# Patient Record
Sex: Male | Born: 1960
Health system: Southern US, Community
[De-identification: ages and names within clinical notes are randomized; demographics above are authoritative.]

## PROBLEM LIST (undated history)

## (undated) DIAGNOSIS — M1990 Unspecified osteoarthritis, unspecified site: Secondary | ICD-10-CM

## (undated) DIAGNOSIS — J441 Chronic obstructive pulmonary disease with (acute) exacerbation: Secondary | ICD-10-CM

## (undated) DIAGNOSIS — M898X9 Other specified disorders of bone, unspecified site: Secondary | ICD-10-CM

## (undated) DIAGNOSIS — F1410 Cocaine abuse, uncomplicated: Secondary | ICD-10-CM

## (undated) DIAGNOSIS — Z992 Dependence on renal dialysis: Secondary | ICD-10-CM

## (undated) DIAGNOSIS — D126 Benign neoplasm of colon, unspecified: Secondary | ICD-10-CM

## (undated) DIAGNOSIS — K626 Ulcer of anus and rectum: Secondary | ICD-10-CM

## (undated) DIAGNOSIS — B1920 Unspecified viral hepatitis C without hepatic coma: Secondary | ICD-10-CM

## (undated) DIAGNOSIS — D649 Anemia, unspecified: Secondary | ICD-10-CM

## (undated) DIAGNOSIS — N19 Unspecified kidney failure: Secondary | ICD-10-CM

## (undated) DIAGNOSIS — I499 Cardiac arrhythmia, unspecified: Secondary | ICD-10-CM

## (undated) DIAGNOSIS — K649 Unspecified hemorrhoids: Secondary | ICD-10-CM

## (undated) DIAGNOSIS — I700 Atherosclerosis of aorta: Secondary | ICD-10-CM

## (undated) DIAGNOSIS — E889 Metabolic disorder, unspecified: Secondary | ICD-10-CM

## (undated) DIAGNOSIS — N186 End stage renal disease: Secondary | ICD-10-CM

## (undated) DIAGNOSIS — B181 Chronic viral hepatitis B without delta-agent: Secondary | ICD-10-CM

## (undated) DIAGNOSIS — I517 Cardiomegaly: Secondary | ICD-10-CM

## (undated) DIAGNOSIS — J449 Chronic obstructive pulmonary disease, unspecified: Secondary | ICD-10-CM

## (undated) DIAGNOSIS — K219 Gastro-esophageal reflux disease without esophagitis: Secondary | ICD-10-CM

## (undated) DIAGNOSIS — I050 Rheumatic mitral stenosis: Secondary | ICD-10-CM

## (undated) DIAGNOSIS — J811 Chronic pulmonary edema: Secondary | ICD-10-CM

## (undated) DIAGNOSIS — F419 Anxiety disorder, unspecified: Secondary | ICD-10-CM

## (undated) DIAGNOSIS — Z87442 Personal history of urinary calculi: Secondary | ICD-10-CM

## (undated) DIAGNOSIS — R079 Chest pain, unspecified: Secondary | ICD-10-CM

## (undated) DIAGNOSIS — I2510 Atherosclerotic heart disease of native coronary artery without angina pectoris: Secondary | ICD-10-CM

## (undated) DIAGNOSIS — M9080 Osteopathy in diseases classified elsewhere, unspecified site: Secondary | ICD-10-CM

## (undated) DIAGNOSIS — N289 Disorder of kidney and ureter, unspecified: Secondary | ICD-10-CM

## (undated) DIAGNOSIS — I10 Essential (primary) hypertension: Secondary | ICD-10-CM

## (undated) DIAGNOSIS — B1910 Unspecified viral hepatitis B without hepatic coma: Secondary | ICD-10-CM

## (undated) DIAGNOSIS — I219 Acute myocardial infarction, unspecified: Secondary | ICD-10-CM

## (undated) DIAGNOSIS — J189 Pneumonia, unspecified organism: Secondary | ICD-10-CM

## (undated) DIAGNOSIS — E875 Hyperkalemia: Secondary | ICD-10-CM

## (undated) HISTORY — DX: Other specified disorders of bone, unspecified site: M89.8X9

## (undated) HISTORY — DX: Chronic pulmonary edema: J81.1

## (undated) HISTORY — DX: Osteopathy in diseases classified elsewhere, unspecified site: M90.80

## (undated) HISTORY — DX: Ulcer of anus and rectum: K62.6

## (undated) HISTORY — DX: Chronic viral hepatitis B without delta-agent: B18.1

## (undated) HISTORY — DX: Essential (primary) hypertension: I10

## (undated) HISTORY — DX: Hyperkalemia: E87.5

## (undated) HISTORY — DX: Cocaine abuse, uncomplicated: F14.10

## (undated) HISTORY — DX: Metabolic disorder, unspecified: E88.9

## (undated) HISTORY — DX: Benign neoplasm of colon, unspecified: D12.6

## (undated) HISTORY — DX: Anemia, unspecified: D64.9

## (undated) HISTORY — PX: SKIN GRAFT SPLIT THICKNESS LEG / FOOT: SUR1303

## (undated) HISTORY — PX: COLONOSCOPY: SHX174

## (undated) HISTORY — DX: Unspecified osteoarthritis, unspecified site: M19.90

## (undated) HISTORY — DX: Chest pain, unspecified: R07.9

## (undated) HISTORY — DX: Atherosclerosis of aorta: I70.0

## (undated) HISTORY — PX: HEMORRHOID BANDING: SHX5850

## (undated) HISTORY — PX: POLYPECTOMY: SHX149

## (undated) HISTORY — DX: Unspecified hemorrhoids: K64.9

## (undated) HISTORY — DX: Unspecified viral hepatitis C without hepatic coma: B19.20

## (undated) HISTORY — DX: Unspecified kidney failure: N19

## (undated) HISTORY — DX: Cardiomegaly: I51.7

## (undated) NOTE — *Deleted (*Deleted)
[facility] Discharge Summary  Patient name: [name] Medical record number: [id number] Date of birth: 04/23/1961 Age: 87 y.o. Gender: male Date of Admission: 05/19/2020  Date of Discharge: 05/22/20 Admitting Physician: Wilber Oliphant, MD  Primary Care Provider: Patient, No Pcp Per Consultants: Nephro  Indication for Hospitalization: Dyspnea, Hyperkalemia  Discharge Diagnoses/Problem List:  ESRD on HD M/W/F, PAF with RVR, HTN, HLD, pulmonary HTN, COPD, tobacco use, Hep B/C, Cirrhosis with recurrent ascites, CAD, anemia of chronic disease and dietary/medication noncompliance.  Disposition: Able to be discharged home safely  Discharge Condition: Stable  Discharge Exam:   Physical Exam Constitutional:      Appearance: Normal appearance.  HENT:     Head: Normocephalic and atraumatic.  Cardiovascular:     Rate and Rhythm: Normal rate and regular rhythm.     Pulses: Normal pulses.  Pulmonary:     Effort: Pulmonary effort is normal.     Breath sounds: Normal breath sounds.  Abdominal:     General: Abdomen is flat. Bowel sounds are normal. There is no distension.     Palpations: Abdomen is soft.     Tenderness: There is no abdominal tenderness.  Musculoskeletal:     Right lower leg: No edema.     Left lower leg: No edema.  Skin:    General: Skin is warm.  Neurological:     General: No focal deficit present.     Mental Status: He is alert.     Brief Hospital Course:  [name]  is a 45 y.o. male presenting with shortness of breath and found to have hyperkalemia with EKG changes. PMH is significant for ESRD on HD M/W/F, PAF with RVR, HTN, HLD, pulmonary HTN, COPD, tobacco use, Hep B/C, Cirrhosis with recurrent ascites, CAD, anemia of chronic disease and dietary/medication noncompliance.  Hyperkalemia with EKG changes  Hx of ESRD on HD M/W/F Initially had K of 6.9.  Evidence of Peaked T waves on EKG.  Had missed/left early  few dialysis appointments.  He was given calcium chloride 1g in the ED as well as insulin/dextrose.  Hyperkalemia resolved after first Dialysis treatment and remained in normal range throughout rest of hospital stay.  Was normal on time of discharge, and T waves less peaked by time of discharge.  Cirrhosis with recurrent ascites Patient has history of Chronic Hepatitis B.  Has significant ascites and received therapeutic Paracentesis.  Patient on Prophylactic daily Bactrim and was changed to Cefdenir MWF after Dialysis at the recommendation of Pharm given Bactrim's potential for Hyperkalemia and QT prolongation and Cefdenir's coverage profile. Patient had therapeutic paracentesis while hospitalized where they removed 3.8 L of fluid.  No Perotineal signs during hospital stay.  Dyspnea  Hx of COPD, Tobacco Use Patint initally came in complaining of dyspnea.  Chest X-Ray obtained that showed mild vascular congestion.  O2 Sats remained > 90 on RA.  Treated with Albuterol 2 puffs q4hr PRN.  Dyspnea resolved with first dialysis treatment.  Paroxysmal A-Fib with RVR Patient initially came in with A-Fib with RVR after missing a few doses of his Metoprolol, Amiodarone and Diltiazem.  Was restarted on medications, but continued to be tachycardic for a day after dialysis.  A CTA was obtained due to rule out PE as possible cause for continued tachycardia. By 11/11 EKG was obtained showing rate-controlled Atrial Fibrillation.   Issues for Follow Up:  1. SBP Prophylaxis- Changed anitbiotics from Bactrim to Cefdenir MWF after Dialysis [facility] Course)  2. Paracentesis-  F/u up with PCP provider to schedule future therapeutic paracenteses appointnments.   Significant Procedures: Hemodialysis, Paracentesis  Significant Labs and Imaging:  Recent Labs  Lab 05/20/20 0914 05/21/20 1643 05/22/20 0838  WBC 7.5 7.2 6.4  HGB 10.4* 10.9* 10.7*  HCT 30.6* 31.5* 30.7*  PLT 126* 147* 121*   Recent Labs  Lab  05/19/20 0934 05/19/20 0934 05/19/20 1750 05/19/20 1750 05/20/20 0226 05/20/20 0226 05/21/20 0834 05/21/20 0834 05/21/20 1643 05/22/20 0838  NA 136   < > 133*  --  135  --  133*  --  136 135  K 6.9*   < > 6.6*   < > 4.6   < > 4.9   < > 3.8 4.3  CL 96*   < > 95*  --  94*  --  94*  --  95* 96*  CO2 26   < > 24  --  27  --  25  --  28 27  GLUCOSE 86   < > 103*  --  75  --  127*  --  102* 106*  BUN 50*   < > 56*  --  28*  --  46*  --  15 24*  CREATININE 9.54*   < > 9.88*  --  6.07*  --  8.60*  --  4.31* 5.98*  CALCIUM 10.4*   < > 10.6*  --  10.0  --  9.9  --  9.7 9.2  MG 2.6*  --   --   --   --   --   --   --   --   --   PHOS  --   --   --   --   --   --   --   --   --  3.9  ALKPHOS 243*  --   --   --   --   --   --   --   --   --   AST 50*  --   --   --   --   --   --   --   --   --   ALT 32  --   --   --   --   --   --   --   --   --   ALBUMIN 3.6  --   --   --   --   --   --   --   --  3.2*   < > = values in this interval not displayed.     Results/Tests Pending at Time of Discharge: Paracentesis cultures  Discharge Medications:  Allergies as of 05/22/2020      Reactions   Tramadol Itching, Other (See Comments)   Grass Extracts [gramineae Pollens]    Other reaction(s): Sneezing   Morphine And Related Other (See Comments)   Stomach pain   Pollen Extract Other (See Comments)   Other reaction(s): Sneezing (finding)   Acetaminophen Nausea Only   Stomach ache   Aspirin Itching, Other (See Comments)   STOMACH PAIN   Clonidine Derivatives Itching      Medication List    STOP taking these medications   sulfamethoxazole-trimethoprim 800-160 MG tablet Commonly known as: BACTRIM DS     TAKE these medications   albuterol 108 (90 Base) MCG/ACT inhaler Commonly known as: VENTOLIN HFA Inhale 2 puffs into the lungs every 4 (four) hours as needed for wheezing or shortness of  breath.   amiodarone 200 MG tablet Commonly known as: PACERONE Take 1 tablet (200 mg total) by  mouth daily.   cefdinir 300 MG capsule Commonly known as: OMNICEF Take 1 capsule (300 mg total) by mouth every Monday, Wednesday, and Friday at 6 PM. Start taking on: May 23, 2020 Notes to patient: **NEW** To prevent infection in the belly. Take AFTER DIALYSIS. Replaces Bactrim- stop taking Bactrim.     diltiazem 120 MG 24 hr capsule Commonly known as: CARDIZEM CD Take 1 capsule (120 mg total) by mouth daily.   diphenhydrAMINE 25 mg capsule Commonly known as: BENADRYL Take 1 capsule (25 mg total) by mouth every 6 (six) hours as needed for itching or allergies.   gabapentin 300 MG capsule Commonly known as: NEURONTIN Take 300 mg by mouth in the morning and at bedtime.   metoprolol succinate 25 MG 24 hr tablet Commonly known as: Toprol XL Take 1 tablet (25 mg total) by mouth daily.   ondansetron 4 MG tablet Commonly known as: Zofran Take 1 tablet (4 mg total) by mouth every 8 (eight) hours as needed for nausea or vomiting.   Oxycodone HCl 10 MG Tabs Take 10 mg by mouth 3 (three) times daily as needed (pain).   pantoprazole 40 MG tablet Commonly known as: PROTONIX Take 1 tablet (40 mg total) by mouth 2 (two) times daily.   sevelamer carbonate 800 MG tablet Commonly known as: RENVELA Take 2,400 mg by mouth See admin instructions. Take 3 tablets (2400 mg) by mouth up to three times daily with meals   tenofovir 300 MG tablet Commonly known as: Viread Take 1 by mouth every Monday. What changed:   how much to take  how to take this  when to take this  additional instructions            Durable Medical Equipment  (From admission, onward)         Start     Ordered   05/22/20 1233  For home use only DME 3 n 1  Once        05/22/20 1232          Discharge Instructions: Please refer to Patient Instructions section of EMR for full details.  Patient was counseled important signs and symptoms that should prompt return to medical care, changes in medications,  dietary instructions, activity restrictions, and follow up appointments.   Follow-Up Appointments:     Delora Fuel, MD 05/22/2020, 1:09 PM PGY-1, [place]

---

## 1998-01-31 ENCOUNTER — Emergency Department (HOSPITAL_COMMUNITY): Admission: EM | Admit: 1998-01-31 | Discharge: 1998-01-31 | Payer: Self-pay | Admitting: Emergency Medicine

## 1999-02-03 ENCOUNTER — Emergency Department (HOSPITAL_COMMUNITY): Admission: EM | Admit: 1999-02-03 | Discharge: 1999-02-03 | Payer: Self-pay | Admitting: Emergency Medicine

## 1999-11-26 ENCOUNTER — Encounter: Payer: Self-pay | Admitting: Emergency Medicine

## 1999-11-26 ENCOUNTER — Emergency Department (HOSPITAL_COMMUNITY): Admission: EM | Admit: 1999-11-26 | Discharge: 1999-11-26 | Payer: Self-pay | Admitting: Emergency Medicine

## 1999-12-25 ENCOUNTER — Emergency Department (HOSPITAL_COMMUNITY): Admission: EM | Admit: 1999-12-25 | Discharge: 1999-12-25 | Payer: Self-pay | Admitting: Emergency Medicine

## 2000-06-27 ENCOUNTER — Emergency Department (HOSPITAL_COMMUNITY): Admission: EM | Admit: 2000-06-27 | Discharge: 2000-06-27 | Payer: Self-pay | Admitting: Emergency Medicine

## 2000-06-27 ENCOUNTER — Encounter: Payer: Self-pay | Admitting: Emergency Medicine

## 2002-08-22 ENCOUNTER — Encounter: Payer: Self-pay | Admitting: Emergency Medicine

## 2002-08-22 ENCOUNTER — Emergency Department (HOSPITAL_COMMUNITY): Admission: EM | Admit: 2002-08-22 | Discharge: 2002-08-22 | Payer: Self-pay | Admitting: Emergency Medicine

## 2005-06-19 ENCOUNTER — Emergency Department (HOSPITAL_COMMUNITY): Admission: EM | Admit: 2005-06-19 | Discharge: 2005-06-19 | Payer: Self-pay | Admitting: Emergency Medicine

## 2005-07-06 ENCOUNTER — Emergency Department (HOSPITAL_COMMUNITY): Admission: EM | Admit: 2005-07-06 | Discharge: 2005-07-06 | Payer: Self-pay | Admitting: *Deleted

## 2006-12-19 IMAGING — CR DG HAND COMPLETE 3+V*R*
3 series · 3 of 3 positions shown · non-contrast
Comparison: None.

CLINICAL DATA: Laceration to right thumb with chainsaw.
 RIGHT HAND - 3 VIEW:

[x hand pa right]
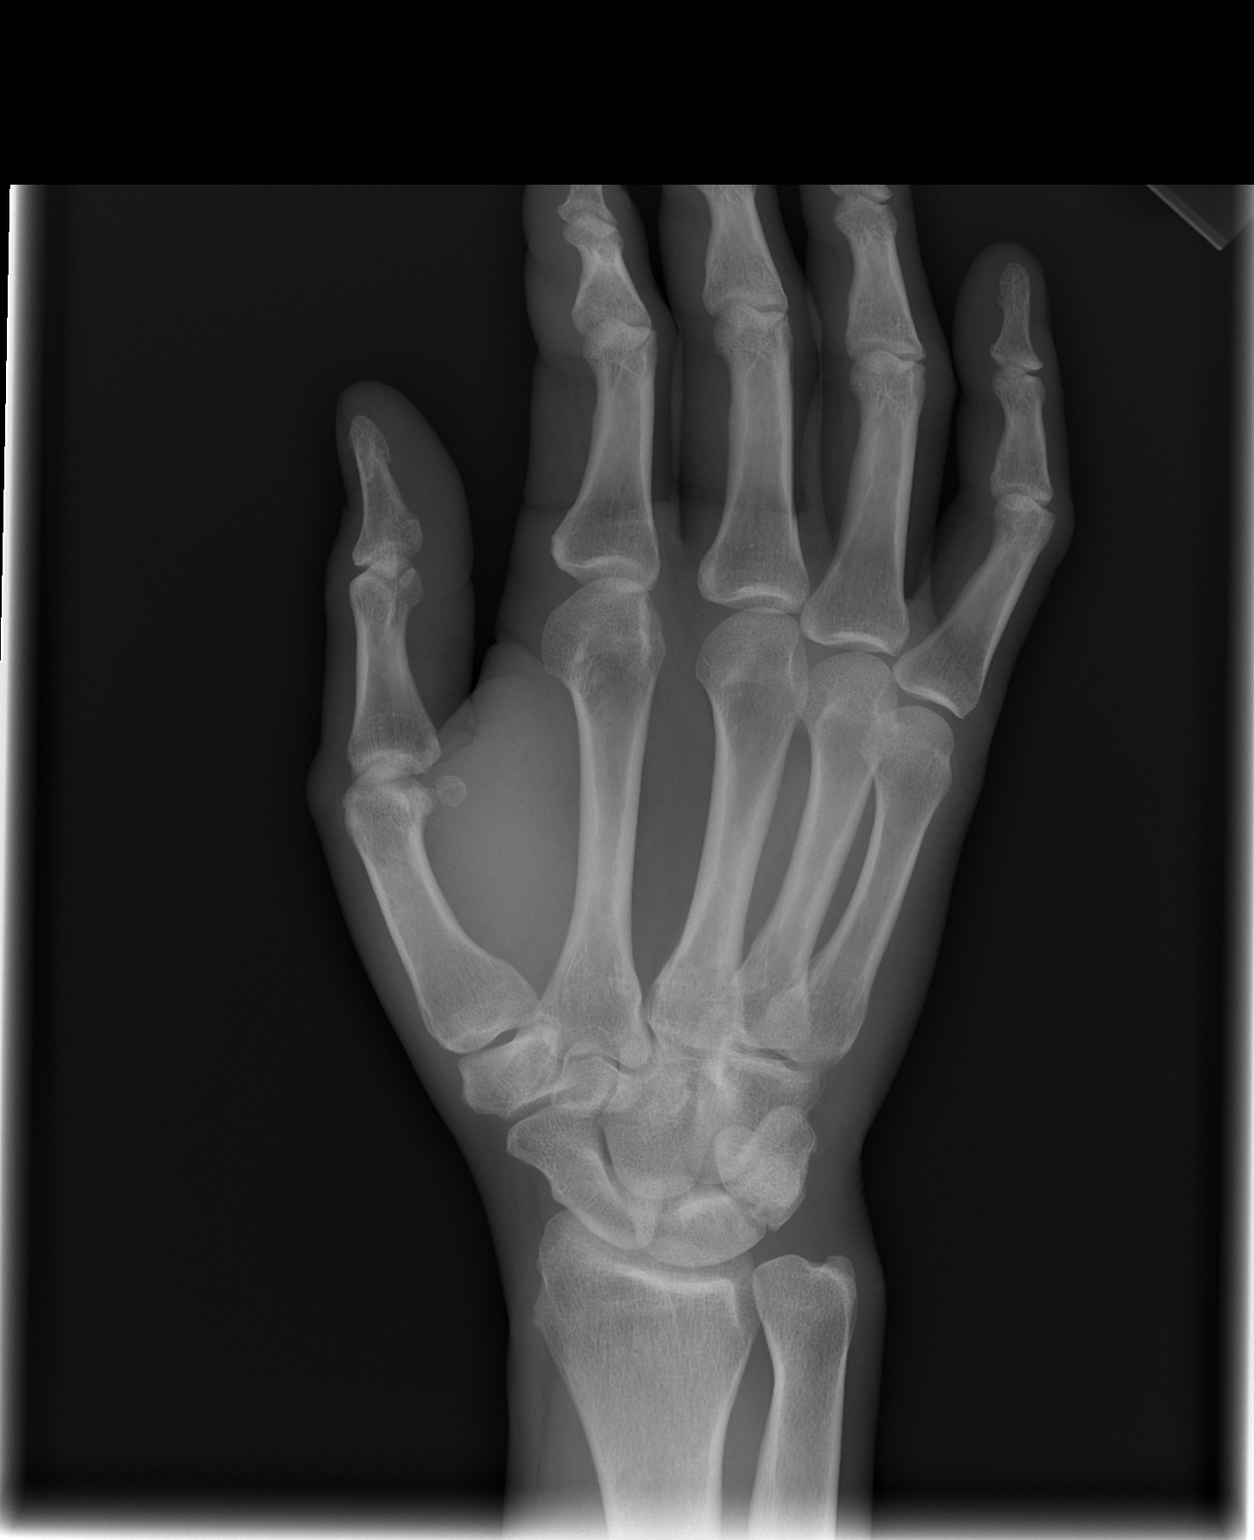

[x hand oblique right]
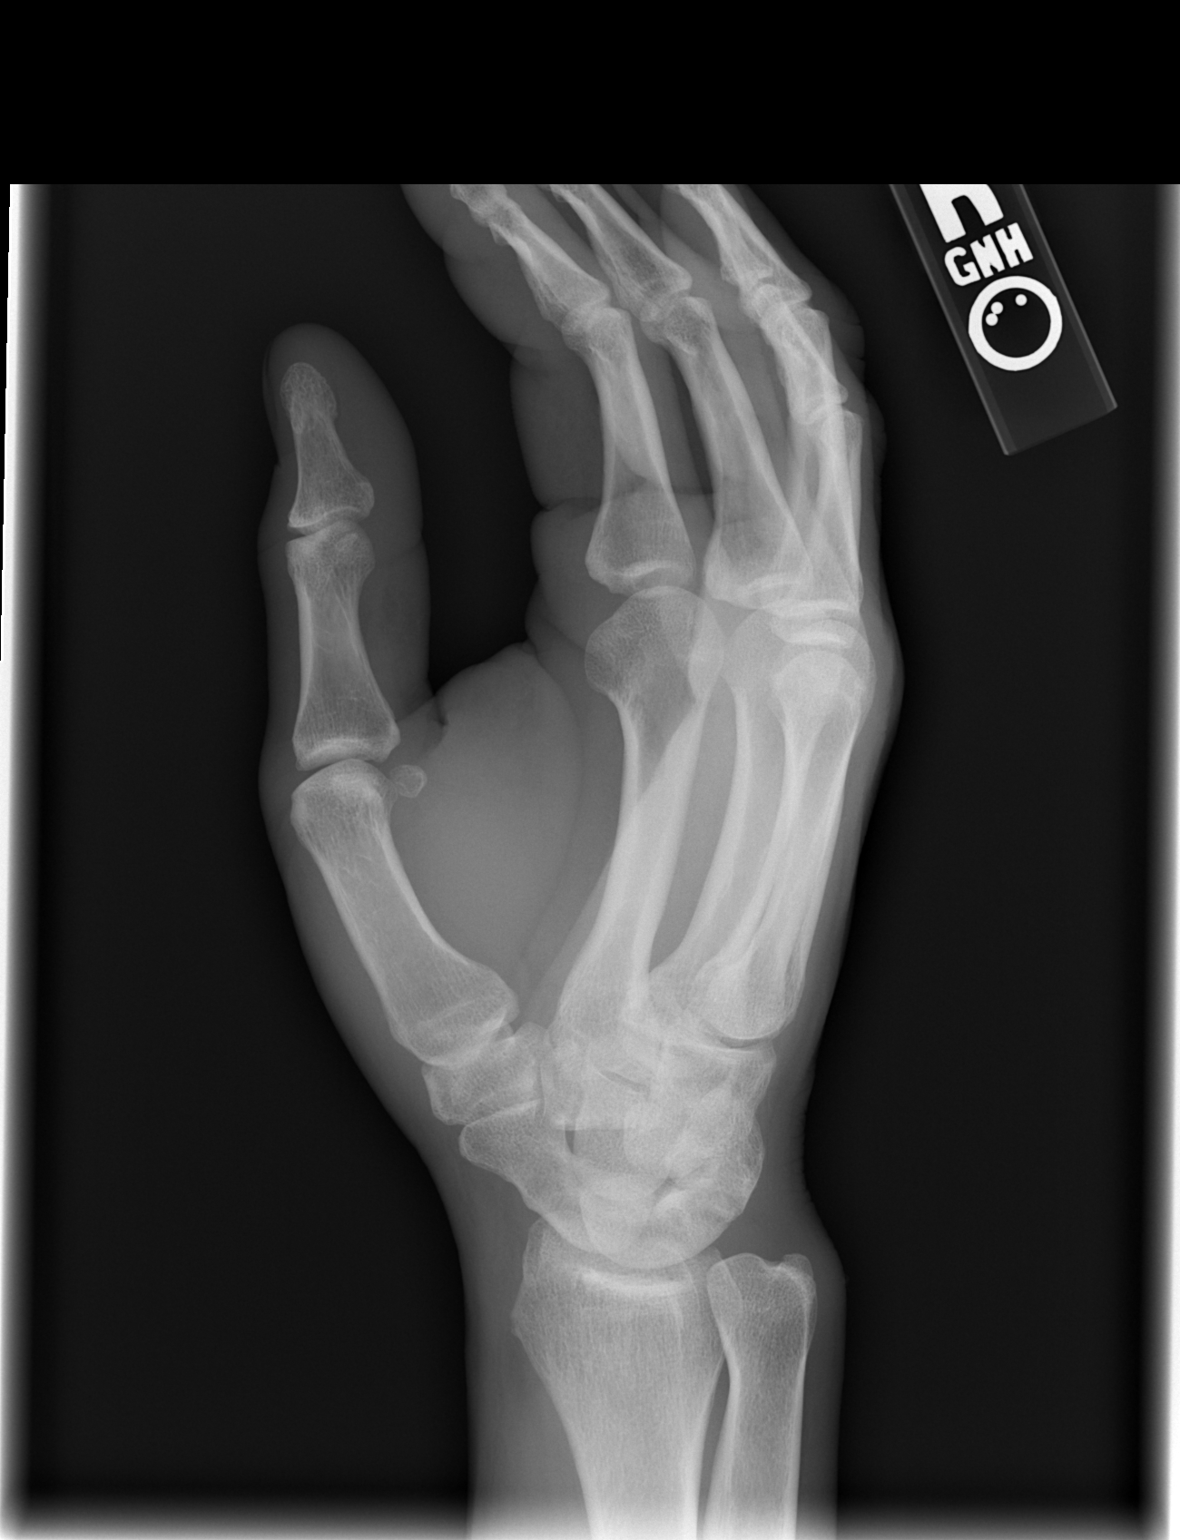

[x hand lat right]
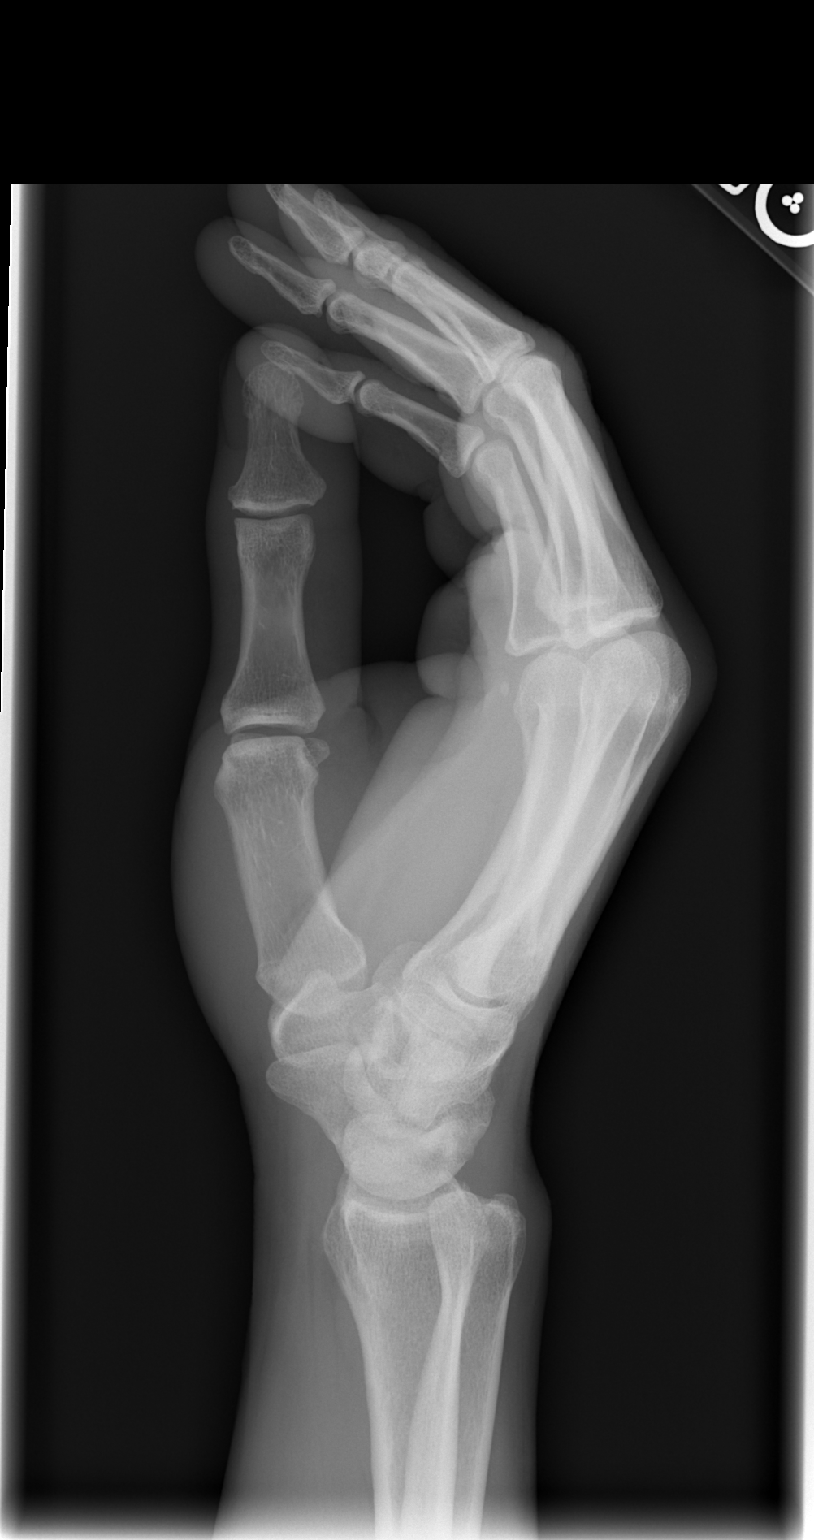

[3 of 3 positions shown; findings below may reference images not displayed]

FINDINGS: No fracture or dislocation.  There appears to be a soft tissue defect in the thenar region.  No foreign body.
IMPRESSION: 1.  No bony abnormality.
 2.  No foreign body in the soft tissues.

## 2008-07-20 ENCOUNTER — Emergency Department (HOSPITAL_COMMUNITY): Admission: EM | Admit: 2008-07-20 | Discharge: 2008-07-20 | Payer: Self-pay | Admitting: Family Medicine

## 2009-01-24 ENCOUNTER — Inpatient Hospital Stay (HOSPITAL_COMMUNITY): Admission: EM | Admit: 2009-01-24 | Discharge: 2009-01-26 | Payer: Self-pay | Admitting: Emergency Medicine

## 2009-01-27 ENCOUNTER — Ambulatory Visit: Payer: Self-pay | Admitting: Internal Medicine

## 2009-02-03 ENCOUNTER — Ambulatory Visit: Payer: Self-pay | Admitting: Internal Medicine

## 2009-02-10 ENCOUNTER — Ambulatory Visit: Payer: Self-pay | Admitting: Internal Medicine

## 2009-02-17 ENCOUNTER — Ambulatory Visit: Payer: Self-pay | Admitting: *Deleted

## 2009-03-18 ENCOUNTER — Emergency Department (HOSPITAL_COMMUNITY): Admission: EM | Admit: 2009-03-18 | Discharge: 2009-03-18 | Payer: Self-pay | Admitting: Emergency Medicine

## 2009-03-28 ENCOUNTER — Ambulatory Visit: Payer: Self-pay | Admitting: Internal Medicine

## 2009-03-31 ENCOUNTER — Encounter: Payer: Self-pay | Admitting: Cardiovascular Disease

## 2009-04-14 DIAGNOSIS — M6740 Ganglion, unspecified site: Secondary | ICD-10-CM | POA: Insufficient documentation

## 2009-04-14 DIAGNOSIS — F1010 Alcohol abuse, uncomplicated: Secondary | ICD-10-CM | POA: Insufficient documentation

## 2009-04-14 DIAGNOSIS — F17210 Nicotine dependence, cigarettes, uncomplicated: Secondary | ICD-10-CM | POA: Insufficient documentation

## 2009-04-14 DIAGNOSIS — F10239 Alcohol dependence with withdrawal, unspecified: Secondary | ICD-10-CM | POA: Insufficient documentation

## 2009-04-14 DIAGNOSIS — R079 Chest pain, unspecified: Secondary | ICD-10-CM | POA: Insufficient documentation

## 2009-04-14 DIAGNOSIS — Z8719 Personal history of other diseases of the digestive system: Secondary | ICD-10-CM | POA: Insufficient documentation

## 2009-04-22 ENCOUNTER — Ambulatory Visit: Payer: Self-pay | Admitting: Cardiovascular Disease

## 2009-04-22 DIAGNOSIS — R072 Precordial pain: Secondary | ICD-10-CM | POA: Insufficient documentation

## 2009-05-05 ENCOUNTER — Ambulatory Visit: Payer: Self-pay

## 2009-05-05 ENCOUNTER — Ambulatory Visit: Payer: Self-pay | Admitting: Cardiology

## 2009-05-05 ENCOUNTER — Ambulatory Visit (HOSPITAL_COMMUNITY): Admission: RE | Admit: 2009-05-05 | Discharge: 2009-05-05 | Payer: Self-pay | Admitting: Cardiovascular Disease

## 2009-05-05 ENCOUNTER — Encounter: Payer: Self-pay | Admitting: Cardiovascular Disease

## 2009-05-08 ENCOUNTER — Telehealth: Payer: Self-pay | Admitting: Cardiovascular Disease

## 2009-06-09 ENCOUNTER — Ambulatory Visit: Payer: Self-pay | Admitting: Family Medicine

## 2009-06-09 LAB — CONVERTED CEMR LAB
ALT: 214 units/L — ABNORMAL HIGH (ref 0–53)
AST: 259 units/L — ABNORMAL HIGH (ref 0–37)
Albumin: 3.4 g/dL — ABNORMAL LOW (ref 3.5–5.2)
Alkaline Phosphatase: 75 units/L (ref 39–117)
BUN: 20 mg/dL (ref 6–23)
CO2: 21 meq/L (ref 19–32)
Calcium: 8.8 mg/dL (ref 8.4–10.5)
Chloride: 108 meq/L (ref 96–112)
Creatinine, Ser: 1.46 mg/dL (ref 0.40–1.50)
Glucose, Bld: 93 mg/dL (ref 70–99)
HCV Ab: REACTIVE — AB
Hep A Total Ab: POSITIVE — AB
Hep B Core Total Ab: POSITIVE — AB
Hep B S Ab: NEGATIVE
Hepatitis B Surface Ag: POSITIVE — AB
Potassium: 4.5 meq/L (ref 3.5–5.3)
Sodium: 140 meq/L (ref 135–145)
Total Bilirubin: 0.5 mg/dL (ref 0.3–1.2)
Total Protein: 7 g/dL (ref 6.0–8.3)

## 2009-06-13 ENCOUNTER — Ambulatory Visit: Payer: Self-pay | Admitting: Internal Medicine

## 2009-06-18 ENCOUNTER — Encounter (INDEPENDENT_AMBULATORY_CARE_PROVIDER_SITE_OTHER): Payer: Self-pay | Admitting: *Deleted

## 2009-06-19 ENCOUNTER — Ambulatory Visit: Payer: Self-pay | Admitting: Internal Medicine

## 2009-07-19 ENCOUNTER — Emergency Department (HOSPITAL_COMMUNITY): Admission: EM | Admit: 2009-07-19 | Discharge: 2009-07-19 | Payer: Self-pay | Admitting: Emergency Medicine

## 2009-07-24 ENCOUNTER — Ambulatory Visit: Payer: Self-pay | Admitting: Cardiovascular Disease

## 2009-07-26 ENCOUNTER — Emergency Department (HOSPITAL_COMMUNITY): Admission: EM | Admit: 2009-07-26 | Discharge: 2009-07-26 | Payer: Self-pay | Admitting: Emergency Medicine

## 2009-07-28 ENCOUNTER — Ambulatory Visit: Payer: Self-pay | Admitting: Internal Medicine

## 2009-07-28 LAB — CONVERTED CEMR LAB: Alcohol, Ethyl (B): <10 mg/dL (ref 0–10)

## 2009-07-31 ENCOUNTER — Emergency Department (HOSPITAL_COMMUNITY): Admission: EM | Admit: 2009-07-31 | Discharge: 2009-07-31 | Payer: Self-pay | Admitting: Emergency Medicine

## 2009-08-27 ENCOUNTER — Ambulatory Visit: Payer: Self-pay | Admitting: Family Medicine

## 2009-08-29 ENCOUNTER — Ambulatory Visit: Payer: Self-pay | Admitting: Family Medicine

## 2009-08-29 ENCOUNTER — Emergency Department (HOSPITAL_COMMUNITY): Admission: EM | Admit: 2009-08-29 | Discharge: 2009-08-29 | Payer: Self-pay | Admitting: Emergency Medicine

## 2009-09-11 ENCOUNTER — Ambulatory Visit: Payer: Self-pay | Admitting: Internal Medicine

## 2009-12-12 ENCOUNTER — Ambulatory Visit: Payer: Self-pay | Admitting: Internal Medicine

## 2009-12-12 LAB — CONVERTED CEMR LAB
BUN: 30 mg/dL — ABNORMAL HIGH (ref 6–23)
CO2: 22 meq/L (ref 19–32)
Calcium: 8.6 mg/dL (ref 8.4–10.5)
Chloride: 107 meq/L (ref 96–112)
Cholesterol: 108 mg/dL (ref 0–200)
Creatinine, Ser: 2.12 mg/dL — ABNORMAL HIGH (ref 0.40–1.50)
Glucose, Bld: 94 mg/dL (ref 70–99)
HDL: 24 mg/dL — ABNORMAL LOW (ref >39)
LDL Cholesterol: 53 mg/dL (ref 0–99)
Potassium: 4.4 meq/L (ref 3.5–5.3)
Sodium: 138 meq/L (ref 135–145)
Total CHOL/HDL Ratio: 4.5
Triglycerides: 154 mg/dL — ABNORMAL HIGH (ref <150)
VLDL: 31 mg/dL (ref 0–40)

## 2009-12-15 ENCOUNTER — Ambulatory Visit: Payer: Self-pay | Admitting: Internal Medicine

## 2009-12-16 ENCOUNTER — Encounter (INDEPENDENT_AMBULATORY_CARE_PROVIDER_SITE_OTHER): Payer: Self-pay | Admitting: Adult Health

## 2010-01-29 ENCOUNTER — Inpatient Hospital Stay (HOSPITAL_COMMUNITY): Admission: EM | Admit: 2010-01-29 | Discharge: 2010-01-31 | Payer: Self-pay | Admitting: Emergency Medicine

## 2010-01-29 ENCOUNTER — Ambulatory Visit: Payer: Self-pay | Admitting: Cardiology

## 2010-01-30 ENCOUNTER — Encounter (INDEPENDENT_AMBULATORY_CARE_PROVIDER_SITE_OTHER): Payer: Self-pay | Admitting: Internal Medicine

## 2010-07-12 HISTORY — PX: AV FISTULA PLACEMENT: SHX1204

## 2010-07-26 IMAGING — CR DG HAND COMPLETE 3+V*R*
3 series · 3 of 3 positions shown · non-contrast
Comparison: None.

CLINICAL DATA: Spider bite.  Pain and swelling of the hand.

RIGHT HAND - COMPLETE 3+ VIEW

[x hand pa right]
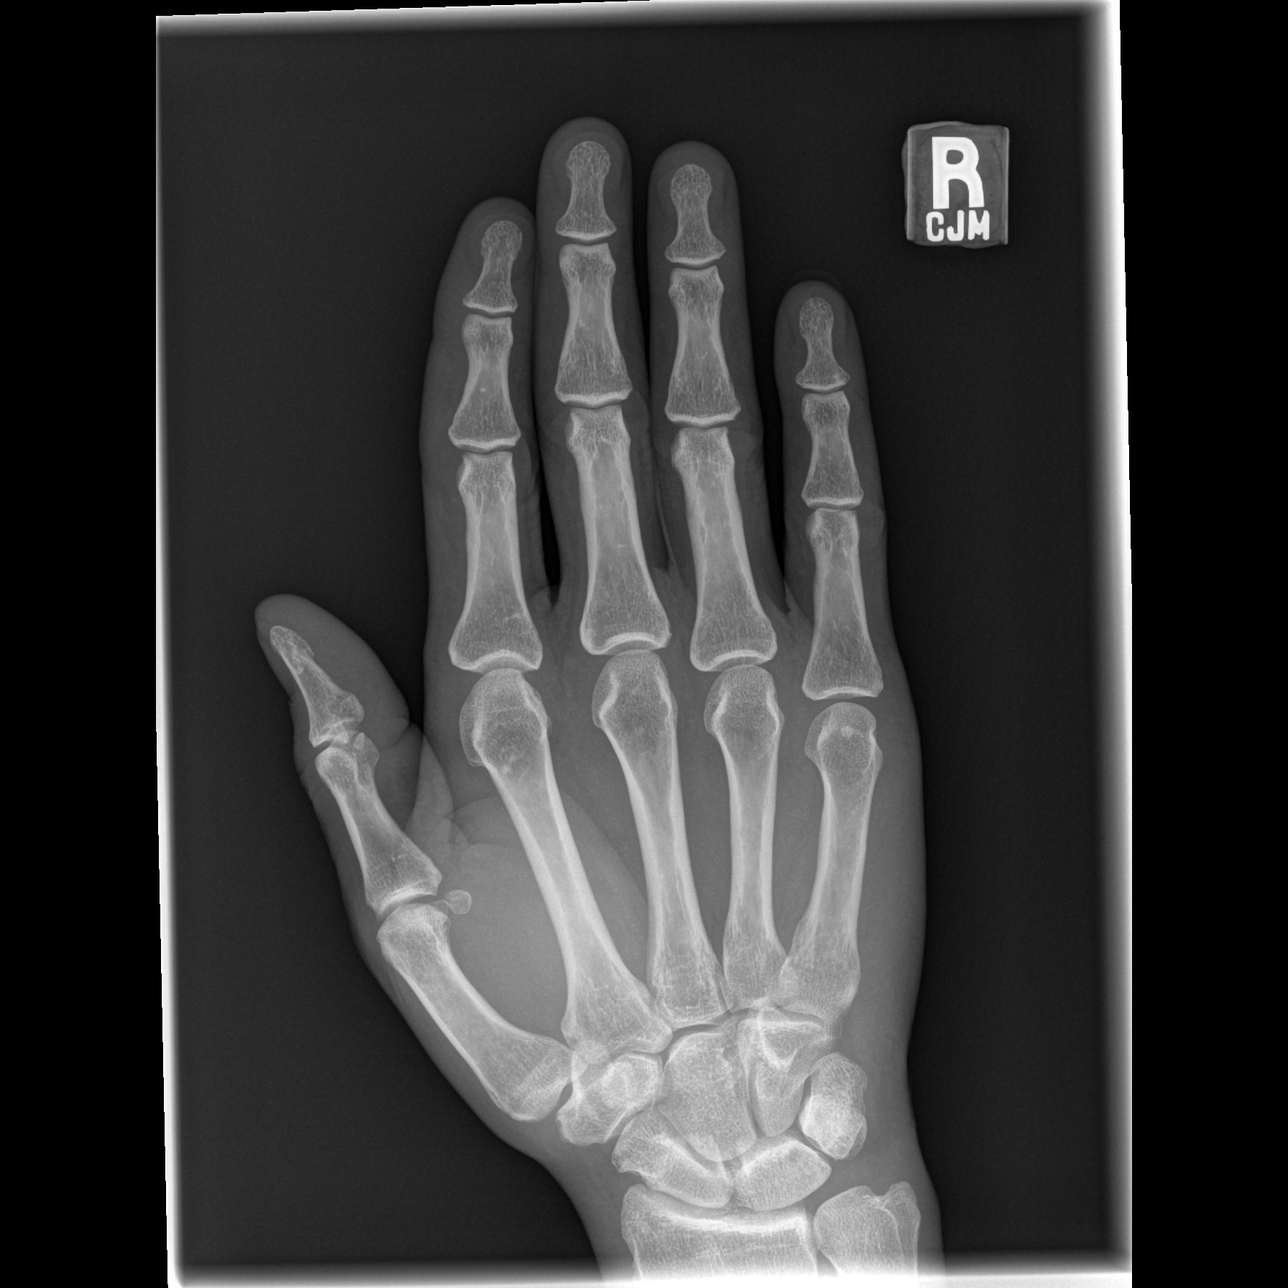

[x hand oblique right]
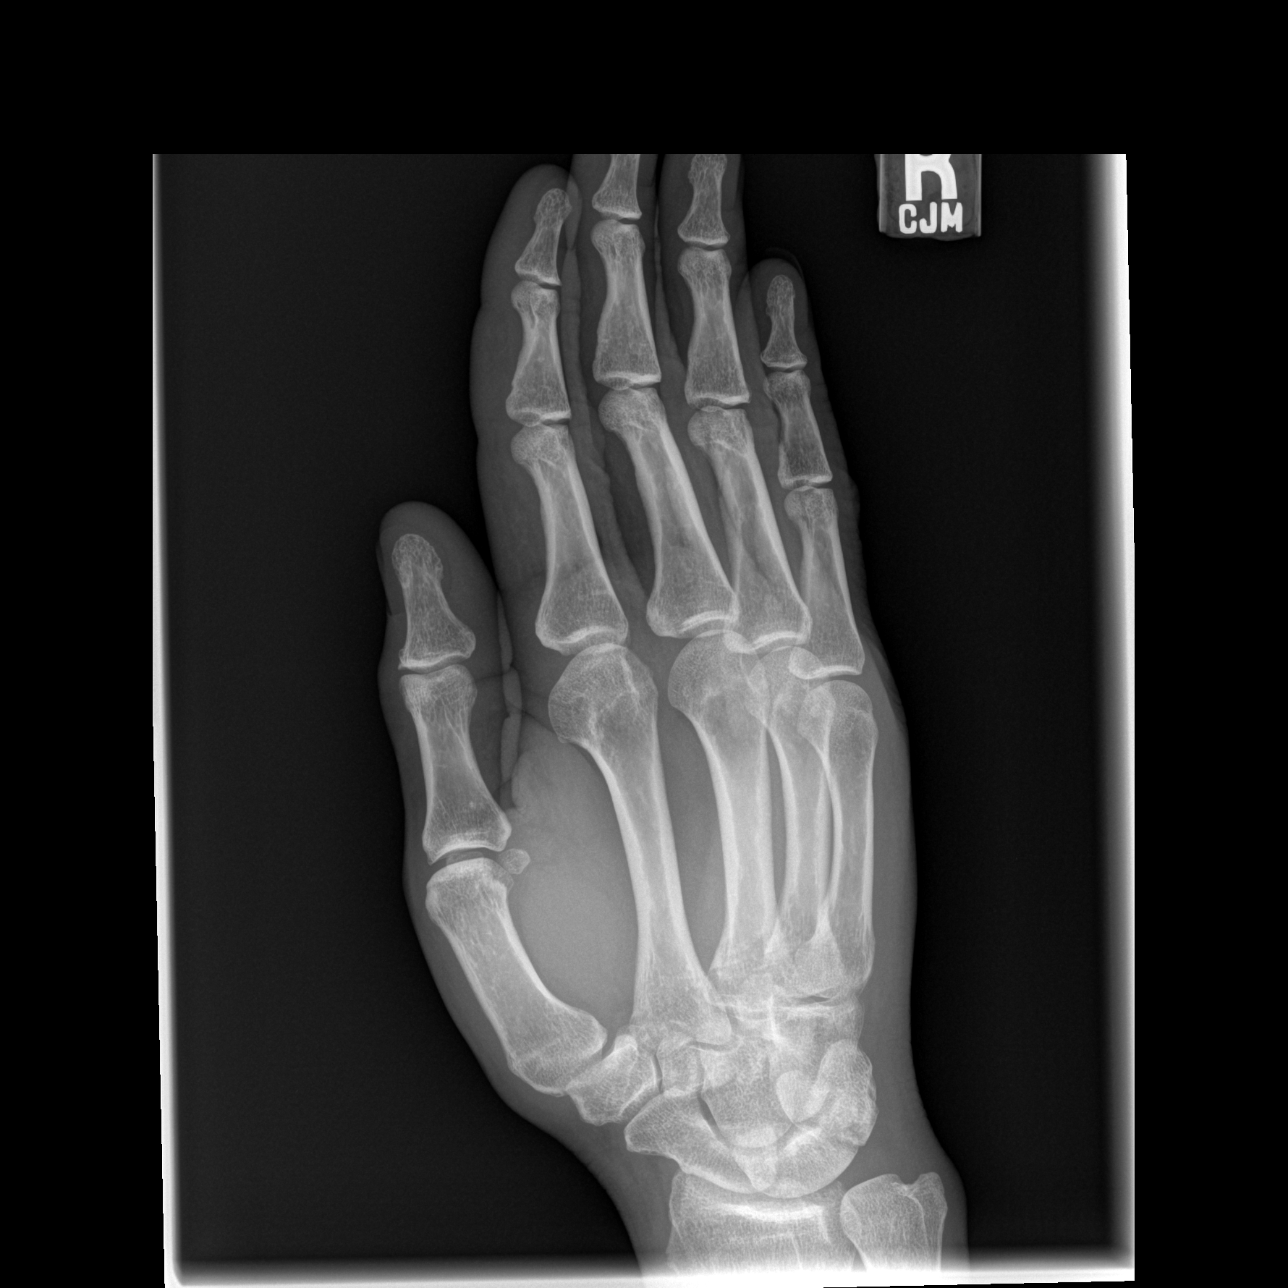

[x hand lat right]
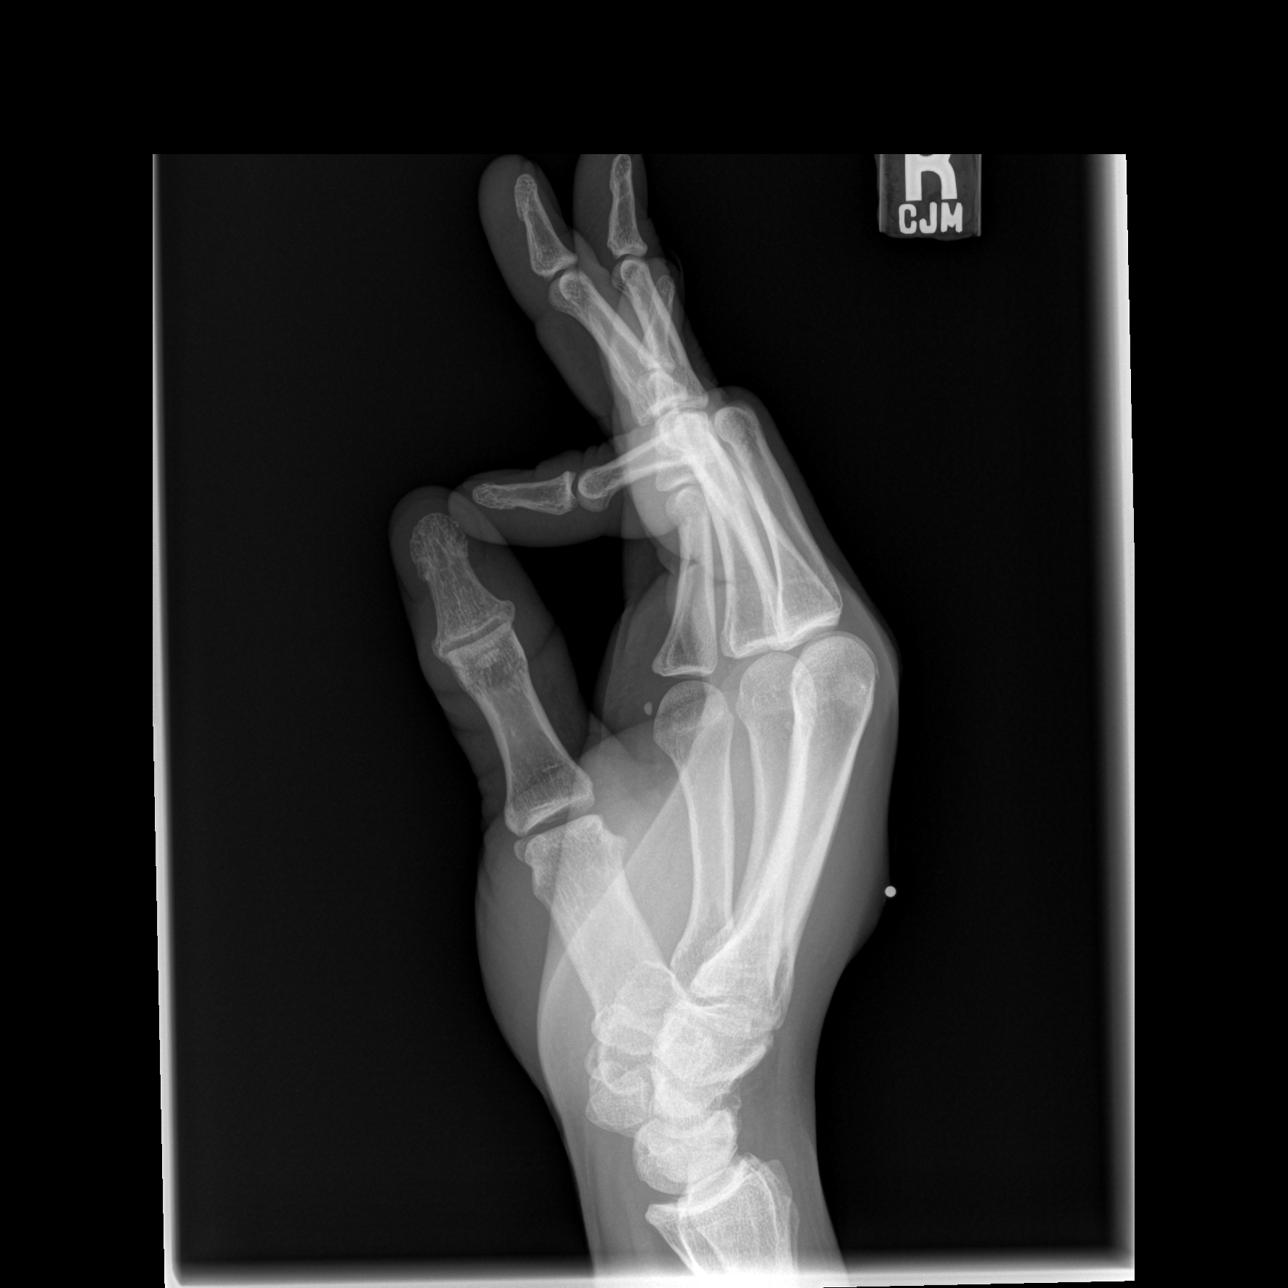

[3 of 3 positions shown; findings below may reference images not displayed]

FINDINGS: Three-view exam shows no evidence for an acute fracture.
Radiopaque marker has been placed on the scan at the level of the
body to.  There is focal soft tissue swelling, but no underlying
bony abnormality is evident.  No gas is seen within the soft
tissues
IMPRESSION: No acute bony abnormality.

## 2010-08-13 NOTE — Assessment & Plan Note (Signed)
Summary: F6W/JSS   Visit Type:  Follow-up Primary Provider:  [facility]  CC:  sob w/stairs..no other complaints today.  History of Present Illness: 50 yo AAM with history of tobacco abuse, HTN and heavy alcohol abuse here today for followe up. He was seen in October for evaluatoin of chest pain. The pain was described as left sided chest pain that radiates into the neck and left arm, lasts up to 2 hours. This is associated with nausea and SOB. There  is no diaphoresis,  near syncope, syncope or lower ext edema. He had been having the chest pain for several months. The pain is not exertional. He was admitted to [facility] in July 2010 with a spider bite and was found to have hypertensive urgency with BP of 232/119.  I saw him in October and ordered a stress echo. He missed his appt with me in November. He has had no recurrence of his chest pain. He continues to smoke and drink daily. He has plans to enter detox this week at [facility]. He tells me that he has cramping all over at night and he is still getting short of breath while climbing stairs.   Current Medications (verified): 1)  Lisinopril 40 Mg Tabs (Lisinopril) .Marland Kitchen.. 1 Tab Once Daily 2)  Amlodipine Besylate 10 Mg Tabs (Amlodipine Besylate) .... Take One Tablet By Mouth Daily 3)  Aspirin 81 Mg  Tabs (Aspirin) .... Daily 4)  Protonix 40 Mg Tbec (Pantoprazole Sodium) .... Daily 5)  Coreg 6.25 Mg Tabs (Carvedilol) .... Take One Tablet Twice Daily 6)  Vistaril 25 Mg Caps (Hydroxyzine Pamoate) .Marland Kitchen.. 1 Tab Once Daily  Allergies (verified): No Known Drug Allergies  Past History:  Past Medical History: Reviewed history from 04/14/2009 and no changes required. CHEST PAIN-UNSPECIFIED (ICD-786.50) HYPERTENSION/ACCELERATED (ICD-401.9) TOBACCO ABUSE (ICD-305.1) HEPATITIS B, HX OF (Q000111Q) ALCOHOLIC HEPATITIS, HX OF (ICD-V12.79) ALCOHOL WITHDRAWAL (ICD-291.81) ALCOHOL ABUSE (ICD-305.00) GANGLION CYST (ICD-727.43)    Social  History: Reviewed history from 04/22/2009 and no changes required. The patient is a smoker.  He states that he smokes less   than 1 pack of cigarettes daily for 35 years.  He also reports that he drinks heavily.  He drinks 1 pint or more of alcohol.  No illicit drugs. 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  The patient denies fatigue, malaise, fever, weight gain/loss, vision loss, decreased hearing, hoarseness, chest pain, palpitations, prolonged cough, wheezing, sleep apnea, coughing up blood, abdominal pain, blood in stool, nausea, vomiting, diarrhea, heartburn, incontinence, blood in urine, muscle weakness, joint pain, leg swelling, rash, skin lesions, headache, fainting, dizziness, depression, anxiety, enlarged lymph nodes, easy bruising or bleeding, and environmental allergies.    Vital Signs:  Patient profile:   50 year old male Height:      71 inches Weight:      163 pounds BMI:     22.82 Pulse rate:   80 / minute Pulse rhythm:   regular BP sitting:   120 / 70  (left arm) Cuff size:   large  Vitals Entered By: Julaine Hua, CMA (July 24, 2009 11:02 AM)  Physical Exam  General:  General: Well developed, well nourished, NAD Psychiatric: Mood and affect normal Neck: No JVD, no carotid bruits, no thyromegaly, no lymphadenopathy. Lungs:Clear bilaterally, no wheezes, rhonci, crackles CV: RRR no murmurs, gallops rubs Abdomen: soft, NT, ND, BS present Extremities: No edema, pulses 2+.  Stress Echocardiogram  Procedure date:  05/05/2009  Findings:      Study Conclusions            - Stress ECG conclusions: The stress ECG was normal.     - Staged echo: Normal echo stress     - Impressions: Hypertensive blood pressure response.     Impressions:            - Hypertensive blood pressure response.  Impression & Recommendations:  Problem # 1:  CHEST PAIN-PRECORDIAL (ICD-786.51) Stress test without  ischemia. No further ischemic workup at this time. I have counseled him on the importance of tobacco cessation and alcohol cesation. He needs to have a workup for his SOB to include chest xray and possibly PFTs. [facility] will [facility] to arrange this. He is asked to arrange a follow up primary care visit.   His updated medication list for this problem includes:    Lisinopril 40 Mg Tabs (Lisinopril) .Marland Kitchen... 1 tab once daily    Amlodipine Besylate 10 Mg Tabs (Amlodipine besylate) .Marland Kitchen... Take one tablet by mouth daily    Aspirin 81 Mg Tabs (Aspirin) .Marland Kitchen... Daily    Coreg 6.25 Mg Tabs (Carvedilol) .Marland Kitchen... Take one tablet twice daily  Patient Instructions: 1)  Your physician recommends that you schedule a follow-up appointment as needed 2)  Your physician recommends that you continue on your current medications as directed. Please refer to the Current Medication list given to you today.

## 2010-08-13 NOTE — Consult Note (Signed)
Summary: [facility] Referral Form  [facility] Referral Form   Imported By: Sallee Provencal 04/29/2009 15:30:08  _____________________________________________________________________  External Attachment:    Type:   Image     Comment:   External Document

## 2010-08-13 NOTE — Assessment & Plan Note (Signed)
Summary: np6/htn/episodic chest pain/eval for stress test   Visit Type:  Initial Consult Primary Provider:  [facility]  CC:  chest pain.  History of Present Illness: 50 yo AAM with history of tobacco abuse, HTN and heavy alcohol abuse here today for evaluatoin of chest pain. The pain is left sided chest pain that radiates into the neck and left arm, lasts up to 2 hours. This is associated with nausea and SOB. There  is no diaphoresis,  near syncope, syncope or lower ext edema. He has been having the chest pain for several months. The pain is not exertional. He was admitted to [facility] in July 2010 with a spider bite and was found to have hypertensive urgency with BP of 232/119. He was discharged home on clonidine and Lisinopril but this was changed at some point over the last few months with the clonidine being stopped and Norvasc 5 mg per day was added. He continues to smoke one pack of cigarettes per day and drinks alcohol daily in large amounts.   Current Medications (verified): 1)  Lisinopril 40 Mg Tabs (Lisinopril) .Marland Kitchen.. 1 Tab Once Daily 2)  Amlodipine Besylate 5 Mg Tabs (Amlodipine Besylate) .... Daily 3)  Aspirin 81 Mg  Tabs (Aspirin) .... Daily 4)  Protonix 40 Mg Tbec (Pantoprazole Sodium) .... Daily  Allergies (verified): No Known Drug Allergies  Past History:  Past Medical History: Reviewed history from 04/14/2009 and no changes required. CHEST PAIN-UNSPECIFIED (ICD-786.50) HYPERTENSION/ACCELERATED (ICD-401.9) TOBACCO ABUSE (ICD-305.1) HEPATITIS B, HX OF (Q000111Q) ALCOHOLIC HEPATITIS, HX OF (ICD-V12.79) ALCOHOL WITHDRAWAL (ICD-291.81) ALCOHOL ABUSE (ICD-305.00) GANGLION CYST (ICD-727.43)    Past Surgical History: Reviewed history from 04/14/2009 and no changes required.  None.   Family History: Mother deceased, lung cancer Father, alive with heart disease Grandmother paternal with CAD  Social History: The patient is a smoker.  He states that he smokes less     than 1 pack of cigarettes daily for 35 years.  He also reports that he drinks heavily.  He drinks 1 pint or more of alcohol.  No illicit drugs. 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  The patient denies fatigue, malaise, fever, weight gain/loss, palpitations, prolonged cough, wheezing, sleep apnea, coughing up blood, abdominal pain, blood in stool, vomiting, diarrhea, heartburn, incontinence, blood in urine, muscle weakness, joint pain, leg swelling, rash, skin lesions, headache, fainting, dizziness, depression, and anxiety.    Vital Signs:  Patient profile:   50 year old male Height:      71 inches Weight:      158 pounds BMI:     22.12 Pulse rate:   71 / minute Resp:     18 per minute BP sitting:   186 / 104  (right arm)  Vitals Entered By: Levora Angel, CNA (April 22, 2009 10:51 AM)  Physical Exam  General:  General: Well developed, well nourished, NAD HEENT: OP clear, mucus membranes moist SKIN: warm, dry Neuro: No focal deficits Musculoskeletal: Muscle strength 5/5 all ext Psychiatric: Mood and affect normal Neck: No JVD, no carotid bruits, no thyromegaly, no lymphadenopathy. Lungs:Clear bilaterally, no wheezes, rhonci, crackles CV: RRR no murmurs, gallops rubs Abdomen: soft, NT, ND, BS present Extremities: No edema, pulses 2+.    Impression & Recommendations:  Problem # 1:  CHEST PAIN-PRECORDIAL (ICD-786.51) Many atypical features but given his risk factors which include tobacco abuse, uncontrolled HTN and strong FH  of CAD, will proceed to stress testing with stress echo.   His updated medication list for this problem includes:    Lisinopril 40 Mg Tabs (Lisinopril) .Marland Kitchen... 1 tab once daily    Amlodipine Besylate 10 Mg Tabs (Amlodipine besylate) .Marland Kitchen... Take one tablet by mouth daily    Aspirin 81 Mg Tabs (Aspirin) .Marland Kitchen... Daily  Problem # 2:  HYPERTENSION/ACCELERATED  ([id number]) Will increase Norvasc to 10 mg per day and will add Coreg 6.25 mg two times a day. Continue Lisinopril. He should continue to follow in [facility] clinic for his BP control. If it cannot be controlled on medical therapy, will get renal artery dopplers, however, my suspicion is that this is driven by essential HTN with ongoing tobacco and alcohol abuse and possible medication non-compliance.   The following medications were removed from the medication list:    Clonidine Hcl 0.2 Mg Tabs (Clonidine hcl) .Marland Kitchen... 1 tab two times a day His updated medication list for this problem includes:    Lisinopril 40 Mg Tabs (Lisinopril) .Marland Kitchen... 1 tab once daily    Amlodipine Besylate 10 Mg Tabs (Amlodipine besylate) .Marland Kitchen... Take one tablet by mouth daily    Aspirin 81 Mg Tabs (Aspirin) .Marland Kitchen... Daily    Coreg 6.25 Mg Tabs (Carvedilol) .Marland Kitchen... Take one tablet twice daily  Problem # 3:  TOBACCO ABUSE (ICD-305.1) Smoking cessation encouraged. I have also discussed the dangers of continued alcohol abuse. He is to seek help with this if he can make up his mind to stop.   Other Orders: Stress Echo (Stress Echo)  Patient Instructions: 1)  Your physician recommends that you schedule a follow-up appointment in: 3-4 weeks 2)  Your physician has recommended you make the following change in your medication: Increase amlodipine to 10 mg daily. 3)  Start Coreg 6.25 mg two times a day 4)  Your physician has requested that you have a stress echocardiogram. For further information please visit [email or web address].  Please follow instruction sheet as given. Prescriptions: COREG 6.25 MG TABS (CARVEDILOL) take one tablet twice daily  #60 x 6   Entered by:   Thompson Grayer, RN, BSN   Authorized by:   Lauree Chandler, MD   Signed by:   Thompson Grayer, RN, BSN on 04/22/2009   Method used:   Faxed to ...       [facility] (retail)       [place],  [place]  [address]       Ph: [phone] [place]       Fax: [phone]   RxID:   [id number] AMLODIPINE BESYLATE 10 MG TABS (AMLODIPINE BESYLATE) Take one tablet by mouth daily  #30 x 6   Entered by:   Thompson Grayer, RN, BSN   Authorized by:   Lauree Chandler, MD   Signed by:   Thompson Grayer, RN, BSN on 04/22/2009   Method used:   Faxed to ...       [facility] (retail)       [place], [place]  [address]       Ph: [phone]       Fax: [phone]   RxID:   [id number]

## 2010-08-13 NOTE — Letter (Signed)
Summary: Appointment - Missed  [facility], [place]  [address] [place]   [place], [place] [address]   Phone: [phone]  Fax: [phone]     June 18, 2009 MRN: [id number]   [facility] [address] [place], [place]  [address]   Dear [name],  Our records indicate you missed your appointment on June 04, 2009  with Dr. Angelena Form. It is very important that we reach you to reschedule this appointment. We look forward to participating in your health care needs. Please contact us at the number listed above at your earliest convenience to reschedule this appointment.     Sincerely, Public relations account executive

## 2010-08-13 NOTE — Progress Notes (Signed)
Summary: returning call  Phone Note Call from Patient Call back at Home Phone [phone] Call back at [phone]   Caller: Patient Reason for Call: Talk to Nurse Summary of Call: returning call Initial call taken by: Darnell Level,  May 08, 2009 2:13 PM  Follow-up for Phone Call        Patient returned RN's call. Stress echo results given. Pt. verbalized understanding.  Follow-up by: Carollee Sires, RN, BSN,  May 08, 2009 2:45 PM

## 2010-09-17 IMAGING — CR DG CHEST 1V PORT
1 series · 1 of 1 positions shown · non-contrast
Comparison: None available.

CLINICAL DATA: Chest pain.

PORTABLE CHEST - 1 VIEW

[view not recorded]
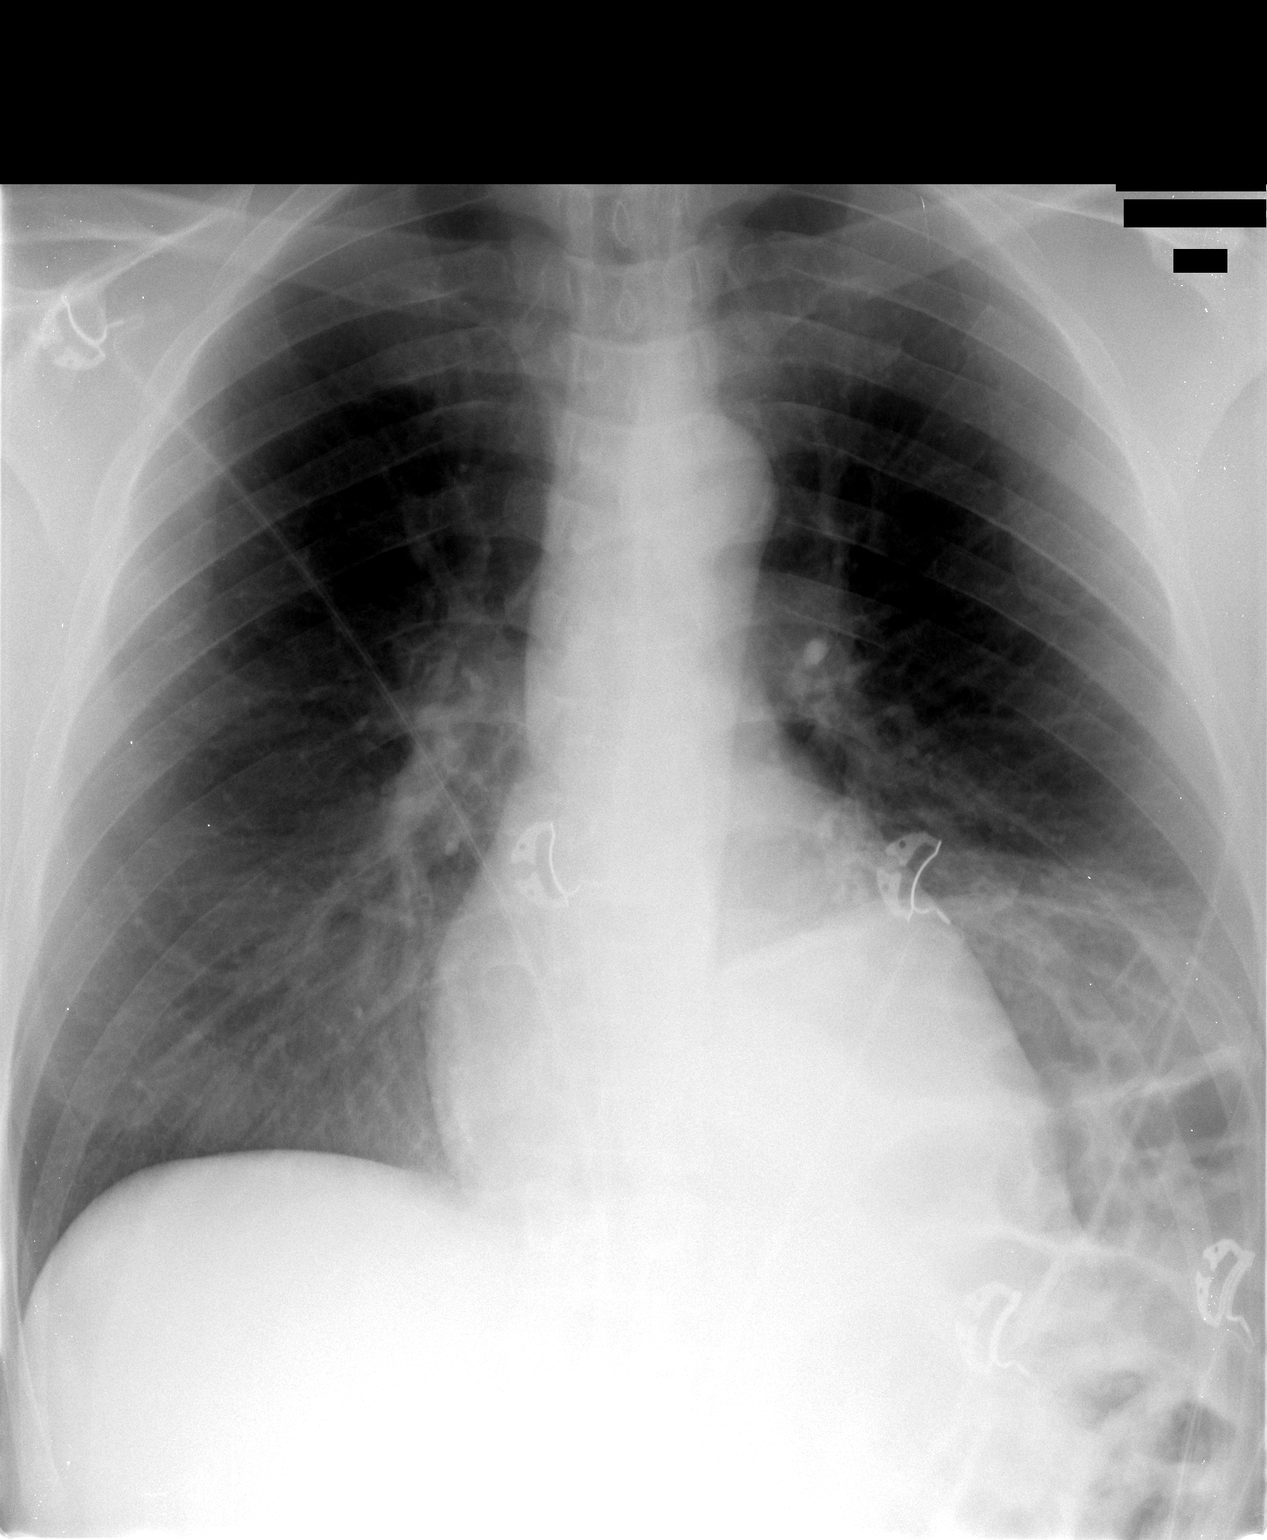

[1 of 1 positions shown; findings below may reference images not displayed]

FINDINGS: There is elevation of the left hemidiaphragm with mild
left basilar atelectasis.  Right lung clear.  Heart size normal.
IMPRESSION: Right basilar subsegmental atelectasis.  Otherwise negative.

## 2010-09-26 LAB — BASIC METABOLIC PANEL
BUN: 17 mg/dL (ref 6–23)
BUN: 18 mg/dL (ref 6–23)
BUN: 18 mg/dL (ref 6–23)
BUN: 24 mg/dL — ABNORMAL HIGH (ref 6–23)
CO2: 22 mEq/L (ref 19–32)
CO2: 25 mEq/L (ref 19–32)
CO2: 28 mEq/L (ref 19–32)
CO2: 28 mEq/L (ref 19–32)
Calcium: 8.2 mg/dL — ABNORMAL LOW (ref 8.4–10.5)
Calcium: 8.3 mg/dL — ABNORMAL LOW (ref 8.4–10.5)
Calcium: 8.6 mg/dL (ref 8.4–10.5)
Calcium: 8.8 mg/dL (ref 8.4–10.5)
Chloride: 104 mEq/L (ref 96–112)
Chloride: 105 mEq/L (ref 96–112)
Chloride: 105 mEq/L (ref 96–112)
Chloride: 106 mEq/L (ref 96–112)
Creatinine, Ser: 1.48 mg/dL (ref 0.4–1.5)
Creatinine, Ser: 1.48 mg/dL (ref 0.4–1.5)
Creatinine, Ser: 1.52 mg/dL — ABNORMAL HIGH (ref 0.4–1.5)
Creatinine, Ser: 2.25 mg/dL — ABNORMAL HIGH (ref 0.4–1.5)
GFR calc Af Amer: 38 mL/min — ABNORMAL LOW (ref >60)
GFR calc Af Amer: 60 mL/min — ABNORMAL LOW (ref >60)
GFR calc Af Amer: >60 mL/min (ref >60)
GFR calc Af Amer: >60 mL/min (ref >60)
GFR calc non Af Amer: 31 mL/min — ABNORMAL LOW (ref >60)
GFR calc non Af Amer: 49 mL/min — ABNORMAL LOW (ref >60)
GFR calc non Af Amer: 51 mL/min — ABNORMAL LOW (ref >60)
GFR calc non Af Amer: 51 mL/min — ABNORMAL LOW (ref >60)
Glucose, Bld: 107 mg/dL — ABNORMAL HIGH (ref 70–99)
Glucose, Bld: 114 mg/dL — ABNORMAL HIGH (ref 70–99)
Glucose, Bld: 117 mg/dL — ABNORMAL HIGH (ref 70–99)
Glucose, Bld: 133 mg/dL — ABNORMAL HIGH (ref 70–99)
Potassium: 3.5 mEq/L (ref 3.5–5.1)
Potassium: 3.5 mEq/L (ref 3.5–5.1)
Potassium: 3.5 mEq/L (ref 3.5–5.1)
Potassium: 3.6 mEq/L (ref 3.5–5.1)
Sodium: 137 mEq/L (ref 135–145)
Sodium: 137 mEq/L (ref 135–145)
Sodium: 138 mEq/L (ref 135–145)
Sodium: 138 mEq/L (ref 135–145)

## 2010-09-26 LAB — CK TOTAL AND CKMB (NOT AT ARMC)
CK, MB: 13.6 ng/mL (ref 0.3–4.0)
CK, MB: 5 ng/mL — ABNORMAL HIGH (ref 0.3–4.0)
CK, MB: 9.7 ng/mL (ref 0.3–4.0)
Relative Index: 0.6 (ref 0.0–2.5)
Relative Index: 0.6 (ref 0.0–2.5)
Relative Index: 0.7 (ref 0.0–2.5)
Total CK: 1681 U/L — ABNORMAL HIGH (ref 7–232)
Total CK: 2287 U/L — ABNORMAL HIGH (ref 7–232)
Total CK: 717 U/L — ABNORMAL HIGH (ref 7–232)

## 2010-09-26 LAB — HEPATIC FUNCTION PANEL
ALT: 153 U/L — ABNORMAL HIGH (ref 0–53)
AST: 308 U/L — ABNORMAL HIGH (ref 0–37)
Albumin: 2.3 g/dL — ABNORMAL LOW (ref 3.5–5.2)
Alkaline Phosphatase: 127 U/L — ABNORMAL HIGH (ref 39–117)
Bilirubin, Direct: 1.3 mg/dL — ABNORMAL HIGH (ref 0.0–0.3)
Indirect Bilirubin: 0.8 mg/dL (ref 0.3–0.9)
Total Bilirubin: 2.1 mg/dL — ABNORMAL HIGH (ref 0.3–1.2)
Total Protein: 6.3 g/dL (ref 6.0–8.3)

## 2010-09-26 LAB — LIPID PANEL
Cholesterol: 164 mg/dL (ref 0–200)
HDL: 30 mg/dL — ABNORMAL LOW (ref >39)
LDL Cholesterol: 80 mg/dL (ref 0–99)
Total CHOL/HDL Ratio: 5.5 RATIO
Triglycerides: 268 mg/dL — ABNORMAL HIGH (ref <150)
VLDL: 54 mg/dL — ABNORMAL HIGH (ref 0–40)

## 2010-09-26 LAB — RAPID URINE DRUG SCREEN, HOSP PERFORMED
Amphetamines: NOT DETECTED
Barbiturates: NOT DETECTED
Benzodiazepines: NOT DETECTED
Cocaine: POSITIVE — AB
Opiates: NOT DETECTED
Tetrahydrocannabinol: POSITIVE — AB

## 2010-09-26 LAB — DIFFERENTIAL
Basophils Absolute: 0.1 10*3/uL (ref 0.0–0.1)
Basophils Relative: 1 % (ref 0–1)
Eosinophils Absolute: 0.1 10*3/uL (ref 0.0–0.7)
Eosinophils Relative: 1 % (ref 0–5)
Lymphocytes Relative: 33 % (ref 12–46)
Lymphs Abs: 2.6 10*3/uL (ref 0.7–4.0)
Monocytes Absolute: 1.2 10*3/uL — ABNORMAL HIGH (ref 0.1–1.0)
Monocytes Relative: 15 % — ABNORMAL HIGH (ref 3–12)
Neutro Abs: 3.9 10*3/uL (ref 1.7–7.7)
Neutrophils Relative %: 50 % (ref 43–77)

## 2010-09-26 LAB — URINALYSIS, ROUTINE W REFLEX MICROSCOPIC
Glucose, UA: NEGATIVE mg/dL
Ketones, ur: NEGATIVE mg/dL
Leukocytes, UA: NEGATIVE
Nitrite: NEGATIVE
Protein, ur: >300 mg/dL — AB
Specific Gravity, Urine: 1.017 (ref 1.005–1.030)
Urobilinogen, UA: 2 mg/dL — ABNORMAL HIGH (ref 0.0–1.0)
pH: 5.5 (ref 5.0–8.0)

## 2010-09-26 LAB — CBC
HCT: 35 % — ABNORMAL LOW (ref 39.0–52.0)
HCT: 36.2 % — ABNORMAL LOW (ref 39.0–52.0)
HCT: 36.3 % — ABNORMAL LOW (ref 39.0–52.0)
Hemoglobin: 12.1 g/dL — ABNORMAL LOW (ref 13.0–17.0)
Hemoglobin: 12.2 g/dL — ABNORMAL LOW (ref 13.0–17.0)
Hemoglobin: 12.5 g/dL — ABNORMAL LOW (ref 13.0–17.0)
MCH: 28.3 pg (ref 26.0–34.0)
MCH: 29.2 pg (ref 26.0–34.0)
MCH: 29.3 pg (ref 26.0–34.0)
MCHC: 33.5 g/dL (ref 30.0–36.0)
MCHC: 34.5 g/dL (ref 30.0–36.0)
MCHC: 34.6 g/dL (ref 30.0–36.0)
MCV: 84.2 fL (ref 78.0–100.0)
MCV: 84.4 fL (ref 78.0–100.0)
MCV: 84.6 fL (ref 78.0–100.0)
Platelets: 115 10*3/uL — ABNORMAL LOW (ref 150–400)
Platelets: 119 10*3/uL — ABNORMAL LOW (ref 150–400)
Platelets: 137 10*3/uL — ABNORMAL LOW (ref 150–400)
RBC: 4.14 MIL/uL — ABNORMAL LOW (ref 4.22–5.81)
RBC: 4.28 MIL/uL (ref 4.22–5.81)
RBC: 4.31 MIL/uL (ref 4.22–5.81)
RDW: 14.9 % (ref 11.5–15.5)
RDW: 15.2 % (ref 11.5–15.5)
RDW: 15.2 % (ref 11.5–15.5)
WBC: 7.6 10*3/uL (ref 4.0–10.5)
WBC: 7.7 10*3/uL (ref 4.0–10.5)
WBC: 7.8 10*3/uL (ref 4.0–10.5)

## 2010-09-26 LAB — URINE MICROSCOPIC-ADD ON

## 2010-09-26 LAB — LIPASE, BLOOD: Lipase: 29 U/L (ref 11–59)

## 2010-09-26 LAB — POCT CARDIAC MARKERS
CKMB, poc: 2.6 ng/mL (ref 1.0–8.0)
Myoglobin, poc: >500 ng/mL (ref 12–200)
Troponin i, poc: <0.05 ng/mL (ref 0.00–0.09)

## 2010-09-26 LAB — TSH: TSH: 1.289 u[IU]/mL (ref 0.350–4.500)

## 2010-09-27 LAB — DIFFERENTIAL
Basophils Absolute: 0 10*3/uL (ref 0.0–0.1)
Basophils Relative: 0 % (ref 0–1)
Eosinophils Absolute: 0.1 10*3/uL (ref 0.0–0.7)
Eosinophils Relative: 2 % (ref 0–5)
Lymphocytes Relative: 35 % (ref 12–46)
Lymphs Abs: 2.8 10*3/uL (ref 0.7–4.0)
Monocytes Absolute: 0.8 10*3/uL (ref 0.1–1.0)
Monocytes Relative: 11 % (ref 3–12)
Neutro Abs: 4.1 10*3/uL (ref 1.7–7.7)
Neutrophils Relative %: 52 % (ref 43–77)

## 2010-09-27 LAB — COMPREHENSIVE METABOLIC PANEL
ALT: 156 U/L — ABNORMAL HIGH (ref 0–53)
ALT: 171 U/L — ABNORMAL HIGH (ref 0–53)
AST: 174 U/L — ABNORMAL HIGH (ref 0–37)
AST: 198 U/L — ABNORMAL HIGH (ref 0–37)
Albumin: 2.7 g/dL — ABNORMAL LOW (ref 3.5–5.2)
Albumin: 2.7 g/dL — ABNORMAL LOW (ref 3.5–5.2)
Alkaline Phosphatase: 115 U/L (ref 39–117)
Alkaline Phosphatase: 80 U/L (ref 39–117)
BUN: 12 mg/dL (ref 6–23)
BUN: 17 mg/dL (ref 6–23)
CO2: 25 mEq/L (ref 19–32)
CO2: 28 mEq/L (ref 19–32)
Calcium: 8.7 mg/dL (ref 8.4–10.5)
Calcium: 8.7 mg/dL (ref 8.4–10.5)
Chloride: 100 mEq/L (ref 96–112)
Chloride: 103 mEq/L (ref 96–112)
Creatinine, Ser: 1.46 mg/dL (ref 0.4–1.5)
Creatinine, Ser: 1.48 mg/dL (ref 0.4–1.5)
GFR calc Af Amer: >60 mL/min (ref >60)
GFR calc Af Amer: >60 mL/min (ref >60)
GFR calc non Af Amer: 51 mL/min — ABNORMAL LOW (ref >60)
GFR calc non Af Amer: 52 mL/min — ABNORMAL LOW (ref >60)
Glucose, Bld: 110 mg/dL — ABNORMAL HIGH (ref 70–99)
Glucose, Bld: 135 mg/dL — ABNORMAL HIGH (ref 70–99)
Potassium: 3.6 mEq/L (ref 3.5–5.1)
Potassium: 3.7 mEq/L (ref 3.5–5.1)
Sodium: 133 mEq/L — ABNORMAL LOW (ref 135–145)
Sodium: 136 mEq/L (ref 135–145)
Total Bilirubin: 0.6 mg/dL (ref 0.3–1.2)
Total Bilirubin: 0.7 mg/dL (ref 0.3–1.2)
Total Protein: 6.9 g/dL (ref 6.0–8.3)
Total Protein: 7.3 g/dL (ref 6.0–8.3)

## 2010-09-27 LAB — URINALYSIS, ROUTINE W REFLEX MICROSCOPIC
Bilirubin Urine: NEGATIVE
Glucose, UA: NEGATIVE mg/dL
Ketones, ur: NEGATIVE mg/dL
Leukocytes, UA: NEGATIVE
Nitrite: NEGATIVE
Protein, ur: >300 mg/dL — AB
Specific Gravity, Urine: 1.02 (ref 1.005–1.030)
Urobilinogen, UA: 1 mg/dL (ref 0.0–1.0)
pH: 6 (ref 5.0–8.0)

## 2010-09-27 LAB — POCT I-STAT, CHEM 8
BUN: 19 mg/dL (ref 6–23)
Calcium, Ion: 1.15 mmol/L (ref 1.12–1.32)
Chloride: 105 mEq/L (ref 96–112)
Creatinine, Ser: 1.6 mg/dL — ABNORMAL HIGH (ref 0.4–1.5)
Glucose, Bld: 103 mg/dL — ABNORMAL HIGH (ref 70–99)
HCT: 35 % — ABNORMAL LOW (ref 39.0–52.0)
Hemoglobin: 11.9 g/dL — ABNORMAL LOW (ref 13.0–17.0)
Potassium: 4 mEq/L (ref 3.5–5.1)
Sodium: 138 mEq/L (ref 135–145)
TCO2: 27 mmol/L (ref 0–100)

## 2010-09-27 LAB — RAPID URINE DRUG SCREEN, HOSP PERFORMED
Amphetamines: NOT DETECTED
Amphetamines: NOT DETECTED
Barbiturates: NOT DETECTED
Barbiturates: NOT DETECTED
Benzodiazepines: NOT DETECTED
Benzodiazepines: NOT DETECTED
Cocaine: NOT DETECTED
Cocaine: NOT DETECTED
Opiates: NOT DETECTED
Opiates: NOT DETECTED
Tetrahydrocannabinol: POSITIVE — AB
Tetrahydrocannabinol: POSITIVE — AB

## 2010-09-27 LAB — CBC
HCT: 38.3 % — ABNORMAL LOW (ref 39.0–52.0)
HCT: 40.1 % (ref 39.0–52.0)
Hemoglobin: 13.2 g/dL (ref 13.0–17.0)
Hemoglobin: 13.9 g/dL (ref 13.0–17.0)
MCHC: 34.4 g/dL (ref 30.0–36.0)
MCHC: 34.6 g/dL (ref 30.0–36.0)
MCV: 87.1 fL (ref 78.0–100.0)
MCV: 88.6 fL (ref 78.0–100.0)
Platelets: 139 10*3/uL — ABNORMAL LOW (ref 150–400)
Platelets: 155 10*3/uL (ref 150–400)
RBC: 4.4 MIL/uL (ref 4.22–5.81)
RBC: 4.52 MIL/uL (ref 4.22–5.81)
RDW: 13.1 % (ref 11.5–15.5)
RDW: 13.3 % (ref 11.5–15.5)
WBC: 7.8 10*3/uL (ref 4.0–10.5)
WBC: 7.9 10*3/uL (ref 4.0–10.5)

## 2010-09-27 LAB — URINE MICROSCOPIC-ADD ON

## 2010-09-27 LAB — ETHANOL
Alcohol, Ethyl (B): 7 mg/dL (ref 0–10)
Alcohol, Ethyl (B): <5 mg/dL (ref 0–10)

## 2010-10-16 LAB — BASIC METABOLIC PANEL
BUN: 16 mg/dL (ref 6–23)
CO2: 23 mEq/L (ref 19–32)
Calcium: 9.4 mg/dL (ref 8.4–10.5)
Chloride: 103 mEq/L (ref 96–112)
Creatinine, Ser: 1.29 mg/dL (ref 0.4–1.5)
GFR calc Af Amer: >60 mL/min (ref >60)
GFR calc non Af Amer: 60 mL/min — ABNORMAL LOW (ref >60)
Glucose, Bld: 103 mg/dL — ABNORMAL HIGH (ref 70–99)
Potassium: 3.9 mEq/L (ref 3.5–5.1)
Sodium: 137 mEq/L (ref 135–145)

## 2010-10-16 LAB — POCT CARDIAC MARKERS
CKMB, poc: 3.8 ng/mL (ref 1.0–8.0)
CKMB, poc: 4 ng/mL (ref 1.0–8.0)
CKMB, poc: 4.5 ng/mL (ref 1.0–8.0)
Myoglobin, poc: 495 ng/mL (ref 12–200)
Myoglobin, poc: >500 ng/mL (ref 12–200)
Myoglobin, poc: >500 ng/mL (ref 12–200)
Troponin i, poc: <0.05 ng/mL (ref 0.00–0.09)
Troponin i, poc: <0.05 ng/mL (ref 0.00–0.09)
Troponin i, poc: <0.05 ng/mL (ref 0.00–0.09)

## 2010-10-16 LAB — CBC
HCT: 41.3 % (ref 39.0–52.0)
Hemoglobin: 14.1 g/dL (ref 13.0–17.0)
MCHC: 34.2 g/dL (ref 30.0–36.0)
MCV: 88.5 fL (ref 78.0–100.0)
Platelets: 143 10*3/uL — ABNORMAL LOW (ref 150–400)
RBC: 4.67 MIL/uL (ref 4.22–5.81)
RDW: 13.3 % (ref 11.5–15.5)
WBC: 9.2 10*3/uL (ref 4.0–10.5)

## 2010-10-16 LAB — TROPONIN I: Troponin I: 0.03 ng/mL (ref 0.00–0.06)

## 2010-10-18 LAB — CARDIAC PANEL(CRET KIN+CKTOT+MB+TROPI)
CK, MB: 1.6 ng/mL (ref 0.3–4.0)
CK, MB: 2.1 ng/mL (ref 0.3–4.0)
Relative Index: 1.6 (ref 0.0–2.5)
Relative Index: 1.7 (ref 0.0–2.5)
Total CK: 103 U/L (ref 7–232)
Total CK: 121 U/L (ref 7–232)
Troponin I: 0.03 ng/mL (ref 0.00–0.06)
Troponin I: 0.04 ng/mL (ref 0.00–0.06)

## 2010-10-18 LAB — POCT I-STAT, CHEM 8
BUN: 25 mg/dL — ABNORMAL HIGH (ref 6–23)
Calcium, Ion: 1.15 mmol/L (ref 1.12–1.32)
Chloride: 106 mEq/L (ref 96–112)
Creatinine, Ser: 1.2 mg/dL (ref 0.4–1.5)
Glucose, Bld: 131 mg/dL — ABNORMAL HIGH (ref 70–99)
HCT: 38 % — ABNORMAL LOW (ref 39.0–52.0)
Hemoglobin: 12.9 g/dL — ABNORMAL LOW (ref 13.0–17.0)
Potassium: 3.7 mEq/L (ref 3.5–5.1)
Sodium: 139 mEq/L (ref 135–145)
TCO2: 23 mmol/L (ref 0–100)

## 2010-10-18 LAB — CBC
HCT: 38.6 % — ABNORMAL LOW (ref 39.0–52.0)
Hemoglobin: 13.3 g/dL (ref 13.0–17.0)
MCHC: 34.4 g/dL (ref 30.0–36.0)
MCV: 89.2 fL (ref 78.0–100.0)
Platelets: 138 10*3/uL — ABNORMAL LOW (ref 150–400)
RBC: 4.32 MIL/uL (ref 4.22–5.81)
RDW: 14 % (ref 11.5–15.5)
WBC: 6.1 10*3/uL (ref 4.0–10.5)

## 2010-10-18 LAB — CK TOTAL AND CKMB (NOT AT ARMC)
CK, MB: 2.3 ng/mL (ref 0.3–4.0)
Relative Index: 1.6 (ref 0.0–2.5)
Total CK: 140 U/L (ref 7–232)

## 2010-10-18 LAB — POCT CARDIAC MARKERS
CKMB, poc: 1.2 ng/mL (ref 1.0–8.0)
Myoglobin, poc: 122 ng/mL (ref 12–200)
Troponin i, poc: <0.05 ng/mL (ref 0.00–0.09)

## 2010-10-18 LAB — BASIC METABOLIC PANEL
BUN: 15 mg/dL (ref 6–23)
CO2: 25 mEq/L (ref 19–32)
Calcium: 8.6 mg/dL (ref 8.4–10.5)
Chloride: 106 mEq/L (ref 96–112)
Creatinine, Ser: 1.14 mg/dL (ref 0.4–1.5)
GFR calc Af Amer: >60 mL/min (ref >60)
GFR calc non Af Amer: >60 mL/min (ref >60)
Glucose, Bld: 123 mg/dL — ABNORMAL HIGH (ref 70–99)
Potassium: 3.7 mEq/L (ref 3.5–5.1)
Sodium: 136 mEq/L (ref 135–145)

## 2010-10-18 LAB — TSH: TSH: 2.552 u[IU]/mL (ref 0.350–4.500)

## 2010-10-18 LAB — ETHANOL: Alcohol, Ethyl (B): <5 mg/dL (ref 0–10)

## 2010-10-18 LAB — TROPONIN I: Troponin I: 0.05 ng/mL (ref 0.00–0.06)

## 2010-11-24 NOTE — H&P (Signed)
NAMECHRIST, [name] NO.:  [id number]   MEDICAL RECORD NO.:  [id number]          PATIENT TYPE:  INP   LOCATION:  2902                         FACILITY:  [facility]   PHYSICIAN:  Jana Hakim, M.D. DATE OF BIRTH:  [birth year]   DATE OF ADMISSION:  01/24/2009  DATE OF DISCHARGE:                              HISTORY & PHYSICAL   DATE OF ADMISSION:  January 24, 2009.   PRIMARY CARE PHYSICIAN:  None.   CHIEF COMPLAINT:  Painful knot on right hand.   HISTORY OF PRESENT ILLNESS:  This is a 50 year old male who presents to  the emergency department because of complaints of a painful knot on the  right hand which he had noticed 1 week ago.  He believes he had been  bitten by a spider.  He states that he had been doing yard work.  He  denies any other trauma to this area.  The patient was evaluated in the  emergency department.  Though he presented with complaints of a painful  nodule on his hand, he was found to have severely elevated high blood  pressure.  The patient was sent for x-rays of his hand which were  negative for any acute fracture.  Focal soft tissue swelling had been  seen, but there were no acute findings.   The patient was further evaluated for his blood pressure issue.  On  initial presentation, his blood pressure was found to be 232/119.  The  patient reported that he had been diagnosed with hypertension 2 years  ago and he had been on medications.  He does not recall what medications  they were.  He does not report following up with a doctor recently.   PAST MEDICAL HISTORY:  1. Significant for hypertension.  2. Hepatitis.   PAST SURGICAL HISTORY:  None.   MEDICATIONS:  None.   ALLERGIES:  NO KNOWN DRUG ALLERGIES.   SOCIAL HISTORY:  The patient is a smoker.  He states that he smokes less  than 1 pack of cigarettes daily for 30 years.  He also reports that he  drinks heavily.  He drinks 1 pint or more of alcohol.  He cannot say how  much he  drinks daily.  He reports his last drink was 2 days ago.   FAMILY HISTORY:  Positive for coronary artery disease in his paternal  grandmother.  Positive for hypertension.  No history of diabetes or  cancer.   REVIEW OF SYSTEMS:  Pertinents are mentioned above.   PHYSICAL EXAMINATION:  GENERAL:  This is a 50 year old thin, well-  developed male in discomfort, but no acute distress.  VITAL SIGNS:  Temperature 98.3, blood pressure 232/119, heart rate 78,  respirations 16, O2 sat is 99%.  HEENT:  Normocephalic, atraumatic.  Pupils are equally round reactive to  light.  Extraocular movements are intact.  Funduscopic benign.  There is  no scleral icterus.  Nares are patent bilaterally.  Oropharynx is clear.  NECK:  Supple with full range of motion.  No thyromegaly, adenopathy,  jugular venous distention.  CARDIOVASCULAR:  Regular rate and rhythm.  No  murmurs, gallops or rubs.  LUNGS:  Clear to auscultation bilaterally.  ABDOMEN:  Positive bowel sounds, soft, nontender, nondistended.  EXTREMITIES:  Without cyanosis, clubbing or edema.  The right hand has a  nodular area along the mid MCP area.  This area is firm, nontender to  palpation and has a diameter of 0.5 cm.  NEUROLOGIC:  The patient is alert and oriented x3.  There are no focal  deficits.   LABORATORY STUDIES:  Sodium 139, potassium 3.7, chloride 106, CO2 23,  BUN 25, creatinine 1.2, glucose 131.  Point of care cardiac markers with  a myoglobin 122, CK-MB 1.2, troponin less than 0.05.   DIAGNOSTICS:  Right hand x-rays as mentioned above.   ASSESSMENT:  A 50 year old male being admitted with:  1. Hypertensive urgency  2. Alcohol abuse.  3. Ganglion cyst of the right hand.   PLAN:  The patient will be admitted to the ICU area.  The patient has  not responded to the initial medications which had been given in the  emergency department for his blood pressure.  The patient will be placed  on an IV Cardene drip.  This will be  titrated to help lower his blood  pressure.  The patient will also be placed on the IV Ativan protocol for  alcohol withdrawal.  An alcohol level will also be sent.  The patient  will be placed on DVT and GI prophylaxis at this time.      Jana Hakim, M.D.  Electronically Signed     HJ/MEDQ  D:  01/25/2009  T:  01/25/2009  Job:  [id number]

## 2010-11-24 NOTE — Discharge Summary (Signed)
NAMECOSBY, [name] NO.:  [id number]   MEDICAL RECORD NO.:  [id number]          PATIENT TYPE:  INP   LOCATION:  [id number]                         FACILITY:  [facility]   PHYSICIAN:  Noel Christmas, MD    DATE OF BIRTH:  09/28/1960   DATE OF ADMISSION:  01/24/2009  DATE OF DISCHARGE:  01/26/2009                               DISCHARGE SUMMARY   REASON FOR ADMISSION:  Swelling to right hand and hypertensive urgency.   FINAL DISCHARGE DIAGNOSES:  1. Hypertensive urgency (accelerated hypertension).  2. Alcohol abuse and withdrawal.  3. Ganglion cyst.  4. History of hepatitis A and B, diagnosed about 20 years ago.  The      patient not sure whether he was treated.  5. Tobacco abuse.   CONSULTS DURING THIS ADMISSION:  None.   PROCEDURE DONE DURING THIS ADMISSION:  None.   BRIEF HISTORY OF PRESENT ILLNESS AND HOSPITAL COURSE:  [name] is a  50 year old African American male with a past medical history  significant for hypertension and hepatitis.  He came in actually with a  swollen right hand which turned out to be a ganglion cyst, but in the  emergency room, he was noted to have accelerated hypertension and he was  admitted for further evaluation.  He is a known alcoholic, but in the  emergency room his alcohol level was less than 5 giving the suspicion  that he could also have been in alcohol withdrawal.  He has not been  known to be compliant with his outpatient blood pressure medications  either.  He was initially admitted to the Intensive Care Unit, but was  transferred to the regular floor the following day.  His blood pressure  was treated with clonidine and lisinopril with relatively good control.  The patient got anxious today and said he was willing to sign out  against medical advice.  Looking at the pros and cons of this patient  signing out against medical advice versus being sent home on his  medication, we believe at this time it would be safer for [place]  to sign out  this patient with his discharge medications and have him followup with  the primary care physician than let him go out without any medication  against medical advice.  Relatively stable for discharge today.   PHYSICAL EXAMINATION:  VITAL SIGNS:  His temperature is 98.7, pulse 57,  respirations 20, blood pressure 119/74, even though earlier this morning  it was 171/103.  HEENT:  Normocephalic, atraumatic.  Pupils equal, round, reactive to  light.  Extraocular muscles intact.  Nares patent.  NECK:  Supple.  No JVD.  No lymphadenopathy, no thyromegaly.  CARDIOVASCULAR:  First and second heart sounds only.  CHEST:  Clear to auscultation bilaterally.  CENTRAL NERVOUS SYSTEM:  Nonfocal.   The patient is to go home on lisinopril 40 mg p.o. daily and clonidine  increased to 0.2 mg p.o. b.i.d.  Also Ativan 1mg  po q8h for 1 day then  1mg  po q12h for 1 day and 1mg  po qdaily for 1 day.   He is to follow  up with his primary care physician with the help of  Social Service and this appointment should be in 1-2 weeks.  He has been  advised strongly against tobacco, alcohol, and other illicit drug abuse.  He voices understanding, but said that he cannot promise that he will  quit this medications.  Of note is the fact that at one point he had to  be on nicardipine drip for his high blood pressure.  We have asked this  patient to return to the emergency room if he has any headache,  shortness of breath, abdominal pain, nausea, or vomiting.  He is also to  come if he has any chest pain or other signs of elevated blood pressure.   The time used for discharge planning is greater than 30 minutes.      Noel Christmas, MD  Electronically Signed     GU/MEDQ  D:  01/26/2009  T:  01/27/2009  Job:  [id number]

## 2010-12-22 ENCOUNTER — Emergency Department (HOSPITAL_COMMUNITY): Payer: Self-pay

## 2010-12-22 ENCOUNTER — Inpatient Hospital Stay (HOSPITAL_COMMUNITY)
Admission: EM | Admit: 2010-12-22 | Discharge: 2010-12-24 | DRG: 078 | Disposition: A | Discharge status: Home / Self Care | Payer: Self-pay | Attending: Internal Medicine | Admitting: Internal Medicine

## 2010-12-22 DIAGNOSIS — B181 Chronic viral hepatitis B without delta-agent: Secondary | ICD-10-CM | POA: Diagnosis present

## 2010-12-22 DIAGNOSIS — I129 Hypertensive chronic kidney disease with stage 1 through stage 4 chronic kidney disease, or unspecified chronic kidney disease: Secondary | ICD-10-CM | POA: Diagnosis present

## 2010-12-22 DIAGNOSIS — N179 Acute kidney failure, unspecified: Secondary | ICD-10-CM | POA: Diagnosis present

## 2010-12-22 DIAGNOSIS — F1410 Cocaine abuse, uncomplicated: Secondary | ICD-10-CM | POA: Diagnosis present

## 2010-12-22 DIAGNOSIS — F17200 Nicotine dependence, unspecified, uncomplicated: Secondary | ICD-10-CM | POA: Diagnosis present

## 2010-12-22 DIAGNOSIS — N184 Chronic kidney disease, stage 4 (severe): Secondary | ICD-10-CM | POA: Diagnosis present

## 2010-12-22 DIAGNOSIS — B182 Chronic viral hepatitis C: Secondary | ICD-10-CM | POA: Diagnosis present

## 2010-12-22 DIAGNOSIS — I674 Hypertensive encephalopathy: Principal | ICD-10-CM | POA: Diagnosis present

## 2010-12-22 DIAGNOSIS — D649 Anemia, unspecified: Secondary | ICD-10-CM | POA: Diagnosis present

## 2010-12-22 LAB — CBC
HCT: 28.3 % — ABNORMAL LOW (ref 39.0–52.0)
Hemoglobin: 10.2 g/dL — ABNORMAL LOW (ref 13.0–17.0)
MCH: 27.6 pg (ref 26.0–34.0)
MCHC: 36 g/dL (ref 30.0–36.0)
MCV: 76.5 fL — ABNORMAL LOW (ref 78.0–100.0)
Platelets: 108 10*3/uL — ABNORMAL LOW (ref 150–400)
RBC: 3.7 MIL/uL — ABNORMAL LOW (ref 4.22–5.81)
RDW: 14.9 % (ref 11.5–15.5)
WBC: 9.2 10*3/uL (ref 4.0–10.5)

## 2010-12-22 LAB — URINALYSIS, ROUTINE W REFLEX MICROSCOPIC
Bilirubin Urine: NEGATIVE
Glucose, UA: NEGATIVE mg/dL
Ketones, ur: NEGATIVE mg/dL
Leukocytes, UA: NEGATIVE
Nitrite: NEGATIVE
Protein, ur: 100 mg/dL — AB
Specific Gravity, Urine: 1.009 (ref 1.005–1.030)
Urobilinogen, UA: 0.2 mg/dL (ref 0.0–1.0)
pH: 6.5 (ref 5.0–8.0)

## 2010-12-22 LAB — DIFFERENTIAL
Basophils Absolute: 0 10*3/uL (ref 0.0–0.1)
Basophils Relative: 0 % (ref 0–1)
Eosinophils Absolute: 0.2 10*3/uL (ref 0.0–0.7)
Eosinophils Relative: 2 % (ref 0–5)
Lymphocytes Relative: 46 % (ref 12–46)
Lymphs Abs: 4.2 10*3/uL — ABNORMAL HIGH (ref 0.7–4.0)
Monocytes Absolute: 0.7 10*3/uL (ref 0.1–1.0)
Monocytes Relative: 7 % (ref 3–12)
Neutro Abs: 4.1 10*3/uL (ref 1.7–7.7)
Neutrophils Relative %: 44 % (ref 43–77)

## 2010-12-22 LAB — RAPID URINE DRUG SCREEN, HOSP PERFORMED
Amphetamines: NOT DETECTED
Barbiturates: NOT DETECTED
Benzodiazepines: NOT DETECTED
Cocaine: POSITIVE — AB
Opiates: NOT DETECTED
Tetrahydrocannabinol: POSITIVE — AB

## 2010-12-22 LAB — COMPREHENSIVE METABOLIC PANEL
ALT: 121 U/L — ABNORMAL HIGH (ref 0–53)
AST: 121 U/L — ABNORMAL HIGH (ref 0–37)
Albumin: 2.9 g/dL — ABNORMAL LOW (ref 3.5–5.2)
Alkaline Phosphatase: 110 U/L (ref 39–117)
BUN: 72 mg/dL — ABNORMAL HIGH (ref 6–23)
CO2: 24 mEq/L (ref 19–32)
Calcium: 8.7 mg/dL (ref 8.4–10.5)
Chloride: 104 mEq/L (ref 96–112)
Creatinine, Ser: 5.87 mg/dL — ABNORMAL HIGH (ref 0.4–1.5)
GFR calc Af Amer: 13 mL/min — ABNORMAL LOW (ref >60)
GFR calc non Af Amer: 10 mL/min — ABNORMAL LOW (ref >60)
Glucose, Bld: 92 mg/dL (ref 70–99)
Potassium: 4.2 mEq/L (ref 3.5–5.1)
Sodium: 138 mEq/L (ref 135–145)
Total Bilirubin: 0.4 mg/dL (ref 0.3–1.2)
Total Protein: 7.6 g/dL (ref 6.0–8.3)

## 2010-12-22 LAB — CK: Total CK: 505 U/L — ABNORMAL HIGH (ref 7–232)

## 2010-12-22 LAB — PROTIME-INR
INR: 0.95 (ref 0.00–1.49)
Prothrombin Time: 12.9 seconds (ref 11.6–15.2)

## 2010-12-22 LAB — URINE MICROSCOPIC-ADD ON

## 2010-12-22 LAB — APTT: aPTT: 30 seconds (ref 24–37)

## 2010-12-23 ENCOUNTER — Inpatient Hospital Stay (HOSPITAL_COMMUNITY): Payer: Self-pay

## 2010-12-23 LAB — PROTEIN / CREATININE RATIO, URINE
Creatinine, Urine: 33.11 mg/dL
Protein Creatinine Ratio: 5.24 — ABNORMAL HIGH (ref 0.00–0.15)
Total Protein, Urine: 173.4 mg/dL

## 2010-12-23 LAB — CARDIAC PANEL(CRET KIN+CKTOT+MB+TROPI)
CK, MB: 5.6 ng/mL — ABNORMAL HIGH (ref 0.3–4.0)
CK, MB: 5 ng/mL — ABNORMAL HIGH (ref 0.3–4.0)
CK, MB: 4.6 ng/mL — ABNORMAL HIGH (ref 0.3–4.0)
Relative Index: 1.3 (ref 0.0–2.5)
Relative Index: 1.6 (ref 0.0–2.5)
Relative Index: 1.7 (ref 0.0–2.5)
Total CK: 420 U/L — ABNORMAL HIGH (ref 7–232)
Total CK: 320 U/L — ABNORMAL HIGH (ref 7–232)
Total CK: 274 U/L — ABNORMAL HIGH (ref 7–232)
Troponin I: <0.3 ng/mL (ref <0.30)
Troponin I: <0.3 ng/mL (ref <0.30)
Troponin I: <0.3 ng/mL (ref <0.30)

## 2010-12-23 LAB — RENAL FUNCTION PANEL
Albumin: 2.7 g/dL — ABNORMAL LOW (ref 3.5–5.2)
BUN: 65 mg/dL — ABNORMAL HIGH (ref 6–23)
CO2: 20 mEq/L (ref 19–32)
Calcium: 8.5 mg/dL (ref 8.4–10.5)
Chloride: 107 mEq/L (ref 96–112)
Creatinine, Ser: 5.23 mg/dL — ABNORMAL HIGH (ref 0.4–1.5)
GFR calc Af Amer: 14 mL/min — ABNORMAL LOW (ref >60)
GFR calc non Af Amer: 12 mL/min — ABNORMAL LOW (ref >60)
Glucose, Bld: 104 mg/dL — ABNORMAL HIGH (ref 70–99)
Phosphorus: 5.2 mg/dL — ABNORMAL HIGH (ref 2.3–4.6)
Potassium: 3.7 mEq/L (ref 3.5–5.1)
Sodium: 140 mEq/L (ref 135–145)

## 2010-12-23 LAB — CREATININE, URINE, RANDOM: Creatinine, Urine: 34.47 mg/dL

## 2010-12-23 LAB — CBC
HCT: 26.2 % — ABNORMAL LOW (ref 39.0–52.0)
Hemoglobin: 9.5 g/dL — ABNORMAL LOW (ref 13.0–17.0)
MCH: 27.7 pg (ref 26.0–34.0)
MCHC: 36.3 g/dL — ABNORMAL HIGH (ref 30.0–36.0)
MCV: 76.4 fL — ABNORMAL LOW (ref 78.0–100.0)
Platelets: 106 10*3/uL — ABNORMAL LOW (ref 150–400)
RBC: 3.43 MIL/uL — ABNORMAL LOW (ref 4.22–5.81)
RDW: 14.7 % (ref 11.5–15.5)
WBC: 7.4 10*3/uL (ref 4.0–10.5)

## 2010-12-23 LAB — FOLATE: Folate: 12.2 ng/mL

## 2010-12-23 LAB — HIV ANTIBODY (ROUTINE TESTING W REFLEX): HIV: NONREACTIVE

## 2010-12-23 LAB — IRON AND TIBC
Iron: 126 ug/dL (ref 42–135)
Saturation Ratios: 44 % (ref 20–55)
TIBC: 289 ug/dL (ref 215–435)
UIBC: 163 ug/dL

## 2010-12-23 LAB — VITAMIN B12: Vitamin B-12: 746 pg/mL (ref 211–911)

## 2010-12-23 LAB — SODIUM, URINE, RANDOM: Sodium, Ur: 100 mEq/L

## 2010-12-23 LAB — FERRITIN: Ferritin: 782 ng/mL — ABNORMAL HIGH (ref 22–322)

## 2010-12-24 LAB — RENAL FUNCTION PANEL
Albumin: 2.6 g/dL — ABNORMAL LOW (ref 3.5–5.2)
BUN: 67 mg/dL — ABNORMAL HIGH (ref 6–23)
CO2: 21 mEq/L (ref 19–32)
Calcium: 8.6 mg/dL (ref 8.4–10.5)
Chloride: 102 mEq/L (ref 96–112)
Creatinine, Ser: 5.9 mg/dL — ABNORMAL HIGH (ref 0.4–1.5)
GFR calc Af Amer: 12 mL/min — ABNORMAL LOW (ref >60)
GFR calc non Af Amer: 10 mL/min — ABNORMAL LOW (ref >60)
Glucose, Bld: 102 mg/dL — ABNORMAL HIGH (ref 70–99)
Phosphorus: 6.3 mg/dL — ABNORMAL HIGH (ref 2.3–4.6)
Potassium: 3.9 mEq/L (ref 3.5–5.1)
Sodium: 134 mEq/L — ABNORMAL LOW (ref 135–145)

## 2010-12-24 LAB — HEPATITIS PANEL, ACUTE
HCV Ab: REACTIVE — AB
Hep A IgM: NEGATIVE
Hep B C IgM: NEGATIVE
Hepatitis B Surface Ag: POSITIVE — AB

## 2010-12-25 NOTE — H&P (Signed)
NAMEBARAKA, Diaz NO.:  [id number]  MEDICAL RECORD NO.:  [id number]  LOCATION:  [facility]                         FACILITY:  [facility]  PHYSICIAN:  Alphia Moh, MD   DATE OF BIRTH:  [birth year]  DATE OF ADMISSION:  12/22/2010 DATE OF DISCHARGE:                             HISTORY & PHYSICAL   PRIMARY CARE PHYSICIAN:  Currently none, but followed with [facility] previously.  CHIEF COMPLAINT:  Blurry vision.  HISTORY OF PRESENT ILLNESS:  [name] is a 50 year old gentleman with past medical history significant for hypertension, polysubstance abuse including tobacco, alcohol, marijuana, and cocaine, history of hepatitis A and B per patient while using IV drugs approximately 20 years ago as well as some proteinuria documented in the past who presents with chief complaint of blurry vision for approximately 1-2 weeks.  He states that this has been getting gradually worse.  He denies any double vision or loss of vision, any pain in his eyes.  On review of systems, he reports that he has had some itching for approximately the last month, which he thought was associated to a poison ivy or poison oak exposure.  Upon evaluation in the emergency department, he was noted to have renal failure with a BUN of 72 and a creatinine of 5.87 along with some other abnormalities and medicine was called to admit.  PAST MEDICAL HISTORY: 1. Hypertension. 2. Polysubstance abuse.     a.     Tobacco.     b.     Alcohol.     c.     Cocaine.     d.     Prior IV drug abuse. 3. History of hepatitis A and B. 4. Proteinuria.  MEDICATIONS:  None.  ALLERGIES:  TRAMADOL.  SOCIAL HISTORY:  The patient lives in [place] with his sister.  He reports tobacco and alcohol use, but refuses to give any information as to quantity stating that he just does it.  States that he occasionally smokes marijuana, but again does not wish to quantify.  He denies any other drug use including  cocaine or IV drug use.  FAMILY HISTORY:  Positive for coronary artery disease and hypertension.  REVIEW OF SYSTEMS:  As per HPI.  All others reviewed and negative.  PHYSICAL EXAMINATION:  INITIAL VITAL SIGNS:  Temperature 98.6, heart rate of 85, blood pressure 152/96, respirations 14, oxygen saturation 99% on room air.  Followup blood pressure is in the systolic [id number] range with diastolic of [id number] range. GENERAL:  This is a middle-aged man lying in bed in no acute distress who appears slightly anxious. HEENT:  Head is normocephalic, atraumatic.  Pupils equally round and reactive to light.  Extraocular movements are intact.  Sclerae are anicteric.  Mucous membranes are dry.  Funduscopic exam, difficult to visualize, but no obvious papilledema noted. NECK:  Supple with no JVD, no thyromegaly, no lymphadenopathy. LUNGS:  There is good air movement bilaterally that is clear to auscultation. HEART:  There is normal S1 and S2 with a regular rate and rhythm.  There are no murmurs, gallops, or rubs. ABDOMEN:  Positive bowel sounds, soft, nontender, nondistended with no guarding or rebound. EXTREMITIES:  There is  no cyanosis, clubbing, or edema. NEUROLOGIC:  The patient is awake, alert, and oriented x3.  Cranial nerves are intact.  Motor is intact.  Sensation is intact.  LABORATORY DATA:  WBC 9.2, hemoglobin 10.2, platelets 108.  Urinalysis with moderate blood, 100 protein, but no cells seen on microscopy.  A urine drug screen positive for cocaine and THC.  Sodium 138, potassium 4.2, chloride 104, bicarb 24, glucose 92, BUN 72, creatinine 5.9, bilirubin 0.4, alkaline phosphatase 110, AST of 121, ALT 121, total protein 7.6, albumin 2.9, calcium 8.7.  PT 12.9, INR was 0.95, PTT of 30, CK 505.  IMAGING:  CT head without contrast.  Impression, no acute intracranial normality.  Mild chronic ischemic periventricular white matter disease, possibly associated with alcoholism.  ASSESSMENT  AND PLAN: 1. Blurry vision.  The patient reports that this is over a 2-week     period and has decreased visual acuity on exam.  Again given the     patient's hypertension, it is possible that this is a hypertensive     urgency in the setting of acute cocaine intoxication.  See problems     below. 2. Hypertensive urgency.  The patient's systolic blood pressure is     mentioned above in the 250-260 range with diastolics of [id number].     The patient has a history of hypertension, but currently is cocaine     positive despite not admitting to its use.  Given this finding, we     will go ahead and treat initially with IV Ativan to decrease the     sympathetic drive.  Given cocaine's short half life, we will     monitor for symptoms of agitation and treat with benzodiazepines.     If the patient is calm, but remained significantly hypertensive, we     will then go and treat as per hypertension avoiding the use of beta-     blockers in light of acute cocaine intoxication.  Of note, aside     from the patient's blurry vision that has been going on for 1-2     weeks, he does not have any signs of acute end-organ damage aside     from the renal failure noted, which we will discuss below.  We will     check an electrocardiogram along with cycling of the cardiac     enzymes given the marked hypertension and cocaine positivity. 3. Acute renal failure.  The patient's last creatinine was a year ago     and was within normal limits, therefore, the renal failure is new     from last year.  The patient does have prior documentation of     proteinuria on prior urinalysis.  It is unclear if this is the     result from progression of uncontrolled hypertension versus     hypertensive urgency given the patient's blood pressure as noted     above.  At this point, we will treat his hypertension as mentioned     above.  We will go ahead and check urine studies including a urine     sodium and creatinine to  calculate a FENA though this does not     appear to be prerenal.  Other possibilities include vasospasms from     cocaine though I think this is less likely.  We will go ahead and     check a urine protein to creatinine ratio to assess for level of     proteinuria.  I will also go ahead and check an abdominal     ultrasound, of note he has had one approximately 1 year ago that     showed increased echogenicity.  We would recommend getting a     Nephrology consultation in the morning to recommend further     studies.  Assume that serologies will be requested by Nephrology,     but we will defer to them for further workup.  We will go ahead and     check an acute hepatitis panel per problem below. 4. Abnormal LFTs.  The patient has had AST and ALT in the range of 100-     300 consistently in the past.  He does report a history of     hepatitis A and B about 20 years ago while using intravenous drugs.     I have got no documentation as to whether this is chronic     infection, and therefore we will go ahead and check an acute     hepatitis panel.  I will also further evaluate with abdominal     ultrasound.  Again, the patient had one approximately 1 year ago     that showed fatty infiltration.  Also, alcohol use likely     contributing along with fatty liver disease. 5. Polysubstance abuse.  The patient admits to some, but not all     substances currently noted.  We will get social work consult for     substance counseling.     Alphia Moh, MD     MA/MEDQ  D:  12/23/2010  T:  12/23/2010  Job:  [id number]  Electronically Signed by Alphia Moh MD on 12/25/2010 11:16:45 AM

## 2010-12-27 NOTE — Discharge Summary (Signed)
[name], [name] NO.:  [id number]  MEDICAL RECORD NO.:  [id number]  LOCATION:  6709                         FACILITY:  [facility]  PHYSICIAN:  Edythe Lynn, M.D.       DATE OF BIRTH:  11/25/1960  DATE OF ADMISSION:  12/22/2010 DATE OF DISCHARGE:  12/24/2010                              DISCHARGE SUMMARY   PRIMARY CARE PHYSICIAN:  [facility].  DISCHARGE DIAGNOSES: 1. Chronic kidney disease stage IV. 2. Hypertension, malignant. 3. Hypertensive encephalopathy resolved. 4 . Cocaine abuse. 1. Tobacco abuse. 2. History intravenous drug abuse. 3. Chronic hepatitis B and hepatitis C.  DISCHARGE MEDICATIONS: 1. Labetalol 200 mg by mouth 3 times a day. 2. Calcium carbonate 1 tablet by mouth 3 times a day. 3. Lasix 40 mg daily. 4. Hydralazine 25 mg 3 times a day. 5. Multivitamin 1 capsule daily. 6. Nicotine 21 mg daily. 7. Nifedipine extended release 30 mg daily.  CONDITION ON DISCHARGE:  [name] was discharged in good condition.  Temperature 97.6, pulse 79, respirations 18, blood pressure 150/84, saturation 98% on room air.  The patient's discharge creatinine was 5.9. He was set up to follow up with [facility] for repeat renal panel on December 28, 2010.  The results will be faxed to Dr. Corliss Parish from [facility].  The patient will follow up in the office of the [facility] on January 07, 2011 with Dr. Corliss Parish.  PROCEDURE THIS ADMISSION: 1. The patient underwent a head CT without contrast which shows no     acute intracranial abnormality, chronic ischemic changes. 2. December 23, 2010 renal ultrasound showing bilateral echogenic kidneys     consistent with chronic medical renal disease.  HISTORY AND PHYSICAL:  Refer to dictated H&P done by Dr. Philbert Riser.  HOSPITAL COURSE:  [name] is a 50 year old gentleman with history of hypertension, chronic hepatitis B and C, cocaine abuse,  intravenous drug use in the past was admitted from the emergency room with complaints of dizziness and extremely elevated blood pressure levels.  He was also having some blurry vision and some headaches.  He was diagnosed with hypertensive encephalopathy and it was also noted that he had significant elevation in his creatinine.  In assessing his renal disease myself and the consulting nephrologist, we felt that he probably has chronic kidney disease from severe hypertension, unlikely from the hepatitis B and C.  His renal sediment analysis during the urinalysis revealed no red blood cells, minimum amount of protein and no significant amount of white blood cells.  This is a fairly bland sediment suggesting that majority of his chronic kidney disease is related to his hypertension rather than hepatitis.  The patient was titrated on antihypertensives with labetalol, nifedipine, and Lasix as well as hydralazine and his symptoms of headache, blurry vision, and dizziness improved.  His creatinine remained stable.  He still has adequate urinary output.  The patient had thorough education about diet, proper followup, the dangers associated with ongoing usage of cocaine and noncompliance with his medications.  He seemed to voice understanding of the plan and the willingness to comply with followup in the outpatient setting.  Edythe Lynn, M.D.     SL/MEDQ  D:  12/25/2010  T:  12/25/2010  Job:  [id number]  cc:   Leia Alf, M.D.  Electronically Signed by Edythe Lynn M.D. on 12/27/2010 05:34:08 PM

## 2011-01-25 ENCOUNTER — Ambulatory Visit (INDEPENDENT_AMBULATORY_CARE_PROVIDER_SITE_OTHER): Payer: Self-pay | Admitting: Surgery

## 2011-01-25 ENCOUNTER — Encounter (INDEPENDENT_AMBULATORY_CARE_PROVIDER_SITE_OTHER): Payer: Self-pay

## 2011-01-25 DIAGNOSIS — N185 Chronic kidney disease, stage 5: Secondary | ICD-10-CM

## 2011-01-25 DIAGNOSIS — N186 End stage renal disease: Secondary | ICD-10-CM

## 2011-01-25 DIAGNOSIS — Z01810 Encounter for preprocedural cardiovascular examination: Secondary | ICD-10-CM

## 2011-01-26 NOTE — Assessment & Plan Note (Signed)
OFFICE VISIT  TAVARES, KIYABU DOB:  26-Nov-1960                                       01/25/2011 [id number]  REASON FOR VISIT:  Dialysis access.  HISTORY:  This is a 50 year old gentleman with chronic kidney disease stage V secondary to hypertension who is being referred for dialysis access.  He is right-handed.  The patient also has a history of polysubstance abuse.  He has subsequently developed hepatitis B and C. He is having issues with blurry vision and itching.  He is not yet on dialysis.  REVIEW OF SYSTEMS:  Negative except for what has been listed in the HPI. This is documented in the encounter form.  PAST MEDICAL HISTORY:  Chronic renal disease, stage five hypertension, polysubstance abuse including IV drug use, chronic hepatitis B, hepatitis C, metabolic bone disease, anemia of chronic disease, history of poor compliance.  SOCIAL HISTORY:  Single with 3 children.  Smokes one pack a day.  Does not drink.  FAMILY HISTORY:  Positive for hypertension and COPD.  ALLERGIES:  Tramadol which causes a rash.  PHYSICAL EXAMINATION:  Heart rate 55, blood pressure 223/99, temperature is 98.2.  General:  Well-appearing, no distress.  HEENT:  Within normal limits.  Lungs:  Clear bilaterally.  Cardiovascular:  Regular rate and rhythm.  Palpable left radial and left brachial pulse.  Abdomen:  Soft, nontender.  Musculoskeletal:  No major deformities.  He has bilateral edema.  Neuro:  No focal deficits.  Skin:  Without rash.  DIAGNOSTICS:  Vein mapping was performed today.  The patient has inadequate bilateral cephalic and basilic vein.  ASSESSMENT/PLAN:  Chronic kidney disease needing access.  PLAN:  The patient is not a candidate for fistula due to the size of his veins.  I have scheduled him for a left forearm Gore-Tex graft to be performed this Thursday, July 19.  The risks and benefits were discussed with the patient including the risk of  infection and the risk of future interventions.  All of his questions were answered today.    Eldridge Abrahams, MD Electronically Signed  VWB/MEDQ  D:  01/25/2011  T:  01/26/2011  Job:  [id number]  cc:   Posey Pronto, M.D.

## 2011-01-28 ENCOUNTER — Ambulatory Visit (HOSPITAL_COMMUNITY)
Admission: RE | Admit: 2011-01-28 | Discharge: 2011-01-28 | Disposition: A | Discharge status: Home / Self Care | Payer: Self-pay | Source: Ambulatory Visit | Attending: Surgery | Admitting: Surgery

## 2011-01-28 DIAGNOSIS — Z01812 Encounter for preprocedural laboratory examination: Secondary | ICD-10-CM | POA: Insufficient documentation

## 2011-01-28 DIAGNOSIS — I120 Hypertensive chronic kidney disease with stage 5 chronic kidney disease or end stage renal disease: Secondary | ICD-10-CM

## 2011-01-28 DIAGNOSIS — J4489 Other specified chronic obstructive pulmonary disease: Secondary | ICD-10-CM | POA: Insufficient documentation

## 2011-01-28 DIAGNOSIS — I129 Hypertensive chronic kidney disease with stage 1 through stage 4 chronic kidney disease, or unspecified chronic kidney disease: Secondary | ICD-10-CM | POA: Insufficient documentation

## 2011-01-28 DIAGNOSIS — B1920 Unspecified viral hepatitis C without hepatic coma: Secondary | ICD-10-CM | POA: Insufficient documentation

## 2011-01-28 DIAGNOSIS — N186 End stage renal disease: Secondary | ICD-10-CM

## 2011-01-28 DIAGNOSIS — F17200 Nicotine dependence, unspecified, uncomplicated: Secondary | ICD-10-CM | POA: Insufficient documentation

## 2011-01-28 DIAGNOSIS — N189 Chronic kidney disease, unspecified: Secondary | ICD-10-CM | POA: Insufficient documentation

## 2011-01-28 DIAGNOSIS — J449 Chronic obstructive pulmonary disease, unspecified: Secondary | ICD-10-CM | POA: Insufficient documentation

## 2011-01-28 DIAGNOSIS — B1910 Unspecified viral hepatitis B without hepatic coma: Secondary | ICD-10-CM | POA: Insufficient documentation

## 2011-01-28 LAB — POCT I-STAT 4, (NA,K, GLUC, HGB,HCT)
Glucose, Bld: 106 mg/dL — ABNORMAL HIGH (ref 70–99)
HCT: 30 % — ABNORMAL LOW (ref 39.0–52.0)
Hemoglobin: 10.2 g/dL — ABNORMAL LOW (ref 13.0–17.0)
Potassium: 3.9 mEq/L (ref 3.5–5.1)
Sodium: 142 mEq/L (ref 135–145)

## 2011-01-28 LAB — SURGICAL PCR SCREEN
MRSA, PCR: NEGATIVE
Staphylococcus aureus: NEGATIVE

## 2011-02-01 NOTE — Procedures (Unsigned)
CEPHALIC VEIN MAPPING  INDICATION:  Preoperative vein mapping for AVF placement.  HISTORY: Chronic kidney disease stage V.  EXAM:  The right cephalic vein is compressible with diameter measurements ranging from 0.13 to 0.23 cm.  The right basilic vein is compressible with diameter measurements ranging from 0.19 to 0.25 cm.  The left cephalic vein is compressible with diameter measurements ranging from 0.10 to 0.25 cm.  The left basilic vein is compressible where imaged in the upper arm with diameter measurements ranging from 0.18 cm to 0.20 cm.  See attached worksheet for all measurements.  IMPRESSION:  Patent right and left cephalic and basilic veins with diameter measurements as described above.  ___________________________________________ V. Leia Alf, MD  EM/MEDQ  D:  01/25/2011  T:  01/25/2011  Job:  [id number]

## 2011-02-12 ENCOUNTER — Encounter (HOSPITAL_COMMUNITY): Payer: Self-pay | Attending: Nephrology

## 2011-02-12 ENCOUNTER — Other Ambulatory Visit: Payer: Self-pay | Admitting: Nephrology

## 2011-02-12 DIAGNOSIS — D638 Anemia in other chronic diseases classified elsewhere: Secondary | ICD-10-CM | POA: Insufficient documentation

## 2011-02-12 DIAGNOSIS — I120 Hypertensive chronic kidney disease with stage 5 chronic kidney disease or end stage renal disease: Secondary | ICD-10-CM | POA: Insufficient documentation

## 2011-02-12 DIAGNOSIS — N185 Chronic kidney disease, stage 5: Secondary | ICD-10-CM | POA: Insufficient documentation

## 2011-02-12 LAB — POCT HEMOGLOBIN-HEMACUE: Hemoglobin: 9.9 g/dL — ABNORMAL LOW (ref 13.0–17.0)

## 2011-02-18 ENCOUNTER — Other Ambulatory Visit: Payer: Self-pay | Admitting: Nephrology

## 2011-02-18 ENCOUNTER — Encounter (HOSPITAL_COMMUNITY): Payer: Self-pay

## 2011-02-18 LAB — POCT HEMOGLOBIN-HEMACUE: Hemoglobin: 9.6 g/dL — ABNORMAL LOW (ref 13.0–17.0)

## 2011-02-24 ENCOUNTER — Encounter (HOSPITAL_COMMUNITY): Payer: Self-pay

## 2011-02-24 ENCOUNTER — Other Ambulatory Visit: Payer: Self-pay | Admitting: Nephrology

## 2011-02-25 ENCOUNTER — Encounter: Payer: Self-pay | Admitting: Surgery

## 2011-02-25 LAB — POCT HEMOGLOBIN-HEMACUE: Hemoglobin: 9.7 g/dL — ABNORMAL LOW (ref 13.0–17.0)

## 2011-02-26 ENCOUNTER — Encounter (HOSPITAL_COMMUNITY): Payer: Self-pay

## 2011-03-01 ENCOUNTER — Ambulatory Visit: Payer: Self-pay | Admitting: Surgery

## 2011-03-04 ENCOUNTER — Other Ambulatory Visit: Payer: Self-pay | Admitting: Nephrology

## 2011-03-04 ENCOUNTER — Encounter (HOSPITAL_COMMUNITY): Payer: Self-pay

## 2011-03-04 LAB — RENAL FUNCTION PANEL
Albumin: 3 g/dL — ABNORMAL LOW (ref 3.5–5.2)
BUN: 66 mg/dL — ABNORMAL HIGH (ref 6–23)
CO2: 22 mEq/L (ref 19–32)
Calcium: 9.2 mg/dL (ref 8.4–10.5)
Chloride: 103 mEq/L (ref 96–112)
Creatinine, Ser: 5.48 mg/dL — ABNORMAL HIGH (ref 0.50–1.35)
GFR calc Af Amer: 14 mL/min — ABNORMAL LOW (ref >60)
GFR calc non Af Amer: 11 mL/min — ABNORMAL LOW (ref >60)
Glucose, Bld: 133 mg/dL — ABNORMAL HIGH (ref 70–99)
Phosphorus: 3.8 mg/dL (ref 2.3–4.6)
Potassium: 3.4 mEq/L — ABNORMAL LOW (ref 3.5–5.1)
Sodium: 138 mEq/L (ref 135–145)

## 2011-03-04 LAB — POCT HEMOGLOBIN-HEMACUE: Hemoglobin: 9.8 g/dL — ABNORMAL LOW (ref 13.0–17.0)

## 2011-03-04 LAB — IRON AND TIBC
Iron: 134 ug/dL (ref 42–135)
Saturation Ratios: 39 % (ref 20–55)
TIBC: 344 ug/dL (ref 215–435)
UIBC: 210 ug/dL

## 2011-03-04 LAB — FERRITIN: Ferritin: 325 ng/mL — ABNORMAL HIGH (ref 22–322)

## 2011-03-11 ENCOUNTER — Other Ambulatory Visit: Payer: Self-pay | Admitting: Nephrology

## 2011-03-11 ENCOUNTER — Encounter (HOSPITAL_COMMUNITY): Payer: Self-pay

## 2011-03-12 NOTE — Op Note (Signed)
[name], [name]              ACCOUNT NO.:  [id number]  MEDICAL RECORD NO.:  [id number]  LOCATION:  SDSC                         FACILITY:  [facility]  PHYSICIAN:  Theotis Burrow IV, MDDATE OF BIRTH:  [birth year]  DATE OF PROCEDURE:  01/28/2011 DATE OF DISCHARGE:  01/28/2011                              OPERATIVE REPORT   PREOPERATIVE DIAGNOSIS:  Chronic renal disease.  POSTOPERATIVE DIAGNOSIS:  Chronic renal disease.  PROCEDURE PERFORMED:  Left upper arm AV fistula.  SURGEON: 1. Leia Alf, MD  ASSISTANT:  Wray Kearns, PA-C  ANESTHESIA:  MAC.  FINDINGS:  Excellent 4-mm upper arm cephalic vein, 4-mm artery nondiseased.  INDICATIONS:  This is a 50 year old gentleman with a progressive renal failure not yet on dialysis.  Preoperative vein mapping revealed that he had a very small cephalic and basilic vein, less than 2 mm.  He was scheduled for a left forearm graft.  PROCEDURE:  The patient was identified in the holding and taken to room 6, placed supine on the table.  LMA anesthesia was administered.  The patient was prepped and draped in the usual fashion.  Time-out was called.  The patient's cephalic and basilic venous system were very dilated and distended, so I used ultrasound to trace the amount and the cephalic vein was excellent and had excellent caliber, was easily compressible.  Therefore, I decided to proceed with a fistula placement. The patient does have a chainsaw scar in his forearm.  Therefore, I elected to go to the upper arm for his fistula.  A transverse incision was made in the antecubital crease.  The cephalic vein was identified within the wound.  It was easily compressible, nondiseased and about 4 mm.  It was circumferentially dissected free, encircled with a vessel loop, was mobilized proximally and distally throughout the width of the incision.  It was marked with an ink pen to ensure proper orientation.  Next, the brachial artery was  dissected free and this was a 4-mm artery that was disease free.  Vessel loops were placed proximally and distally.  The patient was given 3000 units of heparin.  Right angle was placed in the distal cephalic vein and it was transected.  At this point, it was tied off with a 2-0 silk tie.  The vein was flushed with heparin saline and had a good thrill upon flushing with saline.  It was cut to the appropriate length and occluded with serrefine clamp.  Next, the artery was occluded with straight serrefine and #11 blade was used to make an arteriotomy.  Potts scissors were used to extend it longitudinally.  The vein was then spatulated to fit the size of the arteriotomy and end-to-side anastomosis was created with a running 6-0 Prolene.  Prior to completion, appropriate flush maneuvers were performed, the anastomosis was completed.  One mattress 6-0 Prolene was required to oversew a branch that had been divided on the cephalic vein.  There was an excellent thrill within the fistula throughout the upper arm.  The patient had preservation of a palpable radial pulse. Heparin was reversed with 25 mg of protamine.  Subcutaneous tissue was reapproximated with 3- 0 Vicryl, skin was closed with 3-0  Vicryl.  Dermabond was placed to the wound.  The patient tolerated the procedure well.  There were no complications.     Eldridge Abrahams, MD     VWB/MEDQ  D:  01/28/2011  T:  01/29/2011  Job:  [id number]  Electronically Signed by Orvan Falconer IV MD on 03/12/2011 12:03:15 AM

## 2011-03-16 LAB — POCT HEMOGLOBIN-HEMACUE: Hemoglobin: 10.5 g/dL — ABNORMAL LOW (ref 13.0–17.0)

## 2011-03-18 ENCOUNTER — Other Ambulatory Visit: Payer: Self-pay | Admitting: Nephrology

## 2011-03-18 ENCOUNTER — Encounter (HOSPITAL_COMMUNITY): Payer: Medicaid Other | Attending: Nephrology

## 2011-03-18 DIAGNOSIS — D638 Anemia in other chronic diseases classified elsewhere: Secondary | ICD-10-CM | POA: Insufficient documentation

## 2011-03-18 DIAGNOSIS — I120 Hypertensive chronic kidney disease with stage 5 chronic kidney disease or end stage renal disease: Secondary | ICD-10-CM | POA: Insufficient documentation

## 2011-03-18 DIAGNOSIS — N185 Chronic kidney disease, stage 5: Secondary | ICD-10-CM | POA: Insufficient documentation

## 2011-03-18 LAB — POCT HEMOGLOBIN-HEMACUE: Hemoglobin: 10.8 g/dL — ABNORMAL LOW (ref 13.0–17.0)

## 2011-03-25 ENCOUNTER — Encounter (HOSPITAL_COMMUNITY): Payer: Medicaid Other

## 2011-03-25 ENCOUNTER — Other Ambulatory Visit: Payer: Self-pay | Admitting: Nephrology

## 2011-03-25 LAB — POCT HEMOGLOBIN-HEMACUE: Hemoglobin: 11.8 g/dL — ABNORMAL LOW (ref 13.0–17.0)

## 2011-04-08 ENCOUNTER — Encounter (HOSPITAL_COMMUNITY): Payer: Medicaid Other

## 2011-04-08 ENCOUNTER — Other Ambulatory Visit: Payer: Self-pay | Admitting: Nephrology

## 2011-04-08 LAB — POCT HEMOGLOBIN-HEMACUE: Hemoglobin: 11.7 g/dL — ABNORMAL LOW (ref 13.0–17.0)

## 2011-04-14 ENCOUNTER — Emergency Department (HOSPITAL_COMMUNITY)
Admission: EM | Admit: 2011-04-14 | Discharge: 2011-04-14 | Discharge status: Against Medical Advice | Payer: Medicaid Other | Attending: Emergency Medicine | Admitting: Emergency Medicine

## 2011-04-14 DIAGNOSIS — Z0389 Encounter for observation for other suspected diseases and conditions ruled out: Secondary | ICD-10-CM | POA: Insufficient documentation

## 2011-04-22 ENCOUNTER — Encounter (HOSPITAL_COMMUNITY): Payer: Medicaid Other | Attending: Nephrology

## 2011-04-22 DIAGNOSIS — I120 Hypertensive chronic kidney disease with stage 5 chronic kidney disease or end stage renal disease: Secondary | ICD-10-CM | POA: Insufficient documentation

## 2011-04-22 DIAGNOSIS — D638 Anemia in other chronic diseases classified elsewhere: Secondary | ICD-10-CM | POA: Insufficient documentation

## 2011-04-22 DIAGNOSIS — N185 Chronic kidney disease, stage 5: Secondary | ICD-10-CM | POA: Insufficient documentation

## 2011-04-28 ENCOUNTER — Other Ambulatory Visit: Payer: Self-pay | Admitting: Nephrology

## 2011-04-28 ENCOUNTER — Encounter (HOSPITAL_COMMUNITY): Payer: Medicaid Other

## 2011-04-28 LAB — RENAL FUNCTION PANEL
Albumin: 3.2 g/dL — ABNORMAL LOW (ref 3.5–5.2)
BUN: 107 mg/dL — ABNORMAL HIGH (ref 6–23)
CO2: 23 mEq/L (ref 19–32)
Calcium: 8.9 mg/dL (ref 8.4–10.5)
Chloride: 102 mEq/L (ref 96–112)
Creatinine, Ser: 7.58 mg/dL — ABNORMAL HIGH (ref 0.50–1.35)
GFR calc Af Amer: 9 mL/min — ABNORMAL LOW (ref >90)
GFR calc non Af Amer: 8 mL/min — ABNORMAL LOW (ref >90)
Glucose, Bld: 115 mg/dL — ABNORMAL HIGH (ref 70–99)
Phosphorus: 6.1 mg/dL — ABNORMAL HIGH (ref 2.3–4.6)
Potassium: 4.3 mEq/L (ref 3.5–5.1)
Sodium: 139 mEq/L (ref 135–145)

## 2011-04-28 LAB — MAGNESIUM: Magnesium: 1.9 mg/dL (ref 1.5–2.5)

## 2011-04-28 LAB — IRON AND TIBC
Iron: 103 ug/dL (ref 42–135)
Saturation Ratios: 31 % (ref 20–55)
TIBC: 330 ug/dL (ref 215–435)
UIBC: 227 ug/dL (ref 125–400)

## 2011-04-28 LAB — FERRITIN: Ferritin: 440 ng/mL — ABNORMAL HIGH (ref 22–322)

## 2011-04-28 LAB — POCT HEMOGLOBIN-HEMACUE: Hemoglobin: 10.5 g/dL — ABNORMAL LOW (ref 13.0–17.0)

## 2011-04-29 LAB — PTH, INTACT AND CALCIUM
Calcium, Total (PTH): 8.5 mg/dL (ref 8.4–10.5)
PTH: 432.7 pg/mL — ABNORMAL HIGH (ref 14.0–72.0)

## 2011-05-05 ENCOUNTER — Encounter (HOSPITAL_COMMUNITY)
Admission: RE | Admit: 2011-05-05 | Discharge: 2011-05-05 | Payer: Medicaid Other | Source: Ambulatory Visit | Attending: Nephrology | Admitting: Nephrology

## 2011-05-05 ENCOUNTER — Other Ambulatory Visit: Payer: Self-pay | Admitting: Nephrology

## 2011-05-05 LAB — POCT HEMOGLOBIN-HEMACUE: Hemoglobin: 10.4 g/dL — ABNORMAL LOW (ref 13.0–17.0)

## 2011-05-12 ENCOUNTER — Other Ambulatory Visit: Payer: Self-pay | Admitting: Nephrology

## 2011-05-12 ENCOUNTER — Encounter (HOSPITAL_COMMUNITY): Payer: Self-pay

## 2011-05-12 ENCOUNTER — Encounter (HOSPITAL_COMMUNITY)
Admission: RE | Admit: 2011-05-12 | Discharge: 2011-05-12 | Disposition: A | Discharge status: Home / Self Care | Payer: Medicaid Other | Source: Ambulatory Visit | Attending: Nephrology | Admitting: Nephrology

## 2011-05-12 DIAGNOSIS — D638 Anemia in other chronic diseases classified elsewhere: Secondary | ICD-10-CM | POA: Insufficient documentation

## 2011-05-12 DIAGNOSIS — N185 Chronic kidney disease, stage 5: Secondary | ICD-10-CM | POA: Insufficient documentation

## 2011-05-12 DIAGNOSIS — I120 Hypertensive chronic kidney disease with stage 5 chronic kidney disease or end stage renal disease: Secondary | ICD-10-CM | POA: Insufficient documentation

## 2011-05-12 LAB — POCT HEMOGLOBIN-HEMACUE: Hemoglobin: 9.8 g/dL — ABNORMAL LOW (ref 13.0–17.0)

## 2011-05-19 ENCOUNTER — Encounter (HOSPITAL_COMMUNITY)
Admission: RE | Admit: 2011-05-19 | Discharge: 2011-05-19 | Disposition: A | Discharge status: Home / Self Care | Payer: Medicaid Other | Source: Ambulatory Visit | Attending: Nephrology | Admitting: Nephrology

## 2011-05-19 DIAGNOSIS — I120 Hypertensive chronic kidney disease with stage 5 chronic kidney disease or end stage renal disease: Secondary | ICD-10-CM | POA: Insufficient documentation

## 2011-05-19 DIAGNOSIS — N185 Chronic kidney disease, stage 5: Secondary | ICD-10-CM | POA: Insufficient documentation

## 2011-05-19 DIAGNOSIS — D638 Anemia in other chronic diseases classified elsewhere: Secondary | ICD-10-CM | POA: Insufficient documentation

## 2011-05-19 LAB — MAGNESIUM: Magnesium: 1.8 mg/dL (ref 1.5–2.5)

## 2011-05-19 LAB — POCT HEMOGLOBIN-HEMACUE: Hemoglobin: 11.2 g/dL — ABNORMAL LOW (ref 13.0–17.0)

## 2011-05-19 MED ORDER — EPOETIN ALFA 10000 UNIT/ML IJ SOLN
20000.0000 [IU] | INTRAMUSCULAR | Status: DC
  Administered 2011-05-19: 20000 [IU] via SUBCUTANEOUS

## 2011-05-19 MED ORDER — EPOETIN ALFA 20000 UNIT/ML IJ SOLN
INTRAMUSCULAR | Status: AC
  Filled 2011-05-19: qty 1

## 2011-05-20 LAB — VITAMIN D 25 HYDROXY (VIT D DEFICIENCY, FRACTURES): Vit D, 25-Hydroxy: 31 ng/mL (ref 30–89)

## 2011-05-26 ENCOUNTER — Encounter (HOSPITAL_COMMUNITY)
Admission: RE | Admit: 2011-05-26 | Discharge: 2011-05-26 | Disposition: A | Discharge status: Home / Self Care | Payer: Medicaid Other | Source: Ambulatory Visit | Attending: Nephrology | Admitting: Nephrology

## 2011-05-26 LAB — IRON AND TIBC
Iron: 96 ug/dL (ref 42–135)
Saturation Ratios: 31 % (ref 20–55)
TIBC: 313 ug/dL (ref 215–435)
UIBC: 217 ug/dL (ref 125–400)

## 2011-05-26 LAB — RENAL FUNCTION PANEL
Albumin: 2.7 g/dL — ABNORMAL LOW (ref 3.5–5.2)
BUN: 102 mg/dL — ABNORMAL HIGH (ref 6–23)
CO2: 19 mEq/L (ref 19–32)
Calcium: 8.8 mg/dL (ref 8.4–10.5)
Chloride: 100 mEq/L (ref 96–112)
Creatinine, Ser: 8.26 mg/dL — ABNORMAL HIGH (ref 0.50–1.35)
GFR calc Af Amer: 8 mL/min — ABNORMAL LOW (ref >90)
GFR calc non Af Amer: 7 mL/min — ABNORMAL LOW (ref >90)
Glucose, Bld: 143 mg/dL — ABNORMAL HIGH (ref 70–99)
Phosphorus: 6.1 mg/dL — ABNORMAL HIGH (ref 2.3–4.6)
Potassium: 3.1 mEq/L — ABNORMAL LOW (ref 3.5–5.1)
Sodium: 135 mEq/L (ref 135–145)

## 2011-05-26 LAB — POCT HEMOGLOBIN-HEMACUE: Hemoglobin: 10.5 g/dL — ABNORMAL LOW (ref 13.0–17.0)

## 2011-05-26 LAB — FERRITIN: Ferritin: 267 ng/mL (ref 22–322)

## 2011-05-26 MED ORDER — CLONIDINE HCL 0.1 MG PO TABS
0.1000 mg | ORAL_TABLET | Freq: Once | ORAL | Status: AC | PRN
  Administered 2011-05-26: 0.1 mg via ORAL

## 2011-05-26 MED ORDER — CLONIDINE HCL 0.1 MG PO TABS
ORAL_TABLET | ORAL | Status: AC
  Filled 2011-05-26: qty 1

## 2011-05-26 MED ORDER — EPOETIN ALFA 20000 UNIT/ML IJ SOLN
INTRAMUSCULAR | Status: AC
  Administered 2011-05-26: 20000 [IU] via SUBCUTANEOUS
  Filled 2011-05-26: qty 1

## 2011-05-26 MED ORDER — EPOETIN ALFA 10000 UNIT/ML IJ SOLN: 20000.0000 [IU] | INTRAMUSCULAR | Status: DC

## 2011-05-26 MED ORDER — CLONIDINE HCL 0.1 MG PO TABS
0.1000 mg | ORAL_TABLET | Freq: Once | ORAL | Status: DC
  Administered 2011-05-26: 0.1 mg via ORAL

## 2011-05-26 NOTE — Progress Notes (Addendum)
Jeanmarie Plant CMA from Dr. Serita Grit office notified of pt's elevated BP. Order received to repeat 0.1 mg Clonidine and to check BP in 30 minutes. If BP [id number] systolic then may administer injection. IF BP [id number] systolic hold shot.

## 2011-06-02 ENCOUNTER — Encounter (HOSPITAL_COMMUNITY)
Admission: RE | Admit: 2011-06-02 | Discharge: 2011-06-02 | Disposition: A | Discharge status: Home / Self Care | Payer: Medicaid Other | Source: Ambulatory Visit | Attending: Nephrology | Admitting: Nephrology

## 2011-06-02 MED ORDER — CLONIDINE HCL 0.1 MG PO TABS
ORAL_TABLET | ORAL | Status: AC
  Administered 2011-06-02: 0.2 mg via ORAL
  Filled 2011-06-02: qty 2

## 2011-06-02 MED ORDER — EPOETIN ALFA 10000 UNIT/ML IJ SOLN: 20000.0000 [IU] | INTRAMUSCULAR | Status: DC

## 2011-06-02 MED ORDER — CLONIDINE HCL 0.1 MG PO TABS
ORAL_TABLET | ORAL | Status: AC
  Filled 2011-06-02: qty 1

## 2011-06-02 MED ORDER — CLONIDINE HCL 0.1 MG PO TABS
0.2000 mg | ORAL_TABLET | Freq: Once | ORAL | Status: AC
  Administered 2011-06-02: 0.2 mg via ORAL

## 2011-06-02 MED ORDER — CLONIDINE HCL 0.1 MG PO TABS
0.1000 mg | ORAL_TABLET | Freq: Once | ORAL | Status: AC | PRN
  Administered 2011-06-02: 0.1 mg via ORAL

## 2011-06-07 LAB — POCT HEMOGLOBIN-HEMACUE: Hemoglobin: 11.8 g/dL — ABNORMAL LOW (ref 13.0–17.0)

## 2011-06-09 ENCOUNTER — Encounter (HOSPITAL_COMMUNITY): Admission: RE | Admit: 2011-06-09 | Payer: Medicaid Other | Source: Ambulatory Visit

## 2011-06-16 ENCOUNTER — Encounter (HOSPITAL_COMMUNITY)
Admission: RE | Admit: 2011-06-16 | Discharge: 2011-06-16 | Disposition: A | Discharge status: Home / Self Care | Payer: Medicaid Other | Source: Ambulatory Visit | Attending: Nephrology | Admitting: Nephrology

## 2011-06-16 DIAGNOSIS — D638 Anemia in other chronic diseases classified elsewhere: Secondary | ICD-10-CM | POA: Insufficient documentation

## 2011-06-16 DIAGNOSIS — I120 Hypertensive chronic kidney disease with stage 5 chronic kidney disease or end stage renal disease: Secondary | ICD-10-CM | POA: Insufficient documentation

## 2011-06-16 DIAGNOSIS — N185 Chronic kidney disease, stage 5: Secondary | ICD-10-CM | POA: Insufficient documentation

## 2011-06-16 LAB — POCT HEMOGLOBIN-HEMACUE: Hemoglobin: 11.5 g/dL — ABNORMAL LOW (ref 13.0–17.0)

## 2011-06-16 MED ORDER — EPOETIN ALFA 10000 UNIT/ML IJ SOLN: 20000.0000 [IU] | INTRAMUSCULAR | Status: DC

## 2011-06-16 MED ORDER — CLONIDINE HCL 0.1 MG PO TABS
0.1000 mg | ORAL_TABLET | Freq: Once | ORAL | Status: AC | PRN
  Administered 2011-06-16: 0.1 mg via ORAL

## 2011-06-16 MED ORDER — CLONIDINE HCL 0.1 MG PO TABS
0.1000 mg | ORAL_TABLET | Freq: Once | ORAL | Status: AC
  Administered 2011-06-16: 0.1 mg via ORAL

## 2011-06-16 MED ORDER — CLONIDINE HCL 0.1 MG PO TABS
ORAL_TABLET | ORAL | Status: AC
  Administered 2011-06-16: 0.1 mg via ORAL
  Filled 2011-06-16: qty 2

## 2011-06-23 ENCOUNTER — Encounter (HOSPITAL_COMMUNITY): Admission: RE | Admit: 2011-06-23 | Payer: Medicaid Other | Source: Ambulatory Visit

## 2011-06-25 ENCOUNTER — Encounter (HOSPITAL_COMMUNITY)
Admission: RE | Admit: 2011-06-25 | Discharge: 2011-06-25 | Disposition: A | Discharge status: Home / Self Care | Payer: Medicaid Other | Source: Ambulatory Visit | Attending: Nephrology | Admitting: Nephrology

## 2011-06-25 LAB — RENAL FUNCTION PANEL
Albumin: 2.6 g/dL — ABNORMAL LOW (ref 3.5–5.2)
BUN: 114 mg/dL — ABNORMAL HIGH (ref 6–23)
CO2: 14 mEq/L — ABNORMAL LOW (ref 19–32)
Calcium: 7.6 mg/dL — ABNORMAL LOW (ref 8.4–10.5)
Chloride: 106 mEq/L (ref 96–112)
Creatinine, Ser: 11.43 mg/dL — ABNORMAL HIGH (ref 0.50–1.35)
GFR calc Af Amer: 5 mL/min — ABNORMAL LOW (ref >90)
GFR calc non Af Amer: 5 mL/min — ABNORMAL LOW (ref >90)
Glucose, Bld: 122 mg/dL — ABNORMAL HIGH (ref 70–99)
Phosphorus: 6.6 mg/dL — ABNORMAL HIGH (ref 2.3–4.6)
Potassium: 3.6 mEq/L (ref 3.5–5.1)
Sodium: 137 mEq/L (ref 135–145)

## 2011-06-25 LAB — HEPATITIS B SURFACE ANTIGEN: Hepatitis B Surface Ag: POSITIVE — AB

## 2011-06-25 LAB — FERRITIN: Ferritin: 550 ng/mL — ABNORMAL HIGH (ref 22–322)

## 2011-06-25 LAB — POCT HEMOGLOBIN-HEMACUE: Hemoglobin: 11 g/dL — ABNORMAL LOW (ref 13.0–17.0)

## 2011-06-25 LAB — IRON AND TIBC
Iron: 116 ug/dL (ref 42–135)
Saturation Ratios: 42 % (ref 20–55)
TIBC: 277 ug/dL (ref 215–435)
UIBC: 161 ug/dL (ref 125–400)

## 2011-06-25 MED ORDER — EPOETIN ALFA 20000 UNIT/ML IJ SOLN
INTRAMUSCULAR | Status: AC
  Administered 2011-06-25: 20000 [IU]
  Filled 2011-06-25: qty 1

## 2011-06-25 MED ORDER — EPOETIN ALFA 10000 UNIT/ML IJ SOLN: 20000.0000 [IU] | INTRAMUSCULAR | Status: DC

## 2011-06-30 ENCOUNTER — Encounter (HOSPITAL_COMMUNITY): Payer: Medicaid Other

## 2011-07-07 ENCOUNTER — Encounter (HOSPITAL_COMMUNITY): Payer: Medicaid Other

## 2011-07-09 ENCOUNTER — Emergency Department (HOSPITAL_COMMUNITY)
Admission: EM | Admit: 2011-07-09 | Discharge: 2011-07-10 | Disposition: A | Discharge status: Home / Self Care | Payer: Medicaid Other | Attending: Emergency Medicine | Admitting: Emergency Medicine

## 2011-07-09 ENCOUNTER — Encounter (HOSPITAL_COMMUNITY): Payer: Self-pay

## 2011-07-09 ENCOUNTER — Emergency Department (HOSPITAL_COMMUNITY): Payer: Medicaid Other

## 2011-07-09 DIAGNOSIS — N186 End stage renal disease: Secondary | ICD-10-CM | POA: Insufficient documentation

## 2011-07-09 DIAGNOSIS — Z8619 Personal history of other infectious and parasitic diseases: Secondary | ICD-10-CM | POA: Insufficient documentation

## 2011-07-09 DIAGNOSIS — I120 Hypertensive chronic kidney disease with stage 5 chronic kidney disease or end stage renal disease: Secondary | ICD-10-CM | POA: Insufficient documentation

## 2011-07-09 DIAGNOSIS — Z79899 Other long term (current) drug therapy: Secondary | ICD-10-CM | POA: Insufficient documentation

## 2011-07-09 DIAGNOSIS — L2989 Other pruritus: Secondary | ICD-10-CM | POA: Insufficient documentation

## 2011-07-09 DIAGNOSIS — K219 Gastro-esophageal reflux disease without esophagitis: Secondary | ICD-10-CM | POA: Insufficient documentation

## 2011-07-09 DIAGNOSIS — L298 Other pruritus: Secondary | ICD-10-CM | POA: Insufficient documentation

## 2011-07-09 DIAGNOSIS — L299 Pruritus, unspecified: Secondary | ICD-10-CM

## 2011-07-09 HISTORY — DX: Disorder of kidney and ureter, unspecified: N28.9

## 2011-07-09 LAB — CBC
HCT: 32.6 % — ABNORMAL LOW (ref 39.0–52.0)
Hemoglobin: 11.7 g/dL — ABNORMAL LOW (ref 13.0–17.0)
MCH: 27.7 pg (ref 26.0–34.0)
MCHC: 35.9 g/dL (ref 30.0–36.0)
MCV: 77.1 fL — ABNORMAL LOW (ref 78.0–100.0)
Platelets: 144 10*3/uL — ABNORMAL LOW (ref 150–400)
RBC: 4.23 MIL/uL (ref 4.22–5.81)
RDW: 16.2 % — ABNORMAL HIGH (ref 11.5–15.5)
WBC: 7.3 10*3/uL (ref 4.0–10.5)

## 2011-07-09 LAB — DIFFERENTIAL
Basophils Absolute: 0 10*3/uL (ref 0.0–0.1)
Basophils Relative: 0 % (ref 0–1)
Eosinophils Absolute: 0.4 10*3/uL (ref 0.0–0.7)
Eosinophils Relative: 5 % (ref 0–5)
Lymphocytes Relative: 35 % (ref 12–46)
Lymphs Abs: 2.6 10*3/uL (ref 0.7–4.0)
Monocytes Absolute: 0.7 10*3/uL (ref 0.1–1.0)
Monocytes Relative: 9 % (ref 3–12)
Neutro Abs: 3.6 10*3/uL (ref 1.7–7.7)
Neutrophils Relative %: 51 % (ref 43–77)

## 2011-07-09 LAB — BASIC METABOLIC PANEL
BUN: 106 mg/dL — ABNORMAL HIGH (ref 6–23)
CO2: 16 mEq/L — ABNORMAL LOW (ref 19–32)
Calcium: 8.2 mg/dL — ABNORMAL LOW (ref 8.4–10.5)
Chloride: 103 mEq/L (ref 96–112)
Creatinine, Ser: 10.19 mg/dL — ABNORMAL HIGH (ref 0.50–1.35)
GFR calc Af Amer: 6 mL/min — ABNORMAL LOW (ref >90)
GFR calc non Af Amer: 5 mL/min — ABNORMAL LOW (ref >90)
Glucose, Bld: 167 mg/dL — ABNORMAL HIGH (ref 70–99)
Potassium: 3.2 mEq/L — ABNORMAL LOW (ref 3.5–5.1)
Sodium: 135 mEq/L (ref 135–145)

## 2011-07-09 NOTE — ED Notes (Signed)
Patient transported to X-ray 

## 2011-07-09 NOTE — ED Notes (Signed)
Pt. Was instructed by Dr. Posey Pronto at [facility] that he needed to come today to start his first dialysis treatment,  Pt. Is having intermittent abdominal pain,  Skin is very itchy and bad taste in his mouth. Denies any pain at present time

## 2011-07-09 NOTE — ED Notes (Signed)
Spoke with [name] at out dialysis on 6700,  Pt. Was not on the list for today, will have to be admitted, and have dialysis orders.

## 2011-07-10 MED ORDER — HYDROXYZINE HCL 25 MG PO TABS
50.0000 mg | ORAL_TABLET | Freq: Once | ORAL | Status: AC
  Administered 2011-07-10: 50 mg via ORAL
  Filled 2011-07-10: qty 2

## 2011-07-10 MED ORDER — HYDROXYZINE HCL 25 MG PO TABS: 25.0000 mg | ORAL_TABLET | Freq: Four times a day (QID) | ORAL | Status: AC

## 2011-07-10 NOTE — H&P (Signed)
Date of admission- 07/11/2011 Admitting physician: [name], [facility].  Patient's primary care provider: Delman Diaz, [facility]  Reason for admission: Uremic symptoms prompting need to initiate hemodialysis.  History of presenting illness: [name] is a 50 year old African American man with past medical history significant for progressive chronic kidney disease currently stage V from underlying hypertension as well as chronic hepatitis B and C virus infections. Over the past 6-8 weeks, we have been following him closely at the outpatient clinic for what appeared to be early uremic symptoms and were awaiting initiation of hemodialysis as an outpatient however could not undertake this do to financial obstacles. When seen in the clinic on 07/09/2011, he expressed that he was feeling exceedingly bad with poor appetite, feeling lethargic and not able to sleep or concentrate as well as he would in the past. He also reported some dysgeusia and pruritus all over. He was also now getting tired and frustrated with outpatient management of his anemia of chronic kidney disease. Due to his multiple problems, his compliance with antihypertensive medications as well as binders was faltering. He is now being admitted to the hospital for initiation of dialysis as we await outpatient placement to a center after approval by central admissions.  Past Medical History: 1. Progressive chronic kidney disease, now end-stage renal disease- status post left upper arm arteriovenous fistula in July 2012. 2. Hypertension, suboptimally controlled due to lapses with compliance. 3. Prior history of polysubstance abuse including cocaine and intravenous drugs. 4. Chronic hepatitis B virus infection acquired from problem #3 5. Chronic hepatitis C virus infection acquired from problem #3 6. Prior history of alcohol abuse 7. CK D.-Metabolic bone disease 8. Anemia of chronic kidney disease 9.  Gastroesophageal reflux disease 10. Generalized pruritus, probable atopic dermatitis. 11. Ganglion cyst  History reviewed. No pertinent past surgical history.   Medications 1. Carvedilol 25 mg by mouth twice a day (reports to be only taking once a day) 2. Zemplar 2 mcg by mouth daily (samples through the clinic) 3. Extra strength TUMS 2 tablets with meals 3 times a day 4. when necessary Tylenol 5. Furosemide 40 mg by mouth daily 5. He admits not to be taking the rest of his prescribed antihypertensive medications including doxazosin and clonidine.   Social history: Single and resides by himself at home. He has a very supportive daughter who helps him out with most of his needs. Currently denies any alcohol or illicit drug abuse since June of this year and admits to occasional tobacco use.  Family history: Reports several first-degree family members with hypertension, denies any significant history of renal disease in the family.  Review of systems: Gen. denies fevers chills and reports recent anorexia and malaise. Head neck and ENT systems: Denies any epistaxis, tinnitus, dysphagia, odynophagia, sinus pressure, headache or sore neck. Denies neck stiffness. Ophthalmic: Denies any blurry vision, double vision, photophobia or ocular irritation. Respiratory: Denies any cough, sputum production, wheeze, shortness of breath. Cardiovascular: Denies any chest pain, reports rare palpitations, denies pedal edema, denies claudication. Gastrointestinal: Reports recent nausea, anorexia and occasional abdominal pain. He denies diarrhea, vomiting, melena, hematochezia, hematemesis. Genitourinary: Reports fairly stable urine output, denies dysuria, urgency, frequency or hesitancy. Denies hematuria, reports occasional foamy urine. Dermatological: Diffuse pruritus, reports occasional areas of exfoliation over the flexor aspects of the upper extremities. Reports a sensation of bumps in his  back. Neurological: Reports occasional headaches, sensation of numbness in both lower extremities. Denies any focal weakness, dizziness or disorientation. Psychiatric:  Reports today anxious about upcoming dialysis. He states that he is disinterested in previously pleasurable activities.   Physical Exam: BP 160/81  Pulse 67  Temp(Src) 97.8 F (36.6 C) (Oral)  Resp 16  Ht 5\' 11"  (1.803 m)  Wt 73.029 kg (161 lb)  BMI 22.45 kg/m2  SpO2 100% Gen.: Middle-aged [place] American man of slim build who appears to be in discomfort and itching himself periodically. Head, neck, ENT: Normocephalic and atraumatic, no obvious JVD, no goiter, no palpable lymphadenopathy. Trachea is midline. Ophthalmic: No scleral icterus, conjunctiva appear to be slightly pale. Cardiovascular: Pulse is regular in rate and rhythm heart sounds S1 and S2 accompanied by an ejection systolic murmur. Respiratory: Both lung fields are clear to auscultation, no rales, retractions or rhonchi. Abdomen: Soft, flat, diffusely tender over both upper quadrants, no rebound, no guarding. Bowel sounds are normal. Extremities: No edema palpable over either lower extremity. Skin: Diffusely visible papules with areas of pruritus noted over both upper and lower extremities. Exfoliative rash localized to his right arm antecubital area. Neurological: Oriented to time person and place, no focal deficits, no asterixis. BMET    Component Value Date/Time   NA 135 07/09/2011 2154   K 3.2* 07/09/2011 2154   CL 103 07/09/2011 2154   CO2 16* 07/09/2011 2154   GLUCOSE 167* 07/09/2011 2154   BUN 106* 07/09/2011 2154   CREATININE 10.19* 07/09/2011 2154   CALCIUM 8.2* 07/09/2011 2154   CALCIUM 8.5 04/28/2011 1102   GFRNONAA 5* 07/09/2011 2154   GFRAA 6* 07/09/2011 2154    CBC    Component Value Date/Time   WBC 7.3 07/09/2011 2154   RBC 4.23 07/09/2011 2154   HGB 11.7* 07/09/2011 2154   HCT 32.6* 07/09/2011 2154   PLT 144* 07/09/2011 2154    MCV 77.1* 07/09/2011 2154   MCH 27.7 07/09/2011 2154   MCHC 35.9 07/09/2011 2154   RDW 16.2* 07/09/2011 2154   LYMPHSABS 2.6 07/09/2011 2154   MONOABS 0.7 07/09/2011 2154   EOSABS 0.4 07/09/2011 2154   BASOSABS 0.0 07/09/2011 2154    Results for FRANCISO, DINGUS (MRN [id number]) as of 07/10/2011 18:17   06/09/2009 20:24 12/23/2010 05:00 06/25/2011 08:37  Hep A IgM   NEGATIVE   Hep A Total Ab  POS (A)    Hepatitis B Surface Ag  POS (A) POSITIVE (A) POSITIVE (A)  Hep B S Ab  NEG    Hep B Core Total Ab  POS (A)    Hep B C IgM   NEGATIVE   HCV Ab  REACTIVE (A) Reactive (A)   HIV   NON REACTIVE    Results for AZARIYAH, RAMUS (MRN [id number]) as of 07/10/2011 18:17  Ref. Range 06/25/2011 08:37  Iron Latest Range: 42-135 ug/dL 116  UIBC Latest Range: 125-400 ug/dL 161  TIBC Latest Range: 215-435 ug/dL 277  Saturation Ratios Latest Range: 20-55 % 42  Ferritin Latest Range: 22-322 ng/mL 550 (H)   Results for TRYSTON, NOBIS (MRN [id number]) as of 07/10/2011 18:17  Ref. Range 05/19/2011 10:55  Vit D, 25-Hydroxy Latest Range: 30-89 ng/mL 31   Results for ARAFAT, WINKLE (MRN [id number]) as of 07/10/2011 18:17  Ref. Range 04/28/2011 11:02  PTH Latest Range: 14.0-72.0 pg/mL 432.7 (H)   07/09/2011*RADIOLOGY REPORT* CHEST - 2 VIEW  Findings: Shallow inspiration with elevation of left hemidiaphragm.  Fibrosis in the lung bases. Normal heart size and pulmonary  vascularity. Probable mild emphysematous changes. No focal  airspace consolidation in the  lungs. No blunting of costophrenic  angles. No pneumothorax.  IMPRESSION:  No evidence of active pulmonary disease. No significant change  since prior study.  Assessment and plan: 1. Uremic symptoms, progressive chronic kidney disease-now end-stage renal disease. Admit the patient was not telemetry bed for initiation of hemodialysis. We'll begin with low and short dialysis in order to prevent disequilibrium syndrome. He has a  functional left upper arm arteriovenous fistula that should be suitable for cannulation with 17-gauge needles. The process towards placing him at an outpatient dialysis center will be continued. (This was originally initiated as an outpatient from the clinic). No acute electrolyte issues noted to prompt intervention. Volume status appears to be fair. Precautions to be used as he is serologically hepatitis B positive.  2.Hypertension: Restart outpatient medications and anticipate some improvement with hemodialysis/ultrafiltration. His volume status does not indicate florid edema and the ultrafiltration efforts will be slow. We'll place him on a renal diet with restricting his sodium intake.  3 Anemia of chronic kidney disease: Current hemoglobin is at goal, we will start him on low doses of darbepoetin for maintenance of this. No overt loss is noted.  4. Metabolic bone disease: Initiate intravenous Zemplar therapy and optimize compliance with binders. Optimize binders to keep phosphorus less than 5.5. No indications noted as yet for Sensipar therapy.  5. Hypokalemia/metabolic acidosis:  Due to ongoing diuretics for the former and progressive chronic kidney disease for the latter-to be corrected with dialysis.  Joshua Shiley MD [facility]. Office # [phone] Pager # [phone] 6:34 PM

## 2011-07-11 ENCOUNTER — Emergency Department (HOSPITAL_COMMUNITY)
Admission: EM | Admit: 2011-07-11 | Discharge: 2011-07-11 | Disposition: A | Discharge status: Home / Self Care | Payer: Self-pay

## 2011-07-11 ENCOUNTER — Encounter (HOSPITAL_COMMUNITY): Payer: Self-pay | Admitting: *Deleted

## 2011-07-11 ENCOUNTER — Inpatient Hospital Stay (HOSPITAL_COMMUNITY): Payer: Medicaid Other

## 2011-07-11 ENCOUNTER — Inpatient Hospital Stay (HOSPITAL_COMMUNITY)
Admission: AD | Admit: 2011-07-11 | Discharge: 2011-07-16 | DRG: 685 | Disposition: A | Discharge status: Home / Self Care | Payer: Medicaid Other | Source: Ambulatory Visit | Attending: Nephrology | Admitting: Nephrology

## 2011-07-11 DIAGNOSIS — B182 Chronic viral hepatitis C: Secondary | ICD-10-CM | POA: Diagnosis present

## 2011-07-11 DIAGNOSIS — D631 Anemia in chronic kidney disease: Secondary | ICD-10-CM | POA: Diagnosis present

## 2011-07-11 DIAGNOSIS — I953 Hypotension of hemodialysis: Secondary | ICD-10-CM | POA: Diagnosis present

## 2011-07-11 DIAGNOSIS — M899 Disorder of bone, unspecified: Secondary | ICD-10-CM | POA: Diagnosis present

## 2011-07-11 DIAGNOSIS — N186 End stage renal disease: Secondary | ICD-10-CM | POA: Diagnosis present

## 2011-07-11 DIAGNOSIS — I120 Hypertensive chronic kidney disease with stage 5 chronic kidney disease or end stage renal disease: Secondary | ICD-10-CM | POA: Diagnosis present

## 2011-07-11 DIAGNOSIS — B181 Chronic viral hepatitis B without delta-agent: Secondary | ICD-10-CM | POA: Diagnosis present

## 2011-07-11 DIAGNOSIS — Z992 Dependence on renal dialysis: Principal | ICD-10-CM

## 2011-07-11 DIAGNOSIS — L299 Pruritus, unspecified: Secondary | ICD-10-CM | POA: Diagnosis present

## 2011-07-11 HISTORY — DX: Gastro-esophageal reflux disease without esophagitis: K21.9

## 2011-07-11 LAB — CBC
HCT: 29.5 % — ABNORMAL LOW (ref 39.0–52.0)
Hemoglobin: 10.6 g/dL — ABNORMAL LOW (ref 13.0–17.0)
MCH: 27.6 pg (ref 26.0–34.0)
MCHC: 35.9 g/dL (ref 30.0–36.0)
MCV: 76.8 fL — ABNORMAL LOW (ref 78.0–100.0)
Platelets: 169 10*3/uL (ref 150–400)
RBC: 3.84 MIL/uL — ABNORMAL LOW (ref 4.22–5.81)
RDW: 16 % — ABNORMAL HIGH (ref 11.5–15.5)
WBC: 5.9 10*3/uL (ref 4.0–10.5)

## 2011-07-11 LAB — RENAL FUNCTION PANEL
Albumin: 2.7 g/dL — ABNORMAL LOW (ref 3.5–5.2)
BUN: 105 mg/dL — ABNORMAL HIGH (ref 6–23)
CO2: 14 mEq/L — ABNORMAL LOW (ref 19–32)
Calcium: 8.2 mg/dL — ABNORMAL LOW (ref 8.4–10.5)
Chloride: 104 mEq/L (ref 96–112)
Creatinine, Ser: 10.05 mg/dL — ABNORMAL HIGH (ref 0.50–1.35)
GFR calc Af Amer: 6 mL/min — ABNORMAL LOW (ref >90)
GFR calc non Af Amer: 5 mL/min — ABNORMAL LOW (ref >90)
Glucose, Bld: 138 mg/dL — ABNORMAL HIGH (ref 70–99)
Phosphorus: 5 mg/dL — ABNORMAL HIGH (ref 2.3–4.6)
Potassium: 3.9 mEq/L (ref 3.5–5.1)
Sodium: 136 mEq/L (ref 135–145)

## 2011-07-11 LAB — ALT: ALT: 62 U/L — ABNORMAL HIGH (ref 0–53)

## 2011-07-11 LAB — HEPATITIS B CORE ANTIBODY, TOTAL: Hep B Core Total Ab: POSITIVE — AB

## 2011-07-11 LAB — HEPATITIS B SURFACE ANTIGEN: Hepatitis B Surface Ag: POSITIVE — AB

## 2011-07-11 LAB — HEPATITIS B SURFACE ANTIBODY,QUALITATIVE: Hep B S Ab: NEGATIVE

## 2011-07-11 MED ORDER — PARICALCITOL 5 MCG/ML IV SOLN
4.0000 ug | INTRAVENOUS | Status: DC
  Administered 2011-07-11 – 2011-07-16 (×3): 4 ug via INTRAVENOUS
  Filled 2011-07-11 (×3): qty 0.8

## 2011-07-11 MED ORDER — ALTEPLASE 2 MG IJ SOLR
2.0000 mg | Freq: Once | INTRAMUSCULAR | Status: AC | PRN
  Filled 2011-07-11: qty 2

## 2011-07-11 MED ORDER — ACETAMINOPHEN 325 MG PO TABS: 650.0000 mg | ORAL_TABLET | Freq: Four times a day (QID) | ORAL | Status: DC | PRN

## 2011-07-11 MED ORDER — ACETAMINOPHEN 325 MG PO TABS
ORAL_TABLET | ORAL | Status: AC
  Filled 2011-07-11: qty 2

## 2011-07-11 MED ORDER — LIDOCAINE-PRILOCAINE 2.5-2.5 % EX CREA: 1.0000 "application " | TOPICAL_CREAM | CUTANEOUS | Status: DC | PRN

## 2011-07-11 MED ORDER — CALCIUM ACETATE 667 MG PO CAPS
1334.0000 mg | ORAL_CAPSULE | Freq: Three times a day (TID) | ORAL | Status: DC
  Administered 2011-07-11 – 2011-07-16 (×14): 1334 mg via ORAL
  Filled 2011-07-11 (×18): qty 2

## 2011-07-11 MED ORDER — SODIUM CHLORIDE 0.9 % IJ SOLN: 3.0000 mL | INTRAMUSCULAR | Status: DC | PRN

## 2011-07-11 MED ORDER — ONDANSETRON HCL 4 MG/2ML IJ SOLN: 4.0000 mg | Freq: Four times a day (QID) | INTRAMUSCULAR | Status: DC | PRN

## 2011-07-11 MED ORDER — ONDANSETRON HCL 4 MG PO TABS: 4.0000 mg | ORAL_TABLET | Freq: Four times a day (QID) | ORAL | Status: DC | PRN

## 2011-07-11 MED ORDER — AMLODIPINE BESYLATE 5 MG PO TABS
5.0000 mg | ORAL_TABLET | Freq: Every day | ORAL | Status: DC
  Administered 2011-07-11 – 2011-07-16 (×6): 5 mg via ORAL
  Filled 2011-07-11 (×6): qty 1

## 2011-07-11 MED ORDER — SODIUM CHLORIDE 0.9 % IV SOLN: 100.0000 mL | INTRAVENOUS | Status: DC | PRN

## 2011-07-11 MED ORDER — DARBEPOETIN ALFA-POLYSORBATE 25 MCG/0.42ML IJ SOLN
25.0000 ug | INTRAMUSCULAR | Status: DC
  Administered 2011-07-11: 25 ug via INTRAVENOUS
  Filled 2011-07-11: qty 0.42

## 2011-07-11 MED ORDER — CARVEDILOL 25 MG PO TABS
25.0000 mg | ORAL_TABLET | Freq: Two times a day (BID) | ORAL | Status: DC
  Administered 2011-07-11 – 2011-07-16 (×9): 25 mg via ORAL
  Filled 2011-07-11 (×13): qty 1

## 2011-07-11 MED ORDER — ACETAMINOPHEN 650 MG RE SUPP: 650.0000 mg | Freq: Four times a day (QID) | RECTAL | Status: DC | PRN

## 2011-07-11 MED ORDER — HEPARIN SODIUM (PORCINE) 1000 UNIT/ML DIALYSIS
40.0000 [IU]/kg | INTRAMUSCULAR | Status: DC | PRN
  Filled 2011-07-11: qty 3

## 2011-07-11 MED ORDER — HYDROXYZINE HCL 25 MG PO TABS
25.0000 mg | ORAL_TABLET | Freq: Three times a day (TID) | ORAL | Status: DC | PRN
  Administered 2011-07-11 – 2011-07-15 (×5): 25 mg via ORAL
  Filled 2011-07-11 (×4): qty 1

## 2011-07-11 MED ORDER — HEPARIN SODIUM (PORCINE) 1000 UNIT/ML DIALYSIS
1000.0000 [IU] | INTRAMUSCULAR | Status: DC | PRN
  Filled 2011-07-11: qty 1

## 2011-07-11 MED ORDER — SORBITOL 70 % SOLN
30.0000 mL | Status: DC | PRN
  Administered 2011-07-13 – 2011-07-16 (×2): 30 mL via ORAL
  Filled 2011-07-11 (×4): qty 30

## 2011-07-11 MED ORDER — ZOLPIDEM TARTRATE 5 MG PO TABS
5.0000 mg | ORAL_TABLET | Freq: Every evening | ORAL | Status: DC | PRN
  Administered 2011-07-12 – 2011-07-14 (×3): 5 mg via ORAL
  Filled 2011-07-11 (×3): qty 1

## 2011-07-11 MED ORDER — LIDOCAINE HCL (PF) 1 % IJ SOLN: 5.0000 mL | INTRAMUSCULAR | Status: DC | PRN

## 2011-07-11 MED ORDER — CAMPHOR-MENTHOL 0.5-0.5 % EX LOTN
1.0000 "application " | TOPICAL_LOTION | Freq: Three times a day (TID) | CUTANEOUS | Status: DC | PRN
  Administered 2011-07-13: 1 via TOPICAL
  Filled 2011-07-11 (×2): qty 222

## 2011-07-11 MED ORDER — PENTAFLUOROPROP-TETRAFLUOROETH EX AERO: 1.0000 "application " | INHALATION_SPRAY | CUTANEOUS | Status: DC | PRN

## 2011-07-11 MED ORDER — ZOLPIDEM TARTRATE 5 MG PO TABS: 5.0000 mg | ORAL_TABLET | Freq: Every evening | ORAL | Status: DC | PRN

## 2011-07-11 MED ORDER — CALCIUM CARBONATE 1250 MG/5ML PO SUSP: 500.0000 mg | Freq: Four times a day (QID) | ORAL | Status: DC | PRN

## 2011-07-11 MED ORDER — NEPRO/CARBSTEADY PO LIQD: 237.0000 mL | Freq: Three times a day (TID) | ORAL | Status: DC | PRN

## 2011-07-11 MED ORDER — SODIUM CHLORIDE 0.9 % IJ SOLN
3.0000 mL | Freq: Two times a day (BID) | INTRAMUSCULAR | Status: DC
  Administered 2011-07-12 – 2011-07-16 (×8): 3 mL via INTRAVENOUS

## 2011-07-11 MED ORDER — PARICALCITOL 5 MCG/ML IV SOLN
INTRAVENOUS | Status: AC
  Filled 2011-07-11: qty 1

## 2011-07-11 MED ORDER — ACETAMINOPHEN 325 MG PO TABS
650.0000 mg | ORAL_TABLET | Freq: Four times a day (QID) | ORAL | Status: DC | PRN
  Administered 2011-07-11 – 2011-07-16 (×5): 650 mg via ORAL
  Filled 2011-07-11 (×5): qty 2

## 2011-07-11 MED ORDER — HYDROXYZINE HCL 25 MG PO TABS: 25.0000 mg | ORAL_TABLET | Freq: Three times a day (TID) | ORAL | Status: DC | PRN

## 2011-07-11 MED ORDER — DOCUSATE SODIUM 283 MG RE ENEM: 1.0000 | ENEMA | RECTAL | Status: DC | PRN

## 2011-07-11 MED ORDER — NEPRO/CARBSTEADY PO LIQD: 237.0000 mL | ORAL | Status: DC | PRN

## 2011-07-11 MED ORDER — RENA-VITE PO TABS
1.0000 | ORAL_TABLET | Freq: Every day | ORAL | Status: DC
  Administered 2011-07-11 – 2011-07-16 (×6): 1 via ORAL
  Filled 2011-07-11 (×6): qty 1

## 2011-07-11 MED ORDER — CAMPHOR-MENTHOL 0.5-0.5 % EX LOTN: 1.0000 "application " | TOPICAL_LOTION | Freq: Three times a day (TID) | CUTANEOUS | Status: DC | PRN

## 2011-07-11 MED ORDER — DARBEPOETIN ALFA-POLYSORBATE 25 MCG/0.42ML IJ SOLN
INTRAMUSCULAR | Status: AC
  Filled 2011-07-11: qty 0.42

## 2011-07-11 MED ORDER — ONDANSETRON HCL 4 MG/2ML IJ SOLN
4.0000 mg | Freq: Four times a day (QID) | INTRAMUSCULAR | Status: DC | PRN
  Administered 2011-07-16: 4 mg via INTRAVENOUS

## 2011-07-11 MED ORDER — SORBITOL 70 % SOLN: 30.0000 mL | Status: DC | PRN

## 2011-07-11 MED ORDER — SODIUM CHLORIDE 0.9 % IV SOLN: 250.0000 mL | INTRAVENOUS | Status: DC | PRN

## 2011-07-11 NOTE — Progress Notes (Signed)
On HD via L upper arm AVF.  BP 156/85, Goal 1.8 L.  Hgb 11.7, K 3.2 (on 4K bath) He's HBsAg and HepC +.  Awaitng approval for outpt. HD via [place] office.  He'd prefer [facility] over [facility] Readings from Last 3 Encounters:  07/11/11 71.8 kg (158 lb 4.6 oz)  07/09/11 73.029 kg (161 lb)  07/24/09 73.936 kg (163 lb)

## 2011-07-12 LAB — PTH, INTACT AND CALCIUM
Calcium, Total (PTH): 8.1 mg/dL — ABNORMAL LOW (ref 8.4–10.5)
PTH: 381.8 pg/mL — ABNORMAL HIGH (ref 14.0–72.0)

## 2011-07-12 LAB — MRSA PCR SCREENING: MRSA by PCR: NEGATIVE

## 2011-07-12 NOTE — Progress Notes (Signed)
Patient ID: [name], male   DOB: 01/29/1961, 50 y.o.   MRN: [id number] S:Pt co arm pain at AVF   O: BP 167/83  Pulse 74  Temp(Src) 98.3 F (36.8 C) (Oral)  Resp 18  Ht 5' 9.69" (1.77 m)  Wt 70.5 kg (155 lb 6.8 oz)  BMI 22.50 kg/m2  SpO2 96%  Gen:WD/WN AAM in NAD CVS:RRR no rub Resp:CTA Abd:+BS, soft, NT/ND Ext:no c/c/e, Left AVF +infiltration/tender      . acetaminophen      . amLODipine  5 mg Oral Daily  . calcium acetate  1,334 mg Oral TID WC  . carvedilol  25 mg Oral BID WC  . darbepoetin      . darbepoetin (ARANESP) injection - DIALYSIS  25 mcg Intravenous Q Mon-HD  . multivitamin  1 tablet Oral Daily  . paricalcitol      . paricalcitol  4 mcg Intravenous 3 times weekly  . sodium chloride  3 mL Intravenous Q12H    BMET  Lab 07/11/11 1348 07/09/11 2154  NA 136 135  K 3.9 3.2*  CL 104 103  CO2 14* 16*  GLUCOSE 138* 167*  BUN 105* 106*  CREATININE 10.05* 10.19*  ALB -- --  CALCIUM 8.2* 8.2*  PHOS 5.0* --   CBC  Lab 07/11/11 1349 07/09/11 2154  WBC 5.9 7.3  NEUTROABS -- 3.6  HGB 10.6* 11.7*  HCT 29.5* 32.6*  MCV 76.8* 77.1*  PLT 169 144*    @IMGRELPRIORS @  Assessment/Plan: 1.infiltration of AVF- mild but uncomfortable.  Will hold off on HD today and reeval tomorrow.  If possible will wait for Wed due to holiday 2.ESRD- as above, awaiting outpt spot 3. Anemia:cont with epo 4. CKD-MBD:N/A 5. Nutrition:stable 6. Hypertension:stable Lorriann Hansmann A

## 2011-07-13 LAB — RENAL FUNCTION PANEL
Albumin: 2.6 g/dL — ABNORMAL LOW (ref 3.5–5.2)
BUN: 82 mg/dL — ABNORMAL HIGH (ref 6–23)
CO2: 21 mEq/L (ref 19–32)
Calcium: 8.4 mg/dL (ref 8.4–10.5)
Chloride: 102 mEq/L (ref 96–112)
Creatinine, Ser: 8.94 mg/dL — ABNORMAL HIGH (ref 0.50–1.35)
GFR calc Af Amer: 7 mL/min — ABNORMAL LOW (ref >90)
GFR calc non Af Amer: 6 mL/min — ABNORMAL LOW (ref >90)
Glucose, Bld: 165 mg/dL — ABNORMAL HIGH (ref 70–99)
Phosphorus: 5.3 mg/dL — ABNORMAL HIGH (ref 2.3–4.6)
Potassium: 3.5 mEq/L (ref 3.5–5.1)
Sodium: 136 mEq/L (ref 135–145)

## 2011-07-13 LAB — GLUCOSE, CAPILLARY: Glucose-Capillary: 117 mg/dL — ABNORMAL HIGH (ref 70–99)

## 2011-07-13 MED ORDER — LIDOCAINE-PRILOCAINE 2.5-2.5 % EX CREA
TOPICAL_CREAM | CUTANEOUS | Status: DC
  Filled 2011-07-13: qty 5

## 2011-07-13 MED ORDER — HEPARIN SODIUM (PORCINE) 1000 UNIT/ML DIALYSIS
100.0000 [IU]/kg | INTRAMUSCULAR | Status: DC | PRN
  Administered 2011-07-14: 7100 [IU] via INTRAVENOUS_CENTRAL
  Filled 2011-07-13: qty 8

## 2011-07-13 MED ORDER — CAMPHOR-MENTHOL 0.5-0.5 % EX LOTN: TOPICAL_LOTION | CUTANEOUS | Status: DC | PRN

## 2011-07-13 NOTE — Progress Notes (Signed)
Patient ID: [name], male   DOB: 1960/12/08, 51 y.o.   MRN: [id number] S:still itching   O: BP 179/80  Pulse 80  Temp(Src) 98.3 F (36.8 C) (Oral)  Resp 18  Ht 5' 9.69" (1.77 m)  Wt 70.5 kg (155 lb 6.8 oz)  BMI 22.50 kg/m2  SpO2 96%  Gen:WD, WN, AAM in NAD CVS:RRR Resp:CTA Abd:+BS Ext:no edema, avf +T/B      . amLODipine  5 mg Oral Daily  . calcium acetate  1,334 mg Oral TID WC  . carvedilol  25 mg Oral BID WC  . darbepoetin (ARANESP) injection - DIALYSIS  25 mcg Intravenous Q Mon-HD  . lidocaine-prilocaine   Topical Q M,W,F-HD  . multivitamin  1 tablet Oral Daily  . paricalcitol  4 mcg Intravenous 3 times weekly  . sodium chloride  3 mL Intravenous Q12H    BMET  Lab 07/13/11 0558 07/11/11 1348 07/11/11 1117 07/09/11 2154  NA 136 136 -- 135  K 3.5 3.9 -- 3.2*  CL 102 104 -- 103  CO2 21 14* -- 16*  GLUCOSE 165* 138* -- 167*  BUN 82* 105* -- 106*  CREATININE 8.94* 10.05* -- 10.19*  ALB -- -- -- --  CALCIUM 8.4 8.2* 8.1* 8.2*  PHOS 5.3* 5.0* -- --   CBC  Lab 07/11/11 1349 07/09/11 2154  WBC 5.9 7.3  NEUTROABS -- 3.6  HGB 10.6* 11.7*  HCT 29.5* 32.6*  MCV 76.8* 77.1*  PLT 169 144*    @IMGRELPRIORS @  Assessment/Plan: 1.itching- use sarna lotion and HD tomorro 2.ESRD cont MWF  3. Anemia:on epo 4. vasc access- AVF stable 5. Nutrition:stable 6. Hypertension:stable 7. Dispo- awaiting outpt HD Adeel Guiffre A

## 2011-07-14 ENCOUNTER — Encounter (HOSPITAL_COMMUNITY): Payer: Self-pay

## 2011-07-14 ENCOUNTER — Inpatient Hospital Stay (HOSPITAL_COMMUNITY): Payer: Medicaid Other

## 2011-07-14 LAB — RENAL FUNCTION PANEL
Albumin: 2.7 g/dL — ABNORMAL LOW (ref 3.5–5.2)
BUN: 81 mg/dL — ABNORMAL HIGH (ref 6–23)
CO2: 22 mEq/L (ref 19–32)
Calcium: 8.9 mg/dL (ref 8.4–10.5)
Chloride: 104 mEq/L (ref 96–112)
Creatinine, Ser: 9.17 mg/dL — ABNORMAL HIGH (ref 0.50–1.35)
GFR calc Af Amer: 7 mL/min — ABNORMAL LOW (ref >90)
GFR calc non Af Amer: 6 mL/min — ABNORMAL LOW (ref >90)
Glucose, Bld: 132 mg/dL — ABNORMAL HIGH (ref 70–99)
Phosphorus: 4 mg/dL (ref 2.3–4.6)
Potassium: 3.7 mEq/L (ref 3.5–5.1)
Sodium: 137 mEq/L (ref 135–145)

## 2011-07-14 LAB — CBC
HCT: 29.5 % — ABNORMAL LOW (ref 39.0–52.0)
Hemoglobin: 10.4 g/dL — ABNORMAL LOW (ref 13.0–17.0)
MCH: 27.5 pg (ref 26.0–34.0)
MCHC: 35.3 g/dL (ref 30.0–36.0)
MCV: 78 fL (ref 78.0–100.0)
Platelets: 129 10*3/uL — ABNORMAL LOW (ref 150–400)
RBC: 3.78 MIL/uL — ABNORMAL LOW (ref 4.22–5.81)
RDW: 15.9 % — ABNORMAL HIGH (ref 11.5–15.5)
WBC: 7.5 10*3/uL (ref 4.0–10.5)

## 2011-07-14 MED ORDER — NICOTINE 14 MG/24HR TD PT24
14.0000 mg | MEDICATED_PATCH | TRANSDERMAL | Status: DC
  Administered 2011-07-14 – 2011-07-15 (×2): 14 mg via TRANSDERMAL
  Filled 2011-07-14 (×3): qty 1

## 2011-07-14 MED ORDER — HYDROXYZINE HCL 25 MG PO TABS
ORAL_TABLET | ORAL | Status: AC
  Filled 2011-07-14: qty 1

## 2011-07-14 MED ORDER — PARICALCITOL 5 MCG/ML IV SOLN
INTRAVENOUS | Status: AC
  Administered 2011-07-14: 4 ug via INTRAVENOUS
  Filled 2011-07-14: qty 1

## 2011-07-14 NOTE — Progress Notes (Signed)
Patient ID: [name], male   DOB: 01-03-1961, 51 y.o.   MRN: [id number] S:feels better   O: BP 161/85  Pulse 80  Temp(Src) 98.3 F (36.8 C) (Oral)  Resp 20  Ht 5' 9.69" (1.77 m)  Wt 73.074 kg (161 lb 1.6 oz)  BMI 23.32 kg/m2  SpO2 95%  Gen:NAD CVS:RRR Resp:CTA [id number] Ext:no edema      . amLODipine  5 mg Oral Daily  . calcium acetate  1,334 mg Oral TID WC  . carvedilol  25 mg Oral BID WC  . darbepoetin (ARANESP) injection - DIALYSIS  25 mcg Intravenous Q Mon-HD  . lidocaine-prilocaine   Topical Q M,W,F-HD  . multivitamin  1 tablet Oral Daily  . paricalcitol  4 mcg Intravenous 3 times weekly  . sodium chloride  3 mL Intravenous Q12H    BMET  Lab 07/14/11 1008 07/13/11 0558 07/11/11 1348 07/11/11 1117 07/09/11 2154  NA 137 136 136 -- 135  K 3.7 3.5 3.9 -- 3.2*  CL 104 102 104 -- 103  CO2 22 21 14* -- 16*  GLUCOSE 132* 165* 138* -- 167*  BUN 81* 82* 105* -- 106*  CREATININE 9.17* 8.94* 10.05* -- 10.19*  ALB -- -- -- -- --  CALCIUM 8.9 8.4 8.2* 8.1* 8.2*  PHOS 4.0 5.3* 5.0* -- --   CBC  Lab 07/14/11 1008 07/11/11 1349 07/09/11 2154  WBC 7.5 5.9 7.3  NEUTROABS -- -- 3.6  HGB 10.4* 10.6* 11.7*  HCT 29.5* 29.5* 32.6*  MCV 78.0 76.8* 77.1*  PLT 129* 169 144*    @IMGRELPRIORS @  Assessment/Plan: 1.itching- improved with sarna lotion and HD   2.ESRD cont MWF  3. Anemia:on epo  4. vasc access- AVF stable  5. Nutrition:stable  6. Hypertension:stable  7. Dispo- awaiting outpt HD and will need SW/CM assistance  Overton Boggus A

## 2011-07-14 NOTE — Progress Notes (Signed)
Pt seen on HD.  Ap 60. Vp140. BP 197/95.  No problems with HD.

## 2011-07-15 MED ORDER — HEPARIN SODIUM (PORCINE) 1000 UNIT/ML DIALYSIS
100.0000 [IU]/kg | INTRAMUSCULAR | Status: DC | PRN
  Administered 2011-07-16: 7400 [IU] via INTRAVENOUS_CENTRAL
  Filled 2011-07-15: qty 8

## 2011-07-15 MED ORDER — CLONIDINE HCL 0.3 MG PO TABS
0.3000 mg | ORAL_TABLET | Freq: Once | ORAL | Status: AC
  Administered 2011-07-15: 0.3 mg via ORAL
  Filled 2011-07-15: qty 1

## 2011-07-15 MED ORDER — SIMETHICONE 80 MG PO CHEW
80.0000 mg | CHEWABLE_TABLET | Freq: Four times a day (QID) | ORAL | Status: DC | PRN
  Administered 2011-07-15: 80 mg via ORAL
  Filled 2011-07-15 (×2): qty 1

## 2011-07-15 NOTE — Progress Notes (Signed)
Patient ID: RAHMIR SHREWSBURY, male   DOB: 11/25/1960, 51 y.o.   MRN: [id number] S:no c/o   O: BP 184/90  Pulse 84  Temp(Src) 97.9 F (36.6 C) (Oral)  Resp 20  Ht 5' 9.68" (1.77 m)  Wt 73.7 kg (162 lb 7.7 oz)  BMI 23.53 kg/m2  SpO2 100%  Gen:NAD CVS:RRR Resp:CTA [id number] Ext:no edema. avf +T/B      . amLODipine  5 mg Oral Daily  . calcium acetate  1,334 mg Oral TID WC  . carvedilol  25 mg Oral BID WC  . darbepoetin (ARANESP) injection - DIALYSIS  25 mcg Intravenous Q Mon-HD  . lidocaine-prilocaine   Topical Q M,W,F-HD  . multivitamin  1 tablet Oral Daily  . nicotine  14 mg Transdermal Q24H  . paricalcitol  4 mcg Intravenous 3 times weekly  . sodium chloride  3 mL Intravenous Q12H    BMET  Lab 07/14/11 1008 07/13/11 0558 07/11/11 1348 07/11/11 1117 07/09/11 2154  NA 137 136 136 -- 135  K 3.7 3.5 3.9 -- 3.2*  CL 104 102 104 -- 103  CO2 22 21 14* -- 16*  GLUCOSE 132* 165* 138* -- 167*  BUN 81* 82* 105* -- 106*  CREATININE 9.17* 8.94* 10.05* -- 10.19*  ALB -- -- -- -- --  CALCIUM 8.9 8.4 8.2* 8.1* 8.2*  PHOS 4.0 5.3* 5.0* -- --   CBC  Lab 07/14/11 1008 07/11/11 1349 07/09/11 2154  WBC 7.5 5.9 7.3  NEUTROABS -- -- 3.6  HGB 10.4* 10.6* 11.7*  HCT 29.5* 29.5* 32.6*  MCV 78.0 76.8* 77.1*  PLT 129* 169 144*    @IMGRELPRIORS @  Assessment/Plan: 1.itching- improved with sarna lotion and HD  2.ESRD cont MWF  3. Anemia:on epo  4. vasc access- AVF stable  5. Nutrition:stable  6. Hypertension:stable  7. Dispo- awaiting outpt HD.  Has medicaid and appreciate SW/CM assistance.  Hopefully will be able to be discharged tomorrow  [place] A

## 2011-07-15 NOTE — Progress Notes (Signed)
Noted pt for outpatient hemodialysis and registered as self pay . This CM notified financial counselor on 07/13/2010, who arranged an appointment with this pt today to start application. However they were able to learn that pt has started the Medicaid process prior to admission and a pending number is available. Number given to charge nurse and to [facility] in Hemodialysis unit who will begin the clip process. Medicaid pending [id number] S. Jasmine Pang RN MPH Case Manager  [phone]

## 2011-07-16 ENCOUNTER — Inpatient Hospital Stay (HOSPITAL_COMMUNITY): Payer: Medicaid Other

## 2011-07-16 LAB — CBC
HCT: 31.1 % — ABNORMAL LOW (ref 39.0–52.0)
Hemoglobin: 10.8 g/dL — ABNORMAL LOW (ref 13.0–17.0)
MCH: 27.2 pg (ref 26.0–34.0)
MCHC: 34.7 g/dL (ref 30.0–36.0)
MCV: 78.3 fL (ref 78.0–100.0)
Platelets: 90 10*3/uL — ABNORMAL LOW (ref 150–400)
RBC: 3.97 MIL/uL — ABNORMAL LOW (ref 4.22–5.81)
RDW: 15.8 % — ABNORMAL HIGH (ref 11.5–15.5)
WBC: 6.7 10*3/uL (ref 4.0–10.5)

## 2011-07-16 LAB — RENAL FUNCTION PANEL
Albumin: 2.6 g/dL — ABNORMAL LOW (ref 3.5–5.2)
BUN: 49 mg/dL — ABNORMAL HIGH (ref 6–23)
CO2: 26 mEq/L (ref 19–32)
Calcium: 9.5 mg/dL (ref 8.4–10.5)
Chloride: 96 mEq/L (ref 96–112)
Creatinine, Ser: 7.24 mg/dL — ABNORMAL HIGH (ref 0.50–1.35)
GFR calc Af Amer: 9 mL/min — ABNORMAL LOW (ref >90)
GFR calc non Af Amer: 8 mL/min — ABNORMAL LOW (ref >90)
Glucose, Bld: 114 mg/dL — ABNORMAL HIGH (ref 70–99)
Phosphorus: 5.5 mg/dL — ABNORMAL HIGH (ref 2.3–4.6)
Potassium: 4.4 mEq/L (ref 3.5–5.1)
Sodium: 134 mEq/L — ABNORMAL LOW (ref 135–145)

## 2011-07-16 LAB — URINE MICROSCOPIC-ADD ON

## 2011-07-16 LAB — URINALYSIS, ROUTINE W REFLEX MICROSCOPIC
Glucose, UA: 250 mg/dL — AB
Ketones, ur: NEGATIVE mg/dL
Nitrite: NEGATIVE
Protein, ur: >300 mg/dL — AB
Specific Gravity, Urine: 1.018 (ref 1.005–1.030)
Urobilinogen, UA: 1 mg/dL (ref 0.0–1.0)
pH: 7.5 (ref 5.0–8.0)

## 2011-07-16 MED ORDER — AMLODIPINE BESYLATE 5 MG PO TABS: 5.0000 mg | ORAL_TABLET | Freq: Every day | ORAL | Status: DC

## 2011-07-16 MED ORDER — PARICALCITOL 5 MCG/ML IV SOLN
INTRAVENOUS | Status: AC
  Administered 2011-07-16: 4 ug via INTRAVENOUS
  Filled 2011-07-16: qty 1

## 2011-07-16 MED ORDER — CALCIUM ACETATE 667 MG PO CAPS: 1334.0000 mg | ORAL_CAPSULE | Freq: Three times a day (TID) | ORAL | Status: AC

## 2011-07-16 MED ORDER — NEPRO/CARBSTEADY PO LIQD: 237.0000 mL | ORAL | Status: DC | PRN

## 2011-07-16 MED ORDER — PARICALCITOL 5 MCG/ML IV SOLN: 4.0000 ug | INTRAVENOUS | Status: DC

## 2011-07-16 MED ORDER — CAMPHOR-MENTHOL 0.5-0.5 % EX LOTN: 1.0000 "application " | TOPICAL_LOTION | Freq: Three times a day (TID) | CUTANEOUS | Status: AC | PRN

## 2011-07-16 MED ORDER — PENTAFLUOROPROP-TETRAFLUOROETH EX AERO: 1.0000 "application " | INHALATION_SPRAY | CUTANEOUS | Status: DC | PRN

## 2011-07-16 MED ORDER — DARBEPOETIN ALFA-POLYSORBATE 25 MCG/0.42ML IJ SOLN: 25.0000 ug | INTRAMUSCULAR | Status: DC

## 2011-07-16 MED ORDER — LIDOCAINE-PRILOCAINE 2.5-2.5 % EX CREA: TOPICAL_CREAM | CUTANEOUS | Status: AC

## 2011-07-16 MED ORDER — ONDANSETRON HCL 4 MG/2ML IJ SOLN
INTRAMUSCULAR | Status: AC
  Administered 2011-07-16: 4 mg via INTRAVENOUS
  Filled 2011-07-16: qty 2

## 2011-07-16 MED ORDER — RENA-VITE PO TABS: 1.0000 | ORAL_TABLET | Freq: Every day | ORAL | Status: DC

## 2011-07-16 MED ORDER — CARVEDILOL 25 MG PO TABS: 25.0000 mg | ORAL_TABLET | Freq: Two times a day (BID) | ORAL | Status: DC

## 2011-07-16 MED ORDER — NICOTINE 14 MG/24HR TD PT24: 14.0000 | MEDICATED_PATCH | TRANSDERMAL | Status: AC

## 2011-07-16 NOTE — Progress Notes (Signed)
Opened in error

## 2011-07-16 NOTE — Discharge Summary (Signed)
Physician Discharge Summary  Patient ID: [name] MRN: [id number] DOB/AGE: 1960/07/21 51 y.o.  Admit date: 07/11/2011 Discharge date: 07/16/2011  Discharge Diagnoses:  Active Problems:  Progressive CKD to ESRD with initiation of Hemodialysis    Discharged Condition: stable  Hospital Course: This is a 51 yr old AA male followed by Dr. Elmarie Shiley of [facility] due to progressive CKD secondary to HTN and underlying chronic Hepatitis B and C virus infections. He presenting to the office on 07/09/11 with uremic symptoms and was directly admitted for initiation of hemodialysis. He had a preexisting AVF that was successfully cannulated and underwent his first hemodialysis treatment on Sunday 07/11/11. He tolerated his third Hemodialysis on today and will be discharged to continue outpatient hemodialysis at the [facility] with his first treatment there scheduled for Monday 07/19/11. Management of Anemia was treated with IV Aranesp, Secondary Hyperparthyroidism with Phosphate binders and IV Zemplar, (vitamin D therapy), and his HTN was controlled with medications and UF during Hemodialysis. Due to his Hepatitis B status he was dialyzed in isolation and this will continue in the outpatient setting. His history of polysubstance abuse (including alcohol and cocaine) will be monitored in the outpatient setting as well. The ongoing use of Nicotine patches will be prescribed for his smoking cessation. Prior to discharge he developed dysuria and a urine specimen was sent for culture and sensitivity. No antibiotics will be prescribed until the results are available, which will be faxed to the outpatient HD unit.  Treatments: dialysis: Hemodialysis Blood pressure 175/77, pulse 83, temperature 97.9 F (36.6 C), temperature source Oral, resp. rate 20, height 5' 9.68" (1.77 m), weight 73.2 kg (161 lb 6 oz), SpO2 96.00%.  Disposition: Home or Self Care   Current Discharge  Medication List    START taking these medications   Details  amLODipine (NORVASC) 5 MG tablet Take 1 tablet (5 mg total) by mouth daily. Qty: 30 tablet, Refills: 6    calcium acetate (PHOSLO) 667 MG capsule Take 2 capsules (1,334 mg total) by mouth 3 (three) times daily with meals. Qty: 180 capsule, Refills: 6    camphor-menthol (SARNA) lotion Apply 1 application topically every 8 (eight) hours as needed for itching. Apply to skin as needed for itching Qty: 222 mL, Refills: 1    carvedilol (COREG) 25 MG tablet Take 1 tablet (25 mg total) by mouth 2 (two) times daily with a meal. Qty: 60 tablet, Refills: 6    darbepoetin (ARANESP) 25 MCG/0.42ML SOLN Inject 0.42 mLs (25 mcg total) into the vein every Monday with hemodialysis. Qty: 14.28 mL, Refills: 0    lidocaine-prilocaine (EMLA) cream Apply topically every Monday, Wednesday, and Friday with hemodialysis. Apply 30 min-1hr prior to each HD treatment and cover with occlusive dressing Qty: 30 g, Refills: 6    multivitamin (RENA-VIT) TABS tablet Take 1 tablet by mouth daily. Qty: 30 tablet, Refills: 11    nicotine (NICODERM CQ - DOSED IN MG/24 HOURS) 14 mg/24hr patch Place 14 patches onto the skin daily. Qty: 28 patch, Refills: 0    Nutritional Supplements (FEEDING SUPPLEMENT, NEPRO CARB STEADY,) LIQD Take 237 mLs by mouth as needed (missed meal during dialysis.). Qty: 60 Can, Refills: 6    paricalcitol (ZEMPLAR) 5 MCG/ML injection Inject 0.8 mLs (4 mcg total) into the vein 3 (three) times a week. Qty: 1 mL, Refills: 0    pentafluoroprop-tetrafluoroeth (GEBAUERS) AERO Apply 1 application topically as needed (topical anesthesia while in dialysis.). Qty: 1 mL,  Refills: 6      CONTINUE these medications which have NOT CHANGED   Details  hydrOXYzine (ATARAX/VISTARIL) 25 MG tablet Take 1 tablet (25 mg total) by mouth every 6 (six) hours. Qty: 12 tablet, Refills: 0      STOP taking these medications     calcium elemental as  carbonate (TUMS ULTRA 1000) 400 MG tablet      diphenhydrAMINE (BENADRYL) 25 MG tablet      isosorbide mononitrate (IMDUR) 60 MG 24 hr tablet         Signed: Marni Griffon, FNP-C [facility] Pager [phone] 07/16/2011, 1:46 PM  Attending Physician: Dr. Elmarie Shiley

## 2011-07-16 NOTE — Progress Notes (Signed)
Patient ID: [name], male   DOB: Dec 05, 1960, 51 y.o.   MRN: [id number] Pt seen on HD, goal UF 1L but developed hypotension and nausea.  Will decrease UF goal.  Pt set up for outpt HD at [facility] TTS and stable to resume HD on Tuesday.

## 2011-07-17 NOTE — ED Provider Notes (Signed)
History     CSN: [id number]  Arrival date & time 07/09/11  1505   First MD Initiated Contact with Patient 07/09/11 2149      Chief Complaint  Patient presents with  . Abdominal Pain     Here for first treatment of dialysis.       HPI: The history is provided by the patient.  Pt states he was told by Dr Posey Pronto at [facility] today that he would have to come to [facility] to be admitted for Dialysis since his Medicaid has not been approved. Pt is new dialysis pt but can not start dialysis at Dialysis Center due to lack of Medicaid coverage. Only c/o at this time is persistent itching.  Past Medical History  Diagnosis Date  . Chronic kidney disease   . Hypertension   . Hepatitis B, chronic   . Hepatitis C   . Anemia   . Metabolic bone disease   . Renal disorder   . Angina     DATE UNKNOWN, C/O PERIODICALLY  . GERD (gastroesophageal reflux disease)     DATE UNKNOWN  . Headache     OCCASIONALLY    Past Surgical History  Procedure Date  . Av fistula placement 2012    BELIEVED WAS PLACED IN JUNE    Family History  Problem Relation Age of Onset  . Heart disease Mother   . Heart disease Father   . Malignant hyperthermia Father   . Hypertension Other   . COPD Other     History  Substance Use Topics  . Smoking status: Current Everyday Smoker -- 1.0 packs/day for 37 years    Types: Cigarettes  . Smokeless tobacco: Never Used  . Alcohol Use: 2.3 oz/week    1 Glasses of wine, 1 Cans of beer, 1 Shots of liquor, 1 Drinks containing 0.5 oz of alcohol per week     ETOH USE IS OCCASIONAL      Review of Systems  Constitutional: Negative.   HENT: Negative.   Eyes: Negative.   Respiratory: Negative.   Cardiovascular: Negative.   Gastrointestinal: Negative.   Genitourinary: Negative.   Musculoskeletal: Negative.   Skin: Negative.   Neurological: Negative.   Hematological: Negative.   Psychiatric/Behavioral: Negative.     Allergies   Tramadol  Home Medications   Current Outpatient Rx  Name Route Sig Dispense Refill  . AMLODIPINE BESYLATE 5 MG PO TABS Oral Take 1 tablet (5 mg total) by mouth daily. 30 tablet 6    Take every night at bedtime for BP  . CALCIUM ACETATE 667 MG PO CAPS Oral Take 2 capsules (1,334 mg total) by mouth 3 (three) times daily with meals. 180 capsule 6  . CAMPHOR-MENTHOL 0.5-0.5 % EX LOTN Topical Apply 1 application topically every 8 (eight) hours as needed for itching. Apply to skin as needed for itching 222 mL 1  . CARVEDILOL 25 MG PO TABS Oral Take 1 tablet (25 mg total) by mouth 2 (two) times daily with a meal. 60 tablet 6    Hold dose prior to Hemodialysis treatments  . DARBEPOETIN ALFA-POLYSORBATE 25 MCG/0.42ML IJ SOLN Intravenous Inject 0.42 mLs (25 mcg total) into the vein every Monday with hemodialysis. 14.28 mL 0    This medication will be changed to Epogen in the o ...  . HYDROXYZINE HCL 25 MG PO TABS Oral Take 1 tablet (25 mg total) by mouth every 6 (six) hours. 12 tablet 0  . LIDOCAINE-PRILOCAINE 2.5-2.5 % EX  CREA Topical Apply topically every Monday, Wednesday, and Friday with hemodialysis. Apply 30 min-1hr prior to each HD treatment and cover with occlusive dressing 30 g 6  . RENA-VITE PO TABS Oral Take 1 tablet by mouth daily. 30 tablet 11  . NICOTINE 14 MG/24HR TD PT24 Transdermal Place 14 patches onto the skin daily. 28 patch 0    1 patch daily x 6 wks total the 24mcmg patch daily  ...  . NEPRO/CARB STEADY PO LIQD Oral Take 237 mLs by mouth as needed (missed meal during dialysis.). 60 Can 6    Twice daily as needed  . PARICALCITOL 5 MCG/ML IV SOLN Intravenous Inject 0.8 mLs (4 mcg total) into the vein 3 (three) times a week. 1 mL 0    This medication will be given during Hemodialysis  ...  . PENTAFLUOROPROP-TETRAFLUOROETH EX AERO Topical Apply 1 application topically as needed (topical anesthesia while in dialysis.). 1 mL 6    To be applied as needed prior to cannulations    BP  160/81  Pulse 67  Temp(Src) 97.8 F (36.6 C) (Oral)  Resp 16  Ht 5\' 11"  (1.803 m)  Wt 161 lb (73.029 kg)  BMI 22.45 kg/m2  SpO2 100%  Physical Exam  Constitutional: He appears well-developed and well-nourished.  HENT:  Head: Normocephalic and atraumatic.  Eyes: Conjunctivae are normal.  Neck: Neck supple.  Cardiovascular: Normal rate and regular rhythm.        LUE AVF w/ positive bruit and thrill  Pulmonary/Chest: Effort normal and breath sounds normal.  Abdominal: Soft. Bowel sounds are normal.  Musculoskeletal: Normal range of motion.  Neurological: He is alert.  Skin: Skin is warm and dry.  Psychiatric: He has a normal mood and affect.    ED Course  Procedures Pt noted eating fast food meal and conversing w/ family and visitors upon initial evaluation. Chest xray w/o acute findings. Lab results at baseline (or better) for pt. I have discussed pt w/ Dr Monia Pouch.  Will consult w/ pt's nephrologist for plan.  2355: I have spoken w/ Dr Florene Glen who admits he is aware of pt being instructed by Dr Posey Pronto to come for admission, however given pt's current clinical findings request pt be d/c'd home and instructed to f/u w/ Dr Posey Pronto on Monday. I have discussed this plan w/ pt and family who are agreeable. Will plan for d/c home w/ Rx for Hydroxyzine and encourage f/u w/ Dr Posey Pronto on Monday.  Labs Reviewed  CBC - Abnormal; Notable for the following:    Hemoglobin 11.7 (*)    HCT 32.6 (*)    MCV 77.1 (*)    RDW 16.2 (*)    Platelets 144 (*)    All other components within normal limits  BASIC METABOLIC PANEL - Abnormal; Notable for the following:    Potassium 3.2 (*)    CO2 16 (*)    Glucose, Bld 167 (*)    BUN 106 (*)    Creatinine, Ser 10.19 (*)    Calcium 8.2 (*)    GFR calc non Af Amer 5 (*)    GFR calc Af Amer 6 (*)    All other components within normal limits  DIFFERENTIAL  LAB REPORT - SCANNED      1. Itching       MDM  HPI/PE and clinical findings reflect pt's  ESRD Current clinical findings do not support an emergent need for dialysis.     Medical screening examination/treatment/procedure(s) were performed by non-physician  practitioner and as supervising physician I was immediately available for consultation/collaboration. Katy Apo, M.D.      Rhetta Mura Schorr, NP 07/17/11 [place], MD 07/18/11 2117

## 2011-07-21 ENCOUNTER — Encounter (HOSPITAL_COMMUNITY): Payer: Self-pay

## 2011-07-22 NOTE — Discharge Summary (Signed)
I have seen and examined this patient and agree with the assessment/plan as outlined above by Linna Hoff NP. Discharge as above. Ammaar Encina K.,MD 07/22/2011 1:24 PM

## 2011-07-28 ENCOUNTER — Encounter (HOSPITAL_COMMUNITY): Payer: Self-pay

## 2011-07-31 IMAGING — CR DG CHEST 2V
2 series · 2 of 2 positions shown · non-contrast
Comparison: 03/18/2009

CLINICAL DATA: Hypertension, dehydration, cramping, CHF, kidney
problems

CHEST - 2 VIEW

[w chest pa]
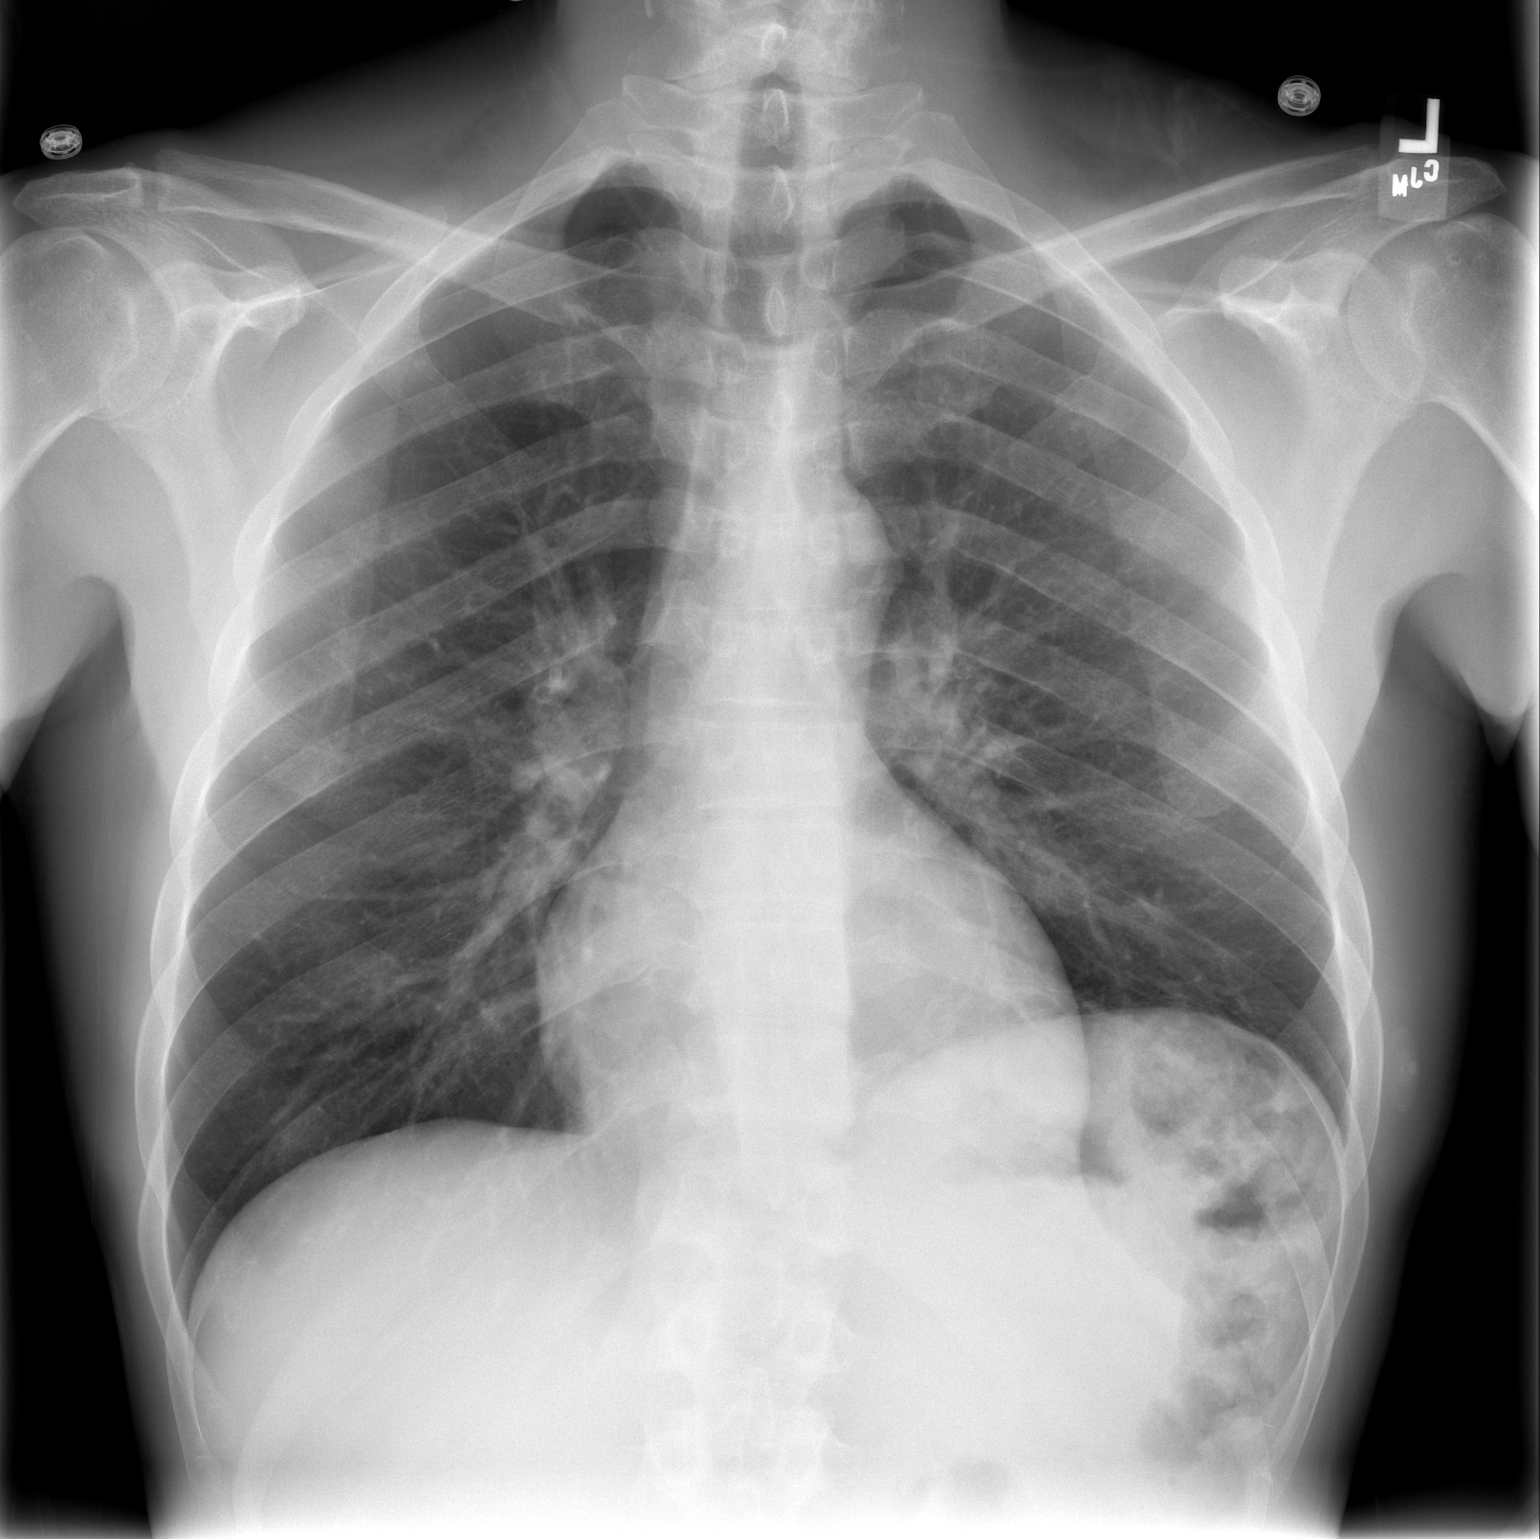

[w chest lat]
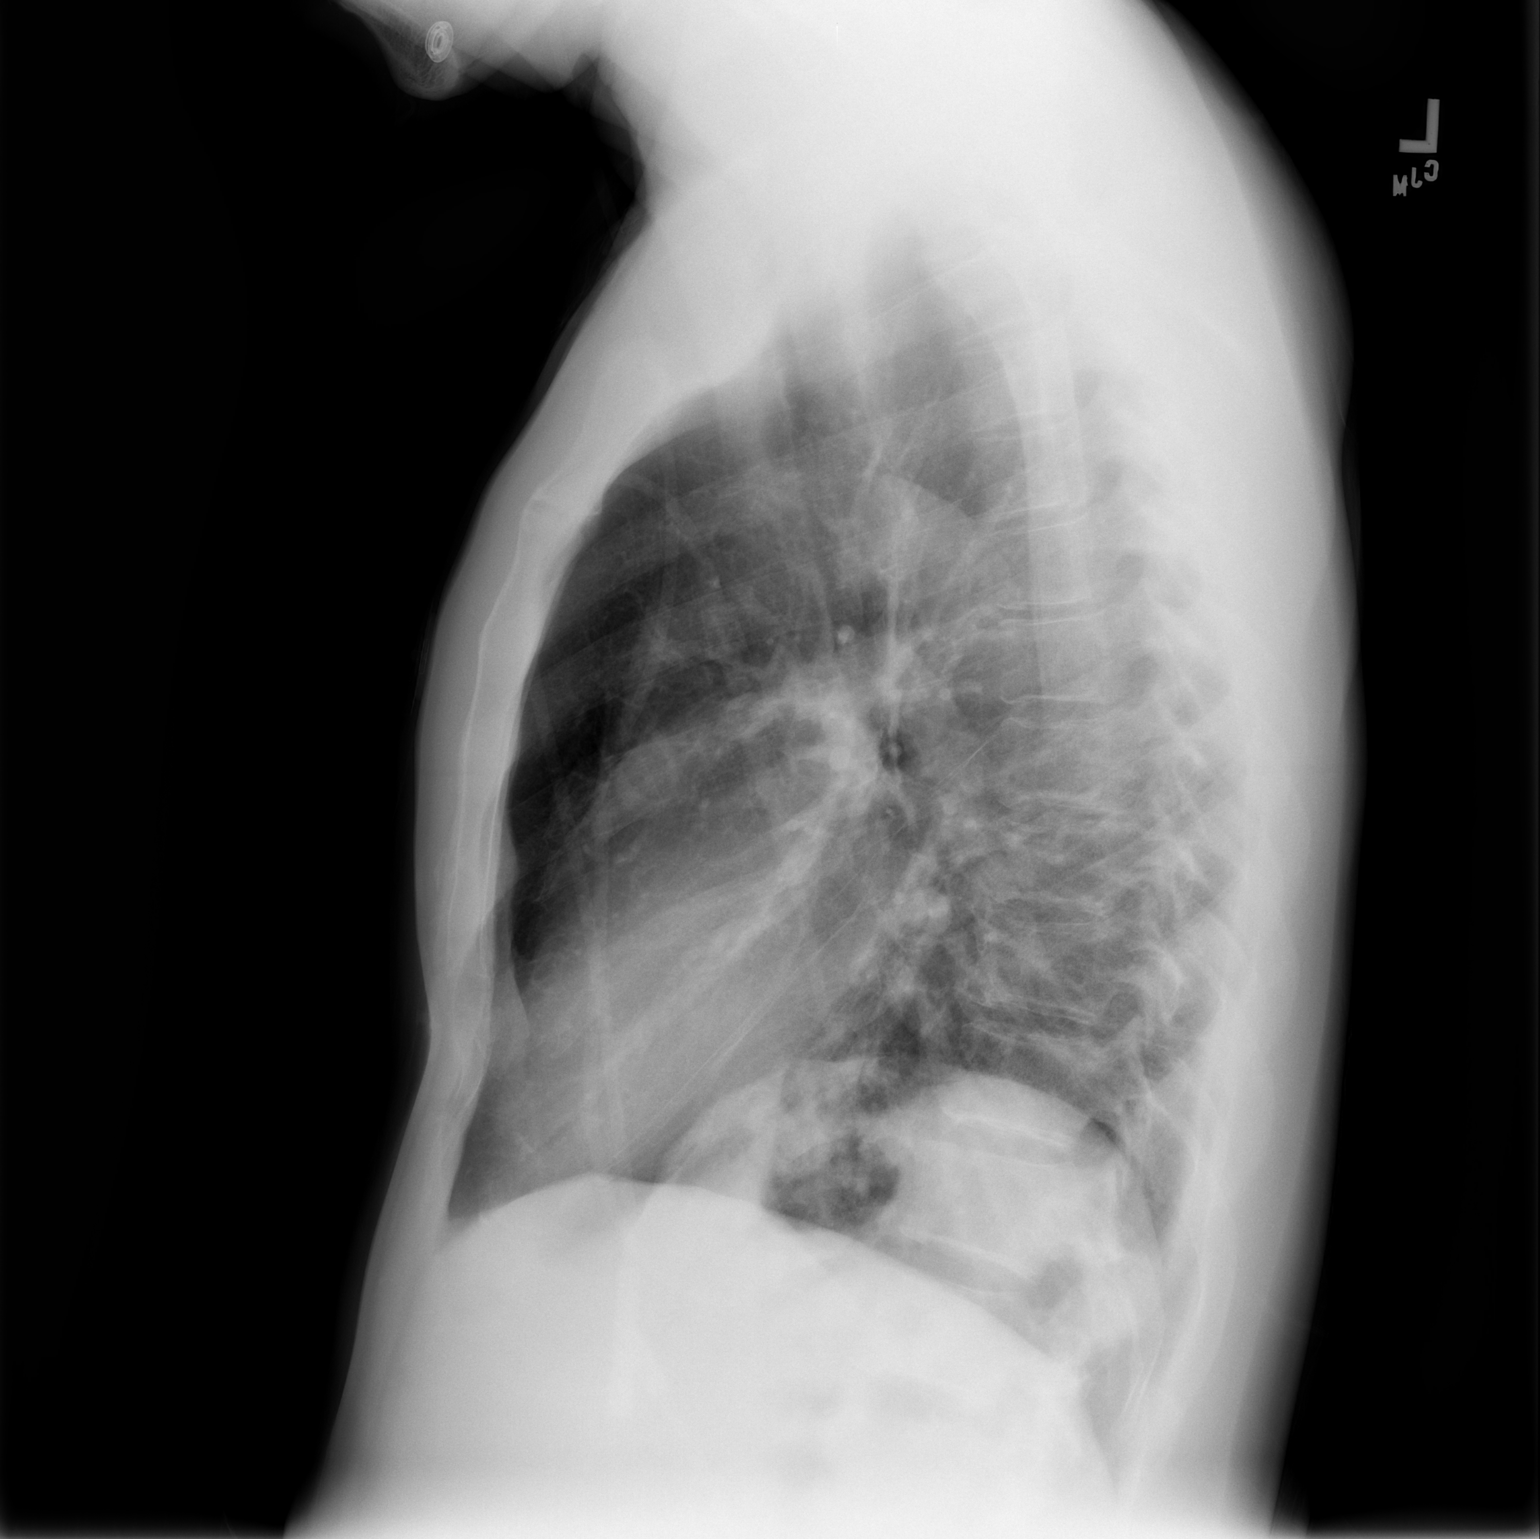

[2 of 2 positions shown; findings below may reference images not displayed]

FINDINGS: Normal heart size, mediastinal contours, and pulmonary vascularity.
Bronchitic changes without infiltrate or effusion.
Eventration left diaphragm again noted.
No pleural effusion or pneumothorax.
Bones unremarkable.
IMPRESSION: Mild chronic bronchitic changes.

## 2011-08-01 IMAGING — US US ABDOMEN COMPLETE
1 series · 13 of 25 positions shown · non-contrast
Comparison: No comparison studies available.

CLINICAL DATA: Acute renal failure.  Elevated LFTs.

ABDOMEN ULTRASOUND
TECHNIQUE: Complete abdominal ultrasound examination was performed
including evaluation of the liver, gallbladder, bile ducts,
pancreas, kidneys, spleen, IVC, and abdominal aorta.

[Series 1: us abdomen complete · 0.30mm/px · 13 of 48 slices shown]
[im 1/48]
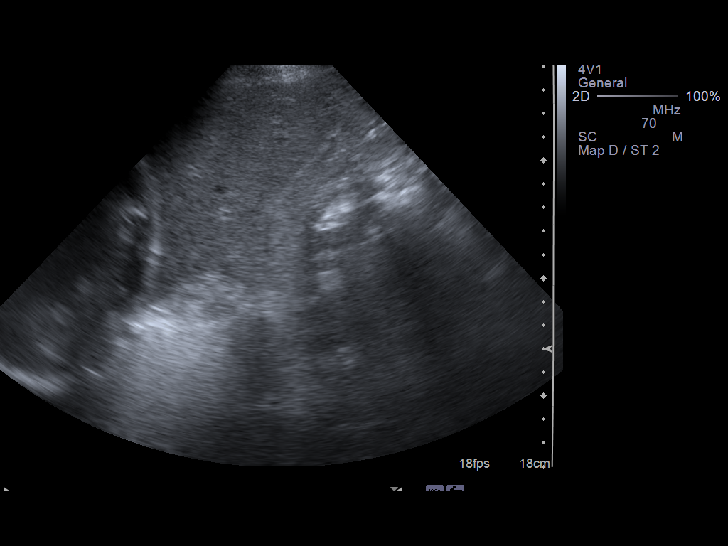
[im 4/48]
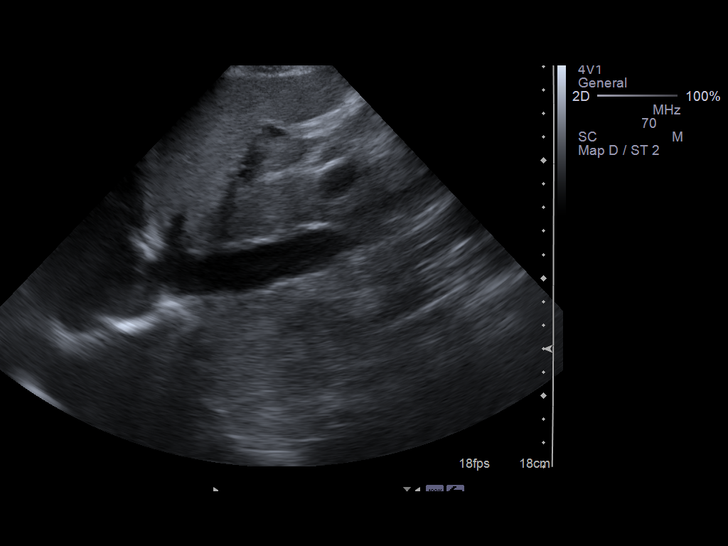
[im 8/48]
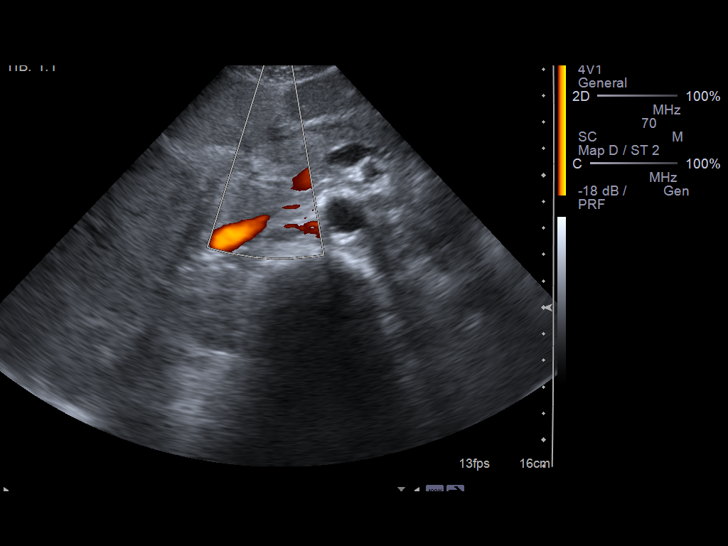
[im 12/48]
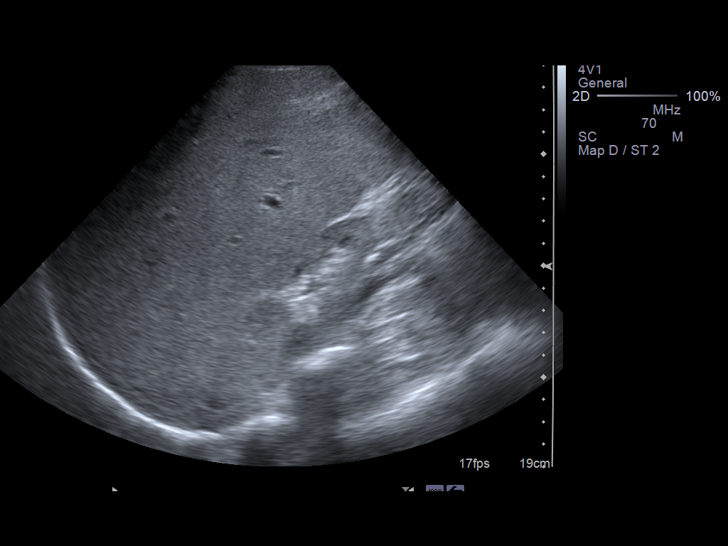
[im 16/48]
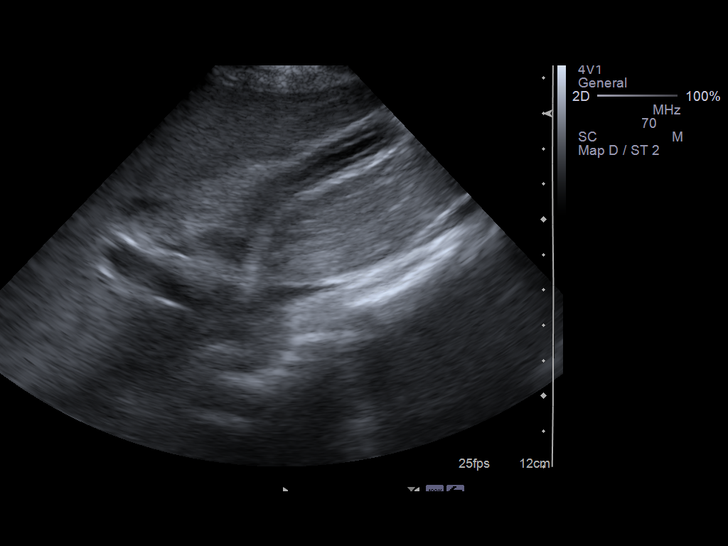
[im 20/48]
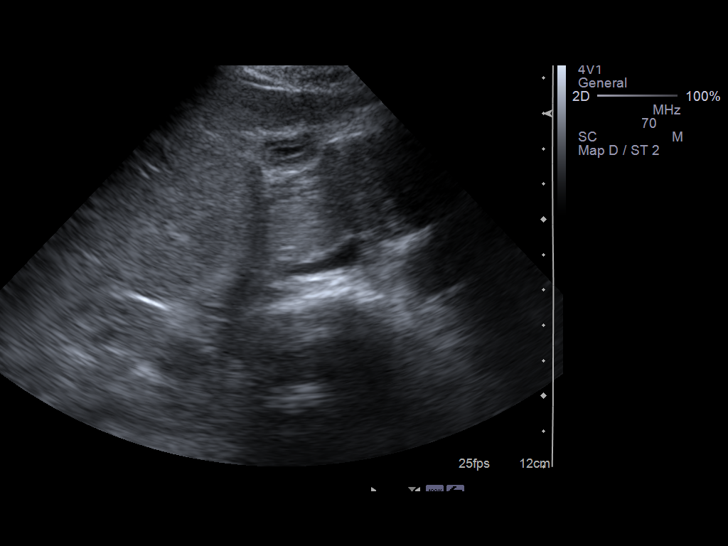
[im 24/48]
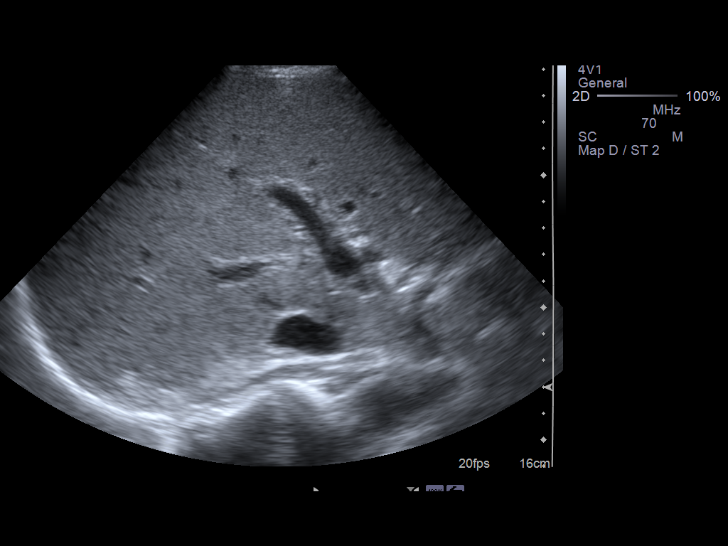
[im 28/48]
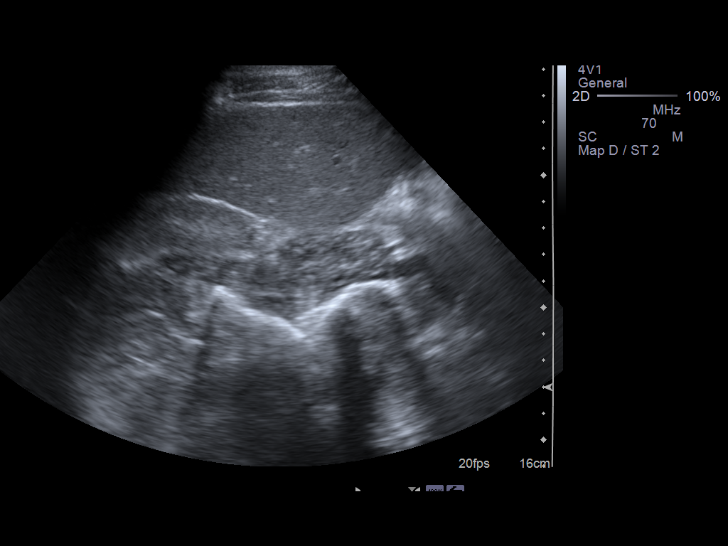
[im 32/48]
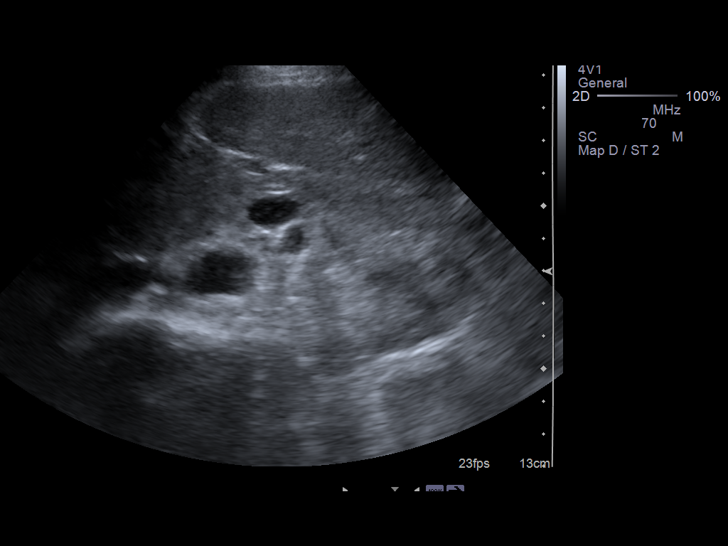
[im 36/48]
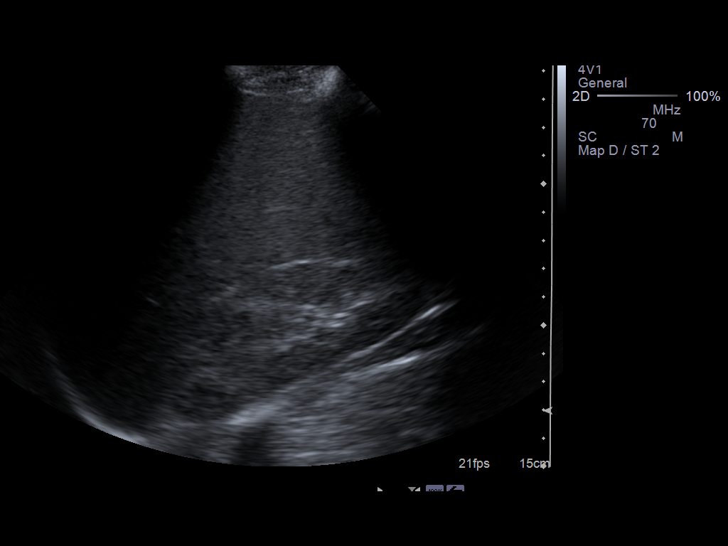
[im 40/48]
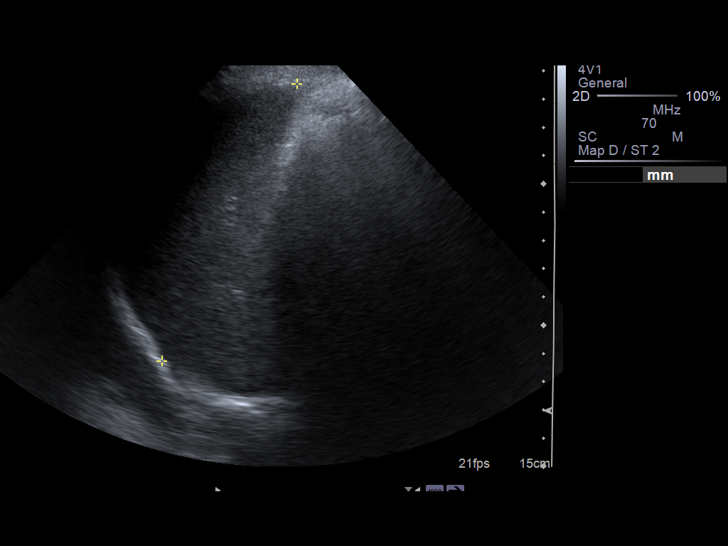
[im 44/48]
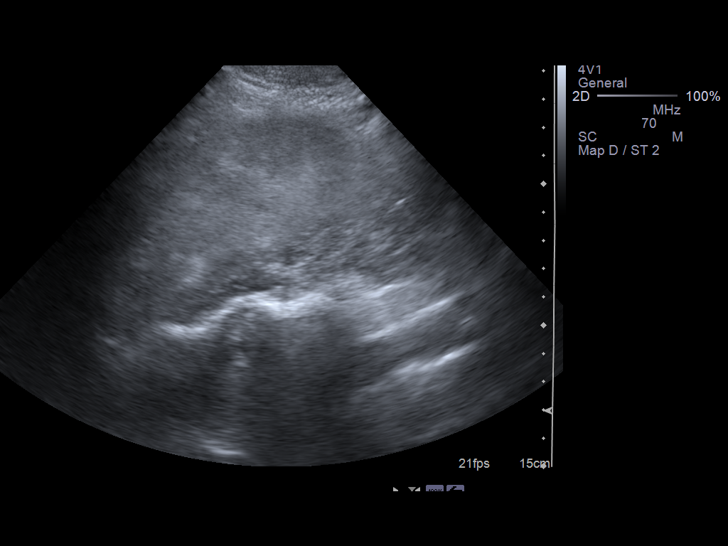
[im 48/48]
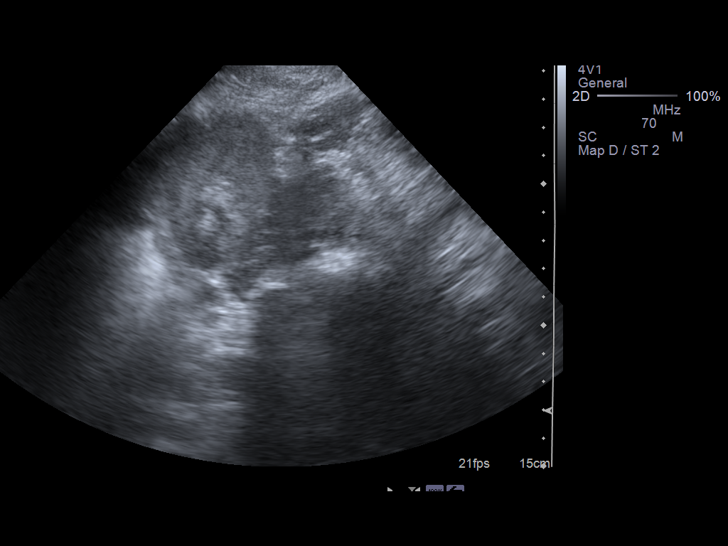

[13 of 25 positions shown; findings below may reference images not displayed]

FINDINGS: Gallbladder:  The gallbladder is not distended.  Gallbladder wall
is 6 mm in thickness.  No pericholecystic fluid.  The sonographer
reports no sonographic Murphy's sign.  Sensitivity for stone
detection is decreased by the nondistended state.

Common Bile Duct:  Nondilated at 3 mm diameter.

Liver:  Nearly 18 cm in cranial caudal length, enlarged.
Coarsening of the hepatic echotexture suggest fatty infiltration.

IVC:  Normal.

Pancreas:  Not well visualized secondary to overlying bowel gas.

Spleen:  Normal.

Right kidney:  11.4 cm in long axis.  Diffuse increase in renal
echogenicity.

Left kidney:  10.5 cm in long axis.  Diffuse increased in
echogenicity.  No hydronephrosis.

Abdominal Aorta:  No aneurysm.
IMPRESSION: Both kidneys show diffusely increased echogenicity, consistent with
medical renal disease.  No evidence for hydronephrosis.

Fatty, enlarged liver.

Nondistended gallbladder.

## 2011-08-04 ENCOUNTER — Encounter (HOSPITAL_COMMUNITY): Payer: Self-pay

## 2011-08-11 ENCOUNTER — Encounter (HOSPITAL_COMMUNITY): Payer: Self-pay

## 2011-09-10 DIAGNOSIS — D631 Anemia in chronic kidney disease: Secondary | ICD-10-CM | POA: Diagnosis not present

## 2011-09-10 DIAGNOSIS — Z992 Dependence on renal dialysis: Secondary | ICD-10-CM | POA: Diagnosis not present

## 2011-09-10 DIAGNOSIS — D509 Iron deficiency anemia, unspecified: Secondary | ICD-10-CM | POA: Diagnosis not present

## 2011-09-10 DIAGNOSIS — Z23 Encounter for immunization: Secondary | ICD-10-CM | POA: Diagnosis not present

## 2011-09-10 DIAGNOSIS — N2581 Secondary hyperparathyroidism of renal origin: Secondary | ICD-10-CM | POA: Diagnosis not present

## 2011-09-10 DIAGNOSIS — N186 End stage renal disease: Secondary | ICD-10-CM | POA: Diagnosis not present

## 2011-09-21 DIAGNOSIS — I10 Essential (primary) hypertension: Secondary | ICD-10-CM | POA: Diagnosis not present

## 2011-09-21 DIAGNOSIS — N186 End stage renal disease: Secondary | ICD-10-CM | POA: Diagnosis not present

## 2011-10-10 DIAGNOSIS — N186 End stage renal disease: Secondary | ICD-10-CM | POA: Diagnosis not present

## 2011-10-11 DIAGNOSIS — N186 End stage renal disease: Secondary | ICD-10-CM | POA: Diagnosis not present

## 2011-10-11 DIAGNOSIS — N2581 Secondary hyperparathyroidism of renal origin: Secondary | ICD-10-CM | POA: Diagnosis not present

## 2011-10-11 DIAGNOSIS — D631 Anemia in chronic kidney disease: Secondary | ICD-10-CM | POA: Diagnosis not present

## 2011-10-11 DIAGNOSIS — D509 Iron deficiency anemia, unspecified: Secondary | ICD-10-CM | POA: Diagnosis not present

## 2011-10-13 DIAGNOSIS — N2581 Secondary hyperparathyroidism of renal origin: Secondary | ICD-10-CM | POA: Diagnosis not present

## 2011-10-13 DIAGNOSIS — D631 Anemia in chronic kidney disease: Secondary | ICD-10-CM | POA: Diagnosis not present

## 2011-10-13 DIAGNOSIS — D509 Iron deficiency anemia, unspecified: Secondary | ICD-10-CM | POA: Diagnosis not present

## 2011-10-13 DIAGNOSIS — N186 End stage renal disease: Secondary | ICD-10-CM | POA: Diagnosis not present

## 2011-10-15 DIAGNOSIS — N186 End stage renal disease: Secondary | ICD-10-CM | POA: Diagnosis not present

## 2011-10-15 DIAGNOSIS — N2581 Secondary hyperparathyroidism of renal origin: Secondary | ICD-10-CM | POA: Diagnosis not present

## 2011-10-15 DIAGNOSIS — D631 Anemia in chronic kidney disease: Secondary | ICD-10-CM | POA: Diagnosis not present

## 2011-10-15 DIAGNOSIS — D509 Iron deficiency anemia, unspecified: Secondary | ICD-10-CM | POA: Diagnosis not present

## 2011-10-18 DIAGNOSIS — D631 Anemia in chronic kidney disease: Secondary | ICD-10-CM | POA: Diagnosis not present

## 2011-10-18 DIAGNOSIS — N186 End stage renal disease: Secondary | ICD-10-CM | POA: Diagnosis not present

## 2011-10-18 DIAGNOSIS — N2581 Secondary hyperparathyroidism of renal origin: Secondary | ICD-10-CM | POA: Diagnosis not present

## 2011-10-18 DIAGNOSIS — D509 Iron deficiency anemia, unspecified: Secondary | ICD-10-CM | POA: Diagnosis not present

## 2011-10-20 DIAGNOSIS — D509 Iron deficiency anemia, unspecified: Secondary | ICD-10-CM | POA: Diagnosis not present

## 2011-10-20 DIAGNOSIS — N2581 Secondary hyperparathyroidism of renal origin: Secondary | ICD-10-CM | POA: Diagnosis not present

## 2011-10-20 DIAGNOSIS — D631 Anemia in chronic kidney disease: Secondary | ICD-10-CM | POA: Diagnosis not present

## 2011-10-20 DIAGNOSIS — N186 End stage renal disease: Secondary | ICD-10-CM | POA: Diagnosis not present

## 2011-10-22 DIAGNOSIS — D509 Iron deficiency anemia, unspecified: Secondary | ICD-10-CM | POA: Diagnosis not present

## 2011-10-22 DIAGNOSIS — D631 Anemia in chronic kidney disease: Secondary | ICD-10-CM | POA: Diagnosis not present

## 2011-10-22 DIAGNOSIS — N2581 Secondary hyperparathyroidism of renal origin: Secondary | ICD-10-CM | POA: Diagnosis not present

## 2011-10-22 DIAGNOSIS — N186 End stage renal disease: Secondary | ICD-10-CM | POA: Diagnosis not present

## 2011-10-25 DIAGNOSIS — N186 End stage renal disease: Secondary | ICD-10-CM | POA: Diagnosis not present

## 2011-10-25 DIAGNOSIS — D509 Iron deficiency anemia, unspecified: Secondary | ICD-10-CM | POA: Diagnosis not present

## 2011-10-25 DIAGNOSIS — N2581 Secondary hyperparathyroidism of renal origin: Secondary | ICD-10-CM | POA: Diagnosis not present

## 2011-10-25 DIAGNOSIS — D631 Anemia in chronic kidney disease: Secondary | ICD-10-CM | POA: Diagnosis not present

## 2011-10-27 DIAGNOSIS — N186 End stage renal disease: Secondary | ICD-10-CM | POA: Diagnosis not present

## 2011-10-27 DIAGNOSIS — D509 Iron deficiency anemia, unspecified: Secondary | ICD-10-CM | POA: Diagnosis not present

## 2011-10-27 DIAGNOSIS — N2581 Secondary hyperparathyroidism of renal origin: Secondary | ICD-10-CM | POA: Diagnosis not present

## 2011-10-27 DIAGNOSIS — D631 Anemia in chronic kidney disease: Secondary | ICD-10-CM | POA: Diagnosis not present

## 2011-10-29 DIAGNOSIS — N186 End stage renal disease: Secondary | ICD-10-CM | POA: Diagnosis not present

## 2011-10-29 DIAGNOSIS — D631 Anemia in chronic kidney disease: Secondary | ICD-10-CM | POA: Diagnosis not present

## 2011-10-29 DIAGNOSIS — D509 Iron deficiency anemia, unspecified: Secondary | ICD-10-CM | POA: Diagnosis not present

## 2011-10-29 DIAGNOSIS — N2581 Secondary hyperparathyroidism of renal origin: Secondary | ICD-10-CM | POA: Diagnosis not present

## 2011-11-01 DIAGNOSIS — D509 Iron deficiency anemia, unspecified: Secondary | ICD-10-CM | POA: Diagnosis not present

## 2011-11-01 DIAGNOSIS — N2581 Secondary hyperparathyroidism of renal origin: Secondary | ICD-10-CM | POA: Diagnosis not present

## 2011-11-01 DIAGNOSIS — D631 Anemia in chronic kidney disease: Secondary | ICD-10-CM | POA: Diagnosis not present

## 2011-11-01 DIAGNOSIS — N186 End stage renal disease: Secondary | ICD-10-CM | POA: Diagnosis not present

## 2011-11-03 DIAGNOSIS — D631 Anemia in chronic kidney disease: Secondary | ICD-10-CM | POA: Diagnosis not present

## 2011-11-03 DIAGNOSIS — D509 Iron deficiency anemia, unspecified: Secondary | ICD-10-CM | POA: Diagnosis not present

## 2011-11-03 DIAGNOSIS — N186 End stage renal disease: Secondary | ICD-10-CM | POA: Diagnosis not present

## 2011-11-03 DIAGNOSIS — N2581 Secondary hyperparathyroidism of renal origin: Secondary | ICD-10-CM | POA: Diagnosis not present

## 2011-11-05 DIAGNOSIS — D631 Anemia in chronic kidney disease: Secondary | ICD-10-CM | POA: Diagnosis not present

## 2011-11-05 DIAGNOSIS — N186 End stage renal disease: Secondary | ICD-10-CM | POA: Diagnosis not present

## 2011-11-05 DIAGNOSIS — N2581 Secondary hyperparathyroidism of renal origin: Secondary | ICD-10-CM | POA: Diagnosis not present

## 2011-11-05 DIAGNOSIS — D509 Iron deficiency anemia, unspecified: Secondary | ICD-10-CM | POA: Diagnosis not present

## 2011-11-08 DIAGNOSIS — N2581 Secondary hyperparathyroidism of renal origin: Secondary | ICD-10-CM | POA: Diagnosis not present

## 2011-11-08 DIAGNOSIS — D509 Iron deficiency anemia, unspecified: Secondary | ICD-10-CM | POA: Diagnosis not present

## 2011-11-08 DIAGNOSIS — D631 Anemia in chronic kidney disease: Secondary | ICD-10-CM | POA: Diagnosis not present

## 2011-11-08 DIAGNOSIS — N186 End stage renal disease: Secondary | ICD-10-CM | POA: Diagnosis not present

## 2011-11-09 DIAGNOSIS — N186 End stage renal disease: Secondary | ICD-10-CM | POA: Diagnosis not present

## 2011-11-10 DIAGNOSIS — D631 Anemia in chronic kidney disease: Secondary | ICD-10-CM | POA: Diagnosis not present

## 2011-11-10 DIAGNOSIS — N186 End stage renal disease: Secondary | ICD-10-CM | POA: Diagnosis not present

## 2011-11-10 DIAGNOSIS — D509 Iron deficiency anemia, unspecified: Secondary | ICD-10-CM | POA: Diagnosis not present

## 2011-11-10 DIAGNOSIS — N2581 Secondary hyperparathyroidism of renal origin: Secondary | ICD-10-CM | POA: Diagnosis not present

## 2011-12-10 DIAGNOSIS — N186 End stage renal disease: Secondary | ICD-10-CM | POA: Diagnosis not present

## 2011-12-13 DIAGNOSIS — D631 Anemia in chronic kidney disease: Secondary | ICD-10-CM | POA: Diagnosis not present

## 2011-12-13 DIAGNOSIS — N186 End stage renal disease: Secondary | ICD-10-CM | POA: Diagnosis not present

## 2011-12-13 DIAGNOSIS — N2581 Secondary hyperparathyroidism of renal origin: Secondary | ICD-10-CM | POA: Diagnosis not present

## 2011-12-16 DIAGNOSIS — H3582 Retinal ischemia: Secondary | ICD-10-CM | POA: Diagnosis not present

## 2011-12-16 DIAGNOSIS — H35039 Hypertensive retinopathy, unspecified eye: Secondary | ICD-10-CM | POA: Diagnosis not present

## 2012-01-09 DIAGNOSIS — N186 End stage renal disease: Secondary | ICD-10-CM | POA: Diagnosis not present

## 2012-01-10 DIAGNOSIS — N2581 Secondary hyperparathyroidism of renal origin: Secondary | ICD-10-CM | POA: Diagnosis not present

## 2012-01-10 DIAGNOSIS — L299 Pruritus, unspecified: Secondary | ICD-10-CM | POA: Diagnosis not present

## 2012-01-10 DIAGNOSIS — N186 End stage renal disease: Secondary | ICD-10-CM | POA: Diagnosis not present

## 2012-01-10 DIAGNOSIS — D631 Anemia in chronic kidney disease: Secondary | ICD-10-CM | POA: Diagnosis not present

## 2012-01-10 DIAGNOSIS — D509 Iron deficiency anemia, unspecified: Secondary | ICD-10-CM | POA: Diagnosis not present

## 2012-01-10 DIAGNOSIS — I119 Hypertensive heart disease without heart failure: Secondary | ICD-10-CM | POA: Diagnosis not present

## 2012-01-10 DIAGNOSIS — I10 Essential (primary) hypertension: Secondary | ICD-10-CM | POA: Diagnosis not present

## 2012-02-02 DIAGNOSIS — R5381 Other malaise: Secondary | ICD-10-CM | POA: Diagnosis not present

## 2012-02-09 DIAGNOSIS — N186 End stage renal disease: Secondary | ICD-10-CM | POA: Diagnosis not present

## 2012-02-11 DIAGNOSIS — N186 End stage renal disease: Secondary | ICD-10-CM | POA: Diagnosis not present

## 2012-02-11 DIAGNOSIS — D631 Anemia in chronic kidney disease: Secondary | ICD-10-CM | POA: Diagnosis not present

## 2012-02-11 DIAGNOSIS — N2581 Secondary hyperparathyroidism of renal origin: Secondary | ICD-10-CM | POA: Diagnosis not present

## 2012-02-11 DIAGNOSIS — D509 Iron deficiency anemia, unspecified: Secondary | ICD-10-CM | POA: Diagnosis not present

## 2012-02-14 DIAGNOSIS — D631 Anemia in chronic kidney disease: Secondary | ICD-10-CM | POA: Diagnosis not present

## 2012-02-14 DIAGNOSIS — N186 End stage renal disease: Secondary | ICD-10-CM | POA: Diagnosis not present

## 2012-02-14 DIAGNOSIS — D509 Iron deficiency anemia, unspecified: Secondary | ICD-10-CM | POA: Diagnosis not present

## 2012-02-14 DIAGNOSIS — N2581 Secondary hyperparathyroidism of renal origin: Secondary | ICD-10-CM | POA: Diagnosis not present

## 2012-02-16 DIAGNOSIS — N186 End stage renal disease: Secondary | ICD-10-CM | POA: Diagnosis not present

## 2012-02-16 DIAGNOSIS — D509 Iron deficiency anemia, unspecified: Secondary | ICD-10-CM | POA: Diagnosis not present

## 2012-02-16 DIAGNOSIS — D631 Anemia in chronic kidney disease: Secondary | ICD-10-CM | POA: Diagnosis not present

## 2012-02-16 DIAGNOSIS — N2581 Secondary hyperparathyroidism of renal origin: Secondary | ICD-10-CM | POA: Diagnosis not present

## 2012-02-18 DIAGNOSIS — N186 End stage renal disease: Secondary | ICD-10-CM | POA: Diagnosis not present

## 2012-02-18 DIAGNOSIS — D509 Iron deficiency anemia, unspecified: Secondary | ICD-10-CM | POA: Diagnosis not present

## 2012-02-18 DIAGNOSIS — N2581 Secondary hyperparathyroidism of renal origin: Secondary | ICD-10-CM | POA: Diagnosis not present

## 2012-02-18 DIAGNOSIS — D631 Anemia in chronic kidney disease: Secondary | ICD-10-CM | POA: Diagnosis not present

## 2012-02-21 DIAGNOSIS — N2581 Secondary hyperparathyroidism of renal origin: Secondary | ICD-10-CM | POA: Diagnosis not present

## 2012-02-21 DIAGNOSIS — D631 Anemia in chronic kidney disease: Secondary | ICD-10-CM | POA: Diagnosis not present

## 2012-02-21 DIAGNOSIS — D509 Iron deficiency anemia, unspecified: Secondary | ICD-10-CM | POA: Diagnosis not present

## 2012-02-21 DIAGNOSIS — N186 End stage renal disease: Secondary | ICD-10-CM | POA: Diagnosis not present

## 2012-02-23 DIAGNOSIS — D509 Iron deficiency anemia, unspecified: Secondary | ICD-10-CM | POA: Diagnosis not present

## 2012-02-23 DIAGNOSIS — N2581 Secondary hyperparathyroidism of renal origin: Secondary | ICD-10-CM | POA: Diagnosis not present

## 2012-02-23 DIAGNOSIS — N186 End stage renal disease: Secondary | ICD-10-CM | POA: Diagnosis not present

## 2012-02-23 DIAGNOSIS — D631 Anemia in chronic kidney disease: Secondary | ICD-10-CM | POA: Diagnosis not present

## 2012-02-25 DIAGNOSIS — D631 Anemia in chronic kidney disease: Secondary | ICD-10-CM | POA: Diagnosis not present

## 2012-02-25 DIAGNOSIS — N186 End stage renal disease: Secondary | ICD-10-CM | POA: Diagnosis not present

## 2012-02-25 DIAGNOSIS — N2581 Secondary hyperparathyroidism of renal origin: Secondary | ICD-10-CM | POA: Diagnosis not present

## 2012-02-25 DIAGNOSIS — D509 Iron deficiency anemia, unspecified: Secondary | ICD-10-CM | POA: Diagnosis not present

## 2012-02-28 DIAGNOSIS — N529 Male erectile dysfunction, unspecified: Secondary | ICD-10-CM | POA: Diagnosis not present

## 2012-02-28 DIAGNOSIS — N2581 Secondary hyperparathyroidism of renal origin: Secondary | ICD-10-CM | POA: Diagnosis not present

## 2012-02-28 DIAGNOSIS — D509 Iron deficiency anemia, unspecified: Secondary | ICD-10-CM | POA: Diagnosis not present

## 2012-02-28 DIAGNOSIS — I10 Essential (primary) hypertension: Secondary | ICD-10-CM | POA: Diagnosis not present

## 2012-02-28 DIAGNOSIS — L299 Pruritus, unspecified: Secondary | ICD-10-CM | POA: Diagnosis not present

## 2012-02-28 DIAGNOSIS — N186 End stage renal disease: Secondary | ICD-10-CM | POA: Diagnosis not present

## 2012-02-28 DIAGNOSIS — D631 Anemia in chronic kidney disease: Secondary | ICD-10-CM | POA: Diagnosis not present

## 2012-03-01 DIAGNOSIS — D631 Anemia in chronic kidney disease: Secondary | ICD-10-CM | POA: Diagnosis not present

## 2012-03-01 DIAGNOSIS — N2581 Secondary hyperparathyroidism of renal origin: Secondary | ICD-10-CM | POA: Diagnosis not present

## 2012-03-01 DIAGNOSIS — N186 End stage renal disease: Secondary | ICD-10-CM | POA: Diagnosis not present

## 2012-03-01 DIAGNOSIS — D509 Iron deficiency anemia, unspecified: Secondary | ICD-10-CM | POA: Diagnosis not present

## 2012-03-03 DIAGNOSIS — D509 Iron deficiency anemia, unspecified: Secondary | ICD-10-CM | POA: Diagnosis not present

## 2012-03-03 DIAGNOSIS — D631 Anemia in chronic kidney disease: Secondary | ICD-10-CM | POA: Diagnosis not present

## 2012-03-03 DIAGNOSIS — N186 End stage renal disease: Secondary | ICD-10-CM | POA: Diagnosis not present

## 2012-03-03 DIAGNOSIS — N2581 Secondary hyperparathyroidism of renal origin: Secondary | ICD-10-CM | POA: Diagnosis not present

## 2012-03-06 DIAGNOSIS — D509 Iron deficiency anemia, unspecified: Secondary | ICD-10-CM | POA: Diagnosis not present

## 2012-03-06 DIAGNOSIS — D631 Anemia in chronic kidney disease: Secondary | ICD-10-CM | POA: Diagnosis not present

## 2012-03-06 DIAGNOSIS — N186 End stage renal disease: Secondary | ICD-10-CM | POA: Diagnosis not present

## 2012-03-06 DIAGNOSIS — N2581 Secondary hyperparathyroidism of renal origin: Secondary | ICD-10-CM | POA: Diagnosis not present

## 2012-03-08 DIAGNOSIS — N186 End stage renal disease: Secondary | ICD-10-CM | POA: Diagnosis not present

## 2012-03-08 DIAGNOSIS — D631 Anemia in chronic kidney disease: Secondary | ICD-10-CM | POA: Diagnosis not present

## 2012-03-08 DIAGNOSIS — N2581 Secondary hyperparathyroidism of renal origin: Secondary | ICD-10-CM | POA: Diagnosis not present

## 2012-03-08 DIAGNOSIS — D509 Iron deficiency anemia, unspecified: Secondary | ICD-10-CM | POA: Diagnosis not present

## 2012-03-10 DIAGNOSIS — N2581 Secondary hyperparathyroidism of renal origin: Secondary | ICD-10-CM | POA: Diagnosis not present

## 2012-03-10 DIAGNOSIS — D509 Iron deficiency anemia, unspecified: Secondary | ICD-10-CM | POA: Diagnosis not present

## 2012-03-10 DIAGNOSIS — D631 Anemia in chronic kidney disease: Secondary | ICD-10-CM | POA: Diagnosis not present

## 2012-03-10 DIAGNOSIS — N186 End stage renal disease: Secondary | ICD-10-CM | POA: Diagnosis not present

## 2012-03-11 DIAGNOSIS — N186 End stage renal disease: Secondary | ICD-10-CM | POA: Diagnosis not present

## 2012-03-13 DIAGNOSIS — N186 End stage renal disease: Secondary | ICD-10-CM | POA: Diagnosis not present

## 2012-03-13 DIAGNOSIS — N2581 Secondary hyperparathyroidism of renal origin: Secondary | ICD-10-CM | POA: Diagnosis not present

## 2012-03-13 DIAGNOSIS — D509 Iron deficiency anemia, unspecified: Secondary | ICD-10-CM | POA: Diagnosis not present

## 2012-03-13 DIAGNOSIS — D631 Anemia in chronic kidney disease: Secondary | ICD-10-CM | POA: Diagnosis not present

## 2012-04-10 DIAGNOSIS — N186 End stage renal disease: Secondary | ICD-10-CM | POA: Diagnosis not present

## 2012-04-12 DIAGNOSIS — D631 Anemia in chronic kidney disease: Secondary | ICD-10-CM | POA: Diagnosis not present

## 2012-04-12 DIAGNOSIS — N186 End stage renal disease: Secondary | ICD-10-CM | POA: Diagnosis not present

## 2012-04-12 DIAGNOSIS — N2581 Secondary hyperparathyroidism of renal origin: Secondary | ICD-10-CM | POA: Diagnosis not present

## 2012-04-12 DIAGNOSIS — Z23 Encounter for immunization: Secondary | ICD-10-CM | POA: Diagnosis not present

## 2012-04-12 DIAGNOSIS — D509 Iron deficiency anemia, unspecified: Secondary | ICD-10-CM | POA: Diagnosis not present

## 2012-04-14 DIAGNOSIS — N2581 Secondary hyperparathyroidism of renal origin: Secondary | ICD-10-CM | POA: Diagnosis not present

## 2012-04-14 DIAGNOSIS — N186 End stage renal disease: Secondary | ICD-10-CM | POA: Diagnosis not present

## 2012-04-14 DIAGNOSIS — D509 Iron deficiency anemia, unspecified: Secondary | ICD-10-CM | POA: Diagnosis not present

## 2012-04-14 DIAGNOSIS — D631 Anemia in chronic kidney disease: Secondary | ICD-10-CM | POA: Diagnosis not present

## 2012-04-14 DIAGNOSIS — Z23 Encounter for immunization: Secondary | ICD-10-CM | POA: Diagnosis not present

## 2012-04-17 DIAGNOSIS — Z23 Encounter for immunization: Secondary | ICD-10-CM | POA: Diagnosis not present

## 2012-04-17 DIAGNOSIS — N186 End stage renal disease: Secondary | ICD-10-CM | POA: Diagnosis not present

## 2012-04-17 DIAGNOSIS — D509 Iron deficiency anemia, unspecified: Secondary | ICD-10-CM | POA: Diagnosis not present

## 2012-04-17 DIAGNOSIS — D631 Anemia in chronic kidney disease: Secondary | ICD-10-CM | POA: Diagnosis not present

## 2012-04-17 DIAGNOSIS — N2581 Secondary hyperparathyroidism of renal origin: Secondary | ICD-10-CM | POA: Diagnosis not present

## 2012-04-19 DIAGNOSIS — N186 End stage renal disease: Secondary | ICD-10-CM | POA: Diagnosis not present

## 2012-04-19 DIAGNOSIS — Z23 Encounter for immunization: Secondary | ICD-10-CM | POA: Diagnosis not present

## 2012-04-19 DIAGNOSIS — N2581 Secondary hyperparathyroidism of renal origin: Secondary | ICD-10-CM | POA: Diagnosis not present

## 2012-04-19 DIAGNOSIS — D509 Iron deficiency anemia, unspecified: Secondary | ICD-10-CM | POA: Diagnosis not present

## 2012-04-19 DIAGNOSIS — D631 Anemia in chronic kidney disease: Secondary | ICD-10-CM | POA: Diagnosis not present

## 2012-04-21 DIAGNOSIS — D509 Iron deficiency anemia, unspecified: Secondary | ICD-10-CM | POA: Diagnosis not present

## 2012-04-21 DIAGNOSIS — N186 End stage renal disease: Secondary | ICD-10-CM | POA: Diagnosis not present

## 2012-04-21 DIAGNOSIS — Z23 Encounter for immunization: Secondary | ICD-10-CM | POA: Diagnosis not present

## 2012-04-21 DIAGNOSIS — N2581 Secondary hyperparathyroidism of renal origin: Secondary | ICD-10-CM | POA: Diagnosis not present

## 2012-04-21 DIAGNOSIS — D631 Anemia in chronic kidney disease: Secondary | ICD-10-CM | POA: Diagnosis not present

## 2012-04-24 DIAGNOSIS — N2581 Secondary hyperparathyroidism of renal origin: Secondary | ICD-10-CM | POA: Diagnosis not present

## 2012-04-24 DIAGNOSIS — D509 Iron deficiency anemia, unspecified: Secondary | ICD-10-CM | POA: Diagnosis not present

## 2012-04-24 DIAGNOSIS — N186 End stage renal disease: Secondary | ICD-10-CM | POA: Diagnosis not present

## 2012-04-24 DIAGNOSIS — Z23 Encounter for immunization: Secondary | ICD-10-CM | POA: Diagnosis not present

## 2012-04-24 DIAGNOSIS — D631 Anemia in chronic kidney disease: Secondary | ICD-10-CM | POA: Diagnosis not present

## 2012-04-26 DIAGNOSIS — D631 Anemia in chronic kidney disease: Secondary | ICD-10-CM | POA: Diagnosis not present

## 2012-04-26 DIAGNOSIS — Z23 Encounter for immunization: Secondary | ICD-10-CM | POA: Diagnosis not present

## 2012-04-26 DIAGNOSIS — N2581 Secondary hyperparathyroidism of renal origin: Secondary | ICD-10-CM | POA: Diagnosis not present

## 2012-04-26 DIAGNOSIS — N186 End stage renal disease: Secondary | ICD-10-CM | POA: Diagnosis not present

## 2012-04-26 DIAGNOSIS — D509 Iron deficiency anemia, unspecified: Secondary | ICD-10-CM | POA: Diagnosis not present

## 2012-04-28 DIAGNOSIS — N2581 Secondary hyperparathyroidism of renal origin: Secondary | ICD-10-CM | POA: Diagnosis not present

## 2012-04-28 DIAGNOSIS — N186 End stage renal disease: Secondary | ICD-10-CM | POA: Diagnosis not present

## 2012-04-28 DIAGNOSIS — D509 Iron deficiency anemia, unspecified: Secondary | ICD-10-CM | POA: Diagnosis not present

## 2012-04-28 DIAGNOSIS — Z23 Encounter for immunization: Secondary | ICD-10-CM | POA: Diagnosis not present

## 2012-04-28 DIAGNOSIS — D631 Anemia in chronic kidney disease: Secondary | ICD-10-CM | POA: Diagnosis not present

## 2012-05-01 DIAGNOSIS — D631 Anemia in chronic kidney disease: Secondary | ICD-10-CM | POA: Diagnosis not present

## 2012-05-01 DIAGNOSIS — N186 End stage renal disease: Secondary | ICD-10-CM | POA: Diagnosis not present

## 2012-05-01 DIAGNOSIS — N2581 Secondary hyperparathyroidism of renal origin: Secondary | ICD-10-CM | POA: Diagnosis not present

## 2012-05-01 DIAGNOSIS — D509 Iron deficiency anemia, unspecified: Secondary | ICD-10-CM | POA: Diagnosis not present

## 2012-05-01 DIAGNOSIS — Z23 Encounter for immunization: Secondary | ICD-10-CM | POA: Diagnosis not present

## 2012-05-03 DIAGNOSIS — D631 Anemia in chronic kidney disease: Secondary | ICD-10-CM | POA: Diagnosis not present

## 2012-05-03 DIAGNOSIS — N2581 Secondary hyperparathyroidism of renal origin: Secondary | ICD-10-CM | POA: Diagnosis not present

## 2012-05-03 DIAGNOSIS — N186 End stage renal disease: Secondary | ICD-10-CM | POA: Diagnosis not present

## 2012-05-03 DIAGNOSIS — D509 Iron deficiency anemia, unspecified: Secondary | ICD-10-CM | POA: Diagnosis not present

## 2012-05-03 DIAGNOSIS — Z23 Encounter for immunization: Secondary | ICD-10-CM | POA: Diagnosis not present

## 2012-05-05 DIAGNOSIS — Z23 Encounter for immunization: Secondary | ICD-10-CM | POA: Diagnosis not present

## 2012-05-05 DIAGNOSIS — D631 Anemia in chronic kidney disease: Secondary | ICD-10-CM | POA: Diagnosis not present

## 2012-05-05 DIAGNOSIS — N186 End stage renal disease: Secondary | ICD-10-CM | POA: Diagnosis not present

## 2012-05-05 DIAGNOSIS — D509 Iron deficiency anemia, unspecified: Secondary | ICD-10-CM | POA: Diagnosis not present

## 2012-05-05 DIAGNOSIS — N2581 Secondary hyperparathyroidism of renal origin: Secondary | ICD-10-CM | POA: Diagnosis not present

## 2012-05-08 DIAGNOSIS — Z23 Encounter for immunization: Secondary | ICD-10-CM | POA: Diagnosis not present

## 2012-05-08 DIAGNOSIS — N186 End stage renal disease: Secondary | ICD-10-CM | POA: Diagnosis not present

## 2012-05-08 DIAGNOSIS — N2581 Secondary hyperparathyroidism of renal origin: Secondary | ICD-10-CM | POA: Diagnosis not present

## 2012-05-08 DIAGNOSIS — D509 Iron deficiency anemia, unspecified: Secondary | ICD-10-CM | POA: Diagnosis not present

## 2012-05-08 DIAGNOSIS — D631 Anemia in chronic kidney disease: Secondary | ICD-10-CM | POA: Diagnosis not present

## 2012-05-10 DIAGNOSIS — D509 Iron deficiency anemia, unspecified: Secondary | ICD-10-CM | POA: Diagnosis not present

## 2012-05-10 DIAGNOSIS — N2581 Secondary hyperparathyroidism of renal origin: Secondary | ICD-10-CM | POA: Diagnosis not present

## 2012-05-10 DIAGNOSIS — D631 Anemia in chronic kidney disease: Secondary | ICD-10-CM | POA: Diagnosis not present

## 2012-05-10 DIAGNOSIS — Z23 Encounter for immunization: Secondary | ICD-10-CM | POA: Diagnosis not present

## 2012-05-10 DIAGNOSIS — N186 End stage renal disease: Secondary | ICD-10-CM | POA: Diagnosis not present

## 2012-05-11 DIAGNOSIS — N186 End stage renal disease: Secondary | ICD-10-CM | POA: Diagnosis not present

## 2012-05-12 DIAGNOSIS — D509 Iron deficiency anemia, unspecified: Secondary | ICD-10-CM | POA: Diagnosis not present

## 2012-05-12 DIAGNOSIS — N186 End stage renal disease: Secondary | ICD-10-CM | POA: Diagnosis not present

## 2012-05-12 DIAGNOSIS — D631 Anemia in chronic kidney disease: Secondary | ICD-10-CM | POA: Diagnosis not present

## 2012-05-12 DIAGNOSIS — N2581 Secondary hyperparathyroidism of renal origin: Secondary | ICD-10-CM | POA: Diagnosis not present

## 2012-05-15 DIAGNOSIS — D509 Iron deficiency anemia, unspecified: Secondary | ICD-10-CM | POA: Diagnosis not present

## 2012-05-15 DIAGNOSIS — N186 End stage renal disease: Secondary | ICD-10-CM | POA: Diagnosis not present

## 2012-05-15 DIAGNOSIS — N2581 Secondary hyperparathyroidism of renal origin: Secondary | ICD-10-CM | POA: Diagnosis not present

## 2012-05-15 DIAGNOSIS — D631 Anemia in chronic kidney disease: Secondary | ICD-10-CM | POA: Diagnosis not present

## 2012-05-17 DIAGNOSIS — N186 End stage renal disease: Secondary | ICD-10-CM | POA: Diagnosis not present

## 2012-05-17 DIAGNOSIS — D631 Anemia in chronic kidney disease: Secondary | ICD-10-CM | POA: Diagnosis not present

## 2012-05-17 DIAGNOSIS — D509 Iron deficiency anemia, unspecified: Secondary | ICD-10-CM | POA: Diagnosis not present

## 2012-05-17 DIAGNOSIS — N2581 Secondary hyperparathyroidism of renal origin: Secondary | ICD-10-CM | POA: Diagnosis not present

## 2012-05-19 DIAGNOSIS — N2581 Secondary hyperparathyroidism of renal origin: Secondary | ICD-10-CM | POA: Diagnosis not present

## 2012-05-19 DIAGNOSIS — N186 End stage renal disease: Secondary | ICD-10-CM | POA: Diagnosis not present

## 2012-05-19 DIAGNOSIS — D509 Iron deficiency anemia, unspecified: Secondary | ICD-10-CM | POA: Diagnosis not present

## 2012-05-19 DIAGNOSIS — D631 Anemia in chronic kidney disease: Secondary | ICD-10-CM | POA: Diagnosis not present

## 2012-05-22 DIAGNOSIS — D509 Iron deficiency anemia, unspecified: Secondary | ICD-10-CM | POA: Diagnosis not present

## 2012-05-22 DIAGNOSIS — N2581 Secondary hyperparathyroidism of renal origin: Secondary | ICD-10-CM | POA: Diagnosis not present

## 2012-05-22 DIAGNOSIS — D631 Anemia in chronic kidney disease: Secondary | ICD-10-CM | POA: Diagnosis not present

## 2012-05-22 DIAGNOSIS — N186 End stage renal disease: Secondary | ICD-10-CM | POA: Diagnosis not present

## 2012-05-24 DIAGNOSIS — N2581 Secondary hyperparathyroidism of renal origin: Secondary | ICD-10-CM | POA: Diagnosis not present

## 2012-05-24 DIAGNOSIS — D631 Anemia in chronic kidney disease: Secondary | ICD-10-CM | POA: Diagnosis not present

## 2012-05-24 DIAGNOSIS — D509 Iron deficiency anemia, unspecified: Secondary | ICD-10-CM | POA: Diagnosis not present

## 2012-05-24 DIAGNOSIS — N186 End stage renal disease: Secondary | ICD-10-CM | POA: Diagnosis not present

## 2012-05-26 DIAGNOSIS — N2581 Secondary hyperparathyroidism of renal origin: Secondary | ICD-10-CM | POA: Diagnosis not present

## 2012-05-26 DIAGNOSIS — D631 Anemia in chronic kidney disease: Secondary | ICD-10-CM | POA: Diagnosis not present

## 2012-05-26 DIAGNOSIS — N186 End stage renal disease: Secondary | ICD-10-CM | POA: Diagnosis not present

## 2012-05-26 DIAGNOSIS — D509 Iron deficiency anemia, unspecified: Secondary | ICD-10-CM | POA: Diagnosis not present

## 2012-05-29 DIAGNOSIS — D509 Iron deficiency anemia, unspecified: Secondary | ICD-10-CM | POA: Diagnosis not present

## 2012-05-29 DIAGNOSIS — D631 Anemia in chronic kidney disease: Secondary | ICD-10-CM | POA: Diagnosis not present

## 2012-05-29 DIAGNOSIS — N186 End stage renal disease: Secondary | ICD-10-CM | POA: Diagnosis not present

## 2012-05-29 DIAGNOSIS — N2581 Secondary hyperparathyroidism of renal origin: Secondary | ICD-10-CM | POA: Diagnosis not present

## 2012-05-31 DIAGNOSIS — D509 Iron deficiency anemia, unspecified: Secondary | ICD-10-CM | POA: Diagnosis not present

## 2012-05-31 DIAGNOSIS — N2581 Secondary hyperparathyroidism of renal origin: Secondary | ICD-10-CM | POA: Diagnosis not present

## 2012-05-31 DIAGNOSIS — D631 Anemia in chronic kidney disease: Secondary | ICD-10-CM | POA: Diagnosis not present

## 2012-05-31 DIAGNOSIS — N186 End stage renal disease: Secondary | ICD-10-CM | POA: Diagnosis not present

## 2012-06-02 DIAGNOSIS — D631 Anemia in chronic kidney disease: Secondary | ICD-10-CM | POA: Diagnosis not present

## 2012-06-02 DIAGNOSIS — D509 Iron deficiency anemia, unspecified: Secondary | ICD-10-CM | POA: Diagnosis not present

## 2012-06-02 DIAGNOSIS — N2581 Secondary hyperparathyroidism of renal origin: Secondary | ICD-10-CM | POA: Diagnosis not present

## 2012-06-02 DIAGNOSIS — N186 End stage renal disease: Secondary | ICD-10-CM | POA: Diagnosis not present

## 2012-06-05 DIAGNOSIS — D631 Anemia in chronic kidney disease: Secondary | ICD-10-CM | POA: Diagnosis not present

## 2012-06-05 DIAGNOSIS — N2581 Secondary hyperparathyroidism of renal origin: Secondary | ICD-10-CM | POA: Diagnosis not present

## 2012-06-05 DIAGNOSIS — D509 Iron deficiency anemia, unspecified: Secondary | ICD-10-CM | POA: Diagnosis not present

## 2012-06-05 DIAGNOSIS — N186 End stage renal disease: Secondary | ICD-10-CM | POA: Diagnosis not present

## 2012-06-07 DIAGNOSIS — N186 End stage renal disease: Secondary | ICD-10-CM | POA: Diagnosis not present

## 2012-06-07 DIAGNOSIS — D509 Iron deficiency anemia, unspecified: Secondary | ICD-10-CM | POA: Diagnosis not present

## 2012-06-07 DIAGNOSIS — N2581 Secondary hyperparathyroidism of renal origin: Secondary | ICD-10-CM | POA: Diagnosis not present

## 2012-06-07 DIAGNOSIS — D631 Anemia in chronic kidney disease: Secondary | ICD-10-CM | POA: Diagnosis not present

## 2012-06-09 DIAGNOSIS — N2581 Secondary hyperparathyroidism of renal origin: Secondary | ICD-10-CM | POA: Diagnosis not present

## 2012-06-09 DIAGNOSIS — N186 End stage renal disease: Secondary | ICD-10-CM | POA: Diagnosis not present

## 2012-06-09 DIAGNOSIS — D509 Iron deficiency anemia, unspecified: Secondary | ICD-10-CM | POA: Diagnosis not present

## 2012-06-09 DIAGNOSIS — D631 Anemia in chronic kidney disease: Secondary | ICD-10-CM | POA: Diagnosis not present

## 2012-06-10 DIAGNOSIS — N186 End stage renal disease: Secondary | ICD-10-CM | POA: Diagnosis not present

## 2012-06-12 DIAGNOSIS — D631 Anemia in chronic kidney disease: Secondary | ICD-10-CM | POA: Diagnosis not present

## 2012-06-12 DIAGNOSIS — D509 Iron deficiency anemia, unspecified: Secondary | ICD-10-CM | POA: Diagnosis not present

## 2012-06-12 DIAGNOSIS — N186 End stage renal disease: Secondary | ICD-10-CM | POA: Diagnosis not present

## 2012-06-12 DIAGNOSIS — N2581 Secondary hyperparathyroidism of renal origin: Secondary | ICD-10-CM | POA: Diagnosis not present

## 2012-06-14 DIAGNOSIS — D631 Anemia in chronic kidney disease: Secondary | ICD-10-CM | POA: Diagnosis not present

## 2012-06-14 DIAGNOSIS — N2581 Secondary hyperparathyroidism of renal origin: Secondary | ICD-10-CM | POA: Diagnosis not present

## 2012-06-14 DIAGNOSIS — N186 End stage renal disease: Secondary | ICD-10-CM | POA: Diagnosis not present

## 2012-06-14 DIAGNOSIS — D509 Iron deficiency anemia, unspecified: Secondary | ICD-10-CM | POA: Diagnosis not present

## 2012-06-16 DIAGNOSIS — D631 Anemia in chronic kidney disease: Secondary | ICD-10-CM | POA: Diagnosis not present

## 2012-06-16 DIAGNOSIS — N2581 Secondary hyperparathyroidism of renal origin: Secondary | ICD-10-CM | POA: Diagnosis not present

## 2012-06-16 DIAGNOSIS — N186 End stage renal disease: Secondary | ICD-10-CM | POA: Diagnosis not present

## 2012-06-16 DIAGNOSIS — D509 Iron deficiency anemia, unspecified: Secondary | ICD-10-CM | POA: Diagnosis not present

## 2012-06-19 DIAGNOSIS — N2581 Secondary hyperparathyroidism of renal origin: Secondary | ICD-10-CM | POA: Diagnosis not present

## 2012-06-19 DIAGNOSIS — D631 Anemia in chronic kidney disease: Secondary | ICD-10-CM | POA: Diagnosis not present

## 2012-06-19 DIAGNOSIS — N186 End stage renal disease: Secondary | ICD-10-CM | POA: Diagnosis not present

## 2012-06-19 DIAGNOSIS — D509 Iron deficiency anemia, unspecified: Secondary | ICD-10-CM | POA: Diagnosis not present

## 2012-06-21 DIAGNOSIS — N2581 Secondary hyperparathyroidism of renal origin: Secondary | ICD-10-CM | POA: Diagnosis not present

## 2012-06-21 DIAGNOSIS — D509 Iron deficiency anemia, unspecified: Secondary | ICD-10-CM | POA: Diagnosis not present

## 2012-06-21 DIAGNOSIS — D631 Anemia in chronic kidney disease: Secondary | ICD-10-CM | POA: Diagnosis not present

## 2012-06-21 DIAGNOSIS — N186 End stage renal disease: Secondary | ICD-10-CM | POA: Diagnosis not present

## 2012-06-22 IMAGING — CT CT HEAD W/O CM
1 of 2 series · 13 of 30 positions shown, 17 images · non-contrast
Comparison: None.

CLINICAL DATA: Visual problems.  Headache.

CT HEAD WITHOUT CONTRAST
TECHNIQUE: Contiguous axial images were obtained from the base of
the skull through the vertex without contrast.

[Series 2: brain · axial · 0.47mm/px · z∈[+159,+292]mm · 13 of 32 slices shown, 17 images]
[im 3/32  brain]
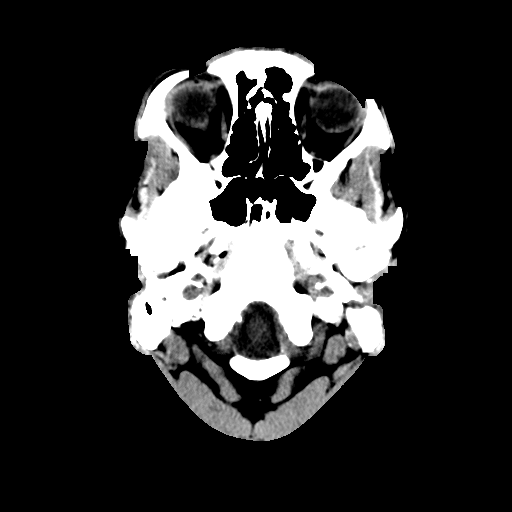
[im 3/32  bone]
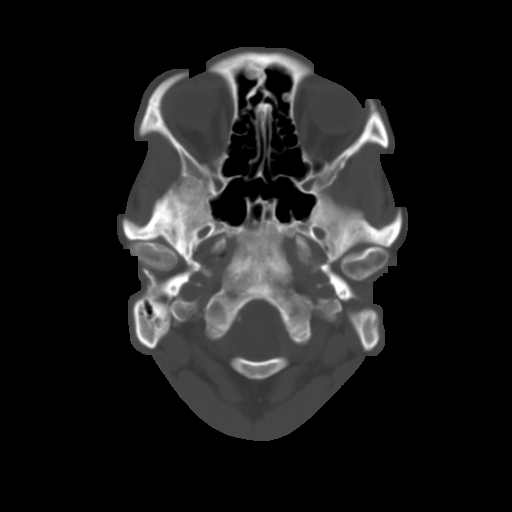
[im 5/32  brain]
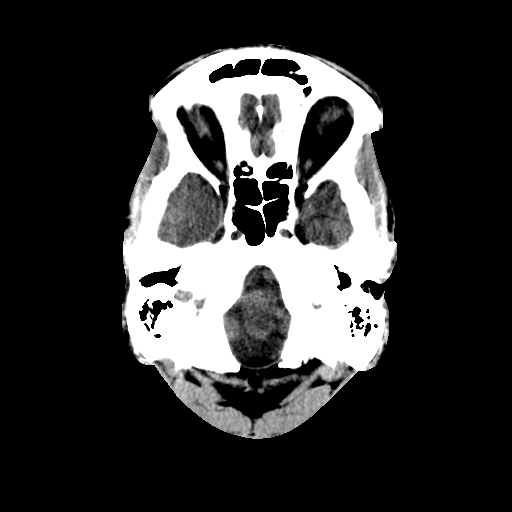
[im 7/32  brain]
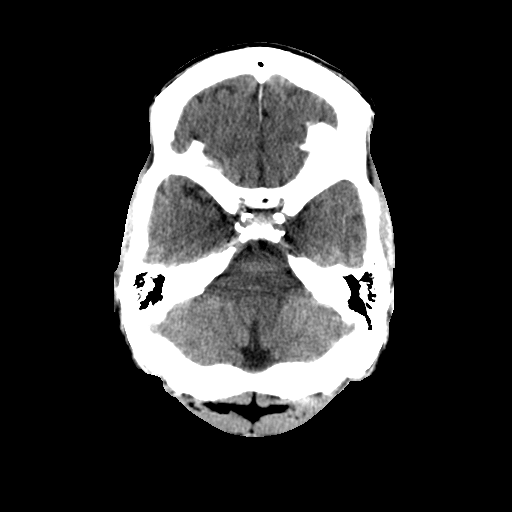
[im 9/32  brain]
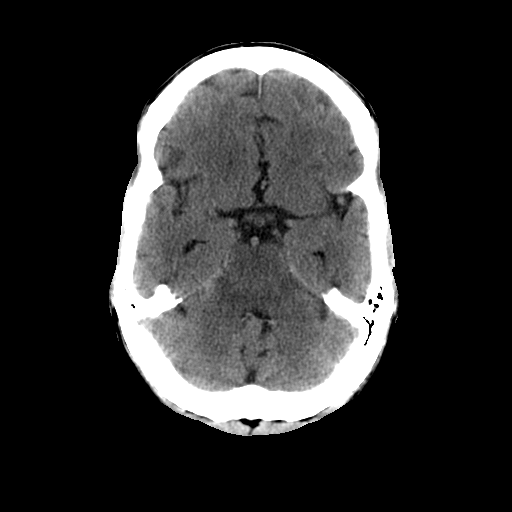
[im 12/32  brain]
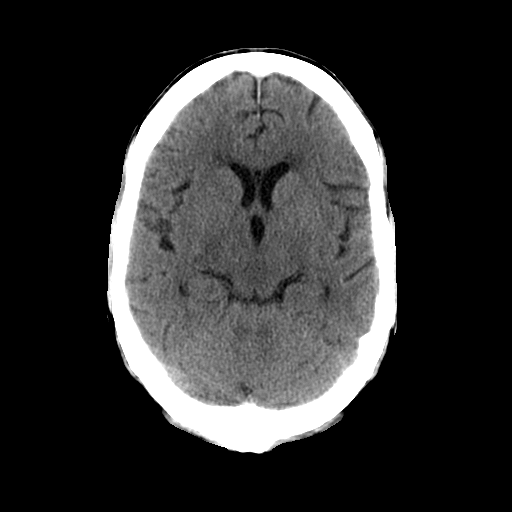
[im 12/32  bone]
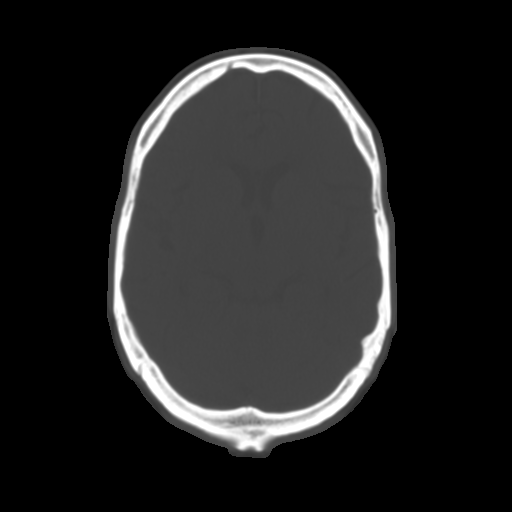
[im 14/32  brain]
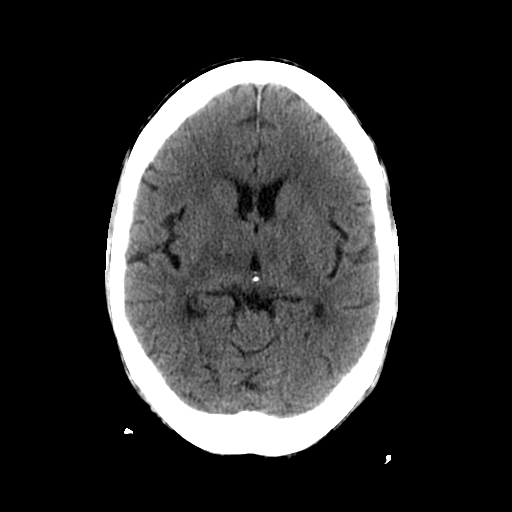
[im 16/32  brain]
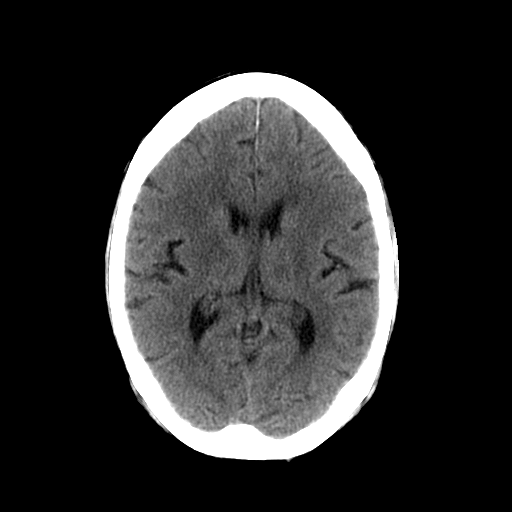
[im 18/32  brain]
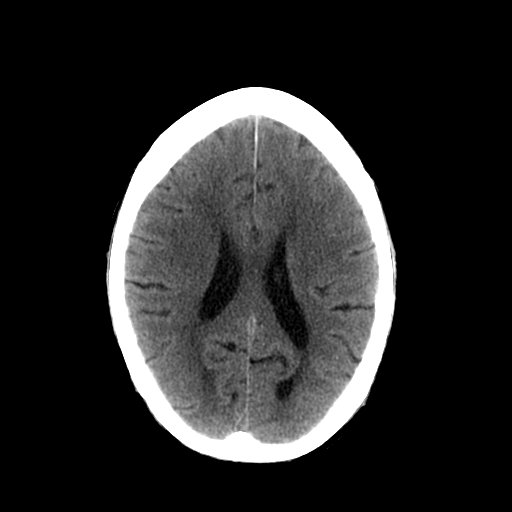
[im 20/32  brain]
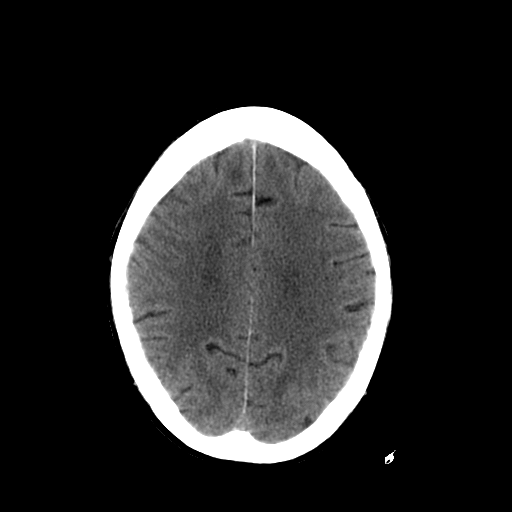
[im 20/32  bone]
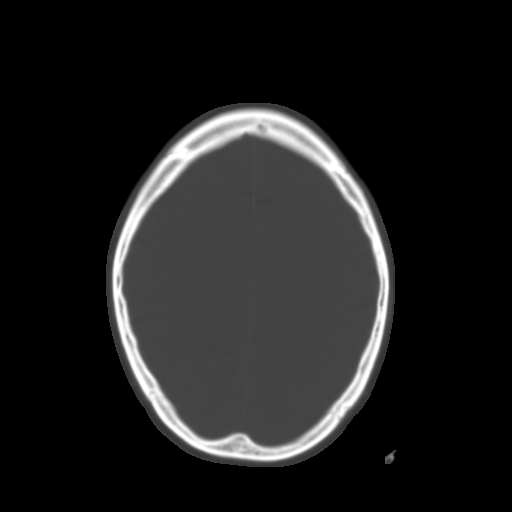
[im 23/32  brain]
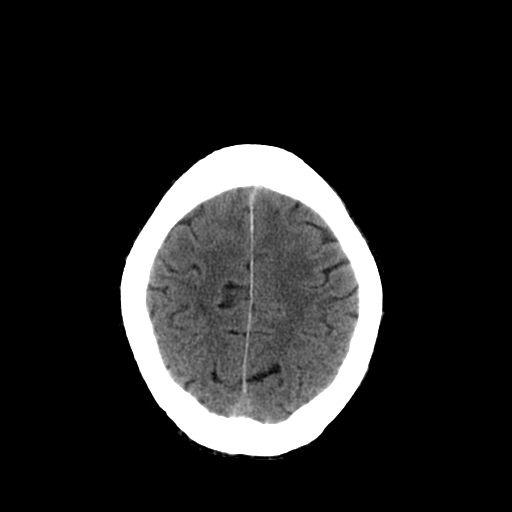
[im 25/32  brain]
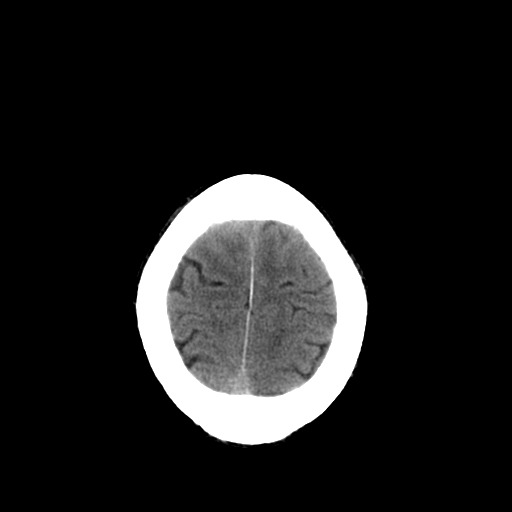
[im 27/32  brain]
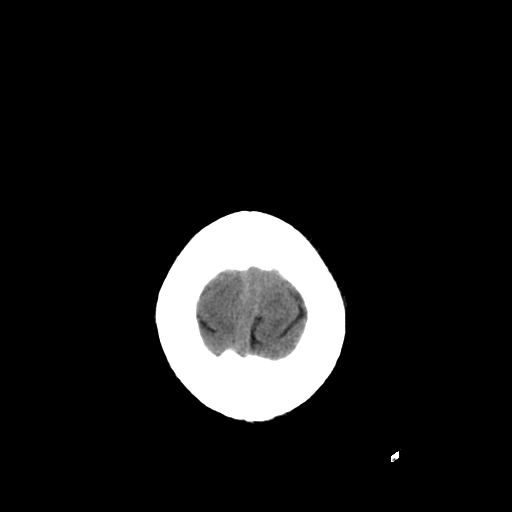
[im 29/32  brain]
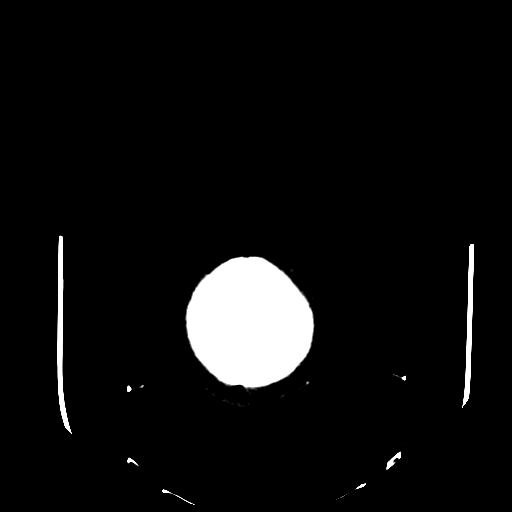
[im 29/32  bone]
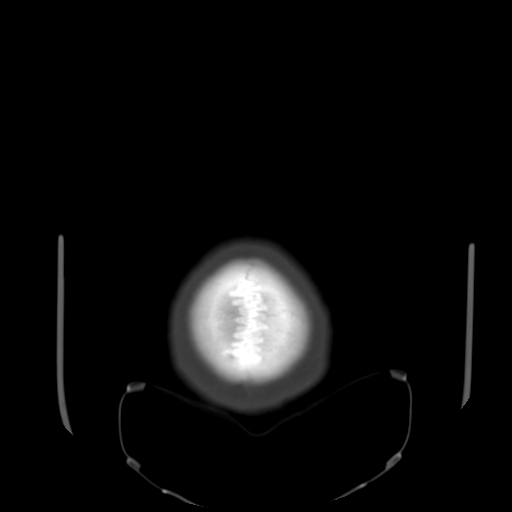

[13 of 30 positions shown; findings below may reference images not displayed]

FINDINGS: No mass lesion, mass effect, midline shift,
hydrocephalus, hemorrhage.  No territorial ischemia or acute
infarction.  Mild chronic ischemic periventricular white matter
disease is present.
IMPRESSION: No acute intracranial abnormality.  Mild chronic ischemic
periventricular white matter disease, possibly associated with
alcoholism.

## 2012-06-23 DIAGNOSIS — D631 Anemia in chronic kidney disease: Secondary | ICD-10-CM | POA: Diagnosis not present

## 2012-06-23 DIAGNOSIS — N186 End stage renal disease: Secondary | ICD-10-CM | POA: Diagnosis not present

## 2012-06-23 DIAGNOSIS — N2581 Secondary hyperparathyroidism of renal origin: Secondary | ICD-10-CM | POA: Diagnosis not present

## 2012-06-23 DIAGNOSIS — D509 Iron deficiency anemia, unspecified: Secondary | ICD-10-CM | POA: Diagnosis not present

## 2012-06-26 DIAGNOSIS — N2581 Secondary hyperparathyroidism of renal origin: Secondary | ICD-10-CM | POA: Diagnosis not present

## 2012-06-26 DIAGNOSIS — D509 Iron deficiency anemia, unspecified: Secondary | ICD-10-CM | POA: Diagnosis not present

## 2012-06-26 DIAGNOSIS — N186 End stage renal disease: Secondary | ICD-10-CM | POA: Diagnosis not present

## 2012-06-26 DIAGNOSIS — D631 Anemia in chronic kidney disease: Secondary | ICD-10-CM | POA: Diagnosis not present

## 2012-06-28 DIAGNOSIS — D509 Iron deficiency anemia, unspecified: Secondary | ICD-10-CM | POA: Diagnosis not present

## 2012-06-28 DIAGNOSIS — N2581 Secondary hyperparathyroidism of renal origin: Secondary | ICD-10-CM | POA: Diagnosis not present

## 2012-06-28 DIAGNOSIS — D631 Anemia in chronic kidney disease: Secondary | ICD-10-CM | POA: Diagnosis not present

## 2012-06-28 DIAGNOSIS — N186 End stage renal disease: Secondary | ICD-10-CM | POA: Diagnosis not present

## 2012-06-30 DIAGNOSIS — N186 End stage renal disease: Secondary | ICD-10-CM | POA: Diagnosis not present

## 2012-06-30 DIAGNOSIS — D509 Iron deficiency anemia, unspecified: Secondary | ICD-10-CM | POA: Diagnosis not present

## 2012-06-30 DIAGNOSIS — N2581 Secondary hyperparathyroidism of renal origin: Secondary | ICD-10-CM | POA: Diagnosis not present

## 2012-06-30 DIAGNOSIS — D631 Anemia in chronic kidney disease: Secondary | ICD-10-CM | POA: Diagnosis not present

## 2012-07-02 DIAGNOSIS — N186 End stage renal disease: Secondary | ICD-10-CM | POA: Diagnosis not present

## 2012-07-02 DIAGNOSIS — N2581 Secondary hyperparathyroidism of renal origin: Secondary | ICD-10-CM | POA: Diagnosis not present

## 2012-07-02 DIAGNOSIS — D631 Anemia in chronic kidney disease: Secondary | ICD-10-CM | POA: Diagnosis not present

## 2012-07-02 DIAGNOSIS — D509 Iron deficiency anemia, unspecified: Secondary | ICD-10-CM | POA: Diagnosis not present

## 2012-07-04 DIAGNOSIS — N186 End stage renal disease: Secondary | ICD-10-CM | POA: Diagnosis not present

## 2012-07-04 DIAGNOSIS — N2581 Secondary hyperparathyroidism of renal origin: Secondary | ICD-10-CM | POA: Diagnosis not present

## 2012-07-04 DIAGNOSIS — D509 Iron deficiency anemia, unspecified: Secondary | ICD-10-CM | POA: Diagnosis not present

## 2012-07-04 DIAGNOSIS — D631 Anemia in chronic kidney disease: Secondary | ICD-10-CM | POA: Diagnosis not present

## 2012-07-07 DIAGNOSIS — N2581 Secondary hyperparathyroidism of renal origin: Secondary | ICD-10-CM | POA: Diagnosis not present

## 2012-07-07 DIAGNOSIS — D509 Iron deficiency anemia, unspecified: Secondary | ICD-10-CM | POA: Diagnosis not present

## 2012-07-07 DIAGNOSIS — D631 Anemia in chronic kidney disease: Secondary | ICD-10-CM | POA: Diagnosis not present

## 2012-07-07 DIAGNOSIS — N186 End stage renal disease: Secondary | ICD-10-CM | POA: Diagnosis not present

## 2012-07-09 DIAGNOSIS — N186 End stage renal disease: Secondary | ICD-10-CM | POA: Diagnosis not present

## 2012-07-09 DIAGNOSIS — D509 Iron deficiency anemia, unspecified: Secondary | ICD-10-CM | POA: Diagnosis not present

## 2012-07-09 DIAGNOSIS — N2581 Secondary hyperparathyroidism of renal origin: Secondary | ICD-10-CM | POA: Diagnosis not present

## 2012-07-09 DIAGNOSIS — D631 Anemia in chronic kidney disease: Secondary | ICD-10-CM | POA: Diagnosis not present

## 2012-07-11 DIAGNOSIS — N2581 Secondary hyperparathyroidism of renal origin: Secondary | ICD-10-CM | POA: Diagnosis not present

## 2012-07-11 DIAGNOSIS — D631 Anemia in chronic kidney disease: Secondary | ICD-10-CM | POA: Diagnosis not present

## 2012-07-11 DIAGNOSIS — N186 End stage renal disease: Secondary | ICD-10-CM | POA: Diagnosis not present

## 2012-07-11 DIAGNOSIS — D509 Iron deficiency anemia, unspecified: Secondary | ICD-10-CM | POA: Diagnosis not present

## 2012-07-14 DIAGNOSIS — N2581 Secondary hyperparathyroidism of renal origin: Secondary | ICD-10-CM | POA: Diagnosis not present

## 2012-07-14 DIAGNOSIS — N186 End stage renal disease: Secondary | ICD-10-CM | POA: Diagnosis not present

## 2012-07-14 DIAGNOSIS — D509 Iron deficiency anemia, unspecified: Secondary | ICD-10-CM | POA: Diagnosis not present

## 2012-07-14 DIAGNOSIS — D631 Anemia in chronic kidney disease: Secondary | ICD-10-CM | POA: Diagnosis not present

## 2012-07-17 DIAGNOSIS — N186 End stage renal disease: Secondary | ICD-10-CM | POA: Diagnosis not present

## 2012-07-17 DIAGNOSIS — N2581 Secondary hyperparathyroidism of renal origin: Secondary | ICD-10-CM | POA: Diagnosis not present

## 2012-07-17 DIAGNOSIS — D509 Iron deficiency anemia, unspecified: Secondary | ICD-10-CM | POA: Diagnosis not present

## 2012-07-17 DIAGNOSIS — D631 Anemia in chronic kidney disease: Secondary | ICD-10-CM | POA: Diagnosis not present

## 2012-07-19 DIAGNOSIS — N186 End stage renal disease: Secondary | ICD-10-CM | POA: Diagnosis not present

## 2012-07-19 DIAGNOSIS — N2581 Secondary hyperparathyroidism of renal origin: Secondary | ICD-10-CM | POA: Diagnosis not present

## 2012-07-19 DIAGNOSIS — D631 Anemia in chronic kidney disease: Secondary | ICD-10-CM | POA: Diagnosis not present

## 2012-07-19 DIAGNOSIS — D509 Iron deficiency anemia, unspecified: Secondary | ICD-10-CM | POA: Diagnosis not present

## 2012-07-21 DIAGNOSIS — D631 Anemia in chronic kidney disease: Secondary | ICD-10-CM | POA: Diagnosis not present

## 2012-07-21 DIAGNOSIS — D509 Iron deficiency anemia, unspecified: Secondary | ICD-10-CM | POA: Diagnosis not present

## 2012-07-21 DIAGNOSIS — N186 End stage renal disease: Secondary | ICD-10-CM | POA: Diagnosis not present

## 2012-07-21 DIAGNOSIS — N2581 Secondary hyperparathyroidism of renal origin: Secondary | ICD-10-CM | POA: Diagnosis not present

## 2012-07-24 DIAGNOSIS — N186 End stage renal disease: Secondary | ICD-10-CM | POA: Diagnosis not present

## 2012-07-24 DIAGNOSIS — N2581 Secondary hyperparathyroidism of renal origin: Secondary | ICD-10-CM | POA: Diagnosis not present

## 2012-07-24 DIAGNOSIS — D631 Anemia in chronic kidney disease: Secondary | ICD-10-CM | POA: Diagnosis not present

## 2012-07-24 DIAGNOSIS — D509 Iron deficiency anemia, unspecified: Secondary | ICD-10-CM | POA: Diagnosis not present

## 2012-07-26 DIAGNOSIS — D509 Iron deficiency anemia, unspecified: Secondary | ICD-10-CM | POA: Diagnosis not present

## 2012-07-26 DIAGNOSIS — D631 Anemia in chronic kidney disease: Secondary | ICD-10-CM | POA: Diagnosis not present

## 2012-07-26 DIAGNOSIS — N2581 Secondary hyperparathyroidism of renal origin: Secondary | ICD-10-CM | POA: Diagnosis not present

## 2012-07-26 DIAGNOSIS — N186 End stage renal disease: Secondary | ICD-10-CM | POA: Diagnosis not present

## 2012-07-28 ENCOUNTER — Encounter: Payer: Self-pay | Admitting: Internal Medicine

## 2012-07-28 DIAGNOSIS — N186 End stage renal disease: Secondary | ICD-10-CM | POA: Diagnosis not present

## 2012-07-28 DIAGNOSIS — N2581 Secondary hyperparathyroidism of renal origin: Secondary | ICD-10-CM | POA: Diagnosis not present

## 2012-07-28 DIAGNOSIS — D631 Anemia in chronic kidney disease: Secondary | ICD-10-CM | POA: Diagnosis not present

## 2012-07-28 DIAGNOSIS — D509 Iron deficiency anemia, unspecified: Secondary | ICD-10-CM | POA: Diagnosis not present

## 2012-07-31 DIAGNOSIS — D631 Anemia in chronic kidney disease: Secondary | ICD-10-CM | POA: Diagnosis not present

## 2012-07-31 DIAGNOSIS — D509 Iron deficiency anemia, unspecified: Secondary | ICD-10-CM | POA: Diagnosis not present

## 2012-07-31 DIAGNOSIS — N186 End stage renal disease: Secondary | ICD-10-CM | POA: Diagnosis not present

## 2012-07-31 DIAGNOSIS — N2581 Secondary hyperparathyroidism of renal origin: Secondary | ICD-10-CM | POA: Diagnosis not present

## 2012-08-02 DIAGNOSIS — N2581 Secondary hyperparathyroidism of renal origin: Secondary | ICD-10-CM | POA: Diagnosis not present

## 2012-08-02 DIAGNOSIS — N186 End stage renal disease: Secondary | ICD-10-CM | POA: Diagnosis not present

## 2012-08-02 DIAGNOSIS — D509 Iron deficiency anemia, unspecified: Secondary | ICD-10-CM | POA: Diagnosis not present

## 2012-08-02 DIAGNOSIS — D631 Anemia in chronic kidney disease: Secondary | ICD-10-CM | POA: Diagnosis not present

## 2012-08-04 DIAGNOSIS — D631 Anemia in chronic kidney disease: Secondary | ICD-10-CM | POA: Diagnosis not present

## 2012-08-04 DIAGNOSIS — N186 End stage renal disease: Secondary | ICD-10-CM | POA: Diagnosis not present

## 2012-08-04 DIAGNOSIS — N2581 Secondary hyperparathyroidism of renal origin: Secondary | ICD-10-CM | POA: Diagnosis not present

## 2012-08-04 DIAGNOSIS — D509 Iron deficiency anemia, unspecified: Secondary | ICD-10-CM | POA: Diagnosis not present

## 2012-08-07 DIAGNOSIS — Z131 Encounter for screening for diabetes mellitus: Secondary | ICD-10-CM | POA: Diagnosis not present

## 2012-08-07 DIAGNOSIS — D631 Anemia in chronic kidney disease: Secondary | ICD-10-CM | POA: Diagnosis not present

## 2012-08-07 DIAGNOSIS — D509 Iron deficiency anemia, unspecified: Secondary | ICD-10-CM | POA: Diagnosis not present

## 2012-08-07 DIAGNOSIS — N2581 Secondary hyperparathyroidism of renal origin: Secondary | ICD-10-CM | POA: Diagnosis not present

## 2012-08-07 DIAGNOSIS — H6120 Impacted cerumen, unspecified ear: Secondary | ICD-10-CM | POA: Diagnosis not present

## 2012-08-07 DIAGNOSIS — N186 End stage renal disease: Secondary | ICD-10-CM | POA: Diagnosis not present

## 2012-08-07 DIAGNOSIS — I10 Essential (primary) hypertension: Secondary | ICD-10-CM | POA: Diagnosis not present

## 2012-08-08 DIAGNOSIS — H5340 Unspecified visual field defects: Secondary | ICD-10-CM | POA: Diagnosis not present

## 2012-08-08 DIAGNOSIS — H35039 Hypertensive retinopathy, unspecified eye: Secondary | ICD-10-CM | POA: Diagnosis not present

## 2012-08-08 DIAGNOSIS — H3581 Retinal edema: Secondary | ICD-10-CM | POA: Diagnosis not present

## 2012-08-09 DIAGNOSIS — N2581 Secondary hyperparathyroidism of renal origin: Secondary | ICD-10-CM | POA: Diagnosis not present

## 2012-08-09 DIAGNOSIS — D509 Iron deficiency anemia, unspecified: Secondary | ICD-10-CM | POA: Diagnosis not present

## 2012-08-09 DIAGNOSIS — D631 Anemia in chronic kidney disease: Secondary | ICD-10-CM | POA: Diagnosis not present

## 2012-08-09 DIAGNOSIS — N186 End stage renal disease: Secondary | ICD-10-CM | POA: Diagnosis not present

## 2012-08-11 DIAGNOSIS — D509 Iron deficiency anemia, unspecified: Secondary | ICD-10-CM | POA: Diagnosis not present

## 2012-08-11 DIAGNOSIS — N2581 Secondary hyperparathyroidism of renal origin: Secondary | ICD-10-CM | POA: Diagnosis not present

## 2012-08-11 DIAGNOSIS — D631 Anemia in chronic kidney disease: Secondary | ICD-10-CM | POA: Diagnosis not present

## 2012-08-11 DIAGNOSIS — N186 End stage renal disease: Secondary | ICD-10-CM | POA: Diagnosis not present

## 2012-08-14 DIAGNOSIS — N2581 Secondary hyperparathyroidism of renal origin: Secondary | ICD-10-CM | POA: Diagnosis not present

## 2012-08-14 DIAGNOSIS — D509 Iron deficiency anemia, unspecified: Secondary | ICD-10-CM | POA: Diagnosis not present

## 2012-08-14 DIAGNOSIS — D631 Anemia in chronic kidney disease: Secondary | ICD-10-CM | POA: Diagnosis not present

## 2012-08-14 DIAGNOSIS — N186 End stage renal disease: Secondary | ICD-10-CM | POA: Diagnosis not present

## 2012-08-16 DIAGNOSIS — D509 Iron deficiency anemia, unspecified: Secondary | ICD-10-CM | POA: Diagnosis not present

## 2012-08-16 DIAGNOSIS — N2581 Secondary hyperparathyroidism of renal origin: Secondary | ICD-10-CM | POA: Diagnosis not present

## 2012-08-16 DIAGNOSIS — N186 End stage renal disease: Secondary | ICD-10-CM | POA: Diagnosis not present

## 2012-08-16 DIAGNOSIS — D631 Anemia in chronic kidney disease: Secondary | ICD-10-CM | POA: Diagnosis not present

## 2012-08-18 DIAGNOSIS — D509 Iron deficiency anemia, unspecified: Secondary | ICD-10-CM | POA: Diagnosis not present

## 2012-08-18 DIAGNOSIS — N186 End stage renal disease: Secondary | ICD-10-CM | POA: Diagnosis not present

## 2012-08-18 DIAGNOSIS — D631 Anemia in chronic kidney disease: Secondary | ICD-10-CM | POA: Diagnosis not present

## 2012-08-18 DIAGNOSIS — N2581 Secondary hyperparathyroidism of renal origin: Secondary | ICD-10-CM | POA: Diagnosis not present

## 2012-08-21 DIAGNOSIS — N186 End stage renal disease: Secondary | ICD-10-CM | POA: Diagnosis not present

## 2012-08-21 DIAGNOSIS — D631 Anemia in chronic kidney disease: Secondary | ICD-10-CM | POA: Diagnosis not present

## 2012-08-21 DIAGNOSIS — D509 Iron deficiency anemia, unspecified: Secondary | ICD-10-CM | POA: Diagnosis not present

## 2012-08-21 DIAGNOSIS — N2581 Secondary hyperparathyroidism of renal origin: Secondary | ICD-10-CM | POA: Diagnosis not present

## 2012-08-23 DIAGNOSIS — N2581 Secondary hyperparathyroidism of renal origin: Secondary | ICD-10-CM | POA: Diagnosis not present

## 2012-08-23 DIAGNOSIS — D509 Iron deficiency anemia, unspecified: Secondary | ICD-10-CM | POA: Diagnosis not present

## 2012-08-23 DIAGNOSIS — D631 Anemia in chronic kidney disease: Secondary | ICD-10-CM | POA: Diagnosis not present

## 2012-08-23 DIAGNOSIS — N186 End stage renal disease: Secondary | ICD-10-CM | POA: Diagnosis not present

## 2012-08-25 DIAGNOSIS — D631 Anemia in chronic kidney disease: Secondary | ICD-10-CM | POA: Diagnosis not present

## 2012-08-25 DIAGNOSIS — N2581 Secondary hyperparathyroidism of renal origin: Secondary | ICD-10-CM | POA: Diagnosis not present

## 2012-08-25 DIAGNOSIS — N186 End stage renal disease: Secondary | ICD-10-CM | POA: Diagnosis not present

## 2012-08-25 DIAGNOSIS — D509 Iron deficiency anemia, unspecified: Secondary | ICD-10-CM | POA: Diagnosis not present

## 2012-08-28 ENCOUNTER — Ambulatory Visit (AMBULATORY_SURGERY_CENTER): Payer: Medicare Other | Admitting: *Deleted

## 2012-08-28 VITALS — Ht 71.0 in | Wt 170.2 lb

## 2012-08-28 DIAGNOSIS — N2581 Secondary hyperparathyroidism of renal origin: Secondary | ICD-10-CM | POA: Diagnosis not present

## 2012-08-28 DIAGNOSIS — Z1211 Encounter for screening for malignant neoplasm of colon: Secondary | ICD-10-CM

## 2012-08-28 DIAGNOSIS — D509 Iron deficiency anemia, unspecified: Secondary | ICD-10-CM | POA: Diagnosis not present

## 2012-08-28 DIAGNOSIS — D631 Anemia in chronic kidney disease: Secondary | ICD-10-CM | POA: Diagnosis not present

## 2012-08-28 DIAGNOSIS — N186 End stage renal disease: Secondary | ICD-10-CM | POA: Diagnosis not present

## 2012-08-28 MED ORDER — MOVIPREP 100 G PO SOLR: ORAL | Status: DC

## 2012-08-28 NOTE — Progress Notes (Signed)
Note sent to Jenny Reichmann Nulty,CRNA regarding patient's family history for malignant hyperthermia in father.

## 2012-08-30 DIAGNOSIS — D509 Iron deficiency anemia, unspecified: Secondary | ICD-10-CM | POA: Diagnosis not present

## 2012-08-30 DIAGNOSIS — N2581 Secondary hyperparathyroidism of renal origin: Secondary | ICD-10-CM | POA: Diagnosis not present

## 2012-08-30 DIAGNOSIS — N186 End stage renal disease: Secondary | ICD-10-CM | POA: Diagnosis not present

## 2012-08-30 DIAGNOSIS — D631 Anemia in chronic kidney disease: Secondary | ICD-10-CM | POA: Diagnosis not present

## 2012-09-04 DIAGNOSIS — D631 Anemia in chronic kidney disease: Secondary | ICD-10-CM | POA: Diagnosis not present

## 2012-09-04 DIAGNOSIS — N2581 Secondary hyperparathyroidism of renal origin: Secondary | ICD-10-CM | POA: Diagnosis not present

## 2012-09-04 DIAGNOSIS — N186 End stage renal disease: Secondary | ICD-10-CM | POA: Diagnosis not present

## 2012-09-04 DIAGNOSIS — D509 Iron deficiency anemia, unspecified: Secondary | ICD-10-CM | POA: Diagnosis not present

## 2012-09-06 DIAGNOSIS — D509 Iron deficiency anemia, unspecified: Secondary | ICD-10-CM | POA: Diagnosis not present

## 2012-09-06 DIAGNOSIS — D631 Anemia in chronic kidney disease: Secondary | ICD-10-CM | POA: Diagnosis not present

## 2012-09-06 DIAGNOSIS — N2581 Secondary hyperparathyroidism of renal origin: Secondary | ICD-10-CM | POA: Diagnosis not present

## 2012-09-06 DIAGNOSIS — N186 End stage renal disease: Secondary | ICD-10-CM | POA: Diagnosis not present

## 2012-09-08 DIAGNOSIS — D509 Iron deficiency anemia, unspecified: Secondary | ICD-10-CM | POA: Diagnosis not present

## 2012-09-08 DIAGNOSIS — N2581 Secondary hyperparathyroidism of renal origin: Secondary | ICD-10-CM | POA: Diagnosis not present

## 2012-09-08 DIAGNOSIS — D631 Anemia in chronic kidney disease: Secondary | ICD-10-CM | POA: Diagnosis not present

## 2012-09-08 DIAGNOSIS — N186 End stage renal disease: Secondary | ICD-10-CM | POA: Diagnosis not present

## 2012-09-11 ENCOUNTER — Encounter: Payer: Medicaid Other | Admitting: Internal Medicine

## 2012-09-11 DIAGNOSIS — D631 Anemia in chronic kidney disease: Secondary | ICD-10-CM | POA: Diagnosis not present

## 2012-09-11 DIAGNOSIS — D509 Iron deficiency anemia, unspecified: Secondary | ICD-10-CM | POA: Diagnosis not present

## 2012-09-11 DIAGNOSIS — N2581 Secondary hyperparathyroidism of renal origin: Secondary | ICD-10-CM | POA: Diagnosis not present

## 2012-09-11 DIAGNOSIS — N186 End stage renal disease: Secondary | ICD-10-CM | POA: Diagnosis not present

## 2012-09-18 ENCOUNTER — Encounter (HOSPITAL_COMMUNITY): Payer: Self-pay | Admitting: Emergency Medicine

## 2012-09-18 ENCOUNTER — Emergency Department (HOSPITAL_COMMUNITY)
Admission: EM | Admit: 2012-09-18 | Discharge: 2012-09-18 | Disposition: A | Discharge status: Home / Self Care | Payer: Medicare Other | Attending: Emergency Medicine | Admitting: Emergency Medicine

## 2012-09-18 DIAGNOSIS — F17200 Nicotine dependence, unspecified, uncomplicated: Secondary | ICD-10-CM | POA: Insufficient documentation

## 2012-09-18 DIAGNOSIS — Z79899 Other long term (current) drug therapy: Secondary | ICD-10-CM | POA: Insufficient documentation

## 2012-09-18 DIAGNOSIS — Y849 Medical procedure, unspecified as the cause of abnormal reaction of the patient, or of later complication, without mention of misadventure at the time of the procedure: Secondary | ICD-10-CM | POA: Insufficient documentation

## 2012-09-18 DIAGNOSIS — T82898A Other specified complication of vascular prosthetic devices, implants and grafts, initial encounter: Secondary | ICD-10-CM | POA: Diagnosis not present

## 2012-09-18 NOTE — ED Notes (Signed)
Pt sts not waiting and will be leaving; pt encouraged to stay; pressure dressing in place and no bleeding noted

## 2012-09-18 NOTE — ED Notes (Signed)
Pt sts oozing blood through dialysis site dressing when arrived home after dialysis today; area dressed with pressure dressing; no bleeding noted at present

## 2012-10-05 ENCOUNTER — Ambulatory Visit (AMBULATORY_SURGERY_CENTER): Payer: Medicare Other | Admitting: Internal Medicine

## 2012-10-05 ENCOUNTER — Encounter: Payer: Self-pay | Admitting: Internal Medicine

## 2012-10-05 VITALS — BP 197/91 | HR 77 | Temp 97.8°F | Resp 16 | Ht 71.0 in | Wt 170.0 lb

## 2012-10-05 DIAGNOSIS — Z1211 Encounter for screening for malignant neoplasm of colon: Secondary | ICD-10-CM | POA: Diagnosis not present

## 2012-10-05 DIAGNOSIS — Z992 Dependence on renal dialysis: Secondary | ICD-10-CM | POA: Diagnosis not present

## 2012-10-05 DIAGNOSIS — K7010 Alcoholic hepatitis without ascites: Secondary | ICD-10-CM | POA: Diagnosis not present

## 2012-10-05 DIAGNOSIS — D126 Benign neoplasm of colon, unspecified: Secondary | ICD-10-CM

## 2012-10-05 DIAGNOSIS — N186 End stage renal disease: Secondary | ICD-10-CM | POA: Diagnosis not present

## 2012-10-05 MED ORDER — SODIUM CHLORIDE 0.9 % IV SOLN: 500.0000 mL | INTRAVENOUS | Status: DC

## 2012-10-05 NOTE — Op Note (Signed)
[place]  [address] [place], [address]   COLONOSCOPY PROCEDURE REPORT  PATIENT: Joshua, Diaz  MR#: [id number] BIRTHDATE: May 05, 1961 , 52  yrs. old GENDER: Male ENDOSCOPIST: Jerene Bears, MD REFERRED BY: Elmarie Shiley, MD PROCEDURE DATE:  10/05/2012 PROCEDURE:   Colonoscopy with snare polypectomy and Colonoscopy with cold biopsy polypectomy ASA CLASS:   Class III INDICATIONS:average risk screening and first colonoscopy. MEDICATIONS: MAC sedation, administered by CRNA and propofol (Diprivan) 400mg  IV  DESCRIPTION OF PROCEDURE:   After the risks benefits and alternatives of the procedure were thoroughly explained, informed consent was obtained.  A digital rectal exam revealed no rectal mass.   The LB [id number]  endoscope was introduced through the anus and advanced to the terminal ileum which was intubated for a short distance. No adverse events experienced.   The quality of the prep was good, using MoviPrep  The instrument was then slowly withdrawn as the colon was fully examined.   COLON FINDINGS: The mucosa appeared normal in the terminal ileum. Two small sessile polyps, measuring 3 mm in size, were found in the descending colon.  Polypectomy was performed with cold forceps. All resections were complete and all polyp tissue was completely retrieved.   Five sessile polyps measuring 4-6 mm in size were found in the sigmoid colon, rectosigmoid colon, and rectum. Polypectomy was performed using cold snare.  All resections were complete and all polyp tissue was completely retrieved. Retroflexed views revealed small internal hemorrhoids. The time to cecum=5 minutes 48 seconds.  Withdrawal time=11 minutes 34 seconds.  The scope was withdrawn and the procedure completed. COMPLICATIONS: There were no complications.  ENDOSCOPIC IMPRESSION: 1.   Normal mucosa in the terminal ileum 2.   Two small sessile polyps, measuring 3 mm in size, were found  in the descending colon; Polypectomy was performed with cold forceps 3.   Five sessile polyps measuring 4-6 mm in size were found in the sigmoid colon, rectosigmoid colon, and rectum; Polypectomy was performed using cold snare 4.   Small internal hemorrhoids  RECOMMENDATIONS: 1.  Hold aspirin, aspirin products, and anti-inflammatory medication for 1 week. 2.  Await pathology results 3.  Timing of repeat colonoscopy will be determined by pathology findings. 4.  You will receive a letter within 1-2 weeks with the results of your biopsy as well as final recommendations.  Please call my office if you have not received a letter after 3 weeks.  eSigned:  Jerene Bears, MD 10/05/2012 3:13 PM                  cc: The Patient

## 2012-10-05 NOTE — Progress Notes (Signed)
Called to room to assist during endoscopic procedure.  Patient ID and intended procedure confirmed with present staff. Received instructions for my participation in the procedure from the performing physician.  

## 2012-10-05 NOTE — Progress Notes (Signed)
Patient did not experience any of the following events: a burn prior to discharge; a fall within the facility; wrong site/side/patient/procedure/implant event; or a hospital transfer or hospital admission upon discharge from the facility. (G8907) Patient did not have preoperative order for IV antibiotic SSI prophylaxis. (G8918) Patient did not have preoperative order for IV antibiotic SSI prophylaxis. (G8918)  

## 2012-10-05 NOTE — Progress Notes (Signed)
Pt is refusing to pass gas, says he does not feel like he has to pass any air and he can not make himself pass gas, when asked to pull his knees up to belly, he refuses stating that he gets leg cramps,    Care partner left building and went across the street to the hospital, pt called care partner on his cell phone and she said she was on her way back

## 2012-10-05 NOTE — Patient Instructions (Addendum)
YOU HAD AN ENDOSCOPIC PROCEDURE TODAY AT THE [facility]: Refer to the procedure report that was given to you for any specific questions about what was found during the examination.  If the procedure report does not answer your questions, please call your gastroenterologist to clarify.  If you requested that your care partner not be given the details of your procedure findings, then the procedure report has been included in a sealed envelope for you to review at your convenience later.  YOU SHOULD EXPECT: Some feelings of bloating in the abdomen. Passage of more gas than usual.  Walking can help get rid of the air that was put into your GI tract during the procedure and reduce the bloating. If you had a lower endoscopy (such as a colonoscopy or flexible sigmoidoscopy) you may notice spotting of blood in your stool or on the toilet paper. If you underwent a bowel prep for your procedure, then you may not have a normal bowel movement for a few days.  DIET: Your first meal following the procedure should be a light meal and then it is ok to progress to your normal diet.  A half-sandwich or bowl of soup is an example of a good first meal.  Heavy or fried foods are harder to digest and may make you feel nauseous or bloated.  Likewise meals heavy in dairy and vegetables can cause extra gas to form and this can also increase the bloating.  Drink plenty of fluids but you should avoid alcoholic beverages for 24 hours.  ACTIVITY: Your care partner should take you home directly after the procedure.  You should plan to take it easy, moving slowly for the rest of the day.  You can resume normal activity the day after the procedure however you should NOT DRIVE or use heavy machinery for 24 hours (because of the sedation medicines used during the test).    SYMPTOMS TO REPORT IMMEDIATELY: A gastroenterologist can be reached at any hour.  During normal business hours, 8:30 AM to 5:00 PM Monday through Friday,  call [phone].  After hours and on weekends, please call the GI answering service at [phone] who will take a message and have the physician on call contact you.   Following lower endoscopy (colonoscopy or flexible sigmoidoscopy):  Excessive amounts of blood in the stool  Significant tenderness or worsening of abdominal pains  Swelling of the abdomen that is new, acute  Fever of 100F or higher  FOLLOW UP: If any biopsies were taken you will be contacted by phone or by letter within the next 1-3 weeks.  Call your gastroenterologist if you have not heard about the biopsies in 3 weeks.  Our staff will call the home number listed on your records the next business day following your procedure to check on you and address any questions or concerns that you may have at that time regarding the information given to you following your procedure. This is a courtesy call and so if there is no answer at the home number and we have not heard from you through the emergency physician on call, we will assume that you have returned to your regular daily activities without incident.  SIGNATURES/CONFIDENTIALITY: You and/or your care partner have signed paperwork which will be entered into your electronic medical record.  These signatures attest to the fact that that the information above on your After Visit Summary has been reviewed and is understood.  Full responsibility of the confidentiality of this   discharge information lies with you and/or your care-partner.  Polyps-handout given  Hemorrhoids-handout given  Hold aspirin, aspirin products, and anti-inflammatory for 1 week (Ibuprofen, motrin, naproxen, etc)  Wait for pathology  Repeat colonscopy will be deteremined by pathology

## 2012-10-06 ENCOUNTER — Telehealth: Payer: Self-pay | Admitting: *Deleted

## 2012-10-06 NOTE — Telephone Encounter (Signed)
  Follow up Call-  Call back number 10/05/2012  Post procedure Call Back phone  # [phone]  Permission to leave phone message Yes     Patient questions:  Do you have a fever, pain , or abdominal swelling? no Pain Score  0 *  Have you tolerated food without any problems? yes  Have you been able to return to your normal activities? yes  Do you have any questions about your discharge instructions: Diet   no Medications  no Follow up visit  no  Do you have questions or concerns about your Care? yes  Actions: * If pain score is 4 or above: No action needed, pain <4.  Pt had a little bit of blood in toilet this morning, advised that if he had anymore to give [place] a call back-adm

## 2012-10-09 DIAGNOSIS — N186 End stage renal disease: Secondary | ICD-10-CM | POA: Diagnosis not present

## 2012-10-10 ENCOUNTER — Encounter: Payer: Self-pay | Admitting: Internal Medicine

## 2012-10-11 DIAGNOSIS — D509 Iron deficiency anemia, unspecified: Secondary | ICD-10-CM | POA: Diagnosis not present

## 2012-10-11 DIAGNOSIS — N2581 Secondary hyperparathyroidism of renal origin: Secondary | ICD-10-CM | POA: Diagnosis not present

## 2012-10-11 DIAGNOSIS — N186 End stage renal disease: Secondary | ICD-10-CM | POA: Diagnosis not present

## 2012-10-11 DIAGNOSIS — D631 Anemia in chronic kidney disease: Secondary | ICD-10-CM | POA: Diagnosis not present

## 2012-11-08 DIAGNOSIS — N186 End stage renal disease: Secondary | ICD-10-CM | POA: Diagnosis not present

## 2012-11-10 DIAGNOSIS — D509 Iron deficiency anemia, unspecified: Secondary | ICD-10-CM | POA: Diagnosis not present

## 2012-11-10 DIAGNOSIS — N186 End stage renal disease: Secondary | ICD-10-CM | POA: Diagnosis not present

## 2012-11-10 DIAGNOSIS — D631 Anemia in chronic kidney disease: Secondary | ICD-10-CM | POA: Diagnosis not present

## 2012-11-10 DIAGNOSIS — N2581 Secondary hyperparathyroidism of renal origin: Secondary | ICD-10-CM | POA: Diagnosis not present

## 2012-12-09 DIAGNOSIS — N186 End stage renal disease: Secondary | ICD-10-CM | POA: Diagnosis not present

## 2012-12-11 DIAGNOSIS — N2581 Secondary hyperparathyroidism of renal origin: Secondary | ICD-10-CM | POA: Diagnosis not present

## 2012-12-11 DIAGNOSIS — D631 Anemia in chronic kidney disease: Secondary | ICD-10-CM | POA: Diagnosis not present

## 2012-12-11 DIAGNOSIS — N186 End stage renal disease: Secondary | ICD-10-CM | POA: Diagnosis not present

## 2012-12-11 DIAGNOSIS — D509 Iron deficiency anemia, unspecified: Secondary | ICD-10-CM | POA: Diagnosis not present

## 2013-01-07 IMAGING — CR DG CHEST 2V
2 series · 2 of 2 positions shown · non-contrast
Comparison: 01/29/2010

CLINICAL DATA: Dialysis patient.  Shortness of breath.  Smoker.

CHEST - 2 VIEW

[w chest pa]
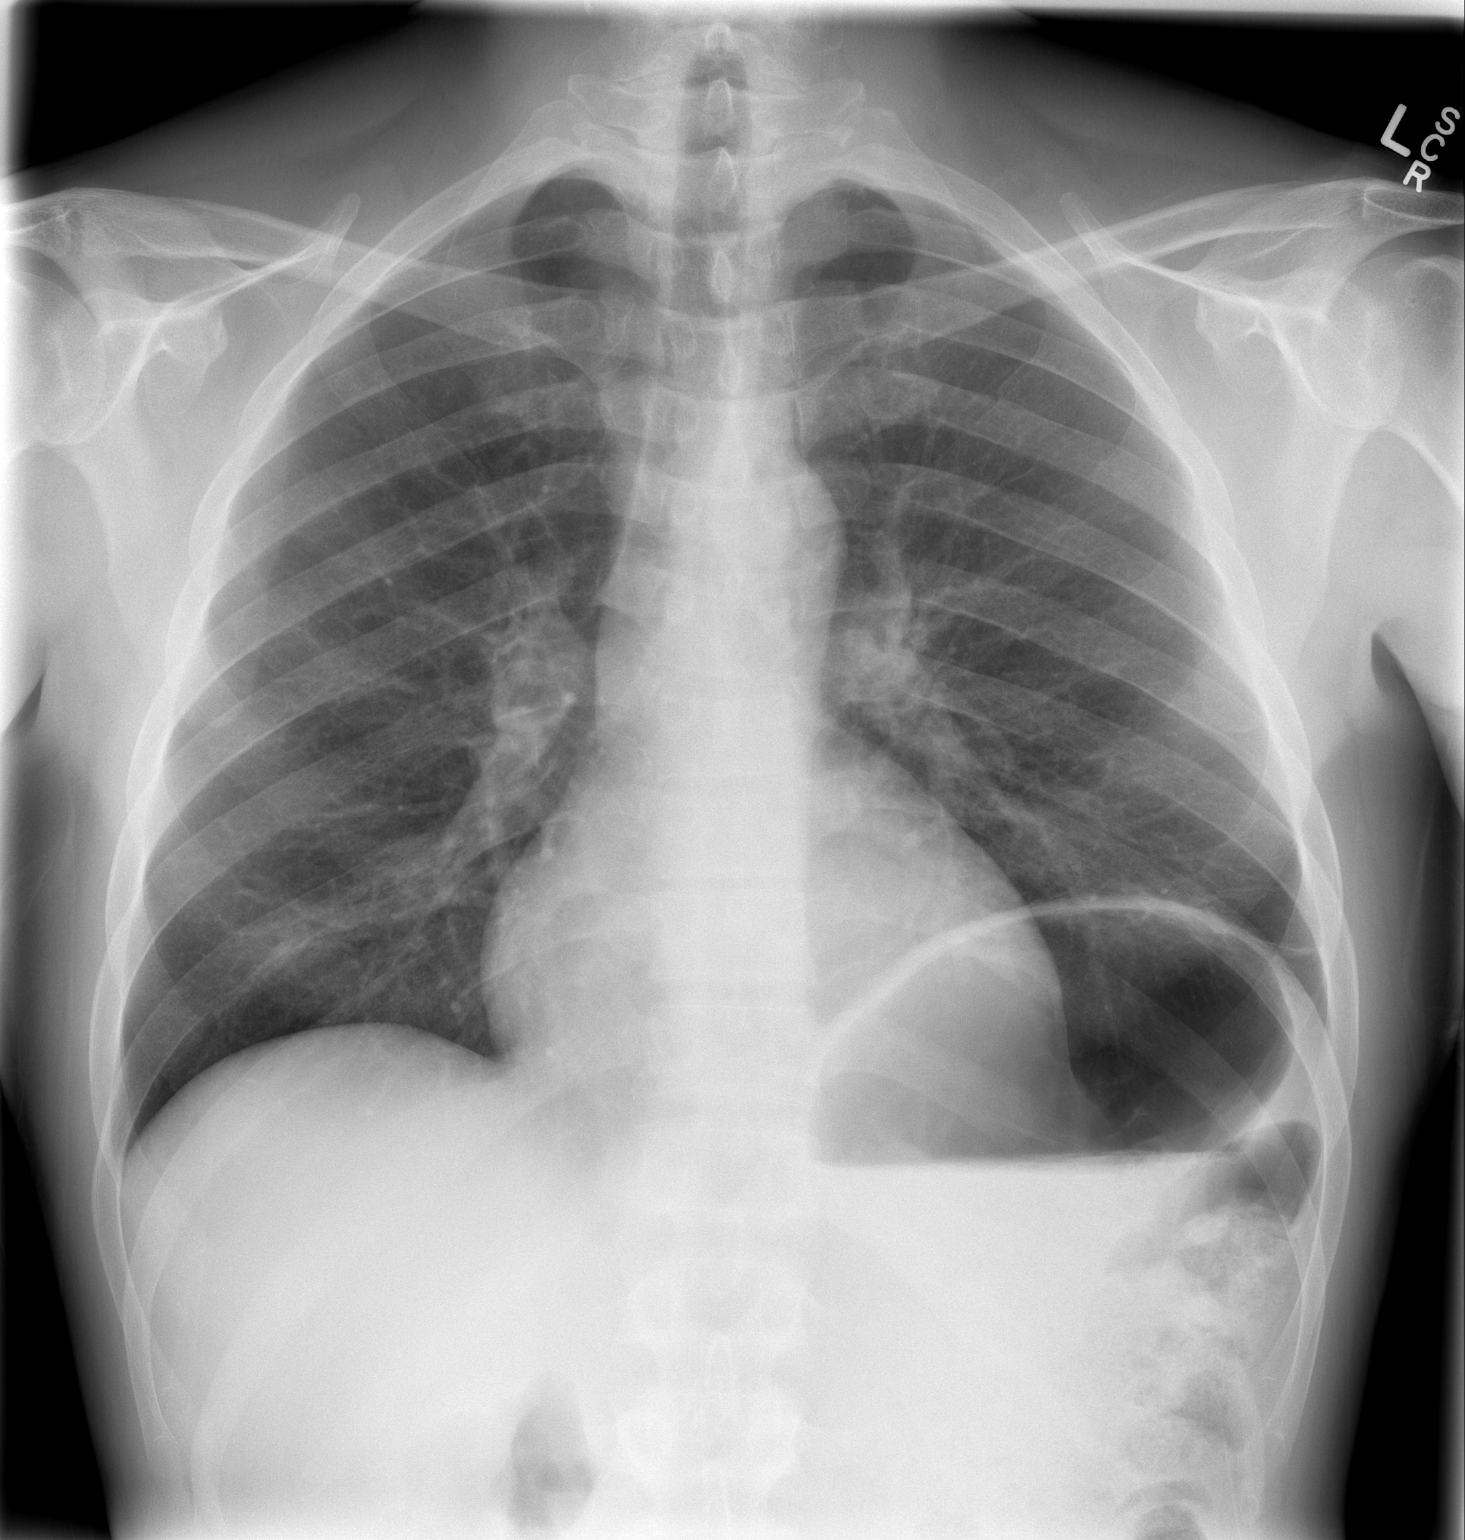

[w chest lat]
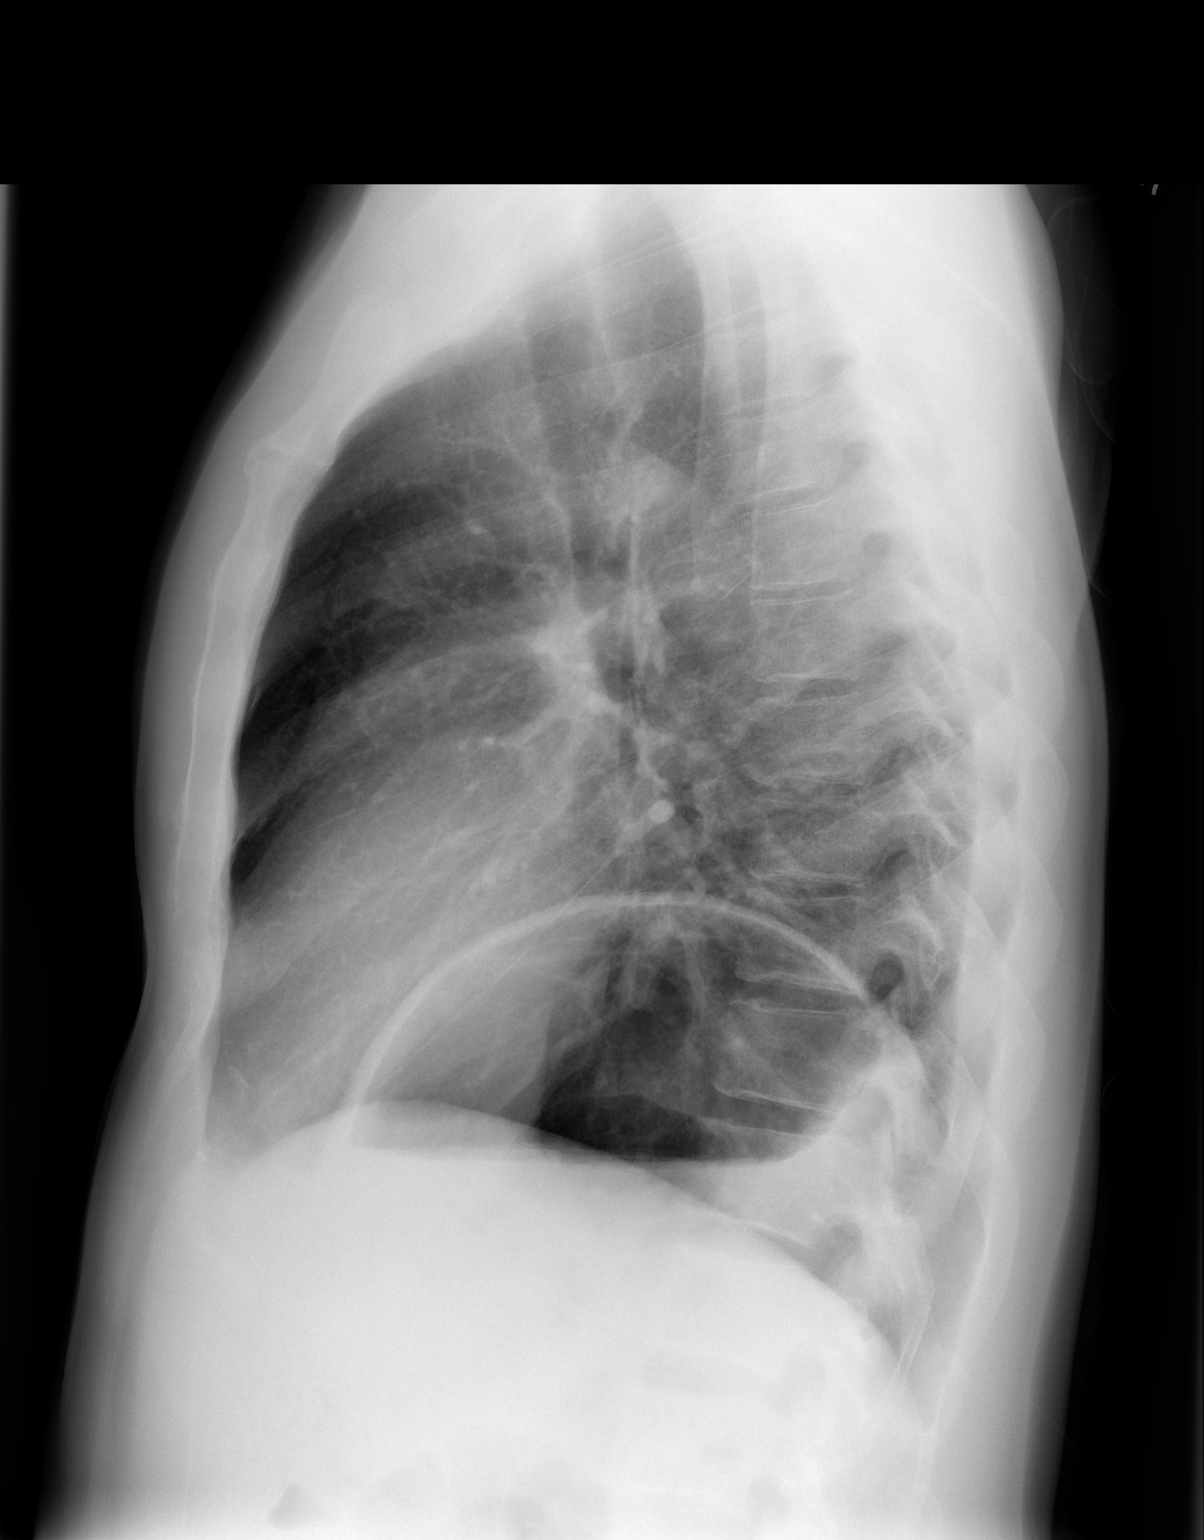

[2 of 2 positions shown; findings below may reference images not displayed]

FINDINGS: Shallow inspiration with elevation of left hemidiaphragm.
Fibrosis in the lung bases.  Normal heart size and pulmonary
vascularity.  Probable mild emphysematous changes.  No focal
airspace consolidation in the lungs.  No blunting of costophrenic
angles.  No pneumothorax.
IMPRESSION: No evidence of active pulmonary disease.  No significant change
since prior study.

## 2013-01-08 DIAGNOSIS — N186 End stage renal disease: Secondary | ICD-10-CM | POA: Diagnosis not present

## 2013-01-10 DIAGNOSIS — N186 End stage renal disease: Secondary | ICD-10-CM | POA: Diagnosis not present

## 2013-01-10 DIAGNOSIS — D509 Iron deficiency anemia, unspecified: Secondary | ICD-10-CM | POA: Diagnosis not present

## 2013-01-10 DIAGNOSIS — D631 Anemia in chronic kidney disease: Secondary | ICD-10-CM | POA: Diagnosis not present

## 2013-01-10 DIAGNOSIS — N2581 Secondary hyperparathyroidism of renal origin: Secondary | ICD-10-CM | POA: Diagnosis not present

## 2013-02-08 DIAGNOSIS — N186 End stage renal disease: Secondary | ICD-10-CM | POA: Diagnosis not present

## 2013-02-09 DIAGNOSIS — D509 Iron deficiency anemia, unspecified: Secondary | ICD-10-CM | POA: Diagnosis not present

## 2013-02-09 DIAGNOSIS — N186 End stage renal disease: Secondary | ICD-10-CM | POA: Diagnosis not present

## 2013-02-09 DIAGNOSIS — N2581 Secondary hyperparathyroidism of renal origin: Secondary | ICD-10-CM | POA: Diagnosis not present

## 2013-02-09 DIAGNOSIS — D631 Anemia in chronic kidney disease: Secondary | ICD-10-CM | POA: Diagnosis not present

## 2013-03-11 DIAGNOSIS — N186 End stage renal disease: Secondary | ICD-10-CM | POA: Diagnosis not present

## 2013-03-12 DIAGNOSIS — N2581 Secondary hyperparathyroidism of renal origin: Secondary | ICD-10-CM | POA: Diagnosis not present

## 2013-03-12 DIAGNOSIS — D509 Iron deficiency anemia, unspecified: Secondary | ICD-10-CM | POA: Diagnosis not present

## 2013-03-12 DIAGNOSIS — D631 Anemia in chronic kidney disease: Secondary | ICD-10-CM | POA: Diagnosis not present

## 2013-03-12 DIAGNOSIS — N186 End stage renal disease: Secondary | ICD-10-CM | POA: Diagnosis not present

## 2013-04-10 DIAGNOSIS — N186 End stage renal disease: Secondary | ICD-10-CM | POA: Diagnosis not present

## 2013-04-11 DIAGNOSIS — D631 Anemia in chronic kidney disease: Secondary | ICD-10-CM | POA: Diagnosis not present

## 2013-04-11 DIAGNOSIS — Z23 Encounter for immunization: Secondary | ICD-10-CM | POA: Diagnosis not present

## 2013-04-11 DIAGNOSIS — N2581 Secondary hyperparathyroidism of renal origin: Secondary | ICD-10-CM | POA: Diagnosis not present

## 2013-04-11 DIAGNOSIS — D509 Iron deficiency anemia, unspecified: Secondary | ICD-10-CM | POA: Diagnosis not present

## 2013-04-11 DIAGNOSIS — N186 End stage renal disease: Secondary | ICD-10-CM | POA: Diagnosis not present

## 2013-05-11 DIAGNOSIS — N186 End stage renal disease: Secondary | ICD-10-CM | POA: Diagnosis not present

## 2013-05-14 DIAGNOSIS — N2581 Secondary hyperparathyroidism of renal origin: Secondary | ICD-10-CM | POA: Diagnosis not present

## 2013-05-14 DIAGNOSIS — N186 End stage renal disease: Secondary | ICD-10-CM | POA: Diagnosis not present

## 2013-05-14 DIAGNOSIS — D509 Iron deficiency anemia, unspecified: Secondary | ICD-10-CM | POA: Diagnosis not present

## 2013-05-14 DIAGNOSIS — D631 Anemia in chronic kidney disease: Secondary | ICD-10-CM | POA: Diagnosis not present

## 2013-06-10 DIAGNOSIS — N186 End stage renal disease: Secondary | ICD-10-CM | POA: Diagnosis not present

## 2013-06-11 DIAGNOSIS — N186 End stage renal disease: Secondary | ICD-10-CM | POA: Diagnosis not present

## 2013-06-11 DIAGNOSIS — D631 Anemia in chronic kidney disease: Secondary | ICD-10-CM | POA: Diagnosis not present

## 2013-06-11 DIAGNOSIS — N2581 Secondary hyperparathyroidism of renal origin: Secondary | ICD-10-CM | POA: Diagnosis not present

## 2013-06-11 DIAGNOSIS — D509 Iron deficiency anemia, unspecified: Secondary | ICD-10-CM | POA: Diagnosis not present

## 2013-07-11 DIAGNOSIS — N186 End stage renal disease: Secondary | ICD-10-CM | POA: Diagnosis not present

## 2013-07-13 DIAGNOSIS — D631 Anemia in chronic kidney disease: Secondary | ICD-10-CM | POA: Diagnosis not present

## 2013-07-13 DIAGNOSIS — N186 End stage renal disease: Secondary | ICD-10-CM | POA: Diagnosis not present

## 2013-07-13 DIAGNOSIS — N2581 Secondary hyperparathyroidism of renal origin: Secondary | ICD-10-CM | POA: Diagnosis not present

## 2013-07-13 DIAGNOSIS — D509 Iron deficiency anemia, unspecified: Secondary | ICD-10-CM | POA: Diagnosis not present

## 2013-08-05 ENCOUNTER — Encounter (HOSPITAL_COMMUNITY): Payer: Self-pay | Admitting: Emergency Medicine

## 2013-08-05 ENCOUNTER — Emergency Department (HOSPITAL_COMMUNITY)
Admission: EM | Admit: 2013-08-05 | Discharge: 2013-08-05 | Disposition: A | Discharge status: Home / Self Care | Payer: Medicare Other | Attending: Emergency Medicine | Admitting: Emergency Medicine

## 2013-08-05 ENCOUNTER — Emergency Department (HOSPITAL_COMMUNITY): Payer: Medicare Other

## 2013-08-05 DIAGNOSIS — D631 Anemia in chronic kidney disease: Secondary | ICD-10-CM | POA: Diagnosis not present

## 2013-08-05 DIAGNOSIS — Z8739 Personal history of other diseases of the musculoskeletal system and connective tissue: Secondary | ICD-10-CM | POA: Insufficient documentation

## 2013-08-05 DIAGNOSIS — R51 Headache: Secondary | ICD-10-CM | POA: Diagnosis not present

## 2013-08-05 DIAGNOSIS — F17200 Nicotine dependence, unspecified, uncomplicated: Secondary | ICD-10-CM | POA: Insufficient documentation

## 2013-08-05 DIAGNOSIS — N186 End stage renal disease: Secondary | ICD-10-CM | POA: Diagnosis not present

## 2013-08-05 DIAGNOSIS — Z79899 Other long term (current) drug therapy: Secondary | ICD-10-CM | POA: Diagnosis not present

## 2013-08-05 DIAGNOSIS — R059 Cough, unspecified: Secondary | ICD-10-CM | POA: Diagnosis not present

## 2013-08-05 DIAGNOSIS — E162 Hypoglycemia, unspecified: Secondary | ICD-10-CM | POA: Diagnosis not present

## 2013-08-05 DIAGNOSIS — R0609 Other forms of dyspnea: Secondary | ICD-10-CM | POA: Diagnosis not present

## 2013-08-05 DIAGNOSIS — N039 Chronic nephritic syndrome with unspecified morphologic changes: Secondary | ICD-10-CM

## 2013-08-05 DIAGNOSIS — R0602 Shortness of breath: Secondary | ICD-10-CM | POA: Diagnosis not present

## 2013-08-05 DIAGNOSIS — N189 Chronic kidney disease, unspecified: Secondary | ICD-10-CM

## 2013-08-05 DIAGNOSIS — Z992 Dependence on renal dialysis: Secondary | ICD-10-CM | POA: Insufficient documentation

## 2013-08-05 DIAGNOSIS — I209 Angina pectoris, unspecified: Secondary | ICD-10-CM | POA: Insufficient documentation

## 2013-08-05 DIAGNOSIS — Z8619 Personal history of other infectious and parasitic diseases: Secondary | ICD-10-CM | POA: Diagnosis not present

## 2013-08-05 DIAGNOSIS — I120 Hypertensive chronic kidney disease with stage 5 chronic kidney disease or end stage renal disease: Secondary | ICD-10-CM | POA: Insufficient documentation

## 2013-08-05 DIAGNOSIS — Z8719 Personal history of other diseases of the digestive system: Secondary | ICD-10-CM | POA: Diagnosis not present

## 2013-08-05 DIAGNOSIS — R6889 Other general symptoms and signs: Secondary | ICD-10-CM | POA: Diagnosis not present

## 2013-08-05 DIAGNOSIS — R05 Cough: Secondary | ICD-10-CM | POA: Insufficient documentation

## 2013-08-05 DIAGNOSIS — R0989 Other specified symptoms and signs involving the circulatory and respiratory systems: Secondary | ICD-10-CM | POA: Diagnosis not present

## 2013-08-05 DIAGNOSIS — I10 Essential (primary) hypertension: Secondary | ICD-10-CM

## 2013-08-05 DIAGNOSIS — R0600 Dyspnea, unspecified: Secondary | ICD-10-CM

## 2013-08-05 LAB — CBC WITH DIFFERENTIAL/PLATELET
Basophils Absolute: 0 10*3/uL (ref 0.0–0.1)
Basophils Relative: 0 % (ref 0–1)
Eosinophils Absolute: 0.2 10*3/uL (ref 0.0–0.7)
Eosinophils Relative: 2 % (ref 0–5)
HCT: 27.9 % — ABNORMAL LOW (ref 39.0–52.0)
Hemoglobin: 9.6 g/dL — ABNORMAL LOW (ref 13.0–17.0)
Lymphocytes Relative: 27 % (ref 12–46)
Lymphs Abs: 2.1 10*3/uL (ref 0.7–4.0)
MCH: 28.2 pg (ref 26.0–34.0)
MCHC: 34.4 g/dL (ref 30.0–36.0)
MCV: 82.1 fL (ref 78.0–100.0)
Monocytes Absolute: 0.5 10*3/uL (ref 0.1–1.0)
Monocytes Relative: 7 % (ref 3–12)
Neutro Abs: 4.8 10*3/uL (ref 1.7–7.7)
Neutrophils Relative %: 64 % (ref 43–77)
Platelets: 121 10*3/uL — ABNORMAL LOW (ref 150–400)
RBC: 3.4 MIL/uL — ABNORMAL LOW (ref 4.22–5.81)
RDW: 16 % — ABNORMAL HIGH (ref 11.5–15.5)
WBC: 7.6 10*3/uL (ref 4.0–10.5)

## 2013-08-05 LAB — GLUCOSE, CAPILLARY
Glucose-Capillary: 96 mg/dL (ref 70–99)
Glucose-Capillary: 107 mg/dL — ABNORMAL HIGH (ref 70–99)
Glucose-Capillary: 29 mg/dL — CL (ref 70–99)
Glucose-Capillary: 50 mg/dL — ABNORMAL LOW (ref 70–99)
Glucose-Capillary: 123 mg/dL — ABNORMAL HIGH (ref 70–99)

## 2013-08-05 LAB — BASIC METABOLIC PANEL
BUN: 39 mg/dL — ABNORMAL HIGH (ref 6–23)
CO2: 23 mEq/L (ref 19–32)
Calcium: 9.2 mg/dL (ref 8.4–10.5)
Chloride: 97 mEq/L (ref 96–112)
Creatinine, Ser: 9.33 mg/dL — ABNORMAL HIGH (ref 0.50–1.35)
GFR calc Af Amer: 7 mL/min — ABNORMAL LOW (ref >90)
GFR calc non Af Amer: 6 mL/min — ABNORMAL LOW (ref >90)
Glucose, Bld: 92 mg/dL (ref 70–99)
Potassium: 5.3 mEq/L (ref 3.7–5.3)
Sodium: 136 mEq/L — ABNORMAL LOW (ref 137–147)

## 2013-08-05 MED ORDER — DEXTROSE 50 % IV SOLN
1.0000 | Freq: Once | INTRAVENOUS | Status: AC
  Administered 2013-08-05: 50 mL via INTRAVENOUS

## 2013-08-05 MED ORDER — SODIUM BICARBONATE 8.4 % IV SOLN
50.0000 meq | Freq: Once | INTRAVENOUS | Status: AC
  Administered 2013-08-05: 50 meq via INTRAVENOUS
  Filled 2013-08-05: qty 50

## 2013-08-05 MED ORDER — IPRATROPIUM-ALBUTEROL 0.5-2.5 (3) MG/3ML IN SOLN
3.0000 mL | Freq: Once | RESPIRATORY_TRACT | Status: AC
  Administered 2013-08-05: 3 mL via RESPIRATORY_TRACT
  Filled 2013-08-05: qty 3

## 2013-08-05 MED ORDER — INSULIN ASPART 100 UNIT/ML ~~LOC~~ SOLN
10.0000 [IU] | Freq: Once | SUBCUTANEOUS | Status: AC
  Administered 2013-08-05: 10 [IU] via INTRAVENOUS
  Filled 2013-08-05: qty 1

## 2013-08-05 MED ORDER — ACETAMINOPHEN 325 MG PO TABS
650.0000 mg | ORAL_TABLET | Freq: Once | ORAL | Status: AC
  Administered 2013-08-05: 650 mg via ORAL
  Filled 2013-08-05: qty 2

## 2013-08-05 MED ORDER — ALBUTEROL SULFATE HFA 108 (90 BASE) MCG/ACT IN AERS: 2.0000 | INHALATION_SPRAY | RESPIRATORY_TRACT | Status: DC | PRN

## 2013-08-05 MED ORDER — ALBUTEROL SULFATE (2.5 MG/3ML) 0.083% IN NEBU
2.5000 mg | INHALATION_SOLUTION | Freq: Once | RESPIRATORY_TRACT | Status: DC
  Filled 2013-08-05: qty 3

## 2013-08-05 MED ORDER — DEXTROSE 50 % IV SOLN
INTRAVENOUS | Status: AC
  Filled 2013-08-05: qty 50

## 2013-08-05 MED ORDER — ONDANSETRON HCL 4 MG/2ML IJ SOLN
4.0000 mg | Freq: Once | INTRAMUSCULAR | Status: AC
  Administered 2013-08-05: 4 mg via INTRAVENOUS
  Filled 2013-08-05: qty 2

## 2013-08-05 MED ORDER — SODIUM CHLORIDE 0.9 % IV SOLN
1.0000 g | Freq: Once | INTRAVENOUS | Status: AC
  Administered 2013-08-05: 1 g via INTRAVENOUS
  Filled 2013-08-05: qty 10

## 2013-08-05 MED ORDER — IPRATROPIUM BROMIDE 0.02 % IN SOLN
0.5000 mg | Freq: Once | RESPIRATORY_TRACT | Status: DC
  Filled 2013-08-05: qty 2.5

## 2013-08-05 MED ORDER — DEXTROSE 50 % IV SOLN
1.0000 | Freq: Once | INTRAVENOUS | Status: AC
  Administered 2013-08-05: 50 mL via INTRAVENOUS
  Filled 2013-08-05: qty 50

## 2013-08-05 MED ORDER — LABETALOL HCL 5 MG/ML IV SOLN
20.0000 mg | INTRAVENOUS | Status: DC | PRN
  Administered 2013-08-05 (×5): 20 mg via INTRAVENOUS
  Filled 2013-08-05 (×5): qty 4

## 2013-08-05 MED ORDER — INSULIN ASPART 100 UNIT/ML IV SOLN: 10.0000 [IU] | Freq: Once | INTRAVENOUS | Status: DC

## 2013-08-05 MED ORDER — DEXTROSE 50 % IV SOLN
50.0000 mL | Freq: Once | INTRAVENOUS | Status: AC
  Administered 2013-08-05: 50 mL via INTRAVENOUS
  Filled 2013-08-05: qty 50

## 2013-08-05 NOTE — ED Notes (Signed)
CBG 96. 

## 2013-08-05 NOTE — ED Provider Notes (Signed)
CSN: [id number]     Arrival date & time 08/05/13  0440 History   First MD Initiated Contact with Patient 08/05/13 [phone]     Chief Complaint  Patient presents with  . Shortness of Breath   (Consider location/radiation/quality/duration/timing/severity/associated sxs/prior Treatment) Patient is a 53 y.o. male presenting with shortness of breath. The history is provided by the patient.  Shortness of Breath He had onset this evening of cough and difficulty breathing. Cough is nonproductive. He denies fever or chills or sweats. He denies chest pain, heaviness, tightness, pressure. He is a dialysis patient with last dialysis having been done 2 days ago and he denies any dietary indiscretions since then. He denies any sick contacts. Dyspnea is worse when he lays flat but nothing changes the cough. Nothing makes either symptoms better.  Past Medical History  Diagnosis Date  . Chronic kidney disease   . Hypertension   . Hepatitis B, chronic   . Hepatitis C   . Anemia   . Metabolic bone disease   . Renal disorder   . Angina     DATE UNKNOWN, C/O PERIODICALLY  . GERD (gastroesophageal reflux disease)     DATE UNKNOWN  . Headache(784.0)     OCCASIONALLY  . Dialysis patient     Monday-Wednesday-Friday   Past Surgical History  Procedure Laterality Date  . Av fistula placement  2012    BELIEVED WAS PLACED IN JUNE   Family History  Problem Relation Age of Onset  . Heart disease Mother   . Lung cancer Mother   . Heart disease Father   . Malignant hyperthermia Father   . Hypertension Other   . COPD Other   . Colon cancer Neg Hx   . Throat cancer Sister    History  Substance Use Topics  . Smoking status: Current Every Day Smoker -- 0.50 packs/day for 37 years    Types: Cigarettes  . Smokeless tobacco: Never Used  . Alcohol Use: 2.3 oz/week    1 Glasses of wine, 1 Cans of beer, 1 Shots of liquor, 1 Drinks containing 0.5 oz of alcohol per week     Comment: ETOH USE ,pt does not how  often    Review of Systems  Respiratory: Positive for shortness of breath.   All other systems reviewed and are negative.    Allergies  Tramadol  Home Medications   Current Outpatient Rx  Name  Route  Sig  Dispense  Refill  . amLODipine (NORVASC) 10 MG tablet   Oral   Take 10 mg by mouth daily.         Marland Kitchen EXPIRED: amLODipine (NORVASC) 5 MG tablet   Oral   Take 1 tablet (5 mg total) by mouth daily.   30 tablet   6     Take every night at bedtime for BP   . EXPIRED: carvedilol (COREG) 25 MG tablet   Oral   Take 1 tablet (25 mg total) by mouth 2 (two) times daily with a meal.   60 tablet   6     Hold dose prior to Hemodialysis treatments   . carvedilol (COREG) 25 MG tablet   Oral   Take 25 mg by mouth daily.         . darbepoetin (ARANESP) 25 MCG/0.42ML SOLN   Intravenous   Inject 0.42 mLs (25 mcg total) into the vein every Monday with hemodialysis.   14.28 mL   0     This medication will be  changed to Epogen in the o ...   . ibuprofen (ADVIL,MOTRIN) 800 MG tablet               . lisinopril (PRINIVIL,ZESTRIL) 20 MG tablet               . multivitamin (RENA-VIT) TABS tablet   Oral   Take 1 tablet by mouth daily.   30 tablet   11   . Nutritional Supplements (FEEDING SUPPLEMENT, NEPRO CARB STEADY,) LIQD   Oral   Take 237 mLs by mouth as needed (missed meal during dialysis.).   60 Can   6     Twice daily as needed   . paricalcitol (ZEMPLAR) 5 MCG/ML injection   Intravenous   Inject 0.8 mLs (4 mcg total) into the vein 3 (three) times a week.   1 mL   0     This medication will be given during Hemodialysis  ...   . pentafluoroprop-tetrafluoroeth (GEBAUERS) AERO   Topical   Apply 1 application topically as needed (topical anesthesia while in dialysis.).   1 mL   6     To be applied as needed prior to cannulations    BP 245/100  Pulse 86  Temp(Src) 97.5 F (36.4 C) (Oral)  Resp 26  SpO2 97% Physical Exam  Nursing note and  vitals reviewed.  53 year old male, resting comfortably and in no acute distress. Vital signs are significant for hypertension with blood pressure 245/100, and tachypnea with respiratory rate of 26. Oxygen saturation is 97%, which is normal. Head is normocephalic and atraumatic. PERRLA, EOMI. Oropharynx is clear. Neck is nontender and supple without adenopathy. JVD is present at 90. Back is nontender and there is no CVA tenderness. Lungs have prolonged exhalation phase with scattered expiratory wheezing. There are diminished breath sounds at the bases but no definite rales are heard. Chest is nontender. Heart has regular rate and rhythm without murmur. Abdomen is soft, flat, nontender without masses or hepatosplenomegaly and peristalsis is normoactive. Extremities have no cyanosis or edema, full range of motion is present. AV fistula is present in the left upper arm with prominent thrill. Skin is warm and dry without rash. Neurologic: Mental status is normal, cranial nerves are intact, there are no motor or sensory deficits.  ED Course  Procedures (including critical care time) Labs Review Results for orders placed during the hospital encounter of 08/05/13  CBC WITH DIFFERENTIAL      Result Value Range   WBC 7.6  4.0 - 10.5 K/uL   RBC 3.40 (*) 4.22 - 5.81 MIL/uL   Hemoglobin 9.6 (*) 13.0 - 17.0 g/dL   HCT 27.9 (*) 39.0 - 52.0 %   MCV 82.1  78.0 - 100.0 fL   MCH 28.2  26.0 - 34.0 pg   MCHC 34.4  30.0 - 36.0 g/dL   RDW 16.0 (*) 11.5 - 15.5 %   Platelets 121 (*) 150 - 400 K/uL   Neutrophils Relative % 64  43 - 77 %   Lymphocytes Relative 27  12 - 46 %   Monocytes Relative 7  3 - 12 %   Eosinophils Relative 2  0 - 5 %   Basophils Relative 0  0 - 1 %   Neutro Abs 4.8  1.7 - 7.7 K/uL   Lymphs Abs 2.1  0.7 - 4.0 K/uL   Monocytes Absolute 0.5  0.1 - 1.0 K/uL   Eosinophils Absolute 0.2  0.0 - 0.7 K/uL  Basophils Absolute 0.0  0.0 - 0.1 K/uL   RBC Morphology POLYCHROMASIA PRESENT      WBC Morphology ATYPICAL LYMPHOCYTES    BASIC METABOLIC PANEL      Result Value Range   Sodium 136 (*) 137 - 147 mEq/L   Potassium 5.3  3.7 - 5.3 mEq/L   Chloride 97  96 - 112 mEq/L   CO2 23  19 - 32 mEq/L   Glucose, Bld 92  70 - 99 mg/dL   BUN 39 (*) 6 - 23 mg/dL   Creatinine, Ser 9.33 (*) 0.50 - 1.35 mg/dL   Calcium 9.2  8.4 - 10.5 mg/dL   GFR calc non Af Amer 6 (*) >90 mL/min   GFR calc Af Amer 7 (*) >90 mL/min  GLUCOSE, CAPILLARY      Result Value Range   Glucose-Capillary 96  70 - 99 mg/dL  GLUCOSE, CAPILLARY      Result Value Range   Glucose-Capillary 107 (*) 70 - 99 mg/dL  GLUCOSE, CAPILLARY      Result Value Range   Glucose-Capillary 29 (*) 70 - 99 mg/dL   Comment 1 Call MD NNP PA CNM    GLUCOSE, CAPILLARY      Result Value Range   Glucose-Capillary 50 (*) 70 - 99 mg/dL  GLUCOSE, CAPILLARY      Result Value Range   Glucose-Capillary 123 (*) 70 - 99 mg/dL   Comment 1 Documented in Chart     Comment 2 Notify RN     Imaging Review Dg Chest Port 1 View  08/05/2013   CLINICAL DATA:  Dyspnea  EXAM: PORTABLE CHEST - 1 VIEW  COMPARISON:  DG CHEST 2 VIEW dated 07/09/2011  FINDINGS: Patchy airspace opacities throughout the right mid lung, right lower lung, and left base have developed. Cardiac silhouette is prominent likely due to AP technique. No pneumothorax.  IMPRESSION: Bilateral airspace disease.   Electronically Signed   By: Maryclare Bean M.D.   On: 08/05/2013 07:35    EKG Interpretation    Date/Time:  Sunday August 05 2013 04:54:22 EST Ventricular Rate:  90 PR Interval:  181 QRS Duration: 104 QT Interval:  411 QTC Calculation: 503 R Axis:   60 Text Interpretation:  Sinus rhythm Probable left atrial enlargement Left ventricular hypertrophy Anterior infarct, old Tall T, consider metabolic/ischemic abnrm Prolonged QT interval When compared with ECG of 12/23/2010, tall T waves are now present - worrisome for hyperkalemia Confirmed by [facility]  MD, Stephanie Mcglone ([id number]) on 08/05/2013  5:10:36 AM           CRITICAL CARE Performed by: [id number] Total critical care time: 60 minutes Critical care time was exclusive of separately billable procedures and treating other patients. Critical care was necessary to treat or prevent imminent or life-threatening deterioration. Critical care was time spent personally by me on the following activities: development of treatment plan with patient and/or surrogate as well as nursing, discussions with consultants, evaluation of patient's response to treatment, examination of patient, obtaining history from patient or surrogate, ordering and performing treatments and interventions, ordering and review of laboratory studies, ordering and review of radiographic studies, pulse oximetry and re-evaluation of patient's condition.\ MDM   1. Dyspnea   2. End stage renal disease on dialysis   3. Hypoglycemia   4. Anemia of renal disease   5. Hypertension    Cough of of uncertain etiology. Chest x-ray or be obtained to evaluate for possible pneumonia. However, with no neck vein distention I  am concerned that he actually has CHF. ECG is worrisome for hyperkalemia and electrolytes are ordered. In the meantime, he will be given albuterol with ipratropium via nebulizer, and empiric treatment for hyperkalemia is given with intravenous sodium bicarbonate, intravenous calcium gluconate, and intravenous insulin. He is given IV labetalol for his hypertension..  Patient was given the above-noted treatment but became hypoglycemic and was given D50. Blood sugar only came up to 50 and is given another amp of dextrose and given some relief. At this point, he states that he actually was feeling much better indicating that his dyspnea is probably related to bronchospasm. Laboratory workup also shows atypical lymphocytes suggesting possible viral illness such as influenza. You need to be observed in the emergency department to make sure that he is maintaining an  adequate blood sugar but once this has occurred, he will be discharged with a prescription for albuterol inhaler. Curiously, his measured potassium was only 5.3 which is in the normal range. ECG will be repeated to see if changes of hyperkalemia have resolved with the above noted treatment. Blood pressure has come down to just over [id number] systolic which is still high but tolerable since she will be followed up closely in dialysis tomorrow. Case is signed out to Dr. Stevie Kern to monitor the patient until his blood sugars are stable for discharge.  Delora Fuel, MD [phone] [id number]

## 2013-08-05 NOTE — ED Provider Notes (Signed)
Patient has repeat blood sugar over 100, his blood pressure systolic remains over [id number] however the patient states has baseline systolic blood pressure is 230-240 at baseline with poor control baseline, his room air pulse oximetry is normal at 97%, he is no longer short of breath, he denies any chest pain, he does not want dialysis today even though reexamination of his lungs show a few crackles at the right base but are otherwise clear and his chest x-ray is suggestive of some fluid overload, the patient states he wants no further testing in the emergency department does not want admission does not want dialysis and realizes he may return at anytime if he develops recurrent shortness of breath or other concerns, he does not want antibiotics for the possibility of pneumonia he states he has no fever he is not coughing now he states the only time he was coughing was when he was lying flat on his back if he would sit up or stand up he had no cough whatsoever. Pt appears to have capacity to make his own medical decisions.  Babette Relic, MD 08/05/13 2137

## 2013-08-05 NOTE — ED Notes (Signed)
POCT CBG resulted 123

## 2013-08-05 NOTE — ED Notes (Addendum)
MD informed of pt's BP and dosages of labetalol, continue with PRN 10 minutes ordered plan until pt's SBP is <200.

## 2013-08-05 NOTE — ED Notes (Signed)
Remaining 5 units of insulin novolog given after CBG confirmed: 107.

## 2013-08-05 NOTE — Discharge Instructions (Signed)
Go for your dialysis tomorrow as scheduled.  Albuterol inhalation aerosol What is this medicine? ALBUTEROL (al [name]) is a bronchodilator. It helps open up the airways in your lungs to make it easier to breathe. This medicine is used to treat and to prevent bronchospasm. This medicine may be used for other purposes; ask your health care provider or pharmacist if you have questions. COMMON BRAND NAME(S): Proair HFA, Proventil HFA, Proventil, Respirol , Ventolin HFA, Ventolin What should I tell my health care provider before I take this medicine? They need to know if you have any of the following conditions: -diabetes -heart disease or irregular heartbeat -high blood pressure -pheochromocytoma -seizures -thyroid disease -an unusual or allergic reaction to albuterol, levalbuterol, sulfites, other medicines, foods, dyes, or preservatives -pregnant or trying to get pregnant -breast-feeding How should I use this medicine? This medicine is for inhalation through the mouth. Follow the directions on your prescription label. Take your medicine at regular intervals. Do not use more often than directed. Make sure that you are using your inhaler correctly. Ask you doctor or health care provider if you have any questions. Talk to your pediatrician regarding the use of this medicine in children. Special care may be needed. Overdosage: If you think you have taken too much of this medicine contact a poison control center or emergency room at once. NOTE: This medicine is only for you. Do not share this medicine with others. What if I miss a dose? If you miss a dose, use it as soon as you can. If it is almost time for your next dose, use only that dose. Do not use double or extra doses. What may interact with this medicine? -anti-infectives like chloroquine and pentamidine -caffeine -cisapride -diuretics -medicines for colds -medicines for depression or for emotional or psychotic  conditions -medicines for weight loss including some herbal products -methadone -some antibiotics like clarithromycin, erythromycin, levofloxacin, and linezolid -some heart medicines -steroid hormones like dexamethasone, cortisone, hydrocortisone -theophylline -thyroid hormones This list may not describe all possible interactions. Give your health care provider a list of all the medicines, herbs, non-prescription drugs, or dietary supplements you use. Also tell them if you smoke, drink alcohol, or use illegal drugs. Some items may interact with your medicine. What should I watch for while using this medicine? Tell your doctor or health care professional if your symptoms do not improve. Do not use extra albuterol. If your asthma or bronchitis gets worse while you are using this medicine, call your doctor right away. If your mouth gets dry try chewing sugarless gum or sucking hard candy. Drink water as directed. What side effects may I notice from receiving this medicine? Side effects that you should report to your doctor or health care professional as soon as possible: -allergic reactions like skin rash, itching or hives, swelling of the face, lips, or tongue -breathing problems -chest pain -feeling faint or lightheaded, falls -high blood pressure -irregular heartbeat -fever -muscle cramps or weakness -pain, tingling, numbness in the hands or feet -vomiting Side effects that usually do not require medical attention (report to your doctor or health care professional if they continue or are bothersome): -cough -difficulty sleeping -headache -nervousness or trembling -stomach upset -stuffy or runny nose -throat irritation -unusual taste This list may not describe all possible side effects. Call your doctor for medical advice about side effects. You may report side effects to FDA at 1-800-FDA-1088. Where should I keep my medicine? Keep out of the reach of children.  Store at room  temperature between 15 and 30 degrees C (59 and 86 degrees F). The contents are under pressure and may burst when exposed to heat or flame. Do not freeze. This medicine does not work as well if it is too cold. Throw away any unused medicine after the expiration date. Inhalers need to be thrown away after the labeled number of puffs have been used or by the expiration date; whichever comes first. Ventolin HFA should be thrown away 12 months after removing from foil pouch. Check the instructions that come with your medicine. NOTE: This sheet is a summary. It may not cover all possible information. If you have questions about this medicine, talk to your doctor, pharmacist, or health care provider.  2014, [employer]. (2012-12-14 10:57:17)

## 2013-08-05 NOTE — ED Notes (Signed)
MD made aware of pts BP 

## 2013-08-05 NOTE — ED Notes (Addendum)
EMS reports the pt presents to the department with SOB, coughing, head congestion, and nasal drainage. EMS reports pt is coughing up clear sputum. EMS reports the pt's 02 sats read 99% on RA. Pt is A&O X4. Pt ambulatory upon entry into department. Pt reports when he lays down he cannot breath. Pt reports he feels very SOB after his coughing spells. EMS reports pt is a dialysis pt and dialyzes M,W,F. EMS reports the pt has not been going to dialysis.

## 2013-08-11 DIAGNOSIS — N186 End stage renal disease: Secondary | ICD-10-CM | POA: Diagnosis not present

## 2013-08-13 DIAGNOSIS — D509 Iron deficiency anemia, unspecified: Secondary | ICD-10-CM | POA: Diagnosis not present

## 2013-08-13 DIAGNOSIS — N186 End stage renal disease: Secondary | ICD-10-CM | POA: Diagnosis not present

## 2013-08-13 DIAGNOSIS — N2581 Secondary hyperparathyroidism of renal origin: Secondary | ICD-10-CM | POA: Diagnosis not present

## 2013-08-13 DIAGNOSIS — D631 Anemia in chronic kidney disease: Secondary | ICD-10-CM | POA: Diagnosis not present

## 2013-08-22 DIAGNOSIS — I10 Essential (primary) hypertension: Secondary | ICD-10-CM | POA: Diagnosis not present

## 2013-08-22 DIAGNOSIS — J449 Chronic obstructive pulmonary disease, unspecified: Secondary | ICD-10-CM | POA: Diagnosis not present

## 2013-08-22 DIAGNOSIS — F17200 Nicotine dependence, unspecified, uncomplicated: Secondary | ICD-10-CM | POA: Diagnosis not present

## 2013-08-22 DIAGNOSIS — N186 End stage renal disease: Secondary | ICD-10-CM | POA: Diagnosis not present

## 2013-09-08 DIAGNOSIS — N186 End stage renal disease: Secondary | ICD-10-CM | POA: Diagnosis not present

## 2013-09-10 DIAGNOSIS — N2581 Secondary hyperparathyroidism of renal origin: Secondary | ICD-10-CM | POA: Diagnosis not present

## 2013-09-10 DIAGNOSIS — N186 End stage renal disease: Secondary | ICD-10-CM | POA: Diagnosis not present

## 2013-09-10 DIAGNOSIS — D509 Iron deficiency anemia, unspecified: Secondary | ICD-10-CM | POA: Diagnosis not present

## 2013-09-10 DIAGNOSIS — D631 Anemia in chronic kidney disease: Secondary | ICD-10-CM | POA: Diagnosis not present

## 2013-09-27 DIAGNOSIS — H35039 Hypertensive retinopathy, unspecified eye: Secondary | ICD-10-CM | POA: Diagnosis not present

## 2013-10-09 DIAGNOSIS — N186 End stage renal disease: Secondary | ICD-10-CM | POA: Diagnosis not present

## 2013-10-10 DIAGNOSIS — N186 End stage renal disease: Secondary | ICD-10-CM | POA: Diagnosis not present

## 2013-10-10 DIAGNOSIS — D509 Iron deficiency anemia, unspecified: Secondary | ICD-10-CM | POA: Diagnosis not present

## 2013-10-10 DIAGNOSIS — N039 Chronic nephritic syndrome with unspecified morphologic changes: Secondary | ICD-10-CM | POA: Diagnosis not present

## 2013-10-10 DIAGNOSIS — D631 Anemia in chronic kidney disease: Secondary | ICD-10-CM | POA: Diagnosis not present

## 2013-10-10 DIAGNOSIS — N2581 Secondary hyperparathyroidism of renal origin: Secondary | ICD-10-CM | POA: Diagnosis not present

## 2013-10-22 ENCOUNTER — Ambulatory Visit (HOSPITAL_COMMUNITY)
Admission: EM | Admit: 2013-10-22 | Discharge: 2013-10-22 | DRG: 682 | Discharge status: Against Medical Advice | Payer: Medicare Other | Attending: Emergency Medicine | Admitting: Emergency Medicine

## 2013-10-22 ENCOUNTER — Emergency Department (HOSPITAL_COMMUNITY): Payer: Medicare Other

## 2013-10-22 ENCOUNTER — Encounter (HOSPITAL_COMMUNITY): Payer: Self-pay | Admitting: Emergency Medicine

## 2013-10-22 DIAGNOSIS — M899 Disorder of bone, unspecified: Secondary | ICD-10-CM | POA: Insufficient documentation

## 2013-10-22 DIAGNOSIS — I10 Essential (primary) hypertension: Secondary | ICD-10-CM | POA: Diagnosis not present

## 2013-10-22 DIAGNOSIS — E875 Hyperkalemia: Secondary | ICD-10-CM | POA: Insufficient documentation

## 2013-10-22 DIAGNOSIS — I120 Hypertensive chronic kidney disease with stage 5 chronic kidney disease or end stage renal disease: Secondary | ICD-10-CM | POA: Insufficient documentation

## 2013-10-22 DIAGNOSIS — Z79899 Other long term (current) drug therapy: Secondary | ICD-10-CM | POA: Diagnosis not present

## 2013-10-22 DIAGNOSIS — Z992 Dependence on renal dialysis: Secondary | ICD-10-CM | POA: Diagnosis not present

## 2013-10-22 DIAGNOSIS — M949 Disorder of cartilage, unspecified: Secondary | ICD-10-CM

## 2013-10-22 DIAGNOSIS — B180 Chronic viral hepatitis B with delta-agent: Secondary | ICD-10-CM | POA: Insufficient documentation

## 2013-10-22 DIAGNOSIS — F17200 Nicotine dependence, unspecified, uncomplicated: Secondary | ICD-10-CM | POA: Insufficient documentation

## 2013-10-22 DIAGNOSIS — R0603 Acute respiratory distress: Secondary | ICD-10-CM

## 2013-10-22 DIAGNOSIS — J984 Other disorders of lung: Secondary | ICD-10-CM | POA: Diagnosis not present

## 2013-10-22 DIAGNOSIS — J9600 Acute respiratory failure, unspecified whether with hypoxia or hypercapnia: Secondary | ICD-10-CM | POA: Diagnosis not present

## 2013-10-22 DIAGNOSIS — Z9115 Patient's noncompliance with renal dialysis: Secondary | ICD-10-CM | POA: Insufficient documentation

## 2013-10-22 DIAGNOSIS — D638 Anemia in other chronic diseases classified elsewhere: Secondary | ICD-10-CM | POA: Insufficient documentation

## 2013-10-22 DIAGNOSIS — K219 Gastro-esophageal reflux disease without esophagitis: Secondary | ICD-10-CM | POA: Diagnosis not present

## 2013-10-22 DIAGNOSIS — J811 Chronic pulmonary edema: Secondary | ICD-10-CM

## 2013-10-22 DIAGNOSIS — B1920 Unspecified viral hepatitis C without hepatic coma: Secondary | ICD-10-CM | POA: Insufficient documentation

## 2013-10-22 DIAGNOSIS — R6889 Other general symptoms and signs: Secondary | ICD-10-CM | POA: Diagnosis not present

## 2013-10-22 DIAGNOSIS — N186 End stage renal disease: Secondary | ICD-10-CM | POA: Diagnosis not present

## 2013-10-22 DIAGNOSIS — Z91158 Patient's noncompliance with renal dialysis for other reason: Secondary | ICD-10-CM | POA: Diagnosis not present

## 2013-10-22 DIAGNOSIS — R0602 Shortness of breath: Secondary | ICD-10-CM | POA: Diagnosis present

## 2013-10-22 LAB — CBC
HCT: 34.2 % — ABNORMAL LOW (ref 39.0–52.0)
Hemoglobin: 12.2 g/dL — ABNORMAL LOW (ref 13.0–17.0)
MCH: 29.4 pg (ref 26.0–34.0)
MCHC: 35.7 g/dL (ref 30.0–36.0)
MCV: 82.4 fL (ref 78.0–100.0)
Platelets: 114 10*3/uL — ABNORMAL LOW (ref 150–400)
RBC: 4.15 MIL/uL — ABNORMAL LOW (ref 4.22–5.81)
RDW: 17.4 % — ABNORMAL HIGH (ref 11.5–15.5)
WBC: 12.4 10*3/uL — ABNORMAL HIGH (ref 4.0–10.5)

## 2013-10-22 LAB — RENAL FUNCTION PANEL
Albumin: 3.4 g/dL — ABNORMAL LOW (ref 3.5–5.2)
BUN: 55 mg/dL — ABNORMAL HIGH (ref 6–23)
CO2: 18 mEq/L — ABNORMAL LOW (ref 19–32)
Calcium: 9.6 mg/dL (ref 8.4–10.5)
Chloride: 93 mEq/L — ABNORMAL LOW (ref 96–112)
Creatinine, Ser: 10.58 mg/dL — ABNORMAL HIGH (ref 0.50–1.35)
GFR calc Af Amer: 6 mL/min — ABNORMAL LOW (ref >90)
GFR calc non Af Amer: 5 mL/min — ABNORMAL LOW (ref >90)
Glucose, Bld: 84 mg/dL (ref 70–99)
Phosphorus: 7 mg/dL — ABNORMAL HIGH (ref 2.3–4.6)
Potassium: 6.5 mEq/L (ref 3.7–5.3)
Sodium: 131 mEq/L — ABNORMAL LOW (ref 137–147)

## 2013-10-22 LAB — I-STAT CHEM 8, ED
BUN: 78 mg/dL — ABNORMAL HIGH (ref 6–23)
Calcium, Ion: 1.12 mmol/L (ref 1.12–1.23)
Chloride: 99 mEq/L (ref 96–112)
Creatinine, Ser: 10.8 mg/dL — ABNORMAL HIGH (ref 0.50–1.35)
Glucose, Bld: 127 mg/dL — ABNORMAL HIGH (ref 70–99)
HCT: 41 % (ref 39.0–52.0)
Hemoglobin: 13.9 g/dL (ref 13.0–17.0)
Potassium: 6.4 mEq/L — ABNORMAL HIGH (ref 3.7–5.3)
Sodium: 131 mEq/L — ABNORMAL LOW (ref 137–147)
TCO2: 23 mmol/L (ref 0–100)

## 2013-10-22 MED ORDER — ALBUTEROL SULFATE (2.5 MG/3ML) 0.083% IN NEBU
INHALATION_SOLUTION | RESPIRATORY_TRACT | Status: AC
  Filled 2013-10-22: qty 6

## 2013-10-22 MED ORDER — DEXTROSE 50 % IV SOLN
INTRAVENOUS | Status: AC
  Administered 2013-10-22: 50 mL via INTRAVENOUS
  Filled 2013-10-22: qty 50

## 2013-10-22 MED ORDER — DEXTROSE 50 % IV SOLN
50.0000 mL | Freq: Once | INTRAVENOUS | Status: AC
  Administered 2013-10-22: 50 mL via INTRAVENOUS

## 2013-10-22 MED ORDER — ALBUTEROL SULFATE (2.5 MG/3ML) 0.083% IN NEBU
5.0000 mg | INHALATION_SOLUTION | Freq: Once | RESPIRATORY_TRACT | Status: AC
  Administered 2013-10-22: 5 mg via RESPIRATORY_TRACT
  Filled 2013-10-22: qty 6

## 2013-10-22 MED ORDER — AMLODIPINE BESYLATE 10 MG PO TABS: 10.0000 mg | ORAL_TABLET | Freq: Every day | ORAL | Status: DC

## 2013-10-22 MED ORDER — NITROGLYCERIN IN D5W 200-5 MCG/ML-% IV SOLN
2.0000 ug/min | Freq: Once | INTRAVENOUS | Status: AC
  Administered 2013-10-22: 5 ug/min via INTRAVENOUS
  Filled 2013-10-22: qty 250

## 2013-10-22 MED ORDER — DEXTROSE 50 % IV SOLN
25.0000 g | Freq: Once | INTRAVENOUS | Status: AC
  Administered 2013-10-22: 25 g via INTRAVENOUS
  Filled 2013-10-22: qty 50

## 2013-10-22 MED ORDER — LISINOPRIL 40 MG PO TABS: 40.0000 mg | ORAL_TABLET | Freq: Every day | ORAL | Status: DC

## 2013-10-22 MED ORDER — SEVELAMER CARBONATE 800 MG PO TABS: 2400.0000 mg | ORAL_TABLET | Freq: Three times a day (TID) | ORAL | Status: DC

## 2013-10-22 MED ORDER — SODIUM BICARBONATE 8.4 % IV SOLN
50.0000 meq | Freq: Once | INTRAVENOUS | Status: AC
  Administered 2013-10-22: 50 meq via INTRAVENOUS
  Filled 2013-10-22: qty 50

## 2013-10-22 MED ORDER — CARVEDILOL 25 MG PO TABS: 25.0000 mg | ORAL_TABLET | Freq: Two times a day (BID) | ORAL | Status: DC

## 2013-10-22 MED ORDER — ONDANSETRON HCL 4 MG/2ML IJ SOLN
INTRAMUSCULAR | Status: AC
  Administered 2013-10-22: 4 mg
  Filled 2013-10-22: qty 2

## 2013-10-22 MED ORDER — SODIUM CHLORIDE 0.9 % IV SOLN: INTRAVENOUS | Status: DC

## 2013-10-22 MED ORDER — HYDRALAZINE HCL 20 MG/ML IJ SOLN
10.0000 mg | Freq: Once | INTRAMUSCULAR | Status: AC
  Administered 2013-10-22: 10 mg via INTRAVENOUS
  Filled 2013-10-22: qty 1

## 2013-10-22 MED ORDER — IPRATROPIUM BROMIDE 0.02 % IN SOLN
0.5000 mg | Freq: Once | RESPIRATORY_TRACT | Status: AC
  Administered 2013-10-22: 0.5 mg via RESPIRATORY_TRACT
  Filled 2013-10-22: qty 2.5

## 2013-10-22 MED ORDER — HYDRALAZINE HCL 20 MG/ML IJ SOLN: 10.0000 mg | INTRAMUSCULAR | Status: DC | PRN

## 2013-10-22 MED ORDER — LABETALOL HCL 5 MG/ML IV SOLN
20.0000 mg | Freq: Once | INTRAVENOUS | Status: AC
  Administered 2013-10-22: 20 mg via INTRAVENOUS
  Filled 2013-10-22: qty 4

## 2013-10-22 MED ORDER — RENA-VITE PO TABS: 1.0000 | ORAL_TABLET | Freq: Every day | ORAL | Status: DC

## 2013-10-22 MED ORDER — NITROGLYCERIN 0.4 MG SL SUBL
0.4000 mg | SUBLINGUAL_TABLET | SUBLINGUAL | Status: DC | PRN
  Administered 2013-10-22 (×3): 0.4 mg via SUBLINGUAL

## 2013-10-22 MED ORDER — DOXERCALCIFEROL 4 MCG/2ML IV SOLN
6.0000 ug | INTRAVENOUS | Status: DC
  Administered 2013-10-22: 6 ug via INTRAVENOUS
  Filled 2013-10-22: qty 4

## 2013-10-22 MED ORDER — CALCIUM CHLORIDE 10 % IV SOLN
1.0000 g | Freq: Once | INTRAVENOUS | Status: AC
  Administered 2013-10-22: 1 g via INTRAVENOUS
  Filled 2013-10-22: qty 10

## 2013-10-22 MED ORDER — DOXERCALCIFEROL 4 MCG/2ML IV SOLN
INTRAVENOUS | Status: AC
  Administered 2013-10-22: 6 ug via INTRAVENOUS
  Filled 2013-10-22: qty 4

## 2013-10-22 NOTE — Progress Notes (Signed)
Pt transported to HD on servo Bipap and returned to original settings once in room.

## 2013-10-22 NOTE — ED Notes (Signed)
Pt transported to hemodialysis  

## 2013-10-22 NOTE — Progress Notes (Signed)
Patient going home against medical advice; he has BPs >200. Dr. Legrand Como made aware of patient's decision. Pt insistent on going home. He has been issued the AMA form to sign; denies pain, n/v or dizziness.

## 2013-10-22 NOTE — Progress Notes (Signed)
Pt finished HD and says he plans to go home  SBP 210.  O2 sat 95% on RA.  I advised him to stay so BP could be better controlled and we could get a FU CXR.  He refuses and plans to sign out AMA.  He is competent.  I discussed the risk of death if he leaves AMA yet he still intends to leave.

## 2013-10-22 NOTE — ED Notes (Addendum)
Pt presents to department via [facility] for evaluation of SOB and hypertension. Pt began hemodialysis this morning, x9 minutes into treatment started experiencing severe shortness of breath. Respirations labored upon arrival, speaking short phrases. Fistula to L upper arm. Last HD was Friday, admits to ETOH use yesterday. Pt is alert and oriented x4. Initial BP 250/128.

## 2013-10-22 NOTE — H&P (Signed)
[facility] Renal Admission Note  Indication for Consultation:  Management of ESRD/hemodialysis; anemia, hypertension/volume and secondary hyperparathyroidism  HPI: [name] is a 53 y.o. male with a history of hypertension, Hepatitis B, polysubstance abuse, and ESRD on dialysis who presented to the [facility] for his scheduled treatment with significantly elevated blood pressure, systolic > 497 and diastolic > 026, and shortness of breath.  His dialysis was started, but after ten minutes he insisted he could not breath and wanted to go to the hospital.  After his arrival to the ED he was evaluated and placed on BiPAP for oxygen saturation < 90% before emergent dialysis.  His chest x-ray showed pulmonary edema, and his potassium was elevated at 6.5.     Dialysis Orders:   MWF @ [place] 4 hrs      69 kg       2K/2Ca      450/A1.5        Heparin 7400 U       AVF @ LUA    Hectorol 6 mcg        Epogen 6400 U       Venofer 50 mg on Wed  Past Medical History  Diagnosis Date  . Chronic kidney disease   . Hypertension   . Hepatitis B, chronic   . Hepatitis C   . Anemia   . Metabolic bone disease   . Renal disorder   . Angina     DATE UNKNOWN, C/O PERIODICALLY  . GERD (gastroesophageal reflux disease)     DATE UNKNOWN  . Headache(784.0)     OCCASIONALLY  . Dialysis patient     Monday-Wednesday-Friday   Past Surgical History  Procedure Laterality Date  . Av fistula placement  2012    BELIEVED WAS PLACED IN JUNE   Family History  Problem Relation Age of Onset  . Heart disease Mother   . Lung cancer Mother   . Heart disease Father   . Malignant hyperthermia Father   . Hypertension Other   . COPD Other   . Colon cancer Neg Hx   . Throat cancer Sister    Social History He reports that he has been smoking Cigarettes and has a 18.5 pack-year smoking history.   He reports that he drinks about 2.3 ounces of alcohol per week and uses illicit drugs  (Marijuana).  Allergies  Allergen Reactions  . Clonidine Derivatives Other (See Comments)    Clonidine patch causes stomach pain  . Tramadol Itching   Prior to Admission medications   Medication Sig Start Date End Date Taking? Authorizing Provider  albuterol (PROVENTIL HFA;VENTOLIN HFA) 108 (90 BASE) MCG/ACT inhaler Inhale 2 puffs into the lungs every 4 (four) hours as needed for wheezing or shortness of breath (or coughing). 3/78/58  Yes Delora Fuel, MD  carvedilol (COREG) 25 MG tablet Take 25 mg by mouth 2 (two) times daily with a meal.    Yes Historical Provider, MD  pentafluoroprop-tetrafluoroeth (GEBAUERS) AERO Apply 1 application topically as needed (topical anesthesia while in dialysis.). 07/16/11  Yes Luvenia Heller, FNP  amLODipine (NORVASC) 10 MG tablet Take 10 mg by mouth 2 (two) times daily.     Historical Provider, MD  carvedilol (COREG) 25 MG tablet Take 1 tablet (25 mg total) by mouth 2 (two) times daily with a meal. 07/16/11 07/15/12  Luvenia Heller, FNP  lisinopril (PRINIVIL,ZESTRIL) 20 MG tablet Take 20 mg by mouth daily.  09/07/12  Historical Provider, MD  minoxidil (LONITEN) 10 MG tablet Take 5 mg by mouth 2 (two) times daily.  07/17/13   Historical Provider, MD  multivitamin (RENA-VIT) TABS tablet Take 1 tablet by mouth daily. 07/16/11   Luvenia Heller, FNP  RENVELA 800 MG tablet Take 1,600-3,200 mg by mouth 3 (three) times daily with meals. Take 4 tablets (3200 mg) with each meal and 2 (1600 mg) tablets with snacks. 07/09/13   Historical Provider, MD   Labs:  Results for orders placed during the hospital encounter of 10/22/13 (from the past 48 hour(s))  CBC     Status: Abnormal   Collection Time    10/22/13  7:31 AM      Result Value Ref Range   WBC 12.4 (*) 4.0 - 10.5 K/uL   RBC 4.15 (*) 4.22 - 5.81 MIL/uL   Hemoglobin 12.2 (*) 13.0 - 17.0 g/dL   HCT 34.2 (*) 39.0 - 52.0 %   MCV 82.4  78.0 - 100.0 fL   MCH 29.4  26.0 - 34.0 pg   MCHC 35.7  30.0 - 36.0 g/dL   RDW 17.4 (*) 11.5 -  15.5 %   Platelets 114 (*) 150 - 400 K/uL   Comment: REPEATED TO VERIFY     PLATELET COUNT CONFIRMED BY SMEAR  I-STAT CHEM 8, ED     Status: Abnormal   Collection Time    10/22/13  7:35 AM      Result Value Ref Range   Sodium 131 (*) 137 - 147 mEq/L   Potassium 6.4 (*) 3.7 - 5.3 mEq/L   Chloride 99  96 - 112 mEq/L   BUN 78 (*) 6 - 23 mg/dL   Creatinine, Ser 10.80 (*) 0.50 - 1.35 mg/dL   Glucose, Bld 127 (*) 70 - 99 mg/dL   Calcium, Ion 1.12  1.12 - 1.23 mmol/L   TCO2 23  0 - 100 mmol/L   Hemoglobin 13.9  13.0 - 17.0 g/dL   HCT 41.0  39.0 - 52.0 %  RENAL FUNCTION PANEL     Status: Abnormal   Collection Time    10/22/13 10:15 AM      Result Value Ref Range   Sodium 131 (*) 137 - 147 mEq/L   Potassium 6.5 (*) 3.7 - 5.3 mEq/L   Comment: CRITICAL RESULT CALLED TO, READ BACK BY AND VERIFIED WITH:     MAGODO J RN 10/22/13 1128 COSTELLO B   Chloride 93 (*) 96 - 112 mEq/L   CO2 18 (*) 19 - 32 mEq/L   Glucose, Bld 84  70 - 99 mg/dL   BUN 55 (*) 6 - 23 mg/dL   Creatinine, Ser 10.58 (*) 0.50 - 1.35 mg/dL   Calcium 9.6  8.4 - 10.5 mg/dL   Phosphorus 7.0 (*) 2.3 - 4.6 mg/dL   Albumin 3.4 (*) 3.5 - 5.2 g/dL   GFR calc non Af Amer 5 (*) >90 mL/min   GFR calc Af Amer 6 (*) >90 mL/min   Comment: (NOTE)     The eGFR has been calculated using the CKD EPI equation.     This calculation has not been validated in all clinical situations.     eGFR's persistently <90 mL/min signify possible Chronic Kidney     Disease.   Constitutional: negative for chills, fatigue, fevers and sweats Ears, nose, mouth, throat, and face: negative for earaches, hoarseness, nasal congestion and sore throat Respiratory: positive for dyspnea,negative for cough, hemoptysis and sputum Cardiovascular: positive for  chest pressure/discomfort, dyspnea and orthopnea, negative for chest pain and palpitations Gastrointestinal: negative for abdominal pain, change in bowel habits, nausea and vomiting Genitourinary:negative,  oliguric Musculoskeletal:negative for arthralgias, back pain, myalgias and neck pain Neurological: negative for dizziness, gait problems, headaches and speech problems  Physical Exam: Filed Vitals:   10/22/13 1300  BP: 193/94  Pulse: 77  Resp: 19     General appearance: alert, cooperative and moderate distress Head: Normocephalic, without obvious abnormality, atraumatic Neck: no adenopathy, no carotid bruit, no JVD and supple, symmetrical, trachea midline Resp: Scattered bilateral rales with mild expiratory wheezes Cardio: Tachycardic without murmur or rub GI: soft, non-tender; bowel sounds normal; no masses,  no organomegaly Extremities: extremities normal, atraumatic, no cyanosis or edema Neurologic: Grossly normal Dialysis Access: AVF @ LUA with + bruit   Assessment/Plan: 1. Respiratory distress - sec to fluid overload & ? COPD, chest x-ray with pulmonary edema, breathing better on BiPAP. 2. Hypertension - severely elevated, initially 280/150, non-compliant with outpatient meds (Amlodipine, Carvedilol, Lisinopril, & Minoxidil), denies cocaine use.  Continue meds with IV Hydralazine, hold Minoxidil. 3. Hyperkalemia - K 6.5 with HD pending. 4. ESRD - HD on MWF @ [place].  HD today, check CXR tomorrow AM to determine need for HD. 5. Anemia - Hgb 13.9, on outpatient Epogen & Venofer.  Continue weekly Fe. 6. Metabolic bone disease - Ca 9.6 (10.1 corrected), P 7, last iPTH 195; Hectorol 6 mcg, no binders.  Start Renvela. 7. Nutrition - Alb 3,4, renal diet, vitamin. 8. Hepatitis B  [name] 10/22/2013, 1:17 PM   Attending Nephrologist: Joshua Contras, MD I have seen and examined this patient and agree with plan per [name].  53yo BM, noncompliant with meds presented to ER SOB with SBP 280.  CXR shows pulm edema.  K 6.5.  Plan emergent HD and resumption of his BP meds.  May need HD in AM if still has pulm edema. [name]  10/22/2013 3:11 PM

## 2013-10-22 NOTE — ED Provider Notes (Signed)
CSN: [id number]     Arrival date & time 10/22/13  [id number] History   First MD Initiated Contact with Patient 10/22/13 [phone]     Chief Complaint  Patient presents with  . Shortness of Breath  . Hypertension     (Consider location/radiation/quality/duration/timing/severity/associated sxs/prior Treatment) Patient is a 53 y.o. male presenting with shortness of breath and hypertension. The history is provided by the patient and the EMS personnel. The history is limited by the condition of the patient.  Shortness of Breath Associated symptoms: no chest pain, no fever and no headaches   Hypertension Associated symptoms include shortness of breath. Pertinent negatives include no chest pain and no headaches.  pt with hx htn, esrd/hd, presents via ems in respiratory distress from dialyses center.  Pt has dialyses M/W/F, had normal HD this past Friday, today went to dialyses in resp distress, and was noted to be markedly hypertensive.  Dialyses was started, and pt on for 8-9 minutes, when due to resp distress, and some blood leaking around dialyses access site, dialyses was stopped and pt sent to ED. Pt arrives to ED resp distress - limited history due to severe resp distress/bipap - level 5 caveat.  Pt denies cough, fevers or chest pain/discomfort.     Past Medical History  Diagnosis Date  . Chronic kidney disease   . Hypertension   . Hepatitis B, chronic   . Hepatitis C   . Anemia   . Metabolic bone disease   . Renal disorder   . Angina     DATE UNKNOWN, C/O PERIODICALLY  . GERD (gastroesophageal reflux disease)     DATE UNKNOWN  . Headache(784.0)     OCCASIONALLY  . Dialysis patient     Monday-Wednesday-Friday   Past Surgical History  Procedure Laterality Date  . Av fistula placement  2012    BELIEVED WAS PLACED IN JUNE   Family History  Problem Relation Age of Onset  . Heart disease Mother   . Lung cancer Mother   . Heart disease Father   . Malignant hyperthermia Father   .  Hypertension Other   . COPD Other   . Colon cancer Neg Hx   . Throat cancer Sister    History  Substance Use Topics  . Smoking status: Current Every Day Smoker -- 0.50 packs/day for 37 years    Types: Cigarettes  . Smokeless tobacco: Never Used  . Alcohol Use: 2.3 oz/week    1 Glasses of wine, 1 Cans of beer, 1 Shots of liquor, 1 Drinks containing 0.5 oz of alcohol per week     Comment: frequently    Review of Systems  Unable to perform ROS: Severe respiratory distress  Constitutional: Negative for fever.  Respiratory: Positive for shortness of breath.   Cardiovascular: Negative for chest pain.  Neurological: Negative for headaches.  level 5 caveat.       Allergies  Clonidine derivatives and Tramadol  Home Medications   Current Outpatient Rx  Name  Route  Sig  Dispense  Refill  . albuterol (PROVENTIL HFA;VENTOLIN HFA) 108 (90 BASE) MCG/ACT inhaler   Inhalation   Inhale 2 puffs into the lungs every 4 (four) hours as needed for wheezing or shortness of breath (or coughing).   1 Inhaler   0   . amLODipine (NORVASC) 10 MG tablet   Oral   Take 10 mg by mouth 2 (two) times daily.          Marland Kitchen EXPIRED: amLODipine (NORVASC) 5  MG tablet   Oral   Take 1 tablet (5 mg total) by mouth daily.   30 tablet   6     Take every night at bedtime for BP   . EXPIRED: carvedilol (COREG) 25 MG tablet   Oral   Take 1 tablet (25 mg total) by mouth 2 (two) times daily with a meal.   60 tablet   6     Hold dose prior to Hemodialysis treatments   . carvedilol (COREG) 25 MG tablet   Oral   Take 25 mg by mouth 2 (two) times daily with a meal.          . darbepoetin (ARANESP) 25 MCG/0.42ML SOLN   Intravenous   Inject 0.42 mLs (25 mcg total) into the vein every Monday with hemodialysis.   14.28 mL   0     This medication will be changed to Epogen in the o ...   . ibuprofen (ADVIL,MOTRIN) 800 MG tablet   Oral   Take 800 mg by mouth every 8 (eight) hours as needed for  headache or moderate pain.          Marland Kitchen lisinopril (PRINIVIL,ZESTRIL) 20 MG tablet   Oral   Take 20 mg by mouth daily.          . minoxidil (LONITEN) 10 MG tablet   Oral   Take 5 mg by mouth 2 (two) times daily.          . multivitamin (RENA-VIT) TABS tablet   Oral   Take 1 tablet by mouth daily.   30 tablet   11   . Nutritional Supplements (FEEDING SUPPLEMENT, NEPRO CARB STEADY,) LIQD   Oral   Take 237 mLs by mouth as needed (missed meal during dialysis.).   60 Can   6     Twice daily as needed   . paricalcitol (ZEMPLAR) 5 MCG/ML injection   Intravenous   Inject 0.8 mLs (4 mcg total) into the vein 3 (three) times a week.   1 mL   0     This medication will be given during Hemodialysis  ...   . pentafluoroprop-tetrafluoroeth (GEBAUERS) AERO   Topical   Apply 1 application topically as needed (topical anesthesia while in dialysis.).   1 mL   6     To be applied as needed prior to cannulations   . RENVELA 800 MG tablet   Oral   Take 1,600-3,200 mg by mouth 3 (three) times daily with meals. Take 4 tablets (3200 mg) with each meal and 2 (1600 mg) tablets with snacks.          BP 280/150  Pulse 143  Resp 36  SpO2 100% Physical Exam  Nursing note and vitals reviewed. Constitutional: He is oriented to person, place, and time. He appears well-developed and well-nourished. He appears distressed.  Pt in tripod position, marked resp distress, tachycardic.   HENT:  Head: Atraumatic.  Mouth/Throat: Oropharynx is clear and moist.  Eyes: Conjunctivae are normal. Pupils are equal, round, and reactive to light.  Neck: Normal range of motion. Neck supple. JVD present. No tracheal deviation present. No thyromegaly present.  No stiffness or rigidity.   Cardiovascular: Regular rhythm, normal heart sounds and intact distal pulses.  Exam reveals no gallop and no friction rub.   No murmur heard. tachycardic  Pulmonary/Chest: No accessory muscle usage. He is in respiratory  distress. He has rales.  Rales bil. Mild wheezing. resp distress.   Abdominal: Soft.  Bowel sounds are normal. He exhibits no distension and no mass. There is no tenderness. There is no rebound and no guarding.  Genitourinary:  No cva tenderness  Musculoskeletal: Normal range of motion. He exhibits no edema and no tenderness.  Neurological: He is alert and oriented to person, place, and time.  Skin: Skin is warm and dry. No rash noted.  Psychiatric:  Anxious, distressed.     ED Course  Procedures (including critical care time)  Results for orders placed during the hospital encounter of 10/22/13  CBC      Result Value Ref Range   WBC 12.4 (*) 4.0 - 10.5 K/uL   RBC 4.15 (*) 4.22 - 5.81 MIL/uL   Hemoglobin 12.2 (*) 13.0 - 17.0 g/dL   HCT 34.2 (*) 39.0 - 52.0 %   MCV 82.4  78.0 - 100.0 fL   MCH 29.4  26.0 - 34.0 pg   MCHC 35.7  30.0 - 36.0 g/dL   RDW 17.4 (*) 11.5 - 15.5 %   Platelets 114 (*) 150 - 400 K/uL  RENAL FUNCTION PANEL      Result Value Ref Range   Sodium 131 (*) 137 - 147 mEq/L   Potassium 6.5 (*) 3.7 - 5.3 mEq/L   Chloride 93 (*) 96 - 112 mEq/L   CO2 18 (*) 19 - 32 mEq/L   Glucose, Bld 84  70 - 99 mg/dL   BUN 55 (*) 6 - 23 mg/dL   Creatinine, Ser 10.58 (*) 0.50 - 1.35 mg/dL   Calcium 9.6  8.4 - 10.5 mg/dL   Phosphorus 7.0 (*) 2.3 - 4.6 mg/dL   Albumin 3.4 (*) 3.5 - 5.2 g/dL   GFR calc non Af Amer 5 (*) >90 mL/min   GFR calc Af Amer 6 (*) >90 mL/min  I-STAT CHEM 8, ED      Result Value Ref Range   Sodium 131 (*) 137 - 147 mEq/L   Potassium 6.4 (*) 3.7 - 5.3 mEq/L   Chloride 99  96 - 112 mEq/L   BUN 78 (*) 6 - 23 mg/dL   Creatinine, Ser 10.80 (*) 0.50 - 1.35 mg/dL   Glucose, Bld 127 (*) 70 - 99 mg/dL   Calcium, Ion 1.12  1.12 - 1.23 mmol/L   TCO2 23  0 - 100 mmol/L   Hemoglobin 13.9  13.0 - 17.0 g/dL   HCT 41.0  39.0 - 52.0 %   Dg Chest Port 1 View  10/22/2013   CLINICAL DATA:  Shortness of breath.  Chronic renal failure.  EXAM: PORTABLE CHEST - 1 VIEW   COMPARISON:  08/05/2013  FINDINGS: Cardiomegaly stable. Worsening airspace disease is seen in the mid and lower lung zones bilaterally, right side greater than left. This is suspicious for asymmetric pulmonary edema, with infection considered less likely.  IMPRESSION: Worsening asymmetric lower lung airspace disease, suspicious for pulmonary edema, with infection considered less likely. Stable cardiomegaly.   Electronically Signed   By: Earle Gell M.D.   On: 10/22/2013 08:38      EKG interpretation Date: 10/22/2013  Rate: 139  Rhythm: sinus tachycardia  QRS Axis: left  Intervals: normal  ST/T Wave abnormalities: nonspecific ST/T changes  Conduction Disutrbances:nonspecific intraventricular conduction delay  Narrative Interpretation:   Old EKG Reviewed: changes noted Lvh/repolarization abn, prominent/peaked t waves, similar st/t changes noted on prior ecg 08/05/12    MDM  Iv ns. Continuous pulse ox and monitor. 02. Ecg.  bp extremely high, hx same. ?compliance w meds.  Pt only had 8-9 minutes of dialyses, before they stopped and sent to ED.   Suspect acute resp distress multifactorial related to esrd/hd, uncontrolled htn/htn emergency related pulm edema.  Sl ntg. Iv labetalol.  Bipap.  [facility] called re emergent dialyses, Dr Mercy Moore returned call, pt discussed.  States others needing emergent dialyses currently on machines but he will see, and arrange dialyses.   Respiratory therapy called, bipap.   k high 6.4, cacl iv, hco3, d50, alb.  Mild wheezing, alb and atrovent.   Recheck wheezing improved.   bp mildly improved w meds, needs emergent dialyses as well - attempted to stabilize/temp resp distres pending emergent hd w improvement in bp, bipap, rx hyperkalemia, etc.  Renal service evaluated in ed and arrange for emergent hd.    Pt resp status stabilized/mildly improved on several rechecks.  CRITICAL CARE  RE acute resp distress and failure, esrd on hemodialyses, pulm  edema, severe hypertension/hypertensive urgency, hyperkalemia.  Performed by: Mirna Mires Total critical care time: 40 Critical care time was exclusive of separately billable procedures and treating other patients. Critical care was necessary to treat or prevent imminent or life-threatening deterioration. Critical care was time spent personally by me on the following activities: development of treatment plan with patient and/or surrogate as well as nursing, discussions with consultants, evaluation of patient's response to treatment, examination of patient, obtaining history from patient or surrogate, ordering and performing treatments and interventions, ordering and review of laboratory studies, ordering and review of radiographic studies, pulse oximetry and re-evaluation of patient's condition.      Mirna Mires, MD 10/23/13 [phone]

## 2013-10-22 NOTE — ED Notes (Signed)
Work of breathing decreased, Bipap remains in place. Pt to be taken to dialysis for treatment.

## 2013-10-22 NOTE — Procedures (Signed)
Pt seen on HD.  Ap 130 Vp 230  BFR 350.  SBP 210.  On bipap but stable.  Try to pull 5 liters.

## 2013-11-01 NOTE — Discharge Summary (Signed)
Physician Discharge Summary  Patient ID: [name] MRN: [id number] DOB/AGE: 10/15/1960 53 y.o.  Admit date: 10/22/2013 Discharge date: 11/01/2013  Discharge Diagnoses:  Active Problems: Respiratory distress secondary to pulmonary edema Hyperkalemia Non Compliance with hemodialysis  Chronic Problems: ESRD Hypertension Hepatitis B Anemia of chronic disease Metabolic bone disorder  Discharged Condition:  Unstable  Hospital Course:  [name] is a 53 year old AA male who presented to the [facility] for his scheduled dialysis on 4/13 with significantly elevated blood pressure, systolic > [id number] and diastolic > [id number], and shortness of breath.  His dialysis was started, but after ten minutes he insisted he could not breath and wanted to go to the hospital.  After his arrival to the ED he was evaluated and placed on BiPAP for oxygen saturation < 90% before emergent dialysis.  His chest x-ray showed pulmonary edema, and his potassium was elevated at 6.5.  Although he required BiPAP throughout his dialysis to maintain oxygen saturation > [id number] and his systolic blood pressure remained above 200, he insisted on leaving the hospital after his treatment and signed out AMA.  After dialysis his SBP was 210 with oxygen saturation of 95% on room air.  He was advised to stay to improve his blood pressure and to get a follow-up chest x-ray.  Also the risk of death after leaving AMA was discussed, but the patient still insisted on leaving.  Treatments: dialysis: Hemodialysis Blood pressure 221/113, pulse 95, resp. rate 20, SpO2 93.00%.  Disposition: 07-Left Against Medical Advice  Discharge Orders   Future Orders Complete By Expires   Activity as tolerated  As directed    Avoid straining  As directed    Bring all medications to your doctor's appointment  As directed    CALL 911 for chest discomfort not relieved by NTG or lasting longer than 20 minutes  As directed    Call doctor  for chest discomfort that is more frequent or severe  As directed    Call doctor for fainting or near blackouts  As directed    Call doctor for more than 3-4 kg weight gain between hemodialysis treatments  As directed    Call doctor for shortness of breath, with or without a dry hacking cough  As directed    Call doctor for swellling in the hands, feet or stomach not improved after hemodialysis  As directed    Call doctor if you have to sleep on extra pillows at night in order to breathe  As directed    Continue to follow the Renal Care Notes you received during your hospital stay  As directed    Diet renal 80/90-2-08-13-1198  As directed    Do not skip any hemodialysis appointments unless directed by your doctor  As directed    No Wound Care  As directed    Record daily weight on same scale at same time of day after urinating and before breakfast  As directed    STOP ANY ACTIVITY THAT CAUSES CHEST PAIN, SHORTNESS OF BREATH, DIZZINESS, SWEATING OR EXCESSIVE WEAKNESS  As directed        Medication List         albuterol 108 (90 BASE) MCG/ACT inhaler  Commonly known as:  PROVENTIL HFA;VENTOLIN HFA  Inhale 2 puffs into the lungs every 4 (four) hours as needed for wheezing or shortness of breath (or coughing).     amLODipine 10 MG tablet  Commonly known as:  NORVASC  Take 10 mg  by mouth 2 (two) times daily.     carvedilol 25 MG tablet  Commonly known as:  COREG  Take 25 mg by mouth 2 (two) times daily with a meal.     carvedilol 25 MG tablet  Commonly known as:  COREG  Take 1 tablet (25 mg total) by mouth 2 (two) times daily with a meal.     lisinopril 20 MG tablet  Commonly known as:  PRINIVIL,ZESTRIL  Take 20 mg by mouth daily.     minoxidil 10 MG tablet  Commonly known as:  LONITEN  Take 5 mg by mouth 2 (two) times daily.     multivitamin Tabs tablet  Take 1 tablet by mouth daily.     pentafluoroprop-tetrafluoroeth Aero  Commonly known as:  GEBAUERS  Apply 1 application  topically as needed (topical anesthesia while in dialysis.).     RENVELA 800 MG tablet  Generic drug:  sevelamer carbonate  Take 1,600-3,200 mg by mouth 3 (three) times daily with meals. Take 4 tablets (3200 mg) with each meal and 2 (1600 mg) tablets with snacks.         Signed: Ramiro Harvest 11/01/2013, 11:32 AM

## 2013-11-08 DIAGNOSIS — N186 End stage renal disease: Secondary | ICD-10-CM | POA: Diagnosis not present

## 2013-11-09 DIAGNOSIS — D631 Anemia in chronic kidney disease: Secondary | ICD-10-CM | POA: Diagnosis not present

## 2013-11-09 DIAGNOSIS — N2581 Secondary hyperparathyroidism of renal origin: Secondary | ICD-10-CM | POA: Diagnosis not present

## 2013-11-09 DIAGNOSIS — N186 End stage renal disease: Secondary | ICD-10-CM | POA: Diagnosis not present

## 2013-11-09 DIAGNOSIS — D509 Iron deficiency anemia, unspecified: Secondary | ICD-10-CM | POA: Diagnosis not present

## 2013-11-12 DIAGNOSIS — D509 Iron deficiency anemia, unspecified: Secondary | ICD-10-CM | POA: Diagnosis not present

## 2013-11-12 DIAGNOSIS — N186 End stage renal disease: Secondary | ICD-10-CM | POA: Diagnosis not present

## 2013-11-12 DIAGNOSIS — N2581 Secondary hyperparathyroidism of renal origin: Secondary | ICD-10-CM | POA: Diagnosis not present

## 2013-11-12 DIAGNOSIS — D631 Anemia in chronic kidney disease: Secondary | ICD-10-CM | POA: Diagnosis not present

## 2013-11-14 DIAGNOSIS — D631 Anemia in chronic kidney disease: Secondary | ICD-10-CM | POA: Diagnosis not present

## 2013-11-14 DIAGNOSIS — N186 End stage renal disease: Secondary | ICD-10-CM | POA: Diagnosis not present

## 2013-11-14 DIAGNOSIS — N2581 Secondary hyperparathyroidism of renal origin: Secondary | ICD-10-CM | POA: Diagnosis not present

## 2013-11-14 DIAGNOSIS — D509 Iron deficiency anemia, unspecified: Secondary | ICD-10-CM | POA: Diagnosis not present

## 2013-11-16 DIAGNOSIS — N2581 Secondary hyperparathyroidism of renal origin: Secondary | ICD-10-CM | POA: Diagnosis not present

## 2013-11-16 DIAGNOSIS — N186 End stage renal disease: Secondary | ICD-10-CM | POA: Diagnosis not present

## 2013-11-16 DIAGNOSIS — D509 Iron deficiency anemia, unspecified: Secondary | ICD-10-CM | POA: Diagnosis not present

## 2013-11-16 DIAGNOSIS — D631 Anemia in chronic kidney disease: Secondary | ICD-10-CM | POA: Diagnosis not present

## 2013-11-19 DIAGNOSIS — D509 Iron deficiency anemia, unspecified: Secondary | ICD-10-CM | POA: Diagnosis not present

## 2013-11-19 DIAGNOSIS — D631 Anemia in chronic kidney disease: Secondary | ICD-10-CM | POA: Diagnosis not present

## 2013-11-19 DIAGNOSIS — N186 End stage renal disease: Secondary | ICD-10-CM | POA: Diagnosis not present

## 2013-11-19 DIAGNOSIS — N2581 Secondary hyperparathyroidism of renal origin: Secondary | ICD-10-CM | POA: Diagnosis not present

## 2013-11-21 DIAGNOSIS — D631 Anemia in chronic kidney disease: Secondary | ICD-10-CM | POA: Diagnosis not present

## 2013-11-21 DIAGNOSIS — D509 Iron deficiency anemia, unspecified: Secondary | ICD-10-CM | POA: Diagnosis not present

## 2013-11-21 DIAGNOSIS — N039 Chronic nephritic syndrome with unspecified morphologic changes: Secondary | ICD-10-CM | POA: Diagnosis not present

## 2013-11-21 DIAGNOSIS — N2581 Secondary hyperparathyroidism of renal origin: Secondary | ICD-10-CM | POA: Diagnosis not present

## 2013-11-21 DIAGNOSIS — N186 End stage renal disease: Secondary | ICD-10-CM | POA: Diagnosis not present

## 2013-11-23 DIAGNOSIS — D509 Iron deficiency anemia, unspecified: Secondary | ICD-10-CM | POA: Diagnosis not present

## 2013-11-23 DIAGNOSIS — N2581 Secondary hyperparathyroidism of renal origin: Secondary | ICD-10-CM | POA: Diagnosis not present

## 2013-11-23 DIAGNOSIS — N039 Chronic nephritic syndrome with unspecified morphologic changes: Secondary | ICD-10-CM | POA: Diagnosis not present

## 2013-11-23 DIAGNOSIS — N186 End stage renal disease: Secondary | ICD-10-CM | POA: Diagnosis not present

## 2013-11-23 DIAGNOSIS — D631 Anemia in chronic kidney disease: Secondary | ICD-10-CM | POA: Diagnosis not present

## 2013-11-26 DIAGNOSIS — N186 End stage renal disease: Secondary | ICD-10-CM | POA: Diagnosis not present

## 2013-11-26 DIAGNOSIS — N2581 Secondary hyperparathyroidism of renal origin: Secondary | ICD-10-CM | POA: Diagnosis not present

## 2013-11-26 DIAGNOSIS — D509 Iron deficiency anemia, unspecified: Secondary | ICD-10-CM | POA: Diagnosis not present

## 2013-11-26 DIAGNOSIS — N039 Chronic nephritic syndrome with unspecified morphologic changes: Secondary | ICD-10-CM | POA: Diagnosis not present

## 2013-11-26 DIAGNOSIS — D631 Anemia in chronic kidney disease: Secondary | ICD-10-CM | POA: Diagnosis not present

## 2013-11-28 DIAGNOSIS — N2581 Secondary hyperparathyroidism of renal origin: Secondary | ICD-10-CM | POA: Diagnosis not present

## 2013-11-28 DIAGNOSIS — N039 Chronic nephritic syndrome with unspecified morphologic changes: Secondary | ICD-10-CM | POA: Diagnosis not present

## 2013-11-28 DIAGNOSIS — D509 Iron deficiency anemia, unspecified: Secondary | ICD-10-CM | POA: Diagnosis not present

## 2013-11-28 DIAGNOSIS — N186 End stage renal disease: Secondary | ICD-10-CM | POA: Diagnosis not present

## 2013-11-28 DIAGNOSIS — D631 Anemia in chronic kidney disease: Secondary | ICD-10-CM | POA: Diagnosis not present

## 2013-11-30 DIAGNOSIS — N186 End stage renal disease: Secondary | ICD-10-CM | POA: Diagnosis not present

## 2013-11-30 DIAGNOSIS — D509 Iron deficiency anemia, unspecified: Secondary | ICD-10-CM | POA: Diagnosis not present

## 2013-11-30 DIAGNOSIS — D631 Anemia in chronic kidney disease: Secondary | ICD-10-CM | POA: Diagnosis not present

## 2013-11-30 DIAGNOSIS — N039 Chronic nephritic syndrome with unspecified morphologic changes: Secondary | ICD-10-CM | POA: Diagnosis not present

## 2013-11-30 DIAGNOSIS — N2581 Secondary hyperparathyroidism of renal origin: Secondary | ICD-10-CM | POA: Diagnosis not present

## 2013-12-03 DIAGNOSIS — D631 Anemia in chronic kidney disease: Secondary | ICD-10-CM | POA: Diagnosis not present

## 2013-12-03 DIAGNOSIS — N2581 Secondary hyperparathyroidism of renal origin: Secondary | ICD-10-CM | POA: Diagnosis not present

## 2013-12-03 DIAGNOSIS — N186 End stage renal disease: Secondary | ICD-10-CM | POA: Diagnosis not present

## 2013-12-03 DIAGNOSIS — D509 Iron deficiency anemia, unspecified: Secondary | ICD-10-CM | POA: Diagnosis not present

## 2013-12-05 DIAGNOSIS — N2581 Secondary hyperparathyroidism of renal origin: Secondary | ICD-10-CM | POA: Diagnosis not present

## 2013-12-05 DIAGNOSIS — D631 Anemia in chronic kidney disease: Secondary | ICD-10-CM | POA: Diagnosis not present

## 2013-12-05 DIAGNOSIS — D509 Iron deficiency anemia, unspecified: Secondary | ICD-10-CM | POA: Diagnosis not present

## 2013-12-05 DIAGNOSIS — N186 End stage renal disease: Secondary | ICD-10-CM | POA: Diagnosis not present

## 2013-12-07 DIAGNOSIS — N186 End stage renal disease: Secondary | ICD-10-CM | POA: Diagnosis not present

## 2013-12-07 DIAGNOSIS — D509 Iron deficiency anemia, unspecified: Secondary | ICD-10-CM | POA: Diagnosis not present

## 2013-12-07 DIAGNOSIS — D631 Anemia in chronic kidney disease: Secondary | ICD-10-CM | POA: Diagnosis not present

## 2013-12-07 DIAGNOSIS — N2581 Secondary hyperparathyroidism of renal origin: Secondary | ICD-10-CM | POA: Diagnosis not present

## 2013-12-09 DIAGNOSIS — N186 End stage renal disease: Secondary | ICD-10-CM | POA: Diagnosis not present

## 2013-12-10 DIAGNOSIS — N2581 Secondary hyperparathyroidism of renal origin: Secondary | ICD-10-CM | POA: Diagnosis not present

## 2013-12-10 DIAGNOSIS — N186 End stage renal disease: Secondary | ICD-10-CM | POA: Diagnosis not present

## 2013-12-10 DIAGNOSIS — D509 Iron deficiency anemia, unspecified: Secondary | ICD-10-CM | POA: Diagnosis not present

## 2013-12-10 DIAGNOSIS — D631 Anemia in chronic kidney disease: Secondary | ICD-10-CM | POA: Diagnosis not present

## 2013-12-25 ENCOUNTER — Telehealth: Payer: Self-pay | Admitting: Internal Medicine

## 2013-12-25 NOTE — Telephone Encounter (Signed)
Patient would like to discuss hemorrhoid removal.  He is a dialysis patient only available on Tue or Thurs.  Can I set him up for an appt to see Carlean Purl or Deatra Ina for banding or does he need surgical referral?

## 2013-12-25 NOTE — Telephone Encounter (Signed)
Patient scheduled for 8/25

## 2013-12-25 NOTE — Telephone Encounter (Signed)
See previous phone note.  Do you want to prescribe something for pain or to treat hems prior to appt with Dr. Carlean Purl in Aug?

## 2013-12-25 NOTE — Telephone Encounter (Signed)
He can see Dr. Carlean Purl to consider internal hemorrhoid banding

## 2013-12-26 MED ORDER — HYDROCORTISONE ACETATE 25 MG RE SUPP: 25.0000 mg | Freq: Two times a day (BID) | RECTAL | Status: DC

## 2013-12-26 NOTE — Telephone Encounter (Signed)
Patient notified He will call back if his symptoms fail to resolve

## 2013-12-26 NOTE — Telephone Encounter (Signed)
Anusol-HC suppository 25 mg twice a day x5 days Please have him notify us if his symptoms fail to resolve with this treatment Internal hemorrhoids can bleed, but rarely cause pain. He has rectal pain is an issue after treatment with hydrocortisone, he needs to be seen

## 2013-12-28 ENCOUNTER — Telehealth: Payer: Self-pay | Admitting: *Deleted

## 2013-12-28 NOTE — Telephone Encounter (Signed)
I got a prior authorization request for patient's Anusol. I attempted prior authorization (insurance phone [phone] pt ID [id number]) with patient's insurance. Insurance company states that [employer] is not covered by [employer] "by law". I have requested names of alternatives (phone [phone]) and am told that there are no covered alternatives for Anusol [facility] for internal hemorrhoids. Dr Hilarie Fredrickson, please advise. However, per insuance, Rectocort HC would cost $12 for patient out of pocket.

## 2013-12-30 NOTE — Telephone Encounter (Signed)
[place] for [employer] for rectocort HC BID to TID x 7 days If symptoms fail to improve he should be seen in the office

## 2013-12-31 MED ORDER — HYDROCORTISONE ACETATE 25 MG RE SUPP: RECTAL | Status: DC

## 2013-12-31 NOTE — Telephone Encounter (Signed)
I have spoken to patient and have advised of Dr Vena Rua recommendation for Rectacort in place of Anusol due to cost. Patient verbalizes understanding and is also told that he should return to the office should his symptoms fail to improve.

## 2013-12-31 NOTE — Telephone Encounter (Signed)
I have sent rx for Rectacort to the pharmacy. Left message for patient to call back so I can advise.

## 2014-01-08 DIAGNOSIS — N186 End stage renal disease: Secondary | ICD-10-CM | POA: Diagnosis not present

## 2014-01-09 DIAGNOSIS — D631 Anemia in chronic kidney disease: Secondary | ICD-10-CM | POA: Diagnosis not present

## 2014-01-09 DIAGNOSIS — N039 Chronic nephritic syndrome with unspecified morphologic changes: Secondary | ICD-10-CM | POA: Diagnosis not present

## 2014-01-09 DIAGNOSIS — D509 Iron deficiency anemia, unspecified: Secondary | ICD-10-CM | POA: Diagnosis not present

## 2014-01-09 DIAGNOSIS — N186 End stage renal disease: Secondary | ICD-10-CM | POA: Diagnosis not present

## 2014-01-09 DIAGNOSIS — N2581 Secondary hyperparathyroidism of renal origin: Secondary | ICD-10-CM | POA: Diagnosis not present

## 2014-02-08 DIAGNOSIS — N186 End stage renal disease: Secondary | ICD-10-CM | POA: Diagnosis not present

## 2014-02-11 DIAGNOSIS — D509 Iron deficiency anemia, unspecified: Secondary | ICD-10-CM | POA: Diagnosis not present

## 2014-02-11 DIAGNOSIS — D631 Anemia in chronic kidney disease: Secondary | ICD-10-CM | POA: Diagnosis not present

## 2014-02-11 DIAGNOSIS — N186 End stage renal disease: Secondary | ICD-10-CM | POA: Diagnosis not present

## 2014-02-11 DIAGNOSIS — N2581 Secondary hyperparathyroidism of renal origin: Secondary | ICD-10-CM | POA: Diagnosis not present

## 2014-02-13 DIAGNOSIS — D631 Anemia in chronic kidney disease: Secondary | ICD-10-CM | POA: Diagnosis not present

## 2014-02-13 DIAGNOSIS — N2581 Secondary hyperparathyroidism of renal origin: Secondary | ICD-10-CM | POA: Diagnosis not present

## 2014-02-13 DIAGNOSIS — N186 End stage renal disease: Secondary | ICD-10-CM | POA: Diagnosis not present

## 2014-02-13 DIAGNOSIS — N039 Chronic nephritic syndrome with unspecified morphologic changes: Secondary | ICD-10-CM | POA: Diagnosis not present

## 2014-02-13 DIAGNOSIS — D509 Iron deficiency anemia, unspecified: Secondary | ICD-10-CM | POA: Diagnosis not present

## 2014-02-15 ENCOUNTER — Encounter: Payer: Self-pay | Admitting: Gastroenterology

## 2014-02-15 DIAGNOSIS — D509 Iron deficiency anemia, unspecified: Secondary | ICD-10-CM | POA: Diagnosis not present

## 2014-02-15 DIAGNOSIS — N186 End stage renal disease: Secondary | ICD-10-CM | POA: Diagnosis not present

## 2014-02-15 DIAGNOSIS — N039 Chronic nephritic syndrome with unspecified morphologic changes: Secondary | ICD-10-CM | POA: Diagnosis not present

## 2014-02-15 DIAGNOSIS — D631 Anemia in chronic kidney disease: Secondary | ICD-10-CM | POA: Diagnosis not present

## 2014-02-15 DIAGNOSIS — N2581 Secondary hyperparathyroidism of renal origin: Secondary | ICD-10-CM | POA: Diagnosis not present

## 2014-02-18 DIAGNOSIS — D631 Anemia in chronic kidney disease: Secondary | ICD-10-CM | POA: Diagnosis not present

## 2014-02-18 DIAGNOSIS — D509 Iron deficiency anemia, unspecified: Secondary | ICD-10-CM | POA: Diagnosis not present

## 2014-02-18 DIAGNOSIS — N186 End stage renal disease: Secondary | ICD-10-CM | POA: Diagnosis not present

## 2014-02-18 DIAGNOSIS — N2581 Secondary hyperparathyroidism of renal origin: Secondary | ICD-10-CM | POA: Diagnosis not present

## 2014-02-20 DIAGNOSIS — N2581 Secondary hyperparathyroidism of renal origin: Secondary | ICD-10-CM | POA: Diagnosis not present

## 2014-02-20 DIAGNOSIS — N186 End stage renal disease: Secondary | ICD-10-CM | POA: Diagnosis not present

## 2014-02-20 DIAGNOSIS — D631 Anemia in chronic kidney disease: Secondary | ICD-10-CM | POA: Diagnosis not present

## 2014-02-20 DIAGNOSIS — D509 Iron deficiency anemia, unspecified: Secondary | ICD-10-CM | POA: Diagnosis not present

## 2014-02-22 DIAGNOSIS — D631 Anemia in chronic kidney disease: Secondary | ICD-10-CM | POA: Diagnosis not present

## 2014-02-22 DIAGNOSIS — N2581 Secondary hyperparathyroidism of renal origin: Secondary | ICD-10-CM | POA: Diagnosis not present

## 2014-02-22 DIAGNOSIS — D509 Iron deficiency anemia, unspecified: Secondary | ICD-10-CM | POA: Diagnosis not present

## 2014-02-22 DIAGNOSIS — N186 End stage renal disease: Secondary | ICD-10-CM | POA: Diagnosis not present

## 2014-02-25 DIAGNOSIS — N039 Chronic nephritic syndrome with unspecified morphologic changes: Secondary | ICD-10-CM | POA: Diagnosis not present

## 2014-02-25 DIAGNOSIS — N186 End stage renal disease: Secondary | ICD-10-CM | POA: Diagnosis not present

## 2014-02-25 DIAGNOSIS — D509 Iron deficiency anemia, unspecified: Secondary | ICD-10-CM | POA: Diagnosis not present

## 2014-02-25 DIAGNOSIS — D631 Anemia in chronic kidney disease: Secondary | ICD-10-CM | POA: Diagnosis not present

## 2014-02-25 DIAGNOSIS — N2581 Secondary hyperparathyroidism of renal origin: Secondary | ICD-10-CM | POA: Diagnosis not present

## 2014-02-27 ENCOUNTER — Ambulatory Visit (HOSPITAL_COMMUNITY): Payer: Medicare Other

## 2014-02-27 ENCOUNTER — Observation Stay (HOSPITAL_COMMUNITY)
Admission: EM | Admit: 2014-02-27 | Discharge: 2014-02-28 | Disposition: A | Discharge status: Home / Self Care | Payer: Medicare Other | Attending: Cardiology | Admitting: Cardiology

## 2014-02-27 ENCOUNTER — Encounter (HOSPITAL_COMMUNITY): Payer: Self-pay | Admitting: Emergency Medicine

## 2014-02-27 DIAGNOSIS — I10 Essential (primary) hypertension: Secondary | ICD-10-CM | POA: Diagnosis not present

## 2014-02-27 DIAGNOSIS — K649 Unspecified hemorrhoids: Secondary | ICD-10-CM | POA: Diagnosis not present

## 2014-02-27 DIAGNOSIS — E875 Hyperkalemia: Secondary | ICD-10-CM | POA: Diagnosis not present

## 2014-02-27 DIAGNOSIS — Z8619 Personal history of other infectious and parasitic diseases: Secondary | ICD-10-CM | POA: Insufficient documentation

## 2014-02-27 DIAGNOSIS — I129 Hypertensive chronic kidney disease with stage 1 through stage 4 chronic kidney disease, or unspecified chronic kidney disease: Secondary | ICD-10-CM | POA: Insufficient documentation

## 2014-02-27 DIAGNOSIS — J811 Chronic pulmonary edema: Secondary | ICD-10-CM | POA: Diagnosis not present

## 2014-02-27 DIAGNOSIS — Z860100 Personal history of colon polyps, unspecified: Secondary | ICD-10-CM | POA: Insufficient documentation

## 2014-02-27 DIAGNOSIS — R51 Headache: Secondary | ICD-10-CM | POA: Diagnosis not present

## 2014-02-27 DIAGNOSIS — R0789 Other chest pain: Principal | ICD-10-CM | POA: Insufficient documentation

## 2014-02-27 DIAGNOSIS — D631 Anemia in chronic kidney disease: Secondary | ICD-10-CM | POA: Diagnosis not present

## 2014-02-27 DIAGNOSIS — N189 Chronic kidney disease, unspecified: Secondary | ICD-10-CM | POA: Insufficient documentation

## 2014-02-27 DIAGNOSIS — N2581 Secondary hyperparathyroidism of renal origin: Secondary | ICD-10-CM | POA: Diagnosis not present

## 2014-02-27 DIAGNOSIS — F17200 Nicotine dependence, unspecified, uncomplicated: Secondary | ICD-10-CM | POA: Insufficient documentation

## 2014-02-27 DIAGNOSIS — M948X9 Other specified disorders of cartilage, unspecified sites: Secondary | ICD-10-CM | POA: Diagnosis not present

## 2014-02-27 DIAGNOSIS — R9431 Abnormal electrocardiogram [ECG] [EKG]: Secondary | ICD-10-CM | POA: Diagnosis not present

## 2014-02-27 DIAGNOSIS — D649 Anemia, unspecified: Secondary | ICD-10-CM | POA: Diagnosis not present

## 2014-02-27 DIAGNOSIS — Z882 Allergy status to sulfonamides status: Secondary | ICD-10-CM | POA: Diagnosis not present

## 2014-02-27 DIAGNOSIS — R079 Chest pain, unspecified: Secondary | ICD-10-CM | POA: Diagnosis not present

## 2014-02-27 DIAGNOSIS — Z79899 Other long term (current) drug therapy: Secondary | ICD-10-CM | POA: Diagnosis not present

## 2014-02-27 DIAGNOSIS — N186 End stage renal disease: Secondary | ICD-10-CM | POA: Diagnosis not present

## 2014-02-27 DIAGNOSIS — I209 Angina pectoris, unspecified: Secondary | ICD-10-CM | POA: Diagnosis not present

## 2014-02-27 DIAGNOSIS — D509 Iron deficiency anemia, unspecified: Secondary | ICD-10-CM | POA: Diagnosis not present

## 2014-02-27 DIAGNOSIS — N289 Disorder of kidney and ureter, unspecified: Secondary | ICD-10-CM | POA: Diagnosis not present

## 2014-02-27 DIAGNOSIS — K219 Gastro-esophageal reflux disease without esophagitis: Secondary | ICD-10-CM | POA: Insufficient documentation

## 2014-02-27 DIAGNOSIS — Z888 Allergy status to other drugs, medicaments and biological substances status: Secondary | ICD-10-CM | POA: Insufficient documentation

## 2014-02-27 DIAGNOSIS — Z992 Dependence on renal dialysis: Secondary | ICD-10-CM | POA: Diagnosis not present

## 2014-02-27 DIAGNOSIS — Z886 Allergy status to analgesic agent status: Secondary | ICD-10-CM | POA: Diagnosis not present

## 2014-02-27 DIAGNOSIS — N039 Chronic nephritic syndrome with unspecified morphologic changes: Secondary | ICD-10-CM | POA: Diagnosis not present

## 2014-02-27 HISTORY — DX: Acute myocardial infarction, unspecified: I21.9

## 2014-02-27 LAB — COMPREHENSIVE METABOLIC PANEL
ALT: 24 U/L (ref 0–53)
AST: 36 U/L (ref 0–37)
Albumin: 3.7 g/dL (ref 3.5–5.2)
Alkaline Phosphatase: 94 U/L (ref 39–117)
Anion gap: 14 (ref 5–15)
BUN: 28 mg/dL — ABNORMAL HIGH (ref 6–23)
CO2: 32 mEq/L (ref 19–32)
Calcium: 10 mg/dL (ref 8.4–10.5)
Chloride: 91 mEq/L — ABNORMAL LOW (ref 96–112)
Creatinine, Ser: 7.06 mg/dL — ABNORMAL HIGH (ref 0.50–1.35)
GFR calc Af Amer: 9 mL/min — ABNORMAL LOW (ref >90)
GFR calc non Af Amer: 8 mL/min — ABNORMAL LOW (ref >90)
Glucose, Bld: 97 mg/dL (ref 70–99)
Potassium: 4 mEq/L (ref 3.7–5.3)
Sodium: 137 mEq/L (ref 137–147)
Total Bilirubin: 0.6 mg/dL (ref 0.3–1.2)
Total Protein: 8.7 g/dL — ABNORMAL HIGH (ref 6.0–8.3)

## 2014-02-27 LAB — I-STAT CHEM 8, ED
BUN: 29 mg/dL — ABNORMAL HIGH (ref 6–23)
Calcium, Ion: 1.06 mmol/L — ABNORMAL LOW (ref 1.12–1.23)
Chloride: 93 mEq/L — ABNORMAL LOW (ref 96–112)
Creatinine, Ser: 7.6 mg/dL — ABNORMAL HIGH (ref 0.50–1.35)
Glucose, Bld: 102 mg/dL — ABNORMAL HIGH (ref 70–99)
HCT: 36 % — ABNORMAL LOW (ref 39.0–52.0)
Hemoglobin: 12.2 g/dL — ABNORMAL LOW (ref 13.0–17.0)
Potassium: 3.9 mEq/L (ref 3.7–5.3)
Sodium: 136 mEq/L — ABNORMAL LOW (ref 137–147)
TCO2: 32 mmol/L (ref 0–100)

## 2014-02-27 LAB — PRO B NATRIURETIC PEPTIDE: Pro B Natriuretic peptide (BNP): 14545 pg/mL — ABNORMAL HIGH (ref 0–125)

## 2014-02-27 LAB — PROTIME-INR
INR: 1.01 (ref 0.00–1.49)
Prothrombin Time: 13.3 seconds (ref 11.6–15.2)

## 2014-02-27 LAB — I-STAT TROPONIN, ED: Troponin i, poc: 0.02 ng/mL (ref 0.00–0.08)

## 2014-02-27 LAB — APTT: aPTT: 35 seconds (ref 24–37)

## 2014-02-27 LAB — TROPONIN I: Troponin I: <0.3 ng/mL (ref <0.30)

## 2014-02-27 LAB — MAGNESIUM: Magnesium: 2.3 mg/dL (ref 1.5–2.5)

## 2014-02-27 MED ORDER — ONDANSETRON HCL 4 MG/2ML IJ SOLN: 4.0000 mg | Freq: Four times a day (QID) | INTRAMUSCULAR | Status: DC | PRN

## 2014-02-27 MED ORDER — ZOLPIDEM TARTRATE 5 MG PO TABS: 5.0000 mg | ORAL_TABLET | Freq: Every evening | ORAL | Status: DC | PRN

## 2014-02-27 MED ORDER — ACETAMINOPHEN 325 MG PO TABS: 650.0000 mg | ORAL_TABLET | ORAL | Status: DC | PRN

## 2014-02-27 MED ORDER — ALPRAZOLAM 0.5 MG PO TABS
1.0000 mg | ORAL_TABLET | Freq: Two times a day (BID) | ORAL | Status: DC | PRN
Start: 2014-02-27 — End: 2014-02-28

## 2014-02-27 MED ORDER — HEPARIN SODIUM (PORCINE) 5000 UNIT/ML IJ SOLN
5000.0000 [IU] | Freq: Three times a day (TID) | INTRAMUSCULAR | Status: DC
  Administered 2014-02-27 – 2014-02-28 (×2): 5000 [IU] via SUBCUTANEOUS
  Filled 2014-02-27 (×5): qty 1

## 2014-02-27 NOTE — ED Notes (Signed)
Pt having pain for 4-5 hours prior to calling EMS

## 2014-02-27 NOTE — ED Notes (Signed)
Code STEMI canceled at this time

## 2014-02-27 NOTE — ED Notes (Signed)
Cardiology at bedside.

## 2014-02-27 NOTE — H&P (Signed)
[name] is an 53 y.o. male.   Chief Complaint: Chest pain HPI: Patient is a 53 year old African American male with history of hypertension, hepatitis B, polysubstance abuse, patient admits to using cocaine about 3-4 days ago, difficult to control hypertension and end-stage renal disease on hemodialysis. He had dialysis this afternoon and while he was at home drinking beer, smoking cigarettes, he also use marijuana and started to feel chest discomfort. EMS was activated, his EKG was abnormal, STEMI was activated and patient was emergently transferred to the emergency room.  In the emergency room patient appeared very comfortable and states that he has not had any further chest pain. He did receive several nitroglycerin which he states may have helped, on further questioning he states he gets chest tightness anytime his blood pressure is elevated. He had no radiation of chest pain, no other associated symptoms except she had noticed shortness of breath.  He admits to continued tobacco use, marijuana use, cocaine use. He denies any symptoms the headache, visual disturbances, nausea or vomiting. Denies any bowel or bladder disturbances. No recent weight changes.  Past Medical History  Diagnosis Date  . Chronic kidney disease   . Hypertension   . Hepatitis B, chronic   . Hepatitis C   . Anemia   . Metabolic bone disease   . Renal disorder   . Angina     DATE UNKNOWN, C/O PERIODICALLY  . GERD (gastroesophageal reflux disease)     DATE UNKNOWN  . Headache(784.0)     OCCASIONALLY  . Dialysis patient     Monday-Wednesday-Friday  . Hyperkalemia   . Pulmonary edema   . Hemorrhoids   . Adenomatous colon polyp     tubular    Past Surgical History  Procedure Laterality Date  . Av fistula placement  2012    BELIEVED WAS PLACED IN JUNE    Family History  Problem Relation Age of Onset  . Heart disease Mother   . Lung cancer Mother   . Heart disease Father   . Malignant hyperthermia  Father   . Hypertension Other   . COPD Other   . Colon cancer Neg Hx   . Throat cancer Sister    Social History:  reports that he has been smoking Cigarettes.  He has a 18.5 pack-year smoking history. He has never used smokeless tobacco. He reports that he drinks about 2.3 ounces of alcohol per week. He reports that he uses illicit drugs (Marijuana and Cocaine).  Allergies:  Allergies  Allergen Reactions  . Aspirin   . Clonidine Derivatives Other (See Comments)    Clonidine patch causes stomach pain  . Tramadol Itching     (Not in a hospital admission)    Review of Systems - see history of present illness, other systems negative.  Blood pressure 204/85, pulse 90, temperature 98.5 F (36.9 C), temperature source Oral, resp. rate 18, height 5\' 11"  (1.803 m), weight 74.844 kg (165 lb), SpO2 100.00%. General appearance: alert, cooperative, appears stated age and no distress Eyes: negative findings: conjunctivae and sclerae normal Neck: no adenopathy, no carotid bruit, no JVD, supple, symmetrical, trachea midline and thyroid not enlarged, symmetric, no tenderness/mass/nodules Neck: JVP - normal, carotids 2+= without bruits Resp: clear to auscultation bilaterally Chest wall: no tenderness Cardio: regular rate and rhythm, S1, S2 normal, no murmur, click, rub or gallop GI: soft, non-tender; bowel sounds normal; no masses,  no organomegaly Extremities: extremities normal, atraumatic, no cyanosis or edema Pulses: 2+ and symmetric  Skin: Skin color, texture, turgor normal. No rashes or lesions Neurologic: Grossly normal  Results for orders placed during the hospital encounter of 02/27/14 (from the past 48 hour(s))  I-STAT CHEM 8, ED     Status: Abnormal   Collection Time    02/27/14 10:42 PM      Result Value Ref Range   Sodium 136 (*) 137 - 147 mEq/L   Potassium 3.9  3.7 - 5.3 mEq/L   Chloride 93 (*) 96 - 112 mEq/L   BUN 29 (*) 6 - 23 mg/dL   Creatinine, Ser 7.60 (*) 0.50 - 1.35  mg/dL   Glucose, Bld 102 (*) 70 - 99 mg/dL   Calcium, Ion 1.06 (*) 1.12 - 1.23 mmol/L   TCO2 32  0 - 100 mmol/L   Hemoglobin 12.2 (*) 13.0 - 17.0 g/dL   HCT 36.0 (*) 39.0 - 52.0 %   No results found.  Labs:   Lab Results  Component Value Date   WBC 12.4* 10/22/2013   HGB 12.2* 02/27/2014   HCT 36.0* 02/27/2014   MCV 82.4 10/22/2013   PLT 114* 10/22/2013    Recent Labs Lab 02/27/14 2242  NA 136*  K 3.9  CL 93*  BUN 29*  CREATININE 7.60*  GLUCOSE 102*   Lab Results  Component Value Date   CKTOTAL 274* 12/23/2010   CKMB 4.6* 12/23/2010   TROPONINI <0.30 12/23/2010    Lipid Panel     Component Value Date/Time   CHOL  Value: 164        ATP III CLASSIFICATION:  <200     mg/dL   Desirable  200-239  mg/dL   Borderline High  >=240    mg/dL   High        01/30/2010 0535   TRIG 268* 01/30/2010 0535   HDL 30* 01/30/2010 0535   CHOLHDL 5.5 01/30/2010 0535   VLDL 54* 01/30/2010 0535   LDLCALC  Value: 80        Total Cholesterol/HDL:CHD Risk Coronary Heart Disease Risk Table                     Men   Women  1/2 Average Risk   3.4   3.3  Average Risk       5.0   4.4  2 X Average Risk   9.6   7.1  3 X Average Risk  23.4   11.0        Use the calculated Patient Ratio above and the CHD Risk Table to determine the patient's CHD Risk.        ATP III CLASSIFICATION (LDL):  <100     mg/dL   Optimal  100-129  mg/dL   Near or Above                    Optimal  130-159  mg/dL   Borderline  160-189  mg/dL   High  >190     mg/dL   Very High 01/30/2010 0535    EKG: Normal sinus rhythm, left atrial enlargement, left hypertrophy with repolarization abnormality, cannot exclude lateral ischemia. No significant change from EKG done in 2010, compared to 2004, ST  depression in lateral leads new.   Assessment/Plan 1. Chest pain, atypical for angina pectoris and the patient was cut significant cardiovascular risk factors. 2. Hypertension with hypertensive heart disease, markedly abnormal EKG, no change from 2010 and  also 2004. 3. End-stage renal disease on hemodialysis 4.  Polysubstance use including cocaine and tobacco. Excessive on-call intake. Patient denies history of DTs in the past.  Recommendation: I have canceled the STEMI, we'll place him on observation, patient wanted to go home tonight, aggressive that given his risk factors he should stay to be ruled out for myocardial infarction and he can potentially discharge him home with outpatient followup and resuscitation. It is unfortunate that patient does not have much concern for his health.  Laverda Page, MD 02/27/2014, 11:10 PM [facility]. [place] Pager: [phone] Office: [phone] If no answer: Cell:  [phone]

## 2014-02-27 NOTE — ED Provider Notes (Signed)
CSN: [id number]     Arrival date & time 02/27/14  2224 History   None    Chief Complaint  Patient presents with  . Chest Pain     (Consider location/radiation/quality/duration/timing/severity/associated sxs/prior Treatment) HPI Comments: Patient here complaining of chest pain which started several hours prior to arrival. Symptoms started after patient smoked marijuana and drank alcohol. Had 3-4 hours of chest pain before he contacted EMS. Given nitroglycerin and pain to resolve. Pain was located substernally with radiation to his left arm. He has some increased dyspnea but no diaphoresis. Patient had his dialysis today with a full session. He has no prior history of coronary artery disease. He is a dialysis patient due 2 hypertension.  Patient is a 53 y.o. male presenting with chest pain. The history is provided by the patient and the EMS personnel.  Chest Pain   Past Medical History  Diagnosis Date  . Chronic kidney disease   . Hypertension   . Hepatitis B, chronic   . Hepatitis C   . Anemia   . Metabolic bone disease   . Renal disorder   . Angina     DATE UNKNOWN, C/O PERIODICALLY  . GERD (gastroesophageal reflux disease)     DATE UNKNOWN  . Headache(784.0)     OCCASIONALLY  . Dialysis patient     Monday-Wednesday-Friday  . Hyperkalemia   . Pulmonary edema   . Hemorrhoids   . Adenomatous colon polyp     tubular   Past Surgical History  Procedure Laterality Date  . Av fistula placement  2012    BELIEVED WAS PLACED IN JUNE   Family History  Problem Relation Age of Onset  . Heart disease Mother   . Lung cancer Mother   . Heart disease Father   . Malignant hyperthermia Father   . Hypertension Other   . COPD Other   . Colon cancer Neg Hx   . Throat cancer Sister    History  Substance Use Topics  . Smoking status: Current Every Day Smoker -- 0.50 packs/day for 37 years    Types: Cigarettes  . Smokeless tobacco: Never Used  . Alcohol Use: 2.3 oz/week    1  Glasses of wine, 1 Cans of beer, 1 Shots of liquor, 1 Drinks containing 0.5 oz of alcohol per week     Comment: frequently    Review of Systems  Cardiovascular: Positive for chest pain.  All other systems reviewed and are negative.     Allergies  Aspirin; Clonidine derivatives; and Tramadol  Home Medications   Prior to Admission medications   Medication Sig Start Date End Date Taking? Authorizing Provider  amLODipine (NORVASC) 10 MG tablet Take 10 mg by mouth 2 (two) times daily.     Historical Provider, MD  lisinopril (PRINIVIL,ZESTRIL) 20 MG tablet Take 20 mg by mouth daily.  09/07/12   Historical Provider, MD  minoxidil (LONITEN) 10 MG tablet Take 5 mg by mouth 2 (two) times daily.  07/17/13   Historical Provider, MD  pentafluoroprop-tetrafluoroeth Landry Dyke) AERO Apply 1 application topically as needed (topical anesthesia while in dialysis.). 07/16/11   Luvenia Heller, FNP  RENVELA 800 MG tablet Take 1,600-3,200 mg by mouth 3 (three) times daily with meals. Take 4 tablets (3200 mg) with each meal and 2 (1600 mg) tablets with snacks. 07/09/13   Historical Provider, MD   BP 204/85  Pulse 90  Temp(Src) 98.5 F (36.9 C) (Oral)  Resp 18  Ht 5\' 11"  (1.803 m)  Wt 165 lb (74.844 kg)  BMI 23.02 kg/m2  SpO2 100% Physical Exam  Nursing note and vitals reviewed. Constitutional: He is oriented to person, place, and time. He appears well-developed and well-nourished.  Non-toxic appearance. No distress.  HENT:  Head: Normocephalic and atraumatic.  Eyes: Conjunctivae, EOM and lids are normal. Pupils are equal, round, and reactive to light.  Neck: Normal range of motion. Neck supple. No tracheal deviation present. No mass present.  Cardiovascular: Normal rate, regular rhythm and normal heart sounds.  Exam reveals no gallop.   No murmur heard. Pulmonary/Chest: Effort normal and breath sounds normal. No stridor. No respiratory distress. He has no decreased breath sounds. He has no wheezes. He has  no rhonchi. He has no rales.  Abdominal: Soft. Normal appearance and bowel sounds are normal. He exhibits no distension. There is no tenderness. There is no rebound and no CVA tenderness.  Musculoskeletal: Normal range of motion. He exhibits no edema and no tenderness.  Neurological: He is alert and oriented to person, place, and time. He has normal strength. No cranial nerve deficit or sensory deficit. GCS eye subscore is 4. GCS verbal subscore is 5. GCS motor subscore is 6.  Skin: Skin is warm and dry. No abrasion and no rash noted.  Psychiatric: He has a normal mood and affect. His speech is normal and behavior is normal.    ED Course  Procedures (including critical care time) Labs Review Labs Reviewed  I-STAT CHEM 8, ED - Abnormal; Notable for the following:    Sodium 136 (*)    Chloride 93 (*)    BUN 29 (*)    Creatinine, Ser 7.60 (*)    Glucose, Bld 102 (*)    Calcium, Ion 1.06 (*)    Hemoglobin 12.2 (*)    HCT 36.0 (*)    All other components within normal limits  APTT  CBC  COMPREHENSIVE METABOLIC PANEL  PRO B NATRIURETIC PEPTIDE  MAGNESIUM  PROTIME-INR  TROPONIN I  I-STAT TROPOININ, ED    Imaging Review No results found.   EKG Interpretation None      MDM   Final diagnoses:  None     Date: 02/27/2014  Rate: 93  Rhythm: normal sinus rhythm  QRS Axis: normal  Intervals: normal  ST/T Wave abnormalities: nonspecific ST changes  Conduction Disutrbances:none  Narrative Interpretation:   Old EKG Reviewed: unchanged  Patient was called a code STEMI prior to arrival. Cardiology here to evaluate patient. EKG is unchanged from prior studies and close and he was canceled. Patient does admit to cocaine use in the last one to 2 days. Patient will be admitted to cardiology    Leota Jacobsen, MD 02/27/14 2251

## 2014-02-27 NOTE — ED Notes (Addendum)
Pt arrives via EMS with chest pain, code stemi called. Dialysis today. Chest pressure started at approx 1600 today, described as pressure. Began when smoking and drinking a beer, worse with lying down. 1 SL nitro given PTA. 18 G IV started in R arm. Allergic to aspirin

## 2014-02-28 ENCOUNTER — Encounter (HOSPITAL_COMMUNITY): Payer: Self-pay | Admitting: *Deleted

## 2014-02-28 DIAGNOSIS — N189 Chronic kidney disease, unspecified: Secondary | ICD-10-CM | POA: Diagnosis not present

## 2014-02-28 DIAGNOSIS — I10 Essential (primary) hypertension: Secondary | ICD-10-CM | POA: Diagnosis not present

## 2014-02-28 DIAGNOSIS — R9431 Abnormal electrocardiogram [ECG] [EKG]: Secondary | ICD-10-CM | POA: Diagnosis not present

## 2014-02-28 DIAGNOSIS — R079 Chest pain, unspecified: Secondary | ICD-10-CM | POA: Diagnosis not present

## 2014-02-28 DIAGNOSIS — I129 Hypertensive chronic kidney disease with stage 1 through stage 4 chronic kidney disease, or unspecified chronic kidney disease: Secondary | ICD-10-CM | POA: Diagnosis not present

## 2014-02-28 DIAGNOSIS — R0789 Other chest pain: Secondary | ICD-10-CM | POA: Diagnosis not present

## 2014-02-28 LAB — CBC
HCT: 31.9 % — ABNORMAL LOW (ref 39.0–52.0)
Hemoglobin: 11.2 g/dL — ABNORMAL LOW (ref 13.0–17.0)
MCH: 29.2 pg (ref 26.0–34.0)
MCHC: 35.1 g/dL (ref 30.0–36.0)
MCV: 83.1 fL (ref 78.0–100.0)
Platelets: 98 10*3/uL — ABNORMAL LOW (ref 150–400)
RBC: 3.84 MIL/uL — ABNORMAL LOW (ref 4.22–5.81)
RDW: 15.8 % — ABNORMAL HIGH (ref 11.5–15.5)
WBC: 5.5 10*3/uL (ref 4.0–10.5)

## 2014-02-28 LAB — TROPONIN I
Troponin I: <0.3 ng/mL (ref <0.30)
Troponin I: <0.3 ng/mL (ref <0.30)

## 2014-02-28 MED ORDER — HYDRALAZINE HCL 25 MG PO TABS: 25.0000 mg | ORAL_TABLET | Freq: Three times a day (TID) | ORAL | Status: DC

## 2014-02-28 MED ORDER — ALBUTEROL SULFATE (2.5 MG/3ML) 0.083% IN NEBU: 3.0000 mL | INHALATION_SOLUTION | RESPIRATORY_TRACT | Status: DC | PRN

## 2014-02-28 MED ORDER — HYDRALAZINE HCL 25 MG PO TABS
25.0000 mg | ORAL_TABLET | Freq: Three times a day (TID) | ORAL | Status: DC
  Filled 2014-02-28 (×4): qty 1

## 2014-02-28 MED ORDER — SEVELAMER CARBONATE 800 MG PO TABS
1600.0000 mg | ORAL_TABLET | Freq: Three times a day (TID) | ORAL | Status: DC | PRN
  Filled 2014-02-28: qty 2

## 2014-02-28 MED ORDER — SEVELAMER CARBONATE 800 MG PO TABS
3200.0000 mg | ORAL_TABLET | Freq: Three times a day (TID) | ORAL | Status: DC
  Filled 2014-02-28 (×4): qty 4

## 2014-02-28 MED ORDER — AMLODIPINE BESYLATE 10 MG PO TABS
10.0000 mg | ORAL_TABLET | Freq: Two times a day (BID) | ORAL | Status: DC
  Administered 2014-02-28: 10 mg via ORAL
  Filled 2014-02-28: qty 2
  Filled 2014-02-28 (×2): qty 1

## 2014-02-28 MED ORDER — LISINOPRIL 40 MG PO TABS
40.0000 mg | ORAL_TABLET | Freq: Every day | ORAL | Status: DC
  Filled 2014-02-28: qty 1

## 2014-02-28 MED ORDER — PENTAFLUOROPROP-TETRAFLUOROETH EX AERO: 1.0000 "application " | INHALATION_SPRAY | CUTANEOUS | Status: DC | PRN

## 2014-02-28 MED ORDER — MINOXIDIL 2.5 MG PO TABS
5.0000 mg | ORAL_TABLET | Freq: Two times a day (BID) | ORAL | Status: DC
  Administered 2014-02-28: 5 mg via ORAL
  Filled 2014-02-28 (×3): qty 2

## 2014-02-28 MED ORDER — CARVEDILOL 25 MG PO TABS
25.0000 mg | ORAL_TABLET | Freq: Two times a day (BID) | ORAL | Status: DC
Start: 2014-02-28 — End: 2014-02-28
  Filled 2014-02-28 (×3): qty 1

## 2014-02-28 MED ORDER — ALBUTEROL SULFATE HFA 108 (90 BASE) MCG/ACT IN AERS: 2.0000 | INHALATION_SPRAY | RESPIRATORY_TRACT | Status: DC | PRN

## 2014-02-28 NOTE — Discharge Instructions (Signed)
Chest Pain (Nonspecific) °It is often hard to give a diagnosis for the cause of chest pain. There is always a chance that your pain could be related to something serious, such as a heart attack or a blood clot in the lungs. You need to follow up with your doctor. °HOME CARE °· If antibiotic medicine was given, take it as directed by your doctor. Finish the medicine even if you start to feel better. °· For the next few days, avoid activities that bring on chest pain. Continue physical activities as told by your doctor. °· Do not use any tobacco products. This includes cigarettes, chewing tobacco, and e-cigarettes. °· Avoid drinking alcohol. °· Only take medicine as told by your doctor. °· Follow your doctor's suggestions for more testing if your chest pain does not go away. °· Keep all doctor visits you made. °GET HELP IF: °· Your chest pain does not go away, even after treatment. °· You have a rash with blisters on your chest. °· You have a fever. °GET HELP RIGHT AWAY IF:  °· You have more pain or pain that spreads to your arm, neck, jaw, back, or belly (abdomen). °· You have shortness of breath. °· You cough more than usual or cough up blood. °· You have very bad back or belly pain. °· You feel sick to your stomach (nauseous) or throw up (vomit). °· You have very bad weakness. °· You pass out (faint). °· You have chills. °This is an emergency. Do not wait to see if the problems will go away. Call your local emergency services (911 in [place] Do not drive yourself to the hospital. °MAKE SURE YOU:  °· Understand these instructions. °· Will watch your condition. °· Will get help right away if you are not doing well or get worse. °Document Released: 12/15/2007 Document Revised: 07/03/2013 Document Reviewed: 12/15/2007 °[employer]® Patient Information ©2015 [employer]. This information is not intended to replace advice given to you by your health care provider. Make sure you discuss any questions you have with your  health care provider. ° °

## 2014-02-28 NOTE — Progress Notes (Signed)
Patient has home medications at bedside.  Nurse explained to patient that the medications were suppose to be sent to pharmacy for safe keeping and they would be returned at the time of his discharge from the hospital.  Patient refused to let nurse send medications to pharmacy.  Patient stated, "I am responsible for my own belongings while I am here, I am keeping my medications with me."  Nurse explained to patient that he was not allowed to take any of his home medications while he was here at the hospital.  Any medication needed would come from the nurse.  Patient stated understanding and stated he would not take any of his home medications while at the hospital.

## 2014-02-28 NOTE — Plan of Care (Signed)
Problem: Phase I Progression Outcomes Goal: Aspirin unless contraindicated Outcome: Not Met (add Reason) Patient has allergy to aspirin per patient report

## 2014-02-28 NOTE — Discharge Summary (Signed)
Physician Discharge Summary  Patient ID: THONG RASPA MRN: [id number] DOB/AGE: 1960-11-30 53 y.o.  Admit date: 02/27/2014 Discharge date: 02/28/2014  Primary Discharge Diagnosis 1. Chest pain probably due to hypertensive heart disease. Cannot r/o CAD 2. Hypertensive urgency, admission blood pressure 204/85 mmHg with chest pain.  Secondary Discharge Diagnosis 1. Abnormal EKG :  02/27/2014: Normal sinus rhythm, left atrial enlargement, left hypertrophy with repolarization abnormality, cannot exclude lateral ischemia. No significant change from EKG done in 2010, compared to 2004, ST depression in lateral leads new.  2. Hypertension with hypertensive heart disease, markedly abnormal EKG, no change from 2010 and also 2004.  3. End-stage renal disease on hemodialysis  4. Polysubstance use including cocaine and tobacco. Excessive on-call intake. Patient denies history of DTs in the past. 5. Chronic Hepatitis B  Hospital Course:  Patient is a 53 year old African American male with history of hypertension, hepatitis B, polysubstance abuse, patient admits to using cocaine about 3-4 days ago, difficult to control hypertension and end-stage renal disease on hemodialysis. He had dialysis this afternoon and while he was at home drinking beer, smoking cigarettes, he also use marijuana and started to feel chest discomfort. EMS was activated, his EKG was abnormal, STEMI was activated and patient was emergently transferred to the emergency room.  In the emergency room patient appeared very comfortable and states that he has not had any further chest pain. He did receive several nitroglycerin which he states may have helped, on further questioning he states he gets chest tightness anytime his blood pressure is elevated. He had no radiation of chest pain, no other associated symptoms except she had noticed shortness of breath.  As he was chest pain-free, comparison with the prior EKG with no significant change, his  cardiac troponins are negative, code STEMI was canceled, he was ruled out for myocardial infarction, patient wanted to be discharged home the same time in the emergency room, but decided to stay for ruling out myocardial infarction. The following morning he remained asymptomatic, hence I made arrangements for him to have outpatient cardiac workup and I will see him back in the office following the stress test and echocardiogram.  I've discussed with him regarding the effects of cocaine, advised him that even several days after using cocaine, he can potentially have coronary spasm and myocardial infarction. Hopefully he will abstain from cocaine use and tobacco use.  I also started him on hydralazine 25 mg by mouth 3 times a day, his blood pressure was well controlled with the present medication dose and regimen. I will certainly keep Dr. Precious Bard informed about this hospital course.  Recommendations on discharge: Outpatient echocardiogram and stress test, new medication hydralazine 25 mg by mouth 3 times a day for blood pressure control.  Discharge Exam: Blood pressure 170/83, pulse 78, temperature 98.8 F (37.1 C), temperature source Oral, resp. rate 16, height 5\' 11"  (1.803 m), weight 70.534 kg (155 lb 8 oz), SpO2 97.00%.   General appearance: alert, cooperative, appears stated age and no distress  Eyes: negative findings: conjunctivae and sclerae normal  Neck: no adenopathy, no carotid bruit, no JVD, supple, symmetrical, trachea midline and thyroid not enlarged, symmetric, no tenderness/mass/nodules  Neck: JVP - normal, carotids 2+= without bruits  Resp: clear to auscultation bilaterally  Chest wall: no tenderness  Cardio: regular rate and rhythm, S1, S2 normal, no murmur, click, rub or gallop  GI: soft, non-tender; bowel sounds normal; no masses, no organomegaly  Extremities: extremities normal, atraumatic, no cyanosis or edema  Pulses: 2+ and symmetric  Skin: Skin color, texture,  turgor normal. No rashes or lesions  Neurologic: Grossly normal  Labs:   Lab Results  Component Value Date   WBC 5.5 02/27/2014   HGB 12.2* 02/27/2014   HCT 36.0* 02/27/2014   MCV 83.1 02/27/2014   PLT 98* 02/27/2014    Recent Labs Lab 02/27/14 2234 02/27/14 2242  NA 137 136*  K 4.0 3.9  CL 91* 93*  CO2 32  --   BUN 28* 29*  CREATININE 7.06* 7.60*  CALCIUM 10.0  --   PROT 8.7*  --   BILITOT 0.6  --   ALKPHOS 94  --   ALT 24  --   AST 36  --   GLUCOSE 97 102*   Lab Results  Component Value Date   CKTOTAL 274* 12/23/2010   CKMB 4.6* 12/23/2010   TROPONINI <0.30 02/28/2014    Lipid Panel     Component Value Date/Time   CHOL  Value: 164        ATP III CLASSIFICATION:  <200     mg/dL   Desirable  200-239  mg/dL   Borderline High  >=240    mg/dL   High        01/30/2010 0535   TRIG 268* 01/30/2010 0535   HDL 30* 01/30/2010 0535   CHOLHDL 5.5 01/30/2010 0535   VLDL 54* 01/30/2010 0535   LDLCALC  Value: 80        Total Cholesterol/HDL:CHD Risk Coronary Heart Disease Risk Table                     Men   Women  1/2 Average Risk   3.4   3.3  Average Risk       5.0   4.4  2 X Average Risk   9.6   7.1  3 X Average Risk  23.4   11.0        Use the calculated Patient Ratio above and the CHD Risk Table to determine the patient's CHD Risk.        ATP III CLASSIFICATION (LDL):  <100     mg/dL   Optimal  100-129  mg/dL   Near or Above                    Optimal  130-159  mg/dL   Borderline  160-189  mg/dL   High  >190     mg/dL   Very High 01/30/2010 0535     Radiology: Dg Chest Portable 1 View  02/27/2014   CLINICAL DATA:  Chest pain.  EXAM: PORTABLE CHEST - 1 VIEW  COMPARISON:  10/22/2013.  FINDINGS: The heart is mildly enlarged but stable. There is mild tortuosity of the thoracic aorta. The lungs are clear. Dense carotid artery calcifications are noted bilaterally.  IMPRESSION: No acute pulmonary findings.   Electronically Signed   By: Kalman Jewels M.D.   On: 02/27/2014 23:19    FOLLOW UP  PLANS AND APPOINTMENTS    Medication List         albuterol 108 (90 BASE) MCG/ACT inhaler  Commonly known as:  PROVENTIL HFA;VENTOLIN HFA  Inhale 2 puffs into the lungs every 4 (four) hours as needed for wheezing or shortness of breath.     amLODipine 10 MG tablet  Commonly known as:  NORVASC  Take 10 mg by mouth 2 (two) times daily.     carvedilol 25 MG tablet  Commonly known as:  COREG  Take 25 mg by mouth 2 (two) times daily with a meal.     hydrALAZINE 25 MG tablet  Commonly known as:  APRESOLINE  Take 1 tablet (25 mg total) by mouth every 8 (eight) hours.     lisinopril 20 MG tablet  Commonly known as:  PRINIVIL,ZESTRIL  Take 40 mg by mouth daily.     minoxidil 10 MG tablet  Commonly known as:  LONITEN  Take 5 mg by mouth 2 (two) times daily.     multivitamin Tabs tablet  Take 1 tablet by mouth daily.     pentafluoroprop-tetrafluoroeth Aero  Commonly known as:  GEBAUERS  Apply 1 application topically as needed (topical anesthesia while in dialysis.).     RENVELA 800 MG tablet  Generic drug:  sevelamer carbonate  Take 1,600-3,200 mg by mouth 3 (three) times daily with meals. Take 4 tablets (3200 mg) with each meal and 2 (1600 mg) tablets with snacks.           Follow-up Information   Follow up with Laverda Page, MD. (Echocardiogram on 03/20/2014 at 2:30 pm and stress test on Monday 03/04/2014 at 10:15)    Specialty:  Cardiology   Contact information:   [place]  [address] [phone]        Laverda Page, MD 02/28/2014, 10:08 AM  Pager: [phone] Office: [phone] If no answer: [phone]

## 2014-03-01 DIAGNOSIS — N039 Chronic nephritic syndrome with unspecified morphologic changes: Secondary | ICD-10-CM | POA: Diagnosis not present

## 2014-03-01 DIAGNOSIS — N186 End stage renal disease: Secondary | ICD-10-CM | POA: Diagnosis not present

## 2014-03-01 DIAGNOSIS — N2581 Secondary hyperparathyroidism of renal origin: Secondary | ICD-10-CM | POA: Diagnosis not present

## 2014-03-01 DIAGNOSIS — D509 Iron deficiency anemia, unspecified: Secondary | ICD-10-CM | POA: Diagnosis not present

## 2014-03-01 DIAGNOSIS — D631 Anemia in chronic kidney disease: Secondary | ICD-10-CM | POA: Diagnosis not present

## 2014-03-04 DIAGNOSIS — R9431 Abnormal electrocardiogram [ECG] [EKG]: Secondary | ICD-10-CM | POA: Diagnosis not present

## 2014-03-04 DIAGNOSIS — R079 Chest pain, unspecified: Secondary | ICD-10-CM | POA: Diagnosis not present

## 2014-03-04 DIAGNOSIS — D631 Anemia in chronic kidney disease: Secondary | ICD-10-CM | POA: Diagnosis not present

## 2014-03-04 DIAGNOSIS — D509 Iron deficiency anemia, unspecified: Secondary | ICD-10-CM | POA: Diagnosis not present

## 2014-03-04 DIAGNOSIS — N2581 Secondary hyperparathyroidism of renal origin: Secondary | ICD-10-CM | POA: Diagnosis not present

## 2014-03-04 DIAGNOSIS — N186 End stage renal disease: Secondary | ICD-10-CM | POA: Diagnosis not present

## 2014-03-05 ENCOUNTER — Encounter: Payer: Self-pay | Admitting: Internal Medicine

## 2014-03-05 ENCOUNTER — Ambulatory Visit (INDEPENDENT_AMBULATORY_CARE_PROVIDER_SITE_OTHER): Payer: Medicare Other | Admitting: Internal Medicine

## 2014-03-05 ENCOUNTER — Other Ambulatory Visit: Payer: Medicare Other

## 2014-03-05 VITALS — BP 130/62 | HR 72 | Ht 71.0 in | Wt 157.8 lb

## 2014-03-05 DIAGNOSIS — B182 Chronic viral hepatitis C: Secondary | ICD-10-CM

## 2014-03-05 DIAGNOSIS — B181 Chronic viral hepatitis B without delta-agent: Secondary | ICD-10-CM | POA: Diagnosis not present

## 2014-03-05 DIAGNOSIS — K648 Other hemorrhoids: Secondary | ICD-10-CM

## 2014-03-05 DIAGNOSIS — F1010 Alcohol abuse, uncomplicated: Secondary | ICD-10-CM

## 2014-03-05 DIAGNOSIS — D696 Thrombocytopenia, unspecified: Secondary | ICD-10-CM

## 2014-03-05 MED ORDER — POLYETHYLENE GLYCOL 3350 17 GM/SCOOP PO POWD: 17.0000 g | Freq: Every day | ORAL | Status: DC

## 2014-03-05 NOTE — Assessment & Plan Note (Addendum)
Grade 1 on anoscopy PLTs low but no signs of portal htn elsewhere (do not see rectal varices here and none on colonoscopy last year) 1 band placed today  (RA)to help alleviate sxs and willd ecide about further banding depending upon further liver w/u with US/elastography RTC 2-4 weeks

## 2014-03-05 NOTE — Assessment & Plan Note (Signed)
Check RNA level and US/elastography

## 2014-03-05 NOTE — Assessment & Plan Note (Signed)
+   S Ag and core Ab Check HBV DNA to see if really chronic HBV

## 2014-03-05 NOTE — Patient Instructions (Addendum)
You have been given a separate informational sheet regarding your tobacco use, the importance of quitting and local resources to help you quit.  We have sent the following medications to your pharmacy for you to pick up at your convenience: Miralax, coupon provided  Your physician has requested that you go to the basement for lab work before leaving today.   You may use dulcolax if you need it for constipation.  Reduce your alcohol intake and try to stop all intake.  You have been scheduled for an abdominal ultrasound at [facility] on 03/12/14 at 11:00am. Please arrive 15 minutes prior to your appointment for registration. Make certain not to have anything to eat or drink 6 hours prior to your appointment. Should you need to reschedule your appointment, please contact radiology at [phone]. This test typically takes about 30 minutes to perform.  We will see you at your next appointment :  04/09/14 at 10:15AM  [place]   1. The procedure you have had should have been relatively painless since the banding of the area involved does not have nerve endings and there is no pain sensation.  The rubber band cuts off the blood supply to the hemorrhoid and the band may fall off as soon as 48 hours after the banding (the band may occasionally be seen in the toilet bowl following a bowel movement). You may notice a temporary feeling of fullness in the rectum which should respond adequately to plain Tylenol or Motrin.  2. Following the banding, avoid strenuous exercise that evening and resume full activity the next day.  A sitz bath (soaking in a warm tub) or bidet is soothing, and can be useful for cleansing the area after bowel movements.     3. To avoid constipation, take two tablespoons of natural wheat bran, natural oat bran, flax, Benefiber or any over the counter fiber supplement and increase your water intake to 7-8 glasses daily.    4. Unless you  have been prescribed anorectal medication, do not put anything inside your rectum for two weeks: No suppositories, enemas, fingers, etc.  5. Occasionally, you may have more bleeding than usual after the banding procedure.  This is often from the untreated hemorrhoids rather than the treated one.  Don't be concerned if there is a tablespoon or so of blood.  If there is more blood than this, lie flat with your bottom higher than your head and apply an ice pack to the area. If the bleeding does not stop within a half an hour or if you feel faint, call our office at [phone] or go to the emergency room.  6. Problems are not common; however, if there is a substantial amount of bleeding, severe pain, chills, fever or difficulty passing urine (very rare) or other problems, you should call us at [phone] or report to the nearest emergency room.  7. Do not stay seated continuously for more than 2-3 hours for a day or two after the procedure.  Tighten your buttock muscles 10-15 times every two hours and take 10-15 deep breaths every 1-2 hours.  Do not spend more than a few minutes on the toilet if you cannot empty your bowel; instead re-visit the toilet at a later time.    I appreciate the opportunity to care for you.

## 2014-03-05 NOTE — Assessment & Plan Note (Signed)
Counseled to stop and as to why - early death, etc

## 2014-03-05 NOTE — Progress Notes (Signed)
Subjective:    Patient ID: [name], male    DOB: 1960/11/08, 53 y.o.   MRN: [id number]  HPI He c/o intermittent anal itching, swelling and bright red blood. Hx hemorrhoids on colonoscopy 2014. Strains to stool sometimes, moves bowels qd usually - thinks iron at HD constipates Uses citrate og Mg rarely - is aware of MG toxicity risk Tried MiraLax at one point but 1-2 doses only Drinks EtOH daily 'as much as I can get" Also hx using cocaine and smoking tobacco, marijuana No hx cirrhosis but PLTs low HCV amd HBV +  Admitted to hospital w/ CP earlier in Aug - hypertensive urgency - no MI  Allergies  Allergen Reactions  . Aspirin   . Clonidine Derivatives Other (See Comments)    Clonidine patch causes stomach pain  . Tramadol Itching   Outpatient Prescriptions Prior to Visit  Medication Sig Dispense Refill  . albuterol (PROVENTIL HFA;VENTOLIN HFA) 108 (90 BASE) MCG/ACT inhaler Inhale 2 puffs into the lungs every 4 (four) hours as needed for wheezing or shortness of breath.      Marland Kitchen amLODipine (NORVASC) 10 MG tablet Take 10 mg by mouth 2 (two) times daily.       . carvedilol (COREG) 25 MG tablet Take 25 mg by mouth 2 (two) times daily with a meal.      . hydrALAZINE (APRESOLINE) 25 MG tablet Take 1 tablet (25 mg total) by mouth every 8 (eight) hours.  90 tablet  1  . lisinopril (PRINIVIL,ZESTRIL) 20 MG tablet Take 40 mg by mouth daily.       . minoxidil (LONITEN) 10 MG tablet Take 5 mg by mouth 2 (two) times daily.       . multivitamin (RENA-VIT) TABS tablet Take 1 tablet by mouth daily.      . pentafluoroprop-tetrafluoroeth (GEBAUERS) AERO Apply 1 application topically as needed (topical anesthesia while in dialysis.).  1 mL  6  . RENVELA 800 MG tablet Take 1,600-3,200 mg by mouth 3 (three) times daily with meals. Take 4 tablets (3200 mg) with each meal and 2 (1600 mg) tablets with snacks.       No facility-administered medications prior to visit.   Past Medical History    Diagnosis Date  . Chronic kidney disease   . Hypertension   . Hepatitis B, chronic   . Hepatitis C   . Anemia   . Metabolic bone disease   . Renal disorder   . Chest pain     DATE UNKNOWN, C/O PERIODICALLY  . GERD (gastroesophageal reflux disease)     DATE UNKNOWN  . Headache(784.0)     OCCASIONALLY  . Dialysis patient     Monday-Wednesday-Friday  . Hyperkalemia   . Pulmonary edema   . Hemorrhoids   . Adenomatous colon polyp     tubular  . Myocardial infarction    Past Surgical History  Procedure Laterality Date  . Av fistula placement  2012    BELIEVED WAS PLACED IN JUNE   History   Social History  . Marital Status: Single   Occupational History  . Unemployed    Social History Main Topics  . Smoking status: Current Every Day Smoker -- 0.50 packs/day for 37 years    Types: Cigarettes  . Smokeless tobacco: Never Used  . Alcohol Use: 2.3 oz/week    1 Glasses of wine, 1 Cans of beer, 1 Shots of liquor, 1 Drinks containing 0.5 oz of alcohol per week  Comment: frequently  . Drug Use: Yes    Special: Marijuana, Cocaine     Comment: once month smoke marijuana  . Sexual Activity: None     Comment: "NOT LATELY"    Family History  Problem Relation Age of Onset  . Heart disease Mother   . Lung cancer Mother   . Heart disease Father   . Malignant hyperthermia Father   . Hypertension Other   . COPD Other   . Colon cancer Neg Hx   . Throat cancer Sister         Review of Systems As per HPI    Objective:   Physical Exam Thin BM NAD Eyes are anicteric abd thin and soft, NT Ext: No LE edema Neuro: A and O x 3  Rectal:  Anoderm inspection revealed very small anal tags Anal wink was + Digital exam revealed normal resting tone and voluntary squeeze. No mass or rectocele present. NL Prostate Simulated defecation with valsalva revealed appropriate abdominal contraction and descent.     Anoscopy was performed with the patient in the left lateral  decubitus position while a chaperone was present and revealed Grade 1 internal hemorrhoids RA>LL>RP  Lab Results  Component Value Date   WBC 5.5 02/27/2014   HGB 12.2* 02/27/2014   HCT 36.0* 02/27/2014   MCV 83.1 02/27/2014   PLT 98* 02/27/2014     Chemistry      Component Value Date/Time   NA 136* 02/27/2014 2242   K 3.9 02/27/2014 2242   CL 93* 02/27/2014 2242   CO2 32 02/27/2014 2234   BUN 29* 02/27/2014 2242   CREATININE 7.60* 02/27/2014 2242      Component Value Date/Time   CALCIUM 10.0 02/27/2014 2234   CALCIUM 8.1* 07/11/2011 1117   ALKPHOS 94 02/27/2014 2234   AST 36 02/27/2014 2234   ALT 24 02/27/2014 2234   BILITOT 0.6 02/27/2014 2234     Lab Results  Component Value Date   INR 1.01 02/27/2014   INR 0.95 12/22/2010     PROCEDURE NOTE: The patient presents with symptomatic grade 1  hemorrhoids, requesting rubber band ligation of his/her hemorrhoidal disease.  All risks, benefits and alternative forms of therapy were described and informed consent was obtained.   The anorectum was pre-medicated with 5% lidocaine and 0.125% NTG  The decision was made to band the RA internal hemorrhoid, and the [place] was used to perform band ligation without complication.  Digital anorectal examination was then performed to assure proper positioning of the band, and to adjust the banded tissue as required.  The patient was discharged home without pain or other issues.  Dietary and behavioral recommendations were given and along with follow-up instructions.     The following adjunctive treatments were recommended:  Daily MiraLAx, prn bisacodyl for extreme constipation  The patient will return in 2-4 weeks for  follow-up and possible additional banding as required. No complications were encountered and the patient tolerated the procedure well.      Assessment & Plan:  Internal hemorrhoids with bleeding, swelling and itching Grade 1 on anoscopy PLTs low but no signs of portal  htn elsewhere (do not see rectal varices here and none on colonoscopy last year) 1 band placed today  (RA)to help alleviate sxs and willd ecide about further banding depending upon further liver w/u with US/elastography RTC 2-4 weeks  ALCOHOL ABUSE Counseled to stop and as to why - early death, etc  Chronic hepatitis B +  S Ag and core Ab Check HBV DNA to see if really chronic HBV  Chronic hepatitis C without hepatic coma Check RNA level and US/elastography   [id number], MD [facility]

## 2014-03-06 DIAGNOSIS — D631 Anemia in chronic kidney disease: Secondary | ICD-10-CM | POA: Diagnosis not present

## 2014-03-06 DIAGNOSIS — N2581 Secondary hyperparathyroidism of renal origin: Secondary | ICD-10-CM | POA: Diagnosis not present

## 2014-03-06 DIAGNOSIS — N039 Chronic nephritic syndrome with unspecified morphologic changes: Secondary | ICD-10-CM | POA: Diagnosis not present

## 2014-03-06 DIAGNOSIS — N186 End stage renal disease: Secondary | ICD-10-CM | POA: Diagnosis not present

## 2014-03-06 DIAGNOSIS — D509 Iron deficiency anemia, unspecified: Secondary | ICD-10-CM | POA: Diagnosis not present

## 2014-03-06 LAB — HEPATITIS C RNA QUANTITATIVE
HCV Quantitative Log: 6.46 {Log} — ABNORMAL HIGH (ref <1.18)
HCV Quantitative: 2907497 IU/mL — ABNORMAL HIGH (ref <15)

## 2014-03-08 DIAGNOSIS — N2581 Secondary hyperparathyroidism of renal origin: Secondary | ICD-10-CM | POA: Diagnosis not present

## 2014-03-08 DIAGNOSIS — D631 Anemia in chronic kidney disease: Secondary | ICD-10-CM | POA: Diagnosis not present

## 2014-03-08 DIAGNOSIS — N186 End stage renal disease: Secondary | ICD-10-CM | POA: Diagnosis not present

## 2014-03-08 DIAGNOSIS — N039 Chronic nephritic syndrome with unspecified morphologic changes: Secondary | ICD-10-CM | POA: Diagnosis not present

## 2014-03-08 DIAGNOSIS — D509 Iron deficiency anemia, unspecified: Secondary | ICD-10-CM | POA: Diagnosis not present

## 2014-03-08 LAB — HEPATITIS B DNA, ULTRAQUANTITATIVE, PCR: Hepatitis B DNA: NOT DETECTED IU/mL (ref <20)

## 2014-03-11 DIAGNOSIS — N039 Chronic nephritic syndrome with unspecified morphologic changes: Secondary | ICD-10-CM | POA: Diagnosis not present

## 2014-03-11 DIAGNOSIS — N2581 Secondary hyperparathyroidism of renal origin: Secondary | ICD-10-CM | POA: Diagnosis not present

## 2014-03-11 DIAGNOSIS — N186 End stage renal disease: Secondary | ICD-10-CM | POA: Diagnosis not present

## 2014-03-11 DIAGNOSIS — D631 Anemia in chronic kidney disease: Secondary | ICD-10-CM | POA: Diagnosis not present

## 2014-03-11 DIAGNOSIS — D509 Iron deficiency anemia, unspecified: Secondary | ICD-10-CM | POA: Diagnosis not present

## 2014-03-12 ENCOUNTER — Ambulatory Visit (HOSPITAL_COMMUNITY): Payer: Medicare Other

## 2014-03-13 DIAGNOSIS — D509 Iron deficiency anemia, unspecified: Secondary | ICD-10-CM | POA: Diagnosis not present

## 2014-03-13 DIAGNOSIS — N2581 Secondary hyperparathyroidism of renal origin: Secondary | ICD-10-CM | POA: Diagnosis not present

## 2014-03-13 DIAGNOSIS — N186 End stage renal disease: Secondary | ICD-10-CM | POA: Diagnosis not present

## 2014-03-13 DIAGNOSIS — N039 Chronic nephritic syndrome with unspecified morphologic changes: Secondary | ICD-10-CM | POA: Diagnosis not present

## 2014-03-13 DIAGNOSIS — D631 Anemia in chronic kidney disease: Secondary | ICD-10-CM | POA: Diagnosis not present

## 2014-03-15 DIAGNOSIS — N2581 Secondary hyperparathyroidism of renal origin: Secondary | ICD-10-CM | POA: Diagnosis not present

## 2014-03-15 DIAGNOSIS — D631 Anemia in chronic kidney disease: Secondary | ICD-10-CM | POA: Diagnosis not present

## 2014-03-15 DIAGNOSIS — N186 End stage renal disease: Secondary | ICD-10-CM | POA: Diagnosis not present

## 2014-03-15 DIAGNOSIS — D509 Iron deficiency anemia, unspecified: Secondary | ICD-10-CM | POA: Diagnosis not present

## 2014-03-15 NOTE — Progress Notes (Signed)
Quick Note:  + HCV Not + HBV Needs to get the Abd [place] with elastography we ordered ______

## 2014-03-18 DIAGNOSIS — D631 Anemia in chronic kidney disease: Secondary | ICD-10-CM | POA: Diagnosis not present

## 2014-03-18 DIAGNOSIS — N186 End stage renal disease: Secondary | ICD-10-CM | POA: Diagnosis not present

## 2014-03-18 DIAGNOSIS — N2581 Secondary hyperparathyroidism of renal origin: Secondary | ICD-10-CM | POA: Diagnosis not present

## 2014-03-18 DIAGNOSIS — D509 Iron deficiency anemia, unspecified: Secondary | ICD-10-CM | POA: Diagnosis not present

## 2014-03-18 DIAGNOSIS — N039 Chronic nephritic syndrome with unspecified morphologic changes: Secondary | ICD-10-CM | POA: Diagnosis not present

## 2014-03-20 ENCOUNTER — Telehealth: Payer: Self-pay | Admitting: Internal Medicine

## 2014-03-20 DIAGNOSIS — N2581 Secondary hyperparathyroidism of renal origin: Secondary | ICD-10-CM | POA: Diagnosis not present

## 2014-03-20 DIAGNOSIS — N186 End stage renal disease: Secondary | ICD-10-CM | POA: Diagnosis not present

## 2014-03-20 DIAGNOSIS — D509 Iron deficiency anemia, unspecified: Secondary | ICD-10-CM | POA: Diagnosis not present

## 2014-03-20 DIAGNOSIS — D631 Anemia in chronic kidney disease: Secondary | ICD-10-CM | POA: Diagnosis not present

## 2014-03-20 NOTE — Telephone Encounter (Signed)
Patient notified that will need to keep appt for hemorrhoidal banding #2 as scheduled on 04/09/14

## 2014-03-21 ENCOUNTER — Ambulatory Visit (HOSPITAL_COMMUNITY): Payer: Medicare Other

## 2014-03-21 DIAGNOSIS — R079 Chest pain, unspecified: Secondary | ICD-10-CM | POA: Diagnosis not present

## 2014-03-21 DIAGNOSIS — R9431 Abnormal electrocardiogram [ECG] [EKG]: Secondary | ICD-10-CM | POA: Diagnosis not present

## 2014-03-22 DIAGNOSIS — D509 Iron deficiency anemia, unspecified: Secondary | ICD-10-CM | POA: Diagnosis not present

## 2014-03-22 DIAGNOSIS — D631 Anemia in chronic kidney disease: Secondary | ICD-10-CM | POA: Diagnosis not present

## 2014-03-22 DIAGNOSIS — N186 End stage renal disease: Secondary | ICD-10-CM | POA: Diagnosis not present

## 2014-03-22 DIAGNOSIS — N2581 Secondary hyperparathyroidism of renal origin: Secondary | ICD-10-CM | POA: Diagnosis not present

## 2014-03-25 DIAGNOSIS — D631 Anemia in chronic kidney disease: Secondary | ICD-10-CM | POA: Diagnosis not present

## 2014-03-25 DIAGNOSIS — N2581 Secondary hyperparathyroidism of renal origin: Secondary | ICD-10-CM | POA: Diagnosis not present

## 2014-03-25 DIAGNOSIS — N186 End stage renal disease: Secondary | ICD-10-CM | POA: Diagnosis not present

## 2014-03-25 DIAGNOSIS — D509 Iron deficiency anemia, unspecified: Secondary | ICD-10-CM | POA: Diagnosis not present

## 2014-03-27 DIAGNOSIS — D509 Iron deficiency anemia, unspecified: Secondary | ICD-10-CM | POA: Diagnosis not present

## 2014-03-27 DIAGNOSIS — D631 Anemia in chronic kidney disease: Secondary | ICD-10-CM | POA: Diagnosis not present

## 2014-03-27 DIAGNOSIS — N186 End stage renal disease: Secondary | ICD-10-CM | POA: Diagnosis not present

## 2014-03-27 DIAGNOSIS — N2581 Secondary hyperparathyroidism of renal origin: Secondary | ICD-10-CM | POA: Diagnosis not present

## 2014-03-27 DIAGNOSIS — N039 Chronic nephritic syndrome with unspecified morphologic changes: Secondary | ICD-10-CM | POA: Diagnosis not present

## 2014-03-29 DIAGNOSIS — N2581 Secondary hyperparathyroidism of renal origin: Secondary | ICD-10-CM | POA: Diagnosis not present

## 2014-03-29 DIAGNOSIS — D509 Iron deficiency anemia, unspecified: Secondary | ICD-10-CM | POA: Diagnosis not present

## 2014-03-29 DIAGNOSIS — D631 Anemia in chronic kidney disease: Secondary | ICD-10-CM | POA: Diagnosis not present

## 2014-03-29 DIAGNOSIS — N186 End stage renal disease: Secondary | ICD-10-CM | POA: Diagnosis not present

## 2014-03-29 DIAGNOSIS — N039 Chronic nephritic syndrome with unspecified morphologic changes: Secondary | ICD-10-CM | POA: Diagnosis not present

## 2014-04-01 DIAGNOSIS — N2581 Secondary hyperparathyroidism of renal origin: Secondary | ICD-10-CM | POA: Diagnosis not present

## 2014-04-01 DIAGNOSIS — D631 Anemia in chronic kidney disease: Secondary | ICD-10-CM | POA: Diagnosis not present

## 2014-04-01 DIAGNOSIS — D509 Iron deficiency anemia, unspecified: Secondary | ICD-10-CM | POA: Diagnosis not present

## 2014-04-01 DIAGNOSIS — N186 End stage renal disease: Secondary | ICD-10-CM | POA: Diagnosis not present

## 2014-04-03 DIAGNOSIS — N2581 Secondary hyperparathyroidism of renal origin: Secondary | ICD-10-CM | POA: Diagnosis not present

## 2014-04-03 DIAGNOSIS — N186 End stage renal disease: Secondary | ICD-10-CM | POA: Diagnosis not present

## 2014-04-03 DIAGNOSIS — D509 Iron deficiency anemia, unspecified: Secondary | ICD-10-CM | POA: Diagnosis not present

## 2014-04-03 DIAGNOSIS — D631 Anemia in chronic kidney disease: Secondary | ICD-10-CM | POA: Diagnosis not present

## 2014-04-05 DIAGNOSIS — D631 Anemia in chronic kidney disease: Secondary | ICD-10-CM | POA: Diagnosis not present

## 2014-04-05 DIAGNOSIS — N186 End stage renal disease: Secondary | ICD-10-CM | POA: Diagnosis not present

## 2014-04-05 DIAGNOSIS — D509 Iron deficiency anemia, unspecified: Secondary | ICD-10-CM | POA: Diagnosis not present

## 2014-04-05 DIAGNOSIS — N2581 Secondary hyperparathyroidism of renal origin: Secondary | ICD-10-CM | POA: Diagnosis not present

## 2014-04-08 DIAGNOSIS — N2581 Secondary hyperparathyroidism of renal origin: Secondary | ICD-10-CM | POA: Diagnosis not present

## 2014-04-08 DIAGNOSIS — D631 Anemia in chronic kidney disease: Secondary | ICD-10-CM | POA: Diagnosis not present

## 2014-04-08 DIAGNOSIS — N186 End stage renal disease: Secondary | ICD-10-CM | POA: Diagnosis not present

## 2014-04-08 DIAGNOSIS — D509 Iron deficiency anemia, unspecified: Secondary | ICD-10-CM | POA: Diagnosis not present

## 2014-04-09 ENCOUNTER — Encounter: Payer: Self-pay | Admitting: Internal Medicine

## 2014-04-09 ENCOUNTER — Ambulatory Visit: Payer: Medicare Other | Admitting: Internal Medicine

## 2014-04-09 VITALS — BP 122/70 | HR 76 | Ht 71.0 in | Wt 155.8 lb

## 2014-04-09 DIAGNOSIS — K648 Other hemorrhoids: Secondary | ICD-10-CM

## 2014-04-09 DIAGNOSIS — B181 Chronic viral hepatitis B without delta-agent: Secondary | ICD-10-CM

## 2014-04-09 DIAGNOSIS — F1010 Alcohol abuse, uncomplicated: Secondary | ICD-10-CM

## 2014-04-09 DIAGNOSIS — B182 Chronic viral hepatitis C: Secondary | ICD-10-CM

## 2014-04-09 MED ORDER — HYDROCORTISONE 1 % RE CREA: 1.0000 "application " | TOPICAL_CREAM | Freq: Two times a day (BID) | RECTAL | Status: DC

## 2014-04-09 NOTE — Assessment & Plan Note (Signed)
Need to understand liver status - get [place] before any more banding HC cream prn for now

## 2014-04-09 NOTE — Progress Notes (Signed)
   Subjective:    Patient ID: [name], male    DOB: 02/19/1961, 53 y.o.   MRN: [id number]  HPI Patient is here for followup. He is complaining of persistent itching and swelling of his hemorrhoids. When last year I banded 1 hemorrhoid column, and advised she get further workup of his liver disease, subsequently, because of concerns about potential cirrhosis though I did not see varices on his rectal exam and anoscopy. He has not yet done this. He continues to drink despite advice not to do. He continues to dialyze Monday Wednesday Friday. Medications, allergies, past medical history, past surgical history, family history and social history are reviewed and updated in the EMR.   Review of Systems     Objective:   Physical Exam Thin middle-aged black man in no acute distress Inspection of the rectal area shows a small amount of perianal stool, no prolapsed hemorrhoids. No rash.    Assessment & Plan:   Internal hemorrhoids with bleeding, swelling and itching Need to understand liver status - get [place] before any more banding HC cream prn for now  Chronic hepatitis C without hepatic coma Needs [place] w/ elastography Still needs to stop EtOH - declines Will refer to [facility] if they would treat an active drinker  ALCOHOL ABUSE Once again advised cessation - he declines  Chronic hepatitis B His hepatitis B DNA level was negative or nondetectable. So, it's not clear to me the axilla has hepatitis B though he has a chronic positive surface antigen, his core antibody total was positive suggesting some element of immunity.

## 2014-04-09 NOTE — Assessment & Plan Note (Signed)
Needs [place] w/ elastography Still needs to stop EtOH - declines Will refer to [facility] if they would treat an active drinker

## 2014-04-09 NOTE — Patient Instructions (Addendum)
You have been scheduled for an abdominal ultrasound at [facility] on 04/18/14 at 11:00am. Please arrive 15 minutes prior to your appointment for registration. Make certain not to have anything to eat or drink after midnight prior to your appointment. Should you need to reschedule your appointment, please contact radiology at [phone]. This test typically takes about 30 minutes to perform. (order was in and I just r/s'ed)  We have sent the following medications to your pharmacy for you to pick up at your convenience: Rectal cream  I appreciate the opportunity to care for you.

## 2014-04-09 NOTE — Assessment & Plan Note (Signed)
Once again advised cessation - he declines

## 2014-04-09 NOTE — Assessment & Plan Note (Signed)
His hepatitis B DNA level was negative or nondetectable. So, it's not clear to me the axilla has hepatitis B though he has a chronic positive surface antigen, his core antibody total was positive suggesting some element of immunity.

## 2014-04-10 DIAGNOSIS — N2581 Secondary hyperparathyroidism of renal origin: Secondary | ICD-10-CM | POA: Diagnosis not present

## 2014-04-10 DIAGNOSIS — N186 End stage renal disease: Secondary | ICD-10-CM | POA: Diagnosis not present

## 2014-04-10 DIAGNOSIS — D631 Anemia in chronic kidney disease: Secondary | ICD-10-CM | POA: Diagnosis not present

## 2014-04-10 DIAGNOSIS — D509 Iron deficiency anemia, unspecified: Secondary | ICD-10-CM | POA: Diagnosis not present

## 2014-04-12 DIAGNOSIS — D509 Iron deficiency anemia, unspecified: Secondary | ICD-10-CM | POA: Diagnosis not present

## 2014-04-12 DIAGNOSIS — N2581 Secondary hyperparathyroidism of renal origin: Secondary | ICD-10-CM | POA: Diagnosis not present

## 2014-04-12 DIAGNOSIS — D631 Anemia in chronic kidney disease: Secondary | ICD-10-CM | POA: Diagnosis not present

## 2014-04-12 DIAGNOSIS — N186 End stage renal disease: Secondary | ICD-10-CM | POA: Diagnosis not present

## 2014-04-12 DIAGNOSIS — Z23 Encounter for immunization: Secondary | ICD-10-CM | POA: Diagnosis not present

## 2014-04-18 ENCOUNTER — Ambulatory Visit (HOSPITAL_COMMUNITY): Payer: Medicare Other

## 2014-04-30 ENCOUNTER — Ambulatory Visit (HOSPITAL_COMMUNITY): Payer: Medicare Other

## 2014-05-08 ENCOUNTER — Other Ambulatory Visit: Payer: Self-pay | Admitting: Nurse Practitioner

## 2014-05-08 DIAGNOSIS — R825 Elevated urine levels of drugs, medicaments and biological substances: Secondary | ICD-10-CM | POA: Diagnosis not present

## 2014-05-11 DIAGNOSIS — Z992 Dependence on renal dialysis: Secondary | ICD-10-CM | POA: Diagnosis not present

## 2014-05-11 DIAGNOSIS — N186 End stage renal disease: Secondary | ICD-10-CM | POA: Diagnosis not present

## 2014-05-13 DIAGNOSIS — D631 Anemia in chronic kidney disease: Secondary | ICD-10-CM | POA: Diagnosis not present

## 2014-05-13 DIAGNOSIS — N186 End stage renal disease: Secondary | ICD-10-CM | POA: Diagnosis not present

## 2014-05-13 DIAGNOSIS — D509 Iron deficiency anemia, unspecified: Secondary | ICD-10-CM | POA: Diagnosis not present

## 2014-05-13 DIAGNOSIS — N2581 Secondary hyperparathyroidism of renal origin: Secondary | ICD-10-CM | POA: Diagnosis not present

## 2014-05-15 DIAGNOSIS — D509 Iron deficiency anemia, unspecified: Secondary | ICD-10-CM | POA: Diagnosis not present

## 2014-05-15 DIAGNOSIS — N186 End stage renal disease: Secondary | ICD-10-CM | POA: Diagnosis not present

## 2014-05-15 DIAGNOSIS — D631 Anemia in chronic kidney disease: Secondary | ICD-10-CM | POA: Diagnosis not present

## 2014-05-15 DIAGNOSIS — N2581 Secondary hyperparathyroidism of renal origin: Secondary | ICD-10-CM | POA: Diagnosis not present

## 2014-05-17 DIAGNOSIS — D631 Anemia in chronic kidney disease: Secondary | ICD-10-CM | POA: Diagnosis not present

## 2014-05-17 DIAGNOSIS — D509 Iron deficiency anemia, unspecified: Secondary | ICD-10-CM | POA: Diagnosis not present

## 2014-05-17 DIAGNOSIS — N186 End stage renal disease: Secondary | ICD-10-CM | POA: Diagnosis not present

## 2014-05-17 DIAGNOSIS — N2581 Secondary hyperparathyroidism of renal origin: Secondary | ICD-10-CM | POA: Diagnosis not present

## 2014-05-20 DIAGNOSIS — N2581 Secondary hyperparathyroidism of renal origin: Secondary | ICD-10-CM | POA: Diagnosis not present

## 2014-05-20 DIAGNOSIS — D509 Iron deficiency anemia, unspecified: Secondary | ICD-10-CM | POA: Diagnosis not present

## 2014-05-20 DIAGNOSIS — N186 End stage renal disease: Secondary | ICD-10-CM | POA: Diagnosis not present

## 2014-05-20 DIAGNOSIS — D631 Anemia in chronic kidney disease: Secondary | ICD-10-CM | POA: Diagnosis not present

## 2014-05-22 DIAGNOSIS — N2581 Secondary hyperparathyroidism of renal origin: Secondary | ICD-10-CM | POA: Diagnosis not present

## 2014-05-22 DIAGNOSIS — N186 End stage renal disease: Secondary | ICD-10-CM | POA: Diagnosis not present

## 2014-05-22 DIAGNOSIS — D509 Iron deficiency anemia, unspecified: Secondary | ICD-10-CM | POA: Diagnosis not present

## 2014-05-22 DIAGNOSIS — D631 Anemia in chronic kidney disease: Secondary | ICD-10-CM | POA: Diagnosis not present

## 2014-05-24 DIAGNOSIS — N186 End stage renal disease: Secondary | ICD-10-CM | POA: Diagnosis not present

## 2014-05-24 DIAGNOSIS — D631 Anemia in chronic kidney disease: Secondary | ICD-10-CM | POA: Diagnosis not present

## 2014-05-24 DIAGNOSIS — D509 Iron deficiency anemia, unspecified: Secondary | ICD-10-CM | POA: Diagnosis not present

## 2014-05-24 DIAGNOSIS — N2581 Secondary hyperparathyroidism of renal origin: Secondary | ICD-10-CM | POA: Diagnosis not present

## 2014-05-27 DIAGNOSIS — N186 End stage renal disease: Secondary | ICD-10-CM | POA: Diagnosis not present

## 2014-05-27 DIAGNOSIS — N2581 Secondary hyperparathyroidism of renal origin: Secondary | ICD-10-CM | POA: Diagnosis not present

## 2014-05-27 DIAGNOSIS — D631 Anemia in chronic kidney disease: Secondary | ICD-10-CM | POA: Diagnosis not present

## 2014-05-27 DIAGNOSIS — D509 Iron deficiency anemia, unspecified: Secondary | ICD-10-CM | POA: Diagnosis not present

## 2014-05-29 DIAGNOSIS — D509 Iron deficiency anemia, unspecified: Secondary | ICD-10-CM | POA: Diagnosis not present

## 2014-05-29 DIAGNOSIS — D631 Anemia in chronic kidney disease: Secondary | ICD-10-CM | POA: Diagnosis not present

## 2014-05-29 DIAGNOSIS — N2581 Secondary hyperparathyroidism of renal origin: Secondary | ICD-10-CM | POA: Diagnosis not present

## 2014-05-29 DIAGNOSIS — N186 End stage renal disease: Secondary | ICD-10-CM | POA: Diagnosis not present

## 2014-05-31 DIAGNOSIS — N2581 Secondary hyperparathyroidism of renal origin: Secondary | ICD-10-CM | POA: Diagnosis not present

## 2014-05-31 DIAGNOSIS — N186 End stage renal disease: Secondary | ICD-10-CM | POA: Diagnosis not present

## 2014-05-31 DIAGNOSIS — D631 Anemia in chronic kidney disease: Secondary | ICD-10-CM | POA: Diagnosis not present

## 2014-05-31 DIAGNOSIS — D509 Iron deficiency anemia, unspecified: Secondary | ICD-10-CM | POA: Diagnosis not present

## 2014-06-02 DIAGNOSIS — N2581 Secondary hyperparathyroidism of renal origin: Secondary | ICD-10-CM | POA: Diagnosis not present

## 2014-06-02 DIAGNOSIS — D509 Iron deficiency anemia, unspecified: Secondary | ICD-10-CM | POA: Diagnosis not present

## 2014-06-02 DIAGNOSIS — D631 Anemia in chronic kidney disease: Secondary | ICD-10-CM | POA: Diagnosis not present

## 2014-06-02 DIAGNOSIS — N186 End stage renal disease: Secondary | ICD-10-CM | POA: Diagnosis not present

## 2014-06-04 DIAGNOSIS — N186 End stage renal disease: Secondary | ICD-10-CM | POA: Diagnosis not present

## 2014-06-04 DIAGNOSIS — D509 Iron deficiency anemia, unspecified: Secondary | ICD-10-CM | POA: Diagnosis not present

## 2014-06-04 DIAGNOSIS — N2581 Secondary hyperparathyroidism of renal origin: Secondary | ICD-10-CM | POA: Diagnosis not present

## 2014-06-04 DIAGNOSIS — D631 Anemia in chronic kidney disease: Secondary | ICD-10-CM | POA: Diagnosis not present

## 2014-06-07 DIAGNOSIS — D509 Iron deficiency anemia, unspecified: Secondary | ICD-10-CM | POA: Diagnosis not present

## 2014-06-07 DIAGNOSIS — D631 Anemia in chronic kidney disease: Secondary | ICD-10-CM | POA: Diagnosis not present

## 2014-06-07 DIAGNOSIS — N2581 Secondary hyperparathyroidism of renal origin: Secondary | ICD-10-CM | POA: Diagnosis not present

## 2014-06-07 DIAGNOSIS — N186 End stage renal disease: Secondary | ICD-10-CM | POA: Diagnosis not present

## 2014-06-10 DIAGNOSIS — D631 Anemia in chronic kidney disease: Secondary | ICD-10-CM | POA: Diagnosis not present

## 2014-06-10 DIAGNOSIS — N2581 Secondary hyperparathyroidism of renal origin: Secondary | ICD-10-CM | POA: Diagnosis not present

## 2014-06-10 DIAGNOSIS — Z992 Dependence on renal dialysis: Secondary | ICD-10-CM | POA: Diagnosis not present

## 2014-06-10 DIAGNOSIS — D509 Iron deficiency anemia, unspecified: Secondary | ICD-10-CM | POA: Diagnosis not present

## 2014-06-10 DIAGNOSIS — N186 End stage renal disease: Secondary | ICD-10-CM | POA: Diagnosis not present

## 2014-06-12 DIAGNOSIS — N2581 Secondary hyperparathyroidism of renal origin: Secondary | ICD-10-CM | POA: Diagnosis not present

## 2014-06-12 DIAGNOSIS — D509 Iron deficiency anemia, unspecified: Secondary | ICD-10-CM | POA: Diagnosis not present

## 2014-06-12 DIAGNOSIS — D631 Anemia in chronic kidney disease: Secondary | ICD-10-CM | POA: Diagnosis not present

## 2014-06-12 DIAGNOSIS — N186 End stage renal disease: Secondary | ICD-10-CM | POA: Diagnosis not present

## 2014-07-11 DIAGNOSIS — Z992 Dependence on renal dialysis: Secondary | ICD-10-CM | POA: Diagnosis not present

## 2014-07-11 DIAGNOSIS — N186 End stage renal disease: Secondary | ICD-10-CM | POA: Diagnosis not present

## 2014-07-13 DIAGNOSIS — N2581 Secondary hyperparathyroidism of renal origin: Secondary | ICD-10-CM | POA: Diagnosis not present

## 2014-07-13 DIAGNOSIS — D509 Iron deficiency anemia, unspecified: Secondary | ICD-10-CM | POA: Diagnosis not present

## 2014-07-13 DIAGNOSIS — D631 Anemia in chronic kidney disease: Secondary | ICD-10-CM | POA: Diagnosis not present

## 2014-07-13 DIAGNOSIS — N186 End stage renal disease: Secondary | ICD-10-CM | POA: Diagnosis not present

## 2014-07-15 DIAGNOSIS — D509 Iron deficiency anemia, unspecified: Secondary | ICD-10-CM | POA: Diagnosis not present

## 2014-07-15 DIAGNOSIS — D631 Anemia in chronic kidney disease: Secondary | ICD-10-CM | POA: Diagnosis not present

## 2014-07-15 DIAGNOSIS — N186 End stage renal disease: Secondary | ICD-10-CM | POA: Diagnosis not present

## 2014-07-15 DIAGNOSIS — N2581 Secondary hyperparathyroidism of renal origin: Secondary | ICD-10-CM | POA: Diagnosis not present

## 2014-07-17 DIAGNOSIS — D509 Iron deficiency anemia, unspecified: Secondary | ICD-10-CM | POA: Diagnosis not present

## 2014-07-17 DIAGNOSIS — N2581 Secondary hyperparathyroidism of renal origin: Secondary | ICD-10-CM | POA: Diagnosis not present

## 2014-07-17 DIAGNOSIS — D631 Anemia in chronic kidney disease: Secondary | ICD-10-CM | POA: Diagnosis not present

## 2014-07-17 DIAGNOSIS — N186 End stage renal disease: Secondary | ICD-10-CM | POA: Diagnosis not present

## 2014-07-19 DIAGNOSIS — D509 Iron deficiency anemia, unspecified: Secondary | ICD-10-CM | POA: Diagnosis not present

## 2014-07-19 DIAGNOSIS — N186 End stage renal disease: Secondary | ICD-10-CM | POA: Diagnosis not present

## 2014-07-19 DIAGNOSIS — N2581 Secondary hyperparathyroidism of renal origin: Secondary | ICD-10-CM | POA: Diagnosis not present

## 2014-07-19 DIAGNOSIS — D631 Anemia in chronic kidney disease: Secondary | ICD-10-CM | POA: Diagnosis not present

## 2014-07-22 DIAGNOSIS — N2581 Secondary hyperparathyroidism of renal origin: Secondary | ICD-10-CM | POA: Diagnosis not present

## 2014-07-22 DIAGNOSIS — D631 Anemia in chronic kidney disease: Secondary | ICD-10-CM | POA: Diagnosis not present

## 2014-07-22 DIAGNOSIS — N186 End stage renal disease: Secondary | ICD-10-CM | POA: Diagnosis not present

## 2014-07-22 DIAGNOSIS — D509 Iron deficiency anemia, unspecified: Secondary | ICD-10-CM | POA: Diagnosis not present

## 2014-07-24 DIAGNOSIS — N186 End stage renal disease: Secondary | ICD-10-CM | POA: Diagnosis not present

## 2014-07-24 DIAGNOSIS — D631 Anemia in chronic kidney disease: Secondary | ICD-10-CM | POA: Diagnosis not present

## 2014-07-24 DIAGNOSIS — N2581 Secondary hyperparathyroidism of renal origin: Secondary | ICD-10-CM | POA: Diagnosis not present

## 2014-07-24 DIAGNOSIS — D509 Iron deficiency anemia, unspecified: Secondary | ICD-10-CM | POA: Diagnosis not present

## 2014-07-26 DIAGNOSIS — D509 Iron deficiency anemia, unspecified: Secondary | ICD-10-CM | POA: Diagnosis not present

## 2014-07-26 DIAGNOSIS — D631 Anemia in chronic kidney disease: Secondary | ICD-10-CM | POA: Diagnosis not present

## 2014-07-26 DIAGNOSIS — N186 End stage renal disease: Secondary | ICD-10-CM | POA: Diagnosis not present

## 2014-07-26 DIAGNOSIS — N2581 Secondary hyperparathyroidism of renal origin: Secondary | ICD-10-CM | POA: Diagnosis not present

## 2014-07-29 DIAGNOSIS — N186 End stage renal disease: Secondary | ICD-10-CM | POA: Diagnosis not present

## 2014-07-29 DIAGNOSIS — N2581 Secondary hyperparathyroidism of renal origin: Secondary | ICD-10-CM | POA: Diagnosis not present

## 2014-07-29 DIAGNOSIS — D631 Anemia in chronic kidney disease: Secondary | ICD-10-CM | POA: Diagnosis not present

## 2014-07-29 DIAGNOSIS — D509 Iron deficiency anemia, unspecified: Secondary | ICD-10-CM | POA: Diagnosis not present

## 2014-07-31 DIAGNOSIS — N186 End stage renal disease: Secondary | ICD-10-CM | POA: Diagnosis not present

## 2014-07-31 DIAGNOSIS — D509 Iron deficiency anemia, unspecified: Secondary | ICD-10-CM | POA: Diagnosis not present

## 2014-07-31 DIAGNOSIS — D631 Anemia in chronic kidney disease: Secondary | ICD-10-CM | POA: Diagnosis not present

## 2014-07-31 DIAGNOSIS — N2581 Secondary hyperparathyroidism of renal origin: Secondary | ICD-10-CM | POA: Diagnosis not present

## 2014-08-02 ENCOUNTER — Emergency Department (HOSPITAL_COMMUNITY)
Admission: EM | Admit: 2014-08-02 | Discharge: 2014-08-02 | Disposition: A | Discharge status: Home / Self Care | Payer: Medicare Other | Attending: Emergency Medicine | Admitting: Emergency Medicine

## 2014-08-02 ENCOUNTER — Encounter (HOSPITAL_COMMUNITY): Payer: Self-pay

## 2014-08-02 DIAGNOSIS — Z8619 Personal history of other infectious and parasitic diseases: Secondary | ICD-10-CM | POA: Diagnosis not present

## 2014-08-02 DIAGNOSIS — T82838A Hemorrhage of vascular prosthetic devices, implants and grafts, initial encounter: Secondary | ICD-10-CM | POA: Insufficient documentation

## 2014-08-02 DIAGNOSIS — Z720 Tobacco use: Secondary | ICD-10-CM | POA: Insufficient documentation

## 2014-08-02 DIAGNOSIS — R58 Hemorrhage, not elsewhere classified: Secondary | ICD-10-CM

## 2014-08-02 DIAGNOSIS — N189 Chronic kidney disease, unspecified: Secondary | ICD-10-CM | POA: Insufficient documentation

## 2014-08-02 DIAGNOSIS — Y846 Urinary catheterization as the cause of abnormal reaction of the patient, or of later complication, without mention of misadventure at the time of the procedure: Secondary | ICD-10-CM | POA: Insufficient documentation

## 2014-08-02 DIAGNOSIS — Z862 Personal history of diseases of the blood and blood-forming organs and certain disorders involving the immune mechanism: Secondary | ICD-10-CM | POA: Insufficient documentation

## 2014-08-02 DIAGNOSIS — Z7952 Long term (current) use of systemic steroids: Secondary | ICD-10-CM | POA: Insufficient documentation

## 2014-08-02 DIAGNOSIS — Z8719 Personal history of other diseases of the digestive system: Secondary | ICD-10-CM | POA: Diagnosis not present

## 2014-08-02 DIAGNOSIS — Z8639 Personal history of other endocrine, nutritional and metabolic disease: Secondary | ICD-10-CM | POA: Insufficient documentation

## 2014-08-02 DIAGNOSIS — I770 Arteriovenous fistula, acquired: Secondary | ICD-10-CM | POA: Diagnosis not present

## 2014-08-02 DIAGNOSIS — D631 Anemia in chronic kidney disease: Secondary | ICD-10-CM | POA: Diagnosis not present

## 2014-08-02 DIAGNOSIS — Z8739 Personal history of other diseases of the musculoskeletal system and connective tissue: Secondary | ICD-10-CM | POA: Insufficient documentation

## 2014-08-02 DIAGNOSIS — I129 Hypertensive chronic kidney disease with stage 1 through stage 4 chronic kidney disease, or unspecified chronic kidney disease: Secondary | ICD-10-CM | POA: Diagnosis not present

## 2014-08-02 DIAGNOSIS — Z79899 Other long term (current) drug therapy: Secondary | ICD-10-CM | POA: Diagnosis not present

## 2014-08-02 DIAGNOSIS — I252 Old myocardial infarction: Secondary | ICD-10-CM | POA: Insufficient documentation

## 2014-08-02 DIAGNOSIS — S4992XA Unspecified injury of left shoulder and upper arm, initial encounter: Secondary | ICD-10-CM | POA: Diagnosis not present

## 2014-08-02 DIAGNOSIS — Z86010 Personal history of colonic polyps: Secondary | ICD-10-CM | POA: Insufficient documentation

## 2014-08-02 DIAGNOSIS — N186 End stage renal disease: Secondary | ICD-10-CM | POA: Diagnosis not present

## 2014-08-02 DIAGNOSIS — N2581 Secondary hyperparathyroidism of renal origin: Secondary | ICD-10-CM | POA: Diagnosis not present

## 2014-08-02 DIAGNOSIS — D509 Iron deficiency anemia, unspecified: Secondary | ICD-10-CM | POA: Diagnosis not present

## 2014-08-02 DIAGNOSIS — R030 Elevated blood-pressure reading, without diagnosis of hypertension: Secondary | ICD-10-CM | POA: Diagnosis not present

## 2014-08-02 MED ORDER — CARVEDILOL 25 MG PO TABS
25.0000 mg | ORAL_TABLET | Freq: Once | ORAL | Status: AC
  Administered 2014-08-02: 25 mg via ORAL
  Filled 2014-08-02: qty 1

## 2014-08-02 MED ORDER — HYDRALAZINE HCL 25 MG PO TABS
25.0000 mg | ORAL_TABLET | Freq: Once | ORAL | Status: AC
Start: 2014-08-02 — End: 2014-08-02
  Administered 2014-08-02: 25 mg via ORAL
  Filled 2014-08-02: qty 1

## 2014-08-02 MED ORDER — AMLODIPINE BESYLATE 5 MG PO TABS
10.0000 mg | ORAL_TABLET | Freq: Once | ORAL | Status: AC
  Administered 2014-08-02: 10 mg via ORAL
  Filled 2014-08-02: qty 2

## 2014-08-02 MED ORDER — LISINOPRIL 20 MG PO TABS
40.0000 mg | ORAL_TABLET | Freq: Once | ORAL | Status: AC
  Administered 2014-08-02: 40 mg via ORAL
  Filled 2014-08-02: qty 2

## 2014-08-02 NOTE — Discharge Instructions (Signed)
AV Fistula, Care After  SEEK MEDICAL CARE IF:   You have a fever.  You have swelling around the fistula that gets worse, or you have new pain.  You have unusual bleeding at the fistula site or from any other area.  You have pus or other drainage at the fistula site.  You have skin redness or red streaking on the skin around, above, or below the fistula site.  Your access site feels warm.  You have any flu-like symptoms. SEEK IMMEDIATE MEDICAL CARE IF:   You have pain, numbness, or an unusual pale skin on the hand or on the side of your fistula.  You have dizziness or weakness that you have not had before.  You have shortness of breath.  You have chest pain.  Your fistula disconnects or breaks, and there is bleeding that cannot be easily controlled. Call for local emergency medical help. Do not try to drive yourself to the hospital. MAKE SURE YOU  Understand these instructions.  Will watch your condition.  Will get help right away if you are not doing well or get worse. Document Released: 06/28/2005 Document Revised: 11/12/2013 Document Reviewed: 12/16/2010 [facility] Patient Information 2015 [place], [place]. This information is not intended to replace advice given to you by your health care provider. Make sure you discuss any questions you have with your health care provider.

## 2014-08-02 NOTE — ED Notes (Addendum)
Redressed pt. Dialysis site. No bleeding noted.

## 2014-08-02 NOTE — ED Notes (Signed)
Per [facility]: pt. Is from home. Pt. Received full dialysis txt this AM. Approximately 20 minutes after getting home his shunt started bleeding. Able to control bleeding with minimal pressure from wash cloth. EMS changed to dry bandage, bleeding controlled at this time. 2+ radial pulses. Hypertensive 210 palpated by EMS. States he has not taken his blood pressure medication this AM because he didn't have time.

## 2014-08-02 NOTE — ED Provider Notes (Signed)
CSN: [id number]     Arrival date & time 08/02/14  1159 History   First MD Initiated Contact with Patient 08/02/14 1201     Chief Complaint  Patient presents with  . bleeding dialysis shunt      (Consider location/radiation/quality/duration/timing/severity/associated sxs/prior Treatment) HPI The patient ports that he had his dialysis morning. He reports after he got home he took the dressing off and it started to bleed. He reports that usually he would not have problems bleeding when he removed his dressing. He reports it bled a lot and he had to put pressure on it. Medics dressed it and he has not had further bleeding. The patient reports he has not taken his morning blood pressure medications at this point. He has no other complaint. Past Medical History  Diagnosis Date  . Chronic kidney disease   . Hypertension   . Hepatitis B, chronic   . Hepatitis C   . Anemia   . Metabolic bone disease   . Renal disorder   . Chest pain     DATE UNKNOWN, C/O PERIODICALLY  . GERD (gastroesophageal reflux disease)     DATE UNKNOWN  . Headache(784.0)     OCCASIONALLY  . Dialysis patient     Monday-Wednesday-Friday  . Hyperkalemia   . Pulmonary edema   . Hemorrhoids   . Adenomatous colon polyp     tubular  . Myocardial infarction    Past Surgical History  Procedure Laterality Date  . Av fistula placement  2012    BELIEVED WAS PLACED IN [place]  . Hemorrhoid banding     Family History  Problem Relation Age of Onset  . Heart disease Mother   . Lung cancer Mother   . Heart disease Father   . Malignant hyperthermia Father   . Hypertension Other   . COPD Other   . Colon cancer Neg Hx   . Throat cancer Sister    History  Substance Use Topics  . Smoking status: Current Every Day Smoker -- 0.50 packs/day for 37 years    Types: Cigarettes  . Smokeless tobacco: Never Used  . Alcohol Use: 2.3 oz/week    1 Glasses of wine, 1 Cans of beer, 1 Shots of liquor, 1 Not specified per week   Comment: frequently    Review of Systems Constitutional: No fever no chills or general constitutional illness Cardiovascular: No chest pain, no shortness of breath, no lightheadedness.   Allergies  Aspirin; Clonidine derivatives; and Tramadol  Home Medications   Prior to Admission medications   Medication Sig Start Date End Date Taking? Authorizing Provider  albuterol (PROVENTIL HFA;VENTOLIN HFA) 108 (90 BASE) MCG/ACT inhaler Inhale 2 puffs into the lungs every 4 (four) hours as needed for wheezing or shortness of breath.   Yes Historical Provider, MD  amLODipine (NORVASC) 10 MG tablet Take 10 mg by mouth 2 (two) times daily.    Yes Historical Provider, MD  carvedilol (COREG) 25 MG tablet Take 25 mg by mouth 2 (two) times daily with a meal.   Yes Historical Provider, MD  DULERA 200-5 MCG/ACT AERO Inhale 2-3 puffs into the lungs 2 (two) times daily.  07/31/14  Yes Historical Provider, MD  hydrALAZINE (APRESOLINE) 25 MG tablet Take 1 tablet (25 mg total) by mouth every 8 (eight) hours. 02/28/14  Yes Laverda Page, MD  hydrocortisone (PROCTOCORT) 1 % CREA Apply 1 application topically 2 (two) times daily. 04/09/14  Yes Gatha Mayer, MD  lisinopril (PRINIVIL,ZESTRIL) 20 MG  tablet Take 40 mg by mouth daily.  09/07/12  Yes Historical Provider, MD  minoxidil (LONITEN) 10 MG tablet Take 5 mg by mouth 2 (two) times daily.  07/17/13  Yes Historical Provider, MD  multivitamin (RENA-VIT) TABS tablet Take 1 tablet by mouth daily.   Yes Historical Provider, MD  polyethylene glycol powder (GLYCOLAX/MIRALAX) powder Take 17 g by mouth daily. 03/05/14  Yes Gatha Mayer, MD  RENVELA 800 MG tablet Take 1,600-3,200 mg by mouth 3 (three) times daily with meals. Take 4 tablets (3200 mg) with each meal and 2 (1600 mg) tablets with snacks. 07/09/13  Yes Historical Provider, MD   BP 166/68 mmHg  Pulse 68  Temp(Src) 97.6 F (36.4 C) (Oral)  Resp 14  SpO2 100% Physical Exam  Constitutional: He is oriented to  person, place, and time. He appears well-developed and well-nourished.  The patient is well appearance sitting on the stretcher talking on phone.  HENT:  Head: Normocephalic and atraumatic.  Eyes: EOM are normal.  Cardiovascular: Intact distal pulses.   Pulmonary/Chest: Effort normal.  Musculoskeletal:  The patient has a pressure dressing on the left upper arm over his AV fistula site. There is no blood on the pressure dressing. He has a smaller taped dressing on the lower portion of the fistula that has a small amount of blood on it but is not saturated. This is in a non-pressure site. His radial pulses 2+ and strong. The hand is warm and dry with brisk cap refill. The fistula has a easily palpable thrill.  Neurological: He is alert and oriented to person, place, and time. Coordination normal.  Skin: Skin is warm and dry.  Psychiatric: He has a normal mood and affect.    ED Course  Procedures (including critical care time) Labs Review Labs Reviewed - No data to display  Imaging Review No results found.   EKG Interpretation None      MDM   Final diagnoses:  A-V fistula  Bleeding   The patient's bleeding was controlled by his time of arrival to the emergency department. A gauze pressure dressing had been placed by medics that showed no saturating with blood. A small area of dressing that had blood on it was not pressure wrapped. At this site as well there was no active bleeding. The patient was given his blood pressure medications that he had missed from the morning. The dressing was subsequently removed after approximately an hour and there was no active bleeding. A non pressure dressing was reapplied.    Charlesetta Shanks, MD 08/02/14 [phone]

## 2014-08-05 DIAGNOSIS — N2581 Secondary hyperparathyroidism of renal origin: Secondary | ICD-10-CM | POA: Diagnosis not present

## 2014-08-05 DIAGNOSIS — D631 Anemia in chronic kidney disease: Secondary | ICD-10-CM | POA: Diagnosis not present

## 2014-08-05 DIAGNOSIS — N186 End stage renal disease: Secondary | ICD-10-CM | POA: Diagnosis not present

## 2014-08-05 DIAGNOSIS — D509 Iron deficiency anemia, unspecified: Secondary | ICD-10-CM | POA: Diagnosis not present

## 2014-08-07 DIAGNOSIS — N186 End stage renal disease: Secondary | ICD-10-CM | POA: Diagnosis not present

## 2014-08-07 DIAGNOSIS — D631 Anemia in chronic kidney disease: Secondary | ICD-10-CM | POA: Diagnosis not present

## 2014-08-07 DIAGNOSIS — D509 Iron deficiency anemia, unspecified: Secondary | ICD-10-CM | POA: Diagnosis not present

## 2014-08-07 DIAGNOSIS — N2581 Secondary hyperparathyroidism of renal origin: Secondary | ICD-10-CM | POA: Diagnosis not present

## 2014-08-09 DIAGNOSIS — N2581 Secondary hyperparathyroidism of renal origin: Secondary | ICD-10-CM | POA: Diagnosis not present

## 2014-08-09 DIAGNOSIS — D631 Anemia in chronic kidney disease: Secondary | ICD-10-CM | POA: Diagnosis not present

## 2014-08-09 DIAGNOSIS — D509 Iron deficiency anemia, unspecified: Secondary | ICD-10-CM | POA: Diagnosis not present

## 2014-08-09 DIAGNOSIS — N186 End stage renal disease: Secondary | ICD-10-CM | POA: Diagnosis not present

## 2014-08-11 DIAGNOSIS — N186 End stage renal disease: Secondary | ICD-10-CM | POA: Diagnosis not present

## 2014-08-11 DIAGNOSIS — Z992 Dependence on renal dialysis: Secondary | ICD-10-CM | POA: Diagnosis not present

## 2014-08-12 DIAGNOSIS — N2581 Secondary hyperparathyroidism of renal origin: Secondary | ICD-10-CM | POA: Diagnosis not present

## 2014-08-12 DIAGNOSIS — D509 Iron deficiency anemia, unspecified: Secondary | ICD-10-CM | POA: Diagnosis not present

## 2014-08-12 DIAGNOSIS — N186 End stage renal disease: Secondary | ICD-10-CM | POA: Diagnosis not present

## 2014-08-12 DIAGNOSIS — D631 Anemia in chronic kidney disease: Secondary | ICD-10-CM | POA: Diagnosis not present

## 2014-09-09 DIAGNOSIS — Z992 Dependence on renal dialysis: Secondary | ICD-10-CM | POA: Diagnosis not present

## 2014-09-09 DIAGNOSIS — N186 End stage renal disease: Secondary | ICD-10-CM | POA: Diagnosis not present

## 2014-09-11 DIAGNOSIS — D509 Iron deficiency anemia, unspecified: Secondary | ICD-10-CM | POA: Diagnosis not present

## 2014-09-11 DIAGNOSIS — N186 End stage renal disease: Secondary | ICD-10-CM | POA: Diagnosis not present

## 2014-09-11 DIAGNOSIS — N2581 Secondary hyperparathyroidism of renal origin: Secondary | ICD-10-CM | POA: Diagnosis not present

## 2014-09-11 DIAGNOSIS — D631 Anemia in chronic kidney disease: Secondary | ICD-10-CM | POA: Diagnosis not present

## 2014-10-10 DIAGNOSIS — Z992 Dependence on renal dialysis: Secondary | ICD-10-CM | POA: Diagnosis not present

## 2014-10-10 DIAGNOSIS — N186 End stage renal disease: Secondary | ICD-10-CM | POA: Diagnosis not present

## 2014-10-10 DIAGNOSIS — I120 Hypertensive chronic kidney disease with stage 5 chronic kidney disease or end stage renal disease: Secondary | ICD-10-CM | POA: Diagnosis not present

## 2014-10-11 DIAGNOSIS — N2581 Secondary hyperparathyroidism of renal origin: Secondary | ICD-10-CM | POA: Diagnosis not present

## 2014-10-11 DIAGNOSIS — D631 Anemia in chronic kidney disease: Secondary | ICD-10-CM | POA: Diagnosis not present

## 2014-10-11 DIAGNOSIS — D509 Iron deficiency anemia, unspecified: Secondary | ICD-10-CM | POA: Diagnosis not present

## 2014-10-11 DIAGNOSIS — N186 End stage renal disease: Secondary | ICD-10-CM | POA: Diagnosis not present

## 2014-10-14 DIAGNOSIS — I10 Essential (primary) hypertension: Secondary | ICD-10-CM | POA: Diagnosis not present

## 2014-10-14 DIAGNOSIS — M25519 Pain in unspecified shoulder: Secondary | ICD-10-CM | POA: Diagnosis not present

## 2014-10-14 DIAGNOSIS — D509 Iron deficiency anemia, unspecified: Secondary | ICD-10-CM | POA: Diagnosis not present

## 2014-10-14 DIAGNOSIS — Z125 Encounter for screening for malignant neoplasm of prostate: Secondary | ICD-10-CM | POA: Diagnosis not present

## 2014-10-14 DIAGNOSIS — D631 Anemia in chronic kidney disease: Secondary | ICD-10-CM | POA: Diagnosis not present

## 2014-10-14 DIAGNOSIS — N186 End stage renal disease: Secondary | ICD-10-CM | POA: Diagnosis not present

## 2014-10-14 DIAGNOSIS — Z13220 Encounter for screening for lipoid disorders: Secondary | ICD-10-CM | POA: Diagnosis not present

## 2014-10-14 DIAGNOSIS — F17210 Nicotine dependence, cigarettes, uncomplicated: Secondary | ICD-10-CM | POA: Diagnosis not present

## 2014-10-14 DIAGNOSIS — N2581 Secondary hyperparathyroidism of renal origin: Secondary | ICD-10-CM | POA: Diagnosis not present

## 2014-10-16 DIAGNOSIS — N186 End stage renal disease: Secondary | ICD-10-CM | POA: Diagnosis not present

## 2014-10-16 DIAGNOSIS — N2581 Secondary hyperparathyroidism of renal origin: Secondary | ICD-10-CM | POA: Diagnosis not present

## 2014-10-16 DIAGNOSIS — D509 Iron deficiency anemia, unspecified: Secondary | ICD-10-CM | POA: Diagnosis not present

## 2014-10-16 DIAGNOSIS — D631 Anemia in chronic kidney disease: Secondary | ICD-10-CM | POA: Diagnosis not present

## 2014-10-18 DIAGNOSIS — N186 End stage renal disease: Secondary | ICD-10-CM | POA: Diagnosis not present

## 2014-10-18 DIAGNOSIS — D631 Anemia in chronic kidney disease: Secondary | ICD-10-CM | POA: Diagnosis not present

## 2014-10-18 DIAGNOSIS — D509 Iron deficiency anemia, unspecified: Secondary | ICD-10-CM | POA: Diagnosis not present

## 2014-10-18 DIAGNOSIS — N2581 Secondary hyperparathyroidism of renal origin: Secondary | ICD-10-CM | POA: Diagnosis not present

## 2014-10-21 DIAGNOSIS — D631 Anemia in chronic kidney disease: Secondary | ICD-10-CM | POA: Diagnosis not present

## 2014-10-21 DIAGNOSIS — N186 End stage renal disease: Secondary | ICD-10-CM | POA: Diagnosis not present

## 2014-10-21 DIAGNOSIS — N2581 Secondary hyperparathyroidism of renal origin: Secondary | ICD-10-CM | POA: Diagnosis not present

## 2014-10-21 DIAGNOSIS — D509 Iron deficiency anemia, unspecified: Secondary | ICD-10-CM | POA: Diagnosis not present

## 2014-10-23 DIAGNOSIS — D509 Iron deficiency anemia, unspecified: Secondary | ICD-10-CM | POA: Diagnosis not present

## 2014-10-23 DIAGNOSIS — D631 Anemia in chronic kidney disease: Secondary | ICD-10-CM | POA: Diagnosis not present

## 2014-10-23 DIAGNOSIS — N186 End stage renal disease: Secondary | ICD-10-CM | POA: Diagnosis not present

## 2014-10-23 DIAGNOSIS — N2581 Secondary hyperparathyroidism of renal origin: Secondary | ICD-10-CM | POA: Diagnosis not present

## 2014-10-25 DIAGNOSIS — D509 Iron deficiency anemia, unspecified: Secondary | ICD-10-CM | POA: Diagnosis not present

## 2014-10-25 DIAGNOSIS — N2581 Secondary hyperparathyroidism of renal origin: Secondary | ICD-10-CM | POA: Diagnosis not present

## 2014-10-25 DIAGNOSIS — N186 End stage renal disease: Secondary | ICD-10-CM | POA: Diagnosis not present

## 2014-10-25 DIAGNOSIS — D631 Anemia in chronic kidney disease: Secondary | ICD-10-CM | POA: Diagnosis not present

## 2014-10-28 DIAGNOSIS — N2581 Secondary hyperparathyroidism of renal origin: Secondary | ICD-10-CM | POA: Diagnosis not present

## 2014-10-28 DIAGNOSIS — D509 Iron deficiency anemia, unspecified: Secondary | ICD-10-CM | POA: Diagnosis not present

## 2014-10-28 DIAGNOSIS — N186 End stage renal disease: Secondary | ICD-10-CM | POA: Diagnosis not present

## 2014-10-28 DIAGNOSIS — D631 Anemia in chronic kidney disease: Secondary | ICD-10-CM | POA: Diagnosis not present

## 2014-10-30 DIAGNOSIS — D631 Anemia in chronic kidney disease: Secondary | ICD-10-CM | POA: Diagnosis not present

## 2014-10-30 DIAGNOSIS — D509 Iron deficiency anemia, unspecified: Secondary | ICD-10-CM | POA: Diagnosis not present

## 2014-10-30 DIAGNOSIS — N186 End stage renal disease: Secondary | ICD-10-CM | POA: Diagnosis not present

## 2014-10-30 DIAGNOSIS — N2581 Secondary hyperparathyroidism of renal origin: Secondary | ICD-10-CM | POA: Diagnosis not present

## 2014-11-01 DIAGNOSIS — D631 Anemia in chronic kidney disease: Secondary | ICD-10-CM | POA: Diagnosis not present

## 2014-11-01 DIAGNOSIS — N2581 Secondary hyperparathyroidism of renal origin: Secondary | ICD-10-CM | POA: Diagnosis not present

## 2014-11-01 DIAGNOSIS — D509 Iron deficiency anemia, unspecified: Secondary | ICD-10-CM | POA: Diagnosis not present

## 2014-11-01 DIAGNOSIS — N186 End stage renal disease: Secondary | ICD-10-CM | POA: Diagnosis not present

## 2014-11-04 DIAGNOSIS — N2581 Secondary hyperparathyroidism of renal origin: Secondary | ICD-10-CM | POA: Diagnosis not present

## 2014-11-04 DIAGNOSIS — N186 End stage renal disease: Secondary | ICD-10-CM | POA: Diagnosis not present

## 2014-11-04 DIAGNOSIS — D509 Iron deficiency anemia, unspecified: Secondary | ICD-10-CM | POA: Diagnosis not present

## 2014-11-04 DIAGNOSIS — D631 Anemia in chronic kidney disease: Secondary | ICD-10-CM | POA: Diagnosis not present

## 2014-11-06 DIAGNOSIS — Z13220 Encounter for screening for lipoid disorders: Secondary | ICD-10-CM | POA: Diagnosis not present

## 2014-11-06 DIAGNOSIS — D631 Anemia in chronic kidney disease: Secondary | ICD-10-CM | POA: Diagnosis not present

## 2014-11-06 DIAGNOSIS — N186 End stage renal disease: Secondary | ICD-10-CM | POA: Diagnosis not present

## 2014-11-06 DIAGNOSIS — R972 Elevated prostate specific antigen [PSA]: Secondary | ICD-10-CM | POA: Diagnosis not present

## 2014-11-06 DIAGNOSIS — N2581 Secondary hyperparathyroidism of renal origin: Secondary | ICD-10-CM | POA: Diagnosis not present

## 2014-11-06 DIAGNOSIS — R7989 Other specified abnormal findings of blood chemistry: Secondary | ICD-10-CM | POA: Diagnosis not present

## 2014-11-06 DIAGNOSIS — D509 Iron deficiency anemia, unspecified: Secondary | ICD-10-CM | POA: Diagnosis not present

## 2014-11-08 DIAGNOSIS — D631 Anemia in chronic kidney disease: Secondary | ICD-10-CM | POA: Diagnosis not present

## 2014-11-08 DIAGNOSIS — N2581 Secondary hyperparathyroidism of renal origin: Secondary | ICD-10-CM | POA: Diagnosis not present

## 2014-11-08 DIAGNOSIS — N186 End stage renal disease: Secondary | ICD-10-CM | POA: Diagnosis not present

## 2014-11-08 DIAGNOSIS — D509 Iron deficiency anemia, unspecified: Secondary | ICD-10-CM | POA: Diagnosis not present

## 2014-11-09 DIAGNOSIS — I120 Hypertensive chronic kidney disease with stage 5 chronic kidney disease or end stage renal disease: Secondary | ICD-10-CM | POA: Diagnosis not present

## 2014-11-09 DIAGNOSIS — Z992 Dependence on renal dialysis: Secondary | ICD-10-CM | POA: Diagnosis not present

## 2014-11-09 DIAGNOSIS — N186 End stage renal disease: Secondary | ICD-10-CM | POA: Diagnosis not present

## 2014-11-11 DIAGNOSIS — D509 Iron deficiency anemia, unspecified: Secondary | ICD-10-CM | POA: Diagnosis not present

## 2014-11-11 DIAGNOSIS — N186 End stage renal disease: Secondary | ICD-10-CM | POA: Diagnosis not present

## 2014-11-11 DIAGNOSIS — D631 Anemia in chronic kidney disease: Secondary | ICD-10-CM | POA: Diagnosis not present

## 2014-11-11 DIAGNOSIS — N2581 Secondary hyperparathyroidism of renal origin: Secondary | ICD-10-CM | POA: Diagnosis not present

## 2014-11-13 DIAGNOSIS — N186 End stage renal disease: Secondary | ICD-10-CM | POA: Diagnosis not present

## 2014-11-13 DIAGNOSIS — N2581 Secondary hyperparathyroidism of renal origin: Secondary | ICD-10-CM | POA: Diagnosis not present

## 2014-11-13 DIAGNOSIS — D631 Anemia in chronic kidney disease: Secondary | ICD-10-CM | POA: Diagnosis not present

## 2014-11-13 DIAGNOSIS — D509 Iron deficiency anemia, unspecified: Secondary | ICD-10-CM | POA: Diagnosis not present

## 2014-11-15 DIAGNOSIS — D509 Iron deficiency anemia, unspecified: Secondary | ICD-10-CM | POA: Diagnosis not present

## 2014-11-15 DIAGNOSIS — D631 Anemia in chronic kidney disease: Secondary | ICD-10-CM | POA: Diagnosis not present

## 2014-11-15 DIAGNOSIS — N2581 Secondary hyperparathyroidism of renal origin: Secondary | ICD-10-CM | POA: Diagnosis not present

## 2014-11-15 DIAGNOSIS — N186 End stage renal disease: Secondary | ICD-10-CM | POA: Diagnosis not present

## 2014-11-18 DIAGNOSIS — D509 Iron deficiency anemia, unspecified: Secondary | ICD-10-CM | POA: Diagnosis not present

## 2014-11-18 DIAGNOSIS — N2581 Secondary hyperparathyroidism of renal origin: Secondary | ICD-10-CM | POA: Diagnosis not present

## 2014-11-18 DIAGNOSIS — N186 End stage renal disease: Secondary | ICD-10-CM | POA: Diagnosis not present

## 2014-11-18 DIAGNOSIS — D631 Anemia in chronic kidney disease: Secondary | ICD-10-CM | POA: Diagnosis not present

## 2014-11-20 DIAGNOSIS — D509 Iron deficiency anemia, unspecified: Secondary | ICD-10-CM | POA: Diagnosis not present

## 2014-11-20 DIAGNOSIS — N186 End stage renal disease: Secondary | ICD-10-CM | POA: Diagnosis not present

## 2014-11-20 DIAGNOSIS — D631 Anemia in chronic kidney disease: Secondary | ICD-10-CM | POA: Diagnosis not present

## 2014-11-20 DIAGNOSIS — N2581 Secondary hyperparathyroidism of renal origin: Secondary | ICD-10-CM | POA: Diagnosis not present

## 2014-11-22 ENCOUNTER — Ambulatory Visit (HOSPITAL_COMMUNITY)
Admission: RE | Admit: 2014-11-22 | Discharge: 2014-11-22 | Disposition: A | Discharge status: Home / Self Care | Payer: Medicare Other | Source: Ambulatory Visit | Attending: Internal Medicine | Admitting: Internal Medicine

## 2014-11-22 ENCOUNTER — Other Ambulatory Visit (HOSPITAL_COMMUNITY): Payer: Self-pay | Admitting: Internal Medicine

## 2014-11-22 DIAGNOSIS — N2581 Secondary hyperparathyroidism of renal origin: Secondary | ICD-10-CM | POA: Diagnosis not present

## 2014-11-22 DIAGNOSIS — M25512 Pain in left shoulder: Secondary | ICD-10-CM

## 2014-11-22 DIAGNOSIS — N186 End stage renal disease: Secondary | ICD-10-CM | POA: Diagnosis not present

## 2014-11-22 DIAGNOSIS — D509 Iron deficiency anemia, unspecified: Secondary | ICD-10-CM | POA: Diagnosis not present

## 2014-11-22 DIAGNOSIS — D631 Anemia in chronic kidney disease: Secondary | ICD-10-CM | POA: Diagnosis not present

## 2014-11-25 DIAGNOSIS — N186 End stage renal disease: Secondary | ICD-10-CM | POA: Diagnosis not present

## 2014-11-25 DIAGNOSIS — M25519 Pain in unspecified shoulder: Secondary | ICD-10-CM | POA: Diagnosis not present

## 2014-11-25 DIAGNOSIS — N2581 Secondary hyperparathyroidism of renal origin: Secondary | ICD-10-CM | POA: Diagnosis not present

## 2014-11-25 DIAGNOSIS — D631 Anemia in chronic kidney disease: Secondary | ICD-10-CM | POA: Diagnosis not present

## 2014-11-25 DIAGNOSIS — I10 Essential (primary) hypertension: Secondary | ICD-10-CM | POA: Diagnosis not present

## 2014-11-25 DIAGNOSIS — D509 Iron deficiency anemia, unspecified: Secondary | ICD-10-CM | POA: Diagnosis not present

## 2014-11-25 DIAGNOSIS — I998 Other disorder of circulatory system: Secondary | ICD-10-CM | POA: Diagnosis not present

## 2014-11-27 DIAGNOSIS — D631 Anemia in chronic kidney disease: Secondary | ICD-10-CM | POA: Diagnosis not present

## 2014-11-27 DIAGNOSIS — N2581 Secondary hyperparathyroidism of renal origin: Secondary | ICD-10-CM | POA: Diagnosis not present

## 2014-11-27 DIAGNOSIS — N186 End stage renal disease: Secondary | ICD-10-CM | POA: Diagnosis not present

## 2014-11-27 DIAGNOSIS — D509 Iron deficiency anemia, unspecified: Secondary | ICD-10-CM | POA: Diagnosis not present

## 2014-11-29 DIAGNOSIS — N2581 Secondary hyperparathyroidism of renal origin: Secondary | ICD-10-CM | POA: Diagnosis not present

## 2014-11-29 DIAGNOSIS — N186 End stage renal disease: Secondary | ICD-10-CM | POA: Diagnosis not present

## 2014-11-29 DIAGNOSIS — D631 Anemia in chronic kidney disease: Secondary | ICD-10-CM | POA: Diagnosis not present

## 2014-11-29 DIAGNOSIS — D509 Iron deficiency anemia, unspecified: Secondary | ICD-10-CM | POA: Diagnosis not present

## 2014-12-02 DIAGNOSIS — D509 Iron deficiency anemia, unspecified: Secondary | ICD-10-CM | POA: Diagnosis not present

## 2014-12-02 DIAGNOSIS — N2581 Secondary hyperparathyroidism of renal origin: Secondary | ICD-10-CM | POA: Diagnosis not present

## 2014-12-02 DIAGNOSIS — N186 End stage renal disease: Secondary | ICD-10-CM | POA: Diagnosis not present

## 2014-12-02 DIAGNOSIS — D631 Anemia in chronic kidney disease: Secondary | ICD-10-CM | POA: Diagnosis not present

## 2014-12-04 DIAGNOSIS — D631 Anemia in chronic kidney disease: Secondary | ICD-10-CM | POA: Diagnosis not present

## 2014-12-04 DIAGNOSIS — D509 Iron deficiency anemia, unspecified: Secondary | ICD-10-CM | POA: Diagnosis not present

## 2014-12-04 DIAGNOSIS — N2581 Secondary hyperparathyroidism of renal origin: Secondary | ICD-10-CM | POA: Diagnosis not present

## 2014-12-04 DIAGNOSIS — N186 End stage renal disease: Secondary | ICD-10-CM | POA: Diagnosis not present

## 2014-12-06 DIAGNOSIS — N2581 Secondary hyperparathyroidism of renal origin: Secondary | ICD-10-CM | POA: Diagnosis not present

## 2014-12-06 DIAGNOSIS — N186 End stage renal disease: Secondary | ICD-10-CM | POA: Diagnosis not present

## 2014-12-06 DIAGNOSIS — D631 Anemia in chronic kidney disease: Secondary | ICD-10-CM | POA: Diagnosis not present

## 2014-12-06 DIAGNOSIS — D509 Iron deficiency anemia, unspecified: Secondary | ICD-10-CM | POA: Diagnosis not present

## 2014-12-09 DIAGNOSIS — N2581 Secondary hyperparathyroidism of renal origin: Secondary | ICD-10-CM | POA: Diagnosis not present

## 2014-12-09 DIAGNOSIS — N186 End stage renal disease: Secondary | ICD-10-CM | POA: Diagnosis not present

## 2014-12-09 DIAGNOSIS — D631 Anemia in chronic kidney disease: Secondary | ICD-10-CM | POA: Diagnosis not present

## 2014-12-09 DIAGNOSIS — D509 Iron deficiency anemia, unspecified: Secondary | ICD-10-CM | POA: Diagnosis not present

## 2014-12-10 DIAGNOSIS — N186 End stage renal disease: Secondary | ICD-10-CM | POA: Diagnosis not present

## 2014-12-10 DIAGNOSIS — I120 Hypertensive chronic kidney disease with stage 5 chronic kidney disease or end stage renal disease: Secondary | ICD-10-CM | POA: Diagnosis not present

## 2014-12-10 DIAGNOSIS — Z992 Dependence on renal dialysis: Secondary | ICD-10-CM | POA: Diagnosis not present

## 2014-12-11 DIAGNOSIS — D631 Anemia in chronic kidney disease: Secondary | ICD-10-CM | POA: Diagnosis not present

## 2014-12-11 DIAGNOSIS — N186 End stage renal disease: Secondary | ICD-10-CM | POA: Diagnosis not present

## 2014-12-11 DIAGNOSIS — D509 Iron deficiency anemia, unspecified: Secondary | ICD-10-CM | POA: Diagnosis not present

## 2014-12-11 DIAGNOSIS — N2581 Secondary hyperparathyroidism of renal origin: Secondary | ICD-10-CM | POA: Diagnosis not present

## 2014-12-13 DIAGNOSIS — D509 Iron deficiency anemia, unspecified: Secondary | ICD-10-CM | POA: Diagnosis not present

## 2014-12-13 DIAGNOSIS — N186 End stage renal disease: Secondary | ICD-10-CM | POA: Diagnosis not present

## 2014-12-13 DIAGNOSIS — N2581 Secondary hyperparathyroidism of renal origin: Secondary | ICD-10-CM | POA: Diagnosis not present

## 2014-12-13 DIAGNOSIS — D631 Anemia in chronic kidney disease: Secondary | ICD-10-CM | POA: Diagnosis not present

## 2014-12-16 DIAGNOSIS — D631 Anemia in chronic kidney disease: Secondary | ICD-10-CM | POA: Diagnosis not present

## 2014-12-16 DIAGNOSIS — N186 End stage renal disease: Secondary | ICD-10-CM | POA: Diagnosis not present

## 2014-12-16 DIAGNOSIS — D509 Iron deficiency anemia, unspecified: Secondary | ICD-10-CM | POA: Diagnosis not present

## 2014-12-16 DIAGNOSIS — N2581 Secondary hyperparathyroidism of renal origin: Secondary | ICD-10-CM | POA: Diagnosis not present

## 2014-12-18 DIAGNOSIS — N2581 Secondary hyperparathyroidism of renal origin: Secondary | ICD-10-CM | POA: Diagnosis not present

## 2014-12-18 DIAGNOSIS — D631 Anemia in chronic kidney disease: Secondary | ICD-10-CM | POA: Diagnosis not present

## 2014-12-18 DIAGNOSIS — D509 Iron deficiency anemia, unspecified: Secondary | ICD-10-CM | POA: Diagnosis not present

## 2014-12-18 DIAGNOSIS — N186 End stage renal disease: Secondary | ICD-10-CM | POA: Diagnosis not present

## 2014-12-19 DIAGNOSIS — Z992 Dependence on renal dialysis: Secondary | ICD-10-CM | POA: Diagnosis not present

## 2014-12-19 DIAGNOSIS — N186 End stage renal disease: Secondary | ICD-10-CM | POA: Diagnosis not present

## 2014-12-19 DIAGNOSIS — T82858A Stenosis of vascular prosthetic devices, implants and grafts, initial encounter: Secondary | ICD-10-CM | POA: Diagnosis not present

## 2014-12-19 DIAGNOSIS — I871 Compression of vein: Secondary | ICD-10-CM | POA: Diagnosis not present

## 2014-12-20 DIAGNOSIS — N2581 Secondary hyperparathyroidism of renal origin: Secondary | ICD-10-CM | POA: Diagnosis not present

## 2014-12-20 DIAGNOSIS — N186 End stage renal disease: Secondary | ICD-10-CM | POA: Diagnosis not present

## 2014-12-20 DIAGNOSIS — D509 Iron deficiency anemia, unspecified: Secondary | ICD-10-CM | POA: Diagnosis not present

## 2014-12-20 DIAGNOSIS — D631 Anemia in chronic kidney disease: Secondary | ICD-10-CM | POA: Diagnosis not present

## 2014-12-23 DIAGNOSIS — D631 Anemia in chronic kidney disease: Secondary | ICD-10-CM | POA: Diagnosis not present

## 2014-12-23 DIAGNOSIS — D509 Iron deficiency anemia, unspecified: Secondary | ICD-10-CM | POA: Diagnosis not present

## 2014-12-23 DIAGNOSIS — N186 End stage renal disease: Secondary | ICD-10-CM | POA: Diagnosis not present

## 2014-12-23 DIAGNOSIS — N2581 Secondary hyperparathyroidism of renal origin: Secondary | ICD-10-CM | POA: Diagnosis not present

## 2014-12-25 DIAGNOSIS — N186 End stage renal disease: Secondary | ICD-10-CM | POA: Diagnosis not present

## 2014-12-25 DIAGNOSIS — D509 Iron deficiency anemia, unspecified: Secondary | ICD-10-CM | POA: Diagnosis not present

## 2014-12-25 DIAGNOSIS — N2581 Secondary hyperparathyroidism of renal origin: Secondary | ICD-10-CM | POA: Diagnosis not present

## 2014-12-25 DIAGNOSIS — D631 Anemia in chronic kidney disease: Secondary | ICD-10-CM | POA: Diagnosis not present

## 2014-12-27 DIAGNOSIS — D509 Iron deficiency anemia, unspecified: Secondary | ICD-10-CM | POA: Diagnosis not present

## 2014-12-27 DIAGNOSIS — N186 End stage renal disease: Secondary | ICD-10-CM | POA: Diagnosis not present

## 2014-12-27 DIAGNOSIS — D631 Anemia in chronic kidney disease: Secondary | ICD-10-CM | POA: Diagnosis not present

## 2014-12-27 DIAGNOSIS — N2581 Secondary hyperparathyroidism of renal origin: Secondary | ICD-10-CM | POA: Diagnosis not present

## 2014-12-30 DIAGNOSIS — D509 Iron deficiency anemia, unspecified: Secondary | ICD-10-CM | POA: Diagnosis not present

## 2014-12-30 DIAGNOSIS — N2581 Secondary hyperparathyroidism of renal origin: Secondary | ICD-10-CM | POA: Diagnosis not present

## 2014-12-30 DIAGNOSIS — D631 Anemia in chronic kidney disease: Secondary | ICD-10-CM | POA: Diagnosis not present

## 2014-12-30 DIAGNOSIS — N186 End stage renal disease: Secondary | ICD-10-CM | POA: Diagnosis not present

## 2015-01-01 DIAGNOSIS — N186 End stage renal disease: Secondary | ICD-10-CM | POA: Diagnosis not present

## 2015-01-01 DIAGNOSIS — D631 Anemia in chronic kidney disease: Secondary | ICD-10-CM | POA: Diagnosis not present

## 2015-01-01 DIAGNOSIS — D509 Iron deficiency anemia, unspecified: Secondary | ICD-10-CM | POA: Diagnosis not present

## 2015-01-01 DIAGNOSIS — N2581 Secondary hyperparathyroidism of renal origin: Secondary | ICD-10-CM | POA: Diagnosis not present

## 2015-01-03 DIAGNOSIS — D631 Anemia in chronic kidney disease: Secondary | ICD-10-CM | POA: Diagnosis not present

## 2015-01-03 DIAGNOSIS — N186 End stage renal disease: Secondary | ICD-10-CM | POA: Diagnosis not present

## 2015-01-03 DIAGNOSIS — N2581 Secondary hyperparathyroidism of renal origin: Secondary | ICD-10-CM | POA: Diagnosis not present

## 2015-01-03 DIAGNOSIS — D509 Iron deficiency anemia, unspecified: Secondary | ICD-10-CM | POA: Diagnosis not present

## 2015-01-06 DIAGNOSIS — D509 Iron deficiency anemia, unspecified: Secondary | ICD-10-CM | POA: Diagnosis not present

## 2015-01-06 DIAGNOSIS — F17200 Nicotine dependence, unspecified, uncomplicated: Secondary | ICD-10-CM | POA: Diagnosis not present

## 2015-01-06 DIAGNOSIS — J449 Chronic obstructive pulmonary disease, unspecified: Secondary | ICD-10-CM | POA: Diagnosis not present

## 2015-01-06 DIAGNOSIS — N2581 Secondary hyperparathyroidism of renal origin: Secondary | ICD-10-CM | POA: Diagnosis not present

## 2015-01-06 DIAGNOSIS — I10 Essential (primary) hypertension: Secondary | ICD-10-CM | POA: Diagnosis not present

## 2015-01-06 DIAGNOSIS — D631 Anemia in chronic kidney disease: Secondary | ICD-10-CM | POA: Diagnosis not present

## 2015-01-06 DIAGNOSIS — N186 End stage renal disease: Secondary | ICD-10-CM | POA: Diagnosis not present

## 2015-01-08 DIAGNOSIS — N2581 Secondary hyperparathyroidism of renal origin: Secondary | ICD-10-CM | POA: Diagnosis not present

## 2015-01-08 DIAGNOSIS — D509 Iron deficiency anemia, unspecified: Secondary | ICD-10-CM | POA: Diagnosis not present

## 2015-01-08 DIAGNOSIS — D631 Anemia in chronic kidney disease: Secondary | ICD-10-CM | POA: Diagnosis not present

## 2015-01-08 DIAGNOSIS — N186 End stage renal disease: Secondary | ICD-10-CM | POA: Diagnosis not present

## 2015-01-09 DIAGNOSIS — N186 End stage renal disease: Secondary | ICD-10-CM | POA: Diagnosis not present

## 2015-01-09 DIAGNOSIS — Z992 Dependence on renal dialysis: Secondary | ICD-10-CM | POA: Diagnosis not present

## 2015-01-09 DIAGNOSIS — I120 Hypertensive chronic kidney disease with stage 5 chronic kidney disease or end stage renal disease: Secondary | ICD-10-CM | POA: Diagnosis not present

## 2015-01-10 DIAGNOSIS — N186 End stage renal disease: Secondary | ICD-10-CM | POA: Diagnosis not present

## 2015-01-10 DIAGNOSIS — D631 Anemia in chronic kidney disease: Secondary | ICD-10-CM | POA: Diagnosis not present

## 2015-01-10 DIAGNOSIS — D509 Iron deficiency anemia, unspecified: Secondary | ICD-10-CM | POA: Diagnosis not present

## 2015-01-10 DIAGNOSIS — N2581 Secondary hyperparathyroidism of renal origin: Secondary | ICD-10-CM | POA: Diagnosis not present

## 2015-01-14 DIAGNOSIS — D509 Iron deficiency anemia, unspecified: Secondary | ICD-10-CM | POA: Diagnosis not present

## 2015-01-14 DIAGNOSIS — N186 End stage renal disease: Secondary | ICD-10-CM | POA: Diagnosis not present

## 2015-01-14 DIAGNOSIS — N2581 Secondary hyperparathyroidism of renal origin: Secondary | ICD-10-CM | POA: Diagnosis not present

## 2015-01-14 DIAGNOSIS — D631 Anemia in chronic kidney disease: Secondary | ICD-10-CM | POA: Diagnosis not present

## 2015-01-15 DIAGNOSIS — D509 Iron deficiency anemia, unspecified: Secondary | ICD-10-CM | POA: Diagnosis not present

## 2015-01-15 DIAGNOSIS — D631 Anemia in chronic kidney disease: Secondary | ICD-10-CM | POA: Diagnosis not present

## 2015-01-15 DIAGNOSIS — N2581 Secondary hyperparathyroidism of renal origin: Secondary | ICD-10-CM | POA: Diagnosis not present

## 2015-01-15 DIAGNOSIS — N186 End stage renal disease: Secondary | ICD-10-CM | POA: Diagnosis not present

## 2015-01-17 DIAGNOSIS — N186 End stage renal disease: Secondary | ICD-10-CM | POA: Diagnosis not present

## 2015-01-17 DIAGNOSIS — D631 Anemia in chronic kidney disease: Secondary | ICD-10-CM | POA: Diagnosis not present

## 2015-01-17 DIAGNOSIS — D509 Iron deficiency anemia, unspecified: Secondary | ICD-10-CM | POA: Diagnosis not present

## 2015-01-17 DIAGNOSIS — N2581 Secondary hyperparathyroidism of renal origin: Secondary | ICD-10-CM | POA: Diagnosis not present

## 2015-01-20 DIAGNOSIS — D509 Iron deficiency anemia, unspecified: Secondary | ICD-10-CM | POA: Diagnosis not present

## 2015-01-20 DIAGNOSIS — N186 End stage renal disease: Secondary | ICD-10-CM | POA: Diagnosis not present

## 2015-01-20 DIAGNOSIS — N2581 Secondary hyperparathyroidism of renal origin: Secondary | ICD-10-CM | POA: Diagnosis not present

## 2015-01-20 DIAGNOSIS — D631 Anemia in chronic kidney disease: Secondary | ICD-10-CM | POA: Diagnosis not present

## 2015-01-22 DIAGNOSIS — N186 End stage renal disease: Secondary | ICD-10-CM | POA: Diagnosis not present

## 2015-01-22 DIAGNOSIS — D509 Iron deficiency anemia, unspecified: Secondary | ICD-10-CM | POA: Diagnosis not present

## 2015-01-22 DIAGNOSIS — N2581 Secondary hyperparathyroidism of renal origin: Secondary | ICD-10-CM | POA: Diagnosis not present

## 2015-01-22 DIAGNOSIS — D631 Anemia in chronic kidney disease: Secondary | ICD-10-CM | POA: Diagnosis not present

## 2015-01-24 DIAGNOSIS — D509 Iron deficiency anemia, unspecified: Secondary | ICD-10-CM | POA: Diagnosis not present

## 2015-01-24 DIAGNOSIS — D631 Anemia in chronic kidney disease: Secondary | ICD-10-CM | POA: Diagnosis not present

## 2015-01-24 DIAGNOSIS — N186 End stage renal disease: Secondary | ICD-10-CM | POA: Diagnosis not present

## 2015-01-24 DIAGNOSIS — N2581 Secondary hyperparathyroidism of renal origin: Secondary | ICD-10-CM | POA: Diagnosis not present

## 2015-01-27 DIAGNOSIS — D631 Anemia in chronic kidney disease: Secondary | ICD-10-CM | POA: Diagnosis not present

## 2015-01-27 DIAGNOSIS — N186 End stage renal disease: Secondary | ICD-10-CM | POA: Diagnosis not present

## 2015-01-27 DIAGNOSIS — N2581 Secondary hyperparathyroidism of renal origin: Secondary | ICD-10-CM | POA: Diagnosis not present

## 2015-01-27 DIAGNOSIS — D509 Iron deficiency anemia, unspecified: Secondary | ICD-10-CM | POA: Diagnosis not present

## 2015-01-29 DIAGNOSIS — D509 Iron deficiency anemia, unspecified: Secondary | ICD-10-CM | POA: Diagnosis not present

## 2015-01-29 DIAGNOSIS — D631 Anemia in chronic kidney disease: Secondary | ICD-10-CM | POA: Diagnosis not present

## 2015-01-29 DIAGNOSIS — N2581 Secondary hyperparathyroidism of renal origin: Secondary | ICD-10-CM | POA: Diagnosis not present

## 2015-01-29 DIAGNOSIS — N186 End stage renal disease: Secondary | ICD-10-CM | POA: Diagnosis not present

## 2015-01-31 DIAGNOSIS — N186 End stage renal disease: Secondary | ICD-10-CM | POA: Diagnosis not present

## 2015-01-31 DIAGNOSIS — D631 Anemia in chronic kidney disease: Secondary | ICD-10-CM | POA: Diagnosis not present

## 2015-01-31 DIAGNOSIS — N2581 Secondary hyperparathyroidism of renal origin: Secondary | ICD-10-CM | POA: Diagnosis not present

## 2015-01-31 DIAGNOSIS — D509 Iron deficiency anemia, unspecified: Secondary | ICD-10-CM | POA: Diagnosis not present

## 2015-02-03 DIAGNOSIS — D631 Anemia in chronic kidney disease: Secondary | ICD-10-CM | POA: Diagnosis not present

## 2015-02-03 DIAGNOSIS — D509 Iron deficiency anemia, unspecified: Secondary | ICD-10-CM | POA: Diagnosis not present

## 2015-02-03 DIAGNOSIS — N2581 Secondary hyperparathyroidism of renal origin: Secondary | ICD-10-CM | POA: Diagnosis not present

## 2015-02-03 DIAGNOSIS — N186 End stage renal disease: Secondary | ICD-10-CM | POA: Diagnosis not present

## 2015-02-04 IMAGING — CR DG CHEST 1V PORT
1 series · 1 of 1 positions shown · non-contrast
Comparison: DG CHEST 2 VIEW dated 07/09/2011

CLINICAL DATA: Dyspnea

EXAM:
PORTABLE CHEST - 1 VIEW

[AP]
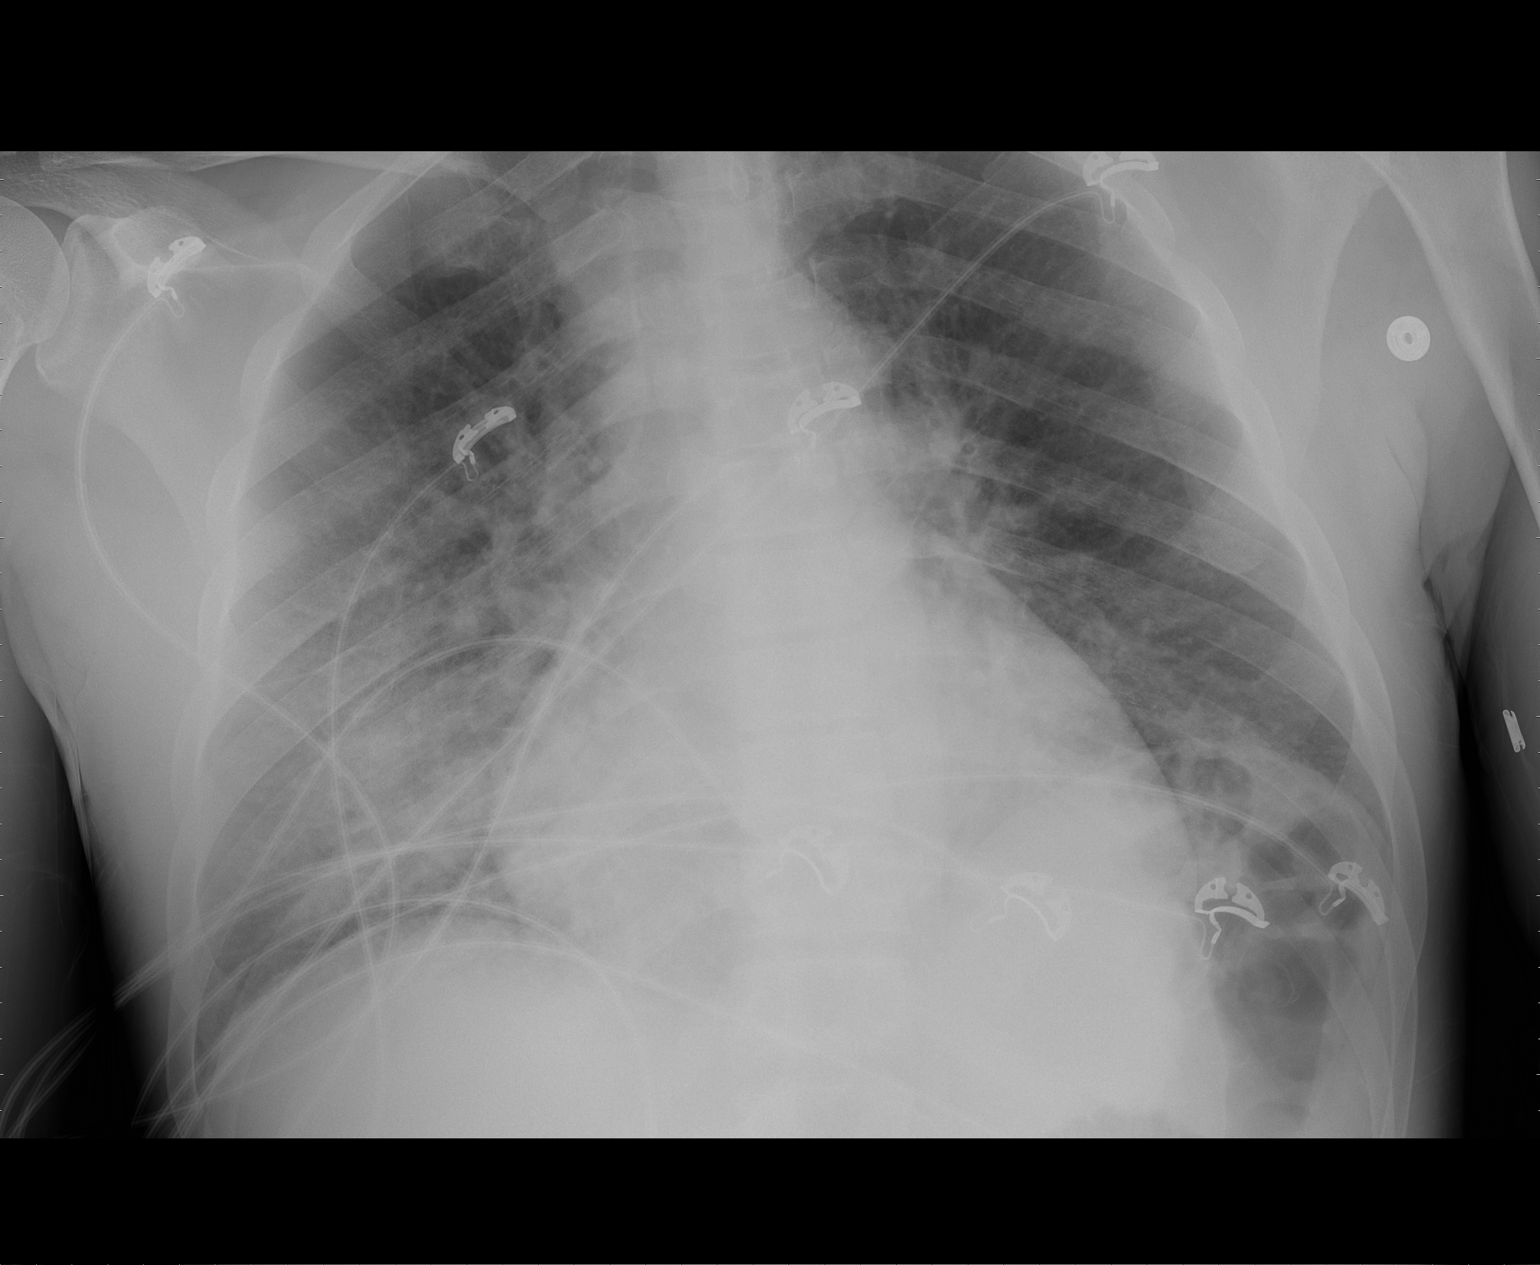

[1 of 1 positions shown; findings below may reference images not displayed]

FINDINGS: Patchy airspace opacities throughout the right mid lung, right lower
lung, and left base have developed. Cardiac silhouette is prominent
likely due to AP technique. No pneumothorax.
IMPRESSION: Bilateral airspace disease.

## 2015-02-05 DIAGNOSIS — N2581 Secondary hyperparathyroidism of renal origin: Secondary | ICD-10-CM | POA: Diagnosis not present

## 2015-02-05 DIAGNOSIS — D631 Anemia in chronic kidney disease: Secondary | ICD-10-CM | POA: Diagnosis not present

## 2015-02-05 DIAGNOSIS — N186 End stage renal disease: Secondary | ICD-10-CM | POA: Diagnosis not present

## 2015-02-05 DIAGNOSIS — D509 Iron deficiency anemia, unspecified: Secondary | ICD-10-CM | POA: Diagnosis not present

## 2015-02-06 DIAGNOSIS — H00011 Hordeolum externum right upper eyelid: Secondary | ICD-10-CM | POA: Diagnosis not present

## 2015-02-06 DIAGNOSIS — H0011 Chalazion right upper eyelid: Secondary | ICD-10-CM | POA: Diagnosis not present

## 2015-02-07 DIAGNOSIS — D631 Anemia in chronic kidney disease: Secondary | ICD-10-CM | POA: Diagnosis not present

## 2015-02-07 DIAGNOSIS — N186 End stage renal disease: Secondary | ICD-10-CM | POA: Diagnosis not present

## 2015-02-07 DIAGNOSIS — D509 Iron deficiency anemia, unspecified: Secondary | ICD-10-CM | POA: Diagnosis not present

## 2015-02-07 DIAGNOSIS — N2581 Secondary hyperparathyroidism of renal origin: Secondary | ICD-10-CM | POA: Diagnosis not present

## 2015-02-09 DIAGNOSIS — I120 Hypertensive chronic kidney disease with stage 5 chronic kidney disease or end stage renal disease: Secondary | ICD-10-CM | POA: Diagnosis not present

## 2015-02-09 DIAGNOSIS — N186 End stage renal disease: Secondary | ICD-10-CM | POA: Diagnosis not present

## 2015-02-09 DIAGNOSIS — Z992 Dependence on renal dialysis: Secondary | ICD-10-CM | POA: Diagnosis not present

## 2015-02-10 DIAGNOSIS — D509 Iron deficiency anemia, unspecified: Secondary | ICD-10-CM | POA: Diagnosis not present

## 2015-02-10 DIAGNOSIS — D631 Anemia in chronic kidney disease: Secondary | ICD-10-CM | POA: Diagnosis not present

## 2015-02-10 DIAGNOSIS — N2581 Secondary hyperparathyroidism of renal origin: Secondary | ICD-10-CM | POA: Diagnosis not present

## 2015-02-10 DIAGNOSIS — N186 End stage renal disease: Secondary | ICD-10-CM | POA: Diagnosis not present

## 2015-02-12 DIAGNOSIS — N186 End stage renal disease: Secondary | ICD-10-CM | POA: Diagnosis not present

## 2015-02-12 DIAGNOSIS — D509 Iron deficiency anemia, unspecified: Secondary | ICD-10-CM | POA: Diagnosis not present

## 2015-02-12 DIAGNOSIS — N2581 Secondary hyperparathyroidism of renal origin: Secondary | ICD-10-CM | POA: Diagnosis not present

## 2015-02-12 DIAGNOSIS — D631 Anemia in chronic kidney disease: Secondary | ICD-10-CM | POA: Diagnosis not present

## 2015-02-14 DIAGNOSIS — D509 Iron deficiency anemia, unspecified: Secondary | ICD-10-CM | POA: Diagnosis not present

## 2015-02-14 DIAGNOSIS — N2581 Secondary hyperparathyroidism of renal origin: Secondary | ICD-10-CM | POA: Diagnosis not present

## 2015-02-14 DIAGNOSIS — N186 End stage renal disease: Secondary | ICD-10-CM | POA: Diagnosis not present

## 2015-02-14 DIAGNOSIS — D631 Anemia in chronic kidney disease: Secondary | ICD-10-CM | POA: Diagnosis not present

## 2015-02-17 DIAGNOSIS — D509 Iron deficiency anemia, unspecified: Secondary | ICD-10-CM | POA: Diagnosis not present

## 2015-02-17 DIAGNOSIS — N186 End stage renal disease: Secondary | ICD-10-CM | POA: Diagnosis not present

## 2015-02-17 DIAGNOSIS — N2581 Secondary hyperparathyroidism of renal origin: Secondary | ICD-10-CM | POA: Diagnosis not present

## 2015-02-17 DIAGNOSIS — D631 Anemia in chronic kidney disease: Secondary | ICD-10-CM | POA: Diagnosis not present

## 2015-02-19 DIAGNOSIS — D631 Anemia in chronic kidney disease: Secondary | ICD-10-CM | POA: Diagnosis not present

## 2015-02-19 DIAGNOSIS — N2581 Secondary hyperparathyroidism of renal origin: Secondary | ICD-10-CM | POA: Diagnosis not present

## 2015-02-19 DIAGNOSIS — D509 Iron deficiency anemia, unspecified: Secondary | ICD-10-CM | POA: Diagnosis not present

## 2015-02-19 DIAGNOSIS — N186 End stage renal disease: Secondary | ICD-10-CM | POA: Diagnosis not present

## 2015-02-21 DIAGNOSIS — N186 End stage renal disease: Secondary | ICD-10-CM | POA: Diagnosis not present

## 2015-02-21 DIAGNOSIS — N2581 Secondary hyperparathyroidism of renal origin: Secondary | ICD-10-CM | POA: Diagnosis not present

## 2015-02-21 DIAGNOSIS — D631 Anemia in chronic kidney disease: Secondary | ICD-10-CM | POA: Diagnosis not present

## 2015-02-21 DIAGNOSIS — D509 Iron deficiency anemia, unspecified: Secondary | ICD-10-CM | POA: Diagnosis not present

## 2015-02-24 DIAGNOSIS — N2581 Secondary hyperparathyroidism of renal origin: Secondary | ICD-10-CM | POA: Diagnosis not present

## 2015-02-24 DIAGNOSIS — N186 End stage renal disease: Secondary | ICD-10-CM | POA: Diagnosis not present

## 2015-02-24 DIAGNOSIS — D509 Iron deficiency anemia, unspecified: Secondary | ICD-10-CM | POA: Diagnosis not present

## 2015-02-24 DIAGNOSIS — D631 Anemia in chronic kidney disease: Secondary | ICD-10-CM | POA: Diagnosis not present

## 2015-02-26 DIAGNOSIS — N186 End stage renal disease: Secondary | ICD-10-CM | POA: Diagnosis not present

## 2015-02-26 DIAGNOSIS — D631 Anemia in chronic kidney disease: Secondary | ICD-10-CM | POA: Diagnosis not present

## 2015-02-26 DIAGNOSIS — D509 Iron deficiency anemia, unspecified: Secondary | ICD-10-CM | POA: Diagnosis not present

## 2015-02-26 DIAGNOSIS — N2581 Secondary hyperparathyroidism of renal origin: Secondary | ICD-10-CM | POA: Diagnosis not present

## 2015-02-28 DIAGNOSIS — N186 End stage renal disease: Secondary | ICD-10-CM | POA: Diagnosis not present

## 2015-02-28 DIAGNOSIS — D631 Anemia in chronic kidney disease: Secondary | ICD-10-CM | POA: Diagnosis not present

## 2015-02-28 DIAGNOSIS — D509 Iron deficiency anemia, unspecified: Secondary | ICD-10-CM | POA: Diagnosis not present

## 2015-02-28 DIAGNOSIS — I10 Essential (primary) hypertension: Secondary | ICD-10-CM | POA: Diagnosis not present

## 2015-02-28 DIAGNOSIS — M25519 Pain in unspecified shoulder: Secondary | ICD-10-CM | POA: Diagnosis not present

## 2015-02-28 DIAGNOSIS — J449 Chronic obstructive pulmonary disease, unspecified: Secondary | ICD-10-CM | POA: Diagnosis not present

## 2015-02-28 DIAGNOSIS — N2581 Secondary hyperparathyroidism of renal origin: Secondary | ICD-10-CM | POA: Diagnosis not present

## 2015-02-28 DIAGNOSIS — F17200 Nicotine dependence, unspecified, uncomplicated: Secondary | ICD-10-CM | POA: Diagnosis not present

## 2015-02-28 DIAGNOSIS — L03019 Cellulitis of unspecified finger: Secondary | ICD-10-CM | POA: Diagnosis not present

## 2015-03-03 DIAGNOSIS — D509 Iron deficiency anemia, unspecified: Secondary | ICD-10-CM | POA: Diagnosis not present

## 2015-03-03 DIAGNOSIS — D631 Anemia in chronic kidney disease: Secondary | ICD-10-CM | POA: Diagnosis not present

## 2015-03-03 DIAGNOSIS — N186 End stage renal disease: Secondary | ICD-10-CM | POA: Diagnosis not present

## 2015-03-03 DIAGNOSIS — N2581 Secondary hyperparathyroidism of renal origin: Secondary | ICD-10-CM | POA: Diagnosis not present

## 2015-03-05 DIAGNOSIS — N2581 Secondary hyperparathyroidism of renal origin: Secondary | ICD-10-CM | POA: Diagnosis not present

## 2015-03-05 DIAGNOSIS — N186 End stage renal disease: Secondary | ICD-10-CM | POA: Diagnosis not present

## 2015-03-05 DIAGNOSIS — D631 Anemia in chronic kidney disease: Secondary | ICD-10-CM | POA: Diagnosis not present

## 2015-03-05 DIAGNOSIS — D509 Iron deficiency anemia, unspecified: Secondary | ICD-10-CM | POA: Diagnosis not present

## 2015-03-07 DIAGNOSIS — N186 End stage renal disease: Secondary | ICD-10-CM | POA: Diagnosis not present

## 2015-03-07 DIAGNOSIS — N2581 Secondary hyperparathyroidism of renal origin: Secondary | ICD-10-CM | POA: Diagnosis not present

## 2015-03-07 DIAGNOSIS — D631 Anemia in chronic kidney disease: Secondary | ICD-10-CM | POA: Diagnosis not present

## 2015-03-07 DIAGNOSIS — D509 Iron deficiency anemia, unspecified: Secondary | ICD-10-CM | POA: Diagnosis not present

## 2015-03-10 DIAGNOSIS — D509 Iron deficiency anemia, unspecified: Secondary | ICD-10-CM | POA: Diagnosis not present

## 2015-03-10 DIAGNOSIS — N2581 Secondary hyperparathyroidism of renal origin: Secondary | ICD-10-CM | POA: Diagnosis not present

## 2015-03-10 DIAGNOSIS — D631 Anemia in chronic kidney disease: Secondary | ICD-10-CM | POA: Diagnosis not present

## 2015-03-10 DIAGNOSIS — N186 End stage renal disease: Secondary | ICD-10-CM | POA: Diagnosis not present

## 2015-03-12 DIAGNOSIS — Z992 Dependence on renal dialysis: Secondary | ICD-10-CM | POA: Diagnosis not present

## 2015-03-12 DIAGNOSIS — N186 End stage renal disease: Secondary | ICD-10-CM | POA: Diagnosis not present

## 2015-03-12 DIAGNOSIS — N2581 Secondary hyperparathyroidism of renal origin: Secondary | ICD-10-CM | POA: Diagnosis not present

## 2015-03-12 DIAGNOSIS — D631 Anemia in chronic kidney disease: Secondary | ICD-10-CM | POA: Diagnosis not present

## 2015-03-12 DIAGNOSIS — I120 Hypertensive chronic kidney disease with stage 5 chronic kidney disease or end stage renal disease: Secondary | ICD-10-CM | POA: Diagnosis not present

## 2015-03-12 DIAGNOSIS — D509 Iron deficiency anemia, unspecified: Secondary | ICD-10-CM | POA: Diagnosis not present

## 2015-03-14 DIAGNOSIS — D631 Anemia in chronic kidney disease: Secondary | ICD-10-CM | POA: Diagnosis not present

## 2015-03-14 DIAGNOSIS — N186 End stage renal disease: Secondary | ICD-10-CM | POA: Diagnosis not present

## 2015-03-14 DIAGNOSIS — N2581 Secondary hyperparathyroidism of renal origin: Secondary | ICD-10-CM | POA: Diagnosis not present

## 2015-03-14 DIAGNOSIS — D509 Iron deficiency anemia, unspecified: Secondary | ICD-10-CM | POA: Diagnosis not present

## 2015-03-26 DIAGNOSIS — F17200 Nicotine dependence, unspecified, uncomplicated: Secondary | ICD-10-CM | POA: Diagnosis not present

## 2015-03-26 DIAGNOSIS — N186 End stage renal disease: Secondary | ICD-10-CM | POA: Diagnosis not present

## 2015-03-26 DIAGNOSIS — M25519 Pain in unspecified shoulder: Secondary | ICD-10-CM | POA: Diagnosis not present

## 2015-03-26 DIAGNOSIS — I10 Essential (primary) hypertension: Secondary | ICD-10-CM | POA: Diagnosis not present

## 2015-03-26 DIAGNOSIS — J449 Chronic obstructive pulmonary disease, unspecified: Secondary | ICD-10-CM | POA: Diagnosis not present

## 2015-04-11 DIAGNOSIS — Z992 Dependence on renal dialysis: Secondary | ICD-10-CM | POA: Diagnosis not present

## 2015-04-11 DIAGNOSIS — N186 End stage renal disease: Secondary | ICD-10-CM | POA: Diagnosis not present

## 2015-04-11 DIAGNOSIS — I120 Hypertensive chronic kidney disease with stage 5 chronic kidney disease or end stage renal disease: Secondary | ICD-10-CM | POA: Diagnosis not present

## 2015-04-14 DIAGNOSIS — D631 Anemia in chronic kidney disease: Secondary | ICD-10-CM | POA: Diagnosis not present

## 2015-04-14 DIAGNOSIS — N2581 Secondary hyperparathyroidism of renal origin: Secondary | ICD-10-CM | POA: Diagnosis not present

## 2015-04-14 DIAGNOSIS — D509 Iron deficiency anemia, unspecified: Secondary | ICD-10-CM | POA: Diagnosis not present

## 2015-04-14 DIAGNOSIS — N186 End stage renal disease: Secondary | ICD-10-CM | POA: Diagnosis not present

## 2015-04-14 DIAGNOSIS — Z23 Encounter for immunization: Secondary | ICD-10-CM | POA: Diagnosis not present

## 2015-04-16 DIAGNOSIS — N186 End stage renal disease: Secondary | ICD-10-CM | POA: Diagnosis not present

## 2015-04-16 DIAGNOSIS — D631 Anemia in chronic kidney disease: Secondary | ICD-10-CM | POA: Diagnosis not present

## 2015-04-16 DIAGNOSIS — Z23 Encounter for immunization: Secondary | ICD-10-CM | POA: Diagnosis not present

## 2015-04-16 DIAGNOSIS — D509 Iron deficiency anemia, unspecified: Secondary | ICD-10-CM | POA: Diagnosis not present

## 2015-04-16 DIAGNOSIS — N2581 Secondary hyperparathyroidism of renal origin: Secondary | ICD-10-CM | POA: Diagnosis not present

## 2015-04-18 DIAGNOSIS — Z23 Encounter for immunization: Secondary | ICD-10-CM | POA: Diagnosis not present

## 2015-04-18 DIAGNOSIS — D509 Iron deficiency anemia, unspecified: Secondary | ICD-10-CM | POA: Diagnosis not present

## 2015-04-18 DIAGNOSIS — N2581 Secondary hyperparathyroidism of renal origin: Secondary | ICD-10-CM | POA: Diagnosis not present

## 2015-04-18 DIAGNOSIS — D631 Anemia in chronic kidney disease: Secondary | ICD-10-CM | POA: Diagnosis not present

## 2015-04-18 DIAGNOSIS — N186 End stage renal disease: Secondary | ICD-10-CM | POA: Diagnosis not present

## 2015-04-21 DIAGNOSIS — Z23 Encounter for immunization: Secondary | ICD-10-CM | POA: Diagnosis not present

## 2015-04-21 DIAGNOSIS — D509 Iron deficiency anemia, unspecified: Secondary | ICD-10-CM | POA: Diagnosis not present

## 2015-04-21 DIAGNOSIS — N186 End stage renal disease: Secondary | ICD-10-CM | POA: Diagnosis not present

## 2015-04-21 DIAGNOSIS — D631 Anemia in chronic kidney disease: Secondary | ICD-10-CM | POA: Diagnosis not present

## 2015-04-21 DIAGNOSIS — N2581 Secondary hyperparathyroidism of renal origin: Secondary | ICD-10-CM | POA: Diagnosis not present

## 2015-04-22 DIAGNOSIS — M25512 Pain in left shoulder: Secondary | ICD-10-CM | POA: Diagnosis not present

## 2015-04-23 DIAGNOSIS — Z23 Encounter for immunization: Secondary | ICD-10-CM | POA: Diagnosis not present

## 2015-04-23 DIAGNOSIS — N186 End stage renal disease: Secondary | ICD-10-CM | POA: Diagnosis not present

## 2015-04-23 DIAGNOSIS — D509 Iron deficiency anemia, unspecified: Secondary | ICD-10-CM | POA: Diagnosis not present

## 2015-04-23 DIAGNOSIS — N2581 Secondary hyperparathyroidism of renal origin: Secondary | ICD-10-CM | POA: Diagnosis not present

## 2015-04-23 DIAGNOSIS — D631 Anemia in chronic kidney disease: Secondary | ICD-10-CM | POA: Diagnosis not present

## 2015-04-25 DIAGNOSIS — D509 Iron deficiency anemia, unspecified: Secondary | ICD-10-CM | POA: Diagnosis not present

## 2015-04-25 DIAGNOSIS — N186 End stage renal disease: Secondary | ICD-10-CM | POA: Diagnosis not present

## 2015-04-25 DIAGNOSIS — N2581 Secondary hyperparathyroidism of renal origin: Secondary | ICD-10-CM | POA: Diagnosis not present

## 2015-04-25 DIAGNOSIS — D631 Anemia in chronic kidney disease: Secondary | ICD-10-CM | POA: Diagnosis not present

## 2015-04-25 DIAGNOSIS — Z23 Encounter for immunization: Secondary | ICD-10-CM | POA: Diagnosis not present

## 2015-04-28 DIAGNOSIS — Z23 Encounter for immunization: Secondary | ICD-10-CM | POA: Diagnosis not present

## 2015-04-28 DIAGNOSIS — D631 Anemia in chronic kidney disease: Secondary | ICD-10-CM | POA: Diagnosis not present

## 2015-04-28 DIAGNOSIS — N186 End stage renal disease: Secondary | ICD-10-CM | POA: Diagnosis not present

## 2015-04-28 DIAGNOSIS — D509 Iron deficiency anemia, unspecified: Secondary | ICD-10-CM | POA: Diagnosis not present

## 2015-04-28 DIAGNOSIS — N2581 Secondary hyperparathyroidism of renal origin: Secondary | ICD-10-CM | POA: Diagnosis not present

## 2015-04-30 DIAGNOSIS — D631 Anemia in chronic kidney disease: Secondary | ICD-10-CM | POA: Diagnosis not present

## 2015-04-30 DIAGNOSIS — D509 Iron deficiency anemia, unspecified: Secondary | ICD-10-CM | POA: Diagnosis not present

## 2015-04-30 DIAGNOSIS — N186 End stage renal disease: Secondary | ICD-10-CM | POA: Diagnosis not present

## 2015-04-30 DIAGNOSIS — N2581 Secondary hyperparathyroidism of renal origin: Secondary | ICD-10-CM | POA: Diagnosis not present

## 2015-04-30 DIAGNOSIS — Z23 Encounter for immunization: Secondary | ICD-10-CM | POA: Diagnosis not present

## 2015-05-02 DIAGNOSIS — D631 Anemia in chronic kidney disease: Secondary | ICD-10-CM | POA: Diagnosis not present

## 2015-05-02 DIAGNOSIS — N186 End stage renal disease: Secondary | ICD-10-CM | POA: Diagnosis not present

## 2015-05-02 DIAGNOSIS — Z23 Encounter for immunization: Secondary | ICD-10-CM | POA: Diagnosis not present

## 2015-05-02 DIAGNOSIS — N2581 Secondary hyperparathyroidism of renal origin: Secondary | ICD-10-CM | POA: Diagnosis not present

## 2015-05-02 DIAGNOSIS — D509 Iron deficiency anemia, unspecified: Secondary | ICD-10-CM | POA: Diagnosis not present

## 2015-05-05 DIAGNOSIS — D509 Iron deficiency anemia, unspecified: Secondary | ICD-10-CM | POA: Diagnosis not present

## 2015-05-05 DIAGNOSIS — Z23 Encounter for immunization: Secondary | ICD-10-CM | POA: Diagnosis not present

## 2015-05-05 DIAGNOSIS — D631 Anemia in chronic kidney disease: Secondary | ICD-10-CM | POA: Diagnosis not present

## 2015-05-05 DIAGNOSIS — N2581 Secondary hyperparathyroidism of renal origin: Secondary | ICD-10-CM | POA: Diagnosis not present

## 2015-05-05 DIAGNOSIS — N186 End stage renal disease: Secondary | ICD-10-CM | POA: Diagnosis not present

## 2015-05-07 DIAGNOSIS — N2581 Secondary hyperparathyroidism of renal origin: Secondary | ICD-10-CM | POA: Diagnosis not present

## 2015-05-07 DIAGNOSIS — Z23 Encounter for immunization: Secondary | ICD-10-CM | POA: Diagnosis not present

## 2015-05-07 DIAGNOSIS — N186 End stage renal disease: Secondary | ICD-10-CM | POA: Diagnosis not present

## 2015-05-07 DIAGNOSIS — J449 Chronic obstructive pulmonary disease, unspecified: Secondary | ICD-10-CM | POA: Diagnosis not present

## 2015-05-07 DIAGNOSIS — M25519 Pain in unspecified shoulder: Secondary | ICD-10-CM | POA: Diagnosis not present

## 2015-05-07 DIAGNOSIS — F17200 Nicotine dependence, unspecified, uncomplicated: Secondary | ICD-10-CM | POA: Diagnosis not present

## 2015-05-07 DIAGNOSIS — N529 Male erectile dysfunction, unspecified: Secondary | ICD-10-CM | POA: Diagnosis not present

## 2015-05-07 DIAGNOSIS — D631 Anemia in chronic kidney disease: Secondary | ICD-10-CM | POA: Diagnosis not present

## 2015-05-07 DIAGNOSIS — D509 Iron deficiency anemia, unspecified: Secondary | ICD-10-CM | POA: Diagnosis not present

## 2015-05-07 DIAGNOSIS — I10 Essential (primary) hypertension: Secondary | ICD-10-CM | POA: Diagnosis not present

## 2015-05-09 DIAGNOSIS — N2581 Secondary hyperparathyroidism of renal origin: Secondary | ICD-10-CM | POA: Diagnosis not present

## 2015-05-09 DIAGNOSIS — Z23 Encounter for immunization: Secondary | ICD-10-CM | POA: Diagnosis not present

## 2015-05-09 DIAGNOSIS — N186 End stage renal disease: Secondary | ICD-10-CM | POA: Diagnosis not present

## 2015-05-09 DIAGNOSIS — D509 Iron deficiency anemia, unspecified: Secondary | ICD-10-CM | POA: Diagnosis not present

## 2015-05-09 DIAGNOSIS — D631 Anemia in chronic kidney disease: Secondary | ICD-10-CM | POA: Diagnosis not present

## 2015-05-12 DIAGNOSIS — D509 Iron deficiency anemia, unspecified: Secondary | ICD-10-CM | POA: Diagnosis not present

## 2015-05-12 DIAGNOSIS — I120 Hypertensive chronic kidney disease with stage 5 chronic kidney disease or end stage renal disease: Secondary | ICD-10-CM | POA: Diagnosis not present

## 2015-05-12 DIAGNOSIS — D631 Anemia in chronic kidney disease: Secondary | ICD-10-CM | POA: Diagnosis not present

## 2015-05-12 DIAGNOSIS — Z23 Encounter for immunization: Secondary | ICD-10-CM | POA: Diagnosis not present

## 2015-05-12 DIAGNOSIS — Z992 Dependence on renal dialysis: Secondary | ICD-10-CM | POA: Diagnosis not present

## 2015-05-12 DIAGNOSIS — N186 End stage renal disease: Secondary | ICD-10-CM | POA: Diagnosis not present

## 2015-05-12 DIAGNOSIS — N2581 Secondary hyperparathyroidism of renal origin: Secondary | ICD-10-CM | POA: Diagnosis not present

## 2015-05-14 DIAGNOSIS — N2581 Secondary hyperparathyroidism of renal origin: Secondary | ICD-10-CM | POA: Diagnosis not present

## 2015-05-14 DIAGNOSIS — D631 Anemia in chronic kidney disease: Secondary | ICD-10-CM | POA: Diagnosis not present

## 2015-05-14 DIAGNOSIS — N186 End stage renal disease: Secondary | ICD-10-CM | POA: Diagnosis not present

## 2015-05-14 DIAGNOSIS — D509 Iron deficiency anemia, unspecified: Secondary | ICD-10-CM | POA: Diagnosis not present

## 2015-06-02 DIAGNOSIS — F17200 Nicotine dependence, unspecified, uncomplicated: Secondary | ICD-10-CM | POA: Diagnosis not present

## 2015-06-02 DIAGNOSIS — I10 Essential (primary) hypertension: Secondary | ICD-10-CM | POA: Diagnosis not present

## 2015-06-02 DIAGNOSIS — J449 Chronic obstructive pulmonary disease, unspecified: Secondary | ICD-10-CM | POA: Diagnosis not present

## 2015-06-02 DIAGNOSIS — M25519 Pain in unspecified shoulder: Secondary | ICD-10-CM | POA: Diagnosis not present

## 2015-06-02 DIAGNOSIS — N186 End stage renal disease: Secondary | ICD-10-CM | POA: Diagnosis not present

## 2015-06-11 DIAGNOSIS — N186 End stage renal disease: Secondary | ICD-10-CM | POA: Diagnosis not present

## 2015-06-11 DIAGNOSIS — Z992 Dependence on renal dialysis: Secondary | ICD-10-CM | POA: Diagnosis not present

## 2015-06-11 DIAGNOSIS — I120 Hypertensive chronic kidney disease with stage 5 chronic kidney disease or end stage renal disease: Secondary | ICD-10-CM | POA: Diagnosis not present

## 2015-06-13 DIAGNOSIS — N186 End stage renal disease: Secondary | ICD-10-CM | POA: Diagnosis not present

## 2015-06-13 DIAGNOSIS — N2581 Secondary hyperparathyroidism of renal origin: Secondary | ICD-10-CM | POA: Diagnosis not present

## 2015-06-13 DIAGNOSIS — D509 Iron deficiency anemia, unspecified: Secondary | ICD-10-CM | POA: Diagnosis not present

## 2015-06-13 DIAGNOSIS — D631 Anemia in chronic kidney disease: Secondary | ICD-10-CM | POA: Diagnosis not present

## 2015-06-16 DIAGNOSIS — D509 Iron deficiency anemia, unspecified: Secondary | ICD-10-CM | POA: Diagnosis not present

## 2015-06-16 DIAGNOSIS — D631 Anemia in chronic kidney disease: Secondary | ICD-10-CM | POA: Diagnosis not present

## 2015-06-16 DIAGNOSIS — N2581 Secondary hyperparathyroidism of renal origin: Secondary | ICD-10-CM | POA: Diagnosis not present

## 2015-06-16 DIAGNOSIS — N186 End stage renal disease: Secondary | ICD-10-CM | POA: Diagnosis not present

## 2015-06-18 DIAGNOSIS — N2581 Secondary hyperparathyroidism of renal origin: Secondary | ICD-10-CM | POA: Diagnosis not present

## 2015-06-18 DIAGNOSIS — N186 End stage renal disease: Secondary | ICD-10-CM | POA: Diagnosis not present

## 2015-06-18 DIAGNOSIS — D509 Iron deficiency anemia, unspecified: Secondary | ICD-10-CM | POA: Diagnosis not present

## 2015-06-18 DIAGNOSIS — D631 Anemia in chronic kidney disease: Secondary | ICD-10-CM | POA: Diagnosis not present

## 2015-06-20 DIAGNOSIS — D631 Anemia in chronic kidney disease: Secondary | ICD-10-CM | POA: Diagnosis not present

## 2015-06-20 DIAGNOSIS — N2581 Secondary hyperparathyroidism of renal origin: Secondary | ICD-10-CM | POA: Diagnosis not present

## 2015-06-20 DIAGNOSIS — N186 End stage renal disease: Secondary | ICD-10-CM | POA: Diagnosis not present

## 2015-06-20 DIAGNOSIS — D509 Iron deficiency anemia, unspecified: Secondary | ICD-10-CM | POA: Diagnosis not present

## 2015-06-23 DIAGNOSIS — D631 Anemia in chronic kidney disease: Secondary | ICD-10-CM | POA: Diagnosis not present

## 2015-06-23 DIAGNOSIS — N2581 Secondary hyperparathyroidism of renal origin: Secondary | ICD-10-CM | POA: Diagnosis not present

## 2015-06-23 DIAGNOSIS — N186 End stage renal disease: Secondary | ICD-10-CM | POA: Diagnosis not present

## 2015-06-23 DIAGNOSIS — D509 Iron deficiency anemia, unspecified: Secondary | ICD-10-CM | POA: Diagnosis not present

## 2015-06-25 DIAGNOSIS — D631 Anemia in chronic kidney disease: Secondary | ICD-10-CM | POA: Diagnosis not present

## 2015-06-25 DIAGNOSIS — D509 Iron deficiency anemia, unspecified: Secondary | ICD-10-CM | POA: Diagnosis not present

## 2015-06-25 DIAGNOSIS — N186 End stage renal disease: Secondary | ICD-10-CM | POA: Diagnosis not present

## 2015-06-25 DIAGNOSIS — N2581 Secondary hyperparathyroidism of renal origin: Secondary | ICD-10-CM | POA: Diagnosis not present

## 2015-06-27 DIAGNOSIS — N2581 Secondary hyperparathyroidism of renal origin: Secondary | ICD-10-CM | POA: Diagnosis not present

## 2015-06-27 DIAGNOSIS — D509 Iron deficiency anemia, unspecified: Secondary | ICD-10-CM | POA: Diagnosis not present

## 2015-06-27 DIAGNOSIS — D631 Anemia in chronic kidney disease: Secondary | ICD-10-CM | POA: Diagnosis not present

## 2015-06-27 DIAGNOSIS — N186 End stage renal disease: Secondary | ICD-10-CM | POA: Diagnosis not present

## 2015-06-30 DIAGNOSIS — D509 Iron deficiency anemia, unspecified: Secondary | ICD-10-CM | POA: Diagnosis not present

## 2015-06-30 DIAGNOSIS — D631 Anemia in chronic kidney disease: Secondary | ICD-10-CM | POA: Diagnosis not present

## 2015-06-30 DIAGNOSIS — N186 End stage renal disease: Secondary | ICD-10-CM | POA: Diagnosis not present

## 2015-06-30 DIAGNOSIS — N2581 Secondary hyperparathyroidism of renal origin: Secondary | ICD-10-CM | POA: Diagnosis not present

## 2015-07-02 DIAGNOSIS — N186 End stage renal disease: Secondary | ICD-10-CM | POA: Diagnosis not present

## 2015-07-02 DIAGNOSIS — N2581 Secondary hyperparathyroidism of renal origin: Secondary | ICD-10-CM | POA: Diagnosis not present

## 2015-07-02 DIAGNOSIS — D509 Iron deficiency anemia, unspecified: Secondary | ICD-10-CM | POA: Diagnosis not present

## 2015-07-02 DIAGNOSIS — D631 Anemia in chronic kidney disease: Secondary | ICD-10-CM | POA: Diagnosis not present

## 2015-07-04 DIAGNOSIS — D509 Iron deficiency anemia, unspecified: Secondary | ICD-10-CM | POA: Diagnosis not present

## 2015-07-04 DIAGNOSIS — D631 Anemia in chronic kidney disease: Secondary | ICD-10-CM | POA: Diagnosis not present

## 2015-07-04 DIAGNOSIS — N2581 Secondary hyperparathyroidism of renal origin: Secondary | ICD-10-CM | POA: Diagnosis not present

## 2015-07-04 DIAGNOSIS — N186 End stage renal disease: Secondary | ICD-10-CM | POA: Diagnosis not present

## 2015-07-08 DIAGNOSIS — N186 End stage renal disease: Secondary | ICD-10-CM | POA: Diagnosis not present

## 2015-07-08 DIAGNOSIS — D509 Iron deficiency anemia, unspecified: Secondary | ICD-10-CM | POA: Diagnosis not present

## 2015-07-08 DIAGNOSIS — N2581 Secondary hyperparathyroidism of renal origin: Secondary | ICD-10-CM | POA: Diagnosis not present

## 2015-07-08 DIAGNOSIS — D631 Anemia in chronic kidney disease: Secondary | ICD-10-CM | POA: Diagnosis not present

## 2015-07-09 DIAGNOSIS — N2581 Secondary hyperparathyroidism of renal origin: Secondary | ICD-10-CM | POA: Diagnosis not present

## 2015-07-09 DIAGNOSIS — D631 Anemia in chronic kidney disease: Secondary | ICD-10-CM | POA: Diagnosis not present

## 2015-07-09 DIAGNOSIS — N186 End stage renal disease: Secondary | ICD-10-CM | POA: Diagnosis not present

## 2015-07-09 DIAGNOSIS — D509 Iron deficiency anemia, unspecified: Secondary | ICD-10-CM | POA: Diagnosis not present

## 2015-07-11 DIAGNOSIS — D631 Anemia in chronic kidney disease: Secondary | ICD-10-CM | POA: Diagnosis not present

## 2015-07-11 DIAGNOSIS — N186 End stage renal disease: Secondary | ICD-10-CM | POA: Diagnosis not present

## 2015-07-11 DIAGNOSIS — N2581 Secondary hyperparathyroidism of renal origin: Secondary | ICD-10-CM | POA: Diagnosis not present

## 2015-07-11 DIAGNOSIS — D509 Iron deficiency anemia, unspecified: Secondary | ICD-10-CM | POA: Diagnosis not present

## 2015-07-12 DIAGNOSIS — I120 Hypertensive chronic kidney disease with stage 5 chronic kidney disease or end stage renal disease: Secondary | ICD-10-CM | POA: Diagnosis not present

## 2015-07-12 DIAGNOSIS — Z992 Dependence on renal dialysis: Secondary | ICD-10-CM | POA: Diagnosis not present

## 2015-07-12 DIAGNOSIS — N186 End stage renal disease: Secondary | ICD-10-CM | POA: Diagnosis not present

## 2015-07-14 DIAGNOSIS — N186 End stage renal disease: Secondary | ICD-10-CM | POA: Diagnosis not present

## 2015-07-14 DIAGNOSIS — D631 Anemia in chronic kidney disease: Secondary | ICD-10-CM | POA: Diagnosis not present

## 2015-07-14 DIAGNOSIS — N2581 Secondary hyperparathyroidism of renal origin: Secondary | ICD-10-CM | POA: Diagnosis not present

## 2015-07-14 DIAGNOSIS — D509 Iron deficiency anemia, unspecified: Secondary | ICD-10-CM | POA: Diagnosis not present

## 2015-07-16 DIAGNOSIS — N186 End stage renal disease: Secondary | ICD-10-CM | POA: Diagnosis not present

## 2015-07-16 DIAGNOSIS — D631 Anemia in chronic kidney disease: Secondary | ICD-10-CM | POA: Diagnosis not present

## 2015-07-16 DIAGNOSIS — D509 Iron deficiency anemia, unspecified: Secondary | ICD-10-CM | POA: Diagnosis not present

## 2015-07-16 DIAGNOSIS — N2581 Secondary hyperparathyroidism of renal origin: Secondary | ICD-10-CM | POA: Diagnosis not present

## 2015-07-18 DIAGNOSIS — D631 Anemia in chronic kidney disease: Secondary | ICD-10-CM | POA: Diagnosis not present

## 2015-07-18 DIAGNOSIS — D509 Iron deficiency anemia, unspecified: Secondary | ICD-10-CM | POA: Diagnosis not present

## 2015-07-18 DIAGNOSIS — N186 End stage renal disease: Secondary | ICD-10-CM | POA: Diagnosis not present

## 2015-07-18 DIAGNOSIS — N2581 Secondary hyperparathyroidism of renal origin: Secondary | ICD-10-CM | POA: Diagnosis not present

## 2015-07-21 DIAGNOSIS — D631 Anemia in chronic kidney disease: Secondary | ICD-10-CM | POA: Diagnosis not present

## 2015-07-21 DIAGNOSIS — N2581 Secondary hyperparathyroidism of renal origin: Secondary | ICD-10-CM | POA: Diagnosis not present

## 2015-07-21 DIAGNOSIS — D509 Iron deficiency anemia, unspecified: Secondary | ICD-10-CM | POA: Diagnosis not present

## 2015-07-21 DIAGNOSIS — N186 End stage renal disease: Secondary | ICD-10-CM | POA: Diagnosis not present

## 2015-07-23 DIAGNOSIS — M542 Cervicalgia: Secondary | ICD-10-CM | POA: Diagnosis not present

## 2015-07-24 DIAGNOSIS — D631 Anemia in chronic kidney disease: Secondary | ICD-10-CM | POA: Diagnosis not present

## 2015-07-24 DIAGNOSIS — N186 End stage renal disease: Secondary | ICD-10-CM | POA: Diagnosis not present

## 2015-07-24 DIAGNOSIS — N2581 Secondary hyperparathyroidism of renal origin: Secondary | ICD-10-CM | POA: Diagnosis not present

## 2015-07-24 DIAGNOSIS — D509 Iron deficiency anemia, unspecified: Secondary | ICD-10-CM | POA: Diagnosis not present

## 2015-07-28 DIAGNOSIS — D631 Anemia in chronic kidney disease: Secondary | ICD-10-CM | POA: Diagnosis not present

## 2015-07-28 DIAGNOSIS — N2581 Secondary hyperparathyroidism of renal origin: Secondary | ICD-10-CM | POA: Diagnosis not present

## 2015-07-28 DIAGNOSIS — D509 Iron deficiency anemia, unspecified: Secondary | ICD-10-CM | POA: Diagnosis not present

## 2015-07-28 DIAGNOSIS — N186 End stage renal disease: Secondary | ICD-10-CM | POA: Diagnosis not present

## 2015-07-30 DIAGNOSIS — D631 Anemia in chronic kidney disease: Secondary | ICD-10-CM | POA: Diagnosis not present

## 2015-07-30 DIAGNOSIS — N2581 Secondary hyperparathyroidism of renal origin: Secondary | ICD-10-CM | POA: Diagnosis not present

## 2015-07-30 DIAGNOSIS — D509 Iron deficiency anemia, unspecified: Secondary | ICD-10-CM | POA: Diagnosis not present

## 2015-07-30 DIAGNOSIS — N186 End stage renal disease: Secondary | ICD-10-CM | POA: Diagnosis not present

## 2015-08-01 DIAGNOSIS — D509 Iron deficiency anemia, unspecified: Secondary | ICD-10-CM | POA: Diagnosis not present

## 2015-08-01 DIAGNOSIS — N2581 Secondary hyperparathyroidism of renal origin: Secondary | ICD-10-CM | POA: Diagnosis not present

## 2015-08-01 DIAGNOSIS — N186 End stage renal disease: Secondary | ICD-10-CM | POA: Diagnosis not present

## 2015-08-01 DIAGNOSIS — D631 Anemia in chronic kidney disease: Secondary | ICD-10-CM | POA: Diagnosis not present

## 2015-08-04 DIAGNOSIS — D631 Anemia in chronic kidney disease: Secondary | ICD-10-CM | POA: Diagnosis not present

## 2015-08-04 DIAGNOSIS — N186 End stage renal disease: Secondary | ICD-10-CM | POA: Diagnosis not present

## 2015-08-04 DIAGNOSIS — D509 Iron deficiency anemia, unspecified: Secondary | ICD-10-CM | POA: Diagnosis not present

## 2015-08-04 DIAGNOSIS — N2581 Secondary hyperparathyroidism of renal origin: Secondary | ICD-10-CM | POA: Diagnosis not present

## 2015-08-05 DIAGNOSIS — I871 Compression of vein: Secondary | ICD-10-CM | POA: Diagnosis not present

## 2015-08-05 DIAGNOSIS — T82858D Stenosis of vascular prosthetic devices, implants and grafts, subsequent encounter: Secondary | ICD-10-CM | POA: Diagnosis not present

## 2015-08-05 DIAGNOSIS — N186 End stage renal disease: Secondary | ICD-10-CM | POA: Diagnosis not present

## 2015-08-05 DIAGNOSIS — Z992 Dependence on renal dialysis: Secondary | ICD-10-CM | POA: Diagnosis not present

## 2015-08-06 DIAGNOSIS — N2581 Secondary hyperparathyroidism of renal origin: Secondary | ICD-10-CM | POA: Diagnosis not present

## 2015-08-06 DIAGNOSIS — N186 End stage renal disease: Secondary | ICD-10-CM | POA: Diagnosis not present

## 2015-08-06 DIAGNOSIS — D509 Iron deficiency anemia, unspecified: Secondary | ICD-10-CM | POA: Diagnosis not present

## 2015-08-06 DIAGNOSIS — D631 Anemia in chronic kidney disease: Secondary | ICD-10-CM | POA: Diagnosis not present

## 2015-08-08 DIAGNOSIS — D509 Iron deficiency anemia, unspecified: Secondary | ICD-10-CM | POA: Diagnosis not present

## 2015-08-08 DIAGNOSIS — N186 End stage renal disease: Secondary | ICD-10-CM | POA: Diagnosis not present

## 2015-08-08 DIAGNOSIS — N2581 Secondary hyperparathyroidism of renal origin: Secondary | ICD-10-CM | POA: Diagnosis not present

## 2015-08-08 DIAGNOSIS — D631 Anemia in chronic kidney disease: Secondary | ICD-10-CM | POA: Diagnosis not present

## 2015-08-11 DIAGNOSIS — N186 End stage renal disease: Secondary | ICD-10-CM | POA: Diagnosis not present

## 2015-08-11 DIAGNOSIS — D631 Anemia in chronic kidney disease: Secondary | ICD-10-CM | POA: Diagnosis not present

## 2015-08-11 DIAGNOSIS — N2581 Secondary hyperparathyroidism of renal origin: Secondary | ICD-10-CM | POA: Diagnosis not present

## 2015-08-11 DIAGNOSIS — D509 Iron deficiency anemia, unspecified: Secondary | ICD-10-CM | POA: Diagnosis not present

## 2015-08-12 DIAGNOSIS — Z992 Dependence on renal dialysis: Secondary | ICD-10-CM | POA: Diagnosis not present

## 2015-08-12 DIAGNOSIS — I120 Hypertensive chronic kidney disease with stage 5 chronic kidney disease or end stage renal disease: Secondary | ICD-10-CM | POA: Diagnosis not present

## 2015-08-12 DIAGNOSIS — N186 End stage renal disease: Secondary | ICD-10-CM | POA: Diagnosis not present

## 2015-08-13 DIAGNOSIS — D631 Anemia in chronic kidney disease: Secondary | ICD-10-CM | POA: Diagnosis not present

## 2015-08-13 DIAGNOSIS — N2581 Secondary hyperparathyroidism of renal origin: Secondary | ICD-10-CM | POA: Diagnosis not present

## 2015-08-13 DIAGNOSIS — D509 Iron deficiency anemia, unspecified: Secondary | ICD-10-CM | POA: Diagnosis not present

## 2015-08-13 DIAGNOSIS — N186 End stage renal disease: Secondary | ICD-10-CM | POA: Diagnosis not present

## 2015-08-15 DIAGNOSIS — D631 Anemia in chronic kidney disease: Secondary | ICD-10-CM | POA: Diagnosis not present

## 2015-08-15 DIAGNOSIS — D509 Iron deficiency anemia, unspecified: Secondary | ICD-10-CM | POA: Diagnosis not present

## 2015-08-15 DIAGNOSIS — N186 End stage renal disease: Secondary | ICD-10-CM | POA: Diagnosis not present

## 2015-08-15 DIAGNOSIS — N2581 Secondary hyperparathyroidism of renal origin: Secondary | ICD-10-CM | POA: Diagnosis not present

## 2015-08-18 DIAGNOSIS — N2581 Secondary hyperparathyroidism of renal origin: Secondary | ICD-10-CM | POA: Diagnosis not present

## 2015-08-18 DIAGNOSIS — N186 End stage renal disease: Secondary | ICD-10-CM | POA: Diagnosis not present

## 2015-08-18 DIAGNOSIS — D631 Anemia in chronic kidney disease: Secondary | ICD-10-CM | POA: Diagnosis not present

## 2015-08-18 DIAGNOSIS — D509 Iron deficiency anemia, unspecified: Secondary | ICD-10-CM | POA: Diagnosis not present

## 2015-08-19 DIAGNOSIS — D631 Anemia in chronic kidney disease: Secondary | ICD-10-CM | POA: Diagnosis not present

## 2015-08-19 DIAGNOSIS — N186 End stage renal disease: Secondary | ICD-10-CM | POA: Diagnosis not present

## 2015-08-19 DIAGNOSIS — N2581 Secondary hyperparathyroidism of renal origin: Secondary | ICD-10-CM | POA: Diagnosis not present

## 2015-08-19 DIAGNOSIS — D509 Iron deficiency anemia, unspecified: Secondary | ICD-10-CM | POA: Diagnosis not present

## 2015-08-22 DIAGNOSIS — N2581 Secondary hyperparathyroidism of renal origin: Secondary | ICD-10-CM | POA: Diagnosis not present

## 2015-08-22 DIAGNOSIS — D631 Anemia in chronic kidney disease: Secondary | ICD-10-CM | POA: Diagnosis not present

## 2015-08-22 DIAGNOSIS — D509 Iron deficiency anemia, unspecified: Secondary | ICD-10-CM | POA: Diagnosis not present

## 2015-08-22 DIAGNOSIS — N186 End stage renal disease: Secondary | ICD-10-CM | POA: Diagnosis not present

## 2015-08-25 DIAGNOSIS — N2581 Secondary hyperparathyroidism of renal origin: Secondary | ICD-10-CM | POA: Diagnosis not present

## 2015-08-25 DIAGNOSIS — N186 End stage renal disease: Secondary | ICD-10-CM | POA: Diagnosis not present

## 2015-08-25 DIAGNOSIS — D631 Anemia in chronic kidney disease: Secondary | ICD-10-CM | POA: Diagnosis not present

## 2015-08-25 DIAGNOSIS — D509 Iron deficiency anemia, unspecified: Secondary | ICD-10-CM | POA: Diagnosis not present

## 2015-08-26 DIAGNOSIS — N186 End stage renal disease: Secondary | ICD-10-CM | POA: Diagnosis not present

## 2015-08-26 DIAGNOSIS — D509 Iron deficiency anemia, unspecified: Secondary | ICD-10-CM | POA: Diagnosis not present

## 2015-08-26 DIAGNOSIS — D631 Anemia in chronic kidney disease: Secondary | ICD-10-CM | POA: Diagnosis not present

## 2015-08-26 DIAGNOSIS — N2581 Secondary hyperparathyroidism of renal origin: Secondary | ICD-10-CM | POA: Diagnosis not present

## 2015-08-29 DIAGNOSIS — D509 Iron deficiency anemia, unspecified: Secondary | ICD-10-CM | POA: Diagnosis not present

## 2015-08-29 DIAGNOSIS — D631 Anemia in chronic kidney disease: Secondary | ICD-10-CM | POA: Diagnosis not present

## 2015-08-29 DIAGNOSIS — N2581 Secondary hyperparathyroidism of renal origin: Secondary | ICD-10-CM | POA: Diagnosis not present

## 2015-08-29 DIAGNOSIS — N186 End stage renal disease: Secondary | ICD-10-CM | POA: Diagnosis not present

## 2015-08-29 IMAGING — CR DG CHEST 1V PORT
1 series · 1 of 1 positions shown · non-contrast
Comparison: 10/22/2013.

CLINICAL DATA: Chest pain.

EXAM:
PORTABLE CHEST - 1 VIEW

[AP]
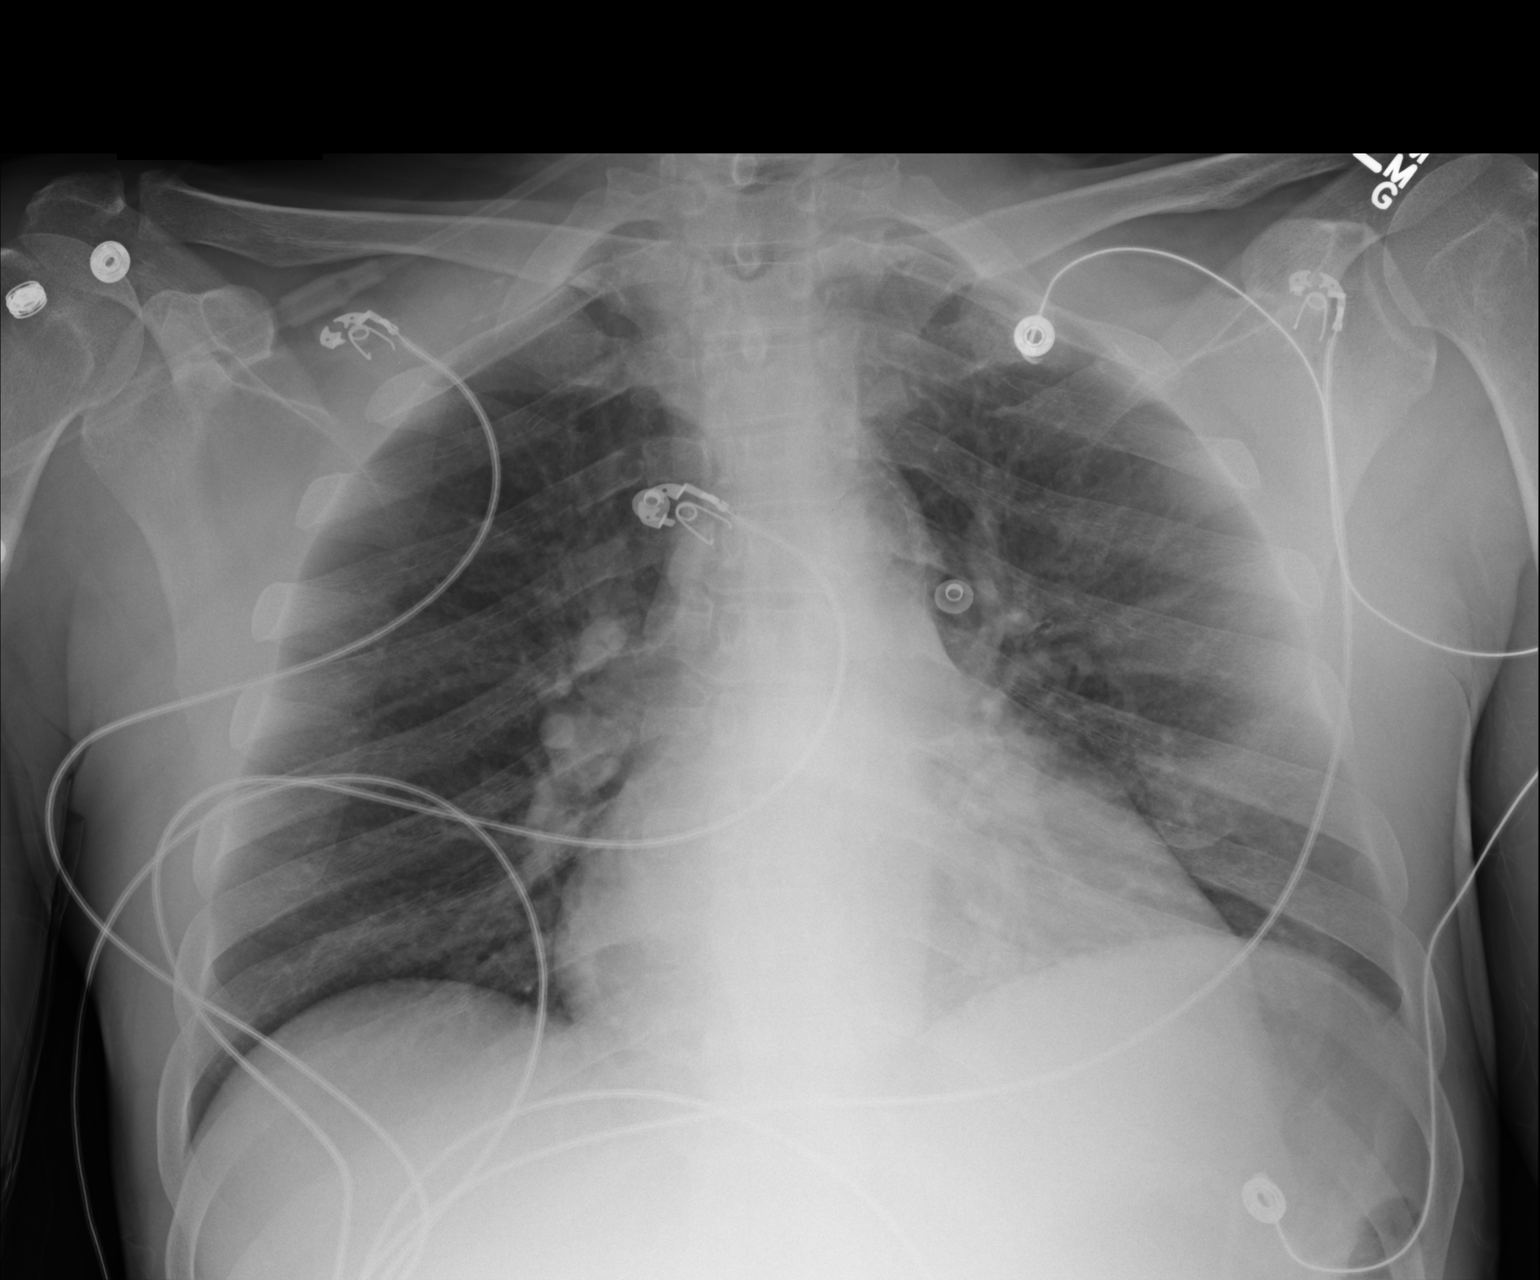

[1 of 1 positions shown; findings below may reference images not displayed]

FINDINGS: The heart is mildly enlarged but stable. There is mild tortuosity of
the thoracic aorta. The lungs are clear. Dense carotid artery
calcifications are noted bilaterally.
IMPRESSION: No acute pulmonary findings.

## 2015-09-01 DIAGNOSIS — D509 Iron deficiency anemia, unspecified: Secondary | ICD-10-CM | POA: Diagnosis not present

## 2015-09-01 DIAGNOSIS — D631 Anemia in chronic kidney disease: Secondary | ICD-10-CM | POA: Diagnosis not present

## 2015-09-01 DIAGNOSIS — N2581 Secondary hyperparathyroidism of renal origin: Secondary | ICD-10-CM | POA: Diagnosis not present

## 2015-09-01 DIAGNOSIS — N186 End stage renal disease: Secondary | ICD-10-CM | POA: Diagnosis not present

## 2015-09-03 ENCOUNTER — Emergency Department (HOSPITAL_COMMUNITY)
Admission: EM | Admit: 2015-09-03 | Discharge: 2015-09-03 | Disposition: A | Discharge status: Home / Self Care | Payer: Medicare Other | Attending: Emergency Medicine | Admitting: Emergency Medicine

## 2015-09-03 ENCOUNTER — Encounter (HOSPITAL_COMMUNITY): Payer: Self-pay | Admitting: Emergency Medicine

## 2015-09-03 ENCOUNTER — Emergency Department (HOSPITAL_COMMUNITY): Payer: Medicare Other

## 2015-09-03 DIAGNOSIS — Z87448 Personal history of other diseases of urinary system: Secondary | ICD-10-CM | POA: Insufficient documentation

## 2015-09-03 DIAGNOSIS — Z862 Personal history of diseases of the blood and blood-forming organs and certain disorders involving the immune mechanism: Secondary | ICD-10-CM | POA: Insufficient documentation

## 2015-09-03 DIAGNOSIS — N186 End stage renal disease: Secondary | ICD-10-CM | POA: Insufficient documentation

## 2015-09-03 DIAGNOSIS — Z79899 Other long term (current) drug therapy: Secondary | ICD-10-CM | POA: Diagnosis not present

## 2015-09-03 DIAGNOSIS — Z8619 Personal history of other infectious and parasitic diseases: Secondary | ICD-10-CM | POA: Insufficient documentation

## 2015-09-03 DIAGNOSIS — I120 Hypertensive chronic kidney disease with stage 5 chronic kidney disease or end stage renal disease: Secondary | ICD-10-CM | POA: Diagnosis not present

## 2015-09-03 DIAGNOSIS — M79602 Pain in left arm: Secondary | ICD-10-CM | POA: Diagnosis present

## 2015-09-03 DIAGNOSIS — Z992 Dependence on renal dialysis: Secondary | ICD-10-CM | POA: Insufficient documentation

## 2015-09-03 DIAGNOSIS — R52 Pain, unspecified: Secondary | ICD-10-CM | POA: Diagnosis not present

## 2015-09-03 DIAGNOSIS — I252 Old myocardial infarction: Secondary | ICD-10-CM | POA: Diagnosis not present

## 2015-09-03 DIAGNOSIS — Z86010 Personal history of colonic polyps: Secondary | ICD-10-CM | POA: Diagnosis not present

## 2015-09-03 DIAGNOSIS — M25522 Pain in left elbow: Secondary | ICD-10-CM | POA: Diagnosis not present

## 2015-09-03 DIAGNOSIS — F17210 Nicotine dependence, cigarettes, uncomplicated: Secondary | ICD-10-CM | POA: Insufficient documentation

## 2015-09-03 DIAGNOSIS — M79622 Pain in left upper arm: Secondary | ICD-10-CM | POA: Diagnosis not present

## 2015-09-03 MED ORDER — HYDROCODONE-ACETAMINOPHEN 5-325 MG PO TABS: 2.0000 | ORAL_TABLET | ORAL | Status: DC | PRN

## 2015-09-03 NOTE — ED Notes (Signed)
Pt with breakfast tray.

## 2015-09-03 NOTE — Discharge Instructions (Signed)
Tylenol or motrin for pain

## 2015-09-03 NOTE — ED Provider Notes (Signed)
CSN: [id number]     Arrival date & time 09/03/15  0135 History   First MD Initiated Contact with Patient 09/03/15 [phone]     Chief Complaint  Patient presents with  . Vascular Access Problem     (Consider location/radiation/quality/duration/timing/severity/associated sxs/prior Treatment) HPI  Patient is a 55 year old male, he has a known history of end-stage renal disease, he is on dialysis, he gets dialysis on Monday Wednesday and Friday. He states that today he is here because he is having left arm pain. This has been going on for months, he has told his doctors about this at dialysis and feels as though he is not being heard. He has been referred to several subspecialists including cardiology who he has not yet seen. He reports that he has intermittent pain that starts in the left antecubital fossa, sometimes radiates up the arm, sometimes radiates to the fingertips, has some numbness and tingling of the fingertips as well. He denies these having any symptoms at this time. He is not having any numbness weakness or pain right at the second. He does report that it often comes on during dialysis and sometimes lasting entire dialysis session. There is no other symptoms including chest pain cough shortness of breath fevers. He has not had any imaging of his arm, he recently had to have a vascular procedure because of a proximal fistula blockage and had some kind of dilation. He has been doing well from a vascular standpoint since that time. He was supposed to dialyze today, he has already arranged to have this performed at a later time today.  Past Medical History  Diagnosis Date  . Chronic kidney disease   . Hypertension   . Hepatitis B, chronic ([place])   . Hepatitis C   . Anemia   . Metabolic bone disease   . Renal disorder   . Chest pain     DATE UNKNOWN, C/O PERIODICALLY  . GERD (gastroesophageal reflux disease)     DATE UNKNOWN  . Headache(784.0)     OCCASIONALLY  . Dialysis patient ([place])      Monday-Wednesday-Friday  . Hyperkalemia   . Pulmonary edema   . Hemorrhoids   . Adenomatous colon polyp     tubular  . Myocardial infarction [facility])    Past Surgical History  Procedure Laterality Date  . Av fistula placement  2012    BELIEVED WAS PLACED IN [place]  . Hemorrhoid banding     Family History  Problem Relation Age of Onset  . Heart disease Mother   . Lung cancer Mother   . Heart disease Father   . Malignant hyperthermia Father   . Hypertension Other   . COPD Other   . Colon cancer Neg Hx   . Throat cancer Sister    Social History  Substance Use Topics  . Smoking status: Current Every Day Smoker -- 0.25 packs/day for 37 years    Types: Cigarettes  . Smokeless tobacco: Never Used  . Alcohol Use: No     Comment: 1 beer/2 weeks.     Review of Systems  All other systems reviewed and are negative.     Allergies  Aspirin; Clonidine derivatives; and Tramadol  Home Medications   Prior to Admission medications   Medication Sig Start Date End Date Taking? Authorizing Provider  amLODipine (NORVASC) 10 MG tablet Take 10 mg by mouth at bedtime.    Yes Historical Provider, MD  carvedilol (COREG) 25 MG tablet Take 25 mg by mouth  2 (two) times daily with a meal.   Yes Historical Provider, MD  DULERA 200-5 MCG/ACT AERO Inhale 2-3 puffs into the lungs 2 (two) times daily.  07/31/14  Yes Historical Provider, MD  hydrALAZINE (APRESOLINE) 25 MG tablet Take 1 tablet (25 mg total) by mouth every 8 (eight) hours. 02/28/14  Yes Adrian Prows, MD  lisinopril (PRINIVIL,ZESTRIL) 20 MG tablet Take 40 mg by mouth daily.  09/07/12  Yes Historical Provider, MD  minoxidil (LONITEN) 10 MG tablet Take 5 mg by mouth 2 (two) times daily.  07/17/13  Yes Historical Provider, MD  multivitamin (RENA-VIT) TABS tablet Take 1 tablet by mouth daily.   Yes Historical Provider, MD  RENVELA 800 MG tablet Take 1,600-3,200 mg by mouth 3 (three) times daily with meals. Take 4 tablets (3200 mg) with each meal  and 2 (1600 mg) tablets with snacks. 07/09/13  Yes Historical Provider, MD  albuterol (PROVENTIL HFA;VENTOLIN HFA) 108 (90 BASE) MCG/ACT inhaler Inhale 2 puffs into the lungs every 4 (four) hours as needed for wheezing or shortness of breath.    Historical Provider, MD  hydrocortisone (PROCTOCORT) 1 % CREA Apply 1 application topically 2 (two) times daily. Patient not taking: Reported on 09/03/2015 04/09/14   Gatha Mayer, MD  polyethylene glycol powder (GLYCOLAX/MIRALAX) powder Take 17 g by mouth daily. Patient not taking: Reported on 09/03/2015 03/05/14   Gatha Mayer, MD   BP 205/77 mmHg  Pulse 67  Temp(Src) 97.5 F (36.4 C) (Oral)  Resp 18  Ht 5\' 10"  (1.778 m)  Wt 154 lb 3.2 oz (69.945 kg)  BMI 22.13 kg/m2  SpO2 100% Physical Exam  Constitutional: He appears well-developed and well-nourished. No distress.  HENT:  Head: Normocephalic and atraumatic.  Mouth/Throat: Oropharynx is clear and moist. No oropharyngeal exudate.  Eyes: Conjunctivae and EOM are normal. Pupils are equal, round, and reactive to light. Right eye exhibits no discharge. Left eye exhibits no discharge. No scleral icterus.  Neck: Normal range of motion. Neck supple. No JVD present. No thyromegaly present.  Cardiovascular: Normal rate, regular rhythm, normal heart sounds and intact distal pulses.  Exam reveals no gallop and no friction rub.   No murmur heard. Good thrill in the fistula of the left upper extremity to the antecubital fossa, normal capillary refill of the fingers, normal pulses at the radial artery on the left  Pulmonary/Chest: Effort normal and breath sounds normal. No respiratory distress. He has no wheezes. He has no rales.  Musculoskeletal: Normal range of motion. He exhibits no edema or tenderness.  Supple left elbow, full range of motion, soft compartments  Lymphadenopathy:    He has no cervical adenopathy.  Neurological: He is alert. Coordination normal.  Normal grip, normal sensation, normal  strength of both flexion and extension of the hand and fingers, normal strength at the elbow on the left, normal sensation of the entire hand in all nerve distributions  Skin: Skin is warm and dry. No rash noted. No erythema.  Psychiatric: He has a normal mood and affect. His behavior is normal.  Nursing note and vitals reviewed.   ED Course  Procedures (including critical care time) Labs Review Labs Reviewed - No data to display  Imaging Review Dg Elbow Complete Left  09/03/2015  CLINICAL DATA:  56 year old male with left upper extremity pain. History of arteriovenous fistula. EXAM: LEFT ELBOW - COMPLETE 3+ VIEW COMPARISON:  None. FINDINGS: No evidence of acute fracture, malalignment or joint effusion. Normal bony mineralization. No lytic or  blastic osseous lesion. Arterial and venous atherosclerotic vascular calcifications are noted along the course of the brachial artery N the aneurysmal venous segment. There are at least 3 peripheral calcifications consistent with venous aneurysms. Soft tissues are otherwise unremarkable. IMPRESSION: 1. No evidence of acute fracture or malalignment. 2. 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 Electronically Signed   By: Jacqulynn Cadet M.D.   On: 09/03/2015 08:56   I have personally reviewed and evaluated these images and lab results as part of my medical decision-making.    MDM   Final diagnoses:  Upper extremity pain, anterior, left    The patient appears well. The patient has normal vital signs other than some mild hypertension, he needs to dialyze today, I do not think that his pain is from a specific vascular or orthopedic or neurologic disorder, the patient seems to be perseverating on the thought that his fistula is pushing on a nerve, I do not see any evidence of neurologic dysfunction at this time. I will obtain a plain film x-ray of his left upper extremity at the elbow, the patient is requesting  food, I anticipate that he will be discharged shortly after the imaging shy of having significant abnormalities.  Xray neg VS with htn - pt is going to dialysis No HA - ambulatory with stable gait Informed of results.   Noemi Chapel, MD 09/03/15 [phone]

## 2015-09-03 NOTE — ED Notes (Signed)
Patient here with complaint of left arm pain. Patient explains that the pain is accompanied paresthesia from graft site to fingertips. Site is currently the appliance used for dialysis. HD schedule MWF, patient has not missed any appointments.

## 2015-09-04 DIAGNOSIS — D631 Anemia in chronic kidney disease: Secondary | ICD-10-CM | POA: Diagnosis not present

## 2015-09-04 DIAGNOSIS — N2581 Secondary hyperparathyroidism of renal origin: Secondary | ICD-10-CM | POA: Diagnosis not present

## 2015-09-04 DIAGNOSIS — N186 End stage renal disease: Secondary | ICD-10-CM | POA: Diagnosis not present

## 2015-09-04 DIAGNOSIS — D509 Iron deficiency anemia, unspecified: Secondary | ICD-10-CM | POA: Diagnosis not present

## 2015-09-05 DIAGNOSIS — D631 Anemia in chronic kidney disease: Secondary | ICD-10-CM | POA: Diagnosis not present

## 2015-09-05 DIAGNOSIS — N186 End stage renal disease: Secondary | ICD-10-CM | POA: Diagnosis not present

## 2015-09-05 DIAGNOSIS — N2581 Secondary hyperparathyroidism of renal origin: Secondary | ICD-10-CM | POA: Diagnosis not present

## 2015-09-05 DIAGNOSIS — D509 Iron deficiency anemia, unspecified: Secondary | ICD-10-CM | POA: Diagnosis not present

## 2015-09-08 DIAGNOSIS — D509 Iron deficiency anemia, unspecified: Secondary | ICD-10-CM | POA: Diagnosis not present

## 2015-09-08 DIAGNOSIS — N2581 Secondary hyperparathyroidism of renal origin: Secondary | ICD-10-CM | POA: Diagnosis not present

## 2015-09-08 DIAGNOSIS — N186 End stage renal disease: Secondary | ICD-10-CM | POA: Diagnosis not present

## 2015-09-08 DIAGNOSIS — D631 Anemia in chronic kidney disease: Secondary | ICD-10-CM | POA: Diagnosis not present

## 2015-09-09 DIAGNOSIS — N186 End stage renal disease: Secondary | ICD-10-CM | POA: Diagnosis not present

## 2015-09-09 DIAGNOSIS — I120 Hypertensive chronic kidney disease with stage 5 chronic kidney disease or end stage renal disease: Secondary | ICD-10-CM | POA: Diagnosis not present

## 2015-09-09 DIAGNOSIS — Z992 Dependence on renal dialysis: Secondary | ICD-10-CM | POA: Diagnosis not present

## 2015-09-10 DIAGNOSIS — N2581 Secondary hyperparathyroidism of renal origin: Secondary | ICD-10-CM | POA: Diagnosis not present

## 2015-09-10 DIAGNOSIS — D631 Anemia in chronic kidney disease: Secondary | ICD-10-CM | POA: Diagnosis not present

## 2015-09-10 DIAGNOSIS — D509 Iron deficiency anemia, unspecified: Secondary | ICD-10-CM | POA: Diagnosis not present

## 2015-09-10 DIAGNOSIS — N186 End stage renal disease: Secondary | ICD-10-CM | POA: Diagnosis not present

## 2015-09-11 DIAGNOSIS — N186 End stage renal disease: Secondary | ICD-10-CM | POA: Diagnosis not present

## 2015-09-11 DIAGNOSIS — T82858D Stenosis of vascular prosthetic devices, implants and grafts, subsequent encounter: Secondary | ICD-10-CM | POA: Diagnosis not present

## 2015-09-11 DIAGNOSIS — I871 Compression of vein: Secondary | ICD-10-CM | POA: Diagnosis not present

## 2015-09-11 DIAGNOSIS — Z992 Dependence on renal dialysis: Secondary | ICD-10-CM | POA: Diagnosis not present

## 2015-09-12 DIAGNOSIS — D631 Anemia in chronic kidney disease: Secondary | ICD-10-CM | POA: Diagnosis not present

## 2015-09-12 DIAGNOSIS — N186 End stage renal disease: Secondary | ICD-10-CM | POA: Diagnosis not present

## 2015-09-12 DIAGNOSIS — D509 Iron deficiency anemia, unspecified: Secondary | ICD-10-CM | POA: Diagnosis not present

## 2015-09-12 DIAGNOSIS — N2581 Secondary hyperparathyroidism of renal origin: Secondary | ICD-10-CM | POA: Diagnosis not present

## 2015-09-15 DIAGNOSIS — D631 Anemia in chronic kidney disease: Secondary | ICD-10-CM | POA: Diagnosis not present

## 2015-09-15 DIAGNOSIS — N2581 Secondary hyperparathyroidism of renal origin: Secondary | ICD-10-CM | POA: Diagnosis not present

## 2015-09-15 DIAGNOSIS — N186 End stage renal disease: Secondary | ICD-10-CM | POA: Diagnosis not present

## 2015-09-15 DIAGNOSIS — D509 Iron deficiency anemia, unspecified: Secondary | ICD-10-CM | POA: Diagnosis not present

## 2015-09-17 DIAGNOSIS — N186 End stage renal disease: Secondary | ICD-10-CM | POA: Diagnosis not present

## 2015-09-17 DIAGNOSIS — D631 Anemia in chronic kidney disease: Secondary | ICD-10-CM | POA: Diagnosis not present

## 2015-09-17 DIAGNOSIS — N2581 Secondary hyperparathyroidism of renal origin: Secondary | ICD-10-CM | POA: Diagnosis not present

## 2015-09-17 DIAGNOSIS — D509 Iron deficiency anemia, unspecified: Secondary | ICD-10-CM | POA: Diagnosis not present

## 2015-09-19 DIAGNOSIS — N186 End stage renal disease: Secondary | ICD-10-CM | POA: Diagnosis not present

## 2015-09-19 DIAGNOSIS — D509 Iron deficiency anemia, unspecified: Secondary | ICD-10-CM | POA: Diagnosis not present

## 2015-09-19 DIAGNOSIS — D631 Anemia in chronic kidney disease: Secondary | ICD-10-CM | POA: Diagnosis not present

## 2015-09-19 DIAGNOSIS — N2581 Secondary hyperparathyroidism of renal origin: Secondary | ICD-10-CM | POA: Diagnosis not present

## 2015-09-22 DIAGNOSIS — N2581 Secondary hyperparathyroidism of renal origin: Secondary | ICD-10-CM | POA: Diagnosis not present

## 2015-09-22 DIAGNOSIS — N186 End stage renal disease: Secondary | ICD-10-CM | POA: Diagnosis not present

## 2015-09-22 DIAGNOSIS — D509 Iron deficiency anemia, unspecified: Secondary | ICD-10-CM | POA: Diagnosis not present

## 2015-09-22 DIAGNOSIS — D631 Anemia in chronic kidney disease: Secondary | ICD-10-CM | POA: Diagnosis not present

## 2015-09-24 DIAGNOSIS — N2581 Secondary hyperparathyroidism of renal origin: Secondary | ICD-10-CM | POA: Diagnosis not present

## 2015-09-24 DIAGNOSIS — N186 End stage renal disease: Secondary | ICD-10-CM | POA: Diagnosis not present

## 2015-09-24 DIAGNOSIS — D509 Iron deficiency anemia, unspecified: Secondary | ICD-10-CM | POA: Diagnosis not present

## 2015-09-24 DIAGNOSIS — D631 Anemia in chronic kidney disease: Secondary | ICD-10-CM | POA: Diagnosis not present

## 2015-09-25 ENCOUNTER — Ambulatory Visit (INDEPENDENT_AMBULATORY_CARE_PROVIDER_SITE_OTHER): Payer: Medicare Other | Admitting: Neurology

## 2015-09-25 ENCOUNTER — Encounter: Payer: Self-pay | Admitting: Neurology

## 2015-09-25 VITALS — BP 172/72 | HR 73 | Ht 69.0 in | Wt 154.2 lb

## 2015-09-25 DIAGNOSIS — M79602 Pain in left arm: Secondary | ICD-10-CM | POA: Diagnosis not present

## 2015-09-25 MED ORDER — HYDROCODONE-ACETAMINOPHEN 5-325 MG PO TABS: 1.0000 | ORAL_TABLET | Freq: Four times a day (QID) | ORAL | Status: DC | PRN

## 2015-09-25 NOTE — Patient Instructions (Signed)
Remember to drink plenty of fluid, eat healthy meals and do not skip any meals. Try to eat protein with a every meal and eat a healthy snack such as fruit or nuts in between meals. Try to keep a regular sleep-wake schedule and try to exercise daily, particularly in the form of walking, 20-30 minutes a day, if you can.   As far as your medications are concerned, I would like to suggest: Vicodin  As far as diagnostic testing: emg/ncs  I would like to see you back for emg/ncs, sooner if we need to. Please call us with any interim questions, concerns, problems, updates or refill requests.   Our phone number is [phone]. We also have an after hours call service for urgent matters and there is a physician on-call for urgent questions. For any emergencies you know to call 911 or go to the nearest emergency room

## 2015-09-25 NOTE — Progress Notes (Signed)
[facility]    Provider:  Dr Jaynee Eagles Referring Provider: Nolene Ebbs, MD Primary Care Physician:  Philis Fendt, MD  CC:  Left arm pain  HPI:  [name] is a 55 y.o. male here as a referral from Dr. Jeanie Cooks for left arm pain. PMHx of CKD, HTN, Hepatitis C and B, Anemia, Dialysis. Pain the left arm started 3-4 months ag without trauma or inciting event. Feels like it starts underneath his fistula. The fistula has been in his left arm for 5 years. He recently lost 5-10 pounds nothing significant. No inciting factor. Just started hurting one day. Starts under the fistula and runs into all the fingers and when it hurts he gets short of breath it is so severe. He describes it as tingling, numbness, and cold. Right arm is fine. No weakness just significant pain. Radiation is continuous all day long and can be severe. Just tingling in the fingers but the whole hand gets cold. Radiates down the ventral forearm to the fingers. Also hurts into the shoulder. No neck pain. Went to the vein and vascular spcialists and they did a procedure which helped for a few days, they put a balloon and opened it up. Helped just for a few days but then worsened. Procedure was a month ago. Nothing makes it better or worse, just always painful.    Reviewed notes, labs and imaging from outside physicians, which showed:  XR left elbow 09/03/2015: 1. No evidence of acute fracture or malalignment. 2. 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  XR left shoulder joint 11/2014: No radiographic abnormality of left shoulder joint. Probable atherosclerotic calcification noted in left axillary artery.  CMP creatinine 7.06  Patient was seen in the ED on 2/22 with left arm pain. Paresthesias from great site to fingertips. This has been going on for months, he has told his doctors about this at dialysis and feels as though he is not being heard. He has been  referred to several subspecialists including cardiology who he has not yet seen. He reports that he has intermittent pain that starts in the left antecubital fossa, sometimes radiates up the arm, sometimes radiates to the fingertips, has some numbness and tingling of the fingertips as well. He does report that it often comes on during dialysis and sometimes lasting entire dialysis session. There is no other symptoms including chest pain cough shortness of breath fevers. He has not had any imaging of his arm, he recently had to have a vascular procedure because of a proximal fistula blockage and had some kind of dilation.  Review of Systems: Patient complains of symptoms per HPI as well as the following symptoms: short or breath, feeling cold. Pertinent negatives per HPI. All others negative.   Social History   Social History  . Marital Status: Single    Spouse Name: N/A  . Number of Children: 3  . Years of Education: 10   Occupational History  . Unemployed    Social History Main Topics  . Smoking status: Current Every Day Smoker -- 0.25 packs/day for 37 years    Types: Cigarettes  . Smokeless tobacco: Never Used  . Alcohol Use: No     Comment: 1 beer/2 weeks.   . Drug Use: Yes    Special: Marijuana, Cocaine     Comment: once month smoke marijuana  . Sexual Activity: Not on file     Comment: "NOT LATELY" on cocaine   Other Topics Concern  .  Not on file   Social History Narrative   Lives alone   Caffeine use: Coffee-rare   Soda- daily       Family History  Problem Relation Age of Onset  . Heart disease Mother   . Lung cancer Mother   . Heart disease Father   . Malignant hyperthermia Father   . Hypertension Other   . COPD Other   . Colon cancer Neg Hx   . Throat cancer Sister     Past Medical History  Diagnosis Date  . Chronic kidney disease   . Hypertension   . Hepatitis B, chronic ([place])   . Hepatitis C   . Anemia   . Metabolic bone disease   . Renal disorder    . Chest pain     DATE UNKNOWN, C/O PERIODICALLY  . GERD (gastroesophageal reflux disease)     DATE UNKNOWN  . Headache(784.0)     OCCASIONALLY  . Dialysis patient ([place])     Monday-Wednesday-Friday  . Hyperkalemia   . Pulmonary edema   . Hemorrhoids   . Adenomatous colon polyp     tubular  . Myocardial infarction [facility])     Past Surgical History  Procedure Laterality Date  . Av fistula placement  2012    BELIEVED WAS PLACED IN [place]  . Hemorrhoid banding      Current Outpatient Prescriptions  Medication Sig Dispense Refill  . amLODipine (NORVASC) 10 MG tablet Take 10 mg by mouth at bedtime.     . carvedilol (COREG) 25 MG tablet Take 25 mg by mouth 2 (two) times daily with a meal.    . cinacalcet (SENSIPAR) 30 MG tablet Take 30 mg by mouth daily.    . DULERA 100-5 MCG/ACT AERO Inhale 2 puffs into the lungs 2 (two) times daily.     Marland Kitchen lisinopril (PRINIVIL,ZESTRIL) 20 MG tablet Take 40 mg by mouth daily.     . minoxidil (LONITEN) 10 MG tablet Take 5 mg by mouth 2 (two) times daily.     . polyethylene glycol powder (GLYCOLAX/MIRALAX) powder Take 17 g by mouth daily. 527 g 11  . RENVELA 800 MG tablet Take 1,600-3,200 mg by mouth 3 (three) times daily with meals. Take 4 tablets (3200 mg) with each meal and 2 (1600 mg) tablets with snacks.    . hydrALAZINE (APRESOLINE) 25 MG tablet Take 1 tablet (25 mg total) by mouth every 8 (eight) hours. (Patient not taking: Reported on 09/25/2015) 90 tablet 1   No current facility-administered medications for this visit.    Allergies as of 09/25/2015 - Review Complete 09/25/2015  Allergen Reaction Noted  . Aspirin  02/27/2014  . Clonidine derivatives Other (See Comments) 08/05/2013  . Tramadol Itching 02/25/2011    Vitals: BP 172/72 mmHg  Pulse 73  Ht 5\' 9"  (1.753 m)  Wt 154 lb 3.2 oz (69.945 kg)  BMI 22.76 kg/m2 Last Weight:  Wt Readings from Last 1 Encounters:  09/25/15 154 lb 3.2 oz (69.945 kg)   Last Height:   Ht Readings from  Last 1 Encounters:  09/25/15 5\' 9"  (1.753 m)    Physical exam: Exam: Gen: NAD, thin                    CV: RRR, no MRG. No Carotid Bruits. No peripheral edema, warm, nontender Eyes: Conjunctivae clear without exudates or hemorrhage  Neuro: Detailed Neurologic Exam  Speech:    Speech is normal; fluent and spontaneous with normal comprehension.  Cognition:    The patient is oriented to person, place, and time;     recent and remote memory intact;     language fluent;     normal attention, concentration,     fund of knowledge Cranial Nerves:    The pupils are equal, round, and reactive to light. Attempted fundoscopic exam could not visualize. Visual fields are full to finger confrontation. Extraocular movements are intact. Trigeminal sensation is intact and the muscles of mastication are normal. The face is symmetric. The palate elevates in the midline. Hearing intact. Voice is normal. Shoulder shrug is normal. The tongue has normal motion without fasciculations.   Coordination:    Normal finger to nose and heel to shin. Normal rapid alternating movements.   Gait:    Heel-toe and tandem gait are normal.   Motor Observation:    No asymmetry, no atrophy, and no involuntary movements noted. Tone:    Normal muscle tone.    Posture:    Posture is normal. normal erect    Strength:    Strength is V/V in the upper and lower limbs including intrinsic hand muscles.     Sensation: intact to LT     Reflex Exam:  DTR's: Absent lowers, intact uppers could not test biceps on left due to fistula    Toes:    The toes are downgoing bilaterally.   Clonus:    Clonus is absent.      Assessment/Plan:  55 y.o. male here as a referral from Dr. Jeanie Cooks for left arm pain. PMHx of CKD, HTN, Hepatitis C and B, Anemia, Dialysis. Pain the left arm started 3-4 months ago without trauma or inciting event. Patient reports pain that starts under his fistula, tingling, numbness and cold that radiates  to the left hand and all the fingers. Exam is non focal. Could be secondary to direct compression by the graft or Steal Syndrome.   Emg/ncs of the upper extremities Patient has allergy to tramadol but has taken vicodin without side effects in the past. Will provide one prescription until emg/ncs.   Sarina Ill, MD  [facility] [address] [place], [place] [address]  Phone [phone] Fax [phone]

## 2015-09-26 DIAGNOSIS — D509 Iron deficiency anemia, unspecified: Secondary | ICD-10-CM | POA: Diagnosis not present

## 2015-09-26 DIAGNOSIS — D631 Anemia in chronic kidney disease: Secondary | ICD-10-CM | POA: Diagnosis not present

## 2015-09-26 DIAGNOSIS — N186 End stage renal disease: Secondary | ICD-10-CM | POA: Diagnosis not present

## 2015-09-26 DIAGNOSIS — N2581 Secondary hyperparathyroidism of renal origin: Secondary | ICD-10-CM | POA: Diagnosis not present

## 2015-09-29 DIAGNOSIS — N186 End stage renal disease: Secondary | ICD-10-CM | POA: Diagnosis not present

## 2015-09-29 DIAGNOSIS — D631 Anemia in chronic kidney disease: Secondary | ICD-10-CM | POA: Diagnosis not present

## 2015-09-29 DIAGNOSIS — N2581 Secondary hyperparathyroidism of renal origin: Secondary | ICD-10-CM | POA: Diagnosis not present

## 2015-09-29 DIAGNOSIS — D509 Iron deficiency anemia, unspecified: Secondary | ICD-10-CM | POA: Diagnosis not present

## 2015-10-01 DIAGNOSIS — D509 Iron deficiency anemia, unspecified: Secondary | ICD-10-CM | POA: Diagnosis not present

## 2015-10-01 DIAGNOSIS — N186 End stage renal disease: Secondary | ICD-10-CM | POA: Diagnosis not present

## 2015-10-01 DIAGNOSIS — N2581 Secondary hyperparathyroidism of renal origin: Secondary | ICD-10-CM | POA: Diagnosis not present

## 2015-10-01 DIAGNOSIS — D631 Anemia in chronic kidney disease: Secondary | ICD-10-CM | POA: Diagnosis not present

## 2015-10-02 ENCOUNTER — Ambulatory Visit (INDEPENDENT_AMBULATORY_CARE_PROVIDER_SITE_OTHER): Payer: Self-pay | Admitting: Neurology

## 2015-10-02 ENCOUNTER — Ambulatory Visit (INDEPENDENT_AMBULATORY_CARE_PROVIDER_SITE_OTHER): Payer: Medicare Other | Admitting: Neurology

## 2015-10-02 DIAGNOSIS — R202 Paresthesia of skin: Secondary | ICD-10-CM | POA: Diagnosis not present

## 2015-10-02 DIAGNOSIS — M79602 Pain in left arm: Secondary | ICD-10-CM

## 2015-10-02 DIAGNOSIS — Z0289 Encounter for other administrative examinations: Secondary | ICD-10-CM

## 2015-10-02 NOTE — Progress Notes (Signed)
See procedure note.

## 2015-10-02 NOTE — Progress Notes (Signed)
  [facility]    Provider:  Dr Jaynee Eagles Referring Provider: Pearson Grippe MD Primary Care Physician:  Philis Fendt, MD  CC:  Left arm pain  History:  55 y.o. male here as a referral from Dr. Jeanie Cooks for left arm pain. PMHx of CKD, HTN, Hepatitis C and B, Anemia, Dialysis. Pain the left arm started 3-4 months ago without trauma or inciting event. Patient reports pain that starts under his fistula, tingling, numbness and cold that radiates to the left hand and all the fingers. Exam is non focal. Could be secondary to direct compression by the graft or Steal Syndrome. Strength is V/V in the upper and lower limbs including intrinsic hand muscles. Sensation intact to LT and pin prick, no sensory deficits on exam.  Summary  Nerve conduction studies were performed on the bilateral upper extremities:  The bilateral Median motor nerves showed normal conductions with normal F Wave latencies The bilateral Ulnar motor nerves showed normal conductions with normal F Wave latencies The bilateral second-digit Median sensory nerves were within normal limits The bilateral fifth-digit Ulnar sensory nerves were within normal limits The bilateral Radial sensory nerves were within normal limits The bilateral lateral antebrachial cutaneous sensory nerves were within normal limits The bilateral medial antebrachial cutaneous sensory nerves were within normal limits  EMG needle study was attempted on the left upper extremity. The left flexor carpi radialis was within normal limits. EMG prematurely terminated at patient's request due to pain.  Conclusion: Nerve conduction studies showed no abnormalities in the motor or sensory nerves of the left upper extremity. EMG was terminated prematurely due to pain at patient's request. Recommend MR of the left forearm,ordered.  Sarina Ill, MD  [facility] [address] [place], [place] [address]  Phone [phone]  Fax [phone]

## 2015-10-03 DIAGNOSIS — N186 End stage renal disease: Secondary | ICD-10-CM | POA: Diagnosis not present

## 2015-10-03 DIAGNOSIS — N2581 Secondary hyperparathyroidism of renal origin: Secondary | ICD-10-CM | POA: Diagnosis not present

## 2015-10-03 DIAGNOSIS — D631 Anemia in chronic kidney disease: Secondary | ICD-10-CM | POA: Diagnosis not present

## 2015-10-03 DIAGNOSIS — D509 Iron deficiency anemia, unspecified: Secondary | ICD-10-CM | POA: Diagnosis not present

## 2015-10-06 DIAGNOSIS — D509 Iron deficiency anemia, unspecified: Secondary | ICD-10-CM | POA: Diagnosis not present

## 2015-10-06 DIAGNOSIS — N2581 Secondary hyperparathyroidism of renal origin: Secondary | ICD-10-CM | POA: Diagnosis not present

## 2015-10-06 DIAGNOSIS — N186 End stage renal disease: Secondary | ICD-10-CM | POA: Diagnosis not present

## 2015-10-06 DIAGNOSIS — D631 Anemia in chronic kidney disease: Secondary | ICD-10-CM | POA: Diagnosis not present

## 2015-10-08 ENCOUNTER — Emergency Department (HOSPITAL_COMMUNITY)
Admission: EM | Admit: 2015-10-08 | Discharge: 2015-10-08 | Disposition: A | Discharge status: Home / Self Care | Payer: Medicare Other | Attending: Emergency Medicine | Admitting: Emergency Medicine

## 2015-10-08 ENCOUNTER — Encounter (HOSPITAL_COMMUNITY): Payer: Self-pay | Admitting: Emergency Medicine

## 2015-10-08 ENCOUNTER — Emergency Department (HOSPITAL_COMMUNITY): Payer: Medicare Other

## 2015-10-08 DIAGNOSIS — Z862 Personal history of diseases of the blood and blood-forming organs and certain disorders involving the immune mechanism: Secondary | ICD-10-CM | POA: Diagnosis not present

## 2015-10-08 DIAGNOSIS — R1084 Generalized abdominal pain: Secondary | ICD-10-CM | POA: Diagnosis not present

## 2015-10-08 DIAGNOSIS — Z992 Dependence on renal dialysis: Secondary | ICD-10-CM | POA: Diagnosis not present

## 2015-10-08 DIAGNOSIS — Z8619 Personal history of other infectious and parasitic diseases: Secondary | ICD-10-CM | POA: Insufficient documentation

## 2015-10-08 DIAGNOSIS — N186 End stage renal disease: Secondary | ICD-10-CM | POA: Insufficient documentation

## 2015-10-08 DIAGNOSIS — K219 Gastro-esophageal reflux disease without esophagitis: Secondary | ICD-10-CM | POA: Diagnosis not present

## 2015-10-08 DIAGNOSIS — I252 Old myocardial infarction: Secondary | ICD-10-CM | POA: Insufficient documentation

## 2015-10-08 DIAGNOSIS — R109 Unspecified abdominal pain: Secondary | ICD-10-CM | POA: Diagnosis not present

## 2015-10-08 DIAGNOSIS — F17210 Nicotine dependence, cigarettes, uncomplicated: Secondary | ICD-10-CM | POA: Insufficient documentation

## 2015-10-08 DIAGNOSIS — Z86010 Personal history of colonic polyps: Secondary | ICD-10-CM | POA: Insufficient documentation

## 2015-10-08 DIAGNOSIS — I120 Hypertensive chronic kidney disease with stage 5 chronic kidney disease or end stage renal disease: Secondary | ICD-10-CM | POA: Insufficient documentation

## 2015-10-08 DIAGNOSIS — Z79899 Other long term (current) drug therapy: Secondary | ICD-10-CM | POA: Insufficient documentation

## 2015-10-08 LAB — COMPREHENSIVE METABOLIC PANEL
ALT: 31 U/L (ref 17–63)
AST: 36 U/L (ref 15–41)
Albumin: 3.9 g/dL (ref 3.5–5.0)
Alkaline Phosphatase: 74 U/L (ref 38–126)
Anion gap: 15 (ref 5–15)
BUN: 40 mg/dL — ABNORMAL HIGH (ref 6–20)
CO2: 25 mmol/L (ref 22–32)
Calcium: 9.3 mg/dL (ref 8.9–10.3)
Chloride: 96 mmol/L — ABNORMAL LOW (ref 101–111)
Creatinine, Ser: 9.29 mg/dL — ABNORMAL HIGH (ref 0.61–1.24)
GFR calc Af Amer: 7 mL/min — ABNORMAL LOW (ref >60)
GFR calc non Af Amer: 6 mL/min — ABNORMAL LOW (ref >60)
Glucose, Bld: 116 mg/dL — ABNORMAL HIGH (ref 65–99)
Potassium: 3.9 mmol/L (ref 3.5–5.1)
Sodium: 136 mmol/L (ref 135–145)
Total Bilirubin: 1.3 mg/dL — ABNORMAL HIGH (ref 0.3–1.2)
Total Protein: 8.7 g/dL — ABNORMAL HIGH (ref 6.5–8.1)

## 2015-10-08 LAB — CBC
HCT: 29.3 % — ABNORMAL LOW (ref 39.0–52.0)
Hemoglobin: 9.9 g/dL — ABNORMAL LOW (ref 13.0–17.0)
MCH: 27 pg (ref 26.0–34.0)
MCHC: 33.8 g/dL (ref 30.0–36.0)
MCV: 79.8 fL (ref 78.0–100.0)
Platelets: 104 10*3/uL — ABNORMAL LOW (ref 150–400)
RBC: 3.67 MIL/uL — ABNORMAL LOW (ref 4.22–5.81)
RDW: 20.7 % — ABNORMAL HIGH (ref 11.5–15.5)
WBC: 8.1 10*3/uL (ref 4.0–10.5)

## 2015-10-08 LAB — LIPASE, BLOOD: Lipase: 48 U/L (ref 11–51)

## 2015-10-08 MED ORDER — IOPAMIDOL (ISOVUE-300) INJECTION 61%
INTRAVENOUS | Status: AC
  Administered 2015-10-08: 100 mL
  Filled 2015-10-08: qty 100

## 2015-10-08 MED ORDER — ACETAMINOPHEN 325 MG PO TABS
650.0000 mg | ORAL_TABLET | Freq: Once | ORAL | Status: AC
  Administered 2015-10-08: 650 mg via ORAL
  Filled 2015-10-08: qty 2

## 2015-10-08 MED ORDER — HYDROCODONE-ACETAMINOPHEN 5-325 MG PO TABS: 1.0000 | ORAL_TABLET | Freq: Four times a day (QID) | ORAL | Status: DC | PRN

## 2015-10-08 MED ORDER — HYDROMORPHONE HCL 1 MG/ML IJ SOLN
0.5000 mg | Freq: Once | INTRAMUSCULAR | Status: AC
  Administered 2015-10-08: 0.5 mg via INTRAVENOUS
  Filled 2015-10-08: qty 1

## 2015-10-08 MED ORDER — FENTANYL CITRATE (PF) 100 MCG/2ML IJ SOLN
50.0000 ug | INTRAMUSCULAR | Status: DC | PRN
  Administered 2015-10-08: 50 ug via NASAL

## 2015-10-08 MED ORDER — FENTANYL CITRATE (PF) 100 MCG/2ML IJ SOLN
INTRAMUSCULAR | Status: AC
  Filled 2015-10-08: qty 2

## 2015-10-08 NOTE — Discharge Instructions (Signed)
Please read and follow all provided instructions.  Your diagnoses today include:  1. Generalized abdominal pain    Tests performed today include:  Blood counts and electrolytes  Blood tests to check liver and kidney function  Blood tests to check pancreas function  Urine test to look for infection and pregnancy (in women)  Vital signs. See below for your results today.   Medications prescribed:   Take any prescribed medications only as directed.  Home care instructions:   Follow any educational materials contained in this packet.  Follow-up instructions: Please follow-up with your primary care provider in the next 2 days for further evaluation of your symptoms. Call and make appointment with Dialysis center today when you leave    Return instructions:  SEEK IMMEDIATE MEDICAL ATTENTION IF:  The pain does not go away or becomes severe   A temperature above 101F develops   Repeated vomiting occurs (multiple episodes)   The pain becomes localized to portions of the abdomen. The right side could possibly be appendicitis. In an adult, the left lower portion of the abdomen could be colitis or diverticulitis.   Blood is being passed in stools or vomit (bright red or black tarry stools)   You develop chest pain, difficulty breathing, dizziness or fainting, or become confused, poorly responsive, or inconsolable (young children)  If you have any other emergent concerns regarding your health  Additional Information: Abdominal (belly) pain can be caused by many things. Your caregiver performed an examination and possibly ordered blood/urine tests and imaging (CT scan, x-rays, ultrasound). Many cases can be observed and treated at home after initial evaluation in the emergency department. Even though you are being discharged home, abdominal pain can be unpredictable. Therefore, you need a repeated exam if your pain does not resolve, returns, or worsens. Most patients with abdominal  pain don't have to be admitted to the hospital or have surgery, but serious problems like appendicitis and gallbladder attacks can start out as nonspecific pain. Many abdominal conditions cannot be diagnosed in one visit, so follow-up evaluations are very important.  Your vital signs today were: BP 140/74 mmHg   Pulse 87   Temp(Src) 98.9 F (37.2 C) (Oral)   Resp 18   SpO2 94% If your blood pressure (bp) was elevated above 135/85 this visit, please have this repeated by your doctor within one month. --------------

## 2015-10-08 NOTE — ED Notes (Addendum)
Patient alert and oriented at discharge.  Vital signs stable.  Patients temp 100.2, PA made aware and patient given Tylenol at discharge.  No new medication ordered for patients pain of 10/10 in abdomen.  Patient taken out to waiting room via wheelchair.

## 2015-10-08 NOTE — ED Notes (Signed)
Patient arrives with complaint of abdominal pain. States onset tonight after drinking water. Patient rocking in chair in triage complaining of pain. Initially refused blood draw and demanded pain medication prior to any other intervention, but then agreed after discussion. ESRD present. MWF schedule; denies missing appointments.

## 2015-10-08 NOTE — ED Provider Notes (Signed)
CSN: [id number]     Arrival date & time 10/08/15  0019 History   First MD Initiated Contact with Patient 10/08/15 [phone]     Chief Complaint  Patient presents with  . Abdominal Pain   (Consider location/radiation/quality/duration/timing/severity/associated sxs/prior Treatment) HPI 55 y.o. male with a hx of ESRD, Hepatitis C, presents to the Emergency Department today complaining of generalized abdominal pain since 10p last night. Notes pain is 10/10 and all over his abdomen. Pain with deep inspiration. No N/V/D. No fevers. No CP/SOB. Pt makes little urine due to ESRD. No dysuria. He is a dialysis patient who is seen on MWF at [facility]. Has not tried any OTC remedies. No other symptoms noted. On Dialysis x 5 years.      Past Medical History  Diagnosis Date  . Chronic kidney disease   . Hypertension   . Hepatitis B, chronic ([place])   . Hepatitis C   . Anemia   . Metabolic bone disease   . Renal disorder   . Chest pain     DATE UNKNOWN, C/O PERIODICALLY  . GERD (gastroesophageal reflux disease)     DATE UNKNOWN  . Headache(784.0)     OCCASIONALLY  . Dialysis patient ([place])     Monday-Wednesday-Friday  . Hyperkalemia   . Pulmonary edema   . Hemorrhoids   . Adenomatous colon polyp     tubular  . Myocardial infarction [facility])    Past Surgical History  Procedure Laterality Date  . Av fistula placement  2012    BELIEVED WAS PLACED IN [place]  . Hemorrhoid banding     Family History  Problem Relation Age of Onset  . Heart disease Mother   . Lung cancer Mother   . Heart disease Father   . Malignant hyperthermia Father   . Hypertension Other   . COPD Other   . Colon cancer Neg Hx   . Throat cancer Sister    Social History  Substance Use Topics  . Smoking status: Current Every Day Smoker -- 0.25 packs/day for 37 years    Types: Cigarettes  . Smokeless tobacco: Never Used  . Alcohol Use: No     Comment: 1 beer/2 weeks.     Review of Systems ROS reviewed and all are  negative for acute change except as noted in the HPI.  Allergies  Aspirin; Clonidine derivatives; and Tramadol  Home Medications   Prior to Admission medications   Medication Sig Start Date End Date Taking? Authorizing Provider  amLODipine (NORVASC) 10 MG tablet Take 10 mg by mouth at bedtime.    Yes Historical Provider, MD  carvedilol (COREG) 25 MG tablet Take 25 mg by mouth 2 (two) times daily with a meal.   Yes Historical Provider, MD  cinacalcet (SENSIPAR) 30 MG tablet Take 30 mg by mouth daily.   Yes Historical Provider, MD  DULERA 100-5 MCG/ACT AERO Inhale 2 puffs into the lungs 2 (two) times daily.  09/22/15  Yes Historical Provider, MD  HYDROcodone-acetaminophen (NORCO/VICODIN) 5-325 MG tablet Take 1 tablet by mouth every 6 (six) hours as needed for moderate pain. 09/25/15  Yes Melvenia Beam, MD  lisinopril (PRINIVIL,ZESTRIL) 20 MG tablet Take 40 mg by mouth daily.  09/07/12  Yes Historical Provider, MD  minoxidil (LONITEN) 10 MG tablet Take 5 mg by mouth 2 (two) times daily.  07/17/13  Yes Historical Provider, MD  polyethylene glycol powder (GLYCOLAX/MIRALAX) powder Take 17 g by mouth daily. 03/05/14  Yes Gatha Mayer, MD  RENVELA 800 MG tablet Take 1,600-3,200 mg by mouth 3 (three) times daily with meals. Take 4 tablets (3200 mg) with each meal and 2 (1600 mg) tablets with snacks. 07/09/13  Yes Historical Provider, MD  hydrALAZINE (APRESOLINE) 25 MG tablet Take 1 tablet (25 mg total) by mouth every 8 (eight) hours. Patient not taking: Reported on 09/25/2015 02/28/14   Adrian Prows, MD   BP 129/67 mmHg  Pulse 89  Temp(Src) 100.1 F (37.8 C) (Oral)  Resp 20  SpO2 92%   Physical Exam  Constitutional: He is oriented to person, place, and time. He appears well-developed and well-nourished.  HENT:  Head: Normocephalic and atraumatic.  Eyes: EOM are normal. Pupils are equal, round, and reactive to light.  Neck: Normal range of motion. Neck supple.  Cardiovascular: Normal rate, regular  rhythm and normal heart sounds.   No murmur heard. Pulmonary/Chest: Effort normal and breath sounds normal. No respiratory distress. He has no wheezes. He has no rales. He exhibits no tenderness.  Abdominal: Soft. Normal appearance and bowel sounds are normal. There is generalized tenderness.  Musculoskeletal: Normal range of motion.  Neurological: He is alert and oriented to person, place, and time.  Skin: Skin is warm and dry.  Psychiatric: He has a normal mood and affect. His behavior is normal. Thought content normal.  Nursing note and vitals reviewed.  ED Course  Procedures (including critical care time) Labs Review Labs Reviewed  COMPREHENSIVE METABOLIC PANEL - Abnormal; Notable for the following:    Chloride 96 (*)    Glucose, Bld 116 (*)    BUN 40 (*)    Creatinine, Ser 9.29 (*)    Total Protein 8.7 (*)    Total Bilirubin 1.3 (*)    GFR calc non Af Amer 6 (*)    GFR calc Af Amer 7 (*)    All other components within normal limits  CBC - Abnormal; Notable for the following:    RBC 3.67 (*)    Hemoglobin 9.9 (*)    HCT 29.3 (*)    RDW 20.7 (*)    Platelets 104 (*)    All other components within normal limits  LIPASE, BLOOD   Imaging Review Ct Abdomen Pelvis W Contrast  10/08/2015  CLINICAL DATA:  55 year old male dialysis patient with right side abdominal pain since 2200 hours yesterday and fever. Hepatitis-B and [place]. Initial encounter. EXAM: CT ABDOMEN AND PELVIS WITH CONTRAST TECHNIQUE: Multidetector CT imaging of the abdomen and pelvis was performed using the standard protocol following bolus administration of intravenous contrast. CONTRAST:  144mL ISOVUE-300 IOPAMIDOL (ISOVUE-300) INJECTION 61% COMPARISON:  Abdomen ultrasound 12/23/2010. FINDINGS: Cardiomegaly. No pericardial effusion. Elevation of the left hemidiaphragm with basilar and dependent lower lobe pulmonary opacity bilaterally which most resembles atelectasis. No pleural effusion. Diffuse sclerosis of  osseous structures suggesting renal osteodystrophy. Advanced lumbosacral junction disc and endplate degeneration. No acute osseous abnormality identified. Advanced and diffuse calcified atherosclerosis of the aorta, and all major arterial structures throughout the abdomen and pelvis. Calcified atherosclerosis continues in the bilateral femoral arteries despite the extent of atherosclerotic disease major arterial structures remain patent. Small volume pelvic free fluid with simple fluid densitometry. Decompressed rectum and sigmoid colon. Decompressed urinary bladder. Distal small bowel and colonic loops in the pelvis are of similar caliber and difficult to differentiate. There is fluid in lipid material in distal bowel loops incidentally noted (coronal image 96). No abnormally dilated loops. The appendix is identified along the right pelvic side wall on series 9,  image 66 and appears within normal limits. Redundant large bowel containing stool, but still relatively decompressed of the splenic flexure. No abdominal free air. Stomach and duodenum largely are decompressed. Hepatomegaly with periportal edema centrally. Trace free fluid about the liver, primarily at the porta hepatis. No definite discrete liver lesion. The spleen, pancreas and adrenal glands are within normal limits. Native renal atrophy. As expected, no renal contrast excretion on delayed images. No definite lymphadenopathy in the abdomen or pelvis. IMPRESSION: 1. Nonspecific small volume of ascites in the abdomen and pelvis. Hepatomegaly with central periportal edema. Query acute on chronic hepatitis. 2. No evidence of bowel obstruction. Small and large bowel loops are difficult to differentiate in the lower abdomen and pelvis. 3. Severe diffuse calcified atherosclerosis of the aorta and all other major arterial structures in the abdomen and pelvis. Despite this major arterial structures remain patent. 4. Cardiomegaly.  Native renal atrophy. 5. Mild  lung base atelectasis. 6. Renal osteodystrophy. Electronically Signed   By: Genevie Ann M.D.   On: 10/08/2015 10:09   I have personally reviewed and evaluated these images and lab results as part of my medical decision-making.   EKG Interpretation None      MDM  I have reviewed and evaluated the relevant laboratory values. I have reviewed and evaluated the relevant imaging studies. I have reviewed the relevant previous healthcare records. I obtained HPI from historian. Patient discussed with supervising physician  ED Course:  Assessment: Pt is a 84yM with hx ESRD, Hepatitis C, who presents with generalized abdominal pain since last night around 10p. On exam, pt in NAD. Nontoxic/nonseptic appearing. VSS. Afebrile. Lungs CTA. Heart RRR. Abdomen TTP generally. Labs show normal lipase. Potassium WNL. Creatinine 9.29. CT ABD/Pelvis showed no acute abnormalities concerning for surgical abdomen. Given Dilaudid in ED. Pain controlled and patient sitting comfortably and speaking on phone. Plan is to DC home with follow up to Dialysis center for routine dialysis treatment today. Norco Rx 8 Tab. At time of discharge, Patient is in no acute distress. Vital Signs are stable. Patient is able to ambulate. Patient able to tolerate PO.   Disposition/Plan:  DC Home Additional Verbal discharge instructions given and discussed with patient.  Pt Instructed to f/u with PCP in the next 48 hours for evaluation and treatment of symptoms. Return precautions given Pt acknowledges and agrees with plan  Supervising Physician Rolland Porter, MD   Final diagnoses:  Generalized abdominal pain      Shary Decamp, PA-C 10/08/15 [place], MD 10/08/15 1031

## 2015-10-08 NOTE — ED Notes (Signed)
Patient transported to CT 

## 2015-10-09 DIAGNOSIS — N2581 Secondary hyperparathyroidism of renal origin: Secondary | ICD-10-CM | POA: Diagnosis not present

## 2015-10-09 DIAGNOSIS — D631 Anemia in chronic kidney disease: Secondary | ICD-10-CM | POA: Diagnosis not present

## 2015-10-09 DIAGNOSIS — D509 Iron deficiency anemia, unspecified: Secondary | ICD-10-CM | POA: Diagnosis not present

## 2015-10-09 DIAGNOSIS — N186 End stage renal disease: Secondary | ICD-10-CM | POA: Diagnosis not present

## 2015-10-10 DIAGNOSIS — N186 End stage renal disease: Secondary | ICD-10-CM | POA: Diagnosis not present

## 2015-10-10 DIAGNOSIS — D509 Iron deficiency anemia, unspecified: Secondary | ICD-10-CM | POA: Diagnosis not present

## 2015-10-10 DIAGNOSIS — D631 Anemia in chronic kidney disease: Secondary | ICD-10-CM | POA: Diagnosis not present

## 2015-10-10 DIAGNOSIS — N2581 Secondary hyperparathyroidism of renal origin: Secondary | ICD-10-CM | POA: Diagnosis not present

## 2015-10-10 DIAGNOSIS — Z992 Dependence on renal dialysis: Secondary | ICD-10-CM | POA: Diagnosis not present

## 2015-10-10 DIAGNOSIS — I120 Hypertensive chronic kidney disease with stage 5 chronic kidney disease or end stage renal disease: Secondary | ICD-10-CM | POA: Diagnosis not present

## 2015-10-13 DIAGNOSIS — D631 Anemia in chronic kidney disease: Secondary | ICD-10-CM | POA: Diagnosis not present

## 2015-10-13 DIAGNOSIS — D509 Iron deficiency anemia, unspecified: Secondary | ICD-10-CM | POA: Diagnosis not present

## 2015-10-13 DIAGNOSIS — N2581 Secondary hyperparathyroidism of renal origin: Secondary | ICD-10-CM | POA: Diagnosis not present

## 2015-10-13 DIAGNOSIS — N186 End stage renal disease: Secondary | ICD-10-CM | POA: Diagnosis not present

## 2015-10-14 ENCOUNTER — Ambulatory Visit
Admission: RE | Admit: 2015-10-14 | Discharge: 2015-10-14 | Disposition: A | Discharge status: Home / Self Care | Payer: Medicare Other | Source: Ambulatory Visit | Attending: Neurology | Admitting: Neurology

## 2015-10-14 DIAGNOSIS — S56199A Other injury of flexor muscle, fascia and tendon of unspecified finger at forearm level, initial encounter: Secondary | ICD-10-CM | POA: Diagnosis not present

## 2015-10-14 DIAGNOSIS — R202 Paresthesia of skin: Secondary | ICD-10-CM

## 2015-10-14 DIAGNOSIS — M79602 Pain in left arm: Secondary | ICD-10-CM

## 2015-10-15 DIAGNOSIS — D631 Anemia in chronic kidney disease: Secondary | ICD-10-CM | POA: Diagnosis not present

## 2015-10-15 DIAGNOSIS — D509 Iron deficiency anemia, unspecified: Secondary | ICD-10-CM | POA: Diagnosis not present

## 2015-10-15 DIAGNOSIS — N186 End stage renal disease: Secondary | ICD-10-CM | POA: Diagnosis not present

## 2015-10-15 DIAGNOSIS — N2581 Secondary hyperparathyroidism of renal origin: Secondary | ICD-10-CM | POA: Diagnosis not present

## 2015-10-17 DIAGNOSIS — D509 Iron deficiency anemia, unspecified: Secondary | ICD-10-CM | POA: Diagnosis not present

## 2015-10-17 DIAGNOSIS — N2581 Secondary hyperparathyroidism of renal origin: Secondary | ICD-10-CM | POA: Diagnosis not present

## 2015-10-17 DIAGNOSIS — D631 Anemia in chronic kidney disease: Secondary | ICD-10-CM | POA: Diagnosis not present

## 2015-10-17 DIAGNOSIS — N186 End stage renal disease: Secondary | ICD-10-CM | POA: Diagnosis not present

## 2015-10-20 ENCOUNTER — Telehealth: Payer: Self-pay | Admitting: *Deleted

## 2015-10-20 ENCOUNTER — Telehealth: Payer: Self-pay | Admitting: Neurology

## 2015-10-20 DIAGNOSIS — N186 End stage renal disease: Secondary | ICD-10-CM | POA: Diagnosis not present

## 2015-10-20 DIAGNOSIS — D509 Iron deficiency anemia, unspecified: Secondary | ICD-10-CM | POA: Diagnosis not present

## 2015-10-20 DIAGNOSIS — N2581 Secondary hyperparathyroidism of renal origin: Secondary | ICD-10-CM | POA: Diagnosis not present

## 2015-10-20 DIAGNOSIS — D631 Anemia in chronic kidney disease: Secondary | ICD-10-CM | POA: Diagnosis not present

## 2015-10-20 DIAGNOSIS — M6789 Other specified disorders of synovium and tendon, multiple sites: Secondary | ICD-10-CM

## 2015-10-20 NOTE — Telephone Encounter (Signed)
-----   Message from Melvenia Beam, MD sent at 10/18/2015  5:16 PM EDT ----- Terrence Dupont, the [facility] of the arm showed some tendon inflammation (tendinosis) with a partial tear of the common extensor tendon origin. I wonder if this is causing him the pain. We could refer him to orthopaedics for further evaluation if he likes. Let me know and I can place the order thanks.

## 2015-10-20 NOTE — Telephone Encounter (Signed)
Called patient and relayed to patient dew to his insurance . His PCP will have to submit referral . Patient understood details and I called PCP office with instructions.

## 2015-10-20 NOTE — Telephone Encounter (Signed)
Dr Jaynee Eagles- FYI Called and spoke to pt about MRI arm results per Dr Jaynee Eagles. He would like referral to orthopaedics to further evaluate. Advised we will place referral and he should hear within a week or two to schedule appt on their end. If not, he is to call me back. He verbalized understanding and appreciation.   Placed referral to orthopaedic.

## 2015-10-22 DIAGNOSIS — N2581 Secondary hyperparathyroidism of renal origin: Secondary | ICD-10-CM | POA: Diagnosis not present

## 2015-10-22 DIAGNOSIS — D509 Iron deficiency anemia, unspecified: Secondary | ICD-10-CM | POA: Diagnosis not present

## 2015-10-22 DIAGNOSIS — D631 Anemia in chronic kidney disease: Secondary | ICD-10-CM | POA: Diagnosis not present

## 2015-10-22 DIAGNOSIS — N186 End stage renal disease: Secondary | ICD-10-CM | POA: Diagnosis not present

## 2015-10-24 DIAGNOSIS — N2581 Secondary hyperparathyroidism of renal origin: Secondary | ICD-10-CM | POA: Diagnosis not present

## 2015-10-24 DIAGNOSIS — N186 End stage renal disease: Secondary | ICD-10-CM | POA: Diagnosis not present

## 2015-10-24 DIAGNOSIS — D509 Iron deficiency anemia, unspecified: Secondary | ICD-10-CM | POA: Diagnosis not present

## 2015-10-24 DIAGNOSIS — D631 Anemia in chronic kidney disease: Secondary | ICD-10-CM | POA: Diagnosis not present

## 2015-10-27 DIAGNOSIS — N186 End stage renal disease: Secondary | ICD-10-CM | POA: Diagnosis not present

## 2015-10-27 DIAGNOSIS — N2581 Secondary hyperparathyroidism of renal origin: Secondary | ICD-10-CM | POA: Diagnosis not present

## 2015-10-27 DIAGNOSIS — D509 Iron deficiency anemia, unspecified: Secondary | ICD-10-CM | POA: Diagnosis not present

## 2015-10-27 DIAGNOSIS — D631 Anemia in chronic kidney disease: Secondary | ICD-10-CM | POA: Diagnosis not present

## 2015-10-29 DIAGNOSIS — N2581 Secondary hyperparathyroidism of renal origin: Secondary | ICD-10-CM | POA: Diagnosis not present

## 2015-10-29 DIAGNOSIS — N186 End stage renal disease: Secondary | ICD-10-CM | POA: Diagnosis not present

## 2015-10-29 DIAGNOSIS — D509 Iron deficiency anemia, unspecified: Secondary | ICD-10-CM | POA: Diagnosis not present

## 2015-10-29 DIAGNOSIS — D631 Anemia in chronic kidney disease: Secondary | ICD-10-CM | POA: Diagnosis not present

## 2015-10-31 DIAGNOSIS — N186 End stage renal disease: Secondary | ICD-10-CM | POA: Diagnosis not present

## 2015-10-31 DIAGNOSIS — D509 Iron deficiency anemia, unspecified: Secondary | ICD-10-CM | POA: Diagnosis not present

## 2015-10-31 DIAGNOSIS — N2581 Secondary hyperparathyroidism of renal origin: Secondary | ICD-10-CM | POA: Diagnosis not present

## 2015-10-31 DIAGNOSIS — D631 Anemia in chronic kidney disease: Secondary | ICD-10-CM | POA: Diagnosis not present

## 2015-11-03 DIAGNOSIS — D631 Anemia in chronic kidney disease: Secondary | ICD-10-CM | POA: Diagnosis not present

## 2015-11-03 DIAGNOSIS — Z13220 Encounter for screening for lipoid disorders: Secondary | ICD-10-CM | POA: Diagnosis not present

## 2015-11-03 DIAGNOSIS — F17200 Nicotine dependence, unspecified, uncomplicated: Secondary | ICD-10-CM | POA: Diagnosis not present

## 2015-11-03 DIAGNOSIS — N2581 Secondary hyperparathyroidism of renal origin: Secondary | ICD-10-CM | POA: Diagnosis not present

## 2015-11-03 DIAGNOSIS — N186 End stage renal disease: Secondary | ICD-10-CM | POA: Diagnosis not present

## 2015-11-03 DIAGNOSIS — J449 Chronic obstructive pulmonary disease, unspecified: Secondary | ICD-10-CM | POA: Diagnosis not present

## 2015-11-03 DIAGNOSIS — M25519 Pain in unspecified shoulder: Secondary | ICD-10-CM | POA: Diagnosis not present

## 2015-11-03 DIAGNOSIS — I10 Essential (primary) hypertension: Secondary | ICD-10-CM | POA: Diagnosis not present

## 2015-11-03 DIAGNOSIS — Z131 Encounter for screening for diabetes mellitus: Secondary | ICD-10-CM | POA: Diagnosis not present

## 2015-11-03 DIAGNOSIS — D509 Iron deficiency anemia, unspecified: Secondary | ICD-10-CM | POA: Diagnosis not present

## 2015-11-05 DIAGNOSIS — N186 End stage renal disease: Secondary | ICD-10-CM | POA: Diagnosis not present

## 2015-11-05 DIAGNOSIS — N2581 Secondary hyperparathyroidism of renal origin: Secondary | ICD-10-CM | POA: Diagnosis not present

## 2015-11-05 DIAGNOSIS — D631 Anemia in chronic kidney disease: Secondary | ICD-10-CM | POA: Diagnosis not present

## 2015-11-05 DIAGNOSIS — D509 Iron deficiency anemia, unspecified: Secondary | ICD-10-CM | POA: Diagnosis not present

## 2015-11-07 DIAGNOSIS — D509 Iron deficiency anemia, unspecified: Secondary | ICD-10-CM | POA: Diagnosis not present

## 2015-11-07 DIAGNOSIS — D631 Anemia in chronic kidney disease: Secondary | ICD-10-CM | POA: Diagnosis not present

## 2015-11-07 DIAGNOSIS — N186 End stage renal disease: Secondary | ICD-10-CM | POA: Diagnosis not present

## 2015-11-07 DIAGNOSIS — N2581 Secondary hyperparathyroidism of renal origin: Secondary | ICD-10-CM | POA: Diagnosis not present

## 2015-11-09 DIAGNOSIS — Z992 Dependence on renal dialysis: Secondary | ICD-10-CM | POA: Diagnosis not present

## 2015-11-09 DIAGNOSIS — I120 Hypertensive chronic kidney disease with stage 5 chronic kidney disease or end stage renal disease: Secondary | ICD-10-CM | POA: Diagnosis not present

## 2015-11-09 DIAGNOSIS — N186 End stage renal disease: Secondary | ICD-10-CM | POA: Diagnosis not present

## 2015-11-10 DIAGNOSIS — D509 Iron deficiency anemia, unspecified: Secondary | ICD-10-CM | POA: Diagnosis not present

## 2015-11-10 DIAGNOSIS — N2581 Secondary hyperparathyroidism of renal origin: Secondary | ICD-10-CM | POA: Diagnosis not present

## 2015-11-10 DIAGNOSIS — D631 Anemia in chronic kidney disease: Secondary | ICD-10-CM | POA: Diagnosis not present

## 2015-11-10 DIAGNOSIS — N186 End stage renal disease: Secondary | ICD-10-CM | POA: Diagnosis not present

## 2015-11-12 DIAGNOSIS — M542 Cervicalgia: Secondary | ICD-10-CM | POA: Diagnosis not present

## 2015-11-12 DIAGNOSIS — M25512 Pain in left shoulder: Secondary | ICD-10-CM | POA: Diagnosis not present

## 2015-11-25 ENCOUNTER — Encounter: Payer: Self-pay | Admitting: Vascular Surgery

## 2015-12-02 ENCOUNTER — Encounter: Payer: Self-pay | Admitting: Vascular Surgery

## 2015-12-02 ENCOUNTER — Ambulatory Visit (INDEPENDENT_AMBULATORY_CARE_PROVIDER_SITE_OTHER): Payer: Medicare Other | Admitting: Vascular Surgery

## 2015-12-02 VITALS — BP 157/71 | HR 75 | Temp 97.6°F | Resp 22 | Ht 69.0 in | Wt 155.0 lb

## 2015-12-02 DIAGNOSIS — N186 End stage renal disease: Secondary | ICD-10-CM

## 2015-12-02 NOTE — Progress Notes (Signed)
Subjective:     Patient ID: [name], male   DOB: 06/16/1961, 55 y.o.   MRN: [id number]  HPI this 55 year old male was referred by Dr. Otelia Santee for evaluation of a scab on the left brachial-cephalic AV fistula. Patient's fistula is functioning nicely. Patient has been having pain in the arm with occasional coldness down the forearm area and the hand and also pain up into the shoulder and occasionally into the left chest with shortness of breath. He states that this is due to the fistula "pushing on a nerve". He was told this by an orthopedic surgeon who evaluated him. Dr. Augustin Coupe referred him to evaluate removal of the scab and revision of the fistula. I discussed this at length with the patient but he refused to agree to have the scab removed stating that he wants the fistula "moved" or tied off.  Past Medical History  Diagnosis Date  . Chronic kidney disease   . Hypertension   . Hepatitis B, chronic ([place])   . Hepatitis C   . Anemia   . Metabolic bone disease   . Renal disorder   . Chest pain     DATE UNKNOWN, C/O PERIODICALLY  . GERD (gastroesophageal reflux disease)     DATE UNKNOWN  . Headache(784.0)     OCCASIONALLY  . Dialysis patient ([place])     Monday-Wednesday-Friday  . Hyperkalemia   . Pulmonary edema   . Hemorrhoids   . Adenomatous colon polyp     tubular  . Myocardial infarction ([place])   . Renal insufficiency     Social History  Substance Use Topics  . Smoking status: Current Every Day Smoker -- 0.75 packs/day for 37 years    Types: Cigarettes  . Smokeless tobacco: Never Used  . Alcohol Use: No     Comment: 1 beer/2 weeks.     Family History  Problem Relation Age of Onset  . Heart disease Mother   . Lung cancer Mother   . Heart disease Father   . Malignant hyperthermia Father   . Hypertension Other   . COPD Other   . Colon cancer Neg Hx   . Throat cancer Sister     Allergies  Allergen Reactions  . Aspirin   . Clonidine Derivatives Other (See  Comments)    Clonidine patch causes stomach pain  . Tramadol Itching     Current outpatient prescriptions:  .  amLODipine (NORVASC) 10 MG tablet, Take 10 mg by mouth at bedtime. , Disp: , Rfl:  .  carvedilol (COREG) 25 MG tablet, Take 25 mg by mouth 2 (two) times daily with a meal., Disp: , Rfl:  .  cinacalcet (SENSIPAR) 30 MG tablet, Take 30 mg by mouth daily., Disp: , Rfl:  .  DULERA 100-5 MCG/ACT AERO, Inhale 2 puffs into the lungs 2 (two) times daily. , Disp: , Rfl:  .  HYDROcodone-acetaminophen (NORCO/VICODIN) 5-325 MG tablet, Take 1 tablet by mouth every 6 (six) hours as needed for moderate pain., Disp: 30 tablet, Rfl: 0 .  HYDROcodone-acetaminophen (NORCO/VICODIN) 5-325 MG tablet, Take 1 tablet by mouth every 6 (six) hours as needed., Disp: 8 tablet, Rfl: 0 .  lisinopril (PRINIVIL,ZESTRIL) 20 MG tablet, Take 40 mg by mouth daily. , Disp: , Rfl:  .  minoxidil (LONITEN) 10 MG tablet, Take 5 mg by mouth 2 (two) times daily. , Disp: , Rfl:  .  polyethylene glycol powder (GLYCOLAX/MIRALAX) powder, Take 17 g by mouth daily., Disp: 527 g, Rfl:  11 .  RENVELA 800 MG tablet, Take 1,600-3,200 mg by mouth 3 (three) times daily with meals. Take 4 tablets (3200 mg) with each meal and 2 (1600 mg) tablets with snacks., Disp: , Rfl:  .  hydrALAZINE (APRESOLINE) 25 MG tablet, Take 1 tablet (25 mg total) by mouth every 8 (eight) hours. (Patient not taking: Reported on 09/25/2015), Disp: 90 tablet, Rfl: 1  Filed Vitals:   12/02/15 0940  BP: 157/71  Pulse: 75  Temp: 97.6 F (36.4 C)  TempSrc: Oral  Resp: 22  Height: 5\' 9"  (1.753 m)  Weight: 155 lb (70.308 kg)  SpO2: 100%    Body mass index is 22.88 kg/(m^2).           Review of Systems denies chest pain but does have dyspnea on exertion and occasional orthopnea. Has left arm symptoms-see history of present illness.     Objective:   Physical Exam BP 157/71 mmHg  Pulse 75  Temp(Src) 97.6 F (36.4 C) (Oral)  Resp 22  Ht 5\' 9"  (1.753  m)  Wt 155 lb (70.308 kg)  BMI 22.88 kg/m2  SpO2 100%  Gen. thin alert male in no apparent distress alert and oriented 3 Lungs no rhonchi or wheezing Left upper extremity with brachial-cephalic AV fistula which is dilated throughout and very tortuous. There is an eschar over the very distal aspect and about 5 cm from the arterial anastomosis. This eschar measures about a centimeter and a half in diameter. There is no purulence drainage or evidence of any bleeding. There is 2+ radial pulse palpable distally. The left hand is well perfused.     Assessment:     Diffusely dilated left brachial-cephalic AV fistula which has eschar overlying it distally Left arm pain and shoulder and chest discomfort with occasional shortness of breath    Plan:     Patient would not agree to revision of the fistula with removal of the eschar. He feels that the fistula is the problem causing his pain because it "pushing on a nerve" I discussed this with Dr. Augustin Coupe and the fact that the patient and I could not come to agreement on what to do I did not recommend ligation of the fistula as I do not think this will remove any symptoms he is experiencing  Patient is to meet with Dr. Maxie Better and come up with a plan before we proceed with any further surgery. If we get rid of the fistula it would require ligation of the fistula with insertion of temporary catheter and later creation of fistula in the contralateral right arm

## 2015-12-10 DIAGNOSIS — Z992 Dependence on renal dialysis: Secondary | ICD-10-CM | POA: Diagnosis not present

## 2015-12-10 DIAGNOSIS — N186 End stage renal disease: Secondary | ICD-10-CM | POA: Diagnosis not present

## 2015-12-10 DIAGNOSIS — I120 Hypertensive chronic kidney disease with stage 5 chronic kidney disease or end stage renal disease: Secondary | ICD-10-CM | POA: Diagnosis not present

## 2015-12-12 DIAGNOSIS — D509 Iron deficiency anemia, unspecified: Secondary | ICD-10-CM | POA: Diagnosis not present

## 2015-12-12 DIAGNOSIS — D631 Anemia in chronic kidney disease: Secondary | ICD-10-CM | POA: Diagnosis not present

## 2015-12-12 DIAGNOSIS — N2581 Secondary hyperparathyroidism of renal origin: Secondary | ICD-10-CM | POA: Diagnosis not present

## 2015-12-12 DIAGNOSIS — N186 End stage renal disease: Secondary | ICD-10-CM | POA: Diagnosis not present

## 2015-12-13 ENCOUNTER — Encounter (HOSPITAL_COMMUNITY): Payer: Self-pay | Admitting: *Deleted

## 2015-12-13 ENCOUNTER — Ambulatory Visit (HOSPITAL_COMMUNITY)
Admission: EM | Admit: 2015-12-13 | Discharge: 2015-12-14 | Disposition: A | Discharge status: Home / Self Care | Payer: Medicare Other | Attending: Emergency Medicine | Admitting: Emergency Medicine

## 2015-12-13 DIAGNOSIS — T82898A Other specified complication of vascular prosthetic devices, implants and grafts, initial encounter: Secondary | ICD-10-CM | POA: Diagnosis present

## 2015-12-13 DIAGNOSIS — K219 Gastro-esophageal reflux disease without esophagitis: Secondary | ICD-10-CM | POA: Diagnosis not present

## 2015-12-13 DIAGNOSIS — I770 Arteriovenous fistula, acquired: Secondary | ICD-10-CM | POA: Diagnosis not present

## 2015-12-13 DIAGNOSIS — T82838A Hemorrhage of vascular prosthetic devices, implants and grafts, initial encounter: Secondary | ICD-10-CM

## 2015-12-13 DIAGNOSIS — B1920 Unspecified viral hepatitis C without hepatic coma: Secondary | ICD-10-CM | POA: Diagnosis not present

## 2015-12-13 DIAGNOSIS — I129 Hypertensive chronic kidney disease with stage 1 through stage 4 chronic kidney disease, or unspecified chronic kidney disease: Secondary | ICD-10-CM | POA: Insufficient documentation

## 2015-12-13 DIAGNOSIS — F17210 Nicotine dependence, cigarettes, uncomplicated: Secondary | ICD-10-CM | POA: Diagnosis not present

## 2015-12-13 DIAGNOSIS — Z992 Dependence on renal dialysis: Secondary | ICD-10-CM | POA: Insufficient documentation

## 2015-12-13 DIAGNOSIS — N189 Chronic kidney disease, unspecified: Secondary | ICD-10-CM | POA: Insufficient documentation

## 2015-12-13 DIAGNOSIS — I252 Old myocardial infarction: Secondary | ICD-10-CM | POA: Diagnosis not present

## 2015-12-13 DIAGNOSIS — R030 Elevated blood-pressure reading, without diagnosis of hypertension: Secondary | ICD-10-CM | POA: Diagnosis not present

## 2015-12-13 DIAGNOSIS — Y832 Surgical operation with anastomosis, bypass or graft as the cause of abnormal reaction of the patient, or of later complication, without mention of misadventure at the time of the procedure: Secondary | ICD-10-CM | POA: Diagnosis not present

## 2015-12-13 DIAGNOSIS — S41109A Unspecified open wound of unspecified upper arm, initial encounter: Secondary | ICD-10-CM | POA: Diagnosis not present

## 2015-12-13 LAB — BASIC METABOLIC PANEL
Anion gap: 14 (ref 5–15)
BUN: 36 mg/dL — ABNORMAL HIGH (ref 6–20)
CO2: 22 mmol/L (ref 22–32)
Calcium: 8.9 mg/dL (ref 8.9–10.3)
Chloride: 94 mmol/L — ABNORMAL LOW (ref 101–111)
Creatinine, Ser: 7.99 mg/dL — ABNORMAL HIGH (ref 0.61–1.24)
GFR calc Af Amer: 8 mL/min — ABNORMAL LOW (ref >60)
GFR calc non Af Amer: 7 mL/min — ABNORMAL LOW (ref >60)
Glucose, Bld: 116 mg/dL — ABNORMAL HIGH (ref 65–99)
Potassium: 3.5 mmol/L (ref 3.5–5.1)
Sodium: 130 mmol/L — ABNORMAL LOW (ref 135–145)

## 2015-12-13 LAB — ETHANOL: Alcohol, Ethyl (B): <5 mg/dL (ref <5)

## 2015-12-13 MED ORDER — CARVEDILOL 12.5 MG PO TABS
25.0000 mg | ORAL_TABLET | Freq: Once | ORAL | Status: AC
Start: 2015-12-13 — End: 2015-12-13
  Administered 2015-12-13: 25 mg via ORAL
  Filled 2015-12-13: qty 2

## 2015-12-13 MED ORDER — AMLODIPINE BESYLATE 5 MG PO TABS
10.0000 mg | ORAL_TABLET | Freq: Once | ORAL | Status: AC
  Administered 2015-12-13: 10 mg via ORAL
  Filled 2015-12-13: qty 2

## 2015-12-13 MED ORDER — HYDRALAZINE HCL 25 MG PO TABS
25.0000 mg | ORAL_TABLET | Freq: Once | ORAL | Status: AC
  Administered 2015-12-13: 25 mg via ORAL
  Filled 2015-12-13: qty 1

## 2015-12-13 MED ORDER — LISINOPRIL 20 MG PO TABS
40.0000 mg | ORAL_TABLET | Freq: Once | ORAL | Status: AC
  Administered 2015-12-13: 40 mg via ORAL
  Filled 2015-12-13: qty 2

## 2015-12-13 NOTE — ED Notes (Signed)
Pt to ED by EMS c/o bleeding at dialysis fistula. Fistula has been in place around 7 years and is due for a new fistula. Bleeding controlled on arrival. Pt noted to be hypertensive, with hx of same, has not taken blood pressure medications today.

## 2015-12-13 NOTE — ED Provider Notes (Signed)
CSN: [id number]     Arrival date & time 12/13/15  2311 History  By signing my name below, I, Rowan Blase, attest that this documentation has been prepared under the direction and in the presence of Wandra Arthurs, MD . Electronically Signed: Rowan Blase, Scribe. 12/13/2015. 11:19 PM.    Chief Complaint  Patient presents with  . Vascular Access Problem   The history is provided by the patient and the EMS personnel. No language interpreter was used.   HPI Comments:  ATHEN BALAGOT is a 55 y.o. male with PMHx of CKD, HTN, hepatitis B and hepatitis C who presents to the Emergency Department via EMS complaining of bleeding fistula in left inner elbow. Pt reports he had a scab over the area that fell off. His fistula has been in place for 6-7 years; pt reports his doctor is considering revising the fistula. Last dialysis was yesterday. Per EMS pt's BP was 210/110; he takes lisinopril regularly but did not take it today. Per EMS pt has had 2 16 oz beers and smoked marijuana tonight; pt reports he also smoked crack today. Denies chest pain.   Past Medical History  Diagnosis Date  . Chronic kidney disease   . Hypertension   . Hepatitis B, chronic ([place])   . Hepatitis C   . Anemia   . Metabolic bone disease   . Renal disorder   . Chest pain     DATE UNKNOWN, C/O PERIODICALLY  . GERD (gastroesophageal reflux disease)     DATE UNKNOWN  . Headache(784.0)     OCCASIONALLY  . Dialysis patient ([place])     Monday-Wednesday-Friday  . Hyperkalemia   . Pulmonary edema   . Hemorrhoids   . Adenomatous colon polyp     tubular  . Myocardial infarction ([place])   . Renal insufficiency    Past Surgical History  Procedure Laterality Date  . Av fistula placement  2012    BELIEVED WAS PLACED IN [place]  . Hemorrhoid banding     Family History  Problem Relation Age of Onset  . Heart disease Mother   . Lung cancer Mother   . Heart disease Father   . Malignant hyperthermia Father   . Hypertension  Other   . COPD Other   . Colon cancer Neg Hx   . Throat cancer Sister    Social History  Substance Use Topics  . Smoking status: Current Every Day Smoker -- 0.75 packs/day for 37 years    Types: Cigarettes  . Smokeless tobacco: Never Used  . Alcohol Use: No     Comment: 1 beer/2 weeks.     Review of Systems  Cardiovascular: Negative for chest pain.  Skin: Positive for wound.  All other systems reviewed and are negative.   Allergies  Aspirin; Clonidine derivatives; and Tramadol  Home Medications   Prior to Admission medications   Medication Sig Start Date End Date Taking? Authorizing Provider  amLODipine (NORVASC) 10 MG tablet Take 10 mg by mouth at bedtime.     Historical Provider, MD  carvedilol (COREG) 25 MG tablet Take 25 mg by mouth 2 (two) times daily with a meal.    Historical Provider, MD  cinacalcet (SENSIPAR) 30 MG tablet Take 30 mg by mouth daily.    Historical Provider, MD  DULERA 100-5 MCG/ACT AERO Inhale 2 puffs into the lungs 2 (two) times daily.  09/22/15   Historical Provider, MD  hydrALAZINE (APRESOLINE) 25 MG tablet Take 1 tablet (25 mg  total) by mouth every 8 (eight) hours. Patient not taking: Reported on 09/25/2015 02/28/14   Adrian Prows, MD  HYDROcodone-acetaminophen (NORCO/VICODIN) 5-325 MG tablet Take 1 tablet by mouth every 6 (six) hours as needed for moderate pain. 09/25/15   Melvenia Beam, MD  HYDROcodone-acetaminophen (NORCO/VICODIN) 5-325 MG tablet Take 1 tablet by mouth every 6 (six) hours as needed. 10/08/15   Shary Decamp, PA-C  lisinopril (PRINIVIL,ZESTRIL) 20 MG tablet Take 40 mg by mouth daily.  09/07/12   Historical Provider, MD  minoxidil (LONITEN) 10 MG tablet Take 5 mg by mouth 2 (two) times daily.  07/17/13   Historical Provider, MD  polyethylene glycol powder (GLYCOLAX/MIRALAX) powder Take 17 g by mouth daily. 03/05/14   Gatha Mayer, MD  RENVELA 800 MG tablet Take 1,600-3,200 mg by mouth 3 (three) times daily with meals. Take 4 tablets (3200 mg)  with each meal and 2 (1600 mg) tablets with snacks. 07/09/13   Historical Provider, MD   BP 230/95 mmHg  Pulse 89  Temp(Src) 97.5 F (36.4 C) (Oral)  Resp 22  SpO2 96%   Physical Exam  Constitutional: No distress.  Chronically ill   HENT:  Head: Normocephalic and atraumatic.  Mouth/Throat: Oropharynx is clear and moist. No oropharyngeal exudate.  Eyes: Conjunctivae and EOM are normal. Pupils are equal, round, and reactive to light. Right eye exhibits no discharge. Left eye exhibits no discharge. No scleral icterus.  Neck: Normal range of motion. Neck supple. No JVD present. No thyromegaly present.  Cardiovascular: Normal rate, regular rhythm, normal heart sounds and intact distal pulses.  Exam reveals no gallop and no friction rub.   No murmur heard. Pulmonary/Chest: Effort normal and breath sounds normal. No respiratory distress. He has no wheezes. He has no rales.  Abdominal: Soft. Bowel sounds are normal. He exhibits no distension and no mass. There is no tenderness.  Musculoskeletal: Normal range of motion. He exhibits no edema or tenderness.  Lymphadenopathy:    He has no cervical adenopathy.  Neurological: He is alert. Coordination normal.  Skin: Skin is warm and dry. No rash noted. No erythema.  Left dialysis fistula with dressing in place; Bleeding controlled at this time  Psychiatric: He has a normal mood and affect. His behavior is normal.  Nursing note and vitals reviewed.   ED Course  Procedures  DIAGNOSTIC STUDIES:  Oxygen Saturation is 100% on RA, normal by my interpretation.    COORDINATION OF CARE:  11:15 PM Will administer blood pressure medication and recheck wound in ~1 hour. Discussed treatment plan with pt at bedside and pt agreed to plan.  Labs Review Labs Reviewed  CBC WITH DIFFERENTIAL/PLATELET - Abnormal; Notable for the following:    Hemoglobin 11.6 (*)    HCT 33.9 (*)    RDW 18.6 (*)    All other components within normal limits  BASIC  METABOLIC PANEL - Abnormal; Notable for the following:    Sodium 130 (*)    Chloride 94 (*)    Glucose, Bld 116 (*)    BUN 36 (*)    Creatinine, Ser 7.99 (*)    GFR calc non Af Amer 7 (*)    GFR calc Af Amer 8 (*)    All other components within normal limits  ETHANOL    Imaging Review No results found. I have personally reviewed and evaluated these images and lab results as part of my medical decision-making.   EKG Interpretation None      MDM  Final diagnoses:  None   LEVEN DROESSLER is a 56 y.o. male here with bleeding L AV fistula. Suppose to get outpatient revision surgery but patient didn't get it yet. Also uncompliant with his BP meds. Will give him BP meds first and then unwrap the gauze and see where the bleeding is coming from.   12 am BP improved to 200 from 230 after BP meds. I unwrapped the gauze and saw an arterial bleed. Tried quick clot with not much improvement. Will give IV hydralazine and recheck.   1 am He has bleeding through the dressing. BP now 180s after hydralazine. I applied surgicel but unable to control bleeding. I called Dr. Oneida Alar from vascular surgery.   2 am Dr. Oneida Alar saw patient. Will take him to the OR to control the bleeding. Preop labs drawn and labs at baseline.    I personally performed the services described in this documentation, which was scribed in my presence. The recorded information has been reviewed and is accurate.   Wandra Arthurs, MD 12/14/15 [phone]

## 2015-12-14 ENCOUNTER — Emergency Department (HOSPITAL_COMMUNITY): Payer: Medicare Other | Admitting: Certified Registered"

## 2015-12-14 ENCOUNTER — Encounter (HOSPITAL_COMMUNITY)
Admission: EM | Disposition: A | Discharge status: Home / Self Care | Payer: Self-pay | Source: Home / Self Care | Attending: Emergency Medicine

## 2015-12-14 ENCOUNTER — Encounter (HOSPITAL_COMMUNITY): Payer: Self-pay | Admitting: Anesthesiology

## 2015-12-14 DIAGNOSIS — D649 Anemia, unspecified: Secondary | ICD-10-CM | POA: Diagnosis not present

## 2015-12-14 DIAGNOSIS — T82898A Other specified complication of vascular prosthetic devices, implants and grafts, initial encounter: Secondary | ICD-10-CM | POA: Diagnosis not present

## 2015-12-14 DIAGNOSIS — B1920 Unspecified viral hepatitis C without hepatic coma: Secondary | ICD-10-CM | POA: Diagnosis not present

## 2015-12-14 DIAGNOSIS — K219 Gastro-esophageal reflux disease without esophagitis: Secondary | ICD-10-CM | POA: Diagnosis not present

## 2015-12-14 DIAGNOSIS — I252 Old myocardial infarction: Secondary | ICD-10-CM | POA: Diagnosis not present

## 2015-12-14 DIAGNOSIS — T82838A Hemorrhage of vascular prosthetic devices, implants and grafts, initial encounter: Secondary | ICD-10-CM | POA: Diagnosis not present

## 2015-12-14 DIAGNOSIS — I129 Hypertensive chronic kidney disease with stage 1 through stage 4 chronic kidney disease, or unspecified chronic kidney disease: Secondary | ICD-10-CM | POA: Diagnosis not present

## 2015-12-14 HISTORY — PX: LIGATION OF ARTERIOVENOUS  FISTULA: SHX5948

## 2015-12-14 LAB — CBC WITH DIFFERENTIAL/PLATELET
Basophils Absolute: 0 10*3/uL (ref 0.0–0.1)
Basophils Relative: 0 %
Eosinophils Absolute: 0.1 10*3/uL (ref 0.0–0.7)
Eosinophils Relative: 1 %
HCT: 33.9 % — ABNORMAL LOW (ref 39.0–52.0)
Hemoglobin: 11.6 g/dL — ABNORMAL LOW (ref 13.0–17.0)
Lymphocytes Relative: 29 %
Lymphs Abs: 2.2 10*3/uL (ref 0.7–4.0)
MCH: 27.2 pg (ref 26.0–34.0)
MCHC: 34.2 g/dL (ref 30.0–36.0)
MCV: 79.6 fL (ref 78.0–100.0)
Monocytes Absolute: 0.4 10*3/uL (ref 0.1–1.0)
Monocytes Relative: 6 %
Neutro Abs: 4.8 10*3/uL (ref 1.7–7.7)
Neutrophils Relative %: 64 %
Platelets: 83 10*3/uL — ABNORMAL LOW (ref 150–400)
RBC: 4.26 MIL/uL (ref 4.22–5.81)
RDW: 18.6 % — ABNORMAL HIGH (ref 11.5–15.5)
WBC: 7.6 10*3/uL (ref 4.0–10.5)

## 2015-12-14 LAB — TYPE AND SCREEN
ABO/RH(D): O NEG
Antibody Screen: NEGATIVE

## 2015-12-14 LAB — ABO/RH: ABO/RH(D): O NEG

## 2015-12-14 SURGERY — LIGATION OF ARTERIOVENOUS  FISTULA
Anesthesia: General | Laterality: Left

## 2015-12-14 MED ORDER — SUFENTANIL CITRATE 50 MCG/ML IV SOLN
INTRAVENOUS | Status: DC | PRN
  Administered 2015-12-14 (×3): 5 ug via INTRAVENOUS

## 2015-12-14 MED ORDER — OXYCODONE-ACETAMINOPHEN 5-325 MG PO TABS: 2.0000 | ORAL_TABLET | Freq: Four times a day (QID) | ORAL | Status: DC | PRN

## 2015-12-14 MED ORDER — EPHEDRINE SULFATE 50 MG/ML IJ SOLN
INTRAMUSCULAR | Status: DC | PRN
  Administered 2015-12-14: 5 mg via INTRAVENOUS
  Administered 2015-12-14 (×2): 10 mg via INTRAVENOUS
  Administered 2015-12-14: 5 mg via INTRAVENOUS
  Administered 2015-12-14: 10 mg via INTRAVENOUS

## 2015-12-14 MED ORDER — 0.9 % SODIUM CHLORIDE (POUR BTL) OPTIME
TOPICAL | Status: DC | PRN
  Administered 2015-12-14: 1000 mL

## 2015-12-14 MED ORDER — HEPARIN SODIUM (PORCINE) 1000 UNIT/ML IJ SOLN
INTRAMUSCULAR | Status: DC | PRN
  Administered 2015-12-14: 5000 [IU] via INTRAVENOUS

## 2015-12-14 MED ORDER — SUCCINYLCHOLINE CHLORIDE 20 MG/ML IJ SOLN
INTRAMUSCULAR | Status: DC | PRN
  Administered 2015-12-14: 100 mg via INTRAVENOUS

## 2015-12-14 MED ORDER — GELATIN ABSORBABLE 12-7 MM EX MISC
1.0000 | Freq: Once | CUTANEOUS | Status: AC
  Administered 2015-12-14: 1 via TOPICAL
  Filled 2015-12-14: qty 1

## 2015-12-14 MED ORDER — SODIUM CHLORIDE 0.9 % IV SOLN
INTRAVENOUS | Status: DC | PRN
  Administered 2015-12-14 (×2): via INTRAVENOUS

## 2015-12-14 MED ORDER — OXYCODONE HCL 5 MG/5ML PO SOLN: 5.0000 mg | Freq: Once | ORAL | Status: DC | PRN

## 2015-12-14 MED ORDER — IOPAMIDOL (ISOVUE-300) INJECTION 61%
INTRAVENOUS | Status: AC
  Filled 2015-12-14: qty 50

## 2015-12-14 MED ORDER — PROPOFOL 10 MG/ML IV BOLUS
INTRAVENOUS | Status: AC
  Filled 2015-12-14: qty 20

## 2015-12-14 MED ORDER — FENTANYL CITRATE (PF) 100 MCG/2ML IJ SOLN: 25.0000 ug | INTRAMUSCULAR | Status: DC | PRN

## 2015-12-14 MED ORDER — ONDANSETRON HCL 4 MG/2ML IJ SOLN
INTRAMUSCULAR | Status: DC | PRN
  Administered 2015-12-14: 4 mg via INTRAVENOUS

## 2015-12-14 MED ORDER — PROPOFOL 10 MG/ML IV BOLUS
INTRAVENOUS | Status: DC | PRN
  Administered 2015-12-14: 150 mg via INTRAVENOUS

## 2015-12-14 MED ORDER — LIDOCAINE HCL (CARDIAC) 20 MG/ML IV SOLN
INTRAVENOUS | Status: DC | PRN
  Administered 2015-12-14: 50 mg via INTRATRACHEAL

## 2015-12-14 MED ORDER — PROTAMINE SULFATE 10 MG/ML IV SOLN
INTRAVENOUS | Status: AC
  Filled 2015-12-14: qty 5

## 2015-12-14 MED ORDER — MIDAZOLAM HCL 2 MG/2ML IJ SOLN
INTRAMUSCULAR | Status: AC
  Filled 2015-12-14: qty 2

## 2015-12-14 MED ORDER — CEFAZOLIN SODIUM-DEXTROSE 2-3 GM-% IV SOLR
INTRAVENOUS | Status: DC | PRN
  Administered 2015-12-14: 2 g via INTRAVENOUS

## 2015-12-14 MED ORDER — SODIUM CHLORIDE 0.9 % IV SOLN
INTRAVENOUS | Status: DC | PRN
  Administered 2015-12-14: 500 mL

## 2015-12-14 MED ORDER — HEPARIN SODIUM (PORCINE) 1000 UNIT/ML IJ SOLN
INTRAMUSCULAR | Status: AC
  Filled 2015-12-14: qty 1

## 2015-12-14 MED ORDER — HYDRALAZINE HCL 20 MG/ML IJ SOLN
10.0000 mg | Freq: Once | INTRAMUSCULAR | Status: AC
  Administered 2015-12-14: 10 mg via INTRAVENOUS
  Filled 2015-12-14: qty 1

## 2015-12-14 MED ORDER — MIDAZOLAM HCL 5 MG/5ML IJ SOLN
INTRAMUSCULAR | Status: DC | PRN
  Administered 2015-12-14: 2 mg via INTRAVENOUS

## 2015-12-14 MED ORDER — THROMBIN 20000 UNITS EX SOLR
CUTANEOUS | Status: AC
  Filled 2015-12-14: qty 20000

## 2015-12-14 MED ORDER — PROMETHAZINE HCL 25 MG/ML IJ SOLN: 6.2500 mg | INTRAMUSCULAR | Status: DC | PRN

## 2015-12-14 MED ORDER — PROTAMINE SULFATE 10 MG/ML IV SOLN
INTRAVENOUS | Status: DC | PRN
  Administered 2015-12-14: 50 mg via INTRAVENOUS

## 2015-12-14 MED ORDER — OXYCODONE HCL 5 MG PO TABS: 5.0000 mg | ORAL_TABLET | Freq: Once | ORAL | Status: DC | PRN

## 2015-12-14 MED ORDER — SUFENTANIL CITRATE 50 MCG/ML IV SOLN
INTRAVENOUS | Status: AC
  Filled 2015-12-14: qty 1

## 2015-12-14 MED ORDER — PHENYLEPHRINE HCL 10 MG/ML IJ SOLN
10.0000 mg | INTRAVENOUS | Status: DC | PRN
  Administered 2015-12-14: 10 ug/min via INTRAVENOUS

## 2015-12-14 SURGICAL SUPPLY — 31 items
ADH SKN CLS APL DERMABOND .7 (GAUZE/BANDAGES/DRESSINGS) ×1
CANISTER SUCTION 2500CC (MISCELLANEOUS) ×2 IMPLANT
CANNULA VESSEL 3MM 2 BLNT TIP (CANNULA) ×1 IMPLANT
CLIP TI MEDIUM 6 (CLIP) ×1 IMPLANT
CLIP TI WIDE RED SMALL 6 (CLIP) ×1 IMPLANT
DERMABOND ADVANCED (GAUZE/BANDAGES/DRESSINGS) ×1
DERMABOND ADVANCED .7 DNX12 (GAUZE/BANDAGES/DRESSINGS) IMPLANT
ELECT REM PT RETURN 9FT ADLT (ELECTROSURGICAL) ×2
ELECTRODE REM PT RTRN 9FT ADLT (ELECTROSURGICAL) ×1 IMPLANT
GEL ULTRASOUND 20GR AQUASONIC (MISCELLANEOUS) ×2 IMPLANT
GLOVE BIO SURGEON STRL SZ7.5 (GLOVE) ×2 IMPLANT
GLOVE BIOGEL PI IND STRL 6.5 (GLOVE) IMPLANT
GLOVE BIOGEL PI INDICATOR 6.5 (GLOVE) ×3
GLOVE ECLIPSE 6.5 STRL STRAW (GLOVE) ×1 IMPLANT
GOWN STRL REUS W/ TWL LRG LVL3 (GOWN DISPOSABLE) ×3 IMPLANT
GOWN STRL REUS W/TWL LRG LVL3 (GOWN DISPOSABLE) ×8
KIT BASIN OR (CUSTOM PROCEDURE TRAY) ×2 IMPLANT
KIT ROOM TURNOVER OR (KITS) ×2 IMPLANT
LIQUID BAND (GAUZE/BANDAGES/DRESSINGS) ×2 IMPLANT
LOOP VESSEL MINI RED (MISCELLANEOUS) IMPLANT
NS IRRIG 1000ML POUR BTL (IV SOLUTION) ×2 IMPLANT
PACK CV ACCESS (CUSTOM PROCEDURE TRAY) ×2 IMPLANT
PAD ARMBOARD 7.5X6 YLW CONV (MISCELLANEOUS) ×4 IMPLANT
SPONGE SURGIFOAM ABS GEL 100 (HEMOSTASIS) IMPLANT
SUT PROLENE 6 0 CC (SUTURE) IMPLANT
SUT SILK 0 (SUTURE) IMPLANT
SUT VIC AB 3-0 SH 27 (SUTURE) ×2
SUT VIC AB 3-0 SH 27X BRD (SUTURE) ×1 IMPLANT
SUT VICRYL 4-0 PS2 18IN ABS (SUTURE) ×2 IMPLANT
UNDERPAD 30X30 INCONTINENT (UNDERPADS AND DIAPERS) ×2 IMPLANT
WATER STERILE IRR 1000ML POUR (IV SOLUTION) ×2 IMPLANT

## 2015-12-14 NOTE — Progress Notes (Signed)
D; pt is alert oriented, ready to go home, eating crackers and soda. Said his preacher will pick him up and his son will be home with him. Faxed to [facility] dialysis center .  Discharge instruction given to pt. Will give instruction to his family.

## 2015-12-14 NOTE — Op Note (Signed)
Procedure:Plication of left upper extremity AV fistula  Preoperative diagnosis: Aneurysmal degeneration left arm AV fistula   Postoperative diagnosis: Same  Anesthesia: Gen.  Operative findings: Plication of proximal third of left upper extremity AV fistula           Operative details: After obtaining informed consent, the patient was taken to the operating room. The patient was placed in supine position on the operating room table. After administration of general anesthesia, the entire left upper extremity was prepped and draped in usual sterile fashion. An eliptical incision was made over the proximal third of the fistula incorporating an aneurysmal degeneration with skin ulceration. The fistula was dissected free circumferentially throughout its course.  The patient was given 5000 units of intravenous heparin. The fistula was clamped proximally and distally above and below the aneurysms. The aneurysm was opened longitudinally and the redundant segment fistula excised. The area of resection was then reapproximated using a running 5-0 Prolene suture. The clamps were removed and flow restored to the fistula. Hemostasis was obtained with administration of 50 mg of protamine The subcutaneous tissues were reapproximated using a running 3-0 Vicryl suture. The skin was closed with a 4 0 Vicryl subcuticular stitch. Dermabond was applied to the skin incision.  The patient tolerated procedure well and there were no complications. Instrument sponge and needle counts were correct at the end of the case. The patient was taken to the recovery room in stable condition.   Ruta Hinds, MD [facility] Office: [phone] Pager: [phone]

## 2015-12-14 NOTE — Transfer of Care (Signed)
Immediate Anesthesia Transfer of Care Note  Patient: [name]  Procedure(s) Performed: Procedure(s): Plication of Left Arm Arteriovenous Fistula (Left)  Patient Location: PACU  Anesthesia Type:General  Level of Consciousness: awake  Airway & Oxygen Therapy: Patient Spontanous Breathing and Patient connected to face mask oxygen  Post-op Assessment: Report given to RN and Post -op Vital signs reviewed and stable  Post vital signs: Reviewed and stable  Last Vitals:  Filed Vitals:   12/14/15 0200 12/14/15 0230  BP: 205/79 185/90  Pulse: 68 67  Temp:    Resp:      Last Pain:  Filed Vitals:   12/14/15 0246  PainSc: 0-No pain         Complications: No apparent anesthesia complications

## 2015-12-14 NOTE — Anesthesia Procedure Notes (Signed)
Procedure Name: Intubation Date/Time: 12/14/2015 4:46 AM Performed by: Maude Leriche D Pre-anesthesia Checklist: Patient identified, Emergency Drugs available, Suction available, Patient being monitored and Timeout performed Patient Re-evaluated:Patient Re-evaluated prior to inductionOxygen Delivery Method: Circle system utilized Preoxygenation: Pre-oxygenation with 100% oxygen Intubation Type: IV induction, Rapid sequence and Cricoid Pressure applied Laryngoscope Size: Miller and 2 Grade View: Grade I Tube type: Oral Tube size: 8.0 mm Number of attempts: 1 Airway Equipment and Method: Stylet Placement Confirmation: ETT inserted through vocal cords under direct vision,  positive ETCO2 and breath sounds checked- equal and bilateral Secured at: 22 cm Tube secured with: Tape Dental Injury: Teeth and Oropharynx as per pre-operative assessment

## 2015-12-14 NOTE — Anesthesia Postprocedure Evaluation (Signed)
Anesthesia Post Note  Patient: [name]  Procedure(s) Performed: Procedure(s) (LRB): Plication of Left Arm Arteriovenous Fistula (Left)  Patient location during evaluation: PACU Anesthesia Type: General Level of consciousness: awake and alert Pain management: pain level controlled Vital Signs Assessment: post-procedure vital signs reviewed and stable Respiratory status: spontaneous breathing, nonlabored ventilation, respiratory function stable and patient connected to nasal cannula oxygen Cardiovascular status: blood pressure returned to baseline and stable Postop Assessment: no signs of nausea or vomiting Anesthetic complications: no    Last Vitals:  Filed Vitals:   12/14/15 0230 12/14/15 0607  BP: 185/90 132/69  Pulse: 67 72  Temp:  36.4 C  Resp:  22    Last Pain:  Filed Vitals:   12/14/15 0620  PainSc: 0-No pain                 Buddie Marston S

## 2015-12-14 NOTE — Anesthesia Preprocedure Evaluation (Addendum)
Anesthesia Evaluation  Patient identified by MRN, date of birth, ID band Patient awake    Reviewed: Allergy & Precautions, NPO status , Patient's Chart, lab work & pertinent test results  Airway Mallampati: II  TM Distance: >3 FB Neck ROM: Full    Dental no notable dental hx. (+) Teeth Intact, Dental Advisory Given   Pulmonary Current Smoker,    Pulmonary exam normal breath sounds clear to auscultation       Cardiovascular hypertension, Pt. on medications + Past MI  Normal cardiovascular exam Rhythm:Regular Rate:Normal     Neuro/Psych negative neurological ROS  negative psych ROS   GI/Hepatic negative GI ROS, (+)     substance abuse  alcohol use, cocaine use and marijuana use, Hepatitis -, B, C  Endo/Other  negative endocrine ROS  Renal/GU DialysisRenal disease  negative genitourinary   Musculoskeletal negative musculoskeletal ROS (+)   Abdominal   Peds negative pediatric ROS (+)  Hematology negative hematology ROS (+)   Anesthesia Other Findings   Reproductive/Obstetrics negative OB ROS                          Anesthesia Physical Anesthesia Plan  ASA: III and emergent  Anesthesia Plan: General   Post-op Pain Management:    Induction: Intravenous and Rapid sequence  Airway Management Planned: Oral ETT  Additional Equipment:   Intra-op Plan:   Post-operative Plan: Extubation in OR  Informed Consent: I have reviewed the patients History and Physical, chart, labs and discussed the procedure including the risks, benefits and alternatives for the proposed anesthesia with the patient or authorized representative who has indicated his/her understanding and acceptance.   Dental advisory given  Plan Discussed with: CRNA, Surgeon and Anesthesiologist  Anesthesia Plan Comments: (Patient given graham crackers in ED at 0300)      Anesthesia Quick Evaluation

## 2015-12-14 NOTE — H&P (Signed)
Referring Physician: No ref. provider found  Patient name: [name] MRN: [id number] DOB: [birth year] Sex: male  REASON FOR CONSULT: bleeding AV fistula  HPI: [name] is a 55 y.o. male, left arm AV fistula bleeding.  This started a few hours ago and could not be controlled in the ER.  Pt has been hypertensive.  Other medical problems include hep C/hep B hypertension.  HD MWF.  Was offered revision of fistula by Kellie Simmering recently but refused.  Past Medical History  Diagnosis Date  . Chronic kidney disease   . Hypertension   . Hepatitis B, chronic ([place])   . Hepatitis C   . Anemia   . Metabolic bone disease   . Renal disorder   . Chest pain     DATE UNKNOWN, C/O PERIODICALLY  . GERD (gastroesophageal reflux disease)     DATE UNKNOWN  . Headache(784.0)     OCCASIONALLY  . Dialysis patient ([place])     Monday-Wednesday-Friday  . Hyperkalemia   . Pulmonary edema   . Hemorrhoids   . Adenomatous colon polyp     tubular  . Myocardial infarction ([place])   . Renal insufficiency    Past Surgical History  Procedure Laterality Date  . Av fistula placement  2012    BELIEVED WAS PLACED IN [place]  . Hemorrhoid banding      Family History  Problem Relation Age of Onset  . Heart disease Mother   . Lung cancer Mother   . Heart disease Father   . Malignant hyperthermia Father   . Hypertension Other   . COPD Other   . Colon cancer Neg Hx   . Throat cancer Sister     SOCIAL HISTORY: Social History   Social History  . Marital Status: Single    Spouse Name: N/A  . Number of Children: 3  . Years of Education: 10   Occupational History  . Unemployed    Social History Main Topics  . Smoking status: Current Every Day Smoker -- 0.75 packs/day for 37 years    Types: Cigarettes  . Smokeless tobacco: Never Used  . Alcohol Use: No     Comment: 1 beer/2 weeks.   . Drug Use: Yes    Special: Marijuana, Cocaine     Comment: once month smoke marijuana  . Sexual Activity:  Not on file     Comment: "NOT LATELY" on cocaine   Other Topics Concern  . Not on file   Social History Narrative   Lives alone   Caffeine use: Coffee-rare   Soda- daily       Allergies  Allergen Reactions  . Aspirin   . Clonidine Derivatives Other (See Comments)    Clonidine patch causes stomach pain  . Tramadol Itching    No current facility-administered medications for this encounter.   Current Outpatient Prescriptions  Medication Sig Dispense Refill  . amLODipine (NORVASC) 10 MG tablet Take 10 mg by mouth at bedtime.     . carvedilol (COREG) 25 MG tablet Take 25 mg by mouth 2 (two) times daily with a meal.    . cinacalcet (SENSIPAR) 30 MG tablet Take 30 mg by mouth daily.    . DULERA 100-5 MCG/ACT AERO Inhale 2 puffs into the lungs 2 (two) times daily.     Marland Kitchen lisinopril (PRINIVIL,ZESTRIL) 20 MG tablet Take 40 mg by mouth daily.     . minoxidil (LONITEN) 10 MG tablet Take 5 mg by mouth 2 (two)  times daily.     Marland Kitchen RENVELA 800 MG tablet Take 1,600-3,200 mg by mouth 3 (three) times daily with meals. Take 4 tablets (3200 mg) with each meal and 2 (1600 mg) tablets with snacks.      ROS:   General:  No weight loss, Fever, chills  HEENT: No recent headaches, no nasal bleeding, no visual changes, no sore throat  Neurologic: No dizziness, blackouts, seizures. No recent symptoms of stroke or mini- stroke. No recent episodes of slurred speech, or temporary blindness.  Cardiac: No recent episodes of chest pain/pressure, no shortness of breath at rest.  No shortness of breath with exertion.  Denies history of atrial fibrillation or irregular heartbeat  Vascular: No history of rest pain in feet.  No history of claudication.  No history of non-healing ulcer, No history of DVT   Pulmonary: No home oxygen, no productive cough, no hemoptysis,  No asthma or wheezing  Musculoskeletal:  [ ]  Arthritis, [ ]  Low back pain,  [ ]  Joint pain  Hematologic:No history of hypercoagulable state.   No history of easy bleeding.  No history of anemia  Gastrointestinal: No hematochezia or melena,  No gastroesophageal reflux, no trouble swallowing  Urinary: [ ]  chronic Kidney disease, [ x] on HD - [x ] MWF or [ ]  TTHS, [ ]  Burning with urination, [ ]  Frequent urination, [ ]  Difficulty urinating;   Skin: No rashes  Psychological: No history of anxiety,  No history of depression   Physical Examination  Filed Vitals:   12/13/15 2324 12/14/15 0030 12/14/15 0130  BP: 230/95 197/91 208/81  Pulse: 89 74 71  Temp: 97.5 F (36.4 C)    TempSrc: Oral    Resp: 22  20  SpO2: 96% 96% 98%    There is no weight on file to calculate BMI.  General:  Alert and oriented, no acute distress HEENT: Normal Neck: No bruit or JVD Pulmonary: Clear to auscultation bilaterally Cardiac: Regular Rate and Rhythm without murmur Abdomen: Soft, non-tender, non-distended, no mass, no scars Skin: No rash Extremity Pulses:  Left upper extremity brachiocephalic fistula with pressure dressing in place, dry eschar with bleeding adjacent to this. Musculoskeletal: No deformity or edema  Neurologic: Upper and lower extremity motor 5/5 and symmetric    ASSESSMENT:  Bleeding left arm AV fistula   PLAN:  Revision vs ligation +/- tunneled catheter.  Procedure risk benefits discussed   Ruta Hinds, MD [facility] Office: [phone] Pager: [phone]

## 2015-12-14 NOTE — ED Notes (Signed)
Pressure dressing with quick clot and combat guaze applied to L arm. Pt still bleeding actively at time of bandage change. Primary RN notified.

## 2015-12-15 ENCOUNTER — Encounter (HOSPITAL_COMMUNITY): Payer: Self-pay | Admitting: Vascular Surgery

## 2015-12-15 DIAGNOSIS — M25519 Pain in unspecified shoulder: Secondary | ICD-10-CM | POA: Diagnosis not present

## 2015-12-15 DIAGNOSIS — F17200 Nicotine dependence, unspecified, uncomplicated: Secondary | ICD-10-CM | POA: Diagnosis not present

## 2015-12-15 DIAGNOSIS — N186 End stage renal disease: Secondary | ICD-10-CM | POA: Diagnosis not present

## 2015-12-15 DIAGNOSIS — I10 Essential (primary) hypertension: Secondary | ICD-10-CM | POA: Diagnosis not present

## 2015-12-15 DIAGNOSIS — J449 Chronic obstructive pulmonary disease, unspecified: Secondary | ICD-10-CM | POA: Diagnosis not present

## 2015-12-24 ENCOUNTER — Ambulatory Visit: Payer: Medicare Other | Admitting: Vascular Surgery

## 2015-12-24 ENCOUNTER — Encounter: Payer: Self-pay | Admitting: Vascular Surgery

## 2015-12-26 ENCOUNTER — Ambulatory Visit: Payer: Medicare Other | Admitting: Vascular Surgery

## 2016-01-05 ENCOUNTER — Telehealth: Payer: Self-pay

## 2016-01-05 NOTE — Telephone Encounter (Signed)
Phone call from nurse at the [facility].  Reported pt. Has a penny-size open sore on the left arm AVF; pt. Reported there was a scab present, and he picked it off.  Requested an ASAP appt. To evaluate, based on recent episode of bleeding at AVF site.  Appt. Offered today at 1:15 or 2:15 PM.  Nurse reported that the pt. Stated he is not able to come to office today.  Reported she will call the office back re: an appt. For pt. Tomorrow.  Reported she is going to strongly encourage him to come ASAP.

## 2016-01-07 ENCOUNTER — Encounter: Payer: Self-pay | Admitting: Family

## 2016-01-08 ENCOUNTER — Encounter: Payer: Self-pay | Admitting: Family

## 2016-01-08 ENCOUNTER — Ambulatory Visit (INDEPENDENT_AMBULATORY_CARE_PROVIDER_SITE_OTHER): Payer: Self-pay | Admitting: Family

## 2016-01-08 ENCOUNTER — Other Ambulatory Visit: Payer: Self-pay

## 2016-01-08 VITALS — BP 176/78 | HR 75 | Temp 96.3°F | Ht 69.0 in | Wt 152.7 lb

## 2016-01-08 DIAGNOSIS — Z992 Dependence on renal dialysis: Secondary | ICD-10-CM

## 2016-01-08 DIAGNOSIS — T82590A Other mechanical complication of surgically created arteriovenous fistula, initial encounter: Secondary | ICD-10-CM

## 2016-01-08 DIAGNOSIS — N186 End stage renal disease: Secondary | ICD-10-CM

## 2016-01-08 DIAGNOSIS — F17200 Nicotine dependence, unspecified, uncomplicated: Secondary | ICD-10-CM

## 2016-01-08 DIAGNOSIS — Z720 Tobacco use: Secondary | ICD-10-CM

## 2016-01-08 NOTE — Patient Instructions (Signed)
Steps to Quit Smoking  Smoking tobacco can be harmful to your health and can affect almost every organ in your body. Smoking puts you, and those around you, at risk for developing many serious chronic diseases. Quitting smoking is difficult, but it is one of the best things that you can do for your health. It is never too late to quit. WHAT ARE THE BENEFITS OF QUITTING SMOKING? When you quit smoking, you lower your risk of developing serious diseases and conditions, such as:  Lung cancer or lung disease, such as COPD.  Heart disease.  Stroke.  Heart attack.  Infertility.  Osteoporosis and bone fractures. Additionally, symptoms such as coughing, wheezing, and shortness of breath may get better when you quit. You may also find that you get sick less often because your body is stronger at fighting off colds and infections. If you are pregnant, quitting smoking can help to reduce your chances of having a baby of low birth weight. HOW DO I GET READY TO QUIT? When you decide to quit smoking, create a plan to make sure that you are successful. Before you quit:  Pick a date to quit. Set a date within the next two weeks to give you time to prepare.  Write down the reasons why you are quitting. Keep this list in places where you will see it often, such as on your bathroom mirror or in your car or wallet.  Identify the people, places, things, and activities that make you want to smoke (triggers) and avoid them. Make sure to take these actions:  Throw away all cigarettes at home, at work, and in your car.  Throw away smoking accessories, such as ashtrays and lighters.  Clean your car and make sure to empty the ashtray.  Clean your home, including curtains and carpets.  Tell your family, friends, and coworkers that you are quitting. Support from your loved ones can make quitting easier.  Talk with your health care provider about your options for quitting smoking.  Find out what treatment  options are covered by your health insurance. WHAT STRATEGIES CAN I USE TO QUIT SMOKING?  Talk with your healthcare provider about different strategies to quit smoking. Some strategies include:  Quitting smoking altogether instead of gradually lessening how much you smoke over a period of time. Research shows that quitting "cold turkey" is more successful than gradually quitting.  Attending in-person counseling to help you build problem-solving skills. You are more likely to have success in quitting if you attend several counseling sessions. Even short sessions of 10 minutes can be effective.  Finding resources and support systems that can help you to quit smoking and remain smoke-free after you quit. These resources are most helpful when you use them often. They can include:  Online chats with a counselor.  Telephone quitlines.  Printed self-help materials.  Support groups or group counseling.  Text messaging programs.  Mobile phone applications.  Taking medicines to help you quit smoking. (If you are pregnant or breastfeeding, talk with your health care provider first.) Some medicines contain nicotine and some do not. Both types of medicines help with cravings, but the medicines that include nicotine help to relieve withdrawal symptoms. Your health care provider may recommend:  Nicotine patches, gum, or lozenges.  Nicotine inhalers or sprays.  Non-nicotine medicine that is taken by mouth. Talk with your health care provider about combining strategies, such as taking medicines while you are also receiving in-person counseling. Using these two strategies together makes   you more likely to succeed in quitting than if you used either strategy on its own. If you are pregnant or breastfeeding, talk with your health care provider about finding counseling or other support strategies to quit smoking. Do not take medicine to help you quit smoking unless told to do so by your health care  provider. WHAT THINGS CAN I DO TO MAKE IT EASIER TO QUIT? Quitting smoking might feel overwhelming at first, but there is a lot that you can do to make it easier. Take these important actions:  Reach out to your family and friends and ask that they support and encourage you during this time. Call telephone quitlines, reach out to support groups, or work with a counselor for support.  Ask people who smoke to avoid smoking around you.  Avoid places that trigger you to smoke, such as bars, parties, or smoke-break areas at work.  Spend time around people who do not smoke.  Lessen stress in your life, because stress can be a smoking trigger for some people. To lessen stress, try:  Exercising regularly.  Deep-breathing exercises.  Yoga.  Meditating.  Performing a body scan. This involves closing your eyes, scanning your body from head to toe, and noticing which parts of your body are particularly tense. Purposefully relax the muscles in those areas.  Download or purchase mobile phone or tablet apps (applications) that can help you stick to your quit plan by providing reminders, tips, and encouragement. There are many free apps, such as QuitGuide from the CDC (Centers for Disease Control and Prevention). You can find other support for quitting smoking (smoking cessation) through smokefree.gov and other websites. HOW WILL I FEEL WHEN I QUIT SMOKING? Within the first 24 hours of quitting smoking, you may start to feel some withdrawal symptoms. These symptoms are usually most noticeable 2-3 days after quitting, but they usually do not last beyond 2-3 weeks. Changes or symptoms that you might experience include:  Mood swings.  Restlessness, anxiety, or irritation.  Difficulty concentrating.  Dizziness.  Strong cravings for sugary foods in addition to nicotine.  Mild weight gain.  Constipation.  Nausea.  Coughing or a sore throat.  Changes in how your medicines work in your  body.  A depressed mood.  Difficulty sleeping (insomnia). After the first 2-3 weeks of quitting, you may start to notice more positive results, such as:  Improved sense of smell and taste.  Decreased coughing and sore throat.  Slower heart rate.  Lower blood pressure.  Clearer skin.  The ability to breathe more easily.  Fewer sick days. Quitting smoking is very challenging for most people. Do not get discouraged if you are not successful the first time. Some people need to make many attempts to quit before they achieve long-term success. Do your best to stick to your quit plan, and talk with your health care provider if you have any questions or concerns.   This information is not intended to replace advice given to you by your health care provider. Make sure you discuss any questions you have with your health care provider.   Document Released: 06/22/2001 Document Revised: 11/12/2014 Document Reviewed: 11/12/2014 Elsevier Interactive Patient Education 2016 Elsevier Inc.  

## 2016-01-08 NOTE — Progress Notes (Signed)
Established Dialysis Access  History of Present Illness  [name] is a 55 y.o. (Oct 17, 1960) male patient that Dr. Kellie Simmering last saw in May of 2017, was referred by Dr. Otelia Santee for evaluation of a scab on the left brachial-cephalic AV fistula. Patient's fistula is functioning nicely. Patient has been having pain in the arm with occasional coldness down the forearm area and the hand and also pain up into the shoulder and occasionally into the left chest with shortness of breath. He states that this is due to the fistula "pushing on a nerve". He was told this by an orthopedic surgeon who evaluated him. Dr. Augustin Coupe referred him to evaluate removal of the scab and revision of the fistula. I discussed this at length with the patient but he refused to agree to have the scab removed stating that he wants the fistula "moved" or tied off.  Patient would not agree to revision of the fistula with removal of the eschar. He feels that the fistula is the problem causing his pain because it "pushing on a nerve". At pt's May 2017 visit Dr. Kellie Simmering discussed this with Dr. Augustin Coupe and the fact that the patient and Dr. Kellie Simmering could not come to agreement on what to do. Dr. Kellie Simmering did not recommend ligation of the fistula as he did not think this will remove any symptoms he is experiencing. Patient was to meet with Dr. Augustin Coupe and come up with a plan before we proceed with any further surgery. If we get rid of the fistula it would require ligation of the fistula with insertion of temporary catheter and later creation of fistula in the contralateral right arm.  On 0000000 he had a plication of proximal third of left upper extremity AVF after arriving through the ED with bleeding from a portion of his left arm AVF.  Pt returns today after a phone call on 01/05/16 from nurse at the [facility]. Reported pt has a penny-size open sore on the left arm AVF; pt reported there was a scab present, and he picked it  off. Requested an ASAP appt to evaluate, based on recent episode of bleeding at AVF site. Appt. Offered for 01/05/16 at 1:15 or 2:15 PM. Nurse reported that the pt. Stated he is not able to come to office that day. Pt does not know how long the scab has been on the distal aspect of an aneurysmal segment of the left upper arm AVF. He states that he is afraid that this will bleed as did the a more distal segment of the AVF that was plicated on [id number] after he came through the ED. He dialyzes MWF. He denies steal sx's now in his left forearm or hand, states he does have some steal sx's during HD that seem tolerable.  He is right hand dominant.   He smokes 3/4 ppd x 37 years.   Past Medical History  Diagnosis Date  . Chronic kidney disease   . Hypertension   . Hepatitis B, chronic ([place])   . Hepatitis C   . Anemia   . Metabolic bone disease   . Renal disorder   . Chest pain     DATE UNKNOWN, C/O PERIODICALLY  . GERD (gastroesophageal reflux disease)     DATE UNKNOWN  . Headache(784.0)     OCCASIONALLY  . Dialysis patient ([place])     Monday-Wednesday-Friday  . Hyperkalemia   . Pulmonary edema   . Hemorrhoids   . Adenomatous colon  polyp     tubular  . Myocardial infarction ([place])   . Renal insufficiency     Social History Social History  Substance Use Topics  . Smoking status: Current Every Day Smoker -- 0.75 packs/day for 37 years    Types: Cigarettes  . Smokeless tobacco: Never Used  . Alcohol Use: No     Comment: 1 beer/2 weeks.     Family History Family History  Problem Relation Age of Onset  . Heart disease Mother   . Lung cancer Mother   . Heart disease Father   . Malignant hyperthermia Father   . Hypertension Other   . COPD Other   . Colon cancer Neg Hx   . Throat cancer Sister     Surgical History Past Surgical History  Procedure Laterality Date  . Av fistula placement  2012    BELIEVED WAS PLACED IN [place]  . Hemorrhoid banding    . Ligation of  arteriovenous  fistula Left [id number]    Procedure: Plication of Left Arm Arteriovenous Fistula;  Surgeon: Elam Dutch, MD;  Location: [facility];  Service: Vascular;  Laterality: Left;    Allergies  Allergen Reactions  . Aspirin   . Clonidine Derivatives Other (See Comments)    Clonidine patch causes stomach pain  . Tramadol Itching    Current Outpatient Prescriptions  Medication Sig Dispense Refill  . amLODipine (NORVASC) 10 MG tablet Take 10 mg by mouth at bedtime.     . carvedilol (COREG) 25 MG tablet Take 25 mg by mouth 2 (two) times daily with a meal.    . cinacalcet (SENSIPAR) 30 MG tablet Take 30 mg by mouth daily.    . DULERA 100-5 MCG/ACT AERO Inhale 2 puffs into the lungs 2 (two) times daily.     Marland Kitchen lisinopril (PRINIVIL,ZESTRIL) 20 MG tablet Take 40 mg by mouth daily.     . minoxidil (LONITEN) 10 MG tablet Take 5 mg by mouth 2 (two) times daily.     Marland Kitchen oxyCODONE-acetaminophen (PERCOCET/ROXICET) 5-325 MG tablet Take 2 tablets by mouth every 6 (six) hours as needed for severe pain. 10 tablet 0  . RENVELA 800 MG tablet Take 1,600-3,200 mg by mouth 3 (three) times daily with meals. Take 4 tablets (3200 mg) with each meal and 2 (1600 mg) tablets with snacks.     No current facility-administered medications for this visit.     REVIEW OF SYSTEMS: see HPI for pertinent positives and negatives    PHYSICAL EXAMINATION:  Filed Vitals:   01/08/16 1003 01/08/16 1005  BP: 173/75 176/78  Pulse: 75   Temp: 96.3 F (35.7 C)   TempSrc: Oral   Height: 5\' 9"  (1.753 m)   Weight: 152 lb 11.2 oz (69.264 kg)   SpO2: 98%    Body mass index is 22.54 kg/(m^2).  General: The patient appears their stated age.   HEENT:  No gross abnormalities Pulmonary: Respirations are non-labored Abdomen: Soft and non-tender with normal pitched bowel sounds. Musculoskeletal: There are no major deformities.   Neurologic: No focal weakness or paresthesias are detected Skin: There are no rashes  noted. Psychiatric: The patient has normal affect. Cardiovascular: There is a regular rate and rhythm without significant murmur appreciated.  Vascular: Left upper extremity with brachial-cephalic AV fistula which is dilated and very tortuous except where recently plicated at the distal portion. There is a thinned area over the distal aneurysmal section. This area measures about a centimeter in diameter. The most distal aspect  of the AVF aneurysm was plicated on 0000000 and is well healed.There is no purulence, drainage, or evidence of any bleeding. There is 2+ radial pulse palpable distally. The left hand is well perfused.     Medical Decision Making  RHYLEY ZILCH is a 55 y.o. male who presents with a thinned area over the most distal aneurysmal dilation of his left arm AVF. The most recent plicated area of the AVF is healing well; this area should not be used for HD access for several more weeks to allow for more complete healing. The most proximal aspect of his AVF should be accessed for HD. Dr. Oneida Alar spoke with and examined pt. Will schedule him for plication of distal aneurysmal segment of left upper arm AVF, there is a thinning area on this aneurysmal segment, on 01/20/16 with Dr. Oneida Alar.  The patient was counseled re smoking cessation and given several free resources re smoking cessation.    NICKEL, Sharmon Leyden, RN, MSN, FNP-C [facility] [place] Office: [phone]  01/08/2016, 10:22 AM  Clinic MD: Oneida Alar

## 2016-01-09 DIAGNOSIS — N186 End stage renal disease: Secondary | ICD-10-CM | POA: Diagnosis not present

## 2016-01-09 DIAGNOSIS — Z992 Dependence on renal dialysis: Secondary | ICD-10-CM | POA: Diagnosis not present

## 2016-01-09 DIAGNOSIS — I120 Hypertensive chronic kidney disease with stage 5 chronic kidney disease or end stage renal disease: Secondary | ICD-10-CM | POA: Diagnosis not present

## 2016-01-12 DIAGNOSIS — D631 Anemia in chronic kidney disease: Secondary | ICD-10-CM | POA: Diagnosis not present

## 2016-01-12 DIAGNOSIS — N2581 Secondary hyperparathyroidism of renal origin: Secondary | ICD-10-CM | POA: Diagnosis not present

## 2016-01-12 DIAGNOSIS — D509 Iron deficiency anemia, unspecified: Secondary | ICD-10-CM | POA: Diagnosis not present

## 2016-01-12 DIAGNOSIS — N186 End stage renal disease: Secondary | ICD-10-CM | POA: Diagnosis not present

## 2016-01-19 ENCOUNTER — Encounter (HOSPITAL_COMMUNITY): Payer: Self-pay | Admitting: *Deleted

## 2016-01-19 ENCOUNTER — Telehealth: Payer: Self-pay

## 2016-01-19 MED ORDER — CEFUROXIME SODIUM 1.5 G IJ SOLR
1.5000 g | INTRAMUSCULAR | Status: AC
  Administered 2016-01-20: 1.5 g via INTRAVENOUS
  Filled 2016-01-19: qty 1.5

## 2016-01-19 MED ORDER — CHLORHEXIDINE GLUCONATE CLOTH 2 % EX PADS: 6.0000 | MEDICATED_PAD | Freq: Once | CUTANEOUS | Status: DC

## 2016-01-19 MED ORDER — SODIUM CHLORIDE 0.9 % IV SOLN
INTRAVENOUS | Status: DC
  Administered 2016-01-20: 14:00:00 via INTRAVENOUS
  Administered 2016-01-20: 35 mL/h via INTRAVENOUS

## 2016-01-19 NOTE — Progress Notes (Signed)
Chart sent to Levada Dy, NP for review d/t recent use of illicit substances.

## 2016-01-19 NOTE — Progress Notes (Signed)
Anesthesia Chart Review:  Pt is a 55 year old male scheduled for plication of distal aneurysmal segment of L upper arm AV fistula on 01/20/2016 with Ruta Hinds, MD.   Pt is a same day work up.   PMH includes:  HTN, ESRD on dialysis, hepatitis C, hepatitis B, anemia, GERD. Current smoker. Current cocaine and marijuana use (last reported use 01/18/16). BMI 22.5. S/p plication L AV fistula 12/14/15.   Medications include: amlodipine, carvedilol, dulera, lisinopril, minoxidil  Labs will be obtained DOS. Dr. Oneida Alar has ordered an I-stat 4, but as labs from 12/13/15 show platelet count was 83, I have also ordered a CBC.   EKG will be obtained DOS.   Notified Stephanie in Dr. Oneida Alar' office of current cocaine use.   If labs and EKG acceptable DOS, and pt's VSS, I anticipate pt can proceed as scheduled.   Willeen Cass, FNP-BC [facility]/Anesthesiology Phone: [phone] 01/19/2016 12:09 PM

## 2016-01-19 NOTE — Telephone Encounter (Signed)
Attempted to return call to pt., after receiving voice message with request for pain medication.  Unable to reach pt. per phone, and no voice message was set up to be able to leave a message.

## 2016-01-20 ENCOUNTER — Telehealth: Payer: Self-pay | Admitting: Vascular Surgery

## 2016-01-20 ENCOUNTER — Encounter (HOSPITAL_COMMUNITY)
Admission: RE | Disposition: A | Discharge status: Home / Self Care | Payer: Self-pay | Source: Ambulatory Visit | Attending: Vascular Surgery

## 2016-01-20 ENCOUNTER — Encounter (HOSPITAL_COMMUNITY): Payer: Self-pay | Admitting: *Deleted

## 2016-01-20 ENCOUNTER — Ambulatory Visit (HOSPITAL_COMMUNITY): Payer: Medicare Other | Admitting: Emergency Medicine

## 2016-01-20 ENCOUNTER — Ambulatory Visit (HOSPITAL_COMMUNITY)
Admission: RE | Admit: 2016-01-20 | Discharge: 2016-01-20 | Disposition: A | Discharge status: Home / Self Care | Payer: Medicare Other | Source: Ambulatory Visit | Attending: Vascular Surgery | Admitting: Vascular Surgery

## 2016-01-20 DIAGNOSIS — I252 Old myocardial infarction: Secondary | ICD-10-CM | POA: Diagnosis not present

## 2016-01-20 DIAGNOSIS — T82898A Other specified complication of vascular prosthetic devices, implants and grafts, initial encounter: Secondary | ICD-10-CM | POA: Insufficient documentation

## 2016-01-20 DIAGNOSIS — Z79899 Other long term (current) drug therapy: Secondary | ICD-10-CM | POA: Insufficient documentation

## 2016-01-20 DIAGNOSIS — N186 End stage renal disease: Secondary | ICD-10-CM | POA: Insufficient documentation

## 2016-01-20 DIAGNOSIS — Z7951 Long term (current) use of inhaled steroids: Secondary | ICD-10-CM | POA: Insufficient documentation

## 2016-01-20 DIAGNOSIS — Y832 Surgical operation with anastomosis, bypass or graft as the cause of abnormal reaction of the patient, or of later complication, without mention of misadventure at the time of the procedure: Secondary | ICD-10-CM | POA: Insufficient documentation

## 2016-01-20 DIAGNOSIS — I729 Aneurysm of unspecified site: Secondary | ICD-10-CM | POA: Diagnosis not present

## 2016-01-20 DIAGNOSIS — D649 Anemia, unspecified: Secondary | ICD-10-CM | POA: Diagnosis not present

## 2016-01-20 DIAGNOSIS — F17210 Nicotine dependence, cigarettes, uncomplicated: Secondary | ICD-10-CM | POA: Diagnosis not present

## 2016-01-20 DIAGNOSIS — I120 Hypertensive chronic kidney disease with stage 5 chronic kidney disease or end stage renal disease: Secondary | ICD-10-CM | POA: Insufficient documentation

## 2016-01-20 DIAGNOSIS — K219 Gastro-esophageal reflux disease without esophagitis: Secondary | ICD-10-CM | POA: Diagnosis not present

## 2016-01-20 DIAGNOSIS — Z992 Dependence on renal dialysis: Secondary | ICD-10-CM | POA: Insufficient documentation

## 2016-01-20 HISTORY — PX: REVISON OF ARTERIOVENOUS FISTULA: SHX6074

## 2016-01-20 LAB — CBC
HCT: 28.5 % — ABNORMAL LOW (ref 39.0–52.0)
Hemoglobin: 9.8 g/dL — ABNORMAL LOW (ref 13.0–17.0)
MCH: 28.2 pg (ref 26.0–34.0)
MCHC: 34.4 g/dL (ref 30.0–36.0)
MCV: 82.1 fL (ref 78.0–100.0)
Platelets: 75 10*3/uL — ABNORMAL LOW (ref 150–400)
RBC: 3.47 MIL/uL — ABNORMAL LOW (ref 4.22–5.81)
RDW: 17.7 % — ABNORMAL HIGH (ref 11.5–15.5)
WBC: 5.3 10*3/uL (ref 4.0–10.5)

## 2016-01-20 LAB — POCT I-STAT 4, (NA,K, GLUC, HGB,HCT)
Glucose, Bld: 94 mg/dL (ref 65–99)
HCT: 32 % — ABNORMAL LOW (ref 39.0–52.0)
Hemoglobin: 10.9 g/dL — ABNORMAL LOW (ref 13.0–17.0)
Potassium: 4 mmol/L (ref 3.5–5.1)
Sodium: 133 mmol/L — ABNORMAL LOW (ref 135–145)

## 2016-01-20 SURGERY — REVISON OF ARTERIOVENOUS FISTULA
Anesthesia: General | Site: Arm Upper | Laterality: Left

## 2016-01-20 MED ORDER — PHENYLEPHRINE HCL 10 MG/ML IJ SOLN
10.0000 mg | INTRAVENOUS | Status: DC | PRN
  Administered 2016-01-20: 25 ug/min via INTRAVENOUS

## 2016-01-20 MED ORDER — HEPARIN SODIUM (PORCINE) 1000 UNIT/ML IJ SOLN
INTRAMUSCULAR | Status: DC | PRN
  Administered 2016-01-20: 5000 [IU] via INTRAVENOUS

## 2016-01-20 MED ORDER — FENTANYL CITRATE (PF) 250 MCG/5ML IJ SOLN
INTRAMUSCULAR | Status: AC
Start: 2016-01-20 — End: 2016-01-20
  Filled 2016-01-20: qty 5

## 2016-01-20 MED ORDER — ONDANSETRON HCL 4 MG/2ML IJ SOLN
INTRAMUSCULAR | Status: AC
  Filled 2016-01-20: qty 2

## 2016-01-20 MED ORDER — MIDAZOLAM HCL 5 MG/5ML IJ SOLN
INTRAMUSCULAR | Status: DC | PRN
  Administered 2016-01-20: 2 mg via INTRAVENOUS

## 2016-01-20 MED ORDER — NEOSTIGMINE METHYLSULFATE 10 MG/10ML IV SOLN
INTRAVENOUS | Status: DC | PRN
  Administered 2016-01-20: 3 mg via INTRAVENOUS

## 2016-01-20 MED ORDER — LIDOCAINE HCL (CARDIAC) 20 MG/ML IV SOLN
INTRAVENOUS | Status: DC | PRN
  Administered 2016-01-20: 100 mg via INTRAVENOUS

## 2016-01-20 MED ORDER — FENTANYL CITRATE (PF) 100 MCG/2ML IJ SOLN
25.0000 ug | INTRAMUSCULAR | Status: DC | PRN
  Administered 2016-01-20: 50 ug via INTRAVENOUS

## 2016-01-20 MED ORDER — DEXAMETHASONE SODIUM PHOSPHATE 10 MG/ML IJ SOLN
INTRAMUSCULAR | Status: AC
  Filled 2016-01-20: qty 1

## 2016-01-20 MED ORDER — PROPOFOL 10 MG/ML IV BOLUS
INTRAVENOUS | Status: DC | PRN
  Administered 2016-01-20: 170 mg via INTRAVENOUS
  Administered 2016-01-20: 30 mg via INTRAVENOUS

## 2016-01-20 MED ORDER — METOCLOPRAMIDE HCL 5 MG/ML IJ SOLN: 10.0000 mg | Freq: Once | INTRAMUSCULAR | Status: DC | PRN

## 2016-01-20 MED ORDER — PROTAMINE SULFATE 10 MG/ML IV SOLN
INTRAVENOUS | Status: AC
  Filled 2016-01-20: qty 5

## 2016-01-20 MED ORDER — PHENYLEPHRINE HCL 10 MG/ML IJ SOLN
INTRAMUSCULAR | Status: DC | PRN
  Administered 2016-01-20 (×2): 80 ug via INTRAVENOUS
  Administered 2016-01-20: 40 ug via INTRAVENOUS

## 2016-01-20 MED ORDER — PROPOFOL 10 MG/ML IV BOLUS
INTRAVENOUS | Status: AC
  Filled 2016-01-20: qty 20

## 2016-01-20 MED ORDER — OXYCODONE-ACETAMINOPHEN 5-325 MG PO TABS: 1.0000 | ORAL_TABLET | Freq: Four times a day (QID) | ORAL | Status: DC | PRN

## 2016-01-20 MED ORDER — PROPOFOL 1000 MG/100ML IV EMUL
INTRAVENOUS | Status: AC
  Filled 2016-01-20: qty 200

## 2016-01-20 MED ORDER — GLYCOPYRROLATE 0.2 MG/ML IJ SOLN
INTRAMUSCULAR | Status: DC | PRN
  Administered 2016-01-20: .6 mg via INTRAVENOUS

## 2016-01-20 MED ORDER — ROCURONIUM BROMIDE 100 MG/10ML IV SOLN
INTRAVENOUS | Status: DC | PRN
  Administered 2016-01-20: 30 mg via INTRAVENOUS

## 2016-01-20 MED ORDER — MEPERIDINE HCL 25 MG/ML IJ SOLN: 6.2500 mg | INTRAMUSCULAR | Status: DC | PRN

## 2016-01-20 MED ORDER — LIDOCAINE 2% (20 MG/ML) 5 ML SYRINGE
INTRAMUSCULAR | Status: AC
  Filled 2016-01-20: qty 5

## 2016-01-20 MED ORDER — SUCCINYLCHOLINE CHLORIDE 20 MG/ML IJ SOLN
INTRAMUSCULAR | Status: DC | PRN
  Administered 2016-01-20: 120 mg via INTRAVENOUS

## 2016-01-20 MED ORDER — GLYCOPYRROLATE 0.2 MG/ML IV SOSY
PREFILLED_SYRINGE | INTRAVENOUS | Status: AC
  Filled 2016-01-20: qty 3

## 2016-01-20 MED ORDER — 0.9 % SODIUM CHLORIDE (POUR BTL) OPTIME
TOPICAL | Status: DC | PRN
  Administered 2016-01-20: 1000 mL

## 2016-01-20 MED ORDER — DEXAMETHASONE SODIUM PHOSPHATE 10 MG/ML IJ SOLN
INTRAMUSCULAR | Status: DC | PRN
  Administered 2016-01-20: 5 mg via INTRAVENOUS

## 2016-01-20 MED ORDER — NEOSTIGMINE METHYLSULFATE 5 MG/5ML IV SOSY
PREFILLED_SYRINGE | INTRAVENOUS | Status: AC
  Filled 2016-01-20: qty 5

## 2016-01-20 MED ORDER — ROCURONIUM BROMIDE 50 MG/5ML IV SOLN
INTRAVENOUS | Status: AC
  Filled 2016-01-20: qty 1

## 2016-01-20 MED ORDER — FENTANYL CITRATE (PF) 100 MCG/2ML IJ SOLN
INTRAMUSCULAR | Status: AC
  Administered 2016-01-20: 50 ug via INTRAVENOUS
  Filled 2016-01-20: qty 2

## 2016-01-20 MED ORDER — SODIUM CHLORIDE 0.9 % IV SOLN
INTRAVENOUS | Status: DC | PRN
  Administered 2016-01-20: 12:00:00

## 2016-01-20 MED ORDER — LIDOCAINE HCL (PF) 1 % IJ SOLN
INTRAMUSCULAR | Status: AC
  Filled 2016-01-20: qty 30

## 2016-01-20 MED ORDER — MIDAZOLAM HCL 2 MG/2ML IJ SOLN
INTRAMUSCULAR | Status: AC
  Filled 2016-01-20: qty 2

## 2016-01-20 MED ORDER — ONDANSETRON HCL 4 MG/2ML IJ SOLN
INTRAMUSCULAR | Status: DC | PRN
  Administered 2016-01-20: 4 mg via INTRAVENOUS

## 2016-01-20 MED ORDER — FENTANYL CITRATE (PF) 100 MCG/2ML IJ SOLN
INTRAMUSCULAR | Status: DC | PRN
  Administered 2016-01-20: 50 ug via INTRAVENOUS
  Administered 2016-01-20: 100 ug via INTRAVENOUS

## 2016-01-20 MED ORDER — LACTATED RINGERS IV SOLN: INTRAVENOUS | Status: DC

## 2016-01-20 MED ORDER — PROTAMINE SULFATE 10 MG/ML IV SOLN
INTRAVENOUS | Status: DC | PRN
  Administered 2016-01-20: 50 mg via INTRAVENOUS

## 2016-01-20 SURGICAL SUPPLY — 28 items
CANISTER SUCTION 2500CC (MISCELLANEOUS) ×2 IMPLANT
CANNULA VESSEL 3MM 2 BLNT TIP (CANNULA) IMPLANT
CLIP TI MEDIUM 6 (CLIP) ×2 IMPLANT
CLIP TI WIDE RED SMALL 6 (CLIP) ×2 IMPLANT
COVER PROBE W GEL 5X96 (DRAPES) ×2 IMPLANT
DECANTER SPIKE VIAL GLASS SM (MISCELLANEOUS) ×2 IMPLANT
DRAIN PENROSE 1/4X12 LTX STRL (WOUND CARE) ×2 IMPLANT
ELECT REM PT RETURN 9FT ADLT (ELECTROSURGICAL) ×2
ELECTRODE REM PT RTRN 9FT ADLT (ELECTROSURGICAL) ×1 IMPLANT
GEL ULTRASOUND 20GR AQUASONIC (MISCELLANEOUS) IMPLANT
GLOVE BIO SURGEON STRL SZ7.5 (GLOVE) ×2 IMPLANT
GOWN STRL REUS W/ TWL LRG LVL3 (GOWN DISPOSABLE) ×3 IMPLANT
GOWN STRL REUS W/TWL LRG LVL3 (GOWN DISPOSABLE) ×6
KIT BASIN OR (CUSTOM PROCEDURE TRAY) ×2 IMPLANT
KIT ROOM TURNOVER OR (KITS) ×2 IMPLANT
LIQUID BAND (GAUZE/BANDAGES/DRESSINGS) ×2 IMPLANT
LOOP VESSEL MINI RED (MISCELLANEOUS) IMPLANT
NS IRRIG 1000ML POUR BTL (IV SOLUTION) ×2 IMPLANT
PACK CV ACCESS (CUSTOM PROCEDURE TRAY) ×2 IMPLANT
PAD ARMBOARD 7.5X6 YLW CONV (MISCELLANEOUS) ×4 IMPLANT
SPONGE SURGIFOAM ABS GEL 100 (HEMOSTASIS) IMPLANT
SUT PROLENE 5 0 C 1 24 (SUTURE) ×1 IMPLANT
SUT PROLENE 7 0 BV 1 (SUTURE) ×2 IMPLANT
SUT VIC AB 3-0 SH 27 (SUTURE) ×2
SUT VIC AB 3-0 SH 27X BRD (SUTURE) ×1 IMPLANT
SUT VICRYL 4-0 PS2 18IN ABS (SUTURE) ×2 IMPLANT
UNDERPAD 30X30 INCONTINENT (UNDERPADS AND DIAPERS) ×2 IMPLANT
WATER STERILE IRR 1000ML POUR (IV SOLUTION) ×2 IMPLANT

## 2016-01-20 NOTE — Telephone Encounter (Signed)
Sched appt 7/27 at 11:15. Ph# has no vm, lm on emergency contact's ph.

## 2016-01-20 NOTE — Telephone Encounter (Signed)
-----   Message from Mena Goes, RN sent at 01/20/2016  2:28 PM EDT ----- Regarding: schedule 2-3- week postop   ----- Message -----    From: Elam Dutch, MD    Sent: 01/20/2016   2:07 PM      To: Vvs Charge Pool  Plication left arm AVF Nurse asst  Pt needs follow up appt in 2-3 weeks  Ruta Hinds

## 2016-01-20 NOTE — Anesthesia Procedure Notes (Signed)
Procedure Name: Intubation Date/Time: 01/20/2016 1:11 PM Performed by: Freddie Breech Pre-anesthesia Checklist: Patient identified, Emergency Drugs available, Suction available, Patient being monitored and Timeout performed Patient Re-evaluated:Patient Re-evaluated prior to inductionOxygen Delivery Method: Circle system utilized Preoxygenation: Pre-oxygenation with 100% oxygen Intubation Type: IV induction Ventilation: Mask ventilation without difficulty and Oral airway inserted - appropriate to patient size Laryngoscope Size: Mac and 4 Grade View: Grade II Tube type: Oral Tube size: 7.5 mm Number of attempts: 1 Airway Equipment and Method: Patient positioned with wedge pillow and Stylet Placement Confirmation: ETT inserted through vocal cords under direct vision,  positive ETCO2,  CO2 detector and breath sounds checked- equal and bilateral Secured at: 23 cm Tube secured with: Tape Dental Injury: Teeth and Oropharynx as per pre-operative assessment

## 2016-01-20 NOTE — Anesthesia Postprocedure Evaluation (Signed)
Anesthesia Post Note  Patient: [name]  Procedure(s) Performed: Procedure(s) (LRB): PLICATION OF DISTAL ANEURYSMAL SEGEMENT OF LEFT UPPER ARM ARTERIOVENOUS FISTULA (Left)  Patient location during evaluation: PACU Anesthesia Type: General Level of consciousness: awake and alert Pain management: pain level controlled Vital Signs Assessment: post-procedure vital signs reviewed and stable Respiratory status: spontaneous breathing, nonlabored ventilation, respiratory function stable and patient connected to nasal cannula oxygen Cardiovascular status: blood pressure returned to baseline and stable Postop Assessment: no signs of nausea or vomiting Anesthetic complications: no    Last Vitals:  Filed Vitals:   01/20/16 1440 01/20/16 1502  BP: 170/71 189/80  Pulse: 74 73  Temp:  36.4 C  Resp: 16 27    Last Pain:  Filed Vitals:   01/20/16 1508  PainSc: 3                  Tiajuana Amass

## 2016-01-20 NOTE — Op Note (Signed)
Procedure: Plication of left upper extremity AV fistula  Preoperative diagnosis: Aneurysmal degeneration left arm AV fistula   Postoperative diagnosis: Same  Anesthesia: Gen.  Assistant: Delena Serve RNFA  Operative findings: #1 plication of proximal third of left upper extremity AV fistula          Operative details: After obtaining informed consent, the patient was taken to the operating room. The patient was placed in supine position on the operating room table. After administration of general anesthesia, the entire left upper extremity was prepped and draped in usual sterile fashion. A longitudinal incision was made over the proximal third of the fistula incorporating an area of aneurysmal degeneration. The fistula was dissected free circumferentially above and below the aneurysm. The patient was given 5000 units of intravenous heparin. The fistula was clamped proximally and distally above and below the aneurysm. The aneurysm was opened longitudinally and the redundant segment fistula excised. The area of resection was then reapproximated using a running 5-0 Prolene suture. The clamps were removed and flow restored to the fistula. Hemostasis was obtained with administration of 50 mg of protamine and one repair stitch at the proximal anastomosis. Some of the redundant skin was then resected. The subcutaneous tissues were reapproximated using a running 3-0 Vicryl suture. The skin was closed with a 4 0 Vicryl subcuticular stitch. Dermabond was applied to the skin incision.  The patient tolerated procedure well and there were no complications. Instrument sponge and needle counts were correct at the end of the case. The patient was taken to the recovery room in stable condition.   Joshua Hinds, MD [facility] [place] Office: [phone] Pager: [phone]

## 2016-01-20 NOTE — Interval H&P Note (Signed)
History and Physical Interval Note:  01/20/2016 9:59 AM  [name]  has presented today for surgery, with the diagnosis of End Stage Renal Disease N18.6; Aneurysmal segment of left upper arm arteriovenous fistula I72.9  The various methods of treatment have been discussed with the patient and family. After consideration of risks, benefits and other options for treatment, the patient has consented to  Procedure(s): PLICATION OF DISTAL ANEURYSMAL SEGEMENT OF ARTERIOVENOUS FISTULA (Left) as a surgical intervention .  The patient's history has been reviewed, patient examined, no change in status, stable for surgery.  I have reviewed the patient's chart and labs.  Questions were answered to the patient's satisfaction.     Ruta Hinds

## 2016-01-20 NOTE — Anesthesia Preprocedure Evaluation (Signed)
Anesthesia Evaluation  Patient identified by MRN, date of birth, ID band Patient awake    Reviewed: Allergy & Precautions, NPO status , Patient's Chart, lab work & pertinent test results  Airway Mallampati: II  TM Distance: >3 FB Neck ROM: Full    Dental no notable dental hx. (+) Poor Dentition   Pulmonary Current Smoker,    Pulmonary exam normal breath sounds clear to auscultation       Cardiovascular hypertension, Pt. on medications negative cardio ROS Normal cardiovascular exam Rhythm:Regular Rate:Normal     Neuro/Psych negative neurological ROS  negative psych ROS   GI/Hepatic negative GI ROS, (+)     substance abuse  cocaine use, Hepatitis -, C, B  Endo/Other  negative endocrine ROS  Renal/GU ESRF and DialysisRenal disease  negative genitourinary   Musculoskeletal negative musculoskeletal ROS (+)   Abdominal   Peds negative pediatric ROS (+)  Hematology negative hematology ROS (+)   Anesthesia Other Findings   Reproductive/Obstetrics negative OB ROS                             Anesthesia Physical Anesthesia Plan  ASA: IV  Anesthesia Plan: General   Post-op Pain Management:    Induction: Intravenous, Rapid sequence and Cricoid pressure planned  Airway Management Planned: Oral ETT  Additional Equipment:   Intra-op Plan:   Post-operative Plan: Extubation in OR  Informed Consent: I have reviewed the patients History and Physical, chart, labs and discussed the procedure including the risks, benefits and alternatives for the proposed anesthesia with the patient or authorized representative who has indicated his/her understanding and acceptance.   Dental advisory given  Plan Discussed with: CRNA  Anesthesia Plan Comments: (Ate brownie at 0830 with meds. RSI ETT)        Anesthesia Quick Evaluation

## 2016-01-20 NOTE — Transfer of Care (Signed)
Immediate Anesthesia Transfer of Care Note  Patient: [name]  Procedure(s) Performed: Procedure(s): PLICATION OF DISTAL ANEURYSMAL SEGEMENT OF LEFT UPPER ARM ARTERIOVENOUS FISTULA (Left)  Patient Location: PACU  Anesthesia Type:General  Level of Consciousness:  sedated, patient cooperative and responds to stimulation  Airway & Oxygen Therapy:Patient Spontanous Breathing and Patient connected to face mask oxgen  Post-op Assessment:  Report given to PACU RN and Post -op Vital signs reviewed and stable  Post vital signs:  Reviewed and stable  Last Vitals:  Filed Vitals:   01/20/16 0857  BP: 193/59  Pulse: 66  Temp: 36.4 C  Resp: 18    Complications: No apparent anesthesia complications

## 2016-01-20 NOTE — H&P (View-Only) (Signed)
Established Dialysis Access  History of Present Illness  [name] is a 55 y.o. ([birth year]) male patient that Dr. Kellie Simmering last saw in May of 2017, was referred by Dr. Otelia Santee for evaluation of a scab on the left brachial-cephalic AV fistula. Patient's fistula is functioning nicely. Patient has been having pain in the arm with occasional coldness down the forearm area and the hand and also pain up into the shoulder and occasionally into the left chest with shortness of breath. He states that this is due to the fistula "pushing on a nerve". He was told this by an orthopedic surgeon who evaluated him. Dr. Augustin Coupe referred him to evaluate removal of the scab and revision of the fistula. I discussed this at length with the patient but he refused to agree to have the scab removed stating that he wants the fistula "moved" or tied off.  Patient would not agree to revision of the fistula with removal of the eschar. He feels that the fistula is the problem causing his pain because it "pushing on a nerve". At pt's May 2017 visit Dr. Kellie Simmering discussed this with Dr. Augustin Coupe and the fact that the patient and Dr. Kellie Simmering could not come to agreement on what to do. Dr. Kellie Simmering did not recommend ligation of the fistula as he did not think this will remove any symptoms he is experiencing. Patient was to meet with Dr. Augustin Coupe and come up with a plan before we proceed with any further surgery. If we get rid of the fistula it would require ligation of the fistula with insertion of temporary catheter and later creation of fistula in the contralateral right arm.  On 0000000 he had a plication of proximal third of left upper extremity AVF after arriving through the ED with bleeding from a portion of his left arm AVF.  Pt returns today after a phone call on 01/05/16 from nurse at the [facility]. Reported pt has a penny-size open sore on the left arm AVF; pt reported there was a scab present, and he picked it  off. Requested an ASAP appt to evaluate, based on recent episode of bleeding at AVF site. Appt. Offered for 01/05/16 at 1:15 or 2:15 PM. Nurse reported that the pt. Stated he is not able to come to office that day. Pt does not know how long the scab has been on the distal aspect of an aneurysmal segment of the left upper arm AVF. He states that he is afraid that this will bleed as did the a more distal segment of the AVF that was plicated on [id number] after he came through the ED. He dialyzes MWF. He denies steal sx's now in his left forearm or hand, states he does have some steal sx's during HD that seem tolerable.  He is right hand dominant.   He smokes 3/4 ppd x 37 years.   Past Medical History  Diagnosis Date  . Chronic kidney disease   . Hypertension   . Hepatitis B, chronic ([place])   . Hepatitis C   . Anemia   . Metabolic bone disease   . Renal disorder   . Chest pain     DATE UNKNOWN, C/O PERIODICALLY  . GERD (gastroesophageal reflux disease)     DATE UNKNOWN  . Headache(784.0)     OCCASIONALLY  . Dialysis patient ([place])     Monday-Wednesday-Friday  . Hyperkalemia   . Pulmonary edema   . Hemorrhoids   . Adenomatous colon  polyp     tubular  . Myocardial infarction ([place])   . Renal insufficiency     Social History Social History  Substance Use Topics  . Smoking status: Current Every Day Smoker -- 0.75 packs/day for 37 years    Types: Cigarettes  . Smokeless tobacco: Never Used  . Alcohol Use: No     Comment: 1 beer/2 weeks.     Family History Family History  Problem Relation Age of Onset  . Heart disease Mother   . Lung cancer Mother   . Heart disease Father   . Malignant hyperthermia Father   . Hypertension Other   . COPD Other   . Colon cancer Neg Hx   . Throat cancer Sister     Surgical History Past Surgical History  Procedure Laterality Date  . Av fistula placement  2012    BELIEVED WAS PLACED IN [place]  . Hemorrhoid banding    . Ligation of  arteriovenous  fistula Left [id number]    Procedure: Plication of Left Arm Arteriovenous Fistula;  Surgeon: Elam Dutch, MD;  Location: [facility];  Service: Vascular;  Laterality: Left;    Allergies  Allergen Reactions  . Aspirin   . Clonidine Derivatives Other (See Comments)    Clonidine patch causes stomach pain  . Tramadol Itching    Current Outpatient Prescriptions  Medication Sig Dispense Refill  . amLODipine (NORVASC) 10 MG tablet Take 10 mg by mouth at bedtime.     . carvedilol (COREG) 25 MG tablet Take 25 mg by mouth 2 (two) times daily with a meal.    . cinacalcet (SENSIPAR) 30 MG tablet Take 30 mg by mouth daily.    . DULERA 100-5 MCG/ACT AERO Inhale 2 puffs into the lungs 2 (two) times daily.     Marland Kitchen lisinopril (PRINIVIL,ZESTRIL) 20 MG tablet Take 40 mg by mouth daily.     . minoxidil (LONITEN) 10 MG tablet Take 5 mg by mouth 2 (two) times daily.     Marland Kitchen oxyCODONE-acetaminophen (PERCOCET/ROXICET) 5-325 MG tablet Take 2 tablets by mouth every 6 (six) hours as needed for severe pain. 10 tablet 0  . RENVELA 800 MG tablet Take 1,600-3,200 mg by mouth 3 (three) times daily with meals. Take 4 tablets (3200 mg) with each meal and 2 (1600 mg) tablets with snacks.     No current facility-administered medications for this visit.     REVIEW OF SYSTEMS: see HPI for pertinent positives and negatives    PHYSICAL EXAMINATION:  Filed Vitals:   01/08/16 1003 01/08/16 1005  BP: 173/75 176/78  Pulse: 75   Temp: 96.3 F (35.7 C)   TempSrc: Oral   Height: 5\' 9"  (1.753 m)   Weight: 152 lb 11.2 oz (69.264 kg)   SpO2: 98%    Body mass index is 22.54 kg/(m^2).  General: The patient appears their stated age.   HEENT:  No gross abnormalities Pulmonary: Respirations are non-labored Abdomen: Soft and non-tender with normal pitched bowel sounds. Musculoskeletal: There are no major deformities.   Neurologic: No focal weakness or paresthesias are detected Skin: There are no rashes  noted. Psychiatric: The patient has normal affect. Cardiovascular: There is a regular rate and rhythm without significant murmur appreciated.  Vascular: Left upper extremity with brachial-cephalic AV fistula which is dilated and very tortuous except where recently plicated at the distal portion. There is a thinned area over the distal aneurysmal section. This area measures about a centimeter in diameter. The most distal aspect  of the AVF aneurysm was plicated on 0000000 and is well healed.There is no purulence, drainage, or evidence of any bleeding. There is 2+ radial pulse palpable distally. The left hand is well perfused.     Medical Decision Making  MURDOCK WINNER is a 55 y.o. male who presents with a thinned area over the most distal aneurysmal dilation of his left arm AVF. The most recent plicated area of the AVF is healing well; this area should not be used for HD access for several more weeks to allow for more complete healing. The most proximal aspect of his AVF should be accessed for HD. Dr. Oneida Alar spoke with and examined pt. Will schedule him for plication of distal aneurysmal segment of left upper arm AVF, there is a thinning area on this aneurysmal segment, on 01/20/16 with Dr. Oneida Alar.  The patient was counseled re smoking cessation and given several free resources re smoking cessation.    NICKEL, Sharmon Leyden, RN, MSN, FNP-C [facility] [place] Office: [phone]  01/08/2016, 10:22 AM  Clinic MD: Oneida Alar

## 2016-01-21 ENCOUNTER — Encounter (HOSPITAL_COMMUNITY): Payer: Self-pay | Admitting: Vascular Surgery

## 2016-01-21 NOTE — OR Nursing (Signed)
Addendum created to document Recovery Care Complete time

## 2016-01-23 ENCOUNTER — Telehealth: Payer: Self-pay

## 2016-01-23 NOTE — Telephone Encounter (Signed)
Discussed with Dr. Bridgett Larsson.  Advised that the Nephrologist will need to determine next step/ need for dialysis catheter.  Called Crystal at [facility].  Advised of recommendation.  Verb. Understanding.

## 2016-01-23 NOTE — Telephone Encounter (Signed)
phone call rec'd from nurse at Dialysis center.  Reported during dialysis today, the pt's venous site was cannulated, and it immediately swelled up and c/o extreme pain.  Reported bleeding at cannulation site, that was stopped with steady pressure.  Reported unable to get blood flow rate above 200, and unable to dialyze today.  Reported the pt. is currently 4 kg. over his estimated dry weight.   Stated they sent the pt. Home without being able to run a treatment.  Does not have a dialysis cath in place.   Advised will speak to MD, and return call.

## 2016-01-27 DIAGNOSIS — J45909 Unspecified asthma, uncomplicated: Secondary | ICD-10-CM | POA: Diagnosis not present

## 2016-01-27 DIAGNOSIS — H538 Other visual disturbances: Secondary | ICD-10-CM | POA: Diagnosis not present

## 2016-01-27 DIAGNOSIS — Z720 Tobacco use: Secondary | ICD-10-CM | POA: Diagnosis not present

## 2016-01-27 DIAGNOSIS — Z01118 Encounter for examination of ears and hearing with other abnormal findings: Secondary | ICD-10-CM | POA: Diagnosis not present

## 2016-01-27 DIAGNOSIS — I10 Essential (primary) hypertension: Secondary | ICD-10-CM | POA: Diagnosis not present

## 2016-01-27 DIAGNOSIS — Z0000 Encounter for general adult medical examination without abnormal findings: Secondary | ICD-10-CM | POA: Diagnosis not present

## 2016-01-27 DIAGNOSIS — Z136 Encounter for screening for cardiovascular disorders: Secondary | ICD-10-CM | POA: Diagnosis not present

## 2016-01-27 DIAGNOSIS — Z5181 Encounter for therapeutic drug level monitoring: Secondary | ICD-10-CM | POA: Diagnosis not present

## 2016-01-27 DIAGNOSIS — N186 End stage renal disease: Secondary | ICD-10-CM | POA: Diagnosis not present

## 2016-01-27 DIAGNOSIS — Z131 Encounter for screening for diabetes mellitus: Secondary | ICD-10-CM | POA: Diagnosis not present

## 2016-01-29 ENCOUNTER — Encounter: Payer: Self-pay | Admitting: Vascular Surgery

## 2016-02-05 ENCOUNTER — Encounter: Payer: Medicare Other | Admitting: Vascular Surgery

## 2016-02-09 DIAGNOSIS — N186 End stage renal disease: Secondary | ICD-10-CM | POA: Diagnosis not present

## 2016-02-09 DIAGNOSIS — Z992 Dependence on renal dialysis: Secondary | ICD-10-CM | POA: Diagnosis not present

## 2016-02-09 DIAGNOSIS — I120 Hypertensive chronic kidney disease with stage 5 chronic kidney disease or end stage renal disease: Secondary | ICD-10-CM | POA: Diagnosis not present

## 2016-02-11 DIAGNOSIS — N2581 Secondary hyperparathyroidism of renal origin: Secondary | ICD-10-CM | POA: Diagnosis not present

## 2016-02-11 DIAGNOSIS — D509 Iron deficiency anemia, unspecified: Secondary | ICD-10-CM | POA: Diagnosis not present

## 2016-02-11 DIAGNOSIS — D631 Anemia in chronic kidney disease: Secondary | ICD-10-CM | POA: Diagnosis not present

## 2016-02-11 DIAGNOSIS — N186 End stage renal disease: Secondary | ICD-10-CM | POA: Diagnosis not present

## 2016-02-13 DIAGNOSIS — D509 Iron deficiency anemia, unspecified: Secondary | ICD-10-CM | POA: Diagnosis not present

## 2016-02-13 DIAGNOSIS — D631 Anemia in chronic kidney disease: Secondary | ICD-10-CM | POA: Diagnosis not present

## 2016-02-13 DIAGNOSIS — N186 End stage renal disease: Secondary | ICD-10-CM | POA: Diagnosis not present

## 2016-02-13 DIAGNOSIS — N2581 Secondary hyperparathyroidism of renal origin: Secondary | ICD-10-CM | POA: Diagnosis not present

## 2016-02-16 DIAGNOSIS — N186 End stage renal disease: Secondary | ICD-10-CM | POA: Diagnosis not present

## 2016-02-16 DIAGNOSIS — N2581 Secondary hyperparathyroidism of renal origin: Secondary | ICD-10-CM | POA: Diagnosis not present

## 2016-02-16 DIAGNOSIS — D631 Anemia in chronic kidney disease: Secondary | ICD-10-CM | POA: Diagnosis not present

## 2016-02-16 DIAGNOSIS — D509 Iron deficiency anemia, unspecified: Secondary | ICD-10-CM | POA: Diagnosis not present

## 2016-02-18 DIAGNOSIS — N2581 Secondary hyperparathyroidism of renal origin: Secondary | ICD-10-CM | POA: Diagnosis not present

## 2016-02-18 DIAGNOSIS — N186 End stage renal disease: Secondary | ICD-10-CM | POA: Diagnosis not present

## 2016-02-18 DIAGNOSIS — D509 Iron deficiency anemia, unspecified: Secondary | ICD-10-CM | POA: Diagnosis not present

## 2016-02-18 DIAGNOSIS — D631 Anemia in chronic kidney disease: Secondary | ICD-10-CM | POA: Diagnosis not present

## 2016-02-20 DIAGNOSIS — N2581 Secondary hyperparathyroidism of renal origin: Secondary | ICD-10-CM | POA: Diagnosis not present

## 2016-02-20 DIAGNOSIS — D509 Iron deficiency anemia, unspecified: Secondary | ICD-10-CM | POA: Diagnosis not present

## 2016-02-20 DIAGNOSIS — D631 Anemia in chronic kidney disease: Secondary | ICD-10-CM | POA: Diagnosis not present

## 2016-02-20 DIAGNOSIS — N186 End stage renal disease: Secondary | ICD-10-CM | POA: Diagnosis not present

## 2016-02-23 DIAGNOSIS — D509 Iron deficiency anemia, unspecified: Secondary | ICD-10-CM | POA: Diagnosis not present

## 2016-02-23 DIAGNOSIS — N2581 Secondary hyperparathyroidism of renal origin: Secondary | ICD-10-CM | POA: Diagnosis not present

## 2016-02-23 DIAGNOSIS — N186 End stage renal disease: Secondary | ICD-10-CM | POA: Diagnosis not present

## 2016-02-23 DIAGNOSIS — D631 Anemia in chronic kidney disease: Secondary | ICD-10-CM | POA: Diagnosis not present

## 2016-02-24 ENCOUNTER — Encounter: Payer: Self-pay | Admitting: Vascular Surgery

## 2016-02-25 DIAGNOSIS — D631 Anemia in chronic kidney disease: Secondary | ICD-10-CM | POA: Diagnosis not present

## 2016-02-25 DIAGNOSIS — D509 Iron deficiency anemia, unspecified: Secondary | ICD-10-CM | POA: Diagnosis not present

## 2016-02-25 DIAGNOSIS — N186 End stage renal disease: Secondary | ICD-10-CM | POA: Diagnosis not present

## 2016-02-25 DIAGNOSIS — N2581 Secondary hyperparathyroidism of renal origin: Secondary | ICD-10-CM | POA: Diagnosis not present

## 2016-02-26 ENCOUNTER — Encounter: Payer: Self-pay | Admitting: Vascular Surgery

## 2016-02-26 ENCOUNTER — Other Ambulatory Visit: Payer: Self-pay

## 2016-02-26 ENCOUNTER — Ambulatory Visit (HOSPITAL_COMMUNITY)
Admission: RE | Admit: 2016-02-26 | Discharge: 2016-02-26 | Disposition: A | Discharge status: Home / Self Care | Payer: Medicare Other | Source: Ambulatory Visit | Attending: Vascular Surgery | Admitting: Vascular Surgery

## 2016-02-26 ENCOUNTER — Other Ambulatory Visit: Payer: Self-pay | Admitting: Vascular Surgery

## 2016-02-26 ENCOUNTER — Ambulatory Visit (INDEPENDENT_AMBULATORY_CARE_PROVIDER_SITE_OTHER): Payer: Self-pay | Admitting: Vascular Surgery

## 2016-02-26 VITALS — BP 142/67 | HR 66 | Ht 69.0 in | Wt 155.9 lb

## 2016-02-26 DIAGNOSIS — Z01810 Encounter for preprocedural cardiovascular examination: Secondary | ICD-10-CM | POA: Diagnosis not present

## 2016-02-26 DIAGNOSIS — N19 Unspecified kidney failure: Secondary | ICD-10-CM

## 2016-02-26 DIAGNOSIS — N186 End stage renal disease: Secondary | ICD-10-CM | POA: Diagnosis not present

## 2016-02-26 DIAGNOSIS — Z992 Dependence on renal dialysis: Secondary | ICD-10-CM

## 2016-02-26 DIAGNOSIS — G894 Chronic pain syndrome: Secondary | ICD-10-CM

## 2016-02-26 NOTE — Progress Notes (Signed)
Patient is a 55 year old male status post plication of a left arm AV fistula 01/20/2016. The patient complains of chronic pain. Antecubital area of this fistula. This has been present for a long time. The plication was done for prevention of bleeding. At this point the patient wants to consider a new access in the contralateral arm. He complains that he has pain that radiates sometimes from the antecubital region down into the forearm and hand on the left side. He states that other times it radiates from the antecubital region up into the left shoulder area. He has had this complaint for several months. He was previously seen by my partner Dr. Kellie Simmering in May of this year with similar complaints. It was also not thought to be steal at that time. Dr. Kellie Simmering also discussed with the patient at that time the ligation of the fistula may not improve his pain symptoms. The patient is also requesting narcotic pain medication for control of his pain.  Review of systems: The patient denies shortness of breath. He denies chest pain.  Physical exam:  Vitals:   02/26/16 0911 02/26/16 0912  BP: (!) 148/66 (!) 142/67  Pulse: 66   SpO2: 98%   Weight: 155 lb 14.4 oz (70.7 kg)   Height: 5\' 9"  (1.753 m)     Extremities: Well-healed left arm incisions palpable thrill or audible bruit in fistula, left hand pink warm no ulcerations, right upper extremity 2+ brachial and radial pulse visible cephalic vein on the forearm and upper arm  Data: Vein mapping ultrasound of the right upper extremity was performed today. This shows the cephalic vein is 0000000 mm in diameter in the forearm 3 mm to 4 mm in the upper arm basilic vein was 3 mm in diameter  Assessment: Doing well status post plication of left upper extremity AV fistula. The patient has chronic pain related to his left arm AV fistula. I discussed with the patient today that ligation of his fistula may not improve his pain symptoms but I would consider ligating the  fistula if we were able to get a new access working in his contralateral arm. I also discussed with the patient today that we would not be providing him narcotic pain medication for the chronic pain that he has in his left upper extremity.  Plan: At this point the patient wishes to pursue placing a new access in the right upper extremity. We'll schedule him for a right radial cephalic fistula in about 2 weeks. Risks benefits possible complications and procedure details were expended patient today including not limited to bleeding infection non-maturation of fistula. It was also again emphasized to the patient that ligation of his current fistula in the left arm may not resolve his pain symptoms. The patient was also given a referral to pain management clinic.  Ruta Hinds, MD [facility] [place] Office: [phone] Pager: [phone]

## 2016-02-27 DIAGNOSIS — D509 Iron deficiency anemia, unspecified: Secondary | ICD-10-CM | POA: Diagnosis not present

## 2016-02-27 DIAGNOSIS — D631 Anemia in chronic kidney disease: Secondary | ICD-10-CM | POA: Diagnosis not present

## 2016-02-27 DIAGNOSIS — N186 End stage renal disease: Secondary | ICD-10-CM | POA: Diagnosis not present

## 2016-02-27 DIAGNOSIS — N2581 Secondary hyperparathyroidism of renal origin: Secondary | ICD-10-CM | POA: Diagnosis not present

## 2016-03-01 DIAGNOSIS — D631 Anemia in chronic kidney disease: Secondary | ICD-10-CM | POA: Diagnosis not present

## 2016-03-01 DIAGNOSIS — N186 End stage renal disease: Secondary | ICD-10-CM | POA: Diagnosis not present

## 2016-03-01 DIAGNOSIS — N2581 Secondary hyperparathyroidism of renal origin: Secondary | ICD-10-CM | POA: Diagnosis not present

## 2016-03-01 DIAGNOSIS — D509 Iron deficiency anemia, unspecified: Secondary | ICD-10-CM | POA: Diagnosis not present

## 2016-03-03 DIAGNOSIS — N186 End stage renal disease: Secondary | ICD-10-CM | POA: Diagnosis not present

## 2016-03-03 DIAGNOSIS — D509 Iron deficiency anemia, unspecified: Secondary | ICD-10-CM | POA: Diagnosis not present

## 2016-03-03 DIAGNOSIS — D631 Anemia in chronic kidney disease: Secondary | ICD-10-CM | POA: Diagnosis not present

## 2016-03-03 DIAGNOSIS — N2581 Secondary hyperparathyroidism of renal origin: Secondary | ICD-10-CM | POA: Diagnosis not present

## 2016-03-05 DIAGNOSIS — N2581 Secondary hyperparathyroidism of renal origin: Secondary | ICD-10-CM | POA: Diagnosis not present

## 2016-03-05 DIAGNOSIS — I10 Essential (primary) hypertension: Secondary | ICD-10-CM | POA: Diagnosis not present

## 2016-03-05 DIAGNOSIS — N186 End stage renal disease: Secondary | ICD-10-CM | POA: Diagnosis not present

## 2016-03-05 DIAGNOSIS — D631 Anemia in chronic kidney disease: Secondary | ICD-10-CM | POA: Diagnosis not present

## 2016-03-05 DIAGNOSIS — D509 Iron deficiency anemia, unspecified: Secondary | ICD-10-CM | POA: Diagnosis not present

## 2016-03-08 ENCOUNTER — Other Ambulatory Visit: Payer: Self-pay

## 2016-03-08 ENCOUNTER — Encounter (HOSPITAL_COMMUNITY): Payer: Self-pay | Admitting: Vascular Surgery

## 2016-03-08 ENCOUNTER — Encounter (HOSPITAL_COMMUNITY): Payer: Self-pay | Admitting: *Deleted

## 2016-03-08 DIAGNOSIS — D509 Iron deficiency anemia, unspecified: Secondary | ICD-10-CM | POA: Diagnosis not present

## 2016-03-08 DIAGNOSIS — F1910 Other psychoactive substance abuse, uncomplicated: Secondary | ICD-10-CM

## 2016-03-08 DIAGNOSIS — D631 Anemia in chronic kidney disease: Secondary | ICD-10-CM | POA: Diagnosis not present

## 2016-03-08 DIAGNOSIS — N186 End stage renal disease: Secondary | ICD-10-CM | POA: Diagnosis not present

## 2016-03-08 DIAGNOSIS — N2581 Secondary hyperparathyroidism of renal origin: Secondary | ICD-10-CM | POA: Diagnosis not present

## 2016-03-08 MED ORDER — DEXTROSE 5 % IV SOLN: 1.5000 g | INTRAVENOUS | Status: DC

## 2016-03-08 MED ORDER — SODIUM CHLORIDE 0.9 % IV SOLN: INTRAVENOUS | Status: DC

## 2016-03-08 NOTE — Progress Notes (Signed)
Pt denies any new/acute cardiopulmonary issues. Pt denies being under the care of a cardiologist but stated that an ultrasound of the heart was done last week Near [facility] in [place]. Pt could not recall the name of the physician or the name of the practice; records requested from [facility], PCP, Dr. Jeanie Cooks and Dr. Posey Pronto of [facility]. Spoke with Arbie Cookey, RN to make MD aware that pt stated that he last used crack cocaine " last week, not sure which day" and pt stated that he drinks daily ( refused to give details of alcohol consumption). Arbie Cookey stated that an order will be entered for UDS on DOS. Pt denies having a cardiac cath but stated that a stress test was done " many years ago at [facility] ( 2010)."   Pt made aware to stop taking fish oil, vitamins, herbal medication and NSAID's such as Ibuprofen, Advil, Naproxen ( Aleve) BC and Goody Powder. Pt verbalized understanding of all pre-op instructions.

## 2016-03-09 ENCOUNTER — Encounter (HOSPITAL_COMMUNITY): Payer: Self-pay | Admitting: Certified Registered Nurse Anesthetist

## 2016-03-09 ENCOUNTER — Ambulatory Visit (HOSPITAL_COMMUNITY)
Admission: RE | Admit: 2016-03-09 | Discharge: 2016-03-09 | Disposition: A | Discharge status: Home / Self Care | Payer: Medicare Other | Source: Ambulatory Visit | Attending: Vascular Surgery | Admitting: Vascular Surgery

## 2016-03-09 ENCOUNTER — Encounter (HOSPITAL_COMMUNITY)
Admission: RE | Disposition: A | Discharge status: Home / Self Care | Payer: Self-pay | Source: Ambulatory Visit | Attending: Vascular Surgery

## 2016-03-09 DIAGNOSIS — Z7951 Long term (current) use of inhaled steroids: Secondary | ICD-10-CM | POA: Insufficient documentation

## 2016-03-09 DIAGNOSIS — Z79899 Other long term (current) drug therapy: Secondary | ICD-10-CM | POA: Diagnosis not present

## 2016-03-09 DIAGNOSIS — Y832 Surgical operation with anastomosis, bypass or graft as the cause of abnormal reaction of the patient, or of later complication, without mention of misadventure at the time of the procedure: Secondary | ICD-10-CM | POA: Insufficient documentation

## 2016-03-09 DIAGNOSIS — M79622 Pain in left upper arm: Secondary | ICD-10-CM | POA: Diagnosis not present

## 2016-03-09 DIAGNOSIS — G8929 Other chronic pain: Secondary | ICD-10-CM | POA: Diagnosis not present

## 2016-03-09 DIAGNOSIS — T82848A Pain from vascular prosthetic devices, implants and grafts, initial encounter: Secondary | ICD-10-CM | POA: Insufficient documentation

## 2016-03-09 DIAGNOSIS — Z5309 Procedure and treatment not carried out because of other contraindication: Secondary | ICD-10-CM | POA: Insufficient documentation

## 2016-03-09 DIAGNOSIS — F1910 Other psychoactive substance abuse, uncomplicated: Secondary | ICD-10-CM

## 2016-03-09 HISTORY — DX: Pneumonia, unspecified organism: J18.9

## 2016-03-09 HISTORY — DX: Anxiety disorder, unspecified: F41.9

## 2016-03-09 LAB — RAPID URINE DRUG SCREEN, HOSP PERFORMED
Amphetamines: NOT DETECTED
Barbiturates: NOT DETECTED
Benzodiazepines: NOT DETECTED
Cocaine: POSITIVE — AB
Opiates: NOT DETECTED
Tetrahydrocannabinol: NOT DETECTED

## 2016-03-09 SURGERY — ARTERIOVENOUS (AV) FISTULA CREATION
Anesthesia: Monitor Anesthesia Care | Laterality: Right

## 2016-03-09 MED ORDER — PHENYLEPHRINE 40 MCG/ML (10ML) SYRINGE FOR IV PUSH (FOR BLOOD PRESSURE SUPPORT)
PREFILLED_SYRINGE | INTRAVENOUS | Status: AC
  Filled 2016-03-09: qty 10

## 2016-03-09 MED ORDER — PROPOFOL 10 MG/ML IV BOLUS
INTRAVENOUS | Status: AC
  Filled 2016-03-09: qty 20

## 2016-03-09 MED ORDER — CHLORHEXIDINE GLUCONATE CLOTH 2 % EX PADS: 6.0000 | MEDICATED_PAD | Freq: Once | CUTANEOUS | Status: DC

## 2016-03-09 MED ORDER — SODIUM CHLORIDE 0.9 % IV SOLN: INTRAVENOUS | Status: DC

## 2016-03-09 MED ORDER — ONDANSETRON HCL 4 MG/2ML IJ SOLN
INTRAMUSCULAR | Status: AC
  Filled 2016-03-09: qty 2

## 2016-03-09 NOTE — H&P (View-Only) (Signed)
Patient is a 55 year old male status post plication of a left arm AV fistula 01/20/2016. The patient complains of chronic pain. Antecubital area of this fistula. This has been present for a long time. The plication was done for prevention of bleeding. At this point the patient wants to consider a new access in the contralateral arm. He complains that he has pain that radiates sometimes from the antecubital region down into the forearm and hand on the left side. He states that other times it radiates from the antecubital region up into the left shoulder area. He has had this complaint for several months. He was previously seen by my partner Dr. Kellie Simmering in May of this year with similar complaints. It was also not thought to be steal at that time. Dr. Kellie Simmering also discussed with the patient at that time the ligation of the fistula may not improve his pain symptoms. The patient is also requesting narcotic pain medication for control of his pain.  Review of systems: The patient denies shortness of breath. He denies chest pain.  Physical exam:  Vitals:   02/26/16 0911 02/26/16 0912  BP: (!) 148/66 (!) 142/67  Pulse: 66   SpO2: 98%   Weight: 155 lb 14.4 oz (70.7 kg)   Height: 5\' 9"  (1.753 m)     Extremities: Well-healed left arm incisions palpable thrill or audible bruit in fistula, left hand pink warm no ulcerations, right upper extremity 2+ brachial and radial pulse visible cephalic vein on the forearm and upper arm  Data: Vein mapping ultrasound of the right upper extremity was performed today. This shows the cephalic vein is 0000000 mm in diameter in the forearm 3 mm to 4 mm in the upper arm basilic vein was 3 mm in diameter  Assessment: Doing well status post plication of left upper extremity AV fistula. The patient has chronic pain related to his left arm AV fistula. I discussed with the patient today that ligation of his fistula may not improve his pain symptoms but I would consider ligating the  fistula if we were able to get a new access working in his contralateral arm. I also discussed with the patient today that we would not be providing him narcotic pain medication for the chronic pain that he has in his left upper extremity.  Plan: At this point the patient wishes to pursue placing a new access in the right upper extremity. We'll schedule him for a right radial cephalic fistula in about 2 weeks. Risks benefits possible complications and procedure details were expended patient today including not limited to bleeding infection non-maturation of fistula. It was also again emphasized to the patient that ligation of his current fistula in the left arm may not resolve his pain symptoms. The patient was also given a referral to pain management clinic.  Ruta Hinds, MD [facility] [place] Office: [phone] Pager: [phone]

## 2016-03-09 NOTE — Progress Notes (Signed)
Surgery cancelled for today. Pt informed. Pt requested meal tray before changing clothes. IV d/c'd. Belongings given to patient. Patient stated there was no one present or to call for pick up and that his ride would be back later to pick him up. Tray requested.

## 2016-03-09 NOTE — Progress Notes (Signed)
Pt stated he was ready to go, pt escorted to [employer] where his transportation was waiting.

## 2016-03-09 NOTE — Interval H&P Note (Signed)
[facility] [place]  History and Physical Update  Pt's UDS COCAINE positive, so his case is canceled for today.  Adele Barthel, MD [facility] [place] Office: [phone] Pager: [phone]  03/09/2016, 8:50 AM

## 2016-03-10 ENCOUNTER — Telehealth: Payer: Self-pay | Admitting: Vascular Surgery

## 2016-03-10 DIAGNOSIS — N186 End stage renal disease: Secondary | ICD-10-CM | POA: Diagnosis not present

## 2016-03-10 DIAGNOSIS — D509 Iron deficiency anemia, unspecified: Secondary | ICD-10-CM | POA: Diagnosis not present

## 2016-03-10 DIAGNOSIS — D631 Anemia in chronic kidney disease: Secondary | ICD-10-CM | POA: Diagnosis not present

## 2016-03-10 DIAGNOSIS — N2581 Secondary hyperparathyroidism of renal origin: Secondary | ICD-10-CM | POA: Diagnosis not present

## 2016-03-10 LAB — POCT I-STAT 4, (NA,K, GLUC, HGB,HCT)
Glucose, Bld: 75 mg/dL (ref 65–99)
HCT: 37 % — ABNORMAL LOW (ref 39.0–52.0)
Hemoglobin: 12.6 g/dL — ABNORMAL LOW (ref 13.0–17.0)
Potassium: 4.3 mmol/L (ref 3.5–5.1)
Sodium: 134 mmol/L — ABNORMAL LOW (ref 135–145)

## 2016-03-10 NOTE — Telephone Encounter (Signed)
[name] w/ [facility] [phone] called to inform our office that they received the referral from Dr.Fields for this patient. He was contacted and he refused treatment or to make an appointment with them for pain management. awt

## 2016-03-11 DIAGNOSIS — I120 Hypertensive chronic kidney disease with stage 5 chronic kidney disease or end stage renal disease: Secondary | ICD-10-CM | POA: Diagnosis not present

## 2016-03-11 DIAGNOSIS — Z992 Dependence on renal dialysis: Secondary | ICD-10-CM | POA: Diagnosis not present

## 2016-03-11 DIAGNOSIS — N186 End stage renal disease: Secondary | ICD-10-CM | POA: Diagnosis not present

## 2016-03-11 LAB — POCT I-STAT 4, (NA,K, GLUC, HGB,HCT)
Glucose, Bld: 76 mg/dL (ref 65–99)
HCT: 37 % — ABNORMAL LOW (ref 39.0–52.0)
Hemoglobin: 12.6 g/dL — ABNORMAL LOW (ref 13.0–17.0)
Potassium: 7.4 mmol/L (ref 3.5–5.1)
Sodium: 132 mmol/L — ABNORMAL LOW (ref 135–145)

## 2016-03-12 DIAGNOSIS — D631 Anemia in chronic kidney disease: Secondary | ICD-10-CM | POA: Diagnosis not present

## 2016-03-12 DIAGNOSIS — D509 Iron deficiency anemia, unspecified: Secondary | ICD-10-CM | POA: Diagnosis not present

## 2016-03-12 DIAGNOSIS — Z23 Encounter for immunization: Secondary | ICD-10-CM | POA: Diagnosis not present

## 2016-03-12 DIAGNOSIS — N2581 Secondary hyperparathyroidism of renal origin: Secondary | ICD-10-CM | POA: Diagnosis not present

## 2016-03-12 DIAGNOSIS — N186 End stage renal disease: Secondary | ICD-10-CM | POA: Diagnosis not present

## 2016-03-15 DIAGNOSIS — D631 Anemia in chronic kidney disease: Secondary | ICD-10-CM | POA: Diagnosis not present

## 2016-03-15 DIAGNOSIS — N186 End stage renal disease: Secondary | ICD-10-CM | POA: Diagnosis not present

## 2016-03-15 DIAGNOSIS — Z23 Encounter for immunization: Secondary | ICD-10-CM | POA: Diagnosis not present

## 2016-03-15 DIAGNOSIS — N2581 Secondary hyperparathyroidism of renal origin: Secondary | ICD-10-CM | POA: Diagnosis not present

## 2016-03-15 DIAGNOSIS — D509 Iron deficiency anemia, unspecified: Secondary | ICD-10-CM | POA: Diagnosis not present

## 2016-03-17 DIAGNOSIS — D509 Iron deficiency anemia, unspecified: Secondary | ICD-10-CM | POA: Diagnosis not present

## 2016-03-17 DIAGNOSIS — D631 Anemia in chronic kidney disease: Secondary | ICD-10-CM | POA: Diagnosis not present

## 2016-03-17 DIAGNOSIS — N186 End stage renal disease: Secondary | ICD-10-CM | POA: Diagnosis not present

## 2016-03-17 DIAGNOSIS — N2581 Secondary hyperparathyroidism of renal origin: Secondary | ICD-10-CM | POA: Diagnosis not present

## 2016-03-17 DIAGNOSIS — Z23 Encounter for immunization: Secondary | ICD-10-CM | POA: Diagnosis not present

## 2016-03-18 ENCOUNTER — Telehealth: Payer: Self-pay | Admitting: *Deleted

## 2016-03-18 NOTE — Telephone Encounter (Signed)
I called patient to reschedule his Right arm AVF with Dr. Oneida Alar; originally scheduled 03-09-16 but cancelled d/t +UDS.  [name] declines to have surgery at this time because "his old site is working fine" and he will call us back "only if the old one stops working".  I informed him that I would notify his Kidney center. I called Adam, charge nurse at [facility], and he will relay info to their doctor.    I will place his surgery paperwork in our pending file.

## 2016-03-19 DIAGNOSIS — D631 Anemia in chronic kidney disease: Secondary | ICD-10-CM | POA: Diagnosis not present

## 2016-03-19 DIAGNOSIS — N2581 Secondary hyperparathyroidism of renal origin: Secondary | ICD-10-CM | POA: Diagnosis not present

## 2016-03-19 DIAGNOSIS — Z23 Encounter for immunization: Secondary | ICD-10-CM | POA: Diagnosis not present

## 2016-03-19 DIAGNOSIS — N186 End stage renal disease: Secondary | ICD-10-CM | POA: Diagnosis not present

## 2016-03-19 DIAGNOSIS — D509 Iron deficiency anemia, unspecified: Secondary | ICD-10-CM | POA: Diagnosis not present

## 2016-03-21 ENCOUNTER — Other Ambulatory Visit: Payer: Self-pay | Admitting: Internal Medicine

## 2016-03-22 DIAGNOSIS — D631 Anemia in chronic kidney disease: Secondary | ICD-10-CM | POA: Diagnosis not present

## 2016-03-22 DIAGNOSIS — N2581 Secondary hyperparathyroidism of renal origin: Secondary | ICD-10-CM | POA: Diagnosis not present

## 2016-03-22 DIAGNOSIS — N186 End stage renal disease: Secondary | ICD-10-CM | POA: Diagnosis not present

## 2016-03-22 DIAGNOSIS — D509 Iron deficiency anemia, unspecified: Secondary | ICD-10-CM | POA: Diagnosis not present

## 2016-03-22 DIAGNOSIS — Z23 Encounter for immunization: Secondary | ICD-10-CM | POA: Diagnosis not present

## 2016-03-24 DIAGNOSIS — N2581 Secondary hyperparathyroidism of renal origin: Secondary | ICD-10-CM | POA: Diagnosis not present

## 2016-03-24 DIAGNOSIS — D631 Anemia in chronic kidney disease: Secondary | ICD-10-CM | POA: Diagnosis not present

## 2016-03-24 DIAGNOSIS — Z23 Encounter for immunization: Secondary | ICD-10-CM | POA: Diagnosis not present

## 2016-03-24 DIAGNOSIS — N186 End stage renal disease: Secondary | ICD-10-CM | POA: Diagnosis not present

## 2016-03-24 DIAGNOSIS — D509 Iron deficiency anemia, unspecified: Secondary | ICD-10-CM | POA: Diagnosis not present

## 2016-03-26 ENCOUNTER — Emergency Department (HOSPITAL_COMMUNITY)
Admission: EM | Admit: 2016-03-26 | Discharge: 2016-03-26 | Disposition: A | Discharge status: Home / Self Care | Payer: Medicare Other | Attending: Emergency Medicine | Admitting: Emergency Medicine

## 2016-03-26 ENCOUNTER — Encounter (HOSPITAL_COMMUNITY): Payer: Self-pay | Admitting: Emergency Medicine

## 2016-03-26 DIAGNOSIS — I120 Hypertensive chronic kidney disease with stage 5 chronic kidney disease or end stage renal disease: Secondary | ICD-10-CM | POA: Diagnosis not present

## 2016-03-26 DIAGNOSIS — F17210 Nicotine dependence, cigarettes, uncomplicated: Secondary | ICD-10-CM | POA: Insufficient documentation

## 2016-03-26 DIAGNOSIS — T82838A Hemorrhage of vascular prosthetic devices, implants and grafts, initial encounter: Secondary | ICD-10-CM

## 2016-03-26 DIAGNOSIS — N2581 Secondary hyperparathyroidism of renal origin: Secondary | ICD-10-CM | POA: Diagnosis not present

## 2016-03-26 DIAGNOSIS — Y733 Surgical instruments, materials and gastroenterology and urology devices (including sutures) associated with adverse incidents: Secondary | ICD-10-CM | POA: Diagnosis not present

## 2016-03-26 DIAGNOSIS — Z992 Dependence on renal dialysis: Secondary | ICD-10-CM | POA: Insufficient documentation

## 2016-03-26 DIAGNOSIS — Z23 Encounter for immunization: Secondary | ICD-10-CM | POA: Diagnosis not present

## 2016-03-26 DIAGNOSIS — Z79899 Other long term (current) drug therapy: Secondary | ICD-10-CM | POA: Diagnosis not present

## 2016-03-26 DIAGNOSIS — T148 Other injury of unspecified body region: Secondary | ICD-10-CM | POA: Diagnosis not present

## 2016-03-26 DIAGNOSIS — N186 End stage renal disease: Secondary | ICD-10-CM | POA: Diagnosis not present

## 2016-03-26 DIAGNOSIS — D631 Anemia in chronic kidney disease: Secondary | ICD-10-CM | POA: Diagnosis not present

## 2016-03-26 DIAGNOSIS — D509 Iron deficiency anemia, unspecified: Secondary | ICD-10-CM | POA: Diagnosis not present

## 2016-03-26 DIAGNOSIS — S59912A Unspecified injury of left forearm, initial encounter: Secondary | ICD-10-CM | POA: Diagnosis not present

## 2016-03-26 NOTE — ED Notes (Signed)
Pt verbalized understanding of d/c instructions and has no further questions. Pt stable and NAD. Pt given sandwich bag to go home with.

## 2016-03-26 NOTE — ED Notes (Signed)
Pt asking for sandwich.

## 2016-03-26 NOTE — ED Triage Notes (Signed)
Onset today woke up from sleeping and patent's dialysis access was bleeding left upper arm. Called EMS bleeding controlled with gauze wrap.  Denies trauma.

## 2016-03-26 NOTE — ED Provider Notes (Signed)
[facility] DEPT Provider Note   CSN: [id number] Arrival date & time: 03/26/16  1454  History   Chief Complaint Chief Complaint  Patient presents with  . Vascular Access Problem   HPI   [name] is an 55 y.o. male with history of ESRD on dialysis, HTN, Hep B, Hep C, cocaine abuse who presents to the ED for evaluation of bleeding AV fistula. He states he completed today's course of dialysis but afterwards bleeding from the fistula would not stop. He states he does not feel dizzy or lightheaded. Denies pain. He is irritable and not providing more history. He is demanding a sandwich. Unfortunately he is in a hallway bed currently and will not allow me to examine him further until he is in a room and has a sandwich.  Past Medical History:  Diagnosis Date  . Adenomatous colon polyp    tubular  . Anemia   . Anxiety   . Chest pain    DATE UNKNOWN, C/O PERIODICALLY  . Chronic kidney disease    dialysis - M/W/F  . Dialysis patient ([place])    Monday-Wednesday-Friday  . GERD (gastroesophageal reflux disease)    DATE UNKNOWN  . Hemorrhoids   . Hepatitis B, chronic ([place])   . Hepatitis C   . Hyperkalemia   . Hypertension   . Metabolic bone disease    Patient denies  . Pneumonia   . Pulmonary edema   . Renal disorder   . Renal insufficiency   . Shortness of breath dyspnea    " for the last past year with this dialysis"    Patient Active Problem List   Diagnosis Date Noted  . End stage renal disease ([place]) 12/02/2015  . Chronic hepatitis B ([place]) 03/05/2014  . Chronic hepatitis C without hepatic coma ([place]) 03/05/2014  . Internal hemorrhoids with bleeding, swelling and itching 03/05/2014  . Thrombocytopenia, unspecified ([place]) 03/05/2014  . Chest pain 02/27/2014  . ALCOHOL ABUSE 04/14/2009  . TOBACCO ABUSE 04/14/2009  . GANGLION CYST 04/14/2009    Past Surgical History:  Procedure Laterality Date  . AV FISTULA PLACEMENT  2012   BELIEVED WAS PLACED IN JUNE  .  HEMORRHOID BANDING    . LIGATION OF ARTERIOVENOUS  FISTULA Left 01/12/4192   Procedure: Plication of Left Arm Arteriovenous Fistula;  Surgeon: Elam Dutch, MD;  Location: [facility];  Service: Vascular;  Laterality: Left;  . REVISON OF ARTERIOVENOUS FISTULA Left 7/90/2409   Procedure: PLICATION OF DISTAL ANEURYSMAL SEGEMENT OF LEFT UPPER ARM ARTERIOVENOUS FISTULA;  Surgeon: Elam Dutch, MD;  Location: [facility];  Service: Vascular;  Laterality: Left;       Home Medications    Prior to Admission medications   Medication Sig Start Date End Date Taking? Authorizing Provider  amLODipine (NORVASC) 10 MG tablet Take 10 mg by mouth at bedtime.     Historical Provider, MD  carvedilol (COREG) 25 MG tablet Take 25 mg by mouth 2 (two) times daily with a meal.    Historical Provider, MD  cinacalcet (SENSIPAR) 30 MG tablet Take 30 mg by mouth daily.    Historical Provider, MD  DULERA 100-5 MCG/ACT AERO Inhale 2 puffs into the lungs 2 (two) times daily as needed for wheezing or shortness of breath.  09/22/15   Historical Provider, MD  lisinopril (PRINIVIL,ZESTRIL) 20 MG tablet Take 40 mg by mouth daily.  09/07/12   Historical Provider, MD  minoxidil (LONITEN) 10 MG tablet Take 5 mg by mouth 2 (two)  times daily.  07/17/13   Historical Provider, MD  oxyCODONE-acetaminophen (PERCOCET/ROXICET) 5-325 MG tablet Take 1 tablet by mouth every 6 (six) hours as needed for moderate pain. Patient not taking: Reported on 03/02/2016 01/20/16   Elam Dutch, MD  polyethylene glycol powder (GLYCOLAX/MIRALAX) powder MIX 17 GM WITH 8 OUNCES OF LIQUID AND TAKE BY MOUTH DAILY 03/22/16   Gatha Mayer, MD  RENVELA 800 MG tablet Take 1,600-3,200 mg by mouth 3 (three) times daily with meals. Take 4 tablets (3200 mg) with each meal and 2 (1600 mg) tablets with snacks. 07/09/13   Historical Provider, MD  VENTOLIN HFA 108 (90 Base) MCG/ACT inhaler Inhale 2 puffs into the lungs every 6 (six) hours as needed for wheezing or shortness of  breath.  01/03/16   Historical Provider, MD    Family History Family History  Problem Relation Age of Onset  . Heart disease Mother   . Lung cancer Mother   . Heart disease Father   . Malignant hyperthermia Father   . Throat cancer Sister   . Hypertension Other   . COPD Other   . Colon cancer Neg Hx     Social History Social History  Substance Use Topics  . Smoking status: Current Every Day Smoker    Packs/day: 0.75    Years: 37.00    Types: Cigarettes  . Smokeless tobacco: Never Used     Comment: reviewed on the phone  . Alcohol use 2.4 oz/week    1 Glasses of wine, 1 Cans of beer, 1 Shots of liquor, 1 Standard drinks or equivalent per week     Comment: daily alcohol     Allergies   Aspirin; Clonidine derivatives; and Tramadol   Review of Systems Review of Systems 10 Systems reviewed and are negative for acute change except as noted in the HPI.  Physical Exam Updated Vital Signs Ht 5\' 11"  (1.803 m)   Wt 70.3 kg   SpO2 96%   BMI 21.62 kg/m   Physical Exam  Constitutional: He is oriented to person, place, and time. No distress.  irritable  HENT:  Head: Atraumatic.  Right Ear: External ear normal.  Left Ear: External ear normal.  Nose: Nose normal.  Eyes: Conjunctivae are normal. No scleral icterus.  Cardiovascular: Normal rate and regular rhythm.   Pulmonary/Chest: Effort normal. No respiratory distress.  Abdominal: He exhibits no distension.  Neurological: He is alert and oriented to person, place, and time.  Skin: Skin is warm and dry. He is not diaphoretic.  LUE AVF wrapped in gauze, no bleeding visible through bandage. Pt will now allow me to remove the bandage to examine.  Psychiatric: He has a normal mood and affect. His behavior is normal.  Nursing note and vitals reviewed.    ED Treatments / Results  Labs (all labs ordered are listed, but only abnormal results are displayed) Labs Reviewed - No data to display  EKG  EKG  Interpretation None       Radiology No results found.  Procedures Procedures (including critical care time)  Medications Ordered in ED Medications - No data to display   Initial Impression / Assessment and Plan / ED Course  I have reviewed the triage vital signs and the nursing notes.  Pertinent labs & imaging results that were available during my care of the patient were reviewed by me and considered in my medical decision making (see chart for details).  Clinical Course   Eventually I was able to  talk to the pt and examine his arm. LUE with AVF with good thrill. There is a small puncture site that has no active bleeding at this time. Surrounding skin is clean and dry with no erythema or tenderness. I assisted pt in bandaging the area up and encouraged close PCP f/u. ER return precautions given.  Final Clinical Impressions(s) / ED Diagnoses   Final diagnoses:  Bleeding from dialysis shunt, initial encounter [facility])    New Prescriptions New Prescriptions   No medications on file     Carmelina Dane 03/26/16 1632    Charlesetta Shanks, MD 03/30/16 [phone]

## 2016-03-29 DIAGNOSIS — N186 End stage renal disease: Secondary | ICD-10-CM | POA: Diagnosis not present

## 2016-03-29 DIAGNOSIS — N2581 Secondary hyperparathyroidism of renal origin: Secondary | ICD-10-CM | POA: Diagnosis not present

## 2016-03-29 DIAGNOSIS — D631 Anemia in chronic kidney disease: Secondary | ICD-10-CM | POA: Diagnosis not present

## 2016-03-29 DIAGNOSIS — D509 Iron deficiency anemia, unspecified: Secondary | ICD-10-CM | POA: Diagnosis not present

## 2016-03-29 DIAGNOSIS — Z23 Encounter for immunization: Secondary | ICD-10-CM | POA: Diagnosis not present

## 2016-03-31 DIAGNOSIS — Z23 Encounter for immunization: Secondary | ICD-10-CM | POA: Diagnosis not present

## 2016-03-31 DIAGNOSIS — N186 End stage renal disease: Secondary | ICD-10-CM | POA: Diagnosis not present

## 2016-03-31 DIAGNOSIS — D509 Iron deficiency anemia, unspecified: Secondary | ICD-10-CM | POA: Diagnosis not present

## 2016-03-31 DIAGNOSIS — D631 Anemia in chronic kidney disease: Secondary | ICD-10-CM | POA: Diagnosis not present

## 2016-03-31 DIAGNOSIS — N2581 Secondary hyperparathyroidism of renal origin: Secondary | ICD-10-CM | POA: Diagnosis not present

## 2016-04-02 DIAGNOSIS — N186 End stage renal disease: Secondary | ICD-10-CM | POA: Diagnosis not present

## 2016-04-02 DIAGNOSIS — Z23 Encounter for immunization: Secondary | ICD-10-CM | POA: Diagnosis not present

## 2016-04-02 DIAGNOSIS — N2581 Secondary hyperparathyroidism of renal origin: Secondary | ICD-10-CM | POA: Diagnosis not present

## 2016-04-02 DIAGNOSIS — D509 Iron deficiency anemia, unspecified: Secondary | ICD-10-CM | POA: Diagnosis not present

## 2016-04-02 DIAGNOSIS — D631 Anemia in chronic kidney disease: Secondary | ICD-10-CM | POA: Diagnosis not present

## 2016-04-05 DIAGNOSIS — N186 End stage renal disease: Secondary | ICD-10-CM | POA: Diagnosis not present

## 2016-04-05 DIAGNOSIS — N2581 Secondary hyperparathyroidism of renal origin: Secondary | ICD-10-CM | POA: Diagnosis not present

## 2016-04-05 DIAGNOSIS — D509 Iron deficiency anemia, unspecified: Secondary | ICD-10-CM | POA: Diagnosis not present

## 2016-04-05 DIAGNOSIS — Z23 Encounter for immunization: Secondary | ICD-10-CM | POA: Diagnosis not present

## 2016-04-05 DIAGNOSIS — D631 Anemia in chronic kidney disease: Secondary | ICD-10-CM | POA: Diagnosis not present

## 2016-04-07 DIAGNOSIS — D631 Anemia in chronic kidney disease: Secondary | ICD-10-CM | POA: Diagnosis not present

## 2016-04-07 DIAGNOSIS — Z23 Encounter for immunization: Secondary | ICD-10-CM | POA: Diagnosis not present

## 2016-04-07 DIAGNOSIS — D509 Iron deficiency anemia, unspecified: Secondary | ICD-10-CM | POA: Diagnosis not present

## 2016-04-07 DIAGNOSIS — N2581 Secondary hyperparathyroidism of renal origin: Secondary | ICD-10-CM | POA: Diagnosis not present

## 2016-04-07 DIAGNOSIS — N186 End stage renal disease: Secondary | ICD-10-CM | POA: Diagnosis not present

## 2016-04-10 DIAGNOSIS — Z23 Encounter for immunization: Secondary | ICD-10-CM | POA: Diagnosis not present

## 2016-04-10 DIAGNOSIS — N186 End stage renal disease: Secondary | ICD-10-CM | POA: Diagnosis not present

## 2016-04-10 DIAGNOSIS — I120 Hypertensive chronic kidney disease with stage 5 chronic kidney disease or end stage renal disease: Secondary | ICD-10-CM | POA: Diagnosis not present

## 2016-04-10 DIAGNOSIS — N2581 Secondary hyperparathyroidism of renal origin: Secondary | ICD-10-CM | POA: Diagnosis not present

## 2016-04-10 DIAGNOSIS — D631 Anemia in chronic kidney disease: Secondary | ICD-10-CM | POA: Diagnosis not present

## 2016-04-10 DIAGNOSIS — Z992 Dependence on renal dialysis: Secondary | ICD-10-CM | POA: Diagnosis not present

## 2016-04-10 DIAGNOSIS — D509 Iron deficiency anemia, unspecified: Secondary | ICD-10-CM | POA: Diagnosis not present

## 2016-04-12 DIAGNOSIS — D631 Anemia in chronic kidney disease: Secondary | ICD-10-CM | POA: Diagnosis not present

## 2016-04-12 DIAGNOSIS — N186 End stage renal disease: Secondary | ICD-10-CM | POA: Diagnosis not present

## 2016-04-12 DIAGNOSIS — D509 Iron deficiency anemia, unspecified: Secondary | ICD-10-CM | POA: Diagnosis not present

## 2016-04-12 DIAGNOSIS — N2581 Secondary hyperparathyroidism of renal origin: Secondary | ICD-10-CM | POA: Diagnosis not present

## 2016-04-20 ENCOUNTER — Encounter: Payer: Self-pay | Admitting: Vascular Surgery

## 2016-04-20 DIAGNOSIS — D638 Anemia in other chronic diseases classified elsewhere: Secondary | ICD-10-CM | POA: Diagnosis not present

## 2016-04-20 DIAGNOSIS — N186 End stage renal disease: Secondary | ICD-10-CM | POA: Diagnosis not present

## 2016-04-20 DIAGNOSIS — D696 Thrombocytopenia, unspecified: Secondary | ICD-10-CM | POA: Diagnosis not present

## 2016-04-20 DIAGNOSIS — J45909 Unspecified asthma, uncomplicated: Secondary | ICD-10-CM | POA: Diagnosis not present

## 2016-04-20 DIAGNOSIS — B1910 Unspecified viral hepatitis B without hepatic coma: Secondary | ICD-10-CM | POA: Diagnosis not present

## 2016-04-20 DIAGNOSIS — I10 Essential (primary) hypertension: Secondary | ICD-10-CM | POA: Diagnosis not present

## 2016-04-20 DIAGNOSIS — B182 Chronic viral hepatitis C: Secondary | ICD-10-CM | POA: Diagnosis not present

## 2016-04-20 DIAGNOSIS — I429 Cardiomyopathy, unspecified: Secondary | ICD-10-CM | POA: Diagnosis not present

## 2016-04-22 ENCOUNTER — Ambulatory Visit (INDEPENDENT_AMBULATORY_CARE_PROVIDER_SITE_OTHER): Payer: Medicare Other | Admitting: Vascular Surgery

## 2016-04-22 ENCOUNTER — Encounter: Payer: Self-pay | Admitting: Vascular Surgery

## 2016-04-22 VITALS — BP 147/73 | HR 62 | Temp 97.1°F | Resp 20 | Ht 69.0 in | Wt 150.0 lb

## 2016-04-22 DIAGNOSIS — Z992 Dependence on renal dialysis: Secondary | ICD-10-CM

## 2016-04-22 DIAGNOSIS — N186 End stage renal disease: Secondary | ICD-10-CM

## 2016-04-22 NOTE — Progress Notes (Signed)
Patient is a 55 year old male who returns for follow-up today. He was recently scheduled to have a right radiocephalic AV fistula placed in preparation for ligation of a left upper arm fistula which has been causing him pain. Unfortunately in the preop holding area he tested positive for cocaine so his operation was canceled by anesthesia. He now returns for further follow-up. He states that he is still occasionally using cocaine. I discussed with him at length today that he needs to stop this from a cardiac risk perspective as well as the fact that we cannot schedule any elective procedures for him unless he is not testing positive for cocaine. I discussed with him that there is anesthetic risk associated with this. He states that he has been having some occasional chest pain and has a cardiology visit scheduled with Dr. Nadyne Coombes on November 3. He still complains of pain from his left arm fistula. He still wishes to have a new access placed.  Past Medical History:  Diagnosis Date  . Adenomatous colon polyp    tubular  . Anemia   . Anxiety   . Chest pain    DATE UNKNOWN, C/O PERIODICALLY  . Chronic kidney disease    dialysis - M/W/F  . Dialysis patient ([place])    Monday-Wednesday-Friday  . GERD (gastroesophageal reflux disease)    DATE UNKNOWN  . Hemorrhoids   . Hepatitis B, chronic ([place])   . Hepatitis C   . Hyperkalemia   . Hypertension   . Metabolic bone disease    Patient denies  . Pneumonia   . Pulmonary edema   . Renal disorder   . Renal insufficiency   . Shortness of breath dyspnea    " for the last past year with this dialysis"    Past Surgical History:  Procedure Laterality Date  . AV FISTULA PLACEMENT  2012   BELIEVED WAS PLACED IN JUNE  . HEMORRHOID BANDING    . LIGATION OF ARTERIOVENOUS  FISTULA Left 08/18/5168   Procedure: Plication of Left Arm Arteriovenous Fistula;  Surgeon: Elam Dutch, MD;  Location: [facility];  Service: Vascular;  Laterality: Left;  . REVISON OF  ARTERIOVENOUS FISTULA Left 0/17/4944   Procedure: PLICATION OF DISTAL ANEURYSMAL SEGEMENT OF LEFT UPPER ARM ARTERIOVENOUS FISTULA;  Surgeon: Elam Dutch, MD;  [facility];  Service: Vascular;  Laterality: Left;    Review of systems: He denies shortness of breath. Chest pain as mentioned above.  Current Outpatient Prescriptions on File Prior to Visit  Medication Sig Dispense Refill  . amLODipine (NORVASC) 10 MG tablet Take 10 mg by mouth at bedtime.     . carvedilol (COREG) 25 MG tablet Take 25 mg by mouth 2 (two) times daily with a meal.    . cinacalcet (SENSIPAR) 30 MG tablet Take 30 mg by mouth daily.    . DULERA 100-5 MCG/ACT AERO Inhale 2 puffs into the lungs 2 (two) times daily as needed for wheezing or shortness of breath.     . lisinopril (PRINIVIL,ZESTRIL) 20 MG tablet Take 40 mg by mouth daily.     . minoxidil (LONITEN) 10 MG tablet Take 5 mg by mouth 2 (two) times daily.     Marland Kitchen oxyCODONE-acetaminophen (PERCOCET/ROXICET) 5-325 MG tablet Take 1 tablet by mouth every 6 (six) hours as needed for moderate pain. 10 tablet 0  . polyethylene glycol powder (GLYCOLAX/MIRALAX) powder MIX 17 GM WITH 8 OUNCES OF LIQUID AND TAKE BY MOUTH DAILY 527 g 0  . RENVELA  800 MG tablet Take 1,600-3,200 mg by mouth 3 (three) times daily with meals. Take 4 tablets (3200 mg) with each meal and 2 (1600 mg) tablets with snacks.    . VENTOLIN HFA 108 (90 Base) MCG/ACT inhaler Inhale 2 puffs into the lungs every 6 (six) hours as needed for wheezing or shortness of breath.      No current facility-administered medications on file prior to visit.     Physical exam:  Vitals:   04/22/16 1035  BP: (!) 147/73  Pulse: 62  Resp: 20  Temp: 97.1 F (36.2 C)  TempSrc: Oral  SpO2: 95%  Weight: 150 lb (68 kg)  Height: 5\' 9"  (1.753 m)    Left upper extremity: Palpable thrill left upper arm fistula which is diffusely aneurysmal.  Right upper extremity: Palpable radial and brachial pulse.  Data: I  reviewed the patient's recent vein mapping which shows a 7-3/2-2 mm cephalic vein at the wrist and at the antecubital level. He also has a 3 mm basilic vein in the right arm.  Assessment: Patient with pain in the left arm AV fistula. We will plan to place a right radiocephalic AV fistula but this will need to mature fully before we consider ligation of his left arm fistula. The patient will need to get off of cocaine before we can do an elective procedure.  Plan: Schedule his right radiocephalic AV fistula after his cardiology evaluation by Dr. Nadyne Coombes. The patient understands that if he tests positive for cocaine on the day of his operation it will be canceled. Risks benefits possible complications and procedure details including not limited to bleeding infection ischemic steal non-maturation of fistula were explained to the patient today. He understands and agrees to proceed.  Ruta Hinds, MD [facility] Office: [phone] Pager: [phone]

## 2016-05-11 DIAGNOSIS — I120 Hypertensive chronic kidney disease with stage 5 chronic kidney disease or end stage renal disease: Secondary | ICD-10-CM | POA: Diagnosis not present

## 2016-05-11 DIAGNOSIS — N186 End stage renal disease: Secondary | ICD-10-CM | POA: Diagnosis not present

## 2016-05-11 DIAGNOSIS — Z992 Dependence on renal dialysis: Secondary | ICD-10-CM | POA: Diagnosis not present

## 2016-05-12 DIAGNOSIS — N186 End stage renal disease: Secondary | ICD-10-CM | POA: Diagnosis not present

## 2016-05-12 DIAGNOSIS — D631 Anemia in chronic kidney disease: Secondary | ICD-10-CM | POA: Diagnosis not present

## 2016-05-12 DIAGNOSIS — D509 Iron deficiency anemia, unspecified: Secondary | ICD-10-CM | POA: Diagnosis not present

## 2016-05-12 DIAGNOSIS — N2581 Secondary hyperparathyroidism of renal origin: Secondary | ICD-10-CM | POA: Diagnosis not present

## 2016-05-13 DIAGNOSIS — I517 Cardiomegaly: Secondary | ICD-10-CM | POA: Diagnosis not present

## 2016-05-13 DIAGNOSIS — I10 Essential (primary) hypertension: Secondary | ICD-10-CM | POA: Diagnosis not present

## 2016-05-13 DIAGNOSIS — R9431 Abnormal electrocardiogram [ECG] [EKG]: Secondary | ICD-10-CM | POA: Diagnosis not present

## 2016-05-13 DIAGNOSIS — R0609 Other forms of dyspnea: Secondary | ICD-10-CM | POA: Diagnosis not present

## 2016-05-14 DIAGNOSIS — N2581 Secondary hyperparathyroidism of renal origin: Secondary | ICD-10-CM | POA: Diagnosis not present

## 2016-05-14 DIAGNOSIS — N186 End stage renal disease: Secondary | ICD-10-CM | POA: Diagnosis not present

## 2016-05-14 DIAGNOSIS — D509 Iron deficiency anemia, unspecified: Secondary | ICD-10-CM | POA: Diagnosis not present

## 2016-05-14 DIAGNOSIS — D631 Anemia in chronic kidney disease: Secondary | ICD-10-CM | POA: Diagnosis not present

## 2016-05-17 DIAGNOSIS — D509 Iron deficiency anemia, unspecified: Secondary | ICD-10-CM | POA: Diagnosis not present

## 2016-05-17 DIAGNOSIS — N186 End stage renal disease: Secondary | ICD-10-CM | POA: Diagnosis not present

## 2016-05-17 DIAGNOSIS — N2581 Secondary hyperparathyroidism of renal origin: Secondary | ICD-10-CM | POA: Diagnosis not present

## 2016-05-17 DIAGNOSIS — D631 Anemia in chronic kidney disease: Secondary | ICD-10-CM | POA: Diagnosis not present

## 2016-05-19 DIAGNOSIS — D509 Iron deficiency anemia, unspecified: Secondary | ICD-10-CM | POA: Diagnosis not present

## 2016-05-19 DIAGNOSIS — N2581 Secondary hyperparathyroidism of renal origin: Secondary | ICD-10-CM | POA: Diagnosis not present

## 2016-05-19 DIAGNOSIS — N186 End stage renal disease: Secondary | ICD-10-CM | POA: Diagnosis not present

## 2016-05-19 DIAGNOSIS — D631 Anemia in chronic kidney disease: Secondary | ICD-10-CM | POA: Diagnosis not present

## 2016-05-21 DIAGNOSIS — N2581 Secondary hyperparathyroidism of renal origin: Secondary | ICD-10-CM | POA: Diagnosis not present

## 2016-05-21 DIAGNOSIS — D631 Anemia in chronic kidney disease: Secondary | ICD-10-CM | POA: Diagnosis not present

## 2016-05-21 DIAGNOSIS — N186 End stage renal disease: Secondary | ICD-10-CM | POA: Diagnosis not present

## 2016-05-21 DIAGNOSIS — D509 Iron deficiency anemia, unspecified: Secondary | ICD-10-CM | POA: Diagnosis not present

## 2016-05-23 IMAGING — CR DG SHOULDER 2+V*L*
3 series · 3 of 3 positions shown · non-contrast
Comparison: None.

CLINICAL DATA: Worsening left shoulder pain for several months.
Worse with movement. No known injury.

EXAM:
LEFT SHOULDER - 2+ VIEW

[shoulder grashey]
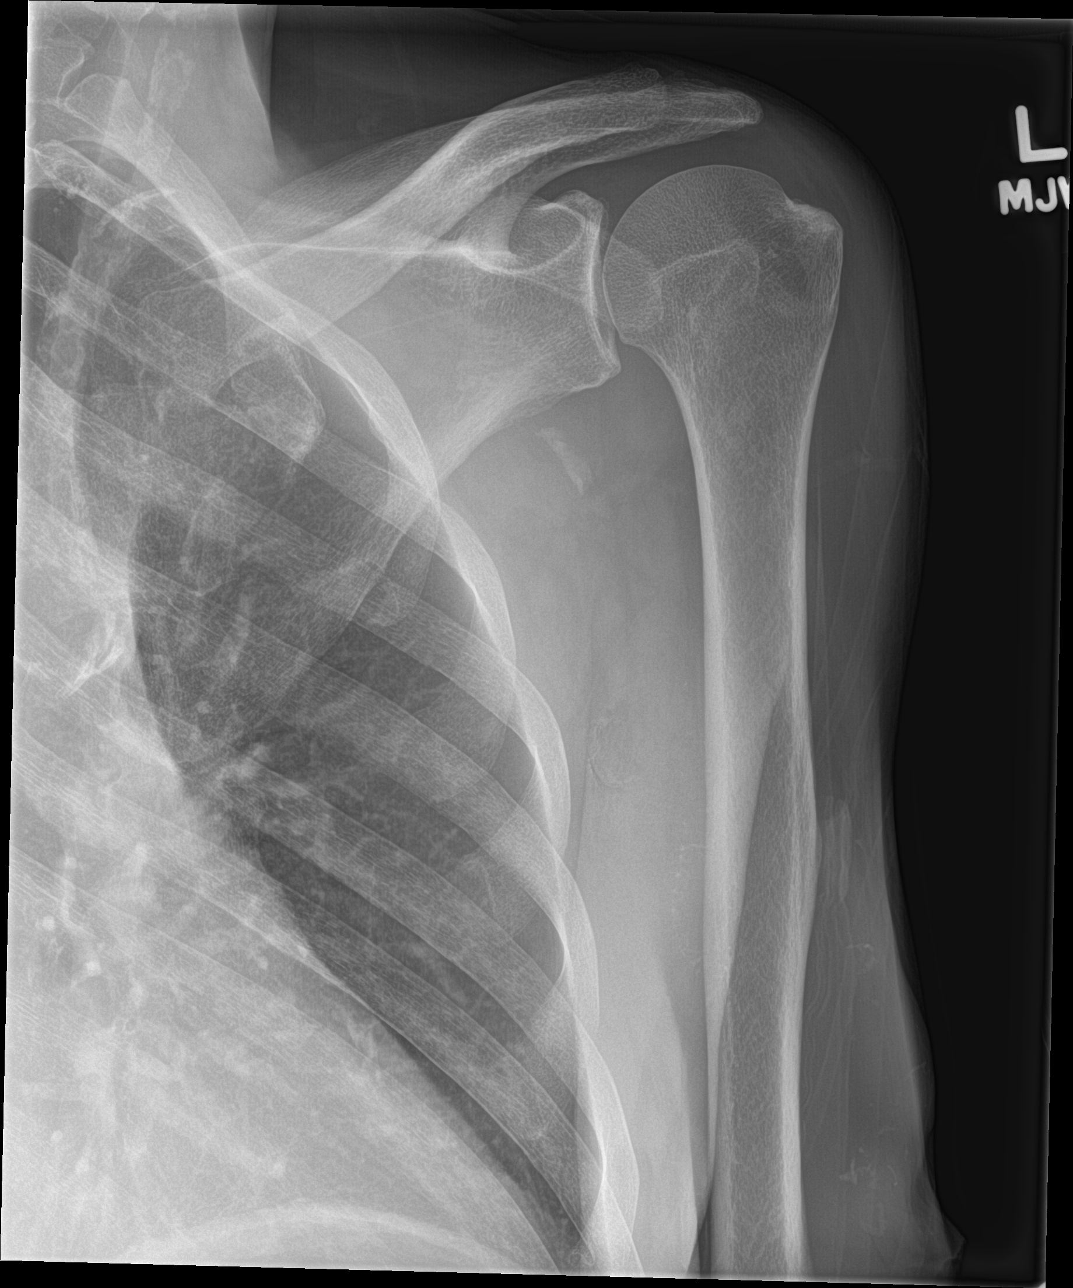

[shoulder y view]
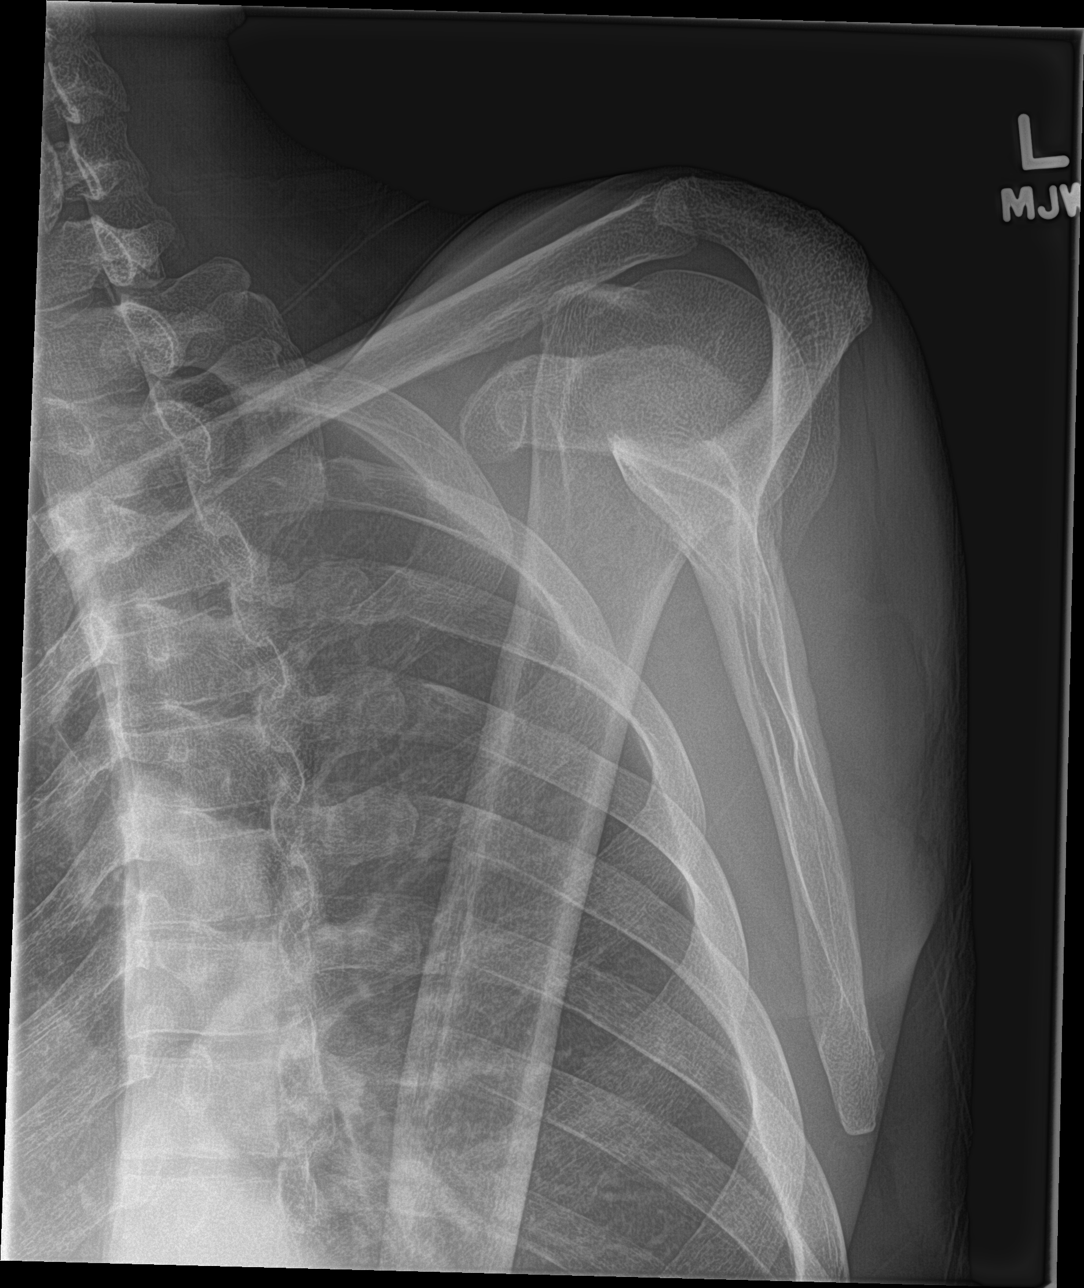

[shoulder axillary]
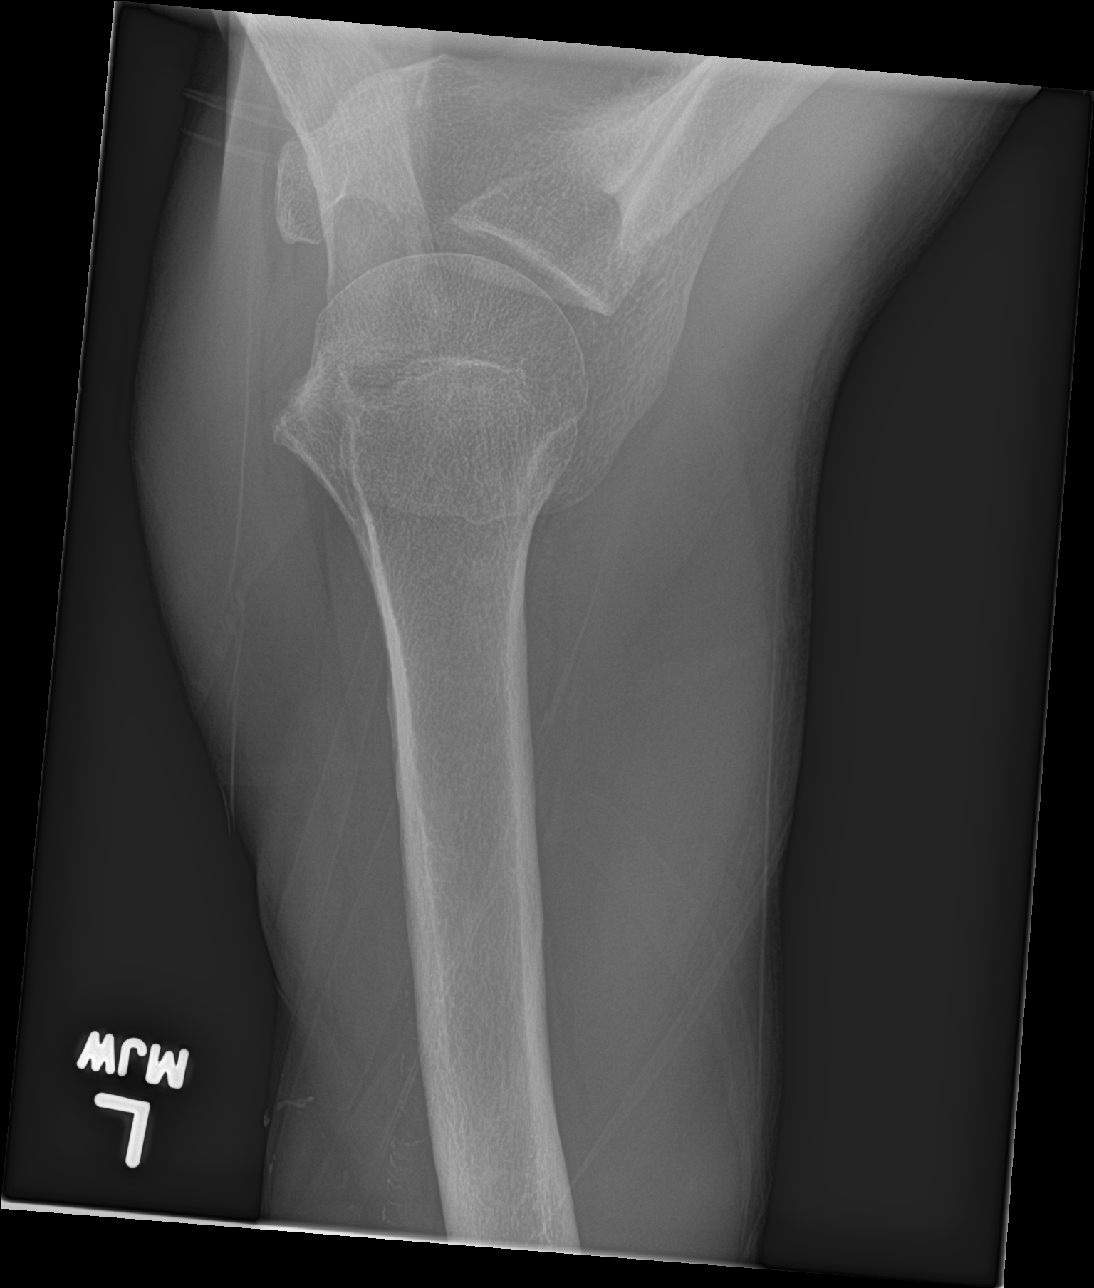

[3 of 3 positions shown; findings below may reference images not displayed]

FINDINGS: There is no evidence of fracture or dislocation. There is no
evidence of arthropathy or other focal bone abnormality. Probable
atherosclerotic vascular calcification noted in the region of the
left axillary artery.
IMPRESSION: No radiographic abnormality of left shoulder joint. Probable
atherosclerotic calcification noted in left axillary artery.

## 2016-05-24 DIAGNOSIS — D509 Iron deficiency anemia, unspecified: Secondary | ICD-10-CM | POA: Diagnosis not present

## 2016-05-24 DIAGNOSIS — N186 End stage renal disease: Secondary | ICD-10-CM | POA: Diagnosis not present

## 2016-05-24 DIAGNOSIS — D631 Anemia in chronic kidney disease: Secondary | ICD-10-CM | POA: Diagnosis not present

## 2016-05-24 DIAGNOSIS — N2581 Secondary hyperparathyroidism of renal origin: Secondary | ICD-10-CM | POA: Diagnosis not present

## 2016-05-26 DIAGNOSIS — N2581 Secondary hyperparathyroidism of renal origin: Secondary | ICD-10-CM | POA: Diagnosis not present

## 2016-05-26 DIAGNOSIS — D631 Anemia in chronic kidney disease: Secondary | ICD-10-CM | POA: Diagnosis not present

## 2016-05-26 DIAGNOSIS — D509 Iron deficiency anemia, unspecified: Secondary | ICD-10-CM | POA: Diagnosis not present

## 2016-05-26 DIAGNOSIS — N186 End stage renal disease: Secondary | ICD-10-CM | POA: Diagnosis not present

## 2016-05-28 ENCOUNTER — Other Ambulatory Visit: Payer: Self-pay

## 2016-05-28 DIAGNOSIS — N186 End stage renal disease: Secondary | ICD-10-CM | POA: Diagnosis not present

## 2016-05-28 DIAGNOSIS — N2581 Secondary hyperparathyroidism of renal origin: Secondary | ICD-10-CM | POA: Diagnosis not present

## 2016-05-28 DIAGNOSIS — D631 Anemia in chronic kidney disease: Secondary | ICD-10-CM | POA: Diagnosis not present

## 2016-05-28 DIAGNOSIS — F1410 Cocaine abuse, uncomplicated: Secondary | ICD-10-CM

## 2016-05-28 DIAGNOSIS — D509 Iron deficiency anemia, unspecified: Secondary | ICD-10-CM | POA: Diagnosis not present

## 2016-05-29 ENCOUNTER — Encounter (HOSPITAL_COMMUNITY): Payer: Self-pay

## 2016-05-29 ENCOUNTER — Emergency Department (HOSPITAL_COMMUNITY)
Admission: EM | Admit: 2016-05-29 | Discharge: 2016-05-29 | Disposition: A | Discharge status: Home / Self Care | Payer: Medicare Other | Attending: Emergency Medicine | Admitting: Emergency Medicine

## 2016-05-29 ENCOUNTER — Emergency Department (HOSPITAL_COMMUNITY): Payer: Medicare Other

## 2016-05-29 DIAGNOSIS — N186 End stage renal disease: Secondary | ICD-10-CM | POA: Diagnosis not present

## 2016-05-29 DIAGNOSIS — I120 Hypertensive chronic kidney disease with stage 5 chronic kidney disease or end stage renal disease: Secondary | ICD-10-CM | POA: Diagnosis not present

## 2016-05-29 DIAGNOSIS — R0600 Dyspnea, unspecified: Secondary | ICD-10-CM | POA: Diagnosis not present

## 2016-05-29 DIAGNOSIS — R109 Unspecified abdominal pain: Secondary | ICD-10-CM | POA: Diagnosis present

## 2016-05-29 DIAGNOSIS — R1084 Generalized abdominal pain: Secondary | ICD-10-CM | POA: Diagnosis not present

## 2016-05-29 DIAGNOSIS — Z79899 Other long term (current) drug therapy: Secondary | ICD-10-CM | POA: Diagnosis not present

## 2016-05-29 DIAGNOSIS — F17210 Nicotine dependence, cigarettes, uncomplicated: Secondary | ICD-10-CM | POA: Diagnosis not present

## 2016-05-29 DIAGNOSIS — Z992 Dependence on renal dialysis: Secondary | ICD-10-CM | POA: Diagnosis not present

## 2016-05-29 DIAGNOSIS — R0602 Shortness of breath: Secondary | ICD-10-CM | POA: Diagnosis not present

## 2016-05-29 LAB — COMPREHENSIVE METABOLIC PANEL
ALT: 33 U/L (ref 17–63)
AST: 46 U/L — ABNORMAL HIGH (ref 15–41)
Albumin: 3.8 g/dL (ref 3.5–5.0)
Alkaline Phosphatase: 64 U/L (ref 38–126)
Anion gap: 13 (ref 5–15)
BUN: 23 mg/dL — ABNORMAL HIGH (ref 6–20)
CO2: 31 mmol/L (ref 22–32)
Calcium: 10.1 mg/dL (ref 8.9–10.3)
Chloride: 93 mmol/L — ABNORMAL LOW (ref 101–111)
Creatinine, Ser: 6.02 mg/dL — ABNORMAL HIGH (ref 0.61–1.24)
GFR calc Af Amer: 11 mL/min — ABNORMAL LOW (ref >60)
GFR calc non Af Amer: 10 mL/min — ABNORMAL LOW (ref >60)
Glucose, Bld: 88 mg/dL (ref 65–99)
Potassium: 3.9 mmol/L (ref 3.5–5.1)
Sodium: 137 mmol/L (ref 135–145)
Total Bilirubin: 1.7 mg/dL — ABNORMAL HIGH (ref 0.3–1.2)
Total Protein: 7.7 g/dL (ref 6.5–8.1)

## 2016-05-29 LAB — CBC WITH DIFFERENTIAL/PLATELET
Basophils Absolute: 0 10*3/uL (ref 0.0–0.1)
Basophils Relative: 1 %
Eosinophils Absolute: 0 10*3/uL (ref 0.0–0.7)
Eosinophils Relative: 0 %
HCT: 29.9 % — ABNORMAL LOW (ref 39.0–52.0)
Hemoglobin: 10 g/dL — ABNORMAL LOW (ref 13.0–17.0)
Lymphocytes Relative: 28 %
Lymphs Abs: 1.5 10*3/uL (ref 0.7–4.0)
MCH: 27.6 pg (ref 26.0–34.0)
MCHC: 33.4 g/dL (ref 30.0–36.0)
MCV: 82.6 fL (ref 78.0–100.0)
Monocytes Absolute: 0.3 10*3/uL (ref 0.1–1.0)
Monocytes Relative: 6 %
Neutro Abs: 3.5 10*3/uL (ref 1.7–7.7)
Neutrophils Relative %: 65 %
Platelets: 68 10*3/uL — ABNORMAL LOW (ref 150–400)
RBC: 3.62 MIL/uL — ABNORMAL LOW (ref 4.22–5.81)
RDW: 18 % — ABNORMAL HIGH (ref 11.5–15.5)
WBC: 5.3 10*3/uL (ref 4.0–10.5)

## 2016-05-29 LAB — LIPASE, BLOOD: Lipase: 30 U/L (ref 11–51)

## 2016-05-29 MED ORDER — LISINOPRIL 20 MG PO TABS
40.0000 mg | ORAL_TABLET | Freq: Once | ORAL | Status: AC
  Administered 2016-05-29: 40 mg via ORAL
  Filled 2016-05-29: qty 2

## 2016-05-29 MED ORDER — LIDOCAINE-PRILOCAINE 2.5-2.5 % EX CREA
1.0000 "application " | TOPICAL_CREAM | CUTANEOUS | Status: DC | PRN
  Filled 2016-05-29: qty 5

## 2016-05-29 MED ORDER — HEPARIN SODIUM (PORCINE) 1000 UNIT/ML DIALYSIS
1000.0000 [IU] | INTRAMUSCULAR | Status: DC | PRN
  Filled 2016-05-29: qty 1

## 2016-05-29 MED ORDER — AMLODIPINE BESYLATE 5 MG PO TABS
10.0000 mg | ORAL_TABLET | Freq: Once | ORAL | Status: AC
  Administered 2016-05-29: 10 mg via ORAL
  Filled 2016-05-29: qty 2

## 2016-05-29 MED ORDER — SODIUM CHLORIDE 0.9 % IV SOLN: 100.0000 mL | INTRAVENOUS | Status: DC | PRN

## 2016-05-29 MED ORDER — LIDOCAINE HCL (PF) 1 % IJ SOLN: 5.0000 mL | INTRAMUSCULAR | Status: DC | PRN

## 2016-05-29 MED ORDER — PENTAFLUOROPROP-TETRAFLUOROETH EX AERO
1.0000 "application " | INHALATION_SPRAY | CUTANEOUS | Status: DC | PRN
  Filled 2016-05-29: qty 30

## 2016-05-29 MED ORDER — HEPARIN SODIUM (PORCINE) 1000 UNIT/ML DIALYSIS
20.0000 [IU]/kg | INTRAMUSCULAR | Status: DC | PRN
  Filled 2016-05-29: qty 2

## 2016-05-29 NOTE — ED Triage Notes (Signed)
Dialysis patient from home c/o abdominal pain.  Denies N/V/D.  Unknown last bowel movement.  M/W/F dialysis.  Pt states he doesn't think dialysis yesterday pulled off enough fluid.

## 2016-05-29 NOTE — ED Notes (Signed)
Pt called out bc monitor was too loud. Explained to pt that monitor was going off because his BP was high. Pt requested for entire monitor to be turned off because "if I hear it again, it's going to piss me off" explained to the pt that I could not turn off monitor as we were watching it.

## 2016-05-29 NOTE — Procedures (Signed)
Patient did well with HD, post-HD is stable.  OK for dc home.     I was present at this dialysis session, have reviewed the session itself and made  appropriate changes [name] MD [place] pager [phone]   05/29/2016, 3:58 PM

## 2016-05-29 NOTE — ED Notes (Signed)
Nephrology at bedside

## 2016-05-29 NOTE — ED Notes (Signed)
Pt transported to Xray. 

## 2016-05-29 NOTE — Progress Notes (Signed)
[facility] Progress Note   HPI: [name] is a 55 y.o. male with ESRD on hemodialysis MWF. ESRD secondary to HTN. His medical history is also significant for chronic Hepatitis B &C, polysubstance abuse (alcohol, tobacco, cocaine) , thrombocytopenia, and noncompliance with HD. He presented to the ED this morning from home with dyspnea, cough, abdominal pain. He completed 3 hours of HD yesterday 11/17 and left at 66.9kg almost at his EDW of 66.5kg. Post HD BP was 201/80. He typically signs off early on HD running about 3 hours every treatment. He reports SOB after leaving HD yesterday which got worse overnight. He was not able to eat anything last night d/t abdominal pain which he describes as a feeling of fullness.  On ED arrival Temp 97.67F RR 16 Pulse 76 SpO2 96% BP 217/100 K 3.9 WBC 5.3 Hgb 10  CXR today reveals diffuse interstitial edema. Seen at bedside c/o dyspnea, fatigue, coughing up phlegm. Denies fever, chills, chest pain , nausea/vomiting, diarrhea, He denies abdominal pain at present and is currently asking for food. He is still smoking about 1-2 cigarettes/day.      Objective Vitals:   05/29/16 0711 05/29/16 0715 05/29/16 0840 05/29/16 0900  BP: (!) 208/100 (!) 217/100 (!) 212/99 (!) 213/100  Pulse: 76 77  78  Resp: 16   (!) 27  Temp: 97.6 F (36.4 C)     TempSrc: Oral     SpO2: 94% 95%  97%  Weight: 70.3 kg (155 lb)     Height: 5\' 9"  (1.753 m)       Additional Objective Labs: Basic Metabolic Panel:  Recent Labs Lab 05/29/16 0755  NA 137  K 3.9  CL 93*  CO2 31  GLUCOSE 88  BUN 23*  CREATININE 6.02*  CALCIUM 10.1   Liver Function Tests:  Recent Labs Lab 05/29/16 0755  AST 46*  ALT 33  ALKPHOS 64  BILITOT 1.7*  PROT 7.7  ALBUMIN 3.8    Recent Labs Lab 05/29/16 0755  LIPASE 30   CBC:  Recent Labs Lab 05/29/16 0755  WBC 5.3  NEUTROABS 3.5  HGB 10.0*  HCT 29.9*  MCV 82.6  PLT 68*   Blood Culture No results found for:  SDES, SPECREQUEST, CULT, REPTSTATUS  Cardiac Enzymes: No results for input(s): CKTOTAL, CKMB, CKMBINDEX, TROPONINI in the last 168 hours. CBG: No results for input(s): GLUCAP in the last 168 hours. Iron Studies: No results for input(s): IRON, TIBC, TRANSFERRIN, FERRITIN in the last 72 hours. Lab Results  Component Value Date   INR 1.01 02/27/2014   INR 0.95 12/22/2010   Medications:  Physical Exam General: Alert Thin AAM some labored breathing Neck: supple +JVD to mandible  Heart: RRR Lungs:  bilat coarse rales throughout  Abdomen: soft NT/ND Extremities: no LE edema  Dialysis Access: LUE AVF with aneyrusms    Dialysis Orders:  [facility] MWF 4h 180F BFR 450/800 2K/2Ca EDW 66.5kg  LUE AVF Heparin 3000 U Bolus Venofer 100mg  IV 3x q HD x4 (last 11/15) Mircera 175mcg IV q 2 weeks (last 11/15) Calcitriol 2.0 mcg PO  q HD  Assessment/Plan: 1. Volume excess- poor HD compliance routine early signoffs - CXR with diffuse interstitial edema / reached EDW yesterday with dyspnea and ^BP - challenge EDW today and lower when discharged   2. ESRD - MWF - extra HD today  then resume OP schedule tomorrow?- OP center open 11/19 d/t holiday schedule  3. Anemia - Hgb stable  4. MBD -  On VDRA  5. HTN- BP elevated- should improve with HD today, cont home meds (lisinopril/amlodipine/hydralzine)   Plan: HD today- challenge EDW 3.5L   Joshua Larina Earthly PA-C [facility] Pager [phone] 05/29/2016,10:11 AM  LOS: 0 days   Pt seen, examined, agree w assess/plan as above with additions as indicated. ESRD pt with vol excess and SOB, early IS edema on CXR.  Plan HD today, if stable can go home after HD.  Has outpt HD scheduled for tomorrow as well given this is a holiday week.   [name] MD [facility] pager [phone]    cell [phone] 05/29/2016, 3:57 PM

## 2016-05-29 NOTE — ED Provider Notes (Signed)
Emergency Department Provider Note   I have reviewed the triage vital signs and the nursing notes.   HISTORY  Chief Complaint Abdominal Pain   HPI [name] is a 55 y.o. male with PMH of anxiety, ESRD on HD (MWF), and Hep B/C presents to the ED for evaluation of abdominal discomfort and distention. The patient had dialysis yesterday and feels like a "screwed up my dry weight" and didn't take enough fluid off. He has had similar discomfort in the past. He endorses some mild to moderate difficulty breathing. Denies chest pain. No fevers or chills. He has been compliant with his home medications. Pain made worse with pressing on the abdomen.   Past Medical History:  Diagnosis Date  . Adenomatous colon polyp    tubular  . Anemia   . Anxiety   . Chest pain    DATE UNKNOWN, C/O PERIODICALLY  . Chronic kidney disease    dialysis - M/W/F  . Dialysis patient ([place])    Monday-Wednesday-Friday  . GERD (gastroesophageal reflux disease)    DATE UNKNOWN  . Hemorrhoids   . Hepatitis B, chronic ([place])   . Hepatitis C   . Hyperkalemia   . Hypertension   . Metabolic bone disease    Patient denies  . Pneumonia   . Pulmonary edema   . Renal disorder   . Renal insufficiency   . Shortness of breath dyspnea    " for the last past year with this dialysis"    Patient Active Problem List   Diagnosis Date Noted  . End stage renal disease ([place]) 12/02/2015  . Chronic hepatitis B ([place]) 03/05/2014  . Chronic hepatitis C without hepatic coma ([place]) 03/05/2014  . Internal hemorrhoids with bleeding, swelling and itching 03/05/2014  . Thrombocytopenia, unspecified 03/05/2014  . Chest pain 02/27/2014  . ALCOHOL ABUSE 04/14/2009  . TOBACCO ABUSE 04/14/2009  . GANGLION CYST 04/14/2009    Past Surgical History:  Procedure Laterality Date  . AV FISTULA PLACEMENT  2012   BELIEVED WAS PLACED IN JUNE  . HEMORRHOID BANDING    . LIGATION OF ARTERIOVENOUS  FISTULA Left 01/15/7340   Procedure:  Plication of Left Arm Arteriovenous Fistula;  Surgeon: Elam Dutch, MD;  Location: [facility];  Service: Vascular;  Laterality: Left;  . REVISON OF ARTERIOVENOUS FISTULA Left 9/37/9024   Procedure: PLICATION OF DISTAL ANEURYSMAL SEGEMENT OF LEFT UPPER ARM ARTERIOVENOUS FISTULA;  Surgeon: Elam Dutch, MD;  Location: [facility];  Service: Vascular;  Laterality: Left;    Current Outpatient Rx  . Order #: [id number] Class: Historical Med  . Order #: [id number] Class: Historical Med  . Order #: [id number] Class: Historical Med  . Order #: [id number] Class: Historical Med  . Order #: [id number] Class: Historical Med  . Order #: [id number] Class: Historical Med  . Order #: [id number] Class: Historical Med  . Order #: [id number] Class: Historical Med  . Order #: [id number] Class: Print  . Order #: [id number] Class: Normal    Allergies Aspirin; Clonidine derivatives; and Tramadol  Family History  Problem Relation Age of Onset  . Heart disease Mother   . Lung cancer Mother   . Heart disease Father   . Malignant hyperthermia Father   . Throat cancer Sister   . Hypertension Other   . COPD Other   . Colon cancer Neg Hx     Social History Social History  Substance Use Topics  . Smoking status: Current Every Day Smoker    Packs/day: 0.75    Years: 37.00  Types: Cigarettes  . Smokeless tobacco: Never Used     Comment: 5 cigarettes per day.   . Alcohol use 2.4 oz/week    1 Glasses of wine, 1 Cans of beer, 1 Shots of liquor, 1 Standard drinks or equivalent per week     Comment: daily alcohol    Review of Systems  Constitutional: No fever/chills Eyes: No visual changes. ENT: No sore throat. Cardiovascular: Denies chest pain. Respiratory: Positive shortness of breath. Gastrointestinal: Positive abdominal pain.  No nausea, no vomiting.  No diarrhea.  No constipation. Genitourinary: Negative for dysuria. Musculoskeletal: Negative for back pain. Skin: Negative for rash. Neurological: Negative for  headaches, focal weakness or numbness.  10-point ROS otherwise negative.  ____________________________________________   PHYSICAL EXAM:  VITAL SIGNS: Temp: 97.6 F Pulse: 76 BP: 208/100 Resp: 16 SpO2: 96%  Constitutional: Alert and oriented. Well appearing and in no acute distress. Eyes: Conjunctivae are normal. Head: Atraumatic. Nose: No congestion/rhinnorhea. Mouth/Throat: Mucous membranes are moist.  Oropharynx non-erythematous. Neck: No stridor.  Cardiovascular: Normal rate, regular rhythm. Good peripheral circulation. Grossly normal heart sounds.   Respiratory: Normal respiratory effort.  No retractions. Lungs CTAB. Gastrointestinal: Soft with mild diffuse tenderness to palpation. Positive distension Musculoskeletal: No lower extremity tenderness nor edema. No gross deformities of extremities. Neurologic:  Normal speech and language. No gross focal neurologic deficits are appreciated.  Skin:  Skin is warm, dry and intact. No rash noted.  ____________________________________________   LABS (all labs ordered are listed, but only abnormal results are displayed)  Labs Reviewed  COMPREHENSIVE METABOLIC PANEL - Abnormal; Notable for the following:       Result Value   Chloride 93 (*)    BUN 23 (*)    Creatinine, Ser 6.02 (*)    AST 46 (*)    Total Bilirubin 1.7 (*)    GFR calc non Af Amer 10 (*)    GFR calc Af Amer 11 (*)    All other components within normal limits  CBC WITH DIFFERENTIAL/PLATELET - Abnormal; Notable for the following:    RBC 3.62 (*)    Hemoglobin 10.0 (*)    HCT 29.9 (*)    RDW 18.0 (*)    Platelets 68 (*)    All other components within normal limits  LIPASE, BLOOD   ____________________________________________  EKG   EKG Interpretation  Date/Time:  Saturday May 29 2016 07:17:33 EST Ventricular Rate:  76 PR Interval:    QRS Duration: 184 QT Interval:  487 QTC Calculation: 548 R Axis:   -81 Text Interpretation:  Sinus rhythm  Prolonged PR interval Left bundle branch block Wide QRS compared to prior. No STEMI.  Confirmed by Isa Hitz MD, Aleric Froelich [phone]) on 05/29/2016 7:23:16 AM Also confirmed by Ren Grasse MD, Orlondo [place]cross [id number]), editor [place], Joelene Millin [phone])  on 05/29/2016 10:26:24 AM       ____________________________________________  RADIOLOGY  Dg Chest 2 View  Result Date: 05/29/2016 CLINICAL DATA:  Shortness of Breath EXAM: CHEST  2 VIEW COMPARISON:  02/27/2014 FINDINGS: Cardiomegaly. Diffuse interstitial prominence throughout the lungs likely reflects early interstitial edema. No confluent opacities or effusions. No acute bony abnormality. IMPRESSION: Cardiomegaly with diffuse interstitial prominence throughout the lungs, likely interstitial edema. Electronically Signed   By: Rolm Baptise M.D.   On: 05/29/2016 07:43    ____________________________________________   PROCEDURES  Procedure(s) performed:   Procedures  None ____________________________________________   INITIAL IMPRESSION / ASSESSMENT AND PLAN / ED COURSE  Pertinent labs & imaging results that were available during my  care of the patient were reviewed by me and considered in my medical decision making (see chart for details).  Patient resents emergency department for evaluation of abdominal pain and distention in the setting of feeling like they do not pull and a fluid off during dialysis yesterday. His dialysis center is [facility]. He denies fever or chills. Does have some tachypnea. Plan for chest x-ray, EKG, labs, and reassess.   08:45 AM Patient with continued dyspnea and reassessment. No oxygen requirement. Labs are largely unremarkable. Chest x-ray shows diffuse interstitial edema which may be contributing to the patient's shortness of breath. Plan to discuss additional dialysis session today with nephrology.   09:00 AM Spoke with Dr. Jonnie Finner, Nephrology, regarding HD today. PA will be down for ED evaluation. Appreciate assistance with  case.   Patient transported to HD. Will be discharged from HD center. Discussed BP follow up with PCP and return precautions in detail.  ____________________________________________  FINAL CLINICAL IMPRESSION(S) / ED DIAGNOSES  Final diagnoses:  Dyspnea, unspecified type     MEDICATIONS GIVEN DURING THIS VISIT:  Medications  pentafluoroprop-tetrafluoroeth (GEBAUERS) aerosol 1 application (not administered)  lidocaine (PF) (XYLOCAINE) 1 % injection 5 mL (not administered)  lidocaine-prilocaine (EMLA) cream 1 application (not administered)  0.9 %  sodium chloride infusion (not administered)  0.9 %  sodium chloride infusion (not administered)  heparin injection 1,000 Units (not administered)  heparin injection 1,400 Units (not administered)  lisinopril (PRINIVIL,ZESTRIL) tablet 40 mg (40 mg Oral Given 05/29/16 0840)  amLODipine (NORVASC) tablet 10 mg (10 mg Oral Given 05/29/16 1100)     NEW OUTPATIENT MEDICATIONS STARTED DURING THIS VISIT:  None   Note:  This document was prepared using Dragon voice recognition software and may include unintentional dictation errors.  Nanda Quinton, MD Emergency Medicine   Margette Fast, MD 05/29/16 1900

## 2016-05-29 NOTE — ED Notes (Signed)
Pt ambulated to the restroom with standby assist.

## 2016-05-30 DIAGNOSIS — D631 Anemia in chronic kidney disease: Secondary | ICD-10-CM | POA: Diagnosis not present

## 2016-05-30 DIAGNOSIS — D509 Iron deficiency anemia, unspecified: Secondary | ICD-10-CM | POA: Diagnosis not present

## 2016-05-30 DIAGNOSIS — N186 End stage renal disease: Secondary | ICD-10-CM | POA: Diagnosis not present

## 2016-05-30 DIAGNOSIS — N2581 Secondary hyperparathyroidism of renal origin: Secondary | ICD-10-CM | POA: Diagnosis not present

## 2016-05-31 ENCOUNTER — Encounter: Payer: Self-pay | Admitting: Vascular Surgery

## 2016-05-31 NOTE — Progress Notes (Signed)
Pre-op instructions given only; please complete pt assessment on DOS. Pt denies having a cardiac cath but stated that a stress test was done " many years ago at [facility] ( 2010)."   Pt made aware to stop taking fish oil, vitamins, herbal medication and NSAID's such as Ibuprofen, Advil, Naproxen ( Aleve) BC and Goody Powder. Pt verbalized understanding of all pre-op instructions.

## 2016-06-01 ENCOUNTER — Encounter (HOSPITAL_COMMUNITY): Payer: Self-pay | Admitting: *Deleted

## 2016-06-01 ENCOUNTER — Ambulatory Visit (HOSPITAL_COMMUNITY)
Admission: RE | Admit: 2016-06-01 | Discharge: 2016-06-01 | Disposition: A | Discharge status: Home / Self Care | Payer: Medicare Other | Source: Ambulatory Visit | Attending: Vascular Surgery | Admitting: Vascular Surgery

## 2016-06-01 ENCOUNTER — Encounter: Payer: Self-pay | Admitting: Vascular Surgery

## 2016-06-01 ENCOUNTER — Encounter (HOSPITAL_COMMUNITY)
Admission: RE | Disposition: A | Discharge status: Home / Self Care | Payer: Self-pay | Source: Ambulatory Visit | Attending: Vascular Surgery

## 2016-06-01 ENCOUNTER — Encounter (HOSPITAL_COMMUNITY): Payer: Self-pay | Admitting: Anesthesiology

## 2016-06-01 DIAGNOSIS — Z539 Procedure and treatment not carried out, unspecified reason: Secondary | ICD-10-CM | POA: Insufficient documentation

## 2016-06-01 DIAGNOSIS — K219 Gastro-esophageal reflux disease without esophagitis: Secondary | ICD-10-CM | POA: Insufficient documentation

## 2016-06-01 DIAGNOSIS — I120 Hypertensive chronic kidney disease with stage 5 chronic kidney disease or end stage renal disease: Secondary | ICD-10-CM | POA: Insufficient documentation

## 2016-06-01 DIAGNOSIS — Z79899 Other long term (current) drug therapy: Secondary | ICD-10-CM | POA: Insufficient documentation

## 2016-06-01 DIAGNOSIS — F17210 Nicotine dependence, cigarettes, uncomplicated: Secondary | ICD-10-CM | POA: Diagnosis not present

## 2016-06-01 DIAGNOSIS — Z7982 Long term (current) use of aspirin: Secondary | ICD-10-CM | POA: Insufficient documentation

## 2016-06-01 DIAGNOSIS — Z992 Dependence on renal dialysis: Secondary | ICD-10-CM | POA: Insufficient documentation

## 2016-06-01 DIAGNOSIS — F1410 Cocaine abuse, uncomplicated: Secondary | ICD-10-CM | POA: Insufficient documentation

## 2016-06-01 DIAGNOSIS — N186 End stage renal disease: Secondary | ICD-10-CM | POA: Insufficient documentation

## 2016-06-01 LAB — POCT I-STAT 4, (NA,K, GLUC, HGB,HCT)
Glucose, Bld: 104 mg/dL — ABNORMAL HIGH (ref 65–99)
HCT: 32 % — ABNORMAL LOW (ref 39.0–52.0)
Hemoglobin: 10.9 g/dL — ABNORMAL LOW (ref 13.0–17.0)
Potassium: 3.6 mmol/L (ref 3.5–5.1)
Sodium: 135 mmol/L (ref 135–145)

## 2016-06-01 LAB — RAPID URINE DRUG SCREEN, HOSP PERFORMED
Amphetamines: NOT DETECTED
Barbiturates: NOT DETECTED
Benzodiazepines: NOT DETECTED
Cocaine: POSITIVE — AB
Opiates: NOT DETECTED
Tetrahydrocannabinol: NOT DETECTED

## 2016-06-01 SURGERY — ARTERIOVENOUS (AV) FISTULA CREATION
Anesthesia: Monitor Anesthesia Care | Site: Arm Lower | Laterality: Right

## 2016-06-01 MED ORDER — MIDAZOLAM HCL 2 MG/2ML IJ SOLN
INTRAMUSCULAR | Status: AC
  Filled 2016-06-01: qty 2

## 2016-06-01 MED ORDER — BISOPROLOL FUMARATE 10 MG PO TABS
10.0000 mg | ORAL_TABLET | Freq: Once | ORAL | Status: AC
  Administered 2016-06-01: 10 mg via ORAL
  Filled 2016-06-01: qty 1

## 2016-06-01 MED ORDER — DEXTROSE 5 % IV SOLN
1.5000 g | INTRAVENOUS | Status: DC
  Filled 2016-06-01: qty 1.5

## 2016-06-01 MED ORDER — LIDOCAINE 2% (20 MG/ML) 5 ML SYRINGE
INTRAMUSCULAR | Status: AC
  Filled 2016-06-01: qty 5

## 2016-06-01 MED ORDER — CHLORHEXIDINE GLUCONATE CLOTH 2 % EX PADS: 6.0000 | MEDICATED_PAD | Freq: Once | CUTANEOUS | Status: DC

## 2016-06-01 MED ORDER — SODIUM CHLORIDE 0.9 % IV SOLN
INTRAVENOUS | Status: DC
  Administered 2016-06-01: 12:00:00 via INTRAVENOUS

## 2016-06-01 MED ORDER — PROPOFOL 10 MG/ML IV BOLUS
INTRAVENOUS | Status: AC
  Filled 2016-06-01: qty 20

## 2016-06-01 MED ORDER — AMLODIPINE BESYLATE 10 MG PO TABS
10.0000 mg | ORAL_TABLET | Freq: Once | ORAL | Status: AC
  Administered 2016-06-01: 10 mg via ORAL
  Filled 2016-06-01: qty 1

## 2016-06-01 MED ORDER — FENTANYL CITRATE (PF) 100 MCG/2ML IJ SOLN
INTRAMUSCULAR | Status: AC
  Filled 2016-06-01: qty 2

## 2016-06-01 NOTE — Anesthesia Preprocedure Evaluation (Deleted)
Anesthesia Evaluation  Patient identified by MRN, date of birth, ID band Patient awake    Reviewed: Allergy & Precautions, NPO status , Patient's Chart, lab work & pertinent test results  Airway Mallampati: II  TM Distance: >3 FB Neck ROM: Full    Dental no notable dental hx. (+) Poor Dentition   Pulmonary Current Smoker,    Pulmonary exam normal breath sounds clear to auscultation       Cardiovascular hypertension, Pt. on medications negative cardio ROS Normal cardiovascular exam Rhythm:Regular Rate:Normal     Neuro/Psych negative neurological ROS  negative psych ROS   GI/Hepatic negative GI ROS, (+)     substance abuse  cocaine use, Hepatitis -, C, B  Endo/Other  negative endocrine ROS  Renal/GU ESRF and DialysisRenal disease  negative genitourinary   Musculoskeletal negative musculoskeletal ROS (+)   Abdominal   Peds negative pediatric ROS (+)  Hematology negative hematology ROS (+)   Anesthesia Other Findings   Reproductive/Obstetrics negative OB ROS                             Lab Results  Component Value Date   WBC 5.3 05/29/2016   HGB 10.0 (L) 05/29/2016   HCT 29.9 (L) 05/29/2016   MCV 82.6 05/29/2016   PLT 68 (L) 05/29/2016   Lab Results  Component Value Date   CREATININE 6.02 (H) 05/29/2016   BUN 23 (H) 05/29/2016   NA 137 05/29/2016   K 3.9 05/29/2016   CL 93 (L) 05/29/2016   CO2 31 05/29/2016    Anesthesia Physical  Anesthesia Plan  ASA: III  Anesthesia Plan:    Post-op Pain Management:    Induction:   Airway Management Planned:   Additional Equipment:   Intra-op Plan:   Post-operative Plan:   Informed Consent:   Plan Discussed with: CRNA  Anesthesia Plan Comments:         Anesthesia Quick Evaluation

## 2016-06-01 NOTE — Progress Notes (Signed)
Pt again tested positive for cocaine.  He was scheduled for a completely elective fistula.  He has a working access.  Pt procedure cancelled.  Ruta Hinds, MD [facility] Office: [phone] Pager: [phone]

## 2016-06-01 NOTE — Progress Notes (Signed)
Patient reports that he has follow- up instructions.

## 2016-06-04 DIAGNOSIS — D509 Iron deficiency anemia, unspecified: Secondary | ICD-10-CM | POA: Diagnosis not present

## 2016-06-04 DIAGNOSIS — D631 Anemia in chronic kidney disease: Secondary | ICD-10-CM | POA: Diagnosis not present

## 2016-06-04 DIAGNOSIS — N2581 Secondary hyperparathyroidism of renal origin: Secondary | ICD-10-CM | POA: Diagnosis not present

## 2016-06-04 DIAGNOSIS — N186 End stage renal disease: Secondary | ICD-10-CM | POA: Diagnosis not present

## 2016-06-07 DIAGNOSIS — N2581 Secondary hyperparathyroidism of renal origin: Secondary | ICD-10-CM | POA: Diagnosis not present

## 2016-06-07 DIAGNOSIS — N186 End stage renal disease: Secondary | ICD-10-CM | POA: Diagnosis not present

## 2016-06-07 DIAGNOSIS — D631 Anemia in chronic kidney disease: Secondary | ICD-10-CM | POA: Diagnosis not present

## 2016-06-07 DIAGNOSIS — D509 Iron deficiency anemia, unspecified: Secondary | ICD-10-CM | POA: Diagnosis not present

## 2016-06-08 DIAGNOSIS — D696 Thrombocytopenia, unspecified: Secondary | ICD-10-CM | POA: Diagnosis not present

## 2016-06-08 DIAGNOSIS — J45909 Unspecified asthma, uncomplicated: Secondary | ICD-10-CM | POA: Diagnosis not present

## 2016-06-08 DIAGNOSIS — B182 Chronic viral hepatitis C: Secondary | ICD-10-CM | POA: Diagnosis not present

## 2016-06-08 DIAGNOSIS — N186 End stage renal disease: Secondary | ICD-10-CM | POA: Diagnosis not present

## 2016-06-08 DIAGNOSIS — R739 Hyperglycemia, unspecified: Secondary | ICD-10-CM | POA: Diagnosis not present

## 2016-06-08 DIAGNOSIS — D638 Anemia in other chronic diseases classified elsewhere: Secondary | ICD-10-CM | POA: Diagnosis not present

## 2016-06-08 DIAGNOSIS — I10 Essential (primary) hypertension: Secondary | ICD-10-CM | POA: Diagnosis not present

## 2016-06-08 DIAGNOSIS — I429 Cardiomyopathy, unspecified: Secondary | ICD-10-CM | POA: Diagnosis not present

## 2016-06-08 DIAGNOSIS — B1910 Unspecified viral hepatitis B without hepatic coma: Secondary | ICD-10-CM | POA: Diagnosis not present

## 2016-06-09 DIAGNOSIS — N186 End stage renal disease: Secondary | ICD-10-CM | POA: Diagnosis not present

## 2016-06-09 DIAGNOSIS — D509 Iron deficiency anemia, unspecified: Secondary | ICD-10-CM | POA: Diagnosis not present

## 2016-06-09 DIAGNOSIS — D631 Anemia in chronic kidney disease: Secondary | ICD-10-CM | POA: Diagnosis not present

## 2016-06-09 DIAGNOSIS — N2581 Secondary hyperparathyroidism of renal origin: Secondary | ICD-10-CM | POA: Diagnosis not present

## 2016-06-10 DIAGNOSIS — N186 End stage renal disease: Secondary | ICD-10-CM | POA: Diagnosis not present

## 2016-06-10 DIAGNOSIS — Z992 Dependence on renal dialysis: Secondary | ICD-10-CM | POA: Diagnosis not present

## 2016-06-10 DIAGNOSIS — I120 Hypertensive chronic kidney disease with stage 5 chronic kidney disease or end stage renal disease: Secondary | ICD-10-CM | POA: Diagnosis not present

## 2016-06-11 DIAGNOSIS — N2581 Secondary hyperparathyroidism of renal origin: Secondary | ICD-10-CM | POA: Diagnosis not present

## 2016-06-11 DIAGNOSIS — D631 Anemia in chronic kidney disease: Secondary | ICD-10-CM | POA: Diagnosis not present

## 2016-06-11 DIAGNOSIS — N186 End stage renal disease: Secondary | ICD-10-CM | POA: Diagnosis not present

## 2016-06-11 DIAGNOSIS — D509 Iron deficiency anemia, unspecified: Secondary | ICD-10-CM | POA: Diagnosis not present

## 2016-06-14 DIAGNOSIS — N2581 Secondary hyperparathyroidism of renal origin: Secondary | ICD-10-CM | POA: Diagnosis not present

## 2016-06-14 DIAGNOSIS — D631 Anemia in chronic kidney disease: Secondary | ICD-10-CM | POA: Diagnosis not present

## 2016-06-14 DIAGNOSIS — D509 Iron deficiency anemia, unspecified: Secondary | ICD-10-CM | POA: Diagnosis not present

## 2016-06-14 DIAGNOSIS — N186 End stage renal disease: Secondary | ICD-10-CM | POA: Diagnosis not present

## 2016-06-16 DIAGNOSIS — N2581 Secondary hyperparathyroidism of renal origin: Secondary | ICD-10-CM | POA: Diagnosis not present

## 2016-06-16 DIAGNOSIS — D631 Anemia in chronic kidney disease: Secondary | ICD-10-CM | POA: Diagnosis not present

## 2016-06-16 DIAGNOSIS — N186 End stage renal disease: Secondary | ICD-10-CM | POA: Diagnosis not present

## 2016-06-16 DIAGNOSIS — D509 Iron deficiency anemia, unspecified: Secondary | ICD-10-CM | POA: Diagnosis not present

## 2016-06-18 DIAGNOSIS — N186 End stage renal disease: Secondary | ICD-10-CM | POA: Diagnosis not present

## 2016-06-18 DIAGNOSIS — N2581 Secondary hyperparathyroidism of renal origin: Secondary | ICD-10-CM | POA: Diagnosis not present

## 2016-06-18 DIAGNOSIS — D509 Iron deficiency anemia, unspecified: Secondary | ICD-10-CM | POA: Diagnosis not present

## 2016-06-18 DIAGNOSIS — D631 Anemia in chronic kidney disease: Secondary | ICD-10-CM | POA: Diagnosis not present

## 2016-06-21 DIAGNOSIS — N186 End stage renal disease: Secondary | ICD-10-CM | POA: Diagnosis not present

## 2016-06-21 DIAGNOSIS — N2581 Secondary hyperparathyroidism of renal origin: Secondary | ICD-10-CM | POA: Diagnosis not present

## 2016-06-21 DIAGNOSIS — D509 Iron deficiency anemia, unspecified: Secondary | ICD-10-CM | POA: Diagnosis not present

## 2016-06-21 DIAGNOSIS — D631 Anemia in chronic kidney disease: Secondary | ICD-10-CM | POA: Diagnosis not present

## 2016-06-23 DIAGNOSIS — N2581 Secondary hyperparathyroidism of renal origin: Secondary | ICD-10-CM | POA: Diagnosis not present

## 2016-06-23 DIAGNOSIS — D509 Iron deficiency anemia, unspecified: Secondary | ICD-10-CM | POA: Diagnosis not present

## 2016-06-23 DIAGNOSIS — D631 Anemia in chronic kidney disease: Secondary | ICD-10-CM | POA: Diagnosis not present

## 2016-06-23 DIAGNOSIS — N186 End stage renal disease: Secondary | ICD-10-CM | POA: Diagnosis not present

## 2016-06-24 ENCOUNTER — Emergency Department (HOSPITAL_COMMUNITY): Payer: Medicare Other

## 2016-06-24 ENCOUNTER — Emergency Department (HOSPITAL_COMMUNITY)
Admission: EM | Admit: 2016-06-24 | Discharge: 2016-06-24 | Disposition: A | Discharge status: Home / Self Care | Payer: Medicare Other | Attending: Emergency Medicine | Admitting: Emergency Medicine

## 2016-06-24 ENCOUNTER — Encounter (HOSPITAL_COMMUNITY): Payer: Self-pay | Admitting: Emergency Medicine

## 2016-06-24 DIAGNOSIS — Z7982 Long term (current) use of aspirin: Secondary | ICD-10-CM | POA: Insufficient documentation

## 2016-06-24 DIAGNOSIS — I120 Hypertensive chronic kidney disease with stage 5 chronic kidney disease or end stage renal disease: Secondary | ICD-10-CM | POA: Insufficient documentation

## 2016-06-24 DIAGNOSIS — Z992 Dependence on renal dialysis: Secondary | ICD-10-CM | POA: Insufficient documentation

## 2016-06-24 DIAGNOSIS — I509 Heart failure, unspecified: Secondary | ICD-10-CM | POA: Diagnosis not present

## 2016-06-24 DIAGNOSIS — Z79899 Other long term (current) drug therapy: Secondary | ICD-10-CM | POA: Insufficient documentation

## 2016-06-24 DIAGNOSIS — F17210 Nicotine dependence, cigarettes, uncomplicated: Secondary | ICD-10-CM | POA: Diagnosis not present

## 2016-06-24 DIAGNOSIS — R0602 Shortness of breath: Secondary | ICD-10-CM | POA: Insufficient documentation

## 2016-06-24 DIAGNOSIS — N186 End stage renal disease: Secondary | ICD-10-CM | POA: Diagnosis not present

## 2016-06-24 LAB — COMPREHENSIVE METABOLIC PANEL
ALT: 41 U/L (ref 17–63)
AST: 78 U/L — ABNORMAL HIGH (ref 15–41)
Albumin: 3.6 g/dL (ref 3.5–5.0)
Alkaline Phosphatase: 64 U/L (ref 38–126)
Anion gap: 12 (ref 5–15)
BUN: 21 mg/dL — ABNORMAL HIGH (ref 6–20)
CO2: 29 mmol/L (ref 22–32)
Calcium: 9.9 mg/dL (ref 8.9–10.3)
Chloride: 94 mmol/L — ABNORMAL LOW (ref 101–111)
Creatinine, Ser: 6.49 mg/dL — ABNORMAL HIGH (ref 0.61–1.24)
GFR calc Af Amer: 10 mL/min — ABNORMAL LOW (ref >60)
GFR calc non Af Amer: 9 mL/min — ABNORMAL LOW (ref >60)
Glucose, Bld: 100 mg/dL — ABNORMAL HIGH (ref 65–99)
Potassium: 5.2 mmol/L — ABNORMAL HIGH (ref 3.5–5.1)
Sodium: 135 mmol/L (ref 135–145)
Total Bilirubin: 3 mg/dL — ABNORMAL HIGH (ref 0.3–1.2)
Total Protein: 7.6 g/dL (ref 6.5–8.1)

## 2016-06-24 LAB — CBC WITH DIFFERENTIAL/PLATELET
Basophils Absolute: 0 10*3/uL (ref 0.0–0.1)
Basophils Relative: 1 %
Eosinophils Absolute: 0 10*3/uL (ref 0.0–0.7)
Eosinophils Relative: 1 %
HCT: 29.9 % — ABNORMAL LOW (ref 39.0–52.0)
Hemoglobin: 10.1 g/dL — ABNORMAL LOW (ref 13.0–17.0)
Lymphocytes Relative: 28 %
Lymphs Abs: 1.4 10*3/uL (ref 0.7–4.0)
MCH: 29.5 pg (ref 26.0–34.0)
MCHC: 33.8 g/dL (ref 30.0–36.0)
MCV: 87.4 fL (ref 78.0–100.0)
Monocytes Absolute: 0.3 10*3/uL (ref 0.1–1.0)
Monocytes Relative: 5 %
Neutro Abs: 3.2 10*3/uL (ref 1.7–7.7)
Neutrophils Relative %: 65 %
Platelets: 55 10*3/uL — ABNORMAL LOW (ref 150–400)
RBC: 3.42 MIL/uL — ABNORMAL LOW (ref 4.22–5.81)
RDW: 20.7 % — ABNORMAL HIGH (ref 11.5–15.5)
WBC: 4.9 10*3/uL (ref 4.0–10.5)

## 2016-06-24 LAB — I-STAT TROPONIN, ED: Troponin i, poc: 0.04 ng/mL (ref 0.00–0.08)

## 2016-06-24 MED ORDER — ALBUTEROL SULFATE (2.5 MG/3ML) 0.083% IN NEBU
5.0000 mg | INHALATION_SOLUTION | Freq: Once | RESPIRATORY_TRACT | Status: AC
  Administered 2016-06-24: 5 mg via RESPIRATORY_TRACT
  Filled 2016-06-24: qty 6

## 2016-06-24 MED ORDER — METHYLPREDNISOLONE SODIUM SUCC 125 MG IJ SOLR
125.0000 mg | Freq: Once | INTRAMUSCULAR | Status: AC
  Administered 2016-06-24: 125 mg via INTRAVENOUS
  Filled 2016-06-24: qty 2

## 2016-06-24 MED ORDER — PREDNISONE 50 MG PO TABS: ORAL_TABLET | ORAL | 0 refills | Status: DC

## 2016-06-24 NOTE — ED Notes (Signed)
Will check pt's temp after resp treatment

## 2016-06-24 NOTE — Discharge Instructions (Signed)
Please try not to smoke.  Is very important he presented for dialysis and stay for your full session tomorrow.  Use your albuterol inhaler every 4-6 hours.  Please follow with your primary care doctor in the next 2 days for a check-up. They must obtain records for further management.   Do not hesitate to return to the Emergency Department for any new, worsening or concerning symptoms.

## 2016-06-24 NOTE — ED Triage Notes (Signed)
Pt to ED via [facility] from home, with c/o shortness of breath. Pt is dialysis pt MWF-- went yesterday for 3 hours.  Pt eating a fish sandwich from [employer] on arrival, sitting on edge of bed -- position of comfort. Pt is cooperative,

## 2016-06-24 NOTE — ED Provider Notes (Signed)
[facility] DEPT Provider Note   CSN: [id number] Arrival date & time: 06/24/16  1252     History   Chief Complaint Chief Complaint  Patient presents with  . Shortness of Breath     HPI   Blood pressure (!) 194/108, pulse 99, resp. rate 24, height 5\' 9"  (1.753 m), SpO2 95 %.  [name] is a 55 y.o. male complaining of PMH of anxiety, ESRD on HD (MWF), and Hep B/C presents to the ED BIBEMS for SOB, DOE worsening X2 weeks. Was dialyzed for 3 out of the normal 4 hours yesterday he self DC'd it because of pain and fistula site, no active bleeding. He denies chest pain, fevers, chills, nausea, vomiting, increasing peripheral edema. Endorses suprapubic abdominal pain he does make urine. Cannot tell me if as orthopnea or PND. He follows with cardiology for difficult to control blood pressure does not have history of ACS, intermittent tobacco use. Chart review shows that this patient was scheduled for fistula creation on November 21, procedure was canceled secondary to positive UDS with cocaine. Patient states he has not used any cocaine or any drugs at all in over a month.  Past Medical History:  Diagnosis Date  . Adenomatous colon polyp    tubular  . Anemia   . Anxiety   . Chest pain    DATE UNKNOWN, C/O PERIODICALLY  . Chronic kidney disease    dialysis - M/W/F  . Dialysis patient ([place])    Monday-Wednesday-Friday  . GERD (gastroesophageal reflux disease)    DATE UNKNOWN  . Hemorrhoids   . Hepatitis B, chronic ([place])   . Hepatitis C   . Hyperkalemia   . Hypertension   . Metabolic bone disease    Patient denies  . Pneumonia   . Pulmonary edema   . Renal disorder   . Renal insufficiency   . Shortness of breath dyspnea    " for the last past year with this dialysis"    Patient Active Problem List   Diagnosis Date Noted  . End stage renal disease ([place]) 12/02/2015  . Chronic hepatitis B ([place]) 03/05/2014  . Chronic hepatitis C without hepatic coma ([place]) 03/05/2014    . Internal hemorrhoids with bleeding, swelling and itching 03/05/2014  . Thrombocytopenia, unspecified 03/05/2014  . Chest pain 02/27/2014  . ALCOHOL ABUSE 04/14/2009  . TOBACCO ABUSE 04/14/2009  . GANGLION CYST 04/14/2009    Past Surgical History:  Procedure Laterality Date  . AV FISTULA PLACEMENT  2012   BELIEVED WAS PLACED IN JUNE  . HEMORRHOID BANDING    . LIGATION OF ARTERIOVENOUS  FISTULA Left 08/15/2681   Procedure: Plication of Left Arm Arteriovenous Fistula;  Surgeon: Elam Dutch, MD;  Location: [facility];  Service: Vascular;  Laterality: Left;  . REVISON OF ARTERIOVENOUS FISTULA Left 10/28/6220   Procedure: PLICATION OF DISTAL ANEURYSMAL SEGEMENT OF LEFT UPPER ARM ARTERIOVENOUS FISTULA;  Surgeon: Elam Dutch, MD;  Location: [facility];  Service: Vascular;  Laterality: Left;       Home Medications    Prior to Admission medications   Medication Sig Start Date End Date Taking? Authorizing Provider  amLODipine (NORVASC) 10 MG tablet Take 10 mg by mouth at bedtime.    Yes Historical Provider, MD  aspirin EC 81 MG tablet Take 81 mg by mouth daily.   Yes Historical Provider, MD  bisoprolol (ZEBETA) 10 MG tablet Take 10 mg by mouth daily.   Yes Historical Provider, MD  cinacalcet (SENSIPAR) 30  MG tablet Take 30 mg by mouth daily.   Yes Historical Provider, MD  DULERA 100-5 MCG/ACT AERO Inhale 2 puffs into the lungs 2 (two) times daily as needed for wheezing or shortness of breath.  09/22/15  Yes Historical Provider, MD  lisinopril (PRINIVIL,ZESTRIL) 20 MG tablet Take 40 mg by mouth daily.   Yes Historical Provider, MD  oxyCODONE-acetaminophen (PERCOCET/ROXICET) 5-325 MG tablet Take 1 tablet by mouth every 6 (six) hours as needed for moderate pain. 01/20/16  Yes Elam Dutch, MD  polyethylene glycol powder (GLYCOLAX/MIRALAX) powder MIX 17 GM WITH 8 OUNCES OF LIQUID AND TAKE BY MOUTH DAILY 03/22/16  Yes Gatha Mayer, MD  sevelamer carbonate (RENVELA) 800 MG tablet Take  1,600-3,200 mg by mouth See admin instructions. Take 3200 mg by mouth 3 times daily with meals and take 1600 mg by mouth with snacks.   Yes Historical Provider, MD  traZODone (DESYREL) 50 MG tablet Take 50-100 mg by mouth at bedtime as needed for sleep.   Yes Historical Provider, MD  VENTOLIN HFA 108 (90 Base) MCG/ACT inhaler Inhale 2 puffs into the lungs every 6 (six) hours as needed for wheezing or shortness of breath.  01/03/16  Yes Historical Provider, MD  predniSONE (DELTASONE) 50 MG tablet Take 1 tablet daily with breakfast 06/24/16   Monico Blitz, PA-C    Family History Family History  Problem Relation Age of Onset  . Heart disease Mother   . Lung cancer Mother   . Heart disease Father   . Malignant hyperthermia Father   . Throat cancer Sister   . Hypertension Other   . COPD Other   . Colon cancer Neg Hx     Social History Social History  Substance Use Topics  . Smoking status: Current Every Day Smoker    Packs/day: 0.04    Years: 37.00    Types: Cigarettes  . Smokeless tobacco: Never Used     Comment: 5 cigarettes per day.   . Alcohol use 2.4 oz/week    1 Glasses of wine, 1 Cans of beer, 1 Shots of liquor, 1 Standard drinks or equivalent per week     Comment: quit     Allergies   Aspirin; Clonidine derivatives; and Tramadol   Review of Systems Review of Systems  10 systems reviewed and found to be negative, except as noted in the HPI.   Physical Exam Updated Vital Signs BP (!) 186/106 (BP Location: Right Arm)   Pulse 84   Temp 97.8 F (36.6 C) (Oral)   Resp 21   Ht 5\' 9"  (1.753 m)   SpO2 100%   Physical Exam  Constitutional: He is oriented to person, place, and time. He appears well-developed and well-nourished. No distress.  Eating [employer]  HENT:  Head: Normocephalic and atraumatic.  Mouth/Throat: Oropharynx is clear and moist.  Eyes: Conjunctivae and EOM are normal. Pupils are equal, round, and reactive to light.  Neck: Normal range of  motion. No JVD present.  Cardiovascular: Normal rate, regular rhythm and intact distal pulses.   Fistula to left antecubital area with good thrill  Pulmonary/Chest: Effort normal. He has wheezes.  [place] reduced air movement at the bases, trace expiratory wheezing, patient resting comfortably, speaking in full sentences no accessory muscle use  Abdominal: Soft. He exhibits no distension and no mass. There is no tenderness. There is no rebound and no guarding. No hernia.  Musculoskeletal: Normal range of motion.  Neurological: He is alert and oriented to person, place,  and time.  Skin: He is not diaphoretic.  Psychiatric: He has a normal mood and affect.  Nursing note and vitals reviewed.    ED Treatments / Results  Labs (all labs ordered are listed, but only abnormal results are displayed) Labs Reviewed  COMPREHENSIVE METABOLIC PANEL - Abnormal; Notable for the following:       Result Value   Potassium 5.2 (*)    Chloride 94 (*)    Glucose, Bld 100 (*)    BUN 21 (*)    Creatinine, Ser 6.49 (*)    AST 78 (*)    Total Bilirubin 3.0 (*)    GFR calc non Af Amer 9 (*)    GFR calc Af Amer 10 (*)    All other components within normal limits  CBC WITH DIFFERENTIAL/PLATELET - Abnormal; Notable for the following:    RBC 3.42 (*)    Hemoglobin 10.1 (*)    HCT 29.9 (*)    RDW 20.7 (*)    Platelets 55 (*)    All other components within normal limits  URINALYSIS, ROUTINE W REFLEX MICROSCOPIC  RAPID URINE DRUG SCREEN, HOSP PERFORMED  I-STAT TROPOININ, ED    EKG  EKG Interpretation  Date/Time:  Thursday June 24 2016 12:52:59 EST Ventricular Rate:  97 PR Interval:    QRS Duration: 178 QT Interval:  441 QTC Calculation: 561 R Axis:   -79 Text Interpretation:  Sinus rhythm Left bundle branch block No significant change since last tracing Confirmed by KNOTT MD, DANIEL [id number] on 06/24/2016 1:23:44 PM       Radiology Dg Chest 2 View  Result Date: 06/24/2016 CLINICAL  DATA:  Illness of breath, weakness post dialysis yesterday. History of hepatitis-B, current smoker. EXAM: CHEST  2 VIEW COMPARISON:  PA and lateral chest x-ray of May 29, 2016 FINDINGS: The lungs are well-expanded. The interstitial markings are increased. The cardiac silhouette is enlarged and the pulmonary vascularity is engorged. There is calcification in the wall of the aortic arch. There is no pleural effusion. The mediastinum is normal in width. The bony thorax exhibits no acute abnormality. IMPRESSION: CHF with mild interstitial edema superimposed upon chronic bronchitic changes. No alveolar pneumonia. The appearance of the chest has not significantly changed since that of May 29, 2016. Thoracic aortic atherosclerosis. Electronically Signed   By: David  Martinique M.D.   On: 06/24/2016 14:32    Procedures Procedures (including critical care time)  Medications Ordered in ED Medications  albuterol (PROVENTIL) (2.5 MG/3ML) 0.083% nebulizer solution 5 mg (5 mg Nebulization Given 06/24/16 1359)  methylPREDNISolone sodium succinate (SOLU-MEDROL) 125 mg/2 mL injection 125 mg (125 mg Intravenous Given 06/24/16 1401)     Initial Impression / Assessment and Plan / ED Course  I have reviewed the triage vital signs and the nursing notes.  Pertinent labs & imaging results that were available during my care of the patient were reviewed by me and considered in my medical decision making (see chart for details).  Clinical Course    Vitals:   06/24/16 1300 06/24/16 1301 06/24/16 1347 06/24/16 1502  BP: (!) 194/108  (!) 186/106   Pulse: 99  84   Resp: 24  21   Temp:    97.8 F (36.6 C)  TempSrc:   Oral Oral  SpO2: 98% 95% 100%   Height:  5\' 9"  (1.753 m)      Medications  albuterol (PROVENTIL) (2.5 MG/3ML) 0.083% nebulizer solution 5 mg (5 mg Nebulization Given 06/24/16 1359)  methylPREDNISolone  sodium succinate (SOLU-MEDROL) 125 mg/2 mL injection 125 mg (125 mg Intravenous Given 06/24/16  1401)    [name] is 55 y.o. male presenting with [place] of breath and dyspnea on exertion worsening over the course of the last 2 weeks. Patient with wheezing, no respiratory distress. Denies chest pain. EKG sinus rhythm, left bundle, unchanged from prior. Patient not clinically volume overloaded. Will perform basic cardiac workup and give nebulizer treatment Solu-Medrol.  No improvement after Solu-Medrol and nebulizer treatment.  Chest x-ray with mild CHF on top of bronchitic changes, likely multifactorial shortness of breath. Troponin negative, Pro BNP is not ordered secondary to being a dialysis patient.  Discussed with attending: no indication for emergent dialysis if chemistries are normal. He really needs to be dialyzed fully, I counseled patient that he will have to go for dialysis tomorrow and stay for the full session. He had an extensive discussion on smoking cessation. States that he is not using cocaine, patient is referred to cardiology for provocative tori testing.   Evaluation does not show pathology that would require ongoing emergent intervention or inpatient treatment. Pt is hemodynamically stable and mentating appropriately. Discussed findings and plan with patient/guardian, who agrees with care plan. All questions answered. Return precautions discussed and outpatient follow up given.   Final Clinical Impressions(s) / ED Diagnoses   Final diagnoses:  SOB (shortness of breath)    New Prescriptions New Prescriptions   PREDNISONE (DELTASONE) 50 MG TABLET    Take 1 tablet daily with breakfast     Monico Blitz, PA-C 06/24/16 1535    Leo Grosser, MD 06/25/16 [phone]

## 2016-06-24 NOTE — ED Notes (Signed)
EDP notified of patient's BP prior to D/C. EDP cleared pt for D/C.

## 2016-06-25 DIAGNOSIS — D509 Iron deficiency anemia, unspecified: Secondary | ICD-10-CM | POA: Diagnosis not present

## 2016-06-25 DIAGNOSIS — N186 End stage renal disease: Secondary | ICD-10-CM | POA: Diagnosis not present

## 2016-06-25 DIAGNOSIS — N2581 Secondary hyperparathyroidism of renal origin: Secondary | ICD-10-CM | POA: Diagnosis not present

## 2016-06-25 DIAGNOSIS — D631 Anemia in chronic kidney disease: Secondary | ICD-10-CM | POA: Diagnosis not present

## 2016-06-28 DIAGNOSIS — N186 End stage renal disease: Secondary | ICD-10-CM | POA: Diagnosis not present

## 2016-06-28 DIAGNOSIS — D631 Anemia in chronic kidney disease: Secondary | ICD-10-CM | POA: Diagnosis not present

## 2016-06-28 DIAGNOSIS — D509 Iron deficiency anemia, unspecified: Secondary | ICD-10-CM | POA: Diagnosis not present

## 2016-06-28 DIAGNOSIS — N2581 Secondary hyperparathyroidism of renal origin: Secondary | ICD-10-CM | POA: Diagnosis not present

## 2016-06-30 DIAGNOSIS — D509 Iron deficiency anemia, unspecified: Secondary | ICD-10-CM | POA: Diagnosis not present

## 2016-06-30 DIAGNOSIS — N186 End stage renal disease: Secondary | ICD-10-CM | POA: Diagnosis not present

## 2016-06-30 DIAGNOSIS — D631 Anemia in chronic kidney disease: Secondary | ICD-10-CM | POA: Diagnosis not present

## 2016-06-30 DIAGNOSIS — N2581 Secondary hyperparathyroidism of renal origin: Secondary | ICD-10-CM | POA: Diagnosis not present

## 2016-07-02 DIAGNOSIS — N2581 Secondary hyperparathyroidism of renal origin: Secondary | ICD-10-CM | POA: Diagnosis not present

## 2016-07-02 DIAGNOSIS — N186 End stage renal disease: Secondary | ICD-10-CM | POA: Diagnosis not present

## 2016-07-02 DIAGNOSIS — D509 Iron deficiency anemia, unspecified: Secondary | ICD-10-CM | POA: Diagnosis not present

## 2016-07-02 DIAGNOSIS — D631 Anemia in chronic kidney disease: Secondary | ICD-10-CM | POA: Diagnosis not present

## 2016-07-04 DIAGNOSIS — D509 Iron deficiency anemia, unspecified: Secondary | ICD-10-CM | POA: Diagnosis not present

## 2016-07-04 DIAGNOSIS — N186 End stage renal disease: Secondary | ICD-10-CM | POA: Diagnosis not present

## 2016-07-04 DIAGNOSIS — N2581 Secondary hyperparathyroidism of renal origin: Secondary | ICD-10-CM | POA: Diagnosis not present

## 2016-07-04 DIAGNOSIS — D631 Anemia in chronic kidney disease: Secondary | ICD-10-CM | POA: Diagnosis not present

## 2016-07-07 DIAGNOSIS — D509 Iron deficiency anemia, unspecified: Secondary | ICD-10-CM | POA: Diagnosis not present

## 2016-07-07 DIAGNOSIS — D631 Anemia in chronic kidney disease: Secondary | ICD-10-CM | POA: Diagnosis not present

## 2016-07-07 DIAGNOSIS — N2581 Secondary hyperparathyroidism of renal origin: Secondary | ICD-10-CM | POA: Diagnosis not present

## 2016-07-07 DIAGNOSIS — N186 End stage renal disease: Secondary | ICD-10-CM | POA: Diagnosis not present

## 2016-07-09 DIAGNOSIS — D509 Iron deficiency anemia, unspecified: Secondary | ICD-10-CM | POA: Diagnosis not present

## 2016-07-09 DIAGNOSIS — N186 End stage renal disease: Secondary | ICD-10-CM | POA: Diagnosis not present

## 2016-07-09 DIAGNOSIS — D631 Anemia in chronic kidney disease: Secondary | ICD-10-CM | POA: Diagnosis not present

## 2016-07-09 DIAGNOSIS — N2581 Secondary hyperparathyroidism of renal origin: Secondary | ICD-10-CM | POA: Diagnosis not present

## 2016-07-11 ENCOUNTER — Emergency Department (HOSPITAL_COMMUNITY)
Admission: EM | Admit: 2016-07-11 | Discharge: 2016-07-11 | Disposition: A | Discharge status: Home / Self Care | Payer: Medicare Other | Attending: Emergency Medicine | Admitting: Emergency Medicine

## 2016-07-11 ENCOUNTER — Encounter (HOSPITAL_COMMUNITY): Payer: Self-pay | Admitting: Emergency Medicine

## 2016-07-11 ENCOUNTER — Emergency Department (HOSPITAL_COMMUNITY): Payer: Medicare Other

## 2016-07-11 DIAGNOSIS — Z79899 Other long term (current) drug therapy: Secondary | ICD-10-CM | POA: Diagnosis not present

## 2016-07-11 DIAGNOSIS — F17210 Nicotine dependence, cigarettes, uncomplicated: Secondary | ICD-10-CM | POA: Diagnosis not present

## 2016-07-11 DIAGNOSIS — I120 Hypertensive chronic kidney disease with stage 5 chronic kidney disease or end stage renal disease: Secondary | ICD-10-CM | POA: Insufficient documentation

## 2016-07-11 DIAGNOSIS — N186 End stage renal disease: Secondary | ICD-10-CM | POA: Insufficient documentation

## 2016-07-11 DIAGNOSIS — Z7982 Long term (current) use of aspirin: Secondary | ICD-10-CM | POA: Insufficient documentation

## 2016-07-11 DIAGNOSIS — R197 Diarrhea, unspecified: Secondary | ICD-10-CM | POA: Diagnosis not present

## 2016-07-11 DIAGNOSIS — R1033 Periumbilical pain: Secondary | ICD-10-CM | POA: Diagnosis not present

## 2016-07-11 DIAGNOSIS — K2970 Gastritis, unspecified, without bleeding: Secondary | ICD-10-CM | POA: Diagnosis not present

## 2016-07-11 DIAGNOSIS — Z992 Dependence on renal dialysis: Secondary | ICD-10-CM | POA: Insufficient documentation

## 2016-07-11 DIAGNOSIS — R0602 Shortness of breath: Secondary | ICD-10-CM | POA: Insufficient documentation

## 2016-07-11 DIAGNOSIS — R109 Unspecified abdominal pain: Secondary | ICD-10-CM | POA: Diagnosis not present

## 2016-07-11 LAB — CBC
HCT: 29.9 % — ABNORMAL LOW (ref 39.0–52.0)
Hemoglobin: 10.4 g/dL — ABNORMAL LOW (ref 13.0–17.0)
MCH: 28.7 pg (ref 26.0–34.0)
MCHC: 34.8 g/dL (ref 30.0–36.0)
MCV: 82.4 fL (ref 78.0–100.0)
Platelets: 52 10*3/uL — ABNORMAL LOW (ref 150–400)
RBC: 3.63 MIL/uL — ABNORMAL LOW (ref 4.22–5.81)
RDW: 19.3 % — ABNORMAL HIGH (ref 11.5–15.5)
WBC: 7.4 10*3/uL (ref 4.0–10.5)

## 2016-07-11 LAB — I-STAT CHEM 8, ED
BUN: 62 mg/dL — ABNORMAL HIGH (ref 6–20)
Calcium, Ion: 1.21 mmol/L (ref 1.15–1.40)
Chloride: 94 mmol/L — ABNORMAL LOW (ref 101–111)
Creatinine, Ser: 8.8 mg/dL — ABNORMAL HIGH (ref 0.61–1.24)
Glucose, Bld: 92 mg/dL (ref 65–99)
HCT: 35 % — ABNORMAL LOW (ref 39.0–52.0)
Hemoglobin: 11.9 g/dL — ABNORMAL LOW (ref 13.0–17.0)
Potassium: 4.5 mmol/L (ref 3.5–5.1)
Sodium: 131 mmol/L — ABNORMAL LOW (ref 135–145)
TCO2: 26 mmol/L (ref 0–100)

## 2016-07-11 LAB — COMPREHENSIVE METABOLIC PANEL
ALT: 52 U/L (ref 17–63)
AST: 68 U/L — ABNORMAL HIGH (ref 15–41)
Albumin: 3.3 g/dL — ABNORMAL LOW (ref 3.5–5.0)
Alkaline Phosphatase: 87 U/L (ref 38–126)
Anion gap: 12 (ref 5–15)
BUN: 66 mg/dL — ABNORMAL HIGH (ref 6–20)
CO2: 27 mmol/L (ref 22–32)
Calcium: 10 mg/dL (ref 8.9–10.3)
Chloride: 92 mmol/L — ABNORMAL LOW (ref 101–111)
Creatinine, Ser: 8.66 mg/dL — ABNORMAL HIGH (ref 0.61–1.24)
GFR calc Af Amer: 7 mL/min — ABNORMAL LOW (ref >60)
GFR calc non Af Amer: 6 mL/min — ABNORMAL LOW (ref >60)
Glucose, Bld: 89 mg/dL (ref 65–99)
Potassium: 4.5 mmol/L (ref 3.5–5.1)
Sodium: 131 mmol/L — ABNORMAL LOW (ref 135–145)
Total Bilirubin: 2.4 mg/dL — ABNORMAL HIGH (ref 0.3–1.2)
Total Protein: 7.4 g/dL (ref 6.5–8.1)

## 2016-07-11 LAB — LIPASE, BLOOD: Lipase: 28 U/L (ref 11–51)

## 2016-07-11 LAB — TROPONIN I
Troponin I: 0.13 ng/mL (ref <0.03)
Troponin I: 0.12 ng/mL (ref <0.03)

## 2016-07-11 MED ORDER — ALBUTEROL SULFATE HFA 108 (90 BASE) MCG/ACT IN AERS: 1.0000 | INHALATION_SPRAY | Freq: Four times a day (QID) | RESPIRATORY_TRACT | 0 refills | Status: DC | PRN

## 2016-07-11 NOTE — Discharge Instructions (Signed)
Please call your primary care provider on 07/14/15 to make an appointment next week to discuss your shortness of breath and recent emergency department visits.  Your primary care provider may refer you to a cardiologist for further work up to evaluate your heart.   Please go to dialysis today, as you were recommended.

## 2016-07-11 NOTE — ED Notes (Signed)
Pt states he is unable to and refuses urine sample.

## 2016-07-11 NOTE — ED Notes (Signed)
Asked pt for a urine specimen and pt became angry and said "you ain't sticking nothing up me to make me pee". This NT replied "that is not what was asked, I want you to pee in a urinal." He said "I cant and I wont"

## 2016-07-11 NOTE — ED Notes (Signed)
ED Provider at bedside. 

## 2016-07-11 NOTE — ED Triage Notes (Signed)
BIB EMS from home, reports SOB starting 3 hrs ago. Also reports abd pain/distension. Dialysis pt, MWF. Didn't finish on Friday. VSS. A/OX4, ambulatory.

## 2016-07-11 NOTE — ED Provider Notes (Signed)
[facility] DEPT Provider Note   CSN: [id number] Arrival date & time: 07/11/16  0518     History   Chief Complaint Chief Complaint  Patient presents with  . Shortness of Breath  . Abdominal Pain    HPI Joshua GRANADE is a 55 y.o. male with past medical history of ESRD on HD via LUE fistula on MWF, poorly controlled HTN, hepatitis B, hepatitis C, alcohol abuse, tobacco abuse, cocaine abuse, marijuana abuse presents to the emergency department with shortness of breath and intermittent periumbilical abdominal pain associated with one episode of diarrhea and increased gas.  Symptoms started yesterday after eating a [employer] sandwich.  Pt states shortness of breath started when he was just sitting down, and lasted only a short time.  Pt unable to recall if shortness of breath was associated with abdominal pain.  Pt currently denies shortness of breath.  Pt reports mild abdominal "soreness".    Patient denies chest pain, nausea, vomiting, dizziness, fevers. Patient went to dialysis on Friday, left early and only completed 3 hours above the scheduled 4 hours. Patient states he was instructed to come back for dialysis today. Patient states he last used cocaine yesterday, and marijuana 2 days ago. Patient states he has been trying to quit drinking alcohol, unable to recall when he last consumed alcohol.  Pt smokes 2-3 cigarettes daily.  Patient states he ran out of albuterol inhaler, has not tried any medical interventions for shortness of breath or abdominal pain.  Per chart review, patient's last echo was in 2011 with ejection fraction of 55-60%.  Patient was last seen for shortness of breath in the ED on 06-24-2016. At that time workup was overall unremarkable, patient was discharged with outpatient follow-up which he has not been able to do.  Pt requests "hot breakfast" multiple times during encounter.   HPI  Past Medical History:  Diagnosis Date  . Adenomatous colon polyp    tubular  .  Anemia   . Anxiety   . Chest pain    DATE UNKNOWN, C/O PERIODICALLY  . Chronic kidney disease    dialysis - M/W/F  . Dialysis patient ([place])    Monday-Wednesday-Friday  . GERD (gastroesophageal reflux disease)    DATE UNKNOWN  . Hemorrhoids   . Hepatitis B, chronic ([place])   . Hepatitis C   . Hyperkalemia   . Hypertension   . Metabolic bone disease    Patient denies  . Pneumonia   . Pulmonary edema   . Renal disorder   . Renal insufficiency   . Shortness of breath dyspnea    " for the last past year with this dialysis"    Patient Active Problem List   Diagnosis Date Noted  . End stage renal disease ([place]) 12/02/2015  . Chronic hepatitis B ([place]) 03/05/2014  . Chronic hepatitis C without hepatic coma ([place]) 03/05/2014  . Internal hemorrhoids with bleeding, swelling and itching 03/05/2014  . Thrombocytopenia, unspecified 03/05/2014  . Chest pain 02/27/2014  . ALCOHOL ABUSE 04/14/2009  . TOBACCO ABUSE 04/14/2009  . GANGLION CYST 04/14/2009    Past Surgical History:  Procedure Laterality Date  . AV FISTULA PLACEMENT  2012   BELIEVED WAS PLACED IN JUNE  . HEMORRHOID BANDING    . LIGATION OF ARTERIOVENOUS  FISTULA Left 10/13/1538   Procedure: Plication of Left Arm Arteriovenous Fistula;  Surgeon: Elam Dutch, MD;  Location: [facility];  Service: Vascular;  Laterality: Left;  . REVISON OF ARTERIOVENOUS FISTULA Left 01/20/2016  Procedure: PLICATION OF DISTAL ANEURYSMAL SEGEMENT OF LEFT UPPER ARM ARTERIOVENOUS FISTULA;  Surgeon: Elam Dutch, MD;  Location: [facility];  Service: Vascular;  Laterality: Left;       Home Medications    Prior to Admission medications   Medication Sig Start Date End Date Taking? Authorizing Provider  amLODipine (NORVASC) 10 MG tablet Take 10 mg by mouth at bedtime.    Yes Historical Provider, MD  aspirin EC 81 MG tablet Take 81 mg by mouth daily.   Yes Historical Provider, MD  bisoprolol (ZEBETA) 10 MG tablet Take 10 mg by mouth daily.   Yes  Historical Provider, MD  cinacalcet (SENSIPAR) 30 MG tablet Take 30 mg by mouth daily.   Yes Historical Provider, MD  DULERA 100-5 MCG/ACT AERO Inhale 2 puffs into the lungs 2 (two) times daily as needed for wheezing or shortness of breath.  09/22/15  Yes Historical Provider, MD  lisinopril (PRINIVIL,ZESTRIL) 20 MG tablet Take 40 mg by mouth daily.   Yes Historical Provider, MD  polyethylene glycol powder (GLYCOLAX/MIRALAX) powder MIX 17 GM WITH 8 OUNCES OF LIQUID AND TAKE BY MOUTH DAILY 03/22/16  Yes Gatha Mayer, MD  predniSONE (DELTASONE) 50 MG tablet Take 1 tablet daily with breakfast 06/24/16  Yes Nicole Pisciotta, PA-C  sevelamer carbonate (RENVELA) 800 MG tablet Take 1,600-3,200 mg by mouth See admin instructions. Take 3200 mg by mouth 3 times daily with meals and take 1600 mg by mouth with snacks.   Yes Historical Provider, MD  traZODone (DESYREL) 50 MG tablet Take 50-100 mg by mouth at bedtime as needed for sleep.   Yes Historical Provider, MD  albuterol (PROVENTIL HFA;VENTOLIN HFA) 108 (90 Base) MCG/ACT inhaler Inhale 1-2 puffs into the lungs every 6 (six) hours as needed for wheezing or shortness of breath. 07/11/16   Kinnie Feil, PA-C  oxyCODONE-acetaminophen (PERCOCET/ROXICET) 5-325 MG tablet Take 1 tablet by mouth every 6 (six) hours as needed for moderate pain. Patient not taking: Reported on 07/11/2016 01/20/16   Elam Dutch, MD    Family History Family History  Problem Relation Age of Onset  . Heart disease Mother   . Lung cancer Mother   . Heart disease Father   . Malignant hyperthermia Father   . Throat cancer Sister   . Hypertension Other   . COPD Other   . Colon cancer Neg Hx     Social History Social History  Substance Use Topics  . Smoking status: Current Every Day Smoker    Packs/day: 0.04    Years: 37.00    Types: Cigarettes  . Smokeless tobacco: Never Used     Comment: 5 cigarettes per day.   . Alcohol use 2.4 oz/week    1 Glasses of wine, 1  Cans of beer, 1 Shots of liquor, 1 Standard drinks or equivalent per week     Comment: quit     Allergies   Aspirin; Clonidine derivatives; and Tramadol   Review of Systems Review of Systems  Constitutional: Negative for chills and fever.  HENT: Negative for congestion and sore throat.   Eyes: Negative for visual disturbance.  Respiratory: Positive for shortness of breath. Negative for cough.   Cardiovascular: Negative for chest pain and palpitations.  Gastrointestinal: Positive for abdominal pain and diarrhea. Negative for blood in stool, constipation, nausea and vomiting.  Genitourinary: Positive for difficulty urinating. Negative for flank pain and hematuria.  Musculoskeletal: Negative for joint swelling and myalgias.  Skin: Negative for rash.  Neurological: Negative for dizziness, syncope, weakness, light-headedness and headaches.  Hematological: Negative.   Psychiatric/Behavioral: The patient is nervous/anxious.      Physical Exam Updated Vital Signs BP (!) 202/96 (BP Location: Right Arm)   Pulse 73   Temp 98 F (36.7 C) (Oral)   Resp 19   Ht 5\' 9"  (1.753 m)   Wt 65.8 kg   SpO2 99%   BMI 21.41 kg/m   Physical Exam  Constitutional: He is oriented to person, place, and time. Vital signs are normal. He appears well-developed and well-nourished. No distress.  HENT:  Head: Normocephalic and atraumatic.  Nose: Nose normal.  Mouth/Throat: Oropharynx is clear and moist. No oropharyngeal exudate.  Eyes: Conjunctivae and EOM are normal. Pupils are equal, round, and reactive to light. No scleral icterus.  Neck: Normal range of motion. No JVD present.  Cardiovascular: Normal rate, regular rhythm, normal heart sounds and intact distal pulses.   No murmur heard. Pulmonary/Chest: Effort normal and breath sounds normal. No respiratory distress. He has no wheezes. He has no rales. He exhibits no tenderness.  Abdominal:  Mildly distended.  Diffuse tenderness, pt reports most  tender at L of umbilicus.  Questionable rigidity.  No guarding, mass, rebound or hernia.  Unreliable McMurphy's and McBurney's as patient reports diffuse tenderness. No suprapubic tenderness.  No CVAT.  Musculoskeletal: Normal range of motion.  Lymphadenopathy:    He has no cervical adenopathy.  Neurological: He is alert and oriented to person, place, and time.  Skin: Skin is warm and dry.  Psychiatric: He has a normal mood and affect. His behavior is normal.  Nursing note and vitals reviewed.    ED Treatments / Results  Labs (all labs ordered are listed, but only abnormal results are displayed) Labs Reviewed  COMPREHENSIVE METABOLIC PANEL - Abnormal; Notable for the following:       Result Value   Sodium 131 (*)    Chloride 92 (*)    BUN 66 (*)    Creatinine, Ser 8.66 (*)    Albumin 3.3 (*)    AST 68 (*)    Total Bilirubin 2.4 (*)    GFR calc non Af Amer 6 (*)    GFR calc Af Amer 7 (*)    All other components within normal limits  CBC - Abnormal; Notable for the following:    RBC 3.63 (*)    Hemoglobin 10.4 (*)    HCT 29.9 (*)    RDW 19.3 (*)    Platelets 52 (*)    All other components within normal limits  TROPONIN I - Abnormal; Notable for the following:    Troponin I 0.13 (*)    All other components within normal limits  TROPONIN I - Abnormal; Notable for the following:    Troponin I 0.12 (*)    All other components within normal limits  I-STAT CHEM 8, ED - Abnormal; Notable for the following:    Sodium 131 (*)    Chloride 94 (*)    BUN 62 (*)    Creatinine, Ser 8.80 (*)    Hemoglobin 11.9 (*)    HCT 35.0 (*)    All other components within normal limits  LIPASE, BLOOD    EKG  EKG Interpretation  Date/Time:  Sunday July 11 2016 05:25:05 EST Ventricular Rate:  73 PR Interval:    QRS Duration: 182 QT Interval:  502 QTC Calculation: 554 R Axis:   -80 Text Interpretation:  Sinus rhythm Atrial premature complex Prolonged  PR interval Left bundle branch  block No significant change since last tracing Confirmed by FLOYD MD, Quillian Quince [phone]) on 07/11/2016 5:27:36 AM Also confirmed by Tyrone Nine MD, DANIEL [phone]), editor Sandy, Joelene Millin [phone])  on 07/11/2016 11:04:07 AM       Radiology Dg Chest 2 View  Result Date: 07/11/2016 CLINICAL DATA:  Shortness of breath for 3 hours, abdominal pain and distension. History of end-stage renal disease on dialysis, hypertension, hepatitis. EXAM: CHEST  2 VIEW COMPARISON:  Chest radiograph June 24, 2016 FINDINGS: Cardiac silhouette is moderately enlarged. Calcified aortic knob. Pulmonary vascular congestion without pleural effusion or focal consolidation strandy densities LEFT lung base. No pneumothorax. Elevated LEFT hemidiaphragm. Soft tissue planes and included osseous structures are nonsuspicious. Stable mild wedging of mid to lower thoracic vertebral bodies. IMPRESSION: Stable cardiomegaly and pulmonary vascular congestion. LEFT lung base atelectasis/scarring. Electronically Signed   By: Elon Alas M.D.   On: 07/11/2016 06:13    Procedures Procedures (including critical care time)  Medications Ordered in ED Medications - No data to display   Initial Impression / Assessment and Plan / ED Course  I have reviewed the triage vital signs and the nursing notes.  Pertinent labs & imaging results that were available during my care of the patient were reviewed by me and considered in my medical decision making (see chart for details).  Clinical Course as of Jul 11 1108  Sun Jul 11, 2016  1053 Re-evaluation: pt asleep.  Pt denies shortness of breath, chest pain, n/v and abdominal pain.  Pt encouraged to go to dialysis today, pt states he is not going because "he is tired".  Pt states he will go Monday.   [CG]    Clinical Course User Index [CG] Kinnie Feil, PA-C    Initial troponin elevated at 0.13, repeat troponin 0.12. Patient denied shortness of breath, chest pain, n/v and abdominal pain in  the emergency department. Patient requested food tray, given, tolerated food and liquids without nausea, vomiting or abdominal pain. Repeat physical exam benign, patient denied abdominal pain, n/v and shortness of breath again.  EKG and chest x-ray unchanged from previous studies. All other ED workup unremarkable for a ESRD dialysis patient.  Pt became angry when asked for urine sample.  He does produce urine still. Last cocaine use two days ago, last marijuana used yesterday.  His BP has remains elevated consistent with poorly controlled HTN, patient denied headache or changes in vision.  Patient will be discharged with close PCP follow-up and cardiology follow-up for further discussion of his shortness of breath and recent ED visits. His symptoms and ED work up are not consistent with ACS at this time, troponin can be elevated in ESRD / HD patients. Patient was encouraged to go to dialysis today as scheduled, patient states he will not go because "he is tired". Patient plans on going to dialysis on Monday. Strict ED return instructions given, patient verbalized understanding. Patient was discussed and evaluated by Dr. Tamera Punt who agrees with discharge plan. Patient requested albuterol refill, given. Final Clinical Impressions(s) / ED Diagnoses   Final diagnoses:  SOB (shortness of breath)  Abdominal pain, unspecified abdominal location    New Prescriptions New Prescriptions   ALBUTEROL (PROVENTIL HFA;VENTOLIN HFA) 108 (90 BASE) MCG/ACT INHALER    Inhale 1-2 puffs into the lungs every 6 (six) hours as needed for wheezing or shortness of breath.     Kinnie Feil, PA-C 07/11/16 [place], MD 07/11/16 1537

## 2016-07-11 NOTE — ED Notes (Signed)
Patient transported to X-ray 

## 2016-07-11 NOTE — ED Notes (Signed)
Called Main lab to add on Troponin lab.

## 2016-07-11 NOTE — ED Notes (Signed)
Pt also admits to cocaine use

## 2016-07-11 NOTE — ED Notes (Signed)
Pt given graham crackers & breakfast tray ordered

## 2016-07-14 DIAGNOSIS — N2581 Secondary hyperparathyroidism of renal origin: Secondary | ICD-10-CM | POA: Diagnosis not present

## 2016-07-14 DIAGNOSIS — N186 End stage renal disease: Secondary | ICD-10-CM | POA: Diagnosis not present

## 2016-07-14 DIAGNOSIS — D509 Iron deficiency anemia, unspecified: Secondary | ICD-10-CM | POA: Diagnosis not present

## 2016-07-14 DIAGNOSIS — D631 Anemia in chronic kidney disease: Secondary | ICD-10-CM | POA: Diagnosis not present

## 2016-07-15 DIAGNOSIS — I10 Essential (primary) hypertension: Secondary | ICD-10-CM | POA: Diagnosis not present

## 2016-07-15 DIAGNOSIS — B182 Chronic viral hepatitis C: Secondary | ICD-10-CM | POA: Diagnosis not present

## 2016-07-15 DIAGNOSIS — F419 Anxiety disorder, unspecified: Secondary | ICD-10-CM | POA: Diagnosis not present

## 2016-07-15 DIAGNOSIS — N186 End stage renal disease: Secondary | ICD-10-CM | POA: Diagnosis not present

## 2016-07-15 DIAGNOSIS — D696 Thrombocytopenia, unspecified: Secondary | ICD-10-CM | POA: Diagnosis not present

## 2016-07-15 DIAGNOSIS — J45909 Unspecified asthma, uncomplicated: Secondary | ICD-10-CM | POA: Diagnosis not present

## 2016-07-15 DIAGNOSIS — D638 Anemia in other chronic diseases classified elsewhere: Secondary | ICD-10-CM | POA: Diagnosis not present

## 2016-07-15 DIAGNOSIS — Z720 Tobacco use: Secondary | ICD-10-CM | POA: Diagnosis not present

## 2016-07-15 DIAGNOSIS — I429 Cardiomyopathy, unspecified: Secondary | ICD-10-CM | POA: Diagnosis not present

## 2016-07-15 DIAGNOSIS — B1910 Unspecified viral hepatitis B without hepatic coma: Secondary | ICD-10-CM | POA: Diagnosis not present

## 2016-07-15 DIAGNOSIS — G4700 Insomnia, unspecified: Secondary | ICD-10-CM | POA: Diagnosis not present

## 2016-07-16 DIAGNOSIS — D509 Iron deficiency anemia, unspecified: Secondary | ICD-10-CM | POA: Diagnosis not present

## 2016-07-16 DIAGNOSIS — N2581 Secondary hyperparathyroidism of renal origin: Secondary | ICD-10-CM | POA: Diagnosis not present

## 2016-07-16 DIAGNOSIS — N186 End stage renal disease: Secondary | ICD-10-CM | POA: Diagnosis not present

## 2016-07-16 DIAGNOSIS — D631 Anemia in chronic kidney disease: Secondary | ICD-10-CM | POA: Diagnosis not present

## 2016-07-19 ENCOUNTER — Telehealth: Payer: Self-pay | Admitting: *Deleted

## 2016-07-19 ENCOUNTER — Encounter (HOSPITAL_COMMUNITY): Payer: Self-pay | Admitting: Emergency Medicine

## 2016-07-19 DIAGNOSIS — R319 Hematuria, unspecified: Secondary | ICD-10-CM | POA: Diagnosis not present

## 2016-07-19 DIAGNOSIS — N2581 Secondary hyperparathyroidism of renal origin: Secondary | ICD-10-CM | POA: Diagnosis not present

## 2016-07-19 DIAGNOSIS — F17210 Nicotine dependence, cigarettes, uncomplicated: Secondary | ICD-10-CM | POA: Diagnosis not present

## 2016-07-19 DIAGNOSIS — I120 Hypertensive chronic kidney disease with stage 5 chronic kidney disease or end stage renal disease: Secondary | ICD-10-CM | POA: Diagnosis not present

## 2016-07-19 DIAGNOSIS — Z7982 Long term (current) use of aspirin: Secondary | ICD-10-CM | POA: Insufficient documentation

## 2016-07-19 DIAGNOSIS — D631 Anemia in chronic kidney disease: Secondary | ICD-10-CM | POA: Diagnosis not present

## 2016-07-19 DIAGNOSIS — N186 End stage renal disease: Secondary | ICD-10-CM | POA: Diagnosis not present

## 2016-07-19 DIAGNOSIS — R310 Gross hematuria: Secondary | ICD-10-CM | POA: Diagnosis not present

## 2016-07-19 DIAGNOSIS — D509 Iron deficiency anemia, unspecified: Secondary | ICD-10-CM | POA: Diagnosis not present

## 2016-07-19 NOTE — Telephone Encounter (Signed)
Patient scheduled for New Hep C appointment Thursday 1/11, 2:30.  OK per Dr Linus Salmons, as patient has dialysis MWF. Patient given phone number, address, directions. He verbalized understanding, agreement. Landis Gandy, RN

## 2016-07-19 NOTE — ED Triage Notes (Signed)
Patient reports hematuria x2 this evening , denies pain / no fever , hemodialysis q Mon/Wed/Fri.

## 2016-07-20 ENCOUNTER — Emergency Department (HOSPITAL_COMMUNITY)
Admission: EM | Admit: 2016-07-20 | Discharge: 2016-07-20 | Disposition: A | Discharge status: Home / Self Care | Payer: Medicare Other | Attending: Emergency Medicine | Admitting: Emergency Medicine

## 2016-07-20 DIAGNOSIS — R319 Hematuria, unspecified: Secondary | ICD-10-CM

## 2016-07-20 DIAGNOSIS — R310 Gross hematuria: Secondary | ICD-10-CM | POA: Diagnosis not present

## 2016-07-20 LAB — CBC WITH DIFFERENTIAL/PLATELET
Basophils Absolute: 0 10*3/uL (ref 0.0–0.1)
Basophils Relative: 0 %
Eosinophils Absolute: 0.1 10*3/uL (ref 0.0–0.7)
Eosinophils Relative: 1 %
HCT: 24.5 % — ABNORMAL LOW (ref 39.0–52.0)
Hemoglobin: 8.5 g/dL — ABNORMAL LOW (ref 13.0–17.0)
Lymphocytes Relative: 20 %
Lymphs Abs: 1.1 10*3/uL (ref 0.7–4.0)
MCH: 28.6 pg (ref 26.0–34.0)
MCHC: 34.7 g/dL (ref 30.0–36.0)
MCV: 82.5 fL (ref 78.0–100.0)
Monocytes Absolute: 0.3 10*3/uL (ref 0.1–1.0)
Monocytes Relative: 5 %
Neutro Abs: 3.9 10*3/uL (ref 1.7–7.7)
Neutrophils Relative %: 73 %
Platelets: 38 10*3/uL — ABNORMAL LOW (ref 150–400)
RBC: 2.97 MIL/uL — ABNORMAL LOW (ref 4.22–5.81)
RDW: 19 % — ABNORMAL HIGH (ref 11.5–15.5)
WBC: 5.3 10*3/uL (ref 4.0–10.5)

## 2016-07-20 LAB — COMPREHENSIVE METABOLIC PANEL
ALT: 42 U/L (ref 17–63)
AST: 51 U/L — ABNORMAL HIGH (ref 15–41)
Albumin: 3.1 g/dL — ABNORMAL LOW (ref 3.5–5.0)
Alkaline Phosphatase: 89 U/L (ref 38–126)
Anion gap: 15 (ref 5–15)
BUN: 42 mg/dL — ABNORMAL HIGH (ref 6–20)
CO2: 27 mmol/L (ref 22–32)
Calcium: 9.4 mg/dL (ref 8.9–10.3)
Chloride: 91 mmol/L — ABNORMAL LOW (ref 101–111)
Creatinine, Ser: 8.06 mg/dL — ABNORMAL HIGH (ref 0.61–1.24)
GFR calc Af Amer: 8 mL/min — ABNORMAL LOW (ref >60)
GFR calc non Af Amer: 7 mL/min — ABNORMAL LOW (ref >60)
Glucose, Bld: 120 mg/dL — ABNORMAL HIGH (ref 65–99)
Potassium: 3.6 mmol/L (ref 3.5–5.1)
Sodium: 133 mmol/L — ABNORMAL LOW (ref 135–145)
Total Bilirubin: 1.9 mg/dL — ABNORMAL HIGH (ref 0.3–1.2)
Total Protein: 6.9 g/dL (ref 6.5–8.1)

## 2016-07-20 LAB — URINALYSIS, MICROSCOPIC (REFLEX)

## 2016-07-20 MED ORDER — CEPHALEXIN 500 MG PO CAPS: 500.0000 mg | ORAL_CAPSULE | Freq: Two times a day (BID) | ORAL | 0 refills | Status: AC

## 2016-07-20 NOTE — ED Notes (Signed)
Pt called to reassess vitals and to be taken to a room. No answer.

## 2016-07-20 NOTE — ED Provider Notes (Signed)
[facility] DEPT Provider Note   CSN: [id number] Arrival date & time: 07/19/16  2329     History   Chief Complaint Chief Complaint  Patient presents with  . Hematuria    HPI [name] is a 56 y.o. male.  H/O ESRD-HD, HTN, Hep B, Hep C, alcohol abuse. Hematuria since last night x 2. No dysuria, fever, abdominal pain, hesitation. He is a renal failure patient but states he produces urine at least once daily. No history of bleeding with urination in the past. No history of kidney stones. No testicular pain or scrotal swelling.     The history is provided by the patient. No language interpreter was used.  Hematuria     Past Medical History:  Diagnosis Date  . Adenomatous colon polyp    tubular  . Anemia   . Anxiety   . Chest pain    DATE UNKNOWN, C/O PERIODICALLY  . Chronic kidney disease    dialysis - M/W/F  . Dialysis patient ([place] [facility])    Monday-Wednesday-Friday  . GERD (gastroesophageal reflux disease)    DATE UNKNOWN  . Hemorrhoids   . Hepatitis B, chronic ([place])   . Hepatitis C   . Hyperkalemia   . Hypertension   . Metabolic bone disease    Patient denies  . Pneumonia   . Pulmonary edema   . Renal disorder   . Renal insufficiency   . Shortness of breath dyspnea    " for the last past year with this dialysis"    Patient Active Problem List   Diagnosis Date Noted  . End stage renal disease ([place]) 12/02/2015  . Chronic hepatitis B ([place]) 03/05/2014  . Chronic hepatitis C without hepatic coma ([place]) 03/05/2014  . Internal hemorrhoids with bleeding, swelling and itching 03/05/2014  . Thrombocytopenia, unspecified 03/05/2014  . Chest pain 02/27/2014  . ALCOHOL ABUSE 04/14/2009  . TOBACCO ABUSE 04/14/2009  . GANGLION CYST 04/14/2009    Past Surgical History:  Procedure Laterality Date  . AV FISTULA PLACEMENT  2012   BELIEVED WAS PLACED IN JUNE  . HEMORRHOID BANDING    . LIGATION OF ARTERIOVENOUS  FISTULA Left 03/16/1739   Procedure: Plication of Left  Arm Arteriovenous Fistula;  Surgeon: Elam Dutch, MD;  Location: [facility];  Service: Vascular;  Laterality: Left;  . REVISON OF ARTERIOVENOUS FISTULA Left 02/22/4817   Procedure: PLICATION OF DISTAL ANEURYSMAL SEGEMENT OF LEFT UPPER ARM ARTERIOVENOUS FISTULA;  Surgeon: Elam Dutch, MD;  Location: [facility];  Service: Vascular;  Laterality: Left;       Home Medications    Prior to Admission medications   Medication Sig Start Date End Date Taking? Authorizing Provider  albuterol (PROVENTIL HFA;VENTOLIN HFA) 108 (90 Base) MCG/ACT inhaler Inhale 1-2 puffs into the lungs every 6 (six) hours as needed for wheezing or shortness of breath. 07/11/16   Kinnie Feil, PA-C  amLODipine (NORVASC) 10 MG tablet Take 10 mg by mouth at bedtime.     Historical Provider, MD  aspirin EC 81 MG tablet Take 81 mg by mouth daily.    Historical Provider, MD  bisoprolol (ZEBETA) 10 MG tablet Take 10 mg by mouth daily.    Historical Provider, MD  cinacalcet (SENSIPAR) 30 MG tablet Take 30 mg by mouth daily.    Historical Provider, MD  DULERA 100-5 MCG/ACT AERO Inhale 2 puffs into the lungs 2 (two) times daily as needed for wheezing or shortness of breath.  09/22/15   Historical Provider, MD  lisinopril (PRINIVIL,ZESTRIL) 20 MG tablet Take 40 mg by mouth daily.    Historical Provider, MD  oxyCODONE-acetaminophen (PERCOCET/ROXICET) 5-325 MG tablet Take 1 tablet by mouth every 6 (six) hours as needed for moderate pain. Patient not taking: Reported on 07/11/2016 01/20/16   Elam Dutch, MD  polyethylene glycol powder (GLYCOLAX/MIRALAX) powder MIX 17 GM WITH 8 OUNCES OF LIQUID AND TAKE BY MOUTH DAILY 03/22/16   Gatha Mayer, MD  predniSONE (DELTASONE) 50 MG tablet Take 1 tablet daily with breakfast 06/24/16   Elmyra Ricks Pisciotta, PA-C  sevelamer carbonate (RENVELA) 800 MG tablet Take 1,600-3,200 mg by mouth See admin instructions. Take 3200 mg by mouth 3 times daily with meals and take 1600 mg by mouth with snacks.     Historical Provider, MD  traZODone (DESYREL) 50 MG tablet Take 50-100 mg by mouth at bedtime as needed for sleep.    Historical Provider, MD    Family History Family History  Problem Relation Age of Onset  . Heart disease Mother   . Lung cancer Mother   . Heart disease Father   . Malignant hyperthermia Father   . Throat cancer Sister   . Hypertension Other   . COPD Other   . Colon cancer Neg Hx     Social History Social History  Substance Use Topics  . Smoking status: Current Every Day Smoker    Packs/day: 0.04    Years: 37.00    Types: Cigarettes  . Smokeless tobacco: Never Used     Comment: 5 cigarettes per day.   . Alcohol use 2.4 oz/week    1 Glasses of wine, 1 Cans of beer, 1 Shots of liquor, 1 Standard drinks or equivalent per week     Comment: quit     Allergies   Aspirin; Clonidine derivatives; and Tramadol   Review of Systems Review of Systems  Constitutional: Negative for chills and fever.  HENT: Negative.   Respiratory: Negative.   Cardiovascular: Negative.   Gastrointestinal: Negative.   Genitourinary: Positive for hematuria.  Musculoskeletal: Negative.   Skin: Negative.   Neurological: Negative.      Physical Exam Updated Vital Signs BP 177/96   Pulse 69   Temp 97.8 F (36.6 C) (Oral)   Resp 16   Ht 5\' 9"  (1.753 m)   Wt 65.8 kg   SpO2 99%   BMI 21.41 kg/m   Physical Exam  Constitutional: He is oriented to person, place, and time. He appears well-developed and well-nourished.  Neck: Normal range of motion.  Pulmonary/Chest: Effort normal.  Abdominal: He exhibits no distension. There is no tenderness.  Genitourinary:  Genitourinary Comments: Uncircumcised penis without blood at the meatus. No lesion or bleeding ulceration. No redness or swelling. Nontender. No testicular tenderness or scrotal swelling.  Musculoskeletal: Normal range of motion.  Neurological: He is alert and oriented to person, place, and time.  Skin: Skin is warm and  dry.  Psychiatric: He has a normal mood and affect.     ED Treatments / Results  Labs (all labs ordered are listed, but only abnormal results are displayed) Labs Reviewed  CBC WITH DIFFERENTIAL/PLATELET - Abnormal; Notable for the following:       Result Value   RBC 2.97 (*)    Hemoglobin 8.5 (*)    HCT 24.5 (*)    RDW 19.0 (*)    Platelets 38 (*)    All other components within normal limits  COMPREHENSIVE METABOLIC PANEL - Abnormal; Notable for the following:  Sodium 133 (*)    Chloride 91 (*)    Glucose, Bld 120 (*)    BUN 42 (*)    Creatinine, Ser 8.06 (*)    Albumin 3.1 (*)    AST 51 (*)    Total Bilirubin 1.9 (*)    GFR calc non Af Amer 7 (*)    GFR calc Af Amer 8 (*)    All other components within normal limits  URINALYSIS, MICROSCOPIC (REFLEX) - Abnormal; Notable for the following:    Bacteria, UA RARE (*)    Squamous Epithelial / LPF 0-5 (*)    All other components within normal limits    EKG  EKG Interpretation None       Radiology No results found.  Procedures Procedures (including critical care time)  Medications Ordered in ED Medications - No data to display   Initial Impression / Assessment and Plan / ED Course  I have reviewed the triage vital signs and the nursing notes.  Pertinent labs & imaging results that were available during my care of the patient were reviewed by me and considered in my medical decision making (see chart for details).  Clinical Course     Patient with new onset non-tender, gross hematuria starting earlier today. Labs reviewed with Dr. Christy Gentles and considered stable. No evidence urinary infection, however, testing potentially obscured by bleeding. Will cover with keflex and encourage close follow up with nephrology.   Final Clinical Impressions(s) / ED Diagnoses   Final diagnoses:  None   1. Gross hematuria  New Prescriptions New Prescriptions   No medications on file     Charlann Lange, Hershal Coria 07/20/16  2232    Ripley Fraise, MD 07/20/16 [phone]

## 2016-07-20 NOTE — ED Notes (Signed)
Sandwich, coffee, and ginger ale given

## 2016-07-20 NOTE — Discharge Instructions (Signed)
TAKE KEFLEX TWICE DAILY AS DIRECTED. FOLLOW UP WITH YOUR NEPHROLOGIST FOR RECHECK TOMORROW DURING DIALYSIS. RETURN TO THE EMERGENCY DEPARTMENT IF SYMPTOMS WORSEN: YOU DEVELOP PAIN, FEVER OR NEW CONCERN.

## 2016-07-20 NOTE — ED Notes (Signed)
Pt was sleeping out in the waiting room.

## 2016-07-20 NOTE — ED Notes (Signed)
Pt given urinal; reports will attempt to provide sample.

## 2016-07-21 DIAGNOSIS — D631 Anemia in chronic kidney disease: Secondary | ICD-10-CM | POA: Diagnosis not present

## 2016-07-21 DIAGNOSIS — N186 End stage renal disease: Secondary | ICD-10-CM | POA: Diagnosis not present

## 2016-07-21 DIAGNOSIS — N2581 Secondary hyperparathyroidism of renal origin: Secondary | ICD-10-CM | POA: Diagnosis not present

## 2016-07-21 DIAGNOSIS — D509 Iron deficiency anemia, unspecified: Secondary | ICD-10-CM | POA: Diagnosis not present

## 2016-07-22 ENCOUNTER — Ambulatory Visit (INDEPENDENT_AMBULATORY_CARE_PROVIDER_SITE_OTHER): Payer: Medicare Other | Admitting: Internal Medicine

## 2016-07-22 ENCOUNTER — Encounter: Payer: Self-pay | Admitting: Internal Medicine

## 2016-07-22 VITALS — BP 187/92 | HR 79 | Temp 97.7°F | Ht 69.0 in | Wt 146.0 lb

## 2016-07-22 DIAGNOSIS — B182 Chronic viral hepatitis C: Secondary | ICD-10-CM | POA: Diagnosis not present

## 2016-07-22 DIAGNOSIS — B181 Chronic viral hepatitis B without delta-agent: Secondary | ICD-10-CM

## 2016-07-22 NOTE — Patient Instructions (Signed)
Date 07/22/16  Dear Mr Shahin, As discussed in the [facility], your hepatitis C therapy will include the following medications:          Mavyret (glecaprevir 100 mg/pibrentasvir 40 mg): Take 3 tablets by mouth once daily for 8 or 12 weeks ---------------------------------------------------------------- Your HCV Treatment Start Date: TBA   Your HCV genotype:  unknown    Liver Fibrosis: TBD    ---------------------------------------------------------------- YOUR PHARMACY CONTACT:   [facility] [address] [place], [place] [address] Phone: [phone] Hours: Monday to Friday 7:30 am to 6:00 pm   Please always contact your pharmacy at least 3-4 business days before you run out of medications to ensure your next month's medication is ready or 1 week prior to running out if you receive it by mail.  Remember, each prescription is for 28 days. ---------------------------------------------------------------- GENERAL NOTES REGARDING YOUR HEPATITIS C MEDICATION:  Mavyret: - tablets are pink, oblong shape - take 3 tablets daily with food. - The tablets should be stored at room temperature.  - Acid reducing agents such as H2 blockers (ie. Pepcid (famotidine), Zantac (ranitidine), Tagamet (cimetidine), Axid (nizatidine) and proton pump inhibitors (ie. Prilosec (omeprazole), Protonix (pantoprazole), Nexium (esomeprazole), or Aciphex (rabeprazole)) can decrease effectiveness of Harvoni. Do not take until you have discussed with a health care provider.    -Antacids that contain magnesium and/or aluminum hydroxide (ie. Milk of Magensia, Rolaids, Gaviscon, Maalox, Mylanta, an dArthritis Pain Formula) can reduce absorption of Harvoni, so take them at least 4 hours before or after Harvoni.  -Calcium carbonate (calcium supplements or antacids such as Tums, Caltrate, Os-Cal) needs to be taken at least 4 hours hours before or after Harvoni.  -St. John's wort or any products that  contain St. John's wort like some herbal supplements  Please inform the office prior to starting any of these medications.  - The common side effects associated with Mavyret include:      1. Fatigue      2. Headache      3. Nausea      4. Diarrhea      5. Insomnia  Please note that this only lists the most common side effects and is NOT a comprehensive list of the potential side effects of these medications. For more information, please review the drug information sheets that come with your medication package from the pharmacy.  ---------------------------------------------------------------- GENERAL HELPFUL HINTS ON HCV THERAPY: 1. Stay well-hydrated. 2. Notify the [facility] of any changes in your other over-the-counter/herbal or prescription medications. 3. If you miss a dose of your medication, take the missed dose as soon as you remember. Return to your regular time/dose schedule the next day.  4.  Do not stop taking your medications without first talking with your healthcare provider. 5.  You may take Tylenol (acetaminophen), as long as the dose is less than 2000 mg (OR no more than 4 tablets of the Tylenol Extra Strengths 500mg  tablet) in 24 hours. 6.  You will see our pharmacist-specialist within the first 2 weeks of starting your medication to monitor for any possible side effects. 7.  You will have labs once during treatment, after soon after treatment completion and one final lab 6 months after treatment completion to verify the virus is out of your system.  Scharlene Gloss, [facility] [place], [place]  [id number] [phone]

## 2016-07-22 NOTE — Progress Notes (Addendum)
[facility]   CC: consideration for treatment for chronic hepatitis C  HPI:  +[name] is a 56 y.o. male who presents for initial evaluation and management of chronic hepatitis C.  Patient tested positive many years ago, patient does not elaborate. Hepatitis C-associated risk factors present are: has smoked crack cocaine. Patient denies IV drug abuse, multiple sexual partners. Patient has not had other studies performed. Results: hepatitis C RNA by PCR in the past, result: positive. Patient has not had prior treatment for Hepatitis C. Patient does have a past history of liver disease. Patient does not have a family history of liver disease. Patient does not  have associated signs or symptoms related to liver disease.  Labs reviewed and confirm chronic hepatitis C with a positive viral load.   Records reviewed from Epic and he has had positive cocaine UDS and endorses smoking but has quit now.  Uses it to relieve pain.   He also has acitve hepatitis B with surface Ag positive in 2015.  He was not aware of this by his report.     Patient does not have documented immunity to Hepatitis A.   Review of Systems:   Constitutional: positive for fatigue and malaise or negative for anorexia and weight loss Gastrointestinal: negative for diarrhea Integument/breast: negative for rash Musculoskeletal: positive for myalgias and arthralgias, negative for muscle weakness All other systems reviewed and are negative       Past Medical History:  Diagnosis Date  . Adenomatous colon polyp    tubular  . Anemia   . Anxiety   . Chest pain    DATE UNKNOWN, C/O PERIODICALLY  . Chronic kidney disease    dialysis - M/W/F  . Dialysis patient ([facility])    Monday-Wednesday-Friday  . GERD (gastroesophageal reflux disease)    DATE UNKNOWN  . Hemorrhoids   . Hepatitis B, chronic ([place])   . Hepatitis C   . Hyperkalemia   . Hypertension   . Metabolic bone disease    Patient  denies  . Pneumonia   . Pulmonary edema   . Renal disorder   . Renal insufficiency   . Shortness of breath dyspnea    " for the last past year with this dialysis"    Prior to Admission medications   Medication Sig Start Date End Date Taking? Authorizing Provider  albuterol (PROVENTIL HFA;VENTOLIN HFA) 108 (90 Base) MCG/ACT inhaler Inhale 1-2 puffs into the lungs every 6 (six) hours as needed for wheezing or shortness of breath. 07/11/16  Yes Kinnie Feil, PA-C  amLODipine (NORVASC) 10 MG tablet Take 10 mg by mouth at bedtime.    Yes Historical Provider, MD  aspirin EC 81 MG tablet Take 81 mg by mouth daily.   Yes Historical Provider, MD  bisoprolol (ZEBETA) 10 MG tablet Take 10 mg by mouth daily.   Yes Historical Provider, MD  cephALEXin (KEFLEX) 500 MG capsule Take 1 capsule (500 mg total) by mouth 2 (two) times daily. 07/20/16 07/25/16 Yes Shari Upstill, PA-C  cinacalcet (SENSIPAR) 30 MG tablet Take 30 mg by mouth daily.   Yes Historical Provider, MD  DULERA 100-5 MCG/ACT AERO Inhale 2 puffs into the lungs 2 (two) times daily as needed for wheezing or shortness of breath.  09/22/15  Yes Historical Provider, MD  lisinopril (PRINIVIL,ZESTRIL) 20 MG tablet Take 40 mg by mouth daily.   Yes Historical Provider, MD  polyethylene glycol powder (GLYCOLAX/MIRALAX) powder MIX 17 GM WITH  8 OUNCES OF LIQUID AND TAKE BY MOUTH DAILY 03/22/16  Yes Gatha Mayer, MD  predniSONE (DELTASONE) 50 MG tablet Take 1 tablet daily with breakfast 06/24/16  Yes Nicole Pisciotta, PA-C  sevelamer carbonate (RENVELA) 800 MG tablet Take 1,600-3,200 mg by mouth See admin instructions. Take 3200 mg by mouth 3 times daily with meals and take 1600 mg by mouth with snacks.   Yes Historical Provider, MD  traZODone (DESYREL) 50 MG tablet Take 50-100 mg by mouth at bedtime as needed for sleep.   Yes Historical Provider, MD    Allergies  Allergen Reactions  . Aspirin Other (See Comments)    STOMACH PAIN  . Clonidine  Derivatives Itching  . Tramadol Itching    Social History  Substance Use Topics  . Smoking status: Current Every Day Smoker    Packs/day: 0.04    Years: 37.00    Types: Cigarettes  . Smokeless tobacco: Never Used     Comment: 5 cigarettes per day.   . Alcohol use No     Comment: quit    Family History  Problem Relation Age of Onset  . Heart disease Mother   . Lung cancer Mother   . Heart disease Father   . Malignant hyperthermia Father   . Throat cancer Sister   . Hypertension Other   . COPD Other   . Colon cancer Neg Hx   No cirrhosis, no liver cancer   Objective:  Constitutional: in no apparent distress and alert,  Vitals:   07/22/16 1336  BP: (!) 187/92  Pulse: 79  Temp: 97.7 F (36.5 C)   Eyes: anicteric Cardiovascular: Cor RRR Respiratory: CTA B; normal respiratory effort Gastrointestinal: Bowel sounds are normal, liver is not enlarged, spleen is not enlarged Musculoskeletal: no pedal edema noted; left arm fistula noted Skin: negatives: no rash; no porphyria cutanea tarda Lymphatic: no cervical lymphadenopathy   Laboratory Genotype: No results found for: HCVGENOTYPE HCV viral load:  Lab Results  Component Value Date   HCVQUANT 4,401,027 (H) 03/05/2014   Lab Results  Component Value Date   WBC 5.3 07/19/2016   HGB 8.5 (L) 07/19/2016   HCT 24.5 (L) 07/19/2016   MCV 82.5 07/19/2016   PLT 38 (L) 07/19/2016    Lab Results  Component Value Date   CREATININE 8.06 (H) 07/19/2016   BUN 42 (H) 07/19/2016   NA 133 (L) 07/19/2016   K 3.6 07/19/2016   CL 91 (L) 07/19/2016   CO2 27 07/19/2016    Lab Results  Component Value Date   ALT 42 07/19/2016   AST 51 (H) 07/19/2016   ALKPHOS 89 07/19/2016     Labs and history reviewed and show CHILD-PUGH unkown  5-6 points: Child class A 7-9 points: Child class B 10-15 points: Child class C  Lab Results  Component Value Date   INR 1.01 02/27/2014   BILITOT 1.9 (H) 07/19/2016   ALBUMIN 3.1 (L)  07/19/2016     Assessment: New Patient with Chronic Hepatitis C genotype unknown, untreated.  I discussed with the patient the lab findings that confirm chronic hepatitis C as well as the natural history and progression of disease including about 30% of people who develop cirrhosis of the liver if left untreated and once cirrhosis is established there is a 2-7% risk per year of liver cancer and liver failure.  I discussed the importance of treatment and benefits in reducing the risk, even if significant liver fibrosis exists.   Plan: 1) Patient  counseled extensively on limiting acetaminophen to no more than 2 grams daily, avoidance of alcohol. 2) Transmission discussed with patient including sexual transmission, sharing razors and toothbrush.   3) Will need referral to gastroenterology if concern for cirrhosis 4) Will need referral for substance abuse counseling: Yes.  ; Further work up to include urine drug screen  No. 5) Pneumovax vaccine if not previously given, I do not see any record from nephrology 6) Further work up to include liver staging with elastography  I am going to check his labs and if his ChildPugh score is high from these and from ultrasound, he will not be eligible for treatment.  If no ascites though and T bili ok, can proceed.  Albumin as well recently has been over 3.5 Will also need close monitoring for hepatitis B if he starts.   In review of the labs, INR, CP scoring really is difficulty with dialysis as well, which can give him the ascites.  No cirrhosis noted on recent CT scan therefore I feel his ascites and other labs are clouded by dialysis and ok to proceed with treatment.

## 2016-07-23 ENCOUNTER — Telehealth: Payer: Self-pay

## 2016-07-23 ENCOUNTER — Ambulatory Visit (INDEPENDENT_AMBULATORY_CARE_PROVIDER_SITE_OTHER): Payer: Medicare Other | Admitting: Surgery

## 2016-07-23 ENCOUNTER — Encounter: Payer: Self-pay | Admitting: Surgery

## 2016-07-23 ENCOUNTER — Observation Stay (HOSPITAL_COMMUNITY)
Admission: AD | Admit: 2016-07-23 | Discharge: 2016-07-24 | Disposition: A | Discharge status: Home / Self Care | Payer: Medicare Other | Source: Ambulatory Visit | Attending: Vascular Surgery | Admitting: Vascular Surgery

## 2016-07-23 VITALS — BP 216/94 | HR 84 | Temp 97.7°F | Resp 16 | Ht 69.0 in | Wt 145.0 lb

## 2016-07-23 DIAGNOSIS — K648 Other hemorrhoids: Secondary | ICD-10-CM | POA: Diagnosis not present

## 2016-07-23 DIAGNOSIS — Z79899 Other long term (current) drug therapy: Secondary | ICD-10-CM | POA: Insufficient documentation

## 2016-07-23 DIAGNOSIS — L98491 Non-pressure chronic ulcer of skin of other sites limited to breakdown of skin: Secondary | ICD-10-CM | POA: Insufficient documentation

## 2016-07-23 DIAGNOSIS — B181 Chronic viral hepatitis B without delta-agent: Secondary | ICD-10-CM | POA: Diagnosis not present

## 2016-07-23 DIAGNOSIS — F1010 Alcohol abuse, uncomplicated: Secondary | ICD-10-CM | POA: Diagnosis not present

## 2016-07-23 DIAGNOSIS — R0602 Shortness of breath: Secondary | ICD-10-CM | POA: Diagnosis not present

## 2016-07-23 DIAGNOSIS — Z886 Allergy status to analgesic agent status: Secondary | ICD-10-CM | POA: Insufficient documentation

## 2016-07-23 DIAGNOSIS — T82898A Other specified complication of vascular prosthetic devices, implants and grafts, initial encounter: Secondary | ICD-10-CM | POA: Diagnosis present

## 2016-07-23 DIAGNOSIS — N186 End stage renal disease: Secondary | ICD-10-CM | POA: Diagnosis not present

## 2016-07-23 DIAGNOSIS — F419 Anxiety disorder, unspecified: Secondary | ICD-10-CM | POA: Diagnosis not present

## 2016-07-23 DIAGNOSIS — E8889 Other specified metabolic disorders: Secondary | ICD-10-CM | POA: Insufficient documentation

## 2016-07-23 DIAGNOSIS — K219 Gastro-esophageal reflux disease without esophagitis: Secondary | ICD-10-CM | POA: Diagnosis not present

## 2016-07-23 DIAGNOSIS — N2581 Secondary hyperparathyroidism of renal origin: Secondary | ICD-10-CM | POA: Diagnosis not present

## 2016-07-23 DIAGNOSIS — E875 Hyperkalemia: Secondary | ICD-10-CM | POA: Insufficient documentation

## 2016-07-23 DIAGNOSIS — X58XXXA Exposure to other specified factors, initial encounter: Secondary | ICD-10-CM | POA: Diagnosis not present

## 2016-07-23 DIAGNOSIS — Z8249 Family history of ischemic heart disease and other diseases of the circulatory system: Secondary | ICD-10-CM | POA: Insufficient documentation

## 2016-07-23 DIAGNOSIS — D631 Anemia in chronic kidney disease: Secondary | ICD-10-CM | POA: Diagnosis not present

## 2016-07-23 DIAGNOSIS — Z888 Allergy status to other drugs, medicaments and biological substances status: Secondary | ICD-10-CM | POA: Insufficient documentation

## 2016-07-23 DIAGNOSIS — Y939 Activity, unspecified: Secondary | ICD-10-CM | POA: Insufficient documentation

## 2016-07-23 DIAGNOSIS — D696 Thrombocytopenia, unspecified: Secondary | ICD-10-CM | POA: Insufficient documentation

## 2016-07-23 DIAGNOSIS — I120 Hypertensive chronic kidney disease with stage 5 chronic kidney disease or end stage renal disease: Secondary | ICD-10-CM | POA: Insufficient documentation

## 2016-07-23 DIAGNOSIS — Z86010 Personal history of colonic polyps: Secondary | ICD-10-CM | POA: Diagnosis not present

## 2016-07-23 DIAGNOSIS — Z7982 Long term (current) use of aspirin: Secondary | ICD-10-CM | POA: Insufficient documentation

## 2016-07-23 DIAGNOSIS — Z800 Family history of malignant neoplasm of digestive organs: Secondary | ICD-10-CM | POA: Diagnosis not present

## 2016-07-23 DIAGNOSIS — Z992 Dependence on renal dialysis: Secondary | ICD-10-CM

## 2016-07-23 DIAGNOSIS — Z825 Family history of asthma and other chronic lower respiratory diseases: Secondary | ICD-10-CM | POA: Insufficient documentation

## 2016-07-23 DIAGNOSIS — Z801 Family history of malignant neoplasm of trachea, bronchus and lung: Secondary | ICD-10-CM | POA: Insufficient documentation

## 2016-07-23 DIAGNOSIS — B182 Chronic viral hepatitis C: Secondary | ICD-10-CM | POA: Diagnosis not present

## 2016-07-23 DIAGNOSIS — F17210 Nicotine dependence, cigarettes, uncomplicated: Secondary | ICD-10-CM | POA: Diagnosis not present

## 2016-07-23 DIAGNOSIS — D509 Iron deficiency anemia, unspecified: Secondary | ICD-10-CM | POA: Diagnosis not present

## 2016-07-23 DIAGNOSIS — Z8489 Family history of other specified conditions: Secondary | ICD-10-CM | POA: Insufficient documentation

## 2016-07-23 LAB — CBC
HCT: 27.3 % — ABNORMAL LOW (ref 39.0–52.0)
Hemoglobin: 9.3 g/dL — ABNORMAL LOW (ref 13.0–17.0)
MCH: 28.3 pg (ref 26.0–34.0)
MCHC: 34.1 g/dL (ref 30.0–36.0)
MCV: 83 fL (ref 78.0–100.0)
Platelets: 51 10*3/uL — ABNORMAL LOW (ref 150–400)
RBC: 3.29 MIL/uL — ABNORMAL LOW (ref 4.22–5.81)
RDW: 18.8 % — ABNORMAL HIGH (ref 11.5–15.5)
WBC: 6 10*3/uL (ref 4.0–10.5)

## 2016-07-23 LAB — HEPATITIS A ANTIBODY, TOTAL: Hep A Total Ab: REACTIVE — AB

## 2016-07-23 LAB — PROTIME-INR
INR: 1.2 — ABNORMAL HIGH
INR: 1.22
Prothrombin Time: 12.7 s — ABNORMAL HIGH (ref 9.0–11.5)
Prothrombin Time: 15.5 seconds — ABNORMAL HIGH (ref 11.4–15.2)

## 2016-07-23 LAB — BASIC METABOLIC PANEL
Anion gap: 13 (ref 5–15)
BUN: 31 mg/dL — ABNORMAL HIGH (ref 6–20)
CO2: 31 mmol/L (ref 22–32)
Calcium: 9.6 mg/dL (ref 8.9–10.3)
Chloride: 92 mmol/L — ABNORMAL LOW (ref 101–111)
Creatinine, Ser: 6.06 mg/dL — ABNORMAL HIGH (ref 0.61–1.24)
GFR calc Af Amer: 11 mL/min — ABNORMAL LOW (ref >60)
GFR calc non Af Amer: 10 mL/min — ABNORMAL LOW (ref >60)
Glucose, Bld: 149 mg/dL — ABNORMAL HIGH (ref 65–99)
Potassium: 3.2 mmol/L — ABNORMAL LOW (ref 3.5–5.1)
Sodium: 136 mmol/L (ref 135–145)

## 2016-07-23 LAB — HEPATITIS B SURFACE ANTIGEN: Hepatitis B Surface Ag: POSITIVE — AB

## 2016-07-23 LAB — HEPATITIS B SURF AG CONFIRMATION: Hepatitis B Surf Ag Confirmation: POSITIVE — AB

## 2016-07-23 MED ORDER — MORPHINE SULFATE (PF) 2 MG/ML IV SOLN: 1.0000 mg | INTRAVENOUS | Status: DC | PRN

## 2016-07-23 MED ORDER — SEVELAMER CARBONATE 800 MG PO TABS: 1600.0000 mg | ORAL_TABLET | ORAL | Status: DC

## 2016-07-23 MED ORDER — BISACODYL 10 MG RE SUPP: 10.0000 mg | Freq: Every day | RECTAL | Status: DC | PRN

## 2016-07-23 MED ORDER — ACETAMINOPHEN 325 MG PO TABS: 325.0000 mg | ORAL_TABLET | ORAL | Status: DC | PRN

## 2016-07-23 MED ORDER — ASPIRIN EC 81 MG PO TBEC
81.0000 mg | DELAYED_RELEASE_TABLET | Freq: Every day | ORAL | Status: DC
Start: 2016-07-23 — End: 2016-07-24

## 2016-07-23 MED ORDER — LISINOPRIL 40 MG PO TABS: 40.0000 mg | ORAL_TABLET | Freq: Every day | ORAL | Status: DC

## 2016-07-23 MED ORDER — PHENOL 1.4 % MT LIQD: 1.0000 | OROMUCOSAL | Status: DC | PRN

## 2016-07-23 MED ORDER — SEVELAMER CARBONATE 800 MG PO TABS: 3200.0000 mg | ORAL_TABLET | Freq: Three times a day (TID) | ORAL | Status: DC

## 2016-07-23 MED ORDER — TRAZODONE HCL 50 MG PO TABS: 50.0000 mg | ORAL_TABLET | Freq: Every evening | ORAL | Status: DC | PRN

## 2016-07-23 MED ORDER — POTASSIUM CHLORIDE CRYS ER 20 MEQ PO TBCR: 20.0000 meq | EXTENDED_RELEASE_TABLET | Freq: Once | ORAL | Status: DC

## 2016-07-23 MED ORDER — HEPARIN SODIUM (PORCINE) 5000 UNIT/ML IJ SOLN
5000.0000 [IU] | Freq: Three times a day (TID) | INTRAMUSCULAR | Status: DC
  Filled 2016-07-23: qty 1

## 2016-07-23 MED ORDER — CEPHALEXIN 500 MG PO CAPS
500.0000 mg | ORAL_CAPSULE | Freq: Two times a day (BID) | ORAL | Status: DC
  Administered 2016-07-23: 500 mg via ORAL
  Filled 2016-07-23: qty 1

## 2016-07-23 MED ORDER — LABETALOL HCL 5 MG/ML IV SOLN
10.0000 mg | INTRAVENOUS | Status: DC | PRN
  Administered 2016-07-23 (×2): 10 mg via INTRAVENOUS
  Filled 2016-07-23 (×3): qty 4

## 2016-07-23 MED ORDER — AMLODIPINE BESYLATE 10 MG PO TABS
10.0000 mg | ORAL_TABLET | Freq: Every day | ORAL | Status: DC
  Administered 2016-07-23: 10 mg via ORAL
  Filled 2016-07-23: qty 1

## 2016-07-23 MED ORDER — BISOPROLOL FUMARATE 5 MG PO TABS: 10.0000 mg | ORAL_TABLET | Freq: Every day | ORAL | Status: DC

## 2016-07-23 MED ORDER — PREDNISONE 20 MG PO TABS: 50.0000 mg | ORAL_TABLET | Freq: Every day | ORAL | Status: DC

## 2016-07-23 MED ORDER — SODIUM CHLORIDE 0.9% FLUSH
3.0000 mL | Freq: Two times a day (BID) | INTRAVENOUS | Status: DC
  Administered 2016-07-23: 3 mL via INTRAVENOUS

## 2016-07-23 MED ORDER — ACETAMINOPHEN 325 MG RE SUPP: 325.0000 mg | RECTAL | Status: DC | PRN

## 2016-07-23 MED ORDER — DEXTROSE 5 % IV SOLN
1.5000 g | INTRAVENOUS | Status: AC
  Administered 2016-07-24: 1.5 g via INTRAVENOUS
  Filled 2016-07-23 (×2): qty 1.5

## 2016-07-23 MED ORDER — SEVELAMER CARBONATE 800 MG PO TABS
1600.0000 mg | ORAL_TABLET | ORAL | Status: DC
  Administered 2016-07-23: 1600 mg via ORAL

## 2016-07-23 MED ORDER — CINACALCET HCL 30 MG PO TABS: 30.0000 mg | ORAL_TABLET | Freq: Every day | ORAL | Status: DC

## 2016-07-23 MED ORDER — MOMETASONE FURO-FORMOTEROL FUM 100-5 MCG/ACT IN AERO: 2.0000 | INHALATION_SPRAY | Freq: Two times a day (BID) | RESPIRATORY_TRACT | Status: DC | PRN

## 2016-07-23 MED ORDER — HYDRALAZINE HCL 20 MG/ML IJ SOLN: 5.0000 mg | INTRAMUSCULAR | Status: DC | PRN

## 2016-07-23 MED ORDER — POLYETHYLENE GLYCOL 3350 17 G PO PACK
17.0000 g | PACK | Freq: Every day | ORAL | Status: DC
  Administered 2016-07-23: 17 g via ORAL
  Filled 2016-07-23: qty 1

## 2016-07-23 MED ORDER — ONDANSETRON HCL 4 MG/2ML IJ SOLN: 4.0000 mg | Freq: Four times a day (QID) | INTRAMUSCULAR | Status: DC | PRN

## 2016-07-23 MED ORDER — METOPROLOL TARTRATE 5 MG/5ML IV SOLN: 2.0000 mg | INTRAVENOUS | Status: DC | PRN

## 2016-07-23 MED ORDER — ALUM & MAG HYDROXIDE-SIMETH 200-200-20 MG/5ML PO SUSP
15.0000 mL | ORAL | Status: DC | PRN
  Administered 2016-07-23: 30 mL via ORAL
  Filled 2016-07-23: qty 30

## 2016-07-23 MED ORDER — PANTOPRAZOLE SODIUM 40 MG PO TBEC: 40.0000 mg | DELAYED_RELEASE_TABLET | Freq: Every day | ORAL | Status: DC

## 2016-07-23 MED ORDER — OXYCODONE-ACETAMINOPHEN 5-325 MG PO TABS
1.0000 | ORAL_TABLET | ORAL | Status: DC | PRN
  Administered 2016-07-23 – 2016-07-24 (×2): 2 via ORAL
  Filled 2016-07-23 (×2): qty 2

## 2016-07-23 MED ORDER — GUAIFENESIN-DM 100-10 MG/5ML PO SYRP: 15.0000 mL | ORAL_SOLUTION | ORAL | Status: DC | PRN

## 2016-07-23 MED ORDER — ALBUTEROL SULFATE (2.5 MG/3ML) 0.083% IN NEBU: 3.0000 mL | INHALATION_SOLUTION | Freq: Four times a day (QID) | RESPIRATORY_TRACT | Status: DC | PRN

## 2016-07-23 MED ORDER — SODIUM CHLORIDE 0.9% FLUSH: 3.0000 mL | INTRAVENOUS | Status: DC | PRN

## 2016-07-23 MED ORDER — SODIUM CHLORIDE 0.9 % IV SOLN
250.0000 mL | INTRAVENOUS | Status: DC | PRN
  Administered 2016-07-24: 08:00:00 via INTRAVENOUS

## 2016-07-23 NOTE — H&P (Signed)
HPI: [name] is a 56 y.o. male who is an add-on patient today with concerns over an ulcer on his left arm fistula.  He states this has been repaired in the past but it has come back.  He cannot sleep at night because he is afraid it is going to burst.  His nerves he describes are a rack and he is shaking continuously.  He did eat this morning.  He does not know how long this has been present.  The patient has hepatitis B and C.  His renal failure secondary to hypertension.      Past Medical History:  Diagnosis Date  . Adenomatous colon polyp    tubular  . Anemia   . Anxiety   . Chest pain    DATE UNKNOWN, C/O PERIODICALLY  . Chronic kidney disease    dialysis - M/W/F  . Dialysis patient ([facility])    Monday-Wednesday-Friday  . GERD (gastroesophageal reflux disease)    DATE UNKNOWN  . Hemorrhoids   . Hepatitis B, chronic ([facility] [place])   . Hepatitis C   . Hyperkalemia   . Hypertension   . Metabolic bone disease    Patient denies  . Pneumonia   . Pulmonary edema   . Renal disorder   . Renal insufficiency   . Shortness of breath dyspnea    " for the last past year with this dialysis"         Family History  Problem Relation Age of Onset  . Heart disease Mother   . Lung cancer Mother   . Heart disease Father   . Malignant hyperthermia Father   . Throat cancer Sister   . Hypertension Other   . COPD Other   . Colon cancer Neg Hx     SOCIAL HISTORY:       Social History  Substance Use Topics  . Smoking status: Current Every Day Smoker    Packs/day: 0.04    Years: 37.00    Types: Cigarettes  . Smokeless tobacco: Never Used     Comment: 3  . Alcohol use No     Comment: quit    Allergies  Allergen Reactions  . Aspirin Other (See Comments)    STOMACH PAIN  . Clonidine Derivatives Itching  . Tramadol Itching          Current Outpatient Prescriptions  Medication Sig Dispense Refill  . albuterol  (PROVENTIL HFA;VENTOLIN HFA) 108 (90 Base) MCG/ACT inhaler Inhale 1-2 puffs into the lungs every 6 (six) hours as needed for wheezing or shortness of breath. 1 Inhaler 0  . amLODipine (NORVASC) 10 MG tablet Take 10 mg by mouth at bedtime.     Marland Kitchen aspirin EC 81 MG tablet Take 81 mg by mouth daily.    . bisoprolol (ZEBETA) 10 MG tablet Take 10 mg by mouth daily.    . cephALEXin (KEFLEX) 500 MG capsule Take 1 capsule (500 mg total) by mouth 2 (two) times daily. 10 capsule 0  . cinacalcet (SENSIPAR) 30 MG tablet Take 30 mg by mouth daily.    . DULERA 100-5 MCG/ACT AERO Inhale 2 puffs into the lungs 2 (two) times daily as needed for wheezing or shortness of breath.     . lisinopril (PRINIVIL,ZESTRIL) 20 MG tablet Take 40 mg by mouth daily.    . polyethylene glycol powder (GLYCOLAX/MIRALAX) powder MIX 17 GM WITH 8 OUNCES OF LIQUID AND TAKE BY MOUTH DAILY 527 g 0  . predniSONE (DELTASONE) 50 MG  tablet Take 1 tablet daily with breakfast 5 tablet 0  . sevelamer carbonate (RENVELA) 800 MG tablet Take 1,600-3,200 mg by mouth See admin instructions. Take 3200 mg by mouth 3 times daily with meals and take 1600 mg by mouth with snacks.    . traZODone (DESYREL) 50 MG tablet Take 50-100 mg by mouth at bedtime as needed for sleep.     No current facility-administered medications for this visit.     REVIEW OF SYSTEMS:  [X]  denotes positive finding, [ ]  denotes negative finding Cardiac  Comments:  Chest pain or chest pressure:    Shortness of breath upon exertion:    Short of breath when lying flat:    Irregular heart rhythm:        Vascular    Pain in calf, thigh, or hip brought on by ambulation:    Pain in feet at night that wakes you up from your sleep:     Blood clot in your veins:    Leg swelling:         Pulmonary    Oxygen at home:    Productive cough:     Wheezing:         Neurologic    Sudden weakness in arms or legs:     Sudden  numbness in arms or legs:     Sudden onset of difficulty speaking or slurred speech:    Temporary loss of vision in one eye:     Problems with dizziness:         Gastrointestinal    Blood in stool:     Vomited blood:         Genitourinary    Burning when urinating:     Blood in urine:        Psychiatric    Major depression:         Hematologic    Bleeding problems:    Problems with blood clotting too easily:        Skin    Rashes or ulcers: x       Constitutional    Fever or chills:      PHYSICAL EXAM:    Vitals:   07/23/16 1459  BP: (!) 216/94  Pulse: 84  Resp: 16  Temp: 97.7 F (36.5 C)  TempSrc: Oral  SpO2: 97%  Weight: 145 lb (65.8 kg)  Height: 5\' 9"  (1.753 m)    GENERAL: The patient is a well-nourished male, in no acute distress. The vital signs are documented above. CARDIAC: There is a regular rate and rhythm.  VASCULAR: Large ulcer on the aneurysmal segment of the proximal left brachiocephalic fistula.  There is thinning of the skin. PULMONARY: Non-labored respirations ABDOMEN: Soft and non-tender with normal pitched bowel sounds.  MUSCULOSKELETAL: There are no major deformities or cyanosis. NEUROLOGIC: No focal weakness or paresthesias are detected. SKIN: There are no ulcers or rashes noted. PSYCHIATRIC: The patient has a normal affect.  A/P: 56yo male with esrd and 1cm ulceration overlying left arm avf. Will repair in a.m. Patient understands risks of significant bleeding and possibility of losing access and requiring catheter for dialysis.   Nyx Keady C. Donzetta Matters, MD [facility] Office: [phone] Pager: [phone]

## 2016-07-23 NOTE — Progress Notes (Signed)
[facility] [place]  Patient name: [name] MRN: [id number] DOB: Feb 04, 1961 Sex: male  REASON FOR VISIT: Left arm fistula ulcer  HPI: [name] is a 56 y.o. male who is an add-on patient today with concerns over an ulcer on his left arm fistula.  He states this has been repaired in the past but it has come back.  He cannot sleep at night because he is afraid it is going to burst.  His nerves he describes are a rack and he is shaking continuously.  He did eat this morning.  He does not know how long this has been present.  The patient has hepatitis B and C.  His renal failure secondary to hypertension.  Past Medical History:  Diagnosis Date  . Adenomatous colon polyp    tubular  . Anemia   . Anxiety   . Chest pain    DATE UNKNOWN, C/O PERIODICALLY  . Chronic kidney disease    dialysis - M/W/F  . Dialysis patient ([facility])    Monday-Wednesday-Friday  . GERD (gastroesophageal reflux disease)    DATE UNKNOWN  . Hemorrhoids   . Hepatitis B, chronic ([place])   . Hepatitis C   . Hyperkalemia   . Hypertension   . Metabolic bone disease    Patient denies  . Pneumonia   . Pulmonary edema   . Renal disorder   . Renal insufficiency   . Shortness of breath dyspnea    " for the last past year with this dialysis"    Family History  Problem Relation Age of Onset  . Heart disease Mother   . Lung cancer Mother   . Heart disease Father   . Malignant hyperthermia Father   . Throat cancer Sister   . Hypertension Other   . COPD Other   . Colon cancer Neg Hx     SOCIAL HISTORY: Social History  Substance Use Topics  . Smoking status: Current Every Day Smoker    Packs/day: 0.04    Years: 37.00    Types: Cigarettes  . Smokeless tobacco: Never Used     Comment: 3  . Alcohol use No     Comment: quit    Allergies  Allergen Reactions  . Aspirin Other (See Comments)    STOMACH PAIN  . Clonidine Derivatives Itching   . Tramadol Itching    Current Outpatient Prescriptions  Medication Sig Dispense Refill  . albuterol (PROVENTIL HFA;VENTOLIN HFA) 108 (90 Base) MCG/ACT inhaler Inhale 1-2 puffs into the lungs every 6 (six) hours as needed for wheezing or shortness of breath. 1 Inhaler 0  . amLODipine (NORVASC) 10 MG tablet Take 10 mg by mouth at bedtime.     Marland Kitchen aspirin EC 81 MG tablet Take 81 mg by mouth daily.    . bisoprolol (ZEBETA) 10 MG tablet Take 10 mg by mouth daily.    . cephALEXin (KEFLEX) 500 MG capsule Take 1 capsule (500 mg total) by mouth 2 (two) times daily. 10 capsule 0  . cinacalcet (SENSIPAR) 30 MG tablet Take 30 mg by mouth daily.    . DULERA 100-5 MCG/ACT AERO Inhale 2 puffs into the lungs 2 (two) times daily as needed for wheezing or shortness of breath.     . lisinopril (PRINIVIL,ZESTRIL) 20 MG tablet Take 40 mg by mouth daily.    . polyethylene glycol powder (GLYCOLAX/MIRALAX) powder MIX 17 GM WITH 8 OUNCES OF LIQUID AND TAKE BY MOUTH DAILY 527 g 0  .  predniSONE (DELTASONE) 50 MG tablet Take 1 tablet daily with breakfast 5 tablet 0  . sevelamer carbonate (RENVELA) 800 MG tablet Take 1,600-3,200 mg by mouth See admin instructions. Take 3200 mg by mouth 3 times daily with meals and take 1600 mg by mouth with snacks.    . traZODone (DESYREL) 50 MG tablet Take 50-100 mg by mouth at bedtime as needed for sleep.     No current facility-administered medications for this visit.     REVIEW OF SYSTEMS:  [X]  denotes positive finding, [ ]  denotes negative finding Cardiac  Comments:  Chest pain or chest pressure:    Shortness of breath upon exertion:    Short of breath when lying flat:    Irregular heart rhythm:        Vascular    Pain in calf, thigh, or hip brought on by ambulation:    Pain in feet at night that wakes you up from your sleep:     Blood clot in your veins:    Leg swelling:         Pulmonary    Oxygen at home:    Productive cough:     Wheezing:         Neurologic      Sudden weakness in arms or legs:     Sudden numbness in arms or legs:     Sudden onset of difficulty speaking or slurred speech:    Temporary loss of vision in one eye:     Problems with dizziness:         Gastrointestinal    Blood in stool:     Vomited blood:         Genitourinary    Burning when urinating:     Blood in urine:        Psychiatric    Major depression:         Hematologic    Bleeding problems:    Problems with blood clotting too easily:        Skin    Rashes or ulcers: x       Constitutional    Fever or chills:      PHYSICAL EXAM: Vitals:   07/23/16 1459  BP: (!) 216/94  Pulse: 84  Resp: 16  Temp: 97.7 F (36.5 C)  TempSrc: Oral  SpO2: 97%  Weight: 145 lb (65.8 kg)  Height: 5\' 9"  (1.753 m)    GENERAL: The patient is a well-nourished male, in no acute distress. The vital signs are documented above. CARDIAC: There is a regular rate and rhythm.  VASCULAR: Large ulcer on the aneurysmal segment of the proximal left brachiocephalic fistula.  There is thinning of the skin. PULMONARY: Non-labored respirations ABDOMEN: Soft and non-tender with normal pitched bowel sounds.  MUSCULOSKELETAL: There are no major deformities or cyanosis. NEUROLOGIC: No focal weakness or paresthesias are detected. SKIN: There are no ulcers or rashes noted. PSYCHIATRIC: The patient has a normal affect.  DATA:  None  MEDICAL ISSUES: 1 cm ulcer on the left brachiocephalic fistula.  The patient is extremely concerned that this is going to burst.  He wants it fixed right away.  He is unable to sleep or do anything because of how concerned he is over this.  Because he ate recently, he would not be a candidate to have this done this afternoon and therefore he will be put on the schedule for Dr. Gwenlyn Saran tomorrow.  The patient is going to be admitted for observation with  plans for surgery tomorrow.  He'll be nothing by mouth after midnight.    Annamarie Major, MD [facility] [phone] Pager [phone]

## 2016-07-23 NOTE — Telephone Encounter (Signed)
Request by Zandra Abts for appt. Today, due to a large scab on pt's access.  Appt. Given for 3:15 PM today; nurse accepted appt. For pt.

## 2016-07-24 ENCOUNTER — Encounter (HOSPITAL_COMMUNITY)
Admission: AD | Disposition: A | Discharge status: Home / Self Care | Payer: Self-pay | Source: Ambulatory Visit | Attending: Vascular Surgery

## 2016-07-24 ENCOUNTER — Encounter (HOSPITAL_COMMUNITY): Payer: Self-pay | Admitting: Certified Registered Nurse Anesthetist

## 2016-07-24 ENCOUNTER — Observation Stay (HOSPITAL_COMMUNITY): Payer: Medicare Other | Admitting: Certified Registered Nurse Anesthetist

## 2016-07-24 DIAGNOSIS — F419 Anxiety disorder, unspecified: Secondary | ICD-10-CM | POA: Diagnosis not present

## 2016-07-24 DIAGNOSIS — N186 End stage renal disease: Secondary | ICD-10-CM | POA: Diagnosis not present

## 2016-07-24 DIAGNOSIS — N185 Chronic kidney disease, stage 5: Secondary | ICD-10-CM | POA: Diagnosis not present

## 2016-07-24 DIAGNOSIS — B181 Chronic viral hepatitis B without delta-agent: Secondary | ICD-10-CM | POA: Diagnosis not present

## 2016-07-24 DIAGNOSIS — D631 Anemia in chronic kidney disease: Secondary | ICD-10-CM | POA: Diagnosis not present

## 2016-07-24 DIAGNOSIS — I770 Arteriovenous fistula, acquired: Secondary | ICD-10-CM | POA: Diagnosis not present

## 2016-07-24 DIAGNOSIS — B182 Chronic viral hepatitis C: Secondary | ICD-10-CM | POA: Diagnosis not present

## 2016-07-24 DIAGNOSIS — Z992 Dependence on renal dialysis: Secondary | ICD-10-CM | POA: Diagnosis not present

## 2016-07-24 DIAGNOSIS — T82898A Other specified complication of vascular prosthetic devices, implants and grafts, initial encounter: Secondary | ICD-10-CM | POA: Diagnosis not present

## 2016-07-24 DIAGNOSIS — Z86010 Personal history of colonic polyps: Secondary | ICD-10-CM | POA: Diagnosis not present

## 2016-07-24 DIAGNOSIS — K219 Gastro-esophageal reflux disease without esophagitis: Secondary | ICD-10-CM | POA: Diagnosis not present

## 2016-07-24 DIAGNOSIS — E875 Hyperkalemia: Secondary | ICD-10-CM | POA: Diagnosis not present

## 2016-07-24 DIAGNOSIS — L98491 Non-pressure chronic ulcer of skin of other sites limited to breakdown of skin: Secondary | ICD-10-CM | POA: Diagnosis not present

## 2016-07-24 DIAGNOSIS — I120 Hypertensive chronic kidney disease with stage 5 chronic kidney disease or end stage renal disease: Secondary | ICD-10-CM | POA: Diagnosis not present

## 2016-07-24 HISTORY — PX: REVISON OF ARTERIOVENOUS FISTULA: SHX6074

## 2016-07-24 LAB — CBC
HCT: 28 % — ABNORMAL LOW (ref 39.0–52.0)
Hemoglobin: 9.6 g/dL — ABNORMAL LOW (ref 13.0–17.0)
MCH: 28.7 pg (ref 26.0–34.0)
MCHC: 34.3 g/dL (ref 30.0–36.0)
MCV: 83.6 fL (ref 78.0–100.0)
Platelets: 52 10*3/uL — ABNORMAL LOW (ref 150–400)
RBC: 3.35 MIL/uL — ABNORMAL LOW (ref 4.22–5.81)
RDW: 18.9 % — ABNORMAL HIGH (ref 11.5–15.5)
WBC: 6.1 10*3/uL (ref 4.0–10.5)

## 2016-07-24 LAB — LIVER FIBROSIS, FIBROTEST-ACTITEST
ALT: 39 U/L (ref 9–46)
Alpha-2-Macroglobulin: 302 mg/dL — ABNORMAL HIGH (ref 106–279)
Apolipoprotein A1: 113 mg/dL (ref 94–176)
Bilirubin: 1.6 mg/dL — ABNORMAL HIGH (ref 0.2–1.2)
Fibrosis Score: 0.93
GGT: 281 U/L — ABNORMAL HIGH (ref 3–95)
Haptoglobin: 40 mg/dL — ABNORMAL LOW (ref 43–212)
Necroinflammat ACT Score: 0.41
Reference ID: 1771885

## 2016-07-24 LAB — BASIC METABOLIC PANEL
Anion gap: 13 (ref 5–15)
BUN: 34 mg/dL — ABNORMAL HIGH (ref 6–20)
CO2: 28 mmol/L (ref 22–32)
Calcium: 9.6 mg/dL (ref 8.9–10.3)
Chloride: 93 mmol/L — ABNORMAL LOW (ref 101–111)
Creatinine, Ser: 6.75 mg/dL — ABNORMAL HIGH (ref 0.61–1.24)
GFR calc Af Amer: 10 mL/min — ABNORMAL LOW (ref >60)
GFR calc non Af Amer: 8 mL/min — ABNORMAL LOW (ref >60)
Glucose, Bld: 100 mg/dL — ABNORMAL HIGH (ref 65–99)
Potassium: 4 mmol/L (ref 3.5–5.1)
Sodium: 134 mmol/L — ABNORMAL LOW (ref 135–145)

## 2016-07-24 SURGERY — REVISON OF ARTERIOVENOUS FISTULA
Anesthesia: General | Site: Arm Upper | Laterality: Left

## 2016-07-24 MED ORDER — ONDANSETRON HCL 4 MG/2ML IJ SOLN
INTRAMUSCULAR | Status: AC
  Filled 2016-07-24: qty 2

## 2016-07-24 MED ORDER — LIDOCAINE HCL (CARDIAC) 20 MG/ML IV SOLN
INTRAVENOUS | Status: DC | PRN
  Administered 2016-07-24: 40 mg via INTRAVENOUS

## 2016-07-24 MED ORDER — PHENYLEPHRINE 40 MCG/ML (10ML) SYRINGE FOR IV PUSH (FOR BLOOD PRESSURE SUPPORT)
PREFILLED_SYRINGE | INTRAVENOUS | Status: AC
  Filled 2016-07-24: qty 10

## 2016-07-24 MED ORDER — HEMOSTATIC AGENTS (NO CHARGE) OPTIME: TOPICAL | Status: DC | PRN

## 2016-07-24 MED ORDER — ONDANSETRON HCL 4 MG/2ML IJ SOLN
INTRAMUSCULAR | Status: DC | PRN
  Administered 2016-07-24: 4 mg via INTRAVENOUS

## 2016-07-24 MED ORDER — FENTANYL CITRATE (PF) 100 MCG/2ML IJ SOLN
INTRAMUSCULAR | Status: DC | PRN
  Administered 2016-07-24: 25 ug via INTRAVENOUS
  Administered 2016-07-24: 50 ug via INTRAVENOUS
  Administered 2016-07-24: 100 ug via INTRAVENOUS
  Administered 2016-07-24: 25 ug via INTRAVENOUS

## 2016-07-24 MED ORDER — 0.9 % SODIUM CHLORIDE (POUR BTL) OPTIME
TOPICAL | Status: DC | PRN
  Administered 2016-07-24: 1000 mL

## 2016-07-24 MED ORDER — PROPOFOL 10 MG/ML IV BOLUS
INTRAVENOUS | Status: AC
  Filled 2016-07-24: qty 20

## 2016-07-24 MED ORDER — OXYCODONE-ACETAMINOPHEN 5-325 MG PO TABS: 1.0000 | ORAL_TABLET | ORAL | 0 refills | Status: DC | PRN

## 2016-07-24 MED ORDER — GLECAPREVIR-PIBRENTASVIR 100-40 MG PO TABS: 3.0000 | ORAL_TABLET | Freq: Every day | ORAL | 1 refills | Status: DC

## 2016-07-24 MED ORDER — PROPOFOL 10 MG/ML IV BOLUS
INTRAVENOUS | Status: DC | PRN
  Administered 2016-07-24: 100 mg via INTRAVENOUS

## 2016-07-24 MED ORDER — SUCCINYLCHOLINE CHLORIDE 20 MG/ML IJ SOLN
INTRAMUSCULAR | Status: DC | PRN
  Administered 2016-07-24: 100 mg via INTRAVENOUS

## 2016-07-24 MED ORDER — IOPAMIDOL (ISOVUE-300) INJECTION 61%
INTRAVENOUS | Status: AC
  Filled 2016-07-24: qty 50

## 2016-07-24 MED ORDER — HEPARIN SODIUM (PORCINE) 1000 UNIT/ML IJ SOLN
INTRAMUSCULAR | Status: AC
  Filled 2016-07-24: qty 1

## 2016-07-24 MED ORDER — ONDANSETRON HCL 4 MG/2ML IJ SOLN: 4.0000 mg | Freq: Once | INTRAMUSCULAR | Status: DC | PRN

## 2016-07-24 MED ORDER — SODIUM CHLORIDE 0.9 % IV SOLN
INTRAVENOUS | Status: DC | PRN
  Administered 2016-07-24: 500 mL

## 2016-07-24 MED ORDER — FENTANYL CITRATE (PF) 250 MCG/5ML IJ SOLN
INTRAMUSCULAR | Status: AC
  Filled 2016-07-24: qty 5

## 2016-07-24 MED ORDER — FENTANYL CITRATE (PF) 100 MCG/2ML IJ SOLN: 25.0000 ug | INTRAMUSCULAR | Status: DC | PRN

## 2016-07-24 SURGICAL SUPPLY — 35 items
ADH SKN CLS APL DERMABOND .7 (GAUZE/BANDAGES/DRESSINGS) ×1
ARMBAND PINK RESTRICT EXTREMIT (MISCELLANEOUS) ×2 IMPLANT
BNDG CMPR 9X4 STRL LF SNTH (GAUZE/BANDAGES/DRESSINGS) ×1
BNDG ESMARK 4X9 LF (GAUZE/BANDAGES/DRESSINGS) ×1 IMPLANT
CANISTER SUCTION 2500CC (MISCELLANEOUS) ×2 IMPLANT
CLIP TI MEDIUM 6 (CLIP) ×2 IMPLANT
CLIP TI WIDE RED SMALL 6 (CLIP) ×2 IMPLANT
COVER PROBE W GEL 5X96 (DRAPES) ×1 IMPLANT
CUFF TOURNIQUET SINGLE 18IN (TOURNIQUET CUFF) ×1 IMPLANT
DERMABOND ADVANCED (GAUZE/BANDAGES/DRESSINGS) ×1
DERMABOND ADVANCED .7 DNX12 (GAUZE/BANDAGES/DRESSINGS) ×1 IMPLANT
ELECT REM PT RETURN 9FT ADLT (ELECTROSURGICAL) ×2
ELECTRODE REM PT RTRN 9FT ADLT (ELECTROSURGICAL) ×1 IMPLANT
GLOVE BIO SURGEON STRL SZ7.5 (GLOVE) ×2 IMPLANT
GLOVE BIOGEL PI IND STRL 6.5 (GLOVE) IMPLANT
GLOVE BIOGEL PI IND STRL 7.5 (GLOVE) IMPLANT
GLOVE BIOGEL PI INDICATOR 6.5 (GLOVE) ×1
GLOVE BIOGEL PI INDICATOR 7.5 (GLOVE) ×1
GLOVE SURG SS PI 7.5 STRL IVOR (GLOVE) ×1 IMPLANT
GOWN STRL REUS W/ TWL LRG LVL3 (GOWN DISPOSABLE) ×2 IMPLANT
GOWN STRL REUS W/ TWL XL LVL3 (GOWN DISPOSABLE) ×1 IMPLANT
GOWN STRL REUS W/TWL LRG LVL3 (GOWN DISPOSABLE) ×4
GOWN STRL REUS W/TWL XL LVL3 (GOWN DISPOSABLE) ×2
KIT BASIN OR (CUSTOM PROCEDURE TRAY) ×2 IMPLANT
KIT ROOM TURNOVER OR (KITS) ×2 IMPLANT
NS IRRIG 1000ML POUR BTL (IV SOLUTION) ×2 IMPLANT
PACK CV ACCESS (CUSTOM PROCEDURE TRAY) ×2 IMPLANT
PAD ARMBOARD 7.5X6 YLW CONV (MISCELLANEOUS) ×4 IMPLANT
SUT MNCRL AB 4-0 PS2 18 (SUTURE) ×2 IMPLANT
SUT PROLENE 5 0 C 1 24 (SUTURE) ×1 IMPLANT
SUT PROLENE 6 0 BV (SUTURE) ×2 IMPLANT
SUT VIC AB 3-0 SH 27 (SUTURE) ×2
SUT VIC AB 3-0 SH 27X BRD (SUTURE) ×1 IMPLANT
UNDERPAD 30X30 (UNDERPADS AND DIAPERS) ×2 IMPLANT
WATER STERILE IRR 1000ML POUR (IV SOLUTION) ×2 IMPLANT

## 2016-07-24 NOTE — Op Note (Signed)
    Patient name: [name] MRN: [id number] DOB: [birth year] Sex: male  07/24/2016 Pre-operative Diagnosis: 1. esrd 2. Ulceration of left arm av fistula Post-operative diagnosis:  Same Surgeon:  Eda Paschal. Donzetta Matters, MD Assistant: OR nurse Procedure Performed: 1.  Excision of skin ulceration 2.  Plication of left brachiocephalic av fistula  Indications:  56 year-old male with a left upper arm AV fistula recently underwent plication of this fistula. He now has an area of ulceration that is 1 cm in size very concerning for impending bleed as indicated for the above procedure.  Findings: There was 1 cm ulceration overlying proximal aspect of the fistula. Skin was dissected and excised the fistula was plicated there was a strong thrill at completion.    Procedure:  The patient was identified in the holding area and taken to the operating room where he was placed supine on the operating table general anesthesia was induced he was sterilely prepped and draped in usual fashion timeout called. He had been given antibiotics prior to incision. We then made an elliptical incision overlying the ulcerative area. I dissected out proximally and distally to the ulcer to have controlled the fistula. I then excised the ulcerative skin without entering into the fistula. Fistula was then plicated with 5-0 Prolene suture. Skin flaps were raised around the fistula hemostasis obtained and the wound was closed with interrupted 3-0 Vicryl followed by 4-0 Monocryl at the level of the skin and Dermabond above that. Patient will be awakened from anesthesia he tolerated procedure very well without immediate complication.    Chastin Riesgo C. Donzetta Matters, MD [facility] [place] Office: [phone] Pager: [phone]

## 2016-07-24 NOTE — Progress Notes (Signed)
D/c doc reviewed with patient, narco script given to patient.Staff transported patientt down to meet with friend for pickup.

## 2016-07-24 NOTE — Discharge Instructions (Signed)
° ° °  07/24/2016 LENFORD BEDDOW [id number] 08-18-1960  Surgeon(s): Waynetta Sandy, MD  Procedure(s): Plication of Left Upper Arm Fistula   x May stick graft on designated area only: Stick away from incision for 6 weeks

## 2016-07-24 NOTE — Addendum Note (Signed)
Addended by: Thayer Headings on: 07/24/2016 08:49 AM   Modules accepted: Orders

## 2016-07-24 NOTE — Anesthesia Procedure Notes (Signed)
Procedure Name: Intubation Date/Time: 07/24/2016 7:55 AM Performed by: Rejeana Brock L Pre-anesthesia Checklist: Patient identified, Emergency Drugs available, Suction available and Patient being monitored Patient Re-evaluated:Patient Re-evaluated prior to inductionOxygen Delivery Method: Circle System Utilized Preoxygenation: Pre-oxygenation with 100% oxygen Intubation Type: IV induction Ventilation: Mask ventilation without difficulty Laryngoscope Size: Mac and 4 Grade View: Grade I Tube type: Oral Tube size: 7.5 mm Number of attempts: 1 Airway Equipment and Method: Stylet and Oral airway Placement Confirmation: ETT inserted through vocal cords under direct vision,  positive ETCO2 and breath sounds checked- equal and bilateral Secured at: 23 cm Tube secured with: Tape Dental Injury: Teeth and Oropharynx as per pre-operative assessment

## 2016-07-24 NOTE — Anesthesia Preprocedure Evaluation (Addendum)
Anesthesia Evaluation  Patient identified by MRN, date of birth, ID band Patient awake    Reviewed: Allergy & Precautions, NPO status , Patient's Chart, lab work & pertinent test results  Airway Mallampati: II  TM Distance: >3 FB Neck ROM: Full    Dental  (+) Teeth Intact, Dental Advisory Given   Pulmonary Current Smoker,    breath sounds clear to auscultation       Cardiovascular hypertension,  Rhythm:Regular Rate:Normal     Neuro/Psych    GI/Hepatic   Endo/Other    Renal/GU      Musculoskeletal   Abdominal   Peds  Hematology   Anesthesia Other Findings   Reproductive/Obstetrics                             Anesthesia Physical Anesthesia Plan  ASA: III  Anesthesia Plan: General   Post-op Pain Management:    Induction: Intravenous  Airway Management Planned: LMA  Additional Equipment:   Intra-op Plan:   Post-operative Plan:   Informed Consent: I have reviewed the patients History and Physical, chart, labs and discussed the procedure including the risks, benefits and alternatives for the proposed anesthesia with the patient or authorized representative who has indicated his/her understanding and acceptance.   Dental advisory given  Plan Discussed with: CRNA and Anesthesiologist  Anesthesia Plan Comments:         Anesthesia Quick Evaluation

## 2016-07-24 NOTE — Progress Notes (Signed)
Pt states that he does not want meds this AM, he states that he will take them when he gets  home. Only needs pain meds-due to last pain meds administered, nurse was unable to administer another dose.

## 2016-07-24 NOTE — Progress Notes (Signed)
  Progress Note    07/24/2016 7:08 AM Day of Surgery  Subjective:  Stable overnight.  Vitals:   07/24/16 0012 07/24/16 0438  BP: (!) 196/98 (!) 194/90  Pulse: 83 82  Resp: 18 18  Temp: 99.4 F (37.4 C) 98.7 F (37.1 C)    Physical Exam: aaox3 Non labored breathing Sinus rhythm Left arm avf with 1cm ulcer  CBC    Component Value Date/Time   WBC 6.1 07/24/2016 0511   RBC 3.35 (L) 07/24/2016 0511   HGB 9.6 (L) 07/24/2016 0511   HCT 28.0 (L) 07/24/2016 0511   PLT 52 (L) 07/24/2016 0511   MCV 83.6 07/24/2016 0511   MCH 28.7 07/24/2016 0511   MCHC 34.3 07/24/2016 0511   RDW 18.9 (H) 07/24/2016 0511   LYMPHSABS 1.1 07/19/2016 2344   MONOABS 0.3 07/19/2016 2344   EOSABS 0.1 07/19/2016 2344   BASOSABS 0.0 07/19/2016 2344    BMET    Component Value Date/Time   NA 134 (L) 07/24/2016 0511   K 4.0 07/24/2016 0511   CL 93 (L) 07/24/2016 0511   CO2 28 07/24/2016 0511   GLUCOSE 100 (H) 07/24/2016 0511   BUN 34 (H) 07/24/2016 0511   CREATININE 6.75 (H) 07/24/2016 0511   CALCIUM 9.6 07/24/2016 0511   CALCIUM 8.1 (L) 07/11/2011 1117   GFRNONAA 8 (L) 07/24/2016 0511   GFRAA 10 (L) 07/24/2016 0511    INR    Component Value Date/Time   INR 1.22 07/23/2016 1902    No intake or output data in the 24 hours ending 07/24/16 0708   Assessment:  56 y.o. male is here with ulceration of left arm avf  Plan: OR today for revision of left arm avf with possible tdc   Sharica Roedel C. Donzetta Matters, MD [facility] [place] Office: [phone] Pager: [phone]  07/24/2016 7:08 AM

## 2016-07-24 NOTE — Transfer of Care (Signed)
Immediate Anesthesia Transfer of Care Note  Patient: [name]  Procedure(s) Performed: Procedure(s): Plication of Left Upper Arm Fistula  (Left)  Patient Location: PACU  Anesthesia Type:General  Level of Consciousness: awake  Airway & Oxygen Therapy: Patient Spontanous Breathing and Patient connected to face mask oxygen  Post-op Assessment: Report given to RN and Post -op Vital signs reviewed and stable  Post vital signs: Reviewed and stable  Last Vitals:  Vitals:   07/24/16 0012 07/24/16 0438  BP: (!) 196/98 (!) 194/90  Pulse: 83 82  Resp: 18 18  Temp: 37.4 C 37.1 C    Last Pain:  Vitals:   07/24/16 0438  TempSrc: Oral  PainSc:          Complications: No apparent anesthesia complications

## 2016-07-24 NOTE — Anesthesia Postprocedure Evaluation (Signed)
Anesthesia Post Note  Patient: [name]  Procedure(s) Performed: Procedure(s) (LRB): Plication of Left Upper Arm Fistula  (Left)  Patient location during evaluation: PACU Anesthesia Type: General Level of consciousness: awake, oriented and awake and alert Pain management: pain level controlled Vital Signs Assessment: post-procedure vital signs reviewed and stable Respiratory status: spontaneous breathing, respiratory function stable and nonlabored ventilation Cardiovascular status: blood pressure returned to baseline Anesthetic complications: no       Last Vitals:  Vitals:   07/24/16 0909 07/24/16 0921  BP: (!) 160/87 (!) 168/89  Pulse: 74 74  Resp: 15 (!) 21  Temp:  36.5 C    Last Pain:  Vitals:   07/24/16 1000  TempSrc:   PainSc: 0-No pain                 Duc Crocket COKER

## 2016-07-24 NOTE — Anesthesia Postprocedure Evaluation (Signed)
Anesthesia Post Note  Patient: [name]  Procedure(s) Performed: Procedure(s) (LRB): Plication of Left Upper Arm Fistula  (Left)  Patient location during evaluation: PACU Anesthesia Type: General Level of consciousness: awake, awake and alert and oriented Pain management: pain level controlled Vital Signs Assessment: post-procedure vital signs reviewed and stable Respiratory status: spontaneous breathing, nonlabored ventilation and respiratory function stable Cardiovascular status: blood pressure returned to baseline Anesthetic complications: no       Last Vitals:  Vitals:   07/24/16 0909 07/24/16 0921  BP: (!) 160/87 (!) 168/89  Pulse: 74 74  Resp: 15 (!) 21  Temp:  36.5 C    Last Pain:  Vitals:   07/24/16 1000  TempSrc:   PainSc: 0-No pain                 Cherisse Carrell COKER

## 2016-07-25 ENCOUNTER — Encounter (HOSPITAL_COMMUNITY): Payer: Self-pay | Admitting: Vascular Surgery

## 2016-07-26 ENCOUNTER — Telehealth: Payer: Self-pay | Admitting: Vascular Surgery

## 2016-07-26 DIAGNOSIS — B182 Chronic viral hepatitis C: Secondary | ICD-10-CM | POA: Diagnosis not present

## 2016-07-26 DIAGNOSIS — I10 Essential (primary) hypertension: Secondary | ICD-10-CM | POA: Diagnosis not present

## 2016-07-26 DIAGNOSIS — F419 Anxiety disorder, unspecified: Secondary | ICD-10-CM | POA: Diagnosis not present

## 2016-07-26 DIAGNOSIS — I429 Cardiomyopathy, unspecified: Secondary | ICD-10-CM | POA: Diagnosis not present

## 2016-07-26 DIAGNOSIS — Z720 Tobacco use: Secondary | ICD-10-CM | POA: Diagnosis not present

## 2016-07-26 DIAGNOSIS — N186 End stage renal disease: Secondary | ICD-10-CM | POA: Diagnosis not present

## 2016-07-26 DIAGNOSIS — B1910 Unspecified viral hepatitis B without hepatic coma: Secondary | ICD-10-CM | POA: Diagnosis not present

## 2016-07-26 DIAGNOSIS — D638 Anemia in other chronic diseases classified elsewhere: Secondary | ICD-10-CM | POA: Diagnosis not present

## 2016-07-26 DIAGNOSIS — D696 Thrombocytopenia, unspecified: Secondary | ICD-10-CM | POA: Diagnosis not present

## 2016-07-26 DIAGNOSIS — R52 Pain, unspecified: Secondary | ICD-10-CM | POA: Diagnosis not present

## 2016-07-26 DIAGNOSIS — J45909 Unspecified asthma, uncomplicated: Secondary | ICD-10-CM | POA: Diagnosis not present

## 2016-07-26 DIAGNOSIS — G4700 Insomnia, unspecified: Secondary | ICD-10-CM | POA: Diagnosis not present

## 2016-07-26 LAB — HEPATITIS B E ANTIGEN: Hepatitis Be Antigen: NONREACTIVE

## 2016-07-26 LAB — HEPATITIS B E ANTIBODY: Hepatitis Be Antibody: REACTIVE — AB

## 2016-07-26 LAB — HEPATITIS B DNA, ULTRAQUANTITATIVE, PCR
Hepatitis B DNA (Calc): <1.3 Log IU/mL (ref <1.30)
Hepatitis B DNA: <20 IU/mL (ref <20)

## 2016-07-26 NOTE — Telephone Encounter (Signed)
Sched appt 08/13/16 at 1:00 w/ PA. Spoke to pt to inform them of appt.

## 2016-07-26 NOTE — Discharge Summary (Signed)
[facility] Discharge Summary  [name] [birth year] 56 y.o. male  [id number]  Admission Date: 07/23/2016  Discharge Date: 07/24/2016  Physician: Servando Snare, MD  Admission Diagnosis: large scab over lt arm fistula End Stage Renal Disease N18.6; Left arm brachiocephalic arteriovenous fistula ulceration I72.9  HPI:  This is a 56 y.o. male who presented with concerns over an ulcer on his left arm fistula. He states this has been repaired in the past but it has come back. He cannot sleep at night because he is afraid it is going to burst. His nerves he describes are a rack and he is shaking continuously.He does not know how long this has been present.  The patient has hepatitis B and C. His renal failure secondary to hypertension.  Hospital Course:  The patient was admitted to the hospital on 07/23/2016 and was taken to the operating room on 07/24/2016 and underwent: excision of skin ulceration and plication of left brachiocephalic AV fistula.     The patient tolerated the procedure well and was transported to the PACU in stable condition.   The patient was discharge home following his procedure.    CBC    Component Value Date/Time   WBC 6.1 07/24/2016 0511   RBC 3.35 (L) 07/24/2016 0511   HGB 9.6 (L) 07/24/2016 0511   HCT 28.0 (L) 07/24/2016 0511   PLT 52 (L) 07/24/2016 0511   MCV 83.6 07/24/2016 0511   MCH 28.7 07/24/2016 0511   MCHC 34.3 07/24/2016 0511   RDW 18.9 (H) 07/24/2016 0511   LYMPHSABS 1.1 07/19/2016 2344   MONOABS 0.3 07/19/2016 2344   EOSABS 0.1 07/19/2016 2344   BASOSABS 0.0 07/19/2016 2344    BMET    Component Value Date/Time   NA 134 (L) 07/24/2016 0511   K 4.0 07/24/2016 0511   CL 93 (L) 07/24/2016 0511   CO2 28 07/24/2016 0511   GLUCOSE 100 (H) 07/24/2016 0511   BUN 34 (H) 07/24/2016 0511   CREATININE 6.75 (H) 07/24/2016 0511   CALCIUM 9.6 07/24/2016 0511   CALCIUM 8.1 (L) 07/11/2011 1117   GFRNONAA 8 (L)  07/24/2016 0511   GFRAA 10 (L) 07/24/2016 0511     Discharge Instructions:   The patient is discharged to home with extensive instructions on wound care and progressive ambulation.  They are instructed not to drive or perform any heavy lifting until returning to see the physician in his office.  Discharge Instructions    Activity as tolerated - No restrictions    Complete by:  As directed    Call MD for:  redness, tenderness, or signs of infection (pain, swelling, bleeding, redness, odor or green/yellow discharge around incision site)    Complete by:  As directed    Call MD for:  severe or increased pain, loss or decreased feeling  in affected limb(s)    Complete by:  As directed    Call MD for:  temperature >100.5    Complete by:  As directed    Discharge wound care:    Complete by:  As directed    Wash wound daily with soap and water and pat dry. Do not apply any creams or ointments on your incisions. Do not pull off the skin glue. It will peel off on its own.   Driving Restrictions    Complete by:  As directed    No driving while on pain medication   Resume previous diet    Complete by:  As  directed       Discharge Diagnosis:  large scab over lt arm fistula End Stage Renal Disease N18.6; Left arm brachiocephalic arteriovenous fistula ulceration I72.9  Secondary Diagnosis: Patient Active Problem List   Diagnosis Date Noted  . Problem with dialysis access ([place]) 07/23/2016  . End stage renal disease ([place]) 12/02/2015  . Chronic hepatitis B ([place]) 03/05/2014  . Chronic hepatitis C without hepatic coma ([place]) 03/05/2014  . Internal hemorrhoids with bleeding, swelling and itching 03/05/2014  . Thrombocytopenia, unspecified 03/05/2014  . Chest pain 02/27/2014  . ALCOHOL ABUSE 04/14/2009  . TOBACCO ABUSE 04/14/2009  . GANGLION CYST 04/14/2009   Past Medical History:  Diagnosis Date  . Adenomatous colon polyp    tubular  . Anemia   . Anxiety   . Chest pain    DATE UNKNOWN,  C/O PERIODICALLY  . Chronic kidney disease    dialysis - M/W/F  . Dialysis patient ([place])    Monday-Wednesday-Friday  . GERD (gastroesophageal reflux disease)    DATE UNKNOWN  . Hemorrhoids   . Hepatitis B, chronic ([place])   . Hepatitis C   . Hyperkalemia   . Hypertension   . Metabolic bone disease    Patient denies  . Pneumonia   . Pulmonary edema   . Renal disorder   . Renal insufficiency   . Shortness of breath dyspnea    " for the last past year with this dialysis"     Allergies as of 07/24/2016      Reactions   Aspirin Other (See Comments)   STOMACH PAIN   Clonidine Derivatives Itching   Tramadol Itching      Medication List    TAKE these medications   albuterol 108 (90 Base) MCG/ACT inhaler Commonly known as:  PROVENTIL HFA;VENTOLIN HFA Inhale 1-2 puffs into the lungs every 6 (six) hours as needed for wheezing or shortness of breath.   amLODipine 10 MG tablet Commonly known as:  NORVASC Take 10 mg by mouth at bedtime.   aspirin EC 81 MG tablet Take 81 mg by mouth daily.   bisoprolol 10 MG tablet Commonly known as:  ZEBETA Take 10 mg by mouth daily.   cinacalcet 30 MG tablet Commonly known as:  SENSIPAR Take 30 mg by mouth daily.   DULERA 100-5 MCG/ACT Aero Generic drug:  mometasone-formoterol Inhale 2 puffs into the lungs 2 (two) times daily as needed for wheezing or shortness of breath.   Glecaprevir-Pibrentasvir 100-40 MG Tabs Commonly known as:  MAVYRET Take 3 tablets by mouth daily.   lisinopril 20 MG tablet Commonly known as:  PRINIVIL,ZESTRIL Take 40 mg by mouth daily.   oxyCODONE-acetaminophen 5-325 MG tablet Commonly known as:  PERCOCET/ROXICET Take 1-2 tablets by mouth every 4 (four) hours as needed for moderate pain.   polyethylene glycol powder powder Commonly known as:  GLYCOLAX/MIRALAX MIX 17 GM WITH 8 OUNCES OF LIQUID AND TAKE BY MOUTH DAILY   predniSONE 50 MG tablet Commonly known as:  DELTASONE Take 1 tablet daily with  breakfast   sevelamer carbonate 800 MG tablet Commonly known as:  RENVELA Take 1,600-3,200 mg by mouth See admin instructions. Take 3200 mg by mouth 3 times daily with meals and take 1600 mg by mouth with snacks.   traZODone 50 MG tablet Commonly known as:  DESYREL Take 50-100 mg by mouth at bedtime as needed for sleep.     ASK your doctor about these medications   cephALEXin 500 MG capsule Commonly known as:  KEFLEX Take 1 capsule (500 mg total) by mouth 2 (two) times daily. Ask about: Should I take this medication?       Percocet #6 No Refill  Disposition: Home  Patient's condition: is Good  Follow up: 1. Dr. Donzetta Matters in 2 weeks   Virgina Jock, PA-C [facility] [phone] 07/26/2016  3:26 PM

## 2016-07-26 NOTE — Telephone Encounter (Signed)
-----   Message from Mena Goes, RN sent at 07/25/2016 12:29 AM EST ----- Regarding: schedule 2-3 weeks PA clinic   ----- Message ----- From: Alvia Grove, PA-C Sent: 07/24/2016   9:54 AM To: [facility] Charge Pool  S/p 1.  Excision of skin ulceration 2.  Plication of left brachiocephalic av fistula 2/94/76  F/u with PA in next 2-3 weeks when Dr. Donzetta Matters is in the office.   Thanks Maudie Mercury

## 2016-07-27 LAB — HCV RNA, QN PCR RFLX GENO, LIPA
HCV RNA, PCR, QN: 1551194 IU/mL — ABNORMAL HIGH (ref <15)
HCV RNA, PCR, QN: 6.19 log IU/mL — ABNORMAL HIGH (ref <1.18)

## 2016-07-27 LAB — HEPATITIS C GENOTYPE

## 2016-07-30 DIAGNOSIS — N2581 Secondary hyperparathyroidism of renal origin: Secondary | ICD-10-CM | POA: Diagnosis not present

## 2016-07-30 DIAGNOSIS — D509 Iron deficiency anemia, unspecified: Secondary | ICD-10-CM | POA: Diagnosis not present

## 2016-07-30 DIAGNOSIS — D631 Anemia in chronic kidney disease: Secondary | ICD-10-CM | POA: Diagnosis not present

## 2016-07-30 DIAGNOSIS — N186 End stage renal disease: Secondary | ICD-10-CM | POA: Diagnosis not present

## 2016-08-02 ENCOUNTER — Other Ambulatory Visit: Payer: Self-pay | Admitting: Pharmacist Clinician (PhC)/ Clinical Pharmacy Specialist

## 2016-08-02 DIAGNOSIS — N186 End stage renal disease: Secondary | ICD-10-CM | POA: Diagnosis not present

## 2016-08-02 DIAGNOSIS — N2581 Secondary hyperparathyroidism of renal origin: Secondary | ICD-10-CM | POA: Diagnosis not present

## 2016-08-02 DIAGNOSIS — D509 Iron deficiency anemia, unspecified: Secondary | ICD-10-CM | POA: Diagnosis not present

## 2016-08-02 DIAGNOSIS — D631 Anemia in chronic kidney disease: Secondary | ICD-10-CM | POA: Diagnosis not present

## 2016-08-02 DIAGNOSIS — B182 Chronic viral hepatitis C: Secondary | ICD-10-CM

## 2016-08-02 MED ORDER — TENOFOVIR DISOPROXIL FUMARATE 300 MG PO TABS: 300.0000 mg | ORAL_TABLET | ORAL | 6 refills | Status: DC

## 2016-08-02 MED ORDER — GLECAPREVIR-PIBRENTASVIR 100-40 MG PO TABS: 3.0000 | ORAL_TABLET | Freq: Every day | ORAL | 2 refills | Status: DC

## 2016-08-02 MED FILL — VIREAD 300 MG TABLET: 300 | 28 days supply | Qty: 4 | Fill #0

## 2016-08-02 NOTE — Progress Notes (Signed)
Pt with co-infected hep B and C. Ok with Dr. Linus Salmons to use TDF while he is on hep C treatment through [facility] to prevent reactivation.

## 2016-08-04 ENCOUNTER — Ambulatory Visit (HOSPITAL_COMMUNITY): Payer: Medicare Other

## 2016-08-04 DIAGNOSIS — N2581 Secondary hyperparathyroidism of renal origin: Secondary | ICD-10-CM | POA: Diagnosis not present

## 2016-08-04 DIAGNOSIS — N186 End stage renal disease: Secondary | ICD-10-CM | POA: Diagnosis not present

## 2016-08-04 DIAGNOSIS — D631 Anemia in chronic kidney disease: Secondary | ICD-10-CM | POA: Diagnosis not present

## 2016-08-04 DIAGNOSIS — D509 Iron deficiency anemia, unspecified: Secondary | ICD-10-CM | POA: Diagnosis not present

## 2016-08-05 ENCOUNTER — Ambulatory Visit (HOSPITAL_COMMUNITY): Payer: Medicare Other

## 2016-08-05 MED FILL — MAVYRET 100-40 MG TABS: 100-40 | 28 days supply | Qty: 84 | Fill #0

## 2016-08-06 DIAGNOSIS — N2581 Secondary hyperparathyroidism of renal origin: Secondary | ICD-10-CM | POA: Diagnosis not present

## 2016-08-06 DIAGNOSIS — D509 Iron deficiency anemia, unspecified: Secondary | ICD-10-CM | POA: Diagnosis not present

## 2016-08-06 DIAGNOSIS — D631 Anemia in chronic kidney disease: Secondary | ICD-10-CM | POA: Diagnosis not present

## 2016-08-06 DIAGNOSIS — N186 End stage renal disease: Secondary | ICD-10-CM | POA: Diagnosis not present

## 2016-08-09 ENCOUNTER — Telehealth: Payer: Self-pay | Admitting: Pharmacy Technician

## 2016-08-09 ENCOUNTER — Encounter: Payer: Self-pay | Admitting: Vascular Surgery

## 2016-08-09 DIAGNOSIS — D631 Anemia in chronic kidney disease: Secondary | ICD-10-CM | POA: Diagnosis not present

## 2016-08-09 DIAGNOSIS — N2581 Secondary hyperparathyroidism of renal origin: Secondary | ICD-10-CM | POA: Diagnosis not present

## 2016-08-09 DIAGNOSIS — D509 Iron deficiency anemia, unspecified: Secondary | ICD-10-CM | POA: Diagnosis not present

## 2016-08-09 DIAGNOSIS — N186 End stage renal disease: Secondary | ICD-10-CM | POA: Diagnosis not present

## 2016-08-09 NOTE — Telephone Encounter (Signed)
Mr. Lennox took Viread daily instead of once a week.  He seems confused and I sceduled him for a clinic visit to help him understand how to take his meds. (it was explained to him at the last clinic visit, but he is demonstrating he has difficulties)

## 2016-08-11 ENCOUNTER — Ambulatory Visit (INDEPENDENT_AMBULATORY_CARE_PROVIDER_SITE_OTHER): Payer: Medicare Other | Admitting: Pharmacist Clinician (PhC)/ Clinical Pharmacy Specialist

## 2016-08-11 ENCOUNTER — Telehealth: Payer: Self-pay | Admitting: Internal Medicine

## 2016-08-11 DIAGNOSIS — Z992 Dependence on renal dialysis: Secondary | ICD-10-CM | POA: Diagnosis not present

## 2016-08-11 DIAGNOSIS — I120 Hypertensive chronic kidney disease with stage 5 chronic kidney disease or end stage renal disease: Secondary | ICD-10-CM | POA: Diagnosis not present

## 2016-08-11 DIAGNOSIS — D631 Anemia in chronic kidney disease: Secondary | ICD-10-CM | POA: Diagnosis not present

## 2016-08-11 DIAGNOSIS — B182 Chronic viral hepatitis C: Secondary | ICD-10-CM | POA: Diagnosis present

## 2016-08-11 DIAGNOSIS — N186 End stage renal disease: Secondary | ICD-10-CM | POA: Diagnosis not present

## 2016-08-11 DIAGNOSIS — D509 Iron deficiency anemia, unspecified: Secondary | ICD-10-CM | POA: Diagnosis not present

## 2016-08-11 DIAGNOSIS — N2581 Secondary hyperparathyroidism of renal origin: Secondary | ICD-10-CM | POA: Diagnosis not present

## 2016-08-11 NOTE — Patient Instructions (Signed)
Start your Mavyret today - 3 tablets every at the same time with food See me back in 2 weeks

## 2016-08-11 NOTE — Telephone Encounter (Signed)
Needs office visit to discuss liver disease and the possibility of cirrhosis We can discuss hemorrhoids at that time including possible repeat banding Over-the-counter Preparation H suppositories per box instruction in the meantime

## 2016-08-11 NOTE — Progress Notes (Signed)
HPI: [name] is a 56 y.o. male who is here to discuss adherence with [place] before starting his hep C meds.   Lab Results  Component Value Date   HCVGENOTYPE 1a 07/22/2016    Allergies: Allergies  Allergen Reactions  . Aspirin Other (See Comments)    STOMACH PAIN  . Clonidine Derivatives Itching  . Tramadol Itching    Vitals:    Past Medical History: Past Medical History:  Diagnosis Date  . Adenomatous colon polyp    tubular  . Anemia   . Anxiety   . Chest pain    DATE UNKNOWN, C/O PERIODICALLY  . Chronic kidney disease    dialysis - M/W/F  . Dialysis patient ([place])    Monday-Wednesday-Friday  . GERD (gastroesophageal reflux disease)    DATE UNKNOWN  . Hemorrhoids   . Hepatitis B, chronic ([place])   . Hepatitis C   . Hyperkalemia   . Hypertension   . Metabolic bone disease    Patient denies  . Pneumonia   . Pulmonary edema   . Renal disorder   . Renal insufficiency   . Shortness of breath dyspnea    " for the last past year with this dialysis"    Social History: Social History   Social History  . Marital status: Single    Spouse name: N/A  . Number of children: 3  . Years of education: 10   Occupational History  . Unemployed    Social History Main Topics  . Smoking status: Current Every Day Smoker    Packs/day: 0.04    Years: 37.00    Types: Cigarettes  . Smokeless tobacco: Never Used     Comment: 3  . Alcohol use No     Comment: quit  . Drug use: No     Comment: last used cocaine 2 weeks ago 07/22/16  . Sexual activity: Not on file     Comment: "NOT LATELY" on cocaine   Other Topics Concern  . Not on file   Social History Narrative   Lives alone   Caffeine use: Coffee-rare   Soda- daily       Labs: Hep B S Ab (no units)  Date Value  07/11/2011 NEGATIVE   Hepatitis B Surface Ag (no units)  Date Value  07/22/2016 POSITIVE (A)   HCV Ab (no units)  Date Value  12/23/2010 Reactive (A)    Lab Results  Component Value Date    HCVGENOTYPE 1a 07/22/2016    Hepatitis C RNA quantitative Latest Ref Rng & Units 03/05/2014  HCV Quantitative <15 IU/mL 9,381,829(H)  HCV Quantitative Log <1.18 log 10 6.46(H)    AST (U/L)  Date Value  07/19/2016 51 (H)  07/11/2016 68 (H)  06/24/2016 78 (H)   ALT (U/L)  Date Value  07/22/2016 39  07/19/2016 42  07/11/2016 52  06/24/2016 41   INR (no units)  Date Value  07/23/2016 1.22  07/22/2016 1.2 (H)  02/27/2014 1.01    CrCl: CrCl cannot be calculated (Unknown ideal weight.).  Fibrosis Score: F4 as assessed by Fibrosure  Child-Pugh Score: A-Bish  Previous Treatment Regimen: None  Assessment: [name] stopped by here after his HD session today to discuss his Mavyret. [name] is co-infected with hep B also. We decided to starting him on Viread to prevent re-activation. However, he was supposed to take the TDF once a week, instead he takes it daily for 4 days. He has a very low level of understanding, therefore, I'm  going to stop it completely and just monitoring him very closely for reactivation. He has a elastography appt coming up next week. Since he is borderline A/B class, Dr. Linus Salmons will need to see him at the cure visit. We are going to see him every two weeks until he is done with treatment then f/u with Dr. Linus Salmons for the cure visit. He is going to start his [place] today (1/31). He got the drug in hand and I showed him how to take it.   Recommendations:  Stop TDF Start Mavyret 3 tabs daily with food x 3 mo F/u wth Rx every 2 wks [place] in 1 wk F/u with Dr. Linus Salmons for cure  [facility], Pharm.D., BCPS, AAHIVP Clinical Infectious [place] [facility] 08/11/2016, 12:09 PM

## 2016-08-11 NOTE — Telephone Encounter (Signed)
Pt had hemorrhoidal banding done with Dr. Carlean Purl in 2015. States he was told he could not have further banding done until he has [place] of liver done. Pt states he is scheduled for [place] next week. Reports his hemorrhoids are really bothering him and he wants to get them banded again. Please advise.

## 2016-08-12 ENCOUNTER — Encounter: Payer: Self-pay | Admitting: Pharmacy Technician

## 2016-08-12 ENCOUNTER — Ambulatory Visit: Payer: Self-pay

## 2016-08-12 NOTE — Telephone Encounter (Signed)
Pt scheduled to see Dr. Hilarie Fredrickson 08/17/16@2pm . Pt aware of appt. States he started a 3 mth treatment for Hep C yesterday and that he does not drink any more.

## 2016-08-13 ENCOUNTER — Encounter: Payer: Self-pay | Admitting: *Deleted

## 2016-08-13 ENCOUNTER — Other Ambulatory Visit: Payer: Self-pay

## 2016-08-13 ENCOUNTER — Ambulatory Visit (INDEPENDENT_AMBULATORY_CARE_PROVIDER_SITE_OTHER): Payer: Self-pay | Admitting: Vascular Surgery

## 2016-08-13 VITALS — BP 204/93 | HR 89 | Temp 93.6°F | Resp 16 | Ht 69.0 in | Wt 147.0 lb

## 2016-08-13 DIAGNOSIS — R0602 Shortness of breath: Secondary | ICD-10-CM

## 2016-08-13 DIAGNOSIS — D509 Iron deficiency anemia, unspecified: Secondary | ICD-10-CM | POA: Diagnosis not present

## 2016-08-13 DIAGNOSIS — N2581 Secondary hyperparathyroidism of renal origin: Secondary | ICD-10-CM | POA: Diagnosis not present

## 2016-08-13 DIAGNOSIS — N186 End stage renal disease: Secondary | ICD-10-CM | POA: Diagnosis not present

## 2016-08-13 DIAGNOSIS — D631 Anemia in chronic kidney disease: Secondary | ICD-10-CM | POA: Diagnosis not present

## 2016-08-13 DIAGNOSIS — R011 Cardiac murmur, unspecified: Secondary | ICD-10-CM

## 2016-08-13 DIAGNOSIS — Z992 Dependence on renal dialysis: Secondary | ICD-10-CM

## 2016-08-13 NOTE — Progress Notes (Signed)
Patient name: [name] MRN: [id number] DOB: [birth year] Sex: male  REASON FOR VISIT: post-op  HPI: [name] is a 56 y.o. male who presents for post-operative follow-up s/p excision of skin ulceration and plication of left brachiocephalic AV fistula. He presented to our office on 07/23/2016 with extreme anxiety that his fistula was going to burst secondary to the ulceration. His fistula has been stuck away from the incision.  The patient states that he has pain around the left antecubital space.  He also pain at the upper third of his fistula (towards the axilla), but has never been stuck there. His pain is worse with dialysis. He says he is unable to sleep at night secondary to this pain. He wants new dialysis access in the right arm.  He also complains of some shortness of breath with exertion.The patient recently started on treatment for hepatitis C. He also has hepatitis B. He has a history of polysubstance abuse. He states that he is not using cocaine or smoking marijuana anymore.  The patient denies any chest pain. He has seen Dr. Einar Gip in the past (last seen 2015) for chest pain. He notes that the left side of his heart does not work as well. From review of the chart, he had an abnormal EKG, with left atrial enlargement, left hypertrophy with repolarization abnormality. The patient wants to be seen again by cardiology.   He has had his fistula revised for aneurysmal degeneration in the past by Dr. Oneida Alar (01/20/2016).   Current Outpatient Prescriptions  Medication Sig Dispense Refill  . albuterol (PROVENTIL HFA;VENTOLIN HFA) 108 (90 Base) MCG/ACT inhaler Inhale 1-2 puffs into the lungs every 6 (six) hours as needed for wheezing or shortness of breath. 1 Inhaler 0  . amLODipine (NORVASC) 10 MG tablet Take 10 mg by mouth at bedtime.     Marland Kitchen aspirin EC 81 MG tablet Take 81 mg by mouth daily.    . bisoprolol (ZEBETA) 10 MG tablet Take 10 mg by mouth daily.    . cinacalcet  (SENSIPAR) 30 MG tablet Take 30 mg by mouth daily.    . DULERA 100-5 MCG/ACT AERO Inhale 2 puffs into the lungs 2 (two) times daily as needed for wheezing or shortness of breath.     . Glecaprevir-Pibrentasvir (MAVYRET) 100-40 MG TABS Take 3 tablets by mouth daily. 84 tablet 2  . lisinopril (PRINIVIL,ZESTRIL) 20 MG tablet Take 40 mg by mouth daily.    Marland Kitchen oxyCODONE-acetaminophen (PERCOCET/ROXICET) 5-325 MG tablet Take 1-2 tablets by mouth every 4 (four) hours as needed for moderate pain. 6 tablet 0  . polyethylene glycol powder (GLYCOLAX/MIRALAX) powder MIX 17 GM WITH 8 OUNCES OF LIQUID AND TAKE BY MOUTH DAILY 527 g 0  . predniSONE (DELTASONE) 50 MG tablet Take 1 tablet daily with breakfast 5 tablet 0  . sevelamer carbonate (RENVELA) 800 MG tablet Take 1,600-3,200 mg by mouth See admin instructions. Take 3200 mg by mouth 3 times daily with meals and take 1600 mg by mouth with snacks.    . traZODone (DESYREL) 50 MG tablet Take 50-100 mg by mouth at bedtime as needed for sleep.     No current facility-administered medications for this visit.     REVIEW OF SYSTEMS:  [X]  denotes positive finding, [ ]  denotes negative finding Cardiac  Comments:  Chest pain or chest pressure:    Shortness of breath upon exertion: x   Short of breath when lying flat:    Irregular heart rhythm:  Constitutional    Fever or chills:      PHYSICAL EXAM: Vitals:   08/13/16 1259  BP: (!) 204/93  Pulse: 89  Resp: 16  Temp: (!) 93.6 F (34.2 C)  TempSrc: Oral  SpO2: 99%  Weight: 147 lb (66.7 kg)  Height: 5\' 9"  (1.753 m)    GENERAL: The patient is a well-nourished male, in no acute distress. The vital signs are documented above. CARDIOVASCULAR: There is a regular rate and rhythm. Systolic murmur best heard over aortic area with radiation to carotids bilaterally.  PULMONARY: There is good air exchange bilaterally without wheezing or rales. VASCULAR: Left upper arm fistula with easily palpable thrill. Incision  is well healed. No ulcerations or skin thinning seen. 2+ left radial pulse.   MEDICAL ISSUES: ESRD on HD S/p plication left brachiocephalic fistula    The patient wants new dialysis access as his left arm hurts. Dr. Donzetta Matters also examined patient and recommended the patient to continue use of his left upper arm fistula as this is functioning well. His incision is well healed and the revised segment of the fistula may be cannulated in two more weeks. Discussed that there are dialysis accesses are of limited durability and that he should not abandon a functioning access.   Will also refer patient over to see Dr. Einar Gip. Patient also request this as he is concerned about the impact of his left arm fistula on the left side of his heart. He also has a systolic murmur noted on exam. He has also complained of shortness of breath recently that he attributes his new hepatitis C regimen.  He wants to follow-up with Dr. Oneida Alar after seeing cardiology as Dr. Oneida Alar has treated him the past. He also asked for pain medication today and I denied this as his incision is well healed and is over 2 weeks post-op.    Virgina Jock, PA-C [facility] MD: Donzetta Matters

## 2016-08-16 ENCOUNTER — Emergency Department (HOSPITAL_COMMUNITY): Payer: Medicare Other

## 2016-08-16 ENCOUNTER — Inpatient Hospital Stay (HOSPITAL_COMMUNITY)
Admission: EM | Admit: 2016-08-16 | Discharge: 2016-08-18 | DRG: 189 | Discharge status: Against Medical Advice | Payer: Medicare Other | Attending: Internal Medicine | Admitting: Internal Medicine

## 2016-08-16 ENCOUNTER — Encounter (HOSPITAL_COMMUNITY): Payer: Self-pay | Admitting: Emergency Medicine

## 2016-08-16 ENCOUNTER — Telehealth: Payer: Self-pay

## 2016-08-16 DIAGNOSIS — Z9115 Patient's noncompliance with renal dialysis: Secondary | ICD-10-CM

## 2016-08-16 DIAGNOSIS — D631 Anemia in chronic kidney disease: Secondary | ICD-10-CM | POA: Diagnosis not present

## 2016-08-16 DIAGNOSIS — Z801 Family history of malignant neoplasm of trachea, bronchus and lung: Secondary | ICD-10-CM

## 2016-08-16 DIAGNOSIS — J441 Chronic obstructive pulmonary disease with (acute) exacerbation: Secondary | ICD-10-CM | POA: Diagnosis present

## 2016-08-16 DIAGNOSIS — Z8249 Family history of ischemic heart disease and other diseases of the circulatory system: Secondary | ICD-10-CM

## 2016-08-16 DIAGNOSIS — R0602 Shortness of breath: Secondary | ICD-10-CM | POA: Diagnosis not present

## 2016-08-16 DIAGNOSIS — B181 Chronic viral hepatitis B without delta-agent: Secondary | ICD-10-CM | POA: Diagnosis present

## 2016-08-16 DIAGNOSIS — J9601 Acute respiratory failure with hypoxia: Secondary | ICD-10-CM

## 2016-08-16 DIAGNOSIS — N2581 Secondary hyperparathyroidism of renal origin: Secondary | ICD-10-CM | POA: Diagnosis not present

## 2016-08-16 DIAGNOSIS — Z825 Family history of asthma and other chronic lower respiratory diseases: Secondary | ICD-10-CM

## 2016-08-16 DIAGNOSIS — Z09 Encounter for follow-up examination after completed treatment for conditions other than malignant neoplasm: Secondary | ICD-10-CM

## 2016-08-16 DIAGNOSIS — F1010 Alcohol abuse, uncomplicated: Secondary | ICD-10-CM | POA: Diagnosis present

## 2016-08-16 DIAGNOSIS — N186 End stage renal disease: Secondary | ICD-10-CM | POA: Diagnosis not present

## 2016-08-16 DIAGNOSIS — J811 Chronic pulmonary edema: Secondary | ICD-10-CM

## 2016-08-16 DIAGNOSIS — J209 Acute bronchitis, unspecified: Secondary | ICD-10-CM | POA: Diagnosis present

## 2016-08-16 DIAGNOSIS — R778 Other specified abnormalities of plasma proteins: Secondary | ICD-10-CM | POA: Diagnosis present

## 2016-08-16 DIAGNOSIS — F17210 Nicotine dependence, cigarettes, uncomplicated: Secondary | ICD-10-CM | POA: Diagnosis present

## 2016-08-16 DIAGNOSIS — Z79899 Other long term (current) drug therapy: Secondary | ICD-10-CM

## 2016-08-16 DIAGNOSIS — J810 Acute pulmonary edema: Principal | ICD-10-CM | POA: Diagnosis present

## 2016-08-16 DIAGNOSIS — D649 Anemia, unspecified: Secondary | ICD-10-CM | POA: Diagnosis present

## 2016-08-16 DIAGNOSIS — K219 Gastro-esophageal reflux disease without esophagitis: Secondary | ICD-10-CM | POA: Diagnosis present

## 2016-08-16 DIAGNOSIS — Z7982 Long term (current) use of aspirin: Secondary | ICD-10-CM

## 2016-08-16 DIAGNOSIS — Z7952 Long term (current) use of systemic steroids: Secondary | ICD-10-CM

## 2016-08-16 DIAGNOSIS — D696 Thrombocytopenia, unspecified: Secondary | ICD-10-CM | POA: Diagnosis present

## 2016-08-16 DIAGNOSIS — I517 Cardiomegaly: Secondary | ICD-10-CM | POA: Diagnosis present

## 2016-08-16 DIAGNOSIS — Z992 Dependence on renal dialysis: Secondary | ICD-10-CM

## 2016-08-16 DIAGNOSIS — I160 Hypertensive urgency: Secondary | ICD-10-CM | POA: Diagnosis present

## 2016-08-16 DIAGNOSIS — I120 Hypertensive chronic kidney disease with stage 5 chronic kidney disease or end stage renal disease: Secondary | ICD-10-CM | POA: Diagnosis present

## 2016-08-16 DIAGNOSIS — J9690 Respiratory failure, unspecified, unspecified whether with hypoxia or hypercapnia: Secondary | ICD-10-CM | POA: Diagnosis present

## 2016-08-16 DIAGNOSIS — R7989 Other specified abnormal findings of blood chemistry: Secondary | ICD-10-CM

## 2016-08-16 DIAGNOSIS — B182 Chronic viral hepatitis C: Secondary | ICD-10-CM | POA: Diagnosis present

## 2016-08-16 DIAGNOSIS — D509 Iron deficiency anemia, unspecified: Secondary | ICD-10-CM | POA: Diagnosis not present

## 2016-08-16 HISTORY — DX: Chronic obstructive pulmonary disease with (acute) exacerbation: J44.1

## 2016-08-16 LAB — CBC WITH DIFFERENTIAL/PLATELET
Basophils Absolute: 0 10*3/uL (ref 0.0–0.1)
Basophils Relative: 0 %
Eosinophils Absolute: 0.1 10*3/uL (ref 0.0–0.7)
Eosinophils Relative: 1 %
HCT: 31.2 % — ABNORMAL LOW (ref 39.0–52.0)
Hemoglobin: 10.4 g/dL — ABNORMAL LOW (ref 13.0–17.0)
Lymphocytes Relative: 34 %
Lymphs Abs: 2.6 10*3/uL (ref 0.7–4.0)
MCH: 27.4 pg (ref 26.0–34.0)
MCHC: 33.3 g/dL (ref 30.0–36.0)
MCV: 82.1 fL (ref 78.0–100.0)
Monocytes Absolute: 0.4 10*3/uL (ref 0.1–1.0)
Monocytes Relative: 6 %
Neutro Abs: 4.6 10*3/uL (ref 1.7–7.7)
Neutrophils Relative %: 59 %
Platelets: 48 10*3/uL — ABNORMAL LOW (ref 150–400)
RBC: 3.8 MIL/uL — ABNORMAL LOW (ref 4.22–5.81)
RDW: 19.4 % — ABNORMAL HIGH (ref 11.5–15.5)
WBC: 7.8 10*3/uL (ref 4.0–10.5)

## 2016-08-16 LAB — BASIC METABOLIC PANEL
Anion gap: 14 (ref 5–15)
BUN: 20 mg/dL (ref 6–20)
CO2: 30 mmol/L (ref 22–32)
Calcium: 9 mg/dL (ref 8.9–10.3)
Chloride: 88 mmol/L — ABNORMAL LOW (ref 101–111)
Creatinine, Ser: 5.81 mg/dL — ABNORMAL HIGH (ref 0.61–1.24)
GFR calc Af Amer: 12 mL/min — ABNORMAL LOW (ref >60)
GFR calc non Af Amer: 10 mL/min — ABNORMAL LOW (ref >60)
Glucose, Bld: 124 mg/dL — ABNORMAL HIGH (ref 65–99)
Potassium: 3.4 mmol/L — ABNORMAL LOW (ref 3.5–5.1)
Sodium: 132 mmol/L — ABNORMAL LOW (ref 135–145)

## 2016-08-16 LAB — I-STAT CHEM 8, ED
BUN: 25 mg/dL — ABNORMAL HIGH (ref 6–20)
Calcium, Ion: 1.06 mmol/L — ABNORMAL LOW (ref 1.15–1.40)
Chloride: 93 mmol/L — ABNORMAL LOW (ref 101–111)
Creatinine, Ser: 5.9 mg/dL — ABNORMAL HIGH (ref 0.61–1.24)
Glucose, Bld: 122 mg/dL — ABNORMAL HIGH (ref 65–99)
HCT: 33 % — ABNORMAL LOW (ref 39.0–52.0)
Hemoglobin: 11.2 g/dL — ABNORMAL LOW (ref 13.0–17.0)
Potassium: 3.5 mmol/L (ref 3.5–5.1)
Sodium: 138 mmol/L (ref 135–145)
TCO2: 32 mmol/L (ref 0–100)

## 2016-08-16 MED ORDER — METHYLPREDNISOLONE SODIUM SUCC 125 MG IJ SOLR
125.0000 mg | Freq: Once | INTRAMUSCULAR | Status: AC
  Administered 2016-08-16: 125 mg via INTRAVENOUS
  Filled 2016-08-16: qty 2

## 2016-08-16 MED ORDER — ALBUTEROL SULFATE (2.5 MG/3ML) 0.083% IN NEBU
5.0000 mg | INHALATION_SOLUTION | Freq: Once | RESPIRATORY_TRACT | Status: AC
  Administered 2016-08-16: 5 mg via RESPIRATORY_TRACT
  Filled 2016-08-16: qty 6

## 2016-08-16 MED ORDER — IPRATROPIUM BROMIDE 0.02 % IN SOLN
0.5000 mg | Freq: Once | RESPIRATORY_TRACT | Status: AC
  Administered 2016-08-16: 0.5 mg via RESPIRATORY_TRACT
  Filled 2016-08-16: qty 2.5

## 2016-08-16 MED ORDER — ALBUTEROL (5 MG/ML) CONTINUOUS INHALATION SOLN
10.0000 mg/h | INHALATION_SOLUTION | RESPIRATORY_TRACT | Status: AC
  Administered 2016-08-16: 10 mg/h via RESPIRATORY_TRACT
  Filled 2016-08-16: qty 20

## 2016-08-16 NOTE — ED Triage Notes (Signed)
BIB EMS for SOB, started yesterday.  EMS heard wheezing, gave neb, resolved wheezing.  Pt did not tolerate second duoneb or CPAP.  93% for EMS.  No CP, N/V, no EKG changes.  Tachycardic and hypertensive to 209/110.

## 2016-08-16 NOTE — Telephone Encounter (Signed)
Called pt and gave him the phone number to Dr Irven Shelling office to call and make appointment for SOB. Referral was made.

## 2016-08-16 NOTE — ED Provider Notes (Signed)
Medical screening examination/treatment/procedure(s) were conducted as a shared visit with non-physician practitioner(s) and myself.  I personally evaluated the patient during the encounter.  56 year old male here with dyspnea. It sounded relatively acute in onset. I will specify for similar to COPD or CHF exacerbations in the past. He does have history of end-stage renal disease but did get an normal dialysis session today.  On my evaluation was tachycardic and hypertensive with tachypnea and in moderate respiratory distress with decreased breath sounds bilaterally along with crackles and some end expiratory wheezing.  Plan to treat for COPD while evaluating for CHF as well.  Patient will likely need admission but needs this see how well his work of breathing improves to determine level of care.   EKG Interpretation  Date/Time:  Monday August 16 2016 21:49:57 EST Ventricular Rate:  126 PR Interval:    QRS Duration: 182 QT Interval:  421 QTC Calculation: 610 R Axis:   -75 Text Interpretation:  Sinus tachycardia Atrial premature complexes Probable left atrial enlargement Left bundle branch block Artifact in lead(s) I III aVL tachycardia new Confirmed by [facility] MD, Corene Cornea [phone]) on 08/16/2016 9:57:54 PM        Merrily Pew, MD 08/17/16 [phone]

## 2016-08-16 NOTE — ED Provider Notes (Signed)
[facility] DEPT Provider Note   CSN: [id number] Arrival date & time: 08/16/16  2137     History   Chief Complaint Chief Complaint  Patient presents with  . Shortness of Breath    HPI [name] is a 56 y.o. male.  The history is provided by the patient and medical records.  Shortness of Breath  Associated symptoms include wheezing.    55 year old male with history of anemia, anxiety, cardiomegaly, history of end-stage renal disease on hemodialysis, scheduled Monday Wednesday Friday, hep B, hep C, hypertension, COPD, presenting to the ED for shortness of breath.  Reports this waxes and wanes all the time.  States got worse yesterday.  O2 sats initially 83% on RA with fire, 93% with EMS.  EMS treated with nebulizer, states they felt he improved however patient denied improvement.  Patient did not tolerate second neb or CPAP when attempted. No steroids given. Patient denies chest pain.  Reports he did have full dialysis treatment today.    Past Medical History:  Diagnosis Date  . Adenomatous colon polyp    tubular  . Anemia   . Anxiety   . Atherosclerosis of aorta ([place])   . Cardiomegaly   . Chest pain    DATE UNKNOWN, C/O PERIODICALLY  . Chronic kidney disease    dialysis - M/W/F  . Cocaine abuse   . Dialysis patient ([place])    Monday-Wednesday-Friday  . GERD (gastroesophageal reflux disease)    DATE UNKNOWN  . Hemorrhoids   . Hepatitis B, chronic ([place])   . Hepatitis C   . Hyperkalemia   . Hypertension   . Metabolic bone disease    Patient denies  . Pneumonia   . Pulmonary edema   . Renal disorder   . Renal insufficiency   . Shortness of breath dyspnea    " for the last past year with this dialysis"  . Tubular adenoma of colon     Patient Active Problem List   Diagnosis Date Noted  . Problem with dialysis access ([place]) 07/23/2016  . End stage renal disease ([place]) 12/02/2015  . Chronic hepatitis B ([place]) 03/05/2014  . Chronic hepatitis C without hepatic  coma ([place]) 03/05/2014  . Internal hemorrhoids with bleeding, swelling and itching 03/05/2014  . Thrombocytopenia, unspecified 03/05/2014  . Chest pain 02/27/2014  . ALCOHOL ABUSE 04/14/2009  . TOBACCO ABUSE 04/14/2009  . GANGLION CYST 04/14/2009    Past Surgical History:  Procedure Laterality Date  . AV FISTULA PLACEMENT  2012   BELIEVED WAS PLACED IN JUNE  . HEMORRHOID BANDING    . LIGATION OF ARTERIOVENOUS  FISTULA Left 6/0/7371   Procedure: Plication of Left Arm Arteriovenous Fistula;  Surgeon: Elam Dutch, MD;  Location: [facility];  Service: Vascular;  Laterality: Left;  . REVISON OF ARTERIOVENOUS FISTULA Left 0/62/6948   Procedure: PLICATION OF DISTAL ANEURYSMAL SEGEMENT OF LEFT UPPER ARM ARTERIOVENOUS FISTULA;  Surgeon: Elam Dutch, MD;  Location: [place];  Service: Vascular;  Laterality: Left;  . REVISON OF ARTERIOVENOUS FISTULA Left 5/46/2703   Procedure: Plication of Left Upper Arm Fistula ;  Surgeon: Waynetta Sandy, MD;  Location: [facility] OR;  Service: Vascular;  Laterality: Left;       Home Medications    Prior to Admission medications   Medication Sig Start Date End Date Taking? Authorizing Provider  albuterol (PROVENTIL HFA;VENTOLIN HFA) 108 (90 Base) MCG/ACT inhaler Inhale 1-2 puffs into the lungs every 6 (six) hours as needed for wheezing or  shortness of breath. 07/11/16   Kinnie Feil, PA-C  amLODipine (NORVASC) 10 MG tablet Take 10 mg by mouth at bedtime.     Historical Provider, MD  aspirin EC 81 MG tablet Take 81 mg by mouth daily.    Historical Provider, MD  bisoprolol (ZEBETA) 10 MG tablet Take 10 mg by mouth daily.    Historical Provider, MD  cinacalcet (SENSIPAR) 30 MG tablet Take 30 mg by mouth daily.    Historical Provider, MD  DULERA 100-5 MCG/ACT AERO Inhale 2 puffs into the lungs 2 (two) times daily as needed for wheezing or shortness of breath.  09/22/15   Historical Provider, MD  Glecaprevir-Pibrentasvir (MAVYRET) 100-40 MG TABS Take 3  tablets by mouth daily. 08/02/16   Thayer Headings, MD  lisinopril (PRINIVIL,ZESTRIL) 20 MG tablet Take 40 mg by mouth daily.    Historical Provider, MD  oxyCODONE-acetaminophen (PERCOCET/ROXICET) 5-325 MG tablet Take 1-2 tablets by mouth every 4 (four) hours as needed for moderate pain. 07/24/16   Alvia Grove, PA-C  polyethylene glycol powder (GLYCOLAX/MIRALAX) powder MIX 17 GM WITH 8 OUNCES OF LIQUID AND TAKE BY MOUTH DAILY 03/22/16   Gatha Mayer, MD  predniSONE (DELTASONE) 50 MG tablet Take 1 tablet daily with breakfast 06/24/16   Elmyra Ricks Pisciotta, PA-C  sevelamer carbonate (RENVELA) 800 MG tablet Take 1,600-3,200 mg by mouth See admin instructions. Take 3200 mg by mouth 3 times daily with meals and take 1600 mg by mouth with snacks.    Historical Provider, MD  traZODone (DESYREL) 50 MG tablet Take 50-100 mg by mouth at bedtime as needed for sleep.    Historical Provider, MD    Family History Family History  Problem Relation Age of Onset  . Heart disease Mother   . Lung cancer Mother   . Heart disease Father   . Malignant hyperthermia Father   . Throat cancer Sister   . Hypertension Other   . COPD Other   . Colon cancer Neg Hx     Social History Social History  Substance Use Topics  . Smoking status: Current Every Day Smoker    Packs/day: 0.04    Years: 37.00    Types: Cigarettes  . Smokeless tobacco: Never Used     Comment: 2-3 cigarettes per day  . Alcohol use No     Comment: quit     Allergies   Aspirin; Clonidine derivatives; and Tramadol   Review of Systems Review of Systems  Respiratory: Positive for shortness of breath and wheezing.   All other systems reviewed and are negative.    Physical Exam Updated Vital Signs BP (!) 150/118 (BP Location: Right Arm)   Pulse (!) 123   Temp 97.5 F (36.4 C) (Axillary)   Resp 24   Ht 5\' 9"  (1.753 m)   Wt 66.7 kg   SpO2 96%   BMI 21.71 kg/m   Physical Exam  Constitutional: He is oriented to person, place,  and time. He appears well-developed and well-nourished.  HENT:  Head: Normocephalic and atraumatic.  Mouth/Throat: Oropharynx is clear and moist.  Eyes: Conjunctivae and EOM are normal. Pupils are equal, round, and reactive to light.  Neck: Normal range of motion.  Cardiovascular: Normal rate, regular rhythm and normal heart sounds.   Pulmonary/Chest: Accessory muscle usage present. Tachypnea noted. He is in respiratory distress. He has wheezes.  Increased work of breathing with retractions and accessory muscle use; lungs are tight, wheezes audible  Abdominal: Soft. Bowel sounds are  normal.  Musculoskeletal: Normal range of motion.  Neurological: He is alert and oriented to person, place, and time.  Skin: Skin is warm and dry.  Psychiatric: He has a normal mood and affect.  Nursing note and vitals reviewed.    ED Treatments / Results  Labs (all labs ordered are listed, but only abnormal results are displayed) Labs Reviewed  CBC WITH DIFFERENTIAL/PLATELET - Abnormal; Notable for the following:       Result Value   RBC 3.80 (*)    Hemoglobin 10.4 (*)    HCT 31.2 (*)    RDW 19.4 (*)    Platelets 48 (*)    All other components within normal limits  BASIC METABOLIC PANEL - Abnormal; Notable for the following:    Sodium 132 (*)    Potassium 3.4 (*)    Chloride 88 (*)    Glucose, Bld 124 (*)    Creatinine, Ser 5.81 (*)    GFR calc non Af Amer 10 (*)    GFR calc Af Amer 12 (*)    All other components within normal limits  TROPONIN I  I-STAT TROPOININ, ED    EKG  EKG Interpretation  Date/Time:  Monday August 16 2016 21:49:57 EST Ventricular Rate:  126 PR Interval:    QRS Duration: 182 QT Interval:  421 QTC Calculation: 610 R Axis:   -75 Text Interpretation:  Sinus tachycardia Atrial premature complexes Probable left atrial enlargement Left bundle branch block Artifact in lead(s) I III aVL tachycardia new Confirmed by [facility] MD, Corene Cornea [id number]) on 08/16/2016 9:57:54 PM         Radiology Dg Chest Port 1 View  Result Date: 08/16/2016 CLINICAL DATA:  Shortness of breath EXAM: PORTABLE CHEST 1 VIEW COMPARISON:  07/11/2016 FINDINGS: AP portable semi -erect view chest demonstrates mild cardiomegaly. Central vascular congestion is present. Right greater than left interstitial and alveolar opacities suspicious for pulmonary edema. Suspect tiny effusions. No pneumothorax. IMPRESSION: Cardiomegaly with central vascular congestion and right greater than left interstitial and alveolar opacities suspicious for pulmonary edema. Probable tiny effusions. Electronically Signed   By: Donavan Foil M.D.   On: 08/16/2016 22:58    Procedures Procedures (including critical care time)  CRITICAL CARE Performed by: Larene Pickett   Total critical care time: 45 minutes  Critical care time was exclusive of separately billable procedures and treating other patients.  Critical care was necessary to treat or prevent imminent or life-threatening deterioration.  Critical care was time spent personally by me on the following activities: development of treatment plan with patient and/or surrogate as well as nursing, discussions with consultants, evaluation of patient's response to treatment, examination of patient, obtaining history from patient or surrogate, ordering and performing treatments and interventions, ordering and review of laboratory studies, ordering and review of radiographic studies, pulse oximetry and re-evaluation of patient's condition.   Medications Ordered in ED Medications  albuterol (PROVENTIL,VENTOLIN) solution continuous neb (10 mg/hr Nebulization New Bag/Given 08/16/16 2254)  hydrALAZINE (APRESOLINE) injection 5 mg (not administered)  albuterol (PROVENTIL) (2.5 MG/3ML) 0.083% nebulizer solution 5 mg (5 mg Nebulization Given by Other 08/16/16 2225)  ipratropium (ATROVENT) nebulizer solution 0.5 mg (0.5 mg Nebulization Given by Other 08/16/16 2226)  methylPREDNISolone  sodium succinate (SOLU-MEDROL) 125 mg/2 mL injection 125 mg (125 mg Intravenous Given 08/16/16 2218)     Initial Impression / Assessment and Plan / ED Course  I have reviewed the triage vital signs and the nursing notes.  Pertinent labs & imaging results  that were available during my care of the patient were reviewed by me and considered in my medical decision making (see chart for details).  56 year old male here with shortness of breath. Reports this waxes and wanes, but got worse yesterday. Does have history of COPD, and used to smoke. On arrival patient is distressed with increased work of breathing, tachypnea, retractions, accessory muscle use.  Was hypoxic in the field at 83%.  Was treated with one neb prior to arrival.  Will give additional nebs, solu-medrol.  Labs and CXR pending.  Reports normal dialysis session earlier today.   10:38 PM After additional albuterol/atrovent neb here patient looks improved, however still has large amount of wheezing.  Will start on CAT and monitor closely.  Labs overall reassuring, appear baseline compared with prior.  CXR with some edema noted.  12:36 AM Patient nearly completed hour long neb.  States he is feeling somewhat better but still with increased work of breathing and persist wheezes.  Appears to be getting somewhat tired but still arousable and protecting airway.  Does not want to try bipap as he is claustrophobic.  Will consult for admission for COPD exacerbation/respiratory failure.  Discussed with Dr. Blaine Hamper-- will admit to step-down.  Temp admit orders placed.      Final Clinical Impressions(s) / ED Diagnoses   Final diagnoses:  COPD exacerbation ([place])  Acute respiratory failure with hypoxia [facility])    New Prescriptions New Prescriptions   No medications on file     Larene Pickett, PA-C 08/17/16 0055    Merrily Pew, MD 08/17/16 [phone]

## 2016-08-17 ENCOUNTER — Encounter (HOSPITAL_COMMUNITY): Payer: Self-pay | Admitting: Internal Medicine

## 2016-08-17 ENCOUNTER — Ambulatory Visit: Payer: Self-pay | Admitting: Internal Medicine

## 2016-08-17 ENCOUNTER — Ambulatory Visit (HOSPITAL_COMMUNITY): Payer: Medicare Other

## 2016-08-17 ENCOUNTER — Telehealth: Payer: Self-pay | Admitting: *Deleted

## 2016-08-17 DIAGNOSIS — F1010 Alcohol abuse, uncomplicated: Secondary | ICD-10-CM

## 2016-08-17 DIAGNOSIS — D696 Thrombocytopenia, unspecified: Secondary | ICD-10-CM

## 2016-08-17 DIAGNOSIS — J810 Acute pulmonary edema: Secondary | ICD-10-CM | POA: Diagnosis present

## 2016-08-17 DIAGNOSIS — Z8249 Family history of ischemic heart disease and other diseases of the circulatory system: Secondary | ICD-10-CM | POA: Diagnosis not present

## 2016-08-17 DIAGNOSIS — N186 End stage renal disease: Secondary | ICD-10-CM

## 2016-08-17 DIAGNOSIS — R748 Abnormal levels of other serum enzymes: Secondary | ICD-10-CM | POA: Diagnosis not present

## 2016-08-17 DIAGNOSIS — I517 Cardiomegaly: Secondary | ICD-10-CM | POA: Diagnosis present

## 2016-08-17 DIAGNOSIS — J811 Chronic pulmonary edema: Secondary | ICD-10-CM | POA: Diagnosis not present

## 2016-08-17 DIAGNOSIS — R778 Other specified abnormalities of plasma proteins: Secondary | ICD-10-CM | POA: Diagnosis present

## 2016-08-17 DIAGNOSIS — F17210 Nicotine dependence, cigarettes, uncomplicated: Secondary | ICD-10-CM | POA: Diagnosis present

## 2016-08-17 DIAGNOSIS — J9690 Respiratory failure, unspecified, unspecified whether with hypoxia or hypercapnia: Secondary | ICD-10-CM | POA: Diagnosis present

## 2016-08-17 DIAGNOSIS — N2581 Secondary hyperparathyroidism of renal origin: Secondary | ICD-10-CM | POA: Diagnosis not present

## 2016-08-17 DIAGNOSIS — Z9115 Patient's noncompliance with renal dialysis: Secondary | ICD-10-CM | POA: Diagnosis not present

## 2016-08-17 DIAGNOSIS — B182 Chronic viral hepatitis C: Secondary | ICD-10-CM | POA: Diagnosis present

## 2016-08-17 DIAGNOSIS — J9601 Acute respiratory failure with hypoxia: Secondary | ICD-10-CM | POA: Diagnosis not present

## 2016-08-17 DIAGNOSIS — J209 Acute bronchitis, unspecified: Secondary | ICD-10-CM

## 2016-08-17 DIAGNOSIS — R7989 Other specified abnormal findings of blood chemistry: Secondary | ICD-10-CM

## 2016-08-17 DIAGNOSIS — D631 Anemia in chronic kidney disease: Secondary | ICD-10-CM | POA: Diagnosis not present

## 2016-08-17 DIAGNOSIS — I160 Hypertensive urgency: Secondary | ICD-10-CM

## 2016-08-17 DIAGNOSIS — K219 Gastro-esophageal reflux disease without esophagitis: Secondary | ICD-10-CM | POA: Diagnosis present

## 2016-08-17 DIAGNOSIS — Z7982 Long term (current) use of aspirin: Secondary | ICD-10-CM | POA: Diagnosis not present

## 2016-08-17 DIAGNOSIS — F17200 Nicotine dependence, unspecified, uncomplicated: Secondary | ICD-10-CM | POA: Diagnosis not present

## 2016-08-17 DIAGNOSIS — J441 Chronic obstructive pulmonary disease with (acute) exacerbation: Secondary | ICD-10-CM | POA: Diagnosis present

## 2016-08-17 DIAGNOSIS — I120 Hypertensive chronic kidney disease with stage 5 chronic kidney disease or end stage renal disease: Secondary | ICD-10-CM | POA: Diagnosis not present

## 2016-08-17 DIAGNOSIS — Z801 Family history of malignant neoplasm of trachea, bronchus and lung: Secondary | ICD-10-CM | POA: Diagnosis not present

## 2016-08-17 DIAGNOSIS — Z7952 Long term (current) use of systemic steroids: Secondary | ICD-10-CM | POA: Diagnosis not present

## 2016-08-17 DIAGNOSIS — Z992 Dependence on renal dialysis: Secondary | ICD-10-CM | POA: Diagnosis not present

## 2016-08-17 DIAGNOSIS — B181 Chronic viral hepatitis B without delta-agent: Secondary | ICD-10-CM | POA: Diagnosis present

## 2016-08-17 DIAGNOSIS — D649 Anemia, unspecified: Secondary | ICD-10-CM | POA: Diagnosis present

## 2016-08-17 DIAGNOSIS — E8770 Fluid overload, unspecified: Secondary | ICD-10-CM | POA: Diagnosis not present

## 2016-08-17 DIAGNOSIS — Z825 Family history of asthma and other chronic lower respiratory diseases: Secondary | ICD-10-CM | POA: Diagnosis not present

## 2016-08-17 DIAGNOSIS — Z79899 Other long term (current) drug therapy: Secondary | ICD-10-CM | POA: Diagnosis not present

## 2016-08-17 HISTORY — DX: Chronic obstructive pulmonary disease with (acute) exacerbation: J44.1

## 2016-08-17 LAB — I-STAT ARTERIAL BLOOD GAS, ED
Acid-Base Excess: 8 mmol/L — ABNORMAL HIGH (ref 0.0–2.0)
Bicarbonate: 32.3 mmol/L — ABNORMAL HIGH (ref 20.0–28.0)
O2 Saturation: 92 %
Patient temperature: 97.5
TCO2: 34 mmol/L (ref 0–100)
pCO2 arterial: 40.5 mmHg (ref 32.0–48.0)
pH, Arterial: 7.507 — ABNORMAL HIGH (ref 7.350–7.450)
pO2, Arterial: 56 mmHg — ABNORMAL LOW (ref 83.0–108.0)

## 2016-08-17 LAB — MRSA PCR SCREENING: MRSA by PCR: NEGATIVE

## 2016-08-17 LAB — LIPID PANEL
Cholesterol: 103 mg/dL (ref 0–200)
HDL: 34 mg/dL — ABNORMAL LOW (ref >40)
LDL Cholesterol: 50 mg/dL (ref 0–99)
Total CHOL/HDL Ratio: 3 RATIO
Triglycerides: 97 mg/dL (ref <150)
VLDL: 19 mg/dL (ref 0–40)

## 2016-08-17 LAB — HIV ANTIBODY (ROUTINE TESTING W REFLEX): HIV Screen 4th Generation wRfx: NONREACTIVE

## 2016-08-17 LAB — TROPONIN I
Troponin I: 0.1 ng/mL (ref <0.03)
Troponin I: 0.11 ng/mL (ref <0.03)
Troponin I: 0.22 ng/mL (ref <0.03)
Troponin I: 0.33 ng/mL (ref <0.03)

## 2016-08-17 LAB — RAPID URINE DRUG SCREEN, HOSP PERFORMED
Amphetamines: NOT DETECTED
Barbiturates: NOT DETECTED
Benzodiazepines: NOT DETECTED
Cocaine: NOT DETECTED
Opiates: NOT DETECTED
Tetrahydrocannabinol: NOT DETECTED

## 2016-08-17 LAB — INFLUENZA PANEL BY PCR (TYPE A & B)
Influenza A By PCR: NEGATIVE
Influenza B By PCR: NEGATIVE

## 2016-08-17 LAB — STREP PNEUMONIAE URINARY ANTIGEN: Strep Pneumo Urinary Antigen: NEGATIVE

## 2016-08-17 MED ORDER — ADULT MULTIVITAMIN W/MINERALS CH
1.0000 | ORAL_TABLET | Freq: Every day | ORAL | Status: DC
  Administered 2016-08-18: 1 via ORAL
  Filled 2016-08-17: qty 1

## 2016-08-17 MED ORDER — IPRATROPIUM BROMIDE 0.02 % IN SOLN
0.5000 mg | Freq: Four times a day (QID) | RESPIRATORY_TRACT | Status: DC
  Administered 2016-08-17 (×4): 0.5 mg via RESPIRATORY_TRACT
  Filled 2016-08-17 (×3): qty 2.5

## 2016-08-17 MED ORDER — LEVALBUTEROL HCL 1.25 MG/0.5ML IN NEBU
1.2500 mg | INHALATION_SOLUTION | Freq: Three times a day (TID) | RESPIRATORY_TRACT | Status: DC
  Administered 2016-08-18: 1.25 mg via RESPIRATORY_TRACT
  Filled 2016-08-17: qty 0.5

## 2016-08-17 MED ORDER — VITAMIN B-1 100 MG PO TABS
100.0000 mg | ORAL_TABLET | Freq: Every day | ORAL | Status: DC
  Administered 2016-08-17 – 2016-08-18 (×2): 100 mg via ORAL
  Filled 2016-08-17 (×2): qty 1

## 2016-08-17 MED ORDER — OXYCODONE-ACETAMINOPHEN 5-325 MG PO TABS
1.0000 | ORAL_TABLET | ORAL | Status: DC | PRN
  Administered 2016-08-17: 2 via ORAL
  Filled 2016-08-17: qty 2

## 2016-08-17 MED ORDER — THIAMINE HCL 100 MG/ML IJ SOLN: 100.0000 mg | Freq: Every day | INTRAMUSCULAR | Status: DC

## 2016-08-17 MED ORDER — AMLODIPINE BESYLATE 10 MG PO TABS
10.0000 mg | ORAL_TABLET | Freq: Every day | ORAL | Status: DC
  Administered 2016-08-17: 10 mg via ORAL
  Filled 2016-08-17: qty 1

## 2016-08-17 MED ORDER — ASPIRIN EC 81 MG PO TBEC
81.0000 mg | DELAYED_RELEASE_TABLET | Freq: Every day | ORAL | Status: DC
  Administered 2016-08-17 – 2016-08-18 (×2): 81 mg via ORAL
  Filled 2016-08-17 (×2): qty 1

## 2016-08-17 MED ORDER — SEVELAMER CARBONATE 800 MG PO TABS
3200.0000 mg | ORAL_TABLET | Freq: Three times a day (TID) | ORAL | Status: DC
  Administered 2016-08-17 (×2): 3200 mg via ORAL
  Filled 2016-08-17 (×5): qty 4

## 2016-08-17 MED ORDER — SEVELAMER CARBONATE 800 MG PO TABS
1600.0000 mg | ORAL_TABLET | ORAL | Status: DC | PRN
  Filled 2016-08-17: qty 2

## 2016-08-17 MED ORDER — SODIUM CHLORIDE 0.9 % IV SOLN
62.5000 mg | INTRAVENOUS | Status: DC
  Filled 2016-08-17: qty 5

## 2016-08-17 MED ORDER — LORAZEPAM 2 MG/ML IJ SOLN
0.0000 mg | Freq: Four times a day (QID) | INTRAMUSCULAR | Status: DC
  Administered 2016-08-17 – 2016-08-18 (×2): 2 mg via INTRAVENOUS
  Filled 2016-08-17 (×2): qty 1

## 2016-08-17 MED ORDER — DARBEPOETIN ALFA 100 MCG/0.5ML IJ SOSY
100.0000 ug | PREFILLED_SYRINGE | INTRAMUSCULAR | Status: DC
  Filled 2016-08-17: qty 0.5

## 2016-08-17 MED ORDER — TRAZODONE HCL 50 MG PO TABS: 50.0000 mg | ORAL_TABLET | Freq: Every evening | ORAL | Status: DC | PRN

## 2016-08-17 MED ORDER — ZOLPIDEM TARTRATE 5 MG PO TABS: 5.0000 mg | ORAL_TABLET | Freq: Every evening | ORAL | Status: DC | PRN

## 2016-08-17 MED ORDER — METHYLPREDNISOLONE SODIUM SUCC 125 MG IJ SOLR
60.0000 mg | Freq: Three times a day (TID) | INTRAMUSCULAR | Status: DC
  Administered 2016-08-17 (×2): 60 mg via INTRAVENOUS
  Filled 2016-08-17 (×2): qty 2

## 2016-08-17 MED ORDER — GLECAPREVIR-PIBRENTASVIR 100-40 MG PO TABS
3.0000 | ORAL_TABLET | Freq: Every day | ORAL | Status: DC
Start: 2016-08-17 — End: 2016-08-18

## 2016-08-17 MED ORDER — POLYETHYLENE GLYCOL 3350 17 G PO PACK: 1.0000 | PACK | Freq: Every day | ORAL | Status: DC | PRN

## 2016-08-17 MED ORDER — FOLIC ACID 1 MG PO TABS
1.0000 mg | ORAL_TABLET | Freq: Every day | ORAL | Status: DC
  Administered 2016-08-18: 1 mg via ORAL
  Filled 2016-08-17: qty 1

## 2016-08-17 MED ORDER — LEVALBUTEROL HCL 1.25 MG/0.5ML IN NEBU
1.2500 mg | INHALATION_SOLUTION | Freq: Four times a day (QID) | RESPIRATORY_TRACT | Status: DC
  Administered 2016-08-17 (×4): 1.25 mg via RESPIRATORY_TRACT
  Filled 2016-08-17 (×4): qty 0.5

## 2016-08-17 MED ORDER — PREDNISONE 50 MG PO TABS
50.0000 mg | ORAL_TABLET | Freq: Every day | ORAL | Status: DC
  Administered 2016-08-18: 50 mg via ORAL
  Filled 2016-08-17: qty 1

## 2016-08-17 MED ORDER — LISINOPRIL 40 MG PO TABS
40.0000 mg | ORAL_TABLET | Freq: Every day | ORAL | Status: DC
  Administered 2016-08-17 – 2016-08-18 (×2): 40 mg via ORAL
  Filled 2016-08-17: qty 1
  Filled 2016-08-17: qty 2

## 2016-08-17 MED ORDER — CALCITRIOL 0.5 MCG PO CAPS: 0.7500 ug | ORAL_CAPSULE | ORAL | Status: DC

## 2016-08-17 MED ORDER — DM-GUAIFENESIN ER 30-600 MG PO TB12
1.0000 | ORAL_TABLET | Freq: Two times a day (BID) | ORAL | Status: DC
  Administered 2016-08-17 – 2016-08-18 (×2): 1 via ORAL
  Filled 2016-08-17 (×2): qty 1

## 2016-08-17 MED ORDER — ACETAMINOPHEN 325 MG PO TABS: 650.0000 mg | ORAL_TABLET | Freq: Four times a day (QID) | ORAL | Status: DC | PRN

## 2016-08-17 MED ORDER — IPRATROPIUM BROMIDE 0.02 % IN SOLN
0.5000 mg | RESPIRATORY_TRACT | Status: DC
Start: 2016-08-17 — End: 2016-08-17

## 2016-08-17 MED ORDER — LORAZEPAM 2 MG/ML IJ SOLN
0.0000 mg | Freq: Two times a day (BID) | INTRAMUSCULAR | Status: DC
Start: 2016-08-19 — End: 2016-08-18

## 2016-08-17 MED ORDER — LORAZEPAM 2 MG/ML IJ SOLN: 1.0000 mg | Freq: Four times a day (QID) | INTRAMUSCULAR | Status: DC | PRN

## 2016-08-17 MED ORDER — IPRATROPIUM BROMIDE 0.02 % IN SOLN
0.5000 mg | Freq: Three times a day (TID) | RESPIRATORY_TRACT | Status: DC
  Administered 2016-08-18: 0.5 mg via RESPIRATORY_TRACT
  Filled 2016-08-17: qty 2.5

## 2016-08-17 MED ORDER — CINACALCET HCL 30 MG PO TABS
30.0000 mg | ORAL_TABLET | Freq: Every day | ORAL | Status: DC
  Administered 2016-08-17 – 2016-08-18 (×2): 30 mg via ORAL
  Filled 2016-08-17 (×3): qty 1

## 2016-08-17 MED ORDER — HYDRALAZINE HCL 20 MG/ML IJ SOLN
5.0000 mg | INTRAMUSCULAR | Status: DC | PRN
  Administered 2016-08-17: 5 mg via INTRAVENOUS
  Filled 2016-08-17: qty 1

## 2016-08-17 MED ORDER — AZITHROMYCIN 250 MG PO TABS
500.0000 mg | ORAL_TABLET | Freq: Every day | ORAL | Status: AC
  Administered 2016-08-17: 500 mg via ORAL
  Filled 2016-08-17: qty 2

## 2016-08-17 MED ORDER — LORAZEPAM 1 MG PO TABS
1.0000 mg | ORAL_TABLET | Freq: Four times a day (QID) | ORAL | Status: DC | PRN
  Administered 2016-08-18: 1 mg via ORAL
  Filled 2016-08-17: qty 1

## 2016-08-17 MED ORDER — AZITHROMYCIN 250 MG PO TABS
250.0000 mg | ORAL_TABLET | Freq: Every day | ORAL | Status: DC
  Administered 2016-08-18: 250 mg via ORAL
  Filled 2016-08-17: qty 1

## 2016-08-17 MED ORDER — BISOPROLOL FUMARATE 5 MG PO TABS
10.0000 mg | ORAL_TABLET | Freq: Every day | ORAL | Status: DC
  Administered 2016-08-17 – 2016-08-18 (×2): 10 mg via ORAL
  Filled 2016-08-17: qty 1
  Filled 2016-08-17: qty 2

## 2016-08-17 MED ORDER — NICOTINE 21 MG/24HR TD PT24
21.0000 mg | MEDICATED_PATCH | Freq: Every day | TRANSDERMAL | Status: DC
  Filled 2016-08-17 (×2): qty 1

## 2016-08-17 NOTE — H&P (Signed)
History and Physical    [name] TWS:[id number] DOB: [birth year] DOA: 08/16/2016  Referring MD/NP/PA:   PCP: [name], MD   Patient coming from:  The patient is coming from home.  At baseline, pt is independent for most of ADL.  Chief Complaint: Cough, shortness of breath and wheezing  HPI: [name] is a 56 y.o. male with medical history significant of hypertension, GERD, anxiety, HCV, HBV, ESRD-HD (MMF), polysubstance abuse (cocaine, alcohol and tobacco), anemia, dCHF, who presents with shortness breath, cough, and wheezing.  Patient states that he has been having productive cough, shortness of breath and wheezing for more than 2 days. Patient uses extra respiratory muscle for breathing. Barely speaking full sentences. He denies symptoms of flu, no runny nose or sore throat. Denies chest pain. No fever or chills. Patient does not have nausea, vomiting, diarrhea, abdominal pain, symptoms of UTI or unilateral weakness.   ED Course: pt was found to have  WBC 7.8, potassium 3.4, creatinine 5.81, BUN 20, platelet 48 which was 52 on 07/24/16, temperature normal, tachycardia, tachypnea, elevated her blood pressure 209/110-->154/81, trop 0.10. chest x-ray showed vascular congestion without infiltration. Patient is admitted to stepdown as inpatient.   Review of Systems:   General: no fevers, chills, no changes in body weight, has poor appetite, has fatigue HEENT: no blurry vision, hearing changes or sore throat Respiratory: has dyspnea, coughing, wheezing CV: no chest pain, no palpitations GI: no nausea, vomiting, abdominal pain, diarrhea, constipation GU: no dysuria, burning on urination, increased urinary frequency, hematuria  Ext: no leg edema Neuro: no unilateral weakness, numbness, or tingling, no vision change or hearing loss Skin: no rash, no skin tear. MSK: No muscle spasm, no deformity, no limitation of range of movement in spin Heme: No easy bruising.  Travel  history: No recent long distant travel.  Allergy:  Allergies  Allergen Reactions  . Aspirin Other (See Comments)    STOMACH PAIN  . Clonidine Derivatives Itching  . Tramadol Itching    Past Medical History:  Diagnosis Date  . Adenomatous colon polyp    tubular  . Anemia   . Anxiety   . Atherosclerosis of aorta ([place])   . Cardiomegaly   . Chest pain    DATE UNKNOWN, C/O PERIODICALLY  . Chronic kidney disease    dialysis - M/W/F  . Cocaine abuse   . COPD exacerbation ([name]) 08/17/2016  . Dialysis patient ([place])    Monday-Wednesday-Friday  . GERD (gastroesophageal reflux disease)    DATE UNKNOWN  . Hemorrhoids   . Hepatitis B, chronic ([place])   . Hepatitis C   . Hyperkalemia   . Hypertension   . Metabolic bone disease    Patient denies  . Pneumonia   . Pulmonary edema   . Renal disorder   . Renal insufficiency   . Shortness of breath dyspnea    " for the last past year with this dialysis"  . Tubular adenoma of colon     Past Surgical History:  Procedure Laterality Date  . AV FISTULA PLACEMENT  2012   BELIEVED WAS PLACED IN JUNE  . HEMORRHOID BANDING    . LIGATION OF ARTERIOVENOUS  FISTULA Left 0/0/1749   Procedure: Plication of Left Arm Arteriovenous Fistula;  Surgeon: Elam Dutch, MD;  Location: [facility];  Service: Vascular;  Laterality: Left;  . REVISON OF ARTERIOVENOUS FISTULA Left 4/49/6759   Procedure: PLICATION OF DISTAL ANEURYSMAL SEGEMENT OF LEFT UPPER ARM ARTERIOVENOUS FISTULA;  Surgeon: Jessy Oto  Fields, MD;  Location: [facility];  Service: Vascular;  Laterality: Left;  . REVISON OF ARTERIOVENOUS FISTULA Left 4/48/1856   Procedure: Plication of Left Upper Arm Fistula ;  Surgeon: Waynetta Sandy, MD;  Location[facility] OR;  Service: Vascular;  Laterality: Left;    Social History:  reports that he has been smoking Cigarettes.  He has a 1.48 pack-year smoking history. He has never used smokeless tobacco. He reports that he does not drink alcohol or use  drugs.  Family History:  Family History  Problem Relation Age of Onset  . Heart disease Mother   . Lung cancer Mother   . Heart disease Father   . Malignant hyperthermia Father   . Throat cancer Sister   . Hypertension Other   . COPD Other   . Colon cancer Neg Hx      Prior to Admission medications   Medication Sig Start Date End Date Taking? Authorizing Provider  albuterol (PROVENTIL HFA;VENTOLIN HFA) 108 (90 Base) MCG/ACT inhaler Inhale 1-2 puffs into the lungs every 6 (six) hours as needed for wheezing or shortness of breath. 07/11/16   Kinnie Feil, PA-C  amLODipine (NORVASC) 10 MG tablet Take 10 mg by mouth at bedtime.     Historical Provider, MD  aspirin EC 81 MG tablet Take 81 mg by mouth daily.    Historical Provider, MD  bisoprolol (ZEBETA) 10 MG tablet Take 10 mg by mouth daily.    Historical Provider, MD  cinacalcet (SENSIPAR) 30 MG tablet Take 30 mg by mouth daily.    Historical Provider, MD  DULERA 100-5 MCG/ACT AERO Inhale 2 puffs into the lungs 2 (two) times daily as needed for wheezing or shortness of breath.  09/22/15   Historical Provider, MD  Glecaprevir-Pibrentasvir (MAVYRET) 100-40 MG TABS Take 3 tablets by mouth daily. 08/02/16   Thayer Headings, MD  lisinopril (PRINIVIL,ZESTRIL) 20 MG tablet Take 40 mg by mouth daily.    Historical Provider, MD  oxyCODONE-acetaminophen (PERCOCET/ROXICET) 5-325 MG tablet Take 1-2 tablets by mouth every 4 (four) hours as needed for moderate pain. 07/24/16   Alvia Grove, PA-C  polyethylene glycol powder (GLYCOLAX/MIRALAX) powder MIX 17 GM WITH 8 OUNCES OF LIQUID AND TAKE BY MOUTH DAILY 03/22/16   Gatha Mayer, MD  predniSONE (DELTASONE) 50 MG tablet Take 1 tablet daily with breakfast 06/24/16   Elmyra Ricks Pisciotta, PA-C  sevelamer carbonate (RENVELA) 800 MG tablet Take 1,600-3,200 mg by mouth See admin instructions. Take 3200 mg by mouth 3 times daily with meals and take 1600 mg by mouth with snacks.    Historical Provider, MD   traZODone (DESYREL) 50 MG tablet Take 50-100 mg by mouth at bedtime as needed for sleep.    Historical Provider, MD    Physical Exam: Vitals:   08/16/16 2152 08/16/16 2155 08/16/16 2254 08/16/16 2300  BP:  (!) 150/118  196/93  Pulse:  (!) 123  108  Resp:  24  (!) 28  Temp:  97.5 F (36.4 C)    TempSrc:  Axillary    SpO2: 98% 96% 94% 97%  Weight:      Height:       General: In moderate acute distress HEENT:       Eyes: PERRL, EOMI, no scleral icterus.       ENT: No discharge from the ears and nose, no pharynx injection, no tonsillar enlargement.        Neck: No JVD, no bruit, no mass felt. Heme: No  neck lymph node enlargement. Cardiac: S1/S2, RRR, No murmurs, No gallops or rubs. Respiratory: Has diffuse wheezing bilaterally. Also has some fine crackles. GI: Soft, nondistended, nontender, no rebound pain, no organomegaly, BS present. GU: No hematuria Ext: No pitting leg edema bilaterally. 2+DP/PT pulse bilaterally. Musculoskeletal: No joint deformities, No joint redness or warmth, no limitation of ROM in spin. Skin: No rashes.  Neuro: Alert, oriented X3, cranial nerves II-XII grossly intact, moves all extremities normally.  Psych: Patient is not psychotic, no suicidal or hemocidal ideation.  Labs on Admission: I have personally reviewed following labs and imaging studies  CBC:  Recent Labs Lab 08/16/16 2211  WBC 7.8  NEUTROABS 4.6  HGB 10.4*  HCT 31.2*  MCV 82.1  PLT 48*   Basic Metabolic Panel:  Recent Labs Lab 08/16/16 2211  NA 132*  K 3.4*  CL 88*  CO2 30  GLUCOSE 124*  BUN 20  CREATININE 5.81*  CALCIUM 9.0   GFR: Estimated Creatinine Clearance: 13.7 mL/min (by C-G formula based on SCr of 5.81 mg/dL (H)). Liver Function Tests: No results for input(s): AST, ALT, ALKPHOS, BILITOT, PROT, ALBUMIN in the last 168 hours. No results for input(s): LIPASE, AMYLASE in the last 168 hours. No results for input(s): AMMONIA in the last 168 hours. Coagulation  Profile: No results for input(s): INR, PROTIME in the last 168 hours. Cardiac Enzymes:  Recent Labs Lab 08/16/16 2257  TROPONINI 0.10*   BNP (last 3 results) No results for input(s): PROBNP in the last 8760 hours. HbA1C: No results for input(s): HGBA1C in the last 72 hours. CBG: No results for input(s): GLUCAP in the last 168 hours. Lipid Profile: No results for input(s): CHOL, HDL, LDLCALC, TRIG, CHOLHDL, LDLDIRECT in the last 72 hours. Thyroid Function Tests: No results for input(s): TSH, T4TOTAL, FREET4, T3FREE, THYROIDAB in the last 72 hours. Anemia Panel: No results for input(s): VITAMINB12, FOLATE, FERRITIN, TIBC, IRON, RETICCTPCT in the last 72 hours. Urine analysis:    Component Value Date/Time   COLORURINE YELLOW 07/16/2011 1619   APPEARANCEUR CLEAR 07/16/2011 1619   LABSPEC 1.018 07/16/2011 1619   PHURINE 7.5 07/16/2011 1619   GLUCOSEU 250 (A) 07/16/2011 1619   HGBUR MODERATE (A) 07/16/2011 1619   BILIRUBINUR SMALL (A) 07/16/2011 1619   KETONESUR NEGATIVE 07/16/2011 1619   PROTEINUR >300 (A) 07/16/2011 1619   UROBILINOGEN 1.0 07/16/2011 1619   NITRITE NEGATIVE 07/16/2011 1619   LEUKOCYTESUR SMALL (A) 07/16/2011 1619   Sepsis Labs: @LABRCNTIP (procalcitonin:4,lacticidven:4) )No results found for this or any previous visit (from the past 240 hour(s)).   Radiological Exams on Admission: Dg Chest Port 1 View  Result Date: 08/16/2016 CLINICAL DATA:  Shortness of breath EXAM: PORTABLE CHEST 1 VIEW COMPARISON:  07/11/2016 FINDINGS: AP portable semi -erect view chest demonstrates mild cardiomegaly. Central vascular congestion is present. Right greater than left interstitial and alveolar opacities suspicious for pulmonary edema. Suspect tiny effusions. No pneumothorax. IMPRESSION: Cardiomegaly with central vascular congestion and right greater than left interstitial and alveolar opacities suspicious for pulmonary edema. Probable tiny effusions. Electronically Signed   By:  Donavan Foil M.D.   On: 08/16/2016 22:58     EKG: Independently reviewed.  Sinus tachycardia, QTC 610, PVC, LVH   Assessment/Plan Principal Problem:   Acute respiratory failure with hypoxia (HCC) Active Problems:   Alcohol abuse   TOBACCO ABUSE   Chronic hepatitis C without hepatic coma (HCC)   Thrombocytopenia (HCC)   End stage renal disease (HCC)   COPD exacerbation (HCC)  Hypertensive urgency   Acute bronchitis   Elevated troponin   Acute respiratory failure with hypoxia [facility]): Oxygen desaturated to 93% on room air. Etiology is not clear. Patient has productive cough, wheezing and shortness rest, likely due to acute bronchitis versus possible COPD. Patient does not carry COPD diagnosis, but he has been smoking for a long time, indicating possible COPD.  -will admit patient to SDU as inpt - pt refused BiPAP -check ABG -Nebulizers: scheduled Atrovent and prn Xopenex Nebs -Solu-Medrol 60 mg IV tid -Oral azithromycin for 5 days.  -Mucinex for cough  -Urine S. pneumococcal antigen -Follow up blood culture x2, sputum culture, Flu pcr  Chronic hepatitis C without hepatic coma : -continue Mavyret  ESRD-HD (MWF): pt had full course of HD today.  Potassium 3.4, creatinine 5.81, BUN 20, -left message to renal box for HD. -continue Renvela  Tobacco abuse and Alcohol abuse: -Did counseling about importance of quitting smoking -Nicotine patch -Did counseling about the importance of quitting drinking -CIWA protocol  Thrombocytopenia ([place]): Platelet is 48. No bleeding tendency. Most likely due to hepatitis. -Follow up by CBC  Hypertensive urgency: Bp 196/93-->154/81 -IV hydralazine when necessary -Continue amlodipine, bisoprolol, lisinopril,  Elevated troponin: trop 0.10. No chest pain Likely due to demand ischemia secondary to hypertensive urgency. Decreased clearance secondary to ESRD may have also partially contributed.  - cycle CE q6 x3 and repeat her EKG in the am  -  aspirin - Risk factor stratification: will check FLP, UDS and A1C   DVT ppx: SCD Code Status: Full code Family Communication: None at bed side. Disposition Plan:  Anticipate discharge back to previous home environment Consults called:  none Admission status: SDU/inpation       Date of Service 08/17/2016    Ivor Costa [facility] Pager [phone]  If 7PM-7AM, please contact night-coverage [email or web address] Password [email or web address] 08/17/2016, 1:24 AM

## 2016-08-17 NOTE — Telephone Encounter (Signed)
No show letter mailed to patient. 

## 2016-08-17 NOTE — Consult Note (Signed)
[facility] Renal Consultation Note    Indication for Consultation:  Management of ESRD/hemodialysis; anemia, hypertension/volume and secondary hyperparathyroidism PCP: [name], MD  Referring physician:  Ivor Costa, MD  HPI: [name] is a 56 y.o. male with ESRD secondary to HTN on HD since December 2012 MWF at the [facility].  PMHx is significant for polysubstance abuse, Hep B and C +, noncompliance with dialysis.  He recently had plication of his left upper AVF and cannot stich under the incision and show stick away from it.  He last dialyzed yesterday with a net UF of 2.6, post HD wt 65 (EDW 64.5) He ran 3.5 of his 4 hours treatment with post HD BP of 218/131 sitting and 190/106 standing P 93, afebrile.   He stayed on longer than usual. He felt somewhat bad yesterday, but nonspecific.  He presented last evening to the EDW with cough, SOB and wheezing. CXR showed CM, vascular congestion, pulmonary edema.   WBC was 7.8 with normal diff hgb 10.4 stable plts 48 low but in usual range. He was afebrile with an elevated BP that is in his usual range. Resp  ABG pH 7.5 CO2 56 pCO2 40.5 Drug screen I negative. Trop 0.1, 0.11 and 0.22 EKG ST with  LBBB   He thinks he could have lost weight. He dislikes wearing O2 and sats are currently 95 % on room air He feels claustrophobic in ED room with door closed when he can't see out.  He has called a friend to get his Hep C medicine because it is not carried here.   Past Medical History:  Diagnosis Date  . Adenomatous colon polyp    tubular  . Anemia   . Anxiety   . Atherosclerosis of aorta ([place])   . Cardiomegaly   . Chest pain    DATE UNKNOWN, C/O PERIODICALLY  . Chronic kidney disease    dialysis - M/W/F  . Cocaine abuse   . COPD exacerbation ([place]) 08/17/2016  . Dialysis patient ([place])    Monday-Wednesday-Friday  . GERD (gastroesophageal reflux disease)    DATE UNKNOWN  . Hemorrhoids   . Hepatitis B, chronic ([place])   .  Hepatitis C   . Hyperkalemia   . Hypertension   . Metabolic bone disease    Patient denies  . Pneumonia   . Pulmonary edema   . Renal disorder   . Renal insufficiency   . Shortness of breath dyspnea    " for the last past year with this dialysis"  . Tubular adenoma of colon    Past Surgical History:  Procedure Laterality Date  . AV FISTULA PLACEMENT  2012   BELIEVED WAS PLACED IN JUNE  . HEMORRHOID BANDING    . LIGATION OF ARTERIOVENOUS  FISTULA Left 01/09/625   Procedure: Plication of Left Arm Arteriovenous Fistula;  Surgeon: Elam Dutch, MD;  Location: [facility];  Service: Vascular;  Laterality: Left;  . REVISON OF ARTERIOVENOUS FISTULA Left 9/48/5462   Procedure: PLICATION OF DISTAL ANEURYSMAL SEGEMENT OF LEFT UPPER ARM ARTERIOVENOUS FISTULA;  Surgeon: Elam Dutch, MD;  Location: [facility];  Service: Vascular;  Laterality: Left;  . REVISON OF ARTERIOVENOUS FISTULA Left 01/12/5008   Procedure: Plication of Left Upper Arm Fistula ;  Surgeon: Waynetta Sandy, MD;  Location[facility];  Service: Vascular;  Laterality: Left;   Family History  Problem Relation Age of Onset  . Heart disease Mother   . Lung cancer Mother   .  Heart disease Father   . Malignant hyperthermia Father   . Throat cancer Sister   . Hypertension Other   . COPD Other   . Colon cancer Neg Hx    Social History:  reports that he has been smoking Cigarettes.  He has a 1.48 pack-year smoking history. He has never used smokeless tobacco. He reports that he does not drink alcohol or use drugs. Allergies  Allergen Reactions  . Aspirin Other (See Comments)    STOMACH PAIN  . Clonidine Derivatives Itching  . Tramadol Itching   Prior to Admission medications   Medication Sig Start Date End Date Taking? Authorizing Provider  albuterol (PROVENTIL HFA;VENTOLIN HFA) 108 (90 Base) MCG/ACT inhaler Inhale 1-2 puffs into the lungs every 6 (six) hours as needed for wheezing or shortness of breath. 07/11/16  Yes  Kinnie Feil, PA-C  amLODipine (NORVASC) 10 MG tablet Take 10 mg by mouth at bedtime.    Yes Historical Provider, MD  aspirin EC 81 MG tablet Take 81 mg by mouth daily.   Yes Historical Provider, MD  bisoprolol (ZEBETA) 10 MG tablet Take 10 mg by mouth daily.   Yes Historical Provider, MD  cinacalcet (SENSIPAR) 30 MG tablet Take 30 mg by mouth daily.   Yes Historical Provider, MD  DULERA 100-5 MCG/ACT AERO Inhale 2 puffs into the lungs 2 (two) times daily as needed for wheezing or shortness of breath.  09/22/15  Yes Historical Provider, MD  Glecaprevir-Pibrentasvir (MAVYRET) 100-40 MG TABS Take 3 tablets by mouth daily. 08/02/16  Yes Thayer Headings, MD  lisinopril (PRINIVIL,ZESTRIL) 20 MG tablet Take 40 mg by mouth daily.   Yes Historical Provider, MD  oxyCODONE-acetaminophen (PERCOCET/ROXICET) 5-325 MG tablet Take 1-2 tablets by mouth every 4 (four) hours as needed for moderate pain. 07/24/16  Yes Kimberly A Trinh, PA-C  polyethylene glycol powder (GLYCOLAX/MIRALAX) powder MIX 17 GM WITH 8 OUNCES OF LIQUID AND TAKE BY MOUTH DAILY 03/22/16  Yes Gatha Mayer, MD  predniSONE (DELTASONE) 50 MG tablet Take 1 tablet daily with breakfast Patient taking differently: Take 50 mg by mouth daily with breakfast.  06/24/16  Yes Nicole Pisciotta, PA-C  sevelamer carbonate (RENVELA) 800 MG tablet Take 1,600-3,200 mg by mouth See admin instructions. Take 3200 mg by mouth 3 times daily with meals and take 1600 mg by mouth with snacks.   Yes Historical Provider, MD  traZODone (DESYREL) 50 MG tablet Take 50-100 mg by mouth at bedtime as needed for sleep.   Yes Historical Provider, MD   Current Facility-Administered Medications  Medication Dose Route Frequency Provider Last Rate Last Dose  . amLODipine (NORVASC) tablet 10 mg  10 mg Oral QHS Ivor Costa, MD      . aspirin EC tablet 81 mg  81 mg Oral Daily Ivor Costa, MD      . azithromycin [facility]) tablet 500 mg  500 mg Oral Daily Ivor Costa, MD       Followed by   . [START ON 08/18/2016] azithromycin (ZITHROMAX) tablet 250 mg  250 mg Oral Daily Ivor Costa, MD      . bisoprolol (ZEBETA) tablet 10 mg  10 mg Oral Daily Ivor Costa, MD      . Derrill Memo ON 08/18/2016] calcitRIOL (ROCALTROL) capsule 0.75 mcg  0.75 mcg Oral Q M,W,F-HD Alric Seton, PA-C      . cinacalcet (SENSIPAR) tablet 30 mg  30 mg Oral Q breakfast Ivor Costa, MD      . Derrill Memo ON 08/18/2016] Darbepoetin  Alfa (ARANESP) injection 100 mcg  100 mcg Intravenous Q Wed-HD Alric Seton, PA-C      . dextromethorphan-guaiFENesin (MUCINEX DM) 30-600 MG per 12 hr tablet 1 tablet  1 tablet Oral BID Ivor Costa, MD      . folic acid (FOLVITE) tablet 1 mg  1 mg Oral Daily Ivor Costa, MD      . Glecaprevir-Pibrentasvir 100-40 MG TABS 3 tablet  3 tablet Oral Daily Ivor Costa, MD      . hydrALAZINE (APRESOLINE) injection 5 mg  5 mg Intravenous Q2H PRN Ivor Costa, MD      . ipratropium (ATROVENT) nebulizer solution 0.5 mg  0.5 mg Nebulization Q6H Ivor Costa, MD   0.5 mg at 08/17/16 0900  . levalbuterol (XOPENEX) nebulizer solution 1.25 mg  1.25 mg Nebulization Q6H Ivor Costa, MD   1.25 mg at 08/17/16 0900  . lisinopril (PRINIVIL,ZESTRIL) tablet 40 mg  40 mg Oral Daily Ivor Costa, MD      . LORazepam (ATIVAN) injection 0-4 mg  0-4 mg Intravenous Q6H Ivor Costa, MD       Followed by  . [START ON 08/19/2016] LORazepam (ATIVAN) injection 0-4 mg  0-4 mg Intravenous Q12H Ivor Costa, MD      . LORazepam (ATIVAN) tablet 1 mg  1 mg Oral Q6H PRN Ivor Costa, MD       Or  . LORazepam (ATIVAN) injection 1 mg  1 mg Intravenous Q6H PRN Ivor Costa, MD      . methylPREDNISolone sodium succinate (SOLU-MEDROL) 125 mg/2 mL injection 60 mg  60 mg Intravenous Q8H Ivor Costa, MD   60 mg at 08/17/16 0900  . multivitamin with minerals tablet 1 tablet  1 tablet Oral Daily Ivor Costa, MD      . nicotine (NICODERM CQ - dosed in mg/24 hours) patch 21 mg  21 mg Transdermal Daily Ivor Costa, MD      . oxyCODONE-acetaminophen (PERCOCET/ROXICET) 5-325 MG per tablet  1-2 tablet  1-2 tablet Oral Q4H PRN Ivor Costa, MD   2 tablet at 08/17/16 0301  . polyethylene glycol (MIRALAX / GLYCOLAX) packet 17 g  1 packet Oral Daily PRN Ivor Costa, MD      . sevelamer carbonate (RENVELA) tablet 1,600 mg  1,600 mg Oral PRN Ivor Costa, MD      . sevelamer carbonate (RENVELA) tablet 3,200 mg  3,200 mg Oral TID WC Ivor Costa, MD   3,200 mg at 08/17/16 0905  . thiamine (VITAMIN B-1) tablet 100 mg  100 mg Oral Daily Ivor Costa, MD       Or  . thiamine (B-1) injection 100 mg  100 mg Intravenous Daily Ivor Costa, MD      . traZODone (DESYREL) tablet 50-100 mg  50-100 mg Oral QHS PRN Ivor Costa, MD      . zolpidem (AMBIEN) tablet 5 mg  5 mg Oral QHS PRN Ivor Costa, MD       Current Outpatient Prescriptions  Medication Sig Dispense Refill  . albuterol (PROVENTIL HFA;VENTOLIN HFA) 108 (90 Base) MCG/ACT inhaler Inhale 1-2 puffs into the lungs every 6 (six) hours as needed for wheezing or shortness of breath. 1 Inhaler 0  . amLODipine (NORVASC) 10 MG tablet Take 10 mg by mouth at bedtime.     Marland Kitchen aspirin EC 81 MG tablet Take 81 mg by mouth daily.    . bisoprolol (ZEBETA) 10 MG tablet Take 10 mg by mouth daily.    . cinacalcet (SENSIPAR) 30 MG tablet  Take 30 mg by mouth daily.    . DULERA 100-5 MCG/ACT AERO Inhale 2 puffs into the lungs 2 (two) times daily as needed for wheezing or shortness of breath.     . Glecaprevir-Pibrentasvir (MAVYRET) 100-40 MG TABS Take 3 tablets by mouth daily. 84 tablet 2  . lisinopril (PRINIVIL,ZESTRIL) 20 MG tablet Take 40 mg by mouth daily.    Marland Kitchen oxyCODONE-acetaminophen (PERCOCET/ROXICET) 5-325 MG tablet Take 1-2 tablets by mouth every 4 (four) hours as needed for moderate pain. 6 tablet 0  . polyethylene glycol powder (GLYCOLAX/MIRALAX) powder MIX 17 GM WITH 8 OUNCES OF LIQUID AND TAKE BY MOUTH DAILY 527 g 0  . predniSONE (DELTASONE) 50 MG tablet Take 1 tablet daily with breakfast (Patient taking differently: Take 50 mg by mouth daily with breakfast. ) 5 tablet 0   . sevelamer carbonate (RENVELA) 800 MG tablet Take 1,600-3,200 mg by mouth See admin instructions. Take 3200 mg by mouth 3 times daily with meals and take 1600 mg by mouth with snacks.    . traZODone (DESYREL) 50 MG tablet Take 50-100 mg by mouth at bedtime as needed for sleep.     Labs: Basic Metabolic Panel:  Recent Labs Lab 08/16/16 2211  NA 132*  K 3.4*  CL 88*  CO2 30  GLUCOSE 124*  BUN 20  CREATININE 5.81*  CALCIUM 9.0   CBC:  Recent Labs Lab 08/16/16 2211  WBC 7.8  NEUTROABS 4.6  HGB 10.4*  HCT 31.2*  MCV 82.1  PLT 48*   Cardiac Enzymes:  Recent Labs Lab 08/16/16 2257 08/17/16 0131 08/17/16 0834  TROPONINI 0.10* 0.11* 0.22*  Studies/Results: Dg Chest Port 1 View  Result Date: 08/16/2016 CLINICAL DATA:  Shortness of breath EXAM: PORTABLE CHEST 1 VIEW COMPARISON:  07/11/2016 FINDINGS: AP portable semi -erect view chest demonstrates mild cardiomegaly. Central vascular congestion is present. Right greater than left interstitial and alveolar opacities suspicious for pulmonary edema. Suspect tiny effusions. No pneumothorax. IMPRESSION: Cardiomegaly with central vascular congestion and right greater than left interstitial and alveolar opacities suspicious for pulmonary edema. Probable tiny effusions. Electronically Signed   By: Donavan Foil M.D.   On: 08/16/2016 22:58    ROS: As per HPI otherwise negative.  Physical Exam: Vitals:   08/17/16 0845 08/17/16 0917 08/17/16 0959 08/17/16 1015  BP: 179/89 176/88 (!) 202/102 180/86  Pulse: 102 106 113   Resp: 17 (!) 28 25 15   Temp:      TempSrc:      SpO2: 94% 94% 95%   Weight:      Height:         General:  Ill appearing AAM Head: NCAT sclera not icteric MMM, face puffy Neck: Supple.  Lungs: very poor expansion, bilateral crackles, slight wheezes, breathing a little labored Heart:  Tachy rate ~110 -120s reg Abdomen: soft NT + BS Lower extremities:without edema or ischemic changes, no open wounds  Neuro: A &  O  X 3. Moves all extremities spontaneously. Psych:  Responds to questions appropriately with a normal affect. Dialysis Access: left upper AVF + bruit   Dialysis Orders: MWF [place] EDW 64.5 2k 2Ca 450/800 4 hr heparin 3000 venofer 50 per week Mircera 150 q 2  Wk last 1/24 calcitriol 0.75 Recent labs:   hgb 10.5 no Fe studies - 47% tsat in Dec, Ca and P ok iPTH 571  Assessment/Plan: 1. Acute respiratory failure with hypoxia - lilkely multi factorial - acute bronchitis vs exacerbation of COPD with excess volume;  plan HD today for volume and again Wed to get back on schedule; Abtx, nebs per primary 2. ESRD -  MWF - extra HD today due to volume - back on schedule tomorrow K 3.4 - 4 K bath today 3. Hypertension/volume  - poorly controlled - resistant to lowering of EDW in the past; continue usual med; contionue current meds and titrate volume down for better control 4. Anemia  - hgb 10.4 - due for redose ESA on Wed - redose at 100 Aranesp - continue weekly Fe 5. Metabolic bone disease -  Continue binders and calcitriol 6. Nutrition -renal diet/vits 7. Hx of polysubstance abuse - negative drug screen; continue to encourage smoking cessation 8. Thrombocytopenia - plts lower than usual - outpt plts range 80- 110 on average 9. Hep C and B + - requires - isolation HD due to Hep B status; to bring Hep C meds from home for use here  10. + trop - ? Demand ischemia - per primary  Myriam Jacobson, PA-C [place] [phone] 08/17/2016, 11:25 AM   Pt seen, examined and agree w A/P as above. ESRD patient with SOB and pulm edema, prob has lost weight.  Plan urgent HD today and plan for HD tomorrow as well if pt will agree. Get f/u CXR tonight. [name] MD [employer] pager [phone]   08/17/2016, 5:02 PM

## 2016-08-17 NOTE — Care Management Note (Signed)
Case Management Note  Patient Details  Name: [name] MRN: [id number] Date of Birth: 28-Apr-1961  Subjective/Objective:                  From home.  /56 y.o. male with medical history significant of hypertension, GERD, anxiety, HCV, HBV, ESRD-HD (MMF), polysubstance abuse (cocaine, alcohol and tobacco), anemia, dCHF, who presents with shortness breath, cough, and wheezing.  Action/Plan: Admit status INPATIENT (Acute respiratory failure with hypoxia); anticipate discharge [place].   Expected Discharge Date:   (unsure)               Expected Discharge Plan:  [place]  In-House Referral:  NA  Discharge planning Services  CM Consult  Post Acute Care Choice:    Choice offered to:     DME Arranged:    DME Agency:     HH Arranged:    HH Agency:     Status of Service:  In process, will continue to follow  If discussed at Long Length of Stay Meetings, dates discussed:    Additional Comments: HD patient, please notify [facility] [phone]) when pt ready for discharge. Pt goes to [facility] M-W-F.  Fuller Mandril, RN 08/17/2016, 10:35 AM

## 2016-08-17 NOTE — Progress Notes (Signed)
Patient arrived to unit from dialysis, no complaints. Family brought him [place] food. Patient ambulates well, eating right now, Oriented to unit and call bell within reach.

## 2016-08-17 NOTE — Procedures (Signed)
  I was present at this dialysis session, have reviewed the session itself and made  appropriate changes [name] MD [place] pager [phone]   08/17/2016, 5:01 PM

## 2016-08-17 NOTE — ED Notes (Signed)
Pt called out stating he had to go to the restroom, attempted to give pt a urinal or use the bedside commode, pt stated that was not good enough and refused to go unless he could go to the restroom. Pt stated we "do not know whats best for me and you will not make me a handicap". Pt stated he was able to get up and walk, when pt stood, he walked to the door and said he could not majke it to the restroom. Pt brought a wheelchair and wheeled to the restroom.

## 2016-08-17 NOTE — ED Notes (Signed)
Pt called from restroom. Pt stood behind wheelchair and refused to sit in it. Pt wheeled the wheelchair back to the room.

## 2016-08-17 NOTE — Progress Notes (Signed)
PROGRESS NOTE    [name]  GEX:[id number] DOB: 1961-04-10 DOA: 08/16/2016 PCP: [name], MD   Brief Narrative: 56 y.o. male with medical history significant of hypertension, GERD, anxiety, HCV, HBV, ESRD-HD (MMF), polysubstance abuse (cocaine, alcohol and tobacco), anemia, dCHF, who presents with shortness breath, cough, and wheezing.  Assessment & Plan:  #Acute hypoxic respiratory failure: Due to acute pulmonary edema/volume overload versus presumed bronchitis. Patient has no diagnosis of COPD. He is actively smoker. -I believe patient's symptoms are mostly contributed by pulmonary edema as he was not receiving full dialysis treatment outpatient. Discussed with nephrology team today. Plan for hemodialysis today. -Started on azithromycin, Mucinex, bronchodilators and Solu-Medrol (60 mg tid)on admission. I will discontinue Solu-Medrol and switched to oral prednisone. May be able to discontinue if patient clinically improved after hemodialysis treatment. -Follow up pending culture results. -Able to wean to room air today. Patient is clinically improving. Expect to improve after hemodialysis treatment. I think patient will be acceptable to telemetry floor this time. Patient was waiting in the ER for stepdown bed for many hours.  #ESRD on hemodialysis: Patient has a fistula. Also with fluid overload. Hemodialysis treatment likely today and tomorrow as per nephrologist.  #Hypertensive urgency mostly contributed by volume overload: Plan for him dialysis today as per nephrologist. Resume home medication. Monitor blood pressure. Discussed with the patient's nurse in ER.  #Chronic hepatitis C without coma: Continue home dose  #Tobacco and alcohol abuse: No sign of withdrawal. Continue current past. Education provided to the patient.  #Thrombocytopenia, chronic likely in the setting of liver disease: No sign of bleeding. Monitor CBC.  #Elevated troponin likely in the setting of ESRD.  No chest pain. Continue to monitor.    Principal Problem:   Acute respiratory failure with hypoxia (HCC) Active Problems:   Alcohol abuse   TOBACCO ABUSE   Chronic hepatitis C without hepatic coma (HCC)   Thrombocytopenia (HCC)   End stage renal disease (HCC)   COPD exacerbation (HCC)   Hypertensive urgency   Acute bronchitis   Elevated troponin   Respiratory failure (HCC) DVT prophylaxis: SCD. No anticoagulant isn't because of thrombocytopenia Code Status: Full code Family Communication: No family present at bedside Disposition Plan: Going to telemetry floor. Likely discharge home in 1-2 days    Consultants:   Nephrologist  Procedures: None Antimicrobials: Azithromycin  Subjective: Patient was seen and examined at bedside. Reported headache, mild shortness of breath. Denied chest pain, cough well-developed, nausea or vomiting.   Objective: Vitals:   08/17/16 1336 08/17/16 1337 08/17/16 1420 08/17/16 1452  BP: (!) 167/105  172/93 (!) 183/102  Pulse: 107  106 106  Resp:  22 15 24   Temp:      TempSrc:      SpO2: 93%  95% 96%  Weight:      Height:       No intake or output data in the 24 hours ending 08/17/16 1510 Filed Weights   08/16/16 2151  Weight: 66.7 kg (147 lb)    Examination:  General exam: Appears calm and comfortable  Respiratory system: Bilateral diffuse wheeze. Respiratory effort normal.  Cardiovascular system: S1 & S2 heard, RRR.  No pedal edema. Gastrointestinal system: Abdomen is nondistended, soft and nontender. Normal bowel sounds heard. Central nervous system: Alert and oriented. No focal neurological deficits. Extremities: Symmetric 5 x 5 power. Skin: No rashes, lesions or ulcers Psychiatry: Judgement and insight appear normal. Mood & affect appropriate.     Data Reviewed: I have personally  reviewed following labs and imaging studies  CBC:  Recent Labs Lab 08/16/16 2211  WBC 7.8  NEUTROABS 4.6  HGB 10.4*  HCT 31.2*  MCV 82.1    PLT 48*   Basic Metabolic Panel:  Recent Labs Lab 08/16/16 2211  NA 132*  K 3.4*  CL 88*  CO2 30  GLUCOSE 124*  BUN 20  CREATININE 5.81*  CALCIUM 9.0   GFR: Estimated Creatinine Clearance: 13.7 mL/min (by C-G formula based on SCr of 5.81 mg/dL (H)). Liver Function Tests: No results for input(s): AST, ALT, ALKPHOS, BILITOT, PROT, ALBUMIN in the last 168 hours. No results for input(s): LIPASE, AMYLASE in the last 168 hours. No results for input(s): AMMONIA in the last 168 hours. Coagulation Profile: No results for input(s): INR, PROTIME in the last 168 hours. Cardiac Enzymes:  Recent Labs Lab 08/16/16 2257 08/17/16 0131 08/17/16 0834  TROPONINI 0.10* 0.11* 0.22*   BNP (last 3 results) No results for input(s): PROBNP in the last 8760 hours. HbA1C: No results for input(s): HGBA1C in the last 72 hours. CBG: No results for input(s): GLUCAP in the last 168 hours. Lipid Profile:  Recent Labs  08/17/16 0134  CHOL 103  HDL 34*  LDLCALC 50  TRIG 97  CHOLHDL 3.0   Thyroid Function Tests: No results for input(s): TSH, T4TOTAL, FREET4, T3FREE, THYROIDAB in the last 72 hours. Anemia Panel: No results for input(s): VITAMINB12, FOLATE, FERRITIN, TIBC, IRON, RETICCTPCT in the last 72 hours. Sepsis Labs: No results for input(s): PROCALCITON, LATICACIDVEN in the last 168 hours.  No results found for this or any previous visit (from the past 240 hour(s)).       Radiology Studies: Dg Chest Port 1 View  Result Date: 08/16/2016 CLINICAL DATA:  Shortness of breath EXAM: PORTABLE CHEST 1 VIEW COMPARISON:  07/11/2016 FINDINGS: AP portable semi -erect view chest demonstrates mild cardiomegaly. Central vascular congestion is present. Right greater than left interstitial and alveolar opacities suspicious for pulmonary edema. Suspect tiny effusions. No pneumothorax. IMPRESSION: Cardiomegaly with central vascular congestion and right greater than left interstitial and alveolar  opacities suspicious for pulmonary edema. Probable tiny effusions. Electronically Signed   By: Donavan Foil M.D.   On: 08/16/2016 22:58        Scheduled Meds: . amLODipine  10 mg Oral QHS  . aspirin EC  81 mg Oral Daily  . [START ON 08/18/2016] azithromycin  250 mg Oral Daily  . bisoprolol  10 mg Oral Daily  . [START ON 08/18/2016] calcitRIOL  0.75 mcg Oral Q M,W,F-HD  . cinacalcet  30 mg Oral Q breakfast  . [START ON 08/18/2016] darbepoetin (ARANESP) injection - DIALYSIS  100 mcg Intravenous Q Wed-HD  . dextromethorphan-guaiFENesin  1 tablet Oral BID  . [START ON 08/18/2016] ferric gluconate (FERRLECIT/NULECIT) IV  62.5 mg Intravenous Q Wed-HD  . folic acid  1 mg Oral Daily  . Glecaprevir-Pibrentasvir  3 tablet Oral Daily  . ipratropium  0.5 mg Nebulization Q6H  . levalbuterol  1.25 mg Nebulization Q6H  . lisinopril  40 mg Oral Daily  . LORazepam  0-4 mg Intravenous Q6H   Followed by  . [START ON 08/19/2016] LORazepam  0-4 mg Intravenous Q12H  . methylPREDNISolone (SOLU-MEDROL) injection  60 mg Intravenous Q8H  . multivitamin with minerals  1 tablet Oral Daily  . nicotine  21 mg Transdermal Daily  . sevelamer carbonate  3,200 mg Oral TID WC  . thiamine  100 mg Oral Daily   Or  .  thiamine  100 mg Intravenous Daily   Continuous Infusions:   LOS: 0 days    Dron Tanna Furry, MD [facility] Pager [phone]  If 7PM-7AM, please contact night-coverage [email or web address] Password [email or web address] 08/17/2016, 3:10 PM

## 2016-08-17 NOTE — ED Notes (Signed)
Patient brought glecaprevir-pirbrentasvir from home at took daily dose.

## 2016-08-18 ENCOUNTER — Other Ambulatory Visit (HOSPITAL_COMMUNITY): Payer: Self-pay

## 2016-08-18 ENCOUNTER — Encounter (HOSPITAL_COMMUNITY): Payer: Self-pay | Admitting: General Practice

## 2016-08-18 ENCOUNTER — Inpatient Hospital Stay (HOSPITAL_COMMUNITY): Payer: Medicare Other

## 2016-08-18 DIAGNOSIS — J9601 Acute respiratory failure with hypoxia: Secondary | ICD-10-CM

## 2016-08-18 LAB — BRAIN NATRIURETIC PEPTIDE: B Natriuretic Peptide: >4500 pg/mL — ABNORMAL HIGH (ref 0.0–100.0)

## 2016-08-18 LAB — HEMOGLOBIN A1C
Hgb A1c MFr Bld: 4.9 % (ref 4.8–5.6)
Mean Plasma Glucose: 94 mg/dL

## 2016-08-18 MED ORDER — HYDRALAZINE HCL 20 MG/ML IJ SOLN: 10.0000 mg | Freq: Four times a day (QID) | INTRAMUSCULAR | Status: DC | PRN

## 2016-08-18 MED ORDER — ALBUTEROL SULFATE (2.5 MG/3ML) 0.083% IN NEBU: 2.5000 mg | INHALATION_SOLUTION | RESPIRATORY_TRACT | Status: DC | PRN

## 2016-08-18 NOTE — Progress Notes (Addendum)
BP 169/98 after BP med.  Pt asymptomatic.  Made pt aware of BP after med.  Will continue to monitor.  Karie Kirks, Therapist, sports.

## 2016-08-18 NOTE — Discharge Summary (Signed)
AMA  Patient at this time expresses desire to leave the Hospital immidiately, patient has been warned that this is not Medically advisable at this time, and can result in Medical complications like Death and Disability, patient understands and accepts the risks involved and assumes full responsibilty of this decision.   [name] M.D on 08/18/2016 at 9:35 AM  [facility]  Time < 30 minutes  Last Note Below    PROGRESS NOTE    [name]  YIR:[id number] DOB: [birth year] DOA: 08/16/2016 PCP: [name], MD   Brief Narrative: 56 y.o. male with medical history significant of hypertension, GERD, anxiety, HCV, HBV, ESRD-HD (MMF), polysubstance abuse (cocaine, alcohol and tobacco), anemia, dCHF, who presents with shortness breath, cough, and wheezing.  Assessment & Plan:  #Acute hypoxic respiratory failure: Due to acute pulmonary edema/volume overload versus presumed bronchitis. Patient has no diagnosis of COPD. He is actively smoker. -I believe patient's symptoms are mostly contributed by pulmonary edema as he was not receiving full dialysis treatment outpatient. Discussed with nephrology team today. Plan for hemodialysis today. -Started on azithromycin, Mucinex, bronchodilators and Solu-Medrol (60 mg tid)on admission. I will discontinue Solu-Medrol and switched to oral prednisone. May be able to discontinue if patient clinically improved after hemodialysis treatment. -Follow up pending culture results. -Able to wean to room air today. Patient is clinically improving. Expect to improve after hemodialysis treatment. I think patient will be acceptable to telemetry floor this time. Patient was waiting in the ER for stepdown bed for many hours.  #ESRD on hemodialysis: Patient has a fistula. Also with fluid overload.  Hemodialysis treatment likely today and tomorrow as per nephrologist.  #Hypertensive urgency mostly contributed by volume overload: Plan for him dialysis today as per nephrologist. Resume home medication. Monitor blood pressure. Discussed with the patient's nurse in ER.  #Chronic hepatitis C without coma: Continue home dose  #Tobacco and alcohol abuse: No sign of withdrawal. Continue current past. Education provided to the patient.  #Thrombocytopenia, chronic likely in the setting of liver disease: No sign of bleeding. Monitor CBC.  #Elevated troponin likely in the setting of ESRD. No chest pain. Continue to monitor.    DVT prophylaxis: SCD. No anticoagulant isn't because of thrombocytopenia Code Status: Full code Family Communication: No family present at bedside Disposition Plan: Going to telemetry floor. Likely discharge home in 1-2 days    Consultants:   Nephrologist  Procedures: None Antimicrobials: Azithromycin  Subjective: Patient was seen and examined at bedside. Reported headache, mild shortness of breath. Denied chest pain, cough well-developed, nausea or vomiting.   Objective: Vitals:   08/18/16 0117 08/18/16 0514 08/18/16 0831 08/18/16 0852  BP: (!) 178/82 (!) 186/93  (!) 181/103  Pulse: 88 88    Resp: 16 18    Temp: 97.4 F (36.3 C) 97.4 F (36.3 C)    TempSrc: Oral Oral    SpO2: 94% 100% 100%   Weight:  63.7 kg (140 lb 8 oz)    Height:        Intake/Output Summary (Last 24 hours) at 08/18/16 0935 Last data filed at 08/18/16  0600  Gross per 24 hour  Intake              440 ml  Output             1239 ml  Net             -799 ml   Filed Weights   08/17/16 1815 08/17/16 1935 08/18/16 0514  Weight: 64 kg (141 lb 1.5 oz) 64.1 kg (141 lb 6.4 oz) 63.7 kg (140 lb 8 oz)    Examination:  General exam: Appears calm and comfortable  Respiratory system: Bilateral diffuse wheeze. Respiratory effort normal.  Cardiovascular system: S1 & S2 heard, RRR.  No  pedal edema. Gastrointestinal system: Abdomen is nondistended, soft and nontender. Normal bowel sounds heard. Central nervous system: Alert and oriented. No focal neurological deficits. Extremities: Symmetric 5 x 5 power. Skin: No rashes, lesions or ulcers Psychiatry: Judgement and insight appear normal. Mood & affect appropriate.     Data Reviewed: I have personally reviewed following labs and imaging studies  CBC:  Recent Labs Lab 08/16/16 2211 08/16/16 2258  WBC 7.8  --   NEUTROABS 4.6  --   HGB 10.4* 11.2*  HCT 31.2* 33.0*  MCV 82.1  --   PLT 48*  --    Basic Metabolic Panel:  Recent Labs Lab 08/16/16 2211 08/16/16 2258  NA 132* 138  K 3.4* 3.5  CL 88* 93*  CO2 30  --   GLUCOSE 124* 122*  BUN 20 25*  CREATININE 5.81* 5.90*  CALCIUM 9.0  --    GFR: Estimated Creatinine Clearance: 12.9 mL/min (by C-G formula based on SCr of 5.9 mg/dL (H)). Liver Function Tests: No results for input(s): AST, ALT, ALKPHOS, BILITOT, PROT, ALBUMIN in the last 168 hours. No results for input(s): LIPASE, AMYLASE in the last 168 hours. No results for input(s): AMMONIA in the last 168 hours. Coagulation Profile: No results for input(s): INR, PROTIME in the last 168 hours. Cardiac Enzymes:  Recent Labs Lab 08/16/16 2257 08/17/16 0131 08/17/16 0834 08/17/16 2125  TROPONINI 0.10* 0.11* 0.22* 0.33*   BNP (last 3 results) No results for input(s): PROBNP in the last 8760 hours. HbA1C: No results for input(s): HGBA1C in the last 72 hours. CBG: No results for input(s): GLUCAP in the last 168 hours. Lipid Profile:  Recent Labs  08/17/16 0134  CHOL 103  HDL 34*  LDLCALC 50  TRIG 97  CHOLHDL 3.0   Thyroid Function Tests: No results for input(s): TSH, T4TOTAL, FREET4, T3FREE, THYROIDAB in the last 72 hours. Anemia Panel: No results for input(s): VITAMINB12, FOLATE, FERRITIN, TIBC, IRON, RETICCTPCT in the last 72 hours. Sepsis Labs: No results for input(s): PROCALCITON,  LATICACIDVEN in the last 168 hours.  Recent Results (from the past 240 hour(s))  Culture, blood (routine x 2) Call MD if unable to obtain prior to antibiotics being given     Status: None (Preliminary result)   Collection Time: 08/17/16  1:35 AM  Result Value Ref Range Status   Specimen Description BLOOD RIGHT HAND  Final   Special Requests AEROBIC BOTTLE ONLY 5ML  Final   Culture NO GROWTH < 24 HOURS  Final   Report Status PENDING  Incomplete  Culture, blood (routine x 2) Call MD if unable to obtain prior to antibiotics being given     Status: None (Preliminary result)   Collection Time: 08/17/16  1:47 AM  Result Value Ref Range Status   Specimen Description BLOOD RIGHT FOREARM  Final   Special Requests IN PEDIATRIC BOTTLE 3ML  Final   Culture NO GROWTH < 24 HOURS  Final   Report Status PENDING  Incomplete  MRSA PCR Screening     Status: None   Collection Time: 08/17/16  8:30 PM  Result Value Ref Range Status   MRSA by PCR NEGATIVE NEGATIVE Final    Comment:        The GeneXpert MRSA Assay (FDA approved for NASAL specimens only), is one component of a comprehensive MRSA colonization surveillance program. It is not intended to diagnose MRSA infection nor to guide or monitor treatment for MRSA infections.          Radiology Studies: Dg Chest 2 View  Result Date: 08/17/2016 Roney Jaffe, MD     08/17/2016  5:01 PM I was present at this dialysis session, have reviewed the session itself and made  appropriate changes [name] MD [place] pager [phone]  08/17/2016, 5:01 PM   Dg Chest Port 1 View  Result Date: 08/18/2016 CLINICAL DATA:  Follow-up pulmonary edema EXAM: PORTABLE CHEST 1 VIEW COMPARISON:  08/16/2016 FINDINGS: Cardiac shadow is enlarged but stable. Mild vascular congestion is again seen although the overall degree of aeration has improved consistent with some resolution of alveolar edema. No focal infiltrate or sizable effusion is noted.  IMPRESSION: Persistent CHF although improved from the prior study. Electronically Signed   By: Inez Catalina M.D.   On: 08/18/2016 08:37   Dg Chest Port 1 View  Result Date: 08/16/2016 CLINICAL DATA:  Shortness of breath EXAM: PORTABLE CHEST 1 VIEW COMPARISON:  07/11/2016 FINDINGS: AP portable semi -erect view chest demonstrates mild cardiomegaly. Central vascular congestion is present. Right greater than left interstitial and alveolar opacities suspicious for pulmonary edema. Suspect tiny effusions. No pneumothorax. IMPRESSION: Cardiomegaly with central vascular congestion and right greater than left interstitial and alveolar opacities suspicious for pulmonary edema. Probable tiny effusions. Electronically Signed   By: Donavan Foil M.D.   On: 08/16/2016 22:58        Scheduled Meds: . amLODipine  10 mg Oral QHS  . aspirin EC  81 mg Oral Daily  . azithromycin  250 mg Oral Daily  . bisoprolol  10 mg Oral Daily  . calcitRIOL  0.75 mcg Oral Q M,W,F-HD  . cinacalcet  30 mg Oral Q breakfast  . darbepoetin (ARANESP) injection - DIALYSIS  100 mcg Intravenous Q Wed-HD  . dextromethorphan-guaiFENesin  1 tablet Oral BID  . ferric gluconate (FERRLECIT/NULECIT) IV  62.5 mg Intravenous Q Wed-HD  . folic acid  1 mg Oral Daily  . Glecaprevir-Pibrentasvir  3 tablet Oral Daily  . ipratropium  0.5 mg Nebulization TID  . levalbuterol  1.25 mg Nebulization TID  . lisinopril  40 mg Oral Daily  . LORazepam  0-4 mg Intravenous Q6H   Followed by  . [START ON 08/19/2016] LORazepam  0-4 mg Intravenous Q12H  . multivitamin with minerals  1 tablet Oral Daily  . nicotine  21 mg Transdermal Daily  . predniSONE  50 mg Oral Q breakfast  . sevelamer carbonate  3,200 mg Oral TID WC  . thiamine  100 mg Oral Daily   Or  . thiamine  100 mg Intravenous Daily   Continuous Infusions:   LOS: 1 day    [name], MD [facility] Pager [phone]  If 7PM-7AM, please contact  night-coverage [email or web address] Password [email or web address] 08/18/2016, 9:35 AM

## 2016-08-18 NOTE — Progress Notes (Signed)
At [place] noted pt coughing after eating.  Noted spitting up clear to brown sputum.  Dr. Candiss Norse informed and asked to start pt on flutter valve, put pt on 02 5L.  Bp181/103, P83.  Pt sitting on side of bed.  O2 at 5L 100%.  Will continue to monitor.  Karie Kirks, Therapist, sports.

## 2016-08-18 NOTE — Progress Notes (Signed)
Patient pulled out IV, states "tape was stuck on my shirt and when I pulled it, the whole thing came out". Blood was all over the floor and the bed. Patient got into the shower without calling for help. I walked pass the room and saw the blood everywhere and heard the shower running. Patient said he was fine. Patient also pulled off bandage on AV fistula while in the shower washing up. There was a little blood on the site, but not draining. Helped him get cleaned up (with help). Patient back in bed, not confused, just very non compliant. Call bell within reach and patient relaxing now.

## 2016-08-18 NOTE — Progress Notes (Addendum)
Patient pulled out second IV because "it was itching". Notified on call [facility] about PO ativan order. Patient states he is not confused, he is just ready to go  and he will not be here long because he is leaving.

## 2016-08-18 NOTE — Progress Notes (Signed)
Patient has been constantly taking his telemetry box off and pulling the leads off, refused to wear gown because he wanted to sleep in his own clothes even though there is a pocket for the telemetry box. He says the box is in his way.

## 2016-08-18 NOTE — Progress Notes (Signed)
Notified Dr. Candiss Norse that pt wants to leave AMA and took tele off.  MD instructed to give pt AMA papers if he wants to leave.  Will continue to monitor.  Karie Kirks, Therapist, sports.

## 2016-08-18 NOTE — Progress Notes (Signed)
Pt signed AMA paper and stated have ride that will pick him up  Karie Kirks, RN

## 2016-08-18 NOTE — Progress Notes (Signed)
Informed by Adela Lank in HD pt will be going to HD this afternoon.  Karie Kirks, Therapist, sports.

## 2016-08-18 NOTE — Progress Notes (Signed)
Pt with no IV site.  Pulled out iv last nite.  Notified Dr. Candiss Norse and asked if pt can have his lisinopril 40mg  now.  MD instructed it was ok to do so.  Pt refusing to have his 02 on, which he pulled off. O2 100% via [place].   Will continue to monitor.  Karie Kirks, RN

## 2016-08-18 NOTE — Progress Notes (Signed)
Pt took med this am w/o problem.  Will continue to monitor.  Karie Kirks, Therapist, sports.

## 2016-08-18 NOTE — Progress Notes (Signed)
Pt left AMA of floor at 1142.  Karie Kirks, Therapist, sports.

## 2016-08-19 DIAGNOSIS — Z8659 Personal history of other mental and behavioral disorders: Secondary | ICD-10-CM | POA: Diagnosis not present

## 2016-08-19 DIAGNOSIS — N186 End stage renal disease: Secondary | ICD-10-CM | POA: Diagnosis not present

## 2016-08-19 DIAGNOSIS — Z7951 Long term (current) use of inhaled steroids: Secondary | ICD-10-CM | POA: Diagnosis not present

## 2016-08-19 DIAGNOSIS — J449 Chronic obstructive pulmonary disease, unspecified: Secondary | ICD-10-CM | POA: Diagnosis not present

## 2016-08-19 DIAGNOSIS — I120 Hypertensive chronic kidney disease with stage 5 chronic kidney disease or end stage renal disease: Secondary | ICD-10-CM | POA: Diagnosis not present

## 2016-08-19 DIAGNOSIS — B1920 Unspecified viral hepatitis C without hepatic coma: Secondary | ICD-10-CM | POA: Diagnosis not present

## 2016-08-19 DIAGNOSIS — B1910 Unspecified viral hepatitis B without hepatic coma: Secondary | ICD-10-CM | POA: Diagnosis not present

## 2016-08-19 DIAGNOSIS — M25512 Pain in left shoulder: Secondary | ICD-10-CM | POA: Diagnosis not present

## 2016-08-19 DIAGNOSIS — Z7982 Long term (current) use of aspirin: Secondary | ICD-10-CM | POA: Diagnosis not present

## 2016-08-19 DIAGNOSIS — Z992 Dependence on renal dialysis: Secondary | ICD-10-CM | POA: Diagnosis not present

## 2016-08-20 DIAGNOSIS — N186 End stage renal disease: Secondary | ICD-10-CM | POA: Diagnosis not present

## 2016-08-20 DIAGNOSIS — D509 Iron deficiency anemia, unspecified: Secondary | ICD-10-CM | POA: Diagnosis not present

## 2016-08-20 DIAGNOSIS — D631 Anemia in chronic kidney disease: Secondary | ICD-10-CM | POA: Diagnosis not present

## 2016-08-20 DIAGNOSIS — N2581 Secondary hyperparathyroidism of renal origin: Secondary | ICD-10-CM | POA: Diagnosis not present

## 2016-08-22 LAB — CULTURE, BLOOD (ROUTINE X 2)
Culture: NO GROWTH
Culture: NO GROWTH

## 2016-08-23 DIAGNOSIS — D631 Anemia in chronic kidney disease: Secondary | ICD-10-CM | POA: Diagnosis not present

## 2016-08-23 DIAGNOSIS — N186 End stage renal disease: Secondary | ICD-10-CM | POA: Diagnosis not present

## 2016-08-23 DIAGNOSIS — N2581 Secondary hyperparathyroidism of renal origin: Secondary | ICD-10-CM | POA: Diagnosis not present

## 2016-08-23 DIAGNOSIS — D509 Iron deficiency anemia, unspecified: Secondary | ICD-10-CM | POA: Diagnosis not present

## 2016-08-24 ENCOUNTER — Ambulatory Visit (INDEPENDENT_AMBULATORY_CARE_PROVIDER_SITE_OTHER): Payer: Medicare Other | Admitting: Pharmacist Clinician (PhC)/ Clinical Pharmacy Specialist

## 2016-08-24 DIAGNOSIS — B1920 Unspecified viral hepatitis C without hepatic coma: Secondary | ICD-10-CM | POA: Diagnosis not present

## 2016-08-24 DIAGNOSIS — J449 Chronic obstructive pulmonary disease, unspecified: Secondary | ICD-10-CM | POA: Diagnosis not present

## 2016-08-24 DIAGNOSIS — M25512 Pain in left shoulder: Secondary | ICD-10-CM | POA: Diagnosis not present

## 2016-08-24 DIAGNOSIS — N186 End stage renal disease: Secondary | ICD-10-CM | POA: Diagnosis not present

## 2016-08-24 DIAGNOSIS — I120 Hypertensive chronic kidney disease with stage 5 chronic kidney disease or end stage renal disease: Secondary | ICD-10-CM | POA: Diagnosis not present

## 2016-08-24 DIAGNOSIS — B1910 Unspecified viral hepatitis B without hepatic coma: Secondary | ICD-10-CM | POA: Diagnosis not present

## 2016-08-24 DIAGNOSIS — B182 Chronic viral hepatitis C: Secondary | ICD-10-CM | POA: Diagnosis not present

## 2016-08-24 NOTE — Patient Instructions (Signed)
Continue to take your Mavyret 3 tablets daily with food for 3 months Follow up with me in 2 wks for lab

## 2016-08-24 NOTE — Progress Notes (Signed)
HPI: [name] is a 56 y.o. male who is here for his hep C visit with pharmacy.   Lab Results  Component Value Date   HCVGENOTYPE 1a 07/22/2016    Allergies: Allergies  Allergen Reactions  . Aspirin Other (See Comments)    STOMACH PAIN  . Clonidine Derivatives Itching  . Tramadol Itching    Vitals:    Past Medical History: Past Medical History:  Diagnosis Date  . Adenomatous colon polyp    tubular  . Anemia   . Anxiety   . Atherosclerosis of aorta ([place])   . Cardiomegaly   . Chest pain    DATE UNKNOWN, C/O PERIODICALLY  . Chronic kidney disease    dialysis - M/W/F  . Cocaine abuse   . COPD exacerbation ([name]) 08/17/2016  . Dialysis patient ([place])    Monday-Wednesday-Friday  . GERD (gastroesophageal reflux disease)    DATE UNKNOWN  . Hemorrhoids   . Hepatitis B, chronic ([place])   . Hepatitis C   . Hyperkalemia   . Hypertension   . Metabolic bone disease    Patient denies  . Pneumonia   . Pulmonary edema   . Renal disorder   . Renal insufficiency   . Shortness of breath dyspnea    " for the last past year with this dialysis"  . Tubular adenoma of colon     Social History: Social History   Social History  . Marital status: Single    Spouse name: N/A  . Number of children: 3  . Years of education: 10   Occupational History  . Unemployed    Social History Main Topics  . Smoking status: Current Every Day Smoker    Packs/day: 0.04    Years: 37.00    Types: Cigarettes  . Smokeless tobacco: Never Used     Comment: 2-3 cigarettes per day  . Alcohol use No     Comment: quit  . Drug use: No     Comment: last used cocaine 2 weeks ago 07/22/16  . Sexual activity: Not on file     Comment: "NOT LATELY" on cocaine   Other Topics Concern  . Not on file   Social History Narrative   Lives alone   Caffeine use: Coffee-rare   Soda- daily       Labs: Hep B S Ab (no units)  Date Value  07/11/2011 NEGATIVE   Hepatitis B Surface Ag (no units)  Date  Value  07/22/2016 POSITIVE (A)   HCV Ab (no units)  Date Value  12/23/2010 Reactive (A)    Lab Results  Component Value Date   HCVGENOTYPE 1a 07/22/2016    Hepatitis C RNA quantitative Latest Ref Rng & Units 03/05/2014  HCV Quantitative <15 IU/mL 3,532,992(E)  HCV Quantitative Log <1.18 log 10 6.46(H)    AST (U/L)  Date Value  07/19/2016 51 (H)  07/11/2016 68 (H)  06/24/2016 78 (H)   ALT (U/L)  Date Value  07/22/2016 39  07/19/2016 42  07/11/2016 52  06/24/2016 41   INR (no units)  Date Value  07/23/2016 1.22  07/22/2016 1.2 (H)  02/27/2014 1.01    CrCl: Estimated Creatinine Clearance: 12.9 mL/min (by C-G formula based on SCr of 5.9 mg/dL (H)).  Fibrosis Score: F4 as assessed by fibrosure  Child-Pugh Score: A/B  Previous Treatment Regimen: None  Assessment: [name] started his Mavyret about 2 wks ago. He described how he is taking it correctly during the visit. He was recently admitted  for COPD but left AMA because he didn't like being disturbed with blood draws all the time. I'm going to bring him back every two weeks until he is done. He asked about his elastography. Diane will try to set him up first.   He has not missed a dose so far. Stressed to him how important it's not to miss any doses. He has HD MWF so his appt will need to be made on either Tue/Thurs. Gave him bus passes today.   Recommendations:  Mavyret 3 PO qday x 3 mo F/u with lab in 2 wks  [place], [place].D., BCPS, AAHIVP Clinical Infectious [place] [facility] 08/24/2016, 4:26 PM

## 2016-08-25 DIAGNOSIS — N186 End stage renal disease: Secondary | ICD-10-CM | POA: Diagnosis not present

## 2016-08-25 DIAGNOSIS — D631 Anemia in chronic kidney disease: Secondary | ICD-10-CM | POA: Diagnosis not present

## 2016-08-25 DIAGNOSIS — N2581 Secondary hyperparathyroidism of renal origin: Secondary | ICD-10-CM | POA: Diagnosis not present

## 2016-08-25 DIAGNOSIS — D509 Iron deficiency anemia, unspecified: Secondary | ICD-10-CM | POA: Diagnosis not present

## 2016-08-26 DIAGNOSIS — B1920 Unspecified viral hepatitis C without hepatic coma: Secondary | ICD-10-CM | POA: Diagnosis not present

## 2016-08-26 DIAGNOSIS — B1910 Unspecified viral hepatitis B without hepatic coma: Secondary | ICD-10-CM | POA: Diagnosis not present

## 2016-08-26 DIAGNOSIS — M25512 Pain in left shoulder: Secondary | ICD-10-CM | POA: Diagnosis not present

## 2016-08-26 DIAGNOSIS — N186 End stage renal disease: Secondary | ICD-10-CM | POA: Diagnosis not present

## 2016-08-26 DIAGNOSIS — I120 Hypertensive chronic kidney disease with stage 5 chronic kidney disease or end stage renal disease: Secondary | ICD-10-CM | POA: Diagnosis not present

## 2016-08-26 DIAGNOSIS — J449 Chronic obstructive pulmonary disease, unspecified: Secondary | ICD-10-CM | POA: Diagnosis not present

## 2016-08-27 DIAGNOSIS — D509 Iron deficiency anemia, unspecified: Secondary | ICD-10-CM | POA: Diagnosis not present

## 2016-08-27 DIAGNOSIS — N186 End stage renal disease: Secondary | ICD-10-CM | POA: Diagnosis not present

## 2016-08-27 DIAGNOSIS — N2581 Secondary hyperparathyroidism of renal origin: Secondary | ICD-10-CM | POA: Diagnosis not present

## 2016-08-27 DIAGNOSIS — D631 Anemia in chronic kidney disease: Secondary | ICD-10-CM | POA: Diagnosis not present

## 2016-08-27 MED FILL — MAVYRET 100-40 MG TABS: 100-40 | 28 days supply | Qty: 84 | Fill #1

## 2016-08-29 ENCOUNTER — Emergency Department (HOSPITAL_COMMUNITY): Payer: Medicare Other

## 2016-08-29 ENCOUNTER — Inpatient Hospital Stay (HOSPITAL_COMMUNITY)
Admission: EM | Admit: 2016-08-29 | Discharge: 2016-08-30 | DRG: 189 | Discharge status: Against Medical Advice | Payer: Medicare Other | Attending: Nephrology | Admitting: Nephrology

## 2016-08-29 ENCOUNTER — Encounter (HOSPITAL_COMMUNITY): Payer: Self-pay | Admitting: Emergency Medicine

## 2016-08-29 DIAGNOSIS — J441 Chronic obstructive pulmonary disease with (acute) exacerbation: Secondary | ICD-10-CM | POA: Diagnosis present

## 2016-08-29 DIAGNOSIS — Z825 Family history of asthma and other chronic lower respiratory diseases: Secondary | ICD-10-CM

## 2016-08-29 DIAGNOSIS — Z992 Dependence on renal dialysis: Secondary | ICD-10-CM

## 2016-08-29 DIAGNOSIS — Z7952 Long term (current) use of systemic steroids: Secondary | ICD-10-CM

## 2016-08-29 DIAGNOSIS — Z8249 Family history of ischemic heart disease and other diseases of the circulatory system: Secondary | ICD-10-CM

## 2016-08-29 DIAGNOSIS — R0603 Acute respiratory distress: Secondary | ICD-10-CM

## 2016-08-29 DIAGNOSIS — J9621 Acute and chronic respiratory failure with hypoxia: Secondary | ICD-10-CM | POA: Diagnosis not present

## 2016-08-29 DIAGNOSIS — D696 Thrombocytopenia, unspecified: Secondary | ICD-10-CM | POA: Diagnosis present

## 2016-08-29 DIAGNOSIS — E8770 Fluid overload, unspecified: Secondary | ICD-10-CM | POA: Diagnosis present

## 2016-08-29 DIAGNOSIS — B182 Chronic viral hepatitis C: Secondary | ICD-10-CM | POA: Diagnosis present

## 2016-08-29 DIAGNOSIS — J9601 Acute respiratory failure with hypoxia: Secondary | ICD-10-CM | POA: Diagnosis present

## 2016-08-29 DIAGNOSIS — R0600 Dyspnea, unspecified: Secondary | ICD-10-CM | POA: Diagnosis not present

## 2016-08-29 DIAGNOSIS — R6510 Systemic inflammatory response syndrome (SIRS) of non-infectious origin without acute organ dysfunction: Secondary | ICD-10-CM | POA: Diagnosis present

## 2016-08-29 DIAGNOSIS — Z7982 Long term (current) use of aspirin: Secondary | ICD-10-CM

## 2016-08-29 DIAGNOSIS — I132 Hypertensive heart and chronic kidney disease with heart failure and with stage 5 chronic kidney disease, or end stage renal disease: Secondary | ICD-10-CM | POA: Diagnosis not present

## 2016-08-29 DIAGNOSIS — B181 Chronic viral hepatitis B without delta-agent: Secondary | ICD-10-CM | POA: Diagnosis present

## 2016-08-29 DIAGNOSIS — Z886 Allergy status to analgesic agent status: Secondary | ICD-10-CM

## 2016-08-29 DIAGNOSIS — Z79899 Other long term (current) drug therapy: Secondary | ICD-10-CM

## 2016-08-29 DIAGNOSIS — N186 End stage renal disease: Secondary | ICD-10-CM | POA: Diagnosis not present

## 2016-08-29 DIAGNOSIS — Z9115 Patient's noncompliance with renal dialysis: Secondary | ICD-10-CM

## 2016-08-29 DIAGNOSIS — F17210 Nicotine dependence, cigarettes, uncomplicated: Secondary | ICD-10-CM | POA: Diagnosis present

## 2016-08-29 DIAGNOSIS — Z888 Allergy status to other drugs, medicaments and biological substances status: Secondary | ICD-10-CM

## 2016-08-29 DIAGNOSIS — Z885 Allergy status to narcotic agent status: Secondary | ICD-10-CM

## 2016-08-29 DIAGNOSIS — E875 Hyperkalemia: Secondary | ICD-10-CM | POA: Diagnosis present

## 2016-08-29 DIAGNOSIS — I160 Hypertensive urgency: Secondary | ICD-10-CM | POA: Diagnosis present

## 2016-08-29 DIAGNOSIS — D631 Anemia in chronic kidney disease: Secondary | ICD-10-CM | POA: Diagnosis present

## 2016-08-29 DIAGNOSIS — I5033 Acute on chronic diastolic (congestive) heart failure: Secondary | ICD-10-CM | POA: Diagnosis present

## 2016-08-29 DIAGNOSIS — R0602 Shortness of breath: Secondary | ICD-10-CM | POA: Diagnosis not present

## 2016-08-29 DIAGNOSIS — R069 Unspecified abnormalities of breathing: Secondary | ICD-10-CM | POA: Diagnosis not present

## 2016-08-29 LAB — PROTIME-INR
INR: 1.09
Prothrombin Time: 14.1 seconds (ref 11.4–15.2)

## 2016-08-29 LAB — CBC
HCT: 26.3 % — ABNORMAL LOW (ref 39.0–52.0)
Hemoglobin: 8.9 g/dL — ABNORMAL LOW (ref 13.0–17.0)
MCH: 27.6 pg (ref 26.0–34.0)
MCHC: 33.8 g/dL (ref 30.0–36.0)
MCV: 81.4 fL (ref 78.0–100.0)
Platelets: 61 10*3/uL — ABNORMAL LOW (ref 150–400)
RBC: 3.23 MIL/uL — ABNORMAL LOW (ref 4.22–5.81)
RDW: 19.7 % — ABNORMAL HIGH (ref 11.5–15.5)
WBC: 11.3 10*3/uL — ABNORMAL HIGH (ref 4.0–10.5)

## 2016-08-29 LAB — I-STAT CG4 LACTIC ACID, ED: Lactic Acid, Venous: 2.06 mmol/L (ref 0.5–1.9)

## 2016-08-29 LAB — I-STAT CHEM 8, ED
BUN: 58 mg/dL — ABNORMAL HIGH (ref 6–20)
Calcium, Ion: 1.12 mmol/L — ABNORMAL LOW (ref 1.15–1.40)
Chloride: 93 mmol/L — ABNORMAL LOW (ref 101–111)
Creatinine, Ser: 8.1 mg/dL — ABNORMAL HIGH (ref 0.61–1.24)
Glucose, Bld: 161 mg/dL — ABNORMAL HIGH (ref 65–99)
HCT: 29 % — ABNORMAL LOW (ref 39.0–52.0)
Hemoglobin: 9.9 g/dL — ABNORMAL LOW (ref 13.0–17.0)
Potassium: 5 mmol/L (ref 3.5–5.1)
Sodium: 133 mmol/L — ABNORMAL LOW (ref 135–145)
TCO2: 33 mmol/L (ref 0–100)

## 2016-08-29 LAB — BASIC METABOLIC PANEL
Anion gap: 16 — ABNORMAL HIGH (ref 5–15)
BUN: 49 mg/dL — ABNORMAL HIGH (ref 6–20)
CO2: 26 mmol/L (ref 22–32)
Calcium: 9.7 mg/dL (ref 8.9–10.3)
Chloride: 91 mmol/L — ABNORMAL LOW (ref 101–111)
Creatinine, Ser: 7.8 mg/dL — ABNORMAL HIGH (ref 0.61–1.24)
GFR calc Af Amer: 8 mL/min — ABNORMAL LOW (ref >60)
GFR calc non Af Amer: 7 mL/min — ABNORMAL LOW (ref >60)
Glucose, Bld: 163 mg/dL — ABNORMAL HIGH (ref 65–99)
Potassium: 4.9 mmol/L (ref 3.5–5.1)
Sodium: 133 mmol/L — ABNORMAL LOW (ref 135–145)

## 2016-08-29 LAB — BRAIN NATRIURETIC PEPTIDE: B Natriuretic Peptide: >4500 pg/mL — ABNORMAL HIGH (ref 0.0–100.0)

## 2016-08-29 LAB — I-STAT TROPONIN, ED: Troponin i, poc: 0.05 ng/mL (ref 0.00–0.08)

## 2016-08-29 MED ORDER — NITROGLYCERIN IN D5W 200-5 MCG/ML-% IV SOLN
10.0000 ug/min | INTRAVENOUS | Status: DC
  Administered 2016-08-29: 80 ug/min via INTRAVENOUS
  Filled 2016-08-29: qty 250

## 2016-08-29 MED ORDER — MAGNESIUM SULFATE 2 GM/50ML IV SOLN: 2.0000 g | Freq: Once | INTRAVENOUS | Status: DC

## 2016-08-29 MED ORDER — IPRATROPIUM-ALBUTEROL 0.5-2.5 (3) MG/3ML IN SOLN
RESPIRATORY_TRACT | Status: AC
  Administered 2016-08-29: 6 mL
  Filled 2016-08-29: qty 6

## 2016-08-29 NOTE — ED Triage Notes (Signed)
BIB EMS from home, reports inc SOB over past few days. Pt in resp distress upon arrival, labored breathing. Accessory muscle use. Long MD at bedside to assess. Pt given 125 solumedrol, 2 Mag, 10 albutuerol, 1 atrovent. Hypertensive, 200/100. Tachycardic. Wheezing all lobes.

## 2016-08-29 NOTE — ED Notes (Signed)
Pt appears more comfortable. Pt NSR, no longer ST. No longer using accessory muscle use, no retractions.

## 2016-08-29 NOTE — ED Provider Notes (Addendum)
[facility] DEPT Provider Note   CSN: [id number] Arrival date & time: 08/29/16  2244   By signing my name below, I, Eunice Blase, attest that this documentation has been prepared under the direction and in the presence of Everlene Balls, MD. Electronically signed, Eunice Blase, ED Scribe. 08/29/16. 11:21 PM.   History   Chief Complaint Chief Complaint  Patient presents with  . Respiratory Distress   The history is provided by the patient and medical records. No language interpreter was used.    HPI Comments: [name] is a 56 y.o. male BIB EMS who presents to the Emergency Department complaining of worsened chronic SOB x 2 days. Pt notes associated cough with occasional blood. Triage notes pt had wheezes and labored breathing on arrival. He states he has used albuterol at home without relief. He was given 125 solumedrol, 2 Mag, 10 albuterol and 1 Atrovent by EMS en route, per triage. Pt last had dialysis 08/27/2016. He is a smoker. Pt denies fever and pain. He is not willing to give further history at this time.  Past Medical History:  Diagnosis Date  . Adenomatous colon polyp    tubular  . Anemia   . Anxiety   . Atherosclerosis of aorta ([place])   . Cardiomegaly   . Chest pain    DATE UNKNOWN, C/O PERIODICALLY  . Chronic kidney disease    dialysis - M/W/F  . Cocaine abuse   . COPD exacerbation ([place]) 08/17/2016  . Dialysis patient ([facility] [place])    Monday-Wednesday-Friday  . GERD (gastroesophageal reflux disease)    DATE UNKNOWN  . Hemorrhoids   . Hepatitis B, chronic ([place])   . Hepatitis C   . Hyperkalemia   . Hypertension   . Metabolic bone disease    Patient denies  . Pneumonia   . Pulmonary edema   . Renal disorder   . Renal insufficiency   . Shortness of breath dyspnea    " for the last past year with this dialysis"  . Tubular adenoma of colon     Patient Active Problem List   Diagnosis Date Noted  . Acute respiratory failure with hypoxia ([place]) 08/17/2016  .  COPD exacerbation ([place]) 08/17/2016  . Hypertensive urgency 08/17/2016  . Acute bronchitis 08/17/2016  . Elevated troponin 08/17/2016  . Respiratory failure ([place]) 08/17/2016  . Problem with dialysis access ([place]) 07/23/2016  . End stage renal disease ([place]) 12/02/2015  . Chronic hepatitis B ([place]) 03/05/2014  . Chronic hepatitis C without hepatic coma ([place]) 03/05/2014  . Internal hemorrhoids with bleeding, swelling and itching 03/05/2014  . Thrombocytopenia ([place]) 03/05/2014  . Chest pain 02/27/2014  . Alcohol abuse 04/14/2009  . TOBACCO ABUSE 04/14/2009  . GANGLION CYST 04/14/2009    Past Surgical History:  Procedure Laterality Date  . AV FISTULA PLACEMENT  2012   BELIEVED WAS PLACED IN JUNE  . HEMORRHOID BANDING    . LIGATION OF ARTERIOVENOUS  FISTULA Left 03/16/6386   Procedure: Plication of Left Arm Arteriovenous Fistula;  Surgeon: Elam Dutch, MD;  Location: [facility];  Service: Vascular;  Laterality: Left;  . REVISON OF ARTERIOVENOUS FISTULA Left 5/64/3329   Procedure: PLICATION OF DISTAL ANEURYSMAL SEGEMENT OF LEFT UPPER ARM ARTERIOVENOUS FISTULA;  Surgeon: Elam Dutch, MD;  Location: [facility];  Service: Vascular;  Laterality: Left;  . REVISON OF ARTERIOVENOUS FISTULA Left 11/27/8414   Procedure: Plication of Left Upper Arm Fistula ;  Surgeon: Waynetta Sandy, MD;  Location: [facility];  Service:  Vascular;  Laterality: Left;       Home Medications    Prior to Admission medications   Medication Sig Start Date End Date Taking? Authorizing Provider  albuterol (PROVENTIL HFA;VENTOLIN HFA) 108 (90 Base) MCG/ACT inhaler Inhale 1-2 puffs into the lungs every 6 (six) hours as needed for wheezing or shortness of breath. 07/11/16   Kinnie Feil, PA-C  amLODipine (NORVASC) 10 MG tablet Take 10 mg by mouth at bedtime.     Historical Provider, MD  aspirin EC 81 MG tablet Take 81 mg by mouth daily.    Historical Provider, MD  bisoprolol (ZEBETA) 10 MG tablet Take 10 mg by mouth  daily.    Historical Provider, MD  cinacalcet (SENSIPAR) 30 MG tablet Take 30 mg by mouth daily.    Historical Provider, MD  DULERA 100-5 MCG/ACT AERO Inhale 2 puffs into the lungs 2 (two) times daily as needed for wheezing or shortness of breath.  09/22/15   Historical Provider, MD  Glecaprevir-Pibrentasvir (MAVYRET) 100-40 MG TABS Take 3 tablets by mouth daily. 08/02/16   Thayer Headings, MD  lisinopril (PRINIVIL,ZESTRIL) 20 MG tablet Take 40 mg by mouth daily.    Historical Provider, MD  oxyCODONE-acetaminophen (PERCOCET/ROXICET) 5-325 MG tablet Take 1-2 tablets by mouth every 4 (four) hours as needed for moderate pain. Patient not taking: Reported on 08/24/2016 07/24/16   Alvia Grove, PA-C  polyethylene glycol powder (GLYCOLAX/MIRALAX) powder MIX 17 GM WITH 8 OUNCES OF LIQUID AND TAKE BY MOUTH DAILY 03/22/16   Gatha Mayer, MD  predniSONE (DELTASONE) 50 MG tablet Take 1 tablet daily with breakfast Patient taking differently: Take 50 mg by mouth daily with breakfast.  06/24/16   Elmyra Ricks Pisciotta, PA-C  sevelamer carbonate (RENVELA) 800 MG tablet Take 1,600-3,200 mg by mouth See admin instructions. Take 3200 mg by mouth 3 times daily with meals and take 1600 mg by mouth with snacks.    Historical Provider, MD  traZODone (DESYREL) 50 MG tablet Take 50-100 mg by mouth at bedtime as needed for sleep.    Historical Provider, MD    Family History Family History  Problem Relation Age of Onset  . Heart disease Mother   . Lung cancer Mother   . Heart disease Father   . Malignant hyperthermia Father   . Throat cancer Sister   . Hypertension Other   . COPD Other   . Colon cancer Neg Hx     Social History Social History  Substance Use Topics  . Smoking status: Current Every Day Smoker    Packs/day: 0.04    Years: 37.00    Types: Cigarettes  . Smokeless tobacco: Never Used     Comment: 2-3 cigarettes per day  . Alcohol use No     Comment: quit     Allergies   Aspirin; Clonidine  derivatives; and Tramadol   Review of Systems Review of Systems  All other systems reviewed and are negative.  A complete 10 system review of systems was obtained and all systems are negative except as noted in the HPI and PMH.    Physical Exam Updated Vital Signs BP (!) 232/114 (BP Location: Right Arm)   Pulse (!) 126   Temp 97.5 F (36.4 C) (Axillary)   Resp 23   Ht 5\' 8"  (1.727 m)   Wt 140 lb (63.5 kg)   SpO2 100%   BMI 21.29 kg/m   Physical Exam  Constitutional: He is oriented to person, place, and time. Vital  signs are normal. He appears well-developed and well-nourished.  Non-toxic appearance. He does not appear ill. He appears distressed.  HENT:  Head: Normocephalic and atraumatic.  Nose: Nose normal.  Mouth/Throat: Oropharynx is clear and moist. No oropharyngeal exudate.  Eyes: Conjunctivae and EOM are normal. Pupils are equal, round, and reactive to light. No scleral icterus.  Neck: Normal range of motion. Neck supple. No tracheal deviation, no edema, no erythema and normal range of motion present. No thyroid mass and no thyromegaly present.  Cardiovascular: Regular rhythm, S1 normal, S2 normal, normal heart sounds, intact distal pulses and normal pulses.  Tachycardia present.  Exam reveals no gallop and no friction rub.   No murmur heard. Non-rebreather mask in place  Pulmonary/Chest: Tachypnea noted. He is in respiratory distress. He has decreased breath sounds (bilaterally). He has wheezes. He has no rhonchi. He has no rales.  Increased WOB, increased use of accessory muscles  Abdominal: Soft. Normal appearance and bowel sounds are normal. He exhibits no distension, no ascites and no mass. There is no hepatosplenomegaly. There is no tenderness. There is no rebound, no guarding and no CVA tenderness.  Musculoskeletal: Normal range of motion. He exhibits no edema or tenderness.       Arms: Lymphadenopathy:    He has no cervical adenopathy.  Neurological: He is alert  and oriented to person, place, and time. He has normal strength. No cranial nerve deficit or sensory deficit.  Skin: Skin is warm, dry and intact. No petechiae and no rash noted. He is not diaphoretic. No erythema. No pallor.  Nursing note and vitals reviewed.    ED Treatments / Results  DIAGNOSTIC STUDIES: Oxygen Saturation is 85% on RA, low by my interpretation.    COORDINATION OF CARE: 11:11 PM Discussed treatment plan with pt at bedside and pt agreed to plan.  Labs (all labs ordered are listed, but only abnormal results are displayed) Labs Reviewed  CBC - Abnormal; Notable for the following:       Result Value   WBC 11.3 (*)    RBC 3.23 (*)    Hemoglobin 8.9 (*)    HCT 26.3 (*)    RDW 19.7 (*)    All other components within normal limits  I-STAT CHEM 8, ED - Abnormal; Notable for the following:    Sodium 133 (*)    Chloride 93 (*)    BUN 58 (*)    Creatinine, Ser 8.10 (*)    Glucose, Bld 161 (*)    Calcium, Ion 1.12 (*)    Hemoglobin 9.9 (*)    HCT 29.0 (*)    All other components within normal limits  I-STAT CG4 LACTIC ACID, ED - Abnormal; Notable for the following:    Lactic Acid, Venous 2.06 (*)    All other components within normal limits  BASIC METABOLIC PANEL  BRAIN NATRIURETIC PEPTIDE  PROTIME-INR  I-STAT TROPOININ, ED    EKG  EKG Interpretation  Date/Time:  Sunday August 29 2016 22:51:30 EST Ventricular Rate:  127 PR Interval:    QRS Duration: 179 QT Interval:  384 QTC Calculation: 559 R Axis:   -65 Text Interpretation:  Sinus tachycardia Left bundle branch block Artifact in lead(s) I III aVR aVL No STEMI. Similar to prior.  Confirmed by LONG MD, JOSHUA [phone]) on 08/29/2016 10:55:55 PM       Radiology No results found.  Procedures Procedures (including critical care time)  Medications Ordered in ED Medications  nitroGLYCERIN 50 mg in dextrose 5 %  250 mL (0.2 mg/mL) infusion (not administered)  ipratropium-albuterol (DUONEB) 0.5-2.5 (3)  MG/3ML nebulizer solution (6 mLs  Given 08/29/16 2300)     Initial Impression / Assessment and Plan / ED Course  I have reviewed the triage vital signs and the nursing notes.  Pertinent labs & imaging results that were available during my care of the patient were reviewed by me and considered in my medical decision making (see chart for details).  Patient presents to the ED for severe SOB.  Bedside US reveal B lines, concerning for volume overload given his histoy of ESRD and CHF.  He states he has been compliant with his dialysis sessions, but only takes albuterol and no other medications.  Bipap ordered, labs pending.  Will likely need nephrology consultation tonight for severe SOB and possible emergent dialysis.  11:56 PM Patient now appears much better on bipap, tachypnea improved and O2 is 100%.  He states he feels better as well.  I spoke with Dr. Lorrene Reid who has the patient on the list to be seen in the morning. Plan to admit to step down.  K is 5.0.  Otherwise, nothing acute to intervene on.  [facility] page hospitalist for further care.     CRITICAL CARE Performed by: Everlene Balls, MD   ?  Total critical care time: 50 minutes  Critical care time was exclusive of separately billable procedures and treating other patients.  Critical care was necessary to treat or prevent imminent or life-threatening deterioration.  Critical care was time spent personally by me on the following activities: development of treatment plan with patient and/or surrogate as well as nursing, discussions with consultants, evaluation of patient's response to treatment, examination of patient, obtaining history from patient or surrogate, ordering and performing treatments and interventions, ordering and review of laboratory studies, ordering and review of radiographic studies, pulse oximetry and re-evaluation of patient's condition.      I personally performed the services described in this documentation, which was  scribed in my presence. The recorded information has been reviewed and is accurate.     Final Clinical Impressions(s) / ED Diagnoses   Final diagnoses:  None    New Prescriptions New Prescriptions   No medications on file         Everlene Balls, MD 08/30/16 [id number]

## 2016-08-29 NOTE — ED Notes (Signed)
RT at bedside placing pt on bipap. 

## 2016-08-30 DIAGNOSIS — Z79899 Other long term (current) drug therapy: Secondary | ICD-10-CM | POA: Diagnosis not present

## 2016-08-30 DIAGNOSIS — D696 Thrombocytopenia, unspecified: Secondary | ICD-10-CM | POA: Diagnosis not present

## 2016-08-30 DIAGNOSIS — F17200 Nicotine dependence, unspecified, uncomplicated: Secondary | ICD-10-CM | POA: Diagnosis not present

## 2016-08-30 DIAGNOSIS — Z7982 Long term (current) use of aspirin: Secondary | ICD-10-CM | POA: Diagnosis not present

## 2016-08-30 DIAGNOSIS — I160 Hypertensive urgency: Secondary | ICD-10-CM | POA: Diagnosis not present

## 2016-08-30 DIAGNOSIS — J9601 Acute respiratory failure with hypoxia: Secondary | ICD-10-CM

## 2016-08-30 DIAGNOSIS — F17210 Nicotine dependence, cigarettes, uncomplicated: Secondary | ICD-10-CM | POA: Diagnosis present

## 2016-08-30 DIAGNOSIS — Z825 Family history of asthma and other chronic lower respiratory diseases: Secondary | ICD-10-CM | POA: Diagnosis not present

## 2016-08-30 DIAGNOSIS — R6510 Systemic inflammatory response syndrome (SIRS) of non-infectious origin without acute organ dysfunction: Secondary | ICD-10-CM | POA: Diagnosis present

## 2016-08-30 DIAGNOSIS — B182 Chronic viral hepatitis C: Secondary | ICD-10-CM

## 2016-08-30 DIAGNOSIS — E875 Hyperkalemia: Secondary | ICD-10-CM | POA: Diagnosis present

## 2016-08-30 DIAGNOSIS — R0600 Dyspnea, unspecified: Secondary | ICD-10-CM | POA: Diagnosis not present

## 2016-08-30 DIAGNOSIS — N186 End stage renal disease: Secondary | ICD-10-CM

## 2016-08-30 DIAGNOSIS — J441 Chronic obstructive pulmonary disease with (acute) exacerbation: Secondary | ICD-10-CM

## 2016-08-30 DIAGNOSIS — D638 Anemia in other chronic diseases classified elsewhere: Secondary | ICD-10-CM

## 2016-08-30 DIAGNOSIS — I132 Hypertensive heart and chronic kidney disease with heart failure and with stage 5 chronic kidney disease, or end stage renal disease: Secondary | ICD-10-CM | POA: Diagnosis present

## 2016-08-30 DIAGNOSIS — Z7952 Long term (current) use of systemic steroids: Secondary | ICD-10-CM | POA: Diagnosis not present

## 2016-08-30 DIAGNOSIS — Z9115 Patient's noncompliance with renal dialysis: Secondary | ICD-10-CM | POA: Diagnosis not present

## 2016-08-30 DIAGNOSIS — Z8249 Family history of ischemic heart disease and other diseases of the circulatory system: Secondary | ICD-10-CM | POA: Diagnosis not present

## 2016-08-30 DIAGNOSIS — E8770 Fluid overload, unspecified: Secondary | ICD-10-CM

## 2016-08-30 DIAGNOSIS — B181 Chronic viral hepatitis B without delta-agent: Secondary | ICD-10-CM | POA: Diagnosis present

## 2016-08-30 DIAGNOSIS — J9621 Acute and chronic respiratory failure with hypoxia: Secondary | ICD-10-CM | POA: Diagnosis present

## 2016-08-30 DIAGNOSIS — Z886 Allergy status to analgesic agent status: Secondary | ICD-10-CM | POA: Diagnosis not present

## 2016-08-30 DIAGNOSIS — Z885 Allergy status to narcotic agent status: Secondary | ICD-10-CM | POA: Diagnosis not present

## 2016-08-30 DIAGNOSIS — N2581 Secondary hyperparathyroidism of renal origin: Secondary | ICD-10-CM | POA: Diagnosis not present

## 2016-08-30 DIAGNOSIS — Z888 Allergy status to other drugs, medicaments and biological substances status: Secondary | ICD-10-CM | POA: Diagnosis not present

## 2016-08-30 DIAGNOSIS — D631 Anemia in chronic kidney disease: Secondary | ICD-10-CM | POA: Diagnosis present

## 2016-08-30 DIAGNOSIS — Z992 Dependence on renal dialysis: Secondary | ICD-10-CM | POA: Diagnosis not present

## 2016-08-30 DIAGNOSIS — I120 Hypertensive chronic kidney disease with stage 5 chronic kidney disease or end stage renal disease: Secondary | ICD-10-CM | POA: Diagnosis not present

## 2016-08-30 DIAGNOSIS — I5033 Acute on chronic diastolic (congestive) heart failure: Secondary | ICD-10-CM | POA: Diagnosis present

## 2016-08-30 LAB — PROCALCITONIN: Procalcitonin: 0.56 ng/mL

## 2016-08-30 LAB — CBC
HCT: 25.2 % — ABNORMAL LOW (ref 39.0–52.0)
Hemoglobin: 8.4 g/dL — ABNORMAL LOW (ref 13.0–17.0)
MCH: 27.5 pg (ref 26.0–34.0)
MCHC: 33.3 g/dL (ref 30.0–36.0)
MCV: 82.4 fL (ref 78.0–100.0)
Platelets: 49 10*3/uL — ABNORMAL LOW (ref 150–400)
RBC: 3.06 MIL/uL — ABNORMAL LOW (ref 4.22–5.81)
RDW: 20.1 % — ABNORMAL HIGH (ref 11.5–15.5)
WBC: 9.4 10*3/uL (ref 4.0–10.5)

## 2016-08-30 LAB — BASIC METABOLIC PANEL
Anion gap: 16 — ABNORMAL HIGH (ref 5–15)
BUN: 54 mg/dL — ABNORMAL HIGH (ref 6–20)
CO2: 24 mmol/L (ref 22–32)
Calcium: 9.4 mg/dL (ref 8.9–10.3)
Chloride: 94 mmol/L — ABNORMAL LOW (ref 101–111)
Creatinine, Ser: 6.85 mg/dL — ABNORMAL HIGH (ref 0.61–1.24)
GFR calc Af Amer: 9 mL/min — ABNORMAL LOW (ref >60)
GFR calc non Af Amer: 8 mL/min — ABNORMAL LOW (ref >60)
Glucose, Bld: 142 mg/dL — ABNORMAL HIGH (ref 65–99)
Potassium: 5.6 mmol/L — ABNORMAL HIGH (ref 3.5–5.1)
Sodium: 134 mmol/L — ABNORMAL LOW (ref 135–145)

## 2016-08-30 LAB — MRSA PCR SCREENING: MRSA by PCR: NEGATIVE

## 2016-08-30 LAB — LACTIC ACID, PLASMA: Lactic Acid, Venous: 2.1 mmol/L (ref 0.5–1.9)

## 2016-08-30 MED ORDER — LORAZEPAM 1 MG PO TABS: 1.0000 mg | ORAL_TABLET | Freq: Four times a day (QID) | ORAL | Status: DC | PRN

## 2016-08-30 MED ORDER — ONDANSETRON HCL 4 MG PO TABS: 4.0000 mg | ORAL_TABLET | Freq: Four times a day (QID) | ORAL | Status: DC | PRN

## 2016-08-30 MED ORDER — SEVELAMER CARBONATE 800 MG PO TABS
3200.0000 mg | ORAL_TABLET | Freq: Three times a day (TID) | ORAL | Status: DC
  Administered 2016-08-30: 3200 mg via ORAL
  Filled 2016-08-30: qty 4

## 2016-08-30 MED ORDER — ONDANSETRON HCL 4 MG/2ML IJ SOLN
4.0000 mg | Freq: Four times a day (QID) | INTRAMUSCULAR | Status: DC | PRN
  Administered 2016-08-30: 4 mg via INTRAVENOUS
  Filled 2016-08-30: qty 2

## 2016-08-30 MED ORDER — LORAZEPAM 2 MG/ML IJ SOLN: 1.0000 mg | Freq: Four times a day (QID) | INTRAMUSCULAR | Status: DC | PRN

## 2016-08-30 MED ORDER — CINACALCET HCL 30 MG PO TABS
30.0000 mg | ORAL_TABLET | Freq: Every day | ORAL | Status: DC
  Administered 2016-08-30: 30 mg via ORAL
  Filled 2016-08-30: qty 1

## 2016-08-30 MED ORDER — ARFORMOTEROL TARTRATE 15 MCG/2ML IN NEBU
15.0000 ug | INHALATION_SOLUTION | Freq: Two times a day (BID) | RESPIRATORY_TRACT | Status: DC
  Administered 2016-08-30: 15 ug via RESPIRATORY_TRACT
  Filled 2016-08-30: qty 2

## 2016-08-30 MED ORDER — SODIUM CHLORIDE 0.9 % IV SOLN: 62.5000 mg | INTRAVENOUS | Status: DC

## 2016-08-30 MED ORDER — CALCITRIOL 0.5 MCG PO CAPS
0.7500 ug | ORAL_CAPSULE | ORAL | Status: DC
  Administered 2016-08-30: 0.75 ug via ORAL

## 2016-08-30 MED ORDER — DARBEPOETIN ALFA 150 MCG/0.3ML IJ SOSY
PREFILLED_SYRINGE | INTRAMUSCULAR | Status: AC
  Filled 2016-08-30: qty 0.3

## 2016-08-30 MED ORDER — LORAZEPAM 0.5 MG PO TABS
0.5000 mg | ORAL_TABLET | Freq: Four times a day (QID) | ORAL | Status: DC | PRN
  Administered 2016-08-30: 0.5 mg via ORAL
  Filled 2016-08-30: qty 1

## 2016-08-30 MED ORDER — CALCITRIOL 0.25 MCG PO CAPS
ORAL_CAPSULE | ORAL | Status: AC
  Filled 2016-08-30: qty 3

## 2016-08-30 MED ORDER — TRAZODONE HCL 50 MG PO TABS: 50.0000 mg | ORAL_TABLET | Freq: Every evening | ORAL | Status: DC | PRN

## 2016-08-30 MED ORDER — FOLIC ACID 1 MG PO TABS: 1.0000 mg | ORAL_TABLET | Freq: Every day | ORAL | Status: DC

## 2016-08-30 MED ORDER — CLONAZEPAM 0.5 MG PO TABS: 1.0000 mg | ORAL_TABLET | Freq: Two times a day (BID) | ORAL | Status: DC | PRN

## 2016-08-30 MED ORDER — THIAMINE HCL 100 MG/ML IJ SOLN: 100.0000 mg | Freq: Every day | INTRAMUSCULAR | Status: DC

## 2016-08-30 MED ORDER — ASPIRIN EC 81 MG PO TBEC
81.0000 mg | DELAYED_RELEASE_TABLET | Freq: Every day | ORAL | Status: DC
  Administered 2016-08-30: 81 mg via ORAL
  Filled 2016-08-30: qty 1

## 2016-08-30 MED ORDER — LISINOPRIL 40 MG PO TABS: 40.0000 mg | ORAL_TABLET | Freq: Every day | ORAL | Status: DC

## 2016-08-30 MED ORDER — RENA-VITE PO TABS: 1.0000 | ORAL_TABLET | Freq: Every day | ORAL | Status: DC

## 2016-08-30 MED ORDER — DARBEPOETIN ALFA 150 MCG/0.3ML IJ SOSY
150.0000 ug | PREFILLED_SYRINGE | INTRAMUSCULAR | Status: DC
  Administered 2016-08-30: 150 ug via INTRAVENOUS
  Filled 2016-08-30: qty 0.3

## 2016-08-30 MED ORDER — SEVELAMER CARBONATE 800 MG PO TABS: 1600.0000 mg | ORAL_TABLET | ORAL | Status: DC | PRN

## 2016-08-30 MED ORDER — VITAMIN B-1 100 MG PO TABS: 100.0000 mg | ORAL_TABLET | Freq: Every day | ORAL | Status: DC

## 2016-08-30 MED ORDER — LISINOPRIL 20 MG PO TABS
40.0000 mg | ORAL_TABLET | Freq: Every day | ORAL | Status: DC
  Filled 2016-08-30: qty 2

## 2016-08-30 MED ORDER — METHYLPREDNISOLONE SODIUM SUCC 125 MG IJ SOLR
60.0000 mg | Freq: Three times a day (TID) | INTRAMUSCULAR | Status: DC
  Administered 2016-08-30: 60 mg via INTRAVENOUS
  Filled 2016-08-30: qty 2

## 2016-08-30 MED ORDER — BUDESONIDE 0.5 MG/2ML IN SUSP
0.5000 mg | Freq: Two times a day (BID) | RESPIRATORY_TRACT | Status: DC
  Administered 2016-08-30: 0.5 mg via RESPIRATORY_TRACT
  Filled 2016-08-30: qty 2

## 2016-08-30 MED ORDER — BISOPROLOL FUMARATE 5 MG PO TABS
10.0000 mg | ORAL_TABLET | Freq: Every day | ORAL | Status: DC
  Administered 2016-08-30: 10 mg via ORAL
  Filled 2016-08-30: qty 1

## 2016-08-30 MED ORDER — GLECAPREVIR-PIBRENTASVIR 100-40 MG PO TABS
3.0000 | ORAL_TABLET | Freq: Every day | ORAL | Status: DC
  Administered 2016-08-30: 3 via ORAL

## 2016-08-30 MED ORDER — AMLODIPINE BESYLATE 10 MG PO TABS: 10.0000 mg | ORAL_TABLET | Freq: Every day | ORAL | Status: DC

## 2016-08-30 MED ORDER — LORAZEPAM 0.5 MG PO TABS
ORAL_TABLET | ORAL | Status: AC
Start: 2016-08-30 — End: 2016-08-30
  Filled 2016-08-30: qty 1

## 2016-08-30 MED ORDER — ONDANSETRON HCL 4 MG/2ML IJ SOLN: 4.0000 mg | Freq: Four times a day (QID) | INTRAMUSCULAR | Status: DC | PRN

## 2016-08-30 NOTE — Progress Notes (Signed)
Pharmacy medication history technician has update patient's prior to admission medication list- please review.   Of note, maintenance meds were delivered to patient on 08/27/16 by [facility]- but patient states he has not yet opened the box.   It appears that Dr. Melvern Sample recently prescribed carvedilol (instead of bisoprolol), sertraline, pravastatin and changed the directions of the Renvela. Unable to determine what meds patient had available prior to this delivery.  Please address medication list and make changes as appropriate- would recommend resuming most of home medications if able since patient recently received a delivery.   Thanks, Almeta Monas. Symone Cornman, PharmD, BCPS Clinical Pharmacist 08/30/2016 3:14 PM

## 2016-08-30 NOTE — Progress Notes (Signed)
[facility] Brief Note Full note to follow  Advised by EDP pt in ED with SOB ESRD, PSA, Hep C+, dialysis concompliance. Recent admission earlier in the month for same, signed out AMA  Usual HD = MWF Last tmt 2/16 only 3.5 of 4 hour prescribed time Left with Wt of 62.6 kg  EDP indicates pt currently comfortable, OK for HD in the AM K 5  Dialysis prescription MWF [facility] 4 hours (seldom dialyzes full time) 2K2Ca 400/800 edw 62 kg ? No heparin BC AVF Venofer 50/week Mircera 100 Q2weeks Calcitriol 0.75 TIW  Plan for HD first round  Jamal Maes, MD [place] Pager 08/30/2016, 12:33 AM

## 2016-08-30 NOTE — Consult Note (Signed)
Reason for Consult:Fluid overload Referring Physician: Dr. Lydia Guiles [name] is an 56 y.o. male.  HPI: 56 yr male with hx HTN , ESRD, polysubstance abuse, Hep C,B, thrombocytopenia, COPD, GERD, noted onset of SOB yest pm, worsened so came to ED.  Similar presentation 2 wk ago. Has not stayed on full tx in over 2 wk.  No edema, has nonproductive cough.  No fevers or chills. Getting Hep C tx.  Admits diet issues, but frustrated. Constitutional: as above, weak Eyes: wears glasses,some decrease Ears, nose, mouth, throat, and face: negative Respiratory: as above, 3 cig /d now Cardiovascular: as above, SOB with minimal exertion , no CP Gastrointestinal: negative Genitourinary:negative Integument/breast: negative Hematologic/lymphatic: anemia Musculoskeletal:negative Neurological: negative Allergic/Immunologic: ASA, Clonidine, Tramadol   Dialyzes at [facility] on MWF. Primary Nephrologist [place]. EDW 62 kg. HD Bath 2 Ca, 2 K , Dialyzer 180, Heparin tight to none. Access LUA AVF.  Past Medical History:  Diagnosis Date  . Adenomatous colon polyp    tubular  . Anemia   . Anxiety   . Atherosclerosis of aorta ([place])   . Cardiomegaly   . Chest pain    DATE UNKNOWN, C/O PERIODICALLY  . Chronic kidney disease    dialysis - M/W/F  . Cocaine abuse   . COPD exacerbation ([place]) 08/17/2016  . Dialysis patient ([place])    Monday-Wednesday-Friday  . GERD (gastroesophageal reflux disease)    DATE UNKNOWN  . Hemorrhoids   . Hepatitis B, chronic ([place])   . Hepatitis C   . Hyperkalemia   . Hypertension   . Metabolic bone disease    Patient denies  . Pneumonia   . Pulmonary edema   . Renal disorder   . Renal insufficiency   . Shortness of breath dyspnea    " for the last past year with this dialysis"  . Tubular adenoma of colon     Past Surgical History:  Procedure Laterality Date  . AV FISTULA PLACEMENT  2012   BELIEVED WAS PLACED IN JUNE  . HEMORRHOID BANDING    . LIGATION OF  ARTERIOVENOUS  FISTULA Left 09/13/4560   Procedure: Plication of Left Arm Arteriovenous Fistula;  Surgeon: Elam Dutch, MD;  Location: [place];  Service: Vascular;  Laterality: Left;  . REVISON OF ARTERIOVENOUS FISTULA Left 5/63/8937   Procedure: PLICATION OF DISTAL ANEURYSMAL SEGEMENT OF LEFT UPPER ARM ARTERIOVENOUS FISTULA;  Surgeon: Elam Dutch, MD;  Location: [place];  Service: Vascular;  Laterality: Left;  . REVISON OF ARTERIOVENOUS FISTULA Left 3/42/8768   Procedure: Plication of Left Upper Arm Fistula ;  Surgeon: Waynetta Sandy, MD;  Location: [facility] OR;  Service: Vascular;  Laterality: Left;    Family History  Problem Relation Age of Onset  . Heart disease Mother   . Lung cancer Mother   . Heart disease Father   . Malignant hyperthermia Father   . Throat cancer Sister   . Hypertension Other   . COPD Other   . Colon cancer Neg Hx     Social History:  reports that he has been smoking Cigarettes.  He has a 1.48 pack-year smoking history. He has never used smokeless tobacco. He reports that he does not drink alcohol or use drugs.  Allergies:  Allergies  Allergen Reactions  . Aspirin Other (See Comments)    STOMACH PAIN  . Clonidine Derivatives Itching  . Tramadol Itching    Medications:  I have reviewed the patient's current medications. Prior to Admission:  Prescriptions  Prior to Admission  Medication Sig Dispense Refill Last Dose  . albuterol (PROVENTIL HFA;VENTOLIN HFA) 108 (90 Base) MCG/ACT inhaler Inhale 1-2 puffs into the lungs every 6 (six) hours as needed for wheezing or shortness of breath. 1 Inhaler 0 08/16/2016 at Unknown time  . amLODipine (NORVASC) 10 MG tablet Take 10 mg by mouth at bedtime.    Taking  . aspirin EC 81 MG tablet Take 81 mg by mouth daily.   Taking  . bisoprolol (ZEBETA) 10 MG tablet Take 10 mg by mouth daily.   Taking  . cinacalcet (SENSIPAR) 30 MG tablet Take 30 mg by mouth daily.   Taking  . DULERA 100-5 MCG/ACT AERO Inhale 2 puffs  into the lungs 2 (two) times daily as needed for wheezing or shortness of breath.    Taking  . Glecaprevir-Pibrentasvir (MAVYRET) 100-40 MG TABS Take 3 tablets by mouth daily. 84 tablet 2 Taking  . lisinopril (PRINIVIL,ZESTRIL) 20 MG tablet Take 40 mg by mouth daily.   Taking  . oxyCODONE-acetaminophen (PERCOCET/ROXICET) 5-325 MG tablet Take 1-2 tablets by mouth every 4 (four) hours as needed for moderate pain. (Patient not taking: Reported on 08/24/2016) 6 tablet 0 Not Taking  . polyethylene glycol powder (GLYCOLAX/MIRALAX) powder MIX 17 GM WITH 8 OUNCES OF LIQUID AND TAKE BY MOUTH DAILY 527 g 0 Taking  . predniSONE (DELTASONE) 50 MG tablet Take 1 tablet daily with breakfast (Patient taking differently: Take 50 mg by mouth daily with breakfast. ) 5 tablet 0 Taking  . sevelamer carbonate (RENVELA) 800 MG tablet Take 1,600-3,200 mg by mouth See admin instructions. Take 3200 mg by mouth 3 times daily with meals and take 1600 mg by mouth with snacks.   Taking  . traZODone (DESYREL) 50 MG tablet Take 50-100 mg by mouth at bedtime as needed for sleep.   Taking   Calcitriol .31mg Micera 1037m iv q 2 wk , Venofer 5060mv q wk  Results for orders placed or performed during the hospital encounter of 08/29/16 (from the past 48 hour(s))  Basic metabolic panel     Status: Abnormal   Collection Time: 08/29/16 10:57 PM  Result Value Ref Range   Sodium 133 (L) 135 - 145 mmol/L   Potassium 4.9 3.5 - 5.1 mmol/L   Chloride 91 (L) 101 - 111 mmol/L   CO2 26 22 - 32 mmol/L   Glucose, Bld 163 (H) 65 - 99 mg/dL   BUN 49 (H) 6 - 20 mg/dL   Creatinine, Ser 7.80 (H) 0.61 - 1.24 mg/dL   Calcium 9.7 8.9 - 10.3 mg/dL   GFR calc non Af Amer 7 (L) >60 mL/min   GFR calc Af Amer 8 (L) >60 mL/min    Comment: (NOTE) The eGFR has been calculated using the CKD EPI equation. This calculation has not been validated in all clinical situations. eGFR's persistently <60 mL/min signify possible Chronic Kidney Disease.    Anion  gap 16 (H) 5 - 15  CBC     Status: Abnormal   Collection Time: 08/29/16 10:57 PM  Result Value Ref Range   WBC 11.3 (H) 4.0 - 10.5 K/uL   RBC 3.23 (L) 4.22 - 5.81 MIL/uL   Hemoglobin 8.9 (L) 13.0 - 17.0 g/dL   HCT 26.3 (L) 39.0 - 52.0 %   MCV 81.4 78.0 - 100.0 fL   MCH 27.6 26.0 - 34.0 pg   MCHC 33.8 30.0 - 36.0 g/dL   RDW 19.7 (H) 11.5 - 15.5 %  Platelets 61 (L) 150 - 400 K/uL    Comment: SPECIMEN CHECKED FOR CLOTS REPEATED TO VERIFY PLATELET COUNT CONFIRMED BY SMEAR   Protime-INR     Status: None   Collection Time: 08/29/16 10:58 PM  Result Value Ref Range   Prothrombin Time 14.1 11.4 - 15.2 seconds   INR 1.09   Brain natriuretic peptide     Status: Abnormal   Collection Time: 08/29/16 10:59 PM  Result Value Ref Range   B Natriuretic Peptide >4,500.0 (H) 0.0 - 100.0 pg/mL  I-stat troponin, ED     Status: None   Collection Time: 08/29/16 11:20 PM  Result Value Ref Range   Troponin i, poc 0.05 0.00 - 0.08 ng/mL   Comment 3            Comment: Due to the release kinetics of cTnI, a negative result within the first hours of the onset of symptoms does not rule out myocardial infarction with certainty. If myocardial infarction is still suspected, repeat the test at appropriate intervals.   I-stat chem 8, ed     Status: Abnormal   Collection Time: 08/29/16 11:22 PM  Result Value Ref Range   Sodium 133 (L) 135 - 145 mmol/L   Potassium 5.0 3.5 - 5.1 mmol/L   Chloride 93 (L) 101 - 111 mmol/L   BUN 58 (H) 6 - 20 mg/dL   Creatinine, Ser 8.10 (H) 0.61 - 1.24 mg/dL   Glucose, Bld 161 (H) 65 - 99 mg/dL   Calcium, Ion 1.12 (L) 1.15 - 1.40 mmol/L   TCO2 33 0 - 100 mmol/L   Hemoglobin 9.9 (L) 13.0 - 17.0 g/dL   HCT 29.0 (L) 39.0 - 52.0 %  I-Stat CG4 Lactic Acid, ED     Status: Abnormal   Collection Time: 08/29/16 11:23 PM  Result Value Ref Range   Lactic Acid, Venous 2.06 (HH) 0.5 - 1.9 mmol/L   Comment NOTIFIED PHYSICIAN   Lactic acid, plasma     Status: Abnormal    Collection Time: 08/30/16  2:57 AM  Result Value Ref Range   Lactic Acid, Venous 2.1 (HH) 0.5 - 1.9 mmol/L    Comment: CRITICAL RESULT CALLED TO, READ BACK BY AND VERIFIED WITH: MARTIN S,RN 08/30/16 0346 WAYK   Procalcitonin     Status: None   Collection Time: 08/30/16  3:03 AM  Result Value Ref Range   Procalcitonin 0.56 ng/mL    Comment:        Interpretation: PCT > 0.5 ng/mL and <= 2 ng/mL: Systemic infection (sepsis) is possible, but other conditions are known to elevate PCT as well. (NOTE)         ICU PCT Algorithm               Non ICU PCT Algorithm    ----------------------------     ------------------------------         PCT < 0.25 ng/mL                 PCT < 0.1 ng/mL     Stopping of antibiotics            Stopping of antibiotics       strongly encouraged.               strongly encouraged.    ----------------------------     ------------------------------       PCT level decrease by               PCT <  0.25 ng/mL       >= 80% from peak PCT       OR PCT 0.25 - 0.5 ng/mL          Stopping of antibiotics                                             encouraged.     Stopping of antibiotics           encouraged.    ----------------------------     ------------------------------       PCT level decrease by              PCT >= 0.25 ng/mL       < 80% from peak PCT        AND PCT >= 0.5 ng/mL             Continuing antibiotics                                              encouraged.       Continuing antibiotics            encouraged.    ----------------------------     ------------------------------     PCT level increase compared          PCT > 0.5 ng/mL         with peak PCT AND          PCT >= 0.5 ng/mL             Escalation of antibiotics                                          strongly encouraged.      Escalation of antibiotics        strongly encouraged.   CBC     Status: Abnormal   Collection Time: 08/30/16  3:03 AM  Result Value Ref Range   WBC 9.4 4.0 - 10.5  K/uL   RBC 3.06 (L) 4.22 - 5.81 MIL/uL   Hemoglobin 8.4 (L) 13.0 - 17.0 g/dL   HCT 25.2 (L) 39.0 - 52.0 %   MCV 82.4 78.0 - 100.0 fL   MCH 27.5 26.0 - 34.0 pg   MCHC 33.3 30.0 - 36.0 g/dL   RDW 20.1 (H) 11.5 - 15.5 %   Platelets 49 (L) 150 - 400 K/uL    Comment: CONSISTENT WITH PREVIOUS RESULT  Basic metabolic panel     Status: Abnormal   Collection Time: 08/30/16  3:03 AM  Result Value Ref Range   Sodium 134 (L) 135 - 145 mmol/L   Potassium 5.6 (H) 3.5 - 5.1 mmol/L   Chloride 94 (L) 101 - 111 mmol/L   CO2 24 22 - 32 mmol/L   Glucose, Bld 142 (H) 65 - 99 mg/dL   BUN 54 (H) 6 - 20 mg/dL   Creatinine, Ser 6.85 (H) 0.61 - 1.24 mg/dL   Calcium 9.4 8.9 - 10.3 mg/dL   GFR calc non Af Amer 8 (L) >60 mL/min   GFR calc Af Amer 9 (L) >60 mL/min    Comment: (NOTE) The eGFR  has been calculated using the CKD EPI equation. This calculation has not been validated in all clinical situations. eGFR's persistently <60 mL/min signify possible Chronic Kidney Disease.    Anion gap 16 (H) 5 - 15  MRSA PCR Screening     Status: None   Collection Time: 08/30/16  3:37 AM  Result Value Ref Range   MRSA by PCR NEGATIVE NEGATIVE    Comment:        The GeneXpert MRSA Assay (FDA approved for NASAL specimens only), is one component of a comprehensive MRSA colonization surveillance program. It is not intended to diagnose MRSA infection nor to guide or monitor treatment for MRSA infections.     Dg Chest Port 1 View  Result Date: 08/29/2016 CLINICAL DATA:  Shortness of breath.  End-stage renal disease. EXAM: PORTABLE CHEST 1 VIEW COMPARISON:  08/18/2016. FINDINGS: 2317 hours. The cardio pericardial silhouette is enlarged. Right greater than left diffuse airspace disease again noted. The visualized bony structures of the thorax are intact. Telemetry leads overlie the chest. IMPRESSION: Appearance consistent with pulmonary edema. Electronically Signed   By: Misty Stanley M.D.   On: 08/29/2016 23:47     ROS Blood pressure (!) 160/81, pulse 100, temperature 97.5 F (36.4 C), temperature source Oral, resp. rate 20, height _0  (1.753 m), weight 65.5 kg (144 lb 4.8 oz), SpO2 93 %. Physical Exam Physical Examination: General appearance - thin SOB,  Mental status - aggitated, oriented Eyes - hypertensive retinopathy, conjuntival hemorr Mouth - mucous membranes moist, pharynx normal without lesions Neck - adenopathy noted PCL Lymphatics - posterior cervical nodes Chest - rales noted bibasilar, decreased air entry noted bilat Heart - S1 and S2 normal, systolic murmur GO7/7 at 2nd left intercostal space Abdomen - pos bs, distended, liver down 6 cm,  Musculoskeletal - sopme MCP deformity Extremities - AVF LUA Skin - dry  Assessment/Plan: 1 Fluid overload  Shortened tx, and xs vol, ? Needs lower dry. 2 ESRD: needs to adhere 3 Hypertension: lower vol and meds 4. Anemia of ESRD: esa 5. Metabolic Bone Disease: cont vit D 6 Polysubstance abuse 7 Hep B 8 Hep C on Tx 9 Nonadherence P HD, esa, bp meds,.lower dry.  Seif Teichert L 08/30/2016, 10:49 AM

## 2016-08-30 NOTE — Progress Notes (Signed)
Pt returned from dialysis with tele off, refusing to be hooked up to the monitor. Pt adamant about going home and has agreed to sign AMA papers. Explained to pt importance of finishing dialysis and the fluid overload affects on his breathing. IV removed from pt. MD notified. A/Ox4 and no signs of resp distress at this time. AMA papers in chart. Leanne Chang, RN

## 2016-08-30 NOTE — Progress Notes (Signed)
RT NOTE:  Pt transported on BIPAP to 3W19 without event. Report given to [place], RT.

## 2016-08-30 NOTE — Procedures (Signed)
I was present at this session.  I have reviewed the session itself and made appropriate changes.  HD via LUA AVF.  bp ^ but coming down.  Doubt he will stay on full tx.   [name] L 2/19/20184:15 PM

## 2016-08-30 NOTE — Progress Notes (Signed)
Pt insists on taking Bipap off and getting food, even after being educated on his condition. Respiratory is called and pt is now on O2 Omega 4L. Will continue to monitor.

## 2016-08-30 NOTE — Progress Notes (Signed)
PROGRESS NOTE    [name]  AYT:[id number] DOB: 1960-12-07 DOA: 08/29/2016 PCP: [name], MD   Brief Narrative: 56 y.o. male with medical history significant of HTN, ESRD on HD(MWF), dCHF, anxiety, HCV, HBV, polysubstance abuse, and chronic anemia; who presents with shortness of breath cough and wheezing. Patient required BiPAP in the ER.  Assessment & Plan:  # Acute hypoxic respiratory failure in the setting of acute pulmonary edema/fluid overload: -Required BiPAP on admission. Currently on nasal cannula. -Patient clearly has fluid overload and therefore I do not think this is related with COPD. I will discontinue Solu-Medrol. -Continue bronchodilators as needed - SIRS criteria on admission with no sign of infection. Continue to monitor.  #Possible congestive heart failure, unspecified: -Probably contributed by fluid overload in a patient with renal failure. Patient with BNP of more than 4500 and has acute pulmonary edema associated with hypoxia. -I will check echocardiogram -Hemodialysis treatment to increase ultrafiltration and possibly reduce the dry weight.  #ESRD on hemodialysis: Patient has AV fistula. Unknown if patient is compliant with low-salt diet and hemodialysis treatment. Patient was eating outside burger and soda at bedside. Education provided to the patient. Hemodialysis today by nephrologist. Continue to monitor.  #Hypertensive urgency: This is due to volume mediated. Currently on nitroglycerin drip. Monitor blood pressure. Blood pressure expected to improve after hemodialysis treatment with ultrafiltration. Continue current medications.  #Hyperkalemia: Monitor BMP. Plan for hemodialysis today.  #Anemia of chronic kidney disease: Hemoglobin is stable. Continue to monitor CBC.  #Hepatitis C, chronic: Recommended outpatient follow-up  #Thrombocytopenia: Monitor platelet counts. No sign of bleeding  #History of polysubstance abuse: Patient was rude to  the staffs in the floor. Patient was educated. -Added low dose Ativan oral for his agitation   Principal Problem:   Acute respiratory failure with hypoxia (HCC) Active Problems:   TOBACCO ABUSE   Chronic hepatitis C without hepatic coma (HCC)   Thrombocytopenia (HCC)   End stage renal disease (H[facility])   COPD exacerbation ([facility])   Hypertensive urgency   Fluid overload  DVT prophylaxis: SCD. No anticoagulation because of thrombocytopenia Code Status: Full code Family Communication: No family present at bedside Disposition Plan: Likely discharge home in 2-3 days    Consultants:   Nephrologist  Procedures: Plan for echo Antimicrobials: None  Subjective: Patient was seen and examined at bedside. He reported his shortness of breath is better. No chest pain. No nausea or vomiting. He was eating.  Objective: Vitals:   08/30/16 0530 08/30/16 0622 08/30/16 0742 08/30/16 0800  BP:    (!) 160/81  Pulse: 98 96  100  Resp: 20 20  20   Temp:    97.5 F (36.4 C)  TempSrc:    Oral  SpO2: 98% 95% 94% 93%  Weight:      Height:        Intake/Output Summary (Last 24 hours) at 08/30/16 1125 Last data filed at 08/30/16 0800  Gross per 24 hour  Intake              240 ml  Output               50 ml  Net              190 ml   Filed Weights   08/29/16 2253 08/30/16 0343  Weight: 63.5 kg (140 lb) 65.5 kg (144 lb 4.8 oz)    Examination:  General exam: Patient was rude and angry, no specific reason, otherwise stable Respiratory system: Bibasal  crackles. Respiratory effort normal. No wheezing appreciated Cardiovascular system: S1 & S2 heard, RRR.  No pedal edema. Gastrointestinal system: Abdomen is nondistended, soft and nontender. Normal bowel sounds heard. Central nervous system: Alert and oriented. No focal neurological deficits. Extremities: Symmetric 5 x 5 power. Skin: No rashes, lesions or ulcers Psychiatry: Mild agitation  Data Reviewed: I have personally reviewed following  labs and imaging studies  CBC:  Recent Labs Lab 08/29/16 2257 08/29/16 2322 08/30/16 0303  WBC 11.3*  --  9.4  HGB 8.9* 9.9* 8.4*  HCT 26.3* 29.0* 25.2*  MCV 81.4  --  82.4  PLT 61*  --  49*   Basic Metabolic Panel:  Recent Labs Lab 08/29/16 2257 08/29/16 2322 08/30/16 0303  NA 133* 133* 134*  K 4.9 5.0 5.6*  CL 91* 93* 94*  CO2 26  --  24  GLUCOSE 163* 161* 142*  BUN 49* 58* 54*  CREATININE 7.80* 8.10* 6.85*  CALCIUM 9.7  --  9.4   GFR: Estimated Creatinine Clearance: 11.4 mL/min (by C-G formula based on SCr of 6.85 mg/dL (H)). Liver Function Tests: No results for input(s): AST, ALT, ALKPHOS, BILITOT, PROT, ALBUMIN in the last 168 hours. No results for input(s): LIPASE, AMYLASE in the last 168 hours. No results for input(s): AMMONIA in the last 168 hours. Coagulation Profile:  Recent Labs Lab 08/29/16 2258  INR 1.09   Cardiac Enzymes: No results for input(s): CKTOTAL, CKMB, CKMBINDEX, TROPONINI in the last 168 hours. BNP (last 3 results) No results for input(s): PROBNP in the last 8760 hours. HbA1C: No results for input(s): HGBA1C in the last 72 hours. CBG: No results for input(s): GLUCAP in the last 168 hours. Lipid Profile: No results for input(s): CHOL, HDL, LDLCALC, TRIG, CHOLHDL, LDLDIRECT in the last 72 hours. Thyroid Function Tests: No results for input(s): TSH, T4TOTAL, FREET4, T3FREE, THYROIDAB in the last 72 hours. Anemia Panel: No results for input(s): VITAMINB12, FOLATE, FERRITIN, TIBC, IRON, RETICCTPCT in the last 72 hours. Sepsis Labs:  Recent Labs Lab 08/29/16 2323 08/30/16 0257 08/30/16 0303  PROCALCITON  --   --  0.56  LATICACIDVEN 2.06* 2.1*  --     Recent Results (from the past 240 hour(s))  MRSA PCR Screening     Status: None   Collection Time: 08/30/16  3:37 AM  Result Value Ref Range Status   MRSA by PCR NEGATIVE NEGATIVE Final    Comment:        The GeneXpert MRSA Assay (FDA approved for NASAL specimens only), is one  component of a comprehensive MRSA colonization surveillance program. It is not intended to diagnose MRSA infection nor to guide or monitor treatment for MRSA infections.          Radiology Studies: Dg Chest Port 1 View  Result Date: 08/29/2016 CLINICAL DATA:  Shortness of breath.  End-stage renal disease. EXAM: PORTABLE CHEST 1 VIEW COMPARISON:  08/18/2016. FINDINGS: 2317 hours. The cardio pericardial silhouette is enlarged. Right greater than left diffuse airspace disease again noted. The visualized bony structures of the thorax are intact. Telemetry leads overlie the chest. IMPRESSION: Appearance consistent with pulmonary edema. Electronically Signed   By: Misty Stanley M.D.   On: 08/29/2016 23:47        Scheduled Meds: . amLODipine  10 mg Oral QHS  . arformoterol  15 mcg Nebulization BID  . aspirin EC  81 mg Oral Daily  . bisoprolol  10 mg Oral Daily  . budesonide (PULMICORT) nebulizer solution  0.5  mg Nebulization BID  . calcitRIOL  0.75 mcg Oral Q M,W,F-HD  . cinacalcet  30 mg Oral Q breakfast  . darbepoetin (ARANESP) injection - DIALYSIS  150 mcg Intravenous Q Mon-HD  . [START ON 09/01/2016] ferric gluconate (FERRLECIT/NULECIT) IV  62.5 mg Intravenous Q Wed-HD  . Glecaprevir-Pibrentasvir  3 tablet Oral Daily  . lisinopril  40 mg Oral QHS  . multivitamin  1 tablet Oral QHS  . sevelamer carbonate  3,200 mg Oral TID WC   Continuous Infusions: . nitroGLYCERIN 55 mcg/min (08/30/16 0139)     LOS: 0 days    Dron Tanna Furry, MD [facility] Pager [phone]  If 7PM-7AM, please contact night-coverage [email or web address] Password [facility] 08/30/2016, 11:25 AM

## 2016-08-30 NOTE — Progress Notes (Signed)
RT called to assess patient to see if patient can tolerate being off the BIPAP to use the bathroom. RT assessed patient and placed patient on 4L [place] with extension 02 tubing. Patient's Sp02 is stable at 95%. BIPAP is at patient's bedside. RN is aware. RT will monitor as needed.

## 2016-08-30 NOTE — Progress Notes (Signed)
Patient labs indicate a positive Hepatitis B status.  Returning to room, to be dialyzed this afternoon.

## 2016-08-30 NOTE — H&P (Addendum)
History and Physical    [name] HAL:[id number] DOB: [birth year] DOA: 08/29/2016  Referring MD/NP/PA: Dr. Claudine Mouton PCP: [name], MD  Patient coming from: EMS from home  Chief Complaint: Shortness of breath  HPI: [name] is a 56 y.o. male with medical history significant of HTN, ESRD on HD(MWF), dCHF, anxiety, HCV, HBV, polysubstance abuse, and chronic anemia; who presents with shortness of breath cough and wheezing. History is difficult to obtain due to patient being currently on BiPAP.  It appears patient has been short of breath for the last 2 days. Reports having cough with intermittent hemoptysis. He reports going to hemodialysis regularly with his last session being 2 days ago. Review of records shows that the patient was hospitalized at [facility] from 2/5- 2/7 with similar symptoms, but left AGAINST MEDICAL ADVICE. Discussed that at that time that he was not receiving full dialysis treatment as outpatient.  ED Course: EMS en route had given the patient 125 solumedrol IV , 2 mg  magnesium sulfate, 10 albuterol and 1 Atrovent treatment . Upon admission into the emergency department patient seen to be afebrile, pulse 87-126, respirations up to 24, blood pressure elevated to 232/114, and O2 sat patients maintained and more comfortable after patient placed on BiPAP. Laboratory revealed WBC 13.3, hemoglobin 8.9, platelets 61 BNP greater than 4,500, Nephrology was consulted and Dr. Lorrene Reid who will place patient on the list to have dialysis in the morning.   Review of Systems: As per HPI otherwise 10 point review of systems negative.   Past Medical History:  Diagnosis Date  . Adenomatous colon polyp    tubular  . Anemia   . Anxiety   . Atherosclerosis of aorta ([place])   . Cardiomegaly   . Chest pain    DATE UNKNOWN, C/O PERIODICALLY  . Chronic kidney disease    dialysis - M/W/F  . Cocaine abuse   . COPD exacerbation ([place]) 08/17/2016  . Dialysis patient ([place])    Monday-Wednesday-Friday  . GERD (gastroesophageal reflux disease)    DATE UNKNOWN  . Hemorrhoids   . Hepatitis B, chronic ([place])   . Hepatitis C   . Hyperkalemia   . Hypertension   . Metabolic bone disease    Patient denies  . Pneumonia   . Pulmonary edema   . Renal disorder   . Renal insufficiency   . Shortness of breath dyspnea    " for the last past year with this dialysis"  . Tubular adenoma of colon     Past Surgical History:  Procedure Laterality Date  . AV FISTULA PLACEMENT  2012   BELIEVED WAS PLACED IN JUNE  . HEMORRHOID BANDING    . LIGATION OF ARTERIOVENOUS  FISTULA Left 01/12/5328   Procedure: Plication of Left Arm Arteriovenous Fistula;  Surgeon: Elam Dutch, MD;  Location: [place];  Service: Vascular;  Laterality: Left;  . REVISON OF ARTERIOVENOUS FISTULA Left 04/04/2682   Procedure: PLICATION OF DISTAL ANEURYSMAL SEGEMENT OF LEFT UPPER ARM ARTERIOVENOUS FISTULA;  Surgeon: Elam Dutch, MD;  Location: [place];  Service: Vascular;  Laterality: Left;  . REVISON OF ARTERIOVENOUS FISTULA Left 10/28/6220   Procedure: Plication of Left Upper Arm Fistula ;  Surgeon: Waynetta Sandy, MD;  Location: [facility] OR;  Service: Vascular;  Laterality: Left;     reports that he has been smoking Cigarettes.  He has a 1.48 pack-year smoking history. He has never used smokeless tobacco. He reports that he does not drink alcohol or  use drugs.  Allergies  Allergen Reactions  . Aspirin Other (See Comments)    STOMACH PAIN  . Clonidine Derivatives Itching  . Tramadol Itching    Family History  Problem Relation Age of Onset  . Heart disease Mother   . Lung cancer Mother   . Heart disease Father   . Malignant hyperthermia Father   . Throat cancer Sister   . Hypertension Other   . COPD Other   . Colon cancer Neg Hx     Prior to Admission medications   Medication Sig Start Date End Date Taking? Authorizing Provider  albuterol (PROVENTIL HFA;VENTOLIN HFA) 108 (90 Base)  MCG/ACT inhaler Inhale 1-2 puffs into the lungs every 6 (six) hours as needed for wheezing or shortness of breath. 07/11/16   Kinnie Feil, PA-C  amLODipine (NORVASC) 10 MG tablet Take 10 mg by mouth at bedtime.     Historical Provider, MD  aspirin EC 81 MG tablet Take 81 mg by mouth daily.    Historical Provider, MD  bisoprolol (ZEBETA) 10 MG tablet Take 10 mg by mouth daily.    Historical Provider, MD  cinacalcet (SENSIPAR) 30 MG tablet Take 30 mg by mouth daily.    Historical Provider, MD  DULERA 100-5 MCG/ACT AERO Inhale 2 puffs into the lungs 2 (two) times daily as needed for wheezing or shortness of breath.  09/22/15   Historical Provider, MD  Glecaprevir-Pibrentasvir (MAVYRET) 100-40 MG TABS Take 3 tablets by mouth daily. 08/02/16   Thayer Headings, MD  lisinopril (PRINIVIL,ZESTRIL) 20 MG tablet Take 40 mg by mouth daily.    Historical Provider, MD  oxyCODONE-acetaminophen (PERCOCET/ROXICET) 5-325 MG tablet Take 1-2 tablets by mouth every 4 (four) hours as needed for moderate pain. Patient not taking: Reported on 08/24/2016 07/24/16   Alvia Grove, PA-C  polyethylene glycol powder (GLYCOLAX/MIRALAX) powder MIX 17 GM WITH 8 OUNCES OF LIQUID AND TAKE BY MOUTH DAILY 03/22/16   Gatha Mayer, MD  predniSONE (DELTASONE) 50 MG tablet Take 1 tablet daily with breakfast Patient taking differently: Take 50 mg by mouth daily with breakfast.  06/24/16   Elmyra Ricks Pisciotta, PA-C  sevelamer carbonate (RENVELA) 800 MG tablet Take 1,600-3,200 mg by mouth See admin instructions. Take 3200 mg by mouth 3 times daily with meals and take 1600 mg by mouth with snacks.    Historical Provider, MD  traZODone (DESYREL) 50 MG tablet Take 50-100 mg by mouth at bedtime as needed for sleep.    Historical Provider, MD    Physical Exam:    Constitutional: Older male who appears in some mild distress currently on BiPAP Vitals:   08/29/16 2253 08/29/16 2318 08/29/16 2330 08/30/16 0000  BP:  (!) 199/108 182/85 131/68   Pulse:  109 104   Resp:  22 24 16   Temp:      TempSrc:      SpO2:  100% 100%   Weight: 63.5 kg (140 lb)     Height: 5\' 8"  (1.727 m)      Eyes: PERRL, lids and conjunctivae normal ENMT: Mucous membranes are moist. Posterior pharynx clear of any exudate or lesions.Normal dentition.  Neck: normal, supple, no masses, no thyromegaly Respiratory: Decreased overall aeration with expiratory wheezesand crackles appreciated . Cardiovascular: Regular rate and rhythm, no murmurs / rubs / gallops. No extremity edema. 2+ pedal pulses. No carotid bruits. Fistula present of the left upper extremity. Abdomen: no tenderness, no masses palpated. No hepatosplenomegaly. Bowel sounds positive.  Musculoskeletal: no clubbing /  cyanosis. No joint deformity upper and lower extremities. Good ROM, no contractures. Normal muscle tone.  Skin: no rashes, lesions, ulcers. No induration Neurologic: CN 2-12 grossly intact. Sensation intact, DTR normal. Strength 5/5 in all 4.  Psychiatric: Normal judgment and insight. Alert and oriented x 3. Normal mood.     Labs on Admission: I have personally reviewed following labs and imaging studies  CBC:  Recent Labs Lab 08/29/16 2257 08/29/16 2322  WBC 11.3*  --   HGB 8.9* 9.9*  HCT 26.3* 29.0*  MCV 81.4  --   PLT 61*  --    Basic Metabolic Panel:  Recent Labs Lab 08/29/16 2257 08/29/16 2322  NA 133* 133*  K 4.9 5.0  CL 91* 93*  CO2 26  --   GLUCOSE 163* 161*  BUN 49* 58*  CREATININE 7.80* 8.10*  CALCIUM 9.7  --    GFR: Estimated Creatinine Clearance: 9.4 mL/min (by C-G formula based on SCr of 8.1 mg/dL (H)). Liver Function Tests: No results for input(s): AST, ALT, ALKPHOS, BILITOT, PROT, ALBUMIN in the last 168 hours. No results for input(s): LIPASE, AMYLASE in the last 168 hours. No results for input(s): AMMONIA in the last 168 hours. Coagulation Profile:  Recent Labs Lab 08/29/16 2258  INR 1.09   Cardiac Enzymes: No results for input(s):  CKTOTAL, CKMB, CKMBINDEX, TROPONINI in the last 168 hours. BNP (last 3 results) No results for input(s): PROBNP in the last 8760 hours. HbA1C: No results for input(s): HGBA1C in the last 72 hours. CBG: No results for input(s): GLUCAP in the last 168 hours. Lipid Profile: No results for input(s): CHOL, HDL, LDLCALC, TRIG, CHOLHDL, LDLDIRECT in the last 72 hours. Thyroid Function Tests: No results for input(s): TSH, T4TOTAL, FREET4, T3FREE, THYROIDAB in the last 72 hours. Anemia Panel: No results for input(s): VITAMINB12, FOLATE, FERRITIN, TIBC, IRON, RETICCTPCT in the last 72 hours. Urine analysis:    Component Value Date/Time   COLORURINE YELLOW 07/16/2011 1619   APPEARANCEUR CLEAR 07/16/2011 1619   LABSPEC 1.018 07/16/2011 1619   PHURINE 7.5 07/16/2011 1619   GLUCOSEU 250 (A) 07/16/2011 1619   HGBUR MODERATE (A) 07/16/2011 1619   BILIRUBINUR SMALL (A) 07/16/2011 1619   KETONESUR NEGATIVE 07/16/2011 1619   PROTEINUR >300 (A) 07/16/2011 1619   UROBILINOGEN 1.0 07/16/2011 1619   NITRITE NEGATIVE 07/16/2011 1619   LEUKOCYTESUR SMALL (A) 07/16/2011 1619   Sepsis Labs: No results found for this or any previous visit (from the past 240 hour(s)).   Radiological Exams on Admission: Dg Chest Port 1 View  Result Date: 08/29/2016 CLINICAL DATA:  Shortness of breath.  End-stage renal disease. EXAM: PORTABLE CHEST 1 VIEW COMPARISON:  08/18/2016. FINDINGS: 2317 hours. The cardio pericardial silhouette is enlarged. Right greater than left diffuse airspace disease again noted. The visualized bony structures of the thorax are intact. Telemetry leads overlie the chest. IMPRESSION: Appearance consistent with pulmonary edema. Electronically Signed   By: Misty Stanley M.D.   On: 08/29/2016 23:47    EKG: Independently reviewed. Sinus tachycardia  Assessment/Plan Acute on chronic respiratory failure with hypoxia/COPD exacerbation 2/2 pulmonary edema/fluid overload. During last hospitalization prior  to patient leaving AMA it was thought that patient was not receiving full hemodialysis and therefore likely lead to him being fluid overloaded. - Admit to stepdown unit - Continuous pulse oximetry - Continue BiPAP, wean as tolerated - Duonebs prn SOB/Wheezing - Budesonide and brovana neb  - Solu-Medrol IV  SIRS with elevated Lactic acidosis: Initial lactic acid 2.02  on admission and WBC 11.3. Suspect symptoms most likely related to patient fluid overload status and less likely infectious. - Trend lactic acid level - May want to start on empiric antibiotics   ESRD on HD: Patient has a fistula. Nephrology consulted and will dialyze patient in a.m. - Per nephrology  - continue Renvela, Sensipar  Hypertensive urgency: Acute. Blood pressure is elevated at 232/114 on admission. Patient placed on a nitroglycerin drip. - Continue nitroglycerin drip. - Continue bisprolol, amlodipine, lisinopril  Diastolic congestive heart failure: Acute on chronic. BNP noted to be > 4500. - Check daily weights   Anemia of chronic disease: Hemoglobin 8.9  - Continue to monitor  Thrombocytopenia: Chronic. No signs of bleeding at this time.  - Continue to monitor    Chronic hepatitis C - Continue home meds  Tobacco and alcohol abuse history - Counseled on the cessation of tobacco   DVT prophylaxis: SCDs secondary to thrombocytopenia   Code Status: Full  Family Communication:  No family present at bedside Disposition Plan:  Consults called: Nephrology  Admission status: Inpatient   Norval Morton MD [facility] Pager [phone]  If 7PM-7AM, please contact night-coverage [email or web address] Password [email or web address]  08/30/2016, 12:16 AM

## 2016-08-31 ENCOUNTER — Ambulatory Visit: Payer: Self-pay

## 2016-08-31 DIAGNOSIS — B1920 Unspecified viral hepatitis C without hepatic coma: Secondary | ICD-10-CM | POA: Diagnosis not present

## 2016-08-31 DIAGNOSIS — B1910 Unspecified viral hepatitis B without hepatic coma: Secondary | ICD-10-CM | POA: Diagnosis not present

## 2016-08-31 DIAGNOSIS — M25512 Pain in left shoulder: Secondary | ICD-10-CM | POA: Diagnosis not present

## 2016-08-31 DIAGNOSIS — I120 Hypertensive chronic kidney disease with stage 5 chronic kidney disease or end stage renal disease: Secondary | ICD-10-CM | POA: Diagnosis not present

## 2016-08-31 DIAGNOSIS — N186 End stage renal disease: Secondary | ICD-10-CM | POA: Diagnosis not present

## 2016-08-31 DIAGNOSIS — J449 Chronic obstructive pulmonary disease, unspecified: Secondary | ICD-10-CM | POA: Diagnosis not present

## 2016-08-31 NOTE — Discharge Summary (Signed)
Physician Discharge Summary  [name] IRS:[id number] DOB: [birth year] DOA: 08/29/2016  PCP: [name], MD  Admit date: 08/29/2016 Discharge date: 08/31/2016  Patient LEFT AGAINST MEDICAL ADVICE.   Patient was admitted for hypoxic respiratory failure and possible CHF. Pt is ESRD and received dialysis. I saw patient on 08/30/2016. In the evening, I received a message from the nurse that patient already left hospital against medical advice. Please see my progress note from 08/30/2016 for detail. Unable to do discharge planning for this patient.    Discharge Diagnoses:  Principal Problem:   Acute respiratory failure with hypoxia (HCC) Active Problems:   TOBACCO ABUSE   Chronic hepatitis C without hepatic coma (HCC)   Thrombocytopenia (HCC)   End stage renal disease (HCC)   COPD exacerbation (HCC)   Hypertensive urgency   Fluid overload    Discharge Instructions   Allergies as of 08/30/2016      Reactions   Aspirin Other (See Comments)   STOMACH PAIN   Clonidine Derivatives Itching   Tramadol Itching      Medication List    ASK your doctor about these medications   albuterol 108 (90 Base) MCG/ACT inhaler Commonly known as:  PROVENTIL HFA;VENTOLIN HFA Inhale 1-2 puffs into the lungs every 6 (six) hours as needed for wheezing or shortness of breath.   amLODipine 10 MG tablet Commonly known as:  NORVASC Take 10 mg by mouth at bedtime.   aspirin EC 81 MG tablet Take 81 mg by mouth daily.   carvedilol 25 MG tablet Commonly known as:  COREG Take 25 mg by mouth 2 (two) times daily with a meal.   cinacalcet 30 MG tablet Commonly known as:  SENSIPAR Take 30 mg by mouth daily.   Glecaprevir-Pibrentasvir 100-40 MG Tabs Commonly known as:  MAVYRET Take 3 tablets by mouth daily.   hydrOXYzine 25 MG capsule Commonly known as:  VISTARIL Take 25 mg by mouth at bedtime.   lisinopril 20 MG tablet Commonly known as:  PRINIVIL,ZESTRIL Take 40 mg by mouth daily.    oxyCODONE-acetaminophen 5-325 MG tablet Commonly known as:  PERCOCET/ROXICET Take 1-2 tablets by mouth every 4 (four) hours as needed for moderate pain.   polyethylene glycol powder powder Commonly known as:  GLYCOLAX/MIRALAX MIX 17 GM WITH 8 OUNCES OF LIQUID AND TAKE BY MOUTH DAILY   pravastatin 20 MG tablet Commonly known as:  PRAVACHOL Take 20 mg by mouth daily.   sertraline 25 MG tablet Commonly known as:  ZOLOFT Take 25 mg by mouth daily.   sevelamer carbonate 800 MG tablet Commonly known as:  RENVELA Take 800 mg by mouth 3 (three) times daily with meals.   traZODone 100 MG tablet Commonly known as:  DESYREL Take 100-200 mg by mouth at bedtime as needed for sleep.   traZODone 50 MG tablet Commonly known as:  DESYREL Take 50-100 mg by mouth at bedtime as needed for sleep.       Allergies  Allergen Reactions  . Aspirin Other (See Comments)    STOMACH PAIN  . Clonidine Derivatives Itching  . Tramadol Itching     Discharge Exam: Vitals:   08/30/16 1630 08/30/16 1645  BP: 130/80 (!) 148/90  Pulse: 90 90  Resp: 20 (!) 21  Temp:  98 F (36.7 C)   Vitals:   08/30/16 1530 08/30/16 1600 08/30/16 1630 08/30/16 1645  BP: (!) 154/88 (!) 148/80 130/80 (!) 148/90  Pulse: 94 90 90 90  Resp: 19 (!) 24 20 (!)  21  Temp:    98 F (36.7 C)  TempSrc:    Oral  SpO2:    95%  Weight:    65 kg (143 lb 4.8 oz)  Height:         The results of significant diagnostics from this hospitalization (including imaging, microbiology, ancillary and laboratory) are listed below for reference.     Microbiology: Recent Results (from the past 240 hour(s))  MRSA PCR Screening     Status: None   Collection Time: 08/30/16  3:37 AM  Result Value Ref Range Status   MRSA by PCR NEGATIVE NEGATIVE Final    Comment:        The GeneXpert MRSA Assay (FDA approved for NASAL specimens only), is one component of a comprehensive MRSA colonization surveillance program. It is not intended  to diagnose MRSA infection nor to guide or monitor treatment for MRSA infections.      Labs: BNP (last 3 results)  Recent Labs  08/18/16 0941 08/29/16 2259  BNP >4,500.0* >1,829.9*   Basic Metabolic Panel:  Recent Labs Lab 08/29/16 2257 08/29/16 2322 08/30/16 0303  NA 133* 133* 134*  K 4.9 5.0 5.6*  CL 91* 93* 94*  CO2 26  --  24  GLUCOSE 163* 161* 142*  BUN 49* 58* 54*  CREATININE 7.80* 8.10* 6.85*  CALCIUM 9.7  --  9.4   Liver Function Tests: No results for input(s): AST, ALT, ALKPHOS, BILITOT, PROT, ALBUMIN in the last 168 hours. No results for input(s): LIPASE, AMYLASE in the last 168 hours. No results for input(s): AMMONIA in the last 168 hours. CBC:  Recent Labs Lab 08/29/16 2257 08/29/16 2322 08/30/16 0303  WBC 11.3*  --  9.4  HGB 8.9* 9.9* 8.4*  HCT 26.3* 29.0* 25.2*  MCV 81.4  --  82.4  PLT 61*  --  49*   Cardiac Enzymes: No results for input(s): CKTOTAL, CKMB, CKMBINDEX, TROPONINI in the last 168 hours. BNP: Invalid input(s): POCBNP CBG: No results for input(s): GLUCAP in the last 168 hours. D-Dimer No results for input(s): DDIMER in the last 72 hours. Hgb A1c No results for input(s): HGBA1C in the last 72 hours. Lipid Profile No results for input(s): CHOL, HDL, LDLCALC, TRIG, CHOLHDL, LDLDIRECT in the last 72 hours. Thyroid function studies No results for input(s): TSH, T4TOTAL, T3FREE, THYROIDAB in the last 72 hours.  Invalid input(s): FREET3 Anemia work up No results for input(s): VITAMINB12, FOLATE, FERRITIN, TIBC, IRON, RETICCTPCT in the last 72 hours. Urinalysis    Component Value Date/Time   COLORURINE YELLOW 07/16/2011 1619   APPEARANCEUR CLEAR 07/16/2011 1619   LABSPEC 1.018 07/16/2011 1619   PHURINE 7.5 07/16/2011 1619   GLUCOSEU 250 (A) 07/16/2011 1619   HGBUR MODERATE (A) 07/16/2011 1619   BILIRUBINUR SMALL (A) 07/16/2011 1619   KETONESUR NEGATIVE 07/16/2011 1619   PROTEINUR >300 (A) 07/16/2011 1619   UROBILINOGEN  1.0 07/16/2011 1619   NITRITE NEGATIVE 07/16/2011 1619   LEUKOCYTESUR SMALL (A) 07/16/2011 1619   Sepsis Labs Invalid input(s): PROCALCITONIN,  WBC,  LACTICIDVEN Microbiology Recent Results (from the past 240 hour(s))  MRSA PCR Screening     Status: None   Collection Time: 08/30/16  3:37 AM  Result Value Ref Range Status   MRSA by PCR NEGATIVE NEGATIVE Final    Comment:        The GeneXpert MRSA Assay (FDA approved for NASAL specimens only), is one component of a comprehensive MRSA colonization surveillance program. It is not intended  to diagnose MRSA infection nor to guide or monitor treatment for MRSA infections.        SIGNED:   Rosita Fire, MD  [facility] 08/31/2016, 2:39 PM  If 7PM-7AM, please contact night-coverage [email or web address] Password [email or web address]

## 2016-09-01 DIAGNOSIS — N2581 Secondary hyperparathyroidism of renal origin: Secondary | ICD-10-CM | POA: Diagnosis not present

## 2016-09-01 DIAGNOSIS — D509 Iron deficiency anemia, unspecified: Secondary | ICD-10-CM | POA: Diagnosis not present

## 2016-09-01 DIAGNOSIS — N186 End stage renal disease: Secondary | ICD-10-CM | POA: Diagnosis not present

## 2016-09-01 DIAGNOSIS — D631 Anemia in chronic kidney disease: Secondary | ICD-10-CM | POA: Diagnosis not present

## 2016-09-02 DIAGNOSIS — B182 Chronic viral hepatitis C: Secondary | ICD-10-CM | POA: Diagnosis not present

## 2016-09-02 DIAGNOSIS — D696 Thrombocytopenia, unspecified: Secondary | ICD-10-CM | POA: Diagnosis not present

## 2016-09-02 DIAGNOSIS — D638 Anemia in other chronic diseases classified elsewhere: Secondary | ICD-10-CM | POA: Diagnosis not present

## 2016-09-02 DIAGNOSIS — I10 Essential (primary) hypertension: Secondary | ICD-10-CM | POA: Diagnosis not present

## 2016-09-02 DIAGNOSIS — N186 End stage renal disease: Secondary | ICD-10-CM | POA: Diagnosis not present

## 2016-09-02 DIAGNOSIS — I429 Cardiomyopathy, unspecified: Secondary | ICD-10-CM | POA: Diagnosis not present

## 2016-09-02 DIAGNOSIS — G4700 Insomnia, unspecified: Secondary | ICD-10-CM | POA: Diagnosis not present

## 2016-09-02 DIAGNOSIS — B1920 Unspecified viral hepatitis C without hepatic coma: Secondary | ICD-10-CM | POA: Diagnosis not present

## 2016-09-02 DIAGNOSIS — J449 Chronic obstructive pulmonary disease, unspecified: Secondary | ICD-10-CM | POA: Diagnosis not present

## 2016-09-02 DIAGNOSIS — I120 Hypertensive chronic kidney disease with stage 5 chronic kidney disease or end stage renal disease: Secondary | ICD-10-CM | POA: Diagnosis not present

## 2016-09-02 DIAGNOSIS — Z720 Tobacco use: Secondary | ICD-10-CM | POA: Diagnosis not present

## 2016-09-02 DIAGNOSIS — B1910 Unspecified viral hepatitis B without hepatic coma: Secondary | ICD-10-CM | POA: Diagnosis not present

## 2016-09-02 DIAGNOSIS — M25512 Pain in left shoulder: Secondary | ICD-10-CM | POA: Diagnosis not present

## 2016-09-02 DIAGNOSIS — F419 Anxiety disorder, unspecified: Secondary | ICD-10-CM | POA: Diagnosis not present

## 2016-09-03 DIAGNOSIS — D509 Iron deficiency anemia, unspecified: Secondary | ICD-10-CM | POA: Diagnosis not present

## 2016-09-03 DIAGNOSIS — N2581 Secondary hyperparathyroidism of renal origin: Secondary | ICD-10-CM | POA: Diagnosis not present

## 2016-09-03 DIAGNOSIS — D631 Anemia in chronic kidney disease: Secondary | ICD-10-CM | POA: Diagnosis not present

## 2016-09-03 DIAGNOSIS — N186 End stage renal disease: Secondary | ICD-10-CM | POA: Diagnosis not present

## 2016-09-04 ENCOUNTER — Emergency Department (HOSPITAL_COMMUNITY): Payer: Medicare Other

## 2016-09-04 ENCOUNTER — Encounter (HOSPITAL_COMMUNITY): Payer: Self-pay | Admitting: *Deleted

## 2016-09-04 ENCOUNTER — Emergency Department (HOSPITAL_COMMUNITY)
Admission: EM | Admit: 2016-09-04 | Discharge: 2016-09-04 | Disposition: A | Discharge status: Home / Self Care | Payer: Medicare Other | Attending: Emergency Medicine | Admitting: Emergency Medicine

## 2016-09-04 DIAGNOSIS — Z5321 Procedure and treatment not carried out due to patient leaving prior to being seen by health care provider: Secondary | ICD-10-CM | POA: Diagnosis not present

## 2016-09-04 DIAGNOSIS — R0602 Shortness of breath: Secondary | ICD-10-CM | POA: Diagnosis not present

## 2016-09-04 DIAGNOSIS — I509 Heart failure, unspecified: Secondary | ICD-10-CM | POA: Diagnosis not present

## 2016-09-04 DIAGNOSIS — J90 Pleural effusion, not elsewhere classified: Secondary | ICD-10-CM | POA: Diagnosis not present

## 2016-09-04 LAB — BASIC METABOLIC PANEL
Anion gap: 13 (ref 5–15)
BUN: 41 mg/dL — ABNORMAL HIGH (ref 6–20)
CO2: 30 mmol/L (ref 22–32)
Calcium: 10 mg/dL (ref 8.9–10.3)
Chloride: 92 mmol/L — ABNORMAL LOW (ref 101–111)
Creatinine, Ser: 6.57 mg/dL — ABNORMAL HIGH (ref 0.61–1.24)
GFR calc Af Amer: 10 mL/min — ABNORMAL LOW (ref >60)
GFR calc non Af Amer: 9 mL/min — ABNORMAL LOW (ref >60)
Glucose, Bld: 144 mg/dL — ABNORMAL HIGH (ref 65–99)
Potassium: 4.2 mmol/L (ref 3.5–5.1)
Sodium: 135 mmol/L (ref 135–145)

## 2016-09-04 LAB — I-STAT TROPONIN, ED: Troponin i, poc: 0.08 ng/mL (ref 0.00–0.08)

## 2016-09-04 LAB — CBC
HCT: 27.9 % — ABNORMAL LOW (ref 39.0–52.0)
Hemoglobin: 9.1 g/dL — ABNORMAL LOW (ref 13.0–17.0)
MCH: 27.6 pg (ref 26.0–34.0)
MCHC: 32.6 g/dL (ref 30.0–36.0)
MCV: 84.5 fL (ref 78.0–100.0)
Platelets: 96 10*3/uL — ABNORMAL LOW (ref 150–400)
RBC: 3.3 MIL/uL — ABNORMAL LOW (ref 4.22–5.81)
RDW: 20.5 % — ABNORMAL HIGH (ref 11.5–15.5)
WBC: 8.6 10*3/uL (ref 4.0–10.5)

## 2016-09-04 NOTE — ED Triage Notes (Signed)
Pt to ED by [facility] c/o SOB onset today. Pt is a dialysis pt, last treatment yesterday, pt missed 1.5 hour of treatment. Pt also c/o L arm pain reports seeing his cardiologist and was told the left side of his heart beats stiff and is causing the arm pain.

## 2016-09-04 NOTE — ED Notes (Signed)
Pt did not respond when called for room x 3

## 2016-09-06 DIAGNOSIS — D631 Anemia in chronic kidney disease: Secondary | ICD-10-CM | POA: Diagnosis not present

## 2016-09-06 DIAGNOSIS — N186 End stage renal disease: Secondary | ICD-10-CM | POA: Diagnosis not present

## 2016-09-06 DIAGNOSIS — N2581 Secondary hyperparathyroidism of renal origin: Secondary | ICD-10-CM | POA: Diagnosis not present

## 2016-09-06 DIAGNOSIS — D509 Iron deficiency anemia, unspecified: Secondary | ICD-10-CM | POA: Diagnosis not present

## 2016-09-07 ENCOUNTER — Ambulatory Visit: Payer: Medicare Other

## 2016-09-07 DIAGNOSIS — B1910 Unspecified viral hepatitis B without hepatic coma: Secondary | ICD-10-CM | POA: Diagnosis not present

## 2016-09-07 DIAGNOSIS — B1920 Unspecified viral hepatitis C without hepatic coma: Secondary | ICD-10-CM | POA: Diagnosis not present

## 2016-09-07 DIAGNOSIS — N186 End stage renal disease: Secondary | ICD-10-CM | POA: Diagnosis not present

## 2016-09-07 DIAGNOSIS — I120 Hypertensive chronic kidney disease with stage 5 chronic kidney disease or end stage renal disease: Secondary | ICD-10-CM | POA: Diagnosis not present

## 2016-09-07 DIAGNOSIS — M25512 Pain in left shoulder: Secondary | ICD-10-CM | POA: Diagnosis not present

## 2016-09-07 DIAGNOSIS — J449 Chronic obstructive pulmonary disease, unspecified: Secondary | ICD-10-CM | POA: Diagnosis not present

## 2016-09-08 ENCOUNTER — Ambulatory Visit (HOSPITAL_COMMUNITY): Payer: Medicare Other

## 2016-09-08 DIAGNOSIS — D631 Anemia in chronic kidney disease: Secondary | ICD-10-CM | POA: Diagnosis not present

## 2016-09-08 DIAGNOSIS — Z992 Dependence on renal dialysis: Secondary | ICD-10-CM | POA: Diagnosis not present

## 2016-09-08 DIAGNOSIS — D509 Iron deficiency anemia, unspecified: Secondary | ICD-10-CM | POA: Diagnosis not present

## 2016-09-08 DIAGNOSIS — N2581 Secondary hyperparathyroidism of renal origin: Secondary | ICD-10-CM | POA: Diagnosis not present

## 2016-09-08 DIAGNOSIS — I129 Hypertensive chronic kidney disease with stage 1 through stage 4 chronic kidney disease, or unspecified chronic kidney disease: Secondary | ICD-10-CM | POA: Diagnosis not present

## 2016-09-08 DIAGNOSIS — N186 End stage renal disease: Secondary | ICD-10-CM | POA: Diagnosis not present

## 2016-09-09 ENCOUNTER — Telehealth: Payer: Self-pay | Admitting: Pharmacist Clinician (PhC)/ Clinical Pharmacy Specialist

## 2016-09-09 NOTE — Telephone Encounter (Signed)
Appt has been scheduled for next Tues so we can do labs.

## 2016-09-10 DIAGNOSIS — J449 Chronic obstructive pulmonary disease, unspecified: Secondary | ICD-10-CM | POA: Diagnosis not present

## 2016-09-10 DIAGNOSIS — M25512 Pain in left shoulder: Secondary | ICD-10-CM | POA: Diagnosis not present

## 2016-09-10 DIAGNOSIS — N2581 Secondary hyperparathyroidism of renal origin: Secondary | ICD-10-CM | POA: Diagnosis not present

## 2016-09-10 DIAGNOSIS — D631 Anemia in chronic kidney disease: Secondary | ICD-10-CM | POA: Diagnosis not present

## 2016-09-10 DIAGNOSIS — D509 Iron deficiency anemia, unspecified: Secondary | ICD-10-CM | POA: Diagnosis not present

## 2016-09-10 DIAGNOSIS — I120 Hypertensive chronic kidney disease with stage 5 chronic kidney disease or end stage renal disease: Secondary | ICD-10-CM | POA: Diagnosis not present

## 2016-09-10 DIAGNOSIS — N186 End stage renal disease: Secondary | ICD-10-CM | POA: Diagnosis not present

## 2016-09-10 DIAGNOSIS — B1920 Unspecified viral hepatitis C without hepatic coma: Secondary | ICD-10-CM | POA: Diagnosis not present

## 2016-09-10 DIAGNOSIS — B1910 Unspecified viral hepatitis B without hepatic coma: Secondary | ICD-10-CM | POA: Diagnosis not present

## 2016-09-13 DIAGNOSIS — D631 Anemia in chronic kidney disease: Secondary | ICD-10-CM | POA: Diagnosis not present

## 2016-09-13 DIAGNOSIS — N2581 Secondary hyperparathyroidism of renal origin: Secondary | ICD-10-CM | POA: Diagnosis not present

## 2016-09-13 DIAGNOSIS — D509 Iron deficiency anemia, unspecified: Secondary | ICD-10-CM | POA: Diagnosis not present

## 2016-09-13 DIAGNOSIS — N186 End stage renal disease: Secondary | ICD-10-CM | POA: Diagnosis not present

## 2016-09-14 ENCOUNTER — Ambulatory Visit (INDEPENDENT_AMBULATORY_CARE_PROVIDER_SITE_OTHER): Payer: Medicare Other | Admitting: Pharmacist Clinician (PhC)/ Clinical Pharmacy Specialist

## 2016-09-14 DIAGNOSIS — B182 Chronic viral hepatitis C: Secondary | ICD-10-CM

## 2016-09-14 NOTE — Progress Notes (Signed)
HPI: [name] is a 56 y.o. male who is for his revisit with pharmacy for his hep C.   Lab Results  Component Value Date   HCVGENOTYPE 1a 07/22/2016    Allergies: Allergies  Allergen Reactions  . Aspirin Other (See Comments)    STOMACH PAIN  . Clonidine Derivatives Itching  . Tramadol Itching    Vitals:    Past Medical History: Past Medical History:  Diagnosis Date  . Adenomatous colon polyp    tubular  . Anemia   . Anxiety   . Atherosclerosis of aorta ([place])   . Cardiomegaly   . Chest pain    DATE UNKNOWN, C/O PERIODICALLY  . Chronic kidney disease    dialysis - M/W/F  . Cocaine abuse   . COPD exacerbation ([place]) 08/17/2016  . Dialysis patient ([place])    Monday-Wednesday-Friday  . GERD (gastroesophageal reflux disease)    DATE UNKNOWN  . Hemorrhoids   . Hepatitis B, chronic ([place])   . Hepatitis C   . Hyperkalemia   . Hypertension   . Metabolic bone disease    Patient denies  . Pneumonia   . Pulmonary edema   . Renal disorder   . Renal insufficiency   . Shortness of breath dyspnea    " for the last past year with this dialysis"  . Tubular adenoma of colon     Social History: Social History   Social History  . Marital status: Single    Spouse name: N/A  . Number of children: 3  . Years of education: 10   Occupational History  . Unemployed    Social History Main Topics  . Smoking status: Current Every Day Smoker    Packs/day: 0.04    Years: 37.00    Types: Cigarettes  . Smokeless tobacco: Never Used     Comment: 2-3 cigarettes per day  . Alcohol use No     Comment: quit  . Drug use: No     Comment: last used cocaine 2 weeks ago 07/22/16  . Sexual activity: Not on file     Comment: "NOT LATELY" on cocaine   Other Topics Concern  . Not on file   Social History Narrative   Lives alone   Caffeine use: Coffee-rare   Soda- daily       Labs: Hep B S Ab (no units)  Date Value  07/11/2011 NEGATIVE   Hepatitis B Surface Ag (no units)   Date Value  07/22/2016 POSITIVE (A)   HCV Ab (no units)  Date Value  12/23/2010 Reactive (A)    Lab Results  Component Value Date   HCVGENOTYPE 1a 07/22/2016    Hepatitis C RNA quantitative Latest Ref Rng & Units 03/05/2014  HCV Quantitative <15 IU/mL 6,644,034(V)  HCV Quantitative Log <1.18 log 10 6.46(H)    AST (U/L)  Date Value  07/19/2016 51 (H)  07/11/2016 68 (H)  06/24/2016 78 (H)   ALT (U/L)  Date Value  07/22/2016 39  07/19/2016 42  07/11/2016 52  06/24/2016 41   INR (no units)  Date Value  08/29/2016 1.09  07/23/2016 1.22  07/22/2016 1.2 (H)    CrCl: CrCl cannot be calculated (Unknown ideal weight.).  Fibrosis Score: F4 as assessed by Fibrosure Child-Pugh Score: Class A/B  Previous Treatment Regimen: None  Assessment: Danzig started on Mavyret around 1/31 for his geno 1a hep C. He claimed to have not missed any doses. He hasn't had any side effects. He is into his  2nd month right now. We are going to get labs today to check on his VL status. He is going to f/u with [place] in 1 mo to keep track of him. He knows that he has an [place] appt coming up.   Recommendations:  Cont Mavyret 3 tabs PO qday x 67mo Hep C VL today F/u with pharmacy in 1 mo  [place], [place].D., BCPS, AAHIVP Clinical [facility]ious [facility] 09/14/2016, 2:13 PM

## 2016-09-14 NOTE — Patient Instructions (Signed)
Continue your Mavyret 3 tablets daily with food x 3 month See me back in 1 month

## 2016-09-15 DIAGNOSIS — D631 Anemia in chronic kidney disease: Secondary | ICD-10-CM | POA: Diagnosis not present

## 2016-09-15 DIAGNOSIS — D509 Iron deficiency anemia, unspecified: Secondary | ICD-10-CM | POA: Diagnosis not present

## 2016-09-15 DIAGNOSIS — N186 End stage renal disease: Secondary | ICD-10-CM | POA: Diagnosis not present

## 2016-09-15 DIAGNOSIS — N2581 Secondary hyperparathyroidism of renal origin: Secondary | ICD-10-CM | POA: Diagnosis not present

## 2016-09-16 DIAGNOSIS — B182 Chronic viral hepatitis C: Secondary | ICD-10-CM | POA: Diagnosis not present

## 2016-09-16 DIAGNOSIS — I10 Essential (primary) hypertension: Secondary | ICD-10-CM | POA: Diagnosis not present

## 2016-09-16 DIAGNOSIS — J45909 Unspecified asthma, uncomplicated: Secondary | ICD-10-CM | POA: Diagnosis not present

## 2016-09-16 DIAGNOSIS — F419 Anxiety disorder, unspecified: Secondary | ICD-10-CM | POA: Diagnosis not present

## 2016-09-16 DIAGNOSIS — D696 Thrombocytopenia, unspecified: Secondary | ICD-10-CM | POA: Diagnosis not present

## 2016-09-16 DIAGNOSIS — G4700 Insomnia, unspecified: Secondary | ICD-10-CM | POA: Diagnosis not present

## 2016-09-16 DIAGNOSIS — B1910 Unspecified viral hepatitis B without hepatic coma: Secondary | ICD-10-CM | POA: Diagnosis not present

## 2016-09-16 DIAGNOSIS — I429 Cardiomyopathy, unspecified: Secondary | ICD-10-CM | POA: Diagnosis not present

## 2016-09-16 DIAGNOSIS — N186 End stage renal disease: Secondary | ICD-10-CM | POA: Diagnosis not present

## 2016-09-16 DIAGNOSIS — Z720 Tobacco use: Secondary | ICD-10-CM | POA: Diagnosis not present

## 2016-09-16 DIAGNOSIS — D638 Anemia in other chronic diseases classified elsewhere: Secondary | ICD-10-CM | POA: Diagnosis not present

## 2016-09-16 LAB — HEPATITIS C RNA QUANTITATIVE
HCV Quantitative Log: <1.18 Log IU/mL
HCV Quantitative: <15 IU/mL

## 2016-09-17 DIAGNOSIS — D631 Anemia in chronic kidney disease: Secondary | ICD-10-CM | POA: Diagnosis not present

## 2016-09-17 DIAGNOSIS — N2581 Secondary hyperparathyroidism of renal origin: Secondary | ICD-10-CM | POA: Diagnosis not present

## 2016-09-17 DIAGNOSIS — N186 End stage renal disease: Secondary | ICD-10-CM | POA: Diagnosis not present

## 2016-09-17 DIAGNOSIS — D509 Iron deficiency anemia, unspecified: Secondary | ICD-10-CM | POA: Diagnosis not present

## 2016-09-20 DIAGNOSIS — N2581 Secondary hyperparathyroidism of renal origin: Secondary | ICD-10-CM | POA: Diagnosis not present

## 2016-09-20 DIAGNOSIS — D509 Iron deficiency anemia, unspecified: Secondary | ICD-10-CM | POA: Diagnosis not present

## 2016-09-20 DIAGNOSIS — N186 End stage renal disease: Secondary | ICD-10-CM | POA: Diagnosis not present

## 2016-09-20 DIAGNOSIS — D631 Anemia in chronic kidney disease: Secondary | ICD-10-CM | POA: Diagnosis not present

## 2016-09-21 ENCOUNTER — Ambulatory Visit (HOSPITAL_COMMUNITY): Payer: Medicare Other

## 2016-09-21 DIAGNOSIS — M25512 Pain in left shoulder: Secondary | ICD-10-CM | POA: Diagnosis not present

## 2016-09-21 DIAGNOSIS — J449 Chronic obstructive pulmonary disease, unspecified: Secondary | ICD-10-CM | POA: Diagnosis not present

## 2016-09-21 DIAGNOSIS — B1910 Unspecified viral hepatitis B without hepatic coma: Secondary | ICD-10-CM | POA: Diagnosis not present

## 2016-09-21 DIAGNOSIS — B1920 Unspecified viral hepatitis C without hepatic coma: Secondary | ICD-10-CM | POA: Diagnosis not present

## 2016-09-21 DIAGNOSIS — I120 Hypertensive chronic kidney disease with stage 5 chronic kidney disease or end stage renal disease: Secondary | ICD-10-CM | POA: Diagnosis not present

## 2016-09-21 DIAGNOSIS — N186 End stage renal disease: Secondary | ICD-10-CM | POA: Diagnosis not present

## 2016-09-22 ENCOUNTER — Other Ambulatory Visit: Payer: Self-pay | Admitting: Internal Medicine

## 2016-09-22 DIAGNOSIS — N2581 Secondary hyperparathyroidism of renal origin: Secondary | ICD-10-CM | POA: Diagnosis not present

## 2016-09-22 DIAGNOSIS — D509 Iron deficiency anemia, unspecified: Secondary | ICD-10-CM | POA: Diagnosis not present

## 2016-09-22 DIAGNOSIS — N186 End stage renal disease: Secondary | ICD-10-CM | POA: Diagnosis not present

## 2016-09-22 DIAGNOSIS — D631 Anemia in chronic kidney disease: Secondary | ICD-10-CM | POA: Diagnosis not present

## 2016-09-23 ENCOUNTER — Ambulatory Visit: Payer: Self-pay | Admitting: Physician Assistant

## 2016-09-23 MED FILL — MAVYRET 100-40 MG TABS: 100-40 | 28 days supply | Qty: 84 | Fill #2

## 2016-09-24 DIAGNOSIS — N2581 Secondary hyperparathyroidism of renal origin: Secondary | ICD-10-CM | POA: Diagnosis not present

## 2016-09-24 DIAGNOSIS — D509 Iron deficiency anemia, unspecified: Secondary | ICD-10-CM | POA: Diagnosis not present

## 2016-09-24 DIAGNOSIS — D631 Anemia in chronic kidney disease: Secondary | ICD-10-CM | POA: Diagnosis not present

## 2016-09-24 DIAGNOSIS — N186 End stage renal disease: Secondary | ICD-10-CM | POA: Diagnosis not present

## 2016-09-27 ENCOUNTER — Encounter: Payer: Self-pay | Admitting: Internal Medicine

## 2016-09-27 ENCOUNTER — Ambulatory Visit (INDEPENDENT_AMBULATORY_CARE_PROVIDER_SITE_OTHER): Payer: Medicare Other | Admitting: Internal Medicine

## 2016-09-27 VITALS — BP 98/62 | HR 78 | Ht 69.0 in | Wt 147.0 lb

## 2016-09-27 DIAGNOSIS — F17210 Nicotine dependence, cigarettes, uncomplicated: Secondary | ICD-10-CM

## 2016-09-27 DIAGNOSIS — I10 Essential (primary) hypertension: Secondary | ICD-10-CM | POA: Diagnosis not present

## 2016-09-27 DIAGNOSIS — J441 Chronic obstructive pulmonary disease with (acute) exacerbation: Secondary | ICD-10-CM

## 2016-09-27 DIAGNOSIS — N2581 Secondary hyperparathyroidism of renal origin: Secondary | ICD-10-CM | POA: Diagnosis not present

## 2016-09-27 DIAGNOSIS — D631 Anemia in chronic kidney disease: Secondary | ICD-10-CM | POA: Diagnosis not present

## 2016-09-27 DIAGNOSIS — D509 Iron deficiency anemia, unspecified: Secondary | ICD-10-CM | POA: Diagnosis not present

## 2016-09-27 DIAGNOSIS — F17200 Nicotine dependence, unspecified, uncomplicated: Secondary | ICD-10-CM

## 2016-09-27 DIAGNOSIS — N186 End stage renal disease: Secondary | ICD-10-CM | POA: Diagnosis not present

## 2016-09-27 DIAGNOSIS — J449 Chronic obstructive pulmonary disease, unspecified: Secondary | ICD-10-CM

## 2016-09-27 MED ORDER — BUDESONIDE-FORMOTEROL FUMARATE 160-4.5 MCG/ACT IN AERO: 2.0000 | INHALATION_SPRAY | Freq: Two times a day (BID) | RESPIRATORY_TRACT | 0 refills | Status: DC

## 2016-09-27 MED ORDER — BUDESONIDE-FORMOTEROL FUMARATE 160-4.5 MCG/ACT IN AERO: INHALATION_SPRAY | RESPIRATORY_TRACT | 11 refills | Status: DC

## 2016-09-27 NOTE — Assessment & Plan Note (Signed)
>   3 min discussion I reviewed the Fletcher curve with the patient that basically indicates  if you quit smoking when your best day FEV1 is still well preserved (as is clearly  the case here)  it is highly unlikely you will progress to severe disease and informed the patient there was  no medication on the market that has proven to alter the curve/ its downward trajectory  or the likelihood of progression of their disease(unlike other chronic medical conditions such as atheroclerosis where we do think we can change the natural hx with risk reducing meds)    Therefore stopping smoking and maintaining abstinence is the most important aspect of care, not choice of inhalers or for that matter, doctors.   

## 2016-09-27 NOTE — Assessment & Plan Note (Signed)
In the best review of chronic cough to date ( NEJM 2016 [phone]) ,  ACEi are now felt to cause cough in up to  20% of pts which is a 4 fold increase from previous reports and does not include the variety of non-specific complaints we see in pulmonary clinic in pts on ACEi but previously attributed to another dx like  Copd/asthma and  include PNDS, throat and chest congestion, "bronchitis", unexplained dyspnea and noct "strangling" sensations, and hoarseness, but also  atypical /refractory GERD symptoms like dysphagia and "bad heartburn"   The only way I know  to prove this is not an "ACEi Case" is a trial off ACEi x a minimum of 6 weeks then regroup.  If symptoms continue then this plus trial of more specific BB* need to both be considered before escalation of pulmonary meds  *NB re BB:  Strongly prefer in this setting: Bystolic, the most beta -1  selective Beta blocker available in sample form, with bisoprolol the most selective generic choice  on the market.

## 2016-09-27 NOTE — Patient Instructions (Addendum)
Stop advair - if breathing no better next step is see Dr Vista Lawman for trial off lisinopril  Plan A = Automatic = symbicort 160 Take 2 puffs first thing in am and then another 2 puffs about 12 hours later.    Work on inhaler technique:  relax and gently blow all the way out then take a nice smooth deep breath back in, triggering the inhaler at same time you start breathing in.  Hold for up to 5 seconds if you can. Blow out thru nose. Rinse and gargle with water when done      Plan B = Backup Only use your albuterol (PROVENTIL/yellow) as a rescue medication to be used if you can't catch your breath by resting or doing a relaxed purse lip breathing pattern.  - The less you use it, the better it will work when you need it. - Ok to use the inhaler up to 2 puffs  every 4 hours if you must but call for appointment if use goes up over your usual need - Don't leave home without it !!  (think of it like the spare tire for your car)    The key is to stop smoking completely before smoking completely stops you  - it's not too late!!!   If you are satisfied with your treatment plan,  let your doctor know and he/she can either refill your medications or you can return here when your prescription runs out.     If in any way you are not 100% satisfied,  please tell us.  If 100% better, tell your friends!  Pulmonary follow up is as needed

## 2016-09-27 NOTE — Progress Notes (Signed)
Subjective:     Patient ID: [name], male   DOB: [birth year],    MRN: [id number]  HPI  62 yobm active smoker on HD x 2011 MWF referred to pulmonary clinic 09/27/2016 by Dr   Vista Lawman for sob    09/27/2016 1st [facility] office visit/ [name]   Chief Complaint  Patient presents with  . Pulmonary Consult    Referred by Dr. Vista Lawman for eval of COPD. Pt states that he has been to the ED x 3 for SOB that occurs at night when he lies down. He states this started back in 2017.    HD am of ov and does feel better in terms of breathing p HD but trips do er do not correlate with HD before vs after.  In addition to having paroxyms of resting sob also has doe x MMRC2 = can't walk a nl pace on a flat grade s sob but does fine slow and flat    No obvious patterns in  day to day or daytime variability or assoc excess/ purulent sputum or mucus plugs or hemoptysis or cp or chest tightness, subjective wheeze or overt sinus or hb symptoms. No unusual exp hx or h/o childhood pna/ asthma or knowledge of premature birth.  Sleeping ok most nights  without nocturnal  or early am exacerbation  of respiratory  c/o's or need for noct saba. Also denies any obvious fluctuation of symptoms with weather or environmental changes or other aggravating or alleviating factors except as outlined above   Current Medications, Allergies, Complete Past Medical History, Past Surgical History, Family History, and Social History were reviewed in [employer] record.  ROS  The following are not active complaints unless bolded sore throat, dysphagia, dental problems, itching, sneezing,  nasal congestion or excess/ purulent secretions, ear ache,   fever, chills, sweats, unintended wt loss, classically pleuritic or exertional cp,  orthopnea pnd or leg swelling, presyncope, palpitations, abdominal pain, anorexia, nausea, vomiting, diarrhea  or change in bowel or bladder habits, change in stools or urine,  dysuria,hematuria,  rash, arthralgias, visual complaints, headache, numbness, weakness or ataxia or problems with walking or coordination,  change in mood/affect or memory.           Review of Systems     Objective:   Physical Exam   Disheveled amb thin bm nad with classic pseudowheeze   Wt Readings from Last 3 Encounters:  09/27/16 147 lb (66.7 kg)  08/30/16 143 lb 4.8 oz (65 kg)  08/18/16 140 lb 8 oz (63.7 kg)    Vital signs reviewed - Note on arrival 02 sats  98% on RA     HEENT: nl dentition, turbinates bilaterally, and oropharynx. Nl external ear canals without cough reflex   NECK :  without JVD/Nodes/TM/ nl carotid upstrokes bilaterally   LUNGS: no acc muscle use, slt barrel chest/ mostly pseuowheezing/ transmitted from upper airway, minimal exp wheeze better with PLM   CV:  RRR  no s3 or murmur or increase in P2, and no edema   ABD:  soft and nontender with nl inspiratory excursion in the supine position. No bruits or organomegaly appreciated, bowel sounds nl  MS:  Nl gait/ ext warm without deformities, calf tenderness, cyanosis or clubbing No obvious joint restrictions   SKIN: warm and dry without lesions    NEURO:  alert, approp, nl sensorium with  no motor or cerebellar deficits apparent.      I personally reviewed  images and agree with radiology impression as follows:  CXR:   09/04/16 Cardiomegaly with mild interstitial thickening and small pleural effusions consistent with mild congestive heart failure. Findings are improved when compared to the prior chest radiograph.     Assessment:

## 2016-09-27 NOTE — Assessment & Plan Note (Addendum)
Spirometry 09/27/2016  FEV1 2.20 (70%)  Ratio 64 p am advair - 09/27/2016  After extensive coaching HFA effectiveness =    symbicort 160 2bid   Symptoms are markedly disproportionate to objective findings and not clear this is all a  lung problem but pt does appear to have difficult airway management issues. DDX of  difficult airways management almost all start with A and  include Adherence, Ace Inhibitors, Acid Reflux, Active Sinus Disease, Alpha 1 Antitripsin deficiency, Anxiety masquerading as Airways dz,  ABPA,  Allergy(esp in young), Aspiration (esp in elderly), Adverse effects of meds,  Active smokers, A bunch of PE's (a small clot burden can't cause this syndrome unless there is already severe underlying pulm or vascular dz with poor reserve) plus two Bs  = Bronchiectasis and Beta blocker use..and one C= CHF  Adherence is always the initial "prime suspect" and is a multilayered concern that requires a "trust but verify" approach in every patient - starting with knowing how to use medications, especially inhalers, correctly, keeping up with refills and understanding the fundamental difference between maintenance and prns vs those medications only taken for a very short course and then stopped and not refilled.  - if not better needs to return with all meds in hand using a trust but verify approach to confirm accurate Medication  Reconciliation The principal here is that until we are certain that the  patients are doing what we've asked, it makes no sense to ask them to do more.  - hfa teaching as above  ACEi adverse effects at the  top of the usual list of suspects and the only way to rule it out is a trial off > see a/p    Active smoking also at top (see separate a/p)   ? Adverse effects of dpi mimic acei effects on upper airway > use all haf instead  ? Allergy/ asthma > nothing to suggest this and symb 160 2bid should cover it anyway  ? Anxiety > usually at the bottom of this list of usual  suspects but should be much higher on this pt's based on H and P and note already on psychotropics .   ? BB effects > see hbp   ? chf > doubtful given lack of timing before vs after HD done   Total time devoted to counseling  > 50 % of initial 60 min office visit:  review case with pt/ discussion of options/alternatives/ personally creating written customized instructions  in presence of pt  then going over those specific  Instructions directly with the pt including how to use all of the meds but in particular covering each new medication in detail and the difference between the maintenance= "automatic" meds and the prns using an action plan format for the latter (If this problem/symptom => do that organization reading Left to right).  Please see AVS from this visit for a full list of these instructions which I personally wrote for this pt and  are unique to this visit.

## 2016-09-29 DIAGNOSIS — D509 Iron deficiency anemia, unspecified: Secondary | ICD-10-CM | POA: Diagnosis not present

## 2016-09-29 DIAGNOSIS — N2581 Secondary hyperparathyroidism of renal origin: Secondary | ICD-10-CM | POA: Diagnosis not present

## 2016-09-29 DIAGNOSIS — D631 Anemia in chronic kidney disease: Secondary | ICD-10-CM | POA: Diagnosis not present

## 2016-09-29 DIAGNOSIS — N186 End stage renal disease: Secondary | ICD-10-CM | POA: Diagnosis not present

## 2016-10-01 DIAGNOSIS — D509 Iron deficiency anemia, unspecified: Secondary | ICD-10-CM | POA: Diagnosis not present

## 2016-10-01 DIAGNOSIS — D631 Anemia in chronic kidney disease: Secondary | ICD-10-CM | POA: Diagnosis not present

## 2016-10-01 DIAGNOSIS — N186 End stage renal disease: Secondary | ICD-10-CM | POA: Diagnosis not present

## 2016-10-01 DIAGNOSIS — N2581 Secondary hyperparathyroidism of renal origin: Secondary | ICD-10-CM | POA: Diagnosis not present

## 2016-10-04 DIAGNOSIS — D631 Anemia in chronic kidney disease: Secondary | ICD-10-CM | POA: Diagnosis not present

## 2016-10-04 DIAGNOSIS — N2581 Secondary hyperparathyroidism of renal origin: Secondary | ICD-10-CM | POA: Diagnosis not present

## 2016-10-04 DIAGNOSIS — N186 End stage renal disease: Secondary | ICD-10-CM | POA: Diagnosis not present

## 2016-10-04 DIAGNOSIS — D509 Iron deficiency anemia, unspecified: Secondary | ICD-10-CM | POA: Diagnosis not present

## 2016-10-05 ENCOUNTER — Ambulatory Visit (HOSPITAL_COMMUNITY): Admission: RE | Admit: 2016-10-05 | Payer: Medicare Other | Source: Ambulatory Visit

## 2016-10-05 DIAGNOSIS — N186 End stage renal disease: Secondary | ICD-10-CM | POA: Diagnosis not present

## 2016-10-05 DIAGNOSIS — J449 Chronic obstructive pulmonary disease, unspecified: Secondary | ICD-10-CM | POA: Diagnosis not present

## 2016-10-05 DIAGNOSIS — M25512 Pain in left shoulder: Secondary | ICD-10-CM | POA: Diagnosis not present

## 2016-10-05 DIAGNOSIS — B1910 Unspecified viral hepatitis B without hepatic coma: Secondary | ICD-10-CM | POA: Diagnosis not present

## 2016-10-05 DIAGNOSIS — B1920 Unspecified viral hepatitis C without hepatic coma: Secondary | ICD-10-CM | POA: Diagnosis not present

## 2016-10-05 DIAGNOSIS — I120 Hypertensive chronic kidney disease with stage 5 chronic kidney disease or end stage renal disease: Secondary | ICD-10-CM | POA: Diagnosis not present

## 2016-10-06 DIAGNOSIS — D631 Anemia in chronic kidney disease: Secondary | ICD-10-CM | POA: Diagnosis not present

## 2016-10-06 DIAGNOSIS — N186 End stage renal disease: Secondary | ICD-10-CM | POA: Diagnosis not present

## 2016-10-06 DIAGNOSIS — F910 Conduct disorder confined to family context: Secondary | ICD-10-CM | POA: Diagnosis not present

## 2016-10-06 DIAGNOSIS — D509 Iron deficiency anemia, unspecified: Secondary | ICD-10-CM | POA: Diagnosis not present

## 2016-10-06 DIAGNOSIS — N2581 Secondary hyperparathyroidism of renal origin: Secondary | ICD-10-CM | POA: Diagnosis not present

## 2016-10-08 DIAGNOSIS — N2581 Secondary hyperparathyroidism of renal origin: Secondary | ICD-10-CM | POA: Diagnosis not present

## 2016-10-08 DIAGNOSIS — D509 Iron deficiency anemia, unspecified: Secondary | ICD-10-CM | POA: Diagnosis not present

## 2016-10-08 DIAGNOSIS — D631 Anemia in chronic kidney disease: Secondary | ICD-10-CM | POA: Diagnosis not present

## 2016-10-08 DIAGNOSIS — N186 End stage renal disease: Secondary | ICD-10-CM | POA: Diagnosis not present

## 2016-10-09 DIAGNOSIS — I120 Hypertensive chronic kidney disease with stage 5 chronic kidney disease or end stage renal disease: Secondary | ICD-10-CM | POA: Diagnosis not present

## 2016-10-09 DIAGNOSIS — N186 End stage renal disease: Secondary | ICD-10-CM | POA: Diagnosis not present

## 2016-10-09 DIAGNOSIS — Z992 Dependence on renal dialysis: Secondary | ICD-10-CM | POA: Diagnosis not present

## 2016-10-11 DIAGNOSIS — N186 End stage renal disease: Secondary | ICD-10-CM | POA: Diagnosis not present

## 2016-10-11 DIAGNOSIS — D631 Anemia in chronic kidney disease: Secondary | ICD-10-CM | POA: Diagnosis not present

## 2016-10-11 DIAGNOSIS — D509 Iron deficiency anemia, unspecified: Secondary | ICD-10-CM | POA: Diagnosis not present

## 2016-10-11 DIAGNOSIS — N2581 Secondary hyperparathyroidism of renal origin: Secondary | ICD-10-CM | POA: Diagnosis not present

## 2016-10-13 DIAGNOSIS — N186 End stage renal disease: Secondary | ICD-10-CM | POA: Diagnosis not present

## 2016-10-13 DIAGNOSIS — D509 Iron deficiency anemia, unspecified: Secondary | ICD-10-CM | POA: Diagnosis not present

## 2016-10-13 DIAGNOSIS — N2581 Secondary hyperparathyroidism of renal origin: Secondary | ICD-10-CM | POA: Diagnosis not present

## 2016-10-13 DIAGNOSIS — D631 Anemia in chronic kidney disease: Secondary | ICD-10-CM | POA: Diagnosis not present

## 2016-10-14 DIAGNOSIS — I517 Cardiomegaly: Secondary | ICD-10-CM | POA: Diagnosis not present

## 2016-10-14 DIAGNOSIS — B1910 Unspecified viral hepatitis B without hepatic coma: Secondary | ICD-10-CM | POA: Diagnosis not present

## 2016-10-14 DIAGNOSIS — I10 Essential (primary) hypertension: Secondary | ICD-10-CM | POA: Diagnosis not present

## 2016-10-14 DIAGNOSIS — M25512 Pain in left shoulder: Secondary | ICD-10-CM | POA: Diagnosis not present

## 2016-10-14 DIAGNOSIS — I120 Hypertensive chronic kidney disease with stage 5 chronic kidney disease or end stage renal disease: Secondary | ICD-10-CM | POA: Diagnosis not present

## 2016-10-14 DIAGNOSIS — B1920 Unspecified viral hepatitis C without hepatic coma: Secondary | ICD-10-CM | POA: Diagnosis not present

## 2016-10-14 DIAGNOSIS — N186 End stage renal disease: Secondary | ICD-10-CM | POA: Diagnosis not present

## 2016-10-14 DIAGNOSIS — J449 Chronic obstructive pulmonary disease, unspecified: Secondary | ICD-10-CM | POA: Diagnosis not present

## 2016-10-14 DIAGNOSIS — R0609 Other forms of dyspnea: Secondary | ICD-10-CM | POA: Diagnosis not present

## 2016-10-14 DIAGNOSIS — R9431 Abnormal electrocardiogram [ECG] [EKG]: Secondary | ICD-10-CM | POA: Diagnosis not present

## 2016-10-15 DIAGNOSIS — N2581 Secondary hyperparathyroidism of renal origin: Secondary | ICD-10-CM | POA: Diagnosis not present

## 2016-10-15 DIAGNOSIS — D509 Iron deficiency anemia, unspecified: Secondary | ICD-10-CM | POA: Diagnosis not present

## 2016-10-15 DIAGNOSIS — D631 Anemia in chronic kidney disease: Secondary | ICD-10-CM | POA: Diagnosis not present

## 2016-10-15 DIAGNOSIS — N186 End stage renal disease: Secondary | ICD-10-CM | POA: Diagnosis not present

## 2016-10-18 ENCOUNTER — Ambulatory Visit (INDEPENDENT_AMBULATORY_CARE_PROVIDER_SITE_OTHER): Payer: Medicare Other | Admitting: Pharmacist Clinician (PhC)/ Clinical Pharmacy Specialist

## 2016-10-18 DIAGNOSIS — D509 Iron deficiency anemia, unspecified: Secondary | ICD-10-CM | POA: Diagnosis not present

## 2016-10-18 DIAGNOSIS — N2581 Secondary hyperparathyroidism of renal origin: Secondary | ICD-10-CM | POA: Diagnosis not present

## 2016-10-18 DIAGNOSIS — N186 End stage renal disease: Secondary | ICD-10-CM | POA: Diagnosis not present

## 2016-10-18 DIAGNOSIS — B182 Chronic viral hepatitis C: Secondary | ICD-10-CM

## 2016-10-18 DIAGNOSIS — D631 Anemia in chronic kidney disease: Secondary | ICD-10-CM | POA: Diagnosis not present

## 2016-10-18 NOTE — Progress Notes (Signed)
HPI: [name] is a 56 y.o. 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  Allergies: Allergies  Allergen Reactions  . Aspirin Other (See Comments)    STOMACH PAIN  . Clonidine Derivatives Itching  . Tramadol Itching   Past Medical History: Past Medical History:  Diagnosis Date  . Adenomatous colon polyp    tubular  . Anemia   . Anxiety   . Atherosclerosis of aorta ([place])   . Cardiomegaly   . Chest pain    DATE UNKNOWN, C/O PERIODICALLY  . Chronic kidney disease    dialysis - M/W/F  . Cocaine abuse   . COPD exacerbation ([place]) 08/17/2016  . Dialysis patient ([place])    Monday-Wednesday-Friday  . GERD (gastroesophageal reflux disease)    DATE UNKNOWN  . Hemorrhoids   . Hepatitis B, chronic ([place])   . Hepatitis C   . Hyperkalemia   . Hypertension   . Metabolic bone disease    Patient denies  . Pneumonia   . Pulmonary edema   . Renal disorder   . Renal insufficiency   . Shortness of breath dyspnea    " for the last past year with this dialysis"  . Tubular adenoma of colon     Social History: Social History   Social History  . Marital status: Single    Spouse name: N/A  . Number of children: 3  . Years of education: 10   Occupational History  . Unemployed    Social History Main Topics  . Smoking status: Current Every Day Smoker    Packs/day: 1.00    Years: 43.00    Types: Cigarettes  . Smokeless tobacco: Never Used     Comment: 2-3 cigarettes per day  . Alcohol use No     Comment: quit  . Drug use: No     Comment: last used cocaine 2 weeks ago 07/22/16  . Sexual activity: Not on file     Comment: "NOT LATELY" on cocaine   Other Topics Concern  . Not on file   Social History Narrative   Lives alone   Caffeine use: Coffee-rare   Soda- daily      Current Regimen: Mavyret 3 tablets daily with food   Labs: Hep B S Ab (no units)  Date Value  07/11/2011 NEGATIVE   Hepatitis B  Surface Ag (no units)  Date Value  07/22/2016 POSITIVE (A)   HCV Ab (no units)  Date Value  12/23/2010 Reactive (A)    CrCl: CrCl cannot be calculated (Patient's most recent lab result is older than the maximum 21 days allowed.).  Lipids:    Component Value Date/Time   CHOL 103 08/17/2016 0134   TRIG 97 08/17/2016 0134   HDL 34 (L) 08/17/2016 0134   CHOLHDL 3.0 08/17/2016 0134   VLDL 19 08/17/2016 0134   LDLCALC 50 08/17/2016 0134    Assessment: Humphrey presents one day early after his HD session this morning. He states he was called and told his appointment was today (the reminder call).  He is on his 3rd month of Russell (start date 08/11/16), and denies missing any doses. He does endorse taking his daily doses at various times of the day; however, he does take all doses with food. His HCV VL is currently undetectable (was 1.5 million prior to starting his regimen).  He is tolerating Mavyret well. He does describe non-painful pruritic rash on the left side  of his abdomen. It did not appear overtly red/inflamed. He was instructed to apply Cerna lotion to the area and see if that helps.  He is also co-infected with HBV for which he was originally started on TDF once weekly (since he is ESRD on HD MWF). The TDF was stopped because he took it daily for four days. His HBV VL was undetectable on 1/11. Will reassess at end of therapy visit.   He should come back at the end of the month for his EOT visit and HBV VL. After this, he can return 3 months after his last dose for his SVR12.    Recommendations: Take your Mavyret everyday at the same time with food RTC 5/1 at 2:30 pm for EOT visit  Dierdre Harness, Cain Sieve, PharmD Clinical Pharmacy Resident [phone] (Pager) 10/18/2016 1:39 PM

## 2016-10-18 NOTE — Patient Instructions (Addendum)
Take all 3 tablets of Mavyret at the same time everyday with a meal Do not miss any doses Return Tuesday May 1st at 2:30 pm

## 2016-10-18 NOTE — Progress Notes (Signed)
Agreed with [name]'s note. We will do his EOT VL next month along with his LFTs/hep B VL.

## 2016-10-19 ENCOUNTER — Ambulatory Visit: Payer: Self-pay

## 2016-10-20 DIAGNOSIS — N186 End stage renal disease: Secondary | ICD-10-CM | POA: Diagnosis not present

## 2016-10-20 DIAGNOSIS — D631 Anemia in chronic kidney disease: Secondary | ICD-10-CM | POA: Diagnosis not present

## 2016-10-20 DIAGNOSIS — D509 Iron deficiency anemia, unspecified: Secondary | ICD-10-CM | POA: Diagnosis not present

## 2016-10-20 DIAGNOSIS — N2581 Secondary hyperparathyroidism of renal origin: Secondary | ICD-10-CM | POA: Diagnosis not present

## 2016-10-22 DIAGNOSIS — D509 Iron deficiency anemia, unspecified: Secondary | ICD-10-CM | POA: Diagnosis not present

## 2016-10-22 DIAGNOSIS — D631 Anemia in chronic kidney disease: Secondary | ICD-10-CM | POA: Diagnosis not present

## 2016-10-22 DIAGNOSIS — N2581 Secondary hyperparathyroidism of renal origin: Secondary | ICD-10-CM | POA: Diagnosis not present

## 2016-10-22 DIAGNOSIS — N186 End stage renal disease: Secondary | ICD-10-CM | POA: Diagnosis not present

## 2016-10-25 DIAGNOSIS — N186 End stage renal disease: Secondary | ICD-10-CM | POA: Diagnosis not present

## 2016-10-25 DIAGNOSIS — D509 Iron deficiency anemia, unspecified: Secondary | ICD-10-CM | POA: Diagnosis not present

## 2016-10-25 DIAGNOSIS — N2581 Secondary hyperparathyroidism of renal origin: Secondary | ICD-10-CM | POA: Diagnosis not present

## 2016-10-25 DIAGNOSIS — D631 Anemia in chronic kidney disease: Secondary | ICD-10-CM | POA: Diagnosis not present

## 2016-10-26 DIAGNOSIS — I10 Essential (primary) hypertension: Secondary | ICD-10-CM | POA: Diagnosis not present

## 2016-10-26 DIAGNOSIS — D638 Anemia in other chronic diseases classified elsewhere: Secondary | ICD-10-CM | POA: Diagnosis not present

## 2016-10-26 DIAGNOSIS — J45909 Unspecified asthma, uncomplicated: Secondary | ICD-10-CM | POA: Diagnosis not present

## 2016-10-26 DIAGNOSIS — G4700 Insomnia, unspecified: Secondary | ICD-10-CM | POA: Diagnosis not present

## 2016-10-26 DIAGNOSIS — N186 End stage renal disease: Secondary | ICD-10-CM | POA: Diagnosis not present

## 2016-10-26 DIAGNOSIS — F419 Anxiety disorder, unspecified: Secondary | ICD-10-CM | POA: Diagnosis not present

## 2016-10-26 DIAGNOSIS — B1910 Unspecified viral hepatitis B without hepatic coma: Secondary | ICD-10-CM | POA: Diagnosis not present

## 2016-10-26 DIAGNOSIS — B182 Chronic viral hepatitis C: Secondary | ICD-10-CM | POA: Diagnosis not present

## 2016-10-26 DIAGNOSIS — I429 Cardiomyopathy, unspecified: Secondary | ICD-10-CM | POA: Diagnosis not present

## 2016-10-26 DIAGNOSIS — Z720 Tobacco use: Secondary | ICD-10-CM | POA: Diagnosis not present

## 2016-10-26 DIAGNOSIS — D696 Thrombocytopenia, unspecified: Secondary | ICD-10-CM | POA: Diagnosis not present

## 2016-10-27 DIAGNOSIS — D509 Iron deficiency anemia, unspecified: Secondary | ICD-10-CM | POA: Diagnosis not present

## 2016-10-27 DIAGNOSIS — D631 Anemia in chronic kidney disease: Secondary | ICD-10-CM | POA: Diagnosis not present

## 2016-10-27 DIAGNOSIS — N186 End stage renal disease: Secondary | ICD-10-CM | POA: Diagnosis not present

## 2016-10-27 DIAGNOSIS — N2581 Secondary hyperparathyroidism of renal origin: Secondary | ICD-10-CM | POA: Diagnosis not present

## 2016-10-28 DIAGNOSIS — R0989 Other specified symptoms and signs involving the circulatory and respiratory systems: Secondary | ICD-10-CM | POA: Diagnosis not present

## 2016-10-29 DIAGNOSIS — N2581 Secondary hyperparathyroidism of renal origin: Secondary | ICD-10-CM | POA: Diagnosis not present

## 2016-10-29 DIAGNOSIS — D509 Iron deficiency anemia, unspecified: Secondary | ICD-10-CM | POA: Diagnosis not present

## 2016-10-29 DIAGNOSIS — D631 Anemia in chronic kidney disease: Secondary | ICD-10-CM | POA: Diagnosis not present

## 2016-10-29 DIAGNOSIS — N186 End stage renal disease: Secondary | ICD-10-CM | POA: Diagnosis not present

## 2016-11-01 DIAGNOSIS — D509 Iron deficiency anemia, unspecified: Secondary | ICD-10-CM | POA: Diagnosis not present

## 2016-11-01 DIAGNOSIS — D631 Anemia in chronic kidney disease: Secondary | ICD-10-CM | POA: Diagnosis not present

## 2016-11-01 DIAGNOSIS — N2581 Secondary hyperparathyroidism of renal origin: Secondary | ICD-10-CM | POA: Diagnosis not present

## 2016-11-01 DIAGNOSIS — N186 End stage renal disease: Secondary | ICD-10-CM | POA: Diagnosis not present

## 2016-11-02 ENCOUNTER — Ambulatory Visit (INDEPENDENT_AMBULATORY_CARE_PROVIDER_SITE_OTHER): Payer: Medicare Other | Admitting: Internal Medicine

## 2016-11-02 ENCOUNTER — Encounter: Payer: Self-pay | Admitting: Internal Medicine

## 2016-11-02 VITALS — BP 124/70 | HR 61 | Ht 69.0 in | Wt 155.2 lb

## 2016-11-02 DIAGNOSIS — Z86010 Personal history of colonic polyps: Secondary | ICD-10-CM | POA: Diagnosis not present

## 2016-11-02 DIAGNOSIS — B1910 Unspecified viral hepatitis B without hepatic coma: Secondary | ICD-10-CM | POA: Diagnosis not present

## 2016-11-02 DIAGNOSIS — K648 Other hemorrhoids: Secondary | ICD-10-CM

## 2016-11-02 DIAGNOSIS — N186 End stage renal disease: Secondary | ICD-10-CM | POA: Diagnosis not present

## 2016-11-02 DIAGNOSIS — B1920 Unspecified viral hepatitis C without hepatic coma: Secondary | ICD-10-CM

## 2016-11-02 DIAGNOSIS — Z992 Dependence on renal dialysis: Secondary | ICD-10-CM

## 2016-11-02 NOTE — Patient Instructions (Addendum)
You are scheduled for your 1st hemorrhoidal banding on Monday, May 7 @ 3:45 pm.  You are scheduled for your 2nd hemorrhoidal banding on Wednesday June 13 @ 3:45 pm.  Continue Miralax.  You have been scheduled for an abdominal ultrasound with elastography at [facility] : [facility] (1st floor). Your appointment is scheduled for Tuesday, 11/09/16 at 11:00 am. Please arrive 15 minutes prior to your scheduled appointment for registration purposes. Make certain not to have anything to eat or drink 4 hours prior to your procedure. Should you need to reschedule your appointment, you may contact radiology at [phone].  Liver Elastography Various chronic liver diseases such as hepatitis B, C, and fatty liver disease can lead to tissue damage and subsequent scar tissue formation. As the scar tissue accumulates, the liver loses some of its elasticity and becomes stiffer. Liver elastography involves the use of a surface ultrasound probe that delivers a low frequency pulse or shear wave to a small volume of liver tissue under the rib cage. The transmission of the sound wave is completely painless. How Is a Liver Elastography Performed? The liver is located in the right upper abdomen under the rib cage. Patients are asked to lie flat on an examination table. A technician places the FibroScan probe between the ribs on the right side of the lower chest wall. A series of 10 painless pulses are then applied to the liver. The results are recorded on the equipment and an overall liver stiffness score is generated. This score is then interpreted by a qualified physician to predict the likelihood of advanced fibrosis or cirrhosis.  Patients are asked to wear loose clothing and should not consume any liquids or solids for a minimum of 4 hours before the test to increase the likelihood of obtaining reliable test results. The scan will take 10 to 15 minutes to complete, but patients should plan on being available for 30 minutes  to allow time for preparation

## 2016-11-02 NOTE — Progress Notes (Signed)
Subjective:    Patient ID: [name], male    DOB: Dec 17, 1960, 56 y.o.   MRN: [id number]  HPI [name] is a 56 year old male with a past medical history of hepatitis C on treatment through ID clinic with Mavyret (1 week of therapy remaining with recent negative viral load), history of chronic active hepatitis B, end-stage renal disease on dialysis Monday Wednesday Friday, substance abuse, history of colon polyps, symptomatic internal hemorrhoids status post banding 1 by Dr. Carlean Purl in 2015 who is seen for follow-up and to discuss further hemorrhoidal symptoms and banding. He is here alone today.  He reports that he has tolerated hepatitis C treatment well. He has one week of therapy remaining with plans for follow-up lab tests on 11/09/2016. He denies abdominal pain. Denies jaundice, itching. Appetite has been decreased but he denies nausea and vomiting. Denies dysphagia and odynophagia. He has been receiving dialysis as scheduled on Monday Wednesdays and Fridays.  Reports bowel movements is regular without diarrhea though he can feel constipated and have hard or dry/"pasty" stools. He does report issues with hemorrhoids specifically bleeding worse if stools get hard. He also endorses bleeding and soreness when they are flaring. He's had some fecal smearing. He would like to avoid surgery for his hemorrhoids and wishes to consider further hemorrhoidal banding. He has used MiraLAX in the past with some success for his mild constipation.  Anoscopy was previously performed on 10/05/2012 where 7 polyps were removed, all less than 6 mm from the left colon. Internal hemorrhoids were seen at that time. 2 of these polyps were adenomatous the others hyperplastic. Five-year recall was recommended.  He reports that he has been alcohol free over the last 3 months. He also denies ongoing substance abuse including no recent cocaine use. History of crack cocaine use in the past.  He denies a family  history of liver disease.  Review of Systems As per HPI, otherwise negative  Current Medications, Allergies, Past Medical History, Past Surgical History, Family History and Social History were reviewed in [employer] record.      Objective:   Physical Exam BP 124/70   Pulse 61   Ht 5\' 9"  (1.753 m)   Wt 155 lb 3.2 oz (70.4 kg)   SpO2 98%   BMI 22.92 kg/m  Constitutional: Well-developed and well-nourished. No distress. HEENT: Normocephalic and atraumatic. Oropharynx is clear and moist. Conjunctivae are normal.  No scleral icterus, sclerae are muddy Neck: Neck supple. Trachea midline. Cardiovascular: Normal rate, regular rhythm and intact distal pulses. No M/R/G Pulmonary/chest: Effort normal and breath sounds normal. No wheezing, rales or rhonchi. Abdominal: Soft, nontender, nondistended. Bowel sounds active throughout. There are no masses palpable. No hepatosplenomegaly. Extremities: no clubbing, cyanosis, or edema Neurological: Alert and oriented to person place and time. Skin: Skin is warm and dry. Psychiatric: Normal mood and affect. Behavior is normal.  CBC    Component Value Date/Time   WBC 8.6 09/04/2016 2057   RBC 3.30 (L) 09/04/2016 2057   HGB 9.1 (L) 09/04/2016 2057   HCT 27.9 (L) 09/04/2016 2057   PLT 96 (L) 09/04/2016 2057   MCV 84.5 09/04/2016 2057   MCH 27.6 09/04/2016 2057   MCHC 32.6 09/04/2016 2057   RDW 20.5 (H) 09/04/2016 2057   LYMPHSABS 2.6 08/16/2016 2211   MONOABS 0.4 08/16/2016 2211   EOSABS 0.1 08/16/2016 2211   BASOSABS 0.0 08/16/2016 2211   CMP     Component Value Date/Time   NA  135 09/04/2016 2057   K 4.2 09/04/2016 2057   CL 92 (L) 09/04/2016 2057   CO2 30 09/04/2016 2057   GLUCOSE 144 (H) 09/04/2016 2057   BUN 41 (H) 09/04/2016 2057   CREATININE 6.57 (H) 09/04/2016 2057   CALCIUM 10.0 09/04/2016 2057   CALCIUM 8.1 (L) 07/11/2011 1117   PROT 6.9 07/19/2016 2344   ALBUMIN 3.1 (L) 07/19/2016 2344   AST 51 (H)  07/19/2016 2344   ALT 39 07/22/2016 1400   ALKPHOS 89 07/19/2016 2344   BILITOT 1.9 (H) 07/19/2016 2344   GFRNONAA 9 (L) 09/04/2016 2057   GFRAA 10 (L) 09/04/2016 2057   Lab Results  Component Value Date   INR 1.09 08/29/2016   INR 1.22 07/23/2016   INR 1.2 (H) 07/22/2016   CT ABDOMEN AND PELVIS WITH CONTRAST   TECHNIQUE: Multidetector CT imaging of the abdomen and pelvis was performed using the standard protocol following bolus administration of intravenous contrast.   CONTRAST:  145mL ISOVUE-300 IOPAMIDOL (ISOVUE-300) INJECTION 61%   COMPARISON:  Abdomen ultrasound 12/23/2010.   FINDINGS: Cardiomegaly. No pericardial effusion. Elevation of the left hemidiaphragm with basilar and dependent lower lobe pulmonary opacity bilaterally which most resembles atelectasis. No pleural effusion.   Diffuse sclerosis of osseous structures suggesting renal osteodystrophy. Advanced lumbosacral junction disc and endplate degeneration. No acute osseous abnormality identified.   Advanced and diffuse calcified atherosclerosis of the aorta, and all major arterial structures throughout the abdomen and pelvis. Calcified atherosclerosis continues in the bilateral femoral arteries despite the extent of atherosclerotic disease major arterial structures remain patent.   Small volume pelvic free fluid with simple fluid densitometry. Decompressed rectum and sigmoid colon. Decompressed urinary bladder. Distal small bowel and colonic loops in the pelvis are of similar caliber and difficult to differentiate. There is fluid in lipid material in distal bowel loops incidentally noted (coronal image 96). No abnormally dilated loops. The appendix is identified along the right pelvic side wall on series 9, image 66 and appears within normal limits. Redundant large bowel containing stool, but still relatively decompressed of the splenic flexure.   No abdominal free air. Stomach and duodenum largely  are decompressed.   Hepatomegaly with periportal edema centrally. Trace free fluid about the liver, primarily at the porta hepatis. No definite discrete liver lesion. The spleen, pancreas and adrenal glands are within normal limits.   Native renal atrophy. As expected, no renal contrast excretion on delayed images. No definite lymphadenopathy in the abdomen or pelvis.   IMPRESSION: 1. Nonspecific small volume of ascites in the abdomen and pelvis. Hepatomegaly with central periportal edema. Query acute on chronic hepatitis. 2. No evidence of bowel obstruction. Small and large bowel loops are difficult to differentiate in the lower abdomen and pelvis. 3. Severe diffuse calcified atherosclerosis of the aorta and all other major arterial structures in the abdomen and pelvis. Despite this major arterial structures remain patent. 4. Cardiomegaly.  Native renal atrophy. 5. Mild lung base atelectasis. 6. Renal osteodystrophy.     Electronically Signed   By: Genevie Ann M.D.   On: 10/08/2015 10:09      Assessment & Plan:  56 year old male with a past medical history of hepatitis C on treatment through ID clinic with Mavyret (1 week of therapy remaining with recent negative viral load), history of chronic active hepatitis B, end-stage renal disease on dialysis Monday Wednesday Friday, substance abuse, history of colon polyps, symptomatic internal hemorrhoids status post banding 1 by Dr. Carlean Purl in 2015 who is  seen for follow-up and to discuss further hemorrhoidal symptoms and banding  1. Chronic hepatitis C near completion of therapy with Mavyret/possibly chronic active hepatitis B -- to this point he has had sustained virologic response with negative hep C viral load. Hopefully he will be cured. Infectious disease clinic will follow-up hepatitis C antibody after therapy as well as recheck hepatitis B surface antigen to determine the need for chronic hepatitis B treatment. Question remains  whether he has cirrhosis. I do have suspicion for advanced liver fibrosis. His platelets are low chronically. His INR has been normal. I have recommended ultrasound elastography, previously ordered but not completed. This will be ordered today. If convincing evidence for cirrhosis based on elastography I would recommend [place] screening ultrasounds every 6 months and EGD for variceal screening. He is aware of this recommendation and willing to undergo ultrasound elastography. I commended him for staying away from alcohol and recommended that he try strictly to avoid alcohol and illicit drug use going for given his chronic medical conditions including hepatitis.  2. Internal hemorrhoids -- symptomatic and exacerbated by constipation. We discussed immotile banding at length today. I do feel he remains a candidate for this. Banding offered today but he chose to schedule a future appointment. I recommended MiraLAX 17 g on a daily basis to avoid constipation which will exacerbate hemorrhoids and hemorrhoid symptoms.  3. History of colon polyps -- surveillance colonoscopy recommended March 2019.  40 minutes spent with the patient today. Greater than 50% was spent in counseling and coordination of care with the patient

## 2016-11-03 DIAGNOSIS — D509 Iron deficiency anemia, unspecified: Secondary | ICD-10-CM | POA: Diagnosis not present

## 2016-11-03 DIAGNOSIS — D631 Anemia in chronic kidney disease: Secondary | ICD-10-CM | POA: Diagnosis not present

## 2016-11-03 DIAGNOSIS — N2581 Secondary hyperparathyroidism of renal origin: Secondary | ICD-10-CM | POA: Diagnosis not present

## 2016-11-03 DIAGNOSIS — N186 End stage renal disease: Secondary | ICD-10-CM | POA: Diagnosis not present

## 2016-11-05 DIAGNOSIS — D631 Anemia in chronic kidney disease: Secondary | ICD-10-CM | POA: Diagnosis not present

## 2016-11-05 DIAGNOSIS — N186 End stage renal disease: Secondary | ICD-10-CM | POA: Diagnosis not present

## 2016-11-05 DIAGNOSIS — N2581 Secondary hyperparathyroidism of renal origin: Secondary | ICD-10-CM | POA: Diagnosis not present

## 2016-11-05 DIAGNOSIS — D509 Iron deficiency anemia, unspecified: Secondary | ICD-10-CM | POA: Diagnosis not present

## 2016-11-08 DIAGNOSIS — I120 Hypertensive chronic kidney disease with stage 5 chronic kidney disease or end stage renal disease: Secondary | ICD-10-CM | POA: Diagnosis not present

## 2016-11-08 DIAGNOSIS — N2581 Secondary hyperparathyroidism of renal origin: Secondary | ICD-10-CM | POA: Diagnosis not present

## 2016-11-08 DIAGNOSIS — D631 Anemia in chronic kidney disease: Secondary | ICD-10-CM | POA: Diagnosis not present

## 2016-11-08 DIAGNOSIS — D509 Iron deficiency anemia, unspecified: Secondary | ICD-10-CM | POA: Diagnosis not present

## 2016-11-08 DIAGNOSIS — N186 End stage renal disease: Secondary | ICD-10-CM | POA: Diagnosis not present

## 2016-11-08 DIAGNOSIS — Z992 Dependence on renal dialysis: Secondary | ICD-10-CM | POA: Diagnosis not present

## 2016-11-09 ENCOUNTER — Ambulatory Visit (HOSPITAL_COMMUNITY)
Admission: RE | Admit: 2016-11-09 | Discharge: 2016-11-09 | Disposition: A | Discharge status: Home / Self Care | Payer: Medicare Other | Source: Ambulatory Visit | Attending: Internal Medicine | Admitting: Internal Medicine

## 2016-11-09 ENCOUNTER — Ambulatory Visit: Payer: Self-pay

## 2016-11-09 DIAGNOSIS — B1910 Unspecified viral hepatitis B without hepatic coma: Secondary | ICD-10-CM

## 2016-11-09 DIAGNOSIS — K648 Other hemorrhoids: Secondary | ICD-10-CM

## 2016-11-09 DIAGNOSIS — B1920 Unspecified viral hepatitis C without hepatic coma: Secondary | ICD-10-CM

## 2016-11-10 DIAGNOSIS — D631 Anemia in chronic kidney disease: Secondary | ICD-10-CM | POA: Diagnosis not present

## 2016-11-10 DIAGNOSIS — N2581 Secondary hyperparathyroidism of renal origin: Secondary | ICD-10-CM | POA: Diagnosis not present

## 2016-11-10 DIAGNOSIS — N186 End stage renal disease: Secondary | ICD-10-CM | POA: Diagnosis not present

## 2016-11-10 DIAGNOSIS — D509 Iron deficiency anemia, unspecified: Secondary | ICD-10-CM | POA: Diagnosis not present

## 2016-11-11 ENCOUNTER — Ambulatory Visit (HOSPITAL_COMMUNITY)
Admission: RE | Admit: 2016-11-11 | Discharge: 2016-11-11 | Disposition: A | Discharge status: Home / Self Care | Payer: Medicare Other | Source: Ambulatory Visit | Attending: Internal Medicine | Admitting: Internal Medicine

## 2016-11-11 DIAGNOSIS — B1910 Unspecified viral hepatitis B without hepatic coma: Secondary | ICD-10-CM | POA: Diagnosis not present

## 2016-11-11 DIAGNOSIS — R93421 Abnormal radiologic findings on diagnostic imaging of right kidney: Secondary | ICD-10-CM | POA: Diagnosis not present

## 2016-11-11 DIAGNOSIS — N200 Calculus of kidney: Secondary | ICD-10-CM | POA: Diagnosis not present

## 2016-11-11 DIAGNOSIS — R93422 Abnormal radiologic findings on diagnostic imaging of left kidney: Secondary | ICD-10-CM | POA: Insufficient documentation

## 2016-11-11 DIAGNOSIS — N281 Cyst of kidney, acquired: Secondary | ICD-10-CM | POA: Insufficient documentation

## 2016-11-11 DIAGNOSIS — K648 Other hemorrhoids: Secondary | ICD-10-CM | POA: Insufficient documentation

## 2016-11-11 DIAGNOSIS — B1920 Unspecified viral hepatitis C without hepatic coma: Secondary | ICD-10-CM | POA: Insufficient documentation

## 2016-11-12 DIAGNOSIS — N186 End stage renal disease: Secondary | ICD-10-CM | POA: Diagnosis not present

## 2016-11-12 DIAGNOSIS — D509 Iron deficiency anemia, unspecified: Secondary | ICD-10-CM | POA: Diagnosis not present

## 2016-11-12 DIAGNOSIS — D631 Anemia in chronic kidney disease: Secondary | ICD-10-CM | POA: Diagnosis not present

## 2016-11-12 DIAGNOSIS — N2581 Secondary hyperparathyroidism of renal origin: Secondary | ICD-10-CM | POA: Diagnosis not present

## 2016-11-14 ENCOUNTER — Emergency Department (HOSPITAL_COMMUNITY): Payer: Medicare Other

## 2016-11-14 ENCOUNTER — Emergency Department (HOSPITAL_COMMUNITY)
Admission: EM | Admit: 2016-11-14 | Discharge: 2016-11-14 | Disposition: A | Discharge status: Home / Self Care | Payer: Medicare Other | Attending: Emergency Medicine | Admitting: Emergency Medicine

## 2016-11-14 ENCOUNTER — Encounter (HOSPITAL_COMMUNITY): Payer: Self-pay | Admitting: Emergency Medicine

## 2016-11-14 DIAGNOSIS — N186 End stage renal disease: Secondary | ICD-10-CM | POA: Insufficient documentation

## 2016-11-14 DIAGNOSIS — F17210 Nicotine dependence, cigarettes, uncomplicated: Secondary | ICD-10-CM | POA: Diagnosis not present

## 2016-11-14 DIAGNOSIS — I120 Hypertensive chronic kidney disease with stage 5 chronic kidney disease or end stage renal disease: Secondary | ICD-10-CM | POA: Diagnosis not present

## 2016-11-14 DIAGNOSIS — M79622 Pain in left upper arm: Secondary | ICD-10-CM | POA: Diagnosis not present

## 2016-11-14 DIAGNOSIS — J449 Chronic obstructive pulmonary disease, unspecified: Secondary | ICD-10-CM | POA: Diagnosis not present

## 2016-11-14 DIAGNOSIS — Z79899 Other long term (current) drug therapy: Secondary | ICD-10-CM | POA: Diagnosis not present

## 2016-11-14 DIAGNOSIS — R0602 Shortness of breath: Secondary | ICD-10-CM | POA: Diagnosis not present

## 2016-11-14 DIAGNOSIS — Z7982 Long term (current) use of aspirin: Secondary | ICD-10-CM | POA: Insufficient documentation

## 2016-11-14 DIAGNOSIS — Z992 Dependence on renal dialysis: Secondary | ICD-10-CM | POA: Diagnosis not present

## 2016-11-14 DIAGNOSIS — M79602 Pain in left arm: Secondary | ICD-10-CM | POA: Insufficient documentation

## 2016-11-14 MED ORDER — HYDROCODONE-ACETAMINOPHEN 5-325 MG PO TABS: 1.0000 | ORAL_TABLET | Freq: Four times a day (QID) | ORAL | 0 refills | Status: DC | PRN

## 2016-11-14 MED ORDER — OXYCODONE-ACETAMINOPHEN 5-325 MG PO TABS
1.0000 | ORAL_TABLET | Freq: Once | ORAL | Status: AC
  Administered 2016-11-14: 1 via ORAL
  Filled 2016-11-14: qty 1

## 2016-11-14 NOTE — ED Triage Notes (Signed)
Pt in with complaint of L upper arm fistula "nerve pain". Pt states he has been compliant with MWF dialysis, respirations unlabored. States he is in 8/10 pain

## 2016-11-14 NOTE — ED Notes (Signed)
Patient transported to X-ray 

## 2016-11-14 NOTE — ED Provider Notes (Signed)
[facility] DEPT Provider Note   CSN: [id number] Arrival date & time: 11/14/16  6160     History   Chief Complaint Chief Complaint  Patient presents with  . Vascular Access Problem    HPI [name] is a 56 y.o. male.  HPI Patient presents to the emergency department with arm pain that is chronic for the patient states been dealing with this for a year.  He states he gets shooting tingling and pain in his left arm.  The patient states that he has talked with his primary doctor about this issue.  The patient states that nothing seems make the condition better or worse.  He states it seems to be worse at nighttime. The patient denies chest pain,  headache,blurred vision, neck pain, fever, cough, weakness, numbness, dizziness, anorexia, edema, abdominal pain, nausea, vomiting, diarrhea, rash, back pain, dysuria, hematemesis, bloody stool, near syncope, or syncope. Past Medical History:  Diagnosis Date  . Adenomatous colon polyp    tubular  . Anemia   . Anxiety   . Atherosclerosis of aorta ([place])   . Cardiomegaly   . Chest pain    DATE UNKNOWN, C/O PERIODICALLY  . Chronic kidney disease    dialysis - M/W/F  . Cocaine abuse   . COPD exacerbation ([place]) 08/17/2016  . Dialysis patient ([place])    Monday-Wednesday-Friday  . GERD (gastroesophageal reflux disease)    DATE UNKNOWN  . Hemorrhoids   . Hepatitis B, chronic ([place])   . Hepatitis C   . Hyperkalemia   . Hypertension   . Metabolic bone disease    Patient denies  . Pneumonia   . Pulmonary edema   . Renal disorder   . Renal insufficiency   . Shortness of breath dyspnea    " for the last past year with this dialysis"  . Tubular adenoma of colon     Patient Active Problem List   Diagnosis Date Noted  . COPD GOLD II/ still smoking 09/27/2016  . Essential hypertension 09/27/2016  . Fluid overload 08/30/2016  . Acute respiratory failure with hypoxia ([place]) 08/17/2016  . COPD exacerbation ([place]) 08/17/2016  .  Hypertensive urgency 08/17/2016  . Acute bronchitis 08/17/2016  . Elevated troponin 08/17/2016  . Respiratory failure ([place]) 08/17/2016  . Problem with dialysis access ([place]) 07/23/2016  . End stage renal disease ([place]) 12/02/2015  . Chronic hepatitis B ([place]) 03/05/2014  . Chronic hepatitis C without hepatic coma ([place]) 03/05/2014  . Internal hemorrhoids with bleeding, swelling and itching 03/05/2014  . Thrombocytopenia ([place]) 03/05/2014  . Chest pain 02/27/2014  . Alcohol abuse 04/14/2009  . Cigarette smoker 04/14/2009  . GANGLION CYST 04/14/2009    Past Surgical History:  Procedure Laterality Date  . AV FISTULA PLACEMENT  2012   BELIEVED WAS PLACED IN JUNE  . HEMORRHOID BANDING    . LIGATION OF ARTERIOVENOUS  FISTULA Left 01/11/7105   Procedure: Plication of Left Arm Arteriovenous Fistula;  Surgeon: Elam Dutch, MD;  Location: [facility];  Service: Vascular;  Laterality: Left;  . REVISON OF ARTERIOVENOUS FISTULA Left 2/69/4854   Procedure: PLICATION OF DISTAL ANEURYSMAL SEGEMENT OF LEFT UPPER ARM ARTERIOVENOUS FISTULA;  Surgeon: Elam Dutch, MD;  Location: [facility];  Service: Vascular;  Laterality: Left;  . REVISON OF ARTERIOVENOUS FISTULA Left 01/05/349   Procedure: Plication of Left Upper Arm Fistula ;  Surgeon: Waynetta Sandy, MD;  Location: [facility];  Service: Vascular;  Laterality: Left;       Home Medications  Prior to Admission medications   Medication Sig Start Date End Date Taking? Authorizing Provider  albuterol (PROVENTIL HFA;VENTOLIN HFA) 108 (90 Base) MCG/ACT inhaler Inhale 1-2 puffs into the lungs every 6 (six) hours as needed for wheezing or shortness of breath. 07/11/16  Yes [name] J, PA-C  amLODipine (NORVASC) 10 MG tablet Take 10 mg by mouth at bedtime.    Yes [provider]  aspirin EC 81 MG tablet Take 81 mg by mouth daily.   Yes [provider]  budesonide-formoterol (SYMBICORT) 160-4.5 MCG/ACT inhaler Take 2 puffs first  thing in am and then another 2 puffs about 12 hours later. 09/27/16  Yes Tanda Rockers, MD  carvedilol (COREG) 25 MG tablet Take 25 mg by mouth 2 (two) times daily with a meal.    Yes [provider]  cinacalcet (SENSIPAR) 30 MG tablet Take 30 mg by mouth daily.   Yes [provider]  Glecaprevir-Pibrentasvir (MAVYRET) 100-40 MG TABS Take 3 tablets by mouth daily. 08/02/16  Yes Comer, Okey Regal, MD  hydrOXYzine (VISTARIL) 25 MG capsule Take 25 mg by mouth at bedtime.   Yes [provider]  lisinopril (PRINIVIL,ZESTRIL) 20 MG tablet Take 40 mg by mouth daily.   Yes [provider]  polyethylene glycol powder (GLYCOLAX/MIRALAX) powder MIX 17 GM WITH 8 OUNCES OF LIQUID AND TAKE BY MOUTH DAILY 09/22/16  Yes Gatha Mayer, MD  pravastatin (PRAVACHOL) 20 MG tablet Take 20 mg by mouth daily.   Yes [provider]  sertraline (ZOLOFT) 25 MG tablet Take 25 mg by mouth daily.   Yes [provider]  sevelamer carbonate (RENVELA) 800 MG tablet Take 800 mg by mouth 3 (three) times daily with meals.    Yes [provider]  traZODone (DESYREL) 50 MG tablet Take 50-100 mg by mouth at bedtime as needed for sleep.   Yes [provider]    Family History Family History  Problem Relation Age of Onset  . Heart disease Mother   . Lung cancer Mother   . Heart disease Father   . Malignant hyperthermia Father   . Throat cancer Sister   . Hypertension Other   . COPD Other   . Colon cancer Neg Hx     Social History Social History  Substance Use Topics  . Smoking status: Current Every Day Smoker    Packs/day: 1.00    Years: 43.00    Types: Cigarettes  . Smokeless tobacco: Never Used     Comment: 2-3 cigarettes per day  . Alcohol use No     Comment: quit     Allergies   Aspirin; Clonidine derivatives; and Tramadol   Review of Systems Review of Systems All other systems negative except as documented in the HPI. All pertinent  positives and negatives as reviewed in the HPI.   Physical Exam Updated Vital Signs BP (!) 98/53   Pulse 76   Wt 70.3 kg   SpO2 96%   BMI 22.89 kg/m   Physical Exam  Constitutional: He is oriented to person, place, and time. He appears well-developed and well-nourished. No distress.  HENT:  Head: Normocephalic and atraumatic.  Mouth/Throat: Oropharynx is clear and moist.  Eyes: Pupils are equal, round, and reactive to light.  Neck: Normal range of motion. Neck supple.  Cardiovascular: Normal rate, regular rhythm and normal heart sounds.  Exam reveals no gallop and no friction rub.   No murmur heard. Pulmonary/Chest: Effort normal and breath sounds normal.  No respiratory distress. He has no wheezes.  Musculoskeletal: He exhibits no edema.       Left upper arm: He exhibits tenderness. He exhibits no bony tenderness, no swelling, no edema and no deformity.  Neurological: He is alert and oriented to person, place, and time. He exhibits normal muscle tone. Coordination normal.  Skin: Skin is warm and dry. Capillary refill takes less than 2 seconds. No rash noted. No erythema.  Psychiatric: He has a normal mood and affect. His behavior is normal.  Nursing note and vitals reviewed.    ED Treatments / Results  Labs (all labs ordered are listed, but only abnormal results are displayed) Labs Reviewed - No data to display  EKG  EKG Interpretation None       Radiology Dg Chest 2 View  Result Date: 11/14/2016 CLINICAL DATA:  56 year old male with progressive chronic mid chest pain and shortness of breath. Dialysis patient. EXAM: CHEST  2 VIEW COMPARISON:  09/04/2016 and earlier. FINDINGS: Stable cardiomegaly and mediastinal contours. Stable lung volumes and mild elevation of the left hemidiaphragm. No pneumothorax, pulmonary edema, pleural effusion or confluent pulmonary opacity. Visualized tracheal air column is within normal limits. Negative visible bowel gas pattern. No acute  osseous abnormality identified. Calcified aortic atherosclerosis. Calcified bilateral axillary and right subclavian artery region atherosclerosis. IMPRESSION: Stable cardiomegaly. No acute cardiopulmonary abnormality. Electronically Signed   By: Genevie Ann M.D.   On: 11/14/2016 11:05    Procedures Procedures (including critical care time)  Medications Ordered in ED Medications  oxyCODONE-acetaminophen (PERCOCET/ROXICET) 5-325 MG per tablet 1 tablet (1 tablet Oral Given 11/14/16 1021)     Initial Impression / Assessment and Plan / ED Course  I have reviewed the triage vital signs and the nursing notes.  Pertinent labs & imaging results that were available during my care of the patient were reviewed by me and considered in my medical decision making (see chart for details).     Patient has been dealing with this issue chronically.  The patient follow-up with his primary doctor, told to return here as needed.  Patient agrees the plan and all questions were answered  Final Clinical Impressions(s) / ED Diagnoses   Final diagnoses:  None    New Prescriptions New Prescriptions   No medications on file     Rebeca Allegra 11/14/16 1228    Lacretia Leigh, MD 11/18/16 1723

## 2016-11-14 NOTE — Discharge Instructions (Signed)
Return here as needed.  Follow-up with your primary care doctor °

## 2016-11-15 ENCOUNTER — Ambulatory Visit (INDEPENDENT_AMBULATORY_CARE_PROVIDER_SITE_OTHER): Payer: Medicare Other | Admitting: Internal Medicine

## 2016-11-15 ENCOUNTER — Encounter: Payer: Self-pay | Admitting: Internal Medicine

## 2016-11-15 VITALS — BP 98/68 | Ht 70.0 in | Wt 154.0 lb

## 2016-11-15 DIAGNOSIS — N186 End stage renal disease: Secondary | ICD-10-CM | POA: Diagnosis not present

## 2016-11-15 DIAGNOSIS — D631 Anemia in chronic kidney disease: Secondary | ICD-10-CM | POA: Diagnosis not present

## 2016-11-15 DIAGNOSIS — K648 Other hemorrhoids: Secondary | ICD-10-CM | POA: Diagnosis not present

## 2016-11-15 DIAGNOSIS — N2581 Secondary hyperparathyroidism of renal origin: Secondary | ICD-10-CM | POA: Diagnosis not present

## 2016-11-15 DIAGNOSIS — D509 Iron deficiency anemia, unspecified: Secondary | ICD-10-CM | POA: Diagnosis not present

## 2016-11-15 DIAGNOSIS — K7400 Hepatic fibrosis, unspecified: Secondary | ICD-10-CM

## 2016-11-15 DIAGNOSIS — Z8619 Personal history of other infectious and parasitic diseases: Secondary | ICD-10-CM | POA: Diagnosis not present

## 2016-11-15 DIAGNOSIS — I8500 Esophageal varices without bleeding: Secondary | ICD-10-CM

## 2016-11-15 NOTE — Progress Notes (Signed)
   Subjective:    Patient ID: [name], male    DOB: 1960/11/10, 56 y.o.   MRN: [id number]  HPI [name] is a 56 year old male with a history of hep C now recently completed antiviral therapy with Mavyret, history of chronic active hep B, ESRD on HD, history of polysubstance abuse, history of colon polyps and symptomatic internal hemorrhoids who presented today for consideration of hemorrhoidal banding. He was seen about 2 weeks ago.  Today he is quite lightheaded after finishing dialysis. Dialysis ended slightly early due to hypotension. This been an issue for him since antiviral hepatitis C treatment. He feels lightheaded with standing. He denies chest pain, shortness of breath and abdominal pain.  He did very recently, 4 days ago, had an ultrasound elastography showing F3 plus F4 fibrosis. Liver did not appear nodular and the spleen was not enlarged. However his platelets have been chronically low.  He has had intermittent rectal bleeding particularly when stools are harder. No melena   Review of Systems As per history of present illness, otherwise negative  Current Medications, Allergies, Past Medical History, Past Surgical History, Family History and Social History were reviewed in [employer] record.     Objective:   Physical Exam BP 98/68 Comment: lightheaded  Ht 5\' 10"  (1.778 m)   Wt 154 lb (69.9 kg)   BMI 22.10 kg/m  Constitutional: Well-developed and well-nourished. No distress. HEENT: Normocephalic and atraumatic.   No scleral icterus. Neck: Neck supple. Trachea midline. Cardiovascular: Normal rate, regular rhythm and intact distal pulses. No M/R/G Pulmonary/chest: Effort normal and breath sounds normal. No wheezing, rales or rhonchi. Abdominal: Soft, nontender, nondistended. Bowel sounds active throughout.  Extremities: no clubbing, cyanosis, or edema, Fistula left arm Neurological: Alert and oriented to person place and time. Skin:  Skin is warm and dry. Psychiatric: Normal mood and affect. Behavior is normal.      Assessment & Plan:  56 year old male with a history of hep C now recently completed antiviral therapy with Mavyret, history of chronic active hep B, ESRD on HD, history of polysubstance abuse, history of colon polyps and symptomatic internal hemorrhoids who presented today for consideration of hemorrhoidal banding.  1. Symptomatic internal hemorrhoids -- I recommended postponing hemorrhoidal banding today due to symptomatic hypotension. He will be rescheduled on a Tuesday or Thursday which is a nondialysis day for him.  2. Liver fibrosis with history of hep C, hep B and previous alcohol use/abuse -- we discussed how elastography can at times overestimate fibrosis within the liver. The spleen was not enlarged but his platelets are chronically low. Given these findings I have recommended that we perform upper endoscopy for variceal screening. We discussed the risks, benefits and alternatives and he wishes to proceed. --He will follow-up this week with infectious disease for a repeat hepatitis C and hepatitis B viral load.  15 minutes spent with the patient today. Greater than 50% was spent in counseling and coordination of care with the patient

## 2016-11-15 NOTE — Patient Instructions (Addendum)
You have been scheduled for an endoscopy. Please follow written instructions given to you at your visit today. If you use inhalers (even only as needed), please bring them with you on the day of your procedure. Your physician has requested that you go to www.startemmi.com and enter the access code given to you at your visit today. This web site gives a general overview about your procedure. However, you should still follow specific instructions given to you by our office regarding your preparation for the procedure.  You have been scheduled for hemorrhoidal banding on Thursday, 01/13/17 @ 3:30 pm.  If you are age 102 or older, your body mass index should be between 23-30. Your Body mass index is 22.1 kg/m. If this is out of the aforementioned range listed, please consider follow up with your Primary Care Provider.  If you are age 31 or younger, your body mass index should be between 19-25. Your Body mass index is 22.1 kg/m. If this is out of the aformentioned range listed, please consider follow up with your Primary Care Provider.

## 2016-11-16 ENCOUNTER — Ambulatory Visit (INDEPENDENT_AMBULATORY_CARE_PROVIDER_SITE_OTHER): Payer: Medicare Other | Admitting: Pharmacist Clinician (PhC)/ Clinical Pharmacy Specialist

## 2016-11-16 DIAGNOSIS — B181 Chronic viral hepatitis B without delta-agent: Secondary | ICD-10-CM | POA: Diagnosis not present

## 2016-11-16 DIAGNOSIS — B182 Chronic viral hepatitis C: Secondary | ICD-10-CM | POA: Diagnosis not present

## 2016-11-16 NOTE — Patient Instructions (Addendum)
We will do lab today Come back 8/7 for the cure lab then a cure visit with Dr. Linus Salmons on 8/21

## 2016-11-16 NOTE — Progress Notes (Signed)
HPI: [name] is a 56 y.o. male who is here for his EOT VL today of his hep C.  Lab Results  Component Value Date   HCVGENOTYPE 1a 07/22/2016    Allergies: Allergies  Allergen Reactions  . Aspirin Other (See Comments)    STOMACH PAIN  . Clonidine Derivatives Itching  . Tramadol Itching    Vitals:    Past Medical History: Past Medical History:  Diagnosis Date  . Adenomatous colon polyp    tubular  . Anemia   . Anxiety   . Atherosclerosis of aorta ([place])   . Cardiomegaly   . Chest pain    DATE UNKNOWN, C/O PERIODICALLY  . Chronic kidney disease    dialysis - M/W/F  . Cocaine abuse   . COPD exacerbation ([place]) 08/17/2016  . Dialysis patient ([place])    Monday-Wednesday-Friday  . GERD (gastroesophageal reflux disease)    DATE UNKNOWN  . Hemorrhoids   . Hepatitis B, chronic ([place])   . Hepatitis C   . Hyperkalemia   . Hypertension   . Metabolic bone disease    Patient denies  . Pneumonia   . Pulmonary edema   . Renal disorder   . Renal insufficiency   . Shortness of breath dyspnea    " for the last past year with this dialysis"  . Tubular adenoma of colon     Social History: Social History   Social History  . Marital status: Single    Spouse name: N/A  . Number of children: 3  . Years of education: 10   Occupational History  . Unemployed    Social History Main Topics  . Smoking status: Current Every Day Smoker    Packs/day: 1.00    Years: 43.00    Types: Cigarettes  . Smokeless tobacco: Never Used     Comment: 2-3 cigarettes per day  . Alcohol use No     Comment: quit  . Drug use: No     Comment: last used cocaine 2 weeks ago 07/22/16  . Sexual activity: Not on file     Comment: "NOT LATELY" on cocaine   Other Topics Concern  . Not on file   Social History Narrative   Lives alone   Caffeine use: Coffee-rare   Soda- daily       Labs: Hep B S Ab (no units)  Date Value  07/11/2011 NEGATIVE   Hepatitis B Surface Ag (no units)  Date  Value  07/22/2016 POSITIVE (A)   HCV Ab (no units)  Date Value  12/23/2010 Reactive (A)    Lab Results  Component Value Date   HCVGENOTYPE 1a 07/22/2016    Hepatitis C RNA quantitative Latest Ref Rng & Units 09/14/2016 03/05/2014  HCV Quantitative NOT DETECTED IU/mL <15 NOT DETECTED 9,563,875(I)  HCV Quantitative Log NOT DETECTED Log IU/mL <1.18 NOT DETECTED 6.46(H)    AST (U/L)  Date Value  07/19/2016 51 (H)  07/11/2016 68 (H)  06/24/2016 78 (H)   ALT (U/L)  Date Value  07/22/2016 39  07/19/2016 42  07/11/2016 52  06/24/2016 41   INR (no units)  Date Value  08/29/2016 1.09  07/23/2016 1.22  07/22/2016 1.2 (H)    CrCl: CrCl cannot be calculated (Patient's most recent lab result is older than the maximum 21 days allowed.).  Fibrosis Score: F3/4 as assessed by ARFI  Child-Pugh Score: Class A/B  Previous Treatment Regimen: None  Assessment: [name] finished his Mavyret about last Friday while I saw  in the [facility]. He stated that he did not miss doses. Param has multiple cormorbidities. Including ESRD, hep B, and Childpugh A/B. His Plt <100k. Hopefully, he can be cured with Mavyret. His VL has been undetectable so far. We are going to get the EOT hep C VL today and hep B DNA to check for re-activation. We tried to put him on TDF at the beginning but took daily x4 instead of weekly. Therefore, we stopped the therapy at that time but monitoring a bit more closely.   Recommendations:  HCV VL, hep B DNA, CMP today SVR12 in 3 months then Dr. Lorenda Hatchet [place], [place].D., BCPS, AAHIVP Clinical [facility] Infectious Disease 11/16/2016, 4:20 PM

## 2016-11-17 DIAGNOSIS — N2581 Secondary hyperparathyroidism of renal origin: Secondary | ICD-10-CM | POA: Diagnosis not present

## 2016-11-17 DIAGNOSIS — D631 Anemia in chronic kidney disease: Secondary | ICD-10-CM | POA: Diagnosis not present

## 2016-11-17 DIAGNOSIS — D509 Iron deficiency anemia, unspecified: Secondary | ICD-10-CM | POA: Diagnosis not present

## 2016-11-17 DIAGNOSIS — N186 End stage renal disease: Secondary | ICD-10-CM | POA: Diagnosis not present

## 2016-11-17 LAB — COMPLETE METABOLIC PANEL WITH GFR
ALT: 10 U/L (ref 9–46)
AST: 18 U/L (ref 10–35)
Albumin: 4.2 g/dL (ref 3.6–5.1)
Alkaline Phosphatase: 115 U/L (ref 40–115)
BUN: 34 mg/dL — ABNORMAL HIGH (ref 7–25)
CO2: 26 mmol/L (ref 20–31)
Calcium: 9.5 mg/dL (ref 8.6–10.3)
Chloride: 91 mmol/L — ABNORMAL LOW (ref 98–110)
Creat: 8.59 mg/dL — ABNORMAL HIGH (ref 0.70–1.33)
GFR, Est African American: 7 mL/min — ABNORMAL LOW (ref >=60)
GFR, Est Non African American: 6 mL/min — ABNORMAL LOW (ref >=60)
Glucose, Bld: 87 mg/dL (ref 65–99)
Potassium: 4.4 mmol/L (ref 3.5–5.3)
Sodium: 138 mmol/L (ref 135–146)
Total Bilirubin: 0.5 mg/dL (ref 0.2–1.2)
Total Protein: 7.6 g/dL (ref 6.1–8.1)

## 2016-11-18 DIAGNOSIS — G8929 Other chronic pain: Secondary | ICD-10-CM | POA: Diagnosis not present

## 2016-11-18 DIAGNOSIS — B1910 Unspecified viral hepatitis B without hepatic coma: Secondary | ICD-10-CM | POA: Diagnosis not present

## 2016-11-18 DIAGNOSIS — F419 Anxiety disorder, unspecified: Secondary | ICD-10-CM | POA: Diagnosis not present

## 2016-11-18 DIAGNOSIS — D638 Anemia in other chronic diseases classified elsewhere: Secondary | ICD-10-CM | POA: Diagnosis not present

## 2016-11-18 DIAGNOSIS — G4700 Insomnia, unspecified: Secondary | ICD-10-CM | POA: Diagnosis not present

## 2016-11-18 DIAGNOSIS — I429 Cardiomyopathy, unspecified: Secondary | ICD-10-CM | POA: Diagnosis not present

## 2016-11-18 DIAGNOSIS — N186 End stage renal disease: Secondary | ICD-10-CM | POA: Diagnosis not present

## 2016-11-18 DIAGNOSIS — I10 Essential (primary) hypertension: Secondary | ICD-10-CM | POA: Diagnosis not present

## 2016-11-18 DIAGNOSIS — Z720 Tobacco use: Secondary | ICD-10-CM | POA: Diagnosis not present

## 2016-11-18 DIAGNOSIS — B182 Chronic viral hepatitis C: Secondary | ICD-10-CM | POA: Diagnosis not present

## 2016-11-18 DIAGNOSIS — J45909 Unspecified asthma, uncomplicated: Secondary | ICD-10-CM | POA: Diagnosis not present

## 2016-11-18 DIAGNOSIS — M79602 Pain in left arm: Secondary | ICD-10-CM | POA: Diagnosis not present

## 2016-11-18 LAB — HEPATITIS B DNA, ULTRAQUANTITATIVE, PCR
Hepatitis B DNA (Calc): <1 Log IU/mL
Hepatitis B DNA: <10 IU/mL

## 2016-11-18 LAB — HEPATITIS C RNA QUANTITATIVE
HCV Quantitative Log: <1.18 Log IU/mL
HCV Quantitative: <15 IU/mL

## 2016-11-19 DIAGNOSIS — D509 Iron deficiency anemia, unspecified: Secondary | ICD-10-CM | POA: Diagnosis not present

## 2016-11-19 DIAGNOSIS — N186 End stage renal disease: Secondary | ICD-10-CM | POA: Diagnosis not present

## 2016-11-19 DIAGNOSIS — D631 Anemia in chronic kidney disease: Secondary | ICD-10-CM | POA: Diagnosis not present

## 2016-11-19 DIAGNOSIS — N2581 Secondary hyperparathyroidism of renal origin: Secondary | ICD-10-CM | POA: Diagnosis not present

## 2016-11-22 DIAGNOSIS — D509 Iron deficiency anemia, unspecified: Secondary | ICD-10-CM | POA: Diagnosis not present

## 2016-11-22 DIAGNOSIS — N186 End stage renal disease: Secondary | ICD-10-CM | POA: Diagnosis not present

## 2016-11-22 DIAGNOSIS — N2581 Secondary hyperparathyroidism of renal origin: Secondary | ICD-10-CM | POA: Diagnosis not present

## 2016-11-22 DIAGNOSIS — D631 Anemia in chronic kidney disease: Secondary | ICD-10-CM | POA: Diagnosis not present

## 2016-11-24 DIAGNOSIS — N2581 Secondary hyperparathyroidism of renal origin: Secondary | ICD-10-CM | POA: Diagnosis not present

## 2016-11-24 DIAGNOSIS — N186 End stage renal disease: Secondary | ICD-10-CM | POA: Diagnosis not present

## 2016-11-24 DIAGNOSIS — D509 Iron deficiency anemia, unspecified: Secondary | ICD-10-CM | POA: Diagnosis not present

## 2016-11-24 DIAGNOSIS — D631 Anemia in chronic kidney disease: Secondary | ICD-10-CM | POA: Diagnosis not present

## 2016-11-26 DIAGNOSIS — D631 Anemia in chronic kidney disease: Secondary | ICD-10-CM | POA: Diagnosis not present

## 2016-11-26 DIAGNOSIS — N186 End stage renal disease: Secondary | ICD-10-CM | POA: Diagnosis not present

## 2016-11-26 DIAGNOSIS — D509 Iron deficiency anemia, unspecified: Secondary | ICD-10-CM | POA: Diagnosis not present

## 2016-11-26 DIAGNOSIS — N2581 Secondary hyperparathyroidism of renal origin: Secondary | ICD-10-CM | POA: Diagnosis not present

## 2016-11-29 DIAGNOSIS — D509 Iron deficiency anemia, unspecified: Secondary | ICD-10-CM | POA: Diagnosis not present

## 2016-11-29 DIAGNOSIS — D631 Anemia in chronic kidney disease: Secondary | ICD-10-CM | POA: Diagnosis not present

## 2016-11-29 DIAGNOSIS — N186 End stage renal disease: Secondary | ICD-10-CM | POA: Diagnosis not present

## 2016-11-29 DIAGNOSIS — N2581 Secondary hyperparathyroidism of renal origin: Secondary | ICD-10-CM | POA: Diagnosis not present

## 2016-11-30 ENCOUNTER — Ambulatory Visit (AMBULATORY_SURGERY_CENTER): Payer: Medicare Other | Admitting: Internal Medicine

## 2016-11-30 ENCOUNTER — Encounter: Payer: Self-pay | Admitting: Internal Medicine

## 2016-11-30 VITALS — BP 95/45 | HR 76 | Temp 96.2°F | Resp 17 | Ht 69.0 in | Wt 155.0 lb

## 2016-11-30 DIAGNOSIS — K2990 Gastroduodenitis, unspecified, without bleeding: Secondary | ICD-10-CM | POA: Diagnosis not present

## 2016-11-30 DIAGNOSIS — K7400 Hepatic fibrosis, unspecified: Secondary | ICD-10-CM

## 2016-11-30 DIAGNOSIS — K2950 Unspecified chronic gastritis without bleeding: Secondary | ICD-10-CM | POA: Diagnosis not present

## 2016-11-30 DIAGNOSIS — K2970 Gastritis, unspecified, without bleeding: Secondary | ICD-10-CM | POA: Diagnosis not present

## 2016-11-30 DIAGNOSIS — I8500 Esophageal varices without bleeding: Secondary | ICD-10-CM | POA: Diagnosis not present

## 2016-11-30 MED ORDER — SODIUM CHLORIDE 0.9 % IV SOLN: 500.0000 mL | INTRAVENOUS | Status: DC

## 2016-11-30 NOTE — Patient Instructions (Signed)
Discharge instructions given. Biopsies taken. Resume previous medications. YOU HAD AN ENDOSCOPIC PROCEDURE TODAY AT THE [facility]:   Refer to the procedure report that was given to you for any specific questions about what was found during the examination.  If the procedure report does not answer your questions, please call your gastroenterologist to clarify.  If you requested that your care partner not be given the details of your procedure findings, then the procedure report has been included in a sealed envelope for you to review at your convenience later.  YOU SHOULD EXPECT: Some feelings of bloating in the abdomen. Passage of more gas than usual.  Walking can help get rid of the air that was put into your GI tract during the procedure and reduce the bloating. If you had a lower endoscopy (such as a colonoscopy or flexible sigmoidoscopy) you may notice spotting of blood in your stool or on the toilet paper. If you underwent a bowel prep for your procedure, you may not have a normal bowel movement for a few days.  Please Note:  You might notice some irritation and congestion in your nose or some drainage.  This is from the oxygen used during your procedure.  There is no need for concern and it should clear up in a day or so.  SYMPTOMS TO REPORT IMMEDIATELY:   Following upper endoscopy (EGD)  Vomiting of blood or coffee ground material  New chest pain or pain under the shoulder blades  Painful or persistently difficult swallowing  New shortness of breath  Fever of 100F or higher  Black, tarry-looking stools  For urgent or emergent issues, a gastroenterologist can be reached at any hour by calling [phone].   DIET:  We do recommend a small meal at first, but then you may proceed to your regular diet.  Drink plenty of fluids but you should avoid alcoholic beverages for 24 hours.  ACTIVITY:  You should plan to take it easy for the rest of today and you should NOT DRIVE  or use heavy machinery until tomorrow (because of the sedation medicines used during the test).    FOLLOW UP: Our staff will call the number listed on your records the next business day following your procedure to check on you and address any questions or concerns that you may have regarding the information given to you following your procedure. If we do not reach you, we will leave a message.  However, if you are feeling well and you are not experiencing any problems, there is no need to return our call.  We will assume that you have returned to your regular daily activities without incident.  If any biopsies were taken you will be contacted by phone or by letter within the next 1-3 weeks.  Please call us at [phone] if you have not heard about the biopsies in 3 weeks.    SIGNATURES/CONFIDENTIALITY: You and/or your care partner have signed paperwork which will be entered into your electronic medical record.  These signatures attest to the fact that that the information above on your After Visit Summary has been reviewed and is understood.  Full responsibility of the confidentiality of this discharge information lies with you and/or your care-partner. 

## 2016-11-30 NOTE — Progress Notes (Signed)
No egg or soy allergy known to patient  No issues with past sedation with any surgeries  or procedures, no intubation problems  No diet pills per patient No home 02 use per patient  No blood thinners per patient  Pt denies issues with constipation  No A fib or A flutter  Pt has left AV fistula- no bp/stick left arm  Poor historian with meds

## 2016-11-30 NOTE — Progress Notes (Signed)
Called to room to assist during endoscopic procedure.  Patient ID and intended procedure confirmed with present staff. Received instructions for my participation in the procedure from the performing physician.  

## 2016-11-30 NOTE — Progress Notes (Signed)
Spontaneous respirations throughout. VSS. Resting comfortably. To PACU on room air. Report to  Celia RN. 

## 2016-11-30 NOTE — Op Note (Signed)
[place] Patient Name: [name] Procedure Date: 11/30/2016 9:09 AM MRN: [id number] Endoscopist: Jerene Bears , MD Age: 56 Referring MD:  Date of Birth: 12-16-1960 Gender: Male Account #: [id number] Procedure:                Upper GI endoscopy Indications:              Hepatitis C now treated rule out esophageal                            varices, F3+F4 fibrosis by [place] with elastography Medicines:                Monitored Anesthesia Care Procedure:                Pre-Anesthesia Assessment:                           - Prior to the procedure, a History and Physical                            was performed, and patient medications and                            allergies were reviewed. The patient's tolerance of                            previous anesthesia was also reviewed. The risks                            and benefits of the procedure and the sedation                            options and risks were discussed with the patient.                            All questions were answered, and informed consent                            was obtained. Prior Anticoagulants: The patient has                            taken no previous anticoagulant or antiplatelet                            agents. ASA Grade Assessment: III - A patient with                            severe systemic disease. After reviewing the risks                            and benefits, the patient was deemed in                            satisfactory condition to undergo the procedure.  After obtaining informed consent, the endoscope was                            passed under direct vision. Throughout the                            procedure, the patient's blood pressure, pulse, and                            oxygen saturations were monitored continuously. The                            Endoscope was introduced through the mouth, and                            advanced to the  second part of duodenum. The upper                            GI endoscopy was accomplished without difficulty.                            The patient tolerated the procedure well. Scope In: Scope Out: Findings:                 The examined esophagus was normal. No esophageal                            varices.                           A 1 cm hiatal hernia was present.                           Scattered moderate inflammation characterized by                            congestion (edema), erosions and erythema was found                            in the entire examined stomach (most significant in                            the antrum). Biopsies were taken with a cold                            forceps for histology and Helicobacter pylori                            testing (body, antrum, incisura).                           The examined duodenum was normal. Complications:            No immediate complications. Estimated Blood Loss:     Estimated blood loss was minimal. Impression:               -  Normal esophagus.                           - 1 cm hiatal hernia.                           - No evidence of gastroesophageal varices. No                            changes of portal hypertensive gastropathy.                           - Acute gastritis. Biopsied.                           - Normal examined duodenum. Recommendation:           - Patient has a contact number available for                            emergencies. The signs and symptoms of potential                            delayed complications were discussed with the                            patient. Return to normal activities tomorrow.                            Written discharge instructions were provided to the                            patient.                           - Resume previous diet.                           - Continue present medications. Avoid NSAIDs.                           - Await pathology results.                            - Repeat upper endoscopy in 3 years for screening                            purposes. Jerene Bears, MD 11/30/2016 9:35:38 AM This report has been signed electronically.

## 2016-12-01 ENCOUNTER — Telehealth: Payer: Self-pay | Admitting: *Deleted

## 2016-12-01 DIAGNOSIS — N186 End stage renal disease: Secondary | ICD-10-CM | POA: Diagnosis not present

## 2016-12-01 DIAGNOSIS — D509 Iron deficiency anemia, unspecified: Secondary | ICD-10-CM | POA: Diagnosis not present

## 2016-12-01 DIAGNOSIS — N2581 Secondary hyperparathyroidism of renal origin: Secondary | ICD-10-CM | POA: Diagnosis not present

## 2016-12-01 DIAGNOSIS — D631 Anemia in chronic kidney disease: Secondary | ICD-10-CM | POA: Diagnosis not present

## 2016-12-01 NOTE — Telephone Encounter (Signed)
  Follow up Call-  Call back number 11/30/2016  Post procedure Call Back phone  # [phone]  Permission to leave phone message Yes  Some recent data might be hidden     Patient questions:  Do you have a fever, pain , or abdominal swelling? No. Pain Score  0 *  Have you tolerated food without any problems? Yes.    Have you been able to return to your normal activities? Yes.    Do you have any questions about your discharge instructions: Diet   No. Medications  No. Follow up visit  No.  Do you have questions or concerns about your Care? No.  Actions: * If pain score is 4 or above: No action needed, pain <4.

## 2016-12-03 DIAGNOSIS — D509 Iron deficiency anemia, unspecified: Secondary | ICD-10-CM | POA: Diagnosis not present

## 2016-12-03 DIAGNOSIS — D631 Anemia in chronic kidney disease: Secondary | ICD-10-CM | POA: Diagnosis not present

## 2016-12-03 DIAGNOSIS — N186 End stage renal disease: Secondary | ICD-10-CM | POA: Diagnosis not present

## 2016-12-03 DIAGNOSIS — N2581 Secondary hyperparathyroidism of renal origin: Secondary | ICD-10-CM | POA: Diagnosis not present

## 2016-12-06 DIAGNOSIS — N2581 Secondary hyperparathyroidism of renal origin: Secondary | ICD-10-CM | POA: Diagnosis not present

## 2016-12-06 DIAGNOSIS — D509 Iron deficiency anemia, unspecified: Secondary | ICD-10-CM | POA: Diagnosis not present

## 2016-12-06 DIAGNOSIS — D631 Anemia in chronic kidney disease: Secondary | ICD-10-CM | POA: Diagnosis not present

## 2016-12-06 DIAGNOSIS — N186 End stage renal disease: Secondary | ICD-10-CM | POA: Diagnosis not present

## 2016-12-08 DIAGNOSIS — N2581 Secondary hyperparathyroidism of renal origin: Secondary | ICD-10-CM | POA: Diagnosis not present

## 2016-12-08 DIAGNOSIS — N186 End stage renal disease: Secondary | ICD-10-CM | POA: Diagnosis not present

## 2016-12-08 DIAGNOSIS — D509 Iron deficiency anemia, unspecified: Secondary | ICD-10-CM | POA: Diagnosis not present

## 2016-12-08 DIAGNOSIS — D631 Anemia in chronic kidney disease: Secondary | ICD-10-CM | POA: Diagnosis not present

## 2016-12-09 DIAGNOSIS — Z992 Dependence on renal dialysis: Secondary | ICD-10-CM | POA: Diagnosis not present

## 2016-12-09 DIAGNOSIS — G8929 Other chronic pain: Secondary | ICD-10-CM | POA: Diagnosis not present

## 2016-12-09 DIAGNOSIS — M79602 Pain in left arm: Secondary | ICD-10-CM | POA: Diagnosis not present

## 2016-12-09 DIAGNOSIS — N186 End stage renal disease: Secondary | ICD-10-CM | POA: Diagnosis not present

## 2016-12-09 DIAGNOSIS — I120 Hypertensive chronic kidney disease with stage 5 chronic kidney disease or end stage renal disease: Secondary | ICD-10-CM | POA: Diagnosis not present

## 2016-12-10 ENCOUNTER — Encounter: Payer: Self-pay | Admitting: Internal Medicine

## 2016-12-10 DIAGNOSIS — D631 Anemia in chronic kidney disease: Secondary | ICD-10-CM | POA: Diagnosis not present

## 2016-12-10 DIAGNOSIS — N2581 Secondary hyperparathyroidism of renal origin: Secondary | ICD-10-CM | POA: Diagnosis not present

## 2016-12-10 DIAGNOSIS — N186 End stage renal disease: Secondary | ICD-10-CM | POA: Diagnosis not present

## 2016-12-10 DIAGNOSIS — D509 Iron deficiency anemia, unspecified: Secondary | ICD-10-CM | POA: Diagnosis not present

## 2016-12-13 DIAGNOSIS — D631 Anemia in chronic kidney disease: Secondary | ICD-10-CM | POA: Diagnosis not present

## 2016-12-13 DIAGNOSIS — N186 End stage renal disease: Secondary | ICD-10-CM | POA: Diagnosis not present

## 2016-12-13 DIAGNOSIS — N2581 Secondary hyperparathyroidism of renal origin: Secondary | ICD-10-CM | POA: Diagnosis not present

## 2016-12-13 DIAGNOSIS — D509 Iron deficiency anemia, unspecified: Secondary | ICD-10-CM | POA: Diagnosis not present

## 2016-12-15 DIAGNOSIS — D631 Anemia in chronic kidney disease: Secondary | ICD-10-CM | POA: Diagnosis not present

## 2016-12-15 DIAGNOSIS — N2581 Secondary hyperparathyroidism of renal origin: Secondary | ICD-10-CM | POA: Diagnosis not present

## 2016-12-15 DIAGNOSIS — D509 Iron deficiency anemia, unspecified: Secondary | ICD-10-CM | POA: Diagnosis not present

## 2016-12-15 DIAGNOSIS — N186 End stage renal disease: Secondary | ICD-10-CM | POA: Diagnosis not present

## 2016-12-17 ENCOUNTER — Telehealth: Payer: Self-pay | Admitting: Internal Medicine

## 2016-12-17 DIAGNOSIS — D631 Anemia in chronic kidney disease: Secondary | ICD-10-CM | POA: Diagnosis not present

## 2016-12-17 DIAGNOSIS — N2581 Secondary hyperparathyroidism of renal origin: Secondary | ICD-10-CM | POA: Diagnosis not present

## 2016-12-17 DIAGNOSIS — N186 End stage renal disease: Secondary | ICD-10-CM | POA: Diagnosis not present

## 2016-12-17 DIAGNOSIS — D509 Iron deficiency anemia, unspecified: Secondary | ICD-10-CM | POA: Diagnosis not present

## 2016-12-17 NOTE — Telephone Encounter (Signed)
Spoke with pt regarding his path results and path letter, questions were answered.

## 2016-12-20 DIAGNOSIS — N186 End stage renal disease: Secondary | ICD-10-CM | POA: Diagnosis not present

## 2016-12-20 DIAGNOSIS — D631 Anemia in chronic kidney disease: Secondary | ICD-10-CM | POA: Diagnosis not present

## 2016-12-20 DIAGNOSIS — N2581 Secondary hyperparathyroidism of renal origin: Secondary | ICD-10-CM | POA: Diagnosis not present

## 2016-12-20 DIAGNOSIS — D509 Iron deficiency anemia, unspecified: Secondary | ICD-10-CM | POA: Diagnosis not present

## 2016-12-22 ENCOUNTER — Encounter: Payer: Self-pay | Admitting: Internal Medicine

## 2016-12-22 DIAGNOSIS — D631 Anemia in chronic kidney disease: Secondary | ICD-10-CM | POA: Diagnosis not present

## 2016-12-22 DIAGNOSIS — D509 Iron deficiency anemia, unspecified: Secondary | ICD-10-CM | POA: Diagnosis not present

## 2016-12-22 DIAGNOSIS — N186 End stage renal disease: Secondary | ICD-10-CM | POA: Diagnosis not present

## 2016-12-22 DIAGNOSIS — N2581 Secondary hyperparathyroidism of renal origin: Secondary | ICD-10-CM | POA: Diagnosis not present

## 2016-12-24 DIAGNOSIS — N2581 Secondary hyperparathyroidism of renal origin: Secondary | ICD-10-CM | POA: Diagnosis not present

## 2016-12-24 DIAGNOSIS — D509 Iron deficiency anemia, unspecified: Secondary | ICD-10-CM | POA: Diagnosis not present

## 2016-12-24 DIAGNOSIS — D631 Anemia in chronic kidney disease: Secondary | ICD-10-CM | POA: Diagnosis not present

## 2016-12-24 DIAGNOSIS — N186 End stage renal disease: Secondary | ICD-10-CM | POA: Diagnosis not present

## 2016-12-27 DIAGNOSIS — D631 Anemia in chronic kidney disease: Secondary | ICD-10-CM | POA: Diagnosis not present

## 2016-12-27 DIAGNOSIS — N186 End stage renal disease: Secondary | ICD-10-CM | POA: Diagnosis not present

## 2016-12-27 DIAGNOSIS — N2581 Secondary hyperparathyroidism of renal origin: Secondary | ICD-10-CM | POA: Diagnosis not present

## 2016-12-27 DIAGNOSIS — D509 Iron deficiency anemia, unspecified: Secondary | ICD-10-CM | POA: Diagnosis not present

## 2016-12-29 DIAGNOSIS — D509 Iron deficiency anemia, unspecified: Secondary | ICD-10-CM | POA: Diagnosis not present

## 2016-12-29 DIAGNOSIS — F419 Anxiety disorder, unspecified: Secondary | ICD-10-CM | POA: Diagnosis not present

## 2016-12-29 DIAGNOSIS — D638 Anemia in other chronic diseases classified elsewhere: Secondary | ICD-10-CM | POA: Diagnosis not present

## 2016-12-29 DIAGNOSIS — G8929 Other chronic pain: Secondary | ICD-10-CM | POA: Diagnosis not present

## 2016-12-29 DIAGNOSIS — I10 Essential (primary) hypertension: Secondary | ICD-10-CM | POA: Diagnosis not present

## 2016-12-29 DIAGNOSIS — B182 Chronic viral hepatitis C: Secondary | ICD-10-CM | POA: Diagnosis not present

## 2016-12-29 DIAGNOSIS — N186 End stage renal disease: Secondary | ICD-10-CM | POA: Diagnosis not present

## 2016-12-29 DIAGNOSIS — N6002 Solitary cyst of left breast: Secondary | ICD-10-CM | POA: Diagnosis not present

## 2016-12-29 DIAGNOSIS — B1910 Unspecified viral hepatitis B without hepatic coma: Secondary | ICD-10-CM | POA: Diagnosis not present

## 2016-12-29 DIAGNOSIS — Z720 Tobacco use: Secondary | ICD-10-CM | POA: Diagnosis not present

## 2016-12-29 DIAGNOSIS — G4700 Insomnia, unspecified: Secondary | ICD-10-CM | POA: Diagnosis not present

## 2016-12-29 DIAGNOSIS — I429 Cardiomyopathy, unspecified: Secondary | ICD-10-CM | POA: Diagnosis not present

## 2016-12-29 DIAGNOSIS — N2581 Secondary hyperparathyroidism of renal origin: Secondary | ICD-10-CM | POA: Diagnosis not present

## 2016-12-29 DIAGNOSIS — D631 Anemia in chronic kidney disease: Secondary | ICD-10-CM | POA: Diagnosis not present

## 2016-12-29 DIAGNOSIS — J45909 Unspecified asthma, uncomplicated: Secondary | ICD-10-CM | POA: Diagnosis not present

## 2016-12-30 ENCOUNTER — Telehealth: Payer: Self-pay | Admitting: Internal Medicine

## 2016-12-30 NOTE — Telephone Encounter (Signed)
LM w/ family for patient to call back  Last ov 3.19.18 w/ MW: Patient Instructions  Stop advair - if breathing no better next step is see Dr Vista Lawman for trial off lisinopril   Plan A = Automatic = symbicort 160 Take 2 puffs first thing in am and then another 2 puffs about 12 hours later.     Work on inhaler technique:  relax and gently blow all the way out then take a nice smooth deep breath back in, triggering the inhaler at same time you start breathing in.  Hold for up to 5 seconds if you can. Blow out thru nose. Rinse and gargle with water when done    Plan B = Backup Only use your albuterol (PROVENTIL/yellow) as a rescue medication to be used if you can't catch your breath by resting or doing a relaxed purse lip breathing pattern.  - The less you use it, the better it will work when you need it. - Ok to use the inhaler up to 2 puffs  every 4 hours if you must but call for appointment if use goes up over your usual need - Don't leave home without it !!  (think of it like the spare tire for your car)    The key is to stop smoking completely before smoking completely stops you  - it's not too late!!!    If you are satisfied with your treatment plan,  let your doctor know and he/she can either refill your medications or you can return here when your prescription runs out.      If in any way you are not 100% satisfied,  please tell us.  If 100% better, tell your friends!   Pulmonary follow up is as needed

## 2016-12-30 NOTE — Telephone Encounter (Signed)
Needs to see pcp and start over but in particular needs off acei before we can help him  But certainly nothing to offer over the phone

## 2016-12-30 NOTE — Telephone Encounter (Signed)
Spoke with pt. States that he is having issues with his breathing. Reports increased SOB, chest discomfort/heaviness. Denies coughing, wheezing or chest tightness. SOB is worse with activity and hot temperatures. States that he is taking Symbicort and Albuterol as prescribed with no relief. I offered to make him an appointment with MW to discuss this matter. All of the appointments that I offered him he had an excuse as to why that appointment would not work for him.  MW - please advise. Thanks.  Instructions from 09/27/2016: Stop advair - if breathing no better next step is see Dr Vista Lawman for trial off lisinopril  Plan A = Automatic = symbicort 160 Take 2 puffs first thing in am and then another 2 puffs about 12 hours later.    Work on inhaler technique:  relax and gently blow all the way out then take a nice smooth deep breath back in, triggering the inhaler at same time you start breathing in.  Hold for up to 5 seconds if you can. Blow out thru nose. Rinse and gargle with water when done      Plan B = Backup Only use your albuterol (PROVENTIL/yellow) as a rescue medication to be used if you can't catch your breath by resting or doing a relaxed purse lip breathing pattern.  - The less you use it, the better it will work when you need it. - Ok to use the inhaler up to 2 puffs  every 4 hours if you must but call for appointment if use goes up over your usual need - Don't leave home without it !!  (think of it like the spare tire for your car)    The key is to stop smoking completely before smoking completely stops you  - it's not too late!!!   If you are satisfied with your treatment plan,  let your doctor know and he/she can either refill your medications or you can return here when your prescription runs out.     If in any way you are not 100% satisfied,  please tell us.  If 100% better, tell your friends!  Pulmonary follow up is as needed

## 2016-12-30 NOTE — Telephone Encounter (Signed)
I 'm happy to see him then will need ov with all meds in hand, ok to work in next week and we will check his 62 and all his needs then

## 2016-12-30 NOTE — Telephone Encounter (Signed)
I have spoken with to relay MW's recommendations. Pt states he just had an OV with his PCP yesterday, and was advised to contact our office. Pt also mentioned he would like to be tested for oxygen.   MW please advise. Thanks.

## 2016-12-30 NOTE — Telephone Encounter (Signed)
Spoke with patient. He will see MW on Tuesday 01/04/17 at 315. Advised patient to get here earlier than 315 so that we can work him into the schedule. Also advised him to bring ALL of his medications with him.   Patient verbalized understanding. Nothing further needed.

## 2016-12-31 DIAGNOSIS — N186 End stage renal disease: Secondary | ICD-10-CM | POA: Diagnosis not present

## 2016-12-31 DIAGNOSIS — D509 Iron deficiency anemia, unspecified: Secondary | ICD-10-CM | POA: Diagnosis not present

## 2016-12-31 DIAGNOSIS — D631 Anemia in chronic kidney disease: Secondary | ICD-10-CM | POA: Diagnosis not present

## 2016-12-31 DIAGNOSIS — N2581 Secondary hyperparathyroidism of renal origin: Secondary | ICD-10-CM | POA: Diagnosis not present

## 2016-12-31 NOTE — Telephone Encounter (Signed)
There is another telephone encounter dated 12/30/16. Please see that encounter. This encounter will be closed as this matter was handled in the other message.

## 2017-01-03 DIAGNOSIS — D509 Iron deficiency anemia, unspecified: Secondary | ICD-10-CM | POA: Diagnosis not present

## 2017-01-03 DIAGNOSIS — N186 End stage renal disease: Secondary | ICD-10-CM | POA: Diagnosis not present

## 2017-01-03 DIAGNOSIS — N2581 Secondary hyperparathyroidism of renal origin: Secondary | ICD-10-CM | POA: Diagnosis not present

## 2017-01-03 DIAGNOSIS — D631 Anemia in chronic kidney disease: Secondary | ICD-10-CM | POA: Diagnosis not present

## 2017-01-04 ENCOUNTER — Ambulatory Visit (INDEPENDENT_AMBULATORY_CARE_PROVIDER_SITE_OTHER): Payer: Medicare Other | Admitting: Internal Medicine

## 2017-01-04 ENCOUNTER — Encounter: Payer: Self-pay | Admitting: Internal Medicine

## 2017-01-04 DIAGNOSIS — J449 Chronic obstructive pulmonary disease, unspecified: Secondary | ICD-10-CM

## 2017-01-04 DIAGNOSIS — N6342 Unspecified lump in left breast, subareolar: Secondary | ICD-10-CM | POA: Diagnosis not present

## 2017-01-04 NOTE — Assessment & Plan Note (Signed)
Spirometry 09/27/2016  FEV1 2.20 (70%)  Ratio 64 p am advair - 09/27/2016  After extensive coaching HFA effectiveness =    symbicort 160 2bid    Not able to see pt s accurate medication reconciliation except in an emergency situation, which this is not  F/u prn at Dr Champ Mungo Bonsu's request

## 2017-01-04 NOTE — Progress Notes (Signed)
Subjective:     Patient ID: [name], male   DOB: 1960-08-10,    MRN: [id number]  HPI  46 yobm active smoker on HD x 2011 MWF referred to pulmonary clinic 09/27/2016 by Dr   Vista Lawman for sob    09/27/2016 1st [facility] office visit/ Wilkie Zenon   Chief Complaint  Patient presents with  . Pulmonary Consult    Referred by Dr. Vista Lawman for eval of COPD. Pt states that he has been to the ED x 3 for SOB that occurs at night when he lies down. He states this started back in 2017.    HD am of ov and does feel better in terms of breathing p HD but trips do er do not correlate with HD before vs after. In addition to having paroxyms of resting sob also has doe x MMRC2 = can't walk a nl pace on a flat grade s sob but does fine slow and flat rec Stop advair - if breathing no better next step is see Dr Vista Lawman for trial off lisinopril Plan A = Automatic = symbicort 160 Take 2 puffs first thing in am and then another 2 puffs about 12 hours later.  Work on inhaler technique:  relax and gently blow all the way out then take a nice smooth deep breath back in, triggering the inhaler at same time you start breathing in.  Hold for up to 5 seconds if you can. Blow out thru nose. Rinse and gargle with water when done Plan B = Backup Only use your albuterol (PROVENTIL/yellow)   See phone messages 12/30/16,  Advised to come in for re-eval with all meds in hand   01/04/2017  f/u ov/Myrissa Chipley re: sob/ no meds Chief Complaint  Patient presents with  . Follow-up    OV for oxygen, feels more SOB lately since the heat, wants to stop smoking  angry on arrival / uncooperative with interview and exam related to meds          Objective:   Physical Exam   Refused exam        Assessment:

## 2017-01-04 NOTE — Patient Instructions (Signed)
Pt left before eval complete

## 2017-01-05 DIAGNOSIS — D631 Anemia in chronic kidney disease: Secondary | ICD-10-CM | POA: Diagnosis not present

## 2017-01-05 DIAGNOSIS — D509 Iron deficiency anemia, unspecified: Secondary | ICD-10-CM | POA: Diagnosis not present

## 2017-01-05 DIAGNOSIS — N186 End stage renal disease: Secondary | ICD-10-CM | POA: Diagnosis not present

## 2017-01-05 DIAGNOSIS — N2581 Secondary hyperparathyroidism of renal origin: Secondary | ICD-10-CM | POA: Diagnosis not present

## 2017-01-06 DIAGNOSIS — G8929 Other chronic pain: Secondary | ICD-10-CM | POA: Diagnosis not present

## 2017-01-06 DIAGNOSIS — M79602 Pain in left arm: Secondary | ICD-10-CM | POA: Diagnosis not present

## 2017-01-07 ENCOUNTER — Encounter: Payer: Self-pay | Admitting: *Deleted

## 2017-01-07 DIAGNOSIS — N186 End stage renal disease: Secondary | ICD-10-CM | POA: Diagnosis not present

## 2017-01-07 DIAGNOSIS — D509 Iron deficiency anemia, unspecified: Secondary | ICD-10-CM | POA: Diagnosis not present

## 2017-01-07 DIAGNOSIS — D631 Anemia in chronic kidney disease: Secondary | ICD-10-CM | POA: Diagnosis not present

## 2017-01-07 DIAGNOSIS — N2581 Secondary hyperparathyroidism of renal origin: Secondary | ICD-10-CM | POA: Diagnosis not present

## 2017-01-08 DIAGNOSIS — N186 End stage renal disease: Secondary | ICD-10-CM | POA: Diagnosis not present

## 2017-01-08 DIAGNOSIS — I120 Hypertensive chronic kidney disease with stage 5 chronic kidney disease or end stage renal disease: Secondary | ICD-10-CM | POA: Diagnosis not present

## 2017-01-08 DIAGNOSIS — Z992 Dependence on renal dialysis: Secondary | ICD-10-CM | POA: Diagnosis not present

## 2017-01-10 DIAGNOSIS — N2581 Secondary hyperparathyroidism of renal origin: Secondary | ICD-10-CM | POA: Diagnosis not present

## 2017-01-10 DIAGNOSIS — D631 Anemia in chronic kidney disease: Secondary | ICD-10-CM | POA: Diagnosis not present

## 2017-01-10 DIAGNOSIS — D509 Iron deficiency anemia, unspecified: Secondary | ICD-10-CM | POA: Diagnosis not present

## 2017-01-10 DIAGNOSIS — N186 End stage renal disease: Secondary | ICD-10-CM | POA: Diagnosis not present

## 2017-01-11 ENCOUNTER — Emergency Department (HOSPITAL_COMMUNITY)
Admission: EM | Admit: 2017-01-11 | Discharge: 2017-01-12 | Disposition: A | Discharge status: Home / Self Care | Payer: Medicare Other | Attending: Emergency Medicine | Admitting: Emergency Medicine

## 2017-01-11 ENCOUNTER — Emergency Department (HOSPITAL_COMMUNITY): Payer: Medicare Other

## 2017-01-11 DIAGNOSIS — Z992 Dependence on renal dialysis: Secondary | ICD-10-CM | POA: Diagnosis not present

## 2017-01-11 DIAGNOSIS — R079 Chest pain, unspecified: Secondary | ICD-10-CM | POA: Diagnosis not present

## 2017-01-11 DIAGNOSIS — Z79899 Other long term (current) drug therapy: Secondary | ICD-10-CM | POA: Diagnosis not present

## 2017-01-11 DIAGNOSIS — N186 End stage renal disease: Secondary | ICD-10-CM | POA: Diagnosis not present

## 2017-01-11 DIAGNOSIS — I120 Hypertensive chronic kidney disease with stage 5 chronic kidney disease or end stage renal disease: Secondary | ICD-10-CM | POA: Insufficient documentation

## 2017-01-11 DIAGNOSIS — J449 Chronic obstructive pulmonary disease, unspecified: Secondary | ICD-10-CM | POA: Diagnosis not present

## 2017-01-11 DIAGNOSIS — R0602 Shortness of breath: Secondary | ICD-10-CM | POA: Diagnosis not present

## 2017-01-11 DIAGNOSIS — R519 Headache, unspecified: Secondary | ICD-10-CM

## 2017-01-11 DIAGNOSIS — F17210 Nicotine dependence, cigarettes, uncomplicated: Secondary | ICD-10-CM | POA: Diagnosis not present

## 2017-01-11 DIAGNOSIS — R51 Headache: Secondary | ICD-10-CM | POA: Insufficient documentation

## 2017-01-11 DIAGNOSIS — G4489 Other headache syndrome: Secondary | ICD-10-CM | POA: Diagnosis not present

## 2017-01-11 DIAGNOSIS — I6782 Cerebral ischemia: Secondary | ICD-10-CM | POA: Diagnosis not present

## 2017-01-11 DIAGNOSIS — R072 Precordial pain: Secondary | ICD-10-CM | POA: Diagnosis not present

## 2017-01-11 MED ORDER — OXYCODONE-ACETAMINOPHEN 5-325 MG PO TABS
2.0000 | ORAL_TABLET | Freq: Once | ORAL | Status: AC
  Administered 2017-01-11: 2 via ORAL
  Filled 2017-01-11: qty 2

## 2017-01-11 NOTE — ED Triage Notes (Signed)
Pt from home via [facility]. In w/ c/o L sided headache, SHOB & central CP x3 days. Went to dialysis yesterday with no issues. 324 ASA given by EMS PTA. Doesn't know if he has any heart hx. Dialysis pt MWF. Ambulatory on scene.  A&O x4.

## 2017-01-11 NOTE — ED Provider Notes (Signed)
[name] DEPT Provider Note   CSN: [id number] Arrival date & time: 01/11/17  2209  By signing my name below, I, [name], attest that this documentation has been prepared under the direction and in the presence of Veryl Speak, MD. Electronically Signed: [name], ED Scribe. 01/11/17. 11:11 PM.  History   Chief Complaint Chief Complaint  Patient presents with  . Headache  . Chest Pain   The history is provided by the patient. No language interpreter was used.  Headache   This is a new problem. The current episode started 2 days ago. The problem occurs constantly. The pain is located in the left unilateral region. The pain is moderate. Radiates to: left jaw. Associated symptoms include shortness of breath (resolved by oxygen) and vomiting. Pertinent negatives include no fever.  Chest Pain   This is a new problem. The current episode started 2 days ago. The problem has not changed since onset.The pain is present in the substernal region. The pain is mild. The pain does not radiate. Associated symptoms include headaches, shortness of breath (resolved by oxygen) and vomiting. Pertinent negatives include no dizziness and no fever. Treatments tried: ASA per EMS.  His past medical history is significant for COPD and hypertension.   HPI Comments: [name] is a 56 y.o. male with a PMHx of HTN, who presents to the Emergency Department via EMS complaining of a constant left-sided headache onset 2 days ago. Pt states symptoms began after eating at a restaurant. No hx of similar symptoms. No injury or trauma. No blood thinner use at home. Pt went to dialysis yesterday and states he left 20 minutes early. Per pt, he has been on dialysis for 7 years.   Pt has a second complaint of central CP onset 2 days ago. No hx of heart surgery or procedure. Sees Dr. Gardiner Fanti for his heart.    Past Medical History:  Diagnosis Date  . Adenomatous colon polyp    tubular  . Anemia   .  Anxiety   . Arthritis    left shoulder  . Atherosclerosis of aorta ([place])   . Cardiomegaly   . Chest pain    DATE UNKNOWN, C/O PERIODICALLY  . Chronic kidney disease    dialysis - M/W/F  . Cocaine abuse   . COPD exacerbation ([place]) 08/17/2016  . Dialysis patient ([place])    Monday-Wednesday-Friday  . GERD (gastroesophageal reflux disease)    DATE UNKNOWN  . Hemorrhoids   . Hepatitis B, chronic ([place])   . Hepatitis C   . Hyperkalemia   . Hypertension   . Metabolic bone disease    Patient denies  . Nephrolithiasis   . Pneumonia   . Pulmonary edema   . Renal disorder   . Renal insufficiency   . Shortness of breath dyspnea    " for the last past year with this dialysis"  . Tubular adenoma of colon     Patient Active Problem List   Diagnosis Date Noted  . COPD GOLD II/ still smoking 09/27/2016  . Essential hypertension 09/27/2016  . Fluid overload 08/30/2016  . Acute respiratory failure with hypoxia ([place]) 08/17/2016  . COPD exacerbation ([place]) 08/17/2016  . Hypertensive urgency 08/17/2016  . Acute bronchitis 08/17/2016  . Elevated troponin 08/17/2016  . Respiratory failure ([place]) 08/17/2016  . Problem with dialysis access ([place]) 07/23/2016  . End stage renal disease ([place]) 12/02/2015  . Chronic hepatitis B ([place]) 03/05/2014  . Chronic hepatitis C without hepatic coma ([facility])  03/05/2014  . Internal hemorrhoids with bleeding, swelling and itching 03/05/2014  . Thrombocytopenia ([place]) 03/05/2014  . Chest pain 02/27/2014  . Alcohol abuse 04/14/2009  . Cigarette smoker 04/14/2009  . GANGLION CYST 04/14/2009    Past Surgical History:  Procedure Laterality Date  . AV FISTULA PLACEMENT  2012   BELIEVED WAS PLACED IN JUNE  . COLONOSCOPY    . HEMORRHOID BANDING    . LIGATION OF ARTERIOVENOUS  FISTULA Left 11/11/9765   Procedure: Plication of Left Arm Arteriovenous Fistula;  Surgeon: Elam Dutch, MD;  Location: [facility];  Service: Vascular;  Laterality: Left;  . POLYPECTOMY    .  REVISON OF ARTERIOVENOUS FISTULA Left 3/41/9379   Procedure: PLICATION OF DISTAL ANEURYSMAL SEGEMENT OF LEFT UPPER ARM ARTERIOVENOUS FISTULA;  Surgeon: Elam Dutch, MD;  Location: [facility];  Service: Vascular;  Laterality: Left;  . REVISON OF ARTERIOVENOUS FISTULA Left 0/24/0973   Procedure: Plication of Left Upper Arm Fistula ;  Surgeon: Waynetta Sandy, MD;  Location[facility];  Service: Vascular;  Laterality: Left;       Home Medications    Prior to Admission medications   Medication Sig Start Date End Date Taking? Authorizing Provider  minoxidil (LONITEN) 10 MG tablet Take 10 mg by mouth daily.    [provider]    Family History Family History  Problem Relation Age of Onset  . Heart disease Mother   . Lung cancer Mother   . Heart disease Father   . Malignant hyperthermia Father   . Throat cancer Sister   . Esophageal cancer Sister   . Hypertension Other   . COPD Other   . Colon cancer Neg Hx   . Colon polyps Neg Hx   . Rectal cancer Neg Hx   . Stomach cancer Neg Hx     Social History Social History  Substance Use Topics  . Smoking status: Current Every Day Smoker    Packs/day: 1.00    Years: 43.00    Types: Cigarettes  . Smokeless tobacco: Never Used     Comment: 2-3 cigarettes per day  . Alcohol use 2.4 oz/week    1 Glasses of wine, 1 Cans of beer, 1 Shots of liquor, 1 Standard drinks or equivalent per week     Comment: occasional      Allergies   Aspirin; Clonidine derivatives; and Tramadol   Review of Systems Review of Systems  Constitutional: Negative for fever.  Eyes: Negative for visual disturbance.  Respiratory: Positive for shortness of breath (resolved by oxygen).   Cardiovascular: Positive for chest pain.  Gastrointestinal: Positive for vomiting.  Neurological: Positive for headaches. Negative for dizziness.  All other systems reviewed and are negative.    Physical Exam Updated Vital Signs BP (!) 144/65 (BP Location:  Right Arm)   Pulse 69   Temp 97.6 F (36.4 C) (Oral)   Resp 16   SpO2 99%   Physical Exam  Constitutional: He is oriented to person, place, and time. He appears well-developed and well-nourished. No distress.  HENT:  Head: Normocephalic and atraumatic.  Right Ear: Hearing normal.  Left Ear: Hearing normal.  Nose: Nose normal.  Mouth/Throat: Oropharynx is clear and moist and mucous membranes are normal.  Eyes: Conjunctivae and EOM are normal. Pupils are equal, round, and reactive to light.  Neck: Normal range of motion. Neck supple.  Cardiovascular: Normal rate, S1 normal, S2 normal and normal heart sounds.  Exam reveals no gallop and no friction  rub.   No murmur heard. Irregularly irregular   Pulmonary/Chest: Effort normal and breath sounds normal. No respiratory distress. He exhibits no tenderness.  Abdominal: Soft. Normal appearance and bowel sounds are normal. There is no hepatosplenomegaly. There is no tenderness. There is no rebound, no guarding, no tenderness at McBurney's point and negative Murphy's sign. No hernia.  Musculoskeletal: Normal range of motion.  Neurological: He is alert and oriented to person, place, and time. He has normal strength. No cranial nerve deficit or sensory deficit. He exhibits normal muscle tone. Coordination normal. GCS eye subscore is 4. GCS verbal subscore is 5. GCS motor subscore is 6.  Skin: Skin is warm, dry and intact. No rash noted. No cyanosis.  Psychiatric: He has a normal mood and affect. His speech is normal and behavior is normal. Thought content normal.  Nursing note and vitals reviewed.    ED Treatments / Results  DIAGNOSTIC STUDIES: Oxygen Saturation is 99% on [place], normal by my interpretation.   COORDINATION OF CARE: 11:07 PM-Discussed next steps with pt including a CT, chest XR and labwork. Pt verbalized understanding and is agreeable with the plan.   Labs (all labs ordered are listed, but only abnormal results are  displayed) Labs Reviewed  COMPREHENSIVE METABOLIC PANEL  CBC WITH DIFFERENTIAL/PLATELET  LIPASE, BLOOD  I-STAT TROPOININ, ED    EKG  EKG Interpretation None       Radiology No results found.  Procedures Procedures (including critical care time)  Medications Ordered in ED Medications  oxyCODONE-acetaminophen (PERCOCET/ROXICET) 5-325 MG per tablet 2 tablet (not administered)     Initial Impression / Assessment and Plan / ED Course  I have reviewed the triage vital signs and the nursing notes.  Pertinent labs & imaging results that were available during my care of the patient were reviewed by me and considered in my medical decision making (see chart for details).  [name] with history of end-stage renal disease on hemodialysis, hepatitis C presenting for evaluation of headache and chest pain. He states this started after eating at a [employer]. He denies any vomiting or diarrhea. He denies any fevers or chills.  His laboratory studies are reassuring, and CT scan of the head is unremarkable. He was given medicine for his headache and appears to be feeling better. I see no indication for further workup at this time. He will be discharged, to follow-up with his dialysis as scheduled and return to the ER if symptoms worsen or change.  Final Clinical Impressions(s) / ED Diagnoses   Final diagnoses:  None    New Prescriptions New Prescriptions   No medications on file   I personally performed the services described in this documentation, which was scribed in my presence. The recorded information has been reviewed and is accurate.        Veryl Speak, MD 01/12/17 [phone]

## 2017-01-12 ENCOUNTER — Emergency Department (HOSPITAL_COMMUNITY): Payer: Medicare Other

## 2017-01-12 DIAGNOSIS — D509 Iron deficiency anemia, unspecified: Secondary | ICD-10-CM | POA: Diagnosis not present

## 2017-01-12 DIAGNOSIS — N186 End stage renal disease: Secondary | ICD-10-CM | POA: Diagnosis not present

## 2017-01-12 DIAGNOSIS — R51 Headache: Secondary | ICD-10-CM | POA: Diagnosis not present

## 2017-01-12 DIAGNOSIS — D631 Anemia in chronic kidney disease: Secondary | ICD-10-CM | POA: Diagnosis not present

## 2017-01-12 DIAGNOSIS — N2581 Secondary hyperparathyroidism of renal origin: Secondary | ICD-10-CM | POA: Diagnosis not present

## 2017-01-12 DIAGNOSIS — R079 Chest pain, unspecified: Secondary | ICD-10-CM | POA: Diagnosis not present

## 2017-01-12 LAB — CBC WITH DIFFERENTIAL/PLATELET
Basophils Absolute: 0 10*3/uL (ref 0.0–0.1)
Basophils Relative: 0 %
Eosinophils Absolute: 0.1 10*3/uL (ref 0.0–0.7)
Eosinophils Relative: 2 %
HCT: 29.5 % — ABNORMAL LOW (ref 39.0–52.0)
Hemoglobin: 10.1 g/dL — ABNORMAL LOW (ref 13.0–17.0)
Lymphocytes Relative: 32 %
Lymphs Abs: 2.4 10*3/uL (ref 0.7–4.0)
MCH: 28.6 pg (ref 26.0–34.0)
MCHC: 34.2 g/dL (ref 30.0–36.0)
MCV: 83.6 fL (ref 78.0–100.0)
Monocytes Absolute: 0.3 10*3/uL (ref 0.1–1.0)
Monocytes Relative: 5 %
Neutro Abs: 4.4 10*3/uL (ref 1.7–7.7)
Neutrophils Relative %: 61 %
Platelets: 71 10*3/uL — ABNORMAL LOW (ref 150–400)
RBC: 3.53 MIL/uL — ABNORMAL LOW (ref 4.22–5.81)
RDW: 18.1 % — ABNORMAL HIGH (ref 11.5–15.5)
WBC: 7.3 10*3/uL (ref 4.0–10.5)

## 2017-01-12 LAB — COMPREHENSIVE METABOLIC PANEL
ALT: 12 U/L — ABNORMAL LOW (ref 17–63)
AST: 18 U/L (ref 15–41)
Albumin: 3.9 g/dL (ref 3.5–5.0)
Alkaline Phosphatase: 134 U/L — ABNORMAL HIGH (ref 38–126)
Anion gap: 15 (ref 5–15)
BUN: 31 mg/dL — ABNORMAL HIGH (ref 6–20)
CO2: 23 mmol/L (ref 22–32)
Calcium: 8.8 mg/dL — ABNORMAL LOW (ref 8.9–10.3)
Chloride: 89 mmol/L — ABNORMAL LOW (ref 101–111)
Creatinine, Ser: 8.99 mg/dL — ABNORMAL HIGH (ref 0.61–1.24)
GFR calc Af Amer: 7 mL/min — ABNORMAL LOW (ref >60)
GFR calc non Af Amer: 6 mL/min — ABNORMAL LOW (ref >60)
Glucose, Bld: 99 mg/dL (ref 65–99)
Potassium: 4.4 mmol/L (ref 3.5–5.1)
Sodium: 127 mmol/L — ABNORMAL LOW (ref 135–145)
Total Bilirubin: 0.6 mg/dL (ref 0.3–1.2)
Total Protein: 7.2 g/dL (ref 6.5–8.1)

## 2017-01-12 LAB — I-STAT TROPONIN, ED: Troponin i, poc: 0.06 ng/mL (ref 0.00–0.08)

## 2017-01-12 LAB — LIPASE, BLOOD: Lipase: 30 U/L (ref 11–51)

## 2017-01-12 NOTE — Discharge Instructions (Signed)
Continue your medications as previously prescribed and go to your dialysis session as scheduled.  Follow-up with your primary Dr. if symptoms are not improving in the next week. Return to the ER if symptoms significantly worsen or change.

## 2017-01-13 ENCOUNTER — Encounter: Payer: Self-pay | Admitting: Internal Medicine

## 2017-01-13 ENCOUNTER — Telehealth: Payer: Self-pay | Admitting: Internal Medicine

## 2017-01-13 ENCOUNTER — Ambulatory Visit (INDEPENDENT_AMBULATORY_CARE_PROVIDER_SITE_OTHER): Payer: Medicare Other | Admitting: Internal Medicine

## 2017-01-13 VITALS — BP 120/82 | HR 78 | Ht 69.0 in | Wt 161.0 lb

## 2017-01-13 DIAGNOSIS — K648 Other hemorrhoids: Secondary | ICD-10-CM | POA: Diagnosis not present

## 2017-01-13 MED ORDER — POLYETHYLENE GLYCOL 3350 17 GM/SCOOP PO POWD: ORAL | 1 refills | Status: DC

## 2017-01-13 MED ORDER — MAGNESIUM CITRATE PO SOLN: 296.0000 mL | Freq: Once | ORAL | 1 refills | Status: AC

## 2017-01-13 NOTE — Telephone Encounter (Signed)
Fine with me but no reason to return s all medications in hand as we can't provide quality care without full accurate med reconciliation (this was the issue he was angry about at last ov)   If he gets an attitude about complying with this request, needs pcp to refer to med center

## 2017-01-13 NOTE — Telephone Encounter (Signed)
MW pt is wanting to change providers.  Please advise if you are ok with this change. Thanks

## 2017-01-13 NOTE — Patient Instructions (Addendum)
If you are age 54 or older, your body mass index should be between 23-30. Your Body mass index is 23.78 kg/m. If this is out of the aforementioned range listed, please consider follow up with your Primary Care Provider.  If you are age 74 or younger, your body mass index should be between 19-25. Your Body mass index is 23.78 kg/m. If this is out of the aformentioned range listed, please consider follow up with your Primary Care Provider.  Marland KitchenHEMORRHOID BANDING PROCEDURE    FOLLOW-UP CARE   1. The procedure you have had should have been relatively painless since the banding of the area involved does not have nerve endings and there is no pain sensation.  The rubber band cuts off the blood supply to the hemorrhoid and the band may fall off as soon as 48 hours after the banding (the band may occasionally be seen in the toilet bowl following a bowel movement). You may notice a temporary feeling of fullness in the rectum which should respond adequately to plain Tylenol or Motrin.  2. Following the banding, avoid strenuous exercise that evening and resume full activity the next day.  A sitz bath (soaking in a warm tub) or bidet is soothing, and can be useful for cleansing the area after bowel movements.     3. To avoid constipation, take two tablespoons of natural wheat bran, natural oat bran, flax, Benefiber or any over the counter fiber supplement and increase your water intake to 7-8 glasses daily.    4. Unless you have been prescribed anorectal medication, do not put anything inside your rectum for two weeks: No suppositories, enemas, fingers, etc.  5. Occasionally, you may have more bleeding than usual after the banding procedure.  This is often from the untreated hemorrhoids rather than the treated one.  Don't be concerned if there is a tablespoon or so of blood.  If there is more blood than this, lie flat with your bottom higher than your head and apply an ice pack to the area. If the bleeding  does not stop within a half an hour or if you feel faint, call our office at [phone] or go to the emergency room.  6. Problems are not common; however, if there is a substantial amount of bleeding, severe pain, chills, fever or difficulty passing urine (very rare) or other problems, you should call us at [phone] or report to the nearest emergency room.  7. Do not stay seated continuously for more than 2-3 hours for a day or two after the procedure.  Tighten your buttock muscles 10-15 times every two hours and take 10-15 deep breaths every 1-2 hours.  Do not spend more than a few minutes on the toilet if you cannot empty your bowel; instead re-visit the toilet at a later time.   We have you scheduled for your 2nd banding appointment on July 27th at 3:45pm.   Take Miralax daily (1 capful)  Take Magnesium Citrate as pre bottle instructions.

## 2017-01-13 NOTE — Progress Notes (Signed)
[name] is a 56 yo history of viral hepatitis B and C status post antiviral therapy with sustained virologic response, ESRD on HD, history of colon polyps and symptomatic internal hemorrhoids  Patient had left lateral internal hemorrhoid banded with Dr. Carlean Purl in 2015 but further banding's were put on hold while his liver disease was further worked up  He returns today for further banding.  Symptoms are predominantly bleeding and prolapse. He does continue to have issues with constipation  PROCEDURE NOTE:  The patient presents with symptomatic grade 2 internal hemorrhoids, requesting rubber band ligation of his hemorrhoidal disease.  All risks, benefits and alternative forms of therapy were described and informed consent was obtained.   The anorectum was pre-medicated with 0.125% nitroglycerin ointment The decision was made to band the LL (RA banded in 2015) internal hemorrhoid, and the [place] was used to perform band ligation without complication.   Digital anorectal examination was then performed to assure proper positioning of the band, and to adjust the banded tissue as required.  The patient was discharged home without pain or other issues.  Dietary and behavioral recommendations were given and along with follow-up instructions.    Also use MiraLax 17 g daily on consistent basis Can use Mg Citrate x 1 today given moderate constipation  The patient will return as scheduled for follow-up and possible additional banding as required. No complications were encountered and the patient tolerated the procedure well.

## 2017-01-13 NOTE — Telephone Encounter (Signed)
JN please advise if you're willing to take on this patient.  Thanks!

## 2017-01-14 ENCOUNTER — Other Ambulatory Visit: Payer: Self-pay

## 2017-01-14 ENCOUNTER — Emergency Department (HOSPITAL_COMMUNITY): Payer: Medicare Other

## 2017-01-14 ENCOUNTER — Encounter (HOSPITAL_COMMUNITY): Payer: Self-pay | Admitting: Emergency Medicine

## 2017-01-14 ENCOUNTER — Emergency Department (HOSPITAL_COMMUNITY)
Admission: EM | Admit: 2017-01-14 | Discharge: 2017-01-14 | Disposition: A | Discharge status: Home / Self Care | Payer: Medicare Other | Attending: Emergency Medicine | Admitting: Emergency Medicine

## 2017-01-14 DIAGNOSIS — R079 Chest pain, unspecified: Secondary | ICD-10-CM | POA: Diagnosis not present

## 2017-01-14 DIAGNOSIS — J449 Chronic obstructive pulmonary disease, unspecified: Secondary | ICD-10-CM | POA: Insufficient documentation

## 2017-01-14 DIAGNOSIS — R0789 Other chest pain: Secondary | ICD-10-CM

## 2017-01-14 DIAGNOSIS — Z79899 Other long term (current) drug therapy: Secondary | ICD-10-CM | POA: Insufficient documentation

## 2017-01-14 DIAGNOSIS — N185 Chronic kidney disease, stage 5: Secondary | ICD-10-CM | POA: Diagnosis not present

## 2017-01-14 DIAGNOSIS — I120 Hypertensive chronic kidney disease with stage 5 chronic kidney disease or end stage renal disease: Secondary | ICD-10-CM | POA: Insufficient documentation

## 2017-01-14 DIAGNOSIS — D509 Iron deficiency anemia, unspecified: Secondary | ICD-10-CM | POA: Diagnosis not present

## 2017-01-14 DIAGNOSIS — F17210 Nicotine dependence, cigarettes, uncomplicated: Secondary | ICD-10-CM | POA: Insufficient documentation

## 2017-01-14 DIAGNOSIS — D631 Anemia in chronic kidney disease: Secondary | ICD-10-CM | POA: Diagnosis not present

## 2017-01-14 DIAGNOSIS — D649 Anemia, unspecified: Secondary | ICD-10-CM | POA: Diagnosis not present

## 2017-01-14 DIAGNOSIS — N2581 Secondary hyperparathyroidism of renal origin: Secondary | ICD-10-CM | POA: Diagnosis not present

## 2017-01-14 DIAGNOSIS — N186 End stage renal disease: Secondary | ICD-10-CM | POA: Diagnosis not present

## 2017-01-14 DIAGNOSIS — Z992 Dependence on renal dialysis: Secondary | ICD-10-CM | POA: Insufficient documentation

## 2017-01-14 LAB — CBC
HCT: 29.7 % — ABNORMAL LOW (ref 39.0–52.0)
Hemoglobin: 10 g/dL — ABNORMAL LOW (ref 13.0–17.0)
MCH: 29 pg (ref 26.0–34.0)
MCHC: 33.7 g/dL (ref 30.0–36.0)
MCV: 86.1 fL (ref 78.0–100.0)
Platelets: 83 10*3/uL — ABNORMAL LOW (ref 150–400)
RBC: 3.45 MIL/uL — ABNORMAL LOW (ref 4.22–5.81)
RDW: 18.2 % — ABNORMAL HIGH (ref 11.5–15.5)
WBC: 7.3 10*3/uL (ref 4.0–10.5)

## 2017-01-14 LAB — BASIC METABOLIC PANEL
Anion gap: 14 (ref 5–15)
BUN: 16 mg/dL (ref 6–20)
CO2: 29 mmol/L (ref 22–32)
Calcium: 8.4 mg/dL — ABNORMAL LOW (ref 8.9–10.3)
Chloride: 92 mmol/L — ABNORMAL LOW (ref 101–111)
Creatinine, Ser: 5.33 mg/dL — ABNORMAL HIGH (ref 0.61–1.24)
GFR calc Af Amer: 13 mL/min — ABNORMAL LOW (ref >60)
GFR calc non Af Amer: 11 mL/min — ABNORMAL LOW (ref >60)
Glucose, Bld: 130 mg/dL — ABNORMAL HIGH (ref 65–99)
Potassium: 3.7 mmol/L (ref 3.5–5.1)
Sodium: 135 mmol/L (ref 135–145)

## 2017-01-14 LAB — HEPATIC FUNCTION PANEL
ALT: 13 U/L — ABNORMAL LOW (ref 17–63)
AST: 23 U/L (ref 15–41)
Albumin: 4.1 g/dL (ref 3.5–5.0)
Alkaline Phosphatase: 165 U/L — ABNORMAL HIGH (ref 38–126)
Bilirubin, Direct: 0.2 mg/dL (ref 0.1–0.5)
Indirect Bilirubin: 0.4 mg/dL (ref 0.3–0.9)
Total Bilirubin: 0.6 mg/dL (ref 0.3–1.2)
Total Protein: 8 g/dL (ref 6.5–8.1)

## 2017-01-14 LAB — I-STAT TROPONIN, ED: Troponin i, poc: 0.06 ng/mL (ref 0.00–0.08)

## 2017-01-14 MED ORDER — ACETAMINOPHEN 500 MG PO TABS
1000.0000 mg | ORAL_TABLET | Freq: Once | ORAL | Status: AC
  Administered 2017-01-14: 1000 mg via ORAL
  Filled 2017-01-14: qty 2

## 2017-01-14 MED ORDER — OXYCODONE-ACETAMINOPHEN 5-325 MG PO TABS: 1.0000 | ORAL_TABLET | Freq: Four times a day (QID) | ORAL | 0 refills | Status: DC | PRN

## 2017-01-14 MED ORDER — HYDROCODONE-ACETAMINOPHEN 5-325 MG PO TABS: 1.0000 | ORAL_TABLET | Freq: Four times a day (QID) | ORAL | 0 refills | Status: DC | PRN

## 2017-01-14 NOTE — Telephone Encounter (Signed)
Called and  Spoke with pt and he is aware of appt with JN on 8-30 at 1130.  Nothing further is needed.

## 2017-01-14 NOTE — ED Notes (Signed)
Pt requested some oxygen for feeling short of breath. Pt 100% on room air. Pt placed on 1.5L [place].

## 2017-01-14 NOTE — ED Provider Notes (Signed)
[facility] DEPT Provider Note   CSN: [id number] Arrival date & time: 01/14/17  1931     History   Chief Complaint Chief Complaint  Patient presents with  . Chest Pain    HPI [name] is a 56 y.o. male.  Patient states that he's been having periodic left-sided chest pain worse with movement. This is been going on for a number of weeks.   The history is provided by the patient.  Chest Pain   This is a recurrent problem. The current episode started more than 1 week ago. The problem occurs daily. The problem has not changed since onset.The pain is associated with movement. The pain is present in the substernal region. The pain is at a severity of 4/10. The pain is moderate. Pertinent negatives include no abdominal pain, no back pain, no cough and no headaches.  Pertinent negatives for past medical history include no seizures.    Past Medical History:  Diagnosis Date  . Adenomatous colon polyp    tubular  . Anemia   . Anxiety   . Arthritis    left shoulder  . Atherosclerosis of aorta ([place])   . Cardiomegaly   . Chest pain    DATE UNKNOWN, C/O PERIODICALLY  . Chronic kidney disease    dialysis - M/W/F  . Cocaine abuse   . COPD exacerbation ([place]) 08/17/2016  . Dialysis patient ([place])    Monday-Wednesday-Friday  . GERD (gastroesophageal reflux disease)    DATE UNKNOWN  . Hemorrhoids   . Hepatitis B, chronic ([place])   . Hepatitis C   . Hyperkalemia   . Hypertension   . Metabolic bone disease    Patient denies  . Nephrolithiasis   . Pneumonia   . Pulmonary edema   . Renal disorder   . Renal insufficiency   . Shortness of breath dyspnea    " for the last past year with this dialysis"  . Tubular adenoma of colon     Patient Active Problem List   Diagnosis Date Noted  . COPD GOLD II/ still smoking 09/27/2016  . Essential hypertension 09/27/2016  . Fluid overload 08/30/2016  . Acute respiratory failure with hypoxia ([place]) 08/17/2016  . COPD exacerbation  ([place]) 08/17/2016  . Hypertensive urgency 08/17/2016  . Acute bronchitis 08/17/2016  . Elevated troponin 08/17/2016  . Respiratory failure ([place]) 08/17/2016  . Problem with dialysis access ([place]) 07/23/2016  . End stage renal disease ([place]) 12/02/2015  . Chronic hepatitis B ([place]) 03/05/2014  . Chronic hepatitis C without hepatic coma ([place]) 03/05/2014  . Internal hemorrhoids with bleeding, swelling and itching 03/05/2014  . Thrombocytopenia ([place]) 03/05/2014  . Chest pain 02/27/2014  . Alcohol abuse 04/14/2009  . Cigarette smoker 04/14/2009  . GANGLION CYST 04/14/2009    Past Surgical History:  Procedure Laterality Date  . AV FISTULA PLACEMENT  2012   BELIEVED WAS PLACED IN JUNE  . COLONOSCOPY    . HEMORRHOID BANDING    . LIGATION OF ARTERIOVENOUS  FISTULA Left 01/15/7340   Procedure: Plication of Left Arm Arteriovenous Fistula;  Surgeon: Elam Dutch, MD;  Location: [facility];  Service: Vascular;  Laterality: Left;  . POLYPECTOMY    . REVISON OF ARTERIOVENOUS FISTULA Left 9/37/9024   Procedure: PLICATION OF DISTAL ANEURYSMAL SEGEMENT OF LEFT UPPER ARM ARTERIOVENOUS FISTULA;  Surgeon: Elam Dutch, MD;  Location: [facility];  Service: Vascular;  Laterality: Left;  . REVISON OF ARTERIOVENOUS FISTULA Left 0/97/3532   Procedure: Plication of Left Upper  Arm Fistula ;  Surgeon: Waynetta Sandy, MD;  Location: [facility] OR;  Service: Vascular;  Laterality: Left;       Home Medications    Prior to Admission medications   Medication Sig Start Date End Date Taking? Authorizing Provider  acetaminophen (TYLENOL) 500 MG tablet Take 500-1,000 mg by mouth every 6 (six) hours as needed (for headaches and pain).   Yes [provider]  albuterol (PROVENTIL HFA;VENTOLIN HFA) 108 (90 Base) MCG/ACT inhaler Inhale 2 puffs into the lungs every 6 (six) hours as needed for wheezing or shortness of breath.    Yes [provider]  budesonide-formoterol (SYMBICORT) 160-4.5 MCG/ACT inhaler  Inhale 2 puffs into the lungs 2 (two) times daily.   Yes [provider]  gabapentin (NEURONTIN) 100 MG capsule Take 100 mg by mouth up to two times a day 01/06/17  Yes [provider]  minoxidil (LONITEN) 10 MG tablet Take 10 mg by mouth daily.   Yes [provider]  polyethylene glycol powder (GLYCOLAX/MIRALAX) powder Take 17grams (1 capful) dissolved in at least 8 ounces of water or juice, once daily 01/13/17  Yes Pyrtle, Lajuan Lines, MD  sevelamer carbonate (RENVELA) 800 MG tablet Take 2,400 mg by mouth 3 (three) times daily with meals.   Yes [provider]  HYDROcodone-acetaminophen (NORCO/VICODIN) 5-325 MG tablet Take 1 tablet by mouth every 6 (six) hours as needed for moderate pain. 01/14/17   Milton Ferguson, MD    Family History Family History  Problem Relation Age of Onset  . Heart disease Mother   . Lung cancer Mother   . Heart disease Father   . Malignant hyperthermia Father   . Throat cancer Sister   . Esophageal cancer Sister   . Hypertension Other   . COPD Other   . Colon cancer Neg Hx   . Colon polyps Neg Hx   . Rectal cancer Neg Hx   . Stomach cancer Neg Hx     Social History Social History  Substance Use Topics  . Smoking status: Current Every Day Smoker    Packs/day: 1.00    Years: 43.00    Types: Cigarettes  . Smokeless tobacco: Never Used     Comment: 2-3 cigarettes per day  . Alcohol use 2.4 oz/week    1 Glasses of wine, 1 Cans of beer, 1 Shots of liquor, 1 Standard drinks or equivalent per week     Comment: occasional      Allergies   Aspirin; Clonidine derivatives; and Tramadol   Review of Systems Review of Systems  Constitutional: Negative for appetite change and fatigue.  HENT: Negative for congestion, ear discharge and sinus pressure.   Eyes: Negative for discharge.  Respiratory: Negative for cough.   Cardiovascular: Positive for chest pain.  Gastrointestinal: Negative for abdominal pain and diarrhea.    Genitourinary: Negative for frequency and hematuria.  Musculoskeletal: Negative for back pain.  Skin: Negative for rash.  Neurological: Negative for seizures and headaches.  Psychiatric/Behavioral: Negative for hallucinations.     Physical Exam Updated Vital Signs BP (!) 121/57   Pulse 77   Temp 97.8 F (36.6 C)   Resp (!) 27   Ht 5\' 9"  (1.753 m)   Wt 73 kg (161 lb)   SpO2 99%   BMI 23.78 kg/m   Physical Exam  Constitutional: He is oriented to person, place, and time. He appears well-developed.  HENT:  Head: Normocephalic.  Eyes: Conjunctivae and EOM are normal. No scleral icterus.  Neck: Neck supple. No thyromegaly present.  Cardiovascular: Normal rate and regular rhythm.  Exam reveals no gallop and no friction rub.   No murmur heard. Pulmonary/Chest: No stridor. He has no wheezes. He has no rales. He exhibits tenderness.  Abdominal: He exhibits no distension. There is no tenderness. There is no rebound.  Musculoskeletal: Normal range of motion. He exhibits no edema.  Lymphadenopathy:    He has no cervical adenopathy.  Neurological: He is oriented to person, place, and time. He exhibits normal muscle tone. Coordination normal.  Skin: No rash noted. No erythema.  Psychiatric: He has a normal mood and affect. His behavior is normal.     ED Treatments / Results  Labs (all labs ordered are listed, but only abnormal results are displayed) Labs Reviewed  BASIC METABOLIC PANEL - Abnormal; Notable for the following:       Result Value   Chloride 92 (*)    Glucose, Bld 130 (*)    Creatinine, Ser 5.33 (*)    Calcium 8.4 (*)    GFR calc non Af Amer 11 (*)    GFR calc Af Amer 13 (*)    All other components within normal limits  CBC - Abnormal; Notable for the following:    RBC 3.45 (*)    Hemoglobin 10.0 (*)    HCT 29.7 (*)    RDW 18.2 (*)    Platelets 83 (*)    All other components within normal limits  HEPATIC FUNCTION PANEL - Abnormal; Notable for the following:     ALT 13 (*)    Alkaline Phosphatase 165 (*)    All other components within normal limits  I-STAT TROPOININ, ED    EKG  EKG Interpretation  Date/Time:  Friday January 14 2017 19:27:02 EDT Ventricular Rate:  71 PR Interval:    QRS Duration: 168 QT Interval:  514 QTC Calculation: 558 R Axis:   -57 Text Interpretation:  Atrial fibrillation Left axis deviation Left bundle branch block Abnormal ECG Confirmed by Milton Ferguson [phone]) on 01/14/2017 8:04:20 PM       Radiology Dg Chest 2 View  Result Date: 01/14/2017 CLINICAL DATA:  Chest pain EXAM: CHEST  2 VIEW COMPARISON:  January 12, 2017 FINDINGS: There is stable mild left base atelectasis. There is mild elevation of the left hemidiaphragm, stable. There is no edema or consolidation. Heart is mildly enlarged with pulmonary vascularity within normal limits. There is aortic atherosclerosis. No adenopathy. No bone lesions. No pneumothorax. IMPRESSION: Stable mild left base atelectasis. No edema or consolidation. Stable cardiac enlargement. There is aortic atherosclerosis. Aortic Atherosclerosis (ICD10-I70.0). Electronically Signed   By: Lowella Grip III M.D.   On: 01/14/2017 20:32    Procedures Procedures (including critical care time)  Medications Ordered in ED Medications  acetaminophen (TYLENOL) tablet 1,000 mg (1,000 mg Oral Given 01/14/17 2155)     Initial Impression / Assessment and Plan / ED Course  I have reviewed the triage vital signs and the nursing notes.  Pertinent labs & imaging results that were available during my care of the patient were reviewed by me and considered in my medical decision making (see chart for details).    Troponin unremarkable. Suspect chest wall pain. Patient will be given some Vicodin and will follow-up with his PCP  Final Clinical Impressions(s) / ED Diagnoses   Final diagnoses:  Chest wall pain    New Prescriptions New Prescriptions   HYDROCODONE-ACETAMINOPHEN (NORCO/VICODIN) 5-325 MG  TABLET    Take 1  tablet by mouth every 6 (six) hours as needed for moderate pain.     Milton Ferguson, MD 01/14/17 2310

## 2017-01-14 NOTE — Discharge Instructions (Signed)
Follow-up with your family doctor next week for recheck. 

## 2017-01-14 NOTE — ED Triage Notes (Addendum)
Pt transported from home with c/o L sided CP x months, +orthopnea, HA after nitro x 1. ASA x 4 given, IV R AC, L sided restricted d/t HD (M,W,F), full tx today. Pain 10/10 after nitro  Pt c/o midsternal CP radiating to L side, pt states PCP states recurrent CP is related to nerve damage. Pt reports shob with CP. A & O, very talkative in triage.

## 2017-01-14 NOTE — Telephone Encounter (Signed)
Fine with me. J.

## 2017-01-17 DIAGNOSIS — N2581 Secondary hyperparathyroidism of renal origin: Secondary | ICD-10-CM | POA: Diagnosis not present

## 2017-01-17 DIAGNOSIS — D509 Iron deficiency anemia, unspecified: Secondary | ICD-10-CM | POA: Diagnosis not present

## 2017-01-17 DIAGNOSIS — D631 Anemia in chronic kidney disease: Secondary | ICD-10-CM | POA: Diagnosis not present

## 2017-01-17 DIAGNOSIS — N186 End stage renal disease: Secondary | ICD-10-CM | POA: Diagnosis not present

## 2017-01-19 DIAGNOSIS — N2581 Secondary hyperparathyroidism of renal origin: Secondary | ICD-10-CM | POA: Diagnosis not present

## 2017-01-19 DIAGNOSIS — D631 Anemia in chronic kidney disease: Secondary | ICD-10-CM | POA: Diagnosis not present

## 2017-01-19 DIAGNOSIS — N186 End stage renal disease: Secondary | ICD-10-CM | POA: Diagnosis not present

## 2017-01-19 DIAGNOSIS — D509 Iron deficiency anemia, unspecified: Secondary | ICD-10-CM | POA: Diagnosis not present

## 2017-01-21 ENCOUNTER — Emergency Department (HOSPITAL_COMMUNITY)
Admission: EM | Admit: 2017-01-21 | Discharge: 2017-01-21 | Discharge status: Against Medical Advice | Payer: Medicare Other | Attending: Emergency Medicine | Admitting: Emergency Medicine

## 2017-01-21 ENCOUNTER — Encounter (HOSPITAL_COMMUNITY): Payer: Self-pay

## 2017-01-21 DIAGNOSIS — Z5329 Procedure and treatment not carried out because of patient's decision for other reasons: Secondary | ICD-10-CM | POA: Insufficient documentation

## 2017-01-21 DIAGNOSIS — D631 Anemia in chronic kidney disease: Secondary | ICD-10-CM | POA: Diagnosis not present

## 2017-01-21 DIAGNOSIS — F17210 Nicotine dependence, cigarettes, uncomplicated: Secondary | ICD-10-CM | POA: Insufficient documentation

## 2017-01-21 DIAGNOSIS — Z79899 Other long term (current) drug therapy: Secondary | ICD-10-CM | POA: Diagnosis not present

## 2017-01-21 DIAGNOSIS — N2581 Secondary hyperparathyroidism of renal origin: Secondary | ICD-10-CM | POA: Diagnosis not present

## 2017-01-21 DIAGNOSIS — R079 Chest pain, unspecified: Secondary | ICD-10-CM

## 2017-01-21 DIAGNOSIS — I120 Hypertensive chronic kidney disease with stage 5 chronic kidney disease or end stage renal disease: Secondary | ICD-10-CM | POA: Diagnosis not present

## 2017-01-21 DIAGNOSIS — Z992 Dependence on renal dialysis: Secondary | ICD-10-CM | POA: Insufficient documentation

## 2017-01-21 DIAGNOSIS — D509 Iron deficiency anemia, unspecified: Secondary | ICD-10-CM | POA: Diagnosis not present

## 2017-01-21 DIAGNOSIS — N186 End stage renal disease: Secondary | ICD-10-CM | POA: Diagnosis not present

## 2017-01-21 DIAGNOSIS — R0789 Other chest pain: Secondary | ICD-10-CM | POA: Diagnosis not present

## 2017-01-21 DIAGNOSIS — J449 Chronic obstructive pulmonary disease, unspecified: Secondary | ICD-10-CM | POA: Insufficient documentation

## 2017-01-21 NOTE — ED Notes (Signed)
Pt left AMA °

## 2017-01-21 NOTE — ED Provider Notes (Signed)
[facility] DEPT Provider Note   CSN: [id number] Arrival date & time: 01/21/17  1714     History   Chief Complaint Chief Complaint  Patient presents with  . Chest Pain    post dialysis     HPI [name] is a 56 y.o. male.  [name] with past medical history of hypertension, COPD, cardiomegaly, end-stage renal disease for which he is on hemodialysis. He presents today for evaluation of chest pain. He had his normal dialysis session today, then went home. Shortly after arriving home, he developed tightness in his chest and left arm that lasted for approximately an hour. He then called 911 and was transported here. He denies any shortness of breath, nausea, diaphoresis.  Shortly after arriving to the ER his pain resolved and he is now refusing care.   The history is provided by the patient.  Chest Pain   This is a new problem. The current episode started 1 to 2 hours ago. The problem occurs constantly. The problem has been resolved. The pain is present in the substernal region. The pain is moderate. The quality of the pain is described as pressure-like. The pain does not radiate. Pertinent negatives include no cough, no fever, no nausea and no shortness of breath. He has tried nothing for the symptoms.    Past Medical History:  Diagnosis Date  . Adenomatous colon polyp    tubular  . Anemia   . Anxiety   . Arthritis    left shoulder  . Atherosclerosis of aorta ([place])   . Cardiomegaly   . Chest pain    DATE UNKNOWN, C/O PERIODICALLY  . Chronic kidney disease    dialysis - M/W/F  . Cocaine abuse   . COPD exacerbation ([place]) 08/17/2016  . Dialysis patient ([place])    Monday-Wednesday-Friday  . GERD (gastroesophageal reflux disease)    DATE UNKNOWN  . Hemorrhoids   . Hepatitis B, chronic ([place])   . Hepatitis C   . Hyperkalemia   . Hypertension   . Metabolic bone disease    Patient denies  . Nephrolithiasis   . Pneumonia   . Pulmonary edema   .  Renal disorder   . Renal insufficiency   . Shortness of breath dyspnea    " for the last past year with this dialysis"  . Tubular adenoma of colon     Patient Active Problem List   Diagnosis Date Noted  . COPD GOLD II/ still smoking 09/27/2016  . Essential hypertension 09/27/2016  . Fluid overload 08/30/2016  . Acute respiratory failure with hypoxia ([place]) 08/17/2016  . COPD exacerbation ([place]) 08/17/2016  . Hypertensive urgency 08/17/2016  . Acute bronchitis 08/17/2016  . Elevated troponin 08/17/2016  . Respiratory failure ([place]) 08/17/2016  . Problem with dialysis access ([place]) 07/23/2016  . End stage renal disease ([place]) 12/02/2015  . Chronic hepatitis B ([place]) 03/05/2014  . Chronic hepatitis C without hepatic coma ([place]) 03/05/2014  . Internal hemorrhoids with bleeding, swelling and itching 03/05/2014  . Thrombocytopenia ([place]) 03/05/2014  . Chest pain 02/27/2014  . Alcohol abuse 04/14/2009  . Cigarette smoker 04/14/2009  . GANGLION CYST 04/14/2009    Past Surgical History:  Procedure Laterality Date  . AV FISTULA PLACEMENT  2012   BELIEVED WAS PLACED IN JUNE  . COLONOSCOPY    . HEMORRHOID BANDING    . LIGATION OF ARTERIOVENOUS  FISTULA Left 11/12/6642   Procedure: Plication of Left Arm Arteriovenous Fistula;  Surgeon: Jessy Oto  Fields, MD;  Location: [facility];  Service: Vascular;  Laterality: Left;  . POLYPECTOMY    . REVISON OF ARTERIOVENOUS FISTULA Left 7/40/8144   Procedure: PLICATION OF DISTAL ANEURYSMAL SEGEMENT OF LEFT UPPER ARM ARTERIOVENOUS FISTULA;  Surgeon: Elam Dutch, MD;  Location: [facility];  Service: Vascular;  Laterality: Left;  . REVISON OF ARTERIOVENOUS FISTULA Left 02/26/5630   Procedure: Plication of Left Upper Arm Fistula ;  Surgeon: Waynetta Sandy, MD;  Location: [facility] OR;  Service: Vascular;  Laterality: Left;       Home Medications    Prior to Admission medications   Medication Sig Start Date End Date Taking? Authorizing Provider    acetaminophen (TYLENOL) 500 MG tablet Take 500-1,000 mg by mouth every 6 (six) hours as needed (for headaches and pain).    [provider]  albuterol (PROVENTIL HFA;VENTOLIN HFA) 108 (90 Base) MCG/ACT inhaler Inhale 2 puffs into the lungs every 6 (six) hours as needed for wheezing or shortness of breath.     [provider]  budesonide-formoterol (SYMBICORT) 160-4.5 MCG/ACT inhaler Inhale 2 puffs into the lungs 2 (two) times daily.    [provider]  gabapentin (NEURONTIN) 100 MG capsule Take 100 mg by mouth up to two times a day 01/06/17   [provider]  HYDROcodone-acetaminophen (NORCO/VICODIN) 5-325 MG tablet Take 1 tablet by mouth every 6 (six) hours as needed for moderate pain. 01/14/17   Milton Ferguson, MD  minoxidil (LONITEN) 10 MG tablet Take 10 mg by mouth daily.    [provider]  oxyCODONE-acetaminophen (PERCOCET) 5-325 MG tablet Take 1 tablet by mouth every 6 (six) hours as needed. 01/14/17   Milton Ferguson, MD  polyethylene glycol powder (GLYCOLAX/MIRALAX) powder Take 17grams (1 capful) dissolved in at least 8 ounces of water or juice, once daily 01/13/17   Pyrtle, Lajuan Lines, MD  sevelamer carbonate (RENVELA) 800 MG tablet Take 2,400 mg by mouth 3 (three) times daily with meals.    [provider]    Family History Family History  Problem Relation Age of Onset  . Heart disease Mother   . Lung cancer Mother   . Heart disease Father   . Malignant hyperthermia Father   . Throat cancer Sister   . Esophageal cancer Sister   . Hypertension Other   . COPD Other   . Colon cancer Neg Hx   . Colon polyps Neg Hx   . Rectal cancer Neg Hx   . Stomach cancer Neg Hx     Social History Social History  Substance Use Topics  . Smoking status: Current Every Day Smoker    Packs/day: 1.00    Years: 43.00    Types: Cigarettes  . Smokeless tobacco: Never Used     Comment: 2-3 cigarettes per day  . Alcohol use 2.4 oz/week    1 Glasses of  wine, 1 Cans of beer, 1 Shots of liquor, 1 Standard drinks or equivalent per week     Comment: occasional      Allergies   Aspirin; Clonidine derivatives; and Tramadol   Review of Systems Review of Systems  Constitutional: Negative for fever.  Respiratory: Negative for cough and shortness of breath.   Cardiovascular: Positive for chest pain.  Gastrointestinal: Negative for nausea.  All other systems reviewed and are negative.    Physical Exam Updated Vital Signs BP (!) 110/47 (BP Location: Right Arm)   Pulse 64   Temp 97.9 F (36.6 C) (Oral)   Resp  18   SpO2 96%   Physical Exam  Constitutional: He is oriented to person, place, and time. He appears well-developed and well-nourished. No distress.  HENT:  Head: Normocephalic and atraumatic.  Mouth/Throat: Oropharynx is clear and moist.  Neck: Normal range of motion. Neck supple.  Cardiovascular: Normal rate and regular rhythm.  Exam reveals no friction rub.   No murmur heard. Pulmonary/Chest: Effort normal and breath sounds normal. No respiratory distress. He has no wheezes. He has no rales.  Abdominal: Soft. Bowel sounds are normal. He exhibits no distension. There is no tenderness.  Musculoskeletal: Normal range of motion. He exhibits no edema.  Neurological: He is alert and oriented to person, place, and time. Coordination normal.  Skin: Skin is warm and dry. He is not diaphoretic.  Nursing note and vitals reviewed.    ED Treatments / Results  Labs (all labs ordered are listed, but only abnormal results are displayed) Labs Reviewed - No data to display  EKG ED ECG REPORT   Date: 01/21/2017  Rate: 67  Rhythm: atrial fibrillation  QRS Axis: indeterminate  Intervals: normal  ST/T Wave abnormalities: nonspecific T wave changes  Conduction Disutrbances:left bundle branch block  Narrative Interpretation:   Old EKG Reviewed: unchanged  I have personally reviewed the EKG tracing and agree with the computerized  printout as noted.   Radiology No results found.  Procedures Procedures (including critical care time)  Medications Ordered in ED Medications - No data to display   Initial Impression / Assessment and Plan / ED Course  I have reviewed the triage vital signs and the nursing notes.  Pertinent labs & imaging results that were available during my care of the patient were reviewed by me and considered in my medical decision making (see chart for details).  Patient presents here complaining of chest pain shortly after arriving home from a dialysis session. He was brought here by EMS and shortly after arriving here, his symptoms resolved. When I went in to evaluate him, he informed me he felt fine and was refusing any treatment or workup. He told me that he had experienced similar episodes and was "seen here 3 times last week with no cause found". He informed me he was going to leave. He agreed to sign himself out [place]. He was informed of the possible bad outcomes including death and serious disability that could result from not having any workup. He appeared to have decision-making capacity.  Final Clinical Impressions(s) / ED Diagnoses   Final diagnoses:  None    New Prescriptions New Prescriptions   No medications on file     Veryl Speak, MD 01/22/17 [phone]

## 2017-01-24 DIAGNOSIS — D631 Anemia in chronic kidney disease: Secondary | ICD-10-CM | POA: Diagnosis not present

## 2017-01-24 DIAGNOSIS — N186 End stage renal disease: Secondary | ICD-10-CM | POA: Diagnosis not present

## 2017-01-24 DIAGNOSIS — D509 Iron deficiency anemia, unspecified: Secondary | ICD-10-CM | POA: Diagnosis not present

## 2017-01-24 DIAGNOSIS — N2581 Secondary hyperparathyroidism of renal origin: Secondary | ICD-10-CM | POA: Diagnosis not present

## 2017-01-26 DIAGNOSIS — B1910 Unspecified viral hepatitis B without hepatic coma: Secondary | ICD-10-CM | POA: Diagnosis not present

## 2017-01-26 DIAGNOSIS — J45909 Unspecified asthma, uncomplicated: Secondary | ICD-10-CM | POA: Diagnosis not present

## 2017-01-26 DIAGNOSIS — D638 Anemia in other chronic diseases classified elsewhere: Secondary | ICD-10-CM | POA: Diagnosis not present

## 2017-01-26 DIAGNOSIS — I10 Essential (primary) hypertension: Secondary | ICD-10-CM | POA: Diagnosis not present

## 2017-01-26 DIAGNOSIS — D509 Iron deficiency anemia, unspecified: Secondary | ICD-10-CM | POA: Diagnosis not present

## 2017-01-26 DIAGNOSIS — B182 Chronic viral hepatitis C: Secondary | ICD-10-CM | POA: Diagnosis not present

## 2017-01-26 DIAGNOSIS — I429 Cardiomyopathy, unspecified: Secondary | ICD-10-CM | POA: Diagnosis not present

## 2017-01-26 DIAGNOSIS — Z720 Tobacco use: Secondary | ICD-10-CM | POA: Diagnosis not present

## 2017-01-26 DIAGNOSIS — G4700 Insomnia, unspecified: Secondary | ICD-10-CM | POA: Diagnosis not present

## 2017-01-26 DIAGNOSIS — N2581 Secondary hyperparathyroidism of renal origin: Secondary | ICD-10-CM | POA: Diagnosis not present

## 2017-01-26 DIAGNOSIS — N186 End stage renal disease: Secondary | ICD-10-CM | POA: Diagnosis not present

## 2017-01-26 DIAGNOSIS — G8929 Other chronic pain: Secondary | ICD-10-CM | POA: Diagnosis not present

## 2017-01-26 DIAGNOSIS — F419 Anxiety disorder, unspecified: Secondary | ICD-10-CM | POA: Diagnosis not present

## 2017-01-26 DIAGNOSIS — D631 Anemia in chronic kidney disease: Secondary | ICD-10-CM | POA: Diagnosis not present

## 2017-01-28 DIAGNOSIS — N2581 Secondary hyperparathyroidism of renal origin: Secondary | ICD-10-CM | POA: Diagnosis not present

## 2017-01-28 DIAGNOSIS — D509 Iron deficiency anemia, unspecified: Secondary | ICD-10-CM | POA: Diagnosis not present

## 2017-01-28 DIAGNOSIS — D631 Anemia in chronic kidney disease: Secondary | ICD-10-CM | POA: Diagnosis not present

## 2017-01-28 DIAGNOSIS — N186 End stage renal disease: Secondary | ICD-10-CM | POA: Diagnosis not present

## 2017-01-31 DIAGNOSIS — D509 Iron deficiency anemia, unspecified: Secondary | ICD-10-CM | POA: Diagnosis not present

## 2017-01-31 DIAGNOSIS — N186 End stage renal disease: Secondary | ICD-10-CM | POA: Diagnosis not present

## 2017-01-31 DIAGNOSIS — N2581 Secondary hyperparathyroidism of renal origin: Secondary | ICD-10-CM | POA: Diagnosis not present

## 2017-01-31 DIAGNOSIS — D631 Anemia in chronic kidney disease: Secondary | ICD-10-CM | POA: Diagnosis not present

## 2017-02-02 DIAGNOSIS — D631 Anemia in chronic kidney disease: Secondary | ICD-10-CM | POA: Diagnosis not present

## 2017-02-02 DIAGNOSIS — N186 End stage renal disease: Secondary | ICD-10-CM | POA: Diagnosis not present

## 2017-02-02 DIAGNOSIS — N2581 Secondary hyperparathyroidism of renal origin: Secondary | ICD-10-CM | POA: Diagnosis not present

## 2017-02-02 DIAGNOSIS — D509 Iron deficiency anemia, unspecified: Secondary | ICD-10-CM | POA: Diagnosis not present

## 2017-02-03 DIAGNOSIS — M79602 Pain in left arm: Secondary | ICD-10-CM | POA: Diagnosis not present

## 2017-02-03 DIAGNOSIS — G8929 Other chronic pain: Secondary | ICD-10-CM | POA: Diagnosis not present

## 2017-02-04 ENCOUNTER — Encounter: Payer: Self-pay | Admitting: Internal Medicine

## 2017-02-04 DIAGNOSIS — D509 Iron deficiency anemia, unspecified: Secondary | ICD-10-CM | POA: Diagnosis not present

## 2017-02-04 DIAGNOSIS — D631 Anemia in chronic kidney disease: Secondary | ICD-10-CM | POA: Diagnosis not present

## 2017-02-04 DIAGNOSIS — N186 End stage renal disease: Secondary | ICD-10-CM | POA: Diagnosis not present

## 2017-02-04 DIAGNOSIS — N2581 Secondary hyperparathyroidism of renal origin: Secondary | ICD-10-CM | POA: Diagnosis not present

## 2017-02-07 DIAGNOSIS — N186 End stage renal disease: Secondary | ICD-10-CM | POA: Diagnosis not present

## 2017-02-07 DIAGNOSIS — N2581 Secondary hyperparathyroidism of renal origin: Secondary | ICD-10-CM | POA: Diagnosis not present

## 2017-02-07 DIAGNOSIS — D631 Anemia in chronic kidney disease: Secondary | ICD-10-CM | POA: Diagnosis not present

## 2017-02-07 DIAGNOSIS — D509 Iron deficiency anemia, unspecified: Secondary | ICD-10-CM | POA: Diagnosis not present

## 2017-02-08 DIAGNOSIS — I120 Hypertensive chronic kidney disease with stage 5 chronic kidney disease or end stage renal disease: Secondary | ICD-10-CM | POA: Diagnosis not present

## 2017-02-08 DIAGNOSIS — Z992 Dependence on renal dialysis: Secondary | ICD-10-CM | POA: Diagnosis not present

## 2017-02-08 DIAGNOSIS — N186 End stage renal disease: Secondary | ICD-10-CM | POA: Diagnosis not present

## 2017-02-09 DIAGNOSIS — D631 Anemia in chronic kidney disease: Secondary | ICD-10-CM | POA: Diagnosis not present

## 2017-02-09 DIAGNOSIS — N2581 Secondary hyperparathyroidism of renal origin: Secondary | ICD-10-CM | POA: Diagnosis not present

## 2017-02-09 DIAGNOSIS — N186 End stage renal disease: Secondary | ICD-10-CM | POA: Diagnosis not present

## 2017-02-09 DIAGNOSIS — D509 Iron deficiency anemia, unspecified: Secondary | ICD-10-CM | POA: Diagnosis not present

## 2017-02-11 DIAGNOSIS — N186 End stage renal disease: Secondary | ICD-10-CM | POA: Diagnosis not present

## 2017-02-11 DIAGNOSIS — D509 Iron deficiency anemia, unspecified: Secondary | ICD-10-CM | POA: Diagnosis not present

## 2017-02-11 DIAGNOSIS — D631 Anemia in chronic kidney disease: Secondary | ICD-10-CM | POA: Diagnosis not present

## 2017-02-11 DIAGNOSIS — N2581 Secondary hyperparathyroidism of renal origin: Secondary | ICD-10-CM | POA: Diagnosis not present

## 2017-02-14 DIAGNOSIS — N2581 Secondary hyperparathyroidism of renal origin: Secondary | ICD-10-CM | POA: Diagnosis not present

## 2017-02-14 DIAGNOSIS — N186 End stage renal disease: Secondary | ICD-10-CM | POA: Diagnosis not present

## 2017-02-14 DIAGNOSIS — D631 Anemia in chronic kidney disease: Secondary | ICD-10-CM | POA: Diagnosis not present

## 2017-02-14 DIAGNOSIS — D509 Iron deficiency anemia, unspecified: Secondary | ICD-10-CM | POA: Diagnosis not present

## 2017-02-15 ENCOUNTER — Other Ambulatory Visit: Payer: Medicare Other

## 2017-02-15 DIAGNOSIS — B182 Chronic viral hepatitis C: Secondary | ICD-10-CM | POA: Diagnosis not present

## 2017-02-15 DIAGNOSIS — B181 Chronic viral hepatitis B without delta-agent: Secondary | ICD-10-CM | POA: Diagnosis not present

## 2017-02-15 LAB — COMPLETE METABOLIC PANEL WITH GFR
ALT: 8 U/L — ABNORMAL LOW (ref 9–46)
AST: 16 U/L (ref 10–35)
Albumin: 4.4 g/dL (ref 3.6–5.1)
Alkaline Phosphatase: 166 U/L — ABNORMAL HIGH (ref 40–115)
BUN: 25 mg/dL (ref 7–25)
CO2: 30 mmol/L (ref 20–32)
Calcium: 9 mg/dL (ref 8.6–10.3)
Chloride: 90 mmol/L — ABNORMAL LOW (ref 98–110)
Creat: 9.18 mg/dL — ABNORMAL HIGH (ref 0.70–1.33)
GFR, Est African American: 7 mL/min — ABNORMAL LOW (ref >=60)
GFR, Est Non African American: 6 mL/min — ABNORMAL LOW (ref >=60)
Glucose, Bld: 97 mg/dL (ref 65–99)
Potassium: 4 mmol/L (ref 3.5–5.3)
Sodium: 137 mmol/L (ref 135–146)
Total Bilirubin: 0.9 mg/dL (ref 0.2–1.2)
Total Protein: 7.3 g/dL (ref 6.1–8.1)

## 2017-02-16 ENCOUNTER — Telehealth: Payer: Self-pay | Admitting: *Deleted

## 2017-02-16 DIAGNOSIS — D631 Anemia in chronic kidney disease: Secondary | ICD-10-CM | POA: Diagnosis not present

## 2017-02-16 DIAGNOSIS — D509 Iron deficiency anemia, unspecified: Secondary | ICD-10-CM | POA: Diagnosis not present

## 2017-02-16 DIAGNOSIS — N186 End stage renal disease: Secondary | ICD-10-CM | POA: Diagnosis not present

## 2017-02-16 DIAGNOSIS — N2581 Secondary hyperparathyroidism of renal origin: Secondary | ICD-10-CM | POA: Diagnosis not present

## 2017-02-16 NOTE — Telephone Encounter (Signed)
Call from [facility] lab with an alert creatinine of 9.18. Patient is end stage renal disease and on dialysis. Note routed to MD. Myrtis Hopping

## 2017-02-17 LAB — HEPATITIS C RNA QUANTITATIVE
HCV Quantitative Log: <1.18 Log IU/mL
HCV Quantitative: <15 IU/mL

## 2017-02-17 LAB — HEPATITIS B DNA, ULTRAQUANTITATIVE, PCR
Hepatitis B DNA (Calc): <1 Log IU/mL — ABNORMAL HIGH
Hepatitis B DNA: <10 IU/mL — ABNORMAL HIGH

## 2017-02-18 DIAGNOSIS — N186 End stage renal disease: Secondary | ICD-10-CM | POA: Diagnosis not present

## 2017-02-18 DIAGNOSIS — D509 Iron deficiency anemia, unspecified: Secondary | ICD-10-CM | POA: Diagnosis not present

## 2017-02-18 DIAGNOSIS — N2581 Secondary hyperparathyroidism of renal origin: Secondary | ICD-10-CM | POA: Diagnosis not present

## 2017-02-18 DIAGNOSIS — D631 Anemia in chronic kidney disease: Secondary | ICD-10-CM | POA: Diagnosis not present

## 2017-02-19 ENCOUNTER — Encounter (HOSPITAL_COMMUNITY): Payer: Self-pay | Admitting: Emergency Medicine

## 2017-02-19 ENCOUNTER — Emergency Department (HOSPITAL_COMMUNITY)
Admission: EM | Admit: 2017-02-19 | Discharge: 2017-02-19 | Disposition: A | Discharge status: Home / Self Care | Payer: Medicare Other | Source: Home / Self Care | Attending: Emergency Medicine | Admitting: Emergency Medicine

## 2017-02-19 ENCOUNTER — Emergency Department (HOSPITAL_COMMUNITY): Payer: Medicare Other

## 2017-02-19 DIAGNOSIS — R0602 Shortness of breath: Secondary | ICD-10-CM | POA: Diagnosis not present

## 2017-02-19 DIAGNOSIS — E162 Hypoglycemia, unspecified: Secondary | ICD-10-CM | POA: Diagnosis not present

## 2017-02-19 DIAGNOSIS — I120 Hypertensive chronic kidney disease with stage 5 chronic kidney disease or end stage renal disease: Secondary | ICD-10-CM

## 2017-02-19 DIAGNOSIS — Z992 Dependence on renal dialysis: Secondary | ICD-10-CM | POA: Insufficient documentation

## 2017-02-19 DIAGNOSIS — J449 Chronic obstructive pulmonary disease, unspecified: Secondary | ICD-10-CM

## 2017-02-19 DIAGNOSIS — Z79899 Other long term (current) drug therapy: Secondary | ICD-10-CM

## 2017-02-19 DIAGNOSIS — R0600 Dyspnea, unspecified: Secondary | ICD-10-CM | POA: Insufficient documentation

## 2017-02-19 DIAGNOSIS — N186 End stage renal disease: Secondary | ICD-10-CM

## 2017-02-19 DIAGNOSIS — I25110 Atherosclerotic heart disease of native coronary artery with unstable angina pectoris: Secondary | ICD-10-CM | POA: Diagnosis not present

## 2017-02-19 DIAGNOSIS — I4891 Unspecified atrial fibrillation: Secondary | ICD-10-CM | POA: Diagnosis not present

## 2017-02-19 DIAGNOSIS — I5033 Acute on chronic diastolic (congestive) heart failure: Secondary | ICD-10-CM | POA: Diagnosis not present

## 2017-02-19 DIAGNOSIS — E871 Hypo-osmolality and hyponatremia: Secondary | ICD-10-CM | POA: Diagnosis not present

## 2017-02-19 DIAGNOSIS — I132 Hypertensive heart and chronic kidney disease with heart failure and with stage 5 chronic kidney disease, or end stage renal disease: Secondary | ICD-10-CM | POA: Diagnosis not present

## 2017-02-19 DIAGNOSIS — B181 Chronic viral hepatitis B without delta-agent: Secondary | ICD-10-CM | POA: Diagnosis not present

## 2017-02-19 DIAGNOSIS — F17210 Nicotine dependence, cigarettes, uncomplicated: Secondary | ICD-10-CM | POA: Insufficient documentation

## 2017-02-19 DIAGNOSIS — R069 Unspecified abnormalities of breathing: Secondary | ICD-10-CM | POA: Diagnosis not present

## 2017-02-19 DIAGNOSIS — E875 Hyperkalemia: Secondary | ICD-10-CM | POA: Diagnosis not present

## 2017-02-19 DIAGNOSIS — F430 Acute stress reaction: Secondary | ICD-10-CM

## 2017-02-19 LAB — BASIC METABOLIC PANEL WITH GFR
Anion gap: 17 — ABNORMAL HIGH (ref 5–15)
BUN: 29 mg/dL — ABNORMAL HIGH (ref 6–20)
CO2: 26 mmol/L (ref 22–32)
Calcium: 8.1 mg/dL — ABNORMAL LOW (ref 8.9–10.3)
Chloride: 88 mmol/L — ABNORMAL LOW (ref 101–111)
Creatinine, Ser: 8.62 mg/dL — ABNORMAL HIGH (ref 0.61–1.24)
GFR calc Af Amer: 7 mL/min — ABNORMAL LOW
GFR calc non Af Amer: 6 mL/min — ABNORMAL LOW
Glucose, Bld: 87 mg/dL (ref 65–99)
Potassium: 5.1 mmol/L (ref 3.5–5.1)
Sodium: 131 mmol/L — ABNORMAL LOW (ref 135–145)

## 2017-02-19 LAB — CBC
HCT: 34.1 % — ABNORMAL LOW (ref 39.0–52.0)
Hemoglobin: 11.8 g/dL — ABNORMAL LOW (ref 13.0–17.0)
MCH: 29.4 pg (ref 26.0–34.0)
MCHC: 34.6 g/dL (ref 30.0–36.0)
MCV: 84.8 fL (ref 78.0–100.0)
Platelets: 59 K/uL — ABNORMAL LOW (ref 150–400)
RBC: 4.02 MIL/uL — ABNORMAL LOW (ref 4.22–5.81)
RDW: 17.3 % — ABNORMAL HIGH (ref 11.5–15.5)
WBC: 8.1 K/uL (ref 4.0–10.5)

## 2017-02-19 LAB — I-STAT TROPONIN, ED: Troponin i, poc: 0.05 ng/mL (ref 0.00–0.08)

## 2017-02-19 MED ORDER — ALBUTEROL SULFATE (2.5 MG/3ML) 0.083% IN NEBU
5.0000 mg | INHALATION_SOLUTION | Freq: Once | RESPIRATORY_TRACT | Status: AC
  Administered 2017-02-19: 5 mg via RESPIRATORY_TRACT
  Filled 2017-02-19: qty 6

## 2017-02-19 MED ORDER — LORAZEPAM 1 MG PO TABS
1.0000 mg | ORAL_TABLET | Freq: Once | ORAL | Status: AC
  Administered 2017-02-19: 1 mg via ORAL
  Filled 2017-02-19: qty 1

## 2017-02-19 NOTE — ED Notes (Signed)
ED Provider at bedside. 

## 2017-02-19 NOTE — ED Triage Notes (Signed)
Pt BIB EMS from home for SOB. Pt has hx of COPD and received 3/4 hours of dialysis yesterday. Pt sats 97% on RA. LBBB noted on EMS EKG. Resp e/u; Denies cp. NAD at this time.

## 2017-02-19 NOTE — ED Provider Notes (Addendum)
[facility] DEPT Provider Note   CSN: [id number] Arrival date & time: 02/19/17  1500     History   Chief Complaint Chief Complaint  Patient presents with  . Shortness of Breath    HPI [name] is a 56 y.o. male.  Patient w hx copd, esrd/hd notes recent increased stress/bills/money, and states got to worrying today and felt sob.  Pt indicates sob moderate, persistent. +non prod cough. No sore throat.   Pt states hasnt eaten in 2 days due to stress. No chest pain or discomfort. No leg pain or swelling.  Uses mdi prn. Denies increased use. Compliant w normal meds. Went to normal hd yesterday. Makes no urine at baseline. No leg swelling or increased pain. No fever or chills.    The history is provided by the patient.  Shortness of Breath  Associated symptoms include cough. Pertinent negatives include no fever, no headaches, no sore throat, no neck pain, no chest pain, no vomiting, no abdominal pain and no rash.    Past Medical History:  Diagnosis Date  . Adenomatous colon polyp    tubular  . Anemia   . Anxiety   . Arthritis    left shoulder  . Atherosclerosis of aorta ([place])   . Cardiomegaly   . Chest pain    DATE UNKNOWN, C/O PERIODICALLY  . Chronic kidney disease    dialysis - M/W/F  . Cocaine abuse   . COPD exacerbation ([place]) 08/17/2016  . Dialysis patient ([place])    Monday-Wednesday-Friday  . GERD (gastroesophageal reflux disease)    DATE UNKNOWN  . Hemorrhoids   . Hepatitis B, chronic ([place])   . Hepatitis C   . Hyperkalemia   . Hypertension   . Metabolic bone disease    Patient denies  . Nephrolithiasis   . Pneumonia   . Pulmonary edema   . Renal disorder   . Renal insufficiency   . Shortness of breath dyspnea    " for the last past year with this dialysis"  . Tubular adenoma of colon     Patient Active Problem List   Diagnosis Date Noted  . COPD GOLD II/ still smoking 09/27/2016  . Essential hypertension 09/27/2016  . Fluid overload 08/30/2016   . Acute respiratory failure with hypoxia ([place]) 08/17/2016  . COPD exacerbation ([place]) 08/17/2016  . Hypertensive urgency 08/17/2016  . Acute bronchitis 08/17/2016  . Elevated troponin 08/17/2016  . Respiratory failure ([place]) 08/17/2016  . Problem with dialysis access ([place]) 07/23/2016  . End stage renal disease ([place]) 12/02/2015  . Chronic hepatitis B ([place]) 03/05/2014  . Chronic hepatitis C without hepatic coma ([place]) 03/05/2014  . Internal hemorrhoids with bleeding, swelling and itching 03/05/2014  . Thrombocytopenia ([place]) 03/05/2014  . Chest pain 02/27/2014  . Alcohol abuse 04/14/2009  . Cigarette smoker 04/14/2009  . GANGLION CYST 04/14/2009    Past Surgical History:  Procedure Laterality Date  . AV FISTULA PLACEMENT  2012   BELIEVED WAS PLACED IN JUNE  . COLONOSCOPY    . HEMORRHOID BANDING    . LIGATION OF ARTERIOVENOUS  FISTULA Left 09/10/9516   Procedure: Plication of Left Arm Arteriovenous Fistula;  Surgeon: Elam Dutch, MD;  Location: [facility];  Service: Vascular;  Laterality: Left;  . POLYPECTOMY    . REVISON OF ARTERIOVENOUS FISTULA Left 8/41/6606   Procedure: PLICATION OF DISTAL ANEURYSMAL SEGEMENT OF LEFT UPPER ARM ARTERIOVENOUS FISTULA;  Surgeon: Elam Dutch, MD;  Location: [facility];  Service: Vascular;  Laterality:  Left;  . REVISON OF ARTERIOVENOUS FISTULA Left 6/65/9935   Procedure: Plication of Left Upper Arm Fistula ;  Surgeon: Waynetta Sandy, MD;  Location: [facility] OR;  Service: Vascular;  Laterality: Left;       Home Medications    Prior to Admission medications   Medication Sig Start Date End Date Taking? Authorizing Provider  acetaminophen (TYLENOL) 500 MG tablet Take 500-1,000 mg by mouth every 6 (six) hours as needed (for headaches and pain).    [provider]  albuterol (PROVENTIL HFA;VENTOLIN HFA) 108 (90 Base) MCG/ACT inhaler Inhale 2 puffs into the lungs every 6 (six) hours as needed for wheezing or shortness of breath.     [provider]  budesonide-formoterol (SYMBICORT) 160-4.5 MCG/ACT inhaler Inhale 2 puffs into the lungs 2 (two) times daily.    [provider]  gabapentin (NEURONTIN) 100 MG capsule Take 100 mg by mouth up to two times a day 01/06/17   [provider]  HYDROcodone-acetaminophen (NORCO/VICODIN) 5-325 MG tablet Take 1 tablet by mouth every 6 (six) hours as needed for moderate pain. 01/14/17   Milton Ferguson, MD  minoxidil (LONITEN) 10 MG tablet Take 10 mg by mouth daily.    [provider]  oxyCODONE-acetaminophen (PERCOCET) 5-325 MG tablet Take 1 tablet by mouth every 6 (six) hours as needed. 01/14/17   Milton Ferguson, MD  polyethylene glycol powder (GLYCOLAX/MIRALAX) powder Take 17grams (1 capful) dissolved in at least 8 ounces of water or juice, once daily 01/13/17   Pyrtle, Lajuan Lines, MD  sevelamer carbonate (RENVELA) 800 MG tablet Take 2,400 mg by mouth 3 (three) times daily with meals.    [provider]    Family History Family History  Problem Relation Age of Onset  . Heart disease Mother   . Lung cancer Mother   . Heart disease Father   . Malignant hyperthermia Father   . Throat cancer Sister   . Esophageal cancer Sister   . Hypertension Other   . COPD Other   . Colon cancer Neg Hx   . Colon polyps Neg Hx   . Rectal cancer Neg Hx   . Stomach cancer Neg Hx     Social History Social History  Substance Use Topics  . Smoking status: Current Every Day Smoker    Packs/day: 1.00    Years: 43.00    Types: Cigarettes  . Smokeless tobacco: Never Used     Comment: 2-3 cigarettes per day  . Alcohol use 2.4 oz/week    1 Glasses of wine, 1 Cans of beer, 1 Shots of liquor, 1 Standard drinks or equivalent per week     Comment: occasional      Allergies   Aspirin; Clonidine derivatives; and Tramadol   Review of Systems Review of Systems  Constitutional: Negative for fever.  HENT: Negative for sore throat.   Eyes: Negative for redness.  Respiratory:  Positive for cough and shortness of breath.   Cardiovascular: Negative for chest pain.  Gastrointestinal: Negative for abdominal pain and vomiting.  Endocrine: Negative for polyuria.  Genitourinary: Negative for dysuria and flank pain.  Musculoskeletal: Negative for back pain and neck pain.  Skin: Negative for rash.  Neurological: Negative for headaches.  Hematological: Does not bruise/bleed easily.  Psychiatric/Behavioral: Negative for confusion.     Physical Exam Updated Vital Signs Ht 1.753 m (5\' 9" )   Wt 72.6 kg (160 lb)   SpO2 100%   BMI 23.63 kg/m   Physical Exam  Constitutional: He is oriented to person, place, and time. He appears well-developed and well-nourished. No distress.  HENT:  Mouth/Throat: Oropharynx is clear and moist.  Eyes: Conjunctivae are normal.  Neck: Neck supple. No JVD present. No tracheal deviation present.  Cardiovascular: Normal rate, regular rhythm, normal heart sounds and intact distal pulses.  Exam reveals no friction rub.   No murmur heard. Pulmonary/Chest: Effort normal and breath sounds normal. No accessory muscle usage. No respiratory distress.  Abdominal: Soft. He exhibits no distension. There is no tenderness.  Genitourinary:  Genitourinary Comments: No cva tenderness  Musculoskeletal: He exhibits no edema.  Left upper arm hd fistula w palp thrill  Neurological: He is alert and oriented to person, place, and time.  Skin: Skin is warm and dry. No rash noted. He is not diaphoretic.  Psychiatric: He has a normal mood and affect.  Nursing note and vitals reviewed.    ED Treatments / Results  Labs (all labs ordered are listed, but only abnormal results are displayed) Results for orders placed or performed during the hospital encounter of 02/19/17  CBC  Result Value Ref Range   WBC 8.1 4.0 - 10.5 K/uL   RBC 4.02 (L) 4.22 - 5.81 MIL/uL   Hemoglobin 11.8 (L) 13.0 - 17.0 g/dL   HCT 34.1 (L) 39.0 - 52.0 %   MCV 84.8 78.0 - 100.0 fL    MCH 29.4 26.0 - 34.0 pg   MCHC 34.6 30.0 - 36.0 g/dL   RDW 17.3 (H) 11.5 - 15.5 %   Platelets PENDING 150 - 400 K/uL  Basic metabolic panel  Result Value Ref Range   Sodium 131 (L) 135 - 145 mmol/L   Potassium 5.1 3.5 - 5.1 mmol/L   Chloride 88 (L) 101 - 111 mmol/L   CO2 26 22 - 32 mmol/L   Glucose, Bld 87 65 - 99 mg/dL   BUN 29 (H) 6 - 20 mg/dL   Creatinine, Ser 8.62 (H) 0.61 - 1.24 mg/dL   Calcium 8.1 (L) 8.9 - 10.3 mg/dL   GFR calc non Af Amer 6 (L) >60 mL/min   GFR calc Af Amer 7 (L) >60 mL/min   Anion gap 17 (H) 5 - 15  I-stat troponin, ED  Result Value Ref Range   Troponin i, poc 0.05 0.00 - 0.08 ng/mL   Comment 3           Dg Chest Port 1 View  Result Date: 02/19/2017 CLINICAL DATA:  Short of breath.  Dialysis patient EXAM: PORTABLE CHEST 1 VIEW COMPARISON:  01/14/2017 FINDINGS: Cardiac enlargement with mild vascular congestion. Negative for edema or effusion. Mild left lower lobe atelectasis unchanged. Atherosclerotic aortic arch IMPRESSION: Cardiac enlargement with mild vascular congestion. Negative for edema Mild left lower lobe atelectasis Electronically Signed   By: Franchot Gallo M.D.   On: 02/19/2017 16:02    EKG  EKG Interpretation  Date/Time:  Saturday February 19 2017 15:05:52 EDT Ventricular Rate:  66 PR Interval:    QRS Duration: 166 QT Interval:  525 QTC Calculation: 551 R Axis:   -81 Text Interpretation:  Sinus rhythm Left bundle branch block No significant change since last tracing Reconfirmed by Lajean Saver [phone]) on 02/19/2017 3:22:07 PM       Radiology Dg Chest Port 1 View  Result Date: 02/19/2017 CLINICAL DATA:  Short of breath.  Dialysis patient EXAM: PORTABLE CHEST 1 VIEW COMPARISON:  01/14/2017 FINDINGS: Cardiac enlargement with mild vascular congestion. Negative for edema or effusion. Mild left  lower lobe atelectasis unchanged. Atherosclerotic aortic arch IMPRESSION: Cardiac enlargement with mild vascular congestion. Negative for edema Mild  left lower lobe atelectasis Electronically Signed   By: Franchot Gallo M.D.   On: 02/19/2017 16:02    Procedures Procedures (including critical care time)  Medications Ordered in ED Medications - No data to display   Initial Impression / Assessment and Plan / ED Course  I have reviewed the triage vital signs and the nursing notes.  Pertinent labs & imaging results that were available during my care of the patient were reviewed by me and considered in my medical decision making (see chart for details).  Monitor. Labs.  Reviewed nursing notes and prior charts for additional history.   Last set vitals signs recorded in chart, not correct. Hr is 68. rr 16. Pulse ox 99%.  Pt requests d/c to home.    Final Clinical Impressions(s) / ED Diagnoses   Final diagnoses:  None    New Prescriptions New Prescriptions   No medications on file         Lajean Saver, MD 02/19/17 1711

## 2017-02-19 NOTE — Discharge Instructions (Signed)
It was our pleasure to provide your ER care today - we hope that you feel better.  Rest.  Avoid any smoking.  Follow up for dialysis tomorrow as planned.  Follow up with your primary care doctor in the coming week.  Return to ER if worse, new symptoms, chest pain, fevers, increased difficulty breathing, other concern.   You were given medication the ER - no driving for the next 6 hours.

## 2017-02-19 NOTE — ED Notes (Signed)
Got patient hooked up to the monitor did ekg shown to Dr Ashok Cordia

## 2017-02-19 NOTE — ED Notes (Signed)
Portable chest xray at bedside.

## 2017-02-20 ENCOUNTER — Inpatient Hospital Stay (HOSPITAL_COMMUNITY)
Admission: EM | Admit: 2017-02-20 | Discharge: 2017-02-23 | DRG: 246 | Disposition: A | Discharge status: Home / Self Care | Payer: Medicare Other | Attending: Family Medicine | Admitting: Family Medicine

## 2017-02-20 ENCOUNTER — Encounter (HOSPITAL_COMMUNITY): Payer: Self-pay | Admitting: Emergency Medicine

## 2017-02-20 ENCOUNTER — Emergency Department (HOSPITAL_COMMUNITY): Payer: Medicare Other

## 2017-02-20 DIAGNOSIS — E871 Hypo-osmolality and hyponatremia: Secondary | ICD-10-CM | POA: Diagnosis present

## 2017-02-20 DIAGNOSIS — D6959 Other secondary thrombocytopenia: Secondary | ICD-10-CM | POA: Diagnosis present

## 2017-02-20 DIAGNOSIS — I25110 Atherosclerotic heart disease of native coronary artery with unstable angina pectoris: Principal | ICD-10-CM | POA: Diagnosis present

## 2017-02-20 DIAGNOSIS — I953 Hypotension of hemodialysis: Secondary | ICD-10-CM | POA: Diagnosis not present

## 2017-02-20 DIAGNOSIS — I739 Peripheral vascular disease, unspecified: Secondary | ICD-10-CM | POA: Diagnosis present

## 2017-02-20 DIAGNOSIS — F419 Anxiety disorder, unspecified: Secondary | ICD-10-CM | POA: Diagnosis present

## 2017-02-20 DIAGNOSIS — I499 Cardiac arrhythmia, unspecified: Secondary | ICD-10-CM | POA: Diagnosis not present

## 2017-02-20 DIAGNOSIS — N186 End stage renal disease: Secondary | ICD-10-CM | POA: Diagnosis present

## 2017-02-20 DIAGNOSIS — I4891 Unspecified atrial fibrillation: Secondary | ICD-10-CM | POA: Diagnosis not present

## 2017-02-20 DIAGNOSIS — I472 Ventricular tachycardia: Secondary | ICD-10-CM | POA: Diagnosis present

## 2017-02-20 DIAGNOSIS — I160 Hypertensive urgency: Secondary | ICD-10-CM | POA: Diagnosis present

## 2017-02-20 DIAGNOSIS — D631 Anemia in chronic kidney disease: Secondary | ICD-10-CM | POA: Diagnosis present

## 2017-02-20 DIAGNOSIS — B181 Chronic viral hepatitis B without delta-agent: Secondary | ICD-10-CM | POA: Diagnosis present

## 2017-02-20 DIAGNOSIS — I447 Left bundle-branch block, unspecified: Secondary | ICD-10-CM | POA: Diagnosis present

## 2017-02-20 DIAGNOSIS — I700 Atherosclerosis of aorta: Secondary | ICD-10-CM | POA: Diagnosis present

## 2017-02-20 DIAGNOSIS — I132 Hypertensive heart and chronic kidney disease with heart failure and with stage 5 chronic kidney disease, or end stage renal disease: Secondary | ICD-10-CM | POA: Diagnosis present

## 2017-02-20 DIAGNOSIS — J449 Chronic obstructive pulmonary disease, unspecified: Secondary | ICD-10-CM | POA: Diagnosis not present

## 2017-02-20 DIAGNOSIS — I2584 Coronary atherosclerosis due to calcified coronary lesion: Secondary | ICD-10-CM | POA: Diagnosis present

## 2017-02-20 DIAGNOSIS — I43 Cardiomyopathy in diseases classified elsewhere: Secondary | ICD-10-CM | POA: Diagnosis present

## 2017-02-20 DIAGNOSIS — I214 Non-ST elevation (NSTEMI) myocardial infarction: Secondary | ICD-10-CM | POA: Diagnosis not present

## 2017-02-20 DIAGNOSIS — R0602 Shortness of breath: Secondary | ICD-10-CM | POA: Diagnosis not present

## 2017-02-20 DIAGNOSIS — E875 Hyperkalemia: Secondary | ICD-10-CM | POA: Diagnosis present

## 2017-02-20 DIAGNOSIS — F17210 Nicotine dependence, cigarettes, uncomplicated: Secondary | ICD-10-CM | POA: Diagnosis present

## 2017-02-20 DIAGNOSIS — I120 Hypertensive chronic kidney disease with stage 5 chronic kidney disease or end stage renal disease: Secondary | ICD-10-CM | POA: Diagnosis not present

## 2017-02-20 DIAGNOSIS — Z9119 Patient's noncompliance with other medical treatment and regimen: Secondary | ICD-10-CM

## 2017-02-20 DIAGNOSIS — Z992 Dependence on renal dialysis: Secondary | ICD-10-CM

## 2017-02-20 DIAGNOSIS — B182 Chronic viral hepatitis C: Secondary | ICD-10-CM | POA: Diagnosis present

## 2017-02-20 DIAGNOSIS — I080 Rheumatic disorders of both mitral and aortic valves: Secondary | ICD-10-CM | POA: Diagnosis present

## 2017-02-20 DIAGNOSIS — I5033 Acute on chronic diastolic (congestive) heart failure: Secondary | ICD-10-CM | POA: Diagnosis present

## 2017-02-20 DIAGNOSIS — Z885 Allergy status to narcotic agent status: Secondary | ICD-10-CM

## 2017-02-20 DIAGNOSIS — E162 Hypoglycemia, unspecified: Secondary | ICD-10-CM | POA: Diagnosis present

## 2017-02-20 DIAGNOSIS — I5043 Acute on chronic combined systolic (congestive) and diastolic (congestive) heart failure: Secondary | ICD-10-CM | POA: Diagnosis not present

## 2017-02-20 DIAGNOSIS — E8770 Fluid overload, unspecified: Secondary | ICD-10-CM

## 2017-02-20 DIAGNOSIS — Z888 Allergy status to other drugs, medicaments and biological substances status: Secondary | ICD-10-CM

## 2017-02-20 DIAGNOSIS — F1020 Alcohol dependence, uncomplicated: Secondary | ICD-10-CM | POA: Diagnosis present

## 2017-02-20 DIAGNOSIS — Z9111 Patient's noncompliance with dietary regimen: Secondary | ICD-10-CM | POA: Diagnosis not present

## 2017-02-20 DIAGNOSIS — Z886 Allergy status to analgesic agent status: Secondary | ICD-10-CM

## 2017-02-20 DIAGNOSIS — Z8249 Family history of ischemic heart disease and other diseases of the circulatory system: Secondary | ICD-10-CM

## 2017-02-20 DIAGNOSIS — Z9115 Patient's noncompliance with renal dialysis: Secondary | ICD-10-CM

## 2017-02-20 DIAGNOSIS — I480 Paroxysmal atrial fibrillation: Secondary | ICD-10-CM | POA: Diagnosis not present

## 2017-02-20 DIAGNOSIS — F1210 Cannabis abuse, uncomplicated: Secondary | ICD-10-CM | POA: Diagnosis present

## 2017-02-20 DIAGNOSIS — D696 Thrombocytopenia, unspecified: Secondary | ICD-10-CM | POA: Diagnosis present

## 2017-02-20 DIAGNOSIS — I10 Essential (primary) hypertension: Secondary | ICD-10-CM | POA: Diagnosis present

## 2017-02-20 DIAGNOSIS — F1010 Alcohol abuse, uncomplicated: Secondary | ICD-10-CM | POA: Diagnosis present

## 2017-02-20 DIAGNOSIS — Z825 Family history of asthma and other chronic lower respiratory diseases: Secondary | ICD-10-CM

## 2017-02-20 DIAGNOSIS — I471 Supraventricular tachycardia: Secondary | ICD-10-CM | POA: Diagnosis not present

## 2017-02-20 LAB — COMPREHENSIVE METABOLIC PANEL
ALT: 13 U/L — ABNORMAL LOW (ref 17–63)
AST: 44 U/L — ABNORMAL HIGH (ref 15–41)
Albumin: 4.2 g/dL (ref 3.5–5.0)
Alkaline Phosphatase: 160 U/L — ABNORMAL HIGH (ref 38–126)
Anion gap: 24 — ABNORMAL HIGH (ref 5–15)
BUN: 37 mg/dL — ABNORMAL HIGH (ref 6–20)
CO2: 23 mmol/L (ref 22–32)
Calcium: 8.9 mg/dL (ref 8.9–10.3)
Chloride: 87 mmol/L — ABNORMAL LOW (ref 101–111)
Creatinine, Ser: 10.23 mg/dL — ABNORMAL HIGH (ref 0.61–1.24)
GFR calc Af Amer: 6 mL/min — ABNORMAL LOW (ref >60)
GFR calc non Af Amer: 5 mL/min — ABNORMAL LOW (ref >60)
Glucose, Bld: 40 mg/dL — CL (ref 65–99)
Potassium: 6 mmol/L — ABNORMAL HIGH (ref 3.5–5.1)
Sodium: 134 mmol/L — ABNORMAL LOW (ref 135–145)
Total Bilirubin: 2.6 mg/dL — ABNORMAL HIGH (ref 0.3–1.2)
Total Protein: 8.1 g/dL (ref 6.5–8.1)

## 2017-02-20 LAB — I-STAT TROPONIN, ED: Troponin i, poc: 0.07 ng/mL (ref 0.00–0.08)

## 2017-02-20 LAB — CBC WITH DIFFERENTIAL/PLATELET
Basophils Absolute: 0 10*3/uL (ref 0.0–0.1)
Basophils Relative: 0 %
Eosinophils Absolute: 0.1 10*3/uL (ref 0.0–0.7)
Eosinophils Relative: 1 %
HCT: 35.9 % — ABNORMAL LOW (ref 39.0–52.0)
Hemoglobin: 12.6 g/dL — ABNORMAL LOW (ref 13.0–17.0)
Lymphocytes Relative: 31 %
Lymphs Abs: 3.6 10*3/uL (ref 0.7–4.0)
MCH: 29.7 pg (ref 26.0–34.0)
MCHC: 35.1 g/dL (ref 30.0–36.0)
MCV: 84.7 fL (ref 78.0–100.0)
Monocytes Absolute: 0.7 10*3/uL (ref 0.1–1.0)
Monocytes Relative: 6 %
Neutro Abs: 7.3 10*3/uL (ref 1.7–7.7)
Neutrophils Relative %: 62 %
Platelets: 74 10*3/uL — ABNORMAL LOW (ref 150–400)
RBC: 4.24 MIL/uL (ref 4.22–5.81)
RDW: 17.2 % — ABNORMAL HIGH (ref 11.5–15.5)
WBC: 12 10*3/uL — ABNORMAL HIGH (ref 4.0–10.5)

## 2017-02-20 LAB — I-STAT CHEM 8, ED
BUN: 44 mg/dL — ABNORMAL HIGH (ref 6–20)
Calcium, Ion: 0.81 mmol/L — CL (ref 1.15–1.40)
Chloride: 90 mmol/L — ABNORMAL LOW (ref 101–111)
Creatinine, Ser: 10.1 mg/dL — ABNORMAL HIGH (ref 0.61–1.24)
Glucose, Bld: 37 mg/dL — CL (ref 65–99)
HCT: 41 % (ref 39.0–52.0)
Hemoglobin: 13.9 g/dL (ref 13.0–17.0)
Potassium: 5.7 mmol/L — ABNORMAL HIGH (ref 3.5–5.1)
Sodium: 131 mmol/L — ABNORMAL LOW (ref 135–145)
TCO2: 27 mmol/L (ref 0–100)

## 2017-02-20 LAB — MAGNESIUM: Magnesium: 2.8 mg/dL — ABNORMAL HIGH (ref 1.7–2.4)

## 2017-02-20 LAB — MRSA PCR SCREENING: MRSA by PCR: NEGATIVE

## 2017-02-20 LAB — CBG MONITORING, ED: Glucose-Capillary: 101 mg/dL — ABNORMAL HIGH (ref 65–99)

## 2017-02-20 LAB — BRAIN NATRIURETIC PEPTIDE: B Natriuretic Peptide: 3193.8 pg/mL — ABNORMAL HIGH (ref 0.0–100.0)

## 2017-02-20 LAB — GLUCOSE, CAPILLARY: Glucose-Capillary: 120 mg/dL — ABNORMAL HIGH (ref 65–99)

## 2017-02-20 MED ORDER — DEXTROSE 50 % IV SOLN
1.0000 | Freq: Once | INTRAVENOUS | Status: AC
  Administered 2017-02-20: 50 mL via INTRAVENOUS
  Filled 2017-02-20: qty 50

## 2017-02-20 MED ORDER — CALCIUM GLUCONATE 10 % IV SOLN
1.0000 g | Freq: Once | INTRAVENOUS | Status: DC
  Filled 2017-02-20: qty 10

## 2017-02-20 MED ORDER — ONDANSETRON HCL 4 MG/2ML IJ SOLN: 4.0000 mg | Freq: Four times a day (QID) | INTRAMUSCULAR | Status: DC | PRN

## 2017-02-20 MED ORDER — ACETAMINOPHEN 325 MG PO TABS: 650.0000 mg | ORAL_TABLET | Freq: Four times a day (QID) | ORAL | Status: DC | PRN

## 2017-02-20 MED ORDER — MINOXIDIL 10 MG PO TABS
10.0000 mg | ORAL_TABLET | Freq: Every day | ORAL | Status: DC
  Administered 2017-02-20: 10 mg via ORAL
  Filled 2017-02-20 (×2): qty 1

## 2017-02-20 MED ORDER — GABAPENTIN 100 MG PO CAPS
100.0000 mg | ORAL_CAPSULE | Freq: Three times a day (TID) | ORAL | Status: DC
  Administered 2017-02-20 – 2017-02-23 (×6): 100 mg via ORAL
  Filled 2017-02-20 (×6): qty 1

## 2017-02-20 MED ORDER — SEVELAMER CARBONATE 800 MG PO TABS
2400.0000 mg | ORAL_TABLET | Freq: Three times a day (TID) | ORAL | Status: DC
  Administered 2017-02-20 – 2017-02-23 (×6): 2400 mg via ORAL
  Filled 2017-02-20 (×7): qty 3

## 2017-02-20 MED ORDER — SENNOSIDES-DOCUSATE SODIUM 8.6-50 MG PO TABS
1.0000 | ORAL_TABLET | Freq: Every evening | ORAL | Status: DC | PRN
  Filled 2017-02-20: qty 1

## 2017-02-20 MED ORDER — ACETAMINOPHEN 650 MG RE SUPP: 650.0000 mg | Freq: Four times a day (QID) | RECTAL | Status: DC | PRN

## 2017-02-20 MED ORDER — PROMETHAZINE HCL 25 MG/ML IJ SOLN
25.0000 mg | Freq: Once | INTRAMUSCULAR | Status: AC
  Administered 2017-02-20: 25 mg via INTRAMUSCULAR
  Filled 2017-02-20: qty 1

## 2017-02-20 MED ORDER — HYDROCODONE-ACETAMINOPHEN 5-325 MG PO TABS
1.0000 | ORAL_TABLET | Freq: Four times a day (QID) | ORAL | Status: DC | PRN
  Administered 2017-02-22 – 2017-02-23 (×2): 1 via ORAL
  Filled 2017-02-20 (×3): qty 1

## 2017-02-20 MED ORDER — CALCIUM CHLORIDE 10 % IV SOLN
1.0000 g | Freq: Once | INTRAVENOUS | Status: AC
  Administered 2017-02-20: 1 g via INTRAVENOUS

## 2017-02-20 MED ORDER — ONDANSETRON HCL 4 MG PO TABS: 4.0000 mg | ORAL_TABLET | Freq: Four times a day (QID) | ORAL | Status: DC | PRN

## 2017-02-20 MED ORDER — LEVALBUTEROL HCL 0.63 MG/3ML IN NEBU: 0.6300 mg | INHALATION_SOLUTION | Freq: Four times a day (QID) | RESPIRATORY_TRACT | Status: DC | PRN

## 2017-02-20 MED ORDER — DILTIAZEM HCL 25 MG/5ML IV SOLN
10.0000 mg | Freq: Once | INTRAVENOUS | Status: AC
  Administered 2017-02-20: 10 mg via INTRAVENOUS
  Filled 2017-02-20: qty 5

## 2017-02-20 MED ORDER — METOPROLOL TARTRATE 5 MG/5ML IV SOLN
5.0000 mg | Freq: Once | INTRAVENOUS | Status: AC
  Administered 2017-02-20: 5 mg via INTRAVENOUS
  Filled 2017-02-20: qty 5

## 2017-02-20 MED ORDER — SODIUM POLYSTYRENE SULFONATE 15 GM/60ML PO SUSP
45.0000 g | Freq: Once | ORAL | Status: AC
  Administered 2017-02-20: 45 g via ORAL
  Filled 2017-02-20: qty 180

## 2017-02-20 MED ORDER — SEVELAMER CARBONATE 800 MG PO TABS: 2400.0000 mg | ORAL_TABLET | Freq: Three times a day (TID) | ORAL | Status: DC

## 2017-02-20 NOTE — H&P (Signed)
History and Physical    [name]:[id number] DOB: Nov 25, 1960 DOA: 02/20/2017   PCP: [name], MD   Patient coming from:  Home    Chief Complaint: Shortness of breath   HPI: [name] is a 56 y.o. male with medical history significant for HTN, ESRD on HD MWF, d HF, anxiety, HCV and Hep B virus  Not on meds, medicine non compliance, tobacco and ETOH abuse, seen yesterday at the ED for SOB felt to be due to COPD exacerbation, now presenting with worsening SOB , persistent, diaphoretic, and describing chest tightness. He denies any productive cough. No fever or chills. Denies sick contacts. He reports feeling that the "fluid is accumulating". He denies any abdominal, or leg swelling. He reports increased anxiety lately. He does partake ETOH . He continues to smoke. Denies any nausea or vomiting. He does have diarrhea as he has been taking laxatives until yesterday. HE does not make urine Denies any rashes   ED Course:  BP (!) 155/117   Pulse 74   Resp (!) 24   Ht 5\' 9"  (1.753 m)   Wt 72.6 kg (160 lb)   SpO2 98%   BMI 23.63 kg/m    sodium 134 potassium 6 glucose 40 creatinine 10.23 calcium 8.9  magnesium 2.8 alkaline phosphatase 160 albumin 4.2 AST 44 ALT 13 total protein 8.1 total bilirubin 2.6 troponin 0.07 white count 12 hemoglobin 12.6 platelets 74 chest x-ray Vascular congestion without significant edema EKG with sinus tachycardia and left bundle branch block patient received Cardizem 10 mg IV and Lopressor 5 mg IV, with some improvement of his rate from 150 to the 120s. Cards consulted he also received calcium chloride 1 g 4 left bundle branch block by EDP forces glucose initially adds 40, the patient received D50 1 am, with good improvement at 101. Placed on BiPAP   Review of Systems:  As per HPI otherwise all other systems reviewed and are negative  Past Medical History:  Diagnosis Date  . Adenomatous colon polyp    tubular  . Anemia   . Anxiety    . Arthritis    left shoulder  . Atherosclerosis of aorta ([place])   . Cardiomegaly   . Chest pain    DATE UNKNOWN, C/O PERIODICALLY  . Chronic kidney disease    dialysis - M/W/F  . Cocaine abuse   . COPD exacerbation ([place]) 08/17/2016  . Dialysis patient ([place])    Monday-Wednesday-Friday  . GERD (gastroesophageal reflux disease)    DATE UNKNOWN  . Hemorrhoids   . Hepatitis B, chronic ([place])   . Hepatitis C   . Hyperkalemia   . Hypertension   . Metabolic bone disease    Patient denies  . Nephrolithiasis   . Pneumonia   . Pulmonary edema   . Renal disorder   . Renal insufficiency   . Shortness of breath dyspnea    " for the last past year with this dialysis"  . Tubular adenoma of colon     Past Surgical History:  Procedure Laterality Date  . AV FISTULA PLACEMENT  2012   BELIEVED WAS PLACED IN JUNE  . COLONOSCOPY    . HEMORRHOID BANDING    . LIGATION OF ARTERIOVENOUS  FISTULA Left 0/0/4599   Procedure: Plication of Left Arm Arteriovenous Fistula;  Surgeon: Elam Dutch, MD;  Location: [facility];  Service: Vascular;  Laterality: Left;  . POLYPECTOMY    . REVISON OF ARTERIOVENOUS FISTULA Left 01/20/2016  Procedure: PLICATION OF DISTAL ANEURYSMAL SEGEMENT OF LEFT UPPER ARM ARTERIOVENOUS FISTULA;  Surgeon: Elam Dutch, MD;  Location: [facility];  Service: Vascular;  Laterality: Left;  . REVISON OF ARTERIOVENOUS FISTULA Left 2/77/8242   Procedure: Plication of Left Upper Arm Fistula ;  Surgeon: Waynetta Sandy, MD;  Location[facility];  Service: Vascular;  Laterality: Left;    Social History Social History   Social History  . Marital status: Single    Spouse name: N/A  . Number of children: 3  . Years of education: 10   Occupational History  . Unemployed    Social History Main Topics  . Smoking status: Current Every Day Smoker    Packs/day: 1.00    Years: 43.00    Types: Cigarettes  . Smokeless tobacco: Never Used     Comment: 2-3 cigarettes per day  .  Alcohol use 2.4 oz/week    1 Glasses of wine, 1 Cans of beer, 1 Shots of liquor, 1 Standard drinks or equivalent per week     Comment: occasional   . Drug use: Yes    Types: Marijuana, Cocaine  . Sexual activity: Not on file     Comment: "NOT LATELY" on cocaine   Other Topics Concern  . Not on file   Social History Narrative   Lives alone   Caffeine use: Coffee-rare   Soda- daily        Allergies  Allergen Reactions  . Aspirin Other (See Comments)    STOMACH PAIN  . Clonidine Derivatives Itching  . Tramadol Itching    Family History  Problem Relation Age of Onset  . Heart disease Mother   . Lung cancer Mother   . Heart disease Father   . Malignant hyperthermia Father   . Throat cancer Sister   . Esophageal cancer Sister   . Hypertension Other   . COPD Other   . Colon cancer Neg Hx   . Colon polyps Neg Hx   . Rectal cancer Neg Hx   . Stomach cancer Neg Hx       Prior to Admission medications   Medication Sig Start Date End Date Taking? Authorizing Provider  acetaminophen (TYLENOL) 500 MG tablet Take 500-1,000 mg by mouth every 6 (six) hours as needed (for headaches and pain).    [provider]  albuterol (PROVENTIL HFA;VENTOLIN HFA) 108 (90 Base) MCG/ACT inhaler Inhale 2 puffs into the lungs every 6 (six) hours as needed for wheezing or shortness of breath.     [provider]  budesonide-formoterol (SYMBICORT) 160-4.5 MCG/ACT inhaler Inhale 2 puffs into the lungs 2 (two) times daily.    [provider]  gabapentin (NEURONTIN) 100 MG capsule Take 100 mg by mouth up to two times a day 01/06/17   [provider]  HYDROcodone-acetaminophen (NORCO/VICODIN) 5-325 MG tablet Take 1 tablet by mouth every 6 (six) hours as needed for moderate pain. 01/14/17   Milton Ferguson, MD  minoxidil (LONITEN) 10 MG tablet Take 10 mg by mouth daily.    [provider]  oxyCODONE-acetaminophen (PERCOCET) 5-325 MG tablet Take 1 tablet by mouth  every 6 (six) hours as needed. 01/14/17   Milton Ferguson, MD  polyethylene glycol powder (GLYCOLAX/MIRALAX) powder Take 17grams (1 capful) dissolved in at least 8 ounces of water or juice, once daily 01/13/17   Pyrtle, Lajuan Lines, MD  sevelamer carbonate (RENVELA) 800 MG tablet Take 2,400 mg by mouth 3 (three) times daily with meals.  [provider]    Physical Exam:  Vitals:   02/20/17 0945 02/20/17 1000 02/20/17 1100 02/20/17 1130  BP: (!) 133/103 (!) 154/102 (!) 176/101 (!) 155/117  Pulse: (!) 123  70 74  Resp: (!) 22 (!) 39 (!) 21 (!) 24  SpO2: 100%  97% 98%  Weight:      Height:       Constitutional: uncomfortable, anxious, moderate degree of distress  Eyes: PERRL, lids and conjunctivae normal ENMT: Mucous membranes are moist, without exudate or lesions  Neck: normal, supple, no masses, no thyromegaly Respiratory: bibasilar crackles, decreased breath sounds, no wheezing or rhonchi   Cardiovascular:  Tachycardic no murmurs, rubs or gallops. No extremity edema. 2+ pedal pulses. No carotid bruits. Left AVF  Abdomen: Soft, non tender, No hepatosplenomegaly. Bowel sounds positive.  Musculoskeletal: no clubbing / cyanosis. Moves all extremities Skin: no jaundice, No lesions.  Neurologic: Sensation intact  Strength equal in all extremities Psychiatric:   Alert and oriented x 3. Anxious mood    Labs on Admission: I have personally reviewed following labs and imaging studies  CBC:  Recent Labs Lab 02/19/17 1550 02/20/17 0844 02/20/17 0845  WBC 8.1  --  12.0*  NEUTROABS  --   --  7.3  HGB 11.8* 13.9 12.6*  HCT 34.1* 41.0 35.9*  MCV 84.8  --  84.7  PLT 59*  --  74*    Basic Metabolic Panel:  Recent Labs Lab 02/15/17 1425 02/19/17 1550 02/20/17 0844 02/20/17 0845 02/20/17 1019  NA 137 131* 131* 134*  --   K 4.0 5.1 5.7* 6.0*  --   CL 90* 88* 90* 87*  --   CO2 30 26  --  23  --   GLUCOSE 97 87 37* 40*  --   BUN 25 29* 44* 37*  --   CREATININE 9.18* 8.62* 10.10*  10.23*  --   CALCIUM 9.0 8.1*  --  8.9  --   MG  --   --   --   --  2.8*    GFR: Estimated Creatinine Clearance: 8.3 mL/min (A) (by C-G formula based on SCr of 10.23 mg/dL (H)).  Liver Function Tests:  Recent Labs Lab 02/15/17 1425 02/20/17 0845  AST 16 44*  ALT 8* 13*  ALKPHOS 166* 160*  BILITOT 0.9 2.6*  PROT 7.3 8.1  ALBUMIN 4.4 4.2   No results for input(s): LIPASE, AMYLASE in the last 168 hours. No results for input(s): AMMONIA in the last 168 hours.  Coagulation Profile: No results for input(s): INR, PROTIME in the last 168 hours.  Cardiac Enzymes: No results for input(s): CKTOTAL, CKMB, CKMBINDEX, TROPONINI in the last 168 hours.  BNP (last 3 results) No results for input(s): PROBNP in the last 8760 hours.  HbA1C: No results for input(s): HGBA1C in the last 72 hours.  CBG:  Recent Labs Lab 02/20/17 0911  GLUCAP 101*    Lipid Profile: No results for input(s): CHOL, HDL, LDLCALC, TRIG, CHOLHDL, LDLDIRECT in the last 72 hours.  Thyroid Function Tests: No results for input(s): TSH, T4TOTAL, FREET4, T3FREE, THYROIDAB in the last 72 hours.  Anemia Panel: No results for input(s): VITAMINB12, FOLATE, FERRITIN, TIBC, IRON, RETICCTPCT in the last 72 hours.  Urine analysis:    Component Value Date/Time   COLORURINE YELLOW 07/16/2011 1619   APPEARANCEUR CLEAR 07/16/2011 1619   LABSPEC 1.018 07/16/2011 1619   PHURINE 7.5 07/16/2011 1619   GLUCOSEU 250 (A) 07/16/2011 1619   HGBUR MODERATE (A)  07/16/2011 1619   BILIRUBINUR SMALL (A) 07/16/2011 1619   KETONESUR NEGATIVE 07/16/2011 1619   PROTEINUR >300 (A) 07/16/2011 1619   UROBILINOGEN 1.0 07/16/2011 1619   NITRITE NEGATIVE 07/16/2011 1619   LEUKOCYTESUR SMALL (A) 07/16/2011 1619    Sepsis Labs: @LABRCNTIP (procalcitonin:4,lacticidven:4) )No results found for this or any previous visit (from the past 240 hour(s)).   Radiological Exams on Admission: Dg Chest Portable 1 View  Result Date:  02/20/2017 CLINICAL DATA:  Shortness of Breath EXAM: PORTABLE CHEST 1 VIEW COMPARISON:  02/19/2017 FINDINGS: Cardiac shadow remains enlarged. Aortic calcifications are again. Increasing vascular congestion is noted without significant interstitial edema. No focal infiltrate is noted. IMPRESSION: Vascular congestion without significant edema. Electronically Signed   By: Inez Catalina M.D.   On: 02/20/2017 09:03   Dg Chest Port 1 View  Result Date: 02/19/2017 CLINICAL DATA:  Short of breath.  Dialysis patient EXAM: PORTABLE CHEST 1 VIEW COMPARISON:  01/14/2017 FINDINGS: Cardiac enlargement with mild vascular congestion. Negative for edema or effusion. Mild left lower lobe atelectasis unchanged. Atherosclerotic aortic arch IMPRESSION: Cardiac enlargement with mild vascular congestion. Negative for edema Mild left lower lobe atelectasis Electronically Signed   By: Franchot Gallo M.D.   On: 02/19/2017 16:02    EKG: Independently reviewed.  Assessment/Plan Active Problems:   Fluid overload   Alcohol abuse   Cigarette smoker   Chronic hepatitis B ([facility])   Chronic hepatitis C without hepatic coma ([facility])   Thrombocytopenia ([facility])   End stage renal disease ([facility])   COPD GOLD II/ still smoking   Essential hypertension   Acute Respiratory Failure with hypoxia and COPD exacerbation in the setting of acute on Chronic CHF, complicated by pulmonary edema with volume overload. Patient with h/o  ESRD  Last 2 D echo in 2011 EF 55-60 Gr2 DD and Nl Systolic showed  EKG ST without sign changes from prior (see below)   Tn 0.07   Current weight 160 lbs  Afebrile. CXR with vascular congestion  Currently on NRB, trying to wean off  Admit to SDU  Continuous pulse ox Continue BiPAp, try to wean off Continue OP meds after dialysis -monitor I/Os -daily weights -prn 02 2 D echo BNP  Xopenex nebs q 6 prn due to severe tachycardia   Respiratory therapy consult CXR in am   Tachycardia, rate up to the 150s, in the  setting of fluid overload and CHF. EKG ST w LBBB. Reports chest tightness but not discrete chest pain  Received IV Cardizem with some adequate response but not significantly improved  Also received Ca Chloride injection for the LBBB Cardiology to see, appreciate involvement  EKG prn  CYcle troponin   Leukocytosis, likely reactive, related to underlying dyspnea  WBC  12    Repeat CBC in AM Blood culture    ESRD on HD MWF  Cr 10.23, BL 5. Nephrology involved. Dr. Jonnie Finner is to proceed with HD tomorrow Renal Diet. Other plans as per Nephrology Check CMET in am Continue Renvela in am after HD Appreciate Renal involvement  Hypertension BP  183/75   Pulse 98   Patient non compliant Resume  anti-hypertensive medications as no HD today, hold tomorrow anticipating dialysis  Add Hydralazine Q6 hours as needed for BP 160/90  Other plans as per Cards    Anemia of chronic disease Hemoglobin on admission 12.6, stable   Repeat CBC in am  No transfusion is indicated at this time   Thrombocytopenia Likely due to Hep C, ETOH, currently  at 47 (was 59 day prior)  No transfusion is indicated at this time Monitor counts closely Transfuse 1 unit of platelets if count is less or equal than 10,000 or 20,000 if the patient is acutely bleeding Hold Heparin  if  platelets drop to less than 50,000   Chronic Hep C and B  Not on meds, can follow as OP. No ascites  On exam. No jaundice noted. Bili 2.6.  No apparent acute issues    Alcohol abuse and dependence and at risk for withdrawal Telemetry   CIWA with Ativan per protocol   Thiamine, folate, and MVI Continue neurontin   Hypoglycemia, received D50 1 amp at the ED,with improvement, currently 101 Monitor sugars closely   Tobacco abuse with nicotine withdrawal -  Nicotine patch vs gum was offered, patient declined  -  Counseled cessation  DVT prophylaxis:Heparin  Code Status:   Full     Family Communication:  Discussed with  patient Disposition Plan: Expect patient to be discharged to home after condition improves Consults called:    Cardiology and Renal as per EDP Admission status: SDU    [facility] E, PA-C [facility]   02/20/2017, 11:56 AM

## 2017-02-20 NOTE — ED Provider Notes (Signed)
[facility] DEPT Provider Note   CSN: [id number] Arrival date & time: 02/20/17  4098     History   Chief Complaint Chief Complaint  Patient presents with  . Shortness of Breath    HPI [name] is a 56 y.o. male.  Patient presents to the emergency department with onset of shortness of breath this morning. Patient has a history of end stage renal disease, afib. He dialyzes on Monday, Wednesday, and Friday. Last dialyzed 2 days ago. Has some mild vague chest tightness as well. No fevers, cough, vomiting, diarrhea. Seen in ED yesterday for SOB, treated for COPD. The onset of this condition was acute. The course is constant. Aggravating factors: none. Alleviating factors: none.        Past Medical History:  Diagnosis Date  . Adenomatous colon polyp    tubular  . Anemia   . Anxiety   . Arthritis    left shoulder  . Atherosclerosis of aorta ([place])   . Cardiomegaly   . Chest pain    DATE UNKNOWN, C/O PERIODICALLY  . Chronic kidney disease    dialysis - M/W/F  . Cocaine abuse   . COPD exacerbation ([place]) 08/17/2016  . Dialysis patient ([place])    Monday-Wednesday-Friday  . GERD (gastroesophageal reflux disease)    DATE UNKNOWN  . Hemorrhoids   . Hepatitis B, chronic ([place])   . Hepatitis C   . Hyperkalemia   . Hypertension   . Metabolic bone disease    Patient denies  . Nephrolithiasis   . Pneumonia   . Pulmonary edema   . Renal disorder   . Renal insufficiency   . Shortness of breath dyspnea    " for the last past year with this dialysis"  . Tubular adenoma of colon     Patient Active Problem List   Diagnosis Date Noted  . COPD GOLD II/ still smoking 09/27/2016  . Essential hypertension 09/27/2016  . Fluid overload 08/30/2016  . Acute respiratory failure with hypoxia ([place]) 08/17/2016  . COPD exacerbation ([place]) 08/17/2016  . Hypertensive urgency 08/17/2016  . Acute bronchitis 08/17/2016  . Elevated troponin 08/17/2016  . Respiratory failure ([place])  08/17/2016  . Problem with dialysis access ([place]) 07/23/2016  . End stage renal disease ([place]) 12/02/2015  . Chronic hepatitis B ([place]) 03/05/2014  . Chronic hepatitis C without hepatic coma ([place]) 03/05/2014  . Internal hemorrhoids with bleeding, swelling and itching 03/05/2014  . Thrombocytopenia ([place]) 03/05/2014  . Chest pain 02/27/2014  . Alcohol abuse 04/14/2009  . Cigarette smoker 04/14/2009  . GANGLION CYST 04/14/2009    Past Surgical History:  Procedure Laterality Date  . AV FISTULA PLACEMENT  2012   BELIEVED WAS PLACED IN JUNE  . COLONOSCOPY    . HEMORRHOID BANDING    . LIGATION OF ARTERIOVENOUS  FISTULA Left 07/12/9145   Procedure: Plication of Left Arm Arteriovenous Fistula;  Surgeon: Elam Dutch, MD;  Location: [facility];  Service: Vascular;  Laterality: Left;  . POLYPECTOMY    . REVISON OF ARTERIOVENOUS FISTULA Left 03/09/5620   Procedure: PLICATION OF DISTAL ANEURYSMAL SEGEMENT OF LEFT UPPER ARM ARTERIOVENOUS FISTULA;  Surgeon: Elam Dutch, MD;  Location: [facility];  Service: Vascular;  Laterality: Left;  . REVISON OF ARTERIOVENOUS FISTULA Left 09/16/6576   Procedure: Plication of Left Upper Arm Fistula ;  Surgeon: Waynetta Sandy, MD;  Location: [facility];  Service: Vascular;  Laterality: Left;       Home Medications    Prior  to Admission medications   Medication Sig Start Date End Date Taking? Authorizing Provider  acetaminophen (TYLENOL) 500 MG tablet Take 500-1,000 mg by mouth every 6 (six) hours as needed (for headaches and pain).    [provider]  albuterol (PROVENTIL HFA;VENTOLIN HFA) 108 (90 Base) MCG/ACT inhaler Inhale 2 puffs into the lungs every 6 (six) hours as needed for wheezing or shortness of breath.     [provider]  budesonide-formoterol (SYMBICORT) 160-4.5 MCG/ACT inhaler Inhale 2 puffs into the lungs 2 (two) times daily.    [provider]  gabapentin (NEURONTIN) 100 MG capsule Take 100 mg by mouth up to two times  a day 01/06/17   [provider]  HYDROcodone-acetaminophen (NORCO/VICODIN) 5-325 MG tablet Take 1 tablet by mouth every 6 (six) hours as needed for moderate pain. 01/14/17   Milton Ferguson, MD  minoxidil (LONITEN) 10 MG tablet Take 10 mg by mouth daily.    [provider]  oxyCODONE-acetaminophen (PERCOCET) 5-325 MG tablet Take 1 tablet by mouth every 6 (six) hours as needed. 01/14/17   Milton Ferguson, MD  polyethylene glycol powder (GLYCOLAX/MIRALAX) powder Take 17grams (1 capful) dissolved in at least 8 ounces of water or juice, once daily 01/13/17   Pyrtle, Lajuan Lines, MD  sevelamer carbonate (RENVELA) 800 MG tablet Take 2,400 mg by mouth 3 (three) times daily with meals.    [provider]    Family History Family History  Problem Relation Age of Onset  . Heart disease Mother   . Lung cancer Mother   . Heart disease Father   . Malignant hyperthermia Father   . Throat cancer Sister   . Esophageal cancer Sister   . Hypertension Other   . COPD Other   . Colon cancer Neg Hx   . Colon polyps Neg Hx   . Rectal cancer Neg Hx   . Stomach cancer Neg Hx     Social History Social History  Substance Use Topics  . Smoking status: Current Every Day Smoker    Packs/day: 1.00    Years: 43.00    Types: Cigarettes  . Smokeless tobacco: Never Used     Comment: 2-3 cigarettes per day  . Alcohol use 2.4 oz/week    1 Glasses of wine, 1 Cans of beer, 1 Shots of liquor, 1 Standard drinks or equivalent per week     Comment: occasional      Allergies   Aspirin; Clonidine derivatives; and Tramadol   Review of Systems Review of Systems  Constitutional: Positive for diaphoresis. Negative for fever.  HENT: Negative for rhinorrhea and sore throat.   Eyes: Negative for redness.  Respiratory: Positive for shortness of breath and wheezing. Negative for cough.   Cardiovascular: Positive for chest pain. Negative for leg swelling.  Gastrointestinal: Negative for abdominal pain,  diarrhea, nausea and vomiting.  Genitourinary: Negative for dysuria.  Musculoskeletal: Negative for myalgias.  Skin: Negative for rash.  Neurological: Negative for headaches.     Physical Exam Updated Vital Signs Ht 5\' 9"  (1.753 m)   Wt 72.6 kg (160 lb)   SpO2 99%   BMI 23.63 kg/m   Physical Exam  Constitutional: He appears well-developed and well-nourished.  HENT:  Head: Normocephalic and atraumatic.  Eyes: Conjunctivae are normal. Right eye exhibits no discharge. Left eye exhibits no discharge.  Neck: Normal range of motion. Neck supple.  Cardiovascular: Normal heart sounds.  An irregularly irregular rhythm present. Tachycardia present.   Pulmonary/Chest: Effort normal. Tachypnea noted.  No respiratory distress. He has wheezes (Scattered expiratory wheezing). He has no rales.  Abdominal: Soft. There is no tenderness.  Musculoskeletal: He exhibits no edema.  Neurological: He is alert.  Skin: Skin is warm and dry.  Psychiatric: He has a normal mood and affect.  Nursing note and vitals reviewed.    ED Treatments / Results  Labs (all labs ordered are listed, but only abnormal results are displayed) Labs Reviewed  CBC WITH DIFFERENTIAL/PLATELET - Abnormal; Notable for the following:       Result Value   WBC 12.0 (*)    Hemoglobin 12.6 (*)    HCT 35.9 (*)    RDW 17.2 (*)    Platelets 74 (*)    All other components within normal limits  COMPREHENSIVE METABOLIC PANEL - Abnormal; Notable for the following:    Sodium 134 (*)    Potassium 6.0 (*)    Chloride 87 (*)    Glucose, Bld 40 (*)    BUN 37 (*)    Creatinine, Ser 10.23 (*)    AST 44 (*)    ALT 13 (*)    Alkaline Phosphatase 160 (*)    Total Bilirubin 2.6 (*)    GFR calc non Af Amer 5 (*)    GFR calc Af Amer 6 (*)    Anion gap 24 (*)    All other components within normal limits  MAGNESIUM - Abnormal; Notable for the following:    Magnesium 2.8 (*)    All other components within normal limits  I-STAT CHEM 8,  ED - Abnormal; Notable for the following:    Sodium 131 (*)    Potassium 5.7 (*)    Chloride 90 (*)    BUN 44 (*)    Creatinine, Ser 10.10 (*)    Glucose, Bld 37 (*)    Calcium, Ion 0.81 (*)    All other components within normal limits  CBG MONITORING, ED - Abnormal; Notable for the following:    Glucose-Capillary 101 (*)    All other components within normal limits  I-STAT TROPONIN, ED    EKG  EKG Interpretation None       Radiology Dg Chest Port 1 View  Result Date: 02/19/2017 CLINICAL DATA:  Short of breath.  Dialysis patient EXAM: PORTABLE CHEST 1 VIEW COMPARISON:  01/14/2017 FINDINGS: Cardiac enlargement with mild vascular congestion. Negative for edema or effusion. Mild left lower lobe atelectasis unchanged. Atherosclerotic aortic arch IMPRESSION: Cardiac enlargement with mild vascular congestion. Negative for edema Mild left lower lobe atelectasis Electronically Signed   By: Franchot Gallo M.D.   On: 02/19/2017 16:02    Procedures Procedures (including critical care time)  Medications Ordered in ED Medications  HYDROcodone-acetaminophen (NORCO/VICODIN) 5-325 MG per tablet 1 tablet (not administered)  sevelamer carbonate (RENVELA) tablet 2,400 mg (not administered)  gabapentin (NEURONTIN) capsule 100 mg (not administered)  minoxidil (LONITEN) tablet 10 mg (not administered)  acetaminophen (TYLENOL) tablet 650 mg (not administered)    Or  acetaminophen (TYLENOL) suppository 650 mg (not administered)  senna-docusate (Senokot-S) tablet 1 tablet (not administered)  ondansetron (ZOFRAN) tablet 4 mg (not administered)    Or  ondansetron (ZOFRAN) injection 4 mg (not administered)  levalbuterol (XOPENEX) nebulizer solution 0.63 mg (not administered)  metoprolol tartrate (LOPRESSOR) injection 5 mg (not administered)  calcium chloride injection 1 g (1 g Intravenous Given 02/20/17 0838)  promethazine (PHENERGAN) injection 25 mg (25 mg Intramuscular Given by Other 02/20/17  0848)  dextrose 50 % solution 50 mL (  50 mLs Intravenous Given by Other 02/20/17 0849)  diltiazem (CARDIZEM) injection 10 mg (10 mg Intravenous Given 02/20/17 1016)     Initial Impression / Assessment and Plan / ED Course  I have reviewed the triage vital signs and the nursing notes.  Pertinent labs & imaging results that were available during my care of the patient were reviewed by me and considered in my medical decision making (see chart for details).     Patient seen and examined. Work-up initiated. Medications ordered.   Vital signs reviewed and are as follows: Ht 5\' 9"  (1.753 m)   Wt 72.6 kg (160 lb)   SpO2 99%   BMI 23.63 kg/m   Pt with wide-complex tachycardia, previous LBBB. Seen with Dr. Canary Brim. Calcium ordered. Istat chem 8 ordered to obtain potassium.   8:47 AM Istat: K=5.7. Glucose=37, D50 ordered. Low ionized Ca 0.81, treated.   9:08 AM Bipap started. Pt resting, appears more comfortable. HR 125-145.   10:13 AM Pt stable. HR remains variable. D/w Dr. Canary Brim. Will give 10mg  cardizem. CXR with mild failure.   12:08 PM Pt responded to cardizem bolus. Trial off bipap.   Spoke with hospitalist who will admit to stepdown.   Spoke with Dr. Jonnie Finner who will see.   Spoke with Dr. Einar Gip. Will consult tonight or tomorrow. States give IV metoprolol 5mg  now, start 50mg  bid.    CRITICAL CARE Performed by: Faustino Congress Total critical care time: 50 minutes Critical care time was exclusive of separately billable procedures and treating other patients. Critical care was necessary to treat or prevent imminent or life-threatening deterioration. Critical care was time spent personally by me on the following activities: development of treatment plan with patient and/or surrogate as well as nursing, discussions with consultants, evaluation of patient's response to treatment, examination of patient, obtaining history from patient or surrogate, ordering and performing treatments and  interventions, ordering and review of laboratory studies, ordering and review of radiographic studies, pulse oximetry and re-evaluation of patient's condition.   Final Clinical Impressions(s) / ED Diagnoses   Final diagnoses:  Shortness of breath  Rapid atrial fibrillation (HCC)  Hyperkalemia  Hypoglycemia   Admit for above.   New Prescriptions New Prescriptions   No medications on file     Carlisle Cater, Hershal Coria 02/20/17 1221    Mabe, Forbes Cellar, MD 02/20/17 1553

## 2017-02-20 NOTE — Consult Note (Signed)
Renal Service Consult Note [place] 02/20/2017 [place] Requesting Physician:  Dr Si Raider  Reason for Consult:  ESRD pt with SOB and afib/ RVR HPI: The patient is a 56 y.o. year-old with history of HTN, hep B, hep C (treated), COPD, and ESRD on HD MWF presented to ED with SOB.  Found to have afib w/ RVR, heart rates around 150 in the ED today.  CXR showed early CHF.  Asked to see given ESRD.    Pt upset about another interruption and wants to eat his lunch.  Main c/o SOB. No prod cough, fevers.  No abd pain, or GI symptoms.       Home medications: -proventil inhaler/ symbicort inhaler -minoxidil 10 mg qd -neurontin 100 bid/ percocet 5/325 every 6 hr prn/ norco prn -renvela 3 tid ac    Past Medical History  Past Medical History:  Diagnosis Date  . Adenomatous colon polyp    tubular  . Anemia   . Anxiety   . Arthritis    left shoulder  . Atherosclerosis of aorta ([place])   . Cardiomegaly   . Chest pain    DATE UNKNOWN, C/O PERIODICALLY  . Chronic kidney disease    dialysis - M/W/F  . Cocaine abuse   . COPD exacerbation ([place]) 08/17/2016  . Dialysis patient ([place])    Monday-Wednesday-Friday  . GERD (gastroesophageal reflux disease)    DATE UNKNOWN  . Hemorrhoids   . Hepatitis B, chronic ([place])   . Hepatitis C   . Hyperkalemia   . Hypertension   . Metabolic bone disease    Patient denies  . Nephrolithiasis   . Pneumonia   . Pulmonary edema   . Renal disorder   . Renal insufficiency   . Shortness of breath dyspnea    " for the last past year with this dialysis"  . Tubular adenoma of colon    Past Surgical History  Past Surgical History:  Procedure Laterality Date  . AV FISTULA PLACEMENT  2012   BELIEVED WAS PLACED IN JUNE  . COLONOSCOPY    . HEMORRHOID BANDING    . LIGATION OF ARTERIOVENOUS  FISTULA Left 10/18/6757   Procedure: Plication of Left Arm Arteriovenous Fistula;  Surgeon: Elam Dutch, MD;  Location: [facility];   Service: Vascular;  Laterality: Left;  . POLYPECTOMY    . REVISON OF ARTERIOVENOUS FISTULA Left 1/63/8466   Procedure: PLICATION OF DISTAL ANEURYSMAL SEGEMENT OF LEFT UPPER ARM ARTERIOVENOUS FISTULA;  Surgeon: Elam Dutch, MD;  Location: [facility];  Service: Vascular;  Laterality: Left;  . REVISON OF ARTERIOVENOUS FISTULA Left 5/99/3570   Procedure: Plication of Left Upper Arm Fistula ;  Surgeon: Waynetta Sandy, MD;  Location[facility] OR;  Service: Vascular;  Laterality: Left;   Family History  Family History  Problem Relation Age of Onset  . Heart disease Mother   . Lung cancer Mother   . Heart disease Father   . Malignant hyperthermia Father   . Throat cancer Sister   . Esophageal cancer Sister   . Hypertension Other   . COPD Other   . Colon cancer Neg Hx   . Colon polyps Neg Hx   . Rectal cancer Neg Hx   . Stomach cancer Neg Hx    Social History  reports that he has been smoking Cigarettes.  He has a 43.00 pack-year smoking history. He has never used smokeless tobacco. He reports that he drinks about 2.4 oz  of alcohol per week . He reports that he uses drugs, including Marijuana and Cocaine. Allergies  Allergies  Allergen Reactions  . Aspirin Other (See Comments)    STOMACH PAIN  . Clonidine Derivatives Itching  . Tramadol Itching   Home medications Prior to Admission medications   Medication Sig Start Date End Date Taking? Authorizing Provider  acetaminophen (TYLENOL) 500 MG tablet Take 500-1,000 mg by mouth every 6 (six) hours as needed (for headaches and pain).    [provider]  albuterol (PROVENTIL HFA;VENTOLIN HFA) 108 (90 Base) MCG/ACT inhaler Inhale 2 puffs into the lungs every 6 (six) hours as needed for wheezing or shortness of breath.     [provider]  budesonide-formoterol (SYMBICORT) 160-4.5 MCG/ACT inhaler Inhale 2 puffs into the lungs 2 (two) times daily.    [provider]  gabapentin (NEURONTIN) 100 MG capsule Take 100 mg  by mouth up to two times a day 01/06/17   [provider]  HYDROcodone-acetaminophen (NORCO/VICODIN) 5-325 MG tablet Take 1 tablet by mouth every 6 (six) hours as needed for moderate pain. 01/14/17   Milton Ferguson, MD  minoxidil (LONITEN) 10 MG tablet Take 10 mg by mouth daily.    [provider]  oxyCODONE-acetaminophen (PERCOCET) 5-325 MG tablet Take 1 tablet by mouth every 6 (six) hours as needed. 01/14/17   Milton Ferguson, MD  polyethylene glycol powder (GLYCOLAX/MIRALAX) powder Take 17grams (1 capful) dissolved in at least 8 ounces of water or juice, once daily 01/13/17   Pyrtle, Lajuan Lines, MD  sevelamer carbonate (RENVELA) 800 MG tablet Take 2,400 mg by mouth 3 (three) times daily with meals.    [provider]   Liver Function Tests  Recent Labs Lab 02/15/17 1425 02/20/17 0845  AST 16 44*  ALT 8* 13*  ALKPHOS 166* 160*  BILITOT 0.9 2.6*  PROT 7.3 8.1  ALBUMIN 4.4 4.2   No results for input(s): LIPASE, AMYLASE in the last 168 hours. CBC  Recent Labs Lab 02/19/17 1550 02/20/17 0844 02/20/17 0845  WBC 8.1  --  12.0*  NEUTROABS  --   --  7.3  HGB 11.8* 13.9 12.6*  HCT 34.1* 41.0 35.9*  MCV 84.8  --  84.7  PLT 59*  --  74*   Basic Metabolic Panel  Recent Labs Lab 02/15/17 1425 02/19/17 1550 02/20/17 0844 02/20/17 0845  NA 137 131* 131* 134*  K 4.0 5.1 5.7* 6.0*  CL 90* 88* 90* 87*  CO2 30 26  --  23  GLUCOSE 97 87 37* 40*  BUN 25 29* 44* 37*  CREATININE 9.18* 8.62* 10.10* 10.23*  CALCIUM 9.0 8.1*  --  8.9   Iron/TIBC/Ferritin/ %Sat    Component Value Date/Time   IRON 116 06/25/2011 0837   TIBC 277 06/25/2011 0837   FERRITIN 550 (H) 06/25/2011 0837   IRONPCTSAT 42 06/25/2011 0837    Vitals:   02/20/17 1200 02/20/17 1204 02/20/17 1215 02/20/17 1245  BP: (!) 183/75  (!) 175/112 (!) 178/105  Pulse:  98 (!) 108 91  Resp: (!) 22  (!) 21 20  SpO2:  99% 100% 100%  Weight:      Height:       Exam Gen alert , no distress, nasal O2 No  rash, cyanosis or gangrene Sclera anicteric, throat clear  No jvd or bruits Chest clear bilat to the bases Tachy irreg irreg , soft SEM no rg Abd soft ntnd no mass or ascites +bs GU normal male MS  no joint effusions or deformity Ext no LE or UE edema / no wounds or ulcers Neuro is alert, Ox 3 , nf  LUA aneurysmal AVF+bruit    Dialysis: MWF [facility] 4h  2.2 bath  70kg  450/800   Hep 3000  LUA AVF -venofer 50/wk -calcitriol 0.5 ug tiw -mircera 75 every 2 wks, last 8/1 -sensipar 120 mg tiw at hd   Impression: 1   Dyspnea - in patient w/ acute afib w RVR.  Mild CHF on cxr but pt is stable and looks fine on exam.  With high heart rates will hold off on HD at this time and observe.  Usual next HD will be tomorrow.  Have d/w cardiology 2  AFib with RVR - cardiology adjusting meds  3  ESRD on HD MWF 4  Chronic hep B-  Surface Ag + 5  Hx hepatitis C - finished course of Rx earlier this year and had good response 6  HTN - BP's up, taking minoxidil only it appears 7  Vol no gross overload, early CHF on CXR 8  COPD - no wheezing 9  Hyperkalemia - give one dose kayexalate 45 gm   Plan - HD tomorrow.  Can do HD sooner if worsens today.    [name] MD [employer] pager [phone]   02/20/2017, 1:22 PM

## 2017-02-20 NOTE — ED Notes (Addendum)
Pt noted to have episodes of VTach with left sided chest pain, diaphoretic. Dr Canary Brim and PA Josh at bedside. Pt placed on Defib Pads. Pt given calcium as ordered by PA Josh. Pt with dry heaves, phenergan given as ordered.

## 2017-02-20 NOTE — ED Notes (Addendum)
0825 - pt placed on EKG monitor, pt in Afib, going into Vtach/fib. IV established, pt now c/o L sided chest pain. Placed on zoll, labs drawn

## 2017-02-20 NOTE — ED Triage Notes (Addendum)
Pt in from home via [facility] with sob. MWF Dialysis pt, last done on Friday. Seen here yesterday for same, EMS gave 5mg  Albuterol en route. Alert, in Afib 120-148 with RBBB seen on EMS EKG. Denies cp, c/o nausea, slightly diaphoretic

## 2017-02-20 NOTE — ED Notes (Signed)
Dr.Glick and Alecia Lemming PA informed of critical istat 8 lab result. ED-Lab

## 2017-02-20 NOTE — ED Notes (Signed)
Lunch tray ordered 

## 2017-02-21 ENCOUNTER — Inpatient Hospital Stay (HOSPITAL_COMMUNITY): Payer: Medicare Other

## 2017-02-21 ENCOUNTER — Other Ambulatory Visit: Payer: Self-pay | Admitting: Internal Medicine

## 2017-02-21 DIAGNOSIS — J449 Chronic obstructive pulmonary disease, unspecified: Secondary | ICD-10-CM

## 2017-02-21 LAB — COMPREHENSIVE METABOLIC PANEL
ALT: 17 U/L (ref 17–63)
AST: 32 U/L (ref 15–41)
Albumin: 3.8 g/dL (ref 3.5–5.0)
Alkaline Phosphatase: 142 U/L — ABNORMAL HIGH (ref 38–126)
Anion gap: 18 — ABNORMAL HIGH (ref 5–15)
BUN: 48 mg/dL — ABNORMAL HIGH (ref 6–20)
CO2: 27 mmol/L (ref 22–32)
Calcium: 8.3 mg/dL — ABNORMAL LOW (ref 8.9–10.3)
Chloride: 88 mmol/L — ABNORMAL LOW (ref 101–111)
Creatinine, Ser: 11.7 mg/dL — ABNORMAL HIGH (ref 0.61–1.24)
GFR calc Af Amer: 5 mL/min — ABNORMAL LOW (ref >60)
GFR calc non Af Amer: 4 mL/min — ABNORMAL LOW (ref >60)
Glucose, Bld: 106 mg/dL — ABNORMAL HIGH (ref 65–99)
Potassium: 4.3 mmol/L (ref 3.5–5.1)
Sodium: 133 mmol/L — ABNORMAL LOW (ref 135–145)
Total Bilirubin: 1.6 mg/dL — ABNORMAL HIGH (ref 0.3–1.2)
Total Protein: 6.8 g/dL (ref 6.5–8.1)

## 2017-02-21 LAB — ECHOCARDIOGRAM COMPLETE
FS: 36 % (ref 28–44)
Height: 69 in
IVS/LV PW RATIO, ED: 0.87
LA ID, A-P, ES: 44 mm
LA diam end sys: 44 mm
LA diam index: 2.38 cm/m2
LA vol A4C: 88.3 ml
LA vol index: 56.8 mL/m2
LA vol: 105 mL
LV PW d: 15 mm — AB (ref 0.6–1.1)
LVOT MV VTI INDEX: 0.82 cm2/m2
LVOT MV VTI: 1.52
LVOT SV: 99 mL
LVOT VTI: 31.6 cm
LVOT area: 3.14 cm2
LVOT diameter: 20 mm
LVOT peak grad rest: 10 mmHg
LVOT peak vel: 158 cm/s
MV Annulus VTI: 65.2 cm
MV M vel: 120
Mean grad: 8 mmHg
Weight: 2465.62 oz

## 2017-02-21 LAB — CBC
HCT: 32.1 % — ABNORMAL LOW (ref 39.0–52.0)
Hemoglobin: 11.2 g/dL — ABNORMAL LOW (ref 13.0–17.0)
MCH: 29 pg (ref 26.0–34.0)
MCHC: 34.9 g/dL (ref 30.0–36.0)
MCV: 83.2 fL (ref 78.0–100.0)
Platelets: 86 10*3/uL — ABNORMAL LOW (ref 150–400)
RBC: 3.86 MIL/uL — ABNORMAL LOW (ref 4.22–5.81)
RDW: 17.1 % — ABNORMAL HIGH (ref 11.5–15.5)
WBC: 7.8 10*3/uL (ref 4.0–10.5)

## 2017-02-21 LAB — PHOSPHORUS: Phosphorus: 5.1 mg/dL — ABNORMAL HIGH (ref 2.5–4.6)

## 2017-02-21 LAB — PROTIME-INR
INR: 1.64
Prothrombin Time: 19.6 seconds — ABNORMAL HIGH (ref 11.4–15.2)

## 2017-02-21 LAB — GLUCOSE, CAPILLARY: Glucose-Capillary: 127 mg/dL — ABNORMAL HIGH (ref 65–99)

## 2017-02-21 MED ORDER — SODIUM CHLORIDE 0.9 % IV SOLN: 100.0000 mL | INTRAVENOUS | Status: DC | PRN

## 2017-02-21 MED ORDER — LORAZEPAM 2 MG/ML IJ SOLN: 0.0000 mg | Freq: Two times a day (BID) | INTRAMUSCULAR | Status: DC

## 2017-02-21 MED ORDER — HEPARIN SODIUM (PORCINE) 1000 UNIT/ML DIALYSIS: 3000.0000 [IU] | Freq: Once | INTRAMUSCULAR | Status: DC

## 2017-02-21 MED ORDER — LORAZEPAM 2 MG/ML IJ SOLN
1.0000 mg | Freq: Four times a day (QID) | INTRAMUSCULAR | Status: DC | PRN
  Filled 2017-02-21 (×2): qty 1

## 2017-02-21 MED ORDER — LORAZEPAM 0.5 MG PO TABS
1.0000 mg | ORAL_TABLET | Freq: Four times a day (QID) | ORAL | Status: DC | PRN
  Administered 2017-02-22 – 2017-02-23 (×3): 1 mg via ORAL
  Filled 2017-02-21: qty 2
  Filled 2017-02-21: qty 1

## 2017-02-21 MED ORDER — LORAZEPAM 2 MG/ML IJ SOLN
0.0000 mg | Freq: Four times a day (QID) | INTRAMUSCULAR | Status: DC
  Administered 2017-02-21 – 2017-02-22 (×2): 2 mg via INTRAVENOUS
  Administered 2017-02-22 – 2017-02-23 (×2): 1 mg via INTRAVENOUS
  Filled 2017-02-21 (×4): qty 1

## 2017-02-21 MED ORDER — LIDOCAINE-PRILOCAINE 2.5-2.5 % EX CREA: 1.0000 "application " | TOPICAL_CREAM | CUTANEOUS | Status: DC | PRN

## 2017-02-21 MED ORDER — ALTEPLASE 2 MG IJ SOLR: 2.0000 mg | Freq: Once | INTRAMUSCULAR | Status: DC | PRN

## 2017-02-21 MED ORDER — RENA-VITE PO TABS
1.0000 | ORAL_TABLET | Freq: Every day | ORAL | Status: DC
  Administered 2017-02-21 – 2017-02-22 (×2): 1 via ORAL
  Filled 2017-02-21 (×2): qty 1

## 2017-02-21 MED ORDER — MINOXIDIL 2.5 MG PO TABS
5.0000 mg | ORAL_TABLET | Freq: Every day | ORAL | Status: DC | PRN
  Filled 2017-02-21: qty 2

## 2017-02-21 MED ORDER — HEPARIN SODIUM (PORCINE) 1000 UNIT/ML DIALYSIS: 1000.0000 [IU] | INTRAMUSCULAR | Status: DC | PRN

## 2017-02-21 MED ORDER — LIDOCAINE HCL (PF) 1 % IJ SOLN: 5.0000 mL | INTRAMUSCULAR | Status: DC | PRN

## 2017-02-21 MED ORDER — PENTAFLUOROPROP-TETRAFLUOROETH EX AERO: 1.0000 "application " | INHALATION_SPRAY | CUTANEOUS | Status: DC | PRN

## 2017-02-21 NOTE — Progress Notes (Signed)
  Echocardiogram 2D Echocardiogram has been performed.  [name] 02/21/2017, 4:24 PM

## 2017-02-21 NOTE — Consult Note (Signed)
CARDIOLOGY CONSULT NOTE  Patient ID: [name] MRN: [id number] DOB/AGE: 12/18/1960 56 y.o.  Admit date: 02/20/2017 Referring Physician  Si Raider, Ailene Rud, MD Primary Physician:  [name], MD Reason for Consultation  Afib, CHF  HPI: [name]  is a 56 y.o. male with long standing hypertensive cardiomyopathy, end-stage renal disease on hemodialysis, chronic hepatitis B and C, COPD, tobacco and marijuana abuse, admitted with palpitations and shortness of breath.  Patient lives with his nephew. For the last few days, he has had episodes of diarrhea. He had not been eating adequately.  Although he continued to eat alt to alleviate his nausea symptoms.  Yesterday he had worsening of his shortness of breath. He does not recollect how the events that occurred while he   However does endorse palpitations but denies any presyncopal or syncopal episodes.  He denies any chest pain, leg edema. In the ED, he was found to be in episodes atrial fibrillation wit episodes of nonsustained ventricular tachycardia. In the ED, his blood was noted to be as high as 196/100 mmHg. He received IV doses of metoprolol and diltiazem.   This morning I saw the patient in hemodialysis unit.  He is still in atrial fibr controlled ventricularr  Rate of 70-90 bpm.  While on dialysis, his BP is soft around 90/70 mmHg. The goal for UF is -1.6 L. He is already at his dry weight of 70 Kg.    Past Medical History:  Diagnosis Date  . Adenomatous colon polyp    tubular  . Anemia   . Anxiety   . Arthritis    left shoulder  . Atherosclerosis of aorta ([place])   . Cardiomegaly   . Chest pain    DATE UNKNOWN, C/O PERIODICALLY  . Chronic kidney disease    dialysis - M/W/F  . Cocaine abuse   . COPD exacerbation ([place]) 08/17/2016  . Dialysis patient ([place])    Monday-Wednesday-Friday  . GERD (gastroesophageal reflux disease)    DATE UNKNOWN  . Hemorrhoids   . Hepatitis B, chronic ([place])   . Hepatitis C   .  Hyperkalemia   . Hypertension   . Metabolic bone disease    Patient denies  . Nephrolithiasis   . Pneumonia   . Pulmonary edema   . Renal disorder   . Renal insufficiency   . Shortness of breath dyspnea    " for the last past year with this dialysis"  . Tubular adenoma of colon      Past Surgical History:  Procedure Laterality Date  . AV FISTULA PLACEMENT  2012   BELIEVED WAS PLACED IN JUNE  . COLONOSCOPY    . HEMORRHOID BANDING    . LIGATION OF ARTERIOVENOUS  FISTULA Left 08/18/8339   Procedure: Plication of Left Arm Arteriovenous Fistula;  Surgeon: Elam Dutch, MD;  Location: [facility];  Service: Vascular;  Laterality: Left;  . POLYPECTOMY    . REVISON OF ARTERIOVENOUS FISTULA Left 9/62/2297   Procedure: PLICATION OF DISTAL ANEURYSMAL SEGEMENT OF LEFT UPPER ARM ARTERIOVENOUS FISTULA;  Surgeon: Elam Dutch, MD;  Location: [facility];  Service: Vascular;  Laterality: Left;  . REVISON OF ARTERIOVENOUS FISTULA Left 9/89/2119   Procedure: Plication of Left Upper Arm Fistula ;  Surgeon: Waynetta Sandy, MD;  Location: [facility] OR;  Service: Vascular;  Laterality: Left;     Family History  Problem Relation Age of Onset  . Heart disease Mother   . Lung cancer Mother   .  Heart disease Father   . Malignant hyperthermia Father   . Throat cancer Sister   . Esophageal cancer Sister   . Hypertension Other   . COPD Other   . Colon cancer Neg Hx   . Colon polyps Neg Hx   . Rectal cancer Neg Hx   . Stomach cancer Neg Hx      Social History: Social History   Social History  . Marital status: Single    Spouse name: N/A  . Number of children: 3  . Years of education: 10   Occupational History  . Unemployed    Social History Main Topics  . Smoking status: Current Every Day Smoker    Packs/day: 1.00    Years: 43.00    Types: Cigarettes  . Smokeless tobacco: Never Used     Comment: 2-3 cigarettes per day  . Alcohol use 2.4 oz/week    1 Glasses of wine, 1 Cans of beer, 1  Shots of liquor, 1 Standard drinks or equivalent per week     Comment: occasional   . Drug use: Yes    Types: Marijuana, Cocaine  . Sexual activity: Not on file     Comment: "NOT LATELY" on cocaine   Other Topics Concern  . Not on file   Social History Narrative   Lives alone   Caffeine use: Coffee-rare   Soda- daily        Prescriptions Prior to Admission  Medication Sig Dispense Refill Last Dose  . albuterol (PROVENTIL HFA;VENTOLIN HFA) 108 (90 Base) MCG/ACT inhaler Inhale 2 puffs into the lungs every 6 (six) hours as needed for wheezing or shortness of breath.    01/14/2017 at am  . budesonide-formoterol (SYMBICORT) 160-4.5 MCG/ACT inhaler Inhale 2 puffs into the lungs 2 (two) times daily.   01/14/2017 at am  . CHANTIX 0.5 MG tablet Take 0.5 mg by mouth 2 (two) times daily. For thirty days  0   . gabapentin (NEURONTIN) 100 MG capsule Take 100 mg by mouth up to two times a day  0 01/13/2017 at pm  . minoxidil (LONITEN) 10 MG tablet Take 10 mg by mouth daily.   01/14/2017 at am  . oxyCODONE (OXY IR/ROXICODONE) 5 MG immediate release tablet Take 5 mg by mouth 2 (two) times daily as needed for pain.  0   . polyethylene glycol powder (GLYCOLAX/MIRALAX) powder Take 17grams (1 capful) dissolved in at least 8 ounces of water or juice, once daily 527 g 1 01/14/2017 at am  . sevelamer carbonate (RENVELA) 800 MG tablet Take 2,400 mg by mouth 3 (three) times daily with meals.   01/14/2017 at Unknown time  . acetaminophen (TYLENOL) 500 MG tablet Take 500-1,000 mg by mouth every 6 (six) hours as needed (for headaches and pain).   01/14/2017 at 1200  . oxyCODONE-acetaminophen (PERCOCET) 5-325 MG tablet Take 1 tablet by mouth every 6 (six) hours as needed. (Patient not taking: Reported on 02/20/2017) 10 tablet 0 Completed Course at Unknown time     Review of Systems - Negative except the following General ROS: Fatigue, malaise Respiratory ROS: dry coiugh, shortness of breath at rest on  admission Cardiovascular ROS: No chest pain. Endorses palpitations, associated with lightheadedness. No syncope Gastrointestinal ROS: Loose watery stools 2-3/day in last one week Neurological ROS: No syncope. States he does not recollect events in the ED  HEENT: Negative Skin: No pallor, cyanosis   Rest of the 10 point review negative.  Physical Exam: Blood pressure 118/70, pulse  86, temperature 98.4 F (36.9 C), temperature source Oral, resp. rate (!) 21, height 5\' 9"  (1.753 m), weight 70 kg (154 lb 5.2 oz), SpO2 94 %.   General appearance: cooperative, fatigued and no distress Lungs: Bibasilar rales  JVP: Elevated to jaw Chest wall: no tenderness Heart: Irregularly irregular, normal S21, S2, II/VI early sstolic mumrmur at aortic area and apex Abdomen: Mildly distended. Nontender. BS+ Extremities: Warm. No significant peripheral edema. LUE AV graft Pulses: Intact b/l. DP/PT 1+ b/l Neurologic: Grossly normal  Labs:  Lab Results  Component Value Date   WBC 7.8 02/21/2017   HGB 11.2 (L) 02/21/2017   HCT 32.1 (L) 02/21/2017   MCV 83.2 02/21/2017   PLT 86 (L) 02/21/2017    Recent Labs Lab 02/21/17 0235  NA 133*  K 4.3  CL 88*  CO2 27  BUN 48*  CREATININE 11.70*  CALCIUM 8.3*  PROT 6.8  BILITOT 1.6*  ALKPHOS 142*  ALT 17  AST 32  GLUCOSE 106*    Lipid Panel     Component Value Date/Time   CHOL 103 08/17/2016 0134   TRIG 97 08/17/2016 0134   HDL 34 (L) 08/17/2016 0134   CHOLHDL 3.0 08/17/2016 0134   VLDL 19 08/17/2016 0134   LDLCALC 50 08/17/2016 0134    BNP (last 3 results)  Recent Labs  08/18/16 0941 08/29/16 2259 02/20/17 1706  BNP >4,500.0* >4,500.0* 3,193.8*    HEMOGLOBIN A1C Lab Results  Component Value Date   HGBA1C 4.9 08/17/2016   MPG 94 08/17/2016    Cardiac Panel (last 3 results)  Recent Labs  08/17/16 0131 08/17/16 0834 08/17/16 2125  TROPONINI 0.11* 0.22* 0.33*    Lab Results  Component Value Date   CKTOTAL 274 (H)  12/23/2010   CKMB 4.6 (H) 12/23/2010   TROPONINI 0.33 (HH) 08/17/2016     TSH No results for input(s): TSH in the last 8760 hours.  EKG: normal EKG, normal sinus rhythm, unchanged from previous tracings, Afib. LBBB  Reviewed telemetry.  Episodes of atrial fibrillation with RVR and nonsustained VT.  Echo: Pending   Radiology: Dg Chest Portable 1 View  Result Date: 02/20/2017 CLINICAL DATA:  Shortness of Breath EXAM: PORTABLE CHEST 1 VIEW COMPARISON:  02/19/2017 FINDINGS: Cardiac shadow remains enlarged. Aortic calcifications are again. Increasing vascular congestion is noted without significant interstitial edema. No focal infiltrate is noted. IMPRESSION: Vascular congestion without significant edema. Electronically Signed   By: Inez Catalina M.D.   On: 02/20/2017 09:03   Dg Chest Port 1 View  Result Date: 02/19/2017 CLINICAL DATA:  Short of breath.  Dialysis patient EXAM: PORTABLE CHEST 1 VIEW COMPARISON:  01/14/2017 FINDINGS: Cardiac enlargement with mild vascular congestion. Negative for edema or effusion. Mild left lower lobe atelectasis unchanged. Atherosclerotic aortic arch IMPRESSION: Cardiac enlargement with mild vascular congestion. Negative for edema Mild left lower lobe atelectasis Electronically Signed   By: Franchot Gallo M.D.   On: 02/19/2017 16:02    Scheduled Meds: . gabapentin  100 mg Oral TID  . minoxidil  10 mg Oral Daily  . sevelamer carbonate  2,400 mg Oral TID WC   Continuous Infusions: PRN Meds:.acetaminophen **OR** acetaminophen, HYDROcodone-acetaminophen, levalbuterol, ondansetron **OR** ondansetron (ZOFRAN) IV, senna-docusate  CARDIAC STUDIES:  As above  ASSESSMENT AND PLAN:   1. Afib w/RVR and nonsustained VT:     Underlying LBBB. Known hypertensive cardiomyopathy.      It is likely precipitated in the setting of HFpEF exacerbation     Recommend starting beta  blocker metoprolol xL 25 mg.     Can use IV metoprolol 2.5 mg for episodes of RVR, as  tolerated by blood pressure.     CHA2DS2-VASc score 2. Consider anticoagulation. Given lack of safety data in ESRD patients with NOAC, consider warfarin.   2. HFpEF:     Acute on chronic. Although his clinical exam does not reveal significant edema, and he is close to his dry weight, he does have elevated JVP. Hyponatremia seen, which a poor prognostic factor. Recent dietary noncompliance with salt and hypertensive urgency likely culprit. Agree with echocardiogram. We may consider RHC/LHC to establish etiology of his heart failure and aid with hemodynamic assessment and medication changes.  Appreciate Nephrology help. Needs diet and medication compliance.  3. ESRD on HD  4. Hyponatremia  5. Hep B,C  6. Tobacco and marijuana abuse  7. Thrombocytopenia  Thank you for the consult. We will continue to follow the patient with you Please feel free to contact with any questions  Nigel Mormon, MD 02/21/2017, 7:07 AM [facility]. [place] Office: [phone] If no answer Cell [phone]

## 2017-02-21 NOTE — Progress Notes (Signed)
PROGRESS NOTE    KHYAN OATS  WYO:[id number] DOB: Mar 11, 1961 DOA: 02/20/2017 PCP: [name], MD    Brief Narrative: 56 yo male with history of esrd,hep c,hep b not on any meds admitted with chest pain and sob.denies nausea vomiting.had diarhhea for 3 days from taking laxatives.he was tachycardic on admit.he received lopressor and cardizem.treated with bipap for volume overload.he continues to smoke and drink alcohol.   Assessment & Plan:   Active Problems:   Alcohol abuse   Cigarette smoker   Chronic hepatitis B (H[facility])   Chronic hepatitis C without hepatic coma (H[facility])   Thrombocytopenia ([facility])   End stage renal disease ([facility])   Fluid overload   COPD GOLD II/ still smoking   Essential hypertension  Acute respiratory failure secondary to copd and chf.now off bipap.had dialysis today.nephrology following.  ESRD continue HD MWF.  HEP B/HEPC bilirubin 2.6.  Chronic thrombocytopenia secondary to esrd,hepc,etoh,multifactorial.  Hypertension on minoxidil.  Tachycardia resolved.  Leukocytosis resolved.  Tobacco abuse start nicotine 21 mcg.  Alcohol abuse monitor for withdrawal.    DVT prophylaxis: heparin Code Status: full Family Communication: Disposition Plan: home when stable   Consultants: pulmonary,renal  Procedures: none  Antimicrobials: none   Sub feels good  Objective: Vitals:   02/21/17 1045 02/21/17 1100 02/21/17 1105 02/21/17 1320  BP: (!) 75/42 (!) 84/60 (!) 104/48 (!) 144/100  Pulse: 73 (!) 54 64 75  Resp: 17 18 18 16   Temp:   (!) 97.1 F (36.2 C) 97.6 F (36.4 C)  TempSrc:   Oral Axillary  SpO2: 93% 99% 99% 97%  Weight:   69.9 kg (154 lb 1.6 oz)   Height:        Intake/Output Summary (Last 24 hours) at 02/21/17 1535 Last data filed at 02/21/17 1300  Gross per 24 hour  Intake              180 ml  Output              793 ml  Net             -613 ml   Filed Weights   02/21/17 0402 02/21/17 0645 02/21/17 1105  Weight: 70 kg  (154 lb 5.2 oz) 70.3 kg (154 lb 15.7 oz) 69.9 kg (154 lb 1.6 oz)    Examination:  General exam: Appears calm and comfortable  Respiratory system: Clear to auscultation. Respiratory effort normal. Cardiovascular system: S1 & S2 heard, RRR. No JVD, murmurs, rubs, gallops or clicks. No pedal edema. Gastrointestinal system: Abdomen is nondistended, soft and nontender. No organomegaly or masses felt. Normal bowel sounds heard. Central nervous system: Alert and oriented. No focal neurological deficits. Extremities: Symmetric 5 x 5 power. Skin: No rashes, lesions or ulcers Psychiatry: Judgement and insight appear normal. Mood & affect appropriate.     Data Reviewed: I have personally reviewed following labs and imaging studies  CBC:  Recent Labs Lab 02/19/17 1550 02/20/17 0844 02/20/17 0845 02/21/17 0235  WBC 8.1  --  12.0* 7.8  NEUTROABS  --   --  7.3  --   HGB 11.8* 13.9 12.6* 11.2*  HCT 34.1* 41.0 35.9* 32.1*  MCV 84.8  --  84.7 83.2  PLT 59*  --  74* 86*   Basic Metabolic Panel:  Recent Labs Lab 02/15/17 1425 02/19/17 1550 02/20/17 0844 02/20/17 0845 02/20/17 1019 02/21/17 0235  NA 137 131* 131* 134*  --  133*  K 4.0 5.1 5.7* 6.0*  --  4.3  CL 90* 88* 90* 87*  --  88*  CO2 30 26  --  23  --  27  GLUCOSE 97 87 37* 40*  --  106*  BUN 25 29* 44* 37*  --  48*  CREATININE 9.18* 8.62* 10.10* 10.23*  --  11.70*  CALCIUM 9.0 8.1*  --  8.9  --  8.3*  MG  --   --   --   --  2.8*  --   PHOS  --   --   --   --   --  5.1*   GFR: Estimated Creatinine Clearance: 7.1 mL/min (A) (by C-G formula based on SCr of 11.7 mg/dL (H)). Liver Function Tests:  Recent Labs Lab 02/15/17 1425 02/20/17 0845 02/21/17 0235  AST 16 44* 32  ALT 8* 13* 17  ALKPHOS 166* 160* 142*  BILITOT 0.9 2.6* 1.6*  PROT 7.3 8.1 6.8  ALBUMIN 4.4 4.2 3.8   No results for input(s): LIPASE, AMYLASE in the last 168 hours. No results for input(s): AMMONIA in the last 168 hours. Coagulation  Profile:  Recent Labs Lab 02/21/17 0235  INR 1.64   Cardiac Enzymes: No results for input(s): CKTOTAL, CKMB, CKMBINDEX, TROPONINI in the last 168 hours. BNP (last 3 results) No results for input(s): PROBNP in the last 8760 hours. HbA1C: No results for input(s): HGBA1C in the last 72 hours. CBG:  Recent Labs Lab 02/20/17 0911 02/20/17 1716  GLUCAP 101* 120*   Lipid Profile: No results for input(s): CHOL, HDL, LDLCALC, TRIG, CHOLHDL, LDLDIRECT in the last 72 hours. Thyroid Function Tests: No results for input(s): TSH, T4TOTAL, FREET4, T3FREE, THYROIDAB in the last 72 hours. Anemia Panel: No results for input(s): VITAMINB12, FOLATE, FERRITIN, TIBC, IRON, RETICCTPCT in the last 72 hours. Sepsis Labs: No results for input(s): PROCALCITON, LATICACIDVEN in the last 168 hours.  Recent Results (from the past 240 hour(s))  MRSA PCR Screening     Status: None   Collection Time: 02/20/17  9:19 PM  Result Value Ref Range Status   MRSA by PCR NEGATIVE NEGATIVE Final    Comment:        The GeneXpert MRSA Assay (FDA approved for NASAL specimens only), is one component of a comprehensive MRSA colonization surveillance program. It is not intended to diagnose MRSA infection nor to guide or monitor treatment for MRSA infections.          Radiology Studies: X-ray Chest Pa And Lateral  Result Date: 02/21/2017 CLINICAL DATA:  Shortness of Breath EXAM: CHEST  2 VIEW COMPARISON:  February 20, 2017 FINDINGS: There is slight interstitial edema. There is a minimal left pleural effusion. No consolidation. There is cardiomegaly with pulmonary venous hypertension. There is aortic atherosclerosis. There is no evident bone lesion. IMPRESSION: Evidence of a degree of congestive heart failure with cardiomegaly, pulmonary venous hypertension, and mild interstitial edema. There is a small pleural effusion. No consolidation. There is aortic atherosclerosis. Aortic Atherosclerosis (ICD10-I70.0).  Electronically Signed   By: Lowella Grip III M.D.   On: 02/21/2017 14:07   Dg Chest Portable 1 View  Result Date: 02/20/2017 CLINICAL DATA:  Shortness of Breath EXAM: PORTABLE CHEST 1 VIEW COMPARISON:  02/19/2017 FINDINGS: Cardiac shadow remains enlarged. Aortic calcifications are again. Increasing vascular congestion is noted without significant interstitial edema. No focal infiltrate is noted. IMPRESSION: Vascular congestion without significant edema. Electronically Signed   By: Inez Catalina M.D.   On: 02/20/2017 09:03   Dg Chest Port 1 View  Result Date:  02/19/2017 CLINICAL DATA:  Short of breath.  Dialysis patient EXAM: PORTABLE CHEST 1 VIEW COMPARISON:  01/14/2017 FINDINGS: Cardiac enlargement with mild vascular congestion. Negative for edema or effusion. Mild left lower lobe atelectasis unchanged. Atherosclerotic aortic arch IMPRESSION: Cardiac enlargement with mild vascular congestion. Negative for edema Mild left lower lobe atelectasis Electronically Signed   By: Franchot Gallo M.D.   On: 02/19/2017 16:02        Scheduled Meds: . gabapentin  100 mg Oral TID  . multivitamin  1 tablet Oral QHS  . sevelamer carbonate  2,400 mg Oral TID WC   Continuous Infusions:   LOS: 1 day    Time spent:     Georgette Shell, MD [facility] Pager [phone]  If 7PM-7AM, please contact night-coverage [email or web address] Password [facility] 02/21/2017, 3:35 PM

## 2017-02-21 NOTE — Progress Notes (Signed)
Tele reports HR of 139, however it was unsustained. Will continue to monitor

## 2017-02-21 NOTE — Procedures (Signed)
Pt seen on HD.  Ap 140 Vp 210.  HR 80's-90's but still in A fib.  SBP 101.  He takes the minoxidil only PRN at home.  Will dose 5mg  only for SBP >140 for now.  Start renavite and check PO4.  Cardio to see.

## 2017-02-22 ENCOUNTER — Inpatient Hospital Stay (HOSPITAL_COMMUNITY)
Admission: EM | Disposition: A | Discharge status: Home / Self Care | Payer: Self-pay | Source: Home / Self Care | Attending: Internal Medicine

## 2017-02-22 ENCOUNTER — Encounter: Payer: Self-pay | Admitting: Vascular Surgery

## 2017-02-22 HISTORY — PX: LEFT HEART CATH AND CORONARY ANGIOGRAPHY: CATH118249

## 2017-02-22 HISTORY — PX: CORONARY STENT INTERVENTION: CATH118234

## 2017-02-22 LAB — POCT ACTIVATED CLOTTING TIME
Activated Clotting Time: 235 seconds
Activated Clotting Time: 252 seconds

## 2017-02-22 LAB — GLUCOSE, CAPILLARY
Glucose-Capillary: 105 mg/dL — ABNORMAL HIGH (ref 65–99)
Glucose-Capillary: 123 mg/dL — ABNORMAL HIGH (ref 65–99)
Glucose-Capillary: 127 mg/dL — ABNORMAL HIGH (ref 65–99)
Glucose-Capillary: 99 mg/dL (ref 65–99)

## 2017-02-22 LAB — HEPATITIS PANEL, ACUTE
HCV Ab: >11 s/co ratio — ABNORMAL HIGH (ref 0.0–0.9)
Hep A IgM: NEGATIVE
Hep B C IgM: NEGATIVE

## 2017-02-22 LAB — HEPATITIS B SURFACE AG, CONFIRM: HBsAg Confirmation: POSITIVE — AB

## 2017-02-22 SURGERY — LEFT HEART CATH AND CORONARY ANGIOGRAPHY
Anesthesia: LOCAL

## 2017-02-22 MED ORDER — SODIUM CHLORIDE 0.9% FLUSH: 3.0000 mL | Freq: Two times a day (BID) | INTRAVENOUS | Status: DC

## 2017-02-22 MED ORDER — SODIUM CHLORIDE 0.9 % IV SOLN: 250.0000 mL | INTRAVENOUS | Status: DC | PRN

## 2017-02-22 MED ORDER — LIDOCAINE HCL (PF) 1 % IJ SOLN
INTRAMUSCULAR | Status: AC
  Filled 2017-02-22: qty 30

## 2017-02-22 MED ORDER — HEPARIN SODIUM (PORCINE) 1000 UNIT/ML IJ SOLN
INTRAMUSCULAR | Status: AC
Start: 2017-02-22 — End: ?
  Filled 2017-02-22: qty 1

## 2017-02-22 MED ORDER — FENTANYL CITRATE (PF) 100 MCG/2ML IJ SOLN
INTRAMUSCULAR | Status: DC | PRN
  Administered 2017-02-22 (×2): 25 ug via INTRAVENOUS
  Administered 2017-02-22: 50 ug via INTRAVENOUS

## 2017-02-22 MED ORDER — CLOPIDOGREL BISULFATE 75 MG PO TABS
75.0000 mg | ORAL_TABLET | Freq: Every day | ORAL | Status: DC
  Administered 2017-02-23: 75 mg via ORAL
  Filled 2017-02-22: qty 1

## 2017-02-22 MED ORDER — ANGIOPLASTY BOOK
Freq: Once | Status: AC
  Administered 2017-02-22: 22:00:00
  Filled 2017-02-22: qty 1

## 2017-02-22 MED ORDER — DIPHENHYDRAMINE HCL 50 MG/ML IJ SOLN
INTRAMUSCULAR | Status: AC
Start: 2017-02-22 — End: ?
  Filled 2017-02-22: qty 1

## 2017-02-22 MED ORDER — ASPIRIN 81 MG PO CHEW
81.0000 mg | CHEWABLE_TABLET | ORAL | Status: AC
  Administered 2017-02-22: 81 mg via ORAL
  Filled 2017-02-22: qty 1

## 2017-02-22 MED ORDER — MIDAZOLAM HCL 2 MG/2ML IJ SOLN
INTRAMUSCULAR | Status: DC | PRN
  Administered 2017-02-22 (×2): 1 mg via INTRAVENOUS

## 2017-02-22 MED ORDER — FAMOTIDINE IN NACL 20-0.9 MG/50ML-% IV SOLN
INTRAVENOUS | Status: AC
Start: 2017-02-22 — End: ?
  Filled 2017-02-22: qty 50

## 2017-02-22 MED ORDER — FAMOTIDINE IN NACL 20-0.9 MG/50ML-% IV SOLN
INTRAVENOUS | Status: DC | PRN
  Administered 2017-02-22: 20 mg via INTRAVENOUS

## 2017-02-22 MED ORDER — METOPROLOL SUCCINATE ER 25 MG PO TB24
25.0000 mg | ORAL_TABLET | Freq: Every day | ORAL | Status: DC
  Administered 2017-02-23: 12:00:00 25 mg via ORAL
  Filled 2017-02-22: qty 1

## 2017-02-22 MED ORDER — LIDOCAINE-EPINEPHRINE 1 %-1:100000 IJ SOLN
INTRAMUSCULAR | Status: AC
  Filled 2017-02-22: qty 1

## 2017-02-22 MED ORDER — SODIUM CHLORIDE 0.9% FLUSH
3.0000 mL | Freq: Two times a day (BID) | INTRAVENOUS | Status: DC
  Administered 2017-02-22 – 2017-02-23 (×2): 3 mL via INTRAVENOUS

## 2017-02-22 MED ORDER — DIPHENHYDRAMINE HCL 50 MG/ML IJ SOLN
INTRAMUSCULAR | Status: DC | PRN
  Administered 2017-02-22: 25 mg via INTRAVENOUS

## 2017-02-22 MED ORDER — WARFARIN SODIUM 5 MG PO TABS
5.0000 mg | ORAL_TABLET | Freq: Every day | ORAL | Status: DC
  Administered 2017-02-22: 5 mg via ORAL
  Filled 2017-02-22: qty 1

## 2017-02-22 MED ORDER — ACETAMINOPHEN 325 MG PO TABS: 650.0000 mg | ORAL_TABLET | ORAL | Status: DC | PRN

## 2017-02-22 MED ORDER — CLOPIDOGREL BISULFATE 300 MG PO TABS
ORAL_TABLET | ORAL | Status: DC | PRN
  Administered 2017-02-22: 600 mg via ORAL

## 2017-02-22 MED ORDER — SODIUM CHLORIDE 0.9 % IV SOLN: INTRAVENOUS | Status: DC

## 2017-02-22 MED ORDER — HEPARIN SODIUM (PORCINE) 1000 UNIT/ML IJ SOLN
INTRAMUSCULAR | Status: DC | PRN
  Administered 2017-02-22: 8000 [IU] via INTRAVENOUS
  Administered 2017-02-22: 3000 [IU] via INTRAVENOUS

## 2017-02-22 MED ORDER — WARFARIN VIDEO: Freq: Once | Status: DC

## 2017-02-22 MED ORDER — FENTANYL CITRATE (PF) 100 MCG/2ML IJ SOLN
INTRAMUSCULAR | Status: AC
  Filled 2017-02-22: qty 2

## 2017-02-22 MED ORDER — WARFARIN - PHARMACIST DOSING INPATIENT: Freq: Every day | Status: DC

## 2017-02-22 MED ORDER — HEPARIN (PORCINE) IN NACL 2-0.9 UNIT/ML-% IJ SOLN
INTRAMUSCULAR | Status: AC
Start: 2017-02-22 — End: ?
  Filled 2017-02-22: qty 1000

## 2017-02-22 MED ORDER — COUMADIN BOOK
Freq: Once | Status: AC
  Administered 2017-02-22: 20:00:00
  Filled 2017-02-22: qty 1

## 2017-02-22 MED ORDER — FAMOTIDINE IN NACL 20-0.9 MG/50ML-% IV SOLN
INTRAVENOUS | Status: AC
  Filled 2017-02-22: qty 50

## 2017-02-22 MED ORDER — DOXERCALCIFEROL 0.5 MCG PO CAPS
0.5000 ug | ORAL_CAPSULE | ORAL | Status: DC
  Administered 2017-02-23: 0.5 ug via ORAL
  Filled 2017-02-22: qty 1

## 2017-02-22 MED ORDER — LABETALOL HCL 5 MG/ML IV SOLN
INTRAVENOUS | Status: AC
  Filled 2017-02-22: qty 4

## 2017-02-22 MED ORDER — ONDANSETRON HCL 4 MG/2ML IJ SOLN
4.0000 mg | Freq: Four times a day (QID) | INTRAMUSCULAR | Status: DC | PRN
Start: 2017-02-22 — End: 2017-02-23

## 2017-02-22 MED ORDER — IOPAMIDOL (ISOVUE-370) INJECTION 76%
INTRAVENOUS | Status: DC | PRN
  Administered 2017-02-22: 130 mL via INTRA_ARTERIAL

## 2017-02-22 MED ORDER — CINACALCET HCL 30 MG PO TABS
120.0000 mg | ORAL_TABLET | ORAL | Status: DC
  Administered 2017-02-23: 120 mg via ORAL
  Filled 2017-02-22: qty 4

## 2017-02-22 MED ORDER — LIDOCAINE HCL (PF) 1 % IJ SOLN
INTRAMUSCULAR | Status: DC | PRN
  Administered 2017-02-22: 20 mL

## 2017-02-22 MED ORDER — SODIUM CHLORIDE 0.9% FLUSH: 3.0000 mL | INTRAVENOUS | Status: DC | PRN

## 2017-02-22 MED ORDER — LABETALOL HCL 5 MG/ML IV SOLN
10.0000 mg | INTRAVENOUS | Status: AC | PRN
  Administered 2017-02-22: 19:00:00 10 mg via INTRAVENOUS

## 2017-02-22 MED ORDER — HEPARIN (PORCINE) IN NACL 2-0.9 UNIT/ML-% IJ SOLN
INTRAMUSCULAR | Status: AC | PRN
  Administered 2017-02-22: 1500 mL via INTRA_ARTERIAL

## 2017-02-22 MED ORDER — CLOPIDOGREL BISULFATE 300 MG PO TABS
ORAL_TABLET | ORAL | Status: AC
Start: 2017-02-22 — End: ?
  Filled 2017-02-22: qty 2

## 2017-02-22 MED ORDER — MIDAZOLAM HCL 2 MG/2ML IJ SOLN
INTRAMUSCULAR | Status: AC
  Filled 2017-02-22: qty 2

## 2017-02-22 MED FILL — Medication: Qty: 1 | Status: AC

## 2017-02-22 SURGICAL SUPPLY — 27 items
BALLN SAPPHIRE 3.0X12 (BALLOONS) ×2
BALLN ~~LOC~~ EMERGE MR 3.75X15 (BALLOONS) ×2
BALLOON SAPPHIRE 3.0X12 (BALLOONS) IMPLANT
BALLOON ~~LOC~~ EMERGE MR 3.75X15 (BALLOONS) IMPLANT
CATH INFINITI 5FR JL5 (CATHETERS) ×1 IMPLANT
CATH INFINITI 5FR MULTPACK ANG (CATHETERS) ×1 IMPLANT
CATH SWAN GANZ 7F STRAIGHT (CATHETERS) ×1 IMPLANT
CATH VISTA GUIDE 6FR JR4 (CATHETERS) ×1 IMPLANT
COVER PRB 48X5XTLSCP FOLD TPE (BAG) IMPLANT
COVER PROBE 5X48 (BAG) ×2
DEVICE CLOSURE MYNXGRIP 6/7F (Vascular Products) ×1 IMPLANT
DEVICE WIRE ANGIOSEAL 6FR (Vascular Products) ×1 IMPLANT
ELECT DEFIB PAD ADLT CADENCE (PAD) ×1 IMPLANT
KIT ENCORE 26 ADVANTAGE (KITS) ×1 IMPLANT
KIT HEART LEFT (KITS) ×2 IMPLANT
KIT MICROINTRODUCER STIFF 5F (SHEATH) ×1 IMPLANT
PACK CARDIAC CATHETERIZATION (CUSTOM PROCEDURE TRAY) ×2 IMPLANT
SHEATH PINNACLE 5F 10CM (SHEATH) ×1 IMPLANT
SHEATH PINNACLE 6F 10CM (SHEATH) ×1 IMPLANT
SHEATH PINNACLE 7F 10CM (SHEATH) ×1 IMPLANT
STENT XIENCE ALPINE RX 3.5X23 (Permanent Stent) ×1 IMPLANT
TRANSDUCER W/STOPCOCK (MISCELLANEOUS) ×2 IMPLANT
TUBING CIL FLEX 10 FLL-RA (TUBING) ×2 IMPLANT
VALVE GUARDIAN II ~~LOC~~ HEMO (MISCELLANEOUS) ×1 IMPLANT
WIRE ASAHI PROWATER 180CM (WIRE) ×1 IMPLANT
WIRE EMERALD 3MM-J .025X260CM (WIRE) ×1 IMPLANT
WIRE EMERALD 3MM-J .035X150CM (WIRE) ×1 IMPLANT

## 2017-02-22 NOTE — H&P (View-Only) (Signed)
CARDIOLOGY CONSULT NOTE  Patient ID: [name] MRN: [id number] DOB/AGE: 54-01-1961 54 y.o.  Admit date: 02/20/2017 Referring Physician  Si Raider, Ailene Rud, MD Primary Physician:  [name], MD Reason for Consultation  Afib, CHF  HPI: [name]  is a 56 y.o. male with long standing hypertensive cardiomyopathy, end-stage renal disease on hemodialysis, chronic hepatitis B and C, COPD, tobacco and marijuana abuse, admitted with palpitations and shortness of breath.  Patient lives with his nephew. For the last few days, he has had episodes of diarrhea. He had not been eating adequately.  Although he continued to eat alt to alleviate his nausea symptoms.  Yesterday he had worsening of his shortness of breath. He does not recollect how the events that occurred while he   However does endorse palpitations but denies any presyncopal or syncopal episodes.  He denies any chest pain, leg edema. In the ED, he was found to be in episodes atrial fibrillation wit episodes of nonsustained ventricular tachycardia. In the ED, his blood was noted to be as high as 196/100 mmHg. He received IV doses of metoprolol and diltiazem.   This morning I saw the patient in hemodialysis unit.  He is still in atrial fibr controlled ventricularr  Rate of 70-90 bpm.  While on dialysis, his BP is soft around 90/70 mmHg. The goal for UF is -1.6 L. He is already at his dry weight of 70 Kg.    Past Medical History:  Diagnosis Date  . Adenomatous colon polyp    tubular  . Anemia   . Anxiety   . Arthritis    left shoulder  . Atherosclerosis of aorta ([place])   . Cardiomegaly   . Chest pain    DATE UNKNOWN, C/O PERIODICALLY  . Chronic kidney disease    dialysis - M/W/F  . Cocaine abuse   . COPD exacerbation ([place]) 08/17/2016  . Dialysis patient ([place])    Monday-Wednesday-Friday  . GERD (gastroesophageal reflux disease)    DATE UNKNOWN  . Hemorrhoids   . Hepatitis B, chronic ([place])   . Hepatitis C   .  Hyperkalemia   . Hypertension   . Metabolic bone disease    Patient denies  . Nephrolithiasis   . Pneumonia   . Pulmonary edema   . Renal disorder   . Renal insufficiency   . Shortness of breath dyspnea    " for the last past year with this dialysis"  . Tubular adenoma of colon      Past Surgical History:  Procedure Laterality Date  . AV FISTULA PLACEMENT  2012   BELIEVED WAS PLACED IN JUNE  . COLONOSCOPY    . HEMORRHOID BANDING    . LIGATION OF ARTERIOVENOUS  FISTULA Left 0/07/270   Procedure: Plication of Left Arm Arteriovenous Fistula;  Surgeon: Elam Dutch, MD;  Location: [facility];  Service: Vascular;  Laterality: Left;  . POLYPECTOMY    . REVISON OF ARTERIOVENOUS FISTULA Left 5/36/6440   Procedure: PLICATION OF DISTAL ANEURYSMAL SEGEMENT OF LEFT UPPER ARM ARTERIOVENOUS FISTULA;  Surgeon: Elam Dutch, MD;  Location: [facility];  Service: Vascular;  Laterality: Left;  . REVISON OF ARTERIOVENOUS FISTULA Left 3/47/4259   Procedure: Plication of Left Upper Arm Fistula ;  Surgeon: Waynetta Sandy, MD;  Location: [facility] OR;  Service: Vascular;  Laterality: Left;     Family History  Problem Relation Age of Onset  . Heart disease Mother   . Lung cancer Mother   .  Heart disease Father   . Malignant hyperthermia Father   . Throat cancer Sister   . Esophageal cancer Sister   . Hypertension Other   . COPD Other   . Colon cancer Neg Hx   . Colon polyps Neg Hx   . Rectal cancer Neg Hx   . Stomach cancer Neg Hx      Social History: Social History   Social History  . Marital status: Single    Spouse name: N/A  . Number of children: 3  . Years of education: 10   Occupational History  . Unemployed    Social History Main Topics  . Smoking status: Current Every Day Smoker    Packs/day: 1.00    Years: 43.00    Types: Cigarettes  . Smokeless tobacco: Never Used     Comment: 2-3 cigarettes per day  . Alcohol use 2.4 oz/week    1 Glasses of wine, 1 Cans of beer, 1  Shots of liquor, 1 Standard drinks or equivalent per week     Comment: occasional   . Drug use: Yes    Types: Marijuana, Cocaine  . Sexual activity: Not on file     Comment: "NOT LATELY" on cocaine   Other Topics Concern  . Not on file   Social History Narrative   Lives alone   Caffeine use: Coffee-rare   Soda- daily        Prescriptions Prior to Admission  Medication Sig Dispense Refill Last Dose  . albuterol (PROVENTIL HFA;VENTOLIN HFA) 108 (90 Base) MCG/ACT inhaler Inhale 2 puffs into the lungs every 6 (six) hours as needed for wheezing or shortness of breath.    01/14/2017 at am  . budesonide-formoterol (SYMBICORT) 160-4.5 MCG/ACT inhaler Inhale 2 puffs into the lungs 2 (two) times daily.   01/14/2017 at am  . CHANTIX 0.5 MG tablet Take 0.5 mg by mouth 2 (two) times daily. For thirty days  0   . gabapentin (NEURONTIN) 100 MG capsule Take 100 mg by mouth up to two times a day  0 01/13/2017 at pm  . minoxidil (LONITEN) 10 MG tablet Take 10 mg by mouth daily.   01/14/2017 at am  . oxyCODONE (OXY IR/ROXICODONE) 5 MG immediate release tablet Take 5 mg by mouth 2 (two) times daily as needed for pain.  0   . polyethylene glycol powder (GLYCOLAX/MIRALAX) powder Take 17grams (1 capful) dissolved in at least 8 ounces of water or juice, once daily 527 g 1 01/14/2017 at am  . sevelamer carbonate (RENVELA) 800 MG tablet Take 2,400 mg by mouth 3 (three) times daily with meals.   01/14/2017 at Unknown time  . acetaminophen (TYLENOL) 500 MG tablet Take 500-1,000 mg by mouth every 6 (six) hours as needed (for headaches and pain).   01/14/2017 at 1200  . oxyCODONE-acetaminophen (PERCOCET) 5-325 MG tablet Take 1 tablet by mouth every 6 (six) hours as needed. (Patient not taking: Reported on 02/20/2017) 10 tablet 0 Completed Course at Unknown time     Review of Systems - Negative except the following General ROS: Fatigue, malaise Respiratory ROS: dry coiugh, shortness of breath at rest on  admission Cardiovascular ROS: No chest pain. Endorses palpitations, associated with lightheadedness. No syncope Gastrointestinal ROS: Loose watery stools 2-3/day in last one week Neurological ROS: No syncope. States he does not recollect events in the ED  HEENT: Negative Skin: No pallor, cyanosis   Rest of the 10 point review negative.  Physical Exam: Blood pressure 118/70, pulse  86, temperature 98.4 F (36.9 C), temperature source Oral, resp. rate (!) 21, height 5\' 9"  (1.753 m), weight 70 kg (154 lb 5.2 oz), SpO2 94 %.   General appearance: cooperative, fatigued and no distress Lungs: Bibasilar rales  JVP: Elevated to jaw Chest wall: no tenderness Heart: Irregularly irregular, normal S21, S2, II/VI early sstolic mumrmur at aortic area and apex Abdomen: Mildly distended. Nontender. BS+ Extremities: Warm. No significant peripheral edema. LUE AV graft Pulses: Intact b/l. DP/PT 1+ b/l Neurologic: Grossly normal  Labs:  Lab Results  Component Value Date   WBC 7.8 02/21/2017   HGB 11.2 (L) 02/21/2017   HCT 32.1 (L) 02/21/2017   MCV 83.2 02/21/2017   PLT 86 (L) 02/21/2017    Recent Labs Lab 02/21/17 0235  NA 133*  K 4.3  CL 88*  CO2 27  BUN 48*  CREATININE 11.70*  CALCIUM 8.3*  PROT 6.8  BILITOT 1.6*  ALKPHOS 142*  ALT 17  AST 32  GLUCOSE 106*    Lipid Panel     Component Value Date/Time   CHOL 103 08/17/2016 0134   TRIG 97 08/17/2016 0134   HDL 34 (L) 08/17/2016 0134   CHOLHDL 3.0 08/17/2016 0134   VLDL 19 08/17/2016 0134   LDLCALC 50 08/17/2016 0134    BNP (last 3 results)  Recent Labs  08/18/16 0941 08/29/16 2259 02/20/17 1706  BNP >4,500.0* >4,500.0* 3,193.8*    HEMOGLOBIN A1C Lab Results  Component Value Date   HGBA1C 4.9 08/17/2016   MPG 94 08/17/2016    Cardiac Panel (last 3 results)  Recent Labs  08/17/16 0131 08/17/16 0834 08/17/16 2125  TROPONINI 0.11* 0.22* 0.33*    Lab Results  Component Value Date   CKTOTAL 274 (H)  12/23/2010   CKMB 4.6 (H) 12/23/2010   TROPONINI 0.33 (HH) 08/17/2016     TSH No results for input(s): TSH in the last 8760 hours.  EKG: normal EKG, normal sinus rhythm, unchanged from previous tracings, Afib. LBBB  Reviewed telemetry.  Episodes of atrial fibrillation with RVR and nonsustained VT.  Echo: Pending   Radiology: Dg Chest Portable 1 View  Result Date: 02/20/2017 CLINICAL DATA:  Shortness of Breath EXAM: PORTABLE CHEST 1 VIEW COMPARISON:  02/19/2017 FINDINGS: Cardiac shadow remains enlarged. Aortic calcifications are again. Increasing vascular congestion is noted without significant interstitial edema. No focal infiltrate is noted. IMPRESSION: Vascular congestion without significant edema. Electronically Signed   By: Inez Catalina M.D.   On: 02/20/2017 09:03   Dg Chest Port 1 View  Result Date: 02/19/2017 CLINICAL DATA:  Short of breath.  Dialysis patient EXAM: PORTABLE CHEST 1 VIEW COMPARISON:  01/14/2017 FINDINGS: Cardiac enlargement with mild vascular congestion. Negative for edema or effusion. Mild left lower lobe atelectasis unchanged. Atherosclerotic aortic arch IMPRESSION: Cardiac enlargement with mild vascular congestion. Negative for edema Mild left lower lobe atelectasis Electronically Signed   By: Franchot Gallo M.D.   On: 02/19/2017 16:02    Scheduled Meds: . gabapentin  100 mg Oral TID  . minoxidil  10 mg Oral Daily  . sevelamer carbonate  2,400 mg Oral TID WC   Continuous Infusions: PRN Meds:.acetaminophen **OR** acetaminophen, HYDROcodone-acetaminophen, levalbuterol, ondansetron **OR** ondansetron (ZOFRAN) IV, senna-docusate  CARDIAC STUDIES:  As above  ASSESSMENT AND PLAN:   1. Afib w/RVR and nonsustained VT:     Underlying LBBB. Known hypertensive cardiomyopathy.      It is likely precipitated in the setting of HFpEF exacerbation     Recommend starting beta  blocker metoprolol xL 25 mg.     Can use IV metoprolol 2.5 mg for episodes of RVR, as  tolerated by blood pressure.     CHA2DS2-VASc score 2. Consider anticoagulation. Given lack of safety data in ESRD patients with NOAC, consider warfarin.   2. HFpEF:     Acute on chronic. Although his clinical exam does not reveal significant edema, and he is close to his dry weight, he does have elevated JVP. Hyponatremia seen, which a poor prognostic factor. Recent dietary noncompliance with salt and hypertensive urgency likely culprit. Agree with echocardiogram. We may consider RHC/LHC to establish etiology of his heart failure and aid with hemodynamic assessment and medication changes.  Appreciate Nephrology help. Needs diet and medication compliance.  3. ESRD on HD  4. Hyponatremia  5. Hep B,C  6. Tobacco and marijuana abuse  7. Thrombocytopenia  Thank you for the consult. We will continue to follow the patient with you Please feel free to contact with any questions  Nigel Mormon, MD 02/21/2017, 7:07 AM [facility]. [place] Office: [phone] If no answer Cell [phone]

## 2017-02-22 NOTE — Progress Notes (Signed)
[facility]ed [facility] For his plan fistulogram,as for them ,unsure if they are able to do it today,becouse of their overwhelmed procedure load today.Explained to patient's daughter,they are upset when they know that its not yet sure if [facility] Would be able to do it today.They pushing it hard to do it today.Paged Primary,made aware.

## 2017-02-22 NOTE — Progress Notes (Signed)
Subjective:  Sleepy this morning Episdoes of tachcyardia in 140s, all Afib with underlying LBBB -0.7 out on dialysis. Limited due to hypotension  Nurse Report: Episodes of anxiety. States patient is on CIWA protocol and has prn ativan ordered Brief episodes of tachyardia overnight  Objective:  Vital Signs in the last 24 hours: Temp:  [97.1 F (36.2 C)-98.5 F (36.9 C)] 98.5 F (36.9 C) (08/14 0505) Pulse Rate:  [54-90] 90 (08/14 0505) Resp:  [16-23] 17 (08/14 0505) BP: (75-145)/(42-100) 110/70 (08/14 0505) SpO2:  [93 %-99 %] 98 % (08/14 0505) Weight:  [69.9 kg (154 lb 1.6 oz)] 69.9 kg (154 lb 1.6 oz) (08/13 2037)  Intake/Output from previous day: 08/13 0701 - 08/14 0700 In: 360 [P.O.:360] Out: 793   Physical Exam:  General appearance: cooperative, somnolent  Lungs: Bibasilar rales improved today JVP: Elevated to jaw Chest wall: no tenderness Heart: Irregularly irregular, normal S21, S2, II/VI early sstolic mumrmur at aortic area and apex, II/VI early diastolic murmur Abdomen: Mildly distended. Nontender. BS+ Extremities: Warm. No significant peripheral edema. LUE AV graft Pulses: Intact b/l. DP/PT 1+ b/l Neurologic: Grossly normal    Lab Results: BMP  Recent Labs  02/19/17 1550 02/20/17 0844 02/20/17 0845 02/21/17 0235  NA 131* 131* 134* 133*  K 5.1 5.7* 6.0* 4.3  CL 88* 90* 87* 88*  CO2 26  --  23 27  GLUCOSE 87 37* 40* 106*  BUN 29* 44* 37* 48*  CREATININE 8.62* 10.10* 10.23* 11.70*  CALCIUM 8.1*  --  8.9 8.3*  GFRNONAA 6*  --  5* 4*  GFRAA 7*  --  6* 5*    CBC  Recent Labs Lab 02/20/17 0845 02/21/17 0235  WBC 12.0* 7.8  RBC 4.24 3.86*  HGB 12.6* 11.2*  HCT 35.9* 32.1*  PLT 74* 86*  MCV 84.7 83.2  MCH 29.7 29.0  MCHC 35.1 34.9  RDW 17.2* 17.1*  LYMPHSABS 3.6  --   MONOABS 0.7  --   EOSABS 0.1  --   BASOSABS 0.0  --     HEMOGLOBIN A1C Lab Results  Component Value Date   HGBA1C 4.9 08/17/2016   MPG 94 08/17/2016    Cardiac Panel  (last 3 results)  Recent Labs  08/17/16 0131 08/17/16 0834 08/17/16 2125  TROPONINI 0.11* 0.22* 0.33*    BNP (last 3 results) No results for input(s): PROBNP in the last 8760 hours.  TSH No results for input(s): TSH in the last 8760 hours.  Lipid Panel     Component Value Date/Time   CHOL 103 08/17/2016 0134   TRIG 97 08/17/2016 0134   HDL 34 (L) 08/17/2016 0134   CHOLHDL 3.0 08/17/2016 0134   VLDL 19 08/17/2016 0134   LDLCALC 50 08/17/2016 0134     Hepatic Function Panel  Recent Labs  01/14/17 1930 02/15/17 1425 02/20/17 0845 02/21/17 0235  PROT 8.0 7.3 8.1 6.8  ALBUMIN 4.1 4.4 4.2 3.8  AST 23 16 44* 32  ALT 13* 8* 13* 17  ALKPHOS 165* 166* 160* 142*  BILITOT 0.6 0.9 2.6* 1.6*  BILIDIR 0.2  --   --   --   IBILI 0.4  --   --   --     Imaging: Echocardiogram 02/22/2017 - Severe concentric LV hypertrophy. Mildly reduced EF 40-45%   Moderate to severe biatrial enlargement   Moderate RV enlargement. Mild RV dysfunction   Severe calcific mixed valvular disease   Moderate MR. Moderate TR   Trivial pericardial effusion. Cardiac  Studies:  EKG: No new EKG today Afib w/RVR, underlying LBBB EKG 02/21/2017  ECHO:  Assessment/Plan:  1. Afib w/RVR and nonsustained VT:     Underlying LBBB. Known hypertensive cardiomyopathy.      Mixed calcific valvular disease causing biatrial dilatation and hypertensive cardiomyopathy are likely culprits     Recommend starting beta blocker metoprolol xL 25 mg.     Can use IV metoprolol 2.5 mg for episodes of RVR, as tolerated by blood pressure.     CHA2DS2-VASc score 2. Consider anticoagulation. Given lack of safety data in ESRD patients with NOAC, consider warfarin.   2- Episodes of hypotension on dialysis:     Risk factors for CAD. Need to evaluate for CAD, especially in light of unsustained VT     Plan for [facility] and coronary angiogram today. Patient may have breakfast/coffee no later than 9:30. Will plan for the procedure  later today.  2. Heart failure with mildly reduced ejection fraction:     Acute on chronic. Currently stable  3. ESRD on HD  4. Hyponatremia  5. Hep B,C  6. Tobacco and marijuana abuse  7. Thrombocytopenia   Nigel Mormon, M.D. 02/22/2017, 9:14 AM [facility] Office: [phone] If no answer: [phone]

## 2017-02-22 NOTE — Interval H&P Note (Signed)
History and Physical Interval Note:  02/22/2017 3:40 PM  [name]  has presented today for surgery, with the diagnosis of Acute on chronic diastolic heart failure and pulmonary edema, nonsustained ventricular tachycardia  The various methods of treatment have been discussed with the patient and family. After consideration of risks, benefits and other options for treatment, the patient has consented to  Procedure(s): LEFT HEART CATH AND CORONARY ANGIOGRAPHY (N/A) as a surgical intervention .  The patient's history has been reviewed, patient examined, no change in status, stable for surgery.  I have reviewed the patient's chart and labs.  Questions were answered to the patient's satisfaction.     Cath Lab Visit (complete for each Cath Lab visit)  Clinical Evaluation Leading to the Procedure:   ACS: Yes.    Non-ACS:    Anginal Classification: CCS IV (Angina equivalent with shortness of breath. Acute on chronic diastolic heart failure)  Anti-ischemic medical therapy: No Therapy  Non-Invasive Test Results: No non-invasive testing performed  Prior CABG: No previous CABG        Manish J Patwardhan

## 2017-02-22 NOTE — Progress Notes (Signed)
PROGRESS NOTE    [name]  ZOX:[id number] DOB: Feb 20, 1961 DOA: 02/20/2017 PCP: [name], MD   Brief Narrative: 56 yo male with history of esrd,hep c,hep b not on any meds admitted with chest pain and sob.denies nausea vomiting.had diarhhea for 3 days from taking laxatives.he was tachycardic on admit.he received lopressor and cardizem.treated with bipap for volume overload.he continues to smoke and drink alcohol.patient to have heart cath today..   Assessment & Plan:   Active Problems:   Alcohol abuse   Cigarette smoker   Chronic hepatitis B (HCC)   Chronic hepatitis C without hepatic coma (HCC)   Thrombocytopenia ([facility])   End stage renal disease (H[facility])   Fluid overload   COPD GOLD II/ still smoking   Essential hypertension   Acute respiratory failure secondary to copd and chf.now off bipap.had dialysis today.nephrology following.  ESRD continue HD MWF.  HEP B/HEPC bilirubin 2.6.  Chronic thrombocytopenia secondary to esrd,hepc,etoh,multifactorial.  Hypertension on minoxidil.  Tachycardia resolved.rapid afib.treated with iv cardizem in er,for cath today.  Leukocytosis resolved.  Tobacco abuse start nicotine 21 mcg.  Alcohol abuse monitor for withdrawal.   DVT prophylaxis:  Code Status: full Family Communication: Disposition Plan:    Consultants: cardiology and nephrology  Procedures: cath  Antimicrobials:    Subjective: No complaints  Objective: Vitals:   02/22/17 1741 02/22/17 1746 02/22/17 1750 02/22/17 1915  BP: (!) 156/93 (!) 165/96 (!) 166/114 (!) 158/97  Pulse: 75 87 87 (!) 106  Resp: 12 16 15  (!) 25  Temp:    98 F (36.7 C)  TempSrc:    Oral  SpO2: 97% 100% 99% 95%  Weight:      Height:        Intake/Output Summary (Last 24 hours) at 02/22/17 2004 Last data filed at 02/22/17 1939  Gross per 24 hour  Intake              480 ml  Output                0 ml  Net              480 ml   Filed Weights   02/21/17  0645 02/21/17 1105 02/21/17 2037  Weight: 70.3 kg (154 lb 15.7 oz) 69.9 kg (154 lb 1.6 oz) 69.9 kg (154 lb 1.6 oz)    Examination:  General exam: Appears calm and comfortable  Respiratory system: Clear to auscultation. Respiratory effort normal. Cardiovascular system: S1 & S2 heard, RRR. No JVD, murmurs, rubs, gallops or clicks. No pedal edema. Gastrointestinal system: Abdomen is nondistended, soft and nontender. No organomegaly or masses felt. Normal bowel sounds heard. Central nervous system: Alert and oriented. No focal neurological deficits. Extremities: Symmetric 5 x 5 power. Skin: No rashes, lesions or ulcers Psychiatry: Judgement and insight appear normal. Mood & affect appropriate.     Data Reviewed: I have personally reviewed following labs and imaging studies  CBC:  Recent Labs Lab 02/19/17 1550 02/20/17 0844 02/20/17 0845 02/21/17 0235  WBC 8.1  --  12.0* 7.8  NEUTROABS  --   --  7.3  --   HGB 11.8* 13.9 12.6* 11.2*  HCT 34.1* 41.0 35.9* 32.1*  MCV 84.8  --  84.7 83.2  PLT 59*  --  74* 86*   Basic Metabolic Panel:  Recent Labs Lab 02/19/17 1550 02/20/17 0844 02/20/17 0845 02/20/17 1019 02/21/17 0235  NA 131* 131* 134*  --  133*  K 5.1 5.7* 6.0*  --  4.3  CL 88* 90* 87*  --  88*  CO2 26  --  23  --  27  GLUCOSE 87 37* 40*  --  106*  BUN 29* 44* 37*  --  48*  CREATININE 8.62* 10.10* 10.23*  --  11.70*  CALCIUM 8.1*  --  8.9  --  8.3*  MG  --   --   --  2.8*  --   PHOS  --   --   --   --  5.1*   GFR: Estimated Creatinine Clearance: 7.1 mL/min (A) (by C-G formula based on SCr of 11.7 mg/dL (H)). Liver Function Tests:  Recent Labs Lab 02/20/17 0845 02/21/17 0235  AST 44* 32  ALT 13* 17  ALKPHOS 160* 142*  BILITOT 2.6* 1.6*  PROT 8.1 6.8  ALBUMIN 4.2 3.8   No results for input(s): LIPASE, AMYLASE in the last 168 hours. No results for input(s): AMMONIA in the last 168 hours. Coagulation Profile:  Recent Labs Lab 02/21/17 0235  INR 1.64     Cardiac Enzymes: No results for input(s): CKTOTAL, CKMB, CKMBINDEX, TROPONINI in the last 168 hours. BNP (last 3 results) No results for input(s): PROBNP in the last 8760 hours. HbA1C: No results for input(s): HGBA1C in the last 72 hours. CBG:  Recent Labs Lab 02/21/17 1958 02/22/17 0008 02/22/17 0415 02/22/17 0747 02/22/17 1223  GLUCAP 127* 123* 127* 105* 99   Lipid Profile: No results for input(s): CHOL, HDL, LDLCALC, TRIG, CHOLHDL, LDLDIRECT in the last 72 hours. Thyroid Function Tests: No results for input(s): TSH, T4TOTAL, FREET4, T3FREE, THYROIDAB in the last 72 hours. Anemia Panel: No results for input(s): VITAMINB12, FOLATE, FERRITIN, TIBC, IRON, RETICCTPCT in the last 72 hours. Sepsis Labs: No results for input(s): PROCALCITON, LATICACIDVEN in the last 168 hours.  Recent Results (from the past 240 hour(s))  Culture, blood (Routine X 2) w Reflex to ID Panel     Status: None (Preliminary result)   Collection Time: 02/20/17  5:10 PM  Result Value Ref Range Status   Specimen Description BLOOD RIGHT ANTECUBITAL  Final   Special Requests   Final    BOTTLES DRAWN AEROBIC AND ANAEROBIC Blood Culture adequate volume   Culture NO GROWTH 2 DAYS  Final   Report Status PENDING  Incomplete  Culture, blood (Routine X 2) w Reflex to ID Panel     Status: None (Preliminary result)   Collection Time: 02/20/17  5:15 PM  Result Value Ref Range Status   Specimen Description BLOOD RIGHT FOREARM  Final   Special Requests   Final    BOTTLES DRAWN AEROBIC AND ANAEROBIC Blood Culture adequate volume   Culture NO GROWTH 2 DAYS  Final   Report Status PENDING  Incomplete  MRSA PCR Screening     Status: None   Collection Time: 02/20/17  9:19 PM  Result Value Ref Range Status   MRSA by PCR NEGATIVE NEGATIVE Final    Comment:        The GeneXpert MRSA Assay (FDA approved for NASAL specimens only), is one component of a comprehensive MRSA colonization surveillance program. It is  not intended to diagnose MRSA infection nor to guide or monitor treatment for MRSA infections.          Radiology Studies: X-ray Chest Pa And Lateral  Result Date: 02/21/2017 CLINICAL DATA:  Shortness of Breath EXAM: CHEST  2 VIEW COMPARISON:  February 20, 2017 FINDINGS: There is slight interstitial edema. There is a minimal left pleural  effusion. No consolidation. There is cardiomegaly with pulmonary venous hypertension. There is aortic atherosclerosis. There is no evident bone lesion. IMPRESSION: Evidence of a degree of congestive heart failure with cardiomegaly, pulmonary venous hypertension, and mild interstitial edema. There is a small pleural effusion. No consolidation. There is aortic atherosclerosis. Aortic Atherosclerosis (ICD10-I70.0). Electronically Signed   By: Lowella Grip III M.D.   On: 02/21/2017 14:07        Scheduled Meds: . [START ON 02/23/2017] cinacalcet  120 mg Oral Q M,W,F-HD  . [START ON 02/23/2017] clopidogrel  75 mg Oral Daily  . coumadin book   Does not apply Once  . [START ON 02/23/2017] doxercalciferol  0.5 mcg Oral Q M,W,F-HD  . gabapentin  100 mg Oral TID  . labetalol      . LORazepam  0-4 mg Intravenous Q6H   Followed by  . [START ON 02/23/2017] LORazepam  0-4 mg Intravenous Q12H  . metoprolol succinate  25 mg Oral Daily  . multivitamin  1 tablet Oral QHS  . sevelamer carbonate  2,400 mg Oral TID WC  . sodium chloride flush  3 mL Intravenous Q12H  . warfarin  5 mg Oral q1800  . warfarin   Does not apply Once  . [START ON 02/23/2017] Warfarin - Pharmacist Dosing Inpatient   Does not apply q1800   Continuous Infusions: . sodium chloride       LOS: 2 days    Time spent:     Georgette Shell, MD [facility] Pager [phone]  If 7PM-7AM, please contact night-coverage [email or web address] Password [facility] 02/22/2017, 8:04 PM

## 2017-02-22 NOTE — Progress Notes (Signed)
[facility] Progress Note   Subjective:  Sleeping, asking for coffee. Denies chest pain/SOB  Objective Vitals:   02/21/17 1834 02/21/17 2037 02/22/17 0505 02/22/17 0900  BP: (!) 145/68 115/67 110/70 114/71  Pulse: 73 89 90 87  Resp: 20 19 17 18   Temp: 98.2 F (36.8 C) 98.3 F (36.8 C) 98.5 F (36.9 C) 98.4 F (36.9 C)  TempSrc: Oral Oral Oral Oral  SpO2: 98% 97% 98% 99%  Weight:  69.9 kg (154 lb 1.6 oz)    Height:       Physical Exam General: chronically ill appearing male in NAD Heart: irregularly irregular. H4,L9, 2/6 systolic M. AFib with LBBB rate 80s Lungs: CTAB Abdomen: Active BS Extremities: No LE edema Dialysis Access: LUA AVF + bruit. Aneurysmal. Needs to be seen by [facility] for possible plication as OP.    Additional Objective Labs: Basic Metabolic Panel:  Recent Labs Lab 02/19/17 1550 02/20/17 0844 02/20/17 0845 02/21/17 0235  NA 131* 131* 134* 133*  K 5.1 5.7* 6.0* 4.3  CL 88* 90* 87* 88*  CO2 26  --  23 27  GLUCOSE 87 37* 40* 106*  BUN 29* 44* 37* 48*  CREATININE 8.62* 10.10* 10.23* 11.70*  CALCIUM 8.1*  --  8.9 8.3*  PHOS  --   --   --  5.1*   Liver Function Tests:  Recent Labs Lab 02/15/17 1425 02/20/17 0845 02/21/17 0235  AST 16 44* 32  ALT 8* 13* 17  ALKPHOS 166* 160* 142*  BILITOT 0.9 2.6* 1.6*  PROT 7.3 8.1 6.8  ALBUMIN 4.4 4.2 3.8   No results for input(s): LIPASE, AMYLASE in the last 168 hours. CBC:  Recent Labs Lab 02/19/17 1550 02/20/17 0844 02/20/17 0845 02/21/17 0235  WBC 8.1  --  12.0* 7.8  NEUTROABS  --   --  7.3  --   HGB 11.8* 13.9 12.6* 11.2*  HCT 34.1* 41.0 35.9* 32.1*  MCV 84.8  --  84.7 83.2  PLT 59*  --  74* 86*   Blood Culture    Component Value Date/Time   SDES BLOOD RIGHT FOREARM 02/20/2017 1715   SPECREQUEST  02/20/2017 1715    BOTTLES DRAWN AEROBIC AND ANAEROBIC Blood Culture adequate volume   CULT NO GROWTH 1 DAY 02/20/2017 1715   REPTSTATUS PENDING 02/20/2017 1715    Cardiac  Enzymes: No results for input(s): CKTOTAL, CKMB, CKMBINDEX, TROPONINI in the last 168 hours. CBG:  Recent Labs Lab 02/20/17 1716 02/21/17 1958 02/22/17 0008 02/22/17 0415 02/22/17 0747  GLUCAP 120* 127* 123* 127* 105*   Iron Studies: No results for input(s): IRON, TIBC, TRANSFERRIN, FERRITIN in the last 72 hours. @lablastinr3 @ Studies/Results: X-ray Chest Pa And Lateral  Result Date: 02/21/2017 CLINICAL DATA:  Shortness of Breath EXAM: CHEST  2 VIEW COMPARISON:  February 20, 2017 FINDINGS: There is slight interstitial edema. There is a minimal left pleural effusion. No consolidation. There is cardiomegaly with pulmonary venous hypertension. There is aortic atherosclerosis. There is no evident bone lesion. IMPRESSION: Evidence of a degree of congestive heart failure with cardiomegaly, pulmonary venous hypertension, and mild interstitial edema. There is a small pleural effusion. No consolidation. There is aortic atherosclerosis. Aortic Atherosclerosis (ICD10-I70.0). Electronically Signed   By: Lowella Grip III M.D.   On: 02/21/2017 14:07   Medications: . sodium chloride    . [START ON 02/23/2017] sodium chloride     . [START ON 02/23/2017] aspirin  81 mg Oral Pre-Cath  . gabapentin  100 mg  Oral TID  . LORazepam  0-4 mg Intravenous Q6H   Followed by  . [START ON 02/23/2017] LORazepam  0-4 mg Intravenous Q12H  . multivitamin  1 tablet Oral QHS  . sevelamer carbonate  2,400 mg Oral TID WC  . sodium chloride flush  3 mL Intravenous Q12H     Dialysis: [facility] 4h  2.2 bath  70kg  450/800   Hep 3000  LUA AVF -venofer 50/wk -calcitriol 0.5 ug tiw -mircera 75 every 2 wks, last 8/1 -sensipar 120 mg tiw at hd   Impression/plan: 1 Afib RVR with LBBB: Cardiology following. For cardiac cath today. Patient is non-compliant with HD, would not consider safe patient for anticoagulation.  Metoprolol recommended. HR ^ 140s during night last PM. Currently HR 80s. AFib with LBBB.  2 Severe  concentric LV hypertrophy. Mildly reduced EF 40-45%. Per cardiology.  3. ESRD on HD MWF. Next HD tomorrow. K+ 4.3 2.0 K [place]. Usual heparin dose.  4  Chronic hep B-  Surface Ag + 5  Hx hepatitis C - finished course of Rx earlier this year and had good response 6 Hypertension/Volume: Had issues with hypotension on HD yesterday. Pre wt 70.3 Net UF 793 Post wt 69.9. BP lower than usual, but stable. Attempt UFG 0.5 tomorrow. Appears euvolemic by exam.  7. H/O polysubstance abuse: On CIWA protocol. Question if withdrawal symptoms exacerbating AF? [facility]???  Currently pt denies recent drug use?? 8  COPD - no wheezing. Continues to smoke.  9  Hyperkalemia - Resolved with kayexalate and HD. Last K+ 4.3  10. CKD BMD: cont binders/VDRA. Resume sensipar.  Ca 8.3 C Ca 8.4. Phos 5.1  11. Nutrition: Albumin 3.8 NPO for cardiac cath.  12. Anemia: HGB 11.2 No ESA needed.    Rita H. Brown NP-C 02/22/2017, 9:40 AM  [employer] [phone] I have seen and examined this patient and agree with plan per Juanell Fairly.  Note plans for Ht cath.  Spoke with Dr Joelyn Oms who sees pt at [facility] and he says anticoagulation not good idea due to compliance issues and the fact that pt never seems to know what meds he is taking??   HD tomorrow. Hamed Debella T,MD 02/22/2017 10:33 AM

## 2017-02-22 NOTE — Progress Notes (Signed)
ANTICOAGULATION CONSULT NOTE - Initial Consult  Pharmacy Consult for Warfarin Indication: atrial fibrillation  Allergies  Allergen Reactions  . Aspirin Other (See Comments)    STOMACH PAIN  . Clonidine Derivatives Itching  . Tramadol Itching    Patient Measurements: Height: 5\' 9"  (175.3 cm) Weight: 154 lb 1.6 oz (69.9 kg) IBW/kg (Calculated) : 70.7  Vital Signs: Temp: 98.4 F (36.9 C) (08/14 0900) Temp Source: Oral (08/14 0900) BP: 166/114 (08/14 1750) Pulse Rate: 87 (08/14 1750)  Labs:  Recent Labs  02/20/17 0844 02/20/17 0845 02/21/17 0235  HGB 13.9 12.6* 11.2*  HCT 41.0 35.9* 32.1*  PLT  --  74* 86*  LABPROT  --   --  19.6*  INR  --   --  1.64  CREATININE 10.10* 10.23* 11.70*    Estimated Creatinine Clearance: 7.1 mL/min (A) (by C-G formula based on SCr of 11.7 mg/dL (H)).   Medical History: Past Medical History:  Diagnosis Date  . Adenomatous colon polyp    tubular  . Anemia   . Anxiety   . Arthritis    left shoulder  . Atherosclerosis of aorta ([place])   . Cardiomegaly   . Chest pain    DATE UNKNOWN, C/O PERIODICALLY  . Chronic kidney disease    dialysis - M/W/F  . Cocaine abuse   . COPD exacerbation ([place]) 08/17/2016  . Dialysis patient ([place])    Monday-Wednesday-Friday  . GERD (gastroesophageal reflux disease)    DATE UNKNOWN  . Hemorrhoids   . Hepatitis B, chronic ([place])   . Hepatitis C   . Hyperkalemia   . Hypertension   . Metabolic bone disease    Patient denies  . Nephrolithiasis   . Pneumonia   . Pulmonary edema   . Renal disorder   . Renal insufficiency   . Shortness of breath dyspnea    " for the last past year with this dialysis"  . Tubular adenoma of colon     Assessment: 56 year old male with ESRD to begin warfarin for Afib  Goal of Therapy:  INR 2-3 Monitor platelets by anticoagulation protocol: Yes   Plan:  Warfarin 5 mg po daily, first dose tonight Daily INR Warfarin education  Thank you Anette Guarneri,  PharmD [phone]  Tad Moore 02/22/2017,7:22 PM

## 2017-02-22 NOTE — Progress Notes (Signed)
Primary called back post called with [facility],told this nurse that [facility] Is dealing with trauma at E.D. And they are aware of patient's situation.Went back to patient's room and explained to his daughter.

## 2017-02-23 ENCOUNTER — Encounter (HOSPITAL_COMMUNITY): Payer: Self-pay | Admitting: Cardiology

## 2017-02-23 DIAGNOSIS — R0602 Shortness of breath: Secondary | ICD-10-CM

## 2017-02-23 DIAGNOSIS — I4891 Unspecified atrial fibrillation: Secondary | ICD-10-CM

## 2017-02-23 LAB — BASIC METABOLIC PANEL
Anion gap: 13 (ref 5–15)
BUN: 25 mg/dL — ABNORMAL HIGH (ref 6–20)
CO2: 25 mmol/L (ref 22–32)
Calcium: 8.5 mg/dL — ABNORMAL LOW (ref 8.9–10.3)
Chloride: 91 mmol/L — ABNORMAL LOW (ref 101–111)
Creatinine, Ser: 8.81 mg/dL — ABNORMAL HIGH (ref 0.61–1.24)
GFR calc Af Amer: 7 mL/min — ABNORMAL LOW (ref >60)
GFR calc non Af Amer: 6 mL/min — ABNORMAL LOW (ref >60)
Glucose, Bld: 87 mg/dL (ref 65–99)
Potassium: 4.3 mmol/L (ref 3.5–5.1)
Sodium: 129 mmol/L — ABNORMAL LOW (ref 135–145)

## 2017-02-23 LAB — CBC
HCT: 28.5 % — ABNORMAL LOW (ref 39.0–52.0)
Hemoglobin: 9.7 g/dL — ABNORMAL LOW (ref 13.0–17.0)
MCH: 28.4 pg (ref 26.0–34.0)
MCHC: 34 g/dL (ref 30.0–36.0)
MCV: 83.6 fL (ref 78.0–100.0)
Platelets: 66 10*3/uL — ABNORMAL LOW (ref 150–400)
RBC: 3.41 MIL/uL — ABNORMAL LOW (ref 4.22–5.81)
RDW: 17.1 % — ABNORMAL HIGH (ref 11.5–15.5)
WBC: 5.3 10*3/uL (ref 4.0–10.5)

## 2017-02-23 LAB — PROTIME-INR
INR: 1.26
Prothrombin Time: 15.9 seconds — ABNORMAL HIGH (ref 11.4–15.2)

## 2017-02-23 MED ORDER — LORAZEPAM 2 MG/ML IJ SOLN
INTRAMUSCULAR | Status: AC
  Filled 2017-02-23: qty 1

## 2017-02-23 MED ORDER — PENTAFLUOROPROP-TETRAFLUOROETH EX AERO: 1.0000 "application " | INHALATION_SPRAY | CUTANEOUS | Status: DC | PRN

## 2017-02-23 MED ORDER — WARFARIN SODIUM 5 MG PO TABS: 5.0000 mg | ORAL_TABLET | Freq: Every day | ORAL | 0 refills | Status: DC

## 2017-02-23 MED ORDER — METOPROLOL SUCCINATE ER 25 MG PO TB24: 25.0000 mg | ORAL_TABLET | Freq: Every day | ORAL | 0 refills | Status: DC

## 2017-02-23 MED ORDER — CLOPIDOGREL BISULFATE 75 MG PO TABS: 75.0000 mg | ORAL_TABLET | Freq: Every day | ORAL | 0 refills | Status: DC

## 2017-02-23 MED ORDER — DOXERCALCIFEROL 0.5 MCG PO CAPS
ORAL_CAPSULE | ORAL | Status: AC
Start: 2017-02-23 — End: 2017-02-23
  Filled 2017-02-23: qty 1

## 2017-02-23 MED ORDER — RENA-VITE PO TABS: 1.0000 | ORAL_TABLET | Freq: Every day | ORAL | 0 refills | Status: DC

## 2017-02-23 MED ORDER — OXYCODONE-ACETAMINOPHEN 5-325 MG PO TABS
1.0000 | ORAL_TABLET | Freq: Four times a day (QID) | ORAL | Status: DC | PRN
  Administered 2017-02-23: 1 via ORAL
  Filled 2017-02-23: qty 1

## 2017-02-23 MED ORDER — SODIUM CHLORIDE 0.9 % IV SOLN: 100.0000 mL | INTRAVENOUS | Status: DC | PRN

## 2017-02-23 MED ORDER — HEPARIN SODIUM (PORCINE) 1000 UNIT/ML DIALYSIS: 1000.0000 [IU] | INTRAMUSCULAR | Status: DC | PRN

## 2017-02-23 MED ORDER — OXYCODONE HCL 5 MG PO TABS: 5.0000 mg | ORAL_TABLET | Freq: Four times a day (QID) | ORAL | Status: DC | PRN

## 2017-02-23 MED ORDER — LIDOCAINE HCL (PF) 1 % IJ SOLN: 5.0000 mL | INTRAMUSCULAR | Status: DC | PRN

## 2017-02-23 MED ORDER — LIDOCAINE-PRILOCAINE 2.5-2.5 % EX CREA: 1.0000 "application " | TOPICAL_CREAM | CUTANEOUS | Status: DC | PRN

## 2017-02-23 MED ORDER — ALTEPLASE 2 MG IJ SOLR
2.0000 mg | Freq: Once | INTRAMUSCULAR | Status: DC | PRN
Start: 2017-02-23 — End: 2017-02-23

## 2017-02-23 MED ORDER — HEPARIN SODIUM (PORCINE) 1000 UNIT/ML DIALYSIS: 3000.0000 [IU] | Freq: Once | INTRAMUSCULAR | Status: DC

## 2017-02-23 MED FILL — Lidocaine Inj 1% w/ Epinephrine-1:100000: INTRAMUSCULAR | Qty: 20 | Status: AC

## 2017-02-23 NOTE — Pre-Procedure Instructions (Signed)
Ok to discharge from cardiology standpoint. Continue plavix and warfarin. Continue metoprolol xL. Follow up on 8/21 at 3:30 PM with [facility].  Full note to follow  Vernell Leep, MD

## 2017-02-23 NOTE — Progress Notes (Signed)
Received pt at the beginning of the shift with femstop  @ R femoral area , no hematoma,still bleeding with saturated dressing around site.Removed femstop & blood saturated dressing. Manual pressure applied for 20 minutes then reapplied femstop pressure as per order. In the process of applying femstop pt has been non-complaint, verbally aggressive to staff members,  trying to get OOB multiple times. Ativan 1 mg IV given as per CIWA protocol order. Pt was able to sleep. Femstop removed at 23:00. Right femoral site remains Level"0". Will continue to monitor pt.

## 2017-02-23 NOTE — Progress Notes (Signed)
Subjective:  Patient seen during hemodialysis this morning Complains of pain at cath site in right site but no swelling Afib w/RVR episdoes  Nurse Report: Episodes of anxiety. No residual hematoma  Objective:  Vital Signs in the last 24 hours: Temp:  [97.7 F (36.5 C)-98 F (36.7 C)] 98 F (36.7 C) (08/15 0730) Pulse Rate:  [54-115] 60 (08/15 1000) Resp:  [11-74] 15 (08/15 0830) BP: (113-172)/(59-114) 130/80 (08/15 1000) SpO2:  [94 %-100 %] 100 % (08/15 0730) Weight:  [74 kg (163 lb 2.3 oz)-74.7 kg (164 lb 10.9 oz)] 74 kg (163 lb 2.3 oz) (08/15 0730)  Intake/Output from previous day: 08/14 0701 - 08/15 0700 In: 720 [P.O.:720] Out: 0   Physical Exam:  General appearance: cooperative, somnolent  Lungs: Bibasilar rales improved today JVP: Elevated to jaw Chest wall: no tenderness Heart: Irregularly irregular, normal S21, S2, II/VI early sstolic mumrmur at aortic area and apex, II/VI early diastolic murmur Abdomen: Mildly distended. Nontender. BS+ Extremities: Cath site stable in right groin. No hematoma Pulses: Intact b/l. DP/PT 1+ b/l Neurologic: Grossly normal    Lab Results: BMP  Recent Labs  02/20/17 0845 02/21/17 0235 02/23/17 0547  NA 134* 133* 129*  K 6.0* 4.3 4.3  CL 87* 88* 91*  CO2 23 27 25   GLUCOSE 40* 106* 87  BUN 37* 48* 25*  CREATININE 10.23* 11.70* 8.81*  CALCIUM 8.9 8.3* 8.5*  GFRNONAA 5* 4* 6*  GFRAA 6* 5* 7*    CBC  Recent Labs Lab 02/20/17 0845  02/23/17 0547  WBC 12.0*  < > 5.3  RBC 4.24  < > 3.41*  HGB 12.6*  < > 9.7*  HCT 35.9*  < > 28.5*  PLT 74*  < > 66*  MCV 84.7  < > 83.6  MCH 29.7  < > 28.4  MCHC 35.1  < > 34.0  RDW 17.2*  < > 17.1*  LYMPHSABS 3.6  --   --   MONOABS 0.7  --   --   EOSABS 0.1  --   --   BASOSABS 0.0  --   --   < > = values in this interval not displayed.  HEMOGLOBIN A1C Lab Results  Component Value Date   HGBA1C 4.9 08/17/2016   MPG 94 08/17/2016    Cardiac Panel (last 3 results)  Recent  Labs  08/17/16 0131 08/17/16 0834 08/17/16 2125  TROPONINI 0.11* 0.22* 0.33*    BNP (last 3 results) No results for input(s): PROBNP in the last 8760 hours.  TSH No results for input(s): TSH in the last 8760 hours.  Lipid Panel     Component Value Date/Time   CHOL 103 08/17/2016 0134   TRIG 97 08/17/2016 0134   HDL 34 (L) 08/17/2016 0134   CHOLHDL 3.0 08/17/2016 0134   VLDL 19 08/17/2016 0134   LDLCALC 50 08/17/2016 0134     Hepatic Function Panel  Recent Labs  01/14/17 1930 02/15/17 1425 02/20/17 0845 02/21/17 0235  PROT 8.0 7.3 8.1 6.8  ALBUMIN 4.1 4.4 4.2 3.8  AST 23 16 44* 32  ALT 13* 8* 13* 17  ALKPHOS 165* 166* 160* 142*  BILITOT 0.6 0.9 2.6* 1.6*  BILIDIR 0.2  --   --   --   IBILI 0.4  --   --   --     Imaging: Echocardiogram 02/22/2017 - Severe concentric LV hypertrophy. Mildly reduced EF 40-45%   Moderate to severe biatrial enlargement   Moderate RV enlargement. Mild RV  dysfunction   Severe calcific mixed valvular disease   Moderate MR. Moderate TR   Trivial pericardial effusion. Cardiac Studies:  EKG: No new EKG today Afib w/RVR, underlying LBBB EKG 02/21/2017  ECHO:  Assessment/Plan:  1. Coronary artery disease/Unstable angina:     Single vessel obstructive CAD w/90% prox RCA stenosis likely culprit for episodes of acute on chronic diastolic heart failure.       Successful angioplasty and stent placement on 02/22/2017      Clopidogrel 600 mg intra procedure. Continue 75 mg daily for at least 6 months. Given high risk of bleeding with triple therapy and his ASA allergy, will skip ASA.   2. Afib w/RVR and brief episodes of nonsustained VT:     Underlying LBBB. Known hypertensive cardiomyopathy.       Majority of his episodes are Afib with RVR with LBBB and not NSVT.      Started on metoprolol xL 25 mg     CHA2DS2-VASc score 3 (Severe calcifications on femoral angiogram s/p peripheral vascular disease). Started on warfarin, pharmacy to dose.  Will check INR in clinic f/u visit for transition of care.  3. Heart failure with mildly reduced ejection fraction:     Long standing hypertensive cardiomyopathy. Possible secondary amyloid changes. I have counseled him about dietary and medication compliance.   4. Mixed valvular disease:     Severe calcific aortic and mitral valve. Continue current management for ESRD.  5. Thrombocytopenia:       Stable  6. Hyponatremia  7. Hep B,C  8. Tobacco and marijuana abuse   Ok to discharge from Cardiology standpoint. Follow up with me on 03/01/17 at 3:30 PM  Please contact if any questions. Thank you for the consult.    Nigel Mormon, M.D. 02/23/2017, 10:18 AM [facility], [place] Office: [phone] If no answer: [phone]

## 2017-02-23 NOTE — Discharge Summary (Addendum)
Physician Discharge Summary  ORRY SIGL UQJ:[id number] DOB: [birth year] DOA: 02/20/2017  PCP: Joshua Mccreedy, MD  Admit date: 02/20/2017 Discharge date: 02/23/2017  Time spent: > 35 minutes  Recommendations for Outpatient Follow-up:  1. Discussed with cardiology prior to discharge. He would like to try Coumadin on patient given his high risk of strokes. If on follow-up patient is noncompliant with Coumadin then plans will be to discuss risk versus benefit and possibly discontinue Coumadin. 2. Recommend follow up with nephrologist and cardiologist at coumadin clinic 3. Addendum: no statin recommended on d/c by cardiology. Will have patient readdress this on close f/u with cardiology as outpatient.   Discharge Diagnoses:  Active Problems:   Alcohol abuse   Cigarette smoker   Chronic hepatitis B (HCC)   Chronic hepatitis C without hepatic coma (HCC)   Thrombocytopenia (HCC)   End stage renal disease (HCC)   Fluid overload   COPD GOLD II/ still smoking   Essential hypertension   Discharge Condition: stable  Diet recommendation: renal diet  Filed Weights   02/23/17 0236 02/23/17 0730 02/23/17 1100  Weight: 74.7 kg (164 lb 10.9 oz) 74 kg (163 lb 2.3 oz) 72.8 kg (160 lb 7.9 oz)    History of present illness:   56 y.o. male with medical history significant for HTN, ESRD on HD MWF, d HF, anxiety, HCV and Hep B virus  Not on meds, medicine non compliance, tobacco and ETOH abuse, seen yesterday at the ED for SOB felt to be due to COPD exacerbation, now presenting with worsening SOB , persistent, diaphoretic, and describing chest tightness  Hospital Course:  Per cardiologist day of d/c  1. Coronary artery disease/Unstable angina:     Single vessel obstructive CAD w/90% prox RCA stenosis likely culprit for episodes of acute on chronic diastolic heart failure.       Successful angioplasty and stent placement on 02/22/2017      Clopidogrel 600 mg intra procedure. Continue 75 mg  daily for at least 6 months. Given high risk of bleeding with triple therapy and his ASA allergy, will skip ASA.   2. Afib w/RVR and brief episodes of nonsustained VT: Underlying LBBB. Known hypertensive cardiomyopathy.       Majority of his episodes are Afib with RVR with LBBB and not NSVT.      Started on metoprolol xL 25 mg CHA2DS2-VASc score 3 (Severe calcifications on femoral angiogram s/p peripheral vascular disease). Started on warfarin, pharmacy to dose. Will check INR in clinic f/u visit for transition of care.  3. Heart failure with mildly reduced ejection fraction:     Long standing hypertensive cardiomyopathy. Possible secondary amyloid changes. I have counseled him about dietary and medication compliance.   4. Mixed valvular disease:     Severe calcific aortic and mitral valve. Continue current management for ESRD.  5. Thrombocytopenia:       Stable  6. Hyponatremia  7. Hep B,C  8. Tobacco and marijuana abuse    Procedures:  Please see above  Consultations:  Cardiology  Nephrology  Discharge Exam: Vitals:   02/23/17 1100 02/23/17 1130  BP: 130/70 (!) 158/71  Pulse: 68 76  Resp: 18 18  Temp: 98 F (36.7 C) 97.8 F (36.6 C)  SpO2: 100% 95%    General: Pt in nad, alert and awake Cardiovascular: no cyanosis Respiratory: no increased wob, no wheezes  Discharge Instructions   Discharge Instructions    AMB Referral to Cardiac Rehabilitation - Phase II  Complete by:  As directed    Diagnosis:  NSTEMI   Call MD for:  severe uncontrolled pain    Complete by:  As directed    Call MD for:  temperature >100.4    Complete by:  As directed    Diet - low sodium heart healthy    Complete by:  As directed    Increase activity slowly    Complete by:  As directed      Current Discharge Medication List    START taking these medications   Details  clopidogrel (PLAVIX) 75 MG tablet Take 1 tablet (75 mg total) by mouth daily. Qty: 30  tablet, Refills: 0    metoprolol succinate (TOPROL-XL) 25 MG 24 hr tablet Take 1 tablet (25 mg total) by mouth daily. Qty: 30 tablet, Refills: 0    multivitamin (RENA-VIT) TABS tablet Take 1 tablet by mouth at bedtime. Qty: 30 each, Refills: 0    warfarin (COUMADIN) 5 MG tablet Take 1 tablet (5 mg total) by mouth daily at 6 PM. Qty: 20 tablet, Refills: 0      CONTINUE these medications which have NOT CHANGED   Details  albuterol (PROVENTIL HFA;VENTOLIN HFA) 108 (90 Base) MCG/ACT inhaler Inhale 2 puffs into the lungs every 6 (six) hours as needed for wheezing or shortness of breath.     budesonide-formoterol (SYMBICORT) 160-4.5 MCG/ACT inhaler Inhale 2 puffs into the lungs 2 (two) times daily.    CHANTIX 0.5 MG tablet Take 0.5 mg by mouth 2 (two) times daily. For thirty days Refills: 0    gabapentin (NEURONTIN) 100 MG capsule Take 100 mg by mouth up to two times a day Refills: 0    minoxidil (LONITEN) 10 MG tablet Take 10 mg by mouth daily.    oxyCODONE (OXY IR/ROXICODONE) 5 MG immediate release tablet Take 5 mg by mouth 2 (two) times daily as needed for pain. Refills: 0    polyethylene glycol powder (GLYCOLAX/MIRALAX) powder Take 17grams (1 capful) dissolved in at least 8 ounces of water or juice, once daily Qty: 527 g, Refills: 1    sevelamer carbonate (RENVELA) 800 MG tablet Take 2,400 mg by mouth 3 (three) times daily with meals.    acetaminophen (TYLENOL) 500 MG tablet Take 500-1,000 mg by mouth every 6 (six) hours as needed (for headaches and pain).    oxyCODONE-acetaminophen (PERCOCET) 5-325 MG tablet Take 1 tablet by mouth every 6 (six) hours as needed. Qty: 10 tablet, Refills: 0      STOP taking these medications     HYDROcodone-acetaminophen (NORCO/VICODIN) 5-325 MG tablet        Allergies  Allergen Reactions  . Aspirin Other (See Comments)    STOMACH PAIN  . Clonidine Derivatives Itching  . Tramadol Itching      The results of significant diagnostics  from this hospitalization (including imaging, microbiology, ancillary and laboratory) are listed below for reference.    Significant Diagnostic Studies: X-ray Chest Pa And Lateral  Result Date: 02/21/2017 CLINICAL DATA:  Shortness of Breath EXAM: CHEST  2 VIEW COMPARISON:  February 20, 2017 FINDINGS: There is slight interstitial edema. There is a minimal left pleural effusion. No consolidation. There is cardiomegaly with pulmonary venous hypertension. There is aortic atherosclerosis. There is no evident bone lesion. IMPRESSION: Evidence of a degree of congestive heart failure with cardiomegaly, pulmonary venous hypertension, and mild interstitial edema. There is a small pleural effusion. No consolidation. There is aortic atherosclerosis. Aortic Atherosclerosis (ICD10-I70.0). Electronically Signed   By:  Lowella Grip III M.D.   On: 02/21/2017 14:07   Dg Chest Portable 1 View  Result Date: 02/20/2017 CLINICAL DATA:  Shortness of Breath EXAM: PORTABLE CHEST 1 VIEW COMPARISON:  02/19/2017 FINDINGS: Cardiac shadow remains enlarged. Aortic calcifications are again. Increasing vascular congestion is noted without significant interstitial edema. No focal infiltrate is noted. IMPRESSION: Vascular congestion without significant edema. Electronically Signed   By: Inez Catalina M.D.   On: 02/20/2017 09:03   Dg Chest Port 1 View  Result Date: 02/19/2017 CLINICAL DATA:  Short of breath.  Dialysis patient EXAM: PORTABLE CHEST 1 VIEW COMPARISON:  01/14/2017 FINDINGS: Cardiac enlargement with mild vascular congestion. Negative for edema or effusion. Mild left lower lobe atelectasis unchanged. Atherosclerotic aortic arch IMPRESSION: Cardiac enlargement with mild vascular congestion. Negative for edema Mild left lower lobe atelectasis Electronically Signed   By: Franchot Gallo M.D.   On: 02/19/2017 16:02    Microbiology: Recent Results (from the past 240 hour(s))  Culture, blood (Routine X 2) w Reflex to ID Panel      Status: None (Preliminary result)   Collection Time: 02/20/17  5:10 PM  Result Value Ref Range Status   Specimen Description BLOOD RIGHT ANTECUBITAL  Final   Special Requests   Final    BOTTLES DRAWN AEROBIC AND ANAEROBIC Blood Culture adequate volume   Culture NO GROWTH 3 DAYS  Final   Report Status PENDING  Incomplete  Culture, blood (Routine X 2) w Reflex to ID Panel     Status: None (Preliminary result)   Collection Time: 02/20/17  5:15 PM  Result Value Ref Range Status   Specimen Description BLOOD RIGHT FOREARM  Final   Special Requests   Final    BOTTLES DRAWN AEROBIC AND ANAEROBIC Blood Culture adequate volume   Culture NO GROWTH 3 DAYS  Final   Report Status PENDING  Incomplete  MRSA PCR Screening     Status: None   Collection Time: 02/20/17  9:19 PM  Result Value Ref Range Status   MRSA by PCR NEGATIVE NEGATIVE Final    Comment:        The GeneXpert MRSA Assay (FDA approved for NASAL specimens only), is one component of a comprehensive MRSA colonization surveillance program. It is not intended to diagnose MRSA infection nor to guide or monitor treatment for MRSA infections.      Labs: Basic Metabolic Panel:  Recent Labs Lab 02/19/17 1550 02/20/17 0844 02/20/17 0845 02/20/17 1019 02/21/17 0235 02/23/17 0547  NA 131* 131* 134*  --  133* 129*  K 5.1 5.7* 6.0*  --  4.3 4.3  CL 88* 90* 87*  --  88* 91*  CO2 26  --  23  --  27 25  GLUCOSE 87 37* 40*  --  106* 87  BUN 29* 44* 37*  --  48* 25*  CREATININE 8.62* 10.10* 10.23*  --  11.70* 8.81*  CALCIUM 8.1*  --  8.9  --  8.3* 8.5*  MG  --   --   --  2.8*  --   --   PHOS  --   --   --   --  5.1*  --    Liver Function Tests:  Recent Labs Lab 02/20/17 0845 02/21/17 0235  AST 44* 32  ALT 13* 17  ALKPHOS 160* 142*  BILITOT 2.6* 1.6*  PROT 8.1 6.8  ALBUMIN 4.2 3.8   No results for input(s): LIPASE, AMYLASE in the last 168 hours. No results for  input(s): AMMONIA in the last 168 hours. CBC:  Recent  Labs Lab 02/19/17 1550 02/20/17 0844 02/20/17 0845 02/21/17 0235 02/23/17 0547  WBC 8.1  --  12.0* 7.8 5.3  NEUTROABS  --   --  7.3  --   --   HGB 11.8* 13.9 12.6* 11.2* 9.7*  HCT 34.1* 41.0 35.9* 32.1* 28.5*  MCV 84.8  --  84.7 83.2 83.6  PLT 59*  --  74* 86* 66*   Cardiac Enzymes: No results for input(s): CKTOTAL, CKMB, CKMBINDEX, TROPONINI in the last 168 hours. BNP: BNP (last 3 results)  Recent Labs  08/18/16 0941 08/29/16 2259 02/20/17 1706  BNP >4,500.0* >4,500.0* 3,193.8*    ProBNP (last 3 results) No results for input(s): PROBNP in the last 8760 hours.  CBG:  Recent Labs Lab 02/21/17 1958 02/22/17 0008 02/22/17 0415 02/22/17 0747 02/22/17 1223  GLUCAP 127* 123* 127* 105* 99     Signed:  Velvet Bathe MD.  [facility] 02/23/2017, 12:32 PM

## 2017-02-23 NOTE — Care Management Note (Signed)
Case Management Note  Patient Details  Name: [name] MRN: [id number] Date of Birth: [birth year]  Subjective/Objective:   From home, s/p coronary stent intervention,will be on plavix.                Action/Plan: NCM will follow for dc needs.   Expected Discharge Date:                  Expected Discharge Plan:     In-House Referral:     Discharge planning Services  CM Consult  Post Acute Care Choice:    Choice offered to:     DME Arranged:    DME Agency:     HH Arranged:    HH Agency:     Status of Service:  In process, will continue to follow  If discussed at Long Length of Stay Meetings, dates discussed:    Additional Comments:  Zenon Mayo, RN 02/23/2017, 12:21 PM

## 2017-02-23 NOTE — Procedures (Signed)
Pt seen on HD.  Af 150 VP 230.  BFR 450.  Note RCA angioplasty and starting of metoprolol.  Still in A fib.

## 2017-02-23 NOTE — Pre-Procedure Instructions (Signed)
Ok to discharge from Cardiology standpoint today. Follow up with me on 03/01/2017 at 3:30 PM in [place], [place]

## 2017-02-23 NOTE — Discharge Instructions (Signed)

## 2017-02-23 NOTE — Progress Notes (Signed)
Patient given percocet for complaint of right groin pain at 1330. Stated no pain when assessed earlier today. Groin assessed, area soft, no bruising or hematoma. No bleeding. bandaid dry and intact. Patient up ambulating with no complaint of pain and complains of pain when getting into bed. Checked several times this afternoon with no s/s complications. Patient stated vicodin did nothing for the pain, had percocet ordered and given to patient. Patient stated that the percocet was not any good for pain but he would take it anyway. No further complaints of pain after medicated. Site checked prior to discharge with no change. Pharmacist into see patient and review information on warfarin.  No follow up for cardiology on discharge paperwork.  Dr. Bonney Roussel office called, and will call patient with follow up appointment information.

## 2017-02-23 NOTE — Progress Notes (Signed)
1325-1350 Pt ready to go home. Standing in doorway. Discussed with pt the importance of plavix with stent. Reviewed how to use NTG tablets. Gave OFF the BEAT booklet and discussed why there is a need for eliquis.  Referral made to [facility] by cardiology. Pt stated he would not be able to tolerate program and he has dialysis on same days as program. Gave fake cigarette and smoking cessation handout. Encouraged smoking cessation and medication compliance. Refer to HD dietitian for diet teaching. Graylon Good RN BSN 02/23/2017 1:53 PM

## 2017-02-23 NOTE — Progress Notes (Signed)
Patient seen this morning. Stable on dialysis. Afib with periods of RVR and underlying LBBB. Starting on warfarin and plavix after PCI to RCA yesterday. Starting on metoprolol xL 25 mg  F/u w/me in [facility] on 03/01/17 at 3:30 PM  Vernell Leep, MD [facility]

## 2017-02-25 DIAGNOSIS — D509 Iron deficiency anemia, unspecified: Secondary | ICD-10-CM | POA: Diagnosis not present

## 2017-02-25 DIAGNOSIS — N2581 Secondary hyperparathyroidism of renal origin: Secondary | ICD-10-CM | POA: Diagnosis not present

## 2017-02-25 DIAGNOSIS — D631 Anemia in chronic kidney disease: Secondary | ICD-10-CM | POA: Diagnosis not present

## 2017-02-25 DIAGNOSIS — N186 End stage renal disease: Secondary | ICD-10-CM | POA: Diagnosis not present

## 2017-02-25 LAB — CULTURE, BLOOD (ROUTINE X 2)
Culture: NO GROWTH
Culture: NO GROWTH
Special Requests: ADEQUATE
Special Requests: ADEQUATE

## 2017-02-28 ENCOUNTER — Emergency Department (HOSPITAL_COMMUNITY)
Admission: EM | Admit: 2017-02-28 | Discharge: 2017-02-28 | Disposition: A | Discharge status: Home / Self Care | Payer: Medicare Other | Attending: Emergency Medicine | Admitting: Emergency Medicine

## 2017-02-28 ENCOUNTER — Encounter (HOSPITAL_COMMUNITY): Payer: Self-pay | Admitting: Emergency Medicine

## 2017-02-28 DIAGNOSIS — Y69 Unspecified misadventure during surgical and medical care: Secondary | ICD-10-CM | POA: Diagnosis not present

## 2017-02-28 DIAGNOSIS — T82838A Hemorrhage of vascular prosthetic devices, implants and grafts, initial encounter: Secondary | ICD-10-CM | POA: Diagnosis not present

## 2017-02-28 DIAGNOSIS — R58 Hemorrhage, not elsewhere classified: Secondary | ICD-10-CM

## 2017-02-28 DIAGNOSIS — Z992 Dependence on renal dialysis: Secondary | ICD-10-CM | POA: Diagnosis not present

## 2017-02-28 DIAGNOSIS — I2510 Atherosclerotic heart disease of native coronary artery without angina pectoris: Secondary | ICD-10-CM | POA: Diagnosis not present

## 2017-02-28 DIAGNOSIS — I4891 Unspecified atrial fibrillation: Secondary | ICD-10-CM | POA: Diagnosis not present

## 2017-02-28 DIAGNOSIS — N2581 Secondary hyperparathyroidism of renal origin: Secondary | ICD-10-CM | POA: Diagnosis not present

## 2017-02-28 DIAGNOSIS — F17210 Nicotine dependence, cigarettes, uncomplicated: Secondary | ICD-10-CM | POA: Insufficient documentation

## 2017-02-28 DIAGNOSIS — D509 Iron deficiency anemia, unspecified: Secondary | ICD-10-CM | POA: Diagnosis not present

## 2017-02-28 DIAGNOSIS — J449 Chronic obstructive pulmonary disease, unspecified: Secondary | ICD-10-CM | POA: Insufficient documentation

## 2017-02-28 DIAGNOSIS — I499 Cardiac arrhythmia, unspecified: Secondary | ICD-10-CM | POA: Diagnosis not present

## 2017-02-28 DIAGNOSIS — Z79899 Other long term (current) drug therapy: Secondary | ICD-10-CM | POA: Insufficient documentation

## 2017-02-28 DIAGNOSIS — I120 Hypertensive chronic kidney disease with stage 5 chronic kidney disease or end stage renal disease: Secondary | ICD-10-CM | POA: Diagnosis not present

## 2017-02-28 DIAGNOSIS — D631 Anemia in chronic kidney disease: Secondary | ICD-10-CM | POA: Diagnosis not present

## 2017-02-28 DIAGNOSIS — N186 End stage renal disease: Secondary | ICD-10-CM | POA: Insufficient documentation

## 2017-02-28 NOTE — Discharge Instructions (Signed)
It was my pleasure taking care of you today!   Keep dressing on fistula site for the next 12 hours.   Return to ER new /worsening symptoms or if you are unable to control bleeding at home.   I would REALLY like you to discuss your frustrations with your chronic illness with your primary care provider.

## 2017-02-28 NOTE — ED Notes (Signed)
Pt's fistula dressed with quick clot combat gauze (3x4), 2 4X4s, and 2-in coban.  Pt sts the coban is not too tight.  Expresses desire to leave at this time.

## 2017-02-28 NOTE — ED Triage Notes (Addendum)
Per EMS: Pt had dialysis done tonight. Pt takes bandage off his fistula and it continues to bleed. Pt out of dialysis at 1100. Bleeding controlled in triage.  Pt c/o SOB. Hx of COPD. Pt taking warfarin and clopidogrel b/c he had an MI last Sunday. Pt states he has thought about taking his own life due to frustration w/ how his life is going. Pt states "if something happens to me, just let me go.  BP: 164/117 HR: 94  CBG: 166

## 2017-02-28 NOTE — ED Provider Notes (Signed)
[facility] DEPT Provider Note   CSN: [id number] Arrival date & time: 02/28/17  2122     History   Chief Complaint Chief Complaint  Patient presents with  . Bleeding/Bruising  . Suicidal    HPI [name] is a 56 y.o. male.  The history is provided by the patient and medical records. No language interpreter was used.   [name] is a 56 y.o. male  with a PMH of ESRD on dialysis, COPD, CAD who presents to the Emergency Department complaining of bleeding from fistula site that began today. Patient went to dialysis and received full treatment as usual. When he came home, blood was "oozing" out of his fistula. It seems to stop when he has a bandage covering it, but when he takes the bandage off, bleeding returns. He is on Warfarin and Plavix. Patient states that he is very frustrated with having to be on dialysis and constantly being sick. He has thought about just not going to dialysis, but states that he "doesn't have the heart to do it". He states that he is just tired of dealing with being sick and told to "hang in there", knowing that he won't get back to a better health. He states that if something bad happened to him, he would just want to die, but wouldn't do anything to hurt himself intentionally as his family would not forgive him. No homicidal thoughts or auditory / visual hallucinations. No prior suicide attempts or history of mental illness.    Past Medical History:  Diagnosis Date  . Adenomatous colon polyp    tubular  . Anemia   . Anxiety   . Arthritis    left shoulder  . Atherosclerosis of aorta ([place])   . Cardiomegaly   . Chest pain    DATE UNKNOWN, C/O PERIODICALLY  . Chronic kidney disease    dialysis - M/W/F  . Cocaine abuse   . COPD exacerbation ([place]) 08/17/2016  . Dialysis patient ([place])    Monday-Wednesday-Friday  . GERD (gastroesophageal reflux disease)    DATE UNKNOWN  . Hemorrhoids   . Hepatitis B, chronic ([place])   . Hepatitis C   .  Hyperkalemia   . Hypertension   . Metabolic bone disease    Patient denies  . Nephrolithiasis   . Pneumonia   . Pulmonary edema   . Renal disorder   . Renal insufficiency   . Shortness of breath dyspnea    " for the last past year with this dialysis"  . Tubular adenoma of colon     Patient Active Problem List   Diagnosis Date Noted  . COPD GOLD II/ still smoking 09/27/2016  . Essential hypertension 09/27/2016  . Fluid overload 08/30/2016  . Acute respiratory failure with hypoxia ([place]) 08/17/2016  . COPD exacerbation ([place]) 08/17/2016  . Hypertensive urgency 08/17/2016  . Acute bronchitis 08/17/2016  . Elevated troponin 08/17/2016  . Respiratory failure ([place]) 08/17/2016  . Problem with dialysis access ([place]) 07/23/2016  . End stage renal disease ([place]) 12/02/2015  . Chronic hepatitis B ([place]) 03/05/2014  . Chronic hepatitis C without hepatic coma ([place]) 03/05/2014  . Internal hemorrhoids with bleeding, swelling and itching 03/05/2014  . Thrombocytopenia ([place]) 03/05/2014  . Chest pain 02/27/2014  . Alcohol abuse 04/14/2009  . Cigarette smoker 04/14/2009  . GANGLION CYST 04/14/2009    Past Surgical History:  Procedure Laterality Date  . AV FISTULA PLACEMENT  2012   BELIEVED WAS PLACED IN JUNE  . COLONOSCOPY    .  CORONARY STENT INTERVENTION N/A 02/22/2017   Procedure: CORONARY STENT INTERVENTION;  Surgeon: Nigel Mormon, MD;  Location: [facility];  Service: Cardiovascular;  Laterality: N/A;  . HEMORRHOID BANDING    . LEFT HEART CATH AND CORONARY ANGIOGRAPHY N/A 02/22/2017   Procedure: LEFT HEART CATH AND CORONARY ANGIOGRAPHY;  Surgeon: Nigel Mormon, MD;  Location: [facility];  Service: Cardiovascular;  Laterality: N/A;  . LIGATION OF ARTERIOVENOUS  FISTULA Left 10/12/3293   Procedure: Plication of Left Arm Arteriovenous Fistula;  Surgeon: Elam Dutch, MD;  Location: [facility];  Service: Vascular;  Laterality: Left;  . POLYPECTOMY    . REVISON OF  ARTERIOVENOUS FISTULA Left 1/88/4166   Procedure: PLICATION OF DISTAL ANEURYSMAL SEGEMENT OF LEFT UPPER ARM ARTERIOVENOUS FISTULA;  Surgeon: Elam Dutch, MD;  Location: [facility];  Service: Vascular;  Laterality: Left;  . REVISON OF ARTERIOVENOUS FISTULA Left 0/63/0160   Procedure: Plication of Left Upper Arm Fistula ;  Surgeon: Waynetta Sandy, MD;  Location: [facility] OR;  Service: Vascular;  Laterality: Left;       Home Medications    Prior to Admission medications   Medication Sig Start Date End Date Taking? Authorizing Provider  acetaminophen (TYLENOL) 500 MG tablet Take 500-1,000 mg by mouth every 6 (six) hours as needed (for headaches and pain).    [provider]  albuterol (PROVENTIL HFA;VENTOLIN HFA) 108 (90 Base) MCG/ACT inhaler Inhale 2 puffs into the lungs every 6 (six) hours as needed for wheezing or shortness of breath.     [provider]  budesonide-formoterol (SYMBICORT) 160-4.5 MCG/ACT inhaler Inhale 2 puffs into the lungs 2 (two) times daily.    [provider]  CHANTIX 0.5 MG tablet Take 0.5 mg by mouth 2 (two) times daily. For thirty days 01/26/17   [provider]  clopidogrel (PLAVIX) 75 MG tablet Take 1 tablet (75 mg total) by mouth daily. 02/24/17   Velvet Bathe, MD  gabapentin (NEURONTIN) 100 MG capsule Take 100 mg by mouth up to two times a day 01/06/17   [provider]  metoprolol succinate (TOPROL-XL) 25 MG 24 hr tablet Take 1 tablet (25 mg total) by mouth daily. 02/24/17   Velvet Bathe, MD  minoxidil (LONITEN) 10 MG tablet Take 10 mg by mouth daily.    [provider]  multivitamin (RENA-VIT) TABS tablet Take 1 tablet by mouth at bedtime. 02/23/17   Velvet Bathe, MD  oxyCODONE (OXY IR/ROXICODONE) 5 MG immediate release tablet Take 5 mg by mouth 2 (two) times daily as needed for pain. 02/03/17   [provider]  oxyCODONE-acetaminophen (PERCOCET) 5-325 MG tablet Take 1 tablet by mouth every 6 (six)  hours as needed. Patient not taking: Reported on 02/20/2017 01/14/17   Milton Ferguson, MD  polyethylene glycol powder (GLYCOLAX/MIRALAX) powder Take 17grams (1 capful) dissolved in at least 8 ounces of water or juice, once daily 01/13/17   Pyrtle, Lajuan Lines, MD  sevelamer carbonate (RENVELA) 800 MG tablet Take 2,400 mg by mouth 3 (three) times daily with meals.    [provider]  warfarin (COUMADIN) 5 MG tablet Take 1 tablet (5 mg total) by mouth daily at 6 PM. 02/23/17   Velvet Bathe, MD    Family History Family History  Problem Relation Age of Onset  . Heart disease Mother   . Lung cancer Mother   . Heart disease Father   . Malignant hyperthermia Father   . Throat cancer Sister   .  Esophageal cancer Sister   . Hypertension Other   . COPD Other   . Colon cancer Neg Hx   . Colon polyps Neg Hx   . Rectal cancer Neg Hx   . Stomach cancer Neg Hx     Social History Social History  Substance Use Topics  . Smoking status: Current Every Day Smoker    Packs/day: 1.00    Years: 43.00    Types: Cigarettes  . Smokeless tobacco: Never Used     Comment: 2-3 cigarettes per day  . Alcohol use 2.4 oz/week    1 Glasses of wine, 1 Cans of beer, 1 Shots of liquor, 1 Standard drinks or equivalent per week     Comment: occasional      Allergies   Aspirin; Clonidine derivatives; and Tramadol   Review of Systems Review of Systems  Skin: Positive for wound.  All other systems reviewed and are negative.    Physical Exam Updated Vital Signs BP (!) 159/86   Pulse 67   Temp 98.2 F (36.8 C) (Oral)   Resp 18   Ht 5\' 9"  (1.753 m)   Wt 68.9 kg (152 lb)   SpO2 100%   BMI 22.45 kg/m   Physical Exam  Constitutional: He is oriented to person, place, and time. He appears well-developed and well-nourished. No distress.  HENT:  Head: Normocephalic and atraumatic.  Cardiovascular: Normal rate, regular rhythm and normal heart sounds.   No murmur heard. Pulmonary/Chest: Effort normal and  breath sounds normal. No respiratory distress.  Abdominal: Soft. He exhibits no distension. There is no tenderness.  Neurological: He is alert and oriented to person, place, and time.  Skin: Skin is warm and dry.  Fistula to left upper extremity with good thrill. Pinpoint bleeding superficially overlying fistula.  Psychiatric: He has a normal mood and affect. His behavior is normal. Judgment and thought content normal.  Nursing note and vitals reviewed.   ED Treatments / Results  Labs (all labs ordered are listed, but only abnormal results are displayed) Labs Reviewed - No data to display  EKG  EKG Interpretation None       Radiology No results found.  Procedures Procedures (including critical care time)  Medications Ordered in ED Medications - No data to display   Initial Impression / Assessment and Plan / ED Course  I have reviewed the triage vital signs and the nursing notes.  Pertinent labs & imaging results that were available during my care of the patient were reviewed by me and considered in my medical decision making (see chart for details).    [name] is a 56 y.o. male who presents to ED for pinpoint bleeding from fistula site that began today. Fistula with good thrill, bleeding superficial. Quick clot applied. Patient brought up concerns to nursing staff about being frustrated with chronic illness. He was recently hospitalized for unstable angina and also must attend dialysis 3x per week. I spoke with the patient at length about his frustrations. Upon further questioning, I feel patient is having normal emotions for dealing with his chronic illness. He has good insight into his medical condition and is aware that he will not be able to get back to his usual good state of health. He is annoyed by the limitations and time spent in dialysis each week. While he states that he has thought about just not going to dialysis, he also states that he "doesn't have the  heart to do that". No history of  mental illness or prior suicide attempts. He has normal judgement and thought content is appropriate. Patient has a primary care physician as well as nephrologist and cardiologist. He has been compliant with dialysis treatments. After our lengthy discussion, he is considering DNR papers, but does not express active suicidal thoughts. I feel that patient is safe for discharge with close PCP follow up. I discussed these concerns with Dr. Rex Kras who also agrees patient is safe and stable for discharge.    Final Clinical Impressions(s) / ED Diagnoses   Final diagnoses:  Bleeding    New Prescriptions New Prescriptions   No medications on file     Nikodem Leadbetter, Ozella Almond, PA-C 02/28/17 2322    Little, Wenda Overland, MD 03/01/17 1216

## 2017-03-01 ENCOUNTER — Encounter: Payer: Self-pay | Admitting: Internal Medicine

## 2017-03-01 ENCOUNTER — Ambulatory Visit (INDEPENDENT_AMBULATORY_CARE_PROVIDER_SITE_OTHER): Payer: Medicare Other | Admitting: Internal Medicine

## 2017-03-01 VITALS — BP 156/105 | HR 63 | Temp 98.2°F | Wt 159.0 lb

## 2017-03-01 DIAGNOSIS — G8929 Other chronic pain: Secondary | ICD-10-CM | POA: Diagnosis not present

## 2017-03-01 DIAGNOSIS — B182 Chronic viral hepatitis C: Secondary | ICD-10-CM

## 2017-03-01 DIAGNOSIS — M79602 Pain in left arm: Secondary | ICD-10-CM | POA: Diagnosis not present

## 2017-03-01 DIAGNOSIS — B181 Chronic viral hepatitis B without delta-agent: Secondary | ICD-10-CM | POA: Diagnosis not present

## 2017-03-01 NOTE — Assessment & Plan Note (Addendum)
No considered cured.   Can follow up PRN

## 2017-03-01 NOTE — Progress Notes (Signed)
   Subjective:    Patient ID: [name], male    DOB: 1961/03/13, 56 y.o.   MRN: [id number]  HPI Here for follow up of HCV He is co infected with hepatitis B and started and now completed [place].  Initial viral load undetectable.  Here with cirrhosis and recent SVR12 undetectable.  His hepatitis B DNA also <20, though detected.  Having issues with breathing.  Still smokes.     Review of Systems  Constitutional: Negative for fatigue.  Respiratory: Positive for cough and shortness of breath.        Objective:   Physical Exam  Constitutional: He appears well-developed and well-nourished. No distress.  Eyes: No scleral icterus.  Cardiovascular: Normal rate, regular rhythm and normal heart sounds.   No murmur heard. Pulmonary/Chest: Effort normal and breath sounds normal. No respiratory distress.  Skin: No rash noted.          Assessment & Plan:

## 2017-03-01 NOTE — Assessment & Plan Note (Signed)
DNA minimaly detected.  He does have cirrhosis but I don't suspect related to hepatitis B with that low of DNA and with his alcohol and hepatitis C history. Therefore no indication for treatment.

## 2017-03-02 DIAGNOSIS — D631 Anemia in chronic kidney disease: Secondary | ICD-10-CM | POA: Diagnosis not present

## 2017-03-02 DIAGNOSIS — N186 End stage renal disease: Secondary | ICD-10-CM | POA: Diagnosis not present

## 2017-03-02 DIAGNOSIS — N2581 Secondary hyperparathyroidism of renal origin: Secondary | ICD-10-CM | POA: Diagnosis not present

## 2017-03-02 DIAGNOSIS — D509 Iron deficiency anemia, unspecified: Secondary | ICD-10-CM | POA: Diagnosis not present

## 2017-03-03 ENCOUNTER — Ambulatory Visit: Payer: Medicare Other | Admitting: Vascular Surgery

## 2017-03-04 DIAGNOSIS — N186 End stage renal disease: Secondary | ICD-10-CM | POA: Diagnosis not present

## 2017-03-04 DIAGNOSIS — D509 Iron deficiency anemia, unspecified: Secondary | ICD-10-CM | POA: Diagnosis not present

## 2017-03-04 DIAGNOSIS — N2581 Secondary hyperparathyroidism of renal origin: Secondary | ICD-10-CM | POA: Diagnosis not present

## 2017-03-04 DIAGNOSIS — D631 Anemia in chronic kidney disease: Secondary | ICD-10-CM | POA: Diagnosis not present

## 2017-03-04 IMAGING — DX DG ELBOW COMPLETE 3+V*L*
4 series · 4 of 4 positions shown · non-contrast
Comparison: None.

CLINICAL DATA: 55-year-old male with left upper extremity pain.
History of arteriovenous fistula.

EXAM:
LEFT ELBOW - COMPLETE 3+ VIEW

[elbow ap]
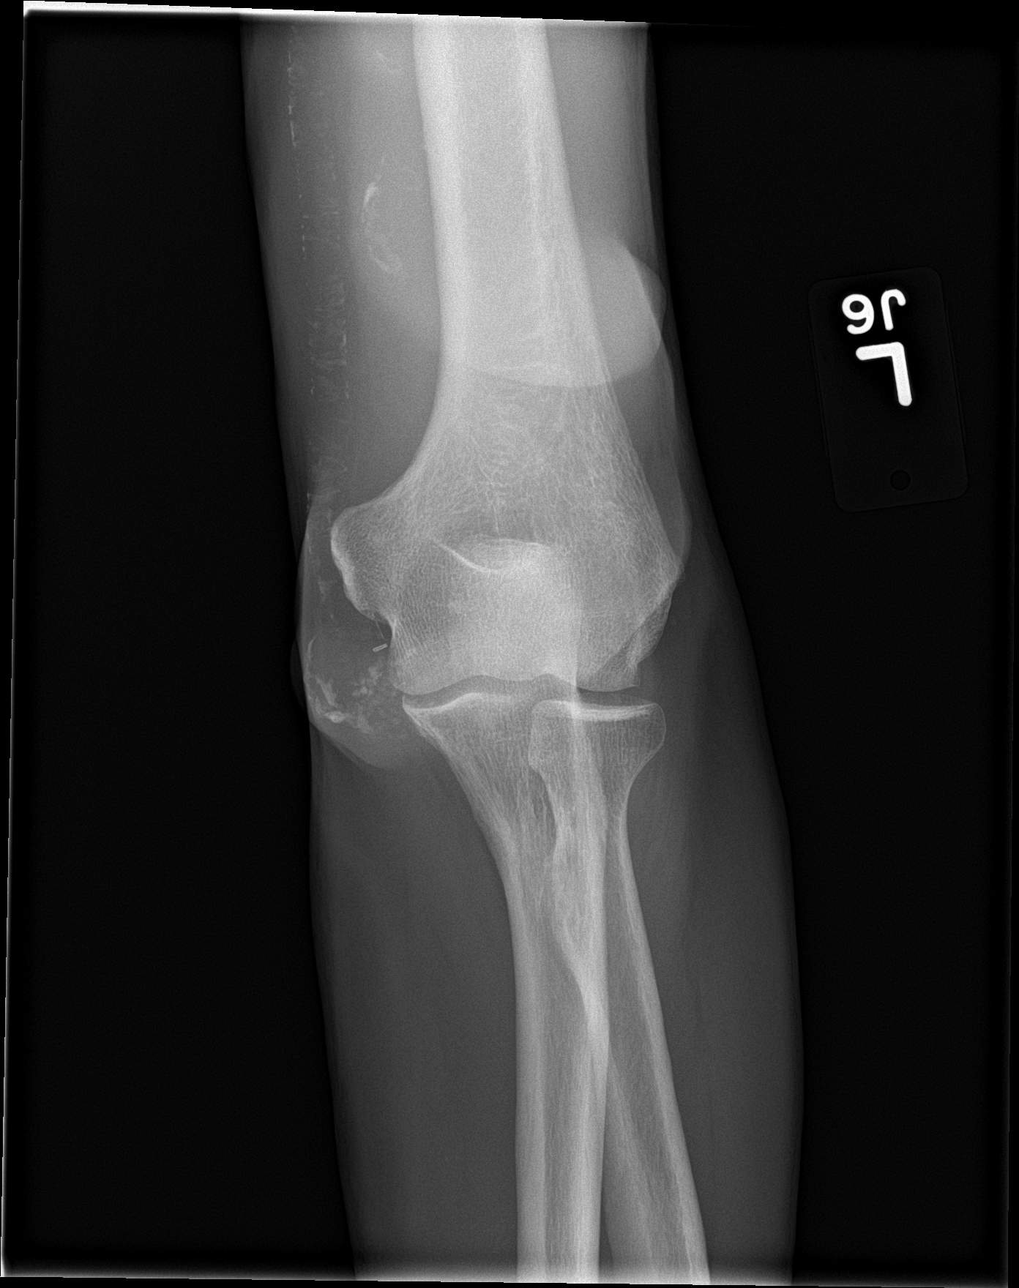

[elbow obl (1 of 2)]
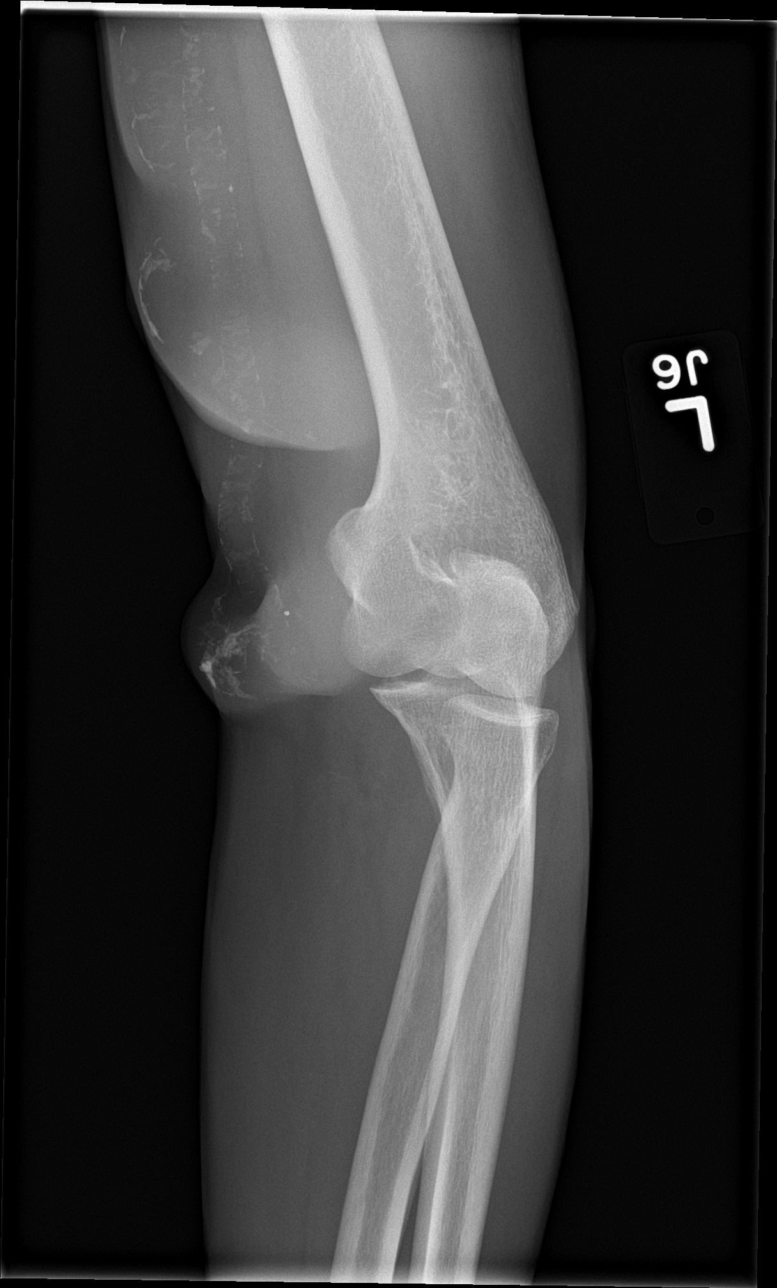

[elbow obl (2 of 2)]
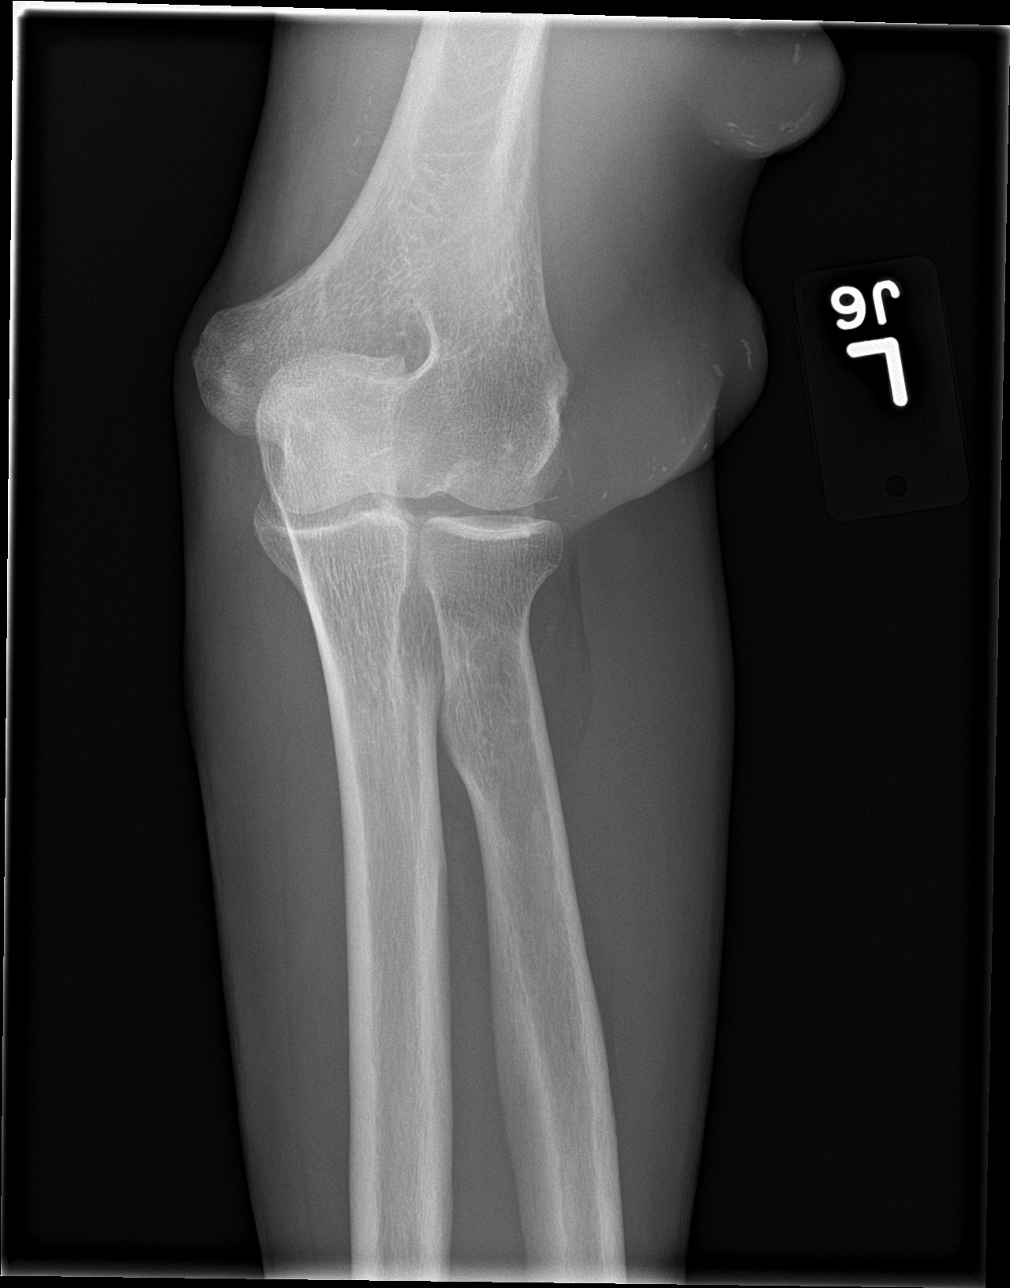

[elbow lat]
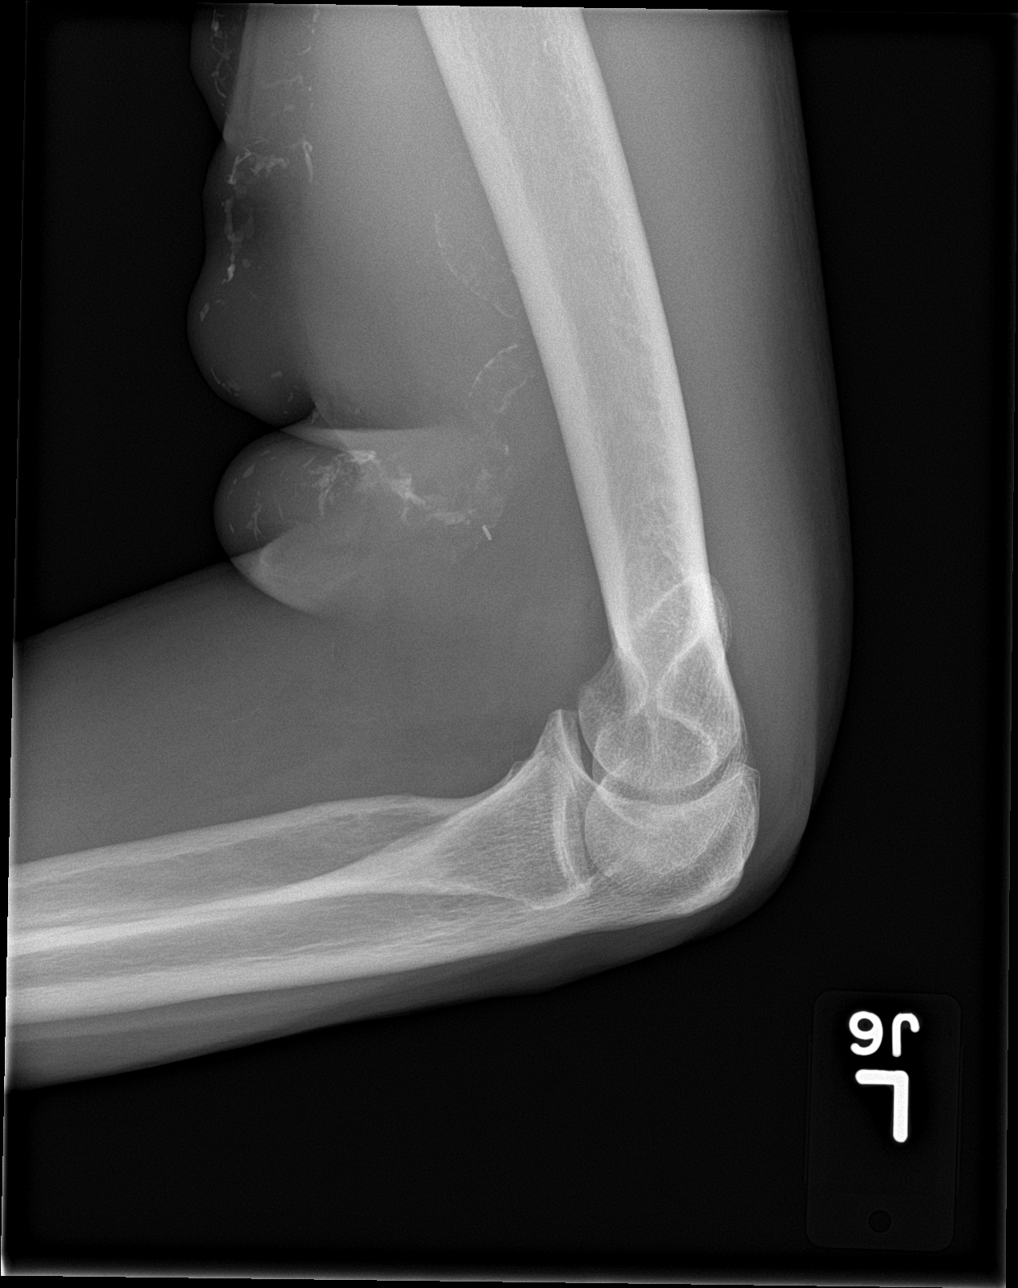

[4 of 4 positions shown; findings below may reference images not displayed]

FINDINGS: No evidence of acute fracture, malalignment or joint effusion.
Normal bony mineralization. No lytic or blastic osseous lesion.
Arterial and venous atherosclerotic vascular calcifications are
noted along the course of the brachial artery N the aneurysmal
venous segment. There are at least 3 peripheral calcifications
consistent with venous aneurysms. Soft tissues are otherwise
unremarkable.
IMPRESSION: 1. No evidence of acute fracture or malalignment.
2. Aneurysmal left upper arm arteriovenous fistula with mild
peripheral calcifications.

## 2017-03-07 DIAGNOSIS — D509 Iron deficiency anemia, unspecified: Secondary | ICD-10-CM | POA: Diagnosis not present

## 2017-03-07 DIAGNOSIS — N186 End stage renal disease: Secondary | ICD-10-CM | POA: Diagnosis not present

## 2017-03-07 DIAGNOSIS — N2581 Secondary hyperparathyroidism of renal origin: Secondary | ICD-10-CM | POA: Diagnosis not present

## 2017-03-07 DIAGNOSIS — D631 Anemia in chronic kidney disease: Secondary | ICD-10-CM | POA: Diagnosis not present

## 2017-03-09 DIAGNOSIS — D631 Anemia in chronic kidney disease: Secondary | ICD-10-CM | POA: Diagnosis not present

## 2017-03-09 DIAGNOSIS — D509 Iron deficiency anemia, unspecified: Secondary | ICD-10-CM | POA: Diagnosis not present

## 2017-03-09 DIAGNOSIS — N186 End stage renal disease: Secondary | ICD-10-CM | POA: Diagnosis not present

## 2017-03-09 DIAGNOSIS — N2581 Secondary hyperparathyroidism of renal origin: Secondary | ICD-10-CM | POA: Diagnosis not present

## 2017-03-10 ENCOUNTER — Ambulatory Visit (INDEPENDENT_AMBULATORY_CARE_PROVIDER_SITE_OTHER): Payer: Medicare Other | Admitting: Pulmonary Disease

## 2017-03-10 ENCOUNTER — Encounter: Payer: Self-pay | Admitting: Pulmonary Disease

## 2017-03-10 ENCOUNTER — Other Ambulatory Visit: Payer: Medicare Other

## 2017-03-10 VITALS — BP 134/76 | HR 100 | Ht 69.0 in | Wt 158.8 lb

## 2017-03-10 DIAGNOSIS — J449 Chronic obstructive pulmonary disease, unspecified: Secondary | ICD-10-CM

## 2017-03-10 DIAGNOSIS — F17210 Nicotine dependence, cigarettes, uncomplicated: Secondary | ICD-10-CM

## 2017-03-10 DIAGNOSIS — F17200 Nicotine dependence, unspecified, uncomplicated: Secondary | ICD-10-CM | POA: Diagnosis not present

## 2017-03-10 MED ORDER — AEROCHAMBER PLUS FLO-VU MEDIUM MISC
1.0000 | Freq: Once | Status: DC
Start: 2017-03-10 — End: 2017-03-26

## 2017-03-10 MED ORDER — AEROCHAMBER MV MISC: 2 refills | Status: DC

## 2017-03-10 MED ORDER — ALBUTEROL SULFATE HFA 108 (90 BASE) MCG/ACT IN AERS: 2.0000 | INHALATION_SPRAY | Freq: Four times a day (QID) | RESPIRATORY_TRACT | 6 refills | Status: DC | PRN

## 2017-03-10 NOTE — Progress Notes (Signed)
Subjective:    Patient ID: [name], male    DOB: [birth year], 56 y.o.   MRN: [id number]  C.C.:  Follow-up for Mild COPD & Tobacco Use Disorder.  HPI Patient previously seen by Dr. Melvyn Novas & requested to switch providers. Known history of chronic hepatitis A and hepatitis B viral infections followed by ID. Also known history of alcohol abuse. Followed by nephrology as well. He reports increased cough recently that is nonproductive. Minimal wheezing.   Mild COPD:  He reports his dyspnea is worse with increased heat & humidity. He is prescribed Symbicort but is only using it "as needed". He doesn't have a rescue inhaler.   Tobacco use disorder:  He reports he is smoking 7-8 cigarettes daily. He reports previously he was on Chantix. Reports he smokes mostly after he eats or drinks a cup of coffee. He does want to quit smoking.   Review of Systems No chest pain or pressure. No fever or chills. No abdominal pain or nausea.   Allergies  Allergen Reactions  . Aspirin Other (See Comments)    STOMACH PAIN  . Clonidine Derivatives Itching  . Tramadol Itching    Current Outpatient Prescriptions on File Prior to Visit  Medication Sig Dispense Refill  . acetaminophen (TYLENOL) 500 MG tablet Take 500-1,000 mg by mouth every 6 (six) hours as needed (for headaches and pain).    Marland Kitchen albuterol (PROVENTIL HFA;VENTOLIN HFA) 108 (90 Base) MCG/ACT inhaler Inhale 2 puffs into the lungs every 6 (six) hours as needed for wheezing or shortness of breath.     . budesonide-formoterol (SYMBICORT) 160-4.5 MCG/ACT inhaler Inhale 2 puffs into the lungs 2 (two) times daily.    . clopidogrel (PLAVIX) 75 MG tablet Take 1 tablet (75 mg total) by mouth daily. 30 tablet 0  . gabapentin (NEURONTIN) 100 MG capsule Take 100 mg by mouth up to two times a day  0  . oxyCODONE (OXY IR/ROXICODONE) 5 MG immediate release tablet Take 5 mg by mouth 2 (two) times daily as needed for pain.  0  . polyethylene glycol powder  (GLYCOLAX/MIRALAX) powder Take 17grams (1 capful) dissolved in at least 8 ounces of water or juice, once daily 527 g 1  . sevelamer carbonate (RENVELA) 800 MG tablet Take 2,400 mg by mouth 3 (three) times daily with meals.     No current facility-administered medications on file prior to visit.     Past Medical History:  Diagnosis Date  . Adenomatous colon polyp    tubular  . Anemia   . Anxiety   . Arthritis    left shoulder  . Atherosclerosis of aorta ([place])   . Cardiomegaly   . Chest pain    DATE UNKNOWN, C/O PERIODICALLY  . Chronic kidney disease    dialysis - M/W/F  . Cocaine abuse   . COPD exacerbation ([place]) 08/17/2016  . Dialysis patient ([place])    Monday-Wednesday-Friday  . GERD (gastroesophageal reflux disease)    DATE UNKNOWN  . Hemorrhoids   . Hepatitis B, chronic ([place])   . Hepatitis C   . Hyperkalemia   . Hypertension   . Metabolic bone disease    Patient denies  . Nephrolithiasis   . Pneumonia   . Pulmonary edema   . Renal disorder   . Renal insufficiency   . Shortness of breath dyspnea    " for the last past year with this dialysis"  . Tubular adenoma of colon     Past Surgical History:  Procedure Laterality Date  . AV FISTULA PLACEMENT  2012   BELIEVED WAS PLACED IN JUNE  . COLONOSCOPY    . CORONARY STENT INTERVENTION N/A 02/22/2017   Procedure: CORONARY STENT INTERVENTION;  Surgeon: Nigel Mormon, MD;  Location: [place] [facility];  Service: Cardiovascular;  Laterality: N/A;  . HEMORRHOID BANDING    . LEFT HEART CATH AND CORONARY ANGIOGRAPHY N/A 02/22/2017   Procedure: LEFT HEART CATH AND CORONARY ANGIOGRAPHY;  Surgeon: Nigel Mormon, MD;  Location: [place] [facility];  Service: Cardiovascular;  Laterality: N/A;  . LIGATION OF ARTERIOVENOUS  FISTULA Left 12/18/4852   Procedure: Plication of Left Arm Arteriovenous Fistula;  Surgeon: Elam Dutch, MD;  Location: [place];  Service: Vascular;  Laterality: Left;  . POLYPECTOMY    . REVISON OF  ARTERIOVENOUS FISTULA Left 01/05/349   Procedure: PLICATION OF DISTAL ANEURYSMAL SEGEMENT OF LEFT UPPER ARM ARTERIOVENOUS FISTULA;  Surgeon: Elam Dutch, MD;  Location: [place];  Service: Vascular;  Laterality: Left;  . REVISON OF ARTERIOVENOUS FISTULA Left 0/93/8182   Procedure: Plication of Left Upper Arm Fistula ;  Surgeon: Waynetta Sandy, MD;  [facility];  Service: Vascular;  Laterality: Left;    Family History  Problem Relation Age of Onset  . Heart disease Mother   . Lung cancer Mother   . Heart disease Father   . Malignant hyperthermia Father   . COPD Father   . Throat cancer Sister   . Esophageal cancer Sister   . Hypertension Other   . COPD Other   . Colon cancer Neg Hx   . Colon polyps Neg Hx   . Rectal cancer Neg Hx   . Stomach cancer Neg Hx     Social History   Social History  . Marital status: Single    Spouse name: N/A  . Number of children: 3  . Years of education: 10   Occupational History  . Unemployed    Social History Main Topics  . Smoking status: Current Every Day Smoker    Packs/day: 0.50    Years: 43.00    Types: Cigarettes    Start date: 08/13/1973  . Smokeless tobacco: Never Used     Comment: Peak rate of 1ppd  . Alcohol use 2.4 oz/week    1 Glasses of wine, 1 Cans of beer, 1 Shots of liquor, 1 Standard drinks or equivalent per week     Comment: occasional   . Drug use: Yes    Types: Marijuana, Cocaine  . Sexual activity: Not Asked     Comment: "NOT LATELY" on cocaine   Other Topics Concern  . None   Social History Narrative   Lives alone   Caffeine use: Coffee-rare   Soda- daily      [facility] (03/10/17):   Originally from [facility]. Previously worked trimming trees. No pets currently. No bird or mold exposure.          Objective:   Physical Exam BP 134/76 (BP Location: Right Arm, Cuff Size: Normal)   Pulse 100   Ht 5\' 9"  (1.753 m)   Wt 158 lb 12.8 oz (72 kg)   SpO2 97%   BMI 23.45 kg/m  General:  Awake.  Alert. African-American male. Integument:  Warm & dry. No rash on exposed skin. No bruising on exposed skin. Extremities:  No cyanosis or clubbing.  HEENT:  Moist mucus membranes. Minimal nasal turbinate swelling. No oral ulcers Cardiovascular:  Regular rate. No edema.  Unable to appreciate JVD.  Pulmonary:  Symmetrically decreased aeration. Otherwise clear to auscultation. Normal work of breathing on room air. Abdomen: Soft. Normal bowel sounds. Mildly protuberant Musculoskeletal:  Normal bulk and tone. No joint deformity or effusion appreciated.  PFT 09/27/16: FVC 3.41 L (86%) FEV1 2.20 L (70%) FEV1/FVC 0.64 FEF 25-75 1.23 L (40%)  IMAGING CXR PA/LAT 02/21/17 (personally reviewed by me):  No parenchymal mass or opacity appreciated. No pleural effusion appreciated. Pulmonary vascular congestion. Cardiomegaly. Chronic elevation of left hemidiaphragm. Mediastinum otherwise normal in contour.  CARDIAC TTE (02/21/17):  LV normal in size with severe concentric hypertrophy. EF 40-45% with diffuse hypokinesis. Insufficient to assess diastolic function. LA severely dilated & RA moderately dilated. RV moderately dilated with mild reduction in systolic function. Unable to obtain data on aortic valve. Moderate aortic stenosis. Paradoxical septal motion. No significant pulmonic regurgitation. Severe tricuspid regurgitation. Trivial pericardial effusion.    Assessment & Plan:  56 y.o. male with mild COPD based upon previous spirometry from earlier this year reviewed by me. Patient is not utilizing his inhalers with appropriate frequency or purpose. Discussed his ongoing tobacco use at length and given the intermittent nature I feel that nicotine supplementation intermittently may be of benefit. We will need to ensure that we have an accurate picture of his pulmonary function testing as well as discern whether or not he needs oxygen therapy. I instructed the patient to contact me if he had any new breathing  problems or questions before his next appointment.  1. Mild COPD:  Screening for alpha-1 antitrypsin deficiency. Checking full pulmonary function testing as well as 6 Minute walk test on room air before next appointment. Given spacer to use with his Symbicort inhaler. Patient prescribed albuterol inhaler to use as a rescue medication. Educated on proper frequency of inhaler medicines. 2. Tobacco use disorder:  Patient counseled for over 3 minutes and need for complete tobacco cessation. 3. Health maintenance:  Reports he previously had a Pneumonia Vaccine. Previous adverse reaction to Flu Vaccine last year.  4. Follow-up: Patient to return to clinic in 6 months or sooner if needed.  Sonia Baller Ashok Cordia, M.D. [facility] Pager:  [phone] After 3pm or if no response, call [phone] 12:26 PM 03/10/17

## 2017-03-10 NOTE — Patient Instructions (Addendum)
   I'm sending in a prescription for an albuterol inhaler (Ventolin) to use as needed when you're short of breath, coughing, or wheezing.  Remember to use your Symbicort inhaler by doing 2 puffs twice daily - every day.  Use the spacer with your Symbicort inhaler.   Remember to remove any dentures or partials you have before you use your Symbicort. Remember to brush your teeth & tongue after you use your inhaler as well as rinse, gargle & spit to keep from getting thrush in your mouth or on your tongue (a white film).   Try using nicotine gum or lozenges for your intermittent cigarette cravings.   Contact me if you have any new breathing problems or questions before your next appointment.  TESTS ORDERED: 1. Full pulmonary function testing and follow-up appointment 2. 6 minute walk test on room air before next appointment 3. Serum alpha-1 antitrypsin phenotype today

## 2017-03-11 DIAGNOSIS — I120 Hypertensive chronic kidney disease with stage 5 chronic kidney disease or end stage renal disease: Secondary | ICD-10-CM | POA: Diagnosis not present

## 2017-03-11 DIAGNOSIS — D509 Iron deficiency anemia, unspecified: Secondary | ICD-10-CM | POA: Diagnosis not present

## 2017-03-11 DIAGNOSIS — N2581 Secondary hyperparathyroidism of renal origin: Secondary | ICD-10-CM | POA: Diagnosis not present

## 2017-03-11 DIAGNOSIS — D631 Anemia in chronic kidney disease: Secondary | ICD-10-CM | POA: Diagnosis not present

## 2017-03-11 DIAGNOSIS — Z992 Dependence on renal dialysis: Secondary | ICD-10-CM | POA: Diagnosis not present

## 2017-03-11 DIAGNOSIS — N186 End stage renal disease: Secondary | ICD-10-CM | POA: Diagnosis not present

## 2017-03-14 ENCOUNTER — Encounter (HOSPITAL_COMMUNITY): Payer: Self-pay | Admitting: *Deleted

## 2017-03-14 ENCOUNTER — Emergency Department (HOSPITAL_COMMUNITY)
Admission: EM | Admit: 2017-03-14 | Discharge: 2017-03-14 | Disposition: A | Discharge status: Home / Self Care | Payer: Medicare Other | Attending: Emergency Medicine | Admitting: Emergency Medicine

## 2017-03-14 DIAGNOSIS — D631 Anemia in chronic kidney disease: Secondary | ICD-10-CM | POA: Diagnosis not present

## 2017-03-14 DIAGNOSIS — D509 Iron deficiency anemia, unspecified: Secondary | ICD-10-CM | POA: Diagnosis not present

## 2017-03-14 DIAGNOSIS — R109 Unspecified abdominal pain: Secondary | ICD-10-CM | POA: Insufficient documentation

## 2017-03-14 DIAGNOSIS — N2581 Secondary hyperparathyroidism of renal origin: Secondary | ICD-10-CM | POA: Diagnosis not present

## 2017-03-14 DIAGNOSIS — K5900 Constipation, unspecified: Secondary | ICD-10-CM | POA: Diagnosis not present

## 2017-03-14 DIAGNOSIS — N186 End stage renal disease: Secondary | ICD-10-CM | POA: Diagnosis not present

## 2017-03-14 DIAGNOSIS — R1084 Generalized abdominal pain: Secondary | ICD-10-CM | POA: Diagnosis not present

## 2017-03-14 LAB — COMPREHENSIVE METABOLIC PANEL
ALT: 14 U/L — ABNORMAL LOW (ref 17–63)
AST: 36 U/L (ref 15–41)
Albumin: 4.2 g/dL (ref 3.5–5.0)
Alkaline Phosphatase: 159 U/L — ABNORMAL HIGH (ref 38–126)
Anion gap: 14 (ref 5–15)
BUN: 12 mg/dL (ref 6–20)
CO2: 32 mmol/L (ref 22–32)
Calcium: 8.1 mg/dL — ABNORMAL LOW (ref 8.9–10.3)
Chloride: 85 mmol/L — ABNORMAL LOW (ref 101–111)
Creatinine, Ser: 6.8 mg/dL — ABNORMAL HIGH (ref 0.61–1.24)
GFR calc Af Amer: 10 mL/min — ABNORMAL LOW (ref >60)
GFR calc non Af Amer: 8 mL/min — ABNORMAL LOW (ref >60)
Glucose, Bld: 101 mg/dL — ABNORMAL HIGH (ref 65–99)
Potassium: 3.9 mmol/L (ref 3.5–5.1)
Sodium: 131 mmol/L — ABNORMAL LOW (ref 135–145)
Total Bilirubin: 1.4 mg/dL — ABNORMAL HIGH (ref 0.3–1.2)
Total Protein: 7.8 g/dL (ref 6.5–8.1)

## 2017-03-14 LAB — CBC
HCT: 30.8 % — ABNORMAL LOW (ref 39.0–52.0)
Hemoglobin: 10.5 g/dL — ABNORMAL LOW (ref 13.0–17.0)
MCH: 29.2 pg (ref 26.0–34.0)
MCHC: 34.1 g/dL (ref 30.0–36.0)
MCV: 85.8 fL (ref 78.0–100.0)
Platelets: 64 10*3/uL — ABNORMAL LOW (ref 150–400)
RBC: 3.59 MIL/uL — ABNORMAL LOW (ref 4.22–5.81)
RDW: 17.3 % — ABNORMAL HIGH (ref 11.5–15.5)
WBC: 8.8 10*3/uL (ref 4.0–10.5)

## 2017-03-14 LAB — LIPASE, BLOOD: Lipase: 29 U/L (ref 11–51)

## 2017-03-14 NOTE — ED Triage Notes (Signed)
Pt in c/o abd pain onse today, pt had 3 hrs HD today normally receives 4 hrs, pt c/o SOB at baseline with O2 sats on RA 98%, pt placed on 2 L [place] by EMS d/t pt reporting he is suppose to use it but has not received his tank yet, pt denies  N/v/d, pt c/o constipation, A&O x4

## 2017-03-14 NOTE — ED Notes (Signed)
Pt has been called 4 separate times. No answer. Will d/c from system.

## 2017-03-16 ENCOUNTER — Encounter: Payer: Self-pay | Admitting: Internal Medicine

## 2017-03-16 DIAGNOSIS — D631 Anemia in chronic kidney disease: Secondary | ICD-10-CM | POA: Diagnosis not present

## 2017-03-16 DIAGNOSIS — N186 End stage renal disease: Secondary | ICD-10-CM | POA: Diagnosis not present

## 2017-03-16 DIAGNOSIS — N2581 Secondary hyperparathyroidism of renal origin: Secondary | ICD-10-CM | POA: Diagnosis not present

## 2017-03-16 DIAGNOSIS — D509 Iron deficiency anemia, unspecified: Secondary | ICD-10-CM | POA: Diagnosis not present

## 2017-03-17 LAB — ALPHA-1 ANTITRYPSIN PHENOTYPE: A-1 Antitrypsin: 200 mg/dL — ABNORMAL HIGH (ref 83–199)

## 2017-03-18 DIAGNOSIS — D631 Anemia in chronic kidney disease: Secondary | ICD-10-CM | POA: Diagnosis not present

## 2017-03-18 DIAGNOSIS — N186 End stage renal disease: Secondary | ICD-10-CM | POA: Diagnosis not present

## 2017-03-18 DIAGNOSIS — N2581 Secondary hyperparathyroidism of renal origin: Secondary | ICD-10-CM | POA: Diagnosis not present

## 2017-03-18 DIAGNOSIS — D509 Iron deficiency anemia, unspecified: Secondary | ICD-10-CM | POA: Diagnosis not present

## 2017-03-21 DIAGNOSIS — N2581 Secondary hyperparathyroidism of renal origin: Secondary | ICD-10-CM | POA: Diagnosis not present

## 2017-03-21 DIAGNOSIS — D509 Iron deficiency anemia, unspecified: Secondary | ICD-10-CM | POA: Diagnosis not present

## 2017-03-21 DIAGNOSIS — D631 Anemia in chronic kidney disease: Secondary | ICD-10-CM | POA: Diagnosis not present

## 2017-03-21 DIAGNOSIS — N186 End stage renal disease: Secondary | ICD-10-CM | POA: Diagnosis not present

## 2017-03-23 DIAGNOSIS — D509 Iron deficiency anemia, unspecified: Secondary | ICD-10-CM | POA: Diagnosis not present

## 2017-03-23 DIAGNOSIS — N2581 Secondary hyperparathyroidism of renal origin: Secondary | ICD-10-CM | POA: Diagnosis not present

## 2017-03-23 DIAGNOSIS — D631 Anemia in chronic kidney disease: Secondary | ICD-10-CM | POA: Diagnosis not present

## 2017-03-23 DIAGNOSIS — N186 End stage renal disease: Secondary | ICD-10-CM | POA: Diagnosis not present

## 2017-03-24 ENCOUNTER — Emergency Department (HOSPITAL_COMMUNITY): Payer: Medicare Other

## 2017-03-24 ENCOUNTER — Inpatient Hospital Stay (HOSPITAL_COMMUNITY)
Admission: EM | Admit: 2017-03-24 | Discharge: 2017-03-26 | DRG: 291 | Disposition: A | Discharge status: Home / Self Care | Payer: Medicare Other | Attending: Internal Medicine | Admitting: Internal Medicine

## 2017-03-24 ENCOUNTER — Other Ambulatory Visit: Payer: Self-pay

## 2017-03-24 ENCOUNTER — Encounter (HOSPITAL_COMMUNITY): Payer: Self-pay

## 2017-03-24 DIAGNOSIS — D631 Anemia in chronic kidney disease: Secondary | ICD-10-CM | POA: Diagnosis present

## 2017-03-24 DIAGNOSIS — I472 Ventricular tachycardia, unspecified: Secondary | ICD-10-CM

## 2017-03-24 DIAGNOSIS — F17210 Nicotine dependence, cigarettes, uncomplicated: Secondary | ICD-10-CM | POA: Diagnosis present

## 2017-03-24 DIAGNOSIS — I10 Essential (primary) hypertension: Secondary | ICD-10-CM

## 2017-03-24 DIAGNOSIS — Z955 Presence of coronary angioplasty implant and graft: Secondary | ICD-10-CM

## 2017-03-24 DIAGNOSIS — F1010 Alcohol abuse, uncomplicated: Secondary | ICD-10-CM | POA: Diagnosis not present

## 2017-03-24 DIAGNOSIS — Z79899 Other long term (current) drug therapy: Secondary | ICD-10-CM

## 2017-03-24 DIAGNOSIS — Z9111 Patient's noncompliance with dietary regimen: Secondary | ICD-10-CM

## 2017-03-24 DIAGNOSIS — Z886 Allergy status to analgesic agent status: Secondary | ICD-10-CM

## 2017-03-24 DIAGNOSIS — N186 End stage renal disease: Secondary | ICD-10-CM | POA: Diagnosis not present

## 2017-03-24 DIAGNOSIS — R0603 Acute respiratory distress: Secondary | ICD-10-CM | POA: Diagnosis present

## 2017-03-24 DIAGNOSIS — R0682 Tachypnea, not elsewhere classified: Secondary | ICD-10-CM | POA: Diagnosis not present

## 2017-03-24 DIAGNOSIS — B181 Chronic viral hepatitis B without delta-agent: Secondary | ICD-10-CM | POA: Diagnosis not present

## 2017-03-24 DIAGNOSIS — Z992 Dependence on renal dialysis: Secondary | ICD-10-CM

## 2017-03-24 DIAGNOSIS — K219 Gastro-esophageal reflux disease without esophagitis: Secondary | ICD-10-CM | POA: Diagnosis present

## 2017-03-24 DIAGNOSIS — Z9115 Patient's noncompliance with renal dialysis: Secondary | ICD-10-CM

## 2017-03-24 DIAGNOSIS — I480 Paroxysmal atrial fibrillation: Secondary | ICD-10-CM | POA: Diagnosis present

## 2017-03-24 DIAGNOSIS — R0600 Dyspnea, unspecified: Secondary | ICD-10-CM | POA: Diagnosis not present

## 2017-03-24 DIAGNOSIS — D638 Anemia in other chronic diseases classified elsewhere: Secondary | ICD-10-CM

## 2017-03-24 DIAGNOSIS — Z8249 Family history of ischemic heart disease and other diseases of the circulatory system: Secondary | ICD-10-CM

## 2017-03-24 DIAGNOSIS — J441 Chronic obstructive pulmonary disease with (acute) exacerbation: Secondary | ICD-10-CM

## 2017-03-24 DIAGNOSIS — B182 Chronic viral hepatitis C: Secondary | ICD-10-CM | POA: Diagnosis present

## 2017-03-24 DIAGNOSIS — E8889 Other specified metabolic disorders: Secondary | ICD-10-CM | POA: Diagnosis present

## 2017-03-24 DIAGNOSIS — R0902 Hypoxemia: Secondary | ICD-10-CM | POA: Diagnosis present

## 2017-03-24 DIAGNOSIS — I132 Hypertensive heart and chronic kidney disease with heart failure and with stage 5 chronic kidney disease, or end stage renal disease: Principal | ICD-10-CM | POA: Diagnosis present

## 2017-03-24 DIAGNOSIS — R7989 Other specified abnormal findings of blood chemistry: Secondary | ICD-10-CM

## 2017-03-24 DIAGNOSIS — Z888 Allergy status to other drugs, medicaments and biological substances status: Secondary | ICD-10-CM

## 2017-03-24 DIAGNOSIS — Z86010 Personal history of colonic polyps: Secondary | ICD-10-CM

## 2017-03-24 DIAGNOSIS — R748 Abnormal levels of other serum enzymes: Secondary | ICD-10-CM

## 2017-03-24 DIAGNOSIS — R778 Other specified abnormalities of plasma proteins: Secondary | ICD-10-CM | POA: Diagnosis present

## 2017-03-24 DIAGNOSIS — I4892 Unspecified atrial flutter: Secondary | ICD-10-CM | POA: Diagnosis not present

## 2017-03-24 DIAGNOSIS — D696 Thrombocytopenia, unspecified: Secondary | ICD-10-CM | POA: Diagnosis not present

## 2017-03-24 DIAGNOSIS — I447 Left bundle-branch block, unspecified: Secondary | ICD-10-CM | POA: Diagnosis present

## 2017-03-24 DIAGNOSIS — I5043 Acute on chronic combined systolic (congestive) and diastolic (congestive) heart failure: Secondary | ICD-10-CM | POA: Diagnosis present

## 2017-03-24 DIAGNOSIS — Z7951 Long term (current) use of inhaled steroids: Secondary | ICD-10-CM

## 2017-03-24 DIAGNOSIS — I509 Heart failure, unspecified: Secondary | ICD-10-CM | POA: Diagnosis not present

## 2017-03-24 DIAGNOSIS — D689 Coagulation defect, unspecified: Secondary | ICD-10-CM | POA: Diagnosis present

## 2017-03-24 DIAGNOSIS — I499 Cardiac arrhythmia, unspecified: Secondary | ICD-10-CM

## 2017-03-24 DIAGNOSIS — I2510 Atherosclerotic heart disease of native coronary artery without angina pectoris: Secondary | ICD-10-CM | POA: Diagnosis present

## 2017-03-24 LAB — CBC WITH DIFFERENTIAL/PLATELET
Basophils Absolute: 0 10*3/uL (ref 0.0–0.1)
Basophils Relative: 1 %
Eosinophils Absolute: 0.1 10*3/uL (ref 0.0–0.7)
Eosinophils Relative: 2 %
HCT: 28.9 % — ABNORMAL LOW (ref 39.0–52.0)
Hemoglobin: 9.8 g/dL — ABNORMAL LOW (ref 13.0–17.0)
Lymphocytes Relative: 30 %
Lymphs Abs: 2.1 10*3/uL (ref 0.7–4.0)
MCH: 28.7 pg (ref 26.0–34.0)
MCHC: 33.9 g/dL (ref 30.0–36.0)
MCV: 84.8 fL (ref 78.0–100.0)
Monocytes Absolute: 0.5 10*3/uL (ref 0.1–1.0)
Monocytes Relative: 7 %
Neutro Abs: 4.3 10*3/uL (ref 1.7–7.7)
Neutrophils Relative %: 61 %
Platelets: 80 10*3/uL — ABNORMAL LOW (ref 150–400)
RBC: 3.41 MIL/uL — ABNORMAL LOW (ref 4.22–5.81)
RDW: 16.7 % — ABNORMAL HIGH (ref 11.5–15.5)
WBC: 7.1 10*3/uL (ref 4.0–10.5)

## 2017-03-24 LAB — I-STAT CHEM 8, ED
BUN: 18 mg/dL (ref 6–20)
Calcium, Ion: 0.9 mmol/L — ABNORMAL LOW (ref 1.15–1.40)
Chloride: 88 mmol/L — ABNORMAL LOW (ref 101–111)
Creatinine, Ser: 7 mg/dL — ABNORMAL HIGH (ref 0.61–1.24)
Glucose, Bld: 99 mg/dL (ref 65–99)
HCT: 33 % — ABNORMAL LOW (ref 39.0–52.0)
Hemoglobin: 11.2 g/dL — ABNORMAL LOW (ref 13.0–17.0)
Potassium: 3.9 mmol/L (ref 3.5–5.1)
Sodium: 132 mmol/L — ABNORMAL LOW (ref 135–145)
TCO2: 30 mmol/L (ref 22–32)

## 2017-03-24 LAB — TROPONIN I: Troponin I: 0.09 ng/mL (ref <0.03)

## 2017-03-24 LAB — I-STAT TROPONIN, ED: Troponin i, poc: 0.08 ng/mL (ref 0.00–0.08)

## 2017-03-24 LAB — BRAIN NATRIURETIC PEPTIDE: B Natriuretic Peptide: 1062 pg/mL — ABNORMAL HIGH (ref 0.0–100.0)

## 2017-03-24 MED ORDER — AMIODARONE HCL IN DEXTROSE 360-4.14 MG/200ML-% IV SOLN
INTRAVENOUS | Status: AC
  Filled 2017-03-24: qty 200

## 2017-03-24 MED ORDER — NICOTINE 14 MG/24HR TD PT24
14.0000 mg | MEDICATED_PATCH | Freq: Once | TRANSDERMAL | Status: AC
  Administered 2017-03-25: 14 mg via TRANSDERMAL
  Filled 2017-03-24: qty 1

## 2017-03-24 MED ORDER — IPRATROPIUM-ALBUTEROL 0.5-2.5 (3) MG/3ML IN SOLN
3.0000 mL | Freq: Once | RESPIRATORY_TRACT | Status: AC
  Administered 2017-03-24: 3 mL via RESPIRATORY_TRACT
  Filled 2017-03-24: qty 3

## 2017-03-24 MED ORDER — ETOMIDATE 2 MG/ML IV SOLN
INTRAVENOUS | Status: AC
  Filled 2017-03-24: qty 10

## 2017-03-24 NOTE — ED Notes (Signed)
Shock administered at [facility]

## 2017-03-24 NOTE — ED Triage Notes (Signed)
Arrives EMS from home with complains of resp distress. Pt received 5mg  albuterol pta. PT states he has been sob on and off since this morning. Hx of copd and cardiac hx.  Denies cp, nausea, dizziness  154/88 hr90 afib rr 30 spo2 100% with neb

## 2017-03-24 NOTE — ED Provider Notes (Signed)
[place] DEPT Provider Note   CSN: [id number] Arrival date & time: 03/24/17  1758     History   Chief Complaint Chief Complaint  Patient presents with  . Shortness of Breath    HPI [name] is a 56 y.o. male.  HPI Patient presents with shortness of breath. States he's been having it since he was diagnosed with COPD. Worse last few days. Has had a little bit of a cough. Occasional chest pain. No swelling in his legs. He is a dialysis patient. States he has been taking his medicines. No sick contacts. States he feels like his COPD is worse. Past Medical History:  Diagnosis Date  . Adenomatous colon polyp    tubular  . Anemia   . Anxiety   . Arthritis    left shoulder  . Atherosclerosis of aorta ([place])   . Cardiomegaly   . Chest pain    DATE UNKNOWN, C/O PERIODICALLY  . Chronic kidney disease    dialysis - M/W/F  . Cocaine abuse   . COPD exacerbation ([place]) 08/17/2016  . Dialysis patient ([place])    Monday-Wednesday-Friday  . GERD (gastroesophageal reflux disease)    DATE UNKNOWN  . Hemorrhoids   . Hepatitis B, chronic ([place])   . Hepatitis C   . Hyperkalemia   . Hypertension   . Metabolic bone disease    Patient denies  . Nephrolithiasis   . Pneumonia   . Pulmonary edema   . Renal disorder   . Renal insufficiency   . Shortness of breath dyspnea    " for the last past year with this dialysis"  . Tubular adenoma of colon     Patient Active Problem List   Diagnosis Date Noted  . COPD GOLD II/ still smoking 09/27/2016  . Essential hypertension 09/27/2016  . Fluid overload 08/30/2016  . COPD exacerbation ([place]) 08/17/2016  . Hypertensive urgency 08/17/2016  . Elevated troponin 08/17/2016  . Respiratory failure ([place]) 08/17/2016  . Problem with dialysis access ([place]) 07/23/2016  . End stage renal disease ([place]) 12/02/2015  . Chronic hepatitis B ([place]) 03/05/2014  . Chronic hepatitis C without hepatic coma ([place]) 03/05/2014  . Internal hemorrhoids with  bleeding, swelling and itching 03/05/2014  . Thrombocytopenia ([place]) 03/05/2014  . Chest pain 02/27/2014  . Alcohol abuse 04/14/2009  . Cigarette smoker 04/14/2009  . GANGLION CYST 04/14/2009    Past Surgical History:  Procedure Laterality Date  . AV FISTULA PLACEMENT  2012   BELIEVED WAS PLACED IN JUNE  . COLONOSCOPY    . CORONARY STENT INTERVENTION N/A 02/22/2017   Procedure: CORONARY STENT INTERVENTION;  Surgeon: Nigel Mormon, MD;  Location: [place] [facility];  Service: Cardiovascular;  Laterality: N/A;  . HEMORRHOID BANDING    . LEFT HEART CATH AND CORONARY ANGIOGRAPHY N/A 02/22/2017   Procedure: LEFT HEART CATH AND CORONARY ANGIOGRAPHY;  Surgeon: Nigel Mormon, MD;  Location: [place] [facility];  Service: Cardiovascular;  Laterality: N/A;  . LIGATION OF ARTERIOVENOUS  FISTULA Left 11/13/7320   Procedure: Plication of Left Arm Arteriovenous Fistula;  Surgeon: Elam Dutch, MD;  Location: [place];  Service: Vascular;  Laterality: Left;  . POLYPECTOMY    . REVISON OF ARTERIOVENOUS FISTULA Left 0/25/4270   Procedure: PLICATION OF DISTAL ANEURYSMAL SEGEMENT OF LEFT UPPER ARM ARTERIOVENOUS FISTULA;  Surgeon: Elam Dutch, MD;  Location: [place];  Service: Vascular;  Laterality: Left;  . REVISON OF ARTERIOVENOUS FISTULA Left 01/02/7627   Procedure: Plication of  Left Upper Arm Fistula ;  Surgeon: Waynetta Sandy, MD;  [facility]: [facility] OR;  Service: Vascular;  Laterality: Left;       Home Medications    Prior to Admission medications   Medication Sig Start Date End Date Taking? Authorizing Provider  acetaminophen (TYLENOL) 500 MG tablet Take 500-1,000 mg by mouth every 6 (six) hours as needed (for headaches and pain).    [provider]  albuterol (PROVENTIL HFA;VENTOLIN HFA) 108 (90 Base) MCG/ACT inhaler Inhale 2 puffs into the lungs every 6 (six) hours as needed for wheezing or shortness of breath.     [provider]  albuterol (PROVENTIL  HFA;VENTOLIN HFA) 108 (90 Base) MCG/ACT inhaler Inhale 2 puffs into the lungs every 6 (six) hours as needed for wheezing or shortness of breath. 03/10/17   Javier Glazier, MD  budesonide-formoterol [facility]) 160-4.5 MCG/ACT inhaler Inhale 2 puffs into the lungs 2 (two) times daily.    [provider]  clopidogrel (PLAVIX) 75 MG tablet Take 1 tablet (75 mg total) by mouth daily. 02/24/17   Velvet Bathe, MD  gabapentin (NEURONTIN) 100 MG capsule Take 100 mg by mouth up to two times a day 01/06/17   [provider]  oxyCODONE (OXY IR/ROXICODONE) 5 MG immediate release tablet Take 5 mg by mouth 2 (two) times daily as needed for pain. 02/03/17   [provider]  polyethylene glycol powder (GLYCOLAX/MIRALAX) powder Take 17grams (1 capful) dissolved in at least 8 ounces of water or juice, once daily 01/13/17   Pyrtle, Lajuan Lines, MD  sevelamer carbonate (RENVELA) 800 MG tablet Take 2,400 mg by mouth 3 (three) times daily with meals.    [provider]  Spacer/Aero-Holding Chambers (AEROCHAMBER MV) inhaler Use as instructed 03/10/17   Javier Glazier, MD    Family History Family History  Problem Relation Age of Onset  . Heart disease Mother   . Lung cancer Mother   . Heart disease Father   . Malignant hyperthermia Father   . COPD Father   . Throat cancer Sister   . Esophageal cancer Sister   . Hypertension Other   . COPD Other   . Colon cancer Neg Hx   . Colon polyps Neg Hx   . Rectal cancer Neg Hx   . Stomach cancer Neg Hx     Social History Social History  Substance Use Topics  . Smoking status: Current Every Day Smoker    Packs/day: 0.50    Years: 43.00    Types: Cigarettes    Start date: 08/13/1973  . Smokeless tobacco: Never Used     Comment: Peak rate of 1ppd  . Alcohol use 2.4 oz/week    1 Glasses of wine, 1 Cans of beer, 1 Shots of liquor, 1 Standard drinks or equivalent per week     Comment: occasional      Allergies   Aspirin; Clonidine  derivatives; and Tramadol   Review of Systems Review of Systems  Constitutional: Negative for appetite change and fever.  HENT: Negative for congestion.   Respiratory: Positive for cough and shortness of breath.   Cardiovascular: Positive for chest pain.  Gastrointestinal: Negative for abdominal pain.       Patient states he has had black stools but it is because he drinks coffee.  Genitourinary: Negative for flank pain.  Musculoskeletal: Negative for back pain.  Neurological: Negative for speech difficulty.  Hematological: Negative for adenopathy.  Psychiatric/Behavioral: Negative for agitation.     Physical  Exam Updated Vital Signs BP (!) 149/89   Pulse 99   Temp 97.8 F (36.6 C) (Oral)   Resp (!) 24   Ht 5\' 9"  (1.753 m)   Wt 72.6 kg (160 lb)   SpO2 100%   BMI 23.63 kg/m   Physical Exam  Constitutional: He appears well-developed.  HENT:  Head: Normocephalic.  Eyes: EOM are normal.  Neck: No JVD present.  Cardiovascular: Normal rate.   Pulmonary/Chest: No respiratory distress. He has wheezes.  Mild diffuse wheezes and few rales at the bases.  Abdominal: Soft. There is no tenderness.  Musculoskeletal: He exhibits no edema.  Dialysis access to left upper arm.  Neurological: He is alert.  Skin: Skin is warm. Capillary refill takes less than 2 seconds.     ED Treatments / Results  Labs (all labs ordered are listed, but only abnormal results are displayed) Labs Reviewed  CBC WITH DIFFERENTIAL/PLATELET - Abnormal; Notable for the following:       Result Value   RBC 3.41 (*)    Hemoglobin 9.8 (*)    HCT 28.9 (*)    RDW 16.7 (*)    Platelets 80 (*)    All other components within normal limits  BRAIN NATRIURETIC PEPTIDE - Abnormal; Notable for the following:    B Natriuretic Peptide 1,062.0 (*)    All other components within normal limits  TROPONIN I - Abnormal; Notable for the following:    Troponin I 0.09 (*)    All other components within normal limits    I-STAT CHEM 8, ED - Abnormal; Notable for the following:    Sodium 132 (*)    Chloride 88 (*)    Creatinine, Ser 7.00 (*)    Calcium, Ion 0.90 (*)    Hemoglobin 11.2 (*)    HCT 33.0 (*)    All other components within normal limits  PROTIME-INR  I-STAT TROPONIN, ED    EKG  EKG Interpretation  Date/Time:  Thursday March 24 2017 17:59:01 EDT Ventricular Rate:  72 PR Interval:    QRS Duration: 160 QT Interval:  486 QTC Calculation: 532 R Axis:   -73 Text Interpretation:  Atrial fibrillation Left axis deviation Left bundle branch block Abnormal ECG Confirmed by Davonna Belling [phone]) on 03/24/2017 9:05:35 PM       Radiology Dg Chest 2 View  Result Date: 03/24/2017 CLINICAL DATA:  Dyspnea x1 day.  Current smoker. EXAM: CHEST  2 VIEW COMPARISON:  02/21/2017 FINDINGS: Stable cardiomegaly with aortic atherosclerosis and mild central vascular congestion and interstitial edema consistent mild CHF. Left basilar atelectasis is noted. IMPRESSION: Stable cardiomegaly with mild CHF.  Aortic atherosclerosis. Electronically Signed   By: Ashley Royalty M.D.   On: 03/24/2017 18:18    Procedures .Cardioversion Date/Time: 03/24/2017 11:34 PM Performed by: Davonna Belling Authorized by: Davonna Belling   Consent:    Consent obtained:  Emergent situation   Consent given by:  Patient Pre-procedure details:    Cardioversion basis:  Emergent   Rhythm:  Ventricular tachycardia   Electrode placement:  Anterior-posterior Attempt one:    Cardioversion mode:  Synchronous   Waveform:  Biphasic   Shock (Joules):  200   Shock outcome:  Conversion to normal sinus rhythm Post-procedure details:    Patient status:  Awake .Sedation Date/Time: 03/24/2017 11:35 PM Performed by: Davonna Belling Authorized by: Davonna Belling   Consent:    Consent obtained:  Emergent situation   Consent given by:  Patient   Alternatives discussed:  Analgesia  without sedation Universal protocol:     Immediately prior to procedure a time out was called: no   Indications:    Procedure performed:  Cardioversion   Intended level of sedation:  Moderate (conscious sedation) Pre-sedation assessment:    Time since last food or drink:  Unknown   NPO status caution: urgency dictates proceeding with non-ideal NPO status     ASA classification: class 4 - patient with severe systemic disease that is a constant threat to life     Mallampati score:  Unable to assess   Pre-sedation assessments completed and reviewed: airway patency   Procedure details (see MAR for exact dosages):    Total Provider sedation time (minutes):  10 Post-procedure details:    Attendance: Constant attendance by certified staff until patient recovered     Recovery: Patient returned to pre-procedure baseline     Patient is stable for discharge or admission: yes     Patient tolerance:  Tolerated well, no immediate complications   (including critical care time)  Medications Ordered in ED Medications  etomidate (AMIDATE) 2 MG/ML injection (not administered)  amiodarone (NEXTERONE PREMIX) 360-4.14 MG/200ML-% (1.8 mg/mL) IV infusion (not administered)  nicotine (NICODERM CQ - dosed in mg/24 hours) patch 14 mg (not administered)  ipratropium-albuterol (DUONEB) 0.5-2.5 (3) MG/3ML nebulizer solution 3 mL (3 mLs Nebulization Given 03/24/17 2040)     Initial Impression / Assessment and Plan / ED Course  I have reviewed the triage vital signs and the nursing notes.  Pertinent labs & imaging results that were available during my care of the patient were reviewed by me and considered in my medical decision making (see chart for details).     Patient presented with shortness of breath. Initiated. To be COPD with may be a CHF component. X-ray showed mild CHF but he sounded more tight on auscultation. Patient ambulated and his pulse ox did 100 however after this he went back to bed and a heart rate go up to around 190. It was regular  and wide complex. Atrial flutter with abarency vs ventricular tachycardia. He looked ill and had increasing shortness of breath. Decision was made for emergent cardioversion. Sedated with 10 mg of etomidate cardioverted 120 J with return to sinus rhythm. Discussed with Dr. Einar Gip from cardiology. Admit to internal medicine.  CRITICAL CARE Performed by: Mackie Pai Total critical care time: 30 minutes Critical care time was exclusive of separately billable procedures and treating other patients. Critical care was necessary to treat or prevent imminent or life-threatening deterioration. Critical care was time spent personally by me on the following activities: development of treatment plan with patient and/or surrogate as well as nursing, discussions with consultants, evaluation of patient's response to treatment, examination of patient, obtaining history from patient or surrogate, ordering and performing treatments and interventions, ordering and review of laboratory studies, ordering and review of radiographic studies, pulse oximetry and re-evaluation of patient's condition.    Final Clinical Impressions(s) / ED Diagnoses   Final diagnoses:  COPD exacerbation ([place])  Dyspnea, unspecified type  Ventricular tachycardia [facility])    New Prescriptions New Prescriptions   No medications on file     Davonna Belling, MD 03/24/17 2336

## 2017-03-24 NOTE — ED Notes (Signed)
5mg  etomidate administered

## 2017-03-24 NOTE — ED Notes (Addendum)
Emergent cardioversion  Pt in SVT

## 2017-03-24 NOTE — H&P (Addendum)
History and Physical    CLAXTON LEVITZ ZOX:[id number] DOB: Jul 25, 1960 DOA: 03/24/2017  Referring MD/NP/PA: Dr. Davonna Belling PCP: [name], MD  Patient coming from: Home via EMS  Chief Complaint: Couldn't breathe  HPI: OZ GAMMEL is a 56 y.o. male with medical history significant of  HTN, ESRD on HD MWF, combined systolic and diastolic HF, COPD, anxiety, HCV and Hep B virus, medicine non compliance, tobacco, and ETOH abuse; who presents with complaints of shortness of breath for a few days but was worse acutely tonight. Patient is agitated a poor historian and states that everything's on his chart. He reports that he does not have oxygen at home and needs to have this as he has been trying to use other peoples oxygen. The last hemodialysis session that he had was on Wednesday, but was only 3 hours and normally he dialyzes for 4 hours. Patient reports having a productive cough, wheezes, and continues to smoke. Denies any change in weight or leg swelling. Patient reports not trying anything that relieved or made symptoms worse prior to calling EMS.  ED Course: Patient was given 5 mg of albuterol PTA with EMS. Chest x-ray showing stable cardiomegaly with mild CHF. While in the ED patient was noted to go into suspected  a flutter with aberrancy with heart rates up to 194 for which he was emergently cardiovert after being given 5 mg of Etomidate with 200 J shock, and started on amiodarone drip. Dr.Gangi  of cardiology was consulted and will see patient. Patient was placed on nasal cannula oxygen were cardioversion, but was never documented to be hypoxic. Labs revealed WBC 7.1, hemoglobin 9.8, platelets 80, BNP 1062, troponin 0.09,  Review of Systems  Constitutional: Positive for malaise/fatigue. Negative for chills, fever and weight loss.  HENT: Negative for ear discharge and nosebleeds.   Eyes: Negative for photophobia and pain.  Respiratory: Positive for cough, shortness of breath  and wheezing.   Cardiovascular: Positive for chest pain. Negative for leg swelling.  Gastrointestinal: Negative for abdominal pain, constipation, nausea and vomiting.  Genitourinary: Negative for frequency and urgency.  Musculoskeletal: Negative for falls.  Skin: Negative for itching and rash.  Neurological: Negative for focal weakness and loss of consciousness.  Psychiatric/Behavioral: Positive for substance abuse. Negative for suicidal ideas.    Past Medical History:  Diagnosis Date  . Adenomatous colon polyp    tubular  . Anemia   . Anxiety   . Arthritis    left shoulder  . Atherosclerosis of aorta ([place])   . Cardiomegaly   . Chest pain    DATE UNKNOWN, C/O PERIODICALLY  . Chronic kidney disease    dialysis - M/W/F  . Cocaine abuse   . COPD exacerbation ([place]) 08/17/2016  . Dialysis patient ([place])    Monday-Wednesday-Friday  . GERD (gastroesophageal reflux disease)    DATE UNKNOWN  . Hemorrhoids   . Hepatitis B, chronic ([place])   . Hepatitis C   . Hyperkalemia   . Hypertension   . Metabolic bone disease    Patient denies  . Nephrolithiasis   . Pneumonia   . Pulmonary edema   . Renal disorder   . Renal insufficiency   . Shortness of breath dyspnea    " for the last past year with this dialysis"  . Tubular adenoma of colon     Past Surgical History:  Procedure Laterality Date  . AV FISTULA PLACEMENT  2012   BELIEVED WAS PLACED IN JUNE  . COLONOSCOPY    .  CORONARY STENT INTERVENTION N/A 02/22/2017   Procedure: CORONARY STENT INTERVENTION;  Surgeon: Nigel Mormon, MD;  Location: [facility];  Service: Cardiovascular;  Laterality: N/A;  . HEMORRHOID BANDING    . LEFT HEART CATH AND CORONARY ANGIOGRAPHY N/A 02/22/2017   Procedure: LEFT HEART CATH AND CORONARY ANGIOGRAPHY;  Surgeon: Nigel Mormon, MD;  Location: [facility];  Service: Cardiovascular;  Laterality: N/A;  . LIGATION OF ARTERIOVENOUS  FISTULA Left 0/12/3014   Procedure: Plication of  Left Arm Arteriovenous Fistula;  Surgeon: Elam Dutch, MD;  Location: [facility];  Service: Vascular;  Laterality: Left;  . POLYPECTOMY    . REVISON OF ARTERIOVENOUS FISTULA Left 0/04/9322   Procedure: PLICATION OF DISTAL ANEURYSMAL SEGEMENT OF LEFT UPPER ARM ARTERIOVENOUS FISTULA;  Surgeon: Elam Dutch, MD;  Location: [facility];  Service: Vascular;  Laterality: Left;  . REVISON OF ARTERIOVENOUS FISTULA Left 5/57/3220   Procedure: Plication of Left Upper Arm Fistula ;  Surgeon: Waynetta Sandy, MD;  Location: [facility];  Service: Vascular;  Laterality: Left;     reports that he has been smoking Cigarettes.  He started smoking about 43 years ago. He has a 21.50 pack-year smoking history. He has never used smokeless tobacco. He reports that he drinks about 2.4 oz of alcohol per week . He reports that he uses drugs, including Marijuana and Cocaine.  Allergies  Allergen Reactions  . Aspirin Other (See Comments)    STOMACH PAIN  . Clonidine Derivatives Itching  . Tramadol Itching    Family History  Problem Relation Age of Onset  . Heart disease Mother   . Lung cancer Mother   . Heart disease Father   . Malignant hyperthermia Father   . COPD Father   . Throat cancer Sister   . Esophageal cancer Sister   . Hypertension Other   . COPD Other   . Colon cancer Neg Hx   . Colon polyps Neg Hx   . Rectal cancer Neg Hx   . Stomach cancer Neg Hx     Prior to Admission medications   Medication Sig Start Date End Date Taking? Authorizing Provider  acetaminophen (TYLENOL) 500 MG tablet Take 500-1,000 mg by mouth every 6 (six) hours as needed (for headaches and pain).    [provider]  albuterol (PROVENTIL HFA;VENTOLIN HFA) 108 (90 Base) MCG/ACT inhaler Inhale 2 puffs into the lungs every 6 (six) hours as needed for wheezing or shortness of breath.     [provider]  albuterol (PROVENTIL HFA;VENTOLIN HFA) 108 (90 Base) MCG/ACT inhaler Inhale 2 puffs into the lungs every  6 (six) hours as needed for wheezing or shortness of breath. 03/10/17   Javier Glazier, MD  budesonide-formoterol [facility]) 160-4.5 MCG/ACT inhaler Inhale 2 puffs into the lungs 2 (two) times daily.    [provider]  clopidogrel (PLAVIX) 75 MG tablet Take 1 tablet (75 mg total) by mouth daily. 02/24/17   Velvet Bathe, MD  gabapentin (NEURONTIN) 100 MG capsule Take 100 mg by mouth up to two times a day 01/06/17   [provider]  oxyCODONE (OXY IR/ROXICODONE) 5 MG immediate release tablet Take 5 mg by mouth 2 (two) times daily as needed for pain. 02/03/17   [provider]  polyethylene glycol powder (GLYCOLAX/MIRALAX) powder Take 17grams (1 capful) dissolved in at least 8 ounces of water or juice, once daily 01/13/17   Pyrtle, Lajuan Lines, MD  sevelamer carbonate (RENVELA) 800 MG tablet  Take 2,400 mg by mouth 3 (three) times daily with meals.    [provider]  Spacer/Aero-Holding Chambers (AEROCHAMBER MV) inhaler Use as instructed 03/10/17   Javier Glazier, MD    Physical Exam:  Constitutional: Middle-aged male appears chronically ill, but is resting in no acute distress Vitals:   03/24/17 2238 03/24/17 2239 03/24/17 2240 03/24/17 2245  BP:    (!) 149/89  Pulse: 98 99 (!) 107 99  Resp: 16 19 14  (!) 24  Temp:      TempSrc:      SpO2: 100% 100% 100% 100%  Weight:      Height:       Eyes: PERRL, lids and conjunctivae normal ENMT: Mucous membranes are moist. Posterior pharynx clear of any exudate or lesions. Poor dentition.  Neck: normal, supple, no masses, no thyromegaly Respiratory: Patient mildly tachypneic with rales noted bilaterally. Currently on 2 L of nasal cannula oxygen and able to talk in complete sentences. Cardiovascular: Irregular, no murmurs / rubs / gallops. No extremity edema. 2+ pedal pulses. No carotid bruits.  Abdomen: no tenderness, no masses palpated. No hepatosplenomegaly. Bowel sounds positive.  Musculoskeletal: no clubbing /  cyanosis. No joint deformity upper and lower extremities. Good ROM, no contractures. Normal muscle tone.  Skin: no rashes, lesions, ulcers. No induration Neurologic: CN 2-12 grossly intact. Sensation intact, DTR normal. Strength 5/5 in all 4.  Psychiatric:  Alert and oriented x 3. Agitated mood.     Labs on Admission: I have personally reviewed following labs and imaging studies  CBC:  Recent Labs Lab 03/24/17 1816 03/24/17 1827  WBC 7.1  --   NEUTROABS 4.3  --   HGB 9.8* 11.2*  HCT 28.9* 33.0*  MCV 84.8  --   PLT 80*  --    Basic Metabolic Panel:  Recent Labs Lab 03/24/17 1827  NA 132*  K 3.9  CL 88*  GLUCOSE 99  BUN 18  CREATININE 7.00*   GFR: Estimated Creatinine Clearance: 12.1 mL/min (A) (by C-G formula based on SCr of 7 mg/dL (H)). Liver Function Tests: No results for input(s): AST, ALT, ALKPHOS, BILITOT, PROT, ALBUMIN in the last 168 hours. No results for input(s): LIPASE, AMYLASE in the last 168 hours. No results for input(s): AMMONIA in the last 168 hours. Coagulation Profile: No results for input(s): INR, PROTIME in the last 168 hours. Cardiac Enzymes:  Recent Labs Lab 03/24/17 2050  TROPONINI 0.09*   BNP (last 3 results) No results for input(s): PROBNP in the last 8760 hours. HbA1C: No results for input(s): HGBA1C in the last 72 hours. CBG: No results for input(s): GLUCAP in the last 168 hours. Lipid Profile: No results for input(s): CHOL, HDL, LDLCALC, TRIG, CHOLHDL, LDLDIRECT in the last 72 hours. Thyroid Function Tests: No results for input(s): TSH, T4TOTAL, FREET4, T3FREE, THYROIDAB in the last 72 hours. Anemia Panel: No results for input(s): VITAMINB12, FOLATE, FERRITIN, TIBC, IRON, RETICCTPCT in the last 72 hours. Urine analysis:    Component Value Date/Time   COLORURINE YELLOW 07/16/2011 1619   APPEARANCEUR CLEAR 07/16/2011 1619   LABSPEC 1.018 07/16/2011 1619   PHURINE 7.5 07/16/2011 1619   GLUCOSEU 250 (A) 07/16/2011 1619    HGBUR MODERATE (A) 07/16/2011 1619   BILIRUBINUR SMALL (A) 07/16/2011 1619   KETONESUR NEGATIVE 07/16/2011 1619   PROTEINUR >300 (A) 07/16/2011 1619   UROBILINOGEN 1.0 07/16/2011 1619   NITRITE NEGATIVE 07/16/2011 1619   LEUKOCYTESUR SMALL (A) 07/16/2011 1619   Sepsis Labs: No results  found for this or any previous visit (from the past 240 hour(s)).   Radiological Exams on Admission: Dg Chest 2 View  Result Date: 03/24/2017 CLINICAL DATA:  Dyspnea x1 day.  Current smoker. EXAM: CHEST  2 VIEW COMPARISON:  02/21/2017 FINDINGS: Stable cardiomegaly with aortic atherosclerosis and mild central vascular congestion and interstitial edema consistent mild CHF. Left basilar atelectasis is noted. IMPRESSION: Stable cardiomegaly with mild CHF.  Aortic atherosclerosis. Electronically Signed   By: Ashley Royalty M.D.   On: 03/24/2017 18:18    EKG: Independently reviewed. Suspected Atrial flutter with with rate of 194 seen at bedside.  Assessment/Plan Acute Respiratory distress 2/2  ESRD on HD(M/W/F),  systolic and diastolic CHF, and/or COPD exacerbation, Acute on chronic Patient presents with complaints of acute onset of shortness of breath. Chest x-ray showing mild CHF. BNP 1062 and patient noting having only partial hemodialysis treatment on Wednesday. Creatinine 7 and BUN 18. Patient just recently had echocardiogram in ED/2018 showing EF of 40-45% with diffuse hypokinesis. Patient with bilateral rales respiratory exam. - Admitted to a stepdown bed - Heart failure orders and initiated - Continuous pulse oximetry with nasal cannula oxygen as needed - message left for nephrology to see paitent in the a.m for need of HD - Xopenex nebs q4 hr prn - Budesonide and Brovana Nebs 12 hours  - Follow-up with cardiology - may want notify Dr. Ashok Cordia of pulmonology that the patient's admitted to hospital last seen in his office diagnosed with COPD on 8/30 with alpha-1 antitrypsin lab work noted to be elevated at  200.   Arrhythmia( suspected Aflutter), H/O Afib: Acute. Patient had one episode while in the ED with heart rates noted to be as high as 194. There was reported concern for SVT/atrial flutter. He was cardioverted and placed on a amiodarone drip. Patient has a history of atrial fibrillation previously in the past with Chadsvasc = 3 . He was recommended to be started on Plavix as well as warfarin after his last cardiac cath on 02/22/2017. Patient appears to be significant bleeding risk - Continue amiodarone drip -  Dr. Einar Gip Cardiology consulted, will need to follow-up recommendations.  Elevated troponin: Acute. Initial troponin 0.09 Likely related to above. - Continue to trend troponins - management per cardiology  Coagulopathy: Acute: Patient's INR was greater than 10. Review of records supposed discussions of Plavix and possibly starting the patient on Coumadin. - Recheck INR - Will consider need of vit k administration once INR verified  Essential hypertension - Hydralazine IV prn   Anemia of chronic disease: Chronic. Hemoglobin on admission acutely 9.8. Patient hemoglobin seems to fluctuates between 9 and 11. - Recheck CBC in a.m.  Thrombocytopenia: Chronic. Platelet count 80 on admission. Secondary to history of alcohol abuse and liver disease. - Continue to monitor  Liver disease witth Chronic hep C and B: Patient currently being seen in outpatient by infectious disease and under the care of gastroenterology. - Follow-up outpatient with Dr. Novella Olive and Prytle   H/o Alcohol abuse  - CWIA protocols  Tobacco abuse - Counseled on need a succession of tobacco  DVT prophylaxis: SCD  Code Status: Full Family Communication: No family present at this Disposition Plan: TBD  Consults called: Cardiology  Admission status: Inpatient  Norval Morton MD [facility] Pager [phone]   If 7PM-7AM, please contact night-coverage [email or web address] Password [email or web address]  03/24/2017, 11:20  PM

## 2017-03-24 NOTE — ED Notes (Signed)
Amiodarone drip started

## 2017-03-24 NOTE — ED Notes (Signed)
Pt maintained Sp02 at 98% on RA while ambulating

## 2017-03-25 ENCOUNTER — Other Ambulatory Visit (HOSPITAL_COMMUNITY): Payer: Self-pay

## 2017-03-25 ENCOUNTER — Other Ambulatory Visit: Payer: Self-pay

## 2017-03-25 DIAGNOSIS — Z9111 Patient's noncompliance with dietary regimen: Secondary | ICD-10-CM | POA: Diagnosis not present

## 2017-03-25 DIAGNOSIS — B181 Chronic viral hepatitis B without delta-agent: Secondary | ICD-10-CM | POA: Diagnosis present

## 2017-03-25 DIAGNOSIS — I4891 Unspecified atrial fibrillation: Secondary | ICD-10-CM | POA: Diagnosis not present

## 2017-03-25 DIAGNOSIS — I499 Cardiac arrhythmia, unspecified: Secondary | ICD-10-CM | POA: Diagnosis present

## 2017-03-25 DIAGNOSIS — F1010 Alcohol abuse, uncomplicated: Secondary | ICD-10-CM | POA: Diagnosis present

## 2017-03-25 DIAGNOSIS — I4892 Unspecified atrial flutter: Secondary | ICD-10-CM | POA: Diagnosis present

## 2017-03-25 DIAGNOSIS — N186 End stage renal disease: Secondary | ICD-10-CM | POA: Diagnosis not present

## 2017-03-25 DIAGNOSIS — J9621 Acute and chronic respiratory failure with hypoxia: Secondary | ICD-10-CM | POA: Insufficient documentation

## 2017-03-25 DIAGNOSIS — B182 Chronic viral hepatitis C: Secondary | ICD-10-CM | POA: Diagnosis present

## 2017-03-25 DIAGNOSIS — I132 Hypertensive heart and chronic kidney disease with heart failure and with stage 5 chronic kidney disease, or end stage renal disease: Secondary | ICD-10-CM | POA: Diagnosis present

## 2017-03-25 DIAGNOSIS — I480 Paroxysmal atrial fibrillation: Secondary | ICD-10-CM | POA: Diagnosis present

## 2017-03-25 DIAGNOSIS — I472 Ventricular tachycardia: Secondary | ICD-10-CM | POA: Diagnosis present

## 2017-03-25 DIAGNOSIS — D631 Anemia in chronic kidney disease: Secondary | ICD-10-CM | POA: Diagnosis not present

## 2017-03-25 DIAGNOSIS — I120 Hypertensive chronic kidney disease with stage 5 chronic kidney disease or end stage renal disease: Secondary | ICD-10-CM | POA: Diagnosis not present

## 2017-03-25 DIAGNOSIS — N2581 Secondary hyperparathyroidism of renal origin: Secondary | ICD-10-CM | POA: Diagnosis not present

## 2017-03-25 DIAGNOSIS — I447 Left bundle-branch block, unspecified: Secondary | ICD-10-CM | POA: Diagnosis present

## 2017-03-25 DIAGNOSIS — D689 Coagulation defect, unspecified: Secondary | ICD-10-CM | POA: Diagnosis present

## 2017-03-25 DIAGNOSIS — Z9115 Patient's noncompliance with renal dialysis: Secondary | ICD-10-CM | POA: Diagnosis not present

## 2017-03-25 DIAGNOSIS — R0603 Acute respiratory distress: Secondary | ICD-10-CM | POA: Diagnosis not present

## 2017-03-25 DIAGNOSIS — I2510 Atherosclerotic heart disease of native coronary artery without angina pectoris: Secondary | ICD-10-CM | POA: Diagnosis present

## 2017-03-25 DIAGNOSIS — Z886 Allergy status to analgesic agent status: Secondary | ICD-10-CM | POA: Diagnosis not present

## 2017-03-25 DIAGNOSIS — R0902 Hypoxemia: Secondary | ICD-10-CM | POA: Diagnosis present

## 2017-03-25 DIAGNOSIS — F17210 Nicotine dependence, cigarettes, uncomplicated: Secondary | ICD-10-CM | POA: Diagnosis present

## 2017-03-25 DIAGNOSIS — D696 Thrombocytopenia, unspecified: Secondary | ICD-10-CM | POA: Diagnosis present

## 2017-03-25 DIAGNOSIS — Z955 Presence of coronary angioplasty implant and graft: Secondary | ICD-10-CM | POA: Diagnosis not present

## 2017-03-25 DIAGNOSIS — Z86010 Personal history of colonic polyps: Secondary | ICD-10-CM | POA: Diagnosis not present

## 2017-03-25 DIAGNOSIS — Z992 Dependence on renal dialysis: Secondary | ICD-10-CM | POA: Diagnosis not present

## 2017-03-25 DIAGNOSIS — K219 Gastro-esophageal reflux disease without esophagitis: Secondary | ICD-10-CM | POA: Diagnosis present

## 2017-03-25 DIAGNOSIS — I5043 Acute on chronic combined systolic (congestive) and diastolic (congestive) heart failure: Secondary | ICD-10-CM | POA: Diagnosis not present

## 2017-03-25 DIAGNOSIS — E8889 Other specified metabolic disorders: Secondary | ICD-10-CM | POA: Diagnosis present

## 2017-03-25 LAB — TROPONIN I
Troponin I: 0.12 ng/mL (ref <0.03)
Troponin I: 0.11 ng/mL (ref <0.03)
Troponin I: 0.11 ng/mL (ref <0.03)

## 2017-03-25 LAB — PROTIME-INR
INR: 1.17
INR: >10
Prothrombin Time: 14.8 seconds (ref 11.4–15.2)
Prothrombin Time: >90 seconds — ABNORMAL HIGH (ref 11.4–15.2)

## 2017-03-25 LAB — MRSA PCR SCREENING: MRSA by PCR: NEGATIVE

## 2017-03-25 MED ORDER — CLOPIDOGREL BISULFATE 75 MG PO TABS
75.0000 mg | ORAL_TABLET | Freq: Every day | ORAL | Status: DC
  Administered 2017-03-25 – 2017-03-26 (×2): 75 mg via ORAL
  Filled 2017-03-25 (×2): qty 1

## 2017-03-25 MED ORDER — DEXTROSE 5 % IV SOLN: 5.0000 mg | Freq: Once | INTRAVENOUS | Status: DC

## 2017-03-25 MED ORDER — OXYCODONE HCL 5 MG PO TABS: 5.0000 mg | ORAL_TABLET | Freq: Two times a day (BID) | ORAL | Status: DC | PRN

## 2017-03-25 MED ORDER — HEPARIN SODIUM (PORCINE) 1000 UNIT/ML DIALYSIS: 1000.0000 [IU] | INTRAMUSCULAR | Status: DC | PRN

## 2017-03-25 MED ORDER — VITAMIN B-1 100 MG PO TABS
100.0000 mg | ORAL_TABLET | Freq: Every day | ORAL | Status: DC
  Administered 2017-03-25 – 2017-03-26 (×2): 100 mg via ORAL
  Filled 2017-03-25 (×2): qty 1

## 2017-03-25 MED ORDER — HEPARIN BOLUS VIA INFUSION
3000.0000 [IU] | Freq: Once | INTRAVENOUS | Status: AC
Start: 2017-03-25 — End: 2017-03-25
  Administered 2017-03-25: 3000 [IU] via INTRAVENOUS
  Filled 2017-03-25: qty 3000

## 2017-03-25 MED ORDER — LIDOCAINE-PRILOCAINE 2.5-2.5 % EX CREA: 1.0000 "application " | TOPICAL_CREAM | CUTANEOUS | Status: DC | PRN

## 2017-03-25 MED ORDER — SODIUM CHLORIDE 0.9 % IV SOLN: 250.0000 mL | INTRAVENOUS | Status: DC | PRN

## 2017-03-25 MED ORDER — HEPARIN (PORCINE) IN NACL 100-0.45 UNIT/ML-% IJ SOLN
1000.0000 [IU]/h | INTRAMUSCULAR | Status: DC
  Administered 2017-03-25: 1000 [IU]/h via INTRAVENOUS
  Filled 2017-03-25: qty 250

## 2017-03-25 MED ORDER — FOLIC ACID 1 MG PO TABS
1.0000 mg | ORAL_TABLET | Freq: Every day | ORAL | Status: DC
  Administered 2017-03-25 – 2017-03-26 (×2): 1 mg via ORAL
  Filled 2017-03-25 (×2): qty 1

## 2017-03-25 MED ORDER — AMIODARONE HCL IN DEXTROSE 360-4.14 MG/200ML-% IV SOLN
60.0000 mg/h | INTRAVENOUS | Status: DC
  Administered 2017-03-25: 60 mg/h via INTRAVENOUS
  Filled 2017-03-25: qty 200

## 2017-03-25 MED ORDER — SODIUM CHLORIDE 0.9% FLUSH: 3.0000 mL | INTRAVENOUS | Status: DC | PRN

## 2017-03-25 MED ORDER — BUDESONIDE 0.5 MG/2ML IN SUSP
0.5000 mg | Freq: Two times a day (BID) | RESPIRATORY_TRACT | Status: DC
  Administered 2017-03-25 – 2017-03-26 (×3): 0.5 mg via RESPIRATORY_TRACT
  Filled 2017-03-25 (×3): qty 2

## 2017-03-25 MED ORDER — CALCITRIOL 0.25 MCG PO CAPS: 0.7500 ug | ORAL_CAPSULE | ORAL | Status: DC

## 2017-03-25 MED ORDER — ONDANSETRON HCL 4 MG/2ML IJ SOLN: 4.0000 mg | Freq: Four times a day (QID) | INTRAMUSCULAR | Status: DC | PRN

## 2017-03-25 MED ORDER — THIAMINE HCL 100 MG/ML IJ SOLN: 100.0000 mg | Freq: Every day | INTRAMUSCULAR | Status: DC

## 2017-03-25 MED ORDER — ACETAMINOPHEN 325 MG PO TABS
650.0000 mg | ORAL_TABLET | Freq: Four times a day (QID) | ORAL | Status: DC | PRN
Start: 2017-03-25 — End: 2017-03-26

## 2017-03-25 MED ORDER — LEVALBUTEROL HCL 0.63 MG/3ML IN NEBU: 0.6300 mg | INHALATION_SOLUTION | RESPIRATORY_TRACT | Status: DC | PRN

## 2017-03-25 MED ORDER — SEVELAMER CARBONATE 800 MG PO TABS
2400.0000 mg | ORAL_TABLET | Freq: Three times a day (TID) | ORAL | Status: DC
  Administered 2017-03-25 – 2017-03-26 (×4): 2400 mg via ORAL
  Filled 2017-03-25 (×6): qty 3

## 2017-03-25 MED ORDER — CINACALCET HCL 30 MG PO TABS: 120.0000 mg | ORAL_TABLET | Freq: Once | ORAL | Status: DC

## 2017-03-25 MED ORDER — SODIUM CHLORIDE 0.9 % IV SOLN: 100.0000 mL | INTRAVENOUS | Status: DC | PRN

## 2017-03-25 MED ORDER — LORAZEPAM 2 MG/ML IJ SOLN
1.0000 mg | Freq: Four times a day (QID) | INTRAMUSCULAR | Status: DC | PRN
  Administered 2017-03-25: 1 mg via INTRAVENOUS
  Filled 2017-03-25 (×2): qty 1

## 2017-03-25 MED ORDER — LIDOCAINE HCL (PF) 1 % IJ SOLN: 5.0000 mL | INTRAMUSCULAR | Status: DC | PRN

## 2017-03-25 MED ORDER — ADULT MULTIVITAMIN W/MINERALS CH
1.0000 | ORAL_TABLET | Freq: Every day | ORAL | Status: DC
  Administered 2017-03-25 – 2017-03-26 (×2): 1 via ORAL
  Filled 2017-03-25 (×2): qty 1

## 2017-03-25 MED ORDER — LORAZEPAM 1 MG PO TABS
1.0000 mg | ORAL_TABLET | Freq: Four times a day (QID) | ORAL | Status: DC | PRN
  Administered 2017-03-26: 1 mg via ORAL
  Filled 2017-03-25: qty 1

## 2017-03-25 MED ORDER — AMIODARONE HCL IN DEXTROSE 360-4.14 MG/200ML-% IV SOLN
30.0000 mg/h | INTRAVENOUS | Status: DC
  Administered 2017-03-25 – 2017-03-26 (×3): 30 mg/h via INTRAVENOUS
  Filled 2017-03-25 (×3): qty 200

## 2017-03-25 MED ORDER — ACETAMINOPHEN 500 MG PO TABS: 500.0000 mg | ORAL_TABLET | Freq: Four times a day (QID) | ORAL | Status: DC | PRN

## 2017-03-25 MED ORDER — SODIUM CHLORIDE 0.9% FLUSH
3.0000 mL | Freq: Two times a day (BID) | INTRAVENOUS | Status: DC
  Administered 2017-03-25: 3 mL via INTRAVENOUS

## 2017-03-25 MED ORDER — CINACALCET HCL 30 MG PO TABS: 120.0000 mg | ORAL_TABLET | ORAL | Status: DC

## 2017-03-25 MED ORDER — ARFORMOTEROL TARTRATE 15 MCG/2ML IN NEBU
15.0000 ug | INHALATION_SOLUTION | Freq: Two times a day (BID) | RESPIRATORY_TRACT | Status: DC
Start: 2017-03-25 — End: 2017-03-26
  Administered 2017-03-25 – 2017-03-26 (×3): 15 ug via RESPIRATORY_TRACT
  Filled 2017-03-25 (×3): qty 2

## 2017-03-25 MED ORDER — GABAPENTIN 100 MG PO CAPS
100.0000 mg | ORAL_CAPSULE | Freq: Every day | ORAL | Status: DC
  Administered 2017-03-25 – 2017-03-26 (×2): 100 mg via ORAL
  Filled 2017-03-25 (×2): qty 1

## 2017-03-25 MED ORDER — ALTEPLASE 2 MG IJ SOLR: 2.0000 mg | Freq: Once | INTRAMUSCULAR | Status: DC | PRN

## 2017-03-25 MED ORDER — PENTAFLUOROPROP-TETRAFLUOROETH EX AERO: 1.0000 "application " | INHALATION_SPRAY | CUTANEOUS | Status: DC | PRN

## 2017-03-25 NOTE — Consult Note (Signed)
Reason for Consult:Wide complex tachycardia Referring Physician: Fuller Plan, MD  [name] is an 56 y.o. male.  HPI: [name]  is a 56 y.o. male with long standing hypertensive cardiomyopathy, end-stage renal disease on hemodialysis, chronic hepatitis B and C, COPD, tobacco and marijuana abuse, recent mid RCA PCI for unstable angina, now admitted with palpitations and shortness of breath. In the ED, he underwent 200 J cardioversion for tachycardia in 190s. He continues to be noncompliant with his diet and medications. He is eating chips everyday. He denies any chest pain. First INR check >10, no bleeding. Repeat INR check 1.1  Past Medical History:  Diagnosis Date  . Adenomatous colon polyp    tubular  . Anemia   . Anxiety   . Arthritis    left shoulder  . Atherosclerosis of aorta ([place])   . Cardiomegaly   . Chest pain    DATE UNKNOWN, C/O PERIODICALLY  . Chronic kidney disease    dialysis - M/W/F  . Cocaine abuse   . COPD exacerbation ([place]) 08/17/2016  . Dialysis patient ([place])    Monday-Wednesday-Friday  . GERD (gastroesophageal reflux disease)    DATE UNKNOWN  . Hemorrhoids   . Hepatitis B, chronic ([place])   . Hepatitis C   . Hyperkalemia   . Hypertension   . Metabolic bone disease    Patient denies  . Nephrolithiasis   . Pneumonia   . Pulmonary edema   . Renal disorder   . Renal insufficiency   . Shortness of breath dyspnea    " for the last past year with this dialysis"  . Tubular adenoma of colon     Past Surgical History:  Procedure Laterality Date  . AV FISTULA PLACEMENT  2012   BELIEVED WAS PLACED IN JUNE  . COLONOSCOPY    . CORONARY STENT INTERVENTION N/A 02/22/2017   Procedure: CORONARY STENT INTERVENTION;  Surgeon: Nigel Mormon, MD;  Location: [place] [facility];  Service: Cardiovascular;  Laterality: N/A;  . HEMORRHOID BANDING    . LEFT HEART CATH AND CORONARY ANGIOGRAPHY N/A 02/22/2017   Procedure: LEFT HEART CATH AND CORONARY  ANGIOGRAPHY;  Surgeon: Nigel Mormon, MD;  Location: [place] [facility];  Service: Cardiovascular;  Laterality: N/A;  . LIGATION OF ARTERIOVENOUS  FISTULA Left 4/0/9811   Procedure: Plication of Left Arm Arteriovenous Fistula;  Surgeon: Elam Dutch, MD;  Location: [place];  Service: Vascular;  Laterality: Left;  . POLYPECTOMY    . REVISON OF ARTERIOVENOUS FISTULA Left 03/25/7828   Procedure: PLICATION OF DISTAL ANEURYSMAL SEGEMENT OF LEFT UPPER ARM ARTERIOVENOUS FISTULA;  Surgeon: Elam Dutch, MD;  Location: [place];  Service: Vascular;  Laterality: Left;  . REVISON OF ARTERIOVENOUS FISTULA Left 5/62/1308   Procedure: Plication of Left Upper Arm Fistula ;  Surgeon: Waynetta Sandy, MD;  [facility];  Service: Vascular;  Laterality: Left;    Family History  Problem Relation Age of Onset  . Heart disease Mother   . Lung cancer Mother   . Heart disease Father   . Malignant hyperthermia Father   . COPD Father   . Throat cancer Sister   . Esophageal cancer Sister   . Hypertension Other   . COPD Other   . Colon cancer Neg Hx   . Colon polyps Neg Hx   . Rectal cancer Neg Hx   . Stomach cancer Neg Hx     Social History:  reports that he has  been smoking Cigarettes.  He started smoking about 43 years ago. He has a 21.50 pack-year smoking history. He has never used smokeless tobacco. He reports that he drinks about 2.4 oz of alcohol per week . He reports that he uses drugs, including Marijuana and Cocaine.  Allergies:  Allergies  Allergen Reactions  . Aspirin Other (See Comments)    STOMACH PAIN  . Clonidine Derivatives Itching  . Tramadol Itching    Medications: I have reviewed the patient's current medications.  Results for orders placed or performed during the hospital encounter of 03/24/17 (from the past 48 hour(s))  CBC with Differential     Status: Abnormal   Collection Time: 03/24/17  6:16 PM  Result Value Ref Range   WBC 7.1 4.0 - 10.5 K/uL   RBC  3.41 (L) 4.22 - 5.81 MIL/uL   Hemoglobin 9.8 (L) 13.0 - 17.0 g/dL   HCT 28.9 (L) 39.0 - 52.0 %   MCV 84.8 78.0 - 100.0 fL   MCH 28.7 26.0 - 34.0 pg   MCHC 33.9 30.0 - 36.0 g/dL   RDW 16.7 (H) 11.5 - 15.5 %   Platelets 80 (L) 150 - 400 K/uL    Comment: PLATELET COUNT CONFIRMED BY SMEAR   Neutrophils Relative % 61 %   Neutro Abs 4.3 1.7 - 7.7 K/uL   Lymphocytes Relative 30 %   Lymphs Abs 2.1 0.7 - 4.0 K/uL   Monocytes Relative 7 %   Monocytes Absolute 0.5 0.1 - 1.0 K/uL   Eosinophils Relative 2 %   Eosinophils Absolute 0.1 0.0 - 0.7 K/uL   Basophils Relative 1 %   Basophils Absolute 0.0 0.0 - 0.1 K/uL  I-Stat Troponin, ED (not at [facility])     Status: None   Collection Time: 03/24/17  6:25 PM  Result Value Ref Range   Troponin i, poc 0.08 0.00 - 0.08 ng/mL   Comment 3            Comment: Due to the release kinetics of cTnI, a negative result within the first hours of the onset of symptoms does not rule out myocardial infarction with certainty. If myocardial infarction is still suspected, repeat the test at appropriate intervals.   I-Stat Chem 8, ED     Status: Abnormal   Collection Time: 03/24/17  6:27 PM  Result Value Ref Range   Sodium 132 (L) 135 - 145 mmol/L   Potassium 3.9 3.5 - 5.1 mmol/L   Chloride 88 (L) 101 - 111 mmol/L   BUN 18 6 - 20 mg/dL   Creatinine, Ser 7.00 (H) 0.61 - 1.24 mg/dL   Glucose, Bld 99 65 - 99 mg/dL   Calcium, Ion 0.90 (L) 1.15 - 1.40 mmol/L   TCO2 30 22 - 32 mmol/L   Hemoglobin 11.2 (L) 13.0 - 17.0 g/dL   HCT 33.0 (L) 39.0 - 52.0 %  Brain natriuretic peptide     Status: Abnormal   Collection Time: 03/24/17  8:50 PM  Result Value Ref Range   B Natriuretic Peptide 1,062.0 (H) 0.0 - 100.0 pg/mL  Troponin I     Status: Abnormal   Collection Time: 03/24/17  8:50 PM  Result Value Ref Range   Troponin I 0.09 (HH) <0.03 ng/mL    Comment: CRITICAL RESULT CALLED TO, READ BACK BY AND VERIFIED WITH: C.MARSHALL,RN 2220 03/24/17 M.CAMPBELL   Protime-INR      Status: Abnormal   Collection Time: 03/24/17 11:42 PM  Result Value Ref Range  Prothrombin Time >90.0 (H) 11.4 - 15.2 seconds    Comment: SPECIMEN CHECKED FOR CLOTS REPEATED TO VERIFY    INR >10.00 (HH)     Comment: SPECIMEN CHECKED FOR CLOTS REPEATED TO VERIFY CRITICAL RESULT CALLED TO, READ BACK BY AND VERIFIED WITH: OBAMA P RN AT 0102 ON 09.14.2018 BY COCHRANE S   Troponin I (q 6hr x 3)     Status: Abnormal   Collection Time: 03/25/17  1:04 AM  Result Value Ref Range   Troponin I 0.12 (HH) <0.03 ng/mL    Comment: CRITICAL VALUE NOTED.  VALUE IS CONSISTENT WITH PREVIOUSLY REPORTED AND CALLED VALUE.  Troponin I (q 6hr x 3)     Status: Abnormal   Collection Time: 03/25/17  8:58 AM  Result Value Ref Range   Troponin I 0.11 (HH) <0.03 ng/mL    Comment: CRITICAL VALUE NOTED.  VALUE IS CONSISTENT WITH PREVIOUSLY REPORTED AND CALLED VALUE.  Protime-INR     Status: None   Collection Time: 03/25/17  8:58 AM  Result Value Ref Range   Prothrombin Time 14.8 11.4 - 15.2 seconds   INR 1.17   MRSA PCR Screening     Status: None   Collection Time: 03/25/17  9:03 AM  Result Value Ref Range   MRSA by PCR NEGATIVE NEGATIVE    Comment:        The GeneXpert MRSA Assay (FDA approved for NASAL specimens only), is one component of a comprehensive MRSA colonization surveillance program. It is not intended to diagnose MRSA infection nor to guide or monitor treatment for MRSA infections.   Troponin I (q 6hr x 3)     Status: Abnormal   Collection Time: 03/25/17 12:33 PM  Result Value Ref Range   Troponin I 0.11 (HH) <0.03 ng/mL    Comment: HEMOLYSIS AT THIS LEVEL MAY AFFECT RESULT CRITICAL VALUE NOTED.  VALUE IS CONSISTENT WITH PREVIOUSLY REPORTED AND CALLED VALUE.     Dg Chest 2 View  Result Date: 03/24/2017 CLINICAL DATA:  Dyspnea x1 day.  Current smoker. EXAM: CHEST  2 VIEW COMPARISON:  02/21/2017 FINDINGS: Stable cardiomegaly with aortic atherosclerosis and mild central vascular  congestion and interstitial edema consistent mild CHF. Left basilar atelectasis is noted. IMPRESSION: Stable cardiomegaly with mild CHF.  Aortic atherosclerosis. Electronically Signed   By: Ashley Royalty M.D.   On: 03/24/2017 18:18   EKG 03/25/2017: Sinus rhythm, first degree AV block, IVCD  Review of Systems  Constitutional: Positive for malaise/fatigue.  HENT: Negative.   Eyes: Negative for blurred vision.  Respiratory: Positive for shortness of breath.   Cardiovascular: Positive for palpitations. Negative for chest pain.  Gastrointestinal: Negative.   Genitourinary:       On dialysis   Musculoskeletal: Negative.   Skin: Negative.   Neurological: Negative for loss of consciousness.  Endo/Heme/Allergies: Does not bruise/bleed easily.  Psychiatric/Behavioral: Negative.   All other systems reviewed and are negative.  Blood pressure 140/83, pulse 79, temperature 97.6 F (36.4 C), temperature source Oral, resp. rate 20, height 5\' 9"  (1.753 m), weight 72.6 kg (160 lb), SpO2 100 %. Physical Exam  Nursing note and vitals reviewed. Constitutional: He is oriented to person, place, and time. He appears well-developed and well-nourished.  HENT:  Head: Normocephalic.  Eyes: Conjunctivae are normal.  Neck: JVD present.  Cardiovascular: Normal rate.   Murmur (III/VI holosystolic murmur LLSB, apex) heard. Respiratory: Effort normal and breath sounds normal. He has no wheezes. He has no rales.  GI: Soft. Bowel sounds  are normal.  Musculoskeletal: He exhibits no edema.  Neurological: He is alert and oriented to person, place, and time.  Skin: Skin is dry.  Psychiatric: He has a normal mood and affect.    Assessment: Afib w/wRVR, underlying IVCD. Now in sinus rhyhtm Acute on chronic systolic and diastolic heart failure ESRF om HD Polysubstance abuse Medication and diet noncompliance  Recommendations: Admission with heart failure exacerbation with A. fib RVR with underlying IVCD. Not  ventricular tachycardia. May continue IV amiodarone and switched to by mouth later today to 200 mg bid to maintain sinus rhythm. Will need baseline LFT, PFT, TSH, retina check on follow up. Anticoagulation still necessary given high CHA2DS2VASc score, but concern for compliance. If warfarin started, could stop aspirin and continue plavix and warfarin.   [name] 03/25/2017, 4:54 PM   [place], MD [facility] Pager: [phone] Office: [phone] If no answer Cell [phone]

## 2017-03-25 NOTE — ED Notes (Signed)
Pt awakens.  Pt is alert and oriented, CIWA 0. Pt states that he doesn't drink anymore.  Pt given decaf coffee and graham crackers.  Notified him that breakfast has been ordered and of the plan for today.  Offered pt toothbrush but he declined. Pt denies any sob or pain

## 2017-03-25 NOTE — Consult Note (Addendum)
Reason for Consult: To manage dialysis and dialysis related needs Referring Physician: [name] is an 56 y.o. male.  HPI: Pt is a 56 M with ESRD on HD MWF, Hep B, Hep C, recent PCI to RCA for unstable angina, systolic and diastolic CHF, and h/o Afib/ RVR who is now seen in consultation at the request of Dr. Thereasa Diaz for management of ESRD and provision of HD.  Pt presented to the ED with CP and SOB the evening of 9/13.  HR was in the 190s and he underwent 200J cardioversion with conversion to NSR.  On hep gtt and amio gtt.  Cardiology is following.  Today (Friday) is his dialysis day.  There seems to have been a miscommunication and we weren't contacted about the pt until this evening.    He describes SOB which is made worse with "getting pumped full of fluid".  He is hungry but can't eat due to feeling bloated.  He is noncompliant with dialysis.  Consistently shortens treatment.  Left 1 kg over EDW on 9/12.  Dialyzes at [facility]  EDW 70.5 kg. HD 4 hrs Bath 2K/2 Ca, Dialyzer F180 Heparin 3000 u bolus. Access LUE AVF Sensipar 120 mg q treatment Venofer 50 mg q week (last given 9/12) Mircera 100 mcg q 2 weeks (last given 9/5) Calcitriol 0.75 mcg 3x weekly   Past Medical History:  Diagnosis Date  . Adenomatous colon polyp    tubular  . Anemia   . Anxiety   . Arthritis    left shoulder  . Atherosclerosis of aorta ([place])   . Cardiomegaly   . Chest pain    DATE UNKNOWN, C/O PERIODICALLY  . Chronic kidney disease    dialysis - M/W/F  . Cocaine abuse   . COPD exacerbation ([place]) 08/17/2016  . Dialysis patient ([place])    Monday-Wednesday-Friday  . GERD (gastroesophageal reflux disease)    DATE UNKNOWN  . Hemorrhoids   . Hepatitis B, chronic ([place])   . Hepatitis C   . Hyperkalemia   . Hypertension   . Metabolic bone disease    Patient denies  . Nephrolithiasis   . Pneumonia   . Pulmonary edema   . Renal disorder   . Renal insufficiency   . Shortness of breath  dyspnea    " for the last past year with this dialysis"  . Tubular adenoma of colon     Past Surgical History:  Procedure Laterality Date  . AV FISTULA PLACEMENT  2012   BELIEVED WAS PLACED IN JUNE  . COLONOSCOPY    . CORONARY STENT INTERVENTION N/A 02/22/2017   Procedure: CORONARY STENT INTERVENTION;  Surgeon: Joshua Mormon, MD;  Location: [place] [facility];  Service: Cardiovascular;  Laterality: N/A;  . HEMORRHOID BANDING    . LEFT HEART CATH AND CORONARY ANGIOGRAPHY N/A 02/22/2017   Procedure: LEFT HEART CATH AND CORONARY ANGIOGRAPHY;  Surgeon: Joshua Mormon, MD;  Location: [place] [facility];  Service: Cardiovascular;  Laterality: N/A;  . LIGATION OF ARTERIOVENOUS  FISTULA Left 10/16/4257   Procedure: Plication of Left Arm Arteriovenous Fistula;  Surgeon: Joshua Dutch, MD;  Location: [place];  Service: Vascular;  Laterality: Left;  . POLYPECTOMY    . REVISON OF ARTERIOVENOUS FISTULA Left 5/63/8756   Procedure: PLICATION OF DISTAL ANEURYSMAL SEGEMENT OF LEFT UPPER ARM ARTERIOVENOUS FISTULA;  Surgeon: Joshua Dutch, MD;  Location: [place];  Service: Vascular;  Laterality: Left;  . REVISON OF ARTERIOVENOUS  FISTULA Left 0/03/3817   Procedure: Plication of Left Upper Arm Fistula ;  Surgeon: Joshua Sandy, MD;  Location: [facility] OR;  Service: Vascular;  Laterality: Left;    Family History  Problem Relation Age of Onset  . Heart disease Mother   . Lung cancer Mother   . Heart disease Father   . Malignant hyperthermia Father   . COPD Father   . Throat cancer Sister   . Esophageal cancer Sister   . Hypertension Other   . COPD Other   . Colon cancer Neg Hx   . Colon polyps Neg Hx   . Rectal cancer Neg Hx   . Stomach cancer Neg Hx     Social History:  reports that he has been smoking Cigarettes.  He started smoking about 43 years ago. He has a 21.50 pack-year smoking history. He has never used smokeless tobacco. He reports that he drinks about 2.4 oz of alcohol  per week . He reports that he uses drugs, including Marijuana and Cocaine.  Allergies:  Allergies  Allergen Reactions  . Aspirin Other (See Comments)    STOMACH PAIN  . Clonidine Derivatives Itching  . Tramadol Itching    Medications:  Scheduled: . arformoterol  15 mcg Nebulization BID  . budesonide (PULMICORT) nebulizer solution  0.5 mg Nebulization BID  . [START ON 03/28/2017] calcitRIOL  0.75 mcg Oral Q M,W,F-HD  . clopidogrel  75 mg Oral Daily  . folic acid  1 mg Oral Daily  . gabapentin  100 mg Oral Daily  . multivitamin with minerals  1 tablet Oral Daily  . nicotine  14 mg Transdermal Once  . sevelamer carbonate  2,400 mg Oral TID WC  . sodium chloride flush  3 mL Intravenous Q12H  . thiamine  100 mg Oral Daily   Or  . thiamine  100 mg Intravenous Daily     Results for orders placed or performed during the hospital encounter of 03/24/17 (from the past 48 hour(s))  CBC with Differential     Status: Abnormal   Collection Time: 03/24/17  6:16 PM  Result Value Ref Range   WBC 7.1 4.0 - 10.5 K/uL   RBC 3.41 (L) 4.22 - 5.81 MIL/uL   Hemoglobin 9.8 (L) 13.0 - 17.0 g/dL   HCT 28.9 (L) 39.0 - 52.0 %   MCV 84.8 78.0 - 100.0 fL   MCH 28.7 26.0 - 34.0 pg   MCHC 33.9 30.0 - 36.0 g/dL   RDW 16.7 (H) 11.5 - 15.5 %   Platelets 80 (L) 150 - 400 K/uL    Comment: PLATELET COUNT CONFIRMED BY SMEAR   Neutrophils Relative % 61 %   Neutro Abs 4.3 1.7 - 7.7 K/uL   Lymphocytes Relative 30 %   Lymphs Abs 2.1 0.7 - 4.0 K/uL   Monocytes Relative 7 %   Monocytes Absolute 0.5 0.1 - 1.0 K/uL   Eosinophils Relative 2 %   Eosinophils Absolute 0.1 0.0 - 0.7 K/uL   Basophils Relative 1 %   Basophils Absolute 0.0 0.0 - 0.1 K/uL  I-Stat Troponin, ED (not at [facility])     Status: None   Collection Time: 03/24/17  6:25 PM  Result Value Ref Range   Troponin i, poc 0.08 0.00 - 0.08 ng/mL   Comment 3            Comment: Due to the release kinetics of cTnI, a negative result within the first  hours of the onset of symptoms  does not rule out myocardial infarction with certainty. If myocardial infarction is still suspected, repeat the test at appropriate intervals.   I-Stat Chem 8, ED     Status: Abnormal   Collection Time: 03/24/17  6:27 PM  Result Value Ref Range   Sodium 132 (L) 135 - 145 mmol/L   Potassium 3.9 3.5 - 5.1 mmol/L   Chloride 88 (L) 101 - 111 mmol/L   BUN 18 6 - 20 mg/dL   Creatinine, Ser 7.00 (H) 0.61 - 1.24 mg/dL   Glucose, Bld 99 65 - 99 mg/dL   Calcium, Ion 0.90 (L) 1.15 - 1.40 mmol/L   TCO2 30 22 - 32 mmol/L   Hemoglobin 11.2 (L) 13.0 - 17.0 g/dL   HCT 33.0 (L) 39.0 - 52.0 %  Brain natriuretic peptide     Status: Abnormal   Collection Time: 03/24/17  8:50 PM  Result Value Ref Range   B Natriuretic Peptide 1,062.0 (H) 0.0 - 100.0 pg/mL  Troponin I     Status: Abnormal   Collection Time: 03/24/17  8:50 PM  Result Value Ref Range   Troponin I 0.09 (HH) <0.03 ng/mL    Comment: CRITICAL RESULT CALLED TO, READ BACK BY AND VERIFIED WITH: C.MARSHALL,RN 2220 03/24/17 M.CAMPBELL   Protime-INR     Status: Abnormal   Collection Time: 03/24/17 11:42 PM  Result Value Ref Range   Prothrombin Time >90.0 (H) 11.4 - 15.2 seconds    Comment: SPECIMEN CHECKED FOR CLOTS REPEATED TO VERIFY    INR >10.00 (HH)     Comment: SPECIMEN CHECKED FOR CLOTS REPEATED TO VERIFY CRITICAL RESULT CALLED TO, READ BACK BY AND VERIFIED WITH: OBAMA P RN AT 0102 ON 09.14.2018 BY COCHRANE S   Troponin I (q 6hr x 3)     Status: Abnormal   Collection Time: 03/25/17  1:04 AM  Result Value Ref Range   Troponin I 0.12 (HH) <0.03 ng/mL    Comment: CRITICAL VALUE NOTED.  VALUE IS CONSISTENT WITH PREVIOUSLY REPORTED AND CALLED VALUE.  Troponin I (q 6hr x 3)     Status: Abnormal   Collection Time: 03/25/17  8:58 AM  Result Value Ref Range   Troponin I 0.11 (HH) <0.03 ng/mL    Comment: CRITICAL VALUE NOTED.  VALUE IS CONSISTENT WITH PREVIOUSLY REPORTED AND CALLED VALUE.  Protime-INR      Status: None   Collection Time: 03/25/17  8:58 AM  Result Value Ref Range   Prothrombin Time 14.8 11.4 - 15.2 seconds   INR 1.17   MRSA PCR Screening     Status: None   Collection Time: 03/25/17  9:03 AM  Result Value Ref Range   MRSA by PCR NEGATIVE NEGATIVE    Comment:        The GeneXpert MRSA Assay (FDA approved for NASAL specimens only), is one component of a comprehensive MRSA colonization surveillance program. It is not intended to diagnose MRSA infection nor to guide or monitor treatment for MRSA infections.   Troponin I (q 6hr x 3)     Status: Abnormal   Collection Time: 03/25/17 12:33 PM  Result Value Ref Range   Troponin I 0.11 (HH) <0.03 ng/mL    Comment: HEMOLYSIS AT THIS LEVEL MAY AFFECT RESULT CRITICAL VALUE NOTED.  VALUE IS CONSISTENT WITH PREVIOUSLY REPORTED AND CALLED VALUE.     Dg Chest 2 View  Result Date: 03/24/2017 CLINICAL DATA:  Dyspnea x1 day.  Current smoker. EXAM: CHEST  2 VIEW COMPARISON:  02/21/2017  FINDINGS: Stable cardiomegaly with aortic atherosclerosis and mild central vascular congestion and interstitial edema consistent mild CHF. Left basilar atelectasis is noted. IMPRESSION: Stable cardiomegaly with mild CHF.  Aortic atherosclerosis. Electronically Signed   By: Ashley Royalty M.D.   On: 03/24/2017 18:18    ROS: all other systems reviewed and are negative except as per HPI  Blood pressure (!) 137/92, pulse 80, temperature (!) 97.5 F (36.4 C), temperature source Oral, resp. rate (!) 21, height 5\' 9"  (1.753 m), weight 72.6 kg (160 lb), SpO2 100 %. .  GEN NAD, lying in bed HEENT EOMI, PERRL, facial fullness NECK JVD to angle of mandible PULM slightly increased WOB with some bilateral crackles CV RRR + S3 ABD mildly distended EXT no LE edema NEURO AAO x 3 SKIN no rashes  Assessment/Plan: 1 Paroxysmal Afib/ Aflutter with LBBB: s/p cardioversion in ED, appears to have helped SOB significantly.  He has historically not been compliant with  anticoagulation so I feel not an appropriate candidate for coumadin.  ? If even he was adherent to plavix.  Allergy to ASA.  On amio load. 2. Acute systolic and diastolic CHF exac: EF 82-95%.  UF as able. 3 ESRD: noncompliant.  Will provide HD tonight and likely will have to do again tomorrow for vol removal.  2K bath.  May need new EDW, currently 70.5 kg 4 Hypertension: listed on OP meds from HD center is toprol XL 25 mg daily, amlodipine 10 mg daily, and minoxidil 5 mg daily. BP s are generally uncontrolled in HD. 5. Anemia of ESRD: Last OP Hgb 10.5 on 03/23/17.  ESA not due yet 6. Metabolic Bone Disease: Last OP PTH 1043.  ON sensipar 120 q treatment, Phos 5.3, on Renvela 2400 TID AC and 1600 with snacks. Calcitriol 0.75 mcg TIW.   7.  Nutrition: Last alb 4.2 8/22.   8.  CAD s/p PCI to RCA 02/2017: was supposed to be on 6 mo of Plavix. 9.  INR: was initially > 10 but on recheck with no intervention WNL. Likely error during the first assay.  [name] 03/25/2017, 8:57 PM

## 2017-03-25 NOTE — Progress Notes (Signed)
Patient seen by me earlier today. Admission with heart failure exacerbation with A. fib RVR with underlying IVCD. Not ventricular tachycardia. INR elevated. Noncompliant patient with no recent follow-up after his hospitalization. My concern are regarding compliance with warfarin and maintaining supratherapeutic INR. Consider vitamin K for reversal. Fortunately no bleeding. Needs dialysis today. May continue IV amiodarone and switched to by mouth later today.  Full consult note to follow.  Nigel Mormon, MD [facility] [place] Pager: [phone] Office: [phone] If no answer Cell [phone]

## 2017-03-25 NOTE — ED Notes (Signed)
Cardiology was here to see pt and requests that we check INR

## 2017-03-25 NOTE — ED Notes (Signed)
RT at bedside.

## 2017-03-25 NOTE — Procedures (Signed)
Patient seen and examined on Hemodialysis. QB 450 mL/ min, UF goal 4L  Tolerating well.  Treatment adjusted as needed.  Madelon Lips MD [facility] pgr [phone] 11:49 PM

## 2017-03-25 NOTE — Progress Notes (Signed)
Patient asking for Dr. Posey Pronto (renal), says he cannot go the night without having dialysis. Paged MD.

## 2017-03-25 NOTE — ED Notes (Signed)
Pt finished his breakfast tray, ambulated to bathroom with steady gate and had 1 BM

## 2017-03-25 NOTE — Progress Notes (Signed)
ANTICOAGULATION CONSULT NOTE - Initial Consult  Pharmacy Consult for IV heparin  Indication: atrial fibrillation  Allergies  Allergen Reactions  . Aspirin Other (See Comments)    STOMACH PAIN  . Clonidine Derivatives Itching  . Tramadol Itching    Patient Measurements: Height: 5\' 9"  (175.3 cm) Weight: 160 lb (72.6 kg) IBW/kg (Calculated) : 70.7 Heparin Dosing Weight: 72.6 kg  Vital Signs: Temp: 97.4 F (36.3 C) (09/14 0838) Temp Source: Oral (09/14 0838) BP: 157/87 (09/14 0838) Pulse Rate: 82 (09/14 0838)  Labs:  Recent Labs  03/24/17 1816 03/24/17 1827 03/24/17 2050 03/24/17 2342 03/25/17 0104 03/25/17 0858  HGB 9.8* 11.2*  --   --   --   --   HCT 28.9* 33.0*  --   --   --   --   PLT 80*  --   --   --   --   --   LABPROT  --   --   --  >90.0*  --  14.8  INR  --   --   --  >10.00*  --  1.17  CREATININE  --  7.00*  --   --   --   --   TROPONINI  --   --  0.09*  --  0.12* 0.11*    Estimated Creatinine Clearance: 12.1 mL/min (A) (by C-G formula based on SCr of 7 mg/dL (H)).   Medical History: Past Medical History:  Diagnosis Date  . Adenomatous colon polyp    tubular  . Anemia   . Anxiety   . Arthritis    left shoulder  . Atherosclerosis of aorta ([place])   . Cardiomegaly   . Chest pain    DATE UNKNOWN, C/O PERIODICALLY  . Chronic kidney disease    dialysis - M/W/F  . Cocaine abuse   . COPD exacerbation ([place]) 08/17/2016  . Dialysis patient ([place])    Monday-Wednesday-Friday  . GERD (gastroesophageal reflux disease)    DATE UNKNOWN  . Hemorrhoids   . Hepatitis B, chronic ([place])   . Hepatitis C   . Hyperkalemia   . Hypertension   . Metabolic bone disease    Patient denies  . Nephrolithiasis   . Pneumonia   . Pulmonary edema   . Renal disorder   . Renal insufficiency   . Shortness of breath dyspnea    " for the last past year with this dialysis"  . Tubular adenoma of colon     Medications:  Infusions:  . sodium chloride    . amiodarone 30  mg/hr (03/25/17 0755)  . heparin      Assessment: 56 yo male with hx afib, but apparently not on warfarin prior to admission, although was prescribed at last discharge 8/18.  Initial INR repeated as > 10.  Patient cardioverted for afib w/ RVR last night.  Repeat INR this morning 1.17.  Spoke to Dr. Virgina Jock with cardiology who asked me to discuss anticoagulation with internal medicine.  Spoke to Dr. Thereasa Solo, will start IV heparin.  Will decide about adding Coumadin later today.  No bleeding or complications currently.  RN working on 2nd IV site  Goal of Therapy:  Heparin level 0.3-0.7 units/ml Monitor platelets by anticoagulation protocol: Yes   Plan:  1. Start IV heparin with bolus of 3000 units/hr. 2. Then start heparin gtt at 1000 units/hr. 3. Daily heparin level and CBC. 4. F/u plans for Coumadin.  Uvaldo Rising, BCPS  Clinical Pharmacist Pager [phone]  03/25/2017  10:42 AM

## 2017-03-25 NOTE — Progress Notes (Signed)
[place] TEAM 1 - Stepdown/ICU TEAM  [name]  WCH:[id number] DOB: September 27, 1960 DOA: 03/24/2017 PCP: [name], MD    Brief Narrative:  56 y.o. male with a history of HTN, ESRD on HD MWF, combined systolic and diastolic CHF, COPD, anxiety, HCV and Hep B, medicine non compliance, and tobacco and ETOH abuse who presented with SOB. He had not been compliant w/ his full outpt HD tx.    While in the ED the patient was noted to go into atrial flutter with aberrancy with heart rates up to 194 for which he was emergently cardioverted and started on an amiodarone drip.  Subjective: The pt reports that he does not feel any better than when he came in.  Despite that, he does not appear to be in acute distress.  He denies SSCP, n/v, or abdom pain.    Assessment & Plan:  Acute hypoxic Respiratory distress due to pulmonary edema A result of noncompliance w/ HD in the setting of ESRD and systolic and diastolic CHF EF 35-36% with diffuse hypokinesis - control of HR appears to have significantly improved this issue - wean off O2 as able    Suspected Aflutter - Parox Afib w/ LBBB Underwent cardioversion in ED 9/13 - Chadsvasc is 3 - was placed on warfarin in August but appears to have been noncompliant w/ taking and f/u in warfarin clinic - do not feel he is an appropriate candidate for long term outpt anticoag due to his compliance issues - in that pt was just cardioverted, however, will cont anticoag while he is an inpatient   Elevated troponin Felt to be due to tachycardia/cardioversion - trajectory has flattened at peak of 0.12 w/ no chest pain   Coagulopathy - erroneous Appears this was lab error - pt claims he was told by his "kidney doctor" to stop taking warfarin   Alpha  -1 antitrypsin deficiency tested as an outpt by Pulmonary - due to f/u w/ Dr. Ashok Cordia at the office   ESRD on M/W/F HD Nephrology reportedly consulted at time of admit, but they have not yet seen pt - suspect  there was a miscommunication - no acute requirement for HD presently - will contact Nephrology in the AM - check BMET now as a precaution   CAD 90% prox RCA stenosis per cath s/p stent placement 02/22/17 - was to complete 6 moths of Plavix - reportedly allergic to ASA - no SSCP at this time   Severe calcific aortic and mitral valve  Essential hypertension BP not at goal presently - will not adjust meds as BP likely to fall w/ HD   Anemia of chronic kidney disease Baseline Hgb 9-11 - stable at present   Chronic Thrombocytopenia Due to alcohol abuse and liver disease  Chronic Hepatitis C and B Followed by ID and GI as outpt   Alcohol abuse  CWIA protocols - no evidence of withdrawal at this time   Tobacco abuse   DVT prophylaxis: IV heparin  Code Status: FULL CODE Family Communication: no family present at time of exam  Disposition Plan:   Consultants:  Cardiology - [facility]   Procedures: 9/13 DCCV in ED   Antimicrobials:  none  Objective: Blood pressure (!) 149/71, pulse 78, temperature 97.7 F (36.5 C), temperature source Oral, resp. rate 20, height 5\' 9"  (1.753 m), weight 72.6 kg (160 lb), SpO2 100 %.  Intake/Output Summary (Last 24 hours) at 03/25/17 1153 Last data filed at 03/25/17 0921  Gross per  24 hour  Intake            690.9 ml  Output                0 ml  Net            690.9 ml   Filed Weights   03/24/17 1804  Weight: 72.6 kg (160 lb)    Examination: General: No acute respiratory distress Lungs: Clear to auscultation bilaterally without wheezes or crackles Cardiovascular: Regular rate and rhythm without murmur gallop or rub normal S1 and S2 Abdomen: Nontender, nondistended, soft, bowel sounds positive, no rebound, no ascites, no appreciable mass Extremities: No significant cyanosis, clubbing, or edema bilateral lower extremities  CBC:  Recent Labs Lab 03/24/17 1816 03/24/17 1827  WBC 7.1  --   NEUTROABS 4.3  --   HGB 9.8*  11.2*  HCT 28.9* 33.0*  MCV 84.8  --   PLT 80*  --    Basic Metabolic Panel:  Recent Labs Lab 03/24/17 1827  NA 132*  K 3.9  CL 88*  GLUCOSE 99  BUN 18  CREATININE 7.00*   GFR: Estimated Creatinine Clearance: 12.1 mL/min (A) (by C-G formula based on SCr of 7 mg/dL (H)).  Liver Function Tests: No results for input(s): AST, ALT, ALKPHOS, BILITOT, PROT, ALBUMIN in the last 168 hours. No results for input(s): LIPASE, AMYLASE in the last 168 hours. No results for input(s): AMMONIA in the last 168 hours.  Coagulation Profile:  Recent Labs Lab 03/24/17 2342 03/25/17 0858  INR >10.00* 1.17    Cardiac Enzymes:  Recent Labs Lab 03/24/17 2050 03/25/17 0104 03/25/17 0858  TROPONINI 0.09* 0.12* 0.11*    HbA1C: Hgb A1c MFr Bld  Date/Time Value Ref Range Status  08/17/2016 01:31 AM 4.9 4.8 - 5.6 % Final    Comment:    (NOTE)         Pre-diabetes: 5.7 - 6.4         Diabetes: >6.4         Glycemic control for adults with diabetes: <7.0      Recent Results (from the past 240 hour(s))  MRSA PCR Screening     Status: None   Collection Time: 03/25/17  9:03 AM  Result Value Ref Range Status   MRSA by PCR NEGATIVE NEGATIVE Final    Comment:        The GeneXpert MRSA Assay (FDA approved for NASAL specimens only), is one component of a comprehensive MRSA colonization surveillance program. It is not intended to diagnose MRSA infection nor to guide or monitor treatment for MRSA infections.      Scheduled Meds: . arformoterol  15 mcg Nebulization BID  . budesonide (PULMICORT) nebulizer solution  0.5 mg Nebulization BID  . clopidogrel  75 mg Oral Daily  . folic acid  1 mg Oral Daily  . gabapentin  100 mg Oral Daily  . multivitamin with minerals  1 tablet Oral Daily  . nicotine  14 mg Transdermal Once  . sevelamer carbonate  2,400 mg Oral TID WC  . sodium chloride flush  3 mL Intravenous Q12H  . thiamine  100 mg Oral Daily   Or  . thiamine  100 mg Intravenous  Daily     LOS: 0 days   Cherene Altes, MD [facility] Office  [phone] Pager - Text Page per Shea Evans as per below:  On-Call/Text Page:      [email or web address]      password [facility]  If 7PM-7AM, please contact night-coverage [email or web address] Password [facility] 03/25/2017, 11:53 AM

## 2017-03-25 NOTE — ED Notes (Signed)
Attempted report 

## 2017-03-26 LAB — HEPARIN LEVEL (UNFRACTIONATED): Heparin Unfractionated: 0.11 IU/mL — ABNORMAL LOW (ref 0.30–0.70)

## 2017-03-26 MED ORDER — "THROMBI-PAD 3""X3"" EX PADS"
1.0000 | MEDICATED_PAD | CUTANEOUS | Status: DC
  Filled 2017-03-26: qty 1

## 2017-03-26 MED ORDER — NICOTINE 21 MG/24HR TD PT24
21.0000 mg | MEDICATED_PATCH | Freq: Every day | TRANSDERMAL | Status: DC
  Administered 2017-03-26: 21 mg via TRANSDERMAL
  Filled 2017-03-26: qty 1

## 2017-03-26 MED ORDER — CALCITRIOL 0.25 MCG PO CAPS: 0.7500 ug | ORAL_CAPSULE | ORAL | Status: DC

## 2017-03-26 MED ORDER — SEVELAMER CARBONATE 800 MG PO TABS: 2400.0000 mg | ORAL_TABLET | Freq: Three times a day (TID) | ORAL | Status: DC

## 2017-03-26 MED ORDER — HEPARIN (PORCINE) IN NACL 100-0.45 UNIT/ML-% IJ SOLN
1200.0000 [IU]/h | INTRAMUSCULAR | Status: DC
  Administered 2017-03-26: 1200 [IU]/h via INTRAVENOUS
  Filled 2017-03-26 (×3): qty 250

## 2017-03-26 MED ORDER — CINACALCET HCL 30 MG PO TABS: 120.0000 mg | ORAL_TABLET | ORAL | Status: DC

## 2017-03-26 NOTE — Discharge Summary (Signed)
DISCHARGE SUMMARY  [name]  MR#: [id number]  DOB:[birth year]  Date of Admission: 03/24/2017 Date of Discharge: 03/26/2017  Attending Physician:[name]  Patient's NGE:[id number], Joshua Beard, MD  Consults: Cardiology  Nephrology   Disposition: D/C home at insistence of patient, even after pt warned of consequences to include Afib/flutter induced CVA  Follow-up Appts: Follow-up Information    Joshua Mormon, MD Follow up on 04/07/2017.   Specialty:  Cardiology Why:  08:30 AM Contact information: [place] [address] [phone]        Joshua Mccreedy, MD Follow up in 3 day(s).   Specialty:  Internal Medicine Contact information: [address] [place] [address] [phone]           Tests Needing Follow-up: -assess HR and rhythm -assess compliance w/ medication tx and HD  Discharge Diagnoses: Acute hypoxic Respiratory distress due to pulmonary edema Suspected Aflutter - Parox Afib w/ LBBB and RVR  Elevated troponin Alpha  -1 antitrypsin deficiency ESRD on M/W/F HD CAD Severe calcific aortic and mitral valve Essential hypertension Anemia of chronic kidney disease Chronic Thrombocytopenia Chronic Hepatitis C and B Alcohol abuse  Tobacco abuse  Initial presentation: 56 y.o.malewith a history of HTN, ESRD on HD MWF, combined systolic and diastolic CHF, COPD, anxiety, HCV and Hep B, medicine non compliance, and tobacco and ETOH abuse who presented with SOB. He had not been compliant w/ his full outpt HD tx.    While inthe ED the patient was noted to go into atrial flutter with aberrancy withheart rates up to 140for which he was emergently cardioverted and started on an amiodarone drip.  Hospital Course: On the morning of 03/26/17 the patient voiced his dissatisfaction that he was on a heparin drip as well as IV amiodarone.  He demanded that his IVs be removed.  He was informed of their  purpose and the fact that heparin was being used to decrease his risk of having a stroke related to his recent cardioversion.  He voiced understanding of this reasoning but continued to demand that he wanted all IVs removed and to be off those medications.  It was explained to him that Nephrology wished to perform another dialysis treatment today.  He refused this stating that he was going home.  Again the patient was warned about the consequences of leaving at this time to include the possibility of an A. fib/flutter induced stroke, recurrent severe respiratory distress, uncontrolled heart rate/RVR, and other potential dangers.  He continued to insist that he was going home regardless.  He promised to keep his scheduled dialysis treatment on Monday, September 17.  Acute hypoxic Respiratory distress due to pulmonary edema A result of noncompliance w/ HD in the setting of ESRD and systolic and diastolic CHF - EF 51-88% with diffuse hypokinesis - control of HR appears to have significantly improved this issue   Suspected Aflutter - Parox Afib w/ LBBB Underwent cardioversion in ED 9/13 - Chadsvasc is 3 - was placed on warfarin in August but appears to have been noncompliant w/ taking and f/u in warfarin clinic - do not feel he is an appropriate candidate for long term outpt anticoag due to his compliance issues - in that pt was just cardioverted, however, the plan was to cont anticoag while he is was an inpatient - at his insistence heparin is being stopped this morning  Elevated troponin Felt to be due to tachycardia/cardioversion - trajectory has flattened at peak of  0.12 w/ no chest pain   Coagulopathy - erroneous Appears this was lab error - pt claims he was told by his "kidney doctor" to stop taking warfarin - regardless he is not felt to be a candidate for anticoag due to his habitual noncompliance and refusal to accept education on the importance of anticoag   Alpha  -1 antitrypsin  deficiency tested as an outpt by Pulmonary - due to f/u w/ [name] at the office   ESRD on M/W/F HD Nephrology reportedly consulted at time of admit, but they had not seen the pt by 5:30PM - unclear where communication lapse occurred - Nephrology contacted and promptly visited the pt 9/14 PM, w/ arrangements made for HD that night - plan was for a second HD tx on 9/15, but the pt refused, and insisted on being d/c - he also refused labs    CAD 90% prox RCA stenosis per cath s/p stent placement 02/22/17 - was to complete 6 moths of Plavix - reportedly allergic to ASA - no SSCP at this time   Severe calcific aortic and mitral valve  Essential hypertension BP not at goal presently - resume prior meds after d/c   Anemia of chronic kidney disease Baseline Hgb 9-11 - stable at present   Chronic Thrombocytopenia Due to alcohol abuse and liver disease  Chronic Hepatitis C and B Followed by ID and GI as outpt   Alcohol abuse  No evidence of withdrawal during hospital stay - pt was agitated and noncompliant, but remained fully alert and oriented  Tobacco abuse  Allergies as of 03/26/2017      Reactions   Aspirin Other (See Comments)   STOMACH PAIN   Clonidine Derivatives Itching   Tramadol Itching      Medication List    STOP taking these medications   warfarin 5 MG tablet Commonly known as:  COUMADIN     TAKE these medications   acetaminophen 500 MG tablet Commonly known as:  TYLENOL Take 500-1,000 mg by mouth every 6 (six) hours as needed (for headaches and pain).   AEROCHAMBER MV inhaler Use as instructed   albuterol 108 (90 Base) MCG/ACT inhaler Commonly known as:  PROVENTIL HFA;VENTOLIN HFA Inhale 2 puffs into the lungs every 6 (six) hours as needed for wheezing or shortness of breath.   budesonide-formoterol 160-4.5 MCG/ACT inhaler Commonly known as:  SYMBICORT Inhale 2 puffs into the lungs 2 (two) times daily.   calcitRIOL 0.25 MCG capsule Commonly  known as:  ROCALTROL Take 3 capsules (0.75 mcg total) by mouth every Monday, Wednesday, and Friday with hemodialysis.   cinacalcet 30 MG tablet Commonly known as:  SENSIPAR Take 4 tablets (120 mg total) by mouth every Monday, Wednesday, and Friday with hemodialysis.   clopidogrel 75 MG tablet Commonly known as:  PLAVIX Take 1 tablet (75 mg total) by mouth daily.   gabapentin 100 MG capsule Commonly known as:  NEURONTIN Take 100 mg by mouth up to two times a day   metoprolol succinate 25 MG 24 hr tablet Commonly known as:  TOPROL-XL Take 25 mg by mouth daily.   minoxidil 10 MG tablet Commonly known as:  LONITEN Take 10 mg by mouth 2 (two) times daily.   oxyCODONE 5 MG immediate release tablet Commonly known as:  Oxy IR/ROXICODONE Take 5 mg by mouth 2 (two) times daily as needed for pain.   polyethylene glycol powder powder Commonly known as:  GLYCOLAX/MIRALAX Take 17grams (1 capful) dissolved in at least  8 ounces of water or juice, once daily   sevelamer carbonate 800 MG tablet Commonly known as:  RENVELA Take 3 tablets (2,400 mg total) by mouth 3 (three) times daily with meals.            Discharge Care Instructions        Start     Ordered   03/28/17 0000  calcitRIOL (ROCALTROL) 0.25 MCG capsule  Every M-W-F (Hemodialysis)     03/26/17 1017   03/28/17 0000  cinacalcet (SENSIPAR) 30 MG tablet  Every M-W-F (Hemodialysis)     03/26/17 1017   03/26/17 0000  sevelamer carbonate (RENVELA) 800 MG tablet  3 times daily with meals     03/26/17 1017      Day of Discharge BP 101/76 (BP Location: Right Arm)   Pulse 88   Temp 98.1 F (36.7 C) (Oral)   Resp 14   Ht 5\' 9"  (1.753 m)   Wt 72 kg (158 lb 11.7 oz)   SpO2 91%   BMI 23.44 kg/m   Physical Exam: General: No acute respiratory distress Lungs: Clear to auscultation bilaterally without wheezes or crackles Cardiovascular: Regular rate and rhythm without murmur  Abdomen: Nontender, nondistended, soft, bowel  sounds positive, no rebound, no ascites, no appreciable mass Extremities: No significant cyanosis, clubbing, or edema bilateral lower extremities  Basic Metabolic Panel:  Recent Labs Lab 03/24/17 1827  NA 132*  K 3.9  CL 88*  GLUCOSE 99  BUN 18  CREATININE 7.00*   Coags:  Recent Labs Lab 03/24/17 2342 03/25/17 0858  INR >10.00* 1.17    CBC:  Recent Labs Lab 03/24/17 1816 03/24/17 1827  WBC 7.1  --   NEUTROABS 4.3  --   HGB 9.8* 11.2*  HCT 28.9* 33.0*  MCV 84.8  --   PLT 80*  --     Cardiac Enzymes:  Recent Labs Lab 03/24/17 2050 03/25/17 0104 03/25/17 0858 03/25/17 1233  TROPONINI 0.09* 0.12* 0.11* 0.11*   BNP (last 3 results)  Recent Labs  08/29/16 2259 02/20/17 1706 03/24/17 2050  BNP >4,500.0* 3,193.8* 1,062.0*    Recent Results (from the past 240 hour(s))  MRSA PCR Screening     Status: None   Collection Time: 03/25/17  9:03 AM  Result Value Ref Range Status   MRSA by PCR NEGATIVE NEGATIVE Final    Comment:        The GeneXpert MRSA Assay (FDA approved for NASAL specimens only), is one component of a comprehensive MRSA colonization surveillance program. It is not intended to diagnose MRSA infection nor to guide or monitor treatment for MRSA infections.      Time spent in discharge (includes decision making & examination of pt): 35 minutes  03/26/2017, 10:34 AM   Cherene Altes, MD [facility] Office  [phone] Pager [phone]  On-Call/Text Page:      [email or web address]      password [facility]

## 2017-03-26 NOTE — Discharge Instructions (Signed)
It is VERY IMPORTANT that you keep your scheduled appointments with the Heart Doctor (Cardiologist) and the Lung Doctor (Pulmonologist) as listed on this discharge paper work.    Atrial Fibrillation Atrial fibrillation is a type of heartbeat that is irregular or fast (rapid). If you have this condition, your heart keeps quivering in a weird (chaotic) way. This condition can make it so your heart cannot pump blood normally. Having this condition gives a person more risk for stroke, heart failure, and other heart problems. There are different types of atrial fibrillation. Talk with your doctor to learn about the type that you have. Follow these instructions at home:  Take over-the-counter and prescription medicines only as told by your doctor.  If your doctor prescribed a blood-thinning medicine, take it exactly as told. Taking too much of it can cause bleeding. If you do not take enough of it, you will not have the protection that you need against stroke and other problems.  Do not use any tobacco products. These include cigarettes, chewing tobacco, and e-cigarettes. If you need help quitting, ask your doctor.  If you have apnea (obstructive sleep apnea), manage it as told by your doctor.  Do not drink alcohol.  Do not drink beverages that have caffeine. These include coffee, soda, and tea.  Maintain a healthy weight. Do not use diet pills unless your doctor says they are safe for you. Diet pills may make heart problems worse.  Follow diet instructions as told by your doctor.  Exercise regularly as told by your doctor.  Keep all follow-up visits as told by your doctor. This is important. Contact a doctor if:  You notice a change in the speed, rhythm, or strength of your heartbeat.  You are taking a blood-thinning medicine and you notice more bruising.  You get tired more easily when you move or exercise. Get help right away if:  You have pain in your chest or your belly  (abdomen).  You have sweating or weakness.  You feel sick to your stomach (nauseous).  You notice blood in your throw up (vomit), poop (stool), or pee (urine).  You are short of breath.  You suddenly have swollen feet and ankles.  You feel dizzy.  Your suddenly get weak or numb in your face, arms, or legs, especially if it happens on one side of your body.  You have trouble talking, trouble understanding, or both.  Your face or your eyelid droops on one side. These symptoms may be an emergency. Do not wait to see if the symptoms will go away. Get medical help right away. Call your local emergency services (911 in the [place] Do not drive yourself to the hospital. This information is not intended to replace advice given to you by your health care provider. Make sure you discuss any questions you have with your health care provider. Document Released: 04/06/2008 Document Revised: 12/04/2015 Document Reviewed: 10/23/2014 Elsevier Interactive Patient Education  2018 [place] Antitrypsin Deficiency  Alpha-1 antitrypsin (AAT) is a protein that helps the lungs and other organs to work properly. It is made by the liver. AAT deficiency is a genetic condition that happens when the liver produces too little AAT, no AAT, or AAT that does not work properly. AAT deficiency can cause liver disease, lung disease, and a skin condition that is called panniculitis (rare). Exposure to things like smoke and dust can make AAT deficiency worse. What are the causes? AAT deficiency is caused by a genetic defect (  gene mutation). The gene mutation is passed from parent to child (inherited). The disease typically develops only if a person inherits the defective gene from both parents. What increases the risk? You may be at greater risk for AAT if you:  Have a family history of the condition.  Are Caucasian and have European ancestry.  What are the signs or symptoms?  Shortness of  breath.  Wheezing.  Asthma that is difficult to control.  Coughing.  Recurring infections in the breathing (respiratory) system.  Unexplained weight loss.  Rapid heartbeat.  Fatigue.  Pain in the belly (abdomen).  A yellowish discoloration of the skin, the whites of the eyes, and the mucous membranes (jaundice).  Swelling of the ankles or abdomen.  Hardened skin with painful lumps(panniculitis). How is this diagnosed? Your health care provider can diagnose AAT deficiency with a blood test or with a DNA sample from your mouth. He or she may order additional tests, such as:  Imaging studies of your chest, such as an X-ray or CT scan.  Lung function tests to see how well your lungs are working.  Liver function tests to see how well your liver is working.  A liver biopsy to check for damage to your liver.  How is this treated? There is no cure for AAT deficiency. However, treatments can relieve symptoms and improve quality of life. Treatment options include:  Inhaled steroids or bronchodilators. These can help with breathing problems.  Pulmonary rehabilitation. This helps to improve lung function and teaches you to breathe more efficiently.  AAT augmentation therapy. This involves weekly injections to increase your level of AAT.  Prescription vitamins, including vitamins D, E, and K. Vitamins can help to maintain normal liver function.  Medicine to relieve symptoms that are related to liver problems, including jaundice, fluid retention, and severe itching.  A lung transplant. This is rarely needed. It may help someone who has severe AAT deficiency.  Follow these instructions at home: Lifestyle  Do not use any tobacco products, including cigarettes, chewing tobacco, or electronic cigarettes. If you need help quitting, ask your health care provider.  Avoid secondhand smoke.  Limit alcohol intake to no more than 1 drink per day for nonpregnant women and 2 drinks per  day for men. One drink equals 12 ounces of beer, 5 ounces of wine, or 1 ounces of hard liquor.  Exercise regularly. Reducing Breathing Problems  Stay indoors when air quality is poor.  Wear a mask to keep your airways free of dust.  Talk to your health care provider about getting flu (influenza) and pneumonia vaccines to prevent respiratory infections.  Wash your hands often to prevent infections.  Avoid activities such as vacuuming or mowing when possible. General instructions  Take medicines and vitamins only as directed by your health care provider.  Keep all follow-up visits as directed by your health care provider. This is important. Contact a health care provider if:  You often get respiratory infections, such as bronchitis or pneumonia.  You wheeze, cough, or have other breathing problems that are not helped with medicine.  You experience new liver-related problems, such as jaundice. Get help right away if:  Your respiratory infection does not go away completely, or it returns after treatment.  Your breathing problems are so bad that you become dizzy or weak or you pass out. This information is not intended to replace advice given to you by your health care provider. Make sure you discuss any questions you have with your  health care provider. Document Released: 05/29/2004 Document Revised: 12/04/2015 Document Reviewed: 03/11/2014 Elsevier Interactive Patient Education  2018 [employer].

## 2017-03-26 NOTE — Progress Notes (Signed)
Patient left for hemodialysis at 2230 with transporter. Central monitoring notified of patient placement.

## 2017-03-26 NOTE — Progress Notes (Addendum)
Subjective:  Breathing better. Refusing meds  Objective:  Vital Signs in the last 24 hours: Temp:  [97 F (36.1 C)-98.3 F (36.8 C)] 98.1 F (36.7 C) (09/15 0718) Pulse Rate:  [61-89] 88 (09/15 0718) Resp:  [13-21] 14 (09/15 0718) BP: (85-157)/(21-92) 101/76 (09/15 0718) SpO2:  [91 %-100 %] 91 % (09/15 0728) Weight:  [72 kg (158 lb 11.7 oz)-76.3 kg (168 lb 3.4 oz)] 72 kg (158 lb 11.7 oz) (09/15 0218)  Intake/Output from previous day: 09/14 0701 - 09/15 0700 In: 1870.6 [P.O.:1210; I.V.:660.6] Out: 3556 [Stool:6] Intake/Output from this shift: Total I/O In: 280 [P.O.:280] Out: -   3.5 L negative on dialysis Appears to be in sinus rhythm on telemetry with first degree AV block and IVCD  Vitals:   03/26/17 0718 03/26/17 0728  BP: 101/76   Pulse: 88   Resp: 14   Temp: 98.1 F (36.7 C)   SpO2: 98% 91%    Physical Exam: Nursing note and vitals reviewed. Constitutional: He is oriented to person, place, and time. He appears well-developed and well-nourished.  HENT:  Head: Normocephalic.  Eyes: Conjunctivae are normal.  Neck: JVD present.  Cardiovascular: Normal rate.   Murmur (III/VI holosystolic murmur LLSB, apex) heard. Respiratory: Effort normal and breath sounds normal. He has no wheezes. He has no rales.  GI: Soft. Bowel sounds are normal.  Musculoskeletal: He exhibits no edema.  Neurological: He is alert and oriented to person, place, and time.  Skin: Skin is dry.  Psychiatric: He has a normal mood and affect.   Lab Results:  Recent Labs  03/24/17 1816 03/24/17 1827  WBC 7.1  --   HGB 9.8* 11.2*  PLT 80*  --     Recent Labs  03/24/17 1827  NA 132*  K 3.9  CL 88*  GLUCOSE 99  BUN 18  CREATININE 7.00*    Recent Labs  03/25/17 0858 03/25/17 1233  TROPONINI 0.11* 0.11*    Imaging: 03/24/2017 CLINICAL DATA:  Dyspnea x1 day.  Current smoker.  EXAM: CHEST  2 VIEW  COMPARISON:  02/21/2017  FINDINGS: Stable cardiomegaly with aortic  atherosclerosis and mild central vascular congestion and interstitial edema consistent mild CHF. Left basilar atelectasis is noted.  IMPRESSION: Stable cardiomegaly with mild CHF.  Aortic atherosclerosis.  Cardiac Studies: 02/21/2017 Study Conclusions  - Left ventricle: The cavity size was normal. Wall thickness was   increased in a pattern of severe LVH. There was severe concentric   hypertrophy. Systolic function was mildly to moderately reduced.   The estimated ejection fraction was in the range of 40% to 45%.   Diffuse hypokinesis. The study is not technically sufficient to   allow evaluation of LV diastolic function. - Ventricular septum: Septal motion showed paradox. - Mitral valve: Calcified annulus. The findings are consistent with   moderate stenosis. Valve area by continuity equation (using LVOT   flow): 1.52 cm^2. - Left atrium: The atrium was severely dilated. - Right ventricle: The cavity size was moderately dilated. Systolic   function was mildly reduced. - Right atrium: The atrium was moderately dilated. - Tricuspid valve: There was severe regurgitation. - Pericardium, extracardiac: A trivial pericardial effusion was   identified.  Assessment: Afib w/wRVR, underlying IVCD. Now in sinus rhyhtm Acute on chronic systolic and diastolic heart failure ESRF om HD Polysubstance abuse Medication and diet noncompliance  Recommendations: Admission with heart failure exacerbation with A. fib RVR with underlying IVCD. CHA2DS2VASc score 3.  Not ventricular tachycardia. Now dialyzed. He is  refusing labs. I am concerned about his compliance with meds, follow up visits, which limits our ability to keep him on medications that needs routine follow up and checks, such as warfarin and amiodarone.  Continue ASA/plavix for at least 6 months.I have again re-emphasized importance of dietary compliance, especially with salt restriction.  F/u w/me on 04/07/2017 08:30 AM    LOS: 1 day     [place] Sloop J Carriann Hesse 03/26/2017, 9:58 AM  Nigel Mormon, MD [facility] [place] Pager: [phone] Office: [phone] If no answer Cell [phone]

## 2017-03-26 NOTE — Progress Notes (Signed)
Increased heparin gtt to 12cc/hour per pharmacy order.

## 2017-03-26 NOTE — Progress Notes (Signed)
Patient refused to have labs drawn this morning. Patient back from dialysis and doesn't want labs drawn from right hand. Advised lab tech to come back about 0900 and get all labs including next PTT and try a butterfly on this patient. Will notify MD of patients decision.

## 2017-03-26 NOTE — Progress Notes (Signed)
ANTICOAGULATION CONSULT NOTE - Follow Up Consult  Pharmacy Consult for Heparin Indication: atrial fibrillation  Allergies  Allergen Reactions  . Aspirin Other (See Comments)    STOMACH PAIN  . Clonidine Derivatives Itching  . Tramadol Itching    Patient Measurements: Height: 5\' 9"  (175.3 cm) Weight: 168 lb 3.4 oz (76.3 kg) IBW/kg (Calculated) : 70.7  Vital Signs: Temp: 97 F (36.1 C) (09/14 2230) Temp Source: Oral (09/14 2230) BP: 122/67 (09/15 0030) Pulse Rate: 89 (09/15 0030)  Labs:  Recent Labs  03/24/17 1816 03/24/17 1827  03/24/17 2342 03/25/17 0104 03/25/17 0858 03/25/17 1233 03/26/17 0003  HGB 9.8* 11.2*  --   --   --   --   --   --   HCT 28.9* 33.0*  --   --   --   --   --   --   PLT 80*  --   --   --   --   --   --   --   LABPROT  --   --   --  >90.0*  --  14.8  --   --   INR  --   --   --  >10.00*  --  1.17  --   --   HEPARINUNFRC  --   --   --   --   --   --   --  0.11*  CREATININE  --  7.00*  --   --   --   --   --   --   TROPONINI  --   --   < >  --  0.12* 0.11* 0.11*  --   < > = values in this interval not displayed.  Estimated Creatinine Clearance: 12.1 mL/min (A) (by C-G formula based on SCr of 7 mg/dL (H)).   Medications:  Heparin @ 1000 units/hr  Assessment: 56yom started on heparin earlier today for afib. Initial heparin level is low at 0.11.   Goal of Therapy:  Heparin level 0.3-0.7 units/ml Monitor platelets by anticoagulation protocol: Yes   Plan:  1) Increase heparin to 1200 units/hr 2) Check 8 hour heparin level  Deboraha Sprang 03/26/2017,12:43 AM

## 2017-03-28 DIAGNOSIS — D509 Iron deficiency anemia, unspecified: Secondary | ICD-10-CM | POA: Diagnosis not present

## 2017-03-28 DIAGNOSIS — N2581 Secondary hyperparathyroidism of renal origin: Secondary | ICD-10-CM | POA: Diagnosis not present

## 2017-03-28 DIAGNOSIS — D631 Anemia in chronic kidney disease: Secondary | ICD-10-CM | POA: Diagnosis not present

## 2017-03-28 DIAGNOSIS — N186 End stage renal disease: Secondary | ICD-10-CM | POA: Diagnosis not present

## 2017-03-29 DIAGNOSIS — G8929 Other chronic pain: Secondary | ICD-10-CM | POA: Diagnosis not present

## 2017-03-29 DIAGNOSIS — M79602 Pain in left arm: Secondary | ICD-10-CM | POA: Diagnosis not present

## 2017-03-30 DIAGNOSIS — D509 Iron deficiency anemia, unspecified: Secondary | ICD-10-CM | POA: Diagnosis not present

## 2017-03-30 DIAGNOSIS — N186 End stage renal disease: Secondary | ICD-10-CM | POA: Diagnosis not present

## 2017-03-30 DIAGNOSIS — D631 Anemia in chronic kidney disease: Secondary | ICD-10-CM | POA: Diagnosis not present

## 2017-03-30 DIAGNOSIS — N2581 Secondary hyperparathyroidism of renal origin: Secondary | ICD-10-CM | POA: Diagnosis not present

## 2017-04-01 DIAGNOSIS — D509 Iron deficiency anemia, unspecified: Secondary | ICD-10-CM | POA: Diagnosis not present

## 2017-04-01 DIAGNOSIS — N2581 Secondary hyperparathyroidism of renal origin: Secondary | ICD-10-CM | POA: Diagnosis not present

## 2017-04-01 DIAGNOSIS — N186 End stage renal disease: Secondary | ICD-10-CM | POA: Diagnosis not present

## 2017-04-01 DIAGNOSIS — D631 Anemia in chronic kidney disease: Secondary | ICD-10-CM | POA: Diagnosis not present

## 2017-04-04 DIAGNOSIS — N186 End stage renal disease: Secondary | ICD-10-CM | POA: Diagnosis not present

## 2017-04-04 DIAGNOSIS — D631 Anemia in chronic kidney disease: Secondary | ICD-10-CM | POA: Diagnosis not present

## 2017-04-04 DIAGNOSIS — D509 Iron deficiency anemia, unspecified: Secondary | ICD-10-CM | POA: Diagnosis not present

## 2017-04-04 DIAGNOSIS — N2581 Secondary hyperparathyroidism of renal origin: Secondary | ICD-10-CM | POA: Diagnosis not present

## 2017-04-06 DIAGNOSIS — N186 End stage renal disease: Secondary | ICD-10-CM | POA: Diagnosis not present

## 2017-04-06 DIAGNOSIS — D631 Anemia in chronic kidney disease: Secondary | ICD-10-CM | POA: Diagnosis not present

## 2017-04-06 DIAGNOSIS — D509 Iron deficiency anemia, unspecified: Secondary | ICD-10-CM | POA: Diagnosis not present

## 2017-04-06 DIAGNOSIS — N2581 Secondary hyperparathyroidism of renal origin: Secondary | ICD-10-CM | POA: Diagnosis not present

## 2017-04-07 DIAGNOSIS — F17200 Nicotine dependence, unspecified, uncomplicated: Secondary | ICD-10-CM | POA: Diagnosis not present

## 2017-04-07 DIAGNOSIS — I10 Essential (primary) hypertension: Secondary | ICD-10-CM | POA: Diagnosis not present

## 2017-04-08 DIAGNOSIS — N186 End stage renal disease: Secondary | ICD-10-CM | POA: Diagnosis not present

## 2017-04-08 DIAGNOSIS — N2581 Secondary hyperparathyroidism of renal origin: Secondary | ICD-10-CM | POA: Diagnosis not present

## 2017-04-08 DIAGNOSIS — D509 Iron deficiency anemia, unspecified: Secondary | ICD-10-CM | POA: Diagnosis not present

## 2017-04-08 DIAGNOSIS — D631 Anemia in chronic kidney disease: Secondary | ICD-10-CM | POA: Diagnosis not present

## 2017-04-08 IMAGING — CT CT ABD-PELV W/ CM
2 of 5 series · 15 of 46 positions shown, 17 images · IV contrast (Omni 300)
Comparison: Abdomen ultrasound 12/23/2010.

CLINICAL DATA: 55-year-old male dialysis patient with right side
abdominal pain since 2288 hours yesterday and fever. Hepatitis-B and
[place]. Initial encounter.

EXAM:
CT ABDOMEN AND PELVIS WITH CONTRAST
TECHNIQUE: Multidetector CT imaging of the abdomen and pelvis was performed
using the standard protocol following bolus administration of
intravenous contrast.
CONTRAST:  100mL [id number] IOPAMIDOL ([id number]-022) INJECTION 61%

[Series 6: a/p w/ cor · coronal · 0.77mm/px · 3 of 141 slices shown]
[im 47/141  soft-tissue]
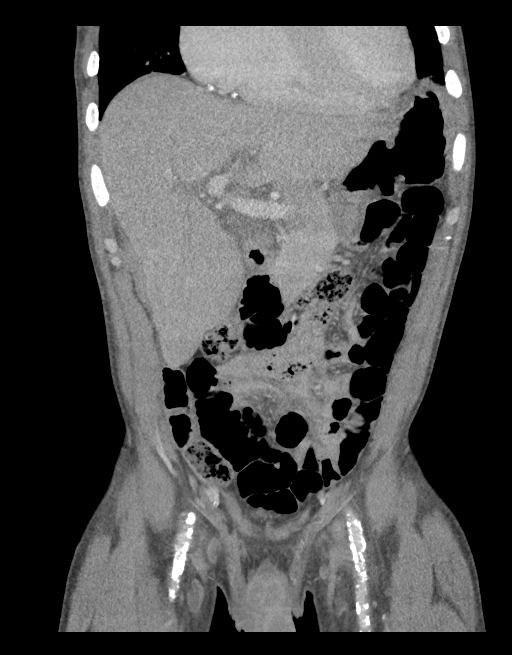
[im 63/141  soft-tissue]
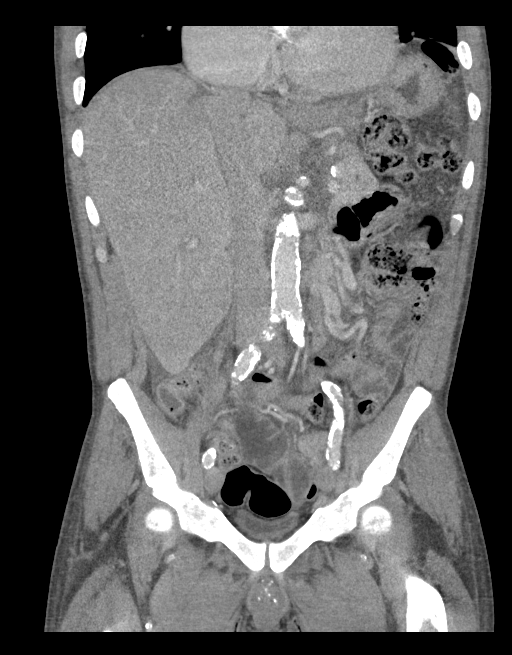
[im 78/141  soft-tissue]
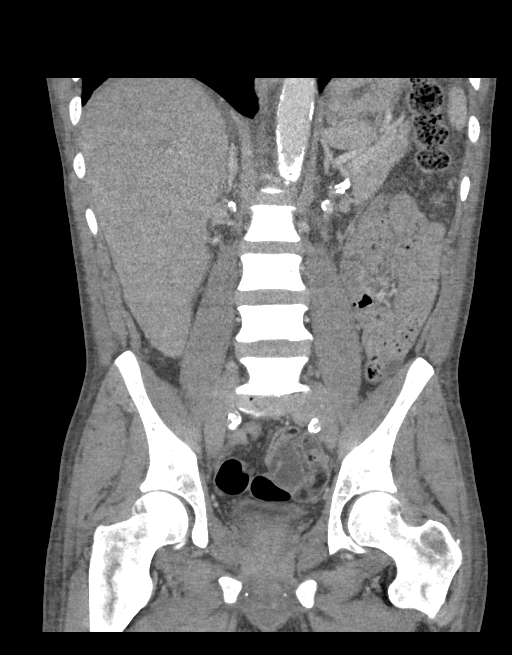

[Series 9: a/p w/ sag · axial · 0.71mm/px · z∈[-484,-64]mm · 12 of 94 slices shown, 14 images]
[im 5/94  soft-tissue]
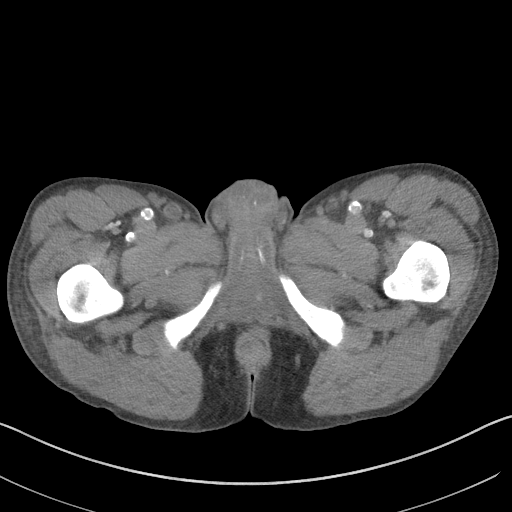
[im 5/94  bone]
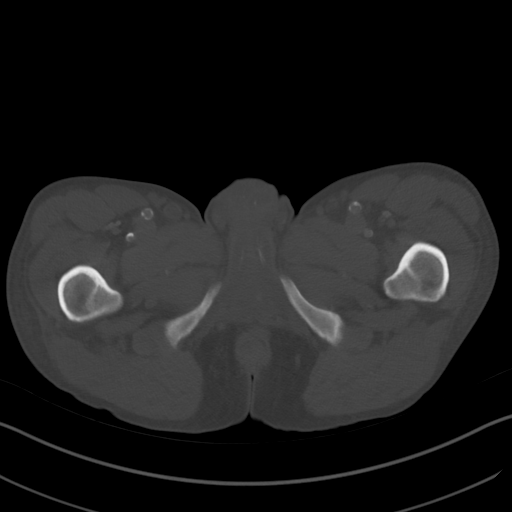
[im 14/94  soft-tissue]
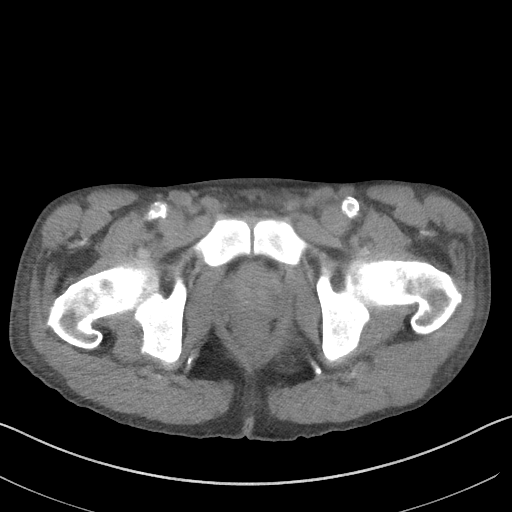
[im 23/94  soft-tissue]
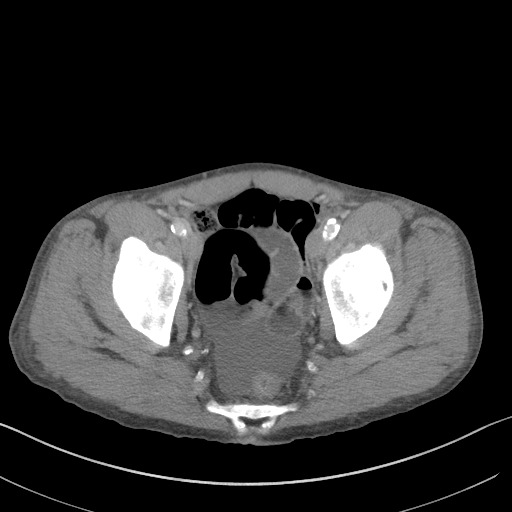
[im 27/94  soft-tissue]
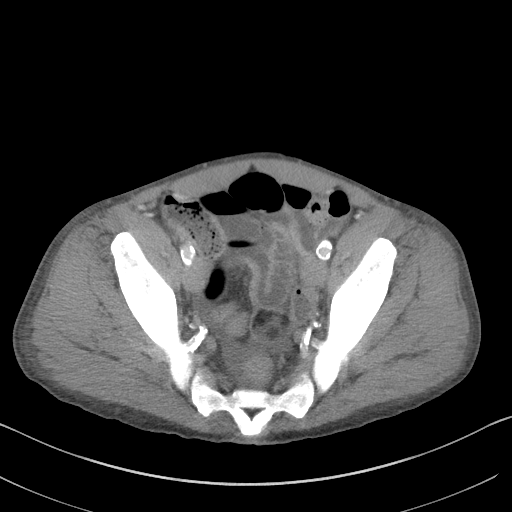
[im 36/94  soft-tissue]
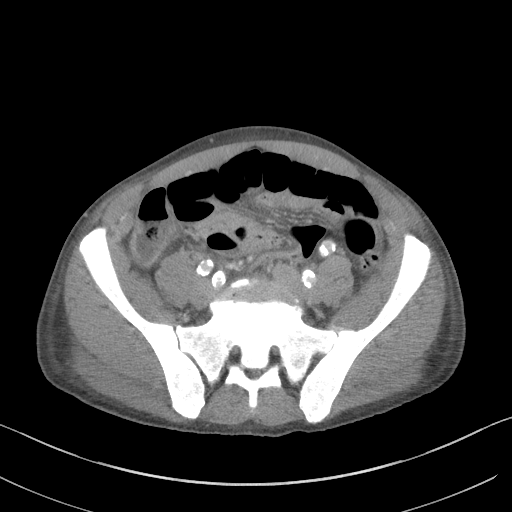
[im 45/94  soft-tissue]
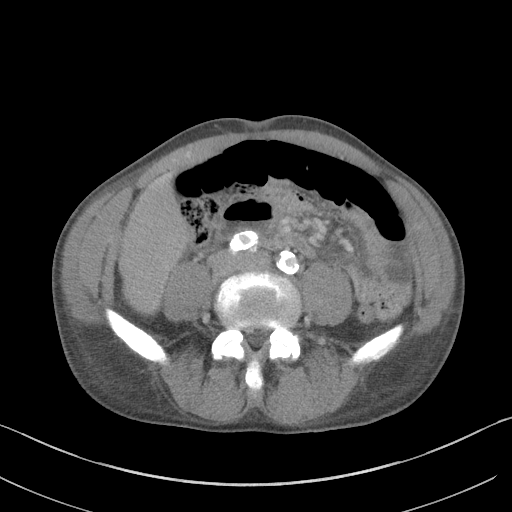
[im 49/94  soft-tissue]
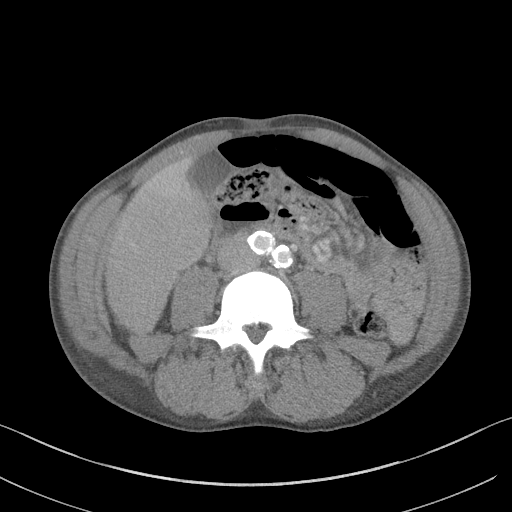
[im 58/94  soft-tissue]
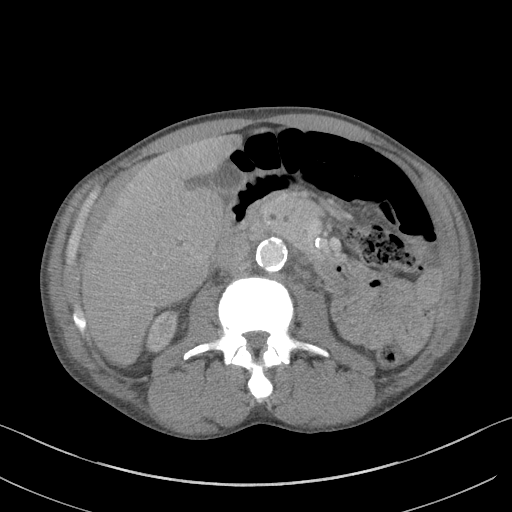
[im 67/94  soft-tissue]
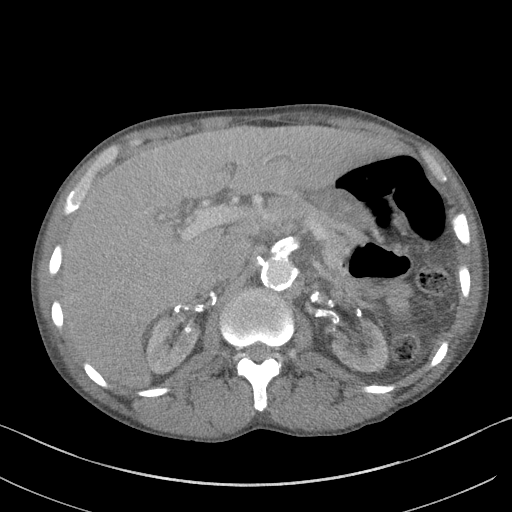
[im 67/94  bone]
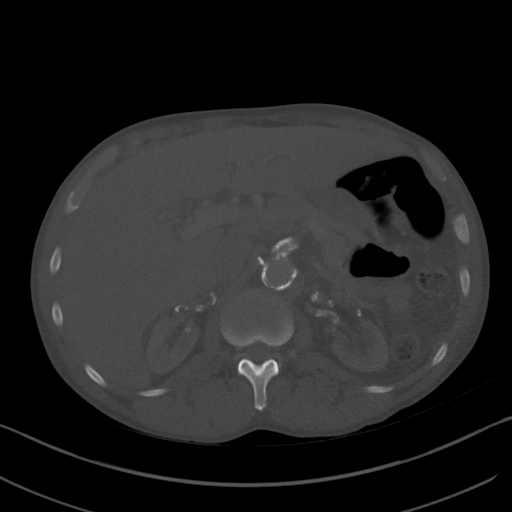
[im 71/94  soft-tissue]
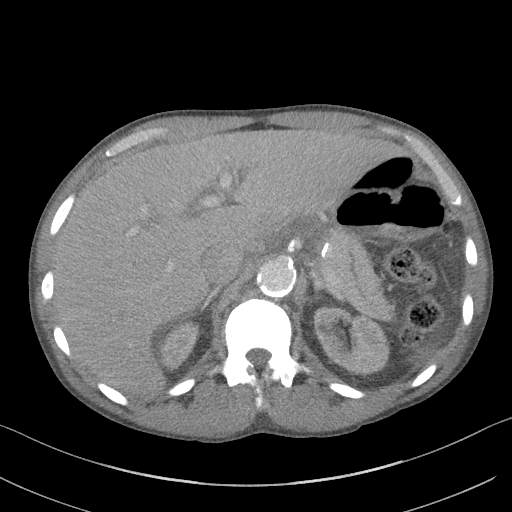
[im 80/94  soft-tissue]
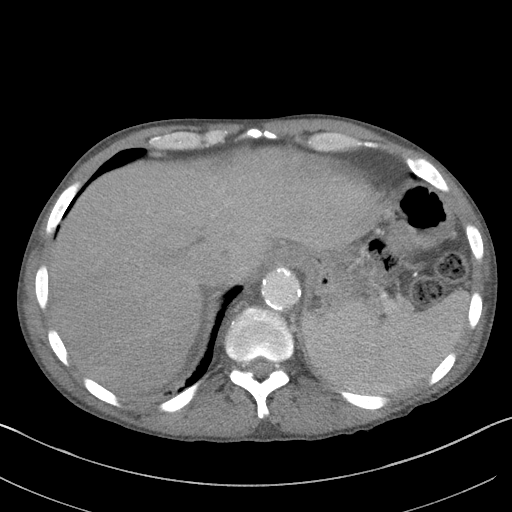
[im 89/94  soft-tissue]
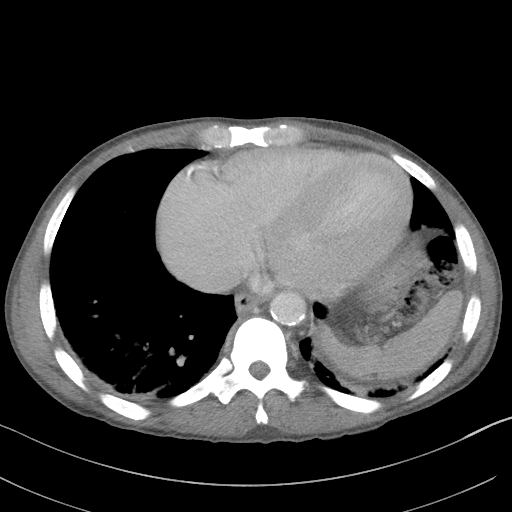

[15 of 46 positions shown; findings below may reference images not displayed]

FINDINGS: Cardiomegaly. No pericardial effusion. Elevation of the left
hemidiaphragm with basilar and dependent lower lobe pulmonary
opacity bilaterally which most resembles atelectasis. No pleural
effusion.

Diffuse sclerosis of osseous structures suggesting renal
osteodystrophy. Advanced lumbosacral junction disc and endplate
degeneration. No acute osseous abnormality identified.

Advanced and diffuse calcified atherosclerosis of the aorta, and all
major arterial structures throughout the abdomen and pelvis.
Calcified atherosclerosis continues in the bilateral femoral
arteries despite the extent of atherosclerotic disease major
arterial structures remain patent.

Small volume pelvic free fluid with simple fluid densitometry.
Decompressed rectum and sigmoid colon. Decompressed urinary bladder.
Distal small bowel and colonic loops in the pelvis are of similar
caliber and difficult to differentiate. There is fluid in lipid
material in distal bowel loops incidentally noted (coronal image
96). No abnormally dilated loops. The appendix is identified along
the right pelvic side wall on series 9, image 66 and appears within
normal limits. Redundant large bowel containing stool, but still
relatively decompressed of the splenic flexure.

No abdominal free air. Stomach and duodenum largely are
decompressed.

Hepatomegaly with periportal edema centrally. Trace free fluid about
the liver, primarily at the porta hepatis. No definite discrete
liver lesion. The spleen, pancreas and adrenal glands are within
normal limits.

Native renal atrophy. As expected, no renal contrast excretion on
delayed images. No definite lymphadenopathy in the abdomen or
pelvis.
IMPRESSION: 1. Nonspecific small volume of ascites in the abdomen and pelvis.
Hepatomegaly with central periportal edema. Query acute on chronic
hepatitis.
2. No evidence of bowel obstruction. Small and large bowel loops are
difficult to differentiate in the lower abdomen and pelvis.
3. Severe diffuse calcified atherosclerosis of the aorta and all
other major arterial structures in the abdomen and pelvis. Despite
this major arterial structures remain patent.
4. Cardiomegaly.  Native renal atrophy.
5. Mild lung base atelectasis.
6. Renal osteodystrophy.

## 2017-04-10 DIAGNOSIS — Z992 Dependence on renal dialysis: Secondary | ICD-10-CM | POA: Diagnosis not present

## 2017-04-10 DIAGNOSIS — I120 Hypertensive chronic kidney disease with stage 5 chronic kidney disease or end stage renal disease: Secondary | ICD-10-CM | POA: Diagnosis not present

## 2017-04-10 DIAGNOSIS — N186 End stage renal disease: Secondary | ICD-10-CM | POA: Diagnosis not present

## 2017-04-11 DIAGNOSIS — D631 Anemia in chronic kidney disease: Secondary | ICD-10-CM | POA: Diagnosis not present

## 2017-04-11 DIAGNOSIS — D509 Iron deficiency anemia, unspecified: Secondary | ICD-10-CM | POA: Diagnosis not present

## 2017-04-11 DIAGNOSIS — N186 End stage renal disease: Secondary | ICD-10-CM | POA: Diagnosis not present

## 2017-04-11 DIAGNOSIS — N2581 Secondary hyperparathyroidism of renal origin: Secondary | ICD-10-CM | POA: Diagnosis not present

## 2017-04-13 DIAGNOSIS — N186 End stage renal disease: Secondary | ICD-10-CM | POA: Diagnosis not present

## 2017-04-13 DIAGNOSIS — D509 Iron deficiency anemia, unspecified: Secondary | ICD-10-CM | POA: Diagnosis not present

## 2017-04-13 DIAGNOSIS — D631 Anemia in chronic kidney disease: Secondary | ICD-10-CM | POA: Diagnosis not present

## 2017-04-13 DIAGNOSIS — N2581 Secondary hyperparathyroidism of renal origin: Secondary | ICD-10-CM | POA: Diagnosis not present

## 2017-04-14 IMAGING — MR MR FOREARM*L* W/O CM
4 series · 17 of 40 positions shown · non-contrast
Comparison: None.

CLINICAL DATA: Left arm fistula for 5 years. Numbness and pain down
the left arm to the fingers for 3-4 months.

EXAM:
MRI OF THE LEFT FOREARM WITHOUT CONTRAST
TECHNIQUE: Multiplanar, multisequence MR imaging was performed. No intravenous
contrast was administered.

[Series 5: T1 · axial · 5.0mm · 0.19mm/px · z∈[-13,+101]mm · 3 of 24 slices shown]
[im 3/24]
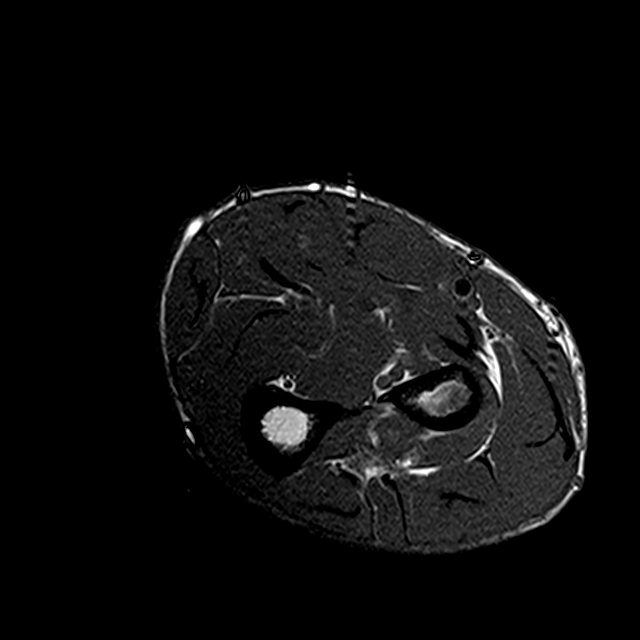
[im 13/24]
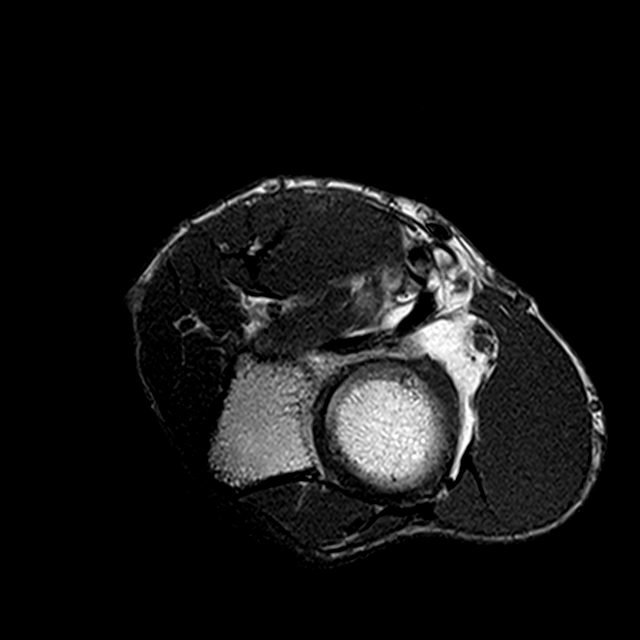
[im 21/24]
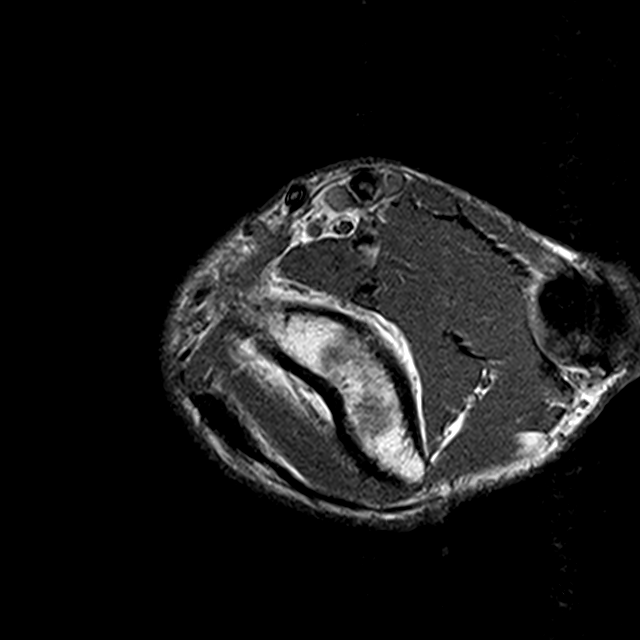

[Series 6: T2 fat-sat · axial · 5.0mm · 0.23mm/px · z∈[-13,+101]mm · 3 of 24 slices shown (1 of 2)]
[im 3/24]
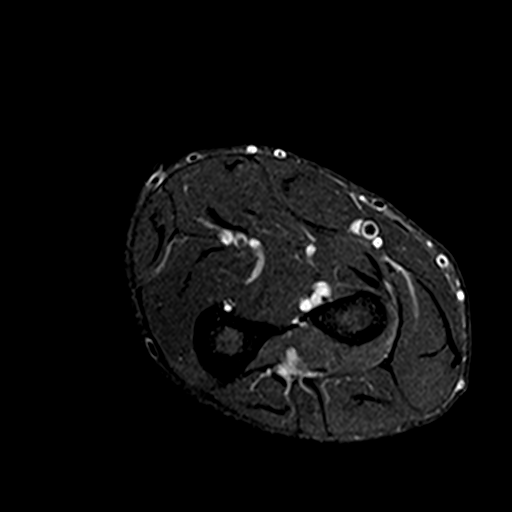
[im 13/24]
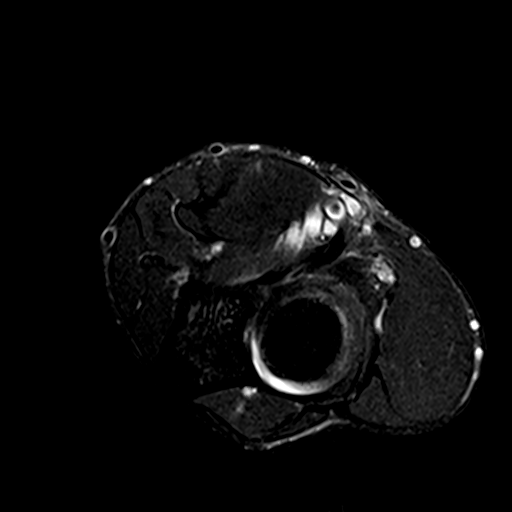
[im 21/24]
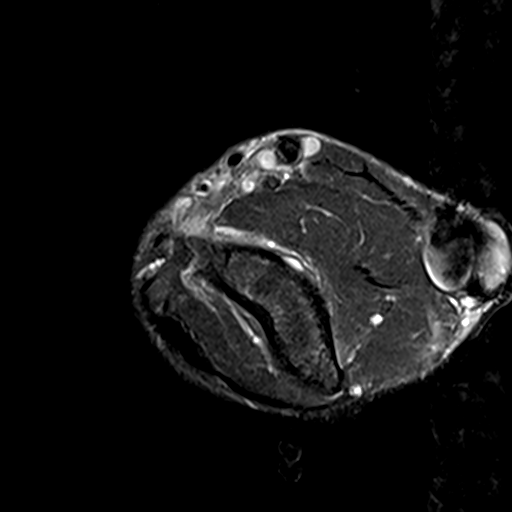

[Series 8: T2 fat-sat · coronal · 3.0mm · 0.39mm/px · 3 of 24 slices shown (2 of 2)]
[im 3/24]
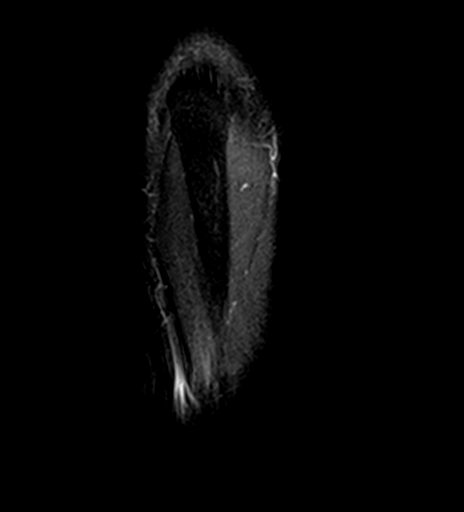
[im 13/24]
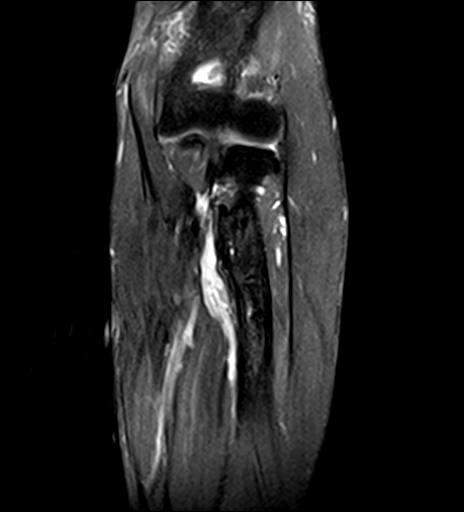
[im 21/24]
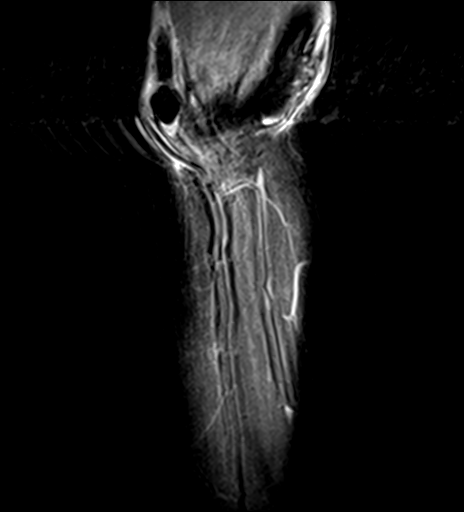

[Series 9: PD fat-sat · sagittal · 3.2mm · 0.31mm/px · 8 of 23 slices shown]
[im 1/23]
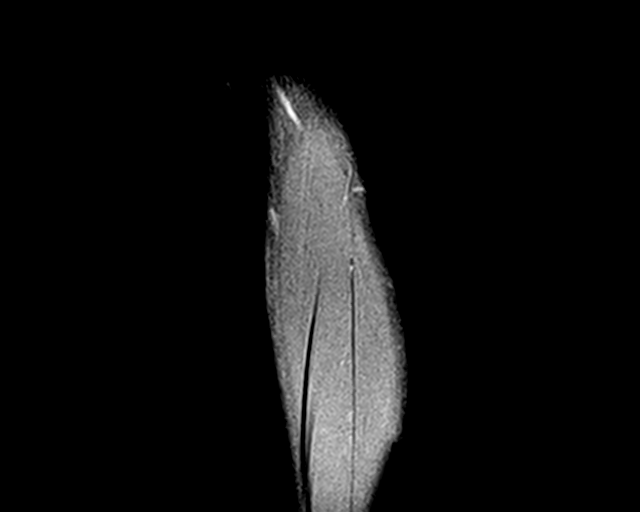
[im 3/23]
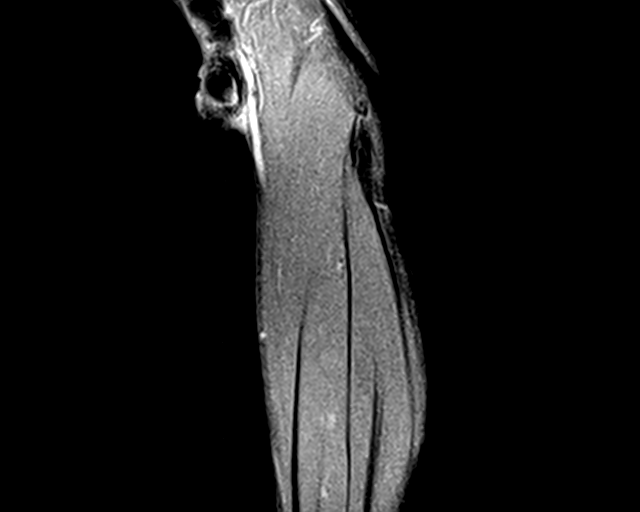
[im 8/23]
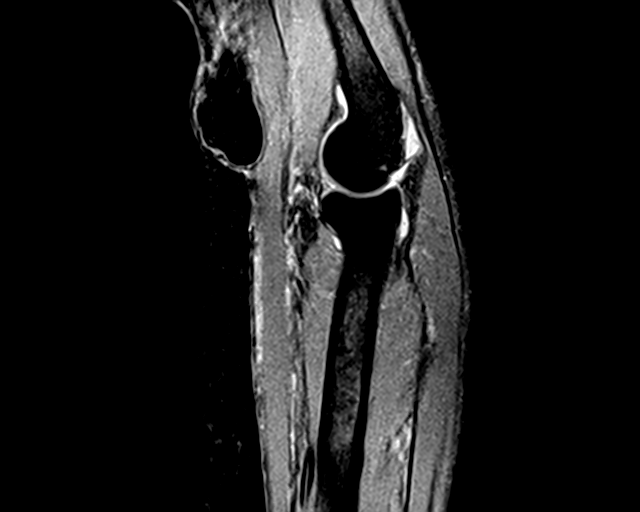
[im 10/23]
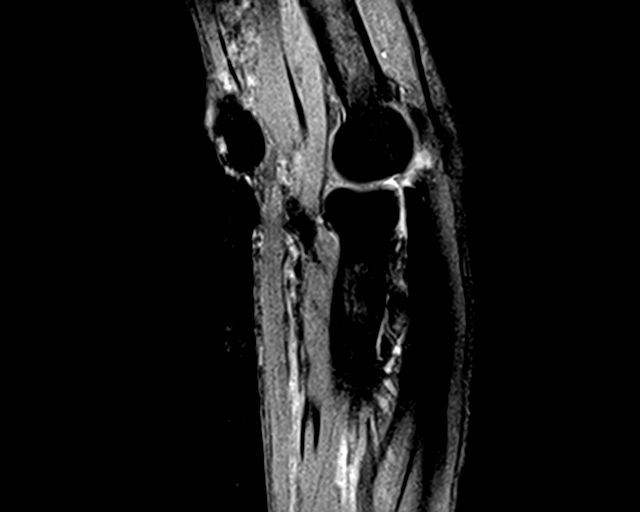
[im 13/23]
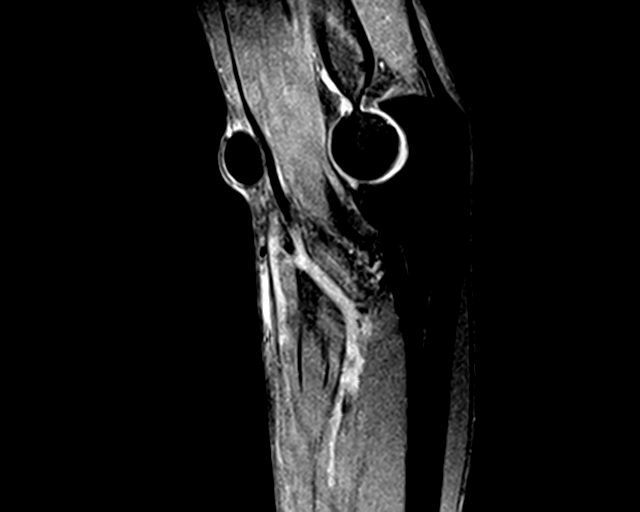
[im 15/23]
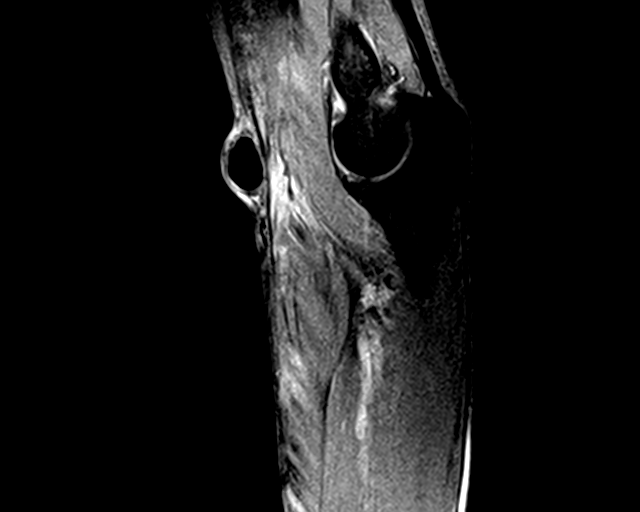
[im 20/23]
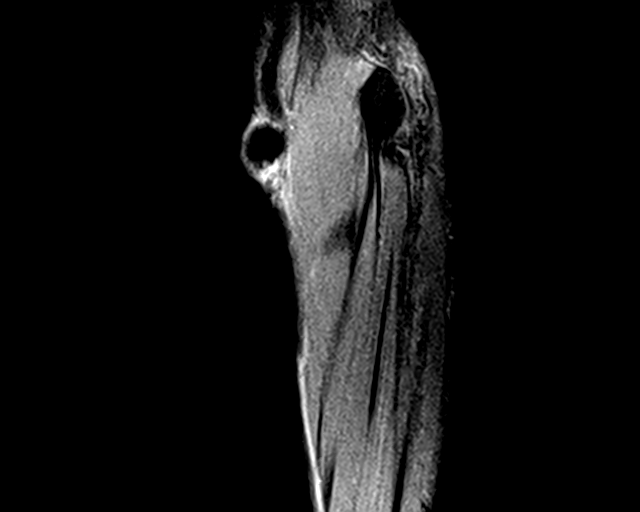
[im 23/23]
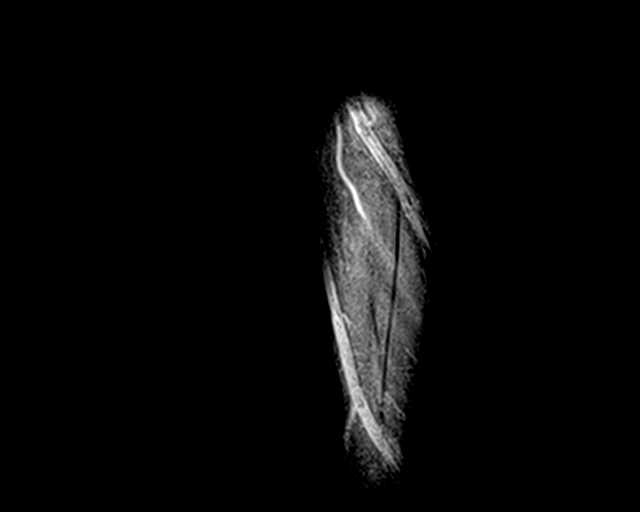

[17 of 40 positions shown; findings below may reference images not displayed]

FINDINGS: No acute fracture or dislocation. No periosteal reaction or bone
destruction. No aggressive osseous lesion. Moderate tendinosis with
a partial tear of the common extensor tendon origin. Intact common
flexor tendon origin. Intact biceps tendon. Intact triceps tendon.
Muscles are normal in signal. No muscle edema or atrophy. Cubital
tunnel is normal.

Dialysis arteriovenous fistula in the distal upper arm with
aneurysmal dilatation of the artery. Numerous collateral vessels in
the antecubital fossa.
IMPRESSION: 1. Dialysis arteriovenous fistula in the distal upper arm with
aneurysmal dilatation of the artery. Numerous collateral vessels in
the antecubital fossa.
2. Moderate tendinosis with a partial tear of the common extensor
tendon origin.

## 2017-04-15 DIAGNOSIS — D631 Anemia in chronic kidney disease: Secondary | ICD-10-CM | POA: Diagnosis not present

## 2017-04-15 DIAGNOSIS — N186 End stage renal disease: Secondary | ICD-10-CM | POA: Diagnosis not present

## 2017-04-15 DIAGNOSIS — N2581 Secondary hyperparathyroidism of renal origin: Secondary | ICD-10-CM | POA: Diagnosis not present

## 2017-04-15 DIAGNOSIS — D509 Iron deficiency anemia, unspecified: Secondary | ICD-10-CM | POA: Diagnosis not present

## 2017-04-18 DIAGNOSIS — D631 Anemia in chronic kidney disease: Secondary | ICD-10-CM | POA: Diagnosis not present

## 2017-04-18 DIAGNOSIS — D509 Iron deficiency anemia, unspecified: Secondary | ICD-10-CM | POA: Diagnosis not present

## 2017-04-18 DIAGNOSIS — N2581 Secondary hyperparathyroidism of renal origin: Secondary | ICD-10-CM | POA: Diagnosis not present

## 2017-04-18 DIAGNOSIS — N186 End stage renal disease: Secondary | ICD-10-CM | POA: Diagnosis not present

## 2017-04-20 DIAGNOSIS — N2581 Secondary hyperparathyroidism of renal origin: Secondary | ICD-10-CM | POA: Diagnosis not present

## 2017-04-20 DIAGNOSIS — N186 End stage renal disease: Secondary | ICD-10-CM | POA: Diagnosis not present

## 2017-04-20 DIAGNOSIS — D509 Iron deficiency anemia, unspecified: Secondary | ICD-10-CM | POA: Diagnosis not present

## 2017-04-20 DIAGNOSIS — D631 Anemia in chronic kidney disease: Secondary | ICD-10-CM | POA: Diagnosis not present

## 2017-04-22 ENCOUNTER — Inpatient Hospital Stay (HOSPITAL_COMMUNITY)
Admission: EM | Admit: 2017-04-22 | Discharge: 2017-04-25 | DRG: 189 | Disposition: A | Discharge status: Home / Self Care | Payer: Medicare Other | Attending: Nephrology | Admitting: Nephrology

## 2017-04-22 ENCOUNTER — Emergency Department (HOSPITAL_COMMUNITY): Payer: Medicare Other

## 2017-04-22 DIAGNOSIS — E871 Hypo-osmolality and hyponatremia: Secondary | ICD-10-CM | POA: Diagnosis present

## 2017-04-22 DIAGNOSIS — R6881 Early satiety: Secondary | ICD-10-CM | POA: Diagnosis present

## 2017-04-22 DIAGNOSIS — R188 Other ascites: Secondary | ICD-10-CM | POA: Diagnosis present

## 2017-04-22 DIAGNOSIS — N2581 Secondary hyperparathyroidism of renal origin: Secondary | ICD-10-CM | POA: Diagnosis not present

## 2017-04-22 DIAGNOSIS — J9601 Acute respiratory failure with hypoxia: Secondary | ICD-10-CM | POA: Diagnosis present

## 2017-04-22 DIAGNOSIS — I447 Left bundle-branch block, unspecified: Secondary | ICD-10-CM | POA: Diagnosis present

## 2017-04-22 DIAGNOSIS — Z955 Presence of coronary angioplasty implant and graft: Secondary | ICD-10-CM

## 2017-04-22 DIAGNOSIS — Z9119 Patient's noncompliance with other medical treatment and regimen: Secondary | ICD-10-CM

## 2017-04-22 DIAGNOSIS — F17210 Nicotine dependence, cigarettes, uncomplicated: Secondary | ICD-10-CM | POA: Diagnosis present

## 2017-04-22 DIAGNOSIS — R451 Restlessness and agitation: Secondary | ICD-10-CM | POA: Diagnosis present

## 2017-04-22 DIAGNOSIS — Z992 Dependence on renal dialysis: Secondary | ICD-10-CM

## 2017-04-22 DIAGNOSIS — Z808 Family history of malignant neoplasm of other organs or systems: Secondary | ICD-10-CM

## 2017-04-22 DIAGNOSIS — I313 Pericardial effusion (noninflammatory): Secondary | ICD-10-CM | POA: Diagnosis present

## 2017-04-22 DIAGNOSIS — K219 Gastro-esophageal reflux disease without esophagitis: Secondary | ICD-10-CM | POA: Diagnosis present

## 2017-04-22 DIAGNOSIS — D509 Iron deficiency anemia, unspecified: Secondary | ICD-10-CM | POA: Diagnosis not present

## 2017-04-22 DIAGNOSIS — R110 Nausea: Secondary | ICD-10-CM | POA: Diagnosis not present

## 2017-04-22 DIAGNOSIS — E8889 Other specified metabolic disorders: Secondary | ICD-10-CM | POA: Diagnosis present

## 2017-04-22 DIAGNOSIS — Z8673 Personal history of transient ischemic attack (TIA), and cerebral infarction without residual deficits: Secondary | ICD-10-CM

## 2017-04-22 DIAGNOSIS — D649 Anemia, unspecified: Secondary | ICD-10-CM | POA: Diagnosis present

## 2017-04-22 DIAGNOSIS — I10 Essential (primary) hypertension: Secondary | ICD-10-CM | POA: Diagnosis present

## 2017-04-22 DIAGNOSIS — K7460 Unspecified cirrhosis of liver: Secondary | ICD-10-CM | POA: Diagnosis present

## 2017-04-22 DIAGNOSIS — R791 Abnormal coagulation profile: Secondary | ICD-10-CM | POA: Diagnosis present

## 2017-04-22 DIAGNOSIS — K761 Chronic passive congestion of liver: Secondary | ICD-10-CM | POA: Diagnosis present

## 2017-04-22 DIAGNOSIS — R634 Abnormal weight loss: Secondary | ICD-10-CM | POA: Diagnosis present

## 2017-04-22 DIAGNOSIS — R0600 Dyspnea, unspecified: Secondary | ICD-10-CM | POA: Diagnosis not present

## 2017-04-22 DIAGNOSIS — D696 Thrombocytopenia, unspecified: Secondary | ICD-10-CM | POA: Diagnosis present

## 2017-04-22 DIAGNOSIS — I248 Other forms of acute ischemic heart disease: Secondary | ICD-10-CM | POA: Diagnosis present

## 2017-04-22 DIAGNOSIS — Z801 Family history of malignant neoplasm of trachea, bronchus and lung: Secondary | ICD-10-CM

## 2017-04-22 DIAGNOSIS — I480 Paroxysmal atrial fibrillation: Secondary | ICD-10-CM | POA: Diagnosis present

## 2017-04-22 DIAGNOSIS — D631 Anemia in chronic kidney disease: Secondary | ICD-10-CM | POA: Diagnosis not present

## 2017-04-22 DIAGNOSIS — Z5329 Procedure and treatment not carried out because of patient's decision for other reasons: Secondary | ICD-10-CM | POA: Diagnosis present

## 2017-04-22 DIAGNOSIS — B181 Chronic viral hepatitis B without delta-agent: Secondary | ICD-10-CM | POA: Diagnosis present

## 2017-04-22 DIAGNOSIS — Z885 Allergy status to narcotic agent status: Secondary | ICD-10-CM

## 2017-04-22 DIAGNOSIS — Z8249 Family history of ischemic heart disease and other diseases of the circulatory system: Secondary | ICD-10-CM

## 2017-04-22 DIAGNOSIS — Z825 Family history of asthma and other chronic lower respiratory diseases: Secondary | ICD-10-CM

## 2017-04-22 DIAGNOSIS — G9341 Metabolic encephalopathy: Secondary | ICD-10-CM | POA: Diagnosis present

## 2017-04-22 DIAGNOSIS — R109 Unspecified abdominal pain: Secondary | ICD-10-CM | POA: Diagnosis not present

## 2017-04-22 DIAGNOSIS — R0602 Shortness of breath: Secondary | ICD-10-CM | POA: Diagnosis not present

## 2017-04-22 DIAGNOSIS — M19012 Primary osteoarthritis, left shoulder: Secondary | ICD-10-CM | POA: Diagnosis present

## 2017-04-22 DIAGNOSIS — Z79899 Other long term (current) drug therapy: Secondary | ICD-10-CM

## 2017-04-22 DIAGNOSIS — I2510 Atherosclerotic heart disease of native coronary artery without angina pectoris: Secondary | ICD-10-CM | POA: Diagnosis present

## 2017-04-22 DIAGNOSIS — J810 Acute pulmonary edema: Secondary | ICD-10-CM | POA: Diagnosis not present

## 2017-04-22 DIAGNOSIS — Z79891 Long term (current) use of opiate analgesic: Secondary | ICD-10-CM

## 2017-04-22 DIAGNOSIS — E875 Hyperkalemia: Secondary | ICD-10-CM | POA: Diagnosis present

## 2017-04-22 DIAGNOSIS — Z7902 Long term (current) use of antithrombotics/antiplatelets: Secondary | ICD-10-CM

## 2017-04-22 DIAGNOSIS — Z7951 Long term (current) use of inhaled steroids: Secondary | ICD-10-CM

## 2017-04-22 DIAGNOSIS — E8801 Alpha-1-antitrypsin deficiency: Secondary | ICD-10-CM | POA: Diagnosis present

## 2017-04-22 DIAGNOSIS — N186 End stage renal disease: Secondary | ICD-10-CM | POA: Diagnosis present

## 2017-04-22 DIAGNOSIS — Z886 Allergy status to analgesic agent status: Secondary | ICD-10-CM

## 2017-04-22 DIAGNOSIS — J449 Chronic obstructive pulmonary disease, unspecified: Secondary | ICD-10-CM | POA: Diagnosis present

## 2017-04-22 DIAGNOSIS — E8770 Fluid overload, unspecified: Secondary | ICD-10-CM | POA: Diagnosis present

## 2017-04-22 DIAGNOSIS — I700 Atherosclerosis of aorta: Secondary | ICD-10-CM | POA: Diagnosis present

## 2017-04-22 DIAGNOSIS — Z87442 Personal history of urinary calculi: Secondary | ICD-10-CM

## 2017-04-22 DIAGNOSIS — B182 Chronic viral hepatitis C: Secondary | ICD-10-CM | POA: Diagnosis present

## 2017-04-22 DIAGNOSIS — Z9114 Patient's other noncompliance with medication regimen: Secondary | ICD-10-CM

## 2017-04-22 DIAGNOSIS — I4892 Unspecified atrial flutter: Secondary | ICD-10-CM | POA: Diagnosis present

## 2017-04-22 DIAGNOSIS — Z7982 Long term (current) use of aspirin: Secondary | ICD-10-CM

## 2017-04-22 DIAGNOSIS — F1910 Other psychoactive substance abuse, uncomplicated: Secondary | ICD-10-CM | POA: Diagnosis present

## 2017-04-22 DIAGNOSIS — I1311 Hypertensive heart and chronic kidney disease without heart failure, with stage 5 chronic kidney disease, or end stage renal disease: Secondary | ICD-10-CM | POA: Diagnosis present

## 2017-04-22 DIAGNOSIS — F419 Anxiety disorder, unspecified: Secondary | ICD-10-CM | POA: Diagnosis present

## 2017-04-22 DIAGNOSIS — Z86010 Personal history of colonic polyps: Secondary | ICD-10-CM

## 2017-04-22 DIAGNOSIS — Z800 Family history of malignant neoplasm of digestive organs: Secondary | ICD-10-CM

## 2017-04-22 DIAGNOSIS — R7989 Other specified abnormal findings of blood chemistry: Secondary | ICD-10-CM | POA: Diagnosis present

## 2017-04-22 DIAGNOSIS — F1010 Alcohol abuse, uncomplicated: Secondary | ICD-10-CM | POA: Diagnosis present

## 2017-04-22 DIAGNOSIS — Z9115 Patient's noncompliance with renal dialysis: Secondary | ICD-10-CM

## 2017-04-22 LAB — COMPREHENSIVE METABOLIC PANEL
ALT: 10 U/L — ABNORMAL LOW (ref 17–63)
AST: 25 U/L (ref 15–41)
Albumin: 4 g/dL (ref 3.5–5.0)
Alkaline Phosphatase: 117 U/L (ref 38–126)
Anion gap: 11 (ref 5–15)
BUN: 9 mg/dL (ref 6–20)
CO2: 30 mmol/L (ref 22–32)
Calcium: 7.8 mg/dL — ABNORMAL LOW (ref 8.9–10.3)
Chloride: 91 mmol/L — ABNORMAL LOW (ref 101–111)
Creatinine, Ser: 6.1 mg/dL — ABNORMAL HIGH (ref 0.61–1.24)
GFR calc Af Amer: 11 mL/min — ABNORMAL LOW (ref >60)
GFR calc non Af Amer: 9 mL/min — ABNORMAL LOW (ref >60)
Glucose, Bld: 112 mg/dL — ABNORMAL HIGH (ref 65–99)
Potassium: 5.3 mmol/L — ABNORMAL HIGH (ref 3.5–5.1)
Sodium: 132 mmol/L — ABNORMAL LOW (ref 135–145)
Total Bilirubin: 1.1 mg/dL (ref 0.3–1.2)
Total Protein: 7.2 g/dL (ref 6.5–8.1)

## 2017-04-22 LAB — CBC
HCT: 29.3 % — ABNORMAL LOW (ref 39.0–52.0)
Hemoglobin: 9.8 g/dL — ABNORMAL LOW (ref 13.0–17.0)
MCH: 27.8 pg (ref 26.0–34.0)
MCHC: 33.4 g/dL (ref 30.0–36.0)
MCV: 83 fL (ref 78.0–100.0)
Platelets: 57 10*3/uL — ABNORMAL LOW (ref 150–400)
RBC: 3.53 MIL/uL — ABNORMAL LOW (ref 4.22–5.81)
RDW: 17.2 % — ABNORMAL HIGH (ref 11.5–15.5)
WBC: 7.7 10*3/uL (ref 4.0–10.5)

## 2017-04-22 LAB — I-STAT TROPONIN, ED: Troponin i, poc: 0.02 ng/mL (ref 0.00–0.08)

## 2017-04-22 LAB — LIPASE, BLOOD: Lipase: 28 U/L (ref 11–51)

## 2017-04-22 MED ORDER — METOCLOPRAMIDE HCL 5 MG/ML IJ SOLN
10.0000 mg | Freq: Once | INTRAMUSCULAR | Status: AC
  Administered 2017-04-22: 10 mg via INTRAVENOUS
  Filled 2017-04-22: qty 2

## 2017-04-22 MED ORDER — ONDANSETRON HCL 4 MG/2ML IJ SOLN
4.0000 mg | Freq: Once | INTRAMUSCULAR | Status: AC
  Administered 2017-04-22: 4 mg via INTRAVENOUS
  Filled 2017-04-22: qty 2

## 2017-04-22 MED ORDER — IOPAMIDOL (ISOVUE-300) INJECTION 61%
100.0000 mL | Freq: Once | INTRAVENOUS | Status: AC | PRN
  Administered 2017-04-22: 100 mL via INTRAVENOUS

## 2017-04-22 MED ORDER — IOPAMIDOL (ISOVUE-300) INJECTION 61%
INTRAVENOUS | Status: AC
  Filled 2017-04-22: qty 100

## 2017-04-22 MED ORDER — MORPHINE SULFATE (PF) 4 MG/ML IV SOLN
4.0000 mg | Freq: Once | INTRAVENOUS | Status: AC
  Administered 2017-04-22: 4 mg via INTRAVENOUS
  Filled 2017-04-22: qty 1

## 2017-04-22 NOTE — ED Triage Notes (Signed)
Pt BIB EMS from friend's home. Went to dialysis earlier today and became nauseous during procedure. Did NOT complete full treatment and was sent home. Reports having fatigue, abdominal distention, and abdominal "heaviness" for the past week. Per EMS, pt reports mild SOB, chest pain with deep inspiration. Wheezing noted & given neb treatment. Given 5mg  albuterol, 4mg  zofran, 324mg  ASA by EMS.

## 2017-04-22 NOTE — ED Provider Notes (Signed)
[facility] DEPT Provider Note   CSN: [id number] Arrival date & time: 04/22/17  1908     History   Chief Complaint Chief Complaint  Patient presents with  . Nausea  . Abdominal Pain  . Fatigue    HPI [name] is a 56 y.o. male.  HPI Patient presents emergency department with nausea and increasing abdominal swelling and abdominal heaviness over the past week.  He has a known history of cirrhosis as well as end-stage renal disease.  He is unable to have a complete course of dialysis today secondary to severe nausea.  He has increasing fatigue as well as.  He states that he is hungry was been unable to eat secondary to nausea and increased abdominal swelling.  No shortness of breath.   Past Medical History:  Diagnosis Date  . Adenomatous colon polyp    tubular  . Anemia   . Anxiety   . Arthritis    left shoulder  . Atherosclerosis of aorta ([place])   . Cardiomegaly   . Chest pain    DATE UNKNOWN, C/O PERIODICALLY  . Chronic kidney disease    dialysis - M/W/F  . Cocaine abuse   . COPD exacerbation ([place]) 08/17/2016  . Dialysis patient ([place])    Monday-Wednesday-Friday  . GERD (gastroesophageal reflux disease)    DATE UNKNOWN  . Hemorrhoids   . Hepatitis B, chronic ([place])   . Hepatitis C   . Hyperkalemia   . Hypertension   . Metabolic bone disease    Patient denies  . Nephrolithiasis   . Pneumonia   . Pulmonary edema   . Renal disorder   . Renal insufficiency   . Shortness of breath dyspnea    " for the last past year with this dialysis"  . Tubular adenoma of colon     Patient Active Problem List   Diagnosis Date Noted  . Acute on chronic respiratory failure with hypoxia ([place]) 03/25/2017  . Atrial flutter ([place]) 03/25/2017  . Arrhythmia 03/25/2017  . COPD GOLD II/ still smoking 09/27/2016  . Essential hypertension 09/27/2016  . Fluid overload 08/30/2016  . COPD exacerbation ([place]) 08/17/2016  . Hypertensive urgency 08/17/2016  . Respiratory failure  ([place]) 08/17/2016  . Problem with dialysis access ([place]) 07/23/2016  . End stage renal disease ([place]) 12/02/2015  . Chronic hepatitis B ([place]) 03/05/2014  . Chronic hepatitis C without hepatic coma ([place]) 03/05/2014  . Internal hemorrhoids with bleeding, swelling and itching 03/05/2014  . Thrombocytopenia ([place]) 03/05/2014  . Chest pain 02/27/2014  . Alcohol abuse 04/14/2009  . Cigarette smoker 04/14/2009  . GANGLION CYST 04/14/2009    Past Surgical History:  Procedure Laterality Date  . AV FISTULA PLACEMENT  2012   BELIEVED WAS PLACED IN JUNE  . COLONOSCOPY    . CORONARY STENT INTERVENTION N/A 02/22/2017   Procedure: CORONARY STENT INTERVENTION;  Surgeon: Nigel Mormon, MD;  Location: [facility];  Service: Cardiovascular;  Laterality: N/A;  . HEMORRHOID BANDING    . LEFT HEART CATH AND CORONARY ANGIOGRAPHY N/A 02/22/2017   Procedure: LEFT HEART CATH AND CORONARY ANGIOGRAPHY;  Surgeon: Nigel Mormon, MD;  Location: [facility];  Service: Cardiovascular;  Laterality: N/A;  . LIGATION OF ARTERIOVENOUS  FISTULA Left 07/14/7515   Procedure: Plication of Left Arm Arteriovenous Fistula;  Surgeon: Elam Dutch, MD;  Location: [facility];  Service: Vascular;  Laterality: Left;  . POLYPECTOMY    . REVISON OF ARTERIOVENOUS FISTULA Left 01/20/2016  Procedure: PLICATION OF DISTAL ANEURYSMAL SEGEMENT OF LEFT UPPER ARM ARTERIOVENOUS FISTULA;  Surgeon: Elam Dutch, MD;  Location: [facility];  Service: Vascular;  Laterality: Left;  . REVISON OF ARTERIOVENOUS FISTULA Left 7/78/2423   Procedure: Plication of Left Upper Arm Fistula ;  Surgeon: Waynetta Sandy, MD;  Location: [facility] OR;  Service: Vascular;  Laterality: Left;       Home Medications    Prior to Admission medications   Medication Sig Start Date End Date Taking? Authorizing Provider  acetaminophen (TYLENOL) 500 MG tablet Take 500-1,000 mg by mouth every 6 (six) hours as needed (for headaches and pain).   Yes  [provider]  albuterol (PROVENTIL HFA;VENTOLIN HFA) 108 (90 Base) MCG/ACT inhaler Inhale 2 puffs into the lungs every 6 (six) hours as needed for wheezing or shortness of breath. 03/10/17  Yes Javier Glazier, MD  aspirin EC 81 MG tablet Take 81 mg by mouth daily.   Yes [provider]  budesonide-formoterol (SYMBICORT) 160-4.5 MCG/ACT inhaler Inhale 2 puffs into the lungs 2 (two) times daily.   Yes [provider]  calcitRIOL (ROCALTROL) 0.25 MCG capsule Take 3 capsules (0.75 mcg total) by mouth every Monday, Wednesday, and Friday with hemodialysis. 03/28/17  Yes Cherene Altes, MD  cinacalcet (SENSIPAR) 30 MG tablet Take 4 tablets (120 mg total) by mouth every Monday, Wednesday, and Friday with hemodialysis. 03/28/17  Yes Cherene Altes, MD  clopidogrel (PLAVIX) 75 MG tablet Take 1 tablet (75 mg total) by mouth daily. 02/24/17  Yes Velvet Bathe, MD  gabapentin (NEURONTIN) 100 MG capsule Take 100 mg by mouth up to two times a day 01/06/17  Yes [provider]  metoprolol succinate (TOPROL-XL) 25 MG 24 hr tablet Take 25 mg by mouth daily. 02/23/17  Yes [provider]  minoxidil (LONITEN) 10 MG tablet Take 10 mg by mouth 2 (two) times daily. 03/03/17  Yes [provider]  oxyCODONE (OXY IR/ROXICODONE) 5 MG immediate release tablet Take 5 mg by mouth 2 (two) times daily as needed for pain. 02/03/17  Yes [provider]  polyethylene glycol powder (GLYCOLAX/MIRALAX) powder Take 17grams (1 capful) dissolved in at least 8 ounces of water or juice, once daily Patient taking differently: Take 0.5 Containers by mouth daily as needed for mild constipation.  01/13/17  Yes Pyrtle, Lajuan Lines, MD  sevelamer carbonate (RENVELA) 800 MG tablet Take 3 tablets (2,400 mg total) by mouth 3 (three) times daily with meals. 03/26/17  Yes Cherene Altes, MD  Spacer/Aero-Holding Chambers (AEROCHAMBER MV) inhaler Use as instructed 03/10/17  Yes Javier Glazier, MD    Family History Family History  Problem Relation Age of Onset  . Heart disease Mother   . Lung cancer Mother   . Heart disease Father   . Malignant hyperthermia Father   . COPD Father   . Throat cancer Sister   . Esophageal cancer Sister   . Hypertension Other   . COPD Other   . Colon cancer Neg Hx   . Colon polyps Neg Hx   . Rectal cancer Neg Hx   . Stomach cancer Neg Hx     Social History Social History  Substance Use Topics  . Smoking status: Current Every Day Smoker    Packs/day: 0.50    Years: 43.00    Types: Cigarettes    Start date: 08/13/1973  . Smokeless tobacco: Never Used     Comment: Peak rate of 1ppd  . Alcohol  use 2.4 oz/week    1 Glasses of wine, 1 Cans of beer, 1 Shots of liquor, 1 Standard drinks or equivalent per week     Comment: occasional      Allergies   Aspirin; Clonidine derivatives; and Tramadol   Review of Systems Review of Systems  All other systems reviewed and are negative.    Physical Exam Updated Vital Signs BP (!) 169/97   Pulse 84   Resp 19   SpO2 97%   Physical Exam  Constitutional: He is oriented to person, place, and time. He appears well-developed and well-nourished.  HENT:  Head: Normocephalic and atraumatic.  Eyes: EOM are normal.  Neck: Normal range of motion.  Cardiovascular: Normal rate, regular rhythm, normal heart sounds and intact distal pulses.   Pulmonary/Chest: Effort normal and breath sounds normal. No respiratory distress.  Abdominal: Soft. He exhibits distension. There is no tenderness.  Ascites present on exam  Musculoskeletal: Normal range of motion.  Neurological: He is alert and oriented to person, place, and time.  Skin: Skin is warm and dry.  Psychiatric: He has a normal mood and affect. Judgment normal.  Nursing note and vitals reviewed.    ED Treatments / Results  Labs (all labs ordered are listed, but only abnormal results are displayed) Labs Reviewed  COMPREHENSIVE METABOLIC  PANEL - Abnormal; Notable for the following:       Result Value   Sodium 132 (*)    Potassium 5.3 (*)    Chloride 91 (*)    Glucose, Bld 112 (*)    Creatinine, Ser 6.10 (*)    Calcium 7.8 (*)    ALT 10 (*)    GFR calc non Af Amer 9 (*)    GFR calc Af Amer 11 (*)    All other components within normal limits  CBC - Abnormal; Notable for the following:    RBC 3.53 (*)    Hemoglobin 9.8 (*)    HCT 29.3 (*)    RDW 17.2 (*)    Platelets 57 (*)    All other components within normal limits  LIPASE, BLOOD  I-STAT TROPONIN, ED    EKG  EKG Interpretation  Date/Time:  Friday April 22 2017 19:10:46 EDT Ventricular Rate:  89 PR Interval:  206 QRS Duration: 166 QT Interval:  470 QTC Calculation: 571 R Axis:   150 Text Interpretation:  Normal sinus rhythm Right axis deviation Left bundle branch block Abnormal ECG No significant change was found Confirmed by Jola Schmidt [phone]) on 04/22/2017 8:36:00 PM       Radiology Dg Chest 2 View  Result Date: 04/22/2017 CLINICAL DATA:  Nausea during dialysis today. Now with abdominal distension and mild dyspnea. EXAM: CHEST  2 VIEW COMPARISON:  03/24/2017 FINDINGS: Moderate enlargement of the cardiac silhouette, unchanged. Mild vascular and interstitial prominence, without focal airspace consolidation. No effusions. Sclerosis of multiple bones consistent with chronic renal disease. IMPRESSION: Mild vascular and interstitial prominence. Unchanged cardiomegaly. No consolidation or effusion. Electronically Signed   By: Andreas Newport M.D.   On: 04/22/2017 20:46   Ct Abdomen Pelvis W Contrast  Result Date: 04/22/2017 CLINICAL DATA:  Abdominal distension, fatigue and nausea. EXAM: CT ABDOMEN AND PELVIS WITH CONTRAST TECHNIQUE: Multidetector CT imaging of the abdomen and pelvis was performed using the standard protocol following bolus administration of intravenous contrast. CONTRAST:  129mL ISOVUE-300 IOPAMIDOL (ISOVUE-300) INJECTION 61%  COMPARISON:  10/08/2015 FINDINGS: Lower chest: Small pericardial effusion measuring 14 mm in greatest thickness. Enlarged heart.  Calcific atherosclerotic disease of the coronary arteries. Hepatobiliary: No focal liver abnormality is seen. The gallbladder is decompressed and therefore poorly evaluated. Pancreas: Unremarkable. No pancreatic ductal dilatation or surrounding inflammatory changes. Spleen: Normal in size without focal abnormality. Adrenals/Urinary Tract: Atrophic kidneys. Normal appearance of the adrenal glands. Normal appearance of the urinary bladder. Stomach/Bowel: Stomach is within normal limits. No evidence of bowel wall thickening, distention, or inflammatory changes. Vascular/Lymphatic: Aortic atherosclerosis. No enlarged abdominal or pelvic lymph nodes. Reproductive: Prostate is unremarkable. Other: Large volume abdominopelvic ascites. Musculoskeletal: Stable findings of renal osteodystrophy. L5-S1 osteoarthritis. IMPRESSION: Large volume abdominopelvic ascites. Small pericardial effusion. Atrophic kidneys. Calcific atherosclerotic disease of the aorta and its abdominal branches. Electronically Signed   By: Fidela Salisbury M.D.   On: 04/22/2017 23:25    Procedures Procedures (including critical care time)  Medications Ordered in ED Medications  ondansetron (ZOFRAN) injection 4 mg (not administered)  metoCLOPramide (REGLAN) injection 10 mg (not administered)  morphine 4 MG/ML injection 4 mg (not administered)  iopamidol (ISOVUE-300) 61 % injection 100 mL (100 mLs Intravenous Contrast Given 04/22/17 2251)     Initial Impression / Assessment and Plan / ED Course  I have reviewed the triage vital signs and the nursing notes.  Pertinent labs & imaging results that were available during my care of the patient were reviewed by me and considered in my medical decision making (see chart for details).     Large volume abdominal ascites.  Patient has never had a paracentesis to this  point.  He likely is becoming symptomatic will benefit from observational stay overnight with therapeutic paracentesis in a.m.  Final Clinical Impressions(s) / ED Diagnoses   Final diagnoses:  Other ascites  Nausea    New Prescriptions New Prescriptions   No medications on file     Jola Schmidt, MD 04/22/17 2342

## 2017-04-22 NOTE — ED Notes (Signed)
Patient transported to X-ray 

## 2017-04-23 ENCOUNTER — Inpatient Hospital Stay (HOSPITAL_COMMUNITY): Payer: Medicare Other

## 2017-04-23 ENCOUNTER — Encounter (HOSPITAL_COMMUNITY): Payer: Self-pay | Admitting: Internal Medicine

## 2017-04-23 DIAGNOSIS — I1311 Hypertensive heart and chronic kidney disease without heart failure, with stage 5 chronic kidney disease, or end stage renal disease: Secondary | ICD-10-CM | POA: Diagnosis present

## 2017-04-23 DIAGNOSIS — B182 Chronic viral hepatitis C: Secondary | ICD-10-CM | POA: Diagnosis not present

## 2017-04-23 DIAGNOSIS — I120 Hypertensive chronic kidney disease with stage 5 chronic kidney disease or end stage renal disease: Secondary | ICD-10-CM | POA: Diagnosis not present

## 2017-04-23 DIAGNOSIS — I248 Other forms of acute ischemic heart disease: Secondary | ICD-10-CM | POA: Diagnosis present

## 2017-04-23 DIAGNOSIS — D631 Anemia in chronic kidney disease: Secondary | ICD-10-CM

## 2017-04-23 DIAGNOSIS — K7031 Alcoholic cirrhosis of liver with ascites: Secondary | ICD-10-CM

## 2017-04-23 DIAGNOSIS — I447 Left bundle-branch block, unspecified: Secondary | ICD-10-CM | POA: Diagnosis present

## 2017-04-23 DIAGNOSIS — D649 Anemia, unspecified: Secondary | ICD-10-CM | POA: Diagnosis present

## 2017-04-23 DIAGNOSIS — I10 Essential (primary) hypertension: Secondary | ICD-10-CM

## 2017-04-23 DIAGNOSIS — B181 Chronic viral hepatitis B without delta-agent: Secondary | ICD-10-CM | POA: Diagnosis present

## 2017-04-23 DIAGNOSIS — Z992 Dependence on renal dialysis: Secondary | ICD-10-CM | POA: Diagnosis not present

## 2017-04-23 DIAGNOSIS — I2510 Atherosclerotic heart disease of native coronary artery without angina pectoris: Secondary | ICD-10-CM | POA: Diagnosis present

## 2017-04-23 DIAGNOSIS — J810 Acute pulmonary edema: Secondary | ICD-10-CM | POA: Diagnosis present

## 2017-04-23 DIAGNOSIS — R110 Nausea: Secondary | ICD-10-CM | POA: Diagnosis present

## 2017-04-23 DIAGNOSIS — Z9115 Patient's noncompliance with renal dialysis: Secondary | ICD-10-CM | POA: Diagnosis not present

## 2017-04-23 DIAGNOSIS — I4892 Unspecified atrial flutter: Secondary | ICD-10-CM | POA: Diagnosis not present

## 2017-04-23 DIAGNOSIS — R0602 Shortness of breath: Secondary | ICD-10-CM | POA: Diagnosis not present

## 2017-04-23 DIAGNOSIS — E871 Hypo-osmolality and hyponatremia: Secondary | ICD-10-CM | POA: Diagnosis present

## 2017-04-23 DIAGNOSIS — J449 Chronic obstructive pulmonary disease, unspecified: Secondary | ICD-10-CM | POA: Diagnosis present

## 2017-04-23 DIAGNOSIS — I313 Pericardial effusion (noninflammatory): Secondary | ICD-10-CM | POA: Diagnosis present

## 2017-04-23 DIAGNOSIS — J410 Simple chronic bronchitis: Secondary | ICD-10-CM | POA: Diagnosis not present

## 2017-04-23 DIAGNOSIS — Z955 Presence of coronary angioplasty implant and graft: Secondary | ICD-10-CM | POA: Diagnosis not present

## 2017-04-23 DIAGNOSIS — G9341 Metabolic encephalopathy: Secondary | ICD-10-CM | POA: Diagnosis not present

## 2017-04-23 DIAGNOSIS — Z9114 Patient's other noncompliance with medication regimen: Secondary | ICD-10-CM | POA: Diagnosis not present

## 2017-04-23 DIAGNOSIS — J9621 Acute and chronic respiratory failure with hypoxia: Secondary | ICD-10-CM | POA: Diagnosis not present

## 2017-04-23 DIAGNOSIS — R188 Other ascites: Secondary | ICD-10-CM | POA: Diagnosis not present

## 2017-04-23 DIAGNOSIS — N2581 Secondary hyperparathyroidism of renal origin: Secondary | ICD-10-CM | POA: Diagnosis not present

## 2017-04-23 DIAGNOSIS — I480 Paroxysmal atrial fibrillation: Secondary | ICD-10-CM | POA: Diagnosis present

## 2017-04-23 DIAGNOSIS — N186 End stage renal disease: Secondary | ICD-10-CM | POA: Diagnosis not present

## 2017-04-23 DIAGNOSIS — D696 Thrombocytopenia, unspecified: Secondary | ICD-10-CM | POA: Diagnosis not present

## 2017-04-23 DIAGNOSIS — F1010 Alcohol abuse, uncomplicated: Secondary | ICD-10-CM | POA: Diagnosis present

## 2017-04-23 DIAGNOSIS — J9601 Acute respiratory failure with hypoxia: Secondary | ICD-10-CM | POA: Diagnosis present

## 2017-04-23 LAB — RENAL FUNCTION PANEL
Albumin: 3.8 g/dL (ref 3.5–5.0)
Anion gap: 13 (ref 5–15)
BUN: 10 mg/dL (ref 6–20)
CO2: 28 mmol/L (ref 22–32)
Calcium: 7.6 mg/dL — ABNORMAL LOW (ref 8.9–10.3)
Chloride: 91 mmol/L — ABNORMAL LOW (ref 101–111)
Creatinine, Ser: 6.62 mg/dL — ABNORMAL HIGH (ref 0.61–1.24)
GFR calc Af Amer: 10 mL/min — ABNORMAL LOW (ref >60)
GFR calc non Af Amer: 8 mL/min — ABNORMAL LOW (ref >60)
Glucose, Bld: 88 mg/dL (ref 65–99)
Phosphorus: 3.1 mg/dL (ref 2.5–4.6)
Potassium: 5.4 mmol/L — ABNORMAL HIGH (ref 3.5–5.1)
Sodium: 132 mmol/L — ABNORMAL LOW (ref 135–145)

## 2017-04-23 LAB — CBC
HCT: 30.2 % — ABNORMAL LOW (ref 39.0–52.0)
HCT: 29.2 % — ABNORMAL LOW (ref 39.0–52.0)
Hemoglobin: 9.6 g/dL — ABNORMAL LOW (ref 13.0–17.0)
Hemoglobin: 9.6 g/dL — ABNORMAL LOW (ref 13.0–17.0)
MCH: 27.5 pg (ref 26.0–34.0)
MCH: 27.5 pg (ref 26.0–34.0)
MCHC: 31.8 g/dL (ref 30.0–36.0)
MCHC: 32.9 g/dL (ref 30.0–36.0)
MCV: 86.5 fL (ref 78.0–100.0)
MCV: 83.7 fL (ref 78.0–100.0)
Platelets: 58 10*3/uL — ABNORMAL LOW (ref 150–400)
Platelets: 56 10*3/uL — ABNORMAL LOW (ref 150–400)
RBC: 3.49 MIL/uL — ABNORMAL LOW (ref 4.22–5.81)
RBC: 3.49 MIL/uL — ABNORMAL LOW (ref 4.22–5.81)
RDW: 18.9 % — ABNORMAL HIGH (ref 11.5–15.5)
RDW: 17.2 % — ABNORMAL HIGH (ref 11.5–15.5)
WBC: 8.1 10*3/uL (ref 4.0–10.5)
WBC: 7.4 10*3/uL (ref 4.0–10.5)

## 2017-04-23 LAB — TSH: TSH: 2.173 u[IU]/mL (ref 0.350–4.500)

## 2017-04-23 LAB — COMPREHENSIVE METABOLIC PANEL
ALT: 9 U/L — ABNORMAL LOW (ref 17–63)
AST: 22 U/L (ref 15–41)
Albumin: 3.9 g/dL (ref 3.5–5.0)
Alkaline Phosphatase: 103 U/L (ref 38–126)
Anion gap: 16 — ABNORMAL HIGH (ref 5–15)
BUN: 12 mg/dL (ref 6–20)
CO2: 24 mmol/L (ref 22–32)
Calcium: 7.3 mg/dL — ABNORMAL LOW (ref 8.9–10.3)
Chloride: 91 mmol/L — ABNORMAL LOW (ref 101–111)
Creatinine, Ser: 6.81 mg/dL — ABNORMAL HIGH (ref 0.61–1.24)
GFR calc Af Amer: 10 mL/min — ABNORMAL LOW (ref >60)
GFR calc non Af Amer: 8 mL/min — ABNORMAL LOW (ref >60)
Glucose, Bld: 72 mg/dL (ref 65–99)
Potassium: 5.6 mmol/L — ABNORMAL HIGH (ref 3.5–5.1)
Sodium: 131 mmol/L — ABNORMAL LOW (ref 135–145)
Total Bilirubin: 1.2 mg/dL (ref 0.3–1.2)
Total Protein: 7.1 g/dL (ref 6.5–8.1)

## 2017-04-23 LAB — MRSA PCR SCREENING: MRSA by PCR: NEGATIVE

## 2017-04-23 LAB — PHOSPHORUS: Phosphorus: 3.2 mg/dL (ref 2.5–4.6)

## 2017-04-23 LAB — MAGNESIUM: Magnesium: 2.2 mg/dL (ref 1.7–2.4)

## 2017-04-23 LAB — TROPONIN I
Troponin I: 0.03 ng/mL (ref <0.03)
Troponin I: 0.04 ng/mL (ref <0.03)

## 2017-04-23 MED ORDER — METOPROLOL SUCCINATE ER 25 MG PO TB24: 25.0000 mg | ORAL_TABLET | Freq: Every day | ORAL | Status: DC

## 2017-04-23 MED ORDER — SODIUM CHLORIDE 0.9% FLUSH: 3.0000 mL | INTRAVENOUS | Status: DC | PRN

## 2017-04-23 MED ORDER — CALCITRIOL 0.5 MCG PO CAPS: 0.7500 ug | ORAL_CAPSULE | ORAL | Status: DC

## 2017-04-23 MED ORDER — POLYETHYLENE GLYCOL 3350 17 G PO PACK
17.0000 g | PACK | Freq: Every day | ORAL | Status: DC
  Administered 2017-04-23: 17 g via ORAL
  Filled 2017-04-23: qty 1

## 2017-04-23 MED ORDER — OXYCODONE HCL 5 MG PO TABS
5.0000 mg | ORAL_TABLET | Freq: Two times a day (BID) | ORAL | Status: DC | PRN
  Administered 2017-04-25: 5 mg via ORAL
  Filled 2017-04-23: qty 1

## 2017-04-23 MED ORDER — ALBUTEROL SULFATE (2.5 MG/3ML) 0.083% IN NEBU
INHALATION_SOLUTION | RESPIRATORY_TRACT | Status: AC
  Administered 2017-04-23: 2.5 mg
  Filled 2017-04-23: qty 3

## 2017-04-23 MED ORDER — CINACALCET HCL 30 MG PO TABS: 120.0000 mg | ORAL_TABLET | ORAL | Status: DC

## 2017-04-23 MED ORDER — TRAZODONE HCL 50 MG PO TABS
50.0000 mg | ORAL_TABLET | Freq: Every evening | ORAL | Status: DC | PRN
  Administered 2017-04-23 – 2017-04-24 (×2): 50 mg via ORAL
  Filled 2017-04-23 (×2): qty 1

## 2017-04-23 MED ORDER — IPRATROPIUM-ALBUTEROL 0.5-2.5 (3) MG/3ML IN SOLN: 3.0000 mL | Freq: Four times a day (QID) | RESPIRATORY_TRACT | Status: DC | PRN

## 2017-04-23 MED ORDER — ONDANSETRON HCL 4 MG PO TABS: 4.0000 mg | ORAL_TABLET | Freq: Four times a day (QID) | ORAL | Status: DC | PRN

## 2017-04-23 MED ORDER — CALCITRIOL 0.5 MCG PO CAPS
1.0000 ug | ORAL_CAPSULE | ORAL | Status: DC
  Administered 2017-04-25: 1 ug via ORAL

## 2017-04-23 MED ORDER — LIDOCAINE HCL (PF) 1 % IJ SOLN
INTRAMUSCULAR | Status: AC
  Filled 2017-04-23: qty 30

## 2017-04-23 MED ORDER — SODIUM CHLORIDE 0.9% FLUSH
3.0000 mL | Freq: Two times a day (BID) | INTRAVENOUS | Status: DC
  Administered 2017-04-23 – 2017-04-25 (×4): 3 mL via INTRAVENOUS

## 2017-04-23 MED ORDER — CINACALCET HCL 30 MG PO TABS: 60.0000 mg | ORAL_TABLET | ORAL | Status: DC

## 2017-04-23 MED ORDER — SODIUM CHLORIDE 0.9 % IV SOLN: 250.0000 mL | INTRAVENOUS | Status: DC | PRN

## 2017-04-23 MED ORDER — ASPIRIN EC 81 MG PO TBEC
81.0000 mg | DELAYED_RELEASE_TABLET | Freq: Every day | ORAL | Status: DC
  Administered 2017-04-23 – 2017-04-25 (×3): 81 mg via ORAL
  Filled 2017-04-23 (×3): qty 1

## 2017-04-23 MED ORDER — SODIUM CHLORIDE 0.9 % IV SOLN: 62.5000 mg | INTRAVENOUS | Status: DC

## 2017-04-23 MED ORDER — SEVELAMER CARBONATE 800 MG PO TABS
2400.0000 mg | ORAL_TABLET | Freq: Three times a day (TID) | ORAL | Status: DC
  Administered 2017-04-23 – 2017-04-25 (×4): 2400 mg via ORAL
  Filled 2017-04-23 (×4): qty 3

## 2017-04-23 MED ORDER — METOPROLOL SUCCINATE ER 25 MG PO TB24
25.0000 mg | ORAL_TABLET | Freq: Every day | ORAL | Status: DC
  Administered 2017-04-23 – 2017-04-24 (×2): 25 mg via ORAL
  Filled 2017-04-23 (×2): qty 1

## 2017-04-23 MED ORDER — CLOPIDOGREL BISULFATE 75 MG PO TABS
75.0000 mg | ORAL_TABLET | Freq: Every day | ORAL | Status: DC
  Administered 2017-04-23 – 2017-04-25 (×3): 75 mg via ORAL
  Filled 2017-04-23 (×3): qty 1

## 2017-04-23 MED ORDER — ONDANSETRON HCL 4 MG/2ML IJ SOLN: 4.0000 mg | Freq: Four times a day (QID) | INTRAMUSCULAR | Status: DC | PRN

## 2017-04-23 NOTE — Progress Notes (Signed)
    I came to do the echo. The patient requested his cardiologist, Dr. Einar Gip be contacted before proceeding. Dr. Einar Gip does not believe the patient needs an echo at this time.   Merrie Roof F 04/23/2017, 8:53 AM

## 2017-04-23 NOTE — Consult Note (Signed)
[facility] Renal Consultation Note    Indication for Consultation:  Management of ESRD/hemodialysis; anemia, hypertension/volume and secondary hyperparathyroidism Referring MD - [facility]  HPI: [name] is a 56 y.o. male with ESRD on MWF HD , hep B and C, polysubstance abuse, chronic thrombocytopenia, CAD with PCI of RCA 02/2017, EF 45-55% PAF with LBBB with cardioversion 03/24/17 (noncoumadin candidate due to noncompliance) calcified aoritic and mitral valve, poor medical and dialysis adherence who reportedly signed off early on Friday after 2 hr and  due to nausea. Post HD weight was 73.8. with net UF 0.9 .  Pre HD BP Friday were 158/92 P 88 and post BPs 212/122 sitting and 240/115 standing P 89  He went home and later presented to the ED with abdominal distension, "heaviness, mild SOB, CP with deep inspiration and wheezing. He was last seen at the outpatient HD unit Wednesday at the end of HD when he was leaving early and was more focused on the bleeding that he had had recently from his access and wanted referral back to [facility].  He feels like the abdominal distension has had a gradual onset.  He has early satiety but can drink coffee.  He doesn't feel like he has lost weight but at the same time states he can't eat.  He his hungry now and wants to eat.  He thinks his hunger is causing nausea. He make on a little urine.  He only takes his BP meds when he needs it which he determines by home BP cuff.  He is only taking minoxidil prn---not metoprolol which he last filled in August. He currently is without CP, SOB and is breathing easier than when admitted.  He does not have a follow up appointment with the "breathing doctor" and is not aware of the alpha 1 antitrypsin deficiency.  Repeat labs this am showed K 5.6 Na 131 Ca 7.3 P 3.2 alb 3.9 LFT ok torp 0.03 hgb 9.6 WBC 7.4 plts 56.Abdominal CT showed large volume abdominal/pelvic ascites, small pericardial effusion. CXR showed  unchanged CM, unchanged without effusions or consolidation.  Past Medical History:  Diagnosis Date  . Adenomatous colon polyp    tubular  . Anemia   . Anxiety   . Arthritis    left shoulder  . Atherosclerosis of aorta ([place])   . Cardiomegaly   . Chest pain    DATE UNKNOWN, C/O PERIODICALLY  . Chronic kidney disease    dialysis - M/W/F  . Cocaine abuse ([place])   . COPD exacerbation ([place]) 08/17/2016  . Dialysis patient ([place])    Monday-Wednesday-Friday  . GERD (gastroesophageal reflux disease)    DATE UNKNOWN  . Hemorrhoids   . Hepatitis B, chronic ([place])   . Hepatitis C   . Hyperkalemia   . Hypertension   . Metabolic bone disease    Patient denies  . Nephrolithiasis   . Pneumonia   . Pulmonary edema   . Renal disorder   . Renal insufficiency   . Shortness of breath dyspnea    " for the last past year with this dialysis"  . Tubular adenoma of colon    Past Surgical History:  Procedure Laterality Date  . AV FISTULA PLACEMENT  2012   BELIEVED WAS PLACED IN JUNE  . COLONOSCOPY    . CORONARY STENT INTERVENTION N/A 02/22/2017   Procedure: CORONARY STENT INTERVENTION;  Surgeon: Nigel Mormon, MD;  Location: [facility];  Service: Cardiovascular;  Laterality: N/A;  .  HEMORRHOID BANDING    . LEFT HEART CATH AND CORONARY ANGIOGRAPHY N/A 02/22/2017   Procedure: LEFT HEART CATH AND CORONARY ANGIOGRAPHY;  Surgeon: Nigel Mormon, MD;  Location: [facility];  Service: Cardiovascular;  Laterality: N/A;  . LIGATION OF ARTERIOVENOUS  FISTULA Left 0/03/6044   Procedure: Plication of Left Arm Arteriovenous Fistula;  Surgeon: Elam Dutch, MD;  Location: [facility];  Service: Vascular;  Laterality: Left;  . POLYPECTOMY    . REVISON OF ARTERIOVENOUS FISTULA Left 10/18/8117   Procedure: PLICATION OF DISTAL ANEURYSMAL SEGEMENT OF LEFT UPPER ARM ARTERIOVENOUS FISTULA;  Surgeon: Elam Dutch, MD;  Location: [facility];  Service: Vascular;  Laterality: Left;  . REVISON OF  ARTERIOVENOUS FISTULA Left 1/47/8295   Procedure: Plication of Left Upper Arm Fistula ;  Surgeon: Waynetta Sandy, MD;  Location[facility];  Service: Vascular;  Laterality: Left;   Family History  Problem Relation Age of Onset  . Heart disease Mother   . Lung cancer Mother   . Heart disease Father   . Malignant hyperthermia Father   . COPD Father   . Throat cancer Sister   . Esophageal cancer Sister   . Hypertension Other   . COPD Other   . Colon cancer Neg Hx   . Colon polyps Neg Hx   . Rectal cancer Neg Hx   . Stomach cancer Neg Hx    Social History:  reports that he has been smoking Cigarettes.  He started smoking about 43 years ago. He has a 21.50 pack-year smoking history. He has never used smokeless tobacco. He reports that he drinks about 2.4 oz of alcohol per week . He reports that he uses drugs, including Marijuana and Cocaine. Allergies  Allergen Reactions  . Aspirin Other (See Comments)    STOMACH PAIN  . Clonidine Derivatives Itching  . Tramadol Itching   Prior to Admission medications   Medication Sig Start Date End Date Taking? Authorizing Provider  acetaminophen (TYLENOL) 500 MG tablet Take 500-1,000 mg by mouth every 6 (six) hours as needed (for headaches and pain).   Yes [provider]  albuterol (PROVENTIL HFA;VENTOLIN HFA) 108 (90 Base) MCG/ACT inhaler Inhale 2 puffs into the lungs every 6 (six) hours as needed for wheezing or shortness of breath. 03/10/17  Yes Javier Glazier, MD  aspirin EC 81 MG tablet Take 81 mg by mouth daily.   Yes [provider]  budesonide-formoterol (SYMBICORT) 160-4.5 MCG/ACT inhaler Inhale 2 puffs into the lungs 2 (two) times daily.   Yes [provider]  calcitRIOL (ROCALTROL) 0.25 MCG capsule Take 3 capsules (0.75 mcg total) by mouth every Monday, Wednesday, and Friday with hemodialysis. 03/28/17  Yes Cherene Altes, MD  cinacalcet (SENSIPAR) 30 MG tablet Take 4 tablets (120 mg total) by mouth  every Monday, Wednesday, and Friday with hemodialysis. 03/28/17  Yes Cherene Altes, MD  clopidogrel (PLAVIX) 75 MG tablet Take 1 tablet (75 mg total) by mouth daily. 02/24/17  Yes Velvet Bathe, MD  gabapentin (NEURONTIN) 100 MG capsule Take 100 mg by mouth up to two times a day 01/06/17  Yes [provider]  metoprolol succinate (TOPROL-XL) 25 MG 24 hr tablet Take 25 mg by mouth daily. 02/23/17  Yes [provider]  minoxidil (LONITEN) 10 MG tablet Take 10 mg by mouth 2 (two) times daily. 03/03/17  Yes [provider]  oxyCODONE (OXY IR/ROXICODONE) 5 MG immediate release tablet Take 5 mg by mouth 2 (two)  times daily as needed for pain. 02/03/17  Yes [provider]  polyethylene glycol powder (GLYCOLAX/MIRALAX) powder Take 17grams (1 capful) dissolved in at least 8 ounces of water or juice, once daily Patient taking differently: Take 0.5 Containers by mouth daily as needed for mild constipation.  01/13/17  Yes Pyrtle, Lajuan Lines, MD  sevelamer carbonate (RENVELA) 800 MG tablet Take 3 tablets (2,400 mg total) by mouth 3 (three) times daily with meals. 03/26/17  Yes Cherene Altes, MD  Spacer/Aero-Holding Chambers (AEROCHAMBER MV) inhaler Use as instructed 03/10/17  Yes Javier Glazier, MD   Current Facility-Administered Medications  Medication Dose Route Frequency Provider Last Rate Last Dose  . 0.9 %  sodium chloride infusion  250 mL Intravenous PRN Toy Baker, MD      . aspirin EC tablet 81 mg  81 mg Oral Daily Doutova, Nyoka Lint, MD      . Derrill Memo ON 04/25/2017] calcitRIOL (ROCALTROL) capsule 1 mcg  1 mcg Oral Q M,W,F-HD Alric Seton, PA-C      . [START ON 04/25/2017] cinacalcet (SENSIPAR) tablet 120 mg  120 mg Oral Q M,W,F-HD Doutova, Anastassia, MD      . clopidogrel (PLAVIX) tablet 75 mg  75 mg Oral Daily Doutova, Anastassia, MD      . metoprolol succinate (TOPROL-XL) 24 hr tablet 25 mg  25 mg Oral Daily Doutova, Anastassia, MD      . ondansetron  (ZOFRAN) tablet 4 mg  4 mg Oral Q6H PRN Doutova, Anastassia, MD       Or  . ondansetron (ZOFRAN) injection 4 mg  4 mg Intravenous Q6H PRN Doutova, Anastassia, MD      . oxyCODONE (Oxy IR/ROXICODONE) immediate release tablet 5 mg  5 mg Oral BID PRN Doutova, Anastassia, MD      . sevelamer carbonate (RENVELA) tablet 2,400 mg  2,400 mg Oral TID WC Doutova, Anastassia, MD      . sodium chloride flush (NS) 0.9 % injection 3 mL  3 mL Intravenous Q12H Doutova, Anastassia, MD      . sodium chloride flush (NS) 0.9 % injection 3 mL  3 mL Intravenous PRN Toy Baker, MD       Facility-Administered Medications Ordered in Other Encounters  Medication Dose Route Frequency Provider Last Rate Last Dose  . iopamidol (ISOVUE-300) 61 % injection            Labs: Basic Metabolic Panel:  Recent Labs Lab 04/22/17 2002 04/23/17 0502  NA 132* 132*  K 5.3* 5.4*  CL 91* 91*  CO2 30 28  GLUCOSE 112* 88  BUN 9 10  CREATININE 6.10* 6.62*  CALCIUM 7.8* 7.6*  PHOS  --  3.1   Liver Function Tests:  Recent Labs Lab 04/22/17 2002 04/23/17 0502  AST 25  --   ALT 10*  --   ALKPHOS 117  --   BILITOT 1.1  --   PROT 7.2  --   ALBUMIN 4.0 3.8    Recent Labs Lab 04/22/17 2002  LIPASE 28   CBC:  Recent Labs Lab 04/22/17 2002 04/23/17 0413 04/23/17 0759  WBC 7.7 8.1 7.4  HGB 9.8* 9.6* 9.6*  HCT 29.3* 30.2* 29.2*  MCV 83.0 86.5 83.7  PLT 57* 58* 56*   Studies/Results: Dg Chest 2 View  Result Date: 04/22/2017 CLINICAL DATA:  Nausea during dialysis today. Now with abdominal distension and mild dyspnea. EXAM: CHEST  2 VIEW COMPARISON:  03/24/2017 FINDINGS: Moderate enlargement of the cardiac silhouette, unchanged. Mild vascular  and interstitial prominence, without focal airspace consolidation. No effusions. Sclerosis of multiple bones consistent with chronic renal disease. IMPRESSION: Mild vascular and interstitial prominence. Unchanged cardiomegaly. No consolidation or effusion.  Electronically Signed   By: Andreas Newport M.D.   On: 04/22/2017 20:46   Ct Abdomen Pelvis W Contrast  Result Date: 04/22/2017 CLINICAL DATA:  Abdominal distension, fatigue and nausea. EXAM: CT ABDOMEN AND PELVIS WITH CONTRAST TECHNIQUE: Multidetector CT imaging of the abdomen and pelvis was performed using the standard protocol following bolus administration of intravenous contrast. CONTRAST:  179mL ISOVUE-300 IOPAMIDOL (ISOVUE-300) INJECTION 61% COMPARISON:  10/08/2015 FINDINGS: Lower chest: Small pericardial effusion measuring 14 mm in greatest thickness. Enlarged heart. Calcific atherosclerotic disease of the coronary arteries. Hepatobiliary: No focal liver abnormality is seen. The gallbladder is decompressed and therefore poorly evaluated. Pancreas: Unremarkable. No pancreatic ductal dilatation or surrounding inflammatory changes. Spleen: Normal in size without focal abnormality. Adrenals/Urinary Tract: Atrophic kidneys. Normal appearance of the adrenal glands. Normal appearance of the urinary bladder. Stomach/Bowel: Stomach is within normal limits. No evidence of bowel wall thickening, distention, or inflammatory changes. Vascular/Lymphatic: Aortic atherosclerosis. No enlarged abdominal or pelvic lymph nodes. Reproductive: Prostate is unremarkable. Other: Large volume abdominopelvic ascites. Musculoskeletal: Stable findings of renal osteodystrophy. L5-S1 osteoarthritis. IMPRESSION: Large volume abdominopelvic ascites. Small pericardial effusion. Atrophic kidneys. Calcific atherosclerotic disease of the aorta and its abdominal branches. Electronically Signed   By: Fidela Salisbury M.D.   On: 04/22/2017 23:25    ROS: As per HPI otherwise negative.  Physical Exam: Vitals:   04/23/17 0300 04/23/17 0447 04/23/17 0700 04/23/17 0953  BP:  (!) 166/95  (!) 161/97  Pulse: 87 88  84  Resp: 14 18  17   Temp:  97.6 F (36.4 C)  98.2 F (36.8 C)  TempSrc:  Oral  Oral  SpO2: 94% 96%  100%  Weight:   73.8 kg (162 lb 9.6 oz)    Height:   5\' 9"  (1.753 m)      General: slender AAM NAD supine in bed Head: NCAT sclera not icteric MMM Neck: Supple.  Lungs: dry crackles at bases; no wheezes. Heart: RRR no rub 2/6 murmur Abdomen: marked distension + fluid wave + BS  Lower extremities:without edema or ischemic changes, no open wounds  Neuro: A & O  X 3. Moves all extremities spontaneously. Psych:  Responds to questions appropriately with usual affect. Dialysis Access: left AVF tortuous with aneurysms + bruit radiates to heart  Dialysis Orders:  MWF [facility] 4 hr EDW 71.5 2 K 2 Ca left AVF heparin 3000 Mircera 150 last 10/3 sensipar 120 calcitriol 1 - hep B + needs isolation HD  Recent labs: hgb 9.5 21% s/p 5 doses venofer recently ferritin 1008 7/25  sat iPTH 741 Ca 8.6 P 2.9  Outpatient BP med fill history MTP 8/15 filled #30 MTP  and minoxidil 10 9/20  Assessment/Plan: 1. Ascites - ? Secondary to cardiac cirrhosis/underdialysis- seems to be gradual onset- for diagnostic paracentesis today 2. ESRD -  MWF - needs EDW lowered at least after paracentesis; he has been resistant to this in the past - for paracentesis today- do extra dialysis for mild hyperkalemia  - left 2.3 kg above EDW Friday - back on schedule Monday; NOW REFUSING HD TODAY - check labs in am; told him if K up will need to take kayexalate or veltassa as we will not do HD on Sunday 3. Hypertension/volume  - started on midoxidil 10 bid after last admission - filled 9/20  which he takes prn based on when HE thinks his BP is high AND he is not taking MTP 25 XL - BP were well controlled in the hospital but not in the outpatient setting because he does not take as Rx.  Changed MTP to bedtime. Reconsider midoxidil - high risk for pericardial effusion especially in an underdialyzed patient; it definitely should not be taken as a sole medicine.s/p Echo this am pending 4. Anemia  - Not due for ESA- trend hgb- continue weekly Fe 5. Metabolic bone  disease -  Continue /calictriol/binders - I think Ca is low because of the high dose sensipar - have reduced to 60 for now;  Today will run on 3.5 Ca bath for 1.5 hr then change to 2.5 Ca bath; resume higher dose sensipar as able; PT NOW REFUSING HD today - so re-Ca bath evaluate orders tomorrow for Monday  6. Nutrition - NPO - advance to renal diet as able; I expect he has unintentional weight loss - though he does not thinks so  7. Access issues - recent prolonged bleeding- his HD unit was asked to schedule f/u appointment with [facility]; if any issues while here, can see while here  8. COPD with alpha 1 antitrypsin deficiency - needs f/u appointment with pulmonary after d/c; no SOB now - on room air breathing easily 9. Cirrhosis - s/p treatment for Hep C; Hep B + needs isolation HD 10. Chronic thrombocytopenia - stable 11. Poor dialysis and medical adherence - is he a candidate for close [facility] monitoring?? 12. Hx polysubstance abuse/lives alone  Myriam Jacobson, PA-C [place] [phone] 04/23/2017, 9:54 AM    Pt seen, examined and agree w A/P as above.  [name] MD [employer] pager [phone]   04/23/2017, 2:07 PM

## 2017-04-23 NOTE — H&P (Signed)
[name] RKY:[id number] DOB: 12/11/1960 DOA: 04/22/2017     PCP: [name], MD   Outpatient Specialists: [facility]  Patient coming from:  home Lives alone,     Chief Complaint: Fatigue nausea abdominal distention  HPI: [name] is a 56 y.o. male with medical history significant of hepatitis B  alcohol abuse, ESRD, COPD, tobacco abuse, prior history of cocaine abuse, GERD, coronary artery disease status post stent placement on 02/22/2017 to proximal RCA, history of A. Flutter with aberrancy  status post cardioversion 03/24/2017, A flutter induced CVA, Alpha -1 antitrypsin deficiency  Presented with nausea about in dialysis earlier today did not complete that treatment and went home has been feeling somewhat tired and have had abdominal distention and heaviness feels that fluid is shifting in his abdomen and he moves some associated shortness of breath but no chest pain initially have had some wheezing and was given nebulizer treatment by EMS he was given 5 mg of albuterol and 324 of aspirin Had some nausea and increased early  Satiety. reports non compliance with Plavix says no sure he has been taking it reports mild chest pain earlier today. Lasting just a few min,.    Regarding pertinent Chronic problems: Complex cardiac history in August patient undergone DES to right coronary artery currently on Plavix in September patient was found to have a flutter with appearance was cardioverted to sinus rhythm on last echogram showed EF of 40-45 percent with diffuse hypokinesis Parox Afib w/ LBBB Initially patient was started on warfarin but has not been compliant patient refused heparin in the past while hospitalized eventually Coumadin was discontinued due to noncompliance Known history of Alpha -1 antitrypsin deficiency tested as an outpt by Pulmonary  Hx of ESRD on HD M/W/F patient in the  Past has   refused dialysis on occasions His chronic thrombocytopenia  felt to be secondary to alcohol abuse and liver disease.  Hep C states he has been treated   IN ER:  No data recorded.      on arrival  ED Triage Vitals  Enc Vitals Group     BP 04/22/17 1945 (!) 169/98     Pulse Rate 04/22/17 1945 88     Resp 04/22/17 1945 20     Temp --      Temp src --      SpO2 04/22/17 1912 99 %     Weight --      Height --      Head Circumference --      Peak Flow --      Pain Score 04/22/17 1943 8     Pain Loc --      Pain Edu? --      Excl. in [place]? --     Latest RR 17 98% HR 85 BP 173/110 Trop 0.02 Lipase 28 Na 132 K 5.3 BUN 9 Cr 6.10 alb 4.0 total bili 1.1 WBC 7.7 HG 9.8 PLT 57 down from 132  CT abd large ascites, small pericardial effusion Following Medications were ordered in ER: Medications  ondansetron (ZOFRAN) injection 4 mg (4 mg Intravenous Given 04/22/17 2350)  metoCLOPramide (REGLAN) injection 10 mg (10 mg Intravenous Given 04/22/17 2352)  morphine 4 MG/ML injection 4 mg (4 mg Intravenous Given 04/22/17 2357)  iopamidol (ISOVUE-300) 61 % injection 100 mL (100 mLs Intravenous Contrast Given 04/22/17 2251)     Hospitalist was called for admission for Symptomatic abdominal ascites  Review of Systems:  Pertinent positives include: fatigue,  Nausea, abdominal distention  Constitutional:  No weight loss, night sweats, Fevers, chills,  weight loss  HEENT:  No headaches, Difficulty swallowing,Tooth/dental problems,Sore throat,  No sneezing, itching, ear ache, nasal congestion, post nasal drip,  Cardio-vascular:  No chest pain, Orthopnea, PND, anasarca, dizziness, palpitations.no Bilateral lower extremity swelling  GI:  No heartburn, indigestion, abdominal pain, vomiting, diarrhea, change in bowel habits, loss of appetite, melena, blood in stool, hematemesis Resp:  no shortness of breath at rest. No dyspnea on exertion, No excess mucus, no productive cough, No non-productive cough, No coughing up of blood.No change in color of  mucus.No wheezing. Skin:  no rash or lesions. No jaundice GU:  no dysuria, change in color of urine, no urgency or frequency. No straining to urinate.  No flank pain.  Musculoskeletal:  No joint pain or no joint swelling. No decreased range of motion. No back pain.  Psych:  No change in mood or affect. No depression or anxiety. No memory loss.  Neuro: no localizing neurological complaints, no tingling, no weakness, no double vision, no gait abnormality, no slurred speech, no confusion  As per HPI otherwise 10 point review of systems negative.   Past Medical History: Past Medical History:  Diagnosis Date  . Adenomatous colon polyp    tubular  . Anemia   . Anxiety   . Arthritis    left shoulder  . Atherosclerosis of aorta ([place])   . Cardiomegaly   . Chest pain    DATE UNKNOWN, C/O PERIODICALLY  . Chronic kidney disease    dialysis - M/W/F  . Cocaine abuse ([place])   . COPD exacerbation ([place]) 08/17/2016  . Dialysis patient ([place])    Monday-Wednesday-Friday  . GERD (gastroesophageal reflux disease)    DATE UNKNOWN  . Hemorrhoids   . Hepatitis B, chronic ([place])   . Hepatitis C   . Hyperkalemia   . Hypertension   . Metabolic bone disease    Patient denies  . Nephrolithiasis   . Pneumonia   . Pulmonary edema   . Renal disorder   . Renal insufficiency   . Shortness of breath dyspnea    " for the last past year with this dialysis"  . Tubular adenoma of colon    Past Surgical History:  Procedure Laterality Date  . AV FISTULA PLACEMENT  2012   BELIEVED WAS PLACED IN JUNE  . COLONOSCOPY    . CORONARY STENT INTERVENTION N/A 02/22/2017   Procedure: CORONARY STENT INTERVENTION;  Surgeon: Nigel Mormon, MD;  Location: [place] [facility];  Service: Cardiovascular;  Laterality: N/A;  . HEMORRHOID BANDING    . LEFT HEART CATH AND CORONARY ANGIOGRAPHY N/A 02/22/2017   Procedure: LEFT HEART CATH AND CORONARY ANGIOGRAPHY;  Surgeon: Nigel Mormon, MD;  Location: [place]  CV [facility];  Service: Cardiovascular;  Laterality: N/A;  . LIGATION OF ARTERIOVENOUS  FISTULA Left 10/17/8889   Procedure: Plication of Left Arm Arteriovenous Fistula;  Surgeon: Elam Dutch, MD;  Location: [place];  Service: Vascular;  Laterality: Left;  . POLYPECTOMY    . REVISON OF ARTERIOVENOUS FISTULA Left 6/94/5038   Procedure: PLICATION OF DISTAL ANEURYSMAL SEGEMENT OF LEFT UPPER ARM ARTERIOVENOUS FISTULA;  Surgeon: Elam Dutch, MD;  Location: [place];  Service: Vascular;  Laterality: Left;  . REVISON OF ARTERIOVENOUS FISTULA Left 8/82/8003   Procedure: Plication of Left Upper Arm Fistula ;  Surgeon: Waynetta Sandy, MD;  Location: [place];  Service: Vascular;  Laterality: Left;     Social History:  Ambulatory   Independently    reports that he has been smoking Cigarettes.  He started smoking about 43 years ago. He has a 21.50 pack-year smoking history. He has never used smokeless tobacco. He reports that he drinks about 2.4 oz of alcohol per week . He reports that he uses drugs, including Marijuana and Cocaine.  Allergies:   Allergies  Allergen Reactions  . Aspirin Other (See Comments)    STOMACH PAIN  . Clonidine Derivatives Itching  . Tramadol Itching   Family History:   Family History  Problem Relation Age of Onset  . Heart disease Mother   . Lung cancer Mother   . Heart disease Father   . Malignant hyperthermia Father   . COPD Father   . Throat cancer Sister   . Esophageal cancer Sister   . Hypertension Other   . COPD Other   . Colon cancer Neg Hx   . Colon polyps Neg Hx   . Rectal cancer Neg Hx   . Stomach cancer Neg Hx     Medications: Prior to Admission medications   Medication Sig Start Date End Date Taking? Authorizing Provider  acetaminophen (TYLENOL) 500 MG tablet Take 500-1,000 mg by mouth every 6 (six) hours as needed (for headaches and pain).   Yes [provider]  albuterol (PROVENTIL HFA;VENTOLIN HFA) 108 (90 Base) MCG/ACT  inhaler Inhale 2 puffs into the lungs every 6 (six) hours as needed for wheezing or shortness of breath. 03/10/17  Yes Javier Glazier, MD  aspirin EC 81 MG tablet Take 81 mg by mouth daily.   Yes [provider]  budesonide-formoterol (SYMBICORT) 160-4.5 MCG/ACT inhaler Inhale 2 puffs into the lungs 2 (two) times daily.   Yes [provider]  calcitRIOL (ROCALTROL) 0.25 MCG capsule Take 3 capsules (0.75 mcg total) by mouth every Monday, Wednesday, and Friday with hemodialysis. 03/28/17  Yes Cherene Altes, MD  cinacalcet (SENSIPAR) 30 MG tablet Take 4 tablets (120 mg total) by mouth every Monday, Wednesday, and Friday with hemodialysis. 03/28/17  Yes Cherene Altes, MD  clopidogrel (PLAVIX) 75 MG tablet Take 1 tablet (75 mg total) by mouth daily. 02/24/17  Yes Velvet Bathe, MD  gabapentin (NEURONTIN) 100 MG capsule Take 100 mg by mouth up to two times a day 01/06/17  Yes [provider]  metoprolol succinate (TOPROL-XL) 25 MG 24 hr tablet Take 25 mg by mouth daily. 02/23/17  Yes [provider]  minoxidil (LONITEN) 10 MG tablet Take 10 mg by mouth 2 (two) times daily. 03/03/17  Yes [provider]  oxyCODONE (OXY IR/ROXICODONE) 5 MG immediate release tablet Take 5 mg by mouth 2 (two) times daily as needed for pain. 02/03/17  Yes [provider]  polyethylene glycol powder (GLYCOLAX/MIRALAX) powder Take 17grams (1 capful) dissolved in at least 8 ounces of water or juice, once daily Patient taking differently: Take 0.5 Containers by mouth daily as needed for mild constipation.  01/13/17  Yes Pyrtle, Lajuan Lines, MD  sevelamer carbonate (RENVELA) 800 MG tablet Take 3 tablets (2,400 mg total) by mouth 3 (three) times daily with meals. 03/26/17  Yes Cherene Altes, MD  Spacer/Aero-Holding Chambers (AEROCHAMBER MV) inhaler Use as instructed 03/10/17  Yes Javier Glazier, MD    Physical Exam: Patient Vitals for the past 24 hrs:  BP Pulse Resp SpO2    04/23/17 0000 (!) 173/110 85 17 98 %  04/22/17 2348 (!)  174/90 85 (!) 29 97 %  04/22/17 2230 (!) 169/97 84 19 97 %  04/22/17 2215 (!) 170/96 87 14 98 %  04/22/17 2200 (!) 178/100 88 15 97 %  04/22/17 2145 (!) 180/102 84 14 96 %  04/22/17 2130 (!) 181/114 84 18 94 %  04/22/17 2115 (!) 181/109 86 (!) 22 94 %  04/22/17 2100 (!) 172/101 83 16 98 %  04/22/17 2015 (!) 172/100 85 16 95 %  04/22/17 2000 (!) 159/95 86 18 95 %  04/22/17 1945 (!) 169/98 88 20 96 %  04/22/17 1912 - - - 99 %    1. General:  in No Acute distress   Chronically ill -appearing 2. Psychological: Alert and   Oriented 3. Head/ENT:   Moist   Mucous Membranes                          Head Non traumatic, neck supple                            Poor Dentition 4. SKIN:  decreased Skin turgor,  Skin clean Dry and intact no rash 5. Heart: Regular rate and rhythm  Murmur, no Rub or gallop 6. Lungs: no wheezes or crackles   7. Abdomen: Soft,  non-tender, distended   obese  bowel sounds present 8. Lower extremities: no clubbing, cyanosis, or edema 9. Neurologically Grossly intact, moving all 4 extremities equally  10. MSK: Normal range of motion   body mass index is unknown because there is no height or weight on file.  Labs on Admission:   Labs on Admission: I have personally reviewed following labs and imaging studies  CBC:  Recent Labs Lab 04/22/17 2002  WBC 7.7  HGB 9.8*  HCT 29.3*  MCV 83.0  PLT 57*   Basic Metabolic Panel:  Recent Labs Lab 04/22/17 2002  NA 132*  K 5.3*  CL 91*  CO2 30  GLUCOSE 112*  BUN 9  CREATININE 6.10*  CALCIUM 7.8*   GFR: CrCl cannot be calculated (Unknown ideal weight.). Liver Function Tests:  Recent Labs Lab 04/22/17 2002  AST 25  ALT 10*  ALKPHOS 117  BILITOT 1.1  PROT 7.2  ALBUMIN 4.0    Recent Labs Lab 04/22/17 2002  LIPASE 28   No results for input(s): AMMONIA in the last 168 hours. Coagulation Profile: No results for input(s): INR, PROTIME in  the last 168 hours. Cardiac Enzymes: No results for input(s): CKTOTAL, CKMB, CKMBINDEX, TROPONINI in the last 168 hours. BNP (last 3 results) No results for input(s): PROBNP in the last 8760 hours. HbA1C: No results for input(s): HGBA1C in the last 72 hours. CBG: No results for input(s): GLUCAP in the last 168 hours. Lipid Profile: No results for input(s): CHOL, HDL, LDLCALC, TRIG, CHOLHDL, LDLDIRECT in the last 72 hours. Thyroid Function Tests: No results for input(s): TSH, T4TOTAL, FREET4, T3FREE, THYROIDAB in the last 72 hours. Anemia Panel: No results for input(s): VITAMINB12, FOLATE, FERRITIN, TIBC, IRON, RETICCTPCT in the last 72 hours. Urine analysis:  Sepsis Labs: @LABRCNTIP (procalcitonin:4,lacticidven:4) )No results found for this or any previous visit (from the past 240 hour(s)).     UA not ordered  Lab Results  Component Value Date   HGBA1C 4.9 08/17/2016    CrCl cannot be calculated (Unknown ideal weight.).  BNP (last 3 results) No results for input(s): PROBNP in the last 8760 hours.  ECG REPORT  Independently reviewed Rate: 89  Rhythm: LBBB ST&T Change: NA QTC 571  There were no vitals filed for this visit.   Cultures:    Component Value Date/Time   SDES BLOOD RIGHT FOREARM 02/20/2017 1715   SPECREQUEST  02/20/2017 1715    BOTTLES DRAWN AEROBIC AND ANAEROBIC Blood Culture adequate volume   CULT NO GROWTH 5 DAYS 02/20/2017 1715   REPTSTATUS 02/25/2017 FINAL 02/20/2017 1715     Radiological Exams on Admission: Dg Chest 2 View  Result Date: 04/22/2017 CLINICAL DATA:  Nausea during dialysis today. Now with abdominal distension and mild dyspnea. EXAM: CHEST  2 VIEW COMPARISON:  03/24/2017 FINDINGS: Moderate enlargement of the cardiac silhouette, unchanged. Mild vascular and interstitial prominence, without focal airspace consolidation. No effusions. Sclerosis of multiple bones consistent with chronic renal disease. IMPRESSION: Mild vascular and  interstitial prominence. Unchanged cardiomegaly. No consolidation or effusion. Electronically Signed   By: Andreas Newport M.D.   On: 04/22/2017 20:46   Ct Abdomen Pelvis W Contrast  Result Date: 04/22/2017 CLINICAL DATA:  Abdominal distension, fatigue and nausea. EXAM: CT ABDOMEN AND PELVIS WITH CONTRAST TECHNIQUE: Multidetector CT imaging of the abdomen and pelvis was performed using the standard protocol following bolus administration of intravenous contrast. CONTRAST:  142mL ISOVUE-300 IOPAMIDOL (ISOVUE-300) INJECTION 61% COMPARISON:  10/08/2015 FINDINGS: Lower chest: Small pericardial effusion measuring 14 mm in greatest thickness. Enlarged heart. Calcific atherosclerotic disease of the coronary arteries. Hepatobiliary: No focal liver abnormality is seen. The gallbladder is decompressed and therefore poorly evaluated. Pancreas: Unremarkable. No pancreatic ductal dilatation or surrounding inflammatory changes. Spleen: Normal in size without focal abnormality. Adrenals/Urinary Tract: Atrophic kidneys. Normal appearance of the adrenal glands. Normal appearance of the urinary bladder. Stomach/Bowel: Stomach is within normal limits. No evidence of bowel wall thickening, distention, or inflammatory changes. Vascular/Lymphatic: Aortic atherosclerosis. No enlarged abdominal or pelvic lymph nodes. Reproductive: Prostate is unremarkable. Other: Large volume abdominopelvic ascites. Musculoskeletal: Stable findings of renal osteodystrophy. L5-S1 osteoarthritis. IMPRESSION: Large volume abdominopelvic ascites. Small pericardial effusion. Atrophic kidneys. Calcific atherosclerotic disease of the aorta and its abdominal branches. Electronically Signed   By: Fidela Salisbury M.D.   On: 04/22/2017 23:25    Chart has been reviewed    Assessment/Plan  56 y.o. male with medical history significant of hepatitis B  alcohol abuse, ESRD, COPD, tobacco abuse, prior history of cocaine abuse, GERD, coronary artery  disease status post stent placement on 02/22/2017 to proximal RCA, history of A. Flutter with aberrancy  status post cardioversion 03/24/2017, A flutter induced CVA, Alpha -1 antitrypsin deficiency Admitted for large volume symptomatic ascites and chest pain  Present on Admission: Coronary artery disease -reports unsure if compliant with Plavix. Patient reports occasional chest pain with cycle cardiac enzymes monitor on telemetry restart Plavix . Essential hypertension currently stable continue to monitor not on any home blood pressure medications . Chronic hepatitis C without hepatic coma (HCC) patient states that he has been treated we'll obtain hepatitis viral load can the confirm   . Atrial flutter ([place]) not on anticoagulation secondary to noncompliance currently rate controlled and sure if patient is compliant home medications . Thrombocytopenia ([place]) in the setting of chronic liver disease at this point no indication for transfusional continue to monitor . Anemia stable continue to monitor . Ascites - IR consult for diagnostic and therapeutic tap. Patient with history of noncompliance . COPD (chronic obstructive pulmonary disease) ([place]) currently stable continue home medications . End stage renal disease ([place]) - on hemodialysis Monday Wednesday  Friday but was not able to finish last hemodialysis left a message to nephrology on the voicemail  Other plan as per orders.  DVT prophylaxis:  SCD   Code Status:  FULL CODE  as per patient    Family Communication:   Family not at  Bedside   Disposition Plan:     To home once workup is complete and patient is stable                     Social Work                            Consults called: LEFT phone msg with Nephrology   Admission status:  inpatient       Level of care     tele         I have spent a total of 56 min on this admission   Alizay Bronkema 04/23/2017, 4:01 AM     [facility]  Pager [phone]   after 2 AM  please page floor coverage PA If 7AM-7PM, please contact the day team taking care of the patient  [email or web address]  Password [email or web address]

## 2017-04-23 NOTE — Progress Notes (Signed)
PT Cancellation Note  Patient Details Name: [name] MRN: [id number] DOB: [birth year]   Cancelled Treatment:    Reason Eval/Treat Not Completed: PT screened, no needs identified, will sign off. Pt reports he is amb in the room without problems and feels he is at his baseline.   [place] 04/23/2017, 5:13 PM  [place]

## 2017-04-23 NOTE — ED Notes (Signed)
Discussed MD's order for urine sample. Pt states that he no longer produces urine.

## 2017-04-23 NOTE — Progress Notes (Signed)
Patient presented to radiology department for possible paracentesis.  Patient does have a small amount of ascites that is generally distributed with the largest collection of fluid in the pelvis which is not amenable to paracentesis.  Limited US Abdomen to be dictated separately.  No procedure performed.  Patient returned to unit after all questions answered.   Brynda Greathouse, MS RD PA-C 1:31 PM

## 2017-04-23 NOTE — Progress Notes (Signed)
Patient was set up for extra HD today to get volume and hopefully ascites down, however he has repeatedly refused to dialyze today.  Sunday dialysis is only for emergencies.  Plan next HD on Monday if still here.     [name] MD [employer] pgr [phone]   04/23/2017, 7:34 PM

## 2017-04-23 NOTE — Progress Notes (Signed)
Patient was seen and examined at bedside. He was admitted after midnight. Please see is M.D. For detail. 57 year old male with history of hepatitis B, alcohol abuse, ESRD on hemodialysis Monday Wednesday Friday, COPD, tobacco abuse, prior history of cocaine abuse, GERD, coronary artery disease status post stent placement in August 2018, atrial flutter with aberrancy status post cardioversion in September 2018, atrial flutter induced stroke presented with nausea vomiting is associated with abdominal pain and chest pain. He was also found to have abdominal distention. Patient was admitted for symptomatic ascites. -IR to do paracentesis both diagnostic and possible therapeutic. -Nephrology consulted for the management of ESRD -patient with hyponatremia and hyperkalemia and ESRD patient, may benefit from hemodialysis. Patient likely needs dry weight adjustment and probably increasing ultrafiltration during dialysis in order to manage his ascites. -monitor labs.mild elevation in troponin likely demand ischemia and ESRD patient.  Continue current medical and supportive care.

## 2017-04-23 NOTE — ED Notes (Signed)
Pt given decaf coffee and graham crackers, per pt request.

## 2017-04-24 ENCOUNTER — Inpatient Hospital Stay (HOSPITAL_COMMUNITY): Payer: Medicare Other

## 2017-04-24 DIAGNOSIS — D696 Thrombocytopenia, unspecified: Secondary | ICD-10-CM

## 2017-04-24 DIAGNOSIS — G9341 Metabolic encephalopathy: Secondary | ICD-10-CM

## 2017-04-24 DIAGNOSIS — J9621 Acute and chronic respiratory failure with hypoxia: Secondary | ICD-10-CM

## 2017-04-24 LAB — HCV RNA QUANT: HCV Quantitative: NOT DETECTED IU/mL (ref >50)

## 2017-04-24 LAB — BASIC METABOLIC PANEL
Anion gap: 13 (ref 5–15)
BUN: 10 mg/dL (ref 6–20)
CO2: 28 mmol/L (ref 22–32)
Calcium: 7.7 mg/dL — ABNORMAL LOW (ref 8.9–10.3)
Chloride: 91 mmol/L — ABNORMAL LOW (ref 101–111)
Creatinine, Ser: 4.9 mg/dL — ABNORMAL HIGH (ref 0.61–1.24)
GFR calc Af Amer: 14 mL/min — ABNORMAL LOW (ref >60)
GFR calc non Af Amer: 12 mL/min — ABNORMAL LOW (ref >60)
Glucose, Bld: 175 mg/dL — ABNORMAL HIGH (ref 65–99)
Potassium: 4.3 mmol/L (ref 3.5–5.1)
Sodium: 132 mmol/L — ABNORMAL LOW (ref 135–145)

## 2017-04-24 LAB — CBC
HCT: 31.5 % — ABNORMAL LOW (ref 39.0–52.0)
Hemoglobin: 10.8 g/dL — ABNORMAL LOW (ref 13.0–17.0)
MCH: 28.8 pg (ref 26.0–34.0)
MCHC: 34.3 g/dL (ref 30.0–36.0)
MCV: 84 fL (ref 78.0–100.0)
Platelets: 71 10*3/uL — ABNORMAL LOW (ref 150–400)
RBC: 3.75 MIL/uL — ABNORMAL LOW (ref 4.22–5.81)
RDW: 17.1 % — ABNORMAL HIGH (ref 11.5–15.5)
WBC: 12.9 10*3/uL — ABNORMAL HIGH (ref 4.0–10.5)

## 2017-04-24 LAB — BLOOD GAS, ARTERIAL
Acid-base deficit: 2.7 mmol/L — ABNORMAL HIGH (ref 0.0–2.0)
Bicarbonate: 22.1 mmol/L (ref 20.0–28.0)
Drawn by: 270221
FIO2: 100
O2 Saturation: 99.8 %
Patient temperature: 98.6
pCO2 arterial: 41.7 mmHg (ref 32.0–48.0)
pH, Arterial: 7.345 — ABNORMAL LOW (ref 7.350–7.450)
pO2, Arterial: 475 mmHg — ABNORMAL HIGH (ref 83.0–108.0)

## 2017-04-24 LAB — HEMOGLOBIN A1C
Hgb A1c MFr Bld: 4.6 % — ABNORMAL LOW (ref 4.8–5.6)
Mean Plasma Glucose: 85.32 mg/dL

## 2017-04-24 LAB — AMMONIA: Ammonia: 63 umol/L — ABNORMAL HIGH (ref 9–35)

## 2017-04-24 LAB — GLUCOSE, CAPILLARY
Glucose-Capillary: 47 mg/dL — ABNORMAL LOW (ref 65–99)
Glucose-Capillary: 53 mg/dL — ABNORMAL LOW (ref 65–99)
Glucose-Capillary: 68 mg/dL (ref 65–99)
Glucose-Capillary: 70 mg/dL (ref 65–99)
Glucose-Capillary: 62 mg/dL — ABNORMAL LOW (ref 65–99)
Glucose-Capillary: 156 mg/dL — ABNORMAL HIGH (ref 65–99)
Glucose-Capillary: 142 mg/dL — ABNORMAL HIGH (ref 65–99)

## 2017-04-24 MED ORDER — PENTAFLUOROPROP-TETRAFLUOROETH EX AERO: 1.0000 "application " | INHALATION_SPRAY | CUTANEOUS | Status: DC | PRN

## 2017-04-24 MED ORDER — DEXTROSE 50 % IV SOLN
INTRAVENOUS | Status: AC
  Administered 2017-04-24: 12.5 g
  Filled 2017-04-24: qty 50

## 2017-04-24 MED ORDER — LIDOCAINE HCL (PF) 1 % IJ SOLN: 5.0000 mL | INTRAMUSCULAR | Status: DC | PRN

## 2017-04-24 MED ORDER — DEXTROSE 50 % IV SOLN
50.0000 mL | INTRAVENOUS | Status: DC | PRN
  Administered 2017-04-24: 50 mL via INTRAVENOUS
  Filled 2017-04-24: qty 50

## 2017-04-24 MED ORDER — LORAZEPAM 2 MG/ML IJ SOLN
INTRAMUSCULAR | Status: AC
  Administered 2017-04-24: 1 mg
  Filled 2017-04-24: qty 1

## 2017-04-24 MED ORDER — ALTEPLASE 2 MG IJ SOLR: 2.0000 mg | Freq: Once | INTRAMUSCULAR | Status: DC | PRN

## 2017-04-24 MED ORDER — LORAZEPAM 2 MG/ML IJ SOLN
INTRAMUSCULAR | Status: AC
  Filled 2017-04-24: qty 1

## 2017-04-24 MED ORDER — SODIUM CHLORIDE 0.9 % IV SOLN
100.0000 mL | INTRAVENOUS | Status: DC | PRN
Start: 2017-04-24 — End: 2017-04-25

## 2017-04-24 MED ORDER — LIDOCAINE-PRILOCAINE 2.5-2.5 % EX CREA: 1.0000 "application " | TOPICAL_CREAM | CUTANEOUS | Status: DC | PRN

## 2017-04-24 MED ORDER — DEXTROSE 50 % IV SOLN
25.0000 mL | Freq: Once | INTRAVENOUS | Status: AC
  Administered 2017-04-24: 25 mL via INTRAVENOUS

## 2017-04-24 MED ORDER — LORAZEPAM 2 MG/ML IJ SOLN
1.0000 mg | Freq: Once | INTRAMUSCULAR | Status: AC
  Administered 2017-04-24: 1 mg via INTRAVENOUS

## 2017-04-24 MED ORDER — LORAZEPAM BOLUS VIA INFUSION: 1.0000 mg | INTRAVENOUS | Status: DC

## 2017-04-24 MED ORDER — LACTULOSE 10 GM/15ML PO SOLN
30.0000 g | Freq: Two times a day (BID) | ORAL | Status: DC
  Administered 2017-04-24: 30 g via ORAL
  Filled 2017-04-24: qty 45

## 2017-04-24 MED ORDER — SODIUM CHLORIDE 0.9 % IV SOLN: 100.0000 mL | INTRAVENOUS | Status: DC | PRN

## 2017-04-24 NOTE — Progress Notes (Signed)
Patients CBG 62. Amp D50% given as ordered.

## 2017-04-24 NOTE — Progress Notes (Signed)
[facility] Progress Note  Subjective: SOB this am, O2 sat in 60's, better on bipap.   Vitals:   04/23/17 1832 04/23/17 2043 04/24/17 0444 04/24/17 0818  BP:  (!) 174/98 (!) 190/89 (!) 196/90  Pulse:  89 83 71  Resp:  19 19 (!) 23  Temp:  98.6 F (37 C)    TempSrc:  Oral    SpO2: 98% 100% 97% 100%  Weight:  74.1 kg (163 lb 4.8 oz)    Height:        Inpatient medications: . aspirin EC  81 mg Oral Daily  . [START ON 04/25/2017] calcitRIOL  1 mcg Oral Q M,W,F-HD  . [START ON 04/25/2017] cinacalcet  60 mg Oral Q M,W,F-HD  . clopidogrel  75 mg Oral Daily  . LORazepam      . metoprolol succinate  25 mg Oral QHS  . polyethylene glycol  17 g Oral Daily  . sevelamer carbonate  2,400 mg Oral TID WC  . sodium chloride flush  3 mL Intravenous Q12H   . sodium chloride    . sodium chloride    . sodium chloride    . [START ON 04/27/2017] ferric gluconate (FERRLECIT/NULECIT) IV     sodium chloride, sodium chloride, sodium chloride, alteplase, ipratropium-albuterol, lidocaine (PF), lidocaine-prilocaine, ondansetron **OR** ondansetron (ZOFRAN) IV, oxyCODONE, pentafluoroprop-tetrafluoroeth, sodium chloride flush, traZODone  Exam: On FM O2, confused +JVD Chest bilat rales 1/3 up RRR no rub  2/6 SEM Abd firm, distended, nontender Ext no LE edema L AVF tortuous, +bruit NF, ox 3   CXR =- marked CM today, +vasc congestion and early IS edema  Dialysis: [facility] 4h   71.5kg   L AVF  Hep 3000 -mircera 150 last 10/3 -sensipar 120 -calc 1 ug -hep B+ needs isolation      Impression: 1. Abd distension - poss ascites. CT + for ascites but IR did [place] and didn't see much fluid, paracentesis cancelled.  2. ESRD HD MWF 3. Resp distress/ hypoxemia - + pulm edema per CXR, needs urgent HD this am.   4. HTN - changed MTP XL to qhs and dc'd minoxidil for now (just started on last admit). Get vol down with HD is primary goal for BP control.  5. COPD 6. MBD no chg 7. Anemia of CKD,  no chg Hb 10.8 8. Cirrhosis - sp Rx for hep C. Hep B+ 9. Chronic TCP (low plts) 10. Volume - vol is up, 3kg over dry, prob needs lower dry wt as well  Plan - urgent HD today.  Daily HD for now, establish new dry wt and get BP down   [name] MD [facility] pager [phone]   04/24/2017, 10:45 AM    Recent Labs Lab 04/23/17 0502 04/23/17 0759 04/24/17 0724  NA 132* 131* 130*  K 5.4* 5.6* 6.8*  CL 91* 91* 87*  CO2 28 24 23   GLUCOSE 88 72 PENDING  BUN 10 12 19   CREATININE 6.62* 6.81* 8.34*  CALCIUM 7.6* 7.3* 7.7*  PHOS 3.1 3.2  --     Recent Labs Lab 04/22/17 2002 04/23/17 0502 04/23/17 0759  AST 25  --  22  ALT 10*  --  9*  ALKPHOS 117  --  103  BILITOT 1.1  --  1.2  PROT 7.2  --  7.1  ALBUMIN 4.0 3.8 3.9    Recent Labs Lab 04/23/17 0413 04/23/17 0759 04/24/17 0752  WBC 8.1 7.4 12.9*  HGB 9.6* 9.6* 10.8*  HCT 30.2* 29.2* 31.5*  MCV 86.5 83.7 84.0  PLT 58* 56* 71*   Iron/TIBC/Ferritin/ %Sat    Component Value Date/Time   IRON 116 06/25/2011 0837   TIBC 277 06/25/2011 0837   FERRITIN 550 (H) 06/25/2011 0837   IRONPCTSAT 42 06/25/2011 3729

## 2017-04-24 NOTE — Progress Notes (Signed)
Blood sugar level >1200 in BMP likely lab error. Repeat finger stick blood sugar level 47 as per RN. Will give a dose of dextrose, check CBG frequently. Check a1c level.

## 2017-04-24 NOTE — Progress Notes (Signed)
Report given to Hemodialysis RN. ?

## 2017-04-24 NOTE — Progress Notes (Signed)
Rapid Response called for ABG on pt. ABG obtained. Pt placed on Bipap & tolerating well at this time.

## 2017-04-24 NOTE — Progress Notes (Signed)
Call from the Lab at this time informing this RN that patient's glucose level is >1200. Dr. Carolin Sicks paged to notify of results.

## 2017-04-24 NOTE — Progress Notes (Signed)
Pt transferred to Dialysis on Bipap 10/5 40% rate of 8. No complications noted. Cecilio Asper RRT RCP given report on the patient.

## 2017-04-24 NOTE — Significant Event (Signed)
Rapid Response Event Note  Overview:  Called by RN for assistance Time Called: 0726 Arrival Time: 0730 Event Type: Respiratory, Neurologic  Initial Focused Assessment:  Called by RN for assistance.  ON my arrival to patients room, RN at bedside.  Patient in bed on NRB.  AS per RN patient suddenly became very confused and dropped his sats to 56%.  Skin is cool and dry.  Hard to obtain sat due to cold extremities.  SBP elevated 190's.  Patient is very agitated and restless.   Interventions: Lab at bedside to obtain labs.  MD paged to bedside.  ABG ordered.  BIPAP ordered PAtient to transfer to SDU.   ABG results 7.34, 41, 475 HCO3 25-  As per Dr. Melvia Heaps still attempt BIPAP.  RRT/RT will sit with patient until bed available.  Patient tolerating BIPAP at current time  Plan of Care (if not transferred): 1000- Assisted with transport to HD unit via BIPAP and bed with tele monitor on.  RT present during transport Patient to transfer to SDU  Event Summary:   at      at          [facility]

## 2017-04-24 NOTE — Progress Notes (Signed)
PROGRESS NOTE    GARRELL FLAGG  KZS:[id number] DOB: 1961/06/30 DOA: 04/22/2017 PCP: [name], MD   Brief Narrative: 56 year old male with history of hepatitis B, alcohol abuse, ESRD, COPD and tobacco abuse polysubstance abuse including cocaine, GERD, coronary artery disease status post stent placement on 02/22/2017 to proximal RCA, history of A. Flutter with aberrancy  status post cardioversion 03/24/2017, A flutter induced CVA,presented with nausea, fatigue, abdominal distention associated with shortness of breath. As per prior chart, patient has history of paroxysmal atrial fibrillation with left bundle branch block initially started on Coumadin but has not been compliant with the medication therefore it was discontinued. Patient with history of intermittent refusal to dialysis treatment.  Assessment & Plan:  #  Acute respiratory failure with hypoxia likely due to acute pulmonary edema contributed by patient's noncompliance with dialysis treatment: -Patient had incomplete dialysis treatment on Friday. He had a mild hyperkalemia yesterday however he declined/refused hemodialysis.Today rapid response was called at bedside because of hypoxia and respiratory distress.oxygen saturation improved with nonrebreather. -Check stat x-ray, CBC, BMP, ABG -discussed with Dr. Jonnie Finner for urgent hemodialysis. -Start BiPAP and transferred to the stepdown bed. -Continue nebulizer and supportive care. I have discussed with the rapid response nurse and respiratory therapist.  #ESRD on hemodialysis/noncompliance: Plan for urgent dialysis today. I talked to Dr. Jonnie Finner today. Patient has left upper extremity AV fistula.  # acute metabolic encephalopathy: Patient was confused this morning while having respiratory distress. Check stat ABG. Patient was non-cooperative with breathing treatment and agitated.Ordered a dose of Ativan to calm down. He has no focal neurological deficit. Monitor labs.  #mild  hyperkalemia in ESRD patient: Low potassium diet and dialysis treatment.  # anemia of chronic kidney disease: Continue IV iron and ESA during dialysis. No sign of active bleeding. Monitor CBC.  #chronic thrombocytopenia: Platelet count is stable. Likely in the setting of alcohol abuse, may have liver disease unknown  #abdomen distention/ascites: IR unable to paracentesis because of generalized distribution of ascites without single large pocket. It is probably because of fluid overload in the setting of chronic noncompliance with hemodialysis treatment and ultrafiltration. Low-salt diet. No sign of infection.  #secondary hyperparathyroidism: Currently on calcitriol Sensipar  #. Coronary artery disease status post stent: Continue aspirin, Plavix, metoprolol. Patient with mild elevation in troponin likely in the setting of ESRD. Echo in August 2018 showed severe LV hypertrophy, EF of 40-45%.  DVT prophylaxis:SCD and early ambulation. Patient has thrombocytopenia. Code Status:full code Family Communication:no family at bedside Disposition Plan:currently admitted    Consultants:   nephrology  Procedures:plan for BiPAP Antimicrobials:none  Subjective: Seen and examined at bedside during rapid response.patient was alert awake but confused and agitated. He was in respiratory distress. He denied chest pain however review of system was limited.  Objective: Vitals:   04/23/17 0953 04/23/17 1832 04/23/17 2043 04/24/17 0444  BP: (!) 161/97  (!) 174/98 (!) 190/89  Pulse: 84  89 83  Resp: 17  19 19   Temp: 98.2 F (36.8 C)  98.6 F (37 C)   TempSrc: Oral  Oral   SpO2: 100% 98% 100% 97%  Weight:   74.1 kg (163 lb 4.8 oz)   Height:        Intake/Output Summary (Last 24 hours) at 04/24/17 0754 Last data filed at 04/24/17 0200  Gross per 24 hour  Intake                0 ml  Output  0 ml  Net                0 ml   Filed Weights   04/23/17 0447 04/23/17 2043  Weight:  73.8 kg (162 lb 9.6 oz) 74.1 kg (163 lb 4.8 oz)    Examination:  General exam: confused, agitated and in mild respiratory distress Respiratory system: bilateral crackles, increased respiratory work Cardiovascular system: regular rate rhythm, S1-S2 normal. No pedal edema Gastrointestinal system: abdomen soft, nontender, nondistended. Bowel sound positive Central nervous system: confused and as stated Skin: No rashes, lesions or ulcers Psychiatry: Judgement and insight appear impaired    Data Reviewed: I have personally reviewed following labs and imaging studies  CBC:  Recent Labs Lab 04/22/17 2002 04/23/17 0413 04/23/17 0759  WBC 7.7 8.1 7.4  HGB 9.8* 9.6* 9.6*  HCT 29.3* 30.2* 29.2*  MCV 83.0 86.5 83.7  PLT 57* 58* 56*   Basic Metabolic Panel:  Recent Labs Lab 04/22/17 2002 04/23/17 0502 04/23/17 0759  NA 132* 132* 131*  K 5.3* 5.4* 5.6*  CL 91* 91* 91*  CO2 30 28 24   GLUCOSE 112* 88 72  BUN 9 10 12   CREATININE 6.10* 6.62* 6.81*  CALCIUM 7.8* 7.6* 7.3*  MG  --   --  2.2  PHOS  --  3.1 3.2   GFR: Estimated Creatinine Clearance: 12.4 mL/min (A) (by C-G formula based on SCr of 6.81 mg/dL (H)). Liver Function Tests:  Recent Labs Lab 04/22/17 2002 04/23/17 0502 04/23/17 0759  AST 25  --  22  ALT 10*  --  9*  ALKPHOS 117  --  103  BILITOT 1.1  --  1.2  PROT 7.2  --  7.1  ALBUMIN 4.0 3.8 3.9    Recent Labs Lab 04/22/17 2002  LIPASE 28   No results for input(s): AMMONIA in the last 168 hours. Coagulation Profile: No results for input(s): INR, PROTIME in the last 168 hours. Cardiac Enzymes:  Recent Labs Lab 04/23/17 0759 04/23/17 1635  TROPONINI 0.03* 0.04*   BNP (last 3 results) No results for input(s): PROBNP in the last 8760 hours. HbA1C: No results for input(s): HGBA1C in the last 72 hours. CBG: No results for input(s): GLUCAP in the last 168 hours. Lipid Profile: No results for input(s): CHOL, HDL, LDLCALC, TRIG, CHOLHDL, LDLDIRECT  in the last 72 hours. Thyroid Function Tests:  Recent Labs  04/23/17 0413  TSH 2.173   Anemia Panel: No results for input(s): VITAMINB12, FOLATE, FERRITIN, TIBC, IRON, RETICCTPCT in the last 72 hours. Sepsis Labs: No results for input(s): PROCALCITON, LATICACIDVEN in the last 168 hours.  Recent Results (from the past 240 hour(s))  MRSA PCR Screening     Status: None   Collection Time: 04/23/17  7:58 AM  Result Value Ref Range Status   MRSA by PCR NEGATIVE NEGATIVE Final    Comment:        The GeneXpert MRSA Assay (FDA approved for NASAL specimens only), is one component of a comprehensive MRSA colonization surveillance program. It is not intended to diagnose MRSA infection nor to guide or monitor treatment for MRSA infections.          Radiology Studies: Dg Chest 2 View  Result Date: 04/22/2017 CLINICAL DATA:  Nausea during dialysis today. Now with abdominal distension and mild dyspnea. EXAM: CHEST  2 VIEW COMPARISON:  03/24/2017 FINDINGS: Moderate enlargement of the cardiac silhouette, unchanged. Mild vascular and interstitial prominence, without focal airspace consolidation. No effusions. Sclerosis of multiple  bones consistent with chronic renal disease. IMPRESSION: Mild vascular and interstitial prominence. Unchanged cardiomegaly. No consolidation or effusion. Electronically Signed   By: Andreas Newport M.D.   On: 04/22/2017 20:46   Ct Abdomen Pelvis W Contrast  Result Date: 04/22/2017 CLINICAL DATA:  Abdominal distension, fatigue and nausea. EXAM: CT ABDOMEN AND PELVIS WITH CONTRAST TECHNIQUE: Multidetector CT imaging of the abdomen and pelvis was performed using the standard protocol following bolus administration of intravenous contrast. CONTRAST:  150mL ISOVUE-300 IOPAMIDOL (ISOVUE-300) INJECTION 61% COMPARISON:  10/08/2015 FINDINGS: Lower chest: Small pericardial effusion measuring 14 mm in greatest thickness. Enlarged heart. Calcific atherosclerotic disease of  the coronary arteries. Hepatobiliary: No focal liver abnormality is seen. The gallbladder is decompressed and therefore poorly evaluated. Pancreas: Unremarkable. No pancreatic ductal dilatation or surrounding inflammatory changes. Spleen: Normal in size without focal abnormality. Adrenals/Urinary Tract: Atrophic kidneys. Normal appearance of the adrenal glands. Normal appearance of the urinary bladder. Stomach/Bowel: Stomach is within normal limits. No evidence of bowel wall thickening, distention, or inflammatory changes. Vascular/Lymphatic: Aortic atherosclerosis. No enlarged abdominal or pelvic lymph nodes. Reproductive: Prostate is unremarkable. Other: Large volume abdominopelvic ascites. Musculoskeletal: Stable findings of renal osteodystrophy. L5-S1 osteoarthritis. IMPRESSION: Large volume abdominopelvic ascites. Small pericardial effusion. Atrophic kidneys. Calcific atherosclerotic disease of the aorta and its abdominal branches. Electronically Signed   By: Fidela Salisbury M.D.   On: 04/22/2017 23:25   US Abdomen Limited  Result Date: 04/23/2017 CLINICAL DATA:  Ascites. EXAM: LIMITED ABDOMEN ULTRASOUND FOR ASCITES TECHNIQUE: Limited ultrasound survey for ascites was performed in all four abdominal quadrants. COMPARISON:  CT of the abdomen and pelvis on 04/22/2017 FINDINGS: In preparation for possible paracentesis, ultrasound demonstrated moderate intraperitoneal ascites which was distributed between bowel loops. A single large enough consistent pocket was not felt to be present to allow paracentesis today. IMPRESSION: Unable to perform paracentesis today due to generalized distribution of ascites without single large pocket. Should there be worsening of ascites clinically, repeat assessment for paracentesis could be performed. Electronically Signed   By: Aletta Edouard M.D.   On: 04/23/2017 14:21        Scheduled Meds: . aspirin EC  81 mg Oral Daily  . [START ON 04/25/2017] calcitRIOL  1  mcg Oral Q M,W,F-HD  . [START ON 04/25/2017] cinacalcet  60 mg Oral Q M,W,F-HD  . clopidogrel  75 mg Oral Daily  . LORazepam      . metoprolol succinate  25 mg Oral QHS  . polyethylene glycol  17 g Oral Daily  . sevelamer carbonate  2,400 mg Oral TID WC  . sodium chloride flush  3 mL Intravenous Q12H   Continuous Infusions: . sodium chloride    . [START ON 04/27/2017] ferric gluconate (FERRLECIT/NULECIT) IV       LOS: 1 day    Charlean Carneal Tanna Furry, MD [facility] Pager [phone]  If 7PM-7AM, please contact night-coverage [email or web address] Password [email or web address] 04/24/2017, 7:54 AM

## 2017-04-24 NOTE — Progress Notes (Signed)
Came into patients room to get update on patient from night shift RN [name]. Patient very confused. O2 sats at 66%. VSS taken. MD called. Hemodialysis MD paged. Dr. Carolin Sicks came to assess patient. Orders written. Rapid RN Langley Gauss present. Non-rebreather applied. Bi-pap ordered. Ativan 1 mg IV given as ordered. Patient refused hemodialysis yesterday.  Orders for HD now.  Monitor patient until patient goes to Hemodialysis.   Sheliah Plane RN

## 2017-04-24 NOTE — Progress Notes (Signed)
OT Cancellation Note  Patient Details Name: [name] MRN: [id number] DOB: 09-06-1960   Cancelled Treatment:    Reason Eval/Treat Not Completed: Medical issues which prohibited therapy. Will follow up for OT eval as pt is medically appropriate and time allows.  Binnie Kand M.S., OTR/L Pager: [phone]  04/24/2017, 3:05 PM

## 2017-04-25 ENCOUNTER — Encounter: Payer: Self-pay | Admitting: Vascular Surgery

## 2017-04-25 DIAGNOSIS — J410 Simple chronic bronchitis: Secondary | ICD-10-CM

## 2017-04-25 LAB — RENAL FUNCTION PANEL
Albumin: 3.7 g/dL (ref 3.5–5.0)
Anion gap: 13 (ref 5–15)
BUN: 13 mg/dL (ref 6–20)
CO2: 26 mmol/L (ref 22–32)
Calcium: 7.5 mg/dL — ABNORMAL LOW (ref 8.9–10.3)
Chloride: 89 mmol/L — ABNORMAL LOW (ref 101–111)
Creatinine, Ser: 6.84 mg/dL — ABNORMAL HIGH (ref 0.61–1.24)
GFR calc Af Amer: 9 mL/min — ABNORMAL LOW (ref >60)
GFR calc non Af Amer: 8 mL/min — ABNORMAL LOW (ref >60)
Glucose, Bld: 124 mg/dL — ABNORMAL HIGH (ref 65–99)
Phosphorus: 3.6 mg/dL (ref 2.5–4.6)
Potassium: 4.4 mmol/L (ref 3.5–5.1)
Sodium: 128 mmol/L — ABNORMAL LOW (ref 135–145)

## 2017-04-25 LAB — GLUCOSE, CAPILLARY
Glucose-Capillary: 99 mg/dL (ref 65–99)
Glucose-Capillary: 101 mg/dL — ABNORMAL HIGH (ref 65–99)
Glucose-Capillary: 131 mg/dL — ABNORMAL HIGH (ref 65–99)

## 2017-04-25 LAB — CBC
HCT: 28.1 % — ABNORMAL LOW (ref 39.0–52.0)
Hemoglobin: 9.5 g/dL — ABNORMAL LOW (ref 13.0–17.0)
MCH: 27.8 pg (ref 26.0–34.0)
MCHC: 33.8 g/dL (ref 30.0–36.0)
MCV: 82.2 fL (ref 78.0–100.0)
Platelets: 72 10*3/uL — ABNORMAL LOW (ref 150–400)
RBC: 3.42 MIL/uL — ABNORMAL LOW (ref 4.22–5.81)
RDW: 16.7 % — ABNORMAL HIGH (ref 11.5–15.5)
WBC: 9.8 10*3/uL (ref 4.0–10.5)

## 2017-04-25 MED ORDER — PENTAFLUOROPROP-TETRAFLUOROETH EX AERO: 1.0000 "application " | INHALATION_SPRAY | CUTANEOUS | Status: DC | PRN

## 2017-04-25 MED ORDER — LIDOCAINE HCL (PF) 1 % IJ SOLN: 5.0000 mL | INTRAMUSCULAR | Status: DC | PRN

## 2017-04-25 MED ORDER — HEPARIN SODIUM (PORCINE) 1000 UNIT/ML DIALYSIS: 3000.0000 [IU] | Freq: Once | INTRAMUSCULAR | Status: DC

## 2017-04-25 MED ORDER — HEPARIN SODIUM (PORCINE) 1000 UNIT/ML DIALYSIS: 1000.0000 [IU] | INTRAMUSCULAR | Status: DC | PRN

## 2017-04-25 MED ORDER — SODIUM CHLORIDE 0.9 % IV SOLN: 100.0000 mL | INTRAVENOUS | Status: DC | PRN

## 2017-04-25 MED ORDER — NICOTINE 14 MG/24HR TD PT24
14.0000 mg | MEDICATED_PATCH | Freq: Every day | TRANSDERMAL | Status: DC
  Administered 2017-04-25: 14 mg via TRANSDERMAL
  Filled 2017-04-25: qty 1

## 2017-04-25 MED ORDER — LIDOCAINE-PRILOCAINE 2.5-2.5 % EX CREA: 1.0000 "application " | TOPICAL_CREAM | CUTANEOUS | Status: DC | PRN

## 2017-04-25 MED ORDER — CALCITRIOL 0.5 MCG PO CAPS
ORAL_CAPSULE | ORAL | Status: AC
  Administered 2017-04-25: 1 ug via ORAL
  Filled 2017-04-25: qty 2

## 2017-04-25 MED ORDER — ALTEPLASE 2 MG IJ SOLR: 2.0000 mg | Freq: Once | INTRAMUSCULAR | Status: DC | PRN

## 2017-04-25 NOTE — Progress Notes (Signed)
[name] Progress Note   Subjective:   Patient is feeling better. Less bloated. No new c/os. Denies n/v/d, SOB, CP, weakness and edema.  Objective Vitals:   04/24/17 1459 04/24/17 1944 04/25/17 0506 04/25/17 0900  BP: (!) 158/79 114/63 (!) 154/83 124/64  Pulse: 80 88 78 75  Resp: 18  18 18   Temp: 97.6 F (36.4 C) 98.5 F (36.9 C) 98 F (36.7 C) 98 F (36.7 C)  TempSrc: Axillary Axillary Oral Oral  SpO2: 100% 96% 99% 98%  Weight:      Height:       Physical Exam General:NAD, WDWN Heart:RRR Lungs:Mostly clear, crackles in left base Abdomen:mildly distended, nontender, +BS Extremities:no edema Dialysis Access: L AVF   [facility] Weights   04/23/17 2043 04/24/17 1025 04/24/17 1402  Weight: 74.1 kg (163 lb 4.8 oz) 73.5 kg (162 lb 0.6 oz) 69.9 kg (154 lb 1.6 oz)    Intake/Output Summary (Last 24 hours) at 04/25/17 1144 Last data filed at 04/25/17 1000  Gross per 24 hour  Intake              720 ml  Output             4000 ml  Net            -3280 ml    Additional Objective Labs: Basic Metabolic Panel:  Recent Labs Lab 04/23/17 0502 04/23/17 0759 04/24/17 0724 04/24/17 1518  NA 132* 131* QUESTIONABLE RESULTS, RECOMMEND RECOLLECT TO VERIFY 132*  K 5.4* 5.6* QUESTIONABLE RESULTS, RECOMMEND RECOLLECT TO VERIFY 4.3  CL 91* 91* QUESTIONABLE RESULTS, RECOMMEND RECOLLECT TO VERIFY 91*  CO2 28 24 QUESTIONABLE RESULTS, RECOMMEND RECOLLECT TO VERIFY 28  GLUCOSE 88 72 QUESTIONABLE RESULTS, RECOMMEND RECOLLECT TO VERIFY 175*  BUN 10 12 QUESTIONABLE RESULTS, RECOMMEND RECOLLECT TO VERIFY 10  CREATININE 6.62* 6.81* QUESTIONABLE RESULTS, RECOMMEND RECOLLECT TO VERIFY 4.90*  CALCIUM 7.6* 7.3* QUESTIONABLE RESULTS, RECOMMEND RECOLLECT TO VERIFY 7.7*  PHOS 3.1 3.2  --   --    Liver Function Tests:  Recent Labs Lab 04/22/17 2002 04/23/17 0502 04/23/17 0759  AST 25  --  22  ALT 10*  --  9*  ALKPHOS 117  --  103  BILITOT 1.1  --  1.2  PROT 7.2  --  7.1   ALBUMIN 4.0 3.8 3.9    Recent Labs Lab 04/22/17 2002  LIPASE 28   CBC:  Recent Labs Lab 04/22/17 2002 04/23/17 0413 04/23/17 0759 04/24/17 0752  WBC 7.7 8.1 7.4 12.9*  HGB 9.8* 9.6* 9.6* 10.8*  HCT 29.3* 30.2* 29.2* 31.5*  MCV 83.0 86.5 83.7 84.0  PLT 57* 58* 56* 71*   Blood Culture    Component Value Date/Time   SDES BLOOD RIGHT FOREARM 02/20/2017 1715   SPECREQUEST  02/20/2017 1715    BOTTLES DRAWN AEROBIC AND ANAEROBIC Blood Culture adequate volume   CULT NO GROWTH 5 DAYS 02/20/2017 1715   REPTSTATUS 02/25/2017 FINAL 02/20/2017 1715    Cardiac Enzymes:  Recent Labs Lab 04/23/17 0759 04/23/17 1635  TROPONINI 0.03* 0.04*   CBG:  Recent Labs Lab 04/24/17 1459 04/24/17 1942 04/24/17 2202 04/25/17 0512 04/25/17 0750  GLUCAP 62* 156* 142* 99 101*   Iron Studies: No results for input(s): IRON, TIBC, TRANSFERRIN, FERRITIN in the last 72 hours. Lab Results  Component Value Date   INR 1.17 03/25/2017   INR >10.00 (HH) 03/24/2017   INR 1.26 02/23/2017   Studies/Results: US Abdomen Limited  Result Date: 04/23/2017 CLINICAL  DATA:  Ascites. EXAM: LIMITED ABDOMEN ULTRASOUND FOR ASCITES TECHNIQUE: Limited ultrasound survey for ascites was performed in all four abdominal quadrants. COMPARISON:  CT of the abdomen and pelvis on 04/22/2017 FINDINGS: In preparation for possible paracentesis, ultrasound demonstrated moderate intraperitoneal ascites which was distributed between bowel loops. A single large enough consistent pocket was not felt to be present to allow paracentesis today. IMPRESSION: Unable to perform paracentesis today due to generalized distribution of ascites without single large pocket. Should there be worsening of ascites clinically, repeat assessment for paracentesis could be performed. Electronically Signed   By: Aletta Edouard M.D.   On: 04/23/2017 14:21   Dg Chest Port 1 View  Result Date: 04/24/2017 CLINICAL DATA:  Shortness of breath. EXAM:  PORTABLE CHEST 1 VIEW COMPARISON:  Radiographs of April 22, 2017. FINDINGS: Stable cardiomegaly is noted. No pneumothorax or pleural effusion is noted. Both lungs are clear. The visualized skeletal structures are unremarkable. IMPRESSION: No acute cardiopulmonary abnormality seen. Electronically Signed   By: Marijo Conception, M.D.   On: 04/24/2017 08:09    Medications: . sodium chloride    . sodium chloride    . sodium chloride    . [START ON 04/27/2017] ferric gluconate (FERRLECIT/NULECIT) IV     . aspirin EC  81 mg Oral Daily  . calcitRIOL  1 mcg Oral Q M,W,F-HD  . cinacalcet  60 mg Oral Q M,W,F-HD  . clopidogrel  75 mg Oral Daily  . lactulose  30 g Oral BID  . metoprolol succinate  25 mg Oral QHS  . nicotine  14 mg Transdermal Daily  . polyethylene glycol  17 g Oral Daily  . sevelamer carbonate  2,400 mg Oral TID WC  . sodium chloride flush  3 mL Intravenous Q12H    Dialysis Orders: [facility] 4h   71.5kg   L AVF  Hep 3000 -mircera 150 last 10/3 -sensipar 120 -calc 1 mg -hep B+ needs isolation  Assessment/Plan: 1. Abdominal distention - ammonia elevated, given lactulose.  2. Metabolic Encephalopathy - agitated and restless during dialysis. ammonia elevated, 63.  Given lactulose. Improved today. Per primary. 2. Resp Distress/hypoxemia- improved w/ volume removal 3. ESRD - MWF patient. Urgent HD yesterday, tolerated well, net UF 4L, post weight 69.9kg.  Patient was agitated and restless on treatment. Resume regular schedule today. Under dry wt today, looks a lot better 4. Anemia of CKD- stable, 10.8. Monitor 5. Secondary hyperparathyroidism - P in goal. Ca low, 7.7. Repeat RFP pre HD. Hold sensipar unitl Ca improved.  6. HTN/volume - Volume improved post HD. Under EDW, continue to titrate down volume as tolerated. BP variable, metoprolol XL changed to qhs, and d/c minoxidil for now.  7. Nutrition - last Alb 3.9 renal/carb modified diet. Renavite.  8. COPD - per primary. 9.  Chronic thrombocytopenia - last 71K, monitor 10. Cirrhosis - s/p Hep C treatment - negative on last labs. +Hep B  [name], PA-C [facility] Pager: [phone] 04/25/2017,11:44 AM  LOS: 2 days   Pt seen, examined, agree w assess/plan as above with additions as indicated.  [name] MD [employer] pager [phone]    cell [phone] 04/25/2017, 1:12 PM

## 2017-04-25 NOTE — Evaluation (Signed)
Occupational Therapy Evaluation Patient Details Name: [name] MRN: [id number] DOB: [birth year] Today's Date: 04/25/2017    History of Present Illness 56 y.o. male admitted with nausea, fatigue, abdominal distention associated with SOB.    Dx:  acute respiratory failure with hypoxia likely due to pulmonary edema contributed by pt's non compliance with HD; acute metabolic encephalopathy; ascites.  PMH includes:   h/o hepatitis B, ETOH abuse, ESRD with HD, COPD, tobacco abuse, h/o polysubstance abuse including cocaine, CAD, metabolic bone disease,     Clinical Impression   Patient evaluated by Occupational Therapy with no further acute OT needs identified. All education has been completed and the patient has no further questions. Pt is mod I with ADLs.  He moves slowly and fatigues requiring rest breaks, but is able to independently self monitor.   See below for any follow-up Occupational Therapy or equipment needs. OT is signing off. Thank you for this referral.      Follow Up Recommendations  No OT follow up;Supervision - Intermittent    Equipment Recommendations  None recommended by OT    Recommendations for Other Services       Precautions / Restrictions Precautions Precautions: None Restrictions Weight Bearing Restrictions: No      Mobility Bed Mobility Overal bed mobility: Independent                Transfers Overall transfer level: Modified independent               General transfer comment: Pt moves slowly     Balance Overall balance assessment: No apparent balance deficits (not formally assessed)                                         ADL either performed or assessed with clinical judgement   ADL Overall ADL's : Modified independent                                             Vision         Perception     Praxis      Pertinent Vitals/Pain Pain Assessment: No/denies pain     Hand Dominance  Right   Extremity/Trunk Assessment Upper Extremity Assessment Upper Extremity Assessment: Overall WFL for tasks assessed   Lower Extremity Assessment Lower Extremity Assessment: Overall WFL for tasks assessed   Cervical / Trunk Assessment Cervical / Trunk Assessment: Normal   Communication Communication Communication: No difficulties   Cognition Arousal/Alertness: Awake/alert Behavior During Therapy: WFL for tasks assessed/performed Overall Cognitive Status: Within Functional Limits for tasks assessed                                     General Comments  Discussed option of tub seat, however, pt is not interested.  he feels he is at his basline.  Encouraged pt to walk with nursing to prevent deconditioning     Exercises     Shoulder Instructions      Home Living Family/patient expects to be discharged to:: Private residence Living Arrangements: Alone Available Help at Discharge: Family;Available PRN/intermittently;Available 24 hours/day Type of Home: Apartment Home Access: Level entry     Home Layout: One level  Bathroom Shower/Tub: Teacher, early years/pre: Standard     Home Equipment: None   Additional Comments: Pt reports his son will be staying with him post op and will assist him PRN       Prior Functioning/Environment Level of Independence: Independent        Comments: Pt reports he does not currently drive.  He relies on others for transportation, or walks.  He reports he fatigues fairly easy with activity.  Denies falls         OT Problem List: Decreased activity tolerance      OT Treatment/Interventions:      OT Goals(Current goals can be found in the care plan section) Acute Rehab OT Goals Patient Stated Goal: to go home and see my cats  OT Goal Formulation: All assessment and education complete, DC therapy  OT Frequency:     Barriers to D/C:            Co-evaluation              AM-PAC PT "6 Clicks"  Daily Activity     Outcome Measure Help from another person eating meals?: None Help from another person taking care of personal grooming?: None Help from another person toileting, which includes using toliet, bedpan, or urinal?: None Help from another person bathing (including washing, rinsing, drying)?: None Help from another person to put on and taking off regular upper body clothing?: None Help from another person to put on and taking off regular lower body clothing?: None 6 Click Score: 24   End of Session Nurse Communication: Mobility status  Activity Tolerance: Patient tolerated treatment well Patient left: in bed;with call bell/phone within reach  OT Visit Diagnosis: Unsteadiness on feet (R26.81)                Time: 7416-3845 OT Time Calculation (min): 25 min Charges:  OT General Charges $OT Visit: 1 Visit OT Evaluation $OT Eval Low Complexity: 1 Low OT Treatments $Therapeutic Activity: 8-22 mins G-Codes:     [facility], OTR/L [phone]   Lucille Passy M 04/25/2017, 12:26 PM

## 2017-04-25 NOTE — Progress Notes (Signed)
[name] to be D/C'd Home per MD order.  Discussed prescriptions and follow up appointments with the patient. Prescriptions given to patient, medication list explained in detail. Pt verbalized understanding.  Allergies as of 04/25/2017      Reactions   Aspirin Other (See Comments)   STOMACH PAIN   Clonidine Derivatives Itching   Tramadol Itching      Medication List    STOP taking these medications   cinacalcet 30 MG tablet Commonly known as:  SENSIPAR   minoxidil 10 MG tablet Commonly known as:  LONITEN     TAKE these medications   acetaminophen 500 MG tablet Commonly known as:  TYLENOL Take 500-1,000 mg by mouth every 6 (six) hours as needed (for headaches and pain).   AEROCHAMBER MV inhaler Use as instructed   albuterol 108 (90 Base) MCG/ACT inhaler Commonly known as:  PROVENTIL HFA;VENTOLIN HFA Inhale 2 puffs into the lungs every 6 (six) hours as needed for wheezing or shortness of breath.   aspirin EC 81 MG tablet Take 81 mg by mouth daily.   budesonide-formoterol 160-4.5 MCG/ACT inhaler Commonly known as:  SYMBICORT Inhale 2 puffs into the lungs 2 (two) times daily.   calcitRIOL 0.25 MCG capsule Commonly known as:  ROCALTROL Take 3 capsules (0.75 mcg total) by mouth every Monday, Wednesday, and Friday with hemodialysis.   clopidogrel 75 MG tablet Commonly known as:  PLAVIX Take 1 tablet (75 mg total) by mouth daily.   gabapentin 100 MG capsule Commonly known as:  NEURONTIN Take 100 mg by mouth up to two times a day   metoprolol succinate 25 MG 24 hr tablet Commonly known as:  TOPROL-XL Take 25 mg by mouth daily.   oxyCODONE 5 MG immediate release tablet Commonly known as:  Oxy IR/ROXICODONE Take 5 mg by mouth 2 (two) times daily as needed for pain.   polyethylene glycol powder powder Commonly known as:  GLYCOLAX/MIRALAX Take 17grams (1 capful) dissolved in at least 8 ounces of water or juice, once daily What changed:  how much to  take  how to take this  when to take this  reasons to take this  additional instructions   sevelamer carbonate 800 MG tablet Commonly known as:  RENVELA Take 3 tablets (2,400 mg total) by mouth 3 (three) times daily with meals.       Vitals:   04/25/17 1700 04/25/17 1727  BP: (!) 167/95 (!) 169/84  Pulse: 74 76  Resp:  15  Temp:  97.8 F (36.6 C)  SpO2:  98%    Skin clean, dry and intact without evidence of skin break down, no evidence of skin tears noted. IV catheter discontinued intact. Site without signs and symptoms of complications. Dressing and pressure applied. Pt denies pain at this time. No complaints noted.  An After Visit Summary was printed and given to the patient. Patient escorted via [place], and D/C home via private auto.  Emilio Math, RN [facility] Phone [phone]

## 2017-04-25 NOTE — Discharge Summary (Signed)
Physician Discharge Summary  [name] VOZ:[id number] DOB: 1960/09/04 DOA: 04/22/2017  PCP: [name], MD  Admit date: 04/22/2017 Discharge date: 04/25/2017  Admitted From:home Disposition:home  Recommendations for Outpatient Follow-up:  1. Follow up with PCP in 1-2 weeks 2. Please obtain BMP/CBC in one week  Home Health:no Equipment/Devices:none Discharge Condition:stable CODE STATUS:full code Diet recommendation:heart healthy  Brief/Interim Summary: 56 year old male with history of hepatitis B, alcohol abuse, ESRD, COPD and tobacco abuse polysubstance abuse including cocaine, GERD, coronary artery disease status post stent placement on 08/14/2018to proximal RCA, history of A. Flutter with aberrancy status post cardioversion 03/24/2017, A flutter induced CVA,presented with nausea, fatigue, abdominal distention associated with shortness of breath. As per prior chart, patient has history of paroxysmal atrial fibrillation with left bundle branch block initially started on Coumadin but has not been compliant with the medication therefore it was discontinued. Patient with history of intermittent refusal to dialysis treatment.  #  Acute respiratory failure with hypoxia likely due to acute pulmonary edema contributed by patient's noncompliance with dialysis treatment:  -significant improvement after hemodialysis treatment. Had ultrafiltration of 4 L yesterday. Patient with no hypoxia. Denied chest pain or shortness of breath. Plan for hemodialysis today.He will be discharged home after the treatment.education provided regarding low salt diet and compliance with t dialysis treatment.  #ESRD on hemodialysis/noncompliance: continue hemodialysis as per nephrology.  # acute metabolic encephalopathy:Mental status improved and at baseline. No focal neurological deficit.  #mild hyperkalemia in ESRD patient: Low potassium diet and dialysis treatment.  # anemia of chronic kidney  disease: Continue IV iron and ESA during dialysis. No sign of active bleeding. Monitor CBC.  #chronic thrombocytopenia: Platelet count is stable. Likely in the setting of alcohol abuse, may have liver disease unknown  #abdomen distention/ascites: IR unable to paracentesis because of generalized distribution of ascites without single large pocket. It is probably because of fluid overload in the setting of chronic noncompliance with hemodialysis treatment and ultrafiltration. Low-salt diet. No sign of infection.abdominal distention improved after dialysis.  #secondary hyperparathyroidism: Sensipar on hold currently because of hypocalcemia. Recommended to follow up at dialysis unit.  #. Coronary artery disease status post stent: Continue aspirin, Plavix, metoprolol. Patient with mild elevation in troponin likely in the setting of ESRD. Echo in August 2018 showed severe LV hypertrophy, EF of 40-45%.  Discharge Diagnoses:  Active Problems:   Chronic hepatitis B (HCC)   Chronic hepatitis C without hepatic coma (HCC)   Thrombocytopenia (HCC)   End stage renal disease (HCC)   Essential hypertension   Atrial flutter (HCC)   Anemia   Ascites   COPD (chronic obstructive pulmonary disease) ([place])   Acute metabolic encephalopathy    Discharge Instructions  Discharge Instructions    Call MD for:  difficulty breathing, headache or visual disturbances    Complete by:  As directed    Call MD for:  extreme fatigue    Complete by:  As directed    Call MD for:  hives    Complete by:  As directed    Call MD for:  persistant dizziness or light-headedness    Complete by:  As directed    Call MD for:  persistant nausea and vomiting    Complete by:  As directed    Call MD for:  severe uncontrolled pain    Complete by:  As directed    Call MD for:  temperature >100.4    Complete by:  As directed    Diet - low sodium  heart healthy    Complete by:  As directed    Increase activity slowly     Complete by:  As directed      Allergies as of 04/25/2017      Reactions   Aspirin Other (See Comments)   STOMACH PAIN   Clonidine Derivatives Itching   Tramadol Itching      Medication List    STOP taking these medications   cinacalcet 30 MG tablet Commonly known as:  SENSIPAR   minoxidil 10 MG tablet Commonly known as:  LONITEN     TAKE these medications   acetaminophen 500 MG tablet Commonly known as:  TYLENOL Take 500-1,000 mg by mouth every 6 (six) hours as needed (for headaches and pain).   AEROCHAMBER MV inhaler Use as instructed   albuterol 108 (90 Base) MCG/ACT inhaler Commonly known as:  PROVENTIL HFA;VENTOLIN HFA Inhale 2 puffs into the lungs every 6 (six) hours as needed for wheezing or shortness of breath.   aspirin EC 81 MG tablet Take 81 mg by mouth daily.   budesonide-formoterol 160-4.5 MCG/ACT inhaler Commonly known as:  SYMBICORT Inhale 2 puffs into the lungs 2 (two) times daily.   calcitRIOL 0.25 MCG capsule Commonly known as:  ROCALTROL Take 3 capsules (0.75 mcg total) by mouth every Monday, Wednesday, and Friday with hemodialysis.   clopidogrel 75 MG tablet Commonly known as:  PLAVIX Take 1 tablet (75 mg total) by mouth daily.   gabapentin 100 MG capsule Commonly known as:  NEURONTIN Take 100 mg by mouth up to two times a day   metoprolol succinate 25 MG 24 hr tablet Commonly known as:  TOPROL-XL Take 25 mg by mouth daily.   oxyCODONE 5 MG immediate release tablet Commonly known as:  Oxy IR/ROXICODONE Take 5 mg by mouth 2 (two) times daily as needed for pain.   polyethylene glycol powder powder Commonly known as:  GLYCOLAX/MIRALAX Take 17grams (1 capful) dissolved in at least 8 ounces of water or juice, once daily What changed:  how much to take  how to take this  when to take this  reasons to take this  additional instructions   sevelamer carbonate 800 MG tablet Commonly known as:  RENVELA Take 3 tablets (2,400 mg  total) by mouth 3 (three) times daily with meals.      Follow-up Information    Osei-Bonsu, [name], MD. Schedule an appointment as soon as possible for a visit in 1 week(s).   Specialty:  Internal Medicine Contact information: [address] [place] [address] [phone]          Allergies  Allergen Reactions  . Aspirin Other (See Comments)    STOMACH PAIN  . Clonidine Derivatives Itching  . Tramadol Itching    Consultations: nephrology  Procedures/Studies: Dialysis, BiPAP  Subjective: Seen and examined at bedside. Reported feeling much better. Denied headache, nausea vomiting chest pain shortness of breath  Discharge Exam: Vitals:   04/25/17 0506 04/25/17 0900  BP: (!) 154/83 124/64  Pulse: 78 75  Resp: 18 18  Temp: 98 F (36.7 C) 98 F (36.7 C)  SpO2: 99% 98%   Vitals:   04/24/17 1459 04/24/17 1944 04/25/17 0506 04/25/17 0900  BP: (!) 158/79 114/63 (!) 154/83 124/64  Pulse: 80 88 78 75  Resp: 18  18 18   Temp: 97.6 F (36.4 C) 98.5 F (36.9 C) 98 F (36.7 C) 98 F (36.7 C)  TempSrc: Axillary Axillary Oral Oral  SpO2: 100%  96% 99% 98%  Weight:      Height:        General: Pt is alert, awake, not in acute distress Cardiovascular: RRR, S1/S2 +, no rubs, no gallops Respiratory: CTA bilaterally, no wheezing, no rhonchi Abdominal: Soft, NT, ND, bowel sounds + Extremities: no edema, no cyanosis    The results of significant diagnostics from this hospitalization (including imaging, microbiology, ancillary and laboratory) are listed below for reference.     Microbiology: Recent Results (from the past 240 hour(s))  MRSA PCR Screening     Status: None   Collection Time: 04/23/17  7:58 AM  Result Value Ref Range Status   MRSA by PCR NEGATIVE NEGATIVE Final    Comment:        The GeneXpert MRSA Assay (FDA approved for NASAL specimens only), is one component of a comprehensive MRSA colonization surveillance program. It is  not intended to diagnose MRSA infection nor to guide or monitor treatment for MRSA infections.      Labs: BNP (last 3 results)  Recent Labs  08/29/16 2259 02/20/17 1706 03/24/17 2050  BNP >4,500.0* 3,193.8* 1,610.9*   Basic Metabolic Panel:  Recent Labs Lab 04/22/17 2002 04/23/17 0502 04/23/17 0759 04/24/17 0724 04/24/17 1518  NA 132* 132* 131* QUESTIONABLE RESULTS, RECOMMEND RECOLLECT TO VERIFY 132*  K 5.3* 5.4* 5.6* QUESTIONABLE RESULTS, RECOMMEND RECOLLECT TO VERIFY 4.3  CL 91* 91* 91* QUESTIONABLE RESULTS, RECOMMEND RECOLLECT TO VERIFY 91*  CO2 30 28 24  QUESTIONABLE RESULTS, RECOMMEND RECOLLECT TO VERIFY 28  GLUCOSE 112* 88 72 QUESTIONABLE RESULTS, RECOMMEND RECOLLECT TO VERIFY 175*  BUN 9 10 12  QUESTIONABLE RESULTS, RECOMMEND RECOLLECT TO VERIFY 10  CREATININE 6.10* 6.62* 6.81* QUESTIONABLE RESULTS, RECOMMEND RECOLLECT TO VERIFY 4.90*  CALCIUM 7.8* 7.6* 7.3* QUESTIONABLE RESULTS, RECOMMEND RECOLLECT TO VERIFY 7.7*  MG  --   --  2.2  --   --   PHOS  --  3.1 3.2  --   --    Liver Function Tests:  Recent Labs Lab 04/22/17 2002 04/23/17 0502 04/23/17 0759  AST 25  --  22  ALT 10*  --  9*  ALKPHOS 117  --  103  BILITOT 1.1  --  1.2  PROT 7.2  --  7.1  ALBUMIN 4.0 3.8 3.9    Recent Labs Lab 04/22/17 2002  LIPASE 28    Recent Labs Lab 04/24/17 0922  AMMONIA 63*   CBC:  Recent Labs Lab 04/22/17 2002 04/23/17 0413 04/23/17 0759 04/24/17 0752  WBC 7.7 8.1 7.4 12.9*  HGB 9.8* 9.6* 9.6* 10.8*  HCT 29.3* 30.2* 29.2* 31.5*  MCV 83.0 86.5 83.7 84.0  PLT 57* 58* 56* 71*   Cardiac Enzymes:  Recent Labs Lab 04/23/17 0759 04/23/17 1635  TROPONINI 0.03* 0.04*   BNP: Invalid input(s): POCBNP CBG:  Recent Labs Lab 04/24/17 1942 04/24/17 2202 04/25/17 0512 04/25/17 0750 04/25/17 1130  GLUCAP 156* 142* 99 101* 131*   D-Dimer No results for input(s): DDIMER in the last 72 hours. Hgb A1c  Recent Labs  04/24/17 1518  HGBA1C 4.6*    Lipid Profile No results for input(s): CHOL, HDL, LDLCALC, TRIG, CHOLHDL, LDLDIRECT in the last 72 hours. Thyroid function studies  Recent Labs  04/23/17 0413  TSH 2.173   Anemia work up No results for input(s): VITAMINB12, FOLATE, FERRITIN, TIBC, IRON, RETICCTPCT in the last 72 hours. Urinalysis    Component Value Date/Time   COLORURINE YELLOW 07/16/2011 [place] 07/16/2011 1619  LABSPEC 1.018 07/16/2011 1619   PHURINE 7.5 07/16/2011 1619   GLUCOSEU 250 (A) 07/16/2011 1619   HGBUR MODERATE (A) 07/16/2011 1619   BILIRUBINUR SMALL (A) 07/16/2011 1619   KETONESUR NEGATIVE 07/16/2011 1619   PROTEINUR >300 (A) 07/16/2011 1619   UROBILINOGEN 1.0 07/16/2011 1619   NITRITE NEGATIVE 07/16/2011 1619   LEUKOCYTESUR SMALL (A) 07/16/2011 1619   Sepsis Labs Invalid input(s): PROCALCITONIN,  WBC,  LACTICIDVEN Microbiology Recent Results (from the past 240 hour(s))  MRSA PCR Screening     Status: None   Collection Time: 04/23/17  7:58 AM  Result Value Ref Range Status   MRSA by PCR NEGATIVE NEGATIVE Final    Comment:        The GeneXpert MRSA Assay (FDA approved for NASAL specimens only), is one component of a comprehensive MRSA colonization surveillance program. It is not intended to diagnose MRSA infection nor to guide or monitor treatment for MRSA infections.      Time coordinating discharge:  30 minutes  SIGNED:   Rosita Fire, MD  [facility] 04/25/2017, 1:02 PM  If 7PM-7AM, please contact night-coverage [email or web address] Password [email or web address]

## 2017-04-26 ENCOUNTER — Encounter: Payer: Self-pay | Admitting: Vascular Surgery

## 2017-04-26 ENCOUNTER — Ambulatory Visit (INDEPENDENT_AMBULATORY_CARE_PROVIDER_SITE_OTHER): Payer: Medicare Other | Admitting: Vascular Surgery

## 2017-04-26 VITALS — BP 69/39 | HR 80 | Temp 97.3°F | Resp 16 | Ht 69.0 in | Wt 161.2 lb

## 2017-04-26 DIAGNOSIS — Z992 Dependence on renal dialysis: Secondary | ICD-10-CM

## 2017-04-26 DIAGNOSIS — N186 End stage renal disease: Secondary | ICD-10-CM | POA: Diagnosis not present

## 2017-04-26 NOTE — Progress Notes (Signed)
[facility] [place]  Patient name: [name] MRN: [id number] DOB: May 14, 1961 Sex: male  REASON FOR VISIT: Evaluation of left upper arm AV fistula  HPI: [name] is a 56 y.o. male here today for evaluation.  There was some concern regarding aneurysmal change of his left upper arm AV fistula.  He had undergone revision of an area that had eroded and bled by Dr. Donzetta Matters in January 2018.  No bleeding and reports that he is having good clearance.  He does feel weaker than usual today and is noted to have a blood pressure in the 50-27 systolic  Past Medical History:  Diagnosis Date  . Adenomatous colon polyp    tubular  . Anemia   . Anxiety   . Arthritis    left shoulder  . Atherosclerosis of aorta ([place])   . Cardiomegaly   . Chest pain    DATE UNKNOWN, C/O PERIODICALLY  . Chronic kidney disease    dialysis - M/W/F  . Cocaine abuse ([place])   . COPD exacerbation ([place]) 08/17/2016  . Dialysis patient ([place])    Monday-Wednesday-Friday  . GERD (gastroesophageal reflux disease)    DATE UNKNOWN  . Hemorrhoids   . Hepatitis B, chronic ([place])   . Hepatitis C   . Hyperkalemia   . Hypertension   . Metabolic bone disease    Patient denies  . Nephrolithiasis   . Pneumonia   . Pulmonary edema   . Renal disorder   . Renal insufficiency   . Shortness of breath dyspnea    " for the last past year with this dialysis"  . Tubular adenoma of colon     Family History  Problem Relation Age of Onset  . Heart disease Mother   . Lung cancer Mother   . Heart disease Father   . Malignant hyperthermia Father   . COPD Father   . Throat cancer Sister   . Esophageal cancer Sister   . Hypertension Other   . COPD Other   . Colon cancer Neg Hx   . Colon polyps Neg Hx   . Rectal cancer Neg Hx   . Stomach cancer Neg Hx     SOCIAL HISTORY: Social History  Substance Use Topics  . Smoking status: Current Every Day Smoker   Packs/day: 0.50    Years: 43.00    Types: Cigarettes    Start date: 08/13/1973  . Smokeless tobacco: Never Used     Comment: Less than 1/2 pk per day  . Alcohol use 2.4 oz/week    1 Glasses of wine, 1 Cans of beer, 1 Shots of liquor, 1 Standard drinks or equivalent per week     Comment: occasional     Allergies  Allergen Reactions  . Aspirin Other (See Comments)    STOMACH PAIN  . Clonidine Derivatives Itching  . Tramadol Itching    Current Outpatient Prescriptions  Medication Sig Dispense Refill  . acetaminophen (TYLENOL) 500 MG tablet Take 500-1,000 mg by mouth every 6 (six) hours as needed (for headaches and pain).    Marland Kitchen albuterol (PROVENTIL HFA;VENTOLIN HFA) 108 (90 Base) MCG/ACT inhaler Inhale 2 puffs into the lungs every 6 (six) hours as needed for wheezing or shortness of breath. 1 Inhaler 6  . aspirin EC 81 MG tablet Take 81 mg by mouth daily.    . budesonide-formoterol (SYMBICORT) 160-4.5 MCG/ACT inhaler Inhale 2 puffs into the lungs 2 (two) times daily.    Marland Kitchen  calcitRIOL (ROCALTROL) 0.25 MCG capsule Take 3 capsules (0.75 mcg total) by mouth every Monday, Wednesday, and Friday with hemodialysis.    Marland Kitchen clopidogrel (PLAVIX) 75 MG tablet Take 1 tablet (75 mg total) by mouth daily. 30 tablet 0  . gabapentin (NEURONTIN) 100 MG capsule Take 100 mg by mouth up to two times a day  0  . metoprolol succinate (TOPROL-XL) 25 MG 24 hr tablet Take 25 mg by mouth daily.  0  . oxyCODONE (OXY IR/ROXICODONE) 5 MG immediate release tablet Take 5 mg by mouth 2 (two) times daily as needed for pain.  0  . polyethylene glycol powder (GLYCOLAX/MIRALAX) powder Take 17grams (1 capful) dissolved in at least 8 ounces of water or juice, once daily (Patient taking differently: Take 0.5 Containers by mouth daily as needed for mild constipation. ) 527 g 1  . sevelamer carbonate (RENVELA) 800 MG tablet Take 3 tablets (2,400 mg total) by mouth 3 (three) times daily with meals.    Marland Kitchen Spacer/Aero-Holding Chambers  (AEROCHAMBER MV) inhaler Use as instructed 1 each 2   No current facility-administered medications for this visit.     REVIEW OF SYSTEMS:  [X]  denotes positive finding, [ ]  denotes negative finding Cardiac  Comments:  Chest pain or chest pressure:    Shortness of breath upon exertion:    Short of breath when lying flat:    Irregular heart rhythm:        Vascular    Pain in calf, thigh, or hip brought on by ambulation:    Pain in feet at night that wakes you up from your sleep:     Blood clot in your veins:    Leg swelling:           PHYSICAL EXAM: Vitals:   04/26/17 0936  BP: (!) 69/39  Pulse: 80  Resp: 16  Temp: (!) 97.3 F (36.3 C)  TempSrc: Oral  SpO2: 92%  Weight: 161 lb 3.2 oz (73.1 kg)  Height: 5\' 9"  (1.753 m)    GENERAL: The patient is a well-nourished male, in no acute distress. The vital signs are documented above. CARDIOVASCULAR: Excellent thrill to his left upper arm AV fistula.  The skin is completely intact and is mobile over the vein with no evidence of risk for bleeding PULMONARY: There is good air exchange  MUSCULOSKELETAL: There are no major deformities or cyanosis. NEUROLOGIC: No focal weakness or paresthesias are detected. SKIN: There are no ulcers or rashes noted. PSYCHIATRIC: The patient has a normal affect.  DATA:  None  MEDICAL ISSUES: Discussed this with the patient.  I do not feel that he has any concerns regarding his AV fistula.  Explained we would not recommend any intervention unless he developed breakdown.  Did discuss his weakness and hypotension with Dr.Sanford.  He will see him tomorrow at the dialysis center.  Suggested that Mr.Kimbell report to the emergency room if he starts having any today.  I discussed this with the patient and we will see him on an as-needed basis    Rosetta Posner, MD [facility] Tel [phone] Pager [phone]

## 2017-04-27 DIAGNOSIS — N2581 Secondary hyperparathyroidism of renal origin: Secondary | ICD-10-CM | POA: Diagnosis not present

## 2017-04-27 DIAGNOSIS — N186 End stage renal disease: Secondary | ICD-10-CM | POA: Diagnosis not present

## 2017-04-27 DIAGNOSIS — D509 Iron deficiency anemia, unspecified: Secondary | ICD-10-CM | POA: Diagnosis not present

## 2017-04-27 DIAGNOSIS — D631 Anemia in chronic kidney disease: Secondary | ICD-10-CM | POA: Diagnosis not present

## 2017-04-28 DIAGNOSIS — Z79891 Long term (current) use of opiate analgesic: Secondary | ICD-10-CM | POA: Diagnosis not present

## 2017-04-28 DIAGNOSIS — M545 Low back pain: Secondary | ICD-10-CM | POA: Diagnosis not present

## 2017-04-28 DIAGNOSIS — G8929 Other chronic pain: Secondary | ICD-10-CM | POA: Diagnosis not present

## 2017-04-28 DIAGNOSIS — M79602 Pain in left arm: Secondary | ICD-10-CM | POA: Diagnosis not present

## 2017-04-29 ENCOUNTER — Encounter (HOSPITAL_COMMUNITY): Payer: Self-pay | Admitting: Emergency Medicine

## 2017-04-29 ENCOUNTER — Emergency Department (HOSPITAL_COMMUNITY)
Admission: EM | Admit: 2017-04-29 | Discharge: 2017-04-29 | Disposition: A | Discharge status: Home / Self Care | Payer: Medicare Other | Attending: Emergency Medicine | Admitting: Emergency Medicine

## 2017-04-29 ENCOUNTER — Encounter (HOSPITAL_COMMUNITY): Payer: Self-pay

## 2017-04-29 ENCOUNTER — Emergency Department (HOSPITAL_COMMUNITY)
Admission: EM | Admit: 2017-04-29 | Discharge: 2017-04-29 | Disposition: A | Discharge status: Home / Self Care | Payer: Medicare Other | Source: Home / Self Care | Attending: Emergency Medicine | Admitting: Emergency Medicine

## 2017-04-29 DIAGNOSIS — F17210 Nicotine dependence, cigarettes, uncomplicated: Secondary | ICD-10-CM | POA: Insufficient documentation

## 2017-04-29 DIAGNOSIS — Z7982 Long term (current) use of aspirin: Secondary | ICD-10-CM

## 2017-04-29 DIAGNOSIS — J449 Chronic obstructive pulmonary disease, unspecified: Secondary | ICD-10-CM | POA: Insufficient documentation

## 2017-04-29 DIAGNOSIS — Y69 Unspecified misadventure during surgical and medical care: Secondary | ICD-10-CM | POA: Insufficient documentation

## 2017-04-29 DIAGNOSIS — Z955 Presence of coronary angioplasty implant and graft: Secondary | ICD-10-CM | POA: Insufficient documentation

## 2017-04-29 DIAGNOSIS — Z7902 Long term (current) use of antithrombotics/antiplatelets: Secondary | ICD-10-CM

## 2017-04-29 DIAGNOSIS — Z79899 Other long term (current) drug therapy: Secondary | ICD-10-CM | POA: Insufficient documentation

## 2017-04-29 DIAGNOSIS — N186 End stage renal disease: Secondary | ICD-10-CM | POA: Insufficient documentation

## 2017-04-29 DIAGNOSIS — D509 Iron deficiency anemia, unspecified: Secondary | ICD-10-CM | POA: Diagnosis not present

## 2017-04-29 DIAGNOSIS — Z992 Dependence on renal dialysis: Secondary | ICD-10-CM

## 2017-04-29 DIAGNOSIS — T82838D Hemorrhage of vascular prosthetic devices, implants and grafts, subsequent encounter: Secondary | ICD-10-CM

## 2017-04-29 DIAGNOSIS — T82838A Hemorrhage of vascular prosthetic devices, implants and grafts, initial encounter: Secondary | ICD-10-CM | POA: Diagnosis not present

## 2017-04-29 DIAGNOSIS — I120 Hypertensive chronic kidney disease with stage 5 chronic kidney disease or end stage renal disease: Secondary | ICD-10-CM

## 2017-04-29 DIAGNOSIS — R58 Hemorrhage, not elsewhere classified: Secondary | ICD-10-CM | POA: Diagnosis not present

## 2017-04-29 DIAGNOSIS — N2581 Secondary hyperparathyroidism of renal origin: Secondary | ICD-10-CM | POA: Diagnosis not present

## 2017-04-29 DIAGNOSIS — R030 Elevated blood-pressure reading, without diagnosis of hypertension: Secondary | ICD-10-CM | POA: Diagnosis not present

## 2017-04-29 DIAGNOSIS — Y658 Other specified misadventures during surgical and medical care: Secondary | ICD-10-CM | POA: Insufficient documentation

## 2017-04-29 DIAGNOSIS — D631 Anemia in chronic kidney disease: Secondary | ICD-10-CM | POA: Diagnosis not present

## 2017-04-29 NOTE — ED Notes (Signed)
Pt stable, ambulatory, states understanding of discharge instructions.  Pt unbandaging arm at front door states its too tight.

## 2017-04-29 NOTE — ED Notes (Signed)
PT demanding a cup of coffee and his discharge paperwork. Pt states there is nothing wrong with him.

## 2017-04-29 NOTE — ED Notes (Signed)
Patient stated he just was to have his arm wrapped up with gauze and go home.  Patient instructed that he needs to wait to have the bleeding stop and be seen by provider to clear him to go home.  Hemostat still in place, small area of blood noticed below patient.

## 2017-04-29 NOTE — ED Notes (Signed)
edp provided with hemostat

## 2017-04-29 NOTE — ED Triage Notes (Signed)
Here to have bandage rewrapped, bleeding started again when patient was leaving.

## 2017-04-29 NOTE — ED Triage Notes (Signed)
Per [facility], pt is coming from dialysis treatment earlier today with a bleeding fistula. A clamp was applied to stop the bleeding. Pt is a&O x4.  170/100 100 16

## 2017-04-29 NOTE — ED Provider Notes (Signed)
[facility] EMERGENCY DEPARTMENT Provider Note   CSN: [id number] Arrival date & time: 04/29/17  1644     History   Chief Complaint Chief Complaint  Patient presents with  . Vascular Access Problem    HPI [name] is a 56 y.o. male.  56 year old history of ESRD on dialysis who was evaluated earlier today for bleeding from LUE shunt site.  He presents with concern for additional bleeding from shunt site.  On arrival, no active bleeding.  Patient states the combat gauze was wrapped too tight earlier. He attempted to loosen the wrapping and it began bleeding slightly. Currently wrapped and hemostatic. Patient requests re-wrapping. No additional symptoms.  The history is provided by the patient and medical records. No language interpreter was used.    Past Medical History:  Diagnosis Date  . Adenomatous colon polyp    tubular  . Anemia   . Anxiety   . Arthritis    left shoulder  . Atherosclerosis of aorta ([place])   . Cardiomegaly   . Chest pain    DATE UNKNOWN, C/O PERIODICALLY  . Chronic kidney disease    dialysis - M/W/F  . Cocaine abuse ([place])   . COPD exacerbation ([place]) 08/17/2016  . Dialysis patient ([place])    Monday-Wednesday-Friday  . GERD (gastroesophageal reflux disease)    DATE UNKNOWN  . Hemorrhoids   . Hepatitis B, chronic ([place])   . Hepatitis C   . Hyperkalemia   . Hypertension   . Metabolic bone disease    Patient denies  . Nephrolithiasis   . Pneumonia   . Pulmonary edema   . Renal disorder   . Renal insufficiency   . Shortness of breath dyspnea    " for the last past year with this dialysis"  . Tubular adenoma of colon     Patient Active Problem List   Diagnosis Date Noted  . Acute metabolic encephalopathy   . Anemia 04/23/2017  . Ascites 04/23/2017  . COPD (chronic obstructive pulmonary disease) ([place]) 04/23/2017  . Acute on chronic respiratory failure with hypoxia ([place]) 03/25/2017  . Atrial flutter ([place]) 03/25/2017  .  Arrhythmia 03/25/2017  . COPD GOLD II/ still smoking 09/27/2016  . Essential hypertension 09/27/2016  . Fluid overload 08/30/2016  . COPD exacerbation ([place]) 08/17/2016  . Hypertensive urgency 08/17/2016  . Respiratory failure ([place]) 08/17/2016  . Problem with dialysis access ([place]) 07/23/2016  . End stage renal disease ([place]) 12/02/2015  . Chronic hepatitis B ([place]) 03/05/2014  . Chronic hepatitis C without hepatic coma ([place]) 03/05/2014  . Internal hemorrhoids with bleeding, swelling and itching 03/05/2014  . Thrombocytopenia ([place]) 03/05/2014  . Chest pain 02/27/2014  . Alcohol abuse 04/14/2009  . Cigarette smoker 04/14/2009  . GANGLION CYST 04/14/2009    Past Surgical History:  Procedure Laterality Date  . AV FISTULA PLACEMENT  2012   BELIEVED WAS PLACED IN JUNE  . COLONOSCOPY    . CORONARY STENT INTERVENTION N/A 02/22/2017   Procedure: CORONARY STENT INTERVENTION;  Surgeon: Nigel Mormon, MD;  Location: [facility];  Service: Cardiovascular;  Laterality: N/A;  . HEMORRHOID BANDING    . LEFT HEART CATH AND CORONARY ANGIOGRAPHY N/A 02/22/2017   Procedure: LEFT HEART CATH AND CORONARY ANGIOGRAPHY;  Surgeon: Nigel Mormon, MD;  Location: [facility] LAB;  Service: Cardiovascular;  Laterality: N/A;  . LIGATION OF ARTERIOVENOUS  FISTULA Left 0/09/90   Procedure: Plication of Left Arm Arteriovenous Fistula;  Surgeon: Jessy Oto  Fields, MD;  Location: [facility];  Service: Vascular;  Laterality: Left;  . POLYPECTOMY    . REVISON OF ARTERIOVENOUS FISTULA Left 2/29/7989   Procedure: PLICATION OF DISTAL ANEURYSMAL SEGEMENT OF LEFT UPPER ARM ARTERIOVENOUS FISTULA;  Surgeon: Elam Dutch, MD;  Location: [facility];  Service: Vascular;  Laterality: Left;  . REVISON OF ARTERIOVENOUS FISTULA Left 08/23/9415   Procedure: Plication of Left Upper Arm Fistula ;  Surgeon: Waynetta Sandy, MD;  Location[facility] OR;  Service: Vascular;  Laterality: Left;       Home Medications     Prior to Admission medications   Medication Sig Start Date End Date Taking? Authorizing Provider  acetaminophen (TYLENOL) 500 MG tablet Take 500-1,000 mg by mouth every 6 (six) hours as needed (for headaches and pain).    [provider]  albuterol (PROVENTIL HFA;VENTOLIN HFA) 108 (90 Base) MCG/ACT inhaler Inhale 2 puffs into the lungs every 6 (six) hours as needed for wheezing or shortness of breath. 03/10/17   Javier Glazier, MD  aspirin EC 81 MG tablet Take 81 mg by mouth daily.    [provider]  budesonide-formoterol (SYMBICORT) 160-4.5 MCG/ACT inhaler Inhale 2 puffs into the lungs 2 (two) times daily.    [provider]  calcitRIOL (ROCALTROL) 0.25 MCG capsule Take 3 capsules (0.75 mcg total) by mouth every Monday, Wednesday, and Friday with hemodialysis. 03/28/17   Cherene Altes, MD  clopidogrel (PLAVIX) 75 MG tablet Take 1 tablet (75 mg total) by mouth daily. 02/24/17   Velvet Bathe, MD  gabapentin (NEURONTIN) 100 MG capsule Take 100 mg by mouth up to two times a day 01/06/17   [provider]  metoprolol succinate (TOPROL-XL) 25 MG 24 hr tablet Take 25 mg by mouth daily. 02/23/17   [provider]  oxyCODONE (OXY IR/ROXICODONE) 5 MG immediate release tablet Take 5 mg by mouth 2 (two) times daily as needed for pain. 02/03/17   [provider]  polyethylene glycol powder (GLYCOLAX/MIRALAX) powder Take 17grams (1 capful) dissolved in at least 8 ounces of water or juice, once daily Patient taking differently: Take 0.5 Containers by mouth daily as needed for mild constipation.  01/13/17   Pyrtle, Lajuan Lines, MD  sevelamer carbonate (RENVELA) 800 MG tablet Take 3 tablets (2,400 mg total) by mouth 3 (three) times daily with meals. 03/26/17   Cherene Altes, MD  Spacer/Aero-Holding Chambers (AEROCHAMBER MV) inhaler Use as instructed 03/10/17   Javier Glazier, MD    Family History Family History  Problem Relation Age of Onset  . Heart  disease Mother   . Lung cancer Mother   . Heart disease Father   . Malignant hyperthermia Father   . COPD Father   . Throat cancer Sister   . Esophageal cancer Sister   . Hypertension Other   . COPD Other   . Colon cancer Neg Hx   . Colon polyps Neg Hx   . Rectal cancer Neg Hx   . Stomach cancer Neg Hx     Social History Social History  Substance Use Topics  . Smoking status: Current Every Day Smoker    Packs/day: 0.50    Years: 43.00    Types: Cigarettes    Start date: 08/13/1973  . Smokeless tobacco: Never Used     Comment: Less than 1/2 pk per day  . Alcohol use 2.4 oz/week    1 Glasses of wine, 1 Cans of beer, 1 Shots of liquor, 1 Standard  drinks or equivalent per week     Comment: occasional      Allergies   Aspirin; Clonidine derivatives; and Tramadol   Review of Systems Review of Systems  Constitutional: Negative for chills and fever.  HENT: Negative for ear pain and sore throat.   Eyes: Negative for pain and visual disturbance.  Respiratory: Negative for cough and shortness of breath.   Cardiovascular: Negative for chest pain and palpitations.  Gastrointestinal: Negative for abdominal pain and vomiting.  Genitourinary: Negative for dysuria and hematuria.  Musculoskeletal: Negative for arthralgias and back pain.  Skin: Positive for wound. Negative for color change and rash.  Neurological: Negative for seizures and syncope.  All other systems reviewed and are negative.    Physical Exam Updated Vital Signs There were no vitals taken for this visit.  Physical Exam  Constitutional: He appears well-developed.  HENT:  Head: Normocephalic and atraumatic.  Eyes: Conjunctivae are normal.  Neck: Neck supple.  Cardiovascular: Normal rate and regular rhythm.   No murmur heard. Pulmonary/Chest: Effort normal and breath sounds normal. No respiratory distress.  Abdominal: Soft. There is no tenderness.  Musculoskeletal: He exhibits no edema.  LUE fistula with  palpable thrill. Currently hemostatic but with blood stained overlying dressing  Neurological: He is alert. No cranial nerve deficit. Coordination normal.  Moves all extremities  Skin: Skin is warm and dry.  Nursing note and vitals reviewed.    ED Treatments / Results  Labs (all labs ordered are listed, but only abnormal results are displayed) Labs Reviewed - No data to display  EKG  EKG Interpretation None       Radiology No results found.  Procedures Procedures (including critical care time)  Medications Ordered in ED Medications - No data to display   Initial Impression / Assessment and Plan / ED Course  I have reviewed the triage vital signs and the nursing notes.  Pertinent labs & imaging results that were available during my care of the patient were reviewed by me and considered in my medical decision making (see chart for details).     60 yoM h/o ESRD on HD who presents for continued bleeding from dialysis shunt. Wrapped with combat gauze earlier. Patient was discharged and removed dressing due to feeling to too tight; began to have small bleeding. Currently hemostatic with bloody overlying dressing.  Dressing re-wrapped. Remains hemostatic. Pt stable for continued outpatient f/u.  Return precautions provided for worsening symptoms. Pt will f/u with PCP at first availability. Pt verbalized agreement with plan.  Pt care d/w Dr. Jeneen Rinks  Final Clinical Impressions(s) / ED Diagnoses   Final diagnoses:  Bleeding from dialysis shunt, subsequent encounter    New Prescriptions Discharge Medication List as of 04/29/2017  5:28 PM       Tynan Boesel, Pilar Plate, MD 04/30/17 1147    Tanna Furry, MD 05/13/17 [phone]

## 2017-04-29 NOTE — Discharge Instructions (Signed)
Please keep gauze on for 24 hours. Return to ED for continued bleeding or worsening symptoms.

## 2017-04-29 NOTE — Discharge Instructions (Signed)
Please keep bandage on for 24 hours before removal. Please followup with primary doctor at first availability.

## 2017-04-30 NOTE — ED Provider Notes (Signed)
[facility] EMERGENCY DEPARTMENT Provider Note   CSN: [id number] Arrival date & time: 04/29/17  1400     History   Chief Complaint Chief Complaint  Patient presents with  . Vascular Access Problem    HPI [name] is a 56 y.o. male.  56 yo h/o ESRD on HD who p/w bleeding from dialysis fistula accessed earlier today. Currently with clamp and dressing in place. Pt denies other symptoms or pain. Takes plavix and ASA.  The history is provided by the patient and medical records. No language interpreter was used.    Past Medical History:  Diagnosis Date  . Adenomatous colon polyp    tubular  . Anemia   . Anxiety   . Arthritis    left shoulder  . Atherosclerosis of aorta ([place])   . Cardiomegaly   . Chest pain    DATE UNKNOWN, C/O PERIODICALLY  . Chronic kidney disease    dialysis - M/W/F  . Cocaine abuse ([place])   . COPD exacerbation ([place]) 08/17/2016  . Dialysis patient ([place])    Monday-Wednesday-Friday  . GERD (gastroesophageal reflux disease)    DATE UNKNOWN  . Hemorrhoids   . Hepatitis B, chronic ([place])   . Hepatitis C   . Hyperkalemia   . Hypertension   . Metabolic bone disease    Patient denies  . Nephrolithiasis   . Pneumonia   . Pulmonary edema   . Renal disorder   . Renal insufficiency   . Shortness of breath dyspnea    " for the last past year with this dialysis"  . Tubular adenoma of colon     Patient Active Problem List   Diagnosis Date Noted  . Acute metabolic encephalopathy   . Anemia 04/23/2017  . Ascites 04/23/2017  . COPD (chronic obstructive pulmonary disease) ([place]) 04/23/2017  . Acute on chronic respiratory failure with hypoxia ([place]) 03/25/2017  . Atrial flutter ([place]) 03/25/2017  . Arrhythmia 03/25/2017  . COPD GOLD II/ still smoking 09/27/2016  . Essential hypertension 09/27/2016  . Fluid overload 08/30/2016  . COPD exacerbation ([place]) 08/17/2016  . Hypertensive urgency 08/17/2016  . Respiratory failure ([place])  08/17/2016  . Problem with dialysis access ([place]) 07/23/2016  . End stage renal disease ([place]) 12/02/2015  . Chronic hepatitis B ([place]) 03/05/2014  . Chronic hepatitis C without hepatic coma ([place]) 03/05/2014  . Internal hemorrhoids with bleeding, swelling and itching 03/05/2014  . Thrombocytopenia ([place]) 03/05/2014  . Chest pain 02/27/2014  . Alcohol abuse 04/14/2009  . Cigarette smoker 04/14/2009  . GANGLION CYST 04/14/2009    Past Surgical History:  Procedure Laterality Date  . AV FISTULA PLACEMENT  2012   BELIEVED WAS PLACED IN JUNE  . COLONOSCOPY    . CORONARY STENT INTERVENTION N/A 02/22/2017   Procedure: CORONARY STENT INTERVENTION;  Surgeon: Nigel Mormon, MD;  Location: [facility];  Service: Cardiovascular;  Laterality: N/A;  . HEMORRHOID BANDING    . LEFT HEART CATH AND CORONARY ANGIOGRAPHY N/A 02/22/2017   Procedure: LEFT HEART CATH AND CORONARY ANGIOGRAPHY;  Surgeon: Nigel Mormon, MD;  Location: [facility] CV LAB;  Service: Cardiovascular;  Laterality: N/A;  . LIGATION OF ARTERIOVENOUS  FISTULA Left 12/15/628   Procedure: Plication of Left Arm Arteriovenous Fistula;  Surgeon: Elam Dutch, MD;  Location: [facility];  Service: Vascular;  Laterality: Left;  . POLYPECTOMY    . REVISON OF ARTERIOVENOUS FISTULA Left 1/60/1093   Procedure: PLICATION OF DISTAL ANEURYSMAL SEGEMENT OF  LEFT UPPER ARM ARTERIOVENOUS FISTULA;  Surgeon: Elam Dutch, MD;  Location: [facility];  Service: Vascular;  Laterality: Left;  . REVISON OF ARTERIOVENOUS FISTULA Left 5/63/8756   Procedure: Plication of Left Upper Arm Fistula ;  Surgeon: Waynetta Sandy, MD;  Location: [facility] OR;  Service: Vascular;  Laterality: Left;       Home Medications    Prior to Admission medications   Medication Sig Start Date End Date Taking? Authorizing Provider  acetaminophen (TYLENOL) 500 MG tablet Take 500-1,000 mg by mouth every 6 (six) hours as needed (for headaches and pain).    [provider]  albuterol (PROVENTIL HFA;VENTOLIN HFA) 108 (90 Base) MCG/ACT inhaler Inhale 2 puffs into the lungs every 6 (six) hours as needed for wheezing or shortness of breath. 03/10/17   Javier Glazier, MD  aspirin EC 81 MG tablet Take 81 mg by mouth daily.    [provider]  budesonide-formoterol (SYMBICORT) 160-4.5 MCG/ACT inhaler Inhale 2 puffs into the lungs 2 (two) times daily.    [provider]  calcitRIOL (ROCALTROL) 0.25 MCG capsule Take 3 capsules (0.75 mcg total) by mouth every Monday, Wednesday, and Friday with hemodialysis. 03/28/17   Cherene Altes, MD  clopidogrel (PLAVIX) 75 MG tablet Take 1 tablet (75 mg total) by mouth daily. 02/24/17   Velvet Bathe, MD  gabapentin (NEURONTIN) 100 MG capsule Take 100 mg by mouth up to two times a day 01/06/17   [provider]  metoprolol succinate (TOPROL-XL) 25 MG 24 hr tablet Take 25 mg by mouth daily. 02/23/17   [provider]  oxyCODONE (OXY IR/ROXICODONE) 5 MG immediate release tablet Take 5 mg by mouth 2 (two) times daily as needed for pain. 02/03/17   [provider]  polyethylene glycol powder (GLYCOLAX/MIRALAX) powder Take 17grams (1 capful) dissolved in at least 8 ounces of water or juice, once daily Patient taking differently: Take 0.5 Containers by mouth daily as needed for mild constipation.  01/13/17   Pyrtle, Lajuan Lines, MD  sevelamer carbonate (RENVELA) 800 MG tablet Take 3 tablets (2,400 mg total) by mouth 3 (three) times daily with meals. 03/26/17   Cherene Altes, MD  Spacer/Aero-Holding Chambers (AEROCHAMBER MV) inhaler Use as instructed 03/10/17   Javier Glazier, MD    Family History Family History  Problem Relation Age of Onset  . Heart disease Mother   . Lung cancer Mother   . Heart disease Father   . Malignant hyperthermia Father   . COPD Father   . Throat cancer Sister   . Esophageal cancer Sister   . Hypertension Other   . COPD Other   . Colon cancer Neg Hx    . Colon polyps Neg Hx   . Rectal cancer Neg Hx   . Stomach cancer Neg Hx     Social History Social History  Substance Use Topics  . Smoking status: Current Every Day Smoker    Packs/day: 0.50    Years: 43.00    Types: Cigarettes    Start date: 08/13/1973  . Smokeless tobacco: Never Used     Comment: Less than 1/2 pk per day  . Alcohol use 2.4 oz/week    1 Glasses of wine, 1 Cans of beer, 1 Shots of liquor, 1 Standard drinks or equivalent per week     Comment: occasional      Allergies   Aspirin; Clonidine derivatives; and Tramadol   Review of Systems Review of Systems  Constitutional:  Negative for chills and fever.  HENT: Negative for ear pain and sore throat.   Eyes: Negative for pain and visual disturbance.  Respiratory: Negative for cough and shortness of breath.   Cardiovascular: Negative for chest pain and palpitations.  Gastrointestinal: Negative for abdominal pain and vomiting.  Genitourinary: Negative for dysuria and hematuria.  Musculoskeletal: Negative for arthralgias and back pain.  Skin: Positive for wound. Negative for color change and rash.  Neurological: Negative for seizures and syncope.  Hematological: Bruises/bleeds easily (dialysis fistula).  All other systems reviewed and are negative.    Physical Exam Updated Vital Signs BP (!) 158/96 (BP Location: Right Arm)   Pulse 100   Resp 16   Ht 5\' 9"  (1.753 m)   Wt 74.8 kg (165 lb)   SpO2 99%   BMI 24.37 kg/m   Physical Exam  Constitutional: He appears well-developed.  HENT:  Head: Normocephalic and atraumatic.  Eyes: Conjunctivae are normal.  Neck: Neck supple.  Cardiovascular: Normal rate and regular rhythm.   No murmur heard. Pulmonary/Chest: Effort normal and breath sounds normal. No respiratory distress.  Abdominal: Soft. There is no tenderness.  Musculoskeletal: He exhibits no edema.  Clamp and dressing over LUE fistula. Small oozing with removal of dressing.  Neurological: He is  alert. No cranial nerve deficit. Coordination normal.  Moves all extremities  Skin: Skin is warm and dry.  Nursing note and vitals reviewed.    ED Treatments / Results  Labs (all labs ordered are listed, but only abnormal results are displayed) Labs Reviewed - No data to display  EKG  EKG Interpretation None       Radiology No results found.  Procedures Procedures (including critical care time)  Medications Ordered in ED Medications - No data to display   Initial Impression / Assessment and Plan / ED Course  I have reviewed the triage vital signs and the nursing notes.  Pertinent labs & imaging results that were available during my care of the patient were reviewed by me and considered in my medical decision making (see chart for details).     56 yo h/o ESRD on HD who p/w bleeding from dialysis fistula accessed earlier today. Currently with clamp and dressing in place. Pt denies other symptoms or pain.  Small bleeding after removal of overlying dressing. Fistula with palpable thrill.  Combat gauze applied and wrapped with coband. Hemostatic on reassessment. Pt stable for continued outpatient followup.  Return precautions provided for worsening symptoms. Pt will f/u with PCP at first availability. Pt verbalized agreement with plan.  Pt care d/w Dr. Jeanell Sparrow  Final Clinical Impressions(s) / ED Diagnoses   Final diagnoses:  Bleeding from dialysis shunt, initial encounter [facility])    New Prescriptions Discharge Medication List as of 04/29/2017  4:14 PM       Payton Emerald, MD 04/30/17 1217    Pattricia Boss, MD 05/04/17 1132

## 2017-05-02 ENCOUNTER — Encounter (HOSPITAL_COMMUNITY): Payer: Self-pay | Admitting: Oncology

## 2017-05-02 ENCOUNTER — Emergency Department (HOSPITAL_COMMUNITY)
Admission: EM | Admit: 2017-05-02 | Discharge: 2017-05-02 | Disposition: A | Discharge status: Home / Self Care | Payer: Medicare Other | Attending: Emergency Medicine | Admitting: Emergency Medicine

## 2017-05-02 ENCOUNTER — Emergency Department (HOSPITAL_COMMUNITY): Payer: Medicare Other

## 2017-05-02 DIAGNOSIS — J449 Chronic obstructive pulmonary disease, unspecified: Secondary | ICD-10-CM | POA: Insufficient documentation

## 2017-05-02 DIAGNOSIS — I120 Hypertensive chronic kidney disease with stage 5 chronic kidney disease or end stage renal disease: Secondary | ICD-10-CM | POA: Insufficient documentation

## 2017-05-02 DIAGNOSIS — F17210 Nicotine dependence, cigarettes, uncomplicated: Secondary | ICD-10-CM | POA: Insufficient documentation

## 2017-05-02 DIAGNOSIS — Z7902 Long term (current) use of antithrombotics/antiplatelets: Secondary | ICD-10-CM | POA: Insufficient documentation

## 2017-05-02 DIAGNOSIS — Z7982 Long term (current) use of aspirin: Secondary | ICD-10-CM | POA: Diagnosis not present

## 2017-05-02 DIAGNOSIS — Z992 Dependence on renal dialysis: Secondary | ICD-10-CM | POA: Insufficient documentation

## 2017-05-02 DIAGNOSIS — Z79899 Other long term (current) drug therapy: Secondary | ICD-10-CM | POA: Diagnosis not present

## 2017-05-02 DIAGNOSIS — R0602 Shortness of breath: Secondary | ICD-10-CM | POA: Diagnosis not present

## 2017-05-02 DIAGNOSIS — N186 End stage renal disease: Secondary | ICD-10-CM | POA: Insufficient documentation

## 2017-05-02 DIAGNOSIS — R05 Cough: Secondary | ICD-10-CM | POA: Diagnosis not present

## 2017-05-02 DIAGNOSIS — R000 Tachycardia, unspecified: Secondary | ICD-10-CM | POA: Diagnosis not present

## 2017-05-02 MED ORDER — LOPERAMIDE HCL 2 MG PO CAPS
4.0000 mg | ORAL_CAPSULE | Freq: Once | ORAL | Status: AC
  Administered 2017-05-02: 4 mg via ORAL
  Filled 2017-05-02: qty 2

## 2017-05-02 NOTE — ED Provider Notes (Signed)
[facility] EMERGENCY DEPARTMENT Provider Note   CSN: [id number] Arrival date & time: 05/02/17  0451     History   Chief Complaint Chief Complaint  Patient presents with  . Needs Dialysis    HPI [name] is a 56 y.o. male.  The history is provided by the patient.  He has end-stage renal disease and receives dialysis every Monday-Wednesday-Friday.  This morning, he had dyspnea which developed and he did not feel comfortable going to his dialysis.  He is scheduled to start at 6:30 AM.  He denies chest pain, heaviness, tightness, pressure.  He denies fever or chills.  Denies nausea or vomiting.  He states he is not sure if he may have eaten something with too much salt.  Past Medical History:  Diagnosis Date  . Adenomatous colon polyp    tubular  . Anemia   . Anxiety   . Arthritis    left shoulder  . Atherosclerosis of aorta ([place])   . Cardiomegaly   . Chest pain    DATE UNKNOWN, C/O PERIODICALLY  . Chronic kidney disease    dialysis - M/W/F  . Cocaine abuse ([place])   . COPD exacerbation ([place]) 08/17/2016  . Dialysis patient ([place])    Monday-Wednesday-Friday  . GERD (gastroesophageal reflux disease)    DATE UNKNOWN  . Hemorrhoids   . Hepatitis B, chronic ([place])   . Hepatitis C   . Hyperkalemia   . Hypertension   . Metabolic bone disease    Patient denies  . Nephrolithiasis   . Pneumonia   . Pulmonary edema   . Renal disorder   . Renal insufficiency   . Shortness of breath dyspnea    " for the last past year with this dialysis"  . Tubular adenoma of colon     Patient Active Problem List   Diagnosis Date Noted  . Acute metabolic encephalopathy   . Anemia 04/23/2017  . Ascites 04/23/2017  . COPD (chronic obstructive pulmonary disease) ([place]) 04/23/2017  . Acute on chronic respiratory failure with hypoxia ([place]) 03/25/2017  . Atrial flutter ([place]) 03/25/2017  . Arrhythmia 03/25/2017  . COPD GOLD II/ still smoking 09/27/2016  . Essential  hypertension 09/27/2016  . Fluid overload 08/30/2016  . COPD exacerbation ([place]) 08/17/2016  . Hypertensive urgency 08/17/2016  . Respiratory failure ([place]) 08/17/2016  . Problem with dialysis access ([place]) 07/23/2016  . End stage renal disease ([place]) 12/02/2015  . Chronic hepatitis B ([place]) 03/05/2014  . Chronic hepatitis C without hepatic coma ([place]) 03/05/2014  . Internal hemorrhoids with bleeding, swelling and itching 03/05/2014  . Thrombocytopenia ([place]) 03/05/2014  . Chest pain 02/27/2014  . Alcohol abuse 04/14/2009  . Cigarette smoker 04/14/2009  . GANGLION CYST 04/14/2009    Past Surgical History:  Procedure Laterality Date  . AV FISTULA PLACEMENT  2012   BELIEVED WAS PLACED IN JUNE  . COLONOSCOPY    . CORONARY STENT INTERVENTION N/A 02/22/2017   Procedure: CORONARY STENT INTERVENTION;  Surgeon: Nigel Mormon, MD;  Location: [facility];  Service: Cardiovascular;  Laterality: N/A;  . HEMORRHOID BANDING    . LEFT HEART CATH AND CORONARY ANGIOGRAPHY N/A 02/22/2017   Procedure: LEFT HEART CATH AND CORONARY ANGIOGRAPHY;  Surgeon: Nigel Mormon, MD;  Location: [facility];  Service: Cardiovascular;  Laterality: N/A;  . LIGATION OF ARTERIOVENOUS  FISTULA Left 0/09/5595   Procedure: Plication of Left Arm Arteriovenous Fistula;  Surgeon: Elam Dutch, MD;  Location:  [facility] OR;  Service: Vascular;  Laterality: Left;  . POLYPECTOMY    . REVISON OF ARTERIOVENOUS FISTULA Left 4/33/2951   Procedure: PLICATION OF DISTAL ANEURYSMAL SEGEMENT OF LEFT UPPER ARM ARTERIOVENOUS FISTULA;  Surgeon: Elam Dutch, MD;  Location: [facility];  Service: Vascular;  Laterality: Left;  . REVISON OF ARTERIOVENOUS FISTULA Left 8/84/1660   Procedure: Plication of Left Upper Arm Fistula ;  Surgeon: Waynetta Sandy, MD;  Location: [facility] OR;  Service: Vascular;  Laterality: Left;       Home Medications    Prior to Admission medications   Medication Sig Start Date End Date Taking?  Authorizing Provider  acetaminophen (TYLENOL) 500 MG tablet Take 500-1,000 mg by mouth every 6 (six) hours as needed (for headaches and pain).    [provider]  albuterol (PROVENTIL HFA;VENTOLIN HFA) 108 (90 Base) MCG/ACT inhaler Inhale 2 puffs into the lungs every 6 (six) hours as needed for wheezing or shortness of breath. 03/10/17   Javier Glazier, MD  aspirin EC 81 MG tablet Take 81 mg by mouth daily.    [provider]  budesonide-formoterol (SYMBICORT) 160-4.5 MCG/ACT inhaler Inhale 2 puffs into the lungs 2 (two) times daily.    [provider]  calcitRIOL (ROCALTROL) 0.25 MCG capsule Take 3 capsules (0.75 mcg total) by mouth every Monday, Wednesday, and Friday with hemodialysis. 03/28/17   Cherene Altes, MD  clopidogrel (PLAVIX) 75 MG tablet Take 1 tablet (75 mg total) by mouth daily. 02/24/17   Velvet Bathe, MD  gabapentin (NEURONTIN) 100 MG capsule Take 100 mg by mouth up to two times a day 01/06/17   [provider]  metoprolol succinate (TOPROL-XL) 25 MG 24 hr tablet Take 25 mg by mouth daily. 02/23/17   [provider]  oxyCODONE (OXY IR/ROXICODONE) 5 MG immediate release tablet Take 5 mg by mouth 2 (two) times daily as needed for pain. 02/03/17   [provider]  polyethylene glycol powder (GLYCOLAX/MIRALAX) powder Take 17grams (1 capful) dissolved in at least 8 ounces of water or juice, once daily Patient taking differently: Take 0.5 Containers by mouth daily as needed for mild constipation.  01/13/17   Pyrtle, Lajuan Lines, MD  sevelamer carbonate (RENVELA) 800 MG tablet Take 3 tablets (2,400 mg total) by mouth 3 (three) times daily with meals. 03/26/17   Cherene Altes, MD  Spacer/Aero-Holding Chambers (AEROCHAMBER MV) inhaler Use as instructed 03/10/17   Javier Glazier, MD    Family History Family History  Problem Relation Age of Onset  . Heart disease Mother   . Lung cancer Mother   . Heart disease Father   . Malignant  hyperthermia Father   . COPD Father   . Throat cancer Sister   . Esophageal cancer Sister   . Hypertension Other   . COPD Other   . Colon cancer Neg Hx   . Colon polyps Neg Hx   . Rectal cancer Neg Hx   . Stomach cancer Neg Hx     Social History Social History  Substance Use Topics  . Smoking status: Current Every Day Smoker    Packs/day: 0.50    Years: 43.00    Types: Cigarettes    Start date: 08/13/1973  . Smokeless tobacco: Never Used     Comment: Less than 1/2 pk per day  . Alcohol use 2.4 oz/week    1 Glasses of wine, 1 Cans of beer, 1 Shots of liquor, 1 Standard drinks or equivalent per  week     Comment: occasional      Allergies   Aspirin; Clonidine derivatives; and Tramadol   Review of Systems Review of Systems  All other systems reviewed and are negative.    Physical Exam Updated Vital Signs BP (!) 168/89   Pulse 79   SpO2 99%   Physical Exam  Nursing note and vitals reviewed.  56 year old male, who appears dyspneic at rest, but is in no acute distress. Vital signs are significant for hypertension. Oxygen saturation is 99%, which is normal. Head is normocephalic and atraumatic. PERRLA, EOMI. Oropharynx is clear. Neck is nontender and supple without adenopathy. JVD is present on the left side, but not the right-probably secondary to presence of AV fistula left upper arm. Back is nontender and there is no CVA tenderness. Lungs have bibasilar rales with faint wheezes on the right.  There are no rhonchi. Chest is nontender. Heart has regular rate and rhythm without murmur. Abdomen is soft, mildly distended, and nontender without masses or hepatosplenomegaly and peristalsis is normoactive. Extremities have no cyanosis or edema, full range of motion is present.  There is 1+ presacral edema. Skin is warm and dry without rash. Neurologic: Mental status is normal, cranial nerves are intact, there are no motor or sensory deficits.  ED Treatments / Results    Radiology Dg Chest Port 1 View  Result Date: 05/02/2017 CLINICAL DATA:  56 y/o M; shortness of breath with cough and congestion. EXAM: PORTABLE CHEST 1 VIEW COMPARISON:  04/24/2017 chest radiograph. FINDINGS: Stable severe cardiomegaly. Pulmonary vascular congestion. No focal consolidation. No pleural effusion or pneumothorax. Aortic atherosclerosis with calcification. Bones are unremarkable. IMPRESSION: Stable severe cardiomegaly.  Pulmonary vascular congestion. Electronically Signed   By: Kristine Garbe M.D.   On: 05/02/2017 05:58    Procedures Procedures (including critical care time)  Medications Ordered in ED Medications - No data to display   Initial Impression / Assessment and Plan / ED Course  I have reviewed the triage vital signs and the nursing notes.  Pertinent imaging results that were available during my care of the patient were reviewed by me and considered in my medical decision making (see chart for details).  End-stage renal disease patient with dyspnea which probably is related to fluid overload.  No evidence of hypoxia.  Will discuss with nephrology whether he should go for emergent dialysis.  Old records are reviewed, and he does have prior hospital admissions for need for emergent dialysis.  Chest x-ray shows cardiomegaly and some vascular congestion, but no overt pulmonary edema.  I discussed case with Dr. Lorrene Reid of nephrology service who agrees that there is no indication for emergent dialysis.  She has contacted his dialysis center and he can be taken there immediately.  Patient is advised of this and he is discharged to go directly to his dialysis center.  Final Clinical Impressions(s) / ED Diagnoses   Final diagnoses:  Shortness of breath  End-stage renal disease on hemodialysis [facility])    New Prescriptions New Prescriptions   No medications on file     Delora Fuel, MD 31/51/76 [phone]

## 2017-05-02 NOTE — ED Triage Notes (Signed)
Pt bib [facility] from home d/t needing dialysis.  Pt has dialysis MWF.  Per EMS pt stated he did not feel he could get to dialysis himself so called them out.

## 2017-05-02 NOTE — Discharge Instructions (Signed)
Go directly to your dialysis center

## 2017-05-03 DIAGNOSIS — B182 Chronic viral hepatitis C: Secondary | ICD-10-CM | POA: Diagnosis not present

## 2017-05-03 DIAGNOSIS — G8929 Other chronic pain: Secondary | ICD-10-CM | POA: Diagnosis not present

## 2017-05-03 DIAGNOSIS — G4700 Insomnia, unspecified: Secondary | ICD-10-CM | POA: Diagnosis not present

## 2017-05-03 DIAGNOSIS — Z720 Tobacco use: Secondary | ICD-10-CM | POA: Diagnosis not present

## 2017-05-03 DIAGNOSIS — F419 Anxiety disorder, unspecified: Secondary | ICD-10-CM | POA: Diagnosis not present

## 2017-05-03 DIAGNOSIS — I10 Essential (primary) hypertension: Secondary | ICD-10-CM | POA: Diagnosis not present

## 2017-05-03 DIAGNOSIS — B1910 Unspecified viral hepatitis B without hepatic coma: Secondary | ICD-10-CM | POA: Diagnosis not present

## 2017-05-03 DIAGNOSIS — N186 End stage renal disease: Secondary | ICD-10-CM | POA: Diagnosis not present

## 2017-05-03 DIAGNOSIS — J45909 Unspecified asthma, uncomplicated: Secondary | ICD-10-CM | POA: Diagnosis not present

## 2017-05-03 DIAGNOSIS — D638 Anemia in other chronic diseases classified elsewhere: Secondary | ICD-10-CM | POA: Diagnosis not present

## 2017-05-03 DIAGNOSIS — I429 Cardiomyopathy, unspecified: Secondary | ICD-10-CM | POA: Diagnosis not present

## 2017-05-04 DIAGNOSIS — D631 Anemia in chronic kidney disease: Secondary | ICD-10-CM | POA: Diagnosis not present

## 2017-05-04 DIAGNOSIS — N186 End stage renal disease: Secondary | ICD-10-CM | POA: Diagnosis not present

## 2017-05-04 DIAGNOSIS — I4891 Unspecified atrial fibrillation: Secondary | ICD-10-CM | POA: Diagnosis not present

## 2017-05-04 DIAGNOSIS — N2581 Secondary hyperparathyroidism of renal origin: Secondary | ICD-10-CM | POA: Diagnosis not present

## 2017-05-04 DIAGNOSIS — D509 Iron deficiency anemia, unspecified: Secondary | ICD-10-CM | POA: Diagnosis not present

## 2017-05-05 DIAGNOSIS — Z992 Dependence on renal dialysis: Secondary | ICD-10-CM | POA: Diagnosis not present

## 2017-05-05 DIAGNOSIS — N186 End stage renal disease: Secondary | ICD-10-CM | POA: Diagnosis not present

## 2017-05-05 DIAGNOSIS — T82858A Stenosis of vascular prosthetic devices, implants and grafts, initial encounter: Secondary | ICD-10-CM | POA: Diagnosis not present

## 2017-05-05 DIAGNOSIS — I871 Compression of vein: Secondary | ICD-10-CM | POA: Diagnosis not present

## 2017-05-06 DIAGNOSIS — N186 End stage renal disease: Secondary | ICD-10-CM | POA: Diagnosis not present

## 2017-05-06 DIAGNOSIS — D509 Iron deficiency anemia, unspecified: Secondary | ICD-10-CM | POA: Diagnosis not present

## 2017-05-06 DIAGNOSIS — D631 Anemia in chronic kidney disease: Secondary | ICD-10-CM | POA: Diagnosis not present

## 2017-05-06 DIAGNOSIS — N2581 Secondary hyperparathyroidism of renal origin: Secondary | ICD-10-CM | POA: Diagnosis not present

## 2017-05-09 DIAGNOSIS — D509 Iron deficiency anemia, unspecified: Secondary | ICD-10-CM | POA: Diagnosis not present

## 2017-05-09 DIAGNOSIS — N2581 Secondary hyperparathyroidism of renal origin: Secondary | ICD-10-CM | POA: Diagnosis not present

## 2017-05-09 DIAGNOSIS — D631 Anemia in chronic kidney disease: Secondary | ICD-10-CM | POA: Diagnosis not present

## 2017-05-09 DIAGNOSIS — N186 End stage renal disease: Secondary | ICD-10-CM | POA: Diagnosis not present

## 2017-05-11 DIAGNOSIS — Z992 Dependence on renal dialysis: Secondary | ICD-10-CM | POA: Diagnosis not present

## 2017-05-11 DIAGNOSIS — D509 Iron deficiency anemia, unspecified: Secondary | ICD-10-CM | POA: Diagnosis not present

## 2017-05-11 DIAGNOSIS — N186 End stage renal disease: Secondary | ICD-10-CM | POA: Diagnosis not present

## 2017-05-11 DIAGNOSIS — D631 Anemia in chronic kidney disease: Secondary | ICD-10-CM | POA: Diagnosis not present

## 2017-05-11 DIAGNOSIS — N2581 Secondary hyperparathyroidism of renal origin: Secondary | ICD-10-CM | POA: Diagnosis not present

## 2017-05-11 DIAGNOSIS — I120 Hypertensive chronic kidney disease with stage 5 chronic kidney disease or end stage renal disease: Secondary | ICD-10-CM | POA: Diagnosis not present

## 2017-05-13 DIAGNOSIS — D631 Anemia in chronic kidney disease: Secondary | ICD-10-CM | POA: Diagnosis not present

## 2017-05-13 DIAGNOSIS — N2581 Secondary hyperparathyroidism of renal origin: Secondary | ICD-10-CM | POA: Diagnosis not present

## 2017-05-13 DIAGNOSIS — D509 Iron deficiency anemia, unspecified: Secondary | ICD-10-CM | POA: Diagnosis not present

## 2017-05-13 DIAGNOSIS — N186 End stage renal disease: Secondary | ICD-10-CM | POA: Diagnosis not present

## 2017-05-16 DIAGNOSIS — D509 Iron deficiency anemia, unspecified: Secondary | ICD-10-CM | POA: Diagnosis not present

## 2017-05-16 DIAGNOSIS — D631 Anemia in chronic kidney disease: Secondary | ICD-10-CM | POA: Diagnosis not present

## 2017-05-16 DIAGNOSIS — N186 End stage renal disease: Secondary | ICD-10-CM | POA: Diagnosis not present

## 2017-05-16 DIAGNOSIS — N2581 Secondary hyperparathyroidism of renal origin: Secondary | ICD-10-CM | POA: Diagnosis not present

## 2017-05-18 DIAGNOSIS — D631 Anemia in chronic kidney disease: Secondary | ICD-10-CM | POA: Diagnosis not present

## 2017-05-18 DIAGNOSIS — N186 End stage renal disease: Secondary | ICD-10-CM | POA: Diagnosis not present

## 2017-05-18 DIAGNOSIS — D509 Iron deficiency anemia, unspecified: Secondary | ICD-10-CM | POA: Diagnosis not present

## 2017-05-18 DIAGNOSIS — N2581 Secondary hyperparathyroidism of renal origin: Secondary | ICD-10-CM | POA: Diagnosis not present

## 2017-05-20 ENCOUNTER — Telehealth: Payer: Self-pay | Admitting: *Deleted

## 2017-05-20 ENCOUNTER — Other Ambulatory Visit: Payer: Self-pay | Admitting: *Deleted

## 2017-05-20 DIAGNOSIS — N186 End stage renal disease: Secondary | ICD-10-CM | POA: Diagnosis not present

## 2017-05-20 DIAGNOSIS — D509 Iron deficiency anemia, unspecified: Secondary | ICD-10-CM | POA: Diagnosis not present

## 2017-05-20 DIAGNOSIS — D631 Anemia in chronic kidney disease: Secondary | ICD-10-CM | POA: Diagnosis not present

## 2017-05-20 DIAGNOSIS — N2581 Secondary hyperparathyroidism of renal origin: Secondary | ICD-10-CM | POA: Diagnosis not present

## 2017-05-20 NOTE — Telephone Encounter (Signed)
After leaving numerous messages for Mr. Hershman to call me back, I called Adam, RN at [facility] to schedule a Left arm fistulogram on 05-26-17 as an outpatient. The reason for this case was d/t faxed request from [facility] received on 04-29-17 for prolonged bleeding after patient's hemodialysis session. Quita Skye will relay the prep information and time of arrival to this patient on Monday 05-23-17. If the patient has any concerns or questions, he will be instructed to call me.

## 2017-05-22 ENCOUNTER — Non-Acute Institutional Stay (HOSPITAL_COMMUNITY)
Admission: EM | Admit: 2017-05-22 | Discharge: 2017-05-22 | Disposition: A | Discharge status: Home / Self Care | Payer: Medicare Other | Attending: Emergency Medicine | Admitting: Emergency Medicine

## 2017-05-22 ENCOUNTER — Encounter (HOSPITAL_COMMUNITY): Payer: Self-pay

## 2017-05-22 ENCOUNTER — Emergency Department (HOSPITAL_COMMUNITY): Payer: Medicare Other

## 2017-05-22 DIAGNOSIS — Z7982 Long term (current) use of aspirin: Secondary | ICD-10-CM | POA: Diagnosis not present

## 2017-05-22 DIAGNOSIS — Z79899 Other long term (current) drug therapy: Secondary | ICD-10-CM | POA: Diagnosis not present

## 2017-05-22 DIAGNOSIS — J441 Chronic obstructive pulmonary disease with (acute) exacerbation: Secondary | ICD-10-CM | POA: Insufficient documentation

## 2017-05-22 DIAGNOSIS — I120 Hypertensive chronic kidney disease with stage 5 chronic kidney disease or end stage renal disease: Secondary | ICD-10-CM | POA: Insufficient documentation

## 2017-05-22 DIAGNOSIS — F17210 Nicotine dependence, cigarettes, uncomplicated: Secondary | ICD-10-CM | POA: Diagnosis not present

## 2017-05-22 DIAGNOSIS — E875 Hyperkalemia: Secondary | ICD-10-CM | POA: Diagnosis present

## 2017-05-22 DIAGNOSIS — Z7902 Long term (current) use of antithrombotics/antiplatelets: Secondary | ICD-10-CM | POA: Insufficient documentation

## 2017-05-22 DIAGNOSIS — Z992 Dependence on renal dialysis: Secondary | ICD-10-CM | POA: Insufficient documentation

## 2017-05-22 DIAGNOSIS — K7460 Unspecified cirrhosis of liver: Secondary | ICD-10-CM | POA: Diagnosis not present

## 2017-05-22 DIAGNOSIS — R0682 Tachypnea, not elsewhere classified: Secondary | ICD-10-CM | POA: Diagnosis not present

## 2017-05-22 DIAGNOSIS — Z79891 Long term (current) use of opiate analgesic: Secondary | ICD-10-CM | POA: Insufficient documentation

## 2017-05-22 DIAGNOSIS — R0602 Shortness of breath: Secondary | ICD-10-CM | POA: Diagnosis not present

## 2017-05-22 DIAGNOSIS — N186 End stage renal disease: Secondary | ICD-10-CM | POA: Diagnosis not present

## 2017-05-22 LAB — COMPREHENSIVE METABOLIC PANEL
ALT: 21 U/L (ref 17–63)
AST: 45 U/L — ABNORMAL HIGH (ref 15–41)
Albumin: 4.1 g/dL (ref 3.5–5.0)
Alkaline Phosphatase: 108 U/L (ref 38–126)
Anion gap: 13 (ref 5–15)
BUN: 23 mg/dL — ABNORMAL HIGH (ref 6–20)
CO2: 28 mmol/L (ref 22–32)
Calcium: 9.7 mg/dL (ref 8.9–10.3)
Chloride: 85 mmol/L — ABNORMAL LOW (ref 101–111)
Creatinine, Ser: 6.45 mg/dL — ABNORMAL HIGH (ref 0.61–1.24)
GFR calc Af Amer: 10 mL/min — ABNORMAL LOW (ref >60)
GFR calc non Af Amer: 9 mL/min — ABNORMAL LOW (ref >60)
Glucose, Bld: 93 mg/dL (ref 65–99)
Potassium: 6.9 mmol/L (ref 3.5–5.1)
Sodium: 126 mmol/L — ABNORMAL LOW (ref 135–145)
Total Bilirubin: 1.3 mg/dL — ABNORMAL HIGH (ref 0.3–1.2)
Total Protein: 7.5 g/dL (ref 6.5–8.1)

## 2017-05-22 LAB — CBC WITH DIFFERENTIAL/PLATELET
Basophils Absolute: 0.1 10*3/uL (ref 0.0–0.1)
Basophils Relative: 1 %
Eosinophils Absolute: 0.1 10*3/uL (ref 0.0–0.7)
Eosinophils Relative: 2 %
HCT: 30.3 % — ABNORMAL LOW (ref 39.0–52.0)
Hemoglobin: 10.5 g/dL — ABNORMAL LOW (ref 13.0–17.0)
Lymphocytes Relative: 14 %
Lymphs Abs: 1.2 10*3/uL (ref 0.7–4.0)
MCH: 28.5 pg (ref 26.0–34.0)
MCHC: 34.7 g/dL (ref 30.0–36.0)
MCV: 82.1 fL (ref 78.0–100.0)
Monocytes Absolute: 1 10*3/uL (ref 0.1–1.0)
Monocytes Relative: 11 %
Neutro Abs: 6.4 10*3/uL (ref 1.7–7.7)
Neutrophils Relative %: 73 %
Platelets: 120 10*3/uL — ABNORMAL LOW (ref 150–400)
RBC: 3.69 MIL/uL — ABNORMAL LOW (ref 4.22–5.81)
RDW: 18.1 % — ABNORMAL HIGH (ref 11.5–15.5)
WBC: 8.8 10*3/uL (ref 4.0–10.5)

## 2017-05-22 LAB — I-STAT TROPONIN, ED: Troponin i, poc: 0.06 ng/mL (ref 0.00–0.08)

## 2017-05-22 LAB — POTASSIUM: Potassium: 6.7 mmol/L (ref 3.5–5.1)

## 2017-05-22 LAB — I-STAT CG4 LACTIC ACID, ED: Lactic Acid, Venous: 1.3 mmol/L (ref 0.5–1.9)

## 2017-05-22 LAB — LIPASE, BLOOD: Lipase: 31 U/L (ref 11–51)

## 2017-05-22 MED ORDER — MORPHINE SULFATE (PF) 4 MG/ML IV SOLN
4.0000 mg | Freq: Once | INTRAVENOUS | Status: AC
  Administered 2017-05-22: 4 mg via INTRAVENOUS
  Filled 2017-05-22: qty 1

## 2017-05-22 MED ORDER — PREDNISONE 20 MG PO TABS: 40.0000 mg | ORAL_TABLET | Freq: Every day | ORAL | 0 refills | Status: DC

## 2017-05-22 MED ORDER — OXYCODONE HCL 5 MG PO TABS
10.0000 mg | ORAL_TABLET | Freq: Once | ORAL | Status: AC
  Administered 2017-05-22: 10 mg via ORAL

## 2017-05-22 MED ORDER — METOPROLOL TARTRATE 5 MG/5ML IV SOLN
5.0000 mg | Freq: Once | INTRAVENOUS | Status: AC
  Administered 2017-05-22: 5 mg via INTRAVENOUS

## 2017-05-22 MED ORDER — METOPROLOL TARTRATE 5 MG/5ML IV SOLN
INTRAVENOUS | Status: AC
  Administered 2017-05-22: 5 mg via INTRAVENOUS
  Filled 2017-05-22: qty 5

## 2017-05-22 MED ORDER — OXYCODONE HCL 5 MG PO TABS
ORAL_TABLET | ORAL | Status: AC
  Administered 2017-05-22: 10 mg via ORAL
  Filled 2017-05-22: qty 2

## 2017-05-22 MED ORDER — LABETALOL HCL 5 MG/ML IV SOLN: 10.0000 mg | Freq: Once | INTRAVENOUS | Status: DC

## 2017-05-22 MED ORDER — IPRATROPIUM BROMIDE 0.02 % IN SOLN
0.5000 mg | Freq: Once | RESPIRATORY_TRACT | Status: AC
  Administered 2017-05-22: 0.5 mg via RESPIRATORY_TRACT
  Filled 2017-05-22: qty 2.5

## 2017-05-22 MED ORDER — PREDNISONE 20 MG PO TABS
40.0000 mg | ORAL_TABLET | Freq: Once | ORAL | Status: AC
Start: 2017-05-22 — End: 2017-05-22
  Administered 2017-05-22: 40 mg via ORAL
  Filled 2017-05-22: qty 2

## 2017-05-22 MED ORDER — ONDANSETRON HCL 4 MG/2ML IJ SOLN: 4.0000 mg | Freq: Once | INTRAMUSCULAR | Status: DC

## 2017-05-22 MED ORDER — ALBUTEROL SULFATE (2.5 MG/3ML) 0.083% IN NEBU: 5.0000 mg | INHALATION_SOLUTION | Freq: Once | RESPIRATORY_TRACT | Status: DC

## 2017-05-22 MED ORDER — METOCLOPRAMIDE HCL 5 MG/ML IJ SOLN
10.0000 mg | Freq: Once | INTRAMUSCULAR | Status: AC
  Administered 2017-05-22: 10 mg via INTRAVENOUS
  Filled 2017-05-22: qty 2

## 2017-05-22 MED ORDER — ALBUTEROL SULFATE (2.5 MG/3ML) 0.083% IN NEBU
5.0000 mg | INHALATION_SOLUTION | Freq: Once | RESPIRATORY_TRACT | Status: AC
  Administered 2017-05-22: 5 mg via RESPIRATORY_TRACT
  Filled 2017-05-22: qty 6

## 2017-05-22 NOTE — ED Notes (Addendum)
Per Dr. Maryan Rued, pt consented to be dialyzed today but states "I want it done soon, I'm not gonna wait around here until midnight tonight". This RN awaiting call from dialysis.

## 2017-05-22 NOTE — Progress Notes (Signed)
Patient HR sustaining high 130's.  Nephrology notified and order received to d/c UF.  Will continue to monitor.

## 2017-05-22 NOTE — Progress Notes (Signed)
Tx terminated per patient with 12 minutes remaining d/t concern with ride situation.  MD notified and AMA form signed. 2141 mL ultrafiltrated and net fluid removal 1641 mL.    Patient status unchanged. Lung sounds diminished to ausculation in all fields. No edema. Cardiac: ST.  Disconnected lines and removed needles.  Pressure held for 10 minutes and band aid/gauze dressing applied.  IV pulled and patient discharged home per self.

## 2017-05-22 NOTE — ED Notes (Signed)
Pt able to eat graham crackers, drink coffee, and eat [place] sandwich with no N/V.

## 2017-05-22 NOTE — ED Notes (Signed)
Per EDP, AVS to be printed as is as pt has received his prescriptions. AVS tubed to station 84 per Gerrit Friends, [place] for d/c after dialysis.

## 2017-05-22 NOTE — ED Notes (Signed)
Per main lab, CMP and lipase hemolyzed. Will redraw STAT

## 2017-05-22 NOTE — ED Notes (Signed)
This RN attempted to call hemodialysis for estimate on pt transport. Unable to reach, will try again shortly. Pt visibly restless. Delay explained to pt.

## 2017-05-22 NOTE — ED Notes (Signed)
Pt to be d/c from dialysis. Pt prescription transported upstairs on the stretcher with patient, Gerrit Friends, RN aware AVS papers to be printed in dialysis after treatment.

## 2017-05-22 NOTE — ED Triage Notes (Signed)
Pt from home via EMS for abd distention and SOB x 2 days. Pt M/W/F dialysis pt with last full dialysis treatment on Fri. Pt also reports decreased appetite, diarrhea, and abd pain. Per EMS, no peripheral edema noted. Pt used inhaler without relief. EMS VS: 193/93, 28 RR, 98% on RA

## 2017-05-22 NOTE — Procedures (Signed)
I was present at this dialysis session. I have reviewed the session itself and made appropriate changes.  He developed tachycardia and was given 5mg  IV metoprolol with marked improvement of BP and HR.  His HD was shortened due to an emergency and will go to his HD unit tomorrow.  Filed Weights   05/22/17 1221 05/22/17 1810  Weight: 74.8 kg (165 lb) 77.8 kg (171 lb 8.3 oz)    Recent Labs  Lab 05/22/17 1236 05/22/17 1437  NA 126*  --   K 6.9* 6.7*  CL 85*  --   CO2 28  --   GLUCOSE 93  --   BUN 23*  --   CREATININE 6.45*  --   CALCIUM 9.7  --     Recent Labs  Lab 05/22/17 1236  WBC 8.8  NEUTROABS 6.4  HGB 10.5*  HCT 30.3*  MCV 82.1  PLT 120*    Scheduled Meds: Continuous Infusions: PRN Meds:.   Donetta Potts,  MD 05/22/2017, 8:38 PM

## 2017-05-22 NOTE — ED Notes (Signed)
EDP aware of pt potassium

## 2017-05-22 NOTE — ED Provider Notes (Signed)
[name]   CSN: [id number] Arrival date & time: 05/22/17  1219     History   Chief Complaint Chief Complaint  Patient presents with  . Bloated  . Shortness of Breath    HPI [name] is a 56 y.o. male.  [name] with multiple medical problems including end-stage renal disease on dialysis Monday Wednesday Friday, COPD, hepatitis B and C, hypertension, prior alcohol and cocaine abuse, cirrhosis with ascites who is presenting today with abdominal distention and shortness of breath.  Patient states he completed a full course of dialysis on Friday and shortly after leaving ate something which he thought might of had too much salt and developed significant distention and pain throughout his abdomen.  Since Friday symptoms have worsened.  He is now also been short of breath which has worsened since Friday.  He is having some wheezing and cough but denies productive cough or fever.  He denies any lower extremity swelling or chest pain.  Patient denies ever having drainage of ascites in the past.  Last bowel movement was yesterday.   The history is provided by the patient.    Past Medical History:  Diagnosis Date  . Adenomatous colon polyp    tubular  . Anemia   . Anxiety   . Arthritis    left shoulder  . Atherosclerosis of aorta ([place])   . Cardiomegaly   . Chest pain    DATE UNKNOWN, C/O PERIODICALLY  . Chronic kidney disease    dialysis - M/W/F  . Cocaine abuse ([place])   . COPD exacerbation ([place]) 08/17/2016  . Dialysis patient ([place])    Monday-Wednesday-Friday  . GERD (gastroesophageal reflux disease)    DATE UNKNOWN  . Hemorrhoids   . Hepatitis B, chronic ([place])   . Hepatitis C   . Hyperkalemia   . Hypertension   . Metabolic bone disease    Patient denies  . Nephrolithiasis   . Pneumonia   . Pulmonary edema   . Renal disorder   . Renal insufficiency   . Shortness of breath dyspnea    "  for the last past year with this dialysis"  . Tubular adenoma of colon     Patient Active Problem List   Diagnosis Date Noted  . Acute metabolic encephalopathy   . Anemia 04/23/2017  . Ascites 04/23/2017  . COPD (chronic obstructive pulmonary disease) ([place]) 04/23/2017  . Acute on chronic respiratory failure with hypoxia ([place]) 03/25/2017  . Atrial flutter ([place]) 03/25/2017  . Arrhythmia 03/25/2017  . COPD GOLD II/ still smoking 09/27/2016  . Essential hypertension 09/27/2016  . Fluid overload 08/30/2016  . COPD exacerbation ([place]) 08/17/2016  . Hypertensive urgency 08/17/2016  . Respiratory failure ([place]) 08/17/2016  . Problem with dialysis access ([place]) 07/23/2016  . End stage renal disease ([place]) 12/02/2015  . Chronic hepatitis B ([place]) 03/05/2014  . Chronic hepatitis C without hepatic coma ([place]) 03/05/2014  . Internal hemorrhoids with bleeding, swelling and itching 03/05/2014  . Thrombocytopenia ([place]) 03/05/2014  . Chest pain 02/27/2014  . Alcohol abuse 04/14/2009  . Cigarette smoker 04/14/2009  . GANGLION CYST 04/14/2009    Past Surgical History:  Procedure Laterality Date  . AV FISTULA PLACEMENT  2012   BELIEVED WAS PLACED IN JUNE  . COLONOSCOPY    . HEMORRHOID BANDING    . POLYPECTOMY         Home Medications    Prior to Admission medications  Medication Sig Start Date End Date Taking? Authorizing Provider  acetaminophen (TYLENOL) 500 MG tablet Take 500-1,000 mg by mouth every 6 (six) hours as needed (for headaches and pain).    [provider]  albuterol (PROVENTIL HFA;VENTOLIN HFA) 108 (90 Base) MCG/ACT inhaler Inhale 2 puffs into the lungs every 6 (six) hours as needed for wheezing or shortness of breath. 03/10/17   Javier Glazier, MD  aspirin EC 81 MG tablet Take 81 mg by mouth daily.    [provider]  budesonide-formoterol (SYMBICORT) 160-4.5 MCG/ACT inhaler Inhale 2 puffs into the lungs 2 (two) times daily.    [provider]    calcitRIOL (ROCALTROL) 0.25 MCG capsule Take 3 capsules (0.75 mcg total) by mouth every Monday, Wednesday, and Friday with hemodialysis. 03/28/17   Cherene Altes, MD  clopidogrel (PLAVIX) 75 MG tablet Take 1 tablet (75 mg total) by mouth daily. 02/24/17   Velvet Bathe, MD  gabapentin (NEURONTIN) 100 MG capsule Take 100 mg by mouth up to two times a day 01/06/17   [provider]  metoprolol succinate (TOPROL-XL) 25 MG 24 hr tablet Take 25 mg by mouth daily. 02/23/17   [provider]  oxyCODONE (OXY IR/ROXICODONE) 5 MG immediate release tablet Take 5 mg by mouth 2 (two) times daily as needed for pain. 02/03/17   [provider]  polyethylene glycol powder (GLYCOLAX/MIRALAX) powder Take 17grams (1 capful) dissolved in at least 8 ounces of water or juice, once daily Patient taking differently: Take 0.5 Containers by mouth daily as needed for mild constipation.  01/13/17   Pyrtle, Lajuan Lines, MD  sevelamer carbonate (RENVELA) 800 MG tablet Take 3 tablets (2,400 mg total) by mouth 3 (three) times daily with meals. 03/26/17   Cherene Altes, MD  Spacer/Aero-Holding Chambers (AEROCHAMBER MV) inhaler Use as instructed 03/10/17   Javier Glazier, MD    Family History Family History  Problem Relation Age of Onset  . Heart disease Mother   . Lung cancer Mother   . Heart disease Father   . Malignant hyperthermia Father   . COPD Father   . Throat cancer Sister   . Esophageal cancer Sister   . Hypertension Other   . COPD Other   . Colon cancer Neg Hx   . Colon polyps Neg Hx   . Rectal cancer Neg Hx   . Stomach cancer Neg Hx     Social History Social History   Tobacco Use  . Smoking status: Current Every Day Smoker    Packs/day: 0.50    Years: 43.00    Pack years: 21.50    Types: Cigarettes    Start date: 08/13/1973  . Smokeless tobacco: Never Used  . Tobacco comment: Less than 1/2 pk per day  Substance Use Topics  . Alcohol use: Yes    Alcohol/week: 2.4 oz     Types: 1 Glasses of wine, 1 Cans of beer, 1 Shots of liquor, 1 Standard drinks or equivalent per week    Comment: occasional   . Drug use: Yes    Types: Marijuana, Cocaine     Allergies   Aspirin; Clonidine derivatives; and Tramadol   Review of Systems Review of Systems  All other systems reviewed and are negative.    Physical Exam Updated Vital Signs BP (!) 184/80 (BP Location: Right Arm)   Pulse 84   Temp (!) 97.4 F (36.3 C) (Oral)   Resp (!) 25   Ht 5\' 9"  (1.753 m)  Wt 74.8 kg (165 lb)   SpO2 96%   BMI 24.37 kg/m   Physical Exam  Constitutional: He is oriented to person, place, and time. He appears well-developed and well-nourished. No distress.  HENT:  Head: Normocephalic and atraumatic.  Mouth/Throat: Oropharynx is clear and moist.  Eyes: Conjunctivae and EOM are normal. Pupils are equal, round, and reactive to light.  Neck: Normal range of motion. Neck supple.  Cardiovascular: Normal rate, regular rhythm and intact distal pulses.  No murmur heard. Pulmonary/Chest: Effort normal. Tachypnea noted. No respiratory distress. He has wheezes. He has no rales.  Abdominal: Soft. Bowel sounds are normal. He exhibits distension and ascites. There is tenderness. There is no rebound and no guarding.  Musculoskeletal: Normal range of motion. He exhibits no edema or tenderness.  AV fistula present in the left upper extremity with palpable pulse  Neurological: He is alert and oriented to person, place, and time.  Skin: Skin is warm and dry. No rash noted. No erythema.  Psychiatric: He has a normal mood and affect. His behavior is normal.  Nursing note and vitals reviewed.    ED Treatments / Results  Labs (all labs ordered are listed, but only abnormal results are displayed) Labs Reviewed  CBC WITH DIFFERENTIAL/PLATELET - Abnormal; Notable for the following components:      Result Value   RBC 3.69 (*)    Hemoglobin 10.5 (*)    HCT 30.3 (*)    RDW 18.1 (*)     Platelets 120 (*)    All other components within normal limits  COMPREHENSIVE METABOLIC PANEL - Abnormal; Notable for the following components:   Sodium 126 (*)    Potassium 6.9 (*)    Chloride 85 (*)    BUN 23 (*)    Creatinine, Ser 6.45 (*)    AST 45 (*)    Total Bilirubin 1.3 (*)    GFR calc non Af Amer 9 (*)    GFR calc Af Amer 10 (*)    All other components within normal limits  POTASSIUM - Abnormal; Notable for the following components:   Potassium 6.7 (*)    All other components within normal limits  LIPASE, BLOOD  I-STAT TROPONIN, ED  I-STAT CG4 LACTIC ACID, ED    EKG  EKG Interpretation  Date/Time:  Sunday May 22 2017 12:40:16 EST Ventricular Rate:  84 PR Interval:    QRS Duration: 187 QT Interval:  467 QTC Calculation: 553 R Axis:   -126 Text Interpretation:  Sinus rhythm Prolonged PR interval Consider left ventricular hypertrophy Prolonged QT interval No significant change since last tracing Confirmed by Blanchie Dessert ([id number]) on 05/22/2017 12:48:23 PM       Radiology Dg Chest 2 View  Result Date: 05/22/2017 CLINICAL DATA:  Shortness of breath and hypertension EXAM: CHEST  2 VIEW COMPARISON:  May 02, 2017 FINDINGS: There is mild elevation of the left hemidiaphragm, stable. There is mild left base atelectasis. Lungs elsewhere clear. There is cardiomegaly with pulmonary venous hypertension. There is aortic atherosclerosis. No evident adenopathy. No bone lesions. IMPRESSION: Left base atelectasis. No edema or consolidation. Stable cardiomegaly. There is aortic atherosclerosis. Aortic Atherosclerosis (ICD10-I70.0). Electronically Signed   By: Lowella Grip III M.D.   On: 05/22/2017 13:04    Procedures Procedures (including critical care time)  Medications Ordered in ED Medications  ondansetron (ZOFRAN) injection 4 mg (not administered)  morphine 4 MG/ML injection 4 mg (not administered)  albuterol (PROVENTIL) (2.5 MG/3ML) 0.083% nebulizer  solution 5  mg (not administered)  ipratropium (ATROVENT) nebulizer solution 0.5 mg (not administered)     Initial Impression / Assessment and Plan / ED Course  I have reviewed the triage vital signs and the nursing notes.  Pertinent labs & imaging results that were available during my care of the patient were reviewed by me and considered in my medical decision making (see chart for details).     Patient presenting with a history of multiple medical problems with abdominal bloating and shortness of breath.  On exam patient oxygen saturation is 96% on room air.  However he is tachypneic with wheezing and some rhonchi.  Also patient has evidence of significant abdominal distention and concern for ascites in the setting of cirrhosis.  Low suspicion for SBP as patient has never had any invasive procedures or removal of ascitic fluid.  Patient does have a history of noncompliance but had a full 4-hour course of dialysis on Friday.  He does have some mild JVD but otherwise no distal edema.  She denies any chest pain but does have a significant cardiac history. Concerned that patient's ascites might be causing his shortness of breath versus COPD exacerbation versus fluid overload.  Lower suspicion for bowel obstruction as patient is having normal bowel movements.  Lower suspicion for infection as patient denies any fever or productive cough. Patient given albuterol and Atrovent.  He was also given morphine and Zofran.  1:24 PM Chest x-rays shows no evidence of pulmonary edema or fluid overload.  Bedside ultrasound shows no large pocket of fluid drainable by paracentesis.  2:21 PM Patient's breath sounds are improved after albuterol and Atrovent.  Lipase is within normal limits however CMP returned with a potassium of 6.9.  Will repeat to ensure no evidence of hemolysis.  However if this remains elevated patient will need emergent dialysis for hyperkalemia.  3:13 PM P potassium is still elevated at  6.7.  Will discuss with nephrology.  Patient's shortness of breath is improved after albuterol and Atrovent. Pt given prednisone for COPD exacerbation.  Due to hyperkalemia patient will have dialysis but then feel it is reasonable for patient to be discharged home.  He was given a prescription for prednisone to take while at home and he has plenty of an inhaler at home.  Final Clinical Impressions(s) / ED Diagnoses   Final diagnoses:  Acute hyperkalemia  ESRD (end stage renal disease) ([place])  COPD with acute exacerbation [facility])    ED Discharge Orders    None       Blanchie Dessert, MD 05/22/17 1542

## 2017-05-22 NOTE — ED Notes (Signed)
EDP at bedside with US.  

## 2017-05-22 NOTE — ED Notes (Signed)
ED Provider at bedside. 

## 2017-05-22 NOTE — ED Notes (Addendum)
This RN updated pt that dialysis should be calling for pt in approx 25-30 minutes based on estimate. Pt states "well if they don't call soon then I'm leaving baby. I just wanted to know what was wrong with me." At this time, pt is completely aware and verbalized understanding of the risks of leaving even after explanation from both this RN and EDP. Pt given education on prednisone prescription, RN to transport rx to dialysis when called to transport pt.

## 2017-05-22 NOTE — ED Notes (Signed)
Per dialysis, pt to be transported in approx 30-45 minutes.

## 2017-05-22 NOTE — ED Notes (Signed)
RT at bedside.

## 2017-05-22 NOTE — ED Notes (Signed)
Small amount of oozing noted from pt fistula. Covered with gauze by this RN. When updating pt to delay and plan of care, pt states "No no no, I want to go home. I want to go home." This RN explained to pt risks of leaving without nephrology consult. Pt visibly irritated and states "I came to find out what's wrong with me, and I want to go home." This RN attempted therapeutic communication. Pt removed BP cuff and reports he will not keep it on.

## 2017-05-24 DIAGNOSIS — Z1331 Encounter for screening for depression: Secondary | ICD-10-CM | POA: Diagnosis not present

## 2017-05-24 DIAGNOSIS — M79602 Pain in left arm: Secondary | ICD-10-CM | POA: Diagnosis not present

## 2017-05-24 DIAGNOSIS — G8929 Other chronic pain: Secondary | ICD-10-CM | POA: Diagnosis not present

## 2017-05-25 DIAGNOSIS — D631 Anemia in chronic kidney disease: Secondary | ICD-10-CM | POA: Diagnosis not present

## 2017-05-25 DIAGNOSIS — N186 End stage renal disease: Secondary | ICD-10-CM | POA: Diagnosis not present

## 2017-05-25 DIAGNOSIS — N2581 Secondary hyperparathyroidism of renal origin: Secondary | ICD-10-CM | POA: Diagnosis not present

## 2017-05-25 DIAGNOSIS — D509 Iron deficiency anemia, unspecified: Secondary | ICD-10-CM | POA: Diagnosis not present

## 2017-05-26 ENCOUNTER — Encounter (HOSPITAL_COMMUNITY)
Admission: RE | Disposition: A | Discharge status: Home / Self Care | Payer: Self-pay | Source: Ambulatory Visit | Attending: Vascular Surgery

## 2017-05-26 ENCOUNTER — Encounter (HOSPITAL_COMMUNITY): Payer: Self-pay | Admitting: Vascular Surgery

## 2017-05-26 ENCOUNTER — Ambulatory Visit: Payer: Medicare Other | Admitting: Vascular Surgery

## 2017-05-26 ENCOUNTER — Ambulatory Visit (HOSPITAL_COMMUNITY)
Admission: RE | Admit: 2017-05-26 | Discharge: 2017-05-26 | Disposition: A | Discharge status: Home / Self Care | Payer: Medicare Other | Source: Ambulatory Visit | Attending: Vascular Surgery | Admitting: Vascular Surgery

## 2017-05-26 DIAGNOSIS — J449 Chronic obstructive pulmonary disease, unspecified: Secondary | ICD-10-CM

## 2017-05-26 DIAGNOSIS — F419 Anxiety disorder, unspecified: Secondary | ICD-10-CM | POA: Insufficient documentation

## 2017-05-26 DIAGNOSIS — Y832 Surgical operation with anastomosis, bypass or graft as the cause of abnormal reaction of the patient, or of later complication, without mention of misadventure at the time of the procedure: Secondary | ICD-10-CM

## 2017-05-26 DIAGNOSIS — B1920 Unspecified viral hepatitis C without hepatic coma: Secondary | ICD-10-CM | POA: Insufficient documentation

## 2017-05-26 DIAGNOSIS — T82858A Stenosis of vascular prosthetic devices, implants and grafts, initial encounter: Secondary | ICD-10-CM

## 2017-05-26 DIAGNOSIS — Z992 Dependence on renal dialysis: Secondary | ICD-10-CM | POA: Diagnosis not present

## 2017-05-26 DIAGNOSIS — N186 End stage renal disease: Secondary | ICD-10-CM

## 2017-05-26 DIAGNOSIS — B181 Chronic viral hepatitis B without delta-agent: Secondary | ICD-10-CM

## 2017-05-26 DIAGNOSIS — T82898A Other specified complication of vascular prosthetic devices, implants and grafts, initial encounter: Secondary | ICD-10-CM | POA: Diagnosis not present

## 2017-05-26 DIAGNOSIS — F17210 Nicotine dependence, cigarettes, uncomplicated: Secondary | ICD-10-CM | POA: Insufficient documentation

## 2017-05-26 DIAGNOSIS — I700 Atherosclerosis of aorta: Secondary | ICD-10-CM

## 2017-05-26 DIAGNOSIS — I120 Hypertensive chronic kidney disease with stage 5 chronic kidney disease or end stage renal disease: Secondary | ICD-10-CM

## 2017-05-26 DIAGNOSIS — Z955 Presence of coronary angioplasty implant and graft: Secondary | ICD-10-CM | POA: Insufficient documentation

## 2017-05-26 DIAGNOSIS — K219 Gastro-esophageal reflux disease without esophagitis: Secondary | ICD-10-CM | POA: Insufficient documentation

## 2017-05-26 HISTORY — PX: A/V FISTULAGRAM: CATH118298

## 2017-05-26 LAB — POCT I-STAT, CHEM 8
BUN: 27 mg/dL — ABNORMAL HIGH (ref 6–20)
Calcium, Ion: 1.25 mmol/L (ref 1.15–1.40)
Chloride: 89 mmol/L — ABNORMAL LOW (ref 101–111)
Creatinine, Ser: 5.4 mg/dL — ABNORMAL HIGH (ref 0.61–1.24)
Glucose, Bld: 116 mg/dL — ABNORMAL HIGH (ref 65–99)
HCT: 31 % — ABNORMAL LOW (ref 39.0–52.0)
Hemoglobin: 10.5 g/dL — ABNORMAL LOW (ref 13.0–17.0)
Potassium: 5.3 mmol/L — ABNORMAL HIGH (ref 3.5–5.1)
Sodium: 129 mmol/L — ABNORMAL LOW (ref 135–145)
TCO2: 33 mmol/L — ABNORMAL HIGH (ref 22–32)

## 2017-05-26 SURGERY — A/V FISTULAGRAM
Anesthesia: LOCAL | Laterality: Left

## 2017-05-26 MED ORDER — LIDOCAINE HCL (PF) 1 % IJ SOLN
INTRAMUSCULAR | Status: AC
  Filled 2017-05-26: qty 30

## 2017-05-26 MED ORDER — SODIUM CHLORIDE 0.9% FLUSH: 3.0000 mL | Freq: Two times a day (BID) | INTRAVENOUS | Status: DC

## 2017-05-26 MED ORDER — LIDOCAINE HCL (PF) 1 % IJ SOLN
INTRAMUSCULAR | Status: DC | PRN
  Administered 2017-05-26: 5 mL

## 2017-05-26 MED ORDER — HYDRALAZINE HCL 20 MG/ML IJ SOLN: 5.0000 mg | INTRAMUSCULAR | Status: DC | PRN

## 2017-05-26 MED ORDER — SODIUM CHLORIDE 0.9% FLUSH: 3.0000 mL | INTRAVENOUS | Status: DC | PRN

## 2017-05-26 MED ORDER — HEPARIN (PORCINE) IN NACL 2-0.9 UNIT/ML-% IJ SOLN
INTRAMUSCULAR | Status: AC | PRN
  Administered 2017-05-26: 500 mL via INTRA_ARTERIAL

## 2017-05-26 MED ORDER — SODIUM CHLORIDE 0.9 % IV SOLN: 250.0000 mL | INTRAVENOUS | Status: DC | PRN

## 2017-05-26 MED ORDER — HEPARIN (PORCINE) IN NACL 2-0.9 UNIT/ML-% IJ SOLN
INTRAMUSCULAR | Status: AC
  Filled 2017-05-26: qty 500

## 2017-05-26 MED ORDER — LABETALOL HCL 5 MG/ML IV SOLN: 10.0000 mg | INTRAVENOUS | Status: DC | PRN

## 2017-05-26 MED ORDER — SODIUM CHLORIDE 0.9% FLUSH
3.0000 mL | INTRAVENOUS | Status: DC | PRN
Start: 2017-05-26 — End: 2017-05-26

## 2017-05-26 MED ORDER — IODIXANOL 320 MG/ML IV SOLN
INTRAVENOUS | Status: DC | PRN
  Administered 2017-05-26: 25 mL via INTRA_ARTERIAL

## 2017-05-26 SURGICAL SUPPLY — 8 items
BAG SNAP BAND KOVER 36X36 (MISCELLANEOUS) ×2 IMPLANT
COVER DOME SNAP 22 D (MISCELLANEOUS) ×2 IMPLANT
COVER PRB 48X5XTLSCP FOLD TPE (BAG) ×1 IMPLANT
COVER PROBE 5X48 (BAG) ×2
KIT MICROINTRODUCER STIFF 5F (SHEATH) ×1 IMPLANT
STOPCOCK MORSE 400PSI 3WAY (MISCELLANEOUS) ×2 IMPLANT
TRAY PV CATH (CUSTOM PROCEDURE TRAY) ×2 IMPLANT
TUBING CIL FLEX 10 FLL-RA (TUBING) ×2 IMPLANT

## 2017-05-26 NOTE — Discharge Instructions (Signed)
Fistulogram, Care After °Refer to this sheet in the next few weeks. These instructions provide you with information on caring for yourself after your procedure. Your health care provider may also give you more specific instructions. Your treatment has been planned according to current medical practices, but problems sometimes occur. Call your health care provider if you have any problems or questions after your procedure. °What can I expect after the procedure? °After your procedure, it is typical to have the following: °· A small amount of discomfort in the area where the catheters were placed. °· A small amount of bruising around the fistula. °· Sleepiness and fatigue. ° °Follow these instructions at home: °· Rest at home for the day following your procedure. °· Do not drive or operate heavy machinery while taking pain medicine. °· Take medicines only as directed by your health care provider. °· Do not take baths, swim, or use a hot tub until your health care provider approves. You may shower 24 hours after the procedure or as directed by your health care provider. °· There are many different ways to close and cover an incision, including stitches, skin glue, and adhesive strips. Follow your health care provider's instructions on: °? Incision care. °? Bandage (dressing) changes and removal. °? Incision closure removal. °· Monitor your dialysis fistula carefully. °Contact a health care provider if: °· You have drainage, redness, swelling, or pain at your catheter site. °· You have a fever. °· You have chills. °Get help right away if: °· You feel weak. °· You have trouble balancing. °· You have trouble moving your arms or legs. °· You have problems with your speech or vision. °· You can no longer feel a vibration or buzz when you put your fingers over your dialysis fistula. °· The limb that was used for the procedure: °? Swells. °? Is painful. °? Is cold. °? Is discolored, such as blue or pale white. °This  information is not intended to replace advice given to you by your health care provider. Make sure you discuss any questions you have with your health care provider. °Document Released: 11/12/2013 Document Revised: 12/04/2015 Document Reviewed: 08/17/2013 °Elsevier Interactive Patient Education © 2018 [employer] Inc. ° °

## 2017-05-26 NOTE — H&P (Signed)
Brief History and Physical  History of Present Illness   [name] is a 56 y.o. male who presents with chief complaint: bumps on left arm fistula.  The patient presents today for left arm fistulogram, possible intervention.  Patient has concerns with "bumps on his fistula".  He was referred for fistulogram due to recurrent issues with cannulation and bleeding..    Past Medical History:  Diagnosis Date  . Adenomatous colon polyp    tubular  . Anemia   . Anxiety   . Arthritis    left shoulder  . Atherosclerosis of aorta ([place])   . Cardiomegaly   . Chest pain    DATE UNKNOWN, C/O PERIODICALLY  . Chronic kidney disease    dialysis - M/W/F  . Cocaine abuse ([place])   . COPD exacerbation ([place]) 08/17/2016  . Dialysis patient ([place])    Monday-Wednesday-Friday  . GERD (gastroesophageal reflux disease)    DATE UNKNOWN  . Hemorrhoids   . Hepatitis B, chronic ([place])   . Hepatitis C   . Hyperkalemia   . Hypertension   . Metabolic bone disease    Patient denies  . Nephrolithiasis   . Pneumonia   . Pulmonary edema   . Renal disorder   . Renal insufficiency   . Shortness of breath dyspnea    " for the last past year with this dialysis"  . Tubular adenoma of colon     Past Surgical History:  Procedure Laterality Date  . AV FISTULA PLACEMENT  2012   BELIEVED WAS PLACED IN JUNE  . COLONOSCOPY    . CORONARY STENT INTERVENTION N/A 02/22/2017   Procedure: CORONARY STENT INTERVENTION;  Surgeon: Nigel Mormon, MD;  Location: [facility];  Service: Cardiovascular;  Laterality: N/A;  . HEMORRHOID BANDING    . LEFT HEART CATH AND CORONARY ANGIOGRAPHY N/A 02/22/2017   Procedure: LEFT HEART CATH AND CORONARY ANGIOGRAPHY;  Surgeon: Nigel Mormon, MD;  Location: [facility];  Service: Cardiovascular;  Laterality: N/A;  . LIGATION OF ARTERIOVENOUS  FISTULA Left 0/03/4708   Procedure: Plication of Left Arm Arteriovenous Fistula;  Surgeon: Elam Dutch, MD;   Location: [facility];  Service: Vascular;  Laterality: Left;  . POLYPECTOMY    . REVISON OF ARTERIOVENOUS FISTULA Left 01/06/3661   Procedure: PLICATION OF DISTAL ANEURYSMAL SEGEMENT OF LEFT UPPER ARM ARTERIOVENOUS FISTULA;  Surgeon: Elam Dutch, MD;  Location: [facility];  Service: Vascular;  Laterality: Left;  . REVISON OF ARTERIOVENOUS FISTULA Left 9/47/6546   Procedure: Plication of Left Upper Arm Fistula ;  Surgeon: Waynetta Sandy, MD;  Location[facility];  Service: Vascular;  Laterality: Left;    Social History   Socioeconomic History  . Marital status: Single    Spouse name: Not on file  . Number of children: 3  . Years of education: 10  . Highest education level: Not on file  Social Needs  . Financial resource strain: Not on file  . Food insecurity - worry: Not on file  . Food insecurity - inability: Not on file  . Transportation needs - medical: Not on file  . Transportation needs - non-medical: Not on file  Occupational History  . Occupation: Unemployed  Tobacco Use  . Smoking status: Current Every Day Smoker    Packs/day: 0.50    Years: 43.00    Pack years: 21.50    Types: Cigarettes    Start date: 08/13/1973  . Smokeless tobacco:  Never Used  . Tobacco comment: Less than 1/2 pk per day  Substance and Sexual Activity  . Alcohol use: Yes    Alcohol/week: 2.4 oz    Types: 1 Glasses of wine, 1 Cans of beer, 1 Shots of liquor, 1 Standard drinks or equivalent per week    Comment: occasional   . Drug use: Yes    Types: Marijuana, Cocaine  . Sexual activity: Not on file    Comment: "NOT LATELY" on cocaine  Other Topics Concern  . Not on file  Social History Narrative   Lives alone   Caffeine use: Coffee-rare   Soda- daily      [facility] (03/10/17):   Originally from [facility]. Previously worked trimming trees. No pets currently. No bird or mold exposure.     Family History  Problem Relation Age of Onset  . Heart disease Mother   . Lung cancer Mother   .  Heart disease Father   . Malignant hyperthermia Father   . COPD Father   . Throat cancer Sister   . Esophageal cancer Sister   . Hypertension Other   . COPD Other   . Colon cancer Neg Hx   . Colon polyps Neg Hx   . Rectal cancer Neg Hx   . Stomach cancer Neg Hx     Current Facility-Administered Medications  Medication Dose Route Frequency Provider Last Rate Last Dose  . 0.9 %  sodium chloride infusion  250 mL Intravenous PRN Conrad [place], MD      . sodium chloride flush (NS) 0.9 % injection 3 mL  3 mL Intravenous Q12H Adele Barthel L, MD      . sodium chloride flush (NS) 0.9 % injection 3 mL  3 mL Intravenous PRN Conrad Bridgman, MD        Allergies  Allergen Reactions  . Aspirin Other (See Comments)    STOMACH PAIN  . Clonidine Derivatives Itching  . Tramadol Itching    Review of Systems: As listed above, otherwise negative.   Physical Examination   Vitals:   05/26/17 0759  BP: (!) 142/74  Pulse: (!) 109  Temp: (!) 97.4 F (36.3 C)  TempSrc: Oral  SpO2: 99%  Weight: 170 lb (77.1 kg)  Height: 5\' 9"  (1.753 m)   Body mass index is 25.1 kg/m.  General Alert, O x 3, WD, NAD  Pulmonary Sym exp, good B air movt, CTA B  Cardiac RRR, Nl S1, S2, no Murmurs, No rubs, No S3,S4  Musculo- skeletal L arm fistula with two moderate sized and one small pseudoaneurysms  Neurologic Pain and light touch intact in extremities ,     Laboratory  See [place] is a 56 y.o. male who presents with: flow rate reduction in left arm fistula and multiple pseudoaneurysms.   The patient is scheduled for: L arm fistulogram, possible intervention. I discussed with the patient the nature of angiographic procedures, especially the limited patencies of any endovascular intervention.  The patient is aware of that the risks of an angiographic procedure include but are not limited to: bleeding, infection, access site complications, renal failure,  embolization, rupture of vessel, dissection, possible need for emergent surgical intervention, possible need for surgical procedures to treat the patient's pathology, and stroke and death.    The patient is aware of the risks and agrees to proceed.   Adele Barthel, MD, FACS [facility] Office:  [phone] Pager: [phone]  05/26/2017, 11:02 AM

## 2017-05-26 NOTE — Op Note (Signed)
    OPERATIVE NOTE   PROCEDURE: 1.  left brachiocephalic arteriovenous fistula cannulation under ultrasound guidance 2.  left arm shuntogram  PRE-OPERATIVE DIAGNOSIS: left brachiocephalic arteriovenous fistula with difficult cannulation and bleeding   POST-OPERATIVE DIAGNOSIS: same as above   SURGEON: Adele Barthel, MD  ANESTHESIA: local  ESTIMATED BLOOD LOSS: 50 cc  FINDING(S): 1. Widely patent central venous system: <50% stenosis at cephalic vein/subclavian vein confluence 2. Patent fistula with >6 mm diameter throughout: small pseudoaneurysm proximally, moderate bilobate pseudoaneurysm in mid-segment and moderate sized broad pseudoaneurysm distally 3. Widely patent anastomosis  SPECIMEN(S):  None  CONTRAST: 25 cc   INDICATIONS: [name] is a 56 y.o. male who presents with left brachiocephalic arteriovenous fistula with poor cannulation and bleeding.  The patient is scheduled for left arm shuntogram.  The patient is aware the risks include but are not limited to: bleeding, infection, thrombosis of the cannulated access, and possible anaphylactic reaction to the contrast.  The patient is aware of the risks of the procedure and elects to proceed forward.   DESCRIPTION: After full informed written consent was obtained, the patient was brought back to the angiography suite and placed supine upon the angiography table.  The patient was connected to monitoring equipment.  The left upper arm was prepped and draped in the standard fashion for a left arm shuntogram.  Under ultrasound guidance, the left brachiocephalic arteriovenous fistula was cannulated with a micropuncture needle.  The microwire was advanced into the fistula and the needle was exchanged for the a microsheath, which was lodged 2 cm into the access.  The wire was removed and the sheath was connected to the IV extension tubing.  Hand injections were completed to image the access from the cannulation site up the right  atrium.  Based on the images, this patient will need: plication of his pseudoaneurysms.  A 4-0 Monocryl purse-string suture was sewn around the sheath.  The sheath was removed while tying down the suture.  A sterile bandage was applied to the puncture site.   COMPLICATIONS: none  CONDITION: stable   Adele Barthel, MD, [facility] [place] Office: [phone] Pager: [phone]  05/26/2017 11:29 AM

## 2017-05-27 ENCOUNTER — Telehealth: Payer: Self-pay | Admitting: *Deleted

## 2017-05-27 ENCOUNTER — Other Ambulatory Visit: Payer: Self-pay | Admitting: *Deleted

## 2017-05-27 DIAGNOSIS — D631 Anemia in chronic kidney disease: Secondary | ICD-10-CM | POA: Diagnosis not present

## 2017-05-27 DIAGNOSIS — D509 Iron deficiency anemia, unspecified: Secondary | ICD-10-CM | POA: Diagnosis not present

## 2017-05-27 DIAGNOSIS — N186 End stage renal disease: Secondary | ICD-10-CM | POA: Diagnosis not present

## 2017-05-27 DIAGNOSIS — N2581 Secondary hyperparathyroidism of renal origin: Secondary | ICD-10-CM | POA: Diagnosis not present

## 2017-05-27 NOTE — Progress Notes (Signed)
Left msg. For patient to call office for information for surgery 06/09/17.

## 2017-05-27 NOTE — Telephone Encounter (Signed)
Call to patient to be at [facility] at [place] 06/09/17  for procedure. NPO past MN. Will need driver for trip home after surgery. Follow detailed instructions he will receive from the [facility] Pre -Admission Testing department about this surgery. Verbalized understanding.

## 2017-05-28 DIAGNOSIS — E8770 Fluid overload, unspecified: Secondary | ICD-10-CM | POA: Diagnosis not present

## 2017-05-28 DIAGNOSIS — N186 End stage renal disease: Secondary | ICD-10-CM | POA: Diagnosis not present

## 2017-05-28 DIAGNOSIS — N2581 Secondary hyperparathyroidism of renal origin: Secondary | ICD-10-CM | POA: Diagnosis not present

## 2017-05-29 ENCOUNTER — Encounter (HOSPITAL_COMMUNITY): Payer: Self-pay

## 2017-05-29 ENCOUNTER — Other Ambulatory Visit: Payer: Self-pay

## 2017-05-29 ENCOUNTER — Emergency Department (HOSPITAL_COMMUNITY): Payer: Medicare Other

## 2017-05-29 ENCOUNTER — Inpatient Hospital Stay (HOSPITAL_COMMUNITY)
Admission: EM | Admit: 2017-05-29 | Discharge: 2017-06-10 | DRG: 252 | Disposition: A | Discharge status: Other Acute Inpatient Hospital | Payer: Medicare Other | Attending: Family Medicine | Admitting: Family Medicine

## 2017-05-29 ENCOUNTER — Emergency Department (HOSPITAL_COMMUNITY)
Admission: EM | Admit: 2017-05-29 | Discharge: 2017-05-29 | Disposition: A | Discharge status: Home / Self Care | Payer: Medicare Other | Source: Home / Self Care | Attending: Emergency Medicine | Admitting: Emergency Medicine

## 2017-05-29 DIAGNOSIS — Z419 Encounter for procedure for purposes other than remedying health state, unspecified: Secondary | ICD-10-CM

## 2017-05-29 DIAGNOSIS — T82590A Other mechanical complication of surgically created arteriovenous fistula, initial encounter: Secondary | ICD-10-CM

## 2017-05-29 DIAGNOSIS — B181 Chronic viral hepatitis B without delta-agent: Secondary | ICD-10-CM | POA: Diagnosis present

## 2017-05-29 DIAGNOSIS — L986 Other infiltrative disorders of the skin and subcutaneous tissue: Secondary | ICD-10-CM | POA: Diagnosis present

## 2017-05-29 DIAGNOSIS — I252 Old myocardial infarction: Secondary | ICD-10-CM

## 2017-05-29 DIAGNOSIS — F17200 Nicotine dependence, unspecified, uncomplicated: Secondary | ICD-10-CM | POA: Diagnosis present

## 2017-05-29 DIAGNOSIS — I248 Other forms of acute ischemic heart disease: Secondary | ICD-10-CM | POA: Diagnosis present

## 2017-05-29 DIAGNOSIS — R071 Chest pain on breathing: Secondary | ICD-10-CM | POA: Diagnosis not present

## 2017-05-29 DIAGNOSIS — F17210 Nicotine dependence, cigarettes, uncomplicated: Secondary | ICD-10-CM | POA: Diagnosis present

## 2017-05-29 DIAGNOSIS — Z560 Unemployment, unspecified: Secondary | ICD-10-CM | POA: Diagnosis not present

## 2017-05-29 DIAGNOSIS — G4700 Insomnia, unspecified: Secondary | ICD-10-CM | POA: Diagnosis present

## 2017-05-29 DIAGNOSIS — F321 Major depressive disorder, single episode, moderate: Secondary | ICD-10-CM | POA: Diagnosis not present

## 2017-05-29 DIAGNOSIS — Z79899 Other long term (current) drug therapy: Secondary | ICD-10-CM

## 2017-05-29 DIAGNOSIS — I4891 Unspecified atrial fibrillation: Secondary | ICD-10-CM | POA: Diagnosis present

## 2017-05-29 DIAGNOSIS — D696 Thrombocytopenia, unspecified: Secondary | ICD-10-CM | POA: Diagnosis present

## 2017-05-29 DIAGNOSIS — K219 Gastro-esophageal reflux disease without esophagitis: Secondary | ICD-10-CM | POA: Diagnosis present

## 2017-05-29 DIAGNOSIS — J95859 Other complication of respirator [ventilator]: Secondary | ICD-10-CM | POA: Diagnosis not present

## 2017-05-29 DIAGNOSIS — T82898A Other specified complication of vascular prosthetic devices, implants and grafts, initial encounter: Secondary | ICD-10-CM | POA: Diagnosis not present

## 2017-05-29 DIAGNOSIS — R0609 Other forms of dyspnea: Secondary | ICD-10-CM

## 2017-05-29 DIAGNOSIS — B182 Chronic viral hepatitis C: Secondary | ICD-10-CM | POA: Diagnosis not present

## 2017-05-29 DIAGNOSIS — D62 Acute posthemorrhagic anemia: Secondary | ICD-10-CM | POA: Diagnosis not present

## 2017-05-29 DIAGNOSIS — R000 Tachycardia, unspecified: Secondary | ICD-10-CM

## 2017-05-29 DIAGNOSIS — I120 Hypertensive chronic kidney disease with stage 5 chronic kidney disease or end stage renal disease: Secondary | ICD-10-CM | POA: Insufficient documentation

## 2017-05-29 DIAGNOSIS — Z992 Dependence on renal dialysis: Secondary | ICD-10-CM

## 2017-05-29 DIAGNOSIS — Z452 Encounter for adjustment and management of vascular access device: Secondary | ICD-10-CM

## 2017-05-29 DIAGNOSIS — D631 Anemia in chronic kidney disease: Secondary | ICD-10-CM | POA: Diagnosis present

## 2017-05-29 DIAGNOSIS — F4323 Adjustment disorder with mixed anxiety and depressed mood: Secondary | ICD-10-CM | POA: Diagnosis not present

## 2017-05-29 DIAGNOSIS — R0600 Dyspnea, unspecified: Secondary | ICD-10-CM

## 2017-05-29 DIAGNOSIS — F329 Major depressive disorder, single episode, unspecified: Secondary | ICD-10-CM | POA: Diagnosis present

## 2017-05-29 DIAGNOSIS — J441 Chronic obstructive pulmonary disease with (acute) exacerbation: Secondary | ICD-10-CM

## 2017-05-29 DIAGNOSIS — Z7902 Long term (current) use of antithrombotics/antiplatelets: Secondary | ICD-10-CM | POA: Diagnosis not present

## 2017-05-29 DIAGNOSIS — Z955 Presence of coronary angioplasty implant and graft: Secondary | ICD-10-CM

## 2017-05-29 DIAGNOSIS — Z886 Allergy status to analgesic agent status: Secondary | ICD-10-CM | POA: Diagnosis not present

## 2017-05-29 DIAGNOSIS — I10 Essential (primary) hypertension: Secondary | ICD-10-CM | POA: Diagnosis not present

## 2017-05-29 DIAGNOSIS — F1410 Cocaine abuse, uncomplicated: Secondary | ICD-10-CM | POA: Diagnosis present

## 2017-05-29 DIAGNOSIS — Z9119 Patient's noncompliance with other medical treatment and regimen: Secondary | ICD-10-CM

## 2017-05-29 DIAGNOSIS — I361 Nonrheumatic tricuspid (valve) insufficiency: Secondary | ICD-10-CM | POA: Diagnosis not present

## 2017-05-29 DIAGNOSIS — I447 Left bundle-branch block, unspecified: Secondary | ICD-10-CM | POA: Diagnosis present

## 2017-05-29 DIAGNOSIS — I5022 Chronic systolic (congestive) heart failure: Secondary | ICD-10-CM | POA: Diagnosis present

## 2017-05-29 DIAGNOSIS — K5900 Constipation, unspecified: Secondary | ICD-10-CM | POA: Diagnosis present

## 2017-05-29 DIAGNOSIS — F602 Antisocial personality disorder: Secondary | ICD-10-CM | POA: Diagnosis present

## 2017-05-29 DIAGNOSIS — Z885 Allergy status to narcotic agent status: Secondary | ICD-10-CM | POA: Diagnosis not present

## 2017-05-29 DIAGNOSIS — R0602 Shortness of breath: Secondary | ICD-10-CM

## 2017-05-29 DIAGNOSIS — Z7982 Long term (current) use of aspirin: Secondary | ICD-10-CM | POA: Insufficient documentation

## 2017-05-29 DIAGNOSIS — I132 Hypertensive heart and chronic kidney disease with heart failure and with stage 5 chronic kidney disease, or end stage renal disease: Secondary | ICD-10-CM | POA: Diagnosis present

## 2017-05-29 DIAGNOSIS — Z888 Allergy status to other drugs, medicaments and biological substances status: Secondary | ICD-10-CM

## 2017-05-29 DIAGNOSIS — I4892 Unspecified atrial flutter: Secondary | ICD-10-CM | POA: Diagnosis not present

## 2017-05-29 DIAGNOSIS — E875 Hyperkalemia: Secondary | ICD-10-CM | POA: Diagnosis not present

## 2017-05-29 DIAGNOSIS — J449 Chronic obstructive pulmonary disease, unspecified: Secondary | ICD-10-CM | POA: Diagnosis present

## 2017-05-29 DIAGNOSIS — Z4901 Encounter for fitting and adjustment of extracorporeal dialysis catheter: Secondary | ICD-10-CM | POA: Diagnosis not present

## 2017-05-29 DIAGNOSIS — D509 Iron deficiency anemia, unspecified: Secondary | ICD-10-CM | POA: Diagnosis not present

## 2017-05-29 DIAGNOSIS — I509 Heart failure, unspecified: Secondary | ICD-10-CM | POA: Diagnosis present

## 2017-05-29 DIAGNOSIS — F419 Anxiety disorder, unspecified: Secondary | ICD-10-CM | POA: Diagnosis present

## 2017-05-29 DIAGNOSIS — T82590D Other mechanical complication of surgically created arteriovenous fistula, subsequent encounter: Secondary | ICD-10-CM | POA: Diagnosis not present

## 2017-05-29 DIAGNOSIS — R69 Illness, unspecified: Secondary | ICD-10-CM

## 2017-05-29 DIAGNOSIS — D72829 Elevated white blood cell count, unspecified: Secondary | ICD-10-CM | POA: Diagnosis present

## 2017-05-29 DIAGNOSIS — T82838A Hemorrhage of vascular prosthetic devices, implants and grafts, initial encounter: Principal | ICD-10-CM | POA: Diagnosis present

## 2017-05-29 DIAGNOSIS — R4587 Impulsiveness: Secondary | ICD-10-CM | POA: Diagnosis not present

## 2017-05-29 DIAGNOSIS — J410 Simple chronic bronchitis: Secondary | ICD-10-CM | POA: Diagnosis not present

## 2017-05-29 DIAGNOSIS — M1990 Unspecified osteoarthritis, unspecified site: Secondary | ICD-10-CM | POA: Diagnosis present

## 2017-05-29 DIAGNOSIS — I429 Cardiomyopathy, unspecified: Secondary | ICD-10-CM | POA: Diagnosis present

## 2017-05-29 DIAGNOSIS — N2581 Secondary hyperparathyroidism of renal origin: Secondary | ICD-10-CM | POA: Diagnosis not present

## 2017-05-29 DIAGNOSIS — R45851 Suicidal ideations: Secondary | ICD-10-CM | POA: Diagnosis not present

## 2017-05-29 DIAGNOSIS — X58XXXA Exposure to other specified factors, initial encounter: Secondary | ICD-10-CM | POA: Diagnosis not present

## 2017-05-29 DIAGNOSIS — F1210 Cannabis abuse, uncomplicated: Secondary | ICD-10-CM | POA: Diagnosis not present

## 2017-05-29 DIAGNOSIS — I97638 Postprocedural hematoma of a circulatory system organ or structure following other circulatory system procedure: Secondary | ICD-10-CM | POA: Diagnosis not present

## 2017-05-29 DIAGNOSIS — D6959 Other secondary thrombocytopenia: Secondary | ICD-10-CM | POA: Diagnosis present

## 2017-05-29 DIAGNOSIS — T827XXA Infection and inflammatory reaction due to other cardiac and vascular devices, implants and grafts, initial encounter: Secondary | ICD-10-CM | POA: Diagnosis not present

## 2017-05-29 DIAGNOSIS — N186 End stage renal disease: Secondary | ICD-10-CM

## 2017-05-29 DIAGNOSIS — Z716 Tobacco abuse counseling: Secondary | ICD-10-CM | POA: Diagnosis not present

## 2017-05-29 DIAGNOSIS — J42 Unspecified chronic bronchitis: Secondary | ICD-10-CM | POA: Diagnosis not present

## 2017-05-29 DIAGNOSIS — I739 Peripheral vascular disease, unspecified: Secondary | ICD-10-CM | POA: Diagnosis present

## 2017-05-29 DIAGNOSIS — E871 Hypo-osmolality and hyponatremia: Secondary | ICD-10-CM | POA: Diagnosis present

## 2017-05-29 LAB — COMPREHENSIVE METABOLIC PANEL
ALT: 43 U/L (ref 17–63)
AST: 64 U/L — ABNORMAL HIGH (ref 15–41)
Albumin: 4.2 g/dL (ref 3.5–5.0)
Alkaline Phosphatase: 152 U/L — ABNORMAL HIGH (ref 38–126)
Anion gap: 15 (ref 5–15)
BUN: 24 mg/dL — ABNORMAL HIGH (ref 6–20)
CO2: 26 mmol/L (ref 22–32)
Calcium: 10.3 mg/dL (ref 8.9–10.3)
Chloride: 89 mmol/L — ABNORMAL LOW (ref 101–111)
Creatinine, Ser: 5.51 mg/dL — ABNORMAL HIGH (ref 0.61–1.24)
GFR calc Af Amer: 12 mL/min — ABNORMAL LOW (ref >60)
GFR calc non Af Amer: 11 mL/min — ABNORMAL LOW (ref >60)
Glucose, Bld: 353 mg/dL — ABNORMAL HIGH (ref 65–99)
Potassium: 5.8 mmol/L — ABNORMAL HIGH (ref 3.5–5.1)
Sodium: 130 mmol/L — ABNORMAL LOW (ref 135–145)
Total Bilirubin: 1.2 mg/dL (ref 0.3–1.2)
Total Protein: 8.3 g/dL — ABNORMAL HIGH (ref 6.5–8.1)

## 2017-05-29 LAB — CBC WITH DIFFERENTIAL/PLATELET
Basophils Absolute: 0 10*3/uL (ref 0.0–0.1)
Basophils Relative: 0 %
Eosinophils Absolute: 0 10*3/uL (ref 0.0–0.7)
Eosinophils Relative: 0 %
HCT: 30.5 % — ABNORMAL LOW (ref 39.0–52.0)
Hemoglobin: 10.3 g/dL — ABNORMAL LOW (ref 13.0–17.0)
Lymphocytes Relative: 5 %
Lymphs Abs: 0.7 10*3/uL (ref 0.7–4.0)
MCH: 28.5 pg (ref 26.0–34.0)
MCHC: 33.8 g/dL (ref 30.0–36.0)
MCV: 84.5 fL (ref 78.0–100.0)
Monocytes Absolute: 0.2 10*3/uL (ref 0.1–1.0)
Monocytes Relative: 2 %
Neutro Abs: 12.5 10*3/uL — ABNORMAL HIGH (ref 1.7–7.7)
Neutrophils Relative %: 93 %
Platelets: 170 10*3/uL (ref 150–400)
RBC: 3.61 MIL/uL — ABNORMAL LOW (ref 4.22–5.81)
RDW: 18 % — ABNORMAL HIGH (ref 11.5–15.5)
WBC: 13.4 10*3/uL — ABNORMAL HIGH (ref 4.0–10.5)

## 2017-05-29 LAB — I-STAT TROPONIN, ED: Troponin i, poc: 0.07 ng/mL (ref 0.00–0.08)

## 2017-05-29 MED ORDER — LIDOCAINE-PRILOCAINE 2.5-2.5 % EX CREA: 1.0000 "application " | TOPICAL_CREAM | CUTANEOUS | Status: DC | PRN

## 2017-05-29 MED ORDER — HEPARIN SODIUM (PORCINE) 1000 UNIT/ML DIALYSIS
3000.0000 [IU] | Freq: Once | INTRAMUSCULAR | Status: DC
Start: 2017-05-29 — End: 2017-06-01

## 2017-05-29 MED ORDER — FENTANYL CITRATE (PF) 100 MCG/2ML IJ SOLN
25.0000 ug | Freq: Once | INTRAMUSCULAR | Status: AC
  Administered 2017-05-29: 25 ug via INTRAVENOUS
  Filled 2017-05-29: qty 2

## 2017-05-29 MED ORDER — HYDROMORPHONE HCL 1 MG/ML IJ SOLN
1.0000 mg | Freq: Once | INTRAMUSCULAR | Status: AC
  Administered 2017-05-29: 1 mg via INTRAVENOUS
  Filled 2017-05-29: qty 1

## 2017-05-29 MED ORDER — HYDROMORPHONE HCL 1 MG/ML IJ SOLN
0.5000 mg | Freq: Once | INTRAMUSCULAR | Status: AC
  Administered 2017-05-30: 0.5 mg via INTRAVENOUS
  Filled 2017-05-29 (×3): qty 1

## 2017-05-29 MED ORDER — ALBUTEROL SULFATE (2.5 MG/3ML) 0.083% IN NEBU
INHALATION_SOLUTION | RESPIRATORY_TRACT | Status: AC
  Administered 2017-05-29: 20:00:00
  Filled 2017-05-29: qty 6

## 2017-05-29 MED ORDER — METHYLPREDNISOLONE SODIUM SUCC 125 MG IJ SOLR
125.0000 mg | Freq: Once | INTRAMUSCULAR | Status: AC
  Administered 2017-05-29: 125 mg via INTRAVENOUS
  Filled 2017-05-29: qty 2

## 2017-05-29 MED ORDER — PENTAFLUOROPROP-TETRAFLUOROETH EX AERO: 1.0000 "application " | INHALATION_SPRAY | CUTANEOUS | Status: DC | PRN

## 2017-05-29 MED ORDER — SODIUM CHLORIDE 0.9 % IV SOLN
100.0000 mL | INTRAVENOUS | Status: DC | PRN
  Administered 2017-05-30 – 2017-06-01 (×2): via INTRAVENOUS

## 2017-05-29 MED ORDER — HEPARIN SODIUM (PORCINE) 1000 UNIT/ML DIALYSIS
1000.0000 [IU] | INTRAMUSCULAR | Status: DC | PRN
  Filled 2017-05-29: qty 1

## 2017-05-29 MED ORDER — IPRATROPIUM-ALBUTEROL 0.5-2.5 (3) MG/3ML IN SOLN
3.0000 mL | Freq: Once | RESPIRATORY_TRACT | Status: AC
  Administered 2017-05-29: 3 mL via RESPIRATORY_TRACT
  Filled 2017-05-29: qty 3

## 2017-05-29 MED ORDER — ALBUTEROL (5 MG/ML) CONTINUOUS INHALATION SOLN
INHALATION_SOLUTION | RESPIRATORY_TRACT | Status: AC
  Administered 2017-05-29: 20:00:00
  Filled 2017-05-29: qty 1

## 2017-05-29 MED ORDER — LIDOCAINE HCL (PF) 1 % IJ SOLN: 5.0000 mL | INTRAMUSCULAR | Status: DC | PRN

## 2017-05-29 MED ORDER — SODIUM CHLORIDE 0.9 % IV SOLN: 100.0000 mL | INTRAVENOUS | Status: DC | PRN

## 2017-05-29 MED ORDER — ALTEPLASE 2 MG IJ SOLR: 2.0000 mg | Freq: Once | INTRAMUSCULAR | Status: DC | PRN

## 2017-05-29 MED ORDER — ALBUTEROL SULFATE (2.5 MG/3ML) 0.083% IN NEBU
5.0000 mg | INHALATION_SOLUTION | Freq: Once | RESPIRATORY_TRACT | Status: AC
  Administered 2017-05-29: 5 mg via RESPIRATORY_TRACT

## 2017-05-29 MED ORDER — IPRATROPIUM-ALBUTEROL 0.5-2.5 (3) MG/3ML IN SOLN
RESPIRATORY_TRACT | Status: AC
  Administered 2017-05-29: 20:00:00
  Filled 2017-05-29: qty 3

## 2017-05-29 MED ORDER — HYDROMORPHONE HCL 1 MG/ML IJ SOLN
0.5000 mg | Freq: Once | INTRAMUSCULAR | Status: AC
  Administered 2017-05-29: 0.5 mg via INTRAVENOUS
  Filled 2017-05-29: qty 1

## 2017-05-29 MED ORDER — PREDNISONE 20 MG PO TABS: ORAL_TABLET | ORAL | 0 refills | Status: DC

## 2017-05-29 MED ORDER — MORPHINE SULFATE (PF) 4 MG/ML IV SOLN
4.0000 mg | Freq: Once | INTRAVENOUS | Status: AC
  Administered 2017-05-29: 4 mg via INTRAVENOUS
  Filled 2017-05-29: qty 1

## 2017-05-29 NOTE — ED Notes (Signed)
Respiratory at bedside to apply BiPAP 

## 2017-05-29 NOTE — ED Provider Notes (Signed)
[facility] EMERGENCY DEPARTMENT Provider Note   CSN: [id number] Arrival date & time: 05/29/17  1913     History   Chief Complaint Chief Complaint  Patient presents with  . Shortness of Breath    HPI [name] is a 56 y.o. male.  HPI   [name] past medical history significant for end-stage renal disease on dialysis.  Today's holiday schedule due to Thanksgiving.  He had normal dialysis today.  He reports increasingly short of breath at home.  Patient has history of COPD.  He took some home steroids but did not feel better, so he called EMS.  Patient reports that he coughed two large coughs and all of a sudden his left upper extremity swollen.  This is directly underneath his fistula.  Patient reports a lot of pain left lower extremity but also shortness of breath.  On arrival patient requesting BiPAP and DuoNeb's.  Past Medical History:  Diagnosis Date  . Adenomatous colon polyp    tubular  . Anemia   . Anxiety   . Arthritis    left shoulder  . Atherosclerosis of aorta ([place])   . Cardiomegaly   . Chest pain    DATE UNKNOWN, C/O PERIODICALLY  . Chronic kidney disease    dialysis - M/W/F  . Cocaine abuse ([place])   . COPD exacerbation ([place]) 08/17/2016  . Dialysis patient ([place])    Monday-Wednesday-Friday  . GERD (gastroesophageal reflux disease)    DATE UNKNOWN  . Hemorrhoids   . Hepatitis B, chronic ([place])   . Hepatitis C   . Hyperkalemia   . Hypertension   . Metabolic bone disease    Patient denies  . Nephrolithiasis   . Pneumonia   . Pulmonary edema   . Renal disorder   . Renal insufficiency   . Shortness of breath dyspnea    " for the last past year with this dialysis"  . Tubular adenoma of colon     Patient Active Problem List   Diagnosis Date Noted  . Hyperkalemia 05/22/2017  . Acute metabolic encephalopathy   . Anemia 04/23/2017  . Ascites 04/23/2017  . COPD (chronic obstructive pulmonary disease) ([place])  04/23/2017  . Acute on chronic respiratory failure with hypoxia ([place]) 03/25/2017  . Atrial flutter ([place]) 03/25/2017  . Arrhythmia 03/25/2017  . COPD GOLD II/ still smoking 09/27/2016  . Essential hypertension 09/27/2016  . Fluid overload 08/30/2016  . COPD exacerbation ([place]) 08/17/2016  . Hypertensive urgency 08/17/2016  . Respiratory failure ([place]) 08/17/2016  . Problem with dialysis access ([place]) 07/23/2016  . End stage renal disease ([place]) 12/02/2015  . Chronic hepatitis B ([place]) 03/05/2014  . Chronic hepatitis C without hepatic coma ([place]) 03/05/2014  . Internal hemorrhoids with bleeding, swelling and itching 03/05/2014  . Thrombocytopenia ([place]) 03/05/2014  . Chest pain 02/27/2014  . Alcohol abuse 04/14/2009  . Cigarette smoker 04/14/2009  . GANGLION CYST 04/14/2009    Past Surgical History:  Procedure Laterality Date  . A/V FISTULAGRAM Left 05/26/2017   Performed by Conrad Shoreview, MD at [facility]  . AV FISTULA PLACEMENT  2012   BELIEVED WAS PLACED IN JUNE  . COLONOSCOPY    . CORONARY STENT INTERVENTION N/A 02/22/2017   Performed by Nigel Mormon, MD at [facility]  . HEMORRHOID BANDING    . LEFT HEART CATH AND CORONARY ANGIOGRAPHY N/A 02/22/2017   Performed by Nigel Mormon, MD at [facility]  . PLICATION OF DISTAL ANEURYSMAL SEGEMENT OF LEFT UPPER ARM ARTERIOVENOUS FISTULA Left 01/20/2016   Performed by Elam Dutch, MD at [facility]  . Plication of Left Arm Arteriovenous Fistula Left 12/14/2015   Performed by Elam Dutch, MD at [facility]  . Plication of Left Upper Arm Fistula  Left 07/24/2016   Performed by Waynetta Sandy, MD at [facility]  . POLYPECTOMY         Home Medications    Prior to Admission medications   Medication Sig Start Date End Date Taking? Authorizing Provider  acetaminophen (TYLENOL) 500 MG tablet Take 500-1,000 mg every 6 (six) hours as needed by mouth for headache (pain).    Yes [provider]    albuterol (PROVENTIL HFA;VENTOLIN HFA) 108 (90 Base) MCG/ACT inhaler Inhale 2 puffs into the lungs every 6 (six) hours as needed for wheezing or shortness of breath. 03/10/17  Yes Javier Glazier, MD  aspirin EC 81 MG tablet Take 81 mg by mouth daily.   Yes [provider]  clopidogrel (PLAVIX) 75 MG tablet Take one tablet (75mg ) daily. 04/07/17  Yes [provider]  gabapentin (NEURONTIN) 100 MG capsule Take 100 mg 2 (two) times daily by mouth.   Yes [provider]  minoxidil (LONITEN) 10 MG tablet Take 10 mg 2 (two) times daily as needed by mouth (when blood pressure cuff reads "above normal").  05/02/17  Yes [provider]  nicotine (NICODERM CQ - DOSED IN MG/24 HOURS) 14 mg/24hr patch Place 14 mg daily onto the skin. 03/30/17  Yes [provider]  ondansetron (ZOFRAN) 4 MG tablet Take 4 mg every 8 (eight) hours as needed by mouth for nausea or vomiting.  04/27/17  Yes [provider]  oxycodone (OXY-IR) 5 MG capsule Take 5 mg 2 (two) times daily as needed by mouth for pain.   Yes [provider]  predniSONE (DELTASONE) 20 MG tablet 3 tabs po daily x 3 days, then 2 tabs x 3 days, then 1.5 tabs x 3 days, then 1 tab x 3 days, then 0.5 tabs x 3 days Patient taking differently: Take 10-60 mg See admin instructions by mouth. Take 3 tabs (60 mg) by mouth daily x 3 days, then 2 tabs (40 mg) x 3 days, then 1.5 tabs (30 mg) x 3 days, then 1 tab (20 mg) x 3 days, then 0.5 tabs (10 mg) x 3 days, then stop 05/29/17  Yes Pfeiffer, Jeannie Done, MD  sevelamer carbonate (RENVELA) 800 MG tablet Take 3 tablets (2,400 mg total) by mouth 3 (three) times daily with meals. Patient taking differently: Take 3,200 mg 3 (three) times daily with meals by mouth.  03/26/17  Yes Cherene Altes, MD  calcitRIOL (ROCALTROL) 0.25 MCG capsule Take 3 capsules (0.75 mcg total) by mouth every Monday, Wednesday, and Friday with hemodialysis. 03/28/17   Cherene Altes, MD   predniSONE (DELTASONE) 20 MG tablet Take 2 tablets (40 mg total) daily by mouth. Patient not taking: Reported on 05/29/2017 05/22/17   Blanchie Dessert, MD  Spacer/Aero-Holding Chambers (AEROCHAMBER MV) inhaler Use as instructed 03/10/17   Javier Glazier, MD    Family History Family History  Problem Relation Age of Onset  . Heart disease Mother   . Lung cancer Mother   . Heart disease Father   . Malignant hyperthermia Father   . COPD Father   . Throat cancer Sister   . Esophageal cancer Sister   .  Hypertension Other   . COPD Other   . Colon cancer Neg Hx   . Colon polyps Neg Hx   . Rectal cancer Neg Hx   . Stomach cancer Neg Hx     Social History Social History   Tobacco Use  . Smoking status: Current Every Day Smoker    Packs/day: 0.50    Years: 43.00    Pack years: 21.50    Types: Cigarettes    Start date: 08/13/1973  . Smokeless tobacco: Never Used  . Tobacco comment: Less than 1/2 pk per day  Substance Use Topics  . Alcohol use: Yes    Alcohol/week: 2.4 oz    Types: 1 Glasses of wine, 1 Cans of beer, 1 Shots of liquor, 1 Standard drinks or equivalent per week    Comment: occasional   . Drug use: Yes    Types: Marijuana, Cocaine     Allergies   Aspirin; Clonidine derivatives; and Tramadol   Review of Systems Review of Systems  Constitutional: Positive for fatigue. Negative for activity change.  HENT: Negative for congestion.   Respiratory: Positive for cough and shortness of breath.   Cardiovascular: Negative for chest pain.  Gastrointestinal: Negative for abdominal pain.     Physical Exam Updated Vital Signs BP (!) 194/91   Pulse (!) 116   Resp (!) 21   Wt 77.1 kg (170 lb)   SpO2 93%   BMI 25.10 kg/m   Physical Exam  Constitutional: He is oriented to person, place, and time. He appears well-nourished.  HENT:  Head: Normocephalic and atraumatic.  Eyes: Conjunctivae and EOM are normal. Pupils are equal, round, and reactive to light.   Cardiovascular:  tachycardia  Pulmonary/Chest: No tachypnea. No respiratory distress. He has decreased breath sounds.  Tachypnic, mild wheezing rhonchi  Musculoskeletal:  LUE swelling as pictured.   Neurological: He is oriented to person, place, and time.  Skin: Skin is warm and dry. He is not diaphoretic.  Psychiatric: He has a normal mood and affect. He is agitated.        ED Treatments / Results  Labs (all labs ordered are listed, but only abnormal results are displayed) Labs Reviewed  CBC WITH DIFFERENTIAL/PLATELET - Abnormal; Notable for the following components:      Result Value   WBC 13.4 (*)    RBC 3.61 (*)    Hemoglobin 10.3 (*)    HCT 30.5 (*)    RDW 18.0 (*)    Neutro Abs 12.5 (*)    All other components within normal limits  COMPREHENSIVE METABOLIC PANEL - Abnormal; Notable for the following components:   Sodium 130 (*)    Potassium 5.8 (*)    Chloride 89 (*)    Glucose, Bld 353 (*)    BUN 24 (*)    Creatinine, Ser 5.51 (*)    Total Protein 8.3 (*)    AST 64 (*)    Alkaline Phosphatase 152 (*)    GFR calc non Af Amer 11 (*)    GFR calc Af Amer 12 (*)    All other components within normal limits  I-STAT TROPONIN, ED    EKG  EKG Interpretation  Date/Time:  Sunday May 29 2017 19:18:40 EST Ventricular Rate:  120 PR Interval:    QRS Duration: 171 QT Interval:  370 QTC Calculation: 523 R Axis:   -79 Text Interpretation:  Sinus tachycardia Left bundle branch block similar to prior, but faster  Confirmed by Athalene Kolle [phone]) on  05/29/2017 9:21:06 PM       Radiology Dg Chest Portable 1 View  Result Date: 05/29/2017 CLINICAL DATA:  Shortness of Breath EXAM: PORTABLE CHEST 1 VIEW COMPARISON:  05/22/2017 FINDINGS: Cardiac shadow remains enlarged. Aortic calcifications are again seen. The lungs are well aerated bilaterally with mild interstitial changes consistent with early edema. No focal infiltrate or sizable effusion is noted.  IMPRESSION: Mild interstitial edema. Electronically Signed   By: Inez Catalina M.D.   On: 05/29/2017 20:29    Procedures Procedures (including critical care time)  CRITICAL CARE Performed by: Gardiner Sleeper Total critical care time: 60 minutes Critical care time was exclusive of separately billable procedures and treating other patients. Critical care was necessary to treat or prevent imminent or life-threatening deterioration. Critical care was time spent personally by me on the following activities: development of treatment plan with patient and/or surrogate as well as nursing, discussions with consultants, evaluation of patient's response to treatment, examination of patient, obtaining history from patient or surrogate, ordering and performing treatments and interventions, ordering and review of laboratory studies, ordering and review of radiographic studies, pulse oximetry and re-evaluation of patient's condition.   Medications Ordered in ED Medications  albuterol (PROVENTIL) (2.5 MG/3ML) 0.083% nebulizer solution (  Given 05/29/17 1930)  ipratropium-albuterol (DUONEB) 0.5-2.5 (3) MG/3ML nebulizer solution (  Given 05/29/17 1930)  albuterol (PROVENTIL, VENTOLIN) (5 MG/ML) 0.5% continuous inhalation solution (  Given 05/29/17 1930)  methylPREDNISolone sodium succinate (SOLU-MEDROL) 125 mg/2 mL injection 125 mg (125 mg Intravenous Given 05/29/17 1946)  albuterol (PROVENTIL) (2.5 MG/3ML) 0.083% nebulizer solution 5 mg (5 mg Nebulization Given by Other 05/29/17 1950)  fentaNYL (SUBLIMAZE) injection 25 mcg (25 mcg Intravenous Given 05/29/17 1958)  morphine 4 MG/ML injection 4 mg (4 mg Intravenous Given 05/29/17 2030)     Initial Impression / Assessment and Plan / ED Course  I have reviewed the triage vital signs and the nursing notes.  Pertinent labs & imaging results that were available during my care of the patient were reviewed by me and considered in my medical decision making  (see chart for details).     [name] past medical history significant for end-stage renal disease on dialysis.  Today's holiday schedule due to Thanksgiving.  He had normal dialysis today.  He reports increasingly short of breath at home.  Patient has history of COPD.  He took some home steroids but did not feel better, so he called EMS.  Patient reports that he coughed two large coughs and all of a sudden his left upper extremity swollen.  This is directly underneath his fistula.  Patient reports a lot of pain left lower extremity but also shortness of breath.  On arrival patient requesting BiPAP and DuoNeb's.  On arrival immediately called Dr. Bridgett Larsson from vascular.  He recommended no acute intervention currently.  Patient placed on BiPAP, given duo nebs.  Improved symptoms of breathing.  Patient has mild interstitial edema, potassium is 5.8.  STill SOB.  Patient has history of COPD, however there is not a lot of wheezing today.  I am concerned that it could be fluid overload causing his shortness of breath.    Dr. Jonnie Finner from nephrology called and made aware.  Patient placed on BiPAP.  Renal to come see the emergency department, will admit to stepdown.  Final Clinical Impressions(s) / ED Diagnoses   Final diagnoses:  None    ED Discharge Orders    None  Macarthur Critchley, MD 05/29/17 [phone]

## 2017-05-29 NOTE — Consult Note (Signed)
Renal Service Consult Note [facility]  [name] 05/29/2017 [place] Requesting Physician:  Dr Thomasene Lot  Reason for Consult:  SOB and chest pain HPI: The patient is a 56 y.o. year-old with hx of anemia, drug abuse (cocaine/ etoh/ others), COPD, chronic hep B and C, HTN , CAD w recent PCI to RCA Aug 2018, and ESRD on HD MWF who presented to ED this am after HD with SOB and was treated for COPD and sent home.  He returns tonight w/ more coughing and worsening SOB, diaphoresis. In ED K 5.8, CXR showing early pulm edema. Pt in resp distress and put on bipap. BP's high 194/91. EKG showed sinus tach at 120 bpm w/ LBBB (old).  Asked to see for ESRD.    PT denies any CP, abd pain.  No fever or prod cough.  +SOB. Last 4 HD sessions has left above EDW by 4- 7kg.  Wt gains typically 3-4kg.  Signed off at 2 hrs two of last 4 sessions.   Admitted: Feb 2018 - acute resp failure/ pulm edema. Had HD then left AMA.   Feb 2018 - acute resp failure/ HTN urgency/ fluid overload - improved with HD. Left AMA.  Aug 2018 - admitted w/ CP, underwent heart cath w/ 90% prox RCA, stented on 8/14.  afib w RVR, known HTN CM and LBBB.  Started on BB. Started on warfarin.  Sept 2018 - admitted with SOB/ pulm edema, missing full rx' on HD as OP. Had HD overnight and then left AMA in the morning.  Oct 2018 - presented with SOB and acute resp failure/ pulm edema.  Improved after HD. AMS resolved. Mild hyperkalemia. abd distension/ ascites, not able to tap w/o a large single pocket.     Home meds: -minoxidil 10 bid -proventil/ rocaltrol tiw/ plavix 75/ nicoderm/ zofran prn/ pred taper/ renvela ac    ROS  denies CP  no joint pain   no HA  no blurry vision  no rash  no diarrhea  no nausea/ vomiting   Past Medical History  Past Medical History:  Diagnosis Date  . Adenomatous colon polyp    tubular  . Anemia   . Anxiety   . Arthritis    left shoulder  . Atherosclerosis of aorta  ([place])   . Cardiomegaly   . Chest pain    DATE UNKNOWN, C/O PERIODICALLY  . Chronic kidney disease    dialysis - M/W/F  . Cocaine abuse ([place])   . COPD exacerbation ([place]) 08/17/2016  . Dialysis patient ([place])    Monday-Wednesday-Friday  . GERD (gastroesophageal reflux disease)    DATE UNKNOWN  . Hemorrhoids   . Hepatitis B, chronic ([place])   . Hepatitis C   . Hyperkalemia   . Hypertension   . Metabolic bone disease    Patient denies  . Nephrolithiasis   . Pneumonia   . Pulmonary edema   . Renal disorder   . Renal insufficiency   . Shortness of breath dyspnea    " for the last past year with this dialysis"  . Tubular adenoma of colon    Past Surgical History  Past Surgical History:  Procedure Laterality Date  . A/V FISTULAGRAM Left 05/26/2017   Performed by Conrad [place], MD at [place] [facility]  . AV FISTULA PLACEMENT  2012   BELIEVED WAS PLACED IN JUNE  . COLONOSCOPY    . CORONARY STENT INTERVENTION N/A 02/22/2017   Performed by Vernell Leep  J, MD at [place] [facility]  . HEMORRHOID BANDING    . LEFT HEART CATH AND CORONARY ANGIOGRAPHY N/A 02/22/2017   Performed by Nigel Mormon, MD at [place] [facility]  . PLICATION OF DISTAL ANEURYSMAL SEGEMENT OF LEFT UPPER ARM ARTERIOVENOUS FISTULA Left 01/20/2016   Performed by Elam Dutch, MD at [place]  . Plication of Left Arm Arteriovenous Fistula Left 12/14/2015   Performed by Elam Dutch, MD at [place]  . Plication of Left Upper Arm Fistula  Left 07/24/2016   Performed by Waynetta Sandy, MD at [place]  . POLYPECTOMY     Family History  Family History  Problem Relation Age of Onset  . Heart disease Mother   . Lung cancer Mother   . Heart disease Father   . Malignant hyperthermia Father   . COPD Father   . Throat cancer Sister   . Esophageal cancer Sister   . Hypertension Other   . COPD Other   . Colon cancer Neg Hx   . Colon polyps Neg Hx   . Rectal cancer Neg Hx   . Stomach cancer Neg Hx     Social History  reports that he has been smoking cigarettes.  He started smoking about 43 years ago. He has a 21.50 pack-year smoking history. he has never used smokeless tobacco. He reports that he drinks about 2.4 oz of alcohol per week. He reports that he uses drugs. Drugs: Marijuana and Cocaine. Allergies  Allergies  Allergen Reactions  . Aspirin Other (See Comments)    STOMACH PAIN  . Clonidine Derivatives Itching  . Tramadol Itching   Home medications Prior to Admission medications   Medication Sig Start Date End Date Taking? Authorizing Provider  acetaminophen (TYLENOL) 500 MG tablet Take 500-1,000 mg every 6 (six) hours as needed by mouth for headache (pain).    Yes [provider]  albuterol (PROVENTIL HFA;VENTOLIN HFA) 108 (90 Base) MCG/ACT inhaler Inhale 2 puffs into the lungs every 6 (six) hours as needed for wheezing or shortness of breath. 03/10/17  Yes Javier Glazier, MD  aspirin EC 81 MG tablet Take 81 mg by mouth daily.   Yes [provider]  clopidogrel (PLAVIX) 75 MG tablet Take one tablet (75mg ) daily. 04/07/17  Yes [provider]  gabapentin (NEURONTIN) 100 MG capsule Take 100 mg 2 (two) times daily by mouth.   Yes [provider]  minoxidil (LONITEN) 10 MG tablet Take 10 mg 2 (two) times daily as needed by mouth (when blood pressure cuff reads "above normal").  05/02/17  Yes [provider]  nicotine (NICODERM CQ - DOSED IN MG/24 HOURS) 14 mg/24hr patch Place 14 mg daily onto the skin. 03/30/17  Yes [provider]  ondansetron (ZOFRAN) 4 MG tablet Take 4 mg every 8 (eight) hours as needed by mouth for nausea or vomiting.  04/27/17  Yes [provider]  oxycodone (OXY-IR) 5 MG capsule Take 5 mg 2 (two) times daily as needed by mouth for pain.   Yes [provider]  predniSONE (DELTASONE) 20 MG tablet 3 tabs po daily x 3 days, then 2 tabs x 3 days, then 1.5 tabs x 3 days, then 1 tab x 3 days, then  0.5 tabs x 3 days Patient taking differently: Take 10-60 mg See admin instructions by mouth. Take 3 tabs (60 mg) by mouth daily x 3 days, then 2 tabs (40 mg) x 3 days, then 1.5  tabs (30 mg) x 3 days, then 1 tab (20 mg) x 3 days, then 0.5 tabs (10 mg) x 3 days, then stop 05/29/17  Yes Pfeiffer, Jeannie Done, MD  sevelamer carbonate (RENVELA) 800 MG tablet Take 3 tablets (2,400 mg total) by mouth 3 (three) times daily with meals. Patient taking differently: Take 3,200 mg 3 (three) times daily with meals by mouth.  03/26/17  Yes Cherene Altes, MD  calcitRIOL (ROCALTROL) 0.25 MCG capsule Take 3 capsules (0.75 mcg total) by mouth every Monday, Wednesday, and Friday with hemodialysis. 03/28/17   Cherene Altes, MD  predniSONE (DELTASONE) 20 MG tablet Take 2 tablets (40 mg total) daily by mouth. Patient not taking: Reported on 05/29/2017 05/22/17   Blanchie Dessert, MD  Spacer/Aero-Holding Chambers (AEROCHAMBER MV) inhaler Use as instructed 03/10/17   Javier Glazier, MD   Liver Function Tests Recent Labs  Lab 05/29/17 1944  AST 64*  ALT 43  ALKPHOS 152*  BILITOT 1.2  PROT 8.3*  ALBUMIN 4.2   No results for input(s): LIPASE, AMYLASE in the last 168 hours. CBC Recent Labs  Lab 05/26/17 0852 05/29/17 1944  WBC  --  13.4*  NEUTROABS  --  12.5*  HGB 10.5* 10.3*  HCT 31.0* 30.5*  MCV  --  84.5  PLT  --  676   Basic Metabolic Panel Recent Labs  Lab 05/26/17 0852 05/29/17 1944  NA 129* 130*  K 5.3* 5.8*  CL 89* 89*  CO2  --  26  GLUCOSE 116* 353*  BUN 27* 24*  CREATININE 5.40* 5.51*  CALCIUM  --  10.3   Iron/TIBC/Ferritin/ %Sat    Component Value Date/Time   IRON 116 06/25/2011 0837   TIBC 277 06/25/2011 0837   FERRITIN 550 (H) 06/25/2011 0837   IRONPCTSAT 42 06/25/2011 0837    Vitals:   05/29/17 1930 05/29/17 1937 05/29/17 2030  BP:   (!) 194/91  Pulse: (!) 122  (!) 116  Resp: (!) 23  (!) 21  SpO2: 100%  93%  Weight:  77.1 kg (170 lb)    Exam Gen on bipap, not  in distress No rash, cyanosis or gangrene Sclera anicteric, throat clear  +JVD Chest coarse BS bilat, no wheezing RRR no rub or gallop, strong PMI Abd soft ntnd no mass or ascites +bs GU normal male MS no joint effusions or deformity Ext 1+ pretib edema / no wounds or ulcers Neuro is alert, Ox 3 , nf LUA -- there is tense edema of the LUA w/ new blistering medial LUA. AVF is aneurysmal but w/ good bruit. No infiltration noted.    Dialysis: MWF [facility] 4h   72.5kg  2/2  Hep 3000  LUA AVF -mircera 200 ug next 11/29 -venofer load 7 doses left 100 mg -sensipar 120 mg tiw w/ HD -left over edw last 4 HD (76 - 81kg post) -s/o early about 50% of session -Hb 9.5, tsat 20%, Ca 10.7, P 4, pth 1475   Impression: 1. Acute dyspnea - pulm edema on CXR, vol overload on exam. BP's high. Needs HD tonight, have written orders.  Has history of leaving AMA when he feels better after HD.  2. LUA edema - left arm looks swollen above the elbow w/ new blisters away from the AVF, not sure what the cause is.  Would suspect central venous stenosis, however, he just had fistulogram on 11/15 by Dr Bridgett Larsson which showed no central venous stenosis. Would consider VVS consult in am.  3. Hyperkalemia -  HD tonight 4. ESRD on HD. HD tonight as above,UF 3-4 L as tol.  5. Hepatitis B + - HD in isolation  6. Hx hep C 7. Hx substance abuse    Plan - as above.   [name] MD [employer] pager [phone]   05/29/2017, 9:25 PM

## 2017-05-29 NOTE — ED Triage Notes (Signed)
Pt from home with ems c.o sob with cough, large hematoma to dialysis site, pt states it happened when the pt coughed and then he started to become sob. Pt diaphoretic upon arrival with labored breathing.Pt completed dialysis today without complications. EDP at bedside.

## 2017-05-29 NOTE — ED Triage Notes (Signed)
Pt arrived via [facility] from home after completing dialysis today.  Acute onset left sided chest pain radiating to left arm.  Coarse wheezing throughout.  EMS gave 10mg  ALbuterol 0.5mg  Atrovent 125mg  Solumedrol

## 2017-05-29 NOTE — H&P (Signed)
TRH H&P   Patient Demographics:    [name], is a 56 y.o. male  MRN: [id number]   DOB - Oct 08, 1960  Admit Date - 05/29/2017  Outpatient Primary MD for the patient is [name], MD  Referring MD/NP/PA: Almus Leitz  Outpatient Specialists:  Dr. Pearson Grippe (nephrology) Dr. Ashok Cordia (pulmonary) Dr. Donzetta Matters, Dr. Donnetta Hutching (vascular)    Patient coming from: home  Chief Complaint  Patient presents with  . Shortness of Breath      HPI:    [name]  is a 56 y.o. male, w hypertension, coccaine abuse, Hep B/C, Copd, Cardiomyopathy (EF 40-45%),  ESRD on HD (M, W, F) , just had dialysis today and c/o dyspnea.  Pt took steroid at home and didn't feel better and called EMS.  Pt had slight cough but this was nonproductive.  Also noted that all of a sudden his left upper extremity was swollen.   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  Pt noted to have bp 194/91 .  Pt presented to ED due to dyspnea and left upper extremity swelling.   In ED, pt was started on duoneb and bipap for dyspnea  CXR =>  IMPRESSION: Mild interstitial edema.  EKG ST at 120, LAD, LBBB Trop negative  Na 130, K 5.8 Bun 24, Creatinne 5.51 Glucose 353 Ast 64, Alt 43  ED contacted vascular surgery Dr. Bridgett Larsson who apparently stated not intervention at this time.   ED contacted nephrology for evaluation of additional dialysis.    Pt will be admitted for dyspnea likely secondary to mild CHF, as well as Left upper ext swelling.       Review of systems:    In addition to the HPI above,  + slight orthopnea  No Fever-chills, No Headache, No changes with Vision or hearing, No problems swallowing food or Liquids, No  Chest pain, No Abdominal pain, No Nausea or Vommitting, Bowel movements are regular, No Blood in stool or Urine, No dysuria, No new skin rashes or bruises, No new joints pains-aches,  No new weakness, tingling, numbness in any extremity, No recent weight gain or loss, No polyuria, polydypsia or polyphagia, No significant Mental Stressors.  A full 10 point Review of Systems was done, except as stated above, all other Review of Systems were negative.   With Past History of the following :    Past Medical History:  Diagnosis Date  . Adenomatous colon polyp    tubular  . Anemia   . Anxiety   . Arthritis    left shoulder  . Atherosclerosis of aorta ([place])   . Cardiomegaly   . Chest pain    DATE UNKNOWN, C/O PERIODICALLY  . Chronic kidney disease    dialysis - M/W/F  .  Cocaine abuse ([place])   . COPD exacerbation ([place]) 08/17/2016  . Dialysis patient ([place])    Monday-Wednesday-Friday  . GERD (gastroesophageal reflux disease)    DATE UNKNOWN  . Hemorrhoids   . Hepatitis B, chronic ([place])   . Hepatitis C   . Hyperkalemia   . Hypertension   . Metabolic bone disease    Patient denies  . Nephrolithiasis   . Pneumonia   . Pulmonary edema   . Renal disorder   . Renal insufficiency   . Shortness of breath dyspnea    " for the last past year with this dialysis"  . Tubular adenoma of colon       Past Surgical History:  Procedure Laterality Date  . A/V FISTULAGRAM Left 05/26/2017   Performed by Conrad [place], MD at [place] [facility]  . AV FISTULA PLACEMENT  2012   BELIEVED WAS PLACED IN JUNE  . COLONOSCOPY    . CORONARY STENT INTERVENTION N/A 02/22/2017   Performed by Nigel Mormon, MD at [place] [facility]  . HEMORRHOID BANDING    . LEFT HEART CATH AND CORONARY ANGIOGRAPHY N/A 02/22/2017   Performed by Nigel Mormon, MD at [place] [facility]  . PLICATION OF DISTAL ANEURYSMAL SEGEMENT OF LEFT UPPER ARM ARTERIOVENOUS FISTULA Left 01/20/2016   Performed by Elam Dutch, MD at [place]  . Plication of Left Arm Arteriovenous Fistula Left 12/14/2015   Performed by Elam Dutch, MD at [place]  . Plication of Left Upper Arm Fistula  Left 07/24/2016   Performed by Waynetta Sandy, MD at [place]  . POLYPECTOMY        Social History:     Social History   Tobacco Use  . Smoking status: Current Every Day Smoker    Packs/day: 0.50    Years: 43.00    Pack years: 21.50    Types: Cigarettes    Start date: 08/13/1973  . Smokeless tobacco: Never Used  . Tobacco comment: Less than 1/2 pk per day  Substance Use Topics  . Alcohol use: Yes    Alcohol/week: 2.4 oz    Types: 1 Glasses of wine, 1 Cans of beer, 1 Shots of liquor, 1 Standard drinks or equivalent per week    Comment: occasional      Lives - at home  Mobility - walks by self    Family History :     Family History  Problem Relation Age of Onset  . Heart disease Mother   . Lung cancer Mother   . Heart disease Father   . Malignant hyperthermia Father   . COPD Father   . Throat cancer Sister   . Esophageal cancer Sister   . Hypertension Other   . COPD Other   . Colon cancer Neg Hx   . Colon polyps Neg Hx   . Rectal cancer Neg Hx   . Stomach cancer Neg Hx      Home Medications:   Prior to Admission medications   Medication Sig Start Date End Date Taking? Authorizing Provider  acetaminophen (TYLENOL) 500 MG tablet Take 500-1,000 mg every 6 (six) hours as needed by mouth for headache (pain).    Yes [provider]  albuterol (PROVENTIL HFA;VENTOLIN HFA) 108 (90 Base) MCG/ACT inhaler Inhale 2 puffs into the lungs every 6 (six) hours as needed for wheezing or shortness of breath. 03/10/17  Yes Javier Glazier, MD  aspirin EC 81 MG tablet  Take 81 mg by mouth daily.   Yes [provider]  clopidogrel (PLAVIX) 75 MG tablet Take one tablet (75mg ) daily. 04/07/17  Yes [provider]  gabapentin (NEURONTIN) 100 MG capsule Take 100 mg 2 (two) times  daily by mouth.   Yes [provider]  minoxidil (LONITEN) 10 MG tablet Take 10 mg 2 (two) times daily as needed by mouth (when blood pressure cuff reads "above normal").  05/02/17  Yes [provider]  nicotine (NICODERM CQ - DOSED IN MG/24 HOURS) 14 mg/24hr patch Place 14 mg daily onto the skin. 03/30/17  Yes [provider]  ondansetron (ZOFRAN) 4 MG tablet Take 4 mg every 8 (eight) hours as needed by mouth for nausea or vomiting.  04/27/17  Yes [provider]  oxycodone (OXY-IR) 5 MG capsule Take 5 mg 2 (two) times daily as needed by mouth for pain.   Yes [provider]  predniSONE (DELTASONE) 20 MG tablet 3 tabs po daily x 3 days, then 2 tabs x 3 days, then 1.5 tabs x 3 days, then 1 tab x 3 days, then 0.5 tabs x 3 days Patient taking differently: Take 10-60 mg See admin instructions by mouth. Take 3 tabs (60 mg) by mouth daily x 3 days, then 2 tabs (40 mg) x 3 days, then 1.5 tabs (30 mg) x 3 days, then 1 tab (20 mg) x 3 days, then 0.5 tabs (10 mg) x 3 days, then stop 05/29/17  Yes Pfeiffer, Jeannie Done, MD  sevelamer carbonate (RENVELA) 800 MG tablet Take 3 tablets (2,400 mg total) by mouth 3 (three) times daily with meals. Patient taking differently: Take 3,200 mg 3 (three) times daily with meals by mouth.  03/26/17  Yes Cherene Altes, MD  calcitRIOL (ROCALTROL) 0.25 MCG capsule Take 3 capsules (0.75 mcg total) by mouth every Monday, Wednesday, and Friday with hemodialysis. 03/28/17   Cherene Altes, MD  predniSONE (DELTASONE) 20 MG tablet Take 2 tablets (40 mg total) daily by mouth. Patient not taking: Reported on 05/29/2017 05/22/17   Blanchie Dessert, MD  Spacer/Aero-Holding Chambers (AEROCHAMBER MV) inhaler Use as instructed 03/10/17   Javier Glazier, MD     Allergies:     Allergies  Allergen Reactions  . Aspirin Other (See Comments)    STOMACH PAIN  . Clonidine Derivatives Itching  . Tramadol Itching     Physical Exam:    Vitals  Blood pressure (!) 194/91, pulse (!) 116, resp. rate (!) 21, weight 77.1 kg (170 lb), SpO2 93 %.   1. General  lying in bed in NAD,    2. Normal affect and insight, Not Suicidal or Homicidal, Awake Alert, Oriented X 3.  3. No F.N deficits, ALL C.Nerves Intact, Strength 5/5 all 4 extremities, Sensation intact all 4 extremities, Plantars down going.  4. Ears and Eyes appear Normal, Conjunctivae clear, PERRLA. Moist Oral Mucosa.  5. Supple Neck, No JVD, No cervical lymphadenopathy appriciated, No Carotid Bruits.  6. Symmetrical Chest wall movement, Good air movement bilaterally, CTAB.  7.tachy s1, s2, no m/g/r  8. Positive Bowel Sounds, Abdomen Soft, No tenderness, No organomegaly appriciated,No rebound -guarding or rigidity.  9.  No Cyanosis, Normal Skin Turgor, No Skin Rash or Bruise.  10. Good muscle tone,  joints appear normal , no effusions, Normal ROM.  11. No Palpable Lymph Nodes in Neck or Axillae  LUE AVR Slight bleeding, slight swelling  Good radial and ulnar pulse, no swelling of hand  Data Review:    CBC Recent Labs  Lab 05/26/17 0852 05/29/17 1944  WBC  --  13.4*  HGB 10.5* 10.3*  HCT 31.0* 30.5*  PLT  --  170  MCV  --  84.5  MCH  --  28.5  MCHC  --  33.8  RDW  --  18.0*  LYMPHSABS  --  0.7  MONOABS  --  0.2  EOSABS  --  0.0  BASOSABS  --  0.0   ------------------------------------------------------------------------------------------------------------------  Chemistries  Recent Labs  Lab 05/26/17 0852 05/29/17 1944  NA 129* 130*  K 5.3* 5.8*  CL 89* 89*  CO2  --  26  GLUCOSE 116* 353*  BUN 27* 24*  CREATININE 5.40* 5.51*  CALCIUM  --  10.3  AST  --  64*  ALT  --  43  ALKPHOS  --  152*  BILITOT  --  1.2   ------------------------------------------------------------------------------------------------------------------ estimated creatinine clearance is 15.3 mL/min (A) (by C-G formula based on SCr of 5.51 mg/dL  (H)). ------------------------------------------------------------------------------------------------------------------ No results for input(s): TSH, T4TOTAL, T3FREE, THYROIDAB in the last 72 hours.  Invalid input(s): FREET3  Coagulation profile No results for input(s): INR, PROTIME in the last 168 hours. ------------------------------------------------------------------------------------------------------------------- No results for input(s): DDIMER in the last 72 hours. -------------------------------------------------------------------------------------------------------------------  Cardiac Enzymes No results for input(s): CKMB, TROPONINI, MYOGLOBIN in the last 168 hours.  Invalid input(s): CK ------------------------------------------------------------------------------------------------------------------    Component Value Date/Time   BNP 1,062.0 (H) 03/24/2017 2050     ---------------------------------------------------------------------------------------------------------------  Urinalysis    Component Value Date/Time   COLORURINE YELLOW 07/16/2011 1619   APPEARANCEUR CLEAR 07/16/2011 1619   LABSPEC 1.018 07/16/2011 1619   PHURINE 7.5 07/16/2011 1619   GLUCOSEU 250 (A) 07/16/2011 1619   HGBUR MODERATE (A) 07/16/2011 1619   BILIRUBINUR SMALL (A) 07/16/2011 1619   KETONESUR NEGATIVE 07/16/2011 1619   PROTEINUR >300 (A) 07/16/2011 1619   UROBILINOGEN 1.0 07/16/2011 1619   NITRITE NEGATIVE 07/16/2011 1619   LEUKOCYTESUR SMALL (A) 07/16/2011 1619    ----------------------------------------------------------------------------------------------------------------   Imaging Results:    Dg Chest Portable 1 View  Result Date: 05/29/2017 CLINICAL DATA:  Shortness of Breath EXAM: PORTABLE CHEST 1 VIEW COMPARISON:  05/22/2017 FINDINGS: Cardiac shadow remains enlarged. Aortic calcifications are again seen. The lungs are well aerated bilaterally with mild interstitial  changes consistent with early edema. No focal infiltrate or sizable effusion is noted. IMPRESSION: Mild interstitial edema. Electronically Signed   By: Inez Catalina M.D.   On: 05/29/2017 20:29      Assessment & Plan:    Principal Problem:   Dyspnea Active Problems:   Chronic hepatitis C without hepatic coma (HCC)   Essential hypertension   COPD (chronic obstructive pulmonary disease) (HCC)   Hyperkalemia   Tachycardia   Dyspnea secondary to CHF Cont bipap Nephrology consult for dialysis  Tachycardia Tele Trop I q6h x3 tsh Cardiac echo  Hyperkalemia Calcium gluconate Sodium bicarbonate Kayexalate Dialysis  LUE swelling Vascular consult, appreciate input     DVT Prophylaxis Heparin -- SCDs  AM Labs Ordered, also please review Full Orders  Family Communication: Admission, patients condition and plan of care including tests being ordered have been discussed with the patient who indicate understanding and agree with the plan and Code Status.  Code Status FULL CODE  Likely DC to  home  Condition GUARDED    Consults called:  Vascular by ED, nephrology by ED  Admission status: inpatient  Time spent in minutes : Advance  Akaash Vandewater M.D on 05/29/2017 at 10:02 PM  Between 7am to 7pm - Pager - [phone]. After 7pm go to [email or web address] - password [facility]  [facility] - Office  [phone]

## 2017-05-29 NOTE — ED Notes (Signed)
Portable xray at bedside.

## 2017-05-29 NOTE — ED Provider Notes (Addendum)
[facility] EMERGENCY DEPARTMENT Provider Note   CSN: [id number] Arrival date & time: 05/29/17  1426     History   Chief Complaint Chief Complaint  Patient presents with  . Respiratory Distress    HPI [name] is a 56 y.o. male.  HPI Reports that he finished dialysis and got home.  He started to get short of breath.  He reports his chest started to get tight.  Patient reports he does smoke and he had had a cigarette.  He reports it felt like his COPD.  EMS gave him a breathing treatment and some Solu-Medrol.  He reports he feels back to normal now and ready to go home.  He reports he was feeling good taking prednisone but then either ran out or finished about 3 days ago and the symptoms have been building back up gradually since then.  Patient reports he had the extra dialysis session today because of the upcoming holiday.  Patient reports he does not have home oxygen but he is "trying to get it". Past Medical History:  Diagnosis Date  . Adenomatous colon polyp    tubular  . Anemia   . Anxiety   . Arthritis    left shoulder  . Atherosclerosis of aorta ([place])   . Cardiomegaly   . Chest pain    DATE UNKNOWN, C/O PERIODICALLY  . Chronic kidney disease    dialysis - M/W/F  . Cocaine abuse ([place])   . COPD exacerbation ([place]) 08/17/2016  . Dialysis patient ([place])    Monday-Wednesday-Friday  . GERD (gastroesophageal reflux disease)    DATE UNKNOWN  . Hemorrhoids   . Hepatitis B, chronic ([place])   . Hepatitis C   . Hyperkalemia   . Hypertension   . Metabolic bone disease    Patient denies  . Nephrolithiasis   . Pneumonia   . Pulmonary edema   . Renal disorder   . Renal insufficiency   . Shortness of breath dyspnea    " for the last past year with this dialysis"  . Tubular adenoma of colon     Patient Active Problem List   Diagnosis Date Noted  . Hyperkalemia 05/22/2017  . Acute metabolic encephalopathy   . Anemia 04/23/2017  . Ascites  04/23/2017  . COPD (chronic obstructive pulmonary disease) ([place]) 04/23/2017  . Acute on chronic respiratory failure with hypoxia ([place]) 03/25/2017  . Atrial flutter ([place]) 03/25/2017  . Arrhythmia 03/25/2017  . COPD GOLD II/ still smoking 09/27/2016  . Essential hypertension 09/27/2016  . Fluid overload 08/30/2016  . COPD exacerbation ([place]) 08/17/2016  . Hypertensive urgency 08/17/2016  . Respiratory failure ([place]) 08/17/2016  . Problem with dialysis access ([place]) 07/23/2016  . End stage renal disease ([place]) 12/02/2015  . Chronic hepatitis B ([place]) 03/05/2014  . Chronic hepatitis C without hepatic coma ([place]) 03/05/2014  . Internal hemorrhoids with bleeding, swelling and itching 03/05/2014  . Thrombocytopenia ([place]) 03/05/2014  . Chest pain 02/27/2014  . Alcohol abuse 04/14/2009  . Cigarette smoker 04/14/2009  . GANGLION CYST 04/14/2009    Past Surgical History:  Procedure Laterality Date  . A/V FISTULAGRAM Left 05/26/2017   Performed by Conrad Durant, MD at [facility]  . AV FISTULA PLACEMENT  2012   BELIEVED WAS PLACED IN JUNE  . COLONOSCOPY    . CORONARY STENT INTERVENTION N/A 02/22/2017   Performed by Nigel Mormon, MD at [facility]  . HEMORRHOID BANDING    .  LEFT HEART CATH AND CORONARY ANGIOGRAPHY N/A 02/22/2017   Performed by Nigel Mormon, MD at [facility]  . PLICATION OF DISTAL ANEURYSMAL SEGEMENT OF LEFT UPPER ARM ARTERIOVENOUS FISTULA Left 01/20/2016   Performed by Elam Dutch, MD at [facility]  . Plication of Left Arm Arteriovenous Fistula Left 12/14/2015   Performed by Elam Dutch, MD at [facility]  . Plication of Left Upper Arm Fistula  Left 07/24/2016   Performed by Waynetta Sandy, MD at [facility]  . POLYPECTOMY         Home Medications    Prior to Admission medications   Medication Sig Start Date End Date Taking? Authorizing Provider  acetaminophen (TYLENOL) 500 MG tablet Take 500-1,000 mg every 6 (six) hours as needed  by mouth for headache (pain).    Yes [provider]  albuterol (PROVENTIL HFA;VENTOLIN HFA) 108 (90 Base) MCG/ACT inhaler Inhale 2 puffs into the lungs every 6 (six) hours as needed for wheezing or shortness of breath. 03/10/17  Yes Javier Glazier, MD  aspirin EC 81 MG tablet Take 81 mg by mouth daily.   Yes [provider]  budesonide-formoterol (SYMBICORT) 160-4.5 MCG/ACT inhaler Inhale 2 puffs into the lungs 2 (two) times daily.   Yes [provider]  clopidogrel (PLAVIX) 75 MG tablet Take one tablet (75mg ) daily. 04/07/17  Yes [provider]  gabapentin (NEURONTIN) 100 MG capsule Take 100 mg 2 (two) times daily by mouth.   Yes [provider]  minoxidil (LONITEN) 10 MG tablet Take 10 mg 2 (two) times daily as needed by mouth (when blood pressure cuff reads "above normal").  05/02/17  Yes [provider]  nicotine (NICODERM CQ - DOSED IN MG/24 HOURS) 14 mg/24hr patch Place 14 mg daily onto the skin. 03/30/17  Yes [provider]  ondansetron (ZOFRAN) 4 MG tablet Take 4 mg every 8 (eight) hours as needed by mouth for nausea or vomiting.  04/27/17  Yes [provider]  predniSONE (DELTASONE) 20 MG tablet Take 2 tablets (40 mg total) daily by mouth. 05/22/17  Yes Plunkett, Loree Fee, MD  sevelamer carbonate (RENVELA) 800 MG tablet Take 3 tablets (2,400 mg total) by mouth 3 (three) times daily with meals. Patient taking differently: Take 3,200 mg 3 (three) times daily with meals by mouth.  03/26/17  Yes Cherene Altes, MD  calcitRIOL (ROCALTROL) 0.25 MCG capsule Take 3 capsules (0.75 mcg total) by mouth every Monday, Wednesday, and Friday with hemodialysis. 03/28/17   Cherene Altes, MD  predniSONE (DELTASONE) 20 MG tablet 3 tabs po daily x 3 days, then 2 tabs x 3 days, then 1.5 tabs x 3 days, then 1 tab x 3 days, then 0.5 tabs x 3 days 05/29/17   Charlesetta Shanks, MD  Spacer/Aero-Holding Chambers (AEROCHAMBER MV) inhaler Use  as instructed Patient not taking: Reported on 05/29/2017 03/10/17   Javier Glazier, MD    Family History Family History  Problem Relation Age of Onset  . Heart disease Mother   . Lung cancer Mother   . Heart disease Father   . Malignant hyperthermia Father   . COPD Father   . Throat cancer Sister   . Esophageal cancer Sister   . Hypertension Other   . COPD Other   . Colon cancer Neg Hx   . Colon polyps Neg Hx   . Rectal cancer Neg Hx   . Stomach cancer Neg Hx     Social  History Social History   Tobacco Use  . Smoking status: Current Every Day Smoker    Packs/day: 0.50    Years: 43.00    Pack years: 21.50    Types: Cigarettes    Start date: 08/13/1973  . Smokeless tobacco: Never Used  . Tobacco comment: Less than 1/2 pk per day  Substance Use Topics  . Alcohol use: Yes    Alcohol/week: 2.4 oz    Types: 1 Glasses of wine, 1 Cans of beer, 1 Shots of liquor, 1 Standard drinks or equivalent per week    Comment: occasional   . Drug use: Yes    Types: Marijuana, Cocaine     Allergies   Aspirin; Clonidine derivatives; and Tramadol   Review of Systems Review of Systems 10 Systems reviewed and are negative for acute change except as noted in the HPI.   Physical Exam Updated Vital Signs BP 139/63   Pulse (!) 118   Temp 98.4 F (36.9 C) (Oral)   Resp 18   Ht 5\' 9"  (1.753 m)   Wt 77.1 kg (170 lb)   SpO2 99%   BMI 25.10 kg/m   Physical Exam  Constitutional: He is oriented to person, place, and time. He appears well-developed and well-nourished. No distress.  Patient is alert and nontoxic.  HENT:  Head: Normocephalic and atraumatic.  Mouth/Throat: Oropharynx is clear and moist.  Eyes: Conjunctivae and EOM are normal.  Neck: Neck supple.  Cardiovascular:  No murmur heard. Mild tachycardia.  Regular.  2\6 systolic ejection murmur.  Pulmonary/Chest: Effort normal and breath sounds normal. No respiratory distress.  Diffuse expiratory wheeze.  Fair airflow  to the bases.  No respiratory distress at rest.  After completing patient's evaluation, he insisted on getting up to go to the bathroom and ambulated out of the room and down the hall without difficulty.  Abdominal: Soft. He exhibits distension. There is no tenderness. There is no guarding.  Patient's abdomen is mildly distended suggestive of ascites.  It is nontender without guarding.  No erythema.  Not severely tense or firm.  Musculoskeletal: Normal range of motion. He exhibits no edema.  Patient has extensive bulging fistulas of the left upper extremity from his dialysis access sites.  Trace pitting edema bilateral lower extremities.  Neurological: He is alert and oriented to person, place, and time. No cranial nerve deficit. He exhibits normal muscle tone. Coordination normal.  Skin: Skin is warm and dry.  Psychiatric: He has a normal mood and affect.  Nursing note and vitals reviewed.    ED Treatments / Results  Labs (all labs ordered are listed, but only abnormal results are displayed) Labs Reviewed - No data to display  EKG  EKG Interpretation None       Radiology No results found.  Procedures Procedures (including critical care time)  Medications Ordered in ED Medications - No data to display   Initial Impression / Assessment and Plan / ED Course  I have reviewed the triage vital signs and the nursing notes.  Pertinent labs & imaging results that were available during my care of the patient were reviewed by me and considered in my medical decision making (see chart for details).      Final Clinical Impressions(s) / ED Diagnoses   Final diagnoses:  COPD exacerbation ([place])  Severe comorbid illness  Needs smoking cessation education  Patient is rechecked 15: 29.  He is well in appearance.  No distress.  Alert and interactive.  He reports he  feels fine now.  He denies wanting to try another nebulizer treatment.  He feels ready to go home.  He reports he does want  to explore additional information on how to quit smoking.  He reports he is tried and is been very difficult.  No resources in the resource guide are provided.  He is encouraged to discuss best treatment for him with his family doctor.  Patient reports he did really well when he was taking the prednisone and when he finished it is when he started getting the shortness of breath back again.  Will provide a longer taper and patient is advised he must follow-up with his doctor within the next week for reassessment.  ED Discharge Orders        Ordered    predniSONE (DELTASONE) 20 MG tablet     05/29/17 1549       Charlesetta Shanks, MD 05/29/17 1554    Charlesetta Shanks, MD 05/29/17 1556

## 2017-05-29 NOTE — Discharge Instructions (Signed)
1.  Multiple information sets are in your discharge instructions for smoking cessation.  Discussed with your primary care doctor what might be your best approach. 2.  Start prednisone as prescribed.  See your doctor within 1 week.  Use your inhaler every 4-6 hours as needed. 3.  Return immediately if your breathing is getting difficult or other concerning symptoms develop.

## 2017-05-30 ENCOUNTER — Inpatient Hospital Stay (HOSPITAL_COMMUNITY): Payer: Medicare Other

## 2017-05-30 ENCOUNTER — Inpatient Hospital Stay (HOSPITAL_COMMUNITY): Payer: Medicare Other | Admitting: Certified Registered Nurse Anesthetist

## 2017-05-30 ENCOUNTER — Encounter (HOSPITAL_COMMUNITY): Payer: Self-pay | Admitting: Anesthesiology

## 2017-05-30 ENCOUNTER — Encounter (HOSPITAL_COMMUNITY)
Admission: EM | Disposition: A | Discharge status: Other Acute Inpatient Hospital | Payer: Self-pay | Source: Home / Self Care | Attending: Internal Medicine

## 2017-05-30 DIAGNOSIS — N186 End stage renal disease: Secondary | ICD-10-CM

## 2017-05-30 DIAGNOSIS — Z992 Dependence on renal dialysis: Secondary | ICD-10-CM

## 2017-05-30 DIAGNOSIS — T82590D Other mechanical complication of surgically created arteriovenous fistula, subsequent encounter: Secondary | ICD-10-CM

## 2017-05-30 DIAGNOSIS — T82898A Other specified complication of vascular prosthetic devices, implants and grafts, initial encounter: Secondary | ICD-10-CM

## 2017-05-30 HISTORY — PX: INSERTION OF DIALYSIS CATHETER: SHX1324

## 2017-05-30 LAB — BASIC METABOLIC PANEL
Anion gap: 13 (ref 5–15)
Anion gap: 12 (ref 5–15)
BUN: 32 mg/dL — ABNORMAL HIGH (ref 6–20)
BUN: 36 mg/dL — ABNORMAL HIGH (ref 6–20)
CO2: 27 mmol/L (ref 22–32)
CO2: 30 mmol/L (ref 22–32)
Calcium: 10.6 mg/dL — ABNORMAL HIGH (ref 8.9–10.3)
Calcium: 10.7 mg/dL — ABNORMAL HIGH (ref 8.9–10.3)
Chloride: 90 mmol/L — ABNORMAL LOW (ref 101–111)
Chloride: 90 mmol/L — ABNORMAL LOW (ref 101–111)
Creatinine, Ser: 6.03 mg/dL — ABNORMAL HIGH (ref 0.61–1.24)
Creatinine, Ser: 6.49 mg/dL — ABNORMAL HIGH (ref 0.61–1.24)
GFR calc Af Amer: 11 mL/min — ABNORMAL LOW (ref >60)
GFR calc Af Amer: 10 mL/min — ABNORMAL LOW (ref >60)
GFR calc non Af Amer: 10 mL/min — ABNORMAL LOW (ref >60)
GFR calc non Af Amer: 9 mL/min — ABNORMAL LOW (ref >60)
Glucose, Bld: 151 mg/dL — ABNORMAL HIGH (ref 65–99)
Glucose, Bld: 106 mg/dL — ABNORMAL HIGH (ref 65–99)
Potassium: 7 mmol/L (ref 3.5–5.1)
Potassium: 6.2 mmol/L — ABNORMAL HIGH (ref 3.5–5.1)
Sodium: 130 mmol/L — ABNORMAL LOW (ref 135–145)
Sodium: 132 mmol/L — ABNORMAL LOW (ref 135–145)

## 2017-05-30 SURGERY — INSERTION OF DIALYSIS CATHETER
Anesthesia: Monitor Anesthesia Care

## 2017-05-30 MED ORDER — ONDANSETRON HCL 4 MG/2ML IJ SOLN
INTRAMUSCULAR | Status: DC | PRN
  Administered 2017-05-30: 4 mg via INTRAVENOUS

## 2017-05-30 MED ORDER — CEFAZOLIN SODIUM-DEXTROSE 2-4 GM/100ML-% IV SOLN: 2.0000 g | INTRAVENOUS | Status: DC

## 2017-05-30 MED ORDER — FENTANYL CITRATE (PF) 100 MCG/2ML IJ SOLN
12.5000 ug | INTRAMUSCULAR | Status: DC | PRN
  Administered 2017-05-30 – 2017-06-02 (×12): 12.5 ug via INTRAVENOUS
  Filled 2017-05-30 (×12): qty 2

## 2017-05-30 MED ORDER — SODIUM CHLORIDE 0.9 % IV SOLN: 100.0000 mL | INTRAVENOUS | Status: DC | PRN

## 2017-05-30 MED ORDER — SODIUM CHLORIDE 0.9 % IV SOLN
Freq: Once | INTRAVENOUS | Status: AC
  Administered 2017-05-30: 15:00:00 via INTRAVENOUS

## 2017-05-30 MED ORDER — HEPARIN SODIUM (PORCINE) 1000 UNIT/ML DIALYSIS
1000.0000 [IU] | INTRAMUSCULAR | Status: DC | PRN
  Filled 2017-05-30: qty 1

## 2017-05-30 MED ORDER — SODIUM POLYSTYRENE SULFONATE 15 GM/60ML PO SUSP
30.0000 g | Freq: Once | ORAL | Status: AC
  Administered 2017-05-30: 30 g via ORAL
  Filled 2017-05-30: qty 120

## 2017-05-30 MED ORDER — INSULIN ASPART 100 UNIT/ML IV SOLN
10.0000 [IU] | Freq: Once | INTRAVENOUS | Status: AC
  Administered 2017-05-30: 10 [IU] via INTRAVENOUS
  Filled 2017-05-30: qty 0.1

## 2017-05-30 MED ORDER — LIDOCAINE HCL (PF) 1 % IJ SOLN
INTRAMUSCULAR | Status: AC
  Filled 2017-05-30: qty 30

## 2017-05-30 MED ORDER — PROPOFOL 500 MG/50ML IV EMUL
INTRAVENOUS | Status: DC | PRN
  Administered 2017-05-30: 15 ug/kg/min via INTRAVENOUS
  Administered 2017-05-30: 25 ug/kg/min via INTRAVENOUS

## 2017-05-30 MED ORDER — SODIUM BICARBONATE 8.4 % IV SOLN
50.0000 meq | Freq: Once | INTRAVENOUS | Status: AC
  Administered 2017-05-30: 50 meq via INTRAVENOUS
  Filled 2017-05-30 (×2): qty 50

## 2017-05-30 MED ORDER — SODIUM CHLORIDE 0.9 % IV SOLN
1.0000 g | Freq: Once | INTRAVENOUS | Status: AC
  Administered 2017-05-30: 1 g via INTRAVENOUS
  Filled 2017-05-30: qty 10

## 2017-05-30 MED ORDER — HEPARIN SODIUM (PORCINE) 1000 UNIT/ML IJ SOLN
INTRAMUSCULAR | Status: AC
  Filled 2017-05-30: qty 1

## 2017-05-30 MED ORDER — LIDOCAINE HCL 1 % IJ SOLN
INTRAMUSCULAR | Status: AC
  Filled 2017-05-30: qty 20

## 2017-05-30 MED ORDER — ONDANSETRON HCL 4 MG/2ML IJ SOLN
INTRAMUSCULAR | Status: AC
  Filled 2017-05-30: qty 2

## 2017-05-30 MED ORDER — MEPERIDINE HCL 25 MG/ML IJ SOLN: 6.2500 mg | INTRAMUSCULAR | Status: DC | PRN

## 2017-05-30 MED ORDER — DEXTROSE 5 % IV SOLN
INTRAVENOUS | Status: AC
  Filled 2017-05-30: qty 1.5

## 2017-05-30 MED ORDER — FENTANYL CITRATE (PF) 250 MCG/5ML IJ SOLN
INTRAMUSCULAR | Status: AC
  Filled 2017-05-30: qty 5

## 2017-05-30 MED ORDER — PROMETHAZINE HCL 25 MG/ML IJ SOLN: 6.2500 mg | INTRAMUSCULAR | Status: DC | PRN

## 2017-05-30 MED ORDER — LIDOCAINE HCL (PF) 1 % IJ SOLN
INTRAMUSCULAR | Status: DC | PRN
  Administered 2017-05-30: 13 mL

## 2017-05-30 MED ORDER — ALTEPLASE 2 MG IJ SOLR: 2.0000 mg | Freq: Once | INTRAMUSCULAR | Status: DC | PRN

## 2017-05-30 MED ORDER — MIDAZOLAM HCL 2 MG/2ML IJ SOLN
INTRAMUSCULAR | Status: DC | PRN
  Administered 2017-05-30 (×2): 0.5 mg via INTRAVENOUS

## 2017-05-30 MED ORDER — LIDOCAINE-PRILOCAINE 2.5-2.5 % EX CREA
1.0000 "application " | TOPICAL_CREAM | CUTANEOUS | Status: DC | PRN
  Filled 2017-05-30: qty 5

## 2017-05-30 MED ORDER — ACETAMINOPHEN 325 MG PO TABS: 650.0000 mg | ORAL_TABLET | Freq: Four times a day (QID) | ORAL | Status: DC | PRN

## 2017-05-30 MED ORDER — FENTANYL CITRATE (PF) 250 MCG/5ML IJ SOLN
INTRAMUSCULAR | Status: DC | PRN
  Administered 2017-05-30 (×2): 25 ug via INTRAVENOUS

## 2017-05-30 MED ORDER — DEXTROSE 5 % IV SOLN
1.5000 g | INTRAVENOUS | Status: AC
  Administered 2017-05-30: 1.5 g via INTRAVENOUS
  Filled 2017-05-30: qty 1.5

## 2017-05-30 MED ORDER — SODIUM CHLORIDE 0.9 % IV SOLN
INTRAVENOUS | Status: DC | PRN
  Administered 2017-05-30: 500 mL

## 2017-05-30 MED ORDER — DEXAMETHASONE SODIUM PHOSPHATE 10 MG/ML IJ SOLN
INTRAMUSCULAR | Status: AC
  Filled 2017-05-30: qty 1

## 2017-05-30 MED ORDER — LIDOCAINE HCL (PF) 1 % IJ SOLN: 5.0000 mL | INTRAMUSCULAR | Status: DC | PRN

## 2017-05-30 MED ORDER — FENTANYL CITRATE (PF) 100 MCG/2ML IJ SOLN: 25.0000 ug | INTRAMUSCULAR | Status: DC | PRN

## 2017-05-30 MED ORDER — HEPARIN SODIUM (PORCINE) 1000 UNIT/ML DIALYSIS
4000.0000 [IU] | Freq: Once | INTRAMUSCULAR | Status: DC
  Filled 2017-05-30: qty 4

## 2017-05-30 MED ORDER — HEPARIN SODIUM (PORCINE) 1000 UNIT/ML IJ SOLN
INTRAMUSCULAR | Status: DC | PRN
  Administered 2017-05-30: 3400 [IU] via INTRAVENOUS

## 2017-05-30 MED ORDER — TRAMADOL HCL 50 MG PO TABS: 50.0000 mg | ORAL_TABLET | Freq: Four times a day (QID) | ORAL | Status: DC | PRN

## 2017-05-30 MED ORDER — PENTAFLUOROPROP-TETRAFLUOROETH EX AERO: 1.0000 "application " | INHALATION_SPRAY | CUTANEOUS | Status: DC | PRN

## 2017-05-30 MED ORDER — INSULIN ASPART 100 UNIT/ML ~~LOC~~ SOLN
SUBCUTANEOUS | Status: AC
  Filled 2017-05-30: qty 1

## 2017-05-30 MED ORDER — LACTATED RINGERS IV SOLN: INTRAVENOUS | Status: DC

## 2017-05-30 MED ORDER — GABAPENTIN 100 MG PO CAPS
100.0000 mg | ORAL_CAPSULE | Freq: Two times a day (BID) | ORAL | Status: DC
  Administered 2017-05-31 – 2017-06-09 (×16): 100 mg via ORAL
  Filled 2017-05-30 (×18): qty 1

## 2017-05-30 MED ORDER — SODIUM POLYSTYRENE SULFONATE 15 GM/60ML PO SUSP
15.0000 g | Freq: Once | ORAL | Status: AC
  Administered 2017-05-30: 15 g via ORAL
  Filled 2017-05-30 (×2): qty 60

## 2017-05-30 MED ORDER — DEXTROSE 50 % IV SOLN
1.0000 | Freq: Once | INTRAVENOUS | Status: AC
  Administered 2017-05-30: 50 mL via INTRAVENOUS
  Filled 2017-05-30: qty 50

## 2017-05-30 MED ORDER — MIDAZOLAM HCL 2 MG/2ML IJ SOLN
INTRAMUSCULAR | Status: AC
  Filled 2017-05-30: qty 2

## 2017-05-30 SURGICAL SUPPLY — 45 items
ADH SKN CLS APL DERMABOND .7 (GAUZE/BANDAGES/DRESSINGS) ×1
BAG DECANTER FOR FLEXI CONT (MISCELLANEOUS) ×2 IMPLANT
BANDAGE ACE 4X5 VEL STRL LF (GAUZE/BANDAGES/DRESSINGS) ×1 IMPLANT
BIOPATCH RED 1 DISK 7.0 (GAUZE/BANDAGES/DRESSINGS) ×2 IMPLANT
BNDG GAUZE ELAST 4 BULKY (GAUZE/BANDAGES/DRESSINGS) ×1 IMPLANT
CATH PALINDROME RT-P 15FX19CM (CATHETERS) IMPLANT
CATH PALINDROME RT-P 15FX23CM (CATHETERS) ×1 IMPLANT
CATH PALINDROME RT-P 15FX28CM (CATHETERS) IMPLANT
CATH PALINDROME RT-P 15FX55CM (CATHETERS) IMPLANT
CATH STRAIGHT 5FR 65CM (CATHETERS) IMPLANT
CHLORAPREP W/TINT 26ML (MISCELLANEOUS) ×2 IMPLANT
COVER PROBE W GEL 5X96 (DRAPES) ×1 IMPLANT
COVER SURGICAL LIGHT HANDLE (MISCELLANEOUS) ×2 IMPLANT
DECANTER SPIKE VIAL GLASS SM (MISCELLANEOUS) IMPLANT
DERMABOND ADVANCED (GAUZE/BANDAGES/DRESSINGS) ×1
DERMABOND ADVANCED .7 DNX12 (GAUZE/BANDAGES/DRESSINGS) IMPLANT
DRAPE C-ARM 42X72 X-RAY (DRAPES) ×2 IMPLANT
DRAPE CHEST BREAST 15X10 FENES (DRAPES) ×2 IMPLANT
GAUZE SPONGE 4X4 16PLY XRAY LF (GAUZE/BANDAGES/DRESSINGS) ×2 IMPLANT
GAUZE XEROFORM 5X9 LF (GAUZE/BANDAGES/DRESSINGS) ×1 IMPLANT
GLOVE BIO SURGEON STRL SZ7.5 (GLOVE) ×2 IMPLANT
GLOVE BIOGEL PI IND STRL 6 (GLOVE) IMPLANT
GLOVE BIOGEL PI INDICATOR 6 (GLOVE) ×1
GOWN STRL REUS W/ TWL LRG LVL3 (GOWN DISPOSABLE) ×2 IMPLANT
GOWN STRL REUS W/TWL LRG LVL3 (GOWN DISPOSABLE) ×4
KIT BASIN OR (CUSTOM PROCEDURE TRAY) ×2 IMPLANT
KIT ROOM TURNOVER OR (KITS) ×2 IMPLANT
NDL 18GX1X1/2 (RX/OR ONLY) (NEEDLE) ×1 IMPLANT
NDL HYPO 25GX1X1/2 BEV (NEEDLE) ×1 IMPLANT
NEEDLE 18GX1X1/2 (RX/OR ONLY) (NEEDLE) ×2 IMPLANT
NEEDLE HYPO 25GX1X1/2 BEV (NEEDLE) ×2 IMPLANT
NS IRRIG 1000ML POUR BTL (IV SOLUTION) ×2 IMPLANT
PACK SURGICAL SETUP 50X90 (CUSTOM PROCEDURE TRAY) ×2 IMPLANT
PAD ARMBOARD 7.5X6 YLW CONV (MISCELLANEOUS) ×4 IMPLANT
SET MICROPUNCTURE 5F STIFF (MISCELLANEOUS) IMPLANT
SUT ETHILON 3 0 PS 1 (SUTURE) ×2 IMPLANT
SUT VICRYL 4-0 PS2 18IN ABS (SUTURE) ×2 IMPLANT
SYR 10ML LL (SYRINGE) ×2 IMPLANT
SYR 20CC LL (SYRINGE) ×4 IMPLANT
SYR 5ML LL (SYRINGE) ×2 IMPLANT
SYR CONTROL 10ML LL (SYRINGE) ×2 IMPLANT
TOWEL GREEN STERILE (TOWEL DISPOSABLE) ×2 IMPLANT
TOWEL GREEN STERILE FF (TOWEL DISPOSABLE) ×2 IMPLANT
WATER STERILE IRR 1000ML POUR (IV SOLUTION) ×2 IMPLANT
WIRE AMPLATZ SS-J .035X180CM (WIRE) IMPLANT

## 2017-05-30 NOTE — ED Notes (Signed)
Pt called out that now hes ready to take the meds offered earlier

## 2017-05-30 NOTE — ED Notes (Signed)
Bandaged lt upper arm with blisters.  No idea why he is in back in the ed

## 2017-05-30 NOTE — Progress Notes (Signed)
[facility] Progress Note   Subjective: Seen in ED. Was going to IR this morning for temporary dialysis catheter placement, however, patient had eaten and refused to have procedure unless he had sedation. Has been rescheduled for perm cath placement. He says "they messed up my arm yesterday at that dialysis center. C/O pain and swelling in LUA AVF. Also having mild WOB. C/O of SOB but says "they won't give me any oxygen. " O2 sats 97% on RA.   Objective Vitals:   05/30/17 1015 05/30/17 1125 05/30/17 1130 05/30/17 1145  BP: (!) 135/56 (!) 148/85 (!) 143/66 (!) 147/68  Pulse: 99 (!) 115 (!) 107 (!) 104  Resp: 12 (!) 25 13 17   SpO2: 100% 100% 100% 100%  Weight:       Physical Exam General: Awake, alert, NAD Heart: S1,S2 RRR Tachy-HR 99-115 Lungs: bilateral breath sounds, slightly decreased in bases, sounds tight with coarse breath sounds Abdomen: active BS SNT Extremities: trace pre-tib edema Dialysis Access: LUA AVF: edematous with blistering on inner aspect of AVF + bruit. AVF wrapped in cling.    Additional Objective Labs: Basic Metabolic Panel: Recent Labs  Lab 05/26/17 0852 05/29/17 1944 05/30/17 0843  NA 129* 130* 130*  K 5.3* 5.8* 7.0*  CL 89* 89* 90*  CO2  --  26 27  GLUCOSE 116* 353* 151*  BUN 27* 24* 32*  CREATININE 5.40* 5.51* 6.03*  CALCIUM  --  10.3 10.6*   Liver Function Tests: Recent Labs  Lab 05/29/17 1944  AST 64*  ALT 43  ALKPHOS 152*  BILITOT 1.2  PROT 8.3*  ALBUMIN 4.2   CBC: Recent Labs  Lab 05/26/17 0852 05/29/17 1944  WBC  --  13.4*  NEUTROABS  --  12.5*  HGB 10.5* 10.3*  HCT 31.0* 30.5*  MCV  --  84.5  PLT  --  170   Blood Culture    Component Value Date/Time   SDES BLOOD RIGHT FOREARM 02/20/2017 1715   SPECREQUEST  02/20/2017 1715    BOTTLES DRAWN AEROBIC AND ANAEROBIC Blood Culture adequate volume   CULT NO GROWTH 5 DAYS 02/20/2017 1715   REPTSTATUS 02/25/2017 FINAL 02/20/2017 1715    Studies/Results: Dg  Chest Portable 1 View  Result Date: 05/29/2017 CLINICAL DATA:  Shortness of Breath EXAM: PORTABLE CHEST 1 VIEW COMPARISON:  05/22/2017 FINDINGS: Cardiac shadow remains enlarged. Aortic calcifications are again seen. The lungs are well aerated bilaterally with mild interstitial changes consistent with early edema. No focal infiltrate or sizable effusion is noted. IMPRESSION: Mild interstitial edema. Electronically Signed   By: Inez Catalina M.D.   On: 05/29/2017 20:29   Medications: . sodium chloride    . sodium chloride    .  ceFAZolin (ANCEF) IV     . heparin  3,000 Units Dialysis Once in dialysis     Dialysis: [facility]h   72.5kg  2/2  Hep 3000  LUA AVF -mircera 200 ug next 11/29 -venofer load 7 doses left 100 mg -sensipar 120 mg tiw w/ HD -left over edw last 4 HD (76 - 81kg post) -s/o early about 50% of session -Hb 9.5, tsat 20%, Ca 10.7, P 4, pth 1475   Impression: 1. Acute dyspnea - pulm edema on CXR, vol overload on exam. BP's high. Was supposed to have HD last PM however had issues with AVF D/T high pressures. Was scheduled for T-cath however patient refused unless he could have sedation. How holding in ED for perm cath placement  per IR. Still with mild WOB, pre tib edema. HD as soon as perm cath placement.  2. LUA AVF edema/blistering:  LUA AVF: edematous with blistering on inner aspect of AVF + bruit.Recent fistulogram per Dr. Bridgett Larsson (05/26/17)-AVF is patent without evidence of central stenosis. Will need VVS to see patient again ?  I guess just a bad infiltration, will need to rest for a while and use PC 3. Hyperkalemia - K+7.0 this AM. Rec'd kayexalate at 0655 and 1045. Repeat K+ now.  4. ESRD on HD. HD as soon as perm cath placed.  5. Hepatitis B + - HD in isolation  6. Hx hep C 7. Hx substance abuse   Rita H. Brown NP-C 05/30/2017, 12:57 PM  [facility] [phone]  Patient seen and examined, agree with above note with above modifications. Pt known  to me is noncompliant with HD- always signs off after 2 hours.  It seems last HD was ?Friday ?- On Sunday he reports arm was infiltrated, came here, unable to use AVF last night, now needs catheter.  By the looks of his arm - he will need to use a PC for a while as it heals, very edematous right now- with negative fistulogram on 11/15.  Barriers to getting permcath- really hope to get soon so can get to HD.  Will likely need another treatment tomorrow  Corliss Parish, MD 05/30/2017

## 2017-05-30 NOTE — Anesthesia Postprocedure Evaluation (Signed)
Anesthesia Post Note  Patient: [name]  Procedure(s) Performed: INSERTION OF DIALYSIS CATHETER (N/A )     Patient location during evaluation: PACU Anesthesia Type: MAC Level of consciousness: awake and alert Pain management: pain level controlled Vital Signs Assessment: post-procedure vital signs reviewed and stable Respiratory status: spontaneous breathing, nonlabored ventilation, respiratory function stable and patient connected to nasal cannula oxygen Cardiovascular status: stable and blood pressure returned to baseline Postop Assessment: no apparent nausea or vomiting Anesthetic complications: no    Last Vitals:  Vitals:   05/30/17 1758 05/30/17 1800  BP:  121/71  Pulse:  (!) 103  Resp:  18  Temp: 36.7 C   SpO2:  96%    Last Pain:  Vitals:   05/30/17 1758  PainSc: 0-No pain                 Effie Berkshire

## 2017-05-30 NOTE — Anesthesia Preprocedure Evaluation (Signed)
Anesthesia Evaluation    Reviewed: Allergy & Precautions, Patient's Chart, lab work & pertinent test results  Airway Mallampati: II  TM Distance: >3 FB Neck ROM: Full    Dental  (+) Teeth Intact, Dental Advisory Given   Pulmonary COPD, Current Smoker,    breath sounds clear to auscultation       Cardiovascular hypertension, + Peripheral Vascular Disease   Rhythm:Regular Rate:Normal     Neuro/Psych PSYCHIATRIC DISORDERS Anxiety negative neurological ROS     GI/Hepatic GERD  ,(+) Hepatitis -, B, C  Endo/Other  negative endocrine ROS  Renal/GU ESRF and DialysisRenal disease     Musculoskeletal  (+) Arthritis ,   Abdominal   Peds  Hematology negative hematology ROS (+)   Anesthesia Other Findings Day of surgery medications reviewed with the patient.  Reproductive/Obstetrics                             Lab Results  Component Value Date   WBC 13.4 (H) 05/29/2017   HGB 10.3 (L) 05/29/2017   HCT 30.5 (L) 05/29/2017   MCV 84.5 05/29/2017   PLT 170 05/29/2017   Lab Results  Component Value Date   CREATININE 6.49 (H) 05/30/2017   BUN 36 (H) 05/30/2017   NA 132 (L) 05/30/2017   K 6.2 (H) 05/30/2017   CL 90 (L) 05/30/2017   CO2 30 05/30/2017     Anesthesia Physical Anesthesia Plan  ASA: III  Anesthesia Plan: MAC   Post-op Pain Management:    Induction: Intravenous  PONV Risk Score and Plan: 1 and Ondansetron and Propofol infusion  Airway Management Planned: Natural Airway and Nasal Cannula  Additional Equipment:   Intra-op Plan:   Post-operative Plan:   Informed Consent: I have reviewed the patients History and Physical, chart, labs and discussed the procedure including the risks, benefits and alternatives for the proposed anesthesia with the patient or authorized representative who has indicated his/her understanding and acceptance.   Dental advisory given  Plan Discussed  with: CRNA  Anesthesia Plan Comments:         Anesthesia Quick Evaluation

## 2017-05-30 NOTE — ED Notes (Signed)
The pt was sent down here from dialysis  No beds in the hospital  The pt had to \\use  the br as soon as he arrived in the ed un-cooperative. Demanding pain med

## 2017-05-30 NOTE — ED Notes (Addendum)
Admitting physician called due to [place] 7.0

## 2017-05-30 NOTE — Progress Notes (Signed)
   05/30/17 1044  Clinical Encounter Type  Visited With Patient  Visit Type Initial  Spiritual Encounters  Spiritual Needs Prayer   Patient saw me in the hall and asked for a visit.  Believe her really just wanted someone to talk to. Provided prayer and some comforting words that seemed to help the patient find some sense of peace. Will follow as needed. Chaplain Katherene Ponto

## 2017-05-30 NOTE — ED Notes (Signed)
Pt continues to be disrespectful and belittling with every attempt to help patient and doing every request to help make him more comfortable. Pt states I do not want you in here any more. I told patient I would step out and give him a moment if he would like another RN we can provide that for him.

## 2017-05-30 NOTE — ED Notes (Signed)
[place] MD called to check on this PT. States pt should be kept NPO after breakfast tray. Unsure what time procedure will be at this time.

## 2017-05-30 NOTE — Transfer of Care (Signed)
Immediate Anesthesia Transfer of Care Note  Patient: [name]  Procedure(s) Performed: INSERTION OF DIALYSIS CATHETER (N/A )  Patient Location: PACU  Anesthesia Type:MAC  Level of Consciousness: awake, alert  and oriented  Airway & Oxygen Therapy: Patient Spontanous Breathing  Post-op Assessment: Report given to RN and Post -op Vital signs reviewed and stable  Post vital signs: Reviewed and stable  Last Vitals:  Vitals:   05/30/17 1758 05/30/17 1800  BP:    Pulse:  (!) 103  Resp:  18  Temp: 36.7 C   SpO2:  96%    Last Pain:  Vitals:   05/30/17 1258  PainSc: 10-Worst pain ever         Complications: No apparent anesthesia complications

## 2017-05-30 NOTE — Progress Notes (Signed)
[facility] TEAM 1 - Stepdown/ICU TEAM  [name]  CZY:[id number] DOB: Jun 03, 1961 DOA: 05/29/2017 PCP: [name], MD    Brief Narrative:  56 y.o. male w/ hx HTN, coccaine abuse, Hep B/C, COPD, Systolic CHF (EF 09-32%),  and ESRD on HD (M, W, F) who presented to the ED w/ c/o dyspnea as well as sudden swelling in his left upper extremity.   He had a L brachiocephalic AVF shuntogram on 05/26/2017 which noted a patent fistula with >34mm small pseudoaneurysm proximally and moderate bilobe pseudoaneurysm in the mid segment and moderate sized broad pseudoaneurysm distally.   Significant Events: 11/18 admit to [facility]   Subjective: Multiple attempts made to visit patient today.  He has been seen by both Nephrolgoy as well as Vascular Surgery and Anesthesia today.  TRH will f/u in AM.    Assessment & Plan:  Acute dyspnea  LUE AVF edema   Hyperkalemia  ESRD on HD M/W/F  Hepatitis B and C  Substance abuse   DVT prophylaxis: SCDs Code Status: FULL CODE Family Communication: no family present at time of exam  Disposition Plan:   Consultants:  Nephrology   Antimicrobials:  none   Objective: Blood pressure 121/71, pulse (!) 103, temperature 98.1 F (36.7 C), resp. rate 18, weight 77.1 kg (170 lb), SpO2 96 %.  Intake/Output Summary (Last 24 hours) at 05/30/2017 1812 Last data filed at 05/30/2017 1745 Gross per 24 hour  Intake 260 ml  Output 5 ml  Net 255 ml   [facility] Weights   05/29/17 1937  Weight: 77.1 kg (170 lb)    Examination: No exam today   CBC: Recent Labs  Lab 05/26/17 0852 05/29/17 1944  WBC  --  13.4*  NEUTROABS  --  12.5*  HGB 10.5* 10.3*  HCT 31.0* 30.5*  MCV  --  84.5  PLT  --  355   Basic Metabolic Panel: Recent Labs  Lab 05/26/17 0852 05/29/17 1944 05/30/17 0843 05/30/17 1530  NA 129* 130* 130* 132*  K 5.3* 5.8* 7.0* 6.2*  CL 89* 89* 90* 90*  CO2  --  26 27 30   GLUCOSE 116* 353* 151* 106*  BUN 27* 24* 32* 36*  CREATININE  5.40* 5.51* 6.03* 6.49*  CALCIUM  --  10.3 10.6* 10.7*   GFR: Estimated Creatinine Clearance: 13 mL/min (A) (by C-G formula based on SCr of 6.49 mg/dL (H)).  Liver Function Tests: Recent Labs  Lab 05/29/17 1944  AST 64*  ALT 43  ALKPHOS 152*  BILITOT 1.2  PROT 8.3*  ALBUMIN 4.2   No results for input(s): LIPASE, AMYLASE in the last 168 hours. No results for input(s): AMMONIA in the last 168 hours.  Coagulation Profile: No results for input(s): INR, PROTIME in the last 168 hours.  Cardiac Enzymes: No results for input(s): CKTOTAL, CKMB, CKMBINDEX, TROPONINI in the last 168 hours.  HbA1C: Hgb A1c MFr Bld  Date/Time Value Ref Range Status  04/24/2017 03:18 PM 4.6 (L) 4.8 - 5.6 % Final    Comment:    (NOTE) Pre diabetes:          5.7%-6.4% Diabetes:              >6.4% Glycemic control for   <7.0% adults with diabetes   08/17/2016 01:31 AM 4.9 4.8 - 5.6 % Final    Comment:    (NOTE)         Pre-diabetes: 5.7 - 6.4         Diabetes: >  6.4         Glycemic control for adults with diabetes: <7.0     CBG: No results for input(s): GLUCAP in the last 168 hours.  No results found for this or any previous visit (from the past 240 hour(s)).   Scheduled Meds: . [MAR Hold] heparin  3,000 Units Dialysis Once in dialysis   Continuous Infusions: . [MAR Hold] sodium chloride Stopped (05/30/17 1738)  . [MAR Hold] sodium chloride    . lactated ringers       LOS: 1 day   Cherene Altes, MD [facility] Office  [phone] Pager - Text Page per Amion as per below:  On-Call/Text Page:      [email or web address]      password [email or web address]  If 7PM-7AM, please contact night-coverage [email or web address] Password [email or web address] 05/30/2017, 6:12 PM

## 2017-05-30 NOTE — Op Note (Addendum)
Procedure: Ultrasound-guided insertion of Palindrome catheter  Preoperative diagnosis: End-stage renal disease  Postoperative diagnosis: Same  Anesthesia: Local with IV sedation  Operative findings: 23 cm Diatek catheter right internal jugular vein  Operative details: After obtaining informed consent, the patient was taken to the operating room. The patient was placed in supine position on the operating room table. After adequate sedation the patient's entire neck and chest were prepped and draped in usual sterile fashion. The patient was placed in Trendelenburg position. Ultrasound was used to identify the patient's right internal jugular vein. This had normal compressibility and respiratory variation. Local anesthesia was infiltrated over the right jugular vein.  Using ultrasound guidance, the right internal jugular vein was successfully cannulated.  A 0.035 J-tipped guidewire was threaded into the right internal jugular vein and into the superior vena cava followed by the inferior vena cava under fluoroscopic guidance.   Next sequential 12 and 14 dilators were placed over the guidewire into the right atrium.  A 16 French dilator with a peel-away sheath was then placed over the guidewire into the right atrium.   The guidewire and dilator were removed. A 23 cm Palindrome catheter was then placed through the peel away sheath into the right atrium.  The catheter was then tunneled subcutaneously, cut to length, and the hub attached. The catheter was noted to flush and draw easily. The catheter was inspected under fluoroscopy and found with its tip to be in the right atrium without any kinks throughout its course. The catheter was sutured to the skin with nylon sutures. The neck insertion site was closed with Vicryl stitch. The catheter was then loaded with concentrated Heparin solution. A dry sterile dressing was applied.  The patient tolerated procedure well and there were no complications. Instrument  sponge and needle counts correct in the case. The patient was taken to the recovery room in stable condition. Chest x-ray will be obtained in the recovery room.  Ruta Hinds, MD [facility] Office: [phone] Pager: [phone]

## 2017-05-30 NOTE — H&P (Signed)
[facility] SHORT STAY H&P  :    HPI: Infiltration left arm AV fistula yesterday.  Pain in left arm.  Was hyperkalemic today.  Past Medical History:  Diagnosis Date  . Adenomatous colon polyp    tubular  . Anemia   . Anxiety   . Arthritis    left shoulder  . Atherosclerosis of aorta ([place])   . Cardiomegaly   . Chest pain    DATE UNKNOWN, C/O PERIODICALLY  . Chronic kidney disease    dialysis - M/W/F  . Cocaine abuse ([place])   . COPD exacerbation ([place]) 08/17/2016  . Dialysis patient ([place])    Monday-Wednesday-Friday  . GERD (gastroesophageal reflux disease)    DATE UNKNOWN  . Hemorrhoids   . Hepatitis B, chronic ([place])   . Hepatitis C   . Hyperkalemia   . Hypertension   . Metabolic bone disease    Patient denies  . Nephrolithiasis   . Pneumonia   . Pulmonary edema   . Renal disorder   . Renal insufficiency   . Shortness of breath dyspnea    " for the last past year with this dialysis"  . Tubular adenoma of colon     FH:  Non-Contributory  Social HX Social History   Tobacco Use  . Smoking status: Current Every Day Smoker    Packs/day: 0.50    Years: 43.00    Pack years: 21.50    Types: Cigarettes    Start date: 08/13/1973  . Smokeless tobacco: Never Used  . Tobacco comment: Less than 1/2 pk per day  Substance Use Topics  . Alcohol use: Yes    Alcohol/week: 2.4 oz    Types: 1 Glasses of wine, 1 Cans of beer, 1 Shots of liquor, 1 Standard drinks or equivalent per week    Comment: occasional   . Drug use: Yes    Types: Marijuana, Cocaine    Allergies Allergies  Allergen Reactions  . Aspirin Other (See Comments)    STOMACH PAIN  . Clonidine Derivatives Itching  . Tramadol Itching    Medications Current Facility-Administered Medications  Medication Dose Route Frequency Provider Last Rate Last Dose  . [MAR Hold] 0.9 %  sodium chloride infusion  100 mL Intravenous PRN Roney Jaffe, MD      . Doug Sou Hold] 0.9 %  sodium chloride infusion   100 mL Intravenous PRN Roney Jaffe, MD      . Doug Sou Hold] acetaminophen (TYLENOL) tablet 650 mg  650 mg Oral Q6H PRN Cherene Altes, MD      . Doug Sou Hold] alteplase (CATHFLO ACTIVASE) injection 2 mg  2 mg Intracatheter Once PRN Roney Jaffe, MD      . cefUROXime (ZINACEF) 1.5 g in dextrose 5 % 50 mL IVPB  1.5 g Intravenous To SS-Surg Waynetta Sandy, MD      . Doug Sou Hold] fentaNYL (SUBLIMAZE) injection 12.5 mcg  12.5 mcg Intravenous Q2H PRN Cherene Altes, MD   12.5 mcg at 05/30/17 1120  . [MAR Hold] heparin injection 1,000 Units  1,000 Units Dialysis PRN Roney Jaffe, MD      . Doug Sou Hold] heparin injection 3,000 Units  3,000 Units Dialysis Once in dialysis Roney Jaffe, MD      . Doug Sou Hold] lidocaine (PF) (XYLOCAINE) 1 % injection 5 mL  5 mL Intradermal PRN Roney Jaffe, MD      . Doug Sou Hold] lidocaine-prilocaine (EMLA) cream 1 application  1 application Topical PRN Roney Jaffe, MD      . [  MAR Hold] pentafluoroprop-tetrafluoroeth (GEBAUERS) aerosol 1 application  1 application Topical PRN Roney Jaffe, MD        Labs  BMET    Component Value Date/Time   NA 130 (L) 05/30/2017 0843   K 7.0 (HH) 05/30/2017 0843   CL 90 (L) 05/30/2017 0843   CO2 27 05/30/2017 0843   GLUCOSE 151 (H) 05/30/2017 0843   BUN 32 (H) 05/30/2017 0843   CREATININE 6.03 (H) 05/30/2017 0843   CREATININE 9.18 (H) 02/15/2017 1425   CALCIUM 10.6 (H) 05/30/2017 0843   CALCIUM 8.1 (L) 07/11/2011 1117   GFRNONAA 10 (L) 05/30/2017 0843   GFRNONAA 6 (L) 02/15/2017 1425   GFRAA 11 (L) 05/30/2017 0843   GFRAA 7 (L) 02/15/2017 1425    CBC    Component Value Date/Time   WBC 13.4 (H) 05/29/2017 1944   RBC 3.61 (L) 05/29/2017 1944   HGB 10.3 (L) 05/29/2017 1944   HCT 30.5 (L) 05/29/2017 1944   PLT 170 05/29/2017 1944   MCV 84.5 05/29/2017 1944   MCH 28.5 05/29/2017 1944   MCHC 33.8 05/29/2017 1944   RDW 18.0 (H) 05/29/2017 1944   LYMPHSABS 0.7 05/29/2017 1944   MONOABS 0.2  05/29/2017 1944   EOSABS 0.0 05/29/2017 1944   BASOSABS 0.0 05/29/2017 1944    PHYSICAL EXAM  Vitals:   05/30/17 1315 05/30/17 1330  BP: (!) 133/58 115/75  Pulse: (!) 113 (!) 110  Resp: 17 (!) 24  SpO2: 98% 98%    General:  WDWN in NAD HENT: WNL Eyes: Pupils equal Pulmonary: normal non-labored breathing , without Rales, rhonchi,  wheezing Cardiac: RRR, Vascular Exam/Pulses:pulsatile left arm AV fistula with ecchymosis 7 cm hematoma blistered skin  Neuro A&O x 3; good sensation; motion in all extremities  Impression: Hematoma left arm post infiltration with compromised skin  Plan: I and D left arm insert tunneled dialysis catheter  Ruta Hinds @TODAY @ 3:06 PM

## 2017-05-30 NOTE — ED Notes (Signed)
Report given to [employer].

## 2017-05-30 NOTE — ED Notes (Signed)
Wheeled pt to restroom. Pt able to stand with no assistance, tolerated well.

## 2017-05-30 NOTE — ED Notes (Signed)
Dr Maudie Mercury gave orders for a ameal and the meds ordered  Earlier tonight that the pt would not take approx 2 hours ago  Breakfast ordered

## 2017-05-30 NOTE — ED Notes (Signed)
Phamary called to verified med's. Pt persistent to go to the restroom, refuses bed side.

## 2017-05-30 NOTE — ED Notes (Signed)
The pt has taken his bp cuff off    Again  Argumentative  Unhappy with everything.  meds at bedside  He is delaying .  He is in the br  Again.  He wanted his lt upper arm dressed  I have placed kerlex on the  wolounds

## 2017-05-30 NOTE — Consult Note (Signed)
Chief Complaint: Patient was seen in consultation today for tunneled Hemodialysis catheter placement Chief Complaint  Patient presents with  . Shortness of Breath   at the request of Dr Jonnie Finner  Supervising Physician: Sandi Mariscal  Patient Status: [facility] - ED  History of Present Illness: [name] is a 56 y.o. male   Pt SOB and left arm pain To ED 11/18 pm Known Polysubstance abuser COPD; Hep B and C CAD with recent PCI 02/2017 ESRD MWF Worsening SOB and cough Also left arm dialysis fistula with new blisters and swelling and pain  From Dr Jonnie Finner ED Note: Admitted: Feb 2018 - acute resp failure/ pulm edema. Had HD then left AMA.   Feb 2018 - acute resp failure/ HTN urgency/ fluid overload - improved with HD. Left AMA.  Aug 2018 - admitted w/ CP, underwent heart cath w/ 90% prox RCA, stented on 8/14.  afib w RVR, known HTN CM and LBBB.  Started on BB. Started on warfarin.  Sept 2018 - admitted with SOB/ pulm edema, missing full rx' on HD as OP. Had HD overnight and then left AMA in the morning.  Oct 2018 - presented with SOB and acute resp failure/ pulm edema.  Improved after HD. AMS resolved. Mild hyperkalemia. abd distension/ ascites, not able to tap w/o a large single pocket  Now needs tunneled dialysis catheter placement Secondary volume overload Pt has been noncompliant in dialysis ---leaving ealy Attempted temp placement in IR just now (pt ate Breakfast 630 am) Pt refused to move ahead without sedation Will keep npo and retry this afternoon.   AV Fistulogram 05/26/17:  Widely patent central venous system: <50% stenosis at cephalic vein/subclavian vein confluence  Patent fistula with >6 mm diameter throughout: small pseudoaneurysm proximally, moderate bilobate pseudoaneurysm in mid-segment and moderate sized broad pseudoaneurysm distally  Widely patent anastomosis  Fistula looks salvageable with pseudoaneurysm plication  Past Medical History:  Diagnosis  Date  . Adenomatous colon polyp    tubular  . Anemia   . Anxiety   . Arthritis    left shoulder  . Atherosclerosis of aorta ([place])   . Cardiomegaly   . Chest pain    DATE UNKNOWN, C/O PERIODICALLY  . Chronic kidney disease    dialysis - M/W/F  . Cocaine abuse ([place])   . COPD exacerbation ([place]) 08/17/2016  . Dialysis patient ([place])    Monday-Wednesday-Friday  . GERD (gastroesophageal reflux disease)    DATE UNKNOWN  . Hemorrhoids   . Hepatitis B, chronic ([place])   . Hepatitis C   . Hyperkalemia   . Hypertension   . Metabolic bone disease    Patient denies  . Nephrolithiasis   . Pneumonia   . Pulmonary edema   . Renal disorder   . Renal insufficiency   . Shortness of breath dyspnea    " for the last past year with this dialysis"  . Tubular adenoma of colon     Past Surgical History:  Procedure Laterality Date  . A/V FISTULAGRAM Left 05/26/2017   Performed by Conrad Luna Pier, MD at [facility]  . AV FISTULA PLACEMENT  2012   BELIEVED WAS PLACED IN JUNE  . COLONOSCOPY    . CORONARY STENT INTERVENTION N/A 02/22/2017   Performed by Nigel Mormon, MD at [facility]  . HEMORRHOID BANDING    . LEFT HEART CATH AND CORONARY ANGIOGRAPHY N/A 02/22/2017   Performed by Nigel Mormon, MD at [facility]  . PLICATION OF DISTAL ANEURYSMAL SEGEMENT OF LEFT UPPER ARM ARTERIOVENOUS FISTULA Left 01/20/2016   Performed by Elam Dutch, MD at [facility]  . Plication of Left Arm Arteriovenous Fistula Left 12/14/2015   Performed by Elam Dutch, MD at [facility]  . Plication of Left Upper Arm Fistula  Left 07/24/2016   Performed by Waynetta Sandy, MD at [facility]  . POLYPECTOMY      Allergies: Aspirin; Clonidine derivatives; and Tramadol  Medications: Prior to Admission medications   Medication Sig Start Date End Date Taking? Authorizing Provider  acetaminophen (TYLENOL) 500 MG tablet Take 500-1,000 mg every 6 (six) hours as needed by mouth for headache  (pain).    Yes [provider]  albuterol (PROVENTIL HFA;VENTOLIN HFA) 108 (90 Base) MCG/ACT inhaler Inhale 2 puffs into the lungs every 6 (six) hours as needed for wheezing or shortness of breath. 03/10/17  Yes Javier Glazier, MD  aspirin EC 81 MG tablet Take 81 mg by mouth daily.   Yes [provider]  clopidogrel (PLAVIX) 75 MG tablet Take one tablet (75mg ) daily. 04/07/17  Yes [provider]  gabapentin (NEURONTIN) 100 MG capsule Take 100 mg 2 (two) times daily by mouth.   Yes [provider]  minoxidil (LONITEN) 10 MG tablet Take 10 mg 2 (two) times daily as needed by mouth (when blood pressure cuff reads "above normal").  05/02/17  Yes [provider]  nicotine (NICODERM CQ - DOSED IN MG/24 HOURS) 14 mg/24hr patch Place 14 mg daily onto the skin. 03/30/17  Yes [provider]  ondansetron (ZOFRAN) 4 MG tablet Take 4 mg every 8 (eight) hours as needed by mouth for nausea or vomiting.  04/27/17  Yes [provider]  oxycodone (OXY-IR) 5 MG capsule Take 5 mg 2 (two) times daily as needed by mouth for pain.   Yes [provider]  predniSONE (DELTASONE) 20 MG tablet 3 tabs po daily x 3 days, then 2 tabs x 3 days, then 1.5 tabs x 3 days, then 1 tab x 3 days, then 0.5 tabs x 3 days Patient taking differently: Take 10-60 mg See admin instructions by mouth. Take 3 tabs (60 mg) by mouth daily x 3 days, then 2 tabs (40 mg) x 3 days, then 1.5 tabs (30 mg) x 3 days, then 1 tab (20 mg) x 3 days, then 0.5 tabs (10 mg) x 3 days, then stop 05/29/17  Yes Pfeiffer, Jeannie Done, MD  sevelamer carbonate (RENVELA) 800 MG tablet Take 3 tablets (2,400 mg total) by mouth 3 (three) times daily with meals. Patient taking differently: Take 3,200 mg 3 (three) times daily with meals by mouth.  03/26/17  Yes Cherene Altes, MD  calcitRIOL (ROCALTROL) 0.25 MCG capsule Take 3 capsules (0.75 mcg total) by mouth every Monday, Wednesday, and Friday with  hemodialysis. 03/28/17   Cherene Altes, MD  predniSONE (DELTASONE) 20 MG tablet Take 2 tablets (40 mg total) daily by mouth. Patient not taking: Reported on 05/29/2017 05/22/17   Blanchie Dessert, MD  Spacer/Aero-Holding Chambers (AEROCHAMBER MV) inhaler Use as instructed 03/10/17   Javier Glazier, MD     Family History  Problem Relation Age of Onset  . Heart disease Mother   . Lung cancer Mother   . Heart disease Father   . Malignant hyperthermia Father   . COPD Father   . Throat cancer Sister   . Esophageal cancer Sister   . Hypertension  Other   . COPD Other   . Colon cancer Neg Hx   . Colon polyps Neg Hx   . Rectal cancer Neg Hx   . Stomach cancer Neg Hx     Social History   Socioeconomic History  . Marital status: Single    Spouse name: None  . Number of children: 3  . Years of education: 10  . Highest education level: None  Social Needs  . Financial resource strain: None  . Food insecurity - worry: None  . Food insecurity - inability: None  . Transportation needs - medical: None  . Transportation needs - non-medical: None  Occupational History  . Occupation: Unemployed  Tobacco Use  . Smoking status: Current Every Day Smoker    Packs/day: 0.50    Years: 43.00    Pack years: 21.50    Types: Cigarettes    Start date: 08/13/1973  . Smokeless tobacco: Never Used  . Tobacco comment: Less than 1/2 pk per day  Substance and Sexual Activity  . Alcohol use: Yes    Alcohol/week: 2.4 oz    Types: 1 Glasses of wine, 1 Cans of beer, 1 Shots of liquor, 1 Standard drinks or equivalent per week    Comment: occasional   . Drug use: Yes    Types: Marijuana, Cocaine  . Sexual activity: None    Comment: "NOT LATELY" on cocaine  Other Topics Concern  . None  Social History Narrative   Lives alone   Caffeine use: Coffee-rare   Soda- daily      [facility] (03/10/17):   Originally from [facility]. Previously worked trimming trees. No pets currently. No bird or mold  exposure.     Review of Systems: A 12 point ROS discussed and pertinent positives are indicated in the HPI above.  All other systems are negative.  Review of Systems  Constitutional: Positive for activity change, diaphoresis and fatigue. Negative for fever.  Respiratory: Positive for cough and shortness of breath.   Gastrointestinal: Negative for abdominal distention, nausea and vomiting.  Musculoskeletal: Negative for back pain and neck pain.  Neurological: Negative for weakness.  Psychiatric/Behavioral: Negative for behavioral problems and confusion.    Vital Signs: BP (!) 153/69   Pulse (!) 106   Resp 17   Wt 170 lb (77.1 kg)   SpO2 94%   BMI 25.10 kg/m   Physical Exam  Constitutional: He is oriented to person, place, and time.  Neck: Neck supple.  Cardiovascular: Normal rate, regular rhythm and normal heart sounds.  Pulmonary/Chest: Effort normal. He has wheezes in the right upper field and the left upper field.  Abdominal: Soft. Bowel sounds are normal.  Neurological: He is alert and oriented to person, place, and time.  Skin: Skin is warm and dry.  Left upper arm dialysis fistula wrapped up Swollen; blisters  Psychiatric: He has a normal mood and affect. His behavior is normal.  Nursing note and vitals reviewed.   Imaging: Dg Chest 2 View  Result Date: 05/22/2017 CLINICAL DATA:  Shortness of breath and hypertension EXAM: CHEST  2 VIEW COMPARISON:  May 02, 2017 FINDINGS: There is mild elevation of the left hemidiaphragm, stable. There is mild left base atelectasis. Lungs elsewhere clear. There is cardiomegaly with pulmonary venous hypertension. There is aortic atherosclerosis. No evident adenopathy. No bone lesions. IMPRESSION: Left base atelectasis. No edema or consolidation. Stable cardiomegaly. There is aortic atherosclerosis. Aortic Atherosclerosis (ICD10-I70.0). Electronically Signed   By: Lowella Grip III M.D.  On: 05/22/2017 13:04   Dg Chest Portable 1  View  Result Date: 05/29/2017 CLINICAL DATA:  Shortness of Breath EXAM: PORTABLE CHEST 1 VIEW COMPARISON:  05/22/2017 FINDINGS: Cardiac shadow remains enlarged. Aortic calcifications are again seen. The lungs are well aerated bilaterally with mild interstitial changes consistent with early edema. No focal infiltrate or sizable effusion is noted. IMPRESSION: Mild interstitial edema. Electronically Signed   By: Inez Catalina M.D.   On: 05/29/2017 20:29   Dg Chest Port 1 View  Result Date: 05/02/2017 CLINICAL DATA:  56 y/o M; shortness of breath with cough and congestion. EXAM: PORTABLE CHEST 1 VIEW COMPARISON:  04/24/2017 chest radiograph. FINDINGS: Stable severe cardiomegaly. Pulmonary vascular congestion. No focal consolidation. No pleural effusion or pneumothorax. Aortic atherosclerosis with calcification. Bones are unremarkable. IMPRESSION: Stable severe cardiomegaly.  Pulmonary vascular congestion. Electronically Signed   By: Kristine Garbe M.D.   On: 05/02/2017 05:58    Labs:  CBC: Recent Labs    04/24/17 0752 04/25/17 1345 05/22/17 1236 05/26/17 0852 05/29/17 1944  WBC 12.9* 9.8 8.8  --  13.4*  HGB 10.8* 9.5* 10.5* 10.5* 10.3*  HCT 31.5* 28.1* 30.3* 31.0* 30.5*  PLT 71* 72* 120*  --  170    COAGS: Recent Labs    02/21/17 0235 02/23/17 0547 03/24/17 2342 03/25/17 0858  INR 1.64 1.26 >10.00* 1.17    BMP: Recent Labs    04/25/17 1345 05/22/17 1236 05/22/17 1437 05/26/17 0852 05/29/17 1944 05/30/17 0843  NA 128* 126*  --  129* 130* 130*  K 4.4 6.9* 6.7* 5.3* 5.8* 7.0*  CL 89* 85*  --  89* 89* 90*  CO2 26 28  --   --  26 27  GLUCOSE 124* 93  --  116* 353* 151*  BUN 13 23*  --  27* 24* 32*  CALCIUM 7.5* 9.7  --   --  10.3 10.6*  CREATININE 6.84* 6.45*  --  5.40* 5.51* 6.03*  GFRNONAA 8* 9*  --   --  11* 10*  GFRAA 9* 10*  --   --  12* 11*    LIVER FUNCTION TESTS: Recent Labs    04/22/17 2002  04/23/17 0759 04/25/17 1345 05/22/17 1236  05/29/17 1944  BILITOT 1.1  --  1.2  --  1.3* 1.2  AST 25  --  22  --  45* 64*  ALT 10*  --  9*  --  21 43  ALKPHOS 117  --  103  --  108 152*  PROT 7.2  --  7.1  --  7.5 8.3*  ALBUMIN 4.0   < > 3.9 3.7 4.1 4.2   < > = values in this interval not displayed.    TUMOR MARKERS: No results for input(s): AFPTM, CEA, CA199, CHROMGRNA in the last 8760 hours.  Assessment and Plan:  Left arm dialysis fistula malfunction Swollen; blistered Scheduled for tunneled dialysis catheter placement Risks and benefits discussed with the patient including, but not limited to bleeding, infection, vascular injury, pneumothorax which may require chest tube placement, air embolism or even death All of the patient's questions were answered, patient is agreeable to proceed. Consent signed and in chart.  Thank you for this interesting consult.  I greatly enjoyed meeting [name] and look forward to participating in their care.  A copy of this report was sent to the requesting provider on this date.  Electronically Signed: Lavonia Drafts, PA-C 05/30/2017, 10:01 AM   I spent a total of  30 Minutes   in face to face in clinical consultation, greater than 50% of which was counseling/coordinating care for tunneled HD catheter placement

## 2017-05-30 NOTE — ED Notes (Signed)
Pt transported to IR 

## 2017-05-30 NOTE — Progress Notes (Signed)
See note  From  0239 from Dr. Jonnie Finner regarding inability to run ordered HD tx for tonight

## 2017-05-30 NOTE — Progress Notes (Addendum)
Unable to dialyze patient tonight.  The AVF was stuck w/o difficulty but access pressures were so high that only BFR of 135/ min was attainable.  [place] machine not working so temp cath was aborted. Pt stable, will consult IR to see for HD cath.  Will need VVS input for AVF as well, may need ligation.   [name] MD [employer] 05/30/2017, 2:41 AM

## 2017-05-30 NOTE — ED Notes (Addendum)
Short stay called ready for report Called [phone] - no answer

## 2017-05-31 ENCOUNTER — Ambulatory Visit: Payer: Self-pay

## 2017-05-31 ENCOUNTER — Encounter (HOSPITAL_COMMUNITY): Payer: Self-pay | Admitting: Vascular Surgery

## 2017-05-31 ENCOUNTER — Inpatient Hospital Stay (HOSPITAL_COMMUNITY): Payer: Medicare Other

## 2017-05-31 ENCOUNTER — Other Ambulatory Visit: Payer: Self-pay

## 2017-05-31 DIAGNOSIS — J410 Simple chronic bronchitis: Secondary | ICD-10-CM

## 2017-05-31 LAB — POCT I-STAT 4, (NA,K, GLUC, HGB,HCT)
Glucose, Bld: 110 mg/dL — ABNORMAL HIGH (ref 65–99)
HCT: 26 % — ABNORMAL LOW (ref 39.0–52.0)
Hemoglobin: 8.8 g/dL — ABNORMAL LOW (ref 13.0–17.0)
Potassium: 6.1 mmol/L — ABNORMAL HIGH (ref 3.5–5.1)
Sodium: 131 mmol/L — ABNORMAL LOW (ref 135–145)

## 2017-05-31 LAB — TROPONIN I
Troponin I: 0.35 ng/mL (ref <0.03)
Troponin I: 0.27 ng/mL (ref <0.03)

## 2017-05-31 MED ORDER — HYDROCORTISONE 0.5 % EX CREA
TOPICAL_CREAM | Freq: Four times a day (QID) | CUTANEOUS | Status: DC
  Filled 2017-05-31: qty 28.35

## 2017-05-31 MED ORDER — ZOLPIDEM TARTRATE 5 MG PO TABS
5.0000 mg | ORAL_TABLET | Freq: Once | ORAL | Status: DC
  Filled 2017-05-31: qty 1

## 2017-05-31 MED ORDER — MINOXIDIL 10 MG PO TABS: 10.0000 mg | ORAL_TABLET | Freq: Two times a day (BID) | ORAL | Status: DC | PRN

## 2017-05-31 MED ORDER — SODIUM CHLORIDE 0.9% FLUSH: 3.0000 mL | INTRAVENOUS | Status: DC | PRN

## 2017-05-31 MED ORDER — CLOPIDOGREL BISULFATE 75 MG PO TABS
75.0000 mg | ORAL_TABLET | Freq: Every day | ORAL | Status: DC
  Administered 2017-05-31 – 2017-06-01 (×2): 75 mg via ORAL
  Filled 2017-05-31 (×2): qty 1

## 2017-05-31 MED ORDER — HYDROCORTISONE 0.5 % EX CREA
TOPICAL_CREAM | Freq: Four times a day (QID) | CUTANEOUS | Status: DC | PRN
  Administered 2017-06-03: 06:00:00 via TOPICAL
  Filled 2017-05-31: qty 28.35

## 2017-05-31 MED ORDER — ASPIRIN EC 81 MG PO TBEC
81.0000 mg | DELAYED_RELEASE_TABLET | Freq: Every day | ORAL | Status: DC
  Administered 2017-05-31 – 2017-06-01 (×2): 81 mg via ORAL
  Filled 2017-05-31 (×2): qty 1

## 2017-05-31 MED ORDER — SODIUM CHLORIDE 0.9 % IV SOLN: 250.0000 mL | INTRAVENOUS | Status: DC | PRN

## 2017-05-31 MED ORDER — SEVELAMER CARBONATE 800 MG PO TABS
3200.0000 mg | ORAL_TABLET | Freq: Three times a day (TID) | ORAL | Status: DC
  Administered 2017-05-31 – 2017-06-10 (×25): 3200 mg via ORAL
  Filled 2017-05-31 (×25): qty 4

## 2017-05-31 MED ORDER — NICOTINE 14 MG/24HR TD PT24
14.0000 mg | MEDICATED_PATCH | Freq: Every day | TRANSDERMAL | Status: DC
  Administered 2017-05-31 – 2017-06-01 (×2): 14 mg via TRANSDERMAL
  Filled 2017-05-31 (×2): qty 1

## 2017-05-31 MED ORDER — MINOXIDIL 2.5 MG PO TABS
5.0000 mg | ORAL_TABLET | Freq: Two times a day (BID) | ORAL | Status: DC | PRN
  Administered 2017-06-04: 5 mg via ORAL
  Filled 2017-05-31: qty 2

## 2017-05-31 MED ORDER — ALBUTEROL SULFATE (2.5 MG/3ML) 0.083% IN NEBU: 3.0000 mL | INHALATION_SOLUTION | RESPIRATORY_TRACT | Status: DC | PRN

## 2017-05-31 MED ORDER — ACETAMINOPHEN 325 MG PO TABS
650.0000 mg | ORAL_TABLET | Freq: Four times a day (QID) | ORAL | Status: DC | PRN
  Administered 2017-05-31 – 2017-06-10 (×4): 650 mg via ORAL
  Filled 2017-05-31 (×3): qty 2

## 2017-05-31 MED ORDER — CALCITRIOL 0.25 MCG PO CAPS
0.7500 ug | ORAL_CAPSULE | ORAL | Status: DC
  Administered 2017-06-01 – 2017-06-10 (×5): 0.75 ug via ORAL
  Filled 2017-05-31 (×2): qty 3

## 2017-05-31 MED ORDER — ACETAMINOPHEN 650 MG RE SUPP: 650.0000 mg | Freq: Four times a day (QID) | RECTAL | Status: DC | PRN

## 2017-05-31 MED ORDER — CINACALCET HCL 30 MG PO TABS
120.0000 mg | ORAL_TABLET | ORAL | Status: DC
  Administered 2017-06-08: 120 mg via ORAL
  Filled 2017-05-31 (×2): qty 4

## 2017-05-31 MED ORDER — SODIUM CHLORIDE 0.9% FLUSH
3.0000 mL | Freq: Two times a day (BID) | INTRAVENOUS | Status: DC
  Administered 2017-05-31 – 2017-06-07 (×9): 3 mL via INTRAVENOUS

## 2017-05-31 MED ORDER — ONDANSETRON HCL 4 MG PO TABS
4.0000 mg | ORAL_TABLET | Freq: Three times a day (TID) | ORAL | Status: DC | PRN
  Administered 2017-06-01 – 2017-06-06 (×2): 4 mg via ORAL
  Filled 2017-05-31 (×2): qty 1

## 2017-05-31 MED ORDER — ALBUTEROL SULFATE (2.5 MG/3ML) 0.083% IN NEBU: 3.0000 mL | INHALATION_SOLUTION | Freq: Four times a day (QID) | RESPIRATORY_TRACT | Status: DC | PRN

## 2017-05-31 MED ORDER — OXYCODONE HCL 5 MG PO TABS
5.0000 mg | ORAL_TABLET | Freq: Two times a day (BID) | ORAL | Status: DC | PRN
  Administered 2017-05-31 – 2017-06-01 (×4): 5 mg via ORAL
  Filled 2017-05-31 (×3): qty 1

## 2017-05-31 NOTE — Care Management Note (Signed)
Case Management Note Marvetta Gibbons RN, BSN Unit 4E-Case Manager [phone]  Patient Details  Name: [name] MRN: [id number] Date of Birth: 06-Aug-1960  Subjective/Objective:   Pt admitted with SOB- fistula pseudoaneurysm- s/p Diatek cath placement on 11/19-                 Action/Plan: PTA pt lived at home- hx cocaine abuse- PCP- Osei-Bonsu- CM to follow for d/c needs  Expected Discharge Date:                  Expected Discharge Plan:  Home/Self Care  In-House Referral:  Clinical Social Work  Discharge planning Services  CM Consult  Post Acute Care Choice:    Choice offered to:     DME Arranged:    DME Agency:     HH Arranged:    [place] Agency:     Status of Service:  In process, will continue to follow  If discussed at Long Length of Stay Meetings, dates discussed:    Additional Comments:  [name], RN 05/31/2017, 3:48 PM

## 2017-05-31 NOTE — Progress Notes (Signed)
05/31/2017 1600 Multiple attempts to put tele back on.  Pt will not allow [place] to.  Explained reasons for the monitoring-pt still refuses to let us put it on. [place]

## 2017-05-31 NOTE — Progress Notes (Addendum)
[facility]  Subjective  - Pain is a big issue.    Objective 116/80 100 97.9 F (36.6 C) (Axillary) (!) 23 98%  Intake/Output Summary (Last 24 hours) at 05/31/2017 0736 Last data filed at 05/30/2017 2320 Gross per 24 hour  Intake 260 ml  Output 1405 ml  Net -1145 ml    Left arm ace wrap in place Minimal active range of motion due to pain Right diatek in place   Assessment/Planning: POD # 1 23 cm Diatek catheter right internal jugular vein  Pain issues    Roxy Horseman 05/31/2017 7:36 AM -- Nothing focal to drain in left arm.  Some edema.  Skin still compromised but maybe a little improved today Continue ACE wrap.  Elevate arm above level of heart Xeroform gauze ACE gently over fistula  Ok to d/c from our standpoint but will continue to follow while in house  Ruta Hinds, MD [facility] [place]: [phone] Pager: [phone]  Laboratory Lab Results: Recent Labs    05/29/17 1944 05/30/17 1535  WBC 13.4*  --   HGB 10.3* 8.8*  HCT 30.5* 26.0*  PLT 170  --    BMET Recent Labs    05/30/17 0843 05/30/17 1530 05/30/17 1535  NA 130* 132* 131*  K 7.0* 6.2* 6.1*  CL 90* 90*  --   CO2 27 30  --   GLUCOSE 151* 106* 110*  BUN 32* 36*  --   CREATININE 6.03* 6.49*  --   CALCIUM 10.6* 10.7*  --     COAG Lab Results  Component Value Date   INR 1.17 03/25/2017   INR >10.00 (HH) 03/24/2017   INR 1.26 02/23/2017   No results found for: PTT

## 2017-05-31 NOTE — Progress Notes (Addendum)
[facility] TEAM 1 - Stepdown/ICU TEAM  MERIK MIGNANO  ZJI:[id number] DOB: 1961/04/15 DOA: 05/29/2017 PCP: [name], MD    Brief Narrative:  56 y.o. male w/ hx HTN, coccaine abuse, Hep B/C, COPD, Systolic CHF (EF 17-51%), and ESRD on HD (M, W, F) who presented to the ED w/ c/o dyspnea as well as sudden swelling in his left upper extremity.   He had a L brachiocephalic AVF shuntogram on 05/26/2017 which noted a patent fistula with >15mm small pseudoaneurysm proximally and moderate bilobe pseudoaneurysm in the mid segment and moderate sized broad pseudoaneurysm distally.   Significant Events: 11/18 admit to [facility]  11/19 to OR for 23 cm Diatek catheter right internal jugular vein  11/19 signed off HD early   Subjective: Resting comfortably at the time of my exam.  Does not open eyes to voice or exam.  No respiratory distress.  No evident pain.     Assessment & Plan:  Acute dyspnea - Pulmonary Edema  Resolved w/ sats now 90+% on RA  Mildly elevated troponin Follow trend to assure peak is noted - only modestly elevated for now - likely demand ischemia in setting of signif volume overload - keep on tele - had a L heart cath w/ stenting of a high grade prox RCA stenosis 02/22/17 and was to take Plavix for 6 months - reportedly allergic to ASA  LUE AVF edema/infiltration   Care as per Nephrology - no surgical intervention indicated per Vasc Surgery   Hx of Afib/Aflutter w/ abarency  S/p DCCV 03/24/17 - coumadin discontinued as pt was noncompliant   Hyperkalemia Corrected w/ HD   ESRD on HD M/W/F Has apparently refused HD today, after having signed off early during yesterday's tx  Hepatitis B and C Has reportedly been tx for Hep C  Substance abuse   Anemia of CKD Following Hgb   DVT prophylaxis: SCDs Code Status: FULL CODE Family Communication: no family present at time of exam  Disposition Plan: as per Nephrology   Consultants:  Nephrology  Vascular Surgery    Antimicrobials:  none   Objective: Blood pressure 120/78, pulse (!) 122, temperature 97.8 F (36.6 C), temperature source Oral, resp. rate (!) 23, weight 75.5 kg (166 lb 7.2 oz), SpO2 92 %.  Intake/Output Summary (Last 24 hours) at 05/31/2017 1510 Last data filed at 05/31/2017 0258 Gross per 24 hour  Intake 153 ml  Output 1405 ml  Net -1252 ml   Filed Weights   05/30/17 2055 05/30/17 2320 05/31/17 0438  Weight: 77 kg (169 lb 12.1 oz) 75.5 kg (166 lb 7.2 oz) 75.5 kg (166 lb 7.2 oz)    Examination: General: No acute respiratory distress Lungs: Clear to auscultation bilaterally  Cardiovascular: Regular rate and rhythm Abdomen: Nondistended, soft, bowel sounds positive Extremities: No significant edema bilateral lower extremities   CBC: Recent Labs  Lab 05/26/17 0852 05/29/17 1944 05/30/17 1535  WBC  --  13.4*  --   NEUTROABS  --  12.5*  --   HGB 10.5* 10.3* 8.8*  HCT 31.0* 30.5* 26.0*  MCV  --  84.5  --   PLT  --  170  --    Basic Metabolic Panel: Recent Labs  Lab 05/26/17 0852 05/29/17 1944 05/30/17 0843 05/30/17 1530 05/30/17 1535  NA 129* 130* 130* 132* 131*  K 5.3* 5.8* 7.0* 6.2* 6.1*  CL 89* 89* 90* 90*  --   CO2  --  26 27 30   --   GLUCOSE  116* 353* 151* 106* 110*  BUN 27* 24* 32* 36*  --   CREATININE 5.40* 5.51* 6.03* 6.49*  --   CALCIUM  --  10.3 10.6* 10.7*  --    GFR: Estimated Creatinine Clearance: 13 mL/min (A) (by C-G formula based on SCr of 6.49 mg/dL (H)).  Liver Function Tests: Recent Labs  Lab 05/29/17 1944  AST 64*  ALT 43  ALKPHOS 152*  BILITOT 1.2  PROT 8.3*  ALBUMIN 4.2    Cardiac Enzymes: Recent Labs  Lab 05/31/17 1211  TROPONINI 0.35*    HbA1C: Hgb A1c MFr Bld  Date/Time Value Ref Range Status  04/24/2017 03:18 PM 4.6 (L) 4.8 - 5.6 % Final    Comment:    (NOTE) Pre diabetes:          5.7%-6.4% Diabetes:              >6.4% Glycemic control for   <7.0% adults with diabetes   08/17/2016 01:31 AM 4.9 4.8 -  5.6 % Final    Comment:    (NOTE)         Pre-diabetes: 5.7 - 6.4         Diabetes: >6.4         Glycemic control for adults with diabetes: <7.0     Scheduled Meds: . aspirin EC  81 mg Oral Daily  . [START ON 06/01/2017] calcitRIOL  0.75 mcg Oral Q M,W,F-HD  . [START ON 06/01/2017] cinacalcet  120 mg Oral Q M,W,F-HD  . clopidogrel  75 mg Oral Daily  . gabapentin  100 mg Oral BID  . heparin  3,000 Units Dialysis Once in dialysis  . heparin  4,000 Units Dialysis Once in dialysis  . nicotine  14 mg Transdermal Daily  . sevelamer carbonate  3,200 mg Oral TID WC  . sodium chloride flush  3 mL Intravenous Q12H     LOS: 2 days   Cherene Altes, MD [facility] Office  [phone] Pager - Text Page per Shea Evans as per below:  On-Call/Text Page:      [email or web address]      password [email or web address]  If 7PM-7AM, please contact night-coverage [email or web address] Password [email or web address] 05/31/2017, 3:10 PM

## 2017-05-31 NOTE — Progress Notes (Signed)
[facility] Progress Note   Subjective: appreciate that Dr. Oneida Alar placed [facility] last night- then had HD- signed off early - removed 1400 only - many complaints about everything today   Objective Vitals:   05/30/17 2230 05/30/17 2300 05/30/17 2320 05/31/17 0438  BP: 110/65 92/60 127/70 116/80  Pulse: 91 98 100   Resp:      Temp:   98 F (36.7 C) 97.9 F (36.6 C)  TempSrc:    Axillary  SpO2:   98%   Weight:   75.5 kg (166 lb 7.2 oz) 75.5 kg (166 lb 7.2 oz)   Physical Exam General: Awake, alert, NAD- new right sided PC Heart: S1,S2 RRR Tachy-HR 99-115 Lungs: bilateral breath sounds, slightly decreased in bases, sounds tight with coarse breath sounds Abdomen: active BS SNT Extremities: trace pre-tib edema Dialysis Access: LUA AVF: wrapped  AVF + bruit. AVF wrapped in cling.    Additional Objective Labs: Basic Metabolic Panel: Recent Labs  Lab 05/29/17 1944 05/30/17 0843 05/30/17 1530 05/30/17 1535  NA 130* 130* 132* 131*  K 5.8* 7.0* 6.2* 6.1*  CL 89* 90* 90*  --   CO2 26 27 30   --   GLUCOSE 353* 151* 106* 110*  BUN 24* 32* 36*  --   CREATININE 5.51* 6.03* 6.49*  --   CALCIUM 10.3 10.6* 10.7*  --    Liver Function Tests: Recent Labs  Lab 05/29/17 1944  AST 64*  ALT 43  ALKPHOS 152*  BILITOT 1.2  PROT 8.3*  ALBUMIN 4.2   CBC: Recent Labs  Lab 05/26/17 0852 05/29/17 1944 05/30/17 1535  WBC  --  13.4*  --   NEUTROABS  --  12.5*  --   HGB 10.5* 10.3* 8.8*  HCT 31.0* 30.5* 26.0*  MCV  --  84.5  --   PLT  --  170  --    Blood Culture    Component Value Date/Time   SDES BLOOD RIGHT FOREARM 02/20/2017 1715   SPECREQUEST  02/20/2017 1715    BOTTLES DRAWN AEROBIC AND ANAEROBIC Blood Culture adequate volume   CULT NO GROWTH 5 DAYS 02/20/2017 1715   REPTSTATUS 02/25/2017 FINAL 02/20/2017 1715    Studies/Results: Dg Chest Port 1 View  Result Date: 05/30/2017 CLINICAL DATA:  Central line placement EXAM: PORTABLE CHEST 1 VIEW COMPARISON:   Portable exam 1813 hours compared to 05/29/2017 FINDINGS: Large bore dual-lumen RIGHT jugular central venous catheter with tip projecting over RIGHT atrium. Enlargement of cardiac silhouette with pulmonary vascular congestion. Atherosclerotic calcification aorta. Minimal perihilar infiltrates likely pulmonary edema. No gross pleural effusion or pneumothorax. Osseous structures unremarkable. IMPRESSION: No pneumothorax following RIGHT jugular line placement. Enlargement of cardiac silhouette with minimal pulmonary edema. Electronically Signed   By: Lavonia Dana M.D.   On: 05/30/2017 18:50   Dg Chest Portable 1 View  Result Date: 05/29/2017 CLINICAL DATA:  Shortness of Breath EXAM: PORTABLE CHEST 1 VIEW COMPARISON:  05/22/2017 FINDINGS: Cardiac shadow remains enlarged. Aortic calcifications are again seen. The lungs are well aerated bilaterally with mild interstitial changes consistent with early edema. No focal infiltrate or sizable effusion is noted. IMPRESSION: Mild interstitial edema. Electronically Signed   By: Inez Catalina M.D.   On: 05/29/2017 20:29   Dg Fluoro Guide Cv Line-no Report  Result Date: 05/30/2017 Fluoroscopy was utilized by the requesting physician.  No radiographic interpretation.   Medications: . sodium chloride Stopped (05/30/17 1738)  . sodium chloride    . sodium chloride    .  sodium chloride    . sodium chloride     . aspirin EC  81 mg Oral Daily  . [START ON 06/01/2017] calcitRIOL  0.75 mcg Oral Q M,W,F-HD  . clopidogrel  75 mg Oral Daily  . gabapentin  100 mg Oral BID  . heparin  3,000 Units Dialysis Once in dialysis  . heparin  4,000 Units Dialysis Once in dialysis  . nicotine  14 mg Transdermal Daily  . sevelamer carbonate  3,200 mg Oral TID WC  . sodium chloride flush  3 mL Intravenous Q12H     Dialysis: [facility] 4h   72.5kg  2/2  Hep 3000  LUA AVF -mircera 200 ug next 11/29 -venofer load 7 doses left 100 mg -sensipar 120 mg tiw w/ HD -left over edw  last 4 HD (76 - 81kg post) -s/o early about 50% of session -Hb 9.5, tsat 20%, Ca 10.7, P 4, pth 1475   Impression: 1. Acute dyspnea - pulm edema on CXR, vol overload on exam. Last HD was 11/16 essentially.  AVF infiltrated- now with [facility] will need to use for a while. Short HD last night only removed 1400.  BP is lower than I have ever seen it- on minoxidil 10 BID (probably evidence that he does not take at home ) - will lower dose and give parameters  2. LUA AVF edema/blistering:  LUA AVF: edematous with blistering on inner aspect of AVF + bruit.Recent fistulogram per Dr. Bridgett Larsson (05/26/17)-AVF is patent without evidence of central stenosis.   I guess just a bad infiltration, will need to rest for a while and use PC.  VVS had considered a hematoma evac but did not end up doing 3. Hyperkalemia - better with HD  4. ESRD on HD- normally MWF- had Friday and last night- not in mood today, will plan for next tomorrow (Wed) 5. Hepatitis B + - HD in isolation  6. Hx hep C- s/p treatment 7. Hx substance abuse- claims abstinence 8. Anemia- hgb 8.8 after surg- on max mircera due 11/29 9. Bones- rocatrol, renvela and sensipar- all cont from home   Michaelene Dutan A

## 2017-05-31 NOTE — Progress Notes (Signed)
Patient asked for CPAP to be brought to the room, but did not wear at this time. CPAP at bedside.

## 2017-05-31 NOTE — Progress Notes (Signed)
Patient refuses to wear telemetry monitor. He said "I cannot tolerate all those wires hanging off of me after all I have been through." Explained the reason for telemetry monitoring and he still refused to wear portable monitor or be attached to monitor in room. Patient also refused all labs. He said he will let them draw labs in the morning.

## 2017-06-01 ENCOUNTER — Inpatient Hospital Stay (HOSPITAL_COMMUNITY): Payer: Medicare Other | Admitting: Anesthesiology

## 2017-06-01 ENCOUNTER — Encounter (HOSPITAL_COMMUNITY): Payer: Self-pay | Admitting: Certified Registered Nurse Anesthetist

## 2017-06-01 ENCOUNTER — Inpatient Hospital Stay (HOSPITAL_COMMUNITY): Payer: Medicare Other

## 2017-06-01 ENCOUNTER — Encounter (HOSPITAL_COMMUNITY)
Admission: EM | Disposition: A | Discharge status: Other Acute Inpatient Hospital | Payer: Self-pay | Source: Home / Self Care | Attending: Internal Medicine

## 2017-06-01 DIAGNOSIS — I361 Nonrheumatic tricuspid (valve) insufficiency: Secondary | ICD-10-CM

## 2017-06-01 DIAGNOSIS — I97638 Postprocedural hematoma of a circulatory system organ or structure following other circulatory system procedure: Secondary | ICD-10-CM

## 2017-06-01 HISTORY — PX: I & D EXTREMITY: SHX5045

## 2017-06-01 LAB — POCT I-STAT 4, (NA,K, GLUC, HGB,HCT)
Glucose, Bld: 106 mg/dL — ABNORMAL HIGH (ref 65–99)
Glucose, Bld: 99 mg/dL (ref 65–99)
HCT: 17 % — ABNORMAL LOW (ref 39.0–52.0)
HCT: 19 % — ABNORMAL LOW (ref 39.0–52.0)
Hemoglobin: 5.8 g/dL — CL (ref 13.0–17.0)
Hemoglobin: 6.5 g/dL — CL (ref 13.0–17.0)
Potassium: 5.1 mmol/L (ref 3.5–5.1)
Potassium: 5.3 mmol/L — ABNORMAL HIGH (ref 3.5–5.1)
Sodium: 130 mmol/L — ABNORMAL LOW (ref 135–145)
Sodium: 131 mmol/L — ABNORMAL LOW (ref 135–145)

## 2017-06-01 LAB — ECHOCARDIOGRAM COMPLETE
Ao-asc: 33 cm
E decel time: 306 msec
FS: 34 % (ref 28–44)
Height: 69 in
IVS/LV PW RATIO, ED: 0.83
LA ID, A-P, ES: 47 mm
LA diam end sys: 47 mm
LA diam index: 2.43 cm/m2
LA vol A4C: 135 ml
LA vol index: 72.8 mL/m2
LA vol: 141 mL
LV PW d: 18 mm — AB (ref 0.6–1.1)
LV e' LATERAL: 8.38 cm/s
LVOT area: 3.8 cm2
LVOT diameter: 22 mm
Lateral S' vel: 12.2 cm/s
MV Annulus VTI: 55.1 cm
MV Dec: 306
MV M vel: 162
MV pk E vel: 0.9 m/s
Mean grad: 12 mmHg
PV Reg grad dias: 13 mmHg
PV Reg vel dias: 178 cm/s
RV sys press: 95 mmHg
Reg peak vel: 446 cm/s
TAPSE: 22.5 mm
TDI e' lateral: 8.38
TDI e' medial: 5.77
TR max vel: 446 cm/s
Weight: 2694.4 oz

## 2017-06-01 LAB — PREPARE RBC (CROSSMATCH)

## 2017-06-01 LAB — COMPREHENSIVE METABOLIC PANEL
ALT: 29 U/L (ref 17–63)
AST: 36 U/L (ref 15–41)
Albumin: 3.5 g/dL (ref 3.5–5.0)
Alkaline Phosphatase: 82 U/L (ref 38–126)
Anion gap: 12 (ref 5–15)
BUN: 40 mg/dL — ABNORMAL HIGH (ref 6–20)
CO2: 28 mmol/L (ref 22–32)
Calcium: 9.8 mg/dL (ref 8.9–10.3)
Chloride: 87 mmol/L — ABNORMAL LOW (ref 101–111)
Creatinine, Ser: 6.67 mg/dL — ABNORMAL HIGH (ref 0.61–1.24)
GFR calc Af Amer: 10 mL/min — ABNORMAL LOW (ref >60)
GFR calc non Af Amer: 8 mL/min — ABNORMAL LOW (ref >60)
Glucose, Bld: 82 mg/dL (ref 65–99)
Potassium: 5.8 mmol/L — ABNORMAL HIGH (ref 3.5–5.1)
Sodium: 127 mmol/L — ABNORMAL LOW (ref 135–145)
Total Bilirubin: 1.1 mg/dL (ref 0.3–1.2)
Total Protein: 6.4 g/dL — ABNORMAL LOW (ref 6.5–8.1)

## 2017-06-01 LAB — CBC
HCT: 21.9 % — ABNORMAL LOW (ref 39.0–52.0)
Hemoglobin: 7.5 g/dL — ABNORMAL LOW (ref 13.0–17.0)
MCH: 28.5 pg (ref 26.0–34.0)
MCHC: 34.2 g/dL (ref 30.0–36.0)
MCV: 83.3 fL (ref 78.0–100.0)
Platelets: 100 10*3/uL — ABNORMAL LOW (ref 150–400)
RBC: 2.63 MIL/uL — ABNORMAL LOW (ref 4.22–5.81)
RDW: 19.2 % — ABNORMAL HIGH (ref 11.5–15.5)
WBC: 10.1 10*3/uL (ref 4.0–10.5)

## 2017-06-01 LAB — TROPONIN I: Troponin I: 0.23 ng/mL (ref <0.03)

## 2017-06-01 SURGERY — IRRIGATION AND DEBRIDEMENT EXTREMITY
Anesthesia: General | Laterality: Left

## 2017-06-01 MED ORDER — DEXAMETHASONE SODIUM PHOSPHATE 4 MG/ML IJ SOLN
INTRAMUSCULAR | Status: DC | PRN
  Administered 2017-06-01: 5 mg via INTRAVENOUS

## 2017-06-01 MED ORDER — OXYCODONE HCL 5 MG PO TABS
ORAL_TABLET | ORAL | Status: AC
  Filled 2017-06-01: qty 1

## 2017-06-01 MED ORDER — MIDAZOLAM HCL 5 MG/5ML IJ SOLN
INTRAMUSCULAR | Status: DC | PRN
  Administered 2017-06-01: 2 mg via INTRAVENOUS

## 2017-06-01 MED ORDER — FENTANYL CITRATE (PF) 100 MCG/2ML IJ SOLN
INTRAMUSCULAR | Status: AC
  Filled 2017-06-01: qty 2

## 2017-06-01 MED ORDER — SODIUM CHLORIDE 0.9 % IV SOLN: Freq: Once | INTRAVENOUS | Status: DC

## 2017-06-01 MED ORDER — ALBUMIN HUMAN 5 % IV SOLN
INTRAVENOUS | Status: DC | PRN
  Administered 2017-06-01: 19:00:00 via INTRAVENOUS

## 2017-06-01 MED ORDER — PROPOFOL 10 MG/ML IV BOLUS
INTRAVENOUS | Status: AC
  Filled 2017-06-01: qty 20

## 2017-06-01 MED ORDER — MIDAZOLAM HCL 2 MG/2ML IJ SOLN
INTRAMUSCULAR | Status: AC
  Filled 2017-06-01: qty 2

## 2017-06-01 MED ORDER — LIDOCAINE HCL (PF) 1 % IJ SOLN
INTRAMUSCULAR | Status: AC
  Filled 2017-06-01: qty 30

## 2017-06-01 MED ORDER — DEXTROSE 5 % IV SOLN
INTRAVENOUS | Status: DC | PRN
  Administered 2017-06-01: 50 ug/min via INTRAVENOUS

## 2017-06-01 MED ORDER — FENTANYL CITRATE (PF) 100 MCG/2ML IJ SOLN
50.0000 ug | Freq: Once | INTRAMUSCULAR | Status: AC
  Administered 2017-06-01: 50 ug via INTRAVENOUS
  Administered 2017-06-01: 25 ug via INTRAVENOUS
  Administered 2017-06-01: 100 ug via INTRAVENOUS
  Administered 2017-06-01: 25 ug via INTRAVENOUS

## 2017-06-01 MED ORDER — LIDOCAINE HCL (CARDIAC) 20 MG/ML IV SOLN
INTRAVENOUS | Status: DC | PRN
  Administered 2017-06-01: 40 mg via INTRAVENOUS

## 2017-06-01 MED ORDER — ONDANSETRON HCL 4 MG/2ML IJ SOLN: 4.0000 mg | Freq: Once | INTRAMUSCULAR | Status: DC | PRN

## 2017-06-01 MED ORDER — NICOTINE 21 MG/24HR TD PT24
21.0000 mg | MEDICATED_PATCH | Freq: Every day | TRANSDERMAL | Status: DC
  Administered 2017-06-02 – 2017-06-08 (×4): 21 mg via TRANSDERMAL
  Filled 2017-06-01 (×9): qty 1

## 2017-06-01 MED ORDER — FENTANYL CITRATE (PF) 250 MCG/5ML IJ SOLN
INTRAMUSCULAR | Status: AC
  Filled 2017-06-01: qty 5

## 2017-06-01 MED ORDER — SODIUM CHLORIDE 0.9 % IV SOLN
INTRAVENOUS | Status: DC
  Administered 2017-06-01: 17:00:00 via INTRAVENOUS

## 2017-06-01 MED ORDER — SUCCINYLCHOLINE CHLORIDE 20 MG/ML IJ SOLN
INTRAMUSCULAR | Status: DC | PRN
  Administered 2017-06-01: 120 mg via INTRAVENOUS

## 2017-06-01 MED ORDER — FENTANYL CITRATE (PF) 100 MCG/2ML IJ SOLN
25.0000 ug | INTRAMUSCULAR | Status: DC | PRN
  Administered 2017-06-01 (×3): 50 ug via INTRAVENOUS

## 2017-06-01 MED ORDER — MIDAZOLAM HCL 2 MG/2ML IJ SOLN: 2.0000 mg | Freq: Once | INTRAMUSCULAR | Status: DC

## 2017-06-01 MED ORDER — ETOMIDATE 2 MG/ML IV SOLN
INTRAVENOUS | Status: DC | PRN
  Administered 2017-06-01: 12 mg via INTRAVENOUS

## 2017-06-01 MED ORDER — 0.9 % SODIUM CHLORIDE (POUR BTL) OPTIME
TOPICAL | Status: DC | PRN
  Administered 2017-06-01: 1000 mL

## 2017-06-01 SURGICAL SUPPLY — 38 items
BANDAGE ACE 4X5 VEL STRL LF (GAUZE/BANDAGES/DRESSINGS) ×1 IMPLANT
BANDAGE ACE 6X5 VEL STRL LF (GAUZE/BANDAGES/DRESSINGS) ×1 IMPLANT
BNDG GAUZE ELAST 4 BULKY (GAUZE/BANDAGES/DRESSINGS) ×1 IMPLANT
CANISTER SUCT 3000ML PPV (MISCELLANEOUS) ×2 IMPLANT
COVER PROBE W GEL 5X96 (DRAPES) ×1 IMPLANT
COVER SURGICAL LIGHT HANDLE (MISCELLANEOUS) ×2 IMPLANT
DRAPE ORTHO SPLIT 77X108 STRL (DRAPES) ×4
DRAPE SURG ORHT 6 SPLT 77X108 (DRAPES) IMPLANT
ELECT REM PT RETURN 9FT ADLT (ELECTROSURGICAL) ×2
ELECTRODE REM PT RTRN 9FT ADLT (ELECTROSURGICAL) ×1 IMPLANT
GAUZE SPONGE 4X4 12PLY STRL (GAUZE/BANDAGES/DRESSINGS) ×2 IMPLANT
GAUZE XEROFORM 5X9 LF (GAUZE/BANDAGES/DRESSINGS) ×1 IMPLANT
GLOVE BIO SURGEON STRL SZ7.5 (GLOVE) ×2 IMPLANT
GLOVE BIOGEL M 6.5 STRL (GLOVE) ×2 IMPLANT
GLOVE BIOGEL PI IND STRL 6.5 (GLOVE) IMPLANT
GLOVE BIOGEL PI INDICATOR 6.5 (GLOVE) ×3
GOWN STRL REUS W/ TWL LRG LVL3 (GOWN DISPOSABLE) ×3 IMPLANT
GOWN STRL REUS W/TWL LRG LVL3 (GOWN DISPOSABLE) ×6
KIT BASIN OR (CUSTOM PROCEDURE TRAY) ×2 IMPLANT
KIT ROOM TURNOVER OR (KITS) ×2 IMPLANT
LOOP VESSEL MAXI BLUE (MISCELLANEOUS) ×1 IMPLANT
NS IRRIG 1000ML POUR BTL (IV SOLUTION) ×2 IMPLANT
PACK GENERAL/GYN (CUSTOM PROCEDURE TRAY) ×1 IMPLANT
PACK UNIVERSAL I (CUSTOM PROCEDURE TRAY) IMPLANT
PAD ARMBOARD 7.5X6 YLW CONV (MISCELLANEOUS) ×4 IMPLANT
SPONGE LAP 18X18 X RAY DECT (DISPOSABLE) ×2 IMPLANT
STAPLER VISISTAT 35W (STAPLE) IMPLANT
STOCKINETTE 6  STRL (DRAPES) ×1
STOCKINETTE 6 STRL (DRAPES) IMPLANT
SUT ETHILON 3 0 PS 1 (SUTURE) ×8 IMPLANT
SUT PROLENE 5 0 C 1 24 (SUTURE) ×1 IMPLANT
SUT PROLENE 5 0 C 1 36 (SUTURE) ×2 IMPLANT
SUT PROLENE 6 0 C 1 24 (SUTURE) ×1 IMPLANT
SUT VIC AB 2-0 CTX 36 (SUTURE) IMPLANT
SUT VIC AB 3-0 SH 27 (SUTURE)
SUT VIC AB 3-0 SH 27X BRD (SUTURE) IMPLANT
SUT VICRYL 4-0 PS2 18IN ABS (SUTURE) IMPLANT
WATER STERILE IRR 1000ML POUR (IV SOLUTION) ×2 IMPLANT

## 2017-06-01 NOTE — Anesthesia Postprocedure Evaluation (Signed)
Anesthesia Post Note  Patient: [name]  Procedure(s) Performed: IRRIGATION AND DEBRIDEMENT LEFT ARM HEMATOMA WITH LIGATION OF LEFT ARM AV FISTULA (Left )     Patient location during evaluation: PACU Anesthesia Type: General Level of consciousness: awake and alert Pain management: pain level controlled Vital Signs Assessment: post-procedure vital signs reviewed and stable Respiratory status: spontaneous breathing, nonlabored ventilation, respiratory function stable and patient connected to nasal cannula oxygen Cardiovascular status: blood pressure returned to baseline and stable Postop Assessment: no apparent nausea or vomiting Anesthetic complications: no    Last Vitals:  Vitals:   06/01/17 2135 06/01/17 2156  BP: (!) 192/103 (!) 152/102  Pulse: 85 80  Resp: 12 12  Temp: 36.6 C 36.7 C  SpO2: 100% 100%    Last Pain:  Vitals:   06/01/17 2156  TempSrc: Oral  PainSc:                  Effie Berkshire

## 2017-06-01 NOTE — Transfer of Care (Signed)
Immediate Anesthesia Transfer of Care Note  Patient: [name]  Procedure(s) Performed: IRRIGATION AND DEBRIDEMENT EXTREMITY/LEFT ARM (Left )  Patient Location: PACU  Anesthesia Type:General  Level of Consciousness: awake, alert  and oriented  Airway & Oxygen Therapy: Patient Spontanous Breathing and Patient connected to nasal cannula oxygen  Post-op Assessment: Report given to RN and Post -op Vital signs reviewed and stable  Post vital signs: Reviewed and stable  Last Vitals:  Vitals:   06/01/17 1530 06/01/17 2039  BP: 123/78 (!) 185/73  Pulse: 84 81  Resp:  15  Temp:  (!) 36.3 C  SpO2:  100%    Last Pain:  Vitals:   06/01/17 2039  TempSrc:   PainSc: 0-No pain         Complications: No apparent anesthesia complications

## 2017-06-01 NOTE — Progress Notes (Signed)
[facility] Progress Note   Subjective: Seems slightly more calm today.  Wants to change centers or else he will quit  Objective Vitals:   05/31/17 1234 05/31/17 2022 05/31/17 2235 06/01/17 0407  BP: 120/78   (!) 141/83  Pulse: (!) 122     Resp:  20  20  Temp: 97.8 F (36.6 C) (!) 97.5 F (36.4 C)  97.8 F (36.6 C)  TempSrc: Oral Oral  Oral  SpO2: 92% 100%  100%  Weight:   75.5 kg (166 lb 7.2 oz) 76.4 kg (168 lb 6.4 oz)  Height:   5\' 9"  (1.753 m)    Physical Exam General: Awake, alert, NAD-  Heart: S1,S2 RRR Tachy-HR 99-115 Lungs: bilateral breath sounds, slightly decreased in bases, sounds tight with coarse breath sounds Abdomen: active BS SNT Extremities: trace pre-tib edema Dialysis Access: LUA AVF: edematous with blisters, not signif different than exam on 11/19  + bruit. New right sided PC with blood on dressing   Additional Objective Labs: Basic Metabolic Panel: Recent Labs  Lab 05/30/17 0843 05/30/17 1530 05/30/17 1535 06/01/17 0329  NA 130* 132* 131* 127*  K 7.0* 6.2* 6.1* 5.8*  CL 90* 90*  --  87*  CO2 27 30  --  28  GLUCOSE 151* 106* 110* 82  BUN 32* 36*  --  40*  CREATININE 6.03* 6.49*  --  6.67*  CALCIUM 10.6* 10.7*  --  9.8   Liver Function Tests: Recent Labs  Lab 05/29/17 1944 06/01/17 0329  AST 64* 36  ALT 43 29  ALKPHOS 152* 82  BILITOT 1.2 1.1  PROT 8.3* 6.4*  ALBUMIN 4.2 3.5   CBC: Recent Labs  Lab 05/29/17 1944 05/30/17 1535 06/01/17 0329  WBC 13.4*  --  10.1  NEUTROABS 12.5*  --   --   HGB 10.3* 8.8* 7.5*  HCT 30.5* 26.0* 21.9*  MCV 84.5  --  83.3  PLT 170  --  100*   Blood Culture    Component Value Date/Time   SDES BLOOD RIGHT FOREARM 02/20/2017 1715   SPECREQUEST  02/20/2017 1715    BOTTLES DRAWN AEROBIC AND ANAEROBIC Blood Culture adequate volume   CULT NO GROWTH 5 DAYS 02/20/2017 1715   REPTSTATUS 02/25/2017 FINAL 02/20/2017 1715    Studies/Results: Dg Chest Port 1 View  Result Date:  05/30/2017 CLINICAL DATA:  Central line placement EXAM: PORTABLE CHEST 1 VIEW COMPARISON:  Portable exam 1813 hours compared to 05/29/2017 FINDINGS: Large bore dual-lumen RIGHT jugular central venous catheter with tip projecting over RIGHT atrium. Enlargement of cardiac silhouette with pulmonary vascular congestion. Atherosclerotic calcification aorta. Minimal perihilar infiltrates likely pulmonary edema. No gross pleural effusion or pneumothorax. Osseous structures unremarkable. IMPRESSION: No pneumothorax following RIGHT jugular line placement. Enlargement of cardiac silhouette with minimal pulmonary edema. Electronically Signed   By: Lavonia Dana M.D.   On: 05/30/2017 18:50   Dg Fluoro Guide Cv Line-no Report  Result Date: 05/30/2017 Fluoroscopy was utilized by the requesting physician.  No radiographic interpretation.   Medications: . sodium chloride Stopped (05/30/17 1738)  . sodium chloride    . sodium chloride    . sodium chloride    . sodium chloride     . aspirin EC  81 mg Oral Daily  . calcitRIOL  0.75 mcg Oral Q M,W,F-HD  . cinacalcet  120 mg Oral Q M,W,F-HD  . clopidogrel  75 mg Oral Daily  . gabapentin  100 mg Oral BID  . heparin  3,000  Units Dialysis Once in dialysis  . heparin  4,000 Units Dialysis Once in dialysis  . [START ON 06/02/2017] nicotine  21 mg Transdermal Daily  . sevelamer carbonate  3,200 mg Oral TID WC  . sodium chloride flush  3 mL Intravenous Q12H  . zolpidem  5 mg Oral Once     Dialysis: [facility] 4h   72.5kg  2/2  Hep 3000  LUA AVF -mircera 200 ug next 11/29 -venofer load 7 doses left 100 mg -sensipar 120 mg tiw w/ HD -left over edw last 4 HD (76 - 81kg post) -s/o early about 50% of session -Hb 9.5, tsat 20%, Ca 10.7, P 4, pth 1475   Impression: 1. Acute dyspnea - pulm edema on CXR, vol overload on exam. Last OP HD was 11/16 - then had short tx on 11/19  AVF infiltrated- now with [facility] will need to use for a while. .  BP is lower than I have  ever seen it- was on minoxidil 10 BID (probably evidence that he does not take at home ) - now on 5 BID with parameters 2. LUA AVF edema/blistering:  LUA AVF: edematous with blistering on inner aspect of AVF + bruit.Recent fistulogram per Dr. Bridgett Larsson (05/26/17)-AVF is patent without evidence of central stenosis.   I guess just a bad infiltration, will need to rest for a while and use PC.  VVS had considered a hematoma evac but did not end up doing- per VVS- do they need to do more intervention 3. Hyperkalemia - better with HD- is due today   4. ESRD on HD- normally MWF- had Friday and Mon, will plan for next today, then Fri to keep on schedule.  Wants to change centers 5. Hepatitis B + - HD in isolation  6. Hx hep C- s/p treatment 7. Hx substance abuse- claims abstinence 8. Anemia- hgb 8.8>>7.5 after surg- on max mircera due 11/29- give darbe here 9. Bones- rocatrol, renvela and sensipar- all cont from home- phos 9.8 10. Dispo- will work on changing centers- may keep him here til Friday, also still on IV pain meds and not sure if VVS is considering a hematoma evac as well ?    Levin Dagostino A

## 2017-06-01 NOTE — Anesthesia Procedure Notes (Addendum)
Procedure Name: Intubation Date/Time: 06/01/2017 6:34 PM Performed by: Asta Corbridge T, CRNA Pre-anesthesia Checklist: Patient identified, Emergency Drugs available, Suction available and Patient being monitored Patient Re-evaluated:Patient Re-evaluated prior to induction Oxygen Delivery Method: Circle system utilized Preoxygenation: Pre-oxygenation with 100% oxygen Induction Type: IV induction, Rapid sequence and Cricoid Pressure applied Laryngoscope Size: Miller and 3 Grade View: Grade I Tube type: Oral Tube size: 7.5 mm Number of attempts: 1 Airway Equipment and Method: Patient positioned with wedge pillow and Stylet Placement Confirmation: ETT inserted through vocal cords under direct vision,  positive ETCO2 and breath sounds checked- equal and bilateral Secured at: 23 cm Tube secured with: Tape Dental Injury: Teeth and Oropharynx as per pre-operative assessment

## 2017-06-01 NOTE — Progress Notes (Signed)
Left arm more fluctuant today Still with pain More skin slough Will take to OR today for I and D  Ruta Hinds, MD [facility] [place]: [phone] Pager: [phone]

## 2017-06-01 NOTE — Anesthesia Preprocedure Evaluation (Addendum)
Anesthesia Evaluation  Patient identified by MRN, date of birth, ID band Patient awake    Reviewed: Allergy & Precautions, NPO status , Patient's Chart, lab work & pertinent test results  Airway Mallampati: II  TM Distance: >3 FB Neck ROM: Full    Dental  (+) Teeth Intact, Dental Advisory Given   Pulmonary COPD, Current Smoker,    breath sounds clear to auscultation       Cardiovascular hypertension, + CAD, + Past MI (02/2017), + Cardiac Stents and + Peripheral Vascular Disease  + Valvular Problems/Murmurs (Severe TR)  Rhythm:Regular Rate:Normal  TTE 2018 - Moderate LVH. Mild MR. Moderately dilated LA, RA, and RV. Severe TR. Severe Pulm HTN with PASP 44mmHg. Small posterior pericardial effusion  Cath 02/2017 - NSTEMI. Severe single vessel coronary artery disease with high grade prox RCA stenosis. Successful PTCA and stent placement Xience Alpine 3.5 X 23 mm. 0% residual stenosis. TIMI III flow.   Neuro/Psych PSYCHIATRIC DISORDERS Anxiety negative neurological ROS     GI/Hepatic GERD  ,(+)     substance abuse  cocaine use, Hepatitis -, B, C  Endo/Other  negative endocrine ROS  Renal/GU ESRF and DialysisRenal disease     Musculoskeletal  (+) Arthritis ,   Abdominal   Peds  Hematology  (+) anemia , Thrombocytopenia   Anesthesia Other Findings Hyponatremia, hyperkalemia  Reproductive/Obstetrics                            Lab Results  Component Value Date   WBC 10.1 06/01/2017   HGB 7.5 (L) 06/01/2017   HCT 21.9 (L) 06/01/2017   MCV 83.3 06/01/2017   PLT 100 (L) 06/01/2017   Lab Results  Component Value Date   CREATININE 6.67 (H) 06/01/2017   BUN 40 (H) 06/01/2017   NA 127 (L) 06/01/2017   K 5.8 (H) 06/01/2017   CL 87 (L) 06/01/2017   CO2 28 06/01/2017     Anesthesia Physical  Anesthesia Plan  ASA: III  Anesthesia Plan: General   Post-op Pain Management:    Induction:  Intravenous  PONV Risk Score and Plan: 3 and Ondansetron, Treatment may vary due to age or medical condition, Midazolam and Dexamethasone  Airway Management Planned: LMA  Additional Equipment: None  Intra-op Plan:   Post-operative Plan: Extubation in OR  Informed Consent: I have reviewed the patients History and Physical, chart, labs and discussed the procedure including the risks, benefits and alternatives for the proposed anesthesia with the patient or authorized representative who has indicated his/her understanding and acceptance.   Dental advisory given  Plan Discussed with: CRNA  Anesthesia Plan Comments: (Risks of GA discussed in detail, emphasis placed on Pulm HTN. Pt anatomy not amenable to PNB due to vascularity per Dr. Fransisco Beau. I agree with assessment. )       Anesthesia Quick Evaluation

## 2017-06-01 NOTE — Op Note (Addendum)
Procedure: Incision and drainage of left arm hematoma with ligation of left arm AV fistula  Preoperative diagnosis: Hematoma left arm  Postoperative diagnosis: Same  Anesthesia: Gen.  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 The patient was placed in supine position upper and table. After induction of general anesthesia and endotracheal intubation, the patient's entire left upper extremity was prepped and draped in usual sterile fashion. Ultrasound was used to identify the largest concentration of hematoma in the left arm. A longitudinal incision was made over the medial aspect of the left arm. Upon opening the incision a large amount hematoma contents was evacuated. There was then bright red arterial bleeding. This required me to extend the incision more proximally toward the arterial anastomosis. There was a large hole in the fistula which was actively bleeding. During the portion of the case while we were establishing vascular control the patient lost about 700 cc of blood. The fistula was dissected free circumferentially a few centimeters above the arterial anastomosis. I was able to get a fistula clamp across it at this level and control the bleeding. No additional fistula clamp was placed distal on the fistula. 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 It was transected. The proximal and distal ends of the fistula with an oversewn with a running 5-0 Prolene suture. Clamps were removed and there was good hemostasis. One additional repair stitch was placed on the proximal end. The wound was thoroughly irrigated with normal saline solution. There was some diffuse oozing and most of this was controlled with cautery. The skin had been very thinned out and compromised with several areas of skin  necrosis. In order to get good cramp ridge over the brachial artery. Large bites of vertical mattress 3-0 nylon sutures were placed. I then alternated vertical mattress and simple nylon sutures to close the remainder of the incision of the arm. Patient had a palpable radial pulse at the conclusion of the case. A Xeroform dressing was placed over the incision. Kerlix and an Ace wrap was placed over this. The patient our the procedure well and there were no complications. The incision and sponge and needle count was correct at the end of the case. The patient was taken to recovery room in stable condition.  Ruta Hinds, MD [facility] [place] Office: [phone] Pager: [phone]

## 2017-06-01 NOTE — Progress Notes (Signed)
  Echocardiogram 2D Echocardiogram has been performed.  [name] G [name] 06/01/2017, 11:29 AM

## 2017-06-01 NOTE — Procedures (Signed)
Patient was seen on dialysis and the procedure was supervised.  BFR 400  Via PC BP is  123/78.   Patient appears to be tolerating treatment well- needs to come off to go to OR - pre HD K was 5.8- received almoost 2 hours of treatment   Avin Upperman A 06/01/2017

## 2017-06-01 NOTE — Progress Notes (Signed)
Received pt back from PACU. L arm pain 3/10. VSS. Still refuses to wear tele, gown, pulse ox, etc. C/o itching, hydrocortisone cream given to pt with relief. Denies any other needs at this time. Will continue to monitor.

## 2017-06-01 NOTE — Progress Notes (Signed)
PROGRESS NOTE    [name]  NWG:[id number] DOB: 06-15-1961 DOA: 05/29/2017 PCP: [name], MD     Brief Narrative:  [name] is a 56 yo male with past medical history of hypertension, cocaine abuse, hepatitis B/C, COPD, chronic systolic heart failure, ESRD on dialysis who presented with sudden swelling in LUE. He had a left brachiocephalic AVF shuntogram on 05/26/2017 which noted a patent fistula with >57mm small pseudoaneurysm proximally and moderate bilobe pseudoaneurysm in the mid segment and moderate sized broad pseudoaneurysm distally. He had a tunneled dialysis catheter placed by vascular surgery on 11/19.  Assessment & Plan:   Principal Problem:   Dyspnea Active Problems:   Chronic hepatitis C without hepatic coma (HCC)   Essential hypertension   COPD (chronic obstructive pulmonary disease) (HCC)   Hyperkalemia   Tachycardia  Acute pulmonary edema -Now room air. Resolved  Mildly elevated troponin  -Peak 0.35 likely secondary to demand ischemia -Had left heart cath with stenting high-grade prox RCA stenosis 02/22/17 and plavix for 6 months  LUE AVF infiltration -Vascular surgery following  Hx A Fib/flutter with aberrancy -S/p DCCV 03/24/17. Coumadin discontinued as patient noncompliant  ESRD on HD  -Nephrology following  Hepatitis B and C Substance abuse    DVT prophylaxis: SCD Code Status: Full Family Communication: No family at bedside Disposition Plan: Pending improvement    Consultants:   Nephrology  Vascular surgery  Antimicrobials:  Anti-infectives (From admission, onward)   Start     Dose/Rate Route Frequency Ordered Stop   05/30/17 1530  cefUROXime (ZINACEF) 1.5 g in dextrose 5 % 50 mL IVPB     1.5 g 100 mL/hr over 30 Minutes Intravenous To ShortStay Surgical 05/30/17 1445 05/30/17 1721   05/30/17 1015  ceFAZolin (ANCEF) IVPB 2g/100 mL premix  Status:  Discontinued     2 g 200 mL/hr over 30 Minutes Intravenous To  Radiology 05/30/17 1014 05/30/17 1458       Subjective: No new complaints today, no acute events overnight.  States that he feels terrible but unable to specify any specific complaints  Objective: Vitals:   05/31/17 1234 05/31/17 2022 05/31/17 2235 06/01/17 0407  BP: 120/78   (!) 141/83  Pulse: (!) 122     Resp:  20  20  Temp: 97.8 F (36.6 C) (!) 97.5 F (36.4 C)  97.8 F (36.6 C)  TempSrc: Oral Oral  Oral  SpO2: 92% 100%  100%  Weight:   75.5 kg (166 lb 7.2 oz) 76.4 kg (168 lb 6.4 oz)  Height:   5\' 9"  (1.753 m)     Intake/Output Summary (Last 24 hours) at 06/01/2017 1328 Last data filed at 06/01/2017 0900 Gross per 24 hour  Intake 480 ml  Output -  Net 480 ml   Filed Weights   05/31/17 0438 05/31/17 2235 06/01/17 0407  Weight: 75.5 kg (166 lb 7.2 oz) 75.5 kg (166 lb 7.2 oz) 76.4 kg (168 lb 6.4 oz)    Examination:  General exam: Appears calm and comfortable  Respiratory system: Clear to auscultation. Respiratory effort normal. Cardiovascular system: S1 & S2 heard, RRR. No JVD, murmurs, rubs, gallops or clicks. Trace pedal edema. Gastrointestinal system: Abdomen is nondistended, soft and nontender. No organomegaly or masses felt. Normal bowel sounds heard. Central nervous system: Alert and oriented. No focal neurological deficits. Extremities: Symmetric, +LUE with ACE bandage  Psychiatry: Judgement and insight appear poor  Data Reviewed: I have personally reviewed following labs and imaging studies  CBC:  Recent Labs  Lab 05/26/17 0852 05/29/17 1944 05/30/17 1535 06/01/17 0329  WBC  --  13.4*  --  10.1  NEUTROABS  --  12.5*  --   --   HGB 10.5* 10.3* 8.8* 7.5*  HCT 31.0* 30.5* 26.0* 21.9*  MCV  --  84.5  --  83.3  PLT  --  170  --  144*   Basic Metabolic Panel: Recent Labs  Lab 05/26/17 0852 05/29/17 1944 05/30/17 0843 05/30/17 1530 05/30/17 1535 06/01/17 0329  NA 129* 130* 130* 132* 131* 127*  K 5.3* 5.8* 7.0* 6.2* 6.1* 5.8*  CL 89* 89* 90* 90*   --  87*  CO2  --  26 27 30   --  28  GLUCOSE 116* 353* 151* 106* 110* 82  BUN 27* 24* 32* 36*  --  40*  CREATININE 5.40* 5.51* 6.03* 6.49*  --  6.67*  CALCIUM  --  10.3 10.6* 10.7*  --  9.8   GFR: Estimated Creatinine Clearance: 12.7 mL/min (A) (by C-G formula based on SCr of 6.67 mg/dL (H)). Liver Function Tests: Recent Labs  Lab 05/29/17 1944 06/01/17 0329  AST 64* 36  ALT 43 29  ALKPHOS 152* 82  BILITOT 1.2 1.1  PROT 8.3* 6.4*  ALBUMIN 4.2 3.5   No results for input(s): LIPASE, AMYLASE in the last 168 hours. No results for input(s): AMMONIA in the last 168 hours. Coagulation Profile: No results for input(s): INR, PROTIME in the last 168 hours. Cardiac Enzymes: Recent Labs  Lab 05/31/17 1211 05/31/17 1748 06/01/17 0329  TROPONINI 0.35* 0.27* 0.23*   BNP (last 3 results) No results for input(s): PROBNP in the last 8760 hours. HbA1C: No results for input(s): HGBA1C in the last 72 hours. CBG: No results for input(s): GLUCAP in the last 168 hours. Lipid Profile: No results for input(s): CHOL, HDL, LDLCALC, TRIG, CHOLHDL, LDLDIRECT in the last 72 hours. Thyroid Function Tests: No results for input(s): TSH, T4TOTAL, FREET4, T3FREE, THYROIDAB in the last 72 hours. Anemia Panel: No results for input(s): VITAMINB12, FOLATE, FERRITIN, TIBC, IRON, RETICCTPCT in the last 72 hours. Sepsis Labs: No results for input(s): PROCALCITON, LATICACIDVEN in the last 168 hours.  No results found for this or any previous visit (from the past 240 hour(s)).     Radiology Studies: Dg Chest Port 1 View  Result Date: 05/30/2017 CLINICAL DATA:  Central line placement EXAM: PORTABLE CHEST 1 VIEW COMPARISON:  Portable exam 1813 hours compared to 05/29/2017 FINDINGS: Large bore dual-lumen RIGHT jugular central venous catheter with tip projecting over RIGHT atrium. Enlargement of cardiac silhouette with pulmonary vascular congestion. Atherosclerotic calcification aorta. Minimal perihilar  infiltrates likely pulmonary edema. No gross pleural effusion or pneumothorax. Osseous structures unremarkable. IMPRESSION: No pneumothorax following RIGHT jugular line placement. Enlargement of cardiac silhouette with minimal pulmonary edema. Electronically Signed   By: Lavonia Dana M.D.   On: 05/30/2017 18:50   Dg Fluoro Guide Cv Line-no Report  Result Date: 05/30/2017 Fluoroscopy was utilized by the requesting physician.  No radiographic interpretation.      Scheduled Meds: . calcitRIOL  0.75 mcg Oral Q M,W,F-HD  . cinacalcet  120 mg Oral Q M,W,F-HD  . clopidogrel  75 mg Oral Daily  . gabapentin  100 mg Oral BID  . heparin  3,000 Units Dialysis Once in dialysis  . heparin  4,000 Units Dialysis Once in dialysis  . [START ON 06/02/2017] nicotine  21 mg Transdermal Daily  . sevelamer carbonate  3,200 mg Oral TID  WC  . sodium chloride flush  3 mL Intravenous Q12H  . zolpidem  5 mg Oral Once   Continuous Infusions: . sodium chloride Stopped (05/30/17 1738)  . sodium chloride    . sodium chloride    . sodium chloride    . sodium chloride       LOS: 3 days    Time spent: 40 minutes   Dessa Phi, DO [facility] [email or web address] Password [email or web address] 06/01/2017, 1:28 PM

## 2017-06-02 ENCOUNTER — Encounter (HOSPITAL_COMMUNITY): Payer: Self-pay | Admitting: Vascular Surgery

## 2017-06-02 LAB — BASIC METABOLIC PANEL
Anion gap: 12 (ref 5–15)
BUN: 38 mg/dL — ABNORMAL HIGH (ref 6–20)
CO2: 27 mmol/L (ref 22–32)
Calcium: 8.9 mg/dL (ref 8.9–10.3)
Chloride: 88 mmol/L — ABNORMAL LOW (ref 101–111)
Creatinine, Ser: 6.36 mg/dL — ABNORMAL HIGH (ref 0.61–1.24)
GFR calc Af Amer: 10 mL/min — ABNORMAL LOW (ref >60)
GFR calc non Af Amer: 9 mL/min — ABNORMAL LOW (ref >60)
Glucose, Bld: 94 mg/dL (ref 65–99)
Potassium: 5.4 mmol/L — ABNORMAL HIGH (ref 3.5–5.1)
Sodium: 127 mmol/L — ABNORMAL LOW (ref 135–145)

## 2017-06-02 LAB — CBC
HCT: 24.1 % — ABNORMAL LOW (ref 39.0–52.0)
Hemoglobin: 8.5 g/dL — ABNORMAL LOW (ref 13.0–17.0)
MCH: 29.6 pg (ref 26.0–34.0)
MCHC: 35.3 g/dL (ref 30.0–36.0)
MCV: 84 fL (ref 78.0–100.0)
Platelets: 61 10*3/uL — ABNORMAL LOW (ref 150–400)
RBC: 2.87 MIL/uL — ABNORMAL LOW (ref 4.22–5.81)
RDW: 17.9 % — ABNORMAL HIGH (ref 11.5–15.5)
WBC: 12.9 10*3/uL — ABNORMAL HIGH (ref 4.0–10.5)

## 2017-06-02 LAB — PROTIME-INR
INR: 1.14
Prothrombin Time: 14.5 seconds (ref 11.4–15.2)

## 2017-06-02 MED ORDER — HYDROXYZINE HCL 25 MG PO TABS
25.0000 mg | ORAL_TABLET | Freq: Four times a day (QID) | ORAL | Status: DC | PRN
  Administered 2017-06-02 – 2017-06-05 (×6): 25 mg via ORAL
  Filled 2017-06-02 (×6): qty 1

## 2017-06-02 MED ORDER — DARBEPOETIN ALFA 150 MCG/0.3ML IJ SOSY
150.0000 ug | PREFILLED_SYRINGE | INTRAMUSCULAR | Status: DC
  Administered 2017-06-03 – 2017-06-10 (×2): 150 ug via INTRAVENOUS
  Filled 2017-06-02: qty 0.3

## 2017-06-02 MED ORDER — ZOLPIDEM TARTRATE 5 MG PO TABS
10.0000 mg | ORAL_TABLET | Freq: Every evening | ORAL | Status: DC | PRN
  Administered 2017-06-02 – 2017-06-09 (×8): 10 mg via ORAL
  Filled 2017-06-02 (×8): qty 2

## 2017-06-02 MED ORDER — FENTANYL CITRATE (PF) 100 MCG/2ML IJ SOLN
25.0000 ug | INTRAMUSCULAR | Status: DC | PRN
  Administered 2017-06-02 – 2017-06-04 (×7): 25 ug via INTRAVENOUS
  Filled 2017-06-02 (×6): qty 2

## 2017-06-02 MED ORDER — OXYCODONE-ACETAMINOPHEN 5-325 MG PO TABS
1.0000 | ORAL_TABLET | ORAL | Status: DC | PRN
  Administered 2017-06-02 – 2017-06-04 (×6): 2 via ORAL
  Filled 2017-06-02 (×7): qty 2

## 2017-06-02 NOTE — Progress Notes (Addendum)
PROGRESS NOTE    BUD KAESER  UTM:[id number] DOB: 1961-02-26 DOA: 05/29/2017 PCP: [name], MD     Brief Narrative:  [name] is a 56 yo male with past medical history of hypertension, cocaine abuse, hepatitis B/C, COPD, chronic systolic heart failure, ESRD on dialysis who presented with sudden swelling in LUE. He had a left brachiocephalic AVF shuntogram on 05/26/2017 which noted a patent fistula with >32mm small pseudoaneurysm proximally and moderate bilobe pseudoaneurysm in the mid segment and moderate sized broad pseudoaneurysm distally. He had a tunneled dialysis catheter placed by vascular surgery on 11/19.  He was taken to the OR on 11/21 for evacuation of hematoma of the left arm.  Assessment & Plan:   Principal Problem:   Dyspnea Active Problems:   Chronic hepatitis C without hepatic coma (HCC)   Essential hypertension   COPD (chronic obstructive pulmonary disease) (HCC)   Hyperkalemia   Tachycardia  Acute pulmonary edema -Now room air. Resolved  LUE AVF infiltration -Vascular surgery following -S/p evacuation left arm hematoma with ligation left arm AV fistula 11/21. Received 4u pRBC transfusion. Trend CBC   Mildly elevated troponin  -Peak 0.35 likely secondary to demand ischemia -Had left heart cath with stenting high-grade prox RCA stenosis 02/22/17 and plavix for 6 months  Hx A Fib/flutter with aberrancy -S/p DCCV 03/24/17. Coumadin discontinued as patient noncompliant  ESRD on HD  -Nephrology following  Hepatitis B and C Substance abuse    DVT prophylaxis: SCD Code Status: Full Family Communication: No family at bedside Disposition Plan: Pending improvement    Consultants:   Nephrology  Vascular surgery  Antimicrobials:  Anti-infectives (From admission, onward)   Start     Dose/Rate Route Frequency Ordered Stop   05/30/17 1530  cefUROXime (ZINACEF) 1.5 g in dextrose 5 % 50 mL IVPB     1.5 g 100 mL/hr over 30 Minutes  Intravenous To ShortStay Surgical 05/30/17 1445 05/30/17 1721   05/30/17 1015  ceFAZolin (ANCEF) IVPB 2g/100 mL premix  Status:  Discontinued     2 g 200 mL/hr over 30 Minutes Intravenous To Radiology 05/30/17 1014 05/30/17 1458       Subjective: Multiple complaints today.  He states that he did not get any sleep last night.  Complaining of needing dressing change.  Denies any shortness of breath or chest pain.  Has itching under the dressing   Objective: Vitals:   06/01/17 2125 06/01/17 2135 06/01/17 2156 06/02/17 0500  BP: (!) 193/97 (!) 192/103 (!) 152/102 (!) 147/88  Pulse: 89 85 80   Resp: 16 12 12    Temp:  97.9 F (36.6 C) 98 F (36.7 C) (!) 96.5 F (35.8 C)  TempSrc:  Temporal Oral Axillary  SpO2: 100% 100% 100%   Weight:    78 kg (172 lb)  Height:        Intake/Output Summary (Last 24 hours) at 06/02/2017 1114 Last data filed at 06/02/2017 0900 Gross per 24 hour  Intake 3500 ml  Output 752 ml  Net 2748 ml   Filed Weights   06/01/17 0407 06/01/17 1415 06/02/17 0500  Weight: 76.4 kg (168 lb 6.4 oz) 80 kg (176 lb 5.9 oz) 78 kg (172 lb)    Examination:  General exam: Appears calm and comfortable  Respiratory system: Clear to auscultation. +rhonchi left base. Respiratory effort normal. On room air  Cardiovascular system: S1 & S2 heard, RRR. No JVD, murmurs, rubs, gallops or clicks. Trace pedal edema. Gastrointestinal system: Abdomen is  nondistended, soft and nontender. No organomegaly or masses felt. Normal bowel sounds heard. Central nervous system: Alert and oriented. No focal neurological deficits. Extremities: Symmetric, +LUE with ACE bandaging, with blood  Psychiatry: Judgement and insight appear stable   Data Reviewed: I have personally reviewed following labs and imaging studies  CBC: Recent Labs  Lab 05/29/17 1944 05/30/17 1535 06/01/17 0329 06/01/17 1927 06/01/17 2023 06/02/17 0906  WBC 13.4*  --  10.1  --   --  12.9*  NEUTROABS 12.5*  --   --    --   --   --   HGB 10.3* 8.8* 7.5* 5.8* 6.5* 8.5*  HCT 30.5* 26.0* 21.9* 17.0* 19.0* 24.1*  MCV 84.5  --  83.3  --   --  84.0  PLT 170  --  100*  --   --  61*   Basic Metabolic Panel: Recent Labs  Lab 05/29/17 1944 05/30/17 0843 05/30/17 1530 05/30/17 1535 06/01/17 0329 06/01/17 1927 06/01/17 2023 06/02/17 0906  NA 130* 130* 132* 131* 127* 130* 131* 127*  K 5.8* 7.0* 6.2* 6.1* 5.8* 5.1 5.3* 5.4*  CL 89* 90* 90*  --  87*  --   --  88*  CO2 26 27 30   --  28  --   --  27  GLUCOSE 353* 151* 106* 110* 82 106* 99 94  BUN 24* 32* 36*  --  40*  --   --  38*  CREATININE 5.51* 6.03* 6.49*  --  6.67*  --   --  6.36*  CALCIUM 10.3 10.6* 10.7*  --  9.8  --   --  8.9   GFR: Estimated Creatinine Clearance: 13.3 mL/min (A) (by C-G formula based on SCr of 6.36 mg/dL (H)). Liver Function Tests: Recent Labs  Lab 05/29/17 1944 06/01/17 0329  AST 64* 36  ALT 43 29  ALKPHOS 152* 82  BILITOT 1.2 1.1  PROT 8.3* 6.4*  ALBUMIN 4.2 3.5   No results for input(s): LIPASE, AMYLASE in the last 168 hours. No results for input(s): AMMONIA in the last 168 hours. Coagulation Profile: Recent Labs  Lab 06/02/17 0906  INR 1.14   Cardiac Enzymes: Recent Labs  Lab 05/31/17 1211 05/31/17 1748 06/01/17 0329  TROPONINI 0.35* 0.27* 0.23*   BNP (last 3 results) No results for input(s): PROBNP in the last 8760 hours. HbA1C: No results for input(s): HGBA1C in the last 72 hours. CBG: No results for input(s): GLUCAP in the last 168 hours. Lipid Profile: No results for input(s): CHOL, HDL, LDLCALC, TRIG, CHOLHDL, LDLDIRECT in the last 72 hours. Thyroid Function Tests: No results for input(s): TSH, T4TOTAL, FREET4, T3FREE, THYROIDAB in the last 72 hours. Anemia Panel: No results for input(s): VITAMINB12, FOLATE, FERRITIN, TIBC, IRON, RETICCTPCT in the last 72 hours. Sepsis Labs: No results for input(s): PROCALCITON, LATICACIDVEN in the last 168 hours.  No results found for this or any previous  visit (from the past 240 hour(s)).     Radiology Studies: No results found.    Scheduled Meds: . calcitRIOL  0.75 mcg Oral Q M,W,F-HD  . cinacalcet  120 mg Oral Q M,W,F-HD  . [START ON 06/03/2017] darbepoetin (ARANESP) injection - DIALYSIS  150 mcg Intravenous Q Fri-HD  . gabapentin  100 mg Oral BID  . midazolam  2 mg Intravenous Once  . nicotine  21 mg Transdermal Daily  . sevelamer carbonate  3,200 mg Oral TID WC  . sodium chloride flush  3 mL Intravenous Q12H  . zolpidem  5 mg Oral Once   Continuous Infusions: . sodium chloride 0 mL/hr at 05/30/17 1738  . sodium chloride    . sodium chloride    . sodium chloride 10 mL/hr at 06/01/17 1715  . sodium chloride    . sodium chloride    . sodium chloride       LOS: 4 days    Time spent: 30 minutes   Dessa Phi, DO [facility] [email or web address] Password [email or web address] 06/02/2017, 11:14 AM

## 2017-06-02 NOTE — H&P (View-Only) (Signed)
Re-checked pt's left arm-ace bandage is dry.  Palpable left radial pulse.  Will hold off on OR at this time and advance pt's diet.     Leontine Locket, [facility] 06/02/2017 12:29 PM

## 2017-06-02 NOTE — Progress Notes (Addendum)
Pt c/o generalized itching, beginning to create skin tears d/t his itching. Hydrocortisone cream given with relief, but encouraged pt to speak with MD in AM about medication options

## 2017-06-02 NOTE — Progress Notes (Signed)
Called to pt's room for bleeding from incision.  Pressure held and Dr. Scot Dock called.  Evaluated by Dr.  Scot Dock.  Will place pressure dressing on arm and schedule for the OR later today since the pt last ate around 0830.    Labs ordered for 0500 not done.  CBC, BMP, and INR ordered stat.   Leontine Locket, [facility] 06/02/2017 9:06 AM

## 2017-06-02 NOTE — Progress Notes (Signed)
[facility] Progress Note   Subjective: s/p I and D of AVF arm with evacuation of hematoma- I think ligation of AVF- he feels better.  HD got cut short yest as Diaz result of needing to go to OR - he is bleeding thru all bandages- hgb 5.8 this AM "I want to sleep "   Objective Vitals:   06/01/17 2125 06/01/17 2135 06/01/17 2156 06/02/17 0500  BP: (!) 193/97 (!) 192/103 (!) 152/102 (!) 147/88  Pulse: 89 85 80   Resp: 16 12 12    Temp:  97.9 F (36.6 C) 98 F (36.7 C) (!) 96.5 F (35.8 C)  TempSrc:  Temporal Oral Axillary  SpO2: 100% 100% 100%   Weight:    78 kg (172 lb)  Height:       Physical Exam General: Awake, alert, NAD-  Heart: S1,S2 RRR Tachy-HR 99-115 Lungs: bilateral breath sounds, slightly decreased in bases, sounds tight with coarse breath sounds Abdomen: active BS SNT Extremities: trace pre-tib edema Dialysis Access: LUA AVF: oozing blood  - no  bruit. New right sided PC with clean dressing  Additional Objective Labs: Basic Metabolic Panel: Recent Labs  Lab 05/30/17 0843 05/30/17 1530  06/01/17 0329 06/01/17 1927 06/01/17 2023  NA 130* 132*   < > 127* 130* 131*  K 7.0* 6.2*   < > 5.8* 5.1 5.3*  CL 90* 90*  --  87*  --   --   CO2 27 30  --  28  --   --   GLUCOSE 151* 106*   < > 82 106* 99  BUN 32* 36*  --  40*  --   --   CREATININE 6.03* 6.49*  --  6.67*  --   --   CALCIUM 10.6* 10.7*  --  9.8  --   --    < > = values in this interval not displayed.   Liver Function Tests: Recent Labs  Lab 05/29/17 1944 06/01/17 0329  AST 64* 36  ALT 43 29  ALKPHOS 152* 82  BILITOT 1.2 1.1  PROT 8.3* 6.4*  ALBUMIN 4.2 3.5   CBC: Recent Labs  Lab 05/29/17 1944  06/01/17 0329 06/01/17 1927 06/01/17 2023  WBC 13.4*  --  10.1  --   --   NEUTROABS 12.5*  --   --   --   --   HGB 10.3*   < > 7.5* 5.8* 6.5*  HCT 30.5*   < > 21.9* 17.0* 19.0*  MCV 84.5  --  83.3  --   --   PLT 170  --  100*  --   --    < > = values in this interval not displayed.    Blood Culture    Component Value Date/Time   SDES BLOOD RIGHT FOREARM 02/20/2017 1715   SPECREQUEST  02/20/2017 1715    BOTTLES DRAWN AEROBIC AND ANAEROBIC Blood Culture adequate volume   CULT NO GROWTH 5 DAYS 02/20/2017 1715   REPTSTATUS 02/25/2017 FINAL 02/20/2017 1715    Studies/Results: No results found. Medications: . sodium chloride 0 mL/hr at 05/30/17 1738  . sodium chloride    . sodium chloride    . sodium chloride 10 mL/hr at 06/01/17 1715  . sodium chloride    . sodium chloride    . sodium chloride     . calcitRIOL  0.75 mcg Oral Q M,W,F-HD  . cinacalcet  120 mg Oral Q M,W,F-HD  . fentaNYL      .  fentaNYL      . gabapentin  100 mg Oral BID  . midazolam  2 mg Intravenous Once  . nicotine  21 mg Transdermal Daily  . oxyCODONE      . sevelamer carbonate  3,200 mg Oral TID WC  . sodium chloride flush  3 mL Intravenous Q12H  . zolpidem  5 mg Oral Once     Dialysis: [facility] 4h   72.5kg  2/2  Hep 3000  LUA AVF -mircera 200 ug next 11/29 -venofer load 7 doses left 100 mg -sensipar 120 mg tiw w/ HD -left over edw last 4 HD (76 - 81kg post) -s/o early about 50% of session -Hb 9.5, tsat 20%, Ca 10.7, P 4, pth 1475   Impression: 1. Acute dyspnea - pulm edema on CXR, vol overload on exam. Last OP HD was 11/16 - then had short tx on 11/19 and on 11/21  AVF infiltrated- now with [facility] will need to use for Diaz while. .  BP is higher again- very volume sensitive- was lower  than I have ever seen it earlier in hosp was on minoxidil 10 BID (probably evidence that he does not take at home ) - now on 5 BID with parameters 2. LUA AVF edema/blistering:  LUA AVF: edematous with blistering on inner aspect of AVF + bruit.Recent fistulogram per Dr. Bridgett Larsson (05/26/17)-AVF is patent without evidence of central stenosis.   I guess just Diaz bad infiltration- pt told me AVF ligated, I cannot feel bruit- having much bleeding- VVS at bedside- may need to go back to OR ?  3. Hyperkalemia -  better with HD- have not had robust treatments while here, plan for tomorrow AM  4. ESRD on HD- normally MWF- had Friday and Mon and Wed, then Fri AM to keep on schedule.  Wants to change centers- this may hold up his discharge 5. Hepatitis B + - HD in isolation  6. Hx hep C- s/p treatment 7. Hx substance abuse- claims abstinence 8. Anemia- hgb 8.8>>7.5 after surg- on max mircera due 11/29- give darbe here.  Transfused for hgb of 5.8- latest 6.5 9. Bones- rocatrol, renvela and sensipar- all cont from home- phos 9.8 10. Dispo- will work on changing centers- may keep him here til Friday, also still on IV pain meds   [name]

## 2017-06-02 NOTE — Progress Notes (Signed)
[facility]  Subjective  - arm feels better   Objective (!) 147/88 80 (!) 96.5 F (35.8 C) (Axillary) 12 100%  Intake/Output Summary (Last 24 hours) at 06/02/2017 0549 Last data filed at 06/02/2017 0507 Gross per 24 hour  Intake 3500 ml  Output 751 ml  Net 2749 ml   Left upper extremity still some edema but upper arm skin improved Small amount of incisional oozing 2+ left radial pulse  Assessment/Planning: S/p I and D left arm AV fistula with evacuation of hematoma Continue xeroform ACE wrap left arm Follow up lab later this morning  [name] 06/02/2017 5:49 AM --  Laboratory Lab Results: Recent Labs    06/01/17 0329 06/01/17 1927 06/01/17 2023  WBC 10.1  --   --   HGB 7.5* 5.8* 6.5*  HCT 21.9* 17.0* 19.0*  PLT 100*  --   --    BMET Recent Labs    05/30/17 1530  06/01/17 0329 06/01/17 1927 06/01/17 2023  NA 132*   < > 127* 130* 131*  K 6.2*   < > 5.8* 5.1 5.3*  CL 90*  --  87*  --   --   CO2 30  --  28  --   --   GLUCOSE 106*   < > 82 106* 99  BUN 36*  --  40*  --   --   CREATININE 6.49*  --  6.67*  --   --   CALCIUM 10.7*  --  9.8  --   --    < > = values in this interval not displayed.    COAG Lab Results  Component Value Date   INR 1.17 03/25/2017   INR >10.00 (HH) 03/24/2017   INR 1.26 02/23/2017   No results found for: PTT

## 2017-06-02 NOTE — Progress Notes (Signed)
Re-checked pt's left arm-ace bandage is dry.  Palpable left radial pulse.  Will hold off on OR at this time and advance pt's diet.     Leontine Locket, [facility] 06/02/2017 12:29 PM

## 2017-06-03 ENCOUNTER — Encounter (HOSPITAL_COMMUNITY)
Admission: EM | Disposition: A | Discharge status: Other Acute Inpatient Hospital | Payer: Self-pay | Source: Home / Self Care | Attending: Internal Medicine

## 2017-06-03 ENCOUNTER — Inpatient Hospital Stay (HOSPITAL_COMMUNITY): Payer: Medicare Other | Admitting: Certified Registered"

## 2017-06-03 ENCOUNTER — Encounter (HOSPITAL_COMMUNITY): Payer: Self-pay | Admitting: *Deleted

## 2017-06-03 HISTORY — PX: WOUND EXPLORATION: SHX6188

## 2017-06-03 LAB — BASIC METABOLIC PANEL
Anion gap: 10 (ref 5–15)
BUN: 46 mg/dL — ABNORMAL HIGH (ref 6–20)
CO2: 27 mmol/L (ref 22–32)
Calcium: 8.7 mg/dL — ABNORMAL LOW (ref 8.9–10.3)
Chloride: 91 mmol/L — ABNORMAL LOW (ref 101–111)
Creatinine, Ser: 7.49 mg/dL — ABNORMAL HIGH (ref 0.61–1.24)
GFR calc Af Amer: 8 mL/min — ABNORMAL LOW (ref >60)
GFR calc non Af Amer: 7 mL/min — ABNORMAL LOW (ref >60)
Glucose, Bld: 106 mg/dL — ABNORMAL HIGH (ref 65–99)
Potassium: 5.6 mmol/L — ABNORMAL HIGH (ref 3.5–5.1)
Sodium: 128 mmol/L — ABNORMAL LOW (ref 135–145)

## 2017-06-03 LAB — CBC
HCT: 18.2 % — ABNORMAL LOW (ref 39.0–52.0)
HCT: 21.3 % — ABNORMAL LOW (ref 39.0–52.0)
Hemoglobin: 6.4 g/dL — CL (ref 13.0–17.0)
Hemoglobin: 7.5 g/dL — ABNORMAL LOW (ref 13.0–17.0)
MCH: 30.2 pg (ref 26.0–34.0)
MCH: 30.1 pg (ref 26.0–34.0)
MCHC: 35.2 g/dL (ref 30.0–36.0)
MCHC: 35.2 g/dL (ref 30.0–36.0)
MCV: 85.8 fL (ref 78.0–100.0)
MCV: 85.5 fL (ref 78.0–100.0)
Platelets: 56 10*3/uL — ABNORMAL LOW (ref 150–400)
Platelets: 115 10*3/uL — ABNORMAL LOW (ref 150–400)
RBC: 2.12 MIL/uL — ABNORMAL LOW (ref 4.22–5.81)
RBC: 2.49 MIL/uL — ABNORMAL LOW (ref 4.22–5.81)
RDW: 19 % — ABNORMAL HIGH (ref 11.5–15.5)
RDW: 18.3 % — ABNORMAL HIGH (ref 11.5–15.5)
WBC: 8.9 10*3/uL (ref 4.0–10.5)
WBC: 14.1 10*3/uL — ABNORMAL HIGH (ref 4.0–10.5)

## 2017-06-03 LAB — SURGICAL PCR SCREEN
MRSA, PCR: NEGATIVE
Staphylococcus aureus: NEGATIVE

## 2017-06-03 LAB — CBC WITH DIFFERENTIAL/PLATELET
Basophils Absolute: 0 10*3/uL (ref 0.0–0.1)
Basophils Relative: 0 %
Eosinophils Absolute: 0 10*3/uL (ref 0.0–0.7)
Eosinophils Relative: 0 %
HCT: 18.4 % — ABNORMAL LOW (ref 39.0–52.0)
Hemoglobin: 6.4 g/dL — CL (ref 13.0–17.0)
Lymphocytes Relative: 7 %
Lymphs Abs: 1 10*3/uL (ref 0.7–4.0)
MCH: 29.5 pg (ref 26.0–34.0)
MCHC: 34.8 g/dL (ref 30.0–36.0)
MCV: 84.8 fL (ref 78.0–100.0)
Monocytes Absolute: 0.5 10*3/uL (ref 0.1–1.0)
Monocytes Relative: 4 %
Neutro Abs: 12.7 10*3/uL — ABNORMAL HIGH (ref 1.7–7.7)
Neutrophils Relative %: 89 %
Platelets: 125 10*3/uL — ABNORMAL LOW (ref 150–400)
RBC: 2.17 MIL/uL — ABNORMAL LOW (ref 4.22–5.81)
RDW: 18.6 % — ABNORMAL HIGH (ref 11.5–15.5)
WBC: 14.3 10*3/uL — ABNORMAL HIGH (ref 4.0–10.5)

## 2017-06-03 LAB — PREPARE RBC (CROSSMATCH)

## 2017-06-03 LAB — FIBRINOGEN: Fibrinogen: 236 mg/dL (ref 210–475)

## 2017-06-03 LAB — POCT I-STAT 4, (NA,K, GLUC, HGB,HCT)
Glucose, Bld: 83 mg/dL (ref 65–99)
HCT: 21 % — ABNORMAL LOW (ref 39.0–52.0)
Hemoglobin: 7.1 g/dL — ABNORMAL LOW (ref 13.0–17.0)
Potassium: 6 mmol/L — ABNORMAL HIGH (ref 3.5–5.1)
Sodium: 126 mmol/L — ABNORMAL LOW (ref 135–145)

## 2017-06-03 LAB — PROTIME-INR
INR: 1.17
Prothrombin Time: 14.8 seconds (ref 11.4–15.2)

## 2017-06-03 LAB — APTT: aPTT: 28 seconds (ref 24–36)

## 2017-06-03 SURGERY — WOUND EXPLORATION
Anesthesia: General | Laterality: Left

## 2017-06-03 MED ORDER — MIDAZOLAM HCL 5 MG/5ML IJ SOLN
INTRAMUSCULAR | Status: DC | PRN
Start: 2017-06-03 — End: 2017-06-03
  Administered 2017-06-03: 2 mg via INTRAVENOUS

## 2017-06-03 MED ORDER — SODIUM CHLORIDE 0.9 % IV SOLN: Freq: Once | INTRAVENOUS | Status: AC

## 2017-06-03 MED ORDER — CALCITRIOL 0.25 MCG PO CAPS
ORAL_CAPSULE | ORAL | Status: AC
  Administered 2017-06-03: 0.75 ug via ORAL
  Filled 2017-06-03: qty 3

## 2017-06-03 MED ORDER — ALBUTEROL SULFATE (2.5 MG/3ML) 0.083% IN NEBU
2.5000 mg | INHALATION_SOLUTION | RESPIRATORY_TRACT | Status: DC
  Administered 2017-06-03: 2.5 mg via RESPIRATORY_TRACT
  Filled 2017-06-03: qty 3

## 2017-06-03 MED ORDER — PHENYLEPHRINE HCL 10 MG/ML IJ SOLN
INTRAMUSCULAR | Status: DC | PRN
  Administered 2017-06-03 (×3): 120 ug via INTRAVENOUS

## 2017-06-03 MED ORDER — SODIUM CHLORIDE 0.9 % IV SOLN
20.0000 ug | Freq: Once | INTRAVENOUS | Status: AC
  Administered 2017-06-03: 20 ug via INTRAVENOUS
  Filled 2017-06-03: qty 5

## 2017-06-03 MED ORDER — ALBUTEROL SULFATE (2.5 MG/3ML) 0.083% IN NEBU
INHALATION_SOLUTION | RESPIRATORY_TRACT | Status: AC
  Administered 2017-06-03: 2.5 mg via RESPIRATORY_TRACT
  Filled 2017-06-03: qty 3

## 2017-06-03 MED ORDER — CISATRACURIUM BESYLATE 20 MG/10ML IV SOLN
INTRAVENOUS | Status: AC
  Filled 2017-06-03: qty 10

## 2017-06-03 MED ORDER — GLYCOPYRROLATE 0.2 MG/ML IJ SOLN
INTRAMUSCULAR | Status: DC | PRN
  Administered 2017-06-03: 0.4 mg via INTRAVENOUS

## 2017-06-03 MED ORDER — PHENYLEPHRINE 40 MCG/ML (10ML) SYRINGE FOR IV PUSH (FOR BLOOD PRESSURE SUPPORT)
PREFILLED_SYRINGE | INTRAVENOUS | Status: AC
  Filled 2017-06-03: qty 20

## 2017-06-03 MED ORDER — CEFAZOLIN SODIUM-DEXTROSE 2-4 GM/100ML-% IV SOLN
2.0000 g | INTRAVENOUS | Status: AC
  Administered 2017-06-03: 2 g via INTRAVENOUS
  Filled 2017-06-03 (×2): qty 100

## 2017-06-03 MED ORDER — CISATRACURIUM BESYLATE (PF) 10 MG/5ML IV SOLN
INTRAVENOUS | Status: DC | PRN
  Administered 2017-06-03: 14 mg via INTRAVENOUS
  Administered 2017-06-03: 4 mg via INTRAVENOUS
  Administered 2017-06-03: 2 mg via INTRAVENOUS

## 2017-06-03 MED ORDER — ONDANSETRON HCL 4 MG/2ML IJ SOLN
INTRAMUSCULAR | Status: AC
  Filled 2017-06-03: qty 2

## 2017-06-03 MED ORDER — MIDAZOLAM HCL 2 MG/2ML IJ SOLN
INTRAMUSCULAR | Status: AC
  Filled 2017-06-03: qty 2

## 2017-06-03 MED ORDER — FENTANYL CITRATE (PF) 250 MCG/5ML IJ SOLN
INTRAMUSCULAR | Status: AC
  Filled 2017-06-03: qty 5

## 2017-06-03 MED ORDER — DEXAMETHASONE SODIUM PHOSPHATE 10 MG/ML IJ SOLN
INTRAMUSCULAR | Status: AC
  Filled 2017-06-03: qty 1

## 2017-06-03 MED ORDER — PHENYLEPHRINE HCL 10 MG/ML IJ SOLN
INTRAVENOUS | Status: DC | PRN
  Administered 2017-06-03: 30 ug/min via INTRAVENOUS

## 2017-06-03 MED ORDER — DEXAMETHASONE SODIUM PHOSPHATE 10 MG/ML IJ SOLN
INTRAMUSCULAR | Status: DC | PRN
  Administered 2017-06-03: 10 mg via INTRAVENOUS

## 2017-06-03 MED ORDER — DARBEPOETIN ALFA 150 MCG/0.3ML IJ SOSY
PREFILLED_SYRINGE | INTRAMUSCULAR | Status: AC
  Administered 2017-06-03: 150 ug via INTRAVENOUS
  Filled 2017-06-03: qty 0.3

## 2017-06-03 MED ORDER — FENTANYL CITRATE (PF) 100 MCG/2ML IJ SOLN
INTRAMUSCULAR | Status: AC
Start: 2017-06-03 — End: 2017-06-03
  Administered 2017-06-03: 25 ug via INTRAVENOUS
  Filled 2017-06-03: qty 2

## 2017-06-03 MED ORDER — 0.9 % SODIUM CHLORIDE (POUR BTL) OPTIME
TOPICAL | Status: DC | PRN
  Administered 2017-06-03: 1000 mL

## 2017-06-03 MED ORDER — FENTANYL CITRATE (PF) 100 MCG/2ML IJ SOLN
25.0000 ug | INTRAMUSCULAR | Status: DC | PRN
  Administered 2017-06-03: 25 ug via INTRAVENOUS

## 2017-06-03 MED ORDER — SODIUM CHLORIDE 0.9 % IV SOLN
Freq: Once | INTRAVENOUS | Status: AC
  Administered 2017-06-03: 06:00:00 via INTRAVENOUS

## 2017-06-03 MED ORDER — PROTAMINE SULFATE 10 MG/ML IV SOLN
INTRAVENOUS | Status: AC
  Filled 2017-06-03: qty 5

## 2017-06-03 MED ORDER — ONDANSETRON HCL 4 MG/2ML IJ SOLN: 4.0000 mg | Freq: Once | INTRAMUSCULAR | Status: DC | PRN

## 2017-06-03 MED ORDER — SODIUM CHLORIDE 0.9 % IV SOLN
INTRAVENOUS | Status: DC
  Administered 2017-06-03: 09:00:00 via INTRAVENOUS

## 2017-06-03 MED ORDER — NEOSTIGMINE METHYLSULFATE 10 MG/10ML IV SOLN
INTRAVENOUS | Status: DC | PRN
  Administered 2017-06-03: 3 mg via INTRAVENOUS

## 2017-06-03 MED ORDER — ALBUTEROL SULFATE (2.5 MG/3ML) 0.083% IN NEBU: 2.5000 mg | INHALATION_SOLUTION | Freq: Once | RESPIRATORY_TRACT | Status: DC

## 2017-06-03 MED ORDER — FENTANYL CITRATE (PF) 100 MCG/2ML IJ SOLN
INTRAMUSCULAR | Status: AC
  Administered 2017-06-03: 25 ug via INTRAVENOUS
  Filled 2017-06-03: qty 2

## 2017-06-03 MED ORDER — LIDOCAINE HCL (CARDIAC) 20 MG/ML IV SOLN
INTRAVENOUS | Status: DC | PRN
  Administered 2017-06-03: 40 mg via INTRAVENOUS

## 2017-06-03 MED ORDER — PROPOFOL 10 MG/ML IV BOLUS
INTRAVENOUS | Status: DC | PRN
  Administered 2017-06-03: 160 mg via INTRAVENOUS

## 2017-06-03 MED ORDER — ONDANSETRON HCL 4 MG/2ML IJ SOLN
INTRAMUSCULAR | Status: DC | PRN
  Administered 2017-06-03: 4 mg via INTRAVENOUS

## 2017-06-03 MED ORDER — PROPOFOL 10 MG/ML IV BOLUS
INTRAVENOUS | Status: AC
  Filled 2017-06-03: qty 20

## 2017-06-03 MED ORDER — FENTANYL CITRATE (PF) 100 MCG/2ML IJ SOLN
INTRAMUSCULAR | Status: DC | PRN
  Administered 2017-06-03 (×3): 50 ug via INTRAVENOUS

## 2017-06-03 SURGICAL SUPPLY — 33 items
BNDG CMPR 9X4 STRL LF SNTH (GAUZE/BANDAGES/DRESSINGS) ×1
BNDG ESMARK 4X9 LF (GAUZE/BANDAGES/DRESSINGS) ×1 IMPLANT
BOOT SUTURE AID YELLOW STND (SUTURE) ×1 IMPLANT
CLIP TI MEDIUM 6 (CLIP) ×1 IMPLANT
CLIP VESOCCLUDE SM WIDE 6/CT (CLIP) ×1 IMPLANT
DRAPE EXTREMITY T 121X128X90 (DRAPE) ×1 IMPLANT
DRAPE INCISE IOBAN 66X45 STRL (DRAPES) ×1 IMPLANT
DRSG MEPITEL 4X7.2 (GAUZE/BANDAGES/DRESSINGS) ×2 IMPLANT
DRSG VAC ATS MED SENSATRAC (GAUZE/BANDAGES/DRESSINGS) ×2 IMPLANT
GAUZE SPONGE 4X4 16PLY XRAY LF (GAUZE/BANDAGES/DRESSINGS) ×1 IMPLANT
GLOVE BIOGEL PI IND STRL 6.5 (GLOVE) IMPLANT
GLOVE BIOGEL PI IND STRL 7.0 (GLOVE) IMPLANT
GLOVE BIOGEL PI IND STRL 7.5 (GLOVE) IMPLANT
GLOVE BIOGEL PI INDICATOR 6.5 (GLOVE) ×3
GLOVE BIOGEL PI INDICATOR 7.0 (GLOVE) ×1
GLOVE BIOGEL PI INDICATOR 7.5 (GLOVE) ×1
GOWN STRL REUS W/ TWL LRG LVL3 (GOWN DISPOSABLE) IMPLANT
GOWN STRL REUS W/ TWL XL LVL3 (GOWN DISPOSABLE) IMPLANT
GOWN STRL REUS W/TWL LRG LVL3 (GOWN DISPOSABLE) ×2
GOWN STRL REUS W/TWL XL LVL3 (GOWN DISPOSABLE) ×2
NDL 18GX1X1/2 (RX/OR ONLY) (NEEDLE) IMPLANT
NEEDLE 18GX1X1/2 (RX/OR ONLY) (NEEDLE) ×2 IMPLANT
SPONGE LAP 18X18 X RAY DECT (DISPOSABLE) ×1 IMPLANT
SUT ETHILON 3 0 PS 1 (SUTURE) ×2 IMPLANT
SUT PROLENE 5 0 C 1 24 (SUTURE) ×3 IMPLANT
SUT SILK 2 0 (SUTURE) ×2
SUT SILK 2-0 18XBRD TIE 12 (SUTURE) IMPLANT
SUT SILK 3 0 (SUTURE) ×2
SUT SILK 3-0 18XBRD TIE 12 (SUTURE) IMPLANT
SUT SILK 4 0 (SUTURE) ×2
SUT SILK 4-0 18XBRD TIE 12 (SUTURE) IMPLANT
SYR 10ML LL (SYRINGE) ×1 IMPLANT
WND VAC CANISTER 500ML (MISCELLANEOUS) ×1 IMPLANT

## 2017-06-03 NOTE — Anesthesia Preprocedure Evaluation (Addendum)
Anesthesia Evaluation  Patient identified by MRN, date of birth, ID band Patient awake    Reviewed: Allergy & Precautions, NPO status , Patient's Chart, lab work & pertinent test results  Airway Mallampati: II  TM Distance: >3 FB Neck ROM: Full    Dental  (+) Teeth Intact, Dental Advisory Given   Pulmonary Current Smoker,    breath sounds clear to auscultation       Cardiovascular hypertension,  Rhythm:Regular Rate:Normal     Neuro/Psych    GI/Hepatic   Endo/Other    Renal/GU      Musculoskeletal   Abdominal   Peds  Hematology   Anesthesia Other Findings   Reproductive/Obstetrics                            Anesthesia Physical Anesthesia Plan  ASA: III  Anesthesia Plan: General   Post-op Pain Management:    Induction: Intravenous  PONV Risk Score and Plan:   Airway Management Planned: LMA  Additional Equipment:   Intra-op Plan:   Post-operative Plan:   Informed Consent: I have reviewed the patients History and Physical, chart, labs and discussed the procedure including the risks, benefits and alternatives for the proposed anesthesia with the patient or authorized representative who has indicated his/her understanding and acceptance.   Dental advisory given  Plan Discussed with: CRNA and Anesthesiologist  Anesthesia Plan Comments: (K-6.0. Patient has bleeding from L. Arm access site. Patient to receive heparin on dialysis. I believe it is in the patients best interest to have exploration now and dialysis after surgery. Will give insulin and glucose and aluterol HHN prior to surgery.  Roberts Gaudy)        Anesthesia Quick Evaluation

## 2017-06-03 NOTE — Transfer of Care (Signed)
Immediate Anesthesia Transfer of Care Note  Patient: [name]  Procedure(s) Performed: WOUND EXPLORATION WITH WOUND VAC APPLICATION TO LEFT ARM (Left )  Patient Location: PACU  Anesthesia Type:General  Level of Consciousness: awake and patient cooperative  Airway & Oxygen Therapy: Patient Spontanous Breathing  Post-op Assessment: Report given to RN and Post -op Vital signs reviewed and stable  Post vital signs: Reviewed and stable  Last Vitals:  Vitals:   06/03/17 0559 06/03/17 0810  BP: (!) 158/85 (!) 158/94  Pulse: 80 81  Resp: 19 18  Temp: (!) 36.3 C (!) 36.2 C  SpO2: 100% 100%    Last Pain:  Vitals:   06/03/17 0810  TempSrc: Oral  PainSc: 3       Patients Stated Pain Goal: 0 (82/86/75 1982)  Complications: No apparent anesthesia complications

## 2017-06-03 NOTE — Plan of Care (Signed)
  Pain Managment:   Clinical Measurements:   Pain Managment: General experience of comfort will improve 06/03/2017 0113 - Not Progressing by Irish Lack, RN Pt pain level constant at  10/10   Clinical Measurements: Postoperative complications will be avoided or minimized 06/03/2017 0113 - Not Progressing by Irish Lack, RN Right upper arm surgical site bleeds changed pressure drsg reapplied.

## 2017-06-03 NOTE — Progress Notes (Signed)
PROGRESS NOTE    WESSON STITH  JGG:[id number] DOB: 08-09-1960 DOA: 05/29/2017 PCP: [name], MD     Brief Narrative:  ADRIENE PADULA is a 56 yo male with past medical history of hypertension, cocaine abuse, hepatitis B/C, COPD, chronic systolic heart failure, ESRD on dialysis who presented with sudden swelling in LUE. He had a left brachiocephalic AVF shuntogram on 05/26/2017 which noted a patent fistula with >51mm small pseudoaneurysm proximally and moderate bilobe pseudoaneurysm in the mid segment and moderate sized broad pseudoaneurysm distally. He had a tunneled dialysis catheter placed by vascular surgery on 11/19.  He was taken to the OR on 11/21 for evacuation of hematoma of the left arm, then again 11/23 for exploration of left upper arm, ligation of fistula, placement of VAC.    Assessment & Plan:   Principal Problem:   Dyspnea Active Problems:   Chronic hepatitis C without hepatic coma (HCC)   Essential hypertension   COPD (chronic obstructive pulmonary disease) (HCC)   Hyperkalemia   Tachycardia  Acute pulmonary edema -Now room air. Resolved  LUE AVF infiltration -Vascular surgery following -S/p evacuation left arm hematoma with ligation left arm AV fistula 11/21. Received 4u pRBC transfusion. S/p evacuation left arm hematoma with ligation and placement of VAC 11/23. Transfused another 1u pRBC post-op.   Acute blood loss anemia on chronic anemia of CKD -Baseline Hgb ~9-10 -Total 5u pRBC transfused thus far   Acute on chronic thrombocytopenia -Plt 100-->61-->56. Spoke with vascular PA this morning, ?HIT. HIT-Ab ordered by vascular surgery. Transfused 1u platelet post-op.  -In reviewing patient's previous lab work, he has had intermittently fluctuating platelet of 50s-80s over the past year. GI notes possibly related to chronic hepatitis   Mildly elevated troponin  -Peak 0.35 likely secondary to demand ischemia -Had left heart cath with stenting  high-grade prox RCA stenosis 02/22/17 and plavix for 6 months  Hx A Fib/flutter with aberrancy -S/p DCCV 03/24/17. Coumadin discontinued as patient noncompliant  ESRD on HD  -Nephrology following  Hepatitis B and C Substance abuse    DVT prophylaxis: SCD Code Status: Full Family Communication: No family at bedside Disposition Plan: Pending improvement    Consultants:   Nephrology  Vascular surgery  Antimicrobials:  Anti-infectives (From admission, onward)   Start     Dose/Rate Route Frequency Ordered Stop   06/03/17 0800  ceFAZolin (ANCEF) IVPB 2g/100 mL premix    Comments:  Send with pt to OR   2 g 200 mL/hr over 30 Minutes Intravenous To ShortStay Surgical 06/03/17 0752 06/03/17 1033   05/30/17 1530  cefUROXime (ZINACEF) 1.5 g in dextrose 5 % 50 mL IVPB     1.5 g 100 mL/hr over 30 Minutes Intravenous To ShortStay Surgical 05/30/17 1445 05/30/17 1721   05/30/17 1015  ceFAZolin (ANCEF) IVPB 2g/100 mL premix  Status:  Discontinued     2 g 200 mL/hr over 30 Minutes Intravenous To Radiology 05/30/17 1014 05/30/17 1458       Subjective: Patient is seen postoperatively.  His main complaint is abdominal pain due to hunger.  He is eating graham crackers at bedside.  He has no complaints of chest pain or shortness of breath.  No bleeding noted other than from his left upper arm.  Objective: Vitals:   06/03/17 1213 06/03/17 1230 06/03/17 1245 06/03/17 1300  BP: 123/76 (!) 146/91 (!) 147/83 (!) 154/91  Pulse: (!) 105 (!) 107 (!) 107 (!) 105  Resp: 17 15 12  (!) 28  Temp:  97.7 F (36.5 C)   97.9 F (36.6 C)  TempSrc:    Oral  SpO2: 97% 99% 99% 99%  Weight:      Height:        Intake/Output Summary (Last 24 hours) at 06/03/2017 1450 Last data filed at 06/03/2017 1209 Gross per 24 hour  Intake 1343 ml  Output 200 ml  Net 1143 ml   Filed Weights   06/01/17 1415 06/02/17 0500 06/03/17 0918  Weight: 80 kg (176 lb 5.9 oz) 78 kg (172 lb) 78 kg (172 lb)     Examination:  General exam: Appears calm and comfortable  Respiratory system: Clear to auscultation. Respiratory effort normal. On room air  Cardiovascular system: S1 & S2 heard, tachycardic, regular rhythm. No JVD, murmurs, rubs, gallops or clicks. Trace pedal edema. Gastrointestinal system: Abdomen is nondistended, soft and nontender. No organomegaly or masses felt. Normal bowel sounds heard. Central nervous system: Alert and oriented. No focal neurological deficits. Extremities: Symmetric, +LUE VAC Psychiatry: Judgement and insight appear stable   Data Reviewed: I have personally reviewed following labs and imaging studies  CBC: Recent Labs  Lab 05/29/17 1944  06/01/17 0329 06/01/17 1927 06/01/17 2023 06/02/17 0906 06/03/17 0243 06/03/17 0921  WBC 13.4*  --  10.1  --   --  12.9* 8.9  --   NEUTROABS 12.5*  --   --   --   --   --   --   --   HGB 10.3*   < > 7.5* 5.8* 6.5* 8.5* 6.4* 7.1*  HCT 30.5*   < > 21.9* 17.0* 19.0* 24.1* 18.2* 21.0*  MCV 84.5  --  83.3  --   --  84.0 85.8  --   PLT 170  --  100*  --   --  61* 56*  --    < > = values in this interval not displayed.   Basic Metabolic Panel: Recent Labs  Lab 05/30/17 0843 05/30/17 1530  06/01/17 0329 06/01/17 1927 06/01/17 2023 06/02/17 0906 06/03/17 0243 06/03/17 0921  NA 130* 132*   < > 127* 130* 131* 127* 128* 126*  K 7.0* 6.2*   < > 5.8* 5.1 5.3* 5.4* 5.6* 6.0*  CL 90* 90*  --  87*  --   --  88* 91*  --   CO2 27 30  --  28  --   --  27 27  --   GLUCOSE 151* 106*   < > 82 106* 99 94 106* 83  BUN 32* 36*  --  40*  --   --  38* 46*  --   CREATININE 6.03* 6.49*  --  6.67*  --   --  6.36* 7.49*  --   CALCIUM 10.6* 10.7*  --  9.8  --   --  8.9 8.7*  --    < > = values in this interval not displayed.   GFR: Estimated Creatinine Clearance: 11.3 mL/min (A) (by C-G formula based on SCr of 7.49 mg/dL (H)). Liver Function Tests: Recent Labs  Lab 05/29/17 1944 06/01/17 0329  AST 64* 36  ALT 43 29  ALKPHOS  152* 82  BILITOT 1.2 1.1  PROT 8.3* 6.4*  ALBUMIN 4.2 3.5   No results for input(s): LIPASE, AMYLASE in the last 168 hours. No results for input(s): AMMONIA in the last 168 hours. Coagulation Profile: Recent Labs  Lab 06/02/17 0906  INR 1.14   Cardiac Enzymes: Recent Labs  Lab 05/31/17 1211 05/31/17 1748  06/01/17 0329  TROPONINI 0.35* 0.27* 0.23*   BNP (last 3 results) No results for input(s): PROBNP in the last 8760 hours. HbA1C: No results for input(s): HGBA1C in the last 72 hours. CBG: No results for input(s): GLUCAP in the last 168 hours. Lipid Profile: No results for input(s): CHOL, HDL, LDLCALC, TRIG, CHOLHDL, LDLDIRECT in the last 72 hours. Thyroid Function Tests: No results for input(s): TSH, T4TOTAL, FREET4, T3FREE, THYROIDAB in the last 72 hours. Anemia Panel: No results for input(s): VITAMINB12, FOLATE, FERRITIN, TIBC, IRON, RETICCTPCT in the last 72 hours. Sepsis Labs: No results for input(s): PROCALCITON, LATICACIDVEN in the last 168 hours.  Recent Results (from the past 240 hour(s))  Surgical PCR screen     Status: None   Collection Time: 06/03/17  8:39 AM  Result Value Ref Range Status   MRSA, PCR NEGATIVE NEGATIVE Final   Staphylococcus aureus NEGATIVE NEGATIVE Final    Comment: (NOTE) The Xpert SA Assay (FDA approved for NASAL specimens in patients 75 years of age and older), is one component of a comprehensive surveillance program. It is not intended to diagnose infection nor to guide or monitor treatment.        Radiology Studies: No results found.    Scheduled Meds: . calcitRIOL  0.75 mcg Oral Q M,W,F-HD  . cinacalcet  120 mg Oral Q M,W,F-HD  . darbepoetin (ARANESP) injection - DIALYSIS  150 mcg Intravenous Q Fri-HD  . gabapentin  100 mg Oral BID  . nicotine  21 mg Transdermal Daily  . sevelamer carbonate  3,200 mg Oral TID WC  . sodium chloride flush  3 mL Intravenous Q12H  . zolpidem  5 mg Oral Once   Continuous Infusions: .  sodium chloride Stopped (06/03/17 1209)  . sodium chloride    . sodium chloride    . sodium chloride 10 mL/hr at 06/01/17 1715  . sodium chloride    . sodium chloride    . sodium chloride Stopped (06/03/17 0400)  . sodium chloride 10 mL/hr at 06/03/17 0924  . sodium chloride       LOS: 5 days    Time spent: 30 minutes   Dessa Phi, DO [facility] [email or web address] Password [email or web address] 06/03/2017, 2:50 PM

## 2017-06-03 NOTE — Anesthesia Procedure Notes (Signed)
Procedure Name: Intubation Date/Time: 06/03/2017 10:30 AM Performed by: Lance Coon, CRNA Pre-anesthesia Checklist: Patient identified, Emergency Drugs available, Suction available, Patient being monitored and Timeout performed Patient Re-evaluated:Patient Re-evaluated prior to induction Oxygen Delivery Method: Circle system utilized Preoxygenation: Pre-oxygenation with 100% oxygen Induction Type: IV induction Ventilation: Mask ventilation without difficulty and Oral airway inserted - appropriate to patient size Laryngoscope Size: Miller and 3 Grade View: Grade I Tube type: Oral Tube size: 7.5 mm Number of attempts: 2 Airway Equipment and Method: Stylet Placement Confirmation: ETT inserted through vocal cords under direct vision,  positive ETCO2 and breath sounds checked- equal and bilateral Secured at: 21 cm Tube secured with: Tape Dental Injury: Teeth and Oropharynx as per pre-operative assessment  Comments: Attempted LMA #4 twice, unable to get adequate tidal volumes decision made to intubate.

## 2017-06-03 NOTE — Progress Notes (Signed)
Pt returned from PACU. Report received from [place], [place]. Pt on wound vac at 125, vital signs stable, bed in lowest position, call bed within reach.

## 2017-06-03 NOTE — Progress Notes (Signed)
Received call from the lab ([facility], Plebotomy), Dr Maylene Roes notified. After speaking with Dr. Maylene Roes, pt agreed to have lab draw.

## 2017-06-03 NOTE — Progress Notes (Signed)
Pt noticed to be bleeding from surgical site again .  Pressure applied.  MD applied, desmopressin and verbal order received from Scot Dock, MD for STAT labs (CBC and coag ordered).  Another dressing applied.  VS stable desmopressin IV given as ordered.  Dressing clean and dry wound dressing. Continue to monitor.

## 2017-06-03 NOTE — Progress Notes (Addendum)
Pt observed to be bleeding from left upper arm surgical site.  Dressing was saturated with bright red blood and leaking. 400 ml of bloody drainage emptied from the wound vac.  MD was notified instruction were given to change dressing.  Charge nurse notified.  Swat nurse at bedside applied pressure on the surgical site.  Dressing changed by the MD.  Vital signs remained stable.

## 2017-06-03 NOTE — Progress Notes (Signed)
VASCULAR SURGERY:  Called to see patient because of bleeding around his VAC dressing.  The patient had significant hematoma underlying the dressing and therefore felt the best thing was to simply remove this.  The wound was then packed and the wound wrapped with Kerlix and an Ace bandage.  He had significant thrombocytopenia earlier today and his platelet count has improved after transfusion.  I will also check his coags.  HIT has been ordered.  If he has persistent bleeding we could potentially have to take him back to the operating room, however I want to be sure that his platelet count and coag parameters are normalized before considering this.  [name], MD, [place] [phone] Office: [phone]

## 2017-06-03 NOTE — Interval H&P Note (Signed)
History and Physical Interval Note:  06/03/2017 9:57 AM  [name]  has presented today for surgery, with the diagnosis of chronic renal failure   The various methods of treatment have been discussed with the patient and family. After consideration of risks, benefits and other options for treatment, the patient has consented to  Procedure(s): WOUND EXPLORATION (Left) as a surgical intervention .  The patient's history has been reviewed, patient examined, no change in status, stable for surgery.  I have reviewed the patient's chart and labs.  Questions were answered to the patient's satisfaction.     [name]

## 2017-06-03 NOTE — Op Note (Signed)
    NAME: [name]    MRN: [id number] DOB: [birth year]    DATE OF OPERATION: 06/03/2017  PREOP DIAGNOSIS:    Bleeding status post ligation of left upper arm AV fistula  POSTOP DIAGNOSIS:    Same  PROCEDURE:    1.  Exploration of left upper arm 2.  Ligation of central portion of left upper arm fistula 3.  Ligation of proximal aspect of left upper arm fistula 4.  Evacuation of hematoma 5.  Placement of VAC  SURGEON: Judeth Cornfield. Scot Dock, MD, FACS  ASSIST: Leontine Locket, PA  ANESTHESIA: General  EBL: 100 cc  INDICATIONS:    [name] is a 56 y.o. male who had an infiltrate during dialysis and developed a large hematoma.  Last week, the patient underwent evacuation of the hematoma in the proximal fistula was divided and oversewn at each end.  He was having problems with bleeding from the incision and therefore was brought back to the operating room for exploration.  Of note he had significant compromise of the skin on both sides of the incision from the start.  FINDINGS:   There was some bleeding from the area that was oversewn on the fistula.  For this reason I ligated the fistula just below the shoulder.  The patient had a very extensive wound with compromised skin and I felt really the only option to close this was placement of a negative pressure dressing.  TECHNIQUE:   The patient was taken to the operating room and received a general anesthetic.  The left arm was prepped and draped in usual sterile fashion.  The sutures were removed and a large amount of hematoma was evacuated.  There was some generalized oozing as the patient has significant thrombocytopenia.  However there was some bleeding from the fistula where it had been oversewn centrally.  I placed a 5-0 Prolene here.  The fistula appeared to be still under pressure from back bleeding and therefore elected to ligated just below the shoulder.  A separate incision was made just below the shoulder where the  cephalic vein fistula was dissected free and ligated with a 2-0 silk tie.  Next I was concerned about how it would close this wound.  There was a large proximal stump of the fistula which would be right under the incision.  Therefore dissected this free further towards the anastomosis and ligated with 2-0 silk ties.  I then excised the stump to allow more room for closure.  The wound was irrigated with cups amounts of saline and hemostasis obtained.  The area over the proximal fistula was closed with interrupted 3-0 nylon's.  Mepitel was then placed over the wound and a negative pressure dressing was placed.  This was connected to suction with a good seal.  The patient tolerated the procedure well was transferred to the recovery room in stable condition.  All needle and sponge counts were correct.  [name], MD, FACS [facility]  DATE OF DICTATION:   06/03/2017

## 2017-06-03 NOTE — Progress Notes (Signed)
[facility] Progress Note   Subjective: Patient has no C/Os at present other than being hungry.   Objective Vitals:   06/03/17 1213 06/03/17 1230 06/03/17 1245 06/03/17 1300  BP: 123/76 (!) 146/91 (!) 147/83 (!) 154/91  Pulse: (!) 105 (!) 107 (!) 107 (!) 105  Resp: 17 15 12  (!) 28  Temp: 97.7 F (36.5 C)   97.9 F (36.6 C)  TempSrc:    Oral  SpO2: 97% 99% 99% 99%  Weight:      Height:       Physical Exam General: Awake, sitting up at bedside, NAD Heart: S1,S2 RRR HR 105-110 Lungs: Bilateral breath sounds decreased in bases few scattered coarse breath sounds. No WOB.  Abdomen: Active BS. Extremities: Trace pre tib edema.  Dialysis Access: RIJ TDC drsg intact. Wound vac to now ligated LUA AVF with sanguinous drainage.    Additional Objective Labs: Basic Metabolic Panel: Recent Labs  Lab 06/01/17 0329  06/02/17 0906 06/03/17 0243 06/03/17 0921  NA 127*   < > 127* 128* 126*  K 5.8*   < > 5.4* 5.6* 6.0*  CL 87*  --  88* 91*  --   CO2 28  --  27 27  --   GLUCOSE 82   < > 94 106* 83  BUN 40*  --  38* 46*  --   CREATININE 6.67*  --  6.36* 7.49*  --   CALCIUM 9.8  --  8.9 8.7*  --    < > = values in this interval not displayed.   Liver Function Tests: Recent Labs  Lab 05/29/17 1944 06/01/17 0329  AST 64* 36  ALT 43 29  ALKPHOS 152* 82  BILITOT 1.2 1.1  PROT 8.3* 6.4*  ALBUMIN 4.2 3.5   CBC: Recent Labs  Lab 05/29/17 1944  06/01/17 0329  06/02/17 0906 06/03/17 0243 06/03/17 0921  WBC 13.4*  --  10.1  --  12.9* 8.9  --   NEUTROABS 12.5*  --   --   --   --   --   --   HGB 10.3*   < > 7.5*   < > 8.5* 6.4* 7.1*  HCT 30.5*   < > 21.9*   < > 24.1* 18.2* 21.0*  MCV 84.5  --  83.3  --  84.0 85.8  --   PLT 170  --  100*  --  61* 56*  --    < > = values in this interval not displayed.   Blood Culture    Component Value Date/Time   SDES BLOOD RIGHT FOREARM 02/20/2017 1715   SPECREQUEST  02/20/2017 1715    BOTTLES DRAWN AEROBIC AND ANAEROBIC  Blood Culture adequate volume   CULT NO GROWTH 5 DAYS 02/20/2017 1715   REPTSTATUS 02/25/2017 FINAL 02/20/2017 1715    Cardiac Enzymes: Recent Labs  Lab 05/31/17 1211 05/31/17 1748 06/01/17 0329  TROPONINI 0.35* 0.27* 0.23*   Studies/Results: No results found. Medications: . sodium chloride Stopped (06/03/17 1209)  . sodium chloride    . sodium chloride    . sodium chloride 10 mL/hr at 06/01/17 1715  . sodium chloride    . sodium chloride    . sodium chloride Stopped (06/03/17 0400)  . sodium chloride 10 mL/hr at 06/03/17 0924  . sodium chloride     . calcitRIOL  0.75 mcg Oral Q M,W,F-HD  . cinacalcet  120 mg Oral Q M,W,F-HD  . darbepoetin (ARANESP) injection - DIALYSIS  150  mcg Intravenous Q Fri-HD  . gabapentin  100 mg Oral BID  . midazolam  2 mg Intravenous Once  . nicotine  21 mg Transdermal Daily  . sevelamer carbonate  3,200 mg Oral TID WC  . sodium chloride flush  3 mL Intravenous Q12H  . zolpidem  5 mg Oral Once     Dialysis:[facility] 4h 72.5kg 2/2 Hep 3000 LUA AVF -mircera 200 ug next 11/29 -venofer load 7 doses left 100 mg -sensipar 120 mg tiw w/ HD -left over edw last 4 HD (76 - 81kg post) -s/o early about 50% of session -Hb 9.5, tsat 20%, Ca 10.7, P 4, pth 1475   Impression: 1. Acute dyspnea - pulm edema on CXR, vol overload on exam. Last OP HD was 11/16 - then had short tx on 11/19 and on 11/21  AVF infiltrated- now with [facility] will need to use for a while. .  BP is higher again- very volume sensitive- was lower  than I have ever seen it earlier in hosp was on minoxidil 10 BID (probably evidence that he does not take at home ) - now on 5 BID with parameters 2. LUA AVF edema/blistering: S/P ligation of AVF/Evacuation of hematoma with wound vac 06/03/17.  AVF ligated, now with wound vac placement 150cc sanguinous drainage in vac. Has perm cath. 3. Hyperkalemia - K+ 6.0 this AM. Repeat renal function prior to HD. Run on 1.0 K bath.  4. ESRD on HD-  normally MWF- had Friday and Mon and Wed, then Fri AM to keep on schedule.  Wants to change centers- this may hold up his discharge 5. Hepatitis B + - HD in isolation  6. Hx hep C- s/p treatment 7. Hx substance abuse- claims abstinence 8. Anemia- hgb 8.8>>7.5 after surg- on max mircera due 11/29- give darbe here.  Transfused for hgb of 5.8- latest HGB 7.1 but doubt accuracy-ISTAT. Will recheck CBC in HD and transfuse as needed in HD.  9. Bones- rocatrol, renvela and sensipar- all cont from home- phos 9.8 10. Dispo- will work on changing centers may delay DC as noted above. CLIP process initiated.    Rita H. Brown NP-C 06/03/2017, 1:41 PM  [facility] [phone]  Patient seen and examined, agree with above note with above modifications. Very alert in room after OR- is due for HD today  And needs it for potassium and fluid- is puffy  Corliss Parish, MD 06/03/2017

## 2017-06-03 NOTE — Anesthesia Postprocedure Evaluation (Signed)
Anesthesia Post Note  Patient: [name]  Procedure(s) Performed: WOUND EXPLORATION WITH WOUND VAC APPLICATION TO LEFT ARM (Left )     Patient location during evaluation: PACU Anesthesia Type: General Level of consciousness: awake, awake and alert and oriented Pain management: pain level controlled Vital Signs Assessment: post-procedure vital signs reviewed and stable Respiratory status: spontaneous breathing, nonlabored ventilation and respiratory function stable Cardiovascular status: blood pressure returned to baseline Anesthetic complications: no    Last Vitals:  Vitals:   06/03/17 1245 06/03/17 1300  BP: (!) 147/83 (!) 154/91  Pulse: (!) 107 (!) 105  Resp: 12 (!) 28  Temp:  36.6 C  SpO2: 99% 99%    Last Pain:  Vitals:   06/03/17 1300  TempSrc: Oral  PainSc:                  Ginelle Bays COKER

## 2017-06-04 ENCOUNTER — Encounter (HOSPITAL_COMMUNITY): Payer: Self-pay | Admitting: Vascular Surgery

## 2017-06-04 LAB — BASIC METABOLIC PANEL
Anion gap: 11 (ref 5–15)
Anion gap: 10 (ref 5–15)
Anion gap: 10 (ref 5–15)
BUN: 50 mg/dL — ABNORMAL HIGH (ref 6–20)
BUN: 23 mg/dL — ABNORMAL HIGH (ref 6–20)
BUN: 31 mg/dL — ABNORMAL HIGH (ref 6–20)
CO2: 27 mmol/L (ref 22–32)
CO2: 28 mmol/L (ref 22–32)
CO2: 28 mmol/L (ref 22–32)
Calcium: 9.3 mg/dL (ref 8.9–10.3)
Calcium: 8.4 mg/dL — ABNORMAL LOW (ref 8.9–10.3)
Calcium: 9.1 mg/dL (ref 8.9–10.3)
Chloride: 91 mmol/L — ABNORMAL LOW (ref 101–111)
Chloride: 94 mmol/L — ABNORMAL LOW (ref 101–111)
Chloride: 92 mmol/L — ABNORMAL LOW (ref 101–111)
Creatinine, Ser: 8.02 mg/dL — ABNORMAL HIGH (ref 0.61–1.24)
Creatinine, Ser: 4.51 mg/dL — ABNORMAL HIGH (ref 0.61–1.24)
Creatinine, Ser: 5.77 mg/dL — ABNORMAL HIGH (ref 0.61–1.24)
GFR calc Af Amer: 8 mL/min — ABNORMAL LOW (ref >60)
GFR calc Af Amer: 16 mL/min — ABNORMAL LOW (ref >60)
GFR calc Af Amer: 12 mL/min — ABNORMAL LOW (ref >60)
GFR calc non Af Amer: 7 mL/min — ABNORMAL LOW (ref >60)
GFR calc non Af Amer: 14 mL/min — ABNORMAL LOW (ref >60)
GFR calc non Af Amer: 10 mL/min — ABNORMAL LOW (ref >60)
Glucose, Bld: 184 mg/dL — ABNORMAL HIGH (ref 65–99)
Glucose, Bld: 164 mg/dL — ABNORMAL HIGH (ref 65–99)
Glucose, Bld: 118 mg/dL — ABNORMAL HIGH (ref 65–99)
Potassium: 6.8 mmol/L (ref 3.5–5.1)
Potassium: 4.1 mmol/L (ref 3.5–5.1)
Potassium: 4.4 mmol/L (ref 3.5–5.1)
Sodium: 129 mmol/L — ABNORMAL LOW (ref 135–145)
Sodium: 132 mmol/L — ABNORMAL LOW (ref 135–145)
Sodium: 130 mmol/L — ABNORMAL LOW (ref 135–145)

## 2017-06-04 LAB — CBC WITH DIFFERENTIAL/PLATELET
Basophils Absolute: 0 10*3/uL (ref 0.0–0.1)
Basophils Absolute: 0 10*3/uL (ref 0.0–0.1)
Basophils Relative: 0 %
Basophils Relative: 0 %
Eosinophils Absolute: 0 10*3/uL (ref 0.0–0.7)
Eosinophils Absolute: 0.1 10*3/uL (ref 0.0–0.7)
Eosinophils Relative: 0 %
Eosinophils Relative: 1 %
HCT: 18.8 % — ABNORMAL LOW (ref 39.0–52.0)
HCT: 19.3 % — ABNORMAL LOW (ref 39.0–52.0)
Hemoglobin: 6.7 g/dL — CL (ref 13.0–17.0)
Hemoglobin: 6.7 g/dL — CL (ref 13.0–17.0)
Lymphocytes Relative: 12 %
Lymphocytes Relative: 15 %
Lymphs Abs: 1.4 10*3/uL (ref 0.7–4.0)
Lymphs Abs: 1.6 10*3/uL (ref 0.7–4.0)
MCH: 30.5 pg (ref 26.0–34.0)
MCH: 30 pg (ref 26.0–34.0)
MCHC: 35.6 g/dL (ref 30.0–36.0)
MCHC: 34.7 g/dL (ref 30.0–36.0)
MCV: 85.5 fL (ref 78.0–100.0)
MCV: 86.5 fL (ref 78.0–100.0)
Monocytes Absolute: 1.2 10*3/uL — ABNORMAL HIGH (ref 0.1–1.0)
Monocytes Absolute: 1.7 10*3/uL — ABNORMAL HIGH (ref 0.1–1.0)
Monocytes Relative: 10 %
Monocytes Relative: 15 %
Neutro Abs: 8.8 10*3/uL — ABNORMAL HIGH (ref 1.7–7.7)
Neutro Abs: 7.7 10*3/uL (ref 1.7–7.7)
Neutrophils Relative %: 78 %
Neutrophils Relative %: 69 %
Platelets: 62 10*3/uL — ABNORMAL LOW (ref 150–400)
Platelets: 71 10*3/uL — ABNORMAL LOW (ref 150–400)
RBC: 2.2 MIL/uL — ABNORMAL LOW (ref 4.22–5.81)
RBC: 2.23 MIL/uL — ABNORMAL LOW (ref 4.22–5.81)
RDW: 17.6 % — ABNORMAL HIGH (ref 11.5–15.5)
RDW: 16.4 % — ABNORMAL HIGH (ref 11.5–15.5)
WBC: 11.4 10*3/uL — ABNORMAL HIGH (ref 4.0–10.5)
WBC: 11.2 10*3/uL — ABNORMAL HIGH (ref 4.0–10.5)

## 2017-06-04 LAB — BPAM PLATELET PHERESIS
Blood Product Expiration Date: 201811252359
ISSUE DATE / TIME: 201811231132
Unit Type and Rh: 6200

## 2017-06-04 LAB — PREPARE PLATELET PHERESIS: Unit division: 0

## 2017-06-04 LAB — HEPATIC FUNCTION PANEL
ALT: 21 U/L (ref 17–63)
AST: 38 U/L (ref 15–41)
Albumin: 2.9 g/dL — ABNORMAL LOW (ref 3.5–5.0)
Alkaline Phosphatase: 96 U/L (ref 38–126)
Bilirubin, Direct: 0.2 mg/dL (ref 0.1–0.5)
Indirect Bilirubin: 0.8 mg/dL (ref 0.3–0.9)
Total Bilirubin: 1 mg/dL (ref 0.3–1.2)
Total Protein: 5.2 g/dL — ABNORMAL LOW (ref 6.5–8.1)

## 2017-06-04 LAB — PROTIME-INR
INR: 1.2
Prothrombin Time: 15.1 seconds (ref 11.4–15.2)

## 2017-06-04 LAB — PREPARE RBC (CROSSMATCH)

## 2017-06-04 LAB — HEPARIN INDUCED PLATELET AB (HIT ANTIBODY): Heparin Induced Plt Ab: 0.173 OD (ref 0.000–0.400)

## 2017-06-04 MED ORDER — SODIUM CHLORIDE 0.9 % IV SOLN
Freq: Once | INTRAVENOUS | Status: AC
  Administered 2017-06-04: 11:00:00 via INTRAVENOUS

## 2017-06-04 MED ORDER — HYDROMORPHONE HCL 2 MG PO TABS
4.0000 mg | ORAL_TABLET | ORAL | Status: DC | PRN
  Administered 2017-06-04 – 2017-06-09 (×11): 4 mg via ORAL
  Filled 2017-06-04 (×12): qty 2

## 2017-06-04 MED ORDER — SODIUM CHLORIDE 0.9 % IV SOLN
Freq: Once | INTRAVENOUS | Status: AC
  Administered 2017-06-04: 17:00:00 via INTRAVENOUS

## 2017-06-04 MED ORDER — SODIUM CHLORIDE 0.9 % IV SOLN
20.0000 ug | Freq: Once | INTRAVENOUS | Status: AC
  Administered 2017-06-04: 20 ug via INTRAVENOUS
  Filled 2017-06-04: qty 5

## 2017-06-04 MED ORDER — OXYCODONE-ACETAMINOPHEN 5-325 MG PO TABS
1.0000 | ORAL_TABLET | ORAL | Status: DC | PRN
  Administered 2017-06-05 – 2017-06-10 (×9): 2 via ORAL
  Filled 2017-06-04 (×9): qty 2
  Filled 2017-06-04: qty 1
  Filled 2017-06-04: qty 2

## 2017-06-04 MED ORDER — HYDROMORPHONE HCL 1 MG/ML IJ SOLN
1.0000 mg | Freq: Once | INTRAMUSCULAR | Status: AC | PRN
  Administered 2017-06-04: 1 mg via INTRAVENOUS
  Filled 2017-06-04: qty 1

## 2017-06-04 MED ORDER — HEPARIN SODIUM (PORCINE) 1000 UNIT/ML DIALYSIS
1000.0000 [IU] | INTRAMUSCULAR | Status: DC | PRN
  Administered 2017-06-04: 1000 [IU] via INTRAVENOUS_CENTRAL
  Filled 2017-06-04: qty 1

## 2017-06-04 MED ORDER — MUSCLE RUB 10-15 % EX CREA
TOPICAL_CREAM | CUTANEOUS | Status: DC | PRN
  Filled 2017-06-04: qty 85

## 2017-06-04 NOTE — Progress Notes (Signed)
PROGRESS NOTE    [name]  ZHY:[id number] DOB: Apr 05, 1961 DOA: 05/29/2017 PCP: [name], MD     Brief Narrative:  [name] is a 56 yo male with past medical history of hypertension, cocaine abuse, hepatitis B/C, COPD, chronic systolic heart failure, ESRD on dialysis who presented with sudden swelling in LUE. He had a left brachiocephalic AVF shuntogram on 05/26/2017 which noted a patent fistula with >29mm small pseudoaneurysm proximally and moderate bilobe pseudoaneurysm in the mid segment and moderate sized broad pseudoaneurysm distally. He had a tunneled dialysis catheter placed by vascular surgery on 11/19.  He was taken to the OR on 11/21 for evacuation of hematoma of the left arm, then again 11/23 for exploration of left upper arm, ligation of fistula, placement of VAC.    Assessment & Plan:   Principal Problem:   Dyspnea Active Problems:   Chronic hepatitis C without hepatic coma (HCC)   Essential hypertension   COPD (chronic obstructive pulmonary disease) (HCC)   Hyperkalemia   Tachycardia  Acute pulmonary edema -Now room air. Resolved  LUE AVF infiltration -Vascular surgery following -S/p evacuation left arm hematoma with ligation left arm AV fistula 11/21. Transfused 4u pRBC   -S/p evacuation left arm hematoma with ligation and placement of VAC 11/23. Transfused 3u pRBC -VAC removed overnight due to continued bleed  -Transfuse 2u pRBC today 11/24   Acute blood loss anemia on chronic anemia of CKD -Baseline Hgb ~9-10 -Total 9 pRBC transfused thus far. Repeat CBC this afternoon post transfusions   Acute on chronic thrombocytopenia -Plt 100-->61-->56. Spoke with vascular PA, ?HIT. HIT-Ab ordered by vascular surgery. Transfused 1u platelet post-op on 11/23 -In reviewing patient's previous lab work, he has had intermittently fluctuating platelet of 50s-80s over the past year. GI notes possibly related to chronic hepatitis  -Ordered pathology review  smear today   Mildly elevated troponin  -Peak 0.35 likely secondary to demand ischemia -Had left heart cath with stenting high-grade prox RCA stenosis 02/22/17 and plavix for 6 months  Hx A Fib/flutter with aberrancy -S/p DCCV 03/24/17. Coumadin discontinued as patient noncompliant  ESRD on HD  -Nephrology following -In process of changing HD centers at patient request. CLIP in process   Hepatitis B and C Substance abuse    DVT prophylaxis: SCD Code Status: Full Family Communication: No family at bedside Disposition Plan: Pending improvement    Consultants:   Nephrology  Vascular surgery  Antimicrobials:  Anti-infectives (From admission, onward)   Start     Dose/Rate Route Frequency Ordered Stop   06/03/17 0800  ceFAZolin (ANCEF) IVPB 2g/100 mL premix    Comments:  Send with pt to OR   2 g 200 mL/hr over 30 Minutes Intravenous To ShortStay Surgical 06/03/17 0752 06/03/17 1033   05/30/17 1530  cefUROXime (ZINACEF) 1.5 g in dextrose 5 % 50 mL IVPB     1.5 g 100 mL/hr over 30 Minutes Intravenous To ShortStay Surgical 05/30/17 1445 05/30/17 1721   05/30/17 1015  ceFAZolin (ANCEF) IVPB 2g/100 mL premix  Status:  Discontinued     2 g 200 mL/hr over 30 Minutes Intravenous To Radiology 05/30/17 1014 05/30/17 1458       Subjective: No complaints, upset about nursing tech.  Continues to bleed from left upper arm through the dressing  Objective: Vitals:   06/04/17 0132 06/04/17 0535 06/04/17 1014 06/04/17 1045  BP: 129/79 (!) 116/56 (!) 134/105 122/67  Pulse: 94 (!) 111 (!) 120 (!) 108  Resp: Marland Kitchen)  23 16 18 18   Temp: 97.6 F (36.4 C) 97.8 F (36.6 C) (!) 97.4 F (36.3 C) 98.1 F (36.7 C)  TempSrc: Axillary Axillary Axillary Axillary  SpO2: 100% 100% 100%   Weight: 78.1 kg (172 lb 2.9 oz)     Height:        Intake/Output Summary (Last 24 hours) at 06/04/2017 1334 Last data filed at 06/04/2017 1014 Gross per 24 hour  Intake 1012 ml  Output 4408 ml  Net -3396 ml     Filed Weights   06/03/17 0918 06/03/17 2115 06/04/17 0132  Weight: 78 kg (172 lb) 82.1 kg (180 lb 16 oz) 78.1 kg (172 lb 2.9 oz)    Examination:  General exam: Appears calm and comfortable  Respiratory system: Clear to auscultation. Respiratory effort normal. On room air  Cardiovascular system: S1 & S2 heard, tachycardic, regular rhythm. No JVD, murmurs, rubs, gallops or clicks. Trace pedal edema. Gastrointestinal system: Abdomen is nondistended, soft and nontender. No organomegaly or masses felt. Normal bowel sounds heard. Central nervous system: Alert and oriented. No focal neurological deficits. Extremities: Symmetric, +LUE with pressure dressing in place, +left radial pulse in tact  Psychiatry: Judgement and insight appear stable   Data Reviewed: I have personally reviewed following labs and imaging studies  CBC: Recent Labs  Lab 05/29/17 1944  06/02/17 0906 06/03/17 0243 06/03/17 0921 06/03/17 1502 06/03/17 1911 06/04/17 0332  WBC 13.4*   < > 12.9* 8.9  --  14.1* 14.3* 11.4*  NEUTROABS 12.5*  --   --   --   --   --  12.7* 8.8*  HGB 10.3*   < > 8.5* 6.4* 7.1* 7.5* 6.4* 6.7*  HCT 30.5*   < > 24.1* 18.2* 21.0* 21.3* 18.4* 18.8*  MCV 84.5   < > 84.0 85.8  --  85.5 84.8 85.5  PLT 170   < > 61* 56*  --  115* 125* 62*   < > = values in this interval not displayed.   Basic Metabolic Panel: Recent Labs  Lab 06/01/17 0329  06/02/17 0906 06/03/17 0243 06/03/17 0921 06/03/17 1502 06/04/17 0332  NA 127*   < > 127* 128* 126* 129* 132*  K 5.8*   < > 5.4* 5.6* 6.0* 6.8* 4.1  CL 87*  --  88* 91*  --  91* 94*  CO2 28  --  27 27  --  27 28  GLUCOSE 82   < > 94 106* 83 184* 164*  BUN 40*  --  38* 46*  --  50* 23*  CREATININE 6.67*  --  6.36* 7.49*  --  8.02* 4.51*  CALCIUM 9.8  --  8.9 8.7*  --  9.3 8.4*   < > = values in this interval not displayed.   GFR: Estimated Creatinine Clearance: 18.7 mL/min (A) (by C-G formula based on SCr of 4.51 mg/dL (H)). Liver Function  Tests: Recent Labs  Lab 05/29/17 1944 06/01/17 0329  AST 64* 36  ALT 43 29  ALKPHOS 152* 82  BILITOT 1.2 1.1  PROT 8.3* 6.4*  ALBUMIN 4.2 3.5   No results for input(s): LIPASE, AMYLASE in the last 168 hours. No results for input(s): AMMONIA in the last 168 hours. Coagulation Profile: Recent Labs  Lab 06/02/17 0906 06/03/17 1911  INR 1.14 1.17   Cardiac Enzymes: Recent Labs  Lab 05/31/17 1211 05/31/17 1748 06/01/17 0329  TROPONINI 0.35* 0.27* 0.23*   BNP (last 3 results) No results for input(s): PROBNP in  the last 8760 hours. HbA1C: No results for input(s): HGBA1C in the last 72 hours. CBG: No results for input(s): GLUCAP in the last 168 hours. Lipid Profile: No results for input(s): CHOL, HDL, LDLCALC, TRIG, CHOLHDL, LDLDIRECT in the last 72 hours. Thyroid Function Tests: No results for input(s): TSH, T4TOTAL, FREET4, T3FREE, THYROIDAB in the last 72 hours. Anemia Panel: No results for input(s): VITAMINB12, FOLATE, FERRITIN, TIBC, IRON, RETICCTPCT in the last 72 hours. Sepsis Labs: No results for input(s): PROCALCITON, LATICACIDVEN in the last 168 hours.  Recent Results (from the past 240 hour(s))  Surgical PCR screen     Status: None   Collection Time: 06/03/17  8:39 AM  Result Value Ref Range Status   MRSA, PCR NEGATIVE NEGATIVE Final   Staphylococcus aureus NEGATIVE NEGATIVE Final    Comment: (NOTE) The Xpert SA Assay (FDA approved for NASAL specimens in patients 42 years of age and older), is one component of a comprehensive surveillance program. It is not intended to diagnose infection nor to guide or monitor treatment.        Radiology Studies: No results found.    Scheduled Meds: . calcitRIOL  0.75 mcg Oral Q M,W,F-HD  . cinacalcet  120 mg Oral Q M,W,F-HD  . darbepoetin (ARANESP) injection - DIALYSIS  150 mcg Intravenous Q Fri-HD  . gabapentin  100 mg Oral BID  . nicotine  21 mg Transdermal Daily  . sevelamer carbonate  3,200 mg Oral TID  WC  . sodium chloride flush  3 mL Intravenous Q12H   Continuous Infusions: . sodium chloride Stopped (06/03/17 1209)  . sodium chloride    . sodium chloride    . sodium chloride 10 mL/hr at 06/01/17 1715  . sodium chloride 10 mL/hr at 06/03/17 0924     LOS: 6 days    Time spent: 30 minutes   Dessa Phi, DO [facility] [email or web address] Password [email or web address] 06/04/2017, 1:34 PM

## 2017-06-04 NOTE — Progress Notes (Signed)
VASCULAR SURGERY:   The patient is continued to have some oozing from the left arm wound.  The patient's platelet count is 62,000.  He is to receive another pack of platelets.  His platelet count improved to 125,000 yesterday after unit of platelets.  His bleeding stopped after he received DDAVP.  He ate at 2:15 PM, a solid meal with meat.  I do not think at this point that we need to take him to the operating room urgently so we will plan on making him n.p.o. after midnight and plan on exploring the arm tomorrow.  He will likely need platelets preoperatively. I will check his platelet count early in the morning.  [name], MD, [place] [phone] Office: [phone]

## 2017-06-04 NOTE — Progress Notes (Signed)
Pt back from dialysis. L arm dressing dry clean and intact still. VSS. C/o 10/10 L arm pain, PRN medicine given. Snacks provided. Still refuses tele, ripped off wires and stickers himself. Will continue to monitor.

## 2017-06-04 NOTE — H&P (View-Only) (Signed)
VASCULAR SURGERY:   The patient is continued to have some oozing from the left arm wound.  The patient's platelet count is 62,000.  He is to receive another pack of platelets.  His platelet count improved to 125,000 yesterday after unit of platelets.  His bleeding stopped after he received DDAVP.  He ate at 2:15 PM, a solid meal with meat.  I do not think at this point that we need to take him to the operating room urgently so we will plan on making him n.p.o. after midnight and plan on exploring the arm tomorrow.  He will likely need platelets preoperatively. I will check his platelet count early in the morning.  [name], MD, [place] [phone] Office: [phone]

## 2017-06-04 NOTE — Progress Notes (Addendum)
Progress Note    06/04/2017 9:32 AM 1 Day Post-Op  Subjective:  No complaints  Afebrile HR 80's-110's NSR/ST 676'P-950'D systolic 326% RA  Vitals:   06/04/17 0132 06/04/17 0535  BP: 129/79 (!) 116/56  Pulse: 94 (!) 111  Resp: (!) 23 16  Temp: 97.6 F (36.4 C) 97.8 F (36.6 C)  SpO2: 100% 100%    Physical Exam: Cardiac:  regular Lungs:  Non labored Incisions:  LUA wound with clot; small continuous ooze from distal incision. Extremities:  Easily palpable left radial pulse.   CBC    Component Value Date/Time   WBC 11.4 (H) 06/04/2017 0332   RBC 2.20 (L) 06/04/2017 0332   HGB 6.7 (LL) 06/04/2017 0332   HCT 18.8 (L) 06/04/2017 0332   PLT 62 (L) 06/04/2017 0332   MCV 85.5 06/04/2017 0332   MCH 30.5 06/04/2017 0332   MCHC 35.6 06/04/2017 0332   RDW 17.6 (H) 06/04/2017 0332   LYMPHSABS 1.4 06/04/2017 0332   MONOABS 1.2 (H) 06/04/2017 0332   EOSABS 0.0 06/04/2017 0332   BASOSABS 0.0 06/04/2017 0332    BMET    Component Value Date/Time   NA 132 (L) 06/04/2017 0332   K 4.1 06/04/2017 0332   CL 94 (L) 06/04/2017 0332   CO2 28 06/04/2017 0332   GLUCOSE 164 (H) 06/04/2017 0332   BUN 23 (H) 06/04/2017 0332   CREATININE 4.51 (H) 06/04/2017 0332   CREATININE 9.18 (H) 02/15/2017 1425   CALCIUM 8.4 (L) 06/04/2017 0332   CALCIUM 8.1 (L) 07/11/2011 1117   GFRNONAA 14 (L) 06/04/2017 0332   GFRNONAA 6 (L) 02/15/2017 1425   GFRAA 16 (L) 06/04/2017 0332   GFRAA 7 (L) 02/15/2017 1425    INR    Component Value Date/Time   INR 1.17 06/03/2017 1911     Intake/Output Summary (Last 24 hours) at 06/04/2017 0932 Last data filed at 06/04/2017 0300 Gross per 24 hour  Intake 1577 ml  Output 4608 ml  Net -3031 ml     Assessment:  56 y.o. male is s/p:  1.  Exploration of left upper arm 2.  Ligation of central portion of left upper arm fistula 3.  Ligation of proximal aspect of left upper arm fistula 4.  Evacuation of hematoma 5.  Placement of VAC  1 Day  Post-Op And Incision and drainage of left arm hematoma with ligation of left arm AV fistula 3 Days Post-Op   Plan: -dressing removed and clot removed from the wound. Pt tolerated well overall.  Overall, bleeding is improved.  There is one area distally that has a small ooze.  Wound packed with wet kerlix and dressed.   -pt has eaten this morning.  Will keep pt npo after MN and possible wound exploration in the morning.  If platelets corrected, there is a chance he may not need wound exploration tomorrow.  Will recheck in the am. -thrombocytopenia this am of 62k-It is thought this could possibly be related to chronic hepatitis--may benefit from Hematology consult. -leukocytosis improved this am  -DVT prophylaxis:  None due to high risk of bleeding -acute blood loss anemia-transfuse per primary team.   Leontine Locket, PA-C [facility] [phone] 06/04/2017 9:32 AM  I have interviewed the patient and examined the patient. I agree with the findings by the PA. Patient still has significant thrombocytopenia.  Given his issues with bleeding I suspect he will need more platelets. Management of his wound will be a significant challenge.  Gae Gallop,  MD [phone]

## 2017-06-04 NOTE — Progress Notes (Signed)
HD tx initiated via HD cath w/o problem, pull/push/flush equally w/o problem, VSS, will be getting 2 units of PRBC on HD tx, will cont to monitor while on HD tx

## 2017-06-04 NOTE — Progress Notes (Signed)
HD tx completed @ 0125 w/o problem, 2 units PRBC admin on HD tx w/ s/s of reaction, UF goal met, blood rinsed back, VSS, report called to Ladene Artist, RN

## 2017-06-04 NOTE — Progress Notes (Signed)
Patient left arm continues to bleed, he has saturated 2 dressings within 2 hours. Notified provider.

## 2017-06-04 NOTE — Progress Notes (Signed)
[facility] Progress Note   Subjective: s/p OR to control bleeding - HD in the middle of the night- removed 4 liters - gave 2 units PRBCs- hgb 6.7 this AM- I guess wound vac wasn't working- wants to go home over weekend- dont see it happening  Objective Vitals:   06/04/17 0100 06/04/17 0125 06/04/17 0132 06/04/17 0535  BP: (!) 143/69 (!) 150/73 129/79 (!) 116/56  Pulse: 87 91 94 (!) 111  Resp: 15 15 (!) 23 16  Temp:   97.6 F (36.4 C) 97.8 F (36.6 C)  TempSrc:   Axillary Axillary  SpO2: 100% 100% 100% 100%  Weight:   78.1 kg (172 lb 2.9 oz)   Height:       Physical Exam General: Awake, a little drugged up  Heart: S1,S2 RRR HR 105-110 Lungs: Bilateral breath sounds decreased in bases few scattered coarse breath sounds. No WOB.  Abdomen: Active BS. Extremities: Trace pre tib edema.  Dialysis Access: RIJ TDC drsg intact. Big dressing left upper arm   Additional Objective Labs: Basic Metabolic Panel: Recent Labs  Lab 06/03/17 0243 06/03/17 0921 06/03/17 1502 06/04/17 0332  NA 128* 126* 129* 132*  K 5.6* 6.0* 6.8* 4.1  CL 91*  --  91* 94*  CO2 27  --  27 28  GLUCOSE 106* 83 184* 164*  BUN 46*  --  50* 23*  CREATININE 7.49*  --  8.02* 4.51*  CALCIUM 8.7*  --  9.3 8.4*   Liver Function Tests: Recent Labs  Lab 05/29/17 1944 06/01/17 0329  AST 64* 36  ALT 43 29  ALKPHOS 152* 82  BILITOT 1.2 1.1  PROT 8.3* 6.4*  ALBUMIN 4.2 3.5   CBC: Recent Labs  Lab 05/29/17 1944  06/02/17 0906 06/03/17 0243  06/03/17 1502 06/03/17 1911 06/04/17 0332  WBC 13.4*   < > 12.9* 8.9  --  14.1* 14.3* 11.4*  NEUTROABS 12.5*  --   --   --   --   --  12.7* 8.8*  HGB 10.3*   < > 8.5* 6.4*   < > 7.5* 6.4* 6.7*  HCT 30.5*   < > 24.1* 18.2*   < > 21.3* 18.4* 18.8*  MCV 84.5   < > 84.0 85.8  --  85.5 84.8 85.5  PLT 170   < > 61* 56*  --  115* 125* 62*   < > = values in this interval not displayed.   Blood Culture    Component Value Date/Time   SDES BLOOD RIGHT  FOREARM 02/20/2017 1715   SPECREQUEST  02/20/2017 1715    BOTTLES DRAWN AEROBIC AND ANAEROBIC Blood Culture adequate volume   CULT NO GROWTH 5 DAYS 02/20/2017 1715   REPTSTATUS 02/25/2017 FINAL 02/20/2017 1715    Cardiac Enzymes: Recent Labs  Lab 05/31/17 1211 05/31/17 1748 06/01/17 0329  TROPONINI 0.35* 0.27* 0.23*   Studies/Results: No results found. Medications: . sodium chloride Stopped (06/03/17 1209)  . sodium chloride    . sodium chloride    . sodium chloride 10 mL/hr at 06/01/17 1715  . sodium chloride 10 mL/hr at 06/03/17 0924   . calcitRIOL  0.75 mcg Oral Q M,W,F-HD  . cinacalcet  120 mg Oral Q M,W,F-HD  . darbepoetin (ARANESP) injection - DIALYSIS  150 mcg Intravenous Q Fri-HD  . gabapentin  100 mg Oral BID  . nicotine  21 mg Transdermal Daily  . sevelamer carbonate  3,200 mg Oral TID WC  . sodium chloride flush  3 mL Intravenous Q12H  . zolpidem  5 mg Oral Once     Dialysis:[facility] 4h 72.5kg 2/2 Hep 3000 LUA AVF -mircera 200 ug next 11/29 -venofer load 7 doses left 100 mg -sensipar 120 mg tiw w/ HD -left over edw last 4 HD (76 - 81kg post) -s/o early about 50% of session -Hb 9.5, tsat 20%, Ca 10.7, P 4, pth 1475   Impression: 1. Acute dyspnea - pulm edema due to missed HD and access dysfunction - resolved 2.  BP/vol- variable- very volume sensitive- has been lower than I have ever seen it earlier in hosp was on minoxidil 10 BID (probably evidence that he does not take at home ) - now on 5 BID with parameters 3. LUA AVF edema/blistering: S/P ligation of AVF/Evacuation of hematoma with wound vac 06/03/17.  AVF ligated, now with wound care per VVS and TDC 4. Hyperkalemia - due to inconsistent HD and surgery/bleeding- better this AM 5. ESRD on HD- normally MWF-  on schedule.  Next planned for Monday. Wants to change OP centers- this may hold up his discharge 6. Hepatitis B + - HD in isolation  7. Hx hep C- s/p treatment 8. Hx substance abuse-  claims abstinence 9. Anemia- hgb 8.8>>7.5>>6.7 after surg- on max mircera due 11/29- gave darbe here.  Transfused as least twice this hosp- may need more blood today ? Per VVS  10. Bones- rocatrol, renvela and sensipar- all cont from home- no recent labs 11. Thrombocytopenia- rebounded but now down again - HIT pending- no heparin with HD 12. Dispo- will work on changing centers may delay DC as noted above. CLIP process initiated.    Corliss Parish, MD 06/04/2017

## 2017-06-04 NOTE — Progress Notes (Signed)
CRITICAL VALUE ALERT  Critical Value:  Hgb 6.4  Date & Time Notied:  06/03/17 2000  Provider Notified: Scot Dock, MD  Orders Received/Actions taken: 2 units PRBCs ordered, other coag labs reported.   Pt w/ continued bleeding and dressing changed, clean dry intact. Neuro intact. Pulses intact. VSS. Pt instructed to keep arm still and propped on pillows as possible. Shortly went to dialysis and blood was given there. Will continue to monitor.

## 2017-06-04 NOTE — Progress Notes (Signed)
Patient continues to bleed from left arm and dressings are being saturated. I have done 2 dressing changes within two hours of each. Patient continues to complain of extreme pain and states current pain medication is not relieving pain. I have paged providers several times and awaiting a call back.

## 2017-06-04 NOTE — Progress Notes (Signed)
Changed dressing while pt in dialysis. Bleeding has slowed down some. VSS. Pulses and neuro intact. Receiving 2nd unit of blood.

## 2017-06-05 ENCOUNTER — Inpatient Hospital Stay (HOSPITAL_COMMUNITY): Payer: Medicare Other | Admitting: Anesthesiology

## 2017-06-05 ENCOUNTER — Encounter (HOSPITAL_COMMUNITY)
Admission: EM | Disposition: A | Discharge status: Other Acute Inpatient Hospital | Payer: Self-pay | Source: Home / Self Care | Attending: Internal Medicine

## 2017-06-05 HISTORY — PX: THROMBECTOMY W/ EMBOLECTOMY: SHX2507

## 2017-06-05 LAB — PREPARE PLATELET PHERESIS: Unit division: 0

## 2017-06-05 LAB — BPAM PLATELET PHERESIS
Blood Product Expiration Date: 201811262359
ISSUE DATE / TIME: 201811242047
Unit Type and Rh: 600

## 2017-06-05 LAB — CBC
HCT: 25.1 % — ABNORMAL LOW (ref 39.0–52.0)
Hemoglobin: 8.6 g/dL — ABNORMAL LOW (ref 13.0–17.0)
MCH: 30.3 pg (ref 26.0–34.0)
MCHC: 34.3 g/dL (ref 30.0–36.0)
MCV: 88.4 fL (ref 78.0–100.0)
Platelets: 74 10*3/uL — ABNORMAL LOW (ref 150–400)
RBC: 2.84 MIL/uL — ABNORMAL LOW (ref 4.22–5.81)
RDW: 16.6 % — ABNORMAL HIGH (ref 11.5–15.5)
WBC: 10.9 10*3/uL — ABNORMAL HIGH (ref 4.0–10.5)

## 2017-06-05 LAB — PREPARE RBC (CROSSMATCH)

## 2017-06-05 SURGERY — THROMBECTOMY ARTERIOVENOUS FISTULA
Anesthesia: General | Laterality: Left

## 2017-06-05 MED ORDER — FENTANYL CITRATE (PF) 250 MCG/5ML IJ SOLN
INTRAMUSCULAR | Status: AC
  Filled 2017-06-05: qty 5

## 2017-06-05 MED ORDER — ALBUMIN HUMAN 5 % IV SOLN
INTRAVENOUS | Status: DC | PRN
  Administered 2017-06-05 (×2): via INTRAVENOUS

## 2017-06-05 MED ORDER — SODIUM CHLORIDE 0.9 % IV SOLN
INTRAVENOUS | Status: DC | PRN
  Administered 2017-06-05: 07:00:00

## 2017-06-05 MED ORDER — HYDROMORPHONE HCL 1 MG/ML IJ SOLN
0.2500 mg | INTRAMUSCULAR | Status: DC | PRN
  Administered 2017-06-05: 0.5 mg via INTRAVENOUS

## 2017-06-05 MED ORDER — LIDOCAINE HCL (PF) 1 % IJ SOLN
INTRAMUSCULAR | Status: AC
  Filled 2017-06-05: qty 30

## 2017-06-05 MED ORDER — NEOSTIGMINE METHYLSULFATE 10 MG/10ML IV SOLN
INTRAVENOUS | Status: DC | PRN
  Administered 2017-06-05: 3 mg via INTRAVENOUS

## 2017-06-05 MED ORDER — FENTANYL CITRATE (PF) 100 MCG/2ML IJ SOLN
INTRAMUSCULAR | Status: DC | PRN
  Administered 2017-06-05 (×2): 25 ug via INTRAVENOUS
  Administered 2017-06-05 (×3): 50 ug via INTRAVENOUS

## 2017-06-05 MED ORDER — PHENYLEPHRINE HCL 10 MG/ML IJ SOLN
INTRAVENOUS | Status: DC | PRN
  Administered 2017-06-05: 50 ug/min via INTRAVENOUS

## 2017-06-05 MED ORDER — PROPOFOL 10 MG/ML IV BOLUS
INTRAVENOUS | Status: DC | PRN
  Administered 2017-06-05: 200 mg via INTRAVENOUS

## 2017-06-05 MED ORDER — GLYCOPYRROLATE 0.2 MG/ML IJ SOLN
INTRAMUSCULAR | Status: DC | PRN
  Administered 2017-06-05: 0.4 mg via INTRAVENOUS

## 2017-06-05 MED ORDER — SODIUM CHLORIDE 0.9 % IR SOLN
Status: DC | PRN
  Administered 2017-06-05: 1

## 2017-06-05 MED ORDER — MIDAZOLAM HCL 5 MG/5ML IJ SOLN
INTRAMUSCULAR | Status: DC | PRN
  Administered 2017-06-05 (×2): 1 mg via INTRAVENOUS

## 2017-06-05 MED ORDER — HEMOSTATIC AGENTS (NO CHARGE) OPTIME
TOPICAL | Status: DC | PRN
  Administered 2017-06-05: 1 via TOPICAL

## 2017-06-05 MED ORDER — SODIUM CHLORIDE 0.9 % IV SOLN
20.0000 ug | Freq: Once | INTRAVENOUS | Status: AC
  Administered 2017-06-05: 20 ug via INTRAVENOUS
  Filled 2017-06-05: qty 5

## 2017-06-05 MED ORDER — MIDAZOLAM HCL 2 MG/2ML IJ SOLN
INTRAMUSCULAR | Status: AC
Start: 2017-06-05 — End: 2017-06-05
  Filled 2017-06-05: qty 2

## 2017-06-05 MED ORDER — LIDOCAINE HCL (CARDIAC) 20 MG/ML IV SOLN
INTRAVENOUS | Status: DC | PRN
  Administered 2017-06-05: 60 mg via INTRAVENOUS

## 2017-06-05 MED ORDER — CISATRACURIUM BESYLATE (PF) 10 MG/5ML IV SOLN
INTRAVENOUS | Status: DC | PRN
  Administered 2017-06-05: 14 mg via INTRAVENOUS

## 2017-06-05 MED ORDER — HYDROMORPHONE HCL 1 MG/ML IJ SOLN
INTRAMUSCULAR | Status: AC
  Filled 2017-06-05: qty 1

## 2017-06-05 MED ORDER — SODIUM CHLORIDE 0.9 % IV SOLN
INTRAVENOUS | Status: DC | PRN
  Administered 2017-06-05: 07:00:00 via INTRAVENOUS

## 2017-06-05 MED ORDER — ONDANSETRON HCL 4 MG/2ML IJ SOLN
INTRAMUSCULAR | Status: DC | PRN
  Administered 2017-06-05: 4 mg via INTRAVENOUS

## 2017-06-05 MED ORDER — PROPOFOL 10 MG/ML IV BOLUS
INTRAVENOUS | Status: AC
  Filled 2017-06-05: qty 20

## 2017-06-05 MED ORDER — PHENYLEPHRINE HCL 10 MG/ML IJ SOLN
INTRAMUSCULAR | Status: DC | PRN
  Administered 2017-06-05 (×2): 80 ug via INTRAVENOUS

## 2017-06-05 MED ORDER — LIDOCAINE-EPINEPHRINE (PF) 1 %-1:200000 IJ SOLN
INTRAMUSCULAR | Status: AC
  Filled 2017-06-05: qty 30

## 2017-06-05 SURGICAL SUPPLY — 40 items
ADH SKN CLS APL DERMABOND .7 (GAUZE/BANDAGES/DRESSINGS) ×1
ADH SRG 12 PREFL SYR 3 SPRDR (MISCELLANEOUS) ×1
ARMBAND PINK RESTRICT EXTREMIT (MISCELLANEOUS) ×4 IMPLANT
BANDAGE ACE 4X5 VEL STRL LF (GAUZE/BANDAGES/DRESSINGS) ×1 IMPLANT
BNDG GAUZE ELAST 4 BULKY (GAUZE/BANDAGES/DRESSINGS) ×1 IMPLANT
CANISTER SUCT 3000ML PPV (MISCELLANEOUS) ×2 IMPLANT
CANNULA VESSEL 3MM 2 BLNT TIP (CANNULA) ×1 IMPLANT
CATH EMB 4FR 80CM (CATHETERS) ×2 IMPLANT
CLIP VESOCCLUDE MED 6/CT (CLIP) ×2 IMPLANT
CLIP VESOCCLUDE SM WIDE 6/CT (CLIP) ×2 IMPLANT
DERMABOND ADVANCED (GAUZE/BANDAGES/DRESSINGS) ×1
DERMABOND ADVANCED .7 DNX12 (GAUZE/BANDAGES/DRESSINGS) ×1 IMPLANT
DRSG PAD ABDOMINAL 8X10 ST (GAUZE/BANDAGES/DRESSINGS) ×1 IMPLANT
ELECT REM PT RETURN 9FT ADLT (ELECTROSURGICAL) ×2
ELECTRODE REM PT RTRN 9FT ADLT (ELECTROSURGICAL) ×1 IMPLANT
GAUZE SPONGE 4X4 12PLY STRL LF (GAUZE/BANDAGES/DRESSINGS) ×1 IMPLANT
GAUZE XEROFORM 5X9 LF (GAUZE/BANDAGES/DRESSINGS) ×1 IMPLANT
GLOVE BIO SURGEON STRL SZ7.5 (GLOVE) ×2 IMPLANT
GLOVE BIOGEL PI IND STRL 7.0 (GLOVE) IMPLANT
GLOVE BIOGEL PI IND STRL 8 (GLOVE) ×1 IMPLANT
GLOVE BIOGEL PI INDICATOR 7.0 (GLOVE) ×2
GLOVE BIOGEL PI INDICATOR 8 (GLOVE) ×1
GLOVE SURG SS PI 6.5 STRL IVOR (GLOVE) ×1 IMPLANT
GOWN STRL REUS W/ TWL LRG LVL3 (GOWN DISPOSABLE) ×3 IMPLANT
GOWN STRL REUS W/TWL LRG LVL3 (GOWN DISPOSABLE) ×6
KIT BASIN OR (CUSTOM PROCEDURE TRAY) ×2 IMPLANT
KIT ROOM TURNOVER OR (KITS) ×2 IMPLANT
NS IRRIG 1000ML POUR BTL (IV SOLUTION) ×2 IMPLANT
PACK CV ACCESS (CUSTOM PROCEDURE TRAY) ×2 IMPLANT
PAD ARMBOARD 7.5X6 YLW CONV (MISCELLANEOUS) ×4 IMPLANT
SPONGE LAP 18X18 X RAY DECT (DISPOSABLE) ×1 IMPLANT
SUT ETHILON 3 0 PS 1 (SUTURE) ×5 IMPLANT
SUT PROLENE 5 0 C 1 24 (SUTURE) ×2 IMPLANT
SUT PROLENE 6 0 BV (SUTURE) ×2 IMPLANT
SUT VIC AB 3-0 SH 27 (SUTURE) ×2
SUT VIC AB 3-0 SH 27X BRD (SUTURE) ×1 IMPLANT
SUT VICRYL 4-0 PS2 18IN ABS (SUTURE) ×2 IMPLANT
SYR 10ML KIT SKIN ADHESIVE (MISCELLANEOUS) ×1 IMPLANT
UNDERPAD 30X30 (UNDERPADS AND DIAPERS) ×2 IMPLANT
WATER STERILE IRR 1000ML POUR (IV SOLUTION) ×2 IMPLANT

## 2017-06-05 NOTE — OR Nursing (Signed)
Lab at bedside to draw CBC.  A-line in place, per lab, CBC can be drawn by floor RN 2 hours post-infusion.

## 2017-06-05 NOTE — Progress Notes (Signed)
PROGRESS NOTE    [name]  FKC:[id number] DOB: 06/13/1961 DOA: 05/29/2017 PCP: [name], MD     Brief Narrative:  [name] is a 56 yo male with past medical history of hypertension, cocaine abuse, hepatitis B/C, COPD, chronic systolic heart failure, ESRD on dialysis who presented with sudden swelling in LUE. He had a left brachiocephalic AVF shuntogram on 05/26/2017 which noted a patent fistula with >33mm small pseudoaneurysm proximally and moderate bilobe pseudoaneurysm in the mid segment and moderate sized broad pseudoaneurysm distally. He had a tunneled dialysis catheter placed by vascular surgery on 11/19.  He was taken to the OR on 11/21 for evacuation of hematoma of the left arm, then again 11/23 for exploration of left upper arm, ligation of fistula, placement of VAC.    Assessment & Plan:   Principal Problem:   Dyspnea Active Problems:   Chronic hepatitis C without hepatic coma (HCC)   Essential hypertension   COPD (chronic obstructive pulmonary disease) (HCC)   Hyperkalemia   Tachycardia  Acute pulmonary edema -Now room air. Resolved  LUE AVF infiltration, hematoma  -Vascular surgery following -S/p evacuation left arm hematoma with ligation left arm AV fistula 11/21. Transfused 4u pRBC   -S/p evacuation left arm hematoma with ligation and placement of VAC 11/23. Transfused 3u pRBC, 1 pack platelets -VAC removed overnight 11/23 due to continued bleed  -Transfused 2u pRBC 11/24, 1 pack platelets  -S/p exploration left arm wound for bleeding with ligation of left brachiocephalic fistula at proximal end 11/25. Transfused 2u pRBC.   Acute blood loss anemia on chronic anemia of CKD -Baseline Hgb ~9-10 -Continue to trend CBC. Transfusions as above   Acute on chronic thrombocytopenia -Plt 100-->61-->56. Spoke with vascular PA, ?HIT. HIT-Ab ordered by vascular surgery. Transfusions as above  -In reviewing patient's previous lab work, he has had  intermittently fluctuating platelet of 50s-80s over the past year. GI notes possibly related to chronic hepatitis  -Ordered pathology review smear   Mildly elevated troponin  -Peak 0.35 likely secondary to demand ischemia -Had left heart cath with stenting high-grade prox RCA stenosis 02/22/17 and plavix for 6 months  Hx A Fib/flutter with aberrancy -S/p DCCV 03/24/17. Coumadin discontinued as patient noncompliant  ESRD on HD  -Nephrology following -In process of changing HD centers at patient request. CLIP in process   Hepatitis B and C Substance abuse  -Noted   DVT prophylaxis: SCD Code Status: Full Family Communication: No family at bedside Disposition Plan: Pending improvement    Consultants:   Nephrology  Vascular surgery  Antimicrobials:  Anti-infectives (From admission, onward)   Start     Dose/Rate Route Frequency Ordered Stop   06/03/17 0800  ceFAZolin (ANCEF) IVPB 2g/100 mL premix    Comments:  Send with pt to OR   2 g 200 mL/hr over 30 Minutes Intravenous To ShortStay Surgical 06/03/17 0752 06/03/17 1033   05/30/17 1530  cefUROXime (ZINACEF) 1.5 g in dextrose 5 % 50 mL IVPB     1.5 g 100 mL/hr over 30 Minutes Intravenous To ShortStay Surgical 05/30/17 1445 05/30/17 1721   05/30/17 1015  ceFAZolin (ANCEF) IVPB 2g/100 mL premix  Status:  Discontinued     2 g 200 mL/hr over 30 Minutes Intravenous To Radiology 05/30/17 1014 05/30/17 1458       Subjective: States he feels sick due to being hungry.  No shortness of breath or chest pain.  No other physical complaints aside from hunger  Objective: Vitals:  06/05/17 1015 06/05/17 1030 06/05/17 1106 06/05/17 1233  BP: 130/84  119/73 123/82  Pulse: 97 97 (!) 103 (!) 102  Resp: 11 15 (!) 9 16  Temp:  98.2 F (36.8 C)  (!) 97.4 F (36.3 C)  TempSrc:    Oral  SpO2: 100% 100% 98% 98%  Weight:      Height:        Intake/Output Summary (Last 24 hours) at 06/05/2017 1238 Last data filed at 06/05/2017  0944 Gross per 24 hour  Intake 2727.83 ml  Output 150 ml  Net 2577.83 ml   Filed Weights   06/03/17 2115 06/04/17 0132 06/05/17 0452  Weight: 82.1 kg (180 lb 16 oz) 78.1 kg (172 lb 2.9 oz) 81.9 kg (180 lb 8 oz)    Examination:  General exam: Appears calm and comfortable  Respiratory system: Clear to auscultation. Respiratory effort normal. On room air  Cardiovascular system: S1 & S2 heard, tachycardic, regular rhythm. No JVD, murmurs, rubs, gallops or clicks. Trace pedal edema. Gastrointestinal system: Abdomen is nondistended, soft and nontender. No organomegaly or masses felt. Normal bowel sounds heard. Central nervous system: Alert and oriented. No focal neurological deficits. Extremities: Symmetric, +LUE with pressure dressing in place Psychiatry: Judgement and insight appear stable   Data Reviewed: I have personally reviewed following labs and imaging studies  CBC: Recent Labs  Lab 05/29/17 1944  06/03/17 0243 06/03/17 0921 06/03/17 1502 06/03/17 1911 06/04/17 0332 06/04/17 2306  WBC 13.4*   < > 8.9  --  14.1* 14.3* 11.4* 11.2*  NEUTROABS 12.5*  --   --   --   --  12.7* 8.8* 7.7  HGB 10.3*   < > 6.4* 7.1* 7.5* 6.4* 6.7* 6.7*  HCT 30.5*   < > 18.2* 21.0* 21.3* 18.4* 18.8* 19.3*  MCV 84.5   < > 85.8  --  85.5 84.8 85.5 86.5  PLT 170   < > 56*  --  115* 125* 62* 71*   < > = values in this interval not displayed.   Basic Metabolic Panel: Recent Labs  Lab 06/02/17 0906 06/03/17 0243 06/03/17 0921 06/03/17 1502 06/04/17 0332 06/04/17 2306  NA 127* 128* 126* 129* 132* 130*  K 5.4* 5.6* 6.0* 6.8* 4.1 4.4  CL 88* 91*  --  91* 94* 92*  CO2 27 27  --  27 28 28   GLUCOSE 94 106* 83 184* 164* 118*  BUN 38* 46*  --  50* 23* 31*  CREATININE 6.36* 7.49*  --  8.02* 4.51* 5.77*  CALCIUM 8.9 8.7*  --  9.3 8.4* 9.1   GFR: Estimated Creatinine Clearance: 14.6 mL/min (A) (by C-G formula based on SCr of 5.77 mg/dL (H)). Liver Function Tests: Recent Labs  Lab 05/29/17 1944  06/01/17 0329 06/04/17 1518  AST 64* 36 38  ALT 43 29 21  ALKPHOS 152* 82 96  BILITOT 1.2 1.1 1.0  PROT 8.3* 6.4* 5.2*  ALBUMIN 4.2 3.5 2.9*   No results for input(s): LIPASE, AMYLASE in the last 168 hours. No results for input(s): AMMONIA in the last 168 hours. Coagulation Profile: Recent Labs  Lab 06/02/17 0906 06/03/17 1911 06/04/17 2306  INR 1.14 1.17 1.20   Cardiac Enzymes: Recent Labs  Lab 05/31/17 1211 05/31/17 1748 06/01/17 0329  TROPONINI 0.35* 0.27* 0.23*   BNP (last 3 results) No results for input(s): PROBNP in the last 8760 hours. HbA1C: No results for input(s): HGBA1C in the last 72 hours. CBG: No results for input(s): GLUCAP  in the last 168 hours. Lipid Profile: No results for input(s): CHOL, HDL, LDLCALC, TRIG, CHOLHDL, LDLDIRECT in the last 72 hours. Thyroid Function Tests: No results for input(s): TSH, T4TOTAL, FREET4, T3FREE, THYROIDAB in the last 72 hours. Anemia Panel: No results for input(s): VITAMINB12, FOLATE, FERRITIN, TIBC, IRON, RETICCTPCT in the last 72 hours. Sepsis Labs: No results for input(s): PROCALCITON, LATICACIDVEN in the last 168 hours.  Recent Results (from the past 240 hour(s))  Surgical PCR screen     Status: None   Collection Time: 06/03/17  8:39 AM  Result Value Ref Range Status   MRSA, PCR NEGATIVE NEGATIVE Final   Staphylococcus aureus NEGATIVE NEGATIVE Final    Comment: (NOTE) The Xpert SA Assay (FDA approved for NASAL specimens in patients 35 years of age and older), is one component of a comprehensive surveillance program. It is not intended to diagnose infection nor to guide or monitor treatment.        Radiology Studies: No results found.    Scheduled Meds: . calcitRIOL  0.75 mcg Oral Q M,W,F-HD  . cinacalcet  120 mg Oral Q M,W,F-HD  . darbepoetin (ARANESP) injection - DIALYSIS  150 mcg Intravenous Q Fri-HD  . gabapentin  100 mg Oral BID  . HYDROmorphone      . nicotine  21 mg Transdermal Daily  .  sevelamer carbonate  3,200 mg Oral TID WC  . sodium chloride flush  3 mL Intravenous Q12H   Continuous Infusions: . sodium chloride Stopped (06/03/17 1209)  . sodium chloride    . sodium chloride    . sodium chloride 10 mL/hr at 06/01/17 1715  . sodium chloride 10 mL/hr at 06/03/17 0924     LOS: 7 days    Time spent: 30 minutes   Dessa Phi, DO [facility] [email or web address] Password [facility] 06/05/2017, 12:38 PM

## 2017-06-05 NOTE — Transfer of Care (Signed)
Immediate Anesthesia Transfer of Care Note  Patient: [name]  Procedure(s) Performed: EXPLORATION OF LEFT ARM FOR BLEEDING; OVERSEWED PROXIMAL FISTULA (Left )  Patient Location: PACU  Anesthesia Type:General  Level of Consciousness: awake and drowsy  Airway & Oxygen Therapy: Patient Spontanous Breathing  Post-op Assessment: Report given to RN and Post -op Vital signs reviewed and stable  Post vital signs: Reviewed and stable  Last Vitals:  Vitals:   06/05/17 0011 06/05/17 0452  BP: 109/70 (!) 127/116  Pulse: 93 88  Resp: 17 19  Temp: 36.5 C 36.4 C  SpO2: 98% 100%    Last Pain:  Vitals:   06/05/17 0452  TempSrc: Oral  PainSc:       Patients Stated Pain Goal: 2 (06/77/03 4035)  Complications: No apparent anesthesia complications

## 2017-06-05 NOTE — Interval H&P Note (Signed)
History and Physical Interval Note:  06/05/2017 5:26 AM  [name]  has presented today for surgery, with the diagnosis of BLEEDING LEFT ARM  The various methods of treatment have been discussed with the patient and family. After consideration of risks, benefits and other options for treatment, the patient has consented to  Procedure(s): EXPLORATION OF LEFT ARM FOR BLEEDING (Left) as a surgical intervention .  The patient's history has been reviewed, patient examined, no change in status, stable for surgery.  I have reviewed the patient's chart and labs.  Questions were answered to the patient's satisfaction.     [name]

## 2017-06-05 NOTE — Interval H&P Note (Signed)
History and Physical Interval Note:  06/05/2017 7:19 AM  [name]  has presented today for surgery, with the diagnosis of BLEEDING LEFT ARM  The various methods of treatment have been discussed with the patient and family. After consideration of risks, benefits and other options for treatment, the patient has consented to  Procedure(s): EXPLORATION OF LEFT ARM FOR BLEEDING (Left) as a surgical intervention .  The patient's history has been reviewed, patient examined, no change in status, stable for surgery.  I have reviewed the patient's chart and labs.  Questions were answered to the patient's satisfaction.     [name]

## 2017-06-05 NOTE — Op Note (Signed)
    NAME: [name]    MRN: [id number] DOB: Nov 12, 1960    DATE OF OPERATION: 06/05/2017  PREOP DIAGNOSIS:    Bleeding status post ligation of left upper arm fistula  POSTOP DIAGNOSIS:    Same  PROCEDURE:    Exploration of left upper arm wound for bleeding Ligation of left brachiocephalic fistula at proximal end  SURGEON: Judeth Cornfield. Scot Dock, MD, FACS  ASSIST: None  ANESTHESIA: General  EBL: 50 cc  INDICATIONS:    [name] is a 56 y.o. male who is had problems with bleeding where he had a fistula in his upper arm ligated.  He has significant thrombocytopenia.  He is unable to tolerate dressing changes the bedside and therefore was brought to the operating room for exploration.  FINDINGS:   The proximal fistula had been ligated with 2 2-0 silk ties and there was some intermittent bleeding from this so this was oversewn with a 5-0 Prolene.  There was generalized oozing but no other specific site was found.  TECHNIQUE:   The patient was taken to the operating room and received a general anesthetic.  Left upper extremity was prepped and draped in the usual sterile fashion.  The previous incision was opened after the sutures were removed.  Large amount of clot was evacuated.  There was some generalized oozing which was cauterized.  I placed BioGlue in the upper part of the wound and held pressure and this did seem to be helpful.  At the proximal end there was some intermittent bleeding from the proximal fistula where this had been doubly ligated with a silk tie.  I oversewed this with a 5-0 Prolene.  I then applied more BioGlue to the raw surfaces.  The wound was closed at each end with interrupted 3-0 Vicryls until there was too much tension to close this any further.  A sterile dressing was then applied.  The patient tolerated the procedure well was transferred to the recovery room in stable condition.  All needle and sponge counts were correct.  [name],  MD, FACS [facility]  DATE OF DICTATION:   06/05/2017

## 2017-06-05 NOTE — Anesthesia Procedure Notes (Signed)
Procedure Name: Intubation Date/Time: 06/05/2017 7:59 AM Performed by: Clearnce Sorrel, CRNA Pre-anesthesia Checklist: Patient identified, Emergency Drugs available, Suction available, Patient being monitored and Timeout performed Patient Re-evaluated:Patient Re-evaluated prior to induction Oxygen Delivery Method: Circle system utilized Preoxygenation: Pre-oxygenation with 100% oxygen Induction Type: IV induction Ventilation: Mask ventilation without difficulty and Oral airway inserted - appropriate to patient size Laryngoscope Size: Mac and 4 Grade View: Grade I Tube type: Oral Tube size: 7.5 mm Number of attempts: 1 Airway Equipment and Method: Stylet Placement Confirmation: ETT inserted through vocal cords under direct vision,  positive ETCO2 and breath sounds checked- equal and bilateral Secured at: 22 cm Tube secured with: Tape Dental Injury: Teeth and Oropharynx as per pre-operative assessment

## 2017-06-05 NOTE — Progress Notes (Signed)
Patient arrived from PACU to 4e17 patient vital signs obtained and placed on telemetry. Bed in lowest position will monitor patient. Hennie Gosa, Bettina Gavia rN

## 2017-06-05 NOTE — OR Nursing (Signed)
Dr. Ola Spurr at bedside.  States to leave right radial a-line in place if 4E accepts a-lines, if not, then remove a-line.  Will confirm 4E accepts a-line and if not will remove prior to d/c from PACU.

## 2017-06-05 NOTE — Anesthesia Preprocedure Evaluation (Addendum)
Anesthesia Evaluation  Patient identified by MRN, date of birth, ID band Patient awake    Reviewed: Allergy & Precautions, H&P , NPO status , Patient's Chart, lab work & pertinent test results  Airway Mallampati: III  TM Distance: >3 FB Neck ROM: Full    Dental no notable dental hx. (+) Teeth Intact, Dental Advisory Given   Pulmonary COPD,  COPD inhaler, Current Smoker,    Pulmonary exam normal breath sounds clear to auscultation       Cardiovascular hypertension, Pt. on medications + Peripheral Vascular Disease  + Valvular Problems/Murmurs  Rhythm:Regular Rate:Normal     Neuro/Psych Anxiety negative neurological ROS     GI/Hepatic GERD  ,(+) Hepatitis -, B, C  Endo/Other  negative endocrine ROS  Renal/GU ESRF and DialysisRenal disease  negative genitourinary   Musculoskeletal  (+) Arthritis , Osteoarthritis,    Abdominal   Peds  Hematology negative hematology ROS (+) anemia ,   Anesthesia Other Findings   Reproductive/Obstetrics negative OB ROS                            Anesthesia Physical Anesthesia Plan  ASA: III and emergent  Anesthesia Plan: General   Post-op Pain Management:    Induction: Intravenous, Rapid sequence and Cricoid pressure planned  PONV Risk Score and Plan: 2 and Ondansetron, Dexamethasone and Midazolam  Airway Management Planned: Oral ETT  Additional Equipment:   Intra-op Plan:   Post-operative Plan: Extubation in OR  Informed Consent: I have reviewed the patients History and Physical, chart, labs and discussed the procedure including the risks, benefits and alternatives for the proposed anesthesia with the patient or authorized representative who has indicated his/her understanding and acceptance.   Dental advisory given  Plan Discussed with: CRNA  Anesthesia Plan Comments:         Anesthesia Quick Evaluation

## 2017-06-05 NOTE — Progress Notes (Signed)
[facility] Progress Note   Subjective:  to OR AGAIN to control bleeding this AM -   Objective Vitals:   06/04/17 2132 06/04/17 2257 06/05/17 0011 06/05/17 0452  BP: 136/64 124/74 109/70 (!) 127/116  Pulse: 87 95 93 88  Resp: 18 16 17 19   Temp: (!) 97.4 F (36.3 C) 97.7 F (36.5 C) 97.7 F (36.5 C) 97.6 F (36.4 C)  TempSrc: Axillary Axillary Axillary Oral  SpO2: 100% 98% 98% 100%  Weight:    81.9 kg (180 lb 8 oz)  Height:       Physical Exam General:** Not seen as currently in the OR** Heart: S1,S2 RRR  Lungs: Bilateral breath sounds decreased in bases few scattered coarse breath sounds. No WOB.  Abdomen: Active BS. Extremities: Trace pre tib edema.  Dialysis Access: RIJ TDC drsg intact. Big dressing left upper arm   Additional Objective Labs: Basic Metabolic Panel: Recent Labs  Lab 06/03/17 1502 06/04/17 0332 06/04/17 2306  NA 129* 132* 130*  K 6.8* 4.1 4.4  CL 91* 94* 92*  CO2 27 28 28   GLUCOSE 184* 164* 118*  BUN 50* 23* 31*  CREATININE 8.02* 4.51* 5.77*  CALCIUM 9.3 8.4* 9.1   Liver Function Tests: Recent Labs  Lab 05/29/17 1944 06/01/17 0329 06/04/17 1518  AST 64* 36 38  ALT 43 29 21  ALKPHOS 152* 82 96  BILITOT 1.2 1.1 1.0  PROT 8.3* 6.4* 5.2*  ALBUMIN 4.2 3.5 2.9*   CBC: Recent Labs  Lab 06/03/17 0243  06/03/17 1502 06/03/17 1911 06/04/17 0332 06/04/17 2306  WBC 8.9  --  14.1* 14.3* 11.4* 11.2*  NEUTROABS  --   --   --  12.7* 8.8* 7.7  HGB 6.4*   < > 7.5* 6.4* 6.7* 6.7*  HCT 18.2*   < > 21.3* 18.4* 18.8* 19.3*  MCV 85.8  --  85.5 84.8 85.5 86.5  PLT 56*  --  115* 125* 62* 71*   < > = values in this interval not displayed.   Blood Culture    Component Value Date/Time   SDES BLOOD RIGHT FOREARM 02/20/2017 1715   SPECREQUEST  02/20/2017 1715    BOTTLES DRAWN AEROBIC AND ANAEROBIC Blood Culture adequate volume   CULT NO GROWTH 5 DAYS 02/20/2017 1715   REPTSTATUS 02/25/2017 FINAL 02/20/2017 1715    Cardiac  Enzymes: Recent Labs  Lab 05/31/17 1211 05/31/17 1748 06/01/17 0329  TROPONINI 0.35* 0.27* 0.23*   Studies/Results: No results found. Medications: . sodium chloride Stopped (06/03/17 1209)  . sodium chloride    . sodium chloride    . sodium chloride 10 mL/hr at 06/01/17 1715  . sodium chloride 10 mL/hr at 06/03/17 0924   . calcitRIOL  0.75 mcg Oral Q M,W,F-HD  . cinacalcet  120 mg Oral Q M,W,F-HD  . darbepoetin (ARANESP) injection - DIALYSIS  150 mcg Intravenous Q Fri-HD  . gabapentin  100 mg Oral BID  . nicotine  21 mg Transdermal Daily  . sevelamer carbonate  3,200 mg Oral TID WC  . sodium chloride flush  3 mL Intravenous Q12H     Dialysis:[facility] 4h 72.5kg 2/2 Hep 3000 LUA AVF -mircera 200 ug next 11/29 -venofer load 7 doses left 100 mg -sensipar 120 mg tiw w/ HD -left over edw last 4 HD (76 - 81kg post) -s/o early about 50% of session -Hb 9.5, tsat 20%, Ca 10.7, P 4, pth 1475   Impression: 1. Acute dyspnea - pulm edema due to  missed HD and access dysfunction - resolved 2.  BP/vol- variable- very volume sensitive- has been lower than I have ever seen it in hosp was on minoxidil 10 BID (probably evidence that he does not take at home ) - now on 5 BID with parameters- narcotics are probably helping keep it down as well  3. LUA AVF edema/blistering: S/P ligation of AVF/Evacuation of hematoma with wound vac 06/03/17.  AVF ligated, now with wound care per [facility]- OR times 3 and TDC 4. Hyperkalemia - due to inconsistent HD and surgery/bleeding- better last night  5. ESRD on HD- normally MWF-  on schedule.  Next planned for Monday. Wants to change OP centers- 6. Hepatitis B + - HD in isolation  7. Hx hep C- s/p treatment 8. Hx substance abuse- claims abstinence 9. Anemia- hgb 8.8>>7.5>>6.7 after surg- on max mircera due 11/29- gave darbe here.  Transfused as least twice this hosp- may need more blood - Per [facility] but will also write parameters to give blood with HD  tomorrow  10. Bones- rocatrol, renvela and sensipar- all cont from home- no recent labs 11. Thrombocytopenia- rebounded but now down again - HIT pending- no heparin with HD 12. Dispo- will work on changing centers - CLIP process initiated.    Corliss Parish, MD 06/05/2017

## 2017-06-05 NOTE — OR Nursing (Signed)
Report called to Jenny Reichmann, RN on 4E.  Confirmed 4E does accept a-line.  Will leave aline in place.

## 2017-06-05 NOTE — Anesthesia Postprocedure Evaluation (Signed)
Anesthesia Post Note  Patient: [name]  Procedure(s) Performed: EXPLORATION OF LEFT ARM FOR BLEEDING; OVERSEWED PROXIMAL FISTULA (Left )     Patient location during evaluation: PACU Anesthesia Type: General Level of consciousness: awake and alert Pain management: pain level controlled Vital Signs Assessment: post-procedure vital signs reviewed and stable Respiratory status: spontaneous breathing, nonlabored ventilation and respiratory function stable Cardiovascular status: blood pressure returned to baseline and stable Postop Assessment: no apparent nausea or vomiting Anesthetic complications: no    Last Vitals:  Vitals:   06/05/17 1015 06/05/17 1030  BP: 130/84   Pulse: 97 97  Resp: 11 15  Temp:  36.8 C  SpO2: 100% 100%    Last Pain:  Vitals:   06/05/17 1030  TempSrc:   PainSc: Asleep                 Berlinda Farve,W. EDMOND

## 2017-06-06 ENCOUNTER — Encounter (HOSPITAL_COMMUNITY): Payer: Self-pay | Admitting: Vascular Surgery

## 2017-06-06 LAB — BPAM RBC
Blood Product Expiration Date: 201811232359
Blood Product Expiration Date: 201811232359
Blood Product Expiration Date: 201811252359
Blood Product Expiration Date: 201811292359
Blood Product Expiration Date: 201811292359
Blood Product Expiration Date: 201812142359
Blood Product Expiration Date: 201812142359
Blood Product Expiration Date: 201812152359
Blood Product Expiration Date: 201812172359
Blood Product Expiration Date: 201812172359
Blood Product Expiration Date: 201812172359
ISSUE DATE / TIME: 201811211925
ISSUE DATE / TIME: 201811211925
ISSUE DATE / TIME: 201811211946
ISSUE DATE / TIME: 201811211946
ISSUE DATE / TIME: 201811230518
ISSUE DATE / TIME: 201811232107
ISSUE DATE / TIME: 201811232215
ISSUE DATE / TIME: 201811241002
ISSUE DATE / TIME: 201811241601
ISSUE DATE / TIME: 201811250859
ISSUE DATE / TIME: 201811250859
Unit Type and Rh: 5100
Unit Type and Rh: 5100
Unit Type and Rh: 5100
Unit Type and Rh: 5100
Unit Type and Rh: 9500
Unit Type and Rh: 9500
Unit Type and Rh: 9500
Unit Type and Rh: 9500
Unit Type and Rh: 9500
Unit Type and Rh: 5100
Unit Type and Rh: 5100

## 2017-06-06 LAB — CBC
HCT: 23.9 % — ABNORMAL LOW (ref 39.0–52.0)
Hemoglobin: 8.2 g/dL — ABNORMAL LOW (ref 13.0–17.0)
MCH: 30.3 pg (ref 26.0–34.0)
MCHC: 34.3 g/dL (ref 30.0–36.0)
MCV: 88.2 fL (ref 78.0–100.0)
Platelets: 79 10*3/uL — ABNORMAL LOW (ref 150–400)
RBC: 2.71 MIL/uL — ABNORMAL LOW (ref 4.22–5.81)
RDW: 17.3 % — ABNORMAL HIGH (ref 11.5–15.5)
WBC: 11.1 10*3/uL — ABNORMAL HIGH (ref 4.0–10.5)

## 2017-06-06 LAB — TYPE AND SCREEN
ABO/RH(D): O NEG
ABO/RH(D): O NEG
Antibody Screen: NEGATIVE
Antibody Screen: NEGATIVE
Unit division: 0
Unit division: 0
Unit division: 0
Unit division: 0
Unit division: 0
Unit division: 0
Unit division: 0
Unit division: 0
Unit division: 0
Unit division: 0
Unit division: 0

## 2017-06-06 LAB — BASIC METABOLIC PANEL
Anion gap: 10 (ref 5–15)
BUN: 43 mg/dL — ABNORMAL HIGH (ref 6–20)
CO2: 27 mmol/L (ref 22–32)
Calcium: 9.1 mg/dL (ref 8.9–10.3)
Chloride: 90 mmol/L — ABNORMAL LOW (ref 101–111)
Creatinine, Ser: 7.69 mg/dL — ABNORMAL HIGH (ref 0.61–1.24)
GFR calc Af Amer: 8 mL/min — ABNORMAL LOW (ref >60)
GFR calc non Af Amer: 7 mL/min — ABNORMAL LOW (ref >60)
Glucose, Bld: 100 mg/dL — ABNORMAL HIGH (ref 65–99)
Potassium: 6 mmol/L — ABNORMAL HIGH (ref 3.5–5.1)
Sodium: 127 mmol/L — ABNORMAL LOW (ref 135–145)

## 2017-06-06 MED ORDER — LIDOCAINE HCL (PF) 1 % IJ SOLN: 5.0000 mL | INTRAMUSCULAR | Status: DC | PRN

## 2017-06-06 MED ORDER — CALCITRIOL 0.5 MCG PO CAPS
ORAL_CAPSULE | ORAL | Status: AC
  Filled 2017-06-06: qty 1

## 2017-06-06 MED ORDER — ALTEPLASE 2 MG IJ SOLR: 2.0000 mg | Freq: Once | INTRAMUSCULAR | Status: DC | PRN

## 2017-06-06 MED ORDER — HEPARIN SODIUM (PORCINE) 1000 UNIT/ML DIALYSIS
1000.0000 [IU] | INTRAMUSCULAR | Status: DC | PRN
  Filled 2017-06-06: qty 1

## 2017-06-06 MED ORDER — SODIUM CHLORIDE 0.9 % IV SOLN: 100.0000 mL | INTRAVENOUS | Status: DC | PRN

## 2017-06-06 MED ORDER — DIPHENHYDRAMINE HCL 50 MG/ML IJ SOLN
INTRAMUSCULAR | Status: AC
  Administered 2017-06-06: 25 mg via INTRAVENOUS
  Filled 2017-06-06: qty 1

## 2017-06-06 MED ORDER — SIMETHICONE 80 MG PO CHEW
80.0000 mg | CHEWABLE_TABLET | Freq: Four times a day (QID) | ORAL | Status: DC | PRN
  Administered 2017-06-06: 80 mg via ORAL
  Filled 2017-06-06: qty 1

## 2017-06-06 MED ORDER — LIDOCAINE-PRILOCAINE 2.5-2.5 % EX CREA: 1.0000 "application " | TOPICAL_CREAM | CUTANEOUS | Status: DC | PRN

## 2017-06-06 MED ORDER — DIPHENHYDRAMINE HCL 50 MG/ML IJ SOLN
25.0000 mg | Freq: Once | INTRAMUSCULAR | Status: AC
  Administered 2017-06-06: 25 mg via INTRAVENOUS

## 2017-06-06 MED ORDER — PENTAFLUOROPROP-TETRAFLUOROETH EX AERO: 1.0000 "application " | INHALATION_SPRAY | CUTANEOUS | Status: DC | PRN

## 2017-06-06 MED ORDER — CALCITRIOL 0.25 MCG PO CAPS
ORAL_CAPSULE | ORAL | Status: AC
  Administered 2017-06-06: 0.75 ug via ORAL
  Filled 2017-06-06: qty 1

## 2017-06-06 MED ORDER — PROMETHAZINE HCL 25 MG/ML IJ SOLN: 12.5000 mg | Freq: Four times a day (QID) | INTRAMUSCULAR | Status: DC | PRN

## 2017-06-06 NOTE — Progress Notes (Signed)
[facility] Progress Note  Subjective: no new c/o  Vitals:   06/05/17 1945 06/05/17 2300 06/06/17 0548 06/06/17 0555  BP: 126/76  108/87   Pulse: (!) 104  99   Resp: 16 12 17    Temp: 98.6 F (37 C)  99.6 F (37.6 C)   TempSrc: Oral  Oral   SpO2: 99%  100%   Weight:    85 kg (187 lb 4.8 oz)  Height:        Inpatient medications: . calcitRIOL  0.75 mcg Oral Q M,W,F-HD  . cinacalcet  120 mg Oral Q M,W,F-HD  . darbepoetin (ARANESP) injection - DIALYSIS  150 mcg Intravenous Q Fri-HD  . gabapentin  100 mg Oral BID  . nicotine  21 mg Transdermal Daily  . sevelamer carbonate  3,200 mg Oral TID WC  . sodium chloride flush  3 mL Intravenous Q12H   . sodium chloride Stopped (06/03/17 1209)  . sodium chloride    . sodium chloride    . sodium chloride 10 mL/hr at 06/01/17 1715  . sodium chloride 10 mL/hr at 06/03/17 0924   sodium chloride, sodium chloride, sodium chloride, acetaminophen **OR** acetaminophen, albuterol, alteplase, heparin, hydrocortisone cream, HYDROmorphone, hydrOXYzine, lidocaine (PF), lidocaine-prilocaine, minoxidil, MUSCLE RUB, ondansetron, oxyCODONE-acetaminophen, sodium chloride flush, zolpidem  Exam: Alert, no distress No jvd Chest clear bilat RRR no mrg Abd soft ntnd +bs Ext trace edema R IJ TDC/ dressing LUA NF, Ox 3  Dialysis: [facility] 4h 72.5kg 2/2 Hep 3000 LUA AVF -mircera 200 ug next 11/29 -venofer load 7 doses left 100 mg -sensipar 120 mg tiw w/ HD -left over edw last 4 HD (76 - 81kg post) -s/o early about 50% of session -Hb 9.5, tsat 20%, Ca 10.7, P 4, pth 1475 -home BP Rx : minoxidil 10 mg bid prn when bp reads "above normal"        Impression: 1. LUA AVF edema/blistering: S/P ligation of AVF/Evacuation of hematoma  2. Acute SOB/ resp distress - due to pulm edema, resolved 3. HTN/ vol - BP's controlled, off medication (home minoxidil). Weights appear inaccurate but also he is grossly vol overloaded on exam with  edema in the flanks/ legs 4. ESRD HD MWF 5. Hepatitis B + - HD in isolation  6. Hx hep C- s/p treatment 7. Hx substance abuse- claims abstinence 8. Anemia- hgb 8.8>>7.5>>6.7 after surg- on max mircera due 11/29- gave darbe here. SP 11units prbc's and 2 pks of platelets.  Hb 8- 9 now.  9. MBD of CKD - rocatrol, renvela and sensipar- all cont from home- no recent labs 10. Thrombocytopenia- rebounded but now down again - HIT pending- no hep on HD 11. Dispo- working on changing centers may delay DC as noted above. CLIP process initiated.    Plan - HD today and again tomorrow, get volume down.    [name] MD [facility] pager [phone]   06/06/2017, 11:42 AM   Recent Labs  Lab 06/04/17 0332 06/04/17 2306 06/06/17 0552  NA 132* 130* 127*  K 4.1 4.4 6.0*  CL 94* 92* 90*  CO2 28 28 27   GLUCOSE 164* 118* 100*  BUN 23* 31* 43*  CREATININE 4.51* 5.77* 7.69*  CALCIUM 8.4* 9.1 9.1   Recent Labs  Lab 06/01/17 0329 06/04/17 1518  AST 36 38  ALT 29 21  ALKPHOS 82 96  BILITOT 1.1 1.0  PROT 6.4* 5.2*  ALBUMIN 3.5 2.9*   Recent Labs  Lab 06/03/17 1911 06/04/17 5176  06/04/17 2306 06/05/17 1423 06/06/17 0552  WBC 14.3* 11.4* 11.2* 10.9* 11.1*  NEUTROABS 12.7* 8.8* 7.7  --   --   HGB 6.4* 6.7* 6.7* 8.6* 8.2*  HCT 18.4* 18.8* 19.3* 25.1* 23.9*  MCV 84.8 85.5 86.5 88.4 88.2  PLT 125* 62* 71* 74* 79*   Iron/TIBC/Ferritin/ %Sat    Component Value Date/Time   IRON 116 06/25/2011 0837   TIBC 277 06/25/2011 0837   FERRITIN 550 (H) 06/25/2011 0837   IRONPCTSAT 42 06/25/2011 4949

## 2017-06-06 NOTE — Progress Notes (Signed)
  Progress Note    06/06/2017 10:50 AM 1 Day Post-Op  Subjective:  Says his nerves are bad  Tm 99.6 HR 40'G-867'Y NSR 195'K systolic 932% RA  Vitals:   06/05/17 2300 06/06/17 0548  BP:  108/87  Pulse:  99  Resp: 12 17  Temp:  99.6 F (37.6 C)  SpO2:  100%    Physical Exam: Lungs:  Non labored Incisions:  Wound has no further bleeding; muscle viable    CBC    Component Value Date/Time   WBC 11.1 (H) 06/06/2017 0552   RBC 2.71 (L) 06/06/2017 0552   HGB 8.2 (L) 06/06/2017 0552   HCT 23.9 (L) 06/06/2017 0552   PLT 79 (L) 06/06/2017 0552   MCV 88.2 06/06/2017 0552   MCH 30.3 06/06/2017 0552   MCHC 34.3 06/06/2017 0552   RDW 17.3 (H) 06/06/2017 0552   LYMPHSABS 1.6 06/04/2017 2306   MONOABS 1.7 (H) 06/04/2017 2306   EOSABS 0.1 06/04/2017 2306   BASOSABS 0.0 06/04/2017 2306    BMET    Component Value Date/Time   NA 127 (L) 06/06/2017 0552   K 6.0 (H) 06/06/2017 0552   CL 90 (L) 06/06/2017 0552   CO2 27 06/06/2017 0552   GLUCOSE 100 (H) 06/06/2017 0552   BUN 43 (H) 06/06/2017 0552   CREATININE 7.69 (H) 06/06/2017 0552   CREATININE 9.18 (H) 02/15/2017 1425   CALCIUM 9.1 06/06/2017 0552   CALCIUM 8.1 (L) 07/11/2011 1117   GFRNONAA 7 (L) 06/06/2017 0552   GFRNONAA 6 (L) 02/15/2017 1425   GFRAA 8 (L) 06/06/2017 0552   GFRAA 7 (L) 02/15/2017 1425    INR    Component Value Date/Time   INR 1.20 06/04/2017 2306     Assessment:  56 y.o. male is s/p:  Exploration of left upper arm wound for bleeding Ligation of left brachiocephalic fistula at proximal end  1 Day Post-Op  Plan: -dressing removed-no further bleeding when dressing taken down. -muscle is viable -wet to dry dressing placed. -will start wet to dry dressings with hydrogel starting tomorrow. -thrombocytopenia and hgb stable today   Leontine Locket, PA-C [facility] [phone] 06/06/2017 10:50 AM

## 2017-06-06 NOTE — Progress Notes (Addendum)
[facility] [place]  Subjective  - Doing OK.   Objective 108/87 99 99.6 F (37.6 C) (Oral) 17 100%  Intake/Output Summary (Last 24 hours) at 06/06/2017 0733 Last data filed at 06/05/2017 0944 Gross per 24 hour  Intake 1430 ml  Output 150 ml  Net 1280 ml    Edema in the left UE, ace in place Palpable radial pulse and active range of motion intact, limited due to edema.  Assessment/Planning: POD # 1 Exploration of left upper arm wound for bleeding Ligation of left brachiocephalic fistula at proximal end  Encouraged active range of motion of left UE  Plan to change dressing tomorrow  Roxy Horseman 06/06/2017 7:33 AM --  Laboratory Lab Results: Recent Labs    06/05/17 1423 06/06/17 0552  WBC 10.9* 11.1*  HGB 8.6* 8.2*  HCT 25.1* 23.9*  PLT 74* 79*   BMET Recent Labs    06/04/17 2306 06/06/17 0552  NA 130* 127*  K 4.4 6.0*  CL 92* 90*  CO2 28 27  GLUCOSE 118* 100*  BUN 31* 43*  CREATININE 5.77* 7.69*  CALCIUM 9.1 9.1    COAG Lab Results  Component Value Date   INR 1.20 06/04/2017   INR 1.17 06/03/2017   INR 1.14 06/02/2017   No results found for: PTT  I have interviewed the patient and examined the patient. I agree with the findings by the PA. Continue dressing changes. Use hydrogel so it doesn't bleed.   Gae Gallop, MD [phone]

## 2017-06-06 NOTE — Progress Notes (Signed)
Patient's A-line noted to be dislodged by patient.  Removed and dressed without difficulty.  Will continue to monitor.

## 2017-06-06 NOTE — Progress Notes (Signed)
Patient wants his bed at high position, also refuses to put on the skid socks when he ambulates. The fistula dressing had blood drainage, on call MD paged, Early MD ordered that I reienforce with Kerlix and ace wrap.

## 2017-06-06 NOTE — Progress Notes (Addendum)
PROGRESS NOTE    [name]  QJJ:[id number] DOB: 1960-11-15 DOA: 05/29/2017 PCP: [name], MD     Brief Narrative:  [name] is a 56 yo male with past medical history of hypertension, cocaine abuse, hepatitis B/C, COPD, chronic systolic heart failure, ESRD on dialysis who presented with sudden swelling in LUE. He had a left brachiocephalic AVF shuntogram on 05/26/2017 which noted a patent fistula with >16mm small pseudoaneurysm proximally and moderate bilobe pseudoaneurysm in the mid segment and moderate sized broad pseudoaneurysm distally. He had a tunneled dialysis catheter placed by vascular surgery on 11/19.  He was taken to the OR on 11/21 for evacuation of hematoma of the left arm, on 11/23 for exploration of left upper arm, ligation of fistula, placement of VAC, on 11/25 exploration left arm wound for bleeding with ligation of left brachiocephalic fistula at proximal end.   Assessment & Plan:   Principal Problem:   Dyspnea Active Problems:   Chronic hepatitis C without hepatic coma (HCC)   Essential hypertension   COPD (chronic obstructive pulmonary disease) (HCC)   Hyperkalemia   Tachycardia  Acute pulmonary edema -Now room air. Resolved  LUE AVF infiltration, hematoma  -S/p evacuation left arm hematoma with ligation left arm AV fistula 11/21. Transfused 4u pRBC   -S/p evacuation left arm hematoma with ligation and placement of VAC 11/23. Transfused 3u pRBC, 1 pack platelets -VAC removed overnight 11/23 due to continued bleed  -Transfused 2u pRBC 11/24, 1 pack platelets  -S/p exploration left arm wound for bleeding with ligation of left brachiocephalic fistula at proximal end 11/25. Transfused 2u pRBC -Stable currently. Vascular surgery following  Acute blood loss anemia on chronic anemia of CKD -Baseline Hgb ~9-10 -Continue to trend CBC. Transfusions as above. Stable currently.   Acute on chronic thrombocytopenia -Plt 100-->61-->56. HIT-Ab  negative. Transfusions as above  -In reviewing patient's previous lab work, he has had intermittently fluctuating platelet of 50s-80s over the past year. GI notes possibly related to chronic hepatitis   Mildly elevated troponin  -Peak 0.35 likely secondary to demand ischemia -Had left heart cath with stenting high-grade prox RCA stenosis 02/22/17 and plavix for 6 months  Hx A Fib/flutter with aberrancy -S/p DCCV 03/24/17. Coumadin discontinued as patient noncompliant  ESRD on HD  -Nephrology following -In process of changing HD centers at patient request. CLIP in process   Hepatitis B and C Substance abuse  -Noted   DVT prophylaxis: SCD Code Status: Full Family Communication: No family at bedside Disposition Plan: CLIP in process, working on changing center per patient request which holds up DC    Consultants:   Nephrology  Vascular surgery  Antimicrobials:  Anti-infectives (From admission, onward)   Start     Dose/Rate Route Frequency Ordered Stop   06/03/17 0800  ceFAZolin (ANCEF) IVPB 2g/100 mL premix    Comments:  Send with pt to OR   2 g 200 mL/hr over 30 Minutes Intravenous To ShortStay Surgical 06/03/17 0752 06/03/17 1033   05/30/17 1530  cefUROXime (ZINACEF) 1.5 g in dextrose 5 % 50 mL IVPB     1.5 g 100 mL/hr over 30 Minutes Intravenous To ShortStay Surgical 05/30/17 1445 05/30/17 1721   05/30/17 1015  ceFAZolin (ANCEF) IVPB 2g/100 mL premix  Status:  Discontinued     2 g 200 mL/hr over 30 Minutes Intravenous To Radiology 05/30/17 1014 05/30/17 1458       Subjective: He is asking me for cereal.  No new complaints.  Complains of soreness of his left arm.  No chest pain or shortness of breath.  Objective: Vitals:   06/05/17 1945 06/05/17 2300 06/06/17 0548 06/06/17 0555  BP: 126/76  108/87   Pulse: (!) 104  99   Resp: 16 12 17    Temp: 98.6 F (37 C)  99.6 F (37.6 C)   TempSrc: Oral  Oral   SpO2: 99%  100%   Weight:    85 kg (187 lb 4.8 oz)  Height:         Intake/Output Summary (Last 24 hours) at 06/06/2017 1356 Last data filed at 06/06/2017 0900 Gross per 24 hour  Intake 240 ml  Output -  Net 240 ml   Filed Weights   06/04/17 0132 06/05/17 0452 06/06/17 0555  Weight: 78.1 kg (172 lb 2.9 oz) 81.9 kg (180 lb 8 oz) 85 kg (187 lb 4.8 oz)    Examination:  General exam: Appears calm and comfortable  Respiratory system: Clear to auscultation. Respiratory effort normal. On room air  Cardiovascular system: S1 & S2 heard, RRR. No JVD, murmurs, rubs, gallops or clicks. Trace pedal edema. Gastrointestinal system: Abdomen is nondistended, soft and nontender. No organomegaly or masses felt. Normal bowel sounds heard. Central nervous system: Alert and oriented. No focal neurological deficits. Extremities: Symmetric, +LUE with pressure dressing in place, dry  Psychiatry: Judgement and insight appear stable   Data Reviewed: I have personally reviewed following labs and imaging studies  CBC: Recent Labs  Lab 06/03/17 1911 06/04/17 0332 06/04/17 2306 06/05/17 1423 06/06/17 0552  WBC 14.3* 11.4* 11.2* 10.9* 11.1*  NEUTROABS 12.7* 8.8* 7.7  --   --   HGB 6.4* 6.7* 6.7* 8.6* 8.2*  HCT 18.4* 18.8* 19.3* 25.1* 23.9*  MCV 84.8 85.5 86.5 88.4 88.2  PLT 125* 62* 71* 74* 79*   Basic Metabolic Panel: Recent Labs  Lab 06/03/17 0243 06/03/17 0921 06/03/17 1502 06/04/17 0332 06/04/17 2306 06/06/17 0552  NA 128* 126* 129* 132* 130* 127*  K 5.6* 6.0* 6.8* 4.1 4.4 6.0*  CL 91*  --  91* 94* 92* 90*  CO2 27  --  27 28 28 27   GLUCOSE 106* 83 184* 164* 118* 100*  BUN 46*  --  50* 23* 31* 43*  CREATININE 7.49*  --  8.02* 4.51* 5.77* 7.69*  CALCIUM 8.7*  --  9.3 8.4* 9.1 9.1   GFR: Estimated Creatinine Clearance: 11.9 mL/min (A) (by C-G formula based on SCr of 7.69 mg/dL (H)). Liver Function Tests: Recent Labs  Lab 06/01/17 0329 06/04/17 1518  AST 36 38  ALT 29 21  ALKPHOS 82 96  BILITOT 1.1 1.0  PROT 6.4* 5.2*  ALBUMIN 3.5 2.9*    No results for input(s): LIPASE, AMYLASE in the last 168 hours. No results for input(s): AMMONIA in the last 168 hours. Coagulation Profile: Recent Labs  Lab 06/02/17 0906 06/03/17 1911 06/04/17 2306  INR 1.14 1.17 1.20   Cardiac Enzymes: Recent Labs  Lab 05/31/17 1211 05/31/17 1748 06/01/17 0329  TROPONINI 0.35* 0.27* 0.23*   BNP (last 3 results) No results for input(s): PROBNP in the last 8760 hours. HbA1C: No results for input(s): HGBA1C in the last 72 hours. CBG: No results for input(s): GLUCAP in the last 168 hours. Lipid Profile: No results for input(s): CHOL, HDL, LDLCALC, TRIG, CHOLHDL, LDLDIRECT in the last 72 hours. Thyroid Function Tests: No results for input(s): TSH, T4TOTAL, FREET4, T3FREE, THYROIDAB in the last 72 hours. Anemia Panel: No results for input(s): VITAMINB12,  FOLATE, FERRITIN, TIBC, IRON, RETICCTPCT in the last 72 hours. Sepsis Labs: No results for input(s): PROCALCITON, LATICACIDVEN in the last 168 hours.  Recent Results (from the past 240 hour(s))  Surgical PCR screen     Status: None   Collection Time: 06/03/17  8:39 AM  Result Value Ref Range Status   MRSA, PCR NEGATIVE NEGATIVE Final   Staphylococcus aureus NEGATIVE NEGATIVE Final    Comment: (NOTE) The Xpert SA Assay (FDA approved for NASAL specimens in patients 25 years of age and older), is one component of a comprehensive surveillance program. It is not intended to diagnose infection nor to guide or monitor treatment.        Radiology Studies: No results found.    Scheduled Meds: . calcitRIOL  0.75 mcg Oral Q M,W,F-HD  . cinacalcet  120 mg Oral Q M,W,F-HD  . darbepoetin (ARANESP) injection - DIALYSIS  150 mcg Intravenous Q Fri-HD  . gabapentin  100 mg Oral BID  . nicotine  21 mg Transdermal Daily  . sevelamer carbonate  3,200 mg Oral TID WC  . sodium chloride flush  3 mL Intravenous Q12H   Continuous Infusions: . sodium chloride Stopped (06/03/17 1209)  . sodium  chloride    . sodium chloride    . sodium chloride 10 mL/hr at 06/01/17 1715  . sodium chloride 10 mL/hr at 06/03/17 0924     LOS: 8 days    Time spent: 30 minutes   Dessa Phi, DO [facility] [email or web address] Password [email or web address] 06/06/2017, 1:56 PM

## 2017-06-06 NOTE — Care Management Important Message (Signed)
Important Message  Patient Details  Name: [name] MRN: [id number] Date of Birth: 01/10/1961   Medicare Important Message Given:  Yes    [name] 06/06/2017, 10:06 AM

## 2017-06-07 LAB — CBC
HCT: 25.6 % — ABNORMAL LOW (ref 39.0–52.0)
Hemoglobin: 8.7 g/dL — ABNORMAL LOW (ref 13.0–17.0)
MCH: 30.5 pg (ref 26.0–34.0)
MCHC: 34 g/dL (ref 30.0–36.0)
MCV: 89.8 fL (ref 78.0–100.0)
Platelets: 69 10*3/uL — ABNORMAL LOW (ref 150–400)
RBC: 2.85 MIL/uL — ABNORMAL LOW (ref 4.22–5.81)
RDW: 17 % — ABNORMAL HIGH (ref 11.5–15.5)
WBC: 11.6 10*3/uL — ABNORMAL HIGH (ref 4.0–10.5)

## 2017-06-07 LAB — BASIC METABOLIC PANEL
Anion gap: 12 (ref 5–15)
BUN: 31 mg/dL — ABNORMAL HIGH (ref 6–20)
CO2: 24 mmol/L (ref 22–32)
Calcium: 9.3 mg/dL (ref 8.9–10.3)
Chloride: 92 mmol/L — ABNORMAL LOW (ref 101–111)
Creatinine, Ser: 5.79 mg/dL — ABNORMAL HIGH (ref 0.61–1.24)
GFR calc Af Amer: 12 mL/min — ABNORMAL LOW (ref >60)
GFR calc non Af Amer: 10 mL/min — ABNORMAL LOW (ref >60)
Glucose, Bld: 97 mg/dL (ref 65–99)
Potassium: 5.3 mmol/L — ABNORMAL HIGH (ref 3.5–5.1)
Sodium: 128 mmol/L — ABNORMAL LOW (ref 135–145)

## 2017-06-07 MED ORDER — CLOPIDOGREL BISULFATE 75 MG PO TABS: 75.0000 mg | ORAL_TABLET | Freq: Every day | ORAL | Status: DC

## 2017-06-07 MED ORDER — SENNA 8.6 MG PO TABS
1.0000 | ORAL_TABLET | Freq: Every day | ORAL | Status: DC | PRN
  Administered 2017-06-07 – 2017-06-08 (×2): 8.6 mg via ORAL
  Filled 2017-06-07 (×2): qty 1

## 2017-06-07 NOTE — Progress Notes (Signed)
[facility]  Subjective  - feels ok   Objective 128/84 (!) 109 100.1 F (37.8 C) (Oral) 15 96%  Intake/Output Summary (Last 24 hours) at 06/07/2017 0935 Last data filed at 06/06/2017 1920 Gross per 24 hour  Intake 240 ml  Output 4150 ml  Net -3910 ml   Left arm without active bleeding Still some edema hand to shoulder Open wound 7 x 7 cm with viable muscle  Assessment/Planning: Will most likely need to consider skin graft at some point down the road, few weeks Will transition to once daily hydrogel dressings starting today  Ruta Hinds 06/07/2017 9:35 AM --  Laboratory Lab Results: Recent Labs    06/06/17 0552 06/07/17 0214  WBC 11.1* 11.6*  HGB 8.2* 8.7*  HCT 23.9* 25.6*  PLT 79* 69*   BMET Recent Labs    06/06/17 0552 06/07/17 0214  NA 127* 128*  K 6.0* 5.3*  CL 90* 92*  CO2 27 24  GLUCOSE 100* 97  BUN 43* 31*  CREATININE 7.69* 5.79*  CALCIUM 9.1 9.3    COAG Lab Results  Component Value Date   INR 1.20 06/04/2017   INR 1.17 06/03/2017   INR 1.14 06/02/2017   No results found for: PTT

## 2017-06-07 NOTE — Progress Notes (Signed)
Early MD paged about patient's reienforced arm that is swollen  and back too with some discoloration. Per MD, there is no active bleeding there is no need for further order except to monitor until morning. Will continue to monitor.

## 2017-06-07 NOTE — Progress Notes (Signed)
Responded to page from nurse to return and talk to patient.  Nurse indicated that patient had SI. Explored with patient what wasmeaninful to him and allowed him to share.  Patient talked about his past and family history as well as the family dynamic that has caused him much pain down through the years.  Patient said he had been carrying this burden for seven years, but not clear on what that burden was.. Patient had not been completely truthful in his sharing regarding family support.  During my visit his cousin was at bedside and shortly thereafter his son arrived. Nurse and sitter were presence during my visit.  Patient said he was thinking about Killing himself.  I explored with him how he planned to do this. He said with a match and gas.  Patient was a little aggressive with me staff but not threaten.  Provided empathetic listening, emotional, psychological and spiritual support. Supported Biochemist, clinical and facilitated information sharing between staff, patient and family.  Chaplain available as needed.    06/07/17 1652  Clinical Encounter Type  Visited With Patient;Family;Patient and family together;Health care provider  Visit Type Follow-up;Psychological support;Spiritual support  Referral From Patient;Nurse  Spiritual Encounters  Spiritual Needs Prayer;Emotional  Stress Factors  Patient Stress Factors Exhausted;Loss of control  Cristopher Peru, [facility], Pager [phone]

## 2017-06-07 NOTE — Progress Notes (Addendum)
Called by RN and Agricultural consultant for "second set of eyes" on patient for bruising on Left chest lateral, down the left side, down into the left flank, and into the left side of his back.   Earlier RN noticed bleeding from L ARM vascular site, RN called [facility] MD call, received order for reinforcing the dressing.   On exam, patient was alert, slightly disoriented (per RNs received bedtime ambien and woke up after having a nightmare, was altered), follows commands, skin warm to touch, LUA warm to touch, + 2 pulse in the LUA.  I did notice the brusing down his entire left chest and left side of this upper body, bruising down the posterior side of left shoulder, however patient does not endorse pain and there is no hematoma present. Heart/lung sounds normal, abdomen slightly distended.   RN had already contacted [facility] MD on call and made MD aware, VSS. Patient is on the [facility] service (primary), I asked the RN to page primary service on call and inform them of the finding and to mark the bruising as well.   Patient has required multiple transfusions of blood products and has had low platelets, RNs obtained morning labs now, which I agreed with.   [facility] APP called back at 240, we spoke, updated him on status of patient  Plan: will see what labs result, if needed based off labs, will get imaging > CT ?, VSS.   Start Time 205 End Time 245

## 2017-06-07 NOTE — Progress Notes (Addendum)
PROGRESS NOTE    [name]  BZJ:[id number] DOB: Aug 24, 1960 DOA: 05/29/2017 PCP: [name], MD     Brief Narrative:  [name] is a 56 yo male with past medical history of hypertension, cocaine abuse, hepatitis B/C, COPD, chronic systolic heart failure, ESRD on dialysis who presented with sudden swelling in LUE. He had a left brachiocephalic AVF shuntogram on 05/26/2017 which noted a patent fistula with >13mm small pseudoaneurysm proximally and moderate bilobe pseudoaneurysm in the mid segment and moderate sized broad pseudoaneurysm distally. He had a tunneled dialysis catheter placed by vascular surgery on 11/19.  He was taken to the OR on 11/21 for evacuation of hematoma of the left arm, on 11/23 for exploration of left upper arm, ligation of fistula, placement of VAC, on 11/25 exploration left arm wound for bleeding with ligation of left brachiocephalic fistula at proximal end.   Assessment & Plan:   Principal Problem:   Dyspnea Active Problems:   Chronic hepatitis C without hepatic coma (HCC)   Essential hypertension   COPD (chronic obstructive pulmonary disease) (HCC)   Hyperkalemia   Tachycardia  Acute pulmonary edema -Now room air. Resolved  LUE AVF infiltration, hematoma  -S/p evacuation left arm hematoma with ligation left arm AV fistula 11/21. Transfused 4u pRBC   -S/p evacuation left arm hematoma with ligation and placement of VAC 11/23. Transfused 3u pRBC, 1 pack platelets -VAC removed overnight 11/23 due to continued bleed  -Transfused 2u pRBC 11/24, 1 pack platelets  -S/p exploration left arm wound for bleeding with ligation of left brachiocephalic fistula at proximal end 11/25. Transfused 2u pRBC -Vascular surgery following  Acute blood loss anemia on chronic anemia of CKD -Baseline Hgb ~9-10 -Continue to trend CBC. Transfusions as above. Hgb stable currently  Acute on chronic thrombocytopenia -Plt 100-->61-->56. HIT-Ab negative. Transfusions  as above  -In reviewing patient's previous lab work, he has had intermittently fluctuating platelet of 50s-80s over the past year. GI notes possibly related to chronic hepatitis  -Platelets stable currently, monitor.   Mildly elevated troponin  -Peak 0.35 likely secondary to demand ischemia -Had left heart cath with stenting high-grade prox RCA stenosis 02/22/17 and plavix/coumadin for 6 months. Coumadin stopped due to poor outpatient compliance. ?Allergy to aspirin although it looks like he tolerated during hospitalization in October. Plavix on hold due to significant LUE bleed with 11u pRBC transfusions in the past week with operation three times. Discussed with Dr. Oneida Alar today, possibly resume tomorrow if looks stable    Hx A Fib/flutter with aberrancy -S/p DCCV 03/24/17. Coumadin discontinued as patient noncompliant  ESRD on HD  -Nephrology following -In process of changing HD centers at patient request. CLIP in process   Hepatitis B and C Substance abuse  -Noted   DVT prophylaxis: SCD Code Status: Full Family Communication: No family at bedside Disposition Plan: CLIP in process, working on changing center per patient request which holds up DC    Consultants:   Nephrology  Vascular surgery  Antimicrobials:  Anti-infectives (From admission, onward)   Start     Dose/Rate Route Frequency Ordered Stop   06/03/17 0800  ceFAZolin (ANCEF) IVPB 2g/100 mL premix    Comments:  Send with pt to OR   2 g 200 mL/hr over 30 Minutes Intravenous To [facility] 06/03/17 0752 06/03/17 1033   05/30/17 1530  cefUROXime (ZINACEF) 1.5 g in dextrose 5 % 50 mL IVPB     1.5 g 100 mL/hr over 30 Minutes Intravenous To [employer]  Surgical 05/30/17 1445 05/30/17 1721   05/30/17 1015  ceFAZolin (ANCEF) IVPB 2g/100 mL premix  Status:  Discontinued     2 g 200 mL/hr over 30 Minutes Intravenous To Radiology 05/30/17 1014 05/30/17 1458       Subjective: Complains of bruising around his left  upper extremity and down to his flank overnight.  Denies any pain in this area.  No other new complaints.  Doing well all things considered.  No shortness of breath or chest pain.  Objective: Vitals:   06/07/17 0153 06/07/17 0654 06/07/17 0704 06/07/17 1256  BP: (!) 152/88 128/84  117/66  Pulse: (!) 117 (!) 109  99  Resp: 16 15  15   Temp:  100.1 F (37.8 C)  (!) 100.4 F (38 C)  TempSrc:  Oral  Oral  SpO2: 94% 96%  95%  Weight:   82.1 kg (181 lb 1.6 oz)   Height:        Intake/Output Summary (Last 24 hours) at 06/07/2017 1306 Last data filed at 06/06/2017 1920 Gross per 24 hour  Intake -  Output 4150 ml  Net -4150 ml   Filed Weights   06/06/17 1608 06/06/17 1920 06/07/17 0704  Weight: 85.2 kg (187 lb 13.3 oz) 81.2 kg (179 lb 0.2 oz) 82.1 kg (181 lb 1.6 oz)    Examination:  General exam: Appears calm and comfortable  Respiratory system: Clear to auscultation. Respiratory effort normal. On room air  Cardiovascular system: S1 & S2 heard, RRR. No JVD, murmurs, rubs, gallops or clicks. Trace pedal edema. Gastrointestinal system: Abdomen is nondistended, soft and nontender. No organomegaly or masses felt. Normal bowel sounds heard. Central nervous system: Alert and oriented. No focal neurological deficits. Extremities: Symmetric, +LUE with pressure dressing in place, dry  Psychiatry: Judgement and insight appear stable   Data Reviewed: I have personally reviewed following labs and imaging studies  CBC: Recent Labs  Lab 06/03/17 1911 06/04/17 0332 06/04/17 2306 06/05/17 1423 06/06/17 0552 06/07/17 0214  WBC 14.3* 11.4* 11.2* 10.9* 11.1* 11.6*  NEUTROABS 12.7* 8.8* 7.7  --   --   --   HGB 6.4* 6.7* 6.7* 8.6* 8.2* 8.7*  HCT 18.4* 18.8* 19.3* 25.1* 23.9* 25.6*  MCV 84.8 85.5 86.5 88.4 88.2 89.8  PLT 125* 62* 71* 74* 79* 69*   Basic Metabolic Panel: Recent Labs  Lab 06/03/17 1502 06/04/17 0332 06/04/17 2306 06/06/17 0552 06/07/17 0214  NA 129* 132* 130* 127* 128*    K 6.8* 4.1 4.4 6.0* 5.3*  CL 91* 94* 92* 90* 92*  CO2 27 28 28 27 24   GLUCOSE 184* 164* 118* 100* 97  BUN 50* 23* 31* 43* 31*  CREATININE 8.02* 4.51* 5.77* 7.69* 5.79*  CALCIUM 9.3 8.4* 9.1 9.1 9.3   GFR: Estimated Creatinine Clearance: 14.6 mL/min (A) (by C-G formula based on SCr of 5.79 mg/dL (H)). Liver Function Tests: Recent Labs  Lab 06/01/17 0329 06/04/17 1518  AST 36 38  ALT 29 21  ALKPHOS 82 96  BILITOT 1.1 1.0  PROT 6.4* 5.2*  ALBUMIN 3.5 2.9*   No results for input(s): LIPASE, AMYLASE in the last 168 hours. No results for input(s): AMMONIA in the last 168 hours. Coagulation Profile: Recent Labs  Lab 06/02/17 0906 06/03/17 1911 06/04/17 2306  INR 1.14 1.17 1.20   Cardiac Enzymes: Recent Labs  Lab 05/31/17 1748 06/01/17 0329  TROPONINI 0.27* 0.23*   BNP (last 3 results) No results for input(s): PROBNP in the last 8760 hours. HbA1C: No  results for input(s): HGBA1C in the last 72 hours. CBG: No results for input(s): GLUCAP in the last 168 hours. Lipid Profile: No results for input(s): CHOL, HDL, LDLCALC, TRIG, CHOLHDL, LDLDIRECT in the last 72 hours. Thyroid Function Tests: No results for input(s): TSH, T4TOTAL, FREET4, T3FREE, THYROIDAB in the last 72 hours. Anemia Panel: No results for input(s): VITAMINB12, FOLATE, FERRITIN, TIBC, IRON, RETICCTPCT in the last 72 hours. Sepsis Labs: No results for input(s): PROCALCITON, LATICACIDVEN in the last 168 hours.  Recent Results (from the past 240 hour(s))  Surgical PCR screen     Status: None   Collection Time: 06/03/17  8:39 AM  Result Value Ref Range Status   MRSA, PCR NEGATIVE NEGATIVE Final   Staphylococcus aureus NEGATIVE NEGATIVE Final    Comment: (NOTE) The Xpert SA Assay (FDA approved for NASAL specimens in patients 40 years of age and older), is one component of a comprehensive surveillance program. It is not intended to diagnose infection nor to guide or monitor treatment.         Radiology Studies: No results found.    Scheduled Meds: . calcitRIOL  0.75 mcg Oral Q M,W,F-HD  . cinacalcet  120 mg Oral Q M,W,F-HD  . darbepoetin (ARANESP) injection - DIALYSIS  150 mcg Intravenous Q Fri-HD  . gabapentin  100 mg Oral BID  . nicotine  21 mg Transdermal Daily  . sevelamer carbonate  3,200 mg Oral TID WC  . sodium chloride flush  3 mL Intravenous Q12H   Continuous Infusions: . sodium chloride    . sodium chloride 10 mL/hr at 06/01/17 1715  . sodium chloride 10 mL/hr at 06/03/17 0924     LOS: 9 days    Time spent: 30 minutes   Dessa Phi, DO [facility] [email or web address] Password [email or web address] 06/07/2017, 1:06 PM

## 2017-06-07 NOTE — Progress Notes (Signed)
[facility] Progress Note  Subjective: no new c/o  Vitals:   06/07/17 0153 06/07/17 0654 06/07/17 0704 06/07/17 1256  BP: (!) 152/88 128/84  117/66  Pulse: (!) 117 (!) 109  99  Resp: 16 15  15   Temp:  100.1 F (37.8 C)  (!) 100.4 F (38 C)  TempSrc:  Oral  Oral  SpO2: 94% 96%  95%  Weight:   82.1 kg (181 lb 1.6 oz)   Height:        Inpatient medications: . calcitRIOL  0.75 mcg Oral Q M,W,F-HD  . cinacalcet  120 mg Oral Q M,W,F-HD  . darbepoetin (ARANESP) injection - DIALYSIS  150 mcg Intravenous Q Fri-HD  . gabapentin  100 mg Oral BID  . nicotine  21 mg Transdermal Daily  . sevelamer carbonate  3,200 mg Oral TID WC  . sodium chloride flush  3 mL Intravenous Q12H   . sodium chloride    . sodium chloride 10 mL/hr at 06/01/17 1715  . sodium chloride 10 mL/hr at 06/03/17 0924   sodium chloride, acetaminophen **OR** acetaminophen, albuterol, hydrocortisone cream, HYDROmorphone, hydrOXYzine, minoxidil, MUSCLE RUB, ondansetron, oxyCODONE-acetaminophen, promethazine, simethicone, sodium chloride flush, zolpidem  Exam: Alert, no distress No jvd Chest clear bilat RRR no mrg Abd soft ntnd +bs Ext 2+ bilat pitting LE edema bilat flank edema, dependent R IJ TDC/ dressing LUA NF, Ox 3  Dialysis: [facility] 4h 72.5kg 2/2 Hep 3000 LUA AVF -mircera 200 ug next 11/29 -venofer load 7 doses left 100 mg -sensipar 120 mg tiw w/ HD -left over edw last 4 HD (76 - 81kg post) -s/o early about 50% of session -Hb 9.5, tsat 20%, Ca 10.7, P 4, pth 1475 -home BP Rx : minoxidil 10 mg bid prn when bp reads "above normal"        Impression: 1. LUA AVF edema/blistering: S/P ligation of AVF/Evacuation of hematoma  2. Acute SOB/ resp distress - resolved 3. Vol overload -still up 10kg , anasarca 4. HTN/ vol - BP's controlled, off medication (home minoxidil prn) 5. ESRD HD MWF. Unable to do extra HD today due to pt volume 6. Hepatitis B + - HD in isolation  7. Hx hep C-  s/p treatment 8. Hx substance abuse- claims abstinence 9. Anemia- hgb 8.8>>7.5>>6.7 after surg- on max mircera due 11/29- gave darbe here. SP 11units prbc's and 2 pks of platelets.  Hb 8- 9 now.  10. MBD of CKD - rocatrol, renvela and sensipar- all cont from home- no recent labs 8. Thrombocytopenia- rebounded but now down again - HIT pending- no hep on HD 12. Dispo- working on changing centers may delay DC as noted above. CLIP process initiated.    Plan - HD Wed, max UF, limit fluid intake   [name] MD [facility] pager [phone]   06/07/2017, 4:37 PM   Recent Labs  Lab 06/04/17 2306 06/06/17 0552 06/07/17 0214  NA 130* 127* 128*  K 4.4 6.0* 5.3*  CL 92* 90* 92*  CO2 28 27 24   GLUCOSE 118* 100* 97  BUN 31* 43* 31*  CREATININE 5.77* 7.69* 5.79*  CALCIUM 9.1 9.1 9.3   Recent Labs  Lab 06/01/17 0329 06/04/17 1518  AST 36 38  ALT 29 21  ALKPHOS 82 96  BILITOT 1.1 1.0  PROT 6.4* 5.2*  ALBUMIN 3.5 2.9*   Recent Labs  Lab 06/03/17 1911 06/04/17 0332 06/04/17 2306 06/05/17 1423 06/06/17 0552 06/07/17 0214  WBC 14.3* 11.4* 11.2* 10.9* 11.1*  11.6*  NEUTROABS 12.7* 8.8* 7.7  --   --   --   HGB 6.4* 6.7* 6.7* 8.6* 8.2* 8.7*  HCT 18.4* 18.8* 19.3* 25.1* 23.9* 25.6*  MCV 84.8 85.5 86.5 88.4 88.2 89.8  PLT 125* 62* 71* 74* 79* 69*   Iron/TIBC/Ferritin/ %Sat    Component Value Date/Time   IRON 116 06/25/2011 0837   TIBC 277 06/25/2011 0837   FERRITIN 550 (H) 06/25/2011 0837   IRONPCTSAT 42 06/25/2011 3748

## 2017-06-07 NOTE — Progress Notes (Signed)
Responded to spiritual care consult to continue  Support.   Prayed for patient.  Will follow as needed.  Jaclynn Major, [place], [facility], Pager [phone]

## 2017-06-08 DIAGNOSIS — F1410 Cocaine abuse, uncomplicated: Secondary | ICD-10-CM

## 2017-06-08 DIAGNOSIS — F321 Major depressive disorder, single episode, moderate: Secondary | ICD-10-CM

## 2017-06-08 DIAGNOSIS — R000 Tachycardia, unspecified: Secondary | ICD-10-CM

## 2017-06-08 DIAGNOSIS — T82590A Other mechanical complication of surgically created arteriovenous fistula, initial encounter: Secondary | ICD-10-CM

## 2017-06-08 DIAGNOSIS — Z560 Unemployment, unspecified: Secondary | ICD-10-CM

## 2017-06-08 DIAGNOSIS — F1210 Cannabis abuse, uncomplicated: Secondary | ICD-10-CM

## 2017-06-08 DIAGNOSIS — B182 Chronic viral hepatitis C: Secondary | ICD-10-CM

## 2017-06-08 DIAGNOSIS — R4587 Impulsiveness: Secondary | ICD-10-CM

## 2017-06-08 DIAGNOSIS — F17210 Nicotine dependence, cigarettes, uncomplicated: Secondary | ICD-10-CM

## 2017-06-08 DIAGNOSIS — R45851 Suicidal ideations: Secondary | ICD-10-CM

## 2017-06-08 DIAGNOSIS — J42 Unspecified chronic bronchitis: Secondary | ICD-10-CM

## 2017-06-08 LAB — CBC
HCT: 25.2 % — ABNORMAL LOW (ref 39.0–52.0)
Hemoglobin: 8.4 g/dL — ABNORMAL LOW (ref 13.0–17.0)
MCH: 29.7 pg (ref 26.0–34.0)
MCHC: 33.3 g/dL (ref 30.0–36.0)
MCV: 89 fL (ref 78.0–100.0)
Platelets: 93 10*3/uL — ABNORMAL LOW (ref 150–400)
RBC: 2.83 MIL/uL — ABNORMAL LOW (ref 4.22–5.81)
RDW: 16.5 % — ABNORMAL HIGH (ref 11.5–15.5)
WBC: 12 10*3/uL — ABNORMAL HIGH (ref 4.0–10.5)

## 2017-06-08 LAB — RENAL FUNCTION PANEL
Albumin: 3.1 g/dL — ABNORMAL LOW (ref 3.5–5.0)
Anion gap: 11 (ref 5–15)
BUN: 46 mg/dL — ABNORMAL HIGH (ref 6–20)
CO2: 25 mmol/L (ref 22–32)
Calcium: 9.1 mg/dL (ref 8.9–10.3)
Chloride: 92 mmol/L — ABNORMAL LOW (ref 101–111)
Creatinine, Ser: 7.64 mg/dL — ABNORMAL HIGH (ref 0.61–1.24)
GFR calc Af Amer: 8 mL/min — ABNORMAL LOW (ref >60)
GFR calc non Af Amer: 7 mL/min — ABNORMAL LOW (ref >60)
Glucose, Bld: 111 mg/dL — ABNORMAL HIGH (ref 65–99)
Phosphorus: 5 mg/dL — ABNORMAL HIGH (ref 2.5–4.6)
Potassium: 5.8 mmol/L — ABNORMAL HIGH (ref 3.5–5.1)
Sodium: 128 mmol/L — ABNORMAL LOW (ref 135–145)

## 2017-06-08 MED ORDER — ALTEPLASE 2 MG IJ SOLR: 2.0000 mg | Freq: Once | INTRAMUSCULAR | Status: DC | PRN

## 2017-06-08 MED ORDER — HEPARIN SODIUM (PORCINE) 1000 UNIT/ML DIALYSIS: 1000.0000 [IU] | INTRAMUSCULAR | Status: DC | PRN

## 2017-06-08 MED ORDER — LIDOCAINE-PRILOCAINE 2.5-2.5 % EX CREA: 1.0000 "application " | TOPICAL_CREAM | CUTANEOUS | Status: DC | PRN

## 2017-06-08 MED ORDER — SODIUM CHLORIDE 0.9 % IV SOLN: 100.0000 mL | INTRAVENOUS | Status: DC | PRN

## 2017-06-08 MED ORDER — CLOPIDOGREL BISULFATE 75 MG PO TABS
75.0000 mg | ORAL_TABLET | Freq: Every day | ORAL | Status: DC
  Administered 2017-06-08 – 2017-06-10 (×3): 75 mg via ORAL
  Filled 2017-06-08 (×4): qty 1

## 2017-06-08 MED ORDER — PENTAFLUOROPROP-TETRAFLUOROETH EX AERO: 1.0000 "application " | INHALATION_SPRAY | CUTANEOUS | Status: DC | PRN

## 2017-06-08 MED ORDER — LIDOCAINE HCL (PF) 1 % IJ SOLN: 5.0000 mL | INTRAMUSCULAR | Status: DC | PRN

## 2017-06-08 NOTE — Progress Notes (Signed)
PROGRESS NOTE    [name]  KPT:[id number] DOB: December 13, 1960 DOA: 05/29/2017 PCP: [name], MD   Brief Narrative: [name] is a 56 y.o. male with past medical history of hypertension, cocaine abuse, hepatitis B/C, COPD, chronic systolic heart failure, ESRD on dialysis who presented with sudden swelling in LUE. He had a left brachiocephalic AVF shuntogram on 05/26/2017 which noted a patent fistula with >67mm small pseudoaneurysm proximally and moderate bilobe pseudoaneurysm in the mid segment and moderate sized broad pseudoaneurysm distally. He had a tunneled dialysis catheter placed by vascular surgery on 11/19.  He was taken to the OR on 11/21 for evacuation of hematoma of the left arm, on 11/23 for exploration of left upper arm, ligation of fistula, placement of VAC, on 11/25 exploration left arm wound for bleeding with ligation of left brachiocephalic fistula at proximal end.   Assessment & Plan:   Principal Problem:   Dyspnea Active Problems:   Chronic hepatitis C without hepatic coma (HCC)   Essential hypertension   COPD (chronic obstructive pulmonary disease) (HCC)   Hyperkalemia   Tachycardia   Acute pulmonary edema Secondary to fluid overload. Resolved with dialysis.  LUE AVF hematoma S/p evacuation of hematoma on 11/21, exploration of left upper arm on 11/23 with ligation of his fistula and placement of VAC and exploration of left arm on 11/25 with ligation of brachiocephalic fistula. Required 11 units of PRBC. Stable now. -vascular surgery recommendations  Acute blood loss anemia Secondary to hematoma. Required 11 units of PRBC.  Anemia of chronic disease Secondary to ESRD. -per nephrology  Acute on chronic thrombocytopenia Improved.  Elevated troponin No chest pain.  Atrial fibrillation/flutter S/p DCCV. Not on anticoagulation secondary to non-compliance.  ESRD on HD -Nephrology recommendations  Hepatitis B and C Substance  abuse   DVT prophylaxis: SCDs Code Status: Full code Family Communication: None at bedside Disposition Plan: When medically stable.   Consultants:   Vascular surgery  Nephrology  Procedures:   Evacuation of hematoma (11/21)  Exploration of left upper arm with ligation of his fistula and placement of VAC (11/23)  Exploration of left arm with ligation of brachiocephalic fistula. (11/25)  Antimicrobials:  None    Subjective: Wants to sleep.  Objective: Vitals:   06/08/17 0647 06/08/17 0725 06/08/17 0739 06/08/17 0800  BP:  (!) 141/76 124/75 117/65  Pulse:  95 96 (!) 109  Resp:  12    Temp:  98.8 F (37.1 C)    TempSrc:  Axillary    SpO2:      Weight: 83.3 kg (183 lb 11.2 oz) 83.8 kg (184 lb 11.9 oz)    Height:        Intake/Output Summary (Last 24 hours) at 06/08/2017 0815 Last data filed at 06/07/2017 1836 Gross per 24 hour  Intake 840 ml  Output -  Net 840 ml   Filed Weights   06/07/17 0704 06/08/17 0647 06/08/17 0725  Weight: 82.1 kg (181 lb 1.6 oz) 83.3 kg (183 lb 11.2 oz) 83.8 kg (184 lb 11.9 oz)    Examination:  General exam: Appears calm and comfortable Respiratory system: Clear to auscultation. Respiratory effort normal. Cardiovascular system: S1 & S2 heard, RRR. 2/6 systolic murmur Gastrointestinal system: Abdomen is nondistended, soft and nontender. No organomegaly or masses felt. Normal bowel sounds heard. Central nervous system: Alert and oriented to self. Patient would not answer furhter orientation questions. No focal neurological deficits. Extremities: 2+ bilateral LE edema. No calf tenderness Skin: No cyanosis. No  rashes Psychiatry: Agitated. Would not answer questions about suicidal ideation.    Data Reviewed: I have personally reviewed following labs and imaging studies  CBC: Recent Labs  Lab 06/03/17 1911 06/04/17 0332 06/04/17 2306 06/05/17 1423 06/06/17 0552 06/07/17 0214  WBC 14.3* 11.4* 11.2* 10.9* 11.1* 11.6*   NEUTROABS 12.7* 8.8* 7.7  --   --   --   HGB 6.4* 6.7* 6.7* 8.6* 8.2* 8.7*  HCT 18.4* 18.8* 19.3* 25.1* 23.9* 25.6*  MCV 84.8 85.5 86.5 88.4 88.2 89.8  PLT 125* 62* 71* 74* 79* 69*   Basic Metabolic Panel: Recent Labs  Lab 06/03/17 1502 06/04/17 0332 06/04/17 2306 06/06/17 0552 06/07/17 0214  NA 129* 132* 130* 127* 128*  K 6.8* 4.1 4.4 6.0* 5.3*  CL 91* 94* 92* 90* 92*  CO2 27 28 28 27 24   GLUCOSE 184* 164* 118* 100* 97  BUN 50* 23* 31* 43* 31*  CREATININE 8.02* 4.51* 5.77* 7.69* 5.79*  CALCIUM 9.3 8.4* 9.1 9.1 9.3   GFR: Estimated Creatinine Clearance: 14.6 mL/min (A) (by C-G formula based on SCr of 5.79 mg/dL (H)). Liver Function Tests: Recent Labs  Lab 06/04/17 1518  AST 38  ALT 21  ALKPHOS 96  BILITOT 1.0  PROT 5.2*  ALBUMIN 2.9*   No results for input(s): LIPASE, AMYLASE in the last 168 hours. No results for input(s): AMMONIA in the last 168 hours. Coagulation Profile: Recent Labs  Lab 06/02/17 0906 06/03/17 1911 06/04/17 2306  INR 1.14 1.17 1.20   Cardiac Enzymes: No results for input(s): CKTOTAL, CKMB, CKMBINDEX, TROPONINI in the last 168 hours. BNP (last 3 results) No results for input(s): PROBNP in the last 8760 hours. HbA1C: No results for input(s): HGBA1C in the last 72 hours. CBG: No results for input(s): GLUCAP in the last 168 hours. Lipid Profile: No results for input(s): CHOL, HDL, LDLCALC, TRIG, CHOLHDL, LDLDIRECT in the last 72 hours. Thyroid Function Tests: No results for input(s): TSH, T4TOTAL, FREET4, T3FREE, THYROIDAB in the last 72 hours. Anemia Panel: No results for input(s): VITAMINB12, FOLATE, FERRITIN, TIBC, IRON, RETICCTPCT in the last 72 hours. Sepsis Labs: No results for input(s): PROCALCITON, LATICACIDVEN in the last 168 hours.  Recent Results (from the past 240 hour(s))  Surgical PCR screen     Status: None   Collection Time: 06/03/17  8:39 AM  Result Value Ref Range Status   MRSA, PCR NEGATIVE NEGATIVE Final    Staphylococcus aureus NEGATIVE NEGATIVE Final    Comment: (NOTE) The Xpert SA Assay (FDA approved for NASAL specimens in patients 20 years of age and older), is one component of a comprehensive surveillance program. It is not intended to diagnose infection nor to guide or monitor treatment.          Radiology Studies: No results found.      Scheduled Meds: . calcitRIOL  0.75 mcg Oral Q M,W,F-HD  . cinacalcet  120 mg Oral Q M,W,F-HD  . darbepoetin (ARANESP) injection - DIALYSIS  150 mcg Intravenous Q Fri-HD  . gabapentin  100 mg Oral BID  . nicotine  21 mg Transdermal Daily  . sevelamer carbonate  3,200 mg Oral TID WC  . sodium chloride flush  3 mL Intravenous Q12H   Continuous Infusions: . sodium chloride    . sodium chloride 10 mL/hr at 06/01/17 1715  . sodium chloride 10 mL/hr at 06/03/17 0924  . sodium chloride    . sodium chloride       LOS: 10 days  Cordelia Poche, MD [facility] 06/08/2017, 8:15 AM Pager: [phone]  If 7PM-7AM, please contact night-coverage [email or web address] Password [email or web address] 06/08/2017, 8:15 AM

## 2017-06-08 NOTE — Progress Notes (Signed)
[facility] Progress Note  Subjective: no new c/o  Vitals:   06/08/17 0800 06/08/17 0830 06/08/17 0900 06/08/17 0930  BP: 117/65 (!) 151/59 (!) 160/85 139/74  Pulse: (!) 109 (!) 112 (!) 103 89  Resp:      Temp:      TempSrc:      SpO2:      Weight:      Height:        Inpatient medications: . calcitRIOL  0.75 mcg Oral Q M,W,F-HD  . cinacalcet  120 mg Oral Q M,W,F-HD  . darbepoetin (ARANESP) injection - DIALYSIS  150 mcg Intravenous Q Fri-HD  . gabapentin  100 mg Oral BID  . nicotine  21 mg Transdermal Daily  . sevelamer carbonate  3,200 mg Oral TID WC  . sodium chloride flush  3 mL Intravenous Q12H   . sodium chloride    . sodium chloride 10 mL/hr at 06/01/17 1715  . sodium chloride 10 mL/hr at 06/03/17 0924  . sodium chloride    . sodium chloride     sodium chloride, sodium chloride, sodium chloride, acetaminophen **OR** acetaminophen, albuterol, alteplase, heparin, hydrocortisone cream, HYDROmorphone, hydrOXYzine, lidocaine (PF), lidocaine-prilocaine, minoxidil, MUSCLE RUB, ondansetron, oxyCODONE-acetaminophen, pentafluoroprop-tetrafluoroeth, promethazine, senna, simethicone, sodium chloride flush, zolpidem  Exam: Alert, no distress No jvd Chest clear bilat RRR no mrg Abd soft ntnd +bs Ext 2+ bilat pitting LE edema bilat flank edema, dependent R IJ TDC/ dressing LUA NF, Ox 3  Dialysis: [facility] 4h 72.5kg 2/2 Hep 3000 LUA AVF -mircera 200 ug next 11/29 -venofer load 7 doses left 100 mg -sensipar 120 mg tiw w/ HD -left over edw last 4 HD (76 - 81kg post) -s/o early about 50% of session -Hb 9.5, tsat 20%, Ca 10.7, P 4, pth 1475 -home BP Rx : minoxidil 10 mg bid prn when bp reads "above normal"        Impression: 1. LUA AVF edema/blistering: S/P ligation of AVF/Evacuation of hematoma. Per VVS wound is open 7 x7 cm w/ viable muscle. Will likely need skin graft at some time.  2. Acute SOB/ resp distress - resolved 3. Vol overload -still up  10kg , anasarca, have d/w pt importance of fluid restriction 4. HTN/ vol - BP's controlled, off medication (home minoxidil prn) 5. ESRD HD MWF. HD today 6. Hepatitis B + - HD in isolation  7. Hx hep C- s/p treatment 8. Hx substance abuse- claims abstinence 9. Anemia- hgb 8.8>>7.5>>6.7 after surg- on max mircera due 11/29- gave darbe here. SP 11units prbc's and 2 pks of platelets.  Hb 8- 9 now.  10. MBD of CKD - rocatrol, renvela and sensipar- all cont from home- no recent labs 59. Thrombocytopenia- rebounded but now down again - HIT pending- no hep on HD 12. Dispo- working on changing centers may delay DC as noted above. CLIP process initiated.  Has not been accepted elsewhere yet.   13. Transplant referral - pt says he has been off drugs for about a year and really wants to have an appt at a transplant center.  Have d/w Dr Joelyn Oms who said he will go ahead and make a referral now for Mr Bogus to a transplant center.  14. Suicidal ideation - appreciated assist of chaplain    Plan - HD today, max UF , fluid restrict, trying to CLIP to another center   [name] MD [facility] pager [phone]   06/08/2017, 10:37 AM   Recent Labs  Lab 06/06/17  8588 06/07/17 0214 06/08/17 0651  NA 127* 128* 128*  K 6.0* 5.3* 5.8*  CL 90* 92* 92*  CO2 27 24 25   GLUCOSE 100* 97 111*  BUN 43* 31* 46*  CREATININE 7.69* 5.79* 7.64*  CALCIUM 9.1 9.3 9.1  PHOS  --   --  5.0*   Recent Labs  Lab 06/04/17 1518 06/08/17 0651  AST 38  --   ALT 21  --   ALKPHOS 96  --   BILITOT 1.0  --   PROT 5.2*  --   ALBUMIN 2.9* 3.1*   Recent Labs  Lab 06/03/17 1911 06/04/17 0332 06/04/17 2306  06/06/17 0552 06/07/17 0214 06/08/17 0650  WBC 14.3* 11.4* 11.2*   < > 11.1* 11.6* 12.0*  NEUTROABS 12.7* 8.8* 7.7  --   --   --   --   HGB 6.4* 6.7* 6.7*   < > 8.2* 8.7* 8.4*  HCT 18.4* 18.8* 19.3*   < > 23.9* 25.6* 25.2*  MCV 84.8 85.5 86.5   < > 88.2 89.8 89.0  PLT 125* 62* 71*   < > 79*  69* 93*   < > = values in this interval not displayed.   Iron/TIBC/Ferritin/ %Sat    Component Value Date/Time   IRON 116 06/25/2011 0837   TIBC 277 06/25/2011 0837   FERRITIN 550 (H) 06/25/2011 0837   IRONPCTSAT 42 06/25/2011 5027

## 2017-06-08 NOTE — Progress Notes (Signed)
  Progress Note    06/08/2017 3:15 PM 3 Days Post-Op  Subjective:  Irritable about psychiatry visit today.  Also irritable and upset with the way dressing was changed during exam.   Vitals:   06/08/17 1100 06/08/17 1114  BP: (!) 150/72 (!) 169/83  Pulse: 80 91  Resp:  17  Temp:  99.5 F (37.5 C)  SpO2:     Physical Exam: Cardiac:  Irregular Lungs:  No increased respiratory effort Incisions:  L upper arm incision dressing changed; serosanguinous collection on kerlex; minimal thin active serosanguinous drainage; healthy wound bed without purulence noted; palpable L radial pulse Abdomen:  soft Neurologic: A&O  CBC    Component Value Date/Time   WBC 12.0 (H) 06/08/2017 0650   RBC 2.83 (L) 06/08/2017 0650   HGB 8.4 (L) 06/08/2017 0650   HCT 25.2 (L) 06/08/2017 0650   PLT 93 (L) 06/08/2017 0650   MCV 89.0 06/08/2017 0650   MCH 29.7 06/08/2017 0650   MCHC 33.3 06/08/2017 0650   RDW 16.5 (H) 06/08/2017 0650   LYMPHSABS 1.6 06/04/2017 2306   MONOABS 1.7 (H) 06/04/2017 2306   EOSABS 0.1 06/04/2017 2306   BASOSABS 0.0 06/04/2017 2306    BMET    Component Value Date/Time   NA 128 (L) 06/08/2017 0651   K 5.8 (H) 06/08/2017 0651   CL 92 (L) 06/08/2017 0651   CO2 25 06/08/2017 0651   GLUCOSE 111 (H) 06/08/2017 0651   BUN 46 (H) 06/08/2017 0651   CREATININE 7.64 (H) 06/08/2017 0651   CREATININE 9.18 (H) 02/15/2017 1425   CALCIUM 9.1 06/08/2017 0651   CALCIUM 8.1 (L) 07/11/2011 1117   GFRNONAA 7 (L) 06/08/2017 0651   GFRNONAA 6 (L) 02/15/2017 1425   GFRAA 8 (L) 06/08/2017 0651   GFRAA 7 (L) 02/15/2017 1425    INR    Component Value Date/Time   INR 1.20 06/04/2017 2306     Intake/Output Summary (Last 24 hours) at 06/08/2017 1515 Last data filed at 06/08/2017 1114 Gross per 24 hour  Intake 480 ml  Output 4500 ml  Net -4020 ml     Assessment/Plan:  56 y.o. male is s/p Exploration of left upper arm wound for bleeding and ligation of left brachiocephalic  fistula at proximal end 3 Days Post-Op   Dressing changed today during exam; hydrogel dressing changes daily and prn Ok to restart plavix D/c planning per case management HD from PermCath per Nephrology   Dagoberto Ligas, PA-C [facility] [phone] 06/08/2017 3:15 PM

## 2017-06-08 NOTE — Consult Note (Signed)
[facility] Psychiatry Consult   Reason for Consult:  Suicide risk assessment Referring Physician:  Dr. Lonny Prude Patient Identification: [name] MRN:  [id number] Principal Diagnosis: MDD (major depressive disorder), single episode, moderate ([place]) Diagnosis:   Patient Active Problem List   Diagnosis Date Noted  . Dyspnea [R06.00] 05/29/2017  . Tachycardia [R00.0] 05/29/2017  . Hyperkalemia [E87.5] 05/22/2017  . Acute metabolic encephalopathy [B28.41]   . Anemia [D64.9] 04/23/2017  . Ascites [R18.8] 04/23/2017  . COPD (chronic obstructive pulmonary disease) ([place]) [J44.9] 04/23/2017  . Acute on chronic respiratory failure with hypoxia ([place]) [J96.21] 03/25/2017  . Atrial flutter ([place]) [I48.92] 03/25/2017  . Arrhythmia [I49.9] 03/25/2017  . COPD GOLD II/ still smoking [J44.9] 09/27/2016  . Essential hypertension [I10] 09/27/2016  . Fluid overload [E87.70] 08/30/2016  . COPD exacerbation ([place]) [J44.1] 08/17/2016  . Hypertensive urgency [I16.0] 08/17/2016  . Respiratory failure ([place]) [J96.90] 08/17/2016  . Problem with dialysis access [facility]) [T82.898A] 07/23/2016  . End stage renal disease ([place]) [N18.6] 12/02/2015  . Chronic hepatitis B ([place]) [B18.1] 03/05/2014  . Chronic hepatitis C without hepatic coma ([place]) [B18.2] 03/05/2014  . Internal hemorrhoids with bleeding, swelling and itching [K64.8] 03/05/2014  . Thrombocytopenia ([place]) [D69.6] 03/05/2014  . Chest pain [R07.9] 02/27/2014  . Alcohol abuse [F10.10] 04/14/2009  . Cigarette smoker [F17.210] 04/14/2009  . GANGLION CYST [M67.40] 04/14/2009    Total Time spent with patient: 1 hour  Subjective:   [name] is a 56 y.o. male patient admitted with LUE AVF infiltration.  HPI:   Per chart review, patient has a history of anxiety and cocaine abuse. He was seen by the chaplain yesterday. He reported SI with a plan to commit suicide by using a match and gas. He reported carrying a burden related to his family  dynamics for 7 years.   [name] was irritable and poorly participated in the interview. He selectively chose which questions to answer and became upset when asked about SI and reasons for SI. He was threatening at times. He reports a poor interaction with the chaplain and felt like he was in court and the chaplain was the acting like the judge. He does not want to meet with his again and wanted to send him "out on his tail" at his last visit. He focused on his feelings of mistreatment by his primary care team as well. He was reminded that everyone who is caring for him is only trying to help him feel better.   In regards to mood, he reports "I don't give a damn anymore." He was asked about specific thoughts about suicide and became angry and refused to answer the questions. He was informed of the importance of the question to see how this notewriter could help him but he still refused to answer the question. He did report that he has been suicidal for years but no further information could be obtained about what has been helpful for him to not attempt suicide or if he had a plan in the past like he recently endorsed in the hospital. He reports family stressors. He is not close to his family. His son has resentment towards him for not being there when he was younger due to alcohol abuse. He denies wanting a relationship with his family now because it has been so long and he does not know who he can trust. His sitter reports that he mentioned that his siblings do not speak to one another and this has contributed to [name].  He enjoys talking to his neighbor but he denies a strong support system. He reports poor appetite and weight gain. He denies doing activities that are enjoyable and he mostly stays at home. He denies problems with sleep or appetite. He occasionally has nightmares.    Past Psychiatric History: Chronic SI  Risk to Self: Patient recently endorsed SI with plan to treatment team.  Risk to  Others:  None. Denies HI.  Prior Inpatient Therapy:  Denies  Prior Outpatient Therapy:  Denies   Past Medical History:  Past Medical History:  Diagnosis Date  . Adenomatous colon polyp    tubular  . Anemia   . Anxiety   . Arthritis    left shoulder  . Atherosclerosis of aorta ([place])   . Cardiomegaly   . Chest pain    DATE UNKNOWN, C/O PERIODICALLY  . Chronic kidney disease    dialysis - M/W/F  . Cocaine abuse ([place])   . COPD exacerbation ([place]) 08/17/2016  . Dialysis patient ([place])    Monday-Wednesday-Friday  . GERD (gastroesophageal reflux disease)    DATE UNKNOWN  . Hemorrhoids   . Hepatitis B, chronic ([place])   . Hepatitis C   . Hyperkalemia   . Hypertension   . Metabolic bone disease    Patient denies  . Nephrolithiasis   . Pneumonia   . Pulmonary edema   . Renal disorder   . Renal insufficiency   . Shortness of breath dyspnea    " for the last past year with this dialysis"  . Tubular adenoma of colon     Past Surgical History:  Procedure Laterality Date  . A/V FISTULAGRAM Left 05/26/2017   Procedure: A/V FISTULAGRAM;  Surgeon: Conrad Lincoln, MD;  Location: [facility];  Service: Cardiovascular;  Laterality: Left;  . AV FISTULA PLACEMENT  2012   BELIEVED WAS PLACED IN JUNE  . COLONOSCOPY    . CORONARY STENT INTERVENTION N/A 02/22/2017   Procedure: CORONARY STENT INTERVENTION;  Surgeon: Nigel Mormon, MD;  Location: [place] [facility];  Service: Cardiovascular;  Laterality: N/A;  . HEMORRHOID BANDING    . I&D EXTREMITY Left 06/01/2017   Procedure: IRRIGATION AND DEBRIDEMENT LEFT ARM HEMATOMA WITH LIGATION OF LEFT ARM AV FISTULA;  Surgeon: Elam Dutch, MD;  Location: [place];  Service: Vascular;  Laterality: Left;  . INSERTION OF DIALYSIS CATHETER  05/30/2017  . INSERTION OF DIALYSIS CATHETER N/A 05/30/2017   Procedure: INSERTION OF DIALYSIS CATHETER;  Surgeon: Elam Dutch, MD;  Location[facility] OR;  Service: Vascular;  Laterality: N/A;  . LEFT  HEART CATH AND CORONARY ANGIOGRAPHY N/A 02/22/2017   Procedure: LEFT HEART CATH AND CORONARY ANGIOGRAPHY;  Surgeon: Nigel Mormon, MD;  Location: [facility];  Service: Cardiovascular;  Laterality: N/A;  . LIGATION OF ARTERIOVENOUS  FISTULA Left 12/10/5181   Procedure: Plication of Left Arm Arteriovenous Fistula;  Surgeon: Elam Dutch, MD;  Location: [facility];  Service: Vascular;  Laterality: Left;  . POLYPECTOMY    . REVISON OF ARTERIOVENOUS FISTULA Left 4/37/3578   Procedure: PLICATION OF DISTAL ANEURYSMAL SEGEMENT OF LEFT UPPER ARM ARTERIOVENOUS FISTULA;  Surgeon: Elam Dutch, MD;  Location: [facility];  Service: Vascular;  Laterality: Left;  . REVISON OF ARTERIOVENOUS FISTULA Left 9/78/4784   Procedure: Plication of Left Upper Arm Fistula ;  Surgeon: Waynetta Sandy, MD;  Location: [facility];  Service: Vascular;  Laterality: Left;  . THROMBECTOMY W/ EMBOLECTOMY Left 06/05/2017  Procedure: EXPLORATION OF LEFT ARM FOR BLEEDING; OVERSEWED PROXIMAL FISTULA;  Surgeon: Angelia Mould, MD;  Location: [facility];  Service: Vascular;  Laterality: Left;  . WOUND EXPLORATION Left 06/03/2017   Procedure: WOUND EXPLORATION WITH WOUND VAC APPLICATION TO LEFT ARM;  Surgeon: Angelia Mould, MD;  Location: [facility] OR;  Service: Vascular;  Laterality: Left;   Family History:  Family History  Problem Relation Age of Onset  . Heart disease Mother   . Lung cancer Mother   . Heart disease Father   . Malignant hyperthermia Father   . COPD Father   . Throat cancer Sister   . Esophageal cancer Sister   . Hypertension Other   . COPD Other   . Colon cancer Neg Hx   . Colon polyps Neg Hx   . Rectal cancer Neg Hx   . Stomach cancer Neg Hx    Family Psychiatric  History: Unknown  Social History:  Social History   Substance and Sexual Activity  Alcohol Use Yes  . Alcohol/week: 2.4 oz  . Types: 1 Glasses of wine, 1 Cans of beer, 1 Shots of liquor, 1 Standard drinks or equivalent  per week     Social History   Substance and Sexual Activity  Drug Use Yes  . Types: Marijuana, Cocaine    Social History   Socioeconomic History  . Marital status: Single    Spouse name: None  . Number of children: 3  . Years of education: 10  . Highest education level: None  Social Needs  . Financial resource strain: None  . Food insecurity - worry: None  . Food insecurity - inability: None  . Transportation needs - medical: None  . Transportation needs - non-medical: None  Occupational History  . Occupation: Unemployed  Tobacco Use  . Smoking status: Current Every Day Smoker    Packs/day: 0.50    Years: 43.00    Pack years: 21.50    Types: Cigarettes    Start date: 08/13/1973  . Smokeless tobacco: Never Used  . Tobacco comment: Less than 1/2 pk per day  Substance and Sexual Activity  . Alcohol use: Yes    Alcohol/week: 2.4 oz    Types: 1 Glasses of wine, 1 Cans of beer, 1 Shots of liquor, 1 Standard drinks or equivalent per week  . Drug use: Yes    Types: Marijuana, Cocaine  . Sexual activity: None    Comment: "NOT LATELY" on cocaine  Other Topics Concern  . None  Social History Narrative   Lives alone   Caffeine use: Coffee-rare   Soda- daily      [facility] (03/10/17):   Originally from [facility]. Previously worked trimming trees. No pets currently. No bird or mold exposure.    Additional Social History: He lives at home alone. He has an estranged relationship with his children and siblings. He is a recovering alcoholic. He denies current use although records indicate the contrary. He denies illicit substance use.     Allergies:   Allergies  Allergen Reactions  . Aspirin Other (See Comments)    STOMACH PAIN  . Clonidine Derivatives Itching  . Tramadol Itching    Labs:  Results for orders placed or performed during the hospital encounter of 05/29/17 (from the past 48 hour(s))  CBC     Status: Abnormal   Collection Time: 06/07/17  2:14 AM  Result  Value Ref Range   WBC 11.6 (H) 4.0 - 10.5 K/uL   RBC 2.85 (  L) 4.22 - 5.81 MIL/uL   Hemoglobin 8.7 (L) 13.0 - 17.0 g/dL   HCT 25.6 (L) 39.0 - 52.0 %   MCV 89.8 78.0 - 100.0 fL   MCH 30.5 26.0 - 34.0 pg   MCHC 34.0 30.0 - 36.0 g/dL   RDW 17.0 (H) 11.5 - 15.5 %   Platelets 69 (L) 150 - 400 K/uL    Comment: CONSISTENT WITH PREVIOUS RESULT  Basic metabolic panel     Status: Abnormal   Collection Time: 06/07/17  2:14 AM  Result Value Ref Range   Sodium 128 (L) 135 - 145 mmol/L   Potassium 5.3 (H) 3.5 - 5.1 mmol/L   Chloride 92 (L) 101 - 111 mmol/L   CO2 24 22 - 32 mmol/L   Glucose, Bld 97 65 - 99 mg/dL   BUN 31 (H) 6 - 20 mg/dL   Creatinine, Ser 5.79 (H) 0.61 - 1.24 mg/dL   Calcium 9.3 8.9 - 10.3 mg/dL   GFR calc non Af Amer 10 (L) >60 mL/min   GFR calc Af Amer 12 (L) >60 mL/min    Comment: (NOTE) The eGFR has been calculated using the CKD EPI equation. This calculation has not been validated in all clinical situations. eGFR's persistently <60 mL/min signify possible Chronic Kidney Disease.    Anion gap 12 5 - 15  CBC     Status: Abnormal   Collection Time: 06/08/17  6:50 AM  Result Value Ref Range   WBC 12.0 (H) 4.0 - 10.5 K/uL   RBC 2.83 (L) 4.22 - 5.81 MIL/uL   Hemoglobin 8.4 (L) 13.0 - 17.0 g/dL   HCT 25.2 (L) 39.0 - 52.0 %   MCV 89.0 78.0 - 100.0 fL   MCH 29.7 26.0 - 34.0 pg   MCHC 33.3 30.0 - 36.0 g/dL   RDW 16.5 (H) 11.5 - 15.5 %   Platelets 93 (L) 150 - 400 K/uL    Comment: PLATELET COUNT CONFIRMED BY SMEAR  Renal function panel     Status: Abnormal   Collection Time: 06/08/17  6:51 AM  Result Value Ref Range   Sodium 128 (L) 135 - 145 mmol/L   Potassium 5.8 (H) 3.5 - 5.1 mmol/L   Chloride 92 (L) 101 - 111 mmol/L   CO2 25 22 - 32 mmol/L   Glucose, Bld 111 (H) 65 - 99 mg/dL   BUN 46 (H) 6 - 20 mg/dL   Creatinine, Ser 7.64 (H) 0.61 - 1.24 mg/dL   Calcium 9.1 8.9 - 10.3 mg/dL   Phosphorus 5.0 (H) 2.5 - 4.6 mg/dL   Albumin 3.1 (L) 3.5 - 5.0 g/dL   GFR calc non  Af Amer 7 (L) >60 mL/min   GFR calc Af Amer 8 (L) >60 mL/min    Comment: (NOTE) The eGFR has been calculated using the CKD EPI equation. This calculation has not been validated in all clinical situations. eGFR's persistently <60 mL/min signify possible Chronic Kidney Disease.    Anion gap 11 5 - 15    Current Facility-Administered Medications  Medication Dose Route Frequency Provider Last Rate Last Dose  . 0.9 %  sodium chloride infusion  250 mL Intravenous PRN Jani Gravel, MD      . 0.9 %  sodium chloride infusion   Intravenous Continuous Audry Pili, MD 10 mL/hr at 06/01/17 1715    . 0.9 %  sodium chloride infusion   Intravenous Continuous Roberts Gaudy, MD 10 mL/hr at 06/03/17 [phone]    .  0.9 %  sodium chloride infusion  100 mL Intravenous PRN Roney Jaffe, MD      . 0.9 %  sodium chloride infusion  100 mL Intravenous PRN Roney Jaffe, MD      . acetaminophen (TYLENOL) tablet 650 mg  650 mg Oral Q6H PRN Jani Gravel, MD   650 mg at 06/07/17 1541   Or  . acetaminophen (TYLENOL) suppository 650 mg  650 mg Rectal Q6H PRN Jani Gravel, MD      . albuterol (PROVENTIL) (2.5 MG/3ML) 0.083% nebulizer solution 3 mL  3 mL Inhalation Q2H PRN Joette Catching T, MD      . alteplase (CATHFLO ACTIVASE) injection 2 mg  2 mg Intracatheter Once PRN Roney Jaffe, MD      . calcitRIOL (ROCALTROL) capsule 0.75 mcg  0.75 mcg Oral Q M,W,F-HD Jani Gravel, MD   0.75 mcg at 06/06/17 1935  . cinacalcet (SENSIPAR) tablet 120 mg  120 mg Oral Q M,W,F-HD Corliss Parish, MD      . Darbepoetin Alfa (ARANESP) injection 150 mcg  150 mcg Intravenous Q Fri-HD Corliss Parish, MD   150 mcg at 06/03/17 2305  . gabapentin (NEURONTIN) capsule 100 mg  100 mg Oral BID Jani Gravel, MD   100 mg at 06/07/17 2143  . heparin injection 1,000 Units  1,000 Units Dialysis PRN Roney Jaffe, MD      . hydrocortisone cream 0.5 %   Topical QID PRN Cherene Altes, MD      . HYDROmorphone (DILAUDID) tablet 4 mg  4 mg  Oral Q3H PRN Vianne Bulls, MD   4 mg at 06/07/17 2144  . hydrOXYzine (ATARAX/VISTARIL) tablet 25 mg  25 mg Oral Q6H PRN Dessa Phi, DO   25 mg at 06/05/17 1341  . lidocaine (PF) (XYLOCAINE) 1 % injection 5 mL  5 mL Intradermal PRN Roney Jaffe, MD      . lidocaine-prilocaine (EMLA) cream 1 application  1 application Topical PRN Roney Jaffe, MD      . minoxidil (LONITEN) tablet 5 mg  5 mg Oral BID PRN Corliss Parish, MD   5 mg at 06/04/17 2132  . MUSCLE RUB CREA   Topical PRN Dessa Phi, DO      . nicotine (NICODERM CQ - dosed in mg/24 hours) patch 21 mg  21 mg Transdermal Daily Dessa Phi, DO   21 mg at 06/07/17 0917  . ondansetron (ZOFRAN) tablet 4 mg  4 mg Oral Q8H PRN Jani Gravel, MD   4 mg at 06/06/17 1328  . oxyCODONE-acetaminophen (PERCOCET/ROXICET) 5-325 MG per tablet 1-2 tablet  1-2 tablet Oral Q4H PRN Opyd, Ilene Qua, MD   2 tablet at 06/07/17 1659  . pentafluoroprop-tetrafluoroeth (GEBAUERS) aerosol 1 application  1 application Topical PRN Roney Jaffe, MD      . promethazine (PHENERGAN) injection 12.5 mg  12.5 mg Intravenous Q6H PRN Dessa Phi, DO      . senna (SENOKOT) tablet 8.6 mg  1 tablet Oral Daily PRN Dessa Phi, DO   8.6 mg at 06/07/17 2143  . sevelamer carbonate (RENVELA) tablet 3,200 mg  3,200 mg Oral TID WC Jani Gravel, MD   3,200 mg at 06/07/17 1659  . simethicone (MYLICON) chewable tablet 80 mg  80 mg Oral QID PRN Vertis Kelch, NP   80 mg at 06/06/17 2235  . sodium chloride flush (NS) 0.9 % injection 3 mL  3 mL Intravenous Q12H Jani Gravel, MD   3 mL at 06/07/17 2155  .  sodium chloride flush (NS) 0.9 % injection 3 mL  3 mL Intravenous PRN Jani Gravel, MD      . zolpidem Lorrin Mais) tablet 10 mg  10 mg Oral QHS PRN Corliss Parish, MD   10 mg at 06/07/17 2144    Musculoskeletal: Strength & Muscle Tone: within normal limits Gait & Station: normal Patient leans: N/A  Psychiatric Specialty Exam: Physical Exam  Nursing note  and vitals reviewed. Constitutional: He is oriented to person, place, and time. He appears well-developed and well-nourished.  HENT:  Head: Normocephalic and atraumatic.  Neck: Normal range of motion.  Respiratory: Effort normal.  Musculoskeletal: Normal range of motion.  Neurological: He is alert and oriented to person, place, and time.  Skin: No rash noted.  Psychiatric: His speech is normal. Thought content normal. His affect is labile. He is agitated. Cognition and memory are normal. He expresses impulsivity.    Review of Systems  Constitutional: Negative for chills and fever.  Cardiovascular: Negative for chest pain.  Gastrointestinal: Negative for constipation, diarrhea, nausea and vomiting.  Psychiatric/Behavioral: Positive for depression and suicidal ideas. Negative for hallucinations and substance abuse. The patient is not nervous/anxious and does not have insomnia.     Blood pressure 117/65, pulse (!) 109, temperature 98.8 F (37.1 C), temperature source Axillary, resp. rate 12, height '5\' 9"'$  (1.753 m), weight 83.8 kg (184 lb 11.9 oz), SpO2 94 %.Body mass index is 27.28 kg/m.  General Appearance: Well Groomed, middle aged, African American male who is lying in bed with a his leg arm bandaged. NAD.   Eye Contact:  Fair  Speech:  Clear and Coherent and Normal Rate  Volume:  Increased  Mood:  "I don't give a damn anymore."  Affect:  Labile  Thought Process:  Goal Directed and Linear  Orientation:  Full (Time, Place, and Person)  Thought Content:  Logical  Suicidal Thoughts:  Yes.  with intent/plan  Homicidal Thoughts:  No  Memory:  Immediate;   Fair Recent;   Fair Remote;   Fair  Judgement:  Poor. He is unwilling to participate in interview to allow this notewriter to evaluate his mental health for appropriate treatment.   Insight:  Poor.   Psychomotor Activity:  Increased due to agitation. He was raising his voice and using non verbal gestures.   Concentration:   Concentration: Fair and Attention Span: Fair  Recall:  [employer] of Knowledge:  Fair  Language:  Fair  Akathisia:  No  Handed:  Right  AIMS (if indicated):   N/A  Assets:  Housing  ADL's:  Intact  Cognition:  WNL  Sleep:   Okay   Assessment: [name] is a 56 y.o. male who was admitted with LUE AVF infiltration. During his hospitalization, he reported SI with a plan so psychiatry was consulted. He was irritable and poorly cooperative with interview today. He reported that he did not care anymore when asked about his mood but he refused to answer specific questions regarding SI. He previously endorsed SI to his treatment team and reported a plan to use a match and gas. He endorses depressive symptoms and appears to have minimal social support. He has an estranged relationship with his family. He exhibits cluster b traits (antisocial personality disorder) which make it difficult to engage with patient. He warrants inpatient psychiatric hospitalization after he is medically cleared since he is unwilling to participate in interview and therefore there is no way to properly evaluate his suicide risk and/or safety  plan.   Treatment Plan Summary: -Patient warrants inpatient psychiatric hospitalization given high risk of harm to self. -Continue bedside sitter.  -Provide Ativan PO/IV if patient becomes an imminent risk of harm to self or others secondary to agitation. Avoid use of QTc prolonging agents like antipsychotics. Recent QTc 523 on 11/18. -Please pursue involuntary commitment if patient refuses voluntary psychiatric hospitalization or attempts to leave the hospital.  -Will sign off on patient at this time. Please consult psychiatry again as needed.    Disposition: Recommend psychiatric Inpatient admission when medically cleared.   Faythe Dingwall, DO 06/08/2017 8:52 AM

## 2017-06-09 ENCOUNTER — Telehealth: Payer: Self-pay | Admitting: Vascular Surgery

## 2017-06-09 ENCOUNTER — Ambulatory Visit (HOSPITAL_COMMUNITY): Admission: RE | Admit: 2017-06-09 | Payer: Medicare Other | Source: Ambulatory Visit | Admitting: Vascular Surgery

## 2017-06-09 ENCOUNTER — Telehealth: Payer: Self-pay | Admitting: *Deleted

## 2017-06-09 ENCOUNTER — Ambulatory Visit: Payer: Self-pay | Admitting: Pulmonary Disease

## 2017-06-09 ENCOUNTER — Encounter (HOSPITAL_COMMUNITY)
Admission: EM | Disposition: A | Discharge status: Other Acute Inpatient Hospital | Payer: Self-pay | Source: Home / Self Care | Attending: Internal Medicine

## 2017-06-09 DIAGNOSIS — R0602 Shortness of breath: Secondary | ICD-10-CM

## 2017-06-09 LAB — BASIC METABOLIC PANEL
Anion gap: 11 (ref 5–15)
BUN: 32 mg/dL — ABNORMAL HIGH (ref 6–20)
CO2: 26 mmol/L (ref 22–32)
Calcium: 7.8 mg/dL — ABNORMAL LOW (ref 8.9–10.3)
Chloride: 92 mmol/L — ABNORMAL LOW (ref 101–111)
Creatinine, Ser: 6.32 mg/dL — ABNORMAL HIGH (ref 0.61–1.24)
GFR calc Af Amer: 10 mL/min — ABNORMAL LOW (ref >60)
GFR calc non Af Amer: 9 mL/min — ABNORMAL LOW (ref >60)
Glucose, Bld: 111 mg/dL — ABNORMAL HIGH (ref 65–99)
Potassium: 4.9 mmol/L (ref 3.5–5.1)
Sodium: 129 mmol/L — ABNORMAL LOW (ref 135–145)

## 2017-06-09 LAB — CBC
HCT: 25.4 % — ABNORMAL LOW (ref 39.0–52.0)
Hemoglobin: 8.4 g/dL — ABNORMAL LOW (ref 13.0–17.0)
MCH: 29.8 pg (ref 26.0–34.0)
MCHC: 33.1 g/dL (ref 30.0–36.0)
MCV: 90.1 fL (ref 78.0–100.0)
Platelets: 110 10*3/uL — ABNORMAL LOW (ref 150–400)
RBC: 2.82 MIL/uL — ABNORMAL LOW (ref 4.22–5.81)
RDW: 17.3 % — ABNORMAL HIGH (ref 11.5–15.5)
WBC: 10.3 10*3/uL (ref 4.0–10.5)

## 2017-06-09 SURGERY — ARTERIOVENOUS (AV) FISTULA CREATION
Anesthesia: Monitor Anesthesia Care | Laterality: Left

## 2017-06-09 NOTE — BH Assessment (Signed)
Patient has been accepted to [facility]  Accepting physician is Dr. Bary Leriche.  Attending Physician will be Dr. Bary Leriche.  Patient has been assigned to room 325, by [place].  Call report to [phone].  Representative/Transfer Coordinator is Dispensing optician Patient pre-admitted by [facility] Patient Access Genella Rife)  Staff Manuela Schwartz, Social Worker) made aware of acceptance.

## 2017-06-09 NOTE — Discharge Instructions (Signed)
Left arm wound care hydrogel to wound base, saline wet guaze, Xeroform non stick guaze to surrounding superficial skin wounds and cover with dry guaze Kerlex and ace mild compression.  Wet guaze with saline prior to removing dressing for daily changes.  We do not want to cause bleeding.

## 2017-06-09 NOTE — Telephone Encounter (Signed)
-----   Message from Mena Goes, RN sent at 06/09/2017  8:30 AM EST ----- Regarding: 2 weeks with Dr. Oneida Alar   ----- Message ----- From: Ulyses Amor, PA-C Sent: 06/09/2017   8:24 AM To: [facility] Charge Pool  Assessment/Planning: S/P Incision and drainage of left arm hematoma with ligation of left arm AV fistula.  Multiple returns to the OR for episodes of uncontrol bleeding  Will need home health for wound care ( this was set up at the hospital)  Left arm wound care hydrogel to wound base, saline wet guaze, Xeroform non stick guaze to surrounding superficial skin wounds and cover with dry guaze Kerlex and ace mild compression.  Wet guaze with saline prior to removing dressing for daily changes.  We do not want to cause bleeding.  Follow up with me in 2 weeks  Dr. Oneida Alar

## 2017-06-09 NOTE — Progress Notes (Addendum)
[facility]  Subjective  - feels ok   Objective 113/69 78 97.9 F (36.6 C) (Oral) 18 100%  Intake/Output Summary (Last 24 hours) at 06/09/2017 0748 Last data filed at 06/09/2017 0998 Gross per 24 hour  Intake 360 ml  Output 4500 ml  Net -4140 ml   Left arm no further bleeding Has several areas of skin that are marginal in appearance but wound overall healing  Assessment/Planning: Ok for d/c from my standpoint Will need home health for wound care Left arm wound care hydrogel to wound base, saline wet guaze, Xeroform non stick guaze to surrounding superficial skin wounds and cover with dry guaze Kerlex and ace mild compression.  Wet guaze with saline prior to removing dressing for daily changes.  We do not want to cause bleeding. Follow up with me in 2 weeks  Ruta Hinds 06/09/2017 7:48 AM --  Laboratory Lab Results: Recent Labs    06/08/17 0650 06/09/17 0636  WBC 12.0* 10.3  HGB 8.4* 8.4*  HCT 25.2* 25.4*  PLT 93* 110*   BMET Recent Labs    06/08/17 0651 06/09/17 0636  NA 128* 129*  K 5.8* 4.9  CL 92* 92*  CO2 25 26  GLUCOSE 111* 111*  BUN 46* 32*  CREATININE 7.64* 6.32*  CALCIUM 9.1 7.8*    COAG Lab Results  Component Value Date   INR 1.20 06/04/2017   INR 1.17 06/03/2017   INR 1.14 06/02/2017   No results found for: PTT

## 2017-06-09 NOTE — Clinical Social Work Note (Signed)
Clinical Social Work Assessment  Patient Details  Name: [name] MRN: [id number] Date of Birth: 06-08-1961  Date of referral:  06/09/17               Reason for consult:  Suicide Risk/Attempt                Permission sought to share information with:    Permission granted to share information::     Name::        Agency::     Relationship::     Contact Information:     Housing/Transportation Living arrangements for the past 2 months:  Apartment Source of Information:  Patient Patient Interpreter Needed:  None Criminal Activity/Legal Involvement Pertinent to Current Situation/Hospitalization:  No - Comment as needed Significant Relationships:  Adult Children, Friend, Other Family Members Lives with:  Self Do you feel safe going back to the place where you live?  Yes Need for family participation in patient care:  No (Coment)  Care giving concerns: CSW consulted for inpatient psychiatric placement and IVC.  Social Worker assessment / plan: CSW met with patient at bedside. Patient now denies suicidal ideation and plan, reporting that he initially endorsed SI to get the chaplain out of the room when chaplain was visiting. Patient talked about family discord and plans to help reunite family members who have been estranged. CSW reviewed psych evaluation and recommendations. CSW reviewed process of inpatient psych referral; patient refused to sign voluntary consent form for inpatient treatment. CSW informed patient IVC would need to be placed based on psychiatrist recommendation for patient safety. Patient continued to indicate he would not go to inpatient treatment, stating that he is in his "right mind."   CSW consulted with MD and RNCM. MD completed IVC paperwork. CSW faxed IVC papers to [facility] and confirmed with magistrate that they were received. Awaiting for IVC to be served.  CSW referred patient to [facility]; [facility] will have bed for patient available when  IVC served. [facility] will accommodate patient's dialysis treatments.   Employment status:    Insurance information:  Medicare PT Recommendations:  Not assessed at this time Information / Referral to community resources:  Inpatient Psychiatric Care (Comment Required)  Patient/Family's Response to care: Patient resistant to psychiatric treatment.  Patient/Family's Understanding of and Emotional Response to Diagnosis, Current Treatment, and Prognosis: Patient resistant to psychiatric treatment and now denying previously reported suicidal ideation and plan.  Emotional Assessment Appearance:  Appears stated age Attitude/Demeanor/Rapport:  Uncooperative Affect (typically observed):  Frustrated, Irritable Orientation:  Oriented to Self, Oriented to Place, Oriented to  Time, Oriented to Situation Alcohol / Substance use:  Other(history cocaine use) Psych involvement (Current and /or in the community):  Yes (Comment)  Discharge Needs  Concerns to be addressed:  Care Coordination, Mental Health Concerns Readmission within the last 30 days:  No Current discharge risk:  Lives alone, Psychiatric Illness Barriers to Discharge:  Requiring sitter/restraints, Other(completing IVC and awaiting psych bed)   Joshua Emms, LCSW 06/09/2017, 2:45 PM

## 2017-06-09 NOTE — Care Management Important Message (Signed)
Important Message  Patient Details  Name: [name] MRN: [id number] Date of Birth: 10-28-1960   Medicare Important Message Given:  Yes    Yesika Rispoli Abena 06/09/2017, 9:00 AM

## 2017-06-09 NOTE — Progress Notes (Addendum)
[facility] Progress Note  Subjective: no new c/o  Vitals:   06/08/17 1114 06/08/17 1617 06/08/17 1936 06/09/17 0515  BP: (!) 169/83  122/76 113/69  Pulse: 91   78  Resp: 17 18    Temp: 99.5 F (37.5 C) 98.2 F (36.8 C) 97.9 F (36.6 C)   TempSrc: Oral  Oral   SpO2:    100%  Weight: 79.3 kg (174 lb 13.2 oz)   79.2 kg (174 lb 9.7 oz)  Height:        Inpatient medications: . calcitRIOL  0.75 mcg Oral Q M,W,F-HD  . cinacalcet  120 mg Oral Q M,W,F-HD  . clopidogrel  75 mg Oral Daily  . darbepoetin (ARANESP) injection - DIALYSIS  150 mcg Intravenous Q Fri-HD  . gabapentin  100 mg Oral BID  . nicotine  21 mg Transdermal Daily  . sevelamer carbonate  3,200 mg Oral TID WC  . sodium chloride flush  3 mL Intravenous Q12H   . sodium chloride    . sodium chloride 10 mL/hr at 06/01/17 1715  . sodium chloride 10 mL/hr at 06/03/17 0924   sodium chloride, acetaminophen **OR** acetaminophen, albuterol, hydrocortisone cream, HYDROmorphone, hydrOXYzine, minoxidil, MUSCLE RUB, ondansetron, oxyCODONE-acetaminophen, promethazine, senna, simethicone, sodium chloride flush, zolpidem  Exam: Alert, no distress No jvd Chest clear bilat RRR no mrg Abd soft ntnd +bs Ext 2+ bilat pitting LE edema bilat flank edema, dependent R IJ TDC/ dressing LUA NF, Ox 3  Dialysis: [facility] 4h 72.5kg 2/2 Hep 3000 LUA AVF -mircera 200 ug next 11/29 -venofer load 7 doses left 100 mg -sensipar 120 mg tiw w/ HD -left over edw last 4 HD (76 - 81kg post) -s/o early about 50% of session -Hb 9.5, tsat 20%, Ca 10.7, P 4, pth 1475 -home BP Rx : minoxidil 10 mg bid prn when bp reads "above normal"        Impression: 1. LUA AVF edema/blistering: S/P ligation of AVF/Evacuation of hematoma. Per VVS wound is open 7 x7 cm w/ viable muscle. Will likely need skin graft at some time. 2. Vol overload - improving, now up 7kg 3. HTN/ vol - BP's controlled, on home minoxidil dose 4. ESRD HD MWF. HD  tomorrow, max UF 6L  5. Hepatitis B + - HD in isolation  6. Hx hep C- s/p treatment 7. Hx substance abuse- claims abstinence 8. Anemia- hgb 8.8>>7.5>>6.7 after surg- on max mircera due 11/29- gave darbe here. SP 11units prbc's and 2 pks of platelets.  Hb 8- 9 now.  9. MBD of CKD - rocatrol, renvela and sensipar- all cont from home- no recent labs 10. Thrombocytopenia- much better 11. Dispo- working on changing centers may delay DC as noted above. CLIP process initiated.  Has not been accepted yet.   12. Transplant referral - spoke w Dr Joelyn Oms who said he would make refferall to transplant center for patient to be evaluated.  31. Suicidal ideation - see psychiatry notes. They say pt needs inpt psych hospitalization and should be IVC if tries to leave or refused voluntary psych hospitalization.  Patient today says he didn't mean it and that he's not suicidal , but he did it because his two sons were not speaking to one another for last 2 yrs and he thought that would bring them together.   14. Acute resp distress - on admit, resolved   Plan - HD tomorrow, max UF 6L   [name] MD [employer] pager [phone]  06/09/2017, 10:18 AM   Recent Labs  Lab 06/07/17 0214 06/08/17 0651 06/09/17 0636  NA 128* 128* 129*  K 5.3* 5.8* 4.9  CL 92* 92* 92*  CO2 24 25 26   GLUCOSE 97 111* 111*  BUN 31* 46* 32*  CREATININE 5.79* 7.64* 6.32*  CALCIUM 9.3 9.1 7.8*  PHOS  --  5.0*  --    Recent Labs  Lab 06/04/17 1518 06/08/17 0651  AST 38  --   ALT 21  --   ALKPHOS 96  --   BILITOT 1.0  --   PROT 5.2*  --   ALBUMIN 2.9* 3.1*   Recent Labs  Lab 06/03/17 1911 06/04/17 0332 06/04/17 2306  06/07/17 0214 06/08/17 0650 06/09/17 0636  WBC 14.3* 11.4* 11.2*   < > 11.6* 12.0* 10.3  NEUTROABS 12.7* 8.8* 7.7  --   --   --   --   HGB 6.4* 6.7* 6.7*   < > 8.7* 8.4* 8.4*  HCT 18.4* 18.8* 19.3*   < > 25.6* 25.2* 25.4*  MCV 84.8 85.5 86.5   < > 89.8 89.0 90.1  PLT 125* 62*  71*   < > 69* 93* 110*   < > = values in this interval not displayed.   Iron/TIBC/Ferritin/ %Sat    Component Value Date/Time   IRON 116 06/25/2011 0837   TIBC 277 06/25/2011 0837   FERRITIN 550 (H) 06/25/2011 0837   IRONPCTSAT 42 06/25/2011 5361

## 2017-06-09 NOTE — Progress Notes (Signed)
PROGRESS NOTE    [name]  XTK:[id number] DOB: 10/29/1960 DOA: 05/29/2017 PCP: [name], MD   Brief Narrative: [name] is a 56 y.o. male with past medical history of hypertension, cocaine abuse, hepatitis B/C, COPD, chronic systolic heart failure, ESRD on dialysis who presented with sudden swelling in LUE. He had a left brachiocephalic AVF shuntogram on 05/26/2017 which noted a patent fistula with >44mm small pseudoaneurysm proximally and moderate bilobe pseudoaneurysm in the mid segment and moderate sized broad pseudoaneurysm distally. He had a tunneled dialysis catheter placed by vascular surgery on 11/19.  He was taken to the OR on 11/21 for evacuation of hematoma of the left arm, on 11/23 for exploration of left upper arm, ligation of fistula, placement of VAC, on 11/25 exploration left arm wound for bleeding with ligation of left brachiocephalic fistula at proximal end.   Assessment & Plan:   Principal Problem:   MDD (major depressive disorder), single episode, moderate (HCC) Active Problems:   Chronic hepatitis C without hepatic coma (HCC)   Essential hypertension   COPD (chronic obstructive pulmonary disease) (HCC)   Hyperkalemia   Dyspnea   Tachycardia   Acute pulmonary edema Secondary to fluid overload. Resolved with dialysis.  LUE AVF hematoma S/p evacuation of hematoma on 11/21, exploration of left upper arm on 11/23 with ligation of his fistula and placement of VAC and exploration of left arm on 11/25 with ligation of brachiocephalic fistula. Required 11 units of PRBC. Stable now. -vascular surgery recommendations:   Left arm wound care hydrogel to wound base, saline wet guaze, Xeroform non stick guaze to surrounding superficial skin wounds and cover with dry guaze Kerlex and ace mild compression.  Wet guaze with saline prior to removing dressing for daily changes.  We do not want to cause bleeding.  Outpatient wound care  Follow up in 2  weeks  Acute blood loss anemia Secondary to hematoma. Required 11 units of PRBC. Hemoglobin stable.  Anemia of chronic disease Secondary to ESRD. -per nephrology  Acute on chronic thrombocytopenia Improved.  Elevated troponin No chest pain.  Atrial fibrillation/flutter S/p DCCV. Not on anticoagulation secondary to non-compliance.  ESRD on HD -Nephrology recommendations  Hepatitis B and C Substance abuse  Thrombocytopenia Improving.  Suicidal ideation Psychiatry recommending IVC and inpatient behavioral admission.   DVT prophylaxis: SCDs Code Status: Full code Family Communication: None at bedside Disposition Plan: Medically stable for discharge. Discharge to behavioral hearlth   Consultants:   Vascular surgery  Nephrology  Procedures:   Evacuation of hematoma (11/21)  Exploration of left upper arm with ligation of his fistula and placement of VAC (11/23)  Exploration of left arm with ligation of brachiocephalic fistula. (11/25)  Antimicrobials:  None    Subjective: Afebrile. No bleeding.  Objective: Vitals:   06/08/17 1617 06/08/17 1936 06/09/17 0515 06/09/17 1255  BP:  122/76 113/69 (!) 144/97  Pulse:   78 65  Resp: 18   18  Temp: 98.2 F (36.8 C) 97.9 F (36.6 C)  98 F (36.7 C)  TempSrc:  Oral  Oral  SpO2:   100% 95%  Weight:   79.2 kg (174 lb 9.7 oz)   Height:        Intake/Output Summary (Last 24 hours) at 06/09/2017 1504 Last data filed at 06/09/2017 1318 Gross per 24 hour  Intake 840 ml  Output -  Net 840 ml   Filed Weights   06/08/17 0725 06/08/17 1114 06/09/17 0515  Weight: 83.8 kg (184  lb 11.9 oz) 79.3 kg (174 lb 13.2 oz) 79.2 kg (174 lb 9.7 oz)    Examination:  General exam: Appears calm and comfortable Respiratory system: Respiratory effort normal. Gastrointestinal system: Abdomen is nondistended. Central nervous system: Sleeping Skin: No cyanosis. No rashes    Data Reviewed: I have personally reviewed  following labs and imaging studies  CBC: Recent Labs  Lab 06/03/17 1911 06/04/17 0332 06/04/17 2306 06/05/17 1423 06/06/17 0552 06/07/17 0214 06/08/17 0650 06/09/17 0636  WBC 14.3* 11.4* 11.2* 10.9* 11.1* 11.6* 12.0* 10.3  NEUTROABS 12.7* 8.8* 7.7  --   --   --   --   --   HGB 6.4* 6.7* 6.7* 8.6* 8.2* 8.7* 8.4* 8.4*  HCT 18.4* 18.8* 19.3* 25.1* 23.9* 25.6* 25.2* 25.4*  MCV 84.8 85.5 86.5 88.4 88.2 89.8 89.0 90.1  PLT 125* 62* 71* 74* 79* 69* 93* 993*   Basic Metabolic Panel: Recent Labs  Lab 06/04/17 2306 06/06/17 0552 06/07/17 0214 06/08/17 0651 06/09/17 0636  NA 130* 127* 128* 128* 129*  K 4.4 6.0* 5.3* 5.8* 4.9  CL 92* 90* 92* 92* 92*  CO2 28 27 24 25 26   GLUCOSE 118* 100* 97 111* 111*  BUN 31* 43* 31* 46* 32*  CREATININE 5.77* 7.69* 5.79* 7.64* 6.32*  CALCIUM 9.1 9.1 9.3 9.1 7.8*  PHOS  --   --   --  5.0*  --    GFR: Estimated Creatinine Clearance: 13.4 mL/min (A) (by C-G formula based on SCr of 6.32 mg/dL (H)). Liver Function Tests: Recent Labs  Lab 06/04/17 1518 06/08/17 0651  AST 38  --   ALT 21  --   ALKPHOS 96  --   BILITOT 1.0  --   PROT 5.2*  --   ALBUMIN 2.9* 3.1*   No results for input(s): LIPASE, AMYLASE in the last 168 hours. No results for input(s): AMMONIA in the last 168 hours. Coagulation Profile: Recent Labs  Lab 06/03/17 1911 06/04/17 2306  INR 1.17 1.20   Cardiac Enzymes: No results for input(s): CKTOTAL, CKMB, CKMBINDEX, TROPONINI in the last 168 hours. BNP (last 3 results) No results for input(s): PROBNP in the last 8760 hours. HbA1C: No results for input(s): HGBA1C in the last 72 hours. CBG: No results for input(s): GLUCAP in the last 168 hours. Lipid Profile: No results for input(s): CHOL, HDL, LDLCALC, TRIG, CHOLHDL, LDLDIRECT in the last 72 hours. Thyroid Function Tests: No results for input(s): TSH, T4TOTAL, FREET4, T3FREE, THYROIDAB in the last 72 hours. Anemia Panel: No results for input(s): VITAMINB12, FOLATE,  FERRITIN, TIBC, IRON, RETICCTPCT in the last 72 hours. Sepsis Labs: No results for input(s): PROCALCITON, LATICACIDVEN in the last 168 hours.  Recent Results (from the past 240 hour(s))  Surgical PCR screen     Status: None   Collection Time: 06/03/17  8:39 AM  Result Value Ref Range Status   MRSA, PCR NEGATIVE NEGATIVE Final   Staphylococcus aureus NEGATIVE NEGATIVE Final    Comment: (NOTE) The Xpert SA Assay (FDA approved for NASAL specimens in patients 69 years of age and older), is one component of a comprehensive surveillance program. It is not intended to diagnose infection nor to guide or monitor treatment.          Radiology Studies: No results found.      Scheduled Meds: . calcitRIOL  0.75 mcg Oral Q M,W,F-HD  . cinacalcet  120 mg Oral Q M,W,F-HD  . clopidogrel  75 mg Oral Daily  . darbepoetin (ARANESP)  injection - DIALYSIS  150 mcg Intravenous Q Fri-HD  . gabapentin  100 mg Oral BID  . nicotine  21 mg Transdermal Daily  . sevelamer carbonate  3,200 mg Oral TID WC  . sodium chloride flush  3 mL Intravenous Q12H   Continuous Infusions: . sodium chloride    . sodium chloride 10 mL/hr at 06/01/17 1715  . sodium chloride 10 mL/hr at 06/03/17 0924     LOS: 11 days     Cordelia Poche, MD [facility] 06/09/2017, 3:04 PM Pager: [phone]  If 7PM-7AM, please contact night-coverage [email or web address] Password [email or web address] 06/09/2017, 3:04 PM

## 2017-06-09 NOTE — Telephone Encounter (Signed)
-----   Message from Ulyses Amor, [place] sent at 06/09/2017  8:24 AM EST ----- Assessment/Planning: S/P Incision and drainage of left arm hematoma with ligation of left arm AV fistula.  Multiple returns to the OR for episodes of uncontrol bleeding  Will need home health for wound care ( this was set up at the hospital)  Left arm wound care hydrogel to wound base, saline wet guaze, Xeroform non stick guaze to surrounding superficial skin wounds and cover with dry guaze Kerlex and ace mild compression.  Wet guaze with saline prior to removing dressing for daily changes.  We do not want to cause bleeding.  Follow up with me in 2 weeks  Dr. Oneida Alar

## 2017-06-09 NOTE — Telephone Encounter (Signed)
Sched appt 06/30/17 at 1:30. Spoke to pt.

## 2017-06-09 NOTE — BH Assessment (Signed)
Writer spoke with Social Work Manuela Schwartz), state will transfer to [facility] tomorrow (06/10/2017) due to lack of transportation. At the time of the conversation, patient was waiting to be "served papers" from [facility], to be under IVC.

## 2017-06-10 ENCOUNTER — Inpatient Hospital Stay
Admission: AD | Admit: 2017-06-10 | Discharge: 2017-06-12 | DRG: 882 | Disposition: A | Discharge status: Other Acute Inpatient Hospital | Payer: Medicare Other | Attending: Psychiatry | Admitting: Psychiatry

## 2017-06-10 ENCOUNTER — Other Ambulatory Visit: Payer: Self-pay

## 2017-06-10 ENCOUNTER — Encounter: Payer: Self-pay | Admitting: Behavioral Health

## 2017-06-10 DIAGNOSIS — Z885 Allergy status to narcotic agent status: Secondary | ICD-10-CM

## 2017-06-10 DIAGNOSIS — R45851 Suicidal ideations: Secondary | ICD-10-CM

## 2017-06-10 DIAGNOSIS — K219 Gastro-esophageal reflux disease without esophagitis: Secondary | ICD-10-CM | POA: Diagnosis present

## 2017-06-10 DIAGNOSIS — S41102A Unspecified open wound of left upper arm, initial encounter: Secondary | ICD-10-CM | POA: Diagnosis not present

## 2017-06-10 DIAGNOSIS — K5900 Constipation, unspecified: Secondary | ICD-10-CM | POA: Diagnosis present

## 2017-06-10 DIAGNOSIS — T827XXA Infection and inflammatory reaction due to other cardiac and vascular devices, implants and grafts, initial encounter: Secondary | ICD-10-CM | POA: Diagnosis not present

## 2017-06-10 DIAGNOSIS — I132 Hypertensive heart and chronic kidney disease with heart failure and with stage 5 chronic kidney disease, or end stage renal disease: Secondary | ICD-10-CM | POA: Diagnosis present

## 2017-06-10 DIAGNOSIS — I4892 Unspecified atrial flutter: Secondary | ICD-10-CM | POA: Diagnosis present

## 2017-06-10 DIAGNOSIS — I509 Heart failure, unspecified: Secondary | ICD-10-CM | POA: Diagnosis present

## 2017-06-10 DIAGNOSIS — L0390 Cellulitis, unspecified: Secondary | ICD-10-CM | POA: Diagnosis not present

## 2017-06-10 DIAGNOSIS — Z7902 Long term (current) use of antithrombotics/antiplatelets: Secondary | ICD-10-CM

## 2017-06-10 DIAGNOSIS — Z992 Dependence on renal dialysis: Secondary | ICD-10-CM

## 2017-06-10 DIAGNOSIS — J449 Chronic obstructive pulmonary disease, unspecified: Secondary | ICD-10-CM | POA: Diagnosis present

## 2017-06-10 DIAGNOSIS — J9610 Chronic respiratory failure, unspecified whether with hypoxia or hypercapnia: Secondary | ICD-10-CM | POA: Diagnosis not present

## 2017-06-10 DIAGNOSIS — N186 End stage renal disease: Secondary | ICD-10-CM | POA: Diagnosis not present

## 2017-06-10 DIAGNOSIS — Z79899 Other long term (current) drug therapy: Secondary | ICD-10-CM | POA: Diagnosis not present

## 2017-06-10 DIAGNOSIS — Z886 Allergy status to analgesic agent status: Secondary | ICD-10-CM

## 2017-06-10 DIAGNOSIS — F17210 Nicotine dependence, cigarettes, uncomplicated: Secondary | ICD-10-CM | POA: Diagnosis present

## 2017-06-10 DIAGNOSIS — Z888 Allergy status to other drugs, medicaments and biological substances status: Secondary | ICD-10-CM

## 2017-06-10 DIAGNOSIS — B181 Chronic viral hepatitis B without delta-agent: Secondary | ICD-10-CM | POA: Diagnosis present

## 2017-06-10 DIAGNOSIS — S41102S Unspecified open wound of left upper arm, sequela: Secondary | ICD-10-CM

## 2017-06-10 DIAGNOSIS — G4700 Insomnia, unspecified: Secondary | ICD-10-CM | POA: Diagnosis present

## 2017-06-10 DIAGNOSIS — B182 Chronic viral hepatitis C: Secondary | ICD-10-CM | POA: Diagnosis present

## 2017-06-10 DIAGNOSIS — F4323 Adjustment disorder with mixed anxiety and depressed mood: Secondary | ICD-10-CM | POA: Diagnosis present

## 2017-06-10 DIAGNOSIS — D631 Anemia in chronic kidney disease: Secondary | ICD-10-CM | POA: Diagnosis not present

## 2017-06-10 DIAGNOSIS — M79602 Pain in left arm: Secondary | ICD-10-CM | POA: Diagnosis not present

## 2017-06-10 DIAGNOSIS — I10 Essential (primary) hypertension: Secondary | ICD-10-CM | POA: Diagnosis present

## 2017-06-10 DIAGNOSIS — X58XXXA Exposure to other specified factors, initial encounter: Secondary | ICD-10-CM | POA: Diagnosis not present

## 2017-06-10 DIAGNOSIS — I120 Hypertensive chronic kidney disease with stage 5 chronic kidney disease or end stage renal disease: Secondary | ICD-10-CM | POA: Diagnosis not present

## 2017-06-10 DIAGNOSIS — R600 Localized edema: Secondary | ICD-10-CM | POA: Diagnosis not present

## 2017-06-10 DIAGNOSIS — N2581 Secondary hyperparathyroidism of renal origin: Secondary | ICD-10-CM | POA: Diagnosis not present

## 2017-06-10 LAB — BASIC METABOLIC PANEL
Anion gap: 12 (ref 5–15)
Anion gap: 9 (ref 5–15)
BUN: 43 mg/dL — ABNORMAL HIGH (ref 6–20)
BUN: 17 mg/dL (ref 6–20)
CO2: 24 mmol/L (ref 22–32)
CO2: 29 mmol/L (ref 22–32)
Calcium: 8 mg/dL — ABNORMAL LOW (ref 8.9–10.3)
Calcium: 8.2 mg/dL — ABNORMAL LOW (ref 8.9–10.3)
Chloride: 88 mmol/L — ABNORMAL LOW (ref 101–111)
Chloride: 93 mmol/L — ABNORMAL LOW (ref 101–111)
Creatinine, Ser: 7.99 mg/dL — ABNORMAL HIGH (ref 0.61–1.24)
Creatinine, Ser: 4.2 mg/dL — ABNORMAL HIGH (ref 0.61–1.24)
GFR calc Af Amer: 8 mL/min — ABNORMAL LOW (ref >60)
GFR calc Af Amer: 17 mL/min — ABNORMAL LOW (ref >60)
GFR calc non Af Amer: 7 mL/min — ABNORMAL LOW (ref >60)
GFR calc non Af Amer: 15 mL/min — ABNORMAL LOW (ref >60)
Glucose, Bld: 88 mg/dL (ref 65–99)
Glucose, Bld: 119 mg/dL — ABNORMAL HIGH (ref 65–99)
Potassium: 5.6 mmol/L — ABNORMAL HIGH (ref 3.5–5.1)
Potassium: 4.2 mmol/L (ref 3.5–5.1)
Sodium: 124 mmol/L — ABNORMAL LOW (ref 135–145)
Sodium: 131 mmol/L — ABNORMAL LOW (ref 135–145)

## 2017-06-10 LAB — CBC
HCT: 24.8 % — ABNORMAL LOW (ref 39.0–52.0)
Hemoglobin: 8.2 g/dL — ABNORMAL LOW (ref 13.0–17.0)
MCH: 29.3 pg (ref 26.0–34.0)
MCHC: 33.1 g/dL (ref 30.0–36.0)
MCV: 88.6 fL (ref 78.0–100.0)
Platelets: 112 10*3/uL — ABNORMAL LOW (ref 150–400)
RBC: 2.8 MIL/uL — ABNORMAL LOW (ref 4.22–5.81)
RDW: 18.1 % — ABNORMAL HIGH (ref 11.5–15.5)
WBC: 10.3 10*3/uL (ref 4.0–10.5)

## 2017-06-10 MED ORDER — CLOPIDOGREL BISULFATE 75 MG PO TABS
75.0000 mg | ORAL_TABLET | Freq: Every day | ORAL | Status: DC
  Administered 2017-06-11 – 2017-06-12 (×2): 75 mg via ORAL
  Filled 2017-06-10 (×2): qty 1

## 2017-06-10 MED ORDER — ZOLPIDEM TARTRATE 10 MG PO TABS: 10.0000 mg | ORAL_TABLET | Freq: Every evening | ORAL | Status: DC | PRN

## 2017-06-10 MED ORDER — MAGNESIUM HYDROXIDE 400 MG/5ML PO SUSP: 30.0000 mL | Freq: Every day | ORAL | Status: DC | PRN

## 2017-06-10 MED ORDER — ALBUTEROL SULFATE (2.5 MG/3ML) 0.083% IN NEBU
2.5000 mg | INHALATION_SOLUTION | Freq: Four times a day (QID) | RESPIRATORY_TRACT | Status: DC | PRN
  Administered 2017-06-11: 2.5 mg via RESPIRATORY_TRACT
  Filled 2017-06-10: qty 3

## 2017-06-10 MED ORDER — HEPARIN SODIUM (PORCINE) 1000 UNIT/ML DIALYSIS: 1000.0000 [IU] | INTRAMUSCULAR | Status: DC | PRN

## 2017-06-10 MED ORDER — SENNA 8.6 MG PO TABS
1.0000 | ORAL_TABLET | Freq: Every day | ORAL | Status: DC
  Administered 2017-06-10 – 2017-06-11 (×2): 8.6 mg via ORAL
  Filled 2017-06-10 (×2): qty 1

## 2017-06-10 MED ORDER — CALCITRIOL 0.5 MCG PO CAPS
ORAL_CAPSULE | ORAL | Status: AC
  Filled 2017-06-10: qty 1

## 2017-06-10 MED ORDER — MINOXIDIL 10 MG PO TABS
10.0000 mg | ORAL_TABLET | Freq: Two times a day (BID) | ORAL | Status: DC | PRN
  Filled 2017-06-10: qty 1

## 2017-06-10 MED ORDER — ALTEPLASE 2 MG IJ SOLR: 2.0000 mg | Freq: Once | INTRAMUSCULAR | Status: DC | PRN

## 2017-06-10 MED ORDER — ALUM & MAG HYDROXIDE-SIMETH 200-200-20 MG/5ML PO SUSP: 30.0000 mL | ORAL | Status: DC | PRN

## 2017-06-10 MED ORDER — NICOTINE 21 MG/24HR TD PT24
21.0000 mg | MEDICATED_PATCH | Freq: Every day | TRANSDERMAL | Status: DC
  Administered 2017-06-10 – 2017-06-12 (×3): 21 mg via TRANSDERMAL
  Filled 2017-06-10 (×3): qty 1

## 2017-06-10 MED ORDER — LIDOCAINE-PRILOCAINE 2.5-2.5 % EX CREA: 1.0000 "application " | TOPICAL_CREAM | CUTANEOUS | Status: DC | PRN

## 2017-06-10 MED ORDER — SENNA 8.6 MG PO TABS: 1.0000 | ORAL_TABLET | Freq: Every day | ORAL | 0 refills | Status: DC | PRN

## 2017-06-10 MED ORDER — NICOTINE 14 MG/24HR TD PT24
14.0000 mg | MEDICATED_PATCH | Freq: Every day | TRANSDERMAL | Status: DC
  Filled 2017-06-10: qty 1

## 2017-06-10 MED ORDER — SEVELAMER CARBONATE 800 MG PO TABS
3200.0000 mg | ORAL_TABLET | Freq: Three times a day (TID) | ORAL | Status: DC
Start: 2017-06-10 — End: 2017-09-08

## 2017-06-10 MED ORDER — SEVELAMER CARBONATE 800 MG PO TABS
3200.0000 mg | ORAL_TABLET | Freq: Three times a day (TID) | ORAL | Status: DC
  Administered 2017-06-10 – 2017-06-12 (×6): 3200 mg via ORAL
  Filled 2017-06-10 (×7): qty 4

## 2017-06-10 MED ORDER — SODIUM CHLORIDE 0.9 % IV SOLN: 100.0000 mL | INTRAVENOUS | Status: DC | PRN

## 2017-06-10 MED ORDER — PENTAFLUOROPROP-TETRAFLUOROETH EX AERO: 1.0000 "application " | INHALATION_SPRAY | CUTANEOUS | Status: DC | PRN

## 2017-06-10 MED ORDER — ONDANSETRON HCL 4 MG PO TABS
4.0000 mg | ORAL_TABLET | Freq: Three times a day (TID) | ORAL | Status: DC | PRN
  Filled 2017-06-10: qty 1

## 2017-06-10 MED ORDER — LIDOCAINE HCL (PF) 1 % IJ SOLN: 5.0000 mL | INTRAMUSCULAR | Status: DC | PRN

## 2017-06-10 MED ORDER — OXYCODONE-ACETAMINOPHEN 5-325 MG PO TABS
1.0000 | ORAL_TABLET | Freq: Two times a day (BID) | ORAL | Status: DC | PRN
  Administered 2017-06-10 – 2017-06-12 (×4): 1 via ORAL
  Filled 2017-06-10 (×4): qty 1

## 2017-06-10 MED ORDER — ACETAMINOPHEN 325 MG PO TABS
650.0000 mg | ORAL_TABLET | Freq: Four times a day (QID) | ORAL | Status: DC | PRN
  Administered 2017-06-11 (×2): 650 mg via ORAL
  Filled 2017-06-10 (×2): qty 2

## 2017-06-10 MED ORDER — CALCITRIOL 0.25 MCG PO CAPS
ORAL_CAPSULE | ORAL | Status: AC
  Filled 2017-06-10: qty 1

## 2017-06-10 MED ORDER — ZOLPIDEM TARTRATE 5 MG PO TABS
10.0000 mg | ORAL_TABLET | Freq: Every day | ORAL | Status: DC
  Administered 2017-06-10 – 2017-06-11 (×2): 10 mg via ORAL
  Filled 2017-06-10 (×2): qty 2

## 2017-06-10 MED ORDER — GABAPENTIN 100 MG PO CAPS
100.0000 mg | ORAL_CAPSULE | Freq: Two times a day (BID) | ORAL | Status: DC
  Administered 2017-06-11 – 2017-06-12 (×3): 100 mg via ORAL
  Filled 2017-06-10 (×3): qty 1

## 2017-06-10 MED ORDER — DARBEPOETIN ALFA 150 MCG/0.3ML IJ SOSY
PREFILLED_SYRINGE | INTRAMUSCULAR | Status: AC
  Filled 2017-06-10: qty 0.3

## 2017-06-10 MED ORDER — ACETAMINOPHEN 325 MG PO TABS
ORAL_TABLET | ORAL | Status: AC
  Filled 2017-06-10: qty 2

## 2017-06-10 NOTE — Progress Notes (Addendum)
CSW called Sheriff's department at [phone] and arranged transportation for patient to [facility]. Patient is IVC'd and IVC paperwork is on chart.  Patient will discharge to [facility] Anticipated discharge date: 06/10/17 Transportation by: Sheriff's department  Nurse to call report to [phone].   CSW signing off.  Estanislado Emms, [place]  Clinical Social Worker

## 2017-06-10 NOTE — Discharge Summary (Signed)
Physician Discharge Summary  [name] OIT:[id number] DOB: Jan 23, 1961 DOA: 05/29/2017  PCP: [name], MD  Admit date: 05/29/2017 Discharge date: 06/10/2017  Admitted From: Home Disposition: Inpatient  Recommendations for Outpatient Follow-up:  1. Follow up with vascular surgery in 2 weeks 2. Please obtain BMP/CBC in one week, sooner if has recurrent bleeding 3. Please follow up on the following pending results: None  Home Health: None Equipment/Devices: None  Discharge Condition: Guarded CODE STATUS: Full code Diet recommendation: Renal diet, fluid restriction.   Brief/Interim Summary:  Admission HPI written by Jani Gravel, MD    HPI:   [name]  is a 56 y.o. male, w hypertension, coccaine abuse, Hep B/C, Copd, Cardiomyopathy (EF 40-45%),  ESRD on HD (M, W, F) , just had dialysis today and c/o dyspnea.  Pt took steroid at home and didn't feel better and called EMS.  Pt had slight cough but this was nonproductive.  Also noted that all of a sudden his left upper extremity was swollen.   Recent L brachiocephalic AVF shuntogram  (05/26/2017)=> p[atent fistula with >98mm small pseudoaneurysm proximally and moderate bilobe pseudoaneurysm in the mid segment and moderate sized broad pseudoaneurysm distally.  Pt noted to have bp 194/91 .  Pt presented to ED due to dyspnea and left upper extremity swelling.   In ED, pt was started on duoneb and bipap for dyspnea    Hospital course:  Acute pulmonary edema Secondary to fluid overload. Resolved with dialysis.  LUE AVF hematoma S/p evacuation of hematoma on 11/21, exploration of left upper arm on 11/23 with ligation of his fistula and placement of VAC and exploration of left arm on 11/25 with ligation of brachiocephalic fistula. Required 11 units of PRBC. Stable now. Vascular surgery recommendations:   Left arm wound care hydrogel to wound base, saline wet guaze, Xeroform non stick guaze to surrounding superficial  skin wounds and cover with dry guaze Kerlex and ace mild compression. Wet guaze with saline prior to removing dressing for daily changes. We do not want to cause bleeding.  Outpatient wound care  Follow up in 2 weeks  Acute blood loss anemia Secondary to hematoma. Required 11 units of PRBC. Hemoglobin stable.  Anemia of chronic disease Secondary to ESRD. Arenesp per nephrology  Acute on chronic thrombocytopenia Improved.  Elevated troponin No chest pain.  Atrial fibrillation/flutter S/p DCCV. Not on anticoagulation secondary to non-compliance.  ESRD on HD Patient underwent dialysis.  Hepatitis B and C Substance abuse  Thrombocytopenia Chronic. Likely secondary to liver disease. Trending up prior to discharge.  Suicidal ideation Patient threatened that he would kill hisself with gasoline and a match. Psychiatry recommended IVC and inpatient behavioral admission.  Hyponatremia Secondary to liver and kidney disease. Improved with dialysis.   Discharge Diagnoses:  Principal Problem:   MDD (major depressive disorder), single episode, moderate (HCC) Active Problems:   Chronic hepatitis C without hepatic coma (HCC)   Essential hypertension   COPD (chronic obstructive pulmonary disease) (HCC)   Hyperkalemia   Dyspnea   Tachycardia    Discharge Instructions   Allergies as of 06/10/2017      Reactions   Aspirin Other (See Comments)   STOMACH PAIN   Clonidine Derivatives Itching   Tramadol Itching      Medication List    STOP taking these medications   aspirin EC 81 MG tablet   predniSONE 20 MG tablet Commonly known as:  DELTASONE     TAKE these medications   acetaminophen 500  MG tablet Commonly known as:  TYLENOL Take 500-1,000 mg every 6 (six) hours as needed by mouth for headache (pain).   AEROCHAMBER MV inhaler Use as instructed   albuterol 108 (90 Base) MCG/ACT inhaler Commonly known as:  PROVENTIL HFA;VENTOLIN HFA Inhale 2 puffs  into the lungs every 6 (six) hours as needed for wheezing or shortness of breath.   calcitRIOL 0.25 MCG capsule Commonly known as:  ROCALTROL Take 3 capsules (0.75 mcg total) by mouth every Monday, Wednesday, and Friday with hemodialysis.   clopidogrel 75 MG tablet Commonly known as:  PLAVIX Take one tablet (75mg ) daily.   gabapentin 100 MG capsule Commonly known as:  NEURONTIN Take 100 mg 2 (two) times daily by mouth.   minoxidil 10 MG tablet Commonly known as:  LONITEN Take 10 mg 2 (two) times daily as needed by mouth (when blood pressure cuff reads "above normal").   nicotine 14 mg/24hr patch Commonly known as:  NICODERM CQ - dosed in mg/24 hours Place 14 mg daily onto the skin.   ondansetron 4 MG tablet Commonly known as:  ZOFRAN Take 4 mg every 8 (eight) hours as needed by mouth for nausea or vomiting.   oxycodone 5 MG capsule Commonly known as:  OXY-IR Take 5 mg 2 (two) times daily as needed by mouth for pain.   senna 8.6 MG Tabs tablet Commonly known as:  SENOKOT Take 1 tablet (8.6 mg total) by mouth daily as needed for mild constipation.   sevelamer carbonate 800 MG tablet Commonly known as:  RENVELA Take 4 tablets (3,200 mg total) by mouth 3 (three) times daily with meals.   zolpidem 10 MG tablet Commonly known as:  AMBIEN Take 1 tablet (10 mg total) by mouth at bedtime as needed for sleep.      Follow-up Information    Elam Dutch, MD Follow up in 2 week(s).   Specialties:  Vascular Surgery, Cardiology Why:  office will call for appointment Contact information: [address] [place] [address] [phone]          Allergies  Allergen Reactions  . Aspirin Other (See Comments)    STOMACH PAIN  . Clonidine Derivatives Itching  . Tramadol Itching    Consultations:  Vascular surgery   Procedures/Studies: Dg Chest 2 View  Result Date: 05/22/2017 CLINICAL DATA:  Shortness of breath and hypertension EXAM: CHEST  2 VIEW COMPARISON:   May 02, 2017 FINDINGS: There is mild elevation of the left hemidiaphragm, stable. There is mild left base atelectasis. Lungs elsewhere clear. There is cardiomegaly with pulmonary venous hypertension. There is aortic atherosclerosis. No evident adenopathy. No bone lesions. IMPRESSION: Left base atelectasis. No edema or consolidation. Stable cardiomegaly. There is aortic atherosclerosis. Aortic Atherosclerosis (ICD10-I70.0). Electronically Signed   By: Lowella Grip III M.D.   On: 05/22/2017 13:04   Dg Chest Port 1 View  Result Date: 05/30/2017 CLINICAL DATA:  Central line placement EXAM: PORTABLE CHEST 1 VIEW COMPARISON:  Portable exam 1813 hours compared to 05/29/2017 FINDINGS: Large bore dual-lumen RIGHT jugular central venous catheter with tip projecting over RIGHT atrium. Enlargement of cardiac silhouette with pulmonary vascular congestion. Atherosclerotic calcification aorta. Minimal perihilar infiltrates likely pulmonary edema. No gross pleural effusion or pneumothorax. Osseous structures unremarkable. IMPRESSION: No pneumothorax following RIGHT jugular line placement. Enlargement of cardiac silhouette with minimal pulmonary edema. Electronically Signed   By: Lavonia Dana M.D.   On: 05/30/2017 18:50   Dg Chest Portable 1 View  Result Date: 05/29/2017 CLINICAL DATA:  Shortness of Breath EXAM: PORTABLE CHEST 1 VIEW COMPARISON:  05/22/2017 FINDINGS: Cardiac shadow remains enlarged. Aortic calcifications are again seen. The lungs are well aerated bilaterally with mild interstitial changes consistent with early edema. No focal infiltrate or sizable effusion is noted. IMPRESSION: Mild interstitial edema. Electronically Signed   By: Inez Catalina M.D.   On: 05/29/2017 20:29   Dg Fluoro Guide Cv Line-no Report  Result Date: 05/30/2017 Fluoroscopy was utilized by the requesting physician.  No radiographic interpretation.     Subjective: No concerns today.  Discharge Exam: Vitals:   06/10/17  1159 06/10/17 1217  BP: 132/69 135/69  Pulse: 98 98  Resp:  12  Temp:  98.5 F (36.9 C)  SpO2:  98%   Vitals:   06/10/17 1100 06/10/17 1130 06/10/17 1159 06/10/17 1217  BP: (!) 159/87 126/63 132/69 135/69  Pulse: (!) 107 (!) 104 98 98  Resp: 14 13  12   Temp:    98.5 F (36.9 C)  TempSrc:    Axillary  SpO2:    98%  Weight:    76.1 kg (167 lb 12.3 oz)  Height:        General: Pt is alert, awake, not in acute distress Cardiovascular: RRR, S1/S2 +, no rubs, no gallops Respiratory: CTA bilaterally, no wheezing, no rhonchi Abdominal: Soft, NT, ND, bowel sounds + Extremities: pitting edema in bilateral feet, no cyanosis    The results of significant diagnostics from this hospitalization (including imaging, microbiology, ancillary and laboratory) are listed below for reference.     Microbiology: Recent Results (from the past 240 hour(s))  Surgical PCR screen     Status: None   Collection Time: 06/03/17  8:39 AM  Result Value Ref Range Status   MRSA, PCR NEGATIVE NEGATIVE Final   Staphylococcus aureus NEGATIVE NEGATIVE Final    Comment: (NOTE) The Xpert SA Assay (FDA approved for NASAL specimens in patients 76 years of age and older), is one component of a comprehensive surveillance program. It is not intended to diagnose infection nor to guide or monitor treatment.      Labs: BNP (last 3 results) Recent Labs    08/29/16 2259 02/20/17 1706 03/24/17 2050  BNP >4,500.0* 3,193.8* 8,841.6*   Basic Metabolic Panel: Recent Labs  Lab 06/07/17 0214 06/08/17 0651 06/09/17 0636 06/10/17 0651 06/10/17 1248  NA 128* 128* 129* 124* 131*  K 5.3* 5.8* 4.9 5.6* 4.2  CL 92* 92* 92* 88* 93*  CO2 24 25 26 24 29   GLUCOSE 97 111* 111* 88 119*  BUN 31* 46* 32* 43* 17  CREATININE 5.79* 7.64* 6.32* 7.99* 4.20*  CALCIUM 9.3 9.1 7.8* 8.0* 8.2*  PHOS  --  5.0*  --   --   --    Liver Function Tests: Recent Labs  Lab 06/04/17 1518 06/08/17 0651  AST 38  --   ALT 21  --    ALKPHOS 96  --   BILITOT 1.0  --   PROT 5.2*  --   ALBUMIN 2.9* 3.1*   CBC: Recent Labs  Lab 06/03/17 1911 06/04/17 0332 06/04/17 2306  06/06/17 0552 06/07/17 0214 06/08/17 0650 06/09/17 0636 06/10/17 0651  WBC 14.3* 11.4* 11.2*   < > 11.1* 11.6* 12.0* 10.3 10.3  NEUTROABS 12.7* 8.8* 7.7  --   --   --   --   --   --   HGB 6.4* 6.7* 6.7*   < > 8.2* 8.7* 8.4* 8.4* 8.2*  HCT 18.4* 18.8* 19.3*   < >  23.9* 25.6* 25.2* 25.4* 24.8*  MCV 84.8 85.5 86.5   < > 88.2 89.8 89.0 90.1 88.6  PLT 125* 62* 71*   < > 79* 69* 93* 110* 112*   < > = values in this interval not displayed.   Urinalysis    Component Value Date/Time   COLORURINE YELLOW 07/16/2011 1619   APPEARANCEUR CLEAR 07/16/2011 1619   LABSPEC 1.018 07/16/2011 1619   PHURINE 7.5 07/16/2011 1619   GLUCOSEU 250 (A) 07/16/2011 1619   HGBUR MODERATE (A) 07/16/2011 1619   BILIRUBINUR SMALL (A) 07/16/2011 1619   KETONESUR NEGATIVE 07/16/2011 1619   PROTEINUR >300 (A) 07/16/2011 1619   UROBILINOGEN 1.0 07/16/2011 1619   NITRITE NEGATIVE 07/16/2011 1619   LEUKOCYTESUR SMALL (A) 07/16/2011 1619   Microbiology Recent Results (from the past 240 hour(s))  Surgical PCR screen     Status: None   Collection Time: 06/03/17  8:39 AM  Result Value Ref Range Status   MRSA, PCR NEGATIVE NEGATIVE Final   Staphylococcus aureus NEGATIVE NEGATIVE Final    Comment: (NOTE) The Xpert SA Assay (FDA approved for NASAL specimens in patients 57 years of age and older), is one component of a comprehensive surveillance program. It is not intended to diagnose infection nor to guide or monitor treatment.      Time coordinating discharge: Over 30 minutes  SIGNED:   Cordelia Poche, MD [facility] 06/10/2017, 1:35 PM Pager [phone]  If 7PM-7AM, please contact night-coverage [email or web address] Password [email or web address]

## 2017-06-10 NOTE — Progress Notes (Signed)
CSW confirmed IVC served yesterday evening by officer Defazio (badge [phone]) and sent IVC paperwork to [facility]. CSW to support with discharge to [facility] when patient completes HD.  Estanislado Emms, [place]

## 2017-06-10 NOTE — Progress Notes (Signed)
Called full report to nurse at [facility]. Pt is IVC. Sheriff here to take pt to [place]. Sheriff has all of pt's belongings. Charge Nurse Ronalee Belts in to see pt. Refused to have his left arm dressing change. Pola Corn, RN

## 2017-06-10 NOTE — BHH Group Notes (Signed)
[place] Group Notes:  (Nursing/MHT/Case Management/Adjunct)  Date:  06/10/2017  Time:  9:44 PM  Type of Therapy:  Evening Wrap-up Group  Participation Level:  Did Not Attend   Summary of Progress/Problems:  [name] 06/10/2017, 9:44 PM

## 2017-06-10 NOTE — BHH Suicide Risk Assessment (Signed)
[name] Admission Suicide Risk Assessment   Nursing information obtained from:    Demographic factors:    Current Mental Status:    Loss Factors:    Historical Factors:    Risk Reduction Factors:     Total Time spent with patient: 1 hour Principal Problem: Major depressive disorder, single episode, severe without psychosis ([name]) Diagnosis:   Patient Active Problem List   Diagnosis Date Noted  . Major depressive disorder, single episode, severe without psychosis ([name]) [F32.2] 06/10/2017  . Suicidal ideation [R45.851] 06/10/2017  . Arm wound, left, sequela [S41.102S] 06/10/2017  . Dyspnea [R06.00] 05/29/2017  . Tachycardia [R00.0] 05/29/2017  . Hyperkalemia [E87.5] 05/22/2017  . Acute metabolic encephalopathy [F81.01]   . Anemia [D64.9] 04/23/2017  . Ascites [R18.8] 04/23/2017  . COPD (chronic obstructive pulmonary disease) ([place]) [J44.9] 04/23/2017  . Acute on chronic respiratory failure with hypoxia ([name]) [J96.21] 03/25/2017  . Atrial flutter ([name]) [I48.92] 03/25/2017  . Arrhythmia [I49.9] 03/25/2017  . COPD GOLD II/ still smoking [J44.9] 09/27/2016  . Essential hypertension [I10] 09/27/2016  . Fluid overload [E87.70] 08/30/2016  . COPD exacerbation ([name]) [J44.1] 08/17/2016  . Hypertensive urgency [I16.0] 08/17/2016  . Respiratory failure ([name]) [J96.90] 08/17/2016  . Problem with dialysis access [name]) [T82.898A] 07/23/2016  . End stage renal disease ([name]) [N18.6] 12/02/2015  . Chronic hepatitis B ([name]) [B18.1] 03/05/2014  . Chronic hepatitis C without hepatic coma ([name]) [B18.2] 03/05/2014  . Internal hemorrhoids with bleeding, swelling and itching [K64.8] 03/05/2014  . Thrombocytopenia ([name]) [D69.6] 03/05/2014  . Chest pain [R07.9] 02/27/2014  . Alcohol abuse [F10.10] 04/14/2009  . Tobacco use disorder [F17.200] 04/14/2009  . GANGLION CYST [M67.40] 04/14/2009   Subjective Data: suicidal ideation  Continued Clinical Symptoms:    The "Alcohol Use Disorders Identification  Test", Guidelines for Use in Primary Care, Second Edition.  World Pharmacologist [name]). Score between 0-7:  no or low risk or alcohol related problems. Score between 8-15:  moderate risk of alcohol related problems. Score between 16-19:  high risk of alcohol related problems. Score 20 or above:  warrants further diagnostic evaluation for alcohol dependence and treatment.   CLINICAL FACTORS:   Depression:   Impulsivity Insomnia Chronic Pain Medical Diagnoses and Treatments/Surgeries   Musculoskeletal: Strength & Muscle Tone: within normal limits Gait & Station: normal Patient leans: N/A  Psychiatric Specialty Exam: Physical Exam  Nursing note and vitals reviewed. Psychiatric: His speech is normal and behavior is normal. Thought content normal. His affect is angry. Cognition and memory are normal. He expresses impulsivity.    Review of Systems  Respiratory: Positive for shortness of breath.   Skin:       Dressed wound of the L arm  Neurological: Negative.   Psychiatric/Behavioral: The patient has insomnia.   All other systems reviewed and are negative.   There were no vitals taken for this visit.There is no height or weight on file to calculate BMI.  General Appearance: Casual  Eye Contact:  Good  Speech:  Clear and Coherent  Volume:  Normal  Mood:  Angry  Affect:  Congruent  Thought Process:  Goal Directed and Descriptions of Associations: Intact  Orientation:  Full (Time, Place, and Person)  Thought Content:  WDL  Suicidal Thoughts:  No  Homicidal Thoughts:  No  Memory:  Immediate;   Fair Recent;   Fair Remote;   Fair  Judgement:  Poor  Insight:  Lacking  Psychomotor Activity:  Normal  Concentration:  Concentration: Fair and Attention Span: Fair  Recall:  Smiley Houseman of Knowledge:  Fair  Language:  Fair  Akathisia:  No  Handed:  Right  AIMS (if indicated):     Assets:  Communication Skills Desire for Improvement Financial  Resources/Insurance Housing Resilience  ADL's:  Intact  Cognition:  WNL  Sleep:         COGNITIVE FEATURES THAT CONTRIBUTE TO RISK:  None    SUICIDE RISK:   Minimal: No identifiable suicidal ideation.  Patients presenting with no risk factors but with morbid ruminations; may be classified as minimal risk based on the severity of the depressive symptoms  PLAN OF CARE: hospital admission, medication management, dialysis, discharge planning.  Mr. [name] is a 56 year old male with no psychiatric history who threatened suicide to get his children's attention.  #Suicidal ideation -patient adamantly denies any thoughts, intention od plans to hurt himself or others -able to contract for safety in the hospital  #Mood -denies any symptoms of depression and declines treatment -Prolonged QTc excludes all antidepressants  #ESRD -Dr. Juleen China, nephrology will see the patient in the morning -continue Renvela 3,200 mg TID  #HTN -continue Monoxidil 10 mg BID PRN "above normal blood pressure"  #Pain -continue Neurontin 100 mg BID -continue Percocet 5 mg BID PRN pain  #L arm wound -wound consult S/p evacuation of hematoma on 11/21, exploration of left upper arm on 11/23 with ligation of his fistula and placement of VAC and exploration of left arm on 11/25 with ligation of brachiocephalic fistula. Required 11 units of PRBC. Stable now. -vascular surgery recommendations:   Left arm wound care hydrogel to wound base, saline wet guaze, Xeroform non stick guaze to surrounding superficial skin wounds and cover with dry guaze Kerlex and ace mild compression. Wet guaze with saline prior to removing dressing for daily changes. We do not want to cause bleeding.  Outpatient wound care  Follow up in 2 weeks  #Atrial flutter -continue Plavix  #COPD -Albuterol inhaler  #Insomnia -Ambien 10 mg nightly  #Constipation -Senna nightly  #Smoking cessation -Nicotine patch  available  #Disposition -discharge to home with home wound care -follow up with regular providers    I certify that inpatient services furnished can reasonably be expected to improve the patient's condition.   Orson Slick, MD 06/10/2017, 6:02 PM

## 2017-06-10 NOTE — Procedures (Signed)
UF 6 L planned for today on HD.  Pt w/o complaints.  No issues on HD today.   I was present at this dialysis session, have reviewed the session itself and made  appropriate changes [name] MD [place] pager [phone]   06/10/2017, 1:20 PM

## 2017-06-10 NOTE — Tx Team (Signed)
Initial Treatment Plan 06/10/2017 6:28 PM [name] XEX:[id number]    PATIENT STRESSORS: Health problems Marital or family conflict   PATIENT STRENGTHS: Active sense of humor Communication skills Supportive family/friends   PATIENT IDENTIFIED PROBLEMS: Family conflict  AV fistula                   DISCHARGE CRITERIA:  Ability to meet basic life and health needs Improved stabilization in mood, thinking, and/or behavior  PRELIMINARY DISCHARGE PLAN: Return to previous work or school arrangements  PATIENT/FAMILY INVOLVEMENT: This treatment plan has been presented to and reviewed with the patient, [name], and/or family member.  The patient and family have been given the opportunity to ask questions and make suggestions.  Geraldo Docker, RN 06/10/2017, 6:28 PM

## 2017-06-10 NOTE — Progress Notes (Signed)
D: Patient denies SI/HI/AVH. Patient presents as argumentative, irritable, and aggravated during assessment. Patient mood became more pleasant after orientation and education of unit.  Patient has noted +3 pitting edema to left arm and hand. Patient left arm is raised above chest level using pillow as support. Patient reported acute pain of 10/10 to left arm, PRN medication intervention applied. Patient reported extreme pressure in left arm at wound site of split AV fistula. Patient wound is currently covered with dry dressing and ACE wrap, scant blood noted on bandage near patients left antecubital.  Patient has generalized weakness and poor gait. Patient is unsteady when standing.   A: Patient was assessed by this nurse. Patient encouraged to report any thoughts of SI/HI/AVH to staff.  Patient received scheduled medications. Q x 15 minute observation checks were completed for safety, patient is on high fall risk precations. Patient room is near nursing station to increase observation. Patient was provided with verbal education on provided medications. Patient care plan was reviewed. Patient was offered support and encouragement. Patient was encourage to attend groups, participate in unit activities and continue with plan of care.   R: Patient adheres with scheduled medication. Patient responded well to PRN medications and symptoms were relieved. Patient is receptive to treatment and safety maintained on unit. Patient had an estimated 4 hours 30 minutes of restful sleep.

## 2017-06-10 NOTE — Care Management Note (Signed)
Case Management Note Marvetta Gibbons RN, BSN Unit 4E-Case Manager [phone]  Patient Details  Name: [name] MRN: [id number] Date of Birth: [birth year]  Subjective/Objective:   Pt admitted with SOB- fistula pseudoaneurysm- s/p Diatek cath placement on 11/19-                 Action/Plan: PTA pt lived at home- hx cocaine abuse- PCP- Osei-Bonsu- CM to follow for d/c needs  Expected Discharge Date:  06/10/17               Expected Discharge Plan:  [facility]  In-House Referral:  Clinical Social Work  Discharge planning Services  CM Consult  Post Acute Care Choice:  Home Health Choice offered to:     DME Arranged:    DME Agency:     HH Arranged:    [place] Agency:  [facility]  Status of Service:  Completed, signed off  If discussed at [place] of Stay Meetings, dates discussed:    Discharge Disposition: psych inpt hospital    Additional Comments:  06-10-17- 1415- Marvetta Gibbons RN, CM- pt for d/c - per psych recommendations pt has been IVC'd and will go to inpt psych at [facility]. - per [name] with [facility] pt has referral for any HH needs- they will follow post discharge from [place].   [name], RN 06/10/2017, 2:18 PM

## 2017-06-10 NOTE — H&P (Signed)
Psychiatric Admission Assessment Adult  Patient Identification: [name] MRN:  [id number] Date of Evaluation:  06/10/2017 Chief Complaint:  depression Principal Diagnosis: Adjustment disorder with mixed anxiety and depressed mood Diagnosis:   Patient Active Problem List   Diagnosis Date Noted  . Adjustment disorder with mixed anxiety and depressed mood [F43.23] 06/10/2017  . Suicidal ideation [R45.851] 06/10/2017  . Arm wound, left, sequela [S41.102S] 06/10/2017  . Dyspnea [R06.00] 05/29/2017  . Tachycardia [R00.0] 05/29/2017  . Hyperkalemia [E87.5] 05/22/2017  . Acute metabolic encephalopathy [J88.41]   . Anemia [D64.9] 04/23/2017  . Ascites [R18.8] 04/23/2017  . COPD (chronic obstructive pulmonary disease) ([place]) [J44.9] 04/23/2017  . Acute on chronic respiratory failure with hypoxia ([place]) [J96.21] 03/25/2017  . Atrial flutter ([place]) [I48.92] 03/25/2017  . Arrhythmia [I49.9] 03/25/2017  . COPD GOLD II/ still smoking [J44.9] 09/27/2016  . Essential hypertension [I10] 09/27/2016  . Fluid overload [E87.70] 08/30/2016  . COPD exacerbation ([place]) [J44.1] 08/17/2016  . Hypertensive urgency [I16.0] 08/17/2016  . Respiratory failure ([place]) [J96.90] 08/17/2016  . Problem with dialysis access [facility]) [T82.898A] 07/23/2016  . End stage renal disease ([place]) [N18.6] 12/02/2015  . Chronic hepatitis B ([place]) [B18.1] 03/05/2014  . Chronic hepatitis C without hepatic coma ([place]) [B18.2] 03/05/2014  . Internal hemorrhoids with bleeding, swelling and itching [K64.8] 03/05/2014  . Thrombocytopenia ([place]) [D69.6] 03/05/2014  . Chest pain [R07.9] 02/27/2014  . Alcohol abuse [F10.10] 04/14/2009  . Tobacco use disorder [F17.200] 04/14/2009  . GANGLION CYST [M67.40] 04/14/2009   History of Present Illness:   Identifying data. Mr. Hackman is a 56 year old male with end stage renal disease.  Chief complaint. "I don't need to be here."  History of present illness. Information was obtained from the  patient and the chart. The patient was admitted to [place] for evacuation of hematoma. During his hospitalization, he reported that he intends to kill himself creating gas explosion. He was seen by a psychiatrist there but was not cooperative during the interview making it impossible to assess suicidality. He was transferred to [facility] for treatment. The patient adamantly denies any symptoms of depression, anxiety or psychosis. He denies any thoughts, intention or plans to hurt himself or other. He is quite frank that he threatened suicide to get his children attention. They have not been talking to each other for 7 years. "They are talking about me now." The patient explains that this was the only way he could accomplish it. He does not use a;cohol or drugs.  Past psychiatric history. None.  Family history. None.  Social history. He lives independently. He receives dialysis in [place], [place], Fri. He uses Medicaid ride.   Total Time spent with patient: 1 hour  Is the patient at risk to self? No.  Has the patient been a risk to self in the past 6 months? No.  Has the patient been a risk to self within the distant past? No.  Is the patient a risk to others? No.  Has the patient been a risk to others in the past 6 months? No.  Has the patient been a risk to others within the distant past? No.   Prior Inpatient Therapy:   Prior Outpatient Therapy:    Alcohol Screening: Patient refused Alcohol Screening Tool: Yes 1. How often do you have a drink containing alcohol?: Never 2. How many drinks containing alcohol do you have on a typical day when you are drinking?: 1 or 2 3. How often do you have six or more  drinks on one occasion?: Never AUDIT-C Score: 0 4. How often during the last year have you found that you were not able to stop drinking once you had started?: Never 5. How often during the last year have you failed to do what was normally expected from you becasue of drinking?:  Never 6. How often during the last year have you needed a first drink in the morning to get yourself going after a heavy drinking session?: Never 7. How often during the last year have you had a feeling of guilt of remorse after drinking?: Never 8. How often during the last year have you been unable to remember what happened the night before because you had been drinking?: Never 9. Have you or someone else been injured as a result of your drinking?: No 10. Has a relative or friend or a doctor or another health worker been concerned about your drinking or suggested you cut down?: No Alcohol Use Disorder Identification Test Final Score (AUDIT): 0 Intervention/Follow-up: Patient Refused, AUDIT Score <7 follow-up not indicated Substance Abuse History in the last 12 months:  No. Consequences of Substance Abuse: NA Previous Psychotropic Medications: No  Psychological Evaluations: No  Past Medical History:  Past Medical History:  Diagnosis Date  . Adenomatous colon polyp    tubular  . Anemia   . Anxiety   . Arthritis    left shoulder  . Atherosclerosis of aorta ([place])   . Cardiomegaly   . Chest pain    DATE UNKNOWN, C/O PERIODICALLY  . Chronic kidney disease    dialysis - M/W/F  . Cocaine abuse ([place])   . COPD exacerbation ([place]) 08/17/2016  . Dialysis patient ([place])    Monday-Wednesday-Friday  . GERD (gastroesophageal reflux disease)    DATE UNKNOWN  . Hemorrhoids   . Hepatitis B, chronic ([place])   . Hepatitis C   . Hyperkalemia   . Hypertension   . Metabolic bone disease    Patient denies  . Nephrolithiasis   . Pneumonia   . Pulmonary edema   . Renal disorder   . Renal insufficiency   . Shortness of breath dyspnea    " for the last past year with this dialysis"  . Tubular adenoma of colon     Past Surgical History:  Procedure Laterality Date  . A/V FISTULAGRAM Left 05/26/2017   Procedure: A/V FISTULAGRAM;  Surgeon: Conrad [place], MD;  Location: [place] [facility];  Service:  Cardiovascular;  Laterality: Left;  . AV FISTULA PLACEMENT  2012   BELIEVED WAS PLACED IN JUNE  . COLONOSCOPY    . CORONARY STENT INTERVENTION N/A 02/22/2017   Procedure: CORONARY STENT INTERVENTION;  Surgeon: Nigel Mormon, MD;  Location: [place] [facility];  Service: Cardiovascular;  Laterality: N/A;  . HEMORRHOID BANDING    . I&D EXTREMITY Left 06/01/2017   Procedure: IRRIGATION AND DEBRIDEMENT LEFT ARM HEMATOMA WITH LIGATION OF LEFT ARM AV FISTULA;  Surgeon: Elam Dutch, MD;  Location: [place];  Service: Vascular;  Laterality: Left;  . INSERTION OF DIALYSIS CATHETER  05/30/2017  . INSERTION OF DIALYSIS CATHETER N/A 05/30/2017   Procedure: INSERTION OF DIALYSIS CATHETER;  Surgeon: Elam Dutch, MD;  Location: [facility];  Service: Vascular;  Laterality: N/A;  . LEFT HEART CATH AND CORONARY ANGIOGRAPHY N/A 02/22/2017   Procedure: LEFT HEART CATH AND CORONARY ANGIOGRAPHY;  Surgeon: Nigel Mormon, MD;  Location: [place] [facility];  Service: Cardiovascular;  Laterality: N/A;  . LIGATION OF  ARTERIOVENOUS  FISTULA Left 12/11/5636   Procedure: Plication of Left Arm Arteriovenous Fistula;  Surgeon: Elam Dutch, MD;  Location: [place];  Service: Vascular;  Laterality: Left;  . POLYPECTOMY    . REVISON OF ARTERIOVENOUS FISTULA Left 9/37/3428   Procedure: PLICATION OF DISTAL ANEURYSMAL SEGEMENT OF LEFT UPPER ARM ARTERIOVENOUS FISTULA;  Surgeon: Elam Dutch, MD;  Location: [facility];  Service: Vascular;  Laterality: Left;  . REVISON OF ARTERIOVENOUS FISTULA Left 7/68/1157   Procedure: Plication of Left Upper Arm Fistula ;  Surgeon: Waynetta Sandy, MD;  Location: [place];  Service: Vascular;  Laterality: Left;  . THROMBECTOMY W/ EMBOLECTOMY Left 06/05/2017   Procedure: EXPLORATION OF LEFT ARM FOR BLEEDING; OVERSEWED PROXIMAL FISTULA;  Surgeon: Angelia Mould, MD;  Location: [place];  Service: Vascular;  Laterality: Left;  . WOUND EXPLORATION Left 06/03/2017   Procedure:  WOUND EXPLORATION WITH WOUND VAC APPLICATION TO LEFT ARM;  Surgeon: Angelia Mould, MD;  Location[facility];  Service: Vascular;  Laterality: Left;   Family History:  Family History  Problem Relation Age of Onset  . Heart disease Mother   . Lung cancer Mother   . Heart disease Father   . Malignant hyperthermia Father   . COPD Father   . Throat cancer Sister   . Esophageal cancer Sister   . Hypertension Other   . COPD Other   . Colon cancer Neg Hx   . Colon polyps Neg Hx   . Rectal cancer Neg Hx   . Stomach cancer Neg Hx     Tobacco Screening: Have you used any form of tobacco in the last 30 days? (Cigarettes, Smokeless Tobacco, Cigars, and/or Pipes): Yes Tobacco use, Select all that apply: 4 or less cigarettes per day Are you interested in Tobacco Cessation Medications?: No, patient refused Counseled patient on smoking cessation including recognizing danger situations, developing coping skills and basic information about quitting provided: Refused/Declined practical counseling Social History:  Social History   Substance and Sexual Activity  Alcohol Use Yes  . Alcohol/week: 2.4 oz  . Types: 1 Glasses of wine, 1 Cans of beer, 1 Shots of liquor, 1 Standard drinks or equivalent per week     Social History   Substance and Sexual Activity  Drug Use Yes  . Types: Marijuana, Cocaine    Additional Social History:                           Allergies:   Allergies  Allergen Reactions  . Aspirin Other (See Comments)    STOMACH PAIN  . Clonidine Derivatives Itching  . Tramadol Itching   Lab Results:  Results for orders placed or performed during the hospital encounter of 05/29/17 (from the past 48 hour(s))  CBC     Status: Abnormal   Collection Time: 06/09/17  6:36 AM  Result Value Ref Range   WBC 10.3 4.0 - 10.5 K/uL   RBC 2.82 (L) 4.22 - 5.81 MIL/uL   Hemoglobin 8.4 (L) 13.0 - 17.0 g/dL    Comment: CONSISTENT WITH PREVIOUS RESULT   HCT 25.4 (L) 39.0 -  52.0 %   MCV 90.1 78.0 - 100.0 fL   MCH 29.8 26.0 - 34.0 pg   MCHC 33.1 30.0 - 36.0 g/dL   RDW 17.3 (H) 11.5 - 15.5 %   Platelets 110 (L) 150 - 400 K/uL    Comment: CONSISTENT WITH PREVIOUS RESULT  Basic metabolic panel  Status: Abnormal   Collection Time: 06/09/17  6:36 AM  Result Value Ref Range   Sodium 129 (L) 135 - 145 mmol/L   Potassium 4.9 3.5 - 5.1 mmol/L   Chloride 92 (L) 101 - 111 mmol/L   CO2 26 22 - 32 mmol/L   Glucose, Bld 111 (H) 65 - 99 mg/dL   BUN 32 (H) 6 - 20 mg/dL   Creatinine, Ser 6.32 (H) 0.61 - 1.24 mg/dL   Calcium 7.8 (L) 8.9 - 10.3 mg/dL   GFR calc non Af Amer 9 (L) >60 mL/min   GFR calc Af Amer 10 (L) >60 mL/min    Comment: (NOTE) The eGFR has been calculated using the CKD EPI equation. This calculation has not been validated in all clinical situations. eGFR's persistently <60 mL/min signify possible Chronic Kidney Disease.    Anion gap 11 5 - 15  CBC     Status: Abnormal   Collection Time: 06/10/17  6:51 AM  Result Value Ref Range   WBC 10.3 4.0 - 10.5 K/uL   RBC 2.80 (L) 4.22 - 5.81 MIL/uL   Hemoglobin 8.2 (L) 13.0 - 17.0 g/dL   HCT 24.8 (L) 39.0 - 52.0 %   MCV 88.6 78.0 - 100.0 fL   MCH 29.3 26.0 - 34.0 pg   MCHC 33.1 30.0 - 36.0 g/dL   RDW 18.1 (H) 11.5 - 15.5 %   Platelets 112 (L) 150 - 400 K/uL    Comment: REPEATED TO VERIFY CONSISTENT WITH PREVIOUS RESULT   Basic metabolic panel     Status: Abnormal   Collection Time: 06/10/17  6:51 AM  Result Value Ref Range   Sodium 124 (L) 135 - 145 mmol/L   Potassium 5.6 (H) 3.5 - 5.1 mmol/L   Chloride 88 (L) 101 - 111 mmol/L   CO2 24 22 - 32 mmol/L   Glucose, Bld 88 65 - 99 mg/dL   BUN 43 (H) 6 - 20 mg/dL   Creatinine, Ser 7.99 (H) 0.61 - 1.24 mg/dL   Calcium 8.0 (L) 8.9 - 10.3 mg/dL   GFR calc non Af Amer 7 (L) >60 mL/min   GFR calc Af Amer 8 (L) >60 mL/min    Comment: (NOTE) The eGFR has been calculated using the CKD EPI equation. This calculation has not been validated in all clinical  situations. eGFR's persistently <60 mL/min signify possible Chronic Kidney Disease.    Anion gap 12 5 - 15  Basic metabolic panel     Status: Abnormal   Collection Time: 06/10/17 12:48 PM  Result Value Ref Range   Sodium 131 (L) 135 - 145 mmol/L    Comment: DELTA CHECK NOTED   Potassium 4.2 3.5 - 5.1 mmol/L    Comment: DELTA CHECK NOTED   Chloride 93 (L) 101 - 111 mmol/L   CO2 29 22 - 32 mmol/L   Glucose, Bld 119 (H) 65 - 99 mg/dL   BUN 17 6 - 20 mg/dL   Creatinine, Ser 4.20 (H) 0.61 - 1.24 mg/dL    Comment: DELTA CHECK NOTED   Calcium 8.2 (L) 8.9 - 10.3 mg/dL   GFR calc non Af Amer 15 (L) >60 mL/min   GFR calc Af Amer 17 (L) >60 mL/min    Comment: (NOTE) The eGFR has been calculated using the CKD EPI equation. This calculation has not been validated in all clinical situations. eGFR's persistently <60 mL/min signify possible Chronic Kidney Disease.    Anion gap 9 5 - 15  Blood Alcohol level:  Lab Results  Component Value Date   ETH <5 12/13/2015   ETH <10 mg/dL 73/71/0626    Metabolic Disorder Labs:  Lab Results  Component Value Date   HGBA1C 4.6 (L) 04/24/2017   MPG 85.32 04/24/2017   MPG 94 08/17/2016   No results found for: PROLACTIN Lab Results  Component Value Date   CHOL 103 08/17/2016   TRIG 97 08/17/2016   HDL 34 (L) 08/17/2016   CHOLHDL 3.0 08/17/2016   VLDL 19 08/17/2016   LDLCALC 50 08/17/2016   LDLCALC  01/30/2010    80        Total Cholesterol/HDL:CHD Risk Coronary Heart Disease Risk Table                     Men   Women  1/2 Average Risk   3.4   3.3  Average Risk       5.0   4.4  2 X Average Risk   9.6   7.1  3 X Average Risk  23.4   11.0        Use the calculated Patient Ratio above and the CHD Risk Table to determine the patient's CHD Risk.        ATP III CLASSIFICATION (LDL):  <100     mg/dL   Optimal  100-129  mg/dL   Near or Above                    Optimal  130-159  mg/dL   Borderline  160-189  mg/dL   High  >190     mg/dL    Very High    Current Medications: Current Facility-Administered Medications  Medication Dose Route Frequency Provider Last Rate Last Dose  . acetaminophen (TYLENOL) tablet 650 mg  650 mg Oral Q6H PRN Marda Breidenbach B, MD      . albuterol (PROVENTIL) (2.5 MG/3ML) 0.083% nebulizer solution 2.5 mg  2.5 mg Inhalation Q6H PRN Cotina Freedman B, MD      . alum & mag hydroxide-simeth (MAALOX/MYLANTA) 200-200-20 MG/5ML suspension 30 mL  30 mL Oral Q4H PRN Antone Summons B, MD      . clopidogrel (PLAVIX) tablet 75 mg  75 mg Oral Daily Jerilee Space B, MD      . gabapentin (NEURONTIN) capsule 100 mg  100 mg Oral BID Davell Beckstead B, MD      . magnesium hydroxide (MILK OF MAGNESIA) suspension 30 mL  30 mL Oral Daily PRN Krystian Ferrentino B, MD      . minoxidil (LONITEN) tablet 10 mg  10 mg Oral BID PRN Maxamillion Banas B, MD      . nicotine (NICODERM CQ - dosed in mg/24 hours) patch 14 mg  14 mg Transdermal Daily Sully Dyment B, MD      . ondansetron (ZOFRAN) tablet 4 mg  4 mg Oral Q8H PRN Kery Batzel B, MD      . oxyCODONE-acetaminophen (PERCOCET/ROXICET) 5-325 MG per tablet 1 tablet  1 tablet Oral BID PRN Leeann Bady B, MD      . senna (SENOKOT) tablet 8.6 mg  1 tablet Oral QHS Miyoshi Ligas B, MD      . sevelamer carbonate (RENVELA) tablet 3,200 mg  3,200 mg Oral TID WC Quandre Polinski B, MD      . zolpidem (AMBIEN) tablet 10 mg  10 mg Oral QHS Kilie Rund B, MD       PTA Medications: Medications Prior  to Admission  Medication Sig Dispense Refill Last Dose  . acetaminophen (TYLENOL) 500 MG tablet Take 500-1,000 mg every 6 (six) hours as needed by mouth for headache (pain).    few days ago  . albuterol (PROVENTIL HFA;VENTOLIN HFA) 108 (90 Base) MCG/ACT inhaler Inhale 2 puffs into the lungs every 6 (six) hours as needed for wheezing or shortness of breath. 1 Inhaler 6 05/29/2017 at Unknown time  . calcitRIOL (ROCALTROL) 0.25 MCG  capsule Take 3 capsules (0.75 mcg total) by mouth every Monday, Wednesday, and Friday with hemodialysis.   unknown  . clopidogrel (PLAVIX) 75 MG tablet Take one tablet (24m) daily.  2 05/28/2017 at Unknown time  . gabapentin (NEURONTIN) 100 MG capsule Take 100 mg 2 (two) times daily by mouth.   05/28/2017 at Unknown time  . minoxidil (LONITEN) 10 MG tablet Take 10 mg 2 (two) times daily as needed by mouth (when blood pressure cuff reads "above normal").   0 few days ago?  . nicotine (NICODERM CQ - DOSED IN MG/24 HOURS) 14 mg/24hr patch Place 14 mg daily onto the skin.  0 few days ago  . ondansetron (ZOFRAN) 4 MG tablet Take 4 mg every 8 (eight) hours as needed by mouth for nausea or vomiting.    few days ago  . oxycodone (OXY-IR) 5 MG capsule Take 5 mg 2 (two) times daily as needed by mouth for pain.   05/29/2017 at afternoon  . senna (SENOKOT) 8.6 MG TABS tablet Take 1 tablet (8.6 mg total) by mouth daily as needed for mild constipation.  0   . sevelamer carbonate (RENVELA) 800 MG tablet Take 4 tablets (3,200 mg total) by mouth 3 (three) times daily with meals.     .Marland KitchenSpacer/Aero-Holding Chambers (AEROCHAMBER MV) inhaler Use as instructed 1 each 2   . zolpidem (AMBIEN) 10 MG tablet Take 1 tablet (10 mg total) by mouth at bedtime as needed for sleep.       Musculoskeletal: Strength & Muscle Tone: within normal limits Gait & Station: normal Patient leans: N/A  Psychiatric Specialty Exam: I reviewed physical examination performed on medical floor and agree with the findings. Physical Exam  Nursing note and vitals reviewed. Psychiatric: His speech is normal and behavior is normal. Thought content normal. His affect is angry. Cognition and memory are normal. He expresses impulsivity.    Review of Systems  Respiratory: Positive for shortness of breath.   Cardiovascular: Positive for leg swelling.  Skin:       Dressed wound of L arm  Neurological: Negative.   Psychiatric/Behavioral: The  patient is nervous/anxious and has insomnia.     Blood pressure (!) 140/97, pulse 88, temperature 98.6 F (37 C), temperature source Oral, resp. rate 16, weight 76.1 kg (167 lb 12.3 oz), SpO2 98 %.Body mass index is 24.78 kg/m.  See SRA.                                                  Sleep:       Treatment Plan Summary: Daily contact with patient to assess and evaluate symptoms and progress in treatment and Medication management   Mr. HRajanis a 56year old male with no psychiatric history who threatened suicide to get his children's attention.  #Suicidal ideation -patient adamantly denies any thoughts, intention od plans to hurt himself or others -able  to contract for safety in the hospital  #Mood -denies any symptoms of depression and declines treatment -Prolonged QTc excludes all antidepressants  #ESRD -Dr. Juleen China, nephrology will see the patient in the morning -continue Renvela 3,200 mg TID  #HTN -continue Monoxidil 10 mg BID PRN "above normal blood pressure"  #Pain -continue Neurontin 100 mg BID -continue Percocet 5 mg BID PRN pain  #L arm wound -wound consult S/p evacuation of hematoma on 11/21, exploration of left upper arm on 11/23 with ligation of his fistula and placement of VAC and exploration of left arm on 11/25 with ligation of brachiocephalic fistula. Required 11 units of PRBC. Stable now. -vascular surgery recommendations:   Left arm wound care hydrogel to wound base, saline wet guaze, Xeroform non stick guaze to surrounding superficial skin wounds and cover with dry guaze Kerlex and ace mild compression. Wet guaze with saline prior to removing dressing for daily changes. We do not want to cause bleeding.  Outpatient wound care  Follow up in 2 weeks  #Atrial flutter -continue Plavix  #COPD -Albuterol inhaler  #Insomnia -Ambien 10 mg nightly  #Constipation -Senna nightly  #Smoking cessation -Nicotine patch  available  #Disposition -discharge to home with home wound care -follow up with regular providers    Observation Level/Precautions:  15 minute checks  Laboratory:  CBC Chemistry Profile UDS UA  Psychotherapy:    Medications:    Consultations:    Discharge Concerns:    Estimated LOS:  Other:     Physician Treatment Plan for Primary Diagnosis: Adjustment disorder with mixed anxiety and depressed mood Long Term Goal(s): Improvement in symptoms so as ready for discharge  Short Term Goals: Ability to identify changes in lifestyle to reduce recurrence of condition will improve, Ability to verbalize feelings will improve, Ability to disclose and discuss suicidal ideas, Ability to demonstrate self-control will improve, Ability to identify and develop effective coping behaviors will improve and Ability to identify triggers associated with substance abuse/mental health issues will improve  Physician Treatment Plan for Secondary Diagnosis: Principal Problem:   Adjustment disorder with mixed anxiety and depressed mood Active Problems:   Tobacco use disorder   Chronic hepatitis B (HCC)   Chronic hepatitis C without hepatic coma (HCC)   End stage renal disease ([place])   Essential hypertension   COPD (chronic obstructive pulmonary disease) ([place])   Suicidal ideation   Arm wound, left, sequela  Long Term Goal(s): NA  Short Term Goals: NA  I certify that inpatient services furnished can reasonably be expected to improve the patient's condition.    Orson Slick, MD 11/30/20186:26 PM

## 2017-06-10 NOTE — Progress Notes (Signed)
Patient admitted on to unit. Patient is alert and oriented X 4. Patient denies SI, HI and AVH. Patient states the only reason why he said he was suicidal was to make his two sons start to speak again. Patient has a wound on his left arm which is dressed. Patient states medical staff was dressing wound 2-3 times a day because of drainage. Patient's left arm and hand has +4 edema. Patient's feet are also +3 edema. Patients skin is dry and flaky. Finger nails are peeling with specks of dried blood. Patient has belongings in the locker, refused to take any clothing to room. Patient oriented to the unit. Patient went down to eat dinner. Patient's foot of bed and head was elevated due to swelling.

## 2017-06-11 MED ORDER — BIOTENE DRY MOUTH MT LIQD
15.0000 mL | OROMUCOSAL | Status: DC | PRN
  Filled 2017-06-11: qty 15

## 2017-06-11 MED ORDER — OXYCODONE-ACETAMINOPHEN 5-325 MG PO TABS
1.0000 | ORAL_TABLET | Freq: Every day | ORAL | Status: AC
  Administered 2017-06-11: 1 via ORAL
  Filled 2017-06-11: qty 1

## 2017-06-11 MED ORDER — TRIAMCINOLONE 0.1 % CREAM:EUCERIN CREAM 1:1
TOPICAL_CREAM | Freq: Three times a day (TID) | CUTANEOUS | Status: DC | PRN
  Administered 2017-06-11: 1 via TOPICAL
  Filled 2017-06-11: qty 1

## 2017-06-11 MED ORDER — OXYCODONE-ACETAMINOPHEN 5-325 MG PO TABS
1.0000 | ORAL_TABLET | Freq: Once | ORAL | Status: AC
  Administered 2017-06-11: 1 via ORAL
  Filled 2017-06-11: qty 1

## 2017-06-11 MED ORDER — ALBUTEROL SULFATE HFA 108 (90 BASE) MCG/ACT IN AERS
1.0000 | INHALATION_SPRAY | RESPIRATORY_TRACT | Status: DC | PRN
  Administered 2017-06-11 – 2017-06-12 (×3): 2 via RESPIRATORY_TRACT
  Filled 2017-06-11: qty 6.7

## 2017-06-11 NOTE — [facility] Group Notes (Signed)
06/11/2017 1:15pm  Type of Therapy and Topic:  Group Therapy:  Healthy Self Image and Positive Change  Participation Level:  Did Not Attend   Description of Group:  In this group, patients will compare and contrast their current "I am...." statements to the visions they identify as desirable for their lives.  Patients discuss fears and how they can make positive changes in their cognitions that will positively impact their behaviors.  Facilitator played a motivational 3-minute speech and patients were left with the task of thinking about what "I am...." statements they can start using in their lives immediately.  Therapeutic Goals: 1. Patient will state their current self-perception as expressed in an "I Am" statement 2. Patient will contrast this with their desired vision for their live 3. Patient will identify 3 fears that negatively impact their behavior 4. Patient will discuss cognitive distortions that stem from their fears 5. Patient will verbalize statements that challenge their cognitive distortions  Summary of Patient Progress:  Pt was invited to group but did not show.     Therapeutic Modalities Cognitive Behavioral Therapy Motivational Interviewing  Nabeel Gladson  CUEBAS-COLON, LCSW 06/11/2017 11:36 AM

## 2017-06-11 NOTE — Progress Notes (Signed)
Patient has been biting his fingernails down to the skin layer of nail bed. Majority of fingernail beds on both hands are noted to be bloody. Areas covered with telfa and bandaid and patient encouraged not to bite the areas. Patient replies, "I'll try but can't help it."

## 2017-06-11 NOTE — Progress Notes (Signed)
Patient discovered to have chewed on thumb to the point of bleeding. Clean dry dressing applied with guaze and paper tape. Instructed patient to not chew on self. Patient states "It's my nerves, you wont understand, no one understand."

## 2017-06-11 NOTE — Plan of Care (Signed)
Patient in bed resting with eyes closed majority of shift. Patient makes frequent requests for a variety of reasons. Alert and oriented x 4, gait is unsteady. Does not eat meals, requests cereal stating, "That's all I eat at home." L) arm dressing changed with copious amount of drainage noted on dressing. Patient complains of pain to the site. PRN pain medications administered with very little effects. Dialysis MD in this afternoon to assess area and stated that he would send a surgeon down to assess the area as well. Fluid restriction in place and regulated by this nurse. Denies SI/HI/AVH. Will continue to monitor and notify MD of any acute changes.

## 2017-06-11 NOTE — [facility] Group Notes (Signed)
[facility] Group Notes:  (Nursing/MHT/Case Management/Adjunct)  Date:  06/11/2017  Time:  9:28 PM  Type of Therapy:  Group Therapy  Participation Level:  Did Not Attend  Participation Quality Summary of Progress/Problems:  [name] 06/11/2017, 9:28 PM

## 2017-06-11 NOTE — Progress Notes (Signed)
Patient in bed resting with eyes closed majority of shift. Patient makes frequent requests for a variety of reasons. Alert and oriented x 4, gait is unsteady. Does not eat meals, requests cereal stating, "That's all I eat at home." L) arm dressing changed with copious amount of drainage noted on dressing. Patient complains of pain to the site. PRN pain medications administered with very little effects. Dialysis MD in this afternoon to assess area and stated that he would send a surgeon down to assess the area as well. Fluid restriction in place and regulated by this nurse. Denies SI/HI/AVH. Will continue to monitor and notify MD of any acute changes.

## 2017-06-11 NOTE — Consult Note (Signed)
[facility]  CONSULT NOTE    Date: 06/11/2017                  Patient Name:  [name]  MRN: [id number]  DOB: 1960-12-02  Age / Sex: 56 y.o., male         PCP: [name], MD                 Service Requesting Consult: Dr. Bary Leriche                 Reason for Consult: End Stage Renal Disease            History of Present Illness: [name] is a 56 y.o. black male with end stage renal disease on hemodialysis, left AVF hematoma status post evacuation on 11/21, ligation on 11/23 and 11/25 by Dr. Oneida Alar,  Hypertension, chronic hepatitis C, chronic hepatitis B, COPD, GERD, substance abuse, depression/anxiety, congestive heart failure who was admitted to [facility] on 06/10/2017 for depression  Prolonged hospitalization at [facility] from 11/18 to 11/30 for AVF hematoma with several surgeries and PRBC transfusions. He has been transferred to [facility] for depression.   Last hemodialysis yesterday with RIJ permcath.   Wound care monitoring AVF.    Medications: Outpatient medications: Medications Prior to Admission  Medication Sig Dispense Refill Last Dose  . acetaminophen (TYLENOL) 500 MG tablet Take 500-1,000 mg every 6 (six) hours as needed by mouth for headache (pain).    few days ago  . albuterol (PROVENTIL HFA;VENTOLIN HFA) 108 (90 Base) MCG/ACT inhaler Inhale 2 puffs into the lungs every 6 (six) hours as needed for wheezing or shortness of breath. 1 Inhaler 6 05/29/2017 at Unknown time  . calcitRIOL (ROCALTROL) 0.25 MCG capsule Take 3 capsules (0.75 mcg total) by mouth every Monday, Wednesday, and Friday with hemodialysis.   unknown  . clopidogrel (PLAVIX) 75 MG tablet Take one tablet (75mg ) daily.  2 05/28/2017 at Unknown time  . gabapentin (NEURONTIN) 100 MG capsule Take 100 mg 2 (two) times daily by mouth.   05/28/2017 at Unknown time  . minoxidil (LONITEN) 10 MG tablet Take 10 mg 2 (two) times daily as needed by  mouth (when blood pressure cuff reads "above normal").   0 few days ago?  . nicotine (NICODERM CQ - DOSED IN MG/24 HOURS) 14 mg/24hr patch Place 14 mg daily onto the skin.  0 few days ago  . ondansetron (ZOFRAN) 4 MG tablet Take 4 mg every 8 (eight) hours as needed by mouth for nausea or vomiting.    few days ago  . oxycodone (OXY-IR) 5 MG capsule Take 5 mg 2 (two) times daily as needed by mouth for pain.   05/29/2017 at afternoon  . senna (SENOKOT) 8.6 MG TABS tablet Take 1 tablet (8.6 mg total) by mouth daily as needed for mild constipation.  0   . sevelamer carbonate (RENVELA) 800 MG tablet Take 4 tablets (3,200 mg total) by mouth 3 (three) times daily with meals.     Marland Kitchen Spacer/Aero-Holding Chambers (AEROCHAMBER MV) inhaler Use as instructed 1 each 2   . zolpidem (AMBIEN) 10 MG tablet Take 1 tablet (10 mg total) by mouth at bedtime as needed for sleep.       Current medications: Current Facility-Administered Medications  Medication Dose Route Frequency Provider Last Rate Last Dose  . acetaminophen (TYLENOL) tablet 650 mg  650 mg Oral Q6H PRN Pucilowska, Wardell Honour, MD  650 mg at 06/11/17 1154  . albuterol (PROVENTIL) (2.5 MG/3ML) 0.083% nebulizer solution 2.5 mg  2.5 mg Inhalation Q6H PRN Pucilowska, Jolanta B, MD   2.5 mg at 06/11/17 0329  . alum & mag hydroxide-simeth (MAALOX/MYLANTA) 200-200-20 MG/5ML suspension 30 mL  30 mL Oral Q4H PRN Pucilowska, Jolanta B, MD      . clopidogrel (PLAVIX) tablet 75 mg  75 mg Oral Daily Pucilowska, Jolanta B, MD   75 mg at 06/11/17 0752  . gabapentin (NEURONTIN) capsule 100 mg  100 mg Oral BID Pucilowska, Jolanta B, MD   100 mg at 06/11/17 0752  . magnesium hydroxide (MILK OF MAGNESIA) suspension 30 mL  30 mL Oral Daily PRN Pucilowska, Jolanta B, MD      . minoxidil (LONITEN) tablet 10 mg  10 mg Oral BID PRN Pucilowska, Jolanta B, MD      . nicotine (NICODERM CQ - dosed in mg/24 hours) patch 21 mg  21 mg Transdermal Daily Pucilowska, Jolanta B, MD   21 mg  at 06/11/17 0752  . ondansetron (ZOFRAN) tablet 4 mg  4 mg Oral Q8H PRN Pucilowska, Jolanta B, MD      . oxyCODONE-acetaminophen (PERCOCET/ROXICET) 5-325 MG per tablet 1 tablet  1 tablet Oral BID PRN Pucilowska, Jolanta B, MD   1 tablet at 06/11/17 0752  . senna (SENOKOT) tablet 8.6 mg  1 tablet Oral QHS Pucilowska, Jolanta B, MD   8.6 mg at 06/10/17 2108  . sevelamer carbonate (RENVELA) tablet 3,200 mg  3,200 mg Oral TID WC Pucilowska, Jolanta B, MD   3,200 mg at 06/11/17 1154  . zolpidem (AMBIEN) tablet 10 mg  10 mg Oral QHS Pucilowska, Jolanta B, MD   10 mg at 06/10/17 2108      Allergies: Allergies  Allergen Reactions  . Aspirin Other (See Comments)    STOMACH PAIN  . Clonidine Derivatives Itching  . Tramadol Itching      Past Medical History: Past Medical History:  Diagnosis Date  . Adenomatous colon polyp    tubular  . Anemia   . Anxiety   . Arthritis    left shoulder  . Atherosclerosis of aorta ([place])   . Cardiomegaly   . Chest pain    DATE UNKNOWN, C/O PERIODICALLY  . Chronic kidney disease    dialysis - M/W/F  . Cocaine abuse ([place])   . COPD exacerbation ([place]) 08/17/2016  . Dialysis patient ([place])    Monday-Wednesday-Friday  . GERD (gastroesophageal reflux disease)    DATE UNKNOWN  . Hemorrhoids   . Hepatitis B, chronic ([place])   . Hepatitis C   . Hyperkalemia   . Hypertension   . Metabolic bone disease    Patient denies  . Nephrolithiasis   . Pneumonia   . Pulmonary edema   . Renal disorder   . Renal insufficiency   . Shortness of breath dyspnea    " for the last past year with this dialysis"  . Tubular adenoma of colon      Past Surgical History: Past Surgical History:  Procedure Laterality Date  . A/V FISTULAGRAM Left 05/26/2017   Procedure: A/V FISTULAGRAM;  Surgeon: Conrad Stearns, MD;  Location: [place] [facility];  Service: Cardiovascular;  Laterality: Left;  . AV FISTULA PLACEMENT  2012   BELIEVED WAS PLACED IN JUNE  . COLONOSCOPY    .  CORONARY STENT INTERVENTION N/A 02/22/2017   Procedure: CORONARY STENT INTERVENTION;  Surgeon: Nigel Mormon, MD;  Location: [facility]  INVASIVE CV LAB;  Service: Cardiovascular;  Laterality: N/A;  . HEMORRHOID BANDING    . I&D EXTREMITY Left 06/01/2017   Procedure: IRRIGATION AND DEBRIDEMENT LEFT ARM HEMATOMA WITH LIGATION OF LEFT ARM AV FISTULA;  Surgeon: Elam Dutch, MD;  Location: [place];  Service: Vascular;  Laterality: Left;  . INSERTION OF DIALYSIS CATHETER  05/30/2017  . INSERTION OF DIALYSIS CATHETER N/A 05/30/2017   Procedure: INSERTION OF DIALYSIS CATHETER;  Surgeon: Elam Dutch, MD;  Location: [facility] OR;  Service: Vascular;  Laterality: N/A;  . LEFT HEART CATH AND CORONARY ANGIOGRAPHY N/A 02/22/2017   Procedure: LEFT HEART CATH AND CORONARY ANGIOGRAPHY;  Surgeon: Nigel Mormon, MD;  Location: [place] CV [facility];  Service: Cardiovascular;  Laterality: N/A;  . LIGATION OF ARTERIOVENOUS  FISTULA Left 07/18/735   Procedure: Plication of Left Arm Arteriovenous Fistula;  Surgeon: Elam Dutch, MD;  Location: [place];  Service: Vascular;  Laterality: Left;  . POLYPECTOMY    . REVISON OF ARTERIOVENOUS FISTULA Left 07/17/2692   Procedure: PLICATION OF DISTAL ANEURYSMAL SEGEMENT OF LEFT UPPER ARM ARTERIOVENOUS FISTULA;  Surgeon: Elam Dutch, MD;  Location: [place];  Service: Vascular;  Laterality: Left;  . REVISON OF ARTERIOVENOUS FISTULA Left 8/54/6270   Procedure: Plication of Left Upper Arm Fistula ;  Surgeon: Waynetta Sandy, MD;  Location: [place];  Service: Vascular;  Laterality: Left;  . THROMBECTOMY W/ EMBOLECTOMY Left 06/05/2017   Procedure: EXPLORATION OF LEFT ARM FOR BLEEDING; OVERSEWED PROXIMAL FISTULA;  Surgeon: Angelia Mould, MD;  Location: [place];  Service: Vascular;  Laterality: Left;  . WOUND EXPLORATION Left 06/03/2017   Procedure: WOUND EXPLORATION WITH WOUND VAC APPLICATION TO LEFT ARM;  Surgeon: Angelia Mould, MD;  Location: [facility] OR;   Service: Vascular;  Laterality: Left;     Family History: Family History  Problem Relation Age of Onset  . Heart disease Mother   . Lung cancer Mother   . Heart disease Father   . Malignant hyperthermia Father   . COPD Father   . Throat cancer Sister   . Esophageal cancer Sister   . Hypertension Other   . COPD Other   . Colon cancer Neg Hx   . Colon polyps Neg Hx   . Rectal cancer Neg Hx   . Stomach cancer Neg Hx      Social History: Social History   Socioeconomic History  . Marital status: Single    Spouse name: Not on file  . Number of children: 3  . Years of education: 10  . Highest education level: Not on file  Social Needs  . Financial resource strain: Not on file  . Food insecurity - worry: Not on file  . Food insecurity - inability: Not on file  . Transportation needs - medical: Not on file  . Transportation needs - non-medical: Not on file  Occupational History  . Occupation: Unemployed  Tobacco Use  . Smoking status: Current Every Day Smoker    Packs/day: 0.50    Years: 43.00    Pack years: 21.50    Types: Cigarettes    Start date: 08/13/1973  . Smokeless tobacco: Never Used  . Tobacco comment: Less than 1/2 pk per day  Substance and Sexual Activity  . Alcohol use: Yes    Alcohol/week: 2.4 oz    Types: 1 Glasses of wine, 1 Cans of beer, 1 Shots of liquor, 1 Standard drinks or equivalent per week  . Drug use: Yes  Types: Marijuana, Cocaine  . Sexual activity: Not on file    Comment: "NOT LATELY" on cocaine  Other Topics Concern  . Not on file  Social History Narrative   Lives alone   Caffeine use: Coffee-rare   Soda- daily      [facility] (03/10/17):   Originally from [facility]. Previously worked trimming trees. No pets currently. No bird or mold exposure.      Review of Systems: Review of Systems  Constitutional: Negative for chills, diaphoresis, fever, malaise/fatigue and weight loss.  HENT: Negative.  Negative for congestion, ear  discharge, ear pain, hearing loss, nosebleeds, sinus pain, sore throat and tinnitus.   Eyes: Negative.  Negative for blurred vision, double vision, photophobia, pain, discharge and redness.  Respiratory: Negative.  Negative for cough, hemoptysis, sputum production, shortness of breath, wheezing and stridor.   Cardiovascular: Negative.  Negative for chest pain, palpitations, orthopnea, claudication, leg swelling and PND.  Gastrointestinal: Negative.  Negative for abdominal pain, blood in stool, constipation, diarrhea, heartburn, melena, nausea and vomiting.  Genitourinary: Negative.  Negative for dysuria, flank pain, frequency, hematuria and urgency.  Musculoskeletal: Negative.  Negative for back pain, falls, joint pain, myalgias and neck pain.  Skin: Negative.  Negative for itching and rash.  Neurological: Negative.  Negative for dizziness, tingling, tremors, sensory change, speech change, focal weakness, seizures, loss of consciousness, weakness and headaches.  Endo/Heme/Allergies: Negative for environmental allergies and polydipsia. Does not bruise/bleed easily.  Psychiatric/Behavioral: Positive for depression, substance abuse and suicidal ideas. Negative for hallucinations and memory loss. The patient is not nervous/anxious and does not have insomnia.     Vital Signs: Blood pressure (!) 147/83, pulse (!) 106, temperature 98.6 F (37 C), temperature source Oral, resp. rate 20, height 5\' 5"  (1.651 m), weight 76.1 kg (167 lb 12.3 oz), SpO2 98 %.  Weight trends: Filed Weights   06/10/17 1714  Weight: 76.1 kg (167 lb 12.3 oz)    Physical Exam: General: NAD, laying in bed  Head: Normocephalic, atraumatic. Moist oral mucosal membranes  Eyes: Anicteric, PERRL  Neck: Supple, trachea midline  Lungs:  Clear to auscultation  Heart: Regular rate and rhythm  Abdomen:  Soft, nontender,   Extremities: no peripheral edema.  Neurologic: Nonfocal, moving all four extremities  Skin: Left arm wound  7cm x7cm.   Access: RIJ permcath      Lab results: Basic Metabolic Panel: Recent Labs  Lab 06/08/17 0651 06/09/17 0636 06/10/17 0651 06/10/17 1248  NA 128* 129* 124* 131*  K 5.8* 4.9 5.6* 4.2  CL 92* 92* 88* 93*  CO2 25 26 24 29   GLUCOSE 111* 111* 88 119*  BUN 46* 32* 43* 17  CREATININE 7.64* 6.32* 7.99* 4.20*  CALCIUM 9.1 7.8* 8.0* 8.2*  PHOS 5.0*  --   --   --     Liver Function Tests: Recent Labs  Lab 06/04/17 1518 06/08/17 0651  AST 38  --   ALT 21  --   ALKPHOS 96  --   BILITOT 1.0  --   PROT 5.2*  --   ALBUMIN 2.9* 3.1*   No results for input(s): LIPASE, AMYLASE in the last 168 hours. No results for input(s): AMMONIA in the last 168 hours.  CBC: Recent Labs  Lab 06/04/17 2306  06/06/17 0552 06/07/17 0214 06/08/17 0650 06/09/17 0636 06/10/17 0651  WBC 11.2*   < > 11.1* 11.6* 12.0* 10.3 10.3  NEUTROABS 7.7  --   --   --   --   --   --  HGB 6.7*   < > 8.2* 8.7* 8.4* 8.4* 8.2*  HCT 19.3*   < > 23.9* 25.6* 25.2* 25.4* 24.8*  MCV 86.5   < > 88.2 89.8 89.0 90.1 88.6  PLT 71*   < > 79* 69* 93* 110* 112*   < > = values in this interval not displayed.    Cardiac Enzymes: No results for input(s): CKTOTAL, CKMB, CKMBINDEX, TROPONINI in the last 168 hours.  BNP: Invalid input(s): POCBNP  CBG: No results for input(s): GLUCAP in the last 168 hours.  Microbiology: Results for orders placed or performed during the hospital encounter of 05/29/17  Surgical PCR screen     Status: None   Collection Time: 06/03/17  8:39 AM  Result Value Ref Range Status   MRSA, PCR NEGATIVE NEGATIVE Final   Staphylococcus aureus NEGATIVE NEGATIVE Final    Comment: (NOTE) The Xpert SA Assay (FDA approved for NASAL specimens in patients 74 years of age and older), is one component of a comprehensive surveillance program. It is not intended to diagnose infection nor to guide or monitor treatment.     Coagulation Studies: No results for input(s): LABPROT, INR in the last  72 hours.  Urinalysis: No results for input(s): COLORURINE, LABSPEC, PHURINE, GLUCOSEU, HGBUR, BILIRUBINUR, KETONESUR, PROTEINUR, UROBILINOGEN, NITRITE, LEUKOCYTESUR in the last 72 hours.  Invalid input(s): APPERANCEUR    Imaging:  No results found.   Assessment & Plan: Mr. LEONIDES MINDER is a 56 y.o. black male with end stage renal disease on hemodialysis, left AVF hematoma status post evacuation on 11/21, ligation on 11/23 and 11/25 by Dr. Oneida Alar,  Hypertension, chronic hepatitis C, chronic hepatitis B, COPD, GERD, substance abuse, depression/anxiety, congestive heart failure who was admitted to [facility] on 06/10/2017 for depression.  MWF [facility]  1. End Stage Renal Disease: on hemodialysis MWF. Last treatment yesterday.  Complication of hemodialysis device. Status post evacuation for hematoma and now with open wound. Managed by wound care. Discussed care with Dr. Delana Meyer, vascular surgery.  - Hemodialysis through [facility] on Monday.   2. Hypertension: blood pressure at goal.  - Minoxidil.   3. Anemia of chronic kidney disease: hemoglobin 8.2. Status post 11 units PRBC from blood loss.  - EPO with HD treatment.   4. Secondary Hyperparathyroidism:  - Sevelamer with meals      LOS: [place], [place] 12/1/201812:58 PM

## 2017-06-11 NOTE — Progress Notes (Addendum)
[facility] MD Progress Note  06/11/2017 2:43 PM Mylez Venable  MRN:  [id number] Subjective:  Mr. Hubbert is c/o L arm pain.  His wound is clean w/ no s/s of infection, but it is quite large.  He also itches - both his L arm with his wound and his back.  He states his mood is low due to pain.  He is not suicidal, he just wanted to get his sons to start talking to one another again but so far they haven't.  He slept okay the night before.  Nephrology consult also visited today.   Principal Problem: Adjustment disorder with mixed anxiety and depressed mood Diagnosis:   Patient Active Problem List   Diagnosis Date Noted  . Adjustment disorder with mixed anxiety and depressed mood [F43.23] 06/10/2017  . Suicidal ideation [R45.851] 06/10/2017  . Arm wound, left, sequela [S41.102S] 06/10/2017  . Dyspnea [R06.00] 05/29/2017  . Tachycardia [R00.0] 05/29/2017  . Hyperkalemia [E87.5] 05/22/2017  . Acute metabolic encephalopathy [U83.72]   . Anemia [D64.9] 04/23/2017  . Ascites [R18.8] 04/23/2017  . COPD (chronic obstructive pulmonary disease) ([place]) [J44.9] 04/23/2017  . Acute on chronic respiratory failure with hypoxia ([place]) [J96.21] 03/25/2017  . Atrial flutter ([place]) [I48.92] 03/25/2017  . Arrhythmia [I49.9] 03/25/2017  . COPD GOLD II/ still smoking [J44.9] 09/27/2016  . Essential hypertension [I10] 09/27/2016  . Fluid overload [E87.70] 08/30/2016  . COPD exacerbation ([place]) [J44.1] 08/17/2016  . Hypertensive urgency [I16.0] 08/17/2016  . Respiratory failure ([place]) [J96.90] 08/17/2016  . Problem with dialysis access [facility]) [T82.898A] 07/23/2016  . End stage renal disease ([place]) [N18.6] 12/02/2015  . Chronic hepatitis B ([place]) [B18.1] 03/05/2014  . Chronic hepatitis C without hepatic coma ([place]) [B18.2] 03/05/2014  . Internal hemorrhoids with bleeding, swelling and itching [K64.8] 03/05/2014  . Thrombocytopenia ([place]) [D69.6] 03/05/2014  . Chest pain [R07.9] 02/27/2014  . Alcohol abuse [F10.10]  04/14/2009  . Tobacco use disorder [F17.200] 04/14/2009  . GANGLION CYST [M67.40] 04/14/2009   Total Time spent with patient: 20 minutes_0  Past Psychiatric History: None  Past Medical History:  Past Medical History:  Diagnosis Date  . Adenomatous colon polyp    tubular  . Anemia   . Anxiety   . Arthritis    left shoulder  . Atherosclerosis of aorta ([place])   . Cardiomegaly   . Chest pain    DATE UNKNOWN, C/O PERIODICALLY  . Chronic kidney disease    dialysis - M/W/F  . Cocaine abuse ([place])   . COPD exacerbation ([place]) 08/17/2016  . Dialysis patient ([place])    Monday-Wednesday-Friday  . GERD (gastroesophageal reflux disease)    DATE UNKNOWN  . Hemorrhoids   . Hepatitis B, chronic ([place])   . Hepatitis C   . Hyperkalemia   . Hypertension   . Metabolic bone disease    Patient denies  . Nephrolithiasis   . Pneumonia   . Pulmonary edema   . Renal disorder   . Renal insufficiency   . Shortness of breath dyspnea    " for the last past year with this dialysis"  . Tubular adenoma of colon     Past Surgical History:  Procedure Laterality Date  . A/V FISTULAGRAM Left 05/26/2017   Procedure: A/V FISTULAGRAM;  Surgeon: Conrad [place] York, MD;  Location: [place] [facility];  Service: Cardiovascular;  Laterality: Left;  . AV FISTULA PLACEMENT  2012   BELIEVED WAS PLACED IN JUNE  . COLONOSCOPY    . CORONARY STENT INTERVENTION N/A 02/22/2017  Procedure: CORONARY STENT INTERVENTION;  Surgeon: Nigel Mormon, MD;  Location: [place] [facility];  Service: Cardiovascular;  Laterality: N/A;  . HEMORRHOID BANDING    . I&D EXTREMITY Left 06/01/2017   Procedure: IRRIGATION AND DEBRIDEMENT LEFT ARM HEMATOMA WITH LIGATION OF LEFT ARM AV FISTULA;  Surgeon: Elam Dutch, MD;  Location: [place];  Service: Vascular;  Laterality: Left;  . INSERTION OF DIALYSIS CATHETER  05/30/2017  . INSERTION OF DIALYSIS CATHETER N/A 05/30/2017   Procedure: INSERTION OF DIALYSIS CATHETER;  Surgeon: Elam Dutch, MD;  Location: [facility] OR;  Service: Vascular;  Laterality: N/A;  . LEFT HEART CATH AND CORONARY ANGIOGRAPHY N/A 02/22/2017   Procedure: LEFT HEART CATH AND CORONARY ANGIOGRAPHY;  Surgeon: Nigel Mormon, MD;  Location: [place] [facility];  Service: Cardiovascular;  Laterality: N/A;  . LIGATION OF ARTERIOVENOUS  FISTULA Left 09/15/4678   Procedure: Plication of Left Arm Arteriovenous Fistula;  Surgeon: Elam Dutch, MD;  Location: [place];  Service: Vascular;  Laterality: Left;  . POLYPECTOMY    . REVISON OF ARTERIOVENOUS FISTULA Left 09/30/2246   Procedure: PLICATION OF DISTAL ANEURYSMAL SEGEMENT OF LEFT UPPER ARM ARTERIOVENOUS FISTULA;  Surgeon: Elam Dutch, MD;  Location: [place];  Service: Vascular;  Laterality: Left;  . REVISON OF ARTERIOVENOUS FISTULA Left 2/50/0370   Procedure: Plication of Left Upper Arm Fistula ;  Surgeon: Waynetta Sandy, MD;  Location: [place];  Service: Vascular;  Laterality: Left;  . THROMBECTOMY W/ EMBOLECTOMY Left 06/05/2017   Procedure: EXPLORATION OF LEFT ARM FOR BLEEDING; OVERSEWED PROXIMAL FISTULA;  Surgeon: Angelia Mould, MD;  Location: [place];  Service: Vascular;  Laterality: Left;  . WOUND EXPLORATION Left 06/03/2017   Procedure: WOUND EXPLORATION WITH WOUND VAC APPLICATION TO LEFT ARM;  Surgeon: Angelia Mould, MD;  Location: [facility] OR;  Service: Vascular;  Laterality: Left;   Family History:  Family History  Problem Relation Age of Onset  . Heart disease Mother   . Lung cancer Mother   . Heart disease Father   . Malignant hyperthermia Father   . COPD Father   . Throat cancer Sister   . Esophageal cancer Sister   . Hypertension Other   . COPD Other   . Colon cancer Neg Hx   . Colon polyps Neg Hx   . Rectal cancer Neg Hx   . Stomach cancer Neg Hx    Family Psychiatric  History: None Social History:  Social History   Substance and Sexual Activity  Alcohol Use Yes  . Alcohol/week: 2.4 oz  . Types: 1 Glasses of  wine, 1 Cans of beer, 1 Shots of liquor, 1 Standard drinks or equivalent per week     Social History   Substance and Sexual Activity  Drug Use Yes  . Types: Marijuana, Cocaine    Social History   Socioeconomic History  . Marital status: Single    Spouse name: None  . Number of children: 3  . Years of education: 10  . Highest education level: None  Social Needs  . Financial resource strain: None  . Food insecurity - worry: None  . Food insecurity - inability: None  . Transportation needs - medical: None  . Transportation needs - non-medical: None  Occupational History  . Occupation: Unemployed  Tobacco Use  . Smoking status: Current Every Day Smoker    Packs/day: 0.50    Years: 43.00    Pack years: 21.50    Types: Cigarettes  Start date: 08/13/1973  . Smokeless tobacco: Never Used  . Tobacco comment: Less than 1/2 pk per day  Substance and Sexual Activity  . Alcohol use: Yes    Alcohol/week: 2.4 oz    Types: 1 Glasses of wine, 1 Cans of beer, 1 Shots of liquor, 1 Standard drinks or equivalent per week  . Drug use: Yes    Types: Marijuana, Cocaine  . Sexual activity: None    Comment: "NOT LATELY" on cocaine  Other Topics Concern  . None  Social History Narrative   Lives alone   Caffeine use: Coffee-rare   Soda- daily      [facility] (03/10/17):   Originally from [facility]. Previously worked trimming trees. No pets currently. No bird or mold exposure.    Additional Social History:      Social history. He lives independently. He receives dialysis in [place], [place], Fri. He uses Medicaid ride                      Sleep: Fair  Appetite:  fair, he only wants to eat cereal   Current Medications: Current Facility-Administered Medications  Medication Dose Route Frequency Provider Last Rate Last Dose  . acetaminophen (TYLENOL) tablet 650 mg  650 mg Oral Q6H PRN Pucilowska, Jolanta B, MD   650 mg at 06/11/17 1154  . albuterol (PROVENTIL) (2.5 MG/3ML)  0.083% nebulizer solution 2.5 mg  2.5 mg Inhalation Q6H PRN Pucilowska, Jolanta B, MD   2.5 mg at 06/11/17 0329  . alum & mag hydroxide-simeth (MAALOX/MYLANTA) 200-200-20 MG/5ML suspension 30 mL  30 mL Oral Q4H PRN Pucilowska, Jolanta B, MD      . clopidogrel (PLAVIX) tablet 75 mg  75 mg Oral Daily Pucilowska, Jolanta B, MD   75 mg at 06/11/17 0752  . gabapentin (NEURONTIN) capsule 100 mg  100 mg Oral BID Pucilowska, Jolanta B, MD   100 mg at 06/11/17 0752  . magnesium hydroxide (MILK OF MAGNESIA) suspension 30 mL  30 mL Oral Daily PRN Pucilowska, Jolanta B, MD      . minoxidil (LONITEN) tablet 10 mg  10 mg Oral BID PRN Pucilowska, Jolanta B, MD      . nicotine (NICODERM CQ - dosed in mg/24 hours) patch 21 mg  21 mg Transdermal Daily Pucilowska, Jolanta B, MD   21 mg at 06/11/17 0752  . ondansetron (ZOFRAN) tablet 4 mg  4 mg Oral Q8H PRN Pucilowska, Jolanta B, MD      . oxyCODONE-acetaminophen (PERCOCET/ROXICET) 5-325 MG per tablet 1 tablet  1 tablet Oral BID PRN Pucilowska, Jolanta B, MD   1 tablet at 06/11/17 0752  . oxyCODONE-acetaminophen (PERCOCET/ROXICET) 5-325 MG per tablet 1 tablet  1 tablet Oral Once Jolene Schimke, MD      . senna [facility]) tablet 8.6 mg  1 tablet Oral QHS Pucilowska, Jolanta B, MD   8.6 mg at 06/10/17 2108  . sevelamer carbonate (RENVELA) tablet 3,200 mg  3,200 mg Oral TID WC Pucilowska, Jolanta B, MD   3,200 mg at 06/11/17 1154  . zolpidem (AMBIEN) tablet 10 mg  10 mg Oral QHS Pucilowska, Jolanta B, MD   10 mg at 06/10/17 2108    Lab Results:  Results for orders placed or performed during the hospital encounter of 05/29/17 (from the past 48 hour(s))  CBC     Status: Abnormal   Collection Time: 06/10/17  6:51 AM  Result Value Ref Range   WBC 10.3 4.0 -  10.5 K/uL   RBC 2.80 (L) 4.22 - 5.81 MIL/uL   Hemoglobin 8.2 (L) 13.0 - 17.0 g/dL   HCT 24.8 (L) 39.0 - 52.0 %   MCV 88.6 78.0 - 100.0 fL   MCH 29.3 26.0 - 34.0 pg   MCHC 33.1 30.0 - 36.0 g/dL   RDW 18.1 (H)  11.5 - 15.5 %   Platelets 112 (L) 150 - 400 K/uL    Comment: REPEATED TO VERIFY CONSISTENT WITH PREVIOUS RESULT   Basic metabolic panel     Status: Abnormal   Collection Time: 06/10/17  6:51 AM  Result Value Ref Range   Sodium 124 (L) 135 - 145 mmol/L   Potassium 5.6 (H) 3.5 - 5.1 mmol/L   Chloride 88 (L) 101 - 111 mmol/L   CO2 24 22 - 32 mmol/L   Glucose, Bld 88 65 - 99 mg/dL   BUN 43 (H) 6 - 20 mg/dL   Creatinine, Ser 7.99 (H) 0.61 - 1.24 mg/dL   Calcium 8.0 (L) 8.9 - 10.3 mg/dL   GFR calc non Af Amer 7 (L) >60 mL/min   GFR calc Af Amer 8 (L) >60 mL/min    Comment: (NOTE) The eGFR has been calculated using the CKD EPI equation. This calculation has not been validated in all clinical situations. eGFR's persistently <60 mL/min signify possible Chronic Kidney Disease.    Anion gap 12 5 - 15  Basic metabolic panel     Status: Abnormal   Collection Time: 06/10/17 12:48 PM  Result Value Ref Range   Sodium 131 (L) 135 - 145 mmol/L    Comment: DELTA CHECK NOTED   Potassium 4.2 3.5 - 5.1 mmol/L    Comment: DELTA CHECK NOTED   Chloride 93 (L) 101 - 111 mmol/L   CO2 29 22 - 32 mmol/L   Glucose, Bld 119 (H) 65 - 99 mg/dL   BUN 17 6 - 20 mg/dL   Creatinine, Ser 4.20 (H) 0.61 - 1.24 mg/dL    Comment: DELTA CHECK NOTED   Calcium 8.2 (L) 8.9 - 10.3 mg/dL   GFR calc non Af Amer 15 (L) >60 mL/min   GFR calc Af Amer 17 (L) >60 mL/min    Comment: (NOTE) The eGFR has been calculated using the CKD EPI equation. This calculation has not been validated in all clinical situations. eGFR's persistently <60 mL/min signify possible Chronic Kidney Disease.    Anion gap 9 5 - 15    Blood Alcohol level:  Lab Results  Component Value Date   ETH <5 12/13/2015   ETH <10 mg/dL 41/32/4401    Metabolic Disorder Labs: Lab Results  Component Value Date   HGBA1C 4.6 (L) 04/24/2017   MPG 85.32 04/24/2017   MPG 94 08/17/2016   No results found for: PROLACTIN Lab Results  Component Value Date    CHOL 103 08/17/2016   TRIG 97 08/17/2016   HDL 34 (L) 08/17/2016   CHOLHDL 3.0 08/17/2016   VLDL 19 08/17/2016   LDLCALC 50 08/17/2016   LDLCALC  01/30/2010    80        Total Cholesterol/HDL:CHD Risk Coronary Heart Disease Risk Table                     Men   Women  1/2 Average Risk   3.4   3.3  Average Risk       5.0   4.4  2 X Average Risk   9.6   7.1  3 X Average Risk  23.4   11.0        Use the calculated Patient Ratio above and the CHD Risk Table to determine the patient's CHD Risk.        ATP III CLASSIFICATION (LDL):  <100     mg/dL   Optimal  100-129  mg/dL   Near or Above                    Optimal  130-159  mg/dL   Borderline  160-189  mg/dL   High  >190     mg/dL   Very High    Physical Findings:  Musculoskeletal: Strength & Muscle Tone: within normal limits Gait & Station: normal Patient leans: N/A   L arm - open roughly 7x7 wound , not actively bleeding, no oozing or s/s of infection  Psychiatric Specialty Exam: Physical Exam  ROS  Blood pressure (!) 147/83, pulse (!) 106, temperature 98.6 F (37 C), temperature source Oral, resp. rate 20, height 5' 5" (1.651 m), weight 76.1 kg (167 lb 12.3 oz), SpO2 98 %.Body mass index is 27.92 kg/m.  General Appearance: Disheveled  Eye Contact:  Good  Speech:  Clear and Coherent  Volume:  Normal  Mood:  Irritable  Affect:  Congruent  Thought Process:  Coherent  Orientation:  Full (Time, Place, and Person)  Thought Content:  no hallucinations or delusions  Suicidal Thoughts:  No  Homicidal Thoughts:  No  Memory:  Immediate;   Good Recent;   Good Remote;   Good  Judgement:  Poor  Insight:  Lacking  Psychomotor Activity:  Negative  Concentration:  Concentration: Fair and Attention Span: Fair  Recall:  Good  Fund of Knowledge:  Fair  Language:  Good  Akathisia:  No  Handed:  Right  AIMS (if indicated):     Assets:  Desire for Improvement Housing  ADL's:  Intact  Cognition:  WNL  Sleep:  Number of  Hours: 4.3   Physician Treatment Plan for Secondary Diagnosis: Principal Problem:   Adjustment disorder with mixed anxiety and depressed mood Active Problems:   Tobacco use disorder   Chronic hepatitis B (HCC)   Chronic hepatitis C without hepatic coma (HCC)   End stage renal disease (HCC)   Essential hypertension   COPD (chronic obstructive pulmonary disease) (HCC)   Suicidal ideation   Arm wound, left, sequela  Physician Treatment Plan for Primary Diagnosis: Adjustment disorder with mixed anxiety and depressed mood Long Term Goal(s): Improvement in symptoms so as ready for discharge  Short Term Goals: Ability to identify changes in lifestyle to reduce recurrence of condition will improve, Ability to verbalize feelings will improve, Ability to disclose and discuss suicidal ideas, Ability to demonstrate self-control will improve, Ability to identify and develop effective coping behaviors will improve and Ability to identify triggers associated with substance abuse/mental health issues will improve  Treatment Plan Summary: Daily contact with patient to assess and evaluate symptoms and progress in treatment and Medication management   Mr. Sandiford is a 56 year old male with no psychiatric history who threatened suicide to get his children's attention.  Patient also like with skin excoriation d/o - bites nails to the nubs until they're bloody, scratches skin compulsively, per nursing seen chewing(?) skin, so this skin wound is even more difficult for him to be comfortable through.    #Suicidal ideation -patient adamantly denies any thoughts, intention od plans to hurt himself or others -able to contract for safety  in the hospital  #Mood -denies any symptoms of depression and declines treatment -Prolonged QTc excludes all antidepressants  #ESRD -Dr. Juleen China, nephrology saw pt today plan for HD Monday, appreciate recs -continue Renvela 3,200 mg TID  #HTN -continue Monoxidil 10 mg  BID PRN "above normal blood pressure"  #Pain -continue Neurontin 100 mg BID -continue Percocet 5 mg BID PRN pain -given extra 56m percocet (divded) today  -biotene PRN dry mouth  -Eucerin tramcinolone cream given for itching on back, arm  #L arm wound -wound consult - cannot come until Monday. S/p evacuation of hematoma on 11/21, exploration of left upper arm on 11/23 with ligation of his fistula and placement of VAC and exploration of left arm on 11/25 with ligation of brachiocephalic fistula. Required 11 units of PRBC. Stable now. -vascular surgery recommendations:  Left arm wound care hydrogel to wound base, saline wet guaze, Xeroform non stick guaze to surrounding superficial skin wounds and cover with dry guaze Kerlex and ace mild compression. Wet guaze with saline prior to removing dressing for daily changes. We do not want to cause bleeding.  Outpatient wound care  Follow up in 2 weeks  #Atrial flutter -continue Plavix  #COPD -Albuterol inhaler  #Insomnia -Ambien 10 mg nightly  #Constipation -Senna nightly  #Smoking cessation -Nicotine patch available  #Disposition -discharge to home with home wound care -follow up with regular providers    Observation Level/Precautions:  15 minute checks  Laboratory:  CBC Chemistry Profile UDS UA  Psychotherapy:    Medications:    Consultations:    Discharge Concerns:    Estimated LOS:  Other:       I certify that inpatient services furnished can reasonably be expected to improve the patient's condition.     LJolene Schimke MD 06/11/2017, 2:43 PM

## 2017-06-11 NOTE — Plan of Care (Signed)
  Progressing Coping: Ability to use eye contact when communicating with others will improve 06/11/2017 1954 - Progressing by Anson [place], RN Note Patient is assertive during assessment.   Not Progressing Coping: Ability to interact with others will improve 06/11/2017 1954 - Not Progressing by Alyson Locket I, RN Note Patient isolates to rooms, states "I do not want to be around these crazy people." Participation in decision-making will improve 06/11/2017 1954 - Not Progressing by Alyson Locket I, RN Note Patient is impulsive, does not report when he need assistance Self-Concept: Ability to verbalize positive feelings about self will improve 06/11/2017 1954 - Not Progressing by Alyson Locket I, RN Note Patient states "until I see this arm healing, nothing is going to change."

## 2017-06-12 ENCOUNTER — Other Ambulatory Visit: Payer: Self-pay

## 2017-06-12 ENCOUNTER — Inpatient Hospital Stay
Admission: AD | Admit: 2017-06-12 | Discharge: 2017-06-17 | DRG: 981 | Disposition: A | Discharge status: Home Health | Payer: Medicare Other | Source: Ambulatory Visit | Attending: Internal Medicine | Admitting: Internal Medicine

## 2017-06-12 ENCOUNTER — Inpatient Hospital Stay: Payer: Medicare Other

## 2017-06-12 DIAGNOSIS — Z7902 Long term (current) use of antithrombotics/antiplatelets: Secondary | ICD-10-CM | POA: Diagnosis not present

## 2017-06-12 DIAGNOSIS — L0390 Cellulitis, unspecified: Secondary | ICD-10-CM | POA: Diagnosis not present

## 2017-06-12 DIAGNOSIS — I96 Gangrene, not elsewhere classified: Secondary | ICD-10-CM | POA: Diagnosis present

## 2017-06-12 DIAGNOSIS — Z992 Dependence on renal dialysis: Secondary | ICD-10-CM | POA: Diagnosis not present

## 2017-06-12 DIAGNOSIS — E871 Hypo-osmolality and hyponatremia: Secondary | ICD-10-CM | POA: Diagnosis present

## 2017-06-12 DIAGNOSIS — L03114 Cellulitis of left upper limb: Secondary | ICD-10-CM | POA: Diagnosis present

## 2017-06-12 DIAGNOSIS — Y718 Miscellaneous cardiovascular devices associated with adverse incidents, not elsewhere classified: Secondary | ICD-10-CM | POA: Diagnosis present

## 2017-06-12 DIAGNOSIS — D649 Anemia, unspecified: Secondary | ICD-10-CM | POA: Diagnosis not present

## 2017-06-12 DIAGNOSIS — E1122 Type 2 diabetes mellitus with diabetic chronic kidney disease: Secondary | ICD-10-CM | POA: Diagnosis present

## 2017-06-12 DIAGNOSIS — T8189XA Other complications of procedures, not elsewhere classified, initial encounter: Secondary | ICD-10-CM | POA: Diagnosis not present

## 2017-06-12 DIAGNOSIS — M79602 Pain in left arm: Secondary | ICD-10-CM | POA: Diagnosis not present

## 2017-06-12 DIAGNOSIS — F419 Anxiety disorder, unspecified: Secondary | ICD-10-CM | POA: Diagnosis present

## 2017-06-12 DIAGNOSIS — D631 Anemia in chronic kidney disease: Secondary | ICD-10-CM | POA: Diagnosis present

## 2017-06-12 DIAGNOSIS — Z79899 Other long term (current) drug therapy: Secondary | ICD-10-CM | POA: Diagnosis not present

## 2017-06-12 DIAGNOSIS — F4323 Adjustment disorder with mixed anxiety and depressed mood: Secondary | ICD-10-CM | POA: Diagnosis present

## 2017-06-12 DIAGNOSIS — T827XXA Infection and inflammatory reaction due to other cardiac and vascular devices, implants and grafts, initial encounter: Principal | ICD-10-CM | POA: Diagnosis present

## 2017-06-12 DIAGNOSIS — T829XXS Unspecified complication of cardiac and vascular prosthetic device, implant and graft, sequela: Secondary | ICD-10-CM | POA: Diagnosis not present

## 2017-06-12 DIAGNOSIS — B181 Chronic viral hepatitis B without delta-agent: Secondary | ICD-10-CM | POA: Diagnosis present

## 2017-06-12 DIAGNOSIS — Z9981 Dependence on supplemental oxygen: Secondary | ICD-10-CM

## 2017-06-12 DIAGNOSIS — F609 Personality disorder, unspecified: Secondary | ICD-10-CM

## 2017-06-12 DIAGNOSIS — B1920 Unspecified viral hepatitis C without hepatic coma: Secondary | ICD-10-CM | POA: Diagnosis present

## 2017-06-12 DIAGNOSIS — F17210 Nicotine dependence, cigarettes, uncomplicated: Secondary | ICD-10-CM | POA: Diagnosis present

## 2017-06-12 DIAGNOSIS — N2581 Secondary hyperparathyroidism of renal origin: Secondary | ICD-10-CM | POA: Diagnosis present

## 2017-06-12 DIAGNOSIS — T8149XA Infection following a procedure, other surgical site, initial encounter: Secondary | ICD-10-CM | POA: Diagnosis not present

## 2017-06-12 DIAGNOSIS — N186 End stage renal disease: Secondary | ICD-10-CM | POA: Diagnosis present

## 2017-06-12 DIAGNOSIS — I5022 Chronic systolic (congestive) heart failure: Secondary | ICD-10-CM | POA: Diagnosis present

## 2017-06-12 DIAGNOSIS — J9611 Chronic respiratory failure with hypoxia: Secondary | ICD-10-CM | POA: Diagnosis present

## 2017-06-12 DIAGNOSIS — K219 Gastro-esophageal reflux disease without esophagitis: Secondary | ICD-10-CM | POA: Diagnosis present

## 2017-06-12 DIAGNOSIS — J449 Chronic obstructive pulmonary disease, unspecified: Secondary | ICD-10-CM | POA: Diagnosis present

## 2017-06-12 DIAGNOSIS — S41102A Unspecified open wound of left upper arm, initial encounter: Secondary | ICD-10-CM | POA: Diagnosis not present

## 2017-06-12 DIAGNOSIS — R600 Localized edema: Secondary | ICD-10-CM | POA: Diagnosis not present

## 2017-06-12 DIAGNOSIS — I10 Essential (primary) hypertension: Secondary | ICD-10-CM | POA: Diagnosis not present

## 2017-06-12 DIAGNOSIS — B182 Chronic viral hepatitis C: Secondary | ICD-10-CM | POA: Diagnosis present

## 2017-06-12 DIAGNOSIS — M1990 Unspecified osteoarthritis, unspecified site: Secondary | ICD-10-CM | POA: Diagnosis not present

## 2017-06-12 DIAGNOSIS — I132 Hypertensive heart and chronic kidney disease with heart failure and with stage 5 chronic kidney disease, or end stage renal disease: Secondary | ICD-10-CM | POA: Diagnosis present

## 2017-06-12 DIAGNOSIS — E875 Hyperkalemia: Secondary | ICD-10-CM | POA: Diagnosis present

## 2017-06-12 LAB — BASIC METABOLIC PANEL
Anion gap: 12 (ref 5–15)
BUN: 43 mg/dL — ABNORMAL HIGH (ref 6–20)
CO2: 26 mmol/L (ref 22–32)
Calcium: 8.8 mg/dL — ABNORMAL LOW (ref 8.9–10.3)
Chloride: 88 mmol/L — ABNORMAL LOW (ref 101–111)
Creatinine, Ser: 7.63 mg/dL — ABNORMAL HIGH (ref 0.61–1.24)
GFR calc Af Amer: 8 mL/min — ABNORMAL LOW (ref >60)
GFR calc non Af Amer: 7 mL/min — ABNORMAL LOW (ref >60)
Glucose, Bld: 118 mg/dL — ABNORMAL HIGH (ref 65–99)
Potassium: 5.8 mmol/L — ABNORMAL HIGH (ref 3.5–5.1)
Sodium: 126 mmol/L — ABNORMAL LOW (ref 135–145)

## 2017-06-12 LAB — CBC WITH DIFFERENTIAL/PLATELET
Basophils Absolute: 0.2 10*3/uL — ABNORMAL HIGH (ref 0–0.1)
Basophils Relative: 2 %
Eosinophils Absolute: 0.2 10*3/uL (ref 0–0.7)
Eosinophils Relative: 2 %
HCT: 26.5 % — ABNORMAL LOW (ref 40.0–52.0)
Hemoglobin: 8.9 g/dL — ABNORMAL LOW (ref 13.0–18.0)
Lymphocytes Relative: 12 %
Lymphs Abs: 1.2 10*3/uL (ref 1.0–3.6)
MCH: 29.2 pg (ref 26.0–34.0)
MCHC: 33.4 g/dL (ref 32.0–36.0)
MCV: 87.5 fL (ref 80.0–100.0)
Monocytes Absolute: 1.1 10*3/uL — ABNORMAL HIGH (ref 0.2–1.0)
Monocytes Relative: 11 %
Neutro Abs: 7.5 10*3/uL — ABNORMAL HIGH (ref 1.4–6.5)
Neutrophils Relative %: 73 %
Platelets: 144 10*3/uL — ABNORMAL LOW (ref 150–440)
RBC: 3.03 MIL/uL — ABNORMAL LOW (ref 4.40–5.90)
RDW: 18.8 % — ABNORMAL HIGH (ref 11.5–14.5)
WBC: 10.3 10*3/uL (ref 3.8–10.6)

## 2017-06-12 MED ORDER — ENOXAPARIN SODIUM 40 MG/0.4ML ~~LOC~~ SOLN: 40.0000 mg | SUBCUTANEOUS | Status: DC

## 2017-06-12 MED ORDER — SENNA 8.6 MG PO TABS: 1.0000 | ORAL_TABLET | Freq: Every day | ORAL | Status: DC | PRN

## 2017-06-12 MED ORDER — EPOETIN ALFA 10000 UNIT/ML IJ SOLN
10000.0000 [IU] | INTRAMUSCULAR | Status: DC
  Administered 2017-06-13 – 2017-06-17 (×3): 10000 [IU] via INTRAVENOUS

## 2017-06-12 MED ORDER — CALCITRIOL 0.5 MCG PO CAPS
0.7500 ug | ORAL_CAPSULE | ORAL | Status: DC
  Administered 2017-06-13 – 2017-06-17 (×3): 0.75 ug via ORAL
  Filled 2017-06-12 (×3): qty 1

## 2017-06-12 MED ORDER — HEPARIN SODIUM (PORCINE) 5000 UNIT/ML IJ SOLN
5000.0000 [IU] | Freq: Three times a day (TID) | INTRAMUSCULAR | Status: DC
  Administered 2017-06-12 – 2017-06-17 (×7): 5000 [IU] via SUBCUTANEOUS
  Filled 2017-06-12 (×13): qty 1

## 2017-06-12 MED ORDER — MINOXIDIL 10 MG PO TABS
10.0000 mg | ORAL_TABLET | Freq: Two times a day (BID) | ORAL | Status: DC | PRN
  Filled 2017-06-12: qty 1

## 2017-06-12 MED ORDER — DIPHENHYDRAMINE HCL 25 MG PO CAPS
25.0000 mg | ORAL_CAPSULE | Freq: Four times a day (QID) | ORAL | Status: DC | PRN
  Administered 2017-06-12 – 2017-06-17 (×4): 25 mg via ORAL
  Filled 2017-06-12 (×5): qty 1

## 2017-06-12 MED ORDER — OXYCODONE-ACETAMINOPHEN 5-325 MG PO TABS
1.0000 | ORAL_TABLET | Freq: Two times a day (BID) | ORAL | Status: DC
  Administered 2017-06-12: 1 via ORAL
  Filled 2017-06-12: qty 1

## 2017-06-12 MED ORDER — ALBUTEROL SULFATE (2.5 MG/3ML) 0.083% IN NEBU
2.5000 mg | INHALATION_SOLUTION | RESPIRATORY_TRACT | Status: DC | PRN
  Administered 2017-06-12: 2.5 mg via RESPIRATORY_TRACT
  Filled 2017-06-12: qty 3

## 2017-06-12 MED ORDER — ONDANSETRON HCL 4 MG PO TABS: 4.0000 mg | ORAL_TABLET | Freq: Three times a day (TID) | ORAL | Status: DC | PRN

## 2017-06-12 MED ORDER — GABAPENTIN 100 MG PO CAPS
100.0000 mg | ORAL_CAPSULE | Freq: Two times a day (BID) | ORAL | Status: DC
  Administered 2017-06-12 – 2017-06-16 (×9): 100 mg via ORAL
  Filled 2017-06-12 (×9): qty 1

## 2017-06-12 MED ORDER — NICOTINE 14 MG/24HR TD PT24
14.0000 mg | MEDICATED_PATCH | Freq: Every day | TRANSDERMAL | Status: DC
  Administered 2017-06-13 – 2017-06-17 (×5): 14 mg via TRANSDERMAL
  Filled 2017-06-12 (×6): qty 1

## 2017-06-12 MED ORDER — ZOLPIDEM TARTRATE 5 MG PO TABS
10.0000 mg | ORAL_TABLET | Freq: Every evening | ORAL | Status: DC | PRN
  Administered 2017-06-12 – 2017-06-16 (×5): 10 mg via ORAL
  Filled 2017-06-12 (×5): qty 2

## 2017-06-12 MED ORDER — ACETAMINOPHEN 500 MG PO TABS
500.0000 mg | ORAL_TABLET | Freq: Four times a day (QID) | ORAL | Status: DC | PRN
  Administered 2017-06-14: 500 mg via ORAL
  Administered 2017-06-16: 1000 mg via ORAL
  Filled 2017-06-12: qty 1
  Filled 2017-06-12: qty 2
  Filled 2017-06-12 (×4): qty 1

## 2017-06-12 MED ORDER — OXYCODONE HCL 5 MG PO TABS
5.0000 mg | ORAL_TABLET | ORAL | Status: DC | PRN
  Administered 2017-06-12 – 2017-06-15 (×9): 5 mg via ORAL
  Filled 2017-06-12 (×9): qty 1

## 2017-06-12 MED ORDER — SEVELAMER CARBONATE 800 MG PO TABS
3200.0000 mg | ORAL_TABLET | Freq: Three times a day (TID) | ORAL | Status: DC
  Administered 2017-06-12 – 2017-06-17 (×11): 3200 mg via ORAL
  Filled 2017-06-12 (×9): qty 4

## 2017-06-12 NOTE — Discharge Summary (Signed)
Physician Discharge Summary Note  Patient:  [name] is an 56 y.o., male MRN:  [id number] DOB:  April 06, 1961 Patient phone:  [phone] (home)  Patient address:   [place] [address],  Total Time spent with patient: 45 minutes  Date of Admission:  06/10/2017 Date of Discharge: 06/12/2017  Reason for Admission:  Depression, SI   Principal Problem: Adjustment disorder with mixed anxiety and depressed mood Discharge Diagnoses: Patient Active Problem List   Diagnosis Date Noted  . Adjustment disorder with mixed anxiety and depressed mood [F43.23] 06/10/2017  . Suicidal ideation [R45.851] 06/10/2017  . Arm wound, left, sequela [S41.102S] 06/10/2017  . Dyspnea [R06.00] 05/29/2017  . Tachycardia [R00.0] 05/29/2017  . Hyperkalemia [E87.5] 05/22/2017  . Acute metabolic encephalopathy [X32.35]   . Anemia [D64.9] 04/23/2017  . Ascites [R18.8] 04/23/2017  . COPD (chronic obstructive pulmonary disease) ([place]) [J44.9] 04/23/2017  . Acute on chronic respiratory failure with hypoxia ([place]) [J96.21] 03/25/2017  . Atrial flutter ([place]) [I48.92] 03/25/2017  . Arrhythmia [I49.9] 03/25/2017  . COPD GOLD II/ still smoking [J44.9] 09/27/2016  . Essential hypertension [I10] 09/27/2016  . Fluid overload [E87.70] 08/30/2016  . COPD exacerbation ([place]) [J44.1] 08/17/2016  . Hypertensive urgency [I16.0] 08/17/2016  . Respiratory failure ([place]) [J96.90] 08/17/2016  . Problem with dialysis access [facility]) [T82.898A] 07/23/2016  . End stage renal disease ([place]) [N18.6] 12/02/2015  . Chronic hepatitis B ([place]) [B18.1] 03/05/2014  . Chronic hepatitis C without hepatic coma ([place]) [B18.2] 03/05/2014  . Internal hemorrhoids with bleeding, swelling and itching [K64.8] 03/05/2014  . Thrombocytopenia ([place]) [D69.6] 03/05/2014  . Chest pain [R07.9] 02/27/2014  . Alcohol abuse [F10.10] 04/14/2009  . Tobacco use disorder [F17.200] 04/14/2009  . GANGLION CYST [M67.40] 04/14/2009     Past Psychiatric History: None  Past Medical History:  Past Medical History:  Diagnosis Date  . Adenomatous colon polyp    tubular  . Anemia   . Anxiety   . Arthritis    left shoulder  . Atherosclerosis of aorta ([place])   . Cardiomegaly   . Chest pain    DATE UNKNOWN, C/O PERIODICALLY  . Chronic kidney disease    dialysis - M/W/F  . Cocaine abuse ([place])   . COPD exacerbation ([place]) 08/17/2016  . Dialysis patient ([place])    Monday-Wednesday-Friday  . GERD (gastroesophageal reflux disease)    DATE UNKNOWN  . Hemorrhoids   . Hepatitis B, chronic ([place])   . Hepatitis C   . Hyperkalemia   . Hypertension   . Metabolic bone disease    Patient denies  . Nephrolithiasis   . Pneumonia   . Pulmonary edema   . Renal disorder   . Renal insufficiency   . Shortness of breath dyspnea    " for the last past year with this dialysis"  . Tubular adenoma of colon     Past Surgical History:  Procedure Laterality Date  . A/V FISTULAGRAM Left 05/26/2017   Procedure: A/V FISTULAGRAM;  Surgeon: Conrad [place], MD;  Location: [place] [facility];  Service: Cardiovascular;  Laterality: Left;  . AV FISTULA PLACEMENT  2012   BELIEVED WAS PLACED IN JUNE  . COLONOSCOPY    . CORONARY STENT INTERVENTION N/A 02/22/2017   Procedure: CORONARY STENT INTERVENTION;  Surgeon: Nigel Mormon, MD;  Location: [place] [facility];  Service: Cardiovascular;  Laterality: N/A;  . HEMORRHOID BANDING    . I&D EXTREMITY Left 06/01/2017   Procedure: IRRIGATION AND DEBRIDEMENT LEFT ARM HEMATOMA  WITH LIGATION OF LEFT ARM AV FISTULA;  Surgeon: Elam Dutch, MD;  Location: [place];  Service: Vascular;  Laterality: Left;  . INSERTION OF DIALYSIS CATHETER  05/30/2017  . INSERTION OF DIALYSIS CATHETER N/A 05/30/2017   Procedure: INSERTION OF DIALYSIS CATHETER;  Surgeon: Elam Dutch, MD;  [facility];  Service: Vascular;  Laterality: N/A;  . LEFT HEART CATH AND CORONARY ANGIOGRAPHY N/A 02/22/2017   Procedure:  LEFT HEART CATH AND CORONARY ANGIOGRAPHY;  Surgeon: Nigel Mormon, MD;  Location: [place] [facility];  Service: Cardiovascular;  Laterality: N/A;  . LIGATION OF ARTERIOVENOUS  FISTULA Left 01/16/4127   Procedure: Plication of Left Arm Arteriovenous Fistula;  Surgeon: Elam Dutch, MD;  Location: [place];  Service: Vascular;  Laterality: Left;  . POLYPECTOMY    . REVISON OF ARTERIOVENOUS FISTULA Left 7/86/7672   Procedure: PLICATION OF DISTAL ANEURYSMAL SEGEMENT OF LEFT UPPER ARM ARTERIOVENOUS FISTULA;  Surgeon: Elam Dutch, MD;  Location: [place];  Service: Vascular;  Laterality: Left;  . REVISON OF ARTERIOVENOUS FISTULA Left 0/94/7096   Procedure: Plication of Left Upper Arm Fistula ;  Surgeon: Waynetta Sandy, MD;  Location: [place];  Service: Vascular;  Laterality: Left;  . THROMBECTOMY W/ EMBOLECTOMY Left 06/05/2017   Procedure: EXPLORATION OF LEFT ARM FOR BLEEDING; OVERSEWED PROXIMAL FISTULA;  Surgeon: Angelia Mould, MD;  Location: [place];  Service: Vascular;  Laterality: Left;  . WOUND EXPLORATION Left 06/03/2017   Procedure: WOUND EXPLORATION WITH WOUND VAC APPLICATION TO LEFT ARM;  Surgeon: Angelia Mould, MD;  Location: [facility] OR;  Service: Vascular;  Laterality: Left;   Family History:  Family History  Problem Relation Age of Onset  . Heart disease Mother   . Lung cancer Mother   . Heart disease Father   . Malignant hyperthermia Father   . COPD Father   . Throat cancer Sister   . Esophageal cancer Sister   . Hypertension Other   . COPD Other   . Colon cancer Neg Hx   . Colon polyps Neg Hx   . Rectal cancer Neg Hx   . Stomach cancer Neg Hx    Family Psychiatric  History: none Social History:  Social History   Substance and Sexual Activity  Alcohol Use Yes  . Alcohol/week: 2.4 oz  . Types: 1 Glasses of wine, 1 Cans of beer, 1 Shots of liquor, 1 Standard drinks or equivalent per week     Social History   Substance and Sexual Activity  Drug  Use Yes  . Types: Marijuana, Cocaine    Social History   Socioeconomic History  . Marital status: Single    Spouse name: None  . Number of children: 3  . Years of education: 10  . Highest education level: None  Social Needs  . Financial resource strain: None  . Food insecurity - worry: None  . Food insecurity - inability: None  . Transportation needs - medical: None  . Transportation needs - non-medical: None  Occupational History  . Occupation: Unemployed  Tobacco Use  . Smoking status: Current Every Day Smoker    Packs/day: 0.50    Years: 43.00    Pack years: 21.50    Types: Cigarettes    Start date: 08/13/1973  . Smokeless tobacco: Never Used  . Tobacco comment: Less than 1/2 pk per day  Substance and Sexual Activity  . Alcohol use: Yes    Alcohol/week: 2.4 oz    Types: 1  Glasses of wine, 1 Cans of beer, 1 Shots of liquor, 1 Standard drinks or equivalent per week  . Drug use: Yes    Types: Marijuana, Cocaine  . Sexual activity: None    Comment: "NOT LATELY" on cocaine  Other Topics Concern  . None  Social History Narrative   Lives alone   Caffeine use: Coffee-rare   Soda- daily      [facility] (03/10/17):   Originally from [facility]. Previously worked trimming trees. No pets currently. No bird or mold exposure.     Hospital Course:  Patient admitted to [facility] unit. Within one day of admission he began to say that he was not suicidal and only said he was suicidal in an effort to get his sons to talk to each other again.  He mainly laid in bed and did not participate in the millieu.  He was given percocet 5-10mg  bid for pain management.  No medications for psychiatric illness were started due to poor kidney function.  He does not need psychiatric f/u as he has adequate coping skills and agrees to reach out for help if feeling suicidal. I lifted his commitment.   His L arm fistula wound s/p hematoma evacuation and exploration became a much more pressing issue, with concern  for infection and need for abx.  He was seen by both nephrology and vascular surgery who recommended transfer to surgery floor.   Musculoskeletal: Strength & Muscle Tone: within normal limits Gait & Station: normal Patient leans: N/A   L arm - open roughly 7x7 wound , not actively bleeding, no oozing Backside of left arm and left flank - darkened skin, slightly tender to touch warm  Psychiatric Specialty Exam: Physical Exam   ROS   Blood pressure (!) 143/92, pulse (!) 106, temperature 98.5 F (36.9 C), temperature source Oral, resp. rate 18, height 5\' 5"  (1.651 m), weight 76.1 kg (167 lb 12.3 oz), SpO2 97 %.Body mass index is 27.92 kg/m.  General Appearance: Disheveled  Eye Contact:  Good  Speech:  Clear and Coherent  Volume:  Normal  Mood:  Irritable  Affect:  Congruent  Thought Process:  Coherent  Orientation:  Full (Time, Place, and Person)  Thought Content:  no hallucinations or delusions  Suicidal Thoughts:  No  Homicidal Thoughts:  No  Memory:  Immediate;   Good Recent;   Good Remote;   Good  Judgement:  Poor  Insight:  Lacking  Psychomotor Activity:  Negative  Concentration:  Concentration: Fair and Attention Span: Fair  Recall:  Good  Fund of Knowledge:  Fair  Language:  Good  Akathisia:  No  Handed:  Right  AIMS (if indicated):     Assets:  Desire for Improvement Housing  ADL's:  Intact  Cognition:  WNL  Sleep:  Number of Hours: 3       Have you used any form of tobacco in the last 30 days? (Cigarettes, Smokeless Tobacco, Cigars, and/or Pipes): Yes  Has this patient used any form of tobacco in the last 30 days? (Cigarettes, Smokeless Tobacco, Cigars, and/or Pipes) Yes,   Blood Alcohol level:  Lab Results  Component Value Date   ETH <5 12/13/2015   ETH <10 mg/dL 16/01/3709    Metabolic Disorder Labs:  Lab Results  Component Value Date   HGBA1C 4.6 (L) 04/24/2017   MPG 85.32 04/24/2017   MPG 94 08/17/2016   No results found for:  PROLACTIN Lab Results  Component Value Date   CHOL 103  08/17/2016   TRIG 97 08/17/2016   HDL 34 (L) 08/17/2016   CHOLHDL 3.0 08/17/2016   VLDL 19 08/17/2016   LDLCALC 50 08/17/2016   LDLCALC  01/30/2010    80        Total Cholesterol/HDL:CHD Risk Coronary Heart Disease Risk Table                     Men   Women  1/2 Average Risk   3.4   3.3  Average Risk       5.0   4.4  2 X Average Risk   9.6   7.1  3 X Average Risk  23.4   11.0        Use the calculated Patient Ratio above and the CHD Risk Table to determine the patient's CHD Risk.        ATP III CLASSIFICATION (LDL):  <100     mg/dL   Optimal  100-129  mg/dL   Near or Above                    Optimal  130-159  mg/dL   Borderline  160-189  mg/dL   High  >190     mg/dL   Very High    See Psychiatric Specialty Exam and Suicide Risk Assessment completed by Attending Physician prior to discharge.  Discharge destination:  Other:  hospital floor  Is patient on multiple antipsychotic therapies at discharge:  No   Has Patient had three or more failed trials of antipsychotic monotherapy by history:  No  Recommended Plan for Multiple Antipsychotic Therapies: NA   Allergies as of 06/12/2017      Reactions   Aspirin Other (See Comments)   STOMACH PAIN   Clonidine Derivatives Itching   Tramadol Itching      Medication List    TAKE these medications     Indication  acetaminophen 500 MG tablet Commonly known as:  TYLENOL Take 500-1,000 mg every 6 (six) hours as needed by mouth for headache (pain).  Indication:  Pain   AEROCHAMBER MV inhaler Use as instructed  Indication:  Difficulty Breathing   albuterol 108 (90 Base) MCG/ACT inhaler Commonly known as:  PROVENTIL HFA;VENTOLIN HFA Inhale 2 puffs into the lungs every 6 (six) hours as needed for wheezing or shortness of breath.  Indication:  Asthma   calcitRIOL 0.25 MCG capsule Commonly known as:  ROCALTROL Take 3 capsules (0.75 mcg total) by mouth every Monday,  Wednesday, and Friday with hemodialysis.  Indication:  Low Amount of Calcium in the Blood   clopidogrel 75 MG tablet Commonly known as:  PLAVIX Take one tablet (75mg ) daily.  Indication:  Disease of the Peripheral Arteries   gabapentin 100 MG capsule Commonly known as:  NEURONTIN Take 100 mg 2 (two) times daily by mouth.  Indication:  Nerve Pain   minoxidil 10 MG tablet Commonly known as:  LONITEN Take 10 mg 2 (two) times daily as needed by mouth (when blood pressure cuff reads "above normal").  Indication:  High Blood Pressure Disorder   nicotine 14 mg/24hr patch Commonly known as:  NICODERM CQ - dosed in mg/24 hours Place 14 mg daily onto the skin.  Indication:  Nicotine Addiction   ondansetron 4 MG tablet Commonly known as:  ZOFRAN Take 4 mg every 8 (eight) hours as needed by mouth for nausea or vomiting.  Indication:  Nausea and Vomiting Following an Operation   oxycodone 5 MG capsule Commonly  known as:  OXY-IR Take 5 mg 2 (two) times daily as needed by mouth for pain.  Indication:  Acute Pain, Chronic Pain   senna 8.6 MG Tabs tablet Commonly known as:  SENOKOT Take 1 tablet (8.6 mg total) by mouth daily as needed for mild constipation.  Indication:  Constipation   sevelamer carbonate 800 MG tablet Commonly known as:  RENVELA Take 4 tablets (3,200 mg total) by mouth 3 (three) times daily with meals.  Indication:  High Amount of Phosphate in the Blood   zolpidem 10 MG tablet Commonly known as:  AMBIEN Take 1 tablet (10 mg total) by mouth at bedtime as needed for sleep.  Indication:  Trouble Sleeping        Follow-up recommendations:  Follow-up with primary care.  Psychiatric f/u not needed.   Signed: Jolene Schimke, MD 06/12/2017, 1:17 PM

## 2017-06-12 NOTE — Progress Notes (Signed)
[facility]  ROUNDING NOTE   Subjective:   Transferred from Behavioral health to [place] due to infected open wound.   Objective:  Vital signs in last 24 hours:  Temp:  [97.7 F (36.5 C)-98.5 F (36.9 C)] 97.7 F (36.5 C) (12/02 1537) Pulse Rate:  [105-106] 105 (12/02 1537) Resp:  [18-20] 18 (12/02 0647) BP: (142-151)/(80-92) 142/80 (12/02 1537) SpO2:  [97 %-100 %] 100 % (12/02 1537)  Weight change:  There were no vitals filed for this visit.  Intake/Output: No intake/output data recorded.   Intake/Output this shift:  Total I/O In: 240 [P.O.:240] Out: -   Physical Exam: General: NAD, laying in bed  Head: Normocephalic, atraumatic. Moist oral mucosal membranes  Eyes: Anicteric, PERRL  Neck: Supple, trachea midline  Lungs:  Clear to auscultation  Heart: Regular rate and rhythm  Abdomen:  Soft, nontender,   Extremities: no peripheral edema.  Neurologic: Nonfocal, moving all four extremities  Skin: Left arm wound with clean dressings.   Access: RIJ permcath    Basic Metabolic Panel: Recent Labs  Lab 06/08/17 0651 06/09/17 0636 06/10/17 0651 06/10/17 1248 06/12/17 1504  NA 128* 129* 124* 131* 126*  K 5.8* 4.9 5.6* 4.2 5.8*  CL 92* 92* 88* 93* 88*  CO2 25 26 24 29 26   GLUCOSE 111* 111* 88 119* 118*  BUN 46* 32* 43* 17 43*  CREATININE 7.64* 6.32* 7.99* 4.20* 7.63*  CALCIUM 9.1 7.8* 8.0* 8.2* 8.8*  PHOS 5.0*  --   --   --   --     Liver Function Tests: Recent Labs  Lab 06/08/17 0651  ALBUMIN 3.1*   No results for input(s): LIPASE, AMYLASE in the last 168 hours. No results for input(s): AMMONIA in the last 168 hours.  CBC: Recent Labs  Lab 06/07/17 0214 06/08/17 0650 06/09/17 0636 06/10/17 0651 06/12/17 1504  WBC 11.6* 12.0* 10.3 10.3 10.3  NEUTROABS  --   --   --   --  7.5*  HGB 8.7* 8.4* 8.4* 8.2* 8.9*  HCT 25.6* 25.2* 25.4* 24.8* 26.5*  MCV 89.8 89.0 90.1 88.6 87.5  PLT 69* 93* 110* 112* 144*    Cardiac Enzymes: No results for  input(s): CKTOTAL, CKMB, CKMBINDEX, TROPONINI in the last 168 hours.  BNP: Invalid input(s): POCBNP  CBG: No results for input(s): GLUCAP in the last 168 hours.  Microbiology: Results for orders placed or performed during the hospital encounter of 05/29/17  Surgical PCR screen     Status: None   Collection Time: 06/03/17  8:39 AM  Result Value Ref Range Status   MRSA, PCR NEGATIVE NEGATIVE Final   Staphylococcus aureus NEGATIVE NEGATIVE Final    Comment: (NOTE) The Xpert SA Assay (FDA approved for NASAL specimens in patients 16 years of age and older), is one component of a comprehensive surveillance program. It is not intended to diagnose infection nor to guide or monitor treatment.     Coagulation Studies: No results for input(s): LABPROT, INR in the last 72 hours.  Urinalysis: No results for input(s): COLORURINE, LABSPEC, PHURINE, GLUCOSEU, HGBUR, BILIRUBINUR, KETONESUR, PROTEINUR, UROBILINOGEN, NITRITE, LEUKOCYTESUR in the last 72 hours.  Invalid input(s): APPERANCEUR    Imaging: No results found.   Medications:    . [START ON 06/13/2017] calcitRIOL  0.75 mcg Oral Q M,W,F-HD  . gabapentin  100 mg Oral BID  . heparin subcutaneous  5,000 Units Subcutaneous Q8H  . nicotine  14 mg Transdermal Daily  . sevelamer carbonate  3,200 mg  Oral TID WC   acetaminophen, albuterol, minoxidil, ondansetron, oxyCODONE, senna, zolpidem  Assessment/ Plan:  [name] is a 56 y.o. black male with end stage renal disease on hemodialysis, left AVF hematoma status post evacuation on 11/21, ligation on 11/23 and 11/25 by Dr. Oneida Alar,  Hypertension, chronic hepatitis C, chronic hepatitis B, COPD, GERD, substance abuse, depression/anxiety, congestive heart failure who was admitted to [facility] on 06/10/2017 for depression.  MWF [facility]  1. End Stage Renal Disease: on hemodialysis MWF.   Complication of hemodialysis device. Status post evacuation for  hematoma and now with open wound. IV antibiotics.  Discussed care with Dr. Delana Meyer, vascular surgery.  - Hemodialysis through [facility] on Monday.   2. Hypertension: blood pressure at goal.  - Minoxidil.   3. Anemia of chronic kidney disease: hemoglobin 8.9. Status post 11 units PRBC from blood loss.  - EPO with HD treatment.   4. Secondary Hyperparathyroidism:  - Sevelamer with meals     LOS: 0 [name] 12/2/20186:57 PM

## 2017-06-12 NOTE — Consult Note (Signed)
[facility] Vascular Consult Note  MRN : [id number]  [name] is a 56 y.o. (1961-05-08) male who presents with chief complaint of No chief complaint on file. Marland Kitchen  History of Present Illness: I am asked to evaluate the patient by Dr. Margaretmary Eddy.  The patient was transferred to [facility] from [place] with an open wound of the left arm.  He is recently undergone multiple procedures secondary to a bleeding AV access.  He continues to complain of significant pain.  He also notes worsening swelling.  He denies fever chills.  Currently he is being maintained on hemodialysis via tunnel catheter.  Current Facility-Administered Medications  Medication Dose Route Frequency Provider Last Rate Last Dose  . albuterol (PROVENTIL) (2.5 MG/3ML) 0.083% nebulizer solution 2.5 mg  2.5 mg Nebulization Q4H PRN Nicholes Mango, MD        Past Medical History:  Diagnosis Date  . Adenomatous colon polyp    tubular  . Anemia   . Anxiety   . Arthritis    left shoulder  . Atherosclerosis of aorta ([place])   . Cardiomegaly   . Chest pain    DATE UNKNOWN, C/O PERIODICALLY  . Chronic kidney disease    dialysis - M/W/F  . Cocaine abuse ([place])   . COPD exacerbation ([place]) 08/17/2016  . Dialysis patient ([place])    Monday-Wednesday-Friday  . GERD (gastroesophageal reflux disease)    DATE UNKNOWN  . Hemorrhoids   . Hepatitis B, chronic ([place])   . Hepatitis C   . Hyperkalemia   . Hypertension   . Metabolic bone disease    Patient denies  . Nephrolithiasis   . Pneumonia   . Pulmonary edema   . Renal disorder   . Renal insufficiency   . Shortness of breath dyspnea    " for the last past year with this dialysis"  . Tubular adenoma of colon     Past Surgical History:  Procedure Laterality Date  . A/V FISTULAGRAM Left 05/26/2017   Procedure: A/V FISTULAGRAM;  Surgeon: Conrad Jarales, MD;  Location: [facility];  Service: Cardiovascular;   Laterality: Left;  . AV FISTULA PLACEMENT  2012   BELIEVED WAS PLACED IN JUNE  . COLONOSCOPY    . CORONARY STENT INTERVENTION N/A 02/22/2017   Procedure: CORONARY STENT INTERVENTION;  Surgeon: Nigel Mormon, MD;  Location: [facility];  Service: Cardiovascular;  Laterality: N/A;  . HEMORRHOID BANDING    . I&D EXTREMITY Left 06/01/2017   Procedure: IRRIGATION AND DEBRIDEMENT LEFT ARM HEMATOMA WITH LIGATION OF LEFT ARM AV FISTULA;  Surgeon: Elam Dutch, MD;  Location: [facility];  Service: Vascular;  Laterality: Left;  . INSERTION OF DIALYSIS CATHETER  05/30/2017  . INSERTION OF DIALYSIS CATHETER N/A 05/30/2017   Procedure: INSERTION OF DIALYSIS CATHETER;  Surgeon: Elam Dutch, MD;  Location: [facility] OR;  Service: Vascular;  Laterality: N/A;  . LEFT HEART CATH AND CORONARY ANGIOGRAPHY N/A 02/22/2017   Procedure: LEFT HEART CATH AND CORONARY ANGIOGRAPHY;  Surgeon: Nigel Mormon, MD;  Location: [facility];  Service: Cardiovascular;  Laterality: N/A;  . LIGATION OF ARTERIOVENOUS  FISTULA Left 12/18/4852   Procedure: Plication of Left Arm Arteriovenous Fistula;  Surgeon: Elam Dutch, MD;  Location: [facility];  Service: Vascular;  Laterality: Left;  . POLYPECTOMY    . REVISON OF ARTERIOVENOUS FISTULA Left 01/05/349   Procedure: PLICATION OF DISTAL ANEURYSMAL SEGEMENT OF  LEFT UPPER ARM ARTERIOVENOUS FISTULA;  Surgeon: Elam Dutch, MD;  Location: [facility];  Service: Vascular;  Laterality: Left;  . REVISON OF ARTERIOVENOUS FISTULA Left 0/62/6948   Procedure: Plication of Left Upper Arm Fistula ;  Surgeon: Waynetta Sandy, MD;  Location: [facility];  Service: Vascular;  Laterality: Left;  . THROMBECTOMY W/ EMBOLECTOMY Left 06/05/2017   Procedure: EXPLORATION OF LEFT ARM FOR BLEEDING; OVERSEWED PROXIMAL FISTULA;  Surgeon: Angelia Mould, MD;  Location: [facility];  Service: Vascular;  Laterality: Left;  . WOUND EXPLORATION Left 06/03/2017   Procedure: WOUND  EXPLORATION WITH WOUND VAC APPLICATION TO LEFT ARM;  Surgeon: Angelia Mould, MD;  Location: [facility] OR;  Service: Vascular;  Laterality: Left;    Social History Social History   Tobacco Use  . Smoking status: Current Every Day Smoker    Packs/day: 0.50    Years: 43.00    Pack years: 21.50    Types: Cigarettes    Start date: 08/13/1973  . Smokeless tobacco: Never Used  . Tobacco comment: Less than 1/2 pk per day  Substance Use Topics  . Alcohol use: Yes    Alcohol/week: 2.4 oz    Types: 1 Glasses of wine, 1 Cans of beer, 1 Shots of liquor, 1 Standard drinks or equivalent per week  . Drug use: Yes    Types: Marijuana, Cocaine    Family History Family History  Problem Relation Age of Onset  . Heart disease Mother   . Lung cancer Mother   . Heart disease Father   . Malignant hyperthermia Father   . COPD Father   . Throat cancer Sister   . Esophageal cancer Sister   . Hypertension Other   . COPD Other   . Colon cancer Neg Hx   . Colon polyps Neg Hx   . Rectal cancer Neg Hx   . Stomach cancer Neg Hx     Allergies  Allergen Reactions  . Aspirin Other (See Comments)    STOMACH PAIN  . Clonidine Derivatives Itching  . Tramadol Itching     REVIEW OF SYSTEMS (Negative unless checked)  Constitutional: [] Weight loss  [] Fever  [] Chills Cardiac: [] Chest pain   [] Chest pressure   [] Palpitations   [] Shortness of breath when laying flat   [] Shortness of breath at rest   [] Shortness of breath with exertion. Vascular:  [] Pain in legs with walking   [] Pain in legs at rest   [] Pain in legs when laying flat   [] Claudication   [] Pain in feet when walking  [] Pain in feet at rest  [] Pain in feet when laying flat   [] History of DVT   [] Phlebitis   [] Swelling in legs   [] Varicose veins   [] Non-healing ulcers Pulmonary:   [] Uses home oxygen   [] Productive cough   [] Hemoptysis   [] Wheeze  [] COPD   [] Asthma Neurologic:  [] Dizziness  [] Blackouts   [] Seizures   [] History of stroke    [] History of TIA  [] Aphasia   [] Temporary blindness   [] Dysphagia   [] Weakness or numbness in arms   [] Weakness or numbness in legs Musculoskeletal:  [] Arthritis   [] Joint swelling   [] Joint pain   [] Low back pain Hematologic:  [] Easy bruising  [] Easy bleeding   [] Hypercoagulable state   [] Anemic  [] Hepatitis Gastrointestinal:  [] Blood in stool   [] Vomiting blood  [] Gastroesophageal reflux/heartburn   [] Difficulty swallowing. Genitourinary:  [x] Chronic kidney disease   [] Difficult urination  [] Frequent urination  [] Burning with urination   []   Blood in urine Skin:  [] Rashes   [] Ulcers   [] Wounds Psychological:  [] History of anxiety   []  History of major depression.  Physical Examination  There were no vitals filed for this visit. There is no height or weight on file to calculate BMI. Gen:  WD/WN, NAD Head: [place]/AT, No temporalis wasting. Prominent temp pulse not noted. Ear/Nose/Throat: Hearing grossly intact, nares w/o erythema or drainage, oropharynx w/o Erythema/Exudate Eyes: Sclera non-icteric, conjunctiva clear Neck: Trachea midline.  No JVD.  Pulmonary:  Good air movement, respirations not labored, equal bilaterally.  Cardiac: RRR, normal S1, S2. Vascular: Left arm large open wound planned tissue with muscle belly exposed.  There is a 2-3 cm rim both above and below the ulcer of necrotic skin.  There is no odor there is no drainage.  There is 3-4+ pitting edema from the axilla to the wrist. Vessel Right Left  Radial Palpable Palpable  Ulnar Palpable Palpable  Brachial Palpable Palpable  Gastrointestinal: soft, non-tender/non-distended. No guarding/reflex.  Musculoskeletal: M/S 5/5 throughout.  Extremities without ischemic changes.  No deformity or atrophy. No edema. Neurologic: Sensation grossly intact in extremities.  Symmetrical.  Speech is fluent. Motor exam as listed above. Psychiatric: Judgment intact, Mood & affect appropriate for pt's clinical situation. Dermatologic: No rashes or  ulcers noted.  No cellulitis or open wounds. Lymph : No Cervical, Axillary, or Inguinal lymphadenopathy.      CBC Lab Results  Component Value Date   WBC 10.3 06/10/2017   HGB 8.2 (L) 06/10/2017   HCT 24.8 (L) 06/10/2017   MCV 88.6 06/10/2017   PLT 112 (L) 06/10/2017    BMET    Component Value Date/Time   NA 131 (L) 06/10/2017 1248   K 4.2 06/10/2017 1248   CL 93 (L) 06/10/2017 1248   CO2 29 06/10/2017 1248   GLUCOSE 119 (H) 06/10/2017 1248   BUN 17 06/10/2017 1248   CREATININE 4.20 (H) 06/10/2017 1248   CREATININE 9.18 (H) 02/15/2017 1425   CALCIUM 8.2 (L) 06/10/2017 1248   CALCIUM 8.1 (L) 07/11/2011 1117   GFRNONAA 15 (L) 06/10/2017 1248   GFRNONAA 6 (L) 02/15/2017 1425   GFRAA 17 (L) 06/10/2017 1248   GFRAA 7 (L) 02/15/2017 1425   Estimated Creatinine Clearance: 19.1 mL/min (A) (by C-G formula based on SCr of 4.2 mg/dL (H)).  COAG Lab Results  Component Value Date   INR 1.20 06/04/2017   INR 1.17 06/03/2017   INR 1.14 06/02/2017    Radiology Dg Chest 2 View  Result Date: 05/22/2017 CLINICAL DATA:  Shortness of breath and hypertension EXAM: CHEST  2 VIEW COMPARISON:  May 02, 2017 FINDINGS: There is mild elevation of the left hemidiaphragm, stable. There is mild left base atelectasis. Lungs elsewhere clear. There is cardiomegaly with pulmonary venous hypertension. There is aortic atherosclerosis. No evident adenopathy. No bone lesions. IMPRESSION: Left base atelectasis. No edema or consolidation. Stable cardiomegaly. There is aortic atherosclerosis. Aortic Atherosclerosis (ICD10-I70.0). Electronically Signed   By: Lowella Grip III M.D.   On: 05/22/2017 13:04   Dg Chest Port 1 View  Result Date: 05/30/2017 CLINICAL DATA:  Central line placement EXAM: PORTABLE CHEST 1 VIEW COMPARISON:  Portable exam 1813 hours compared to 05/29/2017 FINDINGS: Large bore dual-lumen RIGHT jugular central venous catheter with tip projecting over RIGHT atrium. Enlargement of  cardiac silhouette with pulmonary vascular congestion. Atherosclerotic calcification aorta. Minimal perihilar infiltrates likely pulmonary edema. No gross pleural effusion or pneumothorax. Osseous structures unremarkable. IMPRESSION: No pneumothorax following RIGHT jugular  line placement. Enlargement of cardiac silhouette with minimal pulmonary edema. Electronically Signed   By: Lavonia Dana M.D.   On: 05/30/2017 18:50   Dg Chest Portable 1 View  Result Date: 05/29/2017 CLINICAL DATA:  Shortness of Breath EXAM: PORTABLE CHEST 1 VIEW COMPARISON:  05/22/2017 FINDINGS: Cardiac shadow remains enlarged. Aortic calcifications are again seen. The lungs are well aerated bilaterally with mild interstitial changes consistent with early edema. No focal infiltrate or sizable effusion is noted. IMPRESSION: Mild interstitial edema. Electronically Signed   By: Inez Catalina M.D.   On: 05/29/2017 20:29   Dg Fluoro Guide Cv Line-no Report  Result Date: 05/30/2017 Fluoroscopy was utilized by the requesting physician.  No radiographic interpretation.      Assessment/Plan 1.  Complication dialysis access: Patient has undergone multiple surgeries at [facility] main campus.  At this time he appears to require further surgery for debridement of the nonviable skin edges however I am concerned that there may be a deeper abscess.  I therefore will obtain a CT scan and once this is reviewed make plans for surgical debridement.  In the meantime he will be restarted on antibiotics intravenously.  This is not an appropriate situation for the behavioral health ward and he will therefore be transferred to a surgical floor. 2.  End-stage renal disease: Patient will continue dialysis as ordered via his right IJ tunnel catheter 3.  Suicidal ideation: Patient has been cleared by psychiatry and we will not need a sitter at this point in time.  Appreciate psychiatry's help and input.   Hortencia Pilar, MD  06/12/2017 2:30  PM    This note was created with Dragon medical transcription system.  Any error is purely unintentional

## 2017-06-12 NOTE — Progress Notes (Signed)
Patient discharged from unit. Pt to be admitted to [name] for surgical consult. Pt denies SI, HI, AVH. Pt has open wound to left arm draining moderate amount of serosanguineous fluid. Pt has dark discoloration to back of arm and down Left flank area, area warm to touch with swelling. Pt left hand and arm swollen. Pt complaining of pain to arm and side. Surgeon in for consult, orders to send to [facility].

## 2017-06-12 NOTE — BHH Suicide Risk Assessment (Signed)
[facility] Discharge Suicide Risk Assessment   Principal Problem: Adjustment disorder with mixed anxiety and depressed mood Discharge Diagnoses:  Patient Active Problem List   Diagnosis Date Noted  . Adjustment disorder with mixed anxiety and depressed mood [F43.23] 06/10/2017  . Suicidal ideation [R45.851] 06/10/2017  . Arm wound, left, sequela [S41.102S] 06/10/2017  . Dyspnea [R06.00] 05/29/2017  . Tachycardia [R00.0] 05/29/2017  . Hyperkalemia [E87.5] 05/22/2017  . Acute metabolic encephalopathy [J62.83]   . Anemia [D64.9] 04/23/2017  . Ascites [R18.8] 04/23/2017  . COPD (chronic obstructive pulmonary disease) ([place]) [J44.9] 04/23/2017  . Acute on chronic respiratory failure with hypoxia ([place]) [J96.21] 03/25/2017  . Atrial flutter ([place]) [I48.92] 03/25/2017  . Arrhythmia [I49.9] 03/25/2017  . COPD GOLD II/ still smoking [J44.9] 09/27/2016  . Essential hypertension [I10] 09/27/2016  . Fluid overload [E87.70] 08/30/2016  . COPD exacerbation ([place]) [J44.1] 08/17/2016  . Hypertensive urgency [I16.0] 08/17/2016  . Respiratory failure ([place]) [J96.90] 08/17/2016  . Problem with dialysis access [facility]) [T82.898A] 07/23/2016  . End stage renal disease ([place]) [N18.6] 12/02/2015  . Chronic hepatitis B ([place]) [B18.1] 03/05/2014  . Chronic hepatitis C without hepatic coma ([place]) [B18.2] 03/05/2014  . Internal hemorrhoids with bleeding, swelling and itching [K64.8] 03/05/2014  . Thrombocytopenia ([place]) [D69.6] 03/05/2014  . Chest pain [R07.9] 02/27/2014  . Alcohol abuse [F10.10] 04/14/2009  . Tobacco use disorder [F17.200] 04/14/2009  . GANGLION CYST [M67.40] 04/14/2009    Total Time spent with patient: 45 minutes  Physical Findings:  Musculoskeletal: Strength & Muscle Tone: within normal limits Gait & Station: normal Patient leans: N/A   L arm - open roughly 7x7 wound , not actively bleeding, no oozing  Backside of left arm and left flank - darkened skin, slightly tender to touch warm   Psychiatric Specialty Exam: Physical Exam   ROS   Blood pressure (!) 143/92, pulse (!) 106, temperature 98.5 F (36.9 C), temperature source Oral, resp. rate 18, height 5\' 5"  (1.651 m), weight 76.1 kg (167 lb 12.3 oz), SpO2 97 %.Body mass index is 27.92 kg/m.  General Appearance: Disheveled  Eye Contact:  Good  Speech:  Clear and Coherent  Volume:  Normal  Mood:  Irritable  Affect:  Congruent  Thought Process:  Coherent  Orientation:  Full (Time, Place, and Person)  Thought Content:  no hallucinations or delusions  Suicidal Thoughts:  No  Homicidal Thoughts:  No  Memory:  Immediate;   Good Recent;   Good Remote;   Good  Judgement:  Poor  Insight:  Lacking  Psychomotor Activity:  Negative  Concentration:  Concentration: Fair and Attention Span: Fair  Recall:  Good  Fund of Knowledge:  Fair  Language:  Good  Akathisia:  No  Handed:  Right  AIMS (if indicated):     Assets:  Desire for Improvement Housing  ADL's:  Intact  Cognition:  WNL  Sleep:  Number of Hours: 3   Mental Status Per Nursing Assessment::   On Admission:  NA  Demographic Factors:  Low socioeconomic status, Living alone and Unemployed  Loss Factors: Decline in physical health  Historical Factors: NA  Risk Reduction Factors:   Sense of responsibility to family and Positive coping skills or problem solving skills  Continued Clinical Symptoms:  Chronic Pain  Cognitive Features That Contribute To Risk:  None    Suicide Risk:   Minimal: No identifiable suicidal ideation.  Patients presenting with few risk factors but with morbid ruminations; may be classified as minimal risk based on the severity  of the depressive symptoms   Patient presented with voiced SI but then on the unit stated he voiced SI in an effort to get his sons to talk to each other and he has no reason or plans to commit suicide.  Will lift commitment.    Plan Of Care/Follow-up recommendations:  Primary Care  Jolene Schimke, MD 06/12/2017, 1:10 PM

## 2017-06-12 NOTE — Progress Notes (Signed)
Pharmacist - Prescriber Communication  Enoxaparin changed to subcutaneous heparin due to ESRD HD.   Rhyann Berton A. [place], [place].D., BCPS Clinical Pharmacist 06/12/2017 15:24

## 2017-06-12 NOTE — Progress Notes (Addendum)
[facility] MD Progress Note  06/12/2017 10:41 AM [name]  MRN:  [id number] Subjective:  [name] is laying in bed.  He states his arm continues to hurt and his L flank hurts.  He has been scratching but is trying not to.  He does not want to get out of bed because he doesn't like the unit.  He finds the extra percocet to be helpful but wonders why they don't look like his outpatient pills.  No SI  Nursing documented 3 hours of sleep but patient sleeping when I saw him this morning, I had to wake him up to talk to him.  Principal Problem: Adjustment disorder with mixed anxiety and depressed mood Diagnosis:   Patient Active Problem List   Diagnosis Date Noted  . Adjustment disorder with mixed anxiety and depressed mood [F43.23] 06/10/2017  . Suicidal ideation [R45.851] 06/10/2017  . Arm wound, left, sequela [S41.102S] 06/10/2017  . Dyspnea [R06.00] 05/29/2017  . Tachycardia [R00.0] 05/29/2017  . Hyperkalemia [E87.5] 05/22/2017  . Acute metabolic encephalopathy [F64.33]   . Anemia [D64.9] 04/23/2017  . Ascites [R18.8] 04/23/2017  . COPD (chronic obstructive pulmonary disease) ([place]) [J44.9] 04/23/2017  . Acute on chronic respiratory failure with hypoxia ([place]) [J96.21] 03/25/2017  . Atrial flutter ([place]) [I48.92] 03/25/2017  . Arrhythmia [I49.9] 03/25/2017  . COPD GOLD II/ still smoking [J44.9] 09/27/2016  . Essential hypertension [I10] 09/27/2016  . Fluid overload [E87.70] 08/30/2016  . COPD exacerbation ([place]) [J44.1] 08/17/2016  . Hypertensive urgency [I16.0] 08/17/2016  . Respiratory failure ([place]) [J96.90] 08/17/2016  . Problem with dialysis access [facility]) [T82.898A] 07/23/2016  . End stage renal disease ([place]) [N18.6] 12/02/2015  . Chronic hepatitis B ([place]) [B18.1] 03/05/2014  . Chronic hepatitis C without hepatic coma ([place]) [B18.2] 03/05/2014  . Internal hemorrhoids with bleeding, swelling and itching [K64.8] 03/05/2014  . Thrombocytopenia ([place]) [D69.6] 03/05/2014  . Chest  pain [R07.9] 02/27/2014  . Alcohol abuse [F10.10] 04/14/2009  . Tobacco use disorder [F17.200] 04/14/2009  . GANGLION CYST [M67.40] 04/14/2009   Total Time spent with patient: 20 minutes\  Past Psychiatric History: None  Past Medical History:  Past Medical History:  Diagnosis Date  . Adenomatous colon polyp    tubular  . Anemia   . Anxiety   . Arthritis    left shoulder  . Atherosclerosis of aorta ([place])   . Cardiomegaly   . Chest pain    DATE UNKNOWN, C/O PERIODICALLY  . Chronic kidney disease    dialysis - M/W/F  . Cocaine abuse ([place])   . COPD exacerbation ([place]) 08/17/2016  . Dialysis patient ([place])    Monday-Wednesday-Friday  . GERD (gastroesophageal reflux disease)    DATE UNKNOWN  . Hemorrhoids   . Hepatitis B, chronic ([place])   . Hepatitis C   . Hyperkalemia   . Hypertension   . Metabolic bone disease    Patient denies  . Nephrolithiasis   . Pneumonia   . Pulmonary edema   . Renal disorder   . Renal insufficiency   . Shortness of breath dyspnea    " for the last past year with this dialysis"  . Tubular adenoma of colon     Past Surgical History:  Procedure Laterality Date  . A/V FISTULAGRAM Left 05/26/2017   Procedure: A/V FISTULAGRAM;  Surgeon: Conrad Clear Creek, MD;  Location: [place] [facility];  Service: Cardiovascular;  Laterality: Left;  . AV FISTULA PLACEMENT  2012   BELIEVED WAS PLACED IN JUNE  . COLONOSCOPY    .  CORONARY STENT INTERVENTION N/A 02/22/2017   Procedure: CORONARY STENT INTERVENTION;  Surgeon: Nigel Mormon, MD;  Location: [place] [facility];  Service: Cardiovascular;  Laterality: N/A;  . HEMORRHOID BANDING    . I&D EXTREMITY Left 06/01/2017   Procedure: IRRIGATION AND DEBRIDEMENT LEFT ARM HEMATOMA WITH LIGATION OF LEFT ARM AV FISTULA;  Surgeon: Elam Dutch, MD;  Location: [place];  Service: Vascular;  Laterality: Left;  . INSERTION OF DIALYSIS CATHETER  05/30/2017  . INSERTION OF DIALYSIS CATHETER N/A 05/30/2017   Procedure:  INSERTION OF DIALYSIS CATHETER;  Surgeon: Elam Dutch, MD;  Location: [facility] OR;  Service: Vascular;  Laterality: N/A;  . LEFT HEART CATH AND CORONARY ANGIOGRAPHY N/A 02/22/2017   Procedure: LEFT HEART CATH AND CORONARY ANGIOGRAPHY;  Surgeon: Nigel Mormon, MD;  Location: [place] [facility];  Service: Cardiovascular;  Laterality: N/A;  . LIGATION OF ARTERIOVENOUS  FISTULA Left 12/14/4648   Procedure: Plication of Left Arm Arteriovenous Fistula;  Surgeon: Elam Dutch, MD;  Location: [place];  Service: Vascular;  Laterality: Left;  . POLYPECTOMY    . REVISON OF ARTERIOVENOUS FISTULA Left 3/54/6568   Procedure: PLICATION OF DISTAL ANEURYSMAL SEGEMENT OF LEFT UPPER ARM ARTERIOVENOUS FISTULA;  Surgeon: Elam Dutch, MD;  Location: [place];  Service: Vascular;  Laterality: Left;  . REVISON OF ARTERIOVENOUS FISTULA Left 08/07/5168   Procedure: Plication of Left Upper Arm Fistula ;  Surgeon: Waynetta Sandy, MD;  Location: [place];  Service: Vascular;  Laterality: Left;  . THROMBECTOMY W/ EMBOLECTOMY Left 06/05/2017   Procedure: EXPLORATION OF LEFT ARM FOR BLEEDING; OVERSEWED PROXIMAL FISTULA;  Surgeon: Angelia Mould, MD;  Location: [place];  Service: Vascular;  Laterality: Left;  . WOUND EXPLORATION Left 06/03/2017   Procedure: WOUND EXPLORATION WITH WOUND VAC APPLICATION TO LEFT ARM;  Surgeon: Angelia Mould, MD;  Location: [facility] OR;  Service: Vascular;  Laterality: Left;   Family History:  Family History  Problem Relation Age of Onset  . Heart disease Mother   . Lung cancer Mother   . Heart disease Father   . Malignant hyperthermia Father   . COPD Father   . Throat cancer Sister   . Esophageal cancer Sister   . Hypertension Other   . COPD Other   . Colon cancer Neg Hx   . Colon polyps Neg Hx   . Rectal cancer Neg Hx   . Stomach cancer Neg Hx    Family Psychiatric  History: None Social History:  Social History   Substance and Sexual Activity  Alcohol Use  Yes  . Alcohol/week: 2.4 oz  . Types: 1 Glasses of wine, 1 Cans of beer, 1 Shots of liquor, 1 Standard drinks or equivalent per week     Social History   Substance and Sexual Activity  Drug Use Yes  . Types: Marijuana, Cocaine    Social History   Socioeconomic History  . Marital status: Single    Spouse name: None  . Number of children: 3  . Years of education: 10  . Highest education level: None  Social Needs  . Financial resource strain: None  . Food insecurity - worry: None  . Food insecurity - inability: None  . Transportation needs - medical: None  . Transportation needs - non-medical: None  Occupational History  . Occupation: Unemployed  Tobacco Use  . Smoking status: Current Every Day Smoker    Packs/day: 0.50    Years: 43.00    Pack  years: 21.50    Types: Cigarettes    Start date: 08/13/1973  . Smokeless tobacco: Never Used  . Tobacco comment: Less than 1/2 pk per day  Substance and Sexual Activity  . Alcohol use: Yes    Alcohol/week: 2.4 oz    Types: 1 Glasses of wine, 1 Cans of beer, 1 Shots of liquor, 1 Standard drinks or equivalent per week  . Drug use: Yes    Types: Marijuana, Cocaine  . Sexual activity: None    Comment: "NOT LATELY" on cocaine  Other Topics Concern  . None  Social History Narrative   Lives alone   Caffeine use: Coffee-rare   Soda- daily       Pulmonary (03/10/17):   Originally from [facility]. Previously worked trimming trees. No pets currently. No bird or mold exposure.    Additional Social History:      Social history. He lives independently. He receives dialysis in [place], [place], Fri. He uses Medicaid ride                      Sleep: Poor  Appetite:  fair, he only wants to eat cereal   Current Medications: Current Facility-Administered Medications  Medication Dose Route Frequency Provider Last Rate Last Dose  . acetaminophen (TYLENOL) tablet 650 mg  650 mg Oral Q6H PRN Pucilowska, Jolanta B, MD   650 mg at  06/11/17 1154  . albuterol (PROVENTIL HFA;VENTOLIN HFA) 108 (90 Base) MCG/ACT inhaler 1-2 puff  1-2 puff Inhalation Q4H PRN Jolene Schimke, MD   2 puff at 06/12/17 0705  . alum & mag hydroxide-simeth (MAALOX/MYLANTA) 200-200-20 MG/5ML suspension 30 mL  30 mL Oral Q4H PRN Pucilowska, Jolanta B, MD      . antiseptic oral rinse (BIOTENE) solution 15 mL  15 mL Mouth Rinse PRN Jolene Schimke, MD      . clopidogrel (PLAVIX) tablet 75 mg  75 mg Oral Daily Pucilowska, Jolanta B, MD   75 mg at 06/12/17 0817  . gabapentin (NEURONTIN) capsule 100 mg  100 mg Oral BID Pucilowska, Jolanta B, MD   100 mg at 06/12/17 0817  . magnesium hydroxide (MILK OF MAGNESIA) suspension 30 mL  30 mL Oral Daily PRN Pucilowska, Jolanta B, MD      . minoxidil (LONITEN) tablet 10 mg  10 mg Oral BID PRN Pucilowska, Jolanta B, MD      . nicotine (NICODERM CQ - dosed in mg/24 hours) patch 21 mg  21 mg Transdermal Daily Pucilowska, Jolanta B, MD   21 mg at 06/12/17 0817  . ondansetron (ZOFRAN) tablet 4 mg  4 mg Oral Q8H PRN Pucilowska, Jolanta B, MD      . oxyCODONE-acetaminophen (PERCOCET/ROXICET) 5-325 MG per tablet 1 tablet  1 tablet Oral BID PRN Pucilowska, Jolanta B, MD   1 tablet at 06/12/17 0816  . oxyCODONE-acetaminophen (PERCOCET/ROXICET) 5-325 MG per tablet 1 tablet  1 tablet Oral BID Jolene Schimke, MD   1 tablet at 06/12/17 1001  . senna (SENOKOT) tablet 8.6 mg  1 tablet Oral QHS Pucilowska, Jolanta B, MD   8.6 mg at 06/11/17 2111  . sevelamer carbonate (RENVELA) tablet 3,200 mg  3,200 mg Oral TID WC Pucilowska, Jolanta B, MD   3,200 mg at 06/12/17 0817  . triamcinolone 0.1 % cream : eucerin cream, 1:1   Topical TID PRN Jolene Schimke, MD   1 application at 83/38/25 2129  . zolpidem (AMBIEN) tablet 10 mg  10  mg Oral QHS Pucilowska, Jolanta B, MD   10 mg at 06/11/17 2111    Lab Results:  Results for orders placed or performed during the hospital encounter of 05/29/17 (from the past 48 hour(s))  Basic metabolic  panel     Status: Abnormal   Collection Time: 06/10/17 12:48 PM  Result Value Ref Range   Sodium 131 (L) 135 - 145 mmol/L    Comment: DELTA CHECK NOTED   Potassium 4.2 3.5 - 5.1 mmol/L    Comment: DELTA CHECK NOTED   Chloride 93 (L) 101 - 111 mmol/L   CO2 29 22 - 32 mmol/L   Glucose, Bld 119 (H) 65 - 99 mg/dL   BUN 17 6 - 20 mg/dL   Creatinine, Ser 4.20 (H) 0.61 - 1.24 mg/dL    Comment: DELTA CHECK NOTED   Calcium 8.2 (L) 8.9 - 10.3 mg/dL   GFR calc non Af Amer 15 (L) >60 mL/min   GFR calc Af Amer 17 (L) >60 mL/min    Comment: (NOTE) The eGFR has been calculated using the CKD EPI equation. This calculation has not been validated in all clinical situations. eGFR's persistently <60 mL/min signify possible Chronic Kidney Disease.    Anion gap 9 5 - 15    Blood Alcohol level:  Lab Results  Component Value Date   ETH <5 12/13/2015   ETH <10 mg/dL 16/01/3709    Metabolic Disorder Labs: Lab Results  Component Value Date   HGBA1C 4.6 (L) 04/24/2017   MPG 85.32 04/24/2017   MPG 94 08/17/2016   No results found for: PROLACTIN Lab Results  Component Value Date   CHOL 103 08/17/2016   TRIG 97 08/17/2016   HDL 34 (L) 08/17/2016   CHOLHDL 3.0 08/17/2016   VLDL 19 08/17/2016   LDLCALC 50 08/17/2016   LDLCALC  01/30/2010    80        Total Cholesterol/HDL:CHD Risk Coronary Heart Disease Risk Table                     Men   Women  1/2 Average Risk   3.4   3.3  Average Risk       5.0   4.4  2 X Average Risk   9.6   7.1  3 X Average Risk  23.4   11.0        Use the calculated Patient Ratio above and the CHD Risk Table to determine the patient's CHD Risk.        ATP III CLASSIFICATION (LDL):  <100     mg/dL   Optimal  100-129  mg/dL   Near or Above                    Optimal  130-159  mg/dL   Borderline  160-189  mg/dL   High  >190     mg/dL   Very High    Physical Findings:  Musculoskeletal: Strength & Muscle Tone: within normal limits Gait & Station:  normal Patient leans: N/A   L arm - open roughly 7x7 wound , not actively bleeding, no oozing Backside of left arm and left flank - darkened skin, slightly tender to touch warm   Psychiatric Specialty Exam: Physical Exam   ROS   Blood pressure (!) 143/92, pulse (!) 106, temperature 98.5 F (36.9 C), temperature source Oral, resp. rate 18, height 5' 5"  (1.651 m), weight 76.1 kg (167 lb 12.3 oz), SpO2 97 %.Body  mass index is 27.92 kg/m.  General Appearance: Disheveled  Eye Contact:  Good  Speech:  Clear and Coherent  Volume:  Normal  Mood:  Irritable  Affect:  Congruent  Thought Process:  Coherent  Orientation:  Full (Time, Place, and Person)  Thought Content:  no hallucinations or delusions  Suicidal Thoughts:  No  Homicidal Thoughts:  No  Memory:  Immediate;   Good Recent;   Good Remote;   Good  Judgement:  Poor  Insight:  Lacking  Psychomotor Activity:  Negative  Concentration:  Concentration: Fair and Attention Span: Fair  Recall:  Good  Fund of Knowledge:  Fair  Language:  Good  Akathisia:  No  Handed:  Right  AIMS (if indicated):     Assets:  Desire for Improvement Housing  ADL's:  Intact  Cognition:  WNL  Sleep:  Number of Hours: 3   Physician Treatment Plan for Secondary Diagnosis: Principal Problem:   Adjustment disorder with mixed anxiety and depressed mood Active Problems:   Tobacco use disorder   Chronic hepatitis B (HCC)   Chronic hepatitis C without hepatic coma (HCC)   End stage renal disease (HCC)   Essential hypertension   COPD (chronic obstructive pulmonary disease) (HCC)   Suicidal ideation   Arm wound, left, sequela  Physician Treatment Plan for Primary Diagnosis: Adjustment disorder with mixed anxiety and depressed mood Long Term Goal(s): Improvement in symptoms so as ready for discharge  Short Term Goals: Ability to identify changes in lifestyle to reduce recurrence of condition will improve, Ability to verbalize feelings will improve,  Ability to disclose and discuss suicidal ideas, Ability to demonstrate self-control will improve, Ability to identify and develop effective coping behaviors will improve and Ability to identify triggers associated with substance abuse/mental health issues will improve  Treatment Plan Summary: Daily contact with patient to assess and evaluate symptoms and progress in treatment and Medication management   [name] is a 56 year old male with no psychiatric history who threatened suicide to get his children's attention.  Patient also like with skin excoriation d/o - bites nails to the nubs until they're bloody, scratches skin compulsively, per nursing seen chewing(?) skin, so this skin wound is even more difficult for him to be comfortable through.    #Suicidal ideation -patient adamantly denies any thoughts, intention od plans to hurt himself or others -able to contract for safety in the hospital  #Mood -denies any symptoms of depression and declines treatment -Prolonged QTc excludes all antidepressants  #ESRD -Dr. Juleen China, nephrology saw pt today plan for HD Monday, appreciate recs -continue Renvela 3,200 mg TID  #HTN -continue Monoxidil 10 mg BID PRN "above normal blood pressure"  #Pain -continue Neurontin 100 mg BID -continue Percocet 5 mg BID PRN pain -given extra 53m percocet (divded) again today  -biotene PRN dry mouth  -Eucerin tramcinolone cream given for itching on back, arm  #L arm wound -placed vascular surgery consult and Dr. SDelana Meyerto come see today -wound consult - cannot come until Monday. S/p evacuation of hematoma on 11/21, exploration of left upper arm on 11/23 with ligation of his fistula and placement of VAC and exploration of left arm on 11/25 with ligation of brachiocephalic fistula. Required 11 units of PRBC. Stable now. -vascular surgery recommendations:  Left arm wound care hydrogel to wound base, saline wet guaze, Xeroform non stick guaze to  surrounding superficial skin wounds and cover with dry guaze Kerlex and ace mild compression. Wet guaze with saline prior to removing  dressing for daily changes. We do not want to cause bleeding.  Outpatient wound care  Follow up in 2 weeks  #Atrial flutter -continue Plavix  #COPD -Albuterol inhaler  #Insomnia -Ambien 10 mg nightly  #Constipation -Senna nightly  #Smoking cessation -Nicotine patch available  #Disposition -discharge to home with home wound care -follow up with regular providers    Observation Level/Precautions:  15 minute checks  Laboratory:  CBC Chemistry Profile UDS UA  Psychotherapy:    Medications:    Consultations:    Discharge Concerns:    Estimated LOS:  Other:       I certify that inpatient services furnished can reasonably be expected to improve the patient's condition.     Jolene Schimke, MD 06/12/2017, 10:41 AM

## 2017-06-12 NOTE — H&P (Signed)
[name] NAME: [name]    MR#:  [id number]  DATE OF BIRTH:  1960-09-07  DATE OF ADMISSION:  06/12/2017  PRIMARY CARE PHYSICIAN: [name], MD   REQUESTING/REFERRING PHYSICIAN: dr. Delana Meyer  CHIEF COMPLAINT:   Left arm cellulitis HISTORY OF PRESENT ILLNESS:  [name]  is a 56 y.o. male with a known history of end-stage renal disease on hemodialysis on Monday, Wednesday and Friday, COPD, essential hypertension, depression was seen by vascular surgery while patient was in behavioral health unit for depression regarding infection of the left upper extremity. Patient was seen by vascular surgery who has requested a hospitalist to admit the patient for possible surgery. Patient reports pain is manageable but reluctant to open the dressing, and refused. Reports that chronically he short of breath and chronically on 3-4 L of oxygen at home. Denies any fever Psychiatrist has discharged the patient from their service  PAST MEDICAL HISTORY:   Past Medical History:  Diagnosis Date  . Adenomatous colon polyp    tubular  . Anemia   . Anxiety   . Arthritis    left shoulder  . Atherosclerosis of aorta ([place])   . Cardiomegaly   . Chest pain    DATE UNKNOWN, C/O PERIODICALLY  . Chronic kidney disease    dialysis - M/W/F  . Cocaine abuse ([place])   . COPD exacerbation ([place]) 08/17/2016  . Dialysis patient ([place])    Monday-Wednesday-Friday  . GERD (gastroesophageal reflux disease)    DATE UNKNOWN  . Hemorrhoids   . Hepatitis B, chronic ([place])   . Hepatitis C   . Hyperkalemia   . Hypertension   . Metabolic bone disease    Patient denies  . Nephrolithiasis   . Pneumonia   . Pulmonary edema   . Renal disorder   . Renal insufficiency   . Shortness of breath dyspnea    " for the last past year with this dialysis"  . Tubular adenoma of colon     PAST SURGICAL HISTOIRY:   Past Surgical History:  Procedure  Laterality Date  . A/V FISTULAGRAM Left 05/26/2017   Procedure: A/V FISTULAGRAM;  Surgeon: Conrad Artesia, MD;  Location: [facility];  Service: Cardiovascular;  Laterality: Left;  . AV FISTULA PLACEMENT  2012   BELIEVED WAS PLACED IN JUNE  . COLONOSCOPY    . CORONARY STENT INTERVENTION N/A 02/22/2017   Procedure: CORONARY STENT INTERVENTION;  Surgeon: Nigel Mormon, MD;  Location: [facility];  Service: Cardiovascular;  Laterality: N/A;  . HEMORRHOID BANDING    . I&D EXTREMITY Left 06/01/2017   Procedure: IRRIGATION AND DEBRIDEMENT LEFT ARM HEMATOMA WITH LIGATION OF LEFT ARM AV FISTULA;  Surgeon: Elam Dutch, MD;  Location: [facility];  Service: Vascular;  Laterality: Left;  . INSERTION OF DIALYSIS CATHETER  05/30/2017  . INSERTION OF DIALYSIS CATHETER N/A 05/30/2017   Procedure: INSERTION OF DIALYSIS CATHETER;  Surgeon: Elam Dutch, MD;  Location: [facility];  Service: Vascular;  Laterality: N/A;  . LEFT HEART CATH AND CORONARY ANGIOGRAPHY N/A 02/22/2017   Procedure: LEFT HEART CATH AND CORONARY ANGIOGRAPHY;  Surgeon: Nigel Mormon, MD;  Location: [facility];  Service: Cardiovascular;  Laterality: N/A;  . LIGATION OF ARTERIOVENOUS  FISTULA Left 12/18/6293   Procedure: Plication of Left Arm Arteriovenous Fistula;  Surgeon: Elam Dutch, MD;  Location: [facility];  Service: Vascular;  Laterality: Left;  .  POLYPECTOMY    . REVISON OF ARTERIOVENOUS FISTULA Left 10/18/8117   Procedure: PLICATION OF DISTAL ANEURYSMAL SEGEMENT OF LEFT UPPER ARM ARTERIOVENOUS FISTULA;  Surgeon: Elam Dutch, MD;  Location: [facility];  Service: Vascular;  Laterality: Left;  . REVISON OF ARTERIOVENOUS FISTULA Left 1/47/8295   Procedure: Plication of Left Upper Arm Fistula ;  Surgeon: Waynetta Sandy, MD;  Location: [facility];  Service: Vascular;  Laterality: Left;  . THROMBECTOMY W/ EMBOLECTOMY Left 06/05/2017   Procedure: EXPLORATION OF LEFT ARM FOR BLEEDING; OVERSEWED PROXIMAL  FISTULA;  Surgeon: Angelia Mould, MD;  Location: [facility];  Service: Vascular;  Laterality: Left;  . WOUND EXPLORATION Left 06/03/2017   Procedure: WOUND EXPLORATION WITH WOUND VAC APPLICATION TO LEFT ARM;  Surgeon: Angelia Mould, MD;  Location: [facility];  Service: Vascular;  Laterality: Left;    SOCIAL HISTORY:   Social History   Tobacco Use  . Smoking status: Current Every Day Smoker    Packs/day: 0.50    Years: 43.00    Pack years: 21.50    Types: Cigarettes    Start date: 08/13/1973  . Smokeless tobacco: Never Used  . Tobacco comment: Less than 1/2 pk per day  Substance Use Topics  . Alcohol use: Yes    Alcohol/week: 2.4 oz    Types: 1 Glasses of wine, 1 Cans of beer, 1 Shots of liquor, 1 Standard drinks or equivalent per week    FAMILY HISTORY:   Family History  Problem Relation Age of Onset  . Heart disease Mother   . Lung cancer Mother   . Heart disease Father   . Malignant hyperthermia Father   . COPD Father   . Throat cancer Sister   . Esophageal cancer Sister   . Hypertension Other   . COPD Other   . Colon cancer Neg Hx   . Colon polyps Neg Hx   . Rectal cancer Neg Hx   . Stomach cancer Neg Hx     DRUG ALLERGIES:   Allergies  Allergen Reactions  . Aspirin Other (See Comments)    STOMACH PAIN  . Clonidine Derivatives Itching  . Tramadol Itching    REVIEW OF SYSTEMS:  CONSTITUTIONAL: No fever, fatigue or weakness.  EYES: No blurred or double vision.  EARS, NOSE, AND THROAT: No tinnitus or ear pain.  RESPIRATORY: No cough, shortness of breath, wheezing or hemoptysis.  CARDIOVASCULAR: No chest pain, orthopnea, edema.  GASTROINTESTINAL: No nausea, vomiting, diarrhea or abdominal pain.  GENITOURINARY: No dysuria, hematuria.  ENDOCRINE: No polyuria, nocturia,  HEMATOLOGY: No anemia, easy bruising or bleeding SKIN: No rash or lesion. MUSCULOSKELETAL: left upper extremities is swollen and painful , left upper arm in clean dressing and patient  refused to open the dressing   NEUROLOGIC: No tingling, numbness, weakness.  PSYCHIATRY: No anxiety or depression.   MEDICATIONS AT HOME:   Prior to Admission medications   Medication Sig Start Date End Date Taking? Authorizing Provider  acetaminophen (TYLENOL) 500 MG tablet Take 500-1,000 mg every 6 (six) hours as needed by mouth for headache (pain).     [provider]  albuterol (PROVENTIL HFA;VENTOLIN HFA) 108 (90 Base) MCG/ACT inhaler Inhale 2 puffs into the lungs every 6 (six) hours as needed for wheezing or shortness of breath. 03/10/17   Javier Glazier, MD  calcitRIOL (ROCALTROL) 0.25 MCG capsule Take 3 capsules (0.75 mcg total) by mouth every Monday, Wednesday, and Friday with hemodialysis. 03/28/17   Cherene Altes, MD  clopidogrel (PLAVIX) 75 MG tablet Take one tablet (75mg ) daily. 04/07/17   [provider]  gabapentin (NEURONTIN) 100 MG capsule Take 100 mg 2 (two) times daily by mouth.    [provider]  minoxidil (LONITEN) 10 MG tablet Take 10 mg 2 (two) times daily as needed by mouth (when blood pressure cuff reads "above normal").  05/02/17   [provider]  nicotine (NICODERM CQ - DOSED IN MG/24 HOURS) 14 mg/24hr patch Place 14 mg daily onto the skin. 03/30/17   [provider]  ondansetron (ZOFRAN) 4 MG tablet Take 4 mg every 8 (eight) hours as needed by mouth for nausea or vomiting.  04/27/17   [provider]  oxycodone (OXY-IR) 5 MG capsule Take 5 mg 2 (two) times daily as needed by mouth for pain.    [provider]  senna (SENOKOT) 8.6 MG TABS tablet Take 1 tablet (8.6 mg total) by mouth daily as needed for mild constipation. 06/10/17   Mariel Aloe, MD  sevelamer carbonate (RENVELA) 800 MG tablet Take 4 tablets (3,200 mg total) by mouth 3 (three) times daily with meals. 06/10/17   Mariel Aloe, MD  Spacer/Aero-Holding Chambers (AEROCHAMBER MV) inhaler Use as instructed 03/10/17   Javier Glazier,  MD  zolpidem (AMBIEN) 10 MG tablet Take 1 tablet (10 mg total) by mouth at bedtime as needed for sleep. 06/10/17   Mariel Aloe, MD      VITAL SIGNS:  There were no vitals taken for this visit.  PHYSICAL EXAMINATION:  GENERAL:  56 y.o.-year-old patient lying in the bed with no acute distress.  EYES: Pupils equal, round, reactive to light and accommodation. No scleral icterus. Extraocular muscles intact.  HEENT: Head atraumatic, normocephalic. Oropharynx and nasopharynx clear.  NECK:  Supple, no jugular venous distention. No thyroid enlargement, no tenderness.  LUNGS: Normal breath sounds bilaterally, no wheezing, rales,rhonchi or crepitation. No use of accessory muscles of respiration.  CARDIOVASCULAR: S1, S2 normal. No murmurs, rubs, or gallops.  ABDOMEN: Soft, nontender, nondistended. Bowel sounds present. No organomegaly or mass.  EXTREMITIES: LUE -erythematous, edematous, tender , as per nurse's report the fistula looks infected and necrotic, in clean dressing patient refused to let me open the dressing .No pedal edema, cyanosis, or clubbing.  NEUROLOGIC: Cranial nerves II through XII are intact. Muscle strength 5/5 in all extremities. Sensation intact. Gait not checked.  PSYCHIATRIC: The patient is alert and oriented x 3.  SKIN: No obvious rash, lesion, or ulcer.   LABORATORY PANEL:   CBC Recent Labs  Lab 06/10/17 0651  WBC 10.3  HGB 8.2*  HCT 24.8*  PLT 112*   ------------------------------------------------------------------------------------------------------------------  Chemistries  Recent Labs  Lab 06/10/17 1248  NA 131*  K 4.2  CL 93*  CO2 29  GLUCOSE 119*  BUN 17  CREATININE 4.20*  CALCIUM 8.2*   ------------------------------------------------------------------------------------------------------------------  Cardiac Enzymes No results for input(s): TROPONINI in the last 168  hours. ------------------------------------------------------------------------------------------------------------------  RADIOLOGY:  No results found.  EKG:   Orders placed or performed during the hospital encounter of 05/29/17  . EKG 12-Lead  . EKG 12-Lead  . ED EKG  . ED EKG    IMPRESSION AND PLAN:   [name]  is a 56 y.o. male with a known history of end-stage renal disease on hemodialysis on Monday, Wednesday and Friday, COPD, essential hypertension, depression was seen by vascular surgery while patient was in behavioral health unit for depression regarding infection of the left upper extremity.  Patient was seen by vascular surgery who has requested a hospitalist to admit the patient for possible surgery  # LUE cellulitis and some necrosis Admit to MedSurg unit IV antibiotics Zosyn and vancomycin Vascular surgery consulted-discussed with Dr. Delana Meyer Plavix is on hold as surgery is anticipated  #Chronic hypoxic respiratory failure  2/2Chronic history of COPD Bronchodilator therapy as needed No exacerbation Continue oxygen via nasal cannula  #End-stage renal disease Continue hemodialysis on Monday Wednesday and Friday. Nephrology consult placed  #Essential hypertension Blood pressure is stable at this time and not on any antihypertensives at this time. Continue close monitoring  #Tobacco abuse disorder Consultation to quit smoking provide nicotine patch.   DVT prophylaxis with Lovenox   All the records are reviewed and case discussed with ED provider. Management plans discussed with the patient, family and they are in agreement.  CODE STATUS: fc   TOTAL TIME TAKING CARE OF THIS PATIENT: 43 minutes.   Note: This dictation was prepared with Dragon dictation along with smaller phrase technology. Any transcriptional errors that result from this process are unintentional.  Nicholes Mango M.D on 06/12/2017 at 2:29 PM  Between 7am to 6pm - Pager -  [phone]  After 6pm go to [email or web address] - password [email or web address] [facility]  [facility]  Office  [phone]  CC: Primary care physician; [name], MD

## 2017-06-12 NOTE — Progress Notes (Signed)
D: Patient denies SI/HI/AVH. Patient presents as agitated, angry, irritable and demanding during assessment. Patient isolates self to room. Patient states "I am in a prison hospital with all of these crazy people, this ain't right. I just need to get this healed and I am out of here."  Patient requests multiple snacks throughout the night. Patient demands multiple pain medications, patient states "Just give me more than 10mg , we can close the door no one will know." Patient instructed that only ordered medications will be given and to discuss medications with provider.   Patient reports pain to left arm AV fistula, patient states 8/10. Patient has been noted chewing on fingers. Patient has multiple open, bleeding wounds to fingers. Patient noted to removing bandages, and touching wounds. Patient has bilateral edema +1 and +3 pitting edema in Left arm. Multiple times this shift patient complains of dry and itchy skin.   Copious amount of drainage to dressing. Patient dressing changed per order. Wound is covered with Telfa non-adhesive bandages, wrapped in kerlix and Ace bandage wrapped over bandage.    A: Patient was assessed by this nurse.  Patient received scheduled medications. Q x 15 minute observation checks were completed for safety, patient on high fall risk safety precautions. Patient was provided with verbal education on provided medications. Patient care plan was reviewed. Patient was offered support and encouragement. Patient was encourage to attend groups, participate in unit activities and continue with plan of care.   R: Patient adheres with scheduled medication. Patient responded well to PRN medications and symptoms were relieved. Patient is receptive to treatment and safety maintained on unit. Patient had an estimated 3 hours of sleep.

## 2017-06-12 NOTE — Progress Notes (Signed)
Spoke with Dr. Jannifer Franklin about patient request for something for itching. New orders to be put in . Terrial Rhodes

## 2017-06-12 NOTE — Progress Notes (Signed)
D: Patient is noted to have removed ace wrap from wound dressing and is placing hand under Kerlix dressing stating "it's tight." Patient was educated to not remove dressing and not to touch wound multiple times this shift. Patient is also noted to have removed bandages from finger tips.   Patient stated "I am not leaving this room, this is a prison with crazy people in it, I see those crazy people out there and I do not feel safe."  A: Patient instructed to not remove bandage. Patient instructed to alert nursing staff of complaints before taking action. Dressing was secured with paper tape and next shift made aware of issues.   R: Patient refused to acknowledge education. Patient stated "It's too tight, your gonna come in here and its going to be off."

## 2017-06-13 ENCOUNTER — Encounter: Payer: Self-pay | Admitting: Radiology

## 2017-06-13 ENCOUNTER — Inpatient Hospital Stay: Payer: Medicare Other

## 2017-06-13 ENCOUNTER — Telehealth (INDEPENDENT_AMBULATORY_CARE_PROVIDER_SITE_OTHER): Payer: Self-pay | Admitting: Vascular Surgery

## 2017-06-13 DIAGNOSIS — R600 Localized edema: Secondary | ICD-10-CM

## 2017-06-13 DIAGNOSIS — N186 End stage renal disease: Secondary | ICD-10-CM

## 2017-06-13 DIAGNOSIS — M79602 Pain in left arm: Secondary | ICD-10-CM

## 2017-06-13 DIAGNOSIS — F4323 Adjustment disorder with mixed anxiety and depressed mood: Secondary | ICD-10-CM

## 2017-06-13 DIAGNOSIS — F609 Personality disorder, unspecified: Secondary | ICD-10-CM

## 2017-06-13 LAB — BASIC METABOLIC PANEL
Anion gap: 16 — ABNORMAL HIGH (ref 5–15)
BUN: 47 mg/dL — ABNORMAL HIGH (ref 6–20)
CO2: 23 mmol/L (ref 22–32)
Calcium: 9.1 mg/dL (ref 8.9–10.3)
Chloride: 87 mmol/L — ABNORMAL LOW (ref 101–111)
Creatinine, Ser: 8 mg/dL — ABNORMAL HIGH (ref 0.61–1.24)
GFR calc Af Amer: 8 mL/min — ABNORMAL LOW (ref >60)
GFR calc non Af Amer: 7 mL/min — ABNORMAL LOW (ref >60)
Glucose, Bld: 101 mg/dL — ABNORMAL HIGH (ref 65–99)
Potassium: 6.5 mmol/L (ref 3.5–5.1)
Sodium: 126 mmol/L — ABNORMAL LOW (ref 135–145)

## 2017-06-13 LAB — CBC
HCT: 27.2 % — ABNORMAL LOW (ref 40.0–52.0)
Hemoglobin: 9 g/dL — ABNORMAL LOW (ref 13.0–18.0)
MCH: 28.6 pg (ref 26.0–34.0)
MCHC: 33.1 g/dL (ref 32.0–36.0)
MCV: 86.4 fL (ref 80.0–100.0)
Platelets: 148 10*3/uL — ABNORMAL LOW (ref 150–440)
RBC: 3.15 MIL/uL — ABNORMAL LOW (ref 4.40–5.90)
RDW: 18.9 % — ABNORMAL HIGH (ref 11.5–14.5)
WBC: 10.4 10*3/uL (ref 3.8–10.6)

## 2017-06-13 LAB — POTASSIUM: Potassium: 6.2 mmol/L — ABNORMAL HIGH (ref 3.5–5.1)

## 2017-06-13 MED ORDER — PIPERACILLIN-TAZOBACTAM 3.375 G IVPB
3.3750 g | Freq: Two times a day (BID) | INTRAVENOUS | Status: DC
  Administered 2017-06-13 – 2017-06-16 (×6): 3.375 g via INTRAVENOUS
  Filled 2017-06-13 (×6): qty 50

## 2017-06-13 MED ORDER — IOPAMIDOL (ISOVUE-300) INJECTION 61%
100.0000 mL | Freq: Once | INTRAVENOUS | Status: AC | PRN
  Administered 2017-06-13: 100 mL via INTRAVENOUS

## 2017-06-13 MED ORDER — VANCOMYCIN HCL 10 G IV SOLR
1500.0000 mg | INTRAVENOUS | Status: AC
  Administered 2017-06-13: 1500 mg via INTRAVENOUS
  Filled 2017-06-13: qty 1500

## 2017-06-13 MED ORDER — VANCOMYCIN HCL IN DEXTROSE 750-5 MG/150ML-% IV SOLN
750.0000 mg | INTRAVENOUS | Status: DC
  Administered 2017-06-15: 750 mg via INTRAVENOUS
  Filled 2017-06-13: qty 150

## 2017-06-13 NOTE — Progress Notes (Signed)
HD [facility]

## 2017-06-13 NOTE — Progress Notes (Signed)
PRE HD [place]

## 2017-06-13 NOTE — Consult Note (Signed)
Pharmacy Antibiotic Note  Cassidy Tabet is a 56 y.o. male admitted on 06/12/2017 with possible abscess/non-viable skin edges of dialysis access.  Pharmacy has been consulted for vancomycin and zosyn dosing. Pt is ESRD pt on HD MWF  Plan: Vancomycin 1500mg  once. I spoke with RN Josh who will try to hang dose before patient does to HD this afternoon. He will then need a supplemental dose of 750mg  after every dialysis session Per HD goal 15-25 Zosyn 3.375g q 12 hr  Weight: 179 lb 1.6 oz (81.2 kg)  Temp (24hrs), Avg:97.8 F (36.6 C), Min:97.5 F (36.4 C), Max:98.2 F (36.8 C)  Recent Labs  Lab 06/08/17 0650  06/09/17 0636 06/10/17 0651 06/10/17 1248 06/12/17 1504 06/13/17 0333  WBC 12.0*  --  10.3 10.3  --  10.3 10.4  CREATININE  --    < > 6.32* 7.99* 4.20* 7.63* 8.00*   < > = values in this interval not displayed.    Estimated Creatinine Clearance: 10.4 mL/min (A) (by C-G formula based on SCr of 8 mg/dL (H)).    Allergies  Allergen Reactions  . Aspirin Other (See Comments)    STOMACH PAIN  . Clonidine Derivatives Itching  . Tramadol Itching    Antimicrobials this admission: vanc 12/3 >>  zosyn 12/3 >>   Dose adjustments this admission:   Microbiology results:   Thank you for allowing pharmacy to be a part of this patient's care.  Ramond Dial, Pharm.D, BCPS Clinical Pharmacist  06/13/2017 12:15 PM

## 2017-06-13 NOTE — Consult Note (Signed)
[facility] Consult   Reason for Consult: Follow-up consultation for 56 year old man with a history of dialysis and complications thereof.  This is a follow-up on concerns about suicidality Referring Physician:  Bridgett Larsson Patient Identification: [name] MRN:  [id number] Principal Diagnosis: Adjustment disorder with mixed anxiety and depressed mood Diagnosis:   Patient Active Problem List   Diagnosis Date Noted  . Personality disorder ([place]) [F60.9] 06/13/2017  . Cellulitis [L03.90] 06/12/2017  . Adjustment disorder with mixed anxiety and depressed mood [F43.23] 06/10/2017  . Suicidal ideation [R45.851] 06/10/2017  . Arm wound, left, sequela [S41.102S] 06/10/2017  . Dyspnea [R06.00] 05/29/2017  . Tachycardia [R00.0] 05/29/2017  . Hyperkalemia [E87.5] 05/22/2017  . Acute metabolic encephalopathy [C16.60]   . Anemia [D64.9] 04/23/2017  . Ascites [R18.8] 04/23/2017  . COPD (chronic obstructive pulmonary disease) ([place]) [J44.9] 04/23/2017  . Acute on chronic respiratory failure with hypoxia ([place]) [J96.21] 03/25/2017  . Atrial flutter ([place]) [I48.92] 03/25/2017  . Arrhythmia [I49.9] 03/25/2017  . COPD GOLD II/ still smoking [J44.9] 09/27/2016  . Essential hypertension [I10] 09/27/2016  . Fluid overload [E87.70] 08/30/2016  . COPD exacerbation ([place]) [J44.1] 08/17/2016  . Hypertensive urgency [I16.0] 08/17/2016  . Respiratory failure ([place]) [J96.90] 08/17/2016  . Problem with dialysis access [facility]) [T82.898A] 07/23/2016  . End stage renal disease ([place]) [N18.6] 12/02/2015  . Chronic hepatitis B ([place]) [B18.1] 03/05/2014  . Chronic hepatitis C without hepatic coma ([place]) [B18.2] 03/05/2014  . Internal hemorrhoids with bleeding, swelling and itching [K64.8] 03/05/2014  . Thrombocytopenia ([place]) [D69.6] 03/05/2014  . Chest pain [R07.9] 02/27/2014  . Alcohol abuse [F10.10] 04/14/2009  . Tobacco use disorder [F17.200] 04/14/2009  . GANGLION CYST [M67.40] 04/14/2009     Total Time spent with patient: 45 minutes  Subjective:   [name] is a 56 y.o. male patient admitted with "I do not need a damn psychiatrist.  Turn that TV back on.".  HPI: This is a follow-up note for this 56 year old gentleman who was transferred to [facility] over the weekend because of expressed suicidal ideation with he was seen in [place] and the patient's ongoing need for dialysis.  He was admitted to the psychiatric service briefly but medically decompensated and was transferred then to medical service.  He has been seen by Dr. Mamie Nick and by Dr. Vito Berger over the weekend.  Apparently judging from the chart the patient made some comments to a chaplain while he was in [place] about being suicidal.  Subsequently he has denied any suicidal intention or plan.  I did manage to have at least a brief interview with the patient today before he became completely uncooperative.  He told me that the only time he had said anything about killing himself was because he wanted his sons to speak to each other.  He says his 2 adult sons have not spoken in years and he thought this would get them talking again.  This is the same explanation he has given to other providers.  Patient denies being depressed.  Absolutely denies any suicidal ideation.  Expresses a lot of positive feeling about his own life and hopes for his own well-being.  He will not answer very many specific questions but does not appear to be currently psychotic.  He appears to have been cooperative with medical treatment here in the hospital.  He has consistently denied suicidal ideation.  Social history: Patient is a long-term dialysis patient.  Sounds like he had been living pretty much by himself in [place]  in an apartment.  He does have extended family and it sounds like he has contact with them pretty regularly and has some level of enjoyable social life.  Medical history: Patient is on dialysis.  Also COPD.   Has had diagnoses of encephalopathy related to his dialysis and his medical problems in the past.  Substance abuse history: History of alcohol abuse also of cocaine abuse.  Patient will not discuss any of this with me today.  Past Psychiatric History: No history of inpatient hospitalization he denies ever having had any psychiatric treatment in the past.  He has spoken with the psychiatrist here at our hospital but has told them all the same story that there was absolutely no truth to his suicidality.  Does not appear to be on any medication for psychiatric illness.  Risk to Self: Is patient at risk for suicide?: No Risk to Others:   Prior Inpatient Therapy:   Prior Outpatient Therapy:    Past Medical History:  Past Medical History:  Diagnosis Date  . Adenomatous colon polyp    tubular  . Anemia   . Anxiety   . Arthritis    left shoulder  . Atherosclerosis of aorta ([place])   . Cardiomegaly   . Chest pain    DATE UNKNOWN, C/O PERIODICALLY  . Chronic kidney disease    dialysis - M/W/F  . Cocaine abuse ([place])   . COPD exacerbation ([place]) 08/17/2016  . Dialysis patient ([place])    Monday-Wednesday-Friday  . GERD (gastroesophageal reflux disease)    DATE UNKNOWN  . Hemorrhoids   . Hepatitis B, chronic ([place])   . Hepatitis C   . Hyperkalemia   . Hypertension   . Metabolic bone disease    Patient denies  . Nephrolithiasis   . Pneumonia   . Pulmonary edema   . Renal disorder   . Renal insufficiency   . Shortness of breath dyspnea    " for the last past year with this dialysis"  . Tubular adenoma of colon     Past Surgical History:  Procedure Laterality Date  . A/V FISTULAGRAM Left 05/26/2017   Procedure: A/V FISTULAGRAM;  Surgeon: Conrad Old Station, MD;  Location: [facility];  Service: Cardiovascular;  Laterality: Left;  . AV FISTULA PLACEMENT  2012   BELIEVED WAS PLACED IN JUNE  . COLONOSCOPY    . CORONARY STENT INTERVENTION N/A 02/22/2017   Procedure: CORONARY STENT  INTERVENTION;  Surgeon: Nigel Mormon, MD;  Location: [facility];  Service: Cardiovascular;  Laterality: N/A;  . HEMORRHOID BANDING    . I&D EXTREMITY Left 06/01/2017   Procedure: IRRIGATION AND DEBRIDEMENT LEFT ARM HEMATOMA WITH LIGATION OF LEFT ARM AV FISTULA;  Surgeon: Elam Dutch, MD;  Location: [facility];  Service: Vascular;  Laterality: Left;  . INSERTION OF DIALYSIS CATHETER  05/30/2017  . INSERTION OF DIALYSIS CATHETER N/A 05/30/2017   Procedure: INSERTION OF DIALYSIS CATHETER;  Surgeon: Elam Dutch, MD;  Location: [facility];  Service: Vascular;  Laterality: N/A;  . LEFT HEART CATH AND CORONARY ANGIOGRAPHY N/A 02/22/2017   Procedure: LEFT HEART CATH AND CORONARY ANGIOGRAPHY;  Surgeon: Nigel Mormon, MD;  Location: [facility];  Service: Cardiovascular;  Laterality: N/A;  . LIGATION OF ARTERIOVENOUS  FISTULA Left 6/0/6301   Procedure: Plication of Left Arm Arteriovenous Fistula;  Surgeon: Elam Dutch, MD;  Location: [facility];  Service: Vascular;  Laterality: Left;  . POLYPECTOMY    . REVISON  OF ARTERIOVENOUS FISTULA Left 4/48/1856   Procedure: PLICATION OF DISTAL ANEURYSMAL SEGEMENT OF LEFT UPPER ARM ARTERIOVENOUS FISTULA;  Surgeon: Elam Dutch, MD;  Location: [facility];  Service: Vascular;  Laterality: Left;  . REVISON OF ARTERIOVENOUS FISTULA Left 09/22/9700   Procedure: Plication of Left Upper Arm Fistula ;  Surgeon: Waynetta Sandy, MD;  Location: [facility];  Service: Vascular;  Laterality: Left;  . THROMBECTOMY W/ EMBOLECTOMY Left 06/05/2017   Procedure: EXPLORATION OF LEFT ARM FOR BLEEDING; OVERSEWED PROXIMAL FISTULA;  Surgeon: Angelia Mould, MD;  Location: [facility];  Service: Vascular;  Laterality: Left;  . WOUND EXPLORATION Left 06/03/2017   Procedure: WOUND EXPLORATION WITH WOUND VAC APPLICATION TO LEFT ARM;  Surgeon: Angelia Mould, MD;  Location: [facility] OR;  Service: Vascular;  Laterality: Left;   Family History:  Family History   Problem Relation Age of Onset  . Heart disease Mother   . Lung cancer Mother   . Heart disease Father   . Malignant hyperthermia Father   . COPD Father   . Throat cancer Sister   . Esophageal cancer Sister   . Hypertension Other   . COPD Other   . Colon cancer Neg Hx   . Colon polyps Neg Hx   . Rectal cancer Neg Hx   . Stomach cancer Neg Hx    Family Psychiatric  History: None known Social History:  Social History   Substance and Sexual Activity  Alcohol Use Yes  . Alcohol/week: 2.4 oz  . Types: 1 Glasses of wine, 1 Cans of beer, 1 Shots of liquor, 1 Standard drinks or equivalent per week     Social History   Substance and Sexual Activity  Drug Use Yes  . Types: Marijuana, Cocaine    Social History   Socioeconomic History  . Marital status: Single    Spouse name: None  . Number of children: 3  . Years of education: 10  . Highest education level: None  Social Needs  . Financial resource strain: None  . Food insecurity - worry: None  . Food insecurity - inability: None  . Transportation needs - medical: None  . Transportation needs - non-medical: None  Occupational History  . Occupation: Unemployed  Tobacco Use  . Smoking status: Current Every Day Smoker    Packs/day: 0.50    Years: 43.00    Pack years: 21.50    Types: Cigarettes    Start date: 08/13/1973  . Smokeless tobacco: Never Used  . Tobacco comment: Less than 1/2 pk per day  Substance and Sexual Activity  . Alcohol use: Yes    Alcohol/week: 2.4 oz    Types: 1 Glasses of wine, 1 Cans of beer, 1 Shots of liquor, 1 Standard drinks or equivalent per week  . Drug use: Yes    Types: Marijuana, Cocaine  . Sexual activity: None    Comment: "NOT LATELY" on cocaine  Other Topics Concern  . None  Social History Narrative   Lives alone   Caffeine use: Coffee-rare   Soda- daily      [facility] (03/10/17):   Originally from [facility]. Previously worked trimming trees. No pets currently. No bird or mold  exposure.    Additional Social History:    Allergies:   Allergies  Allergen Reactions  . Aspirin Other (See Comments)    STOMACH PAIN  . Clonidine Derivatives Itching  . Tramadol Itching    Labs:  Results for orders placed or performed during the  hospital encounter of 06/12/17 (from the past 48 hour(s))  CBC with Differential/Platelet     Status: Abnormal   Collection Time: 06/12/17  3:04 PM  Result Value Ref Range   WBC 10.3 3.8 - 10.6 K/uL   RBC 3.03 (L) 4.40 - 5.90 MIL/uL   Hemoglobin 8.9 (L) 13.0 - 18.0 g/dL   HCT 26.5 (L) 40.0 - 52.0 %   MCV 87.5 80.0 - 100.0 fL   MCH 29.2 26.0 - 34.0 pg   MCHC 33.4 32.0 - 36.0 g/dL   RDW 18.8 (H) 11.5 - 14.5 %   Platelets 144 (L) 150 - 440 K/uL   Neutrophils Relative % 73 %   Neutro Abs 7.5 (H) 1.4 - 6.5 K/uL   Lymphocytes Relative 12 %   Lymphs Abs 1.2 1.0 - 3.6 K/uL   Monocytes Relative 11 %   Monocytes Absolute 1.1 (H) 0.2 - 1.0 K/uL   Eosinophils Relative 2 %   Eosinophils Absolute 0.2 0 - 0.7 K/uL   Basophils Relative 2 %   Basophils Absolute 0.2 (H) 0 - 0.1 K/uL  Basic metabolic panel     Status: Abnormal   Collection Time: 06/12/17  3:04 PM  Result Value Ref Range   Sodium 126 (L) 135 - 145 mmol/L   Potassium 5.8 (H) 3.5 - 5.1 mmol/L   Chloride 88 (L) 101 - 111 mmol/L   CO2 26 22 - 32 mmol/L   Glucose, Bld 118 (H) 65 - 99 mg/dL   BUN 43 (H) 6 - 20 mg/dL   Creatinine, Ser 7.63 (H) 0.61 - 1.24 mg/dL   Calcium 8.8 (L) 8.9 - 10.3 mg/dL   GFR calc non Af Amer 7 (L) >60 mL/min   GFR calc Af Amer 8 (L) >60 mL/min    Comment: (NOTE) The eGFR has been calculated using the CKD EPI equation. This calculation has not been validated in all clinical situations. eGFR's persistently <60 mL/min signify possible Chronic Kidney Disease.    Anion gap 12 5 - 15  CBC     Status: Abnormal   Collection Time: 06/13/17  3:33 AM  Result Value Ref Range   WBC 10.4 3.8 - 10.6 K/uL   RBC 3.15 (L) 4.40 - 5.90 MIL/uL   Hemoglobin 9.0 (L)  13.0 - 18.0 g/dL   HCT 27.2 (L) 40.0 - 52.0 %   MCV 86.4 80.0 - 100.0 fL   MCH 28.6 26.0 - 34.0 pg   MCHC 33.1 32.0 - 36.0 g/dL   RDW 18.9 (H) 11.5 - 14.5 %   Platelets 148 (L) 150 - 440 K/uL  Basic metabolic panel     Status: Abnormal   Collection Time: 06/13/17  3:33 AM  Result Value Ref Range   Sodium 126 (L) 135 - 145 mmol/L   Potassium 6.5 (HH) 3.5 - 5.1 mmol/L    Comment: CRITICAL RESULT CALLED TO, READ BACK BY AND VERIFIED WITH TAMARA CONYERS AT 3825 06/13/17.PMH   Chloride 87 (L) 101 - 111 mmol/L   CO2 23 22 - 32 mmol/L   Glucose, Bld 101 (H) 65 - 99 mg/dL   BUN 47 (H) 6 - 20 mg/dL   Creatinine, Ser 8.00 (H) 0.61 - 1.24 mg/dL   Calcium 9.1 8.9 - 10.3 mg/dL   GFR calc non Af Amer 7 (L) >60 mL/min   GFR calc Af Amer 8 (L) >60 mL/min    Comment: (NOTE) The eGFR has been calculated using the CKD EPI equation. This calculation has  not been validated in all clinical situations. eGFR's persistently <60 mL/min signify possible Chronic Kidney Disease.    Anion gap 16 (H) 5 - 15  Potassium     Status: Abnormal   Collection Time: 06/13/17  5:04 AM  Result Value Ref Range   Potassium 6.2 (H) 3.5 - 5.1 mmol/L    Current Facility-Administered Medications  Medication Dose Route Frequency Provider Last Rate Last Dose  . acetaminophen (TYLENOL) tablet 500-1,000 mg  500-1,000 mg Oral Q6H PRN Gouru, Aruna, MD      . albuterol (PROVENTIL) (2.5 MG/3ML) 0.083% nebulizer solution 2.5 mg  2.5 mg Nebulization Q4H PRN Gouru, Aruna, MD   2.5 mg at 06/12/17 1808  . calcitRIOL (ROCALTROL) capsule 0.75 mcg  0.75 mcg Oral Q M,W,F-HD Gouru, Aruna, MD   0.75 mcg at 06/13/17 0958  . diphenhydrAMINE (BENADRYL) capsule 25 mg  25 mg Oral Q6H PRN Lance Coon, MD   25 mg at 06/12/17 2357  . epoetin alfa (EPOGEN,PROCRIT) injection 10,000 Units  10,000 Units Intravenous Q M,W,F-HD Kolluru, Sarath, MD      . gabapentin (NEURONTIN) capsule 100 mg  100 mg Oral BID Gouru, Aruna, MD   100 mg at 06/13/17 0955  .  heparin injection 5,000 Units  5,000 Units Subcutaneous Q8H Gouru, Illene Silver, MD   5,000 Units at 06/13/17 1409  . minoxidil (LONITEN) tablet 10 mg  10 mg Oral BID PRN Gouru, Aruna, MD      . nicotine (NICODERM CQ - dosed in mg/24 hours) patch 14 mg  14 mg Transdermal Daily Gouru, Aruna, MD   14 mg at 06/13/17 0955  . ondansetron (ZOFRAN) tablet 4 mg  4 mg Oral Q8H PRN Gouru, Aruna, MD      . oxyCODONE (Oxy IR/ROXICODONE) immediate release tablet 5 mg  5 mg Oral Q4H PRN Gouru, Aruna, MD   5 mg at 06/13/17 1201  . piperacillin-tazobactam (ZOSYN) IVPB 3.375 g  3.375 g Intravenous Q12H Ramond Dial, RPH   Stopped at 06/13/17 1708  . senna (SENOKOT) tablet 8.6 mg  1 tablet Oral Daily PRN Gouru, Aruna, MD      . sevelamer carbonate (RENVELA) tablet 3,200 mg  3,200 mg Oral TID WC Gouru, Aruna, MD   3,200 mg at 06/13/17 1201  . [START ON 06/15/2017] vancomycin (VANCOCIN) IVPB 750 mg/150 ml premix  750 mg Intravenous Q M,W,F-HD Maccia, Melissa D, RPH      . zolpidem (AMBIEN) tablet 10 mg  10 mg Oral QHS PRN Nicholes Mango, MD   10 mg at 06/12/17 2048    Musculoskeletal: Strength & Muscle Tone: decreased Gait & Station: unsteady Patient leans: N/A  Psychiatric Specialty Exam: Physical Exam  Nursing note and vitals reviewed. Constitutional: He appears well-developed and well-nourished.  HENT:  Head: Normocephalic and atraumatic.  Eyes: Conjunctivae are normal. Pupils are equal, round, and reactive to light.  Neck: Normal range of motion.  Cardiovascular: Regular rhythm and normal heart sounds.  Respiratory: Effort normal. No respiratory distress.  GI: Soft.  Musculoskeletal: Normal range of motion.  Neurological: He is alert.  Skin: Skin is warm and dry.  Psychiatric: His affect is angry. His speech is tangential. He is agitated. He is not aggressive. Thought content is paranoid. Thought content is not delusional. Cognition and memory are impaired. He expresses impulsivity. He expresses no  homicidal and no suicidal ideation.    Review of Systems  Constitutional: Negative.   HENT: Negative.   Eyes: Negative.   Respiratory: Negative.  Cardiovascular: Negative.   Gastrointestinal: Negative.   Musculoskeletal: Negative.   Skin: Negative.   Neurological: Negative.   Psychiatric/Behavioral: Negative for depression, hallucinations, memory loss, substance abuse and suicidal ideas. The patient is not nervous/anxious and does not have insomnia.     Blood pressure (!) 147/85, pulse (!) 103, temperature 98.2 F (36.8 C), temperature source Axillary, resp. rate 20, weight 81.2 kg (179 lb 1.6 oz), SpO2 100 %.Body mass index is 29.8 kg/m.  General Appearance: Casual  Eye Contact:  Minimal  Speech:  Normal Rate  Volume:  Increased  Mood:  Angry and Irritable  Affect:  Congruent  Thought Process:  Coherent  Orientation:  Full (Time, Place, and Person)  Thought Content:  Tangential  Suicidal Thoughts:  No  Homicidal Thoughts:  No  Memory:  Immediate;   Fair Recent;   Poor Remote;   Fair  Judgement:  Fair  Insight:  Fair  Psychomotor Activity:  Decreased  Concentration:  Concentration: Fair  Recall:  [employer] of Knowledge:  Fair  Language:  Fair  Akathisia:  No  Handed:  Right  AIMS (if indicated):     Assets:  Desire for Improvement Housing  ADL's:  Impaired  Cognition:  Impaired,  Mild  Sleep:        Treatment Plan Summary: Plan 56 year old man with a history of substance abuse and no other past psychiatric history.  No known history of suicide attempts.  He made suicidal comments during an interview with a chaplain at one point but has since then angrily denied them consistently.  Patient does not appear to be depressed.  Judging from my interaction with him today and from review of the past chart it sounds like he is a chronically irritable and obstreperous person who probably has some degree of underlying personality disorder.  He does not at this point meet  commitment criteria does not require inpatient psychiatric hospitalization and I am not even clear whether any kind of psychiatric medicine would benefit him.  In any case he is telling me that he does not want to see psychiatrist any further.  No longer under commitment.  I wished him well and good luck and helped him get his TV back on to a channel that he liked.  No further need for psychiatric intervention at this point.  Disposition: Patient does not meet criteria for psychiatric inpatient admission. Supportive therapy provided about ongoing stressors.  Alethia Berthold, MD 06/13/2017 5:27 PM

## 2017-06-13 NOTE — Progress Notes (Signed)
[facility]  Daily Progress Note   Subjective: Patient seen and examined in dialysis.  Patient is without complaint. His right IJ permcath seems to be working appropriately. Denies any worsening left upper extremity pain. Patient seen by psychiatry today. He is looking forward to surgery tomorrow.   CT Left Upper Extremity: No evidence of osteo.   Objective: Vitals:   06/12/17 1937 06/13/17 0530 06/13/17 1225 06/13/17 1226  BP: (!) 157/89 (!) 150/89 (!) 131/100 (!) 147/85  Pulse: (!) 112 (!) 103 (!) 103 (!) 103  Resp: 16 18 20    Temp: (!) 97.5 F (36.4 C) 98.2 F (36.8 C) 98.2 F (36.8 C)   TempSrc: Oral Oral Axillary   SpO2: 100% 100% 100% 100%  Weight:  179 lb 1.6 oz (81.2 kg)      Intake/Output Summary (Last 24 hours) at 06/13/2017 1746 Last data filed at 06/13/2017 1507 Gross per 24 hour  Intake 550 ml  Output -  Net 550 ml   Physical Exam: A&Ox3, NAD Right IJ Permcath: Intact. No signs of infection.  CV: RRR Pulmonary: CTA Bilaterally Abdomen: Soft, Nontender, Nondistended Vascular:  Left Upper Extremity: 2+radial pulse noted. No ulceration to the left hand. Wound stable. Surrounding skin with necrotic tissue. Minimal granulation tissue noted. Mild-moderate edema noted. No purulent drainage noted.    Laboratory: CBC    Component Value Date/Time   WBC 10.4 06/13/2017 0333   HGB 9.0 (L) 06/13/2017 0333   HCT 27.2 (L) 06/13/2017 0333   PLT 148 (L) 06/13/2017 0333   BMET    Component Value Date/Time   NA 126 (L) 06/13/2017 0333   K 6.2 (H) 06/13/2017 0504   CL 87 (L) 06/13/2017 0333   CO2 23 06/13/2017 0333   GLUCOSE 101 (H) 06/13/2017 0333   BUN 47 (H) 06/13/2017 0333   CREATININE 8.00 (H) 06/13/2017 0333   CREATININE 9.18 (H) 02/15/2017 1425   CALCIUM 9.1 06/13/2017 0333   CALCIUM 8.1 (L) 07/11/2011 1117   GFRNONAA 7 (L) 06/13/2017 0333   GFRNONAA 6 (L) 02/15/2017 1425   GFRAA 8 (L) 06/13/2017 0333   GFRAA 7 (L) 02/15/2017 1425    Assessment/Planning: The patient is a 56 year old male admitted from [facility] with a left upper extremity wound due to multiple procedures for dialysis creation / bleeding - stable 1) Patient is on Dr. Hal Morales scheduled for a left upper extremity debridement / exploration with VAC placement tomorrow. 2) Continue to dialyze through right IJ permcath 3) CT of left upper extremity: no evidence of osteo. 4) Left Upper Extremity Cellulitis: On IV Zosyn and Vancomycin  Marcelle Overlie PA-C 06/13/2017 5:46 PM

## 2017-06-13 NOTE — Progress Notes (Signed)
Spoke with Dr.Diamond about critical potassium level of 6.5. New order to recheck level. [name]

## 2017-06-13 NOTE — Progress Notes (Signed)
HD TX ended  

## 2017-06-13 NOTE — Progress Notes (Signed)
Patient educated on the importance of being NPO . Patient stated " I ain't  Having nothing done today because I am getting dialysis and I will have a heart attack if I do'. Patient verbalized he wasn't going to be NPO. [name]

## 2017-06-13 NOTE — Telephone Encounter (Signed)
Gave phone number to Dr. Delana Meyer for him to call him back.

## 2017-06-13 NOTE — Progress Notes (Signed)
[place] [facility] [place] NAME: [name]    MR#:  [id number]  DATE OF BIRTH:  12-Nov-1960  SUBJECTIVE:  CHIEF COMPLAINT:  No chief complaint on file.  Hungry and want to eat. REVIEW OF SYSTEMS:  Review of Systems  Constitutional: Negative for chills, fever and malaise/fatigue.  HENT: Negative for sore throat.   Eyes: Negative for blurred vision and double vision.  Respiratory: Negative for cough, hemoptysis, shortness of breath, wheezing and stridor.   Cardiovascular: Negative for chest pain, palpitations, orthopnea and leg swelling.  Gastrointestinal: Negative for abdominal pain, blood in stool, diarrhea, melena, nausea and vomiting.  Genitourinary: Negative for dysuria, flank pain and hematuria.  Musculoskeletal: Negative for back pain and joint pain.  Neurological: Negative for dizziness, sensory change, focal weakness, seizures, loss of consciousness, weakness and headaches.  Endo/Heme/Allergies: Negative for polydipsia.  Psychiatric/Behavioral: Negative for depression. The patient is not nervous/anxious.     DRUG ALLERGIES:   Allergies  Allergen Reactions  . Aspirin Other (See Comments)    STOMACH PAIN  . Clonidine Derivatives Itching  . Tramadol Itching   VITALS:  Blood pressure (!) 147/85, pulse (!) 103, temperature 98.2 F (36.8 C), temperature source Axillary, resp. rate 20, weight 179 lb 1.6 oz (81.2 kg), SpO2 100 %. PHYSICAL EXAMINATION:  Physical Exam  Constitutional: He is oriented to person, place, and time and well-developed, well-nourished, and in no distress.  HENT:  Head: Normocephalic.  Mouth/Throat: Oropharynx is clear and moist.  Eyes: Conjunctivae and EOM are normal. Pupils are equal, round, and reactive to light. No scleral icterus.  Neck: Normal range of motion. Neck supple. No JVD present. No tracheal deviation present.  Cardiovascular: Normal rate, regular rhythm and normal heart sounds. Exam reveals  no gallop.  No murmur heard. Pulmonary/Chest: Effort normal and breath sounds normal. No respiratory distress. He has no wheezes. He has no rales.  Abdominal: Soft. Bowel sounds are normal. He exhibits no distension. There is no tenderness. There is no rebound.  Musculoskeletal: Normal range of motion. He exhibits no edema or tenderness.  Left arm in dressing.  Neurological: He is alert and oriented to person, place, and time. No cranial nerve deficit.  Skin: No rash noted. No erythema.  Psychiatric: Affect normal.   LABORATORY PANEL:  Male CBC Recent Labs  Lab 06/13/17 0333  WBC 10.4  HGB 9.0*  HCT 27.2*  PLT 148*   ------------------------------------------------------------------------------------------------------------------ Chemistries  Recent Labs  Lab 06/13/17 0333 06/13/17 0504  NA 126*  --   K 6.5* 6.2*  CL 87*  --   CO2 23  --   GLUCOSE 101*  --   BUN 47*  --   CREATININE 8.00*  --   CALCIUM 9.1  --    RADIOLOGY:  Ct Humerus Left W Contrast  Result Date: 06/13/2017 CLINICAL DATA:  Dialysis patient with a history of an arteriovenous fistula and an open wound on the left upper arm. Question abscess. EXAM: CT OF THE UPPER LEFT EXTREMITY WITH CONTRAST TECHNIQUE: Multidetector CT imaging of the upper left extremity was performed according to the standard protocol following intravenous contrast administration. COMPARISON:  None. CONTRAST:  100 ml ISOVUE-300 IOPAMIDOL (ISOVUE-300) INJECTION 61% FINDINGS: Bones/Joint/Cartilage No acute or focal abnormality is identified. No periosteal reaction is seen. Ligaments Suboptimally assessed by CT. Muscles and Tendons No intramuscular fluid collection is seen. Musculature appears somewhat edematous, particularly the biceps, suggestive of infectious or inflammatory change. Soft tissues There is stranding  in subcutaneous fat diffusely consistent with cellulitis. No soft tissue gas is identified. Skin defect just superior to the elbow  correlates with the patient's history of nonhealing wound. There is partial visualization of a small left pleural effusion. Atherosclerotic vascular disease is noted. Prominent left axillary lymph nodes have clearly visible fatty hila no most consistent with reactive change. IMPRESSION: Soft tissue wound anterior aspect of the left upper arm without underlying abscess or evidence of osteomyelitis. Diffuse subcutaneous edema about the upper arm could be due to volume overload or cellulitis. Musculature of the upper arm appears somewhat edematous which could be due to infectious or inflammatory change or volume overload. Small right pleural effusion. Atherosclerosis. Electronically Signed   By: Inge Rise M.D.   On: 06/13/2017 11:46   ASSESSMENT AND PLAN:   [name]  is a 56 y.o. male with a known history of end-stage renal disease on hemodialysis on Monday, Wednesday and Friday, COPD, essential hypertension, depression was seen by vascular surgery while patient was in behavioral health unit for depression regarding infection of the left upper extremity. Patient was seen by vascular surgery who has requested a hospitalist to admit the patient for possible surgery  # LUE infection. Continue IV Zosyn and vancomycin CT the left upper arm today and procedure tomorrow per Dr. Delana Meyer Plavix is on hold as surgery is anticipated  #Chronic hypoxic respiratory failure  2/2Chronic history of COPD Bronchodilator therapy as needed Continue oxygen via nasal cannula  Hyperkalemia. HD today. F/u K.  Chronic hyponatremia.  Adjust Na during hemodialysis and follow-up sodium level.  #End-stage renal disease Continue hemodialysis on Monday Wednesday and Friday.   #Essential hypertension Not on HTN meds. Stable.  #Tobacco abuse disorder Consultation to quit smoking provide nicotine patch.  Discussed with Dr. Delana Meyer. All the records are reviewed and case discussed with Care Management/Social  Worker. Management plans discussed with the patient, family and they are in agreement.  CODE STATUS: Full Code  TOTAL TIME TAKING CARE OF THIS PATIENT: 37 minutes.   More than 50% of the time was spent in counseling/coordination of care: YES  POSSIBLE D/C IN 3 DAYS, DEPENDING ON CLINICAL CONDITION.   [name] M.D on 06/13/2017 at 3:49 PM  Between 7am to 6pm - Pager - [phone]  After 6pm go to [email or web address] - [facility]

## 2017-06-13 NOTE — Progress Notes (Signed)
Chaplain received a page to visit with pt in room 205. Chaplain provided the ministry of prayer to pt for his family and his health.    06/13/17 1020  Clinical Encounter Type  Visited With Patient  Visit Type Initial;Spiritual support  Referral From Nurse  Consult/Referral To Chaplain  Spiritual Encounters  Spiritual Needs Prayer

## 2017-06-13 NOTE — Progress Notes (Signed)
POST HD 

## 2017-06-13 NOTE — Telephone Encounter (Signed)
New Message  Suezanne Jacquet verbalized needing to speak to G. Schnier, MD in regard to order he placed in for patient.

## 2017-06-13 NOTE — Progress Notes (Signed)
[facility], [place] 06/13/17  Subjective:  Patient is doing fair, laying in the bed He had AV fistula hematoma that was evacuated on November 21 and then ligated. He was evaluated by Dr. Delana Meyer for open wound of the left arm.  The surgical site is under further evaluation and CT scan of the left arm wound was done which showed no evidence of abscess or osteomyelitis but it does show diffuse subcutaneous edema possibly from volume or cellulitis. He is currently getting vancomycin and Zosyn. Today is his dialysis day He does not complain of any shortness of breath, nausea or vomiting.   Objective:  Vital signs in last 24 hours:  Temp:  [97.5 F (36.4 C)-98.2 F (36.8 C)] 98.2 F (36.8 C) (12/03 0530) Pulse Rate:  [103-112] 103 (12/03 0530) Resp:  [16-18] 18 (12/03 0530) BP: (150-157)/(89) 150/89 (12/03 0530) SpO2:  [100 %] 100 % (12/03 0530) Weight:  [81.2 kg (179 lb 1.6 oz)] 81.2 kg (179 lb 1.6 oz) (12/03 0530)  Weight change:  Filed Weights   06/13/17 0530  Weight: 81.2 kg (179 lb 1.6 oz)    Intake/Output:    Intake/Output Summary (Last 24 hours) at 06/13/2017 1148 Last data filed at 06/12/2017 1700 Gross per 24 hour  Intake 240 ml  Output -  Net 240 ml     Physical Exam: General:  No acute distress, laying in the bed  HEENT  anicteric, moist oral mucous membranes  Neck  supple  Pulm/lungs  normal breathing effort  CVS/Heart  regular rhythm, no rub  Abdomen:   Soft, nontender  Extremities:  No peripheral edema  Neurologic:  Alert, able to answer questions  Skin:  No acute rashes  Access:  Right IJ PermCath, left upper arm AV fistula is bandaged       Basic Metabolic Panel:  Recent Labs  Lab 06/08/17 0651 06/09/17 0636 06/10/17 0651 06/10/17 1248 06/12/17 1504 06/13/17 0333 06/13/17 0504  NA 128* 129* 124* 131* 126* 126*  --   K 5.8* 4.9 5.6* 4.2 5.8* 6.5* 6.2*  CL 92* 92* 88* 93* 88* 87*  --   CO2 25 26 24 29 26 23   --    GLUCOSE 111* 111* 88 119* 118* 101*  --   BUN 46* 32* 43* 17 43* 47*  --   CREATININE 7.64* 6.32* 7.99* 4.20* 7.63* 8.00*  --   CALCIUM 9.1 7.8* 8.0* 8.2* 8.8* 9.1  --   PHOS 5.0*  --   --   --   --   --   --      CBC: Recent Labs  Lab 06/08/17 0650 06/09/17 0636 06/10/17 0651 06/12/17 1504 06/13/17 0333  WBC 12.0* 10.3 10.3 10.3 10.4  NEUTROABS  --   --   --  7.5*  --   HGB 8.4* 8.4* 8.2* 8.9* 9.0*  HCT 25.2* 25.4* 24.8* 26.5* 27.2*  MCV 89.0 90.1 88.6 87.5 86.4  PLT 93* 110* 112* 144* 148*      Lab Results  Component Value Date   HEPBSAG Confirm. indicated 02/20/2017   HEPBSAB NEGATIVE 07/11/2011   HEPBIGM Negative 02/20/2017      Microbiology:  No results found for this or any previous visit (from the past 240 hour(s)).  Coagulation Studies: No results for input(s): LABPROT, INR in the last 72 hours.  Urinalysis: No results for input(s): COLORURINE, LABSPEC, PHURINE, GLUCOSEU, HGBUR, BILIRUBINUR, KETONESUR, PROTEINUR, UROBILINOGEN, NITRITE, LEUKOCYTESUR in the last 72 hours.  Invalid input(s):  APPERANCEUR    Imaging: No results found.   Medications:    . calcitRIOL  0.75 mcg Oral Q M,W,F-HD  . epoetin (EPOGEN/PROCRIT) injection  10,000 Units Intravenous Q M,W,F-HD  . gabapentin  100 mg Oral BID  . heparin injection (subcutaneous)  5,000 Units Subcutaneous Q8H  . nicotine  14 mg Transdermal Daily  . sevelamer carbonate  3,200 mg Oral TID WC   acetaminophen, albuterol, diphenhydrAMINE, minoxidil, ondansetron, oxyCODONE, senna, zolpidem  Assessment/ Plan:  56 y.o. African-American male with end stage renal disease on hemodialysis, left AVF hematoma status post evacuation on 11/21, ligation on 11/23 and 11/25 by Dr. Oneida Alar, Hypertension, chronic hepatitis C, chronic hepatitis B, COPD, GERD, substance abuse, depression/anxiety,congestive heart failurewho was [facility]for depression.  MWF [facility]  1. End Stage Renal Disease: on hemodialysis MWF.   Complication of hemodialysis device. Status post evacuation for hematoma and now with open wound. IV antibiotics.  As per primary team and vascular surgery - Hemodialysis through [place] in the meantime  2.  Anemia of chronic kidney disease: hemoglobin 9.0. Status post 11 units PRBC from blood loss.  - EPO with HD treatment.   3. Secondary Hyperparathyroidism:  - Sevelamer with meals     LOS: 1 [name] 12/3/201811:48 AM  [place], [place]

## 2017-06-13 NOTE — Care Management Note (Addendum)
Case Management Note  Patient Details  Name: [name] MRN: [id number] Date of Birth: 09-21-1960  Subjective/Objective:    Admitted to [facility] with cellulitis of the left arm. Lives in [place]. Dr. Karleen Dolphin is listed as primary care physician. States he has seen this physician in the last year. Friend's telephone number is [phone]). Established dialysis at [facility] on [address] x 7 years. [facility]).  Nursing assistant per [facility] comes 7 days a week 2 hours a day. No home oxygen. No skilled nursing. Uses a cane and rolling walker to aid in ambulation.Sister helps with errands. States he doesn't drive. Good appetite. No falls.  Sister or friend will transport                 Action/Plan: Will continue to follow for discharge plans   Expected Discharge Date:  06/14/17               Expected Discharge Plan:     In-House Referral:     Discharge planning Services     Post Acute Care Choice:    Choice offered to:     DME Arranged:    DME Agency:     HH Arranged:    [place] Agency:     Status of Service:     If discussed at [employer], dates discussed:    Additional Comments:  Shelbie Ammons, RN MSN CCM Care Management  [phone] 06/13/2017, 10:52 AM

## 2017-06-14 ENCOUNTER — Encounter
Admission: AD | Disposition: A | Discharge status: Home Health | Payer: Self-pay | Source: Ambulatory Visit | Attending: Internal Medicine

## 2017-06-14 ENCOUNTER — Inpatient Hospital Stay: Payer: Medicare Other | Admitting: Certified Registered"

## 2017-06-14 DIAGNOSIS — S41102A Unspecified open wound of left upper arm, initial encounter: Secondary | ICD-10-CM

## 2017-06-14 HISTORY — PX: I & D EXTREMITY: SHX5045

## 2017-06-14 HISTORY — PX: APPLICATION OF WOUND VAC: SHX5189

## 2017-06-14 LAB — CBC
HCT: 26.8 % — ABNORMAL LOW (ref 40.0–52.0)
Hemoglobin: 8.8 g/dL — ABNORMAL LOW (ref 13.0–18.0)
MCH: 28.3 pg (ref 26.0–34.0)
MCHC: 32.9 g/dL (ref 32.0–36.0)
MCV: 86.2 fL (ref 80.0–100.0)
Platelets: 184 10*3/uL (ref 150–440)
RBC: 3.12 MIL/uL — ABNORMAL LOW (ref 4.40–5.90)
RDW: 19.6 % — ABNORMAL HIGH (ref 11.5–14.5)
WBC: 8.9 10*3/uL (ref 3.8–10.6)

## 2017-06-14 LAB — BASIC METABOLIC PANEL
Anion gap: 12 (ref 5–15)
BUN: 28 mg/dL — ABNORMAL HIGH (ref 6–20)
CO2: 26 mmol/L (ref 22–32)
Calcium: 9 mg/dL (ref 8.9–10.3)
Chloride: 92 mmol/L — ABNORMAL LOW (ref 101–111)
Creatinine, Ser: 5.9 mg/dL — ABNORMAL HIGH (ref 0.61–1.24)
GFR calc Af Amer: 11 mL/min — ABNORMAL LOW (ref >60)
GFR calc non Af Amer: 10 mL/min — ABNORMAL LOW (ref >60)
Glucose, Bld: 91 mg/dL (ref 65–99)
Potassium: 5.3 mmol/L — ABNORMAL HIGH (ref 3.5–5.1)
Sodium: 130 mmol/L — ABNORMAL LOW (ref 135–145)

## 2017-06-14 LAB — TYPE AND SCREEN
ABO/RH(D): O NEG
Antibody Screen: NEGATIVE

## 2017-06-14 LAB — PROTIME-INR
INR: 1.2
Prothrombin Time: 15.1 seconds (ref 11.4–15.2)

## 2017-06-14 LAB — APTT: aPTT: 39 seconds — ABNORMAL HIGH (ref 24–36)

## 2017-06-14 SURGERY — IRRIGATION AND DEBRIDEMENT EXTREMITY
Anesthesia: General | Laterality: Left

## 2017-06-14 MED ORDER — PROPOFOL 10 MG/ML IV BOLUS
INTRAVENOUS | Status: DC | PRN
  Administered 2017-06-14: 100 mg via INTRAVENOUS
  Administered 2017-06-14: 30 mg via INTRAVENOUS

## 2017-06-14 MED ORDER — SIMETHICONE 80 MG PO CHEW
80.0000 mg | CHEWABLE_TABLET | Freq: Four times a day (QID) | ORAL | Status: DC | PRN
  Administered 2017-06-14 – 2017-06-16 (×2): 80 mg via ORAL
  Filled 2017-06-14 (×4): qty 1

## 2017-06-14 MED ORDER — FENTANYL CITRATE (PF) 100 MCG/2ML IJ SOLN: 25.0000 ug | INTRAMUSCULAR | Status: DC | PRN

## 2017-06-14 MED ORDER — SODIUM CHLORIDE 0.9 % IV SOLN
INTRAVENOUS | Status: DC | PRN
  Administered 2017-06-14: 16:00:00 via INTRAVENOUS

## 2017-06-14 MED ORDER — AMLODIPINE BESYLATE 5 MG PO TABS
5.0000 mg | ORAL_TABLET | Freq: Every day | ORAL | Status: DC
  Administered 2017-06-15 – 2017-06-16 (×2): 5 mg via ORAL
  Filled 2017-06-14 (×2): qty 1

## 2017-06-14 MED ORDER — AMLODIPINE 1 MG/ML ORAL SUSPENSION: 10.0000 mg | Freq: Every day | ORAL | Status: DC

## 2017-06-14 MED ORDER — ONDANSETRON HCL 4 MG/2ML IJ SOLN
INTRAMUSCULAR | Status: DC | PRN
  Administered 2017-06-14: 4 mg via INTRAVENOUS

## 2017-06-14 MED ORDER — LIDOCAINE HCL (CARDIAC) 20 MG/ML IV SOLN
INTRAVENOUS | Status: DC | PRN
  Administered 2017-06-14: 50 mg via INTRAVENOUS

## 2017-06-14 MED ORDER — FENTANYL CITRATE (PF) 100 MCG/2ML IJ SOLN
INTRAMUSCULAR | Status: DC | PRN
  Administered 2017-06-14: 100 ug via INTRAVENOUS

## 2017-06-14 MED ORDER — CHLORHEXIDINE GLUCONATE CLOTH 2 % EX PADS
6.0000 | MEDICATED_PAD | Freq: Every day | CUTANEOUS | Status: DC
  Administered 2017-06-14 – 2017-06-17 (×3): 6 via TOPICAL

## 2017-06-14 MED ORDER — FENTANYL CITRATE (PF) 100 MCG/2ML IJ SOLN
INTRAMUSCULAR | Status: AC
  Filled 2017-06-14: qty 2

## 2017-06-14 MED ORDER — ONDANSETRON HCL 4 MG/2ML IJ SOLN
INTRAMUSCULAR | Status: AC
  Filled 2017-06-14: qty 2

## 2017-06-14 SURGICAL SUPPLY — 24 items
BNDG CMPR 75X21 PLY HI ABS (MISCELLANEOUS) ×1
BRUSH SCRUB EZ  4% CHG (MISCELLANEOUS)
BRUSH SCRUB EZ 4% CHG (MISCELLANEOUS) ×1 IMPLANT
CANISTER SUCT 1200ML W/VALVE (MISCELLANEOUS) ×2 IMPLANT
DRSG VAC ATS MED SENSATRAC (GAUZE/BANDAGES/DRESSINGS) ×1 IMPLANT
DURAPREP 26ML APPLICATOR (WOUND CARE) ×1 IMPLANT
ELECT REM PT RETURN 9FT ADLT (ELECTROSURGICAL) ×2
ELECTRODE REM PT RTRN 9FT ADLT (ELECTROSURGICAL) ×1 IMPLANT
GAUZE SPONGE 4X4 12PLY STRL (GAUZE/BANDAGES/DRESSINGS) ×2 IMPLANT
GAUZE STRETCH 2X75IN STRL (MISCELLANEOUS) ×2 IMPLANT
GLOVE SURG SYN 8.0 (GLOVE) ×2 IMPLANT
GLOVE SURG SYN 8.0 PF PI (GLOVE) ×1 IMPLANT
GOWN STRL REUS W/ TWL LRG LVL3 (GOWN DISPOSABLE) ×2 IMPLANT
GOWN STRL REUS W/ TWL XL LVL3 (GOWN DISPOSABLE) ×1 IMPLANT
GOWN STRL REUS W/TWL LRG LVL3 (GOWN DISPOSABLE) ×4
GOWN STRL REUS W/TWL XL LVL3 (GOWN DISPOSABLE) ×2
KIT RM TURNOVER STRD PROC AR (KITS) ×2 IMPLANT
LABEL OR SOLS (LABEL) ×2 IMPLANT
NS IRRIG 500ML POUR BTL (IV SOLUTION) ×2 IMPLANT
PACK EXTREMITY ARMC (MISCELLANEOUS) ×2 IMPLANT
PAD NEG PRESSURE SENSATRAC (MISCELLANEOUS) ×1 IMPLANT
STOCKINETTE 48X4 2 PLY STRL (GAUZE/BANDAGES/DRESSINGS) ×1 IMPLANT
STOCKINETTE STRL 4IN 9604848 (GAUZE/BANDAGES/DRESSINGS) ×2 IMPLANT
WND VAC CANISTER 500ML (MISCELLANEOUS) ×2 IMPLANT

## 2017-06-14 NOTE — Progress Notes (Signed)
Patient refused morning labs and patient given education. Will continue to monitor.

## 2017-06-14 NOTE — Care Management (Signed)
Per Blanchard Mane with [facility] "He was admitted to behavioral health before we could open him, but he was nursing referral for wound care. We actually have protocols from Dr. Donzetta Matters for his surgical patients."

## 2017-06-14 NOTE — Progress Notes (Signed)
[name] at [place] NAME: [name]    MR#:  [id number]  DATE OF BIRTH:  1960-12-24  SUBJECTIVE:  CHIEF COMPLAINT:  No chief complaint on file.  Hungry and want to eat. REVIEW OF SYSTEMS:  Review of Systems  Constitutional: Negative for chills, fever and malaise/fatigue.  HENT: Negative for sore throat.   Eyes: Negative for blurred vision and double vision.  Respiratory: Negative for cough, hemoptysis, shortness of breath, wheezing and stridor.   Cardiovascular: Negative for chest pain, palpitations, orthopnea and leg swelling.  Gastrointestinal: Negative for abdominal pain, blood in stool, diarrhea, melena, nausea and vomiting.  Genitourinary: Negative for dysuria, flank pain and hematuria.  Musculoskeletal: Negative for back pain and joint pain.  Neurological: Negative for dizziness, sensory change, focal weakness, seizures, loss of consciousness, weakness and headaches.  Endo/Heme/Allergies: Negative for polydipsia.  Psychiatric/Behavioral: Negative for depression. The patient is not nervous/anxious.     DRUG ALLERGIES:   Allergies  Allergen Reactions  . Aspirin Other (See Comments)    STOMACH PAIN  . Clonidine Derivatives Itching  . Tramadol Itching   VITALS:  Blood pressure (!) 156/94, pulse 84, temperature 98.2 F (36.8 C), temperature source Oral, resp. rate 18, weight 179 lb 0.2 oz (81.2 kg), SpO2 100 %. PHYSICAL EXAMINATION:  Physical Exam  Constitutional: He is oriented to person, place, and time and well-developed, well-nourished, and in no distress.  HENT:  Head: Normocephalic.  Mouth/Throat: Oropharynx is clear and moist.  Eyes: Conjunctivae and EOM are normal. Pupils are equal, round, and reactive to light. No scleral icterus.  Neck: Normal range of motion. Neck supple. No JVD present. No tracheal deviation present.  Cardiovascular: Normal rate, regular rhythm and normal heart sounds. Exam reveals no  gallop.  No murmur heard. Pulmonary/Chest: Effort normal and breath sounds normal. No respiratory distress. He has no wheezes. He has no rales.  Abdominal: Soft. Bowel sounds are normal. He exhibits no distension. There is no tenderness. There is no rebound.  Musculoskeletal: Normal range of motion. He exhibits no edema or tenderness.  Left arm in dressing.  Neurological: He is alert and oriented to person, place, and time. No cranial nerve deficit.  Skin: No rash noted. No erythema.  Psychiatric: Affect normal.   LABORATORY PANEL:  Male CBC Recent Labs  Lab 06/14/17 0936  WBC 8.9  HGB 8.8*  HCT 26.8*  PLT 184   ------------------------------------------------------------------------------------------------------------------ Chemistries  Recent Labs  Lab 06/14/17 0936  NA 130*  K 5.3*  CL 92*  CO2 26  GLUCOSE 91  BUN 28*  CREATININE 5.90*  CALCIUM 9.0   RADIOLOGY:  No results found. ASSESSMENT AND PLAN:   [name]  is a 56 y.o. male with a known history of end-stage renal disease on hemodialysis on Monday, Wednesday and Friday, COPD, essential hypertension, depression was seen by vascular surgery while patient was in behavioral health unit for depression regarding infection of the left upper extremity. Patient was seen by vascular surgery who has requested a hospitalist to admit the patient for possible surgery  # LUE infection. Continue IV Zosyn and vancomycin CT the left upper arm today and I&D today per Dr. Delana Diaz Plavix is on hold as surgery is anticipated  #Chronic hypoxic respiratory failure  2/2Chronic history of COPD Bronchodilator therapy as needed Continue oxygen via nasal cannula  Hyperkalemia. K down to 5.3. HD tomorrow per Dr. Candiss Diaz.  Chronic hyponatremia.  Adjust Na during hemodialysis and follow-up sodium  level.  #End-stage renal disease Continue hemodialysis on Monday Wednesday and Friday.  HD tomorrow per Dr. Candiss Diaz.   #Essential  hypertension, BP is elevated. Not on HTN meds. May start norvasc.  #Tobacco abuse disorder Consultation to quit smoking provide nicotine patch.  Discussed with Dr. Candiss Diaz. All the records are reviewed and case discussed with Care Management/Social Worker. Management plans discussed with the patient, family and they are in agreement.  CODE STATUS: Full Code  TOTAL TIME TAKING CARE OF THIS PATIENT: 33 minutes.   More than 50% of the time was spent in counseling/coordination of care: YES  POSSIBLE D/C IN 2 DAYS, DEPENDING ON CLINICAL CONDITION.   [name] M.D on 06/14/2017 at 2:04 PM  Between 7am to 6pm - Pager - [phone]  After 6pm go to [email or web address] - [employer] [facility]

## 2017-06-14 NOTE — Progress Notes (Signed)
[facility], [place] 06/14/17  Subjective:  Patient is doing fair, ambulatory in the room;  He had AV fistula hematoma that was evacuated on November 21 and then ligated. He was evaluated by Dr. Delana Meyer for open wound of the left arm.  CT scan of the left arm wound showed no evidence of abscess or osteomyelitis but it does show diffuse subcutaneous edema possibly from volume or cellulitis. He is currently getting vancomycin and Zosyn. He does not complain of any shortness of breath, nausea or vomiting. He states he is hungry 1500 cc removed with HD  Objective:  Vital signs in last 24 hours:  Temp:  [98.2 F (36.8 C)] 98.2 F (36.8 C) (12/03 2047) Pulse Rate:  [80-103] 84 (12/03 2047) Resp:  [18-20] 18 (12/03 2047) BP: (131-174)/(71-112) 156/94 (12/03 2047) SpO2:  [100 %] 100 % (12/03 1703) Weight:  [81.2 kg (179 lb 0.2 oz)] 81.2 kg (179 lb 0.2 oz) (12/03 1703)  Weight change: -0.039 kg (-1.4 oz) Filed Weights   06/13/17 0530 06/13/17 1703  Weight: 81.2 kg (179 lb 1.6 oz) 81.2 kg (179 lb 0.2 oz)    Intake/Output:    Intake/Output Summary (Last 24 hours) at 06/14/2017 1038 Last data filed at 06/14/2017 0841 Gross per 24 hour  Intake 720 ml  Output 1500 ml  Net -780 ml     Physical Exam: General:  No acute distress, laying in the bed  HEENT  anicteric, moist oral mucous membranes  Neck  supple, distended neck veins  Pulm/lungs  normal breathing effort  CVS/Heart  regular rhythm, no rub  Abdomen:   Soft, nontender  Extremities:  + peripheral edema  Neurologic:  Alert, able to answer questions  Skin:  No acute rashes  Access:  Right IJ PermCath, left upper arm AV fistula is bandaged       Basic Metabolic Panel:  Recent Labs  Lab 06/08/17 0651  06/10/17 0651 06/10/17 1248 06/12/17 1504 06/13/17 0333 06/13/17 0504 06/14/17 0936  NA 128*   < > 124* 131* 126* 126*  --  130*  K 5.8*   < > 5.6* 4.2 5.8* 6.5* 6.2* 5.3*  CL 92*   < > 88*  93* 88* 87*  --  92*  CO2 25   < > 24 29 26 23   --  26  GLUCOSE 111*   < > 88 119* 118* 101*  --  91  BUN 46*   < > 43* 17 43* 47*  --  28*  CREATININE 7.64*   < > 7.99* 4.20* 7.63* 8.00*  --  5.90*  CALCIUM 9.1   < > 8.0* 8.2* 8.8* 9.1  --  9.0  PHOS 5.0*  --   --   --   --   --   --   --    < > = values in this interval not displayed.     CBC: Recent Labs  Lab 06/09/17 0636 06/10/17 0651 06/12/17 1504 06/13/17 0333 06/14/17 0936  WBC 10.3 10.3 10.3 10.4 8.9  NEUTROABS  --   --  7.5*  --   --   HGB 8.4* 8.2* 8.9* 9.0* 8.8*  HCT 25.4* 24.8* 26.5* 27.2* 26.8*  MCV 90.1 88.6 87.5 86.4 86.2  PLT 110* 112* 144* 148* 184      Lab Results  Component Value Date   HEPBSAG Confirm. indicated 02/20/2017   HEPBSAB NEGATIVE 07/11/2011   HEPBIGM Negative 02/20/2017      Microbiology:  No results found for this or any previous visit (from the past 240 hour(s)).  Coagulation Studies: Recent Labs    06/14/17 0936  LABPROT 15.1  INR 1.20    Urinalysis: No results for input(s): COLORURINE, LABSPEC, PHURINE, GLUCOSEU, HGBUR, BILIRUBINUR, KETONESUR, PROTEINUR, UROBILINOGEN, NITRITE, LEUKOCYTESUR in the last 72 hours.  Invalid input(s): APPERANCEUR    Imaging: Ct Humerus Left W Contrast  Result Date: 06/13/2017 CLINICAL DATA:  Dialysis patient with a history of an arteriovenous fistula and an open wound on the left upper arm. Question abscess. EXAM: CT OF THE UPPER LEFT EXTREMITY WITH CONTRAST TECHNIQUE: Multidetector CT imaging of the upper left extremity was performed according to the standard protocol following intravenous contrast administration. COMPARISON:  None. CONTRAST:  100 ml ISOVUE-300 IOPAMIDOL (ISOVUE-300) INJECTION 61% FINDINGS: Bones/Joint/Cartilage No acute or focal abnormality is identified. No periosteal reaction is seen. Ligaments Suboptimally assessed by CT. Muscles and Tendons No intramuscular fluid collection is seen. Musculature appears somewhat edematous,  particularly the biceps, suggestive of infectious or inflammatory change. Soft tissues There is stranding in subcutaneous fat diffusely consistent with cellulitis. No soft tissue gas is identified. Skin defect just superior to the elbow correlates with the patient's history of nonhealing wound. There is partial visualization of a small left pleural effusion. Atherosclerotic vascular disease is noted. Prominent left axillary lymph nodes have clearly visible fatty hila no most consistent with reactive change. IMPRESSION: Soft tissue wound anterior aspect of the left upper arm without underlying abscess or evidence of osteomyelitis. Diffuse subcutaneous edema about the upper arm could be due to volume overload or cellulitis. Musculature of the upper arm appears somewhat edematous which could be due to infectious or inflammatory change or volume overload. Small right pleural effusion. Atherosclerosis. Electronically Signed   By: Inge Rise M.D.   On: 06/13/2017 11:46     Medications:   . piperacillin-tazobactam (ZOSYN)  IV Stopped (06/14/17 0438)  . [START ON 06/15/2017] vancomycin     . calcitRIOL  0.75 mcg Oral Q M,W,F-HD  . Chlorhexidine Gluconate Cloth  6 each Topical Q0600  . epoetin (EPOGEN/PROCRIT) injection  10,000 Units Intravenous Q M,W,F-HD  . gabapentin  100 mg Oral BID  . heparin injection (subcutaneous)  5,000 Units Subcutaneous Q8H  . nicotine  14 mg Transdermal Daily  . sevelamer carbonate  3,200 mg Oral TID WC   acetaminophen, albuterol, diphenhydrAMINE, minoxidil, ondansetron, oxyCODONE, senna, zolpidem  Assessment/ Plan:  56 y.o. African-American male with end stage renal disease on hemodialysis, left AVF hematoma status post evacuation on 11/21, ligation on 11/23 and 11/25 by Dr. Oneida Alar, Hypertension, chronic hepatitis C, chronic hepatitis B, COPD, GERD, substance abuse, depression/anxiety,congestive heart failurewho was admitted to [facility] on[facility]for  depression.  MWF [facility]  1. End Stage Renal Disease: on hemodialysis MWF.   Complication of hemodialysis device. Status post evacuation for hematoma and now with open wound. IV antibiotics.  As per primary team and vascular surgery - Surgical debridement today - Dialysis planned for tomrrow  2.  Anemia of chronic kidney disease: hemoglobin 8.8. Status post multiple units of PRBC transfusion - EPO with HD treatment.   3. Secondary Hyperparathyroidism:  - Sevelamer with meals  4. Edema - fluid removal with HD tomorrow    LOS: 2 Mahlia Fernando 12/4/201810:38 [place], [place]

## 2017-06-14 NOTE — Transfer of Care (Signed)
Immediate Anesthesia Transfer of Care Note  Patient: [name]  Procedure(s) Performed: IRRIGATION AND DEBRIDEMENT EXTREMITY (Left ) APPLICATION OF WOUND VAC (Left )  Patient Location: PACU  Anesthesia Type:General  Level of Consciousness: sedated  Airway & Oxygen Therapy: Patient Spontanous Breathing and Patient connected to face mask oxygen  Post-op Assessment: Report given to RN and Post -op Vital signs reviewed and stable  Post vital signs: Reviewed and stable  Last Vitals:  Vitals:   06/13/17 2030 06/13/17 2047  BP: (!) 156/96 (!) 156/94  Pulse: 84 84  Resp: 18 18  Temp:  36.8 C  SpO2:      Last Pain:  Vitals:   06/14/17 1353  TempSrc:   PainSc: 4       Patients Stated Pain Goal: 0 (12/82/08 1388)  Complications: No apparent anesthesia complications

## 2017-06-14 NOTE — Anesthesia Preprocedure Evaluation (Signed)
Anesthesia Evaluation  Patient identified by MRN, date of birth, ID band Patient awake    Reviewed: Allergy & Precautions, H&P , NPO status , Patient's Chart, lab work & pertinent test results, reviewed documented beta blocker date and time   Airway Mallampati: III  TM Distance: >3 FB Neck ROM: full    Dental  (+) Poor Dentition, Missing   Pulmonary neg pulmonary ROS, shortness of breath and with exertion, pneumonia, resolved, COPD,  COPD inhaler, Current Smoker,    Pulmonary exam normal        Cardiovascular Exercise Tolerance: Poor hypertension, On Medications + Peripheral Vascular Disease  negative cardio ROS Normal cardiovascular exam Rate:Normal     Neuro/Psych PSYCHIATRIC DISORDERS Anxiety Hx of cocaine abusenegative neurological ROS  negative psych ROS   GI/Hepatic negative GI ROS, Neg liver ROS, GERD  Medicated,(+) Hepatitis -, C, B  Endo/Other  negative endocrine ROS  Renal/GU ESRF and DialysisRenal diseasenegative Renal ROS  negative genitourinary   Musculoskeletal  (+) Arthritis ,   Abdominal   Peds  Hematology negative hematology ROS (+) anemia ,   Anesthesia Other Findings   Reproductive/Obstetrics negative OB ROS                             Anesthesia Physical Anesthesia Plan  ASA: IV and emergent  Anesthesia Plan: General LMA   Post-op Pain Management:    Induction:   PONV Risk Score and Plan: 2  Airway Management Planned:   Additional Equipment:   Intra-op Plan:   Post-operative Plan:   Informed Consent: I have reviewed the patients History and Physical, chart, labs and discussed the procedure including the risks, benefits and alternatives for the proposed anesthesia with the patient or authorized representative who has indicated his/her understanding and acceptance.     Plan Discussed with: CRNA  Anesthesia Plan Comments:         Anesthesia  Quick Evaluation

## 2017-06-14 NOTE — Anesthesia Procedure Notes (Signed)
Procedure Name: LMA Insertion Date/Time: 06/14/2017 4:49 PM Performed by: Nelda Marseille, CRNA Pre-anesthesia Checklist: Patient identified, Patient being monitored, Timeout performed, Emergency Drugs available and Suction available Patient Re-evaluated:Patient Re-evaluated prior to induction Oxygen Delivery Method: Circle system utilized Preoxygenation: Pre-oxygenation with 100% oxygen Induction Type: IV induction Ventilation: Mask ventilation without difficulty LMA: LMA inserted LMA Size: 4.0 Tube type: Oral Number of attempts: 1 Placement Confirmation: positive ETCO2 and breath sounds checked- equal and bilateral Tube secured with: Tape Dental Injury: Teeth and Oropharynx as per pre-operative assessment

## 2017-06-14 NOTE — Progress Notes (Signed)
Per Dr. Geryl Councilman okay no to give morning medications.

## 2017-06-14 NOTE — H&P (Signed)
[facility] History & Physical Update  The patient was interviewed and re-examined.  The patient's previous History and Physical has been reviewed and is unchanged.  There is no change in the plan of care. We plan to proceed with the scheduled procedure.  Hortencia Pilar, MD  06/14/2017, 4:16 PM

## 2017-06-14 NOTE — Anesthesia Post-op Follow-up Note (Signed)
Anesthesia QCDR form completed.        

## 2017-06-14 NOTE — Care Management (Signed)
Amanda Morris HD liaison notified of admission.  

## 2017-06-14 NOTE — Op Note (Signed)
    OPERATIVE NOTE   PROCEDURE: Excisional debridement left arm wound  PRE-OPERATIVE DIAGNOSIS: Left arm wound with necrosis of surrounding tissues  POST-OPERATIVE DIAGNOSIS: Same  SURGEON: Hortencia Pilar  ASSISTANT(S): None  ANESTHESIA: general  ESTIMATED BLOOD LOSS: 75 cc  FINDING(S): Necrotic skin subcutaneous tissues and muscle  SPECIMEN(S): Necrotic soft tissues to pathology also wound culture to microbiology  INDICATIONS:   [name] is a 56 y.o. male who presents with large wound of the left arm he was transferred in from an outside institution.  On examination there is clearly necrotic skin and soft tissue surrounding the open wound.  This requires further debridement.  DESCRIPTION: After full informed written consent was obtained from the patient, the patient was brought back to the operating room and placed supine upon the operating table.  Prior to induction, the patient received IV antibiotics.   After obtaining adequate anesthesia, the patient was then prepped and draped in the standard fashion for a debridement left arm wound.  The wound is then debrided sharply with a 15 blade scalpel as well as Metzenbaum scissors.  Necrotic skin and soft tissues and muscle tissue was debrided.  Hemostasis was then obtained with Bovie cautery as well as 3-0 Vicryl suture ligatures.  Wound culture was taken of the apex of the wound.  Wound was then irrigated.  Wound measures 14 cm x 9 cm x 1 cm in depth.  Wound VAC is then applied using a black sponge directly on the tissue.  No frank purulence was encountered.   The patient tolerated this procedure well.   COMPLICATIONS: None  CONDITION: [facility]  Office: [phone]   06/14/2017, 6:01 PM

## 2017-06-15 ENCOUNTER — Encounter: Payer: Self-pay | Admitting: Vascular Surgery

## 2017-06-15 LAB — VANCOMYCIN, RANDOM: Vancomycin Rm: 11

## 2017-06-15 MED ORDER — CLOPIDOGREL BISULFATE 75 MG PO TABS
75.0000 mg | ORAL_TABLET | Freq: Every day | ORAL | Status: DC
  Administered 2017-06-16 – 2017-06-17 (×2): 75 mg via ORAL
  Filled 2017-06-15 (×2): qty 1

## 2017-06-15 MED ORDER — OXYCODONE HCL 5 MG PO TABS
10.0000 mg | ORAL_TABLET | ORAL | Status: DC | PRN
  Administered 2017-06-15 – 2017-06-17 (×9): 10 mg via ORAL
  Filled 2017-06-15 (×9): qty 2

## 2017-06-15 MED ORDER — SODIUM CHLORIDE 0.9 % IV SOLN
500.0000 mg | Freq: Once | INTRAVENOUS | Status: AC
  Administered 2017-06-15: 500 mg via INTRAVENOUS
  Filled 2017-06-15: qty 500

## 2017-06-15 NOTE — Care Management (Signed)
Notified that patient will require wound vac at discharge.  Patient does not have a preference of agency for wound vac.  Heads up referral made to [facility] with [place].  Order form for wound vac to be placed on chart by Brenton Grills for MD to sign.

## 2017-06-15 NOTE — Progress Notes (Signed)
HD tx start 

## 2017-06-15 NOTE — Progress Notes (Signed)
[facility], [place] 06/15/17  Subjective:   He had AV fistula hematoma that was evacuated on November 21 and then ligated. He was evaluated by Dr. Delana Meyer for open wound of the left arm.  CT scan of the left arm wound showed no evidence of abscess or osteomyelitis but it does show diffuse subcutaneous edema possibly from volume or cellulitis.  Patient seen during dialysis Tolerating well   HEMODIALYSIS FLOWSHEET:  Blood Flow Rate (mL/min): 400 mL/min Arterial Pressure (mmHg): -160 mmHg Venous Pressure (mmHg): 150 mmHg Transmembrane Pressure (mmHg): 70 mmHg Ultrafiltration Rate (mL/min): 860 mL/min Dialysate Flow Rate (mL/min): 600 ml/min Conductivity: Machine : 13.7 Conductivity: Machine : 13.7 Dialysis Fluid Bolus: Normal Saline Bolus Amount (mL): 250 mL    Objective:  Vital signs in last 24 hours:  Temp:  [97.1 F (36.2 C)-98.3 F (36.8 C)] 97.6 F (36.4 C) (12/05 1615) Pulse Rate:  [82-108] 108 (12/05 1615) Resp:  [14-27] 17 (12/05 1530) BP: (107-179)/(59-110) 155/89 (12/05 1615) SpO2:  [95 %-100 %] 100 % (12/05 1615) Weight:  [91.4 kg (201 lb 8 oz)] 91.4 kg (201 lb 8 oz) (12/05 1115)  Weight change:  Filed Weights   06/13/17 0530 06/13/17 1703 06/15/17 1115  Weight: 81.2 kg (179 lb 1.6 oz) 81.2 kg (179 lb 0.2 oz) 91.4 kg (201 lb 8 oz)    Intake/Output:    Intake/Output Summary (Last 24 hours) at 06/15/2017 1633 Last data filed at 06/15/2017 1530 Gross per 24 hour  Intake 1441 ml  Output 2375 ml  Net -934 ml     Physical Exam: General:  No acute distress, laying in the bed  HEENT  anicteric, moist oral mucous membranes  Neck  supple, distended neck veins  Pulm/lungs  normal breathing effort  CVS/Heart  regular rhythm, no rub  Abdomen:   Soft, nontender  Extremities:  + peripheral edema  Neurologic:  Alert, able to answer questions  Skin:  No acute rashes  Access:  Right IJ PermCath, left upper arm AV fistula wound vac        Basic Metabolic Panel:  Recent Labs  Lab 06/10/17 0651 06/10/17 1248 06/12/17 1504 06/13/17 0333 06/13/17 0504 06/14/17 0936  NA 124* 131* 126* 126*  --  130*  K 5.6* 4.2 5.8* 6.5* 6.2* 5.3*  CL 88* 93* 88* 87*  --  92*  CO2 24 29 26 23   --  26  GLUCOSE 88 119* 118* 101*  --  91  BUN 43* 17 43* 47*  --  28*  CREATININE 7.99* 4.20* 7.63* 8.00*  --  5.90*  CALCIUM 8.0* 8.2* 8.8* 9.1  --  9.0     CBC: Recent Labs  Lab 06/09/17 0636 06/10/17 0651 06/12/17 1504 06/13/17 0333 06/14/17 0936  WBC 10.3 10.3 10.3 10.4 8.9  NEUTROABS  --   --  7.5*  --   --   HGB 8.4* 8.2* 8.9* 9.0* 8.8*  HCT 25.4* 24.8* 26.5* 27.2* 26.8*  MCV 90.1 88.6 87.5 86.4 86.2  PLT 110* 112* 144* 148* 184      Lab Results  Component Value Date   HEPBSAG Confirm. indicated 02/20/2017   HEPBSAB NEGATIVE 07/11/2011   HEPBIGM Negative 02/20/2017      Microbiology:  Recent Results (from the past 240 hour(s))  Aerobic/Anaerobic Culture (surgical/deep wound)     Status: None (Preliminary result)   Collection Time: 06/14/17  5:19 PM  Result Value Ref Range Status   Specimen Description ARM LEFT  Final  Special Requests NONE  Final   Gram Stain   Final    FEW WBC PRESENT, PREDOMINANTLY PMN NO ORGANISMS SEEN    Culture   Final    NO GROWTH < 24 HOURS Performed at [facility] [address] [place], [place] [address]    Report Status PENDING  Incomplete    Coagulation Studies: Recent Labs    06/14/17 0936  LABPROT 15.1  INR 1.20    Urinalysis: No results for input(s): COLORURINE, LABSPEC, PHURINE, GLUCOSEU, HGBUR, BILIRUBINUR, KETONESUR, PROTEINUR, UROBILINOGEN, NITRITE, LEUKOCYTESUR in the last 72 hours.  Invalid input(s): APPERANCEUR    Imaging: No results found.   Medications:   . piperacillin-tazobactam (ZOSYN)  IV 3.375 g (06/15/17 1614)  . vancomycin Stopped (06/15/17 1607)   . amLODipine  5 mg Oral Daily  . calcitRIOL  0.75 mcg Oral Q M,W,F-HD  .  Chlorhexidine Gluconate Cloth  6 each Topical Q0600  . [START ON 06/16/2017] clopidogrel  75 mg Oral Daily  . epoetin (EPOGEN/PROCRIT) injection  10,000 Units Intravenous Q M,W,F-HD  . gabapentin  100 mg Oral BID  . heparin injection (subcutaneous)  5,000 Units Subcutaneous Q8H  . nicotine  14 mg Transdermal Daily  . sevelamer carbonate  3,200 mg Oral TID WC   acetaminophen, albuterol, diphenhydrAMINE, minoxidil, ondansetron, oxyCODONE, senna, simethicone, zolpidem  Assessment/ Plan:  56 y.o. African-American male with end stage renal disease on hemodialysis, left AVF hematoma status post evacuation on 11/21, ligation on 11/23 and 11/25 by Dr. Oneida Alar, Hypertension, chronic hepatitis C, chronic hepatitis B, COPD, GERD, substance abuse, depression/anxiety,congestive heart failurewho was admitted to [facility] on11/30/2018for depression.  MWF [facility]  1. End Stage Renal Disease: on hemodialysis MWF.   Complication of hemodialysis device. Status post evacuation for hematoma and now with open wound. IV antibiotics.  As per primary team and vascular surgery - Surgical debridement-> wound vac placed Continue scheduled dialysis  2.  Anemia of chronic kidney disease: hemoglobin 8.8. Status post multiple units of PRBC transfusion - EPO with HD treatment.   3. Secondary Hyperparathyroidism:  - Sevelamer with meals  4. Edema - fluid removal with HD as tolerated    LOS: 3 Icy Fuhrmann 12/5/20184:33 PM  [facility] [place], [place]

## 2017-06-15 NOTE — Consult Note (Signed)
Pharmacy Antibiotic Note  [name] is a 56 y.o. male admitted on 06/12/2017 with possible abscess/non-viable skin edges of dialysis access.  Pharmacy has been consulted for vancomycin and zosyn dosing. Pt is ESRD pt on HD MWF  Plan:   12/5 evening: Supplemental dose of vancomycin 500mg  iv x 1 due to vancomycin random level of 11.      12/5 afternoon: Patient received their load dose of 1500, but did not get a supplemental dose after their first HD session on Monday. Pt just completed his second HD since starting vanc. 750mg  was given. I will draw a level in a few hours just to make sure pt is therapeutic and doesn't need any additional vanc tonight. Per HD goal 15-25 Continue Zosyn 3.375g q 12 hr  Height: 5\' 9"  (175.3 cm) Weight: 201 lb 8 oz (91.4 kg) IBW/kg (Calculated) : 70.7  Temp (24hrs), Avg:98.1 F (36.7 C), Min:97.6 F (36.4 C), Max:98.3 F (36.8 C)  Recent Labs  Lab 06/09/17 0636 06/10/17 0651 06/10/17 1248 06/12/17 1504 06/13/17 0333 06/14/17 0936 06/15/17 1656  WBC 10.3 10.3  --  10.3 10.4 8.9  --   CREATININE 6.32* 7.99* 4.20* 7.63* 8.00* 5.90*  --   VANCORANDOM  --   --   --   --   --   --  11    Estimated Creatinine Clearance: 16 mL/min (A) (by C-G formula based on SCr of 5.9 mg/dL (H)).    Allergies  Allergen Reactions  . Aspirin Other (See Comments)    STOMACH PAIN  . Clonidine Derivatives Itching  . Tramadol Itching    Antimicrobials this admission: vanc 12/3 >>  zosyn 12/3 >>   Dose adjustments this admission:   Microbiology results:   Thank you for allowing pharmacy to be a part of this patient's care.  Thomasenia Sales, PharmD, MBA, [facility] Clinical Pharmacist [facility]    06/15/2017 8:06 PM

## 2017-06-15 NOTE — Progress Notes (Signed)
Post HD assessment  

## 2017-06-15 NOTE — Progress Notes (Signed)
[facility]  Daily Progress Note   Subjective: 1 Day Post-Op: Excisional debridement of left upper extremity wound.  Patient complaining of left upper extremity surgical site pain.  The patient's pain medication has been increased to provide adequate relief.    Objective: Vitals:   06/15/17 1500 06/15/17 1510 06/15/17 1530 06/15/17 1615  BP: (!) 179/71 (!) 163/72 (!) 160/70 (!) 155/89  Pulse: 94 98 91 (!) 108  Resp: 20 18 17    Temp:   98.3 F (36.8 C) 97.6 F (36.4 C)  TempSrc:   Axillary Oral  SpO2: 97% 96% 97% 100%  Weight:        Intake/Output Summary (Last 24 hours) at 06/15/2017 1823 Last data filed at 06/15/2017 1530 Gross per 24 hour  Intake 1200 ml  Output 2365 ml  Net -1165 ml   Physical Exam: A&Ox3, NAD Right IJ Permcath: Intact.  No infection noted.  Working appropriately during dialysis. CV: RRR Pulmonary: CTA Bilaterally Left upper extremity: The back is intact and to suction.  Mild to moderate left upper extremity edema.  2+ radial pulses noted. Abdomen: Soft, Nontender, Nondistended   Laboratory: CBC    Component Value Date/Time   WBC 8.9 06/14/2017 0936   HGB 8.8 (L) 06/14/2017 0936   HCT 26.8 (L) 06/14/2017 0936   PLT 184 06/14/2017 0936   BMET    Component Value Date/Time   NA 130 (L) 06/14/2017 0936   K 5.3 (H) 06/14/2017 0936   CL 92 (L) 06/14/2017 0936   CO2 26 06/14/2017 0936   GLUCOSE 91 06/14/2017 0936   BUN 28 (H) 06/14/2017 0936   CREATININE 5.90 (H) 06/14/2017 0936   CREATININE 9.18 (H) 02/15/2017 1425   CALCIUM 9.0 06/14/2017 0936   CALCIUM 8.1 (L) 07/11/2011 1117   GFRNONAA 10 (L) 06/14/2017 0936   GFRNONAA 6 (L) 02/15/2017 1425   GFRAA 11 (L) 06/14/2017 0936   GFRAA 7 (L) 02/15/2017 1425   Assessment/Planning: The patient is a 56 year old male status post a left upper extremity wound debridement POD#1 - Stable 1) Will plan on first VAC change tomorrow.  Patient will need to undergo VAC changes three  times a week upon discharge.  I had a long discussion with the patient about this and he expresses his understanding.  Social work is aware and discharge planning is in process. 2) Patient to continue using right IJ PermCath for dialysis.  Will need to plan on creation of a new access. 3) Left upper extremity wound culture from the OR with no growth over 24 hours.  Patient will need to be transitioned to p.o. antibiotics  Marcelle Overlie PA-C 06/15/2017 6:23 PM

## 2017-06-15 NOTE — Consult Note (Signed)
Pharmacy Antibiotic Note  [name] is a 56 y.o. male admitted on 06/12/2017 with possible abscess/non-viable skin edges of dialysis access.  Pharmacy has been consulted for vancomycin and zosyn dosing. Pt is ESRD pt on HD MWF  Plan: Patient received their load dose of 1500, but did not get a supplemental dose after their first HD session on Monday. Pt just completed his second HD since starting vanc. 750mg  was given. I will draw a level in a few hours just to make sure pt is therapeutic and doesn't need any additional vanc tonight. Per HD goal 15-25 Continue Zosyn 3.375g q 12 hr  Weight: 201 lb 8 oz (91.4 kg)  Temp (24hrs), Avg:97.8 F (36.6 C), Min:97.1 F (36.2 C), Max:98.3 F (36.8 C)  Recent Labs  Lab 06/09/17 0636 06/10/17 0651 06/10/17 1248 06/12/17 1504 06/13/17 0333 06/14/17 0936  WBC 10.3 10.3  --  10.3 10.4 8.9  CREATININE 6.32* 7.99* 4.20* 7.63* 8.00* 5.90*    Estimated Creatinine Clearance: 14.9 mL/min (A) (by C-G formula based on SCr of 5.9 mg/dL (H)).    Allergies  Allergen Reactions  . Aspirin Other (See Comments)    STOMACH PAIN  . Clonidine Derivatives Itching  . Tramadol Itching    Antimicrobials this admission: vanc 12/3 >>  zosyn 12/3 >>   Dose adjustments this admission:   Microbiology results:   Thank you for allowing pharmacy to be a part of this patient's care.  Ramond Dial, Pharm.D, BCPS Clinical Pharmacist  06/15/2017 3:00 PM

## 2017-06-15 NOTE — Progress Notes (Signed)
Patient decline Heparin, " Don't think I need that after today.

## 2017-06-15 NOTE — Progress Notes (Signed)
Pre HD assessment  

## 2017-06-15 NOTE — Plan of Care (Signed)
Patient continues to progress with questions regarding time frames.  Patient presents with a flat response, but will verbalize appreciation of information taught

## 2017-06-15 NOTE — Progress Notes (Signed)
HD tx end  

## 2017-06-15 NOTE — Progress Notes (Addendum)
[name] at [place] NAME: [name]    MR#:  [id number]  DATE OF BIRTH:  13-Jul-1960  SUBJECTIVE:  CHIEF COMPLAINT:  No chief complaint on file.  No complaints. REVIEW OF SYSTEMS:  Review of Systems  Constitutional: Negative for chills, fever and malaise/fatigue.  HENT: Negative for sore throat.   Eyes: Negative for blurred vision and double vision.  Respiratory: Negative for cough, hemoptysis, shortness of breath, wheezing and stridor.   Cardiovascular: Negative for chest pain, palpitations, orthopnea and leg swelling.  Gastrointestinal: Negative for abdominal pain, blood in stool, diarrhea, melena, nausea and vomiting.  Genitourinary: Negative for dysuria, flank pain and hematuria.  Musculoskeletal: Negative for back pain and joint pain.  Neurological: Negative for dizziness, sensory change, focal weakness, seizures, loss of consciousness, weakness and headaches.  Endo/Heme/Allergies: Negative for polydipsia.  Psychiatric/Behavioral: Negative for depression. The patient is not nervous/anxious.     DRUG ALLERGIES:   Allergies  Allergen Reactions  . Aspirin Other (See Comments)    STOMACH PAIN  . Clonidine Derivatives Itching  . Tramadol Itching   VITALS:  Blood pressure (!) 163/72, pulse 98, temperature 98.1 F (36.7 C), temperature source Axillary, resp. rate 18, weight 201 lb 8 oz (91.4 kg), SpO2 96 %. PHYSICAL EXAMINATION:  Physical Exam  Constitutional: He is oriented to person, place, and time and well-developed, well-nourished, and in no distress.  HENT:  Head: Normocephalic.  Mouth/Throat: Oropharynx is clear and moist.  Eyes: Conjunctivae and EOM are normal. Pupils are equal, round, and reactive to light. No scleral icterus.  Neck: Normal range of motion. Neck supple. No JVD present. No tracheal deviation present.  Cardiovascular: Normal rate, regular rhythm and normal heart sounds. Exam reveals no gallop.  No  murmur heard. Pulmonary/Chest: Effort normal and breath sounds normal. No respiratory distress. He has no wheezes. He has no rales.  Abdominal: Soft. Bowel sounds are normal. He exhibits no distension. There is no tenderness. There is no rebound.  Musculoskeletal: Normal range of motion. He exhibits no edema or tenderness.  Left arm wound in dressing with wound VAC, draining bloody fluid.  Neurological: He is alert and oriented to person, place, and time. No cranial nerve deficit.  Skin: No rash noted. No erythema.  Psychiatric: Affect normal.   LABORATORY PANEL:  Male CBC Recent Labs  Lab 06/14/17 0936  WBC 8.9  HGB 8.8*  HCT 26.8*  PLT 184   ------------------------------------------------------------------------------------------------------------------ Chemistries  Recent Labs  Lab 06/14/17 0936  NA 130*  K 5.3*  CL 92*  CO2 26  GLUCOSE 91  BUN 28*  CREATININE 5.90*  CALCIUM 9.0   RADIOLOGY:  No results found. ASSESSMENT AND PLAN:   [name]  is a 56 y.o. male with a known history of end-stage renal disease on hemodialysis on Monday, Wednesday and Friday, COPD, essential hypertension, depression was seen by vascular surgery while patient was in behavioral health unit for depression regarding infection of the left upper extremity. Patient was seen by vascular surgery who has requested a hospitalist to admit the patient for possible surgery  # LUE infection. Continue IV Zosyn and vancomycin S/P left upper arm I&D today by Dr. Delana Meyer, wound care and VAC.  Follow-up wound culture. Plavix is on hold as surgery is anticipated. Resume.  #Chronic hypoxic respiratory failure  2/2Chronic history of COPD Bronchodilator therapy as needed Continue oxygen via nasal cannula  Hyperkalemia. K down to 5.3. HD today per Dr. Candiss Norse. F/u  K tomorrow.  Chronic hyponatremia.  Adjust Na during hemodialysis and follow-up sodium level.  #End-stage renal disease Continue  hemodialysis on Monday Wednesday and Friday.  HD today per Dr. Candiss Norse.   #Essential hypertension, BP is elevated. Not on HTN meds.Start norvasc.  IV hydralazine as needed  #Tobacco abuse disorder Consultation to quit smoking provide nicotine patch.  Discussed with Dr. Candiss Norse. All the records are reviewed and case discussed with Care Management/Social Worker. Management plans discussed with the patient, family and they are in agreement.  CODE STATUS: Full Code  TOTAL TIME TAKING CARE OF THIS PATIENT: 33 minutes.   More than 50% of the time was spent in counseling/coordination of care: YES  POSSIBLE D/C IN 2 DAYS, DEPENDING ON CLINICAL CONDITION.   [name] M.D on 06/15/2017 at 3:37 PM  Between 7am to 6pm - Pager - [phone]  After 6pm go to [email or web address] - [facility]

## 2017-06-15 NOTE — Progress Notes (Signed)
Patient is saying that one oxycodone does not help.  He is requesting 2 (10mg ).  Dr Lucky Cowboy increased oxycodone to 10mg 

## 2017-06-16 LAB — BASIC METABOLIC PANEL
Anion gap: 10 (ref 5–15)
BUN: 19 mg/dL (ref 6–20)
CO2: 29 mmol/L (ref 22–32)
Calcium: 8.7 mg/dL — ABNORMAL LOW (ref 8.9–10.3)
Chloride: 95 mmol/L — ABNORMAL LOW (ref 101–111)
Creatinine, Ser: 4.9 mg/dL — ABNORMAL HIGH (ref 0.61–1.24)
GFR calc Af Amer: 14 mL/min — ABNORMAL LOW (ref >60)
GFR calc non Af Amer: 12 mL/min — ABNORMAL LOW (ref >60)
Glucose, Bld: 115 mg/dL — ABNORMAL HIGH (ref 65–99)
Potassium: 4.4 mmol/L (ref 3.5–5.1)
Sodium: 134 mmol/L — ABNORMAL LOW (ref 135–145)

## 2017-06-16 LAB — HEMOGLOBIN: Hemoglobin: 8.4 g/dL — ABNORMAL LOW (ref 13.0–18.0)

## 2017-06-16 LAB — SURGICAL PATHOLOGY

## 2017-06-16 MED ORDER — AMOXICILLIN-POT CLAVULANATE 500-125 MG PO TABS
1.0000 | ORAL_TABLET | Freq: Every day | ORAL | Status: DC
  Administered 2017-06-16 – 2017-06-17 (×2): 500 mg via ORAL
  Filled 2017-06-16 (×2): qty 1

## 2017-06-16 MED ORDER — AMLODIPINE BESYLATE 10 MG PO TABS
10.0000 mg | ORAL_TABLET | Freq: Every day | ORAL | Status: DC
  Administered 2017-06-17: 10 mg via ORAL
  Filled 2017-06-16: qty 1

## 2017-06-16 MED ORDER — ACETAMINOPHEN 500 MG PO TABS
500.0000 mg | ORAL_TABLET | Freq: Four times a day (QID) | ORAL | Status: DC | PRN
  Administered 2017-06-16 – 2017-06-17 (×2): 1000 mg via ORAL
  Filled 2017-06-16 (×2): qty 2

## 2017-06-16 NOTE — Progress Notes (Signed)
Pharmacy Antibiotic Note  [name] is a 56 y.o. male admitted on 06/12/2017 with possible abscess/non-viable skin edges of dialysis access.  Pharmacy has been consulted for vancomycin and zosyn dosing. Pt is ESRD pt on HD MWF  Plan: Patient received vanc loading dose of 1500 on 12/3, but did not get a supplemental dose after their first HD session on Monday. Pt completed his second HD since starting vanc on 12/5. 750mg  was given. I will draw a level in a few hours just to make sure pt is therapeutic and doesn't need any additional vanc tonight.  Random vanc level 12/5 = 11; additional 500 mg of vanc given  Will order trough for 12/10 with HD since supp dose after HD on 12/3 was missed.  Per HD goal 15-25  Continue Zosyn 3.375g IV q 12 hr EI  Height: 5\' 9"  (175.3 cm) Weight: 201 lb 8 oz (91.4 kg) IBW/kg (Calculated) : 70.7  Temp (24hrs), Avg:98 F (36.7 C), Min:97.6 F (36.4 C), Max:98.3 F (36.8 C)  Recent Labs  Lab 06/10/17 0651 06/10/17 1248 06/12/17 1504 06/13/17 0333 06/14/17 0936 06/15/17 1656 06/16/17 0431  WBC 10.3  --  10.3 10.4 8.9  --   --   CREATININE 7.99* 4.20* 7.63* 8.00* 5.90*  --  4.90*  VANCORANDOM  --   --   --   --   --  11  --     Estimated Creatinine Clearance: 19.3 mL/min (A) (by C-G formula based on SCr of 4.9 mg/dL (H)).    Allergies  Allergen Reactions  . Aspirin Other (See Comments)    STOMACH PAIN  . Clonidine Derivatives Itching  . Tramadol Itching    Antimicrobials this admission: vanc 12/3 >>  zosyn 12/3 >>   Dose adjustments this admission:   Microbiology results: WCx NGTD  Thank you for allowing pharmacy to be a part of this patient's care.  Rayna Sexton, PharmD, BCPS Clinical Pharmacist 06/16/2017 10:13 AM    06/16/2017 10:09 AM

## 2017-06-16 NOTE — Care Management (Signed)
Patient admitted with LUE infection.  Patient lives at home alone in [place]. PCP Osei Bonsu.  Referral had already been made to [facility] prior to admission.  They are aware of admission.  Patient to discharge home with a wound vac in place.   Home wound vac has been delivered to patient's room by [facility] from [place].    Notified by [facility] HD liaison that patient does not wish return to his outpatient dialysis center.  RNCM spoke with patient.  Patient states that under no circumstance will he return to his outpatient center.  Patient states "I will just give up on everything, even if it means that I die before I go back there".  Patient inquired if he could come to [facility] 3 times a week for his HD.  RNCM informed patient that this is not an option as we are not as we are an acute care center. Dr. Candiss Norse and [facility] Notified.  Estill Bamberg is working on a transfer to another center in [place].

## 2017-06-16 NOTE — Progress Notes (Signed)
[facility] [place] NAME: [name]    MR#:  [id number]  DATE OF BIRTH:  Jul 12, 1961  SUBJECTIVE:  CHIEF COMPLAINT:  No chief complaint on file.  No complaints. On wound VAC with bloody drainage. REVIEW OF SYSTEMS:  Review of Systems  Constitutional: Negative for chills, fever and malaise/fatigue.  HENT: Negative for sore throat.   Eyes: Negative for blurred vision and double vision.  Respiratory: Negative for cough, hemoptysis, shortness of breath, wheezing and stridor.   Cardiovascular: Negative for chest pain, palpitations, orthopnea and leg swelling.  Gastrointestinal: Negative for abdominal pain, blood in stool, diarrhea, melena, nausea and vomiting.  Genitourinary: Negative for dysuria, flank pain and hematuria.  Musculoskeletal: Negative for back pain and joint pain.  Neurological: Negative for dizziness, sensory change, focal weakness, seizures, loss of consciousness, weakness and headaches.  Endo/Heme/Allergies: Negative for polydipsia.  Psychiatric/Behavioral: Negative for depression. The patient is not nervous/anxious.     DRUG ALLERGIES:   Allergies  Allergen Reactions  . Aspirin Other (See Comments)    STOMACH PAIN  . Clonidine Derivatives Itching  . Tramadol Itching   VITALS:  Blood pressure (!) 160/83, pulse 72, temperature (!) 96.9 F (36.1 C), temperature source Axillary, resp. rate 20, height 5\' 9"  (1.753 m), weight 201 lb 8 oz (91.4 kg), SpO2 96 %. PHYSICAL EXAMINATION:  Physical Exam  Constitutional: He is oriented to person, place, and time and well-developed, well-nourished, and in no distress.  HENT:  Head: Normocephalic.  Mouth/Throat: Oropharynx is clear and moist.  Eyes: Conjunctivae and EOM are normal. Pupils are equal, round, and reactive to light. No scleral icterus.  Neck: Normal range of motion. Neck supple. No JVD present. No tracheal deviation present.  Cardiovascular: Normal rate, regular  rhythm and normal heart sounds. Exam reveals no gallop.  No murmur heard. Pulmonary/Chest: Effort normal and breath sounds normal. No respiratory distress. He has no wheezes. He has no rales.  Abdominal: Soft. Bowel sounds are normal. He exhibits no distension. There is no tenderness. There is no rebound.  Musculoskeletal: Normal range of motion. He exhibits no edema or tenderness.  Left arm wound in dressing with wound VAC, draining bloody fluid.  Neurological: He is alert and oriented to person, place, and time. No cranial nerve deficit.  Skin: No rash noted. No erythema.  Psychiatric: Affect normal.   LABORATORY PANEL:  Male CBC Recent Labs  Lab 06/14/17 0936 06/16/17 0431  WBC 8.9  --   HGB 8.8* 8.4*  HCT 26.8*  --   PLT 184  --    ------------------------------------------------------------------------------------------------------------------ Chemistries  Recent Labs  Lab 06/16/17 0431  NA 134*  K 4.4  CL 95*  CO2 29  GLUCOSE 115*  BUN 19  CREATININE 4.90*  CALCIUM 8.7*   RADIOLOGY:  No results found. ASSESSMENT AND PLAN:   [name]  is a 56 y.o. male with a known history of end-stage renal disease on hemodialysis on Monday, Wednesday and Friday, COPD, essential hypertension, depression was seen by vascular surgery while patient was in behavioral health unit for depression regarding infection of the left upper extremity. Patient was seen by vascular surgery who has requested a hospitalist to admit the patient for possible surgery  # LUE infection. Discontinue IV Zosyn and vancomycin S/P left upper arm I&D by Dr. Delana Meyer, wound care and [facility].  Follow-up wound culture: NO growth so far.  Change to p.o. Augmentin 500 mg p.o. daily per Dr. Candiss Norse. Plavix  is on hold as surgery is anticipated. Resumed.  #Chronic hypoxic respiratory failure  2/2Chronic history of COPD Bronchodilator therapy as needed Continue oxygen via nasal cannula  Hyperkalemia.  Improved  with hemodialysis.  Chronic hyponatremia.  Improved.  #End-stage renal disease Continue hemodialysis on Monday Wednesday and Friday.  HD tomorrow per Dr. Candiss Norse.   #Essential hypertension, BP is elevated. Not on HTN meds.Started norvasc.  IV hydralazine as needed  #Tobacco abuse disorder Consultation to quit smoking provide nicotine patch.  Discussed with Dr. Candiss Norse. All the records are reviewed and case discussed with Care Management/Social Worker. Management plans discussed with the patient, family and they are in agreement.  CODE STATUS: Full Code  TOTAL TIME TAKING CARE OF THIS PATIENT: 28 minutes.   More than 50% of the time was spent in counseling/coordination of care: YES  POSSIBLE D/C IN 1-2 DAYS, DEPENDING ON CLINICAL CONDITION.   [name] M.D on 06/16/2017 at 2:43 PM  Between 7am to 6pm - Pager - [phone]  After 6pm go to [email or web address] - [employer] [facility]

## 2017-06-16 NOTE — Progress Notes (Signed)
 Vein & Vascular Surgery  Daily Progress Note   Subjective: 2 Days Post-Op: Excisional debridement of left upper extremity wound with wound VAC placement  Patient with improvement in pain to the wound site.  Right IJ PermCath functioning without issue.  Patient is without complaint.  Objective: Vitals:   06/15/17 1800 06/15/17 2029 06/16/17 0443 06/16/17 1239  BP:  (!) 166/90 (!) 168/81 (!) 160/83  Pulse:  (!) 102 89 72  Resp:  16 20   Temp:  98.2 F (36.8 C) 97.8 F (36.6 C) (!) 96.9 F (36.1 C)  TempSrc:  Oral Oral Axillary  SpO2:  100% 100% 96%  Weight:      Height: 5\' 9"  (1.753 m)       Intake/Output Summary (Last 24 hours) at 06/16/2017 1739 Last data filed at 06/16/2017 1400 Gross per 24 hour  Intake 860 ml  Output 151 ml  Net 709 ml   Physical Exam: A&Ox3, NAD Right IJ Permcath: Intact.  No signs of infection noted. CV: RRR Pulmonary: CTA Bilaterally Abdomen: Soft, Nontender, Nondistended Vascular:  Left upper extremity: VAC intact.  Back to suction without leak.  Mild to moderate left upper extremity edema.  2+ radial pulses noted.  VAC dressing removed.  Healthy wound bed.  Minimal drainage.  No necrotic tissue.  Surrounding skin is healthy.   Laboratory: CBC    Component Value Date/Time   WBC 8.9 06/14/2017 0936   HGB 8.4 (L) 06/16/2017 0431   HCT 26.8 (L) 06/14/2017 0936   PLT 184 06/14/2017 0936   BMET    Component Value Date/Time   NA 134 (L) 06/16/2017 0431   K 4.4 06/16/2017 0431   CL 95 (L) 06/16/2017 0431   CO2 29 06/16/2017 0431   GLUCOSE 115 (H) 06/16/2017 0431   BUN 19 06/16/2017 0431   CREATININE 4.90 (H) 06/16/2017 0431   CREATININE 9.18 (H) 02/15/2017 1425   CALCIUM 8.7 (L) 06/16/2017 0431   CALCIUM 8.1 (L) 07/11/2011 1117   GFRNONAA 12 (L) 06/16/2017 0431   GFRNONAA 6 (L) 02/15/2017 1425   GFRAA 14 (L) 06/16/2017 0431   GFRAA 7 (L) 02/15/2017 1425   Assessment/Planning: The patient is a 56 year old male status post a  left upper extremity wound debridement with VAC placement - Stable 1) Wound: First VAC dressing change completed by the surgical team.  The wound bed is healthy.  The surrounding skin is healthy.  There is minimal drainage.  The patient's home Medela VAC was placed.  VAC dressing intact and to suction.  Home nursing services are in place for discharge. 2) Patient to continue to dialyze through his right IJ PermCath. 3) Patient is to follow-up with [place] in one week for a wound check after discharge. 4) Patient will need new permanent dialysis access creation in the future.  Marcelle Overlie PA-C 06/16/2017 5:39 PM

## 2017-06-16 NOTE — Progress Notes (Signed)
Patient is continuing to ask for liquids.  He has been reminded about his fluid restriction but is not interested in hearing about it

## 2017-06-16 NOTE — Care Management Important Message (Signed)
Important Message  Patient Details  Name: [name] MRN: [id number] Date of Birth: 01/02/1961   Medicare Important Message Given:  Yes    Beverly Sessions, RN 06/16/2017, 3:28 PM

## 2017-06-17 LAB — CBC
HCT: 23.8 % — ABNORMAL LOW (ref 40.0–52.0)
Hemoglobin: 7.9 g/dL — ABNORMAL LOW (ref 13.0–18.0)
MCH: 28.2 pg (ref 26.0–34.0)
MCHC: 33.3 g/dL (ref 32.0–36.0)
MCV: 84.9 fL (ref 80.0–100.0)
Platelets: 213 10*3/uL (ref 150–440)
RBC: 2.8 MIL/uL — ABNORMAL LOW (ref 4.40–5.90)
RDW: 19.9 % — ABNORMAL HIGH (ref 11.5–14.5)
WBC: 5.3 10*3/uL (ref 3.8–10.6)

## 2017-06-17 MED ORDER — RENA-VITE PO TABS: 1.0000 | ORAL_TABLET | Freq: Every day | ORAL | 0 refills | Status: DC

## 2017-06-17 MED ORDER — OCUVITE-LUTEIN PO CAPS: 1.0000 | ORAL_CAPSULE | Freq: Every day | ORAL | 0 refills | Status: DC

## 2017-06-17 MED ORDER — NEPRO/CARBSTEADY PO LIQD
237.0000 mL | Freq: Three times a day (TID) | ORAL | Status: DC
  Administered 2017-06-17: 237 mL via ORAL

## 2017-06-17 MED ORDER — VITAMIN C 500 MG PO TABS
500.0000 mg | ORAL_TABLET | Freq: Two times a day (BID) | ORAL | Status: DC
  Administered 2017-06-17: 500 mg via ORAL
  Filled 2017-06-17 (×2): qty 1

## 2017-06-17 MED ORDER — PREMIER PROTEIN SHAKE: 11.0000 [oz_av] | Freq: Two times a day (BID) | ORAL | Status: DC

## 2017-06-17 MED ORDER — RENA-VITE PO TABS: 1.0000 | ORAL_TABLET | Freq: Every day | ORAL | Status: DC

## 2017-06-17 MED ORDER — ASCORBIC ACID 500 MG PO TABS: 500.0000 mg | ORAL_TABLET | Freq: Two times a day (BID) | ORAL | 0 refills | Status: DC

## 2017-06-17 MED ORDER — AMOXICILLIN-POT CLAVULANATE 500-125 MG PO TABS: 1.0000 | ORAL_TABLET | Freq: Every day | ORAL | 0 refills | Status: DC

## 2017-06-17 MED ORDER — ISOSORBIDE MONONITRATE ER 30 MG PO TB24
30.0000 mg | ORAL_TABLET | Freq: Every day | ORAL | Status: DC
  Administered 2017-06-17: 30 mg via ORAL
  Filled 2017-06-17: qty 1

## 2017-06-17 MED ORDER — NEPRO/CARBSTEADY PO LIQD: 237.0000 mL | Freq: Three times a day (TID) | ORAL | 0 refills | Status: DC

## 2017-06-17 MED ORDER — ISOSORBIDE MONONITRATE ER 30 MG PO TB24: 30.0000 mg | ORAL_TABLET | Freq: Every day | ORAL | 1 refills | Status: DC

## 2017-06-17 MED ORDER — OCUVITE-LUTEIN PO CAPS
1.0000 | ORAL_CAPSULE | Freq: Every day | ORAL | Status: DC
  Administered 2017-06-17: 1 via ORAL
  Filled 2017-06-17: qty 1

## 2017-06-17 MED ORDER — AMLODIPINE BESYLATE 10 MG PO TABS: 10.0000 mg | ORAL_TABLET | Freq: Every day | ORAL | 2 refills | Status: DC

## 2017-06-17 MED ORDER — HYDRALAZINE HCL 20 MG/ML IJ SOLN: 10.0000 mg | Freq: Four times a day (QID) | INTRAMUSCULAR | Status: DC | PRN

## 2017-06-17 NOTE — Progress Notes (Signed)
[facility]  Daily Progress Note   Subjective: 3 Days Post-Op: Excisional debridement of left upper extremity wound with wound VAC placement  OR VAC removed last night. VAC working well until this AM. Requested by nursing to come change VAC. VAC still to seal this AM however suction not working. When [facility] was removed clotted bloody drainage noted to be in tubing inhibiting suction. New VAC placed. Sealed and to suction.   Patient without complaint this AM.   Objective: Vitals:   06/16/17 2226 06/16/17 2315 06/17/17 0324 06/17/17 0339  BP: (!) 171/92 136/89 (!) 173/104 (!) 157/92  Pulse: (!) 102 98 99 98  Resp: 19 17 19    Temp: 97.8 F (36.6 C) 97.7 F (36.5 C)    TempSrc: Oral Oral    SpO2: 100% 100% 100%   Weight:      Height:        Intake/Output Summary (Last 24 hours) at 06/17/2017 0848 Last data filed at 06/17/2017 0332 Gross per 24 hour  Intake 50 ml  Output 381 ml  Net -331 ml   Physical Exam: A&Ox3, NAD Right IJ Permcath: Intact. No signs of infection noted.  CV: RRR Pulmonary: CTA Bilaterally Abdomen: Soft, Nontender, Nondistended Vascular:  Left Upper Extremity: VAC intact. To suction. Mild upper extremity edema. 2+ radial pulse noted. Motor and sensation intact.   Wound:  Wound bed is dry. Minimal drainage from upper aspect of skin edge. Wound bed is healthy. No necrotic tissue. No purulent drainage.   Laboratory: CBC    Component Value Date/Time   WBC 5.3 06/17/2017 0441   HGB 7.9 (L) 06/17/2017 0441   HCT 23.8 (L) 06/17/2017 0441   PLT 213 06/17/2017 0441   BMET    Component Value Date/Time   NA 134 (L) 06/16/2017 0431   K 4.4 06/16/2017 0431   CL 95 (L) 06/16/2017 0431   CO2 29 06/16/2017 0431   GLUCOSE 115 (H) 06/16/2017 0431   BUN 19 06/16/2017 0431   CREATININE 4.90 (H) 06/16/2017 0431   CREATININE 9.18 (H) 02/15/2017 1425   CALCIUM 8.7 (L) 06/16/2017 0431   CALCIUM 8.1 (L) 07/11/2011 1117   GFRNONAA 12 (L)  06/16/2017 0431   GFRNONAA 6 (L) 02/15/2017 1425   GFRAA 14 (L) 06/16/2017 0431   GFRAA 7 (L) 02/15/2017 1425   Assessment/Planning: The patient is a 56 year old male status post a left upper extremity wound debridement with VAC placement POD #3- Stable 1) Patient to follow up with original Vascular surgeon Dr. Oneida Alar in [place] for wound care / new dialysis creation as per Dr. Delana Meyer. 2) If VAC continues to clot and inhibit suction, a different machine ([employer] / [employer]) may need to be ordered. Wound bed is dry. Minimal drainage from upper aspect of skin edge.  3) Patient to continue to dialyze through permcath until permanent access is created 4) As per Vascular team, patient can be discharged home today and to Dr. Oneida Alar care.  5) Home nursing care for wound changes as per case management.  Discussed with Dr. Eber Hong Stegmayer PA-C 06/17/2017 8:48 AM

## 2017-06-17 NOTE — Care Management (Signed)
Per [facility] HD liaison.  Her information is below, as she does not have Epic access to document.  DC having a difficult time with patient and clinic.  Patient refused to go back to his home clinic due to fear, saying that he holds the clinic responsible for "cutting" his arm. All transfer options have been exhausted. According to [facility], the doctors at [place] refused to treat patient. [place] room is full. [place] has no Iso room, and [place] is not accepting new patient due to staffing.  Per Quita Skye at [facility], patient has been going to this clinic for 5+ years, does not have a Scientific laboratory technician. And other then threatening to call a lawyer and blaming the clinic when ever anything happens to his arm, they don't have any problems with him.  This DC spoke to patient again about going back to have treatments since he has a CVC in place and then work on a transfer. Patient agreed as long as he could get on a 2nd shift chair.  DC called the clinic and spoke to [facility] to try and explain what the patient said. Nicoletta Ba spoke continuously without allowing DC to speak, saying that if the patient does not feel safe coming to this clinic then he should be transferred and that we should be helping the patient and get [facility] Admissions to escalate his transfer. When DC was able to explained that patient is medically cleared, and that since they are still willing to accept patient back the, could he please get a 2nd shift chair, per patient request. Nicoletta Ba said no that he has a chair time and that they shouldn't need to change it. DC asked if there was someone else to speak with that can make that decision. After a long hold DC spoke to [facility].  Vicky said that she was not going to put an Iso patient on her 2nd shift when the first shift is not full. Also, Olegario Shearer suggested that DC call patient Doctor to see if he could convince the other doctors to accept the patient.  Bottom line [facility] are not wiling to change patient chair time.  DC spoke to [facility] and explained everything to her, she was going to make a few calls and follow back up this DC.  DC waiting to hear back. Per Pam, after speaking to [place]. This patient is manipulative and needs everyone to be consistent. To tell patient that he has a chair time of 7:00 MWF and to leave it up to the patient weather he is going to follow up this the clinic.

## 2017-06-17 NOTE — Anesthesia Postprocedure Evaluation (Signed)
Anesthesia Post Note  Patient: [name]  Procedure(s) Performed: IRRIGATION AND DEBRIDEMENT EXTREMITY (Left ) APPLICATION OF WOUND VAC (Left )  Patient location during evaluation: PACU Anesthesia Type: General Level of consciousness: awake and alert Pain management: pain level controlled Vital Signs Assessment: post-procedure vital signs reviewed and stable Respiratory status: spontaneous breathing, nonlabored ventilation, respiratory function stable and patient connected to nasal cannula oxygen Cardiovascular status: blood pressure returned to baseline and stable Postop Assessment: no apparent nausea or vomiting Anesthetic complications: no     Last Vitals:  Vitals:   06/17/17 1200 06/17/17 1215  BP: (!) 153/70 (!) 172/68  Pulse: 77 81  Resp: 16 16  Temp:    SpO2:      Last Pain:  Vitals:   06/17/17 1000  TempSrc: Oral  PainSc: [place]

## 2017-06-17 NOTE — Progress Notes (Signed)
Wound vac reapplied after detecting leak. Wound vac properly suctioning at this time.

## 2017-06-17 NOTE — Progress Notes (Signed)
Primary nurse noted that wound vac was beeping. Wound Vac read occluded. Primary nurse tried multiple things to trouble shoot problem and  was unsuccessful. Primary nurse paged and spoke to [facility]. Joelene Millin informed primary nurse that she would round on pt when she got to the hospital. Primary nurse to continue to monitor pt.

## 2017-06-17 NOTE — Consult Note (Signed)
[facility] Consult   Reason for Consult: This is a follow-up consult for this 56 year old man seen previously after he was transferred from the psychiatric unit.  Question has now risen regarding his capacity Referring Physician: Bridgett Larsson Patient Identification: [name] MRN:  [id number] Principal Diagnosis: Adjustment disorder with mixed anxiety and depressed mood Diagnosis:   Patient Active Problem List   Diagnosis Date Noted  . Personality disorder ([place]) [F60.9] 06/13/2017  . Cellulitis [L03.90] 06/12/2017  . Adjustment disorder with mixed anxiety and depressed mood [F43.23] 06/10/2017  . Suicidal ideation [R45.851] 06/10/2017  . Arm wound, left, sequela [S41.102S] 06/10/2017  . Dyspnea [R06.00] 05/29/2017  . Tachycardia [R00.0] 05/29/2017  . Hyperkalemia [E87.5] 05/22/2017  . Acute metabolic encephalopathy [K74.25]   . Anemia [D64.9] 04/23/2017  . Ascites [R18.8] 04/23/2017  . COPD (chronic obstructive pulmonary disease) ([place]) [J44.9] 04/23/2017  . Acute on chronic respiratory failure with hypoxia ([place]) [J96.21] 03/25/2017  . Atrial flutter ([place]) [I48.92] 03/25/2017  . Arrhythmia [I49.9] 03/25/2017  . COPD GOLD II/ still smoking [J44.9] 09/27/2016  . Essential hypertension [I10] 09/27/2016  . Fluid overload [E87.70] 08/30/2016  . COPD exacerbation ([place]) [J44.1] 08/17/2016  . Hypertensive urgency [I16.0] 08/17/2016  . Respiratory failure ([place]) [J96.90] 08/17/2016  . Problem with dialysis access [facility]) [T82.898A] 07/23/2016  . End stage renal disease ([place]) [N18.6] 12/02/2015  . Chronic hepatitis B ([place]) [B18.1] 03/05/2014  . Chronic hepatitis C without hepatic coma ([place]) [B18.2] 03/05/2014  . Internal hemorrhoids with bleeding, swelling and itching [K64.8] 03/05/2014  . Thrombocytopenia ([place]) [D69.6] 03/05/2014  . Chest pain [R07.9] 02/27/2014  . Alcohol abuse [F10.10] 04/14/2009  . Tobacco use disorder [F17.200] 04/14/2009  . GANGLION CYST [M67.40]  04/14/2009    Total Time spent with patient: 30 minutes  Subjective:   Alassane Kalafut is a 56 y.o. male patient admitted with "they are making a fool of me".  HPI: This is a follow-up for this 56 year old man on dialysis.  He had originally been admitted to the psychiatric ward because of making suicidal statements but was transferred to the medical service soon thereafter.  On all subsequent follow-ups he has denied any acute or serious suicidal thought.  I am asked to come back to see him today because I am told that he is making statements about refusing specific dialysis treatments.  It appears that the patient will be returning to his home and the only hemodialysis provider that is accessible to him is the same one he had been receiving services from previously.  The patient blames the people at this dialysis Center for his medical complications and states that he and his sister are intending to sue them.  He says it would be "making a fool of me" if he were to go back there to get dialysis treatment.  Patient understands that if he does not continue to get regular hemodialysis he will die from his condition and he is able to spontaneously tell me that.  He also tells me that he does not want to die.  He denies being depressed and does not present as psychotic.  He is alert and oriented.  Patient believes that the staff F working with him have not done an adequate job in trying to find alternative dialysis centers.  He has a paranoid though not psychotic outlook and believes that all of the problems are the result of other people not being willing to give him appropriate services.  Social history: Apparently lives mostly by himself  in [place] although it although it sounds like he has a sister who is closely involved in his care.  Medical history: Diabetes long-term hemodialysis recently had a failed catheter or shunt on his left arm that has resulted in some complications and  surgery.  Past Psychiatric History: Patient denies any past psychiatric treatment.  Chart indicates a history of substance abuse which he says he does not engage in anymore.  Denies any history of suicide attempts or psychiatric hospitalization  Risk to Self: Is patient at risk for suicide?: No Risk to Others:   Prior Inpatient Therapy:   Prior Outpatient Therapy:    Past Medical History:  Past Medical History:  Diagnosis Date  . Adenomatous colon polyp    tubular  . Anemia   . Anxiety   . Arthritis    left shoulder  . Atherosclerosis of aorta ([place])   . Cardiomegaly   . Chest pain    DATE UNKNOWN, C/O PERIODICALLY  . Chronic kidney disease    dialysis - M/W/F  . Cocaine abuse ([place])   . COPD exacerbation ([place]) 08/17/2016  . Dialysis patient ([place])    Monday-Wednesday-Friday  . GERD (gastroesophageal reflux disease)    DATE UNKNOWN  . Hemorrhoids   . Hepatitis B, chronic ([place])   . Hepatitis C   . Hyperkalemia   . Hypertension   . Metabolic bone disease    Patient denies  . Nephrolithiasis   . Pneumonia   . Pulmonary edema   . Renal disorder   . Renal insufficiency   . Shortness of breath dyspnea    " for the last past year with this dialysis"  . Tubular adenoma of colon     Past Surgical History:  Procedure Laterality Date  . A/V FISTULAGRAM Left 05/26/2017   Procedure: A/V FISTULAGRAM;  Surgeon: Conrad Walnut, MD;  Location: [facility];  Service: Cardiovascular;  Laterality: Left;  . APPLICATION OF WOUND VAC Left 06/14/2017   Procedure: APPLICATION OF WOUND VAC;  Surgeon: Katha Cabal, MD;  Location: [facility];  Service: Vascular;  Laterality: Left;  . AV FISTULA PLACEMENT  2012   BELIEVED WAS PLACED IN JUNE  . COLONOSCOPY    . CORONARY STENT INTERVENTION N/A 02/22/2017   Procedure: CORONARY STENT INTERVENTION;  Surgeon: Nigel Mormon, MD;  Location: [facility];  Service: Cardiovascular;  Laterality: N/A;  . HEMORRHOID BANDING    . I&D  EXTREMITY Left 06/01/2017   Procedure: IRRIGATION AND DEBRIDEMENT LEFT ARM HEMATOMA WITH LIGATION OF LEFT ARM AV FISTULA;  Surgeon: Elam Dutch, MD;  Location: [facility];  Service: Vascular;  Laterality: Left;  . I&D EXTREMITY Left 06/14/2017   Procedure: IRRIGATION AND DEBRIDEMENT EXTREMITY;  Surgeon: Katha Cabal, MD;  Location: [facility];  Service: Vascular;  Laterality: Left;  . INSERTION OF DIALYSIS CATHETER  05/30/2017  . INSERTION OF DIALYSIS CATHETER N/A 05/30/2017   Procedure: INSERTION OF DIALYSIS CATHETER;  Surgeon: Elam Dutch, MD;  Location: [facility] OR;  Service: Vascular;  Laterality: N/A;  . LEFT HEART CATH AND CORONARY ANGIOGRAPHY N/A 02/22/2017   Procedure: LEFT HEART CATH AND CORONARY ANGIOGRAPHY;  Surgeon: Nigel Mormon, MD;  Location: [facility];  Service: Cardiovascular;  Laterality: N/A;  . LIGATION OF ARTERIOVENOUS  FISTULA Left 02/15/5783   Procedure: Plication of Left Arm Arteriovenous Fistula;  Surgeon: Elam Dutch, MD;  Location: [facility];  Service: Vascular;  Laterality: Left;  . POLYPECTOMY    .  REVISON OF ARTERIOVENOUS FISTULA Left 3/54/5625   Procedure: PLICATION OF DISTAL ANEURYSMAL SEGEMENT OF LEFT UPPER ARM ARTERIOVENOUS FISTULA;  Surgeon: Elam Dutch, MD;  Location: [facility];  Service: Vascular;  Laterality: Left;  . REVISON OF ARTERIOVENOUS FISTULA Left 6/38/9373   Procedure: Plication of Left Upper Arm Fistula ;  Surgeon: Waynetta Sandy, MD;  Location: [facility];  Service: Vascular;  Laterality: Left;  . THROMBECTOMY W/ EMBOLECTOMY Left 06/05/2017   Procedure: EXPLORATION OF LEFT ARM FOR BLEEDING; OVERSEWED PROXIMAL FISTULA;  Surgeon: Angelia Mould, MD;  Location: [facility];  Service: Vascular;  Laterality: Left;  . WOUND EXPLORATION Left 06/03/2017   Procedure: WOUND EXPLORATION WITH WOUND VAC APPLICATION TO LEFT ARM;  Surgeon: Angelia Mould, MD;  Location: [facility] OR;  Service: Vascular;  Laterality: Left;   Family  History:  Family History  Problem Relation Age of Onset  . Heart disease Mother   . Lung cancer Mother   . Heart disease Father   . Malignant hyperthermia Father   . COPD Father   . Throat cancer Sister   . Esophageal cancer Sister   . Hypertension Other   . COPD Other   . Colon cancer Neg Hx   . Colon polyps Neg Hx   . Rectal cancer Neg Hx   . Stomach cancer Neg Hx    Family Psychiatric  History: Denies any Social History:  Social History   Substance and Sexual Activity  Alcohol Use Yes  . Alcohol/week: 2.4 oz  . Types: 1 Glasses of wine, 1 Cans of beer, 1 Shots of liquor, 1 Standard drinks or equivalent per week     Social History   Substance and Sexual Activity  Drug Use Yes  . Types: Marijuana, Cocaine    Social History   Socioeconomic History  . Marital status: Single    Spouse name: None  . Number of children: 3  . Years of education: 10  . Highest education level: None  Social Needs  . Financial resource strain: None  . Food insecurity - worry: None  . Food insecurity - inability: None  . Transportation needs - medical: None  . Transportation needs - non-medical: None  Occupational History  . Occupation: Unemployed  Tobacco Use  . Smoking status: Current Every Day Smoker    Packs/day: 0.50    Years: 43.00    Pack years: 21.50    Types: Cigarettes    Start date: 08/13/1973  . Smokeless tobacco: Never Used  . Tobacco comment: Less than 1/2 pk per day  Substance and Sexual Activity  . Alcohol use: Yes    Alcohol/week: 2.4 oz    Types: 1 Glasses of wine, 1 Cans of beer, 1 Shots of liquor, 1 Standard drinks or equivalent per week  . Drug use: Yes    Types: Marijuana, Cocaine  . Sexual activity: None    Comment: "NOT LATELY" on cocaine  Other Topics Concern  . None  Social History Narrative   Lives alone   Caffeine use: Coffee-rare   Soda- daily      [facility] (03/10/17):   Originally from [facility]. Previously worked trimming trees. No pets  currently. No bird or mold exposure.    Additional Social History:    Allergies:   Allergies  Allergen Reactions  . Aspirin Other (See Comments)    STOMACH PAIN  . Clonidine Derivatives Itching  . Tramadol Itching    Labs:  Results for orders placed or performed during  the hospital encounter of 06/12/17 (from the past 48 hour(s))  Vancomycin, random     Status: None   Collection Time: 06/15/17  4:56 PM  Result Value Ref Range   Vancomycin Rm 11     Comment:        Random Vancomycin therapeutic range is dependent on dosage and time of specimen collection. A peak range is 20.0-40.0 ug/mL A trough range is 5.0-15.0 ug/mL          Basic metabolic panel     Status: Abnormal   Collection Time: 06/16/17  4:31 AM  Result Value Ref Range   Sodium 134 (L) 135 - 145 mmol/L   Potassium 4.4 3.5 - 5.1 mmol/L   Chloride 95 (L) 101 - 111 mmol/L   CO2 29 22 - 32 mmol/L   Glucose, Bld 115 (H) 65 - 99 mg/dL   BUN 19 6 - 20 mg/dL   Creatinine, Ser 4.90 (H) 0.61 - 1.24 mg/dL   Calcium 8.7 (L) 8.9 - 10.3 mg/dL   GFR calc non Af Amer 12 (L) >60 mL/min   GFR calc Af Amer 14 (L) >60 mL/min    Comment: (NOTE) The eGFR has been calculated using the CKD EPI equation. This calculation has not been validated in all clinical situations. eGFR's persistently <60 mL/min signify possible Chronic Kidney Disease.    Anion gap 10 5 - 15  Hemoglobin     Status: Abnormal   Collection Time: 06/16/17  4:31 AM  Result Value Ref Range   Hemoglobin 8.4 (L) 13.0 - 18.0 g/dL  CBC     Status: Abnormal   Collection Time: 06/17/17  4:41 AM  Result Value Ref Range   WBC 5.3 3.8 - 10.6 K/uL   RBC 2.80 (L) 4.40 - 5.90 MIL/uL   Hemoglobin 7.9 (L) 13.0 - 18.0 g/dL   HCT 23.8 (L) 40.0 - 52.0 %   MCV 84.9 80.0 - 100.0 fL   MCH 28.2 26.0 - 34.0 pg   MCHC 33.3 32.0 - 36.0 g/dL   RDW 19.9 (H) 11.5 - 14.5 %   Platelets 213 150 - 440 K/uL    Current Facility-Administered Medications  Medication Dose Route  Frequency Provider Last Rate Last Dose  . acetaminophen (TYLENOL) tablet 500-1,000 mg  500-1,000 mg Oral Q6H PRN [name], MD   1,000 mg at 06/16/17 2051  . albuterol (PROVENTIL) (2.5 MG/3ML) 0.083% nebulizer solution 2.5 mg  2.5 mg Nebulization Q4H PRN Gouru, Aruna, MD   2.5 mg at 06/12/17 1808  . amLODipine (NORVASC) tablet 10 mg  10 mg Oral Daily [name], MD   10 mg at 06/17/17 1340  . amoxicillin-clavulanate (AUGMENTIN) 500-125 MG per tablet 500 mg  1 tablet Oral Daily [name], MD   500 mg at 06/17/17 1342  . calcitRIOL (ROCALTROL) capsule 0.75 mcg  0.75 mcg Oral Q M,W,F-HD Gouru, Aruna, MD   0.75 mcg at 06/17/17 0847  . Chlorhexidine Gluconate Cloth 2 % PADS 6 each  6 each Topical Q0600 [name], MD   6 each at 06/17/17 [phone]  . clopidogrel (PLAVIX) tablet 75 mg  75 mg Oral Daily [name], MD   75 mg at 06/17/17 1340  . diphenhydrAMINE (BENADRYL) capsule 25 mg  25 mg Oral Q6H PRN Lance Coon, MD   25 mg at 06/17/17 0847  . epoetin alfa (EPOGEN,PROCRIT) injection 10,000 Units  10,000 Units Intravenous Q M,W,F-HD Lavonia Dana, MD   10,000 Units at 06/17/17 1336  .  feeding supplement (NEPRO CARB STEADY) liquid 237 mL  237 mL Oral TID BM [name], MD   Stopped at 06/17/17 1030  . gabapentin (NEURONTIN) capsule 100 mg  100 mg Oral BID Nicholes Mango, MD   Stopped at 06/17/17 1030  . heparin injection 5,000 Units  5,000 Units Subcutaneous Q8H Gouru, Illene Silver, MD   5,000 Units at 06/17/17 1343  . hydrALAZINE (APRESOLINE) injection 10 mg  10 mg Intravenous Q6H PRN [name], MD      . isosorbide mononitrate (IMDUR) 24 hr tablet 30 mg  30 mg Oral Daily [name], MD      . minoxidil (LONITEN) tablet 10 mg  10 mg Oral BID PRN Gouru, Aruna, MD      . multivitamin (RENA-VIT) tablet 1 tablet  1 tablet Oral QHS [name], MD      . multivitamin-lutein [facility]) capsule 1 capsule  1 capsule Oral Daily [name], MD   1 capsule at 06/17/17 1341  . nicotine (NICODERM CQ - dosed in mg/24  hours) patch 14 mg  14 mg Transdermal Daily Gouru, Aruna, MD   14 mg at 06/17/17 1030  . ondansetron (ZOFRAN) tablet 4 mg  4 mg Oral Q8H PRN Gouru, Aruna, MD      . oxyCODONE (Oxy IR/ROXICODONE) immediate release tablet 10 mg  10 mg Oral Q4H PRN Algernon Huxley, MD   10 mg at 06/17/17 0346  . senna (SENOKOT) tablet 8.6 mg  1 tablet Oral Daily PRN Gouru, Aruna, MD      . sevelamer carbonate (RENVELA) tablet 3,200 mg  3,200 mg Oral TID WC Gouru, Aruna, MD   3,200 mg at 06/17/17 0847  . simethicone (MYLICON) chewable tablet 80 mg  80 mg Oral QID PRN [name], MD   80 mg at 06/16/17 1354  . vitamin C (ASCORBIC ACID) tablet 500 mg  500 mg Oral BID [name], MD   500 mg at 06/17/17 1342  . zolpidem (AMBIEN) tablet 10 mg  10 mg Oral QHS PRN Nicholes Mango, MD   10 mg at 06/16/17 2250    Musculoskeletal: Strength & Muscle Tone: within normal limits Gait & Station: unsteady Patient leans: N/A  Psychiatric Specialty Exam: Physical Exam  Nursing note and vitals reviewed. Constitutional: He appears well-developed.  HENT:  Head: Normocephalic and atraumatic.  Eyes: Conjunctivae are normal. Pupils are equal, round, and reactive to light.  Neck: Normal range of motion.  Cardiovascular: Regular rhythm and normal heart sounds.  Respiratory: Effort normal.  GI: Soft.  Musculoskeletal: Normal range of motion.  Neurological: He is alert.  Skin: Skin is warm and dry.     Psychiatric: His affect is angry. His speech is tangential. He is agitated. He is not aggressive, not hyperactive and not combative. Thought content is paranoid. Thought content is not delusional. Cognition and memory are normal. He expresses impulsivity. He expresses no homicidal and no suicidal ideation.    Review of Systems  Constitutional: Positive for malaise/fatigue.  HENT: Negative.   Eyes: Negative.   Respiratory: Negative.   Cardiovascular: Negative.   Gastrointestinal: Negative.   Musculoskeletal: Negative.   Skin:  Negative.   Neurological: Negative.   Psychiatric/Behavioral: Negative for depression, hallucinations, memory loss, substance abuse and suicidal ideas. The patient is not nervous/anxious and does not have insomnia.     Blood pressure (!) 153/73, pulse 94, temperature 98 F (36.7 C), temperature source Oral, resp. rate 16, height 5' 9"  (1.753 m), weight 87 kg (191 lb  12.8 oz), SpO2 100 %.Body mass index is 28.32 kg/m.  General Appearance: Casual  Eye Contact:  Minimal  Speech:  Clear and Coherent  Volume:  Normal  Mood:  Angry  Affect:  Inappropriate  Thought Process:  Goal Directed  Orientation:  Full (Time, Place, and Person)  Thought Content:  Paranoid Ideation  Suicidal Thoughts:  No  Homicidal Thoughts:  No  Memory:  Immediate;   Fair Recent;   Fair Remote;   Fair  Judgement:  Intact  Insight:  Fair  Psychomotor Activity:  Decreased  Concentration:  Concentration: Fair  Recall:  [employer] of Knowledge:  Fair  Language:  Fair  Akathisia:  No  Handed:  Right  AIMS (if indicated):     Assets:  Communication Skills Desire for Improvement Housing  ADL's:  Impaired  Cognition:  Impaired,  Mild  Sleep:        Treatment Plan Summary: Plan This 56 year old man presents as somebody who has a chronically aggrieved and paranoid outlook towards others.  Nevertheless there is no evidence of actual delusions or psychosis.  Patient understands the point of hemodialysis and understands the risk of severe illness and death if he does not get dialysis regularly.  He does not appear to be suicidal.  He appears to be able to think through his decisions although they are processed through his assumptions about what he can accomplish for himself and his belief that staff are not working hard enough to find him dialysis.  Patient retains capacity to make decisions specifically the one and they are concerned about about whether he goes to dialysis at a particular center or not.  No need for any  specific psychiatric intervention.  No medicine indicated.  Tried to provide some supportive therapy and guidance for the patient probably to little effect.  Disposition: Patient does not meet criteria for psychiatric inpatient admission. Supportive therapy provided about ongoing stressors.  Alethia Berthold, MD 06/17/2017 2:11 PM

## 2017-06-17 NOTE — Discharge Summary (Signed)
[facility] NAME: [name]    MR#:  [id number]  DATE OF BIRTH:  1961/06/14  DATE OF ADMISSION:  06/12/2017   ADMITTING PHYSICIAN: Nicholes Mango, MD  DATE OF DISCHARGE: 06/17/2017  PRIMARY CARE PHYSICIAN: [name], MD   ADMISSION DIAGNOSIS:  misplaced shunt DISCHARGE DIAGNOSIS:  Principal Problem:   Adjustment disorder with mixed anxiety and depressed mood Active Problems:   Cellulitis   Personality disorder ([place])  SECONDARY DIAGNOSIS:   Past Medical History:  Diagnosis Date  . Adenomatous colon polyp    tubular  . Anemia   . Anxiety   . Arthritis    left shoulder  . Atherosclerosis of aorta ([place])   . Cardiomegaly   . Chest pain    DATE UNKNOWN, C/O PERIODICALLY  . Chronic kidney disease    dialysis - M/W/F  . Cocaine abuse ([place])   . COPD exacerbation ([place]) 08/17/2016  . Dialysis patient ([place])    Monday-Wednesday-Friday  . GERD (gastroesophageal reflux disease)    DATE UNKNOWN  . Hemorrhoids   . Hepatitis B, chronic ([place])   . Hepatitis C   . Hyperkalemia   . Hypertension   . Metabolic bone disease    Patient denies  . Nephrolithiasis   . Pneumonia   . Pulmonary edema   . Renal disorder   . Renal insufficiency   . Shortness of breath dyspnea    " for the last past year with this dialysis"  . Tubular adenoma of colon    HOSPITAL COURSE:   [name] y.o.malewith a known history of end-stage renal disease on hemodialysis on Monday, Wednesday and Friday, COPD, essential hypertension, depression was seen by vascular surgery while patient was in behavioral health unit for depression regardinginfection of the left upper extremity.Patient was seen by vascular surgery who has requested a hospitalist to admit the patient for possible surgery  # LUE infection. Discontinued IV Zosyn and vancomycin, Changed to p.o. Augmentin 500 mg p.o. daily per Dr. Candiss Norse. S/P left upper arm I&D by  Dr. Delana Meyer, wound care and [facility].  Follow-up wound culture: NO growth so far.  Plavix is on hold as surgery is anticipated. Resumed.  #Chronic hypoxic respiratory failure2/2Chronic history of COPD Bronchodilator therapy as needed Continue oxygen via nasal cannula  Hyperkalemia.  Improved with hemodialysis.  Chronic hyponatremia.  Improved.  #End-stage renal disease Continue hemodialysis on Monday Wednesday and Friday.  HD today per Dr. Candiss Norse.   #Essential hypertension, accelerated. Not on HTN meds.Started norvasc and add imdur.  IV hydralazine as needed  #Tobacco abuse disorder Consultation to quit smoking provide nicotine patch.  Anemia of chronic disease.  Hemoglobin decreased to 7.9.  Possible due to left arm I&D with bloody drainage.  Follow hemoglobin as outpatient.  Discussed with Dr. Candiss Norse. DISCHARGE CONDITIONS:  Stable, discharged to home with home health today. CONSULTS OBTAINED:  Treatment Team:  Delana Meyer Dolores Lory, MD Lavonia Dana, MD [place], Madie Reno, MD DRUG ALLERGIES:   Allergies  Allergen Reactions  . Aspirin Other (See Comments)    STOMACH PAIN  . Clonidine Derivatives Itching  . Tramadol Itching   DISCHARGE MEDICATIONS:   Allergies as of 06/17/2017      Reactions   Aspirin Other (See Comments)   STOMACH PAIN   Clonidine Derivatives Itching   Tramadol Itching      Medication List    TAKE these medications   acetaminophen 500 MG tablet Commonly known as:  TYLENOL Take 500-1,000 mg every 6 (six) hours as needed by mouth for headache (pain).   AEROCHAMBER MV inhaler Use as instructed   albuterol 108 (90 Base) MCG/ACT inhaler Commonly known as:  PROVENTIL HFA;VENTOLIN HFA Inhale 2 puffs into the lungs every 6 (six) hours as needed for wheezing or shortness of breath.   amLODipine 10 MG tablet Commonly known as:  NORVASC Take 1 tablet (10 mg total) by mouth daily.   amoxicillin-clavulanate 500-125 MG tablet Commonly known as:   AUGMENTIN Take 1 tablet (500 mg total) by mouth daily.   ascorbic acid 500 MG tablet Commonly known as:  VITAMIN C Take 1 tablet (500 mg total) by mouth 2 (two) times daily.   calcitRIOL 0.25 MCG capsule Commonly known as:  ROCALTROL Take 3 capsules (0.75 mcg total) by mouth every Monday, Wednesday, and Friday with hemodialysis.   clopidogrel 75 MG tablet Commonly known as:  PLAVIX Take one tablet (75mg ) daily.   feeding supplement (NEPRO CARB STEADY) Liqd Take 237 mLs by mouth 3 (three) times daily between meals.   gabapentin 100 MG capsule Commonly known as:  NEURONTIN Take 100 mg 2 (two) times daily by mouth.   isosorbide mononitrate 30 MG 24 hr tablet Commonly known as:  IMDUR Take 1 tablet (30 mg total) by mouth daily.   minoxidil 10 MG tablet Commonly known as:  LONITEN Take 10 mg 2 (two) times daily as needed by mouth (when blood pressure cuff reads "above normal").   multivitamin Tabs tablet Take 1 tablet by mouth at bedtime.   multivitamin-lutein Caps capsule Take 1 capsule by mouth daily. Start taking on:  06/18/2017   nicotine 14 mg/24hr patch Commonly known as:  NICODERM CQ - dosed in mg/24 hours Place 14 mg daily onto the skin.   ondansetron 4 MG tablet Commonly known as:  ZOFRAN Take 4 mg every 8 (eight) hours as needed by mouth for nausea or vomiting.   oxycodone 5 MG capsule Commonly known as:  OXY-IR Take 5 mg 2 (two) times daily as needed by mouth for pain.   senna 8.6 MG Tabs tablet Commonly known as:  SENOKOT Take 1 tablet (8.6 mg total) by mouth daily as needed for mild constipation.   sevelamer carbonate 800 MG tablet Commonly known as:  RENVELA Take 4 tablets (3,200 mg total) by mouth 3 (three) times daily with meals.   zolpidem 10 MG tablet Commonly known as:  AMBIEN Take 1 tablet (10 mg total) by mouth at bedtime as needed for sleep.        DISCHARGE INSTRUCTIONS:  See AVS. If you experience worsening of your admission  symptoms, develop shortness of breath, life threatening emergency, suicidal or homicidal thoughts you must seek medical attention immediately by calling 911 or calling your MD immediately  if symptoms less severe.  You Must read complete instructions/literature along with all the possible adverse reactions/side effects for all the Medicines you take and that have been prescribed to you. Take any new Medicines after you have completely understood and accpet all the possible adverse reactions/side effects.   Please note  You were cared for by a hospitalist during your hospital stay. If you have any questions about your discharge medications or the care you received while you were in the hospital after you are discharged, you can call the unit and asked to speak with the hospitalist on call if the hospitalist that took care of you is not available. Once you are discharged, your primary  care physician will handle any further medical issues. Please note that NO REFILLS for any discharge medications will be authorized once you are discharged, as it is imperative that you return to your primary care physician (or establish a relationship with a primary care physician if you do not have one) for your aftercare needs so that they can reassess your need for medications and monitor your lab values.    On the day of Discharge:  VITAL SIGNS:  Blood pressure (!) 167/99, pulse 97, temperature 98 F (36.7 C), temperature source Oral, resp. rate 16, height 5\' 9"  (1.753 m), weight 191 lb 12.8 oz (87 kg), SpO2 100 %. PHYSICAL EXAMINATION:  GENERAL:  56 y.o.-year-old patient lying in the bed with no acute distress.  EYES: Pupils equal, round, reactive to light and accommodation. No scleral icterus. Extraocular muscles intact.  HEENT: Head atraumatic, normocephalic. Oropharynx and nasopharynx clear.  NECK:  Supple, no jugular venous distention. No thyroid enlargement, no tenderness.  LUNGS: Normal breath sounds  bilaterally, no wheezing, rales,rhonchi or crepitation. No use of accessory muscles of respiration.  CARDIOVASCULAR: S1, S2 normal. No murmurs, rubs, or gallops.  ABDOMEN: Soft, non-tender, non-distended. Bowel sounds present. No organomegaly or mass.  EXTREMITIES: No pedal edema, cyanosis, or clubbing.  Left arm wound in dressing with wound VAC. NEUROLOGIC: Cranial nerves II through XII are intact. Muscle strength 5/5 in all extremities. Sensation intact. Gait not checked.  PSYCHIATRIC: The patient is alert and oriented x 3.  SKIN: No obvious rash, lesion, or ulcer.  DATA REVIEW:   CBC Recent Labs  Lab 06/17/17 0441  WBC 5.3  HGB 7.9*  HCT 23.8*  PLT 213    Chemistries  Recent Labs  Lab 06/16/17 0431  NA 134*  K 4.4  CL 95*  CO2 29  GLUCOSE 115*  BUN 19  CREATININE 4.90*  CALCIUM 8.7*     Microbiology Results  Results for orders placed or performed during the hospital encounter of 06/12/17  Aerobic/Anaerobic Culture (surgical/deep wound)     Status: None (Preliminary result)   Collection Time: 06/14/17  5:19 PM  Result Value Ref Range Status   Specimen Description ARM LEFT  Final   Special Requests NONE  Final   Gram Stain   Final    FEW WBC PRESENT, PREDOMINANTLY PMN NO ORGANISMS SEEN    Culture   Final    CULTURE REINCUBATED FOR BETTER GROWTH Performed at [facility], [place] [address], [place], [place] [address]    Report Status PENDING  Incomplete    RADIOLOGY:  No results found.   Management plans discussed with the patient, family and they are in agreement.  CODE STATUS: Full Code   TOTAL TIME TAKING CARE OF THIS PATIENT: 37 minutes.    [name] M.D on 06/17/2017 at 1:07 PM  Between 7am to 6pm - Pager - [phone]  After 6pm go to [email or web address] - Proofreader  [facility]  Office  [phone]  CC: Primary care physician; [name], MD   Note: This dictation was prepared with Dragon  dictation along with smaller phrase technology. Any transcriptional errors that result from this process are unintentional.

## 2017-06-17 NOTE — Care Management (Signed)
Patient has home wound vac in place.  There were issues this morning with clotting of the suction tubing.  Per vascular PA "If VAC continues to clot and inhibit suction, a different machine ([employer] / [employer]) may need to be ordered"   RNCM awaiting return call from [facility] to determine if patient has been accepted at a new center.

## 2017-06-17 NOTE — Care Management (Signed)
Patient to discharge home today.  Psych has confirmed that patient has capacity to make decisions. HD liaison has exhausted efforts to get patient changed to a different outpatient facility.  Please see my previous note for her documentation. Patient in agreement to return to his current outpatient facility.  He requested to change his shift to 2nd, however this could not be facilitated and patient is aware that his time is MWF at 7 am  Attending, nephrology and vascular have agreed that patient could be discharged today.  Sharyn Lull with [facility] notified of discharge.  Patient has home wound vac in place through [facility].  RNCM signing off.

## 2017-06-17 NOTE — Progress Notes (Signed)
Initial Nutrition Assessment  DOCUMENTATION CODES:   Underweight  INTERVENTION:   Nepro Shake po TID, each supplement provides 425 kcal and 19 grams protein  Ocuvite po daily for wound healing (provides zinc, vitamin A, vitamin C, Vitamin E, copper, and selenium)  Renal MVI po daily  Vitamin C 569m po BID  NUTRITION DIAGNOSIS:   Increased nutrient needs related to wound healing, catabolic illness(ESRD on HD) as evidenced by increased estimated needs from protein.  GOAL:   Patient will meet greater than or equal to 90% of their needs  MONITOR:   PO intake, Supplement acceptance, Labs, Weight trends, Skin, I & O's  REASON FOR ASSESSMENT:   LOS    ASSESSMENT:    56year old male status post a left upper extremity wound debridement with VAC placement. S/[facility] left upper [facility]  Met with pt in room today. Pt reports good appetite and oral intake pta. Pt currently eating 100% of meals in hospital. Pt reports he drinks vanilla and butter pecan nepro but he does not take a daily MVI at home. RD discussed with pt the importance of adequate nutrition with HD and needed for wound healing. Suspect pt likely with scurvy r/t chronic HD, wounds, and decreased intake of fruits and vegetables. RD will order MVIs and supplements to encourage wound healing. Per chart, pt weight stable pta but is currently 34lbs above his UBW. Weight gain likely r/t fluid changes and edema.   Medications reviewed and include: Augmentin, calcitriol, epoetin, heparin, nicotine, renvela, oxycodone  Labs reviewed: Na 134(L), K 4.4 wnl, Cl 95(L), creat 4.90(H), Ca 8.7(L) P 5.0(H)- 11/28 Hgb 7.9(L), Hct 23.8(L) iPTH- 381.8(H)- 2012  Nutrition-Focused physical exam completed. Findings are mild fat depletions in arms and chest, mild muscle depletions over entire body, and moderate generalized edema.   Diet Order:  Diet renal with fluid restriction Fluid restriction: 1200 mL  Fluid; Room service appropriate? Yes; Fluid consistency: Thin Diet - low sodium heart healthy  EDUCATION NEEDS:   Education needs have been addressed  Skin:  Skin Assessment: (incision left arm )  Last BM:  12/6- type 7  Height:   Ht Readings from Last 1 Encounters:  06/15/17 _0  (1.753 m)    Weight:   Wt Readings from Last 1 Encounters:  06/15/17 201 lb 8 oz (91.4 kg)    Ideal Body Weight:  72.7 kg  BMI:  Body mass index is 29.76 kg/m.  Estimated Nutritional Needs:   Kcal:  1900-2200kcal/day   Protein:  99-114g/day   Fluid:  per MD  CKoleen DistanceMS, RD, LDN Pager #[phone] Hours Pager: [phone]

## 2017-06-17 NOTE — Progress Notes (Signed)
Pt continued having bleeding from left upper arm.Pooling in  Wound. Wound vac stopped. Wrapped in gauze, ABD pad, paper tape. Primary nurse notified Marcelle Overlie. Kimberly to round on pt this AM.

## 2017-06-17 NOTE — Discharge Instructions (Signed)
Wound VAC changes three times a week upon discharge.  Home health with RN Smoking cessation

## 2017-06-17 NOTE — Progress Notes (Signed)
[facility], [place] 06/17/17  Subjective:    Patient seen during dialysis Tolerating well   HEMODIALYSIS FLOWSHEET:  Blood Flow Rate (mL/min): 400 mL/min Arterial Pressure (mmHg): -160 mmHg Venous Pressure (mmHg): 130 mmHg Transmembrane Pressure (mmHg): 70 mmHg Ultrafiltration Rate (mL/min): 1000 mL/min Dialysate Flow Rate (mL/min): 800 ml/min Conductivity: Machine : 13.9 Conductivity: Machine : 13.9 Dialysis Fluid Bolus: Normal Saline Bolus Amount (mL): 250 mL    Objective:  Vital signs in last 24 hours:  Temp:  [97.7 F (36.5 C)-98 F (36.7 C)] 98 F (36.7 C) (12/07 1000) Pulse Rate:  [74-102] 94 (12/07 1315) Resp:  [16-19] 16 (12/07 1315) BP: (136-187)/(67-104) 172/80 (12/07 1315) SpO2:  [100 %] 100 % (12/07 1000) Weight:  [87 kg (191 lb 12.8 oz)] 87 kg (191 lb 12.8 oz) (12/07 1000)  Weight change:  Filed Weights   06/13/17 1703 06/15/17 1115 06/17/17 1000  Weight: 81.2 kg (179 lb 0.2 oz) 91.4 kg (201 lb 8 oz) 87 kg (191 lb 12.8 oz)    Intake/Output:    Intake/Output Summary (Last 24 hours) at 06/17/2017 1331 Last data filed at 06/17/2017 0900 Gross per 24 hour  Intake 360 ml  Output 330 ml  Net 30 ml     Physical Exam: General:  No acute distress, laying in the bed  HEENT  anicteric, moist oral mucous membranes  Neck  supple, distended neck veins  Pulm/lungs  normal breathing effort  CVS/Heart  regular rhythm, no rub  Abdomen:   Soft, nontender  Extremities:  + peripheral edema  Neurologic:  resting quietly  Skin:  No acute rashes  Access:  Right IJ PermCath, left upper arm AV fistula wound vac       Basic Metabolic Panel:  Recent Labs  Lab 06/12/17 1504 06/13/17 0333 06/13/17 0504 06/14/17 0936 06/16/17 0431  NA 126* 126*  --  130* 134*  K 5.8* 6.5* 6.2* 5.3* 4.4  CL 88* 87*  --  92* 95*  CO2 26 23  --  26 29  GLUCOSE 118* 101*  --  91 115*  BUN 43* 47*  --  28* 19  CREATININE 7.63* 8.00*  --  5.90*  4.90*  CALCIUM 8.8* 9.1  --  9.0 8.7*     CBC: Recent Labs  Lab 06/12/17 1504 06/13/17 0333 06/14/17 0936 06/16/17 0431 06/17/17 0441  WBC 10.3 10.4 8.9  --  5.3  NEUTROABS 7.5*  --   --   --   --   HGB 8.9* 9.0* 8.8* 8.4* 7.9*  HCT 26.5* 27.2* 26.8*  --  23.8*  MCV 87.5 86.4 86.2  --  84.9  PLT 144* 148* 184  --  213      Lab Results  Component Value Date   HEPBSAG Confirm. indicated 02/20/2017   HEPBSAB NEGATIVE 07/11/2011   HEPBIGM Negative 02/20/2017      Microbiology:  Recent Results (from the past 240 hour(s))  Aerobic/Anaerobic Culture (surgical/deep wound)     Status: None (Preliminary result)   Collection Time: 06/14/17  5:19 PM  Result Value Ref Range Status   Specimen Description ARM LEFT  Final   Special Requests NONE  Final   Gram Stain   Final    FEW WBC PRESENT, PREDOMINANTLY PMN NO ORGANISMS SEEN    Culture   Final    CULTURE REINCUBATED FOR BETTER GROWTH Performed at [facility], [address] [place], [place] [address]    Report Status PENDING  Incomplete    Coagulation Studies: No results for input(s): LABPROT, INR in the last 72 hours.  Urinalysis: No results for input(s): COLORURINE, LABSPEC, PHURINE, GLUCOSEU, HGBUR, BILIRUBINUR, KETONESUR, PROTEINUR, UROBILINOGEN, NITRITE, LEUKOCYTESUR in the last 72 hours.  Invalid input(s): APPERANCEUR    Imaging: No results found.   Medications:    . amLODipine  10 mg Oral Daily  . amoxicillin-clavulanate  1 tablet Oral Daily  . calcitRIOL  0.75 mcg Oral Q M,W,F-HD  . Chlorhexidine Gluconate Cloth  6 each Topical Q0600  . clopidogrel  75 mg Oral Daily  . epoetin (EPOGEN/PROCRIT) injection  10,000 Units Intravenous Q M,W,F-HD  . feeding supplement (NEPRO CARB STEADY)  237 mL Oral TID BM  . gabapentin  100 mg Oral BID  . heparin injection (subcutaneous)  5,000 Units Subcutaneous Q8H  . isosorbide mononitrate  30 mg Oral Daily  . multivitamin  1 tablet Oral QHS  .  multivitamin-lutein  1 capsule Oral Daily  . nicotine  14 mg Transdermal Daily  . sevelamer carbonate  3,200 mg Oral TID WC  . vitamin C  500 mg Oral BID   acetaminophen, albuterol, diphenhydrAMINE, hydrALAZINE, minoxidil, ondansetron, oxyCODONE, senna, simethicone, zolpidem  Assessment/ Plan:  56 y.o. African-American male with end stage renal disease on hemodialysis, left AVF hematoma status post evacuation on 11/21, ligation on 11/23 and 11/25 by Dr. Oneida Alar, Hypertension, chronic hepatitis C, chronic hepatitis B, COPD, GERD, substance abuse, depression/anxiety,congestive heart failurewho was [facility].  MWF [facility]  1. End Stage Renal Disease: on hemodialysis MWF.   Complication of hemodialysis device. Status post evacuation for hematoma and now with open wound. IV antibiotics.  As per primary team and vascular surgery - Surgical debridement-> wound vac placed Continue scheduled dialysis d/c planning in progress  2.  Anemia of chronic kidney disease: hemoglobin 7.9. Status post multiple units of PRBC transfusion - EPO with HD treatment.   3. Secondary Hyperparathyroidism:  - Sevelamer with meals  4. Edema - fluid removal with HD as tolerated    LOS: [place] 12/7/20181:31 PM  [facility] [place], [place]

## 2017-06-17 NOTE — Progress Notes (Signed)
Patient discharge education given. Patient verbalized understanding of discharge teaching. Sent home with wound vac to left arm and a box of remaining supplies for wound vac. Patient has home health set up for continuing wound care.

## 2017-06-18 ENCOUNTER — Inpatient Hospital Stay (HOSPITAL_COMMUNITY)
Admission: EM | Admit: 2017-06-18 | Discharge: 2017-06-23 | DRG: 190 | Disposition: A | Discharge status: Home Health | Payer: Medicare Other | Attending: Family Medicine | Admitting: Family Medicine

## 2017-06-18 ENCOUNTER — Other Ambulatory Visit: Payer: Self-pay

## 2017-06-18 ENCOUNTER — Emergency Department (HOSPITAL_COMMUNITY): Payer: Medicare Other

## 2017-06-18 ENCOUNTER — Encounter (HOSPITAL_COMMUNITY): Payer: Self-pay | Admitting: *Deleted

## 2017-06-18 ENCOUNTER — Other Ambulatory Visit (HOSPITAL_COMMUNITY): Payer: Self-pay

## 2017-06-18 DIAGNOSIS — N186 End stage renal disease: Secondary | ICD-10-CM

## 2017-06-18 DIAGNOSIS — Z992 Dependence on renal dialysis: Secondary | ICD-10-CM | POA: Diagnosis not present

## 2017-06-18 DIAGNOSIS — M19012 Primary osteoarthritis, left shoulder: Secondary | ICD-10-CM | POA: Diagnosis present

## 2017-06-18 DIAGNOSIS — Z886 Allergy status to analgesic agent status: Secondary | ICD-10-CM | POA: Diagnosis not present

## 2017-06-18 DIAGNOSIS — I4891 Unspecified atrial fibrillation: Secondary | ICD-10-CM | POA: Diagnosis present

## 2017-06-18 DIAGNOSIS — T85698A Other mechanical complication of other specified internal prosthetic devices, implants and grafts, initial encounter: Secondary | ICD-10-CM

## 2017-06-18 DIAGNOSIS — J441 Chronic obstructive pulmonary disease with (acute) exacerbation: Secondary | ICD-10-CM | POA: Diagnosis not present

## 2017-06-18 DIAGNOSIS — I120 Hypertensive chronic kidney disease with stage 5 chronic kidney disease or end stage renal disease: Secondary | ICD-10-CM | POA: Diagnosis present

## 2017-06-18 DIAGNOSIS — I4892 Unspecified atrial flutter: Secondary | ICD-10-CM | POA: Diagnosis not present

## 2017-06-18 DIAGNOSIS — E875 Hyperkalemia: Secondary | ICD-10-CM | POA: Diagnosis not present

## 2017-06-18 DIAGNOSIS — T827XXA Infection and inflammatory reaction due to other cardiac and vascular devices, implants and grafts, initial encounter: Secondary | ICD-10-CM | POA: Diagnosis present

## 2017-06-18 DIAGNOSIS — B1920 Unspecified viral hepatitis C without hepatic coma: Secondary | ICD-10-CM | POA: Diagnosis present

## 2017-06-18 DIAGNOSIS — D631 Anemia in chronic kidney disease: Secondary | ICD-10-CM | POA: Diagnosis not present

## 2017-06-18 DIAGNOSIS — Y832 Surgical operation with anastomosis, bypass or graft as the cause of abnormal reaction of the patient, or of later complication, without mention of misadventure at the time of the procedure: Secondary | ICD-10-CM | POA: Diagnosis present

## 2017-06-18 DIAGNOSIS — B181 Chronic viral hepatitis B without delta-agent: Secondary | ICD-10-CM | POA: Diagnosis present

## 2017-06-18 DIAGNOSIS — R0602 Shortness of breath: Secondary | ICD-10-CM | POA: Diagnosis not present

## 2017-06-18 DIAGNOSIS — I10 Essential (primary) hypertension: Secondary | ICD-10-CM | POA: Diagnosis present

## 2017-06-18 DIAGNOSIS — Z825 Family history of asthma and other chronic lower respiratory diseases: Secondary | ICD-10-CM | POA: Diagnosis not present

## 2017-06-18 DIAGNOSIS — F17210 Nicotine dependence, cigarettes, uncomplicated: Secondary | ICD-10-CM | POA: Diagnosis present

## 2017-06-18 DIAGNOSIS — I484 Atypical atrial flutter: Secondary | ICD-10-CM | POA: Diagnosis present

## 2017-06-18 DIAGNOSIS — R061 Stridor: Secondary | ICD-10-CM | POA: Diagnosis not present

## 2017-06-18 DIAGNOSIS — N189 Chronic kidney disease, unspecified: Secondary | ICD-10-CM | POA: Diagnosis present

## 2017-06-18 DIAGNOSIS — I700 Atherosclerosis of aorta: Secondary | ICD-10-CM | POA: Diagnosis present

## 2017-06-18 DIAGNOSIS — M898X9 Other specified disorders of bone, unspecified site: Secondary | ICD-10-CM | POA: Diagnosis present

## 2017-06-18 DIAGNOSIS — Z9114 Patient's other noncompliance with medication regimen: Secondary | ICD-10-CM

## 2017-06-18 DIAGNOSIS — Z86010 Personal history of colonic polyps: Secondary | ICD-10-CM

## 2017-06-18 DIAGNOSIS — Z800 Family history of malignant neoplasm of digestive organs: Secondary | ICD-10-CM | POA: Diagnosis not present

## 2017-06-18 DIAGNOSIS — F1410 Cocaine abuse, uncomplicated: Secondary | ICD-10-CM | POA: Diagnosis present

## 2017-06-18 DIAGNOSIS — I2510 Atherosclerotic heart disease of native coronary artery without angina pectoris: Secondary | ICD-10-CM | POA: Diagnosis present

## 2017-06-18 DIAGNOSIS — Z7902 Long term (current) use of antithrombotics/antiplatelets: Secondary | ICD-10-CM | POA: Diagnosis not present

## 2017-06-18 DIAGNOSIS — Z955 Presence of coronary angioplasty implant and graft: Secondary | ICD-10-CM

## 2017-06-18 DIAGNOSIS — Z888 Allergy status to other drugs, medicaments and biological substances status: Secondary | ICD-10-CM | POA: Diagnosis not present

## 2017-06-18 DIAGNOSIS — Z87442 Personal history of urinary calculi: Secondary | ICD-10-CM

## 2017-06-18 DIAGNOSIS — K219 Gastro-esophageal reflux disease without esophagitis: Secondary | ICD-10-CM | POA: Diagnosis present

## 2017-06-18 DIAGNOSIS — F419 Anxiety disorder, unspecified: Secondary | ICD-10-CM | POA: Diagnosis present

## 2017-06-18 DIAGNOSIS — J9601 Acute respiratory failure with hypoxia: Secondary | ICD-10-CM | POA: Diagnosis present

## 2017-06-18 DIAGNOSIS — N2581 Secondary hyperparathyroidism of renal origin: Secondary | ICD-10-CM | POA: Diagnosis not present

## 2017-06-18 DIAGNOSIS — I252 Old myocardial infarction: Secondary | ICD-10-CM

## 2017-06-18 DIAGNOSIS — Z808 Family history of malignant neoplasm of other organs or systems: Secondary | ICD-10-CM

## 2017-06-18 DIAGNOSIS — F1411 Cocaine abuse, in remission: Secondary | ICD-10-CM | POA: Diagnosis present

## 2017-06-18 DIAGNOSIS — Z801 Family history of malignant neoplasm of trachea, bronchus and lung: Secondary | ICD-10-CM | POA: Diagnosis not present

## 2017-06-18 DIAGNOSIS — R000 Tachycardia, unspecified: Secondary | ICD-10-CM | POA: Diagnosis present

## 2017-06-18 DIAGNOSIS — Z8249 Family history of ischemic heart disease and other diseases of the circulatory system: Secondary | ICD-10-CM | POA: Diagnosis not present

## 2017-06-18 LAB — MRSA PCR SCREENING: MRSA by PCR: NEGATIVE

## 2017-06-18 LAB — CBC WITH DIFFERENTIAL/PLATELET
Basophils Absolute: 0.1 10*3/uL (ref 0.0–0.1)
Basophils Relative: 1 %
Eosinophils Absolute: 0.2 10*3/uL (ref 0.0–0.7)
Eosinophils Relative: 2 %
HCT: 21 % — ABNORMAL LOW (ref 39.0–52.0)
Hemoglobin: 7 g/dL — ABNORMAL LOW (ref 13.0–17.0)
Lymphocytes Relative: 15 %
Lymphs Abs: 1 10*3/uL (ref 0.7–4.0)
MCH: 27.9 pg (ref 26.0–34.0)
MCHC: 33.3 g/dL (ref 30.0–36.0)
MCV: 83.7 fL (ref 78.0–100.0)
Monocytes Absolute: 0.7 10*3/uL (ref 0.1–1.0)
Monocytes Relative: 11 %
Neutro Abs: 4.7 10*3/uL (ref 1.7–7.7)
Neutrophils Relative %: 71 %
Platelets: 193 10*3/uL (ref 150–400)
RBC: 2.51 MIL/uL — ABNORMAL LOW (ref 4.22–5.81)
RDW: 19.1 % — ABNORMAL HIGH (ref 11.5–15.5)
WBC: 6.7 10*3/uL (ref 4.0–10.5)

## 2017-06-18 LAB — COMPREHENSIVE METABOLIC PANEL
ALT: 11 U/L — ABNORMAL LOW (ref 17–63)
AST: 24 U/L (ref 15–41)
Albumin: 2.3 g/dL — ABNORMAL LOW (ref 3.5–5.0)
Alkaline Phosphatase: 113 U/L (ref 38–126)
Anion gap: 9 (ref 5–15)
BUN: 15 mg/dL (ref 6–20)
CO2: 28 mmol/L (ref 22–32)
Calcium: 8.6 mg/dL — ABNORMAL LOW (ref 8.9–10.3)
Chloride: 94 mmol/L — ABNORMAL LOW (ref 101–111)
Creatinine, Ser: 5.68 mg/dL — ABNORMAL HIGH (ref 0.61–1.24)
GFR calc Af Amer: 12 mL/min — ABNORMAL LOW (ref >60)
GFR calc non Af Amer: 10 mL/min — ABNORMAL LOW (ref >60)
Glucose, Bld: 112 mg/dL — ABNORMAL HIGH (ref 65–99)
Potassium: 4.3 mmol/L (ref 3.5–5.1)
Sodium: 131 mmol/L — ABNORMAL LOW (ref 135–145)
Total Bilirubin: 0.7 mg/dL (ref 0.3–1.2)
Total Protein: 5.4 g/dL — ABNORMAL LOW (ref 6.5–8.1)

## 2017-06-18 LAB — HEPATITIS B SURFACE AG, CONFIRM: HBsAg Confirmation: POSITIVE — AB

## 2017-06-18 LAB — I-STAT CHEM 8, ED
BUN: 16 mg/dL (ref 6–20)
Calcium, Ion: 1.12 mmol/L — ABNORMAL LOW (ref 1.15–1.40)
Chloride: 93 mmol/L — ABNORMAL LOW (ref 101–111)
Creatinine, Ser: 5.4 mg/dL — ABNORMAL HIGH (ref 0.61–1.24)
Glucose, Bld: 109 mg/dL — ABNORMAL HIGH (ref 65–99)
HCT: 22 % — ABNORMAL LOW (ref 39.0–52.0)
Hemoglobin: 7.5 g/dL — ABNORMAL LOW (ref 13.0–17.0)
Potassium: 4.4 mmol/L (ref 3.5–5.1)
Sodium: 134 mmol/L — ABNORMAL LOW (ref 135–145)
TCO2: 30 mmol/L (ref 22–32)

## 2017-06-18 LAB — RESPIRATORY PANEL BY PCR

## 2017-06-18 LAB — HEPATITIS B CORE ANTIBODY, TOTAL: Hep B Core Total Ab: POSITIVE — AB

## 2017-06-18 LAB — PROCALCITONIN: Procalcitonin: 0.67 ng/mL

## 2017-06-18 LAB — I-STAT VENOUS BLOOD GAS, ED
Acid-Base Excess: 7 mmol/L — ABNORMAL HIGH (ref 0.0–2.0)
Bicarbonate: 31.1 mmol/L — ABNORMAL HIGH (ref 20.0–28.0)
O2 Saturation: 92 %
TCO2: 32 mmol/L (ref 22–32)
pCO2, Ven: 38.5 mmHg — ABNORMAL LOW (ref 44.0–60.0)
pH, Ven: 7.514 — ABNORMAL HIGH (ref 7.250–7.430)
pO2, Ven: 57 mmHg — ABNORMAL HIGH (ref 32.0–45.0)

## 2017-06-18 LAB — MAGNESIUM: Magnesium: 1.6 mg/dL — ABNORMAL LOW (ref 1.7–2.4)

## 2017-06-18 LAB — HEPATITIS B SURFACE ANTIBODY,QUALITATIVE: Hep B S Ab: NONREACTIVE

## 2017-06-18 LAB — HEPATITIS B SURFACE ANTIGEN

## 2017-06-18 LAB — I-STAT TROPONIN, ED: Troponin i, poc: 0.02 ng/mL (ref 0.00–0.08)

## 2017-06-18 MED ORDER — SEVELAMER CARBONATE 800 MG PO TABS
3200.0000 mg | ORAL_TABLET | Freq: Three times a day (TID) | ORAL | Status: DC
  Administered 2017-06-18 – 2017-06-23 (×15): 3200 mg via ORAL
  Filled 2017-06-18 (×15): qty 4

## 2017-06-18 MED ORDER — METHYLPREDNISOLONE SODIUM SUCC 125 MG IJ SOLR
125.0000 mg | Freq: Once | INTRAMUSCULAR | Status: AC
  Administered 2017-06-18: 125 mg via INTRAVENOUS
  Filled 2017-06-18: qty 2

## 2017-06-18 MED ORDER — RENA-VITE PO TABS
1.0000 | ORAL_TABLET | Freq: Every day | ORAL | Status: DC
  Administered 2017-06-18 – 2017-06-22 (×5): 1 via ORAL
  Filled 2017-06-18 (×5): qty 1

## 2017-06-18 MED ORDER — METOPROLOL TARTRATE 5 MG/5ML IV SOLN
10.0000 mg | Freq: Once | INTRAVENOUS | Status: AC
  Administered 2017-06-18: 10 mg via INTRAVENOUS
  Filled 2017-06-18: qty 10

## 2017-06-18 MED ORDER — GABAPENTIN 100 MG PO CAPS
100.0000 mg | ORAL_CAPSULE | Freq: Two times a day (BID) | ORAL | Status: DC
  Administered 2017-06-18 – 2017-06-23 (×9): 100 mg via ORAL
  Filled 2017-06-18 (×9): qty 1

## 2017-06-18 MED ORDER — CLOPIDOGREL BISULFATE 75 MG PO TABS: 75.0000 mg | ORAL_TABLET | Freq: Every day | ORAL | Status: DC

## 2017-06-18 MED ORDER — MINOXIDIL 10 MG PO TABS
10.0000 mg | ORAL_TABLET | Freq: Two times a day (BID) | ORAL | Status: DC | PRN
Start: 2017-06-18 — End: 2017-06-22
  Filled 2017-06-18: qty 1

## 2017-06-18 MED ORDER — LEVALBUTEROL HCL 0.63 MG/3ML IN NEBU
0.6300 mg | INHALATION_SOLUTION | Freq: Three times a day (TID) | RESPIRATORY_TRACT | Status: DC
  Filled 2017-06-18 (×2): qty 3

## 2017-06-18 MED ORDER — OCUVITE-LUTEIN PO CAPS: 1.0000 | ORAL_CAPSULE | Freq: Every day | ORAL | Status: DC

## 2017-06-18 MED ORDER — DILTIAZEM LOAD VIA INFUSION
20.0000 mg | Freq: Once | INTRAVENOUS | Status: AC
Start: 2017-06-18 — End: 2017-06-18
  Administered 2017-06-18: 20 mg via INTRAVENOUS
  Filled 2017-06-18: qty 20

## 2017-06-18 MED ORDER — ACETAMINOPHEN 650 MG RE SUPP: 650.0000 mg | Freq: Four times a day (QID) | RECTAL | Status: DC | PRN

## 2017-06-18 MED ORDER — METHYLPREDNISOLONE SODIUM SUCC 125 MG IJ SOLR
60.0000 mg | Freq: Four times a day (QID) | INTRAMUSCULAR | Status: DC
  Administered 2017-06-18 – 2017-06-19 (×3): 60 mg via INTRAVENOUS
  Filled 2017-06-18 (×3): qty 2

## 2017-06-18 MED ORDER — HEPARIN SODIUM (PORCINE) 5000 UNIT/ML IJ SOLN
5000.0000 [IU] | Freq: Three times a day (TID) | INTRAMUSCULAR | Status: DC
  Administered 2017-06-18: 5000 [IU] via SUBCUTANEOUS
  Filled 2017-06-18: qty 1

## 2017-06-18 MED ORDER — LEVALBUTEROL HCL 0.63 MG/3ML IN NEBU
0.6300 mg | INHALATION_SOLUTION | Freq: Four times a day (QID) | RESPIRATORY_TRACT | Status: DC | PRN
  Administered 2017-06-19: 0.63 mg via RESPIRATORY_TRACT
  Filled 2017-06-18: qty 3

## 2017-06-18 MED ORDER — MORPHINE SULFATE (PF) 4 MG/ML IV SOLN
4.0000 mg | Freq: Once | INTRAVENOUS | Status: AC
  Administered 2017-06-18: 4 mg via INTRAVENOUS
  Filled 2017-06-18: qty 1

## 2017-06-18 MED ORDER — ACETAMINOPHEN 325 MG PO TABS
650.0000 mg | ORAL_TABLET | Freq: Four times a day (QID) | ORAL | Status: DC | PRN
  Administered 2017-06-18 – 2017-06-23 (×9): 650 mg via ORAL
  Filled 2017-06-18 (×9): qty 2

## 2017-06-18 MED ORDER — DILTIAZEM HCL 100 MG IV SOLR
5.0000 mg/h | INTRAVENOUS | Status: DC
  Administered 2017-06-18 – 2017-06-19 (×2): 5 mg/h via INTRAVENOUS
  Filled 2017-06-18 (×2): qty 100

## 2017-06-18 MED ORDER — NICOTINE 14 MG/24HR TD PT24
14.0000 mg | MEDICATED_PATCH | Freq: Every day | TRANSDERMAL | Status: DC
  Administered 2017-06-18 – 2017-06-23 (×5): 14 mg via TRANSDERMAL
  Filled 2017-06-18 (×6): qty 1

## 2017-06-18 MED ORDER — IPRATROPIUM-ALBUTEROL 0.5-2.5 (3) MG/3ML IN SOLN
3.0000 mL | Freq: Once | RESPIRATORY_TRACT | Status: AC
  Administered 2017-06-18: 3 mL via RESPIRATORY_TRACT
  Filled 2017-06-18: qty 3

## 2017-06-18 MED ORDER — CALCITRIOL 0.25 MCG PO CAPS
0.7500 ug | ORAL_CAPSULE | ORAL | Status: DC
  Filled 2017-06-18 (×2): qty 1

## 2017-06-18 MED ORDER — SODIUM CHLORIDE 0.9% FLUSH
3.0000 mL | Freq: Two times a day (BID) | INTRAVENOUS | Status: DC
  Administered 2017-06-18 – 2017-06-23 (×9): 3 mL via INTRAVENOUS

## 2017-06-18 MED ORDER — ISOSORBIDE MONONITRATE ER 30 MG PO TB24
30.0000 mg | ORAL_TABLET | Freq: Every day | ORAL | Status: DC
  Administered 2017-06-18 – 2017-06-23 (×5): 30 mg via ORAL
  Filled 2017-06-18 (×5): qty 1

## 2017-06-18 MED ORDER — ISOSORBIDE MONONITRATE ER 30 MG PO TB24: 30.0000 mg | ORAL_TABLET | Freq: Every day | ORAL | Status: DC

## 2017-06-18 MED ORDER — CLOPIDOGREL BISULFATE 75 MG PO TABS
75.0000 mg | ORAL_TABLET | Freq: Every day | ORAL | Status: DC
  Administered 2017-06-18: 75 mg via ORAL
  Filled 2017-06-18: qty 1

## 2017-06-18 MED ORDER — NICOTINE 14 MG/24HR TD PT24: 14.0000 mg | MEDICATED_PATCH | Freq: Every day | TRANSDERMAL | Status: DC

## 2017-06-18 NOTE — Progress Notes (Signed)
RN attempted to complete the admission pt. Refuses to answer questions at this time.

## 2017-06-18 NOTE — Progress Notes (Signed)
Patient very agitated making multiple requests to remove the dressing to his left arm.  Pt. States "this damn thing needs to come off."  RN educated patient on importance of leaving dressing in place and the severe risk of bleeding if removed. Pt. Continues to be adamant that the dressing be removed repeatedly stating "call the damn doctor."  RN contacted Dr. Evangeline Gula and was directed to contact the physician who placed the original wound care order.  RN contacted NP Lissa Merlin and updated on patients behavior and request.  RN was directed to inform the patient that the dressing needs to remain in place due to the significant risk of bleeding if removed.  When the RN returned to the patients room to inform the patient the dressing needed to remain the patient was found to be shoving 4x4 gauze pads underneath the dressing and the arm was now bleeding from the top of the dressing where it had not been previously.  RN directed patient to stop disturbing the dressing and educated the patient on importance of not disturbing the dressing further and the increased risk of bleeding that the patient will have if he continued to do so, the patient verbalized understanding.  RN took ABD pads and kerlex and reinforced the dressing.  The patient stated "this is not what I wanted this is bullshit."  RN re-educated patient as to why the original dressing needs to remain in place, pt. Verbalized understanding of education.  RN will continue to monitor.

## 2017-06-18 NOTE — ED Notes (Signed)
Pt refuses to keep BP cuff on and O2 sat probe, pt encouraged to call for assistance when getting out of bed

## 2017-06-18 NOTE — ED Notes (Signed)
Pt ambulated to bathroom, pt reported to be bleeding in the bathroom, Shanon Brow, NT placed pressure on the area, & MD notified, Angela Nevin, RN with pt at this time assisting, MD notified that wound vac materials have been sent that are in this facility, Darl Householder, MD at bedside

## 2017-06-18 NOTE — ED Notes (Signed)
Pt continues to take off BP cuff & O2 sat probe

## 2017-06-18 NOTE — ED Notes (Signed)
Got patient on the monitor did ekg shown to er doctor 

## 2017-06-18 NOTE — ED Notes (Signed)
Darl Householder, MD placed guaze and tube guaze on L arm, secured with ace bandage, bleeding controlled

## 2017-06-18 NOTE — ED Notes (Signed)
Materials management called to check if there is an available connection to the wound vac and wound vac bandage sent to the depart

## 2017-06-18 NOTE — ED Notes (Signed)
Walked patient to the bathroom  

## 2017-06-18 NOTE — H&P (Signed)
History and Physical    Corrin Diaz HDQ:[id number] DOB: 05/27/1961 DOA: 06/18/2017   PCP: Joshua Mccreedy, MD   Attending physician: [name]  Patient coming from/Resides with: Private residence  Chief Complaint: Shortness of breath, malfunctioning wound VAC  HPI: [name] is a 56 y.o. male with medical history significant for CKD on HD (MWF), tobacco abuse and COPD, anemia of chronic disease, history of prior polysubstance abuse including cocaine abuse, hypertension, atrial flutter with prior DC CV but not on anticoagulation secondary to repeated issues with noncompliance and lack of commitment to follow-up.  This patient was recently discharged yesterday on 12/7 after a hospital admission at [facility].  The patient was initially admitted to behavioral health due to problems related to his anxiety and depression.  He developed symptoms consistent with an infected left forearm AV graft dialysis access that required I&D and placement of wound VAC.  Apparently the patient had done well after discharge until he started developing some bleeding from the wound site and the [facility] was not working properly.  He reports he became anxious and this prompted him to have a COPD exacerbation with increased shortness of breath and documented hypoxemia with O2 sats down to 86-88% in the field.  Patient also admits to smoking 3 cigarettes yesterday after returning home.  He has not had any fever and has had a nonproductive cough.  In the ER there were no wound VAC nurses available to replace the device and appropriate equipment comparable to patient's device was not available.  The EDP then placed surgical foam with coagulant onto the wound and wrapped the arm and a semi-occlusive dressing without any further bleeding noted.  Patient was also wheezing profusely upon presentation and required administration of DuoNeb and Solu-Medrol.  Chest x-ray did not reveal any infection and he  did not have leukocytosis or fever concerning for possible pneumonia process.  Since arrival to the ER the patient's tachycardia has worsened with ventricular rates in the 120-130 range.  EKG was consistent with atrial flutter.  Patient does have a history of atrial flutter but is not on anticoagulation as stated above.  Patient will be admitted for multiple problems including atrial flutter with RVR, COPD exacerbation with hypoxemia.  ED Course:  Vital Signs: BP (!) 167/86   Pulse (!) 130   Temp 99.4 F (37.4 C) (Oral)   Resp 19   Ht 5\' 9"  (1.753 m)   Wt 86.6 kg (191 lb)   SpO2 97%   BMI 28.21 kg/m  PCXR: Pulmonary vascular congestion with left basilar atelectasis Lab data: Sodium 131, potassium 4.3, chloride 94, CO2 28, glucose 112, BUN 15, creatinine 5.68, anion gap 9, albumin 2.3, total protein 5.4, POC troponin 0 0.02, white count 6700 with normal differential, hemoglobin 7, platelets 193,000 Medications and treatments: DuoNeb x1, Solu-Medrol 125 mg IV x1, morphine 4 mg IV x1  Review of Systems:  In addition to the HPI above,  No Fever-chills, myalgias or other constitutional symptoms No Headache, changes with Vision or hearing, new weakness, tingling, numbness in any extremity, dizziness, dysarthria or word finding difficulty, gait disturbance or imbalance, tremors or seizure activity No problems swallowing food or Liquids, indigestion/reflux, choking or coughing while eating, abdominal pain with or after eating No Chest pain, palpitations, orthopnea  No Abdominal pain, N/V, melena,hematochezia, dark tarry stools, constipation No dysuria, malodorous urine, hematuria or flank pain No new skin rashes, lesions, masses or bruises, No new joint pains, aches, swelling or redness  No recent unintentional weight gain or loss No polyuria, polydypsia or polyphagia   Past Medical History:  Diagnosis Date  . Adenomatous colon polyp    tubular  . Anemia   . Anxiety   . Arthritis    left  shoulder  . Atherosclerosis of aorta ([place])   . Cardiomegaly   . Chest pain    DATE UNKNOWN, C/O PERIODICALLY  . Chronic kidney disease    dialysis - M/W/F  . Cocaine abuse ([place])   . COPD exacerbation ([place]) 08/17/2016  . Dialysis patient ([place])    Monday-Wednesday-Friday  . GERD (gastroesophageal reflux disease)    DATE UNKNOWN  . Hemorrhoids   . Hepatitis B, chronic ([place])   . Hepatitis C   . Hyperkalemia   . Hypertension   . Metabolic bone disease    Patient denies  . Nephrolithiasis   . Pneumonia   . Pulmonary edema   . Renal disorder   . Renal insufficiency   . Shortness of breath dyspnea    " for the last past year with this dialysis"  . Tubular adenoma of colon     Past Surgical History:  Procedure Laterality Date  . A/V FISTULAGRAM Left 05/26/2017   Procedure: A/V FISTULAGRAM;  Surgeon: Joshua Bluejacket, MD;  Location: [employer] [facility];  Service: Cardiovascular;  Laterality: Left;  . APPLICATION OF WOUND VAC Left 06/14/2017   Procedure: APPLICATION OF WOUND VAC;  Surgeon: Joshua Cabal, MD;  Location: [facility] ORS;  Service: Vascular;  Laterality: Left;  . AV FISTULA PLACEMENT  2012   BELIEVED WAS PLACED IN JUNE  . COLONOSCOPY    . CORONARY STENT INTERVENTION N/A 02/22/2017   Procedure: CORONARY STENT INTERVENTION;  Surgeon: Joshua Mormon, MD;  Location: [place] CV LAB;  Service: Cardiovascular;  Laterality: N/A;  . HEMORRHOID BANDING    . I&D EXTREMITY Left 06/01/2017   Procedure: IRRIGATION AND DEBRIDEMENT LEFT ARM HEMATOMA WITH LIGATION OF LEFT ARM AV FISTULA;  Surgeon: Joshua Dutch, MD;  Location: [place];  Service: Vascular;  Laterality: Left;  . I&D EXTREMITY Left 06/14/2017   Procedure: IRRIGATION AND DEBRIDEMENT EXTREMITY;  Surgeon: Joshua Cabal, MD;  Location: [facility] OR[facility];  Service: Vascular;  Laterality: Left;  . INSERTION OF DIALYSIS CATHETER  05/30/2017  . INSERTION OF DIALYSIS CATHETER N/A 05/30/2017   Procedure: INSERTION OF DIALYSIS  CATHETER;  Surgeon: Joshua Dutch, MD;  Location: [facility] OR;  Service: Vascular;  Laterality: N/A;  . LEFT HEART CATH AND CORONARY ANGIOGRAPHY N/A 02/22/2017   Procedure: LEFT HEART CATH AND CORONARY ANGIOGRAPHY;  Surgeon: Joshua Mormon, MD;  Location: [place] CV LAB;  Service: Cardiovascular;  Laterality: N/A;  . LIGATION OF ARTERIOVENOUS  FISTULA Left 08/16/2351   Procedure: Plication of Left Arm Arteriovenous Fistula;  Surgeon: Joshua Dutch, MD;  Location: [place];  Service: Vascular;  Laterality: Left;  . POLYPECTOMY    . REVISON OF ARTERIOVENOUS FISTULA Left 12/23/4313   Procedure: PLICATION OF DISTAL ANEURYSMAL SEGEMENT OF LEFT UPPER ARM ARTERIOVENOUS FISTULA;  Surgeon: Joshua Dutch, MD;  Location: [place];  Service: Vascular;  Laterality: Left;  . REVISON OF ARTERIOVENOUS FISTULA Left 4/00/8676   Procedure: Plication of Left Upper Arm Fistula ;  Surgeon: Waynetta Sandy, MD;  Location: [place];  Service: Vascular;  Laterality: Left;  . THROMBECTOMY W/ EMBOLECTOMY Left 06/05/2017   Procedure: EXPLORATION OF LEFT ARM FOR BLEEDING; OVERSEWED PROXIMAL FISTULA;  Surgeon: Angelia Mould, MD;  Location: [facility] OR;  Service: Vascular;  Laterality: Left;  . WOUND EXPLORATION Left 06/03/2017   Procedure: WOUND EXPLORATION WITH WOUND VAC APPLICATION TO LEFT ARM;  Surgeon: Angelia Mould, MD;  Location: [facility] OR;  Service: Vascular;  Laterality: Left;    Social History   Socioeconomic History  . Marital status: Single    Spouse name: Not on file  . Number of children: 3  . Years of education: 10  . Highest education level: Not on file  Social Needs  . Financial resource strain: Not on file  . Food insecurity - worry: Not on file  . Food insecurity - inability: Not on file  . Transportation needs - medical: Not on file  . Transportation needs - non-medical: Not on file  Occupational History  . Occupation: Unemployed  Tobacco Use  . Smoking status: Current Every  Day Smoker    Packs/day: 0.50    Years: 43.00    Pack years: 21.50    Types: Cigarettes    Start date: 08/13/1973  . Smokeless tobacco: Never Used  . Tobacco comment: Less than 1/2 pk per day  Substance and Sexual Activity  . Alcohol use: Yes    Alcohol/week: 2.4 oz    Types: 1 Glasses of wine, 1 Cans of beer, 1 Shots of liquor, 1 Standard drinks or equivalent per week  . Drug use: Yes    Types: Marijuana, Cocaine  . Sexual activity: Not on file    Comment: "NOT LATELY" on cocaine  Other Topics Concern  . Not on file  Social History Narrative   Lives alone   Caffeine use: Coffee-rare   Soda- daily      [facility] (03/10/17):   Originally from [facility]. Previously worked trimming trees. No pets currently. No bird or mold exposure.     Mobility: Independent Work history: Disabled   Allergies  Allergen Reactions  . Aspirin Other (See Comments)    STOMACH PAIN  . Clonidine Derivatives Itching  . Tramadol Itching    Family History  Problem Relation Age of Onset  . Heart disease Mother   . Lung cancer Mother   . Heart disease Father   . Malignant hyperthermia Father   . COPD Father   . Throat cancer Sister   . Esophageal cancer Sister   . Hypertension Other   . COPD Other   . Colon cancer Neg Hx   . Colon polyps Neg Hx   . Rectal cancer Neg Hx   . Stomach cancer Neg Hx      Prior to Admission medications   Medication Sig Start Date End Date Taking? Authorizing Provider  acetaminophen (TYLENOL) 500 MG tablet Take 500-1,000 mg every 6 (six) hours as needed by mouth for headache (pain).     [provider]  albuterol (PROVENTIL HFA;VENTOLIN HFA) 108 (90 Base) MCG/ACT inhaler Inhale 2 puffs into the lungs every 6 (six) hours as needed for wheezing or shortness of breath. 03/10/17   Javier Glazier, MD  amLODipine (NORVASC) 10 MG tablet Take 1 tablet (10 mg total) by mouth daily. 06/17/17   [name], MD  amoxicillin-clavulanate (AUGMENTIN) 500-125 MG  tablet Take 1 tablet (500 mg total) by mouth daily. 06/17/17   [name], MD  calcitRIOL (ROCALTROL) 0.25 MCG capsule Take 3 capsules (0.75 mcg total) by mouth every Monday, Wednesday, and Friday with hemodialysis. 03/28/17   Cherene Altes, MD  clopidogrel (PLAVIX) 75 MG tablet Take one tablet (75mg ) daily. 04/07/17  [provider]  gabapentin (NEURONTIN) 100 MG capsule Take 100 mg 2 (two) times daily by mouth.    [provider]  isosorbide mononitrate (IMDUR) 30 MG 24 hr tablet Take 1 tablet (30 mg total) by mouth daily. 06/17/17   [name], MD  minoxidil (LONITEN) 10 MG tablet Take 10 mg 2 (two) times daily as needed by mouth (when blood pressure cuff reads "above normal").  05/02/17   [provider]  multivitamin (RENA-VIT) TABS tablet Take 1 tablet by mouth at bedtime. 06/17/17   [name], MD  multivitamin-lutein [facility]) CAPS capsule Take 1 capsule by mouth daily. 06/18/17   [name], MD  nicotine (NICODERM CQ - DOSED IN MG/24 HOURS) 14 mg/24hr patch Place 14 mg daily onto the skin. 03/30/17   [provider]  Nutritional Supplements (FEEDING SUPPLEMENT, NEPRO CARB STEADY,) LIQD Take 237 mLs by mouth 3 (three) times daily between meals. 06/17/17   [name], MD  ondansetron (ZOFRAN) 4 MG tablet Take 4 mg every 8 (eight) hours as needed by mouth for nausea or vomiting.  04/27/17   [provider]  oxycodone (OXY-IR) 5 MG capsule Take 5 mg 2 (two) times daily as needed by mouth for pain.    [provider]  senna (SENOKOT) 8.6 MG TABS tablet Take 1 tablet (8.6 mg total) by mouth daily as needed for mild constipation. 06/10/17   Mariel Aloe, MD  sevelamer carbonate (RENVELA) 800 MG tablet Take 4 tablets (3,200 mg total) by mouth 3 (three) times daily with meals. 06/10/17   Mariel Aloe, MD  Spacer/Aero-Holding Chambers (AEROCHAMBER MV) inhaler Use as instructed 03/10/17   Javier Glazier, MD  vitamin C (VITAMIN C) 500 MG  tablet Take 1 tablet (500 mg total) by mouth 2 (two) times daily. 06/17/17   [name], MD  zolpidem (AMBIEN) 10 MG tablet Take 1 tablet (10 mg total) by mouth at bedtime as needed for sleep. 06/10/17   Mariel Aloe, MD    Physical Exam: Vitals:   06/18/17 1330 06/18/17 1415 06/18/17 1500 06/18/17 1535  BP: 134/81 (!) 154/71 (!) 159/105 (!) 167/86  Pulse: (!) 130     Resp: 17 (!) 24 19 19   Temp:      TempSrc:      SpO2: 97%     Weight:      Height:          Constitutional: NAD, mildly anxious, comfortable Eyes: PERRL, lids and conjunctivae normal ENMT: Mucous membranes are moist. Posterior pharynx clear of any exudate or lesions.Normal dentition.  Neck: normal, supple, no masses, no thyromegaly Respiratory: clear to auscultation bilaterally, no wheezing, no crackles. Normal respiratory effort. No accessory muscle use.  Cardiovascular: Regular but tachycardic rate with underlying atrial flutter rhythm, no murmurs / rubs / gallops. No extremity edema. 2+ pedal pulses. No carotid bruits. R IJ HD access.  Abdomen: no tenderness, no masses palpated. No hepatosplenomegaly. Bowel sounds positive.  Musculoskeletal: no clubbing / cyanosis. No joint deformity upper and lower extremities. Good ROM, no contractures. Normal muscle tone.  Skin: no rashes, lesions, ulcers. No induration Neurologic: CN 2-12 grossly intact. Sensation intact, DTR normal. Strength 5/5 x all 4 extremities.  Psychiatric:  Alert and oriented x 3. Normal mood.    Labs on Admission: I have personally reviewed following labs and imaging studies  CBC: Recent Labs  Lab 06/12/17 1504 06/13/17 0333 06/14/17 0936 06/16/17 0431 06/17/17 0441 06/18/17 0929 06/18/17 0934  WBC 10.3  10.4 8.9  --  5.3 6.7  --   NEUTROABS 7.5*  --   --   --   --  4.7  --   HGB 8.9* 9.0* 8.8* 8.4* 7.9* 7.0* 7.5*  HCT 26.5* 27.2* 26.8*  --  23.8* 21.0* 22.0*  MCV 87.5 86.4 86.2  --  84.9 83.7  --   PLT 144* 148* 184  --  213 193  --     Basic Metabolic Panel: Recent Labs  Lab 06/12/17 1504 06/13/17 0333 06/13/17 0504 06/14/17 0936 06/16/17 0431 06/18/17 0929 06/18/17 0934  NA 126* 126*  --  130* 134* 131* 134*  K 5.8* 6.5* 6.2* 5.3* 4.4 4.3 4.4  CL 88* 87*  --  92* 95* 94* 93*  CO2 26 23  --  26 29 28   --   GLUCOSE 118* 101*  --  91 115* 112* 109*  BUN 43* 47*  --  28* 19 15 16   CREATININE 7.63* 8.00*  --  5.90* 4.90* 5.68* 5.40*  CALCIUM 8.8* 9.1  --  9.0 8.7* 8.6*  --    GFR: Estimated Creatinine Clearance: 17.1 mL/min (A) (by C-G formula based on SCr of 5.4 mg/dL (H)). Liver Function Tests: Recent Labs  Lab 06/18/17 0929  AST 24  ALT 11*  ALKPHOS 113  BILITOT 0.7  PROT 5.4*  ALBUMIN 2.3*   No results for input(s): LIPASE, AMYLASE in the last 168 hours. No results for input(s): AMMONIA in the last 168 hours. Coagulation Profile: Recent Labs  Lab 06/14/17 0936  INR 1.20   Cardiac Enzymes: No results for input(s): CKTOTAL, CKMB, CKMBINDEX, TROPONINI in the last 168 hours. BNP (last 3 results) No results for input(s): PROBNP in the last 8760 hours. HbA1C: No results for input(s): HGBA1C in the last 72 hours. CBG: No results for input(s): GLUCAP in the last 168 hours. Lipid Profile: No results for input(s): CHOL, HDL, LDLCALC, TRIG, CHOLHDL, LDLDIRECT in the last 72 hours. Thyroid Function Tests: No results for input(s): TSH, T4TOTAL, FREET4, T3FREE, THYROIDAB in the last 72 hours. Anemia Panel: No results for input(s): VITAMINB12, FOLATE, FERRITIN, TIBC, IRON, RETICCTPCT in the last 72 hours. Urine analysis:    Component Value Date/Time   COLORURINE YELLOW 07/16/2011 1619   APPEARANCEUR CLEAR 07/16/2011 1619   LABSPEC 1.018 07/16/2011 1619   PHURINE 7.5 07/16/2011 1619   GLUCOSEU 250 (A) 07/16/2011 1619   HGBUR MODERATE (A) 07/16/2011 1619   BILIRUBINUR SMALL (A) 07/16/2011 1619   KETONESUR NEGATIVE 07/16/2011 1619   PROTEINUR >300 (A) 07/16/2011 1619   UROBILINOGEN 1.0 07/16/2011  1619   NITRITE NEGATIVE 07/16/2011 1619   LEUKOCYTESUR SMALL (A) 07/16/2011 1619   Sepsis Labs: @LABRCNTIP (procalcitonin:4,lacticidven:4) ) Recent Results (from the past 240 hour(s))  Aerobic/Anaerobic Culture (surgical/deep wound)     Status: None (Preliminary result)   Collection Time: 06/14/17  5:19 PM  Result Value Ref Range Status   Specimen Description ARM LEFT  Final   Special Requests NONE  Final   Gram Stain   Final    FEW WBC PRESENT, PREDOMINANTLY PMN NO ORGANISMS SEEN Performed at [facility], [place] [address], [place],  [address]    Culture   Final    NORMAL SKIN FLORA NO ANAEROBES ISOLATED; CULTURE IN PROGRESS FOR 5 DAYS    Report Status PENDING  Incomplete     Radiological Exams on Admission: Dg Chest Port 1 View  Result Date: 06/18/2017 CLINICAL DATA:  56 year old male with shortness of  breath. EXAM: PORTABLE CHEST 1 VIEW COMPARISON:  05/30/2017 FINDINGS: Right-sided IJ central venous catheter appears in stable condition. The cardiomediastinal silhouette is enlarged but unchanged. There is pulmonary vascular congestion. Stable elevation of the left hemidiaphragm with associated atelectasis. Early developing infiltrate in this region is difficult to entirely exclude. The right lung is clear. No sizable pleural effusions or pneumothorax. No acute osseous abnormalities. IMPRESSION: Stable cardiomegaly with left basilar atelectasis versus developing infiltrate. Electronically Signed   By: Kristopher Oppenheim M.D.   On: 06/18/2017 09:09    EKG: (Independently reviewed) initial EKG: Atrial flutter with underlying left bundle branch block, ventricular rate 114 bpm, QTC 589 ms in the context of chronic left bundle branch block.  EKG #2: Atrial flutter with left bundle branch block with ventricular rate now 129 bpm, QTC 587 ms  Assessment/Plan Principal Problem:   Atrial flutter with rapid ventricular response  -Patient presents with wheezing and shortness of breath  in the context of COPD exacerbation -Initial EKG revealed atrial flutter with mildly tachycardic ventricular response of 114 bpm but after arrival has developed persistent tachycardia with ventricular rates as high as the 130s -History of known atrial flutter and prior DCCV but no longer on anticoagulation secondary to poor compliance with medication and lack of follow-up -CHADSVASC = 3 -We will give Lopressor 10 mg IV x1 followed by Cardizem 20 mg bolus followed by Cardizem infusion -History of COPD and uncertain if would be a candidate of amiodarone versus utilize rate control agent such as beta-blocker or calcium channel blocker instead of preadmission antihypertensive Norvasc -If unable to achieve rate control would consult cardiology Einar Gip)  Active Problems:   COPD exacerbation/Acute respiratory failure with hypoxia  -Patient developed shortness of breath and wheezing after becoming anxious over difficulties with wound VAC and bleeding at left AV graft wound site -Has responded well to oxygen, Solu-Medrol and nebs -Continue Solu-Medrol 60 mg IV every 6 hours -Given tachycardia utilize Xopenex scheduled nebs every 8 hours -No clinical signs of infection so no indication for antibiotics    CAD/NSTEMI August 2018 -Patient underwent left heart catheterization with stent placement to RCA by Dr. Einar Gip in August 2018 -Was also found to have severe pulmonary hypertension -Apparently has aspirin allergy -Continue Plavix, beta-blocker and statin    CKD (chronic kidney disease) stage V requiring chronic dialysis  -Dialyzes MWF -Does not appear volume overloaded but this may become an issue if unable to control underlying tachycardia -If patient remains in the hospital on Monday will need to consult nephrology for routine dialysis -Management of metabolic bone disease per nephrologist-I have reordered preadmission medications    Hypertension -Currently poorly controlled in the context of  tachycardia and anxiety -Have discontinued Norvasc in favor of calcium channel blocker as above -Continue iMDUR and minoxidil    Infection of AV graft for dialysis s/p recent I&D  -Presented with minor wound bleeding now controlled after coagulopathic/mildly occlusive dressing applied by EDP -WOC RN not available until Monday to adjust VAC noting patient's current equipment not compatible with supplies available in the ER    History of Cocaine abuse  -Reports no longer using -UDS    Anxiety -Consider adding BuSpar-avoid benzodiazepines    Anemia due to chronic kidney disease -Hemoglobin stable and at baseline between 7.5 and 8.5      DVT prophylaxis: SQ heparin Code Status: Full Family Communication: No family at bedside Disposition Plan: Home Consults called: None    ELLIS,ALLISON L. ANP-BC [facility] Pager [phone]  If 7PM-7AM, please contact night-coverage [email or web address] Password [email or web address]  06/18/2017, 3:40 PM

## 2017-06-18 NOTE — ED Notes (Signed)
Pt refuses rectal temperature

## 2017-06-18 NOTE — ED Notes (Signed)
Did a second ekg on patient shown to the attending dr patient is resting with call bell in reach

## 2017-06-18 NOTE — ED Provider Notes (Signed)
[facility] EMERGENCY DEPARTMENT Provider Note   CSN: [id number] Arrival date & time: 06/18/17  0825     History   Chief Complaint Chief Complaint  Patient presents with  . Shortness of Breath    HPI [name] is a 56 y.o. male with a history of end-stage renal disease (hemodialysis Monday/Wednesday/Friday), COPD, hep B, hep C, hypertension, hyperkalemia who presents to the emergency department today for shortness of breath and issues with wound vac.  He was discharged from [facility] yesterday. During stay the patient has I&D of left upper extremity for left brachiocephalic AV fistula with difficult cannulation and hematoma with surrounding cellulitis.  A wound VAC was placed over this area after the procedure. He was given IV zosyn and vancomycin while in the department and d/c on Augmentin 500mg  po daily.   Patient states he awoke at approximately 12 AM this morning and noticed that he had blood accumulating in the wound VAC site.  This made him very anxious.  Denies pain to the area.  He says when his anxiety becomes very short of breath and his COPD flares.  He notes that he has audible wheezing and has been using his albuterol inhaler at home approximately 5 times with mild relief still feels very short of breath.  Patient notes that this feels like prior COPD exacerbations.  He has been treated with steroids and duo nebs in the past with relief.  Most recent exacerbation on 11/18.  Patient is a current half pack day smoker with a 43-pack-year history.  Past abuser of marijuana and cocaine but denies using in the last 1 year.  Patient last received hemodialysis yesterday.  Patient notes that he does have chronic bilateral lower extremity edema. The patient denies fever, chill, body aches, chest pain. He is not on home O2.   HPI  Past Medical History:  Diagnosis Date  . Adenomatous colon polyp    tubular  . Anemia   . Anxiety    . Arthritis    left shoulder  . Atherosclerosis of aorta ([place])   . Cardiomegaly   . Chest pain    DATE UNKNOWN, C/O PERIODICALLY  . Chronic kidney disease    dialysis - M/W/F  . Cocaine abuse ([place])   . COPD exacerbation ([place]) 08/17/2016  . Dialysis patient ([place])    Monday-Wednesday-Friday  . GERD (gastroesophageal reflux disease)    DATE UNKNOWN  . Hemorrhoids   . Hepatitis B, chronic ([place])   . Hepatitis C   . Hyperkalemia   . Hypertension   . Metabolic bone disease    Patient denies  . Nephrolithiasis   . Pneumonia   . Pulmonary edema   . Renal disorder   . Renal insufficiency   . Shortness of breath dyspnea    " for the last past year with this dialysis"  . Tubular adenoma of colon     Patient Active Problem List   Diagnosis Date Noted  . Personality disorder ([place]) 06/13/2017  . Cellulitis 06/12/2017  . Adjustment disorder with mixed anxiety and depressed mood 06/10/2017  . Suicidal ideation 06/10/2017  . Arm wound, left, sequela 06/10/2017  . Dyspnea 05/29/2017  . Tachycardia 05/29/2017  . Hyperkalemia 05/22/2017  . Acute metabolic encephalopathy   . Anemia 04/23/2017  . Ascites 04/23/2017  . COPD (chronic obstructive pulmonary disease) ([place]) 04/23/2017  . Acute on chronic respiratory failure with hypoxia ([place]) 03/25/2017  . Atrial flutter ([place]) 03/25/2017  .  Arrhythmia 03/25/2017  . COPD GOLD II/ still smoking 09/27/2016  . Essential hypertension 09/27/2016  . Fluid overload 08/30/2016  . COPD exacerbation ([place]) 08/17/2016  . Hypertensive urgency 08/17/2016  . Respiratory failure ([place]) 08/17/2016  . Problem with dialysis access ([place]) 07/23/2016  . End stage renal disease ([place]) 12/02/2015  . Chronic hepatitis B ([place]) 03/05/2014  . Chronic hepatitis C without hepatic coma ([place]) 03/05/2014  . Internal hemorrhoids with bleeding, swelling and itching 03/05/2014  . Thrombocytopenia ([place]) 03/05/2014  . Chest pain 02/27/2014  . Alcohol abuse 04/14/2009  .  Tobacco use disorder 04/14/2009  . GANGLION CYST 04/14/2009    Past Surgical History:  Procedure Laterality Date  . A/V FISTULAGRAM Left 05/26/2017   Procedure: A/V FISTULAGRAM;  Surgeon: Conrad Winder, MD;  Location: [facility] CV LAB;  Service: Cardiovascular;  Laterality: Left;  . APPLICATION OF WOUND VAC Left 06/14/2017   Procedure: APPLICATION OF WOUND VAC;  Surgeon: Katha Cabal, MD;  Location: [facility] ORS;  Service: Vascular;  Laterality: Left;  . AV FISTULA PLACEMENT  2012   BELIEVED WAS PLACED IN JUNE  . COLONOSCOPY    . CORONARY STENT INTERVENTION N/A 02/22/2017   Procedure: CORONARY STENT INTERVENTION;  Surgeon: Nigel Mormon, MD;  Location: [place] [facility] LAB;  Service: Cardiovascular;  Laterality: N/A;  . HEMORRHOID BANDING    . I&D EXTREMITY Left 06/01/2017   Procedure: IRRIGATION AND DEBRIDEMENT LEFT ARM HEMATOMA WITH LIGATION OF LEFT ARM AV FISTULA;  Surgeon: Elam Dutch, MD;  Location: [facility];  Service: Vascular;  Laterality: Left;  . I&D EXTREMITY Left 06/14/2017   Procedure: IRRIGATION AND DEBRIDEMENT EXTREMITY;  Surgeon: Katha Cabal, MD;  Location: [facility] ORS;  Service: Vascular;  Laterality: Left;  . INSERTION OF DIALYSIS CATHETER  05/30/2017  . INSERTION OF DIALYSIS CATHETER N/A 05/30/2017   Procedure: INSERTION OF DIALYSIS CATHETER;  Surgeon: Elam Dutch, MD;  Location: [facility] OR;  Service: Vascular;  Laterality: N/A;  . LEFT HEART CATH AND CORONARY ANGIOGRAPHY N/A 02/22/2017   Procedure: LEFT HEART CATH AND CORONARY ANGIOGRAPHY;  Surgeon: Nigel Mormon, MD;  Location: [place] CV LAB;  Service: Cardiovascular;  Laterality: N/A;  . LIGATION OF ARTERIOVENOUS  FISTULA Left 09/14/1441   Procedure: Plication of Left Arm Arteriovenous Fistula;  Surgeon: Elam Dutch, MD;  Location: [facility];  Service: Vascular;  Laterality: Left;  . POLYPECTOMY    . REVISON OF ARTERIOVENOUS FISTULA Left 1/54/0086   Procedure: PLICATION OF DISTAL ANEURYSMAL  SEGEMENT OF LEFT UPPER ARM ARTERIOVENOUS FISTULA;  Surgeon: Elam Dutch, MD;  Location: [facility];  Service: Vascular;  Laterality: Left;  . REVISON OF ARTERIOVENOUS FISTULA Left 7/61/9509   Procedure: Plication of Left Upper Arm Fistula ;  Surgeon: Waynetta Sandy, MD;  Location: [facility];  Service: Vascular;  Laterality: Left;  . THROMBECTOMY W/ EMBOLECTOMY Left 06/05/2017   Procedure: EXPLORATION OF LEFT ARM FOR BLEEDING; OVERSEWED PROXIMAL FISTULA;  Surgeon: Angelia Mould, MD;  Location: [facility];  Service: Vascular;  Laterality: Left;  . WOUND EXPLORATION Left 06/03/2017   Procedure: WOUND EXPLORATION WITH WOUND VAC APPLICATION TO LEFT ARM;  Surgeon: Angelia Mould, MD;  Location: [facility];  Service: Vascular;  Laterality: Left;       Home Medications    Prior to Admission medications   Medication Sig Start Date End Date Taking? Authorizing Provider  acetaminophen (TYLENOL) 500 MG tablet Take 500-1,000 mg every 6 (six) hours as needed by mouth  for headache (pain).     [provider]  albuterol (PROVENTIL HFA;VENTOLIN HFA) 108 (90 Base) MCG/ACT inhaler Inhale 2 puffs into the lungs every 6 (six) hours as needed for wheezing or shortness of breath. 03/10/17   Javier Glazier, MD  amLODipine (NORVASC) 10 MG tablet Take 1 tablet (10 mg total) by mouth daily. 06/17/17   [name], MD  amoxicillin-clavulanate (AUGMENTIN) 500-125 MG tablet Take 1 tablet (500 mg total) by mouth daily. 06/17/17   [name], MD  calcitRIOL (ROCALTROL) 0.25 MCG capsule Take 3 capsules (0.75 mcg total) by mouth every Monday, Wednesday, and Friday with hemodialysis. 03/28/17   Cherene Altes, MD  clopidogrel (PLAVIX) 75 MG tablet Take one tablet (75mg ) daily. 04/07/17   [provider]  gabapentin (NEURONTIN) 100 MG capsule Take 100 mg 2 (two) times daily by mouth.    [provider]  isosorbide mononitrate (IMDUR) 30 MG 24 hr tablet Take 1 tablet (30 mg total) by  mouth daily. 06/17/17   [name], MD  minoxidil (LONITEN) 10 MG tablet Take 10 mg 2 (two) times daily as needed by mouth (when blood pressure cuff reads "above normal").  05/02/17   [provider]  multivitamin (RENA-VIT) TABS tablet Take 1 tablet by mouth at bedtime. 06/17/17   [name], MD  multivitamin-lutein [facility]) CAPS capsule Take 1 capsule by mouth daily. 06/18/17   [name], MD  nicotine (NICODERM CQ - DOSED IN MG/24 HOURS) 14 mg/24hr patch Place 14 mg daily onto the skin. 03/30/17   [provider]  Nutritional Supplements (FEEDING SUPPLEMENT, NEPRO CARB STEADY,) LIQD Take 237 mLs by mouth 3 (three) times daily between meals. 06/17/17   [name], MD  ondansetron (ZOFRAN) 4 MG tablet Take 4 mg every 8 (eight) hours as needed by mouth for nausea or vomiting.  04/27/17   [provider]  oxycodone (OXY-IR) 5 MG capsule Take 5 mg 2 (two) times daily as needed by mouth for pain.    [provider]  senna (SENOKOT) 8.6 MG TABS tablet Take 1 tablet (8.6 mg total) by mouth daily as needed for mild constipation. 06/10/17   Mariel Aloe, MD  sevelamer carbonate (RENVELA) 800 MG tablet Take 4 tablets (3,200 mg total) by mouth 3 (three) times daily with meals. 06/10/17   Mariel Aloe, MD  Spacer/Aero-Holding Chambers (AEROCHAMBER MV) inhaler Use as instructed 03/10/17   Javier Glazier, MD  vitamin C (VITAMIN C) 500 MG tablet Take 1 tablet (500 mg total) by mouth 2 (two) times daily. 06/17/17   [name], MD  zolpidem (AMBIEN) 10 MG tablet Take 1 tablet (10 mg total) by mouth at bedtime as needed for sleep. 06/10/17   Mariel Aloe, MD    Family History Family History  Problem Relation Age of Onset  . Heart disease Mother   . Lung cancer Mother   . Heart disease Father   . Malignant hyperthermia Father   . COPD Father   . Throat cancer Sister   . Esophageal cancer Sister   . Hypertension Other   . COPD Other   . Colon cancer Neg Hx     . Colon polyps Neg Hx   . Rectal cancer Neg Hx   . Stomach cancer Neg Hx     Social History Social History   Tobacco Use  . Smoking status: Current Every Day Smoker    Packs/day: 0.50    Years: 43.00    Pack  years: 21.50    Types: Cigarettes    Start date: 08/13/1973  . Smokeless tobacco: Never Used  . Tobacco comment: Less than 1/2 pk per day  Substance Use Topics  . Alcohol use: Yes    Alcohol/week: 2.4 oz    Types: 1 Glasses of wine, 1 Cans of beer, 1 Shots of liquor, 1 Standard drinks or equivalent per week  . Drug use: Yes    Types: Marijuana, Cocaine     Allergies   Aspirin; Clonidine derivatives; and Tramadol   Review of Systems Review of Systems  All other systems reviewed and are negative.    Physical Exam Updated Vital Signs BP (!) 165/91 (BP Location: Right Arm)   Pulse (!) 115   Temp 99.4 F (37.4 C) (Oral)   Resp 20   Ht 5\' 9"  (1.753 m)   Wt 86.6 kg (191 lb)   SpO2 99%   BMI 28.21 kg/m   Physical Exam  Constitutional: He appears well-developed and well-nourished.  HENT:  Head: Normocephalic and atraumatic.  Right Ear: External ear normal.  Left Ear: External ear normal.  Nose: Nose normal.  Mouth/Throat: Uvula is midline, oropharynx is clear and moist and mucous membranes are normal. No tonsillar exudate.  Eyes: Pupils are equal, round, and reactive to light. Right eye exhibits no discharge. Left eye exhibits no discharge. No scleral icterus.  Neck: Trachea normal and normal range of motion. Neck supple. JVD present. No spinous process tenderness present. No neck rigidity. Normal range of motion present.  Cardiovascular: Normal rate, regular rhythm and intact distal pulses.  No murmur heard. Pulses:      Radial pulses are 2+ on the right side, and 2+ on the left side.       Dorsalis pedis pulses are 2+ on the right side, and 2+ on the left side.       Posterior tibial pulses are 2+ on the right side, and 2+ on the left side.  Trace edema  to b/l lower extremities to pre-tibial region.   Pulmonary/Chest: Effort normal. No accessory muscle usage. No tachypnea and no bradypnea. No respiratory distress. He has no decreased breath sounds. He has wheezes (expiratory throughout). He has no rhonchi. He has no rales. He exhibits no tenderness.  Prolonged expiration.  Abdominal: Soft. Bowel sounds are normal. He exhibits distension. There is no rigidity, no rebound and no guarding.  Patient's abdomen is mildly distended suggestive of ascites.  It is without guarding.  No erythema.  Not severely tense or firm.   Musculoskeletal: He exhibits no edema.  Lymphadenopathy:    He has no cervical adenopathy.  Neurological: He is alert.  Skin: Skin is warm and dry. No rash noted. He is not diaphoretic.  Wound vac on left UE with hematoma to the left of negative pressure dressing. See picture.   Psychiatric: He has a normal mood and affect.  Nursing note and vitals reviewed.      ED Treatments / Results  Labs (all labs ordered are listed, but only abnormal results are displayed) Labs Reviewed  CBC WITH DIFFERENTIAL/PLATELET - Abnormal; Notable for the following components:      Result Value   RBC 2.51 (*)    Hemoglobin 7.0 (*)    HCT 21.0 (*)    RDW 19.1 (*)    All other components within normal limits  COMPREHENSIVE METABOLIC PANEL - Abnormal; Notable for the following components:   Sodium 131 (*)    Chloride 94 (*)  Glucose, Bld 112 (*)    Creatinine, Ser 5.68 (*)    Calcium 8.6 (*)    Total Protein 5.4 (*)    Albumin 2.3 (*)    ALT 11 (*)    GFR calc non Af Amer 10 (*)    GFR calc Af Amer 12 (*)    All other components within normal limits  I-STAT CHEM 8, ED - Abnormal; Notable for the following components:   Sodium 134 (*)    Chloride 93 (*)    Creatinine, Ser 5.40 (*)    Glucose, Bld 109 (*)    Calcium, Ion 1.12 (*)    Hemoglobin 7.5 (*)    HCT 22.0 (*)    All other components within normal limits  I-STAT VENOUS  BLOOD GAS, ED - Abnormal; Notable for the following components:   pH, Ven 7.514 (*)    pCO2, Ven 38.5 (*)    pO2, Ven 57.0 (*)    Bicarbonate 31.1 (*)    Acid-Base Excess 7.0 (*)    All other components within normal limits  BLOOD GAS, VENOUS  I-STAT TROPONIN, ED    EKG  EKG Interpretation  Date/Time:  Saturday June 18 2017 08:32:42 EST Ventricular Rate:  114 PR Interval:    QRS Duration: 165 QT Interval:  427 QTC Calculation: 589 R Axis:   -83 Text Interpretation:  Undetermined rhythm Left bundle branch block Confirmed by Tanna Furry [phone]) on 06/18/2017 8:38:12 AM       Radiology Dg Chest Port 1 View  Result Date: 06/18/2017 CLINICAL DATA:  56 year old male with shortness of breath. EXAM: PORTABLE CHEST 1 VIEW COMPARISON:  05/30/2017 FINDINGS: Right-sided IJ central venous catheter appears in stable condition. The cardiomediastinal silhouette is enlarged but unchanged. There is pulmonary vascular congestion. Stable elevation of the left hemidiaphragm with associated atelectasis. Early developing infiltrate in this region is difficult to entirely exclude. The right lung is clear. No sizable pleural effusions or pneumothorax. No acute osseous abnormalities. IMPRESSION: Stable cardiomegaly with left basilar atelectasis versus developing infiltrate. Electronically Signed   By: Kristopher Oppenheim M.D.   On: 06/18/2017 09:09    Procedures Procedures (including critical care time)  Medications Ordered in ED Medications - No data to display   Initial Impression / Assessment and Plan / ED Course  I have reviewed the triage vital signs and the nursing notes.  Pertinent labs & imaging results that were available during my care of the patient were reviewed by me and considered in my medical decision making (see chart for details).     56 year old male with a history of end-stage renal disease (M/W/F) presenting with hematoma of wound VAC site after recent I&D of left upper  extremity for left brachiocephalic AV fistula with difficult cannulation and hematoma with surrounding cellulitis.  Patient was discharged from [facility] yesterday.  He was given IV Zosyn and vancomycin while in the department and discharged home on Augmentin 500 mg.  Patient also presenting for shortness of breath no improvement of albuterol inhaler.  Patient noted to be tachycardic.  Possible low-grade fever at 99.4.  No tachypnea, or hypoxia.  On exam the patient has diffuse wheezing.  Wound VAC with hematoma next to negative compression dressing as above.  Will give DuoNeb and Solu-Medrol for suspected COPD exacerbation.  Consult placed for the wound nurse.  Labs, EKG, chest x-ray sent for evaluation of shortness of breath.  Discussed wound VAC to wound nurse Melody Liane Comber who advised that there is no  wound nurse here on the weekends.  She advised that the patient has advanced home care nursing based on the system that they are using.  Advised to consult case management and have them get in touch with the home health group to ensure that they will be able to visit the patient and replace the dressing today.  Advised that he has a negative pressure dressing that is no longer working.  He was advised that we can take the dressing down and apply a saline dressing if needed.  Consult placed to case management.  Chest x-ray shows left basilar atelectasis versus developing infiltrate.  Patient is afebrile in the department.  He is recently been on broad-spectrum antibiotics and currently on Augmentin.  He is without a white count.  This is more related to atelectasis versus pneumonia.  Wound dressing attempted to be changed by myself and Dr. Darl Householder.  It appears that we did not have the proper wound VAC attachments.  The dressing was taken down and replaced with Surgifoam, gauze and Ace bandage.   Patient without leukocytosis.  Troponin 0.02.  There is a drop from 7.9 to 7 of hemoglobin from yesterday with  questionable cause.  Mild hyponatremia at 131.  Potassium within normal limits.  Creatinine appears at baseline.  No anion gap acidosis.    After DuoNeb and Solu-Medrol the patient still feels short of breath.  He still wheezing and feels short of breath. He is now requiring O2 2L.  Will call for admission of COPD exacerbation.   Appreciate Dr. Evangeline Gula calling me back in regards to the patient and admit him.  Patient made aware of this and is in agreement with plan.   Final Clinical Impressions(s) / ED Diagnoses   Final diagnoses:  COPD exacerbation ([place])  Malfunction of vacuum-assisted closure [facility]) device, initial encounter    ED Discharge Orders    None       Lorelle Gibbs 06/18/17 1631    Drenda Freeze, MD 06/19/17 [phone]

## 2017-06-18 NOTE — ED Triage Notes (Addendum)
Pt in from home via [facility], per report pt called out for non working wound vac on L arm, pt seen here yesterday, pt c/o SOB, pt A&O x4, speaks in full sentences

## 2017-06-18 NOTE — ED Notes (Signed)
Lunch tray ordered; renal diet

## 2017-06-19 LAB — COMPREHENSIVE METABOLIC PANEL
ALT: 11 U/L — ABNORMAL LOW (ref 17–63)
AST: 24 U/L (ref 15–41)
Albumin: 2.4 g/dL — ABNORMAL LOW (ref 3.5–5.0)
Alkaline Phosphatase: 110 U/L (ref 38–126)
Anion gap: 11 (ref 5–15)
BUN: 25 mg/dL — ABNORMAL HIGH (ref 6–20)
CO2: 29 mmol/L (ref 22–32)
Calcium: 8.9 mg/dL (ref 8.9–10.3)
Chloride: 92 mmol/L — ABNORMAL LOW (ref 101–111)
Creatinine, Ser: 6.94 mg/dL — ABNORMAL HIGH (ref 0.61–1.24)
GFR calc Af Amer: 9 mL/min — ABNORMAL LOW (ref >60)
GFR calc non Af Amer: 8 mL/min — ABNORMAL LOW (ref >60)
Glucose, Bld: 165 mg/dL — ABNORMAL HIGH (ref 65–99)
Potassium: 5.2 mmol/L — ABNORMAL HIGH (ref 3.5–5.1)
Sodium: 132 mmol/L — ABNORMAL LOW (ref 135–145)
Total Bilirubin: 1.1 mg/dL (ref 0.3–1.2)
Total Protein: 5.4 g/dL — ABNORMAL LOW (ref 6.5–8.1)

## 2017-06-19 LAB — CBC
HCT: 19.5 % — ABNORMAL LOW (ref 39.0–52.0)
Hemoglobin: 6.5 g/dL — CL (ref 13.0–17.0)
MCH: 27.7 pg (ref 26.0–34.0)
MCHC: 33.3 g/dL (ref 30.0–36.0)
MCV: 83 fL (ref 78.0–100.0)
Platelets: 213 10*3/uL (ref 150–400)
RBC: 2.35 MIL/uL — ABNORMAL LOW (ref 4.22–5.81)
RDW: 18.9 % — ABNORMAL HIGH (ref 11.5–15.5)
WBC: 7.2 10*3/uL (ref 4.0–10.5)

## 2017-06-19 LAB — CBC WITH DIFFERENTIAL/PLATELET
Basophils Absolute: 0 10*3/uL (ref 0.0–0.1)
Basophils Relative: 0 %
Eosinophils Absolute: 0 10*3/uL (ref 0.0–0.7)
Eosinophils Relative: 0 %
HCT: 20.7 % — ABNORMAL LOW (ref 39.0–52.0)
Hemoglobin: 6.8 g/dL — CL (ref 13.0–17.0)
Lymphocytes Relative: 11 %
Lymphs Abs: 1.5 10*3/uL (ref 0.7–4.0)
MCH: 26.9 pg (ref 26.0–34.0)
MCHC: 32.9 g/dL (ref 30.0–36.0)
MCV: 81.8 fL (ref 78.0–100.0)
Monocytes Absolute: 0.8 10*3/uL (ref 0.1–1.0)
Monocytes Relative: 6 %
Neutro Abs: 11.3 10*3/uL — ABNORMAL HIGH (ref 1.7–7.7)
Neutrophils Relative %: 83 %
Platelets: 235 10*3/uL (ref 150–400)
RBC: 2.53 MIL/uL — ABNORMAL LOW (ref 4.22–5.81)
RDW: 20 % — ABNORMAL HIGH (ref 11.5–15.5)
WBC: 13.6 10*3/uL — ABNORMAL HIGH (ref 4.0–10.5)

## 2017-06-19 LAB — PREPARE RBC (CROSSMATCH)

## 2017-06-19 MED ORDER — SODIUM CHLORIDE 0.9 % IV SOLN: Freq: Once | INTRAVENOUS | Status: DC

## 2017-06-19 MED ORDER — DILTIAZEM HCL 30 MG PO TABS
30.0000 mg | ORAL_TABLET | Freq: Four times a day (QID) | ORAL | Status: DC
  Administered 2017-06-19 – 2017-06-23 (×17): 30 mg via ORAL
  Filled 2017-06-19 (×17): qty 1

## 2017-06-19 MED ORDER — OXYCODONE-ACETAMINOPHEN 5-325 MG PO TABS
1.0000 | ORAL_TABLET | Freq: Once | ORAL | Status: AC
  Administered 2017-06-20: 1 via ORAL
  Filled 2017-06-19: qty 1

## 2017-06-19 MED ORDER — METHYLPREDNISOLONE SODIUM SUCC 125 MG IJ SOLR
60.0000 mg | Freq: Two times a day (BID) | INTRAMUSCULAR | Status: DC
  Administered 2017-06-19 – 2017-06-21 (×3): 60 mg via INTRAVENOUS
  Filled 2017-06-19 (×3): qty 2

## 2017-06-19 MED ORDER — DARBEPOETIN ALFA 200 MCG/0.4ML IJ SOSY
200.0000 ug | PREFILLED_SYRINGE | INTRAMUSCULAR | Status: DC
  Administered 2017-06-20: 200 ug via INTRAVENOUS
  Filled 2017-06-19: qty 0.4

## 2017-06-19 MED ORDER — CAMPHOR-MENTHOL 0.5-0.5 % EX LOTN
TOPICAL_LOTION | CUTANEOUS | Status: DC | PRN
Start: 2017-06-19 — End: 2017-06-23
  Administered 2017-06-19: 16:00:00 via TOPICAL
  Filled 2017-06-19: qty 222

## 2017-06-19 MED ORDER — ZOLPIDEM TARTRATE 5 MG PO TABS
10.0000 mg | ORAL_TABLET | Freq: Every evening | ORAL | Status: DC | PRN
  Administered 2017-06-19 – 2017-06-22 (×6): 10 mg via ORAL
  Filled 2017-06-19 (×6): qty 2

## 2017-06-19 NOTE — Progress Notes (Signed)
Pt left UE still oozing blood. Changed and reinforced dressing x3 with surgifoam, gauze, and ace bandage. AM critical value Hbg 6.5. Made on-call doc aware. Will continue to monitor pt.

## 2017-06-19 NOTE — Progress Notes (Signed)
[facility] Progress Note  Subjective: pt was just dc'd a few days ago on 12/7 after admission at [facility] for LUA AVF related infiltration / hematoma which required exploration, hematoma evac and AVF ligation on 11/23 and 11/25.  He got 11 u prbc's.  He has some suicidal behavior / thoughts while at [facility] and was dc'd to [facility] on 11/30 for psych treatment. While there it appears he had debridement of some necrotic tissue from the L arm wound by the surgeons.  He was dc'd home by psych team on 12/7.  He presented the next day to [facility] ED with SOB, low O2 sat and said his wound VAC was not working.  He was admitted. Asked to see for ESRD.    Pt denies any SOB at this time. While here in Nov his peak wt was 85kg, and his dc wt on 11/30 was 76kg (prior dry wt was 72kg).  Admit weight here is 78 kg on 12/08.   CXR on 12/ 8 showed   He denies any prod cough, SOB or CP.  Using [facility] for HD.    Vitals:   06/19/17 1200 06/19/17 1300 06/19/17 1340 06/19/17 1600  BP:  (!) 141/73 (!) 161/84   Pulse:   (!) 108   Resp:  19 (!) 22 20  Temp:  98.1 F (36.7 C) 98.1 F (36.7 C) 98.1 F (36.7 C)  TempSrc:  Axillary Axillary Axillary  SpO2: 98% 98% 98% 98%  Weight:      Height:        Inpatient medications: . [START ON 06/20/2017] calcitRIOL  0.75 mcg Oral Q M,W,F-HD  . diltiazem  30 mg Oral Q6H  . gabapentin  100 mg Oral BID  . isosorbide mononitrate  30 mg Oral Daily  . methylPREDNISolone (SOLU-MEDROL) injection  60 mg Intravenous Q12H  . multivitamin  1 tablet Oral QHS  . nicotine  14 mg Transdermal Daily  . sevelamer carbonate  3,200 mg Oral TID WC  . sodium chloride flush  3 mL Intravenous Q12H   . sodium chloride     acetaminophen **OR** acetaminophen, camphor-menthol, levalbuterol, minoxidil, zolpidem  Exam: Alert, no distress, has lost body wt L upper arm in large bandage No jvd Chest dec'd BS throughout, no rales, occ scattered wheezes Cor regular, no RG Abd soft , less  distended now, nontender, no hsm Ext mild 1+ pretib edema bilat NF, ox 3  CXR 12/8 - no CHF, question LLL patchy infiltrate   Dialysis: [facility]  4h    2/2 bath  72.5kg (from early Nov)  Hep 3000   R IJ TDC -tsat 20%, Hb 9.4 early nov -mircera 200 ug last 11/14 -sensipar 120 w/ HD tiw       Impression: 1. AFib /RVR - per primary, on po dilt 2. SOB - not grossly vol overloaded, getting Rx for COPD w/ steroids, nebs 3. ESRD on HD MWF.  Hold hep with HD for now.  4. MBD of CKD - need to reassess meds, hasn't been to center in a month 5. Anemia of CKD/ ABL - Hb 8.5, getting 2u prbc, due to blood loss from L arm wound/ VAC. Resume ESA with HD tomorrow, check fe stores.  6. COPD 7. HTN - on po dilt, BP's ok, up a little 8. Volume - up 6kg if old edw is accurate, may have gained wt  Plan - HD Monday, UF 3 L as tol   [name] MD [facility] pager [phone]   06/19/2017, 6:49 PM   Recent Labs  Lab 06/16/17 0431 06/18/17 0929 06/18/17 0934 06/19/17 0259  NA 134* 131* 134* 132*  K 4.4 4.3 4.4 5.2*  CL 95* 94* 93* 92*  CO2 29 28  --  29  GLUCOSE 115* 112* 109* 165*  BUN 19 15 16  25*  CREATININE 4.90* 5.68* 5.40* 6.94*  CALCIUM 8.7* 8.6*  --  8.9   Recent Labs  Lab 06/18/17 0929 06/19/17 0259  AST 24 24  ALT 11* 11*  ALKPHOS 113 110  BILITOT 0.7 1.1  PROT 5.4* 5.4*  ALBUMIN 2.3* 2.4*   Recent Labs  Lab 06/17/17 0441 06/18/17 0929 06/18/17 0934 06/19/17 0259  WBC 5.3 6.7  --  7.2  NEUTROABS  --  4.7  --   --   HGB 7.9* 7.0* 7.5* 6.5*  HCT 23.8* 21.0* 22.0* 19.5*  MCV 84.9 83.7  --  83.0  PLT 213 193  --  213   Iron/TIBC/Ferritin/ %Sat    Component Value Date/Time   IRON 116 06/25/2011 0837   TIBC 277 06/25/2011 0837   FERRITIN 550 (H) 06/25/2011 0837   IRONPCTSAT 42 06/25/2011 6438

## 2017-06-19 NOTE — Progress Notes (Signed)
CRITICAL VALUE ALERT  Critical Value:  HMG 6.8  Date & Time Notied:  06/19/17 21:39  Provider Notified: Dr Hal Hope  Orders Received/Actions taken: Order for HMG repeat in the am

## 2017-06-19 NOTE — Progress Notes (Signed)
Referral received regarding pt's wound VAC- Pt is active with [facility] for [facility] services prior to admission- his home wound VAC is through [place]- pt will need to have wound VAC changed over to an in house [facility] either by nursing or by [facility] RN while he is INPT- then on discharge he can be placed back on [facility] home wound VAC by [facility] [place] nurses do not come in house to change dressings. - (please keep [place] home VAC at the bedside for use on discharge)

## 2017-06-19 NOTE — Evaluation (Signed)
Occupational Therapy Evaluation Patient Details Name: [name] MRN: [id number] DOB: Apr 29, 1961 Today's Date: 06/19/2017    History of Present Illness [name] is a 56 y.o. male who presents with atrial fibrillation with rapid ventricular response mild COPD exacerbation with hypoxemia; Prior to admission, he was at [facility] for anxiety, when he developed He developed symptoms consistent with an infected left forearm AV graft dialysis access that required I&D and placement of wound VAC   Clinical Impression   Pt reports he was independent with ADL PTA. Currently pt overall min guard for functional mobility and ADL with the exception of min assist for LB dressing. DOE 3/4 with mobility in room and increased edema in bil feet noted following mobility. Pt planning to d/c home alone. Recommending HHOT for follow up to maximize independence and safety with ADL and functional mobility upon return home. Pt would benefit from continued skilled OT to address established goals.    Follow Up Recommendations  Home health OT;Supervision - Intermittent    Equipment Recommendations  3 in 1 bedside commode(for shower chair)    Recommendations for Other Services PT consult     Precautions / Restrictions Precautions Precautions: Fall Restrictions Weight Bearing Restrictions: No      Mobility Bed Mobility Overal bed mobility: Modified Independent             General bed mobility comments: HOB elevated, increased time but no physical assist  Transfers Overall transfer level: Needs assistance Equipment used: None Transfers: Sit to/from Stand Sit to Stand: Supervision         General transfer comment: for safety    Balance Overall balance assessment: Needs assistance Sitting-balance support: Feet supported;No upper extremity supported Sitting balance-Leahy Scale: Good     Standing balance support: No upper extremity supported;During  functional activity Standing balance-Leahy Scale: Fair                             ADL either performed or assessed with clinical judgement   ADL Overall ADL's : Needs assistance/impaired Eating/Feeding: Set up;Sitting   Grooming: Min guard;Standing   Upper Body Bathing: Set up;Sitting   Lower Body Bathing: Min guard;Sit to/from stand   Upper Body Dressing : Set up;Sitting   Lower Body Dressing: Minimal assistance;Sit to/from stand Lower Body Dressing Details (indicate cue type and reason): to don socks Toilet Transfer: Min guard;Ambulation;Regular Toilet   Toileting- Water quality scientist and Hygiene: Supervision/safety;Sit to/from stand       Functional mobility during ADLs: Min guard General ADL Comments: DOE 3/4, bil feet edema noted following functional mobility in room for toilet transfer     Vision         Perception     Praxis      Pertinent Vitals/Pain       Hand Dominance Right   Extremity/Trunk Assessment Upper Extremity Assessment Upper Extremity Assessment: LUE deficits/detail;Overall WFL for tasks assessed LUE Deficits / Details: ace bandage from hand to shoulder   Lower Extremity Assessment Lower Extremity Assessment: Defer to PT evaluation   Cervical / Trunk Assessment Cervical / Trunk Assessment: Normal   Communication Communication Communication: No difficulties   Cognition Arousal/Alertness: Awake/alert Behavior During Therapy: WFL for tasks assessed/performed Overall Cognitive Status: Within Functional Limits for tasks assessed  General Comments       Exercises     Shoulder Instructions      Home Living Family/patient expects to be discharged to:: Private residence Living Arrangements: Alone   Type of Home: Apartment Home Access: Level entry     Home Layout: One level     Bathroom Shower/Tub: Tub/shower unit;Curtain   Biochemist, clinical: Standard      Home Equipment: Cane - single point          Prior Functioning/Environment Level of Independence: Independent with assistive device(s)        Comments: cane for mobility        OT Problem List: Decreased strength;Decreased activity tolerance;Impaired balance (sitting and/or standing);Decreased knowledge of use of DME or AE;Cardiopulmonary status limiting activity      OT Treatment/Interventions: Self-care/ADL training;Therapeutic exercise;Energy conservation;DME and/or AE instruction;Therapeutic activities;Patient/family education    OT Goals(Current goals can be found in the care plan section) Acute Rehab OT Goals Patient Stated Goal: take a nap OT Goal Formulation: With patient Time For Goal Achievement: 07/03/17 Potential to Achieve Goals: Good ADL Goals Pt Will Perform Grooming: with modified independence;standing Pt Will Perform Upper Body Bathing: with modified independence;sitting Pt Will Perform Lower Body Bathing: with modified independence;sit to/from stand Pt Will Transfer to Toilet: with modified independence;ambulating;regular height toilet Pt Will Perform Toileting - Clothing Manipulation and hygiene: with modified independence;sit to/from stand Pt Will Perform Tub/Shower Transfer: with modified independence;Tub transfer;ambulating;3 in 1  OT Frequency: Min 2X/week   Barriers to D/C: Decreased caregiver support  pt lives alone       Co-evaluation              AM-PAC PT "6 Clicks" Daily Activity     Outcome Measure Help from another person eating meals?: A Little Help from another person taking care of personal grooming?: A Little Help from another person toileting, which includes using toliet, bedpan, or urinal?: A Little Help from another person bathing (including washing, rinsing, drying)?: A Little Help from another person to put on and taking off regular upper body clothing?: A Little Help from another person to put on and taking off regular  lower body clothing?: A Little 6 Click Score: 18   End of Session Nurse Communication: Mobility status  Activity Tolerance: Patient tolerated treatment well Patient left: Other (comment)(sitting EOB with PT)  OT Visit Diagnosis: Unsteadiness on feet (R26.81);Other abnormalities of gait and mobility (R26.89)                Time: 3748-2707 OT Time Calculation (min): 21 min Charges:  OT General Charges $OT Visit: 1 Visit OT Evaluation $OT Eval Moderate Complexity: 1 Mod G-Codes:     Nicholaus Steinke A. Ulice Brilliant, M.S., OTR/L Pager: [place] 06/19/2017, 10:25 AM

## 2017-06-19 NOTE — Progress Notes (Signed)
PROGRESS NOTE    [name]  NTZ:[id number] DOB: October 13, 1960 DOA: 06/18/2017 PCP: [name], MD   Brief Narrative:  56 y.o. male who presents with atrial fibrillation with rapid ventricular response mild COPD exacerbation with hypoxemia.  Pt also had some bleeding from left arm which vascular surgery was consulted for.   Assessment & Plan:   Principal Problem:   Atrial flutter with rapid ventricular response ([place]) - Will transition to oral cardizem today. Orders placed - resolved with cardizem gtt  Anemia - Pt on room air with no accesory muscle use - Will plan on transfusing 1 unit of PRBC  Active Problems:   COPD exacerbation ([facility]) - Pt on solumedrol, no wheezes on exam. Will decrease solumedrol dose today.    Acute respiratory failure with hypoxia ([place]) -  Resolved patient on room air.    CKD (chronic kidney disease) stage V requiring chronic dialysis [facility]) - Nephrology consulted for management of dialysis    History of Cocaine abuse ([place])   Hypertension - Pt on cardizem, imdur    Bleeding for LUE - Vascular surgery consulted and on board.    Anxiety   - stable currently  DVT prophylaxis: early ambulation Code Status: Full Family Communication: none at bedside. Disposition Plan: pending improvement in condition   Consultants:   psychiatry   Procedures: none   Antimicrobials: none   Subjective: Pt has no new complaints reported today.  Objective: Vitals:   06/19/17 0400 06/19/17 0548 06/19/17 0800 06/19/17 1200  BP: (!) 158/78  (!) 150/79   Pulse: 94     Resp: 15  16   Temp: 98.1 F (36.7 C)  98.1 F (36.7 C)   TempSrc: Axillary  Axillary   SpO2: 98%  98% 98%  Weight:  78.4 kg (172 lb 13.5 oz)    Height:        Intake/Output Summary (Last 24 hours) at 06/19/2017 1247 Last data filed at 06/19/2017 1200 Gross per 24 hour  Intake 468.59 ml  Output 1 ml  Net 467.59 ml   Filed Weights   06/18/17 0830 06/18/17 1717  06/19/17 0548  Weight: 86.6 kg (191 lb) 76.8 kg (169 lb 5 oz) 78.4 kg (172 lb 13.5 oz)    Examination:  General exam: Appears calm and comfortable  Respiratory system:  Respiratory effort normal. No wheezes Cardiovascular system: no cyanosis, RRR. No JVD Gastrointestinal system: Abdomen is nondistended, soft and nontender. No organomegaly or masses felt. Normal bowel sounds heard. Central nervous system: Alert and oriented. No focal neurological deficits. Extremities: non cyanotic with LUE in ACE wrapp Skin: No rashes, lesions or ulcers, on limited exam. Psychiatry: Mood & affect appropriate.     Data Reviewed: I have personally reviewed following labs and imaging studies  CBC: Recent Labs  Lab 06/12/17 1504 06/13/17 0333 06/14/17 0936 06/16/17 0431 06/17/17 0441 06/18/17 0929 06/18/17 0934 06/19/17 0259  WBC 10.3 10.4 8.9  --  5.3 6.7  --  7.2  NEUTROABS 7.5*  --   --   --   --  4.7  --   --   HGB 8.9* 9.0* 8.8* 8.4* 7.9* 7.0* 7.5* 6.5*  HCT 26.5* 27.2* 26.8*  --  23.8* 21.0* 22.0* 19.5*  MCV 87.5 86.4 86.2  --  84.9 83.7  --  83.0  PLT 144* 148* 184  --  213 193  --  675   Basic Metabolic Panel: Recent Labs  Lab 06/13/17 0333  06/14/17 0936 06/16/17 0431 06/18/17  0932 06/18/17 0929 06/18/17 0934 06/19/17 0259  NA 126*  --  130* 134*  --  131* 134* 132*  K 6.5*   < > 5.3* 4.4  --  4.3 4.4 5.2*  CL 87*  --  92* 95*  --  94* 93* 92*  CO2 23  --  26 29  --  28  --  29  GLUCOSE 101*  --  91 115*  --  112* 109* 165*  BUN 47*  --  28* 19  --  15 16 25*  CREATININE 8.00*  --  5.90* 4.90*  --  5.68* 5.40* 6.94*  CALCIUM 9.1  --  9.0 8.7*  --  8.6*  --  8.9  MG  --   --   --   --  1.6*  --   --   --    < > = values in this interval not displayed.   GFR: Estimated Creatinine Clearance: 12.2 mL/min (A) (by C-G formula based on SCr of 6.94 mg/dL (H)). Liver Function Tests: Recent Labs  Lab 06/18/17 0929 06/19/17 0259  AST 24 24  ALT 11* 11*  ALKPHOS 113 110    BILITOT 0.7 1.1  PROT 5.4* 5.4*  ALBUMIN 2.3* 2.4*   No results for input(s): LIPASE, AMYLASE in the last 168 hours. No results for input(s): AMMONIA in the last 168 hours. Coagulation Profile: Recent Labs  Lab 06/14/17 0936  INR 1.20   Cardiac Enzymes: No results for input(s): CKTOTAL, CKMB, CKMBINDEX, TROPONINI in the last 168 hours. BNP (last 3 results) No results for input(s): PROBNP in the last 8760 hours. HbA1C: No results for input(s): HGBA1C in the last 72 hours. CBG: No results for input(s): GLUCAP in the last 168 hours. Lipid Profile: No results for input(s): CHOL, HDL, LDLCALC, TRIG, CHOLHDL, LDLDIRECT in the last 72 hours. Thyroid Function Tests: No results for input(s): TSH, T4TOTAL, FREET4, T3FREE, THYROIDAB in the last 72 hours. Anemia Panel: No results for input(s): VITAMINB12, FOLATE, FERRITIN, TIBC, IRON, RETICCTPCT in the last 72 hours. Sepsis Labs: Recent Labs  Lab 06/18/17 0925  PROCALCITON 0.67    Recent Results (from the past 240 hour(s))  Aerobic/Anaerobic Culture (surgical/deep wound)     Status: None (Preliminary result)   Collection Time: 06/14/17  5:19 PM  Result Value Ref Range Status   Specimen Description ARM LEFT  Final   Special Requests NONE  Final   Gram Stain   Final    FEW WBC PRESENT, PREDOMINANTLY PMN NO ORGANISMS SEEN Performed at [facility], [address] [place], [place] [address]    Culture   Final    NORMAL SKIN FLORA NO ANAEROBES ISOLATED; CULTURE IN PROGRESS FOR 5 DAYS    Report Status PENDING  Incomplete  Respiratory Panel by PCR     Status: None   Collection Time: 06/18/17  6:28 PM  Result Value Ref Range Status   Adenovirus NOT DETECTED NOT DETECTED Final   Coronavirus 229E NOT DETECTED NOT DETECTED Final   Coronavirus HKU1 NOT DETECTED NOT DETECTED Final   Coronavirus NL63 NOT DETECTED NOT DETECTED Final   Coronavirus OC43 NOT DETECTED NOT DETECTED Final   Metapneumovirus NOT DETECTED NOT DETECTED  Final   Rhinovirus / Enterovirus NOT DETECTED NOT DETECTED Final   Influenza A NOT DETECTED NOT DETECTED Final   Influenza B NOT DETECTED NOT DETECTED Final   Parainfluenza Virus 1 NOT DETECTED NOT DETECTED Final   Parainfluenza Virus 2 NOT DETECTED  NOT DETECTED Final   Parainfluenza Virus 3 NOT DETECTED NOT DETECTED Final   Parainfluenza Virus 4 NOT DETECTED NOT DETECTED Final   Respiratory Syncytial Virus NOT DETECTED NOT DETECTED Final   Bordetella pertussis NOT DETECTED NOT DETECTED Final   Chlamydophila pneumoniae NOT DETECTED NOT DETECTED Final   Mycoplasma pneumoniae NOT DETECTED NOT DETECTED Final  MRSA PCR Screening     Status: None   Collection Time: 06/18/17  6:28 PM  Result Value Ref Range Status   MRSA by PCR NEGATIVE NEGATIVE Final    Comment:        The GeneXpert MRSA Assay (FDA approved for NASAL specimens only), is one component of a comprehensive MRSA colonization surveillance program. It is not intended to diagnose MRSA infection nor to guide or monitor treatment for MRSA infections.      Radiology Studies: Dg Chest Port 1 View  Result Date: 06/18/2017 CLINICAL DATA:  56 year old male with shortness of breath. EXAM: PORTABLE CHEST 1 VIEW COMPARISON:  05/30/2017 FINDINGS: Right-sided IJ central venous catheter appears in stable condition. The cardiomediastinal silhouette is enlarged but unchanged. There is pulmonary vascular congestion. Stable elevation of the left hemidiaphragm with associated atelectasis. Early developing infiltrate in this region is difficult to entirely exclude. The right lung is clear. No sizable pleural effusions or pneumothorax. No acute osseous abnormalities. IMPRESSION: Stable cardiomegaly with left basilar atelectasis versus developing infiltrate. Electronically Signed   By: Kristopher Oppenheim M.D.   On: 06/18/2017 09:09    Scheduled Meds: . [START ON 06/20/2017] calcitRIOL  0.75 mcg Oral Q M,W,F-HD  . gabapentin  100 mg Oral BID  .  isosorbide mononitrate  30 mg Oral Daily  . methylPREDNISolone (SOLU-MEDROL) injection  60 mg Intravenous Q6H  . multivitamin  1 tablet Oral QHS  . nicotine  14 mg Transdermal Daily  . sevelamer carbonate  3,200 mg Oral TID WC  . sodium chloride flush  3 mL Intravenous Q12H   Continuous Infusions: . sodium chloride    . diltiazem (CARDIZEM) infusion 5 mg/hr (06/19/17 1200)     LOS: 1 day    Time spent: > 35 minutes  Velvet Bathe, MD [facility] Pager [phone]  If 7PM-7AM, please contact night-coverage [email or web address] Password [email or web address] 06/19/2017, 12:47 PM

## 2017-06-19 NOTE — Progress Notes (Deleted)
Pt is active with [facility] for [facility] services prior to admission- his home wound VAC is through [facility]- pt will need to have wound VAC changed over to an in house [facility] either by nursing or by [facility] RN while he is INPT- then on discharge he can be placed back on [facility] home wound VAC by [facility]- [place] nurses do not come in house to change dressings. - (please keep [facility] home VAC at the bedside for use on discharge)

## 2017-06-19 NOTE — Progress Notes (Addendum)
Referring Physician: [facility] hospitalist  Patient name: [name] MRN: [id number] DOB: 04/18/1961 Sex: male  REASON FOR CONSULT: bleeding left arm  HPI: [name] is a 56 y.o. male known to [place].  Prior hematoma left arm AV fistula.  Fistlula was ligated.  Pt had mulitple episodes of bleeding post op thought to be due to thrombocytopenia.  He was discharged to [facility] behavioral health.  While there his arm was debrided by Dr Delana Meyer 12/4.  He was sent home yesterday with a VAC.  He began to have bleeding last night and came back to [facility].  He was found to be in afib with RVR.    Past Medical History:  Diagnosis Date  . Adenomatous colon polyp    tubular  . Anemia   . Anxiety   . Arthritis    left shoulder  . Atherosclerosis of aorta ([place])   . Cardiomegaly   . Chest pain    DATE UNKNOWN, C/O PERIODICALLY  . Chronic kidney disease    dialysis - M/W/F  . Cocaine abuse ([place])   . COPD exacerbation ([place]) 08/17/2016  . Dialysis patient ([place])    Monday-Wednesday-Friday  . GERD (gastroesophageal reflux disease)    DATE UNKNOWN  . Hemorrhoids   . Hepatitis B, chronic ([place])   . Hepatitis C   . Hyperkalemia   . Hypertension   . Metabolic bone disease    Patient denies  . Nephrolithiasis   . Pneumonia   . Pulmonary edema   . Renal disorder   . Renal insufficiency   . Shortness of breath dyspnea    " for the last past year with this dialysis"  . Tubular adenoma of colon    Past Surgical History:  Procedure Laterality Date  . A/V FISTULAGRAM Left 05/26/2017   Procedure: A/V FISTULAGRAM;  Surgeon: Conrad Oak View, MD;  Location: [place] CV LAB;  Service: Cardiovascular;  Laterality: Left;  . APPLICATION OF WOUND VAC Left 06/14/2017   Procedure: APPLICATION OF WOUND VAC;  Surgeon: Katha Cabal, MD;  Location: [facility] ORS;  Service: Vascular;  Laterality: Left;  . AV FISTULA PLACEMENT  2012   BELIEVED WAS PLACED IN JUNE  . COLONOSCOPY    .  CORONARY STENT INTERVENTION N/A 02/22/2017   Procedure: CORONARY STENT INTERVENTION;  Surgeon: Nigel Mormon, MD;  Location: [place] [facility] LAB;  Service: Cardiovascular;  Laterality: N/A;  . HEMORRHOID BANDING    . I&D EXTREMITY Left 06/01/2017   Procedure: IRRIGATION AND DEBRIDEMENT LEFT ARM HEMATOMA WITH LIGATION OF LEFT ARM AV FISTULA;  Surgeon: Elam Dutch, MD;  Location: [place];  Service: Vascular;  Laterality: Left;  . I&D EXTREMITY Left 06/14/2017   Procedure: IRRIGATION AND DEBRIDEMENT EXTREMITY;  Surgeon: Katha Cabal, MD;  Location: [facility] ORS;  Service: Vascular;  Laterality: Left;  . INSERTION OF DIALYSIS CATHETER  05/30/2017  . INSERTION OF DIALYSIS CATHETER N/A 05/30/2017   Procedure: INSERTION OF DIALYSIS CATHETER;  Surgeon: Elam Dutch, MD;  Location: [facility] OR;  Service: Vascular;  Laterality: N/A;  . LEFT HEART CATH AND CORONARY ANGIOGRAPHY N/A 02/22/2017   Procedure: LEFT HEART CATH AND CORONARY ANGIOGRAPHY;  Surgeon: Nigel Mormon, MD;  Location: [place] CV LAB;  Service: Cardiovascular;  Laterality: N/A;  . LIGATION OF ARTERIOVENOUS  FISTULA Left 4/0/9811   Procedure: Plication of Left Arm Arteriovenous Fistula;  Surgeon: Elam Dutch, MD;  Location: [place];  Service: Vascular;  Laterality: Left;  .  POLYPECTOMY    . REVISON OF ARTERIOVENOUS FISTULA Left 5/63/8937   Procedure: PLICATION OF DISTAL ANEURYSMAL SEGEMENT OF LEFT UPPER ARM ARTERIOVENOUS FISTULA;  Surgeon: Elam Dutch, MD;  Location: [place];  Service: Vascular;  Laterality: Left;  . REVISON OF ARTERIOVENOUS FISTULA Left 3/42/8768   Procedure: Plication of Left Upper Arm Fistula ;  Surgeon: Waynetta Sandy, MD;  Location: [place];  Service: Vascular;  Laterality: Left;  . THROMBECTOMY W/ EMBOLECTOMY Left 06/05/2017   Procedure: EXPLORATION OF LEFT ARM FOR BLEEDING; OVERSEWED PROXIMAL FISTULA;  Surgeon: Angelia Mould, MD;  Location: [place];  Service: Vascular;   Laterality: Left;  . WOUND EXPLORATION Left 06/03/2017   Procedure: WOUND EXPLORATION WITH WOUND VAC APPLICATION TO LEFT ARM;  Surgeon: Angelia Mould, MD;  [facility] OR;  Service: Vascular;  Laterality: Left;    Family History  Problem Relation Age of Onset  . Heart disease Mother   . Lung cancer Mother   . Heart disease Father   . Malignant hyperthermia Father   . COPD Father   . Throat cancer Sister   . Esophageal cancer Sister   . Hypertension Other   . COPD Other   . Colon cancer Neg Hx   . Colon polyps Neg Hx   . Rectal cancer Neg Hx   . Stomach cancer Neg Hx     SOCIAL HISTORY: Social History   Socioeconomic History  . Marital status: Single    Spouse name: Not on file  . Number of children: 3  . Years of education: 10  . Highest education level: Not on file  Social Needs  . Financial resource strain: Not on file  . Food insecurity - worry: Not on file  . Food insecurity - inability: Not on file  . Transportation needs - medical: Not on file  . Transportation needs - non-medical: Not on file  Occupational History  . Occupation: Unemployed  Tobacco Use  . Smoking status: Current Every Day Smoker    Packs/day: 0.50    Years: 43.00    Pack years: 21.50    Types: Cigarettes    Start date: 08/13/1973  . Smokeless tobacco: Never Used  . Tobacco comment: Less than 1/2 pk per day  Substance and Sexual Activity  . Alcohol use: Yes    Alcohol/week: 2.4 oz    Types: 1 Glasses of wine, 1 Cans of beer, 1 Shots of liquor, 1 Standard drinks or equivalent per week  . Drug use: Yes    Types: Marijuana, Cocaine  . Sexual activity: Not on file    Comment: "NOT LATELY" on cocaine  Other Topics Concern  . Not on file  Social History Narrative   Lives alone   Caffeine use: Coffee-rare   Soda- daily      [facility] (03/10/17):   Originally from [facility]. Previously worked trimming trees. No pets currently. No bird or mold exposure.     Allergies  Allergen  Reactions  . Aspirin Other (See Comments)    STOMACH PAIN  . Clonidine Derivatives Itching  . Tramadol Itching    Current Facility-Administered Medications  Medication Dose Route Frequency Provider Last Rate Last Dose  . acetaminophen (TYLENOL) tablet 650 mg  650 mg Oral Q6H PRN Samella Parr, NP   650 mg at 06/18/17 2127   Or  . acetaminophen (TYLENOL) suppository 650 mg  650 mg Rectal Q6H PRN Samella Parr, NP      . Derrill Memo ON  06/20/2017] calcitRIOL (ROCALTROL) capsule 0.75 mcg  0.75 mcg Oral Q M,W,F-HD Samella Parr, NP      . diltiazem (CARDIZEM) 100 mg in dextrose 5 % 100 mL (1 mg/mL) infusion  5-15 mg/hr Intravenous Titrated Samella Parr, NP 5 mL/hr at 06/19/17 0507 5 mg/hr at 06/19/17 0507  . gabapentin (NEURONTIN) capsule 100 mg  100 mg Oral BID Samella Parr, NP   100 mg at 06/18/17 2115  . isosorbide mononitrate (IMDUR) 24 hr tablet 30 mg  30 mg Oral Daily [name], MD   30 mg at 06/18/17 1837  . levalbuterol (XOPENEX) nebulizer solution 0.63 mg  0.63 mg Nebulization Q6H PRN [name], MD      . methylPREDNISolone sodium succinate (SOLU-MEDROL) 125 mg/2 mL injection 60 mg  60 mg Intravenous Q6H Samella Parr, NP   60 mg at 06/19/17 0108  . minoxidil (LONITEN) tablet 10 mg  10 mg Oral BID PRN Samella Parr, NP      . multivitamin (RENA-VIT) tablet 1 tablet  1 tablet Oral QHS Samella Parr, NP   1 tablet at 06/18/17 2116  . nicotine (NICODERM CQ - dosed in mg/24 hours) patch 14 mg  14 mg Transdermal Daily [name], MD   14 mg at 06/18/17 1837  . sevelamer carbonate (RENVELA) tablet 3,200 mg  3,200 mg Oral TID WC Samella Parr, NP   3,200 mg at 06/18/17 1837  . sodium chloride flush (NS) 0.9 % injection 3 mL  3 mL Intravenous Q12H Samella Parr, NP   3 mL at 06/18/17 2124  . zolpidem (AMBIEN) tablet 10 mg  10 mg Oral QHS PRN Rise Patience, MD   10 mg at 06/19/17 0108     Physical Examination  Vitals:   06/18/17  2024 06/18/17 2327 06/19/17 0400 06/19/17 0548  BP: (!) 171/82 (!) 155/72 (!) 158/78   Pulse: 95 91 94   Resp: 19 17 15    Temp: (!) 97.5 F (36.4 C) 97.6 F (36.4 C) 98.1 F (36.7 C)   TempSrc: Axillary Axillary Axillary   SpO2: 99% 98% 98%   Weight:    172 lb 13.5 oz (78.4 kg)  Height:        Body mass index is 25.52 kg/m.  General:  Alert and oriented, no acute distress Skin: No rash, open wound 10 x 7 cm with some early granulation tissue.  <1 mm area of raw surface oozing controlled with direct pressure  DATA:  CBC    Component Value Date/Time   WBC 7.2 06/19/2017 0259   RBC 2.35 (L) 06/19/2017 0259   HGB 6.5 (LL) 06/19/2017 0259   HCT 19.5 (L) 06/19/2017 0259   PLT 213 06/19/2017 0259   MCV 83.0 06/19/2017 0259   MCH 27.7 06/19/2017 0259   MCHC 33.3 06/19/2017 0259   RDW 18.9 (H) 06/19/2017 0259   LYMPHSABS 1.0 06/18/2017 0929   MONOABS 0.7 06/18/2017 0929   EOSABS 0.2 06/18/2017 0929   BASOSABS 0.1 06/18/2017 0929    BMET    Component Value Date/Time   NA 132 (L) 06/19/2017 0259   K 5.2 (H) 06/19/2017 0259   CL 92 (L) 06/19/2017 0259   CO2 29 06/19/2017 0259   GLUCOSE 165 (H) 06/19/2017 0259   BUN 25 (H) 06/19/2017 0259   CREATININE 6.94 (H) 06/19/2017 0259   CREATININE 9.18 (H) 02/15/2017 1425   CALCIUM 8.9 06/19/2017 0259   CALCIUM 8.1 (L) 07/11/2011  [place] (L) 06/19/2017 0259   GFRNONAA 6 (L) 02/15/2017 1425   GFRAA 9 (L) 06/19/2017 0259   GFRAA 7 (L) 02/15/2017 1425     ASSESSMENT:  Bleeding from raw surface from prior debridement.     PLAN:  Would keep wound moist to avoid bleeding stirred up by adherent dressing Hydrogel 4 x4 kerlix ACE once daily  Avoid ALL anticoagulation for now.  This includes antiplatelet and pharmacologic DVT prophylaxis.  Can use SCDs   Ruta Hinds, MD [facility] Office: [phone] Pager: [phone]

## 2017-06-19 NOTE — Evaluation (Signed)
Physical Therapy Evaluation Patient Details Name: [name] MRN: [id number] DOB: 1961/04/01 Today's Date: 06/19/2017   History of Present Illness  [name] is a 56 y.o. male who presents with atrial fibrillation with rapid ventricular response mild COPD exacerbation with hypoxemia; Prior to admission, he was at [facility] for anxiety, when he developed He developed symptoms consistent with an infected left forearm AV graft dialysis access that required I&D and placement of wound VAC  Clinical Impression   Pt admitted with above diagnosis. Pt currently with functional limitations due to the deficits listed below (see PT Problem List). Limited PT eval this date as [name] adamantly declined most mobility and amb, just wanting to nap; Of note, his feet tend to swell with even short distances of ambulation; We discussed elevating LEs when at rest to help with swelling; Hopeful for more activity next session;  Pt will benefit from skilled PT to increase their independence and safety with mobility to allow discharge to the venue listed below.       Follow Up Recommendations Home health PT;Other (comment)(I anticipate he will decline HHPT)    Equipment Recommendations  Other (comment)(to be determined)    Recommendations for Other Services       Precautions / Restrictions Precautions Precautions: Fall Restrictions Weight Bearing Restrictions: No      Mobility  Bed Mobility Overal bed mobility: Modified Independent             General bed mobility comments: No difficulty layin back down to bed  Transfers Overall transfer level: Needs assistance Equipment used: None Transfers: Sit to/from Stand Sit to Stand: Supervision         General transfer comment: Declined transfers   Ambulation/Gait             General Gait Details: Declined amb; had just walked from bathroom back to bed  Stairs            Wheelchair  Mobility    Modified Rankin (Stroke Patients Only)       Balance Overall balance assessment: Needs assistance Sitting-balance support: Feet supported;No upper extremity supported Sitting balance-Leahy Scale: Good     Standing balance support: No upper extremity supported;During functional activity Standing balance-Leahy Scale: (not observed on PT eval)                               Pertinent Vitals/Pain Pain Assessment: No/denies pain Faces Pain Scale: No hurt    Home Living Family/patient expects to be discharged to:: Private residence Living Arrangements: Alone   Type of Home: Apartment Home Access: Level entry     Home Layout: One level Home Equipment: Cane - single point      Prior Function Level of Independence: Independent with assistive device(s)         Comments: cane for mobility     Hand Dominance   Dominant Hand: Right    Extremity/Trunk Assessment   Upper Extremity Assessment Upper Extremity Assessment: Defer to OT evaluation LUE Deficits / Details: ace bandage from hand to shoulder    Lower Extremity Assessment Lower Extremity Assessment: Generalized weakness    Cervical / Trunk Assessment Cervical / Trunk Assessment: Normal  Communication   Communication: No difficulties  Cognition Arousal/Alertness: Awake/alert Behavior During Therapy: WFL for tasks assessed/performed(though not wanting to participate) Overall Cognitive Status: Within Functional Limits for tasks assessed  General Comments      Exercises     Assessment/Plan    PT Assessment Patient needs continued PT services  PT Problem List Decreased strength;Decreased range of motion;Decreased activity tolerance;Decreased balance;Decreased mobility;Decreased knowledge of use of DME       PT Treatment Interventions DME instruction;Gait training;Stair training;Functional mobility training;Therapeutic  activities;Therapeutic exercise;Patient/family education    PT Goals (Current goals can be found in the Care Plan section)  Acute Rehab PT Goals Patient Stated Goal: take a nap PT Goal Formulation: With patient Time For Goal Achievement: 07/03/17 Potential to Achieve Goals: Good    Frequency Min 3X/week   Barriers to discharge Decreased caregiver support Can his son give assistance at home?    Co-evaluation               AM-PAC PT "6 Clicks" Daily Activity  Outcome Measure Difficulty turning over in bed (including adjusting bedclothes, sheets and blankets)?: A Little Difficulty moving from lying on back to sitting on the side of the bed? : A Little Difficulty sitting down on and standing up from a chair with arms (e.g., wheelchair, bedside commode, etc,.)?: A Little Help needed moving to and from a bed to chair (including a wheelchair)?: None Help needed walking in hospital room?: A Little Help needed climbing 3-5 steps with a railing? : A Little 6 Click Score: 19    End of Session   Activity Tolerance: Other (comment)(Limited by choice to not particpate) Patient left: in bed;with call bell/phone within reach   PT Visit Diagnosis: Unsteadiness on feet (R26.81)    Time: 1005-1013 PT Time Calculation (min) (ACUTE ONLY): 8 min   Charges:   PT Evaluation $PT Eval Low Complexity: 1 Low     PT G Codes:        Roney Marion, PT  Acute Rehabilitation Services Pager [phone] Office [phone]   Colletta [place] 06/19/2017, 1:32 PM

## 2017-06-19 NOTE — Care Management Note (Signed)
Case Management Note Marvetta Gibbons RN, BSN Unit 4E-Case Manager-- weekend coverage for [facility] [phone]  Patient Details  Name: [name] MRN: [id number] Date of Birth: 14-Jul-1960  Subjective/Objective:   Pt admitted with bleeding from left arm surgical site and also afib, COPD                 Action/Plan: PTA pt lived at home- just discharged from [facility] yesterday with [facility] services arranged with [facility]- (verified with [name] at [facility])- pt also had home wound VAC supplied by [facility]-(home wound VAC is in a bag in pt's room)- if pt is to return home with wound VAC- will need HH resumed and home wound VAC sent home for [facility] to reapply. - CM will follow for transition needs to return back home.   Expected Discharge Date:                  Expected Discharge Plan:  [place]  In-House Referral:     Discharge planning Services  CM Consult  Post Acute Care Choice:  Home Health, Durable Medical Equipment Choice offered to:  Patient  DME Arranged:  Vac DME Agency:  [place] Arranged:  RN [facility] Agency:  [facility]  Status of Service:  In process, will continue to follow  If discussed at Long Length of Stay Meetings, dates discussed:    Discharge Disposition:   Additional Comments:  [name], RN 06/19/2017, 1:18 PM

## 2017-06-20 LAB — HEMOGLOBIN AND HEMATOCRIT, BLOOD
HCT: 21.5 % — ABNORMAL LOW (ref 39.0–52.0)
Hemoglobin: 7 g/dL — ABNORMAL LOW (ref 13.0–17.0)

## 2017-06-20 LAB — CBC
HCT: 19.2 % — ABNORMAL LOW (ref 39.0–52.0)
Hemoglobin: 6.3 g/dL — CL (ref 13.0–17.0)
MCH: 27 pg (ref 26.0–34.0)
MCHC: 32.8 g/dL (ref 30.0–36.0)
MCV: 82.4 fL (ref 78.0–100.0)
Platelets: 234 10*3/uL (ref 150–400)
RBC: 2.33 MIL/uL — ABNORMAL LOW (ref 4.22–5.81)
RDW: 20.6 % — ABNORMAL HIGH (ref 11.5–15.5)
WBC: 13.2 10*3/uL — ABNORMAL HIGH (ref 4.0–10.5)

## 2017-06-20 LAB — RENAL FUNCTION PANEL
Albumin: 2.6 g/dL — ABNORMAL LOW (ref 3.5–5.0)
Anion gap: 10 (ref 5–15)
BUN: 49 mg/dL — ABNORMAL HIGH (ref 6–20)
CO2: 30 mmol/L (ref 22–32)
Calcium: 8.7 mg/dL — ABNORMAL LOW (ref 8.9–10.3)
Chloride: 90 mmol/L — ABNORMAL LOW (ref 101–111)
Creatinine, Ser: 8.63 mg/dL — ABNORMAL HIGH (ref 0.61–1.24)
GFR calc Af Amer: 7 mL/min — ABNORMAL LOW (ref >60)
GFR calc non Af Amer: 6 mL/min — ABNORMAL LOW (ref >60)
Glucose, Bld: 147 mg/dL — ABNORMAL HIGH (ref 65–99)
Phosphorus: 3.1 mg/dL (ref 2.5–4.6)
Potassium: 5.2 mmol/L — ABNORMAL HIGH (ref 3.5–5.1)
Sodium: 130 mmol/L — ABNORMAL LOW (ref 135–145)

## 2017-06-20 MED ORDER — SODIUM CHLORIDE 0.9 % IV SOLN: Freq: Once | INTRAVENOUS | Status: DC

## 2017-06-20 MED ORDER — LIDOCAINE-PRILOCAINE 2.5-2.5 % EX CREA
1.0000 "application " | TOPICAL_CREAM | CUTANEOUS | Status: DC | PRN
  Filled 2017-06-20: qty 5

## 2017-06-20 MED ORDER — PENTAFLUOROPROP-TETRAFLUOROETH EX AERO
1.0000 "application " | INHALATION_SPRAY | CUTANEOUS | Status: DC | PRN
  Filled 2017-06-20: qty 30

## 2017-06-20 MED ORDER — DIPHENHYDRAMINE HCL 25 MG PO CAPS
ORAL_CAPSULE | ORAL | Status: AC
  Administered 2017-06-20: 25 mg via ORAL
  Filled 2017-06-20: qty 1

## 2017-06-20 MED ORDER — MINOXIDIL 2.5 MG PO TABS
2.5000 mg | ORAL_TABLET | Freq: Two times a day (BID) | ORAL | Status: DC
  Administered 2017-06-21 (×2): 2.5 mg via ORAL
  Filled 2017-06-20 (×3): qty 1

## 2017-06-20 MED ORDER — HEPARIN SODIUM (PORCINE) 1000 UNIT/ML DIALYSIS
1000.0000 [IU] | INTRAMUSCULAR | Status: DC | PRN
  Administered 2017-06-20: 1000 [IU] via INTRAVENOUS_CENTRAL

## 2017-06-20 MED ORDER — AMLODIPINE BESYLATE 2.5 MG PO TABS: 2.5000 mg | ORAL_TABLET | Freq: Every day | ORAL | Status: DC

## 2017-06-20 MED ORDER — SODIUM CHLORIDE 0.9 % IV SOLN: 100.0000 mL | INTRAVENOUS | Status: DC | PRN

## 2017-06-20 MED ORDER — SODIUM POLYSTYRENE SULFONATE 15 GM/60ML PO SUSP: 15.0000 g | Freq: Once | ORAL | Status: DC

## 2017-06-20 MED ORDER — DIPHENHYDRAMINE HCL 25 MG PO CAPS
25.0000 mg | ORAL_CAPSULE | Freq: Once | ORAL | Status: AC
  Administered 2017-06-20: 25 mg via ORAL

## 2017-06-20 MED ORDER — CALCITRIOL 0.5 MCG PO CAPS
ORAL_CAPSULE | ORAL | Status: AC
  Administered 2017-06-20: 21:00:00
  Filled 2017-06-20: qty 1

## 2017-06-20 MED ORDER — LIDOCAINE HCL (PF) 1 % IJ SOLN: 5.0000 mL | INTRAMUSCULAR | Status: DC | PRN

## 2017-06-20 MED ORDER — DARBEPOETIN ALFA 200 MCG/0.4ML IJ SOSY
PREFILLED_SYRINGE | INTRAMUSCULAR | Status: AC
  Administered 2017-06-20: 200 ug via INTRAVENOUS
  Filled 2017-06-20: qty 0.4

## 2017-06-20 MED ORDER — AMLODIPINE BESYLATE 2.5 MG PO TABS
2.5000 mg | ORAL_TABLET | Freq: Two times a day (BID) | ORAL | Status: DC
  Administered 2017-06-21 (×2): 2.5 mg via ORAL
  Filled 2017-06-20 (×2): qty 1

## 2017-06-20 MED ORDER — CALCITRIOL 0.25 MCG PO CAPS
ORAL_CAPSULE | ORAL | Status: AC
  Administered 2017-06-20: 21:00:00
  Filled 2017-06-20: qty 1

## 2017-06-20 MED ORDER — ALTEPLASE 2 MG IJ SOLR
2.0000 mg | Freq: Once | INTRAMUSCULAR | Status: DC | PRN
Start: 2017-06-20 — End: 2017-06-21

## 2017-06-20 MED ORDER — OXYCODONE-ACETAMINOPHEN 5-325 MG PO TABS
1.0000 | ORAL_TABLET | Freq: Once | ORAL | Status: AC
  Administered 2017-06-20: 1 via ORAL
  Filled 2017-06-20: qty 1

## 2017-06-20 NOTE — Progress Notes (Addendum)
[facility] Progress Note  Subjective: no new c/o's today.  Denies any SOB or cough or orthopnea.   Vitals:   06/19/17 2118 06/20/17 0030 06/20/17 0601 06/20/17 0828  BP:  (!) 171/94  (!) 164/96  Pulse: 98 (!) 116    Resp: 18 17  12   Temp:  97.7 F (36.5 C)  (!) 97.3 F (36.3 C)  TempSrc:  Axillary  Axillary  SpO2: 99% 100%    Weight:   82.7 kg (182 lb 5.1 oz)   Height:        Inpatient medications: . calcitRIOL  0.75 mcg Oral Q M,W,F-HD  . darbepoetin (ARANESP) injection - DIALYSIS  200 mcg Intravenous Q Mon-HD  . diltiazem  30 mg Oral Q6H  . gabapentin  100 mg Oral BID  . isosorbide mononitrate  30 mg Oral Daily  . methylPREDNISolone (SOLU-MEDROL) injection  60 mg Intravenous Q12H  . multivitamin  1 tablet Oral QHS  . nicotine  14 mg Transdermal Daily  . sevelamer carbonate  3,200 mg Oral TID WC  . sodium chloride flush  3 mL Intravenous Q12H   . sodium chloride     acetaminophen **OR** acetaminophen, camphor-menthol, levalbuterol, minoxidil, zolpidem  Exam: Alert, no distress, has lost body wt L upper arm in large bandage No jvd Chest dec'd BS throughout, no rales, occ scattered wheezes Cor regular, no RG Abd soft , less distended now, nontender, no hsm Ext mild 1+ pretib edema bilat NF, ox 3  CXR 12/8 - no CHF, question LLL patchy infiltrate  Home meds: -norvasc 10/ imdur 20/ minoxidil 10 bid -renvela 4 ac/ MVI/ rocaltrol w hd -neurontin 100 bid/ ambien hs prn/ oxycodone 5 bid prn -plavix 75 qd -nicotine patch/ vit C -prns zofran/ tylenol/ albuterol/ senokot      Dialysis: [facility]  4h    2/2 bath  72.5kg (from early Nov)  Hep 3000   R IJ TDC -tsat 20%, Hb 9.4 early nov -mircera 200 ug last 11/14 -sensipar 120 w/ HD tiw       Impression: 1. AFib /RVR - per primary, on po dilt 30 qid 2. SOB - CXR w/o edema, up 10kg if wts accurate but not real vol overloaded on exam. Getting Rx for COPD w/ steroids, nebs 3. ESRD on HD MWF.  Hold  hep with HD for now.  4. MBD of CKD - need to reassess meds, hasn't been to center in a month 5. Anemia of CKD/ ABL - Hb 7 after 2u prbc, due to blood loss from L arm wound. Resuming ESA here Monday, also check fe stores.  6. COPD 7. HTN - on po dilt for afib, BP's still up will resume home norvasc / minox at low dose and titrate up 8. CAD hx RCA stent Aug 2018 - on plavix  Plan - HD today, max UF as Ronnie Derby MD [place] pager [phone]   06/20/2017, 12:09 PM   Recent Labs  Lab 06/16/17 0431 06/18/17 0929 06/18/17 0934 06/19/17 0259  NA 134* 131* 134* 132*  K 4.4 4.3 4.4 5.2*  CL 95* 94* 93* 92*  CO2 29 28  --  29  GLUCOSE 115* 112* 109* 165*  BUN 19 15 16  25*  CREATININE 4.90* 5.68* 5.40* 6.94*  CALCIUM 8.7* 8.6*  --  8.9   Recent Labs  Lab 06/18/17 0929 06/19/17 0259  AST 24 24  ALT 11* 11*  ALKPHOS 113 110  BILITOT 0.7  1.1  PROT 5.4* 5.4*  ALBUMIN 2.3* 2.4*   Recent Labs  Lab 06/18/17 0929  06/19/17 0259 06/19/17 2030 06/20/17 1056  WBC 6.7  --  7.2 13.6*  --   NEUTROABS 4.7  --   --  11.3*  --   HGB 7.0*   < > 6.5* 6.8* 7.0*  HCT 21.0*   < > 19.5* 20.7* 21.5*  MCV 83.7  --  83.0 81.8  --   PLT 193  --  213 235  --    < > = values in this interval not displayed.   Iron/TIBC/Ferritin/ %Sat    Component Value Date/Time   IRON 116 06/25/2011 0837   TIBC 277 06/25/2011 0837   FERRITIN 550 (H) 06/25/2011 0837   IRONPCTSAT 42 06/25/2011 1962

## 2017-06-20 NOTE — Progress Notes (Addendum)
PROGRESS NOTE    Laramie Meissner  OVF:[id number] DOB: 1961-06-24 DOA: 06/18/2017 PCP: [name], MD   Brief Narrative:  56 y.o. male who presents with atrial fibrillation with rapid ventricular response mild COPD exacerbation with hypoxemia.  Pt also had some bleeding from left arm which vascular surgery was consulted for.  Assessment & Plan:   Principal Problem:   Atrial flutter with rapid ventricular response ([place]) - Will transition to oral cardizem today. Orders placed - resolved with cardizem gtt  Anemia - Pt on room air with no accesory muscle use -Will plan on transfusing 1 unit of PRBC  Hyperkalemia - will add keyexalate today (addendum: d'c'd kayexalate as patient going to dialysis)  Active Problems:   COPD exacerbation ([place]) - Pt on solumedrol, no wheezes on exam. Will decrease solumedrol dose today.    Acute respiratory failure with hypoxia ([place]) -  Resolved patient on room air.    CKD (chronic kidney disease) stage V requiring chronic dialysis [facility]) - Nephrology consulted for management of dialysis    History of Cocaine abuse ([place])   Hypertension - Pt on cardizem, imdur    Bleeding for LUE - Vascular surgery consulted and on board. Had some more bleeding today. I requested nursing to inform vascular surgery for further evaluation.    Anxiety   - stable currently  DVT prophylaxis: early ambulation Code Status: Full Family Communication: none at bedside. Disposition Plan: pending improvement in condition   Consultants:   psychiatry   Procedures: none   Antimicrobials: none   Subjective: Pt himself has no new complaints. Nursing reports increased bleeding from arm.  Objective: Vitals:   06/20/17 0030 06/20/17 0601 06/20/17 0828 06/20/17 1339  BP: (!) 171/94  (!) 164/96   Pulse: (!) 116     Resp: 17  12   Temp: 97.7 F (36.5 C)  (!) 97.3 F (36.3 C) (!) 96.6 F (35.9 C)  TempSrc: Axillary  Axillary Axillary  SpO2: 100%      Weight:  82.7 kg (182 lb 5.1 oz)    Height:        Intake/Output Summary (Last 24 hours) at 06/20/2017 1435 Last data filed at 06/20/2017 1000 Gross per 24 hour  Intake 1200 ml  Output 150 ml  Net 1050 ml   Filed Weights   06/18/17 1717 06/19/17 0548 06/20/17 0601  Weight: 76.8 kg (169 lb 5 oz) 78.4 kg (172 lb 13.5 oz) 82.7 kg (182 lb 5.1 oz)    Examination:  General exam: Appears calm and comfortable, in nad. Respiratory system:  Respiratory effort normal. No wheezes, equal chest rise. Cardiovascular system: no cyanosis, RRR. No JVD Gastrointestinal system: Abdomen is nondistended, soft and nontender. No organomegaly or masses felt. Normal bowel sounds heard. Central nervous system: Alert and oriented. No focal neurological deficits. Extremities: non cyanotic with LUE in ACE wrapp Skin: No rashes, lesions or ulcers, on limited exam. Psychiatry: Mood & affect appropriate.     Data Reviewed: I have personally reviewed following labs and imaging studies  CBC: Recent Labs  Lab 06/14/17 0936  06/17/17 0441 06/18/17 0929 06/18/17 0934 06/19/17 0259 06/19/17 2030 06/20/17 1056  WBC 8.9  --  5.3 6.7  --  7.2 13.6*  --   NEUTROABS  --   --   --  4.7  --   --  11.3*  --   HGB 8.8*   < > 7.9* 7.0* 7.5* 6.5* 6.8* 7.0*  HCT 26.8*  --  23.8* 21.0*  22.0* 19.5* 20.7* 21.5*  MCV 86.2  --  84.9 83.7  --  83.0 81.8  --   PLT 184  --  213 193  --  213 235  --    < > = values in this interval not displayed.   Basic Metabolic Panel: Recent Labs  Lab 06/14/17 0936 06/16/17 0431 06/18/17 0925 06/18/17 0929 06/18/17 0934 06/19/17 0259  NA 130* 134*  --  131* 134* 132*  K 5.3* 4.4  --  4.3 4.4 5.2*  CL 92* 95*  --  94* 93* 92*  CO2 26 29  --  28  --  29  GLUCOSE 91 115*  --  112* 109* 165*  BUN 28* 19  --  15 16 25*  CREATININE 5.90* 4.90*  --  5.68* 5.40* 6.94*  CALCIUM 9.0 8.7*  --  8.6*  --  8.9  MG  --   --  1.6*  --   --   --    GFR: Estimated Creatinine  Clearance: 12.2 mL/min (A) (by C-G formula based on SCr of 6.94 mg/dL (H)). Liver Function Tests: Recent Labs  Lab 06/18/17 0929 06/19/17 0259  AST 24 24  ALT 11* 11*  ALKPHOS 113 110  BILITOT 0.7 1.1  PROT 5.4* 5.4*  ALBUMIN 2.3* 2.4*   No results for input(s): LIPASE, AMYLASE in the last 168 hours. No results for input(s): AMMONIA in the last 168 hours. Coagulation Profile: Recent Labs  Lab 06/14/17 0936  INR 1.20   Cardiac Enzymes: No results for input(s): CKTOTAL, CKMB, CKMBINDEX, TROPONINI in the last 168 hours. BNP (last 3 results) No results for input(s): PROBNP in the last 8760 hours. HbA1C: No results for input(s): HGBA1C in the last 72 hours. CBG: No results for input(s): GLUCAP in the last 168 hours. Lipid Profile: No results for input(s): CHOL, HDL, LDLCALC, TRIG, CHOLHDL, LDLDIRECT in the last 72 hours. Thyroid Function Tests: No results for input(s): TSH, T4TOTAL, FREET4, T3FREE, THYROIDAB in the last 72 hours. Anemia Panel: No results for input(s): VITAMINB12, FOLATE, FERRITIN, TIBC, IRON, RETICCTPCT in the last 72 hours. Sepsis Labs: Recent Labs  Lab 06/18/17 0925  PROCALCITON 0.67    Recent Results (from the past 240 hour(s))  Aerobic/Anaerobic Culture (surgical/deep wound)     Status: None (Preliminary result)   Collection Time: 06/14/17  5:19 PM  Result Value Ref Range Status   Specimen Description ARM LEFT  Final   Special Requests NONE  Final   Gram Stain   Final    FEW WBC PRESENT, PREDOMINANTLY PMN NO ORGANISMS SEEN    Culture   Final    NORMAL SKIN FLORA HOLDING FOR POSSIBLE ANAEROBE Performed at [facility], [address] [place], [place] [address]    Report Status PENDING  Incomplete  Respiratory Panel by PCR     Status: None   Collection Time: 06/18/17  6:28 PM  Result Value Ref Range Status   Adenovirus NOT DETECTED NOT DETECTED Final   Coronavirus 229E NOT DETECTED NOT DETECTED Final   Coronavirus HKU1 NOT DETECTED  NOT DETECTED Final   Coronavirus NL63 NOT DETECTED NOT DETECTED Final   Coronavirus OC43 NOT DETECTED NOT DETECTED Final   Metapneumovirus NOT DETECTED NOT DETECTED Final   Rhinovirus / Enterovirus NOT DETECTED NOT DETECTED Final   Influenza A NOT DETECTED NOT DETECTED Final   Influenza B NOT DETECTED NOT DETECTED Final   Parainfluenza Virus 1 NOT DETECTED NOT DETECTED Final  Parainfluenza Virus 2 NOT DETECTED NOT DETECTED Final   Parainfluenza Virus 3 NOT DETECTED NOT DETECTED Final   Parainfluenza Virus 4 NOT DETECTED NOT DETECTED Final   Respiratory Syncytial Virus NOT DETECTED NOT DETECTED Final   Bordetella pertussis NOT DETECTED NOT DETECTED Final   Chlamydophila pneumoniae NOT DETECTED NOT DETECTED Final   Mycoplasma pneumoniae NOT DETECTED NOT DETECTED Final  MRSA PCR Screening     Status: None   Collection Time: 06/18/17  6:28 PM  Result Value Ref Range Status   MRSA by PCR NEGATIVE NEGATIVE Final    Comment:        The GeneXpert MRSA Assay (FDA approved for NASAL specimens only), is one component of a comprehensive MRSA colonization surveillance program. It is not intended to diagnose MRSA infection nor to guide or monitor treatment for MRSA infections.      Radiology Studies: No results found.  Scheduled Meds: . amLODipine  2.5 mg Oral BID  . calcitRIOL  0.75 mcg Oral Q M,W,F-HD  . darbepoetin (ARANESP) injection - DIALYSIS  200 mcg Intravenous Q Mon-HD  . diltiazem  30 mg Oral Q6H  . gabapentin  100 mg Oral BID  . isosorbide mononitrate  30 mg Oral Daily  . methylPREDNISolone (SOLU-MEDROL) injection  60 mg Intravenous Q12H  . minoxidil  2.5 mg Oral BID  . multivitamin  1 tablet Oral QHS  . nicotine  14 mg Transdermal Daily  . sevelamer carbonate  3,200 mg Oral TID WC  . sodium chloride flush  3 mL Intravenous Q12H   Continuous Infusions: . sodium chloride       LOS: 2 days    Time spent: > 35 minutes  Velvet Bathe, MD [facility] Pager  [phone]  If 7PM-7AM, please contact night-coverage [email or web address] Password [email or web address] 06/20/2017, 2:35 PM

## 2017-06-20 NOTE — Progress Notes (Addendum)
  Progress Note    06/20/2017 11:39 AM * No surgery found *  Subjective:  Sleeping-wakes easily  afebrile  Vitals:   06/20/17 0030 06/20/17 0828  BP: (!) 171/94 (!) 164/96  Pulse: (!) 116   Resp: 17 12  Temp: 97.7 F (36.5 C) (!) 97.3 F (36.3 C)  SpO2: 100%     Physical Exam: Extremities:  LUA wound bed is clean; one small area of superficial bleeding   CBC    Component Value Date/Time   WBC 13.6 (H) 06/19/2017 2030   RBC 2.53 (L) 06/19/2017 2030   HGB 7.0 (L) 06/20/2017 1056   HCT 21.5 (L) 06/20/2017 1056   PLT 235 06/19/2017 2030   MCV 81.8 06/19/2017 2030   MCH 26.9 06/19/2017 2030   MCHC 32.9 06/19/2017 2030   RDW 20.0 (H) 06/19/2017 2030   LYMPHSABS 1.5 06/19/2017 2030   MONOABS 0.8 06/19/2017 2030   EOSABS 0.0 06/19/2017 2030   BASOSABS 0.0 06/19/2017 2030    BMET    Component Value Date/Time   NA 132 (L) 06/19/2017 0259   K 5.2 (H) 06/19/2017 0259   CL 92 (L) 06/19/2017 0259   CO2 29 06/19/2017 0259   GLUCOSE 165 (H) 06/19/2017 0259   BUN 25 (H) 06/19/2017 0259   CREATININE 6.94 (H) 06/19/2017 0259   CREATININE 9.18 (H) 02/15/2017 1425   CALCIUM 8.9 06/19/2017 0259   CALCIUM 8.1 (L) 07/11/2011 1117   GFRNONAA 8 (L) 06/19/2017 0259   GFRNONAA 6 (L) 02/15/2017 1425   GFRAA 9 (L) 06/19/2017 0259   GFRAA 7 (L) 02/15/2017 1425    INR    Component Value Date/Time   INR 1.20 06/14/2017 0936     Intake/Output Summary (Last 24 hours) at 06/20/2017 1139 Last data filed at 06/20/2017 1000 Gross per 24 hour  Intake 1580 ml  Output 150 ml  Net 1430 ml     Assessment:  56 y.o. male readmitted with bleeding LUA wound bed   Plan: -dressing removed and was saturated with blood.  Pressure held and hydrogel applied with wet to dry dressing.  Instructed RN if he bleeds again, to take dressing down and hold pressure on one area for 10 minutes and then reapply dressing.     Leontine Locket, PA-C [facility] [phone] 06/20/2017 11:39 AM  Agree with above.  There is one area less than 1 mm diameter in the wound bed that is prone to oozing I discussed with the patient's nurse that this area can be easily controlled with pressure. As wound heals this area should be less prone to bleeding  Continue daily hydrogel dressings  Ruta Hinds, MD [facility] [place]: [phone] Pager: [phone]

## 2017-06-20 NOTE — Progress Notes (Signed)
Lt. Upper arm dressing was oozing blood around 9 am. Changed dressing as ordered with hydrogel, 4x4 guaze then ace wrapped. Bleed litterely from old fistular area when tried to take out blood saturated guaze. Left it that and applied dressing. Notified vascular &vein team regarding this bleeding. Dr. Oneida Alar came to assess wounds and applied dressing. He wanted nurse to hold cut area for 5 min for stop bleeding. Reinforced patient not to pull out the dressing or pick it. Continue to educate patient. HS [employer]

## 2017-06-20 NOTE — Progress Notes (Signed)
HD tx initiated via HD cath w/o problem, pull/push/flush equally w/o problem, VSS, will cont to monitor while on HD tx 

## 2017-06-21 ENCOUNTER — Other Ambulatory Visit: Payer: Self-pay

## 2017-06-21 LAB — BPAM RBC
Blood Product Expiration Date: 201812132359
Blood Product Expiration Date: 201812192359
ISSUE DATE / TIME: 201812091252
ISSUE DATE / TIME: 201812102113
Unit Type and Rh: 9500
Unit Type and Rh: 9500

## 2017-06-21 LAB — AEROBIC/ANAEROBIC CULTURE W GRAM STAIN (SURGICAL/DEEP WOUND): Culture: NORMAL

## 2017-06-21 LAB — TYPE AND SCREEN
ABO/RH(D): O NEG
Antibody Screen: NEGATIVE
Unit division: 0
Unit division: 0

## 2017-06-21 LAB — IRON AND TIBC
Iron: 115 ug/dL (ref 45–182)
Saturation Ratios: 36 % (ref 17.9–39.5)
TIBC: 318 ug/dL (ref 250–450)
UIBC: 203 ug/dL

## 2017-06-21 LAB — PREPARE RBC (CROSSMATCH)

## 2017-06-21 LAB — CBC
HCT: 25.4 % — ABNORMAL LOW (ref 39.0–52.0)
Hemoglobin: 8.3 g/dL — ABNORMAL LOW (ref 13.0–17.0)
MCH: 27.2 pg (ref 26.0–34.0)
MCHC: 32.7 g/dL (ref 30.0–36.0)
MCV: 83.3 fL (ref 78.0–100.0)
Platelets: 280 10*3/uL (ref 150–400)
RBC: 3.05 MIL/uL — ABNORMAL LOW (ref 4.22–5.81)
RDW: 19.4 % — ABNORMAL HIGH (ref 11.5–15.5)
WBC: 13 10*3/uL — ABNORMAL HIGH (ref 4.0–10.5)

## 2017-06-21 MED ORDER — ALTEPLASE 2 MG IJ SOLR: 2.0000 mg | Freq: Once | INTRAMUSCULAR | Status: DC | PRN

## 2017-06-21 MED ORDER — MINOXIDIL 2.5 MG PO TABS
5.0000 mg | ORAL_TABLET | Freq: Two times a day (BID) | ORAL | Status: DC
  Administered 2017-06-21 – 2017-06-22 (×2): 5 mg via ORAL
  Filled 2017-06-21 (×2): qty 2

## 2017-06-21 MED ORDER — SODIUM CHLORIDE 0.9 % IV SOLN: 100.0000 mL | INTRAVENOUS | Status: DC | PRN

## 2017-06-21 MED ORDER — LIDOCAINE-PRILOCAINE 2.5-2.5 % EX CREA: 1.0000 "application " | TOPICAL_CREAM | CUTANEOUS | Status: DC | PRN

## 2017-06-21 MED ORDER — HEPARIN SODIUM (PORCINE) 1000 UNIT/ML DIALYSIS: 1000.0000 [IU] | INTRAMUSCULAR | Status: DC | PRN

## 2017-06-21 MED ORDER — DEXTROSE 50 % IV SOLN
INTRAVENOUS | Status: DC
Start: 2017-06-21 — End: 2017-06-21
  Filled 2017-06-21: qty 50

## 2017-06-21 MED ORDER — AMLODIPINE BESYLATE 5 MG PO TABS
5.0000 mg | ORAL_TABLET | Freq: Two times a day (BID) | ORAL | Status: DC
  Administered 2017-06-21 – 2017-06-23 (×3): 5 mg via ORAL
  Filled 2017-06-21 (×4): qty 1

## 2017-06-21 MED ORDER — LIDOCAINE HCL (PF) 1 % IJ SOLN: 5.0000 mL | INTRAMUSCULAR | Status: DC | PRN

## 2017-06-21 MED ORDER — PENTAFLUOROPROP-TETRAFLUOROETH EX AERO: 1.0000 "application " | INHALATION_SPRAY | CUTANEOUS | Status: DC | PRN

## 2017-06-21 NOTE — Progress Notes (Signed)
[facility] [place]  Subjective  - feels ok   Objective (!) 185/84 98 (!) 97.1 F (36.2 C) (Oral) 15 100%  Intake/Output Summary (Last 24 hours) at 06/21/2017 1318 Last data filed at 06/21/2017 1303 Gross per 24 hour  Intake 2033 ml  Output 4008 ml  Net -1975 ml    Assessment/Planning: No further bleeding dressing just changed Continue current wound care. Can go home from our standpoint. Will need follow up in 2 weeks Cont to follow while in house  Ruta Hinds 06/21/2017 1:18 PM --  Laboratory Lab Results: Recent Labs    06/19/17 2030 06/20/17 1056 06/20/17 2005  WBC 13.6*  --  13.2*  HGB 6.8* 7.0* 6.3*  HCT 20.7* 21.5* 19.2*  PLT 235  --  234   BMET Recent Labs    06/19/17 0259 06/20/17 2005  NA 132* 130*  K 5.2* 5.2*  CL 92* 90*  CO2 29 30  GLUCOSE 165* 147*  BUN 25* 49*  CREATININE 6.94* 8.63*  CALCIUM 8.9 8.7*    COAG Lab Results  Component Value Date   INR 1.20 06/14/2017   INR 1.20 06/04/2017   INR 1.17 06/03/2017   No results found for: PTT

## 2017-06-21 NOTE — Progress Notes (Signed)
HD tx completed w/o problem, UF goal met, blood rinsed back, pt received 1 unit PRBC while on HD tx w/ no s/s of reaction, report called to Mikey Bussing, RN

## 2017-06-21 NOTE — Progress Notes (Signed)
[facility] Progress Note  Subjective: no new c/o's today.  Had 4 L off with HDyest, no BP drops, BP's still high.  No SOB.  Says they fixed some bleeding area on his L upper arm yest and it is doing a lot better now.   Vitals:   06/21/17 0100 06/21/17 0348 06/21/17 0500 06/21/17 0912  BP: (!) 174/101 (!) 182/85  (!) 174/79  Pulse: 62 90  82  Resp: 19 13  17   Temp: 97.7 F (36.5 C) (!) 97.5 F (36.4 C)  98.2 F (36.8 C)  TempSrc: Oral Axillary  Oral  SpO2: (!) 81%   99%  Weight:   78 kg (171 lb 15.3 oz)   Height:        Inpatient medications: . amLODipine  2.5 mg Oral BID  . calcitRIOL  0.75 mcg Oral Q M,W,F-HD  . darbepoetin (ARANESP) injection - DIALYSIS  200 mcg Intravenous Q Mon-HD  . diltiazem  30 mg Oral Q6H  . gabapentin  100 mg Oral BID  . isosorbide mononitrate  30 mg Oral Daily  . methylPREDNISolone (SOLU-MEDROL) injection  60 mg Intravenous Q12H  . minoxidil  2.5 mg Oral BID  . multivitamin  1 tablet Oral QHS  . nicotine  14 mg Transdermal Daily  . sevelamer carbonate  3,200 mg Oral TID WC  . sodium chloride flush  3 mL Intravenous Q12H   . sodium chloride    . sodium chloride    . sodium chloride    . sodium chloride     sodium chloride, sodium chloride, acetaminophen **OR** acetaminophen, alteplase, camphor-menthol, heparin, levalbuterol, lidocaine (PF), lidocaine-prilocaine, minoxidil, pentafluoroprop-tetrafluoroeth, zolpidem  Exam: Alert, no distress L upper arm in large bandage No jvd Chest dec'd BS throughout, no rales/ wheezes Cor regular, no RG Abd soft, ntnd, no ascites Ext trace - 1+ edema NF, ox 3  CXR 12/8 - no CHF, question LLL patchy infiltrate  Home meds: -norvasc 10/ imdur 20/ minoxidil 10 bid -renvela 4 ac/ MVI/ rocaltrol w hd -neurontin 100 bid/ ambien hs prn/ oxycodone 5 bid prn -plavix 75 qd -nicotine patch/ vit C -prns zofran/ tylenol/ albuterol/ senokot   Dialysis: [facility]  4h    2/2 bath  72.5kg (from  early Nov)  Hep 3000   R IJ TDC -tsat 20%, Hb 9.4 early nov -mircera 200 ug last 11/14 -sensipar 120 w/ HD tiw       Impression: 1. AFib /RVR - per primary, started po dilt 30 qid 2. HTN - po dilt added for afib, increase norvasc/ minox today as BP's still up 3. Bleeding wound LUA - per VVS, doing better today. Question of infection, on empiric IV abx vanc/ zosyn.  4. SOB - on Rx for COPD w/ steroids, nebs. Last CXR 12/8 no edema 5. Volume - wt's down, 4L off yest on HD. Cont to lower vol towards edw 72kg 6. ESRD on HD MWF.  HD via [facility], failed LUA AVF last admit. Hold hep with HD for now w bleeding L arm 7. MBD of CKD - need to reassess meds, hasn't been to center in a month 8. Anemia of CKD/ ABL - Hb 7 after 2u prbc, due to blood loss from L arm wound. Resumed ESA here w darbe 200 ug q Monday, last dose 12/10 here. Fe tsat is ok at 36% here.  9. COPD 10. CAD hx RCA stent Aug 2018 - on plavix  Plan - HD Wed, UF 4-5 L as  Ronnie Derby MD [facility] pager [phone]   06/21/2017, 11:41 AM   Recent Labs  Lab 06/18/17 0929 06/18/17 0934 06/19/17 0259 06/20/17 2005  NA 131* 134* 132* 130*  K 4.3 4.4 5.2* 5.2*  CL 94* 93* 92* 90*  CO2 28  --  29 30  GLUCOSE 112* 109* 165* 147*  BUN 15 16 25* 49*  CREATININE 5.68* 5.40* 6.94* 8.63*  CALCIUM 8.6*  --  8.9 8.7*  PHOS  --   --   --  3.1   Recent Labs  Lab 06/18/17 0929 06/19/17 0259 06/20/17 2005  AST 24 24  --   ALT 11* 11*  --   ALKPHOS 113 110  --   BILITOT 0.7 1.1  --   PROT 5.4* 5.4*  --   ALBUMIN 2.3* 2.4* 2.6*   Recent Labs  Lab 06/18/17 0929  06/19/17 0259 06/19/17 2030 06/20/17 1056 06/20/17 2005  WBC 6.7  --  7.2 13.6*  --  13.2*  NEUTROABS 4.7  --   --  11.3*  --   --   HGB 7.0*   < > 6.5* 6.8* 7.0* 6.3*  HCT 21.0*   < > 19.5* 20.7* 21.5* 19.2*  MCV 83.7  --  83.0 81.8  --  82.4  PLT 193  --  213 235  --  234   < > = values in this interval not displayed.    Iron/TIBC/Ferritin/ %Sat    Component Value Date/Time   IRON 115 06/20/2017 2335   TIBC 318 06/20/2017 2335   FERRITIN 550 (H) 06/25/2011 0837   IRONPCTSAT 36 06/20/2017 2335

## 2017-06-21 NOTE — Progress Notes (Signed)
Occupational Therapy Treatment Patient Details Name: [name] MRN: [id number] DOB: October 01, 1960 Today's Date: 06/21/2017    History of present illness [name] is a 56 y.o. male who presents with atrial fibrillation with rapid ventricular response mild COPD exacerbation with hypoxemia; Prior to admission, he was at [facility] for anxiety, when he developed He developed symptoms consistent with an infected left forearm AV graft dialysis access that required I&D and placement of wound VAC   OT comments  Pt demonstrating progress toward OT goals. He was able to complete standing grooming tasks as well as toileting hygiene with supervision for safety. Pt continues to require min guard assist for functional mobility during toilet transfers and simulated IADL due to instability and B knee buckling at times. Pt with edema in B LE with minimal time in dependent position and encouraged pt to elevate B LE at rest. Pt additionally with small skin tear on L index finger after toileting tasks and bleeding stopped with applied pressure and gauze application. OT will continue to follow while admitted and D/C recommendation remains appropriate.    Follow Up Recommendations  Home health OT;Supervision - Intermittent    Equipment Recommendations  3 in 1 bedside commode    Recommendations for Other Services PT consult    Precautions / Restrictions Precautions Precautions: Fall Restrictions Weight Bearing Restrictions: No       Mobility Bed Mobility Overal bed mobility: Modified Independent                Transfers Overall transfer level: Needs assistance Equipment used: None Transfers: Sit to/from Stand Sit to Stand: Supervision         General transfer comment: Supervision for safety.     Balance Overall balance assessment: Needs assistance Sitting-balance support: Feet supported;No upper extremity supported Sitting balance-Leahy Scale:  Good     Standing balance support: No upper extremity supported;During functional activity Standing balance-Leahy Scale: Fair                             ADL either performed or assessed with clinical judgement   ADL Overall ADL's : Needs assistance/impaired     Grooming: Supervision/safety;Standing           Upper Body Dressing : Set up;Sitting Upper Body Dressing Details (indicate cue type and reason): Although required cues to orient gown correctly     Toilet Transfer: Ambulation;Regular Toilet;Min guard   Toileting- Clothing Manipulation and Hygiene: Supervision/safety;Sit to/from stand       Functional mobility during ADLs: Min guard General ADL Comments: Edema in B feet with minimal dependent positioning.      Vision       Perception     Praxis      Cognition Arousal/Alertness: Awake/alert Behavior During Therapy: WFL for tasks assessed/performed Overall Cognitive Status: Within Functional Limits for tasks assessed                                 General Comments: Requires encouragement to participate        Exercises     Shoulder Instructions       General Comments Edema in B LE with minimal time in dependent position. Educated concerning elevation when not ambulating.     Pertinent Vitals/ Pain       Pain Assessment: 0-10 Pain Score: 10-Worst pain ever(8 1/2 lying in bed.)  Faces Pain Scale: No hurt Pain Location: L UE at wound; "always at a 10" Pain Descriptors / Indicators: Burning;Aching;Sharp;Shooting Pain Intervention(s): Monitored during session  Home Living                                          Prior Functioning/Environment              Frequency  Min 2X/week        Progress Toward Goals  OT Goals(current goals can now be found in the care plan section)  Progress towards OT goals: Progressing toward goals  Acute Rehab OT Goals Patient Stated Goal: take a nap OT Goal  Formulation: With patient Time For Goal Achievement: 07/03/17 Potential to Achieve Goals: Good  Plan Discharge plan remains appropriate    Co-evaluation                 AM-PAC PT "6 Clicks" Daily Activity     Outcome Measure   Help from another person eating meals?: None Help from another person taking care of personal grooming?: A Little Help from another person toileting, which includes using toliet, bedpan, or urinal?: A Little Help from another person bathing (including washing, rinsing, drying)?: A Little Help from another person to put on and taking off regular upper body clothing?: A Little Help from another person to put on and taking off regular lower body clothing?: A Little 6 Click Score: 19    End of Session Equipment Utilized During Treatment: Gait belt  OT Visit Diagnosis: Unsteadiness on feet (R26.81);Other abnormalities of gait and mobility (R26.89)   Activity Tolerance Patient tolerated treatment well   Patient Left in bed;with call bell/phone within reach   Nurse Communication Mobility status        Time: 0350-0938 OT Time Calculation (min): 27 min  Charges: OT General Charges $OT Visit: 1 Visit OT Treatments $Self Care/Home Management : 23-37 mins  Norman Herrlich, MS OTR/L  Pager: [place] 06/21/2017, 1:40 PM

## 2017-06-21 NOTE — Progress Notes (Signed)
PROGRESS NOTE    [name]  GBT:[id number] DOB: 16-Aug-1960 DOA: 06/18/2017 PCP: [name], MD   Brief Narrative:  56 y.o. male who presents with atrial fibrillation with rapid ventricular response mild COPD exacerbation with hypoxemia.  Pt also had some bleeding from left arm (AV access site) which vascular surgery was consulted for.  Assessment & Plan:   Principal Problem:   Atrial flutter with rapid ventricular response ([place]) - resolved with cardizem gtt transitioned to oral cardizem.  - RVR resolved.  Anemia - Pt on room air with no accesory muscle use - s/p transfusion. Will reassess cbc levels and reassess next am. Pt still having some oozing from arm but bleeding is resolving.  Hyperkalemia - was taken for dialysis. Will reassess bmp   Active Problems:   COPD exacerbation (HCC) - No wheezes on exam will d/c solumedrol. Continue bronchodilators.     Acute respiratory failure with hypoxia ([place]) -  Resolved patient on room air.    CKD (chronic kidney disease) stage V requiring chronic dialysis [facility]) - Nephrology consulted for management of dialysis    History of Cocaine abuse ([place])   Hypertension - Pt on cardizem, imdur    Bleeding for LUE - per nursing has stopped. Some oozing but has stopped. Will monitor blood levels.    Anxiety   - stable currently  DVT prophylaxis: early ambulation Code Status: Full Family Communication: none at bedside. Disposition Plan: Once blood levels stable   Consultants:   Psychiatry Nephrology Vascular surgery   Procedures: none   Antimicrobials: none   Subjective: No new complaints reported.  Objective: Vitals:   06/21/17 0348 06/21/17 0500 06/21/17 0912 06/21/17 1213  BP: (!) 182/85  (!) 174/79 (!) 185/84  Pulse: 90  82 98  Resp: 13  17 15   Temp: (!) 97.5 F (36.4 C)  98.2 F (36.8 C) (!) 97.1 F (36.2 C)  TempSrc: Axillary  Oral Oral  SpO2:   99% 100%  Weight:  78 kg (171 lb 15.3  oz)    Height:        Intake/Output Summary (Last 24 hours) at 06/21/2017 1629 Last data filed at 06/21/2017 1303 Gross per 24 hour  Intake 1733 ml  Output 4008 ml  Net -2275 ml   Filed Weights   06/20/17 1942 06/20/17 2347 06/21/17 0500  Weight: 83.6 kg (184 lb 4.9 oz) 79.6 kg (175 lb 7.8 oz) 78 kg (171 lb 15.3 oz)    Examination: Exam unchanged when compared to prioron 06/20/17  General exam: Appears calm and comfortable, in nad. Respiratory system:  Respiratory effort normal. No wheezes, equal chest rise. Cardiovascular system: no cyanosis, RRR. No JVD Gastrointestinal system: Abdomen is nondistended, soft and nontender. No organomegaly or masses felt. Normal bowel sounds heard. Central nervous system: Alert and oriented. No focal neurological deficits. Extremities: non cyanotic with LUE in ACE wrapp Skin: No rashes, lesions or ulcers, on limited exam. Psychiatry: Mood & affect appropriate.     Data Reviewed: I have personally reviewed following labs and imaging studies  CBC: Recent Labs  Lab 06/17/17 0441 06/18/17 0929 06/18/17 0934 06/19/17 0259 06/19/17 2030 06/20/17 1056 06/20/17 2005  WBC 5.3 6.7  --  7.2 13.6*  --  13.2*  NEUTROABS  --  4.7  --   --  11.3*  --   --   HGB 7.9* 7.0* 7.5* 6.5* 6.8* 7.0* 6.3*  HCT 23.8* 21.0* 22.0* 19.5* 20.7* 21.5* 19.2*  MCV 84.9 83.7  --  83.0 81.8  --  82.4  PLT 213 193  --  213 235  --  329   Basic Metabolic Panel: Recent Labs  Lab 06/16/17 0431 06/18/17 0925 06/18/17 0929 06/18/17 0934 06/19/17 0259 06/20/17 2005  NA 134*  --  131* 134* 132* 130*  K 4.4  --  4.3 4.4 5.2* 5.2*  CL 95*  --  94* 93* 92* 90*  CO2 29  --  28  --  29 30  GLUCOSE 115*  --  112* 109* 165* 147*  BUN 19  --  15 16 25* 49*  CREATININE 4.90*  --  5.68* 5.40* 6.94* 8.63*  CALCIUM 8.7*  --  8.6*  --  8.9 8.7*  MG  --  1.6*  --   --   --   --   PHOS  --   --   --   --   --  3.1   GFR: Estimated Creatinine Clearance: 9.8 mL/min (A) (by  C-G formula based on SCr of 8.63 mg/dL (H)). Liver Function Tests: Recent Labs  Lab 06/18/17 0929 06/19/17 0259 06/20/17 2005  AST 24 24  --   ALT 11* 11*  --   ALKPHOS 113 110  --   BILITOT 0.7 1.1  --   PROT 5.4* 5.4*  --   ALBUMIN 2.3* 2.4* 2.6*   No results for input(s): LIPASE, AMYLASE in the last 168 hours. No results for input(s): AMMONIA in the last 168 hours. Coagulation Profile: No results for input(s): INR, PROTIME in the last 168 hours. Cardiac Enzymes: No results for input(s): CKTOTAL, CKMB, CKMBINDEX, TROPONINI in the last 168 hours. BNP (last 3 results) No results for input(s): PROBNP in the last 8760 hours. HbA1C: No results for input(s): HGBA1C in the last 72 hours. CBG: No results for input(s): GLUCAP in the last 168 hours. Lipid Profile: No results for input(s): CHOL, HDL, LDLCALC, TRIG, CHOLHDL, LDLDIRECT in the last 72 hours. Thyroid Function Tests: No results for input(s): TSH, T4TOTAL, FREET4, T3FREE, THYROIDAB in the last 72 hours. Anemia Panel: Recent Labs    06/20/17 2335  TIBC 318  IRON 115   Sepsis Labs: Recent Labs  Lab 06/18/17 0925  PROCALCITON 0.67    Recent Results (from the past 240 hour(s))  Aerobic/Anaerobic Culture (surgical/deep wound)     Status: None   Collection Time: 06/14/17  5:19 PM  Result Value Ref Range Status   Specimen Description ARM LEFT  Final   Special Requests NONE  Final   Gram Stain   Final    FEW WBC PRESENT, PREDOMINANTLY PMN NO ORGANISMS SEEN    Culture   Final    RARE NORMAL SKIN FLORA NO ANAEROBES ISOLATED Performed at [facility], [place] [address], [place], [place] [address]    Report Status 06/21/2017 FINAL  Final  Respiratory Panel by PCR     Status: None   Collection Time: 06/18/17  6:28 PM  Result Value Ref Range Status   Adenovirus NOT DETECTED NOT DETECTED Final   Coronavirus 229E NOT DETECTED NOT DETECTED Final   Coronavirus HKU1 NOT DETECTED NOT DETECTED Final   Coronavirus  NL63 NOT DETECTED NOT DETECTED Final   Coronavirus OC43 NOT DETECTED NOT DETECTED Final   Metapneumovirus NOT DETECTED NOT DETECTED Final   Rhinovirus / Enterovirus NOT DETECTED NOT DETECTED Final   Influenza A NOT DETECTED NOT DETECTED Final   Influenza B NOT DETECTED NOT DETECTED Final   Parainfluenza Virus 1  NOT DETECTED NOT DETECTED Final   Parainfluenza Virus 2 NOT DETECTED NOT DETECTED Final   Parainfluenza Virus 3 NOT DETECTED NOT DETECTED Final   Parainfluenza Virus 4 NOT DETECTED NOT DETECTED Final   Respiratory Syncytial Virus NOT DETECTED NOT DETECTED Final   Bordetella pertussis NOT DETECTED NOT DETECTED Final   Chlamydophila pneumoniae NOT DETECTED NOT DETECTED Final   Mycoplasma pneumoniae NOT DETECTED NOT DETECTED Final  MRSA PCR Screening     Status: None   Collection Time: 06/18/17  6:28 PM  Result Value Ref Range Status   MRSA by PCR NEGATIVE NEGATIVE Final    Comment:        The GeneXpert MRSA Assay (FDA approved for NASAL specimens only), is one component of a comprehensive MRSA colonization surveillance program. It is not intended to diagnose MRSA infection nor to guide or monitor treatment for MRSA infections.      Radiology Studies: No results found.  Scheduled Meds: . amLODipine  5 mg Oral BID  . calcitRIOL  0.75 mcg Oral Q M,W,F-HD  . darbepoetin (ARANESP) injection - DIALYSIS  200 mcg Intravenous Q Mon-HD  . diltiazem  30 mg Oral Q6H  . gabapentin  100 mg Oral BID  . isosorbide mononitrate  30 mg Oral Daily  . minoxidil  5 mg Oral BID  . multivitamin  1 tablet Oral QHS  . nicotine  14 mg Transdermal Daily  . sevelamer carbonate  3,200 mg Oral TID WC  . sodium chloride flush  3 mL Intravenous Q12H   Continuous Infusions: . sodium chloride    . sodium chloride    . sodium chloride    . sodium chloride       LOS: 3 days    Time spent: > 35 minutes  Velvet Bathe, MD [facility] Pager [phone]  If 7PM-7AM, please contact  night-coverage [email or web address] Password [facility] 06/21/2017, 4:29 PM

## 2017-06-21 NOTE — Progress Notes (Signed)
Physical Therapy Treatment Patient Details Name: [name] MRN: [id number] DOB: [birth year] Today's Date: 06/21/2017    History of Present Illness Pt is a 56 y.o. male who presents with atrial fibrillation with rapid ventricular response mild COPD exacerbation with hypoxemia; PTA he was at [facility] for anxiety, when he developed He developed symptoms consistent with an infected left forearm AV graft dialysis access that required I&D and placement of wound VAC   PT Comments    Required max encouragement and education to ambulate with PT this session. Able to amb 200' with no AD and supervision for safety. Slight instability noted, but no overt LOB. Pt declining further amb and stair training secondary to c/o fatigue. BLE swelling noted upon return to room; encouraged pt to elevate BLEs instead of sitting up EOB, but pt refusing assist to do so. Continue to feel HHPT is appropriate. Will continue to follow acutely.   Follow Up Recommendations  Home health PT     Equipment Recommendations       Recommendations for Other Services       Precautions / Restrictions Precautions Precautions: Fall Restrictions Weight Bearing Restrictions: No    Mobility  Bed Mobility Overal bed mobility: Independent             General bed mobility comments: Received sitting in chair  Transfers Overall transfer level: Needs assistance Equipment used: None Transfers: Sit to/from Stand Sit to Stand: Supervision         General transfer comment: Supervision for safety  Ambulation/Gait Ambulation/Gait assistance: Supervision Ambulation Distance (Feet): 200 Feet Assistive device: None Gait Pattern/deviations: Step-through pattern;Drifts right/left Gait velocity: Decreased Gait velocity interpretation: <1.8 ft/sec, indicative of risk for recurrent falls General Gait Details: Max encouragment to amb with PT. Amb 200' with no overt LOB, but declining stair training  secondary to fatigue post-amb. Increased BLE noted upon return to bed; offered to assist pt elevated BLEs, but refusing stating he will do so on his own later   Stairs            Wheelchair Mobility    Modified Rankin (Stroke Patients Only)       Balance Overall balance assessment: Needs assistance Sitting-balance support: Feet supported;No upper extremity supported Sitting balance-Leahy Scale: Good     Standing balance support: No upper extremity supported;During functional activity Standing balance-Leahy Scale: Fair               High level balance activites: Side stepping;Backward walking;Direction changes;Turns;Sudden stops High Level Balance Comments: Slight instability, but no overt LOB with higher level balance activities. Supervision for safety            Cognition Arousal/Alertness: Awake/alert Behavior During Therapy: WFL for tasks assessed/performed Overall Cognitive Status: Within Functional Limits for tasks assessed                                 General Comments: Max encouragement to participate      Exercises      General Comments        Pertinent Vitals/Pain Pain Assessment: 0-10 Faces Pain Scale: Hurts a little bit Pain Location: LUE Pain Descriptors / Indicators: Sore;Discomfort Pain Intervention(s): Monitored during session    Home Living                      Prior Function  PT Goals (current goals can now be found in the care plan section) Acute Rehab PT Goals Patient Stated Goal: Return home PT Goal Formulation: With patient Time For Goal Achievement: 07/03/17 Potential to Achieve Goals: Good Progress towards PT goals: Progressing toward goals    Frequency    Min 3X/week      PT Plan Current plan remains appropriate    Co-evaluation              AM-PAC PT "6 Clicks" Daily Activity  Outcome Measure  Difficulty turning over in bed (including adjusting bedclothes,  sheets and blankets)?: None Difficulty moving from lying on back to sitting on the side of the bed? : None Difficulty sitting down on and standing up from a chair with arms (e.g., wheelchair, bedside commode, etc,.)?: None Help needed moving to and from a bed to chair (including a wheelchair)?: A Little Help needed walking in hospital room?: A Little Help needed climbing 3-5 steps with a railing? : A Little 6 Click Score: 21    End of Session Equipment Utilized During Treatment: Gait belt Activity Tolerance: Patient tolerated treatment well Patient left: in bed;with call bell/phone within reach Nurse Communication: Mobility status PT Visit Diagnosis: Unsteadiness on feet (R26.81)     Time: 6195-0932 PT Time Calculation (min) (ACUTE ONLY): 25 min  Charges:  $Gait Training: 23-37 mins                    G Codes:      Mabeline Caras, PT, DPT Acute Rehab Services  Pager: [place] 06/21/2017, 5:49 PM

## 2017-06-22 DIAGNOSIS — I4892 Unspecified atrial flutter: Secondary | ICD-10-CM

## 2017-06-22 LAB — CBC
HCT: 27.2 % — ABNORMAL LOW (ref 39.0–52.0)
Hemoglobin: 8.9 g/dL — ABNORMAL LOW (ref 13.0–17.0)
MCH: 27.7 pg (ref 26.0–34.0)
MCHC: 32.7 g/dL (ref 30.0–36.0)
MCV: 84.7 fL (ref 78.0–100.0)
Platelets: 266 10*3/uL (ref 150–400)
RBC: 3.21 MIL/uL — ABNORMAL LOW (ref 4.22–5.81)
RDW: 19.5 % — ABNORMAL HIGH (ref 11.5–15.5)
WBC: 14.6 10*3/uL — ABNORMAL HIGH (ref 4.0–10.5)

## 2017-06-22 LAB — BASIC METABOLIC PANEL
Anion gap: 10 (ref 5–15)
BUN: 33 mg/dL — ABNORMAL HIGH (ref 6–20)
CO2: 27 mmol/L (ref 22–32)
Calcium: 9.3 mg/dL (ref 8.9–10.3)
Chloride: 93 mmol/L — ABNORMAL LOW (ref 101–111)
Creatinine, Ser: 5.99 mg/dL — ABNORMAL HIGH (ref 0.61–1.24)
GFR calc Af Amer: 11 mL/min — ABNORMAL LOW (ref >60)
GFR calc non Af Amer: 10 mL/min — ABNORMAL LOW (ref >60)
Glucose, Bld: 205 mg/dL — ABNORMAL HIGH (ref 65–99)
Potassium: 4.6 mmol/L (ref 3.5–5.1)
Sodium: 130 mmol/L — ABNORMAL LOW (ref 135–145)

## 2017-06-22 MED ORDER — MINOXIDIL 2.5 MG PO TABS
2.5000 mg | ORAL_TABLET | Freq: Two times a day (BID) | ORAL | Status: DC
  Administered 2017-06-23: 2.5 mg via ORAL
  Filled 2017-06-22 (×2): qty 1

## 2017-06-22 NOTE — Care Management Note (Signed)
Case Management Note Original Note Created by: Marvetta Gibbons RN, BSN Unit 4E-Case Manager-- weekend coverage for [employer] [phone]  Patient Details  Name: [name] MRN: [id number] Date of Birth: March 14, 1961  Subjective/Objective:   Pt admitted with bleeding from left arm surgical site and also afib, COPD                 Action/Plan: PTA pt lived at home- just discharged from [facility] yesterday with [facility] services arranged with [facility]- (verified with [name] at [facility])- pt also had home wound VAC supplied by [facility]-(home wound VAC is in a bag in pt's room)- if pt is to return home with wound VAC- will need HH resumed and home wound VAC sent home for [facility] to reapply. - CM will follow for transition needs to return back home.   Expected Discharge Date:                  Expected Discharge Plan:  [place]  In-House Referral:     Discharge planning Services  CM Consult  Post Acute Care Choice:  Home Health, Durable Medical Equipment Choice offered to:  Patient  DME Arranged:  Vac DME Agency:  [place] Arranged:  RN [facility] Agency:  [facility]  Status of Service:  In process, will continue to follow  If discussed at Long Length of Stay Meetings, dates discussed:    Discharge Disposition:   Additional Comments: 06/21/17 Pt alert and oriented.  Per Attending pt will likely not discharge home with wound vac per surgery.  Pt does not currently have on wound vac however his wound vac from home is in his room.  CM informed [facility] that pt will not likely require HH for wound vac management.  Pt independently ambulating around unit.  Pt states he will have family to pick him up at discharge - pt denied all CM needs.  [facility] also made aware of pt likely discharging without wound vac Maryclare Labrador, RN 06/22/2017, 1:46 PM

## 2017-06-22 NOTE — Progress Notes (Signed)
PROGRESS NOTE    Patient: [name]     PCP: [name], MD                    DOB: 10/17/1960            DOA: 06/18/2017 JQB:[id number]             DOS: 06/22/2017, 1:18 PM   Date of Service: the patient was seen and examined on 06/22/2017 Subjective:  Patient was seen and examined this morning, stable in no acute distress No issues overnight  Patient was to be discharged yesterday was held back secondary to anemia low H&H. Stable this morning anticipating hemodialysis possible blood transfusion today.  ----------------------------------------------------------------------------------------------------------------------  Brief Narrative:   56 y.o.malewho presents with atrial fibrillation with rapid ventricular response mild COPD exacerbation with hypoxemia. Also had some bleeding from left arm (AV access site) which vascular surgery was consulted for.     Principal Problem:   Atrial flutter with rapid ventricular response (HCC) Active Problems:   COPD exacerbation (HCC)   Acute respiratory failure with hypoxia (HCC)   CKD (chronic kidney disease) stage V requiring chronic dialysis ([place])   History of Cocaine abuse ([place])   Hypertension   Infection of AV graft for dialysis ([place])   Anxiety   Anemia due to chronic kidney disease   Assessment & Plan:   Atrial flutter with rapid ventricular response ([place]) - resolved with cardizem gtt transitioned to oral cardizem.  - RVR resolved.  Anemia -  anemia of chronic disease, end-stage renal disease -possibly exacerbated from mild bleeding/oozing left arm AV access. - Pt on room air with no accesory muscle use - s/p transfusion, monitoring H&H  Hyperkalemia -improved Resolved, on hemodialysis   COPD exacerbation (HCC) - No wheezes on exam will d/c solumedrol. Continue bronchodilators.     Acute respiratory failure with hypoxia  -  Resolved patient on room air.    CKD (chronic kidney disease) stage  V requiring chronic dialysis [facility]) - Nephrology consulted for management of dialysis, hemodialysis Mondays Wednesdays and Fridays.  Swelling hemodialysis today    History of Cocaine abuse -patient has been advised strongly to abstain from substance abuse including cocaine    Hypertension - stable on on PO cardizem, imdur    Bleeding for LUE - per nursing has stopped.  Will monitor blood levels.    Anxiety   - stable currently  DVT prophylaxis: early ambulation Code Status: Full Family Communication: none at bedside. Disposition Plan: Once blood levels stable   Consultants:   Psychiatry Nephrology Vascular surgery   Procedures: none   Antimicrobials: none  Antimicrobials:  Anti-infectives (From admission, onward)   None      Objective: Vitals:   06/22/17 0351 06/22/17 0459 06/22/17 0531 06/22/17 0743  BP: (!) 116/57  (!) 114/53 130/68  Pulse: 72   90  Resp: 14   13  Temp: 98.4 F (36.9 C)   (!) 97.4 F (36.3 C)  TempSrc: Oral   Axillary  SpO2: 99%   (!) 81%  Weight:  79.2 kg (174 lb 9.6 oz)    Height:        Intake/Output Summary (Last 24 hours) at 06/22/2017 1318 Last data filed at 06/22/2017 1100 Gross per 24 hour  Intake 899 ml  Output -  Net 899 ml   Filed Weights   06/20/17 2347 06/21/17 0500 06/22/17 0459  Weight: 79.6 kg (175 lb 7.8 oz) 78 kg (171 lb 15.3  oz) 79.2 kg (174 lb 9.6 oz)    Examination:  General exam: Appears calm and comfortable  Respiratory system: Clear to auscultation. Respiratory effort normal. Cardiovascular system: S1 & S2 heard, RRR. No JVD, murmurs, rubs, gallops or clicks. No pedal edema. Gastrointestinal system: Abdomen is nondistended, soft and nontender. No organomegaly or masses felt. Normal bowel sounds heard. Central nervous system: Alert and oriented. No focal neurological deficits. Extremities: Symmetric 5 x 5 power. Skin: No rashes, lesions or ulcers Psychiatry: Judgement and insight appear normal.  Mood & affect appropriate.     Data Reviewed: I have personally reviewed following labs and imaging studies  CBC: Recent Labs  Lab 06/18/17 0929  06/19/17 0259 06/19/17 2030 06/20/17 1056 06/20/17 2005 06/21/17 1648  WBC 6.7  --  7.2 13.6*  --  13.2* 13.0*  NEUTROABS 4.7  --   --  11.3*  --   --   --   HGB 7.0*   < > 6.5* 6.8* 7.0* 6.3* 8.3*  HCT 21.0*   < > 19.5* 20.7* 21.5* 19.2* 25.4*  MCV 83.7  --  83.0 81.8  --  82.4 83.3  PLT 193  --  213 235  --  234 280   < > = values in this interval not displayed.   Basic Metabolic Panel: Recent Labs  Lab 06/16/17 0431 06/18/17 0925 06/18/17 0929 06/18/17 0934 06/19/17 0259 06/20/17 2005 06/22/17 0239  NA 134*  --  131* 134* 132* 130* 130*  K 4.4  --  4.3 4.4 5.2* 5.2* 4.6  CL 95*  --  94* 93* 92* 90* 93*  CO2 29  --  28  --  29 30 27   GLUCOSE 115*  --  112* 109* 165* 147* 205*  BUN 19  --  15 16 25* 49* 33*  CREATININE 4.90*  --  5.68* 5.40* 6.94* 8.63* 5.99*  CALCIUM 8.7*  --  8.6*  --  8.9 8.7* 9.3  MG  --  1.6*  --   --   --   --   --   PHOS  --   --   --   --   --  3.1  --    GFR: Estimated Creatinine Clearance: 14.1 mL/min (A) (by C-G formula based on SCr of 5.99 mg/dL (H)). Liver Function Tests: Recent Labs  Lab 06/18/17 0929 06/19/17 0259 06/20/17 2005  AST 24 24  --   ALT 11* 11*  --   ALKPHOS 113 110  --   BILITOT 0.7 1.1  --   PROT 5.4* 5.4*  --   ALBUMIN 2.3* 2.4* 2.6*   No results for input(s): LIPASE, AMYLASE in the last 168 hours. No results for input(s): AMMONIA in the last 168 hours. Coagulation Profile: No results for input(s): INR, PROTIME in the last 168 hours. Cardiac Enzymes: No results for input(s): CKTOTAL, CKMB, CKMBINDEX, TROPONINI in the last 168 hours. BNP (last 3 results) No results for input(s): PROBNP in the last 8760 hours. HbA1C: No results for input(s): HGBA1C in the last 72 hours. CBG: No results for input(s): GLUCAP in the last 168 hours. Lipid Profile: No results  for input(s): CHOL, HDL, LDLCALC, TRIG, CHOLHDL, LDLDIRECT in the last 72 hours. Thyroid Function Tests: No results for input(s): TSH, T4TOTAL, FREET4, T3FREE, THYROIDAB in the last 72 hours. Anemia Panel: Recent Labs    06/20/17 2335  TIBC 318  IRON 115   Sepsis Labs: Recent Labs  Lab 06/18/17 0925  PROCALCITON 0.67  Recent Results (from the past 240 hour(s))  Aerobic/Anaerobic Culture (surgical/deep wound)     Status: None   Collection Time: 06/14/17  5:19 PM  Result Value Ref Range Status   Specimen Description ARM LEFT  Final   Special Requests NONE  Final   Gram Stain   Final    FEW WBC PRESENT, PREDOMINANTLY PMN NO ORGANISMS SEEN    Culture   Final    RARE NORMAL SKIN FLORA NO ANAEROBES ISOLATED Performed at [facility], [address] [place], [place] [address]    Report Status 06/21/2017 FINAL  Final  Respiratory Panel by PCR     Status: None   Collection Time: 06/18/17  6:28 PM  Result Value Ref Range Status   Adenovirus NOT DETECTED NOT DETECTED Final   Coronavirus 229E NOT DETECTED NOT DETECTED Final   Coronavirus HKU1 NOT DETECTED NOT DETECTED Final   Coronavirus NL63 NOT DETECTED NOT DETECTED Final   Coronavirus OC43 NOT DETECTED NOT DETECTED Final   Metapneumovirus NOT DETECTED NOT DETECTED Final   Rhinovirus / Enterovirus NOT DETECTED NOT DETECTED Final   Influenza A NOT DETECTED NOT DETECTED Final   Influenza B NOT DETECTED NOT DETECTED Final   Parainfluenza Virus 1 NOT DETECTED NOT DETECTED Final   Parainfluenza Virus 2 NOT DETECTED NOT DETECTED Final   Parainfluenza Virus 3 NOT DETECTED NOT DETECTED Final   Parainfluenza Virus 4 NOT DETECTED NOT DETECTED Final   Respiratory Syncytial Virus NOT DETECTED NOT DETECTED Final   Bordetella pertussis NOT DETECTED NOT DETECTED Final   Chlamydophila pneumoniae NOT DETECTED NOT DETECTED Final   Mycoplasma pneumoniae NOT DETECTED NOT DETECTED Final  MRSA PCR Screening     Status: None    Collection Time: 06/18/17  6:28 PM  Result Value Ref Range Status   MRSA by PCR NEGATIVE NEGATIVE Final    Comment:        The GeneXpert MRSA Assay (FDA approved for NASAL specimens only), is one component of a comprehensive MRSA colonization surveillance program. It is not intended to diagnose MRSA infection nor to guide or monitor treatment for MRSA infections.        Radiology Studies: No results found.  Scheduled Meds: . amLODipine  5 mg Oral BID  . calcitRIOL  0.75 mcg Oral Q M,W,F-HD  . darbepoetin (ARANESP) injection - DIALYSIS  200 mcg Intravenous Q Mon-HD  . diltiazem  30 mg Oral Q6H  . gabapentin  100 mg Oral BID  . isosorbide mononitrate  30 mg Oral Daily  . minoxidil  2.5 mg Oral BID  . multivitamin  1 tablet Oral QHS  . nicotine  14 mg Transdermal Daily  . sevelamer carbonate  3,200 mg Oral TID WC  . sodium chloride flush  3 mL Intravenous Q12H   Continuous Infusions: . sodium chloride    . sodium chloride    . sodium chloride    . sodium chloride       LOS: 4 days    Time spent: >25 minutes   Deatra James, MD [facility] Pager [phone]  If 7PM-7AM, please contact night-coverage [email or web address] Password [email or web address] 06/22/2017, 1:18 PM

## 2017-06-22 NOTE — Progress Notes (Signed)
[facility] Progress Note  Subjective: no c/o  Vitals:   06/22/17 0351 06/22/17 0459 06/22/17 0531 06/22/17 0743  BP: (!) 116/57  (!) 114/53 130/68  Pulse: 72   90  Resp: 14   13  Temp: 98.4 F (36.9 C)   (!) 97.4 F (36.3 C)  TempSrc: Oral   Axillary  SpO2: 99%   (!) 81%  Weight:  79.2 kg (174 lb 9.6 oz)    Height:        Inpatient medications: . amLODipine  5 mg Oral BID  . calcitRIOL  0.75 mcg Oral Q M,W,F-HD  . darbepoetin (ARANESP) injection - DIALYSIS  200 mcg Intravenous Q Mon-HD  . diltiazem  30 mg Oral Q6H  . gabapentin  100 mg Oral BID  . isosorbide mononitrate  30 mg Oral Daily  . minoxidil  5 mg Oral BID  . multivitamin  1 tablet Oral QHS  . nicotine  14 mg Transdermal Daily  . sevelamer carbonate  3,200 mg Oral TID WC  . sodium chloride flush  3 mL Intravenous Q12H   . sodium chloride    . sodium chloride    . sodium chloride    . sodium chloride     sodium chloride, sodium chloride, acetaminophen **OR** acetaminophen, alteplase, camphor-menthol, heparin, levalbuterol, lidocaine (PF), lidocaine-prilocaine, minoxidil, pentafluoroprop-tetrafluoroeth, zolpidem  Exam: Alert, no distress L upper arm in large bandage No jvd Chest dec'd BS throughout, no rales/ wheezes Cor regular, no RG Abd soft, ntnd, no ascites Ext trace - 1+ edema NF, ox 3  CXR 12/8 - no CHF, question LLL patchy infiltrate  Home meds: -norvasc 10/ imdur 20/ minoxidil 10 bid -renvela 4 ac/ MVI/ rocaltrol w hd -neurontin 100 bid/ ambien hs prn/ oxycodone 5 bid prn -plavix 75 qd -nicotine patch/ vit C -prn zofran/ tylenol/ albuterol/ senokot   D[facility]: [facility]  4h    2/2 bath  72.5kg (from early Nov)  Hep 3000   R IJ TDC -tsat 20%, Hb 9.4 early nov -mircera 200 ug last 11/14 -sensipar 120 w/ HD tiw       Impression: 1. AFib /RVR - per primary, started po dilt 30 qid 2. HTN - bp's down; on po dilt for afib, cont norvasc, lower minoxidil 2.5 bid 3. Bleeding  wound LUA - per VVS, doing better today. Question of infection, on empiric IV abx vanc/ zosyn.  4. SOB - on Rx for COPD w/ steroids, nebs. Last CXR 12/8 no edema 5. Volume - up 7kg today, max UF on HD today 6. ESRD on HD MWF.  HD via [facility], failed LUA AVF last admit. Hold hep with HD for now w bleeding L arm 7. MBD of CKD - need to reassess meds, hasn't been to center in a month 8. Anemia of CKD/ ABL - Hb 7 after 2u prbc, due to blood loss from L arm wound. Resumed ESA here w darbe 200 ug q Monday, last dose 12/10 here. Fe tsat is ok at 36% here.  9. COPD 10. CAD hx RCA stent Aug 2018 - on plavix  Plan - HD today, max UF, lower minox back to 2.5 bid, need BP to get volume off   [name] MD [place] pager [phone]   06/22/2017, 12:56 PM   Recent Labs  Lab 06/19/17 0259 06/20/17 2005 06/22/17 0239  NA 132* 130* 130*  K 5.2* 5.2* 4.6  CL 92* 90* 93*  CO2 29 30 27   GLUCOSE 165*  147* 205*  BUN 25* 49* 33*  CREATININE 6.94* 8.63* 5.99*  CALCIUM 8.9 8.7* 9.3  PHOS  --  3.1  --    Recent Labs  Lab 06/18/17 0929 06/19/17 0259 06/20/17 2005  AST 24 24  --   ALT 11* 11*  --   ALKPHOS 113 110  --   BILITOT 0.7 1.1  --   PROT 5.4* 5.4*  --   ALBUMIN 2.3* 2.4* 2.6*   Recent Labs  Lab 06/18/17 0929  06/19/17 2030 06/20/17 1056 06/20/17 2005 06/21/17 1648  WBC 6.7   < > 13.6*  --  13.2* 13.0*  NEUTROABS 4.7  --  11.3*  --   --   --   HGB 7.0*   < > 6.8* 7.0* 6.3* 8.3*  HCT 21.0*   < > 20.7* 21.5* 19.2* 25.4*  MCV 83.7   < > 81.8  --  82.4 83.3  PLT 193   < > 235  --  234 280   < > = values in this interval not displayed.   Iron/TIBC/Ferritin/ %Sat    Component Value Date/Time   IRON 115 06/20/2017 2335   TIBC 318 06/20/2017 2335   FERRITIN 550 (H) 06/25/2011 0837   IRONPCTSAT 36 06/20/2017 2335

## 2017-06-22 NOTE — Progress Notes (Signed)
[facility]  Subjective  - feels ok   Objective 119/65 95 97.9 F (36.6 C) (Oral) 13 90%  Intake/Output Summary (Last 24 hours) at 06/22/2017 1540 Last data filed at 06/22/2017 1100 Gross per 24 hour  Intake 899 ml  Output -  Net 899 ml    Pt did not want wound examined today  Assessment/Planning:  Will recheck tomorrow if still in hospital  Otherwise can follow with me in 1-2 weeks  Ruta Hinds 06/22/2017 3:40 PM --  Laboratory Lab Results: Recent Labs    06/20/17 2005 06/21/17 1648  WBC 13.2* 13.0*  HGB 6.3* 8.3*  HCT 19.2* 25.4*  PLT 234 280   BMET Recent Labs    06/20/17 2005 06/22/17 0239  NA 130* 130*  K 5.2* 4.6  CL 90* 93*  CO2 30 27  GLUCOSE 147* 205*  BUN 49* 33*  CREATININE 8.63* 5.99*  CALCIUM 8.7* 9.3    COAG Lab Results  Component Value Date   INR 1.20 06/14/2017   INR 1.20 06/04/2017   INR 1.17 06/03/2017   No results found for: PTT

## 2017-06-22 NOTE — Progress Notes (Signed)
Gave report to HD RN, she stated pt would receive calcitriol while in HD.

## 2017-06-23 ENCOUNTER — Ambulatory Visit: Payer: Self-pay | Admitting: Adult Health

## 2017-06-23 LAB — BASIC METABOLIC PANEL
Anion gap: 10 (ref 5–15)
BUN: 20 mg/dL (ref 6–20)
CO2: 27 mmol/L (ref 22–32)
Calcium: 9.1 mg/dL (ref 8.9–10.3)
Chloride: 95 mmol/L — ABNORMAL LOW (ref 101–111)
Creatinine, Ser: 4.39 mg/dL — ABNORMAL HIGH (ref 0.61–1.24)
GFR calc Af Amer: 16 mL/min — ABNORMAL LOW (ref >60)
GFR calc non Af Amer: 14 mL/min — ABNORMAL LOW (ref >60)
Glucose, Bld: 99 mg/dL (ref 65–99)
Potassium: 4.3 mmol/L (ref 3.5–5.1)
Sodium: 132 mmol/L — ABNORMAL LOW (ref 135–145)

## 2017-06-23 LAB — CBC
HCT: 23.5 % — ABNORMAL LOW (ref 39.0–52.0)
Hemoglobin: 7.7 g/dL — ABNORMAL LOW (ref 13.0–17.0)
MCH: 28 pg (ref 26.0–34.0)
MCHC: 32.8 g/dL (ref 30.0–36.0)
MCV: 85.5 fL (ref 78.0–100.0)
Platelets: 237 10*3/uL (ref 150–400)
RBC: 2.75 MIL/uL — ABNORMAL LOW (ref 4.22–5.81)
RDW: 19.7 % — ABNORMAL HIGH (ref 11.5–15.5)
WBC: 11.7 10*3/uL — ABNORMAL HIGH (ref 4.0–10.5)

## 2017-06-23 MED ORDER — DARBEPOETIN ALFA 200 MCG/0.4ML IJ SOSY: 200.0000 ug | PREFILLED_SYRINGE | INTRAMUSCULAR | 0 refills | Status: DC

## 2017-06-23 MED ORDER — MINOXIDIL 2.5 MG PO TABS: 2.5000 mg | ORAL_TABLET | Freq: Two times a day (BID) | ORAL | 0 refills | Status: DC

## 2017-06-23 MED ORDER — ACETAMINOPHEN 325 MG PO TABS: 650.0000 mg | ORAL_TABLET | Freq: Four times a day (QID) | ORAL | 0 refills | Status: DC | PRN

## 2017-06-23 MED ORDER — DILTIAZEM HCL ER COATED BEADS 120 MG PO CP24: 120.0000 mg | ORAL_CAPSULE | Freq: Every day | ORAL | 1 refills | Status: DC

## 2017-06-23 NOTE — Progress Notes (Signed)
Physical Therapy Treatment Patient Details Name: [name] MRN: [id number] DOB: July 11, 1961 Today's Date: 06/23/2017    History of Present Illness Pt is a 56 y.o. male who presents with atrial fibrillation with rapid ventricular response mild COPD exacerbation with hypoxemia; PTA he was at [facility] for anxiety, when he developed He developed symptoms consistent with an infected left forearm AV graft dialysis access that required I&D and placement of wound VAC    PT Comments    Pt performed gait and stair training in prep for d.c home this afternoon.  Pt tolerated mobility well and will f/u with in home therapy for continued progression of strength.     Follow Up Recommendations  Home health PT     Equipment Recommendations  Other (comment)(none)    Recommendations for Other Services       Precautions / Restrictions Precautions Precautions: Fall Restrictions Weight Bearing Restrictions: No    Mobility  Bed Mobility Overal bed mobility: Independent             General bed mobility comments: Received sitting edge of bed.    Transfers Overall transfer level: Modified independent Equipment used: None Transfers: Sit to/from Stand Sit to Stand: Modified independent (Device/Increase time)         General transfer comment: No assistance needed.    Ambulation/Gait Ambulation/Gait assistance: Supervision Ambulation Distance (Feet): 200 Feet Assistive device: None Gait Pattern/deviations: Step-through pattern;Drifts right/left Gait velocity: Decreased Gait velocity interpretation: Below normal speed for age/gender General Gait Details: Pt continues to drift to the R and L during gait but no overt LOB.  Pt attempting to put on chap stick during ambulation and able to maintain balance.     Stairs Stairs: Yes   Stair Management: One rail Right Number of Stairs: 16 General stair comments: Pt performed the flight of stairs and intially  required supervision for safety with step to pattern and progressed to reciprical negotiation of stairs.    Wheelchair Mobility    Modified Rankin (Stroke Patients Only)       Balance Overall balance assessment: Needs assistance   Sitting balance-Leahy Scale: Good       Standing balance-Leahy Scale: Fair                              Cognition Arousal/Alertness: Awake/alert Behavior During Therapy: WFL for tasks assessed/performed Overall Cognitive Status: Within Functional Limits for tasks assessed                                 General Comments: Max encouragement to participate      Exercises      General Comments        Pertinent Vitals/Pain Pain Assessment: 0-10 Faces Pain Scale: Hurts a little bit Pain Location: LUE Pain Descriptors / Indicators: Sore;Discomfort Pain Intervention(s): Monitored during session;Repositioned    Home Living                      Prior Function            PT Goals (current goals can now be found in the care plan section) Acute Rehab PT Goals Patient Stated Goal: Return home Potential to Achieve Goals: Good Progress towards PT goals: Progressing toward goals    Frequency    Min 3X/week      PT Plan Current plan remains  appropriate    Co-evaluation              AM-PAC PT "6 Clicks" Daily Activity  Outcome Measure  Difficulty turning over in bed (including adjusting bedclothes, sheets and blankets)?: None Difficulty moving from lying on back to sitting on the side of the bed? : None Difficulty sitting down on and standing up from a chair with arms (e.g., wheelchair, bedside commode, etc,.)?: None Help needed moving to and from a bed to chair (including a wheelchair)?: None Help needed walking in hospital room?: None Help needed climbing 3-5 steps with a railing? : None 6 Click Score: 24    End of Session Equipment Utilized During Treatment: Gait belt Activity Tolerance:  Patient tolerated treatment well Patient left: with call bell/phone within reach(Pt left standing in room.  ) Nurse Communication: Mobility status PT Visit Diagnosis: Unsteadiness on feet (R26.81)     Time: 0258-5277 PT Time Calculation (min) (ACUTE ONLY): 15 min  Charges:  $Gait Training: 8-22 mins                    G Codes:       Governor Rooks, PTA pager [place] 06/23/2017, 5:31 PM

## 2017-06-23 NOTE — Progress Notes (Signed)
[facility]  Subjective  - feels ok   Objective (!) 141/81 87 97.8 F (36.6 C) (Axillary) 14 98%  Intake/Output Summary (Last 24 hours) at 06/23/2017 0816 Last data filed at 06/22/2017 2000 Gross per 24 hour  Intake 300 ml  Output 5000 ml  Net -4700 ml   Left arm wound clean  No bleeding  Assessment/Planning: Healing left arm wound Needs follow up with me in 2 weeks Continue hydrogel dressing If remains in hospital will recheck early next week  Ruta Hinds 06/23/2017 8:16 AM --  Laboratory Lab Results: Recent Labs    06/22/17 1942 06/23/17 0434  WBC 14.6* 11.7*  HGB 8.9* 7.7*  HCT 27.2* 23.5*  PLT 266 237   BMET Recent Labs    06/22/17 0239 06/23/17 0434  NA 130* 132*  K 4.6 4.3  CL 93* 95*  CO2 27 27  GLUCOSE 205* 99  BUN 33* 20  CREATININE 5.99* 4.39*  CALCIUM 9.3 9.1    COAG Lab Results  Component Value Date   INR 1.20 06/14/2017   INR 1.20 06/04/2017   INR 1.17 06/03/2017   No results found for: PTT

## 2017-06-23 NOTE — Progress Notes (Signed)
[facility] Progress Note  Subjective: no c/o today, got 5L off with HD yest and almost down to dry wt today  Vitals:   06/23/17 0140 06/23/17 0512 06/23/17 0715 06/23/17 1052  BP: 132/77 130/62 (!) 141/81   Pulse:  99 87   Resp:  14 14   Temp:  98 F (36.7 C) 97.8 F (36.6 C)   TempSrc:  Axillary Axillary   SpO2:  100% 98%   Weight:  78.7 kg (173 lb 8 oz)  73.5 kg (162 lb 0.6 oz)  Height:        Inpatient medications: . amLODipine  5 mg Oral BID  . calcitRIOL  0.75 mcg Oral Q M,W,F-HD  . darbepoetin (ARANESP) injection - DIALYSIS  200 mcg Intravenous Q Mon-HD  . diltiazem  30 mg Oral Q6H  . gabapentin  100 mg Oral BID  . isosorbide mononitrate  30 mg Oral Daily  . minoxidil  2.5 mg Oral BID  . multivitamin  1 tablet Oral QHS  . nicotine  14 mg Transdermal Daily  . sevelamer carbonate  3,200 mg Oral TID WC  . sodium chloride flush  3 mL Intravenous Q12H   . sodium chloride    . sodium chloride     acetaminophen **OR** acetaminophen, camphor-menthol, levalbuterol, zolpidem  Exam: Alert, no distress L upper arm in large bandage No jvd Chest dec'd BS throughout, no rales/ wheezes Cor regular, no RG Abd soft, ntnd, no ascites Ext trace - 1+ edema NF, ox 3  CXR 12/8 - no CHF, question LLL patchy infiltrate  Home meds: -norvasc 10/ imdur 20/ minoxidil 10 bid -renvela 4 ac/ MVI/ rocaltrol w hd -neurontin 100 bid/ ambien hs prn/ oxycodone 5 bid prn -plavix 75 qd -nicotine patch/ vit C -prn zofran/ tylenol/ albuterol/ senokot   D[facility]: [facility]  4h    2/2 bath  72.5kg (from early Nov)  Hep 3000   R IJ TDC -tsat 20%, Hb 9.4 early nov -mircera 200 ug last 11/14 -sensipar 120 w/ HD tiw       Impression: 1. AFib /RVR - per primary, started po dilt 30 qid 2. HTN - bp's down; on po dilt for afib, cont norvasc and minoxidil 2.5 bid 3. Bleeding wound LUA - per VVS 4. SOB - likely edema + COPD, much better. Last CXR 12/8 no edema 5. Volume - only  1kg over dry wt  6. ESRD on HD MWF.  HD via TDC, removal LUA AVF last admit 7. MBD of CKD - need to reassess meds, hasn't been to center in a month 8. Anemia of CKD/ ABL - got 2u prbc this admission. Resumed ESA here w darbe 200 ug q Monday, last dose 12/10 here. Fe tsat is ok at 36% here.  9. COPD 10. CAD hx RCA stent Aug 2018 - on plavix 11. Dispo - per primary, stable for d/c from renal standpoint  Plan - as above   [name] MD [place] pager [phone]   06/23/2017, 11:09 AM   Recent Labs  Lab 06/20/17 2005 06/22/17 0239 06/23/17 0434  NA 130* 130* 132*  K 5.2* 4.6 4.3  CL 90* 93* 95*  CO2 30 27 27   GLUCOSE 147* 205* 99  BUN 49* 33* 20  CREATININE 8.63* 5.99* 4.39*  CALCIUM 8.7* 9.3 9.1  PHOS 3.1  --   --    Recent Labs  Lab 06/18/17 0929 06/19/17 0259 06/20/17 2005  AST 24 24  --  ALT 11* 11*  --   ALKPHOS 113 110  --   BILITOT 0.7 1.1  --   PROT 5.4* 5.4*  --   ALBUMIN 2.3* 2.4* 2.6*   Recent Labs  Lab 06/18/17 0929  06/19/17 2030  06/21/17 1648 06/22/17 1942 06/23/17 0434  WBC 6.7   < > 13.6*   < > 13.0* 14.6* 11.7*  NEUTROABS 4.7  --  11.3*  --   --   --   --   HGB 7.0*   < > 6.8*   < > 8.3* 8.9* 7.7*  HCT 21.0*   < > 20.7*   < > 25.4* 27.2* 23.5*  MCV 83.7   < > 81.8   < > 83.3 84.7 85.5  PLT 193   < > 235   < > 280 266 237   < > = values in this interval not displayed.   Iron/TIBC/Ferritin/ %Sat    Component Value Date/Time   IRON 115 06/20/2017 2335   TIBC 318 06/20/2017 2335   FERRITIN 550 (H) 06/25/2011 0837   IRONPCTSAT 36 06/20/2017 2335

## 2017-06-23 NOTE — Clinical Social Work Note (Signed)
Clinical Social Work Assessment  Patient Details  Name: [name] MRN: [id number] Date of Birth: 1961-01-07  Date of referral:  06/23/17               Reason for consult:  Substance Use/ETOH Abuse                Permission sought to share information with:    Permission granted to share information::  No  Name::        Agency::     Relationship::     Contact Information:     Housing/Transportation Living arrangements for the past 2 months:  Apartment Source of Information:  Patient, Medical Team Patient Interpreter Needed:  None Criminal Activity/Legal Involvement Pertinent to Current Situation/Hospitalization:  No - Comment as needed Significant Relationships:  Friend Lives with:  Self Do you feel safe going back to the place where you live?  Yes Need for family participation in patient care:  Yes (Comment)  Care giving concerns:  Substance abuse.   Social Worker assessment / plan:  CSW met with patient. No supports at bedside. CSW introduced role and inquired about need for substance abuse resources. Patient stated he has not used alcohol or drugs in one year. He only smokes cigarettes. Patient discussed his new relationship with God and how he hopes he can develop better relationships with his children. No further concerns. CSW signing off as patient has discharged home.  Employment status:  Disabled (Comment on whether or not currently receiving Disability) Insurance information:  Medicare PT Recommendations:  Home with [place] / Referral to community resources:  Residential Substance Abuse Treatment Options, Outpatient Substance Abuse Treatment Options  Patient/Family's Response to care:  Patient declined substance abuse resources. Patient's friend supportive and involved in patient's care. Patient appreciated social work intervention.  Patient/Family's Understanding of and Emotional Response to Diagnosis, Current Treatment, and Prognosis:   Patient has a good understanding of the reason for admission and social work consult. Patient appears happy with hospital care.  Emotional Assessment Appearance:  Appears stated age Attitude/Demeanor/Rapport:  Other(Pleasant) Affect (typically observed):  Accepting, Appropriate, Calm, Pleasant Orientation:  Oriented to Self, Oriented to Place, Oriented to  Time, Oriented to Situation Alcohol / Substance use:  Tobacco Use(Has not used alcohol or drugs in one years.) Psych involvement (Current and /or in the community):  No (Comment)  Discharge Needs  Concerns to be addressed:  Substance Abuse Concerns Readmission within the last 30 days:  Yes Current discharge risk:  None Barriers to Discharge:  No Barriers Identified   Candie Chroman, LCSW 06/23/2017, 3:56 PM

## 2017-06-23 NOTE — Discharge Summary (Signed)
Physician Discharge Summary      Patient: [name]                   Admit date: 06/18/2017   DOB: 26-Aug-1960             Discharge date:06/23/2017/2:12 PM RCB:[id number]                           PCP: [name], MD Recommendations for Outpatient Follow-up:   1.       Please follow-up with your scheduled hemodialysis Monday Wednesday Friday, please       follow-up with vascular surgery in 1 week 2. Please follow-up with your primary care physician within 1-2 weeks.   Discharge Condition: Stable  CODE STATUS:  Full code  Diet recommendation:  Cardiac,   Renal diet  ----------------------------------------------------------------------------------------------------------------------  Discharge Diagnoses:   Principal Problem:   Atrial flutter with rapid ventricular response (HCC) Active Problems:   COPD exacerbation (HCC)   Acute respiratory failure with hypoxia (HCC)   CKD (chronic kidney disease) stage V requiring chronic dialysis ([place])   History of Cocaine abuse ([place])   Hypertension   Infection of AV graft for dialysis ([place])   Anxiety   Anemia due to chronic kidney disease   History of present illness : [name] is a 56 y.o. male with medical history significant for CKD on HD (MWF), tobacco abuse and COPD, anemia of chronic disease, history of prior polysubstance abuse including cocaine abuse, hypertension, atrial flutter with prior DC CV but not on anticoagulation secondary to repeated issues with noncompliance and lack of commitment to follow-up.  This patient was recently discharged yesterday on 12/7 after a hospital admission at [facility].  The patient was initially admitted to behavioral health due to problems related to his anxiety and depression.  He developed symptoms consistent with an infected left forearm AV graft dialysis access that required I&D and placement of wound VAC.  Apparently the patient had done well after  discharge until he started developing some bleeding from the wound site and the [facility] was not working properly.  He reports he became anxious and this prompted him to have a COPD exacerbation with increased shortness of breath and documented hypoxemia with O2 sats down to 86-88% in the field.  Patient also admits to smoking 3 cigarettes yesterday after returning home.  He has not had any fever and has had a nonproductive cough.  In the ER there were no wound VAC nurses available to replace the device and appropriate equipment comparable to patient's device was not available.  The EDP then placed surgical foam with coagulant onto the wound and wrapped the arm and a semi-occlusive dressing without any further bleeding noted.  Patient was also wheezing profusely upon presentation and required administration of DuoNeb and Solu-Medrol.  Chest x-ray did not reveal any infection and he did not have leukocytosis or fever concerning for possible pneumonia process.  Since arrival to the ER the patient's tachycardia has worsened with ventricular rates in the 120-130 range.  EKG was consistent with atrial flutter.  Patient does have a history of atrial flutter but is not on anticoagulation as stated above.  Patient will be admitted for multiple problems including atrial flutter with RVR, COPD exacerbation with hypoxemia.   Hospital course / Brief Summary:  56 y.o.malewho presents with atrial fibrillation with rapid ventricular response mild COPD exacerbation with hypoxemia. Also had some  bleeding from left arm(AV access site)which vascular surgery was consulted for.  Principal Problem:   Atrial flutter with rapid ventricular response (HCC) Active Problems:   COPD exacerbation (HCC)   Acute respiratory failure with hypoxia (HCC)   CKD (chronic kidney disease) stage V requiring chronic dialysis ([place])   History of Cocaine abuse ([place])   Hypertension   Infection of AV graft for dialysis ([place])   Anxiety   Anemia  due to chronic kidney disease   Assessment & Plan:   Atrial flutter with rapid ventricular response ([place]) - resolved with cardizem gtttransitioned to oral cardizem, not to switch to Cardizem CD - RVR resolved.  Anemia -  anemia of chronic disease, end-stage renal disease -possibly exacerbated from mild bleeding/oozing left arm AV access. - Pt on room air with no accesory muscle use -s/p transfusion, monitoring H&H  Hyperkalemia -improved Resolved, on hemodialysis  COPD exacerbation (HCC) -No wheezes on exam will d/c solumedrol.  Continue home inhalers  Acute respiratory failure with hypoxia  - Resolved patient on room air.  CKD (chronic kidney disease) stage V requiring chronic dialysis [facility]) - Nephrology  Managed dialysis, hemodialysis Mondays Wednesdays and Fridays.   Status post hemodialysis yesterday.  Due for dialysis tomorrow.  History of Cocaine abuse -patient has been advised strongly to abstain from substance abuse including cocaine  Hypertension - Stable, Cardizem has been switched to long-acting once a day, continue M [place]  Bleeding for LUE -per nursing has stopped.    Evaluated by vascular surgeon, currently stopped bleeding, may follow as an outpatient  Anxiety - stable currently  DVT prophylaxis:early ambulation Code Status:Full Family Communication:none at bedside.   Disposition Plan: Patient was deemed stable today to be discharged, his bleeding has stopped, no respiratory distress.  Final plan of care-I discussed the patient detail he can express understanding and agree with above plan.  Consultants:  Psychiatry/ Nephrology Vascular surgery   Procedures: Hemodialysis  ----------------------------------------------------------------------------------------------------------------------  Discharge Instructions:   Discharge Instructions    Activity as tolerated - No restrictions   Complete by:  As  directed    Diet - low sodium heart healthy   Complete by:  As directed    Cardiac/renal diet   Discharge instructions   Complete by:  As directed    Continue current medication, may discuss with your nephrologist on modifying medications Discontinue your hemodialysis schedule Holding your Plavix secondary to fistula bleeding, after following up with vascular surgeon to discuss restarting the medication Plavix.   Increase activity slowly   Complete by:  As directed        Medication List    STOP taking these medications   albuterol 108 (90 Base) MCG/ACT inhaler Commonly known as:  PROVENTIL HFA;VENTOLIN HFA   amLODipine 10 MG tablet Commonly known as:  NORVASC   amoxicillin-clavulanate 500-125 MG tablet Commonly known as:  AUGMENTIN   clopidogrel 75 MG tablet Commonly known as:  PLAVIX   feeding supplement (NEPRO CARB STEADY) Liqd   multivitamin-lutein Caps capsule   ondansetron 4 MG tablet Commonly known as:  ZOFRAN   oxycodone 5 MG capsule Commonly known as:  OXY-IR     TAKE these medications   acetaminophen 325 MG tablet Commonly known as:  TYLENOL Take 2 tablets (650 mg total) by mouth every 6 (six) hours as needed for mild pain (or Fever >/= 101).   AEROCHAMBER MV inhaler Use as instructed   ascorbic acid 500 MG tablet Commonly known as:  VITAMIN C Take  1 tablet (500 mg total) by mouth 2 (two) times daily.   budesonide-formoterol 160-4.5 MCG/ACT inhaler Commonly known as:  SYMBICORT Inhale 2 puffs into the lungs every 12 (twelve) hours.   calcitRIOL 0.25 MCG capsule Commonly known as:  ROCALTROL Take 3 capsules (0.75 mcg total) by mouth every Monday, Wednesday, and Friday with hemodialysis.   Darbepoetin Alfa 200 MCG/0.4ML Sosy injection Commonly known as:  ARANESP Inject 0.4 mLs (200 mcg total) into the vein every Monday with hemodialysis. Start taking on:  06/27/2017   diltiazem 120 MG 24 hr capsule Commonly known as:  CARDIZEM CD Take 1  capsule (120 mg total) by mouth daily.   gabapentin 100 MG capsule Commonly known as:  NEURONTIN Take 100 mg 2 (two) times daily by mouth.   isosorbide mononitrate 30 MG 24 hr tablet Commonly known as:  IMDUR Take 1 tablet (30 mg total) by mouth daily.   minoxidil 2.5 MG tablet Commonly known as:  LONITEN Take 1 tablet (2.5 mg total) by mouth 2 (two) times daily.   multivitamin Tabs tablet Take 1 tablet by mouth at bedtime.   nicotine 14 mg/24hr patch Commonly known as:  NICODERM CQ - dosed in mg/24 hours Place 14 mg daily onto the skin.   senna 8.6 MG Tabs tablet Commonly known as:  SENOKOT Take 1 tablet (8.6 mg total) by mouth daily as needed for mild constipation.   sevelamer carbonate 800 MG tablet Commonly known as:  RENVELA Take 4 tablets (3,200 mg total) by mouth 3 (three) times daily with meals.   zolpidem 10 MG tablet Commonly known as:  AMBIEN Take 1 tablet (10 mg total) by mouth at bedtime as needed for sleep.      Follow-up Information    Elam Dutch, MD In 2 weeks.   Specialties:  Vascular Surgery, Cardiology Why:  Office will call you to arrange your appt (sent) Contact information: [address] [place] [address] [phone]          Allergies  Allergen Reactions  . Aspirin Other (See Comments)    STOMACH PAIN  . Clonidine Derivatives Itching  . Tramadol Itching      Procedures/Studies: Ct Humerus Left W Contrast  Result Date: 06/13/2017 CLINICAL DATA:  Dialysis patient with a history of an arteriovenous fistula and an open wound on the left upper arm. Question abscess. EXAM: CT OF THE UPPER LEFT EXTREMITY WITH CONTRAST TECHNIQUE: Multidetector CT imaging of the upper left extremity was performed according to the standard protocol following intravenous contrast administration. COMPARISON:  None. CONTRAST:  100 ml ISOVUE-300 IOPAMIDOL (ISOVUE-300) INJECTION 61% FINDINGS: Bones/Joint/Cartilage No acute or focal abnormality is  identified. No periosteal reaction is seen. Ligaments Suboptimally assessed by CT. Muscles and Tendons No intramuscular fluid collection is seen. Musculature appears somewhat edematous, particularly the biceps, suggestive of infectious or inflammatory change. Soft tissues There is stranding in subcutaneous fat diffusely consistent with cellulitis. No soft tissue gas is identified. Skin defect just superior to the elbow correlates with the patient's history of nonhealing wound. There is partial visualization of a small left pleural effusion. Atherosclerotic vascular disease is noted. Prominent left axillary lymph nodes have clearly visible fatty hila no most consistent with reactive change. IMPRESSION: Soft tissue wound anterior aspect of the left upper arm without underlying abscess or evidence of osteomyelitis. Diffuse subcutaneous edema about the upper arm could be due to volume overload or cellulitis. Musculature of the upper arm appears somewhat edematous which could be due to  infectious or inflammatory change or volume overload. Small right pleural effusion. Atherosclerosis. Electronically Signed   By: Inge Rise M.D.   On: 06/13/2017 11:46   Dg Chest Port 1 View  Result Date: 06/18/2017 CLINICAL DATA:  56 year old male with shortness of breath. EXAM: PORTABLE CHEST 1 VIEW COMPARISON:  05/30/2017 FINDINGS: Right-sided IJ central venous catheter appears in stable condition. The cardiomediastinal silhouette is enlarged but unchanged. There is pulmonary vascular congestion. Stable elevation of the left hemidiaphragm with associated atelectasis. Early developing infiltrate in this region is difficult to entirely exclude. The right lung is clear. No sizable pleural effusions or pneumothorax. No acute osseous abnormalities. IMPRESSION: Stable cardiomegaly with left basilar atelectasis versus developing infiltrate. Electronically Signed   By: Kristopher Oppenheim M.D.   On: 06/18/2017 09:09   Dg Chest Port 1  View  Result Date: 05/30/2017 CLINICAL DATA:  Central line placement EXAM: PORTABLE CHEST 1 VIEW COMPARISON:  Portable exam 1813 hours compared to 05/29/2017 FINDINGS: Large bore dual-lumen RIGHT jugular central venous catheter with tip projecting over RIGHT atrium. Enlargement of cardiac silhouette with pulmonary vascular congestion. Atherosclerotic calcification aorta. Minimal perihilar infiltrates likely pulmonary edema. No gross pleural effusion or pneumothorax. Osseous structures unremarkable. IMPRESSION: No pneumothorax following RIGHT jugular line placement. Enlargement of cardiac silhouette with minimal pulmonary edema. Electronically Signed   By: Lavonia Dana M.D.   On: 05/30/2017 18:50   Dg Chest Portable 1 View  Result Date: 05/29/2017 CLINICAL DATA:  Shortness of Breath EXAM: PORTABLE CHEST 1 VIEW COMPARISON:  05/22/2017 FINDINGS: Cardiac shadow remains enlarged. Aortic calcifications are again seen. The lungs are well aerated bilaterally with mild interstitial changes consistent with early edema. No focal infiltrate or sizable effusion is noted. IMPRESSION: Mild interstitial edema. Electronically Signed   By: Inez Catalina M.D.   On: 05/29/2017 20:29   Dg Fluoro Guide Cv Line-no Report  Result Date: 05/30/2017 Fluoroscopy was utilized by the requesting physician.  No radiographic interpretation.      Subjective: Patient was seen and examined 06/23/2017, 2:12 PM Patient stable  Today. No acute distress.  No issues overnight Stable for discharge.  Discharge Exam:  Vitals:   06/23/17 0512 06/23/17 0715 06/23/17 1052 06/23/17 1147  BP: 130/62 (!) 141/81  123/69  Pulse: 99 87    Resp: 14 14  15   Temp: 98 F (36.7 C) 97.8 F (36.6 C)  97.7 F (36.5 C)  TempSrc: Axillary Axillary  Oral  SpO2: 100% 98%  98%  Weight: 78.7 kg (173 lb 8 oz)  73.5 kg (162 lb 0.6 oz)   Height:        General: Pt lying comfortably in bed & appears in no obvious distress. Cardiovascular: S1 & S2  heard, RRR, S1/S2 +. No murmurs, rubs, gallops or clicks. No JVD or pedal edema. Respiratory: Clear to auscultation without wheezing, rhonchi or crackles. No increased work of breathing. Abdominal:  Non distended, non tender & soft. No organomegaly or masses appreciated. Normal bowel sounds heard. CNS: Alert and oriented. No focal deficits. Extremities: no edema, no cyanosis    The results of significant diagnostics from this hospitalization (including imaging, microbiology, ancillary and laboratory) are listed below for reference.     Microbiology: Recent Results (from the past 240 hour(s))  Aerobic/Anaerobic Culture (surgical/deep wound)     Status: None   Collection Time: 06/14/17  5:19 PM  Result Value Ref Range Status   Specimen Description ARM LEFT  Final   Special Requests NONE  Final  Gram Stain   Final    FEW WBC PRESENT, PREDOMINANTLY PMN NO ORGANISMS SEEN    Culture   Final    RARE NORMAL SKIN FLORA NO ANAEROBES ISOLATED Performed at [facility], [address], [place], [place] [address]    Report Status 06/21/2017 FINAL  Final  Respiratory Panel by PCR     Status: None   Collection Time: 06/18/17  6:28 PM  Result Value Ref Range Status   Adenovirus NOT DETECTED NOT DETECTED Final   Coronavirus 229E NOT DETECTED NOT DETECTED Final   Coronavirus HKU1 NOT DETECTED NOT DETECTED Final   Coronavirus NL63 NOT DETECTED NOT DETECTED Final   Coronavirus OC43 NOT DETECTED NOT DETECTED Final   Metapneumovirus NOT DETECTED NOT DETECTED Final   Rhinovirus / Enterovirus NOT DETECTED NOT DETECTED Final   Influenza A NOT DETECTED NOT DETECTED Final   Influenza B NOT DETECTED NOT DETECTED Final   Parainfluenza Virus 1 NOT DETECTED NOT DETECTED Final   Parainfluenza Virus 2 NOT DETECTED NOT DETECTED Final   Parainfluenza Virus 3 NOT DETECTED NOT DETECTED Final   Parainfluenza Virus 4 NOT DETECTED NOT DETECTED Final   Respiratory Syncytial Virus NOT DETECTED NOT DETECTED  Final   Bordetella pertussis NOT DETECTED NOT DETECTED Final   Chlamydophila pneumoniae NOT DETECTED NOT DETECTED Final   Mycoplasma pneumoniae NOT DETECTED NOT DETECTED Final  MRSA PCR Screening     Status: None   Collection Time: 06/18/17  6:28 PM  Result Value Ref Range Status   MRSA by PCR NEGATIVE NEGATIVE Final    Comment:        The GeneXpert MRSA Assay (FDA approved for NASAL specimens only), is one component of a comprehensive MRSA colonization surveillance program. It is not intended to diagnose MRSA infection nor to guide or monitor treatment for MRSA infections.      Labs: CBC: Recent Labs  Lab 06/18/17 0929  06/19/17 2030 06/20/17 1056 06/20/17 2005 06/21/17 1648 06/22/17 1942 06/23/17 0434  WBC 6.7   < > 13.6*  --  13.2* 13.0* 14.6* 11.7*  NEUTROABS 4.7  --  11.3*  --   --   --   --   --   HGB 7.0*   < > 6.8* 7.0* 6.3* 8.3* 8.9* 7.7*  HCT 21.0*   < > 20.7* 21.5* 19.2* 25.4* 27.2* 23.5*  MCV 83.7   < > 81.8  --  82.4 83.3 84.7 85.5  PLT 193   < > 235  --  234 280 266 237   < > = values in this interval not displayed.   Basic Metabolic Panel: Recent Labs  Lab 06/18/17 0925  06/18/17 0929 06/18/17 0934 06/19/17 0259 06/20/17 2005 06/22/17 0239 06/23/17 0434  NA  --    < > 131* 134* 132* 130* 130* 132*  K  --    < > 4.3 4.4 5.2* 5.2* 4.6 4.3  CL  --    < > 94* 93* 92* 90* 93* 95*  CO2  --   --  28  --  29 30 27 27   GLUCOSE  --    < > 112* 109* 165* 147* 205* 99  BUN  --    < > 15 16 25* 49* 33* 20  CREATININE  --    < > 5.68* 5.40* 6.94* 8.63* 5.99* 4.39*  CALCIUM  --   --  8.6*  --  8.9 8.7* 9.3 9.1  MG 1.6*  --   --   --   --   --   --   --  PHOS  --   --   --   --   --  3.1  --   --    < > = values in this interval not displayed.   Liver Function Tests: Recent Labs  Lab 06/18/17 0929 06/19/17 0259 06/20/17 2005  AST 24 24  --   ALT 11* 11*  --   ALKPHOS 113 110  --   BILITOT 0.7 1.1  --   PROT 5.4* 5.4*  --   ALBUMIN 2.3* 2.4*  2.6*   BNP (last 3 results) Recent Labs    08/29/16 2259 02/20/17 1706 03/24/17 2050  BNP >4,500.0* 3,193.8* 1,062.0*   Cardiac Enzymes: No results for input(s): CKTOTAL, CKMB, CKMBINDEX, TROPONINI in the last 168 hours. CBG: No results for input(s): GLUCAP in the last 168 hours. Hgb A1c No results for input(s): HGBA1C in the last 72 hours. Lipid Profile No results for input(s): CHOL, HDL, LDLCALC, TRIG, CHOLHDL, LDLDIRECT in the last 72 hours. Thyroid function studies No results for input(s): TSH, T4TOTAL, T3FREE, THYROIDAB in the last 72 hours.  Invalid input(s): FREET3 Anemia work up Recent Labs    06/20/17 2335  TIBC 318  IRON 115   Urinalysis    Component Value Date/Time   COLORURINE YELLOW 07/16/2011 1619   APPEARANCEUR CLEAR 07/16/2011 1619   LABSPEC 1.018 07/16/2011 1619   PHURINE 7.5 07/16/2011 1619   GLUCOSEU 250 (A) 07/16/2011 1619   HGBUR MODERATE (A) 07/16/2011 1619   BILIRUBINUR SMALL (A) 07/16/2011 1619   KETONESUR NEGATIVE 07/16/2011 1619   PROTEINUR >300 (A) 07/16/2011 1619   UROBILINOGEN 1.0 07/16/2011 1619   NITRITE NEGATIVE 07/16/2011 1619   LEUKOCYTESUR SMALL (A) 07/16/2011 1619      Time coordinating discharge: Over 30 minutes  SIGNED: Deatra James, MD, FACP, FHM. [facility] Pager [phone]  If 7PM-7AM, please contact night-coverage [email or web address] Password [email or web address] 06/23/2017, 2:12 PM

## 2017-06-23 NOTE — Progress Notes (Signed)
PT Cancellation Note  Patient Details Name: [name] MRN: [id number] DOB: 06-04-1961   Cancelled Treatment:    Reason Eval/Treat Not Completed: (P) Other (comment)(Pt finishing bathing and wished to eat lunch before PT will return after lunch.  )   Fruma [place] Eli Hose 06/23/2017, 12:16 PM  Governor Rooks, PTA pager [phone]

## 2017-06-23 NOTE — Care Management Note (Signed)
Case Management Note Original Note Created by: Marvetta Gibbons RN, BSN Unit 4E-Case Manager-- weekend coverage for [facility] [phone]  Patient Details  Name: [name] MRN: [id number] Date of Birth: 12/06/1960  Subjective/Objective:   Pt admitted with bleeding from left arm surgical site and also afib, COPD                 Action/Plan: PTA pt lived at home- just discharged from [facility] yesterday with [facility] services arranged with [facility]- (verified with [name] at [facility])- pt also had home wound VAC supplied by [facility]-(home wound VAC is in a bag in pt's room)- if pt is to return home with wound VAC- will need HH resumed and home wound VAC sent home for [facility] to reapply. - CM will follow for transition needs to return back home.   Expected Discharge Date:  06/23/17               Expected Discharge Plan:  [place]  In-House Referral:     Discharge planning Services  CM Consult  Post Acute Care Choice:  Home Health, Durable Medical Equipment Choice offered to:  Patient  DME Arranged:  3-N-1 DME Agency:  [place]:  PT, OT [facility] Agency:  [facility]  Status of Service:  Completed, signed off  If discussed at [place] of Stay Meetings, dates discussed:    Discharge Disposition:   Additional Comments: 06/23/2017 Pt to discharge home today - friend will drive pt home.  Pt is interested in [facility] PT and OT - no longer needs RN as wound vac is no longer in place.  Pt has PCP and denied barriers to obtaining medications as prescribed.  CM offered choice for [facility] - pt would like to remain with [facility] - agency contacted and new referral accepted - CM requested [place] orders from attending.  CM offered choice for DME - pt chose [facility]- agency contacted and referral accepted - CM requested DM orders from attending  06/21/17 Pt alert and oriented.  Per Attending pt will likely not discharge home with wound vac per surgery.  Pt does not  currently have on wound vac however his wound vac from home is in his room.  CM informed [facility] that pt will not likely require HH for wound vac management.  Pt independently ambulating around unit.  Pt states he will have family to pick him up at discharge - pt denied all CM needs.  [facility] also made aware of pt likely discharging without wound vac Maryclare Labrador, RN 06/23/2017, 2:40 PM

## 2017-06-23 NOTE — Progress Notes (Signed)
Discharged home accompanied by friend, discharge instructions given to pt. Belongings taken home

## 2017-06-24 DIAGNOSIS — D631 Anemia in chronic kidney disease: Secondary | ICD-10-CM | POA: Diagnosis not present

## 2017-06-24 DIAGNOSIS — N2581 Secondary hyperparathyroidism of renal origin: Secondary | ICD-10-CM | POA: Diagnosis not present

## 2017-06-24 DIAGNOSIS — N186 End stage renal disease: Secondary | ICD-10-CM | POA: Diagnosis not present

## 2017-06-25 DIAGNOSIS — J441 Chronic obstructive pulmonary disease with (acute) exacerbation: Secondary | ICD-10-CM | POA: Diagnosis not present

## 2017-06-25 DIAGNOSIS — N186 End stage renal disease: Secondary | ICD-10-CM | POA: Diagnosis not present

## 2017-06-25 DIAGNOSIS — I120 Hypertensive chronic kidney disease with stage 5 chronic kidney disease or end stage renal disease: Secondary | ICD-10-CM | POA: Diagnosis not present

## 2017-06-25 DIAGNOSIS — D631 Anemia in chronic kidney disease: Secondary | ICD-10-CM | POA: Diagnosis not present

## 2017-06-25 DIAGNOSIS — T82510D Breakdown (mechanical) of surgically created arteriovenous fistula, subsequent encounter: Secondary | ICD-10-CM | POA: Diagnosis not present

## 2017-06-25 DIAGNOSIS — Z992 Dependence on renal dialysis: Secondary | ICD-10-CM | POA: Diagnosis not present

## 2017-06-27 DIAGNOSIS — N186 End stage renal disease: Secondary | ICD-10-CM | POA: Diagnosis not present

## 2017-06-27 DIAGNOSIS — N2581 Secondary hyperparathyroidism of renal origin: Secondary | ICD-10-CM | POA: Diagnosis not present

## 2017-06-27 DIAGNOSIS — G8929 Other chronic pain: Secondary | ICD-10-CM | POA: Diagnosis not present

## 2017-06-27 DIAGNOSIS — M79602 Pain in left arm: Secondary | ICD-10-CM | POA: Diagnosis not present

## 2017-06-27 DIAGNOSIS — D631 Anemia in chronic kidney disease: Secondary | ICD-10-CM | POA: Diagnosis not present

## 2017-06-28 DIAGNOSIS — D631 Anemia in chronic kidney disease: Secondary | ICD-10-CM | POA: Diagnosis not present

## 2017-06-28 DIAGNOSIS — N186 End stage renal disease: Secondary | ICD-10-CM | POA: Diagnosis not present

## 2017-06-28 DIAGNOSIS — I120 Hypertensive chronic kidney disease with stage 5 chronic kidney disease or end stage renal disease: Secondary | ICD-10-CM | POA: Diagnosis not present

## 2017-06-28 DIAGNOSIS — Z992 Dependence on renal dialysis: Secondary | ICD-10-CM | POA: Diagnosis not present

## 2017-06-28 DIAGNOSIS — J441 Chronic obstructive pulmonary disease with (acute) exacerbation: Secondary | ICD-10-CM | POA: Diagnosis not present

## 2017-06-28 DIAGNOSIS — T82510D Breakdown (mechanical) of surgically created arteriovenous fistula, subsequent encounter: Secondary | ICD-10-CM | POA: Diagnosis not present

## 2017-06-29 DIAGNOSIS — N2581 Secondary hyperparathyroidism of renal origin: Secondary | ICD-10-CM | POA: Diagnosis not present

## 2017-06-29 DIAGNOSIS — N186 End stage renal disease: Secondary | ICD-10-CM | POA: Diagnosis not present

## 2017-06-29 DIAGNOSIS — D631 Anemia in chronic kidney disease: Secondary | ICD-10-CM | POA: Diagnosis not present

## 2017-06-29 DIAGNOSIS — J441 Chronic obstructive pulmonary disease with (acute) exacerbation: Secondary | ICD-10-CM | POA: Diagnosis not present

## 2017-06-29 DIAGNOSIS — Z992 Dependence on renal dialysis: Secondary | ICD-10-CM | POA: Diagnosis not present

## 2017-06-29 DIAGNOSIS — T82510D Breakdown (mechanical) of surgically created arteriovenous fistula, subsequent encounter: Secondary | ICD-10-CM | POA: Diagnosis not present

## 2017-06-29 DIAGNOSIS — I120 Hypertensive chronic kidney disease with stage 5 chronic kidney disease or end stage renal disease: Secondary | ICD-10-CM | POA: Diagnosis not present

## 2017-06-30 ENCOUNTER — Encounter: Payer: Self-pay | Admitting: Vascular Surgery

## 2017-06-30 ENCOUNTER — Encounter (HOSPITAL_COMMUNITY): Payer: Self-pay | Admitting: *Deleted

## 2017-06-30 ENCOUNTER — Other Ambulatory Visit: Payer: Self-pay

## 2017-06-30 ENCOUNTER — Ambulatory Visit (INDEPENDENT_AMBULATORY_CARE_PROVIDER_SITE_OTHER): Payer: Self-pay | Admitting: Vascular Surgery

## 2017-06-30 ENCOUNTER — Other Ambulatory Visit: Payer: Self-pay | Admitting: *Deleted

## 2017-06-30 VITALS — BP 174/81 | HR 87 | Temp 98.0°F | Resp 16 | Ht 69.0 in | Wt 165.0 lb

## 2017-06-30 DIAGNOSIS — Z720 Tobacco use: Secondary | ICD-10-CM | POA: Diagnosis not present

## 2017-06-30 DIAGNOSIS — B1910 Unspecified viral hepatitis B without hepatic coma: Secondary | ICD-10-CM | POA: Diagnosis not present

## 2017-06-30 DIAGNOSIS — F419 Anxiety disorder, unspecified: Secondary | ICD-10-CM | POA: Diagnosis not present

## 2017-06-30 DIAGNOSIS — D638 Anemia in other chronic diseases classified elsewhere: Secondary | ICD-10-CM | POA: Diagnosis not present

## 2017-06-30 DIAGNOSIS — Z87898 Personal history of other specified conditions: Secondary | ICD-10-CM | POA: Diagnosis not present

## 2017-06-30 DIAGNOSIS — B182 Chronic viral hepatitis C: Secondary | ICD-10-CM | POA: Diagnosis not present

## 2017-06-30 DIAGNOSIS — I480 Paroxysmal atrial fibrillation: Secondary | ICD-10-CM | POA: Diagnosis not present

## 2017-06-30 DIAGNOSIS — G8929 Other chronic pain: Secondary | ICD-10-CM | POA: Diagnosis not present

## 2017-06-30 DIAGNOSIS — Z992 Dependence on renal dialysis: Secondary | ICD-10-CM

## 2017-06-30 DIAGNOSIS — I10 Essential (primary) hypertension: Secondary | ICD-10-CM | POA: Diagnosis not present

## 2017-06-30 DIAGNOSIS — I429 Cardiomyopathy, unspecified: Secondary | ICD-10-CM | POA: Diagnosis not present

## 2017-06-30 DIAGNOSIS — N186 End stage renal disease: Secondary | ICD-10-CM | POA: Diagnosis not present

## 2017-06-30 NOTE — Progress Notes (Signed)
Spoke with pt for pre-op call. Pt was not wanting to answer all the questions. He stated that all he has wrong with him is he had a heart attack this year, has a stent, has COPD and is on dialysis. Pt denies any recent chest pain or sob. He was put on Plavix after his heart attack, but he states his kidney doctor took him off of it since he gets a blood thinner during dialysis. Chart forwarded to anesthesiology PA/NP.

## 2017-06-30 NOTE — H&P (View-Only) (Signed)
[name] who returns for follow-up today.  He had recent ligation of a left arm AV fistula for bleeding and has a large wound on his left arm after his skin necrosed.  He had had some problems with bleeding and thrombocytopenia but this seems to have resolved.  He is currently doing hydrogel dressings once daily.  Past Medical History:  Diagnosis Date  . Adenomatous colon polyp    tubular  . Anemia   . Anxiety   . Arthritis    left shoulder  . Atherosclerosis of aorta ([place])   . Cardiomegaly   . Chest pain    DATE UNKNOWN, C/O PERIODICALLY  . Chronic kidney disease    dialysis - M/W/F  . Cocaine abuse ([place])   . COPD exacerbation ([place]) 08/17/2016  . Dialysis patient ([place])    Monday-Wednesday-Friday  . GERD (gastroesophageal reflux disease)    DATE UNKNOWN  . Hemorrhoids   . Hepatitis B, chronic ([place])   . Hepatitis C   . Hyperkalemia   . Hypertension   . Metabolic bone disease    Patient denies  . Nephrolithiasis   . Pneumonia   . Pulmonary edema   . Renal disorder   . Renal insufficiency   . Shortness of breath dyspnea    " for the last past year with this dialysis"  . Tubular adenoma of colon     Past Surgical History:  Procedure Laterality Date  . A/V FISTULAGRAM Left 05/26/2017   Procedure: A/V FISTULAGRAM;  Surgeon: Conrad Shelbina, MD;  Location: [place] [facility];  Service: Cardiovascular;  Laterality: Left;  . APPLICATION OF WOUND VAC Left 06/14/2017   Procedure: APPLICATION OF WOUND VAC;  Surgeon: Katha Cabal, MD;  Location: [facility];  Service: Vascular;  Laterality: Left;  . AV FISTULA PLACEMENT  2012   BELIEVED WAS PLACED IN JUNE  . COLONOSCOPY    . CORONARY STENT INTERVENTION N/A 02/22/2017   Procedure: CORONARY STENT INTERVENTION;  Surgeon: Nigel Mormon, MD;  Location: [place] [facility];  Service: Cardiovascular;  Laterality: N/A;  . HEMORRHOID BANDING    . I&D EXTREMITY Left 06/01/2017   Procedure: IRRIGATION AND  DEBRIDEMENT LEFT ARM HEMATOMA WITH LIGATION OF LEFT ARM AV FISTULA;  Surgeon: Elam Dutch, MD;  Location: [place];  Service: Vascular;  Laterality: Left;  . I&D EXTREMITY Left 06/14/2017   Procedure: IRRIGATION AND DEBRIDEMENT EXTREMITY;  Surgeon: Katha Cabal, MD;  Location: [facility];  Service: Vascular;  Laterality: Left;  . INSERTION OF DIALYSIS CATHETER  05/30/2017  . INSERTION OF DIALYSIS CATHETER N/A 05/30/2017   Procedure: INSERTION OF DIALYSIS CATHETER;  Surgeon: Elam Dutch, MD;  Location: [facility];  Service: Vascular;  Laterality: N/A;  . LEFT HEART CATH AND CORONARY ANGIOGRAPHY N/A 02/22/2017   Procedure: LEFT HEART CATH AND CORONARY ANGIOGRAPHY;  Surgeon: Nigel Mormon, MD;  Location: [place] [facility];  Service: Cardiovascular;  Laterality: N/A;  . LIGATION OF ARTERIOVENOUS  FISTULA Left 12/12/1306   Procedure: Plication of Left Arm Arteriovenous Fistula;  Surgeon: Elam Dutch, MD;  Location: [place];  Service: Vascular;  Laterality: Left;  . POLYPECTOMY    . REVISON OF ARTERIOVENOUS FISTULA Left 6/57/8469   Procedure: PLICATION OF DISTAL ANEURYSMAL SEGEMENT OF LEFT UPPER ARM ARTERIOVENOUS FISTULA;  Surgeon: Elam Dutch, MD;  Location: [place];  Service: Vascular;  Laterality: Left;  . REVISON OF ARTERIOVENOUS FISTULA Left 01/08/5283   Procedure: Plication of  Left Upper Arm Fistula ;  Surgeon: Waynetta Sandy, MD;  Location: [place];  Service: Vascular;  Laterality: Left;  . THROMBECTOMY W/ EMBOLECTOMY Left 06/05/2017   Procedure: EXPLORATION OF LEFT ARM FOR BLEEDING; OVERSEWED PROXIMAL FISTULA;  Surgeon: Angelia Mould, MD;  Location: [place];  Service: Vascular;  Laterality: Left;  . WOUND EXPLORATION Left 06/03/2017   Procedure: WOUND EXPLORATION WITH WOUND VAC APPLICATION TO LEFT ARM;  Surgeon: Angelia Mould, MD;  Location: [facility] OR;  Service: Vascular;  Laterality: Left;    Current Outpatient Medications on File Prior to Visit   Medication Sig Dispense Refill  . acetaminophen (TYLENOL) 325 MG tablet Take 2 tablets (650 mg total) by mouth every 6 (six) hours as needed for mild pain (or Fever >/= 101). 30 tablet 0  . budesonide-formoterol (SYMBICORT) 160-4.5 MCG/ACT inhaler Inhale 2 puffs into the lungs every 12 (twelve) hours.    . calcitRIOL (ROCALTROL) 0.25 MCG capsule Take 3 capsules (0.75 mcg total) by mouth every Monday, Wednesday, and Friday with hemodialysis.    Marland Kitchen diltiazem (CARDIZEM CD) 120 MG 24 hr capsule Take 1 capsule (120 mg total) by mouth daily. 30 capsule 1  . gabapentin (NEURONTIN) 100 MG capsule Take 100 mg 2 (two) times daily by mouth.    . isosorbide mononitrate (IMDUR) 30 MG 24 hr tablet Take 1 tablet (30 mg total) by mouth daily. 30 tablet 1  . minoxidil (LONITEN) 2.5 MG tablet Take 1 tablet (2.5 mg total) by mouth 2 (two) times daily. 60 tablet 0  . multivitamin (RENA-VIT) TABS tablet Take 1 tablet by mouth at bedtime. 30 each 0  . nicotine (NICODERM CQ - DOSED IN MG/24 HOURS) 14 mg/24hr patch Place 14 mg daily onto the skin.  0  . senna (SENOKOT) 8.6 MG TABS tablet Take 1 tablet (8.6 mg total) by mouth daily as needed for mild constipation.  0  . sevelamer carbonate (RENVELA) 800 MG tablet Take 4 tablets (3,200 mg total) by mouth 3 (three) times daily with meals.    Marland Kitchen Spacer/Aero-Holding Chambers (AEROCHAMBER MV) inhaler Use as instructed 1 each 2  . vitamin C (VITAMIN C) 500 MG tablet Take 1 tablet (500 mg total) by mouth 2 (two) times daily. 60 tablet 0  . zolpidem (AMBIEN) 10 MG tablet Take 1 tablet (10 mg total) by mouth at bedtime as needed for sleep.     No current facility-administered medications on file prior to visit.     Physical exam:  Vitals:   06/30/17 1237  BP: (!) 174/81  Pulse: 87  Resp: 16  Temp: 98 F (36.7 C)  TempSrc: Oral  SpO2: 95%  Weight: 165 lb (74.8 kg)  Height: 5\' 9"  (1.753 m)    Left upper extremity: Pink granulation tissue healthy-appearing muscle left  medial upper arm  Assessment: Patient with large open wound left arm.  I believe he needs skin grafting for good skin coverage to make sure all his vessels are covered and also assist with improving wound healing.  Plan: The patient will be scheduled for split-thickness skin graft of the left upper extremity.  Donor site will be his anterior thigh.  Risk benefits possible complications of procedure details were discussed with the patient today including but not limited to bleeding infection failure of the skin graft.  He understands and agrees to proceed.  This is scheduled for July 04, 2017.  Ruta Hinds, MD [facility] Office: [phone] Pager: [phone]

## 2017-06-30 NOTE — Progress Notes (Signed)
[name] who returns for follow-up today.  He had recent ligation of a left arm AV fistula for bleeding and has a large wound on his left arm after his skin necrosed.  He had had some problems with bleeding and thrombocytopenia but this seems to have resolved.  He is currently doing hydrogel dressings once daily.  Past Medical History:  Diagnosis Date  . Adenomatous colon polyp    tubular  . Anemia   . Anxiety   . Arthritis    left shoulder  . Atherosclerosis of aorta ([place])   . Cardiomegaly   . Chest pain    DATE UNKNOWN, C/O PERIODICALLY  . Chronic kidney disease    dialysis - M/W/F  . Cocaine abuse ([place])   . COPD exacerbation ([place]) 08/17/2016  . Dialysis patient ([place])    Monday-Wednesday-Friday  . GERD (gastroesophageal reflux disease)    DATE UNKNOWN  . Hemorrhoids   . Hepatitis B, chronic ([place])   . Hepatitis C   . Hyperkalemia   . Hypertension   . Metabolic bone disease    Patient denies  . Nephrolithiasis   . Pneumonia   . Pulmonary edema   . Renal disorder   . Renal insufficiency   . Shortness of breath dyspnea    " for the last past year with this dialysis"  . Tubular adenoma of colon     Past Surgical History:  Procedure Laterality Date  . A/V FISTULAGRAM Left 05/26/2017   Procedure: A/V FISTULAGRAM;  Surgeon: Conrad St. Francis, MD;  Location: [place] [facility];  Service: Cardiovascular;  Laterality: Left;  . APPLICATION OF WOUND VAC Left 06/14/2017   Procedure: APPLICATION OF WOUND VAC;  Surgeon: Katha Cabal, MD;  Location: [facility];  Service: Vascular;  Laterality: Left;  . AV FISTULA PLACEMENT  2012   BELIEVED WAS PLACED IN JUNE  . COLONOSCOPY    . CORONARY STENT INTERVENTION N/A 02/22/2017   Procedure: CORONARY STENT INTERVENTION;  Surgeon: Nigel Mormon, MD;  Location: [place] [facility];  Service: Cardiovascular;  Laterality: N/A;  . HEMORRHOID BANDING    . I&D EXTREMITY Left 06/01/2017   Procedure: IRRIGATION AND  DEBRIDEMENT LEFT ARM HEMATOMA WITH LIGATION OF LEFT ARM AV FISTULA;  Surgeon: Elam Dutch, MD;  Location: [place];  Service: Vascular;  Laterality: Left;  . I&D EXTREMITY Left 06/14/2017   Procedure: IRRIGATION AND DEBRIDEMENT EXTREMITY;  Surgeon: Katha Cabal, MD;  Location: [facility];  Service: Vascular;  Laterality: Left;  . INSERTION OF DIALYSIS CATHETER  05/30/2017  . INSERTION OF DIALYSIS CATHETER N/A 05/30/2017   Procedure: INSERTION OF DIALYSIS CATHETER;  Surgeon: Elam Dutch, MD;  Location: [facility] OR;  Service: Vascular;  Laterality: N/A;  . LEFT HEART CATH AND CORONARY ANGIOGRAPHY N/A 02/22/2017   Procedure: LEFT HEART CATH AND CORONARY ANGIOGRAPHY;  Surgeon: Nigel Mormon, MD;  Location: [place] [facility];  Service: Cardiovascular;  Laterality: N/A;  . LIGATION OF ARTERIOVENOUS  FISTULA Left 01/09/2457   Procedure: Plication of Left Arm Arteriovenous Fistula;  Surgeon: Elam Dutch, MD;  Location: [place];  Service: Vascular;  Laterality: Left;  . POLYPECTOMY    . REVISON OF ARTERIOVENOUS FISTULA Left 0/99/8338   Procedure: PLICATION OF DISTAL ANEURYSMAL SEGEMENT OF LEFT UPPER ARM ARTERIOVENOUS FISTULA;  Surgeon: Elam Dutch, MD;  Location: [place];  Service: Vascular;  Laterality: Left;  . REVISON OF ARTERIOVENOUS FISTULA Left 2/50/5397   Procedure: Plication of  Left Upper Arm Fistula ;  Surgeon: Waynetta Sandy, MD;  Location: [place];  Service: Vascular;  Laterality: Left;  . THROMBECTOMY W/ EMBOLECTOMY Left 06/05/2017   Procedure: EXPLORATION OF LEFT ARM FOR BLEEDING; OVERSEWED PROXIMAL FISTULA;  Surgeon: Angelia Mould, MD;  Location: [place];  Service: Vascular;  Laterality: Left;  . WOUND EXPLORATION Left 06/03/2017   Procedure: WOUND EXPLORATION WITH WOUND VAC APPLICATION TO LEFT ARM;  Surgeon: Angelia Mould, MD;  Location: [facility] OR;  Service: Vascular;  Laterality: Left;    Current Outpatient Medications on File Prior to Visit   Medication Sig Dispense Refill  . acetaminophen (TYLENOL) 325 MG tablet Take 2 tablets (650 mg total) by mouth every 6 (six) hours as needed for mild pain (or Fever >/= 101). 30 tablet 0  . budesonide-formoterol (SYMBICORT) 160-4.5 MCG/ACT inhaler Inhale 2 puffs into the lungs every 12 (twelve) hours.    . calcitRIOL (ROCALTROL) 0.25 MCG capsule Take 3 capsules (0.75 mcg total) by mouth every Monday, Wednesday, and Friday with hemodialysis.    Marland Kitchen diltiazem (CARDIZEM CD) 120 MG 24 hr capsule Take 1 capsule (120 mg total) by mouth daily. 30 capsule 1  . gabapentin (NEURONTIN) 100 MG capsule Take 100 mg 2 (two) times daily by mouth.    . isosorbide mononitrate (IMDUR) 30 MG 24 hr tablet Take 1 tablet (30 mg total) by mouth daily. 30 tablet 1  . minoxidil (LONITEN) 2.5 MG tablet Take 1 tablet (2.5 mg total) by mouth 2 (two) times daily. 60 tablet 0  . multivitamin (RENA-VIT) TABS tablet Take 1 tablet by mouth at bedtime. 30 each 0  . nicotine (NICODERM CQ - DOSED IN MG/24 HOURS) 14 mg/24hr patch Place 14 mg daily onto the skin.  0  . senna (SENOKOT) 8.6 MG TABS tablet Take 1 tablet (8.6 mg total) by mouth daily as needed for mild constipation.  0  . sevelamer carbonate (RENVELA) 800 MG tablet Take 4 tablets (3,200 mg total) by mouth 3 (three) times daily with meals.    Marland Kitchen Spacer/Aero-Holding Chambers (AEROCHAMBER MV) inhaler Use as instructed 1 each 2  . vitamin C (VITAMIN C) 500 MG tablet Take 1 tablet (500 mg total) by mouth 2 (two) times daily. 60 tablet 0  . zolpidem (AMBIEN) 10 MG tablet Take 1 tablet (10 mg total) by mouth at bedtime as needed for sleep.     No current facility-administered medications on file prior to visit.     Physical exam:  Vitals:   06/30/17 1237  BP: (!) 174/81  Pulse: 87  Resp: 16  Temp: 98 F (36.7 C)  TempSrc: Oral  SpO2: 95%  Weight: 165 lb (74.8 kg)  Height: 5\' 9"  (1.753 m)    Left upper extremity: Pink granulation tissue healthy-appearing muscle left  medial upper arm  Assessment: Patient with large open wound left arm.  I believe he needs skin grafting for good skin coverage to make sure all his vessels are covered and also assist with improving wound healing.  Plan: The patient will be scheduled for split-thickness skin graft of the left upper extremity.  Donor site will be his anterior thigh.  Risk benefits possible complications of procedure details were discussed with the patient today including but not limited to bleeding infection failure of the skin graft.  He understands and agrees to proceed.  This is scheduled for July 04, 2017.  Ruta Hinds, MD [facility] [place] Office: [phone] Pager: [phone]

## 2017-07-01 DIAGNOSIS — Z992 Dependence on renal dialysis: Secondary | ICD-10-CM | POA: Diagnosis not present

## 2017-07-01 DIAGNOSIS — N2581 Secondary hyperparathyroidism of renal origin: Secondary | ICD-10-CM | POA: Diagnosis not present

## 2017-07-01 DIAGNOSIS — D631 Anemia in chronic kidney disease: Secondary | ICD-10-CM | POA: Diagnosis not present

## 2017-07-01 DIAGNOSIS — N186 End stage renal disease: Secondary | ICD-10-CM | POA: Diagnosis not present

## 2017-07-01 DIAGNOSIS — T82510D Breakdown (mechanical) of surgically created arteriovenous fistula, subsequent encounter: Secondary | ICD-10-CM | POA: Diagnosis not present

## 2017-07-01 DIAGNOSIS — I120 Hypertensive chronic kidney disease with stage 5 chronic kidney disease or end stage renal disease: Secondary | ICD-10-CM | POA: Diagnosis not present

## 2017-07-01 DIAGNOSIS — J441 Chronic obstructive pulmonary disease with (acute) exacerbation: Secondary | ICD-10-CM | POA: Diagnosis not present

## 2017-07-01 NOTE — Progress Notes (Addendum)
Anesthesia Chart Review:  Pt is a same day work up  Pt is a 56 year old male scheduled for skin graft split thickness L arm, donor site anterior L thigh on 07/04/2017 with Ruta Hinds, MD  - PCP is [name], MD - Cardiology care at [facility]. Pt scheduled for appointment 07/01/17   PMH includes:  CAD (s/p DES to RCA 02/22/17), HTN, pulmonary hypertension, atrial fibrillation/flutter (no anticoagulation due to compliance issues), chronic hepatitis B, hepatitis C, anemia, ESRD on hemodialysis, COPD, hx cocaine abuse. Current smoker. BMI 24  - Hospitalized 12/8-13/18 for atrial flutter with RVR, COPD exacerbation, bleeding from L arm AV dialysis access, anemia of chronic disease (s/p transfusion x2)  -  Hospitalized 12/2-7/18 for LUE infection (s/p I&D, wound vac), chronic hypoxic respiratory failure   - Hospitalized 11/18-30/18 for acute pulmonary edema due to volume overload, LUE AV fistula hematoma (s/p exploration 11/23, ligation of fistula 94/50; complicated by acute blood loss anemia requiring 11 units PRBCs). Also expressed SI, transferred to behavioral health 11/30-12/2/18  - Hospitalized 10/12-15/18 for acute respiratory failure with hypoxia likely due to acute pulmonary edema (from missing dialysis), acute metabolic encephalopathy, ascites   - Hospitalized 9/13-15/18 for acute hypoxic respiratory distress due to pulmonary edema, PAF with RVR (s/p cardioversion). Discharged at patient's insistence.    - Hospitalized 8/12-15/18 for SOB, unstable angina (s/p DES to RCA), afib with RVR, heart failure  Medications include: symbicort, diltiazem, imdur, minoxidil  Labs will be obtained day of surgery.   1 view CXR 06/18/17:  - Stable cardiomegaly with left basilar atelectasis versus developing infiltrate.  EKG 06/18/17:  - Sinus rhythm. Borderline prolonged PR interval. Nonspecific IVCD with LAD. LVH. Abnormal T, consider ischemia, lateral leads  Echo  06/01/17:  - Left ventricle: The cavity size was normal. Wall thickness was increased in a pattern of moderate LVH. The study is not technically sufficient to allow evaluation of LV diastolic function. - Mitral valve: Calcified annulus and basal leaflets severe MS based on gradients. Moderately calcified annulus. Moderately thickened leaflets . There was mild regurgitation. - Left atrium: The atrium was moderately dilated. - Right ventricle: The cavity size was moderately dilated. - Right atrium: The atrium was moderately dilated. - Atrial septum: No defect or patent foramen ovale was identified. - Tricuspid valve: There was severe regurgitation. - Pulmonary arteries: PA peak pressure: 95 mm Hg (S). - Pericardium, extracardiac: Small posterior pericardial effusion  Cardiac cath 02/22/17:  - Severe single vessel coronary artery disease with high grade prox RCA stenosis - Successful PTCA and stent placement Xience [place] 3.5 X 23 mm - 0% residual stenosis. TIMI III flow. - Elevated right atrial pressures - Severe pulmonary hypertension, likely WHO Grp II - Elevated LVEDP  Pt will need further assessment by assigned anesthesiologist day of surgery. If no acute CV or respiratory symptoms, and labs acceptable, I anticipate pt can proceed with surgery as scheduled.   Willeen [place], FNP-BC [facility]/Anesthesiology Phone: [phone] 07/01/2017 3:09 PM  Addendum: Called to follow-up on today's [facility] office visit, but staff said patient had rescheduled appointment, so was not seen today.  George Hugh [facility] : [facility] Short Stay Center/Anesthesiology Phone [phone] 07/01/2017 4:18 PM

## 2017-07-03 DIAGNOSIS — N186 End stage renal disease: Secondary | ICD-10-CM | POA: Diagnosis not present

## 2017-07-03 DIAGNOSIS — N2581 Secondary hyperparathyroidism of renal origin: Secondary | ICD-10-CM | POA: Diagnosis not present

## 2017-07-03 DIAGNOSIS — D631 Anemia in chronic kidney disease: Secondary | ICD-10-CM | POA: Diagnosis not present

## 2017-07-04 ENCOUNTER — Ambulatory Visit (HOSPITAL_COMMUNITY): Payer: Medicare Other | Admitting: Emergency Medicine

## 2017-07-04 ENCOUNTER — Inpatient Hospital Stay (HOSPITAL_COMMUNITY)
Admission: RE | Admit: 2017-07-04 | Discharge: 2017-07-18 | DRG: 264 | Discharge status: Against Medical Advice | Payer: Medicare Other | Source: Ambulatory Visit | Attending: Internal Medicine | Admitting: Internal Medicine

## 2017-07-04 ENCOUNTER — Other Ambulatory Visit: Payer: Self-pay

## 2017-07-04 ENCOUNTER — Encounter (HOSPITAL_COMMUNITY)
Admission: RE | Discharge status: Against Medical Advice | Payer: Self-pay | Source: Ambulatory Visit | Attending: Vascular Surgery

## 2017-07-04 ENCOUNTER — Encounter (HOSPITAL_COMMUNITY): Payer: Self-pay | Admitting: *Deleted

## 2017-07-04 DIAGNOSIS — I96 Gangrene, not elsewhere classified: Secondary | ICD-10-CM | POA: Diagnosis present

## 2017-07-04 DIAGNOSIS — R0989 Other specified symptoms and signs involving the circulatory and respiratory systems: Secondary | ICD-10-CM | POA: Diagnosis not present

## 2017-07-04 DIAGNOSIS — R739 Hyperglycemia, unspecified: Secondary | ICD-10-CM | POA: Diagnosis not present

## 2017-07-04 DIAGNOSIS — J9601 Acute respiratory failure with hypoxia: Secondary | ICD-10-CM | POA: Diagnosis not present

## 2017-07-04 DIAGNOSIS — J189 Pneumonia, unspecified organism: Secondary | ICD-10-CM | POA: Diagnosis not present

## 2017-07-04 DIAGNOSIS — Z978 Presence of other specified devices: Secondary | ICD-10-CM

## 2017-07-04 DIAGNOSIS — J440 Chronic obstructive pulmonary disease with acute lower respiratory infection: Secondary | ICD-10-CM | POA: Diagnosis not present

## 2017-07-04 DIAGNOSIS — Z79899 Other long term (current) drug therapy: Secondary | ICD-10-CM

## 2017-07-04 DIAGNOSIS — F17210 Nicotine dependence, cigarettes, uncomplicated: Secondary | ICD-10-CM | POA: Diagnosis present

## 2017-07-04 DIAGNOSIS — K644 Residual hemorrhoidal skin tags: Secondary | ICD-10-CM | POA: Diagnosis present

## 2017-07-04 DIAGNOSIS — J42 Unspecified chronic bronchitis: Secondary | ICD-10-CM | POA: Diagnosis not present

## 2017-07-04 DIAGNOSIS — Z7951 Long term (current) use of inhaled steroids: Secondary | ICD-10-CM

## 2017-07-04 DIAGNOSIS — Z888 Allergy status to other drugs, medicaments and biological substances status: Secondary | ICD-10-CM

## 2017-07-04 DIAGNOSIS — J449 Chronic obstructive pulmonary disease, unspecified: Secondary | ICD-10-CM | POA: Diagnosis present

## 2017-07-04 DIAGNOSIS — Z4682 Encounter for fitting and adjustment of non-vascular catheter: Secondary | ICD-10-CM | POA: Diagnosis not present

## 2017-07-04 DIAGNOSIS — D696 Thrombocytopenia, unspecified: Secondary | ICD-10-CM | POA: Diagnosis not present

## 2017-07-04 DIAGNOSIS — Y832 Surgical operation with anastomosis, bypass or graft as the cause of abnormal reaction of the patient, or of later complication, without mention of misadventure at the time of the procedure: Secondary | ICD-10-CM | POA: Diagnosis present

## 2017-07-04 DIAGNOSIS — I700 Atherosclerosis of aorta: Secondary | ICD-10-CM | POA: Diagnosis present

## 2017-07-04 DIAGNOSIS — K921 Melena: Secondary | ICD-10-CM | POA: Diagnosis present

## 2017-07-04 DIAGNOSIS — R188 Other ascites: Secondary | ICD-10-CM | POA: Diagnosis present

## 2017-07-04 DIAGNOSIS — B974 Respiratory syncytial virus as the cause of diseases classified elsewhere: Secondary | ICD-10-CM

## 2017-07-04 DIAGNOSIS — F1410 Cocaine abuse, uncomplicated: Secondary | ICD-10-CM | POA: Diagnosis not present

## 2017-07-04 DIAGNOSIS — R578 Other shock: Secondary | ICD-10-CM | POA: Diagnosis not present

## 2017-07-04 DIAGNOSIS — I120 Hypertensive chronic kidney disease with stage 5 chronic kidney disease or end stage renal disease: Secondary | ICD-10-CM | POA: Diagnosis present

## 2017-07-04 DIAGNOSIS — I472 Ventricular tachycardia: Secondary | ICD-10-CM | POA: Diagnosis not present

## 2017-07-04 DIAGNOSIS — T82898A Other specified complication of vascular prosthetic devices, implants and grafts, initial encounter: Principal | ICD-10-CM | POA: Diagnosis present

## 2017-07-04 DIAGNOSIS — D631 Anemia in chronic kidney disease: Secondary | ICD-10-CM | POA: Diagnosis not present

## 2017-07-04 DIAGNOSIS — Z955 Presence of coronary angioplasty implant and graft: Secondary | ICD-10-CM

## 2017-07-04 DIAGNOSIS — B181 Chronic viral hepatitis B without delta-agent: Secondary | ICD-10-CM | POA: Diagnosis present

## 2017-07-04 DIAGNOSIS — Z885 Allergy status to narcotic agent status: Secondary | ICD-10-CM

## 2017-07-04 DIAGNOSIS — R1030 Lower abdominal pain, unspecified: Secondary | ICD-10-CM | POA: Diagnosis not present

## 2017-07-04 DIAGNOSIS — B182 Chronic viral hepatitis C: Secondary | ICD-10-CM | POA: Diagnosis present

## 2017-07-04 DIAGNOSIS — I2510 Atherosclerotic heart disease of native coronary artery without angina pectoris: Secondary | ICD-10-CM | POA: Diagnosis present

## 2017-07-04 DIAGNOSIS — R451 Restlessness and agitation: Secondary | ICD-10-CM | POA: Diagnosis not present

## 2017-07-04 DIAGNOSIS — R0603 Acute respiratory distress: Secondary | ICD-10-CM | POA: Diagnosis not present

## 2017-07-04 DIAGNOSIS — I313 Pericardial effusion (noninflammatory): Secondary | ICD-10-CM | POA: Diagnosis present

## 2017-07-04 DIAGNOSIS — I4891 Unspecified atrial fibrillation: Secondary | ICD-10-CM | POA: Diagnosis not present

## 2017-07-04 DIAGNOSIS — I050 Rheumatic mitral stenosis: Secondary | ICD-10-CM | POA: Diagnosis not present

## 2017-07-04 DIAGNOSIS — N189 Chronic kidney disease, unspecified: Secondary | ICD-10-CM | POA: Diagnosis present

## 2017-07-04 DIAGNOSIS — I4892 Unspecified atrial flutter: Secondary | ICD-10-CM | POA: Diagnosis not present

## 2017-07-04 DIAGNOSIS — J9690 Respiratory failure, unspecified, unspecified whether with hypoxia or hypercapnia: Secondary | ICD-10-CM | POA: Diagnosis not present

## 2017-07-04 DIAGNOSIS — J441 Chronic obstructive pulmonary disease with (acute) exacerbation: Secondary | ICD-10-CM | POA: Diagnosis not present

## 2017-07-04 DIAGNOSIS — F419 Anxiety disorder, unspecified: Secondary | ICD-10-CM | POA: Diagnosis present

## 2017-07-04 DIAGNOSIS — J439 Emphysema, unspecified: Secondary | ICD-10-CM | POA: Diagnosis not present

## 2017-07-04 DIAGNOSIS — R0600 Dyspnea, unspecified: Secondary | ICD-10-CM | POA: Diagnosis not present

## 2017-07-04 DIAGNOSIS — D509 Iron deficiency anemia, unspecified: Secondary | ICD-10-CM | POA: Diagnosis present

## 2017-07-04 DIAGNOSIS — R748 Abnormal levels of other serum enzymes: Secondary | ICD-10-CM | POA: Diagnosis not present

## 2017-07-04 DIAGNOSIS — G9341 Metabolic encephalopathy: Secondary | ICD-10-CM | POA: Diagnosis not present

## 2017-07-04 DIAGNOSIS — K219 Gastro-esophageal reflux disease without esophagitis: Secondary | ICD-10-CM | POA: Diagnosis present

## 2017-07-04 DIAGNOSIS — R69 Illness, unspecified: Secondary | ICD-10-CM

## 2017-07-04 DIAGNOSIS — B338 Other specified viral diseases: Secondary | ICD-10-CM

## 2017-07-04 DIAGNOSIS — N2581 Secondary hyperparathyroidism of renal origin: Secondary | ICD-10-CM | POA: Diagnosis present

## 2017-07-04 DIAGNOSIS — J121 Respiratory syncytial virus pneumonia: Secondary | ICD-10-CM | POA: Diagnosis not present

## 2017-07-04 DIAGNOSIS — J9600 Acute respiratory failure, unspecified whether with hypoxia or hypercapnia: Secondary | ICD-10-CM

## 2017-07-04 DIAGNOSIS — J129 Viral pneumonia, unspecified: Secondary | ICD-10-CM | POA: Diagnosis not present

## 2017-07-04 DIAGNOSIS — N250 Renal osteodystrophy: Secondary | ICD-10-CM | POA: Diagnosis present

## 2017-07-04 DIAGNOSIS — Z7902 Long term (current) use of antithrombotics/antiplatelets: Secondary | ICD-10-CM

## 2017-07-04 DIAGNOSIS — Z9119 Patient's noncompliance with other medical treatment and regimen: Secondary | ICD-10-CM

## 2017-07-04 DIAGNOSIS — D62 Acute posthemorrhagic anemia: Secondary | ICD-10-CM | POA: Diagnosis not present

## 2017-07-04 DIAGNOSIS — I739 Peripheral vascular disease, unspecified: Secondary | ICD-10-CM | POA: Diagnosis present

## 2017-07-04 DIAGNOSIS — K626 Ulcer of anus and rectum: Secondary | ICD-10-CM | POA: Diagnosis present

## 2017-07-04 DIAGNOSIS — Z9911 Dependence on respirator [ventilator] status: Secondary | ICD-10-CM | POA: Diagnosis not present

## 2017-07-04 DIAGNOSIS — N186 End stage renal disease: Secondary | ICD-10-CM | POA: Diagnosis present

## 2017-07-04 DIAGNOSIS — R140 Abdominal distension (gaseous): Secondary | ICD-10-CM

## 2017-07-04 DIAGNOSIS — Z86010 Personal history of colonic polyps: Secondary | ICD-10-CM

## 2017-07-04 DIAGNOSIS — E875 Hyperkalemia: Secondary | ICD-10-CM | POA: Diagnosis present

## 2017-07-04 DIAGNOSIS — R05 Cough: Secondary | ICD-10-CM | POA: Diagnosis not present

## 2017-07-04 DIAGNOSIS — Z825 Family history of asthma and other chronic lower respiratory diseases: Secondary | ICD-10-CM

## 2017-07-04 DIAGNOSIS — I110 Hypertensive heart disease with heart failure: Secondary | ICD-10-CM | POA: Diagnosis not present

## 2017-07-04 DIAGNOSIS — I482 Chronic atrial fibrillation: Secondary | ICD-10-CM | POA: Diagnosis present

## 2017-07-04 DIAGNOSIS — F609 Personality disorder, unspecified: Secondary | ICD-10-CM | POA: Diagnosis present

## 2017-07-04 DIAGNOSIS — E8889 Other specified metabolic disorders: Secondary | ICD-10-CM | POA: Diagnosis present

## 2017-07-04 DIAGNOSIS — I342 Nonrheumatic mitral (valve) stenosis: Secondary | ICD-10-CM | POA: Diagnosis present

## 2017-07-04 DIAGNOSIS — I272 Pulmonary hypertension, unspecified: Secondary | ICD-10-CM | POA: Diagnosis present

## 2017-07-04 DIAGNOSIS — Z886 Allergy status to analgesic agent status: Secondary | ICD-10-CM

## 2017-07-04 DIAGNOSIS — Z992 Dependence on renal dialysis: Secondary | ICD-10-CM

## 2017-07-04 DIAGNOSIS — Z7901 Long term (current) use of anticoagulants: Secondary | ICD-10-CM | POA: Diagnosis not present

## 2017-07-04 DIAGNOSIS — K922 Gastrointestinal hemorrhage, unspecified: Secondary | ICD-10-CM | POA: Diagnosis not present

## 2017-07-04 DIAGNOSIS — R0602 Shortness of breath: Secondary | ICD-10-CM | POA: Diagnosis not present

## 2017-07-04 DIAGNOSIS — K5901 Slow transit constipation: Secondary | ICD-10-CM | POA: Diagnosis not present

## 2017-07-04 DIAGNOSIS — E8770 Fluid overload, unspecified: Secondary | ICD-10-CM | POA: Diagnosis not present

## 2017-07-04 DIAGNOSIS — I2722 Pulmonary hypertension due to left heart disease: Secondary | ICD-10-CM | POA: Diagnosis present

## 2017-07-04 DIAGNOSIS — K7460 Unspecified cirrhosis of liver: Secondary | ICD-10-CM | POA: Diagnosis present

## 2017-07-04 DIAGNOSIS — S41102A Unspecified open wound of left upper arm, initial encounter: Secondary | ICD-10-CM | POA: Diagnosis not present

## 2017-07-04 DIAGNOSIS — K625 Hemorrhage of anus and rectum: Secondary | ICD-10-CM | POA: Diagnosis not present

## 2017-07-04 DIAGNOSIS — D500 Iron deficiency anemia secondary to blood loss (chronic): Secondary | ICD-10-CM | POA: Diagnosis not present

## 2017-07-04 DIAGNOSIS — Z801 Family history of malignant neoplasm of trachea, bronchus and lung: Secondary | ICD-10-CM

## 2017-07-04 DIAGNOSIS — I9789 Other postprocedural complications and disorders of the circulatory system, not elsewhere classified: Secondary | ICD-10-CM | POA: Diagnosis not present

## 2017-07-04 DIAGNOSIS — Z8249 Family history of ischemic heart disease and other diseases of the circulatory system: Secondary | ICD-10-CM

## 2017-07-04 DIAGNOSIS — I252 Old myocardial infarction: Secondary | ICD-10-CM

## 2017-07-04 DIAGNOSIS — K5900 Constipation, unspecified: Secondary | ICD-10-CM | POA: Diagnosis present

## 2017-07-04 HISTORY — PX: SKIN SPLIT GRAFT: SHX444

## 2017-07-04 HISTORY — DX: End stage renal disease: Z99.2

## 2017-07-04 HISTORY — DX: Atherosclerotic heart disease of native coronary artery without angina pectoris: I25.10

## 2017-07-04 HISTORY — DX: End stage renal disease: N18.6

## 2017-07-04 HISTORY — DX: Personal history of urinary calculi: Z87.442

## 2017-07-04 LAB — CBC
HCT: 26.1 % — ABNORMAL LOW (ref 39.0–52.0)
Hemoglobin: 8.3 g/dL — ABNORMAL LOW (ref 13.0–17.0)
MCH: 27.6 pg (ref 26.0–34.0)
MCHC: 31.8 g/dL (ref 30.0–36.0)
MCV: 86.7 fL (ref 78.0–100.0)
Platelets: 162 10*3/uL (ref 150–400)
RBC: 3.01 MIL/uL — ABNORMAL LOW (ref 4.22–5.81)
RDW: 20.8 % — ABNORMAL HIGH (ref 11.5–15.5)
WBC: 8.1 10*3/uL (ref 4.0–10.5)

## 2017-07-04 LAB — CREATININE, SERUM
Creatinine, Ser: 6.26 mg/dL — ABNORMAL HIGH (ref 0.61–1.24)
GFR calc Af Amer: 11 mL/min — ABNORMAL LOW (ref >60)
GFR calc non Af Amer: 9 mL/min — ABNORMAL LOW (ref >60)

## 2017-07-04 LAB — POCT I-STAT 4, (NA,K, GLUC, HGB,HCT)
Glucose, Bld: 100 mg/dL — ABNORMAL HIGH (ref 65–99)
HCT: 26 % — ABNORMAL LOW (ref 39.0–52.0)
Hemoglobin: 8.8 g/dL — ABNORMAL LOW (ref 13.0–17.0)
Potassium: 4.4 mmol/L (ref 3.5–5.1)
Sodium: 136 mmol/L (ref 135–145)

## 2017-07-04 SURGERY — APPLICATION, GRAFT, SKIN, SPLIT-THICKNESS
Anesthesia: General | Site: Arm Upper | Laterality: Left

## 2017-07-04 MED ORDER — SUCCINYLCHOLINE CHLORIDE 20 MG/ML IJ SOLN
INTRAMUSCULAR | Status: DC | PRN
  Administered 2017-07-04: 80 mg via INTRAVENOUS

## 2017-07-04 MED ORDER — CISATRACURIUM BESYLATE (PF) 10 MG/5ML IV SOLN
INTRAVENOUS | Status: DC | PRN
  Administered 2017-07-04: 4 mg via INTRAVENOUS

## 2017-07-04 MED ORDER — METOPROLOL TARTRATE 5 MG/5ML IV SOLN
2.0000 mg | INTRAVENOUS | Status: AC | PRN
  Administered 2017-07-05: 2 mg via INTRAVENOUS
  Administered 2017-07-05: 5 mg via INTRAVENOUS
  Filled 2017-07-04 (×2): qty 5

## 2017-07-04 MED ORDER — SEVELAMER CARBONATE 800 MG PO TABS
3200.0000 mg | ORAL_TABLET | Freq: Three times a day (TID) | ORAL | Status: DC
  Administered 2017-07-04 – 2017-07-08 (×7): 3200 mg via ORAL
  Filled 2017-07-04 (×10): qty 4

## 2017-07-04 MED ORDER — BACITRACIN ZINC 500 UNIT/GM EX OINT
TOPICAL_OINTMENT | CUTANEOUS | Status: AC
  Filled 2017-07-04: qty 28.35

## 2017-07-04 MED ORDER — ONDANSETRON HCL 4 MG/2ML IJ SOLN
INTRAMUSCULAR | Status: DC | PRN
  Administered 2017-07-04: 4 mg via INTRAVENOUS

## 2017-07-04 MED ORDER — MINOXIDIL 2.5 MG PO TABS
2.5000 mg | ORAL_TABLET | Freq: Two times a day (BID) | ORAL | Status: DC
  Administered 2017-07-04 – 2017-07-07 (×6): 2.5 mg via ORAL
  Filled 2017-07-04 (×10): qty 1

## 2017-07-04 MED ORDER — BACITRACIN ZINC 500 UNIT/GM EX OINT
TOPICAL_OINTMENT | CUTANEOUS | Status: DC | PRN
  Administered 2017-07-04: 1 via TOPICAL

## 2017-07-04 MED ORDER — DEXTROSE 5 % IV SOLN
INTRAVENOUS | Status: DC | PRN
  Administered 2017-07-04: 25 ug/min via INTRAVENOUS

## 2017-07-04 MED ORDER — ISOSORBIDE MONONITRATE ER 30 MG PO TB24
30.0000 mg | ORAL_TABLET | Freq: Every day | ORAL | Status: DC
  Administered 2017-07-04 – 2017-07-08 (×5): 30 mg via ORAL
  Filled 2017-07-04 (×5): qty 1

## 2017-07-04 MED ORDER — DILTIAZEM HCL ER COATED BEADS 120 MG PO CP24
120.0000 mg | ORAL_CAPSULE | Freq: Every day | ORAL | Status: DC
  Administered 2017-07-05 – 2017-07-08 (×4): 120 mg via ORAL
  Filled 2017-07-04 (×4): qty 1

## 2017-07-04 MED ORDER — ESMOLOL HCL 100 MG/10ML IV SOLN
INTRAVENOUS | Status: DC | PRN
  Administered 2017-07-04: 20 mg via INTRAVENOUS

## 2017-07-04 MED ORDER — MINERAL OIL LIGHT 100 % EX OIL
TOPICAL_OIL | CUTANEOUS | Status: DC | PRN
  Administered 2017-07-04: 1 via TOPICAL

## 2017-07-04 MED ORDER — PROPOFOL 10 MG/ML IV BOLUS
INTRAVENOUS | Status: AC
  Filled 2017-07-04: qty 20

## 2017-07-04 MED ORDER — MIDAZOLAM HCL 2 MG/2ML IJ SOLN
INTRAMUSCULAR | Status: DC | PRN
  Administered 2017-07-04 (×2): 1 mg via INTRAVENOUS

## 2017-07-04 MED ORDER — HEPARIN SODIUM (PORCINE) 5000 UNIT/ML IJ SOLN
5000.0000 [IU] | Freq: Three times a day (TID) | INTRAMUSCULAR | Status: DC
  Administered 2017-07-04 – 2017-07-15 (×20): 5000 [IU] via SUBCUTANEOUS
  Filled 2017-07-04 (×24): qty 1

## 2017-07-04 MED ORDER — PHENOL 1.4 % MT LIQD: 1.0000 | OROMUCOSAL | Status: DC | PRN

## 2017-07-04 MED ORDER — ONDANSETRON HCL 4 MG/2ML IJ SOLN
4.0000 mg | Freq: Four times a day (QID) | INTRAMUSCULAR | Status: DC | PRN
  Administered 2017-07-06 – 2017-07-11 (×2): 4 mg via INTRAVENOUS
  Filled 2017-07-04 (×3): qty 2

## 2017-07-04 MED ORDER — MOMETASONE FURO-FORMOTEROL FUM 200-5 MCG/ACT IN AERO
2.0000 | INHALATION_SPRAY | Freq: Two times a day (BID) | RESPIRATORY_TRACT | Status: DC
  Administered 2017-07-04 – 2017-07-08 (×6): 2 via RESPIRATORY_TRACT
  Filled 2017-07-04: qty 8.8

## 2017-07-04 MED ORDER — OXYCODONE HCL 5 MG PO TABS
5.0000 mg | ORAL_TABLET | ORAL | Status: DC | PRN
  Administered 2017-07-04 – 2017-07-07 (×6): 10 mg via ORAL
  Filled 2017-07-04 (×7): qty 2

## 2017-07-04 MED ORDER — FENTANYL CITRATE (PF) 100 MCG/2ML IJ SOLN: 25.0000 ug | INTRAMUSCULAR | Status: DC | PRN

## 2017-07-04 MED ORDER — FENTANYL CITRATE (PF) 250 MCG/5ML IJ SOLN
INTRAMUSCULAR | Status: AC
Start: 2017-07-04 — End: 2017-07-04
  Filled 2017-07-04: qty 5

## 2017-07-04 MED ORDER — ACETAMINOPHEN 325 MG PO TABS
325.0000 mg | ORAL_TABLET | ORAL | Status: DC | PRN
  Administered 2017-07-04: 650 mg via ORAL
  Filled 2017-07-04: qty 2

## 2017-07-04 MED ORDER — GABAPENTIN 100 MG PO CAPS
100.0000 mg | ORAL_CAPSULE | Freq: Two times a day (BID) | ORAL | Status: DC
  Administered 2017-07-04 – 2017-07-08 (×7): 100 mg via ORAL
  Filled 2017-07-04 (×8): qty 1

## 2017-07-04 MED ORDER — PROPOFOL 10 MG/ML IV BOLUS
INTRAVENOUS | Status: DC | PRN
  Administered 2017-07-04: 100 mg via INTRAVENOUS

## 2017-07-04 MED ORDER — NEOSTIGMINE METHYLSULFATE 10 MG/10ML IV SOLN
INTRAVENOUS | Status: DC | PRN
  Administered 2017-07-04: 3 mg via INTRAVENOUS

## 2017-07-04 MED ORDER — MINERAL OIL LIGHT 100 % EX OIL
TOPICAL_OIL | CUTANEOUS | Status: AC
  Filled 2017-07-04: qty 25

## 2017-07-04 MED ORDER — SENNA 8.6 MG PO TABS: 1.0000 | ORAL_TABLET | Freq: Every day | ORAL | Status: DC | PRN

## 2017-07-04 MED ORDER — NICOTINE 14 MG/24HR TD PT24
14.0000 mg | MEDICATED_PATCH | Freq: Every day | TRANSDERMAL | Status: DC
  Administered 2017-07-04 – 2017-07-15 (×10): 14 mg via TRANSDERMAL
  Filled 2017-07-04 (×14): qty 1

## 2017-07-04 MED ORDER — SODIUM CHLORIDE 0.9% FLUSH
3.0000 mL | Freq: Two times a day (BID) | INTRAVENOUS | Status: DC
  Administered 2017-07-04 – 2017-07-08 (×7): 3 mL via INTRAVENOUS

## 2017-07-04 MED ORDER — SODIUM CHLORIDE 0.9% FLUSH: 3.0000 mL | INTRAVENOUS | Status: DC | PRN

## 2017-07-04 MED ORDER — BUPIVACAINE-EPINEPHRINE 0.25% -1:200000 IJ SOLN
INTRAMUSCULAR | Status: DC | PRN
  Administered 2017-07-04: 30 mL

## 2017-07-04 MED ORDER — SODIUM CHLORIDE 0.9 % IJ SOLN
INTRAMUSCULAR | Status: DC | PRN
  Administered 2017-07-04: 50 mL

## 2017-07-04 MED ORDER — GUAIFENESIN-DM 100-10 MG/5ML PO SYRP: 15.0000 mL | ORAL_SOLUTION | ORAL | Status: DC | PRN

## 2017-07-04 MED ORDER — MELATONIN 3 MG PO TABS
6.0000 mg | ORAL_TABLET | Freq: Every evening | ORAL | Status: DC | PRN
  Filled 2017-07-04: qty 2

## 2017-07-04 MED ORDER — MORPHINE SULFATE (PF) 2 MG/ML IV SOLN
2.0000 mg | INTRAVENOUS | Status: DC | PRN
  Administered 2017-07-04: 2 mg via INTRAVENOUS
  Administered 2017-07-05: 5 mg via INTRAVENOUS
  Administered 2017-07-05: 2 mg via INTRAVENOUS
  Administered 2017-07-06: 5 mg via INTRAVENOUS
  Filled 2017-07-04 (×2): qty 1
  Filled 2017-07-04 (×2): qty 3

## 2017-07-04 MED ORDER — DEXTROSE 5 % IV SOLN
1.5000 g | INTRAVENOUS | Status: AC
  Administered 2017-07-04: 1.5 g via INTRAVENOUS

## 2017-07-04 MED ORDER — HYDRALAZINE HCL 20 MG/ML IJ SOLN
5.0000 mg | INTRAMUSCULAR | Status: AC | PRN
  Administered 2017-07-14 (×2): 5 mg via INTRAVENOUS
  Filled 2017-07-04 (×2): qty 1

## 2017-07-04 MED ORDER — ONDANSETRON HCL 4 MG/2ML IJ SOLN: 4.0000 mg | Freq: Once | INTRAMUSCULAR | Status: DC | PRN

## 2017-07-04 MED ORDER — DEXTROSE 5 % IV SOLN
INTRAVENOUS | Status: AC
  Filled 2017-07-04: qty 1.5

## 2017-07-04 MED ORDER — 0.9 % SODIUM CHLORIDE (POUR BTL) OPTIME
TOPICAL | Status: DC | PRN
  Administered 2017-07-04: 1000 mL

## 2017-07-04 MED ORDER — PANTOPRAZOLE SODIUM 40 MG PO TBEC
40.0000 mg | DELAYED_RELEASE_TABLET | Freq: Every day | ORAL | Status: DC
  Administered 2017-07-04 – 2017-07-08 (×5): 40 mg via ORAL
  Filled 2017-07-04 (×5): qty 1

## 2017-07-04 MED ORDER — CALCITRIOL 0.25 MCG PO CAPS
0.7500 ug | ORAL_CAPSULE | ORAL | Status: DC
  Filled 2017-07-04: qty 3

## 2017-07-04 MED ORDER — RENA-VITE PO TABS
1.0000 | ORAL_TABLET | Freq: Every day | ORAL | Status: DC
  Administered 2017-07-04 – 2017-07-17 (×10): 1 via ORAL
  Filled 2017-07-04 (×12): qty 1

## 2017-07-04 MED ORDER — GLYCOPYRROLATE 0.2 MG/ML IJ SOLN: INTRAMUSCULAR | Status: DC | PRN

## 2017-07-04 MED ORDER — EPINEPHRINE PF 1 MG/ML IJ SOLN
INTRAMUSCULAR | Status: DC | PRN
  Administered 2017-07-04: 1 mg

## 2017-07-04 MED ORDER — VITAMIN C 500 MG PO TABS
500.0000 mg | ORAL_TABLET | Freq: Two times a day (BID) | ORAL | Status: DC
  Administered 2017-07-04 – 2017-07-08 (×7): 500 mg via ORAL
  Filled 2017-07-04 (×8): qty 1

## 2017-07-04 MED ORDER — MIDAZOLAM HCL 2 MG/2ML IJ SOLN
INTRAMUSCULAR | Status: AC
  Filled 2017-07-04: qty 2

## 2017-07-04 MED ORDER — DOCUSATE SODIUM 100 MG PO CAPS
100.0000 mg | ORAL_CAPSULE | Freq: Two times a day (BID) | ORAL | Status: DC
  Administered 2017-07-04 – 2017-07-08 (×7): 100 mg via ORAL
  Filled 2017-07-04 (×9): qty 1

## 2017-07-04 MED ORDER — BUPIVACAINE-EPINEPHRINE (PF) 0.25% -1:200000 IJ SOLN
INTRAMUSCULAR | Status: AC
  Filled 2017-07-04: qty 30

## 2017-07-04 MED ORDER — FENTANYL CITRATE (PF) 250 MCG/5ML IJ SOLN
INTRAMUSCULAR | Status: DC | PRN
  Administered 2017-07-04: 50 ug via INTRAVENOUS

## 2017-07-04 MED ORDER — GLYCOPYRROLATE 0.2 MG/ML IJ SOLN
INTRAMUSCULAR | Status: DC | PRN
  Administered 2017-07-04: 0.4 mg via INTRAVENOUS

## 2017-07-04 MED ORDER — ZOLPIDEM TARTRATE 5 MG PO TABS
10.0000 mg | ORAL_TABLET | Freq: Every evening | ORAL | Status: DC | PRN
  Administered 2017-07-04 – 2017-07-05 (×2): 10 mg via ORAL
  Filled 2017-07-04 (×4): qty 2

## 2017-07-04 MED ORDER — LABETALOL HCL 5 MG/ML IV SOLN
10.0000 mg | INTRAVENOUS | Status: DC | PRN
  Administered 2017-07-13 (×2): 10 mg via INTRAVENOUS
  Filled 2017-07-04 (×2): qty 4

## 2017-07-04 MED ORDER — ACETAMINOPHEN 650 MG RE SUPP: 325.0000 mg | RECTAL | Status: DC | PRN

## 2017-07-04 MED ORDER — SODIUM CHLORIDE 0.9 % IV SOLN: 250.0000 mL | INTRAVENOUS | Status: DC | PRN

## 2017-07-04 MED ORDER — LIDOCAINE 2% (20 MG/ML) 5 ML SYRINGE
INTRAMUSCULAR | Status: DC | PRN
  Administered 2017-07-04: 100 mg via INTRAVENOUS

## 2017-07-04 MED ORDER — SODIUM CHLORIDE 0.9 % IV SOLN
INTRAVENOUS | Status: DC
  Administered 2017-07-04 – 2017-07-10 (×2): via INTRAVENOUS
  Administered 2017-07-16: 10 mL/h via INTRAVENOUS

## 2017-07-04 MED ORDER — CISATRACURIUM BESYLATE 20 MG/10ML IV SOLN
INTRAVENOUS | Status: AC
  Filled 2017-07-04: qty 10

## 2017-07-04 MED ORDER — EPINEPHRINE PF 1 MG/ML IJ SOLN
INTRAMUSCULAR | Status: AC
  Filled 2017-07-04: qty 1

## 2017-07-04 MED ORDER — SODIUM CHLORIDE 0.9 % IV SOLN
INTRAVENOUS | Status: DC
  Administered 2017-07-04 (×3): via INTRAVENOUS

## 2017-07-04 SURGICAL SUPPLY — 62 items
APL SKNCLS STERI-STRIP NONHPOA (GAUZE/BANDAGES/DRESSINGS)
BANDAGE ACE 4X5 VEL STRL LF (GAUZE/BANDAGES/DRESSINGS) IMPLANT
BANDAGE ACE 6X5 VEL STRL LF (GAUZE/BANDAGES/DRESSINGS) IMPLANT
BANDAGE ELASTIC 6 VELCRO ST LF (GAUZE/BANDAGES/DRESSINGS) ×2 IMPLANT
BENZOIN TINCTURE PRP APPL 2/3 (GAUZE/BANDAGES/DRESSINGS) ×2 IMPLANT
BLADE 10 SAFETY STRL DISP (BLADE) ×3 IMPLANT
BLADE CLIPPER SURG (BLADE) IMPLANT
BLADE DERMATOME II (BLADE) ×3 IMPLANT
BNDG GAUZE ELAST 4 BULKY (GAUZE/BANDAGES/DRESSINGS) ×2 IMPLANT
CANISTER SUCT 3000ML PPV (MISCELLANEOUS) IMPLANT
CANISTER WOUND CARE 500ML ATS (WOUND CARE) IMPLANT
COVER SURGICAL LIGHT HANDLE (MISCELLANEOUS) ×3 IMPLANT
DEPRESSOR TONGUE BLADE STERILE (MISCELLANEOUS) ×2 IMPLANT
DERMACARRIERS GRAFT 1 TO 1.5 (DISPOSABLE) ×6
DRAPE HALF SHEET 40X57 (DRAPES) ×3 IMPLANT
DRAPE INCISE IOBAN 66X45 STRL (DRAPES) ×3 IMPLANT
DRAPE ORTHO SPLIT 77X108 STRL (DRAPES) ×6
DRAPE SURG ORHT 6 SPLT 77X108 (DRAPES) ×2 IMPLANT
DRSG ADAPTIC 3X8 NADH LF (GAUZE/BANDAGES/DRESSINGS) IMPLANT
DRSG OPSITE 6X11 MED (GAUZE/BANDAGES/DRESSINGS) IMPLANT
DRSG PAD ABDOMINAL 8X10 ST (GAUZE/BANDAGES/DRESSINGS) ×3 IMPLANT
DRSG TELFA 3X8 NADH (GAUZE/BANDAGES/DRESSINGS) ×6 IMPLANT
DRSG VAC ATS LRG SENSATRAC (GAUZE/BANDAGES/DRESSINGS) IMPLANT
DRSG VAC ATS MED SENSATRAC (GAUZE/BANDAGES/DRESSINGS) ×2 IMPLANT
DRSG VAC ATS SM SENSATRAC (GAUZE/BANDAGES/DRESSINGS) IMPLANT
DRSG VERSA FOAM LRG 10X15 (GAUZE/BANDAGES/DRESSINGS) ×2 IMPLANT
ELECT REM PT RETURN 9FT ADLT (ELECTROSURGICAL)
ELECTRODE REM PT RTRN 9FT ADLT (ELECTROSURGICAL) IMPLANT
FILTER STRAW FLUID ASPIR (MISCELLANEOUS) ×3 IMPLANT
GAUZE SPONGE 4X4 12PLY STRL (GAUZE/BANDAGES/DRESSINGS) ×1 IMPLANT
GAUZE XEROFORM 5X9 LF (GAUZE/BANDAGES/DRESSINGS) ×5 IMPLANT
GEL ULTRASOUND 20GR AQUASONIC (MISCELLANEOUS) IMPLANT
GLOVE BIO SURGEON STRL SZ 6.5 (GLOVE) ×4 IMPLANT
GLOVE BIO SURGEONS STRL SZ 6.5 (GLOVE) ×2
GLOVE BIOGEL PI IND STRL 6.5 (GLOVE) IMPLANT
GLOVE BIOGEL PI IND STRL 8 (GLOVE) IMPLANT
GLOVE BIOGEL PI INDICATOR 6.5 (GLOVE) ×4
GLOVE BIOGEL PI INDICATOR 8 (GLOVE) ×2
GOWN STRL REUS W/ TWL LRG LVL3 (GOWN DISPOSABLE) ×2 IMPLANT
GOWN STRL REUS W/TWL LRG LVL3 (GOWN DISPOSABLE) ×6
GRAFT DERMACARRIERS 1 TO 1.5 (DISPOSABLE) ×1 IMPLANT
HANDPIECE INTERPULSE COAX TIP (DISPOSABLE)
KIT BASIN OR (CUSTOM PROCEDURE TRAY) ×3 IMPLANT
KIT ROOM TURNOVER OR (KITS) ×3 IMPLANT
NDL SPNL 18GX3.5 QUINCKE PK (NEEDLE) ×1 IMPLANT
NEEDLE SPNL 18GX3.5 QUINCKE PK (NEEDLE) ×3 IMPLANT
NS IRRIG 1000ML POUR BTL (IV SOLUTION) ×3 IMPLANT
PACK GENERAL/GYN (CUSTOM PROCEDURE TRAY) ×3 IMPLANT
PAD ARMBOARD 7.5X6 YLW CONV (MISCELLANEOUS) ×6 IMPLANT
PAD DRESSING TELFA 3X8 NADH (GAUZE/BANDAGES/DRESSINGS) ×2 IMPLANT
SET HNDPC FAN SPRY TIP SCT (DISPOSABLE) IMPLANT
STAPLER VISISTAT 35W (STAPLE) ×3 IMPLANT
SUT CHROMIC 4 0 PS 2 18 (SUTURE) IMPLANT
SUT SILK 4 0 PS 2 (SUTURE) IMPLANT
SUT VIC AB 5-0 P-3 18XBRD (SUTURE) IMPLANT
SUT VIC AB 5-0 P3 18 (SUTURE)
SUT VIC AB 5-0 PS2 18 (SUTURE) IMPLANT
SYR 3ML 25GX5/8 SAFETY (SYRINGE) ×3 IMPLANT
SYR CONTROL 10ML LL (SYRINGE) ×3 IMPLANT
TOWEL OR 17X24 6PK STRL BLUE (TOWEL DISPOSABLE) ×3 IMPLANT
TOWEL OR 17X26 10 PK STRL BLUE (TOWEL DISPOSABLE) ×3 IMPLANT
UNDERPAD 30X30 (UNDERPADS AND DIAPERS) ×3 IMPLANT

## 2017-07-04 NOTE — Interval H&P Note (Signed)
History and Physical Interval Note:  07/04/2017 8:45 AM  [name]  has presented today for surgery, with the diagnosis of wound defect  The various methods of treatment have been discussed with the patient and family. After consideration of risks, benefits and other options for treatment, the patient has consented to  Procedure(s): SKIN GRAFT SPLIT THICKNESS LEFT ARM DONOR SITE: ANTERIOR THIGH (Left) as a surgical intervention .  The patient's history has been reviewed, patient examined, no change in status, stable for surgery.  I have reviewed the patient's chart and labs.  Questions were answered to the patient's satisfaction.     Ruta Hinds

## 2017-07-04 NOTE — Anesthesia Procedure Notes (Signed)
Procedure Name: Intubation Date/Time: 07/04/2017 11:47 AM Performed by: Bryson Corona, CRNA Pre-anesthesia Checklist: Patient identified, Emergency Drugs available, Suction available and Patient being monitored Patient Re-evaluated:Patient Re-evaluated prior to induction Oxygen Delivery Method: Circle System Utilized Preoxygenation: Pre-oxygenation with 100% oxygen Induction Type: IV induction Ventilation: Mask ventilation without difficulty Laryngoscope Size: Mac and 4 Grade View: Grade I Tube type: Oral Tube size: 7.0 mm Number of attempts: 1 Airway Equipment and Method: Stylet and Oral airway Placement Confirmation: ETT inserted through vocal cords under direct vision,  positive ETCO2 and breath sounds checked- equal and bilateral Secured at: 22 cm Tube secured with: Tape Dental Injury: Teeth and Oropharynx as per pre-operative assessment

## 2017-07-04 NOTE — Progress Notes (Signed)
During Shift changing,Nurse tech reported the patient is bleeding on the donor Graft side of the left leg. Bleeding is profuse and ace  bandage is soaked up with the blood.called the rapid response team and the surgeon DR. Fields.Doctor ,he gave the order for remove the old ace wrap and apply new ace bandage on the site with pressure dressing. Applied the new bandage and checked the pedal pulse on the left leg ,it is moderate. Rapid response came and assessed the patient and no new order given.Patient was complaining the pain no 8  ,inj.Morphine 2 mg Iv given

## 2017-07-04 NOTE — Anesthesia Preprocedure Evaluation (Addendum)
Anesthesia Evaluation  Patient identified by MRN, date of birth, ID band Patient awake    Reviewed: Allergy & Precautions, NPO status , Patient's Chart, lab work & pertinent test results  Airway Mallampati: II  TM Distance: >3 FB Neck ROM: Full    Dental  (+) Teeth Intact, Dental Advisory Given   Pulmonary shortness of breath, COPD, Current Smoker,    Pulmonary exam normal breath sounds clear to auscultation       Cardiovascular hypertension, Pt. on medications and Pt. on home beta blockers + CAD, + Past MI, + Cardiac Stents (s/p DES to RCA 02/22/17) and + Peripheral Vascular Disease  + dysrhythmias (atrial fibrillation/flutter (no anticoagulation due to compliance issues)) Atrial Fibrillation  Rhythm:Irregular Rate:Abnormal  Echo 06/01/17:  - Left ventricle: The cavity size was normal. Wall thickness was increased in a pattern of moderate LVH. The study is nottechnically sufficient to allow evaluation of LV diastolicfunction. - Mitral valve: Calcified annulus and basal leaflets severe MSbased on gradients. Moderately calcified annulus. Moderatelythickened leaflets . There was mild regurgitation. - Left atrium: The atrium was moderately dilated. - Right ventricle: The cavity size was moderately dilated. - Right atrium: The atrium was moderately dilated. - Atrial septum: No defect or patent foramen ovale was identified. - Tricuspid valve: There was severe regurgitation. - Pulmonary arteries: PA peak pressure: 95 mm Hg (S). - Pericardium, extracardiac: Small posterior pericardial effusion     Neuro/Psych PSYCHIATRIC DISORDERS Anxiety negative neurological ROS     GI/Hepatic GERD  ,(+)     substance abuse (hx cocaine abuse)  , Hepatitis -, C, B  Endo/Other  negative endocrine ROS  Renal/GU CRF, Dialysis and ESRFRenal disease (MWF Dialysis)K+ 4.4     Musculoskeletal  (+) Arthritis , Osteoarthritis,    Abdominal    Peds  Hematology  (+) Blood dyscrasia, anemia ,   Anesthesia Other Findings Day of surgery medications reviewed with the patient.  Reproductive/Obstetrics                            Anesthesia Physical Anesthesia Plan  ASA: III  Anesthesia Plan: General   Post-op Pain Management:    Induction: Intravenous  PONV Risk Score and Plan: 1 and Ondansetron, Treatment may vary due to age or medical condition and Midazolam  Airway Management Planned: Oral ETT  Additional Equipment:   Intra-op Plan:   Post-operative Plan: Extubation in OR  Informed Consent: I have reviewed the patients History and Physical, chart, labs and discussed the procedure including the risks, benefits and alternatives for the proposed anesthesia with the patient or authorized representative who has indicated his/her understanding and acceptance.   Dental advisory given  Plan Discussed with: CRNA  Anesthesia Plan Comments: (Risks/benefits of general anesthesia discussed with patient including risk of damage to teeth, lips, gum, and tongue, nausea/vomiting, allergic reactions to medications, and the possibility of heart attack, stroke and death.  All patient questions answered.  Patient wishes to proceed.)       Anesthesia Quick Evaluation

## 2017-07-04 NOTE — Progress Notes (Signed)
NURSING PROGRESS NOTE  [name] [id number] Admission Data: 07/04/2017 6:22 PM Attending Provider: Elam Dutch, MD QDI:[id number], [name], MD Code Status: full  Allergies:  Aspirin; Clonidine derivatives; and Tramadol Past Medical History:   has a past medical history of Adenomatous colon polyp, Anemia, Anxiety, Arthritis, Atherosclerosis of aorta ([place]), Cardiomegaly, Chest pain, Cocaine abuse ([place]), COPD exacerbation ([place]) (08/17/2016), Coronary artery disease, Dialysis patient ([place]), ESRD (end stage renal disease) on dialysis ([place]), GERD (gastroesophageal reflux disease), Hemorrhoids, Hepatitis B, chronic ([place]), Hepatitis C, History of kidney stones, Hyperkalemia, Hypertension, Metabolic bone disease, Myocardial infarction ([place]), Pneumonia, Pulmonary edema, Renal disorder, Renal insufficiency, Shortness of breath dyspnea, and Tubular adenoma of colon. Past Surgical History:   has a past surgical history that includes AV fistula placement (2012); Hemorrhoid banding; Ligation of arteriovenous  fistula (Left, 12/14/2015); Revison of arteriovenous fistula (Left, 01/20/2016); Revison of arteriovenous fistula (Left, 07/24/2016); Colonoscopy; Polypectomy; LEFT HEART CATH AND CORONARY ANGIOGRAPHY (N/A, 02/22/2017); CORONARY STENT INTERVENTION (N/A, 02/22/2017); A/V Fistulagram (Left, 05/26/2017); Insertion of dialysis catheter (05/30/2017); Insertion of dialysis catheter (N/A, 05/30/2017); I&D extremity (Left, 06/01/2017); Wound exploration (Left, 06/03/2017); Thrombectomy w/ embolectomy (Left, 06/05/2017); I&D extremity (Left, 06/14/2017); Application if wound vac (Left, 06/14/2017); and Skin graft split thickness leg / foot (Left). Social History:   reports that he has been smoking cigarettes.  He started smoking about 43 years ago. He has a 21.50 pack-year smoking history. he has never used smokeless tobacco. He reports that he drinks alcohol. He reports that he uses drugs. Drugs: Marijuana and  Cocaine.  [name] is a 56 y.o. male patient admitted from ED:   Last Documented Vital Signs: Blood pressure 110/63, pulse 84, temperature 98.4 F (36.9 C), temperature source Oral, resp. rate (!) 24, height 5\' 9"  (1.753 m), weight 75.2 kg (165 lb 12.8 oz), SpO2 94 %.  Cardiac Monitoring: Box # 13 in place. Cardiac monitor yields:normal sinus rhythm.  IV Fluids:  IV in place, occlusive dsg intact without redness, IV cath hand right condition patent and no redness normal saline.    Patient/Family orientated to room. Information packet given to patient/family. Admission inpatient armband information verified with patient/family to include name and date of birth and placed on patient arm. Side rails up x 2, fall assessment and education completed with patient/family. Patient/family able to verbalize understanding of risk associated with falls and verbalized understanding to call for assistance before getting out of bed. Call light within reach. Patient/family able to voice and demonstrate understanding of unit orientation instructions.    Will continue to evaluate and treat per MD orders.   Talbert Forest RN

## 2017-07-04 NOTE — Op Note (Signed)
Procedure: Split thickness skin graft left arm with left thigh donor site  Preoperative diagnosis: Open wound left arm  Postoperative diagnosis: Same  Anesthesia: General  Assistant: Graylon Good RNFA  Operative findings: 2 pieces of skin harvested from the left anterior thigh meshed 1-1.5 and intact to left medial arm VAC dressing placed at 125 mmHg continuous therapy for the next 3 days.  10 x 8 cm diameter wound  Operative details: After obtaining informed consent, the patient was taken to the operating room.  The patient was placed in supine position on the operating table.  After induction of general anesthesia and endotracheal intubation, the patient's entire left upper extremity and left anterior thigh were prepped and draped in usual sterile fashion.  Mineral oil was applied to the patient's left anterior thigh.  A Zimmer dermatome was used to harvest 2 swipes of skin at the 0.020 thickness.  This was then placed on a carrier and meshed 1-1.5.  The wound in the left arm was debrided and clean.  This was irrigated with normal saline solution.  The wound was 10 x 8 cm in diameter.  The skin was then tacked to the wound with staples.  A thick coat of bacitracin ointment was applied to the skin.  A VAC dressing was then applied.  This was placed at 125 mmHg continuous.  The donor site was sprayed with quarter percent Marcaine with epinephrine.  It was hemostatic.  Xeroform and a dry sterile dressing was applied to this.  The patient tolerated the procedure well and there were no complications.  The instrument sponge needle count was correct at the end of the case.  She was taken to the recovery room in stable condition.  Ruta Hinds, MD [facility] Office: [phone] Pager: [phone]

## 2017-07-04 NOTE — Progress Notes (Signed)
Pt made aware of the surgery moving ahead. Pt sts he will call his ride and try to be here ASAP.

## 2017-07-04 NOTE — Transfer of Care (Signed)
Immediate Anesthesia Transfer of Care Note  Patient: [name]  Procedure(s) Performed: SKIN GRAFT SPLIT THICKNESS LEFT ARM DONOR SITE: LEFT ANTERIOR THIGH (Left Arm Upper)  Patient Location: PACU  Anesthesia Type:General  Level of Consciousness: awake, alert  and oriented  Airway & Oxygen Therapy: Patient Spontanous Breathing  Post-op Assessment: Report given to RN and Post -op Vital signs reviewed and stable  Post vital signs: Reviewed and stable  Last Vitals:  Vitals:   07/04/17 0802  BP: (!) 168/78  Pulse: 74  Resp: 18  Temp: 36.4 C  SpO2: 100%    Last Pain:  Vitals:   07/04/17 0802  TempSrc: Oral  PainSc: 0-No pain         Complications: No apparent anesthesia complications

## 2017-07-04 NOTE — Progress Notes (Signed)
5,000U heparin subq doses held during this shift due to bleeding surgical site.  Will continue to monitor.

## 2017-07-04 NOTE — Anesthesia Postprocedure Evaluation (Signed)
Anesthesia Post Note  Patient: [name]  Procedure(s) Performed: SKIN GRAFT SPLIT THICKNESS LEFT ARM DONOR SITE: LEFT ANTERIOR THIGH (Left Arm Upper)     Patient location during evaluation: PACU Anesthesia Type: General Level of consciousness: awake and alert Pain management: pain level controlled Vital Signs Assessment: post-procedure vital signs reviewed and stable Respiratory status: spontaneous breathing, nonlabored ventilation and respiratory function stable Cardiovascular status: blood pressure returned to baseline and stable Postop Assessment: no apparent nausea or vomiting Anesthetic complications: no    Last Vitals:  Vitals:   07/04/17 1331 07/04/17 1345  BP: 118/77   Pulse: (!) 136 64  Resp: 19 16  Temp:    SpO2: 94% 93%    Last Pain:  Vitals:   07/04/17 1330  TempSrc:   PainSc: 0-No pain                 Catalina Gravel

## 2017-07-05 ENCOUNTER — Encounter (HOSPITAL_COMMUNITY): Payer: Self-pay | Admitting: Vascular Surgery

## 2017-07-05 LAB — BASIC METABOLIC PANEL
Anion gap: 12 (ref 5–15)
BUN: 25 mg/dL — ABNORMAL HIGH (ref 6–20)
CO2: 31 mmol/L (ref 22–32)
Calcium: 9.1 mg/dL (ref 8.9–10.3)
Chloride: 92 mmol/L — ABNORMAL LOW (ref 101–111)
Creatinine, Ser: 6.86 mg/dL — ABNORMAL HIGH (ref 0.61–1.24)
GFR calc Af Amer: 9 mL/min — ABNORMAL LOW (ref >60)
GFR calc non Af Amer: 8 mL/min — ABNORMAL LOW (ref >60)
Glucose, Bld: 104 mg/dL — ABNORMAL HIGH (ref 65–99)
Potassium: 5.2 mmol/L — ABNORMAL HIGH (ref 3.5–5.1)
Sodium: 135 mmol/L (ref 135–145)

## 2017-07-05 MED ORDER — DIPHENHYDRAMINE HCL 25 MG PO CAPS
25.0000 mg | ORAL_CAPSULE | Freq: Four times a day (QID) | ORAL | Status: DC | PRN
  Administered 2017-07-05: 25 mg via ORAL
  Filled 2017-07-05: qty 1

## 2017-07-05 NOTE — Progress Notes (Signed)
[facility]  Subjective  - slept ok   Objective 114/69 85 98.8 F (37.1 C) (Oral) 18 96%  Intake/Output Summary (Last 24 hours) at 07/05/2017 0751 Last data filed at 07/04/2017 2313 Gross per 24 hour  Intake 947 ml  Output 15 ml  Net 932 ml   VAC with good seal left upper arm Donor site with no further bleeding   Assessment/Planning: POD #1 STSG will most likely take VAC down on 12/27 or 12/28 Run of vtach last pm asymptomatic follow for now, currently afib, will avoid full anticoagulation for now due to tenuous skin graft and any bleeding could cause failure  Will check 12 lead EKG and may need cardiology involvement if rate control is poor.  Ruta Hinds 07/05/2017 7:51 AM --  Laboratory Lab Results: Recent Labs    07/04/17 0827 07/04/17 1757  WBC  --  8.1  HGB 8.8* 8.3*  HCT 26.0* 26.1*  PLT  --  162   BMET Recent Labs    07/04/17 0827 07/04/17 1757 07/05/17 0233  NA 136  --  135  K 4.4  --  5.2*  CL  --   --  92*  CO2  --   --  31  GLUCOSE 100*  --  104*  BUN  --   --  25*  CREATININE  --  6.26* 6.86*  CALCIUM  --   --  9.1    COAG Lab Results  Component Value Date   INR 1.20 06/14/2017   INR 1.20 06/04/2017   INR 1.17 06/03/2017   No results found for: PTT

## 2017-07-05 NOTE — Progress Notes (Signed)
Notified by central telemetry ,patient heart rate jumped in to 120 -140 and EKG shows AFIB and PVC Patient is asymptotic..Informed to Dr.Field and advised to keep monitoring the blood pressure of the patient.

## 2017-07-05 NOTE — Progress Notes (Signed)
Pt seen and HD orders written for tomorrow.  Full note to follow tomorrow.     HD [place] MWF   4h  2/2 bath  Hep 3000   RIJ cath   [name] MD [facility] pgr [phone]   07/05/2017, 3:42 PM

## 2017-07-05 NOTE — Progress Notes (Signed)
Responded to pt's call, who stated "my IV is too deep in my arm."  Upon assessment of the IV, it is placed in the right antecubital, flushes properly, and is fully inserted into the vein.  Reassured pt and will continue to monitor.

## 2017-07-05 NOTE — Progress Notes (Signed)
RN called for increase amount of bleeding from Left thigh donor graft site. MD paged prior to my arrival. RN redressed area, applying more pressure. Palpable pedal pulse and marked. Pt with extreme pain. Advised RN to give morphine 2 mg IVP for break through pain. Also advised RN to obtain VS prior to medicating with narcotics and to assess pedal pulse every 4 hours. Encouraged to call RRT as needed. No interventions from me.

## 2017-07-05 NOTE — Progress Notes (Signed)
EKG shows Afib rate less than 100 Monitor for now Pt has history of paroxysmal afib In past not a coumadin candidate due to non compliance If no bleeding today will consider heparin drip while in hospital if afib persists  Ruta Hinds, MD [facility]: [phone] Pager: [phone]

## 2017-07-05 NOTE — Progress Notes (Signed)
Received notification from central monitoring that pt had become tachycardic to 165, non-sustained.  On assessment, pt is sleeping and asymptomatic.  HR currently A. Fib ranging from 70-100.  Will continue to monitor.

## 2017-07-05 NOTE — Progress Notes (Signed)
Patient is complaining  of itching all over the body.called the doctor Field he gave the verbal order for T. Benadryl 25 mg q6h prn .

## 2017-07-06 LAB — RENAL FUNCTION PANEL
Albumin: 3.1 g/dL — ABNORMAL LOW (ref 3.5–5.0)
Anion gap: 13 (ref 5–15)
BUN: 37 mg/dL — ABNORMAL HIGH (ref 6–20)
CO2: 29 mmol/L (ref 22–32)
Calcium: 9.4 mg/dL (ref 8.9–10.3)
Chloride: 92 mmol/L — ABNORMAL LOW (ref 101–111)
Creatinine, Ser: 9.26 mg/dL — ABNORMAL HIGH (ref 0.61–1.24)
GFR calc Af Amer: 7 mL/min — ABNORMAL LOW (ref >60)
GFR calc non Af Amer: 6 mL/min — ABNORMAL LOW (ref >60)
Glucose, Bld: 97 mg/dL (ref 65–99)
Phosphorus: 5.2 mg/dL — ABNORMAL HIGH (ref 2.5–4.6)
Potassium: 5.9 mmol/L — ABNORMAL HIGH (ref 3.5–5.1)
Sodium: 134 mmol/L — ABNORMAL LOW (ref 135–145)

## 2017-07-06 MED ORDER — LIDOCAINE-PRILOCAINE 2.5-2.5 % EX CREA: 1.0000 "application " | TOPICAL_CREAM | CUTANEOUS | Status: DC | PRN

## 2017-07-06 MED ORDER — HEPARIN SODIUM (PORCINE) 1000 UNIT/ML DIALYSIS: 1000.0000 [IU] | INTRAMUSCULAR | Status: DC | PRN

## 2017-07-06 MED ORDER — ALTEPLASE 2 MG IJ SOLR: 2.0000 mg | Freq: Once | INTRAMUSCULAR | Status: DC | PRN

## 2017-07-06 MED ORDER — LIDOCAINE HCL (PF) 1 % IJ SOLN: 5.0000 mL | INTRAMUSCULAR | Status: DC | PRN

## 2017-07-06 MED ORDER — PENTAFLUOROPROP-TETRAFLUOROETH EX AERO: 1.0000 "application " | INHALATION_SPRAY | CUTANEOUS | Status: DC | PRN

## 2017-07-06 MED ORDER — SODIUM CHLORIDE 0.9 % IV SOLN: 100.0000 mL | INTRAVENOUS | Status: DC | PRN

## 2017-07-06 MED ORDER — CALCITRIOL 0.5 MCG PO CAPS
ORAL_CAPSULE | ORAL | Status: AC
  Filled 2017-07-06: qty 2

## 2017-07-06 NOTE — Progress Notes (Signed)
[facility] [place]  Subjective  - no complaints   Objective 118/77 (!) 125 98.4 F (36.9 C) 16 97%  Intake/Output Summary (Last 24 hours) at 07/06/2017 0602 Last data filed at 07/05/2017 2118 Gross per 24 hour  Intake 506 ml  Output 125 ml  Net 381 ml   Left arm VAC with good seal Left leg donor site taken down to xeroform, some oozing but clean  Assessment/Planning: S/p STSG left arm.  Let donor site dry out today, leave xeroform keep uncovered Will most likely remove VAC tomorrow Possible d/c 12/27 or 12/28 depending on how graft looks  Afib intermittent rate control continue to observe.  Hold on heparin for now.  Deemed previously not to be anticoagulation candidate outpt due to non compliance.  High risk for bleeding currently in hospital.  ESRD dialysis per [place] 07/06/2017 6:02 AM --  Laboratory Lab Results: Recent Labs    07/04/17 0827 07/04/17 1757  WBC  --  8.1  HGB 8.8* 8.3*  HCT 26.0* 26.1*  PLT  --  162   BMET Recent Labs    07/04/17 0827 07/04/17 1757 07/05/17 0233  NA 136  --  135  K 4.4  --  5.2*  CL  --   --  92*  CO2  --   --  31  GLUCOSE 100*  --  104*  BUN  --   --  25*  CREATININE  --  6.26* 6.86*  CALCIUM  --   --  9.1    COAG Lab Results  Component Value Date   INR 1.20 06/14/2017   INR 1.20 06/04/2017   INR 1.17 06/03/2017   No results found for: PTT

## 2017-07-06 NOTE — Consult Note (Signed)
Renal Service Consult Note [facility]  Jermar Colter [facility] 07/06/2017 [name] Requesting Physician:  Dr Oneida Alar  Reason for Consult:  ESRD pt sp skin grafting  HPI: The patient is a 56 y.o. year-old with hx of COPD, CAD, substance abuse in the past, hepatitis B/C, HTN who was recently was admitted in last November for AVF infiltration in LUA w/ large hematoma.  Underwent evac of hematoma and AVF ligation on 06/03/17 by VVS.  Now dialyzing via R IJ TDC.  Had some skin necrosis and was readmitted now and underwent STSG to L upper arm from L thigh on 12/24 per VVS.  Had some bleeding postop from thigh harvest site which has improved.  Asked to see for ESRD.    Pt w/o complaints.  No sig SOB, leg edema, no abd pain , no CP.   ROS  denies CP  no joint pain   no HA  no blurry vision  no rash  no diarrhea  no nausea/ vomiting   Past Medical History  Past Medical History:  Diagnosis Date  . Adenomatous colon polyp    tubular  . Anemia   . Anxiety   . Arthritis    left shoulder  . Atherosclerosis of aorta ([place])   . Cardiomegaly   . Chest pain    DATE UNKNOWN, C/O PERIODICALLY  . Cocaine abuse ([place])   . COPD exacerbation ([place]) 08/17/2016  . Coronary artery disease    stent 02/22/17  . Dialysis patient ([place])    Monday-Wednesday-Friday  . ESRD (end stage renal disease) on dialysis ([place])    "[name]; MWF" (07/04/2017)  . GERD (gastroesophageal reflux disease)    DATE UNKNOWN  . Hemorrhoids   . Hepatitis B, chronic ([place])   . Hepatitis C   . History of kidney stones   . Hyperkalemia   . Hypertension   . Metabolic bone disease    Patient denies  . Myocardial infarction ([place])   . Pneumonia   . Pulmonary edema   . Renal disorder   . Renal insufficiency   . Shortness of breath dyspnea    " for the last past year with this dialysis"  . Tubular adenoma of colon    Past Surgical History  Past Surgical History:  Procedure Laterality Date  . A/V  FISTULAGRAM Left 05/26/2017   Procedure: A/V FISTULAGRAM;  Surgeon: Conrad Waltham, MD;  Location: [facility];  Service: Cardiovascular;  Laterality: Left;  . APPLICATION OF WOUND VAC Left 06/14/2017   Procedure: APPLICATION OF WOUND VAC;  Surgeon: Katha Cabal, MD;  Location: [facility] ORS;  Service: Vascular;  Laterality: Left;  . AV FISTULA PLACEMENT  2012   BELIEVED WAS PLACED IN JUNE  . COLONOSCOPY    . CORONARY STENT INTERVENTION N/A 02/22/2017   Procedure: CORONARY STENT INTERVENTION;  Surgeon: Nigel Mormon, MD;  Location: [place] [facility];  Service: Cardiovascular;  Laterality: N/A;  . HEMORRHOID BANDING    . I&D EXTREMITY Left 06/01/2017   Procedure: IRRIGATION AND DEBRIDEMENT LEFT ARM HEMATOMA WITH LIGATION OF LEFT ARM AV FISTULA;  Surgeon: Elam Dutch, MD;  Location: [facility];  Service: Vascular;  Laterality: Left;  . I&D EXTREMITY Left 06/14/2017   Procedure: IRRIGATION AND DEBRIDEMENT EXTREMITY;  Surgeon: Katha Cabal, MD;  Location: [facility] OR[facility];  Service: Vascular;  Laterality: Left;  . INSERTION OF DIALYSIS CATHETER  05/30/2017  . INSERTION OF DIALYSIS CATHETER N/A 05/30/2017   Procedure: INSERTION  OF DIALYSIS CATHETER;  Surgeon: Elam Dutch, MD;  Location: [facility];  Service: Vascular;  Laterality: N/A;  . LEFT HEART CATH AND CORONARY ANGIOGRAPHY N/A 02/22/2017   Procedure: LEFT HEART CATH AND CORONARY ANGIOGRAPHY;  Surgeon: Nigel Mormon, MD;  Location: [facility];  Service: Cardiovascular;  Laterality: N/A;  . LIGATION OF ARTERIOVENOUS  FISTULA Left 07/17/1094   Procedure: Plication of Left Arm Arteriovenous Fistula;  Surgeon: Elam Dutch, MD;  Location: [facility];  Service: Vascular;  Laterality: Left;  . POLYPECTOMY    . REVISON OF ARTERIOVENOUS FISTULA Left 0/45/4098   Procedure: PLICATION OF DISTAL ANEURYSMAL SEGEMENT OF LEFT UPPER ARM ARTERIOVENOUS FISTULA;  Surgeon: Elam Dutch, MD;  Location: [place];  Service: Vascular;   Laterality: Left;  . REVISON OF ARTERIOVENOUS FISTULA Left 07/30/1476   Procedure: Plication of Left Upper Arm Fistula ;  Surgeon: Waynetta Sandy, MD;  Location: [facility];  Service: Vascular;  Laterality: Left;  . SKIN GRAFT SPLIT THICKNESS LEG / FOOT Left    SKIN GRAFT SPLIT THICKNESS LEFT ARM DONOR SITE: LEFT ANTERIOR THIGH  . SKIN SPLIT GRAFT Left 07/04/2017   Procedure: SKIN GRAFT SPLIT THICKNESS LEFT ARM DONOR SITE: LEFT ANTERIOR THIGH;  Surgeon: Elam Dutch, MD;  Location: [facility];  Service: Vascular;  Laterality: Left;  . THROMBECTOMY W/ EMBOLECTOMY Left 06/05/2017   Procedure: EXPLORATION OF LEFT ARM FOR BLEEDING; OVERSEWED PROXIMAL FISTULA;  Surgeon: Angelia Mould, MD;  Location: [facility];  Service: Vascular;  Laterality: Left;  . WOUND EXPLORATION Left 06/03/2017   Procedure: WOUND EXPLORATION WITH WOUND VAC APPLICATION TO LEFT ARM;  Surgeon: Angelia Mould, MD;  [facility];  Service: Vascular;  Laterality: Left;   Family History  Family History  Problem Relation Age of Onset  . Heart disease Mother   . Lung cancer Mother   . Heart disease Father   . Malignant hyperthermia Father   . COPD Father   . Throat cancer Sister   . Esophageal cancer Sister   . Hypertension Other   . COPD Other   . Colon cancer Neg Hx   . Colon polyps Neg Hx   . Rectal cancer Neg Hx   . Stomach cancer Neg Hx    Social History  reports that he has been smoking cigarettes.  He started smoking about 43 years ago. He has a 21.50 pack-year smoking history. he has never used smokeless tobacco. He reports that he drinks alcohol. He reports that he uses drugs. Drugs: Marijuana and Cocaine. Allergies  Allergies  Allergen Reactions  . Aspirin Other (See Comments)    STOMACH PAIN  . Clonidine Derivatives Itching  . Tramadol Itching   Home medications Prior to Admission medications   Medication Sig Start Date End Date Taking? Authorizing Provider  acetaminophen (TYLENOL) 325  MG tablet Take 2 tablets (650 mg total) by mouth every 6 (six) hours as needed for mild pain (or Fever >/= 101). 06/23/17  Yes Shahmehdi, Seyed A, MD  budesonide-formoterol (SYMBICORT) 160-4.5 MCG/ACT inhaler Inhale 2 puffs into the lungs every 12 (twelve) hours.   Yes [provider]  calcitRIOL (ROCALTROL) 0.25 MCG capsule Take 3 capsules (0.75 mcg total) by mouth every Monday, Wednesday, and Friday with hemodialysis. 03/28/17  Yes Cherene Altes, MD  diltiazem (CARDIZEM CD) 120 MG 24 hr capsule Take 1 capsule (120 mg total) by mouth daily. 06/23/17 06/23/18 Yes Shahmehdi, Seyed A, MD  gabapentin (NEURONTIN) 100 MG capsule  Take 100 mg 2 (two) times daily by mouth.   Yes [provider]  isosorbide mononitrate (IMDUR) 30 MG 24 hr tablet Take 1 tablet (30 mg total) by mouth daily. 06/17/17  Yes [name], MD  Melatonin 5 MG TABS Take 5 mg by mouth at bedtime as needed (sleep).   Yes [provider]  minoxidil (LONITEN) 2.5 MG tablet Take 1 tablet (2.5 mg total) by mouth 2 (two) times daily. 06/23/17 07/23/17 Yes Shahmehdi, Seyed A, MD  nicotine (NICODERM CQ - DOSED IN MG/24 HOURS) 14 mg/24hr patch Place 14 mg daily onto the skin. 03/30/17  Yes [provider]  sevelamer carbonate (RENVELA) 800 MG tablet Take 4 tablets (3,200 mg total) by mouth 3 (three) times daily with meals. 06/10/17  Yes Mariel Aloe, MD  zolpidem (AMBIEN) 10 MG tablet Take 1 tablet (10 mg total) by mouth at bedtime as needed for sleep. 06/10/17  Yes Mariel Aloe, MD  multivitamin (RENA-VIT) TABS tablet Take 1 tablet by mouth at bedtime. 06/17/17   [name], MD  senna (SENOKOT) 8.6 MG TABS tablet Take 1 tablet (8.6 mg total) by mouth daily as needed for mild constipation. 06/10/17   Mariel Aloe, MD  Spacer/Aero-Holding Chambers (AEROCHAMBER MV) inhaler Use as instructed 03/10/17   Javier Glazier, MD  vitamin C (VITAMIN C) 500 MG tablet Take 1 tablet (500 mg total) by mouth 2 (two)  times daily. 06/17/17   [name], MD   Liver Function Tests Recent Labs  Lab 07/06/17 [phone]  ALBUMIN 3.1*   No results for input(s): LIPASE, AMYLASE in the last 168 hours. CBC Recent Labs  Lab 07/04/17 0827 07/04/17 1757  WBC  --  8.1  HGB 8.8* 8.3*  HCT 26.0* 26.1*  MCV  --  86.7  PLT  --  295   Basic Metabolic Panel Recent Labs  Lab 07/04/17 0827 07/04/17 1757 07/05/17 0233 07/06/17 0738  NA 136  --  135 134*  K 4.4  --  5.2* 5.9*  CL  --   --  92* 92*  CO2  --   --  31 29  GLUCOSE 100*  --  104* 97  BUN  --   --  25* 37*  CREATININE  --  6.26* 6.86* 9.26*  CALCIUM  --   --  9.1 9.4  PHOS  --   --   --  5.2*   Iron/TIBC/Ferritin/ %Sat    Component Value Date/Time   IRON 115 06/20/2017 2335   TIBC 318 06/20/2017 2335   FERRITIN 550 (H) 06/25/2011 0837   IRONPCTSAT 36 06/20/2017 2335    Vitals:   07/06/17 0817 07/06/17 0822 07/06/17 0900 07/06/17 0930  BP: 130/80 132/80 136/85 114/67  Pulse: (!) 120 (!) 106 (!) 127 68  Resp:  16    Temp:  98.4 F (36.9 C)    TempSrc:      SpO2:  97%    Weight:  75.2 kg (165 lb 12.6 oz)    Height:       Exam Gen alert, calm, no distress No rash, cyanosis or gangrene Sclera anicteric, throat clear  No jvd or bruits Chest clear bilat RRR no MRG Abd soft ntnd no mass or ascites +bs GU normal male MS no joint effusions or deformity Ext no LE or UE edema; LUA wrapped, L thigh wrapped  Neuro is alert, Ox 3 , nf    Dialysis: M[facility] [place] 4h   2/2  Hep 3000   R IJ TDC   (last edw 72.4) - get updated records -home BP Rx > cardizem 120 / minoxidil 2.5 bid   Impression: 1. Skin necrosis LUA - sp STSG on 12/24. Per VVS. 2. ESRD - cont HD MWF, using R IJ TDC. Mild hyperkalemia.  3. COPD 4. Anemia of CKD - Hb 8's, get records 5. MBD of CKD - get records 6. HTN - good BP on home meds  (cardizem 120/ low dose minox)   Plan - HD today, UF 2 L  [name] MD [facility] pager [phone]    07/06/2017, 9:35 AM

## 2017-07-06 NOTE — Procedures (Signed)
   I was present at this dialysis session, have reviewed the session itself and made  appropriate changes [name] MD [facility] pager [phone]   07/06/2017, 9:45 AM

## 2017-07-07 ENCOUNTER — Telehealth: Payer: Self-pay | Admitting: Vascular Surgery

## 2017-07-07 ENCOUNTER — Telehealth: Payer: Self-pay | Admitting: Internal Medicine

## 2017-07-07 MED ORDER — OXYCODONE HCL 5 MG PO TABS: 5.0000 mg | ORAL_TABLET | Freq: Three times a day (TID) | ORAL | 0 refills | Status: DC | PRN

## 2017-07-07 NOTE — Progress Notes (Signed)
[facility] Progress Note   Subjective: Seen in room. Pain controlled. Denies CP or dyspnea, although seems a little out of breath with talking. Tells me he is being discharged today?     Objective Vitals:   07/06/17 2033 07/07/17 0041 07/07/17 0307 07/07/17 0821  BP:  100/66  107/69  Pulse:  87  96  Resp:  18  18  Temp:  (!) 100.6 F (38.1 C) 99.3 F (37.4 C) 98.5 F (36.9 C)  TempSrc:   Axillary Axillary  SpO2: 95% 94%  92%  Weight:      Height:       Physical Exam General: Well appearing man, NAD. Facial edema present. Heart: RRR; no murmur Lungs: CTA anteriorly Extremities: No LE edema, L thigh with bloody xeroform present (skin graft donor site). Bandaged LUE (grafting site). Dialysis Access: [facility] in chest.  Additional Objective Labs: Basic Metabolic Panel: Recent Labs  Lab 07/04/17 0827 07/04/17 1757 07/05/17 0233 07/06/17 0738  NA 136  --  135 134*  K 4.4  --  5.2* 5.9*  CL  --   --  92* 92*  CO2  --   --  31 29  GLUCOSE 100*  --  104* 97  BUN  --   --  25* 37*  CREATININE  --  6.26* 6.86* 9.26*  CALCIUM  --   --  9.1 9.4  PHOS  --   --   --  5.2*   Liver Function Tests: Recent Labs  Lab 07/06/17 0738  ALBUMIN 3.1*   CBC: Recent Labs  Lab 07/04/17 0827 07/04/17 1757  WBC  --  8.1  HGB 8.8* 8.3*  HCT 26.0* 26.1*  MCV  --  86.7  PLT  --  162   Studies/Results: No results found. Medications: . sodium chloride 10 mL/hr at 07/05/17 1547  . sodium chloride     . calcitRIOL  0.75 mcg Oral Q M,W,F-HD  . diltiazem  120 mg Oral Daily  . docusate sodium  100 mg Oral BID  . gabapentin  100 mg Oral BID  . heparin  5,000 Units Subcutaneous Q8H  . isosorbide mononitrate  30 mg Oral Daily  . minoxidil  2.5 mg Oral BID  . mometasone-formoterol  2 puff Inhalation BID  . multivitamin  1 tablet Oral QHS  . nicotine  14 mg Transdermal Daily  . pantoprazole  40 mg Oral Daily  . sevelamer carbonate  3,200 mg Oral TID WC  . sodium  chloride flush  3 mL Intravenous Q12H  . ascorbic acid  500 mg Oral BID   Dialysis Orders: MWF at [place], 2K/2Ca bath, TDC, BFR 400, DFR 800, EDW 73kg, heparin 3000 - Mircera 266mcg IV q 2 weeks (last 12/19) - Cinacalcet 120mg  TIW  Assessment/Plan: 1. LUE skin necrosis following prior AVF infection: Now s/p skin gaft on 12/24. Per [facility]VS. 2. ESRD: Continue HD per MWF schedule, next 12/28. No heparin for now. 3. HTN/volume: BP controlled, facial edema present on exam. Will lower EDW as tolerated (can be done as outpatient) 4. Anemia: Hgb 8.3, not due for ESA yet. 5. Secondary hyperparathyroidism: Corr Ca/Phos ok. Continue Renvela, resume Sensipar w/ HD as outpt. 6. HBV: Per primary. 7. CAD: Per primary.  Veneta Penton, PA-C 07/07/2017, 11:20 AM  [facility] Pager: [phone]  Pt seen, examined and agree w A/P as above.  [name] MD [employer] pager [phone]   07/07/2017, 11:49 AM

## 2017-07-07 NOTE — Progress Notes (Signed)
Patient refused ALL 2200 medications stating "I dont want no medicine" "I just want to get out of here and go home". Will continue to monitor patient

## 2017-07-07 NOTE — Progress Notes (Signed)
[facility]  Subjective  - feels ok   Objective 107/69 96 98.5 F (36.9 C) (Axillary) 18 92%  Intake/Output Summary (Last 24 hours) at 07/07/2017 3382 Last data filed at 07/07/2017 5053 Gross per 24 hour  Intake 668.17 ml  Output 2000 ml  Net -1331.83 ml   Left leg donor site drying out Left arm stsg nearly 100% take  Assessment/Planning: S/p stsg left arm Home today Keep xeroform left leg allow to dry Left arm needs daily xeroform and bacitracin to avoid sticking Will need home health Follow up 2 weeks  Ruta Hinds 07/07/2017 9:38 AM --  Laboratory Lab Results: Recent Labs    07/04/17 1757  WBC 8.1  HGB 8.3*  HCT 26.1*  PLT 162   BMET Recent Labs    07/05/17 0233 07/06/17 0738  NA 135 134*  K 5.2* 5.9*  CL 92* 92*  CO2 31 29  GLUCOSE 104* 97  BUN 25* 37*  CREATININE 6.86* 9.26*  CALCIUM 9.1 9.4    COAG Lab Results  Component Value Date   INR 1.20 06/14/2017   INR 1.20 06/04/2017   INR 1.17 06/03/2017   No results found for: PTT

## 2017-07-07 NOTE — Telephone Encounter (Signed)
-----   Message from Mena Goes, RN sent at 07/07/2017  8:40 AM EST ----- Regarding: 2 weeks   ----- Message ----- From: Gabriel Earing, PA-C Sent: 07/07/2017   8:16 AM To: Vvs Charge Pool  S/p split thickness skin graft LUA.  F/u with [facility] in 2 weeks.  Thanks.

## 2017-07-07 NOTE — Telephone Encounter (Signed)
Spoke to pt for appt 1/17 mailed lttr to home address 07/07/17 bg

## 2017-07-07 NOTE — Telephone Encounter (Signed)
PA initiated through [facility] for proventil HFA 108 mcg/act. Reference number is [id number]. They will let us know of a decision in 24-72 hours.

## 2017-07-08 ENCOUNTER — Inpatient Hospital Stay (HOSPITAL_COMMUNITY): Payer: Medicare Other

## 2017-07-08 ENCOUNTER — Encounter (HOSPITAL_COMMUNITY): Payer: Self-pay | Admitting: Radiology

## 2017-07-08 DIAGNOSIS — N186 End stage renal disease: Secondary | ICD-10-CM

## 2017-07-08 DIAGNOSIS — R0602 Shortness of breath: Secondary | ICD-10-CM

## 2017-07-08 DIAGNOSIS — R0603 Acute respiratory distress: Secondary | ICD-10-CM

## 2017-07-08 LAB — BLOOD GAS, ARTERIAL
Acid-Base Excess: 3.2 mmol/L — ABNORMAL HIGH (ref 0.0–2.0)
Bicarbonate: 26.5 mmol/L (ref 20.0–28.0)
Drawn by: 44898
O2 Content: 6 L/min
O2 Saturation: 91.6 %
Patient temperature: 103.3
pCO2 arterial: 40 mmHg (ref 32.0–48.0)
pH, Arterial: 7.449 (ref 7.350–7.450)
pO2, Arterial: 72.4 mmHg — ABNORMAL LOW (ref 83.0–108.0)

## 2017-07-08 LAB — POCT I-STAT 3, ART BLOOD GAS (G3+)
Acid-Base Excess: 4 mmol/L — ABNORMAL HIGH (ref 0.0–2.0)
Bicarbonate: 29.1 mmol/L — ABNORMAL HIGH (ref 20.0–28.0)
O2 Saturation: 100 %
Patient temperature: 98.6
TCO2: 30 mmol/L (ref 22–32)
pCO2 arterial: 43.1 mmHg (ref 32.0–48.0)
pH, Arterial: 7.437 (ref 7.350–7.450)
pO2, Arterial: 439 mmHg — ABNORMAL HIGH (ref 83.0–108.0)

## 2017-07-08 LAB — CBC
HCT: 22.5 % — ABNORMAL LOW (ref 39.0–52.0)
HCT: 23.2 % — ABNORMAL LOW (ref 39.0–52.0)
Hemoglobin: 7.2 g/dL — ABNORMAL LOW (ref 13.0–17.0)
Hemoglobin: 7.6 g/dL — ABNORMAL LOW (ref 13.0–17.0)
MCH: 26.4 pg (ref 26.0–34.0)
MCH: 27.4 pg (ref 26.0–34.0)
MCHC: 32 g/dL (ref 30.0–36.0)
MCHC: 32.8 g/dL (ref 30.0–36.0)
MCV: 82.4 fL (ref 78.0–100.0)
MCV: 83.8 fL (ref 78.0–100.0)
Platelets: 119 10*3/uL — ABNORMAL LOW (ref 150–400)
Platelets: 121 10*3/uL — ABNORMAL LOW (ref 150–400)
RBC: 2.73 MIL/uL — ABNORMAL LOW (ref 4.22–5.81)
RBC: 2.77 MIL/uL — ABNORMAL LOW (ref 4.22–5.81)
RDW: 19.3 % — ABNORMAL HIGH (ref 11.5–15.5)
RDW: 19.9 % — ABNORMAL HIGH (ref 11.5–15.5)
WBC: 10.5 10*3/uL (ref 4.0–10.5)
WBC: 10.8 10*3/uL — ABNORMAL HIGH (ref 4.0–10.5)

## 2017-07-08 LAB — BASIC METABOLIC PANEL
Anion gap: 13 (ref 5–15)
BUN: 23 mg/dL — ABNORMAL HIGH (ref 6–20)
CO2: 26 mmol/L (ref 22–32)
Calcium: 9.6 mg/dL (ref 8.9–10.3)
Chloride: 95 mmol/L — ABNORMAL LOW (ref 101–111)
Creatinine, Ser: 5.77 mg/dL — ABNORMAL HIGH (ref 0.61–1.24)
GFR calc Af Amer: 12 mL/min — ABNORMAL LOW (ref >60)
GFR calc non Af Amer: 10 mL/min — ABNORMAL LOW (ref >60)
Glucose, Bld: 97 mg/dL (ref 65–99)
Potassium: 4.7 mmol/L (ref 3.5–5.1)
Sodium: 134 mmol/L — ABNORMAL LOW (ref 135–145)

## 2017-07-08 LAB — RENAL FUNCTION PANEL
Albumin: 2.9 g/dL — ABNORMAL LOW (ref 3.5–5.0)
Anion gap: 10 (ref 5–15)
BUN: 28 mg/dL — ABNORMAL HIGH (ref 6–20)
CO2: 28 mmol/L (ref 22–32)
Calcium: 9 mg/dL (ref 8.9–10.3)
Chloride: 94 mmol/L — ABNORMAL LOW (ref 101–111)
Creatinine, Ser: 6.67 mg/dL — ABNORMAL HIGH (ref 0.61–1.24)
GFR calc Af Amer: 10 mL/min — ABNORMAL LOW (ref >60)
GFR calc non Af Amer: 8 mL/min — ABNORMAL LOW (ref >60)
Glucose, Bld: 104 mg/dL — ABNORMAL HIGH (ref 65–99)
Phosphorus: 3.6 mg/dL (ref 2.5–4.6)
Potassium: 4.3 mmol/L (ref 3.5–5.1)
Sodium: 132 mmol/L — ABNORMAL LOW (ref 135–145)

## 2017-07-08 LAB — D-DIMER, QUANTITATIVE: D-Dimer, Quant: >20 ug/mL-FEU — ABNORMAL HIGH (ref 0.00–0.50)

## 2017-07-08 LAB — BRAIN NATRIURETIC PEPTIDE: B Natriuretic Peptide: 1100.4 pg/mL — ABNORMAL HIGH (ref 0.0–100.0)

## 2017-07-08 MED ORDER — IOPAMIDOL (ISOVUE-370) INJECTION 76%
INTRAVENOUS | Status: AC
  Administered 2017-07-08: 100 mL
  Filled 2017-07-08: qty 100

## 2017-07-08 MED ORDER — PENTAFLUOROPROP-TETRAFLUOROETH EX AERO: 1.0000 "application " | INHALATION_SPRAY | CUTANEOUS | Status: DC | PRN

## 2017-07-08 MED ORDER — ALTEPLASE 2 MG IJ SOLR: 2.0000 mg | Freq: Once | INTRAMUSCULAR | Status: DC | PRN

## 2017-07-08 MED ORDER — HEPARIN SODIUM (PORCINE) 1000 UNIT/ML DIALYSIS
1000.0000 [IU] | INTRAMUSCULAR | Status: DC | PRN
  Filled 2017-07-08: qty 1

## 2017-07-08 MED ORDER — MIDAZOLAM HCL 2 MG/2ML IJ SOLN
2.0000 mg | INTRAMUSCULAR | Status: DC | PRN
  Administered 2017-07-08 – 2017-07-10 (×5): 2 mg via INTRAVENOUS
  Filled 2017-07-08 (×5): qty 2

## 2017-07-08 MED ORDER — MIDAZOLAM HCL 2 MG/2ML IJ SOLN
2.0000 mg | INTRAMUSCULAR | Status: DC | PRN
  Administered 2017-07-08 – 2017-07-09 (×2): 2 mg via INTRAVENOUS
  Filled 2017-07-08 (×5): qty 2

## 2017-07-08 MED ORDER — VANCOMYCIN HCL 10 G IV SOLR
1500.0000 mg | Freq: Once | INTRAVENOUS | Status: AC
  Administered 2017-07-08: 1500 mg via INTRAVENOUS
  Filled 2017-07-08: qty 1500

## 2017-07-08 MED ORDER — SODIUM CHLORIDE 0.9 % IV SOLN: 100.0000 mL | INTRAVENOUS | Status: DC | PRN

## 2017-07-08 MED ORDER — SODIUM CHLORIDE 0.9 % IV SOLN
25.0000 ug/h | INTRAVENOUS | Status: DC
  Administered 2017-07-08: 200 ug/h via INTRAVENOUS
  Administered 2017-07-09: 250 ug/h via INTRAVENOUS
  Administered 2017-07-09: 25 ug/h via INTRAVENOUS
  Administered 2017-07-10: 250 ug/h via INTRAVENOUS
  Filled 2017-07-08 (×4): qty 50

## 2017-07-08 MED ORDER — CHLORHEXIDINE GLUCONATE 0.12% ORAL RINSE (MEDLINE KIT)
15.0000 mL | Freq: Two times a day (BID) | OROMUCOSAL | Status: DC
  Administered 2017-07-08: 15 mL via OROMUCOSAL

## 2017-07-08 MED ORDER — BACITRACIN ZINC 500 UNIT/GM EX OINT
TOPICAL_OINTMENT | CUTANEOUS | Status: AC
  Filled 2017-07-08: qty 56.7

## 2017-07-08 MED ORDER — FENTANYL BOLUS VIA INFUSION
50.0000 ug | INTRAVENOUS | Status: DC | PRN
  Administered 2017-07-08: 50 ug via INTRAVENOUS
  Filled 2017-07-08: qty 50

## 2017-07-08 MED ORDER — VANCOMYCIN HCL IN DEXTROSE 750-5 MG/150ML-% IV SOLN: 750.0000 mg | INTRAVENOUS | Status: DC

## 2017-07-08 MED ORDER — FENTANYL CITRATE (PF) 100 MCG/2ML IJ SOLN
INTRAMUSCULAR | Status: AC
  Administered 2017-07-08: 100 ug
  Filled 2017-07-08: qty 4

## 2017-07-08 MED ORDER — ORAL CARE MOUTH RINSE
15.0000 mL | Freq: Four times a day (QID) | OROMUCOSAL | Status: DC
  Administered 2017-07-09: 15 mL via OROMUCOSAL

## 2017-07-08 MED ORDER — MORPHINE SULFATE (PF) 4 MG/ML IV SOLN
2.0000 mg | INTRAVENOUS | Status: DC | PRN
  Administered 2017-07-08: 4 mg via INTRAVENOUS
  Filled 2017-07-08: qty 1

## 2017-07-08 MED ORDER — DOCUSATE SODIUM 50 MG/5ML PO LIQD
100.0000 mg | Freq: Two times a day (BID) | ORAL | Status: DC | PRN
  Administered 2017-07-09: 100 mg

## 2017-07-08 MED ORDER — BACITRACIN ZINC 500 UNIT/GM EX OINT
TOPICAL_OINTMENT | CUTANEOUS | Status: AC
  Filled 2017-07-08: qty 85.05

## 2017-07-08 MED ORDER — PIPERACILLIN-TAZOBACTAM 3.375 G IVPB
3.3750 g | Freq: Two times a day (BID) | INTRAVENOUS | Status: DC
  Administered 2017-07-09 (×3): 3.375 g via INTRAVENOUS
  Filled 2017-07-08 (×4): qty 50

## 2017-07-08 MED ORDER — LIDOCAINE HCL (PF) 1 % IJ SOLN: 5.0000 mL | INTRAMUSCULAR | Status: DC | PRN

## 2017-07-08 MED ORDER — FENTANYL CITRATE (PF) 100 MCG/2ML IJ SOLN
50.0000 ug | Freq: Once | INTRAMUSCULAR | Status: AC
  Administered 2017-07-08: 50 ug via INTRAVENOUS

## 2017-07-08 MED ORDER — MIDAZOLAM HCL 2 MG/2ML IJ SOLN
INTRAMUSCULAR | Status: AC
  Administered 2017-07-08: 2 mg
  Filled 2017-07-08: qty 4

## 2017-07-08 MED ORDER — LIDOCAINE-PRILOCAINE 2.5-2.5 % EX CREA: 1.0000 "application " | TOPICAL_CREAM | CUTANEOUS | Status: DC | PRN

## 2017-07-08 NOTE — Care Management Important Message (Signed)
Important Message  Patient Details  Name: [name] MRN: [id number] Date of Birth: [birth year]   Medicare Important Message Given:  Yes    Nathen May 07/08/2017, 9:40 AM

## 2017-07-08 NOTE — Progress Notes (Signed)
2029:  Dr. Justin Mend (nephro) notified of CTA ordered.  No orders received.  2053:  To CT via bed for CTA.  2150Warren Lacy MD, Deter[place] MD, notified of pt increasing SOB, RR, and confusion on 100% NRB.  Spo2 100%.  Orders received.  RT notified of impending intubation.  2224:  Dr. Gilford Raid at bedside.  Intubation complete.  Bilateral breath sounds auscultated.  CXR ordered and pending.  Will continue to monitor closely.

## 2017-07-08 NOTE — Telephone Encounter (Signed)
Called [employer] to inquire about the covered alternative for Proventil >> is it Ventolin or ProAir? Spoke with representative Butch Penny >> Ventolin HFA 115mcg/act is the covered alternative Informed Butch Penny that the PA can be cancelled and we will change pt to the covered alternative  LMOM TCB x1 for pt to discuss Because message did not originate in triage, will route to [place] per office protocol

## 2017-07-08 NOTE — Progress Notes (Signed)
Pharmacy Antibiotic Note  [name] is a 56 y.o. male with LUE skin necrosis with prior AVF infection. Pt transferred to ICU due to increasing SOB. Starting empiric abx for fever with unknown source at this time, possibly TDC-related bloodstream infection vs pneumonia. Pt is ESRD on HD - MWF.    Plan: -Zosyn 3.375 g IV q12h -Vancomycin 1500 mg IV x1 then 750 mg IV qHD-MWF -Monitor HD schedule/tolerance, cultures, VR as needed   Height: 5\' 9"  (175.3 cm) Weight: 161 lb 2.5 oz (73.1 kg) IBW/kg (Calculated) : 70.7  Temp (24hrs), Avg:99.2 F (37.3 C), Min:98 F (36.7 C), Max:103.2 F (39.6 C)  Recent Labs  Lab 07/04/17 1757 07/05/17 0233 07/06/17 0738 07/08/17 1352  WBC 8.1  --   --  10.5  CREATININE 6.26* 6.86* 9.26* 6.67*    Estimated Creatinine Clearance: 12.7 mL/min (A) (by C-G formula based on SCr of 6.67 mg/dL (H)).    Allergies  Allergen Reactions  . Aspirin Other (See Comments)    STOMACH PAIN  . Clonidine Derivatives Itching  . Tramadol Itching    Antimicrobials this admission: 12/28 vancomycin > 12/28 zosyn >  Dose adjustments this admission: N/A  Microbiology results: 12/28 blood cx:  Harvel Quale 07/08/2017 7:37 PM

## 2017-07-08 NOTE — Progress Notes (Signed)
Seen in HD unit after dialysis, already disconnected from dialyzer.  Coughing and seems to be having increased work of breathing. Lungs with coarse rhonchi throughout. O2 sats 97% with 3L [place].  - Ordering chest xray. May need extra dialysis treatment tomorrow.    Lynnda Child PA-C [facility] Pager [phone] 07/08/2017,5:10 PM

## 2017-07-08 NOTE — Progress Notes (Signed)
17:50- Patient arrived back from dialysis continuously coughing, labored breathing, disoriented, resisting assessment, lungs clear in lower lobes with rhonchi upper,  BP 162/83, HR 180, RR 48 o2SAT 95 on 4L [place], rectal tempeture 103.90F.   Paged Dr. Oneida Alar and called rapid response.   Chest x ray obtained per report from dialysis RN.   Called providers office to make aware   19:00- Provider saw patient bedside.   19:13- jugular vein distention reported to dr Bridgett Larsson   19:20- report called to 2 heart and patient transferred by charge nurses.

## 2017-07-08 NOTE — Progress Notes (Signed)
  [facility] Progress Note   Subjective: seems a bit confused.   Objective Vitals:   07/08/17 0824 07/08/17 0837 07/08/17 0847 07/08/17 1157  BP: 103/71 113/68  109/80  Pulse:      Resp: (!) 24   18  Temp: 98.1 F (36.7 C)     TempSrc: Oral     SpO2: 97%  95% 100%  Weight:      Height:       Physical Exam General: Well appearing man, NAD. No edema Heart: RRR; no murmur Lungs: CTA anteriorly Extremities: No LE edema, L thigh with bloody xeroform present (skin graft donor site). Bandaged LUE (grafting site). Dialysis Access: [facility] in chest.  Additional Objective Labs: Basic Metabolic Panel: Recent Labs  Lab 07/04/17 0827 07/04/17 1757 07/05/17 0233 07/06/17 0738  NA 136  --  135 134*  K 4.4  --  5.2* 5.9*  CL  --   --  92* 92*  CO2  --   --  31 29  GLUCOSE 100*  --  104* 97  BUN  --   --  25* 37*  CREATININE  --  6.26* 6.86* 9.26*  CALCIUM  --   --  9.1 9.4  PHOS  --   --   --  5.2*   Liver Function Tests: Recent Labs  Lab 07/06/17 0738  ALBUMIN 3.1*   CBC: Recent Labs  Lab 07/04/17 0827 07/04/17 1757  WBC  --  8.1  HGB 8.8* 8.3*  HCT 26.0* 26.1*  MCV  --  86.7  PLT  --  162   Studies/Results: No results found. Medications: . sodium chloride 10 mL/hr at 07/05/17 1547  . sodium chloride     . calcitRIOL  0.75 mcg Oral Q M,W,F-HD  . diltiazem  120 mg Oral Daily  . docusate sodium  100 mg Oral BID  . gabapentin  100 mg Oral BID  . heparin  5,000 Units Subcutaneous Q8H  . isosorbide mononitrate  30 mg Oral Daily  . minoxidil  2.5 mg Oral BID  . mometasone-formoterol  2 puff Inhalation BID  . multivitamin  1 tablet Oral QHS  . nicotine  14 mg Transdermal Daily  . pantoprazole  40 mg Oral Daily  . sevelamer carbonate  3,200 mg Oral TID WC  . sodium chloride flush  3 mL Intravenous Q12H  . ascorbic acid  500 mg Oral BID   Dialysis Orders: MWF at [facility] 4h  73kg   Hep 3000   400/800  2/2 bath  - Mircera 243mcg IV q 2 weeks (last  12/19) - Cinacalcet 120mg  TIW  Assessment/Plan: 1. LUE skin necrosis following prior AVF infiltration: Now s/p skin gaft on 12/24. Per VVS. 2. ESRD: HD MWF.  HD today.  3. HTN/volume: BP on the low side on home meds ( dilt 120 and minox 2.5 bid).  4. Anemia: Hgb 8.3, not due for ESA yet. 5. Secondary hyperparathyroidism: Corr Ca/Phos ok. Continue Renvela, resume Sensipar w/ HD as outpt. 6. HBV: Per primary. 7. CAD: Per primary.   Plan - HD today, UF as tol  [employer] pager [phone]   07/08/2017, 1:31 PM

## 2017-07-08 NOTE — Progress Notes (Signed)
Pt had coughing at last 30 mins on hemodialysis treatment. O2 3l given, O2 sat 97%.Larina Earthly AP notified.Order received as follow: Chest x ray stat, may needs extra HD treatment tomorrow. Report given to Ward Sam RN.

## 2017-07-08 NOTE — Progress Notes (Signed)
[place] Mc[place], pt's preacher, notified of patients status.  [place] is the only person on pt's chart to be emergency contact.  Ms. Glenford Peers stated that she will try and contact his sisters to notify them of his status.

## 2017-07-08 NOTE — Progress Notes (Addendum)
   Daily Progress Note  By report pt returned from HD had increasing SOB with vigorous coughing.  Rapid response was called with rapid escalation in [place] to 6 L/Hr and RR up to 30-40s. Tmax >103.  Pt has been tachycardiac during this time period.  Pt was already given a respiratory treatment during rapid response.  On exam, greatly increased WOB with accessory muscle use, RR 30-40, NRB, HR 120.  Lung fields with good air movt and limited rales without rhonchi.  PCXR: official read not available.  No obvious pulmonary edema or infiltrate.  No PTX   ABG    Component Value Date/Time   PHART 7.449 07/08/2017 1817   PCO2ART 40.0 07/08/2017 1817   PO2ART 72.4 (L) 07/08/2017 1817   HCO3 26.5 07/08/2017 1817   TCO2 30 06/18/2017 0934   ACIDBASEDEF 2.7 (H) 04/24/2017 0745   O2SAT 91.6 07/08/2017 1817     Acute respiratory distress of unknown etiology: pCXR not impressive for fluid overload or infiltrate, -3 L HD run prior to event also not consistent with such  Transfer to ICU (reported 2H bed)  PCCM called to assist with respiratory mgmt  BMP, CBC, BNP, D-dimer, Blood cultures  Stat CTA chest for PE  Likely will fatigue at current rate of breath and will need to be intubated due to increased work of breathing  Dose of abx: vancomycin, Zosyn     Adele Barthel, MD, FACS [facility]: [phone] Pager: [phone]  07/08/2017, 7:23 PM

## 2017-07-08 NOTE — Progress Notes (Signed)
[facility]  Subjective  - feels ok   Objective 109/80 (!) 124 98.1 F (36.7 C) (Oral) 18 100%  Intake/Output Summary (Last 24 hours) at 07/08/2017 1348 Last data filed at 07/08/2017 4715 Gross per 24 hour  Intake 360 ml  Output -  Net 360 ml   Left arm skin graft still looks good Left leg donor site clean drying out  Assessment/Planning: Pt will need to stay inpatient a few more days since we are unable to get home health daily dressing changes   [name] 07/08/2017 1:48 PM --  Laboratory Lab Results: No results for input(s): WBC, HGB, HCT, PLT in the last 72 hours. BMET Recent Labs    07/06/17 0738  NA 134*  K 5.9*  CL 92*  CO2 29  GLUCOSE 97  BUN 37*  CREATININE 9.26*  CALCIUM 9.4    COAG Lab Results  Component Value Date   INR 1.20 06/14/2017   INR 1.20 06/04/2017   INR 1.17 06/03/2017   No results found for: PTT

## 2017-07-08 NOTE — Telephone Encounter (Signed)
[employer] calling back in regards to PA for Proventil HFA 161mcg/act. Needing a reason why this Rx is clinically neccessary for this pt. Cb is [phone]. Reference number [id number].

## 2017-07-08 NOTE — Progress Notes (Signed)
Patient a little more confused than he was this morning, potassium was 5.9 this morning, called and he is next on the list for dialysis. Will continue to monitor.  Blood pressure 109/80, pulse (!) 124, temperature 98.1 F (36.7 C), temperature source Oral, resp. rate 18, height 5\' 9"  (1.753 m), weight 73.2 kg (161 lb 6 oz), SpO2 100 %.

## 2017-07-08 NOTE — Procedures (Signed)
Intubation Procedure Note Darryon Bastin [id number] [birth year]  Procedure: Intubation Indications: Respiratory insufficiency  Procedure Details Consent: Risks of procedure as well as the alternatives and risks of each were explained to the (patient/caregiver).  Consent for procedure obtained. Time Out: Verified patient identification, verified procedure, site/side was marked, verified correct patient position, special equipment/implants available, medications/allergies/relevent history reviewed, required imaging and test results available.  Performed  Maximum sterile technique was used including gloves and mask.  MAC and 4    Evaluation Hemodynamic Status: BP stable throughout; O2 sats: stable throughout Patient's Current Condition: stable Complications: No apparent complications Patient did tolerate procedure well. Chest X-ray ordered to verify placement.  CXR: pending.   Rise Paganini Lesley Atkin 07/08/2017

## 2017-07-08 NOTE — Consult Note (Signed)
.. ..  Name: [name] MRN: [id number] DOB: [birth year]    ADMISSION DATE:  07/04/2017 CONSULTATION DATE:  07/08/17  REFERRING MD :  Adele Barthel MD  CHIEF COMPLAINT:  Dyspnea and cough  BRIEF PATIENT DESCRIPTION: 56 yr old male w/ PMHx of ESRD on HD MWF received HD today PCCM consulted for increased work of breathing.   SIGNIFICANT EVENTS  CXR CTA pending  STUDIES:  CXR, ABG, repeat labs   HISTORY OF PRESENT ILLNESS:  56 yr old male with a PMHx of COPD, CAD, substance abuse (documented cocaine). HTN Cardiomegaly, ESRD on HD (vie RIJ catheter), Hep B&C, and Tubular adenoma of the colon presented on 12/24 for wound defect of the left arm secondary to skin necrosis and is s/p skin graft. Seen today post HD by Nephrologist who noted despite 3L removal that patient may need an additional HD session then by Vascular who noted an increased work of breathing   PAST MEDICAL HISTORY :   has a past medical history of Adenomatous colon polyp, Anemia, Anxiety, Arthritis, Atherosclerosis of aorta ([place]), Cardiomegaly, Chest pain, Cocaine abuse ([place]), COPD exacerbation ([place]) (08/17/2016), Coronary artery disease, Dialysis patient ([place]), ESRD (end stage renal disease) on dialysis ([place]), GERD (gastroesophageal reflux disease), Hemorrhoids, Hepatitis B, chronic ([place]), Hepatitis C, History of kidney stones, Hyperkalemia, Hypertension, Metabolic bone disease, Myocardial infarction ([place]), Pneumonia, Pulmonary edema, Renal disorder, Renal insufficiency, Shortness of breath dyspnea, and Tubular adenoma of colon.  has a past surgical history that includes AV fistula placement (2012); Hemorrhoid banding; Ligation of arteriovenous  fistula (Left, 12/14/2015); Revison of arteriovenous fistula (Left, 01/20/2016); Revison of arteriovenous fistula (Left, 07/24/2016); Colonoscopy; Polypectomy; LEFT HEART CATH AND CORONARY ANGIOGRAPHY (N/A, 02/22/2017); CORONARY STENT INTERVENTION (N/A, 02/22/2017); A/V Fistulagram  (Left, 05/26/2017); Insertion of dialysis catheter (05/30/2017); Insertion of dialysis catheter (N/A, 05/30/2017); I&D extremity (Left, 06/01/2017); Wound exploration (Left, 06/03/2017); Thrombectomy w/ embolectomy (Left, 06/05/2017); I&D extremity (Left, 06/14/2017); Application if wound vac (Left, 06/14/2017); Skin graft split thickness leg / foot (Left); and Skin split graft (Left, 07/04/2017). Prior to Admission medications   Medication Sig Start Date End Date Taking? Authorizing Provider  acetaminophen (TYLENOL) 325 MG tablet Take 2 tablets (650 mg total) by mouth every 6 (six) hours as needed for mild pain (or Fever >/= 101). 06/23/17  Yes Shahmehdi, Seyed A, MD  budesonide-formoterol (SYMBICORT) 160-4.5 MCG/ACT inhaler Inhale 2 puffs into the lungs every 12 (twelve) hours.   Yes [provider]  calcitRIOL (ROCALTROL) 0.25 MCG capsule Take 3 capsules (0.75 mcg total) by mouth every Monday, Wednesday, and Friday with hemodialysis. 03/28/17  Yes Cherene Altes, MD  diltiazem (CARDIZEM CD) 120 MG 24 hr capsule Take 1 capsule (120 mg total) by mouth daily. 06/23/17 06/23/18 Yes Shahmehdi, Seyed A, MD  gabapentin (NEURONTIN) 100 MG capsule Take 100 mg 2 (two) times daily by mouth.   Yes [provider]  isosorbide mononitrate (IMDUR) 30 MG 24 hr tablet Take 1 tablet (30 mg total) by mouth daily. 06/17/17  Yes [name], MD  Melatonin 5 MG TABS Take 5 mg by mouth at bedtime as needed (sleep).   Yes [provider]  minoxidil (LONITEN) 2.5 MG tablet Take 1 tablet (2.5 mg total) by mouth 2 (two) times daily. 06/23/17 07/23/17 Yes Shahmehdi, Seyed A, MD  nicotine (NICODERM CQ - DOSED IN MG/24 HOURS) 14 mg/24hr patch Place 14 mg daily onto the skin. 03/30/17  Yes [provider]  sevelamer carbonate (RENVELA) 800 MG tablet Take 4 tablets (  3,200 mg total) by mouth 3 (three) times daily with meals. 06/10/17  Yes Mariel Aloe, MD  zolpidem (AMBIEN) 10 MG tablet Take 1  tablet (10 mg total) by mouth at bedtime as needed for sleep. 06/10/17  Yes Mariel Aloe, MD  multivitamin (RENA-VIT) TABS tablet Take 1 tablet by mouth at bedtime. 06/17/17   [name], MD  oxyCODONE (ROXICODONE) 5 MG immediate release tablet Take 1 tablet (5 mg total) by mouth every 8 (eight) hours as needed. 07/07/17 07/07/18  Dagoberto Ligas, PA-C  senna (SENOKOT) 8.6 MG TABS tablet Take 1 tablet (8.6 mg total) by mouth daily as needed for mild constipation. 06/10/17   Mariel Aloe, MD  Spacer/Aero-Holding Chambers (AEROCHAMBER MV) inhaler Use as instructed 03/10/17   Javier Glazier, MD  vitamin C (VITAMIN C) 500 MG tablet Take 1 tablet (500 mg total) by mouth 2 (two) times daily. 06/17/17   [name], MD   Allergies  Allergen Reactions  . Aspirin Other (See Comments)    STOMACH PAIN  . Clonidine Derivatives Itching  . Tramadol Itching    FAMILY HISTORY:  family history includes COPD in his father and other; Esophageal cancer in his sister; Heart disease in his father and mother; Hypertension in his other; Lung cancer in his mother; Malignant hyperthermia in his father; Throat cancer in his sister. SOCIAL HISTORY:  reports that he has been smoking cigarettes.  He started smoking about 43 years ago. He has a 21.50 pack-year smoking history. he has never used smokeless tobacco. He reports that he drinks alcohol. He reports that he uses drugs. Drugs: Marijuana and Cocaine.  REVIEW OF SYSTEMS:  Bolded items are pertinent positives Constitutional: Negative for fever, chills, weight loss, malaise/fatigue and diaphoresis.  HENT: Negative for hearing loss, ear pain, nosebleeds, congestion, sore throat, neck pain, tinnitus and ear discharge.   Eyes: Negative for blurred vision, double vision, photophobia, pain, discharge and redness.  Respiratory: cough, hemoptysis, sputum production, shortness of breath, wheezing and stridor.   Cardiovascular: Negative for chest pain, palpitations,  orthopnea, claudication, leg swelling and PND.  Gastrointestinal: Negative for heartburn, nausea, vomiting, abdominal pain, diarrhea, constipation, blood in stool and melena.  Genitourinary: Negative for dysuria, urgency, frequency, hematuria and flank pain.  Musculoskeletal: Negative for myalgias, back pain, joint pain and falls.  Skin: Negative for itching and rash.  Neurological: Negative for dizziness, tingling, tremors, sensory change, speech change, focal weakness, seizures, loss of consciousness, weakness and headaches.  Endo/Heme/Allergies: Negative for environmental allergies and polydipsia. Does not bruise/bleed easily.  SUBJECTIVE:   VITAL SIGNS: Temp:  [98 F (36.7 C)-103.2 F (39.6 C)] 103.2 F (39.6 C) (12/28 1752) Pulse Rate:  [79-135] 79 (12/28 1900) Resp:  [17-48] 42 (12/28 1900) BP: (103-165)/(60-94) 165/94 (12/28 1900) SpO2:  [92 %-100 %] 95 % (12/28 1900) Weight:  [73.1 kg (161 lb 2.5 oz)] 73.1 kg (161 lb 2.5 oz) (12/28 1655)  PHYSICAL EXAMINATION: General: increased work of breathing mild distress Neuro:  AAOx3 no neurological deficits on exam HEENT:  Normocephalic atraumatic, midline trachea Cardiovascular:  S1 S2 appreciated irregular Lungs:  Some mild crackles at bases no wheezing on expiration Abdomen:  Soft not distended + BS Musculoskeletal:  LUE bandaged dry and intact, no other gross deformities. Catheter in RIJ noted Skin:  Grossly intact  Recent Labs  Lab 07/05/17 0233 07/06/17 0738 07/08/17 1352  NA 135 134* 132*  K 5.2* 5.9* 4.3  CL 92* 92* 94*  CO2 31 29 28  BUN 25* 37* 28*  CREATININE 6.86* 9.26* 6.67*  GLUCOSE 104* 97 104*   Recent Labs  Lab 07/04/17 0827 07/04/17 1757 07/08/17 1352  HGB 8.8* 8.3* 7.2*  HCT 26.0* 26.1* 22.5*  WBC  --  8.1 10.5  PLT  --  162 119*   Dg Chest Port 1 View  Result Date: 07/08/2017 CLINICAL DATA:  Dyspnea.  Cocaine abuse EXAM: PORTABLE CHEST 1 VIEW COMPARISON:  June 18, 2017 FINDINGS: The heart  size and mediastinal contours are stable. The heart size is enlarged. Dual lumen central venous line is identified unchanged. Mild increased pulmonary interstitium is identified bilaterally. There is no focal pneumonia or pleural effusion. The visualized skeletal structures are unremarkable. IMPRESSION: Cardiomegaly.  Mild interstitial edema. Electronically Signed   By: Abelardo Diesel M.D.   On: 07/08/2017 19:36    ASSESSMENT / PLAN: NEURO: No acute issues AAOx 3 able to answer questions appropiately no signs of encephalopathy Post intubation requiring sedation  Placed on ICU protocol Daily sedation vacation as tolerated  CARDIAC: Moderate LVH and elevated PAP approx 95 on last ECHO in Nov 2018 Currently in Afib most likely secondary to resp status Not requiring vasopressors at this time hemodynamically stable Pericardial effusion on CTA chest Features of RSHF present   PULMONARY: Acute respiratory insufficiency with hypoxia Multifocal PNA on CTA chest Despite Non invasive strategies work of breathing increased Pt required NRB and decision was made to intubate Currently on [facility] reviewed If O2 requirements increase consider ARDS protocol Daily wean protocol with SBT As tolerated  Elevated D- dimer no PE on CTA   ID: Multifocal PNA Cultures pending  RVP sent Continues on Vancomycin and Zosyn Recommend checking Vanc random as pt is ESRD on HD Trend WBC, fever curve, LA  Endocrine: Secondary Hyperparathyroidism - ESRD Metabolic bone disease  Monitor calcium and Phos levels And follow Renal recs BG control 140-180mg /dl is target  GI: NPO for now OGT placed Per Nutrition recs for initiation of TF PPI on board   Heme: Anemia of Chronic Disease Hgb<7 transfuse PRBCs .Jenetta Downer NEG Currently no signs of active bleeding or coagulopathy DVT PPx heparin [place] and SCDs  RENAL ESRD on HD May need another session on 12/29 Ultimately decision deferred to  Nephrology Baseline Cr .. Lab Results  Component Value Date   CREATININE 6.67 (H) 07/08/2017   CREATININE 9.26 (H) 07/06/2017   CREATININE 6.86 (H) 07/05/2017  Hyperkalemia with peaked T waves noted S/p full HD session Defer mgmt to Nephrology at this time    ..  I, Dr Seward Carol have personally reviewed patient's available data, including medical history, events of note, physical examination and test results as part of my evaluation. I have discussed with [place] physician and other care providers such as pharmacist, RN and RRT. The patient is critically ill with multiple organ systems failure and requires high complexity decision making for assessment and support, frequent evaluation and titration of therapies, application of advanced monitoring technologies and extensive interpretation of multiple databases. Critical Care Time devoted to patient care services described in this note is 60Minutes. This time reflects time of care of this signee Dr Seward Carol. This critical care time does not reflect procedure time, or teaching time or supervisory time but could involve care discussion time    DISPOSITION: ICU 2H CC TIME: 60 mins PROGNOSIS: Guarded FAMILY:not at bedside CODE STATUS: Full   Signed Dr Seward Carol Pulmonary Critical Care Locums Pulmonary and [place]  Pager: [phone]  07/08/2017, 7:54 PM

## 2017-07-08 NOTE — Progress Notes (Addendum)
CM received consult: Pt needs a daily HHRN for dressing change to left arm. He needs xeroform followed by bacitracin over the xeroform followed by 4x4s and a gentle ace daily. If he cannot get [facility] for dressing change every day, he cannot be discharged. No dressing changes to left leg donor site. CM has spoken with [facility] / Katrina regarding daily dressing change needs. Katrina is going to f/u with [name]/[facility] ( Vascular Specialty Rep.) who is familiar with [name] and see if agency could possible meet request of daily drsg changes. CM awaiting call back from [place]. Whitman Hero RN, BSN,CM

## 2017-07-09 DIAGNOSIS — J9601 Acute respiratory failure with hypoxia: Secondary | ICD-10-CM

## 2017-07-09 LAB — RESPIRATORY PANEL BY PCR
Adenovirus: NOT DETECTED
Bordetella pertussis: NOT DETECTED
Chlamydophila pneumoniae: NOT DETECTED
Coronavirus 229E: NOT DETECTED
Coronavirus HKU1: NOT DETECTED
Coronavirus NL63: NOT DETECTED
Coronavirus OC43: NOT DETECTED
Influenza A: NOT DETECTED
Influenza B: NOT DETECTED
Metapneumovirus: NOT DETECTED
Mycoplasma pneumoniae: NOT DETECTED
Parainfluenza Virus 1: NOT DETECTED
Parainfluenza Virus 2: NOT DETECTED
Parainfluenza Virus 3: NOT DETECTED
Parainfluenza Virus 4: NOT DETECTED
Respiratory Syncytial Virus: DETECTED — AB
Rhinovirus / Enterovirus: NOT DETECTED

## 2017-07-09 LAB — GLUCOSE, CAPILLARY
Glucose-Capillary: 87 mg/dL (ref 65–99)
Glucose-Capillary: 106 mg/dL — ABNORMAL HIGH (ref 65–99)
Glucose-Capillary: 121 mg/dL — ABNORMAL HIGH (ref 65–99)

## 2017-07-09 LAB — PHOSPHORUS
Phosphorus: 5.9 mg/dL — ABNORMAL HIGH (ref 2.5–4.6)
Phosphorus: 5.4 mg/dL — ABNORMAL HIGH (ref 2.5–4.6)

## 2017-07-09 LAB — MAGNESIUM
Magnesium: 2.2 mg/dL (ref 1.7–2.4)
Magnesium: 2.2 mg/dL (ref 1.7–2.4)

## 2017-07-09 LAB — PROCALCITONIN: Procalcitonin: 5.63 ng/mL

## 2017-07-09 LAB — VANCOMYCIN, RANDOM: Vancomycin Rm: 25

## 2017-07-09 MED ORDER — AMIODARONE HCL IN DEXTROSE 360-4.14 MG/200ML-% IV SOLN
30.0000 mg/h | INTRAVENOUS | Status: DC
  Administered 2017-07-10 (×3): 30 mg/h via INTRAVENOUS
  Filled 2017-07-09 (×5): qty 200

## 2017-07-09 MED ORDER — VITAL AF 1.2 CAL PO LIQD
1000.0000 mL | ORAL | Status: DC
  Administered 2017-07-09: 1000 mL

## 2017-07-09 MED ORDER — CHLORHEXIDINE GLUCONATE 0.12% ORAL RINSE (MEDLINE KIT)
15.0000 mL | Freq: Two times a day (BID) | OROMUCOSAL | Status: DC
  Administered 2017-07-09 – 2017-07-10 (×3): 15 mL via OROMUCOSAL

## 2017-07-09 MED ORDER — IPRATROPIUM-ALBUTEROL 0.5-2.5 (3) MG/3ML IN SOLN
3.0000 mL | RESPIRATORY_TRACT | Status: DC | PRN
  Administered 2017-07-10 (×2): 3 mL via RESPIRATORY_TRACT
  Filled 2017-07-09 (×2): qty 3

## 2017-07-09 MED ORDER — GABAPENTIN 250 MG/5ML PO SOLN
100.0000 mg | Freq: Two times a day (BID) | ORAL | Status: DC
  Filled 2017-07-09: qty 2

## 2017-07-09 MED ORDER — SODIUM CHLORIDE 0.9 % IV BOLUS (SEPSIS)
1000.0000 mL | Freq: Once | INTRAVENOUS | Status: AC
  Administered 2017-07-09: 1000 mL via INTRAVENOUS

## 2017-07-09 MED ORDER — DOCUSATE SODIUM 50 MG/5ML PO LIQD
100.0000 mg | Freq: Two times a day (BID) | ORAL | Status: DC
  Administered 2017-07-09 – 2017-07-15 (×5): 100 mg via ORAL
  Filled 2017-07-09 (×9): qty 10

## 2017-07-09 MED ORDER — BUDESONIDE 0.5 MG/2ML IN SUSP
0.5000 mg | Freq: Two times a day (BID) | RESPIRATORY_TRACT | Status: DC
Start: 2017-07-09 — End: 2017-07-15
  Administered 2017-07-09 – 2017-07-15 (×12): 0.5 mg via RESPIRATORY_TRACT
  Filled 2017-07-09 (×13): qty 2

## 2017-07-09 MED ORDER — PRO-STAT SUGAR FREE PO LIQD
30.0000 mL | Freq: Two times a day (BID) | ORAL | Status: DC
  Administered 2017-07-09: 30 mL
  Filled 2017-07-09: qty 30

## 2017-07-09 MED ORDER — AMIODARONE LOAD VIA INFUSION
150.0000 mg | Freq: Once | INTRAVENOUS | Status: AC
  Administered 2017-07-09: 150 mg via INTRAVENOUS
  Filled 2017-07-09: qty 83.34

## 2017-07-09 MED ORDER — ACETAMINOPHEN 325 MG PO TABS
325.0000 mg | ORAL_TABLET | ORAL | Status: DC | PRN
  Administered 2017-07-09: 650 mg
  Filled 2017-07-09 (×2): qty 2

## 2017-07-09 MED ORDER — METOPROLOL TARTRATE 5 MG/5ML IV SOLN: 5.0000 mg | Freq: Four times a day (QID) | INTRAVENOUS | Status: DC

## 2017-07-09 MED ORDER — PANTOPRAZOLE SODIUM 40 MG PO PACK
40.0000 mg | PACK | ORAL | Status: DC
  Administered 2017-07-09 – 2017-07-10 (×2): 40 mg
  Filled 2017-07-09 (×2): qty 20

## 2017-07-09 MED ORDER — PANTOPRAZOLE SODIUM 40 MG IV SOLR: 40.0000 mg | INTRAVENOUS | Status: DC

## 2017-07-09 MED ORDER — VITAL HIGH PROTEIN PO LIQD
1000.0000 mL | ORAL | Status: DC
  Administered 2017-07-09: 1000 mL

## 2017-07-09 MED ORDER — ACETAMINOPHEN 650 MG RE SUPP: 325.0000 mg | RECTAL | Status: DC | PRN

## 2017-07-09 MED ORDER — BACITRACIN ZINC 500 UNIT/GM EX OINT
TOPICAL_OINTMENT | Freq: Every day | CUTANEOUS | Status: DC
  Administered 2017-07-10 – 2017-07-13 (×4): 1 via TOPICAL
  Administered 2017-07-14 – 2017-07-15 (×2): via TOPICAL
  Administered 2017-07-16: 1 via TOPICAL
  Administered 2017-07-17 – 2017-07-18 (×2): via TOPICAL
  Filled 2017-07-09 (×4): qty 28.35

## 2017-07-09 MED ORDER — MUPIROCIN 2 % EX OINT
TOPICAL_OINTMENT | CUTANEOUS | Status: AC
  Administered 2017-07-09: 1
  Filled 2017-07-09: qty 22

## 2017-07-09 MED ORDER — ARFORMOTEROL TARTRATE 15 MCG/2ML IN NEBU
15.0000 ug | INHALATION_SOLUTION | Freq: Two times a day (BID) | RESPIRATORY_TRACT | Status: DC
  Administered 2017-07-09 – 2017-07-14 (×10): 15 ug via RESPIRATORY_TRACT
  Filled 2017-07-09 (×11): qty 2

## 2017-07-09 MED ORDER — CALCITRIOL 1 MCG/ML PO SOLN: 0.7500 ug | ORAL | Status: DC

## 2017-07-09 MED ORDER — ORAL CARE MOUTH RINSE
15.0000 mL | OROMUCOSAL | Status: DC
  Administered 2017-07-09 – 2017-07-10 (×16): 15 mL via OROMUCOSAL

## 2017-07-09 MED ORDER — DARBEPOETIN ALFA 150 MCG/0.3ML IJ SOSY: 150.0000 ug | PREFILLED_SYRINGE | INTRAMUSCULAR | Status: DC

## 2017-07-09 MED ORDER — METOPROLOL TARTRATE 5 MG/5ML IV SOLN
5.0000 mg | Freq: Three times a day (TID) | INTRAVENOUS | Status: DC
  Administered 2017-07-09 (×2): 5 mg via INTRAVENOUS
  Filled 2017-07-09 (×2): qty 5

## 2017-07-09 MED ORDER — AMIODARONE HCL IN DEXTROSE 360-4.14 MG/200ML-% IV SOLN
60.0000 mg/h | INTRAVENOUS | Status: AC
  Administered 2017-07-09: 60 mg/h via INTRAVENOUS
  Filled 2017-07-09: qty 200

## 2017-07-09 MED ORDER — ACETAMINOPHEN 160 MG/5ML PO SOLN
650.0000 mg | ORAL | Status: DC | PRN
  Administered 2017-07-09: 650 mg
  Filled 2017-07-09: qty 20.3

## 2017-07-09 MED ORDER — AMIODARONE HCL IN DEXTROSE 360-4.14 MG/200ML-% IV SOLN
INTRAVENOUS | Status: AC
  Filled 2017-07-09: qty 200

## 2017-07-09 NOTE — Progress Notes (Signed)
Initial Nutrition Assessment  DOCUMENTATION CODES:   Not applicable  INTERVENTION:   Vital AF 1.2 @ goal rate of 63ml/hr- Initiate at 34ml/hr and increase by 22ml/hr every 8 hrs until goal rate is reached.    Regimen provides 2160kcal/day, 135g/day protein, and 1489ml/day free water  Renal MVI daily crushed via tube if possible   Vitamin C 500mg  daily crushed via tube  NUTRITION DIAGNOSIS:   Inadequate oral intake related to inability to eat as evidenced by NPO status.  GOAL:   Provide needs based on ASPEN/SCCM guidelines  MONITOR:   Diet advancement, Vent status, Labs, Weight trends, TF tolerance, I & O's, Skin  REASON FOR ASSESSMENT:   Consult Enteral/tube feeding initiation and management  ASSESSMENT:   56 yr old male with a PMHx of COPD, CAD, substance abuse (documented cocaine). HTN Cardiomegaly, ESRD on HD (vie RIJ catheter), Hep B&C, and Tubular adenoma of the colon presented on 12/24 for wound defect of the left arm secondary to skin necrosis and is s/p skin graft.   Pt sedated and ventilated. RD familiar with this pt from recent admit. Pt is usually a good eater and drinks vanilla and butter pecan Nepro. Per chart, pt is weight stable pta but has lost 5lbs since admit. Suspect pt likely with scurvy r/t chronic HD, wounds, and decreased intake of fruits and vegetables. Would recommend daily vitamin C and MVI to encourage wound healing. Would recommend Ocuvite MV daily once pt able to take oral meds as this provides vitamin E, zinc, and copper needed for wound healing.    Medications reviewed and include: darbopoetin, colace, heparin, nicotine, protonix, MVI, NaCl @50ml /hr, fentanyl, zosyn, vancomycin  Labs reviewed: Na 134(L), Cl 95(L), BUN 23(H), creat 5.77(H), P 5.9(H), Mg 2.2 wnl BNP- 1100- 12/28 Wbc- 10.8(H), Hgb 7.6(L), Hct 23.2(L)- 12/28  Nutrition-Focused physical exam completed. Findings are mild fat and muscle depletions, and moderate generalized edema.    Patient is currently intubated on ventilator support MV: 10.6 L/min Temp (24hrs), Avg:100.9 F (38.3 C), Min:97.9 F (36.6 C), Max:103.2 F (39.6 C)  Propofol: none  MAP- >6mmHg  Diet Order:  NPO  EDUCATION NEEDS:   Not appropriate for education at this time  Skin:  Skin Assessment: (Incisions arm and thigh )  Last BM:  12/25  Height:   Ht Readings from Last 1 Encounters:  07/04/17 5\' 9"  (1.753 m)    Weight:   Wt Readings from Last 1 Encounters:  07/08/17 160 lb 15 oz (73 kg)    Ideal Body Weight:  72.7 kg  BMI:  Body mass index is 23.77 kg/m.  Estimated Nutritional Needs:   Kcal:  2228kcal/day   Protein:  116-131g/day   Fluid:  per MD  Koleen Distance MS, RD, LDN Pager #[phone] After Hours Pager: [phone]

## 2017-07-09 NOTE — Progress Notes (Signed)
ETT at 23, cuff leak present, advanced back to 25. BBS are equal and VS stable. More synchronous with Vent now and cuff leak now absent. RT to cont to monitor.

## 2017-07-09 NOTE — Progress Notes (Signed)
  Progress Note    07/09/2017 10:45 AM 5 Days Post-Op  Subjective:  intubated Physical Exam: Incisions:  Left arm STSG is healing nicely & no evidence of infection; left leg continues to dry out.   CBC    Component Value Date/Time   WBC 10.8 (H) 07/08/2017 1959   RBC 2.77 (L) 07/08/2017 1959   HGB 7.6 (L) 07/08/2017 1959   HCT 23.2 (L) 07/08/2017 1959   PLT 121 (L) 07/08/2017 1959   MCV 83.8 07/08/2017 1959   MCH 27.4 07/08/2017 1959   MCHC 32.8 07/08/2017 1959   RDW 19.9 (H) 07/08/2017 1959   LYMPHSABS 1.5 06/19/2017 2030   MONOABS 0.8 06/19/2017 2030   EOSABS 0.0 06/19/2017 2030   BASOSABS 0.0 06/19/2017 2030    BMET    Component Value Date/Time   NA 134 (L) 07/08/2017 1959   K 4.7 07/08/2017 1959   CL 95 (L) 07/08/2017 1959   CO2 26 07/08/2017 1959   GLUCOSE 97 07/08/2017 1959   BUN 23 (H) 07/08/2017 1959   CREATININE 5.77 (H) 07/08/2017 1959   CREATININE 9.18 (H) 02/15/2017 1425   CALCIUM 9.6 07/08/2017 1959   CALCIUM 8.1 (L) 07/11/2011 1117   GFRNONAA 10 (L) 07/08/2017 1959   GFRNONAA 6 (L) 02/15/2017 1425   GFRAA 12 (L) 07/08/2017 1959   GFRAA 7 (L) 02/15/2017 1425    INR    Component Value Date/Time   INR 1.20 06/14/2017 0936      Assessment:  56 y.o. male is s/p:  Split thickness skin graft left arm with left thigh donor site  5 Days Post-Op  Plan: -See Dr. Lianne Moris not from earlier this am -dressing left arm changed and healing nicely with no evidence of infection -PA or MD will do daily dressing changes   Leontine Locket, PA-C [facility] [phone] 07/09/2017 10:45 AM

## 2017-07-09 NOTE — Progress Notes (Addendum)
[facility] Progress Note   Subjective: HD yest afternoon, kept even.  In the evening had resp decompensation. CXR showed vasc congestion, CTA showed no PE, nodular areas possible PNA bilat, anarsarca.  resp status declined and pt was intubated.    Objective Vitals:   07/09/17 1015 07/09/17 1030 07/09/17 1045 07/09/17 1100  BP: 94/61 (!) 90/56 (!) 106/56 108/64  Pulse: (!) 116 (!) 108 (!) 119 (!) 110  Resp:      Temp:      TempSrc:      SpO2: 93% 99% 98% 96%  Weight:      Height:       Physical Exam General: on vent , sedated Neck: +jvd Heart: RRR; no murmur Lungs: diffuse exp wheezing, no rales Extremities: trace LE edema, L thigh with xeroform (skin graft donor site). Bandaged LUE (grafting site). Dialysis Access: TDC in chest.  Dialysis Orders: MWF at [facility]h  73kg   Hep 3000   400/800  2/2 bath  - Mircera 221mg IV q 2 weeks (last 12/19) - Cinacalcet 1254mTIW  Assessment: 1. LUE skin necrosis after L AVF infiltration: s/p skin graft 12/24 2. Acute resp failure: diffuse wheezing, +RSV by resp PCR, new high fever spikes, c/w acute viral resp infection.  CCM following 3. Afib w/ RVR: getting IV MTP prn  4. ESRD: HD MWF.  HD yest. 5. HTN/volume: hypotensive, home meds (dilt 120 and minox 2.5 bid) on hold 6. Anemia of CKD: Hgb 7's, next ESA darbe 150 due Jan 2nd, ordered; transfuse prn 7. Secondary hyperparathyroidism: Corr Ca/Phos ok. Continue Renvela, resume Sensipar w/ HD as outpt. 8. HBV: Per primary. 9. CAD: Per primary.   Plan - no indication for HD today, will follow  RoKelly SplinterD [facility] Centergr ([phone] 07/09/2017, 11:40 AM       Additional Objective Labs: Basic Metabolic Panel: Recent Labs  Lab 07/06/17 0738 07/08/17 1352 07/08/17 1959  NA 134* 132* 134*  K 5.9* 4.3 4.7  CL 92* 94* 95*  CO2 29 28 26   GLUCOSE 97 104* 97  BUN 37* 28* 23*  CREATININE 9.26* 6.67* 5.77*  CALCIUM 9.4 9.0 9.6  PHOS  5.2* 3.6  --    Liver Function Tests: Recent Labs  Lab 07/06/17 0738 07/08/17 1352  ALBUMIN 3.1* 2.9*   CBC: Recent Labs  Lab 07/04/17 1757 07/08/17 1352 07/08/17 1959  WBC 8.1 10.5 10.8*  HGB 8.3* 7.2* 7.6*  HCT 26.1* 22.5* 23.2*  MCV 86.7 82.4 83.8  PLT 162 119* 121*    Medications: . sodium chloride 50 mL/hr (07/09/17 1059)  . sodium chloride    . fentaNYL infusion INTRAVENOUS 200 mcg/hr (07/09/17 1027)  . piperacillin-tazobactam (ZOSYN)  IV 3.375 g (07/09/17 0809)  . [START ON 07/11/2017] vancomycin     . arformoterol  15 mcg Nebulization BID  . bacitracin   Topical Daily  . budesonide (PULMICORT) nebulizer solution  0.5 mg Nebulization BID  . chlorhexidine gluconate (MEDLINE KIT)  15 mL Mouth Rinse BID  . docusate  100 mg Oral BID  . feeding supplement (PRO-STAT SUGAR FREE 64)  30 mL Per Tube BID  . feeding supplement (VITAL HIGH PROTEIN)  1,000 mL Per Tube Q24H  . heparin  5,000 Units Subcutaneous Q8H  . mouth rinse  15 mL Mouth Rinse 10 times per day  . metoprolol tartrate  5 mg Intravenous Q8H  . multivitamin  1 tablet Oral QHS  . nicotine  14 mg Transdermal Daily  . pantoprazole sodium  40 mg Per Tube Q24H

## 2017-07-09 NOTE — Progress Notes (Signed)
Pharmacy Antibiotic Note  [name] is a 56 y.o. male with LUE skin necrosis with prior AVF infection. Pt transferred to ICU due to increasing SOB. Starting empiric abx for fever with unknown source at this time, possibly TDC-related bloodstream infection vs pneumonia. Pt is ESRD on HD - MWF.   Vancomycin random this morning therapeutic at 25 mcg/ml s/p load - no adjustments needed.   Plan: -Zosyn 3.375 g IV q12h -Vancomycin 750 mg IV qHD-MWF -Monitor HD schedule/tolerance, cultures, VR as needed   Height: 5\' 9"  (175.3 cm) Weight: 160 lb 15 oz (73 kg) IBW/kg (Calculated) : 70.7  Temp (24hrs), Avg:99.3 F (37.4 C), Min:97.9 F (36.6 C), Max:103.2 F (39.6 C)  Recent Labs  Lab 07/04/17 1757 07/05/17 0233 07/06/17 0738 07/08/17 1352 07/08/17 1959 07/09/17 0456  WBC 8.1  --   --  10.5 10.8*  --   CREATININE 6.26* 6.86* 9.26* 6.67* 5.77*  --   VANCORANDOM  --   --   --   --   --  25    Estimated Creatinine Clearance: 14.6 mL/min (A) (by C-G formula based on SCr of 5.77 mg/dL (H)).    Allergies  Allergen Reactions  . Aspirin Other (See Comments)    STOMACH PAIN  . Clonidine Derivatives Itching  . Tramadol Itching    Antimicrobials this admission: 12/28 vancomycin > 12/28 zosyn >  Dose adjustments this admission: 12/29 VR: 25 mcg/ml > no adjustments  Microbiology results: 12/28 blood cx:  Arrie Senate, PharmD, BCPS PGY-2 Cardiology Pharmacy Resident Pager: [phone] 07/09/2017

## 2017-07-09 NOTE — Progress Notes (Signed)
Daily Progress Note   Assessment/Planning:   POD #5 s/p L arm STSG   Appreciate PCCM assistance  Neuro: sed for vent  Pulm: possible PNA on CT, no PE, broad spectrum for abx, on vent (mgmt per PCCM)  CV: somewhat labile, unclear if starting to see some septic shock vs cardiogenic shock, may need vasopressor support  GI: keep NPO for now  FEN: may need fluid resuscitation if hypotension is persistent  REN: HD per Renal, BNP elevated but labile pressures   HEME: may need transfusion, prefer to give pRBC rater than fluid  ID: on broad spectrum for presumed PNA   Subjective  - 5 Days Post-Op   Overnight events known, CTA reviewed, pt intubated   Objective   Vitals:   07/09/17 0500 07/09/17 0530 07/09/17 0600 07/09/17 0820  BP: 103/66 98/63 (!) 77/55   Pulse: 93 (!) 101 (!) 118   Resp:      Temp:    (!) 102.7 F (39.3 C)  TempSrc:    Oral  SpO2: 92% 98% 98%   Weight:      Height:         Intake/Output Summary (Last 24 hours) at 07/09/2017 0850 Last data filed at 07/09/2017 0600 Gross per 24 hour  Intake 487.67 ml  Output 0 ml  Net 487.67 ml    PULM  CTAB upper fields, decreased BS R  CV  RR, tachy  GI  soft, NTND  VASC L arm bandaged, L thigh Xeroform adherent  NEURO Sedate on Fentanyl drip    Laboratory   CBC CBC Latest Ref Rng & Units 07/08/2017 07/08/2017 07/04/2017  WBC 4.0 - 10.5 K/uL 10.8(H) 10.5 8.1  Hemoglobin 13.0 - 17.0 g/dL 7.6(L) 7.2(L) 8.3(L)  Hematocrit 39.0 - 52.0 % 23.2(L) 22.5(L) 26.1(L)  Platelets 150 - 400 K/uL 121(L) 119(L) 162    BMET    Component Value Date/Time   NA 134 (L) 07/08/2017 1959   K 4.7 07/08/2017 1959   CL 95 (L) 07/08/2017 1959   CO2 26 07/08/2017 1959   GLUCOSE 97 07/08/2017 1959   BUN 23 (H) 07/08/2017 1959   CREATININE 5.77 (H) 07/08/2017 1959   CREATININE 9.18 (H) 02/15/2017 1425   CALCIUM 9.6 07/08/2017 1959   CALCIUM 8.1 (L) 07/11/2011 1117   GFRNONAA 10 (L) 07/08/2017 1959   GFRNONAA 6 (L)  02/15/2017 1425   GFRAA 12 (L) 07/08/2017 1959   GFRAA 7 (L) 02/15/2017 1425    Radiology     Dg Chest Port 1 View  Result Date: 07/08/2017 CLINICAL DATA:  Verify endotracheal tube EXAM: PORTABLE CHEST 1 VIEW COMPARISON:  07/08/2017 FINDINGS: Endotracheal tube tip is about 5.4 cm superior to the carina. Right-sided central venous catheter tip overlies the right atrium. Cardiomegaly with mild central congestion. Aortic atherosclerosis. No pneumothorax. IMPRESSION: 1. Endotracheal tube tip about 5.4 cm superior to the carina 2. Cardiomegaly with vascular congestion Electronically Signed   By: Donavan Foil M.D.   On: 07/08/2017 22:56   Dg Chest Port 1 View  Result Date: 07/08/2017 CLINICAL DATA:  Dyspnea.  Cocaine abuse EXAM: PORTABLE CHEST 1 VIEW COMPARISON:  June 18, 2017 FINDINGS: The heart size and mediastinal contours are stable. The heart size is enlarged. Dual lumen central venous line is identified unchanged. Mild increased pulmonary interstitium is identified bilaterally. There is no focal pneumonia or pleural effusion. The visualized skeletal structures are unremarkable. IMPRESSION: Cardiomegaly.  Mild interstitial edema. Electronically Signed   By: Abelardo Diesel  M.D.   On: 07/08/2017 19:36   Ct Angio Chest/abd/pel For Dissection W And/or W/wo  Result Date: 07/08/2017 CLINICAL DATA:  56 year old male with acute respiratory illness. Shortness of breath and tachycardia. Dialysis patient. EXAM: CT ANGIOGRAPHY CHEST, ABDOMEN AND PELVIS TECHNIQUE: Multidetector CT imaging through the chest, abdomen and pelvis was performed using the standard protocol during bolus administration of intravenous contrast. Multiplanar reconstructed images and MIPs were obtained and reviewed to evaluate the vascular anatomy. CONTRAST:  ISOVUE-370 IOPAMIDOL (ISOVUE-370) INJECTION 76% COMPARISON:  Chest radiograph dated 07/08/2017 FINDINGS: Evaluation is limited due to streak artifact caused by patient's arms as  well as anasarca. CTA CHEST FINDINGS Cardiovascular: There is moderate cardiomegaly. There is dilatation of the right atrium with retrograde flow of contrast into the IVC consistent with a degree of right heart dysfunction. Correlation with clinical exam and echocardiogram recommended. There is multi vessel coronary vascular calcification. Pericardial effusion measures 14 mm in thickness. There is advanced atherosclerotic calcification of the thoracic aorta. No aneurysmal dilatation or evidence of dissection. There is atherosclerotic calcification of the origins of the great vessels of the aortic arch. Evaluation of the pulmonary arteries is very limited due to suboptimal opacification and streak artifact. No large embolus identified in the main pulmonary artery. Mediastinum/Nodes: Mildly enlarged bilateral hilar lymph nodes. No large mediastinal fluid collection. Lungs/Pleura: There are clusters of nodularity involving the lung bases, left greater right most likely representing an infectious process and developing infiltrate. No lobar consolidation, pleural effusion, or pneumothorax. The central airways are patent. Musculoskeletal: Mild diffuse osteosclerosis most consistent with renal osteodystrophy. Review of the MIP images confirms the above findings. CTA ABDOMEN AND PELVIS FINDINGS VASCULAR Aorta: Advanced atherosclerotic disease. No aneurysmal dilatation or evidence of dissection. Celiac: There is atherosclerotic calcification of the celiac artery. The visualized portion of the celiac artery appears patent. SMA: Advanced atherosclerotic calcification of the SMA. The SMA remains patent. Renals: Advanced atherosclerotic calcification of the renal artery is. There is diminished flow in the renal arteries. IMA: Poorly visualized. Inflow: Advanced atherosclerotic calcification. The iliac arteries appear patent. Veins: Poorly visualized.  No portal venous gas. Review of the MIP images confirms the above findings.  NON-VASCULAR No intra-abdominal free air.  Small ascites. Hepatobiliary: 30 limited evaluation of the liver. The liver appears enlarged measuring 18 cm in midclavicular length. The gallbladder is not well visualized. Pancreas: Not well visualized. Spleen: Poorly visualized grossly unremarkable. Adrenals/Urinary Tract: The adrenal glands are not well visualized. Bilateral renal atrophy and renovascular calcification. The urinary bladder is predominantly collapsed. Stomach/Bowel: There is redundancy of the sigmoid colon. There is moderate stool in the distal colon as well as in the rectal vault which may represent a degree of fecal impaction. Clinical correlation is recommended. Evaluation of the bowel is very limited in the absence of oral contrast and secondary to ascites and anasarca. No dilated small bowel loop identified. A faint and poorly visualized tubular structure medial to the cecum may represent a normal appendix. Lymphatic: Evaluation of the lymph nodes is very limited. Reproductive: The prostate and seminal vesicles are poorly visualized. Other: Diffuse edema and anasarca. Musculoskeletal: Osteosclerosis in keeping with renal osteodystrophy. No acute osseous pathology. Review of the MIP images confirms the above findings. IMPRESSION: 1. Very limited study due to streak artifact caused by patient's arms and anasarca. No aortic aneurysm or dissection. 2. Moderate Aortic Atherosclerosis (ICD10-I70.0). 3. Bibasilar clusters of nodularity concerning for pneumonia. Clinical correlation is recommended. 4. Moderate cardiomegaly and multi vessel coronary vascular calcification and  findings of right heart dysfunction correlation with clinical exam and echocardiogram recommended. Pericardial effusion measuring 14 mm in thickness. 5. Osteosclerosis in keeping with renal osteodystrophy. 6. Small ascites and anasarca. Electronically Signed   By: Anner Crete M.D.   On: 07/08/2017 22:01     Adele Barthel, MD,  FACS [facility] Office: [phone] Pager: [phone]  07/09/2017, 8:50 AM

## 2017-07-09 NOTE — Progress Notes (Signed)
Spoke with oldest sister, Redmond Whittley.  She states his emergency contact person listed notified her of pt's transfer to ICU.  [name] is to be primary emergency contact for any issues.

## 2017-07-09 NOTE — Progress Notes (Signed)
Dr. Jimmy Footman, [facility] Lacy, notified of 1 hr ABG post intubation.  Orders received.  Will continue to monitor closely.

## 2017-07-09 NOTE — Progress Notes (Signed)
.. ..  Name: [name] MRN: [id number] DOB: [birth year]    ADMISSION DATE:  07/04/2017 CONSULTATION DATE:  07/08/2017  REFERRING MD :  Adele Barthel MD  CHIEF COMPLAINT:  Dyspnea and cough  BRIEF PATIENT DESCRIPTION:  56 yo male smoker presented with Lt arm wound.  He developed fever, respiratory distress and required intubation. PMHx of Pulmonary HTN, mild COPD, ESRD, HTN, CAD s/p stent, mitral stenosis, GERD, Cocaine abuse, Hep B and C.  SUBJECTIVE:  Sedated.  VITAL SIGNS: BP (!) 91/50   Pulse (!) 134   Temp (!) 102.7 F (39.3 C) (Oral)   Resp (!) 21   Ht 5\' 9"  (1.753 m)   Wt 160 lb 15 oz (73 kg)   SpO2 98%   BMI 23.77 kg/m   INTAKE/OUTPUT: I/O last 3 completed shifts: In: 487.7 [P.O.:240; I.V.:197.7; IV Piggyback:50] Out: 0   PHYSICAL EXAMINATION:  General - sedated Eyes - pupils reactive ENT - ETT in place Cardiac - irregular, 2/6 murmur Chest - scattered rhonchi Abd - soft, non tender Ext - Lt arm in wrap, Lt thigh skin graft site clean Skin - no rashes Neuro - RASS -2   LABS: CBC Recent Labs    07/08/17 1352 07/08/17 1959  WBC 10.5 10.8*  HGB 7.2* 7.6*  HCT 22.5* 23.2*  PLT 119* 121*    Coag's No results for input(s): APTT, INR in the last 72 hours.  BMET Recent Labs    07/08/17 1352 07/08/17 1959  NA 132* 134*  K 4.3 4.7  CL 94* 95*  CO2 28 26  BUN 28* 23*  CREATININE 6.67* 5.77*  GLUCOSE 104* 97    Electrolytes Recent Labs    07/08/17 1352 07/08/17 1959  CALCIUM 9.0 9.6  PHOS 3.6  --     Sepsis Markers No results for input(s): PROCALCITON, O2SATVEN in the last 72 hours.  Invalid input(s): LACTICACIDVEN  ABG Recent Labs    07/08/17 1817 07/08/17 2357  PHART 7.449 7.437  PCO2ART 40.0 43.1  PO2ART 72.4* 439.0*    Liver Enzymes Recent Labs    07/08/17 1352  ALBUMIN 2.9*    Cardiac Enzymes No results for input(s): TROPONINI, PROBNP in the last 72 hours.  Glucose No results for input(s): GLUCAP  in the last 72 hours.  Imaging Dg Chest Port 1 View  Result Date: 07/08/2017 CLINICAL DATA:  Verify endotracheal tube EXAM: PORTABLE CHEST 1 VIEW COMPARISON:  07/08/2017 FINDINGS: Endotracheal tube tip is about 5.4 cm superior to the carina. Right-sided central venous catheter tip overlies the right atrium. Cardiomegaly with mild central congestion. Aortic atherosclerosis. No pneumothorax. IMPRESSION: 1. Endotracheal tube tip about 5.4 cm superior to the carina 2. Cardiomegaly with vascular congestion Electronically Signed   By: Donavan Foil M.D.   On: 07/08/2017 22:56   Dg Chest Port 1 View  Result Date: 07/08/2017 CLINICAL DATA:  Dyspnea.  Cocaine abuse EXAM: PORTABLE CHEST 1 VIEW COMPARISON:  June 18, 2017 FINDINGS: The heart size and mediastinal contours are stable. The heart size is enlarged. Dual lumen central venous line is identified unchanged. Mild increased pulmonary interstitium is identified bilaterally. There is no focal pneumonia or pleural effusion. The visualized skeletal structures are unremarkable. IMPRESSION: Cardiomegaly.  Mild interstitial edema. Electronically Signed   By: Abelardo Diesel M.D.   On: 07/08/2017 19:36   Ct Angio Chest/abd/pel For Dissection W And/or W/wo  Result Date: 07/08/2017 CLINICAL DATA:  56 year old male with acute respiratory illness. Shortness of breath and tachycardia. Dialysis  patient. EXAM: CT ANGIOGRAPHY CHEST, ABDOMEN AND PELVIS TECHNIQUE: Multidetector CT imaging through the chest, abdomen and pelvis was performed using the standard protocol during bolus administration of intravenous contrast. Multiplanar reconstructed images and MIPs were obtained and reviewed to evaluate the vascular anatomy. CONTRAST:  ISOVUE-370 IOPAMIDOL (ISOVUE-370) INJECTION 76% COMPARISON:  Chest radiograph dated 07/08/2017 FINDINGS: Evaluation is limited due to streak artifact caused by patient's arms as well as anasarca. CTA CHEST FINDINGS Cardiovascular: There is  moderate cardiomegaly. There is dilatation of the right atrium with retrograde flow of contrast into the IVC consistent with a degree of right heart dysfunction. Correlation with clinical exam and echocardiogram recommended. There is multi vessel coronary vascular calcification. Pericardial effusion measures 14 mm in thickness. There is advanced atherosclerotic calcification of the thoracic aorta. No aneurysmal dilatation or evidence of dissection. There is atherosclerotic calcification of the origins of the great vessels of the aortic arch. Evaluation of the pulmonary arteries is very limited due to suboptimal opacification and streak artifact. No large embolus identified in the main pulmonary artery. Mediastinum/Nodes: Mildly enlarged bilateral hilar lymph nodes. No large mediastinal fluid collection. Lungs/Pleura: There are clusters of nodularity involving the lung bases, left greater right most likely representing an infectious process and developing infiltrate. No lobar consolidation, pleural effusion, or pneumothorax. The central airways are patent. Musculoskeletal: Mild diffuse osteosclerosis most consistent with renal osteodystrophy. Review of the MIP images confirms the above findings. CTA ABDOMEN AND PELVIS FINDINGS VASCULAR Aorta: Advanced atherosclerotic disease. No aneurysmal dilatation or evidence of dissection. Celiac: There is atherosclerotic calcification of the celiac artery. The visualized portion of the celiac artery appears patent. SMA: Advanced atherosclerotic calcification of the SMA. The SMA remains patent. Renals: Advanced atherosclerotic calcification of the renal artery is. There is diminished flow in the renal arteries. IMA: Poorly visualized. Inflow: Advanced atherosclerotic calcification. The iliac arteries appear patent. Veins: Poorly visualized.  No portal venous gas. Review of the MIP images confirms the above findings. NON-VASCULAR No intra-abdominal free air.  Small ascites.  Hepatobiliary: 30 limited evaluation of the liver. The liver appears enlarged measuring 18 cm in midclavicular length. The gallbladder is not well visualized. Pancreas: Not well visualized. Spleen: Poorly visualized grossly unremarkable. Adrenals/Urinary Tract: The adrenal glands are not well visualized. Bilateral renal atrophy and renovascular calcification. The urinary bladder is predominantly collapsed. Stomach/Bowel: There is redundancy of the sigmoid colon. There is moderate stool in the distal colon as well as in the rectal vault which may represent a degree of fecal impaction. Clinical correlation is recommended. Evaluation of the bowel is very limited in the absence of oral contrast and secondary to ascites and anasarca. No dilated small bowel loop identified. A faint and poorly visualized tubular structure medial to the cecum may represent a normal appendix. Lymphatic: Evaluation of the lymph nodes is very limited. Reproductive: The prostate and seminal vesicles are poorly visualized. Other: Diffuse edema and anasarca. Musculoskeletal: Osteosclerosis in keeping with renal osteodystrophy. No acute osseous pathology. Review of the MIP images confirms the above findings. IMPRESSION: 1. Very limited study due to streak artifact caused by patient's arms and anasarca. No aortic aneurysm or dissection. 2. Moderate Aortic Atherosclerosis (ICD10-I70.0). 3. Bibasilar clusters of nodularity concerning for pneumonia. Clinical correlation is recommended. 4. Moderate cardiomegaly and multi vessel coronary vascular calcification and findings of right heart dysfunction correlation with clinical exam and echocardiogram recommended. Pericardial effusion measuring 14 mm in thickness. 5. Osteosclerosis in keeping with renal osteodystrophy. 6. Small ascites and anasarca. Electronically Signed  By: Anner Crete M.D.   On: 07/08/2017 22:01    STUDIES: Echo 06/01/17 >> mod LVH, severe MS, PAS 95 mmHg CT angio  chest/abd/pelvis 07/08/17 >> atherosclerosis, b/l nodular clusters concerning for PNA, CM  CULTURES: Blood 12/28 >>  Respiratory viral panel 12/29 >> RSV  ANTIBIOTICS: Zosyn 12/28 >> Vancomycin 12/28 >>   LINES/TUBES: RT [place] HD catheter >>  ETT 12/28 >>   EVENTS: 12/24 Admit, split thickness skin graft Lt arm  12/28 VDRF  DISCUSSION: 56 yo male smoker with acute hypoxic respiratory failure from PNA with respiratory viral panel positive for RSV.  ASSESSMENT / PLAN:  Acute hypoxic respiratory failure. Viral pneumonitis. Tobacco abuse. Hx of mild COPD. - full vent support - f/u CXR - day 2 of abx - check procalcitonin and then decide about Abx - scheduled BDs  A fib with RVR. Hx of CAD, HTN, severe MS, pulmonary HTN. - lopressor 5 mg IV q8h  ESRD on HD. - per renal  Anemia of chronic disease. - f/u CBC  Acute metabolic encephalopathy. - RASS goal 0 to -1  DVT prophylaxis: SQ heparin SUP: Protonix Nutrition: Tube feeds Goals of care: Full code  CC time 32 minutes  Chesley Mires, MD [place] 07/09/2017, 10:03 AM Pager:  [phone] After 3pm call: [phone]

## 2017-07-10 ENCOUNTER — Inpatient Hospital Stay (HOSPITAL_COMMUNITY): Payer: Medicare Other

## 2017-07-10 DIAGNOSIS — Z978 Presence of other specified devices: Secondary | ICD-10-CM | POA: Insufficient documentation

## 2017-07-10 DIAGNOSIS — I472 Ventricular tachycardia: Secondary | ICD-10-CM | POA: Insufficient documentation

## 2017-07-10 DIAGNOSIS — R000 Tachycardia, unspecified: Secondary | ICD-10-CM | POA: Insufficient documentation

## 2017-07-10 LAB — POCT I-STAT 3, ART BLOOD GAS (G3+)
Acid-base deficit: 1 mmol/L (ref 0.0–2.0)
Bicarbonate: 23.9 mmol/L (ref 20.0–28.0)
O2 Saturation: 97 %
Patient temperature: 98.4
TCO2: 25 mmol/L (ref 22–32)
pCO2 arterial: 39.7 mmHg (ref 32.0–48.0)
pH, Arterial: 7.387 (ref 7.350–7.450)
pO2, Arterial: 87 mmHg (ref 83.0–108.0)

## 2017-07-10 LAB — BLOOD GAS, ARTERIAL
Acid-base deficit: 0.7 mmol/L (ref 0.0–2.0)
Bicarbonate: 23.9 mmol/L (ref 20.0–28.0)
Drawn by: 414221
FIO2: 100
MECHVT: 570 mL
O2 Saturation: 99.9 %
PEEP: 5 cmH2O
Patient temperature: 98.6
RATE: 16 resp/min
pCO2 arterial: 43 mmHg (ref 32.0–48.0)
pH, Arterial: 7.364 (ref 7.350–7.450)
pO2, Arterial: 416 mmHg — ABNORMAL HIGH (ref 83.0–108.0)

## 2017-07-10 LAB — BASIC METABOLIC PANEL
Anion gap: 13 (ref 5–15)
BUN: 38 mg/dL — ABNORMAL HIGH (ref 6–20)
CO2: 21 mmol/L — ABNORMAL LOW (ref 22–32)
Calcium: 8.1 mg/dL — ABNORMAL LOW (ref 8.9–10.3)
Chloride: 101 mmol/L (ref 101–111)
Creatinine, Ser: 6.54 mg/dL — ABNORMAL HIGH (ref 0.61–1.24)
GFR calc Af Amer: 10 mL/min — ABNORMAL LOW (ref >60)
GFR calc non Af Amer: 9 mL/min — ABNORMAL LOW (ref >60)
Glucose, Bld: 117 mg/dL — ABNORMAL HIGH (ref 65–99)
Potassium: 4.3 mmol/L (ref 3.5–5.1)
Sodium: 135 mmol/L (ref 135–145)

## 2017-07-10 LAB — RENAL FUNCTION PANEL
Albumin: 3 g/dL — ABNORMAL LOW (ref 3.5–5.0)
Albumin: 2.8 g/dL — ABNORMAL LOW (ref 3.5–5.0)
Anion gap: 17 — ABNORMAL HIGH (ref 5–15)
Anion gap: 15 (ref 5–15)
BUN: 43 mg/dL — ABNORMAL HIGH (ref 6–20)
BUN: 43 mg/dL — ABNORMAL HIGH (ref 6–20)
CO2: 21 mmol/L — ABNORMAL LOW (ref 22–32)
CO2: 23 mmol/L (ref 22–32)
Calcium: 9.3 mg/dL (ref 8.9–10.3)
Calcium: 9.3 mg/dL (ref 8.9–10.3)
Chloride: 95 mmol/L — ABNORMAL LOW (ref 101–111)
Chloride: 96 mmol/L — ABNORMAL LOW (ref 101–111)
Creatinine, Ser: 7.47 mg/dL — ABNORMAL HIGH (ref 0.61–1.24)
Creatinine, Ser: 7.43 mg/dL — ABNORMAL HIGH (ref 0.61–1.24)
GFR calc Af Amer: 9 mL/min — ABNORMAL LOW (ref >60)
GFR calc Af Amer: 9 mL/min — ABNORMAL LOW (ref >60)
GFR calc non Af Amer: 7 mL/min — ABNORMAL LOW (ref >60)
GFR calc non Af Amer: 7 mL/min — ABNORMAL LOW (ref >60)
Glucose, Bld: 118 mg/dL — ABNORMAL HIGH (ref 65–99)
Glucose, Bld: 128 mg/dL — ABNORMAL HIGH (ref 65–99)
Phosphorus: 5.9 mg/dL — ABNORMAL HIGH (ref 2.5–4.6)
Phosphorus: 5.5 mg/dL — ABNORMAL HIGH (ref 2.5–4.6)
Potassium: 5.6 mmol/L — ABNORMAL HIGH (ref 3.5–5.1)
Potassium: 4.8 mmol/L (ref 3.5–5.1)
Sodium: 133 mmol/L — ABNORMAL LOW (ref 135–145)
Sodium: 134 mmol/L — ABNORMAL LOW (ref 135–145)

## 2017-07-10 LAB — CBC
HCT: 25.4 % — ABNORMAL LOW (ref 39.0–52.0)
HCT: 21.8 % — ABNORMAL LOW (ref 39.0–52.0)
Hemoglobin: 8.3 g/dL — ABNORMAL LOW (ref 13.0–17.0)
Hemoglobin: 7.1 g/dL — ABNORMAL LOW (ref 13.0–17.0)
MCH: 26.9 pg (ref 26.0–34.0)
MCH: 26.8 pg (ref 26.0–34.0)
MCHC: 32.7 g/dL (ref 30.0–36.0)
MCHC: 32.6 g/dL (ref 30.0–36.0)
MCV: 82.5 fL (ref 78.0–100.0)
MCV: 82.3 fL (ref 78.0–100.0)
Platelets: 164 10*3/uL (ref 150–400)
Platelets: 139 10*3/uL — ABNORMAL LOW (ref 150–400)
RBC: 3.08 MIL/uL — ABNORMAL LOW (ref 4.22–5.81)
RBC: 2.65 MIL/uL — ABNORMAL LOW (ref 4.22–5.81)
RDW: 20.9 % — ABNORMAL HIGH (ref 11.5–15.5)
RDW: 21.1 % — ABNORMAL HIGH (ref 11.5–15.5)
WBC: 9.8 10*3/uL (ref 4.0–10.5)
WBC: 7.5 10*3/uL (ref 4.0–10.5)

## 2017-07-10 LAB — POCT I-STAT 4, (NA,K, GLUC, HGB,HCT)
Glucose, Bld: 133 mg/dL — ABNORMAL HIGH (ref 65–99)
HCT: 25 % — ABNORMAL LOW (ref 39.0–52.0)
Hemoglobin: 8.5 g/dL — ABNORMAL LOW (ref 13.0–17.0)
Potassium: 4.9 mmol/L (ref 3.5–5.1)
Sodium: 135 mmol/L (ref 135–145)

## 2017-07-10 LAB — GLUCOSE, CAPILLARY
Glucose-Capillary: 135 mg/dL — ABNORMAL HIGH (ref 65–99)
Glucose-Capillary: 125 mg/dL — ABNORMAL HIGH (ref 65–99)
Glucose-Capillary: 127 mg/dL — ABNORMAL HIGH (ref 65–99)
Glucose-Capillary: 124 mg/dL — ABNORMAL HIGH (ref 65–99)
Glucose-Capillary: 115 mg/dL — ABNORMAL HIGH (ref 65–99)

## 2017-07-10 LAB — TROPONIN I: Troponin I: 0.32 ng/mL (ref <0.03)

## 2017-07-10 LAB — MAGNESIUM
Magnesium: 2.3 mg/dL (ref 1.7–2.4)
Magnesium: 2.4 mg/dL (ref 1.7–2.4)

## 2017-07-10 LAB — PHOSPHORUS: Phosphorus: 5.9 mg/dL — ABNORMAL HIGH (ref 2.5–4.6)

## 2017-07-10 LAB — APTT: aPTT: 44 seconds — ABNORMAL HIGH (ref 24–36)

## 2017-07-10 MED ORDER — ALTEPLASE 2 MG IJ SOLR
2.0000 mg | Freq: Once | INTRAMUSCULAR | Status: DC | PRN
  Filled 2017-07-10: qty 2

## 2017-07-10 MED ORDER — PRISMASOL BGK 0/2.5 32-2.5 MEQ/L IV SOLN
INTRAVENOUS | Status: DC
  Administered 2017-07-10 – 2017-07-12 (×17): via INTRAVENOUS_CENTRAL
  Filled 2017-07-10 (×32): qty 5000

## 2017-07-10 MED ORDER — SODIUM CHLORIDE 0.9 % IV SOLN: INTRAVENOUS | Status: DC | PRN

## 2017-07-10 MED ORDER — HEPARIN SODIUM (PORCINE) 1000 UNIT/ML DIALYSIS
1000.0000 [IU] | INTRAMUSCULAR | Status: DC | PRN
  Administered 2017-07-12: 3400 [IU] via INTRAVENOUS_CENTRAL
  Filled 2017-07-10: qty 6
  Filled 2017-07-10: qty 4
  Filled 2017-07-10: qty 6

## 2017-07-10 MED ORDER — PRISMASOL BGK 4/2.5 32-4-2.5 MEQ/L IV SOLN
INTRAVENOUS | Status: DC
  Administered 2017-07-10 – 2017-07-11 (×3): via INTRAVENOUS_CENTRAL
  Filled 2017-07-10 (×4): qty 5000

## 2017-07-10 MED ORDER — RACEPINEPHRINE HCL 2.25 % IN NEBU
INHALATION_SOLUTION | RESPIRATORY_TRACT | Status: AC
  Administered 2017-07-10: 0.5 mL
  Filled 2017-07-10: qty 0.5

## 2017-07-10 MED ORDER — DARBEPOETIN ALFA 150 MCG/0.3ML IJ SOSY
150.0000 ug | PREFILLED_SYRINGE | Freq: Once | INTRAMUSCULAR | Status: DC
  Filled 2017-07-10: qty 0.3

## 2017-07-10 MED ORDER — ACETAMINOPHEN 325 MG PO TABS: 650.0000 mg | ORAL_TABLET | Freq: Four times a day (QID) | ORAL | Status: DC | PRN

## 2017-07-10 MED ORDER — AMIODARONE LOAD VIA INFUSION
150.0000 mg | Freq: Once | INTRAVENOUS | Status: AC
  Administered 2017-07-10: 150 mg via INTRAVENOUS
  Filled 2017-07-10: qty 83.34

## 2017-07-10 MED ORDER — METOPROLOL TARTRATE 25 MG PO TABS
25.0000 mg | ORAL_TABLET | Freq: Two times a day (BID) | ORAL | Status: DC
  Administered 2017-07-10 (×2): 25 mg via ORAL
  Filled 2017-07-10 (×2): qty 1

## 2017-07-10 MED ORDER — NOREPINEPHRINE BITARTRATE 1 MG/ML IV SOLN
0.0000 ug/min | INTRAVENOUS | Status: DC
  Administered 2017-07-10: 2 ug/min via INTRAVENOUS
  Filled 2017-07-10: qty 4

## 2017-07-10 MED ORDER — PIPERACILLIN-TAZOBACTAM 3.375 G IVPB 30 MIN
3.3750 g | Freq: Four times a day (QID) | INTRAVENOUS | Status: DC
  Administered 2017-07-10 – 2017-07-12 (×9): 3.375 g via INTRAVENOUS
  Filled 2017-07-10 (×11): qty 50

## 2017-07-10 MED ORDER — MAGNESIUM SULFATE 50 % IJ SOLN
2.0000 g | Freq: Once | INTRAMUSCULAR | Status: AC
  Administered 2017-07-10: 2 g via INTRAVENOUS

## 2017-07-10 MED ORDER — LORAZEPAM 2 MG/ML IJ SOLN
2.0000 mg | Freq: Once | INTRAMUSCULAR | Status: AC
  Administered 2017-07-10: 2 mg via INTRAVENOUS

## 2017-07-10 MED ORDER — AMIODARONE IV BOLUS ONLY 150 MG/100ML: 150.0000 mg | Freq: Once | INTRAVENOUS | Status: DC

## 2017-07-10 MED ORDER — CALCIUM CHLORIDE 10 % IV SOLN
1.0000 g | Freq: Once | INTRAVENOUS | Status: AC
  Administered 2017-07-10: 1 g via INTRAVENOUS

## 2017-07-10 MED ORDER — SODIUM CHLORIDE 0.9 % IV SOLN
1.0000 g | Freq: Once | INTRAVENOUS | Status: AC
  Filled 2017-07-10: qty 10

## 2017-07-10 MED ORDER — CLOPIDOGREL BISULFATE 75 MG PO TABS
75.0000 mg | ORAL_TABLET | Freq: Every day | ORAL | Status: DC
  Administered 2017-07-10 – 2017-07-15 (×6): 75 mg via ORAL
  Filled 2017-07-10 (×6): qty 1

## 2017-07-10 MED ORDER — MORPHINE SULFATE (PF) 4 MG/ML IV SOLN
1.0000 mg | INTRAVENOUS | Status: DC | PRN
  Administered 2017-07-11 – 2017-07-15 (×15): 2 mg via INTRAVENOUS
  Filled 2017-07-10 (×16): qty 1

## 2017-07-10 MED ORDER — ORAL CARE MOUTH RINSE
15.0000 mL | Freq: Two times a day (BID) | OROMUCOSAL | Status: DC
  Administered 2017-07-10 – 2017-07-11 (×3): 15 mL via OROMUCOSAL

## 2017-07-10 MED ORDER — AMIODARONE IV BOLUS ONLY 150 MG/100ML: 150.0000 mg | Freq: Once | INTRAVENOUS | Status: AC

## 2017-07-10 MED ORDER — LORAZEPAM 2 MG/ML IJ SOLN
INTRAMUSCULAR | Status: AC
  Administered 2017-07-10: 2 mg via INTRAVENOUS
  Filled 2017-07-10: qty 1

## 2017-07-10 MED ORDER — PRISMASOL BGK 4/2.5 32-4-2.5 MEQ/L IV SOLN
INTRAVENOUS | Status: DC
  Administered 2017-07-10 – 2017-07-12 (×7): via INTRAVENOUS_CENTRAL
  Filled 2017-07-10 (×9): qty 5000

## 2017-07-10 NOTE — Progress Notes (Signed)
[facility] Progress Note   Subjective: decompensated with hypotension and WCT, was shocked into NSR around 3-4 am today.  Cardiology evaluating. Was on pressors but is not mostly off.  CXR shows CM, no edema or infiltrates.   Objective Vitals:   07/10/17 1049 07/10/17 1115 07/10/17 1159 07/10/17 1217  BP:  (!) 106/50    Pulse: 66 66    Resp: 14     Temp:   97.6 F (36.4 C)   TempSrc:   Oral   SpO2: 100%   100%  Weight:      Height:       Physical Exam General: on vent , sedated Neck: +jvd Heart: RRR; no murmur Lungs: diffuse exp wheezing, no rales Extremities: trace LE edema, L thigh with xeroform (skin graft donor site). Bandaged LUE (grafting site). Dialysis Access: TDC in chest.  Dialysis Orders: MWF at [facility] 4h  73kg   Hep 3000   400/800  2/2 bath  - Mircera 251mg IV q 2 weeks (last 12/19) - Cinacalcet [id number]  Assessment: 1. LUE skin necrosis after L AVF infiltration: s/p skin graft 12/24 2. A/C resp failure on vent: associated with viral PNA/ +RSV. Per CCM 3. Afib w/ RVR: per cards, on IV amio 4. ESRD: has HD Friday, started CRRT on 12/29.  5. Vol overload: only 2kg up by wts but diffuse anasarca/ LE and UE edema. Has known severe pulm HTN and prob has RV failure. CXR clear.  6. HTN: due to shock his home BP meds are on hold 7. Anemia of CKD: Hgb 8's, next ESA darbe 150 due Jan 2nd, ordered; transfuse prn 8. Secondary hyperparathyroidism: Corr Ca/Phos ok. Continue Renvela, resume Sensipar w/ HD as outpt. 9. HBV: Per primary. 10. CAD: Per primary.   Plan - cont CRRT, D#2, try to pull some volume if BP will allow  RoKelly SplinterD [facility] [phone] 07/10/2017, 1:08 PM       Additional Objective Labs: Basic Metabolic Panel: Recent Labs  Lab 07/09/17 1628 07/10/17 0321 07/10/17 0404 07/10/17 0705  NA  --  133* 135 134*  K  --  5.6* 4.3 4.8  CL  --  95* 101 96*  CO2  --  21* 21* 23  GLUCOSE  --   118* 117* 128*  BUN  --  43* 38* 43*  CREATININE  --  7.47* 6.54* 7.43*  CALCIUM  --  9.3 8.1* 9.3  PHOS 5.4* 5.9*  5.9*  --  5.5*   Liver Function Tests: Recent Labs  Lab 07/08/17 1352 07/10/17 0321 07/10/17 0705  ALBUMIN 2.9* 3.0* 2.8*   CBC: Recent Labs  Lab 07/04/17 1757 07/08/17 1352 07/08/17 1959 07/10/17 0321 07/10/17 0404  WBC 8.1 10.5 10.8* 9.8 7.5  HGB 8.3* 7.2* 7.6* 8.3* 7.1*  HCT 26.1* 22.5* 23.2* 25.4* 21.8*  MCV 86.7 82.4 83.8 82.5 82.3  PLT 162 119* 121* 164 139*    Medications: . sodium chloride 50 mL/hr at 07/10/17 1200  . sodium chloride    . amiodarone 30 mg/hr (07/10/17 1200)  . norepinephrine (LEVOPHED) Adult infusion Stopped (07/10/17 0700)  . piperacillin-tazobactam Stopped (07/10/17 090347 . dialysate (PRISMASATE) 2,000 mL/hr at 07/10/17 1137  . dialysis replacement fluid (prismasate) 400 mL/hr at 07/10/17 [id number]. dialysis replacement fluid (prismasate) 200 mL/hr at 07/10/17 [id number]. sodium chloride     . arformoterol  15 mcg Nebulization BID  . bacitracin   Topical Daily  .  budesonide (PULMICORT) nebulizer solution  0.5 mg Nebulization BID  . chlorhexidine gluconate (MEDLINE KIT)  15 mL Mouth Rinse BID  . clopidogrel  75 mg Oral Daily  . [START ON 07/13/2017] darbepoetin (ARANESP) injection - DIALYSIS  150 mcg Intravenous Q Wed-HD  . docusate  100 mg Oral BID  . heparin  5,000 Units Subcutaneous Q8H  . mouth rinse  15 mL Mouth Rinse 10 times per day  . metoprolol tartrate  25 mg Oral BID  . multivitamin  1 tablet Oral QHS  . nicotine  14 mg Transdermal Daily  . pantoprazole sodium  40 mg Per Tube Q24H

## 2017-07-10 NOTE — Progress Notes (Signed)
.. ..  Name: [name] MRN: [id number] DOB: 30-Apr-1961    ADMISSION DATE:  07/04/2017 CONSULTATION DATE:  07/08/2017  REFERRING MD :  Adele Barthel MD  CHIEF COMPLAINT:  Dyspnea and cough  BRIEF PATIENT DESCRIPTION:  56 yo male smoker presented with Lt arm wound.  He developed fever, respiratory distress and required intubation.  PMHx of Pulmonary HTN, mild COPD, ESRD, HTN, CAD s/p stent, mitral stenosis, GERD, Cocaine abuse, Hep B and C.  SUBJECTIVE:  Had WCT last night.  Improved this AM.  On dialysis.  Tolerating pressure support.  VITAL SIGNS: BP (!) 106/50   Pulse 66   Temp 98.6 F (37 C) (Oral)   Resp 14   Ht 5\' 9"  (1.753 m)   Wt 165 lb 5.5 oz (75 kg)   SpO2 100%   BMI 24.42 kg/m   INTAKE/OUTPUT: I/O last 3 completed shifts: In: 3254.5 [I.V.:1334.5; NG/GT:770; IV Piggyback:1150] Out: 50 [Other:68]  PHYSICAL EXAMINATION:  General - alert Eyes - pupils reactive ENT - ETT in place Cardiac - irregular, no murmur Chest - no wheeze, rales Abd - soft, non tender Ext - no edema Skin - Lt arm in wrap, Lt thigh graft site clean Neuro - RASS 0   LABS: CBC Recent Labs    07/08/17 1959 07/10/17 0321 07/10/17 0404  WBC 10.8* 9.8 7.5  HGB 7.6* 8.3* 7.1*  HCT 23.2* 25.4* 21.8*  PLT 121* 164 139*    Coag's Recent Labs    07/10/17 0706  APTT 44*    BMET Recent Labs    07/10/17 0321 07/10/17 0404 07/10/17 0705  NA 133* 135 134*  K 5.6* 4.3 4.8  CL 95* 101 96*  CO2 21* 21* 23  BUN 43* 38* 43*  CREATININE 7.47* 6.54* 7.43*  GLUCOSE 118* 117* 128*    Electrolytes Recent Labs    07/09/17 1628 07/10/17 0321 07/10/17 0404 07/10/17 0705 07/10/17 0706  CALCIUM  --  9.3 8.1* 9.3  --   MG 2.2 2.3  --   --  2.4  PHOS 5.4* 5.9*  5.9*  --  5.5*  --     Sepsis Markers Recent Labs    07/09/17 1042  PROCALCITON 5.63    ABG Recent Labs    07/08/17 1817 07/08/17 2357 07/10/17 0539  PHART 7.449 7.437 7.364  PCO2ART 40.0 43.1  43.0  PO2ART 72.4* 439.0* 416*    Liver Enzymes Recent Labs    07/08/17 1352 07/10/17 0321 07/10/17 0705  ALBUMIN 2.9* 3.0* 2.8*    Cardiac Enzymes Recent Labs    07/10/17 0404  TROPONINI 0.32*    Glucose Recent Labs    07/09/17 1206 07/09/17 1634 07/09/17 1932 07/10/17 0330 07/10/17 0818  GLUCAP 87 106* 121* 135* 125*    Imaging Dg Abd 1 View  Result Date: 07/10/2017 CLINICAL DATA:  Abdominal distention. EXAM: ABDOMEN - 1 VIEW COMPARISON:  04/23/2017 FINDINGS: There is gaseous distension of the colon. Moderate stool burden identified within the rectum. No abnormal small bowel dilatation. IMPRESSION: 1. Gaseous distension of the colon with moderate stool burden in the rectum. Correlate for any clinical signs or symptoms of rectal impaction or constipation. Electronically Signed   By: Kerby Moors M.D.   On: 07/10/2017 10:32   Dg Chest Port 1 View  Result Date: 07/10/2017 CLINICAL DATA:  56 year old male with a history of intubation EXAM: PORTABLE CHEST 1 VIEW COMPARISON:  Chest x-ray 07/10/2017, 07/08/2017, CT 07/08/2017 FINDINGS: Cardiomediastinal silhouette unchanged with cardiomegaly. No  enlarged central vasculature. No interlobular septal thickening. No pneumothorax.  No confluent airspace disease. Unchanged position of endotracheal tube, terminating approximately 6.3 cm above the carina. Unchanged gastric tube terminating out of the field of view. Side port terminates in the distal esophagus. Unchanged position of right IJ hemodialysis catheter, terminating at the superior cavoatrial junction. Defibrillator pads project over the chest. IMPRESSION: Unchanged appearance of the chest x-ray, with no evidence of edema or confluent airspace disease. Cardiomegaly. Unchanged endotracheal tube, right IJ hemodialysis catheter, defibrillator pads. Gastric tube projects over the mediastinum terminating out of the field of view, with the side-port in the distal esophagus.  Electronically Signed   By: Corrie Mckusick D.O.   On: 07/10/2017 10:35   Dg Chest Port 1 View  Result Date: 07/10/2017 CLINICAL DATA:  Respiratory failure EXAM: PORTABLE CHEST 1 VIEW COMPARISON:  07/08/2017 FINDINGS: Endotracheal tube stable. NG tube placed. The tip is at the gastroesophageal junction. The heart remains enlarged with a globular appearance. Right jugular dialysis catheter remains in place with its tip at the cavoatrial junction. Vascular congestion has improved. Clear lungs. No pneumothorax. IMPRESSION: Improved vascular congestion. NG tube placed with its tip at the gastroesophageal junction. This should be advanced. Electronically Signed   By: Marybelle Killings M.D.   On: 07/10/2017 09:36   Dg Chest Port 1 View  Result Date: 07/08/2017 CLINICAL DATA:  Verify endotracheal tube EXAM: PORTABLE CHEST 1 VIEW COMPARISON:  07/08/2017 FINDINGS: Endotracheal tube tip is about 5.4 cm superior to the carina. Right-sided central venous catheter tip overlies the right atrium. Cardiomegaly with mild central congestion. Aortic atherosclerosis. No pneumothorax. IMPRESSION: 1. Endotracheal tube tip about 5.4 cm superior to the carina 2. Cardiomegaly with vascular congestion Electronically Signed   By: Donavan Foil M.D.   On: 07/08/2017 22:56   Dg Chest Port 1 View  Result Date: 07/08/2017 CLINICAL DATA:  Dyspnea.  Cocaine abuse EXAM: PORTABLE CHEST 1 VIEW COMPARISON:  June 18, 2017 FINDINGS: The heart size and mediastinal contours are stable. The heart size is enlarged. Dual lumen central venous line is identified unchanged. Mild increased pulmonary interstitium is identified bilaterally. There is no focal pneumonia or pleural effusion. The visualized skeletal structures are unremarkable. IMPRESSION: Cardiomegaly.  Mild interstitial edema. Electronically Signed   By: Abelardo Diesel M.D.   On: 07/08/2017 19:36   Ct Angio Chest/abd/pel For Dissection W And/or W/wo  Result Date: 07/08/2017 CLINICAL  DATA:  56 year old male with acute respiratory illness. Shortness of breath and tachycardia. Dialysis patient. EXAM: CT ANGIOGRAPHY CHEST, ABDOMEN AND PELVIS TECHNIQUE: Multidetector CT imaging through the chest, abdomen and pelvis was performed using the standard protocol during bolus administration of intravenous contrast. Multiplanar reconstructed images and MIPs were obtained and reviewed to evaluate the vascular anatomy. CONTRAST:  ISOVUE-370 IOPAMIDOL (ISOVUE-370) INJECTION 76% COMPARISON:  Chest radiograph dated 07/08/2017 FINDINGS: Evaluation is limited due to streak artifact caused by patient's arms as well as anasarca. CTA CHEST FINDINGS Cardiovascular: There is moderate cardiomegaly. There is dilatation of the right atrium with retrograde flow of contrast into the IVC consistent with a degree of right heart dysfunction. Correlation with clinical exam and echocardiogram recommended. There is multi vessel coronary vascular calcification. Pericardial effusion measures 14 mm in thickness. There is advanced atherosclerotic calcification of the thoracic aorta. No aneurysmal dilatation or evidence of dissection. There is atherosclerotic calcification of the origins of the great vessels of the aortic arch. Evaluation of the pulmonary arteries is very limited due to suboptimal opacification and streak  artifact. No large embolus identified in the main pulmonary artery. Mediastinum/Nodes: Mildly enlarged bilateral hilar lymph nodes. No large mediastinal fluid collection. Lungs/Pleura: There are clusters of nodularity involving the lung bases, left greater right most likely representing an infectious process and developing infiltrate. No lobar consolidation, pleural effusion, or pneumothorax. The central airways are patent. Musculoskeletal: Mild diffuse osteosclerosis most consistent with renal osteodystrophy. Review of the MIP images confirms the above findings. CTA ABDOMEN AND PELVIS FINDINGS VASCULAR Aorta:  Advanced atherosclerotic disease. No aneurysmal dilatation or evidence of dissection. Celiac: There is atherosclerotic calcification of the celiac artery. The visualized portion of the celiac artery appears patent. SMA: Advanced atherosclerotic calcification of the SMA. The SMA remains patent. Renals: Advanced atherosclerotic calcification of the renal artery is. There is diminished flow in the renal arteries. IMA: Poorly visualized. Inflow: Advanced atherosclerotic calcification. The iliac arteries appear patent. Veins: Poorly visualized.  No portal venous gas. Review of the MIP images confirms the above findings. NON-VASCULAR No intra-abdominal free air.  Small ascites. Hepatobiliary: 30 limited evaluation of the liver. The liver appears enlarged measuring 18 cm in midclavicular length. The gallbladder is not well visualized. Pancreas: Not well visualized. Spleen: Poorly visualized grossly unremarkable. Adrenals/Urinary Tract: The adrenal glands are not well visualized. Bilateral renal atrophy and renovascular calcification. The urinary bladder is predominantly collapsed. Stomach/Bowel: There is redundancy of the sigmoid colon. There is moderate stool in the distal colon as well as in the rectal vault which may represent a degree of fecal impaction. Clinical correlation is recommended. Evaluation of the bowel is very limited in the absence of oral contrast and secondary to ascites and anasarca. No dilated small bowel loop identified. A faint and poorly visualized tubular structure medial to the cecum may represent a normal appendix. Lymphatic: Evaluation of the lymph nodes is very limited. Reproductive: The prostate and seminal vesicles are poorly visualized. Other: Diffuse edema and anasarca. Musculoskeletal: Osteosclerosis in keeping with renal osteodystrophy. No acute osseous pathology. Review of the MIP images confirms the above findings. IMPRESSION: 1. Very limited study due to streak artifact caused by  patient's arms and anasarca. No aortic aneurysm or dissection. 2. Moderate Aortic Atherosclerosis (ICD10-I70.0). 3. Bibasilar clusters of nodularity concerning for pneumonia. Clinical correlation is recommended. 4. Moderate cardiomegaly and multi vessel coronary vascular calcification and findings of right heart dysfunction correlation with clinical exam and echocardiogram recommended. Pericardial effusion measuring 14 mm in thickness. 5. Osteosclerosis in keeping with renal osteodystrophy. 6. Small ascites and anasarca. Electronically Signed   By: Anner Crete M.D.   On: 07/08/2017 22:01    STUDIES: Echo 06/01/17 >> mod LVH, severe MS, PAS 95 mmHg CT angio chest/abd/pelvis 07/08/17 >> atherosclerosis, b/l nodular clusters concerning for PNA, CM  CULTURES: Blood 12/28 >>  Respiratory viral panel 12/29 >> RSV  ANTIBIOTICS: Zosyn 12/28 >> Vancomycin 12/28 >> 12/29  LINES/TUBES: RT [place] HD catheter >>  ETT 12/28 >>   EVENTS: 12/24 Admit, split thickness skin graft Lt arm  12/28 VDRF 12/30 WCT, cardiology consulted  DISCUSSION: 56 yo male smoker with acute hypoxic respiratory failure from PNA with respiratory viral panel positive for RSV.  ASSESSMENT / PLAN:  Acute hypoxic respiratory failure. Viral pneumonitis with bacterial superinfection. Tobacco abuse. Hx of mild COPD. - pressure support wean as able >> might be able to extubate soon - day 3 of Abx - scheduled BDs  A fib with RVR. Wide complex tachycardia. Hx of CAD, HTN, severe MS, pulmonary HTN. - amiodarone - cardiology consulted  ESRD on HD. - per renal  Anemia of chronic disease. - f/u CBC  Acute metabolic encephalopathy. - RASS goal 0 to -1  DVT prophylaxis: SQ heparin SUP: Protonix Nutrition: Tube feeds Goals of care: Full code  Updated pt's sister at bedside  CC time 40 minutes  Chesley Mires, MD [facility] 07/10/2017, 11:31 AM Pager:  [phone] After 3pm call:  [phone]

## 2017-07-10 NOTE — Consult Note (Signed)
Reason for Consult: Hypotension, wide complex tachycardia, elevated troponin Referring Physician: [name]  [name] is an 56 y.o. male.  HPI:   56 y/o [place] American male with long standing hypertensive cardiomyopathy, CAD s/p mid RCA PCI 02/2017, end-stage renal disease on hemodialysis, chronic hepatitis B and C, COPD, tobacco and polysubstance abuse, dietary and medication noncompliance, tubular adenoma colon, was initially admitted on 07/04/2017 for defect on left arm secondary to skin necrosis and underwent skin graft placement. On 07/09/2017, he was noted to have increased work of breathing in spite of 3 L removal on hemodialysis. Critical care evaluated the patient. In spite of noninvasive strategies, his work of breathing and increased and the decided to proceed with intubation and mechanical ventilation. CT angiogram ruled out pulmonary embolism. It did reveal multifocal pneumonia for which he's been treated with appropriate antibiotics.  On 07/10/2017, at 3:54 AM, patient is documented to have had wide complex tachycardia albeit never lost pulses. At baseline, patient has atrial fibrillation with wide QRS complex. Review of the telemetry demonstrates atrial fibrillation in the setting of underlying wide complex due to LBBB that remains unchanged. It does not reveal ventricular tachycardia. Given his tachycardia and borderline blood pressure, he was cardioverted back to sinus rhythm. He is currently on amiodarone infusion. Norepinephrine infusion has been turned off. Blood work reveals persistent anemia, thrombocytopenia. Troponin is only mildly elevated at 0.32 ng per mL. He is febrile with max temperature of 103.5 F. He is tested positive for RSV.  At baseline, he is not on warfarin in spite of A. fib and high chest for score, due to his noncompliance. He is supposed to be on aspirin and Plavix given the recent PCI, but appears that he is not on Plavix during this hospital  admission.  Past Medical History:  Diagnosis Date  . Adenomatous colon polyp    tubular  . Anemia   . Anxiety   . Arthritis    left shoulder  . Atherosclerosis of aorta ([place])   . Cardiomegaly   . Chest pain    DATE UNKNOWN, C/O PERIODICALLY  . Cocaine abuse ([place])   . COPD exacerbation ([place]) 08/17/2016  . Coronary artery disease    stent 02/22/17  . Dialysis patient ([place])    Monday-Wednesday-Friday  . ESRD (end stage renal disease) on dialysis ([place])    "[name]; MWF" (07/04/2017)  . GERD (gastroesophageal reflux disease)    DATE UNKNOWN  . Hemorrhoids   . Hepatitis B, chronic ([place])   . Hepatitis C   . History of kidney stones   . Hyperkalemia   . Hypertension   . Metabolic bone disease    Patient denies  . Myocardial infarction ([place])   . Pneumonia   . Pulmonary edema   . Renal disorder   . Renal insufficiency   . Shortness of breath dyspnea    " for the last past year with this dialysis"  . Tubular adenoma of colon     Past Surgical History:  Procedure Laterality Date  . A/V FISTULAGRAM Left 05/26/2017   Procedure: A/V FISTULAGRAM;  Surgeon: Conrad Lewistown, MD;  Location: [facility] [facility];  Service: Cardiovascular;  Laterality: Left;  . APPLICATION OF WOUND VAC Left 06/14/2017   Procedure: APPLICATION OF WOUND VAC;  Surgeon: Katha Cabal, MD;  Location: [facility];  Service: Vascular;  Laterality: Left;  . AV FISTULA PLACEMENT  2012   BELIEVED WAS PLACED IN JUNE  . COLONOSCOPY    .  CORONARY STENT INTERVENTION N/A 02/22/2017   Procedure: CORONARY STENT INTERVENTION;  Surgeon: Nigel Mormon, MD;  Location: [place] [facility];  Service: Cardiovascular;  Laterality: N/A;  . HEMORRHOID BANDING    . I&D EXTREMITY Left 06/01/2017   Procedure: IRRIGATION AND DEBRIDEMENT LEFT ARM HEMATOMA WITH LIGATION OF LEFT ARM AV FISTULA;  Surgeon: Elam Dutch, MD;  Location: [place];  Service: Vascular;  Laterality: Left;  . I&D EXTREMITY Left 06/14/2017   Procedure:  IRRIGATION AND DEBRIDEMENT EXTREMITY;  Surgeon: Katha Cabal, MD;  Location: [facility];  Service: Vascular;  Laterality: Left;  . INSERTION OF DIALYSIS CATHETER  05/30/2017  . INSERTION OF DIALYSIS CATHETER N/A 05/30/2017   Procedure: INSERTION OF DIALYSIS CATHETER;  Surgeon: Elam Dutch, MD;  Location: [facility] OR;  Service: Vascular;  Laterality: N/A;  . LEFT HEART CATH AND CORONARY ANGIOGRAPHY N/A 02/22/2017   Procedure: LEFT HEART CATH AND CORONARY ANGIOGRAPHY;  Surgeon: Nigel Mormon, MD;  Location: [facility] [facility];  Service: Cardiovascular;  Laterality: N/A;  . LIGATION OF ARTERIOVENOUS  FISTULA Left 1/0/1751   Procedure: Plication of Left Arm Arteriovenous Fistula;  Surgeon: Elam Dutch, MD;  Location: [place];  Service: Vascular;  Laterality: Left;  . POLYPECTOMY    . REVISON OF ARTERIOVENOUS FISTULA Left 0/25/8527   Procedure: PLICATION OF DISTAL ANEURYSMAL SEGEMENT OF LEFT UPPER ARM ARTERIOVENOUS FISTULA;  Surgeon: Elam Dutch, MD;  Location: [place];  Service: Vascular;  Laterality: Left;  . REVISON OF ARTERIOVENOUS FISTULA Left 7/82/4235   Procedure: Plication of Left Upper Arm Fistula ;  Surgeon: Waynetta Sandy, MD;  Location: [place];  Service: Vascular;  Laterality: Left;  . SKIN GRAFT SPLIT THICKNESS LEG / FOOT Left    SKIN GRAFT SPLIT THICKNESS LEFT ARM DONOR SITE: LEFT ANTERIOR THIGH  . SKIN SPLIT GRAFT Left 07/04/2017   Procedure: SKIN GRAFT SPLIT THICKNESS LEFT ARM DONOR SITE: LEFT ANTERIOR THIGH;  Surgeon: Elam Dutch, MD;  Location: [place];  Service: Vascular;  Laterality: Left;  . THROMBECTOMY W/ EMBOLECTOMY Left 06/05/2017   Procedure: EXPLORATION OF LEFT ARM FOR BLEEDING; OVERSEWED PROXIMAL FISTULA;  Surgeon: Angelia Mould, MD;  Location: [place];  Service: Vascular;  Laterality: Left;  . WOUND EXPLORATION Left 06/03/2017   Procedure: WOUND EXPLORATION WITH WOUND VAC APPLICATION TO LEFT ARM;  Surgeon: Angelia Mould, MD;   Location: [facility] OR;  Service: Vascular;  Laterality: Left;    Family History  Problem Relation Age of Onset  . Heart disease Mother   . Lung cancer Mother   . Heart disease Father   . Malignant hyperthermia Father   . COPD Father   . Throat cancer Sister   . Esophageal cancer Sister   . Hypertension Other   . COPD Other   . Colon cancer Neg Hx   . Colon polyps Neg Hx   . Rectal cancer Neg Hx   . Stomach cancer Neg Hx     Social History:  reports that he has been smoking cigarettes.  He started smoking about 43 years ago. He has a 21.50 pack-year smoking history. he has never used smokeless tobacco. He reports that he drinks alcohol. He reports that he uses drugs. Drugs: Marijuana and Cocaine.  Allergies:  Allergies  Allergen Reactions  . Aspirin Other (See Comments)    STOMACH PAIN  . Clonidine Derivatives Itching  . Tramadol Itching    Medications: I have reviewed the patient's current medications.  No current facility-administered medications on file prior to encounter.    Current Outpatient Medications on File Prior to Encounter  Medication Sig Dispense Refill  . acetaminophen (TYLENOL) 325 MG tablet Take 2 tablets (650 mg total) by mouth every 6 (six) hours as needed for mild pain (or Fever >/= 101). 30 tablet 0  . budesonide-formoterol (SYMBICORT) 160-4.5 MCG/ACT inhaler Inhale 2 puffs into the lungs every 12 (twelve) hours.    . calcitRIOL (ROCALTROL) 0.25 MCG capsule Take 3 capsules (0.75 mcg total) by mouth every Monday, Wednesday, and Friday with hemodialysis.    Marland Kitchen diltiazem (CARDIZEM CD) 120 MG 24 hr capsule Take 1 capsule (120 mg total) by mouth daily. 30 capsule 1  . gabapentin (NEURONTIN) 100 MG capsule Take 100 mg 2 (two) times daily by mouth.    . isosorbide mononitrate (IMDUR) 30 MG 24 hr tablet Take 1 tablet (30 mg total) by mouth daily. 30 tablet 1  . Melatonin 5 MG TABS Take 5 mg by mouth at bedtime as needed (sleep).    . minoxidil (LONITEN) 2.5 MG tablet  Take 1 tablet (2.5 mg total) by mouth 2 (two) times daily. 60 tablet 0  . nicotine (NICODERM CQ - DOSED IN MG/24 HOURS) 14 mg/24hr patch Place 14 mg daily onto the skin.  0  . sevelamer carbonate (RENVELA) 800 MG tablet Take 4 tablets (3,200 mg total) by mouth 3 (three) times daily with meals.    Marland Kitchen zolpidem (AMBIEN) 10 MG tablet Take 1 tablet (10 mg total) by mouth at bedtime as needed for sleep.    . multivitamin (RENA-VIT) TABS tablet Take 1 tablet by mouth at bedtime. 30 each 0  . senna (SENOKOT) 8.6 MG TABS tablet Take 1 tablet (8.6 mg total) by mouth daily as needed for mild constipation.  0  . Spacer/Aero-Holding Chambers (AEROCHAMBER MV) inhaler Use as instructed 1 each 2  . vitamin C (VITAMIN C) 500 MG tablet Take 1 tablet (500 mg total) by mouth 2 (two) times daily. 60 tablet 0     Results for orders placed or performed during the hospital encounter of 07/04/17 (from the past 48 hour(s))  CBC     Status: Abnormal   Collection Time: 07/08/17  1:52 PM  Result Value Ref Range   WBC 10.5 4.0 - 10.5 K/uL   RBC 2.73 (L) 4.22 - 5.81 MIL/uL   Hemoglobin 7.2 (L) 13.0 - 17.0 g/dL   HCT 22.5 (L) 39.0 - 52.0 %   MCV 82.4 78.0 - 100.0 fL   MCH 26.4 26.0 - 34.0 pg   MCHC 32.0 30.0 - 36.0 g/dL   RDW 19.3 (H) 11.5 - 15.5 %   Platelets 119 (L) 150 - 400 K/uL    Comment: PLATELET COUNT CONFIRMED BY SMEAR  Renal function panel     Status: Abnormal   Collection Time: 07/08/17  1:52 PM  Result Value Ref Range   Sodium 132 (L) 135 - 145 mmol/L   Potassium 4.3 3.5 - 5.1 mmol/L   Chloride 94 (L) 101 - 111 mmol/L   CO2 28 22 - 32 mmol/L   Glucose, Bld 104 (H) 65 - 99 mg/dL   BUN 28 (H) 6 - 20 mg/dL   Creatinine, Ser 6.67 (H) 0.61 - 1.24 mg/dL   Calcium 9.0 8.9 - 10.3 mg/dL   Phosphorus 3.6 2.5 - 4.6 mg/dL   Albumin 2.9 (L) 3.5 - 5.0 g/dL   GFR calc non Af Amer 8 (L) >60 mL/min  GFR calc Af Amer 10 (L) >60 mL/min    Comment: (NOTE) The eGFR has been calculated using the CKD EPI  equation. This calculation has not been validated in all clinical situations. eGFR's persistently <60 mL/min signify possible Chronic Kidney Disease.    Anion gap 10 5 - 15  Blood gas, arterial     Status: Abnormal   Collection Time: 07/08/17  6:17 PM  Result Value Ref Range   O2 Content 6.0 L/min   Delivery systems NASAL CANNULA    pH, Arterial 7.449 7.350 - 7.450   pCO2 arterial 40.0 32.0 - 48.0 mmHg   pO2, Arterial 72.4 (L) 83.0 - 108.0 mmHg   Bicarbonate 26.5 20.0 - 28.0 mmol/L   Acid-Base Excess 3.2 (H) 0.0 - 2.0 mmol/L   O2 Saturation 91.6 %   Patient temperature 103.3    Collection site LEFT RADIAL    Drawn by [phone]    Sample type ARTERIAL DRAW    Allens test (pass/fail) PASS PASS  Basic metabolic panel     Status: Abnormal   Collection Time: 07/08/17  7:59 PM  Result Value Ref Range   Sodium 134 (L) 135 - 145 mmol/L   Potassium 4.7 3.5 - 5.1 mmol/L   Chloride 95 (L) 101 - 111 mmol/L   CO2 26 22 - 32 mmol/L   Glucose, Bld 97 65 - 99 mg/dL   BUN 23 (H) 6 - 20 mg/dL   Creatinine, Ser 5.77 (H) 0.61 - 1.24 mg/dL   Calcium 9.6 8.9 - 10.3 mg/dL   GFR calc non Af Amer 10 (L) >60 mL/min   GFR calc Af Amer 12 (L) >60 mL/min    Comment: (NOTE) The eGFR has been calculated using the CKD EPI equation. This calculation has not been validated in all clinical situations. eGFR's persistently <60 mL/min signify possible Chronic Kidney Disease.    Anion gap 13 5 - 15  CBC     Status: Abnormal   Collection Time: 07/08/17  7:59 PM  Result Value Ref Range   WBC 10.8 (H) 4.0 - 10.5 K/uL   RBC 2.77 (L) 4.22 - 5.81 MIL/uL   Hemoglobin 7.6 (L) 13.0 - 17.0 g/dL   HCT 23.2 (L) 39.0 - 52.0 %   MCV 83.8 78.0 - 100.0 fL   MCH 27.4 26.0 - 34.0 pg   MCHC 32.8 30.0 - 36.0 g/dL   RDW 19.9 (H) 11.5 - 15.5 %   Platelets 121 (L) 150 - 400 K/uL  Brain natriuretic peptide     Status: Abnormal   Collection Time: 07/08/17  7:59 PM  Result Value Ref Range   B Natriuretic Peptide 1,100.4 (H) 0.0  - 100.0 pg/mL  D-dimer, quantitative (not at [facility])     Status: Abnormal   Collection Time: 07/08/17  7:59 PM  Result Value Ref Range   D-Dimer, Quant >20.00 (H) 0.00 - 0.50 ug/mL-FEU    Comment: REPEATED TO VERIFY SPECIMEN CHECKED FOR CLOTS (NOTE) At the manufacturer cut-off of 0.50 ug/mL FEU, this assay has been documented to exclude PE with a sensitivity and negative predictive value of 97 to 99%.  At this time, this assay has not been approved by the FDA to exclude DVT/VTE. Results should be correlated with clinical presentation.   Culture, blood (Routine X 2) w Reflex to ID Panel     Status: None (Preliminary result)   Collection Time: 07/08/17  8:00 PM  Result Value Ref Range   Specimen Description BLOOD RIGHT ANTECUBITAL  Special Requests      BOTTLES DRAWN AEROBIC ONLY Blood Culture adequate volume IV SITE   Culture NO GROWTH < 24 HOURS    Report Status PENDING   Culture, blood (Routine X 2) w Reflex to ID Panel     Status: None (Preliminary result)   Collection Time: 07/08/17  8:13 PM  Result Value Ref Range   Specimen Description BLOOD RIGHT HAND    Special Requests      BOTTLES DRAWN AEROBIC ONLY Blood Culture adequate volume   Culture NO GROWTH < 24 HOURS    Report Status PENDING   I-STAT 3, arterial blood gas (G3+)     Status: Abnormal   Collection Time: 07/08/17 11:57 PM  Result Value Ref Range   pH, Arterial 7.437 7.350 - 7.450   pCO2 arterial 43.1 32.0 - 48.0 mmHg   pO2, Arterial 439.0 (H) 83.0 - 108.0 mmHg   Bicarbonate 29.1 (H) 20.0 - 28.0 mmol/L   TCO2 30 22 - 32 mmol/L   O2 Saturation 100.0 %   Acid-Base Excess 4.0 (H) 0.0 - 2.0 mmol/L   Patient temperature 98.6 F    Collection site RADIAL, ALLEN'S TEST ACCEPTABLE    Drawn by RT    Sample type ARTERIAL   Respiratory Panel by PCR     Status: Abnormal   Collection Time: 07/09/17  2:16 AM  Result Value Ref Range   Adenovirus NOT DETECTED NOT DETECTED   Coronavirus 229E NOT DETECTED NOT DETECTED    Coronavirus HKU1 NOT DETECTED NOT DETECTED   Coronavirus NL63 NOT DETECTED NOT DETECTED   Coronavirus OC43 NOT DETECTED NOT DETECTED   Metapneumovirus NOT DETECTED NOT DETECTED   Rhinovirus / Enterovirus NOT DETECTED NOT DETECTED   Influenza A NOT DETECTED NOT DETECTED   Influenza B NOT DETECTED NOT DETECTED   Parainfluenza Virus 1 NOT DETECTED NOT DETECTED   Parainfluenza Virus 2 NOT DETECTED NOT DETECTED   Parainfluenza Virus 3 NOT DETECTED NOT DETECTED   Parainfluenza Virus 4 NOT DETECTED NOT DETECTED   Respiratory Syncytial Virus DETECTED (A) NOT DETECTED    Comment: CRITICAL RESULT CALLED TO, READ BACK BY AND VERIFIED WITHArloa Koh RN [id number] 07/09/17 A BROWNING    Bordetella pertussis NOT DETECTED NOT DETECTED   Chlamydophila pneumoniae NOT DETECTED NOT DETECTED   Mycoplasma pneumoniae NOT DETECTED NOT DETECTED  Vancomycin, random     Status: None   Collection Time: 07/09/17  4:56 AM  Result Value Ref Range   Vancomycin Rm 25     Comment:        Random Vancomycin therapeutic range is dependent on dosage and time of specimen collection. A peak range is 20.0-40.0 ug/mL A trough range is 5.0-15.0 ug/mL          Magnesium     Status: None   Collection Time: 07/09/17 10:42 AM  Result Value Ref Range   Magnesium 2.2 1.7 - 2.4 mg/dL  Phosphorus     Status: Abnormal   Collection Time: 07/09/17 10:42 AM  Result Value Ref Range   Phosphorus 5.9 (H) 2.5 - 4.6 mg/dL  Procalcitonin - Baseline     Status: None   Collection Time: 07/09/17 10:42 AM  Result Value Ref Range   Procalcitonin 5.63 ng/mL    Comment:        Interpretation: PCT > 2 ng/mL: Systemic infection (sepsis) is likely, unless other causes are known. (NOTE)       Sepsis PCT Algorithm  Lower Respiratory Tract                                      Infection PCT Algorithm    ----------------------------     ----------------------------         PCT < 0.25 ng/mL                PCT < 0.10 ng/mL          Strongly encourage             Strongly discourage   discontinuation of antibiotics    initiation of antibiotics    ----------------------------     -----------------------------       PCT 0.25 - 0.50 ng/mL            PCT 0.10 - 0.25 ng/mL               OR       >80% decrease in PCT            Discourage initiation of                                            antibiotics      Encourage discontinuation           of antibiotics    ----------------------------     -----------------------------         PCT >= 0.50 ng/mL              PCT 0.26 - 0.50 ng/mL               AND       <80% decrease in PCT              Encourage initiation of                                             antibiotics       Encourage continuation           of antibiotics    ----------------------------     -----------------------------        PCT >= 0.50 ng/mL                  PCT > 0.50 ng/mL               AND         increase in PCT                  Strongly encourage                                      initiation of antibiotics    Strongly encourage escalation           of antibiotics                                     -----------------------------  PCT <= 0.25 ng/mL                                                 OR                                        > 80% decrease in PCT                                     Discontinue / Do not initiate                                             antibiotics   Glucose, capillary     Status: None   Collection Time: 07/09/17 12:06 PM  Result Value Ref Range   Glucose-Capillary 87 65 - 99 mg/dL   Comment 1 Notify RN   Magnesium     Status: None   Collection Time: 07/09/17  4:28 PM  Result Value Ref Range   Magnesium 2.2 1.7 - 2.4 mg/dL  Phosphorus     Status: Abnormal   Collection Time: 07/09/17  4:28 PM  Result Value Ref Range   Phosphorus 5.4 (H) 2.5 - 4.6 mg/dL  Glucose, capillary     Status: Abnormal   Collection Time:  07/09/17  4:34 PM  Result Value Ref Range   Glucose-Capillary 106 (H) 65 - 99 mg/dL   Comment 1 Notify RN   Glucose, capillary     Status: Abnormal   Collection Time: 07/09/17  7:32 PM  Result Value Ref Range   Glucose-Capillary 121 (H) 65 - 99 mg/dL  Magnesium     Status: None   Collection Time: 07/10/17  3:21 AM  Result Value Ref Range   Magnesium 2.3 1.7 - 2.4 mg/dL  Phosphorus     Status: Abnormal   Collection Time: 07/10/17  3:21 AM  Result Value Ref Range   Phosphorus 5.9 (H) 2.5 - 4.6 mg/dL  Renal function panel     Status: Abnormal   Collection Time: 07/10/17  3:21 AM  Result Value Ref Range   Sodium 133 (L) 135 - 145 mmol/L   Potassium 5.6 (H) 3.5 - 5.1 mmol/L   Chloride 95 (L) 101 - 111 mmol/L   CO2 21 (L) 22 - 32 mmol/L   Glucose, Bld 118 (H) 65 - 99 mg/dL   BUN 43 (H) 6 - 20 mg/dL   Creatinine, Ser 7.47 (H) 0.61 - 1.24 mg/dL   Calcium 9.3 8.9 - 10.3 mg/dL   Phosphorus 5.9 (H) 2.5 - 4.6 mg/dL   Albumin 3.0 (L) 3.5 - 5.0 g/dL   GFR calc non Af Amer 7 (L) >60 mL/min   GFR calc Af Amer 9 (L) >60 mL/min    Comment: (NOTE) The eGFR has been calculated using the CKD EPI equation. This calculation has not been validated in all clinical situations. eGFR's persistently <60 mL/min signify possible Chronic Kidney Disease.    Anion gap 17 (H) 5 - 15  CBC     Status: Abnormal   Collection Time: 07/10/17  3:21 AM  Result Value Ref Range   WBC 9.8 4.0 - 10.5 K/uL   RBC 3.08 (L) 4.22 - 5.81 MIL/uL   Hemoglobin 8.3 (L) 13.0 - 17.0 g/dL   HCT 25.4 (L) 39.0 - 52.0 %   MCV 82.5 78.0 - 100.0 fL   MCH 26.9 26.0 - 34.0 pg   MCHC 32.7 30.0 - 36.0 g/dL   RDW 20.9 (H) 11.5 - 15.5 %   Platelets 164 150 - 400 K/uL  Glucose, capillary     Status: Abnormal   Collection Time: 07/10/17  3:30 AM  Result Value Ref Range   Glucose-Capillary 135 (H) 65 - 99 mg/dL  Basic metabolic panel     Status: Abnormal   Collection Time: 07/10/17  4:04 AM  Result Value Ref Range   Sodium 135 135 -  145 mmol/L   Potassium 4.3 3.5 - 5.1 mmol/L    Comment: DELTA CHECK NOTED   Chloride 101 101 - 111 mmol/L   CO2 21 (L) 22 - 32 mmol/L   Glucose, Bld 117 (H) 65 - 99 mg/dL   BUN 38 (H) 6 - 20 mg/dL   Creatinine, Ser 6.54 (H) 0.61 - 1.24 mg/dL   Calcium 8.1 (L) 8.9 - 10.3 mg/dL   GFR calc non Af Amer 9 (L) >60 mL/min   GFR calc Af Amer 10 (L) >60 mL/min    Comment: (NOTE) The eGFR has been calculated using the CKD EPI equation. This calculation has not been validated in all clinical situations. eGFR's persistently <60 mL/min signify possible Chronic Kidney Disease.    Anion gap 13 5 - 15  CBC     Status: Abnormal   Collection Time: 07/10/17  4:04 AM  Result Value Ref Range   WBC 7.5 4.0 - 10.5 K/uL   RBC 2.65 (L) 4.22 - 5.81 MIL/uL   Hemoglobin 7.1 (L) 13.0 - 17.0 g/dL   HCT 21.8 (L) 39.0 - 52.0 %   MCV 82.3 78.0 - 100.0 fL   MCH 26.8 26.0 - 34.0 pg   MCHC 32.6 30.0 - 36.0 g/dL   RDW 21.1 (H) 11.5 - 15.5 %   Platelets 139 (L) 150 - 400 K/uL  Troponin I     Status: Abnormal   Collection Time: 07/10/17  4:04 AM  Result Value Ref Range   Troponin I 0.32 (HH) <0.03 ng/mL    Comment: CRITICAL RESULT CALLED TO, READ BACK BY AND VERIFIED WITH: PERRIN,J RN 07/10/2017 0518 JORD[place]   Blood gas, arterial     Status: Abnormal   Collection Time: 07/10/17  5:39 AM  Result Value Ref Range   FIO2 100.00    Delivery systems VENTILATOR    Mode PRESSURE REGULATED VOLUME CONTROL    VT 570 mL   LHR 16 resp/min   Peep/cpap 5.0 cm H20   pH, Arterial 7.364 7.350 - 7.450   pCO2 arterial 43.0 32.0 - 48.0 mmHg   pO2, Arterial 416 (H) 83.0 - 108.0 mmHg   Bicarbonate 23.9 20.0 - 28.0 mmol/L   Acid-base deficit 0.7 0.0 - 2.0 mmol/L   O2 Saturation 99.9 %   Patient temperature 98.6    Collection site A-LINE    Drawn by [id number]    Sample type ARTERIAL DRAW    Allens test (pass/fail) PASS PASS  Type and screen [place]     Status: None   Collection Time: 07/10/17  5:57 AM   Result Value Ref Range   ABO/RH(D) Jenetta Downer  NEG    Antibody Screen NEG    Sample Expiration 07/13/2017   Renal function panel     Status: Abnormal   Collection Time: 07/10/17  7:05 AM  Result Value Ref Range   Sodium 134 (L) 135 - 145 mmol/L   Potassium 4.8 3.5 - 5.1 mmol/L   Chloride 96 (L) 101 - 111 mmol/L   CO2 23 22 - 32 mmol/L   Glucose, Bld 128 (H) 65 - 99 mg/dL   BUN 43 (H) 6 - 20 mg/dL   Creatinine, Ser 7.43 (H) 0.61 - 1.24 mg/dL   Calcium 9.3 8.9 - 10.3 mg/dL   Phosphorus 5.5 (H) 2.5 - 4.6 mg/dL   Albumin 2.8 (L) 3.5 - 5.0 g/dL   GFR calc non Af Amer 7 (L) >60 mL/min   GFR calc Af Amer 9 (L) >60 mL/min    Comment: (NOTE) The eGFR has been calculated using the CKD EPI equation. This calculation has not been validated in all clinical situations. eGFR's persistently <60 mL/min signify possible Chronic Kidney Disease.    Anion gap 15 5 - 15  Magnesium     Status: None   Collection Time: 07/10/17  7:06 AM  Result Value Ref Range   Magnesium 2.4 1.7 - 2.4 mg/dL  APTT     Status: Abnormal   Collection Time: 07/10/17  7:06 AM  Result Value Ref Range   aPTT 44 (H) 24 - 36 seconds    Comment:        IF BASELINE aPTT IS ELEVATED, SUGGEST PATIENT RISK ASSESSMENT BE USED TO DETERMINE APPROPRIATE ANTICOAGULANT THERAPY.   Glucose, capillary     Status: Abnormal   Collection Time: 07/10/17  8:18 AM  Result Value Ref Range   Glucose-Capillary 125 (H) 65 - 99 mg/dL   Comment 1 Notify RN     Dg Chest Port 1 View  Result Date: 07/10/2017 CLINICAL DATA:  Respiratory failure EXAM: PORTABLE CHEST 1 VIEW COMPARISON:  07/08/2017 FINDINGS: Endotracheal tube stable. NG tube placed. The tip is at the gastroesophageal junction. The heart remains enlarged with a globular appearance. Right jugular dialysis catheter remains in place with its tip at the cavoatrial junction. Vascular congestion has improved. Clear lungs. No pneumothorax. IMPRESSION: Improved vascular congestion. NG tube placed  with its tip at the gastroesophageal junction. This should be advanced. Electronically Signed   By: Marybelle Killings M.D.   On: 07/10/2017 09:36   Dg Chest Port 1 View  Result Date: 07/08/2017 CLINICAL DATA:  Verify endotracheal tube EXAM: PORTABLE CHEST 1 VIEW COMPARISON:  07/08/2017 FINDINGS: Endotracheal tube tip is about 5.4 cm superior to the carina. Right-sided central venous catheter tip overlies the right atrium. Cardiomegaly with mild central congestion. Aortic atherosclerosis. No pneumothorax. IMPRESSION: 1. Endotracheal tube tip about 5.4 cm superior to the carina 2. Cardiomegaly with vascular congestion Electronically Signed   By: Donavan Foil M.D.   On: 07/08/2017 22:56   Dg Chest Port 1 View  Result Date: 07/08/2017 CLINICAL DATA:  Dyspnea.  Cocaine abuse EXAM: PORTABLE CHEST 1 VIEW COMPARISON:  June 18, 2017 FINDINGS: The heart size and mediastinal contours are stable. The heart size is enlarged. Dual lumen central venous line is identified unchanged. Mild increased pulmonary interstitium is identified bilaterally. There is no focal pneumonia or pleural effusion. The visualized skeletal structures are unremarkable. IMPRESSION: Cardiomegaly.  Mild interstitial edema. Electronically Signed   By: Abelardo Diesel M.D.   On: 07/08/2017 19:36   Ct Angio Chest/abd/pel For  Dissection W And/or W/wo  Result Date: 07/08/2017 CLINICAL DATA:  56 year old male with acute respiratory illness. Shortness of breath and tachycardia. Dialysis patient. EXAM: CT ANGIOGRAPHY CHEST, ABDOMEN AND PELVIS TECHNIQUE: Multidetector CT imaging through the chest, abdomen and pelvis was performed using the standard protocol during bolus administration of intravenous contrast. Multiplanar reconstructed images and MIPs were obtained and reviewed to evaluate the vascular anatomy. CONTRAST:  ISOVUE-370 IOPAMIDOL (ISOVUE-370) INJECTION 76% COMPARISON:  Chest radiograph dated 07/08/2017 FINDINGS: Evaluation is limited due to  streak artifact caused by patient's arms as well as anasarca. CTA CHEST FINDINGS Cardiovascular: There is moderate cardiomegaly. There is dilatation of the right atrium with retrograde flow of contrast into the IVC consistent with a degree of right heart dysfunction. Correlation with clinical exam and echocardiogram recommended. There is multi vessel coronary vascular calcification. Pericardial effusion measures 14 mm in thickness. There is advanced atherosclerotic calcification of the thoracic aorta. No aneurysmal dilatation or evidence of dissection. There is atherosclerotic calcification of the origins of the great vessels of the aortic arch. Evaluation of the pulmonary arteries is very limited due to suboptimal opacification and streak artifact. No large embolus identified in the main pulmonary artery. Mediastinum/Nodes: Mildly enlarged bilateral hilar lymph nodes. No large mediastinal fluid collection. Lungs/Pleura: There are clusters of nodularity involving the lung bases, left greater right most likely representing an infectious process and developing infiltrate. No lobar consolidation, pleural effusion, or pneumothorax. The central airways are patent. Musculoskeletal: Mild diffuse osteosclerosis most consistent with renal osteodystrophy. Review of the MIP images confirms the above findings. CTA ABDOMEN AND PELVIS FINDINGS VASCULAR Aorta: Advanced atherosclerotic disease. No aneurysmal dilatation or evidence of dissection. Celiac: There is atherosclerotic calcification of the celiac artery. The visualized portion of the celiac artery appears patent. SMA: Advanced atherosclerotic calcification of the SMA. The SMA remains patent. Renals: Advanced atherosclerotic calcification of the renal artery is. There is diminished flow in the renal arteries. IMA: Poorly visualized. Inflow: Advanced atherosclerotic calcification. The iliac arteries appear patent. Veins: Poorly visualized.  No portal venous gas. Review of  the MIP images confirms the above findings. NON-VASCULAR No intra-abdominal free air.  Small ascites. Hepatobiliary: 30 limited evaluation of the liver. The liver appears enlarged measuring 18 cm in midclavicular length. The gallbladder is not well visualized. Pancreas: Not well visualized. Spleen: Poorly visualized grossly unremarkable. Adrenals/Urinary Tract: The adrenal glands are not well visualized. Bilateral renal atrophy and renovascular calcification. The urinary bladder is predominantly collapsed. Stomach/Bowel: There is redundancy of the sigmoid colon. There is moderate stool in the distal colon as well as in the rectal vault which may represent a degree of fecal impaction. Clinical correlation is recommended. Evaluation of the bowel is very limited in the absence of oral contrast and secondary to ascites and anasarca. No dilated small bowel loop identified. A faint and poorly visualized tubular structure medial to the cecum may represent a normal appendix. Lymphatic: Evaluation of the lymph nodes is very limited. Reproductive: The prostate and seminal vesicles are poorly visualized. Other: Diffuse edema and anasarca. Musculoskeletal: Osteosclerosis in keeping with renal osteodystrophy. No acute osseous pathology. Review of the MIP images confirms the above findings. IMPRESSION: 1. Very limited study due to streak artifact caused by patient's arms and anasarca. No aortic aneurysm or dissection. 2. Moderate Aortic Atherosclerosis (ICD10-I70.0). 3. Bibasilar clusters of nodularity concerning for pneumonia. Clinical correlation is recommended. 4. Moderate cardiomegaly and multi vessel coronary vascular calcification and findings of right heart dysfunction correlation with clinical exam and echocardiogram recommended.  Pericardial effusion measuring 14 mm in thickness. 5. Osteosclerosis in keeping with renal osteodystrophy. 6. Small ascites and anasarca. Electronically Signed   By: Anner Crete M.D.   On:  07/08/2017 22:01   EKG 07/05/2017: Atrial fibrillation, left bundle branch block.  Review of Systems  Unable to perform ROS: Intubated   Blood pressure 116/63, pulse (!) 102, temperature 98.6 F (37 C), temperature source Oral, resp. rate (!) 24, height 5' 9" (1.753 m), weight 75 kg (165 lb 5.5 oz), SpO2 100 %. Physical Exam  Nursing note and vitals reviewed. Constitutional: He appears well-developed.  HENT:  Head: Normocephalic and atraumatic.  Eyes: Pupils are equal, round, and reactive to light.  Neck: Normal range of motion. JVD present.  Cardiovascular: Normal rate.  Murmur (III/IV apical holosystolic murmur) heard. Irregularly irregular rhythm Diminished distal pulses  Respiratory: He has wheezes. He has no rales.  GI: Bowel sounds are normal. He exhibits distension. There is no tenderness.  Musculoskeletal: He exhibits edema (2+).  Unable to perform range of motion exam.  Lymphadenopathy:    He has no cervical adenopathy.  Neurological:  Intubated and sedated. No detailed neurological exam performed. Bilaterally equal pupils.  Skin: Skin is warm and dry. No erythema.  Psychiatric:  Unable to assess.     CLINICAL DATA:  Abdominal distention.  EXAM: ABDOMEN - 1 VIEW  COMPARISON:  04/23/2017  FINDINGS: There is gaseous distension of the colon. Moderate stool burden identified within the rectum. No abnormal small bowel dilatation.  IMPRESSION: 1. Gaseous distension of the colon with moderate stool burden in the rectum. Correlate for any clinical signs or symptoms of rectal impaction or constipation.   CTA chest/abdomen 07/08/2017: IMPRESSION: 1. Very limited study due to streak artifact caused by patient's arms and anasarca. No aortic aneurysm or dissection. 2. Moderate Aortic Atherosclerosis (ICD10-I70.0). 3. Bibasilar clusters of nodularity concerning for pneumonia. Clinical correlation is recommended. 4. Moderate cardiomegaly and multi vessel  coronary vascular calcification and findings of right heart dysfunction correlation with clinical exam and echocardiogram recommended. Pericardial effusion measuring 14 mm in thickness. 5. Osteosclerosis in keeping with renal osteodystrophy. 6. Small ascites and anasarca.  Study Conclusions  - Left ventricle: The cavity size was normal. Wall thickness was   increased in a pattern of moderate LVH. The study is not   technically sufficient to allow evaluation of LV diastolic   function. - Mitral valve: Calcified annulus and basal leaflets severe MS   based on gradients. Moderately calcified annulus. Moderately   thickened leaflets . There was mild regurgitation. - Left atrium: The atrium was moderately dilated. - Right ventricle: The cavity size was moderately dilated. - Right atrium: The atrium was moderately dilated. - Atrial septum: No defect or patent foramen ovale was identified. - Tricuspid valve: There was severe regurgitation. - Pulmonary arteries: PA peak pressure: 95 mm Hg (S). - Pericardium, extracardiac: Small posterior pericardial effusion  Assessment: Acute hypoxic respiratory failure Bilateral viral pneumonia, RSV positive A. fib with RVR Mild troponin elevation: Chronic myocardial injury, not MI CAD status post mid RCA PCI August 2018 Grp II Pulmonary hypertension Recent left arm AV fistula ligation, skin necrosis End-stage renal disease on hemodialysis Anemia of chronic disease COPD Tobacco and polysubstance abuse   Recommendations: May been off amiodarone infusion by tomorrow after starting PO metoprolol titrate 25 mg twice daily through OG tube. High CHA2DS2VASc score, but not on warfarin at baseline due to noncompliance.  Recommend plavix given recent PCI. Management of acute respiratory failure  and possible sepsis as per critical care team.   J  07/10/2017, 10:22 AM   Nigel Mormon, MD [facility] [place] Pager:  [phone] Office: [phone] If no answer Cell [phone]

## 2017-07-10 NOTE — Procedures (Signed)
Arterial Catheter Insertion Procedure Note [name] [id number] [birth year]  Procedure: Insertion of Arterial Catheter  Indications: Blood pressure monitoring and Frequent blood sampling  Procedure Details Consent: Unable to obtain consent because of altered level of consciousness. Time Out: Verified patient identification, verified procedure, site/side was marked, verified correct patient position, special equipment/implants available, medications/allergies/relevent history reviewed, required imaging and test results available.  Performed  Maximum sterile technique was used including antiseptics, cap, gloves, gown, hand hygiene, mask and sheet. Skin prep: Chlorhexidine; local anesthetic administered 20 gauge catheter was inserted into right radial artery using the Seldinger technique.  Evaluation Blood flow good; BP tracing good. Complications: No apparent complications.  Normal saline flush used.  Line leveled and zeroed, site cleansed and dressed per RT protocol.    Lacretia Nicks 07/10/2017

## 2017-07-10 NOTE — Progress Notes (Signed)
Wide complex rhythm with what looked to be vtach noted on bedside monitor.  Pt with agonal type respirations.  Not responsive.  ELink MD notified.  Orders received.  Will continue to closely monitor.

## 2017-07-10 NOTE — Progress Notes (Signed)
   Daily Progress Note   Assessment/Planning:   POD #6 s/p L STSG   Appreciate PCCM assistance  Neuro: sed for vent  Pulm: possible PNA, abx: vanco, Zosyn, on vent (mgmt per PCCM)  CV: will need to get Cardio c/s, on light dose of NE drip and amio  GI: NGT to suction, get KUB, hold TF for now  FEN: may need fluid resuscitation if hypotension is persistent  REN: HD in process  HEME: may need transfusion, prefer to give pRBC rater than fluid  ID: on broad spectrum for presumed PNA   Subjective  - 6 Days Post-Op   Overnight events noted, vasopressor started   Objective   Vitals:   07/10/17 0615 07/10/17 0630 07/10/17 0645 07/10/17 0821  BP: (!) 84/64     Pulse:      Resp: 15 (!) 25 15   Temp:    98.6 F (37 C)  TempSrc:    Oral  SpO2: 100% 100% 100%   Weight:      Height:         Intake/Output Summary (Last 24 hours) at 07/10/2017 8366 Last data filed at 07/10/2017 0700 Gross per 24 hour  Intake 2926.81 ml  Output 68 ml  Net 2858.81 ml   PULM  CTAB upper fields, rhonchi LLL  CV  Tachycardiac, variable rhythm  GI  soft, NTND  VASC L arm bandaged, L thigh Xeroform adherent  NEURO Sedated on Fentanyl drip    Laboratory   CBC CBC Latest Ref Rng & Units 07/10/2017 07/10/2017 07/08/2017  WBC 4.0 - 10.5 K/uL 7.5 9.8 10.8(H)  Hemoglobin 13.0 - 17.0 g/dL 7.1(L) 8.3(L) 7.6(L)  Hematocrit 39.0 - 52.0 % 21.8(L) 25.4(L) 23.2(L)  Platelets 150 - 400 K/uL 139(L) 164 121(L)    BMET    Component Value Date/Time   NA 134 (L) 07/10/2017 0705   K 4.8 07/10/2017 0705   CL 96 (L) 07/10/2017 0705   CO2 23 07/10/2017 0705   GLUCOSE 128 (H) 07/10/2017 0705   BUN 43 (H) 07/10/2017 0705   CREATININE 7.43 (H) 07/10/2017 0705   CREATININE 9.18 (H) 02/15/2017 1425   CALCIUM 9.3 07/10/2017 0705   CALCIUM 8.1 (L) 07/11/2011 1117   GFRNONAA 7 (L) 07/10/2017 0705   GFRNONAA 6 (L) 02/15/2017 1425   GFRAA 9 (L) 07/10/2017 0705   GFRAA 7 (L) 02/15/2017 [place], MD, FACS [facility] Office: [phone] Pager: [phone]  07/10/2017, 8:21 AM

## 2017-07-10 NOTE — Progress Notes (Signed)
See Dr. Lianne Moris not from earlier this morning.  Dressing removed from left upper arm.  Skin graft is healing nicely.  Will continue dressing changes daily by MD/PA.  Bacitracin over wound followed by Xeroform, 4x4's, kerlix and gentle ace wrap daily.  Leontine Locket, [facility] 07/10/2017 9:36 AM

## 2017-07-10 NOTE — Progress Notes (Signed)
EVENT NOTE   .. ..  Name: Imari Sivertsen MRN: [id number] DOB: 1961-02-16    ADMISSION DATE:  07/04/2017 UNSTABLE RHYTHM  Called to Bedside by Margaree Mackintosh At 3:54 AM patient went into what appeared to be a wide complex tachycardia Pt never lost pulses PADS were in place He received Calcium 2g IV and an Amiodarone Bolus, pan labs drawn prior to my arrival and was still in this irregular wide complex tachyarrhythmia Mg 2g IV was given and Lidocaine  Reviewed previous labs and chart- pt had not received HD since Friday Cardiology at Bedside-> Pt was defibrillated and went into sinus tach At 1:25 AM pt's systolic<60 started on levophed ggt via central access in RIJ   Signed Dr Seward Carol Pulmonary Critical Care [facility] and [place] Pager: [phone]  07/10/2017, 4:22 AM

## 2017-07-10 NOTE — Procedures (Signed)
Extubation Procedure Note  Patient Details:   Name: [name] DOB: Oct 24, 1960 MRN: [id number]   Airway Documentation:     Evaluation  O2 sats: stable throughout Complications: No apparent complications Patient did tolerate procedure well. Bilateral Breath Sounds: Expiratory wheezes   Yes   Patient extubated to [place]. Vital signs stable at this time. RN at bedside.  Patient tolerated well. RT will continue to monitor.  Mcneil Sober 07/10/2017, 2:10 PM

## 2017-07-10 NOTE — Progress Notes (Signed)
Pharmacy Antibiotic Note  [name] is a 56 y.o. male admitted with SOB and cough. Pt is ESRD on HD - MWF, now transitioning to CRRT. Pharmacy to dose Vancomycin and Zosyn.   Temp 97.2, WBC 7.5, and PCT 5.63.   Patient has previously received Vancomycin loading dose 1.5g IV x1.   Plan: Transition to Zosyn 3.375 g IV q6h (30 min infusion) Random vancomycin level at 6PM  Vancomycin 10mg /kg q24hr - to be timed based on random vancomycin level  Goal vancomycin trough 15-20 mcg/mL Monitor CRRT tolerance, clinical picture, and culture data  Height: 5\' 9"  (175.3 cm) Weight: 165 lb 5.5 oz (75 kg) IBW/kg (Calculated) : 70.7  Temp (24hrs), Avg:101.9 F (38.8 C), Min:97.2 F (36.2 C), Max:103.5 F (39.7 C)  Recent Labs  Lab 07/04/17 1757 07/05/17 0233 07/06/17 0738 07/08/17 1352 07/08/17 1959 07/09/17 0456 07/10/17 0321 07/10/17 0404  WBC 8.1  --   --  10.5 10.8*  --  9.8 7.5  CREATININE 6.26* 6.86* 9.26* 6.67* 5.77*  --  7.47*  --   VANCORANDOM  --   --   --   --   --  25  --   --     Estimated Creatinine Clearance: 11.3 mL/min (A) (by C-G formula based on SCr of 7.47 mg/dL (H)).    Allergies  Allergen Reactions  . Aspirin Other (See Comments)    STOMACH PAIN  . Clonidine Derivatives Itching  . Tramadol Itching   Antimicrobials this admission: 12/28 vancomycin > 12/28 zosyn >  Dose adjustments this admission: 12/29 VR: 25 mcg/ml > no adjustments  Microbiology results: 12/28 blood cx: ngtd  12/29 Resp panel: RSV   Lavonda Jumbo, PharmD Clinical Pharmacist 07/10/17 5:18 AM

## 2017-07-11 DIAGNOSIS — J189 Pneumonia, unspecified organism: Secondary | ICD-10-CM

## 2017-07-11 DIAGNOSIS — I472 Ventricular tachycardia: Secondary | ICD-10-CM

## 2017-07-11 DIAGNOSIS — I120 Hypertensive chronic kidney disease with stage 5 chronic kidney disease or end stage renal disease: Secondary | ICD-10-CM | POA: Diagnosis not present

## 2017-07-11 DIAGNOSIS — Z992 Dependence on renal dialysis: Secondary | ICD-10-CM | POA: Diagnosis not present

## 2017-07-11 DIAGNOSIS — N186 End stage renal disease: Secondary | ICD-10-CM | POA: Diagnosis not present

## 2017-07-11 LAB — GLUCOSE, CAPILLARY
Glucose-Capillary: 93 mg/dL (ref 65–99)
Glucose-Capillary: 96 mg/dL (ref 65–99)
Glucose-Capillary: 109 mg/dL — ABNORMAL HIGH (ref 65–99)
Glucose-Capillary: 145 mg/dL — ABNORMAL HIGH (ref 65–99)
Glucose-Capillary: 102 mg/dL — ABNORMAL HIGH (ref 65–99)
Glucose-Capillary: 110 mg/dL — ABNORMAL HIGH (ref 65–99)
Glucose-Capillary: 136 mg/dL — ABNORMAL HIGH (ref 65–99)

## 2017-07-11 LAB — RENAL FUNCTION PANEL
Albumin: 2.6 g/dL — ABNORMAL LOW (ref 3.5–5.0)
Albumin: 2.3 g/dL — ABNORMAL LOW (ref 3.5–5.0)
Anion gap: 16 — ABNORMAL HIGH (ref 5–15)
Anion gap: 11 (ref 5–15)
BUN: 25 mg/dL — ABNORMAL HIGH (ref 6–20)
BUN: 18 mg/dL (ref 6–20)
CO2: 23 mmol/L (ref 22–32)
CO2: 24 mmol/L (ref 22–32)
Calcium: 9 mg/dL (ref 8.9–10.3)
Calcium: 8.8 mg/dL — ABNORMAL LOW (ref 8.9–10.3)
Chloride: 95 mmol/L — ABNORMAL LOW (ref 101–111)
Chloride: 97 mmol/L — ABNORMAL LOW (ref 101–111)
Creatinine, Ser: 4.42 mg/dL — ABNORMAL HIGH (ref 0.61–1.24)
Creatinine, Ser: 3.22 mg/dL — ABNORMAL HIGH (ref 0.61–1.24)
GFR calc Af Amer: 16 mL/min — ABNORMAL LOW (ref >60)
GFR calc Af Amer: 24 mL/min — ABNORMAL LOW (ref >60)
GFR calc non Af Amer: 14 mL/min — ABNORMAL LOW (ref >60)
GFR calc non Af Amer: 20 mL/min — ABNORMAL LOW (ref >60)
Glucose, Bld: 99 mg/dL (ref 65–99)
Glucose, Bld: 117 mg/dL — ABNORMAL HIGH (ref 65–99)
Phosphorus: 3.2 mg/dL (ref 2.5–4.6)
Phosphorus: 2.8 mg/dL (ref 2.5–4.6)
Potassium: 4.5 mmol/L (ref 3.5–5.1)
Potassium: 3.9 mmol/L (ref 3.5–5.1)
Sodium: 134 mmol/L — ABNORMAL LOW (ref 135–145)
Sodium: 132 mmol/L — ABNORMAL LOW (ref 135–145)

## 2017-07-11 LAB — CBC
HCT: 21.4 % — ABNORMAL LOW (ref 39.0–52.0)
Hemoglobin: 7.1 g/dL — ABNORMAL LOW (ref 13.0–17.0)
MCH: 26.6 pg (ref 26.0–34.0)
MCHC: 33.2 g/dL (ref 30.0–36.0)
MCV: 80.1 fL (ref 78.0–100.0)
Platelets: 158 10*3/uL (ref 150–400)
RBC: 2.67 MIL/uL — ABNORMAL LOW (ref 4.22–5.81)
RDW: 21.1 % — ABNORMAL HIGH (ref 11.5–15.5)
WBC: 7.3 10*3/uL (ref 4.0–10.5)

## 2017-07-11 LAB — POCT I-STAT 3, ART BLOOD GAS (G3+)
Acid-base deficit: 2 mmol/L (ref 0.0–2.0)
Bicarbonate: 24.3 mmol/L (ref 20.0–28.0)
O2 Saturation: 95 %
Patient temperature: 98.8
TCO2: 26 mmol/L (ref 22–32)
pCO2 arterial: 47.9 mmHg (ref 32.0–48.0)
pH, Arterial: 7.313 — ABNORMAL LOW (ref 7.350–7.450)
pO2, Arterial: 81 mmHg — ABNORMAL LOW (ref 83.0–108.0)

## 2017-07-11 LAB — APTT: aPTT: 42 seconds — ABNORMAL HIGH (ref 24–36)

## 2017-07-11 LAB — MAGNESIUM: Magnesium: 2.3 mg/dL (ref 1.7–2.4)

## 2017-07-11 MED ORDER — SODIUM CHLORIDE 0.9% FLUSH: 10.0000 mL | INTRAVENOUS | Status: DC | PRN

## 2017-07-11 MED ORDER — PANTOPRAZOLE SODIUM 40 MG PO TBEC
40.0000 mg | DELAYED_RELEASE_TABLET | Freq: Every day | ORAL | Status: DC
  Administered 2017-07-11 – 2017-07-15 (×5): 40 mg via ORAL
  Filled 2017-07-11 (×5): qty 1

## 2017-07-11 MED ORDER — CHLORHEXIDINE GLUCONATE CLOTH 2 % EX PADS
6.0000 | MEDICATED_PAD | Freq: Every day | CUTANEOUS | Status: DC
  Administered 2017-07-11 – 2017-07-17 (×7): 6 via TOPICAL

## 2017-07-11 MED ORDER — DILTIAZEM HCL ER COATED BEADS 120 MG PO CP24
120.0000 mg | ORAL_CAPSULE | Freq: Every day | ORAL | Status: DC
  Administered 2017-07-11 – 2017-07-18 (×7): 120 mg via ORAL
  Filled 2017-07-11 (×9): qty 1

## 2017-07-11 MED FILL — Medication: Qty: 1 | Status: AC

## 2017-07-11 NOTE — Progress Notes (Signed)
.. ..  Name: [name] MRN: [id number] DOB: 1961-03-12    ADMISSION DATE:  07/04/2017 CONSULTATION DATE:  07/08/2017  REFERRING MD :  Adele Barthel MD  CHIEF COMPLAINT:  Dyspnea and cough  BRIEF PATIENT DESCRIPTION:  56 yo male smoker presented with Lt arm wound.  He developed fever, respiratory distress and required intubation.  PMHx of Pulmonary HTN, mild COPD, ESRD, HTN, CAD s/p stent, mitral stenosis, GERD, Cocaine abuse, Hep B and C.  SUBJECTIVE:  Extubated 12/30.  He has had some agitation, confusion.  Noted to be tachypneic since extubation but adequate oxygenation.   VITAL SIGNS: BP (!) 106/50   Pulse (!) 117   Temp 98.8 F (37.1 C) (Oral)   Resp 19   Ht 5\' 9"  (1.753 m)   Wt 75.1 kg (165 lb 9.1 oz)   SpO2 97%   BMI 24.45 kg/m   INTAKE/OUTPUT: I/O last 3 completed shifts: In: 3922.5 [P.O.:560; I.V.:2492.5; NG/GT:620; IV Piggyback:250] Out: 4111 [Emesis/NG output:100; Other:4011]  PHYSICAL EXAMINATION:  General -ill-appearing, Eyes -pupils reactive, ENT -oropharynx clear, adequate cough Cardiac -irregular, tachycardic, no murmur Chest -slightly tachypneic, bilateral soft inspiratory crackles, no wheezes Abd -soft, no apparent tenderness, benign Ext -no significant lower extremity edema Skin -left arm dressed, left thigh graft site clean Neuro -awake, slightly agitated but will reorient with interaction, moves all extremities,   LABS: CBC Recent Labs    07/10/17 0321 07/10/17 0404 07/10/17 0423 07/11/17 0303  WBC 9.8 7.5  --  7.3  HGB 8.3* 7.1* 8.5* 7.1*  HCT 25.4* 21.8* 25.0* 21.4*  PLT 164 139*  --  158    Coag's Recent Labs    07/10/17 0706 07/11/17 0303  APTT 44* 42*    BMET Recent Labs    07/10/17 0404 07/10/17 0423 07/10/17 0705 07/11/17 0303  NA 135 135 134* 134*  K 4.3 4.9 4.8 4.5  CL 101  --  96* 95*  CO2 21*  --  23 23  BUN 38*  --  43* 25*  CREATININE 6.54*  --  7.43* 4.42*  GLUCOSE 117* 133* 128* 99     Electrolytes Recent Labs    07/10/17 0321 07/10/17 0404 07/10/17 0705 07/10/17 0706 07/11/17 0303  CALCIUM 9.3 8.1* 9.3  --  9.0  MG 2.3  --   --  2.4 2.3  PHOS 5.9*  5.9*  --  5.5*  --  3.2    Sepsis Markers Recent Labs    07/09/17 1042  PROCALCITON 5.63    ABG Recent Labs    07/10/17 0539 07/10/17 1932 07/11/17 0730  PHART 7.364 7.387 7.313*  PCO2ART 43.0 39.7 47.9  PO2ART 416* 87.0 81.0*    Liver Enzymes Recent Labs    07/10/17 0321 07/10/17 0705 07/11/17 0303  ALBUMIN 3.0* 2.8* 2.6*    Cardiac Enzymes Recent Labs    07/10/17 0404  TROPONINI 0.32*    Glucose Recent Labs    07/10/17 1155 07/10/17 1623 07/10/17 1959 07/10/17 2359 07/11/17 0309 07/11/17 0735  GLUCAP 127* 124* 115* 93 96 109*    Imaging Dg Abd 1 View  Result Date: 07/10/2017 CLINICAL DATA:  Abdominal distention. EXAM: ABDOMEN - 1 VIEW COMPARISON:  04/23/2017 FINDINGS: There is gaseous distension of the colon. Moderate stool burden identified within the rectum. No abnormal small bowel dilatation. IMPRESSION: 1. Gaseous distension of the colon with moderate stool burden in the rectum. Correlate for any clinical signs or symptoms of rectal impaction or constipation. Electronically Signed  By: Kerby Moors M.D.   On: 07/10/2017 10:32   Dg Chest Port 1 View  Result Date: 07/10/2017 CLINICAL DATA:  56 year old male with a history of intubation EXAM: PORTABLE CHEST 1 VIEW COMPARISON:  Chest x-ray 07/10/2017, 07/08/2017, CT 07/08/2017 FINDINGS: Cardiomediastinal silhouette unchanged with cardiomegaly. No enlarged central vasculature. No interlobular septal thickening. No pneumothorax.  No confluent airspace disease. Unchanged position of endotracheal tube, terminating approximately 6.3 cm above the carina. Unchanged gastric tube terminating out of the field of view. Side port terminates in the distal esophagus. Unchanged position of right IJ hemodialysis catheter, terminating at  the superior cavoatrial junction. Defibrillator pads project over the chest. IMPRESSION: Unchanged appearance of the chest x-ray, with no evidence of edema or confluent airspace disease. Cardiomegaly. Unchanged endotracheal tube, right IJ hemodialysis catheter, defibrillator pads. Gastric tube projects over the mediastinum terminating out of the field of view, with the side-port in the distal esophagus. Electronically Signed   By: Corrie Mckusick D.O.   On: 07/10/2017 10:35   Dg Chest Port 1 View  Result Date: 07/10/2017 CLINICAL DATA:  Respiratory failure EXAM: PORTABLE CHEST 1 VIEW COMPARISON:  07/08/2017 FINDINGS: Endotracheal tube stable. NG tube placed. The tip is at the gastroesophageal junction. The heart remains enlarged with a globular appearance. Right jugular dialysis catheter remains in place with its tip at the cavoatrial junction. Vascular congestion has improved. Clear lungs. No pneumothorax. IMPRESSION: Improved vascular congestion. NG tube placed with its tip at the gastroesophageal junction. This should be advanced. Electronically Signed   By: Marybelle Killings M.D.   On: 07/10/2017 09:36    STUDIES: Echo 06/01/17 >> mod LVH, severe MS, PAS 95 mmHg CT angio chest/abd/pelvis 07/08/17 >> atherosclerosis, b/l nodular clusters concerning for PNA, CM  CULTURES: Blood 12/28 >>  Respiratory viral panel 12/29 >> RSV  ANTIBIOTICS: Zosyn 12/28 >> Vancomycin 12/28 >> 12/29  LINES/TUBES: RT [place] HD catheter >>  ETT 12/28 >> 12/30  EVENTS: 12/24 Admit, split thickness skin graft Lt arm  12/28 VDRF 12/30 WCT, cardiology consulted  DISCUSSION: 56 yo male smoker, history ESRD, with acute hypoxic respiratory failure from PNA with respiratory viral panel positive for RSV.  ASSESSMENT / PLAN:  Acute hypoxic respiratory failure. Viral pneumonitis with suspected bacterial superinfection. Tobacco abuse. Hx of mild COPD. Marginal respiratory status with tachypnea, some hypoxemia.  Attempt to  avoid reintubation.  Agitation may be a contributor here. Continue antibiotics for now, zosyn day 4 Continue scheduled bronchodilators as ordered -Brovana, DuoNeb as needed Pulmicort scheduled Repeat CXR 1/1  A fib with RVR.  Current heart rate 114 Wide complex tachycardia. Hx of CAD, HTN, severe MS, pulmonary HTN. On amiodarone drip, enteral metoprolol Home diltiazem, antihypertensives currently on hold; question whether he may benefit changing his beta-blocker to home diltiazem given his wheezing, COPD Continue Plavix Appreciate cardiology assistance, question when we can convert back to his home medicine regimen  ESRD on HD. Currently on CVVHD, remains 2.7 L positive.  He will benefit from volume removal as his blood pressure control her rate. Defer to nephrology as to when he can transition from continuous dialysis to intermittent dialysis.  Anemia of chronic disease. Continue to follow CBC  Acute metabolic encephalopathy. Mild agitated delirium, at some risk for substance withdrawal RASS goal 0   DVT prophylaxis: SQ heparin SUP: Protonix Nutrition: Tube feeds Goals of care: Full code  Updated patient's sister at bedside  Independent critical care time 55 minutes  Baltazar Apo, MD, PhD 07/11/2017, 8:38 AM  [place] [facility] [phone] or if no answer [phone]

## 2017-07-11 NOTE — Progress Notes (Signed)
Patient ID: [name], male   DOB: [birth year], 56 y.o.   MRN: [id number]  [facility] Progress Note    Subjective:   Extubated yesterday, somewhat agitated.  Off pressors but remains on CVVHD, HR remains elevated and in A fib but BP's have improved somewhat.   Objective:   BP (!) 106/50   Pulse (!) 117   Temp 98.6 F (37 C) (Oral)   Resp 19   Ht 5\' 9"  (1.753 m)   Wt 75.1 kg (165 lb 9.1 oz)   SpO2 97%   BMI 24.45 kg/m   Intake/Output: I/O last 3 completed shifts: In: 3922.5 [P.O.:560; I.V.:2492.5; NG/GT:620; IV Piggyback:250] Out: 4111 [Emesis/NG output:100; Other:4011]   Intake/Output this shift:  Total I/O In: 50 [I.V.:50] Out: 133 [Other:133] Weight change: 2.1 kg (4 lb 10.1 oz)  Physical Exam: Gen:NAD CVS: IRR IRR Resp: scattered rhonchi bilaterally Abd: +BS, soft, NT Ext: 1+ pitting lower extremities, LUE arm wrapped unable to auscultate or palpate thrill/bruit  Labs: BMET Recent Labs  Lab 07/06/17 0738 07/08/17 1352 07/08/17 1959 07/09/17 1042 07/09/17 1628 07/10/17 0321 07/10/17 0404 07/10/17 0423 07/10/17 0705 07/11/17 0303  NA 134* 132* 134*  --   --  133* 135 135 134* 134*  K 5.9* 4.3 4.7  --   --  5.6* 4.3 4.9 4.8 4.5  CL 92* 94* 95*  --   --  95* 101  --  96* 95*  CO2 29 28 26   --   --  21* 21*  --  23 23  GLUCOSE 97 104* 97  --   --  118* 117* 133* 128* 99  BUN 37* 28* 23*  --   --  43* 38*  --  43* 25*  CREATININE 9.26* 6.67* 5.77*  --   --  7.47* 6.54*  --  7.43* 4.42*  ALBUMIN 3.1* 2.9*  --   --   --  3.0*  --   --  2.8* 2.6*  CALCIUM 9.4 9.0 9.6  --   --  9.3 8.1*  --  9.3 9.0  PHOS 5.2* 3.6  --  5.9* 5.4* 5.9*  5.9*  --   --  5.5* 3.2   CBC Recent Labs  Lab 07/08/17 1959 07/10/17 0321 07/10/17 0404 07/10/17 0423 07/11/17 0303  WBC 10.8* 9.8 7.5  --  7.3  HGB 7.6* 8.3* 7.1* 8.5* 7.1*  HCT 23.2* 25.4* 21.8* 25.0* 21.4*  MCV 83.8 82.5 82.3  --  80.1  PLT 121* 164 139*  --  158     @IMGRELPRIORS @ Medications:    . arformoterol  15 mcg Nebulization BID  . bacitracin   Topical Daily  . budesonide (PULMICORT) nebulizer solution  0.5 mg Nebulization BID  . Chlorhexidine Gluconate Cloth  6 each Topical Daily  . clopidogrel  75 mg Oral Daily  . [START ON 07/13/2017] darbepoetin (ARANESP) injection - NON-DIALYSIS  150 mcg Subcutaneous Once  . diltiazem  120 mg Oral Daily  . docusate  100 mg Oral BID  . heparin  5,000 Units Subcutaneous Q8H  . mouth rinse  15 mL Mouth Rinse BID  . multivitamin  1 tablet Oral QHS  . nicotine  14 mg Transdermal Daily  . pantoprazole sodium  40 mg Per Tube Q24H   Dialysis Orders: MWF at [facility] 4h  73kg   Hep 3000   400/800  2/2 bath  - Mircera 236mcg IV q 2 weeks (last 12/19) - Cinacalcet 120mg  TIW  Assessment/  Plan:   1. LUE skin necrosis after AVF infiltration- s/p skin graft 07/04/17 2. Acute on Chronic respiratory failure- +RSV, viral pneumonia s/p extubation 07/10/17 3. A fib with RVR- possible transition to dilt per Cardiology 4. ESRD on CVVHD since 07/09/17.  Will increase UF goal and follow 5. Anemia: transfuse if continues to drop 6. CKD-MBD: stable 7. Nutrition: renal when able to take po 8. Hep B- continue with contact precautions 9. CAD- per Cardiology  Donetta Potts, MD [place] Pager [phone] 07/11/2017, 9:24 AM

## 2017-07-11 NOTE — Progress Notes (Signed)
Subjective:  No chest pain Has cough and wheezing  Extubated 07/10/2017  Objective:  Vital Signs in the last 24 hours: Temp:  [97.6 F (36.4 C)-99.6 F (37.6 C)] 98.6 F (37 C) (12/31 0800) Pulse Rate:  [64-125] 117 (12/31 0800) Resp:  [14-27] 19 (12/31 0800) BP: (106)/(50) 106/50 (12/30 1115) SpO2:  [96 %-100 %] 97 % (12/31 0823) Arterial Line BP: (91-169)/(42-73) 119/54 (12/31 0800) FiO2 (%):  [40 %] 40 % (12/30 1217) Weight:  [75.1 kg (165 lb 9.1 oz)] 75.1 kg (165 lb 9.1 oz) (12/31 0600)  Intake/Output from previous day: 12/30 0701 - 12/31 0700 In: 3354.6 [P.O.:560; I.V.:2204.6; NG/GT:390; IV Piggyback:200] Out: 4043 [Emesis/NG output:100] Intake/Output from this shift: Total I/O In: 50 [I.V.:50] Out: 133 [Other:133]  Physical Exam: Nursing note and vitals reviewed. Constitutional: He appears well-developed.  HENT:  Head: Normocephalic and atraumatic.  Eyes: Pupils are equal, round, and reactive to light.  Neck: Normal range of motion. No JVD present.  Cardiovascular: Normal rate.  Murmur (III/IV apical holosystolic murmur) heard. Irregularly irregular rhythm Diminished distal pulses  Respiratory: He has wheezes and b/l rhonchi. He has no rales.  GI: Bowel sounds are normal. He exhibits no distension. There is no tenderness.  Musculoskeletal: He exhibits no edema .  Normal range of motion Lymphadenopathy:    He has no cervical adenopathy.  Neurological:  Bilaterally equal pupils. No gross neurodeficits Skin: Skin is warm and dry. No erythema.  Left arm dressing in place Psychiatric: Stable     Lab Results: Recent Labs    07/10/17 0404 07/10/17 0423 07/11/17 0303  WBC 7.5  --  7.3  HGB 7.1* 8.5* 7.1*  PLT 139*  --  158   Recent Labs    07/10/17 0705 07/11/17 0303  NA 134* 134*  K 4.8 4.5  CL 96* 95*  CO2 23 23  GLUCOSE 128* 99  BUN 43* 25*  CREATININE 7.43* 4.42*   Recent Labs    07/10/17 0404  TROPONINI 0.32*   Hepatic Function  Panel Recent Labs    07/11/17 0303  ALBUMIN 2.6*   No results for input(s): CHOL in the last 72 hours. No results for input(s): PROTIME in the last 72 hours.  Imaging: CTA chest/abdomen 07/08/2017: IMPRESSION: 1. Very limited study due to streak artifact caused by patient's arms and anasarca. No aortic aneurysm or dissection. 2. Moderate Aortic Atherosclerosis (ICD10-I70.0). 3. Bibasilar clusters of nodularity concerning for pneumonia. Clinical correlation is recommended. 4. Moderate cardiomegaly and multi vessel coronary vascular calcification and findings of right heart dysfunction correlation with clinical exam and echocardiogram recommended. Pericardial effusion measuring 14 mm in thickness. 5. Osteosclerosis in keeping with renal osteodystrophy. 6. Small ascites and anasarca.    Cardiac Studies: Echocardiogram 06/01/2017: Study Conclusions  - Left ventricle: The cavity size was normal. Wall thickness was increased in a pattern of moderate LVH. The study is not technically sufficient to allow evaluation of LV diastolic function. - Mitral valve: Calcified annulus and basal leaflets severe MS based on gradients. Moderately calcified annulus. Moderately thickened leaflets . There was mild regurgitation. - Left atrium: The atrium was moderately dilated. - Right ventricle: The cavity size was moderately dilated. - Right atrium: The atrium was moderately dilated. - Atrial septum: No defect or patent foramen ovale was identified. - Tricuspid valve: There was severe regurgitation. - Pulmonary arteries: PA peak pressure: 95 mm Hg (S). - Pericardium, extracardiac: Small posterior pericardial effusion    Assessment: Acute hypoxic respiratory failure, extubated 07/10/2017 Bilateral  viral pneumonia, RSV positive A. fib with RVR Mild troponin elevation: Chronic myocardial injury, not MI CAD status post mid RCA PCI August 2018 Moderate calcific mitral  stenosis Grp II Pulmonary hypertension Recent left arm AV fistula ligation, skin necrosis End-stage renal disease on hemodialysis Anemia of chronic disease COPD Tobacco and polysubstance abuse   Recommendations: Agree with switching metoprolol to diltiazem CD 120 mg given wheezing. May uptitrate as tolerated and wean off amiodarone. His Afib RVR is primarily driven by infectious process, and I am hopeful that he may not need long term PO amiodarone.  High CHA2DS2VASc score, but not on warfarin at baseline due to noncompliance.  Continue plavix. Management of acute respiratory failure and possible sepsis as per critical care team.     LOS: 7 days    Grabiel Schmutz J Jarett Dralle 07/11/2017, 9:05 AM

## 2017-07-11 NOTE — Progress Notes (Signed)
Pt A-line BP has steadily decreased throughout shift; fluid removal rates turned down on CRRT, pulling less than goal of 50-75 cc/hr. Levophed gtt restarted, currently @ 3 mcg/min. Will continue to closely monitor.  Sherlie Ban, RN

## 2017-07-11 NOTE — Care Management Note (Signed)
Case Management Note Original Note Created by: Marvetta Gibbons RN, BSN Unit 4E-Case Manager-- weekend coverage for [facility] [phone]  Patient Details  Name: [name] MRN: [id number] Date of Birth: [birth year]  Subjective/Objective:   Pt admitted with bleeding from left arm surgical site and also afib, COPD                 Action/Plan: PTA pt lived at home- just discharged from [facility] yesterday with [facility] services arranged with [facility]- (verified with [name] at [facility])- pt also had home wound VAC supplied by [facility]-(home wound VAC is in a bag in pt's room)- if pt is to return home with wound VAC- will need HH resumed and home wound VAC sent home for [facility] to reapply. - CM will follow for transition needs to return back home.   Expected Discharge Date:                  Expected Discharge Plan:  [place]  In-House Referral:     Discharge planning Services  CM Consult  Post Acute Care Choice:    Choice offered to:  Patient  DME Arranged:    DME Agency:     HH Arranged:  RN [place] Agency:  [facility]  Status of Service:  In process, will continue to follow  If discussed at Long Length of Stay Meetings, dates discussed:    Discharge Disposition:   Additional Comments: 07/11/2017  [facility] confirmed they can offer daily changes for a week at the max - pts sister has offered to be trained by agency to continue daily dressing changes.  Pt is now in [place] on CRRT.  CM will continue to follow for discharge needs  06/23/17 Pt to discharge home today - friend will drive pt home.  Pt is interested in [facility] PT and OT - no longer needs RN as wound vac is no longer in place.  Pt has PCP and denied barriers to obtaining medications as prescribed.  CM offered choice for [facility] - pt would like to remain with [facility] - agency contacted and new referral accepted - CM requested [place] orders from attending.  CM offered choice for DME - pt chose [facility]- agency contacted and  referral accepted - CM requested [place] orders from attending  06/21/17 Pt alert and oriented.  Per Attending pt will likely not discharge home with wound vac per surgery.  Pt does not currently have on wound vac however his wound vac from home is in his room.  CM informed [facility] that pt will not likely require HH for wound vac management.  Pt independently ambulating around unit.  Pt states he will have family to pick him up at discharge - pt denied all CM needs.  [facility] also made aware of pt likely discharging without wound vac Maryclare Labrador, RN 07/11/2017, 1:56 PM

## 2017-07-11 NOTE — Progress Notes (Addendum)
  Progress Note    07/11/2017 8:20 AM 7 Days Post-Op  Subjective:  Extubated; no pain from L arm skin graft site.   Vitals:   07/11/17 0715 07/11/17 0800  BP:    Pulse:  (!) 117  Resp:  19  Temp: 98.8 F (37.1 C)   SpO2:  100%   Physical Exam: Cardiac:  tachy Incisions:  L thigh dressing left in place Extremities:  L arm skin graft healing well at this point Abdomen:  Soft Neurologic: A&O  CBC    Component Value Date/Time   WBC 7.3 07/11/2017 0303   RBC 2.67 (L) 07/11/2017 0303   HGB 7.1 (L) 07/11/2017 0303   HCT 21.4 (L) 07/11/2017 0303   PLT 158 07/11/2017 0303   MCV 80.1 07/11/2017 0303   MCH 26.6 07/11/2017 0303   MCHC 33.2 07/11/2017 0303   RDW 21.1 (H) 07/11/2017 0303   LYMPHSABS 1.5 06/19/2017 2030   MONOABS 0.8 06/19/2017 2030   EOSABS 0.0 06/19/2017 2030   BASOSABS 0.0 06/19/2017 2030    BMET    Component Value Date/Time   NA 134 (L) 07/11/2017 0303   K 4.5 07/11/2017 0303   CL 95 (L) 07/11/2017 0303   CO2 23 07/11/2017 0303   GLUCOSE 99 07/11/2017 0303   BUN 25 (H) 07/11/2017 0303   CREATININE 4.42 (H) 07/11/2017 0303   CREATININE 9.18 (H) 02/15/2017 1425   CALCIUM 9.0 07/11/2017 0303   CALCIUM 8.1 (L) 07/11/2011 1117   GFRNONAA 14 (L) 07/11/2017 0303   GFRNONAA 6 (L) 02/15/2017 1425   GFRAA 16 (L) 07/11/2017 0303   GFRAA 7 (L) 02/15/2017 1425    INR    Component Value Date/Time   INR 1.20 06/14/2017 0936     Intake/Output Summary (Last 24 hours) at 07/11/2017 0820 Last data filed at 07/11/2017 0800 Gross per 24 hour  Intake 2614.63 ml  Output 4041 ml  Net -1426.37 ml     Assessment/Plan:  56 y.o. male is s/p L STSG 7 Days Post-Op   Continue empiric IV abx for suspected PNA Levophed restarted; Cardiology to see Heme: down to 7.1; consider transfusion PRBCs HD on schedule per Nephrology L arm skin graft currently taking well; continue PA/MD daily dressing changes with bacitracin, xeroform, gauze, and gentle  ace    Dagoberto Ligas, PA-C [facility] [phone] 07/11/2017 8:20 AM  I have examined the patient, reviewed and agree with above.  Increased work of breathing since extubation yesterday.  Plan per critical care  Curt Jews, MD 07/11/2017 10:40 AM

## 2017-07-12 ENCOUNTER — Inpatient Hospital Stay (HOSPITAL_COMMUNITY): Payer: Medicare Other

## 2017-07-12 DIAGNOSIS — J441 Chronic obstructive pulmonary disease with (acute) exacerbation: Secondary | ICD-10-CM

## 2017-07-12 DIAGNOSIS — K626 Ulcer of anus and rectum: Secondary | ICD-10-CM

## 2017-07-12 HISTORY — DX: Ulcer of anus and rectum: K62.6

## 2017-07-12 LAB — RENAL FUNCTION PANEL
Albumin: 2.3 g/dL — ABNORMAL LOW (ref 3.5–5.0)
Anion gap: 11 (ref 5–15)
BUN: 14 mg/dL (ref 6–20)
CO2: 26 mmol/L (ref 22–32)
Calcium: 8.9 mg/dL (ref 8.9–10.3)
Chloride: 96 mmol/L — ABNORMAL LOW (ref 101–111)
Creatinine, Ser: 2.67 mg/dL — ABNORMAL HIGH (ref 0.61–1.24)
GFR calc Af Amer: 30 mL/min — ABNORMAL LOW (ref >60)
GFR calc non Af Amer: 25 mL/min — ABNORMAL LOW (ref >60)
Glucose, Bld: 104 mg/dL — ABNORMAL HIGH (ref 65–99)
Phosphorus: 2.2 mg/dL — ABNORMAL LOW (ref 2.5–4.6)
Potassium: 3.5 mmol/L (ref 3.5–5.1)
Sodium: 133 mmol/L — ABNORMAL LOW (ref 135–145)

## 2017-07-12 LAB — CBC
HCT: 20.4 % — ABNORMAL LOW (ref 39.0–52.0)
Hemoglobin: 6.9 g/dL — CL (ref 13.0–17.0)
MCH: 26.6 pg (ref 26.0–34.0)
MCHC: 33.8 g/dL (ref 30.0–36.0)
MCV: 78.8 fL (ref 78.0–100.0)
Platelets: 149 10*3/uL — ABNORMAL LOW (ref 150–400)
RBC: 2.59 MIL/uL — ABNORMAL LOW (ref 4.22–5.81)
RDW: 21 % — ABNORMAL HIGH (ref 11.5–15.5)
WBC: 5.7 10*3/uL (ref 4.0–10.5)

## 2017-07-12 LAB — GLUCOSE, CAPILLARY
Glucose-Capillary: 101 mg/dL — ABNORMAL HIGH (ref 65–99)
Glucose-Capillary: 114 mg/dL — ABNORMAL HIGH (ref 65–99)
Glucose-Capillary: 116 mg/dL — ABNORMAL HIGH (ref 65–99)
Glucose-Capillary: 118 mg/dL — ABNORMAL HIGH (ref 65–99)
Glucose-Capillary: 125 mg/dL — ABNORMAL HIGH (ref 65–99)

## 2017-07-12 LAB — BLOOD GAS, ARTERIAL
Acid-Base Excess: 2.7 mmol/L — ABNORMAL HIGH (ref 0.0–2.0)
Bicarbonate: 27 mmol/L (ref 20.0–28.0)
O2 Saturation: 92.6 %
Patient temperature: 98.6
pCO2 arterial: 43.6 mmHg (ref 32.0–48.0)
pH, Arterial: 7.408 (ref 7.350–7.450)
pO2, Arterial: 64.2 mmHg — ABNORMAL LOW (ref 83.0–108.0)

## 2017-07-12 LAB — APTT: aPTT: 44 seconds — ABNORMAL HIGH (ref 24–36)

## 2017-07-12 LAB — HEMOGLOBIN AND HEMATOCRIT, BLOOD
HCT: 23.9 % — ABNORMAL LOW (ref 39.0–52.0)
Hemoglobin: 8 g/dL — ABNORMAL LOW (ref 13.0–17.0)

## 2017-07-12 LAB — PREPARE RBC (CROSSMATCH)

## 2017-07-12 LAB — MAGNESIUM: Magnesium: 2.4 mg/dL (ref 1.7–2.4)

## 2017-07-12 MED ORDER — WHITE PETROLATUM EX OINT
TOPICAL_OINTMENT | CUTANEOUS | Status: AC
  Administered 2017-07-12: 02:00:00
  Filled 2017-07-12: qty 28.35

## 2017-07-12 MED ORDER — ZOLPIDEM TARTRATE 5 MG PO TABS
10.0000 mg | ORAL_TABLET | Freq: Every evening | ORAL | Status: DC | PRN
  Administered 2017-07-12 – 2017-07-17 (×3): 10 mg via ORAL
  Filled 2017-07-12 (×3): qty 2

## 2017-07-12 MED ORDER — SODIUM CHLORIDE 0.9 % IV SOLN
Freq: Once | INTRAVENOUS | Status: AC
  Administered 2017-07-12: 07:00:00 via INTRAVENOUS

## 2017-07-12 MED ORDER — SIMETHICONE 80 MG PO CHEW
80.0000 mg | CHEWABLE_TABLET | Freq: Four times a day (QID) | ORAL | Status: DC | PRN
  Administered 2017-07-12 (×2): 80 mg via ORAL
  Filled 2017-07-12 (×3): qty 1

## 2017-07-12 MED ORDER — PREDNISONE 20 MG PO TABS
20.0000 mg | ORAL_TABLET | Freq: Every day | ORAL | Status: DC
  Administered 2017-07-13 – 2017-07-14 (×2): 20 mg via ORAL
  Filled 2017-07-12 (×2): qty 1

## 2017-07-12 NOTE — Progress Notes (Signed)
eLink Physician-Brief Progress Note Patient Name: [name] DOB: 1960/09/26 MRN: [id number]   Date of Service  07/12/2017  HPI/Events of Note  Anemia - Hgb = 6.9.  eICU Interventions  Will transfuse 1 unit PRBC now.      Intervention Category Major Interventions: Other:  Lysle Dingwall 07/12/2017, 6:34 AM

## 2017-07-12 NOTE — Progress Notes (Signed)
Subjective: Interval History: none.. More stable today.  Much less work of breathing.  Objective: Vital signs in last 24 hours: Temp:  [96.4 F (35.8 C)-98 F (36.7 C)] 97.1 F (36.2 C) (01/01 0839) Pulse Rate:  [61-110] 63 (01/01 0900) Resp:  [11-27] 12 (01/01 0900) BP: (125-143)/(48-57) 143/57 (01/01 0839) SpO2:  [92 %-100 %] 100 % (01/01 0900) Arterial Line BP: (101-162)/(40-88) 154/60 (01/01 0900) Weight:  [163 lb 12.8 oz (74.3 kg)] 163 lb 12.8 oz (74.3 kg) (01/01 0400)  Intake/Output from previous day: 12/31 0701 - 01/01 0700 In: 1023.3 [P.O.:480; I.V.:313.3; Blood:30; IV Piggyback:200] Out: 3412  Intake/Output this shift: Total I/O In: 605 [I.V.:20; Blood:585] Out: 164 [Other:164]  Did not take down dressing today  Lab Results: Recent Labs    07/11/17 0303 07/12/17 0400  WBC 7.3 5.7  HGB 7.1* 6.9*  HCT 21.4* 20.4*  PLT 158 149*   BMET Recent Labs    07/11/17 1611 07/12/17 0400  NA 132* 133*  K 3.9 3.5  CL 97* 96*  CO2 24 26  GLUCOSE 117* 104*  BUN 18 14  CREATININE 3.22* 2.67*  CALCIUM 8.8* 8.9    Studies/Results: Dg Abd 1 View  Result Date: 07/10/2017 CLINICAL DATA:  Abdominal distention. EXAM: ABDOMEN - 1 VIEW COMPARISON:  04/23/2017 FINDINGS: There is gaseous distension of the colon. Moderate stool burden identified within the rectum. No abnormal small bowel dilatation. IMPRESSION: 1. Gaseous distension of the colon with moderate stool burden in the rectum. Correlate for any clinical signs or symptoms of rectal impaction or constipation. Electronically Signed   By: Kerby Moors M.D.   On: 07/10/2017 10:32   Ct Humerus Left W Contrast  Result Date: 06/13/2017 CLINICAL DATA:  Dialysis patient with a history of an arteriovenous fistula and an open wound on the left upper arm. Question abscess. EXAM: CT OF THE UPPER LEFT EXTREMITY WITH CONTRAST TECHNIQUE: Multidetector CT imaging of the upper left extremity was performed according to the standard  protocol following intravenous contrast administration. COMPARISON:  None. CONTRAST:  100 ml ISOVUE-300 IOPAMIDOL (ISOVUE-300) INJECTION 61% FINDINGS: Bones/Joint/Cartilage No acute or focal abnormality is identified. No periosteal reaction is seen. Ligaments Suboptimally assessed by CT. Muscles and Tendons No intramuscular fluid collection is seen. Musculature appears somewhat edematous, particularly the biceps, suggestive of infectious or inflammatory change. Soft tissues There is stranding in subcutaneous fat diffusely consistent with cellulitis. No soft tissue gas is identified. Skin defect just superior to the elbow correlates with the patient's history of nonhealing wound. There is partial visualization of a small left pleural effusion. Atherosclerotic vascular disease is noted. Prominent left axillary lymph nodes have clearly visible fatty hila no most consistent with reactive change. IMPRESSION: Soft tissue wound anterior aspect of the left upper arm without underlying abscess or evidence of osteomyelitis. Diffuse subcutaneous edema about the upper arm could be due to volume overload or cellulitis. Musculature of the upper arm appears somewhat edematous which could be due to infectious or inflammatory change or volume overload. Small right pleural effusion. Atherosclerosis. Electronically Signed   By: Inge Rise M.D.   On: 06/13/2017 11:46   Dg Chest Port 1 View  Result Date: 07/12/2017 CLINICAL DATA:  Acute respiratory failure EXAM: PORTABLE CHEST 1 VIEW COMPARISON:  07/10/2017 chest radiograph. FINDINGS: Right internal jugular central venous catheter terminates over the right atrium. Stable cardiomediastinal silhouette with mild cardiomegaly and aortic atherosclerosis. No pneumothorax. No pleural effusion. No overt pulmonary edema. Stable mild patchy left retrocardiac lung opacity. No  new consolidative airspace disease. IMPRESSION: Stable chest radiograph. Mild cardiomegaly without overt pulmonary  edema. Stable left retrocardiac patchy opacity, favor atelectasis. Electronically Signed   By: Ilona Sorrel M.D.   On: 07/12/2017 07:56   Dg Chest Port 1 View  Result Date: 07/10/2017 CLINICAL DATA:  57 year old male with a history of intubation EXAM: PORTABLE CHEST 1 VIEW COMPARISON:  Chest x-ray 07/10/2017, 07/08/2017, CT 07/08/2017 FINDINGS: Cardiomediastinal silhouette unchanged with cardiomegaly. No enlarged central vasculature. No interlobular septal thickening. No pneumothorax.  No confluent airspace disease. Unchanged position of endotracheal tube, terminating approximately 6.3 cm above the carina. Unchanged gastric tube terminating out of the field of view. Side port terminates in the distal esophagus. Unchanged position of right IJ hemodialysis catheter, terminating at the superior cavoatrial junction. Defibrillator pads project over the chest. IMPRESSION: Unchanged appearance of the chest x-ray, with no evidence of edema or confluent airspace disease. Cardiomegaly. Unchanged endotracheal tube, right IJ hemodialysis catheter, defibrillator pads. Gastric tube projects over the mediastinum terminating out of the field of view, with the side-port in the distal esophagus. Electronically Signed   By: Corrie Mckusick D.O.   On: 07/10/2017 10:35   Dg Chest Port 1 View  Result Date: 07/10/2017 CLINICAL DATA:  Respiratory failure EXAM: PORTABLE CHEST 1 VIEW COMPARISON:  07/08/2017 FINDINGS: Endotracheal tube stable. NG tube placed. The tip is at the gastroesophageal junction. The heart remains enlarged with a globular appearance. Right jugular dialysis catheter remains in place with its tip at the cavoatrial junction. Vascular congestion has improved. Clear lungs. No pneumothorax. IMPRESSION: Improved vascular congestion. NG tube placed with its tip at the gastroesophageal junction. This should be advanced. Electronically Signed   By: Marybelle Killings M.D.   On: 07/10/2017 09:36   Dg Chest Port 1  View  Result Date: 07/08/2017 CLINICAL DATA:  Verify endotracheal tube EXAM: PORTABLE CHEST 1 VIEW COMPARISON:  07/08/2017 FINDINGS: Endotracheal tube tip is about 5.4 cm superior to the carina. Right-sided central venous catheter tip overlies the right atrium. Cardiomegaly with mild central congestion. Aortic atherosclerosis. No pneumothorax. IMPRESSION: 1. Endotracheal tube tip about 5.4 cm superior to the carina 2. Cardiomegaly with vascular congestion Electronically Signed   By: Donavan Foil M.D.   On: 07/08/2017 22:56   Dg Chest Port 1 View  Result Date: 07/08/2017 CLINICAL DATA:  Dyspnea.  Cocaine abuse EXAM: PORTABLE CHEST 1 VIEW COMPARISON:  June 18, 2017 FINDINGS: The heart size and mediastinal contours are stable. The heart size is enlarged. Dual lumen central venous line is identified unchanged. Mild increased pulmonary interstitium is identified bilaterally. There is no focal pneumonia or pleural effusion. The visualized skeletal structures are unremarkable. IMPRESSION: Cardiomegaly.  Mild interstitial edema. Electronically Signed   By: Abelardo Diesel M.D.   On: 07/08/2017 19:36   Dg Chest Port 1 View  Result Date: 06/18/2017 CLINICAL DATA:  57 year old male with shortness of breath. EXAM: PORTABLE CHEST 1 VIEW COMPARISON:  05/30/2017 FINDINGS: Right-sided IJ central venous catheter appears in stable condition. The cardiomediastinal silhouette is enlarged but unchanged. There is pulmonary vascular congestion. Stable elevation of the left hemidiaphragm with associated atelectasis. Theo Reither developing infiltrate in this region is difficult to entirely exclude. The right lung is clear. No sizable pleural effusions or pneumothorax. No acute osseous abnormalities. IMPRESSION: Stable cardiomegaly with left basilar atelectasis versus developing infiltrate. Electronically Signed   By: Kristopher Oppenheim M.D.   On: 06/18/2017 09:09   Ct Angio Chest/abd/pel For Dissection W And/or W/wo  Result Date:  07/08/2017 CLINICAL DATA:  57 year old male with acute respiratory illness. Shortness of breath and tachycardia. Dialysis patient. EXAM: CT ANGIOGRAPHY CHEST, ABDOMEN AND PELVIS TECHNIQUE: Multidetector CT imaging through the chest, abdomen and pelvis was performed using the standard protocol during bolus administration of intravenous contrast. Multiplanar reconstructed images and MIPs were obtained and reviewed to evaluate the vascular anatomy. CONTRAST:  ISOVUE-370 IOPAMIDOL (ISOVUE-370) INJECTION 76% COMPARISON:  Chest radiograph dated 07/08/2017 FINDINGS: Evaluation is limited due to streak artifact caused by patient's arms as well as anasarca. CTA CHEST FINDINGS Cardiovascular: There is moderate cardiomegaly. There is dilatation of the right atrium with retrograde flow of contrast into the IVC consistent with a degree of right heart dysfunction. Correlation with clinical exam and echocardiogram recommended. There is multi vessel coronary vascular calcification. Pericardial effusion measures 14 mm in thickness. There is advanced atherosclerotic calcification of the thoracic aorta. No aneurysmal dilatation or evidence of dissection. There is atherosclerotic calcification of the origins of the great vessels of the aortic arch. Evaluation of the pulmonary arteries is very limited due to suboptimal opacification and streak artifact. No large embolus identified in the main pulmonary artery. Mediastinum/Nodes: Mildly enlarged bilateral hilar lymph nodes. No large mediastinal fluid collection. Lungs/Pleura: There are clusters of nodularity involving the lung bases, left greater right most likely representing an infectious process and developing infiltrate. No lobar consolidation, pleural effusion, or pneumothorax. The central airways are patent. Musculoskeletal: Mild diffuse osteosclerosis most consistent with renal osteodystrophy. Review of the MIP images confirms the above findings. CTA ABDOMEN AND PELVIS FINDINGS  VASCULAR Aorta: Advanced atherosclerotic disease. No aneurysmal dilatation or evidence of dissection. Celiac: There is atherosclerotic calcification of the celiac artery. The visualized portion of the celiac artery appears patent. SMA: Advanced atherosclerotic calcification of the SMA. The SMA remains patent. Renals: Advanced atherosclerotic calcification of the renal artery is. There is diminished flow in the renal arteries. IMA: Poorly visualized. Inflow: Advanced atherosclerotic calcification. The iliac arteries appear patent. Veins: Poorly visualized.  No portal venous gas. Review of the MIP images confirms the above findings. NON-VASCULAR No intra-abdominal free air.  Small ascites. Hepatobiliary: 30 limited evaluation of the liver. The liver appears enlarged measuring 18 cm in midclavicular length. The gallbladder is not well visualized. Pancreas: Not well visualized. Spleen: Poorly visualized grossly unremarkable. Adrenals/Urinary Tract: The adrenal glands are not well visualized. Bilateral renal atrophy and renovascular calcification. The urinary bladder is predominantly collapsed. Stomach/Bowel: There is redundancy of the sigmoid colon. There is moderate stool in the distal colon as well as in the rectal vault which may represent a degree of fecal impaction. Clinical correlation is recommended. Evaluation of the bowel is very limited in the absence of oral contrast and secondary to ascites and anasarca. No dilated small bowel loop identified. A faint and poorly visualized tubular structure medial to the cecum may represent a normal appendix. Lymphatic: Evaluation of the lymph nodes is very limited. Reproductive: The prostate and seminal vesicles are poorly visualized. Other: Diffuse edema and anasarca. Musculoskeletal: Osteosclerosis in keeping with renal osteodystrophy. No acute osseous pathology. Review of the MIP images confirms the above findings. IMPRESSION: 1. Very limited study due to streak artifact  caused by patient's arms and anasarca. No aortic aneurysm or dissection. 2. Moderate Aortic Atherosclerosis (ICD10-I70.0). 3. Bibasilar clusters of nodularity concerning for pneumonia. Clinical correlation is recommended. 4. Moderate cardiomegaly and multi vessel coronary vascular calcification and findings of right heart dysfunction correlation with clinical exam and echocardiogram recommended. Pericardial effusion measuring 14 mm in thickness.  5. Osteosclerosis in keeping with renal osteodystrophy. 6. Small ascites and anasarca. Electronically Signed   By: Anner Crete M.D.   On: 07/08/2017 22:01   Anti-infectives: Anti-infectives (From admission, onward)   Start     Dose/Rate Route Frequency Ordered Stop   07/11/17 1200  vancomycin (VANCOCIN) IVPB 750 mg/150 ml premix  Status:  Discontinued     750 mg 150 mL/hr over 60 Minutes Intravenous Every M-W-F (Hemodialysis) 07/08/17 1942 07/10/17 0523   07/10/17 0800  piperacillin-tazobactam (ZOSYN) IVPB 3.375 g     3.375 g 100 mL/hr over 30 Minutes Intravenous Every 6 hours 07/10/17 0523     07/08/17 2000  piperacillin-tazobactam (ZOSYN) IVPB 3.375 g  Status:  Discontinued     3.375 g 12.5 mL/hr over 240 Minutes Intravenous Every 12 hours 07/08/17 1942 07/10/17 0523   07/08/17 2000  vancomycin (VANCOCIN) 1,500 mg in sodium chloride 0.9 % 500 mL IVPB     1,500 mg 250 mL/hr over 120 Minutes Intravenous  Once 07/08/17 1942 07/09/17 0023   07/04/17 0800  dextrose 5 % with cefUROXime (ZINACEF) ADS Med    Comments:  Nyoka Cowden   : cabinet override      07/04/17 0800 07/04/17 2014   07/04/17 0754  cefUROXime (ZINACEF) 1.5 g in dextrose 5 % 50 mL IVPB     1.5 g 100 mL/hr over 30 Minutes Intravenous 30 min pre-op 07/04/17 0754 07/04/17 1155      Assessment/Plan: s/p Procedure(s): SKIN GRAFT SPLIT THICKNESS LEFT ARM DONOR SITE: LEFT ANTERIOR THIGH (Left) More stable after respiratory arrest.  Continue support from multiple medical  specialist   LOS: 8 days   Shiasia Porro 07/12/2017, 9:55 AM

## 2017-07-12 NOTE — Progress Notes (Signed)
Subjective:  No chest pain Breathing improved  Extubated 07/10/2017  Objective:  Vital Signs in the last 24 hours: Temp:  [96.4 F (35.8 C)-98 F (36.7 C)] 97.1 F (36.2 C) (01/01 0839) Pulse Rate:  [61-74] 64 (01/01 1047) Resp:  [11-27] 21 (01/01 1047) BP: (125-143)/(48-57) 143/57 (01/01 0839) SpO2:  [92 %-100 %] 96 % (01/01 1047) Arterial Line BP: (101-162)/(40-88) 115/47 (01/01 1047) Weight:  [74.3 kg (163 lb 12.8 oz)] 74.3 kg (163 lb 12.8 oz) (01/01 0400)  Intake/Output from previous day: 12/31 0701 - 01/01 0700 In: 1023.3 [P.O.:480; I.V.:313.3; Blood:30; IV Piggyback:200] Out: 3412  Intake/Output from this shift: Total I/O In: 1135 [P.O.:480; I.V.:20; Blood:585; IV Piggyback:50] Out: 952 [Other:952]  Physical Exam: Nursing note and vitals reviewed. Constitutional: He appears well-developed.  HENT:  Head: Normocephalic and atraumatic.  Eyes: Pupils are equal, round, and reactive to light.  Neck: Normal range of motion. No JVD present.  Cardiovascular: Normal rate.  Murmur (III/IV apical holosystolic murmur) heard. Irregularly irregular rhythm Diminished distal pulses  Respiratory: He has wheezes and b/l rhonchi. He has no rales.  GI: Bowel sounds are normal. He exhibits no distension. There is no tenderness.  Musculoskeletal: He exhibits no edema .  Normal range of motion Lymphadenopathy:    He has no cervical adenopathy.  Neurological:  Bilaterally equal pupils. No gross neurodeficits Skin: Skin is warm and dry. No erythema.  Left arm dressing in place Psychiatric: Stable     Lab Results: Recent Labs    07/11/17 0303 07/12/17 0400 07/12/17 1100  WBC 7.3 5.7  --   HGB 7.1* 6.9* 8.0*  PLT 158 149*  --    Recent Labs    07/11/17 1611 07/12/17 0400  NA 132* 133*  K 3.9 3.5  CL 97* 96*  CO2 24 26  GLUCOSE 117* 104*  BUN 18 14  CREATININE 3.22* 2.67*   Recent Labs    07/10/17 0404  TROPONINI 0.32*   Hepatic Function Panel Recent Labs     07/12/17 0400  ALBUMIN 2.3*   No results for input(s): CHOL in the last 72 hours. No results for input(s): PROTIME in the last 72 hours.  Imaging: CTA chest/abdomen 07/08/2017: IMPRESSION: 1. Very limited study due to streak artifact caused by patient's arms and anasarca. No aortic aneurysm or dissection. 2. Moderate Aortic Atherosclerosis (ICD10-I70.0). 3. Bibasilar clusters of nodularity concerning for pneumonia. Clinical correlation is recommended. 4. Moderate cardiomegaly and multi vessel coronary vascular calcification and findings of right heart dysfunction correlation with clinical exam and echocardiogram recommended. Pericardial effusion measuring 14 mm in thickness. 5. Osteosclerosis in keeping with renal osteodystrophy. 6. Small ascites and anasarca.    Cardiac Studies: Echocardiogram 06/01/2017: Study Conclusions  - Left ventricle: The cavity size was normal. Wall thickness was increased in a pattern of moderate LVH. The study is not technically sufficient to allow evaluation of LV diastolic function. - Mitral valve: Calcified annulus and basal leaflets severe MS based on gradients. Moderately calcified annulus. Moderately thickened leaflets . There was mild regurgitation. - Left atrium: The atrium was moderately dilated. - Right ventricle: The cavity size was moderately dilated. - Right atrium: The atrium was moderately dilated. - Atrial septum: No defect or patent foramen ovale was identified. - Tricuspid valve: There was severe regurgitation. - Pulmonary arteries: PA peak pressure: 95 mm Hg (S). - Pericardium, extracardiac: Small posterior pericardial effusion    Assessment: Acute hypoxic respiratory failure, extubated 07/10/2017 Bilateral viral pneumonia, RSV positive A. fib with RVR  Mild troponin elevation: Chronic myocardial injury, not MI CAD status post mid RCA PCI August 2018 Moderate calcific mitral stenosis Grp II Pulmonary  hypertension Recent left arm AV fistula ligation, skin necrosis End-stage renal disease on hemodialysis Anemia of chronic disease COPD Tobacco and polysubstance abuse   Recommendations: Tolerating diltiazem well. Off amiodarone.  High CHA2DS2VASc score, but not on warfarin at baseline due to noncompliance. Will discuss re initiaition outpatient.  Continue plavix. Management of acute respiratory failure and possible sepsis as per critical care team.  Cardiology will sign off. Please contact us back if any questions. Patient should call our office on discharge to make an appointment on [phone].   LOS: 8 days    Len Azeez J Symantha Steeber 07/12/2017, 11:47 AM  [place], MD [facility] Pager: [phone] Office: [phone] If no answer Cell [phone]

## 2017-07-12 NOTE — Progress Notes (Signed)
eLink Physician-Brief Progress Note Patient Name: [name] DOB: 12-08-1960 MRN: [id number]   Date of Service  07/12/2017  HPI/Events of Note  Multiple issues: 1. Pain - already on Morphine IV PRN and has allergy to Tramadol, therefore, can't order Oxycodone PO and 2. Flatulence.   eICU Interventions  Will order: 1. Mylicon 80 mg PO Q 6 hours PRN flatulence.      Intervention Category Major Interventions: Other:  Andras Grunewald Cornelia Copa 07/12/2017, 3:52 AM

## 2017-07-12 NOTE — Progress Notes (Signed)
Pharmacy Antibiotic Note  [name] is a 57 y.o. male admitted with SOB and cough. Pt is ESRD on HD - MWF, now on CRRT. Pharmacy dosing Zosyn for fever and possible PNA. Plans noted for IHD beginning 1/2.    Plan: -Continue Zosyn 3.375gm IV q6h -Consider 7 days total antibiotics? -Will follow renal function, cultures and clinical progress   Height: 5\' 9"  (175.3 cm) Weight: 163 lb 12.8 oz (74.3 kg) IBW/kg (Calculated) : 70.7  Temp (24hrs), Avg:97 F (36.1 C), Min:96.4 F (35.8 C), Max:98 F (36.7 C)  Recent Labs  Lab 07/08/17 1959 07/09/17 0456 07/10/17 0321 07/10/17 0404 07/10/17 0705 07/11/17 0303 07/11/17 1611 07/12/17 0400  WBC 10.8*  --  9.8 7.5  --  7.3  --  5.7  CREATININE 5.77*  --  7.47* 6.54* 7.43* 4.42* 3.22* 2.67*  VANCORANDOM  --  25  --   --   --   --   --   --     Estimated Creatinine Clearance: 31.6 mL/min (A) (by C-G formula based on SCr of 2.67 mg/dL (H)).    Allergies  Allergen Reactions  . Aspirin Other (See Comments)    STOMACH PAIN  . Clonidine Derivatives Itching  . Tramadol Itching   Antimicrobials this admission: 12/28 vancomycin > 12/28 zosyn >  Dose adjustments this admission: 12/29 VR: 25 mcg/ml > no adjustments  Microbiology results: 12/28 blood cx: ngtd  12/29 Resp panel: RSV   [name], Pharm D 07/12/2017 11:06 AM

## 2017-07-12 NOTE — Progress Notes (Signed)
CRITICAL VALUE ALERT  Critical Value:Hemoglobin 6.9  Date & Time Notied:  07/12/17 0545  Provider Notified: [id number] Elink  Orders Received/Actions taken: Awaiting orders

## 2017-07-12 NOTE — Progress Notes (Signed)
Patient ID: [name], male   DOB: Feb 18, 1961, 57 y.o.   MRN: [id number] S:Feels better O:BP (!) 143/57   Pulse 63   Temp (!) 97.1 F (36.2 C) (Axillary)   Resp 12   Ht 5\' 9"  (1.753 m)   Wt 74.3 kg (163 lb 12.8 oz)   SpO2 100%   BMI 24.19 kg/m   Intake/Output Summary (Last 24 hours) at 07/12/2017 0932 Last data filed at 07/12/2017 0839 Gross per 24 hour  Intake 1535 ml  Output 3297 ml  Net -1762 ml   Intake/Output: I/O last 3 completed shifts: In: 2061.8 [P.O.:580; I.V.:1151.8; Blood:30; IV Piggyback:300] Out: 2409 [Other:5154]  Intake/Output this shift:  Total I/O In: 605 [I.V.:20; Blood:585] Out: 164 [Other:164] Weight change: -0.8 kg (-12.2 oz) Gen: NAD CVS: no rub Resp: scattered rhonchi Abd: benign Ext: no edema, LUE AVF wrapped, +thrill  Recent Labs  Lab 07/06/17 0738 07/08/17 1352 07/08/17 1959 07/09/17 1042 07/09/17 1628 07/10/17 0321 07/10/17 0404 07/10/17 0423 07/10/17 0705 07/11/17 0303 07/11/17 1611 07/12/17 0400  NA 134* 132* 134*  --   --  133* 135 135 134* 134* 132* 133*  K 5.9* 4.3 4.7  --   --  5.6* 4.3 4.9 4.8 4.5 3.9 3.5  CL 92* 94* 95*  --   --  95* 101  --  96* 95* 97* 96*  CO2 29 28 26   --   --  21* 21*  --  23 23 24 26   GLUCOSE 97 104* 97  --   --  118* 117* 133* 128* 99 117* 104*  BUN 37* 28* 23*  --   --  43* 38*  --  43* 25* 18 14  CREATININE 9.26* 6.67* 5.77*  --   --  7.47* 6.54*  --  7.43* 4.42* 3.22* 2.67*  ALBUMIN 3.1* 2.9*  --   --   --  3.0*  --   --  2.8* 2.6* 2.3* 2.3*  CALCIUM 9.4 9.0 9.6  --   --  9.3 8.1*  --  9.3 9.0 8.8* 8.9  PHOS 5.2* 3.6  --  5.9* 5.4* 5.9*  5.9*  --   --  5.5* 3.2 2.8 2.2*   Liver Function Tests: Recent Labs  Lab 07/11/17 0303 07/11/17 1611 07/12/17 0400  ALBUMIN 2.6* 2.3* 2.3*   No results for input(s): LIPASE, AMYLASE in the last 168 hours. No results for input(s): AMMONIA in the last 168 hours. CBC: Recent Labs  Lab 07/08/17 1959 07/10/17 0321 07/10/17 0404 07/10/17 0423  07/11/17 0303 07/12/17 0400  WBC 10.8* 9.8 7.5  --  7.3 5.7  HGB 7.6* 8.3* 7.1* 8.5* 7.1* 6.9*  HCT 23.2* 25.4* 21.8* 25.0* 21.4* 20.4*  MCV 83.8 82.5 82.3  --  80.1 78.8  PLT 121* 164 139*  --  158 149*   Cardiac Enzymes: Recent Labs  Lab 07/10/17 0404  TROPONINI 0.32*   CBG: Recent Labs  Lab 07/11/17 1655 07/11/17 2005 07/12/17 0015 07/12/17 0406 07/12/17 0800  GLUCAP 110* 136* 116* 118* 101*    Iron Studies: No results for input(s): IRON, TIBC, TRANSFERRIN, FERRITIN in the last 72 hours. Studies/Results: Dg Abd 1 View  Result Date: 07/10/2017 CLINICAL DATA:  Abdominal distention. EXAM: ABDOMEN - 1 VIEW COMPARISON:  04/23/2017 FINDINGS: There is gaseous distension of the colon. Moderate stool burden identified within the rectum. No abnormal small bowel dilatation. IMPRESSION: 1. Gaseous distension of the colon with moderate stool burden in the rectum. Correlate for any  clinical signs or symptoms of rectal impaction or constipation. Electronically Signed   By: Kerby Moors M.D.   On: 07/10/2017 10:32   Dg Chest Port 1 View  Result Date: 07/12/2017 CLINICAL DATA:  Acute respiratory failure EXAM: PORTABLE CHEST 1 VIEW COMPARISON:  07/10/2017 chest radiograph. FINDINGS: Right internal jugular central venous catheter terminates over the right atrium. Stable cardiomediastinal silhouette with mild cardiomegaly and aortic atherosclerosis. No pneumothorax. No pleural effusion. No overt pulmonary edema. Stable mild patchy left retrocardiac lung opacity. No new consolidative airspace disease. IMPRESSION: Stable chest radiograph. Mild cardiomegaly without overt pulmonary edema. Stable left retrocardiac patchy opacity, favor atelectasis. Electronically Signed   By: Ilona Sorrel M.D.   On: 07/12/2017 07:56   Dg Chest Port 1 View  Result Date: 07/10/2017 CLINICAL DATA:  57 year old male with a history of intubation EXAM: PORTABLE CHEST 1 VIEW COMPARISON:  Chest x-ray 07/10/2017,  07/08/2017, CT 07/08/2017 FINDINGS: Cardiomediastinal silhouette unchanged with cardiomegaly. No enlarged central vasculature. No interlobular septal thickening. No pneumothorax.  No confluent airspace disease. Unchanged position of endotracheal tube, terminating approximately 6.3 cm above the carina. Unchanged gastric tube terminating out of the field of view. Side port terminates in the distal esophagus. Unchanged position of right IJ hemodialysis catheter, terminating at the superior cavoatrial junction. Defibrillator pads project over the chest. IMPRESSION: Unchanged appearance of the chest x-ray, with no evidence of edema or confluent airspace disease. Cardiomegaly. Unchanged endotracheal tube, right IJ hemodialysis catheter, defibrillator pads. Gastric tube projects over the mediastinum terminating out of the field of view, with the side-port in the distal esophagus. Electronically Signed   By: Corrie Mckusick D.O.   On: 07/10/2017 10:35   . arformoterol  15 mcg Nebulization BID  . bacitracin   Topical Daily  . budesonide (PULMICORT) nebulizer solution  0.5 mg Nebulization BID  . Chlorhexidine Gluconate Cloth  6 each Topical Daily  . clopidogrel  75 mg Oral Daily  . [START ON 07/13/2017] darbepoetin (ARANESP) injection - NON-DIALYSIS  150 mcg Subcutaneous Once  . diltiazem  120 mg Oral Daily  . docusate  100 mg Oral BID  . heparin  5,000 Units Subcutaneous Q8H  . mouth rinse  15 mL Mouth Rinse BID  . multivitamin  1 tablet Oral QHS  . nicotine  14 mg Transdermal Daily  . pantoprazole  40 mg Oral Daily    BMET    Component Value Date/Time   NA 133 (L) 07/12/2017 0400   K 3.5 07/12/2017 0400   CL 96 (L) 07/12/2017 0400   CO2 26 07/12/2017 0400   GLUCOSE 104 (H) 07/12/2017 0400   BUN 14 07/12/2017 0400   CREATININE 2.67 (H) 07/12/2017 0400   CREATININE 9.18 (H) 02/15/2017 1425   CALCIUM 8.9 07/12/2017 0400   CALCIUM 8.1 (L) 07/11/2011 1117   GFRNONAA 25 (L) 07/12/2017 0400   GFRNONAA 6  (L) 02/15/2017 1425   GFRAA 30 (L) 07/12/2017 0400   GFRAA 7 (L) 02/15/2017 1425   CBC    Component Value Date/Time   WBC 5.7 07/12/2017 0400   RBC 2.59 (L) 07/12/2017 0400   HGB 6.9 (LL) 07/12/2017 0400   HCT 20.4 (L) 07/12/2017 0400   PLT 149 (L) 07/12/2017 0400   MCV 78.8 07/12/2017 0400   MCH 26.6 07/12/2017 0400   MCHC 33.8 07/12/2017 0400   RDW 21.0 (H) 07/12/2017 0400   LYMPHSABS 1.5 06/19/2017 2030   MONOABS 0.8 06/19/2017 2030   EOSABS 0.0 06/19/2017 2030   BASOSABS 0.0  06/19/2017 2030   Dialysis Orders: MWF at [facility] 4h 73kg Hep 3000 400/800 2/2 bath  - Mircera 275mcg IV q 2 weeks (last 12/19) - Cinacalcet 120mg  TIW   Assessment/Plan: 1. LUE skin necrosis after AVF infiltration- s/p skin graft 07/04/17 2. Acute on Chronic respiratory failure- +RSV, viral pneumonia s/p extubation 07/10/17 improved with UF 3. A fib with RVR- transition to dilt per Cardiology 4. ESRD on CVVHD since 07/09/17.  Has done well and will stop CVVHD as he is off pressors and transition to IHD tomorrow to keep on his outpatient schedule. 5. Anemia: s/p transfusion x 1 today.  Follow H/H and transfuse again if continues to drop 6. CKD-MBD: stable 7. Nutrition: renal when able to take po 8. Hep B- continue with contact precautions 9. CAD- per Cardiology    Donetta Potts, MD [facility] [phone]

## 2017-07-12 NOTE — Progress Notes (Signed)
[facility]  S: feels better, appetite OK, breathing much improved  O:  Vitals:   07/12/17 0839 07/12/17 0900 07/12/17 1000 07/12/17 1047  BP: (!) 143/57     Pulse: 61 63 66 64  Resp: (!) 21 12 16  (!) 21  Temp: (!) 97.1 F (36.2 C)     TempSrc: Axillary     SpO2: 100% 100% 96% 96%  Weight:      Height:       3L [place]  General:  Resting comfortably in bed HENT: NCAT OP clear PULM: Wheezing bilaterally, normal effort CV: RRR, no mgr GI: BS+, soft, nontender MSK: normal bulk and tone Neuro: awake, alert, no distress, MAEW  CXR images reviewed no infiltrate, HD cath in R IJ  COPD exacerbation with RSV infection: stop zosyn, continue brovana/pulmicort, start prednisone 20mg  daily x 5 days, continue duoneb prn; suspect he will wheeze for several weeks  Acute respiratory failure with hypoxemia: remarkably improved, monitor O2 saturation, continue to use to maintain O2 saturation > 90%  ESRD: per renal  Skin necrosis/s/p grafting: per vascular  Afib:  Per cardilogy tele  Will move to floor, tele bed  Roselie Awkward, MD [facility] Pager: [phone] Cell: [phone] After 3pm or if no response, call [phone]

## 2017-07-13 DIAGNOSIS — J439 Emphysema, unspecified: Secondary | ICD-10-CM

## 2017-07-13 LAB — BPAM RBC
Blood Product Expiration Date: 201901072359
ISSUE DATE / TIME: 201901010648
Unit Type and Rh: 9500

## 2017-07-13 LAB — RENAL FUNCTION PANEL
Albumin: 2.5 g/dL — ABNORMAL LOW (ref 3.5–5.0)
Albumin: 2.9 g/dL — ABNORMAL LOW (ref 3.5–5.0)
Anion gap: 10 (ref 5–15)
Anion gap: 11 (ref 5–15)
BUN: 15 mg/dL (ref 6–20)
BUN: 5 mg/dL — ABNORMAL LOW (ref 6–20)
CO2: 24 mmol/L (ref 22–32)
CO2: 27 mmol/L (ref 22–32)
Calcium: 8.9 mg/dL (ref 8.9–10.3)
Calcium: 9 mg/dL (ref 8.9–10.3)
Chloride: 97 mmol/L — ABNORMAL LOW (ref 101–111)
Chloride: 95 mmol/L — ABNORMAL LOW (ref 101–111)
Creatinine, Ser: 3.45 mg/dL — ABNORMAL HIGH (ref 0.61–1.24)
Creatinine, Ser: 2.19 mg/dL — ABNORMAL HIGH (ref 0.61–1.24)
GFR calc Af Amer: 22 mL/min — ABNORMAL LOW (ref >60)
GFR calc Af Amer: 37 mL/min — ABNORMAL LOW (ref >60)
GFR calc non Af Amer: 19 mL/min — ABNORMAL LOW (ref >60)
GFR calc non Af Amer: 32 mL/min — ABNORMAL LOW (ref >60)
Glucose, Bld: 113 mg/dL — ABNORMAL HIGH (ref 65–99)
Glucose, Bld: 127 mg/dL — ABNORMAL HIGH (ref 65–99)
Phosphorus: 2.1 mg/dL — ABNORMAL LOW (ref 2.5–4.6)
Phosphorus: 1.8 mg/dL — ABNORMAL LOW (ref 2.5–4.6)
Potassium: 3.6 mmol/L (ref 3.5–5.1)
Potassium: 3 mmol/L — ABNORMAL LOW (ref 3.5–5.1)
Sodium: 131 mmol/L — ABNORMAL LOW (ref 135–145)
Sodium: 133 mmol/L — ABNORMAL LOW (ref 135–145)

## 2017-07-13 LAB — CULTURE, BLOOD (ROUTINE X 2)
Culture: NO GROWTH
Culture: NO GROWTH
Special Requests: ADEQUATE

## 2017-07-13 LAB — TYPE AND SCREEN
ABO/RH(D): O NEG
Antibody Screen: NEGATIVE
Unit division: 0

## 2017-07-13 LAB — APTT: aPTT: 41 seconds — ABNORMAL HIGH (ref 24–36)

## 2017-07-13 LAB — GLUCOSE, CAPILLARY
Glucose-Capillary: 109 mg/dL — ABNORMAL HIGH (ref 65–99)
Glucose-Capillary: 129 mg/dL — ABNORMAL HIGH (ref 65–99)
Glucose-Capillary: 157 mg/dL — ABNORMAL HIGH (ref 65–99)

## 2017-07-13 LAB — MAGNESIUM: Magnesium: 2.4 mg/dL (ref 1.7–2.4)

## 2017-07-13 MED ORDER — GUAIFENESIN 100 MG/5ML PO SOLN
5.0000 mL | Freq: Three times a day (TID) | ORAL | Status: DC | PRN
  Administered 2017-07-13 – 2017-07-15 (×4): 100 mg via ORAL
  Filled 2017-07-13 (×4): qty 5

## 2017-07-13 MED ORDER — ATORVASTATIN CALCIUM 20 MG PO TABS
20.0000 mg | ORAL_TABLET | Freq: Every day | ORAL | Status: DC
Start: 2017-07-13 — End: 2017-07-18
  Administered 2017-07-13 – 2017-07-17 (×4): 20 mg via ORAL
  Filled 2017-07-13 (×6): qty 1

## 2017-07-13 NOTE — Progress Notes (Addendum)
  Progress Note    07/13/2017 8:07 AM 9 Days Post-Op  Subjective:  Wants to go home  Afebrile HR 70's-80's NSR 179'X-505'W systolic 97% RA  Vitals:   07/13/17 0600 07/13/17 0744  BP:    Pulse: 70   Resp: (!) 21   Temp:  97.9 F (36.6 C)  SpO2: 95%     Physical Exam: Cardiac:  regular Lungs:  Extubated a couple of days ago and tolerating.  Non labored breathing. Incisions:  Left upper arm skin graft is healing nicely.   CBC    Component Value Date/Time   WBC 5.7 07/12/2017 0400   RBC 2.59 (L) 07/12/2017 0400   HGB 8.0 (L) 07/12/2017 1100   HCT 23.9 (L) 07/12/2017 1100   PLT 149 (L) 07/12/2017 0400   MCV 78.8 07/12/2017 0400   MCH 26.6 07/12/2017 0400   MCHC 33.8 07/12/2017 0400   RDW 21.0 (H) 07/12/2017 0400   LYMPHSABS 1.5 06/19/2017 2030   MONOABS 0.8 06/19/2017 2030   EOSABS 0.0 06/19/2017 2030   BASOSABS 0.0 06/19/2017 2030    BMET    Component Value Date/Time   NA 131 (L) 07/13/2017 0422   K 3.6 07/13/2017 0422   CL 97 (L) 07/13/2017 0422   CO2 24 07/13/2017 0422   GLUCOSE 113 (H) 07/13/2017 0422   BUN 15 07/13/2017 0422   CREATININE 3.45 (H) 07/13/2017 0422   CREATININE 9.18 (H) 02/15/2017 1425   CALCIUM 8.9 07/13/2017 0422   CALCIUM 8.1 (L) 07/11/2011 1117   GFRNONAA 19 (L) 07/13/2017 0422   GFRNONAA 6 (L) 02/15/2017 1425   GFRAA 22 (L) 07/13/2017 0422   GFRAA 7 (L) 02/15/2017 1425    INR    Component Value Date/Time   INR 1.20 06/14/2017 0936     Intake/Output Summary (Last 24 hours) at 07/13/2017 9480 Last data filed at 07/13/2017 0400 Gross per 24 hour  Intake 2195 ml  Output 1064 ml  Net 1131 ml     Assessment:  57 y.o. male is s/p:  Split thickness skin graft  9 Days Post-Op  Plan: -left upper arm skin graft is healing nicely. -afib-per cards not on coumadin due to noncompliance-will discuss at outpatient visit -continue Plavix -pt tolerating extubation-transfer to floor per CCM -ESRD per renal -DVT prophylaxis:  SQ  heparin -appreciate CCM/cards/renal assistance with this pt   Leontine Locket, PA-C [facility] [phone] 07/13/2017 8:07 AM

## 2017-07-13 NOTE — Progress Notes (Signed)
Patient c/o of SOB O2 Stats 100% on room air patient is coughing up yellow thick sputum. Placed on 2L for patient comfort. Dr. Elsworth Soho paged for cough medication. Will continue to monitor. Arthor Captain LPN

## 2017-07-13 NOTE — Progress Notes (Signed)
.. ..  Name: [name] MRN: [id number] DOB: [birth year]    ADMISSION DATE:  07/04/2017 CONSULTATION DATE:  07/08/2017  REFERRING MD :  Adele Barthel MD  CHIEF COMPLAINT:  Dyspnea and cough  BRIEF PATIENT DESCRIPTION:  57 yo male smoker presented with Lt arm wound.  He developed fever, respiratory distress and required intubation.  PMHx of Pulmonary HTN, mild COPD, ESRD, HTN, CAD s/p stent, mitral stenosis, GERD, Cocaine abuse, Hep B and C.  SUBJECTIVE:  Now on RA, comfortable.   VITAL SIGNS: BP (!) 143/57   Pulse 70   Temp 97.9 F (36.6 C) (Oral)   Resp (!) 21   Ht 5\' 9"  (1.753 m)   Wt 74.7 kg (164 lb 10.9 oz)   SpO2 95%   BMI 24.32 kg/m   INTAKE/OUTPUT: I/O last 3 completed shifts: In: 2805 [P.O.:1680; I.V.:260; Blood:615; IV Piggyback:250] Out: 2904 [Other:2903; Stool:1]  PHYSICAL EXAMINATION:  General -sleeping comfortably Eyes -pupils reactive, ENT -no oral lesions, cough effective Cardiac -irregularly irregular, no murmur Chest -normal respiratory pattern, mild expiratory wheeze Abd -soft, nontender, benign, positive bowel sounds Ext no significant edema Skin -left arm dressed, left thigh graft site clean Neuro -wakes easily, interacts appropriately, moves all extremities   LABS: CBC Recent Labs    07/11/17 0303 07/12/17 0400 07/12/17 1100  WBC 7.3 5.7  --   HGB 7.1* 6.9* 8.0*  HCT 21.4* 20.4* 23.9*  PLT 158 149*  --     Coag's Recent Labs    07/11/17 0303 07/12/17 0400 07/13/17 0422  APTT 42* 44* 41*    BMET Recent Labs    07/11/17 1611 07/12/17 0400 07/13/17 0422  NA 132* 133* 131*  K 3.9 3.5 3.6  CL 97* 96* 97*  CO2 24 26 24   BUN 18 14 15   CREATININE 3.22* 2.67* 3.45*  GLUCOSE 117* 104* 113*    Electrolytes Recent Labs    07/11/17 0303 07/11/17 1611 07/12/17 0400 07/13/17 0422  CALCIUM 9.0 8.8* 8.9 8.9  MG 2.3  --  2.4 2.4  PHOS 3.2 2.8 2.2* 2.1*    Sepsis Markers No results for input(s): PROCALCITON,  O2SATVEN in the last 72 hours.  Invalid input(s): LACTICACIDVEN  ABG Recent Labs    07/10/17 1932 07/11/17 0730 07/12/17 0400  PHART 7.387 7.313* 7.408  PCO2ART 39.7 47.9 43.6  PO2ART 87.0 81.0* 64.2*    Liver Enzymes Recent Labs    07/11/17 1611 07/12/17 0400 07/13/17 0422  ALBUMIN 2.3* 2.3* 2.5*    Cardiac Enzymes No results for input(s): TROPONINI, PROBNP in the last 72 hours.  Glucose Recent Labs    07/11/17 2005 07/12/17 0015 07/12/17 0406 07/12/17 0800 07/12/17 1218 07/12/17 1553  GLUCAP 136* 116* 118* 101* 125* 114*    Imaging Dg Chest Port 1 View  Result Date: 07/12/2017 CLINICAL DATA:  Acute respiratory failure EXAM: PORTABLE CHEST 1 VIEW COMPARISON:  07/10/2017 chest radiograph. FINDINGS: Right internal jugular central venous catheter terminates over the right atrium. Stable cardiomediastinal silhouette with mild cardiomegaly and aortic atherosclerosis. No pneumothorax. No pleural effusion. No overt pulmonary edema. Stable mild patchy left retrocardiac lung opacity. No new consolidative airspace disease. IMPRESSION: Stable chest radiograph. Mild cardiomegaly without overt pulmonary edema. Stable left retrocardiac patchy opacity, favor atelectasis. Electronically Signed   By: Ilona Sorrel M.D.   On: 07/12/2017 07:56    STUDIES: Echo 06/01/17 >> mod LVH, severe MS, PAS 95 mmHg CT angio chest/abd/pelvis 07/08/17 >> atherosclerosis, b/l nodular clusters concerning for PNA, CM  CULTURES: Blood 12/28 >>  Respiratory viral panel 12/29 >> RSV  ANTIBIOTICS: Zosyn 12/28 >> 1/1 Vancomycin 12/28 >> 12/29  LINES/TUBES: RT [place] HD catheter >>  ETT 12/28 >> 12/30  EVENTS: 12/24 Admit, split thickness skin graft Lt arm  12/28 VDRF 12/30 WCT, cardiology consulted  DISCUSSION: 57 yo male smoker, history ESRD, with acute hypoxic respiratory failure from AE-COPD, RSV pneumonitis, component volume overload  ASSESSMENT / PLAN:  Acute hypoxic respiratory  failure. Viral pneumonitis with suspected bacterial superinfection. Tobacco abuse. Hx of mild COPD. Significantly improved Plan finish 5 days pred 20mg , then stop abx completed / d/c'd 1/1 Back to his home symbicort at time of d/c  A fib with RVR.  Current heart rate 114 Wide complex tachycardia. Hx of CAD, HTN, severe MS, pulmonary HTN. Off amiodarone, on dilt CD,  Continue Plavix  ESRD on HD. Intermittent HD per nephrology plans.   Please call if we can assist further  Baltazar Apo, MD, PhD 07/13/2017, 8:22 AM [facility] [phone] or if no answer [phone]

## 2017-07-13 NOTE — Progress Notes (Signed)
Patient ID: [name], male   DOB: 09-17-1960, 57 y.o.   MRN: [id number] S:No new complaints O:BP (!) 143/87 (BP Location: Right Leg)   Pulse 75   Temp (!) 96.9 F (36.1 C) (Axillary)   Resp 18   Ht 5\' 9"  (1.753 m)   Wt 74.7 kg (164 lb 10.9 oz)   SpO2 100%   BMI 24.32 kg/m   Intake/Output Summary (Last 24 hours) at 07/13/2017 1149 Last data filed at 07/13/2017 0900 Gross per 24 hour  Intake 1440 ml  Output 276 ml  Net 1164 ml   Intake/Output: I/O last 3 completed shifts: In: 2805 [P.O.:1680; I.V.:260; Blood:615; IV Piggyback:250] Out: 2904 [Other:2903; Stool:1]  Intake/Output this shift:  Total I/O In: 360 [P.O.:360] Out: -  Weight change: 0.4 kg (14.1 oz) Gen: NAD CVS: no rub Resp: scattered rhonchi Abd: benign Ext: left upper arm under wrapping. No edema  Recent Labs  Lab 07/08/17 1352  07/09/17 1628 07/10/17 0321 07/10/17 0404 07/10/17 0423 07/10/17 0705 07/11/17 0303 07/11/17 1611 07/12/17 0400 07/13/17 0422  NA 132*   < >  --  133* 135 135 134* 134* 132* 133* 131*  K 4.3   < >  --  5.6* 4.3 4.9 4.8 4.5 3.9 3.5 3.6  CL 94*   < >  --  95* 101  --  96* 95* 97* 96* 97*  CO2 28   < >  --  21* 21*  --  23 23 24 26 24   GLUCOSE 104*   < >  --  118* 117* 133* 128* 99 117* 104* 113*  BUN 28*   < >  --  43* 38*  --  43* 25* 18 14 15   CREATININE 6.67*   < >  --  7.47* 6.54*  --  7.43* 4.42* 3.22* 2.67* 3.45*  ALBUMIN 2.9*  --   --  3.0*  --   --  2.8* 2.6* 2.3* 2.3* 2.5*  CALCIUM 9.0   < >  --  9.3 8.1*  --  9.3 9.0 8.8* 8.9 8.9  PHOS 3.6   < > 5.4* 5.9*  5.9*  --   --  5.5* 3.2 2.8 2.2* 2.1*   < > = values in this interval not displayed.   Liver Function Tests: Recent Labs  Lab 07/11/17 1611 07/12/17 0400 07/13/17 0422  ALBUMIN 2.3* 2.3* 2.5*   No results for input(s): LIPASE, AMYLASE in the last 168 hours. No results for input(s): AMMONIA in the last 168 hours. CBC: Recent Labs  Lab 07/08/17 1959 07/10/17 0321 07/10/17 0404  07/11/17 0303  07/12/17 0400 07/12/17 1100  WBC 10.8* 9.8 7.5  --  7.3 5.7  --   HGB 7.6* 8.3* 7.1*   < > 7.1* 6.9* 8.0*  HCT 23.2* 25.4* 21.8*   < > 21.4* 20.4* 23.9*  MCV 83.8 82.5 82.3  --  80.1 78.8  --   PLT 121* 164 139*  --  158 149*  --    < > = values in this interval not displayed.   Cardiac Enzymes: Recent Labs  Lab 07/10/17 0404  TROPONINI 0.32*   CBG: Recent Labs  Lab 07/12/17 0800 07/12/17 1218 07/12/17 1553 07/13/17 0740 07/13/17 1112  GLUCAP 101* 125* 114* 109* 129*    Iron Studies: No results for input(s): IRON, TIBC, TRANSFERRIN, FERRITIN in the last 72 hours. Studies/Results: Dg Chest Port 1 View  Result Date: 07/12/2017 CLINICAL DATA:  Acute respiratory failure EXAM: PORTABLE CHEST  1 VIEW COMPARISON:  07/10/2017 chest radiograph. FINDINGS: Right internal jugular central venous catheter terminates over the right atrium. Stable cardiomediastinal silhouette with mild cardiomegaly and aortic atherosclerosis. No pneumothorax. No pleural effusion. No overt pulmonary edema. Stable mild patchy left retrocardiac lung opacity. No new consolidative airspace disease. IMPRESSION: Stable chest radiograph. Mild cardiomegaly without overt pulmonary edema. Stable left retrocardiac patchy opacity, favor atelectasis. Electronically Signed   By: Ilona Sorrel M.D.   On: 07/12/2017 07:56   . arformoterol  15 mcg Nebulization BID  . bacitracin   Topical Daily  . budesonide (PULMICORT) nebulizer solution  0.5 mg Nebulization BID  . Chlorhexidine Gluconate Cloth  6 each Topical Daily  . clopidogrel  75 mg Oral Daily  . darbepoetin (ARANESP) injection - NON-DIALYSIS  150 mcg Subcutaneous Once  . diltiazem  120 mg Oral Daily  . docusate  100 mg Oral BID  . heparin  5,000 Units Subcutaneous Q8H  . multivitamin  1 tablet Oral QHS  . nicotine  14 mg Transdermal Daily  . pantoprazole  40 mg Oral Daily  . predniSONE  20 mg Oral Q breakfast    BMET    Component Value Date/Time   NA 131 (L)  07/13/2017 0422   K 3.6 07/13/2017 0422   CL 97 (L) 07/13/2017 0422   CO2 24 07/13/2017 0422   GLUCOSE 113 (H) 07/13/2017 0422   BUN 15 07/13/2017 0422   CREATININE 3.45 (H) 07/13/2017 0422   CREATININE 9.18 (H) 02/15/2017 1425   CALCIUM 8.9 07/13/2017 0422   CALCIUM 8.1 (L) 07/11/2011 1117   GFRNONAA 19 (L) 07/13/2017 0422   GFRNONAA 6 (L) 02/15/2017 1425   GFRAA 22 (L) 07/13/2017 0422   GFRAA 7 (L) 02/15/2017 1425   CBC    Component Value Date/Time   WBC 5.7 07/12/2017 0400   RBC 2.59 (L) 07/12/2017 0400   HGB 8.0 (L) 07/12/2017 1100   HCT 23.9 (L) 07/12/2017 1100   PLT 149 (L) 07/12/2017 0400   MCV 78.8 07/12/2017 0400   MCH 26.6 07/12/2017 0400   MCHC 33.8 07/12/2017 0400   RDW 21.0 (H) 07/12/2017 0400   LYMPHSABS 1.5 06/19/2017 2030   MONOABS 0.8 06/19/2017 2030   EOSABS 0.0 06/19/2017 2030   BASOSABS 0.0 06/19/2017 2030     Dialysis Orders: MWF at [facility] 4h 73kg Hep 3000 400/800 2/2 bath  - Mircera 268mcg IV q 2 weeks (last 12/19) - Cinacalcet 120mg  TIW   Assessment/Plan: 1. LUE skin necrosis after AVF infiltration- s/p skin graft 07/04/17 2. Acute on Chronic respiratory failure- +RSV, viral pneumonia s/p extubation 07/10/17 improved with UF 3. A fib with RVR- transition to dilt per Cardiology 4. ESRDon CVVHD since 07/09/17 and stopped 07/12/17. Has done well and is off pressors and transition to IHD today to keep on his outpatient schedule. 5. Anemia:s/p transfusion x 1 today.  Follow H/H and transfuse again if continues to drop 6. CKD-MBD:stable 7. Nutrition:renal when able to take po 8. Hep B- continue with contact precautions 9. CAD- per Cardiology  Donetta Potts, MD [facility] [phone]

## 2017-07-14 ENCOUNTER — Inpatient Hospital Stay (HOSPITAL_COMMUNITY): Payer: Medicare Other

## 2017-07-14 ENCOUNTER — Telehealth: Payer: Self-pay | Admitting: Vascular Surgery

## 2017-07-14 DIAGNOSIS — B974 Respiratory syncytial virus as the cause of diseases classified elsewhere: Secondary | ICD-10-CM

## 2017-07-14 LAB — RENAL FUNCTION PANEL
Albumin: 2.6 g/dL — ABNORMAL LOW (ref 3.5–5.0)
Anion gap: 9 (ref 5–15)
BUN: 9 mg/dL (ref 6–20)
CO2: 28 mmol/L (ref 22–32)
Calcium: 9.2 mg/dL (ref 8.9–10.3)
Chloride: 96 mmol/L — ABNORMAL LOW (ref 101–111)
Creatinine, Ser: 3.17 mg/dL — ABNORMAL HIGH (ref 0.61–1.24)
GFR calc Af Amer: 24 mL/min — ABNORMAL LOW (ref >60)
GFR calc non Af Amer: 21 mL/min — ABNORMAL LOW (ref >60)
Glucose, Bld: 118 mg/dL — ABNORMAL HIGH (ref 65–99)
Phosphorus: 2.3 mg/dL — ABNORMAL LOW (ref 2.5–4.6)
Potassium: 3 mmol/L — ABNORMAL LOW (ref 3.5–5.1)
Sodium: 133 mmol/L — ABNORMAL LOW (ref 135–145)

## 2017-07-14 LAB — APTT: aPTT: 47 seconds — ABNORMAL HIGH (ref 24–36)

## 2017-07-14 LAB — MAGNESIUM: Magnesium: 2.1 mg/dL (ref 1.7–2.4)

## 2017-07-14 MED ORDER — CLOPIDOGREL BISULFATE 75 MG PO TABS: 75.0000 mg | ORAL_TABLET | Freq: Every day | ORAL | 3 refills | Status: DC

## 2017-07-14 MED ORDER — IPRATROPIUM-ALBUTEROL 0.5-2.5 (3) MG/3ML IN SOLN
3.0000 mL | RESPIRATORY_TRACT | Status: DC
  Administered 2017-07-14 – 2017-07-15 (×6): 3 mL via RESPIRATORY_TRACT
  Filled 2017-07-14 (×4): qty 3

## 2017-07-14 MED ORDER — PREDNISONE 20 MG PO TABS: 20.0000 mg | ORAL_TABLET | Freq: Every day | ORAL | 0 refills | Status: DC

## 2017-07-14 MED ORDER — DARBEPOETIN ALFA 200 MCG/0.4ML IJ SOSY
200.0000 ug | PREFILLED_SYRINGE | INTRAMUSCULAR | Status: DC
  Filled 2017-07-14: qty 0.4

## 2017-07-14 MED ORDER — PRO-STAT SUGAR FREE PO LIQD
30.0000 mL | Freq: Two times a day (BID) | ORAL | Status: DC
  Administered 2017-07-14 – 2017-07-15 (×3): 30 mL via ORAL
  Filled 2017-07-14 (×2): qty 30

## 2017-07-14 MED ORDER — NEPRO/CARBSTEADY PO LIQD
237.0000 mL | Freq: Two times a day (BID) | ORAL | Status: DC
  Administered 2017-07-14 – 2017-07-15 (×2): 237 mL via ORAL
  Filled 2017-07-14 (×5): qty 237

## 2017-07-14 MED ORDER — METHYLPREDNISOLONE SODIUM SUCC 125 MG IJ SOLR
60.0000 mg | Freq: Four times a day (QID) | INTRAMUSCULAR | Status: DC
  Administered 2017-07-14 – 2017-07-15 (×4): 60 mg via INTRAVENOUS
  Filled 2017-07-14 (×4): qty 2

## 2017-07-14 NOTE — Progress Notes (Signed)
[facility] Progress Note   Subjective:  C/o coughing up mucus and breathing a little heavy. Offered extra HD today, but he doesn't think it is needed. Denies CP or arm/leg pains at this time.  Objective Vitals:   07/13/17 1900 07/13/17 1930 07/13/17 1957 07/14/17 0457  BP: 133/65 134/65 110/64 125/68  Pulse: 88 82 84 80  Resp:   18 18  Temp:   98.2 F (36.8 C) 98.4 F (36.9 C)  TempSrc:   Oral Oral  SpO2:   100% 100%  Weight:   70.9 kg (156 lb 4.9 oz)   Height:       Physical Exam General: Well appearing man, NAD. On nasal oxygen. Heart: RRR; 2/6 murmur Lungs: Coarse breath sounds throughout, no wheezing or rales Extremities: 2+ pedal edema, L thigh bandaged (graft donor site), LUE bandaged (s/p skin graft) Dialysis Access: TDC in R chest  Additional Objective Labs: Basic Metabolic Panel: Recent Labs  Lab 07/12/17 0400 07/13/17 0422 07/13/17 2138  NA 133* 131* 133*  K 3.5 3.6 3.0*  CL 96* 97* 95*  CO2 26 24 27   GLUCOSE 104* 113* 127*  BUN 14 15 5*  CREATININE 2.67* 3.45* 2.19*  CALCIUM 8.9 8.9 9.0  PHOS 2.2* 2.1* 1.8*   Liver Function Tests: Recent Labs  Lab 07/12/17 0400 07/13/17 0422 07/13/17 2138  ALBUMIN 2.3* 2.5* 2.9*   CBC: Recent Labs  Lab 07/08/17 1959 07/10/17 0321 07/10/17 0404  07/11/17 0303 07/12/17 0400 07/12/17 1100  WBC 10.8* 9.8 7.5  --  7.3 5.7  --   HGB 7.6* 8.3* 7.1*   < > 7.1* 6.9* 8.0*  HCT 23.2* 25.4* 21.8*   < > 21.4* 20.4* 23.9*  MCV 83.8 82.5 82.3  --  80.1 78.8  --   PLT 121* 164 139*  --  158 149*  --    < > = values in this interval not displayed.   Cardiac Enzymes: Recent Labs  Lab 07/10/17 0404  TROPONINI 0.32*   CBG: Recent Labs  Lab 07/12/17 1218 07/12/17 1553 07/13/17 0740 07/13/17 1112 07/13/17 1511  GLUCAP 125* 114* 109* 129* 157*   Medications: . sodium chloride Stopped (07/12/17 2000)  . sodium chloride     . arformoterol  15 mcg Nebulization BID  . atorvastatin  20 mg Oral  q1800  . bacitracin   Topical Daily  . budesonide (PULMICORT) nebulizer solution  0.5 mg Nebulization BID  . Chlorhexidine Gluconate Cloth  6 each Topical Daily  . clopidogrel  75 mg Oral Daily  . darbepoetin (ARANESP) injection - NON-DIALYSIS  150 mcg Subcutaneous Once  . diltiazem  120 mg Oral Daily  . docusate  100 mg Oral BID  . heparin  5,000 Units Subcutaneous Q8H  . multivitamin  1 tablet Oral QHS  . nicotine  14 mg Transdermal Daily  . pantoprazole  40 mg Oral Daily  . predniSONE  20 mg Oral Q breakfast    Dialysis Orders: MWF at [facility] 4h 73kg Hep 3000 400/800 2/2 bath  - Mircera 259mcg IV q 2 weeks (last 12/19) - Cinacalcet 120mg  TIW  Assessment/Plan: 1. LUE skin necrosis after AVF excision: S/p skin graft 07/04/17. VVS following. 2. Acute on Chronic respiratory failure: +RSV pneumonia. Completed IV abx, still on prednisone.S/p extubation 12/30/18improved with UF.  3. A fib with RVR: On diltiazem per cardiology. 4. ESRD: Req'd CVVHD 07/09/17 thru 07/12/17.Transitioned back to regular HD on 07/13/17, next 07/15/17. 5. Anemia:S/p 1U PRBCs on 07/12/17.Follow, re-dose  ESA with next HD. 6. CKD-MBD:Ca ok, Phos low. Binders/sensipar on hold for now. 7. Nutrition:Alb low, resuming protein supps. 8. Hep B: continue with contact precautions 9. CAD: per Cardiology   Veneta Penton, PA-C 07/14/2017, 9:43 AM  [facility] Pager: [phone]  Renal Attending: He looks remarkably well.  I agree with note above. Estanislado Emms

## 2017-07-14 NOTE — Progress Notes (Signed)
.. ..  Name: [name] MRN: [id number] DOB: 1961/02/27    ADMISSION DATE:  07/04/2017 CONSULTATION DATE:  07/08/2017  REFERRING MD :  Adele Barthel MD  CHIEF COMPLAINT:  Dyspnea and cough  BRIEF PATIENT DESCRIPTION:  57 yo male smoker presented with Lt arm wound.  He developed fever, respiratory distress and required intubation.  PMHx of Pulmonary HTN, mild COPD, ESRD, HTN, CAD s/p stent, mitral stenosis, GERD, Cocaine abuse, Hep B and C.  STUDIES: Echo 06/01/17 >> mod LVH, severe MS, PAS 95 mmHg CT angio chest/abd/pelvis 07/08/17 >> atherosclerosis, b/l nodular clusters concerning for PNA, CM  CULTURES: Blood 12/28 >>    ANTIBIOTICS: Zosyn 12/28 >> 1/1 Vancomycin 12/28 >> 12/29  LINES/TUBES: RT [place] HD catheter >>    EVENTS: Nov 2018 echo PASP 95 wit LVH 12/24 Admit, split thickness skin graft Lt arm  12/28 VDRF ETT 12/28 >> 12/30 Respiratory viral panel 12/29 >> RSV 12/30 WCT, cardiology consulted   event 07/13/17 - Now on RA, comfortable.    SUBJECTIVE/OVERNIGHT/INTERVAL HX 07/14/17 - pccm recalled. Per patietn: increased cough and increased white sputum compared to baseline. Poor hx cannot quantify more. Per RN: appeared visibly dyspneic this AM and wheezy -> alb given and empiric O2 2-3L: [place] and improed. Per patient he has copd and needs opd o2 and has a lebuaer pulm MD  VITAL SIGNS: BP (!) 183/84 (BP Location: Right Arm)   Pulse 81   Temp 98.6 F (37 C) (Oral)   Resp 18   Ht 5\' 9"  (1.753 m)   Wt 70.9 kg (156 lb 4.9 oz)   SpO2 100%   BMI 23.08 kg/m   INTAKE/OUTPUT: I/O last 3 completed shifts: In: 61 [P.O.:960; I.V.:10] Out: 4000 [Other:4000]  PHYSICAL EXAMINATION:  General Appearance:    Looks chronic unwell.No distress. Appears to have wet cough  Head:    Normocephalic, without obvious abnormality, atraumatic  Eyes:    PERRL - yes, conjunctiva/corneas - clear      Ears:    Normal external ear canals, both ears  Nose:   NG tube - no but  has 2L [place] o2  Throat:  ETT TUBE - no , OG tube - no  Neck:   Supple,  No enlargement/tenderness/nodules     Lungs:     Full sentences, .Wheezy.   Chest wall:    No deformity  Heart:    S1 and S2 normal, no murmur, CVP - no.  Pressors - no  Abdomen:     Soft, no masses, no organomegaly  Genitalia:    Not done  Rectal:   not done  Extremities:   Extremities- intact     Skin:   Intact in exposed areas but on Left thigh has graft     Neurologic:   Sedation - none -> RASS - +1 . Moves all 4s - yes. CAM-ICU - neg . Orientation - x3+      LABS: CBC Recent Labs    07/12/17 0400 07/12/17 1100  WBC 5.7  --   HGB 6.9* 8.0*  HCT 20.4* 23.9*  PLT 149*  --     Coag's Recent Labs    07/12/17 0400 07/13/17 0422 07/14/17 0944  APTT 44* 41* 47*    BMET Recent Labs    07/13/17 0422 07/13/17 2138 07/14/17 0944  NA 131* 133* 133*  K 3.6 3.0* 3.0*  CL 97* 95* 96*  CO2 24 27 28   BUN 15 5* 9  CREATININE 3.45*  2.19* 3.17*  GLUCOSE 113* 127* 118*    Electrolytes Recent Labs    07/12/17 0400 07/13/17 0422 07/13/17 2138 07/14/17 0944  CALCIUM 8.9 8.9 9.0 9.2  MG 2.4 2.4  --  2.1  PHOS 2.2* 2.1* 1.8* 2.3*    Sepsis Markers No results for input(s): PROCALCITON, O2SATVEN in the last 72 hours.  Invalid input(s): LACTICACIDVEN  ABG Recent Labs    07/12/17 0400  PHART 7.408  PCO2ART 43.6  PO2ART 64.2*    Liver Enzymes Recent Labs    07/13/17 0422 07/13/17 2138 07/14/17 0944  ALBUMIN 2.5* 2.9* 2.6*    Cardiac Enzymes No results for input(s): TROPONINI, PROBNP in the last 72 hours.  Glucose Recent Labs    07/12/17 0800 07/12/17 1218 07/12/17 1553 07/13/17 0740 07/13/17 1112 07/13/17 1511  GLUCAP 101* 125* 114* 109* 129* 157*    Imaging No results found. DISCUSSION: 57 yo male smoker, history ESRD, with acute hypoxic respiratory failure from AE-COPD, RSV pneumonitis, component volume overload  ASSESSMENT / PLAN:  Acute hypoxic respiratory  failure.due to AECOPD due to RSV +/- bacterial superinfection +/- volume overload  On 07/14/2017 -> recurrent wheezing and resp distress. uncler if true hypoxemia. ? Associated volume load. Associted with pred taper po . Suspect RSV AECOPD is needing time to resolve and needs longer pred taper  Plan  go back to IV steroids; will aim pred taper over 2 weeks Change duoneb from prn to q4h Dc brovana Continue pulmicort Wean o2 for pulse ox > 88% Continue mucinex Check CXr and if volume + -> dialize    {PCCM will follow   Dr. Brand Males, M.D., [facility].C.P Pulmonary and Critical Care Medicine Staff Physician, [place] Director - Interstitial Lung Disease  Program  Pulmonary [place] [facility] [place], [place], [address]  Pager: [phone], If no answer or between  15:00h - 7:00h: call [phone] Telephone: [phone]

## 2017-07-14 NOTE — Telephone Encounter (Signed)
-----   Message from Mena Goes, RN sent at 07/14/2017 10:45 AM EST ----- Regarding: appt next week with Dr. Oneida Alar only   ----- Message ----- From: Natividad Brood Sent: 07/14/2017  10:37 AM To: Vvs Charge Pool  Please schedule this pt to see Dr. Oneida Alar next week.  He is s/p skin graft and his arm will need to be looked at by Dr. Oneida Alar next week to determine further wound care.    Thanks. Aldona Bar

## 2017-07-14 NOTE — Discharge Instructions (Signed)
Daily dressing changes by home health nurse: Generous amount of bacitracin to left upper arm skin graft followed by Xeroform and then followed by 4x4's and gentle ace wrap to be done EVERY DAY by home health nurse.  No dressing changes to left leg donor site.

## 2017-07-14 NOTE — Progress Notes (Addendum)
  Progress Note    07/14/2017 8:53 AM 10 Days Post-Op  Subjective:  Says he feels good.  Afebrile HR 80's 546'F-681'E systolic 751% [id number]   Vitals:   07/13/17 1957 07/14/17 0457  BP: 110/64 125/68  Pulse: 84 80  Resp: 18 18  Temp: 98.2 F (36.8 C) 98.4 F (36.9 C)  SpO2: 100% 100%    Physical Exam: General:  Pt sitting comfortably on the side of the bed Lungs:  Breathing is non labored Extremities:  Left arm STSG looks good and is close to 100% take; left thigh donor site continues to dry out   CBC    Component Value Date/Time   WBC 5.7 07/12/2017 0400   RBC 2.59 (L) 07/12/2017 0400   HGB 8.0 (L) 07/12/2017 1100   HCT 23.9 (L) 07/12/2017 1100   PLT 149 (L) 07/12/2017 0400   MCV 78.8 07/12/2017 0400   MCH 26.6 07/12/2017 0400   MCHC 33.8 07/12/2017 0400   RDW 21.0 (H) 07/12/2017 0400   LYMPHSABS 1.5 06/19/2017 2030   MONOABS 0.8 06/19/2017 2030   EOSABS 0.0 06/19/2017 2030   BASOSABS 0.0 06/19/2017 2030    BMET    Component Value Date/Time   NA 133 (L) 07/13/2017 2138   K 3.0 (L) 07/13/2017 2138   CL 95 (L) 07/13/2017 2138   CO2 27 07/13/2017 2138   GLUCOSE 127 (H) 07/13/2017 2138   BUN 5 (L) 07/13/2017 2138   CREATININE 2.19 (H) 07/13/2017 2138   CREATININE 9.18 (H) 02/15/2017 1425   CALCIUM 9.0 07/13/2017 2138   CALCIUM 8.1 (L) 07/11/2011 1117   GFRNONAA 32 (L) 07/13/2017 2138   GFRNONAA 6 (L) 02/15/2017 1425   GFRAA 37 (L) 07/13/2017 2138   GFRAA 7 (L) 02/15/2017 1425    INR    Component Value Date/Time   INR 1.20 06/14/2017 0936     Intake/Output Summary (Last 24 hours) at 07/14/2017 0853 Last data filed at 07/14/2017 0457 Gross per 24 hour  Intake 360 ml  Output 4000 ml  Net -3640 ml     Assessment:  57 y.o. male is s/p:  Split thickness skin graft  10 Days Post-Op  Plan: -pt's wound left arm inspected by Dr. Donzetta Matters and myself and looks great.  Will need HH for dressing changes.  Pt states that he will be discharging home to his  sister's house and that she can assist with dressing changes.  This must be definite for pt to discharge.   -f/u with Dr. Oneida Alar next Thursday. -DVT prophylaxis:  SQ heparin   Leontine Locket, PA-C [facility] [phone] 07/14/2017 8:53 AM   I have interviewed and examined patient with PA and agree with assessment and plan above.   Brandon C. Donzetta Matters, MD [facility] [place] Office: [phone] Pager: [phone]

## 2017-07-14 NOTE — Telephone Encounter (Signed)
Sched appt 07/21/17 at 2:30. Spoke to pt.

## 2017-07-14 NOTE — Progress Notes (Signed)
Nutrition Follow-up  DOCUMENTATION CODES:   Not applicable  INTERVENTION:   Recommend liberalizing diet to promote po intake  Nepro Shake po BID, each supplement provides 425 kcal and 19 grams protein  Pro-Stat 30 mL BID  Snacks between meals  NUTRITION DIAGNOSIS:   Inadequate oral intake related to inability to eat as evidenced by NPO status.  Being addressed as diet advanced, pt taking some po, supplements  GOAL:   Patient will meet greater than or equal to 90% of their needs  MONITOR:   PO intake, Supplement acceptance, Labs, Weight trends  REASON FOR ASSESSMENT:   Consult Enteral/tube feeding initiation and management  ASSESSMENT:   57 yr old male with a PMHx of COPD, CAD, substance abuse (documented cocaine). HTN Cardiomegaly, ESRD on HD (vie RIJ catheter), Hep B&C, and Tubular adenoma of the colon presented on 12/24 for wound defect of the left arm secondary to skin necrosis and is s/p skin graft.  Extubated 12/30 CVVHD 12/29 until 1/1 IHD 1/2 POD 10 Split thickness skin graft  Pt reports appetite is fairly good; likes Nepro snacks and is a "snacker" and would like snacks between meals. Recorded po intake 10-100%.   Current wt of 70.9 kg is below EDW of 75 kg  Phosphorus low, noted binders/sensipar on hold  Labs: sodium 133, potassium 3.0 (L), phosphorus 2.3 (L) Meds: Rena-Vit, solumedrol  Diet Order:  Diet renal with fluid restriction Fluid restriction: 1200 mL Fluid; Room service appropriate? Yes; Fluid consistency: Thin  EDUCATION NEEDS:   Not appropriate for education at this time  Skin:  Skin Assessment: (Incisions arm and thigh )  Last BM:  12/25  Height:   Ht Readings from Last 1 Encounters:  07/11/17 5\' 9"  (1.753 m)    Weight:   Wt Readings from Last 1 Encounters:  07/13/17 156 lb 4.9 oz (70.9 kg)    Ideal Body Weight:  72.7 kg  BMI:  Body mass index is 23.08 kg/m.  Estimated Nutritional Needs:   Kcal:  5638-7564  kcals  Protein:  105-123 g   Fluid:  1000 mL plus UOP  [employer] MS, RD, LDN, CNSC [phone] Pager  [phone] Weekend/On-Call Pager

## 2017-07-15 ENCOUNTER — Encounter (HOSPITAL_COMMUNITY)
Admission: RE | Discharge status: Against Medical Advice | Payer: Self-pay | Source: Ambulatory Visit | Attending: Vascular Surgery

## 2017-07-15 ENCOUNTER — Encounter (HOSPITAL_COMMUNITY): Payer: Self-pay | Admitting: Radiology

## 2017-07-15 ENCOUNTER — Telehealth: Payer: Self-pay | Admitting: Internal Medicine

## 2017-07-15 ENCOUNTER — Inpatient Hospital Stay (HOSPITAL_COMMUNITY): Payer: Medicare Other

## 2017-07-15 DIAGNOSIS — Z7901 Long term (current) use of anticoagulants: Secondary | ICD-10-CM

## 2017-07-15 DIAGNOSIS — F1410 Cocaine abuse, uncomplicated: Secondary | ICD-10-CM

## 2017-07-15 DIAGNOSIS — K625 Hemorrhage of anus and rectum: Secondary | ICD-10-CM

## 2017-07-15 DIAGNOSIS — Z992 Dependence on renal dialysis: Secondary | ICD-10-CM

## 2017-07-15 DIAGNOSIS — R1030 Lower abdominal pain, unspecified: Secondary | ICD-10-CM

## 2017-07-15 DIAGNOSIS — J42 Unspecified chronic bronchitis: Secondary | ICD-10-CM

## 2017-07-15 DIAGNOSIS — D631 Anemia in chronic kidney disease: Secondary | ICD-10-CM

## 2017-07-15 DIAGNOSIS — K921 Melena: Secondary | ICD-10-CM | POA: Diagnosis present

## 2017-07-15 HISTORY — PX: FLEXIBLE SIGMOIDOSCOPY: SHX5431

## 2017-07-15 LAB — HEPATIC FUNCTION PANEL
ALT: 22 U/L (ref 17–63)
AST: 41 U/L (ref 15–41)
Albumin: 2.5 g/dL — ABNORMAL LOW (ref 3.5–5.0)
Alkaline Phosphatase: 95 U/L (ref 38–126)
Bilirubin, Direct: 0.2 mg/dL (ref 0.1–0.5)
Indirect Bilirubin: 0.6 mg/dL (ref 0.3–0.9)
Total Bilirubin: 0.8 mg/dL (ref 0.3–1.2)
Total Protein: 6.1 g/dL — ABNORMAL LOW (ref 6.5–8.1)

## 2017-07-15 LAB — CBC WITH DIFFERENTIAL/PLATELET
Basophils Absolute: 0 10*3/uL (ref 0.0–0.1)
Basophils Relative: 0 %
Eosinophils Absolute: 0 10*3/uL (ref 0.0–0.7)
Eosinophils Relative: 0 %
HCT: 28.6 % — ABNORMAL LOW (ref 39.0–52.0)
Hemoglobin: 9.3 g/dL — ABNORMAL LOW (ref 13.0–17.0)
Lymphocytes Relative: 17 %
Lymphs Abs: 1.3 10*3/uL (ref 0.7–4.0)
MCH: 25.3 pg — ABNORMAL LOW (ref 26.0–34.0)
MCHC: 32.5 g/dL (ref 30.0–36.0)
MCV: 77.9 fL — ABNORMAL LOW (ref 78.0–100.0)
Monocytes Absolute: 0.2 10*3/uL (ref 0.1–1.0)
Monocytes Relative: 3 %
Neutro Abs: 5.9 10*3/uL (ref 1.7–7.7)
Neutrophils Relative %: 80 %
Platelets: 216 10*3/uL (ref 150–400)
RBC: 3.67 MIL/uL — ABNORMAL LOW (ref 4.22–5.81)
RDW: 19.9 % — ABNORMAL HIGH (ref 11.5–15.5)
WBC: 7.4 10*3/uL (ref 4.0–10.5)

## 2017-07-15 LAB — LACTIC ACID, PLASMA
Lactic Acid, Venous: 2.1 mmol/L (ref 0.5–1.9)
Lactic Acid, Venous: 3.5 mmol/L (ref 0.5–1.9)

## 2017-07-15 LAB — RENAL FUNCTION PANEL
Albumin: 2.8 g/dL — ABNORMAL LOW (ref 3.5–5.0)
Anion gap: 14 (ref 5–15)
BUN: 19 mg/dL (ref 6–20)
CO2: 24 mmol/L (ref 22–32)
Calcium: 9.4 mg/dL (ref 8.9–10.3)
Chloride: 93 mmol/L — ABNORMAL LOW (ref 101–111)
Creatinine, Ser: 4.06 mg/dL — ABNORMAL HIGH (ref 0.61–1.24)
GFR calc Af Amer: 18 mL/min — ABNORMAL LOW (ref >60)
GFR calc non Af Amer: 15 mL/min — ABNORMAL LOW (ref >60)
Glucose, Bld: 183 mg/dL — ABNORMAL HIGH (ref 65–99)
Phosphorus: 2.5 mg/dL (ref 2.5–4.6)
Potassium: 3.4 mmol/L — ABNORMAL LOW (ref 3.5–5.1)
Sodium: 131 mmol/L — ABNORMAL LOW (ref 135–145)

## 2017-07-15 LAB — C DIFFICILE QUICK SCREEN W PCR REFLEX
C Diff antigen: NEGATIVE
C Diff interpretation: NOT DETECTED
C Diff toxin: NEGATIVE

## 2017-07-15 LAB — CBC
HCT: 21 % — ABNORMAL LOW (ref 39.0–52.0)
Hemoglobin: 7.1 g/dL — ABNORMAL LOW (ref 13.0–17.0)
MCH: 27 pg (ref 26.0–34.0)
MCHC: 33.8 g/dL (ref 30.0–36.0)
MCV: 79.8 fL (ref 78.0–100.0)
Platelets: 269 10*3/uL (ref 150–400)
RBC: 2.63 MIL/uL — ABNORMAL LOW (ref 4.22–5.81)
RDW: 20.9 % — ABNORMAL HIGH (ref 11.5–15.5)
WBC: 14.8 10*3/uL — ABNORMAL HIGH (ref 4.0–10.5)

## 2017-07-15 LAB — HEMOGLOBIN AND HEMATOCRIT, BLOOD
HCT: 16 % — ABNORMAL LOW (ref 39.0–52.0)
Hemoglobin: 5.5 g/dL — CL (ref 13.0–17.0)

## 2017-07-15 LAB — APTT: aPTT: 42 seconds — ABNORMAL HIGH (ref 24–36)

## 2017-07-15 LAB — MAGNESIUM: Magnesium: 2.2 mg/dL (ref 1.7–2.4)

## 2017-07-15 SURGERY — SIGMOIDOSCOPY, FLEXIBLE
Anesthesia: Moderate Sedation

## 2017-07-15 MED ORDER — PREDNISONE 50 MG PO TABS
50.0000 mg | ORAL_TABLET | Freq: Every day | ORAL | Status: DC
  Administered 2017-07-15: 50 mg via ORAL
  Filled 2017-07-15 (×2): qty 1

## 2017-07-15 MED ORDER — MIDAZOLAM HCL 5 MG/ML IJ SOLN
INTRAMUSCULAR | Status: AC
  Filled 2017-07-15: qty 2

## 2017-07-15 MED ORDER — TIOTROPIUM BROMIDE MONOHYDRATE 18 MCG IN CAPS: 18.0000 ug | ORAL_CAPSULE | Freq: Every day | RESPIRATORY_TRACT | 12 refills | Status: DC

## 2017-07-15 MED ORDER — SODIUM CHLORIDE 0.9 % IV SOLN: Freq: Once | INTRAVENOUS | Status: DC

## 2017-07-15 MED ORDER — TIOTROPIUM BROMIDE MONOHYDRATE 18 MCG IN CAPS
18.0000 ug | ORAL_CAPSULE | Freq: Every day | RESPIRATORY_TRACT | Status: DC
  Administered 2017-07-15 – 2017-07-18 (×4): 18 ug via RESPIRATORY_TRACT
  Filled 2017-07-15: qty 5

## 2017-07-15 MED ORDER — MOMETASONE FURO-FORMOTEROL FUM 200-5 MCG/ACT IN AERO
2.0000 | INHALATION_SPRAY | Freq: Two times a day (BID) | RESPIRATORY_TRACT | Status: DC
  Administered 2017-07-15 – 2017-07-18 (×6): 2 via RESPIRATORY_TRACT
  Filled 2017-07-15: qty 8.8

## 2017-07-15 MED ORDER — PREDNISONE 10 MG PO TABS: ORAL_TABLET | ORAL | 0 refills | Status: AC

## 2017-07-15 MED ORDER — FENTANYL CITRATE (PF) 100 MCG/2ML IJ SOLN
INTRAMUSCULAR | Status: AC
  Filled 2017-07-15: qty 2

## 2017-07-15 MED ORDER — SODIUM CHLORIDE 0.9 % IV SOLN: INTRAVENOUS | Status: DC

## 2017-07-15 MED ORDER — ALBUMIN HUMAN 25 % IV SOLN
25.0000 g | Freq: Once | INTRAVENOUS | Status: AC
  Administered 2017-07-16: 25 g via INTRAVENOUS
  Filled 2017-07-15: qty 50
  Filled 2017-07-15: qty 100

## 2017-07-15 MED ORDER — IOPAMIDOL (ISOVUE-370) INJECTION 76%
INTRAVENOUS | Status: AC
Start: 2017-07-15 — End: 2017-07-15
  Administered 2017-07-15: 100 mL
  Filled 2017-07-15: qty 100

## 2017-07-15 MED ORDER — PANTOPRAZOLE SODIUM 40 MG IV SOLR
40.0000 mg | Freq: Two times a day (BID) | INTRAVENOUS | Status: DC
  Administered 2017-07-16 (×2): 40 mg via INTRAVENOUS
  Filled 2017-07-15 (×4): qty 40

## 2017-07-15 MED ORDER — ALBUTEROL SULFATE (2.5 MG/3ML) 0.083% IN NEBU: 2.5000 mg | INHALATION_SOLUTION | RESPIRATORY_TRACT | Status: DC | PRN

## 2017-07-15 MED ORDER — HYDROMORPHONE HCL 1 MG/ML IJ SOLN
1.0000 mg | INTRAMUSCULAR | Status: DC | PRN
  Administered 2017-07-15 – 2017-07-18 (×8): 1 mg via INTRAVENOUS
  Filled 2017-07-15 (×8): qty 1

## 2017-07-15 MED ORDER — ALBUTEROL SULFATE (2.5 MG/3ML) 0.083% IN NEBU: 2.5000 mg | INHALATION_SOLUTION | RESPIRATORY_TRACT | 12 refills | Status: DC | PRN

## 2017-07-15 NOTE — Telephone Encounter (Signed)
Pt currently admitted.

## 2017-07-15 NOTE — Progress Notes (Signed)
Vascular service was notified patient had BRBPR. Patient was examined.  Abd soft, external hemorrhoids noted however no active bleeding on exam I was then informed patient had a second bloody BM. GI was consulted and has agreed to evaluate.

## 2017-07-15 NOTE — Progress Notes (Signed)
Pt refused to got to HD until he is seen by GI, reported 4x BRBPR. Vascular PA aware.

## 2017-07-15 NOTE — Progress Notes (Signed)
See progress note by Dr. Donzetta Matters today  L arm dressing changed  Skin graft still taking nicely Possible d/c after dialysis if ok with nephrology Steroid taper per Pulmonary recommendations

## 2017-07-15 NOTE — Progress Notes (Signed)
.. ..  Name: [name] MRN: [id number] DOB: [birth year]    ADMISSION DATE:  07/04/2017 CONSULTATION DATE:  07/08/2017  REFERRING MD :  Adele Barthel MD  CHIEF COMPLAINT:  Dyspnea and cough  BRIEF PATIENT DESCRIPTION:  57 yo male smoker presented with Lt arm wound.  He developed fever, respiratory distress and required intubation.  PMHx of Pulmonary HTN, mild COPD, ESRD, HTN, CAD s/p stent, mitral stenosis, GERD, Cocaine abuse, Hep B and C.  STUDIES: Echo 06/01/17 >> mod LVH, severe MS, PAS 95 mmHg CT angio chest/abd/pelvis 07/08/17 >> atherosclerosis, b/l nodular clusters concerning for PNA, CM  CULTURES: Blood 12/28 >>    ANTIBIOTICS: Zosyn 12/28 >> 1/1 Vancomycin 12/28 >> 12/29  LINES/TUBES: RT [place] HD catheter >>    EVENTS: Nov 2018 echo PASP 95 wit LVH 12/24 Admit, split thickness skin graft Lt arm  12/28 VDRF ETT 12/28 >> 12/30 Respiratory viral panel 12/29 >> RSV 12/30 WCT, cardiology consulted   event 07/13/17 - Now on RA, comfortable.    07/14/17 - pccm recalled. Per patietn: increased cough and increased white sputum compared to baseline. Poor hx cannot quantify more. Per RN: appeared visibly dyspneic this AM and wheezy -> alb given and empiric O2 2-3L: [place] and improed. Per  patient he has copd and needs opd o2 and has a lebuaer pulm MD    SUBJECTIVE/OVERNIGHT/INTERVAL HX 07/15/17 - better, wants to go home. Reduced cough, dyspnea, sputum and wheeze. Due to HD 07/15/2017 - MWF HD normally   VITAL SIGNS: BP (!) 174/77 (BP Location: Right Arm)   Pulse 98   Temp 98.5 F (36.9 C) (Oral)   Resp 20   Ht 5\' 9"  (1.753 m)   Wt 70.6 kg (155 lb 10.3 oz)   SpO2 98%   BMI 22.98 kg/m   INTAKE/OUTPUT: I/O last 3 completed shifts: In: 480 [P.O.:480] Out: 4000 [Other:4000]  PHYSICAL EXAMINATION:   General Appearance:    Looks better but chronic unwell  Head:    Normocephalic, without obvious abnormality, atraumatic  Eyes:    PERRL - yes,  conjunctiva/corneas - clea5      Ears:    Normal external ear canals, both ears  Nose:   NG tube - no but has [place]  Throat:  ETT TUBE - no , OG tube - no  Neck:   Supple,  No enlargement/tenderness/nodules     Lungs:     Crackles and junky'; no distress. Some better than yesterda  Chest wall:    No deformity  Heart:    S1 and S2 normal, no murmur, CVP - no.  Pressors - no  Abdomen:     Soft, no masses, no organomegaly  Genitalia:    Not done  Rectal:   not done  Extremities:   Extremities- intact     Skin:   Intact in exposed areas .      Neurologic:   Sedation - none -> RASS - +1 . Moves all 4s - yes. CAM-ICU - neg . Orientation - x3+       LABS: CBC Recent Labs    07/12/17 1100 07/15/17 0035  WBC  --  7.4  HGB 8.0* 9.3*  HCT 23.9* 28.6*  PLT  --  216    Coag's Recent Labs    07/13/17 0422 07/14/17 0944 07/15/17 0035  APTT 41* 47* 42*    BMET Recent Labs    07/13/17 2138 07/14/17 0944 07/15/17 0035  NA 133* 133* 131*  K 3.0* 3.0* 3.4*  CL 95* 96* 93*  CO2 27 28 24   BUN 5* 9 19  CREATININE 2.19* 3.17* 4.06*  GLUCOSE 127* 118* 183*    Electrolytes Recent Labs    07/13/17 0422 07/13/17 2138 07/14/17 0944 07/15/17 0035  CALCIUM 8.9 9.0 9.2 9.4  MG 2.4  --  2.1 2.2  PHOS 2.1* 1.8* 2.3* 2.5    Sepsis Markers No results for input(s): PROCALCITON, O2SATVEN in the last 72 hours.  Invalid input(s): LACTICACIDVEN  ABG No results for input(s): PHART, PCO2ART, PO2ART in the last 72 hours.  Liver Enzymes Recent Labs    07/13/17 2138 07/14/17 0944 07/15/17 0035  ALBUMIN 2.9* 2.6* 2.8*    Cardiac Enzymes No results for input(s): TROPONINI, PROBNP in the last 72 hours.  Glucose Recent Labs    07/12/17 1218 07/12/17 1553 07/13/17 0740 07/13/17 1112 07/13/17 1511  GLUCAP 125* 114* 109* 129* 157*    Imaging Dg Chest Port 1 View  Result Date: 07/14/2017 CLINICAL DATA:  RSV infection. Cough, shortness of breath. History of COPD,  end-stage renal disease, smoker EXAM: PORTABLE CHEST 1 VIEW COMPARISON:  Chest x-ray of July 12, 2017 FINDINGS: The lungs are adequately inflated. There is persistent increased density in the retrocardiac region on the left. The cardiac silhouette is enlarged. The central pulmonary vascularity is mildly prominent. There is mild overall interstitial prominence which is stable. There is calcification in the wall of the aortic arch. The dialysis catheter tip projects at the cavoatrial junction. IMPRESSION: COPD. Persistent left lower lobe atelectasis or pneumonia. Stable cardiomegaly with minimal pulmonary vascular congestion. Electronically Signed   By: David  Martinique M.D.   On: 07/14/2017 14:56   To me CXr looks clearer  DISCUSSION: 57 yo male smoker, history ESRD, with acute hypoxic respiratory failure from AE-COPD, RSV pneumonitis, component volume overload  ASSESSMENT / PLAN:  Acute hypoxic respiratory failure.due to AECOPD due to RSV +/- bacterial superinfection +/- volume overload  On 07/15/2017 -> improved some what with bd and IV steroids reintroduction. Crackles suggest volume loverload.    Plan Dc IV steroids 07/15/2017 - dc home on pred taper as follows but will get pred 50mg  07/15/2017 here then from 07/16/17  - Please take Take prednisone 40mg  once daily x 3 days, then 30mg  once daily x 3 days, then 20mg  once daily x 3 days, then prednisone 10mg  once daily  x 3 days and stop  Start spiriva - take home Start home symbicort (dulera in the hospital formulary) - tak ehome Start albuterol hfa prn - take home Dc brovana dc pulmicort Wean o2 for pulse ox > 88% - and try walking him for home o2 need - DME order done to evaluate Continue mucinex Dialyse 07/15/2017 before dc Needs opd pft  Future Appointments  Date Time Provider [place]  07/21/2017  2:30 PM Elam Dutch, MD [facility]  07/26/2017  2:15 PM Parrett, Fonnie Mu, NP [facility] None  07/28/2017  1:00 PM Fields,  Jessy Oto, MD [facility]      PCCM will sign off   Dr. Brand Males, M.D., [facility].C.P Pulmonary and Critical Care Medicine Staff Physician, [place] Director - Interstitial Lung Disease  Program  Pulmonary [place] at [place], [place], [address]  Pager: [phone], If no answer or between  15:00h - 7:00h: call [phone] Telephone: [phone]

## 2017-07-15 NOTE — Consult Note (Signed)
Referring Provider: Vascular Surgery - Dr. Donzetta Matters Primary Care Physician:  [name], MD Primary Gastroenterologist:  Zenovia Jarred, MD  Reason for Consultation:  Rectal bleeding  ASSESSMENT AND PLAN:    21. 57 yo male with acute painless hematochezia on plavix and also SQ heparin Q8 hours this admission.  He has no known hx of diverticulosis but still could be diverticular hemorrhage. Ischemic colitis also possible but usually associated with pain and he adamantly denied any present or recent abdominal pain. Hemorrhoidal bleeding seems unlikely given amount and frequency of episodes. He passed a large amount of bright red blood while I was in the room just now. -Last set of vitals was around 9am today. He needs vitals done Q 4 at a minimum while actively bleeding. Recommend transferring him to another floor for closer monitoring -He needs to be up with assistance only and if symptomatic then bedrest. at least Q 4 vitals [facility] to assist with management of multiple medical problems.  -Will stop Plavix and SQ heparin  -change to clear liquid diet -last CBC was at midnight, I have ordered stat repeat followed by Q6 hours -transfuse as needed -NOW complaining of sudden onset of lower abdominal discomfort. Will obtain CT angio or abdomen now.   2. Hx of HCV, treated. Also hx of chronic active HBV. Advanced fibrosis on elastrography. No evidence for portal HTN on EGD in May. ? Scleral icterus on exam. -LFTs ordered  3. COPD. PNA and respiratory distress requiring intubation this admission.   4. Chronic microcytic anemia, likely multifactorial with CKD.    HPI: [name] is a 57 y.o. male with multiple medical problems not limited to CAD / hx of stents, COPD, ESRD on HD M/W/F, AFIB with RVR, cardiomyopathy, and cocaine abuse.  He also has a hx of HCV, treated last year by ID. He has a hx of chronic active hepatitis B as well. Patient underwent hemorrhoidal banding  in 2015. He is due for colonoscopy soon for hx of adenomatous colon polyps in 2014.   Patient has been in hospital several days for left arm wound resulting from dialysis graft. He is s/p skin graft. Hospital course has been complicated by PNA / respiratory distress requiring intubation. Patient was for discharge today when vascular received call from nurse that patient was having blood in stools. Patient is on chronic plavix. He has occasional hemorrhoid bleeding associated with constipation but has never passed this amount of blood. He describes 9-10 episodes of large volume rectal bleeding today. No associated abdominal pain. No nausea or vomiting.   Endoscopic history:  May 2018 - EGD for varices screening (hx of HCV and F3-F4 fibrosis on u/s elastrography) - no varices of portal gastropathy found. There was a 1 cm hiatal hernia and acute gastritis.    Past Medical History:  Diagnosis Date  . Adenomatous colon polyp    tubular  . Anemia   . Anxiety   . Arthritis    left shoulder  . Atherosclerosis of aorta ([place])   . Cardiomegaly   . Chest pain    DATE UNKNOWN, C/O PERIODICALLY  . Cocaine abuse ([place])   . COPD exacerbation ([place]) 08/17/2016  . Coronary artery disease    stent 02/22/17  . Dialysis patient ([place])    Monday-Wednesday-Friday  . ESRD (end stage renal disease) on dialysis ([place])    "[name]; MWF" (07/04/2017)  . GERD (gastroesophageal reflux disease)    DATE UNKNOWN  . Hemorrhoids   .  Hepatitis B, chronic ([place])   . Hepatitis C   . History of kidney stones   . Hyperkalemia   . Hypertension   . Metabolic bone disease    Patient denies  . Myocardial infarction ([place])   . Pneumonia   . Pulmonary edema   . Renal disorder   . Renal insufficiency   . Shortness of breath dyspnea    " for the last past year with this dialysis"  . Tubular adenoma of colon     Past Surgical History:  Procedure Laterality Date  . A/V FISTULAGRAM Left 05/26/2017   Procedure: A/V  FISTULAGRAM;  Surgeon: Conrad Seaford, MD;  Location: [place] [facility];  Service: Cardiovascular;  Laterality: Left;  . APPLICATION OF WOUND VAC Left 06/14/2017   Procedure: APPLICATION OF WOUND VAC;  Surgeon: Katha Cabal, MD;  Location: [facility];  Service: Vascular;  Laterality: Left;  . AV FISTULA PLACEMENT  2012   BELIEVED WAS PLACED IN JUNE  . COLONOSCOPY    . CORONARY STENT INTERVENTION N/A 02/22/2017   Procedure: CORONARY STENT INTERVENTION;  Surgeon: Nigel Mormon, MD;  Location: [place] [facility];  Service: Cardiovascular;  Laterality: N/A;  . HEMORRHOID BANDING    . I&D EXTREMITY Left 06/01/2017   Procedure: IRRIGATION AND DEBRIDEMENT LEFT ARM HEMATOMA WITH LIGATION OF LEFT ARM AV FISTULA;  Surgeon: Elam Dutch, MD;  Location: [place];  Service: Vascular;  Laterality: Left;  . I&D EXTREMITY Left 06/14/2017   Procedure: IRRIGATION AND DEBRIDEMENT EXTREMITY;  Surgeon: Katha Cabal, MD;  Location: [facility];  Service: Vascular;  Laterality: Left;  . INSERTION OF DIALYSIS CATHETER  05/30/2017  . INSERTION OF DIALYSIS CATHETER N/A 05/30/2017   Procedure: INSERTION OF DIALYSIS CATHETER;  Surgeon: Elam Dutch, MD;  Location: [facility];  Service: Vascular;  Laterality: N/A;  . LEFT HEART CATH AND CORONARY ANGIOGRAPHY N/A 02/22/2017   Procedure: LEFT HEART CATH AND CORONARY ANGIOGRAPHY;  Surgeon: Nigel Mormon, MD;  Location: [place] [facility];  Service: Cardiovascular;  Laterality: N/A;  . LIGATION OF ARTERIOVENOUS  FISTULA Left 11/15/4330   Procedure: Plication of Left Arm Arteriovenous Fistula;  Surgeon: Elam Dutch, MD;  Location: [place];  Service: Vascular;  Laterality: Left;  . POLYPECTOMY    . REVISON OF ARTERIOVENOUS FISTULA Left 9/51/8841   Procedure: PLICATION OF DISTAL ANEURYSMAL SEGEMENT OF LEFT UPPER ARM ARTERIOVENOUS FISTULA;  Surgeon: Elam Dutch, MD;  Location: [place];  Service: Vascular;  Laterality: Left;  . REVISON OF ARTERIOVENOUS  FISTULA Left 6/60/6301   Procedure: Plication of Left Upper Arm Fistula ;  Surgeon: Waynetta Sandy, MD;  Location: [place];  Service: Vascular;  Laterality: Left;  . SKIN GRAFT SPLIT THICKNESS LEG / FOOT Left    SKIN GRAFT SPLIT THICKNESS LEFT ARM DONOR SITE: LEFT ANTERIOR THIGH  . SKIN SPLIT GRAFT Left 07/04/2017   Procedure: SKIN GRAFT SPLIT THICKNESS LEFT ARM DONOR SITE: LEFT ANTERIOR THIGH;  Surgeon: Elam Dutch, MD;  Location: [place];  Service: Vascular;  Laterality: Left;  . THROMBECTOMY W/ EMBOLECTOMY Left 06/05/2017   Procedure: EXPLORATION OF LEFT ARM FOR BLEEDING; OVERSEWED PROXIMAL FISTULA;  Surgeon: Angelia Mould, MD;  Location: [place];  Service: Vascular;  Laterality: Left;  . WOUND EXPLORATION Left 06/03/2017   Procedure: WOUND EXPLORATION WITH WOUND VAC APPLICATION TO LEFT ARM;  Surgeon: Angelia Mould, MD;  Location: [place];  Service: Vascular;  Laterality: Left;  Prior to Admission medications   Medication Sig Start Date End Date Taking? Authorizing Provider  acetaminophen (TYLENOL) 325 MG tablet Take 2 tablets (650 mg total) by mouth every 6 (six) hours as needed for mild pain (or Fever >/= 101). 06/23/17  Yes Shahmehdi, Seyed A, MD  budesonide-formoterol (SYMBICORT) 160-4.5 MCG/ACT inhaler Inhale 2 puffs into the lungs every 12 (twelve) hours.   Yes [provider]  calcitRIOL (ROCALTROL) 0.25 MCG capsule Take 3 capsules (0.75 mcg total) by mouth every Monday, Wednesday, and Friday with hemodialysis. 03/28/17  Yes Cherene Altes, MD  diltiazem (CARDIZEM CD) 120 MG 24 hr capsule Take 1 capsule (120 mg total) by mouth daily. 06/23/17 06/23/18 Yes Shahmehdi, Seyed A, MD  gabapentin (NEURONTIN) 100 MG capsule Take 100 mg 2 (two) times daily by mouth.   Yes [provider]  isosorbide mononitrate (IMDUR) 30 MG 24 hr tablet Take 1 tablet (30 mg total) by mouth daily. 06/17/17  Yes [name], MD  Melatonin 5 MG TABS Take 5 mg by mouth  at bedtime as needed (sleep).   Yes [provider]  minoxidil (LONITEN) 2.5 MG tablet Take 1 tablet (2.5 mg total) by mouth 2 (two) times daily. 06/23/17 07/23/17 Yes Shahmehdi, Seyed A, MD  nicotine (NICODERM CQ - DOSED IN MG/24 HOURS) 14 mg/24hr patch Place 14 mg daily onto the skin. 03/30/17  Yes [provider]  sevelamer carbonate (RENVELA) 800 MG tablet Take 4 tablets (3,200 mg total) by mouth 3 (three) times daily with meals. 06/10/17  Yes Mariel Aloe, MD  zolpidem (AMBIEN) 10 MG tablet Take 1 tablet (10 mg total) by mouth at bedtime as needed for sleep. 06/10/17  Yes Mariel Aloe, MD  clopidogrel (PLAVIX) 75 MG tablet Take 1 tablet (75 mg total) by mouth daily. 07/15/17   Rhyne, Hulen Shouts, PA-C  multivitamin (RENA-VIT) TABS tablet Take 1 tablet by mouth at bedtime. 06/17/17   [name], MD  oxyCODONE (ROXICODONE) 5 MG immediate release tablet Take 1 tablet (5 mg total) by mouth every 8 (eight) hours as needed. 07/07/17 07/07/18  Dagoberto Ligas, PA-C  predniSONE (DELTASONE) 20 MG tablet Take 1 tablet (20 mg total) by mouth daily with breakfast for 3 days. 07/16/17 07/19/17  Rhyne, Hulen Shouts, PA-C  senna (SENOKOT) 8.6 MG TABS tablet Take 1 tablet (8.6 mg total) by mouth daily as needed for mild constipation. 06/10/17   Mariel Aloe, MD  Spacer/Aero-Holding Chambers (AEROCHAMBER MV) inhaler Use as instructed 03/10/17   Javier Glazier, MD  vitamin C (VITAMIN C) 500 MG tablet Take 1 tablet (500 mg total) by mouth 2 (two) times daily. 06/17/17   [name], MD    Current Facility-Administered Medications  Medication Dose Route Frequency Provider Last Rate Last Dose  . 0.9 %  sodium chloride infusion   Intravenous Continuous Collene Gobble, MD   Stopped at 07/12/17 2000  . albuterol (PROVENTIL) (2.5 MG/3ML) 0.083% nebulizer solution 2.5 mg  2.5 mg Inhalation Q4H PRN Brand Males, MD      . alteplase (CATHFLO ACTIVASE) injection 2 mg  2 mg Intracatheter Once PRN  Roney Jaffe, MD      . atorvastatin (LIPITOR) tablet 20 mg  20 mg Oral q1800 Elam Dutch, MD   20 mg at 07/14/17 1732  . bacitracin ointment   Topical Daily Rhyne, Hulen Shouts, PA-C      . Chlorhexidine Gluconate Cloth 2 % PADS 6 each  6 each Topical Daily Fields,  Jessy Oto, MD   6 each at 07/15/17 [phone]  . clopidogrel (PLAVIX) tablet 75 mg  75 mg Oral Daily Patwardhan, Manish J, MD   75 mg at 07/15/17 0953  . Darbepoetin Alfa (ARANESP) injection 200 mcg  200 mcg Intravenous Q Fri-HD Stovall, Kathryn R, PA-C      . diltiazem (CARDIZEM CD) 24 hr capsule 120 mg  120 mg Oral Daily Patwardhan, Manish J, MD   120 mg at 07/15/17 0953  . docusate (COLACE) 50 MG/5ML liquid 100 mg  100 mg Oral BID Einar Grad, RPH   100 mg at 07/15/17 0175  . feeding supplement (NEPRO CARB STEADY) liquid 237 mL  237 mL Oral BID BM Loren Racer, PA-C   237 mL at 07/15/17 0958  . feeding supplement (PRO-STAT SUGAR FREE 64) liquid 30 mL  30 mL Oral BID Loren Racer, PA-C   30 mL at 07/15/17 0953  . guaiFENesin (ROBITUSSIN) 100 MG/5ML solution 100 mg  5 mL Oral TID PRN Rigoberto Noel, MD   100 mg at 07/15/17 0953  . heparin injection 5,000 Units  5,000 Units Subcutaneous Q8H Elam Dutch, MD   5,000 Units at 07/15/17 [phone]  . labetalol (NORMODYNE,TRANDATE) injection 10 mg  10 mg Intravenous Q10 min PRN Elam Dutch, MD   10 mg at 07/13/17 1333  . mometasone-formoterol (DULERA) 200-5 MCG/ACT inhaler 2 puff  2 puff Inhalation BID Brand Males, MD   2 puff at 07/15/17 0940  . multivitamin (RENA-VIT) tablet 1 tablet  1 tablet Oral QHS Elam Dutch, MD   1 tablet at 07/14/17 2105  . nicotine (NICODERM CQ - dosed in mg/24 hours) patch 14 mg  14 mg Transdermal Daily Elam Dutch, MD   14 mg at 07/15/17 0953  . ondansetron (ZOFRAN) injection 4 mg  4 mg Intravenous Q6H PRN Elam Dutch, MD   4 mg at 07/11/17 2230  . pantoprazole (PROTONIX) EC tablet 40 mg  40 mg Oral Daily Kris Mouton, RPH   40 mg at 07/15/17 8527  . predniSONE (DELTASONE) tablet 50 mg  50 mg Oral Q breakfast Brand Males, MD   50 mg at 07/15/17 0953  . simethicone (MYLICON) chewable tablet 80 mg  80 mg Oral Q6H PRN Anders Simmonds, MD   80 mg at 07/12/17 2157  . sodium chloride flush (NS) 0.9 % injection 10-40 mL  10-40 mL Intracatheter PRN Elam Dutch, MD      . tiotropium [facility]) inhalation capsule 18 mcg  18 mcg Inhalation Daily Brand Males, MD   18 mcg at 07/15/17 0940  . zolpidem (AMBIEN) tablet 10 mg  10 mg Oral QHS PRN Laverle Hobby, MD   10 mg at 07/15/17 0008    Allergies as of 06/30/2017 - Review Complete 06/30/2017  Allergen Reaction Noted  . Aspirin Other (See Comments) 02/27/2014  . Clonidine derivatives Itching 08/05/2013  . Tramadol Itching 02/25/2011    Family History  Problem Relation Age of Onset  . Heart disease Mother   . Lung cancer Mother   . Heart disease Father   . Malignant hyperthermia Father   . COPD Father   . Throat cancer Sister   . Esophageal cancer Sister   . Hypertension Other   . COPD Other   . Colon cancer Neg Hx   . Colon polyps Neg Hx   . Rectal cancer Neg Hx   . Stomach cancer Neg Hx  Social History   Socioeconomic History  . Marital status: Single    Spouse name: Not on file  . Number of children: 3  . Years of education: 10  . Highest education level: Not on file  Social Needs  . Financial resource strain: Not on file  . Food insecurity - worry: Not on file  . Food insecurity - inability: Not on file  . Transportation needs - medical: Not on file  . Transportation needs - non-medical: Not on file  Occupational History  . Occupation: Unemployed  Tobacco Use  . Smoking status: Current Every Day Smoker    Packs/day: 0.50    Years: 43.00    Pack years: 21.50    Types: Cigarettes    Start date: 08/13/1973  . Smokeless tobacco: Never Used  Substance and Sexual Activity  . Alcohol use: Yes    Comment:  quit drinking in 2017  . Drug use: Yes    Types: Marijuana, Cocaine    Comment: quit in 2017"  . Sexual activity: Not on file    Comment: "NOT LATELY" on cocaine  Other Topics Concern  . Not on file  Social History Narrative   Lives alone   Caffeine use: Coffee-rare   Soda- daily      [facility] (03/10/17):   Originally from [facility]. Previously worked trimming trees. No pets currently. No bird or mold exposure.     Review of Systems: All systems reviewed and negative except where noted in HPI.  Physical Exam: Vital signs in last 24 hours: Temp:  [97.9 F (36.6 C)-98.6 F (37 C)] 97.9 F (36.6 C) (01/04 0937) Pulse Rate:  [84-98] 97 (01/04 0937) Resp:  [18-20] 20 (01/04 0937) BP: (165-189)/(72-83) 189/83 (01/04 0937) SpO2:  [93 %-99 %] 99 % (01/04 0943) Weight:  [155 lb 10.3 oz (70.6 kg)] 155 lb 10.3 oz (70.6 kg) (01/03 2117) Last BM Date: 07/13/17 General:   Alert, well-developed, black male in NAD Psych:  Pleasant, cooperative. Normal mood and affect. Eyes:  Pupils equal, sclera clear,?  icterus  Ears:  Normal auditory acuity. Nose:  No deformity, discharge,  or lesions. Neck:  Supple; no masses Lungs:  Normal respiratory effort.  Heart:  Regular rate and rhythm, 1-2 + edema Abdomen:  Soft, non-distended, nontender, BS active   Msk:  Symmetrical without gross deformities. . Neurologic:  Alert and  oriented x4;  grossly normal neurologically. Skin:  Intact without significant lesions or rashes..   Intake/Output from previous day: 01/03 0701 - 01/04 0700 In: 480 [P.O.:480] Out: 0  Intake/Output this shift: No intake/output data recorded.  Lab Results: Recent Labs    07/15/17 0035  WBC 7.4  HGB 9.3*  HCT 28.6*  PLT 216   BMET Recent Labs    07/13/17 2138 07/14/17 0944 07/15/17 0035  NA 133* 133* 131*  K 3.0* 3.0* 3.4*  CL 95* 96* 93*  CO2 27 28 24   GLUCOSE 127* 118* 183*  BUN 5* 9 19  CREATININE 2.19* 3.17* 4.06*  CALCIUM 9.0 9.2 9.4    LFT Recent Labs    07/15/17 0035  ALBUMIN 2.8*     Studies/Results: Dg Chest Port 1 View  Result Date: 07/14/2017 CLINICAL DATA:  RSV infection. Cough, shortness of breath. History of COPD, end-stage renal disease, smoker EXAM: PORTABLE CHEST 1 VIEW COMPARISON:  Chest x-ray of July 12, 2017 FINDINGS: The lungs are adequately inflated. There is persistent increased density in the retrocardiac region on the left. The cardiac silhouette  is enlarged. The central pulmonary vascularity is mildly prominent. There is mild overall interstitial prominence which is stable. There is calcification in the wall of the aortic arch. The dialysis catheter tip projects at the cavoatrial junction. IMPRESSION: COPD. Persistent left lower lobe atelectasis or pneumonia. Stable cardiomegaly with minimal pulmonary vascular congestion. Electronically Signed   By: David  Martinique M.D.   On: 07/14/2017 14:56     Tye Savoy, NP-C @  07/15/2017, 4:13 PM  Pager number [phone]

## 2017-07-15 NOTE — Progress Notes (Signed)
SATURATION QUALIFICATIONS: (This note is used to comply with regulatory documentation for home oxygen)  Patient Saturations on Room Air at Rest = 100%  Patient Saturations on Room Air while Ambulating = 93-100%  Pt ambulated 317ft on RA, HR in between 100-110. Pt denies any SOB or chest pain.

## 2017-07-15 NOTE — Progress Notes (Signed)
[name] Progress Note   Subjective:  Seen in room. Got a call this morning from his outpatient HD unit that he called them confused and said he was home? Seems appropriate during our talk today. Denies CP/dyspnea. For HD later today and likely d/c afterwards.  Objective Vitals:   07/15/17 0842 07/15/17 0937 07/15/17 0941 07/15/17 0943  BP:  (!) 189/83    Pulse:  97    Resp:  20    Temp:  97.9 F (36.6 C)    TempSrc:  Oral    SpO2: 96% 96% 99% 99%  Weight:      Height:       Physical Exam General: Well appearing man, NAD. Heart: RRR; 2/6 murmur Lungs: Coarse breath sounds throughout, no wheezing or rales Extremities: 2+ pedal edema, L thigh bandaged (graft donor site), LUE bandaged (s/p skin graft) Dialysis Access: [name] in R chest  Additional Objective Labs: Basic Metabolic Panel: Recent Labs  Lab 07/13/17 2138 07/14/17 0944 07/15/17 0035  NA 133* 133* 131*  K 3.0* 3.0* 3.4*  CL 95* 96* 93*  CO2 27 28 24   GLUCOSE 127* 118* 183*  BUN 5* 9 19  CREATININE 2.19* 3.17* 4.06*  CALCIUM 9.0 9.2 9.4  PHOS 1.8* 2.3* 2.5   Liver Function Tests: Recent Labs  Lab 07/13/17 2138 07/14/17 0944 07/15/17 0035  ALBUMIN 2.9* 2.6* 2.8*   CBC: Recent Labs  Lab 07/10/17 0321 07/10/17 0404  07/11/17 0303 07/12/17 0400 07/12/17 1100 07/15/17 0035  WBC 9.8 7.5  --  7.3 5.7  --  7.4  NEUTROABS  --   --   --   --   --   --  5.9  HGB 8.3* 7.1*   < > 7.1* 6.9* 8.0* 9.3*  HCT 25.4* 21.8*   < > 21.4* 20.4* 23.9* 28.6*  MCV 82.5 82.3  --  80.1 78.8  --  77.9*  PLT 164 139*  --  158 149*  --  216   < > = values in this interval not displayed.   CBG: Recent Labs  Lab 07/12/17 1218 07/12/17 1553 07/13/17 0740 07/13/17 1112 07/13/17 1511  GLUCAP 125* 114* 109* 129* 157*   Studies/Results: Dg Chest Port 1 View  Result Date: 07/14/2017 CLINICAL DATA:  RSV infection. Cough, shortness of breath. History of COPD, end-stage renal disease, smoker EXAM: PORTABLE  CHEST 1 VIEW COMPARISON:  Chest x-ray of July 12, 2017 FINDINGS: The lungs are adequately inflated. There is persistent increased density in the retrocardiac region on the left. The cardiac silhouette is enlarged. The central pulmonary vascularity is mildly prominent. There is mild overall interstitial prominence which is stable. There is calcification in the wall of the aortic arch. The dialysis catheter tip projects at the cavoatrial junction. IMPRESSION: COPD. Persistent left lower lobe atelectasis or pneumonia. Stable cardiomegaly with minimal pulmonary vascular congestion. Electronically Signed   By: [name]  [name] M.D.   On: 07/14/2017 14:56   Medications: . sodium chloride Stopped (07/12/17 2000)   . atorvastatin  20 mg Oral q1800  . bacitracin   Topical Daily  . Chlorhexidine Gluconate Cloth  6 each Topical Daily  . clopidogrel  75 mg Oral Daily  . darbepoetin (ARANESP) injection - DIALYSIS  200 mcg Intravenous Q Fri-HD  . diltiazem  120 mg Oral Daily  . docusate  100 mg Oral BID  . feeding supplement (NEPRO CARB STEADY)  237 mL Oral BID BM  . feeding supplement (PRO-STAT SUGAR FREE 64)  30 mL Oral BID  . heparin  5,000 Units Subcutaneous Q8H  . mometasone-formoterol  2 puff Inhalation BID  . multivitamin  1 tablet Oral QHS  . nicotine  14 mg Transdermal Daily  . pantoprazole  40 mg Oral Daily  . predniSONE  50 mg Oral Q breakfast  . tiotropium  18 mcg Inhalation Daily    Dialysis Orders: MWF at [facility] 4h 73kg Hep 3000 400/800 2/2 bath  - Mircera 289mcg IV q 2 weeks (last 12/19) - Cinacalcet 120mg  TIW  Assessment/Plan: 1. LUE skin necrosis after AVF excision: S/p skin graft 07/04/17. VVS following. 2. Acute on Chronic respiratory failure: +RSV pneumonia. Completed IV abx, still on prednisone. S/p extubation 12/30/18improved with UF.  3. A fib with RVR: On diltiazem per cardiology. 4. ESRD: Req'd CVVHD 12/29/18thru 07/12/17.Transitioned back to regular HD on 07/13/17,  next 07/15/17. 5. Anemia:S/p 1U PRBCs on 07/12/17.Follow, re-dose ESA with next HD. 6. CKD-MBD:Ca ok, Phos trending back up. Binders/sensipar on hold for now. 7. Nutrition:Alb low, resuming protein supps. 8. Hep B: continue with contact precautions 9. CAD: per Cardiology    Veneta Penton, PA-C 07/15/2017, 10:19 AM  [facility] Pager: [phone]

## 2017-07-15 NOTE — Progress Notes (Signed)
CRITICAL VALUE ALERT  Critical Value:  Lactic Acid 2.1  Date & Time Notied:  07/15/17 01:36  Provider Notified: Dr. Donzetta Matters  Orders Received/Actions taken: no

## 2017-07-15 NOTE — Consult Note (Signed)
History and Physical    Early Steel TGG:[id number] DOB: 03-12-1961 DOA: 07/04/2017  PCP: [name], MD  Consulting MD:  Dr Hilarie Fredrickson with GI   Chief Complaint:  Rectal bleeding  HPI: [name] is a 57 y.o. male with medical history significant of current hospitalization for wound defect requiring skin graft by vascular surgery on 12/24.  Pt has h/o ESRD dialysis MWF, noncompliance, cocaine abuse, pulm HTN, copd, htn, CAD on 12/28 went in acute hypoxic respiratory failure requiring emergent intubation by PCCM found to have pna on vanc /zosyn, viral panel came back with RSV.  On 12/30 pt had unstable wide complex tachycardia with no lose of pulses responded to defibrillation, amio bolus, lidocaine, mag, calcium, levophed.  Loaded with amiodorone.  Weaned and extubated on 12/30.  Needed blood transfusion during hospitalization also.  Pt overall has been improving moved out of icu on 1/1.  Pt received dialysis today and was in the process of being discharged when he developed acute bright red blood per rectum.  No fevers.  He is complaining of lower abdominal cramping.  No nausea or vomiting.  GI was consulted who have ordered a stat CTA of his abdomin and pelvis.  Pt bp has been stable.  He has been on plavix and sq heparin.  These have been stopped today.  His last hgb was 9.  We are called to consult in helping with his overall medical needs.  Pt bp remains stable over the last several hours.  He has had about 8 bloody bowel movements per RN on floor.    Review of Systems: As per HPI otherwise 10 point review of systems negative.   Past Medical History:  Diagnosis Date  . Adenomatous colon polyp    tubular  . Anemia   . Anxiety   . Arthritis    left shoulder  . Atherosclerosis of aorta ([place])   . Cardiomegaly   . Chest pain    DATE UNKNOWN, C/O PERIODICALLY  . Cocaine abuse ([place])   . COPD exacerbation ([place]) 08/17/2016  . Coronary artery disease    stent 02/22/17    . Dialysis patient ([place])    Monday-Wednesday-Friday  . ESRD (end stage renal disease) on dialysis ([place])    "[name]; MWF" (07/04/2017)  . GERD (gastroesophageal reflux disease)    DATE UNKNOWN  . Hemorrhoids   . Hepatitis B, chronic ([place])   . Hepatitis C   . History of kidney stones   . Hyperkalemia   . Hypertension   . Metabolic bone disease    Patient denies  . Myocardial infarction ([place])   . Pneumonia   . Pulmonary edema   . Renal disorder   . Renal insufficiency   . Shortness of breath dyspnea    " for the last past year with this dialysis"  . Tubular adenoma of colon     Past Surgical History:  Procedure Laterality Date  . A/V FISTULAGRAM Left 05/26/2017   Procedure: A/V FISTULAGRAM;  Surgeon: Conrad Llano, MD;  Location: [place] [facility];  Service: Cardiovascular;  Laterality: Left;  . APPLICATION OF WOUND VAC Left 06/14/2017   Procedure: APPLICATION OF WOUND VAC;  Surgeon: Katha Cabal, MD;  Location: [facility];  Service: Vascular;  Laterality: Left;  . AV FISTULA PLACEMENT  2012   BELIEVED WAS PLACED IN JUNE  . COLONOSCOPY    . CORONARY STENT INTERVENTION N/A 02/22/2017   Procedure: CORONARY STENT INTERVENTION;  Surgeon: Vernell Leep  J, MD;  Location: [place] [facility];  Service: Cardiovascular;  Laterality: N/A;  . HEMORRHOID BANDING    . I&D EXTREMITY Left 06/01/2017   Procedure: IRRIGATION AND DEBRIDEMENT LEFT ARM HEMATOMA WITH LIGATION OF LEFT ARM AV FISTULA;  Surgeon: Elam Dutch, MD;  Location: [place];  Service: Vascular;  Laterality: Left;  . I&D EXTREMITY Left 06/14/2017   Procedure: IRRIGATION AND DEBRIDEMENT EXTREMITY;  Surgeon: Katha Cabal, MD;  Location: [facility];  Service: Vascular;  Laterality: Left;  . INSERTION OF DIALYSIS CATHETER  05/30/2017  . INSERTION OF DIALYSIS CATHETER N/A 05/30/2017   Procedure: INSERTION OF DIALYSIS CATHETER;  Surgeon: Elam Dutch, MD;  Location: [facility];  Service: Vascular;  Laterality:  N/A;  . LEFT HEART CATH AND CORONARY ANGIOGRAPHY N/A 02/22/2017   Procedure: LEFT HEART CATH AND CORONARY ANGIOGRAPHY;  Surgeon: Nigel Mormon, MD;  Location: [place] [facility];  Service: Cardiovascular;  Laterality: N/A;  . LIGATION OF ARTERIOVENOUS  FISTULA Left 0/01/3709   Procedure: Plication of Left Arm Arteriovenous Fistula;  Surgeon: Elam Dutch, MD;  Location: [place];  Service: Vascular;  Laterality: Left;  . POLYPECTOMY    . REVISON OF ARTERIOVENOUS FISTULA Left 01/05/9484   Procedure: PLICATION OF DISTAL ANEURYSMAL SEGEMENT OF LEFT UPPER ARM ARTERIOVENOUS FISTULA;  Surgeon: Elam Dutch, MD;  Location: [place];  Service: Vascular;  Laterality: Left;  . REVISON OF ARTERIOVENOUS FISTULA Left 4/62/7035   Procedure: Plication of Left Upper Arm Fistula ;  Surgeon: Waynetta Sandy, MD;  Location: [place];  Service: Vascular;  Laterality: Left;  . SKIN GRAFT SPLIT THICKNESS LEG / FOOT Left    SKIN GRAFT SPLIT THICKNESS LEFT ARM DONOR SITE: LEFT ANTERIOR THIGH  . SKIN SPLIT GRAFT Left 07/04/2017   Procedure: SKIN GRAFT SPLIT THICKNESS LEFT ARM DONOR SITE: LEFT ANTERIOR THIGH;  Surgeon: Elam Dutch, MD;  Location: [place];  Service: Vascular;  Laterality: Left;  . THROMBECTOMY W/ EMBOLECTOMY Left 06/05/2017   Procedure: EXPLORATION OF LEFT ARM FOR BLEEDING; OVERSEWED PROXIMAL FISTULA;  Surgeon: Angelia Mould, MD;  Location: [place];  Service: Vascular;  Laterality: Left;  . WOUND EXPLORATION Left 06/03/2017   Procedure: WOUND EXPLORATION WITH WOUND VAC APPLICATION TO LEFT ARM;  Surgeon: Angelia Mould, MD;  Location: [place];  Service: Vascular;  Laterality: Left;     reports that he has been smoking cigarettes.  He started smoking about 43 years ago. He has a 21.50 pack-year smoking history. he has never used smokeless tobacco. He reports that he drinks alcohol. He reports that he uses drugs. Drugs: Marijuana and Cocaine.  Allergies  Allergen Reactions  .  Aspirin Other (See Comments)    STOMACH PAIN  . Clonidine Derivatives Itching  . Tramadol Itching    Family History  Problem Relation Age of Onset  . Heart disease Mother   . Lung cancer Mother   . Heart disease Father   . Malignant hyperthermia Father   . COPD Father   . Throat cancer Sister   . Esophageal cancer Sister   . Hypertension Other   . COPD Other   . Colon cancer Neg Hx   . Colon polyps Neg Hx   . Rectal cancer Neg Hx   . Stomach cancer Neg Hx     Prior to Admission medications   Medication Sig Start Date End Date Taking? Authorizing Provider  acetaminophen (TYLENOL) 325 MG tablet Take 2 tablets (650 mg total) by  mouth every 6 (six) hours as needed for mild pain (or Fever >/= 101). 06/23/17  Yes Shahmehdi, Seyed A, MD  budesonide-formoterol (SYMBICORT) 160-4.5 MCG/ACT inhaler Inhale 2 puffs into the lungs every 12 (twelve) hours.   Yes [provider]  calcitRIOL (ROCALTROL) 0.25 MCG capsule Take 3 capsules (0.75 mcg total) by mouth every Monday, Wednesday, and Friday with hemodialysis. 03/28/17  Yes Cherene Altes, MD  diltiazem (CARDIZEM CD) 120 MG 24 hr capsule Take 1 capsule (120 mg total) by mouth daily. 06/23/17 06/23/18 Yes Shahmehdi, Seyed A, MD  gabapentin (NEURONTIN) 100 MG capsule Take 100 mg 2 (two) times daily by mouth.   Yes [provider]  isosorbide mononitrate (IMDUR) 30 MG 24 hr tablet Take 1 tablet (30 mg total) by mouth daily. 06/17/17  Yes [name], MD  Melatonin 5 MG TABS Take 5 mg by mouth at bedtime as needed (sleep).   Yes [provider]  minoxidil (LONITEN) 2.5 MG tablet Take 1 tablet (2.5 mg total) by mouth 2 (two) times daily. 06/23/17 07/23/17 Yes Shahmehdi, Seyed A, MD  nicotine (NICODERM CQ - DOSED IN MG/24 HOURS) 14 mg/24hr patch Place 14 mg daily onto the skin. 03/30/17  Yes [provider]  sevelamer carbonate (RENVELA) 800 MG tablet Take 4 tablets (3,200 mg total) by mouth 3 (three) times daily  with meals. 06/10/17  Yes Mariel Aloe, MD  zolpidem (AMBIEN) 10 MG tablet Take 1 tablet (10 mg total) by mouth at bedtime as needed for sleep. 06/10/17  Yes Mariel Aloe, MD  albuterol (PROVENTIL) (2.5 MG/3ML) 0.083% nebulizer solution Inhale 3 mLs (2.5 mg total) into the lungs every 4 (four) hours as needed for wheezing or shortness of breath. 07/15/17   Dagoberto Ligas, PA-C  clopidogrel (PLAVIX) 75 MG tablet Take 1 tablet (75 mg total) by mouth daily. 07/15/17   Rhyne, Hulen Shouts, PA-C  multivitamin (RENA-VIT) TABS tablet Take 1 tablet by mouth at bedtime. 06/17/17   [name], MD  oxyCODONE (ROXICODONE) 5 MG immediate release tablet Take 1 tablet (5 mg total) by mouth every 8 (eight) hours as needed. 07/07/17 07/07/18  Dagoberto Ligas, PA-C  predniSONE (DELTASONE) 10 MG tablet Take 4 tablets (40 mg total) by mouth daily for 3 days, THEN 3 tablets (30 mg total) daily for 3 days, THEN 2 tablets (20 mg total) daily for 3 days, THEN 1 tablet (10 mg total) daily for 3 days. 07/15/17 07/27/17  Dagoberto Ligas, PA-C  senna (SENOKOT) 8.6 MG TABS tablet Take 1 tablet (8.6 mg total) by mouth daily as needed for mild constipation. 06/10/17   Mariel Aloe, MD  Spacer/Aero-Holding Chambers (AEROCHAMBER MV) inhaler Use as instructed 03/10/17   Javier Glazier, MD  tiotropium [facility]) 18 MCG inhalation capsule Place 1 capsule (18 mcg total) into inhaler and inhale daily. 07/16/17   Dagoberto Ligas, PA-C  vitamin C (VITAMIN C) 500 MG tablet Take 1 tablet (500 mg total) by mouth 2 (two) times daily. 06/17/17   [name], MD    Physical Exam: Vitals:   07/15/17 4098 07/15/17 0941 07/15/17 0943 07/15/17 1753  BP: (!) 189/83   (!) 148/74  Pulse: 97     Resp: 20     Temp: 97.9 F (36.6 C)   97.8 F (36.6 C)  TempSrc: Oral   Oral  SpO2: 96% 99% 99% 100%  Weight:      Height:          Constitutional: NAD, calm, comfortable  Vitals:   07/15/17 0937 07/15/17 0941 07/15/17 0943 07/15/17 1753  BP:  (!) 189/83   (!) 148/74  Pulse: 97     Resp: 20     Temp: 97.9 F (36.6 C)   97.8 F (36.6 C)  TempSrc: Oral   Oral  SpO2: 96% 99% 99% 100%  Weight:      Height:       Eyes: PERRL, lids and conjunctivae normal ENMT: Mucous membranes are moist. Posterior pharynx clear of any exudate or lesions.Normal dentition.  Neck: normal, supple, no masses, no thyromegaly Respiratory: clear to auscultation bilaterally, no wheezing, no crackles. Normal respiratory effort. No accessory muscle use.  Cardiovascular: Regular rate and rhythm, no murmurs / rubs / gallops. No extremity edema. 2+ pedal pulses. No carotid bruits.  Abdomen: no tenderness, no masses palpated. No hepatosplenomegaly. Bowel sounds positive.  Musculoskeletal: no clubbing / cyanosis. No joint deformity upper and lower extremities. Good ROM, no contractures. Normal muscle tone.  Skin: no rashes, lesions, ulcers. No induration Neurologic: CN 2-12 grossly intact. Sensation intact, DTR normal. Strength 5/5 in all 4.  Psychiatric: Normal judgment and insight. Alert and oriented x 3. Normal mood.    Labs on Admission: I have personally reviewed following labs and imaging studies  CBC: Recent Labs  Lab 07/10/17 0321 07/10/17 0404 07/10/17 0423 07/11/17 0303 07/12/17 0400 07/12/17 1100 07/15/17 0035  WBC 9.8 7.5  --  7.3 5.7  --  7.4  NEUTROABS  --   --   --   --   --   --  5.9  HGB 8.3* 7.1* 8.5* 7.1* 6.9* 8.0* 9.3*  HCT 25.4* 21.8* 25.0* 21.4* 20.4* 23.9* 28.6*  MCV 82.5 82.3  --  80.1 78.8  --  77.9*  PLT 164 139*  --  158 149*  --  622   Basic Metabolic Panel: Recent Labs  Lab 07/11/17 0303  07/12/17 0400 07/13/17 0422 07/13/17 2138 07/14/17 0944 07/15/17 0035  NA 134*   < > 133* 131* 133* 133* 131*  K 4.5   < > 3.5 3.6 3.0* 3.0* 3.4*  CL 95*   < > 96* 97* 95* 96* 93*  CO2 23   < > 26 24 27 28 24   GLUCOSE 99   < > 104* 113* 127* 118* 183*  BUN 25*   < > 14 15 5* 9 19  CREATININE 4.42*   < > 2.67* 3.45* 2.19*  3.17* 4.06*  CALCIUM 9.0   < > 8.9 8.9 9.0 9.2 9.4  MG 2.3  --  2.4 2.4  --  2.1 2.2  PHOS 3.2   < > 2.2* 2.1* 1.8* 2.3* 2.5   < > = values in this interval not displayed.   GFR: Estimated Creatinine Clearance: 20.8 mL/min (A) (by C-G formula based on SCr of 4.06 mg/dL (H)). Liver Function Tests: Recent Labs  Lab 07/12/17 0400 07/13/17 0422 07/13/17 2138 07/14/17 0944 07/15/17 0035  ALBUMIN 2.3* 2.5* 2.9* 2.6* 2.8*   No results for input(s): LIPASE, AMYLASE in the last 168 hours. No results for input(s): AMMONIA in the last 168 hours. Coagulation Profile: No results for input(s): INR, PROTIME in the last 168 hours. Cardiac Enzymes: Recent Labs  Lab 07/10/17 0404  TROPONINI 0.32*   BNP (last 3 results) No results for input(s): PROBNP in the last 8760 hours. HbA1C: No results for input(s): HGBA1C in the last 72 hours. CBG: Recent Labs  Lab 07/12/17 1218 07/12/17 1553 07/13/17 0740 07/13/17 1112  07/13/17 1511  GLUCAP 125* 114* 109* 129* 157*   Lipid Profile: No results for input(s): CHOL, HDL, LDLCALC, TRIG, CHOLHDL, LDLDIRECT in the last 72 hours. Thyroid Function Tests: No results for input(s): TSH, T4TOTAL, FREET4, T3FREE, THYROIDAB in the last 72 hours. Anemia Panel: No results for input(s): VITAMINB12, FOLATE, FERRITIN, TIBC, IRON, RETICCTPCT in the last 72 hours. Urine analysis:    Component Value Date/Time   COLORURINE YELLOW 07/16/2011 1619   APPEARANCEUR CLEAR 07/16/2011 1619   LABSPEC 1.018 07/16/2011 1619   PHURINE 7.5 07/16/2011 1619   GLUCOSEU 250 (A) 07/16/2011 1619   HGBUR MODERATE (A) 07/16/2011 1619   BILIRUBINUR SMALL (A) 07/16/2011 1619   KETONESUR NEGATIVE 07/16/2011 1619   PROTEINUR >300 (A) 07/16/2011 1619   UROBILINOGEN 1.0 07/16/2011 1619   NITRITE NEGATIVE 07/16/2011 1619   LEUKOCYTESUR SMALL (A) 07/16/2011 1619   Sepsis Labs: !!!!!!!!!!!!!!!!!!!!!!!!!!!!!!!!!!!!!!!!!!!! @LABRCNTIP (procalcitonin:4,lacticidven:4) ) Recent Results  (from the past 240 hour(s))  Culture, blood (Routine X 2) w Reflex to ID Panel     Status: None   Collection Time: 07/08/17  8:00 PM  Result Value Ref Range Status   Specimen Description BLOOD RIGHT ANTECUBITAL  Final   Special Requests   Final    BOTTLES DRAWN AEROBIC ONLY Blood Culture adequate volume IV SITE   Culture NO GROWTH 5 DAYS  Final   Report Status 07/13/2017 FINAL  Final  Culture, blood (Routine X 2) w Reflex to ID Panel     Status: None   Collection Time: 07/08/17  8:13 PM  Result Value Ref Range Status   Specimen Description BLOOD RIGHT HAND  Final   Special Requests   Final    BOTTLES DRAWN AEROBIC ONLY Blood Culture adequate volume   Culture NO GROWTH 5 DAYS  Final   Report Status 07/13/2017 FINAL  Final  Respiratory Panel by PCR     Status: Abnormal   Collection Time: 07/09/17  2:16 AM  Result Value Ref Range Status   Adenovirus NOT DETECTED NOT DETECTED Final   Coronavirus 229E NOT DETECTED NOT DETECTED Final   Coronavirus HKU1 NOT DETECTED NOT DETECTED Final   Coronavirus NL63 NOT DETECTED NOT DETECTED Final   Coronavirus OC43 NOT DETECTED NOT DETECTED Final   Metapneumovirus NOT DETECTED NOT DETECTED Final   Rhinovirus / Enterovirus NOT DETECTED NOT DETECTED Final   Influenza A NOT DETECTED NOT DETECTED Final   Influenza B NOT DETECTED NOT DETECTED Final   Parainfluenza Virus 1 NOT DETECTED NOT DETECTED Final   Parainfluenza Virus 2 NOT DETECTED NOT DETECTED Final   Parainfluenza Virus 3 NOT DETECTED NOT DETECTED Final   Parainfluenza Virus 4 NOT DETECTED NOT DETECTED Final   Respiratory Syncytial Virus DETECTED (A) NOT DETECTED Final    Comment: CRITICAL RESULT CALLED TO, READ BACK BY AND VERIFIED WITHArloa Koh RN [id number] 07/09/17 A BROWNING    Bordetella pertussis NOT DETECTED NOT DETECTED Final   Chlamydophila pneumoniae NOT DETECTED NOT DETECTED Final   Mycoplasma pneumoniae NOT DETECTED NOT DETECTED Final     Radiological Exams on Admission: Dg Chest  Port 1 View  Result Date: 07/14/2017 CLINICAL DATA:  RSV infection. Cough, shortness of breath. History of COPD, end-stage renal disease, smoker EXAM: PORTABLE CHEST 1 VIEW COMPARISON:  Chest x-ray of July 12, 2017 FINDINGS: The lungs are adequately inflated. There is persistent increased density in the retrocardiac region on the left. The cardiac silhouette is enlarged. The central pulmonary vascularity is mildly prominent. There is mild overall interstitial  prominence which is stable. There is calcification in the wall of the aortic arch. The dialysis catheter tip projects at the cavoatrial junction. IMPRESSION: COPD. Persistent left lower lobe atelectasis or pneumonia. Stable cardiomegaly with minimal pulmonary vascular congestion. Electronically Signed   By: Darelle Kings  [place] M.D.   On: 07/14/2017 14:56    Case discussed with dr Hilarie Fredrickson with GI team Old chart reviewed  Assessment/Plan 57 yo male with many medical issues with acute LGIB  Principal Problem:   Hematochezia- repeat h/h now and q 8 hours.  bp stable.  Ck GI panel and cdiff.  Pt has h/o hemorrhoids and no diverticulosis per cscope in 2014, also with no h/o varices.  Follow up stat cta being done tonight.    Active Problems:   Chronic hepatitis C without hepatic coma (HCC)- noted   COPD (chronic obstructive pulmonary disease) (HCC)- stable at this time   Personality disorder ([place])- noted   CKD (chronic kidney disease) stage V requiring chronic dialysis ([place])- dialysis today, gentle ivf boluses as needed, transfuse blood if hgb drops below 8   History of Cocaine abuse ([place])- noted   Anemia due to chronic kidney disease- serial h/h q 8 hours   ESRD (end stage renal disease) ([place])- noted    DVT prophylaxis:  None, acute gib, scds Code Status:  full Family Communication:  none Disposition Plan:  Per vascular surgery, once gi issues resolved     Jhordan Mckibben A MD [facility]  If 7PM-7AM, please contact  night-coverage [email or web address] Password [facility]  07/15/2017, 7:36 PM

## 2017-07-15 NOTE — Telephone Encounter (Signed)
lmtcb x1 for pt. Please see telephone encounter 07/07/17.

## 2017-07-15 NOTE — Progress Notes (Signed)
Pt had BRBPR x1 today. DR. Donzetta Matters notified.

## 2017-07-15 NOTE — Progress Notes (Signed)
  Progress Note    07/15/2017 8:50 AM 11 Days Post-Op  Subjective: wants to go home  Vitals:   07/15/17 0839 07/15/17 0842  BP:    Pulse:    Resp:    Temp:    SpO2: 97% 96%    Physical Exam: [id number] Non labored respirations on room air Dressing on left arm cdi   CBC    Component Value Date/Time   WBC 7.4 07/15/2017 0035   RBC 3.67 (L) 07/15/2017 0035   HGB 9.3 (L) 07/15/2017 0035   HCT 28.6 (L) 07/15/2017 0035   PLT 216 07/15/2017 0035   MCV 77.9 (L) 07/15/2017 0035   MCH 25.3 (L) 07/15/2017 0035   MCHC 32.5 07/15/2017 0035   RDW 19.9 (H) 07/15/2017 0035   LYMPHSABS 1.3 07/15/2017 0035   MONOABS 0.2 07/15/2017 0035   EOSABS 0.0 07/15/2017 0035   BASOSABS 0.0 07/15/2017 0035    BMET    Component Value Date/Time   NA 131 (L) 07/15/2017 0035   K 3.4 (L) 07/15/2017 0035   CL 93 (L) 07/15/2017 0035   CO2 24 07/15/2017 0035   GLUCOSE 183 (H) 07/15/2017 0035   BUN 19 07/15/2017 0035   CREATININE 4.06 (H) 07/15/2017 0035   CREATININE 9.18 (H) 02/15/2017 1425   CALCIUM 9.4 07/15/2017 0035   CALCIUM 8.1 (L) 07/11/2011 1117   GFRNONAA 15 (L) 07/15/2017 0035   GFRNONAA 6 (L) 02/15/2017 1425   GFRAA 18 (L) 07/15/2017 0035   GFRAA 7 (L) 02/15/2017 1425    INR    Component Value Date/Time   INR 1.20 06/14/2017 0936     Intake/Output Summary (Last 24 hours) at 07/15/2017 0850 Last data filed at 07/15/2017 0557 Gross per 24 hour  Intake 480 ml  Output 0 ml  Net 480 ml     Assessment:  57 y.o. male is s/p left arm stsg  Plan: Steroid taper per pulmonary Hd per nephrology Daily dressing changes Possible d/c today   Kristyanna Barcelo C. Donzetta Matters, MD [facility] Office: [phone] Pager: [phone]  07/15/2017 8:50 AM

## 2017-07-15 NOTE — Consult Note (Signed)
.. ..  Name: [name] MRN: [id number] DOB: [birth year]    ADMISSION DATE:  07/04/2017 CONSULTATION DATE:  07/15/17  REFERRING MD :  FIELDS MD   CHIEF COMPLAINT:  ACUTE LGIB  BRIEF PATIENT DESCRIPTION: 57 yr old male with PMHx of COPD, CAD, Substance abuse, HTN, ESRD on HD, Hep B& C, and Tubular adenoma was admitted by [facility] service on 12/24.   SIGNIFICANT EVENTS  Painless hematochezia  STUDIES:  CBC CT ANGIO/ABD/PELVIS   HISTORY OF PRESENT ILLNESS: 57 year old male with past medical history of COPD, CAD, substance abuse cocaine, hypertension, end-stage renal disease on HD, hep B and hep C, history of a tubular adenoma 5 years ago no diverticulosis at that time on 07/15/2017. Recently admitted for worsening resp distress required intubation was positive for RSV. He received scheduled HD and then began to have BRBPR with external hemorrhoids noted on exam. Pt was seen by Vascular and GI. Painless hematochezia continued for several hrs last Hgb noted to be 5.5. CTA showed an active bleed in the rectum.  PCCM consulted for mgmt of Acute Lower GI Bleed.  PAST MEDICAL HISTORY :   has a past medical history of Adenomatous colon polyp, Anemia, Anxiety, Arthritis, Atherosclerosis of aorta (HCC), Cardiomegaly, Chest pain, Cocaine abuse ([place]), COPD exacerbation ([place]) (08/17/2016), Coronary artery disease, Dialysis patient ([place]), ESRD (end stage renal disease) on dialysis ([place]), GERD (gastroesophageal reflux disease), Hemorrhoids, Hepatitis B, chronic ([place]), Hepatitis C, History of kidney stones, Hyperkalemia, Hypertension, Metabolic bone disease, Myocardial infarction ([place]), Pneumonia, Pulmonary edema, Renal disorder, Renal insufficiency, Shortness of breath dyspnea, and Tubular adenoma of colon.  has a past surgical history that includes AV fistula placement (2012); Hemorrhoid banding; Ligation of arteriovenous  fistula (Left, 12/14/2015); Revison of arteriovenous fistula (Left, 01/20/2016);  Revison of arteriovenous fistula (Left, 07/24/2016); Colonoscopy; Polypectomy; LEFT HEART CATH AND CORONARY ANGIOGRAPHY (N/A, 02/22/2017); CORONARY STENT INTERVENTION (N/A, 02/22/2017); A/V Fistulagram (Left, 05/26/2017); Insertion of dialysis catheter (05/30/2017); Insertion of dialysis catheter (N/A, 05/30/2017); I&D extremity (Left, 06/01/2017); Wound exploration (Left, 06/03/2017); Thrombectomy w/ embolectomy (Left, 06/05/2017); I&D extremity (Left, 06/14/2017); Application if wound vac (Left, 06/14/2017); Skin graft split thickness leg / foot (Left); and Skin split graft (Left, 07/04/2017). Prior to Admission medications   Medication Sig Start Date End Date Taking? Authorizing Provider  acetaminophen (TYLENOL) 325 MG tablet Take 2 tablets (650 mg total) by mouth every 6 (six) hours as needed for mild pain (or Fever >/= 101). 06/23/17  Yes Shahmehdi, Seyed A, MD  budesonide-formoterol (SYMBICORT) 160-4.5 MCG/ACT inhaler Inhale 2 puffs into the lungs every 12 (twelve) hours.   Yes [provider]  calcitRIOL (ROCALTROL) 0.25 MCG capsule Take 3 capsules (0.75 mcg total) by mouth every Monday, Wednesday, and Friday with hemodialysis. 03/28/17  Yes Cherene Altes, MD  diltiazem (CARDIZEM CD) 120 MG 24 hr capsule Take 1 capsule (120 mg total) by mouth daily. 06/23/17 06/23/18 Yes Shahmehdi, Seyed A, MD  gabapentin (NEURONTIN) 100 MG capsule Take 100 mg 2 (two) times daily by mouth.   Yes [provider]  isosorbide mononitrate (IMDUR) 30 MG 24 hr tablet Take 1 tablet (30 mg total) by mouth daily. 06/17/17  Yes [name], MD  Melatonin 5 MG TABS Take 5 mg by mouth at bedtime as needed (sleep).   Yes [provider]  minoxidil (LONITEN) 2.5 MG tablet Take 1 tablet (2.5 mg total) by mouth 2 (two) times daily. 06/23/17 07/23/17 Yes Shahmehdi, Valeria Batman, MD  nicotine (NICODERM CQ - DOSED IN MG/24  HOURS) 14 mg/24hr patch Place 14 mg daily onto the skin. 03/30/17  Yes [provider]   sevelamer carbonate (RENVELA) 800 MG tablet Take 4 tablets (3,200 mg total) by mouth 3 (three) times daily with meals. 06/10/17  Yes Mariel Aloe, MD  zolpidem (AMBIEN) 10 MG tablet Take 1 tablet (10 mg total) by mouth at bedtime as needed for sleep. 06/10/17  Yes Mariel Aloe, MD  albuterol (PROVENTIL) (2.5 MG/3ML) 0.083% nebulizer solution Inhale 3 mLs (2.5 mg total) into the lungs every 4 (four) hours as needed for wheezing or shortness of breath. 07/15/17   Dagoberto Ligas, PA-C  clopidogrel (PLAVIX) 75 MG tablet Take 1 tablet (75 mg total) by mouth daily. 07/15/17   Rhyne, Hulen Shouts, PA-C  multivitamin (RENA-VIT) TABS tablet Take 1 tablet by mouth at bedtime. 06/17/17   [name], MD  oxyCODONE (ROXICODONE) 5 MG immediate release tablet Take 1 tablet (5 mg total) by mouth every 8 (eight) hours as needed. 07/07/17 07/07/18  Dagoberto Ligas, PA-C  predniSONE (DELTASONE) 10 MG tablet Take 4 tablets (40 mg total) by mouth daily for 3 days, THEN 3 tablets (30 mg total) daily for 3 days, THEN 2 tablets (20 mg total) daily for 3 days, THEN 1 tablet (10 mg total) daily for 3 days. 07/15/17 07/27/17  Dagoberto Ligas, PA-C  senna (SENOKOT) 8.6 MG TABS tablet Take 1 tablet (8.6 mg total) by mouth daily as needed for mild constipation. 06/10/17   Mariel Aloe, MD  Spacer/Aero-Holding Chambers (AEROCHAMBER MV) inhaler Use as instructed 03/10/17   Javier Glazier, MD  tiotropium [facility]) 18 MCG inhalation capsule Place 1 capsule (18 mcg total) into inhaler and inhale daily. 07/16/17   Dagoberto Ligas, PA-C  vitamin C (VITAMIN C) 500 MG tablet Take 1 tablet (500 mg total) by mouth 2 (two) times daily. 06/17/17   [name], MD   Allergies  Allergen Reactions  . Aspirin Other (See Comments)    STOMACH PAIN  . Clonidine Derivatives Itching  . Tramadol Itching    FAMILY HISTORY:  family history includes COPD in his father and other; Esophageal cancer in his sister; Heart disease in his father and  mother; Hypertension in his other; Lung cancer in his mother; Malignant hyperthermia in his father; Throat cancer in his sister. SOCIAL HISTORY:  reports that he has been smoking cigarettes.  He started smoking about 43 years ago. He has a 21.50 pack-year smoking history. he has never used smokeless tobacco. He reports that he drinks alcohol. He reports that he uses drugs. Drugs: Marijuana and Cocaine.  REVIEW OF SYSTEMS:  Otherwise negative Constitutional: Negative for fever, chills, weight loss, malaise/fatigue and diaphoresis.  HENT: Negative for hearing loss, ear pain, nosebleeds, congestion, sore throat, neck pain, tinnitus and ear discharge.   Eyes: Negative for blurred vision, double vision, photophobia, pain, discharge and redness.  Respiratory: Negative for cough, hemoptysis, sputum production, shortness of breath, wheezing and stridor.   Cardiovascular: Negative for chest pain, palpitations, orthopnea, claudication, leg swelling and PND.  Gastrointestinal: Negative for heartburn, nausea, vomiting, abdominal pain, diarrhea, constipation, blood in stool and melena.  Genitourinary: Negative for dysuria, urgency, frequency, hematuria and flank pain.  Musculoskeletal: Negative for myalgias, back pain, joint pain and falls.  Skin: Negative for itching and rash.  Neurological: Negative for dizziness, tingling, tremors, sensory change, speech change, focal weakness, seizures, loss of consciousness, weakness and headaches.  Endo/Heme/Allergies: Negative for environmental allergies and polydipsia. Does not bruise/bleed easily.  SUBJECTIVE:  VITAL SIGNS: Temp:  [97.8 F (36.6 C)-98.5 F (36.9 C)] 97.8 F (36.6 C) (01/04 1753) Pulse Rate:  [97-98] 97 (01/04 1900) Resp:  [20] 20 (01/04 0937) BP: (106-189)/(46-83) 106/46 (01/04 2209) SpO2:  [96 %-100 %] 100 % (01/04 1900)  PHYSICAL EXAMINATION: General:  Not in acute distress Neuro:  No focal deficits, intact CN exam HEENT:  NCAT    Cardiovascular:  Reg rate and rhythm S1 and S2 Lungs:  Clear to auscultation bilaterally no wheezing Abdomen:  BS + NT ND  Musculoskeletal:  No gross deformities Skin:  Grossly intact  Recent Labs  Lab 07/13/17 2138 07/14/17 0944 07/15/17 0035  NA 133* 133* 131*  K 3.0* 3.0* 3.4*  CL 95* 96* 93*  CO2 27 28 24   BUN 5* 9 19  CREATININE 2.19* 3.17* 4.06*  GLUCOSE 127* 118* 183*   Recent Labs  Lab 07/12/17 0400  07/15/17 0035 07/15/17 1830 07/15/17 2014  HGB 6.9*   < > 9.3* 7.1* 5.5*  HCT 20.4*   < > 28.6* 21.0* 16.0*  WBC 5.7  --  7.4 14.8*  --   PLT 149*  --  216 269  --    < > = values in this interval not displayed.   Dg Chest Port 1 View  Result Date: 07/14/2017 CLINICAL DATA:  RSV infection. Cough, shortness of breath. History of COPD, end-stage renal disease, smoker EXAM: PORTABLE CHEST 1 VIEW COMPARISON:  Chest x-ray of July 12, 2017 FINDINGS: The lungs are adequately inflated. There is persistent increased density in the retrocardiac region on the left. The cardiac silhouette is enlarged. The central pulmonary vascularity is mildly prominent. There is mild overall interstitial prominence which is stable. There is calcification in the wall of the aortic arch. The dialysis catheter tip projects at the cavoatrial junction. IMPRESSION: COPD. Persistent left lower lobe atelectasis or pneumonia. Stable cardiomegaly with minimal pulmonary vascular congestion. Electronically Signed   By: David  Martinique M.D.   On: 07/14/2017 14:56   Ct Angio Abd/pel W/ And/or W/o  Result Date: 07/15/2017 CLINICAL DATA:  GI bleeding starting this morning. EXAM: CTA ABDOMEN AND PELVIS wITHOUT AND WITH CONTRAST TECHNIQUE: Multidetector CT imaging of the abdomen and pelvis was performed using the standard protocol during bolus administration of intravenous contrast. Multiplanar reconstructed images and MIPs were obtained and reviewed to evaluate the vascular anatomy. CONTRAST:  100 mL Isovue 370  COMPARISON:  04/22/2017 FINDINGS: VASCULAR Aorta: Prominent diffuse aortic calcification. No aneurysm or obstruction. No dissection. Celiac: Prominent calcification throughout the celiac axis. Celiac vessels remain patent. No definite stenosis. SMA: Diffuse calcification throughout the superior mesenteric artery. The vessel remains patent although there is evidence of at least moderate stenosis at the origin and just distal to the origin. Renals: Single renal arteries bilaterally demonstrates severe calcification. Changes highly likely to represent severe renal artery stenosis bilaterally. Diffuse bilateral renal atrophy. IMA: Inferior mesenteric artery is diminutive but appears patent. Inflow: Vessels remain patent with diffuse calcification. Probably least moderate stenosis is present. Proximal Outflow: Vessels remain patent with diffuse calcification. Probably areas of at least moderate stenosis are present. Veins: No obvious venous abnormality within the limitations of this arterial phase study. Portal veins and mesenteric veins appear patent without obvious thrombosis. Review of the MIP images confirms the above findings. NON-VASCULAR Lower chest: Small pericardial effusion.  Lung bases are clear. Hepatobiliary: No focal liver abnormality is seen. No gallstones, gallbladder wall thickening, or biliary dilatation. Pancreas: Unremarkable. No pancreatic ductal dilatation or surrounding  inflammatory changes. Spleen: Normal in size without focal abnormality. Adrenals/Urinary Tract: No adrenal gland nodules. Both kidneys are severely atrophic with probably delayed nephrograms. No hydronephrosis or hydroureter. Bladder is decompressed. Stomach/Bowel: Stomach, small bowel, and colon are not abnormally distended. Scattered stool throughout the colon. No specific wall thickening. Calcification in the wall of the stomach. Focal increased density in the stomach likely to represent ingested medication. This is stable on the  arterial and portal venous phases. The portal venous phase images demonstrate interval development of increased attenuation material within the rectum extending from the rectosigmoid junction into the low rectal region. This is consistent with active contrast extravasation due to active bleeding. Source appears to be at the level of the low rectosigmoid junction. No associated mass or wall thickening is appreciated. Lymphatic: No significant lymphadenopathy is identified. Reproductive: Prostate is unremarkable. Other: Severe diffuse free fluid throughout the abdomen and pelvis. Density measurements are consistent with ascites. No free air in the abdomen. Abdominal wall musculature appears intact. Musculoskeletal: Diffuse bone sclerosis may be due to renal osteodystrophy. No focal bone lesions or destruction identified. Degenerative changes mostly at the lumbosacral junction. IMPRESSION: VASCULAR Prominent diffuse atherosclerotic calcifications throughout the abdominal aorta and all major branch vessels. No aneurysm or dissection is identified. There are likely areas of at least moderate stenosis demonstrated in the proximal superior mesenteric artery and in the iliac, external iliac, and femoral arteries bilaterally. There is evidence of bilateral renal artery stenosis with severe bilateral renal atrophy and delayed nephrograms. NON-VASCULAR 1. Contrast extravasation consistent with active bleeding demonstrated in the rectum. No associated mass or wall thickening is appreciated. 2. Prominent diffuse abdominal and pelvic ascites. Small pericardial effusion. 3. Diffuse bone sclerosis likely representing renal osteodystrophy. These results were called by telephone at the time of interpretation on 07/15/2017 at 10:05 pm to Dr. Zenovia Jarred , who verbally acknowledged these results. Electronically Signed   By: Lucienne Capers M.D.   On: 07/15/2017 22:06    ASSESSMENT / PLAN: NEURO: No focal deficits  Not on  sedation   CARDIAC: Hemodynamically stable Moderate LVH and elevated PAP approx 95 on last ECHO in Nov 2018 H/o Afib  Not requiring vasopressors at this time hemodynamically stable Pericardial effusion on CTA chest Plan to transfuse PRBCs    PULMONARY: Acute hypoxic respiratory failure.due to AECOPD due to RSV +/- bacterial superinfection +/- volume overload Not intubated  Continue spiriva, symbicort, albuterol hfa prn, continue to wean O2 Continue mucinex Pt received HD  ID: RSV + RSV Pneumonitis prednsione taper    Endocrine: TSH H/o insulin dependence Cortiosl level  GI: CT Angio- Contrast extravasation consistent with active bleeding demonstrated in the rectum GI on Board-. Emergent Colonoscopy -. Taken to Endo After procedure -> ICU Transfuse Albumin, 2 uPRBCs Sent INR- pending Hep C , ? ESLD Start 3rd gen cephalosporin in setting of acute Bleed, and ascites MELD - pending INR   Heme: Active LGIB Hgb 5.5 Transfuse 2 units PRBCs Given h/o Hep C-> low Albumin and abdominopelvic ascites  ? Component of liver failure with ESRD Albumin 25% CBC Q 8 Coags pending ..O NEG Hold all antiplatelet and Anticoagulants in setting of acute bleed  RENAL ESRD- received scheduled HD  Renal osteodystrophy. Baseline Cr .. Lab Results  Component Value Date   CREATININE 4.06 (H) 07/15/2017   CREATININE 3.17 (H) 07/14/2017   CREATININE 2.19 (H) 07/13/2017      I, Dr Seward Carol have personally reviewed patient's available data, including medical history,  events of note, physical examination and test results as part of my evaluation. I have discussed with NP and E link physician other care providers such as pharmacist, RN and RRT. The patient is critically ill with multiple organ systems failure and requires high complexity decision making for assessment and support, frequent evaluation and titration of therapies, application of advanced monitoring technologies  and extensive interpretation of multiple databases. Critical Care Time devoted to patient care services described in this note is 32 Minutes. This time reflects time of care of this signee Dr Seward Carol. This critical care time does not reflect procedure time, or teaching time or supervisory time of NP but could involve care discussion time    DISPOSITION: ICU   CC TIME: 55 mins PROGNOSIS: Poor FAMILY: not at bedside CODE STATUS: Full   Signed Dr Seward Carol Pulmonary Critical Care Locums  07/15/2017, 10:34 PM

## 2017-07-16 ENCOUNTER — Encounter (HOSPITAL_COMMUNITY): Payer: Self-pay | Admitting: Gastroenterology

## 2017-07-16 DIAGNOSIS — R578 Other shock: Secondary | ICD-10-CM

## 2017-07-16 DIAGNOSIS — K922 Gastrointestinal hemorrhage, unspecified: Secondary | ICD-10-CM

## 2017-07-16 LAB — CBC
HCT: 12.1 % — ABNORMAL LOW (ref 39.0–52.0)
HCT: 17.1 % — ABNORMAL LOW (ref 39.0–52.0)
HCT: 22.5 % — ABNORMAL LOW (ref 39.0–52.0)
Hemoglobin: 4 g/dL — CL (ref 13.0–17.0)
Hemoglobin: 5.9 g/dL — CL (ref 13.0–17.0)
Hemoglobin: 7.6 g/dL — ABNORMAL LOW (ref 13.0–17.0)
MCH: 26 pg (ref 26.0–34.0)
MCH: 28.1 pg (ref 26.0–34.0)
MCH: 28.3 pg (ref 26.0–34.0)
MCHC: 33.1 g/dL (ref 30.0–36.0)
MCHC: 34.5 g/dL (ref 30.0–36.0)
MCHC: 33.8 g/dL (ref 30.0–36.0)
MCV: 78.6 fL (ref 78.0–100.0)
MCV: 81.4 fL (ref 78.0–100.0)
MCV: 83.6 fL (ref 78.0–100.0)
Platelets: 204 10*3/uL (ref 150–400)
Platelets: 126 10*3/uL — ABNORMAL LOW (ref 150–400)
Platelets: 131 10*3/uL — ABNORMAL LOW (ref 150–400)
RBC: 1.54 MIL/uL — ABNORMAL LOW (ref 4.22–5.81)
RBC: 2.1 MIL/uL — ABNORMAL LOW (ref 4.22–5.81)
RBC: 2.69 MIL/uL — ABNORMAL LOW (ref 4.22–5.81)
RDW: 20.7 % — ABNORMAL HIGH (ref 11.5–15.5)
RDW: 16.7 % — ABNORMAL HIGH (ref 11.5–15.5)
RDW: 16.2 % — ABNORMAL HIGH (ref 11.5–15.5)
WBC: 18.8 10*3/uL — ABNORMAL HIGH (ref 4.0–10.5)
WBC: 19 10*3/uL — ABNORMAL HIGH (ref 4.0–10.5)
WBC: 15.9 10*3/uL — ABNORMAL HIGH (ref 4.0–10.5)

## 2017-07-16 LAB — RENAL FUNCTION PANEL
Albumin: 2.4 g/dL — ABNORMAL LOW (ref 3.5–5.0)
Anion gap: 13 (ref 5–15)
BUN: 41 mg/dL — ABNORMAL HIGH (ref 6–20)
CO2: 27 mmol/L (ref 22–32)
Calcium: 8.9 mg/dL (ref 8.9–10.3)
Chloride: 99 mmol/L — ABNORMAL LOW (ref 101–111)
Creatinine, Ser: 5.85 mg/dL — ABNORMAL HIGH (ref 0.61–1.24)
GFR calc Af Amer: 11 mL/min — ABNORMAL LOW (ref >60)
GFR calc non Af Amer: 10 mL/min — ABNORMAL LOW (ref >60)
Glucose, Bld: 102 mg/dL — ABNORMAL HIGH (ref 65–99)
Phosphorus: 4 mg/dL (ref 2.5–4.6)
Potassium: 4 mmol/L (ref 3.5–5.1)
Sodium: 139 mmol/L (ref 135–145)

## 2017-07-16 LAB — GASTROINTESTINAL PANEL BY PCR, STOOL (REPLACES STOOL CULTURE)

## 2017-07-16 LAB — DIFFERENTIAL
Basophils Absolute: 0.1 10*3/uL (ref 0.0–0.1)
Basophils Relative: 0 %
Eosinophils Absolute: 0 10*3/uL (ref 0.0–0.7)
Eosinophils Relative: 0 %
Lymphocytes Relative: 19 %
Lymphs Abs: 3.6 10*3/uL (ref 0.7–4.0)
Monocytes Absolute: 1.7 10*3/uL — ABNORMAL HIGH (ref 0.1–1.0)
Monocytes Relative: 9 %
Neutro Abs: 13.6 10*3/uL — ABNORMAL HIGH (ref 1.7–7.7)
Neutrophils Relative %: 72 %

## 2017-07-16 LAB — GLUCOSE, CAPILLARY: Glucose-Capillary: 218 mg/dL — ABNORMAL HIGH (ref 65–99)

## 2017-07-16 LAB — CORTISOL: Cortisol, Plasma: 9.4 ug/dL

## 2017-07-16 LAB — PREPARE RBC (CROSSMATCH)

## 2017-07-16 LAB — LACTIC ACID, PLASMA: Lactic Acid, Venous: 6.2 mmol/L (ref 0.5–1.9)

## 2017-07-16 LAB — PROTIME-INR
INR: 1.58
Prothrombin Time: 18.7 seconds — ABNORMAL HIGH (ref 11.4–15.2)

## 2017-07-16 LAB — MAGNESIUM: Magnesium: 2 mg/dL (ref 1.7–2.4)

## 2017-07-16 MED ORDER — DIPHENHYDRAMINE HCL 25 MG PO CAPS: 50.0000 mg | ORAL_CAPSULE | Freq: Four times a day (QID) | ORAL | Status: DC | PRN

## 2017-07-16 MED ORDER — DIPHENHYDRAMINE HCL 50 MG/ML IJ SOLN
50.0000 mg | Freq: Four times a day (QID) | INTRAMUSCULAR | Status: DC | PRN
  Administered 2017-07-16: 50 mg via INTRAVENOUS
  Filled 2017-07-16: qty 1

## 2017-07-16 MED ORDER — DEXTROSE 5 % IV SOLN
2.0000 g | INTRAVENOUS | Status: DC
  Administered 2017-07-16: 2 g via INTRAVENOUS
  Filled 2017-07-16: qty 2

## 2017-07-16 MED ORDER — METOPROLOL TARTRATE 5 MG/5ML IV SOLN: 2.5000 mg | INTRAVENOUS | Status: DC | PRN

## 2017-07-16 MED ORDER — DOCUSATE SODIUM 100 MG PO CAPS
100.0000 mg | ORAL_CAPSULE | Freq: Two times a day (BID) | ORAL | Status: DC
  Filled 2017-07-16 (×4): qty 1

## 2017-07-16 MED ORDER — SODIUM CHLORIDE 0.9 % IV SOLN
INTRAVENOUS | Status: DC
  Administered 2017-07-16: 125 mL/h via INTRAVENOUS

## 2017-07-16 MED ORDER — SODIUM CHLORIDE 0.9 % IV SOLN
Freq: Once | INTRAVENOUS | Status: AC
Start: 2017-07-16 — End: 2017-07-16
  Administered 2017-07-16: 12:00:00 via INTRAVENOUS

## 2017-07-16 MED ORDER — PSYLLIUM 95 % PO PACK
1.0000 | PACK | Freq: Every day | ORAL | Status: DC
  Administered 2017-07-17: 1 via ORAL
  Filled 2017-07-16 (×3): qty 1

## 2017-07-16 MED ORDER — HYDROCORTISONE NA SUCCINATE PF 100 MG IJ SOLR
50.0000 mg | Freq: Three times a day (TID) | INTRAMUSCULAR | Status: DC
  Filled 2017-07-16: qty 1

## 2017-07-16 NOTE — Progress Notes (Signed)
Subjective: No complaints.  No reports of hematochezia overnight.  Objective: Vital signs in last 24 hours: Temp:  [94.1 F (34.5 C)-98 F (36.7 C)] 98 F (36.7 C) (01/05 0734) Pulse Rate:  [74-100] 85 (01/05 0730) Resp:  [10-27] 10 (01/05 0730) BP: (68-189)/(26-97) 130/67 (01/05 0730) SpO2:  [96 %-100 %] 97 % (01/05 0730) Weight:  [71.1 kg (156 lb 12 oz)] 71.1 kg (156 lb 12 oz) (01/05 0105) Last BM Date: 07/16/17  Intake/Output from previous day: 01/04 0701 - 01/05 0700 In: 601.3 [I.V.:51.3; Blood:550] Out: 0  Intake/Output this shift: No intake/output data recorded.  General appearance: alert and no distress GI: soft, non-tender; bowel sounds normal; no masses,  no organomegaly  Lab Results: Recent Labs    07/15/17 0035 07/15/17 1830 07/15/17 2014 07/16/17 0030  WBC 7.4 14.8*  --  18.8*  HGB 9.3* 7.1* 5.5* 4.0*  HCT 28.6* 21.0* 16.0* 12.1*  PLT 216 269  --  204   BMET Recent Labs    07/13/17 2138 07/14/17 0944 07/15/17 0035  NA 133* 133* 131*  K 3.0* 3.0* 3.4*  CL 95* 96* 93*  CO2 27 28 24   GLUCOSE 127* 118* 183*  BUN 5* 9 19  CREATININE 2.19* 3.17* 4.06*  CALCIUM 9.0 9.2 9.4   LFT Recent Labs    07/15/17 1830  PROT 6.1*  ALBUMIN 2.5*  AST 41  ALT 22  ALKPHOS 95  BILITOT 0.8  BILIDIR 0.2  IBILI 0.6   PT/INR Recent Labs    07/16/17 0047  LABPROT 18.7*  INR 1.58   Hepatitis Panel No results for input(s): HEPBSAG, HCVAB, HEPAIGM, HEPBIGM in the last 72 hours. C-Diff Recent Labs    07/15/17 1933  CDIFFTOX NEGATIVE   Fecal Lactopherrin No results for input(s): FECLLACTOFRN in the last 72 hours.  Studies/Results: Dg Chest Port 1 View  Result Date: 07/14/2017 CLINICAL DATA:  RSV infection. Cough, shortness of breath. History of COPD, end-stage renal disease, smoker EXAM: PORTABLE CHEST 1 VIEW COMPARISON:  Chest x-ray of July 12, 2017 FINDINGS: The lungs are adequately inflated. There is persistent increased density in the retrocardiac  region on the left. The cardiac silhouette is enlarged. The central pulmonary vascularity is mildly prominent. There is mild overall interstitial prominence which is stable. There is calcification in the wall of the aortic arch. The dialysis catheter tip projects at the cavoatrial junction. IMPRESSION: COPD. Persistent left lower lobe atelectasis or pneumonia. Stable cardiomegaly with minimal pulmonary vascular congestion. Electronically Signed   By: David  Martinique M.D.   On: 07/14/2017 14:56   Ct Angio Abd/pel W/ And/or W/o  Result Date: 07/15/2017 CLINICAL DATA:  GI bleeding starting this morning. EXAM: CTA ABDOMEN AND PELVIS wITHOUT AND WITH CONTRAST TECHNIQUE: Multidetector CT imaging of the abdomen and pelvis was performed using the standard protocol during bolus administration of intravenous contrast. Multiplanar reconstructed images and MIPs were obtained and reviewed to evaluate the vascular anatomy. CONTRAST:  100 mL Isovue 370 COMPARISON:  04/22/2017 FINDINGS: VASCULAR Aorta: Prominent diffuse aortic calcification. No aneurysm or obstruction. No dissection. Celiac: Prominent calcification throughout the celiac axis. Celiac vessels remain patent. No definite stenosis. SMA: Diffuse calcification throughout the superior mesenteric artery. The vessel remains patent although there is evidence of at least moderate stenosis at the origin and just distal to the origin. Renals: Single renal arteries bilaterally demonstrates severe calcification. Changes highly likely to represent severe renal artery stenosis bilaterally. Diffuse bilateral renal atrophy. IMA: Inferior mesenteric artery is diminutive but appears  patent. Inflow: Vessels remain patent with diffuse calcification. Probably least moderate stenosis is present. Proximal Outflow: Vessels remain patent with diffuse calcification. Probably areas of at least moderate stenosis are present. Veins: No obvious venous abnormality within the limitations of this  arterial phase study. Portal veins and mesenteric veins appear patent without obvious thrombosis. Review of the MIP images confirms the above findings. NON-VASCULAR Lower chest: Small pericardial effusion.  Lung bases are clear. Hepatobiliary: No focal liver abnormality is seen. No gallstones, gallbladder wall thickening, or biliary dilatation. Pancreas: Unremarkable. No pancreatic ductal dilatation or surrounding inflammatory changes. Spleen: Normal in size without focal abnormality. Adrenals/Urinary Tract: No adrenal gland nodules. Both kidneys are severely atrophic with probably delayed nephrograms. No hydronephrosis or hydroureter. Bladder is decompressed. Stomach/Bowel: Stomach, small bowel, and colon are not abnormally distended. Scattered stool throughout the colon. No specific wall thickening. Calcification in the wall of the stomach. Focal increased density in the stomach likely to represent ingested medication. This is stable on the arterial and portal venous phases. The portal venous phase images demonstrate interval development of increased attenuation material within the rectum extending from the rectosigmoid junction into the low rectal region. This is consistent with active contrast extravasation due to active bleeding. Source appears to be at the level of the low rectosigmoid junction. No associated mass or wall thickening is appreciated. Lymphatic: No significant lymphadenopathy is identified. Reproductive: Prostate is unremarkable. Other: Severe diffuse free fluid throughout the abdomen and pelvis. Density measurements are consistent with ascites. No free air in the abdomen. Abdominal wall musculature appears intact. Musculoskeletal: Diffuse bone sclerosis may be due to renal osteodystrophy. No focal bone lesions or destruction identified. Degenerative changes mostly at the lumbosacral junction. IMPRESSION: VASCULAR Prominent diffuse atherosclerotic calcifications throughout the abdominal aorta and  all major branch vessels. No aneurysm or dissection is identified. There are likely areas of at least moderate stenosis demonstrated in the proximal superior mesenteric artery and in the iliac, external iliac, and femoral arteries bilaterally. There is evidence of bilateral renal artery stenosis with severe bilateral renal atrophy and delayed nephrograms. NON-VASCULAR 1. Contrast extravasation consistent with active bleeding demonstrated in the rectum. No associated mass or wall thickening is appreciated. 2. Prominent diffuse abdominal and pelvic ascites. Small pericardial effusion. 3. Diffuse bone sclerosis likely representing renal osteodystrophy. These results were called by telephone at the time of interpretation on 07/15/2017 at 10:05 pm to Dr. Zenovia Jarred , who verbally acknowledged these results. Electronically Signed   By: Lucienne Capers M.D.   On: 07/15/2017 22:06    Medications:  Scheduled: . atorvastatin  20 mg Oral q1800  . bacitracin   Topical Daily  . Chlorhexidine Gluconate Cloth  6 each Topical Daily  . darbepoetin (ARANESP) injection - DIALYSIS  200 mcg Intravenous Q Fri-HD  . diltiazem  120 mg Oral Daily  . docusate sodium  100 mg Oral BID  . mometasone-formoterol  2 puff Inhalation BID  . multivitamin  1 tablet Oral QHS  . nicotine  14 mg Transdermal Daily  . pantoprazole (PROTONIX) IV  40 mg Intravenous Q12H  . predniSONE  50 mg Oral Q breakfast  . psyllium  1 packet Oral Daily  . tiotropium  18 mcg Inhalation Daily   Continuous: . sodium chloride 10 mL/hr (07/16/17 0042)  . sodium chloride    . sodium chloride 125 mL/hr (07/16/17 0645)  . cefTRIAXone (ROCEPHIN)  IV Stopped (07/16/17 0255)    Assessment/Plan: 1) Solitary Rectal Ulcer Syndrome. 2) Severe anemia.  The patient's bleeding arrested, however, he stopped bleeding shortly before the [place].  I am uncertain if the hemoclipping has truly helped versus holding his heparin.  Regardless, he is stable.  Blood  pressure is still low, but he does not have any complaints.  HGB was reported at 4.0 and he received 2 units of PRBC overnight.    Plan: 1) Continue supportive care. 2) Follow HGB. 3) Minimize anticoagulation. 4) Fiber for bulk and colace.  LOS: 12 days   Carmel Garfield D 07/16/2017, 7:50 AM

## 2017-07-16 NOTE — Progress Notes (Signed)
.. ..  Name: [name] MRN: [id number] DOB: [birth year]    ADMISSION DATE:  07/04/2017 CONSULTATION DATE:  07/15/17  REFERRING MD :  FIELDS MD   CHIEF COMPLAINT:  ACUTE LGIB  BRIEF PATIENT DESCRIPTION: 57 yr old male with PMHx of COPD, CAD, Substance abuse, HTN, ESRD on HD, Hep B& C, and Tubular adenoma was admitted by [facility] service on 12/24 for AECOPD required intubation was positive for RSV.  1/4 - reconsulted , tr back from 60M to 58M --He received scheduled HD and then began to have BRBPR with external hemorrhoids noted on exam. Pt was seen by Vascular and GI. Painless hematochezia continued for several hrs last Hgb noted to be 5.5. CTA showed an active bleed in the rectum.  PCCM consulted for mgmt of Acute Lower GI Bleed.   SIGNIFICANT EVENTS  1/4 Painless hematochezia >> flex sig >> solitary rectal ulcer - clipped  STUDIES:  Echo 06/01/17 >> mod LVH, severe MS, PAS 95 mmHg CT angio chest/abd/pelvis 07/08/17 >> atherosclerosis, b/l nodular clusters concerning for PNA, CM   CT ANGIO/ABD/PELVIS 1/4 >>likely areas of at least moderate stenosis demonstrated in the proximal superior mesenteric artery and in the iliac, external iliac, and femoral arteries bilaterally. There is evidence of bilateral renal artery stenosis with severe bilateral renal atrophy and delayed nephrograms.  Contrast extravasation consistent with active bleeding demonstrated in the rectum. No associated mass or wall thickening is appreciated.  Prominent diffuse abdominal and pelvic ascites. Small pericardial effusion.   SUBJECTIVE: asleep, denies pain afebrile  VITAL SIGNS: Temp:  [94.1 F (34.5 C)-98 F (36.7 C)] 98 F (36.7 C) (01/05 0734) Pulse Rate:  [74-100] 85 (01/05 0730) Resp:  [10-27] 10 (01/05 0730) BP: (68-189)/(26-97) 130/67 (01/05 0730) SpO2:  [96 %-100 %] 97 % (01/05 0730) Weight:  [156 lb 12 oz (71.1 kg)] 156 lb 12 oz (71.1 kg) (01/05 0105)  PHYSICAL EXAMINATION: General:  Not in  acute distress Neuro:  No focal deficits, intact CN exam HEENT:  NCAT  Cardiovascular:  Reg rate and rhythm S1 and S2, wide complex on monitor Lungs:  Clear to auscultation bilaterally no wheezing Abdomen:  BS + NT ND  Musculoskeletal:  No gross deformities Skin:  Grossly intact  Recent Labs  Lab 07/13/17 2138 07/14/17 0944 07/15/17 0035  NA 133* 133* 131*  K 3.0* 3.0* 3.4*  CL 95* 96* 93*  CO2 27 28 24   BUN 5* 9 19  CREATININE 2.19* 3.17* 4.06*  GLUCOSE 127* 118* 183*   Recent Labs  Lab 07/15/17 0035 07/15/17 1830 07/15/17 2014 07/16/17 0030  HGB 9.3* 7.1* 5.5* 4.0*  HCT 28.6* 21.0* 16.0* 12.1*  WBC 7.4 14.8*  --  18.8*  PLT 216 269  --  204   Dg Chest Port 1 View  Result Date: 07/14/2017 CLINICAL DATA:  RSV infection. Cough, shortness of breath. History of COPD, end-stage renal disease, smoker EXAM: PORTABLE CHEST 1 VIEW COMPARISON:  Chest x-ray of July 12, 2017 FINDINGS: The lungs are adequately inflated. There is persistent increased density in the retrocardiac region on the left. The cardiac silhouette is enlarged. The central pulmonary vascularity is mildly prominent. There is mild overall interstitial prominence which is stable. There is calcification in the wall of the aortic arch. The dialysis catheter tip projects at the cavoatrial junction. IMPRESSION: COPD. Persistent left lower lobe atelectasis or pneumonia. Stable cardiomegaly with minimal pulmonary vascular congestion. Electronically Signed   By: David  Martinique M.D.   On: 07/14/2017 14:56  Ct Angio Abd/pel W/ And/or W/o  Result Date: 07/15/2017 CLINICAL DATA:  GI bleeding starting this morning. EXAM: CTA ABDOMEN AND PELVIS wITHOUT AND WITH CONTRAST TECHNIQUE: Multidetector CT imaging of the abdomen and pelvis was performed using the standard protocol during bolus administration of intravenous contrast. Multiplanar reconstructed images and MIPs were obtained and reviewed to evaluate the vascular anatomy. CONTRAST:   100 mL Isovue 370 COMPARISON:  04/22/2017 FINDINGS: VASCULAR Aorta: Prominent diffuse aortic calcification. No aneurysm or obstruction. No dissection. Celiac: Prominent calcification throughout the celiac axis. Celiac vessels remain patent. No definite stenosis. SMA: Diffuse calcification throughout the superior mesenteric artery. The vessel remains patent although there is evidence of at least moderate stenosis at the origin and just distal to the origin. Renals: Single renal arteries bilaterally demonstrates severe calcification. Changes highly likely to represent severe renal artery stenosis bilaterally. Diffuse bilateral renal atrophy. IMA: Inferior mesenteric artery is diminutive but appears patent. Inflow: Vessels remain patent with diffuse calcification. Probably least moderate stenosis is present. Proximal Outflow: Vessels remain patent with diffuse calcification. Probably areas of at least moderate stenosis are present. Veins: No obvious venous abnormality within the limitations of this arterial phase study. Portal veins and mesenteric veins appear patent without obvious thrombosis. Review of the MIP images confirms the above findings. NON-VASCULAR Lower chest: Small pericardial effusion.  Lung bases are clear. Hepatobiliary: No focal liver abnormality is seen. No gallstones, gallbladder wall thickening, or biliary dilatation. Pancreas: Unremarkable. No pancreatic ductal dilatation or surrounding inflammatory changes. Spleen: Normal in size without focal abnormality. Adrenals/Urinary Tract: No adrenal gland nodules. Both kidneys are severely atrophic with probably delayed nephrograms. No hydronephrosis or hydroureter. Bladder is decompressed. Stomach/Bowel: Stomach, small bowel, and colon are not abnormally distended. Scattered stool throughout the colon. No specific wall thickening. Calcification in the wall of the stomach. Focal increased density in the stomach likely to represent ingested medication.  This is stable on the arterial and portal venous phases. The portal venous phase images demonstrate interval development of increased attenuation material within the rectum extending from the rectosigmoid junction into the low rectal region. This is consistent with active contrast extravasation due to active bleeding. Source appears to be at the level of the low rectosigmoid junction. No associated mass or wall thickening is appreciated. Lymphatic: No significant lymphadenopathy is identified. Reproductive: Prostate is unremarkable. Other: Severe diffuse free fluid throughout the abdomen and pelvis. Density measurements are consistent with ascites. No free air in the abdomen. Abdominal wall musculature appears intact. Musculoskeletal: Diffuse bone sclerosis may be due to renal osteodystrophy. No focal bone lesions or destruction identified. Degenerative changes mostly at the lumbosacral junction. IMPRESSION: VASCULAR Prominent diffuse atherosclerotic calcifications throughout the abdominal aorta and all major branch vessels. No aneurysm or dissection is identified. There are likely areas of at least moderate stenosis demonstrated in the proximal superior mesenteric artery and in the iliac, external iliac, and femoral arteries bilaterally. There is evidence of bilateral renal artery stenosis with severe bilateral renal atrophy and delayed nephrograms. NON-VASCULAR 1. Contrast extravasation consistent with active bleeding demonstrated in the rectum. No associated mass or wall thickening is appreciated. 2. Prominent diffuse abdominal and pelvic ascites. Small pericardial effusion. 3. Diffuse bone sclerosis likely representing renal osteodystrophy. These results were called by telephone at the time of interpretation on 07/15/2017 at 10:05 pm to Dr. Zenovia Jarred , who verbally acknowledged these results. Electronically Signed   By: Lucienne Capers M.D.   On: 07/15/2017 22:06    ASSESSMENT /  PLAN:  CARDIAC: Hemorrhagic shock Moderate LVH and elevated PAP approx 95 on last ECHO in Nov 2018 H/o Afib  Small Pericardial effusion on CTA chest  Dc IVFs now that BP improved   PULMONARY: Acute hypoxic respiratory failure.due to AECOPD due to RSV +/- bacterial superinfection +/- volume overload Not intubated  Continue spiriva, symbicort, albuterol hfa prn, continue to wean O2 Continue mucinex Dc steroids now that bspasm resovled  ID: RSV + RSV Pneumonitis Dc prednsione dc ceftx   Endocrine: H/o insulin dependence Cortisol level  Low 9  GI: Solitary rectal ulcer - clipped but had stopped bleeding, likely due to heparin Hep C cirrhosis + ascites  - npo   Heme: Active LGIB ..O NEG Hold all antiplatelet and Anticoagulants in setting of acute bleed  RENAL ESRD- received scheduled HD  Renal osteodystrophy.  -last HD 1/4   DISPOSITION: ICU   PROGNOSIS: guarded FAMILY: not at bedside CODE STATUS: Full  Tr to tele once Hb improved & no more bleeding To [facility] 1/6  My cc time x 69m  Kara Mead MD. FCCP. [facility] Pager [phone] If no response call [phone]     07/16/2017, 8:15 AM

## 2017-07-16 NOTE — Op Note (Signed)
[facility] Patient Name: [name] Procedure Date : 07/15/2017 MRN: [id number] Attending MD: Carol Ada , MD Date of Birth: Oct 10, 1960 CSN: [id number] Age: 57 Admit Type: Inpatient Procedure:                Flexible Sigmoidoscopy Indications:              Hematochezia Providers:                Carol Ada, MD, Cleda Daub, RN, Nevin Bloodgood, Technician Referring MD:              Medicines:                None Complications:            No immediate complications. Estimated Blood Loss:     Estimated blood loss: none. Procedure:                Pre-Anesthesia Assessment:                           - Prior to the procedure, a History and Physical                            was performed, and patient medications and                            allergies were reviewed. The patient's tolerance of                            previous anesthesia was also reviewed. The risks                            and benefits of the procedure and the sedation                            options and risks were discussed with the patient.                            All questions were answered, and informed consent                            was obtained. Prior Anticoagulants: The patient has                            taken heparin, last dose was day of procedure. ASA                            Grade Assessment: III - A patient with severe                            systemic disease. After reviewing the risks and                            benefits, the  patient was deemed in satisfactory                            condition to undergo the procedure.                           After obtaining informed consent, the scope was                            passed under direct vision. The [id number] [id number])                            scope was introduced through the anus and advanced                            to the the sigmoid colon. The flexible         sigmoidoscopy was accomplished without difficulty.                            The patient tolerated the procedure fairly well.                            The quality of the bowel preparation was excellent. Scope In: 11:51:36 PM Scope Out: 12:00:15 AM Total Procedure Duration: 0 hours 8 minutes 39 seconds  Findings:      Multiple fifteen mm ulcers were found in the rectum. No bleeding was       present. Stigmata of recent bleeding were present. For hemostasis, two       hemostatic clips were successfully placed (MR unsafe). There was no       bleeding at the end of the procedure.      Entry into the rectum revealed blood. Using a the double channel upper       endoscope the clot(s) were easily suctioned and excellent visualization       was achieved. The mucosa exhibited a 15 mm moderately cratered       ulceration with a potential visible vessel versus hemocystic spot at the       edge. There was also an adjacent smaller ulceration identified and it       was clean-based. The endoscope was advanced into the mid sigmoid colon       and there was a lack of blood in this area. The endoscope was withdrawn       into the rectum and two hemoclips were successfully secured across the       ulcer. The procedure was performed unsedated as the patient's SBP was at       75 mmHg. He was not able to hold the insufflation very well, but during       the times of adequate inflation another large ulceration region was       identified. This area was 180 degrees from the initial ulceration       identified and distal to the treated ulcer. This secondary ulcer was       larger and more diffuse. There was no overt vessel to suggest a source       of bleeding. The ulcerated pathology is consistent with Solitary Rectal  Ulcer Syndrome. He reports a history of constipation. Impression:               - Multiple ulcers in the rectum consistent with                            Solitary Rectal Ulcer  Syndrome. Clips (MR unsafe)                            were placed.                           - No specimens collected. Recommendation:           - Return patient to hospital ward for ongoing care.                           - NPO.                           - Continue to hold anticoagulation.                           - Stool softners and fiber for bulk.                           - Follow HGB and transfuse as necessary. Procedure Code(s):        --- Professional ---                           [phone], Sigmoidoscopy, flexible; with control of                            bleeding, any method Diagnosis Code(s):        --- Professional ---                           K62.6, Ulcer of anus and rectum                           K92.1, Melena (includes Hematochezia) CPT copyright 2016 [employer]. All rights reserved. The codes documented in this report are preliminary and upon coder review may  be revised to meet current compliance requirements. Carol Ada, MD Carol Ada, MD 07/16/2017 12:21:30 AM This report has been signed electronically. Number of Addenda: 0

## 2017-07-16 NOTE — Progress Notes (Signed)
CRITICAL VALUE ALERT  Critical Value:  Hgb 5.9  Date & Time Notied:  07/16/17 10:32 AM  Provider Notified: Dr. Elsworth Soho  Orders Received/Actions taken: 2 units PRBC ordered

## 2017-07-16 NOTE — Progress Notes (Signed)
CRITICAL VALUE ALERT  Critical Value:  Hb 4.0  Date & Time Notied:  07/16/2017  Provider Notified: Dr. Sheppard Coil  Orders Received/Actions taken: new order to infuse 2units PRBC's post type and cross

## 2017-07-16 NOTE — Progress Notes (Signed)
   VASCULAR SURGERY ASSESSMENT & PLAN:   He had problems with significant hematochezia yesterday.  He underwent sigmoidoscopy and was found to have a rectal ulcer which was clipped.  He has had no further bleeding.  His hemoglobin early this morning was 4.0.  He received 2 units of blood and his hemoglobin will be rechecked.  Continue dressing changes for split-thickness skin graft left arm.  Okay for discharge from our standpoint once his GI bleeding issue is resolved.  Appreciate assistance from Dr. Benson Norway and Dr. Chase Caller.   SUBJECTIVE:   No complaints  PHYSICAL EXAM:   Vitals:   07/16/17 0600 07/16/17 0630 07/16/17 0634 07/16/17 0635  BP: (!) 86/70 (!) 79/57 (!) 87/45 (!) 87/45  Pulse: 93 86 83 91  Resp: (!) 22 12 12 13   Temp: 97.9 F (36.6 C)     TempSrc: Oral     SpO2: 97% 96% 97% 97%  Weight:      Height:       The dressing on his left arm is dry.  LABS:   Lab Results  Component Value Date   WBC 18.8 (H) 07/16/2017   HGB 4.0 (LL) 07/16/2017   HCT 12.1 (L) 07/16/2017   MCV 78.6 07/16/2017   PLT 204 07/16/2017   Lab Results  Component Value Date   CREATININE 4.06 (H) 07/15/2017   Lab Results  Component Value Date   INR 1.58 07/16/2017   CBG (last 3)  Recent Labs    07/13/17 1112 07/13/17 1511 07/16/17 0021  GLUCAP 129* 157* 218*    PROBLEM LIST:    Principal Problem:   Hematochezia Active Problems:   Chronic hepatitis C without hepatic coma (HCC)   COPD (chronic obstructive pulmonary disease) (HCC)   Personality disorder (HCC)   CKD (chronic kidney disease) stage V requiring chronic dialysis ([place])   History of Cocaine abuse ([place])   Anemia due to chronic kidney disease   ESRD (end stage renal disease) (HCC)   CURRENT MEDS:   . atorvastatin  20 mg Oral q1800  . bacitracin   Topical Daily  . Chlorhexidine Gluconate Cloth  6 each Topical Daily  . darbepoetin (ARANESP) injection - DIALYSIS  200 mcg Intravenous Q Fri-HD  . diltiazem  120 mg  Oral Daily  . docusate sodium  100 mg Oral BID  . mometasone-formoterol  2 puff Inhalation BID  . multivitamin  1 tablet Oral QHS  . nicotine  14 mg Transdermal Daily  . pantoprazole (PROTONIX) IV  40 mg Intravenous Q12H  . predniSONE  50 mg Oral Q breakfast  . psyllium  1 packet Oral Daily  . tiotropium  18 mcg Inhalation Daily    [name] Beeper: [phone] Office: [phone] 07/16/2017

## 2017-07-16 NOTE — Progress Notes (Signed)
Received pt to 2M05 post endoscopy at 0100, transferred from 5M11, with Hb 4.0 post  Sigmoidoscopy.  Order to infuse 2units PRBC's post type and cross.  Will continue to monitor.

## 2017-07-16 NOTE — Progress Notes (Signed)
Assessment/Plan: 1. LUE skin necrosisafter AVF excision: S/p skin graft 07/04/17. VVS following. 2. Acute on Chronic respiratory failure:+RSV pneumonia. 3. A fib with RVR: On diltiazempercardiology. Anticoags on hold 4. ESRD: Req'dCVVHD 12/29/18thru1/1/19.Transitioned back to regular HD on 07/13/17, next HD 07/16/17. 5. Anemia:S/p multiple PRBCs 6. Hep B:continue with contact precautions 7. CAD:per Cardiology 10. Rectal ulcer bleed w/ acute LGI bleed  Subjective: Interval History: Wants to eat  Objective: Vital signs in last 24 hours: Temp:  [94.1 F (34.5 C)-98.1 F (36.7 C)] 98 F (36.7 C) (01/05 1400) Pulse Rate:  [74-102] 95 (01/05 1400) Resp:  [10-27] 20 (01/05 1400) BP: (68-165)/(26-97) 134/70 (01/05 1400) SpO2:  [96 %-100 %] 99 % (01/05 1400) Weight:  [71.1 kg (156 lb 12 oz)] 71.1 kg (156 lb 12 oz) (01/05 0105) Weight change: 0.5 kg (1 lb 1.6 oz)  Intake/Output from previous day: 01/04 0701 - 01/05 0700 In: 601.3 [I.V.:51.3; Blood:550] Out: 0  Intake/Output this shift: Total I/O In: 602 [I.V.:222; Blood:380] Out: -   General appearance: alert and cooperative Head: Normocephalic, without obvious abnormality, atraumatic Extremities: extremities normal, atraumatic, no cyanosis or edema  Lab Results: Recent Labs    07/16/17 0030 07/16/17 0924  WBC 18.8* 19.0*  HGB 4.0* 5.9*  HCT 12.1* 17.1*  PLT 204 126*   BMET:  Recent Labs    07/15/17 0035 07/16/17 0924  NA 131* 139  K 3.4* 4.0  CL 93* 99*  CO2 24 27  GLUCOSE 183* 102*  BUN 19 41*  CREATININE 4.06* 5.85*  CALCIUM 9.4 8.9   No results for input(s): PTH in the last 72 hours. Iron Studies: No results for input(s): IRON, TIBC, TRANSFERRIN, FERRITIN in the last 72 hours. Studies/Results: Ct Angio Abd/pel W/ And/or W/o  Result Date: 07/15/2017 CLINICAL DATA:  GI bleeding starting this morning. EXAM: CTA ABDOMEN AND PELVIS wITHOUT AND WITH CONTRAST TECHNIQUE: Multidetector CT imaging of the  abdomen and pelvis was performed using the standard protocol during bolus administration of intravenous contrast. Multiplanar reconstructed images and MIPs were obtained and reviewed to evaluate the vascular anatomy. CONTRAST:  100 mL Isovue 370 COMPARISON:  04/22/2017 FINDINGS: VASCULAR Aorta: Prominent diffuse aortic calcification. No aneurysm or obstruction. No dissection. Celiac: Prominent calcification throughout the celiac axis. Celiac vessels remain patent. No definite stenosis. SMA: Diffuse calcification throughout the superior mesenteric artery. The vessel remains patent although there is evidence of at least moderate stenosis at the origin and just distal to the origin. Renals: Single renal arteries bilaterally demonstrates severe calcification. Changes highly likely to represent severe renal artery stenosis bilaterally. Diffuse bilateral renal atrophy. IMA: Inferior mesenteric artery is diminutive but appears patent. Inflow: Vessels remain patent with diffuse calcification. Probably least moderate stenosis is present. Proximal Outflow: Vessels remain patent with diffuse calcification. Probably areas of at least moderate stenosis are present. Veins: No obvious venous abnormality within the limitations of this arterial phase study. Portal veins and mesenteric veins appear patent without obvious thrombosis. Review of the MIP images confirms the above findings. NON-VASCULAR Lower chest: Small pericardial effusion.  Lung bases are clear. Hepatobiliary: No focal liver abnormality is seen. No gallstones, gallbladder wall thickening, or biliary dilatation. Pancreas: Unremarkable. No pancreatic ductal dilatation or surrounding inflammatory changes. Spleen: Normal in size without focal abnormality. Adrenals/Urinary Tract: No adrenal gland nodules. Both kidneys are severely atrophic with probably delayed nephrograms. No hydronephrosis or hydroureter. Bladder is decompressed. Stomach/Bowel: Stomach, small bowel, and  colon are not abnormally distended. Scattered stool throughout the colon. No specific  wall thickening. Calcification in the wall of the stomach. Focal increased density in the stomach likely to represent ingested medication. This is stable on the arterial and portal venous phases. The portal venous phase images demonstrate interval development of increased attenuation material within the rectum extending from the rectosigmoid junction into the low rectal region. This is consistent with active contrast extravasation due to active bleeding. Source appears to be at the level of the low rectosigmoid junction. No associated mass or wall thickening is appreciated. Lymphatic: No significant lymphadenopathy is identified. Reproductive: Prostate is unremarkable. Other: Severe diffuse free fluid throughout the abdomen and pelvis. Density measurements are consistent with ascites. No free air in the abdomen. Abdominal wall musculature appears intact. Musculoskeletal: Diffuse bone sclerosis may be due to renal osteodystrophy. No focal bone lesions or destruction identified. Degenerative changes mostly at the lumbosacral junction. IMPRESSION: VASCULAR Prominent diffuse atherosclerotic calcifications throughout the abdominal aorta and all major branch vessels. No aneurysm or dissection is identified. There are likely areas of at least moderate stenosis demonstrated in the proximal superior mesenteric artery and in the iliac, external iliac, and femoral arteries bilaterally. There is evidence of bilateral renal artery stenosis with severe bilateral renal atrophy and delayed nephrograms. NON-VASCULAR 1. Contrast extravasation consistent with active bleeding demonstrated in the rectum. No associated mass or wall thickening is appreciated. 2. Prominent diffuse abdominal and pelvic ascites. Small pericardial effusion. 3. Diffuse bone sclerosis likely representing renal osteodystrophy. These results were called by telephone at the time  of interpretation on 07/15/2017 at 10:05 pm to Dr. Zenovia Jarred , who verbally acknowledged these results. Electronically Signed   By: Lucienne Capers M.D.   On: 07/15/2017 22:06    Scheduled: . atorvastatin  20 mg Oral q1800  . bacitracin   Topical Daily  . Chlorhexidine Gluconate Cloth  6 each Topical Daily  . darbepoetin (ARANESP) injection - DIALYSIS  200 mcg Intravenous Q Fri-HD  . diltiazem  120 mg Oral Daily  . docusate sodium  100 mg Oral BID  . mometasone-formoterol  2 puff Inhalation BID  . multivitamin  1 tablet Oral QHS  . nicotine  14 mg Transdermal Daily  . pantoprazole (PROTONIX) IV  40 mg Intravenous Q12H  . psyllium  1 packet Oral Daily  . tiotropium  18 mcg Inhalation Daily    LOS: 12 days   Estanislado Emms 07/16/2017,2:57 PM

## 2017-07-16 NOTE — Progress Notes (Signed)
Pharmacy Antibiotic Note  [name] is a 57 y.o. male admitted on 07/04/2017 with GI bleed.  Pharmacy has been consulted for Ceftriaxone dosing for intra-abdominal coverage. WBC increasing. ESRD on HD.   Plan: Ceftriaxone 2g IV q24h Trend WBC, temp F/U infectious work-up  Height: 5\' 9"  (175.3 cm) Weight: 155 lb 10.3 oz (70.6 kg) IBW/kg (Calculated) : 70.7  Temp (24hrs), Avg:98.1 F (36.7 C), Min:97.8 F (36.6 C), Max:98.5 F (36.9 C)  Recent Labs  Lab 07/09/17 0456  07/10/17 0404  07/11/17 0303  07/12/17 0400 07/13/17 0422 07/13/17 2138 07/14/17 0944 07/15/17 0035 07/15/17 1830 07/15/17 2014  WBC  --    < > 7.5  --  7.3  --  5.7  --   --   --  7.4 14.8*  --   CREATININE  --    < > 6.54*   < > 4.42*   < > 2.67* 3.45* 2.19* 3.17* 4.06*  --   --   LATICACIDVEN  --   --   --   --   --   --   --   --   --   --  2.1*  --  3.5*  VANCORANDOM 25  --   --   --   --   --   --   --   --   --   --   --   --    < > = values in this interval not displayed.    Estimated Creatinine Clearance: 20.8 mL/min (A) (by C-G formula based on SCr of 4.06 mg/dL (H)).    Allergies  Allergen Reactions  . Aspirin Other (See Comments)    STOMACH PAIN  . Clonidine Derivatives Itching  . Tramadol Itching    [name] 07/16/2017 12:34 AM

## 2017-07-17 LAB — CBC
HCT: 22.6 % — ABNORMAL LOW (ref 39.0–52.0)
HCT: 22.6 % — ABNORMAL LOW (ref 39.0–52.0)
Hemoglobin: 7.5 g/dL — ABNORMAL LOW (ref 13.0–17.0)
Hemoglobin: 7.9 g/dL — ABNORMAL LOW (ref 13.0–17.0)
MCH: 28.3 pg (ref 26.0–34.0)
MCH: 29.3 pg (ref 26.0–34.0)
MCHC: 33.2 g/dL (ref 30.0–36.0)
MCHC: 35 g/dL (ref 30.0–36.0)
MCV: 83.7 fL (ref 78.0–100.0)
MCV: 85.3 fL (ref 78.0–100.0)
Platelets: 122 10*3/uL — ABNORMAL LOW (ref 150–400)
Platelets: 149 10*3/uL — ABNORMAL LOW (ref 150–400)
RBC: 2.65 MIL/uL — ABNORMAL LOW (ref 4.22–5.81)
RBC: 2.7 MIL/uL — ABNORMAL LOW (ref 4.22–5.81)
RDW: 16.7 % — ABNORMAL HIGH (ref 11.5–15.5)
RDW: 16.9 % — ABNORMAL HIGH (ref 11.5–15.5)
WBC: 12.2 10*3/uL — ABNORMAL HIGH (ref 4.0–10.5)
WBC: 15.1 10*3/uL — ABNORMAL HIGH (ref 4.0–10.5)

## 2017-07-17 LAB — CBC WITH DIFFERENTIAL/PLATELET
Basophils Absolute: 0.1 10*3/uL (ref 0.0–0.1)
Basophils Relative: 1 %
Eosinophils Absolute: 0 10*3/uL (ref 0.0–0.7)
Eosinophils Relative: 0 %
HCT: 22.9 % — ABNORMAL LOW (ref 39.0–52.0)
Hemoglobin: 7.8 g/dL — ABNORMAL LOW (ref 13.0–17.0)
Lymphocytes Relative: 20 %
Lymphs Abs: 2.9 10*3/uL (ref 0.7–4.0)
MCH: 29.1 pg (ref 26.0–34.0)
MCHC: 34.1 g/dL (ref 30.0–36.0)
MCV: 85.4 fL (ref 78.0–100.0)
Monocytes Absolute: 1.6 10*3/uL — ABNORMAL HIGH (ref 0.1–1.0)
Monocytes Relative: 11 %
Neutro Abs: 10 10*3/uL — ABNORMAL HIGH (ref 1.7–7.7)
Neutrophils Relative %: 68 %
Platelets: 161 10*3/uL (ref 150–400)
RBC: 2.68 MIL/uL — ABNORMAL LOW (ref 4.22–5.81)
RDW: 16.8 % — ABNORMAL HIGH (ref 11.5–15.5)
WBC: 14.6 10*3/uL — ABNORMAL HIGH (ref 4.0–10.5)

## 2017-07-17 LAB — MAGNESIUM: Magnesium: 2 mg/dL (ref 1.7–2.4)

## 2017-07-17 LAB — RENAL FUNCTION PANEL
Albumin: 2.6 g/dL — ABNORMAL LOW (ref 3.5–5.0)
Anion gap: 11 (ref 5–15)
BUN: 25 mg/dL — ABNORMAL HIGH (ref 6–20)
CO2: 25 mmol/L (ref 22–32)
Calcium: 8.6 mg/dL — ABNORMAL LOW (ref 8.9–10.3)
Chloride: 92 mmol/L — ABNORMAL LOW (ref 101–111)
Creatinine, Ser: 4.22 mg/dL — ABNORMAL HIGH (ref 0.61–1.24)
GFR calc Af Amer: 17 mL/min — ABNORMAL LOW (ref >60)
GFR calc non Af Amer: 15 mL/min — ABNORMAL LOW (ref >60)
Glucose, Bld: 126 mg/dL — ABNORMAL HIGH (ref 65–99)
Phosphorus: 2.3 mg/dL — ABNORMAL LOW (ref 2.5–4.6)
Potassium: 3.2 mmol/L — ABNORMAL LOW (ref 3.5–5.1)
Sodium: 128 mmol/L — ABNORMAL LOW (ref 135–145)

## 2017-07-17 MED ORDER — NEPRO/CARBSTEADY PO LIQD
237.0000 mL | Freq: Three times a day (TID) | ORAL | Status: DC
  Administered 2017-07-17 – 2017-07-18 (×5): 237 mL via ORAL
  Filled 2017-07-17 (×9): qty 237

## 2017-07-17 MED ORDER — SODIUM CHLORIDE 0.9 % IV SOLN: Freq: Once | INTRAVENOUS | Status: DC

## 2017-07-17 MED ORDER — ALBUTEROL SULFATE (2.5 MG/3ML) 0.083% IN NEBU: 2.5000 mg | INHALATION_SOLUTION | RESPIRATORY_TRACT | Status: DC | PRN

## 2017-07-17 MED ORDER — SENNOSIDES-DOCUSATE SODIUM 8.6-50 MG PO TABS
1.0000 | ORAL_TABLET | Freq: Two times a day (BID) | ORAL | Status: DC
  Administered 2017-07-17 – 2017-07-18 (×2): 1 via ORAL
  Filled 2017-07-17 (×2): qty 1

## 2017-07-17 MED ORDER — POLYETHYLENE GLYCOL 3350 17 G PO PACK
17.0000 g | PACK | Freq: Two times a day (BID) | ORAL | Status: DC
  Administered 2017-07-17: 17 g via ORAL
  Filled 2017-07-17 (×2): qty 1

## 2017-07-17 NOTE — Progress Notes (Signed)
Patient refusing to be on monitor, getting up on his own without assistance.  Educated on importance of call bell and physicians orders for heart and oxygen monitoring.

## 2017-07-17 NOTE — Progress Notes (Signed)
Assessment/Plan: 1. LUE skin necrosisafter AVF excision: S/p skin graft 07/04/17. VVS following. 2. Acute on Chronic respiratory failure:+RSV pneumonia. 3. A fib with RVR: On diltiazempercardiology. Anticoags on hold 4. ESRD(MWF): Req'dCVVHD 12/29/18thru1/1/19.Transitioned back to regular HD on 07/13/17, next HD 07/18/17. 5. Anemia:S/p multiple PRBCs 6. Hep B:continue with contact precautions 7. CAD:per Cardiology 10. Rectal ulcer bleed w/ acute LGI bleed  Subjective: Interval History: No more bleeding  Objective: Vital signs in last 24 hours: Temp:  [97.9 F (36.6 C)-98.5 F (36.9 C)] 98.4 F (36.9 C) (01/06 0800) Pulse Rate:  [85-101] 101 (01/06 0813) Resp:  [13-24] 23 (01/06 0813) BP: (94-165)/(60-102) 139/63 (01/06 0746) SpO2:  [96 %-100 %] 100 % (01/05 2300) FiO2 (%):  [98 %] 98 % (01/06 0816) Weight:  [68.2 kg (150 lb 5.7 oz)-71.1 kg (156 lb 12 oz)] 68.7 kg (151 lb 7.3 oz) (01/06 0500) Weight change: 0 kg (0 lb)  Intake/Output from previous day: 01/05 0701 - 01/06 0700 In: 1199 [P.O.:237; I.V.:222; Blood:740] Out: 3000  Intake/Output this shift: Total I/O In: 587 [P.O.:587] Out: -    General appearance: unchanged  Lab Results: Recent Labs    07/17/17 0025 07/17/17 0647  WBC 12.2* 14.6*  HGB 7.9* 7.8*  HCT 22.6* 22.9*  PLT 122* 161   BMET:  Recent Labs    07/16/17 0924 07/17/17 0647  NA 139 128*  K 4.0 3.2*  CL 99* 92*  CO2 27 25  GLUCOSE 102* 126*  BUN 41* 25*  CREATININE 5.85* 4.22*  CALCIUM 8.9 8.6*   No results for input(s): PTH in the last 72 hours. Iron Studies: No results for input(s): IRON, TIBC, TRANSFERRIN, FERRITIN in the last 72 hours. Studies/Results: Ct Angio Abd/pel W/ And/or W/o  Result Date: 07/15/2017 CLINICAL DATA:  GI bleeding starting this morning. EXAM: CTA ABDOMEN AND PELVIS wITHOUT AND WITH CONTRAST TECHNIQUE: Multidetector CT imaging of the abdomen and pelvis was performed using the standard protocol during bolus  administration of intravenous contrast. Multiplanar reconstructed images and MIPs were obtained and reviewed to evaluate the vascular anatomy. CONTRAST:  100 mL Isovue 370 COMPARISON:  04/22/2017 FINDINGS: VASCULAR Aorta: Prominent diffuse aortic calcification. No aneurysm or obstruction. No dissection. Celiac: Prominent calcification throughout the celiac axis. Celiac vessels remain patent. No definite stenosis. SMA: Diffuse calcification throughout the superior mesenteric artery. The vessel remains patent although there is evidence of at least moderate stenosis at the origin and just distal to the origin. Renals: Single renal arteries bilaterally demonstrates severe calcification. Changes highly likely to represent severe renal artery stenosis bilaterally. Diffuse bilateral renal atrophy. IMA: Inferior mesenteric artery is diminutive but appears patent. Inflow: Vessels remain patent with diffuse calcification. Probably least moderate stenosis is present. Proximal Outflow: Vessels remain patent with diffuse calcification. Probably areas of at least moderate stenosis are present. Veins: No obvious venous abnormality within the limitations of this arterial phase study. Portal veins and mesenteric veins appear patent without obvious thrombosis. Review of the MIP images confirms the above findings. NON-VASCULAR Lower chest: Small pericardial effusion.  Lung bases are clear. Hepatobiliary: No focal liver abnormality is seen. No gallstones, gallbladder wall thickening, or biliary dilatation. Pancreas: Unremarkable. No pancreatic ductal dilatation or surrounding inflammatory changes. Spleen: Normal in size without focal abnormality. Adrenals/Urinary Tract: No adrenal gland nodules. Both kidneys are severely atrophic with probably delayed nephrograms. No hydronephrosis or hydroureter. Bladder is decompressed. Stomach/Bowel: Stomach, small bowel, and colon are not abnormally distended. Scattered stool throughout the colon.  No specific wall thickening. Calcification  in the wall of the stomach. Focal increased density in the stomach likely to represent ingested medication. This is stable on the arterial and portal venous phases. The portal venous phase images demonstrate interval development of increased attenuation material within the rectum extending from the rectosigmoid junction into the low rectal region. This is consistent with active contrast extravasation due to active bleeding. Source appears to be at the level of the low rectosigmoid junction. No associated mass or wall thickening is appreciated. Lymphatic: No significant lymphadenopathy is identified. Reproductive: Prostate is unremarkable. Other: Severe diffuse free fluid throughout the abdomen and pelvis. Density measurements are consistent with ascites. No free air in the abdomen. Abdominal wall musculature appears intact. Musculoskeletal: Diffuse bone sclerosis may be due to renal osteodystrophy. No focal bone lesions or destruction identified. Degenerative changes mostly at the lumbosacral junction. IMPRESSION: VASCULAR Prominent diffuse atherosclerotic calcifications throughout the abdominal aorta and all major branch vessels. No aneurysm or dissection is identified. There are likely areas of at least moderate stenosis demonstrated in the proximal superior mesenteric artery and in the iliac, external iliac, and femoral arteries bilaterally. There is evidence of bilateral renal artery stenosis with severe bilateral renal atrophy and delayed nephrograms. NON-VASCULAR 1. Contrast extravasation consistent with active bleeding demonstrated in the rectum. No associated mass or wall thickening is appreciated. 2. Prominent diffuse abdominal and pelvic ascites. Small pericardial effusion. 3. Diffuse bone sclerosis likely representing renal osteodystrophy. These results were called by telephone at the time of interpretation on 07/15/2017 at 10:05 pm to Dr. Zenovia Jarred , who verbally  acknowledged these results. Electronically Signed   By: Lucienne Capers M.D.   On: 07/15/2017 22:06    Scheduled: . atorvastatin  20 mg Oral q1800  . bacitracin   Topical Daily  . Chlorhexidine Gluconate Cloth  6 each Topical Daily  . darbepoetin (ARANESP) injection - DIALYSIS  200 mcg Intravenous Q Fri-HD  . diltiazem  120 mg Oral Daily  . feeding supplement (NEPRO CARB STEADY)  237 mL Oral TID BM  . mometasone-formoterol  2 puff Inhalation BID  . multivitamin  1 tablet Oral QHS  . nicotine  14 mg Transdermal Daily  . polyethylene glycol  17 g Oral BID  . psyllium  1 packet Oral Daily  . senna-docusate  1 tablet Oral BID  . tiotropium  18 mcg Inhalation Daily    LOS: 13 days   Estanislado Emms 07/17/2017,12:21 PM

## 2017-07-17 NOTE — Progress Notes (Signed)
Pt verbally aggressive with staff this am, refusing to have vital sign measurement and to leave autograft dressings in place.  Attempted to educate pt on the importance of infection prevention by leaving dressings in place, but pt resists redirection at this time.  Will continue to monitor.

## 2017-07-17 NOTE — Progress Notes (Signed)
   VASCULAR SURGERY ASSESSMENT & PLAN:   Split-thickness skin graft to left arm looks good.  The dressing on the donor site is dry.  No further bleeding after clipping of rectal ulcer.  Okay to move out of the unit if okay with GI.  Okay for discharge from vascular standpoint once the GI bleeding issue has resolved.  SUBJECTIVE:   No complaints this morning.  PHYSICAL EXAM:   Vitals:   07/17/17 0400 07/17/17 0500 07/17/17 0600 07/17/17 0700  BP:      Pulse:      Resp: 14 15 16  (!) 21  Temp:      TempSrc:      SpO2:      Weight:  151 lb 7.3 oz (68.7 kg)    Height:       I inspected his split-thickness skin graft to the left arm and this looks excellent. The donor site dressing is dry.  LABS:   Lab Results  Component Value Date   WBC 12.2 (H) 07/17/2017   HGB 7.9 (L) 07/17/2017   HCT 22.6 (L) 07/17/2017   MCV 83.7 07/17/2017   PLT 122 (L) 07/17/2017   Lab Results  Component Value Date   CREATININE 5.85 (H) 07/16/2017   Lab Results  Component Value Date   INR 1.58 07/16/2017   CBG (last 3)  Recent Labs    07/16/17 0021  GLUCAP 218*    PROBLEM LIST:    Principal Problem:   Hematochezia Active Problems:   Chronic hepatitis C without hepatic coma (HCC)   COPD (chronic obstructive pulmonary disease) (HCC)   Personality disorder (HCC)   CKD (chronic kidney disease) stage V requiring chronic dialysis ([place])   History of Cocaine abuse ([place])   Anemia due to chronic kidney disease   ESRD (end stage renal disease) (HCC)   CURRENT MEDS:   . atorvastatin  20 mg Oral q1800  . bacitracin   Topical Daily  . Chlorhexidine Gluconate Cloth  6 each Topical Daily  . darbepoetin (ARANESP) injection - DIALYSIS  200 mcg Intravenous Q Fri-HD  . diltiazem  120 mg Oral Daily  . docusate sodium  100 mg Oral BID  . feeding supplement (NEPRO CARB STEADY)  237 mL Oral TID BM  . mometasone-formoterol  2 puff Inhalation BID  . multivitamin  1 tablet Oral QHS  . nicotine   14 mg Transdermal Daily  . pantoprazole (PROTONIX) IV  40 mg Intravenous Q12H  . psyllium  1 packet Oral Daily  . tiotropium  18 mcg Inhalation Daily    [name] Beeper: [phone] Office: [phone] 07/17/2017

## 2017-07-17 NOTE — Progress Notes (Signed)
Dr. Lamonte Sakai made aware of pt's difficulty following plan of care, refusal of further blood administration if needed, in addition to demand for advancement of NPO status to consist of nepro carb steady.

## 2017-07-17 NOTE — Progress Notes (Signed)
eLink Physician-Brief Progress Note Patient Name: [name] DOB: Nov 21, 1960 MRN: [id number]   Date of Service  07/17/2017  HPI/Events of Note  Pt requesting PO diet. Reviewed GI Note, no apparent contraindication.   eICU Interventions  Clear diet and nepro ordered at his request     Intervention Category Major Interventions: Other:  Collene Gobble 07/17/2017, 12:37 AM

## 2017-07-17 NOTE — Progress Notes (Signed)
[facility] TEAM 1 - Stepdown/ICU TEAM  [name]  EXH:[id number] DOB: 1961/06/07 DOA: 07/04/2017 PCP: [name], MD    Brief Narrative:  57 yr old male with a Hx of COPD, CAD, Substance abuse, HTN, ESRD on HD, Hep B&C, and Tubular adenoma who was admitted by Vasc Surgery on 12/24 for tx of a L arm wound due to skin necrosis following revision of a HD AVG.  He developed fever and resp distress 12/28 while still hospitalized, and was found to be RSV+.  He required intubation and transfer to the [facility] service.  On 1/4 he received scheduled HD and then began to have BRBPR leading to hemorrhagic shock w/ Hgb nadir of 5.5. CTA showed an active bleed in the rectum.  Pt was returned to the ED, tranfused, and GI was consulted.  Ultimately he was found to have a bleeding rectal ulcer.  Significant Events: 12/24 Admit, split thickness skin graft Lt arm  12/28 VDRF - intubated  12/29 RSV+ 12/30 extubated  12/30 [facility] - Cardiology consulted 1/4 BRBPR - GI consulted   Subjective: Pt is very agitated and wants to go home.  He is refusing to cooperate w/ much of his prescribed care.  He will not wear his tele, and is asking to be d/c home.  He is hungry, and unhappy he is not yet on a diet.  He will not provide any further hx.    I have explained to him that I am not ready to d/c him until I see that his Hgb is truly stable, and that this will require at least one more night in the hospital.  He is informed he can leave AMA if he so chooses.    Assessment & Plan:  Acute blood loss anemia - Hemorrhagic shock - Solitary rectal ulcer - LGIB clipped but had stopped bleeding prior - likely due to heparin - follow Hgb trend  Acute hypoxic respiratory failure - AECOPD due to RSV Pneumonitis  Resolved w/ pt now on RA  Chronic Atrial fib Not previously on anticoag due to noncompliance - rate controlled   Split-thickness skin graft to left arm  Care as per Vascular Surgery   Small  Pericardial effusion Noted on CTA chest - clinically insignificant   Hyperglycemia No clear hx of DM - check A1c   Hep C cirrhosis + ascites  ESRD Nephrology following   Renal osteodystrophy  DVT prophylaxis: SCDs Code Status: FULL CODE Family Communication: no family present at time of exam  Disposition Plan: transfer to med bed (refusing to wear tele) - advance diet - follow Hgb tngt and in AM - if stable plan for d/c home 1/7  Consultants:  PCCM Vasc Surgery Nephrology GI  Antimicrobials:  Zosyn 12/28 > 1/1 Vancomycin 12/28 > 12/29 Rocephin 1/4  Objective: Blood pressure 139/63, pulse (!) 101, temperature 98.4 F (36.9 C), temperature source Oral, resp. rate (!) 23, height 5\' 9"  (1.753 m), weight 68.7 kg (151 lb 7.3 oz), SpO2 100 %.  Intake/Output Summary (Last 24 hours) at 07/17/2017 1000 Last data filed at 07/17/2017 0830 Gross per 24 hour  Intake 1328.17 ml  Output 3000 ml  Net -1671.83 ml   Filed Weights   07/16/17 1910 07/16/17 2310 07/17/17 0500  Weight: 71.1 kg (156 lb 12 oz) 68.2 kg (150 lb 5.7 oz) 68.7 kg (151 lb 7.3 oz)    Examination: General: No acute respiratory distress Lungs: Clear to auscultation bilaterally without wheezes or crackles Cardiovascular: irreg irreg -  no M or rub  Abdomen: Nontender, nondistended, soft, bowel sounds positive Extremities: No signif edema bilateral lower extremities  CBC: Recent Labs  Lab 07/15/17 0035  07/16/17 0030 07/16/17 0924 07/16/17 1900 07/17/17 0025 07/17/17 0647  WBC 7.4   < > 18.8* 19.0* 15.9* 12.2* 14.6*  NEUTROABS 5.9  --   --  13.6*  --   --  10.0*  HGB 9.3*   < > 4.0* 5.9* 7.6* 7.9* 7.8*  HCT 28.6*   < > 12.1* 17.1* 22.5* 22.6* 22.9*  MCV 77.9*   < > 78.6 81.4 83.6 83.7 85.4  PLT 216   < > 204 126* 131* 122* 161   < > = values in this interval not displayed.   Basic Metabolic Panel: Recent Labs  Lab 07/12/17 0400 07/13/17 0422 07/13/17 2138 07/14/17 0944 07/15/17 0035  07/16/17 0924 07/17/17 0647  NA 133* 131* 133* 133* 131* 139 128*  K 3.5 3.6 3.0* 3.0* 3.4* 4.0 3.2*  CL 96* 97* 95* 96* 93* 99* 92*  CO2 26 24 27 28 24 27 25   GLUCOSE 104* 113* 127* 118* 183* 102* 126*  BUN 14 15 5* 9 19 41* 25*  CREATININE 2.67* 3.45* 2.19* 3.17* 4.06* 5.85* 4.22*  CALCIUM 8.9 8.9 9.0 9.2 9.4 8.9 8.6*  MG 2.4 2.4  --  2.1 2.2 2.0  --   PHOS 2.2* 2.1* 1.8* 2.3* 2.5 4.0 2.3*   GFR: Estimated Creatinine Clearance: 19.4 mL/min (A) (by C-G formula based on SCr of 4.22 mg/dL (H)).  Liver Function Tests: Recent Labs  Lab 07/14/17 0944 07/15/17 0035 07/15/17 1830 07/16/17 0924 07/17/17 0647  AST  --   --  41  --   --   ALT  --   --  22  --   --   ALKPHOS  --   --  95  --   --   BILITOT  --   --  0.8  --   --   PROT  --   --  6.1*  --   --   ALBUMIN 2.6* 2.8* 2.5* 2.4* 2.6*    Coagulation Profile: Recent Labs  Lab 07/16/17 0047  INR 1.58    HbA1C: Hgb A1c MFr Bld  Date/Time Value Ref Range Status  04/24/2017 03:18 PM 4.6 (L) 4.8 - 5.6 % Final    Comment:    (NOTE) Pre diabetes:          5.7%-6.4% Diabetes:              >6.4% Glycemic control for   <7.0% adults with diabetes   08/17/2016 01:31 AM 4.9 4.8 - 5.6 % Final    Comment:    (NOTE)         Pre-diabetes: 5.7 - 6.4         Diabetes: >6.4         Glycemic control for adults with diabetes: <7.0     CBG: Recent Labs  Lab 07/12/17 1553 07/13/17 0740 07/13/17 1112 07/13/17 1511 07/16/17 0021  GLUCAP 114* 109* 129* 157* 218*    Recent Results (from the past 240 hour(s))  Culture, blood (Routine X 2) w Reflex to ID Panel     Status: None   Collection Time: 07/08/17  8:00 PM  Result Value Ref Range Status   Specimen Description BLOOD RIGHT ANTECUBITAL  Final   Special Requests   Final    BOTTLES DRAWN AEROBIC ONLY Blood Culture adequate volume IV SITE  Culture NO GROWTH 5 DAYS  Final   Report Status 07/13/2017 FINAL  Final  Culture, blood (Routine X 2) w Reflex to ID Panel      Status: None   Collection Time: 07/08/17  8:13 PM  Result Value Ref Range Status   Specimen Description BLOOD RIGHT HAND  Final   Special Requests   Final    BOTTLES DRAWN AEROBIC ONLY Blood Culture adequate volume   Culture NO GROWTH 5 DAYS  Final   Report Status 07/13/2017 FINAL  Final  Respiratory Panel by PCR     Status: Abnormal   Collection Time: 07/09/17  2:16 AM  Result Value Ref Range Status   Adenovirus NOT DETECTED NOT DETECTED Final   Coronavirus 229E NOT DETECTED NOT DETECTED Final   Coronavirus HKU1 NOT DETECTED NOT DETECTED Final   Coronavirus NL63 NOT DETECTED NOT DETECTED Final   Coronavirus OC43 NOT DETECTED NOT DETECTED Final   Metapneumovirus NOT DETECTED NOT DETECTED Final   Rhinovirus / Enterovirus NOT DETECTED NOT DETECTED Final   Influenza A NOT DETECTED NOT DETECTED Final   Influenza B NOT DETECTED NOT DETECTED Final   Parainfluenza Virus 1 NOT DETECTED NOT DETECTED Final   Parainfluenza Virus 2 NOT DETECTED NOT DETECTED Final   Parainfluenza Virus 3 NOT DETECTED NOT DETECTED Final   Parainfluenza Virus 4 NOT DETECTED NOT DETECTED Final   Respiratory Syncytial Virus DETECTED (A) NOT DETECTED Final    Comment: CRITICAL RESULT CALLED TO, READ BACK BY AND VERIFIED WITHArloa Koh RN [id number] 07/09/17 A BROWNING    Bordetella pertussis NOT DETECTED NOT DETECTED Final   Chlamydophila pneumoniae NOT DETECTED NOT DETECTED Final   Mycoplasma pneumoniae NOT DETECTED NOT DETECTED Final  C difficile quick scan w PCR reflex     Status: None   Collection Time: 07/15/17  7:33 PM  Result Value Ref Range Status   C Diff antigen NEGATIVE NEGATIVE Final   C Diff toxin NEGATIVE NEGATIVE Final   C Diff interpretation No C. difficile detected.  Final  Gastrointestinal Panel by PCR , Stool     Status: None   Collection Time: 07/15/17  7:33 PM  Result Value Ref Range Status   Campylobacter species NOT DETECTED NOT DETECTED Final   Plesimonas shigelloides NOT DETECTED NOT  DETECTED Final   Salmonella species NOT DETECTED NOT DETECTED Final   Yersinia enterocolitica NOT DETECTED NOT DETECTED Final   Vibrio species NOT DETECTED NOT DETECTED Final   Vibrio cholerae NOT DETECTED NOT DETECTED Final   Enteroaggregative E coli (EAEC) NOT DETECTED NOT DETECTED Final   Enteropathogenic E coli (EPEC) NOT DETECTED NOT DETECTED Final   Enterotoxigenic E coli (ETEC) NOT DETECTED NOT DETECTED Final   Shiga like toxin producing E coli (STEC) NOT DETECTED NOT DETECTED Final   Shigella/Enteroinvasive E coli (EIEC) NOT DETECTED NOT DETECTED Final   Cryptosporidium NOT DETECTED NOT DETECTED Final   Cyclospora cayetanensis NOT DETECTED NOT DETECTED Final   Entamoeba histolytica NOT DETECTED NOT DETECTED Final   Giardia lamblia NOT DETECTED NOT DETECTED Final   Adenovirus F40/41 NOT DETECTED NOT DETECTED Final   Astrovirus NOT DETECTED NOT DETECTED Final   Norovirus GI/GII NOT DETECTED NOT DETECTED Final   Rotavirus A NOT DETECTED NOT DETECTED Final   Sapovirus (I, II, IV, and V) NOT DETECTED NOT DETECTED Final    Comment: Performed at [facility], [address] [place], [place] [address]     Scheduled Meds: . atorvastatin  20 mg  Oral q1800  . bacitracin   Topical Daily  . Chlorhexidine Gluconate Cloth  6 each Topical Daily  . darbepoetin (ARANESP) injection - DIALYSIS  200 mcg Intravenous Q Fri-HD  . diltiazem  120 mg Oral Daily  . feeding supplement (NEPRO CARB STEADY)  237 mL Oral TID BM  . mometasone-formoterol  2 puff Inhalation BID  . multivitamin  1 tablet Oral QHS  . nicotine  14 mg Transdermal Daily  . polyethylene glycol  17 g Oral BID  . psyllium  1 packet Oral Daily  . senna-docusate  1 tablet Oral BID  . tiotropium  18 mcg Inhalation Daily     LOS: 13 days   Cherene Altes, MD [facility] Office  [phone] Pager - Text Page per Amion as per below:  On-Call/Text Page:      [email or web address]      password [email or web address]  If 7PM-7AM,  please contact night-coverage [email or web address] Password [email or web address] 07/17/2017, 10:00 AM

## 2017-07-18 DIAGNOSIS — K5901 Slow transit constipation: Secondary | ICD-10-CM

## 2017-07-18 DIAGNOSIS — K626 Ulcer of anus and rectum: Secondary | ICD-10-CM

## 2017-07-18 DIAGNOSIS — D62 Acute posthemorrhagic anemia: Secondary | ICD-10-CM

## 2017-07-18 DIAGNOSIS — D696 Thrombocytopenia, unspecified: Secondary | ICD-10-CM

## 2017-07-18 LAB — PREPARE RBC (CROSSMATCH)

## 2017-07-18 MED ORDER — DARBEPOETIN ALFA 200 MCG/0.4ML IJ SOSY: 200.0000 ug | PREFILLED_SYRINGE | INTRAMUSCULAR | Status: DC

## 2017-07-18 MED ORDER — SODIUM CHLORIDE 0.9 % IV SOLN: Freq: Once | INTRAVENOUS | Status: DC

## 2017-07-18 NOTE — Telephone Encounter (Signed)
Pt aware of rescue inhaler change per insurance.  See 12/27 phone note.  Nothing further needed.

## 2017-07-18 NOTE — Evaluation (Signed)
Occupational Therapy Evaluation and Discharge Patient Details Name: [name] MRN: [id number] DOB: [birth year] Today's Date: 07/18/2017    History of Present Illness pt is 57 yo male with PHMx:  COPD, CAD, substance abuse cocaine, HTN, ESRDon HD, hep B and hep C admitted on 06/30/17 for split thickness skin graft left arm with left thigh donor site with increased bleeding post op (healing nicely now); pt transferred to [facility] 07/08/17 with respiratory issues intubated and sedated, extubated 07/10/17. Was on CVVHD 12/29=18-07/12/2017. on 07/15/2017 pt with hematochezia s/p sigmoidoscopy with clipping of rectal ulcer on 07/16/2017   Clinical Impression   This 57 yo male admitted with above presents to acute OT at an overall S level and will have this during the day per his report from his sister (he does not feel he needs to stay with sister at night). No  further OT needs we will D/C.    Follow Up Recommendations  No OT follow up;Supervision/Assistance - 24 hour    Equipment Recommendations  None recommended by OT       Precautions / Restrictions Precautions Precautions: Fall Restrictions Weight Bearing Restrictions: No      Mobility Bed Mobility Overal bed mobility: Independent                Transfers Overall transfer level: Needs assistance Equipment used: None Transfers: Sit to/from Stand Sit to Stand: Supervision              Balance Overall balance assessment: Needs assistance Sitting-balance support: No upper extremity supported;Feet unsupported Sitting balance-Leahy Scale: Good     Standing balance support: Single extremity supported;During functional activity Standing balance-Leahy Scale: Poor Standing balance comment: uses SPC when out and about, but not for up to bathroom                           ADL either performed or assessed with clinical judgement   ADL Overall ADL's : Needs assistance/impaired Eating/Feeding:  Independent;Sitting   Grooming: Supervision/safety;Standing   Upper Body Bathing: Supervision/ safety;Sitting   Lower Body Bathing: Supervison/ safety;Sit to/from stand   Upper Body Dressing : Supervision/safety;Sitting   Lower Body Dressing: Supervision/safety;Sit to/from stand   Toilet Transfer: Supervision/safety;Ambulation;Comfort height toilet;Grab bars   Toileting- Clothing Manipulation and Hygiene: Supervision/safety;Sit to/from stand         General ADL Comments: Pt did request help for his left sock and his shoes due to pain, but feel based off of pt's flexibility he could do these if no one was around for him to ask     Vision Patient Visual Report: No change from baseline              Pertinent Vitals/Pain Pain Score: 8  Pain Location: LUE and LLE Pain Descriptors / Indicators: Sore Pain Intervention(s): Monitored during session     Hand Dominance  right   Extremity/Trunk Assessment Upper Extremity Assessment Upper Extremity Assessment: Overall WFL for tasks assessed LUE Deficits / Details: ace bandage on upper left arm   Lower Extremity Assessment Lower Extremity Assessment: LLE deficits/detail LLE Deficits / Details: skin graft on thigh       Communication  no issues   Cognition Arousal/Alertness: Awake/alert Behavior During Therapy: WFL for tasks assessed/performed Overall Cognitive Status: Within Functional Limits for tasks assessed  General Comments: Of note when entering room pt was removing curlex from LUE. He allowed me to re-wrap what he had unwrapped and put a piece of tape over the end of it, but would not let me put ace wrap around it.              Home Living Family/patient expects to be discharged to:: Private residence Living Arrangements: Other relatives(sistyers) Available Help at Discharge: Family;Available 24 hours/day Type of Home: House Home Access: Stairs to enter [employer] of Steps: 1         Bathroom Shower/Tub: Walk-in shower         Home Equipment: Kasandra Knudsen - single point   Additional Comments: reports he will go to sister's house during the day and his house at night               OT Problem List: Pain;Impaired balance (sitting and/or standing)      OT Treatment/Interventions:      OT Goals(Current goals can be found in the care plan section) Acute Rehab OT Goals Patient Stated Goal: to go home  OT Frequency: Min 2X/week   Barriers to D/C:    pt states he can stay at sister's house during day and his house at night       Co-evaluation PT/OT/SLP Co-Evaluation/Treatment: Yes(partial) Reason for Co-Treatment: To address functional/ADL transfers PT goals addressed during session: Mobility/safety with mobility;Balance;Strengthening/ROM OT goals addressed during session: Strengthening/ROM;ADL's and self-care      AM-PAC PT "6 Clicks" Daily Activity     Outcome Measure Help from another person eating meals?: None Help from another person taking care of personal grooming?: None Help from another person toileting, which includes using toliet, bedpan, or urinal?: None Help from another person bathing (including washing, rinsing, drying)?: None Help from another person to put on and taking off regular upper body clothing?: None Help from another person to put on and taking off regular lower body clothing?: None 6 Click Score: 24   End of Session Equipment Utilized During Treatment: [facility]) Nurse Communication: (pt asking about cream for his left leg)  Activity Tolerance: Patient tolerated treatment well Patient left: in chair  OT Visit Diagnosis: Unsteadiness on feet (R26.81);Pain Pain - Right/Left: Left Pain - part of body: Leg;Arm                Time: 1657-9038 OT Time Calculation (min): 15 min Charges:  OT General Charges $OT Visit: 1 Visit OT Evaluation $OT Eval Moderate Complexity: [address]  [place] [phone] 07/18/2017

## 2017-07-18 NOTE — Progress Notes (Signed)
Went in to give pt medications and assist with bandaging arm.  Pt asked for some "ointment" before he "got out of here."   RN asked where pt was going and pt stated he was not staying in the hospital any longer.  RN informed pt that he was to go to dialysis today and that he also needed another blood transfusion.  Pt stated, " I'm not doing dialysis here."  RN informed pt again that he also needed a blood transfusion and pt stated, " I'm not doing anything else here.  I am not going to dialysis and I am not getting a blood transfusion."  RN explained to pt that it was not advisable for him to leave and that if he wanted to leave he would need to sign an AMA form.  Pt stated, "I will sign the papers."  RN notified MD.  RN removed peripheral IV's, pt signed AMA form, and pt ambulated off the unit with a cane and stated his ride was downstairs.  Eliezer Bottom [place]

## 2017-07-18 NOTE — Progress Notes (Signed)
Daily Rounding Note  07/18/2017, 8:36 AM  LOS: 14 days   SUBJECTIVE:   Chief complaint: Rectal bleeding, has stopped.  Stools brown in last 24 hours Asking for topical ointment for left thigh skin graft site where skin is drying and cracking.   Eating well.    OBJECTIVE:         Vital signs in last 24 hours:    Temp:  [98.4 F (36.9 C)-98.5 F (36.9 C)] 98.4 F (36.9 C) (01/07 0540) Pulse Rate:  [84-101] 84 (01/07 0540) Resp:  [15] 15 (01/07 0540) BP: (144-179)/(72-84) 148/72 (01/07 0600) SpO2:  [95 %-99 %] 95 % (01/07 0540) Weight:  [69.3 kg (152 lb 12.5 oz)-72.3 kg (159 lb 6.3 oz)] 72.3 kg (159 lb 6.3 oz) (01/07 0540) Last BM Date: 07/17/17 Filed Weights   07/17/17 0500 07/17/17 1401 07/18/17 0540  Weight: 68.7 kg (151 lb 7.3 oz) 69.3 kg (152 lb 12.5 oz) 72.3 kg (159 lb 6.3 oz)   General: chronically ill looking   Heart: irreg, irreg, not tachy.   Chest: diminished sounds on right, no crackles, rales or ronchi Abdomen: soft, NT, ND  Extremities: no CCE Derm: graft harvest site on left thigh red, with cracking from dryness.   Neuro/Psych:  Oriented x 3.  Poor recall of meds he takes.    Intake/Output from previous day: 01/06 0701 - 01/07 0700 In: 907 [P.O.:907] Out: -   Intake/Output this shift: No intake/output data recorded.  Lab Results: Recent Labs    07/17/17 0025 07/17/17 0647 07/17/17 1930  WBC 12.2* 14.6* 15.1*  HGB 7.9* 7.8* 7.5*  HCT 22.6* 22.9* 22.6*  PLT 122* 161 149*   BMET Recent Labs    07/16/17 0924 07/17/17 0647  NA 139 128*  K 4.0 3.2*  CL 99* 92*  CO2 27 25  GLUCOSE 102* 126*  BUN 41* 25*  CREATININE 5.85* 4.22*  CALCIUM 8.9 8.6*   LFT Recent Labs    07/15/17 1830 07/16/17 0924 07/17/17 0647  PROT 6.1*  --   --   ALBUMIN 2.5* 2.4* 2.6*  AST 41  --   --   ALT 22  --   --   ALKPHOS 95  --   --   BILITOT 0.8  --   --   BILIDIR 0.2  --   --   IBILI 0.6  --   --     PT/INR Recent Labs    07/16/17 0047  LABPROT 18.7*  INR 1.58   Hepatitis Panel No results for input(s): HEPBSAG, HCVAB, HEPAIGM, HEPBIGM in the last 72 hours.  Studies/Results: No results found.   Scheduled Meds: . atorvastatin  20 mg Oral q1800  . bacitracin   Topical Daily  . Chlorhexidine Gluconate Cloth  6 each Topical Daily  . darbepoetin (ARANESP) injection - DIALYSIS  200 mcg Intravenous Q Fri-HD  . diltiazem  120 mg Oral Daily  . feeding supplement (NEPRO CARB STEADY)  237 mL Oral TID BM  . mometasone-formoterol  2 puff Inhalation BID  . multivitamin  1 tablet Oral QHS  . nicotine  14 mg Transdermal Daily  . polyethylene glycol  17 g Oral BID  . psyllium  1 packet Oral Daily  . senna-docusate  1 tablet Oral BID  . tiotropium  18 mcg Inhalation Daily   Continuous Infusions: . sodium chloride     PRN Meds:.albuterol, alteplase, diphenhydrAMINE, HYDROmorphone (DILAUDID) injection, ondansetron, simethicone, sodium chloride  flush, zolpidem   ASSESMENT:   *  Painless bleeding rectal ulcer, s/p 1/5 Flex sig and endoclipping x 2.    *  Chronic Plavix on hold since 1/4 (placed on hold early 06/2017 when held due to AV fistula bleeding and eventually restarted) as well as SQ heparin on hold.      *  Acute blood loss on top of chronic anemia.  S/p PRBC x 2.  Hgb stable.    On Aranesp (Mircera as outpt q 2 weeks).    *  RSV infection with vent requiring resp failure.     *  ESRD.    *  Thrombocytopenia, non-critical.  Chronic, intermittent issue.    *  Abdominal, pelvic ascites.  Liver, biliary tree normal, patent splenic/portal veins on CT.   11/2016 ultrasound with elastography: liver normal, Metavir score: F3, F4, high risk of fibrosis.     *  HCV: Treated with Mayvret, HCV undetectable 04/23/17.   Hep B detectable but DNA <20 and no indication for treatment per Dr Linus Salmons 03/01/17.  .     *  Hx Afib with RVR.  No AC PTA due to hx non-compliance with follow up.     *  2014 HP and adenomatous polyps.  09/2017 Colonoscopy recommended.      *  S/p skin graft for infection following revision of HD AV graft.     PLAN   *  Ok to resume plavix now.    [name]  07/18/2017, 8:36 AM Pager: [phone]  GI ATTENDING  Case discussed with Dr. Benson Norway. Interval history data reviewed. Agree with above progress note. No further bleeding. The patient will stay on bowel regimen to avoid recurrent problems with impaction and rectal ulcer formation. He is for discharge. Will sign off.  Docia Chuck. Geri Seminole., M.D. [facility] Division of Gastroenterology

## 2017-07-18 NOTE — Evaluation (Signed)
Physical Therapy Evaluation and Discharge Patient Details Name: [name] MRN: [id number] DOB: 1960/08/27 Today's Date: 07/18/2017   History of Present Illness  pt is 57 yo male with PHMx:  COPD, CAD, substance abuse cocaine, HTN, ESRDon HD, hep B and hep C admitted on 06/30/17 for split thickness skin graft left arm with left thigh donor site with increased bleeding post op (healing nicely now); pt transferred to [facility] 07/08/17 with respiratory issues intubated and sedated, extubated 07/10/17. Was on CVVHD 12/29=18-07/12/2017. on 07/15/2017 pt with hematochezia s/p sigmoidoscopy with clipping of rectal ulcer on 07/16/2017  Clinical Impression   Patient evaluated by Physical Therapy with no further acute PT needs identified, as he left AMA by the time this note was written;  All education has been completed and the patient has no further questions. He plans to stay with his sister during the day and at his own place at night;  See below for any follow-up Physical Therapy or equipment needs. PT is signing off. Thank you for this referral.     Follow Up Recommendations Other (comment)(HHRN for dressing changes)    Equipment Recommendations  None recommended by PT    Recommendations for Other Services       Precautions / Restrictions Precautions Precautions: Fall Restrictions Weight Bearing Restrictions: No      Mobility  Bed Mobility Overal bed mobility: Independent                Transfers Overall transfer level: Needs assistance Equipment used: None Transfers: Sit to/from Stand Sit to Stand: Supervision            Ambulation/Gait Ambulation/Gait assistance: Min guard;Supervision Ambulation Distance (Feet): 250 Feet Assistive device: None;Straight cane Gait Pattern/deviations: Step-through pattern;Drifts right/left Gait velocity: Decreased   General Gait Details: Correct use of cane; occasional R and L drift, but no overt loss of balance  Stairs             Wheelchair Mobility    Modified Rankin (Stroke Patients Only)       Balance Overall balance assessment: Needs assistance Sitting-balance support: No upper extremity supported;Feet unsupported Sitting balance-Leahy Scale: Good     Standing balance support: Single extremity supported;During functional activity Standing balance-Leahy Scale: Poor Standing balance comment: uses SPC when out and about, but not for up to bathroom                             Pertinent Vitals/Pain Pain Assessment: 0-10 Pain Score: 8  Pain Location: LUE and LLE Pain Descriptors / Indicators: Sore Pain Intervention(s): Monitored during session;Other (comment)(pt requesting ointment; RN notified)    Home Living Family/patient expects to be discharged to:: Private residence Living Arrangements: Other relatives(sistyers) Available Help at Discharge: Family;Available 24 hours/day Type of Home: House Home Access: Stairs to enter   [employer] of Steps: 1 Home Layout: One level Home Equipment: Cane - single point Additional Comments: reports he will go to sister's house during the day and his house at night    Prior Function Level of Independence: Independent with assistive device(s)         Comments: cane for mobility     Hand Dominance   Dominant Hand: Right    Extremity/Trunk Assessment   Upper Extremity Assessment Upper Extremity Assessment: Defer to OT evaluation LUE Deficits / Details: ace bandage on upper left arm    Lower Extremity Assessment Lower Extremity Assessment: Overall WFL for tasks assessed  LLE Deficits / Details: skin graft on thigh       Communication   Communication: No difficulties  Cognition Arousal/Alertness: Awake/alert Behavior During Therapy: WFL for tasks assessed/performed Overall Cognitive Status: Within Functional Limits for tasks assessed                                 General Comments: Of note when  entering room pt was removing curlex from LUE. He allowed me to re-wrap what he had unwrapped and put a piece of tape over the end of it, but would not let me put ace wrap around it.      General Comments      Exercises     Assessment/Plan    PT Assessment Patent does not need any further PT services  PT Problem List Decreased balance;Decreased activity tolerance       PT Treatment Interventions      PT Goals (Current goals can be found in the Care Plan section)  Acute Rehab PT Goals Patient Stated Goal: to go home PT Goal Formulation: All assessment and education complete, DC therapy    Frequency     Barriers to discharge        Co-evaluation PT/OT/SLP Co-Evaluation/Treatment: Yes Reason for Co-Treatment: To address functional/ADL transfers PT goals addressed during session: Mobility/safety with mobility         AM-PAC PT "6 Clicks" Daily Activity  Outcome Measure Difficulty turning over in bed (including adjusting bedclothes, sheets and blankets)?: None Difficulty moving from lying on back to sitting on the side of the bed? : None Difficulty sitting down on and standing up from a chair with arms (e.g., wheelchair, bedside commode, etc,.)?: None Help needed moving to and from a bed to chair (including a wheelchair)?: None Help needed walking in hospital room?: None Help needed climbing 3-5 steps with a railing? : None 6 Click Score: 24    End of Session   Activity Tolerance: Patient tolerated treatment well Patient left: in chair;with call bell/phone within reach Nurse Communication: Mobility status;Other (comment)(pt requesting ointment) PT Visit Diagnosis: Unsteadiness on feet (R26.81)    Time: 8144-8185 PT Time Calculation (min) (ACUTE ONLY): 24 min   Charges:   PT Evaluation $PT Eval Moderate Complexity: 1 Mod     PT G Codes:        Roney Marion, PT  Acute Rehabilitation Services Pager [phone] Office [phone]   [place] 07/18/2017, 12:55 PM

## 2017-07-20 DIAGNOSIS — D631 Anemia in chronic kidney disease: Secondary | ICD-10-CM | POA: Diagnosis not present

## 2017-07-20 DIAGNOSIS — N2581 Secondary hyperparathyroidism of renal origin: Secondary | ICD-10-CM | POA: Diagnosis not present

## 2017-07-20 DIAGNOSIS — N186 End stage renal disease: Secondary | ICD-10-CM | POA: Diagnosis not present

## 2017-07-20 DIAGNOSIS — D509 Iron deficiency anemia, unspecified: Secondary | ICD-10-CM | POA: Diagnosis not present

## 2017-07-20 LAB — TYPE AND SCREEN
ABO/RH(D): O NEG
Antibody Screen: NEGATIVE
Unit division: 0
Unit division: 0
Unit division: 0
Unit division: 0
Unit division: 0

## 2017-07-20 LAB — BPAM RBC
Blood Product Expiration Date: 201901182359
Blood Product Expiration Date: 201901212359
Blood Product Expiration Date: 201901262359
Blood Product Expiration Date: 201901262359
Blood Product Expiration Date: 201901272359
ISSUE DATE / TIME: 201901050143
ISSUE DATE / TIME: 201901050408
ISSUE DATE / TIME: 201901051132
ISSUE DATE / TIME: 201901051345
Unit Type and Rh: 9500
Unit Type and Rh: 9500
Unit Type and Rh: 9500
Unit Type and Rh: 9500
Unit Type and Rh: 9500

## 2017-07-21 ENCOUNTER — Ambulatory Visit: Payer: Medicare Other | Admitting: Vascular Surgery

## 2017-07-21 DIAGNOSIS — Z992 Dependence on renal dialysis: Secondary | ICD-10-CM | POA: Diagnosis not present

## 2017-07-21 DIAGNOSIS — N186 End stage renal disease: Secondary | ICD-10-CM | POA: Diagnosis not present

## 2017-07-21 DIAGNOSIS — I120 Hypertensive chronic kidney disease with stage 5 chronic kidney disease or end stage renal disease: Secondary | ICD-10-CM | POA: Diagnosis not present

## 2017-07-21 DIAGNOSIS — T82510D Breakdown (mechanical) of surgically created arteriovenous fistula, subsequent encounter: Secondary | ICD-10-CM | POA: Diagnosis not present

## 2017-07-21 DIAGNOSIS — D631 Anemia in chronic kidney disease: Secondary | ICD-10-CM | POA: Diagnosis not present

## 2017-07-21 DIAGNOSIS — J441 Chronic obstructive pulmonary disease with (acute) exacerbation: Secondary | ICD-10-CM | POA: Diagnosis not present

## 2017-07-22 DIAGNOSIS — D509 Iron deficiency anemia, unspecified: Secondary | ICD-10-CM | POA: Diagnosis not present

## 2017-07-22 DIAGNOSIS — N2581 Secondary hyperparathyroidism of renal origin: Secondary | ICD-10-CM | POA: Diagnosis not present

## 2017-07-22 DIAGNOSIS — D631 Anemia in chronic kidney disease: Secondary | ICD-10-CM | POA: Diagnosis not present

## 2017-07-22 DIAGNOSIS — N186 End stage renal disease: Secondary | ICD-10-CM | POA: Diagnosis not present

## 2017-07-25 DIAGNOSIS — D509 Iron deficiency anemia, unspecified: Secondary | ICD-10-CM | POA: Diagnosis not present

## 2017-07-25 DIAGNOSIS — I120 Hypertensive chronic kidney disease with stage 5 chronic kidney disease or end stage renal disease: Secondary | ICD-10-CM | POA: Diagnosis not present

## 2017-07-25 DIAGNOSIS — N186 End stage renal disease: Secondary | ICD-10-CM | POA: Diagnosis not present

## 2017-07-25 DIAGNOSIS — D631 Anemia in chronic kidney disease: Secondary | ICD-10-CM | POA: Diagnosis not present

## 2017-07-25 DIAGNOSIS — T82510D Breakdown (mechanical) of surgically created arteriovenous fistula, subsequent encounter: Secondary | ICD-10-CM | POA: Diagnosis not present

## 2017-07-25 DIAGNOSIS — G8929 Other chronic pain: Secondary | ICD-10-CM | POA: Diagnosis not present

## 2017-07-25 DIAGNOSIS — J441 Chronic obstructive pulmonary disease with (acute) exacerbation: Secondary | ICD-10-CM | POA: Diagnosis not present

## 2017-07-25 DIAGNOSIS — M79602 Pain in left arm: Secondary | ICD-10-CM | POA: Diagnosis not present

## 2017-07-25 DIAGNOSIS — Z992 Dependence on renal dialysis: Secondary | ICD-10-CM | POA: Diagnosis not present

## 2017-07-25 DIAGNOSIS — N2581 Secondary hyperparathyroidism of renal origin: Secondary | ICD-10-CM | POA: Diagnosis not present

## 2017-07-26 ENCOUNTER — Inpatient Hospital Stay: Payer: Self-pay | Admitting: Adult Health

## 2017-07-26 DIAGNOSIS — R069 Unspecified abnormalities of breathing: Secondary | ICD-10-CM | POA: Diagnosis not present

## 2017-07-26 DIAGNOSIS — H2513 Age-related nuclear cataract, bilateral: Secondary | ICD-10-CM | POA: Diagnosis not present

## 2017-07-26 DIAGNOSIS — J441 Chronic obstructive pulmonary disease with (acute) exacerbation: Secondary | ICD-10-CM | POA: Diagnosis not present

## 2017-07-26 DIAGNOSIS — H35033 Hypertensive retinopathy, bilateral: Secondary | ICD-10-CM | POA: Diagnosis not present

## 2017-07-26 DIAGNOSIS — I120 Hypertensive chronic kidney disease with stage 5 chronic kidney disease or end stage renal disease: Secondary | ICD-10-CM | POA: Diagnosis not present

## 2017-07-26 DIAGNOSIS — H4321 Crystalline deposits in vitreous body, right eye: Secondary | ICD-10-CM | POA: Diagnosis not present

## 2017-07-26 DIAGNOSIS — N186 End stage renal disease: Secondary | ICD-10-CM | POA: Diagnosis not present

## 2017-07-26 DIAGNOSIS — T82510D Breakdown (mechanical) of surgically created arteriovenous fistula, subsequent encounter: Secondary | ICD-10-CM | POA: Diagnosis not present

## 2017-07-26 DIAGNOSIS — D631 Anemia in chronic kidney disease: Secondary | ICD-10-CM | POA: Diagnosis not present

## 2017-07-26 DIAGNOSIS — H25013 Cortical age-related cataract, bilateral: Secondary | ICD-10-CM | POA: Diagnosis not present

## 2017-07-26 DIAGNOSIS — Z992 Dependence on renal dialysis: Secondary | ICD-10-CM | POA: Diagnosis not present

## 2017-07-27 DIAGNOSIS — N186 End stage renal disease: Secondary | ICD-10-CM | POA: Diagnosis not present

## 2017-07-27 DIAGNOSIS — N2581 Secondary hyperparathyroidism of renal origin: Secondary | ICD-10-CM | POA: Diagnosis not present

## 2017-07-27 DIAGNOSIS — D631 Anemia in chronic kidney disease: Secondary | ICD-10-CM | POA: Diagnosis not present

## 2017-07-27 DIAGNOSIS — D509 Iron deficiency anemia, unspecified: Secondary | ICD-10-CM | POA: Diagnosis not present

## 2017-07-28 ENCOUNTER — Encounter: Payer: Self-pay | Admitting: Vascular Surgery

## 2017-07-28 ENCOUNTER — Other Ambulatory Visit: Payer: Self-pay | Admitting: *Deleted

## 2017-07-28 ENCOUNTER — Ambulatory Visit (INDEPENDENT_AMBULATORY_CARE_PROVIDER_SITE_OTHER): Payer: Self-pay | Admitting: Vascular Surgery

## 2017-07-28 VITALS — BP 149/86 | HR 70 | Temp 97.0°F | Resp 18 | Ht 69.0 in | Wt 164.0 lb

## 2017-07-28 DIAGNOSIS — J441 Chronic obstructive pulmonary disease with (acute) exacerbation: Secondary | ICD-10-CM | POA: Diagnosis not present

## 2017-07-28 DIAGNOSIS — D631 Anemia in chronic kidney disease: Secondary | ICD-10-CM | POA: Diagnosis not present

## 2017-07-28 DIAGNOSIS — N186 End stage renal disease: Secondary | ICD-10-CM

## 2017-07-28 DIAGNOSIS — T82510D Breakdown (mechanical) of surgically created arteriovenous fistula, subsequent encounter: Secondary | ICD-10-CM | POA: Diagnosis not present

## 2017-07-28 DIAGNOSIS — Z992 Dependence on renal dialysis: Secondary | ICD-10-CM | POA: Diagnosis not present

## 2017-07-28 DIAGNOSIS — I120 Hypertensive chronic kidney disease with stage 5 chronic kidney disease or end stage renal disease: Secondary | ICD-10-CM | POA: Diagnosis not present

## 2017-07-28 NOTE — Progress Notes (Signed)
Patient is a 57 year old male who returns for follow-up today.  He recently underwent left upper extremity split thickness skin graft to cover an open wound from a previous hematoma evacuation after infiltration of an AV fistula.  He currently is dialyzing via right-sided catheter.  Physical exam:  Vitals:   07/28/17 1250  BP: (!) 149/86  Pulse: 70  Resp: 18  Temp: (!) 97 F (36.1 C)  TempSrc: Oral  SpO2: 98%  Weight: 164 lb (74.4 kg)  Height: 5\' 9"  (1.753 m)    Left upper extremity: 99% take left upper arm skin graft staples removed today  Left lower extremity: Well-healed left leg donor site  Right upper extremity: Palpable cephalic vein right upper arm 2+ brachial radial pulse  Data: I reviewed the patient's previous vein mapping ultrasound from August 2017.  This shows a 3-4 mm upper arm cephalic vein  Assessment: Healing left arm skin graft.  Needs new permanent access.  Plan: Right brachiocephalic AV fistula scheduled for August 09, 2017.  Risk benefits possible complications and procedure details were discussed with the patient today including but not limited to bleeding infection ischemic steal non-maturation of the fistula.  He understands and agrees to proceed.  Ruta Hinds, MD [facility] [place] Office: [phone] Pager: [phone]

## 2017-07-28 NOTE — H&P (View-Only) (Signed)
Patient is a 57 year old male who returns for follow-up today.  He recently underwent left upper extremity split thickness skin graft to cover an open wound from a previous hematoma evacuation after infiltration of an AV fistula.  He currently is dialyzing via right-sided catheter.  Physical exam:  Vitals:   07/28/17 1250  BP: (!) 149/86  Pulse: 70  Resp: 18  Temp: (!) 97 F (36.1 C)  TempSrc: Oral  SpO2: 98%  Weight: 164 lb (74.4 kg)  Height: 5\' 9"  (1.753 m)    Left upper extremity: 99% take left upper arm skin graft staples removed today  Left lower extremity: Well-healed left leg donor site  Right upper extremity: Palpable cephalic vein right upper arm 2+ brachial radial pulse  Data: I reviewed the patient's previous vein mapping ultrasound from August 2017.  This shows a 3-4 mm upper arm cephalic vein  Assessment: Healing left arm skin graft.  Needs new permanent access.  Plan: Right brachiocephalic AV fistula scheduled for August 09, 2017.  Risk benefits possible complications and procedure details were discussed with the patient today including but not limited to bleeding infection ischemic steal non-maturation of the fistula.  He understands and agrees to proceed.  Ruta Hinds, MD [facility] [place] Office: [phone] Pager: [phone]

## 2017-07-29 DIAGNOSIS — I120 Hypertensive chronic kidney disease with stage 5 chronic kidney disease or end stage renal disease: Secondary | ICD-10-CM | POA: Diagnosis not present

## 2017-07-29 DIAGNOSIS — D509 Iron deficiency anemia, unspecified: Secondary | ICD-10-CM | POA: Diagnosis not present

## 2017-07-29 DIAGNOSIS — T82510D Breakdown (mechanical) of surgically created arteriovenous fistula, subsequent encounter: Secondary | ICD-10-CM | POA: Diagnosis not present

## 2017-07-29 DIAGNOSIS — N2581 Secondary hyperparathyroidism of renal origin: Secondary | ICD-10-CM | POA: Diagnosis not present

## 2017-07-29 DIAGNOSIS — J441 Chronic obstructive pulmonary disease with (acute) exacerbation: Secondary | ICD-10-CM | POA: Diagnosis not present

## 2017-07-29 DIAGNOSIS — Z992 Dependence on renal dialysis: Secondary | ICD-10-CM | POA: Diagnosis not present

## 2017-07-29 DIAGNOSIS — D631 Anemia in chronic kidney disease: Secondary | ICD-10-CM | POA: Diagnosis not present

## 2017-07-29 DIAGNOSIS — N186 End stage renal disease: Secondary | ICD-10-CM | POA: Diagnosis not present

## 2017-07-30 NOTE — Discharge Summary (Signed)
   PT LEFT AMA SUMMARY  [name] MRN - [id number] DOB - Oct 10, 1960  Date of Admission - 07/04/2017 Date LEFT AMA: 07/18/2017  Attending Physician:  Cherene Altes  Patient's PCP:  [name], MD  Disposition: LEFT AMA  Follow-up Appts:  Not able to be arranged or discussed as pt LEFT AMA  Diagnoses at time pt LEFT AMA: Acute blood loss anemia Hemorrhagic shock Solitary rectal ulcer LGIB Acute hypoxic respiratory failure AECOPD due to RSV Pneumonitis  Chronic Atrial fib Split-thickness skin graft to left arm  SmallPericardial effusion Hyperglycemia - no hx of DM Hep Ccirrhosis + ascites ESRD Renal osteodystrophy  Initial presentation: 57 yr old male with a Hx of COPD, CAD, Substance abuse, HTN, ESRD on HD, Hep B&C, and Tubular adenoma who was admitted by Vasc Surgery on 12/24 for tx of a L arm wound due to skin necrosis following revision of a HD AVG.  He developed fever and resp distress 12/28 while still hospitalized, and was found to be RSV+.  He required intubation and transfer to the [facility] service.  On 1/4 he received scheduled HD and then began to have BRBPR leading to hemorrhagic shock w/ Hgb nadir of 5.5. CTA showed an active bleed in the rectum.  Pt was returned to the ED, tranfused, and GI was consulted.  Ultimately he was found to have a bleeding rectal ulcer.  Hospital Course: Listed below are the active problems present, and the status of the care of these problems, at the time the pt decided to [place]:  12/24 Admit, split thickness skin graft Lt arm  12/28 VDRF - intubated  12/29 RSV+ 12/30 extubated  12/30 [facility] - Cardiology consulted 1/4 BRBPR - GI consulted   Acute blood loss anemia - Hemorrhagic shock - Solitary rectal ulcer - LGIB clipped but had stopped bleeding prior - likely due to heparin - follow Hgb trend  Acute hypoxic respiratory failure - AECOPD due to RSV Pneumonitis  Resolved w/ pt now on RA  Chronic Atrial  fib Not previously on anticoag due to noncompliance - rate controlled   Split-thickness skin graft to left arm  Care as per Vascular Surgery   SmallPericardial effusion Noted on CTA chest - clinically insignificant   Hyperglycemia No clear hx of DM - check A1c   Hep Ccirrhosis + ascites  ESRD Nephrology following   Renal osteodystrophy    Medication List    Unable to be finalized as pt LEFT AMA  Day of Discharge Wt Readings from Last 3 Encounters:  07/28/17 74.4 kg (164 lb)  07/18/17 72.3 kg (159 lb 6.3 oz)  06/30/17 74.8 kg (165 lb)   Temp Readings from Last 3 Encounters:  07/28/17 (!) 97 F (36.1 C) (Oral)  07/18/17 98.4 F (36.9 C) (Oral)  06/30/17 98 F (36.7 C) (Oral)   BP Readings from Last 3 Encounters:  07/28/17 (!) 149/86  07/18/17 (!) 148/72  06/30/17 (!) 174/81   Pulse Readings from Last 3 Encounters:  07/28/17 70  07/18/17 84  06/30/17 87    Physical Exam: Exam not able to be completed at time of d/c as pt LEFT AMA  6:04 PM 07/30/17  Cherene Altes, MD [facility] Office  [phone] Pager [phone]  On-Call/Text Page:      [email or web address]      password [facility]

## 2017-08-01 DIAGNOSIS — D631 Anemia in chronic kidney disease: Secondary | ICD-10-CM | POA: Diagnosis not present

## 2017-08-01 DIAGNOSIS — D509 Iron deficiency anemia, unspecified: Secondary | ICD-10-CM | POA: Diagnosis not present

## 2017-08-01 DIAGNOSIS — N186 End stage renal disease: Secondary | ICD-10-CM | POA: Diagnosis not present

## 2017-08-01 DIAGNOSIS — B1910 Unspecified viral hepatitis B without hepatic coma: Secondary | ICD-10-CM | POA: Diagnosis not present

## 2017-08-01 DIAGNOSIS — B182 Chronic viral hepatitis C: Secondary | ICD-10-CM | POA: Diagnosis not present

## 2017-08-01 DIAGNOSIS — Z720 Tobacco use: Secondary | ICD-10-CM | POA: Diagnosis not present

## 2017-08-01 DIAGNOSIS — F419 Anxiety disorder, unspecified: Secondary | ICD-10-CM | POA: Diagnosis not present

## 2017-08-01 DIAGNOSIS — I10 Essential (primary) hypertension: Secondary | ICD-10-CM | POA: Diagnosis not present

## 2017-08-01 DIAGNOSIS — G8929 Other chronic pain: Secondary | ICD-10-CM | POA: Diagnosis not present

## 2017-08-01 DIAGNOSIS — I480 Paroxysmal atrial fibrillation: Secondary | ICD-10-CM | POA: Diagnosis not present

## 2017-08-01 DIAGNOSIS — I429 Cardiomyopathy, unspecified: Secondary | ICD-10-CM | POA: Diagnosis not present

## 2017-08-01 DIAGNOSIS — T148XXA Other injury of unspecified body region, initial encounter: Secondary | ICD-10-CM | POA: Diagnosis not present

## 2017-08-01 DIAGNOSIS — D638 Anemia in other chronic diseases classified elsewhere: Secondary | ICD-10-CM | POA: Diagnosis not present

## 2017-08-01 DIAGNOSIS — J449 Chronic obstructive pulmonary disease, unspecified: Secondary | ICD-10-CM | POA: Diagnosis not present

## 2017-08-01 DIAGNOSIS — N2581 Secondary hyperparathyroidism of renal origin: Secondary | ICD-10-CM | POA: Diagnosis not present

## 2017-08-03 DIAGNOSIS — N186 End stage renal disease: Secondary | ICD-10-CM | POA: Diagnosis not present

## 2017-08-03 DIAGNOSIS — I120 Hypertensive chronic kidney disease with stage 5 chronic kidney disease or end stage renal disease: Secondary | ICD-10-CM | POA: Diagnosis not present

## 2017-08-03 DIAGNOSIS — I4891 Unspecified atrial fibrillation: Secondary | ICD-10-CM | POA: Diagnosis not present

## 2017-08-03 DIAGNOSIS — N2581 Secondary hyperparathyroidism of renal origin: Secondary | ICD-10-CM | POA: Diagnosis not present

## 2017-08-03 DIAGNOSIS — J441 Chronic obstructive pulmonary disease with (acute) exacerbation: Secondary | ICD-10-CM | POA: Diagnosis not present

## 2017-08-03 DIAGNOSIS — T82510D Breakdown (mechanical) of surgically created arteriovenous fistula, subsequent encounter: Secondary | ICD-10-CM | POA: Diagnosis not present

## 2017-08-03 DIAGNOSIS — D509 Iron deficiency anemia, unspecified: Secondary | ICD-10-CM | POA: Diagnosis not present

## 2017-08-03 DIAGNOSIS — Z992 Dependence on renal dialysis: Secondary | ICD-10-CM | POA: Diagnosis not present

## 2017-08-03 DIAGNOSIS — D631 Anemia in chronic kidney disease: Secondary | ICD-10-CM | POA: Diagnosis not present

## 2017-08-04 DIAGNOSIS — F17200 Nicotine dependence, unspecified, uncomplicated: Secondary | ICD-10-CM | POA: Diagnosis not present

## 2017-08-04 DIAGNOSIS — I10 Essential (primary) hypertension: Secondary | ICD-10-CM | POA: Diagnosis not present

## 2017-08-04 DIAGNOSIS — I2510 Atherosclerotic heart disease of native coronary artery without angina pectoris: Secondary | ICD-10-CM | POA: Diagnosis not present

## 2017-08-04 DIAGNOSIS — R0989 Other specified symptoms and signs involving the circulatory and respiratory systems: Secondary | ICD-10-CM | POA: Diagnosis not present

## 2017-08-05 DIAGNOSIS — D631 Anemia in chronic kidney disease: Secondary | ICD-10-CM | POA: Diagnosis not present

## 2017-08-05 DIAGNOSIS — N186 End stage renal disease: Secondary | ICD-10-CM | POA: Diagnosis not present

## 2017-08-05 DIAGNOSIS — D509 Iron deficiency anemia, unspecified: Secondary | ICD-10-CM | POA: Diagnosis not present

## 2017-08-05 DIAGNOSIS — N2581 Secondary hyperparathyroidism of renal origin: Secondary | ICD-10-CM | POA: Diagnosis not present

## 2017-08-05 NOTE — Progress Notes (Signed)
Anesthesia Chart Review:  Pt is a same day work up  Pt is a 57 year old male scheduled for R brachiocephalic AV fistula creation with Ruta Hinds, MD  - PCP is [name], MD - Cardiology care at [facility].   PMH includes:  CAD (s/p DES to RCA 02/22/17), HTN, pulmonary hypertension, atrial fibrillation/flutter (no anticoagulation due to compliance issues), chronic hepatitis B, hepatitis C, anemia, ESRD on hemodialysis, COPD, hx cocaine abuse. Current smoker. BMI 24  -  Hospitalized 07/04/17 - 07/28/17 for skin graft on 07/04/17. On 12/28, developed fever/respiratory distress, RSV+, required intubation and ICU stay; extubated 12/30.  On 07/15/17, developed hemorrhagic shock (hgb 5.5) due to bleeding rectal ulcer (thought due to heparin).  Complicated by hep C cirrhosis +ascites. Pt LEFT AMA.   - Hospitalized 12/8-13/18 for atrial flutter with RVR, COPD exacerbation, bleeding from L arm AV dialysis access, anemia of chronic disease (s/p transfusion x2)  -  Hospitalized 12/2-7/18 for LUE infection (s/p I&D, wound vac), chronic hypoxic respiratory failure   - Hospitalized 11/18-30/18 for acute pulmonary edema due to volume overload, LUE AV fistula hematoma (s/p exploration 11/23, ligation of fistula 60/63; complicated by acute blood loss anemia requiring 11 units PRBCs). Also expressed SI, transferred to behavioral health 11/30-12/2/18  - Hospitalized 10/12-15/18 for acute respiratory failure with hypoxia likely due to acute pulmonary edema (from missing dialysis), acute metabolic encephalopathy, ascites   - Hospitalized 9/13-15/18 for acute hypoxic respiratory distress due to pulmonary edema, PAF with RVR (s/p cardioversion). Discharged at patient's insistence.    - Hospitalized 8/12-15/18 for SOB, unstable angina (s/p DES to RCA), afib with RVR, heart failure    Medications include: Albuterol, Symbicort, Plavix, diltiazem, Imdur, minoxidil, nicotine patch,  Spiriva  Labs will be obtained day of surgery.  - Last hgb 7.5 on 07/17/17.  Pt received PRBCs 07/18/17.   1 view CXR 07/14/17:  - COPD. Persistent left lower lobe atelectasis or pneumonia. Stable cardiomegaly with minimal pulmonary vascular congestion.  EKG 07/05/17: Atrial fibrillation. LAD. LBBB.LBBB present on EKG 05/29/17 and on EKG 02/20/17 prior to cardiac cath.   CT angio abd/pelvis 07/15/17:  1. Contrast extravasation consistent with active bleeding demonstrated in the rectum. No associated mass or wall thickening is appreciated. 2. Prominent diffuse abdominal and pelvic ascites. Small pericardial effusion. 3. Diffuse bone sclerosis likely representing renal osteodystrophy.  CT angio chest/abd/pelvis 07/08/17:  1. Very limited study due to streak artifact caused by patient's arms and anasarca. No aortic aneurysm or dissection. 2. Moderate Aortic Atherosclerosis. 3. Bibasilar clusters of nodularity concerning for pneumonia. Clinical correlation is recommended. 4. Moderate cardiomegaly and multi vessel coronary vascular calcification and findings of right heart dysfunction correlation with clinical exam and echocardiogram recommended. Pericardial effusion measuring 14 mm in thickness. 5. Osteosclerosis in keeping with renal osteodystrophy. 6. Small ascites and anasarca.  Echo 06/01/17:  - Left ventricle: The cavity size was normal. Wall thickness was increased in a pattern of moderate LVH. The study is nottechnically sufficient to allow evaluation of LV diastolicfunction. - Mitral valve: Calcified annulus and basal leaflets severe MSbased on gradients. Moderately calcified annulus. Moderatelythickened leaflets . There was mild regurgitation. - Left atrium: The atrium was moderately dilated. - Right ventricle: The cavity size was moderately dilated. - Right atrium: The atrium was moderately dilated. - Atrial septum: No defect or patent foramen ovale was identified. - Tricuspid  valve: There was severe regurgitation. - Pulmonary arteries: PA peak pressure: 95 mm Hg (S). - Pericardium, extracardiac: Small posterior  pericardial effusion  Cardiac cath 02/22/17:  - Severe single vessel coronary artery disease with high grade prox RCA stenosis - Successful PTCA and stent placement Xience [place] 3.5 X 23 mm - 0% residual stenosis. TIMI III flow. - Elevated right atrial pressures - Severe pulmonary hypertension, likely WHO Grp II - Elevated LVEDP  Pt will need further assessment by assigned anesthesiologist day of surgery. If no acute CV or respiratory symptoms, and labs acceptable, I anticipate pt can proceed with surgery as scheduled.   Willeen Cass, FNP-BC [facility]/Anesthesiology Phone: [phone] 08/05/2017 3:07 PM

## 2017-08-08 DIAGNOSIS — D631 Anemia in chronic kidney disease: Secondary | ICD-10-CM | POA: Diagnosis not present

## 2017-08-08 DIAGNOSIS — D509 Iron deficiency anemia, unspecified: Secondary | ICD-10-CM | POA: Diagnosis not present

## 2017-08-08 DIAGNOSIS — N2581 Secondary hyperparathyroidism of renal origin: Secondary | ICD-10-CM | POA: Diagnosis not present

## 2017-08-08 DIAGNOSIS — N186 End stage renal disease: Secondary | ICD-10-CM | POA: Diagnosis not present

## 2017-08-08 NOTE — Progress Notes (Signed)
Pre-op instructions left on pt voice mailbox according to pre-op checklist. Please complete pt assessments on DOS. Pt made aware to stop taking vitamins, fish oil and herbal medications.  Do not take any NSAIDs ie: Ibuprofen, Advil, Naproxen or any medication containing Aspirin.

## 2017-08-09 ENCOUNTER — Encounter (HOSPITAL_COMMUNITY): Payer: Self-pay | Admitting: *Deleted

## 2017-08-09 ENCOUNTER — Ambulatory Visit (HOSPITAL_COMMUNITY): Payer: Medicare Other | Admitting: Emergency Medicine

## 2017-08-09 ENCOUNTER — Encounter (HOSPITAL_COMMUNITY)
Admission: RE | Disposition: A | Discharge status: Home / Self Care | Payer: Self-pay | Source: Ambulatory Visit | Attending: Vascular Surgery

## 2017-08-09 ENCOUNTER — Ambulatory Visit (HOSPITAL_COMMUNITY)
Admission: RE | Admit: 2017-08-09 | Discharge: 2017-08-09 | Disposition: A | Discharge status: Home / Self Care | Payer: Medicare Other | Source: Ambulatory Visit | Attending: Vascular Surgery | Admitting: Vascular Surgery

## 2017-08-09 DIAGNOSIS — F17210 Nicotine dependence, cigarettes, uncomplicated: Secondary | ICD-10-CM | POA: Diagnosis not present

## 2017-08-09 DIAGNOSIS — Z8489 Family history of other specified conditions: Secondary | ICD-10-CM | POA: Diagnosis not present

## 2017-08-09 DIAGNOSIS — I739 Peripheral vascular disease, unspecified: Secondary | ICD-10-CM | POA: Insufficient documentation

## 2017-08-09 DIAGNOSIS — I252 Old myocardial infarction: Secondary | ICD-10-CM | POA: Diagnosis not present

## 2017-08-09 DIAGNOSIS — Z8249 Family history of ischemic heart disease and other diseases of the circulatory system: Secondary | ICD-10-CM | POA: Diagnosis not present

## 2017-08-09 DIAGNOSIS — I120 Hypertensive chronic kidney disease with stage 5 chronic kidney disease or end stage renal disease: Secondary | ICD-10-CM | POA: Diagnosis not present

## 2017-08-09 DIAGNOSIS — Z79899 Other long term (current) drug therapy: Secondary | ICD-10-CM | POA: Insufficient documentation

## 2017-08-09 DIAGNOSIS — Z801 Family history of malignant neoplasm of trachea, bronchus and lung: Secondary | ICD-10-CM | POA: Insufficient documentation

## 2017-08-09 DIAGNOSIS — J441 Chronic obstructive pulmonary disease with (acute) exacerbation: Secondary | ICD-10-CM | POA: Diagnosis not present

## 2017-08-09 DIAGNOSIS — K219 Gastro-esophageal reflux disease without esophagitis: Secondary | ICD-10-CM | POA: Diagnosis not present

## 2017-08-09 DIAGNOSIS — Z8619 Personal history of other infectious and parasitic diseases: Secondary | ICD-10-CM | POA: Diagnosis not present

## 2017-08-09 DIAGNOSIS — F1410 Cocaine abuse, uncomplicated: Secondary | ICD-10-CM | POA: Diagnosis not present

## 2017-08-09 DIAGNOSIS — E875 Hyperkalemia: Secondary | ICD-10-CM | POA: Insufficient documentation

## 2017-08-09 DIAGNOSIS — Z955 Presence of coronary angioplasty implant and graft: Secondary | ICD-10-CM | POA: Diagnosis not present

## 2017-08-09 DIAGNOSIS — Z800 Family history of malignant neoplasm of digestive organs: Secondary | ICD-10-CM | POA: Insufficient documentation

## 2017-08-09 DIAGNOSIS — Z86010 Personal history of colonic polyps: Secondary | ICD-10-CM | POA: Insufficient documentation

## 2017-08-09 DIAGNOSIS — Z87442 Personal history of urinary calculi: Secondary | ICD-10-CM | POA: Diagnosis not present

## 2017-08-09 DIAGNOSIS — T82510D Breakdown (mechanical) of surgically created arteriovenous fistula, subsequent encounter: Secondary | ICD-10-CM | POA: Diagnosis not present

## 2017-08-09 DIAGNOSIS — N186 End stage renal disease: Secondary | ICD-10-CM | POA: Diagnosis not present

## 2017-08-09 DIAGNOSIS — Z992 Dependence on renal dialysis: Secondary | ICD-10-CM | POA: Insufficient documentation

## 2017-08-09 DIAGNOSIS — M1990 Unspecified osteoarthritis, unspecified site: Secondary | ICD-10-CM | POA: Insufficient documentation

## 2017-08-09 DIAGNOSIS — Z886 Allergy status to analgesic agent status: Secondary | ICD-10-CM | POA: Diagnosis not present

## 2017-08-09 DIAGNOSIS — D631 Anemia in chronic kidney disease: Secondary | ICD-10-CM | POA: Diagnosis not present

## 2017-08-09 DIAGNOSIS — Z888 Allergy status to other drugs, medicaments and biological substances status: Secondary | ICD-10-CM | POA: Diagnosis not present

## 2017-08-09 DIAGNOSIS — Z825 Family history of asthma and other chronic lower respiratory diseases: Secondary | ICD-10-CM | POA: Diagnosis not present

## 2017-08-09 DIAGNOSIS — R079 Chest pain, unspecified: Secondary | ICD-10-CM | POA: Diagnosis not present

## 2017-08-09 DIAGNOSIS — N185 Chronic kidney disease, stage 5: Secondary | ICD-10-CM | POA: Diagnosis not present

## 2017-08-09 DIAGNOSIS — F419 Anxiety disorder, unspecified: Secondary | ICD-10-CM | POA: Insufficient documentation

## 2017-08-09 DIAGNOSIS — D696 Thrombocytopenia, unspecified: Secondary | ICD-10-CM | POA: Diagnosis not present

## 2017-08-09 HISTORY — PX: AV FISTULA PLACEMENT: SHX1204

## 2017-08-09 LAB — POCT I-STAT 4, (NA,K, GLUC, HGB,HCT)
Glucose, Bld: 90 mg/dL (ref 65–99)
HCT: 30 % — ABNORMAL LOW (ref 39.0–52.0)
Hemoglobin: 10.2 g/dL — ABNORMAL LOW (ref 13.0–17.0)
Potassium: 4.2 mmol/L (ref 3.5–5.1)
Sodium: 134 mmol/L — ABNORMAL LOW (ref 135–145)

## 2017-08-09 SURGERY — ARTERIOVENOUS (AV) FISTULA CREATION
Anesthesia: Monitor Anesthesia Care | Site: Arm Upper | Laterality: Right

## 2017-08-09 MED ORDER — PROPOFOL 10 MG/ML IV BOLUS
INTRAVENOUS | Status: AC
  Filled 2017-08-09: qty 20

## 2017-08-09 MED ORDER — FENTANYL CITRATE (PF) 100 MCG/2ML IJ SOLN: 25.0000 ug | INTRAMUSCULAR | Status: DC | PRN

## 2017-08-09 MED ORDER — SODIUM CHLORIDE 0.9 % IV SOLN
INTRAVENOUS | Status: DC
  Administered 2017-08-09: 08:00:00 via INTRAVENOUS

## 2017-08-09 MED ORDER — PHENYLEPHRINE 40 MCG/ML (10ML) SYRINGE FOR IV PUSH (FOR BLOOD PRESSURE SUPPORT)
PREFILLED_SYRINGE | INTRAVENOUS | Status: AC
  Filled 2017-08-09: qty 10

## 2017-08-09 MED ORDER — MIDAZOLAM HCL 2 MG/2ML IJ SOLN
INTRAMUSCULAR | Status: AC
  Filled 2017-08-09: qty 2

## 2017-08-09 MED ORDER — DEXTROSE 5 % IV SOLN
1.5000 g | INTRAVENOUS | Status: AC
  Administered 2017-08-09: 1.5 g via INTRAVENOUS

## 2017-08-09 MED ORDER — PHENYLEPHRINE HCL 10 MG/ML IJ SOLN
INTRAVENOUS | Status: DC | PRN
  Administered 2017-08-09: 25 ug/min via INTRAVENOUS

## 2017-08-09 MED ORDER — EPHEDRINE 5 MG/ML INJ
INTRAVENOUS | Status: AC
  Filled 2017-08-09: qty 10

## 2017-08-09 MED ORDER — PROPOFOL 500 MG/50ML IV EMUL
INTRAVENOUS | Status: DC | PRN
  Administered 2017-08-09: 60 ug/kg/min via INTRAVENOUS

## 2017-08-09 MED ORDER — FENTANYL CITRATE (PF) 250 MCG/5ML IJ SOLN
INTRAMUSCULAR | Status: AC
  Filled 2017-08-09: qty 5

## 2017-08-09 MED ORDER — ONDANSETRON HCL 4 MG/2ML IJ SOLN
INTRAMUSCULAR | Status: AC
  Filled 2017-08-09: qty 2

## 2017-08-09 MED ORDER — LIDOCAINE HCL (PF) 1 % IJ SOLN
INTRAMUSCULAR | Status: DC | PRN
  Administered 2017-08-09: 7.5 mL

## 2017-08-09 MED ORDER — PROTAMINE SULFATE 10 MG/ML IV SOLN
INTRAVENOUS | Status: DC | PRN
  Administered 2017-08-09: 30 mg via INTRAVENOUS

## 2017-08-09 MED ORDER — SUGAMMADEX SODIUM 200 MG/2ML IV SOLN
INTRAVENOUS | Status: AC
  Filled 2017-08-09: qty 2

## 2017-08-09 MED ORDER — HEPARIN SODIUM (PORCINE) 1000 UNIT/ML IJ SOLN
INTRAMUSCULAR | Status: DC | PRN
  Administered 2017-08-09: 3000 [IU] via INTRAVENOUS

## 2017-08-09 MED ORDER — LIDOCAINE HCL (PF) 1 % IJ SOLN
INTRAMUSCULAR | Status: AC
  Filled 2017-08-09: qty 30

## 2017-08-09 MED ORDER — MIDAZOLAM HCL 5 MG/5ML IJ SOLN
INTRAMUSCULAR | Status: DC | PRN
  Administered 2017-08-09: 2 mg via INTRAVENOUS

## 2017-08-09 MED ORDER — FENTANYL CITRATE (PF) 100 MCG/2ML IJ SOLN
INTRAMUSCULAR | Status: DC | PRN
  Administered 2017-08-09 (×2): 50 ug via INTRAVENOUS

## 2017-08-09 MED ORDER — SODIUM CHLORIDE 0.9 % IV SOLN
INTRAVENOUS | Status: DC | PRN
  Administered 2017-08-09: 500 mL

## 2017-08-09 MED ORDER — DEXAMETHASONE SODIUM PHOSPHATE 10 MG/ML IJ SOLN
INTRAMUSCULAR | Status: AC
  Filled 2017-08-09: qty 1

## 2017-08-09 MED ORDER — ONDANSETRON HCL 4 MG/2ML IJ SOLN: 4.0000 mg | Freq: Once | INTRAMUSCULAR | Status: DC | PRN

## 2017-08-09 MED ORDER — ONDANSETRON HCL 4 MG/2ML IJ SOLN
INTRAMUSCULAR | Status: DC | PRN
  Administered 2017-08-09: 4 mg via INTRAVENOUS

## 2017-08-09 MED ORDER — 0.9 % SODIUM CHLORIDE (POUR BTL) OPTIME
TOPICAL | Status: DC | PRN
  Administered 2017-08-09: 1000 mL

## 2017-08-09 MED ORDER — ROCURONIUM BROMIDE 10 MG/ML (PF) SYRINGE
PREFILLED_SYRINGE | INTRAVENOUS | Status: AC
  Filled 2017-08-09: qty 5

## 2017-08-09 MED ORDER — SUCCINYLCHOLINE CHLORIDE 200 MG/10ML IV SOSY
PREFILLED_SYRINGE | INTRAVENOUS | Status: AC
  Filled 2017-08-09: qty 10

## 2017-08-09 MED ORDER — LIDOCAINE 2% (20 MG/ML) 5 ML SYRINGE
INTRAMUSCULAR | Status: AC
  Filled 2017-08-09: qty 5

## 2017-08-09 MED ORDER — DEXTROSE 5 % IV SOLN
INTRAVENOUS | Status: AC
  Filled 2017-08-09: qty 1.5

## 2017-08-09 MED ORDER — OXYCODONE HCL 5 MG PO TABS: 5.0000 mg | ORAL_TABLET | Freq: Three times a day (TID) | ORAL | 0 refills | Status: DC | PRN

## 2017-08-09 SURGICAL SUPPLY — 42 items
ADH SKN CLS APL DERMABOND .7 (GAUZE/BANDAGES/DRESSINGS) ×1
ARMBAND PINK RESTRICT EXTREMIT (MISCELLANEOUS) ×6 IMPLANT
CANISTER SUCT 3000ML PPV (MISCELLANEOUS) ×3 IMPLANT
CANNULA VESSEL 3MM 2 BLNT TIP (CANNULA) ×3 IMPLANT
CLIP VESOCCLUDE MED 6/CT (CLIP) ×3 IMPLANT
CLIP VESOCCLUDE SM WIDE 6/CT (CLIP) ×3 IMPLANT
COVER PROBE W GEL 5X96 (DRAPES) ×2 IMPLANT
DECANTER SPIKE VIAL GLASS SM (MISCELLANEOUS) ×3 IMPLANT
DERMABOND ADVANCED (GAUZE/BANDAGES/DRESSINGS) ×2
DERMABOND ADVANCED .7 DNX12 (GAUZE/BANDAGES/DRESSINGS) ×1 IMPLANT
DRAIN PENROSE 1/4X12 LTX STRL (WOUND CARE) ×3 IMPLANT
ELECT REM PT RETURN 9FT ADLT (ELECTROSURGICAL) ×3
ELECTRODE REM PT RTRN 9FT ADLT (ELECTROSURGICAL) ×1 IMPLANT
GLOVE BIO SURGEON STRL SZ 6.5 (GLOVE) ×3 IMPLANT
GLOVE BIO SURGEON STRL SZ7.5 (GLOVE) ×5 IMPLANT
GLOVE BIO SURGEONS STRL SZ 6.5 (GLOVE) ×3
GLOVE BIOGEL PI IND STRL 6.5 (GLOVE) IMPLANT
GLOVE BIOGEL PI IND STRL 7.0 (GLOVE) IMPLANT
GLOVE BIOGEL PI IND STRL 7.5 (GLOVE) IMPLANT
GLOVE BIOGEL PI INDICATOR 6.5 (GLOVE) ×4
GLOVE BIOGEL PI INDICATOR 7.0 (GLOVE) ×2
GLOVE BIOGEL PI INDICATOR 7.5 (GLOVE) ×2
GLOVE SKINSENSE NS SZ6.5 (GLOVE) ×2
GLOVE SKINSENSE STRL SZ6.5 (GLOVE) IMPLANT
GOWN STRL REUS W/ TWL LRG LVL3 (GOWN DISPOSABLE) ×3 IMPLANT
GOWN STRL REUS W/TWL 2XL LVL3 (GOWN DISPOSABLE) ×2 IMPLANT
GOWN STRL REUS W/TWL LRG LVL3 (GOWN DISPOSABLE) ×12
KIT BASIN OR (CUSTOM PROCEDURE TRAY) ×3 IMPLANT
KIT ROOM TURNOVER OR (KITS) ×3 IMPLANT
LOOP VESSEL MINI RED (MISCELLANEOUS) IMPLANT
NS IRRIG 1000ML POUR BTL (IV SOLUTION) ×3 IMPLANT
PACK CV ACCESS (CUSTOM PROCEDURE TRAY) ×3 IMPLANT
PAD ARMBOARD 7.5X6 YLW CONV (MISCELLANEOUS) ×6 IMPLANT
SPONGE SURGIFOAM ABS GEL 100 (HEMOSTASIS) IMPLANT
SUT PROLENE 6 0 BV (SUTURE) ×3 IMPLANT
SUT PROLENE 7 0 BV 1 (SUTURE) ×5 IMPLANT
SUT VIC AB 3-0 SH 27 (SUTURE) ×3
SUT VIC AB 3-0 SH 27X BRD (SUTURE) ×1 IMPLANT
SUT VICRYL 4-0 PS2 18IN ABS (SUTURE) ×3 IMPLANT
TOWEL GREEN STERILE (TOWEL DISPOSABLE) ×3 IMPLANT
UNDERPAD 30X30 (UNDERPADS AND DIAPERS) ×3 IMPLANT
WATER STERILE IRR 1000ML POUR (IV SOLUTION) ×3 IMPLANT

## 2017-08-09 NOTE — Interval H&P Note (Signed)
History and Physical Interval Note:  08/09/2017 10:47 AM  [name]  has presented today for surgery, with the diagnosis of end stage renal disease  The various methods of treatment have been discussed with the patient and family. After consideration of risks, benefits and other options for treatment, the patient has consented to  Procedure(s): ARTERIOVENOUS (AV) [place] (Right) as a surgical intervention .  The patient's history has been reviewed, patient examined, no change in status, stable for surgery.  I have reviewed the patient's chart and labs.  Questions were answered to the patient's satisfaction.     Ruta Hinds

## 2017-08-09 NOTE — Transfer of Care (Signed)
Immediate Anesthesia Transfer of Care Note  Patient: [name]  Procedure(s) Performed: Creation Right arm ARTERIOVENOUS BRACHIOCEPOHALIC FISTULA (Right Arm Upper)  Patient Location: PACU  Anesthesia Type:MAC  Level of Consciousness: awake, alert  and patient cooperative  Airway & Oxygen Therapy: Patient Spontanous Breathing and Patient connected to nasal cannula oxygen  Post-op Assessment: Report given to RN and Post -op Vital signs reviewed and stable  Post vital signs: Reviewed and stable  Last Vitals:  Vitals:   08/09/17 0759 08/09/17 0800  BP:  140/81  Pulse: 84   Resp: 18   Temp: 36.9 C   SpO2: 98%     Last Pain:  Vitals:   08/09/17 0759  TempSrc: Oral      Patients Stated Pain Goal: 2 (42/35/36 1443)  Complications: No apparent anesthesia complications

## 2017-08-09 NOTE — Op Note (Signed)
Procedure: Right Brachial Cephalic AV fistula  Preop: ESRD  Postop: ESRD  Anesthesia: General  Assistant:Samantha Rhyne PA-C Findings: 3.5 mm cephalic vein  Procedure: After obtaining informed consent, the patient was taken to the operating room.  After adequate sedation, the right upper extremity was prepped and draped in usual sterile fashion.  A transverse incision was then made near the antecubital crease the right arm. The incision was carried into the subcutaneous tissues down to level of the cephalic vein. The cephalic vein was approximately 3.5 mm in diameter. It was sclerotic on its external surface. This was dissected free circumferentially and small side branches ligated and divided between silk ties or clips. Next the brachial artery was dissected free in the medial portion of the incision. The artery was  3-4 mm in diameter. The vessel loops were placed proximal and distal to the planned site of arteriotomy. The patient was given 3000 units of intravenous heparin. After appropriate circulation time, the vessel loops were used to control the artery. A longitudinal opening was made in the brachial artery.  The vein was ligated distally with a 2-0 silk tie. The vein was controlled proximally with a fine bulldog clamp. The vein was then swung over to the artery and sewn end of vein to side of artery using a running 7-0 Prolene suture. Just prior to completion of the anastomosis, everything was fore bled back bled and thoroughly flushed. The anastomosis was secured, vessel loops released.  The fistula filled immediately but was more pulsatile than thrill in character.  I could see the vein filling to the shoulder level. After hemostasis was obtained, the subcutaneous tissues were reapproximated using a running 3-0 Vicryl suture. The skin was then closed with a 4 Vicryl subcuticular stitch. Dermabond was applied to the skin incision.  The patient had a palpable radial pulse at the end of the  case.  Ruta Hinds, MD [facility] [place] Office: [phone] Pager: [phone]

## 2017-08-09 NOTE — Discharge Instructions (Signed)
° °  [facility]  Discharge Instructions  AV Fistula or Graft Surgery for Dialysis Access  Please refer to the following instructions for your post-procedure care. Your surgeon or physician assistant will discuss any changes with you.  Activity  You may drive the day following your surgery, if you are comfortable and no longer taking prescription pain medication. Resume full activity as the soreness in your incision resolves.  Bathing/Showering  You may shower after you go home. Keep your incision dry for 48 hours. Do not soak in a bathtub, hot tub, or swim until the incision heals completely. You may not shower if you have a hemodialysis catheter.  Incision Care  Clean your incision with mild soap and water after 48 hours. Pat the area dry with a clean towel. You do not need a bandage unless otherwise instructed. Do not apply any ointments or creams to your incision. You may have skin glue on your incision. Do not peel it off. It will come off on its own in about one week. Your arm may swell a bit after surgery. To reduce swelling use pillows to elevate your arm so it is above your heart. Your doctor will tell you if you need to lightly wrap your arm with an ACE bandage.  May place Band-Aid over blister on leg daily as needed.  Diet  Resume your normal diet. There are not special food restrictions following this procedure. In order to heal from your surgery, it is CRITICAL to get adequate nutrition. Your body requires vitamins, minerals, and protein. Vegetables are the best source of vitamins and minerals. Vegetables also provide the perfect balance of protein. Processed food has little nutritional value, so try to avoid this.  Medications  Resume taking all of your medications. If your incision is causing pain, you may take over-the counter pain relievers such as acetaminophen (Tylenol). If you were prescribed a stronger pain medication, please be aware  these medications can cause nausea and constipation. Prevent nausea by taking the medication with a snack or meal. Avoid constipation by drinking plenty of fluids and eating foods with high amount of fiber, such as fruits, vegetables, and grains. Do not take Tylenol if you are taking prescription pain medications.  Follow up Your surgeon may want to see you in the office following your access surgery. If so, this will be arranged at the time of your surgery.  Please call us immediately for any of the following conditions:  Increased pain, redness, drainage (pus) from your incision site Fever of 101 degrees or higher Severe or worsening pain at your incision site Hand pain or numbness.  Reduce your risk of vascular disease:  Stop smoking. If you would like help, call [facility] at 1-800-QUIT-NOW 916-242-6349) or [place] at [place] your cholesterol Maintain a desired weight Control your diabetes Keep your blood pressure down  Dialysis  It will take several weeks to several months for your new dialysis access to be ready for use. Your surgeon will determine when it is OK to use it. Your nephrologist will continue to direct your dialysis. You can continue to use your Permcath until your new access is ready for use.   08/09/2017 [name] [id number] [birth year]  Surgeon(s): Elam Dutch, MD  Procedure(s): Creation Right arm ARTERIOVENOUS BRACHIOCEPOHALIC FISTULA  x Do not stick fistula for 12 weeks    If you have any questions, please call the office at [phone].

## 2017-08-09 NOTE — Anesthesia Preprocedure Evaluation (Addendum)
Anesthesia Evaluation  Patient identified by MRN, date of birth, ID band Patient awake    Reviewed: Allergy & Precautions, NPO status , Patient's Chart, lab work & pertinent test results  Airway Mallampati: II  TM Distance: >3 FB Neck ROM: Full    Dental  (+) Teeth Intact, Dental Advisory Given, Poor Dentition   Pulmonary shortness of breath, COPD, Current Smoker,    Pulmonary exam normal breath sounds clear to auscultation       Cardiovascular hypertension, Pt. on medications + CAD, + Past MI, + Cardiac Stents and + Peripheral Vascular Disease  Normal cardiovascular exam Rhythm:Regular Rate:Normal     Neuro/Psych PSYCHIATRIC DISORDERS Anxiety negative neurological ROS     GI/Hepatic GERD  ,(+)     substance abuse  cocaine use, Hepatitis -, C, B  Endo/Other  negative endocrine ROS  Renal/GU Dialysis and ESRFRenal disease (MWF)     Musculoskeletal  (+) Arthritis ,   Abdominal   Peds  Hematology  (+) Blood dyscrasia (Plavix), anemia ,   Anesthesia Other Findings Day of surgery medications reviewed with the patient.  Reproductive/Obstetrics                           Anesthesia Physical Anesthesia Plan  ASA: III  Anesthesia Plan: MAC   Post-op Pain Management:    Induction: Intravenous  PONV Risk Score and Plan: 0 and Propofol infusion  Airway Management Planned: Nasal Cannula  Additional Equipment:   Intra-op Plan:   Post-operative Plan:   Informed Consent: I have reviewed the patients History and Physical, chart, labs and discussed the procedure including the risks, benefits and alternatives for the proposed anesthesia with the patient or authorized representative who has indicated his/her understanding and acceptance.   Dental advisory given  Plan Discussed with: CRNA and Anesthesiologist  Anesthesia Plan Comments: (Discussed risks/benefits/alternatives to MAC sedation  including need for ventilatory support, hypotension, need for conversion to general anesthesia.  All patient questions answered.  Patient/guardian wishes to proceed.)       Anesthesia Quick Evaluation

## 2017-08-09 NOTE — Anesthesia Postprocedure Evaluation (Signed)
Anesthesia Post Note  Patient: [name]  Procedure(s) Performed: Creation Right arm ARTERIOVENOUS BRACHIOCEPOHALIC FISTULA (Right Arm Upper)     Patient location during evaluation: PACU Anesthesia Type: MAC Level of consciousness: awake and alert Pain management: pain level controlled Vital Signs Assessment: post-procedure vital signs reviewed and stable Respiratory status: spontaneous breathing, nonlabored ventilation and respiratory function stable Cardiovascular status: stable and blood pressure returned to baseline Postop Assessment: no apparent nausea or vomiting Anesthetic complications: no    Last Vitals:  Vitals:   08/09/17 1300 08/09/17 1315  BP: (!) 139/57 (!) 148/100  Pulse: 91 90  Resp: 18 18  Temp: 36.7 C   SpO2: 93% 94%    Last Pain:  Vitals:   08/09/17 1300  TempSrc:   PainSc: 0-No pain                 Catalina Gravel

## 2017-08-10 ENCOUNTER — Encounter (HOSPITAL_COMMUNITY): Payer: Self-pay | Admitting: Vascular Surgery

## 2017-08-10 ENCOUNTER — Other Ambulatory Visit: Payer: Self-pay | Admitting: Adult Health

## 2017-08-10 ENCOUNTER — Telehealth: Payer: Self-pay | Admitting: Vascular Surgery

## 2017-08-10 DIAGNOSIS — N186 End stage renal disease: Secondary | ICD-10-CM | POA: Diagnosis not present

## 2017-08-10 DIAGNOSIS — N2581 Secondary hyperparathyroidism of renal origin: Secondary | ICD-10-CM | POA: Diagnosis not present

## 2017-08-10 DIAGNOSIS — D509 Iron deficiency anemia, unspecified: Secondary | ICD-10-CM | POA: Diagnosis not present

## 2017-08-10 DIAGNOSIS — D631 Anemia in chronic kidney disease: Secondary | ICD-10-CM | POA: Diagnosis not present

## 2017-08-10 MED ORDER — ALBUTEROL SULFATE HFA 108 (90 BASE) MCG/ACT IN AERS: 2.0000 | INHALATION_SPRAY | Freq: Four times a day (QID) | RESPIRATORY_TRACT | 0 refills | Status: DC | PRN

## 2017-08-10 NOTE — Telephone Encounter (Signed)
Sched appt 09/08/17; lab at 2:00 and MD at 3:00. Lm on pt's cell# to inform them of appt.

## 2017-08-10 NOTE — Telephone Encounter (Signed)
-----   Message from Mena Goes, RN sent at 08/09/2017  3:17 PM EST ----- Regarding: 4-6 weeks   ----- Message ----- From: Gabriel Earing, PA-C Sent: 08/09/2017  12:14 PM To: Vvs Charge Pool  S/p right BC AVF 08/09/17.  F/u with Dr. Oneida Alar in 4-6 weeks with duplex.  Thanks

## 2017-08-11 ENCOUNTER — Other Ambulatory Visit: Payer: Self-pay

## 2017-08-11 DIAGNOSIS — I120 Hypertensive chronic kidney disease with stage 5 chronic kidney disease or end stage renal disease: Secondary | ICD-10-CM | POA: Diagnosis not present

## 2017-08-11 DIAGNOSIS — N186 End stage renal disease: Secondary | ICD-10-CM | POA: Diagnosis not present

## 2017-08-11 DIAGNOSIS — Z48812 Encounter for surgical aftercare following surgery on the circulatory system: Secondary | ICD-10-CM

## 2017-08-11 DIAGNOSIS — Z992 Dependence on renal dialysis: Secondary | ICD-10-CM | POA: Diagnosis not present

## 2017-08-12 DIAGNOSIS — D509 Iron deficiency anemia, unspecified: Secondary | ICD-10-CM | POA: Diagnosis not present

## 2017-08-12 DIAGNOSIS — Z992 Dependence on renal dialysis: Secondary | ICD-10-CM | POA: Diagnosis not present

## 2017-08-12 DIAGNOSIS — I120 Hypertensive chronic kidney disease with stage 5 chronic kidney disease or end stage renal disease: Secondary | ICD-10-CM | POA: Diagnosis not present

## 2017-08-12 DIAGNOSIS — D689 Coagulation defect, unspecified: Secondary | ICD-10-CM | POA: Diagnosis not present

## 2017-08-12 DIAGNOSIS — R51 Headache: Secondary | ICD-10-CM | POA: Diagnosis not present

## 2017-08-12 DIAGNOSIS — N186 End stage renal disease: Secondary | ICD-10-CM | POA: Diagnosis not present

## 2017-08-12 DIAGNOSIS — L299 Pruritus, unspecified: Secondary | ICD-10-CM | POA: Diagnosis not present

## 2017-08-12 DIAGNOSIS — D631 Anemia in chronic kidney disease: Secondary | ICD-10-CM | POA: Diagnosis not present

## 2017-08-12 DIAGNOSIS — I4891 Unspecified atrial fibrillation: Secondary | ICD-10-CM | POA: Diagnosis not present

## 2017-08-12 DIAGNOSIS — N2581 Secondary hyperparathyroidism of renal origin: Secondary | ICD-10-CM | POA: Diagnosis not present

## 2017-08-15 DIAGNOSIS — R51 Headache: Secondary | ICD-10-CM | POA: Diagnosis not present

## 2017-08-15 DIAGNOSIS — N2581 Secondary hyperparathyroidism of renal origin: Secondary | ICD-10-CM | POA: Diagnosis not present

## 2017-08-15 DIAGNOSIS — L299 Pruritus, unspecified: Secondary | ICD-10-CM | POA: Diagnosis not present

## 2017-08-15 DIAGNOSIS — D631 Anemia in chronic kidney disease: Secondary | ICD-10-CM | POA: Diagnosis not present

## 2017-08-15 DIAGNOSIS — I4891 Unspecified atrial fibrillation: Secondary | ICD-10-CM | POA: Diagnosis not present

## 2017-08-15 DIAGNOSIS — D509 Iron deficiency anemia, unspecified: Secondary | ICD-10-CM | POA: Diagnosis not present

## 2017-08-15 DIAGNOSIS — N186 End stage renal disease: Secondary | ICD-10-CM | POA: Diagnosis not present

## 2017-08-15 DIAGNOSIS — D689 Coagulation defect, unspecified: Secondary | ICD-10-CM | POA: Diagnosis not present

## 2017-08-17 DIAGNOSIS — L299 Pruritus, unspecified: Secondary | ICD-10-CM | POA: Diagnosis not present

## 2017-08-17 DIAGNOSIS — N2581 Secondary hyperparathyroidism of renal origin: Secondary | ICD-10-CM | POA: Diagnosis not present

## 2017-08-17 DIAGNOSIS — D631 Anemia in chronic kidney disease: Secondary | ICD-10-CM | POA: Diagnosis not present

## 2017-08-17 DIAGNOSIS — D509 Iron deficiency anemia, unspecified: Secondary | ICD-10-CM | POA: Diagnosis not present

## 2017-08-17 DIAGNOSIS — D689 Coagulation defect, unspecified: Secondary | ICD-10-CM | POA: Diagnosis not present

## 2017-08-17 DIAGNOSIS — I4891 Unspecified atrial fibrillation: Secondary | ICD-10-CM | POA: Diagnosis not present

## 2017-08-17 DIAGNOSIS — N186 End stage renal disease: Secondary | ICD-10-CM | POA: Diagnosis not present

## 2017-08-17 DIAGNOSIS — R51 Headache: Secondary | ICD-10-CM | POA: Diagnosis not present

## 2017-08-19 DIAGNOSIS — N2581 Secondary hyperparathyroidism of renal origin: Secondary | ICD-10-CM | POA: Diagnosis not present

## 2017-08-19 DIAGNOSIS — D631 Anemia in chronic kidney disease: Secondary | ICD-10-CM | POA: Diagnosis not present

## 2017-08-19 DIAGNOSIS — N186 End stage renal disease: Secondary | ICD-10-CM | POA: Diagnosis not present

## 2017-08-19 DIAGNOSIS — I4891 Unspecified atrial fibrillation: Secondary | ICD-10-CM | POA: Diagnosis not present

## 2017-08-19 DIAGNOSIS — D689 Coagulation defect, unspecified: Secondary | ICD-10-CM | POA: Diagnosis not present

## 2017-08-19 DIAGNOSIS — D509 Iron deficiency anemia, unspecified: Secondary | ICD-10-CM | POA: Diagnosis not present

## 2017-08-19 DIAGNOSIS — R51 Headache: Secondary | ICD-10-CM | POA: Diagnosis not present

## 2017-08-19 DIAGNOSIS — L299 Pruritus, unspecified: Secondary | ICD-10-CM | POA: Diagnosis not present

## 2017-08-20 ENCOUNTER — Observation Stay (HOSPITAL_COMMUNITY)
Admission: EM | Admit: 2017-08-20 | Discharge: 2017-08-21 | Disposition: A | Discharge status: Home / Self Care | Payer: Medicare Other | Attending: Internal Medicine | Admitting: Internal Medicine

## 2017-08-20 ENCOUNTER — Other Ambulatory Visit: Payer: Self-pay

## 2017-08-20 ENCOUNTER — Encounter (HOSPITAL_COMMUNITY): Payer: Self-pay

## 2017-08-20 ENCOUNTER — Emergency Department (HOSPITAL_COMMUNITY): Payer: Medicare Other

## 2017-08-20 DIAGNOSIS — I252 Old myocardial infarction: Secondary | ICD-10-CM | POA: Insufficient documentation

## 2017-08-20 DIAGNOSIS — Z992 Dependence on renal dialysis: Secondary | ICD-10-CM | POA: Diagnosis not present

## 2017-08-20 DIAGNOSIS — K219 Gastro-esophageal reflux disease without esophagitis: Secondary | ICD-10-CM | POA: Diagnosis not present

## 2017-08-20 DIAGNOSIS — D649 Anemia, unspecified: Secondary | ICD-10-CM | POA: Diagnosis present

## 2017-08-20 DIAGNOSIS — F1410 Cocaine abuse, uncomplicated: Secondary | ICD-10-CM | POA: Insufficient documentation

## 2017-08-20 DIAGNOSIS — M19012 Primary osteoarthritis, left shoulder: Secondary | ICD-10-CM | POA: Insufficient documentation

## 2017-08-20 DIAGNOSIS — Z8249 Family history of ischemic heart disease and other diseases of the circulatory system: Secondary | ICD-10-CM | POA: Insufficient documentation

## 2017-08-20 DIAGNOSIS — D631 Anemia in chronic kidney disease: Secondary | ICD-10-CM | POA: Diagnosis not present

## 2017-08-20 DIAGNOSIS — F4323 Adjustment disorder with mixed anxiety and depressed mood: Secondary | ICD-10-CM | POA: Insufficient documentation

## 2017-08-20 DIAGNOSIS — N186 End stage renal disease: Secondary | ICD-10-CM | POA: Diagnosis not present

## 2017-08-20 DIAGNOSIS — Z955 Presence of coronary angioplasty implant and graft: Secondary | ICD-10-CM | POA: Insufficient documentation

## 2017-08-20 DIAGNOSIS — E8779 Other fluid overload: Secondary | ICD-10-CM

## 2017-08-20 DIAGNOSIS — G8929 Other chronic pain: Secondary | ICD-10-CM | POA: Insufficient documentation

## 2017-08-20 DIAGNOSIS — B181 Chronic viral hepatitis B without delta-agent: Secondary | ICD-10-CM | POA: Diagnosis present

## 2017-08-20 DIAGNOSIS — I2510 Atherosclerotic heart disease of native coronary artery without angina pectoris: Secondary | ICD-10-CM | POA: Insufficient documentation

## 2017-08-20 DIAGNOSIS — I700 Atherosclerosis of aorta: Secondary | ICD-10-CM | POA: Insufficient documentation

## 2017-08-20 DIAGNOSIS — Z7951 Long term (current) use of inhaled steroids: Secondary | ICD-10-CM | POA: Insufficient documentation

## 2017-08-20 DIAGNOSIS — E8770 Fluid overload, unspecified: Secondary | ICD-10-CM | POA: Diagnosis present

## 2017-08-20 DIAGNOSIS — I4892 Unspecified atrial flutter: Secondary | ICD-10-CM | POA: Diagnosis not present

## 2017-08-20 DIAGNOSIS — R000 Tachycardia, unspecified: Secondary | ICD-10-CM | POA: Diagnosis not present

## 2017-08-20 DIAGNOSIS — B182 Chronic viral hepatitis C: Secondary | ICD-10-CM | POA: Diagnosis not present

## 2017-08-20 DIAGNOSIS — I120 Hypertensive chronic kidney disease with stage 5 chronic kidney disease or end stage renal disease: Secondary | ICD-10-CM | POA: Diagnosis not present

## 2017-08-20 DIAGNOSIS — J449 Chronic obstructive pulmonary disease, unspecified: Secondary | ICD-10-CM | POA: Diagnosis present

## 2017-08-20 DIAGNOSIS — Z7902 Long term (current) use of antithrombotics/antiplatelets: Secondary | ICD-10-CM | POA: Insufficient documentation

## 2017-08-20 DIAGNOSIS — I484 Atypical atrial flutter: Secondary | ICD-10-CM | POA: Diagnosis present

## 2017-08-20 DIAGNOSIS — R0602 Shortness of breath: Secondary | ICD-10-CM | POA: Diagnosis not present

## 2017-08-20 DIAGNOSIS — R600 Localized edema: Secondary | ICD-10-CM | POA: Diagnosis not present

## 2017-08-20 DIAGNOSIS — I4891 Unspecified atrial fibrillation: Secondary | ICD-10-CM

## 2017-08-20 DIAGNOSIS — R079 Chest pain, unspecified: Secondary | ICD-10-CM | POA: Diagnosis not present

## 2017-08-20 DIAGNOSIS — F17210 Nicotine dependence, cigarettes, uncomplicated: Secondary | ICD-10-CM | POA: Diagnosis present

## 2017-08-20 DIAGNOSIS — R0789 Other chest pain: Secondary | ICD-10-CM | POA: Diagnosis not present

## 2017-08-20 LAB — CBC WITH DIFFERENTIAL/PLATELET
Basophils Absolute: 0 10*3/uL (ref 0.0–0.1)
Basophils Relative: 0 %
Eosinophils Absolute: 0 10*3/uL (ref 0.0–0.7)
Eosinophils Relative: 0 %
HCT: 28.6 % — ABNORMAL LOW (ref 39.0–52.0)
Hemoglobin: 9.6 g/dL — ABNORMAL LOW (ref 13.0–17.0)
Lymphocytes Relative: 10 %
Lymphs Abs: 1.2 10*3/uL (ref 0.7–4.0)
MCH: 28.2 pg (ref 26.0–34.0)
MCHC: 33.6 g/dL (ref 30.0–36.0)
MCV: 83.9 fL (ref 78.0–100.0)
Monocytes Absolute: 0.5 10*3/uL (ref 0.1–1.0)
Monocytes Relative: 4 %
Neutro Abs: 10.5 10*3/uL — ABNORMAL HIGH (ref 1.7–7.7)
Neutrophils Relative %: 86 %
Platelets: 202 10*3/uL (ref 150–400)
RBC: 3.41 MIL/uL — ABNORMAL LOW (ref 4.22–5.81)
RDW: 17.5 % — ABNORMAL HIGH (ref 11.5–15.5)
WBC: 12.1 10*3/uL — ABNORMAL HIGH (ref 4.0–10.5)

## 2017-08-20 LAB — I-STAT TROPONIN, ED: Troponin i, poc: 0.04 ng/mL (ref 0.00–0.08)

## 2017-08-20 LAB — COMPREHENSIVE METABOLIC PANEL
ALT: 58 U/L (ref 17–63)
AST: 83 U/L — ABNORMAL HIGH (ref 15–41)
Albumin: 3.8 g/dL (ref 3.5–5.0)
Alkaline Phosphatase: 163 U/L — ABNORMAL HIGH (ref 38–126)
Anion gap: 17 — ABNORMAL HIGH (ref 5–15)
BUN: 32 mg/dL — ABNORMAL HIGH (ref 6–20)
CO2: 25 mmol/L (ref 22–32)
Calcium: 9 mg/dL (ref 8.9–10.3)
Chloride: 90 mmol/L — ABNORMAL LOW (ref 101–111)
Creatinine, Ser: 5.47 mg/dL — ABNORMAL HIGH (ref 0.61–1.24)
GFR calc Af Amer: 12 mL/min — ABNORMAL LOW (ref >60)
GFR calc non Af Amer: 11 mL/min — ABNORMAL LOW (ref >60)
Glucose, Bld: 164 mg/dL — ABNORMAL HIGH (ref 65–99)
Potassium: 4.6 mmol/L (ref 3.5–5.1)
Sodium: 132 mmol/L — ABNORMAL LOW (ref 135–145)
Total Bilirubin: 1.1 mg/dL (ref 0.3–1.2)
Total Protein: 7.6 g/dL (ref 6.5–8.1)

## 2017-08-20 LAB — I-STAT CHEM 8, ED
BUN: 34 mg/dL — ABNORMAL HIGH (ref 6–20)
Calcium, Ion: 1.01 mmol/L — ABNORMAL LOW (ref 1.15–1.40)
Chloride: 91 mmol/L — ABNORMAL LOW (ref 101–111)
Creatinine, Ser: 5.4 mg/dL — ABNORMAL HIGH (ref 0.61–1.24)
Glucose, Bld: 158 mg/dL — ABNORMAL HIGH (ref 65–99)
HCT: 33 % — ABNORMAL LOW (ref 39.0–52.0)
Hemoglobin: 11.2 g/dL — ABNORMAL LOW (ref 13.0–17.0)
Potassium: 4.6 mmol/L (ref 3.5–5.1)
Sodium: 132 mmol/L — ABNORMAL LOW (ref 135–145)
TCO2: 31 mmol/L (ref 22–32)

## 2017-08-20 LAB — BRAIN NATRIURETIC PEPTIDE: B Natriuretic Peptide: 1418.4 pg/mL — ABNORMAL HIGH (ref 0.0–100.0)

## 2017-08-20 LAB — MRSA PCR SCREENING: MRSA by PCR: NEGATIVE

## 2017-08-20 MED ORDER — GABAPENTIN 100 MG PO CAPS
100.0000 mg | ORAL_CAPSULE | Freq: Two times a day (BID) | ORAL | Status: DC
  Administered 2017-08-20 – 2017-08-21 (×2): 100 mg via ORAL
  Filled 2017-08-20 (×2): qty 1

## 2017-08-20 MED ORDER — ACETAMINOPHEN 325 MG PO TABS
650.0000 mg | ORAL_TABLET | ORAL | Status: DC | PRN
Start: 2017-08-20 — End: 2017-08-21

## 2017-08-20 MED ORDER — DILTIAZEM HCL-DEXTROSE 100-5 MG/100ML-% IV SOLN (PREMIX)
5.0000 mg/h | Freq: Once | INTRAVENOUS | Status: AC
  Administered 2017-08-20: 5 mg/h via INTRAVENOUS
  Filled 2017-08-20: qty 100

## 2017-08-20 MED ORDER — ALBUTEROL SULFATE (2.5 MG/3ML) 0.083% IN NEBU: 2.5000 mg | INHALATION_SOLUTION | Freq: Four times a day (QID) | RESPIRATORY_TRACT | Status: DC | PRN

## 2017-08-20 MED ORDER — SENNA 8.6 MG PO TABS: 1.0000 | ORAL_TABLET | Freq: Every day | ORAL | Status: DC | PRN

## 2017-08-20 MED ORDER — TIOTROPIUM BROMIDE MONOHYDRATE 18 MCG IN CAPS
18.0000 ug | ORAL_CAPSULE | Freq: Every day | RESPIRATORY_TRACT | Status: DC
  Administered 2017-08-21: 18 ug via RESPIRATORY_TRACT
  Filled 2017-08-20: qty 5

## 2017-08-20 MED ORDER — ACETAMINOPHEN 325 MG PO TABS: 650.0000 mg | ORAL_TABLET | Freq: Four times a day (QID) | ORAL | Status: DC | PRN

## 2017-08-20 MED ORDER — HYDROMORPHONE HCL 1 MG/ML IJ SOLN
1.0000 mg | Freq: Once | INTRAMUSCULAR | Status: AC
  Administered 2017-08-20: 1 mg via INTRAVENOUS
  Filled 2017-08-20: qty 1

## 2017-08-20 MED ORDER — OXYCODONE HCL 5 MG PO TABS
5.0000 mg | ORAL_TABLET | Freq: Three times a day (TID) | ORAL | Status: DC | PRN
  Administered 2017-08-20 – 2017-08-21 (×2): 5 mg via ORAL
  Filled 2017-08-20 (×3): qty 1

## 2017-08-20 MED ORDER — CLOPIDOGREL BISULFATE 75 MG PO TABS
75.0000 mg | ORAL_TABLET | Freq: Every day | ORAL | Status: DC
Start: 2017-08-21 — End: 2017-08-21
  Administered 2017-08-21: 75 mg via ORAL
  Filled 2017-08-20: qty 1

## 2017-08-20 MED ORDER — SEVELAMER CARBONATE 800 MG PO TABS
3200.0000 mg | ORAL_TABLET | Freq: Three times a day (TID) | ORAL | Status: DC
  Administered 2017-08-21 (×2): 3200 mg via ORAL
  Filled 2017-08-20 (×2): qty 4

## 2017-08-20 MED ORDER — ZOLPIDEM TARTRATE 5 MG PO TABS
5.0000 mg | ORAL_TABLET | Freq: Once | ORAL | Status: AC
  Administered 2017-08-20: 5 mg via ORAL
  Filled 2017-08-20: qty 1

## 2017-08-20 MED ORDER — DILTIAZEM HCL-DEXTROSE 100-5 MG/100ML-% IV SOLN (PREMIX): 5.0000 mg/h | INTRAVENOUS | Status: DC

## 2017-08-20 MED ORDER — MOMETASONE FURO-FORMOTEROL FUM 200-5 MCG/ACT IN AERO
2.0000 | INHALATION_SPRAY | Freq: Two times a day (BID) | RESPIRATORY_TRACT | Status: DC
Start: 2017-08-21 — End: 2017-08-21
  Administered 2017-08-21: 2 via RESPIRATORY_TRACT
  Filled 2017-08-20: qty 8.8

## 2017-08-20 MED ORDER — ONDANSETRON HCL 4 MG/2ML IJ SOLN: 4.0000 mg | Freq: Four times a day (QID) | INTRAMUSCULAR | Status: DC | PRN

## 2017-08-20 MED ORDER — DILTIAZEM HCL 25 MG/5ML IV SOLN
10.0000 mg | Freq: Once | INTRAVENOUS | Status: AC
  Administered 2017-08-20: 10 mg via INTRAVENOUS
  Filled 2017-08-20: qty 5

## 2017-08-20 MED ORDER — CALCITRIOL 0.25 MCG PO CAPS: 0.7500 ug | ORAL_CAPSULE | ORAL | Status: DC

## 2017-08-20 NOTE — ED Notes (Signed)
Attempted to call report

## 2017-08-20 NOTE — Progress Notes (Signed)
MD notified to verify Cardizem order and pt requesting for something to help sleep. Verbal order to hold cardizem at this time and one time dose of ambien 5mg  PO. Will continue to monitor. Isac Caddy, RN

## 2017-08-20 NOTE — ED Provider Notes (Signed)
[facility] EMERGENCY DEPARTMENT Provider Note   CSN: [id number] Arrival date & time: 08/20/17  1427     History   Chief Complaint Chief Complaint  Patient presents with  . Chest Pain    HPI Eddison Searls is a 57 y.o. male.  The history is provided by the patient. No language interpreter was used.    Meredith Mells is a 57 y.o. male who presents to the Emergency Department complaining of chest pain.  He has a history of ESRD and is on hemodialysis.  He reports increased generalized body edema for the last few days.  Yesterday he had a 4-hour dialysis session but was not able to get down to his dry weight.  Today he reports increased shortness of breath with central chest heaviness.  Chest pain is constant and nonradiating.  No fevers, vomiting, abdominal pain.  He does have a nonproductive cough.  He has had similar symptoms in the past related to volume overload.  Past Medical History:  Diagnosis Date  . Adenomatous colon polyp    tubular  . Anemia   . Anxiety   . Arthritis    left shoulder  . Atherosclerosis of aorta ([place])   . Cardiomegaly   . Chest pain    DATE UNKNOWN, C/O PERIODICALLY  . Cocaine abuse ([place])   . COPD exacerbation ([place]) 08/17/2016  . Coronary artery disease    stent 02/22/17  . Dialysis patient ([place])    Monday-Wednesday-Friday  . ESRD (end stage renal disease) on dialysis ([place])    "[name]; MWF" (07/04/2017)  . GERD (gastroesophageal reflux disease)    DATE UNKNOWN  . Hemorrhoids   . Hepatitis B, chronic ([place])   . Hepatitis C   . History of kidney stones   . Hyperkalemia   . Hypertension   . Metabolic bone disease    Patient denies  . Myocardial infarction ([place])   . Pneumonia   . Pulmonary edema   . Renal disorder   . Renal insufficiency   . Shortness of breath dyspnea    " for the last past year with this dialysis"  . Tubular adenoma of colon     Patient Active Problem List   Diagnosis Date Noted    . Hematochezia 07/15/2017  . Wide-complex tachycardia ([place])   . Endotracheally intubated   . ESRD (end stage renal disease) ([place]) 07/04/2017  . Acute respiratory failure with hypoxia ([place]) 06/18/2017  . CKD (chronic kidney disease) stage V requiring chronic dialysis ([place]) 06/18/2017  . History of Cocaine abuse ([place]) 06/18/2017  . Hypertension 06/18/2017  . Infection of AV graft for dialysis ([place]) 06/18/2017  . Anxiety 06/18/2017  . Anemia due to chronic kidney disease 06/18/2017  . Atrial flutter with rapid ventricular response ([place]) 06/18/2017  . Personality disorder ([place]) 06/13/2017  . Cellulitis 06/12/2017  . Adjustment disorder with mixed anxiety and depressed mood 06/10/2017  . Suicidal ideation 06/10/2017  . Arm wound, left, sequela 06/10/2017  . Dyspnea 05/29/2017  . Tachycardia 05/29/2017  . Hyperkalemia 05/22/2017  . Acute metabolic encephalopathy   . Anemia 04/23/2017  . Ascites 04/23/2017  . COPD (chronic obstructive pulmonary disease) ([place]) 04/23/2017  . Acute on chronic respiratory failure with hypoxia ([place]) 03/25/2017  . Atrial flutter ([place]) 03/25/2017  . Arrhythmia 03/25/2017  . COPD GOLD II/ still smoking 09/27/2016  . Essential hypertension 09/27/2016  . Fluid overload 08/30/2016  . COPD exacerbation ([place]) 08/17/2016  . Hypertensive urgency 08/17/2016  .  Respiratory failure ([place]) 08/17/2016  . Problem with dialysis access ([place]) 07/23/2016  . End stage renal disease ([place]) 12/02/2015  . Chronic hepatitis B ([place]) 03/05/2014  . Chronic hepatitis C without hepatic coma ([place]) 03/05/2014  . Internal hemorrhoids with bleeding, swelling and itching 03/05/2014  . Thrombocytopenia ([place]) 03/05/2014  . Chest pain 02/27/2014  . Alcohol abuse 04/14/2009  . Tobacco use disorder 04/14/2009  . GANGLION CYST 04/14/2009    Past Surgical History:  Procedure Laterality Date  . A/V FISTULAGRAM Left 05/26/2017   Procedure: A/V FISTULAGRAM;  Surgeon: Conrad [place], MD;   Location: [facility];  Service: Cardiovascular;  Laterality: Left;  . APPLICATION OF WOUND VAC Left 06/14/2017   Procedure: APPLICATION OF WOUND VAC;  Surgeon: Katha Cabal, MD;  Location: [facility];  Service: Vascular;  Laterality: Left;  . AV FISTULA PLACEMENT  2012   BELIEVED WAS PLACED IN JUNE  . AV FISTULA PLACEMENT Right 08/09/2017   Procedure: Creation Right arm ARTERIOVENOUS BRACHIOCEPOHALIC FISTULA;  Surgeon: Elam Dutch, MD;  Location: [facility];  Service: Vascular;  Laterality: Right;  . COLONOSCOPY    . CORONARY STENT INTERVENTION N/A 02/22/2017   Procedure: CORONARY STENT INTERVENTION;  Surgeon: Nigel Mormon, MD;  Location: [facility];  Service: Cardiovascular;  Laterality: N/A;  . FLEXIBLE SIGMOIDOSCOPY N/A 07/15/2017   Procedure: FLEXIBLE SIGMOIDOSCOPY;  Surgeon: Carol Ada, MD;  Location: [facility];  Service: Endoscopy;  Laterality: N/A;  . HEMORRHOID BANDING    . I&D EXTREMITY Left 06/01/2017   Procedure: IRRIGATION AND DEBRIDEMENT LEFT ARM HEMATOMA WITH LIGATION OF LEFT ARM AV FISTULA;  Surgeon: Elam Dutch, MD;  Location: [facility];  Service: Vascular;  Laterality: Left;  . I&D EXTREMITY Left 06/14/2017   Procedure: IRRIGATION AND DEBRIDEMENT EXTREMITY;  Surgeon: Katha Cabal, MD;  Location: [facility];  Service: Vascular;  Laterality: Left;  . INSERTION OF DIALYSIS CATHETER  05/30/2017  . INSERTION OF DIALYSIS CATHETER N/A 05/30/2017   Procedure: INSERTION OF DIALYSIS CATHETER;  Surgeon: Elam Dutch, MD;  Location[facility];  Service: Vascular;  Laterality: N/A;  . LEFT HEART CATH AND CORONARY ANGIOGRAPHY N/A 02/22/2017   Procedure: LEFT HEART CATH AND CORONARY ANGIOGRAPHY;  Surgeon: Nigel Mormon, MD;  Location: [facility];  Service: Cardiovascular;  Laterality: N/A;  . LIGATION OF ARTERIOVENOUS  FISTULA Left 01/17/3902   Procedure: Plication of Left Arm Arteriovenous Fistula;  Surgeon: Elam Dutch, MD;  Location: [facility];  Service: Vascular;  Laterality: Left;  . POLYPECTOMY    . REVISON OF ARTERIOVENOUS FISTULA Left 0/03/2329   Procedure: PLICATION OF DISTAL ANEURYSMAL SEGEMENT OF LEFT UPPER ARM ARTERIOVENOUS FISTULA;  Surgeon: Elam Dutch, MD;  Location: [facility];  Service: Vascular;  Laterality: Left;  . REVISON OF ARTERIOVENOUS FISTULA Left 0/76/2263   Procedure: Plication of Left Upper Arm Fistula ;  Surgeon: Waynetta Sandy, MD;  Location: [facility];  Service: Vascular;  Laterality: Left;  . SKIN GRAFT SPLIT THICKNESS LEG / FOOT Left    SKIN GRAFT SPLIT THICKNESS LEFT ARM DONOR SITE: LEFT ANTERIOR THIGH  . SKIN SPLIT GRAFT Left 07/04/2017   Procedure: SKIN GRAFT SPLIT THICKNESS LEFT ARM DONOR SITE: LEFT ANTERIOR THIGH;  Surgeon: Elam Dutch, MD;  Location: [facility];  Service: Vascular;  Laterality: Left;  . THROMBECTOMY W/ EMBOLECTOMY Left 06/05/2017   Procedure: EXPLORATION OF LEFT ARM FOR BLEEDING; OVERSEWED PROXIMAL FISTULA;  Surgeon: Angelia Mould, MD;  Location: [facility];  Service: Vascular;  Laterality: Left;  . WOUND EXPLORATION Left 06/03/2017   Procedure: WOUND EXPLORATION WITH WOUND VAC APPLICATION TO LEFT ARM;  Surgeon: Angelia Mould, MD;  Location: [facility];  Service: Vascular;  Laterality: Left;       Home Medications    Prior to Admission medications   Medication Sig Start Date End Date Taking? Authorizing Provider  acetaminophen (TYLENOL) 325 MG tablet Take 2 tablets (650 mg total) by mouth every 6 (six) hours as needed for mild pain (or Fever >/= 101). 06/23/17   Shahmehdi, Valeria Batman, MD  albuterol (PROVENTIL HFA) 108 (90 Base) MCG/ACT inhaler Inhale 2 puffs into the lungs every 6 (six) hours as needed for wheezing or shortness of breath. 08/10/17   Parrett, Fonnie Mu, NP  albuterol (PROVENTIL) (2.5 MG/3ML) 0.083% nebulizer solution Inhale 3 mLs (2.5 mg total) into the lungs every 4 (four) hours as needed for wheezing or shortness of breath. 07/15/17   Dagoberto Ligas, PA-C  budesonide-formoterol (SYMBICORT) 160-4.5 MCG/ACT inhaler Inhale 2 puffs into the lungs every 12 (twelve) hours.    [provider]  calcitRIOL (ROCALTROL) 0.25 MCG capsule Take 3 capsules (0.75 mcg total) by mouth every Monday, Wednesday, and Friday with hemodialysis. 03/28/17   Cherene Altes, MD  clopidogrel (PLAVIX) 75 MG tablet Take 1 tablet (75 mg total) by mouth daily. 07/15/17   Rhyne, Hulen Shouts, PA-C  diltiazem (CARDIZEM CD) 120 MG 24 hr capsule Take 1 capsule (120 mg total) by mouth daily. 06/23/17 06/23/18  ShahmehdiValeria Batman, MD  gabapentin (NEURONTIN) 100 MG capsule Take 100 mg 2 (two) times daily by mouth.    [provider]  isosorbide mononitrate (IMDUR) 30 MG 24 hr tablet Take 1 tablet (30 mg total) by mouth daily. 06/17/17   [name], MD  Melatonin 5 MG TABS Take 5 mg by mouth at bedtime as needed (sleep).    [provider]  minoxidil (LONITEN) 2.5 MG tablet Take 1 tablet (2.5 mg total) by mouth 2 (two) times daily. 06/23/17 07/23/17  Deatra James, MD  multivitamin (RENA-VIT) TABS tablet Take 1 tablet by mouth at bedtime. 06/17/17   [name], MD  nicotine (NICODERM CQ - DOSED IN MG/24 HOURS) 14 mg/24hr patch Place 14 mg daily onto the skin. 03/30/17   [provider]  oxyCODONE (ROXICODONE) 5 MG immediate release tablet Take 1 tablet (5 mg total) by mouth every 8 (eight) hours as needed for moderate pain. 08/09/17   Rhyne, Hulen Shouts, PA-C  senna (SENOKOT) 8.6 MG TABS tablet Take 1 tablet (8.6 mg total) by mouth daily as needed for mild constipation. 06/10/17   Mariel Aloe, MD  sevelamer carbonate (RENVELA) 800 MG tablet Take 4 tablets (3,200 mg total) by mouth 3 (three) times daily with meals. 06/10/17   Mariel Aloe, MD  Spacer/Aero-Holding Chambers (AEROCHAMBER MV) inhaler Use as instructed 03/10/17   Javier Glazier, MD  tiotropium [facility]) 18 MCG inhalation capsule Place 1 capsule (18 mcg total) into inhaler  and inhale daily. 07/16/17   Dagoberto Ligas, PA-C  vitamin C (VITAMIN C) 500 MG tablet Take 1 tablet (500 mg total) by mouth 2 (two) times daily. 06/17/17   [name], MD  zolpidem (AMBIEN) 10 MG tablet Take 1 tablet (10 mg total) by mouth at bedtime as needed for sleep. 06/10/17   Mariel Aloe, MD    Family History Family History  Problem Relation Age of Onset  .  Heart disease Mother   . Lung cancer Mother   . Heart disease Father   . Malignant hyperthermia Father   . COPD Father   . Throat cancer Sister   . Esophageal cancer Sister   . Hypertension Other   . COPD Other   . Colon cancer Neg Hx   . Colon polyps Neg Hx   . Rectal cancer Neg Hx   . Stomach cancer Neg Hx     Social History Social History   Tobacco Use  . Smoking status: Current Every Day Smoker    Packs/day: 0.50    Years: 43.00    Pack years: 21.50    Types: Cigarettes    Start date: 08/13/1973  . Smokeless tobacco: Never Used  Substance Use Topics  . Alcohol use: Yes    Frequency: Never    Comment: quit drinking in 2017  . Drug use: Yes    Types: Marijuana, Cocaine    Comment: quit in 2017"     Allergies   Aspirin; Clonidine derivatives; and Tramadol   Review of Systems Review of Systems  All other systems reviewed and are negative.    Physical Exam Updated Vital Signs Ht 5\' 9"  (1.753 m)   Wt 72.6 kg (160 lb)   SpO2 97%   BMI 23.63 kg/m   Physical Exam  Constitutional: He is oriented to person, place, and time. He appears well-developed and well-nourished.  HENT:  Head: Normocephalic and atraumatic.  Mild periorbital edema  Cardiovascular: Regular rhythm.  No murmur heard. Tachycardic  Pulmonary/Chest: Effort normal. No respiratory distress.  Decreased air movement in bilateral bases  Abdominal: Soft. There is no tenderness. There is no rebound and no guarding.  Musculoskeletal: He exhibits no tenderness.  2-3+ pitting edema to bilateral lower extremities  Neurological: He is  alert and oriented to person, place, and time.  Skin: Skin is warm and dry.  Psychiatric: He has a normal mood and affect. His behavior is normal.  Nursing note and vitals reviewed.    ED Treatments / Results  Labs (all labs ordered are listed, but only abnormal results are displayed) Labs Reviewed - No data to display  EKG  EKG Interpretation None       Radiology No results found.  Procedures Procedures (including critical care time) CRITICAL CARE Performed by: Quintella Reichert   Total critical care time: 35 minutes  Critical care time was exclusive of separately billable procedures and treating other patients.  Critical care was necessary to treat or prevent imminent or life-threatening deterioration.  Critical care was time spent personally by me on the following activities: development of treatment plan with patient and/or surrogate as well as nursing, discussions with consultants, evaluation of patient's response to treatment, examination of patient, obtaining history from patient or surrogate, ordering and performing treatments and interventions, ordering and review of laboratory studies, ordering and review of radiographic studies, pulse oximetry and re-evaluation of patient's condition.  Medications Ordered in ED Medications - No data to display   Initial Impression / Assessment and Plan / ED Course  I have reviewed the triage vital signs and the nursing notes.  Pertinent labs & imaging results that were available during my care of the patient were reviewed by me and considered in my medical decision making (see chart for details).     Patient here for evaluation of progressive edema, chest tightness.  He is very edematous on exam with no respiratory distress.  Initial EKG with sinus tachycardia versus  atrial flutter.  Following treatment with diltiazem he did change rhythm to A. fib with RVR, rate around 100.  He was started on a diltiazem drip.  Medicine  consulted for admission for A. fib with RVR with anasarca.  Discussed with Dr. Melvia Heaps with nephrology regarding patient's admission.  Final Clinical Impressions(s) / ED Diagnoses   Final diagnoses:  None    ED Discharge Orders    None       Quintella Reichert, MD 08/21/17 [phone]

## 2017-08-20 NOTE — Progress Notes (Signed)
Pt c/o of pain 9/10. MD notified. New orders placed. Will continue to monitor. Isac Caddy, RN

## 2017-08-20 NOTE — Progress Notes (Signed)
New Admission Note:   Arrival Method: From ED via stretche Mental Orientation: A&Ox4 Telemetry: 4E12 Assessment: Completed Skin: Intact IV: L FA Pain: 9/10 Tubes: None Safety Measures: Safety Fall Prevention Plan has been discussed  Admission: [facility] [place] Orientation: Patient has been orientated to the room, unit and staff.  Family: none present at bedside  Orders to be reviewed and implemented. Will continue to monitor the patient. Call light has been placed within reach and bed alarm has been activated. Tele box applied. CCMD notified    Isac Caddy, RN

## 2017-08-20 NOTE — ED Notes (Signed)
BP noted as low and Cardizem stopped. Dr. Ralene Bathe aware.

## 2017-08-20 NOTE — H&P (Signed)
History and Physical    [name] IDP:[id number] DOB: 12/13/1960 DOA: 08/20/2017  PCP: [name], MD   Patient coming from: Home   Chief Complaint: Edema, shortness of breath  HPI: [name] is a 57 y.o. male with medical history significant of ESRD on Tuesday Thursday Saturday dialysis status post left AV fistula complicated by fistula infection status post skin graft, status post PermCath placement and right chest status post right AV fistula pending maturity, chronic atrial flutter/fibrillation not on anticoagulation, hepatitis B and hepatitis C, COPD who comes in with edema and some shortness of breath.  Patient reports that yesterday he had his regular dialysis and they "pulled a lot of fluid off but not enough.  He reports that his ankles are still edematous.  He denies any chest pain however he does report some chest tightness and some shortness of breath.  He denies any palpitations.  He reports compliance with dialysis.  He denies any syncope, orthopnea, paroxysmal nocturnal dyspnea, abdominal pain, nausea vomiting, diarrhea.  Patient does report lower extremity edema.   ED Course: In the ED patient was noted to be in a flutter with RVR.  White count was 12.1, sodium was 132, creatinine was 5.4.  Blood pressure is 104/22.  Patient was started on diltiazem drip.  Chest x-ray showed mild pulmonary vascular congestion.  Nephrology was consulted for dialysis.  Review of Systems: As per HPI otherwise 10 point review of systems negative.   Past Medical History:  Diagnosis Date  . Adenomatous colon polyp    tubular  . Anemia   . Anxiety   . Arthritis    left shoulder  . Atherosclerosis of aorta ([place])   . Cardiomegaly   . Chest pain    DATE UNKNOWN, C/O PERIODICALLY  . Cocaine abuse ([place])   . COPD exacerbation ([place]) 08/17/2016  . Coronary artery disease    stent 02/22/17  . Dialysis patient ([place])    Monday-Wednesday-Friday  . ESRD (end stage renal  disease) on dialysis ([place])    "[name]; MWF" (07/04/2017)  . GERD (gastroesophageal reflux disease)    DATE UNKNOWN  . Hemorrhoids   . Hepatitis B, chronic ([place])   . Hepatitis C   . History of kidney stones   . Hyperkalemia   . Hypertension   . Metabolic bone disease    Patient denies  . Myocardial infarction ([place])   . Pneumonia   . Pulmonary edema   . Renal disorder   . Renal insufficiency   . Shortness of breath dyspnea    " for the last past year with this dialysis"  . Tubular adenoma of colon     Past Surgical History:  Procedure Laterality Date  . A/V FISTULAGRAM Left 05/26/2017   Procedure: A/V FISTULAGRAM;  Surgeon: Conrad [place], MD;  Location: [place] [facility];  Service: Cardiovascular;  Laterality: Left;  . APPLICATION OF WOUND VAC Left 06/14/2017   Procedure: APPLICATION OF WOUND VAC;  Surgeon: Katha Cabal, MD;  Location: [facility];  Service: Vascular;  Laterality: Left;  . AV FISTULA PLACEMENT  2012   BELIEVED WAS PLACED IN JUNE  . AV FISTULA PLACEMENT Right 08/09/2017   Procedure: Creation Right arm ARTERIOVENOUS BRACHIOCEPOHALIC FISTULA;  Surgeon: Elam Dutch, MD;  Location: [place];  Service: Vascular;  Laterality: Right;  . COLONOSCOPY    . CORONARY STENT INTERVENTION N/A 02/22/2017   Procedure: CORONARY STENT INTERVENTION;  Surgeon: Nigel Mormon, MD;  Location: [facility]  INVASIVE [facility];  Service: Cardiovascular;  Laterality: N/A;  . FLEXIBLE SIGMOIDOSCOPY N/A 07/15/2017   Procedure: FLEXIBLE SIGMOIDOSCOPY;  Surgeon: Carol Ada, MD;  Location: [place];  Service: Endoscopy;  Laterality: N/A;  . HEMORRHOID BANDING    . I&D EXTREMITY Left 06/01/2017   Procedure: IRRIGATION AND DEBRIDEMENT LEFT ARM HEMATOMA WITH LIGATION OF LEFT ARM AV FISTULA;  Surgeon: Elam Dutch, MD;  Location: [place];  Service: Vascular;  Laterality: Left;  . I&D EXTREMITY Left 06/14/2017   Procedure: IRRIGATION AND DEBRIDEMENT EXTREMITY;  Surgeon: Katha Cabal,  MD;  Location: [facility];  Service: Vascular;  Laterality: Left;  . INSERTION OF DIALYSIS CATHETER  05/30/2017  . INSERTION OF DIALYSIS CATHETER N/A 05/30/2017   Procedure: INSERTION OF DIALYSIS CATHETER;  Surgeon: Elam Dutch, MD;  Location: [facility];  Service: Vascular;  Laterality: N/A;  . LEFT HEART CATH AND CORONARY ANGIOGRAPHY N/A 02/22/2017   Procedure: LEFT HEART CATH AND CORONARY ANGIOGRAPHY;  Surgeon: Nigel Mormon, MD;  Location: [place] [facility];  Service: Cardiovascular;  Laterality: N/A;  . LIGATION OF ARTERIOVENOUS  FISTULA Left 01/16/2422   Procedure: Plication of Left Arm Arteriovenous Fistula;  Surgeon: Elam Dutch, MD;  Location: [place];  Service: Vascular;  Laterality: Left;  . POLYPECTOMY    . REVISON OF ARTERIOVENOUS FISTULA Left 5/36/1443   Procedure: PLICATION OF DISTAL ANEURYSMAL SEGEMENT OF LEFT UPPER ARM ARTERIOVENOUS FISTULA;  Surgeon: Elam Dutch, MD;  Location: [place];  Service: Vascular;  Laterality: Left;  . REVISON OF ARTERIOVENOUS FISTULA Left 1/54/0086   Procedure: Plication of Left Upper Arm Fistula ;  Surgeon: Waynetta Sandy, MD;  Location: [place];  Service: Vascular;  Laterality: Left;  . SKIN GRAFT SPLIT THICKNESS LEG / FOOT Left    SKIN GRAFT SPLIT THICKNESS LEFT ARM DONOR SITE: LEFT ANTERIOR THIGH  . SKIN SPLIT GRAFT Left 07/04/2017   Procedure: SKIN GRAFT SPLIT THICKNESS LEFT ARM DONOR SITE: LEFT ANTERIOR THIGH;  Surgeon: Elam Dutch, MD;  Location: [place];  Service: Vascular;  Laterality: Left;  . THROMBECTOMY W/ EMBOLECTOMY Left 06/05/2017   Procedure: EXPLORATION OF LEFT ARM FOR BLEEDING; OVERSEWED PROXIMAL FISTULA;  Surgeon: Angelia Mould, MD;  Location: [place];  Service: Vascular;  Laterality: Left;  . WOUND EXPLORATION Left 06/03/2017   Procedure: WOUND EXPLORATION WITH WOUND VAC APPLICATION TO LEFT ARM;  Surgeon: Angelia Mould, MD;  Location: [place];  Service: Vascular;  Laterality: Left;     reports  that he has been smoking cigarettes.  He started smoking about 44 years ago. He has a 21.50 pack-year smoking history. he has never used smokeless tobacco. He reports that he drinks alcohol. He reports that he uses drugs. Drugs: Marijuana and Cocaine.  Allergies  Allergen Reactions  . Aspirin Other (See Comments)    STOMACH PAIN  . Clonidine Derivatives Itching  . Tramadol Itching    Family History  Problem Relation Age of Onset  . Heart disease Mother   . Lung cancer Mother   . Heart disease Father   . Malignant hyperthermia Father   . COPD Father   . Throat cancer Sister   . Esophageal cancer Sister   . Hypertension Other   . COPD Other   . Colon cancer Neg Hx   . Colon polyps Neg Hx   . Rectal cancer Neg Hx   . Stomach cancer Neg Hx      Prior to Admission medications  Medication Sig Start Date End Date Taking? Authorizing Provider  acetaminophen (TYLENOL) 325 MG tablet Take 2 tablets (650 mg total) by mouth every 6 (six) hours as needed for mild pain (or Fever >/= 101). 06/23/17  Yes Shahmehdi, Valeria Batman, MD  albuterol (PROVENTIL HFA) 108 (90 Base) MCG/ACT inhaler Inhale 2 puffs into the lungs every 6 (six) hours as needed for wheezing or shortness of breath. 08/10/17  Yes Parrett, Tammy S, NP  budesonide-formoterol (SYMBICORT) 160-4.5 MCG/ACT inhaler Inhale 2 puffs into the lungs every 12 (twelve) hours.   Yes [provider]  clopidogrel (PLAVIX) 75 MG tablet Take 1 tablet (75 mg total) by mouth daily. 07/15/17  Yes Rhyne, Hulen Shouts, PA-C  diltiazem (CARDIZEM CD) 120 MG 24 hr capsule Take 1 capsule (120 mg total) by mouth daily. 06/23/17 06/23/18 Yes Shahmehdi, Seyed A, MD  gabapentin (NEURONTIN) 100 MG capsule Take 100 mg 2 (two) times daily by mouth.   Yes [provider]  isosorbide mononitrate (IMDUR) 30 MG 24 hr tablet Take 1 tablet (30 mg total) by mouth daily. 06/17/17  Yes [name], MD  Melatonin 5 MG TABS Take 5 mg by mouth at bedtime as needed  (sleep).   Yes [provider]  minoxidil (LONITEN) 2.5 MG tablet Take 1 tablet (2.5 mg total) by mouth 2 (two) times daily. 06/23/17 08/20/17 Yes Shahmehdi, Seyed A, MD  nicotine (NICODERM CQ - DOSED IN MG/24 HOURS) 14 mg/24hr patch Place 14 mg daily onto the skin. 03/30/17  Yes [provider]  oxyCODONE (ROXICODONE) 5 MG immediate release tablet Take 1 tablet (5 mg total) by mouth every 8 (eight) hours as needed for moderate pain. 08/09/17  Yes Rhyne, Hulen Shouts, PA-C  senna (SENOKOT) 8.6 MG TABS tablet Take 1 tablet (8.6 mg total) by mouth daily as needed for mild constipation. 06/10/17  Yes Mariel Aloe, MD  sevelamer carbonate (RENVELA) 800 MG tablet Take 4 tablets (3,200 mg total) by mouth 3 (three) times daily with meals. 06/10/17  Yes Mariel Aloe, MD  tiotropium (SPIRIVA) 18 MCG inhalation capsule Place 1 capsule (18 mcg total) into inhaler and inhale daily. 07/16/17  Yes Dagoberto Ligas, PA-C  zolpidem (AMBIEN) 10 MG tablet Take 1 tablet (10 mg total) by mouth at bedtime as needed for sleep. 06/10/17  Yes Mariel Aloe, MD  albuterol (PROVENTIL) (2.5 MG/3ML) 0.083% nebulizer solution Inhale 3 mLs (2.5 mg total) into the lungs every 4 (four) hours as needed for wheezing or shortness of breath. Patient not taking: Reported on 08/20/2017 07/15/17   Dagoberto Ligas, PA-C  calcitRIOL (ROCALTROL) 0.25 MCG capsule Take 3 capsules (0.75 mcg total) by mouth every Monday, Wednesday, and Friday with hemodialysis. 03/28/17   Cherene Altes, MD  multivitamin (RENA-VIT) TABS tablet Take 1 tablet by mouth at bedtime. Patient not taking: Reported on 08/20/2017 06/17/17   [name], MD  Spacer/Aero-Holding Chambers (AEROCHAMBER MV) inhaler Use as instructed 03/10/17   Javier Glazier, MD  vitamin C (VITAMIN C) 500 MG tablet Take 1 tablet (500 mg total) by mouth 2 (two) times daily. Patient not taking: Reported on 08/20/2017 06/17/17   [name], MD    Physical Exam: Vitals:    08/20/17 1615 08/20/17 1645 08/20/17 1700 08/20/17 1730  BP: 95/79 129/89 127/76 (!) 104/22  Pulse: (!) 131 (!) 134 (!) 130 (!) 127  Resp: 17 (!) 24 17 (!) 22  SpO2: 97% 97% 98% 99%  Weight:      Height:  Constitutional: NAD, calm, comfortable Vitals:   08/20/17 1615 08/20/17 1645 08/20/17 1700 08/20/17 1730  BP: 95/79 129/89 127/76 (!) 104/22  Pulse: (!) 131 (!) 134 (!) 130 (!) 127  Resp: 17 (!) 24 17 (!) 22  SpO2: 97% 97% 98% 99%  Weight:      Height:       Eyes: Anicteric sclera ENMT: Poor dentition Neck: normal Respiratory: c only increased work of breathing, crackles at bases, no wheezing scattered rhonchi Cardiovascular: Tachycardia, regular rate, no murmurs abdomen: no tenderness, no masses palpated. No hepatosplenomegaly. Bowel sounds positive.  Musculoskeletal: On left upper extremity noted to have split-thickness skin graft with bruit and thrill over her old AV graft.  Noted to have a maturing right AV graft Skin: PermCath site is clean dry and intact Neurologic: Moving all extremities Psychiatric: Normal judgment and insight. Alert and oriented x 3. Normal mood.   Labs on Admission: I have personally reviewed following labs and imaging studies  CBC: Recent Labs  Lab 08/20/17 1455 08/20/17 1548  WBC 12.1*  --   NEUTROABS 10.5*  --   HGB 9.6* 11.2*  HCT 28.6* 33.0*  MCV 83.9  --   PLT 202  --    Basic Metabolic Panel: Recent Labs  Lab 08/20/17 1455 08/20/17 1548  NA 132* 132*  K 4.6 4.6  CL 90* 91*  CO2 25  --   GLUCOSE 164* 158*  BUN 32* 34*  CREATININE 5.47* 5.40*  CALCIUM 9.0  --    GFR: Estimated Creatinine Clearance: 15.5 mL/min (A) (by C-G formula based on SCr of 5.4 mg/dL (H)). Liver Function Tests: Recent Labs  Lab 08/20/17 1455  AST 83*  ALT 58  ALKPHOS 163*  BILITOT 1.1  PROT 7.6  ALBUMIN 3.8   No results for input(s): LIPASE, AMYLASE in the last 168 hours. No results for input(s): AMMONIA in the last 168  hours. Coagulation Profile: No results for input(s): INR, PROTIME in the last 168 hours. Cardiac Enzymes: No results for input(s): CKTOTAL, CKMB, CKMBINDEX, TROPONINI in the last 168 hours. BNP (last 3 results) No results for input(s): PROBNP in the last 8760 hours. HbA1C: No results for input(s): HGBA1C in the last 72 hours. CBG: No results for input(s): GLUCAP in the last 168 hours. Lipid Profile: No results for input(s): CHOL, HDL, LDLCALC, TRIG, CHOLHDL, LDLDIRECT in the last 72 hours. Thyroid Function Tests: No results for input(s): TSH, T4TOTAL, FREET4, T3FREE, THYROIDAB in the last 72 hours. Anemia Panel: No results for input(s): VITAMINB12, FOLATE, FERRITIN, TIBC, IRON, RETICCTPCT in the last 72 hours. Urine analysis:    Component Value Date/Time   COLORURINE YELLOW 07/16/2011 1619   APPEARANCEUR CLEAR 07/16/2011 1619   LABSPEC 1.018 07/16/2011 1619   PHURINE 7.5 07/16/2011 1619   GLUCOSEU 250 (A) 07/16/2011 1619   HGBUR MODERATE (A) 07/16/2011 1619   BILIRUBINUR SMALL (A) 07/16/2011 1619   KETONESUR NEGATIVE 07/16/2011 1619   PROTEINUR >300 (A) 07/16/2011 1619   UROBILINOGEN 1.0 07/16/2011 1619   NITRITE NEGATIVE 07/16/2011 1619   LEUKOCYTESUR SMALL (A) 07/16/2011 1619    Radiological Exams on Admission: Dg Chest Port 1 View  Result Date: 08/20/2017 CLINICAL DATA:  Chest pain.  Renal failure EXAM: PORTABLE CHEST 1 VIEW COMPARISON:  July 14, 2017 FINDINGS: Central catheter tip is in the right atrium. No pneumothorax. There is a small left pleural effusion with mild left base atelectasis. Lungs elsewhere are clear. Heart is enlarged with mild pulmonary venous hypertension. There is aortic  atherosclerosis. No adenopathy. No bone lesions. There is calcification in both carotid arteries. There is also calcification in the left and right subclavian arteries. IMPRESSION: Central catheter tip in right atrium. No pneumothorax. Small left pleural effusion with left base  atelectasis. No frank edema or consolidation. There is underlying pulmonary vascular congestion. There is aortic atherosclerosis as well as extensive carotid and subclavian artery calcification. Aortic Atherosclerosis (ICD10-I70.0). Electronically Signed   By: Lowella Grip III M.D.   On: 08/20/2017 15:11    EKG: Independently reviewed.  Atrial flutter with RVR  Assessment/Plan Active Problems:   Chronic hepatitis B (HCC)   Chronic hepatitis C without hepatic coma (HCC)   End stage renal disease (HCC)   Fluid overload   Atrial flutter (HCC)   Anemia   Atrial flutter with rapid ventricular response ([place])  ##) Atrial flutter with RVR: Likely exacerbated by patient's overloaded fluid status.unclear why patient is not on anticoagulation however per review of the chart patient had life-threatening GI bleed several months ago and likely suspect this is the reason.  -Diltiazem drip -Stepdown bed -Monitor and supplement electrolytes line -hold home oral diltiazem 120 mg daily  ##) ESRD T/T/S diaslysis: -Nephrology consult - Continue sevelamer 800 mg -Continue calcitriol  ##) COPD: - Continue long-acting bronchodilator/inhaled cortical steroid -Continue long-acting muscarinic antagonist -PRN albuterol  ##) CAD s/p stents: -Continue clopidogrel 75 mg daily -Hold Imdur 30 mg due to hypotension -Hold minoxidil 2.5 mg twice a day  ##) Hypertension: - Hold antihypertensives  Fluids: Restrict Electrolytes: Monitor and stop Nutrition: Renal diet 1.2 L fluid restricted  Prophylaxis: SCDs  Disposition: Pending heart rate stabilization and further dialysis  Full code   Cristy Folks MD [facility]  If 7PM-7AM, please contact night-coverage [email or web address] Password [facility]  08/20/2017, 6:03 PM

## 2017-08-20 NOTE — ED Notes (Signed)
Gave pt graham crackers and ice water, per Millie - RN.

## 2017-08-20 NOTE — ED Triage Notes (Signed)
Pt arrives EMS from home where he had chest pain that started at 1030 with breakfast.Dialysis yesterday and pt staets all fluid was not taken off. Pt c/o SHOBwith chest pain.c/o tightness

## 2017-08-21 DIAGNOSIS — J449 Chronic obstructive pulmonary disease, unspecified: Secondary | ICD-10-CM | POA: Diagnosis not present

## 2017-08-21 DIAGNOSIS — I4892 Unspecified atrial flutter: Secondary | ICD-10-CM

## 2017-08-21 DIAGNOSIS — N186 End stage renal disease: Secondary | ICD-10-CM | POA: Diagnosis not present

## 2017-08-21 DIAGNOSIS — F17200 Nicotine dependence, unspecified, uncomplicated: Secondary | ICD-10-CM

## 2017-08-21 LAB — CBC
HCT: 28.7 % — ABNORMAL LOW (ref 39.0–52.0)
Hemoglobin: 9.3 g/dL — ABNORMAL LOW (ref 13.0–17.0)
MCH: 27.1 pg (ref 26.0–34.0)
MCHC: 32.4 g/dL (ref 30.0–36.0)
MCV: 83.7 fL (ref 78.0–100.0)
Platelets: 212 10*3/uL (ref 150–400)
RBC: 3.43 MIL/uL — ABNORMAL LOW (ref 4.22–5.81)
RDW: 17.4 % — ABNORMAL HIGH (ref 11.5–15.5)
WBC: 13 10*3/uL — ABNORMAL HIGH (ref 4.0–10.5)

## 2017-08-21 LAB — BASIC METABOLIC PANEL
Anion gap: 17 — ABNORMAL HIGH (ref 5–15)
BUN: 41 mg/dL — ABNORMAL HIGH (ref 6–20)
CO2: 26 mmol/L (ref 22–32)
Calcium: 9.6 mg/dL (ref 8.9–10.3)
Chloride: 87 mmol/L — ABNORMAL LOW (ref 101–111)
Creatinine, Ser: 6.03 mg/dL — ABNORMAL HIGH (ref 0.61–1.24)
GFR calc Af Amer: 11 mL/min — ABNORMAL LOW (ref >60)
GFR calc non Af Amer: 9 mL/min — ABNORMAL LOW (ref >60)
Glucose, Bld: 132 mg/dL — ABNORMAL HIGH (ref 65–99)
Potassium: 5.2 mmol/L — ABNORMAL HIGH (ref 3.5–5.1)
Sodium: 130 mmol/L — ABNORMAL LOW (ref 135–145)

## 2017-08-21 LAB — HIV ANTIBODY (ROUTINE TESTING W REFLEX): HIV Screen 4th Generation wRfx: NONREACTIVE

## 2017-08-21 MED ORDER — DILTIAZEM HCL ER COATED BEADS 120 MG PO CP24
120.0000 mg | ORAL_CAPSULE | Freq: Every day | ORAL | Status: DC
  Administered 2017-08-21: 120 mg via ORAL
  Filled 2017-08-21: qty 1

## 2017-08-21 MED ORDER — DILTIAZEM HCL ER COATED BEADS 120 MG PO CP24: 120.0000 mg | ORAL_CAPSULE | Freq: Every day | ORAL | Status: DC

## 2017-08-21 MED ORDER — HYDROMORPHONE HCL 1 MG/ML IJ SOLN
1.0000 mg | Freq: Once | INTRAMUSCULAR | Status: AC
  Administered 2017-08-21: 1 mg via INTRAVENOUS
  Filled 2017-08-21: qty 1

## 2017-08-21 MED ORDER — HYDROMORPHONE HCL 1 MG/ML IJ SOLN
1.0000 mg | INTRAMUSCULAR | Status: DC | PRN
  Administered 2017-08-21: 1 mg via INTRAVENOUS
  Filled 2017-08-21: qty 1

## 2017-08-21 NOTE — Discharge Summary (Addendum)
Physician Discharge Summary  [name] SWN:[id number] DOB: [birth year] DOA: 08/20/2017  PCP: [name], MD  Admit date: 08/20/2017 Discharge date: 08/21/2017  Admitted From: home Disposition:  home        Discharge Condition:  stable  CODE STATUS:  Full code   Consultations:  Nephrology    Discharge Diagnoses:  Principal Problem:   Atrial flutter with rapid ventricular response (HCC) Active Problems:   End stage renal disease (HCC)   Fluid overload   Tobacco use disorder   Chronic hepatitis B ([place])   Chronic hepatitis C without hepatic coma ([place])   COPD GOLD II/ still smoking   Anemia     Brief Narrative:  [name] is a 57 y.o. male with medical history significant of ESRD on Tuesday Thursday Saturday dialysis status post left AV fistula complicated by fistula infection status post skin graft, status post PermCath placement and right chest status post right AV fistula pending maturity, chronic atrial flutter/fibrillation not on anticoagulation, hepatitis B and hepatitis C, COPD, cocaine abuse who comes in with edema and some shortness of breath.  He was found to have A-fib with RVR and was started on a Cardizem infusion in the ER.    Subjective: He has no chest pain, palpitations or dyspnea.  ROS: no complaints of nausea, vomiting, constipation diarrhea, cough, dyspnea or dysuria. No other complaints.   Assessment & Plan:   Principal Problem:   Atrial flutter with rapid ventricular response -Rate is now well controlled - he is off Cardizem infusion and back on oral Cardizem -He is not on warfarin due to noncompliance-he was also admitted for rectal bleed recently-will remain off of Coumadin for now until it can be determined that he is more reliable to take his medication appropriately  Active Problems:   End stage renal disease    -his next dialysis as outpatient is tomorrow- nephrology feels he is stable - I have advised him to  avoid drinking to much fluids today     COPD GOLD II/ still smoking - COPD is stable - have advised to stop smoking    Anemia - follow  Chronically elevated WBC counts - noted on blood work review in Dentist- outpt work up for this  Chronic pain - goes to pain management clinic for Oxycodones    Chronic hepatitis B     Chronic hepatitis C without hepatic coma     Discharge Exam: Vitals:   08/21/17 0946 08/21/17 0947  BP:    Pulse:    Resp:    Temp:    SpO2: 95% 100%   Vitals:   08/21/17 0431 08/21/17 0824 08/21/17 0946 08/21/17 0947  BP: 101/78 112/75    Pulse: (!) 114 98    Resp: (!) 24 (!) 23    Temp: 98.1 F (36.7 C)     TempSrc: Oral     SpO2: 99% 96% 95% 100%  Weight: 79.5 kg (175 lb 3.2 oz)     Height:        General: Pt is alert, awake, not in acute distress Cardiovascular: IIRR, S1/S2 +, no rubs, no gallops Respiratory: CTA bilaterally, no wheezing, no rhonchi Abdominal: Soft, NT, ND, bowel sounds + Extremities: no edema, no cyanosis   Discharge Instructions  Discharge Instructions    Diet general   Complete by:  As directed    Renal heart healthy diet   Increase activity slowly   Complete by:  As directed  Allergies as of 08/21/2017      Reactions   Aspirin Other (See Comments)   STOMACH PAIN   Clonidine Derivatives Itching   Tramadol Itching      Medication List    TAKE these medications   acetaminophen 325 MG tablet Commonly known as:  TYLENOL Take 2 tablets (650 mg total) by mouth every 6 (six) hours as needed for mild pain (or Fever >/= 101).   AEROCHAMBER MV inhaler Use as instructed   albuterol (2.5 MG/3ML) 0.083% nebulizer solution Commonly known as:  PROVENTIL Inhale 3 mLs (2.5 mg total) into the lungs every 4 (four) hours as needed for wheezing or shortness of breath.   albuterol 108 (90 Base) MCG/ACT inhaler Commonly known as:  PROVENTIL HFA Inhale 2 puffs into the lungs every 6 (six) hours as needed for wheezing or  shortness of breath.   ascorbic acid 500 MG tablet Commonly known as:  VITAMIN C Take 1 tablet (500 mg total) by mouth 2 (two) times daily.   budesonide-formoterol 160-4.5 MCG/ACT inhaler Commonly known as:  SYMBICORT Inhale 2 puffs into the lungs every 12 (twelve) hours.   calcitRIOL 0.25 MCG capsule Commonly known as:  ROCALTROL Take 3 capsules (0.75 mcg total) by mouth every Monday, Wednesday, and Friday with hemodialysis.   clopidogrel 75 MG tablet Commonly known as:  PLAVIX Take 1 tablet (75 mg total) by mouth daily.   diltiazem 120 MG 24 hr capsule Commonly known as:  CARDIZEM CD Take 1 capsule (120 mg total) by mouth daily.   gabapentin 100 MG capsule Commonly known as:  NEURONTIN Take 100 mg 2 (two) times daily by mouth.   isosorbide mononitrate 30 MG 24 hr tablet Commonly known as:  IMDUR Take 1 tablet (30 mg total) by mouth daily.   Melatonin 5 MG Tabs Take 5 mg by mouth at bedtime as needed (sleep).   minoxidil 2.5 MG tablet Commonly known as:  LONITEN Take 1 tablet (2.5 mg total) by mouth 2 (two) times daily.   multivitamin Tabs tablet Take 1 tablet by mouth at bedtime.   nicotine 14 mg/24hr patch Commonly known as:  NICODERM CQ - dosed in mg/24 hours Place 14 mg daily onto the skin.   oxyCODONE 5 MG immediate release tablet Commonly known as:  ROXICODONE Take 1 tablet (5 mg total) by mouth every 8 (eight) hours as needed for moderate pain.   senna 8.6 MG Tabs tablet Commonly known as:  SENOKOT Take 1 tablet (8.6 mg total) by mouth daily as needed for mild constipation.   sevelamer carbonate 800 MG tablet Commonly known as:  RENVELA Take 4 tablets (3,200 mg total) by mouth 3 (three) times daily with meals.   tiotropium 18 MCG inhalation capsule Commonly known as:  SPIRIVA Place 1 capsule (18 mcg total) into inhaler and inhale daily.   zolpidem 10 MG tablet Commonly known as:  AMBIEN Take 1 tablet (10 mg total) by mouth at bedtime as needed for  sleep.       Allergies  Allergen Reactions  . Aspirin Other (See Comments)    STOMACH PAIN  . Clonidine Derivatives Itching  . Tramadol Itching     Procedures/Studies:    Dg Chest Port 1 View  Result Date: 08/20/2017 CLINICAL DATA:  Chest pain.  Renal failure EXAM: PORTABLE CHEST 1 VIEW COMPARISON:  July 14, 2017 FINDINGS: Central catheter tip is in the right atrium. No pneumothorax. There is a small left pleural effusion with mild left base  atelectasis. Lungs elsewhere are clear. Heart is enlarged with mild pulmonary venous hypertension. There is aortic atherosclerosis. No adenopathy. No bone lesions. There is calcification in both carotid arteries. There is also calcification in the left and right subclavian arteries. IMPRESSION: Central catheter tip in right atrium. No pneumothorax. Small left pleural effusion with left base atelectasis. No frank edema or consolidation. There is underlying pulmonary vascular congestion. There is aortic atherosclerosis as well as extensive carotid and subclavian artery calcification. Aortic Atherosclerosis (ICD10-I70.0). Electronically Signed   By: Lowella Grip III M.D.   On: 08/20/2017 15:11     The results of significant diagnostics from this hospitalization (including imaging, microbiology, ancillary and laboratory) are listed below for reference.     Microbiology: Recent Results (from the past 240 hour(s))  MRSA PCR Screening     Status: None   Collection Time: 08/20/17  9:08 PM  Result Value Ref Range Status   MRSA by PCR NEGATIVE NEGATIVE Final    Comment:        The GeneXpert MRSA Assay (FDA approved for NASAL specimens only), is one component of a comprehensive MRSA colonization surveillance program. It is not intended to diagnose MRSA infection nor to guide or monitor treatment for MRSA infections. Performed at [facility], [place] [address] [place], [place] [address]      Labs: BNP (last 3 results) Recent Labs     03/24/17 2050 07/08/17 1959 08/20/17 1455  BNP 1,062.0* 1,100.4* 7,017.7*   Basic Metabolic Panel: Recent Labs  Lab 08/20/17 1455 08/20/17 1548 08/21/17 0422  NA 132* 132* 130*  K 4.6 4.6 5.2*  CL 90* 91* 87*  CO2 25  --  26  GLUCOSE 164* 158* 132*  BUN 32* 34* 41*  CREATININE 5.47* 5.40* 6.03*  CALCIUM 9.0  --  9.6   Liver Function Tests: Recent Labs  Lab 08/20/17 1455  AST 83*  ALT 58  ALKPHOS 163*  BILITOT 1.1  PROT 7.6  ALBUMIN 3.8   No results for input(s): LIPASE, AMYLASE in the last 168 hours. No results for input(s): AMMONIA in the last 168 hours. CBC: Recent Labs  Lab 08/20/17 1455 08/20/17 1548 08/21/17 0422  WBC 12.1*  --  13.0*  NEUTROABS 10.5*  --   --   HGB 9.6* 11.2* 9.3*  HCT 28.6* 33.0* 28.7*  MCV 83.9  --  83.7  PLT 202  --  212   Cardiac Enzymes: No results for input(s): CKTOTAL, CKMB, CKMBINDEX, TROPONINI in the last 168 hours. BNP: Invalid input(s): POCBNP CBG: No results for input(s): GLUCAP in the last 168 hours. D-Dimer No results for input(s): DDIMER in the last 72 hours. Hgb A1c No results for input(s): HGBA1C in the last 72 hours. Lipid Profile No results for input(s): CHOL, HDL, LDLCALC, TRIG, CHOLHDL, LDLDIRECT in the last 72 hours. Thyroid function studies No results for input(s): TSH, T4TOTAL, T3FREE, THYROIDAB in the last 72 hours.  Invalid input(s): FREET3 Anemia work up No results for input(s): VITAMINB12, FOLATE, FERRITIN, TIBC, IRON, RETICCTPCT in the last 72 hours. Urinalysis    Component Value Date/Time   COLORURINE YELLOW 07/16/2011 1619   APPEARANCEUR CLEAR 07/16/2011 1619   LABSPEC 1.018 07/16/2011 1619   PHURINE 7.5 07/16/2011 1619   GLUCOSEU 250 (A) 07/16/2011 1619   HGBUR MODERATE (A) 07/16/2011 1619   BILIRUBINUR SMALL (A) 07/16/2011 1619   KETONESUR NEGATIVE 07/16/2011 1619   PROTEINUR >300 (A) 07/16/2011 1619   UROBILINOGEN 1.0 07/16/2011 1619   NITRITE  NEGATIVE 07/16/2011 1619   LEUKOCYTESUR  SMALL (A) 07/16/2011 1619   Sepsis Labs Invalid input(s): PROCALCITONIN,  WBC,  LACTICIDVEN Microbiology Recent Results (from the past 240 hour(s))  MRSA PCR Screening     Status: None   Collection Time: 08/20/17  9:08 PM  Result Value Ref Range Status   MRSA by PCR NEGATIVE NEGATIVE Final    Comment:        The GeneXpert MRSA Assay (FDA approved for NASAL specimens only), is one component of a comprehensive MRSA colonization surveillance program. It is not intended to diagnose MRSA infection nor to guide or monitor treatment for MRSA infections. Performed at [facility], [place] [address] [place], [place] [address]      Time coordinating discharge: Over 30 minutes  SIGNED:   Debbe Odea, MD  [facility] 08/21/2017, 11:39 AM Pager   If 7PM-7AM, please contact night-coverage [email or web address] Password [email or web address]

## 2017-08-21 NOTE — Care Management CC44 (Signed)
Condition Code 44 Documentation Completed  Patient Details  Name: [name] MRN: [id number] Date of Birth: 1960/08/06   Condition Code 44 given:  Yes Patient signature on Condition Code 44 notice:  Yes Documentation of 2 MD's agreement:  Yes Code 44 added to claim:  Yes    [name], RN 08/21/2017, 12:18 PM

## 2017-08-21 NOTE — Progress Notes (Signed)
Pt c/o of pain 9/10. MD notified. New orders placed. Will continue to monitor. Isac Caddy, RN

## 2017-08-21 NOTE — Progress Notes (Signed)
Pt discharged home with sister. IV and telemetry box removed. Pt discharged home with right chest dialysis catheter, which was capped. Pt received discharge instructions, however, pt refused to take his discharge paperwork home with him. Pt left with all belongings. Pt left via wheelchair and was accompanied by pt's nurse tech.   Grant Fontana BSN, RN

## 2017-08-21 NOTE — Care Management Obs Status (Signed)
[facility] NOTIFICATION   Patient Details  Name: [name] MRN: [id number] Date of Birth: January 05, 1961   Medicare Observation Status Notification Given:  Yes    [name], RN 08/21/2017, 12:18 PM

## 2017-08-22 DIAGNOSIS — D509 Iron deficiency anemia, unspecified: Secondary | ICD-10-CM | POA: Diagnosis not present

## 2017-08-22 DIAGNOSIS — R51 Headache: Secondary | ICD-10-CM | POA: Diagnosis not present

## 2017-08-22 DIAGNOSIS — N2581 Secondary hyperparathyroidism of renal origin: Secondary | ICD-10-CM | POA: Diagnosis not present

## 2017-08-22 DIAGNOSIS — L299 Pruritus, unspecified: Secondary | ICD-10-CM | POA: Diagnosis not present

## 2017-08-22 DIAGNOSIS — D631 Anemia in chronic kidney disease: Secondary | ICD-10-CM | POA: Diagnosis not present

## 2017-08-22 DIAGNOSIS — D689 Coagulation defect, unspecified: Secondary | ICD-10-CM | POA: Diagnosis not present

## 2017-08-22 DIAGNOSIS — N186 End stage renal disease: Secondary | ICD-10-CM | POA: Diagnosis not present

## 2017-08-22 DIAGNOSIS — I4891 Unspecified atrial fibrillation: Secondary | ICD-10-CM | POA: Diagnosis not present

## 2017-08-23 DIAGNOSIS — N2581 Secondary hyperparathyroidism of renal origin: Secondary | ICD-10-CM | POA: Diagnosis not present

## 2017-08-23 DIAGNOSIS — N186 End stage renal disease: Secondary | ICD-10-CM | POA: Diagnosis not present

## 2017-08-23 DIAGNOSIS — E8770 Fluid overload, unspecified: Secondary | ICD-10-CM | POA: Diagnosis not present

## 2017-08-26 DIAGNOSIS — D631 Anemia in chronic kidney disease: Secondary | ICD-10-CM | POA: Diagnosis not present

## 2017-08-26 DIAGNOSIS — I4891 Unspecified atrial fibrillation: Secondary | ICD-10-CM | POA: Diagnosis not present

## 2017-08-26 DIAGNOSIS — N186 End stage renal disease: Secondary | ICD-10-CM | POA: Diagnosis not present

## 2017-08-26 DIAGNOSIS — D509 Iron deficiency anemia, unspecified: Secondary | ICD-10-CM | POA: Diagnosis not present

## 2017-08-26 DIAGNOSIS — N2581 Secondary hyperparathyroidism of renal origin: Secondary | ICD-10-CM | POA: Diagnosis not present

## 2017-08-26 DIAGNOSIS — L299 Pruritus, unspecified: Secondary | ICD-10-CM | POA: Diagnosis not present

## 2017-08-26 DIAGNOSIS — D689 Coagulation defect, unspecified: Secondary | ICD-10-CM | POA: Diagnosis not present

## 2017-08-26 DIAGNOSIS — R51 Headache: Secondary | ICD-10-CM | POA: Diagnosis not present

## 2017-08-27 DIAGNOSIS — D509 Iron deficiency anemia, unspecified: Secondary | ICD-10-CM | POA: Diagnosis not present

## 2017-08-27 DIAGNOSIS — R51 Headache: Secondary | ICD-10-CM | POA: Diagnosis not present

## 2017-08-27 DIAGNOSIS — L299 Pruritus, unspecified: Secondary | ICD-10-CM | POA: Diagnosis not present

## 2017-08-27 DIAGNOSIS — N186 End stage renal disease: Secondary | ICD-10-CM | POA: Diagnosis not present

## 2017-08-27 DIAGNOSIS — N2581 Secondary hyperparathyroidism of renal origin: Secondary | ICD-10-CM | POA: Diagnosis not present

## 2017-08-27 DIAGNOSIS — D689 Coagulation defect, unspecified: Secondary | ICD-10-CM | POA: Diagnosis not present

## 2017-08-27 DIAGNOSIS — I4891 Unspecified atrial fibrillation: Secondary | ICD-10-CM | POA: Diagnosis not present

## 2017-08-27 DIAGNOSIS — D631 Anemia in chronic kidney disease: Secondary | ICD-10-CM | POA: Diagnosis not present

## 2017-08-27 DIAGNOSIS — E8770 Fluid overload, unspecified: Secondary | ICD-10-CM | POA: Diagnosis not present

## 2017-08-29 ENCOUNTER — Encounter (HOSPITAL_COMMUNITY): Payer: Self-pay | Admitting: Emergency Medicine

## 2017-08-29 ENCOUNTER — Observation Stay (HOSPITAL_COMMUNITY)
Admission: EM | Admit: 2017-08-29 | Discharge: 2017-08-30 | Discharge status: Against Medical Advice | Payer: Medicare Other | Attending: Internal Medicine | Admitting: Internal Medicine

## 2017-08-29 ENCOUNTER — Other Ambulatory Visit: Payer: Self-pay

## 2017-08-29 ENCOUNTER — Other Ambulatory Visit (HOSPITAL_COMMUNITY): Payer: Self-pay

## 2017-08-29 ENCOUNTER — Emergency Department (HOSPITAL_COMMUNITY): Payer: Medicare Other

## 2017-08-29 DIAGNOSIS — E1122 Type 2 diabetes mellitus with diabetic chronic kidney disease: Secondary | ICD-10-CM | POA: Insufficient documentation

## 2017-08-29 DIAGNOSIS — I050 Rheumatic mitral stenosis: Secondary | ICD-10-CM | POA: Diagnosis not present

## 2017-08-29 DIAGNOSIS — Z7902 Long term (current) use of antithrombotics/antiplatelets: Secondary | ICD-10-CM | POA: Diagnosis not present

## 2017-08-29 DIAGNOSIS — Z955 Presence of coronary angioplasty implant and graft: Secondary | ICD-10-CM | POA: Diagnosis not present

## 2017-08-29 DIAGNOSIS — J449 Chronic obstructive pulmonary disease, unspecified: Secondary | ICD-10-CM | POA: Diagnosis not present

## 2017-08-29 DIAGNOSIS — Z5321 Procedure and treatment not carried out due to patient leaving prior to being seen by health care provider: Secondary | ICD-10-CM | POA: Diagnosis not present

## 2017-08-29 DIAGNOSIS — I700 Atherosclerosis of aorta: Secondary | ICD-10-CM | POA: Insufficient documentation

## 2017-08-29 DIAGNOSIS — R079 Chest pain, unspecified: Secondary | ICD-10-CM | POA: Diagnosis present

## 2017-08-29 DIAGNOSIS — F17210 Nicotine dependence, cigarettes, uncomplicated: Secondary | ICD-10-CM | POA: Diagnosis not present

## 2017-08-29 DIAGNOSIS — N189 Chronic kidney disease, unspecified: Secondary | ICD-10-CM | POA: Diagnosis present

## 2017-08-29 DIAGNOSIS — Z992 Dependence on renal dialysis: Secondary | ICD-10-CM | POA: Insufficient documentation

## 2017-08-29 DIAGNOSIS — I1310 Hypertensive heart and chronic kidney disease without heart failure, with stage 1 through stage 4 chronic kidney disease, or unspecified chronic kidney disease: Secondary | ICD-10-CM | POA: Insufficient documentation

## 2017-08-29 DIAGNOSIS — R072 Precordial pain: Secondary | ICD-10-CM | POA: Diagnosis not present

## 2017-08-29 DIAGNOSIS — Z8249 Family history of ischemic heart disease and other diseases of the circulatory system: Secondary | ICD-10-CM | POA: Insufficient documentation

## 2017-08-29 DIAGNOSIS — R0602 Shortness of breath: Secondary | ICD-10-CM | POA: Diagnosis not present

## 2017-08-29 DIAGNOSIS — N186 End stage renal disease: Secondary | ICD-10-CM | POA: Diagnosis not present

## 2017-08-29 DIAGNOSIS — Z7951 Long term (current) use of inhaled steroids: Secondary | ICD-10-CM | POA: Insufficient documentation

## 2017-08-29 DIAGNOSIS — D689 Coagulation defect, unspecified: Secondary | ICD-10-CM | POA: Diagnosis not present

## 2017-08-29 DIAGNOSIS — R0600 Dyspnea, unspecified: Secondary | ICD-10-CM

## 2017-08-29 DIAGNOSIS — I447 Left bundle-branch block, unspecified: Secondary | ICD-10-CM | POA: Insufficient documentation

## 2017-08-29 DIAGNOSIS — R188 Other ascites: Secondary | ICD-10-CM | POA: Diagnosis present

## 2017-08-29 DIAGNOSIS — I4891 Unspecified atrial fibrillation: Secondary | ICD-10-CM | POA: Diagnosis not present

## 2017-08-29 DIAGNOSIS — Z79899 Other long term (current) drug therapy: Secondary | ICD-10-CM | POA: Insufficient documentation

## 2017-08-29 DIAGNOSIS — D631 Anemia in chronic kidney disease: Secondary | ICD-10-CM | POA: Diagnosis present

## 2017-08-29 DIAGNOSIS — I2510 Atherosclerotic heart disease of native coronary artery without angina pectoris: Secondary | ICD-10-CM | POA: Insufficient documentation

## 2017-08-29 DIAGNOSIS — N2581 Secondary hyperparathyroidism of renal origin: Secondary | ICD-10-CM | POA: Diagnosis not present

## 2017-08-29 DIAGNOSIS — D509 Iron deficiency anemia, unspecified: Secondary | ICD-10-CM | POA: Diagnosis not present

## 2017-08-29 DIAGNOSIS — I313 Pericardial effusion (noninflammatory): Secondary | ICD-10-CM | POA: Insufficient documentation

## 2017-08-29 DIAGNOSIS — R140 Abdominal distension (gaseous): Secondary | ICD-10-CM | POA: Diagnosis not present

## 2017-08-29 DIAGNOSIS — R51 Headache: Secondary | ICD-10-CM | POA: Diagnosis not present

## 2017-08-29 DIAGNOSIS — I252 Old myocardial infarction: Secondary | ICD-10-CM | POA: Insufficient documentation

## 2017-08-29 DIAGNOSIS — L299 Pruritus, unspecified: Secondary | ICD-10-CM | POA: Diagnosis not present

## 2017-08-29 DIAGNOSIS — R069 Unspecified abnormalities of breathing: Secondary | ICD-10-CM | POA: Diagnosis not present

## 2017-08-29 LAB — CBC WITH DIFFERENTIAL/PLATELET
Basophils Absolute: 0 10*3/uL (ref 0.0–0.1)
Basophils Relative: 0 %
Eosinophils Absolute: 0.1 10*3/uL (ref 0.0–0.7)
Eosinophils Relative: 1 %
HCT: 26.7 % — ABNORMAL LOW (ref 39.0–52.0)
Hemoglobin: 9.2 g/dL — ABNORMAL LOW (ref 13.0–17.0)
Lymphocytes Relative: 17 %
Lymphs Abs: 1.4 10*3/uL (ref 0.7–4.0)
MCH: 28.5 pg (ref 26.0–34.0)
MCHC: 34.5 g/dL (ref 30.0–36.0)
MCV: 82.7 fL (ref 78.0–100.0)
Monocytes Absolute: 0.5 10*3/uL (ref 0.1–1.0)
Monocytes Relative: 6 %
Neutro Abs: 6.2 10*3/uL (ref 1.7–7.7)
Neutrophils Relative %: 76 %
Platelets: 121 10*3/uL — ABNORMAL LOW (ref 150–400)
RBC: 3.23 MIL/uL — ABNORMAL LOW (ref 4.22–5.81)
RDW: 18.5 % — ABNORMAL HIGH (ref 11.5–15.5)
WBC: 8.3 10*3/uL (ref 4.0–10.5)

## 2017-08-29 LAB — I-STAT TROPONIN, ED
Troponin i, poc: 0.04 ng/mL (ref 0.00–0.08)
Troponin i, poc: 0.04 ng/mL (ref 0.00–0.08)

## 2017-08-29 LAB — COMPREHENSIVE METABOLIC PANEL
ALT: 36 U/L (ref 17–63)
AST: 28 U/L (ref 15–41)
Albumin: 3.5 g/dL (ref 3.5–5.0)
Alkaline Phosphatase: 134 U/L — ABNORMAL HIGH (ref 38–126)
Anion gap: 15 (ref 5–15)
BUN: 27 mg/dL — ABNORMAL HIGH (ref 6–20)
CO2: 28 mmol/L (ref 22–32)
Calcium: 8.1 mg/dL — ABNORMAL LOW (ref 8.9–10.3)
Chloride: 94 mmol/L — ABNORMAL LOW (ref 101–111)
Creatinine, Ser: 3.97 mg/dL — ABNORMAL HIGH (ref 0.61–1.24)
GFR calc Af Amer: 18 mL/min — ABNORMAL LOW (ref >60)
GFR calc non Af Amer: 16 mL/min — ABNORMAL LOW (ref >60)
Glucose, Bld: 115 mg/dL — ABNORMAL HIGH (ref 65–99)
Potassium: 3.7 mmol/L (ref 3.5–5.1)
Sodium: 137 mmol/L (ref 135–145)
Total Bilirubin: 0.7 mg/dL (ref 0.3–1.2)
Total Protein: 6.7 g/dL (ref 6.5–8.1)

## 2017-08-29 LAB — TROPONIN I
Troponin I: 0.07 ng/mL (ref <0.03)
Troponin I: 0.07 ng/mL (ref <0.03)

## 2017-08-29 LAB — PROTIME-INR
INR: 1.12
Prothrombin Time: 14.3 seconds (ref 11.4–15.2)

## 2017-08-29 LAB — BRAIN NATRIURETIC PEPTIDE: B Natriuretic Peptide: 943.6 pg/mL — ABNORMAL HIGH (ref 0.0–100.0)

## 2017-08-29 MED ORDER — RENA-VITE PO TABS
1.0000 | ORAL_TABLET | Freq: Every day | ORAL | Status: DC
  Administered 2017-08-29: 1 via ORAL
  Filled 2017-08-29: qty 1

## 2017-08-29 MED ORDER — GI COCKTAIL ~~LOC~~: 30.0000 mL | Freq: Four times a day (QID) | ORAL | Status: DC | PRN

## 2017-08-29 MED ORDER — GABAPENTIN 100 MG PO CAPS
100.0000 mg | ORAL_CAPSULE | Freq: Two times a day (BID) | ORAL | Status: DC
  Administered 2017-08-29: 100 mg via ORAL
  Filled 2017-08-29: qty 1

## 2017-08-29 MED ORDER — ONDANSETRON HCL 4 MG/2ML IJ SOLN: 4.0000 mg | Freq: Four times a day (QID) | INTRAMUSCULAR | Status: DC | PRN

## 2017-08-29 MED ORDER — SEVELAMER CARBONATE 800 MG PO TABS
3200.0000 mg | ORAL_TABLET | Freq: Three times a day (TID) | ORAL | Status: DC
  Filled 2017-08-29: qty 4

## 2017-08-29 MED ORDER — ALBUTEROL SULFATE HFA 108 (90 BASE) MCG/ACT IN AERS: 2.0000 | INHALATION_SPRAY | Freq: Four times a day (QID) | RESPIRATORY_TRACT | Status: DC | PRN

## 2017-08-29 MED ORDER — IPRATROPIUM-ALBUTEROL 0.5-2.5 (3) MG/3ML IN SOLN: 3.0000 mL | Freq: Four times a day (QID) | RESPIRATORY_TRACT | Status: DC | PRN

## 2017-08-29 MED ORDER — OXYCODONE HCL 5 MG PO TABS
5.0000 mg | ORAL_TABLET | Freq: Three times a day (TID) | ORAL | Status: DC | PRN
  Administered 2017-08-29: 5 mg via ORAL
  Filled 2017-08-29: qty 1

## 2017-08-29 MED ORDER — SENNA 8.6 MG PO TABS
1.0000 | ORAL_TABLET | Freq: Every day | ORAL | Status: DC | PRN
  Filled 2017-08-29: qty 1

## 2017-08-29 MED ORDER — HEPARIN SODIUM (PORCINE) 5000 UNIT/ML IJ SOLN: 5000.0000 [IU] | Freq: Three times a day (TID) | INTRAMUSCULAR | Status: DC

## 2017-08-29 MED ORDER — ALBUTEROL SULFATE (2.5 MG/3ML) 0.083% IN NEBU: 2.5000 mg | INHALATION_SOLUTION | Freq: Four times a day (QID) | RESPIRATORY_TRACT | Status: DC | PRN

## 2017-08-29 MED ORDER — NICOTINE 14 MG/24HR TD PT24
14.0000 mg | MEDICATED_PATCH | Freq: Every day | TRANSDERMAL | Status: DC
  Administered 2017-08-29: 14 mg via TRANSDERMAL
  Filled 2017-08-29: qty 1

## 2017-08-29 MED ORDER — MINOXIDIL 2.5 MG PO TABS
2.5000 mg | ORAL_TABLET | Freq: Two times a day (BID) | ORAL | Status: DC
  Filled 2017-08-29 (×2): qty 1

## 2017-08-29 MED ORDER — CALCITRIOL 0.5 MCG PO CAPS: 0.7500 ug | ORAL_CAPSULE | ORAL | Status: DC

## 2017-08-29 MED ORDER — ACETAMINOPHEN 325 MG PO TABS: 650.0000 mg | ORAL_TABLET | ORAL | Status: DC | PRN

## 2017-08-29 MED ORDER — CLOPIDOGREL BISULFATE 75 MG PO TABS
75.0000 mg | ORAL_TABLET | Freq: Every day | ORAL | Status: DC
  Administered 2017-08-29: 75 mg via ORAL
  Filled 2017-08-29: qty 1

## 2017-08-29 MED ORDER — MELATONIN 5 MG PO TABS: 5.0000 mg | ORAL_TABLET | Freq: Every evening | ORAL | Status: DC | PRN

## 2017-08-29 MED ORDER — ISOSORBIDE MONONITRATE ER 30 MG PO TB24: 30.0000 mg | ORAL_TABLET | Freq: Every day | ORAL | Status: DC

## 2017-08-29 MED ORDER — MOMETASONE FURO-FORMOTEROL FUM 200-5 MCG/ACT IN AERO: 2.0000 | INHALATION_SPRAY | Freq: Two times a day (BID) | RESPIRATORY_TRACT | Status: DC

## 2017-08-29 MED ORDER — MORPHINE SULFATE (PF) 4 MG/ML IV SOLN: 2.0000 mg | INTRAVENOUS | Status: DC | PRN

## 2017-08-29 MED ORDER — DILTIAZEM HCL ER COATED BEADS 120 MG PO CP24
120.0000 mg | ORAL_CAPSULE | Freq: Every day | ORAL | Status: DC
  Filled 2017-08-29: qty 1

## 2017-08-29 MED ORDER — ACETAMINOPHEN 325 MG PO TABS: 650.0000 mg | ORAL_TABLET | Freq: Four times a day (QID) | ORAL | Status: DC | PRN

## 2017-08-29 MED ORDER — MELATONIN 3 MG PO TABS
6.0000 mg | ORAL_TABLET | Freq: Every evening | ORAL | Status: DC | PRN
  Administered 2017-08-29: 6 mg via ORAL
  Filled 2017-08-29 (×2): qty 2

## 2017-08-29 MED ORDER — HEPARIN SODIUM (PORCINE) 5000 UNIT/ML IJ SOLN
5000.0000 [IU] | Freq: Three times a day (TID) | INTRAMUSCULAR | Status: DC
  Filled 2017-08-29: qty 1

## 2017-08-29 NOTE — ED Notes (Signed)
Admitting physician in to see patient, he also informed MD he does not want to wear his monitor.

## 2017-08-29 NOTE — H&P (Addendum)
History and Physical    [name] VVO:[id number] DOB: 1961/04/13 DOA: 08/29/2017  PCP: [name], MD Consultants: none Patient coming from: Home - alone   Chief Complaint:chest pain  HPI: [name] is a 57 y.o. male with medical history significant of ESRD on Monday, Wednesday and Friday dialysis status post left AV fistula complicated by fistula infection status post skin graft, status post PermCath placement and right chest status post right AV fistula pending maturity, chronic atrial flutter/fibrillation not on anticoagulation, hepatitis B and hepatitis C, COPD who comes in with complaints of chest pain and shortness of breath.  Patient was at hemodialysis today when he started experiencing chest symptoms at the end of dialysis session and it was reduced by 40 minutes.  He called EMS and was delivered to [facility]. He also complained of abdominal fullness and distention that has been going on for a while, but denied nausea vomiting or diarrhea.   ED Course: On arrival his vital signs revealed blood pressure of 135 4 1/104, respirations 21 pulse 94, he was afebrile. EKG reveals sinus rhythm with left bundle branch block, which is not new Blood work demonstrated flat troponin of 0.04 x 2 and BNP of 943.  Last BNP was greater than 1400 on 08/20/2017, but troponin remained the same at the day Creatinine was 3.97 and BUN 27 after hemodialysis Globin remained stable 9.2 and the same was a week ago. Chest x-ray without acute abnormality ED provider performed abdominal ultrasound and found a moderate amount of ascites  Review of Systems: As per HPI; otherwise review of systems reviewed and negative.   Ambulatory Status: Ambulates without assistance  Past Medical History:  Diagnosis Date  . Adenomatous colon polyp    tubular  . Anemia   . Anxiety   . Arthritis    left shoulder  . Atherosclerosis of aorta ([place])   . Cardiomegaly   . Chest pain    DATE UNKNOWN, C/O PERIODICALLY  . Cocaine abuse ([place])   . COPD exacerbation ([place]) 08/17/2016  . Coronary artery disease    stent 02/22/17  . Dialysis patient ([place])    Monday-Wednesday-Friday  . ESRD (end stage renal disease) on dialysis ([place])    "[name]; MWF" (07/04/2017)  . GERD (gastroesophageal reflux disease)    DATE UNKNOWN  . Hemorrhoids   . Hepatitis B, chronic ([place])   . Hepatitis C   . History of kidney stones   . Hyperkalemia   . Hypertension   . Metabolic bone disease    Patient denies  . Myocardial infarction ([place])   . Pneumonia   . Pulmonary edema   . Renal disorder   . Renal insufficiency   . Shortness of breath dyspnea    " for the last past year with this dialysis"  . Tubular adenoma of colon     Past Surgical History:  Procedure Laterality Date  . A/V FISTULAGRAM Left 05/26/2017   Procedure: A/V FISTULAGRAM;  Surgeon: Conrad Allentown, MD;  Location: [place] [facility];  Service: Cardiovascular;  Laterality: Left;  . APPLICATION OF WOUND VAC Left 06/14/2017   Procedure: APPLICATION OF WOUND VAC;  Surgeon: Katha Cabal, MD;  Location: [facility];  Service: Vascular;  Laterality: Left;  . AV FISTULA PLACEMENT  2012   BELIEVED WAS PLACED IN JUNE  . AV FISTULA PLACEMENT Right 08/09/2017   Procedure: Creation Right arm ARTERIOVENOUS BRACHIOCEPOHALIC FISTULA;  Surgeon: Elam Dutch, MD;  Location: [place];  Service: Vascular;  Laterality: Right;  . COLONOSCOPY    . CORONARY STENT INTERVENTION N/A 02/22/2017   Procedure: CORONARY STENT INTERVENTION;  Surgeon: Nigel Mormon, MD;  Location: [place] [facility];  Service: Cardiovascular;  Laterality: N/A;  . FLEXIBLE SIGMOIDOSCOPY N/A 07/15/2017   Procedure: FLEXIBLE SIGMOIDOSCOPY;  Surgeon: Carol Ada, MD;  Location: [place];  Service: Endoscopy;  Laterality: N/A;  . HEMORRHOID BANDING    . I&D EXTREMITY Left 06/01/2017   Procedure: IRRIGATION AND DEBRIDEMENT LEFT ARM HEMATOMA WITH LIGATION OF LEFT  ARM AV FISTULA;  Surgeon: Elam Dutch, MD;  Location: [place];  Service: Vascular;  Laterality: Left;  . I&D EXTREMITY Left 06/14/2017   Procedure: IRRIGATION AND DEBRIDEMENT EXTREMITY;  Surgeon: Katha Cabal, MD;  Location: [facility];  Service: Vascular;  Laterality: Left;  . INSERTION OF DIALYSIS CATHETER  05/30/2017  . INSERTION OF DIALYSIS CATHETER N/A 05/30/2017   Procedure: INSERTION OF DIALYSIS CATHETER;  Surgeon: Elam Dutch, MD;  Location[facility];  Service: Vascular;  Laterality: N/A;  . LEFT HEART CATH AND CORONARY ANGIOGRAPHY N/A 02/22/2017   Procedure: LEFT HEART CATH AND CORONARY ANGIOGRAPHY;  Surgeon: Nigel Mormon, MD;  Location: [place] [facility];  Service: Cardiovascular;  Laterality: N/A;  . LIGATION OF ARTERIOVENOUS  FISTULA Left 3/0/8657   Procedure: Plication of Left Arm Arteriovenous Fistula;  Surgeon: Elam Dutch, MD;  Location: [place];  Service: Vascular;  Laterality: Left;  . POLYPECTOMY    . REVISON OF ARTERIOVENOUS FISTULA Left 8/46/9629   Procedure: PLICATION OF DISTAL ANEURYSMAL SEGEMENT OF LEFT UPPER ARM ARTERIOVENOUS FISTULA;  Surgeon: Elam Dutch, MD;  Location: [place];  Service: Vascular;  Laterality: Left;  . REVISON OF ARTERIOVENOUS FISTULA Left 12/07/4130   Procedure: Plication of Left Upper Arm Fistula ;  Surgeon: Waynetta Sandy, MD;  Location: [place];  Service: Vascular;  Laterality: Left;  . SKIN GRAFT SPLIT THICKNESS LEG / FOOT Left    SKIN GRAFT SPLIT THICKNESS LEFT ARM DONOR SITE: LEFT ANTERIOR THIGH  . SKIN SPLIT GRAFT Left 07/04/2017   Procedure: SKIN GRAFT SPLIT THICKNESS LEFT ARM DONOR SITE: LEFT ANTERIOR THIGH;  Surgeon: Elam Dutch, MD;  Location: [place];  Service: Vascular;  Laterality: Left;  . THROMBECTOMY W/ EMBOLECTOMY Left 06/05/2017   Procedure: EXPLORATION OF LEFT ARM FOR BLEEDING; OVERSEWED PROXIMAL FISTULA;  Surgeon: Angelia Mould, MD;  Location: [place];  Service: Vascular;  Laterality:  Left;  . WOUND EXPLORATION Left 06/03/2017   Procedure: WOUND EXPLORATION WITH WOUND VAC APPLICATION TO LEFT ARM;  Surgeon: Angelia Mould, MD;  Location: [facility] OR;  Service: Vascular;  Laterality: Left;    Social History   Socioeconomic History  . Marital status: Single    Spouse name: Not on file  . Number of children: 3  . Years of education: 10  . Highest education level: Not on file  Social Needs  . Financial resource strain: Not on file  . Food insecurity - worry: Not on file  . Food insecurity - inability: Not on file  . Transportation needs - medical: Not on file  . Transportation needs - non-medical: Not on file  Occupational History  . Occupation: Unemployed  Tobacco Use  . Smoking status: Current Every Day Smoker    Packs/day: 0.50    Years: 43.00    Pack years: 21.50    Types: Cigarettes    Start date: 08/13/1973  . Smokeless tobacco: Never Used  Substance and Sexual Activity  . Alcohol use: Yes    Frequency: Never    Comment: quit drinking in 2017  . Drug use: Yes    Types: Marijuana, Cocaine    Comment: quit in 2017"  . Sexual activity: Not on file    Comment: "NOT LATELY" on cocaine  Other Topics Concern  . Not on file  Social History Narrative   Lives alone   Caffeine use: Coffee-rare   Soda- daily      [facility] (03/10/17):   Originally from [facility]. Previously worked trimming trees. No pets currently. No bird or mold exposure.     Allergies  Allergen Reactions  . Aspirin Other (See Comments)    STOMACH PAIN  . Clonidine Derivatives Itching  . Tramadol Itching    Family History  Problem Relation Age of Onset  . Heart disease Mother   . Lung cancer Mother   . Heart disease Father   . Malignant hyperthermia Father   . COPD Father   . Throat cancer Sister   . Esophageal cancer Sister   . Hypertension Other   . COPD Other   . Colon cancer Neg Hx   . Colon polyps Neg Hx   . Rectal cancer Neg Hx   . Stomach cancer Neg Hx      Prior to Admission medications   Medication Sig Start Date End Date Taking? Authorizing Provider  acetaminophen (TYLENOL) 325 MG tablet Take 2 tablets (650 mg total) by mouth every 6 (six) hours as needed for mild pain (or Fever >/= 101). 06/23/17   Shahmehdi, Valeria Batman, MD  albuterol (PROVENTIL HFA) 108 (90 Base) MCG/ACT inhaler Inhale 2 puffs into the lungs every 6 (six) hours as needed for wheezing or shortness of breath. 08/10/17   Parrett, Fonnie Mu, NP  albuterol (PROVENTIL) (2.5 MG/3ML) 0.083% nebulizer solution Inhale 3 mLs (2.5 mg total) into the lungs every 4 (four) hours as needed for wheezing or shortness of breath. Patient not taking: Reported on 08/20/2017 07/15/17   Dagoberto Ligas, PA-C  budesonide-formoterol [facility]) 160-4.5 MCG/ACT inhaler Inhale 2 puffs into the lungs every 12 (twelve) hours.    [provider]  calcitRIOL (ROCALTROL) 0.25 MCG capsule Take 3 capsules (0.75 mcg total) by mouth every Monday, Wednesday, and Friday with hemodialysis. 03/28/17   Cherene Altes, MD  clopidogrel (PLAVIX) 75 MG tablet Take 1 tablet (75 mg total) by mouth daily. 07/15/17   Rhyne, Hulen Shouts, PA-C  diltiazem (CARDIZEM CD) 120 MG 24 hr capsule Take 1 capsule (120 mg total) by mouth daily. 06/23/17 06/23/18  ShahmehdiValeria Batman, MD  gabapentin (NEURONTIN) 100 MG capsule Take 100 mg 2 (two) times daily by mouth.    [provider]  isosorbide mononitrate (IMDUR) 30 MG 24 hr tablet Take 1 tablet (30 mg total) by mouth daily. 06/17/17   [name], MD  Melatonin 5 MG TABS Take 5 mg by mouth at bedtime as needed (sleep).    [provider]  minoxidil (LONITEN) 2.5 MG tablet Take 1 tablet (2.5 mg total) by mouth 2 (two) times daily. 06/23/17 08/29/17  Deatra James, MD  multivitamin (RENA-VIT) TABS tablet Take 1 tablet by mouth at bedtime. 06/17/17   [name], MD  nicotine (NICODERM CQ - DOSED IN MG/24 HOURS) 14 mg/24hr patch Place 14 mg daily onto the skin. 03/30/17    [provider]  oxyCODONE (ROXICODONE) 5 MG immediate release tablet Take 1 tablet (5 mg total) by mouth  every 8 (eight) hours as needed for moderate pain. 08/09/17   Rhyne, Hulen Shouts, PA-C  senna (SENOKOT) 8.6 MG TABS tablet Take 1 tablet (8.6 mg total) by mouth daily as needed for mild constipation. 06/10/17   Mariel Aloe, MD  sevelamer carbonate (RENVELA) 800 MG tablet Take 4 tablets (3,200 mg total) by mouth 3 (three) times daily with meals. Patient taking differently: Take 3,200 mg by mouth See admin instructions. 3,200 mg by mouth three times a day with meals and 1,600 mg two times a day with snacks 06/10/17   Mariel Aloe, MD  Spacer/Aero-Holding Chambers (AEROCHAMBER MV) inhaler Use as instructed 03/10/17   Javier Glazier, MD  tiotropium (SPIRIVA) 18 MCG inhalation capsule Place 1 capsule (18 mcg total) into inhaler and inhale daily. 07/16/17   Dagoberto Ligas, PA-C  vitamin C (VITAMIN C) 500 MG tablet Take 1 tablet (500 mg total) by mouth 2 (two) times daily. 06/17/17   [name], MD  zolpidem (AMBIEN) 10 MG tablet Take 1 tablet (10 mg total) by mouth at bedtime as needed for sleep. 06/10/17   Mariel Aloe, MD    Physical Exam: Vitals:   08/29/17 1208 08/29/17 1209 08/29/17 1330 08/29/17 1515  BP: (!) 135/104     Pulse:   90 94  Resp: 18  18 (!) 21  Temp: 97.9 F (36.6 C)     TempSrc: Oral     SpO2: 99%  100% 96%  Weight:  71.4 kg (157 lb 6.5 oz)    Height:  5\' 9"  (1.753 m)       General:  Appears calm and comfortable Eyes: PERRLA, sckerae unicteric, EOM intact,  ENT: normal external ear canals, oral mucous membranes moist and intact, no nasal discharge Neck: no lympnadenopathy, masses or thyromegaly Cardiovascular: RRR, no murmurs, gallops or rubs, no LE edema. Respiratory: Coarse breath sound bilaterally with occasional faint expiratory wheezes. Normal respiratory effort. Abdomen: Tender to palpation, distended, diminished BS (+) 4, no masses or  organomegaly appreciated Skin: no rash or induration seen on limited exam Musculoskeletal: no joint deformities observed, active full ROM, HD catheter in the right chest Psychiatric: grossly normal mood and affect, speech fluent and appropriate Neurologic: CN II-XII grossly normal, no focal deficit visualized, moves all extremities independently, DTR and sensation intact.      Radiological Exams on Admission: Dg Abdomen Acute W/chest  Result Date: 08/29/2017 CLINICAL DATA:  Chest pain and shortness of breath following dialysis EXAM: DG ABDOMEN ACUTE W/ 1V CHEST COMPARISON:  08/20/2017 FINDINGS: Cardiac shadow remains enlarged. Dialysis catheter is again seen and stable. Aortic calcifications are seen. The lungs are well aerated bilaterally. No focal infiltrate or effusion is seen. Stable vascular prominence is noted likely related to a degree of volume overload. Scattered large and small bowel gas is noted. No obstructive changes are seen. Endoscopic clip is noted over the lower pelvis. Diffuse vascular calcifications are seen. No free air is noted. IMPRESSION: No acute abnormality noted. Electronically Signed   By: Inez Catalina M.D.   On: 08/29/2017 13:29    EKG: Independently reviewed.  NSR with left bundle branch block, no dyspnea  Labs on Admission: I have personally reviewed the available labs and imaging studies at the time of the admission.    Assessment/Plan Principal Problem:   Chest pain Active Problems:   End stage renal disease (HCC)   Ascites   COPD (chronic obstructive pulmonary disease) (HCC)   Anemia due to chronic kidney disease  Chest pain Symptoms admitted on pain protocol Continue to cycle enzymes, monitor on  telemetry, IV morphine and nitroglycerin SL for chest pain Currently chest pain free but still has shortness of breath  Ascites [place] was performed by ED MD and showed moderate amount of fluid. Patient has significant abdominal distention and  tenderness Paracentesis was cultures and chemistries ordered We will keep patient n.p.o. after midnight for procedure  End-stage renal disease On hemodialysis Monday Wednesday Friday Had his HD session earlier today Hopefully can be discharged home later tomorrow  Anemia of chronic kidney disease Hemoglobin remains stable, continue to observe  COPD -stable Continue home inhalers and DuoNeb as needed Patient continues to smoke.  Continue to NicoDerm  DVT prophylaxis: Heparin Code Status:  Full - confirmed with patient Family Communication: None Disposition Plan: Home once clinically improved Consults called: None Admission status: observation   York Grice PA-C [phone] [facility]  If note is complete, please contact covering daytime or nighttime physician. [email or web address] Password [facility]  08/29/2017, 6:05 PM   Patient seen with PA-C. Has chest pain but also Ascites which may be causing pressure on his heart. He is a dialysis patient. Chronic pain issues with appointment tomorrow with his pain clinic in [facility]. I agree with above assessment and plan. We will remove patient out and hopefully discharge tomorrow. He's already had hemodialysis today.

## 2017-08-29 NOTE — ED Notes (Signed)
Pt took himself off of cardiac monitor as well as BP and pulse o2. Pt is refusing to be on the monitor at this time due to it being very cumbersome for him. Pt informed this is the way we monitor him and document vital signs. Will attempt to collect vital signs Q 2 hours. Pt ambulated to RR with steady gait.

## 2017-08-29 NOTE — ED Notes (Signed)
Pt informed NPO.

## 2017-08-29 NOTE — ED Notes (Signed)
ORDERED DIET TRAY FOR PT  

## 2017-08-29 NOTE — ED Provider Notes (Signed)
Emergency Department Provider Note   I have reviewed the triage vital signs and the nursing notes.   HISTORY  Chief Complaint Chest Pain   HPI [name] is a 57 y.o. male with PMH of ESRD, COPD, cirrhosis with Hep C, and HTN's to the emergency department for evaluation of chest pain and abdominal distention.  Chest pain began after dialysis today.  Describes it as sharp and intermittent.  No radiation of symptoms.  No modifying factors.  It is since resolved.  He states he is not very concerned about this but he primarily came because his abdomen is become more distended.  Denies pain to the abdomen, fever, chills.  He has never had paracentesis.    Past Medical History:  Diagnosis Date  . Adenomatous colon polyp    tubular  . Anemia   . Anxiety   . Arthritis    left shoulder  . Atherosclerosis of aorta ([place])   . Cardiomegaly   . Chest pain    DATE UNKNOWN, C/O PERIODICALLY  . Cocaine abuse ([place])   . COPD exacerbation ([place]) 08/17/2016  . Coronary artery disease    stent 02/22/17  . Dialysis patient ([place])    Monday-Wednesday-Friday  . ESRD (end stage renal disease) on dialysis ([place])    "[name]; MWF" (07/04/2017)  . GERD (gastroesophageal reflux disease)    DATE UNKNOWN  . Hemorrhoids   . Hepatitis B, chronic ([place])   . Hepatitis C   . History of kidney stones   . Hyperkalemia   . Hypertension   . Metabolic bone disease    Patient denies  . Myocardial infarction ([place])   . Pneumonia   . Pulmonary edema   . Renal disorder   . Renal insufficiency   . Shortness of breath dyspnea    " for the last past year with this dialysis"  . Tubular adenoma of colon     Patient Active Problem List   Diagnosis Date Noted  . Hematochezia 07/15/2017  . Wide-complex tachycardia ([place])   . Endotracheally intubated   . ESRD (end stage renal disease) ([place]) 07/04/2017  . Acute respiratory failure with hypoxia ([place]) 06/18/2017  . CKD (chronic kidney disease) stage  V requiring chronic dialysis ([place]) 06/18/2017  . History of Cocaine abuse ([place]) 06/18/2017  . Hypertension 06/18/2017  . Infection of AV graft for dialysis ([place]) 06/18/2017  . Anxiety 06/18/2017  . Anemia due to chronic kidney disease 06/18/2017  . Atrial flutter with rapid ventricular response ([place]) 06/18/2017  . Personality disorder ([place]) 06/13/2017  . Cellulitis 06/12/2017  . Adjustment disorder with mixed anxiety and depressed mood 06/10/2017  . Suicidal ideation 06/10/2017  . Arm wound, left, sequela 06/10/2017  . Dyspnea 05/29/2017  . Tachycardia 05/29/2017  . Hyperkalemia 05/22/2017  . Acute metabolic encephalopathy   . Anemia 04/23/2017  . Ascites 04/23/2017  . COPD (chronic obstructive pulmonary disease) ([place]) 04/23/2017  . Acute on chronic respiratory failure with hypoxia ([place]) 03/25/2017  . Arrhythmia 03/25/2017  . COPD GOLD II/ still smoking 09/27/2016  . Essential hypertension 09/27/2016  . Fluid overload 08/30/2016  . COPD exacerbation ([place]) 08/17/2016  . Hypertensive urgency 08/17/2016  . Respiratory failure ([place]) 08/17/2016  . Problem with dialysis access ([place]) 07/23/2016  . End stage renal disease ([place]) 12/02/2015  . Chronic hepatitis B ([place]) 03/05/2014  . Chronic hepatitis C without hepatic coma ([place]) 03/05/2014  . Internal hemorrhoids with bleeding, swelling and itching 03/05/2014  . Thrombocytopenia ([place]) 03/05/2014  .  Chest pain 02/27/2014  . Alcohol abuse 04/14/2009  . Tobacco use disorder 04/14/2009  . GANGLION CYST 04/14/2009    Past Surgical History:  Procedure Laterality Date  . A/V FISTULAGRAM Left 05/26/2017   Procedure: A/V FISTULAGRAM;  Surgeon: Conrad Wallace, MD;  Location: [facility];  Service: Cardiovascular;  Laterality: Left;  . APPLICATION OF WOUND VAC Left 06/14/2017   Procedure: APPLICATION OF WOUND VAC;  Surgeon: Katha Cabal, MD;  Location: [facility];  Service: Vascular;  Laterality: Left;  . AV FISTULA PLACEMENT  2012     BELIEVED WAS PLACED IN JUNE  . AV FISTULA PLACEMENT Right 08/09/2017   Procedure: Creation Right arm ARTERIOVENOUS BRACHIOCEPOHALIC FISTULA;  Surgeon: Elam Dutch, MD;  Location: [facility];  Service: Vascular;  Laterality: Right;  . COLONOSCOPY    . CORONARY STENT INTERVENTION N/A 02/22/2017   Procedure: CORONARY STENT INTERVENTION;  Surgeon: Nigel Mormon, MD;  Location: [place] [facility];  Service: Cardiovascular;  Laterality: N/A;  . FLEXIBLE SIGMOIDOSCOPY N/A 07/15/2017   Procedure: FLEXIBLE SIGMOIDOSCOPY;  Surgeon: Carol Ada, MD;  Location: [place];  Service: Endoscopy;  Laterality: N/A;  . HEMORRHOID BANDING    . I&D EXTREMITY Left 06/01/2017   Procedure: IRRIGATION AND DEBRIDEMENT LEFT ARM HEMATOMA WITH LIGATION OF LEFT ARM AV FISTULA;  Surgeon: Elam Dutch, MD;  Location: [place];  Service: Vascular;  Laterality: Left;  . I&D EXTREMITY Left 06/14/2017   Procedure: IRRIGATION AND DEBRIDEMENT EXTREMITY;  Surgeon: Katha Cabal, MD;  Location: [facility];  Service: Vascular;  Laterality: Left;  . INSERTION OF DIALYSIS CATHETER  05/30/2017  . INSERTION OF DIALYSIS CATHETER N/A 05/30/2017   Procedure: INSERTION OF DIALYSIS CATHETER;  Surgeon: Elam Dutch, MD;  Location: [facility];  Service: Vascular;  Laterality: N/A;  . LEFT HEART CATH AND CORONARY ANGIOGRAPHY N/A 02/22/2017   Procedure: LEFT HEART CATH AND CORONARY ANGIOGRAPHY;  Surgeon: Nigel Mormon, MD;  Location: [facility];  Service: Cardiovascular;  Laterality: N/A;  . LIGATION OF ARTERIOVENOUS  FISTULA Left 02/12/6658   Procedure: Plication of Left Arm Arteriovenous Fistula;  Surgeon: Elam Dutch, MD;  Location: [place];  Service: Vascular;  Laterality: Left;  . POLYPECTOMY    . REVISON OF ARTERIOVENOUS FISTULA Left 9/35/7017   Procedure: PLICATION OF DISTAL ANEURYSMAL SEGEMENT OF LEFT UPPER ARM ARTERIOVENOUS FISTULA;  Surgeon: Elam Dutch, MD;  Location: [place];  Service: Vascular;   Laterality: Left;  . REVISON OF ARTERIOVENOUS FISTULA Left 7/93/9030   Procedure: Plication of Left Upper Arm Fistula ;  Surgeon: Waynetta Sandy, MD;  Location: [place];  Service: Vascular;  Laterality: Left;  . SKIN GRAFT SPLIT THICKNESS LEG / FOOT Left    SKIN GRAFT SPLIT THICKNESS LEFT ARM DONOR SITE: LEFT ANTERIOR THIGH  . SKIN SPLIT GRAFT Left 07/04/2017   Procedure: SKIN GRAFT SPLIT THICKNESS LEFT ARM DONOR SITE: LEFT ANTERIOR THIGH;  Surgeon: Elam Dutch, MD;  Location: [place];  Service: Vascular;  Laterality: Left;  . THROMBECTOMY W/ EMBOLECTOMY Left 06/05/2017   Procedure: EXPLORATION OF LEFT ARM FOR BLEEDING; OVERSEWED PROXIMAL FISTULA;  Surgeon: Angelia Mould, MD;  Location: [place] [facility];  Service: Vascular;  Laterality: Left;  . WOUND EXPLORATION Left 06/03/2017   Procedure: WOUND EXPLORATION WITH WOUND VAC APPLICATION TO LEFT ARM;  Surgeon: Angelia Mould, MD;  Location: [place];  Service: Vascular;  Laterality: Left;    Current Outpatient Rx  . Order #:  [id number] Class: Normal  . Order #: [id number] Class: Historical Med  . Order #: [id number] Class: Historical Med  . Order #: [id number] Class: Print  . Order #: [id number] Class: Normal  . Order #: [id number] Class: Historical Med  . Order #: [id number] Class: Historical Med  . Order #: [id number] Class: Historical Med  . Order #: [id number] Class: Print  . Order #: [id number] Class: Print  . Order #: [id number] Class: No Print  . Order #: [id number] Class: Print  . Order #: [id number] Class: Historical Med  . Order #: [id number] Class: Normal  . Order #: [id number] Class: Print  . Order #: [id number] Class: No Print  . Order #: [id number] Class: No Print  . Order #: [id number] Class: Print  . Order #: [id number] Class: Print  . Order #: [id number] Class: No Print    Allergies Aspirin; Clonidine derivatives; and Tramadol  Family History  Problem Relation Age of Onset  . Heart disease Mother   . Lung cancer Mother   . Heart  disease Father   . Malignant hyperthermia Father   . COPD Father   . Throat cancer Sister   . Esophageal cancer Sister   . Hypertension Other   . COPD Other   . Colon cancer Neg Hx   . Colon polyps Neg Hx   . Rectal cancer Neg Hx   . Stomach cancer Neg Hx     Social History Social History   Tobacco Use  . Smoking status: Current Every Day Smoker    Packs/day: 0.50    Years: 43.00    Pack years: 21.50    Types: Cigarettes    Start date: 08/13/1973  . Smokeless tobacco: Never Used  Substance Use Topics  . Alcohol use: Yes    Frequency: Never    Comment: quit drinking in 2017  . Drug use: Yes    Types: Marijuana, Cocaine    Comment: quit in 2017"    Review of Systems  Constitutional: No fever/chills Eyes: No visual changes. ENT: No sore throat. Cardiovascular: Positive chest pain. Respiratory: Positive shortness of breath. Gastrointestinal: No abdominal pain.  No nausea, no vomiting.  No diarrhea.  No constipation. Positive abdominal distension.  Genitourinary: Negative for dysuria. Musculoskeletal: Negative for back pain. Skin: Negative for rash. Neurological: Negative for headaches, focal weakness or numbness.  10-point ROS otherwise negative.  ____________________________________________   PHYSICAL EXAM:  VITAL SIGNS: ED Triage Vitals  Enc Vitals Group     BP 08/29/17 1200 103/62     Pulse Rate 08/29/17 1200 94     Resp 08/29/17 1200 19     Temp 08/29/17 1208 97.9 F (36.6 C)     Temp Source 08/29/17 1208 Oral     SpO2 08/29/17 1200 99 %     Weight 08/29/17 1209 157 lb 6.5 oz (71.4 kg)     Height 08/29/17 1209 5\' 9"  (1.753 m)     Pain Score 08/29/17 1159 0   Constitutional: Alert and oriented. Well appearing and in no acute distress. Eyes: Conjunctivae are normal.  Head: Atraumatic. Nose: No congestion/rhinnorhea. Mouth/Throat: Mucous membranes are moist.  Neck: No stridor.   Cardiovascular: Normal rate, regular rhythm. Good peripheral  circulation. Grossly normal heart sounds.   Respiratory: Normal respiratory effort.  No retractions. Lungs CTAB. Gastrointestinal: Soft and nontender. Positive moderate distention.  Musculoskeletal: No lower extremity tenderness nor edema. No gross deformities of extremities. Neurologic:  Normal speech and language. No gross focal neurologic deficits are appreciated.  Skin:  Skin is warm, dry and intact. No rash noted.  ____________________________________________   LABS (all  labs ordered are listed, but only abnormal results are displayed)  Labs Reviewed  COMPREHENSIVE METABOLIC PANEL - Abnormal; Notable for the following components:      Result Value   Chloride 94 (*)    Glucose, Bld 115 (*)    BUN 27 (*)    Creatinine, Ser 3.97 (*)    Calcium 8.1 (*)    Alkaline Phosphatase 134 (*)    GFR calc non Af Amer 16 (*)    GFR calc Af Amer 18 (*)    All other components within normal limits  CBC WITH DIFFERENTIAL/PLATELET - Abnormal; Notable for the following components:   RBC 3.23 (*)    Hemoglobin 9.2 (*)    HCT 26.7 (*)    RDW 18.5 (*)    Platelets 121 (*)    All other components within normal limits  BRAIN NATRIURETIC PEPTIDE - Abnormal; Notable for the following components:   B Natriuretic Peptide 943.6 (*)    All other components within normal limits  PROTIME-INR  BASIC METABOLIC PANEL  CBC  TROPONIN I  TROPONIN I  TROPONIN I  I-STAT TROPONIN, ED  I-STAT TROPONIN, ED   ____________________________________________  EKG   EKG Interpretation  Date/Time:  Monday August 29 2017 12:01:29 EST Ventricular Rate:  122 PR Interval:    QRS Duration: 163 QT Interval:  429 QTC Calculation: 612 R Axis:   -72 Text Interpretation:  Sinus tachycardia Prominent P waves, nondiagnostic Left bundle branch block Similar to 08/20/2017 tracing. No STEMI.  Confirmed by Nanda Quinton [phone]) on 08/29/2017 12:05:01 PM        ____________________________________________  RADIOLOGY  Dg Abdomen Acute W/chest  Result Date: 08/29/2017 CLINICAL DATA:  Chest pain and shortness of breath following dialysis EXAM: DG ABDOMEN ACUTE W/ 1V CHEST COMPARISON:  08/20/2017 FINDINGS: Cardiac shadow remains enlarged. Dialysis catheter is again seen and stable. Aortic calcifications are seen. The lungs are well aerated bilaterally. No focal infiltrate or effusion is seen. Stable vascular prominence is noted likely related to a degree of volume overload. Scattered large and small bowel gas is noted. No obstructive changes are seen. Endoscopic clip is noted over the lower pelvis. Diffuse vascular calcifications are seen. No free air is noted. IMPRESSION: No acute abnormality noted. Electronically Signed   By: Inez Catalina M.D.   On: 08/29/2017 13:29    ____________________________________________   PROCEDURES  Procedure(s) performed:   Procedures  None ________________________________________   INITIAL IMPRESSION / ASSESSMENT AND PLAN / ED COURSE  Pertinent labs & imaging results that were available during my care of the patient were reviewed by me and considered in my medical decision making (see chart for details).  Patient presents to the emergency department for evaluation of chest pain with some shortness of breath which occurred shortly after dialysis.  He states that his abdomen is also increasing in size.  He states that he is not had enough fluid to draw off in the past.  No fevers.  Patient with soft, nontender abdomen that is distended.  There is no tense ascites.  I performed a bedside ultrasound which shows ascites but the patient's liver and small intestines are very close to the abdominal wall even with repositioning.  I have no clinical concern for spontaneous bacterial peritonitis.    Troponin and imaging reviewed with no acute findings. Will discuss admission for troponin trending and IR consultation for  LVP.   Discussed patient's case with Hospitalist to request admission. Patient and family (if present) updated  with plan. Care transferred to Hospitalist service.  I reviewed all nursing notes, vitals, pertinent old records, EKGs, labs, imaging (as available).  ____________________________________________  FINAL CLINICAL IMPRESSION(S) / ED DIAGNOSES  Final diagnoses:  Other ascites  Dyspnea, unspecified type  Precordial chest pain    Note:  This document was prepared using Dragon voice recognition software and may include unintentional dictation errors.  Nanda Quinton, MD Emergency Medicine    Long, [facility], MD 08/29/17 [phone]

## 2017-08-29 NOTE — ED Notes (Signed)
Pt given graham crackers, per Lovena Le - RN.

## 2017-08-29 NOTE — ED Notes (Signed)
Pt refuses to be on the monitor. Lovena Le - RN aware.

## 2017-08-29 NOTE — ED Notes (Signed)
[facility] paged for troponin of 0.07

## 2017-08-29 NOTE — ED Triage Notes (Signed)
Pt finished his dialysis PTA to ED after his treatment he began having cp with sob, pt states this is due to increase swelling in his abd. Pt also has generalized abd pain has been using laxatives. Pt was given 324 mg ASA PTA.

## 2017-08-29 NOTE — ED Notes (Signed)
ED Provider at bedside. 

## 2017-08-30 ENCOUNTER — Other Ambulatory Visit: Payer: Self-pay

## 2017-08-30 ENCOUNTER — Observation Stay (HOSPITAL_COMMUNITY): Payer: Medicare Other

## 2017-08-30 ENCOUNTER — Other Ambulatory Visit (HOSPITAL_COMMUNITY): Payer: Self-pay

## 2017-08-30 ENCOUNTER — Encounter (HOSPITAL_COMMUNITY): Payer: Self-pay | Admitting: *Deleted

## 2017-08-30 ENCOUNTER — Observation Stay (HOSPITAL_BASED_OUTPATIENT_CLINIC_OR_DEPARTMENT_OTHER): Payer: Medicare Other

## 2017-08-30 DIAGNOSIS — R188 Other ascites: Secondary | ICD-10-CM

## 2017-08-30 DIAGNOSIS — R079 Chest pain, unspecified: Secondary | ICD-10-CM

## 2017-08-30 DIAGNOSIS — Z992 Dependence on renal dialysis: Secondary | ICD-10-CM | POA: Diagnosis not present

## 2017-08-30 DIAGNOSIS — N186 End stage renal disease: Secondary | ICD-10-CM

## 2017-08-30 DIAGNOSIS — J42 Unspecified chronic bronchitis: Secondary | ICD-10-CM

## 2017-08-30 DIAGNOSIS — R072 Precordial pain: Secondary | ICD-10-CM | POA: Diagnosis not present

## 2017-08-30 DIAGNOSIS — R0600 Dyspnea, unspecified: Secondary | ICD-10-CM | POA: Diagnosis not present

## 2017-08-30 DIAGNOSIS — D631 Anemia in chronic kidney disease: Secondary | ICD-10-CM | POA: Diagnosis not present

## 2017-08-30 DIAGNOSIS — I361 Nonrheumatic tricuspid (valve) insufficiency: Secondary | ICD-10-CM

## 2017-08-30 HISTORY — PX: IR PARACENTESIS: IMG2679

## 2017-08-30 LAB — ECHOCARDIOGRAM COMPLETE
Height: 69 in
Weight: 2499.2 oz

## 2017-08-30 LAB — CBC
HCT: 28.2 % — ABNORMAL LOW (ref 39.0–52.0)
Hemoglobin: 9.4 g/dL — ABNORMAL LOW (ref 13.0–17.0)
MCH: 27.9 pg (ref 26.0–34.0)
MCHC: 33.3 g/dL (ref 30.0–36.0)
MCV: 83.7 fL (ref 78.0–100.0)
Platelets: 133 10*3/uL — ABNORMAL LOW (ref 150–400)
RBC: 3.37 MIL/uL — ABNORMAL LOW (ref 4.22–5.81)
RDW: 18.1 % — ABNORMAL HIGH (ref 11.5–15.5)
WBC: 8.9 10*3/uL (ref 4.0–10.5)

## 2017-08-30 LAB — BASIC METABOLIC PANEL
Anion gap: 15 (ref 5–15)
BUN: 38 mg/dL — ABNORMAL HIGH (ref 6–20)
CO2: 27 mmol/L (ref 22–32)
Calcium: 9.1 mg/dL (ref 8.9–10.3)
Chloride: 92 mmol/L — ABNORMAL LOW (ref 101–111)
Creatinine, Ser: 4.97 mg/dL — ABNORMAL HIGH (ref 0.61–1.24)
GFR calc Af Amer: 14 mL/min — ABNORMAL LOW (ref >60)
GFR calc non Af Amer: 12 mL/min — ABNORMAL LOW (ref >60)
Glucose, Bld: 106 mg/dL — ABNORMAL HIGH (ref 65–99)
Potassium: 4.6 mmol/L (ref 3.5–5.1)
Sodium: 134 mmol/L — ABNORMAL LOW (ref 135–145)

## 2017-08-30 LAB — GLUCOSE, PLEURAL OR PERITONEAL FLUID: Glucose, Fluid: 110 mg/dL

## 2017-08-30 LAB — GRAM STAIN

## 2017-08-30 LAB — ALBUMIN, PLEURAL OR PERITONEAL FLUID: Albumin, Fluid: 2.1 g/dL

## 2017-08-30 LAB — BODY FLUID CELL COUNT WITH DIFFERENTIAL
Eos, Fluid: 0 %
Lymphs, Fluid: 20 %
Monocyte-Macrophage-Serous Fluid: 79 % (ref 50–90)
Neutrophil Count, Fluid: 1 % (ref 0–25)
Total Nucleated Cell Count, Fluid: 91 cu mm (ref 0–1000)

## 2017-08-30 LAB — TROPONIN I: Troponin I: 0.08 ng/mL (ref <0.03)

## 2017-08-30 LAB — PROTEIN, PLEURAL OR PERITONEAL FLUID: Total protein, fluid: 3.5 g/dL

## 2017-08-30 MED ORDER — ALTEPLASE 2 MG IJ SOLR: 2.0000 mg | Freq: Once | INTRAMUSCULAR | Status: DC | PRN

## 2017-08-30 MED ORDER — SODIUM CHLORIDE 0.9 % IV SOLN: 100.0000 mL | INTRAVENOUS | Status: DC | PRN

## 2017-08-30 MED ORDER — LIDOCAINE 2% (20 MG/ML) 5 ML SYRINGE
INTRAMUSCULAR | Status: AC
  Filled 2017-08-30: qty 10

## 2017-08-30 MED ORDER — HEPARIN SODIUM (PORCINE) 1000 UNIT/ML DIALYSIS
1000.0000 [IU] | INTRAMUSCULAR | Status: DC | PRN
  Filled 2017-08-30: qty 1

## 2017-08-30 MED ORDER — HEPARIN SODIUM (PORCINE) 1000 UNIT/ML DIALYSIS
3000.0000 [IU] | Freq: Once | INTRAMUSCULAR | Status: DC
  Filled 2017-08-30: qty 3

## 2017-08-30 MED ORDER — PENTAFLUOROPROP-TETRAFLUOROETH EX AERO: 1.0000 "application " | INHALATION_SPRAY | CUTANEOUS | Status: DC | PRN

## 2017-08-30 MED ORDER — LIDOCAINE-PRILOCAINE 2.5-2.5 % EX CREA: 1.0000 "application " | TOPICAL_CREAM | CUTANEOUS | Status: DC | PRN

## 2017-08-30 MED ORDER — LIDOCAINE-PRILOCAINE 2.5-2.5 % EX CREA
1.0000 "application " | TOPICAL_CREAM | CUTANEOUS | Status: DC | PRN
  Filled 2017-08-30: qty 5

## 2017-08-30 MED ORDER — LIDOCAINE HCL (PF) 1 % IJ SOLN
INTRAMUSCULAR | Status: AC | PRN
  Administered 2017-08-30: 8 mL

## 2017-08-30 MED ORDER — LIDOCAINE HCL (PF) 1 % IJ SOLN: 5.0000 mL | INTRAMUSCULAR | Status: DC | PRN

## 2017-08-30 MED ORDER — HEPARIN SODIUM (PORCINE) 1000 UNIT/ML DIALYSIS
20.0000 [IU]/kg | INTRAMUSCULAR | Status: DC | PRN
  Filled 2017-08-30: qty 2

## 2017-08-30 NOTE — Progress Notes (Addendum)
Pt has signed paperwork indicating that he is leaving AMA.  Pt is unwilling to participate with Plan of Care (refusing medications, refusing cardiac monitoring, refusing blood lab draws). MD has been notified. PiV has been removed without complication. Cardiac telemetry had already been removed via pt.  CCMD has been aware.

## 2017-08-30 NOTE — Progress Notes (Signed)
Notified by phlebotomy that pt is refusing another set of lab blood draws.  MD notified.

## 2017-08-30 NOTE — Plan of Care (Signed)
  Health Behavior/Discharge Planning: Ability to manage health-related needs will improve 08/30/2017 1029 - Not Progressing by Imagene Gurney, RN

## 2017-08-30 NOTE — Procedures (Signed)
PROCEDURE SUMMARY:  Successful US guided paracentesis from right lateral abdomen.  Yielded 600 mL of amber fluid.  No immediate complications.  Pt tolerated well.   Specimen was sent for labs.  Docia Barrier PA-C 08/30/2017 2:53 PM

## 2017-08-30 NOTE — Progress Notes (Signed)
  Echocardiogram 2D Echocardiogram has been performed.  Jennette Dubin 08/30/2017, 9:31 AM

## 2017-08-30 NOTE — ED Notes (Signed)
Admitting paged to notify of troponin 0.08.

## 2017-08-30 NOTE — Progress Notes (Addendum)
New pt admission from 5 c. Pt brought to the floor in stable condition. Vitals taken. Initial Assessment done. All immediate pertinent needs to patient addressed. Patient Guide given to patient. Important safety instructions relating to hospitalization reviewed with patient. Patient verbalized understanding. Patient is refusing tele monitor.Will continue to monitor pt.  Palma Holter, RN

## 2017-08-30 NOTE — Progress Notes (Signed)
[name] is a 57 Y/O AA male with ESRD on hemodialysis MWF at [facility]. PMH: HTN, Hepatitis C, Hepatitis B, Afib (not on coumadin), COPD, polysubstance abuse, infected AVF post [facility] placement and skin grafting to old AVF site. Last HD 08/29/17 ran full treatment, left 0.9 above OP EDW 70.5 kg.   He presented to ED post HD 08/29/17 with C/O chest pain/SOB. He has been admitted as observation patient for chest pain. We will order and supervise hemodialysis for tomorrow. Please notify us if patient status upgraded to in-patient and we will consult formally.   HD orders: [facility] MWF 4 hrs 180 NRe 400/800 manual 2.0 K/2.0 Ca  RIJ TDC/R maturing AVF -Mircera 200 mcg IV q 2 weeks (last dose 08/17/17 Last HGB 9.0 08/24/17) -Venofer 100 mg IV X 10 (8/10 doses given last dose 08/29/17) -Sensipar 120 mg PO TIW -Calcitriol 0.5 mcg PO TIW  Joshua Fairly NP-C [place] [phone] (pager)  Pt seen, examined and agree w A/P as above.  [name] MD [employer] pager [phone]   08/31/2017, 9:28 AM

## 2017-08-30 NOTE — Progress Notes (Signed)
RN paged regarding patient's refusal to tele-monitor  Palma Holter, RN

## 2017-08-30 NOTE — Discharge Summary (Signed)
Physician Discharge Summary  [name] RJJ:[id number] DOB: [birth year] DOA: 08/29/2017  PCP: [name], MD  Admit date: 08/29/2017 Discharge date: 08/30/2017  Admitted From: Home Disposition: Left Against Medical Advice   Recommendations for Outpatient Follow-up:  1. Follow up with PCP in 1-2 weeks 2. Follow up with Cardiology at D/C 3. Follow up with Nephrology for continuation of Dialysis  4. Please obtain CMP/CBC, Mag, Phos in one week 5. Please follow up on the following pending results: Paracentesis Fluid studies  Home Health: No Equipment/Devices: None   Discharge Condition: Guarded; Patient Left AMA  CODE STATUS: FULL CODE  Diet recommendation: Heart Healthy Diet    Brief/Interim Summary: [name] is a 57 y.o. male with medical history significant of ESRD on Monday, Wednesday and Friday dialysis status post left AV fistula complicated by fistula infection status post skin graft, status post PermCath placement and right chest status post right AV fistula pending maturity, chronic atrial flutter/fibrillation not on anticoagulation, hepatitis B and hepatitis C, COPD who came in with complaints of chest pain and shortness of breath.  Patient was at hemodialysis yesterday when he started experiencing chest symptoms at the end of dialysis session and it was reduced by 40 minutes.  He called EMS and was brought to [facility].  He also complained of abdominal fullness and distention that has been going on for a while, but denied nausea vomiting or diarrhea. Admitted for CP and Abdominal Ascites. Cardiology Dr. Einar Gip was consulted for evaluation of his CP but was unable to see the patient as the patient signed out Against Medical Advice. This AM he started refusing laboratory work, medications, and care and stated that after he got his Paracentesis he would sign out. I talked with him extensively and counseled him stating that I was not medically  clearing him as he was still being worked up but being of sound mind he understood the risks of signing out including further decompensation, risk of worsening, and potential death. He understood those risks and after he was brought back from his Paracentesis proceeded to sign out of the hospital Against Medical Advice.   Discharge Diagnoses:  Principal Problem:   Chest pain Active Problems:   End stage renal disease (HCC)   Ascites   COPD (chronic obstructive pulmonary disease) (HCC)   Anemia due to chronic kidney disease  Chest pain -Symptoms admitted on pain protocol -Continue to cycle enzymes, monitor on  telemetry, IV morphine and nitroglycerin SL for chest pain but patient became belligerent and started refusing care -Currently chest pain free but still has shortness of breath -ECHOCardiogram ordered and showed EF of 55-60%, Severe pulmonary HTN, a Moderate Pericardial Effusion and Severe Mitral Stenosis -Cardiology Dr. Einar Gip consulted but prior to being seen patient signed out Against Medical Advice  Ascites -[place] was performed by ED MD and showed moderate amount of fluid. -Patient has significant abdominal distention and tenderness -Paracentesis was cultures and chemistries ordered -Patient had Paracentesis and signed out AMA after Paracentesis   End-stage renal disease -On hemodialysis Monday Wednesday Friday -Had his HD session earlier yesterday  -Signed out AMA   Anemia of chronic kidney disease -Hemoglobin remains stable, continue to observe -Signed out AMA  COPD -stable -Continue home inhalers and DuoNeb as needed -Patient continues to smoke.  Continue to NicoDerm -Signed out [facility]   Discharge Instructions  Discharge Instructions    Call MD for:  difficulty breathing, headache or visual disturbances   Complete by:  As  directed    Call MD for:  extreme fatigue   Complete by:  As directed    Call MD for:  hives   Complete by:  As directed    Call MD for:   persistant dizziness or light-headedness   Complete by:  As directed    Call MD for:  persistant nausea and vomiting   Complete by:  As directed    Call MD for:  redness, tenderness, or signs of infection (pain, swelling, redness, odor or green/yellow discharge around incision site)   Complete by:  As directed    Call MD for:  severe uncontrolled pain   Complete by:  As directed    Call MD for:  temperature >100.4   Complete by:  As directed    Diet - low sodium heart healthy   Complete by:  As directed    Discharge instructions   Complete by:  As directed    Follow up with PCP, Nephrology and Cardiology at D/C. Patient understands the risks of leaving AMA including decompensation, worsening, and death. Patient being of sound is refusing care and signing out Against Medical Advice. If symptoms worsen or change please return for re-evaluation.   Increase activity slowly   Complete by:  As directed      Allergies as of 08/30/2017      Reactions   Aspirin Other (See Comments)   STOMACH PAIN   Clonidine Derivatives Itching   Tramadol Itching      Medication List    TAKE these medications   acetaminophen 325 MG tablet Commonly known as:  TYLENOL Take 2 tablets (650 mg total) by mouth every 6 (six) hours as needed for mild pain (or Fever >/= 101).   AEROCHAMBER MV inhaler Use as instructed   ascorbic acid 500 MG tablet Commonly known as:  VITAMIN C Take 1 tablet (500 mg total) by mouth 2 (two) times daily.   budesonide-formoterol 160-4.5 MCG/ACT inhaler Commonly known as:  SYMBICORT Inhale 2 puffs into the lungs every 12 (twelve) hours.   calcitRIOL 0.25 MCG capsule Commonly known as:  ROCALTROL Take 3 capsules (0.75 mcg total) by mouth every Monday, Wednesday, and Friday with hemodialysis.   clopidogrel 75 MG tablet Commonly known as:  PLAVIX Take 1 tablet (75 mg total) by mouth daily.   diltiazem 120 MG 24 hr capsule Commonly known as:  CARDIZEM CD Take 1 capsule  (120 mg total) by mouth daily.   gabapentin 100 MG capsule Commonly known as:  NEURONTIN Take 100 mg 2 (two) times daily by mouth.   isosorbide mononitrate 30 MG 24 hr tablet Commonly known as:  IMDUR Take 1 tablet (30 mg total) by mouth daily.   Melatonin 5 MG Tabs Take 5 mg by mouth at bedtime as needed (sleep).   minoxidil 10 MG tablet Commonly known as:  LONITEN Take 10 mg by mouth daily.   multivitamin Tabs tablet Take 1 tablet by mouth at bedtime.   nicotine 21 mg/24hr patch Commonly known as:  NICODERM CQ - dosed in mg/24 hours Place 21 mg onto the skin daily.   oxyCODONE 5 MG immediate release tablet Commonly known as:  ROXICODONE Take 1 tablet (5 mg total) by mouth every 8 (eight) hours as needed for moderate pain.   senna 8.6 MG Tabs tablet Commonly known as:  SENOKOT Take 1 tablet (8.6 mg total) by mouth daily as needed for mild constipation.   sevelamer carbonate 800 MG tablet Commonly known as:  RENVELA Take 4 tablets (3,200 mg total) by mouth 3 (three) times daily with meals. What changed:    when to take this  additional instructions   tiotropium 18 MCG inhalation capsule Commonly known as:  SPIRIVA Place 1 capsule (18 mcg total) into inhaler and inhale daily.   VENTOLIN HFA 108 (90 Base) MCG/ACT inhaler Generic drug:  albuterol Inhale 2 puffs into the lungs every 6 (six) hours as needed for wheezing or shortness of breath.   zolpidem 10 MG tablet Commonly known as:  AMBIEN Take 1 tablet (10 mg total) by mouth at bedtime as needed for sleep.      Follow-up Information    [name], MD. Call.   Specialty:  Internal Medicine Why:  Follow up with PCP at D/C Contact information: [address] [place] [address] [phone]        Adrian Prows, MD. Call.   Specialty:  Cardiology Why:  Follow up with Cardiology at D/C Contact information: [address] [place] [address] [phone]           Allergies  Allergen Reactions  . Aspirin Other (See Comments)    STOMACH PAIN  . Clonidine Derivatives Itching  . Tramadol Itching   Consultations:  Cardiology (never saw patient as patient signed out Against Medical Advice)  Procedures/Studies: Dg Chest Port 1 View  Result Date: 08/20/2017 CLINICAL DATA:  Chest pain.  Renal failure EXAM: PORTABLE CHEST 1 VIEW COMPARISON:  July 14, 2017 FINDINGS: Central catheter tip is in the right atrium. No pneumothorax. There is a small left pleural effusion with mild left base atelectasis. Lungs elsewhere are clear. Heart is enlarged with mild pulmonary venous hypertension. There is aortic atherosclerosis. No adenopathy. No bone lesions. There is calcification in both carotid arteries. There is also calcification in the left and right subclavian arteries. IMPRESSION: Central catheter tip in right atrium. No pneumothorax. Small left pleural effusion with left base atelectasis. No frank edema or consolidation. There is underlying pulmonary vascular congestion. There is aortic atherosclerosis as well as extensive carotid and subclavian artery calcification. Aortic Atherosclerosis (ICD10-I70.0). Electronically Signed   By: Lowella Grip III M.D.   On: 08/20/2017 15:11   Dg Abdomen Acute W/chest  Result Date: 08/29/2017 CLINICAL DATA:  Chest pain and shortness of breath following dialysis EXAM: DG ABDOMEN ACUTE W/ 1V CHEST COMPARISON:  08/20/2017 FINDINGS: Cardiac shadow remains enlarged. Dialysis catheter is again seen and stable. Aortic calcifications are seen. The lungs are well aerated bilaterally. No focal infiltrate or effusion is seen. Stable vascular prominence is noted likely related to a degree of volume overload. Scattered large and small bowel gas is noted. No obstructive changes are seen. Endoscopic clip is noted over the lower pelvis. Diffuse vascular calcifications are seen. No free air is noted. IMPRESSION: No acute abnormality noted.  Electronically Signed   By: Inez Catalina M.D.   On: 08/29/2017 13:29   Ir Paracentesis  Result Date: 08/30/2017 INDICATION: Patient with history of end-stage renal disease, now with abdominal distension. Request is made for diagnostic and therapeutic paracentesis. EXAM: ULTRASOUND GUIDED DIAGNOSTIC AND THERAPEUTIC PARACENTESIS MEDICATIONS: 10 mL 2% lidocaine COMPLICATIONS: None immediate. PROCEDURE: Informed written consent was obtained from the patient after a discussion of the risks, benefits and alternatives to treatment. A timeout was performed prior to the initiation of the procedure. Initial ultrasound scanning demonstrates a small amount of ascites within the right lower abdominal quadrant. The right lower abdomen was prepped  and draped in the usual sterile fashion. 2% lidocaine was used for local anesthesia. Following this, a 19 gauge, 7-cm, Yueh catheter was introduced. An ultrasound image was saved for documentation purposes. The paracentesis was performed. The catheter was removed and a dressing was applied. The patient tolerated the procedure well without immediate post procedural complication. FINDINGS: A total of approximately 600 mL of amber fluid was removed. Samples were sent to the laboratory as requested by the clinical team. IMPRESSION: Successful ultrasound-guided diagnostic and therapeutic paracentesis yielding 600 mL liters of peritoneal fluid. Read by: Brynda Greathouse PA-C Electronically Signed   By: Jacqulynn Cadet M.D.   On: 08/30/2017 14:57   ECHOCARDIOGRAM  ------------------------------------------------------------------- Study Conclusions  - Left ventricle: The cavity size was normal. There was severe   concentric hypertrophy. Systolic function was normal. The   estimated ejection fraction was in the range of 55% to 60%.   Hypokinesis of the anteroseptal and inferoseptal myocardium. - Ventricular septum: The contour showed diastolic flattening and   systolic flattening  consistent with right ventricular pressure   and volume overload. - Aortic valve: Transvalvular velocity was within the normal range.   There was no stenosis. There was no regurgitation. - Mitral valve: Calcified annulus. Moderately thickened leaflets .   Mobility was restricted. The findings are consistent with severe   stenosis. There was mild regurgitation. Mean gradient (D): 16 mm   Hg. Valve area by pressure half-time: 2.01 cm^2. - Left atrium: The atrium was severely dilated. - Right ventricle: The cavity size was severely dilated. Wall   thickness was normal. Systolic function was normal. - Right atrium: The atrium was severely dilated. - Atrial septum: No defect or patent foramen ovale was identified   by color flow Doppler. - Tricuspid valve: There was moderate regurgitation. - Pulmonary arteries: PA peak pressure: 91 mm Hg (S). - Pericardium, extracardiac: A moderate pericardial effusion was   identified along the left ventricular free wall measuring up to   1.64 cm. Unchanged compared with the echo 05/2017. There was no   chamber collapse. There was evidence for increased RV-LV   interaction demonstrated by respirophasic changes in transmitral   velocities and tricuspid velocities. The possibility of   hemodynamic compromise cannot be excluded on the basis of   available images. - Impressions: Severe mitral stenosis by mean gradient. However,   calculated valve area indicates mild stenosis. The leaflets are   restricted but there does not appear to be severe stenosis.  Impressions:  - Severe mitral stenosis by mean gradient. However, calculated   valve area indicates mild stenosis. The leaflets are restricted   but there does not appear to be severe stenosis.  Subjective: Seen this AM and was upset and refusing care. All he wanted was a Paracentesis. I tried to discuss with him about being fully worked up but did not want to listen. Was alert and oriented x3 and of  sound mind and understood the risks of signing out and proceeded to sign out despite Risks.  Discharge Exam: Vitals:   08/30/17 0850 08/30/17 1300  BP: (!) 152/90 121/69  Pulse: 82   Resp: 18   Temp: 98.4 F (36.9 C)   SpO2: 100%    Vitals:   08/30/17 0357 08/30/17 0630 08/30/17 0850 08/30/17 1300  BP: 117/73 (!) 158/90 (!) 152/90 121/69  Pulse: 85 87 82   Resp: 16 18 18    Temp:  98 F (36.7 C) 98.4 F (36.9 C)   TempSrc:  Oral Oral   SpO2: 98% 100% 100%   Weight:  70.9 kg (156 lb 3.2 oz)    Height:  5\' 9"  (1.753 m)     General: Pt is alert, awake, not in acute distress; Agitated Cardiovascular: RRR, Loud Murmur Respiratory: Diminished bilaterally, no wheezing, no rhonchi Abdominal: Soft, Severely distended, bowel sounds + Extremities: no edema, no cyanosis  The results of significant diagnostics from this hospitalization (including imaging, microbiology, ancillary and laboratory) are listed below for reference.    Microbiology: Recent Results (from the past 240 hour(s))  MRSA PCR Screening     Status: None   Collection Time: 08/20/17  9:08 PM  Result Value Ref Range Status   MRSA by PCR NEGATIVE NEGATIVE Final    Comment:        The GeneXpert MRSA Assay (FDA approved for NASAL specimens only), is one component of a comprehensive MRSA colonization surveillance program. It is not intended to diagnose MRSA infection nor to guide or monitor treatment for MRSA infections. Performed at [facility], [place] [address] [place], [place] [address]     Labs: BNP (last 3 results) Recent Labs    07/08/17 1959 08/20/17 1455 08/29/17 1235  BNP 1,100.4* 1,418.4* 323.5*   Basic Metabolic Panel: Recent Labs  Lab 08/29/17 1235 08/30/17 0349  NA 137 134*  K 3.7 4.6  CL 94* 92*  CO2 28 27  GLUCOSE 115* 106*  BUN 27* 38*  CREATININE 3.97* 4.97*  CALCIUM 8.1* 9.1   Liver Function Tests: Recent Labs  Lab 08/29/17 1235  AST 28  ALT 36  ALKPHOS 134*   BILITOT 0.7  PROT 6.7  ALBUMIN 3.5   No results for input(s): LIPASE, AMYLASE in the last 168 hours. No results for input(s): AMMONIA in the last 168 hours. CBC: Recent Labs  Lab 08/29/17 1235 08/30/17 0349  WBC 8.3 8.9  NEUTROABS 6.2  --   HGB 9.2* 9.4*  HCT 26.7* 28.2*  MCV 82.7 83.7  PLT 121* 133*   Cardiac Enzymes: Recent Labs  Lab 08/29/17 1809 08/29/17 2122 08/30/17 0045  TROPONINI 0.07* 0.07* 0.08*   BNP: Invalid input(s): POCBNP CBG: No results for input(s): GLUCAP in the last 168 hours. D-Dimer No results for input(s): DDIMER in the last 72 hours. Hgb A1c No results for input(s): HGBA1C in the last 72 hours. Lipid Profile No results for input(s): CHOL, HDL, LDLCALC, TRIG, CHOLHDL, LDLDIRECT in the last 72 hours. Thyroid function studies No results for input(s): TSH, T4TOTAL, T3FREE, THYROIDAB in the last 72 hours.  Invalid input(s): FREET3 Anemia work up No results for input(s): VITAMINB12, FOLATE, FERRITIN, TIBC, IRON, RETICCTPCT in the last 72 hours. Urinalysis    Component Value Date/Time   COLORURINE YELLOW 07/16/2011 1619   APPEARANCEUR CLEAR 07/16/2011 1619   LABSPEC 1.018 07/16/2011 1619   PHURINE 7.5 07/16/2011 1619   GLUCOSEU 250 (A) 07/16/2011 1619   HGBUR MODERATE (A) 07/16/2011 1619   BILIRUBINUR SMALL (A) 07/16/2011 1619   KETONESUR NEGATIVE 07/16/2011 1619   PROTEINUR >300 (A) 07/16/2011 1619   UROBILINOGEN 1.0 07/16/2011 1619   NITRITE NEGATIVE 07/16/2011 1619   LEUKOCYTESUR SMALL (A) 07/16/2011 1619   Sepsis Labs Invalid input(s): PROCALCITONIN,  WBC,  LACTICIDVEN Microbiology Recent Results (from the past 240 hour(s))  MRSA PCR Screening     Status: None   Collection Time: 08/20/17  9:08 PM  Result Value Ref Range Status   MRSA by PCR NEGATIVE NEGATIVE Final    Comment:  The GeneXpert MRSA Assay (FDA approved for NASAL specimens only), is one component of a comprehensive MRSA colonization surveillance program. It  is not intended to diagnose MRSA infection nor to guide or monitor treatment for MRSA infections. Performed at [facility], [place] [address], [place], [place] [address]    Time coordinating discharge: 25 minutes  SIGNED:  Kerney Elbe, DO [facility] 08/30/2017, 3:06 PM Pager [phone]  If 7PM-7AM, please contact night-coverage [email or web address] Password [email or web address]

## 2017-08-30 NOTE — Progress Notes (Signed)
Pt refused AM blood draw for labs.  Pt continues to refuse tele monitoring.  MD notified.

## 2017-08-31 LAB — TRIGLYCERIDES, BODY FLUIDS: Triglycerides, Fluid: 26 mg/dL

## 2017-08-31 LAB — PATHOLOGIST SMEAR REVIEW

## 2017-09-01 DIAGNOSIS — Z720 Tobacco use: Secondary | ICD-10-CM | POA: Diagnosis not present

## 2017-09-01 DIAGNOSIS — J449 Chronic obstructive pulmonary disease, unspecified: Secondary | ICD-10-CM | POA: Diagnosis not present

## 2017-09-01 DIAGNOSIS — J029 Acute pharyngitis, unspecified: Secondary | ICD-10-CM | POA: Diagnosis not present

## 2017-09-02 ENCOUNTER — Emergency Department (HOSPITAL_COMMUNITY): Payer: Medicare Other

## 2017-09-02 ENCOUNTER — Other Ambulatory Visit: Payer: Self-pay

## 2017-09-02 ENCOUNTER — Inpatient Hospital Stay (HOSPITAL_COMMUNITY)
Admission: EM | Admit: 2017-09-02 | Discharge: 2017-09-03 | DRG: 152 | Disposition: A | Discharge status: Home / Self Care | Payer: Medicare Other | Attending: Internal Medicine | Admitting: Internal Medicine

## 2017-09-02 ENCOUNTER — Encounter (HOSPITAL_COMMUNITY): Payer: Self-pay

## 2017-09-02 DIAGNOSIS — K219 Gastro-esophageal reflux disease without esophagitis: Secondary | ICD-10-CM | POA: Diagnosis present

## 2017-09-02 DIAGNOSIS — I10 Essential (primary) hypertension: Secondary | ICD-10-CM | POA: Diagnosis not present

## 2017-09-02 DIAGNOSIS — I447 Left bundle-branch block, unspecified: Secondary | ICD-10-CM | POA: Diagnosis present

## 2017-09-02 DIAGNOSIS — I2510 Atherosclerotic heart disease of native coronary artery without angina pectoris: Secondary | ICD-10-CM | POA: Diagnosis present

## 2017-09-02 DIAGNOSIS — R188 Other ascites: Secondary | ICD-10-CM | POA: Diagnosis not present

## 2017-09-02 DIAGNOSIS — M19012 Primary osteoarthritis, left shoulder: Secondary | ICD-10-CM | POA: Diagnosis not present

## 2017-09-02 DIAGNOSIS — D62 Acute posthemorrhagic anemia: Secondary | ICD-10-CM | POA: Diagnosis present

## 2017-09-02 DIAGNOSIS — K7460 Unspecified cirrhosis of liver: Secondary | ICD-10-CM | POA: Diagnosis present

## 2017-09-02 DIAGNOSIS — F17210 Nicotine dependence, cigarettes, uncomplicated: Secondary | ICD-10-CM | POA: Diagnosis present

## 2017-09-02 DIAGNOSIS — Z955 Presence of coronary angioplasty implant and graft: Secondary | ICD-10-CM

## 2017-09-02 DIAGNOSIS — F4323 Adjustment disorder with mixed anxiety and depressed mood: Secondary | ICD-10-CM | POA: Diagnosis present

## 2017-09-02 DIAGNOSIS — D649 Anemia, unspecified: Secondary | ICD-10-CM

## 2017-09-02 DIAGNOSIS — Z888 Allergy status to other drugs, medicaments and biological substances status: Secondary | ICD-10-CM

## 2017-09-02 DIAGNOSIS — Z808 Family history of malignant neoplasm of other organs or systems: Secondary | ICD-10-CM

## 2017-09-02 DIAGNOSIS — I4892 Unspecified atrial flutter: Secondary | ICD-10-CM | POA: Diagnosis present

## 2017-09-02 DIAGNOSIS — I120 Hypertensive chronic kidney disease with stage 5 chronic kidney disease or end stage renal disease: Secondary | ICD-10-CM | POA: Diagnosis not present

## 2017-09-02 DIAGNOSIS — F419 Anxiety disorder, unspecified: Secondary | ICD-10-CM | POA: Diagnosis not present

## 2017-09-02 DIAGNOSIS — Z992 Dependence on renal dialysis: Secondary | ICD-10-CM | POA: Diagnosis not present

## 2017-09-02 DIAGNOSIS — Z801 Family history of malignant neoplasm of trachea, bronchus and lung: Secondary | ICD-10-CM

## 2017-09-02 DIAGNOSIS — B181 Chronic viral hepatitis B without delta-agent: Secondary | ICD-10-CM | POA: Diagnosis not present

## 2017-09-02 DIAGNOSIS — Z800 Family history of malignant neoplasm of digestive organs: Secondary | ICD-10-CM

## 2017-09-02 DIAGNOSIS — Z7902 Long term (current) use of antithrombotics/antiplatelets: Secondary | ICD-10-CM

## 2017-09-02 DIAGNOSIS — Z86010 Personal history of colonic polyps: Secondary | ICD-10-CM

## 2017-09-02 DIAGNOSIS — I493 Ventricular premature depolarization: Secondary | ICD-10-CM | POA: Diagnosis not present

## 2017-09-02 DIAGNOSIS — R197 Diarrhea, unspecified: Secondary | ICD-10-CM | POA: Diagnosis not present

## 2017-09-02 DIAGNOSIS — Z79899 Other long term (current) drug therapy: Secondary | ICD-10-CM

## 2017-09-02 DIAGNOSIS — F1410 Cocaine abuse, uncomplicated: Secondary | ICD-10-CM | POA: Diagnosis present

## 2017-09-02 DIAGNOSIS — Z825 Family history of asthma and other chronic lower respiratory diseases: Secondary | ICD-10-CM

## 2017-09-02 DIAGNOSIS — J069 Acute upper respiratory infection, unspecified: Secondary | ICD-10-CM | POA: Diagnosis not present

## 2017-09-02 DIAGNOSIS — Z8249 Family history of ischemic heart disease and other diseases of the circulatory system: Secondary | ICD-10-CM

## 2017-09-02 DIAGNOSIS — I252 Old myocardial infarction: Secondary | ICD-10-CM | POA: Diagnosis not present

## 2017-09-02 DIAGNOSIS — R0602 Shortness of breath: Secondary | ICD-10-CM

## 2017-09-02 DIAGNOSIS — N186 End stage renal disease: Secondary | ICD-10-CM | POA: Diagnosis present

## 2017-09-02 DIAGNOSIS — D509 Iron deficiency anemia, unspecified: Secondary | ICD-10-CM | POA: Diagnosis not present

## 2017-09-02 DIAGNOSIS — F17200 Nicotine dependence, unspecified, uncomplicated: Secondary | ICD-10-CM

## 2017-09-02 DIAGNOSIS — D631 Anemia in chronic kidney disease: Secondary | ICD-10-CM | POA: Diagnosis present

## 2017-09-02 DIAGNOSIS — Z886 Allergy status to analgesic agent status: Secondary | ICD-10-CM

## 2017-09-02 DIAGNOSIS — L299 Pruritus, unspecified: Secondary | ICD-10-CM | POA: Diagnosis not present

## 2017-09-02 DIAGNOSIS — F609 Personality disorder, unspecified: Secondary | ICD-10-CM | POA: Diagnosis not present

## 2017-09-02 DIAGNOSIS — J449 Chronic obstructive pulmonary disease, unspecified: Secondary | ICD-10-CM | POA: Diagnosis not present

## 2017-09-02 DIAGNOSIS — I050 Rheumatic mitral stenosis: Secondary | ICD-10-CM | POA: Diagnosis not present

## 2017-09-02 DIAGNOSIS — Z87442 Personal history of urinary calculi: Secondary | ICD-10-CM

## 2017-09-02 DIAGNOSIS — N2581 Secondary hyperparathyroidism of renal origin: Secondary | ICD-10-CM | POA: Diagnosis not present

## 2017-09-02 DIAGNOSIS — R112 Nausea with vomiting, unspecified: Secondary | ICD-10-CM | POA: Diagnosis not present

## 2017-09-02 DIAGNOSIS — Z885 Allergy status to narcotic agent status: Secondary | ICD-10-CM

## 2017-09-02 DIAGNOSIS — K7469 Other cirrhosis of liver: Secondary | ICD-10-CM | POA: Diagnosis present

## 2017-09-02 DIAGNOSIS — R0981 Nasal congestion: Secondary | ICD-10-CM | POA: Diagnosis present

## 2017-09-02 DIAGNOSIS — R109 Unspecified abdominal pain: Secondary | ICD-10-CM | POA: Diagnosis not present

## 2017-09-02 DIAGNOSIS — I4891 Unspecified atrial fibrillation: Secondary | ICD-10-CM | POA: Diagnosis not present

## 2017-09-02 DIAGNOSIS — B182 Chronic viral hepatitis C: Secondary | ICD-10-CM | POA: Diagnosis present

## 2017-09-02 DIAGNOSIS — R51 Headache: Secondary | ICD-10-CM | POA: Diagnosis not present

## 2017-09-02 DIAGNOSIS — D689 Coagulation defect, unspecified: Secondary | ICD-10-CM | POA: Diagnosis not present

## 2017-09-02 LAB — COMPREHENSIVE METABOLIC PANEL
ALT: 28 U/L (ref 17–63)
AST: 40 U/L (ref 15–41)
Albumin: 3.5 g/dL (ref 3.5–5.0)
Alkaline Phosphatase: 135 U/L — ABNORMAL HIGH (ref 38–126)
Anion gap: 16 — ABNORMAL HIGH (ref 5–15)
BUN: 37 mg/dL — ABNORMAL HIGH (ref 6–20)
CO2: 25 mmol/L (ref 22–32)
Calcium: 8.2 mg/dL — ABNORMAL LOW (ref 8.9–10.3)
Chloride: 93 mmol/L — ABNORMAL LOW (ref 101–111)
Creatinine, Ser: 5.32 mg/dL — ABNORMAL HIGH (ref 0.61–1.24)
GFR calc Af Amer: 13 mL/min — ABNORMAL LOW (ref >60)
GFR calc non Af Amer: 11 mL/min — ABNORMAL LOW (ref >60)
Glucose, Bld: 134 mg/dL — ABNORMAL HIGH (ref 65–99)
Potassium: 4.6 mmol/L (ref 3.5–5.1)
Sodium: 134 mmol/L — ABNORMAL LOW (ref 135–145)
Total Bilirubin: 0.9 mg/dL (ref 0.3–1.2)
Total Protein: 6.7 g/dL (ref 6.5–8.1)

## 2017-09-02 LAB — CBC WITH DIFFERENTIAL/PLATELET
Basophils Absolute: 0 10*3/uL (ref 0.0–0.1)
Basophils Relative: 0 %
Eosinophils Absolute: 0.1 10*3/uL (ref 0.0–0.7)
Eosinophils Relative: 1 %
HCT: 21.7 % — ABNORMAL LOW (ref 39.0–52.0)
Hemoglobin: 7.3 g/dL — ABNORMAL LOW (ref 13.0–17.0)
Lymphocytes Relative: 15 %
Lymphs Abs: 1.3 10*3/uL (ref 0.7–4.0)
MCH: 27 pg (ref 26.0–34.0)
MCHC: 33.6 g/dL (ref 30.0–36.0)
MCV: 80.4 fL (ref 78.0–100.0)
Monocytes Absolute: 0.3 10*3/uL (ref 0.1–1.0)
Monocytes Relative: 3 %
Neutro Abs: 7.2 10*3/uL (ref 1.7–7.7)
Neutrophils Relative %: 81 %
Platelets: 153 10*3/uL (ref 150–400)
RBC: 2.7 MIL/uL — ABNORMAL LOW (ref 4.22–5.81)
RDW: 17.9 % — ABNORMAL HIGH (ref 11.5–15.5)
WBC: 8.9 10*3/uL (ref 4.0–10.5)

## 2017-09-02 LAB — LIPASE, BLOOD: Lipase: 27 U/L (ref 11–51)

## 2017-09-02 LAB — PREPARE RBC (CROSSMATCH)

## 2017-09-02 LAB — TROPONIN I: Troponin I: 0.06 ng/mL (ref <0.03)

## 2017-09-02 MED ORDER — IPRATROPIUM-ALBUTEROL 0.5-2.5 (3) MG/3ML IN SOLN
3.0000 mL | Freq: Once | RESPIRATORY_TRACT | Status: AC
  Administered 2017-09-02: 3 mL via RESPIRATORY_TRACT
  Filled 2017-09-02: qty 3

## 2017-09-02 MED ORDER — MOMETASONE FURO-FORMOTEROL FUM 200-5 MCG/ACT IN AERO
2.0000 | INHALATION_SPRAY | Freq: Two times a day (BID) | RESPIRATORY_TRACT | Status: DC
  Administered 2017-09-02 – 2017-09-03 (×2): 2 via RESPIRATORY_TRACT
  Filled 2017-09-02: qty 8.8

## 2017-09-02 MED ORDER — HEPARIN SODIUM (PORCINE) 5000 UNIT/ML IJ SOLN: 5000.0000 [IU] | Freq: Three times a day (TID) | INTRAMUSCULAR | Status: DC

## 2017-09-02 MED ORDER — SODIUM CHLORIDE 0.9 % IV SOLN
1.0000 g | INTRAVENOUS | Status: DC
  Administered 2017-09-03: 1 g via INTRAVENOUS
  Filled 2017-09-02 (×2): qty 10

## 2017-09-02 MED ORDER — ALBUTEROL SULFATE (2.5 MG/3ML) 0.083% IN NEBU: 2.5000 mg | INHALATION_SOLUTION | Freq: Four times a day (QID) | RESPIRATORY_TRACT | Status: DC | PRN

## 2017-09-02 MED ORDER — GABAPENTIN 100 MG PO CAPS
100.0000 mg | ORAL_CAPSULE | Freq: Two times a day (BID) | ORAL | Status: DC
  Administered 2017-09-02 – 2017-09-03 (×2): 100 mg via ORAL
  Filled 2017-09-02 (×2): qty 1

## 2017-09-02 MED ORDER — NICOTINE 21 MG/24HR TD PT24
21.0000 mg | MEDICATED_PATCH | Freq: Every day | TRANSDERMAL | Status: DC
  Administered 2017-09-02 – 2017-09-03 (×2): 21 mg via TRANSDERMAL
  Filled 2017-09-02 (×2): qty 1

## 2017-09-02 MED ORDER — VITAMIN C 500 MG PO TABS
500.0000 mg | ORAL_TABLET | Freq: Two times a day (BID) | ORAL | Status: DC
  Administered 2017-09-02 – 2017-09-03 (×2): 500 mg via ORAL
  Filled 2017-09-02 (×2): qty 1

## 2017-09-02 MED ORDER — ALBUTEROL SULFATE (2.5 MG/3ML) 0.083% IN NEBU: 5.0000 mg | INHALATION_SOLUTION | Freq: Once | RESPIRATORY_TRACT | Status: DC

## 2017-09-02 MED ORDER — MELATONIN 3 MG PO TABS
3.0000 mg | ORAL_TABLET | Freq: Every evening | ORAL | Status: DC | PRN
  Administered 2017-09-02: 3 mg via ORAL
  Filled 2017-09-02 (×2): qty 1

## 2017-09-02 MED ORDER — CALCITRIOL 0.25 MCG PO CAPS: 0.7500 ug | ORAL_CAPSULE | ORAL | Status: DC

## 2017-09-02 MED ORDER — SEVELAMER CARBONATE 800 MG PO TABS
1600.0000 mg | ORAL_TABLET | Freq: Two times a day (BID) | ORAL | Status: DC | PRN
Start: 2017-09-02 — End: 2017-09-03

## 2017-09-02 MED ORDER — RENA-VITE PO TABS
1.0000 | ORAL_TABLET | Freq: Every day | ORAL | Status: DC
  Administered 2017-09-02: 1 via ORAL
  Filled 2017-09-02: qty 1

## 2017-09-02 MED ORDER — CLOPIDOGREL BISULFATE 75 MG PO TABS: 75.0000 mg | ORAL_TABLET | Freq: Every day | ORAL | Status: DC

## 2017-09-02 MED ORDER — ONDANSETRON HCL 4 MG/2ML IJ SOLN: 4.0000 mg | Freq: Four times a day (QID) | INTRAMUSCULAR | Status: DC | PRN

## 2017-09-02 MED ORDER — ACETAMINOPHEN 325 MG PO TABS: 650.0000 mg | ORAL_TABLET | Freq: Four times a day (QID) | ORAL | Status: DC | PRN

## 2017-09-02 MED ORDER — MINOXIDIL 10 MG PO TABS: 10.0000 mg | ORAL_TABLET | Freq: Every day | ORAL | Status: DC

## 2017-09-02 MED ORDER — SODIUM CHLORIDE 0.9 % IV SOLN
10.0000 mL/h | Freq: Once | INTRAVENOUS | Status: AC
  Administered 2017-09-02: 10 mL/h via INTRAVENOUS

## 2017-09-02 MED ORDER — TIOTROPIUM BROMIDE MONOHYDRATE 18 MCG IN CAPS
18.0000 ug | ORAL_CAPSULE | Freq: Every day | RESPIRATORY_TRACT | Status: DC
  Filled 2017-09-02: qty 5

## 2017-09-02 MED ORDER — DILTIAZEM HCL ER COATED BEADS 120 MG PO CP24
120.0000 mg | ORAL_CAPSULE | Freq: Every day | ORAL | Status: DC
  Administered 2017-09-02 – 2017-09-03 (×2): 120 mg via ORAL
  Filled 2017-09-02 (×2): qty 1

## 2017-09-02 MED ORDER — IPRATROPIUM-ALBUTEROL 0.5-2.5 (3) MG/3ML IN SOLN
3.0000 mL | Freq: Four times a day (QID) | RESPIRATORY_TRACT | Status: DC
  Administered 2017-09-02 – 2017-09-03 (×2): 3 mL via RESPIRATORY_TRACT
  Filled 2017-09-02 (×2): qty 3

## 2017-09-02 MED ORDER — ONDANSETRON HCL 4 MG PO TABS: 4.0000 mg | ORAL_TABLET | Freq: Four times a day (QID) | ORAL | Status: DC | PRN

## 2017-09-02 MED ORDER — IPRATROPIUM-ALBUTEROL 0.5-2.5 (3) MG/3ML IN SOLN
RESPIRATORY_TRACT | Status: AC
  Administered 2017-09-02: 3 mL via RESPIRATORY_TRACT
  Filled 2017-09-02: qty 3

## 2017-09-02 MED ORDER — SENNA 8.6 MG PO TABS: 1.0000 | ORAL_TABLET | Freq: Every day | ORAL | Status: DC | PRN

## 2017-09-02 MED ORDER — OXYCODONE HCL 5 MG PO TABS
5.0000 mg | ORAL_TABLET | Freq: Four times a day (QID) | ORAL | Status: DC | PRN
  Administered 2017-09-02 – 2017-09-03 (×2): 5 mg via ORAL
  Filled 2017-09-02 (×2): qty 1

## 2017-09-02 MED ORDER — ISOSORBIDE MONONITRATE ER 30 MG PO TB24
30.0000 mg | ORAL_TABLET | Freq: Every day | ORAL | Status: DC
  Administered 2017-09-02: 30 mg via ORAL
  Filled 2017-09-02: qty 1

## 2017-09-02 MED ORDER — IOPAMIDOL (ISOVUE-300) INJECTION 61%
INTRAVENOUS | Status: AC
  Administered 2017-09-02: 100 mL
  Filled 2017-09-02: qty 100

## 2017-09-02 MED ORDER — SEVELAMER CARBONATE 800 MG PO TABS
3200.0000 mg | ORAL_TABLET | Freq: Three times a day (TID) | ORAL | Status: DC
  Administered 2017-09-02 – 2017-09-03 (×2): 3200 mg via ORAL
  Filled 2017-09-02 (×2): qty 4

## 2017-09-02 NOTE — ED Triage Notes (Signed)
Pt finished dialysis today with a full treatment and became short of breath after. Pt states his electrolytes were off at dialysis.

## 2017-09-02 NOTE — Progress Notes (Addendum)
Patient with ESRD who presents with symptomatic anemia. Persistent dyspnea after hemodialysis today (M-W-F). Reported patient to be euvolemic. Work up in the ED resulted in HB of 7.3 and Hct 21.7. Requested admission for anemia management. No clinical signs of active bleeding.   Initial orders placed to admit patient to telemetry and transfuse one unit of PRBC. Needs formal admission at [facility]

## 2017-09-02 NOTE — ED Notes (Signed)
Pt refused ti let the lab person draw his blood bank sample  The pt insisted that we draw from the angiocath in his lt forearm  Unable to get a blood return  The pt was told that he would have to get a redraw  Very unhappy

## 2017-09-02 NOTE — ED Notes (Signed)
Pt in ct 

## 2017-09-02 NOTE — H&P (Addendum)
History and Physical    [name] SEG:[id number] DOB: May 25, 1961 DOA: 09/02/2017  PCP: [name], MD  Patient coming from: dialysis center   Chief Complaint: shortness of breath  HPI: [name] is a 57 y.o. male with medical history significant for esrd on dialysis m/w/f, recent admission for suicidal ideation, polysubstance abuse, tobacco abuse, COPD, CAD, hep c and hep b, cirrhosis, anemia, recent hospitalization (12/18) for bleeding and infection of av vistula site since then repaired, recent hospitalization for hypoxic respiratory failure 2/2 volume overload in setting of dialysis need (10/18), recent hospitalization for suicidal ideation (12/18), recent hospitalization (1/19) for hematochezia 2/2 bleeding rectal ulcer (clipped by GI, pt left AMA), recent admission for a-flutter with rvr (2/19) and very recent hospitalization for sob 2/2 large volume ascites (received therapeutic tap, left AMA), presents referred from dialysis clinic for shortness of breath. Finished dialysis session. Says shortness of breath started during dialysis session. Has chronic cough that he doesn't think is much worse from baseline, is not very productive. Denies hematochezia/melena, denies hematemesis. No fevers. No chest pain. Says abdomen uncomfortable, has had few days of loose stools. No vomiting. Also complained to ED that because of cough 2 nights ago ate a small quantity of vicks vapo rup. Poison control called, says dialysis would remove any toxic metabolites, and pt did receive dialysis session today.  ED Course: CT abdomen, labs, 2 units prbcs ordered  Review of Systems: As per HPI otherwise 10 point review of systems negative.    Past Medical History:  Diagnosis Date  . Adenomatous colon polyp    tubular  . Anemia   . Anxiety   . Arthritis    left shoulder  . Atherosclerosis of aorta ([place])   . Cardiomegaly   . Chest pain    DATE UNKNOWN, C/O PERIODICALLY  .  Cocaine abuse ([place])   . COPD exacerbation ([place]) 08/17/2016  . Coronary artery disease    stent 02/22/17  . Dialysis patient ([place])    Monday-Wednesday-Friday  . ESRD (end stage renal disease) on dialysis ([place])    "[name]; MWF" (07/04/2017)  . GERD (gastroesophageal reflux disease)    DATE UNKNOWN  . Hemorrhoids   . Hepatitis B, chronic ([place])   . Hepatitis C   . History of kidney stones   . Hyperkalemia   . Hypertension   . Metabolic bone disease    Patient denies  . Myocardial infarction ([place])   . Pneumonia   . Pulmonary edema   . Renal disorder   . Renal insufficiency   . Shortness of breath dyspnea    " for the last past year with this dialysis"  . Tubular adenoma of colon     Past Surgical History:  Procedure Laterality Date  . A/V FISTULAGRAM Left 05/26/2017   Procedure: A/V FISTULAGRAM;  Surgeon: Conrad Bronson, MD;  Location: [facility];  Service: Cardiovascular;  Laterality: Left;  . APPLICATION OF WOUND VAC Left 06/14/2017   Procedure: APPLICATION OF WOUND VAC;  Surgeon: Katha Cabal, MD;  Location: [facility];  Service: Vascular;  Laterality: Left;  . AV FISTULA PLACEMENT  2012   BELIEVED WAS PLACED IN JUNE  . AV FISTULA PLACEMENT Right 08/09/2017   Procedure: Creation Right arm ARTERIOVENOUS BRACHIOCEPOHALIC FISTULA;  Surgeon: Elam Dutch, MD;  Location: [place];  Service: Vascular;  Laterality: Right;  . COLONOSCOPY    . CORONARY STENT INTERVENTION N/A 02/22/2017   Procedure: CORONARY STENT INTERVENTION;  Surgeon: Nigel Mormon, MD;  Location: [place] [facility];  Service: Cardiovascular;  Laterality: N/A;  . FLEXIBLE SIGMOIDOSCOPY N/A 07/15/2017   Procedure: FLEXIBLE SIGMOIDOSCOPY;  Surgeon: Carol Ada, MD;  Location: [place];  Service: Endoscopy;  Laterality: N/A;  . HEMORRHOID BANDING    . I&D EXTREMITY Left 06/01/2017   Procedure: IRRIGATION AND DEBRIDEMENT LEFT ARM HEMATOMA WITH LIGATION OF LEFT ARM AV FISTULA;  Surgeon: Elam Dutch, MD;  Location: [place];  Service: Vascular;  Laterality: Left;  . I&D EXTREMITY Left 06/14/2017   Procedure: IRRIGATION AND DEBRIDEMENT EXTREMITY;  Surgeon: Katha Cabal, MD;  Location: [facility];  Service: Vascular;  Laterality: Left;  . INSERTION OF DIALYSIS CATHETER  05/30/2017  . INSERTION OF DIALYSIS CATHETER N/A 05/30/2017   Procedure: INSERTION OF DIALYSIS CATHETER;  Surgeon: Elam Dutch, MD;  Location: [place];  Service: Vascular;  Laterality: N/A;  . IR PARACENTESIS  08/30/2017  . LEFT HEART CATH AND CORONARY ANGIOGRAPHY N/A 02/22/2017   Procedure: LEFT HEART CATH AND CORONARY ANGIOGRAPHY;  Surgeon: Nigel Mormon, MD;  Location: [place] [facility];  Service: Cardiovascular;  Laterality: N/A;  . LIGATION OF ARTERIOVENOUS  FISTULA Left 0/03/7352   Procedure: Plication of Left Arm Arteriovenous Fistula;  Surgeon: Elam Dutch, MD;  Location: [place];  Service: Vascular;  Laterality: Left;  . POLYPECTOMY    . REVISON OF ARTERIOVENOUS FISTULA Left 2/99/2426   Procedure: PLICATION OF DISTAL ANEURYSMAL SEGEMENT OF LEFT UPPER ARM ARTERIOVENOUS FISTULA;  Surgeon: Elam Dutch, MD;  Location: [place];  Service: Vascular;  Laterality: Left;  . REVISON OF ARTERIOVENOUS FISTULA Left 8/34/1962   Procedure: Plication of Left Upper Arm Fistula ;  Surgeon: Waynetta Sandy, MD;  Location: [place];  Service: Vascular;  Laterality: Left;  . SKIN GRAFT SPLIT THICKNESS LEG / FOOT Left    SKIN GRAFT SPLIT THICKNESS LEFT ARM DONOR SITE: LEFT ANTERIOR THIGH  . SKIN SPLIT GRAFT Left 07/04/2017   Procedure: SKIN GRAFT SPLIT THICKNESS LEFT ARM DONOR SITE: LEFT ANTERIOR THIGH;  Surgeon: Elam Dutch, MD;  Location: [place];  Service: Vascular;  Laterality: Left;  . THROMBECTOMY W/ EMBOLECTOMY Left 06/05/2017   Procedure: EXPLORATION OF LEFT ARM FOR BLEEDING; OVERSEWED PROXIMAL FISTULA;  Surgeon: Angelia Mould, MD;  Location: [place];  Service: Vascular;  Laterality: Left;    . WOUND EXPLORATION Left 06/03/2017   Procedure: WOUND EXPLORATION WITH WOUND VAC APPLICATION TO LEFT ARM;  Surgeon: Angelia Mould, MD;  Location: [place];  Service: Vascular;  Laterality: Left;     reports that he has been smoking cigarettes.  He started smoking about 44 years ago. He has a 21.50 pack-year smoking history. he has never used smokeless tobacco. He reports that he drinks alcohol. He reports that he uses drugs. Drugs: Marijuana and Cocaine.  Allergies  Allergen Reactions  . Aspirin Other (See Comments)    STOMACH PAIN  . Clonidine Derivatives Itching  . Tramadol Itching    Family History  Problem Relation Age of Onset  . Heart disease Mother   . Lung cancer Mother   . Heart disease Father   . Malignant hyperthermia Father   . COPD Father   . Throat cancer Sister   . Esophageal cancer Sister   . Hypertension Other   . COPD Other   . Colon cancer Neg Hx   . Colon polyps Neg Hx   . Rectal cancer Neg Hx   .  Stomach cancer Neg Hx     Prior to Admission medications   Medication Sig Start Date End Date Taking? Authorizing Provider  acetaminophen (TYLENOL) 325 MG tablet Take 2 tablets (650 mg total) by mouth every 6 (six) hours as needed for mild pain (or Fever >/= 101). 06/23/17   Shahmehdi, Valeria [place], MD  albuterol (VENTOLIN HFA) 108 (90 Base) MCG/ACT inhaler Inhale 2 puffs into the lungs every 6 (six) hours as needed for wheezing or shortness of breath.    [provider]  budesonide-formoterol (SYMBICORT) 160-4.5 MCG/ACT inhaler Inhale 2 puffs into the lungs every 12 (twelve) hours.    [provider]  calcitRIOL (ROCALTROL) 0.25 MCG capsule Take 3 capsules (0.75 mcg total) by mouth every Monday, Wednesday, and Friday with hemodialysis. 03/28/17   Cherene Altes, MD  clopidogrel (PLAVIX) 75 MG tablet Take 1 tablet (75 mg total) by mouth daily. 07/15/17   Rhyne, Hulen Shouts, PA-C  diltiazem (CARDIZEM CD) 120 MG 24 hr capsule Take 1 capsule (120  mg total) by mouth daily. 06/23/17 06/23/18  ShahmehdiValeria [place], MD  gabapentin (NEURONTIN) 100 MG capsule Take 100 mg 2 (two) times daily by mouth.    [provider]  isosorbide mononitrate (IMDUR) 30 MG 24 hr tablet Take 1 tablet (30 mg total) by mouth daily. 06/17/17   [name], MD  Melatonin 5 MG TABS Take 5 mg by mouth at bedtime as needed (sleep).    [provider]  minoxidil (LONITEN) 10 MG tablet Take 10 mg by mouth daily.    [provider]  multivitamin (RENA-VIT) TABS tablet Take 1 tablet by mouth at bedtime. 06/17/17   [name], MD  nicotine (NICODERM CQ - DOSED IN MG/24 HOURS) 21 mg/24hr patch Place 21 mg onto the skin daily.    [provider]  oxyCODONE (ROXICODONE) 5 MG immediate release tablet Take 1 tablet (5 mg total) by mouth every 8 (eight) hours as needed for moderate pain. 08/09/17   Rhyne, Hulen Shouts, PA-C  senna (SENOKOT) 8.6 MG TABS tablet Take 1 tablet (8.6 mg total) by mouth daily as needed for mild constipation. 06/10/17   Mariel Aloe, MD  sevelamer carbonate (RENVELA) 800 MG tablet Take 4 tablets (3,200 mg total) by mouth 3 (three) times daily with meals. Patient taking differently: Take 3,200 mg by mouth See admin instructions. 3,200 mg by mouth three times a day with meals and 1,600 mg two times a day with snacks 06/10/17   Mariel Aloe, MD  Spacer/Aero-Holding Chambers (AEROCHAMBER MV) inhaler Use as instructed 03/10/17   Javier Glazier, MD  tiotropium (SPIRIVA) 18 MCG inhalation capsule Place 1 capsule (18 mcg total) into inhaler and inhale daily. 07/16/17   Dagoberto Ligas, PA-C  vitamin C (VITAMIN C) 500 MG tablet Take 1 tablet (500 mg total) by mouth 2 (two) times daily. 06/17/17   [name], MD  zolpidem (AMBIEN) 10 MG tablet Take 1 tablet (10 mg total) by mouth at bedtime as needed for sleep. 06/10/17   Mariel Aloe, MD    Physical Exam: Vitals:   09/02/17 1600 09/02/17 1936 09/02/17 1959 09/02/17 2031  BP:  117/63 104/79 111/75 102/61  Pulse: (!) 112 (!) 110 (!) 112 (!) 104  Resp: 14 18 18 20   Temp:  98.8 F (37.1 C) 98.6 F (37 C) 97.9 F (36.6 C)  TempSrc:  Oral Axillary Oral  SpO2: 100% 98% 100% 96%  Weight:      Height:  Constitutional: No acute distress, chronically ill appearing Head: Atraumatic Eyes: Conjunctiva clear ENM: Moist mucous membranes. Poor dentition.  Neck: Supple Respiratory: decreased breath sounds at bases, scattered expiratory wheezes Cardiovascular: tachycardic, regular rhythm. Soft systolic murmur Abdomen: distended, mild diffuse tenderness Musculoskeletal: No joint deformity upper and lower extremities. Normal ROM, no contractures. Decreased muscle tone.  Skin: left brachiocephalic fistula,  Extremities: No peripheral edema. Palpable peripheral pulses. Neurologic: Alert, moving all 4 extremities. Psychiatric: argumentative GU: refused  Labs on Admission: I have personally reviewed following labs and imaging studies  CBC: Recent Labs  Lab 08/29/17 1235 08/30/17 0349 09/02/17 1348  WBC 8.3 8.9 8.9  NEUTROABS 6.2  --  7.2  HGB 9.2* 9.4* 7.3*  HCT 26.7* 28.2* 21.7*  MCV 82.7 83.7 80.4  PLT 121* 133* 381   Basic Metabolic Panel: Recent Labs  Lab 08/29/17 1235 08/30/17 0349 09/02/17 1348  NA 137 134* 134*  K 3.7 4.6 4.6  CL 94* 92* 93*  CO2 28 27 25   GLUCOSE 115* 106* 134*  BUN 27* 38* 37*  CREATININE 3.97* 4.97* 5.32*  CALCIUM 8.1* 9.1 8.2*   GFR: Estimated Creatinine Clearance: 15.6 mL/min (A) (by C-G formula based on SCr of 5.32 mg/dL (H)). Liver Function Tests: Recent Labs  Lab 08/29/17 1235 09/02/17 1348  AST 28 40  ALT 36 28  ALKPHOS 134* 135*  BILITOT 0.7 0.9  PROT 6.7 6.7  ALBUMIN 3.5 3.5   Recent Labs  Lab 09/02/17 1348  LIPASE 27   No results for input(s): AMMONIA in the last 168 hours. Coagulation Profile: Recent Labs  Lab 08/29/17 1235  INR 1.12   Cardiac Enzymes: Recent Labs  Lab 08/29/17 1809  08/29/17 2122 08/30/17 0045 09/02/17 1348  TROPONINI 0.07* 0.07* 0.08* 0.06*   BNP (last 3 results) No results for input(s): PROBNP in the last 8760 hours. HbA1C: No results for input(s): HGBA1C in the last 72 hours. CBG: No results for input(s): GLUCAP in the last 168 hours. Lipid Profile: No results for input(s): CHOL, HDL, LDLCALC, TRIG, CHOLHDL, LDLDIRECT in the last 72 hours. Thyroid Function Tests: No results for input(s): TSH, T4TOTAL, FREET4, T3FREE, THYROIDAB in the last 72 hours. Anemia Panel: No results for input(s): VITAMINB12, FOLATE, FERRITIN, TIBC, IRON, RETICCTPCT in the last 72 hours. Urine analysis:    Component Value Date/Time   COLORURINE YELLOW 07/16/2011 1619   APPEARANCEUR CLEAR 07/16/2011 1619   LABSPEC 1.018 07/16/2011 1619   PHURINE 7.5 07/16/2011 1619   GLUCOSEU 250 (A) 07/16/2011 1619   HGBUR MODERATE (A) 07/16/2011 1619   BILIRUBINUR SMALL (A) 07/16/2011 1619   KETONESUR NEGATIVE 07/16/2011 1619   PROTEINUR >300 (A) 07/16/2011 1619   UROBILINOGEN 1.0 07/16/2011 1619   NITRITE NEGATIVE 07/16/2011 1619   LEUKOCYTESUR SMALL (A) 07/16/2011 1619    Radiological Exams on Admission: Dg Chest 2 View  Result Date: 09/02/2017 CLINICAL DATA:  Shortness of breath. Renal failure, receiving dialysis. EXAM: CHEST  2 VIEW COMPARISON:  August 29, 2017 FINDINGS: Central catheter tip is in the right atrium. No pneumothorax. There is no edema or consolidation. There is generalized cardiomegaly with pulmonary vascularity normal. No adenopathy. There is aortic atherosclerosis. No evident bone lesions. IMPRESSION: Cardiomegaly. Superimposed pericardial effusion cannot be excluded. No edema or consolidation. Pulmonary vascularity normal. Aortic atherosclerosis. Central catheter tip in right atrium.  No pneumothorax. Aortic Atherosclerosis (ICD10-I70.0). Electronically Signed   By: Lowella Grip III M.D.   On: 09/02/2017 12:15   Ct Abdomen Pelvis W Contrast  Result  Date: 09/02/2017 CLINICAL DATA:  Abdominal pain for 3 days. EXAM: CT ABDOMEN AND PELVIS WITH CONTRAST TECHNIQUE: Multidetector CT imaging of the abdomen and pelvis was performed using the standard protocol following bolus administration of intravenous contrast. CONTRAST:  119mL ISOVUE-300 IOPAMIDOL (ISOVUE-300) INJECTION 61% COMPARISON:  07/15/2017 FINDINGS: Lower chest: New trace pleural effusions bilaterally. Subsegmental atelectasis in both lung bases. Cardiomegaly and coronary artery atherosclerosis with similar appearance of a small pericardial effusion. Dialysis catheter in the right atrium. Hepatobiliary: No focal liver abnormality is seen. No gallstones or biliary dilatation. Pancreas: Unremarkable. Spleen: Unremarkable. Adrenals/Urinary Tract: Unremarkable adrenal glands. Similar appearance of marked bilateral renal atrophy and small bilateral low-density renal lesions. No excreted contrast on delayed imaging. No hydronephrosis. Unremarkable bladder. Stomach/Bowel: The stomach is within normal limits. Fluid-filled nondilated small bowel loops in the lower abdomen and pelvis. No evidence of bowel obstruction or gross bowel wall thickening. A hemostatic clip is present in the rectum. Vascular/Lymphatic: Extensive atherosclerotic calcification of the abdominal aorta and its major branch vessels. No abdominal aortic aneurysm. No enlarged lymph nodes. Reproductive: Unremarkable prostate. Other: Large volume ascites, similar to prior. No pneumoperitoneum. No abdominal wall hernia. Musculoskeletal: Diffusely increased osseous density likely reflecting renal osteodystrophy. Focally advanced disc degeneration at L5-S1. IMPRESSION: 1. Large volume ascites, similar to prior. 2. New trace bilateral pleural effusions and bibasilar subsegmental atelectasis. 3. Small pericardial effusion. 4.  Aortic Atherosclerosis (ICD10-I70.0). Electronically Signed   By: [name] Bores M.D.   On: 09/02/2017 16:36    EKG: Independently  reviewed. Stable LBB. Mild tachycardia  Assessment/Plan Active Problems:   Tobacco use disorder   Chronic hepatitis B (HCC)   Chronic hepatitis C without hepatic coma (H[facility])   End stage renal disease ([facility])   COPD GOLD II/ still smoking   Essential hypertension   Anemia   Ascites   CKD (chronic kidney disease) stage V requiring chronic dialysis ([facility])   History of Cocaine abuse ([place])   Symptomatic anemia   Other cirrhosis of liver ([facility])   Left bundle branch block   Mitral stenosis   # Acute blood loss anemia - denies rectal bleeding but recent admission for bleeding rectal ulcer. H has dropped from 12 2 wks ago to 7 today. Refused rectal exam from ED provider and myself, has not yet given stool. But no other source apparent, including CT abdomen w/ stable large-volume ascites and no signs intra-abdominal bleeding. Hemodynamically stable. New mild pleural effusions and large volume ascites likely contribute to SOB. Trop mildly elevated but is at baseline (in setting of esrd on dialysis) - 2 units PRBCs ordered - f/u cbc in AM - will need GI consult - plavix held - sbp ppx as below  # COPD - here complains of cough, though says chronic, no fevers, no increased sputum. Do not think this represents acute exacerbation. Does have new mild pleural effusions likely 2/2 large volume ascites - duonebs q6 - continue home tiotropium and dulera  # ESRD on dialysis - dialysis MWF, received dialysis today. Here K and CO2 wnl. Does have large-volume ascites, which appear to be chronic.  - nephro consult to continue mwf dialysis - continue home calcitriol and sevelamer  # cirrhosis # hepatitis b # hepatitis c # ascites - therapeutic tap 3 days ago, analysis negative for infection and malignincy. Significant abdominal ascites persists today. No significant abd pain, ams, or fever to suggest sbp. Cirrhosis likely causative, though elevated protein in ascitic fluid suggests possible cardiac cause. 2/19  tte shows  severe mitral stenosis (see below) - consider GI referral for resistant ascites, will also appreciate nephro recs - given GI bleeding will start start sbp ppx with ceftriaxone  # mitral stenosis - severe, seen on tte 3 days ago. Could contribute to ascites. Likely 2/2 RHD. - consider cardiology consult inpatient  # Polysubstance abuse - denies recent drug use. Is a regular smoker - nicotine patch  # HTN - here bp low normal - continue home diltiazem for rate control; hold home isosorbide and minoxidil in setting of low-normal BPs and acute blood loss anemia  # a-flutter - s/p cardioversion, not anticoagulated 2/2 non-adherence - continue home diltiazem  DVT prophylaxis: scds, holding on medical in setting of active bleed Code Status: full  Family Communication: none  Disposition Plan: tbd  Consults called: none  Admission status: tele    Desma Maxim MD [facility] Pager [phone]  If 7PM-7AM, please contact night-coverage [email or web address] Password [facility]  09/02/2017, 9:16 PM

## 2017-09-02 NOTE — ED Notes (Signed)
Dinner tray requested.

## 2017-09-02 NOTE — ED Notes (Signed)
Pt up to the br unco-operative.  Graham crackers given as requisted

## 2017-09-02 NOTE — ED Provider Notes (Signed)
[facility] Provider Note   CSN: [id number] Arrival date & time: 09/02/17  1125     History   Chief Complaint Chief Complaint  Patient presents with  . Shortness of Breath    HPI [name] is a 57 y.o. male with history of end-stage renal disease on dialysis MWF, COPD, hepatitis, MI who presents with shortness of breath that began not long into dialysis today.  Patient also reports generalized abdominal pain and over the past 2 days after eating no more than a spoonful of mixed VapoRub for his chest congestion and coughing.  He has had associated nausea and vomiting as well.  Patient missed dialysis on Wednesday.  Patient denies chest pain.  HPI  Past Medical History:  Diagnosis Date  . Adenomatous colon polyp    tubular  . Anemia   . Anxiety   . Arthritis    left shoulder  . Atherosclerosis of aorta ([place])   . Cardiomegaly   . Chest pain    DATE UNKNOWN, C/O PERIODICALLY  . Cocaine abuse ([place])   . COPD exacerbation ([place]) 08/17/2016  . Coronary artery disease    stent 02/22/17  . Dialysis patient ([place])    Monday-Wednesday-Friday  . ESRD (end stage renal disease) on dialysis ([place])    "[name]; MWF" (07/04/2017)  . GERD (gastroesophageal reflux disease)    DATE UNKNOWN  . Hemorrhoids   . Hepatitis B, chronic ([place])   . Hepatitis C   . History of kidney stones   . Hyperkalemia   . Hypertension   . Metabolic bone disease    Patient denies  . Myocardial infarction ([place])   . Pneumonia   . Pulmonary edema   . Renal disorder   . Renal insufficiency   . Shortness of breath dyspnea    " for the last past year with this dialysis"  . Tubular adenoma of colon     Patient Active Problem List   Diagnosis Date Noted  . Symptomatic anemia 09/02/2017  . Other cirrhosis of liver ([place]) 09/02/2017  . Left bundle branch block 09/02/2017  . Mitral stenosis 09/02/2017  . Hematochezia 07/15/2017  . Wide-complex tachycardia ([place])   .  Endotracheally intubated   . ESRD (end stage renal disease) ([place]) 07/04/2017  . Acute respiratory failure with hypoxia ([place]) 06/18/2017  . CKD (chronic kidney disease) stage V requiring chronic dialysis ([place]) 06/18/2017  . History of Cocaine abuse ([place]) 06/18/2017  . Hypertension 06/18/2017  . Infection of AV graft for dialysis ([place]) 06/18/2017  . Anxiety 06/18/2017  . Anemia due to chronic kidney disease 06/18/2017  . Atrial flutter with rapid ventricular response ([place]) 06/18/2017  . Personality disorder ([place]) 06/13/2017  . Cellulitis 06/12/2017  . Adjustment disorder with mixed anxiety and depressed mood 06/10/2017  . Suicidal ideation 06/10/2017  . Arm wound, left, sequela 06/10/2017  . Dyspnea 05/29/2017  . Tachycardia 05/29/2017  . Hyperkalemia 05/22/2017  . Acute metabolic encephalopathy   . Anemia 04/23/2017  . Ascites 04/23/2017  . COPD (chronic obstructive pulmonary disease) ([place]) 04/23/2017  . Acute on chronic respiratory failure with hypoxia ([place]) 03/25/2017  . Arrhythmia 03/25/2017  . COPD GOLD II/ still smoking 09/27/2016  . Essential hypertension 09/27/2016  . Fluid overload 08/30/2016  . COPD exacerbation ([place]) 08/17/2016  . Hypertensive urgency 08/17/2016  . Respiratory failure ([place]) 08/17/2016  . Problem with dialysis access ([place]) 07/23/2016  . End stage renal disease ([place]) 12/02/2015  . Chronic hepatitis B ([place])  03/05/2014  . Chronic hepatitis C without hepatic coma ([place]) 03/05/2014  . Internal hemorrhoids with bleeding, swelling and itching 03/05/2014  . Thrombocytopenia ([place]) 03/05/2014  . Chest pain 02/27/2014  . Alcohol abuse 04/14/2009  . Tobacco use disorder 04/14/2009  . GANGLION CYST 04/14/2009    Past Surgical History:  Procedure Laterality Date  . A/V FISTULAGRAM Left 05/26/2017   Procedure: A/V FISTULAGRAM;  Surgeon: Conrad [place], MD;  Location: [place] [facility];  Service: Cardiovascular;  Laterality: Left;  . APPLICATION OF WOUND VAC Left  06/14/2017   Procedure: APPLICATION OF WOUND VAC;  Surgeon: Katha Cabal, MD;  Location: [facility];  Service: Vascular;  Laterality: Left;  . AV FISTULA PLACEMENT  2012   BELIEVED WAS PLACED IN JUNE  . AV FISTULA PLACEMENT Right 08/09/2017   Procedure: Creation Right arm ARTERIOVENOUS BRACHIOCEPOHALIC FISTULA;  Surgeon: Elam Dutch, MD;  Location: [place];  Service: Vascular;  Laterality: Right;  . COLONOSCOPY    . CORONARY STENT INTERVENTION N/A 02/22/2017   Procedure: CORONARY STENT INTERVENTION;  Surgeon: Nigel Mormon, MD;  Location: [place] [facility];  Service: Cardiovascular;  Laterality: N/A;  . FLEXIBLE SIGMOIDOSCOPY N/A 07/15/2017   Procedure: FLEXIBLE SIGMOIDOSCOPY;  Surgeon: Carol Ada, MD;  Location: [place];  Service: Endoscopy;  Laterality: N/A;  . HEMORRHOID BANDING    . I&D EXTREMITY Left 06/01/2017   Procedure: IRRIGATION AND DEBRIDEMENT LEFT ARM HEMATOMA WITH LIGATION OF LEFT ARM AV FISTULA;  Surgeon: Elam Dutch, MD;  Location: [place];  Service: Vascular;  Laterality: Left;  . I&D EXTREMITY Left 06/14/2017   Procedure: IRRIGATION AND DEBRIDEMENT EXTREMITY;  Surgeon: Katha Cabal, MD;  Location: [facility];  Service: Vascular;  Laterality: Left;  . INSERTION OF DIALYSIS CATHETER  05/30/2017  . INSERTION OF DIALYSIS CATHETER N/A 05/30/2017   Procedure: INSERTION OF DIALYSIS CATHETER;  Surgeon: Elam Dutch, MD;  Location: [place];  Service: Vascular;  Laterality: N/A;  . IR PARACENTESIS  08/30/2017  . LEFT HEART CATH AND CORONARY ANGIOGRAPHY N/A 02/22/2017   Procedure: LEFT HEART CATH AND CORONARY ANGIOGRAPHY;  Surgeon: Nigel Mormon, MD;  Location: [place] [facility];  Service: Cardiovascular;  Laterality: N/A;  . LIGATION OF ARTERIOVENOUS  FISTULA Left 07/15/863   Procedure: Plication of Left Arm Arteriovenous Fistula;  Surgeon: Elam Dutch, MD;  Location: [place];  Service: Vascular;  Laterality: Left;  . POLYPECTOMY    . REVISON OF  ARTERIOVENOUS FISTULA Left 7/84/6962   Procedure: PLICATION OF DISTAL ANEURYSMAL SEGEMENT OF LEFT UPPER ARM ARTERIOVENOUS FISTULA;  Surgeon: Elam Dutch, MD;  Location: [place];  Service: Vascular;  Laterality: Left;  . REVISON OF ARTERIOVENOUS FISTULA Left 9/52/8413   Procedure: Plication of Left Upper Arm Fistula ;  Surgeon: Waynetta Sandy, MD;  Location: [place];  Service: Vascular;  Laterality: Left;  . SKIN GRAFT SPLIT THICKNESS LEG / FOOT Left    SKIN GRAFT SPLIT THICKNESS LEFT ARM DONOR SITE: LEFT ANTERIOR THIGH  . SKIN SPLIT GRAFT Left 07/04/2017   Procedure: SKIN GRAFT SPLIT THICKNESS LEFT ARM DONOR SITE: LEFT ANTERIOR THIGH;  Surgeon: Elam Dutch, MD;  Location: [place];  Service: Vascular;  Laterality: Left;  . THROMBECTOMY W/ EMBOLECTOMY Left 06/05/2017   Procedure: EXPLORATION OF LEFT ARM FOR BLEEDING; OVERSEWED PROXIMAL FISTULA;  Surgeon: Angelia Mould, MD;  Location: [place];  Service: Vascular;  Laterality: Left;  . WOUND EXPLORATION Left 06/03/2017   Procedure: WOUND EXPLORATION  WITH WOUND VAC APPLICATION TO LEFT ARM;  Surgeon: Angelia Mould, MD;  Location: [facility];  Service: Vascular;  Laterality: Left;       Home Medications    Prior to Admission medications   Medication Sig Start Date End Date Taking? Authorizing Provider  acetaminophen (TYLENOL) 325 MG tablet Take 2 tablets (650 mg total) by mouth every 6 (six) hours as needed for mild pain (or Fever >/= 101). 06/23/17   Shahmehdi, Valeria Batman, MD  albuterol (VENTOLIN HFA) 108 (90 Base) MCG/ACT inhaler Inhale 2 puffs into the lungs every 6 (six) hours as needed for wheezing or shortness of breath.    [provider]  budesonide-formoterol (SYMBICORT) 160-4.5 MCG/ACT inhaler Inhale 2 puffs into the lungs every 12 (twelve) hours.    [provider]  calcitRIOL (ROCALTROL) 0.25 MCG capsule Take 3 capsules (0.75 mcg total) by mouth every Monday, Wednesday, and Friday with  hemodialysis. 03/28/17   Cherene Altes, MD  clopidogrel (PLAVIX) 75 MG tablet Take 1 tablet (75 mg total) by mouth daily. 07/15/17   Rhyne, Hulen Shouts, PA-C  diltiazem (CARDIZEM CD) 120 MG 24 hr capsule Take 1 capsule (120 mg total) by mouth daily. 06/23/17 06/23/18  ShahmehdiValeria Batman, MD  gabapentin (NEURONTIN) 100 MG capsule Take 100 mg 2 (two) times daily by mouth.    [provider]  isosorbide mononitrate (IMDUR) 30 MG 24 hr tablet Take 1 tablet (30 mg total) by mouth daily. 06/17/17   [name], MD  Melatonin 5 MG TABS Take 5 mg by mouth at bedtime as needed (sleep).    [provider]  minoxidil (LONITEN) 10 MG tablet Take 10 mg by mouth daily.    [provider]  multivitamin (RENA-VIT) TABS tablet Take 1 tablet by mouth at bedtime. 06/17/17   [name], MD  nicotine (NICODERM CQ - DOSED IN MG/24 HOURS) 21 mg/24hr patch Place 21 mg onto the skin daily.    [provider]  oxyCODONE (ROXICODONE) 5 MG immediate release tablet Take 1 tablet (5 mg total) by mouth every 8 (eight) hours as needed for moderate pain. 08/09/17   Rhyne, Hulen Shouts, PA-C  senna (SENOKOT) 8.6 MG TABS tablet Take 1 tablet (8.6 mg total) by mouth daily as needed for mild constipation. 06/10/17   Mariel Aloe, MD  sevelamer carbonate (RENVELA) 800 MG tablet Take 4 tablets (3,200 mg total) by mouth 3 (three) times daily with meals. Patient taking differently: Take 3,200 mg by mouth See admin instructions. 3,200 mg by mouth three times a day with meals and 1,600 mg two times a day with snacks 06/10/17   Mariel Aloe, MD  Spacer/Aero-Holding Chambers (AEROCHAMBER MV) inhaler Use as instructed 03/10/17   Javier Glazier, MD  tiotropium (SPIRIVA) 18 MCG inhalation capsule Place 1 capsule (18 mcg total) into inhaler and inhale daily. 07/16/17   Dagoberto Ligas, PA-C  vitamin C (VITAMIN C) 500 MG tablet Take 1 tablet (500 mg total) by mouth 2 (two) times daily. 06/17/17   [name], MD    zolpidem (AMBIEN) 10 MG tablet Take 1 tablet (10 mg total) by mouth at bedtime as needed for sleep. 06/10/17   Mariel Aloe, MD    Family History Family History  Problem Relation Age of Onset  . Heart disease Mother   . Lung cancer Mother   . Heart disease Father   . Malignant hyperthermia Father   . COPD Father   . Throat cancer  Sister   . Esophageal cancer Sister   . Hypertension Other   . COPD Other   . Colon cancer Neg Hx   . Colon polyps Neg Hx   . Rectal cancer Neg Hx   . Stomach cancer Neg Hx     Social History Social History   Tobacco Use  . Smoking status: Current Every Day Smoker    Packs/day: 0.50    Years: 43.00    Pack years: 21.50    Types: Cigarettes    Start date: 08/13/1973  . Smokeless tobacco: Never Used  Substance Use Topics  . Alcohol use: Yes    Frequency: Never    Comment: quit drinking in 2017  . Drug use: Yes    Types: Marijuana, Cocaine    Comment: quit in 2017"     Allergies   Aspirin; Clonidine derivatives; and Tramadol   Review of Systems Review of Systems  Constitutional: Negative for chills and fever.  HENT: Negative for facial swelling and sore throat.   Respiratory: Positive for shortness of breath.   Cardiovascular: Negative for chest pain.  Gastrointestinal: Positive for abdominal pain, diarrhea, nausea and vomiting.  Genitourinary: Negative for dysuria.  Musculoskeletal: Negative for back pain.  Skin: Negative for rash and wound.  Neurological: Negative for headaches.  Psychiatric/Behavioral: The patient is not nervous/anxious.      Physical Exam Updated Vital Signs BP 102/61 (BP Location: Right Leg)   Pulse (!) 104   Temp 97.9 F (36.6 C) (Oral)   Resp 20   Ht 5\' 9"  (1.753 m)   Wt 74 kg (163 lb 1.6 oz)   SpO2 96%   BMI 24.09 kg/m   Physical Exam  Constitutional: He appears well-developed and well-nourished. No distress.  HENT:  Head: Normocephalic and atraumatic.  Mouth/Throat: Oropharynx is clear  and moist. No oropharyngeal exudate.  Eyes: Conjunctivae are normal. Pupils are equal, round, and reactive to light. Right eye exhibits no discharge. Left eye exhibits no discharge. No scleral icterus.  Neck: Normal range of motion. Neck supple. No thyromegaly present.  Cardiovascular: Normal rate, regular rhythm, normal heart sounds and intact distal pulses. Exam reveals no gallop and no friction rub.  No murmur heard. Pulmonary/Chest: Effort normal and breath sounds normal. No stridor. No respiratory distress. He has no wheezes. He has no rales.  Abdominal: Soft. Bowel sounds are normal. He exhibits ascites. He exhibits no distension. There is generalized tenderness. There is no rebound and no guarding.  Musculoskeletal: He exhibits no edema.  Lymphadenopathy:    He has no cervical adenopathy.  Neurological: He is alert. Coordination normal.  Skin: Skin is warm and dry. No rash noted. He is not diaphoretic. No pallor.  Psychiatric: He has a normal mood and affect.  Nursing note and vitals reviewed.    ED Treatments / Results  Labs (all labs ordered are listed, but only abnormal results are displayed) Labs Reviewed  COMPREHENSIVE METABOLIC PANEL - Abnormal; Notable for the following components:      Result Value   Sodium 134 (*)    Chloride 93 (*)    Glucose, Bld 134 (*)    BUN 37 (*)    Creatinine, Ser 5.32 (*)    Calcium 8.2 (*)    Alkaline Phosphatase 135 (*)    GFR calc non Af Amer 11 (*)    GFR calc Af Amer 13 (*)    Anion gap 16 (*)    All other components within normal limits  CBC  WITH DIFFERENTIAL/PLATELET - Abnormal; Notable for the following components:   RBC 2.70 (*)    Hemoglobin 7.3 (*)    HCT 21.7 (*)    RDW 17.9 (*)    All other components within normal limits  TROPONIN I - Abnormal; Notable for the following components:   Troponin I 0.06 (*)    All other components within normal limits  LIPASE, BLOOD  COMPREHENSIVE METABOLIC PANEL  IRON AND TIBC   FERRITIN  TRANSFERRIN  CBC  TYPE AND SCREEN  PREPARE RBC (CROSSMATCH)    EKG  EKG Interpretation  Date/Time:  Friday September 02 2017 11:35:19 EST Ventricular Rate:  107 PR Interval:    QRS Duration: 161 QT Interval:  434 QTC Calculation: 580 R Axis:   12 Text Interpretation:  Sinus tachycardia Atrial premature complexes IVCD, consider atypical LBBB Confirmed by Nat Christen [phone]) on 09/02/2017 11:42:33 AM       Radiology Dg Chest 2 View  Result Date: 09/02/2017 CLINICAL DATA:  Shortness of breath. Renal failure, receiving dialysis. EXAM: CHEST  2 VIEW COMPARISON:  August 29, 2017 FINDINGS: Central catheter tip is in the right atrium. No pneumothorax. There is no edema or consolidation. There is generalized cardiomegaly with pulmonary vascularity normal. No adenopathy. There is aortic atherosclerosis. No evident bone lesions. IMPRESSION: Cardiomegaly. Superimposed pericardial effusion cannot be excluded. No edema or consolidation. Pulmonary vascularity normal. Aortic atherosclerosis. Central catheter tip in right atrium.  No pneumothorax. Aortic Atherosclerosis (ICD10-I70.0). Electronically Signed   By: Lowella Grip III M.D.   On: 09/02/2017 12:15   Ct Abdomen Pelvis W Contrast  Result Date: 09/02/2017 CLINICAL DATA:  Abdominal pain for 3 days. EXAM: CT ABDOMEN AND PELVIS WITH CONTRAST TECHNIQUE: Multidetector CT imaging of the abdomen and pelvis was performed using the standard protocol following bolus administration of intravenous contrast. CONTRAST:  169mL ISOVUE-300 IOPAMIDOL (ISOVUE-300) INJECTION 61% COMPARISON:  07/15/2017 FINDINGS: Lower chest: New trace pleural effusions bilaterally. Subsegmental atelectasis in both lung bases. Cardiomegaly and coronary artery atherosclerosis with similar appearance of a small pericardial effusion. Dialysis catheter in the right atrium. Hepatobiliary: No focal liver abnormality is seen. No gallstones or biliary dilatation. Pancreas:  Unremarkable. Spleen: Unremarkable. Adrenals/Urinary Tract: Unremarkable adrenal glands. Similar appearance of marked bilateral renal atrophy and small bilateral low-density renal lesions. No excreted contrast on delayed imaging. No hydronephrosis. Unremarkable bladder. Stomach/Bowel: The stomach is within normal limits. Fluid-filled nondilated small bowel loops in the lower abdomen and pelvis. No evidence of bowel obstruction or gross bowel wall thickening. A hemostatic clip is present in the rectum. Vascular/Lymphatic: Extensive atherosclerotic calcification of the abdominal aorta and its major branch vessels. No abdominal aortic aneurysm. No enlarged lymph nodes. Reproductive: Unremarkable prostate. Other: Large volume ascites, similar to prior. No pneumoperitoneum. No abdominal wall hernia. Musculoskeletal: Diffusely increased osseous density likely reflecting renal osteodystrophy. Focally advanced disc degeneration at L5-S1. IMPRESSION: 1. Large volume ascites, similar to prior. 2. New trace bilateral pleural effusions and bibasilar subsegmental atelectasis. 3. Small pericardial effusion. 4.  Aortic Atherosclerosis (ICD10-I70.0). Electronically Signed   By: [name] Bores M.D.   On: 09/02/2017 16:36    Procedures Procedures (including critical care time)  Medications Ordered in ED Medications  acetaminophen (TYLENOL) tablet 650 mg (not administered)  albuterol (PROVENTIL) (2.5 MG/3ML) 0.083% nebulizer solution 2.5 mg (not administered)  mometasone-formoterol (DULERA) 200-5 MCG/ACT inhaler 2 puff (2 puffs Inhalation Given 09/02/17 2057)  calcitRIOL (ROCALTROL) capsule 0.75 mcg (not administered)  clopidogrel (PLAVIX) tablet 75 mg (not administered)  diltiazem (CARDIZEM  CD) 24 hr capsule 120 mg (120 mg Oral Given 09/02/17 2205)  gabapentin (NEURONTIN) capsule 100 mg (100 mg Oral Given 09/02/17 2148)  isosorbide mononitrate (IMDUR) 24 hr tablet 30 mg (30 mg Oral Given 09/02/17 2205)  Melatonin TABS 3 mg  (3 mg Oral Given 09/02/17 2149)  minoxidil (LONITEN) tablet 10 mg (not administered)  multivitamin (RENA-VIT) tablet 1 tablet (1 tablet Oral Given 09/02/17 2148)  nicotine (NICODERM CQ - dosed in mg/24 hours) patch 21 mg (21 mg Transdermal Patch Applied 09/02/17 2206)  senna (SENOKOT) tablet 8.6 mg (not administered)  sevelamer carbonate (RENVELA) tablet 3,200 mg (3,200 mg Oral Given 09/02/17 2206)  vitamin C (ASCORBIC ACID) tablet 500 mg (500 mg Oral Given 09/02/17 2149)  heparin injection 5,000 Units (5,000 Units Subcutaneous Not Given 09/02/17 2208)  ondansetron (ZOFRAN) tablet 4 mg (not administered)    Or  ondansetron (ZOFRAN) injection 4 mg (not administered)  ipratropium-albuterol (DUONEB) 0.5-2.5 (3) MG/3ML nebulizer solution 3 mL (3 mLs Nebulization Given 09/02/17 2051)  cefTRIAXone (ROCEPHIN) 1 g in sodium chloride 0.9 % 100 mL IVPB (not administered)  sevelamer carbonate (RENVELA) tablet 1,600 mg (not administered)  oxyCODONE (Oxy IR/ROXICODONE) immediate release tablet 5 mg (5 mg Oral Given 09/02/17 2149)  ipratropium-albuterol (DUONEB) 0.5-2.5 (3) MG/3ML nebulizer solution 3 mL (3 mLs Nebulization Given 09/02/17 1433)  iopamidol (ISOVUE-300) 61 % injection (100 mLs  Contrast Given 09/02/17 1614)  0.9 %  sodium chloride infusion (10 mL/hr Intravenous New Bag/Given 09/02/17 1955)     Initial Impression / Assessment and Plan / ED Course  I have reviewed the triage vital signs and the nursing notes.  Pertinent labs & imaging results that were available during my care of the patient were reviewed by me and considered in my medical decision making (see chart for details).  Clinical Course as of Sep 03 2311  Fri Sep 02, 8178  515 57 year old male end-stage renal sent in after dialysis for increased shortness of breath.  The patient has moderate work of breathing.  He denies any infectious symptoms.  He is nontoxic-appearing but is persistently tachycardic.  He has a right [place] access.  He  is getting lab work and EKG troponin and imaging.  [MB]  1093 I spoke with poison control RN, Elaina Pattee, who advised that Vicks VapoRub by mouth could cause nausea, vomiting, diarrhea, seizures.  It is excreted through the kidneys normally in 90-120 min in normal patient.  Although patient does have end-stage renal disease, considering dialysis today, poison control advised no further workup for VapoRub ingestion from their perspective.  [AL]    Clinical Course User Index [AL] Frederica Kuster, PA-C [MB] Hayden Rasmussen, MD    Patient presenting with shortness of breath and 2 g drop in hemoglobin.  Patient refused rectal exam.  Patient also noted to have new trace bilateral pleural effusions and bibasilar subsegmental atelectasis.  CMP shows BUN 37, creatinine 5.37, sodium 134, chloride 93.  Troponin 0 0.06, but chronically elevated.  Dialysis received today.  Patient agrees to transfusion.  Will admit to medicine.  I spoke with Dr. Annia Friendly with [facility] who will admit the patient for further management.  Patient also evaluated by Dr. Melina Copa who guided the patient's management and agrees with plan.  Final Clinical Impressions(s) / ED Diagnoses   Final diagnoses:  Symptomatic anemia  Shortness of breath    ED Discharge Orders    None       Frederica Kuster, PA-C 09/02/17 2313  Hayden Rasmussen, MD 09/03/17 1048

## 2017-09-02 NOTE — ED Notes (Signed)
The pt has pulled off his 02 sat bp cuff and does not want to be hooked back up

## 2017-09-02 NOTE — ED Notes (Signed)
He wants to go home

## 2017-09-02 NOTE — ED Notes (Signed)
Pt eatting dinner at this time and lisening to music on his phone.

## 2017-09-02 NOTE — ED Notes (Signed)
The pt is alert he wants food

## 2017-09-02 NOTE — ED Notes (Signed)
Pt just returned from c-t 

## 2017-09-02 NOTE — ED Notes (Signed)
Pa given tropinin report

## 2017-09-03 DIAGNOSIS — B182 Chronic viral hepatitis C: Secondary | ICD-10-CM

## 2017-09-03 DIAGNOSIS — I050 Rheumatic mitral stenosis: Secondary | ICD-10-CM | POA: Diagnosis not present

## 2017-09-03 DIAGNOSIS — R0981 Nasal congestion: Secondary | ICD-10-CM | POA: Diagnosis present

## 2017-09-03 DIAGNOSIS — Z992 Dependence on renal dialysis: Secondary | ICD-10-CM | POA: Diagnosis not present

## 2017-09-03 DIAGNOSIS — I4892 Unspecified atrial flutter: Secondary | ICD-10-CM | POA: Diagnosis not present

## 2017-09-03 DIAGNOSIS — I10 Essential (primary) hypertension: Secondary | ICD-10-CM | POA: Diagnosis not present

## 2017-09-03 DIAGNOSIS — D62 Acute posthemorrhagic anemia: Secondary | ICD-10-CM | POA: Diagnosis not present

## 2017-09-03 DIAGNOSIS — D631 Anemia in chronic kidney disease: Secondary | ICD-10-CM | POA: Diagnosis not present

## 2017-09-03 DIAGNOSIS — I252 Old myocardial infarction: Secondary | ICD-10-CM | POA: Diagnosis not present

## 2017-09-03 DIAGNOSIS — N186 End stage renal disease: Secondary | ICD-10-CM | POA: Diagnosis not present

## 2017-09-03 DIAGNOSIS — K219 Gastro-esophageal reflux disease without esophagitis: Secondary | ICD-10-CM | POA: Diagnosis not present

## 2017-09-03 DIAGNOSIS — R0602 Shortness of breath: Secondary | ICD-10-CM | POA: Diagnosis not present

## 2017-09-03 DIAGNOSIS — K7460 Unspecified cirrhosis of liver: Secondary | ICD-10-CM | POA: Diagnosis not present

## 2017-09-03 DIAGNOSIS — J449 Chronic obstructive pulmonary disease, unspecified: Secondary | ICD-10-CM | POA: Diagnosis not present

## 2017-09-03 DIAGNOSIS — B181 Chronic viral hepatitis B without delta-agent: Secondary | ICD-10-CM | POA: Diagnosis not present

## 2017-09-03 DIAGNOSIS — I2510 Atherosclerotic heart disease of native coronary artery without angina pectoris: Secondary | ICD-10-CM | POA: Diagnosis not present

## 2017-09-03 DIAGNOSIS — R188 Other ascites: Secondary | ICD-10-CM | POA: Diagnosis not present

## 2017-09-03 DIAGNOSIS — I447 Left bundle-branch block, unspecified: Secondary | ICD-10-CM | POA: Diagnosis not present

## 2017-09-03 DIAGNOSIS — J069 Acute upper respiratory infection, unspecified: Secondary | ICD-10-CM | POA: Diagnosis not present

## 2017-09-03 DIAGNOSIS — I120 Hypertensive chronic kidney disease with stage 5 chronic kidney disease or end stage renal disease: Secondary | ICD-10-CM | POA: Diagnosis not present

## 2017-09-03 LAB — COMPREHENSIVE METABOLIC PANEL
ALT: 27 U/L (ref 17–63)
AST: 34 U/L (ref 15–41)
Albumin: 3.7 g/dL (ref 3.5–5.0)
Alkaline Phosphatase: 140 U/L — ABNORMAL HIGH (ref 38–126)
Anion gap: 17 — ABNORMAL HIGH (ref 5–15)
BUN: 43 mg/dL — ABNORMAL HIGH (ref 6–20)
CO2: 25 mmol/L (ref 22–32)
Calcium: 8.9 mg/dL (ref 8.9–10.3)
Chloride: 92 mmol/L — ABNORMAL LOW (ref 101–111)
Creatinine, Ser: 6.29 mg/dL — ABNORMAL HIGH (ref 0.61–1.24)
GFR calc Af Amer: 10 mL/min — ABNORMAL LOW (ref >60)
GFR calc non Af Amer: 9 mL/min — ABNORMAL LOW (ref >60)
Glucose, Bld: 98 mg/dL (ref 65–99)
Potassium: 4.9 mmol/L (ref 3.5–5.1)
Sodium: 134 mmol/L — ABNORMAL LOW (ref 135–145)
Total Bilirubin: 0.9 mg/dL (ref 0.3–1.2)
Total Protein: 7.3 g/dL (ref 6.5–8.1)

## 2017-09-03 LAB — CBC
HCT: 24.8 % — ABNORMAL LOW (ref 39.0–52.0)
Hemoglobin: 8.6 g/dL — ABNORMAL LOW (ref 13.0–17.0)
MCH: 27.8 pg (ref 26.0–34.0)
MCHC: 34.7 g/dL (ref 30.0–36.0)
MCV: 80.3 fL (ref 78.0–100.0)
Platelets: 142 10*3/uL — ABNORMAL LOW (ref 150–400)
RBC: 3.09 MIL/uL — ABNORMAL LOW (ref 4.22–5.81)
RDW: 17 % — ABNORMAL HIGH (ref 11.5–15.5)
WBC: 9 10*3/uL (ref 4.0–10.5)

## 2017-09-03 LAB — IRON AND TIBC
Iron: 104 ug/dL (ref 45–182)
Saturation Ratios: 30 % (ref 17.9–39.5)
TIBC: 343 ug/dL (ref 250–450)
UIBC: 239 ug/dL

## 2017-09-03 LAB — TRANSFERRIN: Transferrin: 245 mg/dL (ref 180–329)

## 2017-09-03 LAB — FERRITIN: Ferritin: 513 ng/mL — ABNORMAL HIGH (ref 24–336)

## 2017-09-03 LAB — PREPARE RBC (CROSSMATCH)

## 2017-09-03 MED ORDER — GUAIFENESIN-CODEINE 100-10 MG/5ML PO SOLN: 5.0000 mL | ORAL | 0 refills | Status: DC | PRN

## 2017-09-03 MED ORDER — GUAIFENESIN-CODEINE 100-10 MG/5ML PO SOLN: 5.0000 mL | ORAL | Status: DC | PRN

## 2017-09-03 MED ORDER — AMOXICILLIN-POT CLAVULANATE 875-125 MG PO TABS
1.0000 | ORAL_TABLET | Freq: Two times a day (BID) | ORAL | Status: DC
  Administered 2017-09-03: 1 via ORAL
  Filled 2017-09-03: qty 1

## 2017-09-03 MED ORDER — SODIUM CHLORIDE 0.9 % IV SOLN: Freq: Once | INTRAVENOUS | Status: DC

## 2017-09-03 MED ORDER — AMOXICILLIN-POT CLAVULANATE 875-125 MG PO TABS: 1.0000 | ORAL_TABLET | Freq: Two times a day (BID) | ORAL | 0 refills | Status: DC

## 2017-09-03 NOTE — Discharge Summary (Signed)
Physician Discharge Summary  [name] PNT:[id number] DOB: 04-29-1961 DOA: 09/02/2017  PCP: [name], MD  Admit date: 09/02/2017 Discharge date: 09/03/2017  Admitted From: home Disposition:  home  Recommendations for Outpatient Follow-up:  1. Follow up with HD as scheduled  Home Health: none Equipment/Devices: none  Discharge Condition: stable CODE STATUS: Full code Diet recommendation: regular  HPI: Per Dr. Si Raider, [name] is a 57 y.o. male with medical history significant for esrd on dialysis m/w/f, recent admission for suicidal ideation, polysubstance abuse, tobacco abuse, COPD, CAD, hep c and hep b, cirrhosis, anemia, recent hospitalization (12/18) for bleeding and infection of av vistula site since then repaired, recent hospitalization for hypoxic respiratory failure 2/2 volume overload in setting of dialysis need (10/18), recent hospitalization for suicidal ideation (12/18), recent hospitalization (1/19) for hematochezia 2/2 bleeding rectal ulcer (clipped by GI, pt left AMA), recent admission for a-flutter with rvr (2/19) and very recent hospitalization for sob 2/2 large volume ascites (received therapeutic tap, left AMA), presents referred from dialysis clinic for shortness of breath. Finished dialysis session. Says shortness of breath started during dialysis session. Has chronic cough that he doesn't think is much worse from baseline, is not very productive. Denies hematochezia/melena, denies hematemesis. No fevers. No chest pain. Says abdomen uncomfortable, has had few days of loose stools. No vomiting. Also complained to ED that because of cough 2 nights ago ate a small quantity of vicks vapo rup. Poison control called, says dialysis would remove any toxic metabolites, and pt did receive dialysis session today.   Hospital Course: URI - He was admitted to the hospital with URI type symptoms, shortness of breath, sinus pressure.  He was placed on  symptomatic treatment with antitussives, decongestions as well as Augmentin.  He improved, is comfortable on room air, ambulating in the hallway without difficulties, asking to go home.  Will provide a short course of antibiotics. Anemia related to end-stage renal disease -on admission due to concern about patient having acute blood loss anemia.  He recently had a lower GI bleed in January 2019 with rectal ulcers and clips were placed.  Currently denies any blood in his stools or any dark tarry stools.  His hemoglobin on admission was 7.3, patient's baseline hemoglobin is 8-9 however has had a hemoglobin is in the mid sevens at baseline as well.  He was given a unit of blood transfusion, improved appropriately at 8.6.  Without any evidence of lower GI bleed, this is likely related to his renal disease and appears somewhat stable compared to his baseline. COPD -due to ongoing tobacco abuse, no wheezing, stable, resume home medications End-stage renal disease on dialysis -received dialysis as scheduled yesterday, Is due on Monday.  Patient states that he is a little bit below his dry weight and appears euvolemic on exam (although has a degree of ascites) Liver cirrhosis with hep B and C -Decompensated with ascites, he had paracentesis 3 days ago and the analysis was negative.  Has a degree of ascites currently however this appears to be chronic for him Mitral stenosis -outpatient management, currently appears asymptomatic Polysubstance abuse -denies any recent drug use.  He is a regular smoker. Hypertension -resume home medications A flutter -status post cardioversion, not on anticoagulation due to nonadherence.   Discharge Diagnoses:  Active Problems:   Tobacco use disorder   Chronic hepatitis B (HCC)   Chronic hepatitis C without hepatic coma (HCC)   End stage renal disease (HCC)   COPD GOLD  II/ still smoking   Essential hypertension   Anemia   Ascites   CKD (chronic kidney disease) stage V  requiring chronic dialysis (HCC)   History of Cocaine abuse ([place])   Symptomatic anemia   Other cirrhosis of liver (HCC)   Left bundle branch block   Mitral stenosis   Sinus congestion     Discharge Instructions   Allergies as of 09/03/2017      Reactions   Aspirin Other (See Comments)   STOMACH PAIN   Clonidine Derivatives Itching   Tramadol Itching      Medication List    TAKE these medications   acetaminophen 325 MG tablet Commonly known as:  TYLENOL Take 2 tablets (650 mg total) by mouth every 6 (six) hours as needed for mild pain (or Fever >/= 101).   AEROCHAMBER MV inhaler Use as instructed   amoxicillin-clavulanate 875-125 MG tablet Commonly known as:  AUGMENTIN Take 1 tablet by mouth every 12 (twelve) hours.   ascorbic acid 500 MG tablet Commonly known as:  VITAMIN C Take 1 tablet (500 mg total) by mouth 2 (two) times daily.   budesonide-formoterol 160-4.5 MCG/ACT inhaler Commonly known as:  SYMBICORT Inhale 2 puffs into the lungs every 12 (twelve) hours.   calcitRIOL 0.25 MCG capsule Commonly known as:  ROCALTROL Take 3 capsules (0.75 mcg total) by mouth every Monday, Wednesday, and Friday with hemodialysis.   clopidogrel 75 MG tablet Commonly known as:  PLAVIX Take 1 tablet (75 mg total) by mouth daily.   diltiazem 120 MG 24 hr capsule Commonly known as:  CARDIZEM CD Take 1 capsule (120 mg total) by mouth daily.   gabapentin 100 MG capsule Commonly known as:  NEURONTIN Take 100 mg 2 (two) times daily by mouth.   guaiFENesin-codeine 100-10 MG/5ML syrup Take 5 mLs by mouth every 4 (four) hours as needed for cough (congestion).   isosorbide mononitrate 30 MG 24 hr tablet Commonly known as:  IMDUR Take 1 tablet (30 mg total) by mouth daily.   Melatonin 5 MG Tabs Take 5 mg by mouth at bedtime as needed (sleep).   minoxidil 10 MG tablet Commonly known as:  LONITEN Take 10 mg by mouth daily.   multivitamin Tabs tablet Take 1 tablet by mouth  at bedtime.   nicotine 21 mg/24hr patch Commonly known as:  NICODERM CQ - dosed in mg/24 hours Place 21 mg onto the skin daily.   oxyCODONE 5 MG immediate release tablet Commonly known as:  ROXICODONE Take 1 tablet (5 mg total) by mouth every 8 (eight) hours as needed for moderate pain.   senna 8.6 MG Tabs tablet Commonly known as:  SENOKOT Take 1 tablet (8.6 mg total) by mouth daily as needed for mild constipation.   sevelamer carbonate 800 MG tablet Commonly known as:  RENVELA Take 4 tablets (3,200 mg total) by mouth 3 (three) times daily with meals. What changed:    when to take this  additional instructions   tiotropium 18 MCG inhalation capsule Commonly known as:  SPIRIVA Place 1 capsule (18 mcg total) into inhaler and inhale daily.   VENTOLIN HFA 108 (90 Base) MCG/ACT inhaler Generic drug:  albuterol Inhale 2 puffs into the lungs every 6 (six) hours as needed for wheezing or shortness of breath.   zolpidem 10 MG tablet Commonly known as:  AMBIEN Take 1 tablet (10 mg total) by mouth at bedtime as needed for sleep.        Consultations:  None  Procedures/Studies:  Dg Chest 2 View  Result Date: 09/02/2017 CLINICAL DATA:  Shortness of breath. Renal failure, receiving dialysis. EXAM: CHEST  2 VIEW COMPARISON:  August 29, 2017 FINDINGS: Central catheter tip is in the right atrium. No pneumothorax. There is no edema or consolidation. There is generalized cardiomegaly with pulmonary vascularity normal. No adenopathy. There is aortic atherosclerosis. No evident bone lesions. IMPRESSION: Cardiomegaly. Superimposed pericardial effusion cannot be excluded. No edema or consolidation. Pulmonary vascularity normal. Aortic atherosclerosis. Central catheter tip in right atrium.  No pneumothorax. Aortic Atherosclerosis (ICD10-I70.0). Electronically Signed   By: Lowella Grip III M.D.   On: 09/02/2017 12:15   Ct Abdomen Pelvis W Contrast  Result Date: 09/02/2017 CLINICAL  DATA:  Abdominal pain for 3 days. EXAM: CT ABDOMEN AND PELVIS WITH CONTRAST TECHNIQUE: Multidetector CT imaging of the abdomen and pelvis was performed using the standard protocol following bolus administration of intravenous contrast. CONTRAST:  178mL ISOVUE-300 IOPAMIDOL (ISOVUE-300) INJECTION 61% COMPARISON:  07/15/2017 FINDINGS: Lower chest: New trace pleural effusions bilaterally. Subsegmental atelectasis in both lung bases. Cardiomegaly and coronary artery atherosclerosis with similar appearance of a small pericardial effusion. Dialysis catheter in the right atrium. Hepatobiliary: No focal liver abnormality is seen. No gallstones or biliary dilatation. Pancreas: Unremarkable. Spleen: Unremarkable. Adrenals/Urinary Tract: Unremarkable adrenal glands. Similar appearance of marked bilateral renal atrophy and small bilateral low-density renal lesions. No excreted contrast on delayed imaging. No hydronephrosis. Unremarkable bladder. Stomach/Bowel: The stomach is within normal limits. Fluid-filled nondilated small bowel loops in the lower abdomen and pelvis. No evidence of bowel obstruction or gross bowel wall thickening. A hemostatic clip is present in the rectum. Vascular/Lymphatic: Extensive atherosclerotic calcification of the abdominal aorta and its major branch vessels. No abdominal aortic aneurysm. No enlarged lymph nodes. Reproductive: Unremarkable prostate. Other: Large volume ascites, similar to prior. No pneumoperitoneum. No abdominal wall hernia. Musculoskeletal: Diffusely increased osseous density likely reflecting renal osteodystrophy. Focally advanced disc degeneration at L5-S1. IMPRESSION: 1. Large volume ascites, similar to prior. 2. New trace bilateral pleural effusions and bibasilar subsegmental atelectasis. 3. Small pericardial effusion. 4.  Aortic Atherosclerosis (ICD10-I70.0). Electronically Signed   By: [name] Bores M.D.   On: 09/02/2017 16:36   Dg Chest Port 1 View  Result Date:  08/20/2017 CLINICAL DATA:  Chest pain.  Renal failure EXAM: PORTABLE CHEST 1 VIEW COMPARISON:  July 14, 2017 FINDINGS: Central catheter tip is in the right atrium. No pneumothorax. There is a small left pleural effusion with mild left base atelectasis. Lungs elsewhere are clear. Heart is enlarged with mild pulmonary venous hypertension. There is aortic atherosclerosis. No adenopathy. No bone lesions. There is calcification in both carotid arteries. There is also calcification in the left and right subclavian arteries. IMPRESSION: Central catheter tip in right atrium. No pneumothorax. Small left pleural effusion with left base atelectasis. No frank edema or consolidation. There is underlying pulmonary vascular congestion. There is aortic atherosclerosis as well as extensive carotid and subclavian artery calcification. Aortic Atherosclerosis (ICD10-I70.0). Electronically Signed   By: Lowella Grip III M.D.   On: 08/20/2017 15:11   Dg Abdomen Acute W/chest  Result Date: 08/29/2017 CLINICAL DATA:  Chest pain and shortness of breath following dialysis EXAM: DG ABDOMEN ACUTE W/ 1V CHEST COMPARISON:  08/20/2017 FINDINGS: Cardiac shadow remains enlarged. Dialysis catheter is again seen and stable. Aortic calcifications are seen. The lungs are well aerated bilaterally. No focal infiltrate or effusion is seen. Stable vascular prominence is noted likely related to a degree of volume overload. Scattered large  and small bowel gas is noted. No obstructive changes are seen. Endoscopic clip is noted over the lower pelvis. Diffuse vascular calcifications are seen. No free air is noted. IMPRESSION: No acute abnormality noted. Electronically Signed   By: Inez Catalina M.D.   On: 08/29/2017 13:29   Ir Paracentesis  Result Date: 08/30/2017 INDICATION: Patient with history of end-stage renal disease, now with abdominal distension. Request is made for diagnostic and therapeutic paracentesis. EXAM: ULTRASOUND GUIDED DIAGNOSTIC  AND THERAPEUTIC PARACENTESIS MEDICATIONS: 10 mL 2% lidocaine COMPLICATIONS: None immediate. PROCEDURE: Informed written consent was obtained from the patient after a discussion of the risks, benefits and alternatives to treatment. A timeout was performed prior to the initiation of the procedure. Initial ultrasound scanning demonstrates a small amount of ascites within the right lower abdominal quadrant. The right lower abdomen was prepped and draped in the usual sterile fashion. 2% lidocaine was used for local anesthesia. Following this, a 19 gauge, 7-cm, Yueh catheter was introduced. An ultrasound image was saved for documentation purposes. The paracentesis was performed. The catheter was removed and a dressing was applied. The patient tolerated the procedure well without immediate post procedural complication. FINDINGS: A total of approximately 600 mL of amber fluid was removed. Samples were sent to the laboratory as requested by the clinical team. IMPRESSION: Successful ultrasound-guided diagnostic and therapeutic paracentesis yielding 600 mL liters of peritoneal fluid. Read by: Brynda Greathouse PA-C Electronically Signed   By: Jacqulynn Cadet M.D.   On: 08/30/2017 14:57      Subjective: - no chest pain, shortness of breath, no abdominal pain, nausea or vomiting.   Discharge Exam: Vitals:   09/03/17 0606 09/03/17 0827  BP: 124/88   Pulse: (!) 102   Resp: 18   Temp: 98.4 F (36.9 C)   SpO2: 98% 97%    General: Pt is alert, awake, not in acute distress Cardiovascular: RRR, S1/S2 +, no rubs, no gallops Respiratory: CTA bilaterally, no wheezing, no rhonchi Abdominal: Soft, NT, ND, bowel sounds +, slightly distended Extremities: no edema, no cyanosis    The results of significant diagnostics from this hospitalization (including imaging, microbiology, ancillary and laboratory) are listed below for reference.     Microbiology: Recent Results (from the past 240 hour(s))  Gram stain      Status: None   Collection Time: 08/30/17  2:15 PM  Result Value Ref Range Status   Specimen Description PERITONEAL  Final   Special Requests NONE  Final   Gram Stain   Final    FEW WBC PRESENT, PREDOMINANTLY MONONUCLEAR NO ORGANISMS SEEN Performed at [facility], [address], [place], [place] [address]    Report Status 08/30/2017 FINAL  Final  Culture, body fluid-bottle     Status: None (Preliminary result)   Collection Time: 08/30/17  2:15 PM  Result Value Ref Range Status   Specimen Description PERITONEAL  Final   Special Requests NONE  Final   Culture   Final    NO GROWTH 4 DAYS Performed at [facility] [address] [place], [place] [address]    Report Status PENDING  Incomplete     Labs: BNP (last 3 results) Recent Labs    07/08/17 1959 08/20/17 1455 08/29/17 1235  BNP 1,100.4* 1,418.4* 160.1*   Basic Metabolic Panel: Recent Labs  Lab 08/29/17 1235 08/30/17 0349 09/02/17 1348 09/03/17 0800  NA 137 134* 134* 134*  K 3.7 4.6 4.6 4.9  CL 94* 92* 93* 92*  CO2  28 27 25 25   GLUCOSE 115* 106* 134* 98  BUN 27* 38* 37* 43*  CREATININE 3.97* 4.97* 5.32* 6.29*  CALCIUM 8.1* 9.1 8.2* 8.9   Liver Function Tests: Recent Labs  Lab 08/29/17 1235 09/02/17 1348 09/03/17 0800  AST 28 40 34  ALT 36 28 27  ALKPHOS 134* 135* 140*  BILITOT 0.7 0.9 0.9  PROT 6.7 6.7 7.3  ALBUMIN 3.5 3.5 3.7   Recent Labs  Lab 09/02/17 1348  LIPASE 27   No results for input(s): AMMONIA in the last 168 hours. CBC: Recent Labs  Lab 08/29/17 1235 08/30/17 0349 09/02/17 1348 09/03/17 0800  WBC 8.3 8.9 8.9 9.0  NEUTROABS 6.2  --  7.2  --   HGB 9.2* 9.4* 7.3* 8.6*  HCT 26.7* 28.2* 21.7* 24.8*  MCV 82.7 83.7 80.4 80.3  PLT 121* 133* 153 142*   Cardiac Enzymes: Recent Labs  Lab 08/29/17 1809 08/29/17 2122 08/30/17 0045 09/02/17 1348  TROPONINI 0.07* 0.07* 0.08* 0.06*   BNP: Invalid input(s): POCBNP CBG: No results for input(s): GLUCAP in the last  168 hours. D-Dimer No results for input(s): DDIMER in the last 72 hours. Hgb A1c No results for input(s): HGBA1C in the last 72 hours. Lipid Profile No results for input(s): CHOL, HDL, LDLCALC, TRIG, CHOLHDL, LDLDIRECT in the last 72 hours. Thyroid function studies No results for input(s): TSH, T4TOTAL, T3FREE, THYROIDAB in the last 72 hours.  Invalid input(s): FREET3 Anemia work up Recent Labs    09/03/17 0800  FERRITIN 513*  TIBC 343  IRON 104   Urinalysis    Component Value Date/Time   COLORURINE YELLOW 07/16/2011 1619   APPEARANCEUR CLEAR 07/16/2011 1619   LABSPEC 1.018 07/16/2011 1619   PHURINE 7.5 07/16/2011 1619   GLUCOSEU 250 (A) 07/16/2011 1619   HGBUR MODERATE (A) 07/16/2011 1619   BILIRUBINUR SMALL (A) 07/16/2011 1619   KETONESUR NEGATIVE 07/16/2011 1619   PROTEINUR >300 (A) 07/16/2011 1619   UROBILINOGEN 1.0 07/16/2011 1619   NITRITE NEGATIVE 07/16/2011 1619   LEUKOCYTESUR SMALL (A) 07/16/2011 1619   Sepsis Labs Invalid input(s): PROCALCITONIN,  WBC,  LACTICIDVEN   Time coordinating discharge: 35 minutes  SIGNED:  Marzetta Board, MD  [facility] 09/03/2017, 2:48 PM Pager [phone]  If 7PM-7AM, please contact night-coverage [email or web address] Password [email or web address]

## 2017-09-04 LAB — CULTURE, BODY FLUID W GRAM STAIN -BOTTLE: Culture: NO GROWTH

## 2017-09-04 LAB — TYPE AND SCREEN
ABO/RH(D): O NEG
Antibody Screen: NEGATIVE
Unit division: 0
Unit division: 0
Unit division: 0

## 2017-09-04 LAB — BPAM RBC
Blood Product Expiration Date: 201903072359
Blood Product Expiration Date: 201903112359
Blood Product Expiration Date: 201903252359
ISSUE DATE / TIME: 201902221920
ISSUE DATE / TIME: 201902230323
Unit Type and Rh: 9500
Unit Type and Rh: 9500
Unit Type and Rh: 9500

## 2017-09-05 DIAGNOSIS — N2581 Secondary hyperparathyroidism of renal origin: Secondary | ICD-10-CM | POA: Diagnosis not present

## 2017-09-05 DIAGNOSIS — L299 Pruritus, unspecified: Secondary | ICD-10-CM | POA: Diagnosis not present

## 2017-09-05 DIAGNOSIS — I4891 Unspecified atrial fibrillation: Secondary | ICD-10-CM | POA: Diagnosis not present

## 2017-09-05 DIAGNOSIS — D509 Iron deficiency anemia, unspecified: Secondary | ICD-10-CM | POA: Diagnosis not present

## 2017-09-05 DIAGNOSIS — R51 Headache: Secondary | ICD-10-CM | POA: Diagnosis not present

## 2017-09-05 DIAGNOSIS — D689 Coagulation defect, unspecified: Secondary | ICD-10-CM | POA: Diagnosis not present

## 2017-09-05 DIAGNOSIS — N186 End stage renal disease: Secondary | ICD-10-CM | POA: Diagnosis not present

## 2017-09-05 DIAGNOSIS — D631 Anemia in chronic kidney disease: Secondary | ICD-10-CM | POA: Diagnosis not present

## 2017-09-06 DIAGNOSIS — R51 Headache: Secondary | ICD-10-CM | POA: Diagnosis not present

## 2017-09-06 DIAGNOSIS — N2581 Secondary hyperparathyroidism of renal origin: Secondary | ICD-10-CM | POA: Diagnosis not present

## 2017-09-06 DIAGNOSIS — E8770 Fluid overload, unspecified: Secondary | ICD-10-CM | POA: Diagnosis not present

## 2017-09-06 DIAGNOSIS — D689 Coagulation defect, unspecified: Secondary | ICD-10-CM | POA: Diagnosis not present

## 2017-09-06 DIAGNOSIS — D631 Anemia in chronic kidney disease: Secondary | ICD-10-CM | POA: Diagnosis not present

## 2017-09-06 DIAGNOSIS — N186 End stage renal disease: Secondary | ICD-10-CM | POA: Diagnosis not present

## 2017-09-06 DIAGNOSIS — L299 Pruritus, unspecified: Secondary | ICD-10-CM | POA: Diagnosis not present

## 2017-09-06 DIAGNOSIS — I4891 Unspecified atrial fibrillation: Secondary | ICD-10-CM | POA: Diagnosis not present

## 2017-09-06 DIAGNOSIS — D509 Iron deficiency anemia, unspecified: Secondary | ICD-10-CM | POA: Diagnosis not present

## 2017-09-07 DIAGNOSIS — N186 End stage renal disease: Secondary | ICD-10-CM | POA: Diagnosis not present

## 2017-09-07 DIAGNOSIS — N2581 Secondary hyperparathyroidism of renal origin: Secondary | ICD-10-CM | POA: Diagnosis not present

## 2017-09-07 DIAGNOSIS — D631 Anemia in chronic kidney disease: Secondary | ICD-10-CM | POA: Diagnosis not present

## 2017-09-07 DIAGNOSIS — D509 Iron deficiency anemia, unspecified: Secondary | ICD-10-CM | POA: Diagnosis not present

## 2017-09-07 DIAGNOSIS — R51 Headache: Secondary | ICD-10-CM | POA: Diagnosis not present

## 2017-09-07 DIAGNOSIS — D689 Coagulation defect, unspecified: Secondary | ICD-10-CM | POA: Diagnosis not present

## 2017-09-07 DIAGNOSIS — L299 Pruritus, unspecified: Secondary | ICD-10-CM | POA: Diagnosis not present

## 2017-09-07 DIAGNOSIS — I4891 Unspecified atrial fibrillation: Secondary | ICD-10-CM | POA: Diagnosis not present

## 2017-09-08 ENCOUNTER — Encounter: Payer: Self-pay | Admitting: Internal Medicine

## 2017-09-08 ENCOUNTER — Ambulatory Visit (INDEPENDENT_AMBULATORY_CARE_PROVIDER_SITE_OTHER): Payer: Self-pay | Admitting: Vascular Surgery

## 2017-09-08 ENCOUNTER — Ambulatory Visit (HOSPITAL_COMMUNITY)
Admission: RE | Admit: 2017-09-08 | Discharge: 2017-09-08 | Disposition: A | Discharge status: Home / Self Care | Payer: Medicare Other | Source: Ambulatory Visit | Attending: Vascular Surgery | Admitting: Vascular Surgery

## 2017-09-08 ENCOUNTER — Other Ambulatory Visit: Payer: Self-pay

## 2017-09-08 ENCOUNTER — Ambulatory Visit (INDEPENDENT_AMBULATORY_CARE_PROVIDER_SITE_OTHER): Payer: Medicare Other | Admitting: Internal Medicine

## 2017-09-08 ENCOUNTER — Encounter: Payer: Self-pay | Admitting: Vascular Surgery

## 2017-09-08 VITALS — BP 146/82 | HR 111 | Ht 69.0 in | Wt 158.4 lb

## 2017-09-08 VITALS — BP 146/82 | HR 63 | Temp 99.5°F | Resp 18 | Ht 69.0 in | Wt 159.0 lb

## 2017-09-08 DIAGNOSIS — Z48812 Encounter for surgical aftercare following surgery on the circulatory system: Secondary | ICD-10-CM

## 2017-09-08 DIAGNOSIS — J449 Chronic obstructive pulmonary disease, unspecified: Secondary | ICD-10-CM | POA: Diagnosis not present

## 2017-09-08 DIAGNOSIS — N186 End stage renal disease: Secondary | ICD-10-CM

## 2017-09-08 DIAGNOSIS — R0609 Other forms of dyspnea: Secondary | ICD-10-CM | POA: Diagnosis not present

## 2017-09-08 DIAGNOSIS — F17210 Nicotine dependence, cigarettes, uncomplicated: Secondary | ICD-10-CM

## 2017-09-08 DIAGNOSIS — Z992 Dependence on renal dialysis: Secondary | ICD-10-CM

## 2017-09-08 DIAGNOSIS — R0600 Dyspnea, unspecified: Secondary | ICD-10-CM

## 2017-09-08 MED ORDER — FAMOTIDINE 20 MG PO TABS: ORAL_TABLET | ORAL | 2 refills | Status: DC

## 2017-09-08 MED ORDER — TIOTROPIUM BROMIDE MONOHYDRATE 2.5 MCG/ACT IN AERS: 2.0000 | INHALATION_SPRAY | Freq: Every day | RESPIRATORY_TRACT | 1 refills | Status: DC

## 2017-09-08 MED ORDER — PANTOPRAZOLE SODIUM 40 MG PO TBEC: 40.0000 mg | DELAYED_RELEASE_TABLET | Freq: Every day | ORAL | 2 refills | Status: DC

## 2017-09-08 MED ORDER — BUDESONIDE-FORMOTEROL FUMARATE 80-4.5 MCG/ACT IN AERO: 2.0000 | INHALATION_SPRAY | Freq: Two times a day (BID) | RESPIRATORY_TRACT | 1 refills | Status: DC

## 2017-09-08 NOTE — Progress Notes (Signed)
POST OPERATIVE OFFICE NOTE    CC:  F/u for surgery  HPI:  This is a 57 y.o. male who is s/p Right brachial cephalic AV fistula creation.  Prior to this fistula creation  he underwent left upper extremity split thickness skin graft to cover an open wound from a previous hematoma evacuation after infiltration of an AV fistula.  He currently is dialyzing via right-sided catheter.    He denise right hand weakness, loss of sensation or pain.    Allergies  Allergen Reactions  . Aspirin Other (See Comments)    STOMACH PAIN  . Clonidine Derivatives Itching  . Tramadol Itching    Current Outpatient Medications  Medication Sig Dispense Refill  . albuterol (VENTOLIN HFA) 108 (90 Base) MCG/ACT inhaler Inhale 2 puffs into the lungs every 6 (six) hours as needed for wheezing or shortness of breath.    . budesonide-formoterol (SYMBICORT) 80-4.5 MCG/ACT inhaler Inhale 2 puffs into the lungs 2 (two) times daily. 1 Inhaler 1  . clopidogrel (PLAVIX) 75 MG tablet Take 1 tablet (75 mg total) by mouth daily. 30 tablet 3  . diltiazem (CARDIZEM CD) 120 MG 24 hr capsule Take 1 capsule (120 mg total) by mouth daily. 30 capsule 1  . famotidine (PEPCID) 20 MG tablet One at bedtime 30 tablet 2  . hydrALAZINE (APRESOLINE) 100 MG tablet Take 1 tablet by mouth twice a day take after HD on dialysis days  11  . isosorbide mononitrate (IMDUR) 30 MG 24 hr tablet Take 1 tablet (30 mg total) by mouth daily. 30 tablet 1  . minoxidil (LONITEN) 10 MG tablet Take 10 mg by mouth daily.    . pantoprazole (PROTONIX) 40 MG tablet Take 1 tablet (40 mg total) by mouth daily. Take 30-60 min before first meal of the day 30 tablet 2  . Tiotropium Bromide Monohydrate (SPIRIVA RESPIMAT) 2.5 MCG/ACT AERS Inhale 2 puffs into the lungs daily. 1 Inhaler 1  . azithromycin (ZITHROMAX) 250 MG tablet See admin instructions.  0   No current facility-administered medications for this visit.      ROS:  See HPI  Physical Exam:  Vitals:   09/08/17 1524 09/08/17 1525  BP: (!) 157/81 (!) 146/82  Pulse: 63 63  Resp: 18   Temp: 99.5 F (37.5 C)   SpO2: 96%     Incision:  Well healed Extremities:  Grip- 5/5 B UE, sensation intact and equal B, palpable radial pulse B Palpable thrill in the right AV fistula  Fistula duplex  Size < 0.4, depth < 0.2 cm  Assessment/Plan:  This is a 57 y.o. male who is s/p:right Brachial cephalic av fistula creation.  The fistula is maturing with notable branches.  He is 1 month from surgery.  We will give the fistula 2 more months to mature.  We ask him to use the right UE for exercise to help it mature better.    He will f/u in 2 months at that time we will repeat a fistula duplex.  He may need another procedure to ligate branches pending the growth in 2 months of the fistula.   Roxy Horseman PA-C [facility] [phone]  Clinic MD:  Ithiel Liebler  History and exam findings as above.  Skin graft on the left arm is well healed.  Right brachiocephalic AV fistula is patent but still fairly small at this point.  He will return in 2 months time for follow-up exam.  If the fistula still small in diameter  we will consider sidebranch ligation at that point.  Ruta Hinds, MD [facility] Office: [phone] Pager: [phone]

## 2017-09-08 NOTE — Progress Notes (Signed)
Vitals:   09/08/17 1524  BP: (!) 157/81  Pulse: 63  Resp: 18  Temp: 99.5 F (37.5 C)  TempSrc: Oral  Weight: 159 lb (72.1 kg)  Height: 5\' 9"  (1.753 m)

## 2017-09-08 NOTE — Progress Notes (Signed)
Subjective:     Patient ID: [name], male   DOB: 1961-05-29,    MRN: [id number]    Brief patient profile:  74 yobm active smoker on HD x 2011 MWF referred to pulmonary clinic 09/27/2016 by Dr   Vista Lawman for sob     History of Present Illness  09/27/2016 1st [facility] office visit/ [facility]   Chief Complaint  Patient presents with  . Pulmonary Consult    Referred by Dr. Vista Lawman for eval of COPD. Pt states that he has been to the ED x 3 for SOB that occurs at night when he lies down. He states this started back in 2017.    HD am of ov and does feel better in terms of breathing p HD but trips do er do not correlate with HD before vs after. In addition to having paroxyms of resting sob also has doe x MMRC2 = can't walk a nl pace on a flat grade s sob but does fine slow and flat rec Stop advair - if breathing no better next step is see Dr Vista Lawman for trial off lisinopril Plan A = Automatic = symbicort 160 Take 2 puffs first thing in am and then another 2 puffs about 12 hours later.  Work on inhaler technique:  relax and gently blow all the way out then take a nice smooth deep breath back in, triggering the inhaler at same time you start breathing in.  Hold for up to 5 seconds if you can. Blow out thru nose. Rinse and gargle with water when done Plan B = Backup Only use your albuterol (PROVENTIL/yellow)   See phone messages 12/30/16,  Advised to come in for re-eval with all meds in hand   01/04/2017  f/u ov/[facility] re: sob/ no meds Chief Complaint  Patient presents with  . Follow-up    OV for oxygen, feels more SOB lately since the heat, wants to stop smoking  angry on arrival / uncooperative with interview and exam related to meds  rec  left prior to completing eval/ agree re confusion with meds      03/10/17 [facility] eval rec  I'm sending in a prescription for an albuterol inhaler (Ventolin) to use as needed when you're short of breath, coughing, or  wheezing.  Remember to use your Symbicort inhaler by doing 2 puffs twice daily - every day.  Use the spacer with your Symbicort inhaler.   Remember to remove any dentures or partials you have before you use your Symbicort. Remember to brush your teeth & tongue after you use your inhaler as well as rinse, gargle & spit to keep from getting thrush in your mouth or on your tongue (a white film).   Try using nicotine gum or lozenges for your intermittent cigarette cravings.   Contact me if you have any new breathing problems or questions before your next appointment.  TESTS ORDERED: 1. Full pulmonary function testing and follow-up appointment> did not do 2. 6 minute walk test on room air before next appointment> did not f/u 3. Serum alpha-1 antitrypsin phenotype today> MM   09/08/2017  Extended  ov/[facility] re:  Copd  Active smoker   Chief Complaint  Patient presents with  . Acute Visit    breathing progressively worse since last visit. He states he stays SOB "most of the time". He gets winded walking from room to room at home. He is coughing up sputum of unknown color.   Dyspnea:  Room to  room Cough: rattling in am but min mucoid sputum Sleep: on back flat  SABA use:  4 x daily on advair also  No obvious day to day or daytime variability or assoc excess/ purulent sputum or mucus plugs or hemoptysis or cp or chest tightness, subjective wheeze or overt sinus or hb symptoms. No unusual exposure hx or h/o childhood pna/ asthma or knowledge of premature birth.   Also denies any obvious fluctuation of symptoms with weather or environmental changes or other aggravating or alleviating factors except as outlined above   Current Allergies, Complete Past Medical History, Past Surgical History, Family History, and Social History were reviewed in [employer] record.  ROS  The following are not active complaints unless bolded Hoarseness, sore throat, dysphagia, dental problems,  itching, sneezing,  nasal congestion or discharge of excess mucus or purulent secretions, ear ache,   fever, chills, sweats, unintended wt loss or wt gain, classically pleuritic or exertional cp,  orthopnea pnd or leg swelling, presyncope, palpitations, abdominal pain, anorexia, nausea, vomiting, diarrhea  or change in bowel habits or change in bladder habits, change in stools or change in urine, dysuria, hematuria,  rash, arthralgias, visual complaints, headache, numbness, weakness or ataxia or problems with walking or coordination,  change in mood/affect or memory.        Current Meds  Medication Sig  . albuterol (VENTOLIN HFA) 108 (90 Base) MCG/ACT inhaler Inhale 2 puffs into the lungs every 6 (six) hours as needed for wheezing or shortness of breath.  . clopidogrel (PLAVIX) 75 MG tablet Take 1 tablet (75 mg total) by mouth daily.  Marland Kitchen diltiazem (CARDIZEM CD) 120 MG 24 hr capsule Take 1 capsule (120 mg total) by mouth daily.  . isosorbide mononitrate (IMDUR) 30 MG 24 hr tablet Take 1 tablet (30 mg total) by mouth daily.  . minoxidil (LONITEN) 10 MG tablet Take 10 mg by mouth daily.  .   Fluticasone-Salmeterol (ADVAIR) 250-50 MCG/DOSE AEPB Inhale 1 puff into the lungs 2 (two) times daily.               Objective:   Physical Exam    hoarse amb am nad/ very unusual affect, very easily confused with details of care   Wt Readings from Last 3 Encounters:  09/08/17 158 lb 6.4 oz (71.8 kg)  09/02/17 163 lb 1.6 oz (74 kg)  08/30/17 156 lb 3.2 oz (70.9 kg)     Vital signs reviewed - Note on arrival 02 sats  100% on RA      HEENT: nl dentition, turbinates bilaterally, and oropharynx. Nl external ear canals without cough reflex   NECK :  without JVD/Nodes/TM/ nl carotid upstrokes bilaterally   LUNGS: no acc muscle use,  slt barrel chest/ distant bs with mostly upper airway wheezing    CV:  RRR  no s3 or II-III/VI sem with ?  ncrease in P2, and no edema   ABD:  Mod distended soft and  nontender with nl inspiratory excursion in the supine position. No bruits or organomegaly appreciated, bowel sounds nl  MS:  Nl gait/ ext warm without deformities, calf tenderness, cyanosis or clubbing No obvious joint restrictions   SKIN: warm and dry without lesions    NEURO:  alert, approp, nl sensorium with  no motor or cerebellar deficits apparent.      I personally reviewed images and agree with radiology impression as follows:  CXR:   09/02/17  Cardiomegaly. Superimposed pericardial effusion cannot be excluded.  No edema or consolidation. Pulmonary vascularity normal. Aortic atherosclerosis.     I personally reviewed images and agree with radiology impression as follows:   abdominal  CT  09/02/17  1. Large volume ascites, similar to prior. 2. New trace bilateral pleural effusions and bibasilar subsegmental atelectasis. 3. Small pericardial effusion.    Assessment:

## 2017-09-08 NOTE — Patient Instructions (Addendum)
Plan A = Automatic =  Add spiriva 2 pffs each am Pantoprazole (protonix) 40 mg   Take  30-60 min before first meal of the day and Pepcid (famotidine)  20 mg one @  bedtime until return to office       Plan B = Backup Only use your albuterol as a rescue medication to be used if you can't catch your breath by resting or doing a relaxed purse lip breathing pattern.  - The less you use it, the better it will work when you need it. - Ok to use the inhaler up to 2 puffs  every 4 hours if you must but call for appointment if use goes up over your usual need - Don't leave home without it !!  (think of it like the spare tire for your car)    Please schedule a follow up office visit in 4 weeks, call sooner if needed with pfts

## 2017-09-09 DIAGNOSIS — D631 Anemia in chronic kidney disease: Secondary | ICD-10-CM | POA: Diagnosis not present

## 2017-09-09 DIAGNOSIS — I120 Hypertensive chronic kidney disease with stage 5 chronic kidney disease or end stage renal disease: Secondary | ICD-10-CM | POA: Diagnosis not present

## 2017-09-09 DIAGNOSIS — N186 End stage renal disease: Secondary | ICD-10-CM | POA: Diagnosis not present

## 2017-09-09 DIAGNOSIS — D509 Iron deficiency anemia, unspecified: Secondary | ICD-10-CM | POA: Diagnosis not present

## 2017-09-09 DIAGNOSIS — Z992 Dependence on renal dialysis: Secondary | ICD-10-CM | POA: Diagnosis not present

## 2017-09-09 DIAGNOSIS — N2581 Secondary hyperparathyroidism of renal origin: Secondary | ICD-10-CM | POA: Diagnosis not present

## 2017-09-11 ENCOUNTER — Encounter: Payer: Self-pay | Admitting: Internal Medicine

## 2017-09-11 NOTE — Assessment & Plan Note (Addendum)
Echo 08/30/17 Left ventricle: The cavity size was normal. There was severe   concentric hypertrophy. Systolic function was normal. The   estimated ejection fraction was in the range of 55% to 60%.   Hypokinesis of the anteroseptal and inferoseptal myocardium. - Ventricular septum: The contour showed diastolic flattening and   systolic flattening consistent with right ventricular pressure   and volume overload. - Aortic valve: Transvalvular velocity was within the normal range.   There was no stenosis. There was no regurgitation. - Mitral valve: Calcified annulus. Moderately thickened leaflets .   Mobility was restricted. The findings are consistent with severe   stenosis. There was mild regurgitation. Mean gradient (D): 16 mm   Hg. Valve area by pressure half-time: 2.01 cm^2. - Left atrium: The atrium was severely dilated. - Right ventricle: The cavity size was severely dilated. Wall   thickness was normal. Systolic function was normal. - Right atrium: The atrium was severely dilated. - Atrial septum: No defect or patent foramen ovale was identified   by color flow Doppler. - Tricuspid valve: There was moderate regurgitation. - Pulmonary arteries: PA peak pressure: 91 mm Hg (S). - Pericardium, extracardiac: A moderate pericardial effusion was   identified along the left ventricular free wall measuring up to   1.64 cm. Unchanged compared with the echo 05/2017. There was no   chamber collapse. There was evidence for increased RV-LV   interaction demonstrated by respirophasic changes in transmitral   velocities and tricuspid velocities. The possibility of   hemodynamic compromise cannot be excluded on the basis of   available images. - Impressions: Severe mitral stenosis by mean gradient. However,   calculated valve area indicates mild stenosis. The leaflets are   restricted but there does not appear to be severe stenosis.   Suspect he has significant chf clinically > needs cards eval  and likely RHC

## 2017-09-11 NOTE — Assessment & Plan Note (Addendum)
Alpha one AT  04/10/17  MM  Level 200  Spirometry 09/27/2016  FEV1 2.20 (70%)  Ratio 64 p am advair with typical curvature   - 09/08/2017  After extensive coaching inhaler device  effectiveness =    75% try add spiriva 2pffs each am to symbicort 80    DDX of  difficult airways management almost all start with A and  include Adherence, Ace Inhibitors, Acid Reflux, Active Sinus Disease, Alpha 1 Antitripsin deficiency, Anxiety masquerading as Airways dz,  ABPA,  Allergy(esp in young), Aspiration (esp in elderly), Adverse effects of meds,  Active smokers, A bunch of PE's (a small clot burden can't cause this syndrome unless there is already severe underlying pulm or vascular dz with poor reserve) plus two Bs  = Bronchiectasis and Beta blocker use..and one C= CHF  Most likely suspects: Adherence is always the initial "prime suspect" and is a multilayered concern that requires a "trust but verify" approach in every patient - starting with knowing how to use medications, especially inhalers, correctly, keeping up with refills and understanding the fundamental difference between maintenance and prns vs those medications only taken for a very short course and then stopped and not refilled.  - see hfa training - return with all meds in hand using a trust but verify approach to confirm accurate Medication  Reconciliation The principal here is that until we are certain that the  patients are doing what we've asked, it makes no sense to ask them to do more.    Active smoking > discussed sep  ? Acid (or non-acid) GERD > always difficult to exclude as up to 75% of pts in some series report no assoc GI/ Heartburn symptoms> rec max (24h)  acid suppression    ? Adverse effects of meds >  advair dpi a problem here given cough /hoarseness >> try hfa /smi instead   ? Anxiety/depression/ ? Underlying personality disorder  > usually at the bottom of this list of usual suspects but should be much higher on this pt's based  on H and P and behavior in this clinic to date > may interfere with adherence/smoking cessation/ keeping up with appts/ advised he needs to meet [place] half way or we won't be able to see him here.  CHF > see dyspnea,  Has severe LAE so probably this is all left sided ? Due to valvular ht dz?    I had an extended discussion with the patient reviewing all relevant studies completed to date and  lasting 25 minutes of a 40  minute office  visit to re-establish     re  severe non-specific but potentially very serious refractory respiratory symptoms of uncertain and potentially multiple  etiologies.   Each maintenance medication was reviewed in detail including most importantly the difference between maintenance and prns and under what circumstances the prns are to be triggered using an action plan format that is not reflected in the computer generated alphabetically organized AVS.    Please see AVS for specific instructions unique to this office visit that I personally wrote and verbalized to the the pt in detail and then reviewed with pt  by my nurse highlighting any changes in therapy/plan of care  recommended at today's visit.

## 2017-09-11 NOTE — Assessment & Plan Note (Signed)

## 2017-09-12 DIAGNOSIS — D509 Iron deficiency anemia, unspecified: Secondary | ICD-10-CM | POA: Diagnosis not present

## 2017-09-12 DIAGNOSIS — N2581 Secondary hyperparathyroidism of renal origin: Secondary | ICD-10-CM | POA: Diagnosis not present

## 2017-09-12 DIAGNOSIS — N186 End stage renal disease: Secondary | ICD-10-CM | POA: Diagnosis not present

## 2017-09-12 DIAGNOSIS — D631 Anemia in chronic kidney disease: Secondary | ICD-10-CM | POA: Diagnosis not present

## 2017-09-13 NOTE — Addendum Note (Signed)
Addended by: Lianne Cure A on: 09/13/2017 10:19 AM   Modules accepted: Orders

## 2017-09-14 DIAGNOSIS — D631 Anemia in chronic kidney disease: Secondary | ICD-10-CM | POA: Diagnosis not present

## 2017-09-14 DIAGNOSIS — D509 Iron deficiency anemia, unspecified: Secondary | ICD-10-CM | POA: Diagnosis not present

## 2017-09-14 DIAGNOSIS — N186 End stage renal disease: Secondary | ICD-10-CM | POA: Diagnosis not present

## 2017-09-14 DIAGNOSIS — N2581 Secondary hyperparathyroidism of renal origin: Secondary | ICD-10-CM | POA: Diagnosis not present

## 2017-09-16 DIAGNOSIS — D631 Anemia in chronic kidney disease: Secondary | ICD-10-CM | POA: Diagnosis not present

## 2017-09-16 DIAGNOSIS — D509 Iron deficiency anemia, unspecified: Secondary | ICD-10-CM | POA: Diagnosis not present

## 2017-09-16 DIAGNOSIS — N186 End stage renal disease: Secondary | ICD-10-CM | POA: Diagnosis not present

## 2017-09-16 DIAGNOSIS — N2581 Secondary hyperparathyroidism of renal origin: Secondary | ICD-10-CM | POA: Diagnosis not present

## 2017-09-19 DIAGNOSIS — N2581 Secondary hyperparathyroidism of renal origin: Secondary | ICD-10-CM | POA: Diagnosis not present

## 2017-09-19 DIAGNOSIS — N186 End stage renal disease: Secondary | ICD-10-CM | POA: Diagnosis not present

## 2017-09-19 DIAGNOSIS — D631 Anemia in chronic kidney disease: Secondary | ICD-10-CM | POA: Diagnosis not present

## 2017-09-19 DIAGNOSIS — D509 Iron deficiency anemia, unspecified: Secondary | ICD-10-CM | POA: Diagnosis not present

## 2017-09-21 DIAGNOSIS — D509 Iron deficiency anemia, unspecified: Secondary | ICD-10-CM | POA: Diagnosis not present

## 2017-09-21 DIAGNOSIS — D631 Anemia in chronic kidney disease: Secondary | ICD-10-CM | POA: Diagnosis not present

## 2017-09-21 DIAGNOSIS — N186 End stage renal disease: Secondary | ICD-10-CM | POA: Diagnosis not present

## 2017-09-21 DIAGNOSIS — N2581 Secondary hyperparathyroidism of renal origin: Secondary | ICD-10-CM | POA: Diagnosis not present

## 2017-09-21 DIAGNOSIS — R0682 Tachypnea, not elsewhere classified: Secondary | ICD-10-CM | POA: Diagnosis not present

## 2017-09-22 DIAGNOSIS — M79602 Pain in left arm: Secondary | ICD-10-CM | POA: Diagnosis not present

## 2017-09-22 DIAGNOSIS — G8929 Other chronic pain: Secondary | ICD-10-CM | POA: Diagnosis not present

## 2017-09-23 ENCOUNTER — Encounter: Payer: Self-pay | Admitting: *Deleted

## 2017-09-26 ENCOUNTER — Encounter (HOSPITAL_COMMUNITY): Payer: Self-pay

## 2017-09-26 ENCOUNTER — Emergency Department (HOSPITAL_COMMUNITY): Payer: Medicare Other

## 2017-09-26 ENCOUNTER — Emergency Department (HOSPITAL_COMMUNITY)
Admission: EM | Admit: 2017-09-26 | Discharge: 2017-09-26 | Disposition: A | Discharge status: Home / Self Care | Payer: Medicare Other | Attending: Emergency Medicine | Admitting: Emergency Medicine

## 2017-09-26 DIAGNOSIS — R0602 Shortness of breath: Secondary | ICD-10-CM | POA: Diagnosis not present

## 2017-09-26 DIAGNOSIS — R188 Other ascites: Secondary | ICD-10-CM | POA: Insufficient documentation

## 2017-09-26 DIAGNOSIS — D509 Iron deficiency anemia, unspecified: Secondary | ICD-10-CM | POA: Diagnosis not present

## 2017-09-26 DIAGNOSIS — N2581 Secondary hyperparathyroidism of renal origin: Secondary | ICD-10-CM | POA: Diagnosis not present

## 2017-09-26 DIAGNOSIS — Z79899 Other long term (current) drug therapy: Secondary | ICD-10-CM | POA: Insufficient documentation

## 2017-09-26 DIAGNOSIS — I252 Old myocardial infarction: Secondary | ICD-10-CM | POA: Insufficient documentation

## 2017-09-26 DIAGNOSIS — I2510 Atherosclerotic heart disease of native coronary artery without angina pectoris: Secondary | ICD-10-CM | POA: Insufficient documentation

## 2017-09-26 DIAGNOSIS — I120 Hypertensive chronic kidney disease with stage 5 chronic kidney disease or end stage renal disease: Secondary | ICD-10-CM | POA: Insufficient documentation

## 2017-09-26 DIAGNOSIS — N186 End stage renal disease: Secondary | ICD-10-CM | POA: Insufficient documentation

## 2017-09-26 DIAGNOSIS — Z992 Dependence on renal dialysis: Secondary | ICD-10-CM | POA: Insufficient documentation

## 2017-09-26 DIAGNOSIS — R000 Tachycardia, unspecified: Secondary | ICD-10-CM | POA: Diagnosis not present

## 2017-09-26 DIAGNOSIS — J449 Chronic obstructive pulmonary disease, unspecified: Secondary | ICD-10-CM | POA: Insufficient documentation

## 2017-09-26 DIAGNOSIS — D631 Anemia in chronic kidney disease: Secondary | ICD-10-CM | POA: Diagnosis not present

## 2017-09-26 DIAGNOSIS — F17210 Nicotine dependence, cigarettes, uncomplicated: Secondary | ICD-10-CM | POA: Insufficient documentation

## 2017-09-26 LAB — CBC WITH DIFFERENTIAL/PLATELET
Basophils Absolute: 0 10*3/uL (ref 0.0–0.1)
Basophils Relative: 0 %
Eosinophils Absolute: 0.1 10*3/uL (ref 0.0–0.7)
Eosinophils Relative: 2 %
HCT: 32.9 % — ABNORMAL LOW (ref 39.0–52.0)
Hemoglobin: 11 g/dL — ABNORMAL LOW (ref 13.0–17.0)
Lymphocytes Relative: 21 %
Lymphs Abs: 1.6 10*3/uL (ref 0.7–4.0)
MCH: 26.9 pg (ref 26.0–34.0)
MCHC: 33.4 g/dL (ref 30.0–36.0)
MCV: 80.4 fL (ref 78.0–100.0)
Monocytes Absolute: 0.5 10*3/uL (ref 0.1–1.0)
Monocytes Relative: 7 %
Neutro Abs: 5.2 10*3/uL (ref 1.7–7.7)
Neutrophils Relative %: 70 %
Platelets: 124 10*3/uL — ABNORMAL LOW (ref 150–400)
RBC: 4.09 MIL/uL — ABNORMAL LOW (ref 4.22–5.81)
RDW: 18.2 % — ABNORMAL HIGH (ref 11.5–15.5)
WBC: 7.4 10*3/uL (ref 4.0–10.5)

## 2017-09-26 LAB — I-STAT CHEM 8, ED
BUN: 26 mg/dL — ABNORMAL HIGH (ref 6–20)
Calcium, Ion: 1.03 mmol/L — ABNORMAL LOW (ref 1.15–1.40)
Chloride: 94 mmol/L — ABNORMAL LOW (ref 101–111)
Creatinine, Ser: 7.8 mg/dL — ABNORMAL HIGH (ref 0.61–1.24)
Glucose, Bld: 103 mg/dL — ABNORMAL HIGH (ref 65–99)
HCT: 35 % — ABNORMAL LOW (ref 39.0–52.0)
Hemoglobin: 11.9 g/dL — ABNORMAL LOW (ref 13.0–17.0)
Potassium: 4.5 mmol/L (ref 3.5–5.1)
Sodium: 134 mmol/L — ABNORMAL LOW (ref 135–145)
TCO2: 33 mmol/L — ABNORMAL HIGH (ref 22–32)

## 2017-09-26 NOTE — ED Provider Notes (Signed)
[facility] EMERGENCY DEPARTMENT Provider Note   CSN: [id number] Arrival date & time: 09/26/17  9892     History   Chief Complaint Chief Complaint  Patient presents with  . Shortness of Breath    HPI [name] is a 57 y.o. male.  He presents for evaluation of abdominal swelling with discomfort, making it hard to breathe.  He is worried that his ascites is coming back.  He was recently hospitalized with similar complaints at that time he had cough.  He was evaluated for abdominal swelling and found to have ascites, it was drained in the fluid did not indicate infection or other complication.  He has known hepatitis, but does not regularly see a gastroenterologist.  He has mild shortness of breath with cough, similar to his problems when he has COPD.  He had his usual dialysis treatment today.  No recent fever, chills, nausea, vomiting, weakness or dizziness.  There are no other known modifying factors.  HPI  Past Medical History:  Diagnosis Date  . Adenomatous colon polyp    tubular  . Anemia   . Anxiety   . Arthritis    left shoulder  . Atherosclerosis of aorta ([place])   . Cardiomegaly   . Chest pain    DATE UNKNOWN, C/O PERIODICALLY  . Cocaine abuse ([place])   . COPD exacerbation ([place]) 08/17/2016  . Coronary artery disease    stent 02/22/17  . Dialysis patient ([place])    Monday-Wednesday-Friday  . ESRD (end stage renal disease) on dialysis ([place])    "[name]; MWF" (07/04/2017)  . GERD (gastroesophageal reflux disease)    DATE UNKNOWN  . Hemorrhoids   . Hepatitis B, chronic ([place])   . Hepatitis C   . History of kidney stones   . Hyperkalemia   . Hypertension   . Metabolic bone disease    Patient denies  . Myocardial infarction ([place])   . Pneumonia   . Pulmonary edema   . Renal disorder   . Renal insufficiency   . Shortness of breath dyspnea    " for the last past year with this dialysis"  . Solitary rectal ulcer syndrome   . Tubular  adenoma of colon     Patient Active Problem List   Diagnosis Date Noted  . Ileus ([place]) 09/29/2017  . QT prolongation 09/29/2017  . Sinus congestion 09/03/2017  . Symptomatic anemia 09/02/2017  . Other cirrhosis of liver ([place]) 09/02/2017  . Left bundle branch block 09/02/2017  . Mitral stenosis 09/02/2017  . Hematochezia 07/15/2017  . Wide-complex tachycardia ([place])   . Endotracheally intubated   . ESRD (end stage renal disease) ([place]) 07/04/2017  . Acute respiratory failure with hypoxia ([place]) 06/18/2017  . CKD (chronic kidney disease) stage V requiring chronic dialysis ([place]) 06/18/2017  . History of Cocaine abuse ([place]) 06/18/2017  . Hypertension 06/18/2017  . Infection of AV graft for dialysis ([place]) 06/18/2017  . Anxiety 06/18/2017  . Anemia due to chronic kidney disease 06/18/2017  . Atrial flutter with rapid ventricular response ([place]) 06/18/2017  . Personality disorder ([place]) 06/13/2017  . Cellulitis 06/12/2017  . Adjustment disorder with mixed anxiety and depressed mood 06/10/2017  . Suicidal ideation 06/10/2017  . Arm wound, left, sequela 06/10/2017  . Dyspnea on exertion 05/29/2017  . Tachycardia 05/29/2017  . Hyperkalemia 05/22/2017  . Acute metabolic encephalopathy   . Anemia 04/23/2017  . Ascites 04/23/2017  . COPD (chronic obstructive pulmonary disease) ([place]) 04/23/2017  . Acute  on chronic respiratory failure with hypoxia ([place]) 03/25/2017  . Arrhythmia 03/25/2017  . COPD GOLD II/ still smoking 09/27/2016  . Essential hypertension 09/27/2016  . Fluid overload 08/30/2016  . COPD exacerbation ([place]) 08/17/2016  . Hypertensive urgency 08/17/2016  . Respiratory failure ([place]) 08/17/2016  . Problem with dialysis access ([place]) 07/23/2016  . End stage renal disease ([place]) 12/02/2015  . Chronic hepatitis B ([place]) 03/05/2014  . Chronic hepatitis C without hepatic coma ([place]) 03/05/2014  . Internal hemorrhoids with bleeding, swelling and itching 03/05/2014  . Thrombocytopenia  ([place]) 03/05/2014  . Chest pain 02/27/2014  . Alcohol abuse 04/14/2009  . Cigarette smoker 04/14/2009  . GANGLION CYST 04/14/2009    Past Surgical History:  Procedure Laterality Date  . A/V FISTULAGRAM Left 05/26/2017   Procedure: A/V FISTULAGRAM;  Surgeon: Conrad Normandy, MD;  Location: [facility];  Service: Cardiovascular;  Laterality: Left;  . APPLICATION OF WOUND VAC Left 06/14/2017   Procedure: APPLICATION OF WOUND VAC;  Surgeon: Katha Cabal, MD;  Location: [facility];  Service: Vascular;  Laterality: Left;  . AV FISTULA PLACEMENT  2012   BELIEVED WAS PLACED IN JUNE  . AV FISTULA PLACEMENT Right 08/09/2017   Procedure: Creation Right arm ARTERIOVENOUS BRACHIOCEPOHALIC FISTULA;  Surgeon: Elam Dutch, MD;  Location: [facility];  Service: Vascular;  Laterality: Right;  . COLONOSCOPY    . CORONARY STENT INTERVENTION N/A 02/22/2017   Procedure: CORONARY STENT INTERVENTION;  Surgeon: Nigel Mormon, MD;  Location: [facility];  Service: Cardiovascular;  Laterality: N/A;  . FLEXIBLE SIGMOIDOSCOPY N/A 07/15/2017   Procedure: FLEXIBLE SIGMOIDOSCOPY;  Surgeon: Carol Ada, MD;  Location: [facility];  Service: Endoscopy;  Laterality: N/A;  . HEMORRHOID BANDING    . I&D EXTREMITY Left 06/01/2017   Procedure: IRRIGATION AND DEBRIDEMENT LEFT ARM HEMATOMA WITH LIGATION OF LEFT ARM AV FISTULA;  Surgeon: Elam Dutch, MD;  Location: [facility];  Service: Vascular;  Laterality: Left;  . I&D EXTREMITY Left 06/14/2017   Procedure: IRRIGATION AND DEBRIDEMENT EXTREMITY;  Surgeon: Katha Cabal, MD;  Location: [facility];  Service: Vascular;  Laterality: Left;  . INSERTION OF DIALYSIS CATHETER  05/30/2017  . INSERTION OF DIALYSIS CATHETER N/A 05/30/2017   Procedure: INSERTION OF DIALYSIS CATHETER;  Surgeon: Elam Dutch, MD;  Location: [facility];  Service: Vascular;  Laterality: N/A;  . IR PARACENTESIS  08/30/2017  . LEFT HEART CATH AND CORONARY ANGIOGRAPHY N/A 02/22/2017    Procedure: LEFT HEART CATH AND CORONARY ANGIOGRAPHY;  Surgeon: Nigel Mormon, MD;  Location: [facility];  Service: Cardiovascular;  Laterality: N/A;  . LIGATION OF ARTERIOVENOUS  FISTULA Left 12/15/4401   Procedure: Plication of Left Arm Arteriovenous Fistula;  Surgeon: Elam Dutch, MD;  Location: [facility];  Service: Vascular;  Laterality: Left;  . POLYPECTOMY    . REVISON OF ARTERIOVENOUS FISTULA Left 4/74/2595   Procedure: PLICATION OF DISTAL ANEURYSMAL SEGEMENT OF LEFT UPPER ARM ARTERIOVENOUS FISTULA;  Surgeon: Elam Dutch, MD;  Location: [facility];  Service: Vascular;  Laterality: Left;  . REVISON OF ARTERIOVENOUS FISTULA Left 6/38/7564   Procedure: Plication of Left Upper Arm Fistula ;  Surgeon: Waynetta Sandy, MD;  Location: [facility];  Service: Vascular;  Laterality: Left;  . SKIN GRAFT SPLIT THICKNESS LEG / FOOT Left    SKIN GRAFT SPLIT THICKNESS LEFT ARM DONOR SITE: LEFT ANTERIOR THIGH  . SKIN SPLIT GRAFT Left 07/04/2017   Procedure: SKIN GRAFT SPLIT THICKNESS LEFT  ARM DONOR SITE: LEFT ANTERIOR THIGH;  Surgeon: Elam Dutch, MD;  Location: [facility];  Service: Vascular;  Laterality: Left;  . THROMBECTOMY W/ EMBOLECTOMY Left 06/05/2017   Procedure: EXPLORATION OF LEFT ARM FOR BLEEDING; OVERSEWED PROXIMAL FISTULA;  Surgeon: Angelia Mould, MD;  Location: [facility];  Service: Vascular;  Laterality: Left;  . WOUND EXPLORATION Left 06/03/2017   Procedure: WOUND EXPLORATION WITH WOUND VAC APPLICATION TO LEFT ARM;  Surgeon: Angelia Mould, MD;  Location: [facility];  Service: Vascular;  Laterality: Left;       Home Medications    Prior to Admission medications   Medication Sig Start Date End Date Taking? Authorizing Provider  albuterol (VENTOLIN HFA) 108 (90 Base) MCG/ACT inhaler Inhale 2 puffs into the lungs every 6 (six) hours as needed for wheezing or shortness of breath.   Yes [provider]  budesonide-formoterol (SYMBICORT) 80-4.5 MCG/ACT  inhaler Inhale 2 puffs into the lungs 2 (two) times daily. 09/08/17  Yes Tanda Rockers, MD  clopidogrel (PLAVIX) 75 MG tablet Take 1 tablet (75 mg total) by mouth daily. 07/15/17  Yes Rhyne, Hulen Shouts, PA-C  diltiazem (CARDIZEM CD) 120 MG 24 hr capsule Take 1 capsule (120 mg total) by mouth daily. 06/23/17 06/23/18 Yes Shahmehdi, Seyed A, MD  gabapentin (NEURONTIN) 100 MG capsule Take 100 mg by mouth 2 (two) times daily.   Yes [provider]  hydrALAZINE (APRESOLINE) 100 MG tablet Take 1 tablet by mouth twice a day take after HD on dialysis days 08/31/17  Yes [provider]  isosorbide mononitrate (IMDUR) 30 MG 24 hr tablet Take 1 tablet (30 mg total) by mouth daily. 06/17/17  Yes [name], MD  oxyCODONE (OXY IR/ROXICODONE) 5 MG immediate release tablet Take 5 mg by mouth 2 (two) times daily as needed. 09/22/17  Yes [provider]  Tiotropium Bromide Monohydrate (SPIRIVA RESPIMAT) 2.5 MCG/ACT AERS Inhale 2 puffs into the lungs daily. 09/08/17  Yes Tanda Rockers, MD  famotidine (PEPCID) 20 MG tablet One at bedtime Patient not taking: Reported on 09/26/2017 09/08/17   Tanda Rockers, MD  pantoprazole (PROTONIX) 40 MG tablet Take 1 tablet (40 mg total) by mouth daily. Take 30-60 min before first meal of the day Patient not taking: Reported on 09/26/2017 09/08/17   Tanda Rockers, MD    Family History Family History  Problem Relation Age of Onset  . Heart disease Mother   . Lung cancer Mother   . Heart disease Father   . Malignant hyperthermia Father   . COPD Father   . Throat cancer Sister   . Esophageal cancer Sister   . Hypertension Other   . COPD Other   . Colon cancer Neg Hx   . Colon polyps Neg Hx   . Rectal cancer Neg Hx   . Stomach cancer Neg Hx     Social History Social History   Tobacco Use  . Smoking status: Current Every Day Smoker    Packs/day: 0.50    Years: 43.00    Pack years: 21.50    Types: Cigarettes    Start date: 08/13/1973  .  Smokeless tobacco: Never Used  Substance Use Topics  . Alcohol use: Yes    Frequency: Never    Comment: quit drinking in 2017  . Drug use: Yes    Types: Marijuana, Cocaine    Comment: quit in 2017"     Allergies   Aspirin; Clonidine derivatives; and Tramadol   Review  of Systems Review of Systems  All other systems reviewed and are negative.    Physical Exam Updated Vital Signs BP (!) 155/78 (BP Location: Right Arm)   Pulse 87   Temp 98.7 F (37.1 C) (Oral)   Resp (!) 22   Ht 5\' 9"  (1.753 m)   Wt 72.1 kg (159 lb)   SpO2 97%   BMI 23.48 kg/m   Physical Exam  Constitutional: He is oriented to person, place, and time. He appears well-developed and well-nourished. He does not appear ill.  HENT:  Head: Normocephalic and atraumatic.  Right Ear: External ear normal.  Left Ear: External ear normal.  Eyes: Conjunctivae and EOM are normal. Pupils are equal, round, and reactive to light.  Neck: Normal range of motion and phonation normal. Neck supple.  Cardiovascular: Normal rate, regular rhythm and normal heart sounds.  Pulmonary/Chest: Effort normal and breath sounds normal. No respiratory distress. He exhibits no bony tenderness.  Somewhat decreased air movement left base greater than right base.  Abdominal: Soft. He exhibits distension (Mild). There is no tenderness.  Musculoskeletal: Normal range of motion.  Neurological: He is alert and oriented to person, place, and time. No cranial nerve deficit or sensory deficit. He exhibits normal muscle tone. Coordination normal.  Skin: Skin is warm, dry and intact.  Psychiatric: He has a normal mood and affect. His behavior is normal. Judgment and thought content normal.  Nursing note and vitals reviewed.    ED Treatments / Results  Labs (all labs ordered are listed, but only abnormal results are displayed) Labs Reviewed  CBC WITH DIFFERENTIAL/PLATELET - Abnormal; Notable for the following components:      Result Value    RBC 4.09 (*)    Hemoglobin 11.0 (*)    HCT 32.9 (*)    RDW 18.2 (*)    Platelets 124 (*)    All other components within normal limits  I-STAT CHEM 8, ED - Abnormal; Notable for the following components:   Sodium 134 (*)    Chloride 94 (*)    BUN 26 (*)    Creatinine, Ser 7.80 (*)    Glucose, Bld 103 (*)    Calcium, Ion 1.03 (*)    TCO2 33 (*)    Hemoglobin 11.9 (*)    HCT 35.0 (*)    All other components within normal limits    EKG  EKG Interpretation  Date/Time:  Monday September 26 2017 19:42:06 EDT Ventricular Rate:  120 PR Interval:    QRS Duration: 164 QT Interval:  432 QTC Calculation: 611 R Axis:   -123 Text Interpretation:  Sinus tachycardia Consider left ventricular hypertrophy Anterior Q waves, possibly due to LVH Repol abnrm suggests ischemia, lateral leads Prolonged QT interval Since last tracing QT has lengthened Confirmed by Daleen Bo [phone]) on 09/26/2017 8:33:24 PM       Radiology   Procedures Procedures (including critical care time)  Medications Ordered in ED Medications - No data to display   Initial Impression / Assessment and Plan / ED Course  I have reviewed the triage vital signs and the nursing notes.  Pertinent labs & imaging results that were available during my care of the patient were reviewed by me and considered in my medical decision making (see chart for details).  Clinical Course as of Sep 29 1025  Thu Sep 29, 2017  1025 Screening evaluation has been done to evaluate for abdominal swelling.   [EW]  3903 Metabolic panel is indicative of chronic  renal disease, without acute unstable abnormalities.  I-stat Chem 8, ED(!) [EW]  1026 Consistent with stable hemoglobin and normal white count.  CBC with Differential(!) [EW]  1026 No pulmonary edema or pneumonia  DG Chest 2 View [EW]    Clinical Course User Index [EW] Daleen Bo, MD     No data found.  At discharge- reevaluation with update and discussion. After initial  assessment and treatment, an updated evaluation reveals he remains comfortable and has no further complaints.Daleen Bo   MDM-abdominal swelling and discomfort with apparent ascites.  This is a recurrent problem.  No acute respiratory distress.  No evidence for acute COPD exacerbation.  Patient was dialyzed today and is not in acute pulmonary edema.  Doubt ACS, or pneumonia.  Nursing Notes Reviewed/ Care Coordinated Applicable Imaging Reviewed Interpretation of Laboratory Data incorporated into ED treatment  The patient appears reasonably screened and/or stabilized for discharge and I doubt any other medical condition or other [facility] requiring further screening, evaluation, or treatment in the ED at this time prior to discharge.  Plan: Home Medications-continue current medications; Home Treatments-usual diet continue dialysis; return here if the recommended treatment, does not improve the symptoms; Recommended follow up-PCP, follow-up as needed.    Final Clinical Impressions(s) / ED Diagnoses   Final diagnoses:  Other ascites    ED Discharge Orders    None       Daleen Bo, MD 09/29/17 1035

## 2017-09-26 NOTE — Discharge Instructions (Signed)
Stay on a low-salt diet.  Avoid all added salt and foods which contain high sodium concentrations.  Continue your usual dialysis treatments.  Call the gastroenterologist for a follow-up appointment as soon as possible to arrange for further evaluation and treatment.  They will be able to order a paracentesis, if that is needed.

## 2017-09-26 NOTE — ED Triage Notes (Signed)
Pt coming from home by ems. Pt called out earlier today around 2pm about sob and was given a nebulizer treatment and was ok. Tonight he called out for the same reason pt had wheezing in all fields, pt was given 5 of albuterol by ems, pt does have hx of copd. Pt states he went to dialysis today, and also feels that his stomache is retaining fluids over the last two days.

## 2017-09-27 DIAGNOSIS — D638 Anemia in other chronic diseases classified elsewhere: Secondary | ICD-10-CM | POA: Diagnosis not present

## 2017-09-27 DIAGNOSIS — Z720 Tobacco use: Secondary | ICD-10-CM | POA: Diagnosis not present

## 2017-09-27 DIAGNOSIS — I2510 Atherosclerotic heart disease of native coronary artery without angina pectoris: Secondary | ICD-10-CM | POA: Diagnosis not present

## 2017-09-27 DIAGNOSIS — F419 Anxiety disorder, unspecified: Secondary | ICD-10-CM | POA: Diagnosis not present

## 2017-09-27 DIAGNOSIS — N186 End stage renal disease: Secondary | ICD-10-CM | POA: Diagnosis not present

## 2017-09-27 DIAGNOSIS — B1910 Unspecified viral hepatitis B without hepatic coma: Secondary | ICD-10-CM | POA: Diagnosis not present

## 2017-09-27 DIAGNOSIS — R05 Cough: Secondary | ICD-10-CM | POA: Diagnosis not present

## 2017-09-27 DIAGNOSIS — J449 Chronic obstructive pulmonary disease, unspecified: Secondary | ICD-10-CM | POA: Diagnosis not present

## 2017-09-27 DIAGNOSIS — I480 Paroxysmal atrial fibrillation: Secondary | ICD-10-CM | POA: Diagnosis not present

## 2017-09-27 DIAGNOSIS — I10 Essential (primary) hypertension: Secondary | ICD-10-CM | POA: Diagnosis not present

## 2017-09-27 DIAGNOSIS — B182 Chronic viral hepatitis C: Secondary | ICD-10-CM | POA: Diagnosis not present

## 2017-09-28 ENCOUNTER — Emergency Department (HOSPITAL_COMMUNITY): Payer: Medicare Other

## 2017-09-28 ENCOUNTER — Encounter (HOSPITAL_COMMUNITY): Payer: Self-pay

## 2017-09-28 ENCOUNTER — Inpatient Hospital Stay (HOSPITAL_COMMUNITY)
Admission: EM | Admit: 2017-09-28 | Discharge: 2017-09-30 | DRG: 947 | Disposition: A | Discharge status: Home / Self Care | Payer: Medicare Other | Attending: Family Medicine | Admitting: Family Medicine

## 2017-09-28 ENCOUNTER — Other Ambulatory Visit: Payer: Self-pay

## 2017-09-28 DIAGNOSIS — B1920 Unspecified viral hepatitis C without hepatic coma: Secondary | ICD-10-CM | POA: Diagnosis present

## 2017-09-28 DIAGNOSIS — N186 End stage renal disease: Secondary | ICD-10-CM | POA: Diagnosis not present

## 2017-09-28 DIAGNOSIS — E440 Moderate protein-calorie malnutrition: Secondary | ICD-10-CM | POA: Diagnosis present

## 2017-09-28 DIAGNOSIS — K219 Gastro-esophageal reflux disease without esophagitis: Secondary | ICD-10-CM | POA: Diagnosis present

## 2017-09-28 DIAGNOSIS — K7460 Unspecified cirrhosis of liver: Secondary | ICD-10-CM | POA: Diagnosis present

## 2017-09-28 DIAGNOSIS — Z79899 Other long term (current) drug therapy: Secondary | ICD-10-CM

## 2017-09-28 DIAGNOSIS — F17210 Nicotine dependence, cigarettes, uncomplicated: Secondary | ICD-10-CM | POA: Diagnosis present

## 2017-09-28 DIAGNOSIS — F1010 Alcohol abuse, uncomplicated: Secondary | ICD-10-CM | POA: Diagnosis present

## 2017-09-28 DIAGNOSIS — I2510 Atherosclerotic heart disease of native coronary artery without angina pectoris: Secondary | ICD-10-CM | POA: Diagnosis present

## 2017-09-28 DIAGNOSIS — J441 Chronic obstructive pulmonary disease with (acute) exacerbation: Secondary | ICD-10-CM | POA: Diagnosis not present

## 2017-09-28 DIAGNOSIS — I252 Old myocardial infarction: Secondary | ICD-10-CM

## 2017-09-28 DIAGNOSIS — D509 Iron deficiency anemia, unspecified: Secondary | ICD-10-CM | POA: Diagnosis not present

## 2017-09-28 DIAGNOSIS — K7469 Other cirrhosis of liver: Secondary | ICD-10-CM | POA: Diagnosis present

## 2017-09-28 DIAGNOSIS — R1084 Generalized abdominal pain: Secondary | ICD-10-CM | POA: Diagnosis not present

## 2017-09-28 DIAGNOSIS — B181 Chronic viral hepatitis B without delta-agent: Secondary | ICD-10-CM | POA: Diagnosis present

## 2017-09-28 DIAGNOSIS — I120 Hypertensive chronic kidney disease with stage 5 chronic kidney disease or end stage renal disease: Secondary | ICD-10-CM | POA: Diagnosis present

## 2017-09-28 DIAGNOSIS — D631 Anemia in chronic kidney disease: Secondary | ICD-10-CM | POA: Diagnosis not present

## 2017-09-28 DIAGNOSIS — I10 Essential (primary) hypertension: Secondary | ICD-10-CM | POA: Diagnosis present

## 2017-09-28 DIAGNOSIS — I4581 Long QT syndrome: Secondary | ICD-10-CM | POA: Diagnosis present

## 2017-09-28 DIAGNOSIS — K567 Ileus, unspecified: Secondary | ICD-10-CM | POA: Diagnosis present

## 2017-09-28 DIAGNOSIS — R197 Diarrhea, unspecified: Secondary | ICD-10-CM | POA: Diagnosis not present

## 2017-09-28 DIAGNOSIS — R188 Other ascites: Principal | ICD-10-CM | POA: Diagnosis present

## 2017-09-28 DIAGNOSIS — R109 Unspecified abdominal pain: Secondary | ICD-10-CM | POA: Diagnosis not present

## 2017-09-28 DIAGNOSIS — Z992 Dependence on renal dialysis: Secondary | ICD-10-CM

## 2017-09-28 DIAGNOSIS — Z6822 Body mass index (BMI) 22.0-22.9, adult: Secondary | ICD-10-CM

## 2017-09-28 DIAGNOSIS — Z7902 Long term (current) use of antithrombotics/antiplatelets: Secondary | ICD-10-CM

## 2017-09-28 DIAGNOSIS — N2581 Secondary hyperparathyroidism of renal origin: Secondary | ICD-10-CM | POA: Diagnosis not present

## 2017-09-28 DIAGNOSIS — R0602 Shortness of breath: Secondary | ICD-10-CM | POA: Diagnosis not present

## 2017-09-28 DIAGNOSIS — Z955 Presence of coronary angioplasty implant and graft: Secondary | ICD-10-CM

## 2017-09-28 DIAGNOSIS — R9431 Abnormal electrocardiogram [ECG] [EKG]: Secondary | ICD-10-CM | POA: Diagnosis present

## 2017-09-28 LAB — CBC WITH DIFFERENTIAL/PLATELET
Basophils Absolute: 0 10*3/uL (ref 0.0–0.1)
Basophils Relative: 0 %
Eosinophils Absolute: 0 10*3/uL (ref 0.0–0.7)
Eosinophils Relative: 0 %
HCT: 32.6 % — ABNORMAL LOW (ref 39.0–52.0)
Hemoglobin: 10.9 g/dL — ABNORMAL LOW (ref 13.0–17.0)
Lymphocytes Relative: 10 %
Lymphs Abs: 0.6 10*3/uL — ABNORMAL LOW (ref 0.7–4.0)
MCH: 26.5 pg (ref 26.0–34.0)
MCHC: 33.4 g/dL (ref 30.0–36.0)
MCV: 79.3 fL (ref 78.0–100.0)
Monocytes Absolute: 0.2 10*3/uL (ref 0.1–1.0)
Monocytes Relative: 3 %
Neutro Abs: 4.9 10*3/uL (ref 1.7–7.7)
Neutrophils Relative %: 87 %
Platelets: 148 10*3/uL — ABNORMAL LOW (ref 150–400)
RBC: 4.11 MIL/uL — ABNORMAL LOW (ref 4.22–5.81)
RDW: 18.4 % — ABNORMAL HIGH (ref 11.5–15.5)
WBC: 5.6 10*3/uL (ref 4.0–10.5)

## 2017-09-28 LAB — COMPREHENSIVE METABOLIC PANEL
ALT: 12 U/L — ABNORMAL LOW (ref 17–63)
AST: 21 U/L (ref 15–41)
Albumin: 3.7 g/dL (ref 3.5–5.0)
Alkaline Phosphatase: 141 U/L — ABNORMAL HIGH (ref 38–126)
Anion gap: 13 (ref 5–15)
BUN: 14 mg/dL (ref 6–20)
CO2: 26 mmol/L (ref 22–32)
Calcium: 9.3 mg/dL (ref 8.9–10.3)
Chloride: 93 mmol/L — ABNORMAL LOW (ref 101–111)
Creatinine, Ser: 5.12 mg/dL — ABNORMAL HIGH (ref 0.61–1.24)
GFR calc Af Amer: 13 mL/min — ABNORMAL LOW (ref >60)
GFR calc non Af Amer: 12 mL/min — ABNORMAL LOW (ref >60)
Glucose, Bld: 116 mg/dL — ABNORMAL HIGH (ref 65–99)
Potassium: 4.6 mmol/L (ref 3.5–5.1)
Sodium: 132 mmol/L — ABNORMAL LOW (ref 135–145)
Total Bilirubin: 0.8 mg/dL (ref 0.3–1.2)
Total Protein: 7.1 g/dL (ref 6.5–8.1)

## 2017-09-28 LAB — LIPASE, BLOOD: Lipase: 23 U/L (ref 11–51)

## 2017-09-28 LAB — PROTIME-INR
INR: 1.14
Prothrombin Time: 14.5 seconds (ref 11.4–15.2)

## 2017-09-28 MED ORDER — IPRATROPIUM-ALBUTEROL 0.5-2.5 (3) MG/3ML IN SOLN
3.0000 mL | Freq: Once | RESPIRATORY_TRACT | Status: AC
  Administered 2017-09-28: 3 mL via RESPIRATORY_TRACT
  Filled 2017-09-28: qty 3

## 2017-09-28 MED ORDER — ONDANSETRON HCL 4 MG/2ML IJ SOLN
4.0000 mg | Freq: Once | INTRAMUSCULAR | Status: AC
  Administered 2017-09-28: 4 mg via INTRAVENOUS
  Filled 2017-09-28: qty 2

## 2017-09-28 MED ORDER — HYDROMORPHONE HCL 1 MG/ML IJ SOLN
0.5000 mg | INTRAMUSCULAR | Status: AC | PRN
  Administered 2017-09-28 – 2017-09-29 (×2): 0.5 mg via INTRAVENOUS
  Filled 2017-09-28 (×2): qty 1

## 2017-09-28 MED ORDER — BARIUM SULFATE 2.1 % PO SUSP
ORAL | Status: AC
  Filled 2017-09-28: qty 2

## 2017-09-28 MED ORDER — METHYLPREDNISOLONE SODIUM SUCC 125 MG IJ SOLR
125.0000 mg | Freq: Once | INTRAMUSCULAR | Status: AC
  Administered 2017-09-28: 125 mg via INTRAVENOUS
  Filled 2017-09-28: qty 2

## 2017-09-28 NOTE — ED Notes (Signed)
Patient transported to CT 

## 2017-09-28 NOTE — ED Triage Notes (Signed)
Pt arrived via [facility] frm hm; per EMS pt had dialysis today which he completed; c/o increased SOB w/ general abd pn x past 5 hrs; Pt has hx of COPD, Ascites; pt was here 2 days ago; Pt saw PCP yesterday and rec'd order for prednisone which he has not started; 178/118; 112; 18; 95 on RA; pt refused 12 lead and refused CBG

## 2017-09-28 NOTE — ED Provider Notes (Addendum)
[facility] EMERGENCY DEPARTMENT Provider Note   CSN: [id number] Arrival date & time: 09/28/17  1733     History   Chief Complaint Chief Complaint  Patient presents with  . Shortness of Breath    HPI [name] is a 57 y.o. male.  HPI Pt presented to the ED with complaints of abdominal pain. Sx started today.  The pain is around the navel.  No nausea or vomiting.  He has not tried to eat.  His abdomen feels swollen.  Pain increases with palpation and movement.  Pt has history of chronic kidney disease.Marland Kitchen  He is on dialysis.  He went for treatment today.  He saw his PCP yesterday and was prescribed prednisone.  He has not started taking it yet.  He has been having trouble with cough and shortness of breath.  He has copd and continues to smoke.  Patient has not tried any breathing treatments.  His symptoms were worsening today so he called EMS and was brought to the emergency room. Past Medical History:  Diagnosis Date  . Adenomatous colon polyp    tubular  . Anemia   . Anxiety   . Arthritis    left shoulder  . Atherosclerosis of aorta ([place])   . Cardiomegaly   . Chest pain    DATE UNKNOWN, C/O PERIODICALLY  . Cocaine abuse ([place])   . COPD exacerbation ([place]) 08/17/2016  . Coronary artery disease    stent 02/22/17  . Dialysis patient ([place])    Monday-Wednesday-Friday  . ESRD (end stage renal disease) on dialysis ([place])    "[name]; MWF" (07/04/2017)  . GERD (gastroesophageal reflux disease)    DATE UNKNOWN  . Hemorrhoids   . Hepatitis B, chronic ([place])   . Hepatitis C   . History of kidney stones   . Hyperkalemia   . Hypertension   . Metabolic bone disease    Patient denies  . Myocardial infarction ([place])   . Pneumonia   . Pulmonary edema   . Renal disorder   . Renal insufficiency   . Shortness of breath dyspnea    " for the last past year with this dialysis"  . Solitary rectal ulcer syndrome   . Tubular adenoma of colon     Patient  Active Problem List   Diagnosis Date Noted  . Sinus congestion 09/03/2017  . Symptomatic anemia 09/02/2017  . Other cirrhosis of liver ([place]) 09/02/2017  . Left bundle branch block 09/02/2017  . Mitral stenosis 09/02/2017  . Hematochezia 07/15/2017  . Wide-complex tachycardia ([place])   . Endotracheally intubated   . ESRD (end stage renal disease) ([place]) 07/04/2017  . Acute respiratory failure with hypoxia ([place]) 06/18/2017  . CKD (chronic kidney disease) stage V requiring chronic dialysis ([place]) 06/18/2017  . History of Cocaine abuse ([place]) 06/18/2017  . Hypertension 06/18/2017  . Infection of AV graft for dialysis ([place]) 06/18/2017  . Anxiety 06/18/2017  . Anemia due to chronic kidney disease 06/18/2017  . Atrial flutter with rapid ventricular response ([place]) 06/18/2017  . Personality disorder ([place]) 06/13/2017  . Cellulitis 06/12/2017  . Adjustment disorder with mixed anxiety and depressed mood 06/10/2017  . Suicidal ideation 06/10/2017  . Arm wound, left, sequela 06/10/2017  . Dyspnea on exertion 05/29/2017  . Tachycardia 05/29/2017  . Hyperkalemia 05/22/2017  . Acute metabolic encephalopathy   . Anemia 04/23/2017  . Ascites 04/23/2017  . COPD (chronic obstructive pulmonary disease) ([place]) 04/23/2017  . Acute on chronic respiratory failure  with hypoxia ([place]) 03/25/2017  . Arrhythmia 03/25/2017  . COPD GOLD II/ still smoking 09/27/2016  . Essential hypertension 09/27/2016  . Fluid overload 08/30/2016  . COPD exacerbation ([place]) 08/17/2016  . Hypertensive urgency 08/17/2016  . Respiratory failure ([place]) 08/17/2016  . Problem with dialysis access ([place]) 07/23/2016  . End stage renal disease ([place]) 12/02/2015  . Chronic hepatitis B ([place]) 03/05/2014  . Chronic hepatitis C without hepatic coma ([place]) 03/05/2014  . Internal hemorrhoids with bleeding, swelling and itching 03/05/2014  . Thrombocytopenia ([place]) 03/05/2014  . Chest pain 02/27/2014  . Alcohol abuse 04/14/2009  . Cigarette smoker  04/14/2009  . GANGLION CYST 04/14/2009    Past Surgical History:  Procedure Laterality Date  . A/V FISTULAGRAM Left 05/26/2017   Procedure: A/V FISTULAGRAM;  Surgeon: Conrad Bern, MD;  Location: [facility];  Service: Cardiovascular;  Laterality: Left;  . APPLICATION OF WOUND VAC Left 06/14/2017   Procedure: APPLICATION OF WOUND VAC;  Surgeon: Katha Cabal, MD;  Location: [facility];  Service: Vascular;  Laterality: Left;  . AV FISTULA PLACEMENT  2012   BELIEVED WAS PLACED IN JUNE  . AV FISTULA PLACEMENT Right 08/09/2017   Procedure: Creation Right arm ARTERIOVENOUS BRACHIOCEPOHALIC FISTULA;  Surgeon: Elam Dutch, MD;  Location: [facility];  Service: Vascular;  Laterality: Right;  . COLONOSCOPY    . CORONARY STENT INTERVENTION N/A 02/22/2017   Procedure: CORONARY STENT INTERVENTION;  Surgeon: Nigel Mormon, MD;  Location: [facility];  Service: Cardiovascular;  Laterality: N/A;  . FLEXIBLE SIGMOIDOSCOPY N/A 07/15/2017   Procedure: FLEXIBLE SIGMOIDOSCOPY;  Surgeon: Carol Ada, MD;  Location: [facility];  Service: Endoscopy;  Laterality: N/A;  . HEMORRHOID BANDING    . I&D EXTREMITY Left 06/01/2017   Procedure: IRRIGATION AND DEBRIDEMENT LEFT ARM HEMATOMA WITH LIGATION OF LEFT ARM AV FISTULA;  Surgeon: Elam Dutch, MD;  Location: [facility];  Service: Vascular;  Laterality: Left;  . I&D EXTREMITY Left 06/14/2017   Procedure: IRRIGATION AND DEBRIDEMENT EXTREMITY;  Surgeon: Katha Cabal, MD;  Location: [facility];  Service: Vascular;  Laterality: Left;  . INSERTION OF DIALYSIS CATHETER  05/30/2017  . INSERTION OF DIALYSIS CATHETER N/A 05/30/2017   Procedure: INSERTION OF DIALYSIS CATHETER;  Surgeon: Elam Dutch, MD;  Location: [facility];  Service: Vascular;  Laterality: N/A;  . IR PARACENTESIS  08/30/2017  . LEFT HEART CATH AND CORONARY ANGIOGRAPHY N/A 02/22/2017   Procedure: LEFT HEART CATH AND CORONARY ANGIOGRAPHY;  Surgeon: Nigel Mormon, MD;   Location: [facility];  Service: Cardiovascular;  Laterality: N/A;  . LIGATION OF ARTERIOVENOUS  FISTULA Left 10/19/4494   Procedure: Plication of Left Arm Arteriovenous Fistula;  Surgeon: Elam Dutch, MD;  Location: [facility];  Service: Vascular;  Laterality: Left;  . POLYPECTOMY    . REVISON OF ARTERIOVENOUS FISTULA Left 7/59/1638   Procedure: PLICATION OF DISTAL ANEURYSMAL SEGEMENT OF LEFT UPPER ARM ARTERIOVENOUS FISTULA;  Surgeon: Elam Dutch, MD;  Location: [facility];  Service: Vascular;  Laterality: Left;  . REVISON OF ARTERIOVENOUS FISTULA Left 4/66/5993   Procedure: Plication of Left Upper Arm Fistula ;  Surgeon: Waynetta Sandy, MD;  Location: [facility];  Service: Vascular;  Laterality: Left;  . SKIN GRAFT SPLIT THICKNESS LEG / FOOT Left    SKIN GRAFT SPLIT THICKNESS LEFT ARM DONOR SITE: LEFT ANTERIOR THIGH  . SKIN SPLIT GRAFT Left 07/04/2017   Procedure: SKIN GRAFT SPLIT THICKNESS LEFT ARM DONOR SITE: LEFT  ANTERIOR THIGH;  Surgeon: Elam Dutch, MD;  Location: [facility];  Service: Vascular;  Laterality: Left;  . THROMBECTOMY W/ EMBOLECTOMY Left 06/05/2017   Procedure: EXPLORATION OF LEFT ARM FOR BLEEDING; OVERSEWED PROXIMAL FISTULA;  Surgeon: Angelia Mould, MD;  Location: [facility];  Service: Vascular;  Laterality: Left;  . WOUND EXPLORATION Left 06/03/2017   Procedure: WOUND EXPLORATION WITH WOUND VAC APPLICATION TO LEFT ARM;  Surgeon: Angelia Mould, MD;  Location: [facility];  Service: Vascular;  Laterality: Left;       Home Medications    Prior to Admission medications   Medication Sig Start Date End Date Taking? Authorizing Provider  albuterol (VENTOLIN HFA) 108 (90 Base) MCG/ACT inhaler Inhale 2 puffs into the lungs every 6 (six) hours as needed for wheezing or shortness of breath.   Yes [provider]  budesonide-formoterol (SYMBICORT) 80-4.5 MCG/ACT inhaler Inhale 2 puffs into the lungs 2 (two) times daily. 09/08/17  Yes Tanda Rockers, MD    clopidogrel (PLAVIX) 75 MG tablet Take 1 tablet (75 mg total) by mouth daily. 07/15/17  Yes Rhyne, Hulen Shouts, PA-C  diltiazem (CARDIZEM CD) 120 MG 24 hr capsule Take 1 capsule (120 mg total) by mouth daily. 06/23/17 06/23/18 Yes Shahmehdi, Seyed A, MD  gabapentin (NEURONTIN) 100 MG capsule Take 100 mg by mouth 2 (two) times daily.   Yes [provider]  hydrALAZINE (APRESOLINE) 100 MG tablet Take 1 tablet by mouth twice a day take after HD on dialysis days 08/31/17  Yes [provider]  isosorbide mononitrate (IMDUR) 30 MG 24 hr tablet Take 1 tablet (30 mg total) by mouth daily. 06/17/17  Yes [name], MD  oxyCODONE (OXY IR/ROXICODONE) 5 MG immediate release tablet Take 5 mg by mouth 2 (two) times daily as needed. 09/22/17  Yes [provider]  Tiotropium Bromide Monohydrate (SPIRIVA RESPIMAT) 2.5 MCG/ACT AERS Inhale 2 puffs into the lungs daily. 09/08/17  Yes Tanda Rockers, MD  famotidine (PEPCID) 20 MG tablet One at bedtime Patient not taking: Reported on 09/26/2017 09/08/17   Tanda Rockers, MD  pantoprazole (PROTONIX) 40 MG tablet Take 1 tablet (40 mg total) by mouth daily. Take 30-60 min before first meal of the day Patient not taking: Reported on 09/26/2017 09/08/17   Tanda Rockers, MD    Family History Family History  Problem Relation Age of Onset  . Heart disease Mother   . Lung cancer Mother   . Heart disease Father   . Malignant hyperthermia Father   . COPD Father   . Throat cancer Sister   . Esophageal cancer Sister   . Hypertension Other   . COPD Other   . Colon cancer Neg Hx   . Colon polyps Neg Hx   . Rectal cancer Neg Hx   . Stomach cancer Neg Hx     Social History Social History   Tobacco Use  . Smoking status: Current Every Day Smoker    Packs/day: 0.50    Years: 43.00    Pack years: 21.50    Types: Cigarettes    Start date: 08/13/1973  . Smokeless tobacco: Never Used  Substance Use Topics  . Alcohol use: Yes    Frequency: Never     Comment: quit drinking in 2017  . Drug use: Yes    Types: Marijuana, Cocaine    Comment: quit in 2017"     Allergies   Aspirin; Clonidine derivatives; and Tramadol   Review of Systems Review  of Systems  All other systems reviewed and are negative.    Physical Exam Updated Vital Signs BP (!) 115/43   Pulse (!) 108   Resp 16   Ht 1.753 m (5\' 9" )   Wt 71.2 kg (157 lb)   SpO2 100%   BMI 23.18 kg/m   Physical Exam  Constitutional: He appears well-developed and well-nourished. No distress.  HENT:  Head: Normocephalic and atraumatic.  Right Ear: External ear normal.  Left Ear: External ear normal.  Eyes: Conjunctivae are normal. Right eye exhibits no discharge. Left eye exhibits no discharge. No scleral icterus.  Neck: Neck supple. No tracheal deviation present.  Cardiovascular: Regular rhythm and intact distal pulses. Tachycardia present.  Pulmonary/Chest: Effort normal. No stridor. Tachypnea noted. No respiratory distress. He has wheezes. He has no rales.  Abdominal: Soft. Bowel sounds are normal. He exhibits distension. There is no tenderness. There is no rigidity, no rebound and no guarding. No hernia.  Musculoskeletal: He exhibits no edema or tenderness.  Neurological: He is alert. He has normal strength. No cranial nerve deficit (no facial droop, extraocular movements intact, no slurred speech) or sensory deficit. He exhibits normal muscle tone. He displays no seizure activity. Coordination normal.  Skin: Skin is warm and dry. No rash noted.  Psychiatric: He has a normal mood and affect.  Nursing note and vitals reviewed.    ED Treatments / Results  Labs (all labs ordered are listed, but only abnormal results are displayed) Labs Reviewed  COMPREHENSIVE METABOLIC PANEL - Abnormal; Notable for the following components:      Result Value   Sodium 132 (*)    Chloride 93 (*)    Glucose, Bld 116 (*)    Creatinine, Ser 5.12 (*)    ALT 12 (*)    Alkaline Phosphatase  141 (*)    GFR calc non Af Amer 12 (*)    GFR calc Af Amer 13 (*)    All other components within normal limits  CBC WITH DIFFERENTIAL/PLATELET - Abnormal; Notable for the following components:   RBC 4.11 (*)    Hemoglobin 10.9 (*)    HCT 32.6 (*)    RDW 18.4 (*)    Platelets 148 (*)    Lymphs Abs 0.6 (*)    All other components within normal limits  LIPASE, BLOOD  PROTIME-INR    EKG  EKG Interpretation  Date/Time:  Wednesday September 28 2017 17:50:51 EDT Ventricular Rate:  108 PR Interval:    QRS Duration: 169 QT Interval:  445 QTC Calculation: 597 R Axis:   -95 Text Interpretation:  Sinus tachycardia Atrial premature complexes Nonspecific IVCD with LAD Consider left ventricular hypertrophy Probable lateral infarct, age indeterminate Borderline T abnormalities, lateral leads No significant change since last tracing Confirmed by Dorie Rank [phone]) on 09/28/2017 8:38:39 PM       Radiology Ct Abdomen Pelvis Wo Contrast  Result Date: 09/28/2017 CLINICAL DATA:  History of ascites. Abdominal distension. Question small bowel obstruction on radiographs. EXAM: CT ABDOMEN AND PELVIS WITHOUT CONTRAST TECHNIQUE: Multidetector CT imaging of the abdomen and pelvis was performed following the standard protocol without IV contrast. COMPARISON:  Abdominal radiographs earlier this day.  CT 09/02/2017 FINDINGS: Lower chest: The heart is enlarged. Minimal pericardial thickening/fluid. Left basilar atelectasis. Hepatobiliary: Slight capsular nodularity which may represent cirrhosis. No discrete focal lesion. Gallbladder is not well visualized due to adjacent ascites. Pancreas: No ductal dilatation. Limited assessment for peripancreatic inflammation given adjacent ascites. Spleen: Normal in size without focal  abnormality. Adrenals/Urinary Tract: Adrenal glands not as well visualized on the current exam in the absence of IV contrast. Atrophic kidneys consistent with chronic renal disease. Probable parenchymal  calcification in the upper right kidney. Advanced renovascular calcifications. Urinary bladder is nondistended. Stomach/Bowel: Administered enteric contrast within the stomach and proximal small bowel. Bowel loops upper normal caliber measuring 2.9 cm but no definite bowel dilatation to suggest obstruction. More distal small bowel is nondistended. No colonic distension. Probable clip in the distal sigmoid colon. Vascular/Lymphatic: Advanced aortic and branch atherosclerosis. Limited assessment for adenopathy given presence of ascites. Reproductive: Prostate is unremarkable. Other: Large volume intra-abdominopelvic ascites, progressed from prior CT. Chronic hyperdensity dependently in the is situs in the right pelvis, unchanged dating back to at least 2018 and of doubtful clinical significance. No free air. Musculoskeletal: Sequela of renal osteodystrophy with degenerative disc disease L5-S1. There are no acute or suspicious osseous abnormalities. IMPRESSION: 1. Slight small bowel prominence suggesting ileus. No evidence of obstruction. 2. Large volume intra-abdominopelvic ascites, increased from CT 1 month ago. 3. Advanced Aortic Atherosclerosis (ICD10-I70.0). Electronically Signed   By: Jeb Levering M.D.   On: 09/28/2017 23:55   Dg Abd Acute W/chest  Result Date: 09/28/2017 CLINICAL DATA:  Shortness of breath, abdominal pain, distention and diarrhea for 3 days. EXAM: DG ABDOMEN ACUTE W/ 1V CHEST COMPARISON:  PA and lateral chest 09/26/2017. CT abdomen and pelvis 09/02/2017. FINDINGS: Single-view of the chest demonstrates enlarged cardiopericardial silhouette consistent with cardiomegaly and small pericardial effusion seen on prior CT. There is pulmonary vascular congestion. No consolidative process, pneumothorax or effusion. Aortic atherosclerosis is seen. Dialysis catheter is unchanged. Two views of the abdomen show no free intraperitoneal air. There is centralization of bowel loops and increased density  of the abdomen consistent with ascites as seen on prior CT. Small bowel loops are mildly dilated up to 4.0 cm. IMPRESSION: Mild dilatation of small bowel loops could be due to ileus or obstruction. Findings consistent with ascites as seen on prior CT. Enlarged cardiopericardial silhouette consistent with cardiomegaly and small pericardial effusion as seen on prior CT. Pulmonary vascular congestion. Electronically Signed   By: Inge Rise M.D.   On: 09/28/2017 19:46    Procedures Procedures (including critical care time)  Medications Ordered in ED Medications  Barium Sulfate 2.1 % SUSP (not administered)  HYDROmorphone (DILAUDID) injection 0.5 mg (0.5 mg Intravenous Given 09/29/17 0007)  ondansetron (ZOFRAN) injection 4 mg (4 mg Intravenous Given 09/28/17 1958)  ipratropium-albuterol (DUONEB) 0.5-2.5 (3) MG/3ML nebulizer solution 3 mL (3 mLs Nebulization Given 09/28/17 1958)  methylPREDNISolone sodium succinate (SOLU-MEDROL) 125 mg/2 mL injection 125 mg (125 mg Intravenous Given 09/28/17 1958)     Initial Impression / Assessment and Plan / ED Course  I have reviewed the triage vital signs and the nursing notes.  Pertinent labs & imaging results that were available during my care of the patient were reviewed by me and considered in my medical decision making (see chart for details).  Clinical Course as of Sep 30 7  Wed Sep 28, 2017  2040 Labs notable for his chronic kidney disease.  No acute electrolyte abnormalities.   [JK]    Clinical Course User Index [JK] Dorie Rank, MD    Patient presented to the emergency room for evaluation of shortness of breath and abdominal swelling.  Patient's breathing issues seem to be related to a COPD exacerbation in association with some breathing difficulty from his ascites.  CT scan does not show any evidence of  bowel obstruction.  It does show large amount of increasing ascites.  Think the patient would benefit from a therapeutic paracentesis as well  as further monitoring of his COPD.  I will consult with medical service for admission and arrangement for paracentesis in the morning.  Final Clinical Impressions(s) / ED Diagnoses   Final diagnoses:  Other ascites  COPD exacerbation (HCC)        Dorie Rank, MD 09/29/17 0009

## 2017-09-28 NOTE — ED Triage Notes (Signed)
Patient to the ED via EMS with C/O abdominal pain and shortness of breath. States that the was seen here a day or two ago for the same complaint.  Patient completed his hemodialasis treatment today, went home then called EMS.

## 2017-09-29 ENCOUNTER — Inpatient Hospital Stay (HOSPITAL_COMMUNITY): Payer: Medicare Other

## 2017-09-29 ENCOUNTER — Encounter (HOSPITAL_COMMUNITY): Payer: Self-pay | Admitting: Student

## 2017-09-29 DIAGNOSIS — R9431 Abnormal electrocardiogram [ECG] [EKG]: Secondary | ICD-10-CM | POA: Diagnosis not present

## 2017-09-29 DIAGNOSIS — I252 Old myocardial infarction: Secondary | ICD-10-CM | POA: Diagnosis not present

## 2017-09-29 DIAGNOSIS — F17210 Nicotine dependence, cigarettes, uncomplicated: Secondary | ICD-10-CM | POA: Diagnosis present

## 2017-09-29 DIAGNOSIS — E440 Moderate protein-calorie malnutrition: Secondary | ICD-10-CM | POA: Diagnosis not present

## 2017-09-29 DIAGNOSIS — I10 Essential (primary) hypertension: Secondary | ICD-10-CM | POA: Diagnosis not present

## 2017-09-29 DIAGNOSIS — I4581 Long QT syndrome: Secondary | ICD-10-CM | POA: Diagnosis present

## 2017-09-29 DIAGNOSIS — I120 Hypertensive chronic kidney disease with stage 5 chronic kidney disease or end stage renal disease: Secondary | ICD-10-CM | POA: Diagnosis not present

## 2017-09-29 DIAGNOSIS — B1920 Unspecified viral hepatitis C without hepatic coma: Secondary | ICD-10-CM | POA: Diagnosis not present

## 2017-09-29 DIAGNOSIS — Z6822 Body mass index (BMI) 22.0-22.9, adult: Secondary | ICD-10-CM | POA: Diagnosis not present

## 2017-09-29 DIAGNOSIS — B181 Chronic viral hepatitis B without delta-agent: Secondary | ICD-10-CM | POA: Diagnosis not present

## 2017-09-29 DIAGNOSIS — F1010 Alcohol abuse, uncomplicated: Secondary | ICD-10-CM | POA: Diagnosis present

## 2017-09-29 DIAGNOSIS — Z955 Presence of coronary angioplasty implant and graft: Secondary | ICD-10-CM | POA: Diagnosis not present

## 2017-09-29 DIAGNOSIS — R188 Other ascites: Secondary | ICD-10-CM | POA: Diagnosis not present

## 2017-09-29 DIAGNOSIS — J441 Chronic obstructive pulmonary disease with (acute) exacerbation: Secondary | ICD-10-CM

## 2017-09-29 DIAGNOSIS — Z79899 Other long term (current) drug therapy: Secondary | ICD-10-CM | POA: Diagnosis not present

## 2017-09-29 DIAGNOSIS — Z7902 Long term (current) use of antithrombotics/antiplatelets: Secondary | ICD-10-CM | POA: Diagnosis not present

## 2017-09-29 DIAGNOSIS — K567 Ileus, unspecified: Secondary | ICD-10-CM | POA: Diagnosis not present

## 2017-09-29 DIAGNOSIS — N186 End stage renal disease: Secondary | ICD-10-CM | POA: Diagnosis not present

## 2017-09-29 DIAGNOSIS — K7469 Other cirrhosis of liver: Secondary | ICD-10-CM

## 2017-09-29 DIAGNOSIS — Z992 Dependence on renal dialysis: Secondary | ICD-10-CM | POA: Diagnosis not present

## 2017-09-29 DIAGNOSIS — K219 Gastro-esophageal reflux disease without esophagitis: Secondary | ICD-10-CM | POA: Diagnosis present

## 2017-09-29 DIAGNOSIS — I2510 Atherosclerotic heart disease of native coronary artery without angina pectoris: Secondary | ICD-10-CM | POA: Diagnosis not present

## 2017-09-29 HISTORY — PX: IR PARACENTESIS: IMG2679

## 2017-09-29 LAB — COMPREHENSIVE METABOLIC PANEL
ALT: 12 U/L — ABNORMAL LOW (ref 17–63)
AST: 21 U/L (ref 15–41)
Albumin: 3.8 g/dL (ref 3.5–5.0)
Alkaline Phosphatase: 138 U/L — ABNORMAL HIGH (ref 38–126)
Anion gap: 13 (ref 5–15)
BUN: 16 mg/dL (ref 6–20)
CO2: 27 mmol/L (ref 22–32)
Calcium: 9.5 mg/dL (ref 8.9–10.3)
Chloride: 92 mmol/L — ABNORMAL LOW (ref 101–111)
Creatinine, Ser: 5.69 mg/dL — ABNORMAL HIGH (ref 0.61–1.24)
GFR calc Af Amer: 12 mL/min — ABNORMAL LOW (ref >60)
GFR calc non Af Amer: 10 mL/min — ABNORMAL LOW (ref >60)
Glucose, Bld: 146 mg/dL — ABNORMAL HIGH (ref 65–99)
Potassium: 5 mmol/L (ref 3.5–5.1)
Sodium: 132 mmol/L — ABNORMAL LOW (ref 135–145)
Total Bilirubin: 0.7 mg/dL (ref 0.3–1.2)
Total Protein: 7.4 g/dL (ref 6.5–8.1)

## 2017-09-29 LAB — CBC
HCT: 34.3 % — ABNORMAL LOW (ref 39.0–52.0)
Hemoglobin: 11.5 g/dL — ABNORMAL LOW (ref 13.0–17.0)
MCH: 26.6 pg (ref 26.0–34.0)
MCHC: 33.5 g/dL (ref 30.0–36.0)
MCV: 79.2 fL (ref 78.0–100.0)
Platelets: 151 10*3/uL (ref 150–400)
RBC: 4.33 MIL/uL (ref 4.22–5.81)
RDW: 17.8 % — ABNORMAL HIGH (ref 11.5–15.5)
WBC: 4.8 10*3/uL (ref 4.0–10.5)

## 2017-09-29 LAB — TROPONIN I
Troponin I: 0.04 ng/mL (ref <0.03)
Troponin I: 0.04 ng/mL (ref <0.03)
Troponin I: 0.04 ng/mL (ref <0.03)

## 2017-09-29 LAB — BODY FLUID CELL COUNT WITH DIFFERENTIAL
Lymphs, Fluid: 1 %
Monocyte-Macrophage-Serous Fluid: 95 % — ABNORMAL HIGH (ref 50–90)
Neutrophil Count, Fluid: 4 % (ref 0–25)
Total Nucleated Cell Count, Fluid: 147 cu mm (ref 0–1000)

## 2017-09-29 LAB — MAGNESIUM
Magnesium: 2.4 mg/dL (ref 1.7–2.4)
Magnesium: 2.4 mg/dL (ref 1.7–2.4)

## 2017-09-29 LAB — PHOSPHORUS: Phosphorus: 4.3 mg/dL (ref 2.5–4.6)

## 2017-09-29 LAB — GRAM STAIN

## 2017-09-29 LAB — LACTATE DEHYDROGENASE, PLEURAL OR PERITONEAL FLUID: LD, Fluid: 100 U/L — ABNORMAL HIGH (ref 3–23)

## 2017-09-29 LAB — TSH: TSH: 0.436 u[IU]/mL (ref 0.350–4.500)

## 2017-09-29 LAB — PROTEIN, PLEURAL OR PERITONEAL FLUID: Total protein, fluid: 3.9 g/dL

## 2017-09-29 LAB — POTASSIUM: Potassium: 4.9 mmol/L (ref 3.5–5.1)

## 2017-09-29 LAB — ALBUMIN, PLEURAL OR PERITONEAL FLUID: Albumin, Fluid: 2.3 g/dL

## 2017-09-29 MED ORDER — THIAMINE HCL 100 MG/ML IJ SOLN: 100.0000 mg | Freq: Every day | INTRAMUSCULAR | Status: DC

## 2017-09-29 MED ORDER — HYDRALAZINE HCL 50 MG PO TABS: 100.0000 mg | ORAL_TABLET | Freq: Two times a day (BID) | ORAL | Status: DC

## 2017-09-29 MED ORDER — FOLIC ACID 1 MG PO TABS
1.0000 mg | ORAL_TABLET | Freq: Every day | ORAL | Status: DC
  Administered 2017-09-29: 1 mg via ORAL
  Filled 2017-09-29: qty 1

## 2017-09-29 MED ORDER — BISACODYL 10 MG RE SUPP: 10.0000 mg | Freq: Every day | RECTAL | Status: DC | PRN

## 2017-09-29 MED ORDER — GABAPENTIN 100 MG PO CAPS
100.0000 mg | ORAL_CAPSULE | Freq: Two times a day (BID) | ORAL | Status: DC
  Administered 2017-09-29 (×3): 100 mg via ORAL
  Filled 2017-09-29 (×3): qty 1

## 2017-09-29 MED ORDER — LEVALBUTEROL HCL 1.25 MG/0.5ML IN NEBU: 1.2500 mg | INHALATION_SOLUTION | RESPIRATORY_TRACT | Status: DC | PRN

## 2017-09-29 MED ORDER — SODIUM CHLORIDE 0.9% FLUSH: 3.0000 mL | INTRAVENOUS | Status: DC | PRN

## 2017-09-29 MED ORDER — GUAIFENESIN-DM 100-10 MG/5ML PO SYRP
5.0000 mL | ORAL_SOLUTION | ORAL | Status: DC | PRN
  Administered 2017-09-29 (×2): 5 mL via ORAL
  Filled 2017-09-29 (×2): qty 5

## 2017-09-29 MED ORDER — ZOLPIDEM TARTRATE 5 MG PO TABS
5.0000 mg | ORAL_TABLET | Freq: Every evening | ORAL | Status: DC | PRN
  Administered 2017-09-29: 5 mg via ORAL
  Filled 2017-09-29 (×2): qty 1

## 2017-09-29 MED ORDER — ACETAMINOPHEN 650 MG RE SUPP: 650.0000 mg | Freq: Four times a day (QID) | RECTAL | Status: DC | PRN

## 2017-09-29 MED ORDER — LORAZEPAM 1 MG PO TABS: 1.0000 mg | ORAL_TABLET | Freq: Four times a day (QID) | ORAL | Status: DC | PRN

## 2017-09-29 MED ORDER — OXYCODONE HCL 5 MG PO TABS
5.0000 mg | ORAL_TABLET | Freq: Two times a day (BID) | ORAL | Status: DC | PRN
  Administered 2017-09-29 (×3): 5 mg via ORAL
  Filled 2017-09-29 (×3): qty 1

## 2017-09-29 MED ORDER — VITAMIN B-1 100 MG PO TABS
100.0000 mg | ORAL_TABLET | Freq: Every day | ORAL | Status: DC
  Administered 2017-09-29: 100 mg via ORAL
  Filled 2017-09-29: qty 1

## 2017-09-29 MED ORDER — DILTIAZEM HCL ER COATED BEADS 120 MG PO CP24
120.0000 mg | ORAL_CAPSULE | Freq: Every day | ORAL | Status: DC
  Administered 2017-09-29: 120 mg via ORAL
  Filled 2017-09-29: qty 1

## 2017-09-29 MED ORDER — IPRATROPIUM-ALBUTEROL 0.5-2.5 (3) MG/3ML IN SOLN
3.0000 mL | Freq: Two times a day (BID) | RESPIRATORY_TRACT | Status: DC
  Administered 2017-09-30: 3 mL via RESPIRATORY_TRACT
  Filled 2017-09-29: qty 3

## 2017-09-29 MED ORDER — ADULT MULTIVITAMIN W/MINERALS CH
1.0000 | ORAL_TABLET | Freq: Every day | ORAL | Status: DC
  Administered 2017-09-29: 1 via ORAL
  Filled 2017-09-29: qty 1

## 2017-09-29 MED ORDER — DOCUSATE SODIUM 100 MG PO CAPS
200.0000 mg | ORAL_CAPSULE | Freq: Once | ORAL | Status: AC
  Administered 2017-09-29: 200 mg via ORAL
  Filled 2017-09-29: qty 2

## 2017-09-29 MED ORDER — ACETAMINOPHEN 325 MG PO TABS: 650.0000 mg | ORAL_TABLET | Freq: Four times a day (QID) | ORAL | Status: DC | PRN

## 2017-09-29 MED ORDER — DOCUSATE SODIUM 100 MG PO CAPS: 100.0000 mg | ORAL_CAPSULE | Freq: Two times a day (BID) | ORAL | Status: DC

## 2017-09-29 MED ORDER — HYDROCODONE-ACETAMINOPHEN 5-325 MG PO TABS
1.0000 | ORAL_TABLET | ORAL | Status: DC | PRN
  Administered 2017-09-29: 1 via ORAL
  Filled 2017-09-29: qty 1

## 2017-09-29 MED ORDER — SODIUM CHLORIDE 0.9% FLUSH
3.0000 mL | Freq: Two times a day (BID) | INTRAVENOUS | Status: DC
  Administered 2017-09-29 (×3): 3 mL via INTRAVENOUS

## 2017-09-29 MED ORDER — LIDOCAINE HCL (PF) 2 % IJ SOLN
INTRAMUSCULAR | Status: AC | PRN
  Administered 2017-09-29: 10 mL

## 2017-09-29 MED ORDER — METHYLPREDNISOLONE SODIUM SUCC 125 MG IJ SOLR
60.0000 mg | Freq: Two times a day (BID) | INTRAMUSCULAR | Status: AC
  Administered 2017-09-29 (×2): 60 mg via INTRAVENOUS
  Filled 2017-09-29 (×2): qty 2

## 2017-09-29 MED ORDER — MOMETASONE FURO-FORMOTEROL FUM 100-5 MCG/ACT IN AERO
2.0000 | INHALATION_SPRAY | Freq: Two times a day (BID) | RESPIRATORY_TRACT | Status: DC
  Administered 2017-09-29 – 2017-09-30 (×3): 2 via RESPIRATORY_TRACT
  Filled 2017-09-29: qty 8.8

## 2017-09-29 MED ORDER — IPRATROPIUM-ALBUTEROL 0.5-2.5 (3) MG/3ML IN SOLN
3.0000 mL | Freq: Four times a day (QID) | RESPIRATORY_TRACT | Status: DC
  Administered 2017-09-29 (×4): 3 mL via RESPIRATORY_TRACT
  Filled 2017-09-29 (×4): qty 3

## 2017-09-29 MED ORDER — SODIUM CHLORIDE 0.9 % IV SOLN: 250.0000 mL | INTRAVENOUS | Status: DC | PRN

## 2017-09-29 MED ORDER — CLOPIDOGREL BISULFATE 75 MG PO TABS
75.0000 mg | ORAL_TABLET | Freq: Every day | ORAL | Status: DC
  Administered 2017-09-29: 75 mg via ORAL
  Filled 2017-09-29: qty 1

## 2017-09-29 MED ORDER — DOXYCYCLINE HYCLATE 100 MG PO TABS
100.0000 mg | ORAL_TABLET | Freq: Two times a day (BID) | ORAL | Status: DC
  Administered 2017-09-29 (×3): 100 mg via ORAL
  Filled 2017-09-29 (×3): qty 1

## 2017-09-29 MED ORDER — ISOSORBIDE MONONITRATE ER 30 MG PO TB24
30.0000 mg | ORAL_TABLET | Freq: Every day | ORAL | Status: DC
  Administered 2017-09-29: 30 mg via ORAL
  Filled 2017-09-29: qty 1

## 2017-09-29 MED ORDER — PREDNISONE 20 MG PO TABS
40.0000 mg | ORAL_TABLET | Freq: Every day | ORAL | Status: DC
  Administered 2017-09-30: 40 mg via ORAL
  Filled 2017-09-29: qty 2

## 2017-09-29 MED ORDER — NEPRO/CARBSTEADY PO LIQD
237.0000 mL | Freq: Two times a day (BID) | ORAL | Status: DC
  Administered 2017-09-29: 237 mL via ORAL
  Filled 2017-09-29 (×5): qty 237

## 2017-09-29 MED ORDER — LIDOCAINE HCL (PF) 2 % IJ SOLN
INTRAMUSCULAR | Status: AC
  Filled 2017-09-29: qty 10

## 2017-09-29 MED ORDER — LORAZEPAM 2 MG/ML IJ SOLN: 1.0000 mg | Freq: Four times a day (QID) | INTRAMUSCULAR | Status: DC | PRN

## 2017-09-29 MED ORDER — NICOTINE 21 MG/24HR TD PT24
21.0000 mg | MEDICATED_PATCH | Freq: Every day | TRANSDERMAL | Status: DC
  Filled 2017-09-29: qty 1

## 2017-09-29 NOTE — H&P (Signed)
[name]:[id number] DOB: 03-Oct-1960 DOA: 09/28/2017     PCP: [name], MD   Outpatient Specialists:    [facility]   [facility]  Patient arrived to ER on 09/28/17 at 1733  Patient coming from:   home Lives alone,      Chief Complaint:  Chief Complaint  Patient presents with  . Shortness of Breath    HPI: [name] is a 57 y.o. male with medical history significant of ESRD on dialysis MWF,   suicidal ideation, polysubstance abuse, tobacco abuse, COPD, CAD, hep c and hep b, cirrhosis, anemia, rectal ulcer   Presented with shortness of breath finish dialysis session today.  Reports abdominal pain started today no associated nausea vomiting or diarrhea A similar admission in February Noted distended abdomen no associated chest pain recently been started on prednisone for possible COPD exacerbation has not started taking it yet reports some coughing and associated shortness of breath he continues to smoke did not try any breathing treatments.  Given worsening shortness of breath and abdominal pain he called EMS  Ports he continues to smoke and drinks last alcoholic drink was 2 days ago denies DTs states he does not drink as extensively as he used to Recent admission for Gi bleed Currently on Plavix his history of cardiac catheterization with DES stent placement In August 2018 with plan to continue Plavix for at least 6 months   While in ER: Noted to have significant abdominal distention father imaging showed ileus and ascites  Significant initial  Findings: Abnormal Labs Reviewed  COMPREHENSIVE METABOLIC PANEL - Abnormal; Notable for the following components:      Result Value   Sodium 132 (*)    Chloride 93 (*)    Glucose, Bld 116 (*)    Creatinine, Ser 5.12 (*)    ALT 12 (*)    Alkaline Phosphatase 141 (*)    GFR calc non Af Amer 12 (*)    GFR calc Af Amer 13 (*)    All other components within normal limits  CBC WITH  DIFFERENTIAL/PLATELET - Abnormal; Notable for the following components:   RBC 4.11 (*)    Hemoglobin 10.9 (*)    HCT 32.6 (*)    RDW 18.4 (*)    Platelets 148 (*)    Lymphs Abs 0.6 (*)    All other components within normal limits       K 4.6  Cr hemodialysis Lab Results  Component Value Date   CREATININE 5.12 (H) 09/28/2017   CREATININE 7.80 (H) 09/26/2017   CREATININE 6.29 (H) 09/03/2017   GFR: Estimated Creatinine Clearance: 16.3 mL/min (A) (by C-G formula based on SCr of 5.12 mg/dL (H)).  WBC 5.6  HG/HCT  stable,      Component Value Date/Time   HGB 10.9 (L) 09/28/2017 1950   HCT 32.6 (L) 09/28/2017 1950   INR 1.14  CT abdomen slight bowel prominence suggests ileus but no obstruction large volume intra-abdominal ascites  CXR -given vascular congestion and cardiomegaly  ECG:  Personally reviewed by me showing: HR : 108 Rhythm: Left ventricular hypertrophy  nonspecific changes,  [id number]     No data recorded. LABRCNTIP(cktotal:5,CKMB:5,ckmbindex:5,troponini:5) ED Triage Vitals  Enc Vitals Group     BP 09/28/17 1755 (!) 176/87     Pulse Rate 09/28/17 1755 (!) 110     Resp 09/28/17 1755 16     Temp --      Temp src --  SpO2 09/28/17 1755 100 %     Weight 09/28/17 1816 157 lb (71.2 kg)     Height 09/28/17 1816 5\' 9"  (1.753 m)     Head Circumference --      Peak Flow --      Pain Score 09/28/17 1814 10     Pain Loc --      Pain Edu? --      Excl. in [place]? --   TMAX(24)@     on arrival  ED Triage Vitals  Enc Vitals Group     BP 09/28/17 1755 (!) 176/87     Pulse Rate 09/28/17 1755 (!) 110     Resp 09/28/17 1755 16     Temp --      Temp src --      SpO2 09/28/17 1755 100 %     Weight 09/28/17 1816 157 lb (71.2 kg)     Height 09/28/17 1816 5\' 9"  (1.753 m)     Head Circumference --      Peak Flow --      Pain Score 09/28/17 1814 10     Pain Loc --      Pain Edu? --      Excl. in [place]? --      Latest  Blood pressure (!) 115/43, pulse (!)  108, resp. rate 16, height 5\' 9"  (1.753 m), weight 71.2 kg (157 lb), SpO2 100 %.   Following Medications were ordered in ER: Medications  Barium Sulfate 2.1 % SUSP (not administered)  HYDROmorphone (DILAUDID) injection 0.5 mg (0.5 mg Intravenous Given 09/29/17 0007)  ondansetron (ZOFRAN) injection 4 mg (4 mg Intravenous Given 09/28/17 1958)  ipratropium-albuterol (DUONEB) 0.5-2.5 (3) MG/3ML nebulizer solution 3 mL (3 mLs Nebulization Given 09/28/17 1958)  methylPREDNISolone sodium succinate (SOLU-MEDROL) 125 mg/2 mL injection 125 mg (125 mg Intravenous Given 09/28/17 1958)      Hospitalist was called for admission for   ileus significant ascites and probable of COPD exacerbation    Review of Systems:    Pertinent positives include: abdominal pain, shortness of breath at rest.  dyspnea on exertion,  Constitutional:  No weight loss, night sweats, Fevers, chills, fatigue, weight loss  HEENT:  No headaches, Difficulty swallowing,Tooth/dental problems,Sore throat,  No sneezing, itching, ear ache, nasal congestion, post nasal drip,  Cardio-vascular:  No chest pain, Orthopnea, PND, anasarca, dizziness, palpitations.no Bilateral lower extremity swelling  GI:  No heartburn, indigestion, nausea, vomiting, diarrhea, change in bowel habits, loss of appetite, melena, blood in stool, hematemesis Resp:  no  No excess mucus, no productive cough, No non-productive cough, No coughing up of blood.No change in color of mucus.No wheezing. Skin:  no rash or lesions. No jaundice GU:  no dysuria, change in color of urine, no urgency or frequency. No straining to urinate.  No flank pain.  Musculoskeletal:  No joint pain or no joint swelling. No decreased range of motion. No back pain.  Psych:  No change in mood or affect. No depression or anxiety. No memory loss.  Neuro: no localizing neurological complaints, no tingling, no weakness, no double vision, no gait abnormality, no slurred speech, no  confusion  As per HPI otherwise 10 point review of systems negative.   Past Medical History: Blood pressure (!) 115/43, pulse (!) 108, resp. rate 16, height 5\' 9"  (1.753 m), weight 71.2 kg (157 lb), SpO2 100 %.EDMEDS Past Medical History:  Diagnosis Date  . Adenomatous colon polyp    tubular  . Anemia   .  Anxiety   . Arthritis    left shoulder  . Atherosclerosis of aorta ([place])   . Cardiomegaly   . Chest pain    DATE UNKNOWN, C/O PERIODICALLY  . Cocaine abuse ([place])   . COPD exacerbation ([place]) 08/17/2016  . Coronary artery disease    stent 02/22/17  . Dialysis patient ([place])    Monday-Wednesday-Friday  . ESRD (end stage renal disease) on dialysis ([place])    "[name]; MWF" (07/04/2017)  . GERD (gastroesophageal reflux disease)    DATE UNKNOWN  . Hemorrhoids   . Hepatitis B, chronic ([place])   . Hepatitis C   . History of kidney stones   . Hyperkalemia   . Hypertension   . Metabolic bone disease    Patient denies  . Myocardial infarction ([place])   . Pneumonia   . Pulmonary edema   . Renal disorder   . Renal insufficiency   . Shortness of breath dyspnea    " for the last past year with this dialysis"  . Solitary rectal ulcer syndrome   . Tubular adenoma of colon      Social History:  Ambulatory   Independently    Past Surgical History:  Procedure Laterality Date  . A/V FISTULAGRAM Left 05/26/2017   Procedure: A/V FISTULAGRAM;  Surgeon: Conrad Thedford, MD;  Location: [place];  Service: Cardiovascular;  Laterality: Left;  . APPLICATION OF WOUND VAC Left 06/14/2017   Procedure: APPLICATION OF WOUND VAC;  Surgeon: Katha Cabal, MD;  Location: [facility];  Service: Vascular;  Laterality: Left;  . AV FISTULA PLACEMENT  2012   BELIEVED WAS PLACED IN JUNE  . AV FISTULA PLACEMENT Right 08/09/2017   Procedure: Creation Right arm ARTERIOVENOUS BRACHIOCEPOHALIC FISTULA;  Surgeon: Elam Dutch, MD;  Location: [place];  Service: Vascular;  Laterality: Right;  .  COLONOSCOPY    . CORONARY STENT INTERVENTION N/A 02/22/2017   Procedure: CORONARY STENT INTERVENTION;  Surgeon: Nigel Mormon, MD;  Location: [place] [facility];  Service: Cardiovascular;  Laterality: N/A;  . FLEXIBLE SIGMOIDOSCOPY N/A 07/15/2017   Procedure: FLEXIBLE SIGMOIDOSCOPY;  Surgeon: Carol Ada, MD;  Location: [place] [facility];  Service: Endoscopy;  Laterality: N/A;  . HEMORRHOID BANDING    . I&D EXTREMITY Left 06/01/2017   Procedure: IRRIGATION AND DEBRIDEMENT LEFT ARM HEMATOMA WITH LIGATION OF LEFT ARM AV FISTULA;  Surgeon: Elam Dutch, MD;  Location: [place];  Service: Vascular;  Laterality: Left;  . I&D EXTREMITY Left 06/14/2017   Procedure: IRRIGATION AND DEBRIDEMENT EXTREMITY;  Surgeon: Katha Cabal, MD;  Location: [facility];  Service: Vascular;  Laterality: Left;  . INSERTION OF DIALYSIS CATHETER  05/30/2017  . INSERTION OF DIALYSIS CATHETER N/A 05/30/2017   Procedure: INSERTION OF DIALYSIS CATHETER;  Surgeon: Elam Dutch, MD;  Location: [place];  Service: Vascular;  Laterality: N/A;  . IR PARACENTESIS  08/30/2017  . LEFT HEART CATH AND CORONARY ANGIOGRAPHY N/A 02/22/2017   Procedure: LEFT HEART CATH AND CORONARY ANGIOGRAPHY;  Surgeon: Nigel Mormon, MD;  Location: [place];  Service: Cardiovascular;  Laterality: N/A;  . LIGATION OF ARTERIOVENOUS  FISTULA Left 07/21/5091   Procedure: Plication of Left Arm Arteriovenous Fistula;  Surgeon: Elam Dutch, MD;  Location: [place];  Service: Vascular;  Laterality: Left;  . POLYPECTOMY    . REVISON OF ARTERIOVENOUS FISTULA Left 2/67/1245   Procedure: PLICATION OF DISTAL ANEURYSMAL SEGEMENT OF LEFT UPPER ARM ARTERIOVENOUS FISTULA;  Surgeon: Elam Dutch,  MD;  Location: [place];  Service: Vascular;  Laterality: Left;  . REVISON OF ARTERIOVENOUS FISTULA Left 8/52/7782   Procedure: Plication of Left Upper Arm Fistula ;  Surgeon: Waynetta Sandy, MD;  Location: [place];  Service: Vascular;  Laterality:  Left;  . SKIN GRAFT SPLIT THICKNESS LEG / FOOT Left    SKIN GRAFT SPLIT THICKNESS LEFT ARM DONOR SITE: LEFT ANTERIOR THIGH  . SKIN SPLIT GRAFT Left 07/04/2017   Procedure: SKIN GRAFT SPLIT THICKNESS LEFT ARM DONOR SITE: LEFT ANTERIOR THIGH;  Surgeon: Elam Dutch, MD;  Location: [place];  Service: Vascular;  Laterality: Left;  . THROMBECTOMY W/ EMBOLECTOMY Left 06/05/2017   Procedure: EXPLORATION OF LEFT ARM FOR BLEEDING; OVERSEWED PROXIMAL FISTULA;  Surgeon: Angelia Mould, MD;  Location: [place] [facility];  Service: Vascular;  Laterality: Left;  . WOUND EXPLORATION Left 06/03/2017   Procedure: WOUND EXPLORATION WITH WOUND VAC APPLICATION TO LEFT ARM;  Surgeon: Angelia Mould, MD;  Location: [place] [facility];  Service: Vascular;  Laterality: Left;    Allergies:   reports that he has been smoking cigarettes.  He started smoking about 44 years ago. He has a 21.50 pack-year smoking history. he has never used smokeless tobacco. He reports that he drinks alcohol. He reports that he uses drugs. Drugs: Marijuana and Cocaine.     Family History:   Allergies  Allergen Reactions  . Aspirin Other (See Comments)    STOMACH PAIN  . Clonidine Derivatives Itching  . Tramadol Itching  FAMHX Prior to Admission medications   Medication Sig Start Date End Date Taking? Authorizing Provider  albuterol (VENTOLIN HFA) 108 (90 Base) MCG/ACT inhaler Inhale 2 puffs into the lungs every 6 (six) hours as needed for wheezing or shortness of breath.   Yes [provider]  budesonide-formoterol (SYMBICORT) 80-4.5 MCG/ACT inhaler Inhale 2 puffs into the lungs 2 (two) times daily. 09/08/17  Yes Tanda Rockers, MD  clopidogrel (PLAVIX) 75 MG tablet Take 1 tablet (75 mg total) by mouth daily. 07/15/17  Yes Rhyne, Hulen Shouts, PA-C  diltiazem (CARDIZEM CD) 120 MG 24 hr capsule Take 1 capsule (120 mg total) by mouth daily. 06/23/17 06/23/18 Yes Shahmehdi, Seyed A, MD  gabapentin (NEURONTIN) 100 MG capsule Take 100 mg  by mouth 2 (two) times daily.   Yes [provider]  hydrALAZINE (APRESOLINE) 100 MG tablet Take 1 tablet by mouth twice a day take after HD on dialysis days 08/31/17  Yes [provider]  isosorbide mononitrate (IMDUR) 30 MG 24 hr tablet Take 1 tablet (30 mg total) by mouth daily. 06/17/17  Yes [name], MD  oxyCODONE (OXY IR/ROXICODONE) 5 MG immediate release tablet Take 5 mg by mouth 2 (two) times daily as needed. 09/22/17  Yes [provider]  Tiotropium Bromide Monohydrate (SPIRIVA RESPIMAT) 2.5 MCG/ACT AERS Inhale 2 puffs into the lungs daily. 09/08/17  Yes Tanda Rockers, MD  famotidine (PEPCID) 20 MG tablet One at bedtime Patient not taking: Reported on 09/26/2017 09/08/17   Tanda Rockers, MD  pantoprazole (PROTONIX) 40 MG tablet Take 1 tablet (40 mg total) by mouth daily. Take 30-60 min before first meal of the day Patient not taking: Reported on 09/26/2017 09/08/17   Tanda Rockers, MD    Physical Exam: Prior to Admission medications   Medication Sig Start Date End Date Taking? Authorizing Provider  albuterol (VENTOLIN HFA) 108 (90 Base) MCG/ACT inhaler Inhale 2 puffs into the lungs every 6 (six) hours as needed for  wheezing or shortness of breath.   Yes [provider]  budesonide-formoterol (SYMBICORT) 80-4.5 MCG/ACT inhaler Inhale 2 puffs into the lungs 2 (two) times daily. 09/08/17  Yes Tanda Rockers, MD  clopidogrel (PLAVIX) 75 MG tablet Take 1 tablet (75 mg total) by mouth daily. 07/15/17  Yes Rhyne, Hulen Shouts, PA-C  diltiazem (CARDIZEM CD) 120 MG 24 hr capsule Take 1 capsule (120 mg total) by mouth daily. 06/23/17 06/23/18 Yes Shahmehdi, Seyed A, MD  gabapentin (NEURONTIN) 100 MG capsule Take 100 mg by mouth 2 (two) times daily.   Yes [provider]  hydrALAZINE (APRESOLINE) 100 MG tablet Take 1 tablet by mouth twice a day take after HD on dialysis days 08/31/17  Yes [provider]  isosorbide mononitrate (IMDUR) 30 MG 24 hr  tablet Take 1 tablet (30 mg total) by mouth daily. 06/17/17  Yes [name], MD  oxyCODONE (OXY IR/ROXICODONE) 5 MG immediate release tablet Take 5 mg by mouth 2 (two) times daily as needed. 09/22/17  Yes [provider]  Tiotropium Bromide Monohydrate (SPIRIVA RESPIMAT) 2.5 MCG/ACT AERS Inhale 2 puffs into the lungs daily. 09/08/17  Yes Tanda Rockers, MD  famotidine (PEPCID) 20 MG tablet One at bedtime Patient not taking: Reported on 09/26/2017 09/08/17   Tanda Rockers, MD  pantoprazole (PROTONIX) 40 MG tablet Take 1 tablet (40 mg total) by mouth daily. Take 30-60 min before first meal of the day Patient not taking: Reported on 09/26/2017 09/08/17   Tanda Rockers, MD    1. General:  in No Acute distress  Chronically ill  -appearing 2. Psychological: Alert and   Oriented 3. Head/ENT:   Moist  Mucous Membranes                          Head Non traumatic, neck supple                           Poor Dentition 4. SKIN:   decreased Skin turgor,  Skin clean Dry and intact no rash 5. Heart: Regular rate and rhythm no  Murmur, no Rub or gallop 6. Lungs:   no wheezes or crackles   7. Abdomen: Soft,  non-tender,   distended  bowel sounds present 8. Lower extremities: no clubbing, cyanosis, or edema 9. Neurologically Grossly intact, moving all 4 extremities equally mild tremor 10. MSK: Normal range of motion   Patient Vitals for the past 24 hrs:  BP Pulse Resp SpO2 Height Weight  09/28/17 2245 (!) 115/43 (!) 108 16 100 % - -  09/28/17 2215 122/82 (!) 108 16 100 % - -  09/28/17 2130 (!) 120/94 (!) 109 (!) 21 100 % - -  09/28/17 2100 104/77 (!) 110 18 100 % - -  09/28/17 2030 110/80 (!) 114 18 100 % - -  09/28/17 2000 (!) 143/106 - (!) 27 - - -  09/28/17 1915 136/84 (!) 108 18 99 % - -  09/28/17 1816 - - - - 5\' 9"  (1.753 m) 71.2 kg (157 lb)  09/28/17 1803 - - - 100 % - -  09/28/17 1755 (!) 176/87 (!) 110 16 100 % - -  BMIP Recent Labs  Lab 09/26/17 2113 09/26/17 2127 09/28/17 1950   WBC 7.4  --  5.6  NEUTROABS 5.2  --  4.9  HGB 11.0* 11.9* 10.9*  HCT 32.9* 35.0* 32.6*  MCV 80.4  --  79.3  PLT 124*  --  151*   Basic Metabolic Panel: Recent Labs  Lab 09/26/17 2127 09/28/17 1950  NA 134* 132*  K 4.5 4.6  CL 94* 93*  CO2  --  26  GLUCOSE 103* 116*  BUN 26* 14  CREATININE 7.80* 5.12*  CALCIUM  --  9.3  LABRCNTIP(wbc:5,neutroabs:5,hgb:5,hct:5,mcv:5,plt:5) Recent Labs  Lab 09/26/17 2127 09/28/17 1950  NA 134* 132*  K 4.5 4.6  CL 94* 93*  CO2  --  26  GLUCOSE 103* 116*  BUN 26* 14  CREATININE 7.80* 5.12*  CALCIUM  --  9.3  LABRCNTIP(ast:5,ALT:5,alkphos:5,bilitot:5,prot:5,albumin:5) Recent Labs  Lab 09/28/17 1950  LIPASE 23   No results for input(s): AMMONIA in the last 168 hours.  BNP (last 3 results) No results for input(s): PROBNP in the last 8760 hours. HbA1C: No results for input(s): HGBA1C in the last 72 hours. CBG: No results for input(s): GLUCAP in the last 168 hours.    Urine analysis:    Component Value Date/Time   COLORURINE YELLOW 07/16/2011 1619   APPEARANCEUR CLEAR 07/16/2011 1619   LABSPEC 1.018 07/16/2011 1619   PHURINE 7.5 07/16/2011 1619   GLUCOSEU 250 (A) 07/16/2011 1619   HGBUR MODERATE (A) 07/16/2011 1619   BILIRUBINUR SMALL (A) 07/16/2011 1619   KETONESUR NEGATIVE 07/16/2011 1619   PROTEINUR >300 (A) 07/16/2011 1619   UROBILINOGEN 1.0 07/16/2011 1619   NITRITE NEGATIVE 07/16/2011 1619   LEUKOCYTESUR SMALL (A) 07/16/2011 1619   Sepsis Labs:  )No results found for this or any previous visit (from the past 240 hour(s)).    Lab Results  Component Value Date   HGBA1C 4.6 (L) 04/24/2017    Estimated Creatinine Clearance: 16.3 mL/min (A) (by C-G formula based on SCr of 5.12 mg/dL (H)).    Filed Weights   09/28/17 1816  Weight: 71.2 kg (157 lb)     Cultures:    Component Value Date/Time   SDES PERITONEAL 08/30/2017 1415   SDES PERITONEAL 08/30/2017 1415   SPECREQUEST NONE 08/30/2017 1415   SPECREQUEST  NONE 08/30/2017 1415   CULT  08/30/2017 1415    NO GROWTH 5 DAYS Performed at [facility], [place] [address], [place], [place] [address]    REPTSTATUS 08/30/2017 FINAL 08/30/2017 1415   REPTSTATUS 09/04/2017 FINAL 08/30/2017 1415     Radiological Exams on Admission: Ct Abdomen Pelvis Wo Contrast  Result Date: 09/28/2017 CLINICAL DATA:  History of ascites. Abdominal distension. Question small bowel obstruction on radiographs. EXAM: CT ABDOMEN AND PELVIS WITHOUT CONTRAST TECHNIQUE: Multidetector CT imaging of the abdomen and pelvis was performed following the standard protocol without IV contrast. COMPARISON:  Abdominal radiographs earlier this day.  CT 09/02/2017 FINDINGS: Lower chest: The heart is enlarged. Minimal pericardial thickening/fluid. Left basilar atelectasis. Hepatobiliary: Slight capsular nodularity which may represent cirrhosis. No discrete focal lesion. Gallbladder is not well visualized due to adjacent ascites. Pancreas: No ductal dilatation. Limited assessment for peripancreatic inflammation given adjacent ascites. Spleen: Normal in size without focal abnormality. Adrenals/Urinary Tract: Adrenal glands not as well visualized on the current exam in the absence of IV contrast. Atrophic kidneys consistent with chronic renal disease. Probable parenchymal calcification in the upper right kidney. Advanced renovascular calcifications. Urinary bladder is nondistended. Stomach/Bowel: Administered enteric contrast within the stomach and proximal small bowel. Bowel loops upper normal caliber measuring 2.9 cm but no definite bowel dilatation to suggest obstruction. More distal small bowel is nondistended. No colonic distension. Probable clip in the distal sigmoid colon. Vascular/Lymphatic: Advanced  aortic and branch atherosclerosis. Limited assessment for adenopathy given presence of ascites. Reproductive: Prostate is unremarkable. Other: Large volume intra-abdominopelvic ascites, progressed from  prior CT. Chronic hyperdensity dependently in the is situs in the right pelvis, unchanged dating back to at least 2018 and of doubtful clinical significance. No free air. Musculoskeletal: Sequela of renal osteodystrophy with degenerative disc disease L5-S1. There are no acute or suspicious osseous abnormalities. IMPRESSION: 1. Slight small bowel prominence suggesting ileus. No evidence of obstruction. 2. Large volume intra-abdominopelvic ascites, increased from CT 1 month ago. 3. Advanced Aortic Atherosclerosis (ICD10-I70.0). Electronically Signed   By: Jeb Levering M.D.   On: 09/28/2017 23:55   Dg Abd Acute W/chest  Result Date: 09/28/2017 CLINICAL DATA:  Shortness of breath, abdominal pain, distention and diarrhea for 3 days. EXAM: DG ABDOMEN ACUTE W/ 1V CHEST COMPARISON:  PA and lateral chest 09/26/2017. CT abdomen and pelvis 09/02/2017. FINDINGS: Single-view of the chest demonstrates enlarged cardiopericardial silhouette consistent with cardiomegaly and small pericardial effusion seen on prior CT. There is pulmonary vascular congestion. No consolidative process, pneumothorax or effusion. Aortic atherosclerosis is seen. Dialysis catheter is unchanged. Two views of the abdomen show no free intraperitoneal air. There is centralization of bowel loops and increased density of the abdomen consistent with ascites as seen on prior CT. Small bowel loops are mildly dilated up to 4.0 cm. IMPRESSION: Mild dilatation of small bowel loops could be due to ileus or obstruction. Findings consistent with ascites as seen on prior CT. Enlarged cardiopericardial silhouette consistent with cardiomegaly and small pericardial effusion as seen on prior CT. Pulmonary vascular congestion. Electronically Signed   By: Inge Rise M.D.   On: 09/28/2017 19:46    Chart has been reviewed    Assessment/Plan   57 y.o. male with medical history significant of ESRD on dialysis MWF,   suicidal ideation, polysubstance abuse,  tobacco abuse, COPD, CAD, hep c and hep b, cirrhosis, anemia, rectal ulcer     Admitted for   ileus significant ascites and probable of COPD exacerbation   Present on Admission:  The most likely multifactorial secondary to COPD exacerbation and abdominal distention given multiple risk factors we will cycle cardiac enzymes  . COPD exacerbation (HCC) mild  -  - Will initiate: Steroid taper  -  Antibiotics   Doxycycline, -   XopenexPRN, she had chronic tachycardia - scheduled duoneb,  -  Breo or Dulera at discharge   -  Mucinex.  Titrate O2 to saturation >90%. Follow patients respiratory status.   Currently mentating well no evidence of symptomatic hypercarbia  . Ascites -benefit from IR paracentesis ordered for tonight hold Plavix defer to IR if able to proceed . Alcohol abuse - CIWA protocol . Ileus ([place]) bowel rest, attempt to avoid narcotics if able . QT prolongation - will monitor on tele avoid QT prolonging medications, rehydrate correct electrolytes CAD -resume Plavix when able to move in 6 months since last stenting but would defer long-term to cardiology regarding overall length of management with Plavix continue statin . Other cirrhosis of liver (HCC) chronic likely secondary to combination of hepatitis B possibly  hepatitis C and alcohol abuse.  Patient states that his hepatitis C was cured.  Likely alcohol abuse being contributing factor . Essential hypertension resume home medications tomorrow patient has had hemodialysis today  . ESRD (end stage renal disease) ([place]) -last hemodialysis done today due on Friday if patient still hospitalized at that time will need to notify nephrology Other plan as per  orders.  DVT prophylaxis:  SCD   Code Status:  FULL CODE  as per patient   Family Communication:   Family not at  Bedside    Disposition Plan:   To home once workup is complete and patient is stable                              Consults called: none  Admission status:     inpatient       Level of care   tele            I have spent a total of 56 min on this admission  Maliik Karner 09/29/2017, 12:49 AM    [facility]  Pager [phone]   after 2 AM please page floor coverage PA If 7AM-7PM, please contact the day team taking care of the patient  [email or web address]  Password [email or web address]

## 2017-09-29 NOTE — Progress Notes (Signed)
EKG showing ST w/ PACs, pt also had 7 beat run of vtach @ 1405. Messaged attending MD

## 2017-09-29 NOTE — Progress Notes (Signed)
3rd troponin still elevated at 0.04. Relayed to attending MD

## 2017-09-29 NOTE — Progress Notes (Signed)
0900 troponin slightly elevated at 0.04. Messaged attending MD

## 2017-09-29 NOTE — Progress Notes (Signed)
Initial Nutrition Assessment  DOCUMENTATION CODES:   Non-severe (moderate) malnutrition in context of chronic illness  INTERVENTION:    Nepro Shake po BID, each supplement provides 425 kcal and 19 grams protein  NUTRITION DIAGNOSIS:   Moderate Malnutrition related to chronic illness(ESRD, COPD, cirrhosis) as evidenced by moderate fat depletion, moderate muscle depletion  GOAL:   Patient will meet greater than or equal to 90% of their needs  MONITOR:   PO intake, Supplement acceptance, Labs, I & O's, Weight trends  REASON FOR ASSESSMENT:   Consult COPD Protocol, Assessment of nutrition requirement/status  ASSESSMENT:   57 y.o. Male with PMH significant of ESRD on dialysis MWF, polysubstance abuse, tobacco abuse, COPD, CAD, hep c, cirrhosis, anemia, rectal ulcer. Presented to ED with SOB after his dialysis session.  RD spoke with pt at bedside. He reports a decreased appetite. Didn't like his breakfast. Says the bacon was like "leather". Pt consumes snacks during the day including cookies and cereal. He does not grocery shop.  Pt does not know if he's lost weight. He states "Mam, I don't keep up with that". He does like Nepro Carb Steady nutrition supplements (Butter Pecan flavor). Medications include folvite, MVI and thiamine.  Labs reviewed. Na 132 (L).  NUTRITION - FOCUSED PHYSICAL EXAM:    Most Recent Value  Orbital Region  No depletion  Upper Arm Region  Moderate depletion  Thoracic and Lumbar Region  Moderate depletion  Buccal Region  No depletion  Temple Region  Mild depletion  Clavicle Bone Region  Moderate depletion  Clavicle and Acromion Bone Region  Moderate depletion  Scapular Bone Region  Unable to assess  Dorsal Hand  Mild depletion  Patellar Region  Moderate depletion  Anterior Thigh Region  Moderate depletion  Posterior Calf Region  Moderate depletion  Edema (RD Assessment)  None    Diet Order:  Diet Heart Room service appropriate? Yes; Fluid  consistency: Thin; Fluid restriction: 1500 mL Fluid  EDUCATION NEEDS:   No education needs have been identified at this time  Skin:  Skin Assessment: Reviewed RN Assessment  Last BM:  3/20   Intake/Output Summary (Last 24 hours) at 09/29/2017 1150 Last data filed at 09/29/2017 1105 Gross per 24 hour  Intake 603 ml  Output -  Net 603 ml   Height:   Ht Readings from Last 1 Encounters:  09/29/17 5\' 9"  (1.753 m)   Weight:   Wt Readings from Last 1 Encounters:  09/29/17 151 lb 10.8 oz (68.8 kg)   BMI:  Body mass index is 22.4 kg/m.  Estimated Nutritional Needs:   Kcal:  2100-2300  Protein:  105-120 gm  Fluid:  1000 ml plus UOP  Arthur Holms, RD, LDN Pager #: [phone] After-Hours Pager #: [phone]

## 2017-09-29 NOTE — Progress Notes (Signed)
CRITICAL VALUE ALERT  Critical Value: Troponin 0.04  Date & Time Notied: 09/29/17 0422  Provider Notified: NP Schorr  Orders Received/Actions taken: No new orders recieved

## 2017-09-29 NOTE — Progress Notes (Signed)
Per IR, ok for pt to take Plavix for procedure. Also state pt does not need to be NPO for procedure. Messaged attending MD to relay

## 2017-09-29 NOTE — Progress Notes (Signed)
Patient seen and examined at bedside, patient admitted after midnight, please see earlier detailed admission note by Toy Baker, MD. Briefly, patient presented with dyspnea and abdominal swelling found to have mild COPD exacerbation and ascites secondary to known Cirrhosis. No abdominal pain. Recent heart stent placement in 02/2018. Currently on Plavix. Plan for IR to perform paracentesis. Will not hold Plavix in setting of recent stent and only on one antiplatelet.   Cordelia Poche, MD [facility] 09/29/2017, 7:28 AM Pager: [phone]

## 2017-09-29 NOTE — Progress Notes (Signed)
Upon review of chart, pt w/ 2 runs of vtach; 1203: 3 beats, 1237: 5 beats. Pt getting paracentesis at time. Attending MD notified

## 2017-09-29 NOTE — Progress Notes (Signed)
Pt not wearing identification band upon arrival to the unit, when asked about it pt states that they took it off and would not put on another one. Pt educated on the importance of the identification band in regards to pt safety and pt continues to refuse to wear the band. On arrival to the unit the pt's dressing for their HD catheter was peeling up at the edges, this nurse attempted to reinforce the dressing but the pt refused this as well. The pt was educated on the importance of the dressing for infection prevention, however the pt continued to refuse this as well.

## 2017-09-29 NOTE — Procedures (Signed)
PROCEDURE SUMMARY:  Successful US guided paracentesis from left lateral abdomen.  Yielded 1.6 liters of bloody fluid.  No immediate complications.  Pt tolerated well.   Specimen was sent for labs.  Docia Barrier PA-C 09/29/2017 2:14 PM

## 2017-09-30 DIAGNOSIS — R188 Other ascites: Principal | ICD-10-CM

## 2017-09-30 DIAGNOSIS — E440 Moderate protein-calorie malnutrition: Secondary | ICD-10-CM

## 2017-09-30 DIAGNOSIS — K567 Ileus, unspecified: Secondary | ICD-10-CM

## 2017-09-30 DIAGNOSIS — I10 Essential (primary) hypertension: Secondary | ICD-10-CM

## 2017-09-30 DIAGNOSIS — N186 End stage renal disease: Secondary | ICD-10-CM

## 2017-09-30 MED ORDER — GABAPENTIN 100 MG PO CAPS: 100.0000 mg | ORAL_CAPSULE | Freq: Every day | ORAL | Status: DC

## 2017-09-30 MED ORDER — PREDNISONE 20 MG PO TABS: 40.0000 mg | ORAL_TABLET | Freq: Every day | ORAL | 0 refills | Status: AC

## 2017-09-30 MED ORDER — DOXYCYCLINE HYCLATE 100 MG PO TABS: 100.0000 mg | ORAL_TABLET | Freq: Two times a day (BID) | ORAL | 0 refills | Status: AC

## 2017-09-30 MED ORDER — NEPRO/CARBSTEADY PO LIQD: 237.0000 mL | Freq: Two times a day (BID) | ORAL | 0 refills | Status: DC

## 2017-09-30 NOTE — Discharge Instructions (Signed)
Ascites °Ascites is a collection of excess fluid in the abdomen. Ascites can range from mild to severe. It can get worse without treatment. °What are the causes? °Possible causes include: °· Cirrhosis. This is the most common cause of ascites. °· Infection or inflammation in the abdomen. °· Cancer in the abdomen. °· Heart failure. °· Kidney disease. °· Inflammation of the pancreas. °· Clots in the veins of the liver. ° °What are the signs or symptoms? °Signs and symptoms may include: °· A feeling of fullness in your abdomen. This is common. °· An increase in the size of your abdomen or your waist. °· Swelling in your legs. °· Swelling of the scrotum in men. °· Difficulty breathing. °· Abdominal pain. °· Sudden weight gain. ° °If the condition is mild, you may not have symptoms. °How is this diagnosed? °To make a diagnosis, your health care provider will: °· Ask about your medical history. °· Perform a physical exam. °· Order imaging tests, such as an ultrasound or CT scan of your abdomen. ° °How is this treated? °Treatment depends on the cause of the ascites. It may include: °· Taking a pill to make you urinate. This is called a water pill (diuretic pill). °· Strictly reducing your salt (sodium) intake. Salt can cause extra fluid to be kept in the body, and this makes ascites worse. °· Having a procedure to remove fluid from your abdomen (paracentesis). °· Having a procedure to transfer fluid from your abdomen into a vein. °· Having a procedure that connects two of the major veins within your liver and relieves pressure on your liver (TIPS procedure). ° °Ascites may go away or improve with treatment of the condition that caused it. °Follow these instructions at home: °· Keep track of your weight. To do this, weigh yourself at the same time every day and record your weight. °· Keep track of how much you drink and any changes in the amount you urinate. °· Follow any instructions that your health care provider gives  you about how much to drink. °· Try not to eat salty (high-sodium) foods. °· Take medicines only as directed by your health care provider. °· Keep all follow-up visits as directed by your health care provider. This is important. °· Report any changes in your health to your health care provider, especially if you develop new symptoms or your symptoms get worse. °Contact a health care provider if: °· Your gain more than 3 pounds in 3 days. °· Your abdominal size or your waist size increases. °· You have new swelling in your legs. °· The swelling in your legs gets worse. °Get help right away if: °· You develop a fever. °· You develop confusion. °· You develop new or worsening difficulty breathing. °· You develop new or worsening abdominal pain. °· You develop new or worsening swelling in the scrotum (in men). °This information is not intended to replace advice given to you by your health care provider. Make sure you discuss any questions you have with your health care provider. °Document Released: 06/28/2005 Document Revised: 11/05/2015 Document Reviewed: 01/25/2014 °[employer] Interactive Patient Education © 2018 [employer] ° °

## 2017-09-30 NOTE — Discharge Summary (Signed)
Physician Discharge Summary  [name] IHK:[id number] DOB: May 22, 1961 DOA: 09/28/2017  PCP: [name], MD  Admit date: 09/28/2017 Discharge date: 09/30/2017  Admitted From: Home Disposition: Home  Recommendations for Outpatient Follow-up:  1. Follow up with PCP in 1 week 2. Please obtain BMP/CBC in one week 3. Would recommend cardiology follow-up referral 4. Please follow up on the following pending results: Body fluid culture  Home Health: None Equipment/Devices: None  Discharge Condition: Stable CODE STATUS: Full code Diet recommendation: Heart healthy/fluid restricted   Brief/Interim Summary:  Admission HPI written by Toy Baker, MD   HPI: [name] is a 57 y.o. male with medical history significant of ESRD on dialysis MWF,   suicidal ideation, polysubstance abuse, tobacco abuse, COPD, CAD, hep c and hep b, cirrhosis, anemia, rectal ulcer   Presented with shortness of breath finish dialysis session today.  Reports abdominal pain started today no associated nausea vomiting or diarrhea A similar admission in February Noted distended abdomen no associated chest pain recently been started on prednisone for possible COPD exacerbation has not started taking it yet reports some coughing and associated shortness of breath he continues to smoke did not try any breathing treatments.  Given worsening shortness of breath and abdominal pain he called EMS  Ports he continues to smoke and drinks last alcoholic drink was 2 days ago denies DTs states he does not drink as extensively as he used to Recent admission for Gi bleed Currently on Plavix his history of cardiac catheterization with DES stent placement In August 2018 with plan to continue Plavix for at least 6 months   While in ER: Noted to have significant abdominal distention father imaging showed ileus and ascites    Hospital course:  COPD exacerbation Improved with steroids and  nebulizer treatments. Started on doxycycline as well. Wheezing resolved. Discharge on prednisone burst and doxycycline.  Ascites Paracentesis performed. Patient on Plavix. 1.6 liters out. Bloody with no organisms. Culture without growth to date.  Alcohol abuse CIWA. No withdrawal.  Ileus Seen on x-ray. Patient with multiple bowel movements prior to discharge.  QT prolongation Chronic.  ESRD Outpatient HD  Essential hypertension Resume home medications  CAD S/p stent. Continued Plavix.  Discharge Diagnoses:  Active Problems:   Alcohol abuse   COPD exacerbation (HCC)   Essential hypertension   Ascites   ESRD (end stage renal disease) ([place])   Other cirrhosis of liver (HCC)   Ileus (HCC)   QT prolongation   Malnutrition of moderate degree    Discharge Instructions  Discharge Instructions    Call MD for:  persistant dizziness or light-headedness   Complete by:  As directed    Call MD for:  redness, tenderness, or signs of infection (pain, swelling, redness, odor or green/yellow discharge around incision site)   Complete by:  As directed    Call MD for:  temperature >100.4   Complete by:  As directed      Allergies as of 09/30/2017      Reactions   Aspirin Other (See Comments)   STOMACH PAIN   Clonidine Derivatives Itching   Tramadol Itching      Medication List    TAKE these medications   budesonide-formoterol 80-4.5 MCG/ACT inhaler Commonly known as:  SYMBICORT Inhale 2 puffs into the lungs 2 (two) times daily.   clopidogrel 75 MG tablet Commonly known as:  PLAVIX Take 1 tablet (75 mg total) by mouth daily.   diltiazem 120 MG 24 hr capsule Commonly  known as:  CARDIZEM CD Take 1 capsule (120 mg total) by mouth daily.   doxycycline 100 MG tablet Commonly known as:  VIBRA-TABS Take 1 tablet (100 mg total) by mouth every 12 (twelve) hours for 4 days.   famotidine 20 MG tablet Commonly known as:  PEPCID One at bedtime   feeding supplement (NEPRO  CARB STEADY) Liqd Take 237 mLs by mouth 2 (two) times daily between meals.   gabapentin 100 MG capsule Commonly known as:  NEURONTIN Take 1 capsule (100 mg total) by mouth at bedtime. What changed:  when to take this   hydrALAZINE 100 MG tablet Commonly known as:  APRESOLINE Take 1 tablet by mouth twice a day take after HD on dialysis days   isosorbide mononitrate 30 MG 24 hr tablet Commonly known as:  IMDUR Take 1 tablet (30 mg total) by mouth daily.   oxyCODONE 5 MG immediate release tablet Commonly known as:  Oxy IR/ROXICODONE Take 5 mg by mouth 2 (two) times daily as needed.   pantoprazole 40 MG tablet Commonly known as:  PROTONIX Take 1 tablet (40 mg total) by mouth daily. Take 30-60 min before first meal of the day   predniSONE 20 MG tablet Commonly known as:  DELTASONE Take 2 tablets (40 mg total) by mouth daily with breakfast for 3 days. Start taking on:  10/01/2017   Tiotropium Bromide Monohydrate 2.5 MCG/ACT Aers Commonly known as:  SPIRIVA RESPIMAT Inhale 2 puffs into the lungs daily.   VENTOLIN HFA 108 (90 Base) MCG/ACT inhaler Generic drug:  albuterol Inhale 2 puffs into the lungs every 6 (six) hours as needed for wheezing or shortness of breath.      Follow-up Information    Osei-Bonsu, [name], MD. Schedule an appointment as soon as possible for a visit in 1 week(s).   Specialty:  Internal Medicine Contact information: [address] [place] [address] [phone]          Allergies  Allergen Reactions  . Aspirin Other (See Comments)    STOMACH PAIN  . Clonidine Derivatives Itching  . Tramadol Itching    Consultations:  Interventional radiology   Procedures/Studies: Ct Abdomen Pelvis Wo Contrast  Result Date: 09/28/2017 CLINICAL DATA:  History of ascites. Abdominal distension. Question small bowel obstruction on radiographs. EXAM: CT ABDOMEN AND PELVIS WITHOUT CONTRAST TECHNIQUE: Multidetector CT imaging of the abdomen  and pelvis was performed following the standard protocol without IV contrast. COMPARISON:  Abdominal radiographs earlier this day.  CT 09/02/2017 FINDINGS: Lower chest: The heart is enlarged. Minimal pericardial thickening/fluid. Left basilar atelectasis. Hepatobiliary: Slight capsular nodularity which may represent cirrhosis. No discrete focal lesion. Gallbladder is not well visualized due to adjacent ascites. Pancreas: No ductal dilatation. Limited assessment for peripancreatic inflammation given adjacent ascites. Spleen: Normal in size without focal abnormality. Adrenals/Urinary Tract: Adrenal glands not as well visualized on the current exam in the absence of IV contrast. Atrophic kidneys consistent with chronic renal disease. Probable parenchymal calcification in the upper right kidney. Advanced renovascular calcifications. Urinary bladder is nondistended. Stomach/Bowel: Administered enteric contrast within the stomach and proximal small bowel. Bowel loops upper normal caliber measuring 2.9 cm but no definite bowel dilatation to suggest obstruction. More distal small bowel is nondistended. No colonic distension. Probable clip in the distal sigmoid colon. Vascular/Lymphatic: Advanced aortic and branch atherosclerosis. Limited assessment for adenopathy given presence of ascites. Reproductive: Prostate is unremarkable. Other: Large volume intra-abdominopelvic ascites, progressed from prior CT. Chronic hyperdensity dependently in the  is situs in the right pelvis, unchanged dating back to at least 2018 and of doubtful clinical significance. No free air. Musculoskeletal: Sequela of renal osteodystrophy with degenerative disc disease L5-S1. There are no acute or suspicious osseous abnormalities. IMPRESSION: 1. Slight small bowel prominence suggesting ileus. No evidence of obstruction. 2. Large volume intra-abdominopelvic ascites, increased from CT 1 month ago. 3. Advanced Aortic Atherosclerosis (ICD10-I70.0).  Electronically Signed   By: Jeb Levering M.D.   On: 09/28/2017 23:55   Dg Chest 2 View  Result Date: 09/26/2017 CLINICAL DATA:  Short of breath EXAM: CHEST - 2 VIEW COMPARISON:  09/02/2017 FINDINGS: Right-sided central venous catheter tip overlies the right atrium. Aortic atherosclerosis. Carotid vascular calcifications. Axillary vascular. Cardiomegaly with elevation of left diaphragm as before. No large effusion. No focal consolidation. Vascular congestion. No pneumothorax. IMPRESSION: 1. Cardiomegaly with mild vascular congestion 2. No pleural effusion or focal pulmonary infiltrate. Electronically Signed   By: Donavan Foil M.D.   On: 09/26/2017 21:36   Dg Chest 2 View  Result Date: 09/02/2017 CLINICAL DATA:  Shortness of breath. Renal failure, receiving dialysis. EXAM: CHEST  2 VIEW COMPARISON:  August 29, 2017 FINDINGS: Central catheter tip is in the right atrium. No pneumothorax. There is no edema or consolidation. There is generalized cardiomegaly with pulmonary vascularity normal. No adenopathy. There is aortic atherosclerosis. No evident bone lesions. IMPRESSION: Cardiomegaly. Superimposed pericardial effusion cannot be excluded. No edema or consolidation. Pulmonary vascularity normal. Aortic atherosclerosis. Central catheter tip in right atrium.  No pneumothorax. Aortic Atherosclerosis (ICD10-I70.0). Electronically Signed   By: Lowella Grip III M.D.   On: 09/02/2017 12:15   Ct Abdomen Pelvis W Contrast  Result Date: 09/02/2017 CLINICAL DATA:  Abdominal pain for 3 days. EXAM: CT ABDOMEN AND PELVIS WITH CONTRAST TECHNIQUE: Multidetector CT imaging of the abdomen and pelvis was performed using the standard protocol following bolus administration of intravenous contrast. CONTRAST:  146mL ISOVUE-300 IOPAMIDOL (ISOVUE-300) INJECTION 61% COMPARISON:  07/15/2017 FINDINGS: Lower chest: New trace pleural effusions bilaterally. Subsegmental atelectasis in both lung bases. Cardiomegaly and  coronary artery atherosclerosis with similar appearance of a small pericardial effusion. Dialysis catheter in the right atrium. Hepatobiliary: No focal liver abnormality is seen. No gallstones or biliary dilatation. Pancreas: Unremarkable. Spleen: Unremarkable. Adrenals/Urinary Tract: Unremarkable adrenal glands. Similar appearance of marked bilateral renal atrophy and small bilateral low-density renal lesions. No excreted contrast on delayed imaging. No hydronephrosis. Unremarkable bladder. Stomach/Bowel: The stomach is within normal limits. Fluid-filled nondilated small bowel loops in the lower abdomen and pelvis. No evidence of bowel obstruction or gross bowel wall thickening. A hemostatic clip is present in the rectum. Vascular/Lymphatic: Extensive atherosclerotic calcification of the abdominal aorta and its major branch vessels. No abdominal aortic aneurysm. No enlarged lymph nodes. Reproductive: Unremarkable prostate. Other: Large volume ascites, similar to prior. No pneumoperitoneum. No abdominal wall hernia. Musculoskeletal: Diffusely increased osseous density likely reflecting renal osteodystrophy. Focally advanced disc degeneration at L5-S1. IMPRESSION: 1. Large volume ascites, similar to prior. 2. New trace bilateral pleural effusions and bibasilar subsegmental atelectasis. 3. Small pericardial effusion. 4.  Aortic Atherosclerosis (ICD10-I70.0). Electronically Signed   By: [name] Bores M.D.   On: 09/02/2017 16:36   Dg Abd Acute W/chest  Result Date: 09/28/2017 CLINICAL DATA:  Shortness of breath, abdominal pain, distention and diarrhea for 3 days. EXAM: DG ABDOMEN ACUTE W/ 1V CHEST COMPARISON:  PA and lateral chest 09/26/2017. CT abdomen and pelvis 09/02/2017. FINDINGS: Single-view of the chest demonstrates enlarged cardiopericardial silhouette consistent with cardiomegaly and  small pericardial effusion seen on prior CT. There is pulmonary vascular congestion. No consolidative process, pneumothorax or  effusion. Aortic atherosclerosis is seen. Dialysis catheter is unchanged. Two views of the abdomen show no free intraperitoneal air. There is centralization of bowel loops and increased density of the abdomen consistent with ascites as seen on prior CT. Small bowel loops are mildly dilated up to 4.0 cm. IMPRESSION: Mild dilatation of small bowel loops could be due to ileus or obstruction. Findings consistent with ascites as seen on prior CT. Enlarged cardiopericardial silhouette consistent with cardiomegaly and small pericardial effusion as seen on prior CT. Pulmonary vascular congestion. Electronically Signed   By: Inge Rise M.D.   On: 09/28/2017 19:46   Ir Paracentesis  Result Date: 09/29/2017 INDICATION: Patient with recurrent ascites. Request is made for diagnostic and therapeutic paracentesis. EXAM: ULTRASOUND GUIDED DIAGNOSTIC AND THERAPEUTIC PARACENTESIS MEDICATIONS: 10 mL 1% lidocaine COMPLICATIONS: None immediate. PROCEDURE: Informed written consent was obtained from the patient after a discussion of the risks, benefits and alternatives to treatment. A timeout was performed prior to the initiation of the procedure. Initial ultrasound scanning demonstrates a small amount of ascites within the left lateral abdomen. The left lateral abdomen was prepped and draped in the usual sterile fashion. 1% lidocaine was used for local anesthesia. Following this, a 19 gauge, 7-cm, Yueh catheter was introduced. An ultrasound image was saved for documentation purposes. The paracentesis was performed. The catheter was removed and a dressing was applied. The patient tolerated the procedure well without immediate post procedural complication. FINDINGS: A total of approximately 1.6 liters of bloody fluid was removed. Samples were sent to the laboratory as requested by the clinical team. IMPRESSION: Successful ultrasound-guided diagnostic and therapeutic paracentesis yielding 1.6 liters of peritoneal fluid. Read by:  Brynda Greathouse PA-C Electronically Signed   By: Lucrezia Europe M.D.   On: 09/29/2017 14:44       Subjective: No issues overnight. No abdominal pain. No dyspnea.  Discharge Exam: Vitals:   09/29/17 2337 09/30/17 0548  BP: 128/65 131/75  Pulse: (!) 110 (!) 102  Resp: 18 18  Temp: 98.1 F (36.7 C) 97.9 F (36.6 C)  SpO2: 96% 100%   Vitals:   09/29/17 1613 09/29/17 1950 09/29/17 2337 09/30/17 0548  BP:   128/65 131/75  Pulse:  (!) 108 (!) 110 (!) 102  Resp:  18 18 18   Temp:   98.1 F (36.7 C) 97.9 F (36.6 C)  TempSrc:   Oral Oral  SpO2: 96% 97% 96% 100%  Weight:      Height:        General: Pt is alert, awake, not in acute distress    The results of significant diagnostics from this hospitalization (including imaging, microbiology, ancillary and laboratory) are listed below for reference.     Microbiology: Recent Results (from the past 240 hour(s))  Gram stain     Status: None   Collection Time: 09/29/17 12:46 PM  Result Value Ref Range Status   Specimen Description PERITONEAL  Final   Special Requests NONE  Final   Gram Stain   Final    RARE WBC PRESENT, PREDOMINANTLY PMN NO ORGANISMS SEEN Performed at [facility], [address], [place], [place] [address]    Report Status 09/29/2017 FINAL  Final     Labs: BNP (last 3 results) Recent Labs    07/08/17 1959 08/20/17 1455 08/29/17 1235  BNP 1,100.4* 1,418.4* 657.8*   Basic Metabolic Panel: Recent Labs  Lab 09/26/17 2127 09/28/17 1950 09/29/17 0307 09/29/17 1445  NA 134* 132* 132*  --   K 4.5 4.6 5.0 4.9  CL 94* 93* 92*  --   CO2  --  26 27  --   GLUCOSE 103* 116* 146*  --   BUN 26* 14 16  --   CREATININE 7.80* 5.12* 5.69*  --   CALCIUM  --  9.3 9.5  --   MG  --   --  2.4 2.4  PHOS  --   --  4.3  --    Liver Function Tests: Recent Labs  Lab 09/28/17 1950 09/29/17 0307  AST 21 21  ALT 12* 12*  ALKPHOS 141* 138*  BILITOT 0.8 0.7  PROT 7.1 7.4  ALBUMIN 3.7 3.8   Recent Labs   Lab 09/28/17 1950  LIPASE 23   No results for input(s): AMMONIA in the last 168 hours. CBC: Recent Labs  Lab 09/26/17 2113 09/26/17 2127 09/28/17 1950 09/29/17 0307  WBC 7.4  --  5.6 4.8  NEUTROABS 5.2  --  4.9  --   HGB 11.0* 11.9* 10.9* 11.5*  HCT 32.9* 35.0* 32.6* 34.3*  MCV 80.4  --  79.3 79.2  PLT 124*  --  148* 151   Cardiac Enzymes: Recent Labs  Lab 09/29/17 0307 09/29/17 0845 09/29/17 1445  TROPONINI 0.04* 0.04* 0.04*   BNP: Invalid input(s): POCBNP CBG: No results for input(s): GLUCAP in the last 168 hours. D-Dimer No results for input(s): DDIMER in the last 72 hours. Hgb A1c No results for input(s): HGBA1C in the last 72 hours. Lipid Profile No results for input(s): CHOL, HDL, LDLCALC, TRIG, CHOLHDL, LDLDIRECT in the last 72 hours. Thyroid function studies Recent Labs    09/29/17 0307  TSH 0.436   Anemia work up No results for input(s): VITAMINB12, FOLATE, FERRITIN, TIBC, IRON, RETICCTPCT in the last 72 hours. Urinalysis    Component Value Date/Time   COLORURINE YELLOW 07/16/2011 1619   APPEARANCEUR CLEAR 07/16/2011 1619   LABSPEC 1.018 07/16/2011 1619   PHURINE 7.5 07/16/2011 1619   GLUCOSEU 250 (A) 07/16/2011 1619   HGBUR MODERATE (A) 07/16/2011 1619   BILIRUBINUR SMALL (A) 07/16/2011 1619   KETONESUR NEGATIVE 07/16/2011 1619   PROTEINUR >300 (A) 07/16/2011 1619   UROBILINOGEN 1.0 07/16/2011 1619   NITRITE NEGATIVE 07/16/2011 1619   LEUKOCYTESUR SMALL (A) 07/16/2011 1619   Sepsis Labs Invalid input(s): PROCALCITONIN,  WBC,  LACTICIDVEN Microbiology Recent Results (from the past 240 hour(s))  Gram stain     Status: None   Collection Time: 09/29/17 12:46 PM  Result Value Ref Range Status   Specimen Description PERITONEAL  Final   Special Requests NONE  Final   Gram Stain   Final    RARE WBC PRESENT, PREDOMINANTLY PMN NO ORGANISMS SEEN Performed at [facility], [place] [address], [place], [place] [address]    Report Status  09/29/2017 FINAL  Final     SIGNED:   Cordelia Poche, MD [facility] 09/30/2017, 7:26 AM Pager [phone]  If 7PM-7AM, please contact night-coverage [email or web address] Password [email or web address]

## 2017-09-30 NOTE — Plan of Care (Signed)
  Problem: Activity: Goal: Risk for activity intolerance will decrease Outcome: Progressing Note:  Pt does well ambulating to the bathroom independently. VSS and pt denies complaints or symptoms when ambulating.

## 2017-09-30 NOTE — Progress Notes (Deleted)
  HD orders: [place] MWF 4h   70kg  400/800  2/2 bath  Hep none R IJ TDC/ maturing AVF

## 2017-10-03 ENCOUNTER — Emergency Department (HOSPITAL_COMMUNITY)
Admission: EM | Admit: 2017-10-03 | Discharge: 2017-10-03 | Disposition: A | Discharge status: Home / Self Care | Payer: Medicare Other | Attending: Emergency Medicine | Admitting: Emergency Medicine

## 2017-10-03 ENCOUNTER — Encounter: Payer: Self-pay | Admitting: Internal Medicine

## 2017-10-03 ENCOUNTER — Emergency Department (HOSPITAL_COMMUNITY): Payer: Medicare Other

## 2017-10-03 ENCOUNTER — Encounter (HOSPITAL_COMMUNITY): Payer: Self-pay | Admitting: Emergency Medicine

## 2017-10-03 DIAGNOSIS — Z5321 Procedure and treatment not carried out due to patient leaving prior to being seen by health care provider: Secondary | ICD-10-CM | POA: Insufficient documentation

## 2017-10-03 DIAGNOSIS — N186 End stage renal disease: Secondary | ICD-10-CM | POA: Diagnosis not present

## 2017-10-03 DIAGNOSIS — N2581 Secondary hyperparathyroidism of renal origin: Secondary | ICD-10-CM | POA: Diagnosis not present

## 2017-10-03 DIAGNOSIS — R0602 Shortness of breath: Secondary | ICD-10-CM | POA: Insufficient documentation

## 2017-10-03 DIAGNOSIS — D631 Anemia in chronic kidney disease: Secondary | ICD-10-CM | POA: Diagnosis not present

## 2017-10-03 DIAGNOSIS — D509 Iron deficiency anemia, unspecified: Secondary | ICD-10-CM | POA: Diagnosis not present

## 2017-10-03 LAB — CBC WITH DIFFERENTIAL/PLATELET
Basophils Absolute: 0 10*3/uL (ref 0.0–0.1)
Basophils Relative: 0 %
Eosinophils Absolute: 0 10*3/uL (ref 0.0–0.7)
Eosinophils Relative: 0 %
HCT: 34.7 % — ABNORMAL LOW (ref 39.0–52.0)
Hemoglobin: 11.6 g/dL — ABNORMAL LOW (ref 13.0–17.0)
Lymphocytes Relative: 15 %
Lymphs Abs: 1.6 10*3/uL (ref 0.7–4.0)
MCH: 26.4 pg (ref 26.0–34.0)
MCHC: 33.4 g/dL (ref 30.0–36.0)
MCV: 78.9 fL (ref 78.0–100.0)
Monocytes Absolute: 0.9 10*3/uL (ref 0.1–1.0)
Monocytes Relative: 8 %
Neutro Abs: 8.1 10*3/uL — ABNORMAL HIGH (ref 1.7–7.7)
Neutrophils Relative %: 77 %
Platelets: 227 10*3/uL (ref 150–400)
RBC: 4.4 MIL/uL (ref 4.22–5.81)
RDW: 19 % — ABNORMAL HIGH (ref 11.5–15.5)
WBC: 10.6 10*3/uL — ABNORMAL HIGH (ref 4.0–10.5)

## 2017-10-03 LAB — COMPREHENSIVE METABOLIC PANEL
ALT: 62 U/L (ref 17–63)
AST: 52 U/L — ABNORMAL HIGH (ref 15–41)
Albumin: 4 g/dL (ref 3.5–5.0)
Alkaline Phosphatase: 162 U/L — ABNORMAL HIGH (ref 38–126)
Anion gap: 15 (ref 5–15)
BUN: 34 mg/dL — ABNORMAL HIGH (ref 6–20)
CO2: 27 mmol/L (ref 22–32)
Calcium: 9.5 mg/dL (ref 8.9–10.3)
Chloride: 95 mmol/L — ABNORMAL LOW (ref 101–111)
Creatinine, Ser: 5.73 mg/dL — ABNORMAL HIGH (ref 0.61–1.24)
GFR calc Af Amer: 12 mL/min — ABNORMAL LOW (ref >60)
GFR calc non Af Amer: 10 mL/min — ABNORMAL LOW (ref >60)
Glucose, Bld: 144 mg/dL — ABNORMAL HIGH (ref 65–99)
Potassium: 4.1 mmol/L (ref 3.5–5.1)
Sodium: 137 mmol/L (ref 135–145)
Total Bilirubin: 0.7 mg/dL (ref 0.3–1.2)
Total Protein: 7.4 g/dL (ref 6.5–8.1)

## 2017-10-03 NOTE — ED Notes (Signed)
Called for room, no answer

## 2017-10-03 NOTE — ED Triage Notes (Signed)
Pt to ER for evaluation of shortness of breath. States onset this morning after dialysis. VSS. Pt asking for water in triage. In NAD.

## 2017-10-04 LAB — CULTURE, BODY FLUID W GRAM STAIN -BOTTLE: Culture: NO GROWTH

## 2017-10-05 DIAGNOSIS — D631 Anemia in chronic kidney disease: Secondary | ICD-10-CM | POA: Diagnosis not present

## 2017-10-05 DIAGNOSIS — N186 End stage renal disease: Secondary | ICD-10-CM | POA: Diagnosis not present

## 2017-10-05 DIAGNOSIS — I4891 Unspecified atrial fibrillation: Secondary | ICD-10-CM | POA: Diagnosis not present

## 2017-10-05 DIAGNOSIS — N2581 Secondary hyperparathyroidism of renal origin: Secondary | ICD-10-CM | POA: Diagnosis not present

## 2017-10-05 DIAGNOSIS — D509 Iron deficiency anemia, unspecified: Secondary | ICD-10-CM | POA: Diagnosis not present

## 2017-10-07 DIAGNOSIS — D631 Anemia in chronic kidney disease: Secondary | ICD-10-CM | POA: Diagnosis not present

## 2017-10-07 DIAGNOSIS — N186 End stage renal disease: Secondary | ICD-10-CM | POA: Diagnosis not present

## 2017-10-07 DIAGNOSIS — D509 Iron deficiency anemia, unspecified: Secondary | ICD-10-CM | POA: Diagnosis not present

## 2017-10-07 DIAGNOSIS — N2581 Secondary hyperparathyroidism of renal origin: Secondary | ICD-10-CM | POA: Diagnosis not present

## 2017-10-08 ENCOUNTER — Emergency Department (HOSPITAL_COMMUNITY)
Admission: EM | Admit: 2017-10-08 | Discharge: 2017-10-09 | Disposition: A | Discharge status: Home / Self Care | Payer: Medicare Other | Attending: Emergency Medicine | Admitting: Emergency Medicine

## 2017-10-08 ENCOUNTER — Encounter (HOSPITAL_COMMUNITY): Payer: Self-pay | Admitting: Emergency Medicine

## 2017-10-08 DIAGNOSIS — Z5321 Procedure and treatment not carried out due to patient leaving prior to being seen by health care provider: Secondary | ICD-10-CM | POA: Diagnosis not present

## 2017-10-08 DIAGNOSIS — R0902 Hypoxemia: Secondary | ICD-10-CM | POA: Diagnosis not present

## 2017-10-08 DIAGNOSIS — R0602 Shortness of breath: Secondary | ICD-10-CM | POA: Insufficient documentation

## 2017-10-08 NOTE — ED Triage Notes (Signed)
Pt to ER for evaluation of shortness of breath, states onset today. Denies chest pain.

## 2017-10-08 NOTE — ED Notes (Signed)
Last dialysis treatment yesterday.

## 2017-10-09 NOTE — ED Notes (Signed)
Pt. Has been called 3 times, no answer.

## 2017-10-09 NOTE — ED Notes (Signed)
Pt called for room x2 

## 2017-10-10 DIAGNOSIS — Z992 Dependence on renal dialysis: Secondary | ICD-10-CM | POA: Diagnosis not present

## 2017-10-10 DIAGNOSIS — A499 Bacterial infection, unspecified: Secondary | ICD-10-CM | POA: Diagnosis not present

## 2017-10-10 DIAGNOSIS — D509 Iron deficiency anemia, unspecified: Secondary | ICD-10-CM | POA: Diagnosis not present

## 2017-10-10 DIAGNOSIS — N186 End stage renal disease: Secondary | ICD-10-CM | POA: Diagnosis not present

## 2017-10-10 DIAGNOSIS — D631 Anemia in chronic kidney disease: Secondary | ICD-10-CM | POA: Diagnosis not present

## 2017-10-10 DIAGNOSIS — N2581 Secondary hyperparathyroidism of renal origin: Secondary | ICD-10-CM | POA: Diagnosis not present

## 2017-10-10 DIAGNOSIS — I120 Hypertensive chronic kidney disease with stage 5 chronic kidney disease or end stage renal disease: Secondary | ICD-10-CM | POA: Diagnosis not present

## 2017-10-12 DIAGNOSIS — D509 Iron deficiency anemia, unspecified: Secondary | ICD-10-CM | POA: Diagnosis not present

## 2017-10-12 DIAGNOSIS — N186 End stage renal disease: Secondary | ICD-10-CM | POA: Diagnosis not present

## 2017-10-12 DIAGNOSIS — A499 Bacterial infection, unspecified: Secondary | ICD-10-CM | POA: Diagnosis not present

## 2017-10-12 DIAGNOSIS — N2581 Secondary hyperparathyroidism of renal origin: Secondary | ICD-10-CM | POA: Diagnosis not present

## 2017-10-12 DIAGNOSIS — D631 Anemia in chronic kidney disease: Secondary | ICD-10-CM | POA: Diagnosis not present

## 2017-10-13 ENCOUNTER — Encounter: Payer: Self-pay | Admitting: Internal Medicine

## 2017-10-13 ENCOUNTER — Ambulatory Visit (INDEPENDENT_AMBULATORY_CARE_PROVIDER_SITE_OTHER): Payer: Medicare Other | Admitting: Internal Medicine

## 2017-10-13 DIAGNOSIS — R058 Other specified cough: Secondary | ICD-10-CM

## 2017-10-13 DIAGNOSIS — J986 Disorders of diaphragm: Secondary | ICD-10-CM | POA: Insufficient documentation

## 2017-10-13 DIAGNOSIS — J449 Chronic obstructive pulmonary disease, unspecified: Secondary | ICD-10-CM | POA: Diagnosis not present

## 2017-10-13 DIAGNOSIS — R0600 Dyspnea, unspecified: Secondary | ICD-10-CM

## 2017-10-13 DIAGNOSIS — R0609 Other forms of dyspnea: Secondary | ICD-10-CM

## 2017-10-13 DIAGNOSIS — R1084 Generalized abdominal pain: Secondary | ICD-10-CM | POA: Diagnosis not present

## 2017-10-13 DIAGNOSIS — R05 Cough: Secondary | ICD-10-CM

## 2017-10-13 DIAGNOSIS — F17210 Nicotine dependence, cigarettes, uncomplicated: Secondary | ICD-10-CM

## 2017-10-13 LAB — PULMONARY FUNCTION TEST
FEF 25-75 Pre: 1.27 L/sec
FEF2575-%Pred-Pre: 41 %
FEV1-%Pred-Pre: 64 %
FEV1-Pre: 2.02 L
FEV1FVC-%Pred-Pre: 88 %
FEV6-%Pred-Pre: 75 %
FEV6-Pre: 2.88 L
FEV6FVC-%Pred-Pre: 102 %
FVC-%Pred-Pre: 73 %
FVC-Pre: 2.9 L
Pre FEV1/FVC ratio: 70 %
Pre FEV6/FVC Ratio: 99 %

## 2017-10-13 MED ORDER — BUDESONIDE-FORMOTEROL FUMARATE 80-4.5 MCG/ACT IN AERO: 2.0000 | INHALATION_SPRAY | Freq: Two times a day (BID) | RESPIRATORY_TRACT | 1 refills | Status: DC

## 2017-10-13 NOTE — Patient Instructions (Addendum)
Plan A = Automatic = symbicort 80 Take 2 puffs first thing in am and then another 2 puffs about 12 hours later.   Work on inhaler technique:  relax and gently blow all the way out then take a nice smooth deep breath back in, triggering the inhaler at same time you start breathing in.  Hold for up to 5 seconds if you can. Blow out thru nose. Rinse and gargle with water when done     Plan B = Backup Only use your albuterol (Ventolin) as a rescue medication to be used if you can't catch your breath by resting or doing a relaxed purse lip breathing pattern.  - The less you use it, the better it will work when you need it. - Ok to use the inhaler up to 2 puffs  every 4 hours if you must but call for appointment if use goes up over your usual need - Don't leave home without it !!  (think of it like the spare tire for your car)   No other inhalers should be used without checking with this office    Please see patient coordinator before you leave today  to schedule sniff test    Please schedule a follow up office visit in 4 weeks, sooner if needed  with all medications /inhalers/ solutions in hand so we can verify exactly what you are taking. This includes all medications from all doctors and over the counters

## 2017-10-13 NOTE — Progress Notes (Signed)
Subjective:     Patient ID: [name], male   DOB: [birth year],    MRN: [id number]    Brief patient profile:  5 yobm active smoker on HD x 2011 MWF referred to pulmonary clinic 09/27/2016 by Dr   Vista Lawman for sob     History of Present Illness  09/27/2016 1st [facility] office visit/ Reha Martinovich   Chief Complaint  Patient presents with  . Pulmonary Consult    Referred by Dr. Vista Lawman for eval of COPD. Pt states that he has been to the ED x 3 for SOB that occurs at night when he lies down. He states this started back in 2017.    HD am of ov and does feel better in terms of breathing p HD but trips to er do not correlate with HD before vs after. In addition to having paroxyms of resting sob also has doe x MMRC2 = can't walk a nl pace on a flat grade s sob but does fine slow and flat rec Stop advair - if breathing no better next step is see Dr Vista Lawman for trial off lisinopril Plan A = Automatic = symbicort 160 Take 2 puffs first thing in am and then another 2 puffs about 12 hours later.  Work on inhaler technique:  relax and gently blow all the way out then take a nice smooth deep breath back in, triggering the inhaler at same time you start breathing in.  Hold for up to 5 seconds if you can. Blow out thru nose. Rinse and gargle with water when done Plan B = Backup Only use your albuterol (PROVENTIL/yellow)   See phone messages 12/30/16,  Advised to come in for re-eval with all meds in hand   01/04/2017  f/u ov/Dhilan Brauer re: sob/ no meds Chief Complaint  Patient presents with  . Follow-up    OV for oxygen, feels more SOB lately since the heat, wants to stop smoking  angry on arrival / uncooperative with interview and exam related to meds  rec  left prior to completing eval/ agree re confusion with meds      03/10/17 [facility] eval rec  I'm sending in a prescription for an albuterol inhaler (Ventolin) to use as needed when you're short of breath, coughing, or  wheezing.  Remember to use your Symbicort inhaler by doing 2 puffs twice daily - every day.  Use the spacer with your Symbicort inhaler.   Remember to remove any dentures or partials you have before you use your Symbicort. Remember to brush your teeth & tongue after you use your inhaler as well as rinse, gargle & spit to keep from getting thrush in your mouth or on your tongue (a white film).   Try using nicotine gum or lozenges for your intermittent cigarette cravings.   Contact me if you have any new breathing problems or questions before your next appointment.  TESTS ORDERED: 1. Full pulmonary function testing and follow-up appointment> did not do 2. 6 minute walk test on room air before next appointment> did not f/u 3. Serum alpha-1 antitrypsin phenotype today> MM   09/08/2017  Extended  ov/Jaid Quirion re:  Copd  Active smoker   Chief Complaint  Patient presents with  . Acute Visit    breathing progressively worse since last visit. He states he stays SOB "most of the time". He gets winded walking from room to room at home. He is coughing up sputum of unknown color.   Dyspnea:  Room to  room Cough: rattling in am but min mucoid sputum Sleep: on back flat  SABA use:  4 x daily on advair also rec Plan A = Automatic =  Add spiriva 2 pffs each am Pantoprazole (protonix) 40 mg   Take  30-60 min before first meal of the day and Pepcid (famotidine)  20 mg one @  bedtime until return to office   Plan B = Backup Only use your albuterol as a rescue medication  Please schedule a follow up office visit in 4 weeks, call sooner if needed with pfts      10/13/2017  f/u ov/Sritha Chauncey re: copd gold 0/ ? Ab still smoking / did not bring meds  Chief Complaint  Patient presents with  . Follow-up    PFT done today- spiro only completed due to HA during the test. He has had some cough recently- prod with yellow sputum.  He is using his albuterol inhaler 2-3 x per day. His breathing is about the same.   Dyspnea:   MMRC3 = can't walk 100 yards even at a slow pace at a flat grade s stopping due to sob   Cough: some yellow mucus  Esp hs and in am  Sleep: poorly due to cough  SABA use:  sev times  A day   No obvious day to day or daytime variability or assoc  mucus plugs or hemoptysis or cp or chest tightness, subjective wheeze or overt sinus or hb symptoms. No unusual exposure hx or h/o childhood pna/ asthma or knowledge of premature birth.   Also denies any obvious fluctuation of symptoms with weather or environmental changes or other aggravating or alleviating factors except as outlined above   Current Allergies, Complete Past Medical History, Past Surgical History, Family History, and Social History were reviewed in [employer] record.  ROS  The following are not active complaints unless bolded Hoarseness, sore throat, dysphagia, dental problems, itching, sneezing,  nasal congestion or discharge of excess mucus or purulent secretions, ear ache,   fever, chills, sweats, unintended wt loss or wt gain, classically pleuritic or exertional cp,  orthopnea pnd or leg swelling, presyncope, palpitations, abdominal pain, anorexia, nausea, vomiting, diarrhea  or change in bowel habits or change in bladder habits, change in stools or change in urine, dysuria, hematuria,  rash, arthralgias, visual complaints, headache, numbness, weakness or ataxia or problems with walking or coordination,  change in mood/affect or memory.        Current Meds -not able to verify  Medication Sig  . albuterol (VENTOLIN HFA) 108 (90 Base) MCG/ACT inhaler Inhale 2 puffs into the lungs every 6 (six) hours as needed for wheezing or shortness of breath.  . budesonide-formoterol (SYMBICORT) 80-4.5 MCG/ACT inhaler Inhale 2 puffs into the lungs 2 (two) times daily.  . clopidogrel (PLAVIX) 75 MG tablet Take 1 tablet (75 mg total) by mouth daily.  Marland Kitchen diltiazem (CARDIZEM CD) 120 MG 24 hr capsule Take 1 capsule (120 mg total)  by mouth daily.  . famotidine (PEPCID) 20 MG tablet One at bedtime  . gabapentin (NEURONTIN) 100 MG capsule Take 1 capsule (100 mg total) by mouth at bedtime.  . hydrALAZINE (APRESOLINE) 100 MG tablet Take 1 tablet by mouth twice a day take after HD on dialysis days  . isosorbide mononitrate (IMDUR) 30 MG 24 hr tablet Take 1 tablet (30 mg total) by mouth daily.  . Nutritional Supplements (FEEDING SUPPLEMENT, NEPRO CARB STEADY,) LIQD Take 237 mLs by mouth 2 (two)  times daily between meals.  Marland Kitchen oxyCODONE (OXY IR/ROXICODONE) 5 MG immediate release tablet Take 5 mg by mouth 2 (two) times daily as needed for moderate pain.   . pantoprazole (PROTONIX) 40 MG tablet Take 1 tablet (40 mg total) by mouth daily. Take 30-60 min before first meal of the day  .     . [ iotropium Bromide Monohydrate (SPIRIVA RESPIMAT) 2.5 MCG/ACT AERS Inhale 2 puffs into the lungs daily.               Objective:   Physical Exam    hoarse amb wm nad / exceptionally difficult historian   10/13/2017         155   09/08/17 158 lb 6.4 oz (71.8 kg)  09/02/17 163 lb 1.6 oz (74 kg)  08/30/17 156 lb 3.2 oz (70.9 kg)    Vital signs reviewed - Note on arrival 02 sats  98% on RA           HEENT: nl dentition, turbinates bilaterally, and oropharynx. Nl external ear canals without cough reflex   NECK :  without JVD/Nodes/TM/ nl carotid upstrokes bilaterally   LUNGS: no acc muscle use,  slt barrel contour chest which is clear to A and P with mostly upper airway wheezing    CV:  RRR II-III/VI sem no s3  ? increase in P2, and no edema   ABD:  ? Mod acites soft and nontender with nl inspiratory excursion in the supine position. No bruits or organomegaly appreciated, bowel sounds nl  MS:  Nl gait/ ext warm without deformities, calf tenderness, cyanosis or clubbing No obvious joint restrictions   SKIN: warm and dry without lesions    NEURO:  alert, approp, nl sensorium with  no motor or cerebellar deficits apparent.       I personally reviewed images and agree with radiology impression as follows:  CXR:   10/03/17 1. Stable cardiomegaly and mild pulmonary vascular congestion. 2. New increased elevation of the left hemidiaphragm with adjacent left lower lobe atelectasis.    I personally reviewed images and agree with radiology impression as follows:   abdominal  CT  09/02/17  1. Large volume ascites, similar to prior. 2. New trace bilateral pleural effusions and bibasilar subsegmental atelectasis. 3. Small pericardial effusion.    Assessment:

## 2017-10-13 NOTE — Progress Notes (Signed)
Unable to obtain full PFT due to patient not feeling well. Some spirometry was accepted.

## 2017-10-14 ENCOUNTER — Encounter (HOSPITAL_COMMUNITY): Payer: Self-pay | Admitting: Emergency Medicine

## 2017-10-14 ENCOUNTER — Other Ambulatory Visit: Payer: Self-pay

## 2017-10-14 ENCOUNTER — Observation Stay (HOSPITAL_COMMUNITY): Payer: Medicare Other

## 2017-10-14 ENCOUNTER — Observation Stay (HOSPITAL_COMMUNITY)
Admission: EM | Admit: 2017-10-14 | Discharge: 2017-10-14 | Discharge status: Against Medical Advice | Payer: Medicare Other | Attending: Internal Medicine | Admitting: Internal Medicine

## 2017-10-14 DIAGNOSIS — F419 Anxiety disorder, unspecified: Secondary | ICD-10-CM | POA: Insufficient documentation

## 2017-10-14 DIAGNOSIS — Z8249 Family history of ischemic heart disease and other diseases of the circulatory system: Secondary | ICD-10-CM | POA: Insufficient documentation

## 2017-10-14 DIAGNOSIS — Z992 Dependence on renal dialysis: Secondary | ICD-10-CM

## 2017-10-14 DIAGNOSIS — I700 Atherosclerosis of aorta: Secondary | ICD-10-CM | POA: Insufficient documentation

## 2017-10-14 DIAGNOSIS — E875 Hyperkalemia: Secondary | ICD-10-CM | POA: Diagnosis not present

## 2017-10-14 DIAGNOSIS — K626 Ulcer of anus and rectum: Secondary | ICD-10-CM | POA: Insufficient documentation

## 2017-10-14 DIAGNOSIS — N2581 Secondary hyperparathyroidism of renal origin: Secondary | ICD-10-CM | POA: Diagnosis not present

## 2017-10-14 DIAGNOSIS — K7031 Alcoholic cirrhosis of liver with ascites: Principal | ICD-10-CM | POA: Insufficient documentation

## 2017-10-14 DIAGNOSIS — Z955 Presence of coronary angioplasty implant and graft: Secondary | ICD-10-CM | POA: Insufficient documentation

## 2017-10-14 DIAGNOSIS — I252 Old myocardial infarction: Secondary | ICD-10-CM | POA: Diagnosis not present

## 2017-10-14 DIAGNOSIS — J441 Chronic obstructive pulmonary disease with (acute) exacerbation: Secondary | ICD-10-CM | POA: Insufficient documentation

## 2017-10-14 DIAGNOSIS — I4891 Unspecified atrial fibrillation: Secondary | ICD-10-CM | POA: Insufficient documentation

## 2017-10-14 DIAGNOSIS — E8770 Fluid overload, unspecified: Secondary | ICD-10-CM | POA: Diagnosis not present

## 2017-10-14 DIAGNOSIS — D631 Anemia in chronic kidney disease: Secondary | ICD-10-CM | POA: Diagnosis not present

## 2017-10-14 DIAGNOSIS — I120 Hypertensive chronic kidney disease with stage 5 chronic kidney disease or end stage renal disease: Secondary | ICD-10-CM | POA: Insufficient documentation

## 2017-10-14 DIAGNOSIS — B181 Chronic viral hepatitis B without delta-agent: Secondary | ICD-10-CM | POA: Insufficient documentation

## 2017-10-14 DIAGNOSIS — R188 Other ascites: Secondary | ICD-10-CM

## 2017-10-14 DIAGNOSIS — Z800 Family history of malignant neoplasm of digestive organs: Secondary | ICD-10-CM | POA: Insufficient documentation

## 2017-10-14 DIAGNOSIS — R109 Unspecified abdominal pain: Secondary | ICD-10-CM | POA: Diagnosis not present

## 2017-10-14 DIAGNOSIS — K5900 Constipation, unspecified: Secondary | ICD-10-CM

## 2017-10-14 DIAGNOSIS — F17210 Nicotine dependence, cigarettes, uncomplicated: Secondary | ICD-10-CM | POA: Insufficient documentation

## 2017-10-14 DIAGNOSIS — N186 End stage renal disease: Secondary | ICD-10-CM | POA: Diagnosis not present

## 2017-10-14 DIAGNOSIS — Z79899 Other long term (current) drug therapy: Secondary | ICD-10-CM | POA: Insufficient documentation

## 2017-10-14 DIAGNOSIS — Z825 Family history of asthma and other chronic lower respiratory diseases: Secondary | ICD-10-CM | POA: Insufficient documentation

## 2017-10-14 DIAGNOSIS — Z7951 Long term (current) use of inhaled steroids: Secondary | ICD-10-CM | POA: Insufficient documentation

## 2017-10-14 DIAGNOSIS — M1990 Unspecified osteoarthritis, unspecified site: Secondary | ICD-10-CM | POA: Diagnosis not present

## 2017-10-14 DIAGNOSIS — K219 Gastro-esophageal reflux disease without esophagitis: Secondary | ICD-10-CM | POA: Diagnosis not present

## 2017-10-14 DIAGNOSIS — B1920 Unspecified viral hepatitis C without hepatic coma: Secondary | ICD-10-CM | POA: Diagnosis not present

## 2017-10-14 DIAGNOSIS — Z888 Allergy status to other drugs, medicaments and biological substances status: Secondary | ICD-10-CM | POA: Insufficient documentation

## 2017-10-14 DIAGNOSIS — R1033 Periumbilical pain: Secondary | ICD-10-CM | POA: Diagnosis not present

## 2017-10-14 DIAGNOSIS — I4892 Unspecified atrial flutter: Secondary | ICD-10-CM | POA: Insufficient documentation

## 2017-10-14 DIAGNOSIS — N189 Chronic kidney disease, unspecified: Secondary | ICD-10-CM | POA: Diagnosis not present

## 2017-10-14 DIAGNOSIS — M5137 Other intervertebral disc degeneration, lumbosacral region: Secondary | ICD-10-CM | POA: Insufficient documentation

## 2017-10-14 DIAGNOSIS — N250 Renal osteodystrophy: Secondary | ICD-10-CM | POA: Diagnosis not present

## 2017-10-14 DIAGNOSIS — I050 Rheumatic mitral stenosis: Secondary | ICD-10-CM | POA: Diagnosis not present

## 2017-10-14 DIAGNOSIS — Z886 Allergy status to analgesic agent status: Secondary | ICD-10-CM | POA: Insufficient documentation

## 2017-10-14 DIAGNOSIS — Z87442 Personal history of urinary calculi: Secondary | ICD-10-CM | POA: Insufficient documentation

## 2017-10-14 DIAGNOSIS — I2510 Atherosclerotic heart disease of native coronary artery without angina pectoris: Secondary | ICD-10-CM | POA: Insufficient documentation

## 2017-10-14 DIAGNOSIS — F1410 Cocaine abuse, uncomplicated: Secondary | ICD-10-CM | POA: Diagnosis not present

## 2017-10-14 DIAGNOSIS — Z801 Family history of malignant neoplasm of trachea, bronchus and lung: Secondary | ICD-10-CM | POA: Insufficient documentation

## 2017-10-14 DIAGNOSIS — Z86010 Personal history of colonic polyps: Secondary | ICD-10-CM | POA: Diagnosis not present

## 2017-10-14 LAB — BODY FLUID CELL COUNT WITH DIFFERENTIAL
Lymphs, Fluid: 10 %
Monocyte-Macrophage-Serous Fluid: 34 % — ABNORMAL LOW (ref 50–90)
Neutrophil Count, Fluid: 56 % — ABNORMAL HIGH (ref 0–25)
Total Nucleated Cell Count, Fluid: 1657 cu mm — ABNORMAL HIGH (ref 0–1000)

## 2017-10-14 LAB — COMPREHENSIVE METABOLIC PANEL
ALT: 27 U/L (ref 17–63)
AST: 30 U/L (ref 15–41)
Albumin: 3.6 g/dL (ref 3.5–5.0)
Alkaline Phosphatase: 174 U/L — ABNORMAL HIGH (ref 38–126)
Anion gap: 13 (ref 5–15)
BUN: 48 mg/dL — ABNORMAL HIGH (ref 6–20)
CO2: 29 mmol/L (ref 22–32)
Calcium: 9.8 mg/dL (ref 8.9–10.3)
Chloride: 93 mmol/L — ABNORMAL LOW (ref 101–111)
Creatinine, Ser: 6.95 mg/dL — ABNORMAL HIGH (ref 0.61–1.24)
GFR calc Af Amer: 9 mL/min — ABNORMAL LOW (ref >60)
GFR calc non Af Amer: 8 mL/min — ABNORMAL LOW (ref >60)
Glucose, Bld: 91 mg/dL (ref 65–99)
Potassium: 5.2 mmol/L — ABNORMAL HIGH (ref 3.5–5.1)
Sodium: 135 mmol/L (ref 135–145)
Total Bilirubin: 0.9 mg/dL (ref 0.3–1.2)
Total Protein: 7.3 g/dL (ref 6.5–8.1)

## 2017-10-14 LAB — CBC WITH DIFFERENTIAL/PLATELET
Basophils Absolute: 0 10*3/uL (ref 0.0–0.1)
Basophils Relative: 0 %
Eosinophils Absolute: 0.1 10*3/uL (ref 0.0–0.7)
Eosinophils Relative: 1 %
HCT: 31.9 % — ABNORMAL LOW (ref 39.0–52.0)
Hemoglobin: 10.8 g/dL — ABNORMAL LOW (ref 13.0–17.0)
Lymphocytes Relative: 15 %
Lymphs Abs: 1.7 10*3/uL (ref 0.7–4.0)
MCH: 27.2 pg (ref 26.0–34.0)
MCHC: 33.9 g/dL (ref 30.0–36.0)
MCV: 80.4 fL (ref 78.0–100.0)
Monocytes Absolute: 0.8 10*3/uL (ref 0.1–1.0)
Monocytes Relative: 7 %
Neutro Abs: 8.8 10*3/uL — ABNORMAL HIGH (ref 1.7–7.7)
Neutrophils Relative %: 77 %
Platelets: 121 10*3/uL — ABNORMAL LOW (ref 150–400)
RBC: 3.97 MIL/uL — ABNORMAL LOW (ref 4.22–5.81)
RDW: 20.6 % — ABNORMAL HIGH (ref 11.5–15.5)
WBC: 11.5 10*3/uL — ABNORMAL HIGH (ref 4.0–10.5)

## 2017-10-14 LAB — PROTIME-INR
INR: 1.14
Prothrombin Time: 14.5 seconds (ref 11.4–15.2)

## 2017-10-14 LAB — LIPASE, BLOOD: Lipase: 27 U/L (ref 11–51)

## 2017-10-14 LAB — MAGNESIUM: Magnesium: 2.6 mg/dL — ABNORMAL HIGH (ref 1.7–2.4)

## 2017-10-14 MED ORDER — LIDOCAINE-PRILOCAINE 2.5-2.5 % EX CREA: 1.0000 "application " | TOPICAL_CREAM | CUTANEOUS | Status: DC | PRN

## 2017-10-14 MED ORDER — SODIUM CHLORIDE 0.9 % IV SOLN: 100.0000 mL | INTRAVENOUS | Status: DC | PRN

## 2017-10-14 MED ORDER — HEPARIN SODIUM (PORCINE) 1000 UNIT/ML DIALYSIS: 1000.0000 [IU] | INTRAMUSCULAR | Status: DC | PRN

## 2017-10-14 MED ORDER — ALTEPLASE 2 MG IJ SOLR: 2.0000 mg | Freq: Once | INTRAMUSCULAR | Status: DC | PRN

## 2017-10-14 MED ORDER — LIDOCAINE HCL (PF) 1 % IJ SOLN: 5.0000 mL | INTRAMUSCULAR | Status: DC | PRN

## 2017-10-14 MED ORDER — PENTAFLUOROPROP-TETRAFLUOROETH EX AERO: 1.0000 "application " | INHALATION_SPRAY | CUTANEOUS | Status: DC | PRN

## 2017-10-14 MED ORDER — CLOPIDOGREL BISULFATE 75 MG PO TABS: 75.0000 mg | ORAL_TABLET | Freq: Every day | ORAL | Status: DC

## 2017-10-14 MED ORDER — ACETAMINOPHEN 650 MG RE SUPP: 650.0000 mg | Freq: Four times a day (QID) | RECTAL | Status: DC | PRN

## 2017-10-14 MED ORDER — LACTULOSE 10 GM/15ML PO SOLN: 20.0000 g | Freq: Once | ORAL | Status: DC

## 2017-10-14 MED ORDER — MOMETASONE FURO-FORMOTEROL FUM 100-5 MCG/ACT IN AERO
2.0000 | INHALATION_SPRAY | Freq: Two times a day (BID) | RESPIRATORY_TRACT | Status: DC
  Filled 2017-10-14: qty 8.8

## 2017-10-14 MED ORDER — DILTIAZEM HCL ER COATED BEADS 120 MG PO CP24
120.0000 mg | ORAL_CAPSULE | Freq: Every day | ORAL | Status: DC
  Filled 2017-10-14: qty 1

## 2017-10-14 MED ORDER — LIDOCAINE HCL 1 % IJ SOLN
INTRAMUSCULAR | Status: AC
  Filled 2017-10-14: qty 20

## 2017-10-14 MED ORDER — MORPHINE SULFATE (PF) 4 MG/ML IV SOLN
4.0000 mg | Freq: Once | INTRAVENOUS | Status: AC
  Administered 2017-10-14: 4 mg via INTRAVENOUS
  Filled 2017-10-14: qty 1

## 2017-10-14 MED ORDER — MOMETASONE FURO-FORMOTEROL FUM 100-5 MCG/ACT IN AERO: 2.0000 | INHALATION_SPRAY | Freq: Two times a day (BID) | RESPIRATORY_TRACT | Status: DC

## 2017-10-14 MED ORDER — NEPRO/CARBSTEADY PO LIQD
237.0000 mL | Freq: Two times a day (BID) | ORAL | Status: DC
  Filled 2017-10-14 (×3): qty 237

## 2017-10-14 MED ORDER — OXYCODONE HCL 5 MG PO TABS
5.0000 mg | ORAL_TABLET | Freq: Two times a day (BID) | ORAL | Status: DC | PRN
  Administered 2017-10-14: 5 mg via ORAL
  Filled 2017-10-14: qty 1

## 2017-10-14 MED ORDER — ISOSORBIDE MONONITRATE ER 30 MG PO TB24
30.0000 mg | ORAL_TABLET | Freq: Every day | ORAL | Status: DC
  Filled 2017-10-14: qty 1

## 2017-10-14 MED ORDER — ACETAMINOPHEN 325 MG PO TABS: 650.0000 mg | ORAL_TABLET | Freq: Four times a day (QID) | ORAL | Status: DC | PRN

## 2017-10-14 MED ORDER — IPRATROPIUM-ALBUTEROL 0.5-2.5 (3) MG/3ML IN SOLN: 3.0000 mL | RESPIRATORY_TRACT | Status: DC | PRN

## 2017-10-14 MED ORDER — HYDRALAZINE HCL 50 MG PO TABS
100.0000 mg | ORAL_TABLET | Freq: Two times a day (BID) | ORAL | Status: DC
  Filled 2017-10-14: qty 2

## 2017-10-14 MED ORDER — HEPARIN SODIUM (PORCINE) 5000 UNIT/ML IJ SOLN: 5000.0000 [IU] | Freq: Three times a day (TID) | INTRAMUSCULAR | Status: DC

## 2017-10-14 MED ORDER — PROCHLORPERAZINE EDISYLATE 5 MG/ML IJ SOLN
10.0000 mg | Freq: Once | INTRAMUSCULAR | Status: AC
  Administered 2017-10-14: 10 mg via INTRAVENOUS
  Filled 2017-10-14: qty 2

## 2017-10-14 MED ORDER — DOCUSATE SODIUM 100 MG PO CAPS: 100.0000 mg | ORAL_CAPSULE | Freq: Two times a day (BID) | ORAL | Status: DC

## 2017-10-14 NOTE — Procedures (Signed)
Ultrasound-guided diagnostic and therapeutic paracentesis performed yielding 3.7 liters of bloody fluid. No immediate complications.  A portion of the fluid was submitted to the lab for preordered studies.  Dr. Sheran Fava was notified of the above findings.

## 2017-10-14 NOTE — ED Notes (Signed)
ED TO INPATIENT HANDOFF REPORT  Name/Age/Gender [name] 57 y.o. male  Code Status    Code Status Orders  (From admission, onward)        Start     Ordered   10/14/17 0902  Full code  Continuous     10/14/17 0903    Code Status History    Date Active Date Inactive Code Status Order ID Comments User Context   09/29/2017 0302 09/30/2017 1201 Full Code [id number]  Toy Baker, MD Inpatient   09/02/2017 1937 09/03/2017 1333 Full Code [id number]  Gwynne Edinger, MD ED   09/02/2017 1922 09/02/2017 1937 Full Code [id number]  Tawni Millers, MD ED   08/29/2017 1744 08/30/2017 1805 Full Code [id number]  Brenton Grills, PA-C ED   08/29/2017 1741 08/29/2017 1744 Full Code [id number]  Brenton Grills, PA-C ED   08/20/2017 2036 08/21/2017 1547 Full Code [id number]  Cristy Folks, MD Inpatient   07/15/2017 2234 07/18/2017 1357 Full Code [id number]  Omar Person, NP Inpatient   07/04/2017 1548 07/15/2017 2234 Full Code [id number]  Elam Dutch, MD Inpatient   06/18/2017 1457 06/23/2017 1856 Full Code [id number]  Samella Parr, NP ED   06/12/2017 1521 06/17/2017 2037 Full Code [id number]  Nicholes Mango, MD Inpatient   06/10/2017 1637 06/12/2017 1352 Full Code [id number]  Clovis Fredrickson, MD Inpatient   05/30/2017 2016 06/10/2017 1631 Full Code [id number]  Jani Gravel, MD Inpatient   05/26/2017 1149 05/26/2017 1528 Full Code [id number]  Conrad Humboldt, MD Inpatient   04/23/2017 0441 04/25/2017 2138 Full Code [id number]  Toy Baker, MD Inpatient   03/25/2017 0054 03/26/2017 1455 Full Code [id number]  Norval Morton, MD ED   02/20/2017 1208 02/23/2017 1841 Full Code [id number]  Rondel Jumbo, PA-C ED   08/30/2016 0304 08/30/2016 2126 Full Code [id number]  Norval Morton, MD ED   08/17/2016 0115 08/18/2016 1447 Full Code [id number]  Ivor Costa, MD ED   07/23/2016 1655 07/24/2016 1455 Full Code [id number]  Alvia Grove, PA-C Inpatient   02/27/2014 2306 02/28/2014 1343  Full Code [id number]  Adrian Prows, MD ED      Home/SNF/Other Home  Chief Complaint Abdominal Pain  Level of Care/Admitting Diagnosis ED Disposition    ED Disposition Condition [place]: [facility] [100100]  Level of Care: Telemetry [5]  Diagnosis: Ascites due to alcoholic cirrhosis [facility]) [[id number]]  Admitting Physician: Janece Canterbury [phone]  Attending Physician: Janece Canterbury [[id number]]  PT Class (Do Not Modify): Observation [104]  PT Acc Code (Do Not Modify): Observation [10022]       Medical History Past Medical History:  Diagnosis Date  . Adenomatous colon polyp    tubular  . Anemia   . Anxiety   . Arthritis    left shoulder  . Atherosclerosis of aorta ([place])   . Cardiomegaly   . Chest pain    DATE UNKNOWN, C/O PERIODICALLY  . Cocaine abuse ([place])   . COPD exacerbation ([place]) 08/17/2016  . Coronary artery disease    stent 02/22/17  . Dialysis patient ([place])    Monday-Wednesday-Friday  . ESRD (end stage renal disease) on dialysis ([place])    "[name]; MWF" (07/04/2017)  . GERD (gastroesophageal reflux disease)    DATE UNKNOWN  . Hemorrhoids   . Hepatitis B, chronic ([place])   . Hepatitis C   . History of kidney stones   . Hyperkalemia   . Hypertension   .  Metabolic bone disease    Patient denies  . Myocardial infarction ([place])   . Pneumonia   . Pulmonary edema   . Renal disorder   . Renal insufficiency   . Shortness of breath dyspnea    " for the last past year with this dialysis"  . Solitary rectal ulcer syndrome   . Tubular adenoma of colon     Allergies Allergies  Allergen Reactions  . Aspirin Other (See Comments)    STOMACH PAIN  . Clonidine Derivatives Itching  . Tramadol Itching    IV Location/Drains/Wounds Patient Lines/Drains/Airways Status   Active Line/Drains/Airways    Name:   Placement date:   Placement time:   Site:   Days:   Peripheral IV 09/28/17 Left Forearm   09/28/17    2005    Forearm   16    Peripheral IV 10/14/17 Left Forearm   10/14/17    0508    Forearm   less than 1   Fistula / Graft Left Upper arm   12/13/15    2340    Upper arm   671   Fistula / Graft Right Upper arm Arteriovenous fistula   -    -    Upper arm      Hemodialysis Catheter Right Subclavian Double-lumen   05/30/17    1734    Subclavian   137          Labs/Imaging Results for orders placed or performed during the hospital encounter of 10/14/17 (from the past 48 hour(s))  Comprehensive metabolic panel     Status: Abnormal   Collection Time: 10/14/17  5:07 AM  Result Value Ref Range   Sodium 135 135 - 145 mmol/L   Potassium 5.2 (H) 3.5 - 5.1 mmol/L   Chloride 93 (L) 101 - 111 mmol/L   CO2 29 22 - 32 mmol/L   Glucose, Bld 91 65 - 99 mg/dL   BUN 48 (H) 6 - 20 mg/dL   Creatinine, Ser 6.95 (H) 0.61 - 1.24 mg/dL   Calcium 9.8 8.9 - 10.3 mg/dL   Total Protein 7.3 6.5 - 8.1 g/dL   Albumin 3.6 3.5 - 5.0 g/dL   AST 30 15 - 41 U/L   ALT 27 17 - 63 U/L   Alkaline Phosphatase 174 (H) 38 - 126 U/L   Total Bilirubin 0.9 0.3 - 1.2 mg/dL   GFR calc non Af Amer 8 (L) >60 mL/min   GFR calc Af Amer 9 (L) >60 mL/min    Comment: (NOTE) The eGFR has been calculated using the CKD EPI equation. This calculation has not been validated in all clinical situations. eGFR's persistently <60 mL/min signify possible Chronic Kidney Disease.    Anion gap 13 5 - 15    Comment: Performed at [facility], [place] [address] [place], [place] [address]  Lipase, blood     Status: None   Collection Time: 10/14/17  5:07 AM  Result Value Ref Range   Lipase 27 11 - 51 U/L    Comment: Performed at [facility], [place] [address] [place], [place] [address]  CBC with Differential     Status: Abnormal   Collection Time: 10/14/17  5:07 AM  Result Value Ref Range   WBC 11.5 (H) 4.0 - 10.5 K/uL   RBC 3.97 (L) 4.22 - 5.81 MIL/uL   Hemoglobin 10.8 (L) 13.0 - 17.0 g/dL   HCT 31.9 (L) 39.0 - 52.0 %   MCV  80.4 78.0 - 100.0 fL   MCH 27.2 26.0 - 34.0 pg   MCHC 33.9 30.0 - 36.0 g/dL   RDW 20.6 (H) 11.5 - 15.5 %   Platelets 121 (L) 150 - 400 K/uL   Neutrophils Relative % 77 %   Neutro Abs 8.8 (H) 1.7 - 7.7 K/uL   Lymphocytes Relative 15 %   Lymphs Abs 1.7 0.7 - 4.0 K/uL   Monocytes Relative 7 %   Monocytes Absolute 0.8 0.1 - 1.0 K/uL   Eosinophils Relative 1 %   Eosinophils Absolute 0.1 0.0 - 0.7 K/uL   Basophils Relative 0 %   Basophils Absolute 0.0 0.0 - 0.1 K/uL    Comment: Performed at [facility], [place] [address] [place], [place] [address]  Protime-INR     Status: None   Collection Time: 10/14/17  5:07 AM  Result Value Ref Range   Prothrombin Time 14.5 11.4 - 15.2 seconds   INR 1.14     Comment: Performed at [facility], [place] [address] [place], [place] [address]  Magnesium     Status: Abnormal   Collection Time: 10/14/17  5:07 AM  Result Value Ref Range   Magnesium 2.6 (H) 1.7 - 2.4 mg/dL    Comment: Performed at [facility], [place] [address], [place], [place] [address]  Body fluid cell count with differential     Status: Abnormal   Collection Time: 10/14/17 10:48 AM  Result Value Ref Range   Fluid Type-FCT Peritoneal    Color, Fluid RED (A) YELLOW   Appearance, Fluid TURBID (A) CLEAR   WBC, Fluid 1,657 (H) 0 - 1,000 cu mm   Neutrophil Count, Fluid 56 (H) 0 - 25 %   Lymphs, Fluid 10 %   Monocyte-Macrophage-Serous Fluid 34 (L) 50 - 90 %   Other Cells, Fluid CORRELATE WITH CYTOLOGY. %    Comment: Performed at [facility], [place] [address], [place], [place] [address]  Body fluid culture     Status: None (Preliminary result)   Collection Time: 10/14/17 10:48 AM  Result Value Ref Range   Specimen Description      PERITONEAL Performed at [place] [address] [place], [place] [address]    Special Requests      NONE Performed at [facility], [place]  [address] [place], [place] [address]    Gram Stain      RARE WBC PRESENT, PREDOMINANTLY PMN NO ORGANISMS SEEN Performed at [facility], [place] [address] [place], [place] [address]    Culture PENDING    Report Status PENDING    US Paracentesis  Result Date: 10/14/2017 INDICATION: Patient with history of alcoholic cirrhosis, hepatitis B/C, end-stage renal disease, recurrent ascites. Request made for diagnostic and therapeutic paracentesis. EXAM: ULTRASOUND GUIDED DIAGNOSTIC AND THERAPEUTIC PARACENTESIS MEDICATIONS: None COMPLICATIONS: None immediate. PROCEDURE: Informed written consent was obtained from the patient after a discussion of the risks, benefits and alternatives to treatment. A timeout was performed prior to the initiation of the procedure. Initial ultrasound scanning demonstrates a moderate-to-large amount of ascites within the left mid to lower abdominal quadrant. The left mid to lower abdomen was prepped and draped in the usual sterile fashion. 1% lidocaine was used for local anesthesia. Following this, a 6 Fr Safe-T-Centesis catheter was introduced. An ultrasound image was saved for documentation purposes. The paracentesis was performed. The catheter was removed and a dressing was applied. The patient tolerated  the procedure well without immediate post procedural complication. FINDINGS: A total of approximately 3.7 liters of bloody fluid was removed. Samples were sent to the laboratory as requested by the clinical team. IMPRESSION: Successful ultrasound-guided diagnostic and therapeutic paracentesis yielding 3.7 liters of bloody peritoneal fluid. Dr. Sheran Fava was notified of the above findings. Read by: Rowe Robert, PA-C Electronically Signed   By: Marybelle Killings M.D.   On: 10/14/2017 11:41    Pending Labs Unresulted Labs (From admission, onward)   Start     Ordered   10/15/17 0500  Comprehensive metabolic panel  Tomorrow morning,   R    Question:  Specimen collection method  Answer:   IV Team   10/14/17 0903   10/15/17 0500  CBC  Tomorrow morning,   R    Question:  Specimen collection method  Answer:  IV Team   10/14/17 0903   10/14/17 1054  CBC  Now then every 6 hours,   R    Question:  Specimen collection method  Answer:  IV Team   10/14/17 1053   Signed and Held  Renal function panel  Once,   R     Signed and Held   Signed and Held  CBC  Once,   R     Signed and Held   Signed and Held  Renal function panel  Once,   R     Signed and Held   Signed and Held  CBC  Once,   R     Signed and Held      Vitals/Pain Today's Vitals   10/14/17 1042 10/14/17 1047 10/14/17 1055 10/14/17 1214  BP: 117/82 107/89 105/85 (!) 161/104  Pulse:    (!) 109  Resp:    16  Temp:      TempSrc:      SpO2:    95%  PainSc:        Isolation Precautions No active isolations  Medications Medications  diltiazem (CARDIZEM CD) 24 hr capsule 120 mg (has no administration in time range)  lactulose (CHRONULAC) 10 GM/15ML solution 20 g (has no administration in time range)  hydrALAZINE (APRESOLINE) tablet 100 mg (has no administration in time range)  isosorbide mononitrate (IMDUR) 24 hr tablet 30 mg (has no administration in time range)  feeding supplement (NEPRO CARB STEADY) liquid 237 mL (has no administration in time range)  oxyCODONE (Oxy IR/ROXICODONE) immediate release tablet 5 mg (has no administration in time range)  ipratropium-albuterol (DUONEB) 0.5-2.5 (3) MG/3ML nebulizer solution 3 mL (has no administration in time range)  acetaminophen (TYLENOL) tablet 650 mg (has no administration in time range)    Or  acetaminophen (TYLENOL) suppository 650 mg (has no administration in time range)  docusate sodium (COLACE) capsule 100 mg (has no administration in time range)  lidocaine (XYLOCAINE) 1 % (with pres) injection (has no administration in time range)  mometasone-formoterol (DULERA) 100-5 MCG/ACT inhaler 2 puff (2 puffs Inhalation Not Given 10/14/17 1345)  morphine 4 MG/ML  injection 4 mg (4 mg Intravenous Given 10/14/17 0508)  prochlorperazine (COMPAZINE) injection 10 mg (10 mg Intravenous Given 10/14/17 0509)    Mobility walks with device

## 2017-10-14 NOTE — Discharge Summary (Signed)
Physician Discharge Summary  [name] [id number]:[id number] DOB: [birth year] DOA: 10/14/2017  PCP: [name], MD  Admit date: 10/14/2017 Left  AMA on: 10/14/2017  Admitted From: home  Disposition:  home  Discharge Condition:  fair  Brief/Interim Summary:  [name] is a 57 y.o. male with history of end-stage renal disease on hemodialysis MWF, cirrhosis secondary to hepatitis C, hepatitis B, and alcohol abuse, polysubstance abuse with history of suicidal ideation, COPD, atrial flutter with RVR, chronic shortness of breath who presents with abdominal pain.  He has had 10 admissions over the last 6 months.  He has very limited medical insight and does not note any of his medications.  The patient also has a difficult time reporting a history and states he is unsure if his shortness of breath since his last admission a few days ago has improved at all.  He still feels [name] of breath at rest but is able to lie flat.  About 3 days prior to admission he developed some mild periumbilical pain that is been constant and worse with eating.  He denies radiation to the flanks or back.  He has some nausea when he tries to eat but denies vomiting.  He has been constipated and had several pebble stools yesterday.  He has to take laxatives in order to have bowel movements and usually takes milk of magnesia.    ED Course: Vital signs tachycardic to the 120s, blood pressure mildly elevated, stable on room air.  Labs: Potassium 5.2, magnesium 2.6, creatinine elevated as expected 6.95, LFTs and lipase within normal limits.  WBC 11.5.  Patient is an anuric.  An ultrasound-guided paracentesis has been ordered.   Discharge Diagnoses:  Active Problems:   Ascites   Abdominal pain, may be secondary to ascites, although his abdominal exam does not demonstrate tense ascites that would cause this degree of pain.  Alternatively he may have SBP and he also describes some symptoms of constipation.   LFTs and lipase are normal. - Ultrasound-guided diagnostic and therapeutic paracentesis ordered:  Bloody fluid similar to last paracentesis -  Previous cytology negative, but will repeat  -  Had CT angio in Jan and repeat CT WO contrast of abd in March w/o evidence of malignancy -  INR wnl -  Repeat CBC this afternoon to verify hgb stable - Previous SAAG has been c/w portal hypertension so did not repeat - Cell count and fluid culture ordered - Lactulose 20 g once for constipation -  Counseled patient to stop using magnesium containing medications for bowel relief -  Hold plavix and heparin  Cirrhosis secondary to hepatitis B, hepatitis C, and alcoholism, evidence of cirrhosis seen on CT scan and ascites is consistent with portal hypertension.  Spleen appears normal. -May need to have his dry weight decreased at hemodialysis to prevent recurrent fluid accumulation - had paracentesis in February and again in March - f/u with GI  End-stage renal disease, usually Monday, Wednesday, Friday -Transfer to [name] for nonemergent hemodialysis either today or tomorrow  Sinus tachycardia, may be related to pain or not receiving home medications.  History of a-fib/a-flutter status post cardioversion. -  Resume diltiazem -  Treat pain -  Monitor on tele 2/2 hx of a-flutter but rate/appearance on tele currently inconsistent with flutter -  Not on anticoagulation due to medication nonadherence  CAD status post mid RCA PCI August 2018 -  Hold plavix (has been on more than 6 months post stent) -  Continue  CCB and imdur  Essential hypertension, blood pressure elevated -Resume diltiazem, hydralazine  Moderate to severe mitral stenosis, may be contributing to dyspnea -Follow-up with cardiology  COPD s/p recent acute exacerbation - continue LABA/ICS -  Duonebs prn  Polysubstance abuse including cigarette nicotine dependence -Counseled cessation -Patient declined nicotine  patch  Recent GI bleed secondary to rectal ulcer -Hemoglobin appears to have stabilized, patient does not report any evidence of rectal bleeding  Anemia of renal disease, hemoglobin stable from prior,     Discharge Instructions     Allergies  Allergen Reactions  . Aspirin Other (See Comments)    STOMACH PAIN  . Clonidine Derivatives Itching  . Tramadol Itching    Consultations: Nephrology   Procedures/Studies: Ct Abdomen Pelvis Wo Contrast  Result Date: 09/28/2017 CLINICAL DATA:  History of ascites. Abdominal distension. Question small bowel obstruction on radiographs. EXAM: CT ABDOMEN AND PELVIS WITHOUT CONTRAST TECHNIQUE: Multidetector CT imaging of the abdomen and pelvis was performed following the standard protocol without IV contrast. COMPARISON:  Abdominal radiographs earlier this day.  CT 09/02/2017 FINDINGS: Lower chest: The heart is enlarged. Minimal pericardial thickening/fluid. Left basilar atelectasis. Hepatobiliary: Slight capsular nodularity which may represent cirrhosis. No discrete focal lesion. Gallbladder is not well visualized due to adjacent ascites. Pancreas: No ductal dilatation. Limited assessment for peripancreatic inflammation given adjacent ascites. Spleen: Normal in size without focal abnormality. Adrenals/Urinary Tract: Adrenal glands not as well visualized on the current exam in the absence of IV contrast. Atrophic kidneys consistent with chronic renal disease. Probable parenchymal calcification in the upper right kidney. Advanced renovascular calcifications. Urinary bladder is nondistended. Stomach/Bowel: Administered enteric contrast within the stomach and proximal small bowel. Bowel loops upper normal caliber measuring 2.9 cm but no definite bowel dilatation to suggest obstruction. More distal small bowel is nondistended. No colonic distension. Probable clip in the distal sigmoid colon. Vascular/Lymphatic: Advanced aortic and branch atherosclerosis.  Limited assessment for adenopathy given presence of ascites. Reproductive: Prostate is unremarkable. Other: Large volume intra-abdominopelvic ascites, progressed from prior CT. Chronic hyperdensity dependently in the is situs in the right pelvis, unchanged dating back to at least 2018 and of doubtful clinical significance. No free air. Musculoskeletal: Sequela of renal osteodystrophy with degenerative disc disease L5-S1. There are no acute or suspicious osseous abnormalities. IMPRESSION: 1. Slight small bowel prominence suggesting ileus. No evidence of obstruction. 2. Large volume intra-abdominopelvic ascites, increased from CT 1 month ago. 3. Advanced Aortic Atherosclerosis (ICD10-I70.0). Electronically Signed   By: Jeb Levering M.D.   On: 09/28/2017 23:55   Dg Chest 2 View  Result Date: 10/03/2017 CLINICAL DATA:  Shortness of breath. EXAM: CHEST - 2 VIEW COMPARISON:  Chest x-ray dated September 28, 2017. FINDINGS: Unchanged tunneled right internal jugular dialysis catheter with the tip in the right atrium. Stable cardiomegaly and mild pulmonary vascular congestion. New increased elevation of the left hemidiaphragm with adjacent left basilar atelectasis. No focal consolidation, pleural effusion, or pneumothorax. No acute osseous abnormality. Gaseous distention of the stomach. IMPRESSION: 1. Stable cardiomegaly and mild pulmonary vascular congestion. 2. New increased elevation of the left hemidiaphragm with adjacent left lower lobe atelectasis. Electronically Signed   By: Titus Dubin M.D.   On: 10/03/2017 15:57   Dg Chest 2 View  Result Date: 09/26/2017 CLINICAL DATA:  Rosalinda Seaman of breath EXAM: CHEST - 2 VIEW COMPARISON:  09/02/2017 FINDINGS: Right-sided central venous catheter tip overlies the right atrium. Aortic atherosclerosis. Carotid vascular calcifications. Axillary vascular. Cardiomegaly with elevation of left diaphragm as before. No  large effusion. No focal consolidation. Vascular congestion. No  pneumothorax. IMPRESSION: 1. Cardiomegaly with mild vascular congestion 2. No pleural effusion or focal pulmonary infiltrate. Electronically Signed   By: Donavan Foil M.D.   On: 09/26/2017 21:36   US Paracentesis  Result Date: 10/14/2017 INDICATION: Patient with history of alcoholic cirrhosis, hepatitis B/C, end-stage renal disease, recurrent ascites. Request made for diagnostic and therapeutic paracentesis. EXAM: ULTRASOUND GUIDED DIAGNOSTIC AND THERAPEUTIC PARACENTESIS MEDICATIONS: None COMPLICATIONS: None immediate. PROCEDURE: Informed written consent was obtained from the patient after a discussion of the risks, benefits and alternatives to treatment. A timeout was performed prior to the initiation of the procedure. Initial ultrasound scanning demonstrates a moderate-to-large amount of ascites within the left mid to lower abdominal quadrant. The left mid to lower abdomen was prepped and draped in the usual sterile fashion. 1% lidocaine was used for local anesthesia. Following this, a 6 Fr Safe-T-Centesis catheter was introduced. An ultrasound image was saved for documentation purposes. The paracentesis was performed. The catheter was removed and a dressing was applied. The patient tolerated the procedure well without immediate post procedural complication. FINDINGS: A total of approximately 3.7 liters of bloody fluid was removed. Samples were sent to the laboratory as requested by the clinical team. IMPRESSION: Successful ultrasound-guided diagnostic and therapeutic paracentesis yielding 3.7 liters of bloody peritoneal fluid. Dr. Sheran Fava was notified of the above findings. Read by: Rowe Robert, PA-C Electronically Signed   By: Marybelle Killings M.D.   On: 10/14/2017 11:41   Dg Abd Acute W/chest  Result Date: 09/28/2017 CLINICAL DATA:  Shortness of breath, abdominal pain, distention and diarrhea for 3 days. EXAM: DG ABDOMEN ACUTE W/ 1V CHEST COMPARISON:  PA and lateral chest 09/26/2017. CT abdomen and pelvis  09/02/2017. FINDINGS: Single-view of the chest demonstrates enlarged cardiopericardial silhouette consistent with cardiomegaly and small pericardial effusion seen on prior CT. There is pulmonary vascular congestion. No consolidative process, pneumothorax or effusion. Aortic atherosclerosis is seen. Dialysis catheter is unchanged. Two views of the abdomen show no free intraperitoneal air. There is centralization of bowel loops and increased density of the abdomen consistent with ascites as seen on prior CT. Small bowel loops are mildly dilated up to 4.0 cm. IMPRESSION: Mild dilatation of small bowel loops could be due to ileus or obstruction. Findings consistent with ascites as seen on prior CT. Enlarged cardiopericardial silhouette consistent with cardiomegaly and small pericardial effusion as seen on prior CT. Pulmonary vascular congestion. Electronically Signed   By: Inge Rise M.D.   On: 09/28/2017 19:46   Ir Paracentesis  Result Date: 09/29/2017 INDICATION: Patient with recurrent ascites. Request is made for diagnostic and therapeutic paracentesis. EXAM: ULTRASOUND GUIDED DIAGNOSTIC AND THERAPEUTIC PARACENTESIS MEDICATIONS: 10 mL 1% lidocaine COMPLICATIONS: None immediate. PROCEDURE: Informed written consent was obtained from the patient after a discussion of the risks, benefits and alternatives to treatment. A timeout was performed prior to the initiation of the procedure. Initial ultrasound scanning demonstrates a small amount of ascites within the left lateral abdomen. The left lateral abdomen was prepped and draped in the usual sterile fashion. 1% lidocaine was used for local anesthesia. Following this, a 19 gauge, 7-cm, Yueh catheter was introduced. An ultrasound image was saved for documentation purposes. The paracentesis was performed. The catheter was removed and a dressing was applied. The patient tolerated the procedure well without immediate post procedural complication. FINDINGS: A total of  approximately 1.6 liters of bloody fluid was removed. Samples were sent to the laboratory as requested by the  clinical team. IMPRESSION: Successful ultrasound-guided diagnostic and therapeutic paracentesis yielding 1.6 liters of peritoneal fluid. Read by: Brynda Greathouse PA-C Electronically Signed   By: Lucrezia Europe M.D.   On: 09/29/2017 14:44     Subjective: See H&P  Discharge Exam: Vitals:   10/14/17 1602 10/14/17 1627  BP: 93/62 (!) 152/94  Pulse: (!) 110 (!) 114  Resp: 18 17  Temp:    SpO2: 100% 99%   Vitals:   10/14/17 1532 10/14/17 1602 10/14/17 1627 10/14/17 1632  BP: 93/62 93/62 (!) 152/94   Pulse: (!) 117 (!) 110 (!) 114   Resp: 18 18 17    Temp:      TempSrc:      SpO2: 98% 100% 99%   Weight:    65.2 kg (143 lb 11.8 oz)   See H&P  The results of significant diagnostics from this hospitalization (including imaging, microbiology, ancillary and laboratory) are listed below for reference.     Microbiology: Recent Results (from the past 240 hour(s))  Body fluid culture     Status: None (Preliminary result)   Collection Time: 10/14/17 10:48 AM  Result Value Ref Range Status   Specimen Description   Final    PERITONEAL Performed at [facility] [address], [place], [place] [address]    Special Requests   Final    NONE Performed at [facility], [place] [address], [place], [place] [address]    Gram Stain   Final    RARE WBC PRESENT, PREDOMINANTLY PMN NO ORGANISMS SEEN Performed at [facility], [place] [address] [place], [place] [address]    Culture PENDING  Incomplete   Report Status PENDING  Incomplete     Labs: BNP (last 3 results) Recent Labs    07/08/17 1959 08/20/17 1455 08/29/17 1235  BNP 1,100.4* 1,418.4* 761.9*   Basic Metabolic Panel: Recent Labs  Lab 10/14/17 0507  NA 135  K 5.2*  CL 93*  CO2 29  GLUCOSE 91  BUN 48*  CREATININE 6.95*  CALCIUM 9.8  MG 2.6*   Liver Function Tests: Recent  Labs  Lab 10/14/17 0507  AST 30  ALT 27  ALKPHOS 174*  BILITOT 0.9  PROT 7.3  ALBUMIN 3.6   Recent Labs  Lab 10/14/17 0507  LIPASE 27   No results for input(s): AMMONIA in the last 168 hours. CBC: Recent Labs  Lab 10/14/17 0507  WBC 11.5*  NEUTROABS 8.8*  HGB 10.8*  HCT 31.9*  MCV 80.4  PLT 121*   Cardiac Enzymes: No results for input(s): CKTOTAL, CKMB, CKMBINDEX, TROPONINI in the last 168 hours. BNP: Invalid input(s): POCBNP CBG: No results for input(s): GLUCAP in the last 168 hours. D-Dimer No results for input(s): DDIMER in the last 72 hours. Hgb A1c No results for input(s): HGBA1C in the last 72 hours. Lipid Profile No results for input(s): CHOL, HDL, LDLCALC, TRIG, CHOLHDL, LDLDIRECT in the last 72 hours. Thyroid function studies No results for input(s): TSH, T4TOTAL, T3FREE, THYROIDAB in the last 72 hours.  Invalid input(s): FREET3 Anemia work up No results for input(s): VITAMINB12, FOLATE, FERRITIN, TIBC, IRON, RETICCTPCT in the last 72 hours. Urinalysis    Component Value Date/Time   COLORURINE YELLOW 07/16/2011 1619   APPEARANCEUR CLEAR 07/16/2011 1619   LABSPEC 1.018 07/16/2011 1619   PHURINE 7.5 07/16/2011 1619   GLUCOSEU 250 (A) 07/16/2011 1619   HGBUR MODERATE (A) 07/16/2011 1619   BILIRUBINUR SMALL (A) 07/16/2011 1619   Benjamin Stain  NEGATIVE 07/16/2011 1619   PROTEINUR >300 (A) 07/16/2011 1619   UROBILINOGEN 1.0 07/16/2011 1619   NITRITE NEGATIVE 07/16/2011 1619   LEUKOCYTESUR SMALL (A) 07/16/2011 1619   Sepsis Labs Invalid input(s): PROCALCITONIN,  WBC,  LACTICIDVEN   Time coordinating discharge: Over 30 minutes  SIGNED:   Janece Canterbury, MD  [facility] 10/14/2017, 8:23 PM Pager   If 7PM-7AM, please contact night-coverage [email or web address] Password [email or web address]

## 2017-10-14 NOTE — Progress Notes (Signed)
Pt now states he would like to leave AMA now. MD notified of his 2 hour request and the present request.

## 2017-10-14 NOTE — Progress Notes (Signed)
Attempted to do nursing admission hx. Pt states he is going to [facility] and did not want to answer questions here. Lucius Conn BSN, RN-BC Admissions RN 10/14/2017 3:10 PM

## 2017-10-14 NOTE — Progress Notes (Signed)
AMA  Pt has left AMA after RN removed his PIV and he signed the AMA form. HD and Dr Sheran Fava notified.

## 2017-10-14 NOTE — ED Notes (Signed)
[facility] HERE  FOR TRANSPORT. PAULA RN REC'D REPORT. PT DECLINED TRANSPORT AT FIRST REQUESTING PAIN MEDICATION. EDP WENTZ AT BEDSIDE TO EVALUATE PT. THIS WRITER CONTRACTED WITH PT PRIOR. PT GIVEN PAIN MEDICATION. PT AGREED TO TRANSPORT.PT UNDERSTOOD HE WOULD BE GOING TO ROOM AND ADMITTING MD WOULD BE PAGED AND WOULD SEE HIM IN HIS ROOM. PT'S BELONGINGS AND CANE WITH PT UPON TRANSFER. PT TRANSFERRED WITHOUT EVENT.

## 2017-10-14 NOTE — Progress Notes (Signed)
Patient asking to leave AMA.  Declined HD once he got to [facility].  Advised him that due to his bloody paracentesis fluid that I would like to at least get a repeat CBC with possible CT angio if his blood counts were dropping.  Also advised him that his potassium was already high and it could become fatally high if he does not receive HD soon.  Either condition (arterial bleed or hyperkalemia) could be fatal.  Patient states "that's between me and God" and "I am not staying another night in the hospital."

## 2017-10-14 NOTE — H&P (Addendum)
History and Physical    Marvion Bastidas QBH:[id number] DOB: [birth year] DOA: 10/14/2017  Referring MD/NP/PA: Delora Fuel PCP: [name], MD   Patient coming from: home  Chief Complaint: abdominal pain  HPI: [name] is a 57 y.o. male with history of end-stage renal disease on hemodialysis MWF, cirrhosis secondary to hepatitis C, hepatitis B, and alcohol abuse, polysubstance abuse with history of suicidal ideation, COPD, atrial flutter with RVR, chronic shortness of breath who presents with abdominal pain.  He has had 10 admissions over the last 6 months.  He has very limited medical insight and does not note any of his medications.  The patient also has a difficult time reporting a history and states he is unsure if his shortness of breath since his last admission a few days ago has improved at all.  He still feels Jowana Thumma of breath at rest but is able to lie flat.  About 3 days prior to admission he developed some mild periumbilical pain that is been constant and worse with eating.  He denies radiation to the flanks or back.  He has some nausea when he tries to eat but denies vomiting.  He has been constipated and had several pebble stools yesterday.  He has to take laxatives in order to have bowel movements and usually takes milk of magnesia.    ED Course: Vital signs tachycardic to the 120s, blood pressure mildly elevated, stable on room air.  Labs: Potassium 5.2, magnesium 2.6, creatinine elevated as expected 6.95, LFTs and lipase within normal limits.  WBC 11.5.  Patient is an anuric.  An ultrasound-guided paracentesis has been ordered.  Review of Systems:  He denies chest pains, palpitations, recent fevers or chills, sinus congestion.  Complete 12 point review of systems reviewed with patient and negative except as mentioned above.    Past Medical History:  Diagnosis Date  . Adenomatous colon polyp    tubular  . Anemia   . Anxiety   . Arthritis    left  shoulder  . Atherosclerosis of aorta ([place])   . Cardiomegaly   . Chest pain    DATE UNKNOWN, C/O PERIODICALLY  . Cocaine abuse ([place])   . COPD exacerbation ([place]) 08/17/2016  . Coronary artery disease    stent 02/22/17  . Dialysis patient ([place])    Monday-Wednesday-Friday  . ESRD (end stage renal disease) on dialysis ([place])    "[name]; MWF" (07/04/2017)  . GERD (gastroesophageal reflux disease)    DATE UNKNOWN  . Hemorrhoids   . Hepatitis B, chronic ([place])   . Hepatitis C   . History of kidney stones   . Hyperkalemia   . Hypertension   . Metabolic bone disease    Patient denies  . Myocardial infarction ([place])   . Pneumonia   . Pulmonary edema   . Renal disorder   . Renal insufficiency   . Shortness of breath dyspnea    " for the last past year with this dialysis"  . Solitary rectal ulcer syndrome   . Tubular adenoma of colon     Past Surgical History:  Procedure Laterality Date  . A/V FISTULAGRAM Left 05/26/2017   Procedure: A/V FISTULAGRAM;  Surgeon: Conrad Notus, MD;  Location: [place] [facility];  Service: Cardiovascular;  Laterality: Left;  . APPLICATION OF WOUND VAC Left 06/14/2017   Procedure: APPLICATION OF WOUND VAC;  Surgeon: Katha Cabal, MD;  Location: [facility];  Service: Vascular;  Laterality: Left;  . AV  FISTULA PLACEMENT  2012   BELIEVED WAS PLACED IN JUNE  . AV FISTULA PLACEMENT Right 08/09/2017   Procedure: Creation Right arm ARTERIOVENOUS BRACHIOCEPOHALIC FISTULA;  Surgeon: Elam Dutch, MD;  Location: [place];  Service: Vascular;  Laterality: Right;  . COLONOSCOPY    . CORONARY STENT INTERVENTION N/A 02/22/2017   Procedure: CORONARY STENT INTERVENTION;  Surgeon: Nigel Mormon, MD;  Location: [place] [facility];  Service: Cardiovascular;  Laterality: N/A;  . FLEXIBLE SIGMOIDOSCOPY N/A 07/15/2017   Procedure: FLEXIBLE SIGMOIDOSCOPY;  Surgeon: Carol Ada, MD;  Location: [place];  Service: Endoscopy;  Laterality: N/A;  . HEMORRHOID BANDING     . I&D EXTREMITY Left 06/01/2017   Procedure: IRRIGATION AND DEBRIDEMENT LEFT ARM HEMATOMA WITH LIGATION OF LEFT ARM AV FISTULA;  Surgeon: Elam Dutch, MD;  Location: [place];  Service: Vascular;  Laterality: Left;  . I&D EXTREMITY Left 06/14/2017   Procedure: IRRIGATION AND DEBRIDEMENT EXTREMITY;  Surgeon: Katha Cabal, MD;  Location: [facility];  Service: Vascular;  Laterality: Left;  . INSERTION OF DIALYSIS CATHETER  05/30/2017  . INSERTION OF DIALYSIS CATHETER N/A 05/30/2017   Procedure: INSERTION OF DIALYSIS CATHETER;  Surgeon: Elam Dutch, MD;  Location: [place];  Service: Vascular;  Laterality: N/A;  . IR PARACENTESIS  08/30/2017  . IR PARACENTESIS  09/29/2017  . LEFT HEART CATH AND CORONARY ANGIOGRAPHY N/A 02/22/2017   Procedure: LEFT HEART CATH AND CORONARY ANGIOGRAPHY;  Surgeon: Nigel Mormon, MD;  Location: [place] [facility];  Service: Cardiovascular;  Laterality: N/A;  . LIGATION OF ARTERIOVENOUS  FISTULA Left 08/20/5282   Procedure: Plication of Left Arm Arteriovenous Fistula;  Surgeon: Elam Dutch, MD;  Location: [place];  Service: Vascular;  Laterality: Left;  . POLYPECTOMY    . REVISON OF ARTERIOVENOUS FISTULA Left 1/32/4401   Procedure: PLICATION OF DISTAL ANEURYSMAL SEGEMENT OF LEFT UPPER ARM ARTERIOVENOUS FISTULA;  Surgeon: Elam Dutch, MD;  Location: [place];  Service: Vascular;  Laterality: Left;  . REVISON OF ARTERIOVENOUS FISTULA Left 0/27/2536   Procedure: Plication of Left Upper Arm Fistula ;  Surgeon: Waynetta Sandy, MD;  Location: [place];  Service: Vascular;  Laterality: Left;  . SKIN GRAFT SPLIT THICKNESS LEG / FOOT Left    SKIN GRAFT SPLIT THICKNESS LEFT ARM DONOR SITE: LEFT ANTERIOR THIGH  . SKIN SPLIT GRAFT Left 07/04/2017   Procedure: SKIN GRAFT SPLIT THICKNESS LEFT ARM DONOR SITE: LEFT ANTERIOR THIGH;  Surgeon: Elam Dutch, MD;  Location: [place];  Service: Vascular;  Laterality: Left;  . THROMBECTOMY W/ EMBOLECTOMY Left  06/05/2017   Procedure: EXPLORATION OF LEFT ARM FOR BLEEDING; OVERSEWED PROXIMAL FISTULA;  Surgeon: Angelia Mould, MD;  Location: [place];  Service: Vascular;  Laterality: Left;  . WOUND EXPLORATION Left 06/03/2017   Procedure: WOUND EXPLORATION WITH WOUND VAC APPLICATION TO LEFT ARM;  Surgeon: Angelia Mould, MD;  Location: [place];  Service: Vascular;  Laterality: Left;     reports that he has been smoking cigarettes.  He started smoking about 44 years ago. He has a 21.50 pack-year smoking history. He has never used smokeless tobacco. He reports that he drinks alcohol. He reports that he has current or past drug history. Drugs: Marijuana and Cocaine.  Allergies  Allergen Reactions  . Aspirin Other (See Comments)    STOMACH PAIN  . Clonidine Derivatives Itching  . Tramadol Itching    Family History  Problem Relation Age of Onset  . Heart  disease Mother   . Lung cancer Mother   . Heart disease Father   . Malignant hyperthermia Father   . COPD Father   . Throat cancer Sister   . Esophageal cancer Sister   . Hypertension Other   . COPD Other   . Colon cancer Neg Hx   . Colon polyps Neg Hx   . Rectal cancer Neg Hx   . Stomach cancer Neg Hx     Prior to Admission medications   Medication Sig Start Date End Date Taking? Authorizing Provider  albuterol (VENTOLIN HFA) 108 (90 Base) MCG/ACT inhaler Inhale 2 puffs into the lungs every 6 (six) hours as needed for wheezing or shortness of breath.   Yes [provider]  budesonide-formoterol (SYMBICORT) 80-4.5 MCG/ACT inhaler Inhale 2 puffs into the lungs 2 (two) times daily. 10/13/17  Yes Tanda Rockers, MD  clopidogrel (PLAVIX) 75 MG tablet Take 1 tablet (75 mg total) by mouth daily. 07/15/17  Yes Rhyne, Hulen Shouts, PA-C  diltiazem (CARDIZEM CD) 120 MG 24 hr capsule Take 1 capsule (120 mg total) by mouth daily. 06/23/17 06/23/18 Yes Shahmehdi, Valeria Batman, MD  famotidine (PEPCID) 20 MG tablet One at bedtime 09/08/17  Yes  Tanda Rockers, MD  gabapentin (NEURONTIN) 100 MG capsule Take 1 capsule (100 mg total) by mouth at bedtime. 09/30/17  Yes Mariel Aloe, MD  hydrALAZINE (APRESOLINE) 100 MG tablet Take 1 tablet by mouth twice a day take after HD on dialysis days 08/31/17  Yes [provider]  isosorbide mononitrate (IMDUR) 30 MG 24 hr tablet Take 1 tablet (30 mg total) by mouth daily. 06/17/17  Yes [name], MD  Nutritional Supplements (FEEDING SUPPLEMENT, NEPRO CARB STEADY,) LIQD Take 237 mLs by mouth 2 (two) times daily between meals. 09/30/17  Yes Mariel Aloe, MD  oxyCODONE (OXY IR/ROXICODONE) 5 MG immediate release tablet Take 5 mg by mouth 2 (two) times daily as needed for moderate pain.  09/22/17  Yes [provider]  pantoprazole (PROTONIX) 40 MG tablet Take 1 tablet (40 mg total) by mouth daily. Take 30-60 min before first meal of the day 09/08/17  Yes Tanda Rockers, MD    Physical Exam: Vitals:   10/14/17 0018 10/14/17 0712 10/14/17 0714 10/14/17 0732  BP: (!) 192/90 (!) 142/93 (!) 142/93   Pulse: (!) 120 (!) 124 (!) 123 (!) 123  Resp: 16 15 15 15   Temp: 98.6 F (37 C) 98.6 F (37 C)  98.2 F (36.8 C)  TempSrc: Oral Oral  Oral  SpO2: 97% 93% 96% 96%    Constitutional:  NAD, calm, comfortable Eyes: PERRL, lids and conjunctivae normal ENMT:  Moist mucous membranes.  Oropharynx nonerythematous, no exudates.   Neck:  No nuchal rigidity, no masses Respiratory: Diffuse wheezing, no focal rales or rhonchi, no respiratory distress or accessory muscle use Cardiovascular: Tachycardic, regular rhythm, 3 out of 6 systolic ejection murmur which may be radiating from his right arm.  2+ radial pulses.  Hemodialysis catheter in the right chest Abdomen:  Normal active bowel sounds, soft, mildly distended, tender to palpation in the periumbilical area without rebound or guarding  Musculoskeletal: Normal muscle tone and bulk.  No contractures.  Skin:  no rashes, abrasions, or ulcers.   Evidence of fistulas/grafts both arms. Neurologic:  CN 2-12 grossly intact. Sensation intact to light touch, strength 5/5 throughout Psychiatric:  Alert and oriented x 3. Normal affect.   Labs on Admission: I have personally reviewed following labs  and imaging studies  CBC: Recent Labs  Lab 10/14/17 0507  WBC 11.5*  NEUTROABS 8.8*  HGB 10.8*  HCT 31.9*  MCV 80.4  PLT 374*   Basic Metabolic Panel: Recent Labs  Lab 10/14/17 0507  NA 135  K 5.2*  CL 93*  CO2 29  GLUCOSE 91  BUN 48*  CREATININE 6.95*  CALCIUM 9.8  MG 2.6*   GFR: Estimated Creatinine Clearance: 11.9 mL/min (A) (by C-G formula based on SCr of 6.95 mg/dL (H)). Liver Function Tests: Recent Labs  Lab 10/14/17 0507  AST 30  ALT 27  ALKPHOS 174*  BILITOT 0.9  PROT 7.3  ALBUMIN 3.6   Recent Labs  Lab 10/14/17 0507  LIPASE 27   No results for input(s): AMMONIA in the last 168 hours. Coagulation Profile: Recent Labs  Lab 10/14/17 0507  INR 1.14   Cardiac Enzymes: No results for input(s): CKTOTAL, CKMB, CKMBINDEX, TROPONINI in the last 168 hours. BNP (last 3 results) No results for input(s): PROBNP in the last 8760 hours. HbA1C: No results for input(s): HGBA1C in the last 72 hours. CBG: No results for input(s): GLUCAP in the last 168 hours. Lipid Profile: No results for input(s): CHOL, HDL, LDLCALC, TRIG, CHOLHDL, LDLDIRECT in the last 72 hours. Thyroid Function Tests: No results for input(s): TSH, T4TOTAL, FREET4, T3FREE, THYROIDAB in the last 72 hours. Anemia Panel: No results for input(s): VITAMINB12, FOLATE, FERRITIN, TIBC, IRON, RETICCTPCT in the last 72 hours. Urine analysis:    Component Value Date/Time   COLORURINE YELLOW 07/16/2011 1619   APPEARANCEUR CLEAR 07/16/2011 1619   LABSPEC 1.018 07/16/2011 1619   PHURINE 7.5 07/16/2011 1619   GLUCOSEU 250 (A) 07/16/2011 1619   HGBUR MODERATE (A) 07/16/2011 1619   BILIRUBINUR SMALL (A) 07/16/2011 1619   KETONESUR NEGATIVE 07/16/2011  1619   PROTEINUR >300 (A) 07/16/2011 1619   UROBILINOGEN 1.0 07/16/2011 1619   NITRITE NEGATIVE 07/16/2011 1619   LEUKOCYTESUR SMALL (A) 07/16/2011 1619   Sepsis Labs: @LABRCNTIP (procalcitonin:4,lacticidven:4) )No results found for this or any previous visit (from the past 240 hour(s)).   Radiological Exams on Admission: No results found.  EKG: Independently reviewed.  Sinus tachycardia with left bundle branch block, T wave inversions in V5-V6 which are stable from prior.  He has some T wave peaking in V2 through V4.  QTC is 578, prolonged.  Assessment/Plan Active Problems:   Ascites   Abdominal pain, may be secondary to ascites, although his abdominal exam does not demonstrate tense ascites that would cause this degree of pain.  Alternatively he may have SBP and he also describes some symptoms of constipation.  LFTs and lipase are normal. - Ultrasound-guided diagnostic and therapeutic paracentesis ordered:  Bloody fluid similar to last paracentesis -  Previous cytology negative, but will repeat  -  Had CT angio in Jan and repeat CT WO contrast of abd in March w/o evidence of malignancy -  INR wnl -  Repeat CBC this afternoon to verify hgb stable - Previous SAAG has been c/w portal hypertension so did not repeat - Cell count and fluid culture ordered - Lactulose 20 g once for constipation -  Counseled patient to stop using magnesium containing medications for bowel relief -  Hold plavix and heparin  Cirrhosis secondary to hepatitis B, hepatitis C, and alcoholism, evidence of cirrhosis seen on CT scan and ascites is consistent with portal hypertension.  Spleen appears normal. -May need to have his dry weight decreased at hemodialysis to prevent recurrent fluid accumulation -  had paracentesis in February and again in March - f/u with GI  End-stage renal disease, usually Monday, Wednesday, Friday -Transfer to [name] for nonemergent hemodialysis either today or tomorrow  Sinus  tachycardia, may be related to pain or not receiving home medications.  History of a-fib/a-flutter status post cardioversion. -  Resume diltiazem -  Treat pain -  Monitor on tele 2/2 hx of a-flutter but rate/appearance on tele currently inconsistent with flutter -  Not on anticoagulation due to medication nonadherence  CAD status post mid RCA PCI August 2018 -  Hold plavix (has been on more than 6 months post stent) -  Continue CCB and imdur  Essential hypertension, blood pressure elevated -Resume diltiazem, hydralazine  Moderate to severe mitral stenosis, may be contributing to dyspnea -Follow-up with cardiology  COPD s/p recent acute exacerbation - continue LABA/ICS -  Duonebs prn  Polysubstance abuse including cigarette nicotine dependence -Counseled cessation -Patient declined nicotine patch  Recent GI bleed secondary to rectal ulcer -Hemoglobin appears to have stabilized, patient does not report any evidence of rectal bleeding  Anemia of renal disease, hemoglobin stable from prior,   DVT prophylaxis: Heparin Code Status: Full code Family Communication:  Patient alone Disposition Plan: Home in 1-2 days Consults called: Nephrology Admission status: Observation on telemetry  Janece Canterbury MD [facility] Pager [phone]  If 7PM-7AM, please contact night-coverage [email or web address] Password [facility]  10/14/2017, 8:38 AM

## 2017-10-14 NOTE — ED Triage Notes (Addendum)
Pt from home with c/o lower abdominal pain and distention. Pt states pain is worse when he is on his left side. Pt states pain is sharp. Pt is a dialysis patient. Pt has an appointment on the 11th with a GI specialist. Pt's LBM was yesterday. Pt has hypoactive bowel sounds   168palpated HR 96 CBG 116 98% RA

## 2017-10-14 NOTE — ED Provider Notes (Signed)
[facility] DEPT Provider Note   CSN: [id number] Arrival date & time: 10/14/17  0011     History   Chief Complaint Chief Complaint  Patient presents with  . Abdominal Pain    HPI [name] is a 57 y.o. male.  The history is provided by the patient.  He has a history of hypertension coronary artery disease, COPD, hepatitis C, prolonged QT interval, end-stage renal disease on hemodialysis and comes in complaining of abdominal distention and abdominal pain.  He had been in the hospital 2 weeks ago for ascites and had paracentesis done.  Abdomen has been swelling since discharge and started becoming painful 2 days ago.  He is complaining of periumbilical pain which he rates a 10/10.  There is been some nausea but no vomiting.  He denies fever or chills.  He does complain of constipation.  Pain is worse with pressing on his abdomen.  Nothing makes it better.  He does admit to having had some alcohol since discharge, but he states that this is his first drink in over a year.  Past Medical History:  Diagnosis Date  . Adenomatous colon polyp    tubular  . Anemia   . Anxiety   . Arthritis    left shoulder  . Atherosclerosis of aorta ([place])   . Cardiomegaly   . Chest pain    DATE UNKNOWN, C/O PERIODICALLY  . Cocaine abuse ([place])   . COPD exacerbation ([place]) 08/17/2016  . Coronary artery disease    stent 02/22/17  . Dialysis patient ([place])    Monday-Wednesday-Friday  . ESRD (end stage renal disease) on dialysis ([place])    "[name]; MWF" (07/04/2017)  . GERD (gastroesophageal reflux disease)    DATE UNKNOWN  . Hemorrhoids   . Hepatitis B, chronic ([place])   . Hepatitis C   . History of kidney stones   . Hyperkalemia   . Hypertension   . Metabolic bone disease    Patient denies  . Myocardial infarction ([place])   . Pneumonia   . Pulmonary edema   . Renal disorder   . Renal insufficiency   . Shortness of breath dyspnea    " for the last past  year with this dialysis"  . Solitary rectal ulcer syndrome   . Tubular adenoma of colon     Patient Active Problem List   Diagnosis Date Noted  . Elevated diaphragm 10/13/2017  . Ileus ([place]) 09/29/2017  . QT prolongation 09/29/2017  . Malnutrition of moderate degree 09/29/2017  . Sinus congestion 09/03/2017  . Symptomatic anemia 09/02/2017  . Other cirrhosis of liver ([place]) 09/02/2017  . Left bundle branch block 09/02/2017  . Mitral stenosis 09/02/2017  . Hematochezia 07/15/2017  . Wide-complex tachycardia ([place])   . Endotracheally intubated   . ESRD (end stage renal disease) ([place]) 07/04/2017  . Acute respiratory failure with hypoxia ([place]) 06/18/2017  . CKD (chronic kidney disease) stage V requiring chronic dialysis ([place]) 06/18/2017  . History of Cocaine abuse ([place]) 06/18/2017  . Hypertension 06/18/2017  . Infection of AV graft for dialysis ([place]) 06/18/2017  . Anxiety 06/18/2017  . Anemia due to chronic kidney disease 06/18/2017  . Atrial flutter with rapid ventricular response ([place]) 06/18/2017  . Personality disorder ([place]) 06/13/2017  . Cellulitis 06/12/2017  . Adjustment disorder with mixed anxiety and depressed mood 06/10/2017  . Suicidal ideation 06/10/2017  . Arm wound, left, sequela 06/10/2017  . Dyspnea on exertion 05/29/2017  . Tachycardia 05/29/2017  .  Hyperkalemia 05/22/2017  . Acute metabolic encephalopathy   . Anemia 04/23/2017  . Ascites 04/23/2017  . COPD (chronic obstructive pulmonary disease) ([place]) 04/23/2017  . Acute on chronic respiratory failure with hypoxia ([place]) 03/25/2017  . Arrhythmia 03/25/2017  . COPD GOLD II/ still smoking 09/27/2016  . Essential hypertension 09/27/2016  . Fluid overload 08/30/2016  . COPD exacerbation ([place]) 08/17/2016  . Hypertensive urgency 08/17/2016  . Respiratory failure ([place]) 08/17/2016  . Problem with dialysis access ([place]) 07/23/2016  . End stage renal disease ([place]) 12/02/2015  . Chronic hepatitis B ([place]) 03/05/2014  .  Chronic hepatitis C without hepatic coma ([place]) 03/05/2014  . Internal hemorrhoids with bleeding, swelling and itching 03/05/2014  . Thrombocytopenia ([place]) 03/05/2014  . Chest pain 02/27/2014  . Alcohol abuse 04/14/2009  . Cigarette smoker 04/14/2009  . GANGLION CYST 04/14/2009    Past Surgical History:  Procedure Laterality Date  . A/V FISTULAGRAM Left 05/26/2017   Procedure: A/V FISTULAGRAM;  Surgeon: Conrad Green Hill, MD;  Location: [facility];  Service: Cardiovascular;  Laterality: Left;  . APPLICATION OF WOUND VAC Left 06/14/2017   Procedure: APPLICATION OF WOUND VAC;  Surgeon: Katha Cabal, MD;  Location: [facility];  Service: Vascular;  Laterality: Left;  . AV FISTULA PLACEMENT  2012   BELIEVED WAS PLACED IN JUNE  . AV FISTULA PLACEMENT Right 08/09/2017   Procedure: Creation Right arm ARTERIOVENOUS BRACHIOCEPOHALIC FISTULA;  Surgeon: Elam Dutch, MD;  Location: [facility];  Service: Vascular;  Laterality: Right;  . COLONOSCOPY    . CORONARY STENT INTERVENTION N/A 02/22/2017   Procedure: CORONARY STENT INTERVENTION;  Surgeon: Nigel Mormon, MD;  Location: [facility];  Service: Cardiovascular;  Laterality: N/A;  . FLEXIBLE SIGMOIDOSCOPY N/A 07/15/2017   Procedure: FLEXIBLE SIGMOIDOSCOPY;  Surgeon: Carol [place], MD;  Location: [facility];  Service: Endoscopy;  Laterality: N/A;  . HEMORRHOID BANDING    . I&D EXTREMITY Left 06/01/2017   Procedure: IRRIGATION AND DEBRIDEMENT LEFT ARM HEMATOMA WITH LIGATION OF LEFT ARM AV FISTULA;  Surgeon: Elam Dutch, MD;  Location: [facility];  Service: Vascular;  Laterality: Left;  . I&D EXTREMITY Left 06/14/2017   Procedure: IRRIGATION AND DEBRIDEMENT EXTREMITY;  Surgeon: Katha Cabal, MD;  Location: [facility];  Service: Vascular;  Laterality: Left;  . INSERTION OF DIALYSIS CATHETER  05/30/2017  . INSERTION OF DIALYSIS CATHETER N/A 05/30/2017   Procedure: INSERTION OF DIALYSIS CATHETER;  Surgeon: Elam Dutch, MD;   Location: [facility];  Service: Vascular;  Laterality: N/A;  . IR PARACENTESIS  08/30/2017  . IR PARACENTESIS  09/29/2017  . LEFT HEART CATH AND CORONARY ANGIOGRAPHY N/A 02/22/2017   Procedure: LEFT HEART CATH AND CORONARY ANGIOGRAPHY;  Surgeon: Nigel Mormon, MD;  Location: [facility];  Service: Cardiovascular;  Laterality: N/A;  . LIGATION OF ARTERIOVENOUS  FISTULA Left 03/17/931   Procedure: Plication of Left Arm Arteriovenous Fistula;  Surgeon: Elam Dutch, MD;  Location: [facility];  Service: Vascular;  Laterality: Left;  . POLYPECTOMY    . REVISON OF ARTERIOVENOUS FISTULA Left 6/71/2458   Procedure: PLICATION OF DISTAL ANEURYSMAL SEGEMENT OF LEFT UPPER ARM ARTERIOVENOUS FISTULA;  Surgeon: Elam Dutch, MD;  Location: [facility];  Service: Vascular;  Laterality: Left;  . REVISON OF ARTERIOVENOUS FISTULA Left 0/99/8338   Procedure: Plication of Left Upper Arm Fistula ;  Surgeon: Waynetta Sandy, MD;  Location: [facility];  Service: Vascular;  Laterality: Left;  . SKIN GRAFT SPLIT  THICKNESS LEG / FOOT Left    SKIN GRAFT SPLIT THICKNESS LEFT ARM DONOR SITE: LEFT ANTERIOR THIGH  . SKIN SPLIT GRAFT Left 07/04/2017   Procedure: SKIN GRAFT SPLIT THICKNESS LEFT ARM DONOR SITE: LEFT ANTERIOR THIGH;  Surgeon: Elam Dutch, MD;  Location: [facility];  Service: Vascular;  Laterality: Left;  . THROMBECTOMY W/ EMBOLECTOMY Left 06/05/2017   Procedure: EXPLORATION OF LEFT ARM FOR BLEEDING; OVERSEWED PROXIMAL FISTULA;  Surgeon: Angelia Mould, MD;  Location: [facility];  Service: Vascular;  Laterality: Left;  . WOUND EXPLORATION Left 06/03/2017   Procedure: WOUND EXPLORATION WITH WOUND VAC APPLICATION TO LEFT ARM;  Surgeon: Angelia Mould, MD;  Location: [facility];  Service: Vascular;  Laterality: Left;        Home Medications    Prior to Admission medications   Medication Sig Start Date End Date Taking? Authorizing Provider  albuterol (VENTOLIN HFA) 108 (90 Base) MCG/ACT inhaler  Inhale 2 puffs into the lungs every 6 (six) hours as needed for wheezing or shortness of breath.    [provider]  budesonide-formoterol (SYMBICORT) 80-4.5 MCG/ACT inhaler Inhale 2 puffs into the lungs 2 (two) times daily. 10/13/17   Tanda Rockers, MD  clopidogrel (PLAVIX) 75 MG tablet Take 1 tablet (75 mg total) by mouth daily. 07/15/17   Rhyne, Hulen Shouts, PA-C  diltiazem (CARDIZEM CD) 120 MG 24 hr capsule Take 1 capsule (120 mg total) by mouth daily. 06/23/17 06/23/18  Deatra James, MD  famotidine (PEPCID) 20 MG tablet One at bedtime 09/08/17   Tanda Rockers, MD  gabapentin (NEURONTIN) 100 MG capsule Take 1 capsule (100 mg total) by mouth at bedtime. 09/30/17   Mariel Aloe, MD  hydrALAZINE (APRESOLINE) 100 MG tablet Take 1 tablet by mouth twice a day take after HD on dialysis days 08/31/17   [provider]  isosorbide mononitrate (IMDUR) 30 MG 24 hr tablet Take 1 tablet (30 mg total) by mouth daily. 06/17/17   [name], MD  Nutritional Supplements (FEEDING SUPPLEMENT, NEPRO CARB STEADY,) LIQD Take 237 mLs by mouth 2 (two) times daily between meals. 09/30/17   Mariel Aloe, MD  oxyCODONE (OXY IR/ROXICODONE) 5 MG immediate release tablet Take 5 mg by mouth 2 (two) times daily as needed. 09/22/17   [provider]  pantoprazole (PROTONIX) 40 MG tablet Take 1 tablet (40 mg total) by mouth daily. Take 30-60 min before first meal of the day 09/08/17   Tanda Rockers, MD    Family History Family History  Problem Relation Age of Onset  . Heart disease Mother   . Lung cancer Mother   . Heart disease Father   . Malignant hyperthermia Father   . COPD Father   . Throat cancer Sister   . Esophageal cancer Sister   . Hypertension Other   . COPD Other   . Colon cancer Neg Hx   . Colon polyps Neg Hx   . Rectal cancer Neg Hx   . Stomach cancer Neg Hx     Social History Social History   Tobacco Use  . Smoking status: Current Every Day Smoker    Packs/day:  0.50    Years: 43.00    Pack years: 21.50    Types: Cigarettes    Start date: 08/13/1973  . Smokeless tobacco: Never Used  Substance Use Topics  . Alcohol use: Yes    Frequency: Never    Comment: quit drinking in 2017  . Drug use:  Yes    Types: Marijuana, Cocaine    Comment: quit in 2017"     Allergies   Aspirin; Clonidine derivatives; and Tramadol   Review of Systems Review of Systems  All other systems reviewed and are negative.    Physical Exam Updated Vital Signs BP (!) 192/90 (BP Location: Left Arm)   Pulse (!) 120   Temp 98.6 F (37 C) (Oral)   Resp 16   SpO2 97%   Physical Exam  Nursing note and vitals reviewed.  57 year old male, resting comfortably and in no acute distress. Vital signs are significant for elevated blood pressure and heart rate. Oxygen saturation is 97%, which is normal. Head is normocephalic and atraumatic. PERRLA, EOMI. Oropharynx is clear. Neck is nontender and supple without adenopathy or JVD. Back is nontender and there is no CVA tenderness. Lungs are clear without rales, wheezes, or rhonchi. Chest is nontender.  Dialysis catheters are present in the right subclavian area. Heart is tachycardic with 0-8/6 systolic ejection murmur heard at the lower left sternal border. Abdomen is soft, protuberant, with mild periumbilical tenderness.  There is no rebound or guarding.  Large amount of ascites is present as evidenced by positive fluid wave.  There are no masses or hepatosplenomegaly and peristalsis is normoactive. Extremities have no cyanosis or edema, full range of motion is present.  Clotted AV shunt present in left arm. Skin is warm and dry without rash. Neurologic: Mental status is normal, cranial nerves are intact, there are no motor or sensory deficits.  ED Treatments / Results  Labs (all labs ordered are listed, but only abnormal results are displayed) Labs Reviewed  COMPREHENSIVE METABOLIC PANEL - Abnormal; Notable for the  following components:      Result Value   Potassium 5.2 (*)    Chloride 93 (*)    BUN 48 (*)    Creatinine, Ser 6.95 (*)    Alkaline Phosphatase 174 (*)    GFR calc non Af Amer 8 (*)    GFR calc Af Amer 9 (*)    All other components within normal limits  CBC WITH DIFFERENTIAL/PLATELET - Abnormal; Notable for the following components:   WBC 11.5 (*)    RBC 3.97 (*)    Hemoglobin 10.8 (*)    HCT 31.9 (*)    RDW 20.6 (*)    Platelets 121 (*)    Neutro Abs 8.8 (*)    All other components within normal limits  MAGNESIUM - Abnormal; Notable for the following components:   Magnesium 2.6 (*)    All other components within normal limits  LIPASE, BLOOD  PROTIME-INR    EKG EKG Interpretation  Date/Time:  Friday October 14 2017 06:24:31 EDT Ventricular Rate:  121 PR Interval:    QRS Duration: 153 QT Interval:  407 QTC Calculation: 578 R Axis:   -144 Text Interpretation:  Sinus tachycardia Consider left ventricular hypertrophy Abnormal T, consider ischemia, lateral leads Tall T, consider metabolic/ischemic abnrm Prolonged QT interval When compared with ECG of 10/08/2017, No significant change was found Confirmed by Delora Fuel ([id number]) on 10/14/2017 6:32:10 AM    Procedures Procedures   Medications Ordered in ED Medications  morphine 4 MG/ML injection 4 mg (has no administration in time range)  prochlorperazine (COMPAZINE) injection 10 mg (has no administration in time range)     Initial Impression / Assessment and Plan / ED Course  I have reviewed the triage vital signs and the nursing notes.  Pertinent lab results  that were available during my care of the patient were reviewed by me and considered in my medical decision making (see chart for details).  Recurrent ascites with abdominal pain.  No findings to suggest spontaneous bacterial peritonitis.  Old records are reviewed confirming recent hospitalization for abdominal pain and ascites.  Will check screening labs.  Anticipate  need for admission and elective paracentesis.  Labs show mildly elevated WBC with out left shift.  Mild thrombocytopenia present-presumably from underlying liver disease.  Anemia is present and not significantly changed from baseline.  Case is discussed with Dr. Alcario Drought of [facility] hospital[facility], who agrees to admit the patient.  Final Clinical Impressions(s) / ED Diagnoses   Final diagnoses:  Ascites due to alcoholic cirrhosis ([place])  Abdominal pain, unspecified abdominal location  End-stage renal disease on hemodialysis ([place])  Anemia associated with chronic renal failure    ED Discharge Orders    None       Delora Fuel, MD 46/56/81 [phone]

## 2017-10-14 NOTE — ED Notes (Signed)
PT DECLINES TO WEAR CARDIAC LEADS AND MONITOR. HE STATES THEY ITCH. PT DECLINES TO [facility] GOWN. HE WANTS TO WEAR HIS CLOTHING. HE HINTS TO WANTING TO GO OUTSIDE TO SMOKE. ENCOURAGE PT TO [facility] GOWN AND CARDIAC MONITOR FOR HIS SAFETY AND CONTINUED PT CARE. PT DECLINES AT THIS TIME. PT CONTINUES TO SIT IN PT CHAIR WITHOUT EVENT.

## 2017-10-14 NOTE — ED Notes (Addendum)
EKG given to EDP,Glick,MD., Per NT, Deandra.

## 2017-10-14 NOTE — ED Notes (Signed)
PT AWARE OF ADMISSION AND ULTRASOUND

## 2017-10-14 NOTE — Progress Notes (Signed)
Transfer  Pt received from [facility] via EMS. Pt states he does not have any excess fluid on him and does not know why he was admitted. He states that if he was not taken to HD in two hours, he would leave AMA. HD was notified of pt's arrival.

## 2017-10-14 NOTE — Consult Note (Signed)
Renal Service Consult Note [facility]  [name] [facility] 10/14/2017 [name] Requesting Physician:  Dr Sheran Fava, M.   Reason for Consult:  ESRD pt w recurrent ascites HPI: The patient is a 57 y.o. year-old with hx of HTN, COPD, hep B, ESRD on HD who presented with abd pain to ED. Was admitted and underwent paracentesis of 3.5L w/ return of bloody fluid.  Per admitting ED has had recent cytology and CT scans w/o signs of malignancy.  We are asked to see for ESRD.    Pt has been going to all his HD treatments, but does sign off early frequently.  No SOB, cough, no fevers, abd pain better after fluid removal today.  No fevers or night sweats.  Has a maturing R arm AVF and a TDC for HD.     ROS  denies CP  no joint pain   no HA  no blurry vision  no rash  no diarrhea  no nausea/ vomiting   Past Medical History  Past Medical History:  Diagnosis Date  . Adenomatous colon polyp    tubular  . Anemia   . Anxiety   . Arthritis    left shoulder  . Atherosclerosis of aorta ([place])   . Cardiomegaly   . Chest pain    DATE UNKNOWN, C/O PERIODICALLY  . Cocaine abuse ([place])   . COPD exacerbation ([place]) 08/17/2016  . Coronary artery disease    stent 02/22/17  . Dialysis patient ([place])    Monday-Wednesday-Friday  . ESRD (end stage renal disease) on dialysis ([place])    "[name]; MWF" (07/04/2017)  . GERD (gastroesophageal reflux disease)    DATE UNKNOWN  . Hemorrhoids   . Hepatitis B, chronic ([place])   . Hepatitis C   . History of kidney stones   . Hyperkalemia   . Hypertension   . Metabolic bone disease    Patient denies  . Myocardial infarction ([place])   . Pneumonia   . Pulmonary edema   . Renal disorder   . Renal insufficiency   . Shortness of breath dyspnea    " for the last past year with this dialysis"  . Solitary rectal ulcer syndrome   . Tubular adenoma of colon    Past Surgical History  Past Surgical History:  Procedure Laterality Date  . A/V  FISTULAGRAM Left 05/26/2017   Procedure: A/V FISTULAGRAM;  Surgeon: Conrad [place], MD;  Location: [place] [facility];  Service: Cardiovascular;  Laterality: Left;  . APPLICATION OF WOUND VAC Left 06/14/2017   Procedure: APPLICATION OF WOUND VAC;  Surgeon: Katha Cabal, MD;  Location: [facility];  Service: Vascular;  Laterality: Left;  . AV FISTULA PLACEMENT  2012   BELIEVED WAS PLACED IN JUNE  . AV FISTULA PLACEMENT Right 08/09/2017   Procedure: Creation Right arm ARTERIOVENOUS BRACHIOCEPOHALIC FISTULA;  Surgeon: Elam Dutch, MD;  Location: [place];  Service: Vascular;  Laterality: Right;  . COLONOSCOPY    . CORONARY STENT INTERVENTION N/A 02/22/2017   Procedure: CORONARY STENT INTERVENTION;  Surgeon: Nigel Mormon, MD;  Location: [place] [facility];  Service: Cardiovascular;  Laterality: N/A;  . FLEXIBLE SIGMOIDOSCOPY N/A 07/15/2017   Procedure: FLEXIBLE SIGMOIDOSCOPY;  Surgeon: Carol Ada, MD;  Location: [place];  Service: Endoscopy;  Laterality: N/A;  . HEMORRHOID BANDING    . I&D EXTREMITY Left 06/01/2017   Procedure: IRRIGATION AND DEBRIDEMENT LEFT ARM HEMATOMA WITH LIGATION OF LEFT ARM AV FISTULA;  Surgeon: Oneida Alar,  Jessy Oto, MD;  Location: [place];  Service: Vascular;  Laterality: Left;  . I&D EXTREMITY Left 06/14/2017   Procedure: IRRIGATION AND DEBRIDEMENT EXTREMITY;  Surgeon: Katha Cabal, MD;  Location: [facility];  Service: Vascular;  Laterality: Left;  . INSERTION OF DIALYSIS CATHETER  05/30/2017  . INSERTION OF DIALYSIS CATHETER N/A 05/30/2017   Procedure: INSERTION OF DIALYSIS CATHETER;  Surgeon: Elam Dutch, MD;  Location: [place];  Service: Vascular;  Laterality: N/A;  . IR PARACENTESIS  08/30/2017  . IR PARACENTESIS  09/29/2017  . LEFT HEART CATH AND CORONARY ANGIOGRAPHY N/A 02/22/2017   Procedure: LEFT HEART CATH AND CORONARY ANGIOGRAPHY;  Surgeon: Nigel Mormon, MD;  Location: [place] [facility];  Service: Cardiovascular;  Laterality: N/A;  .  LIGATION OF ARTERIOVENOUS  FISTULA Left 10/16/4257   Procedure: Plication of Left Arm Arteriovenous Fistula;  Surgeon: Elam Dutch, MD;  Location: [place];  Service: Vascular;  Laterality: Left;  . POLYPECTOMY    . REVISON OF ARTERIOVENOUS FISTULA Left 5/63/8756   Procedure: PLICATION OF DISTAL ANEURYSMAL SEGEMENT OF LEFT UPPER ARM ARTERIOVENOUS FISTULA;  Surgeon: Elam Dutch, MD;  Location: [place];  Service: Vascular;  Laterality: Left;  . REVISON OF ARTERIOVENOUS FISTULA Left 4/33/2951   Procedure: Plication of Left Upper Arm Fistula ;  Surgeon: Waynetta Sandy, MD;  Location: [place];  Service: Vascular;  Laterality: Left;  . SKIN GRAFT SPLIT THICKNESS LEG / FOOT Left    SKIN GRAFT SPLIT THICKNESS LEFT ARM DONOR SITE: LEFT ANTERIOR THIGH  . SKIN SPLIT GRAFT Left 07/04/2017   Procedure: SKIN GRAFT SPLIT THICKNESS LEFT ARM DONOR SITE: LEFT ANTERIOR THIGH;  Surgeon: Elam Dutch, MD;  Location: [place];  Service: Vascular;  Laterality: Left;  . THROMBECTOMY W/ EMBOLECTOMY Left 06/05/2017   Procedure: EXPLORATION OF LEFT ARM FOR BLEEDING; OVERSEWED PROXIMAL FISTULA;  Surgeon: Angelia Mould, MD;  Location: [place];  Service: Vascular;  Laterality: Left;  . WOUND EXPLORATION Left 06/03/2017   Procedure: WOUND EXPLORATION WITH WOUND VAC APPLICATION TO LEFT ARM;  Surgeon: Angelia Mould, MD;  Location: [facility];  Service: Vascular;  Laterality: Left;   Family History  Family History  Problem Relation Age of Onset  . Heart disease Mother   . Lung cancer Mother   . Heart disease Father   . Malignant hyperthermia Father   . COPD Father   . Throat cancer Sister   . Esophageal cancer Sister   . Hypertension Other   . COPD Other   . Colon cancer Neg Hx   . Colon polyps Neg Hx   . Rectal cancer Neg Hx   . Stomach cancer Neg Hx    Social History  reports that he has been smoking cigarettes.  He started smoking about 44 years ago. He has a 21.50 pack-year smoking  history. He has never used smokeless tobacco. He reports that he drinks alcohol. He reports that he has current or past drug history. Drugs: Marijuana and Cocaine. Allergies  Allergies  Allergen Reactions  . Aspirin Other (See Comments)    STOMACH PAIN  . Clonidine Derivatives Itching  . Tramadol Itching   Home medications Prior to Admission medications   Medication Sig Start Date End Date Taking? Authorizing Provider  albuterol (VENTOLIN HFA) 108 (90 Base) MCG/ACT inhaler Inhale 2 puffs into the lungs every 6 (six) hours as needed for wheezing or shortness of breath.   Yes [provider]  budesonide-formoterol (SYMBICORT) 80-4.5  MCG/ACT inhaler Inhale 2 puffs into the lungs 2 (two) times daily. 10/13/17  Yes Tanda Rockers, MD  clopidogrel (PLAVIX) 75 MG tablet Take 1 tablet (75 mg total) by mouth daily. 07/15/17  Yes Rhyne, Hulen Shouts, PA-C  diltiazem (CARDIZEM CD) 120 MG 24 hr capsule Take 1 capsule (120 mg total) by mouth daily. 06/23/17 06/23/18 Yes Shahmehdi, Valeria Batman, MD  famotidine (PEPCID) 20 MG tablet One at bedtime 09/08/17  Yes Tanda Rockers, MD  gabapentin (NEURONTIN) 100 MG capsule Take 1 capsule (100 mg total) by mouth at bedtime. 09/30/17  Yes Mariel Aloe, MD  hydrALAZINE (APRESOLINE) 100 MG tablet Take 1 tablet by mouth twice a day take after HD on dialysis days 08/31/17  Yes [provider]  isosorbide mononitrate (IMDUR) 30 MG 24 hr tablet Take 1 tablet (30 mg total) by mouth daily. 06/17/17  Yes [name], MD  Nutritional Supplements (FEEDING SUPPLEMENT, NEPRO CARB STEADY,) LIQD Take 237 mLs by mouth 2 (two) times daily between meals. 09/30/17  Yes Mariel Aloe, MD  oxyCODONE (OXY IR/ROXICODONE) 5 MG immediate release tablet Take 5 mg by mouth 2 (two) times daily as needed for moderate pain.  09/22/17  Yes [provider]  pantoprazole (PROTONIX) 40 MG tablet Take 1 tablet (40 mg total) by mouth daily. Take 30-60 min before first meal of the day  09/08/17  Yes Tanda Rockers, MD   Liver Function Tests Recent Labs  Lab 10/14/17 0507  AST 30  ALT 27  ALKPHOS 174*  BILITOT 0.9  PROT 7.3  ALBUMIN 3.6   Recent Labs  Lab 10/14/17 0507  LIPASE 27   CBC Recent Labs  Lab 10/14/17 0507  WBC 11.5*  NEUTROABS 8.8*  HGB 10.8*  HCT 31.9*  MCV 80.4  PLT 517*   Basic Metabolic Panel Recent Labs  Lab 10/14/17 0507  NA 135  K 5.2*  CL 93*  CO2 29  GLUCOSE 91  BUN 48*  CREATININE 6.95*  CALCIUM 9.8   Iron/TIBC/Ferritin/ %Sat    Component Value Date/Time   IRON 104 09/03/2017 0800   TIBC 343 09/03/2017 0800   FERRITIN 513 (H) 09/03/2017 0800   IRONPCTSAT 30 09/03/2017 0800    Vitals:   10/14/17 1532 10/14/17 1602 10/14/17 1627 10/14/17 1632  BP: 93/62 93/62 (!) 152/94   Pulse: (!) 117 (!) 110 (!) 114   Resp: 18 18 17    Temp:      TempSrc:      SpO2: 98% 100% 99%   Weight:    65.2 kg (143 lb 11.8 oz)   Exam Gen alert, thin AAM, no distress No rash, cyanosis or gangrene Sclera anicteric, throat clear  No jvd or bruits Chest clear bilat RRR no MRG Abd soft ntnd protuberant, +ascites,  +bs GU normal male MS no joint effusions or deformity Ext no LE edema, no wounds or ulcers Neuro is alert, Ox 3 , nf  RUA AVF+ bruit (07/31/17)/ R IJ TDC    Dialysis: [facility]WF [facility]h  70kg   2/2 bath  Hep none - calcitriol 0.5 ug - no mircera, last Hb 11.6 - venofer 100 mg, 9 more doses - senispar 180 po tiw w/ hd   Impression: 1  ABd pain / recurrent ascites - sp tap today 3 L bloody fluid. This recurrent ascites may not be responsive to dialysis, esp in someone not grossly vol overloaded.  With cirrhosis suspect that the ascites is 3rd spaced and not  directly treatable w/ HD.   2  ESRD - cont HD MWF 3  Volume - 5 kg under dry wt, no edema on exam, lungs clear. Keep even on HD tonight 4  MBD ckd - cont sensipar , other meds, very high pth 5  Anemia ckd - no need esa, last Hb > 11 6  COPD 7  Hep B+,  chronic   Plan - as above  [name] MD [employer] pager [phone]   10/14/2017, 4:43 PM

## 2017-10-14 NOTE — ED Notes (Signed)
Patient transported to Ultrasound 

## 2017-10-17 ENCOUNTER — Encounter: Payer: Self-pay | Admitting: Internal Medicine

## 2017-10-17 DIAGNOSIS — D631 Anemia in chronic kidney disease: Secondary | ICD-10-CM | POA: Diagnosis not present

## 2017-10-17 DIAGNOSIS — A499 Bacterial infection, unspecified: Secondary | ICD-10-CM | POA: Diagnosis not present

## 2017-10-17 DIAGNOSIS — R058 Other specified cough: Secondary | ICD-10-CM | POA: Insufficient documentation

## 2017-10-17 DIAGNOSIS — N186 End stage renal disease: Secondary | ICD-10-CM | POA: Diagnosis not present

## 2017-10-17 DIAGNOSIS — D509 Iron deficiency anemia, unspecified: Secondary | ICD-10-CM | POA: Diagnosis not present

## 2017-10-17 DIAGNOSIS — N2581 Secondary hyperparathyroidism of renal origin: Secondary | ICD-10-CM | POA: Diagnosis not present

## 2017-10-17 DIAGNOSIS — R05 Cough: Secondary | ICD-10-CM | POA: Insufficient documentation

## 2017-10-17 LAB — BODY FLUID CULTURE: Culture: NO GROWTH

## 2017-10-17 NOTE — Assessment & Plan Note (Signed)
For now best option:  Reduce the number of substances he inhales > d/c cigs/ spiriva/use lower strength of symbicort and max rx for gerd

## 2017-10-17 NOTE — Assessment & Plan Note (Signed)
[facility] AT  04/10/17  MM  Level 200  Spirometry 09/27/2016  FEV1 2.20 (70%)  Ratio 64 p am advair with typical curvature   - 09/08/2017    add spiriva 2pffs each am to symbicort 80 > no better - Spirometry 10/13/2017  FEV1 2.02 (64%)  Ratio 70 with no curvature/ insp flattening on symb/spiriva so rec change to just symb 80 2bid   He could certainly have ab but despite smoking he does not have signifiant copd so is best characterized as COPD 0/ AB and would look for other possibilities to explain his sob (see separate a/p)  - paradoxically, in setting of pseudowheeze he may actually do better on less meds, not more:  rec reduce symb to 80 2bid

## 2017-10-17 NOTE — Assessment & Plan Note (Signed)
>   3 min discussion I reviewed the Fletcher curve with the patient that basically indicates  if you quit smoking when your best day FEV1 is still well preserved (as is clearly  the case here)  it is highly unlikely you will progress to severe disease and informed the patient there was  no medication on the market that has proven to alter the curve/ its downward trajectory  or the likelihood of progression of their disease(unlike other chronic medical conditions such as atheroclerosis where we do think we can change the natural hx with risk reducing meds)    Therefore stopping smoking and maintaining abstinence are  the most important aspects of care, not choice of inhalers or for that matter, doctors.   Treatment other than smoking cessation  is entirely directed by severity of symptoms and focused also on reducing exacerbations, not attempting to change the natural history of the disease.   

## 2017-10-17 NOTE — Assessment & Plan Note (Signed)
Echo 08/30/17 Left ventricle: The cavity size was normal. There was severe   concentric hypertrophy. Systolic function was normal. The   estimated ejection fraction was in the range of 55% to 60%.   Hypokinesis of the anteroseptal and inferoseptal myocardium. - Ventricular septum: The contour showed diastolic flattening and   systolic flattening consistent with right ventricular pressure   and volume overload. - Aortic valve: Transvalvular velocity was within the normal range.   There was no stenosis. There was no regurgitation. - Mitral valve: Calcified annulus. Moderately thickened leaflets .   Mobility was restricted. The findings are consistent with severe   stenosis. There was mild regurgitation. Mean gradient (D): 16 mm   Hg. Valve area by pressure half-time: 2.01 cm^2. - Left atrium: The atrium was severely dilated. - Right ventricle: The cavity size was severely dilated. Wall   thickness was normal. Systolic function was normal. - Right atrium: The atrium was severely dilated. - Atrial septum: No defect or patent foramen ovale was identified   by color flow Doppler. - Tricuspid valve: There was moderate regurgitation. - Pulmonary arteries: PA peak pressure: 91 mm Hg (S). - Pericardium, extracardiac: A moderate pericardial effusion was   identified along the left ventricular free wall measuring up to   1.64 cm. Unchanged compared with the echo 05/2017. There was no   chamber collapse. There was evidence for increased RV-LV   interaction demonstrated by respirophasic changes in transmitral   velocities and tricuspid velocities. The possibility of   hemodynamic compromise cannot be excluded on the basis of   available images. - Impressions: Severe mitral stenosis by mean gradient. However,   calculated valve area indicates mild stenosis. The leaflets are   restricted but there does not appear to be severe stenosis.  He could have element of PAH from liver dz and may benefit from  [place] so need arrange cards eval next now that we know spirometry is now showing copd

## 2017-10-17 NOTE — Assessment & Plan Note (Signed)
Dates back to at least 01/29/10 Sniff test ordered  Has h/o ascites which would def impact his Cabd and make poor diaphagm function on L more problematic but says not can sleep flat s sob

## 2017-10-18 ENCOUNTER — Ambulatory Visit (HOSPITAL_COMMUNITY): Admission: RE | Admit: 2017-10-18 | Payer: Self-pay | Source: Ambulatory Visit

## 2017-10-18 DIAGNOSIS — G8929 Other chronic pain: Secondary | ICD-10-CM | POA: Diagnosis not present

## 2017-10-18 DIAGNOSIS — M79602 Pain in left arm: Secondary | ICD-10-CM | POA: Diagnosis not present

## 2017-10-19 ENCOUNTER — Other Ambulatory Visit (HOSPITAL_COMMUNITY): Payer: Self-pay

## 2017-10-19 DIAGNOSIS — N186 End stage renal disease: Secondary | ICD-10-CM | POA: Diagnosis not present

## 2017-10-19 DIAGNOSIS — D631 Anemia in chronic kidney disease: Secondary | ICD-10-CM | POA: Diagnosis not present

## 2017-10-19 DIAGNOSIS — N2581 Secondary hyperparathyroidism of renal origin: Secondary | ICD-10-CM | POA: Diagnosis not present

## 2017-10-19 DIAGNOSIS — D509 Iron deficiency anemia, unspecified: Secondary | ICD-10-CM | POA: Diagnosis not present

## 2017-10-19 DIAGNOSIS — A499 Bacterial infection, unspecified: Secondary | ICD-10-CM | POA: Diagnosis not present

## 2017-10-20 ENCOUNTER — Ambulatory Visit: Payer: Self-pay | Admitting: Physician Assistant

## 2017-10-21 DIAGNOSIS — A499 Bacterial infection, unspecified: Secondary | ICD-10-CM | POA: Diagnosis not present

## 2017-10-21 DIAGNOSIS — N2581 Secondary hyperparathyroidism of renal origin: Secondary | ICD-10-CM | POA: Diagnosis not present

## 2017-10-21 DIAGNOSIS — D509 Iron deficiency anemia, unspecified: Secondary | ICD-10-CM | POA: Diagnosis not present

## 2017-10-21 DIAGNOSIS — D631 Anemia in chronic kidney disease: Secondary | ICD-10-CM | POA: Diagnosis not present

## 2017-10-21 DIAGNOSIS — N186 End stage renal disease: Secondary | ICD-10-CM | POA: Diagnosis not present

## 2017-10-24 DIAGNOSIS — N186 End stage renal disease: Secondary | ICD-10-CM | POA: Diagnosis not present

## 2017-10-24 DIAGNOSIS — N2581 Secondary hyperparathyroidism of renal origin: Secondary | ICD-10-CM | POA: Diagnosis not present

## 2017-10-24 DIAGNOSIS — D631 Anemia in chronic kidney disease: Secondary | ICD-10-CM | POA: Diagnosis not present

## 2017-10-24 DIAGNOSIS — D509 Iron deficiency anemia, unspecified: Secondary | ICD-10-CM | POA: Diagnosis not present

## 2017-10-24 DIAGNOSIS — A499 Bacterial infection, unspecified: Secondary | ICD-10-CM | POA: Diagnosis not present

## 2017-10-25 ENCOUNTER — Encounter (HOSPITAL_COMMUNITY): Payer: Self-pay

## 2017-10-25 ENCOUNTER — Emergency Department (HOSPITAL_COMMUNITY)
Admission: EM | Admit: 2017-10-25 | Discharge: 2017-10-25 | Disposition: A | Discharge status: Home / Self Care | Payer: Medicare Other | Source: Home / Self Care | Attending: Emergency Medicine | Admitting: Emergency Medicine

## 2017-10-25 ENCOUNTER — Emergency Department (HOSPITAL_COMMUNITY): Payer: Medicare Other

## 2017-10-25 DIAGNOSIS — F17210 Nicotine dependence, cigarettes, uncomplicated: Secondary | ICD-10-CM

## 2017-10-25 DIAGNOSIS — R0600 Dyspnea, unspecified: Secondary | ICD-10-CM | POA: Diagnosis not present

## 2017-10-25 DIAGNOSIS — R188 Other ascites: Secondary | ICD-10-CM | POA: Diagnosis not present

## 2017-10-25 DIAGNOSIS — I2510 Atherosclerotic heart disease of native coronary artery without angina pectoris: Secondary | ICD-10-CM | POA: Insufficient documentation

## 2017-10-25 DIAGNOSIS — N186 End stage renal disease: Secondary | ICD-10-CM

## 2017-10-25 DIAGNOSIS — I120 Hypertensive chronic kidney disease with stage 5 chronic kidney disease or end stage renal disease: Secondary | ICD-10-CM | POA: Insufficient documentation

## 2017-10-25 DIAGNOSIS — Z992 Dependence on renal dialysis: Secondary | ICD-10-CM

## 2017-10-25 DIAGNOSIS — Z79899 Other long term (current) drug therapy: Secondary | ICD-10-CM | POA: Insufficient documentation

## 2017-10-25 DIAGNOSIS — J449 Chronic obstructive pulmonary disease, unspecified: Secondary | ICD-10-CM

## 2017-10-25 DIAGNOSIS — I252 Old myocardial infarction: Secondary | ICD-10-CM | POA: Insufficient documentation

## 2017-10-25 DIAGNOSIS — I447 Left bundle-branch block, unspecified: Secondary | ICD-10-CM | POA: Diagnosis not present

## 2017-10-25 DIAGNOSIS — R1084 Generalized abdominal pain: Secondary | ICD-10-CM

## 2017-10-25 DIAGNOSIS — R109 Unspecified abdominal pain: Secondary | ICD-10-CM

## 2017-10-25 DIAGNOSIS — K7031 Alcoholic cirrhosis of liver with ascites: Secondary | ICD-10-CM | POA: Diagnosis not present

## 2017-10-25 LAB — LIPASE, BLOOD: Lipase: 20 U/L (ref 11–51)

## 2017-10-25 LAB — CBC
HCT: 28.8 % — ABNORMAL LOW (ref 39.0–52.0)
Hemoglobin: 9.8 g/dL — ABNORMAL LOW (ref 13.0–17.0)
MCH: 26 pg (ref 26.0–34.0)
MCHC: 34 g/dL (ref 30.0–36.0)
MCV: 76.4 fL — ABNORMAL LOW (ref 78.0–100.0)
Platelets: 218 10*3/uL (ref 150–400)
RBC: 3.77 MIL/uL — ABNORMAL LOW (ref 4.22–5.81)
RDW: 20.1 % — ABNORMAL HIGH (ref 11.5–15.5)
WBC: 8.8 10*3/uL (ref 4.0–10.5)

## 2017-10-25 LAB — COMPREHENSIVE METABOLIC PANEL
ALT: 16 U/L — ABNORMAL LOW (ref 17–63)
AST: 19 U/L (ref 15–41)
Albumin: 1.7 g/dL — ABNORMAL LOW (ref 3.5–5.0)
Alkaline Phosphatase: 98 U/L (ref 38–126)
Anion gap: 7 (ref 5–15)
BUN: 17 mg/dL (ref 6–20)
CO2: 16 mmol/L — ABNORMAL LOW (ref 22–32)
Calcium: 5 mg/dL — CL (ref 8.9–10.3)
Chloride: 116 mmol/L — ABNORMAL HIGH (ref 101–111)
Creatinine, Ser: 2.65 mg/dL — ABNORMAL HIGH (ref 0.61–1.24)
GFR calc Af Amer: 30 mL/min — ABNORMAL LOW (ref >60)
GFR calc non Af Amer: 26 mL/min — ABNORMAL LOW (ref >60)
Glucose, Bld: 44 mg/dL — CL (ref 65–99)
Potassium: 3 mmol/L — ABNORMAL LOW (ref 3.5–5.1)
Sodium: 139 mmol/L (ref 135–145)
Total Bilirubin: 0.4 mg/dL (ref 0.3–1.2)
Total Protein: 3.5 g/dL — ABNORMAL LOW (ref 6.5–8.1)

## 2017-10-25 LAB — CBG MONITORING, ED: Glucose-Capillary: 155 mg/dL — ABNORMAL HIGH (ref 65–99)

## 2017-10-25 MED ORDER — IOPAMIDOL (ISOVUE-300) INJECTION 61%
INTRAVENOUS | Status: AC
  Filled 2017-10-25: qty 100

## 2017-10-25 MED ORDER — MORPHINE SULFATE (PF) 4 MG/ML IV SOLN
4.0000 mg | Freq: Once | INTRAVENOUS | Status: AC
  Administered 2017-10-25: 4 mg via INTRAVENOUS
  Filled 2017-10-25: qty 1

## 2017-10-25 MED ORDER — IOPAMIDOL (ISOVUE-300) INJECTION 61%
100.0000 mL | Freq: Once | INTRAVENOUS | Status: AC | PRN
  Administered 2017-10-25: 80 mL via INTRAVENOUS

## 2017-10-25 MED ORDER — DEXTROSE 50 % IV SOLN
INTRAVENOUS | Status: AC
  Filled 2017-10-25: qty 50

## 2017-10-25 MED ORDER — DEXTROSE 50 % IV SOLN
1.0000 | Freq: Once | INTRAVENOUS | Status: AC
  Administered 2017-10-25: 50 mL via INTRAVENOUS

## 2017-10-25 NOTE — ED Triage Notes (Signed)
Pt called EMS for abdominal pain, pt states he's having sharp abdominal pain for three days EMS reports a bottle of oxycodone that was filled on 4-9 and the bottle was empty

## 2017-10-25 NOTE — ED Provider Notes (Signed)
[facility] DEPT Provider Note   CSN: [id number] Arrival date & time: 10/25/17  0207     History   Chief Complaint Chief Complaint  Patient presents with  . Abdominal Pain    HPI [name] is a 56 y.o. male.  HPI Patient is a 57 year old male with a history of cirrhosis and ascites who presents the emergency department with generalized mild abdominal pain without associated fever or nausea or vomiting or diarrhea.  He states he is generally sore throughout his abdomen.  The majority of his pain is sharp and intermittent has been present over the past 3 days.  He has been taking oxycodone with only mild relief.  He underwent paracentesis last week and had 3 L of fluid taken off.  He states he is back to feeling slightly distended although not as swollen and tight as he normally is when he requires fluid to be withdrawn.  No history of bowel obstruction.  He has end-stage renal disease.  He is scheduled for dialysis today.  His last dialysis session was on Saturday.  He denies shortness of breath.  No chest pain.  Symptoms are mild to moderate in severity.  Nothing worsens or improves his symptoms.  Denies back pain.  No chest pain.   Past Medical History:  Diagnosis Date  . Adenomatous colon polyp    tubular  . Anemia   . Anxiety   . Arthritis    left shoulder  . Atherosclerosis of aorta ([place])   . Cardiomegaly   . Chest pain    DATE UNKNOWN, C/O PERIODICALLY  . Cocaine abuse ([place])   . COPD exacerbation ([place]) 08/17/2016  . Coronary artery disease    stent 02/22/17  . Dialysis patient ([place])    Monday-Wednesday-Friday  . ESRD (end stage renal disease) on dialysis ([place])    "[name]; MWF" (07/04/2017)  . GERD (gastroesophageal reflux disease)    DATE UNKNOWN  . Hemorrhoids   . Hepatitis B, chronic ([place])   . Hepatitis C   . History of kidney stones   . Hyperkalemia   . Hypertension   . Metabolic bone disease    Patient denies    . Myocardial infarction ([place])   . Pneumonia   . Pulmonary edema   . Renal disorder   . Renal insufficiency   . Shortness of breath dyspnea    " for the last past year with this dialysis"  . Solitary rectal ulcer syndrome   . Tubular adenoma of colon     Patient Active Problem List   Diagnosis Date Noted  . Upper airway cough syndrome with flattening on f/v loop 10/13/17 c/w vcd 10/17/2017  . Elevated diaphragm 10/13/2017  . Ileus ([place]) 09/29/2017  . QT prolongation 09/29/2017  . Malnutrition of moderate degree 09/29/2017  . Sinus congestion 09/03/2017  . Symptomatic anemia 09/02/2017  . Other cirrhosis of liver ([place]) 09/02/2017  . Left bundle branch block 09/02/2017  . Mitral stenosis 09/02/2017  . Hematochezia 07/15/2017  . Wide-complex tachycardia ([place])   . Endotracheally intubated   . ESRD (end stage renal disease) ([place]) 07/04/2017  . Acute respiratory failure with hypoxia ([place]) 06/18/2017  . CKD (chronic kidney disease) stage V requiring chronic dialysis ([place]) 06/18/2017  . History of Cocaine abuse ([place]) 06/18/2017  . Hypertension 06/18/2017  . Infection of AV graft for dialysis ([place]) 06/18/2017  . Anxiety 06/18/2017  . Anemia due to chronic kidney disease 06/18/2017  . Atrial flutter with  rapid ventricular response ([place]) 06/18/2017  . Personality disorder ([place]) 06/13/2017  . Cellulitis 06/12/2017  . Adjustment disorder with mixed anxiety and depressed mood 06/10/2017  . Suicidal ideation 06/10/2017  . Arm wound, left, sequela 06/10/2017  . Dyspnea on exertion 05/29/2017  . Tachycardia 05/29/2017  . Hyperkalemia 05/22/2017  . Acute metabolic encephalopathy   . Anemia 04/23/2017  . Ascites 04/23/2017  . COPD (chronic obstructive pulmonary disease) ([place]) 04/23/2017  . Acute on chronic respiratory failure with hypoxia ([place]) 03/25/2017  . Arrhythmia 03/25/2017  . COPD GOLD 0 with flattening on inps f/v  09/27/2016  . Essential hypertension 09/27/2016  . Fluid  overload 08/30/2016  . COPD exacerbation ([place]) 08/17/2016  . Hypertensive urgency 08/17/2016  . Respiratory failure ([place]) 08/17/2016  . Problem with dialysis access ([place]) 07/23/2016  . End stage renal disease ([place]) 12/02/2015  . Chronic hepatitis B ([place]) 03/05/2014  . Chronic hepatitis C without hepatic coma ([place]) 03/05/2014  . Internal hemorrhoids with bleeding, swelling and itching 03/05/2014  . Thrombocytopenia ([place]) 03/05/2014  . Chest pain 02/27/2014  . Alcohol abuse 04/14/2009  . Cigarette smoker 04/14/2009  . GANGLION CYST 04/14/2009    Past Surgical History:  Procedure Laterality Date  . A/V FISTULAGRAM Left 05/26/2017   Procedure: A/V FISTULAGRAM;  Surgeon: Conrad Steptoe, MD;  Location: [facility];  Service: Cardiovascular;  Laterality: Left;  . APPLICATION OF WOUND VAC Left 06/14/2017   Procedure: APPLICATION OF WOUND VAC;  Surgeon: Katha Cabal, MD;  Location: [facility];  Service: Vascular;  Laterality: Left;  . AV FISTULA PLACEMENT  2012   BELIEVED WAS PLACED IN JUNE  . AV FISTULA PLACEMENT Right 08/09/2017   Procedure: Creation Right arm ARTERIOVENOUS BRACHIOCEPOHALIC FISTULA;  Surgeon: Elam Dutch, MD;  Location: [facility];  Service: Vascular;  Laterality: Right;  . COLONOSCOPY    . CORONARY STENT INTERVENTION N/A 02/22/2017   Procedure: CORONARY STENT INTERVENTION;  Surgeon: Nigel Mormon, MD;  Location: [facility];  Service: Cardiovascular;  Laterality: N/A;  . FLEXIBLE SIGMOIDOSCOPY N/A 07/15/2017   Procedure: FLEXIBLE SIGMOIDOSCOPY;  Surgeon: Carol Ada, MD;  Location: [facility];  Service: Endoscopy;  Laterality: N/A;  . HEMORRHOID BANDING    . I&D EXTREMITY Left 06/01/2017   Procedure: IRRIGATION AND DEBRIDEMENT LEFT ARM HEMATOMA WITH LIGATION OF LEFT ARM AV FISTULA;  Surgeon: Elam Dutch, MD;  Location: [facility];  Service: Vascular;  Laterality: Left;  . I&D EXTREMITY Left 06/14/2017   Procedure: IRRIGATION AND DEBRIDEMENT  EXTREMITY;  Surgeon: Katha Cabal, MD;  Location: [facility];  Service: Vascular;  Laterality: Left;  . INSERTION OF DIALYSIS CATHETER  05/30/2017  . INSERTION OF DIALYSIS CATHETER N/A 05/30/2017   Procedure: INSERTION OF DIALYSIS CATHETER;  Surgeon: Elam Dutch, MD;  Location: [facility];  Service: Vascular;  Laterality: N/A;  . IR PARACENTESIS  08/30/2017  . IR PARACENTESIS  09/29/2017  . LEFT HEART CATH AND CORONARY ANGIOGRAPHY N/A 02/22/2017   Procedure: LEFT HEART CATH AND CORONARY ANGIOGRAPHY;  Surgeon: Nigel Mormon, MD;  Location: [facility];  Service: Cardiovascular;  Laterality: N/A;  . LIGATION OF ARTERIOVENOUS  FISTULA Left 4/0/9735   Procedure: Plication of Left Arm Arteriovenous Fistula;  Surgeon: Elam Dutch, MD;  Location: [facility];  Service: Vascular;  Laterality: Left;  . POLYPECTOMY    . REVISON OF ARTERIOVENOUS FISTULA Left 10/07/9240   Procedure: PLICATION OF DISTAL ANEURYSMAL SEGEMENT OF LEFT UPPER ARM ARTERIOVENOUS FISTULA;  Surgeon: Elam Dutch, MD;  Location: [facility];  Service: Vascular;  Laterality: Left;  . REVISON OF ARTERIOVENOUS FISTULA Left 1/61/0960   Procedure: Plication of Left Upper Arm Fistula ;  Surgeon: Waynetta Sandy, MD;  Location: [facility];  Service: Vascular;  Laterality: Left;  . SKIN GRAFT SPLIT THICKNESS LEG / FOOT Left    SKIN GRAFT SPLIT THICKNESS LEFT ARM DONOR SITE: LEFT ANTERIOR THIGH  . SKIN SPLIT GRAFT Left 07/04/2017   Procedure: SKIN GRAFT SPLIT THICKNESS LEFT ARM DONOR SITE: LEFT ANTERIOR THIGH;  Surgeon: Elam Dutch, MD;  Location: [facility];  Service: Vascular;  Laterality: Left;  . THROMBECTOMY W/ EMBOLECTOMY Left 06/05/2017   Procedure: EXPLORATION OF LEFT ARM FOR BLEEDING; OVERSEWED PROXIMAL FISTULA;  Surgeon: Angelia Mould, MD;  Location: [facility];  Service: Vascular;  Laterality: Left;  . WOUND EXPLORATION Left 06/03/2017   Procedure: WOUND EXPLORATION WITH WOUND VAC APPLICATION TO LEFT ARM;   Surgeon: Angelia Mould, MD;  Location: [facility];  Service: Vascular;  Laterality: Left;        Home Medications    Prior to Admission medications   Medication Sig Start Date End Date Taking? Authorizing Provider  albuterol (VENTOLIN HFA) 108 (90 Base) MCG/ACT inhaler Inhale 2 puffs into the lungs every 6 (six) hours as needed for wheezing or shortness of breath.   Yes [provider]  budesonide-formoterol (SYMBICORT) 80-4.5 MCG/ACT inhaler Inhale 2 puffs into the lungs 2 (two) times daily. 10/13/17  Yes Tanda Rockers, MD  clopidogrel (PLAVIX) 75 MG tablet Take 1 tablet (75 mg total) by mouth daily. 07/15/17  Yes Rhyne, Hulen Shouts, PA-C  diltiazem (CARDIZEM CD) 120 MG 24 hr capsule Take 1 capsule (120 mg total) by mouth daily. 06/23/17 06/23/18 Yes Shahmehdi, Valeria Batman, MD  famotidine (PEPCID) 20 MG tablet One at bedtime Patient taking differently: Take 20 mg by mouth at bedtime.  09/08/17  Yes Tanda Rockers, MD  gabapentin (NEURONTIN) 100 MG capsule Take 1 capsule (100 mg total) by mouth at bedtime. 09/30/17  Yes Mariel Aloe, MD  hydrALAZINE (APRESOLINE) 100 MG tablet Take 1 tablet by mouth twice a day take after HD on dialysis days 08/31/17  Yes [provider]  isosorbide mononitrate (IMDUR) 30 MG 24 hr tablet Take 1 tablet (30 mg total) by mouth daily. 06/17/17  Yes [name], MD  Nutritional Supplements (FEEDING SUPPLEMENT, NEPRO CARB STEADY,) LIQD Take 237 mLs by mouth 2 (two) times daily between meals. 09/30/17  Yes Mariel Aloe, MD  oxyCODONE (OXY IR/ROXICODONE) 5 MG immediate release tablet Take 5 mg by mouth 2 (two) times daily as needed for moderate pain.  09/22/17  Yes [provider]  pantoprazole (PROTONIX) 40 MG tablet Take 1 tablet (40 mg total) by mouth daily. Take 30-60 min before first meal of the day 09/08/17  Yes Tanda Rockers, MD  SPIRIVA HANDIHALER 18 MCG inhalation capsule Place 1 puff into inhaler and inhale daily. 10/04/17  Yes  [provider]    Family History Family History  Problem Relation Age of Onset  . Heart disease Mother   . Lung cancer Mother   . Heart disease Father   . Malignant hyperthermia Father   . COPD Father   . Throat cancer Sister   . Esophageal cancer Sister   . Hypertension Other   . COPD Other   . Colon cancer Neg Hx   . Colon polyps Neg Hx   .  Rectal cancer Neg Hx   . Stomach cancer Neg Hx     Social History Social History   Tobacco Use  . Smoking status: Current Every Day Smoker    Packs/day: 0.50    Years: 43.00    Pack years: 21.50    Types: Cigarettes    Start date: 08/13/1973  . Smokeless tobacco: Never Used  Substance Use Topics  . Alcohol use: Yes    Frequency: Never    Comment: quit drinking in 2017  . Drug use: Yes    Types: Marijuana, Cocaine    Comment: quit in 2017"     Allergies   Aspirin; Clonidine derivatives; and Tramadol   Review of Systems Review of Systems  All other systems reviewed and are negative.    Physical Exam Updated Vital Signs BP 108/78   Pulse 88   Temp 98.4 F (36.9 C) (Oral)   Resp 18   SpO2 100%   Physical Exam  Constitutional: He is oriented to person, place, and time. He appears well-developed and well-nourished.  HENT:  Head: Normocephalic and atraumatic.  Eyes: EOM are normal.  Neck: Normal range of motion.  Cardiovascular: Normal rate, regular rhythm, normal heart sounds and intact distal pulses.  Pulmonary/Chest: Effort normal and breath sounds normal. No respiratory distress.  Abdominal: Soft.  Mild generalized abdominal tenderness without guarding or rebound.  No peritoneal signs.  Mild distention from ascites without tight distention at this time.  Musculoskeletal: Normal range of motion.  Neurological: He is alert and oriented to person, place, and time.  Skin: Skin is warm and dry.  Psychiatric: He has a normal mood and affect. Judgment normal.  Nursing note and vitals reviewed.    ED  Treatments / Results  Labs (all labs ordered are listed, but only abnormal results are displayed) Labs Reviewed  CBC - Abnormal; Notable for the following components:      Result Value   RBC 3.77 (*)    Hemoglobin 9.8 (*)    HCT 28.8 (*)    MCV 76.4 (*)    RDW 20.1 (*)    All other components within normal limits  COMPREHENSIVE METABOLIC PANEL - Abnormal; Notable for the following components:   Potassium 3.0 (*)    Chloride 116 (*)    CO2 16 (*)    Glucose, Bld 44 (*)    Creatinine, Ser 2.65 (*)    Calcium 5.0 (*)    Total Protein 3.5 (*)    Albumin 1.7 (*)    ALT 16 (*)    GFR calc non Af Amer 26 (*)    GFR calc Af Amer 30 (*)    All other components within normal limits  CBG MONITORING, ED - Abnormal; Notable for the following components:   Glucose-Capillary 155 (*)    All other components within normal limits  LIPASE, BLOOD    EKG None  Radiology Ct Abdomen Pelvis W Contrast  Result Date: 10/25/2017 CLINICAL DATA:  Acute abdominal pain, generalized. EXAM: CT ABDOMEN AND PELVIS WITH CONTRAST TECHNIQUE: Multidetector CT imaging of the abdomen and pelvis was performed using the standard protocol following bolus administration of intravenous contrast. CONTRAST:  46mL ISOVUE-300 IOPAMIDOL (ISOVUE-300) INJECTION 61% COMPARISON:  09/28/2017 FINDINGS: Lower chest: Cardiomegaly. Central line with tip in the right atrium. Overall small pericardial effusion, stable. Extensive atherosclerotic calcification of the coronaries and aorta. Hepatobiliary: Lobulated liver surface and large caudate lobe suggesting cirrhosis. No focal mass. Patent portal vein.No evidence of biliary obstruction or  stone. Pancreas: Stable Spleen: Unremarkable. Adrenals/Urinary Tract: Negative adrenals. Marked bilateral renal atrophy with small cystic densities. Patient has history of end-stage renal disease. Unremarkable bladder. Stomach/Bowel: No obstruction. No visible inflammation. There is a clip noted within  the rectum. Vascular/Lymphatic: No acute vascular abnormality. Severe atherosclerosis with confluent calcification along the aorta and visceral branches. No mass or adenopathy. Reproductive:No pathologic findings. Other: There is a large volume ascites without visible loculation. Chronic peritoneal thickening in the pelvis, seen since at least 04/2017 Musculoskeletal: Generalized bony sclerosis, likely renal osteodystrophy. IMPRESSION: 1. No acute finding. 2. Large volume ascites. Chronic peritoneal thickening in the pelvis, please correlate with recent paracentesis results. 3. Suspected cirrhosis. 4.  Aortic Atherosclerosis (ICD10-I70.0).  Coronary atherosclerosis. Electronically Signed   By: Monte Fantasia M.D.   On: 10/25/2017 09:49    Procedures Procedures (including critical care time)  Medications Ordered in ED Medications  morphine 4 MG/ML injection 4 mg (4 mg Intravenous Given 10/25/17 0754)  morphine 4 MG/ML injection 4 mg (4 mg Intravenous Given 10/25/17 0900)  dextrose 50 % solution 50 mL (50 mLs Intravenous Given 10/25/17 0900)  iopamidol (ISOVUE-300) 61 % injection 100 mL (80 mLs Intravenous Contrast Given 10/25/17 0931)     Initial Impression / Assessment and Plan / ED Course  I have reviewed the triage vital signs and the nursing notes.  Pertinent labs & imaging results that were available during my care of the patient were reviewed by me and considered in my medical decision making (see chart for details).     CT personally reviewed.  Patient with large volume ascites but no other obvious intra-abdominal pathology.  Patient feels better at this time.  He will follow-up at his dialysis center.  Hypocalcemia noted.  He has been informed to speak to his nephrology team regarding his hypocalcemia so that his dialysis regimen can be appropriately adjusted to compensate for this.  Ongoing follow-up for his hypocalcemia with his nephrology team.  No indication for acute hospitalization  secondary to this.  Doubt spontaneous bacterial peritonitis.  Afebrile.  No white count.  No peritoneal signs at this time.  He will likely need therapeutic paracentesis in the coming week to 2.  I have asked that he speak with his primary care physician or his GI doctor about arranging this as an outpatient.  I do not think it needs to be performed on an emergent basis at this time.  Patient understands return to the ER for new or worsening symptoms  Final Clinical Impressions(s) / ED Diagnoses   Final diagnoses:  Acute abdominal pain  Other ascites    ED Discharge Orders    None       Jola Schmidt, MD 10/25/17 2205

## 2017-10-25 NOTE — ED Notes (Addendum)
Informed Dr. Venora Maples of Critical lab values Glucose 44 and Cal 5.0.

## 2017-10-26 DIAGNOSIS — D631 Anemia in chronic kidney disease: Secondary | ICD-10-CM | POA: Diagnosis not present

## 2017-10-26 DIAGNOSIS — N2581 Secondary hyperparathyroidism of renal origin: Secondary | ICD-10-CM | POA: Diagnosis not present

## 2017-10-26 DIAGNOSIS — N186 End stage renal disease: Secondary | ICD-10-CM | POA: Diagnosis not present

## 2017-10-26 DIAGNOSIS — D509 Iron deficiency anemia, unspecified: Secondary | ICD-10-CM | POA: Diagnosis not present

## 2017-10-26 DIAGNOSIS — A499 Bacterial infection, unspecified: Secondary | ICD-10-CM | POA: Diagnosis not present

## 2017-10-27 ENCOUNTER — Emergency Department (HOSPITAL_COMMUNITY): Payer: Medicare Other

## 2017-10-27 ENCOUNTER — Encounter (HOSPITAL_COMMUNITY): Payer: Self-pay | Admitting: Emergency Medicine

## 2017-10-27 ENCOUNTER — Inpatient Hospital Stay (HOSPITAL_COMMUNITY)
Admission: EM | Admit: 2017-10-27 | Discharge: 2017-10-30 | DRG: 432 | Disposition: A | Discharge status: Home / Self Care | Payer: Medicare Other | Attending: Internal Medicine | Admitting: Internal Medicine

## 2017-10-27 DIAGNOSIS — G4700 Insomnia, unspecified: Secondary | ICD-10-CM | POA: Diagnosis present

## 2017-10-27 DIAGNOSIS — D631 Anemia in chronic kidney disease: Secondary | ICD-10-CM | POA: Diagnosis present

## 2017-10-27 DIAGNOSIS — N189 Chronic kidney disease, unspecified: Secondary | ICD-10-CM | POA: Diagnosis present

## 2017-10-27 DIAGNOSIS — I447 Left bundle-branch block, unspecified: Secondary | ICD-10-CM | POA: Diagnosis not present

## 2017-10-27 DIAGNOSIS — Z7902 Long term (current) use of antithrombotics/antiplatelets: Secondary | ICD-10-CM

## 2017-10-27 DIAGNOSIS — Z8249 Family history of ischemic heart disease and other diseases of the circulatory system: Secondary | ICD-10-CM

## 2017-10-27 DIAGNOSIS — Z800 Family history of malignant neoplasm of digestive organs: Secondary | ICD-10-CM

## 2017-10-27 DIAGNOSIS — Z825 Family history of asthma and other chronic lower respiratory diseases: Secondary | ICD-10-CM

## 2017-10-27 DIAGNOSIS — E8770 Fluid overload, unspecified: Secondary | ICD-10-CM | POA: Diagnosis present

## 2017-10-27 DIAGNOSIS — Z9119 Patient's noncompliance with other medical treatment and regimen: Secondary | ICD-10-CM

## 2017-10-27 DIAGNOSIS — R0600 Dyspnea, unspecified: Secondary | ICD-10-CM | POA: Diagnosis not present

## 2017-10-27 DIAGNOSIS — K7031 Alcoholic cirrhosis of liver with ascites: Principal | ICD-10-CM | POA: Diagnosis present

## 2017-10-27 DIAGNOSIS — R0602 Shortness of breath: Secondary | ICD-10-CM

## 2017-10-27 DIAGNOSIS — J441 Chronic obstructive pulmonary disease with (acute) exacerbation: Secondary | ICD-10-CM | POA: Diagnosis present

## 2017-10-27 DIAGNOSIS — I10 Essential (primary) hypertension: Secondary | ICD-10-CM | POA: Diagnosis present

## 2017-10-27 DIAGNOSIS — I120 Hypertensive chronic kidney disease with stage 5 chronic kidney disease or end stage renal disease: Secondary | ICD-10-CM | POA: Diagnosis present

## 2017-10-27 DIAGNOSIS — Z888 Allergy status to other drugs, medicaments and biological substances status: Secondary | ICD-10-CM

## 2017-10-27 DIAGNOSIS — K219 Gastro-esophageal reflux disease without esophagitis: Secondary | ICD-10-CM | POA: Diagnosis present

## 2017-10-27 DIAGNOSIS — Z7951 Long term (current) use of inhaled steroids: Secondary | ICD-10-CM

## 2017-10-27 DIAGNOSIS — R188 Other ascites: Secondary | ICD-10-CM

## 2017-10-27 DIAGNOSIS — R1084 Generalized abdominal pain: Secondary | ICD-10-CM

## 2017-10-27 DIAGNOSIS — Z886 Allergy status to analgesic agent status: Secondary | ICD-10-CM

## 2017-10-27 DIAGNOSIS — B181 Chronic viral hepatitis B without delta-agent: Secondary | ICD-10-CM | POA: Diagnosis present

## 2017-10-27 DIAGNOSIS — K769 Liver disease, unspecified: Secondary | ICD-10-CM | POA: Diagnosis present

## 2017-10-27 DIAGNOSIS — I252 Old myocardial infarction: Secondary | ICD-10-CM

## 2017-10-27 DIAGNOSIS — Z79899 Other long term (current) drug therapy: Secondary | ICD-10-CM

## 2017-10-27 DIAGNOSIS — Z885 Allergy status to narcotic agent status: Secondary | ICD-10-CM

## 2017-10-27 DIAGNOSIS — F17210 Nicotine dependence, cigarettes, uncomplicated: Secondary | ICD-10-CM | POA: Diagnosis present

## 2017-10-27 DIAGNOSIS — I4892 Unspecified atrial flutter: Secondary | ICD-10-CM | POA: Diagnosis present

## 2017-10-27 DIAGNOSIS — Z955 Presence of coronary angioplasty implant and graft: Secondary | ICD-10-CM

## 2017-10-27 DIAGNOSIS — Z87442 Personal history of urinary calculi: Secondary | ICD-10-CM

## 2017-10-27 DIAGNOSIS — M19012 Primary osteoarthritis, left shoulder: Secondary | ICD-10-CM | POA: Diagnosis present

## 2017-10-27 DIAGNOSIS — N2581 Secondary hyperparathyroidism of renal origin: Secondary | ICD-10-CM | POA: Diagnosis present

## 2017-10-27 DIAGNOSIS — F1910 Other psychoactive substance abuse, uncomplicated: Secondary | ICD-10-CM | POA: Diagnosis present

## 2017-10-27 DIAGNOSIS — Z808 Family history of malignant neoplasm of other organs or systems: Secondary | ICD-10-CM

## 2017-10-27 DIAGNOSIS — N186 End stage renal disease: Secondary | ICD-10-CM

## 2017-10-27 DIAGNOSIS — K652 Spontaneous bacterial peritonitis: Secondary | ICD-10-CM | POA: Diagnosis present

## 2017-10-27 DIAGNOSIS — E8889 Other specified metabolic disorders: Secondary | ICD-10-CM | POA: Diagnosis present

## 2017-10-27 DIAGNOSIS — B1920 Unspecified viral hepatitis C without hepatic coma: Secondary | ICD-10-CM | POA: Diagnosis present

## 2017-10-27 DIAGNOSIS — J449 Chronic obstructive pulmonary disease, unspecified: Secondary | ICD-10-CM | POA: Diagnosis present

## 2017-10-27 DIAGNOSIS — R64 Cachexia: Secondary | ICD-10-CM | POA: Diagnosis present

## 2017-10-27 DIAGNOSIS — Z79891 Long term (current) use of opiate analgesic: Secondary | ICD-10-CM

## 2017-10-27 DIAGNOSIS — I050 Rheumatic mitral stenosis: Secondary | ICD-10-CM | POA: Diagnosis present

## 2017-10-27 DIAGNOSIS — Z9861 Coronary angioplasty status: Secondary | ICD-10-CM

## 2017-10-27 DIAGNOSIS — Z86010 Personal history of colonic polyps: Secondary | ICD-10-CM

## 2017-10-27 DIAGNOSIS — Z6822 Body mass index (BMI) 22.0-22.9, adult: Secondary | ICD-10-CM

## 2017-10-27 DIAGNOSIS — I700 Atherosclerosis of aorta: Secondary | ICD-10-CM | POA: Diagnosis present

## 2017-10-27 DIAGNOSIS — I2510 Atherosclerotic heart disease of native coronary artery without angina pectoris: Secondary | ICD-10-CM | POA: Diagnosis present

## 2017-10-27 DIAGNOSIS — Z992 Dependence on renal dialysis: Secondary | ICD-10-CM

## 2017-10-27 DIAGNOSIS — Z801 Family history of malignant neoplasm of trachea, bronchus and lung: Secondary | ICD-10-CM

## 2017-10-27 DIAGNOSIS — K5900 Constipation, unspecified: Secondary | ICD-10-CM | POA: Diagnosis present

## 2017-10-27 DIAGNOSIS — F1020 Alcohol dependence, uncomplicated: Secondary | ICD-10-CM | POA: Diagnosis present

## 2017-10-27 DIAGNOSIS — R109 Unspecified abdominal pain: Secondary | ICD-10-CM | POA: Diagnosis present

## 2017-10-27 DIAGNOSIS — F4323 Adjustment disorder with mixed anxiety and depressed mood: Secondary | ICD-10-CM | POA: Diagnosis present

## 2017-10-27 HISTORY — DX: Rheumatic mitral stenosis: I05.0

## 2017-10-27 LAB — COMPREHENSIVE METABOLIC PANEL
ALT: 19 U/L (ref 17–63)
AST: 25 U/L (ref 15–41)
Albumin: 3.1 g/dL — ABNORMAL LOW (ref 3.5–5.0)
Alkaline Phosphatase: 170 U/L — ABNORMAL HIGH (ref 38–126)
Anion gap: 13 (ref 5–15)
BUN: 35 mg/dL — ABNORMAL HIGH (ref 6–20)
CO2: 28 mmol/L (ref 22–32)
Calcium: 9 mg/dL (ref 8.9–10.3)
Chloride: 91 mmol/L — ABNORMAL LOW (ref 101–111)
Creatinine, Ser: 6.78 mg/dL — ABNORMAL HIGH (ref 0.61–1.24)
GFR calc Af Amer: 9 mL/min — ABNORMAL LOW (ref >60)
GFR calc non Af Amer: 8 mL/min — ABNORMAL LOW (ref >60)
Glucose, Bld: 129 mg/dL — ABNORMAL HIGH (ref 65–99)
Potassium: 5 mmol/L (ref 3.5–5.1)
Sodium: 132 mmol/L — ABNORMAL LOW (ref 135–145)
Total Bilirubin: 0.8 mg/dL (ref 0.3–1.2)
Total Protein: 6.6 g/dL (ref 6.5–8.1)

## 2017-10-27 LAB — LIPASE, BLOOD: Lipase: 28 U/L (ref 11–51)

## 2017-10-27 LAB — CBC
HCT: 28.6 % — ABNORMAL LOW (ref 39.0–52.0)
Hemoglobin: 9.6 g/dL — ABNORMAL LOW (ref 13.0–17.0)
MCH: 25.5 pg — ABNORMAL LOW (ref 26.0–34.0)
MCHC: 33.6 g/dL (ref 30.0–36.0)
MCV: 76.1 fL — ABNORMAL LOW (ref 78.0–100.0)
Platelets: 230 10*3/uL (ref 150–400)
RBC: 3.76 MIL/uL — ABNORMAL LOW (ref 4.22–5.81)
RDW: 20.3 % — ABNORMAL HIGH (ref 11.5–15.5)
WBC: 9.7 10*3/uL (ref 4.0–10.5)

## 2017-10-28 ENCOUNTER — Inpatient Hospital Stay (HOSPITAL_COMMUNITY): Payer: Medicare Other

## 2017-10-28 ENCOUNTER — Encounter (HOSPITAL_COMMUNITY): Payer: Self-pay | Admitting: Internal Medicine

## 2017-10-28 DIAGNOSIS — J42 Unspecified chronic bronchitis: Secondary | ICD-10-CM | POA: Diagnosis not present

## 2017-10-28 DIAGNOSIS — N2581 Secondary hyperparathyroidism of renal origin: Secondary | ICD-10-CM | POA: Diagnosis not present

## 2017-10-28 DIAGNOSIS — K652 Spontaneous bacterial peritonitis: Secondary | ICD-10-CM | POA: Diagnosis not present

## 2017-10-28 DIAGNOSIS — N186 End stage renal disease: Secondary | ICD-10-CM | POA: Diagnosis not present

## 2017-10-28 DIAGNOSIS — R1084 Generalized abdominal pain: Secondary | ICD-10-CM | POA: Diagnosis not present

## 2017-10-28 DIAGNOSIS — F4323 Adjustment disorder with mixed anxiety and depressed mood: Secondary | ICD-10-CM | POA: Diagnosis not present

## 2017-10-28 DIAGNOSIS — Z885 Allergy status to narcotic agent status: Secondary | ICD-10-CM | POA: Diagnosis not present

## 2017-10-28 DIAGNOSIS — K7031 Alcoholic cirrhosis of liver with ascites: Secondary | ICD-10-CM | POA: Diagnosis not present

## 2017-10-28 DIAGNOSIS — Z992 Dependence on renal dialysis: Secondary | ICD-10-CM | POA: Diagnosis not present

## 2017-10-28 DIAGNOSIS — Z86010 Personal history of colonic polyps: Secondary | ICD-10-CM | POA: Diagnosis not present

## 2017-10-28 DIAGNOSIS — D631 Anemia in chronic kidney disease: Secondary | ICD-10-CM

## 2017-10-28 DIAGNOSIS — R188 Other ascites: Secondary | ICD-10-CM | POA: Diagnosis not present

## 2017-10-28 DIAGNOSIS — I10 Essential (primary) hypertension: Secondary | ICD-10-CM | POA: Diagnosis not present

## 2017-10-28 DIAGNOSIS — I4892 Unspecified atrial flutter: Secondary | ICD-10-CM | POA: Diagnosis not present

## 2017-10-28 DIAGNOSIS — K7460 Unspecified cirrhosis of liver: Secondary | ICD-10-CM | POA: Diagnosis not present

## 2017-10-28 DIAGNOSIS — F1020 Alcohol dependence, uncomplicated: Secondary | ICD-10-CM | POA: Diagnosis present

## 2017-10-28 DIAGNOSIS — J441 Chronic obstructive pulmonary disease with (acute) exacerbation: Secondary | ICD-10-CM | POA: Diagnosis not present

## 2017-10-28 DIAGNOSIS — R079 Chest pain, unspecified: Secondary | ICD-10-CM | POA: Diagnosis not present

## 2017-10-28 DIAGNOSIS — Z888 Allergy status to other drugs, medicaments and biological substances status: Secondary | ICD-10-CM | POA: Diagnosis not present

## 2017-10-28 DIAGNOSIS — Z9119 Patient's noncompliance with other medical treatment and regimen: Secondary | ICD-10-CM | POA: Diagnosis not present

## 2017-10-28 DIAGNOSIS — Z886 Allergy status to analgesic agent status: Secondary | ICD-10-CM | POA: Diagnosis not present

## 2017-10-28 DIAGNOSIS — I120 Hypertensive chronic kidney disease with stage 5 chronic kidney disease or end stage renal disease: Secondary | ICD-10-CM | POA: Diagnosis not present

## 2017-10-28 DIAGNOSIS — B181 Chronic viral hepatitis B without delta-agent: Secondary | ICD-10-CM | POA: Diagnosis not present

## 2017-10-28 DIAGNOSIS — I050 Rheumatic mitral stenosis: Secondary | ICD-10-CM | POA: Diagnosis present

## 2017-10-28 DIAGNOSIS — F1910 Other psychoactive substance abuse, uncomplicated: Secondary | ICD-10-CM | POA: Diagnosis present

## 2017-10-28 DIAGNOSIS — E8770 Fluid overload, unspecified: Secondary | ICD-10-CM | POA: Diagnosis present

## 2017-10-28 DIAGNOSIS — F17210 Nicotine dependence, cigarettes, uncomplicated: Secondary | ICD-10-CM | POA: Diagnosis not present

## 2017-10-28 DIAGNOSIS — R64 Cachexia: Secondary | ICD-10-CM | POA: Diagnosis not present

## 2017-10-28 DIAGNOSIS — R0602 Shortness of breath: Secondary | ICD-10-CM | POA: Diagnosis not present

## 2017-10-28 DIAGNOSIS — K5900 Constipation, unspecified: Secondary | ICD-10-CM | POA: Diagnosis present

## 2017-10-28 DIAGNOSIS — B1920 Unspecified viral hepatitis C without hepatic coma: Secondary | ICD-10-CM | POA: Diagnosis present

## 2017-10-28 DIAGNOSIS — R109 Unspecified abdominal pain: Secondary | ICD-10-CM | POA: Diagnosis present

## 2017-10-28 HISTORY — PX: IR PARACENTESIS: IMG2679

## 2017-10-28 LAB — BODY FLUID CELL COUNT WITH DIFFERENTIAL
Eos, Fluid: 0 %
Lymphs, Fluid: 4 %
Monocyte-Macrophage-Serous Fluid: 10 % — ABNORMAL LOW (ref 50–90)
Neutrophil Count, Fluid: 86 % — ABNORMAL HIGH (ref 0–25)
Total Nucleated Cell Count, Fluid: 4900 cu mm — ABNORMAL HIGH (ref 0–1000)

## 2017-10-28 LAB — PROTEIN, PLEURAL OR PERITONEAL FLUID: Total protein, fluid: 4 g/dL

## 2017-10-28 LAB — PROTIME-INR
INR: 1.25
Prothrombin Time: 15.6 seconds — ABNORMAL HIGH (ref 11.4–15.2)

## 2017-10-28 LAB — ALBUMIN, PLEURAL OR PERITONEAL FLUID: Albumin, Fluid: 2 g/dL

## 2017-10-28 LAB — LACTATE DEHYDROGENASE, PLEURAL OR PERITONEAL FLUID: LD, Fluid: 225 U/L — ABNORMAL HIGH (ref 3–23)

## 2017-10-28 MED ORDER — TIOTROPIUM BROMIDE MONOHYDRATE 18 MCG IN CAPS
18.0000 ug | ORAL_CAPSULE | Freq: Every day | RESPIRATORY_TRACT | Status: DC
  Filled 2017-10-28: qty 5

## 2017-10-28 MED ORDER — HYDRALAZINE HCL 25 MG PO TABS
100.0000 mg | ORAL_TABLET | Freq: Three times a day (TID) | ORAL | Status: DC
  Administered 2017-10-29 – 2017-10-30 (×3): 100 mg via ORAL
  Filled 2017-10-28 (×3): qty 4

## 2017-10-28 MED ORDER — METOPROLOL TARTRATE 5 MG/5ML IV SOLN
5.0000 mg | Freq: Once | INTRAVENOUS | Status: AC
  Administered 2017-10-28: 5 mg via INTRAVENOUS
  Filled 2017-10-28: qty 5

## 2017-10-28 MED ORDER — HYDROMORPHONE HCL 2 MG/ML IJ SOLN
0.5000 mg | INTRAMUSCULAR | Status: DC | PRN
  Administered 2017-10-28: 0.5 mg via INTRAVENOUS
  Filled 2017-10-28: qty 1

## 2017-10-28 MED ORDER — LIDOCAINE HCL (PF) 2 % IJ SOLN
INTRAMUSCULAR | Status: AC | PRN
  Administered 2017-10-28: 10 mL

## 2017-10-28 MED ORDER — HYDROMORPHONE HCL 2 MG/ML IJ SOLN
1.0000 mg | Freq: Once | INTRAMUSCULAR | Status: AC
Start: 2017-10-28 — End: 2017-10-28
  Administered 2017-10-28: 1 mg via INTRAVENOUS
  Filled 2017-10-28: qty 1

## 2017-10-28 MED ORDER — HYDROMORPHONE HCL 1 MG/ML IJ SOLN
INTRAMUSCULAR | Status: AC
  Administered 2017-10-28: 0.5 mg via INTRAVENOUS
  Filled 2017-10-28: qty 0.5

## 2017-10-28 MED ORDER — HYDROMORPHONE HCL 2 MG/ML IJ SOLN
1.0000 mg | Freq: Once | INTRAMUSCULAR | Status: AC
  Administered 2017-10-28: 1 mg via INTRAVENOUS
  Filled 2017-10-28: qty 1

## 2017-10-28 MED ORDER — SEVELAMER CARBONATE 800 MG PO TABS
800.0000 mg | ORAL_TABLET | Freq: Three times a day (TID) | ORAL | Status: DC
  Filled 2017-10-28 (×2): qty 1

## 2017-10-28 MED ORDER — ALBUTEROL SULFATE (2.5 MG/3ML) 0.083% IN NEBU
2.5000 mg | INHALATION_SOLUTION | Freq: Four times a day (QID) | RESPIRATORY_TRACT | Status: DC | PRN
  Administered 2017-10-28: 2.5 mg via RESPIRATORY_TRACT
  Filled 2017-10-28: qty 3

## 2017-10-28 MED ORDER — ONDANSETRON HCL 4 MG PO TABS: 4.0000 mg | ORAL_TABLET | Freq: Four times a day (QID) | ORAL | Status: DC | PRN

## 2017-10-28 MED ORDER — ISOSORBIDE MONONITRATE ER 30 MG PO TB24
30.0000 mg | ORAL_TABLET | Freq: Every day | ORAL | Status: DC
  Administered 2017-10-29 – 2017-10-30 (×2): 30 mg via ORAL
  Filled 2017-10-28 (×2): qty 1

## 2017-10-28 MED ORDER — HYDROMORPHONE HCL 1 MG/ML IJ SOLN
0.5000 mg | INTRAMUSCULAR | Status: DC | PRN
  Administered 2017-10-28 – 2017-10-30 (×6): 0.5 mg via INTRAVENOUS
  Filled 2017-10-28 (×5): qty 1

## 2017-10-28 MED ORDER — ALBUTEROL SULFATE HFA 108 (90 BASE) MCG/ACT IN AERS: 2.0000 | INHALATION_SPRAY | Freq: Four times a day (QID) | RESPIRATORY_TRACT | Status: DC | PRN

## 2017-10-28 MED ORDER — PENTAFLUOROPROP-TETRAFLUOROETH EX AERO: 1.0000 "application " | INHALATION_SPRAY | CUTANEOUS | Status: DC | PRN

## 2017-10-28 MED ORDER — SODIUM CHLORIDE 0.9 % IV SOLN
2.0000 g | INTRAVENOUS | Status: DC
  Administered 2017-10-28 – 2017-10-30 (×3): 2 g via INTRAVENOUS
  Filled 2017-10-28 (×3): qty 20

## 2017-10-28 MED ORDER — LIDOCAINE HCL (PF) 2 % IJ SOLN
INTRAMUSCULAR | Status: AC
  Filled 2017-10-28: qty 20

## 2017-10-28 MED ORDER — NEPRO/CARBSTEADY PO LIQD
237.0000 mL | Freq: Two times a day (BID) | ORAL | Status: DC
  Administered 2017-10-29 – 2017-10-30 (×3): 237 mL via ORAL
  Filled 2017-10-28 (×7): qty 237

## 2017-10-28 MED ORDER — LACTULOSE 10 GM/15ML PO SOLN
20.0000 g | Freq: Once | ORAL | Status: DC
  Filled 2017-10-28: qty 30

## 2017-10-28 MED ORDER — RENA-VITE PO TABS
1.0000 | ORAL_TABLET | Freq: Every day | ORAL | Status: DC
  Administered 2017-10-29: 1 via ORAL
  Filled 2017-10-28 (×2): qty 1

## 2017-10-28 MED ORDER — SODIUM CHLORIDE 0.9% FLUSH
3.0000 mL | Freq: Two times a day (BID) | INTRAVENOUS | Status: DC
  Administered 2017-10-28 – 2017-10-29 (×3): 3 mL via INTRAVENOUS

## 2017-10-28 MED ORDER — LIDOCAINE HCL 2 % IJ SOLN: 20.0000 mL | Freq: Once | INTRAMUSCULAR | Status: DC

## 2017-10-28 MED ORDER — SODIUM CHLORIDE 0.9 % IV SOLN: 250.0000 mL | INTRAVENOUS | Status: DC | PRN

## 2017-10-28 MED ORDER — ALTEPLASE 2 MG IJ SOLR: 2.0000 mg | Freq: Once | INTRAMUSCULAR | Status: DC | PRN

## 2017-10-28 MED ORDER — HYDROMORPHONE HCL 1 MG/ML IJ SOLN
1.0000 mg | Freq: Three times a day (TID) | INTRAMUSCULAR | Status: AC | PRN
  Administered 2017-10-29 (×2): 1 mg via INTRAVENOUS
  Filled 2017-10-28 (×3): qty 1

## 2017-10-28 MED ORDER — HEPARIN SODIUM (PORCINE) 1000 UNIT/ML DIALYSIS
1000.0000 [IU] | INTRAMUSCULAR | Status: DC | PRN
  Administered 2017-10-29: 1000 [IU] via INTRAVENOUS_CENTRAL

## 2017-10-28 MED ORDER — ONDANSETRON HCL 4 MG/2ML IJ SOLN
4.0000 mg | Freq: Four times a day (QID) | INTRAMUSCULAR | Status: DC | PRN
  Administered 2017-10-28 – 2017-10-29 (×2): 4 mg via INTRAVENOUS
  Filled 2017-10-28 (×2): qty 2

## 2017-10-28 MED ORDER — LIDOCAINE-PRILOCAINE 2.5-2.5 % EX CREA: 1.0000 "application " | TOPICAL_CREAM | CUTANEOUS | Status: DC | PRN

## 2017-10-28 MED ORDER — HYDRALAZINE HCL 100 MG PO TABS: 100.0000 mg | ORAL_TABLET | Freq: Two times a day (BID) | ORAL | Status: DC

## 2017-10-28 MED ORDER — SODIUM CHLORIDE 0.9 % IV SOLN: 100.0000 mL | INTRAVENOUS | Status: DC | PRN

## 2017-10-28 MED ORDER — SODIUM CHLORIDE 0.9 % IV SOLN
125.0000 mg | INTRAVENOUS | Status: DC
  Administered 2017-10-28: 125 mg via INTRAVENOUS
  Filled 2017-10-28: qty 10

## 2017-10-28 MED ORDER — LIDOCAINE HCL (PF) 1 % IJ SOLN: 5.0000 mL | INTRAMUSCULAR | Status: DC | PRN

## 2017-10-28 MED ORDER — SODIUM CHLORIDE 0.9% FLUSH: 3.0000 mL | INTRAVENOUS | Status: DC | PRN

## 2017-10-28 MED ORDER — HEPARIN SODIUM (PORCINE) 1000 UNIT/ML DIALYSIS: 20.0000 [IU]/kg | INTRAMUSCULAR | Status: DC | PRN

## 2017-10-28 MED ORDER — DILTIAZEM HCL ER COATED BEADS 120 MG PO CP24
120.0000 mg | ORAL_CAPSULE | Freq: Every day | ORAL | Status: DC
  Administered 2017-10-29 – 2017-10-30 (×2): 120 mg via ORAL
  Filled 2017-10-28 (×2): qty 1

## 2017-10-28 MED ORDER — GABAPENTIN 100 MG PO CAPS
100.0000 mg | ORAL_CAPSULE | Freq: Two times a day (BID) | ORAL | Status: DC
  Administered 2017-10-29 – 2017-10-30 (×3): 100 mg via ORAL
  Filled 2017-10-28 (×4): qty 1

## 2017-10-28 NOTE — ED Notes (Signed)
Supplies for paracentesis ordered thru materials and management.

## 2017-10-28 NOTE — ED Notes (Signed)
Patient has removed his leads and cabling stating it "bothers him too much".

## 2017-10-28 NOTE — ED Notes (Signed)
Renal fluid restriction lunch tray ordered

## 2017-10-28 NOTE — ED Notes (Signed)
Patient refused 0800 medications stating he was told by his PCP to not take anything but what he ordered. Patient also requesting food stating he "cannot stay if I cannot get anything to eat". Admitting MD paged.

## 2017-10-28 NOTE — Procedures (Signed)
PROCEDURE SUMMARY:  Patient presents with abdominal pain.  He did recently have a paracentesis performed on 10/14/17.  Abdominal US shows only a small amount of fluid.  Attempted to position patient to create a pocket for paracentesis as a diagnostic procedure was requested by ordering MD.  Successful US guided paracentesis from left lateral abdomen yielded only 5 mL of amber fluid due to shallow fluid impeded by bowel.  No immediate complications.  Pt tolerated well.   Specimen was sent for labs.  Docia Barrier PA-C 10/28/2017 11:45 AM

## 2017-10-28 NOTE — ED Provider Notes (Signed)
[facility] EMERGENCY DEPARTMENT Provider Note   CSN: [id number] Arrival date & time: 10/27/17  1702     History   Chief Complaint Chief Complaint  Patient presents with  . Abdominal Pain  . Ascites    HPI [name] is a 57 y.o. male.  Patient presents to the emergency department for evaluation of abdominal pain.  He has a history of alcoholic cirrhosis and ascites.  He was seen in the ER 3 days ago.  At that time he had a CAT scan that showed large volume ascites, but no acute pathology.  Patient reports that since that time his abdomen has become more distended and he has having increased pain.  Pain is now severe and constant.  He has not had any fever.  He was sent to the ER by his doctor for possible paracentesis.   Abdominal Pain      Past Medical History:  Diagnosis Date  . Adenomatous colon polyp    tubular  . Anemia   . Anxiety   . Arthritis    left shoulder  . Atherosclerosis of aorta ([place])   . Cardiomegaly   . Chest pain    DATE UNKNOWN, C/O PERIODICALLY  . Cocaine abuse ([employer])   . COPD exacerbation ([place]) 08/17/2016  . Coronary artery disease    stent 02/22/17  . Dialysis patient ([place])    Monday-Wednesday-Friday  . ESRD (end stage renal disease) on dialysis ([place])    "[name]; MWF" (07/04/2017)  . GERD (gastroesophageal reflux disease)    DATE UNKNOWN  . Hemorrhoids   . Hepatitis B, chronic ([place])   . Hepatitis C   . History of kidney stones   . Hyperkalemia   . Hypertension   . Metabolic bone disease    Patient denies  . Myocardial infarction ([place])   . Pneumonia   . Pulmonary edema   . Renal disorder   . Renal insufficiency   . Shortness of breath dyspnea    " for the last past year with this dialysis"  . Solitary rectal ulcer syndrome   . Tubular adenoma of colon     Patient Active Problem List   Diagnosis Date Noted  . Abdominal pain 10/28/2017  . Upper airway cough syndrome with flattening on f/v loop  10/13/17 c/w vcd 10/17/2017  . Elevated diaphragm 10/13/2017  . Ileus ([place]) 09/29/2017  . QT prolongation 09/29/2017  . Malnutrition of moderate degree 09/29/2017  . Sinus congestion 09/03/2017  . Symptomatic anemia 09/02/2017  . Other cirrhosis of liver ([place]) 09/02/2017  . Left bundle branch block 09/02/2017  . Mitral stenosis 09/02/2017  . Hematochezia 07/15/2017  . Wide-complex tachycardia ([place])   . Endotracheally intubated   . ESRD (end stage renal disease) ([place]) 07/04/2017  . Acute respiratory failure with hypoxia ([place]) 06/18/2017  . CKD (chronic kidney disease) stage V requiring chronic dialysis ([place]) 06/18/2017  . History of Cocaine abuse ([place]) 06/18/2017  . Hypertension 06/18/2017  . Infection of AV graft for dialysis ([place]) 06/18/2017  . Anxiety 06/18/2017  . Anemia due to chronic kidney disease 06/18/2017  . Atrial flutter with rapid ventricular response ([place]) 06/18/2017  . Personality disorder ([place]) 06/13/2017  . Cellulitis 06/12/2017  . Adjustment disorder with mixed anxiety and depressed mood 06/10/2017  . Suicidal ideation 06/10/2017  . Arm wound, left, sequela 06/10/2017  . Dyspnea on exertion 05/29/2017  . Tachycardia 05/29/2017  . Hyperkalemia 05/22/2017  . Acute metabolic encephalopathy   . Anemia  04/23/2017  . Ascites 04/23/2017  . COPD (chronic obstructive pulmonary disease) ([place]) 04/23/2017  . Acute on chronic respiratory failure with hypoxia ([place]) 03/25/2017  . Arrhythmia 03/25/2017  . COPD GOLD 0 with flattening on inps f/v  09/27/2016  . Essential hypertension 09/27/2016  . Fluid overload 08/30/2016  . COPD exacerbation ([place]) 08/17/2016  . Hypertensive urgency 08/17/2016  . Respiratory failure ([place]) 08/17/2016  . Problem with dialysis access ([place]) 07/23/2016  . End stage renal disease ([place]) 12/02/2015  . Chronic hepatitis B ([place]) 03/05/2014  . Chronic hepatitis C without hepatic coma ([place]) 03/05/2014  . Internal hemorrhoids with bleeding, swelling  and itching 03/05/2014  . Thrombocytopenia ([place]) 03/05/2014  . Chest pain 02/27/2014  . Alcohol abuse 04/14/2009  . Cigarette smoker 04/14/2009  . GANGLION CYST 04/14/2009    Past Surgical History:  Procedure Laterality Date  . A/V FISTULAGRAM Left 05/26/2017   Procedure: A/V FISTULAGRAM;  Surgeon: Conrad , MD;  Location: [facility];  Service: Cardiovascular;  Laterality: Left;  . APPLICATION OF WOUND VAC Left 06/14/2017   Procedure: APPLICATION OF WOUND VAC;  Surgeon: Katha Cabal, MD;  Location: [facility];  Service: Vascular;  Laterality: Left;  . AV FISTULA PLACEMENT  2012   BELIEVED WAS PLACED IN JUNE  . AV FISTULA PLACEMENT Right 08/09/2017   Procedure: Creation Right arm ARTERIOVENOUS BRACHIOCEPOHALIC FISTULA;  Surgeon: Elam Dutch, MD;  Location: [facility];  Service: Vascular;  Laterality: Right;  . COLONOSCOPY    . CORONARY STENT INTERVENTION N/A 02/22/2017   Procedure: CORONARY STENT INTERVENTION;  Surgeon: Nigel Mormon, MD;  Location: [facility];  Service: Cardiovascular;  Laterality: N/A;  . FLEXIBLE SIGMOIDOSCOPY N/A 07/15/2017   Procedure: FLEXIBLE SIGMOIDOSCOPY;  Surgeon: Carol Ada, MD;  Location: [facility];  Service: Endoscopy;  Laterality: N/A;  . HEMORRHOID BANDING    . I&D EXTREMITY Left 06/01/2017   Procedure: IRRIGATION AND DEBRIDEMENT LEFT ARM HEMATOMA WITH LIGATION OF LEFT ARM AV FISTULA;  Surgeon: Elam Dutch, MD;  Location: [facility];  Service: Vascular;  Laterality: Left;  . I&D EXTREMITY Left 06/14/2017   Procedure: IRRIGATION AND DEBRIDEMENT EXTREMITY;  Surgeon: Katha Cabal, MD;  Location: [facility];  Service: Vascular;  Laterality: Left;  . INSERTION OF DIALYSIS CATHETER  05/30/2017  . INSERTION OF DIALYSIS CATHETER N/A 05/30/2017   Procedure: INSERTION OF DIALYSIS CATHETER;  Surgeon: Elam Dutch, MD;  Location: [facility];  Service: Vascular;  Laterality: N/A;  . IR PARACENTESIS  08/30/2017  . IR PARACENTESIS   09/29/2017  . LEFT HEART CATH AND CORONARY ANGIOGRAPHY N/A 02/22/2017   Procedure: LEFT HEART CATH AND CORONARY ANGIOGRAPHY;  Surgeon: Nigel Mormon, MD;  Location: [facility];  Service: Cardiovascular;  Laterality: N/A;  . LIGATION OF ARTERIOVENOUS  FISTULA Left 0/02/1447   Procedure: Plication of Left Arm Arteriovenous Fistula;  Surgeon: Elam Dutch, MD;  Location: [facility];  Service: Vascular;  Laterality: Left;  . POLYPECTOMY    . REVISON OF ARTERIOVENOUS FISTULA Left 1/85/6314   Procedure: PLICATION OF DISTAL ANEURYSMAL SEGEMENT OF LEFT UPPER ARM ARTERIOVENOUS FISTULA;  Surgeon: Elam Dutch, MD;  Location: [facility];  Service: Vascular;  Laterality: Left;  . REVISON OF ARTERIOVENOUS FISTULA Left 9/70/2637   Procedure: Plication of Left Upper Arm Fistula ;  Surgeon: Waynetta Sandy, MD;  Location: [facility];  Service: Vascular;  Laterality: Left;  . SKIN GRAFT SPLIT THICKNESS LEG / FOOT Left  SKIN GRAFT SPLIT THICKNESS LEFT ARM DONOR SITE: LEFT ANTERIOR THIGH  . SKIN SPLIT GRAFT Left 07/04/2017   Procedure: SKIN GRAFT SPLIT THICKNESS LEFT ARM DONOR SITE: LEFT ANTERIOR THIGH;  Surgeon: Elam Dutch, MD;  Location: [facility];  Service: Vascular;  Laterality: Left;  . THROMBECTOMY W/ EMBOLECTOMY Left 06/05/2017   Procedure: EXPLORATION OF LEFT ARM FOR BLEEDING; OVERSEWED PROXIMAL FISTULA;  Surgeon: Angelia Mould, MD;  Location: [facility];  Service: Vascular;  Laterality: Left;  . WOUND EXPLORATION Left 06/03/2017   Procedure: WOUND EXPLORATION WITH WOUND VAC APPLICATION TO LEFT ARM;  Surgeon: Angelia Mould, MD;  Location: [facility];  Service: Vascular;  Laterality: Left;        Home Medications    Prior to Admission medications   Medication Sig Start Date End Date Taking? Authorizing Provider  albuterol (VENTOLIN HFA) 108 (90 Base) MCG/ACT inhaler Inhale 2 puffs into the lungs every 6 (six) hours as needed for wheezing or shortness of breath.   Yes  [provider]  budesonide-formoterol (SYMBICORT) 80-4.5 MCG/ACT inhaler Inhale 2 puffs into the lungs 2 (two) times daily. 10/13/17  Yes Tanda Rockers, MD  diltiazem (CARDIZEM CD) 120 MG 24 hr capsule Take 1 capsule (120 mg total) by mouth daily. 06/23/17 06/23/18 Yes Shahmehdi, Valeria Batman, MD  gabapentin (NEURONTIN) 100 MG capsule Take 1 capsule (100 mg total) by mouth at bedtime. Patient taking differently: Take 100 mg by mouth 2 (two) times daily.  09/30/17  Yes Mariel Aloe, MD  hydrALAZINE (APRESOLINE) 100 MG tablet Take 1 tablet by mouth twice a day take after HD on dialysis days 08/31/17  Yes [provider]  oxyCODONE (OXY IR/ROXICODONE) 5 MG immediate release tablet Take 5 mg by mouth 2 (two) times daily as needed for moderate pain.  09/22/17  Yes [provider]  clopidogrel (PLAVIX) 75 MG tablet Take 1 tablet (75 mg total) by mouth daily. 07/15/17   Rhyne, Hulen Shouts, PA-C  famotidine (PEPCID) 20 MG tablet One at bedtime Patient taking differently: Take 20 mg by mouth at bedtime.  09/08/17   Tanda Rockers, MD  isosorbide mononitrate (IMDUR) 30 MG 24 hr tablet Take 1 tablet (30 mg total) by mouth daily. 06/17/17   [name], MD  Nutritional Supplements (FEEDING SUPPLEMENT, NEPRO CARB STEADY,) LIQD Take 237 mLs by mouth 2 (two) times daily between meals. 09/30/17   Mariel Aloe, MD  pantoprazole (PROTONIX) 40 MG tablet Take 1 tablet (40 mg total) by mouth daily. Take 30-60 min before first meal of the day 09/08/17   Tanda Rockers, MD  SPIRIVA HANDIHALER 18 MCG inhalation capsule Place 1 puff into inhaler and inhale daily. 10/04/17   [provider]    Family History Family History  Problem Relation Age of Onset  . Heart disease Mother   . Lung cancer Mother   . Heart disease Father   . Malignant hyperthermia Father   . COPD Father   . Throat cancer Sister   . Esophageal cancer Sister   . Hypertension Other   . COPD Other   . Colon cancer Neg Hx    . Colon polyps Neg Hx   . Rectal cancer Neg Hx   . Stomach cancer Neg Hx     Social History Social History   Tobacco Use  . Smoking status: Current Every Day Smoker    Packs/day: 0.50    Years: 43.00    Pack years: 21.50  Types: Cigarettes    Start date: 08/13/1973  . Smokeless tobacco: Never Used  Substance Use Topics  . Alcohol use: Yes    Frequency: Never    Comment: quit drinking in 2017  . Drug use: Yes    Types: Marijuana, Cocaine    Comment: quit in 2017"     Allergies   Aspirin; Tylenol [acetaminophen]; Clonidine derivatives; and Tramadol   Review of Systems Review of Systems  Gastrointestinal: Positive for abdominal pain.  All other systems reviewed and are negative.    Physical Exam Updated Vital Signs BP 129/89   Pulse (!) 104   Temp 98.9 F (37.2 C) (Oral)   Resp (!) 25   SpO2 94%   Physical Exam  Constitutional: He is oriented to person, place, and time. He appears well-developed and well-nourished. No distress.  HENT:  Head: Normocephalic and atraumatic.  Right Ear: Hearing normal.  Left Ear: Hearing normal.  Nose: Nose normal.  Mouth/Throat: Oropharynx is clear and moist and mucous membranes are normal.  Eyes: Pupils are equal, round, and reactive to light. Conjunctivae and EOM are normal.  Neck: Normal range of motion. Neck supple.  Cardiovascular: Regular rhythm, S1 normal and S2 normal. Exam reveals no gallop and no friction rub.  No murmur heard. Pulmonary/Chest: Effort normal and breath sounds normal. No respiratory distress. He exhibits no tenderness.  Abdominal: Soft. Normal appearance and bowel sounds are normal. He exhibits distension. There is no hepatosplenomegaly. There is generalized tenderness. There is rebound. There is no guarding, no tenderness at McBurney's point and negative Murphy's sign. No hernia.  Musculoskeletal: Normal range of motion.  Neurological: He is alert and oriented to person, place, and time. He has  normal strength. No cranial nerve deficit or sensory deficit. Coordination normal. GCS eye subscore is 4. GCS verbal subscore is 5. GCS motor subscore is 6.  Skin: Skin is warm, dry and intact. No rash noted. No cyanosis.  Psychiatric: He has a normal mood and affect. His speech is normal and behavior is normal. Thought content normal.  Nursing note and vitals reviewed.    ED Treatments / Results  Labs (all labs ordered are listed, but only abnormal results are displayed) Labs Reviewed  COMPREHENSIVE METABOLIC PANEL - Abnormal; Notable for the following components:      Result Value   Sodium 132 (*)    Chloride 91 (*)    Glucose, Bld 129 (*)    BUN 35 (*)    Creatinine, Ser 6.78 (*)    Albumin 3.1 (*)    Alkaline Phosphatase 170 (*)    GFR calc non Af Amer 8 (*)    GFR calc Af Amer 9 (*)    All other components within normal limits  CBC - Abnormal; Notable for the following components:   RBC 3.76 (*)    Hemoglobin 9.6 (*)    HCT 28.6 (*)    MCV 76.1 (*)    MCH 25.5 (*)    RDW 20.3 (*)    All other components within normal limits  PROTIME-INR - Abnormal; Notable for the following components:   Prothrombin Time 15.6 (*)    All other components within normal limits  BODY FLUID CULTURE  LIPASE, BLOOD  BRAIN NATRIURETIC PEPTIDE  LACTATE DEHYDROGENASE, PLEURAL OR PERITONEAL FLUID  GLUCOSE, PLEURAL OR PERITONEAL FLUID  PROTEIN, PLEURAL OR PERITONEAL FLUID  ALBUMIN, PLEURAL OR PERITONEAL FLUID    EKG None  Radiology Dg Chest 2 View  Result Date: 10/27/2017 CLINICAL  DATA:  Sharp abdominal pain for 3 days.  Dyspnea. EXAM: CHEST - 2 VIEW COMPARISON:  10/03/2017 FINDINGS: Stable cardiomegaly with aortic atherosclerosis. Dialysis catheter tip from right IJ approach terminates in the mid right atrium. Interstitial edema is noted with left basilar atelectasis and chronic elevation of the left hemidiaphragm. No acute nor suspicious osseous abnormality. IMPRESSION: 1. Stable  cardiomegaly with mild interstitial pulmonary edema, slightly worse than on comparison. No effusion or pneumothorax. 2. Aortic atherosclerosis without aneurysm. Electronically Signed   By: Ashley Royalty M.D.   On: 10/27/2017 18:45    Procedures Procedures (including critical care time)  Medications Ordered in ED Medications  lidocaine (XYLOCAINE) 2 % (with pres) injection 400 mg (has no administration in time range)  cefTRIAXone (ROCEPHIN) 2 g in sodium chloride 0.9 % 100 mL IVPB (2 g Intravenous New Bag/Given 10/28/17 0638)  diltiazem (CARDIZEM CD) 24 hr capsule 120 mg (has no administration in time range)  gabapentin (NEURONTIN) capsule 100 mg (has no administration in time range)  isosorbide mononitrate (IMDUR) 24 hr tablet 30 mg (has no administration in time range)  feeding supplement (NEPRO CARB STEADY) liquid 237 mL (has no administration in time range)  tiotropium (SPIRIVA) inhalation capsule 18 mcg (has no administration in time range)  sodium chloride flush (NS) 0.9 % injection 3 mL (has no administration in time range)  sodium chloride flush (NS) 0.9 % injection 3 mL (has no administration in time range)  0.9 %  sodium chloride infusion (has no administration in time range)  ondansetron (ZOFRAN) tablet 4 mg (has no administration in time range)    Or  ondansetron (ZOFRAN) injection 4 mg (has no administration in time range)  hydrALAZINE (APRESOLINE) tablet 100 mg (has no administration in time range)  albuterol (PROVENTIL) (2.5 MG/3ML) 0.083% nebulizer solution 2.5 mg (has no administration in time range)  HYDROmorphone (DILAUDID) injection 1 mg (1 mg Intravenous Given 10/28/17 0214)  HYDROmorphone (DILAUDID) injection 1 mg (1 mg Intravenous Given 10/28/17 0546)     Initial Impression / Assessment and Plan / ED Course  I have reviewed the triage vital signs and the nursing notes.  Pertinent labs & imaging results that were available during my care of the patient were reviewed by  me and considered in my medical decision making (see chart for details).     Patient presents to the ER for evaluation of progressive abdominal distention and pain.  He has a history of alcoholic cirrhosis and ascites.Patient was seen 2 days ago and had a CAT scan.  At that time he had no acute pathology other than large volume ascites.  Records indicate that his examination at that time only showed mild tenderness.He is distended today, has generalized tenderness which seems worse than what was described previously and he does have some mild rebound.  I do not know if this is just because of large volume ascites or if he does have SBP.  I had originally intended to perform paracentesis, but time constraints did not allow this.  Patient therefore initiated on empiric antibiotic coverage. We will have hospitalist admit patient.  Will order ultrasound-guided paracentesis for later this morning.  Final Clinical Impressions(s) / ED Diagnoses   Final diagnoses:  Ascites  Generalized abdominal pain  Ascites due to alcoholic cirrhosis [facility])    ED Discharge Orders    None       Orpah Greek, MD 10/28/17 [phone]

## 2017-10-28 NOTE — ED Notes (Signed)
Patient angry because of NPO diet and states he needs more pain medicine than is prescribed. Patient demands to speak to the doctor in person. Admit MD team notified.

## 2017-10-28 NOTE — H&P (Addendum)
History and Physical    [name] ZHY:[id number] DOB: Oct 16, 1960 DOA: 10/27/2017  PCP: [name], MD Patient coming from: home  Chief Complaint: abdominal pain  HPI: [name] is a 57 y.o. male with medical history significant for end-stage renal disease on hemodialysis Monday Wednesday Friday, cirrhosis secondary to hepatitis C, hepatitis B and alcohol abuse, polysubstance abuse with history of suicide ideation, atrial flutter with RVR, COPD, presents to the emergency department with a chief complaint of abdominal pain. Of note this is his 12th admission over the last 6 months. Patient had a CT of the abdomen pelvis 2 days ago revealing a large volume ascites he was referred to the ED for paracentesis. [facility] were asked to admit.  Information is obtained from the chart and the patient.he was seen in the ER 3 days ago. At that time he had a CT scan that showed large volume ascites. He reports since that time his abdomen has just continued to become more distended and abdominal pain worsening. He reports the pain is constant describes it as a pressure/ache located throughout his abdomen. He also reports a tendency towards constipation and has not had a bowel movement in 2 days. He denies any fever or headache nausea vomiting. He reports intermittent nausea without vomiting and a decreased oral intake. He denies chest pain palpitation but does endorse some worsening shortness of breath. He says he goes to dialysis Monday Wednesday and Friday and his last dialysis session was 2 days ago.   ED Course: in the emergency department is afebrile hemodynamically stable with mild tachycardias oxygen saturation levels greater then 90% on room air.  Review of Systems: As per HPI otherwise all other systems reviewed and are negative.   Ambulatory Status: ambulates with cane.independant with adls  Past Medical History:  Diagnosis Date  . Adenomatous colon polyp      tubular  . Anemia   . Anxiety   . Arthritis    left shoulder  . Atherosclerosis of aorta ([place])   . Cardiomegaly   . Chest pain    DATE UNKNOWN, C/O PERIODICALLY  . Cocaine abuse ([place])   . COPD exacerbation ([place]) 08/17/2016  . Coronary artery disease    stent 02/22/17  . Dialysis patient ([place])    Monday-Wednesday-Friday  . ESRD (end stage renal disease) on dialysis ([place])    "[name]; MWF" (07/04/2017)  . GERD (gastroesophageal reflux disease)    DATE UNKNOWN  . Hemorrhoids   . Hepatitis B, chronic ([place])   . Hepatitis C   . History of kidney stones   . Hyperkalemia   . Hypertension   . Metabolic bone disease    Patient denies  . Myocardial infarction ([place])   . Pneumonia   . Pulmonary edema   . Renal disorder   . Renal insufficiency   . Shortness of breath dyspnea    " for the last past year with this dialysis"  . Solitary rectal ulcer syndrome   . Tubular adenoma of colon     Past Surgical History:  Procedure Laterality Date  . A/V FISTULAGRAM Left 05/26/2017   Procedure: A/V FISTULAGRAM;  Surgeon: Conrad Papineau, MD;  Location: [facility];  Service: Cardiovascular;  Laterality: Left;  . APPLICATION OF WOUND VAC Left 06/14/2017   Procedure: APPLICATION OF WOUND VAC;  Surgeon: Katha Cabal, MD;  Location: [facility];  Service: Vascular;  Laterality: Left;  . AV FISTULA PLACEMENT  2012   BELIEVED  WAS PLACED IN JUNE  . AV FISTULA PLACEMENT Right 08/09/2017   Procedure: Creation Right arm ARTERIOVENOUS BRACHIOCEPOHALIC FISTULA;  Surgeon: Elam Dutch, MD;  Location: [facility];  Service: Vascular;  Laterality: Right;  . COLONOSCOPY    . CORONARY STENT INTERVENTION N/A 02/22/2017   Procedure: CORONARY STENT INTERVENTION;  Surgeon: Nigel Mormon, MD;  Location: [facility];  Service: Cardiovascular;  Laterality: N/A;  . FLEXIBLE SIGMOIDOSCOPY N/A 07/15/2017   Procedure: FLEXIBLE SIGMOIDOSCOPY;  Surgeon: Carol Ada, MD;  Location: [facility];   Service: Endoscopy;  Laterality: N/A;  . HEMORRHOID BANDING    . I&D EXTREMITY Left 06/01/2017   Procedure: IRRIGATION AND DEBRIDEMENT LEFT ARM HEMATOMA WITH LIGATION OF LEFT ARM AV FISTULA;  Surgeon: Elam Dutch, MD;  Location: [facility];  Service: Vascular;  Laterality: Left;  . I&D EXTREMITY Left 06/14/2017   Procedure: IRRIGATION AND DEBRIDEMENT EXTREMITY;  Surgeon: Katha Cabal, MD;  Location: [facility];  Service: Vascular;  Laterality: Left;  . INSERTION OF DIALYSIS CATHETER  05/30/2017  . INSERTION OF DIALYSIS CATHETER N/A 05/30/2017   Procedure: INSERTION OF DIALYSIS CATHETER;  Surgeon: Elam Dutch, MD;  Location: [facility];  Service: Vascular;  Laterality: N/A;  . IR PARACENTESIS  08/30/2017  . IR PARACENTESIS  09/29/2017  . LEFT HEART CATH AND CORONARY ANGIOGRAPHY N/A 02/22/2017   Procedure: LEFT HEART CATH AND CORONARY ANGIOGRAPHY;  Surgeon: Nigel Mormon, MD;  Location: [facility];  Service: Cardiovascular;  Laterality: N/A;  . LIGATION OF ARTERIOVENOUS  FISTULA Left 02/13/1659   Procedure: Plication of Left Arm Arteriovenous Fistula;  Surgeon: Elam Dutch, MD;  Location: [place];  Service: Vascular;  Laterality: Left;  . POLYPECTOMY    . REVISON OF ARTERIOVENOUS FISTULA Left 01/09/1600   Procedure: PLICATION OF DISTAL ANEURYSMAL SEGEMENT OF LEFT UPPER ARM ARTERIOVENOUS FISTULA;  Surgeon: Elam Dutch, MD;  Location: [place];  Service: Vascular;  Laterality: Left;  . REVISON OF ARTERIOVENOUS FISTULA Left 0/93/2355   Procedure: Plication of Left Upper Arm Fistula ;  Surgeon: Waynetta Sandy, MD;  Location: [place];  Service: Vascular;  Laterality: Left;  . SKIN GRAFT SPLIT THICKNESS LEG / FOOT Left    SKIN GRAFT SPLIT THICKNESS LEFT ARM DONOR SITE: LEFT ANTERIOR THIGH  . SKIN SPLIT GRAFT Left 07/04/2017   Procedure: SKIN GRAFT SPLIT THICKNESS LEFT ARM DONOR SITE: LEFT ANTERIOR THIGH;  Surgeon: Elam Dutch, MD;  Location: [place];  Service: Vascular;   Laterality: Left;  . THROMBECTOMY W/ EMBOLECTOMY Left 06/05/2017   Procedure: EXPLORATION OF LEFT ARM FOR BLEEDING; OVERSEWED PROXIMAL FISTULA;  Surgeon: Angelia Mould, MD;  Location: [place] [facility];  Service: Vascular;  Laterality: Left;  . WOUND EXPLORATION Left 06/03/2017   Procedure: WOUND EXPLORATION WITH WOUND VAC APPLICATION TO LEFT ARM;  Surgeon: Angelia Mould, MD;  Location: [facility];  Service: Vascular;  Laterality: Left;    Social History   Socioeconomic History  . Marital status: Single    Spouse name: Not on file  . Number of children: 3  . Years of education: 10  . Highest education level: Not on file  Occupational History  . Occupation: Unemployed  Social Needs  . Financial resource strain: Not on file  . Food insecurity:    Worry: Not on file    Inability: Not on file  . Transportation needs:    Medical: Not on file    Non-medical: Not on file  Tobacco  Use  . Smoking status: Current Every Day Smoker    Packs/day: 0.50    Years: 43.00    Pack years: 21.50    Types: Cigarettes    Start date: 08/13/1973  . Smokeless tobacco: Never Used  Substance and Sexual Activity  . Alcohol use: Yes    Frequency: Never    Comment: quit drinking in 2017  . Drug use: Yes    Types: Marijuana, Cocaine    Comment: quit in 2017"  . Sexual activity: Not on file    Comment: "NOT LATELY" on cocaine  Lifestyle  . Physical activity:    Days per week: Not on file    Minutes per session: Not on file  . Stress: Not on file  Relationships  . Social connections:    Talks on phone: Not on file    Gets together: Not on file    Attends religious service: Not on file    Active member of club or organization: Not on file    Attends meetings of clubs or organizations: Not on file    Relationship status: Not on file  . Intimate partner violence:    Fear of current or ex partner: Not on file    Emotionally abused: Not on file    Physically abused: Not on file    Forced sexual  activity: Not on file  Other Topics Concern  . Not on file  Social History Narrative   Lives alone   Caffeine use: Coffee-rare   Soda- daily      [facility] (03/10/17):   Originally from [facility]. Previously worked trimming trees. No pets currently. No bird or mold exposure.     Allergies  Allergen Reactions  . Aspirin Other (See Comments)    STOMACH PAIN  . Tylenol [Acetaminophen]     Stomach ache  . Clonidine Derivatives Itching  . Tramadol Itching    Family History  Problem Relation Age of Onset  . Heart disease Mother   . Lung cancer Mother   . Heart disease Father   . Malignant hyperthermia Father   . COPD Father   . Throat cancer Sister   . Esophageal cancer Sister   . Hypertension Other   . COPD Other   . Colon cancer Neg Hx   . Colon polyps Neg Hx   . Rectal cancer Neg Hx   . Stomach cancer Neg Hx     Prior to Admission medications   Medication Sig Start Date End Date Taking? Authorizing Provider  albuterol (VENTOLIN HFA) 108 (90 Base) MCG/ACT inhaler Inhale 2 puffs into the lungs every 6 (six) hours as needed for wheezing or shortness of breath.   Yes [provider]  budesonide-formoterol (SYMBICORT) 80-4.5 MCG/ACT inhaler Inhale 2 puffs into the lungs 2 (two) times daily. 10/13/17  Yes Tanda Rockers, MD  diltiazem (CARDIZEM CD) 120 MG 24 hr capsule Take 1 capsule (120 mg total) by mouth daily. 06/23/17 06/23/18 Yes Shahmehdi, Valeria Batman, MD  gabapentin (NEURONTIN) 100 MG capsule Take 1 capsule (100 mg total) by mouth at bedtime. Patient taking differently: Take 100 mg by mouth 2 (two) times daily.  09/30/17  Yes Mariel Aloe, MD  hydrALAZINE (APRESOLINE) 100 MG tablet Take 1 tablet by mouth twice a day take after HD on dialysis days 08/31/17  Yes [provider]  oxyCODONE (OXY IR/ROXICODONE) 5 MG immediate release tablet Take 5 mg by mouth 2 (two) times daily as needed for moderate pain.  09/22/17  Yes [provider]  clopidogrel  (PLAVIX) 75 MG tablet Take 1 tablet (75 mg total) by mouth daily. 07/15/17   Rhyne, Hulen Shouts, PA-C  famotidine (PEPCID) 20 MG tablet One at bedtime Patient taking differently: Take 20 mg by mouth at bedtime.  09/08/17   Tanda Rockers, MD  isosorbide mononitrate (IMDUR) 30 MG 24 hr tablet Take 1 tablet (30 mg total) by mouth daily. 06/17/17   [name], MD  Nutritional Supplements (FEEDING SUPPLEMENT, NEPRO CARB STEADY,) LIQD Take 237 mLs by mouth 2 (two) times daily between meals. 09/30/17   Mariel Aloe, MD  pantoprazole (PROTONIX) 40 MG tablet Take 1 tablet (40 mg total) by mouth daily. Take 30-60 min before first meal of the day 09/08/17   Tanda Rockers, MD  SPIRIVA HANDIHALER 18 MCG inhalation capsule Place 1 puff into inhaler and inhale daily. 10/04/17   [provider]    Physical Exam: Vitals:   10/28/17 0413 10/28/17 0600 10/28/17 0612 10/28/17 0644  BP: 122/78 127/67 127/67 127/67  Pulse: 95 (!) 102 (!) 101 (!) 107  Resp: 20 (!) 22  20  Temp:      TempSrc:      SpO2: 93% 92%  91%     General:  Appears calm and comfortable in no acute distress Eyes:  PERRL, EOMI, normal lids, iris ENT:  grossly normal hearing, lips & tongue, mmm Neck:  no LAD, masses or thyromegaly Cardiovascular:  Tachycardia but regular +murmur. No LE edema. Dialysis catheter in right chest Respiratory:  CTA bilaterally, no w/r/r. Normal respiratory effort. Abdomen:  soft, distended slightly firm +BS diffuse tenderness to palpaion no guarding or rebounding Skin:  no rash or induration seen on limited exam Musculoskeletal:  grossly normal tone BUE/BLE, good ROM, no bony abnormality Psychiatric:  grossly normal mood and affect, speech fluent and appropriate, AOx3 Neurologic:  CN 2-12 grossly intact, moves all extremities in coordinated fashion, sensation intact  Labs on Admission: I have personally reviewed following labs and imaging studies  CBC: Recent Labs  Lab 10/25/17 0754 10/27/17 1708   WBC 8.8 9.7  HGB 9.8* 9.6*  HCT 28.8* 28.6*  MCV 76.4* 76.1*  PLT 218 793   Basic Metabolic Panel: Recent Labs  Lab 10/25/17 0754 10/27/17 1708  NA 139 132*  K 3.0* 5.0  CL 116* 91*  CO2 16* 28  GLUCOSE 44* 129*  BUN 17 35*  CREATININE 2.65* 6.78*  CALCIUM 5.0* 9.0   GFR: Estimated Creatinine Clearance: 11.4 mL/min (A) (by C-G formula based on SCr of 6.78 mg/dL (H)). Liver Function Tests: Recent Labs  Lab 10/25/17 0754 10/27/17 1708  AST 19 25  ALT 16* 19  ALKPHOS 98 170*  BILITOT 0.4 0.8  PROT 3.5* 6.6  ALBUMIN 1.7* 3.1*   Recent Labs  Lab 10/25/17 0754 10/27/17 1708  LIPASE 20 28   No results for input(s): AMMONIA in the last 168 hours. Coagulation Profile: Recent Labs  Lab 10/28/17 0215  INR 1.25   Cardiac Enzymes: No results for input(s): CKTOTAL, CKMB, CKMBINDEX, TROPONINI in the last 168 hours. BNP (last 3 results) No results for input(s): PROBNP in the last 8760 hours. HbA1C: No results for input(s): HGBA1C in the last 72 hours. CBG: Recent Labs  Lab 10/25/17 0927  GLUCAP 155*   Lipid Profile: No results for input(s): CHOL, HDL, LDLCALC, TRIG, CHOLHDL, LDLDIRECT in the last 72 hours. Thyroid Function Tests: No results for input(s): TSH, T4TOTAL, FREET4, T3FREE, THYROIDAB in the  last 72 hours. Anemia Panel: No results for input(s): VITAMINB12, FOLATE, FERRITIN, TIBC, IRON, RETICCTPCT in the last 72 hours. Urine analysis:    Component Value Date/Time   COLORURINE YELLOW 07/16/2011 1619   APPEARANCEUR CLEAR 07/16/2011 1619   LABSPEC 1.018 07/16/2011 1619   PHURINE 7.5 07/16/2011 1619   GLUCOSEU 250 (A) 07/16/2011 1619   HGBUR MODERATE (A) 07/16/2011 1619   BILIRUBINUR SMALL (A) 07/16/2011 1619   KETONESUR NEGATIVE 07/16/2011 1619   PROTEINUR >300 (A) 07/16/2011 1619   UROBILINOGEN 1.0 07/16/2011 1619   NITRITE NEGATIVE 07/16/2011 1619   LEUKOCYTESUR SMALL (A) 07/16/2011 1619    Creatinine Clearance: Estimated Creatinine  Clearance: 11.4 mL/min (A) (by C-G formula based on SCr of 6.78 mg/dL (H)).  Sepsis Labs: _0 (procalcitonin:4,lacticidven:4) )No results found for this or any previous visit (from the past 240 hour(s)).   Radiological Exams on Admission: Dg Chest 2 View  Result Date: 10/27/2017 CLINICAL DATA:  Sharp abdominal pain for 3 days.  Dyspnea. EXAM: CHEST - 2 VIEW COMPARISON:  10/03/2017 FINDINGS: Stable cardiomegaly with aortic atherosclerosis. Dialysis catheter tip from right IJ approach terminates in the mid right atrium. Interstitial edema is noted with left basilar atelectasis and chronic elevation of the left hemidiaphragm. No acute nor suspicious osseous abnormality. IMPRESSION: 1. Stable cardiomegaly with mild interstitial pulmonary edema, slightly worse than on comparison. No effusion or pneumothorax. 2. Aortic atherosclerosis without aneurysm. Electronically Signed   By: Ashley Royalty M.D.   On: 10/27/2017 18:45    EKG: Normal sinus rhythm Left axis deviation Left bundle branch block Abnormal ECG  Assessment/Plan Principal Problem:   Ascites Active Problems:   Cigarette smoker   Chronic hepatitis B ([facility])   Essential hypertension   COPD (chronic obstructive pulmonary disease) ([facility])   Adjustment disorder with mixed anxiety and depressed mood   CKD (chronic kidney disease) stage V requiring chronic dialysis ([place])   Anemia due to chronic kidney disease   Abdominal pain   1. Ascities/abdominal pain in setting of cirrhosis. CT of the abdomen and pelvis she 2 days ago reveals large volume ascites. Chart review indicates patient has required monthly paracentesis for the last 3 months. Most recent last month yielding 1.6 L. No indications SBP at time of admission. Rocephin given in ED. Alk phosphatase elevated total bili 0.8. I placed within the limits of normal, INR 1.2. -admit -Interventional radiology for therapeutic paracentesis. Previous cytology negative. Will repeart -holding  plavix for now  2.cirrhosis secondary to hepatitis B, hepatitis C and alcoholism. LFT's with elevated alk phos otherwise WNL. -hx non-compliance -continues to drink  #3. End-stage renal disease. Typically Monday Wednesday Friday dialysis. He reports completing dialysis 2 days ago.creatinine 6.2 on admission. -will request nephrology consult for dialysis  #4. COPD. More short of breath than usual secondary to #1.chest x-ray with stable cardiomegaly with mild interstitial pulmonary edema slightly worse on comparison. He continues to smoke. Not on home oxygen. oxygen saturation level greater than 90% on room air -Paracentesis as noted above -nebs prn -dialysis per nephrology -oxygen supplementation as indicated -monitor  #5. CAD status post mid RCA. PCI 02/2017. No chest pain. EKG as noted above. Home medications include Plavix was held last admission as he is more than 6 months post stent also with hx aflutter -continue imdur and cardizem  #6. Hypertension.blood pressure elevated. Home medications include Cardizem, hydralazine, imdur -continue home meds -monitor  #7. Polysubstance abuse. -social work  #8. Anemia chronic disease. Hg 9.6. Appears to be slightly below baseline.  No s/sx active bleeding.   #9. Moderate mitral stenosis. Echo last month.  -OP follow up    DVT prophylaxis: heparin after paracentesis Code Status: full  Family Communication: none  Disposition Plan: home  Consults called: nephrology  Admission status: inpatient    Radene Gunning MD [facility]  If 7PM-7AM, please contact night-coverage [email or web address] Password [email or web address]  10/28/2017, 7:03 AM

## 2017-10-28 NOTE — ED Notes (Signed)
Admit team in room to talk with patient. Per NP, brought patient sack lunch and patient states he does not want that food, he wants a hot meal. Patient refused food and drink offered.

## 2017-10-28 NOTE — Consult Note (Addendum)
[facility] Renal Consultation Note    Indication for Consultation:  Management of ESRD/hemodialysis; anemia, hypertension/volume and secondary hyperparathyroidism PCP:  HPI: [name] is a 57 y.o. male with ESRD on HD ([place] MWF), HTN, COPD, CAD s/p PCI, HepB/C, cirrhosis with recurrent ascites, hx substance abuse.   Presents to ED this morning  with SOB and abdominal pain. Last paracentesis on 4/5 with 3.7L removed. Abd CT from 4/16 showing large volume ascites and he is admitted for IR paracentesis. CXR also significant for mild pulmonary Insterstitial edema.  Biggest complaint is abdominal pain. No fevers, chills, CP, vomiting, diarrhea.   Last dialyzed 4/17, completed only 2 hours of dialysis and left 1.1kg over EDW. Has been attending all treaments but does cut time occasinally. Using [facility] while new R AVF matures.   Past Medical History:  Diagnosis Date  . Adenomatous colon polyp    tubular  . Anemia   . Anxiety   . Arthritis    left shoulder  . Atherosclerosis of aorta ([place])   . Cardiomegaly   . Chest pain    DATE UNKNOWN, C/O PERIODICALLY  . Cocaine abuse ([place])   . COPD exacerbation ([place]) 08/17/2016  . Coronary artery disease    stent 02/22/17  . Dialysis patient ([place])    Monday-Wednesday-Friday  . ESRD (end stage renal disease) on dialysis ([place])    "[name]; MWF" (07/04/2017)  . GERD (gastroesophageal reflux disease)    DATE UNKNOWN  . Hemorrhoids   . Hepatitis B, chronic ([place])   . Hepatitis C   . History of kidney stones   . Hyperkalemia   . Hypertension   . Metabolic bone disease    Patient denies  . Mitral stenosis   . Myocardial infarction ([place])   . Pneumonia   . Pulmonary edema   . Renal disorder   . Renal insufficiency   . Shortness of breath dyspnea    " for the last past year with this dialysis"  . Solitary rectal ulcer syndrome   . Tubular adenoma of colon    Past Surgical History:  Procedure  Laterality Date  . A/V FISTULAGRAM Left 05/26/2017   Procedure: A/V FISTULAGRAM;  Surgeon: Conrad Turnerville, MD;  Location: [place] [facility];  Service: Cardiovascular;  Laterality: Left;  . APPLICATION OF WOUND VAC Left 06/14/2017   Procedure: APPLICATION OF WOUND VAC;  Surgeon: Katha Cabal, MD;  Location: [facility];  Service: Vascular;  Laterality: Left;  . AV FISTULA PLACEMENT  2012   BELIEVED WAS PLACED IN JUNE  . AV FISTULA PLACEMENT Right 08/09/2017   Procedure: Creation Right arm ARTERIOVENOUS BRACHIOCEPOHALIC FISTULA;  Surgeon: Elam Dutch, MD;  Location: [place];  Service: Vascular;  Laterality: Right;  . COLONOSCOPY    . CORONARY STENT INTERVENTION N/A 02/22/2017   Procedure: CORONARY STENT INTERVENTION;  Surgeon: Nigel Mormon, MD;  Location: [place] [facility];  Service: Cardiovascular;  Laterality: N/A;  . FLEXIBLE SIGMOIDOSCOPY N/A 07/15/2017   Procedure: FLEXIBLE SIGMOIDOSCOPY;  Surgeon: Carol Ada, MD;  Location: [place];  Service: Endoscopy;  Laterality: N/A;  . HEMORRHOID BANDING    . I&D EXTREMITY Left 06/01/2017   Procedure: IRRIGATION AND DEBRIDEMENT LEFT ARM HEMATOMA WITH LIGATION OF LEFT ARM AV FISTULA;  Surgeon: Elam Dutch, MD;  Location: [place];  Service: Vascular;  Laterality: Left;  . I&D EXTREMITY Left 06/14/2017   Procedure: IRRIGATION AND DEBRIDEMENT EXTREMITY;  Surgeon: Katha Cabal, MD;  Location: [facility];  Service: Vascular;  Laterality: Left;  . INSERTION OF DIALYSIS CATHETER  05/30/2017  . INSERTION OF DIALYSIS CATHETER N/A 05/30/2017   Procedure: INSERTION OF DIALYSIS CATHETER;  Surgeon: Elam Dutch, MD;  Location: [place];  Service: Vascular;  Laterality: N/A;  . IR PARACENTESIS  08/30/2017  . IR PARACENTESIS  09/29/2017  . LEFT HEART CATH AND CORONARY ANGIOGRAPHY N/A 02/22/2017   Procedure: LEFT HEART CATH AND CORONARY ANGIOGRAPHY;  Surgeon: Nigel Mormon, MD;  Location: [place] [facility];  Service: Cardiovascular;   Laterality: N/A;  . LIGATION OF ARTERIOVENOUS  FISTULA Left 11/17/5636   Procedure: Plication of Left Arm Arteriovenous Fistula;  Surgeon: Elam Dutch, MD;  Location: [place];  Service: Vascular;  Laterality: Left;  . POLYPECTOMY    . REVISON OF ARTERIOVENOUS FISTULA Left 7/56/4332   Procedure: PLICATION OF DISTAL ANEURYSMAL SEGEMENT OF LEFT UPPER ARM ARTERIOVENOUS FISTULA;  Surgeon: Elam Dutch, MD;  Location: [place];  Service: Vascular;  Laterality: Left;  . REVISON OF ARTERIOVENOUS FISTULA Left 9/51/8841   Procedure: Plication of Left Upper Arm Fistula ;  Surgeon: Waynetta Sandy, MD;  Location: [place];  Service: Vascular;  Laterality: Left;  . SKIN GRAFT SPLIT THICKNESS LEG / FOOT Left    SKIN GRAFT SPLIT THICKNESS LEFT ARM DONOR SITE: LEFT ANTERIOR THIGH  . SKIN SPLIT GRAFT Left 07/04/2017   Procedure: SKIN GRAFT SPLIT THICKNESS LEFT ARM DONOR SITE: LEFT ANTERIOR THIGH;  Surgeon: Elam Dutch, MD;  Location: [place];  Service: Vascular;  Laterality: Left;  . THROMBECTOMY W/ EMBOLECTOMY Left 06/05/2017   Procedure: EXPLORATION OF LEFT ARM FOR BLEEDING; OVERSEWED PROXIMAL FISTULA;  Surgeon: Angelia Mould, MD;  Location: [place];  Service: Vascular;  Laterality: Left;  . WOUND EXPLORATION Left 06/03/2017   Procedure: WOUND EXPLORATION WITH WOUND VAC APPLICATION TO LEFT ARM;  Surgeon: Angelia Mould, MD;  Location: [facility];  Service: Vascular;  Laterality: Left;   Family History  Problem Relation Age of Onset  . Heart disease Mother   . Lung cancer Mother   . Heart disease Father   . Malignant hyperthermia Father   . COPD Father   . Throat cancer Sister   . Esophageal cancer Sister   . Hypertension Other   . COPD Other   . Colon cancer Neg Hx   . Colon polyps Neg Hx   . Rectal cancer Neg Hx   . Stomach cancer Neg Hx    Social History:  reports that he has been smoking cigarettes.  He started smoking about 44 years ago. He has a 21.50 pack-year smoking  history. He has never used smokeless tobacco. He reports that he drinks alcohol. He reports that he has current or past drug history. Drugs: Marijuana and Cocaine. Allergies  Allergen Reactions  . Aspirin Other (See Comments)    STOMACH PAIN  . Tylenol [Acetaminophen]     Stomach ache  . Clonidine Derivatives Itching  . Tramadol Itching   Prior to Admission medications   Medication Sig Start Date End Date Taking? Authorizing Provider  albuterol (VENTOLIN HFA) 108 (90 Base) MCG/ACT inhaler Inhale 2 puffs into the lungs every 6 (six) hours as needed for wheezing or shortness of breath.   Yes [provider]  budesonide-formoterol (SYMBICORT) 80-4.5 MCG/ACT inhaler Inhale 2 puffs into the lungs 2 (two) times daily. 10/13/17  Yes Tanda Rockers, MD  diltiazem (CARDIZEM CD) 120 MG 24 hr capsule Take 1  capsule (120 mg total) by mouth daily. 06/23/17 06/23/18 Yes Shahmehdi, Valeria Batman, MD  famotidine (PEPCID) 20 MG tablet One at bedtime Patient taking differently: Take 20 mg by mouth at bedtime.  09/08/17  Yes Tanda Rockers, MD  Fluticasone-Salmeterol (ADVAIR) 250-50 MCG/DOSE AEPB Inhale 1 puff into the lungs 2 (two) times daily.   Yes [provider]  gabapentin (NEURONTIN) 100 MG capsule Take 1 capsule (100 mg total) by mouth at bedtime. Patient taking differently: Take 100 mg by mouth 2 (two) times daily.  09/30/17  Yes Mariel Aloe, MD  hydrALAZINE (APRESOLINE) 100 MG tablet Take 1 tablet by mouth twice a day take after HD on dialysis days 08/31/17  Yes [provider]  oxyCODONE (OXY IR/ROXICODONE) 5 MG immediate release tablet Take 5 mg by mouth 2 (two) times daily as needed for moderate pain.  09/22/17  Yes [provider]  clopidogrel (PLAVIX) 75 MG tablet Take 1 tablet (75 mg total) by mouth daily. 07/15/17   Rhyne, Hulen Shouts, PA-C  isosorbide mononitrate (IMDUR) 30 MG 24 hr tablet Take 1 tablet (30 mg total) by mouth daily. 06/17/17   [name], MD  nicotine  (NICODERM CQ - DOSED IN MG/24 HOURS) 21 mg/24hr patch Place 21 mg onto the skin daily.    [provider]  Nutritional Supplements (FEEDING SUPPLEMENT, NEPRO CARB STEADY,) LIQD Take 237 mLs by mouth 2 (two) times daily between meals. 09/30/17   Mariel Aloe, MD  pantoprazole (PROTONIX) 40 MG tablet Take 1 tablet (40 mg total) by mouth daily. Take 30-60 min before first meal of the day 09/08/17   Tanda Rockers, MD  SPIRIVA HANDIHALER 18 MCG inhalation capsule Place 1 puff into inhaler and inhale daily. 10/04/17   [provider]   Current Facility-Administered Medications  Medication Dose Route Frequency Provider Last Rate Last Dose  . 0.9 %  sodium chloride infusion  250 mL Intravenous PRN Black, Karen M, NP      . albuterol (PROVENTIL) (2.5 MG/3ML) 0.083% nebulizer solution 2.5 mg  2.5 mg Nebulization Q6H PRN Rama, Venetia Maxon, MD      . cefTRIAXone (ROCEPHIN) 2 g in sodium chloride 0.9 % 100 mL IVPB  2 g Intravenous Q24H Radene Gunning, NP   Stopped at 10/28/17 0746  . diltiazem (CARDIZEM CD) 24 hr capsule 120 mg  120 mg Oral Daily Black, Karen M, NP      . feeding supplement (NEPRO CARB STEADY) liquid 237 mL  237 mL Oral BID BM Black, Karen M, NP      . gabapentin (NEURONTIN) capsule 100 mg  100 mg Oral BID Black, Karen M, NP      . hydrALAZINE (APRESOLINE) tablet 100 mg  100 mg Oral Q8H Black, Lezlie Octave, NP      . HYDROmorphone (DILAUDID) injection 0.5 mg  0.5 mg Intravenous Q4H PRN Radene Gunning, NP   0.5 mg at 10/28/17 1105  . isosorbide mononitrate (IMDUR) 24 hr tablet 30 mg  30 mg Oral Daily Black, Karen M, NP      . lactulose (CHRONULAC) 10 GM/15ML solution 20 g  20 g Oral Once Black, Karen M, NP      . lidocaine (XYLOCAINE) 2 % (with pres) injection 400 mg  20 mL Infiltration Once Black, Karen M, NP      . lidocaine (XYLOCAINE) 2 % injection           . lidocaine (XYLOCAINE) 2 % injection  PRN Docia Barrier, PA   10 mL at 10/28/17 1127  . metoprolol  tartrate (LOPRESSOR) injection 5 mg  5 mg Intravenous Once Black, Karen M, NP      . ondansetron [facility]) tablet 4 mg  4 mg Oral Q6H PRN Radene Gunning, NP       Or  . ondansetron [facility]) injection 4 mg  4 mg Intravenous Q6H PRN Black, Lezlie Octave, NP      . sevelamer carbonate (RENVELA) tablet 800 mg  800 mg Oral TID WC Black, Lezlie Octave, NP      . sodium chloride flush (NS) 0.9 % injection 3 mL  3 mL Intravenous Q12H Radene Gunning, NP   3 mL at 10/28/17 0956  . sodium chloride flush (NS) 0.9 % injection 3 mL  3 mL Intravenous PRN Black, Karen M, NP      . tiotropium [facility]) inhalation capsule 18 mcg  18 mcg Inhalation Daily Black, Lezlie Octave, NP       Current Outpatient Medications  Medication Sig Dispense Refill  . albuterol (VENTOLIN HFA) 108 (90 Base) MCG/ACT inhaler Inhale 2 puffs into the lungs every 6 (six) hours as needed for wheezing or shortness of breath.    . budesonide-formoterol (SYMBICORT) 80-4.5 MCG/ACT inhaler Inhale 2 puffs into the lungs 2 (two) times daily. 1 Inhaler 1  . diltiazem (CARDIZEM CD) 120 MG 24 hr capsule Take 1 capsule (120 mg total) by mouth daily. 30 capsule 1  . famotidine (PEPCID) 20 MG tablet One at bedtime (Patient taking differently: Take 20 mg by mouth at bedtime. ) 30 tablet 2  . Fluticasone-Salmeterol (ADVAIR) 250-50 MCG/DOSE AEPB Inhale 1 puff into the lungs 2 (two) times daily.    Marland Kitchen gabapentin (NEURONTIN) 100 MG capsule Take 1 capsule (100 mg total) by mouth at bedtime. (Patient taking differently: Take 100 mg by mouth 2 (two) times daily. )    . hydrALAZINE (APRESOLINE) 100 MG tablet Take 1 tablet by mouth twice a day take after HD on dialysis days  11  . oxyCODONE (OXY IR/ROXICODONE) 5 MG immediate release tablet Take 5 mg by mouth 2 (two) times daily as needed for moderate pain.   0  . clopidogrel (PLAVIX) 75 MG tablet Take 1 tablet (75 mg total) by mouth daily. 30 tablet 3  . isosorbide mononitrate (IMDUR) 30 MG 24 hr tablet Take 1 tablet (30 mg total)  by mouth daily. 30 tablet 1  . nicotine (NICODERM CQ - DOSED IN MG/24 HOURS) 21 mg/24hr patch Place 21 mg onto the skin daily.    . Nutritional Supplements (FEEDING SUPPLEMENT, NEPRO CARB STEADY,) LIQD Take 237 mLs by mouth 2 (two) times daily between meals. 60 Can 0  . pantoprazole (PROTONIX) 40 MG tablet Take 1 tablet (40 mg total) by mouth daily. Take 30-60 min before first meal of the day 30 tablet 2  . SPIRIVA HANDIHALER 18 MCG inhalation capsule Place 1 puff into inhaler and inhale daily.  12    ROS: As per HPI otherwise negative.  Physical Exam: Vitals:   10/28/17 0645 10/28/17 0742 10/28/17 0815 10/28/17 1124  BP: 129/89 (!) 139/107 113/79 (!) 161/77  Pulse: (!) 104 (!) 102 (!) 103   Resp: (!) 25 (!) 21 15   Temp:      TempSrc:      SpO2: 94% 96% (!) 89%      General: WDWN NAD Head: NCAT sclera not icteric MMM Neck: Supple. No JVD No masses  Lungs: CTA bilaterally without wheezes, rales, or rhonchi. Breathing is unlabored. Heart: RRR with S1 S2 Abdomen: soft distended generalized tenderness  Lower extremities:without edema or ischemic changes, no open wounds  Neuro: A & O  X 3. Moves all extremities spontaneously. Psych:  Responds to questions appropriately with a normal affect. Dialysis Access: [facility] R chest; RUE AVF+bruit   Labs: Basic Metabolic Panel: Recent Labs  Lab 10/25/17 0754 10/27/17 1708  NA 139 132*  K 3.0* 5.0  CL 116* 91*  CO2 16* 28  GLUCOSE 44* 129*  BUN 17 35*  CREATININE 2.65* 6.78*  CALCIUM 5.0* 9.0   Liver Function Tests: Recent Labs  Lab 10/25/17 0754 10/27/17 1708  AST 19 25  ALT 16* 19  ALKPHOS 98 170*  BILITOT 0.4 0.8  PROT 3.5* 6.6  ALBUMIN 1.7* 3.1*   Recent Labs  Lab 10/25/17 0754 10/27/17 1708  LIPASE 20 28   No results for input(s): AMMONIA in the last 168 hours. CBC: Recent Labs  Lab 10/25/17 0754 10/27/17 1708  WBC 8.8 9.7  HGB 9.8* 9.6*  HCT 28.8* 28.6*  MCV 76.4* 76.1*  PLT 218 230   Cardiac Enzymes: No  results for input(s): CKTOTAL, CKMB, CKMBINDEX, TROPONINI in the last 168 hours. CBG: Recent Labs  Lab 10/25/17 0927  GLUCAP 155*   Iron Studies: No results for input(s): IRON, TIBC, TRANSFERRIN, FERRITIN in the last 72 hours. Studies/Results: Dg Chest 2 View  Result Date: 10/27/2017 CLINICAL DATA:  Sharp abdominal pain for 3 days.  Dyspnea. EXAM: CHEST - 2 VIEW COMPARISON:  10/03/2017 FINDINGS: Stable cardiomegaly with aortic atherosclerosis. Dialysis catheter tip from right IJ approach terminates in the mid right atrium. Interstitial edema is noted with left basilar atelectasis and chronic elevation of the left hemidiaphragm. No acute nor suspicious osseous abnormality. IMPRESSION: 1. Stable cardiomegaly with mild interstitial pulmonary edema, slightly worse than on comparison. No effusion or pneumothorax. 2. Aortic atherosclerosis without aneurysm. Electronically Signed   By: Ashley Royalty M.D.   On: 10/27/2017 18:45    Dialysis Orders:  [facility] MWF 180NRe 400/800 EDW 70kg 2K/2Ca TDC No Hep bolus, R AVF placed 08/09/17 Venofer 100mg  IV x10 until 4/24 Mircera 228mcg IV q 2 weeks (last 4/17) Parsabiv 5mg  IV TIW  BMM Renvela 3 qac 2 q snacks  Assessment/Plan: 1.  Abd pain/ascites - 2/2 Hep B/C cirrhosis. For IR paracentesis today  2. Dyspnea - Mild edema on CXR - Should improve with paracentesis/dialysis today  3.  ESRD -  MWF. For HD today  4.  Hypertension/volume  - BP elevated likely d/t volume status 5.  Anemia  - Hgb 9.6. Cont IV Fe here/ESA recently dosed 6.  Metabolic bone disease -  Continue VDRA/binders. Parsabiv not on formulary  7.  Nutrition - Renal diet/vitamins/Prostat for low albumin   Lynnda Child PA-C [place] Pager [phone] 10/28/2017, 11:52 AM   Pt seen, examined and agree w A/P as above.  [name] MD [employer] pager [phone]   10/28/2017, 2:41 PM

## 2017-10-28 NOTE — ED Notes (Signed)
Patient back from IR. All cabling and wires off of patient upon return. Patient placed back on monitor.

## 2017-10-28 NOTE — Progress Notes (Signed)
Patient refusing telemetry at this time. Patient oriented to room.

## 2017-10-28 NOTE — ED Notes (Signed)
Patient transported to IR for procedure

## 2017-10-29 ENCOUNTER — Inpatient Hospital Stay (HOSPITAL_COMMUNITY): Payer: Medicare Other

## 2017-10-29 ENCOUNTER — Other Ambulatory Visit: Payer: Self-pay

## 2017-10-29 DIAGNOSIS — R188 Other ascites: Secondary | ICD-10-CM

## 2017-10-29 DIAGNOSIS — R0602 Shortness of breath: Secondary | ICD-10-CM

## 2017-10-29 DIAGNOSIS — B181 Chronic viral hepatitis B without delta-agent: Secondary | ICD-10-CM

## 2017-10-29 DIAGNOSIS — N186 End stage renal disease: Secondary | ICD-10-CM

## 2017-10-29 DIAGNOSIS — K7031 Alcoholic cirrhosis of liver with ascites: Principal | ICD-10-CM

## 2017-10-29 DIAGNOSIS — I10 Essential (primary) hypertension: Secondary | ICD-10-CM

## 2017-10-29 DIAGNOSIS — J42 Unspecified chronic bronchitis: Secondary | ICD-10-CM

## 2017-10-29 MED ORDER — DOCUSATE SODIUM 283 MG RE ENEM
1.0000 | ENEMA | RECTAL | Status: DC | PRN
Start: 2017-10-29 — End: 2017-10-30
  Filled 2017-10-29: qty 1

## 2017-10-29 MED ORDER — IPRATROPIUM-ALBUTEROL 0.5-2.5 (3) MG/3ML IN SOLN
3.0000 mL | Freq: Two times a day (BID) | RESPIRATORY_TRACT | Status: DC
  Administered 2017-10-30: 3 mL via RESPIRATORY_TRACT
  Filled 2017-10-29: qty 3

## 2017-10-29 MED ORDER — NEPRO/CARBSTEADY PO LIQD
237.0000 mL | Freq: Three times a day (TID) | ORAL | Status: DC | PRN
  Filled 2017-10-29: qty 237

## 2017-10-29 MED ORDER — METHYLPREDNISOLONE SODIUM SUCC 125 MG IJ SOLR
60.0000 mg | Freq: Two times a day (BID) | INTRAMUSCULAR | Status: DC
  Administered 2017-10-29 (×2): 60 mg via INTRAVENOUS
  Filled 2017-10-29 (×2): qty 2

## 2017-10-29 MED ORDER — LORATADINE 10 MG PO TABS
10.0000 mg | ORAL_TABLET | Freq: Every day | ORAL | Status: DC
  Administered 2017-10-29 – 2017-10-30 (×2): 10 mg via ORAL
  Filled 2017-10-29 (×2): qty 1

## 2017-10-29 MED ORDER — GUAIFENESIN ER 600 MG PO TB12
1200.0000 mg | ORAL_TABLET | Freq: Two times a day (BID) | ORAL | Status: DC
  Filled 2017-10-29: qty 2

## 2017-10-29 MED ORDER — ONDANSETRON HCL 4 MG/2ML IJ SOLN: 4.0000 mg | Freq: Four times a day (QID) | INTRAMUSCULAR | Status: DC | PRN

## 2017-10-29 MED ORDER — CAMPHOR-MENTHOL 0.5-0.5 % EX LOTN
1.0000 "application " | TOPICAL_LOTION | Freq: Three times a day (TID) | CUTANEOUS | Status: DC | PRN
  Filled 2017-10-29: qty 222

## 2017-10-29 MED ORDER — HYDROXYZINE HCL 25 MG PO TABS: 25.0000 mg | ORAL_TABLET | Freq: Three times a day (TID) | ORAL | Status: DC | PRN

## 2017-10-29 MED ORDER — ZOLPIDEM TARTRATE 5 MG PO TABS
5.0000 mg | ORAL_TABLET | Freq: Every day | ORAL | Status: DC
Start: 2017-10-29 — End: 2017-10-30
  Administered 2017-10-29: 5 mg via ORAL
  Filled 2017-10-29: qty 1

## 2017-10-29 MED ORDER — ACETAMINOPHEN 325 MG PO TABS: 650.0000 mg | ORAL_TABLET | Freq: Four times a day (QID) | ORAL | Status: DC | PRN

## 2017-10-29 MED ORDER — SORBITOL 70 % SOLN: 30.0000 mL | Status: DC | PRN

## 2017-10-29 MED ORDER — LORAZEPAM 0.5 MG PO TABS
0.5000 mg | ORAL_TABLET | Freq: Two times a day (BID) | ORAL | Status: DC | PRN
  Administered 2017-10-29: 0.5 mg via ORAL
  Filled 2017-10-29: qty 1

## 2017-10-29 MED ORDER — CALCIUM CARBONATE ANTACID 1250 MG/5ML PO SUSP
500.0000 mg | Freq: Four times a day (QID) | ORAL | Status: DC | PRN
  Filled 2017-10-29: qty 5

## 2017-10-29 MED ORDER — ONDANSETRON HCL 4 MG PO TABS: 4.0000 mg | ORAL_TABLET | Freq: Four times a day (QID) | ORAL | Status: DC | PRN

## 2017-10-29 MED ORDER — LACTULOSE 10 GM/15ML PO SOLN
20.0000 g | Freq: Every day | ORAL | Status: DC
  Administered 2017-10-30: 20 g via ORAL
  Filled 2017-10-29: qty 30

## 2017-10-29 MED ORDER — BUDESONIDE 0.5 MG/2ML IN SUSP
0.5000 mg | Freq: Two times a day (BID) | RESPIRATORY_TRACT | Status: DC
  Administered 2017-10-29 – 2017-10-30 (×3): 0.5 mg via RESPIRATORY_TRACT
  Filled 2017-10-29 (×3): qty 2

## 2017-10-29 MED ORDER — FLUTICASONE PROPIONATE 50 MCG/ACT NA SUSP
2.0000 | Freq: Every day | NASAL | Status: DC
  Administered 2017-10-30: 2 via NASAL
  Filled 2017-10-29: qty 16

## 2017-10-29 MED ORDER — GUAIFENESIN 100 MG/5ML PO SOLN
5.0000 mL | ORAL | Status: DC | PRN
  Administered 2017-10-29 – 2017-10-30 (×3): 100 mg via ORAL
  Filled 2017-10-29 (×3): qty 5

## 2017-10-29 MED ORDER — ACETAMINOPHEN 650 MG RE SUPP: 650.0000 mg | Freq: Four times a day (QID) | RECTAL | Status: DC | PRN

## 2017-10-29 MED ORDER — IPRATROPIUM-ALBUTEROL 0.5-2.5 (3) MG/3ML IN SOLN
3.0000 mL | Freq: Four times a day (QID) | RESPIRATORY_TRACT | Status: DC
  Administered 2017-10-29 (×2): 3 mL via RESPIRATORY_TRACT
  Filled 2017-10-29 (×2): qty 3

## 2017-10-29 MED ORDER — SEVELAMER CARBONATE 800 MG PO TABS
2400.0000 mg | ORAL_TABLET | Freq: Three times a day (TID) | ORAL | Status: DC
  Administered 2017-10-29 – 2017-10-30 (×3): 2400 mg via ORAL
  Filled 2017-10-29 (×3): qty 3

## 2017-10-29 NOTE — Progress Notes (Signed)
[facility] Progress Note  Subjective: asking for something for anxiety and to help him sleep.  Still SOB, despite 3L off on HD last night.  No sig prod cough or fevers.   Vitals:   10/29/17 0257 10/29/17 0541 10/29/17 0744 10/29/17 0838  BP: 106/62 107/68  110/81  Pulse: 98 97  (!) 108  Resp: 18 18  18   Temp: 98.7 F (37.1 C) 98.9 F (37.2 C)  98.1 F (36.7 C)  TempSrc: Oral Oral  Oral  SpO2: 90% 91%  96%  Weight: 68 kg (149 lb 14.6 oz)     Height:   5\' 9"  (1.753 m)     Inpatient medications: . budesonide (PULMICORT) nebulizer solution  0.5 mg Nebulization BID  . diltiazem  120 mg Oral Daily  . feeding supplement (NEPRO CARB STEADY)  237 mL Oral BID BM  . fluticasone  2 spray Each Nare Daily  . gabapentin  100 mg Oral BID  . hydrALAZINE  100 mg Oral Q8H  . ipratropium-albuterol  3 mL Nebulization Q6H  . isosorbide mononitrate  30 mg Oral Daily  . lactulose  20 g Oral Once  . lidocaine  20 mL Infiltration Once  . loratadine  10 mg Oral Daily  . methylPREDNISolone (SOLU-MEDROL) injection  60 mg Intravenous Q12H  . multivitamin  1 tablet Oral QHS  . sevelamer carbonate  800 mg Oral TID WC  . sodium chloride flush  3 mL Intravenous Q12H  . zolpidem  5 mg Oral QHS   . sodium chloride    . cefTRIAXone (ROCEPHIN)  IV 2 g (10/29/17 0553)  . ferric gluconate (FERRLECIT/NULECIT) IV Stopped (10/28/17 1437)   sodium chloride, acetaminophen **OR** acetaminophen, albuterol, calcium carbonate (dosed in mg elemental calcium), camphor-menthol **AND** hydrOXYzine, docusate sodium, feeding supplement (NEPRO CARB STEADY), HYDROmorphone (DILAUDID) injection, HYDROmorphone (DILAUDID) injection, ondansetron **OR** ondansetron (ZOFRAN) IV, sodium chloride flush, sorbitol  Exam: Alert, appears mild-mod dyspneic No jvd Chest diffusely decreased air movement, no discrete rales or wheezing Cor RRR Abd soft, distended, ascites 2-3+ Ext cachectic , muscle wasting, no LE edema NF,  Ox 3, no asterixis  Dialysis: [place] MWF 70kg   2/2 bath  TDC/ R AVF not using yet (placed 08/09/17)   Heparin none   Venofer 100mg  IV x10 until 4/24 Mircera 230mcg IV q 2 weeks (last 4/17) Parsabiv 5mg  IV TIW  BMM Renvela 3 qac 2 q snacks       Impression: 1  Abd pain/ ascites - for paracentesis today 2  Cirrhosis/ hepB 3  ESRD - HD MWF. Next HD Monday.  4  Dyspnea - not moving air well, consider COPD exacerbation, have d/w pmd; was wet on admit CXR, had HD yest w/ 3L off and is below edw, will repeat CXR 5  Cachexia - prob due to multiorgan failure (ESRD, COPD, cirrhosis); poor prognosis 6  Anxiety - pt requesting meds for sleep, not sleeping due to "anxiety", have d/w pmd 7  Anemia ckd - Hb 9.6, cont IV Fe, esa not due until approx 5/2  Plan -  As above   [name] MD [place] pager [phone]   10/29/2017, 10:28 AM   Recent Labs  Lab 10/25/17 0754 10/27/17 1708  NA 139 132*  K 3.0* 5.0  CL 116* 91*  CO2 16* 28  GLUCOSE 44* 129*  BUN 17 35*  CREATININE 2.65* 6.78*  CALCIUM 5.0* 9.0   Recent Labs  Lab 10/25/17 0754 10/27/17 1708  AST  19 25  ALT 16* 19  ALKPHOS 98 170*  BILITOT 0.4 0.8  PROT 3.5* 6.6  ALBUMIN 1.7* 3.1*   Recent Labs  Lab 10/25/17 0754 10/27/17 1708  WBC 8.8 9.7  HGB 9.8* 9.6*  HCT 28.8* 28.6*  MCV 76.4* 76.1*  PLT 218 230   Iron/TIBC/Ferritin/ %Sat    Component Value Date/Time   IRON 104 09/03/2017 0800   TIBC 343 09/03/2017 0800   FERRITIN 513 (H) 09/03/2017 0800   IRONPCTSAT 30 09/03/2017 0800

## 2017-10-29 NOTE — Progress Notes (Signed)
Pt refusing labs and EKG today. Will try again later. Dr. Grandville Silos made aware. Dorthey Sawyer, RN

## 2017-10-29 NOTE — Progress Notes (Signed)
HD tx completed @ 8250 w/o problem, UF goal met, blood rinsed back, VSS, pt more pleasant at end of tx and aplogized for how he was when he first came in, report called to Percell Boston, RN

## 2017-10-29 NOTE — Progress Notes (Signed)
PROGRESS NOTE    [name]  OAC:[id number] DOB: [birth year] DOA: 10/27/2017 PCP: [name], MD   Brief Narrative:  [name] is a 57 y.o. male with medical history significant for end-stage renal disease on hemodialysis Monday Wednesday Friday, cirrhosis secondary to hepatitis C, hepatitis B and alcohol abuse, polysubstance abuse with history of suicide ideation, atrial flutter with RVR, COPD, presented to the emergency department with a chief complaint of abdominal pain. Of note this is his 12th admission over the last 6 months. Patient had a CT of the abdomen pelvis 2 days ago revealing a large volume ascites he was referred to the ED for paracentesis. [facility] were asked to admit.  Information was obtained from the chart and the patient.he was seen in the ER 3 days ago. At that time he had a CT scan that showed large volume ascites. He reports since that time his abdomen has just continued to become more distended and abdominal pain worsening. He reports the pain is constant describes it as a pressure/ache located throughout his abdomen. He also reported a tendency towards constipation and has not had a bowel movement in 2 days. He denies any fever or headache nausea vomiting. He reports intermittent nausea without vomiting and a decreased oral intake. He denies chest pain palpitation but does endorse some worsening shortness of breath. He says he goes to dialysis Monday Wednesday and Friday and his last dialysis session was 2 days ago.   ED Course: in the emergency department is afebrile hemodynamically stable with mild tachycardias oxygen saturation levels greater then 90% on room air.     Assessment & Plan:   Principal Problem:   Ascites Active Problems:   Cigarette smoker   Chronic hepatitis B (HCC)   Essential hypertension   COPD (chronic obstructive pulmonary disease) (HCC)   Adjustment disorder with mixed anxiety and depressed mood  CKD (chronic kidney disease) stage V requiring chronic dialysis (HCC)   Anemia due to chronic kidney disease   Abdominal pain  1 abdominal pain in the setting of cirrhosis/ascites CT abdomen and pelvis done 10/25/2017 with large volume ascites noted.  Presented with abdominal pain.  Patient with CT scan concerning for cirrhosis.  Patient noted to have paracentesis done in March and one done last month urine 1.6 L.  Patient given a dose of IV Rocephin in the ED and currently on IV daily Rocephin.  INR 1.2.  Patient status post diagnostic and therapeutic paracentesis with 5 mL of fluid removed labs pending.  Cultures from paracentesis with no growth to date.  Continue empiric IV Rocephin until cultures are finalized.  Will need outpatient follow-up with GI.  Follow.  2.  Cirrhosis secondary to hepatitis B/hepatitis C/alcoholism Patient with a history of noncompliance.  Continues to drink.  Will need outpatient follow-up with PCP and GI.  Follow for now.  3.  End-stage renal disease Patient on hemodialysis Monday Wednesday Friday.  Patient has been seen by nephrology and patient for next HD on Monday.  4.  Shortness of breath/dyspnea/??  Mild COPD exacerbation Patient with poor to fair air movement.  Patient with history of COPD.  Concern for possible mild COPD exacerbation.  Repeat chest x-ray pending.  Place on Solu-Medrol 60 mg IV every 12 hours.  Place on Pulmicort, scheduled duo nebs, Claritin, Flonase.  Follow.  5.  Hypertension Stable.  Continue current regimen of hydralazine,imdur, Cardizem CD.  6.  Anxiety Place on Ativan 0.5 mg twice daily as needed.  Will need outpatient follow-up with PCP.  7.  ESRD on HD Per nephrology.  8.  Polysubstance abuse Social work consulted.  9.  Moderate mitral stenosis Outpatient follow-up.   DVT prophylaxis: SCD Code Status: Full. Family Communication: Updated patient.  No family at bedside. Disposition Plan: Home when clinically improved and  per nephrology.   Consultants:   Nephrology:Dr Schertz 10/28/2017  Procedures:   CT abdomen and pelvis 10/25/2017  Chest x-ray 10/27/2017, 10/29/2017  Ultrasound-guided diagnostic and therapeutic paracentesis with 5 mL of peritoneal fluid removed 10/28/2017--Kacie Zigmund , PA  Antimicrobials:   IV Rocephin 10/28/2017   Subjective: Sleeping however easily arousable.  States improving shortness of breath however not at baseline..  Denies any chest pain.  Complaining about anxiety and inability to sleep.  Objective: Vitals:   10/29/17 0257 10/29/17 0541 10/29/17 0744 10/29/17 0838  BP: 106/62 107/68  110/81  Pulse: 98 97  (!) 108  Resp: 18 18  18   Temp: 98.7 F (37.1 C) 98.9 F (37.2 C)  98.1 F (36.7 C)  TempSrc: Oral Oral  Oral  SpO2: 90% 91%  96%  Weight: 68 kg (149 lb 14.6 oz)     Height:   5\' 9"  (1.753 m)     Intake/Output Summary (Last 24 hours) at 10/29/2017 1332 Last data filed at 10/29/2017 0926 Gross per 24 hour  Intake 660 ml  Output 3003 ml  Net -2343 ml   Filed Weights   10/28/17 2105 10/28/17 2240 10/29/17 0257  Weight: 70.9 kg (156 lb 4.9 oz) 71 kg (156 lb 8.4 oz) 68 kg (149 lb 14.6 oz)    Examination:  General exam: Appears calm and comfortable  Respiratory system: Clear to auscultation.  No wheezes, no crackles, no rhonchi.  Poor to fair air movement.  Cardiovascular system: S1 & S2 heard, RRR. No JVD, murmurs, rubs, gallops or clicks. No pedal edema. Gastrointestinal system: Abdomen is distended, soft, some tenderness to palpation, positive bowel sounds.  Central nervous system: Alert and oriented. No focal neurological deficits. Extremities: Symmetric 5 x 5 power. Skin: No rashes, lesions or ulcers Psychiatry: Judgement and insight appear normal. Mood & affect appropriate.     Data Reviewed: I have personally reviewed following labs and imaging studies  CBC: Recent Labs  Lab 10/25/17 0754 10/27/17 1708  WBC 8.8 9.7  HGB 9.8* 9.6*  HCT  28.8* 28.6*  MCV 76.4* 76.1*  PLT 218 161   Basic Metabolic Panel: Recent Labs  Lab 10/25/17 0754 10/27/17 1708  NA 139 132*  K 3.0* 5.0  CL 116* 91*  CO2 16* 28  GLUCOSE 44* 129*  BUN 17 35*  CREATININE 2.65* 6.78*  CALCIUM 5.0* 9.0   GFR: Estimated Creatinine Clearance: 11.8 mL/min (A) (by C-G formula based on SCr of 6.78 mg/dL (H)). Liver Function Tests: Recent Labs  Lab 10/25/17 0754 10/27/17 1708  AST 19 25  ALT 16* 19  ALKPHOS 98 170*  BILITOT 0.4 0.8  PROT 3.5* 6.6  ALBUMIN 1.7* 3.1*   Recent Labs  Lab 10/25/17 0754 10/27/17 1708  LIPASE 20 28   No results for input(s): AMMONIA in the last 168 hours. Coagulation Profile: Recent Labs  Lab 10/28/17 0215  INR 1.25   Cardiac Enzymes: No results for input(s): CKTOTAL, CKMB, CKMBINDEX, TROPONINI in the last 168 hours. BNP (last 3 results) No results for input(s): PROBNP in the last 8760 hours. HbA1C: No results for input(s): HGBA1C in the last 72 hours. CBG: Recent Labs  Lab 10/25/17  [place]   Lipid Profile: No results for input(s): CHOL, HDL, LDLCALC, TRIG, CHOLHDL, LDLDIRECT in the last 72 hours. Thyroid Function Tests: No results for input(s): TSH, T4TOTAL, FREET4, T3FREE, THYROIDAB in the last 72 hours. Anemia Panel: No results for input(s): VITAMINB12, FOLATE, FERRITIN, TIBC, IRON, RETICCTPCT in the last 72 hours. Sepsis Labs: No results for input(s): PROCALCITON, LATICACIDVEN in the last 168 hours.  Recent Results (from the past 240 hour(s))  Body fluid culture     Status: None (Preliminary result)   Collection Time: 10/28/17 11:45 AM  Result Value Ref Range Status   Specimen Description FLUID PERITONEAL CAVITY  Final   Special Requests NONE  Final   Gram Stain   Final    FEW WBC PRESENT,BOTH PMN AND MONONUCLEAR NO ORGANISMS SEEN    Culture   Final    NO GROWTH < 24 HOURS Performed at [facility], [address], [place], [place] [address]    Report Status PENDING   Incomplete         Radiology Studies: Dg Chest 2 View  Result Date: 10/27/2017 CLINICAL DATA:  Sharp abdominal pain for 3 days.  Dyspnea. EXAM: CHEST - 2 VIEW COMPARISON:  10/03/2017 FINDINGS: Stable cardiomegaly with aortic atherosclerosis. Dialysis catheter tip from right IJ approach terminates in the mid right atrium. Interstitial edema is noted with left basilar atelectasis and chronic elevation of the left hemidiaphragm. No acute nor suspicious osseous abnormality. IMPRESSION: 1. Stable cardiomegaly with mild interstitial pulmonary edema, slightly worse than on comparison. No effusion or pneumothorax. 2. Aortic atherosclerosis without aneurysm. Electronically Signed   By: Ashley Royalty M.D.   On: 10/27/2017 18:45   Dg Chest Port 1 View  Result Date: 10/29/2017 CLINICAL DATA:  Chest pain and shortness of breath for 1 day. EXAM: PORTABLE CHEST 1 VIEW COMPARISON:  10/27/2017. FINDINGS: Stable enlarged cardiac silhouette. The right jugular catheter tip is in the right atrium. The left hemidiaphragm remains elevated. Clear lungs with normal vascularity. IMPRESSION: 1. The right jugular catheter tip remains in the mid right atrium. 2. Stable cardiomegaly and elevation of the left hemidiaphragm. Electronically Signed   By: Claudie Revering M.D.   On: 10/29/2017 11:31   Ir Paracentesis  Result Date: 10/28/2017 INDICATION: Patient with recurrent ascites. Request is made for diagnostic and therapeutic paracentesis. EXAM: ULTRASOUND GUIDED DIAGNOSTIC AND THERAPEUTIC PARACENTESIS MEDICATIONS: 10 mL 2% lidocaine COMPLICATIONS: None immediate. PROCEDURE: Informed written consent was obtained from the patient after a discussion of the risks, benefits and alternatives to treatment. A timeout was performed prior to the initiation of the procedure. Initial ultrasound scanning demonstrates a small amount of ascites within the left lateral abdomen. The left lateral abdomen was prepped and draped in the usual sterile  fashion. 2% lidocaine was used for local anesthesia. Following this, a 19 gauge, 7-cm, Yueh catheter was introduced. An ultrasound image was saved for documentation purposes. The paracentesis was performed. The catheter was removed and a dressing was applied. The patient tolerated the procedure well without immediate post procedural complication. FINDINGS: A total of approximately 5 mL of amber fluid was removed. Samples were sent to the laboratory as requested by the clinical team. IMPRESSION: Successful ultrasound-guided diagnostic and therapeutic paracentesis yielding 5 mL liters of peritoneal fluid. Read by: Brynda Greathouse PA-C Electronically Signed   By: Sandi Mariscal M.D.   On: 10/28/2017 12:07        Scheduled Meds: . budesonide (PULMICORT) nebulizer solution  0.5 mg Nebulization  BID  . diltiazem  120 mg Oral Daily  . feeding supplement (NEPRO CARB STEADY)  237 mL Oral BID BM  . fluticasone  2 spray Each Nare Daily  . gabapentin  100 mg Oral BID  . hydrALAZINE  100 mg Oral Q8H  . ipratropium-albuterol  3 mL Nebulization Q6H  . isosorbide mononitrate  30 mg Oral Daily  . lactulose  20 g Oral Once  . lidocaine  20 mL Infiltration Once  . loratadine  10 mg Oral Daily  . methylPREDNISolone (SOLU-MEDROL) injection  60 mg Intravenous Q12H  . multivitamin  1 tablet Oral QHS  . sevelamer carbonate  2,400 mg Oral TID WC  . sodium chloride flush  3 mL Intravenous Q12H  . zolpidem  5 mg Oral QHS   Continuous Infusions: . sodium chloride    . cefTRIAXone (ROCEPHIN)  IV 2 g (10/29/17 0553)  . ferric gluconate (FERRLECIT/NULECIT) IV Stopped (10/28/17 1437)     LOS: 1 day    Time spent: 35 minutes    Irine Seal, MD [facility] Pager [phone]  If 7PM-7AM, please contact night-coverage [email or web address] Password [email or web address] 10/29/2017, 1:32 PM

## 2017-10-29 NOTE — Progress Notes (Signed)
HD tx initiated via HD cath w/o problem, pull/push/flush equally w/o problem, VSS, pt will not answer questions for assess, stated he didn't want to talk and doesn't want me talking to him, pt is not cooperative w/ care as he already has taken his [place] off and is sating from low 80's -  Low 90's on RA and is obviously having trouble breathing.

## 2017-10-30 DIAGNOSIS — F4323 Adjustment disorder with mixed anxiety and depressed mood: Secondary | ICD-10-CM

## 2017-10-30 DIAGNOSIS — K7460 Unspecified cirrhosis of liver: Secondary | ICD-10-CM

## 2017-10-30 DIAGNOSIS — R1084 Generalized abdominal pain: Secondary | ICD-10-CM

## 2017-10-30 DIAGNOSIS — K769 Liver disease, unspecified: Secondary | ICD-10-CM | POA: Diagnosis present

## 2017-10-30 DIAGNOSIS — K652 Spontaneous bacterial peritonitis: Secondary | ICD-10-CM | POA: Diagnosis present

## 2017-10-30 LAB — CBC
HCT: 30.9 % — ABNORMAL LOW (ref 39.0–52.0)
Hemoglobin: 10.4 g/dL — ABNORMAL LOW (ref 13.0–17.0)
MCH: 25.6 pg — ABNORMAL LOW (ref 26.0–34.0)
MCHC: 33.7 g/dL (ref 30.0–36.0)
MCV: 75.9 fL — ABNORMAL LOW (ref 78.0–100.0)
Platelets: 212 10*3/uL (ref 150–400)
RBC: 4.07 MIL/uL — ABNORMAL LOW (ref 4.22–5.81)
RDW: 20.4 % — ABNORMAL HIGH (ref 11.5–15.5)
WBC: 12.6 10*3/uL — ABNORMAL HIGH (ref 4.0–10.5)

## 2017-10-30 LAB — BASIC METABOLIC PANEL
Anion gap: 15 (ref 5–15)
BUN: 40 mg/dL — ABNORMAL HIGH (ref 6–20)
CO2: 27 mmol/L (ref 22–32)
Calcium: 9.2 mg/dL (ref 8.9–10.3)
Chloride: 89 mmol/L — ABNORMAL LOW (ref 101–111)
Creatinine, Ser: 6.44 mg/dL — ABNORMAL HIGH (ref 0.61–1.24)
GFR calc Af Amer: 10 mL/min — ABNORMAL LOW (ref >60)
GFR calc non Af Amer: 9 mL/min — ABNORMAL LOW (ref >60)
Glucose, Bld: 213 mg/dL — ABNORMAL HIGH (ref 65–99)
Potassium: 4.8 mmol/L (ref 3.5–5.1)
Sodium: 131 mmol/L — ABNORMAL LOW (ref 135–145)

## 2017-10-30 MED ORDER — FLUTICASONE PROPIONATE 50 MCG/ACT NA SUSP: 2.0000 | Freq: Every day | NASAL | 0 refills | Status: DC

## 2017-10-30 MED ORDER — ALPRAZOLAM 0.25 MG PO TABS: 0.2500 mg | ORAL_TABLET | Freq: Three times a day (TID) | ORAL | 0 refills | Status: DC | PRN

## 2017-10-30 MED ORDER — ALPRAZOLAM 0.25 MG PO TABS
0.2500 mg | ORAL_TABLET | Freq: Three times a day (TID) | ORAL | Status: DC | PRN
  Administered 2017-10-30: 0.25 mg via ORAL
  Filled 2017-10-30: qty 1

## 2017-10-30 MED ORDER — LACTULOSE 10 GM/15ML PO SOLN: 20.0000 g | Freq: Every day | ORAL | 0 refills | Status: DC

## 2017-10-30 MED ORDER — CEFTAZIDIME 1 G IV SOLR: 1.0000 g | INTRAVENOUS | 0 refills | Status: DC

## 2017-10-30 MED ORDER — ACETAMINOPHEN 325 MG PO TABS: 650.0000 mg | ORAL_TABLET | Freq: Four times a day (QID) | ORAL | Status: DC | PRN

## 2017-10-30 MED ORDER — PREDNISONE 20 MG PO TABS: 40.0000 mg | ORAL_TABLET | Freq: Every day | ORAL | 0 refills | Status: DC

## 2017-10-30 MED ORDER — LORATADINE 10 MG PO TABS: 10.0000 mg | ORAL_TABLET | Freq: Every day | ORAL | 0 refills | Status: DC

## 2017-10-30 MED ORDER — PREDNISONE 20 MG PO TABS
40.0000 mg | ORAL_TABLET | Freq: Every day | ORAL | Status: DC
  Administered 2017-10-30: 40 mg via ORAL
  Filled 2017-10-30: qty 2

## 2017-10-30 MED ORDER — GUAIFENESIN 100 MG/5ML PO SOLN: 5.0000 mL | ORAL | 0 refills | Status: DC | PRN

## 2017-10-30 NOTE — Discharge Summary (Signed)
Physician Discharge Summary  [name] XBM:[id number] DOB: 1961-05-10 DOA: 10/27/2017  PCP: [name], MD  Admit date: 10/27/2017 Discharge date: 10/30/2017  Time spent: 60 minutes  Recommendations for Outpatient Follow-up:  1. Follow-up with [name], MD 1-2 weeks.  On follow-up patient's anxiety will need to be reassessed as patient was discharged on a few pills of Xanax.  Patient's COPD will need to be followed up upon. 2. Follow up in hemodialysis on 10/31/2017.  Patient will need IV [name] during his HD sessions times 1 week. 3. Follow-up with gastroenterology for further management of cirrhosis, follow-up on presumed SBP.   Discharge Diagnoses:  Principal Problem:   Ascites Active Problems:   SBP (spontaneous bacterial peritonitis) ([place]): PRESUMED.   Cirrhosis of liver with ascites (HCC)   Cigarette smoker   Chronic hepatitis B (HCC)   Essential hypertension   COPD (chronic obstructive pulmonary disease) (HCC)   Adjustment disorder with mixed anxiety and depressed mood   CKD (chronic kidney disease) stage V requiring chronic dialysis ([place])   Anemia due to chronic kidney disease   Abdominal pain   SOB (shortness of breath)   Discharge Condition: Stable and improved  Diet recommendation: Heart healthy  Filed Weights   10/28/17 2240 10/29/17 0257 10/29/17 2126  Weight: 71 kg (156 lb 8.4 oz) 68 kg (149 lb 14.6 oz) 68 kg (149 lb 14.6 oz)    History of present illness:  Per Dr. Marian Sorrow Ahmod Gillespie is a 57 y.o. male with medical history significant for end-stage renal disease on hemodialysis Monday Wednesday Friday, cirrhosis secondary to hepatitis C, hepatitis B and alcohol abuse, polysubstance abuse with history of suicide ideation, atrial flutter with RVR, COPD, presented to the emergency department with a chief complaint of abdominal pain. Of note this is his 12th admission over the last 6 months. Patient had a CT of the abdomen pelvis 2  days prior to admission revealing a large volume ascites he was referred to the ED for paracentesis. [facility] were asked to admit.  Information was obtained from the chart and the patient.he was seen in the ER 3 days ago. At that time he had a CT scan that showed large volume ascites. He reports since that time his abdomen has just continued to become more distended and abdominal pain worsening. He reported the pain was constant described it as a pressure/ache located throughout his abdomen. He also reported a tendency towards constipation and has not had a bowel movement in 2 days. He denied any fever or headache nausea vomiting. He reported intermittent nausea without vomiting and a decreased oral intake. He denied chest pain palpitation but did endorse some worsening shortness of breath. He stated he goes to dialysis Monday Wednesday and Friday and his last dialysis session was 2 days ago.   ED Course: in the emergency department is afebrile hemodynamically stable with mild tachycardias oxygen saturation levels greater then 90% on room air.    Hospital Course:  1 abdominal pain in the setting of cirrhosis/ascites/presumed SBP CT abdomen and pelvis done 10/25/2017 with large volume ascites noted.  Presented with abdominal pain.  Patient with CT scan concerning for cirrhosis.  Patient noted to have paracentesis done in March and one done last month with 1.6 L of fluid removed.  Patient given a dose of IV Rocephin in the ED and maintained on IV Rocephin during the hospitalization.  INR was 1.2.  Patient status post diagnostic and therapeutic paracentesis with 5 mL of  fluid removed labs pending.  Cultures from paracentesis with no growth to date.  Peritoneal fluid obtained was noted to have a LDH of 225, WBC of 4900 with a neutrophil count of 86 and cloudy in appearance.  Due to patient's history of end-stage renal disease, cirrhosis, history of hepatitis B and C patient was maintained on IV  Rocephin during the hospitalization will be discharged to receive IV Fortaz during hemodialysis x1 more week.  Patient will need to follow-up with GI in the outpatient setting for further management.  Patient may need to be placed on SBP prophylaxis after his finishes course of IV [name] however will defer to outpatient GI.  Patient will be called by GI office to schedule outpatient appointment.  Patient improved clinically.  Did not have any further abdominal pain during the hospitalization.  Remained afebrile.  Patient will be discharged in stable and improved condition.  2.  Cirrhosis secondary to hepatitis B/hepatitis C/alcoholism Patient with a history of noncompliance.  Continues to drink.  Will need outpatient follow-up with PCP and GI for continued and further management of his cirrhosis.  GI  office will call with outpatient appointment time.   3.  End-stage renal disease Patient on hemodialysis Monday Wednesday Friday.  Patient has been seen by nephrology and patient underwent hemodialysis during this hospitalization.  Patient will follow-up in the outpatient setting for his hemodialysis on Monday, 10/31/2017.    4.  Shortness of breath/dyspnea/??  Mild COPD exacerbation Patient noted with poor to fair air movement.  Patient with history of COPD.  Concern for possible mild COPD exacerbation.  Repeat chest x-ray which was done showed resolution of fluid overload on admission.  Patient was given a couple doses of IV Solu-Medrol and placed on Pulmicort, scheduled nebs, Flonase and Claritin.  Patient improved clinically.  Patient be discharged home on prednisone 40 mg daily times 5 days.  Patient is to continue with his Symbicort, Spiriva.  Patient was discharged on Claritin, Flonase and a PPI.  Outpatient follow-up with PCP.   5.  Hypertension Patient maintained on home regimen of hydralazine,imdur, Cardizem CD.  6.  Anxiety Patient noted to be anxious during the hospitalization noted to be  biting his fingers which has been ongoing prior to admission.  Patient was placed on Xanax 0.25 mg 3 times daily as needed.  Patient is to follow-up with his PCP in the outpatient setting for further management.    7.  ESRD on HD Patient seen in consultation by nephrology and followed throughout the hospitalization.  Patient received hemodialysis during the hospitalization.  Patient be discharged back to his hemodialysis center will need to be given IV Fortaz during hemodialysis times 1 week for presumed SBP.    8.  Polysubstance abuse Social work consulted.  9.  Moderate mitral stenosis Outpatient follow-up.    Procedures:  CT abdomen and pelvis 10/25/2017  Chest x-ray 10/27/2017, 10/29/2017  Ultrasound-guided diagnostic and therapeutic paracentesis with 5 mL of peritoneal fluid removed 10/28/2017--Kacie Zigmund Daniel, [place]      Consultations:  Nephrology:Dr Schertz 10/28/2017      Discharge Exam: Vitals:   10/30/17 0919 10/30/17 0922  BP:    Pulse:    Resp:    Temp:    SpO2: 99% 99%    General: NAD Cardiovascular: RRR Respiratory: CTAB Gi: Mildly distended, soft, nontender to palpation, positive bowel sounds.  Discharge Instructions   Discharge Instructions    Diet - low sodium heart healthy   Complete by:  As  directed    Increase activity slowly   Complete by:  As directed      Allergies as of 10/30/2017      Reactions   Aspirin Other (See Comments)   STOMACH PAIN   Tylenol [acetaminophen]    Stomach ache   Clonidine Derivatives Itching   Tramadol Itching      Medication List    STOP taking these medications   Fluticasone-Salmeterol 250-50 MCG/DOSE Aepb Commonly known as:  ADVAIR     TAKE these medications   ALPRAZolam 0.25 MG tablet Commonly known as:  XANAX Take 1 tablet (0.25 mg total) by mouth 3 (three) times daily as needed for anxiety or sleep.   budesonide-formoterol 80-4.5 MCG/ACT inhaler Commonly known as:  SYMBICORT Inhale 2 puffs  into the lungs 2 (two) times daily.   cefTAZidime 1 g Solr injection Commonly known as:  FORTAZ Inject 1 g into the vein every Monday, Wednesday, and Friday. In HD x 1 week. Start taking on:  10/31/2017   clopidogrel 75 MG tablet Commonly known as:  PLAVIX Take 1 tablet (75 mg total) by mouth daily.   diltiazem 120 MG 24 hr capsule Commonly known as:  CARDIZEM CD Take 1 capsule (120 mg total) by mouth daily.   famotidine 20 MG tablet Commonly known as:  PEPCID One at bedtime What changed:    how much to take  how to take this  when to take this  additional instructions   feeding supplement (NEPRO CARB STEADY) Liqd Take 237 mLs by mouth 2 (two) times daily between meals.   fluticasone 50 MCG/ACT nasal spray Commonly known as:  FLONASE Place 2 sprays into both nostrils daily. Start taking on:  10/31/2017   gabapentin 100 MG capsule Commonly known as:  NEURONTIN Take 1 capsule (100 mg total) by mouth at bedtime. What changed:  when to take this   guaiFENesin 100 MG/5ML Soln Commonly known as:  ROBITUSSIN Take 5 mLs (100 mg total) by mouth every 4 (four) hours as needed for cough or to loosen phlegm.   hydrALAZINE 100 MG tablet Commonly known as:  APRESOLINE Take 1 tablet by mouth twice a day take after HD on dialysis days   isosorbide mononitrate 30 MG 24 hr tablet Commonly known as:  IMDUR Take 1 tablet (30 mg total) by mouth daily.   loratadine 10 MG tablet Commonly known as:  CLARITIN Take 1 tablet (10 mg total) by mouth daily. Start taking on:  10/31/2017   nicotine 21 mg/24hr patch Commonly known as:  NICODERM CQ - dosed in mg/24 hours Place 21 mg onto the skin daily.   oxyCODONE 5 MG immediate release tablet Commonly known as:  Oxy IR/ROXICODONE Take 5 mg by mouth 2 (two) times daily as needed for moderate pain.   pantoprazole 40 MG tablet Commonly known as:  PROTONIX Take 1 tablet (40 mg total) by mouth daily. Take 30-60 min before first meal of  the day   predniSONE 20 MG tablet Commonly known as:  DELTASONE Take 2 tablets (40 mg total) by mouth daily before breakfast for 5 days. Start taking on:  10/31/2017   SPIRIVA HANDIHALER 18 MCG inhalation capsule Generic drug:  tiotropium Place 1 puff into inhaler and inhale daily.   VENTOLIN HFA 108 (90 Base) MCG/ACT inhaler Generic drug:  albuterol Inhale 2 puffs into the lungs every 6 (six) hours as needed for wheezing or shortness of breath.      Allergies  Allergen Reactions  .  Aspirin Other (See Comments)    STOMACH PAIN  . Tylenol [Acetaminophen]     Stomach ache  . Clonidine Derivatives Itching  . Tramadol Itching   Follow-up Information    Osei-Bonsu, [name], MD. Schedule an appointment as soon as possible for a visit in 1 week(s).   Specialty:  Internal Medicine Why:  f/u in 1-2 weeks Contact information: [address] [place] [address] [phone]        HD Follow up on 10/31/2017.   Why:  F/U IN HD AS SCHEDULED.       [facility] Follow up.   Specialty:  Gastroenterology Why:  Office will call with apponitment time to f/u on your cirrhosis Contact information: [place] [id number] [phone]           The results of significant diagnostics from this hospitalization (including imaging, microbiology, ancillary and laboratory) are listed below for reference.    Significant Diagnostic Studies: Dg Chest 2 View  Result Date: 10/27/2017 CLINICAL DATA:  Sharp abdominal pain for 3 days.  Dyspnea. EXAM: CHEST - 2 VIEW COMPARISON:  10/03/2017 FINDINGS: Stable cardiomegaly with aortic atherosclerosis. Dialysis catheter tip from right IJ approach terminates in the mid right atrium. Interstitial edema is noted with left basilar atelectasis and chronic elevation of the left hemidiaphragm. No acute nor suspicious osseous abnormality. IMPRESSION: 1. Stable cardiomegaly with mild interstitial  pulmonary edema, slightly worse than on comparison. No effusion or pneumothorax. 2. Aortic atherosclerosis without aneurysm. Electronically Signed   By: Ashley Royalty M.D.   On: 10/27/2017 18:45   Dg Chest 2 View  Result Date: 10/03/2017 CLINICAL DATA:  Shortness of breath. EXAM: CHEST - 2 VIEW COMPARISON:  Chest x-ray dated September 28, 2017. FINDINGS: Unchanged tunneled right internal jugular dialysis catheter with the tip in the right atrium. Stable cardiomegaly and mild pulmonary vascular congestion. New increased elevation of the left hemidiaphragm with adjacent left basilar atelectasis. No focal consolidation, pleural effusion, or pneumothorax. No acute osseous abnormality. Gaseous distention of the stomach. IMPRESSION: 1. Stable cardiomegaly and mild pulmonary vascular congestion. 2. New increased elevation of the left hemidiaphragm with adjacent left lower lobe atelectasis. Electronically Signed   By: Titus Dubin M.D.   On: 10/03/2017 15:57   Ct Abdomen Pelvis W Contrast  Result Date: 10/25/2017 CLINICAL DATA:  Acute abdominal pain, generalized. EXAM: CT ABDOMEN AND PELVIS WITH CONTRAST TECHNIQUE: Multidetector CT imaging of the abdomen and pelvis was performed using the standard protocol following bolus administration of intravenous contrast. CONTRAST:  60mL ISOVUE-300 IOPAMIDOL (ISOVUE-300) INJECTION 61% COMPARISON:  09/28/2017 FINDINGS: Lower chest: Cardiomegaly. Central line with tip in the right atrium. Overall small pericardial effusion, stable. Extensive atherosclerotic calcification of the coronaries and aorta. Hepatobiliary: Lobulated liver surface and large caudate lobe suggesting cirrhosis. No focal mass. Patent portal vein.No evidence of biliary obstruction or stone. Pancreas: Stable Spleen: Unremarkable. Adrenals/Urinary Tract: Negative adrenals. Marked bilateral renal atrophy with small cystic densities. Patient has history of end-stage renal disease. Unremarkable bladder. Stomach/Bowel:  No obstruction. No visible inflammation. There is a clip noted within the rectum. Vascular/Lymphatic: No acute vascular abnormality. Severe atherosclerosis with confluent calcification along the aorta and visceral branches. No mass or adenopathy. Reproductive:No pathologic findings. Other: There is a large volume ascites without visible loculation. Chronic peritoneal thickening in the pelvis, seen since at least 04/2017 Musculoskeletal: Generalized bony sclerosis, likely renal osteodystrophy. IMPRESSION: 1. No acute finding. 2. Large volume ascites. Chronic peritoneal thickening in the  pelvis, please correlate with recent paracentesis results. 3. Suspected cirrhosis. 4.  Aortic Atherosclerosis (ICD10-I70.0).  Coronary atherosclerosis. Electronically Signed   By: Monte Fantasia M.D.   On: 10/25/2017 09:49   US Paracentesis  Result Date: 10/14/2017 INDICATION: Patient with history of alcoholic cirrhosis, hepatitis B/C, end-stage renal disease, recurrent ascites. Request made for diagnostic and therapeutic paracentesis. EXAM: ULTRASOUND GUIDED DIAGNOSTIC AND THERAPEUTIC PARACENTESIS MEDICATIONS: None COMPLICATIONS: None immediate. PROCEDURE: Informed written consent was obtained from the patient after a discussion of the risks, benefits and alternatives to treatment. A timeout was performed prior to the initiation of the procedure. Initial ultrasound scanning demonstrates a moderate-to-large amount of ascites within the left mid to lower abdominal quadrant. The left mid to lower abdomen was prepped and draped in the usual sterile fashion. 1% lidocaine was used for local anesthesia. Following this, a 6 Fr Safe-T-Centesis catheter was introduced. An ultrasound image was saved for documentation purposes. The paracentesis was performed. The catheter was removed and a dressing was applied. The patient tolerated the procedure well without immediate post procedural complication. FINDINGS: A total of approximately 3.7  liters of bloody fluid was removed. Samples were sent to the laboratory as requested by the clinical team. IMPRESSION: Successful ultrasound-guided diagnostic and therapeutic paracentesis yielding 3.7 liters of bloody peritoneal fluid. Dr. Sheran Fava was notified of the above findings. Read by: Rowe Robert, PA-C Electronically Signed   By: Marybelle Killings M.D.   On: 10/14/2017 11:41   Dg Chest Port 1 View  Result Date: 10/29/2017 CLINICAL DATA:  Chest pain and shortness of breath for 1 day. EXAM: PORTABLE CHEST 1 VIEW COMPARISON:  10/27/2017. FINDINGS: Stable enlarged cardiac silhouette. The right jugular catheter tip is in the right atrium. The left hemidiaphragm remains elevated. Clear lungs with normal vascularity. IMPRESSION: 1. The right jugular catheter tip remains in the mid right atrium. 2. Stable cardiomegaly and elevation of the left hemidiaphragm. Electronically Signed   By: Claudie Revering M.D.   On: 10/29/2017 11:31   Ir Paracentesis  Result Date: 10/28/2017 INDICATION: Patient with recurrent ascites. Request is made for diagnostic and therapeutic paracentesis. EXAM: ULTRASOUND GUIDED DIAGNOSTIC AND THERAPEUTIC PARACENTESIS MEDICATIONS: 10 mL 2% lidocaine COMPLICATIONS: None immediate. PROCEDURE: Informed written consent was obtained from the patient after a discussion of the risks, benefits and alternatives to treatment. A timeout was performed prior to the initiation of the procedure. Initial ultrasound scanning demonstrates a small amount of ascites within the left lateral abdomen. The left lateral abdomen was prepped and draped in the usual sterile fashion. 2% lidocaine was used for local anesthesia. Following this, a 19 gauge, 7-cm, Yueh catheter was introduced. An ultrasound image was saved for documentation purposes. The paracentesis was performed. The catheter was removed and a dressing was applied. The patient tolerated the procedure well without immediate post procedural complication. FINDINGS:  A total of approximately 5 mL of amber fluid was removed. Samples were sent to the laboratory as requested by the clinical team. IMPRESSION: Successful ultrasound-guided diagnostic and therapeutic paracentesis yielding 5 mL liters of peritoneal fluid. Read by: Brynda Greathouse PA-C Electronically Signed   By: Sandi Mariscal M.D.   On: 10/28/2017 12:07    Microbiology: Recent Results (from the past 240 hour(s))  Body fluid culture     Status: None (Preliminary result)   Collection Time: 10/28/17 11:45 AM  Result Value Ref Range Status   Specimen Description FLUID PERITONEAL CAVITY  Final   Special Requests NONE  Final   Gram Stain  Final    FEW WBC PRESENT,BOTH PMN AND MONONUCLEAR NO ORGANISMS SEEN    Culture   Final    NO GROWTH 2 DAYS Performed at [facility], [place] [address], [place], [place] [address]    Report Status PENDING  Incomplete     Labs: Basic Metabolic Panel: Recent Labs  Lab 10/25/17 0754 10/27/17 1708 10/30/17 0338  NA 139 132* 131*  K 3.0* 5.0 4.8  CL 116* 91* 89*  CO2 16* 28 27  GLUCOSE 44* 129* 213*  BUN 17 35* 40*  CREATININE 2.65* 6.78* 6.44*  CALCIUM 5.0* 9.0 9.2   Liver Function Tests: Recent Labs  Lab 10/25/17 0754 10/27/17 1708  AST 19 25  ALT 16* 19  ALKPHOS 98 170*  BILITOT 0.4 0.8  PROT 3.5* 6.6  ALBUMIN 1.7* 3.1*   Recent Labs  Lab 10/25/17 0754 10/27/17 1708  LIPASE 20 28   No results for input(s): AMMONIA in the last 168 hours. CBC: Recent Labs  Lab 10/25/17 0754 10/27/17 1708 10/30/17 0338  WBC 8.8 9.7 12.6*  HGB 9.8* 9.6* 10.4*  HCT 28.8* 28.6* 30.9*  MCV 76.4* 76.1* 75.9*  PLT 218 230 212   Cardiac Enzymes: No results for input(s): CKTOTAL, CKMB, CKMBINDEX, TROPONINI in the last 168 hours. BNP: BNP (last 3 results) Recent Labs    07/08/17 1959 08/20/17 1455 08/29/17 1235  BNP 1,100.4* 1,418.4* 943.6*    ProBNP (last 3 results) No results for input(s): PROBNP in the last 8760 hours.  CBG: Recent  Labs  Lab 10/25/17 0927  GLUCAP 155*       Signed:  Irine Seal MD.  [facility] 10/30/2017, 7:59 PM

## 2017-10-30 NOTE — Progress Notes (Signed)
Discharge instructions, medications/prescriptions and f/u appt discussed and reviewed with pt, verbalized understanding. Copy of AVS given. Pt was escorted out of the unit in wheelchair, took all belongings with him.

## 2017-10-30 NOTE — Progress Notes (Addendum)
[facility] Progress Note  Subjective: Breathing better. Repeat CXR improved post HD. S/p paracentesis only 67mL removed. C/o anxiety and asking for Xanax    Vitals:   10/29/17 2126 10/30/17 0607 10/30/17 0919 10/30/17 0922  BP: (!) 154/86 112/75    Pulse: (!) 109 (!) 104    Resp: 19 19    Temp: 98.3 F (36.8 C) 98 F (36.7 C)    TempSrc: Oral Oral    SpO2: 100% 100% 99% 99%  Weight: 68 kg (149 lb 14.6 oz)     Height:        Inpatient medications: . budesonide (PULMICORT) nebulizer solution  0.5 mg Nebulization BID  . diltiazem  120 mg Oral Daily  . feeding supplement (NEPRO CARB STEADY)  237 mL Oral BID BM  . fluticasone  2 spray Each Nare Daily  . gabapentin  100 mg Oral BID  . hydrALAZINE  100 mg Oral Q8H  . isosorbide mononitrate  30 mg Oral Daily  . lactulose  20 g Oral Daily  . lidocaine  20 mL Infiltration Once  . loratadine  10 mg Oral Daily  . multivitamin  1 tablet Oral QHS  . predniSONE  40 mg Oral QAC breakfast  . sevelamer carbonate  2,400 mg Oral TID WC  . sodium chloride flush  3 mL Intravenous Q12H   . sodium chloride    . cefTRIAXone (ROCEPHIN)  IV Stopped (10/30/17 0710)  . ferric gluconate (FERRLECIT/NULECIT) IV Stopped (10/28/17 1437)   sodium chloride, acetaminophen **OR** acetaminophen, albuterol, ALPRAZolam, calcium carbonate (dosed in mg elemental calcium), camphor-menthol **AND** hydrOXYzine, docusate sodium, feeding supplement (NEPRO CARB STEADY), guaiFENesin, HYDROmorphone (DILAUDID) injection, ondansetron **OR** ondansetron (ZOFRAN) IV, sodium chloride flush, sorbitol  Exam: Alert, appears mild-mod dyspneic No jvd Chest diffusely decreased air movement, no discrete rales or wheezing Cor RRR Abd soft, distended, ascites 2-3+ Ext cachectic , muscle wasting, no LE edema NF, Ox 3, no asterixis  Dialysis: [facility] MWF 70kg   2/2 bath  TDC/ R AVF not using yet (placed 08/09/17)   Heparin none   Venofer 100mg  IV x10 until  4/24 Mircera 273mcg IV q 2 weeks (last 4/17) Parsabiv 5mg  IV TIW  BMM Renvela 3 qac 2 q snacks       Impression: 1  Abd pain/ ascites - s/p paracentesis 4/19 only 39mL fluid removed.  WBCs 4900 - on Rocephin -per primary  He can get IV abx w/ HD if needed for SBP, have d/w primary MD 2  Cirrhosis/ hepB 3  ESRD - HD MWF. Next HD Monday. IF still here 4  Dyspnea - probable COPD exacerbation, started on IV steroids Sat,  breathing better today 5  Cachexia - ?due to multiorgan failure (ESRD, COPD, cirrhosis) 6  Anxiety - very anxious , starting prn xanax 0.25 tid here, have d/w PMD this am. Discussed with patient that will need PCP to fill as outpatient.  7  Anemia ckd - Hb 9.6, cont IV Fe, esa not due until approx 5/2 8 Dispo - requests transfer to [place] - f/u with kidney center SW at discharge OK for dc from renal standpoint  Plan -  As above   Lynnda Child PA-C [facility] Pager [phone] 10/30/2017,9:57 AM  Pt seen, examined, agree w assess/plan as above with additions as indicated.  [name] MD [employer] pager [phone]    cell [phone] 10/30/2017, 12:23 PM      Recent Labs  Lab 10/25/17  8341 10/27/17 1708 10/30/17 0338  NA 139 132* 131*  K 3.0* 5.0 4.8  CL 116* 91* 89*  CO2 16* 28 27  GLUCOSE 44* 129* 213*  BUN 17 35* 40*  CREATININE 2.65* 6.78* 6.44*  CALCIUM 5.0* 9.0 9.2   Recent Labs  Lab 10/25/17 0754 10/27/17 1708  AST 19 25  ALT 16* 19  ALKPHOS 98 170*  BILITOT 0.4 0.8  PROT 3.5* 6.6  ALBUMIN 1.7* 3.1*   Recent Labs  Lab 10/25/17 0754 10/27/17 1708 10/30/17 0338  WBC 8.8 9.7 12.6*  HGB 9.8* 9.6* 10.4*  HCT 28.8* 28.6* 30.9*  MCV 76.4* 76.1* 75.9*  PLT 218 230 212   Iron/TIBC/Ferritin/ %Sat    Component Value Date/Time   IRON 104 09/03/2017 0800   TIBC 343 09/03/2017 0800   FERRITIN 513 (H) 09/03/2017 0800   IRONPCTSAT 30 09/03/2017 0800

## 2017-10-31 DIAGNOSIS — N2581 Secondary hyperparathyroidism of renal origin: Secondary | ICD-10-CM | POA: Diagnosis not present

## 2017-10-31 DIAGNOSIS — N186 End stage renal disease: Secondary | ICD-10-CM | POA: Diagnosis not present

## 2017-10-31 DIAGNOSIS — D509 Iron deficiency anemia, unspecified: Secondary | ICD-10-CM | POA: Diagnosis not present

## 2017-10-31 DIAGNOSIS — A499 Bacterial infection, unspecified: Secondary | ICD-10-CM | POA: Diagnosis not present

## 2017-10-31 DIAGNOSIS — D631 Anemia in chronic kidney disease: Secondary | ICD-10-CM | POA: Diagnosis not present

## 2017-10-31 LAB — BODY FLUID CULTURE: Culture: NO GROWTH

## 2017-10-31 LAB — PATHOLOGIST SMEAR REVIEW

## 2017-11-01 ENCOUNTER — Telehealth: Payer: Self-pay | Admitting: Internal Medicine

## 2017-11-01 NOTE — Telephone Encounter (Signed)
Left message for patient to call back. I do not see where anyone from this office called patient.

## 2017-11-02 DIAGNOSIS — D631 Anemia in chronic kidney disease: Secondary | ICD-10-CM | POA: Diagnosis not present

## 2017-11-02 DIAGNOSIS — N186 End stage renal disease: Secondary | ICD-10-CM | POA: Diagnosis not present

## 2017-11-02 DIAGNOSIS — D509 Iron deficiency anemia, unspecified: Secondary | ICD-10-CM | POA: Diagnosis not present

## 2017-11-02 DIAGNOSIS — N2581 Secondary hyperparathyroidism of renal origin: Secondary | ICD-10-CM | POA: Diagnosis not present

## 2017-11-02 DIAGNOSIS — A499 Bacterial infection, unspecified: Secondary | ICD-10-CM | POA: Diagnosis not present

## 2017-11-03 ENCOUNTER — Other Ambulatory Visit: Payer: Self-pay

## 2017-11-03 ENCOUNTER — Emergency Department (HOSPITAL_COMMUNITY)
Admission: EM | Admit: 2017-11-03 | Discharge: 2017-11-03 | Disposition: A | Discharge status: Home / Self Care | Payer: Medicare Other | Source: Home / Self Care

## 2017-11-03 ENCOUNTER — Encounter (HOSPITAL_COMMUNITY): Payer: Self-pay | Admitting: Emergency Medicine

## 2017-11-03 DIAGNOSIS — N186 End stage renal disease: Secondary | ICD-10-CM

## 2017-11-03 DIAGNOSIS — R0602 Shortness of breath: Secondary | ICD-10-CM | POA: Diagnosis not present

## 2017-11-03 DIAGNOSIS — Z992 Dependence on renal dialysis: Secondary | ICD-10-CM | POA: Insufficient documentation

## 2017-11-03 DIAGNOSIS — R05 Cough: Secondary | ICD-10-CM | POA: Diagnosis not present

## 2017-11-03 DIAGNOSIS — R1084 Generalized abdominal pain: Secondary | ICD-10-CM

## 2017-11-03 DIAGNOSIS — R110 Nausea: Secondary | ICD-10-CM | POA: Diagnosis not present

## 2017-11-03 DIAGNOSIS — J441 Chronic obstructive pulmonary disease with (acute) exacerbation: Secondary | ICD-10-CM | POA: Diagnosis not present

## 2017-11-03 DIAGNOSIS — Z5321 Procedure and treatment not carried out due to patient leaving prior to being seen by health care provider: Secondary | ICD-10-CM

## 2017-11-03 DIAGNOSIS — K652 Spontaneous bacterial peritonitis: Secondary | ICD-10-CM | POA: Diagnosis not present

## 2017-11-03 DIAGNOSIS — R140 Abdominal distension (gaseous): Secondary | ICD-10-CM | POA: Diagnosis not present

## 2017-11-03 DIAGNOSIS — R5383 Other fatigue: Secondary | ICD-10-CM | POA: Insufficient documentation

## 2017-11-03 DIAGNOSIS — K2970 Gastritis, unspecified, without bleeding: Secondary | ICD-10-CM | POA: Diagnosis not present

## 2017-11-03 DIAGNOSIS — R300 Dysuria: Secondary | ICD-10-CM

## 2017-11-03 LAB — COMPREHENSIVE METABOLIC PANEL
ALT: 48 U/L (ref 17–63)
AST: 47 U/L — ABNORMAL HIGH (ref 15–41)
Albumin: 3.1 g/dL — ABNORMAL LOW (ref 3.5–5.0)
Alkaline Phosphatase: 194 U/L — ABNORMAL HIGH (ref 38–126)
Anion gap: 13 (ref 5–15)
BUN: 29 mg/dL — ABNORMAL HIGH (ref 6–20)
CO2: 25 mmol/L (ref 22–32)
Calcium: 8.7 mg/dL — ABNORMAL LOW (ref 8.9–10.3)
Chloride: 90 mmol/L — ABNORMAL LOW (ref 101–111)
Creatinine, Ser: 4.94 mg/dL — ABNORMAL HIGH (ref 0.61–1.24)
GFR calc Af Amer: 14 mL/min — ABNORMAL LOW (ref >60)
GFR calc non Af Amer: 12 mL/min — ABNORMAL LOW (ref >60)
Glucose, Bld: 93 mg/dL (ref 65–99)
Potassium: 4.8 mmol/L (ref 3.5–5.1)
Sodium: 128 mmol/L — ABNORMAL LOW (ref 135–145)
Total Bilirubin: 1 mg/dL (ref 0.3–1.2)
Total Protein: 6.7 g/dL (ref 6.5–8.1)

## 2017-11-03 LAB — CBC
HCT: 31.4 % — ABNORMAL LOW (ref 39.0–52.0)
Hemoglobin: 10.7 g/dL — ABNORMAL LOW (ref 13.0–17.0)
MCH: 26.1 pg (ref 26.0–34.0)
MCHC: 34.1 g/dL (ref 30.0–36.0)
MCV: 76.6 fL — ABNORMAL LOW (ref 78.0–100.0)
Platelets: 192 10*3/uL (ref 150–400)
RBC: 4.1 MIL/uL — ABNORMAL LOW (ref 4.22–5.81)
RDW: 20.9 % — ABNORMAL HIGH (ref 11.5–15.5)
WBC: 13.5 10*3/uL — ABNORMAL HIGH (ref 4.0–10.5)

## 2017-11-03 LAB — LIPASE, BLOOD: Lipase: 39 U/L (ref 11–51)

## 2017-11-03 NOTE — ED Notes (Signed)
Called patient to reassess. Final call X3

## 2017-11-03 NOTE — ED Triage Notes (Signed)
Pt arrived by [facility] for generalized abd pain and painful urination. Has generalized fatigue. Last dialysis was yesterday.

## 2017-11-03 NOTE — ED Triage Notes (Addendum)
Patient complains of abdominal pain, loss of appetite, diarrhea, and generalized weakness that started this morning. Patient states he was discharged Sunday after being admitted for a UTI. Patient alert and oriented at this time and in no apparent distress. ESRD on dialysis, maturing graft in right upper arm, old damaged graft in left arm, currently using port in chest. Last full dialysis yesterday.

## 2017-11-03 NOTE — ED Notes (Signed)
Called Pt to reassess. No answer X2

## 2017-11-03 NOTE — ED Notes (Signed)
Called Pt to reassess. No answer X1

## 2017-11-04 ENCOUNTER — Inpatient Hospital Stay (HOSPITAL_COMMUNITY): Payer: Medicare Other

## 2017-11-04 ENCOUNTER — Inpatient Hospital Stay (HOSPITAL_COMMUNITY)
Admission: EM | Admit: 2017-11-04 | Discharge: 2017-11-12 | DRG: 371 | Discharge status: Against Medical Advice | Payer: Medicare Other | Attending: Internal Medicine | Admitting: Internal Medicine

## 2017-11-04 ENCOUNTER — Other Ambulatory Visit: Payer: Self-pay

## 2017-11-04 ENCOUNTER — Emergency Department (HOSPITAL_COMMUNITY): Payer: Medicare Other

## 2017-11-04 ENCOUNTER — Encounter (HOSPITAL_COMMUNITY): Payer: Self-pay

## 2017-11-04 DIAGNOSIS — F329 Major depressive disorder, single episode, unspecified: Secondary | ICD-10-CM | POA: Diagnosis present

## 2017-11-04 DIAGNOSIS — I700 Atherosclerosis of aorta: Secondary | ICD-10-CM | POA: Diagnosis present

## 2017-11-04 DIAGNOSIS — R110 Nausea: Secondary | ICD-10-CM | POA: Diagnosis not present

## 2017-11-04 DIAGNOSIS — K7030 Alcoholic cirrhosis of liver without ascites: Secondary | ICD-10-CM | POA: Diagnosis not present

## 2017-11-04 DIAGNOSIS — F17210 Nicotine dependence, cigarettes, uncomplicated: Secondary | ICD-10-CM | POA: Diagnosis not present

## 2017-11-04 DIAGNOSIS — R197 Diarrhea, unspecified: Secondary | ICD-10-CM | POA: Diagnosis not present

## 2017-11-04 DIAGNOSIS — A0472 Enterocolitis due to Clostridium difficile, not specified as recurrent: Secondary | ICD-10-CM | POA: Diagnosis present

## 2017-11-04 DIAGNOSIS — N2581 Secondary hyperparathyroidism of renal origin: Secondary | ICD-10-CM | POA: Diagnosis not present

## 2017-11-04 DIAGNOSIS — Z800 Family history of malignant neoplasm of digestive organs: Secondary | ICD-10-CM

## 2017-11-04 DIAGNOSIS — K769 Liver disease, unspecified: Secondary | ICD-10-CM

## 2017-11-04 DIAGNOSIS — N189 Chronic kidney disease, unspecified: Secondary | ICD-10-CM | POA: Diagnosis present

## 2017-11-04 DIAGNOSIS — Z992 Dependence on renal dialysis: Secondary | ICD-10-CM | POA: Diagnosis not present

## 2017-11-04 DIAGNOSIS — F17200 Nicotine dependence, unspecified, uncomplicated: Secondary | ICD-10-CM | POA: Diagnosis present

## 2017-11-04 DIAGNOSIS — D696 Thrombocytopenia, unspecified: Secondary | ICD-10-CM | POA: Diagnosis present

## 2017-11-04 DIAGNOSIS — B182 Chronic viral hepatitis C: Secondary | ICD-10-CM | POA: Diagnosis not present

## 2017-11-04 DIAGNOSIS — Z886 Allergy status to analgesic agent status: Secondary | ICD-10-CM | POA: Diagnosis not present

## 2017-11-04 DIAGNOSIS — I120 Hypertensive chronic kidney disease with stage 5 chronic kidney disease or end stage renal disease: Secondary | ICD-10-CM | POA: Diagnosis not present

## 2017-11-04 DIAGNOSIS — I5032 Chronic diastolic (congestive) heart failure: Secondary | ICD-10-CM | POA: Diagnosis present

## 2017-11-04 DIAGNOSIS — I252 Old myocardial infarction: Secondary | ICD-10-CM | POA: Diagnosis not present

## 2017-11-04 DIAGNOSIS — R05 Cough: Secondary | ICD-10-CM | POA: Diagnosis not present

## 2017-11-04 DIAGNOSIS — R5381 Other malaise: Secondary | ICD-10-CM | POA: Diagnosis not present

## 2017-11-04 DIAGNOSIS — F419 Anxiety disorder, unspecified: Secondary | ICD-10-CM | POA: Diagnosis not present

## 2017-11-04 DIAGNOSIS — N186 End stage renal disease: Secondary | ICD-10-CM | POA: Diagnosis not present

## 2017-11-04 DIAGNOSIS — D509 Iron deficiency anemia, unspecified: Secondary | ICD-10-CM | POA: Diagnosis present

## 2017-11-04 DIAGNOSIS — J9811 Atelectasis: Secondary | ICD-10-CM | POA: Diagnosis present

## 2017-11-04 DIAGNOSIS — K652 Spontaneous bacterial peritonitis: Secondary | ICD-10-CM

## 2017-11-04 DIAGNOSIS — J441 Chronic obstructive pulmonary disease with (acute) exacerbation: Secondary | ICD-10-CM | POA: Diagnosis not present

## 2017-11-04 DIAGNOSIS — J42 Unspecified chronic bronchitis: Secondary | ICD-10-CM | POA: Diagnosis not present

## 2017-11-04 DIAGNOSIS — J449 Chronic obstructive pulmonary disease, unspecified: Secondary | ICD-10-CM | POA: Diagnosis present

## 2017-11-04 DIAGNOSIS — R1084 Generalized abdominal pain: Secondary | ICD-10-CM | POA: Diagnosis not present

## 2017-11-04 DIAGNOSIS — I132 Hypertensive heart and chronic kidney disease with heart failure and with stage 5 chronic kidney disease, or end stage renal disease: Secondary | ICD-10-CM | POA: Diagnosis present

## 2017-11-04 DIAGNOSIS — Z515 Encounter for palliative care: Secondary | ICD-10-CM | POA: Diagnosis not present

## 2017-11-04 DIAGNOSIS — R0602 Shortness of breath: Secondary | ICD-10-CM

## 2017-11-04 DIAGNOSIS — Z801 Family history of malignant neoplasm of trachea, bronchus and lung: Secondary | ICD-10-CM

## 2017-11-04 DIAGNOSIS — R627 Adult failure to thrive: Secondary | ICD-10-CM | POA: Diagnosis present

## 2017-11-04 DIAGNOSIS — E871 Hypo-osmolality and hyponatremia: Secondary | ICD-10-CM | POA: Diagnosis present

## 2017-11-04 DIAGNOSIS — F1010 Alcohol abuse, uncomplicated: Secondary | ICD-10-CM | POA: Diagnosis present

## 2017-11-04 DIAGNOSIS — I050 Rheumatic mitral stenosis: Secondary | ICD-10-CM | POA: Diagnosis present

## 2017-11-04 DIAGNOSIS — K219 Gastro-esophageal reflux disease without esophagitis: Secondary | ICD-10-CM | POA: Diagnosis present

## 2017-11-04 DIAGNOSIS — Z87442 Personal history of urinary calculi: Secondary | ICD-10-CM

## 2017-11-04 DIAGNOSIS — B181 Chronic viral hepatitis B without delta-agent: Secondary | ICD-10-CM | POA: Diagnosis not present

## 2017-11-04 DIAGNOSIS — G8929 Other chronic pain: Secondary | ICD-10-CM | POA: Diagnosis present

## 2017-11-04 DIAGNOSIS — Z978 Presence of other specified devices: Secondary | ICD-10-CM | POA: Diagnosis not present

## 2017-11-04 DIAGNOSIS — K7460 Unspecified cirrhosis of liver: Secondary | ICD-10-CM | POA: Diagnosis present

## 2017-11-04 DIAGNOSIS — Z885 Allergy status to narcotic agent status: Secondary | ICD-10-CM | POA: Diagnosis not present

## 2017-11-04 DIAGNOSIS — Z825 Family history of asthma and other chronic lower respiratory diseases: Secondary | ICD-10-CM

## 2017-11-04 DIAGNOSIS — Z7902 Long term (current) use of antithrombotics/antiplatelets: Secondary | ICD-10-CM

## 2017-11-04 DIAGNOSIS — K7031 Alcoholic cirrhosis of liver with ascites: Secondary | ICD-10-CM | POA: Diagnosis present

## 2017-11-04 DIAGNOSIS — K7469 Other cirrhosis of liver: Secondary | ICD-10-CM | POA: Diagnosis not present

## 2017-11-04 DIAGNOSIS — Z888 Allergy status to other drugs, medicaments and biological substances status: Secondary | ICD-10-CM | POA: Diagnosis not present

## 2017-11-04 DIAGNOSIS — Z66 Do not resuscitate: Secondary | ICD-10-CM | POA: Diagnosis not present

## 2017-11-04 DIAGNOSIS — R188 Other ascites: Secondary | ICD-10-CM | POA: Diagnosis not present

## 2017-11-04 DIAGNOSIS — D631 Anemia in chronic kidney disease: Secondary | ICD-10-CM | POA: Diagnosis not present

## 2017-11-04 DIAGNOSIS — R5383 Other fatigue: Secondary | ICD-10-CM | POA: Diagnosis not present

## 2017-11-04 DIAGNOSIS — Z5320 Procedure and treatment not carried out because of patient's decision for unspecified reasons: Secondary | ICD-10-CM | POA: Diagnosis present

## 2017-11-04 DIAGNOSIS — R109 Unspecified abdominal pain: Secondary | ICD-10-CM

## 2017-11-04 DIAGNOSIS — I2721 Secondary pulmonary arterial hypertension: Secondary | ICD-10-CM | POA: Diagnosis present

## 2017-11-04 DIAGNOSIS — Z8249 Family history of ischemic heart disease and other diseases of the circulatory system: Secondary | ICD-10-CM

## 2017-11-04 DIAGNOSIS — I2510 Atherosclerotic heart disease of native coronary artery without angina pectoris: Secondary | ICD-10-CM | POA: Diagnosis present

## 2017-11-04 DIAGNOSIS — F988 Other specified behavioral and emotional disorders with onset usually occurring in childhood and adolescence: Secondary | ICD-10-CM | POA: Diagnosis not present

## 2017-11-04 DIAGNOSIS — I480 Paroxysmal atrial fibrillation: Secondary | ICD-10-CM | POA: Diagnosis present

## 2017-11-04 DIAGNOSIS — Z79899 Other long term (current) drug therapy: Secondary | ICD-10-CM

## 2017-11-04 DIAGNOSIS — Z955 Presence of coronary angioplasty implant and graft: Secondary | ICD-10-CM

## 2017-11-04 DIAGNOSIS — Z808 Family history of malignant neoplasm of other organs or systems: Secondary | ICD-10-CM

## 2017-11-04 DIAGNOSIS — F1011 Alcohol abuse, in remission: Secondary | ICD-10-CM | POA: Diagnosis not present

## 2017-11-04 LAB — COMPREHENSIVE METABOLIC PANEL
ALT: 32 U/L (ref 17–63)
AST: 28 U/L (ref 15–41)
Albumin: 3.3 g/dL — ABNORMAL LOW (ref 3.5–5.0)
Alkaline Phosphatase: 181 U/L — ABNORMAL HIGH (ref 38–126)
Anion gap: 15 (ref 5–15)
BUN: 50 mg/dL — ABNORMAL HIGH (ref 6–20)
CO2: 23 mmol/L (ref 22–32)
Calcium: 8.9 mg/dL (ref 8.9–10.3)
Chloride: 91 mmol/L — ABNORMAL LOW (ref 101–111)
Creatinine, Ser: 6.87 mg/dL — ABNORMAL HIGH (ref 0.61–1.24)
GFR calc Af Amer: 9 mL/min — ABNORMAL LOW (ref >60)
GFR calc non Af Amer: 8 mL/min — ABNORMAL LOW (ref >60)
Glucose, Bld: 120 mg/dL — ABNORMAL HIGH (ref 65–99)
Potassium: 5.4 mmol/L — ABNORMAL HIGH (ref 3.5–5.1)
Sodium: 129 mmol/L — ABNORMAL LOW (ref 135–145)
Total Bilirubin: 1.3 mg/dL — ABNORMAL HIGH (ref 0.3–1.2)
Total Protein: 7 g/dL (ref 6.5–8.1)

## 2017-11-04 LAB — CBC WITH DIFFERENTIAL/PLATELET
Basophils Absolute: 0 10*3/uL (ref 0.0–0.1)
Basophils Relative: 0 %
Eosinophils Absolute: 0.1 10*3/uL (ref 0.0–0.7)
Eosinophils Relative: 1 %
HCT: 28.9 % — ABNORMAL LOW (ref 39.0–52.0)
Hemoglobin: 9.7 g/dL — ABNORMAL LOW (ref 13.0–17.0)
Lymphocytes Relative: 11 %
Lymphs Abs: 1.6 10*3/uL (ref 0.7–4.0)
MCH: 25.7 pg — ABNORMAL LOW (ref 26.0–34.0)
MCHC: 33.6 g/dL (ref 30.0–36.0)
MCV: 76.7 fL — ABNORMAL LOW (ref 78.0–100.0)
Monocytes Absolute: 0.8 10*3/uL (ref 0.1–1.0)
Monocytes Relative: 6 %
Neutro Abs: 11.6 10*3/uL — ABNORMAL HIGH (ref 1.7–7.7)
Neutrophils Relative %: 82 %
Platelets: 158 10*3/uL (ref 150–400)
RBC: 3.77 MIL/uL — ABNORMAL LOW (ref 4.22–5.81)
RDW: 21.4 % — ABNORMAL HIGH (ref 11.5–15.5)
WBC: 14.1 10*3/uL — ABNORMAL HIGH (ref 4.0–10.5)

## 2017-11-04 LAB — BASIC METABOLIC PANEL
Anion gap: 17 — ABNORMAL HIGH (ref 5–15)
BUN: 38 mg/dL — ABNORMAL HIGH (ref 6–20)
CO2: 24 mmol/L (ref 22–32)
Calcium: 8.9 mg/dL (ref 8.9–10.3)
Chloride: 86 mmol/L — ABNORMAL LOW (ref 101–111)
Creatinine, Ser: 5.92 mg/dL — ABNORMAL HIGH (ref 0.61–1.24)
GFR calc Af Amer: 11 mL/min — ABNORMAL LOW (ref >60)
GFR calc non Af Amer: 10 mL/min — ABNORMAL LOW (ref >60)
Glucose, Bld: 101 mg/dL — ABNORMAL HIGH (ref 65–99)
Potassium: 5.1 mmol/L (ref 3.5–5.1)
Sodium: 127 mmol/L — ABNORMAL LOW (ref 135–145)

## 2017-11-04 LAB — CBG MONITORING, ED: Glucose-Capillary: 85 mg/dL (ref 65–99)

## 2017-11-04 LAB — C DIFFICILE QUICK SCREEN W PCR REFLEX
C Diff antigen: POSITIVE — AB
C Diff toxin: NEGATIVE

## 2017-11-04 LAB — CLOSTRIDIUM DIFFICILE BY PCR, REFLEXED: Toxigenic C. Difficile by PCR: POSITIVE — AB

## 2017-11-04 MED ORDER — GUAIFENESIN 100 MG/5ML PO SOLN
5.0000 mL | ORAL | Status: DC | PRN
  Administered 2017-11-04 – 2017-11-08 (×10): 100 mg via ORAL
  Filled 2017-11-04 (×7): qty 5
  Filled 2017-11-04: qty 25
  Filled 2017-11-04 (×6): qty 5

## 2017-11-04 MED ORDER — DILTIAZEM HCL ER COATED BEADS 120 MG PO CP24
120.0000 mg | ORAL_CAPSULE | Freq: Every day | ORAL | Status: DC
  Administered 2017-11-04 – 2017-11-11 (×7): 120 mg via ORAL
  Filled 2017-11-04 (×9): qty 1

## 2017-11-04 MED ORDER — HYDRALAZINE HCL 50 MG PO TABS
100.0000 mg | ORAL_TABLET | Freq: Two times a day (BID) | ORAL | Status: DC
  Administered 2017-11-04 – 2017-11-09 (×10): 100 mg via ORAL
  Administered 2017-11-10: 50 mg via ORAL
  Administered 2017-11-10 – 2017-11-11 (×3): 100 mg via ORAL
  Filled 2017-11-04 (×16): qty 2

## 2017-11-04 MED ORDER — SODIUM CHLORIDE 0.9 % IV SOLN
1.0000 g | Freq: Once | INTRAVENOUS | Status: AC
  Administered 2017-11-04: 1 g via INTRAVENOUS
  Filled 2017-11-04: qty 10

## 2017-11-04 MED ORDER — ADULT MULTIVITAMIN W/MINERALS CH
1.0000 | ORAL_TABLET | Freq: Every day | ORAL | Status: DC
  Administered 2017-11-04 – 2017-11-11 (×8): 1 via ORAL
  Filled 2017-11-04 (×10): qty 1

## 2017-11-04 MED ORDER — ALBUMIN HUMAN 25 % IV SOLN
100.0000 g | Freq: Once | INTRAVENOUS | Status: AC
  Administered 2017-11-04: 12.5 g via INTRAVENOUS
  Filled 2017-11-04: qty 400

## 2017-11-04 MED ORDER — LORAZEPAM 1 MG PO TABS
0.0000 mg | ORAL_TABLET | Freq: Four times a day (QID) | ORAL | Status: DC
  Administered 2017-11-04: 2 mg via ORAL
  Administered 2017-11-05 (×2): 1 mg via ORAL
  Administered 2017-11-05: 2 mg via ORAL
  Administered 2017-11-06: 1 mg via ORAL
  Filled 2017-11-04 (×3): qty 1
  Filled 2017-11-04 (×2): qty 2
  Filled 2017-11-04: qty 1

## 2017-11-04 MED ORDER — GABAPENTIN 100 MG PO CAPS
100.0000 mg | ORAL_CAPSULE | Freq: Two times a day (BID) | ORAL | Status: DC
  Administered 2017-11-04 – 2017-11-11 (×16): 100 mg via ORAL
  Filled 2017-11-04 (×17): qty 1

## 2017-11-04 MED ORDER — CLOPIDOGREL BISULFATE 75 MG PO TABS
75.0000 mg | ORAL_TABLET | Freq: Every day | ORAL | Status: DC
  Administered 2017-11-04 – 2017-11-11 (×8): 75 mg via ORAL
  Filled 2017-11-04 (×9): qty 1

## 2017-11-04 MED ORDER — VITAMIN B-1 100 MG PO TABS
100.0000 mg | ORAL_TABLET | Freq: Every day | ORAL | Status: DC
  Administered 2017-11-04 – 2017-11-11 (×8): 100 mg via ORAL
  Filled 2017-11-04 (×9): qty 1

## 2017-11-04 MED ORDER — SODIUM CHLORIDE 0.9 % IV SOLN
2.0000 g | INTRAVENOUS | Status: DC
  Administered 2017-11-05: 2 g via INTRAVENOUS
  Filled 2017-11-04: qty 20

## 2017-11-04 MED ORDER — LORAZEPAM 1 MG PO TABS: 0.0000 mg | ORAL_TABLET | Freq: Two times a day (BID) | ORAL | Status: DC

## 2017-11-04 MED ORDER — LORAZEPAM 2 MG/ML IJ SOLN: 1.0000 mg | Freq: Four times a day (QID) | INTRAMUSCULAR | Status: AC | PRN

## 2017-11-04 MED ORDER — HYDROMORPHONE HCL 1 MG/ML IJ SOLN
0.5000 mg | INTRAMUSCULAR | Status: DC | PRN
  Administered 2017-11-04 – 2017-11-06 (×7): 1 mg via INTRAVENOUS
  Administered 2017-11-06: 0.5 mg via INTRAVENOUS
  Administered 2017-11-06: 1 mg via INTRAVENOUS
  Administered 2017-11-06 – 2017-11-07 (×2): 0.5 mg via INTRAVENOUS
  Administered 2017-11-07: 1 mg via INTRAVENOUS
  Administered 2017-11-07: 0.5 mg via INTRAVENOUS
  Administered 2017-11-08 (×2): 1 mg via INTRAVENOUS
  Administered 2017-11-08: 0.5 mg via INTRAVENOUS
  Administered 2017-11-09 – 2017-11-12 (×11): 1 mg via INTRAVENOUS
  Filled 2017-11-04 (×2): qty 0.5
  Filled 2017-11-04 (×4): qty 1
  Filled 2017-11-04: qty 0.5
  Filled 2017-11-04 (×6): qty 1
  Filled 2017-11-04 (×2): qty 0.5
  Filled 2017-11-04 (×3): qty 1
  Filled 2017-11-04: qty 0.5
  Filled 2017-11-04 (×2): qty 1
  Filled 2017-11-04: qty 0.5
  Filled 2017-11-04 (×5): qty 1

## 2017-11-04 MED ORDER — ALBUTEROL SULFATE (2.5 MG/3ML) 0.083% IN NEBU
2.5000 mg | INHALATION_SOLUTION | Freq: Four times a day (QID) | RESPIRATORY_TRACT | Status: DC | PRN
  Administered 2017-11-05 – 2017-11-08 (×3): 2.5 mg via RESPIRATORY_TRACT
  Filled 2017-11-04 (×3): qty 3

## 2017-11-04 MED ORDER — ALBUMIN HUMAN 25 % IV SOLN
50.0000 g | Freq: Once | INTRAVENOUS | Status: AC | PRN
  Administered 2017-11-04: 50 g via INTRAVENOUS
  Filled 2017-11-04: qty 200

## 2017-11-04 MED ORDER — FOLIC ACID 1 MG PO TABS
1.0000 mg | ORAL_TABLET | Freq: Every day | ORAL | Status: DC
  Administered 2017-11-04 – 2017-11-11 (×8): 1 mg via ORAL
  Filled 2017-11-04 (×9): qty 1

## 2017-11-04 MED ORDER — LIDOCAINE HCL 1 % IJ SOLN
INTRAMUSCULAR | Status: AC
  Filled 2017-11-04: qty 20

## 2017-11-04 MED ORDER — ALBUTEROL SULFATE (2.5 MG/3ML) 0.083% IN NEBU
5.0000 mg | INHALATION_SOLUTION | Freq: Once | RESPIRATORY_TRACT | Status: AC
Start: 2017-11-04 — End: 2017-11-04
  Administered 2017-11-04: 5 mg via RESPIRATORY_TRACT
  Filled 2017-11-04: qty 6

## 2017-11-04 MED ORDER — NICOTINE 21 MG/24HR TD PT24
21.0000 mg | MEDICATED_PATCH | Freq: Every day | TRANSDERMAL | Status: DC
  Administered 2017-11-10 – 2017-11-11 (×2): 21 mg via TRANSDERMAL
  Filled 2017-11-04 (×8): qty 1

## 2017-11-04 MED ORDER — ALPRAZOLAM 0.25 MG PO TABS
0.2500 mg | ORAL_TABLET | Freq: Three times a day (TID) | ORAL | Status: DC | PRN
  Administered 2017-11-04 – 2017-11-09 (×9): 0.25 mg via ORAL
  Filled 2017-11-04 (×10): qty 1

## 2017-11-04 MED ORDER — ISOSORBIDE MONONITRATE ER 30 MG PO TB24
30.0000 mg | ORAL_TABLET | Freq: Every day | ORAL | Status: DC
  Administered 2017-11-04 – 2017-11-11 (×8): 30 mg via ORAL
  Filled 2017-11-04 (×8): qty 1

## 2017-11-04 MED ORDER — SENNOSIDES-DOCUSATE SODIUM 8.6-50 MG PO TABS: 1.0000 | ORAL_TABLET | Freq: Every evening | ORAL | Status: DC | PRN

## 2017-11-04 MED ORDER — SODIUM CHLORIDE 0.9% FLUSH
3.0000 mL | Freq: Two times a day (BID) | INTRAVENOUS | Status: DC
  Administered 2017-11-04 – 2017-11-09 (×11): 3 mL via INTRAVENOUS

## 2017-11-04 MED ORDER — SODIUM CHLORIDE 0.9% FLUSH: 3.0000 mL | INTRAVENOUS | Status: DC | PRN

## 2017-11-04 MED ORDER — LORAZEPAM 1 MG PO TABS
1.0000 mg | ORAL_TABLET | Freq: Four times a day (QID) | ORAL | Status: AC | PRN
  Administered 2017-11-04 – 2017-11-06 (×2): 1 mg via ORAL
  Filled 2017-11-04 (×2): qty 1

## 2017-11-04 MED ORDER — SODIUM CHLORIDE 0.9 % IV SOLN: 250.0000 mL | INTRAVENOUS | Status: DC | PRN

## 2017-11-04 MED ORDER — HEPARIN SODIUM (PORCINE) 5000 UNIT/ML IJ SOLN
5000.0000 [IU] | Freq: Three times a day (TID) | INTRAMUSCULAR | Status: DC
  Filled 2017-11-04 (×11): qty 1

## 2017-11-04 MED ORDER — FENTANYL CITRATE (PF) 100 MCG/2ML IJ SOLN
100.0000 ug | Freq: Once | INTRAMUSCULAR | Status: AC
  Administered 2017-11-04: 100 ug via INTRAVENOUS
  Filled 2017-11-04: qty 2

## 2017-11-04 MED ORDER — PANTOPRAZOLE SODIUM 40 MG PO TBEC
40.0000 mg | DELAYED_RELEASE_TABLET | Freq: Every day | ORAL | Status: DC
  Administered 2017-11-04 – 2017-11-09 (×6): 40 mg via ORAL
  Filled 2017-11-04 (×6): qty 1

## 2017-11-04 MED ORDER — TIOTROPIUM BROMIDE MONOHYDRATE 18 MCG IN CAPS
18.0000 ug | ORAL_CAPSULE | Freq: Every day | RESPIRATORY_TRACT | Status: DC
  Filled 2017-11-04 (×2): qty 5

## 2017-11-04 MED ORDER — ONDANSETRON HCL 4 MG/2ML IJ SOLN: 4.0000 mg | Freq: Four times a day (QID) | INTRAMUSCULAR | Status: DC | PRN

## 2017-11-04 MED ORDER — NEPRO/CARBSTEADY PO LIQD
237.0000 mL | Freq: Two times a day (BID) | ORAL | Status: DC
  Administered 2017-11-04 – 2017-11-11 (×12): 237 mL via ORAL
  Filled 2017-11-04 (×2): qty 237

## 2017-11-04 MED ORDER — MOMETASONE FURO-FORMOTEROL FUM 100-5 MCG/ACT IN AERO
2.0000 | INHALATION_SPRAY | Freq: Two times a day (BID) | RESPIRATORY_TRACT | Status: DC
  Administered 2017-11-05 – 2017-11-11 (×10): 2 via RESPIRATORY_TRACT
  Filled 2017-11-04 (×2): qty 8.8

## 2017-11-04 MED ORDER — HYDROMORPHONE HCL 2 MG/ML IJ SOLN
0.5000 mg | INTRAMUSCULAR | Status: DC | PRN
  Administered 2017-11-04 (×3): 1 mg via INTRAVENOUS
  Filled 2017-11-04 (×3): qty 1

## 2017-11-04 MED ORDER — BISACODYL 5 MG PO TBEC
5.0000 mg | DELAYED_RELEASE_TABLET | Freq: Every day | ORAL | Status: DC | PRN
Start: 2017-11-04 — End: 2017-11-12

## 2017-11-04 MED ORDER — VANCOMYCIN 50 MG/ML ORAL SOLUTION
125.0000 mg | Freq: Four times a day (QID) | ORAL | Status: DC
  Administered 2017-11-04 – 2017-11-10 (×23): 125 mg via ORAL
  Filled 2017-11-04 (×32): qty 2.5

## 2017-11-04 MED ORDER — FENTANYL CITRATE (PF) 100 MCG/2ML IJ SOLN: 50.0000 ug | Freq: Once | INTRAMUSCULAR | Status: DC

## 2017-11-04 MED ORDER — ONDANSETRON HCL 4 MG PO TABS
4.0000 mg | ORAL_TABLET | Freq: Four times a day (QID) | ORAL | Status: DC | PRN
Start: 2017-11-04 — End: 2017-11-12
  Filled 2017-11-04: qty 1

## 2017-11-04 MED ORDER — FAMOTIDINE 20 MG PO TABS
20.0000 mg | ORAL_TABLET | Freq: Every day | ORAL | Status: DC
  Administered 2017-11-04 – 2017-11-11 (×8): 20 mg via ORAL
  Filled 2017-11-04 (×8): qty 1

## 2017-11-04 MED ORDER — LIDOCAINE HCL (PF) 2 % IJ SOLN
INTRAMUSCULAR | Status: AC
  Filled 2017-11-04: qty 10

## 2017-11-04 MED ORDER — IPRATROPIUM BROMIDE 0.02 % IN SOLN
0.5000 mg | Freq: Once | RESPIRATORY_TRACT | Status: AC
  Administered 2017-11-04: 0.5 mg via RESPIRATORY_TRACT
  Filled 2017-11-04: qty 2.5

## 2017-11-04 MED ORDER — THIAMINE HCL 100 MG/ML IJ SOLN
100.0000 mg | Freq: Every day | INTRAMUSCULAR | Status: DC
  Filled 2017-11-04 (×3): qty 2

## 2017-11-04 NOTE — ED Triage Notes (Signed)
Patient BIB [facility] for abdominal pain/distention. States patient came to this ED today but left because "there was too many people" then called EMS because the abdominal pain was unbearable. Patient also c/o diarrhea, generalized weakness, SOB, and productive cough.

## 2017-11-04 NOTE — Progress Notes (Signed)
[facility] Progress Note   Interval History:  57 year old male with ESRD on HD, HTN, COPD, CAD s/p PCI, HepB/HepC, cirrhosis with recurrent ascites. Recent [facility] admit 4/18-4/21/19 with abdominal pain, ascites  s/p paracentesis. Fluid culture neg. Antibiotics started for presumed SBP. He got 2/3 doses of Fortaz at his outpatient dialysis this week. Now admitted abdominal pain, fever, suspected SBP. Abd US showing small amt abd ascites. CXR mild interstital edema K 5.1, Na 127,  WBC 14.1, Hgb 9.1   Seen in room.  Tired wants to rest, hasn't been eating or sleeping well.  Feels bad with abd pain. Endorses cough, whitish phlegm, diarrhea.     Objective Vitals:   11/04/17 0945 11/04/17 1012 11/04/17 1013 11/04/17 1100  BP:  (!) 156/112  (!) 145/102  Pulse: (!) 103  (!) 104 96  Resp:      SpO2: 97%  97% 95%  Weight:       Physical Exam General: Thin ill-appearing male NAD  Heart: RRR Lungs: Breathing unlabored Decreased air movement Abdomen: Distended, tender   Extremities: No LE edema  Dialysis Access: TDC R chest; RUE AVF+bruit    Dialysis Orders:  [facility] MWF EDW 69kg 2/2 bath  TDC/ R AVF not using yet (placed 08/09/17)   Heparin none   Venofer 100mg  IV x10 completed  Mircera 231mcg IV q 2 weeks (last 4/17) Parsabiv 5mg  IV TIW  BMM Renvela 3 qac 2 q snacks   Assessment/Plan: 1. Abd pain/fever - treating for SBP, IV Rocephin per primary  2 Cirrhosis/Hep B/HepC/Recurrent ascites/- Per primary. Last paracentesis 4/19 during previous admit- Fluid cx neg  3. ESRD - MWF. For HD today on schedule  4. Anemia -  Hgb 9.7 Next ESA due 5/1  5. HTN/volume- BP elevated and mild edema on CXR - UF for volume, likely losing weight and need to challenge EDW 6. MBD- Continue VDRA/binders. Parsabiv not on formulary  8 COPD - bronchodilators per primary  7. Nutrition - Renal diet/vitamins/pro supp for low albumin   Lynnda Child PA-C [facility] Pager  [phone] 11/04/2017,12:51 PM  LOS: 0 days   Additional Objective Labs: Basic Metabolic Panel: Recent Labs  Lab 10/30/17 0338 11/03/17 1102 11/04/17 0148  NA 131* 128* 127*  K 4.8 4.8 5.1  CL 89* 90* 86*  CO2 27 25 24   GLUCOSE 213* 93 101*  BUN 40* 29* 38*  CREATININE 6.44* 4.94* 5.92*  CALCIUM 9.2 8.7* 8.9   CBC: Recent Labs  Lab 10/30/17 0338 11/03/17 1102 11/04/17 0148  WBC 12.6* 13.5* 14.1*  NEUTROABS  --   --  11.6*  HGB 10.4* 10.7* 9.7*  HCT 30.9* 31.4* 28.9*  MCV 75.9* 76.6* 76.7*  PLT 212 192 158   Blood Culture    Component Value Date/Time   SDES FLUID PERITONEAL CAVITY 10/28/2017 1145   SPECREQUEST NONE 10/28/2017 1145   CULT  10/28/2017 1145    NO GROWTH 3 DAYS Performed at [facility], [place] [address] [place], [place] [address]    REPTSTATUS 10/31/2017 FINAL 10/28/2017 1145    Cardiac Enzymes: No results for input(s): CKTOTAL, CKMB, CKMBINDEX, TROPONINI in the last 168 hours. CBG: Recent Labs  Lab 11/04/17 0824  GLUCAP 85   Iron Studies: No results for input(s): IRON, TIBC, TRANSFERRIN, FERRITIN in the last 72 hours. Lab Results  Component Value Date   INR 1.25 10/28/2017   INR 1.14 10/14/2017   INR 1.14 09/28/2017   Medications: .  sodium chloride    . [START ON 11/05/2017] cefTRIAXone (ROCEPHIN)  IV     . clopidogrel  75 mg Oral Daily  . diltiazem  120 mg Oral Daily  . famotidine  20 mg Oral QHS  . feeding supplement (NEPRO CARB STEADY)  237 mL Oral BID BM  . folic acid  1 mg Oral Daily  . gabapentin  100 mg Oral BID  . heparin  5,000 Units Subcutaneous Q8H  . hydrALAZINE  100 mg Oral BID  . isosorbide mononitrate  30 mg Oral Daily  . LORazepam  0-4 mg Oral Q6H   Followed by  . [START ON 11/06/2017] LORazepam  0-4 mg Oral Q12H  . mometasone-formoterol  2 puff Inhalation BID  . multivitamin with minerals  1 tablet Oral Daily  . nicotine  21 mg Transdermal Daily  . pantoprazole  40 mg Oral Daily  . sodium chloride flush  3  mL Intravenous Q12H  . thiamine  100 mg Oral Daily   Or  . thiamine  100 mg Intravenous Daily  . tiotropium  18 mcg Inhalation Daily

## 2017-11-04 NOTE — ED Provider Notes (Signed)
[facility] DEPART[facility] Provider Note   CSN: [id number] Arrival date & time: 11/04/17  0033     History   Chief Complaint Chief Complaint  Patient presents with  . Abdominal Pain    HPI [name] is a 57 y.o. male.  The history is provided by the patient.  Abdominal Pain   This is a recurrent problem. The current episode started more than 2 days ago. The problem occurs daily. The problem has been gradually worsening. The pain is located in the generalized abdominal region. The pain is severe. Associated symptoms include diarrhea and nausea. Pertinent negatives include fever. The symptoms are aggravated by certain positions. Nothing relieves the symptoms.   Patient is chronically ill, with history of end-stage renal disease, history of cirrhosis, history of substance abuse presents with increasing abdominal pain.  He reports this pain is similar to when he was admitted recently.  Has been increasingly worsening since last admission.  No known fevers.  No vomiting.  No chest pain.  He also reports increasing cough and shortness of breath.  He denies any missed dialysis sessions. Past Medical History:  Diagnosis Date  . Adenomatous colon polyp    tubular  . Anemia   . Anxiety   . Arthritis    left shoulder  . Atherosclerosis of aorta ([place])   . Cardiomegaly   . Chest pain    DATE UNKNOWN, C/O PERIODICALLY  . Cocaine abuse ([place])   . COPD exacerbation ([place]) 08/17/2016  . Coronary artery disease    stent 02/22/17  . Dialysis patient ([place])    Monday-Wednesday-Friday  . ESRD (end stage renal disease) on dialysis ([place])    "[name]; MWF" (07/04/2017)  . GERD (gastroesophageal reflux disease)    DATE UNKNOWN  . Hemorrhoids   . Hepatitis B, chronic ([place])   . Hepatitis C   . History of kidney stones   . Hyperkalemia   . Hypertension   . Metabolic bone disease    Patient denies  . Mitral stenosis   . Myocardial infarction ([place])   .  Pneumonia   . Pulmonary edema   . Renal disorder   . Renal insufficiency   . Shortness of breath dyspnea    " for the last past year with this dialysis"  . Solitary rectal ulcer syndrome   . Tubular adenoma of colon     Patient Active Problem List   Diagnosis Date Noted  . SBP (spontaneous bacterial peritonitis) ([place]): PRESUMED. 10/30/2017  . Cirrhosis of liver with ascites ([place]) 10/30/2017  . SOB (shortness of breath)   . Abdominal pain 10/28/2017  . Upper airway cough syndrome with flattening on f/v loop 10/13/17 c/w vcd 10/17/2017  . Elevated diaphragm 10/13/2017  . Ileus ([place]) 09/29/2017  . QT prolongation 09/29/2017  . Malnutrition of moderate degree 09/29/2017  . Sinus congestion 09/03/2017  . Symptomatic anemia 09/02/2017  . Other cirrhosis of liver ([place]) 09/02/2017  . Left bundle branch block 09/02/2017  . Mitral stenosis 09/02/2017  . Hematochezia 07/15/2017  . Wide-complex tachycardia ([place])   . Endotracheally intubated   . ESRD (end stage renal disease) ([place]) 07/04/2017  . Acute respiratory failure with hypoxia ([place]) 06/18/2017  . CKD (chronic kidney disease) stage V requiring chronic dialysis ([place]) 06/18/2017  . History of Cocaine abuse ([place]) 06/18/2017  . Hypertension 06/18/2017  . Infection of AV graft for dialysis ([place]) 06/18/2017  . Anxiety 06/18/2017  . Anemia due to chronic kidney disease 06/18/2017  .  Atrial flutter with rapid ventricular response ([place]) 06/18/2017  . Personality disorder ([place]) 06/13/2017  . Cellulitis 06/12/2017  . Adjustment disorder with mixed anxiety and depressed mood 06/10/2017  . Suicidal ideation 06/10/2017  . Arm wound, left, sequela 06/10/2017  . Dyspnea on exertion 05/29/2017  . Tachycardia 05/29/2017  . Hyperkalemia 05/22/2017  . Acute metabolic encephalopathy   . Anemia 04/23/2017  . Ascites 04/23/2017  . COPD (chronic obstructive pulmonary disease) ([place]) 04/23/2017  . Acute on chronic respiratory failure with hypoxia  ([place]) 03/25/2017  . Arrhythmia 03/25/2017  . COPD GOLD 0 with flattening on inps f/v  09/27/2016  . Essential hypertension 09/27/2016  . Fluid overload 08/30/2016  . COPD exacerbation ([place]) 08/17/2016  . Hypertensive urgency 08/17/2016  . Respiratory failure ([place]) 08/17/2016  . Problem with dialysis access ([place]) 07/23/2016  . End stage renal disease ([place]) 12/02/2015  . Chronic hepatitis B ([place]) 03/05/2014  . Chronic hepatitis C without hepatic coma ([place]) 03/05/2014  . Internal hemorrhoids with bleeding, swelling and itching 03/05/2014  . Thrombocytopenia ([place]) 03/05/2014  . Chest pain 02/27/2014  . Alcohol abuse 04/14/2009  . Cigarette smoker 04/14/2009  . GANGLION CYST 04/14/2009    Past Surgical History:  Procedure Laterality Date  . A/V FISTULAGRAM Left 05/26/2017   Procedure: A/V FISTULAGRAM;  Surgeon: Conrad Brownsboro, MD;  Location: [facility];  Service: Cardiovascular;  Laterality: Left;  . APPLICATION OF WOUND VAC Left 06/14/2017   Procedure: APPLICATION OF WOUND VAC;  Surgeon: Katha Cabal, MD;  Location: [facility];  Service: Vascular;  Laterality: Left;  . AV FISTULA PLACEMENT  2012   BELIEVED WAS PLACED IN JUNE  . AV FISTULA PLACEMENT Right 08/09/2017   Procedure: Creation Right arm ARTERIOVENOUS BRACHIOCEPOHALIC FISTULA;  Surgeon: Elam Dutch, MD;  Location: [facility];  Service: Vascular;  Laterality: Right;  . COLONOSCOPY    . CORONARY STENT INTERVENTION N/A 02/22/2017   Procedure: CORONARY STENT INTERVENTION;  Surgeon: Nigel Mormon, MD;  Location: [facility];  Service: Cardiovascular;  Laterality: N/A;  . FLEXIBLE SIGMOIDOSCOPY N/A 07/15/2017   Procedure: FLEXIBLE SIGMOIDOSCOPY;  Surgeon: Carol Ada, MD;  Location: [facility];  Service: Endoscopy;  Laterality: N/A;  . HEMORRHOID BANDING    . I&D EXTREMITY Left 06/01/2017   Procedure: IRRIGATION AND DEBRIDEMENT LEFT ARM HEMATOMA WITH LIGATION OF LEFT ARM AV FISTULA;  Surgeon: Elam Dutch, MD;  Location: [facility];  Service: Vascular;  Laterality: Left;  . I&D EXTREMITY Left 06/14/2017   Procedure: IRRIGATION AND DEBRIDEMENT EXTREMITY;  Surgeon: Katha Cabal, MD;  Location: [facility];  Service: Vascular;  Laterality: Left;  . INSERTION OF DIALYSIS CATHETER  05/30/2017  . INSERTION OF DIALYSIS CATHETER N/A 05/30/2017   Procedure: INSERTION OF DIALYSIS CATHETER;  Surgeon: Elam Dutch, MD;  Location: [facility];  Service: Vascular;  Laterality: N/A;  . IR PARACENTESIS  08/30/2017  . IR PARACENTESIS  09/29/2017  . IR PARACENTESIS  10/28/2017  . LEFT HEART CATH AND CORONARY ANGIOGRAPHY N/A 02/22/2017   Procedure: LEFT HEART CATH AND CORONARY ANGIOGRAPHY;  Surgeon: Nigel Mormon, MD;  Location: [facility];  Service: Cardiovascular;  Laterality: N/A;  . LIGATION OF ARTERIOVENOUS  FISTULA Left 02/09/174   Procedure: Plication of Left Arm Arteriovenous Fistula;  Surgeon: Elam Dutch, MD;  Location: [facility];  Service: Vascular;  Laterality: Left;  . POLYPECTOMY    . REVISON OF ARTERIOVENOUS FISTULA Left 07/13/5850   Procedure: PLICATION OF DISTAL  ANEURYSMAL SEGEMENT OF LEFT UPPER ARM ARTERIOVENOUS FISTULA;  Surgeon: Elam Dutch, MD;  Location: [facility];  Service: Vascular;  Laterality: Left;  . REVISON OF ARTERIOVENOUS FISTULA Left 3/53/2992   Procedure: Plication of Left Upper Arm Fistula ;  Surgeon: Waynetta Sandy, MD;  Location: [facility];  Service: Vascular;  Laterality: Left;  . SKIN GRAFT SPLIT THICKNESS LEG / FOOT Left    SKIN GRAFT SPLIT THICKNESS LEFT ARM DONOR SITE: LEFT ANTERIOR THIGH  . SKIN SPLIT GRAFT Left 07/04/2017   Procedure: SKIN GRAFT SPLIT THICKNESS LEFT ARM DONOR SITE: LEFT ANTERIOR THIGH;  Surgeon: Elam Dutch, MD;  Location: [facility];  Service: Vascular;  Laterality: Left;  . THROMBECTOMY W/ EMBOLECTOMY Left 06/05/2017   Procedure: EXPLORATION OF LEFT ARM FOR BLEEDING; OVERSEWED PROXIMAL FISTULA;  Surgeon: Angelia Mould,  MD;  Location: [facility];  Service: Vascular;  Laterality: Left;  . WOUND EXPLORATION Left 06/03/2017   Procedure: WOUND EXPLORATION WITH WOUND VAC APPLICATION TO LEFT ARM;  Surgeon: Angelia Mould, MD;  Location: [facility];  Service: Vascular;  Laterality: Left;        Home Medications    Prior to Admission medications   Medication Sig Start Date End Date Taking? Authorizing Provider  albuterol (VENTOLIN HFA) 108 (90 Base) MCG/ACT inhaler Inhale 2 puffs into the lungs every 6 (six) hours as needed for wheezing or shortness of breath.    [provider]  ALPRAZolam Duanne Moron) 0.25 MG tablet Take 1 tablet (0.25 mg total) by mouth 3 (three) times daily as needed for anxiety or sleep. 10/30/17   Eugenie Filler, MD  budesonide-formoterol [facility]) 80-4.5 MCG/ACT inhaler Inhale 2 puffs into the lungs 2 (two) times daily. 10/13/17   Tanda Rockers, MD  cefTAZidime (FORTAZ) 1 g SOLR injection Inject 1 g into the vein every Monday, Wednesday, and Friday. In HD x 1 week. 10/31/17   Eugenie Filler, MD  clopidogrel (PLAVIX) 75 MG tablet Take 1 tablet (75 mg total) by mouth daily. 07/15/17   Rhyne, Hulen Shouts, PA-C  diltiazem (CARDIZEM CD) 120 MG 24 hr capsule Take 1 capsule (120 mg total) by mouth daily. 06/23/17 06/23/18  Deatra James, MD  famotidine (PEPCID) 20 MG tablet One at bedtime Patient taking differently: Take 20 mg by mouth at bedtime.  09/08/17   Tanda Rockers, MD  fluticasone (FLONASE) 50 MCG/ACT nasal spray Place 2 sprays into both nostrils daily. 10/31/17   Eugenie Filler, MD  gabapentin (NEURONTIN) 100 MG capsule Take 1 capsule (100 mg total) by mouth at bedtime. Patient taking differently: Take 100 mg by mouth 2 (two) times daily.  09/30/17   Mariel Aloe, MD  guaiFENesin (ROBITUSSIN) 100 MG/5ML SOLN Take 5 mLs (100 mg total) by mouth every 4 (four) hours as needed for cough or to loosen phlegm. 10/30/17   Eugenie Filler, MD  hydrALAZINE (APRESOLINE) 100 MG  tablet Take 1 tablet by mouth twice a day take after HD on dialysis days 08/31/17   [provider]  isosorbide mononitrate (IMDUR) 30 MG 24 hr tablet Take 1 tablet (30 mg total) by mouth daily. 06/17/17   [name], MD  loratadine (CLARITIN) 10 MG tablet Take 1 tablet (10 mg total) by mouth daily. 10/31/17   Eugenie Filler, MD  nicotine (NICODERM CQ - DOSED IN MG/24 HOURS) 21 mg/24hr patch Place 21 mg onto the skin daily.    [provider]  Nutritional Supplements (FEEDING SUPPLEMENT,  NEPRO CARB STEADY,) LIQD Take 237 mLs by mouth 2 (two) times daily between meals. 09/30/17   Mariel Aloe, MD  oxyCODONE (OXY IR/ROXICODONE) 5 MG immediate release tablet Take 5 mg by mouth 2 (two) times daily as needed for moderate pain.  09/22/17   [provider]  pantoprazole (PROTONIX) 40 MG tablet Take 1 tablet (40 mg total) by mouth daily. Take 30-60 min before first meal of the day 09/08/17   Tanda Rockers, MD  predniSONE (DELTASONE) 20 MG tablet Take 2 tablets (40 mg total) by mouth daily before breakfast for 5 days. 10/31/17 11/05/17  Eugenie Filler, MD  SPIRIVA HANDIHALER 18 MCG inhalation capsule Place 1 puff into inhaler and inhale daily. 10/04/17   [provider]    Family History Family History  Problem Relation Age of Onset  . Heart disease Mother   . Lung cancer Mother   . Heart disease Father   . Malignant hyperthermia Father   . COPD Father   . Throat cancer Sister   . Esophageal cancer Sister   . Hypertension Other   . COPD Other   . Colon cancer Neg Hx   . Colon polyps Neg Hx   . Rectal cancer Neg Hx   . Stomach cancer Neg Hx     Social History Social History   Tobacco Use  . Smoking status: Current Every Day Smoker    Packs/day: 0.50    Years: 43.00    Pack years: 21.50    Types: Cigarettes    Start date: 08/13/1973  . Smokeless tobacco: Never Used  Substance Use Topics  . Alcohol use: Not Currently    Frequency: Never    Comment:  quit drinking in 2017  . Drug use: Not Currently    Comment: quit in 2017"     Allergies   Aspirin; Tylenol [acetaminophen]; Clonidine derivatives; and Tramadol   Review of Systems Review of Systems  Constitutional: Positive for fatigue. Negative for fever.  Respiratory: Positive for cough and shortness of breath.   Cardiovascular: Negative for chest pain.  Gastrointestinal: Positive for abdominal pain, diarrhea and nausea. Negative for blood in stool.  Neurological: Positive for weakness.  All other systems reviewed and are negative.    Physical Exam Updated Vital Signs BP (!) 167/87   Resp (!) 22   Wt 72.6 kg (160 lb)   BMI 23.63 kg/m   Physical Exam CONSTITUTIONAL: Chronically ill-appearing HEAD: Normocephalic/atraumatic EYES: EOMI/PERRL ENMT: Mucous membranes moist NECK: supple no meningeal signs SPINE/BACK:entire spine nontender CV: S1/S2 noted, no murmurs/rubs/gallops noted LUNGS: Mild tachypnea, wheezing bilaterally ABDOMEN: soft/distended, diffuse tenderness noted GU:no cva tenderness NEURO: Pt is awake/alert/appropriate, moves all extremitiesx4.  No facial droop.   EXTREMITIES: pulses normal/equal, full ROM SKIN: warm, color normal, dialysis catheter chest PSYCH: Anxious   ED Treatments / Results  Labs (all labs ordered are listed, but only abnormal results are displayed) Labs Reviewed  BASIC METABOLIC PANEL - Abnormal; Notable for the following components:      Result Value   Sodium 127 (*)    Chloride 86 (*)    Glucose, Bld 101 (*)    BUN 38 (*)    Creatinine, Ser 5.92 (*)    GFR calc non Af Amer 10 (*)    GFR calc Af Amer 11 (*)    Anion gap 17 (*)    All other components within normal limits  CBC WITH DIFFERENTIAL/PLATELET - Abnormal; Notable for the following components:  WBC 14.1 (*)    RBC 3.77 (*)    Hemoglobin 9.7 (*)    HCT 28.9 (*)    MCV 76.7 (*)    MCH 25.7 (*)    RDW 21.4 (*)    Neutro Abs 11.6 (*)    All other components  within normal limits    EKG EKG Interpretation  Date/Time:  Friday November 04 2017 00:42:42 EDT Ventricular Rate:  109 PR Interval:    QRS Duration: 171 QT Interval:  445 QTC Calculation: 600 R Axis:   -128 Text Interpretation:  Sinus tachycardia Consider left ventricular hypertrophy Abnormal T, consider ischemia, lateral leads No significant change since last tracing Confirmed by Ripley Fraise ([id number]) on 11/04/2017 12:49:14 AM   Radiology Dg Chest Port 1 View  Result Date: 11/04/2017 CLINICAL DATA:  Productive cough with generalized weakness and dyspnea. EXAM: PORTABLE CHEST 1 VIEW COMPARISON:  10/29/2017 FINDINGS: Stable cardiomegaly with aortic atherosclerosis. Mild interstitial edema. No pulmonary consolidation. Left basilar atelectasis is identified with slight elevation of the left hemidiaphragm, chronic in appearance. Right IJ dialysis catheter tip remains in the mid right atrium. No acute osseous abnormality. IMPRESSION: Cardiomegaly with mild interstitial edema. Aortic atherosclerosis. Dialysis catheter tip in the mid right atrium. Electronically Signed   By: Ashley Royalty M.D.   On: 11/04/2017 03:23    Procedures Procedures    Medications Ordered in ED Medications  fentaNYL (SUBLIMAZE) injection 50 mcg (has no administration in time range)  fentaNYL (SUBLIMAZE) injection 100 mcg (100 mcg Intravenous Given 11/04/17 0245)  albuterol (PROVENTIL) (2.5 MG/3ML) 0.083% nebulizer solution 5 mg (5 mg Nebulization Given 11/04/17 0231)  ipratropium (ATROVENT) nebulizer solution 0.5 mg (0.5 mg Nebulization Given 11/04/17 0231)  cefTRIAXone (ROCEPHIN) 1 g in sodium chloride 0.9 % 100 mL IVPB (1 g Intravenous New Bag/Given 11/04/17 0345)     Initial Impression / Assessment and Plan / ED Course  I have reviewed the triage vital signs and the nursing notes.  Pertinent labs & imaging results that were available during my care of the patient were reviewed by me and considered in my medical  decision making (see chart for details).     2:11 AM Patient with a recent admission for presumed SBP.  Suspicious this may be occurring again however the last time he only had minimal fluid on paracentesis.  He was also wheezing and short of breath.  This will also need to be evaluated 4:20 AM From respiratory standpoint, patient appears to be improved.  However he still reports abdominal pain.  He has diffuse abdominal tenderness.  He had recent CT imaging, therefore I do not feel that these to be repeated.  I have a strong suspicion for repeat SBP.  He did have a temperature up to 100.2 during the ED stay on 4/25, and a white count up to 14 Went ahead and started Rocephin. Discussed with Dr. Myna Hidalgo for admission Patient will also need dialysis later today  Final Clinical Impressions(s) / ED Diagnoses   Final diagnoses:  COPD exacerbation ([place])  Generalized abdominal pain    ED Discharge Orders    None       Ripley Fraise, MD 11/04/17 [phone]

## 2017-11-04 NOTE — Telephone Encounter (Signed)
Pt is currently in ED for COPD exacerbation and abdominal pain  Per the 4.4.19 office visit w/ MW, pt was to have a SNIFF test Other than that, no sign of where we called the patient Would like to speak with patient to see if he is still willing to have the SNIFF test, and if so we should place the order  Per 4.4.19 ov w/ MW: Patient Instructions   Plan A = Automatic = symbicort 80 Take 2 puffs first thing in am and then another 2 puffs about 12 hours later.    Work on inhaler technique:  relax and gently blow all the way out then take a nice smooth deep breath back in, triggering the inhaler at same time you start breathing in.  Hold for up to 5 seconds if you can. Blow out thru nose. Rinse and gargle with water when done      Plan B = Backup Only use your albuterol (Ventolin) as a rescue medication to be used if you can't catch your breath by resting or doing a relaxed purse lip breathing pattern.  - The less you use it, the better it will work when you need it. - Ok to use the inhaler up to 2 puffs  every 4 hours if you must but call for appointment if use goes up over your usual need - Don't leave home without it !!  (think of it like the spare tire for your car)    No other inhalers should be used without checking with this office      Please see patient coordinator before you leave today  to schedule sniff test      Please schedule a follow up office visit in 4 weeks, sooner if needed  with all medications /inhalers/ solutions in hand so we can verify exactly what you are taking. This includes all medications from all doctors and over the counters

## 2017-11-04 NOTE — Progress Notes (Signed)
PROGRESS NOTE    [name]  WJX:[id number] DOB: 03-10-1961 DOA: 11/04/2017 PCP: [name], MD    Brief Narrative: 57 year old man with past medical history relevant for ESRD complicated by anemia, alcoholism, chronic hepatitis B and C complicated by cirrhosis and ascites, COPD, chronic diastolic heart failure by echo on 08/30/2017 with dilated RV, coronary artery disease status post proximal RCA stent placement on 02/22/2017, WHO group 2 severe pulmonary hypertension with very dilated RV by echo paroxysmal atrial fibrillation, recently admitted for abdominal pain and discharged with a diagnosis of spontaneous bacterial peritonitis with negative cultures coming in with recurrent and persistent lower abdominal pain.   Assessment & Plan:   Principal Problem:   SBP (spontaneous bacterial peritonitis) ([place]): PRESUMED. Active Problems:   Alcohol abuse   Chronic hepatitis B (HCC)   Chronic hepatitis C without hepatic coma (HCC)   COPD (chronic obstructive pulmonary disease) (HCC)   Anxiety   Anemia due to chronic kidney disease   ESRD (end stage renal disease) (HCC)   Other cirrhosis of liver (HCC)   Abdominal pain   Hyponatremia   #) Recurrent abdominal pain: IR was unable to find a pocket for paracentesis.  Patient was discharged on cefepime after dialysis and has been taking this regularly.  Unclear why he has such profound abdominal pain.  CT on 10/25/2017 shows chronic peritoneal thickening in the pelvis.  Unclear if this is related to his pain as he describes primarily suprapubic abdominal pain. -Continue ceftriaxone 2 g daily -Pain control as tolerated, we will have to discuss goals of care with patient -Patient received albumin for presumed SBP -Follow-up blood cultures ordered on 11/04/2016 -Stool studies ordered including C. difficile and stool culture  #) Cirrhosis due to alcohol, hepatitis B, hepatitis C: Currently patient does not have any ascites. - We will  order labs to calculate meld score - We will discuss goals of care with patient and family  #) End-stage renal disease: -Nephrology consult -Continue Monday Wednesday Friday dialysis  #) Hypertension: - Continue Isorbid mononitrate 30 mg daily -Continue hydralazine 100 mg Monday Wednesday Friday  #) Coronary artery disease: -Continue clopidogrel 75 mg daily, patient has aspirin allergy - Unclear why patient is not on beta-blocker  History) paroxysmal atrial fibrillation: -Continue diltiazem 120 mg daily -Patient is not on anticoagulation due to prior GI bleed  #) Anemia: Secondary to chronic kidney disease -Stable  #) Hyponatremia: Mild and chronic.  Likely due to cirrhosis.  #) COPD: -Continue to try p.m. daily -Continue ICS/LABA -PRN albuterol  #) Pain/psych: -Continue gabapentin 100 mg twice daily after hemodialysis -Continue as needed alprazolam  Fluids: Restricted Electrolytes: Monitor and support Nutrition: Renal diet with fluid restriction  Prophylaxis: Heparin  Disposition: Pending control of abdominal pain  Full code   Consultants:   Neurology  Procedures: (Don't include imaging studies which can be auto populated. Include things that cannot be auto populated i.e. Echo, Carotid and venous dopplers, Foley, Bipap, HD, tubes/drains, wound vac, central lines etc)  None  Antimicrobials: (specify start and planned stop date. Auto populated tables are space occupying and do not give end dates)  IV ceftriaxone started 11/03/2017   Subjective: Patient continues to report severe suprapubic pain.  He denies any nausea however reports continued diarrhea that is nonbloody.  Had extensive discussion this morning with his younger son about goals of care she has been hospitalized multiple multiple times for ascites and abdominal pain.  His prognosis with progressive liver disease while end-stage renal  disease and continued alcohol abuse is extremely  poor.  Objective: Vitals:   11/04/17 0945 11/04/17 1012 11/04/17 1013 11/04/17 1100  BP:  (!) 156/112  (!) 145/102  Pulse: (!) 103  (!) 104 96  Resp:      SpO2: 97%  97% 95%  Weight:        Intake/Output Summary (Last 24 hours) at 11/04/2017 1330 Last data filed at 11/04/2017 0819 Gross per 24 hour  Intake 200 ml  Output -  Net 200 ml   Filed Weights   11/04/17 0041  Weight: 72.6 kg (160 lb)    Examination:  General exam: Comfortable appearing Respiratory system: Clear to auscultation. Respiratory effort normal. Cardiovascular system: Tachycardic, regular rhythm, no murmurs Gastrointestinal system: Soft, significant suprapubic tenderness to light palpation, plus bowel sounds, no rebound or guarding Central nervous system: Alert and oriented. No focal neurological deficits. Extremities: Noted to have numerous grafts and fistulas over upper extremities with no bruit or thrill Skin: Right upper chest PermCath noted to be clean dry intact Psychiatry: Judgement and insight appear normal. Mood & affect appropriate.     Data Reviewed: I have personally reviewed following labs and imaging studies  CBC: Recent Labs  Lab 10/30/17 0338 11/03/17 1102 11/04/17 0148  WBC 12.6* 13.5* 14.1*  NEUTROABS  --   --  11.6*  HGB 10.4* 10.7* 9.7*  HCT 30.9* 31.4* 28.9*  MCV 75.9* 76.6* 76.7*  PLT 212 192 539   Basic Metabolic Panel: Recent Labs  Lab 10/30/17 0338 11/03/17 1102 11/04/17 0148  NA 131* 128* 127*  K 4.8 4.8 5.1  CL 89* 90* 86*  CO2 27 25 24   GLUCOSE 213* 93 101*  BUN 40* 29* 38*  CREATININE 6.44* 4.94* 5.92*  CALCIUM 9.2 8.7* 8.9   GFR: Estimated Creatinine Clearance: 14.1 mL/min (A) (by C-G formula based on SCr of 5.92 mg/dL (H)). Liver Function Tests: Recent Labs  Lab 11/03/17 1102  AST 47*  ALT 48  ALKPHOS 194*  BILITOT 1.0  PROT 6.7  ALBUMIN 3.1*   Recent Labs  Lab 11/03/17 1102  LIPASE 39   No results for input(s): AMMONIA in the last 168  hours. Coagulation Profile: No results for input(s): INR, PROTIME in the last 168 hours. Cardiac Enzymes: No results for input(s): CKTOTAL, CKMB, CKMBINDEX, TROPONINI in the last 168 hours. BNP (last 3 results) No results for input(s): PROBNP in the last 8760 hours. HbA1C: No results for input(s): HGBA1C in the last 72 hours. CBG: Recent Labs  Lab 11/04/17 0824  GLUCAP 85   Lipid Profile: No results for input(s): CHOL, HDL, LDLCALC, TRIG, CHOLHDL, LDLDIRECT in the last 72 hours. Thyroid Function Tests: No results for input(s): TSH, T4TOTAL, FREET4, T3FREE, THYROIDAB in the last 72 hours. Anemia Panel: No results for input(s): VITAMINB12, FOLATE, FERRITIN, TIBC, IRON, RETICCTPCT in the last 72 hours. Sepsis Labs: No results for input(s): PROCALCITON, LATICACIDVEN in the last 168 hours.  Recent Results (from the past 240 hour(s))  Body fluid culture     Status: None   Collection Time: 10/28/17 11:45 AM  Result Value Ref Range Status   Specimen Description FLUID PERITONEAL CAVITY  Final   Special Requests NONE  Final   Gram Stain   Final    FEW WBC PRESENT,BOTH PMN AND MONONUCLEAR NO ORGANISMS SEEN    Culture   Final    NO GROWTH 3 DAYS Performed at [facility], [address] [place], [place] [address]  Report Status 10/31/2017 FINAL  Final         Radiology Studies: Dg Chest Port 1 View  Result Date: 11/04/2017 CLINICAL DATA:  Productive cough with generalized weakness and dyspnea. EXAM: PORTABLE CHEST 1 VIEW COMPARISON:  10/29/2017 FINDINGS: Stable cardiomegaly with aortic atherosclerosis. Mild interstitial edema. No pulmonary consolidation. Left basilar atelectasis is identified with slight elevation of the left hemidiaphragm, chronic in appearance. Right IJ dialysis catheter tip remains in the mid right atrium. No acute osseous abnormality. IMPRESSION: Cardiomegaly with mild interstitial edema. Aortic atherosclerosis. Dialysis catheter tip in the mid right  atrium. Electronically Signed   By: Ashley Royalty M.D.   On: 11/04/2017 03:23   Ir Abdomen US Limited  Result Date: 11/04/2017 CLINICAL DATA:  Abdominal pain, recurrent abdominal ascites. Paracentesis requested. EXAM: LIMITED ABDOMEN ULTRASOUND FOR ASCITES TECHNIQUE: Limited ultrasound survey for ascites was performed in all four abdominal quadrants. COMPARISON:  10/28/2017 FINDINGS: Survey ultrasound demonstrates a small amount of scattered abdominal ascites. No large pocket to allow safe therapeutic paracentesis. IMPRESSION: Small amount of scattered abdominal ascites. Therapeutic paracentesis deferred. Electronically Signed   By: Lucrezia Europe M.D.   On: 11/04/2017 09:44        Scheduled Meds: . clopidogrel  75 mg Oral Daily  . diltiazem  120 mg Oral Daily  . famotidine  20 mg Oral QHS  . feeding supplement (NEPRO CARB STEADY)  237 mL Oral BID BM  . folic acid  1 mg Oral Daily  . gabapentin  100 mg Oral BID  . heparin  5,000 Units Subcutaneous Q8H  . hydrALAZINE  100 mg Oral BID  . isosorbide mononitrate  30 mg Oral Daily  . LORazepam  0-4 mg Oral Q6H   Followed by  . [START ON 11/06/2017] LORazepam  0-4 mg Oral Q12H  . mometasone-formoterol  2 puff Inhalation BID  . multivitamin with minerals  1 tablet Oral Daily  . nicotine  21 mg Transdermal Daily  . pantoprazole  40 mg Oral Daily  . sodium chloride flush  3 mL Intravenous Q12H  . thiamine  100 mg Oral Daily   Or  . thiamine  100 mg Intravenous Daily  . tiotropium  18 mcg Inhalation Daily   Continuous Infusions: . sodium chloride    . [START ON 11/05/2017] cefTRIAXone (ROCEPHIN)  IV       LOS: 0 days    Time spent: [place], MD [facility]  If 7PM-7AM, please contact night-coverage [email or web address] Password [email or web address] 11/04/2017, 1:30 PM

## 2017-11-04 NOTE — ED Notes (Signed)
Patient wanting to eat at this time, The Nurse was informed.

## 2017-11-04 NOTE — H&P (Signed)
History and Physical    [name] KDX:[id number] DOB: Oct 26, 1960 DOA: 11/04/2017  PCP: [name], MD   Patient coming from: Home  Chief Complaint: Abdominal pain, chills   HPI: [name] is a 57 y.o. male with medical history significant for polysubstance abuse, hepatitis B, hepatitis C, cirrhosis with ascites, end-stage renal disease on hemodialysis, and COPD, now presenting to the emergency department for evaluation of abdominal pain and malaise.  Patient reports that he has developed severe generalized abdominal pain over the past day, worse with any movement and associated with a loose stool.  Reports chills but has not taken his temperature.  Denies vomiting.  Reports having similar symptoms previously for which she was treated with antibiotics and symptoms had resolved until the acute illness.  He denies chest pain or palpitations, reports cough slightly worse than usual, but denies shortness of breath.  ED Course: Upon arrival to the ED, patient is found to have a temperature of 37.9 C, saturating well on room air, slightly tachycardic, and modestly hypertensive.  EKG features sinus tachycardia with rate 109 and LVH.  Chest x-ray is notable for cardiomegaly and mild interstitial edema.  Chemistry panel reveals a sodium of 127, normal bicarb, and BUN 38.  CBC features and increased leukocytosis, now 14,100, and a chronic stable microcytic anemia with hemoglobin of 9.7.  There is concern for SBP and the patient was treated with Rocephin in the ED.  He was also provided analgesia with fentanyl.  He remains hemodynamically stable, in no apparent respiratory distress, and will be admitted to the medical-surgical unit for ongoing evaluation and management of generalized abdominal pain with distention, increased leukocytosis, chills, and concern for SBP.  Review of Systems:  All other systems reviewed and apart from HPI, are negative.  Past Medical History:    Diagnosis Date  . Adenomatous colon polyp    tubular  . Anemia   . Anxiety   . Arthritis    left shoulder  . Atherosclerosis of aorta ([place])   . Cardiomegaly   . Chest pain    DATE UNKNOWN, C/O PERIODICALLY  . Cocaine abuse ([place])   . COPD exacerbation ([place]) 08/17/2016  . Coronary artery disease    stent 02/22/17  . Dialysis patient ([place])    Monday-Wednesday-Friday  . ESRD (end stage renal disease) on dialysis ([place])    "[name]; MWF" (07/04/2017)  . GERD (gastroesophageal reflux disease)    DATE UNKNOWN  . Hemorrhoids   . Hepatitis B, chronic ([place])   . Hepatitis C   . History of kidney stones   . Hyperkalemia   . Hypertension   . Metabolic bone disease    Patient denies  . Mitral stenosis   . Myocardial infarction ([place])   . Pneumonia   . Pulmonary edema   . Renal disorder   . Renal insufficiency   . Shortness of breath dyspnea    " for the last past year with this dialysis"  . Solitary rectal ulcer syndrome   . Tubular adenoma of colon     Past Surgical History:  Procedure Laterality Date  . A/V FISTULAGRAM Left 05/26/2017   Procedure: A/V FISTULAGRAM;  Surgeon: Conrad , MD;  Location: [facility];  Service: Cardiovascular;  Laterality: Left;  . APPLICATION OF WOUND VAC Left 06/14/2017   Procedure: APPLICATION OF WOUND VAC;  Surgeon: Katha Cabal, MD;  Location: [facility];  Service: Vascular;  Laterality: Left;  . AV FISTULA PLACEMENT  2012   BELIEVED WAS PLACED IN JUNE  . AV FISTULA PLACEMENT Right 08/09/2017   Procedure: Creation Right arm ARTERIOVENOUS BRACHIOCEPOHALIC FISTULA;  Surgeon: Elam Dutch, MD;  Location: [facility];  Service: Vascular;  Laterality: Right;  . COLONOSCOPY    . CORONARY STENT INTERVENTION N/A 02/22/2017   Procedure: CORONARY STENT INTERVENTION;  Surgeon: Nigel Mormon, MD;  Location: [facility];  Service: Cardiovascular;  Laterality: N/A;  . FLEXIBLE SIGMOIDOSCOPY N/A 07/15/2017   Procedure: FLEXIBLE  SIGMOIDOSCOPY;  Surgeon: Carol Ada, MD;  Location: [facility];  Service: Endoscopy;  Laterality: N/A;  . HEMORRHOID BANDING    . I&D EXTREMITY Left 06/01/2017   Procedure: IRRIGATION AND DEBRIDEMENT LEFT ARM HEMATOMA WITH LIGATION OF LEFT ARM AV FISTULA;  Surgeon: Elam Dutch, MD;  Location: [facility];  Service: Vascular;  Laterality: Left;  . I&D EXTREMITY Left 06/14/2017   Procedure: IRRIGATION AND DEBRIDEMENT EXTREMITY;  Surgeon: Katha Cabal, MD;  Location: [facility];  Service: Vascular;  Laterality: Left;  . INSERTION OF DIALYSIS CATHETER  05/30/2017  . INSERTION OF DIALYSIS CATHETER N/A 05/30/2017   Procedure: INSERTION OF DIALYSIS CATHETER;  Surgeon: Elam Dutch, MD;  Location: [facility];  Service: Vascular;  Laterality: N/A;  . IR PARACENTESIS  08/30/2017  . IR PARACENTESIS  09/29/2017  . IR PARACENTESIS  10/28/2017  . LEFT HEART CATH AND CORONARY ANGIOGRAPHY N/A 02/22/2017   Procedure: LEFT HEART CATH AND CORONARY ANGIOGRAPHY;  Surgeon: Nigel Mormon, MD;  Location: [facility];  Service: Cardiovascular;  Laterality: N/A;  . LIGATION OF ARTERIOVENOUS  FISTULA Left 10/16/4257   Procedure: Plication of Left Arm Arteriovenous Fistula;  Surgeon: Elam Dutch, MD;  Location: [facility];  Service: Vascular;  Laterality: Left;  . POLYPECTOMY    . REVISON OF ARTERIOVENOUS FISTULA Left 5/63/8756   Procedure: PLICATION OF DISTAL ANEURYSMAL SEGEMENT OF LEFT UPPER ARM ARTERIOVENOUS FISTULA;  Surgeon: Elam Dutch, MD;  Location: [facility];  Service: Vascular;  Laterality: Left;  . REVISON OF ARTERIOVENOUS FISTULA Left 4/33/2951   Procedure: Plication of Left Upper Arm Fistula ;  Surgeon: Waynetta Sandy, MD;  Location: [facility];  Service: Vascular;  Laterality: Left;  . SKIN GRAFT SPLIT THICKNESS LEG / FOOT Left    SKIN GRAFT SPLIT THICKNESS LEFT ARM DONOR SITE: LEFT ANTERIOR THIGH  . SKIN SPLIT GRAFT Left 07/04/2017   Procedure: SKIN GRAFT SPLIT THICKNESS LEFT ARM  DONOR SITE: LEFT ANTERIOR THIGH;  Surgeon: Elam Dutch, MD;  Location: [facility];  Service: Vascular;  Laterality: Left;  . THROMBECTOMY W/ EMBOLECTOMY Left 06/05/2017   Procedure: EXPLORATION OF LEFT ARM FOR BLEEDING; OVERSEWED PROXIMAL FISTULA;  Surgeon: Angelia Mould, MD;  Location: [facility];  Service: Vascular;  Laterality: Left;  . WOUND EXPLORATION Left 06/03/2017   Procedure: WOUND EXPLORATION WITH WOUND VAC APPLICATION TO LEFT ARM;  Surgeon: Angelia Mould, MD;  Location: [facility];  Service: Vascular;  Laterality: Left;     reports that he has been smoking cigarettes.  He started smoking about 44 years ago. He has a 21.50 pack-year smoking history. He has never used smokeless tobacco. He reports that he drank alcohol. He reports that he has current or past drug history.  Allergies  Allergen Reactions  . Aspirin Other (See Comments)    STOMACH PAIN  . Tylenol [Acetaminophen]     Stomach ache  . Clonidine Derivatives Itching  . Tramadol Itching    Family History  Problem Relation Age of Onset  . Heart disease Mother   . Lung cancer Mother   . Heart disease Father   . Malignant hyperthermia Father   . COPD Father   . Throat cancer Sister   . Esophageal cancer Sister   . Hypertension Other   . COPD Other   . Colon cancer Neg Hx   . Colon polyps Neg Hx   . Rectal cancer Neg Hx   . Stomach cancer Neg Hx      Prior to Admission medications   Medication Sig Start Date End Date Taking? Authorizing Provider  ALPRAZolam (XANAX) 0.25 MG tablet Take 1 tablet (0.25 mg total) by mouth 3 (three) times daily as needed for anxiety or sleep. 10/30/17  Yes Eugenie Filler, MD  albuterol (VENTOLIN HFA) 108 (90 Base) MCG/ACT inhaler Inhale 2 puffs into the lungs every 6 (six) hours as needed for wheezing or shortness of breath.    [provider]  budesonide-formoterol (SYMBICORT) 80-4.5 MCG/ACT inhaler Inhale 2 puffs into the lungs 2 (two) times daily. 10/13/17    Tanda Rockers, MD  cefTAZidime (FORTAZ) 1 g SOLR injection Inject 1 g into the vein every Monday, Wednesday, and Friday. In HD x 1 week. 10/31/17   Eugenie Filler, MD  clopidogrel (PLAVIX) 75 MG tablet Take 1 tablet (75 mg total) by mouth daily. 07/15/17   Rhyne, Hulen Shouts, PA-C  diltiazem (CARDIZEM CD) 120 MG 24 hr capsule Take 1 capsule (120 mg total) by mouth daily. 06/23/17 06/23/18  Deatra James, MD  famotidine (PEPCID) 20 MG tablet One at bedtime Patient taking differently: Take 20 mg by mouth at bedtime.  09/08/17   Tanda Rockers, MD  fluticasone (FLONASE) 50 MCG/ACT nasal spray Place 2 sprays into both nostrils daily. 10/31/17   Eugenie Filler, MD  gabapentin (NEURONTIN) 100 MG capsule Take 1 capsule (100 mg total) by mouth at bedtime. Patient taking differently: Take 100 mg by mouth 2 (two) times daily.  09/30/17   Mariel Aloe, MD  guaiFENesin (ROBITUSSIN) 100 MG/5ML SOLN Take 5 mLs (100 mg total) by mouth every 4 (four) hours as needed for cough or to loosen phlegm. 10/30/17   Eugenie Filler, MD  hydrALAZINE (APRESOLINE) 100 MG tablet Take 1 tablet by mouth twice a day take after HD on dialysis days 08/31/17   [provider]  isosorbide mononitrate (IMDUR) 30 MG 24 hr tablet Take 1 tablet (30 mg total) by mouth daily. 06/17/17   [name], MD  loratadine (CLARITIN) 10 MG tablet Take 1 tablet (10 mg total) by mouth daily. 10/31/17   Eugenie Filler, MD  nicotine (NICODERM CQ - DOSED IN MG/24 HOURS) 21 mg/24hr patch Place 21 mg onto the skin daily.    [provider]  Nutritional Supplements (FEEDING SUPPLEMENT, NEPRO CARB STEADY,) LIQD Take 237 mLs by mouth 2 (two) times daily between meals. 09/30/17   Mariel Aloe, MD  oxyCODONE (OXY IR/ROXICODONE) 5 MG immediate release tablet Take 5 mg by mouth 2 (two) times daily as needed for moderate pain.  09/22/17   [provider]  pantoprazole (PROTONIX) 40 MG tablet Take 1 tablet (40 mg total) by  mouth daily. Take 30-60 min before first meal of the day 09/08/17   Tanda Rockers, MD  SPIRIVA HANDIHALER 18 MCG inhalation capsule Place 1 puff into inhaler and inhale daily. 10/04/17   [provider]    Physical Exam: Vitals:  11/04/17 0045 11/04/17 0130 11/04/17 0251 11/04/17 0315  BP: (!) 137/123 134/87 (!) 186/89 (!) 175/88  Pulse: (!) 108  (!) 105   Resp: (!) 23 (!) 21 19 20   SpO2: 96%  97%   Weight:          Constitutional: NAD, calm, appears uncomfortable  Eyes: PERTLA, lids and conjunctivae normal ENMT: Mucous membranes are moist. Posterior pharynx clear of any exudate or lesions.   Neck: normal, supple, no masses, no thyromegaly Respiratory: clear to auscultation bilaterally, no wheezing, no crackles. Normal respiratory effort.   Cardiovascular: Rate ~110 and regular. No extremity edema.   Abdomen: Distended, soft, generalized tenderness without rebound pain or guarding. Bowel sounds active.  Musculoskeletal: no clubbing / cyanosis. No joint deformity upper and lower extremities.   Skin: no significant rashes, lesions, ulcers. Warm, dry, well-perfused. Neurologic: No facial asymmetry. Sensation intact. Strength 5/5 in all 4 limbs.  Psychiatric: Alert and oriented x 3. Calm, cooperative.     Labs on Admission: I have personally reviewed following labs and imaging studies  CBC: Recent Labs  Lab 10/30/17 0338 11/03/17 1102 11/04/17 0148  WBC 12.6* 13.5* 14.1*  NEUTROABS  --   --  11.6*  HGB 10.4* 10.7* 9.7*  HCT 30.9* 31.4* 28.9*  MCV 75.9* 76.6* 76.7*  PLT 212 192 132   Basic Metabolic Panel: Recent Labs  Lab 10/30/17 0338 11/03/17 1102 11/04/17 0148  NA 131* 128* 127*  K 4.8 4.8 5.1  CL 89* 90* 86*  CO2 27 25 24   GLUCOSE 213* 93 101*  BUN 40* 29* 38*  CREATININE 6.44* 4.94* 5.92*  CALCIUM 9.2 8.7* 8.9   GFR: Estimated Creatinine Clearance: 14.1 mL/min (A) (by C-G formula based on SCr of 5.92 mg/dL (H)). Liver Function Tests: Recent  Labs  Lab 11/03/17 1102  AST 47*  ALT 48  ALKPHOS 194*  BILITOT 1.0  PROT 6.7  ALBUMIN 3.1*   Recent Labs  Lab 11/03/17 1102  LIPASE 39   No results for input(s): AMMONIA in the last 168 hours. Coagulation Profile: No results for input(s): INR, PROTIME in the last 168 hours. Cardiac Enzymes: No results for input(s): CKTOTAL, CKMB, CKMBINDEX, TROPONINI in the last 168 hours. BNP (last 3 results) No results for input(s): PROBNP in the last 8760 hours. HbA1C: No results for input(s): HGBA1C in the last 72 hours. CBG: No results for input(s): GLUCAP in the last 168 hours. Lipid Profile: No results for input(s): CHOL, HDL, LDLCALC, TRIG, CHOLHDL, LDLDIRECT in the last 72 hours. Thyroid Function Tests: No results for input(s): TSH, T4TOTAL, FREET4, T3FREE, THYROIDAB in the last 72 hours. Anemia Panel: No results for input(s): VITAMINB12, FOLATE, FERRITIN, TIBC, IRON, RETICCTPCT in the last 72 hours. Urine analysis:    Component Value Date/Time   COLORURINE YELLOW 07/16/2011 1619   APPEARANCEUR CLEAR 07/16/2011 1619   LABSPEC 1.018 07/16/2011 1619   PHURINE 7.5 07/16/2011 1619   GLUCOSEU 250 (A) 07/16/2011 1619   HGBUR MODERATE (A) 07/16/2011 1619   BILIRUBINUR SMALL (A) 07/16/2011 1619   KETONESUR NEGATIVE 07/16/2011 1619   PROTEINUR >300 (A) 07/16/2011 1619   UROBILINOGEN 1.0 07/16/2011 1619   NITRITE NEGATIVE 07/16/2011 1619   LEUKOCYTESUR SMALL (A) 07/16/2011 1619   Sepsis Labs: @LABRCNTIP (procalcitonin:4,lacticidven:4) ) Recent Results (from the past 240 hour(s))  Body fluid culture     Status: None   Collection Time: 10/28/17 11:45 AM  Result Value Ref Range Status   Specimen Description FLUID PERITONEAL CAVITY  Final  Special Requests NONE  Final   Gram Stain   Final    FEW WBC PRESENT,BOTH PMN AND MONONUCLEAR NO ORGANISMS SEEN    Culture   Final    NO GROWTH 3 DAYS Performed at [facility], [address] [place], [place] [address]    Report  Status 10/31/2017 FINAL  Final     Radiological Exams on Admission: Dg Chest Port 1 View  Result Date: 11/04/2017 CLINICAL DATA:  Productive cough with generalized weakness and dyspnea. EXAM: PORTABLE CHEST 1 VIEW COMPARISON:  10/29/2017 FINDINGS: Stable cardiomegaly with aortic atherosclerosis. Mild interstitial edema. No pulmonary consolidation. Left basilar atelectasis is identified with slight elevation of the left hemidiaphragm, chronic in appearance. Right IJ dialysis catheter tip remains in the mid right atrium. No acute osseous abnormality. IMPRESSION: Cardiomegaly with mild interstitial edema. Aortic atherosclerosis. Dialysis catheter tip in the mid right atrium. Electronically Signed   By: Ashley Royalty M.D.   On: 11/04/2017 03:23    EKG: Independently reviewed. Sinus tachycardia (rate 109), LVH.   Assessment/Plan   1. Suspected SBP  - Presents with generalized abdominal pain and fevers, found to have increased leukocytosis and abdominal distension  - Treated with empiric Rocephin in ED  - Culture blood, continue Rocephin, give 1.5 g/kg albumin now, IR consulted for paracentesis    2. Cirrhosis with ascites  - Hx of hep C, hep B, and alcoholism  - Has ascites, requiring paracentesis ~monthly  - Plan for paracentesis as above  - Continue PPI, monitor lytes   3. ESRD  - On HD MWF, last dialyzed on 11/02/17  - No hyperkalemia, acidosis, uremia, or respiratory issues on admission  - SLIV, fluid-restricted renal diet    4. Hyponatremia  - Serum sodium is 127 on admission  - Likely secondary to cirrhosis  - Follow chemistries   5. Anemia of chronic disease  - Hgb is stable on admission at 9.7  - No bleeding, likely secondary to ESRD/chronic disease    6. Alcohol abuse  - No signs of withdrawal on admission  - Monitor with CIWA and prn Ativan     DVT prophylaxis: sq heparin  Code Status: Full  Family Communication: Discussed with patient Consults called: None Admission  status: Inpatient    Vianne Bulls, MD [facility] Pager [phone]  If 7PM-7AM, please contact night-coverage [email or web address] Password [facility]  11/04/2017, 4:53 AM

## 2017-11-04 NOTE — ED Notes (Signed)
Renal Diet w/ Restriction Fluid was ordered for Lunch.

## 2017-11-04 NOTE — ED Notes (Signed)
Patient transported to IR 

## 2017-11-04 NOTE — ED Notes (Signed)
[name] [phone] Son who is POA

## 2017-11-04 NOTE — ED Notes (Signed)
Renal diet with fluid restriction 1200 mL dinner tray ordered

## 2017-11-05 LAB — PROTIME-INR
INR: 1.32
Prothrombin Time: 16.3 seconds — ABNORMAL HIGH (ref 11.4–15.2)

## 2017-11-05 LAB — COMPREHENSIVE METABOLIC PANEL
ALT: 27 U/L (ref 17–63)
AST: 22 U/L (ref 15–41)
Albumin: 3.2 g/dL — ABNORMAL LOW (ref 3.5–5.0)
Alkaline Phosphatase: 180 U/L — ABNORMAL HIGH (ref 38–126)
Anion gap: 14 (ref 5–15)
BUN: 60 mg/dL — ABNORMAL HIGH (ref 6–20)
CO2: 22 mmol/L (ref 22–32)
Calcium: 9 mg/dL (ref 8.9–10.3)
Chloride: 89 mmol/L — ABNORMAL LOW (ref 101–111)
Creatinine, Ser: 7.72 mg/dL — ABNORMAL HIGH (ref 0.61–1.24)
GFR calc Af Amer: 8 mL/min — ABNORMAL LOW (ref >60)
GFR calc non Af Amer: 7 mL/min — ABNORMAL LOW (ref >60)
Glucose, Bld: 79 mg/dL (ref 65–99)
Potassium: 5.3 mmol/L — ABNORMAL HIGH (ref 3.5–5.1)
Sodium: 125 mmol/L — ABNORMAL LOW (ref 135–145)
Total Bilirubin: 1.3 mg/dL — ABNORMAL HIGH (ref 0.3–1.2)
Total Protein: 6.8 g/dL (ref 6.5–8.1)

## 2017-11-05 LAB — CBC WITH DIFFERENTIAL/PLATELET
Basophils Absolute: 0 10*3/uL (ref 0.0–0.1)
Basophils Relative: 0 %
Eosinophils Absolute: 0.1 10*3/uL (ref 0.0–0.7)
Eosinophils Relative: 1 %
HCT: 27.9 % — ABNORMAL LOW (ref 39.0–52.0)
Hemoglobin: 9.4 g/dL — ABNORMAL LOW (ref 13.0–17.0)
Lymphocytes Relative: 15 %
Lymphs Abs: 1.9 10*3/uL (ref 0.7–4.0)
MCH: 25.3 pg — ABNORMAL LOW (ref 26.0–34.0)
MCHC: 33.7 g/dL (ref 30.0–36.0)
MCV: 75 fL — ABNORMAL LOW (ref 78.0–100.0)
Monocytes Absolute: 1.2 10*3/uL — ABNORMAL HIGH (ref 0.1–1.0)
Monocytes Relative: 9 %
Neutro Abs: 9.7 10*3/uL — ABNORMAL HIGH (ref 1.7–7.7)
Neutrophils Relative %: 75 %
Platelets: 156 10*3/uL (ref 150–400)
RBC: 3.72 MIL/uL — ABNORMAL LOW (ref 4.22–5.81)
RDW: 21.6 % — ABNORMAL HIGH (ref 11.5–15.5)
WBC: 12.9 10*3/uL — ABNORMAL HIGH (ref 4.0–10.5)

## 2017-11-05 LAB — GLUCOSE, CAPILLARY
Glucose-Capillary: 81 mg/dL (ref 65–99)
Glucose-Capillary: 137 mg/dL — ABNORMAL HIGH (ref 65–99)

## 2017-11-05 LAB — MAGNESIUM: Magnesium: 2.4 mg/dL (ref 1.7–2.4)

## 2017-11-05 LAB — MRSA PCR SCREENING: MRSA by PCR: NEGATIVE

## 2017-11-05 MED ORDER — LIDOCAINE HCL (PF) 1 % IJ SOLN: 5.0000 mL | INTRAMUSCULAR | Status: DC | PRN

## 2017-11-05 MED ORDER — SODIUM CHLORIDE 0.9 % IV SOLN: 100.0000 mL | INTRAVENOUS | Status: DC | PRN

## 2017-11-05 MED ORDER — SODIUM CHLORIDE 0.9% FLUSH
10.0000 mL | Freq: Two times a day (BID) | INTRAVENOUS | Status: DC
  Administered 2017-11-06 – 2017-11-11 (×5): 10 mL

## 2017-11-05 MED ORDER — ALTEPLASE 2 MG IJ SOLR: 2.0000 mg | Freq: Once | INTRAMUSCULAR | Status: DC | PRN

## 2017-11-05 MED ORDER — HEPARIN SODIUM (PORCINE) 1000 UNIT/ML DIALYSIS: 1000.0000 [IU] | INTRAMUSCULAR | Status: DC | PRN

## 2017-11-05 MED ORDER — PENTAFLUOROPROP-TETRAFLUOROETH EX AERO: 1.0000 "application " | INHALATION_SPRAY | CUTANEOUS | Status: DC | PRN

## 2017-11-05 MED ORDER — HYDROMORPHONE HCL 1 MG/ML IJ SOLN
INTRAMUSCULAR | Status: AC
  Administered 2017-11-05: 1 mg via INTRAVENOUS
  Filled 2017-11-05: qty 1

## 2017-11-05 MED ORDER — LIDOCAINE-PRILOCAINE 2.5-2.5 % EX CREA: 1.0000 "application " | TOPICAL_CREAM | CUTANEOUS | Status: DC | PRN

## 2017-11-05 MED ORDER — SODIUM CHLORIDE 0.9 % IV SOLN
2.0000 g | INTRAVENOUS | Status: DC
  Administered 2017-11-06: 2 g via INTRAVENOUS
  Filled 2017-11-05 (×2): qty 20

## 2017-11-05 MED ORDER — SODIUM CHLORIDE 0.9% FLUSH: 10.0000 mL | INTRAVENOUS | Status: DC | PRN

## 2017-11-05 NOTE — Progress Notes (Signed)
Assessment/Plan: 1. Presumed SBP on IV Rocephin  2  Cirrhosis/Hep B/HepC/Recurrent ascites. Paracentesis 4/19 during previous admit- Fluid cx neg  3 .ESRD - MWF, for HD today off schedule.  Subjective: Interval History: Abd less tender  Objective: Vital signs in last 24 hours: Temp:  [99.3 F (37.4 C)] 99.3 F (37.4 C) (04/26 1922) Pulse Rate:  [92-110] 109 (04/27 0934) Resp:  [20] 20 (04/27 0617) BP: (96-186)/(65-102) 105/77 (04/27 0930) SpO2:  [94 %-96 %] 96 % (04/27 0617) Weight:  [69.5 kg (153 lb 3.5 oz)] 69.5 kg (153 lb 3.5 oz) (04/27 0500) Weight change: -3.075 kg (-6 lb 12.5 oz)  Intake/Output from previous day: 04/26 0701 - 04/27 0700 In: 103 [NG/GT:3; IV Piggyback:100] Out: -  Intake/Output this shift: No intake/output data recorded.  General appearance: alert and cooperative GI: protuberant and mild tenderness  Lab Results: Recent Labs    11/04/17 0148 11/05/17 0651  WBC 14.1* 12.9*  HGB 9.7* 9.4*  HCT 28.9* 27.9*  PLT 158 156   BMET:  Recent Labs    11/04/17 1727 11/05/17 0651  NA 129* 125*  K 5.4* 5.3*  CL 91* 89*  CO2 23 22  GLUCOSE 120* 79  BUN 50* 60*  CREATININE 6.87* 7.72*  CALCIUM 8.9 9.0   No results for input(s): PTH in the last 72 hours. Iron Studies: No results for input(s): IRON, TIBC, TRANSFERRIN, FERRITIN in the last 72 hours. Studies/Results: Dg Chest Port 1 View  Result Date: 11/04/2017 CLINICAL DATA:  Productive cough with generalized weakness and dyspnea. EXAM: PORTABLE CHEST 1 VIEW COMPARISON:  10/29/2017 FINDINGS: Stable cardiomegaly with aortic atherosclerosis. Mild interstitial edema. No pulmonary consolidation. Left basilar atelectasis is identified with slight elevation of the left hemidiaphragm, chronic in appearance. Right IJ dialysis catheter tip remains in the mid right atrium. No acute osseous abnormality. IMPRESSION: Cardiomegaly with mild interstitial edema. Aortic atherosclerosis. Dialysis catheter tip in the mid  right atrium. Electronically Signed   By: Ashley Royalty M.D.   On: 11/04/2017 03:23   Ir Abdomen US Limited  Result Date: 11/04/2017 CLINICAL DATA:  Abdominal pain, recurrent abdominal ascites. Paracentesis requested. EXAM: LIMITED ABDOMEN ULTRASOUND FOR ASCITES TECHNIQUE: Limited ultrasound survey for ascites was performed in all four abdominal quadrants. COMPARISON:  10/28/2017 FINDINGS: Survey ultrasound demonstrates a small amount of scattered abdominal ascites. No large pocket to allow safe therapeutic paracentesis. IMPRESSION: Small amount of scattered abdominal ascites. Therapeutic paracentesis deferred. Electronically Signed   By: Lucrezia Europe M.D.   On: 11/04/2017 09:44    Scheduled: . clopidogrel  75 mg Oral Daily  . diltiazem  120 mg Oral Daily  . famotidine  20 mg Oral QHS  . feeding supplement (NEPRO CARB STEADY)  237 mL Oral BID BM  . folic acid  1 mg Oral Daily  . gabapentin  100 mg Oral BID  . heparin  5,000 Units Subcutaneous Q8H  . hydrALAZINE  100 mg Oral BID  . isosorbide mononitrate  30 mg Oral Daily  . LORazepam  0-4 mg Oral Q6H   Followed by  . [START ON 11/06/2017] LORazepam  0-4 mg Oral Q12H  . mometasone-formoterol  2 puff Inhalation BID  . multivitamin with minerals  1 tablet Oral Daily  . nicotine  21 mg Transdermal Daily  . pantoprazole  40 mg Oral Daily  . sodium chloride flush  3 mL Intravenous Q12H  . thiamine  100 mg Oral Daily   Or  . thiamine  100 mg Intravenous Daily  .  tiotropium  18 mcg Inhalation Daily  . vancomycin  125 mg Oral QID    LOS: 1 day   Estanislado Emms 11/05/2017,10:33 AM

## 2017-11-05 NOTE — Progress Notes (Signed)
PROGRESS NOTE    [name]  PFX:[id number] DOB: [birth year] DOA: 11/04/2017 PCP: [name], MD    Brief Narrative: 57 year old man with past medical history relevant for ESRD complicated by anemia, alcoholism, chronic hepatitis B and C complicated by cirrhosis and ascites, COPD, chronic diastolic heart failure by echo on 08/30/2017 with dilated RV, coronary artery disease status post proximal RCA stent placement on 02/22/2017, WHO group 2 severe pulmonary hypertension with very dilated RV by echo paroxysmal atrial fibrillation, recently admitted for abdominal pain and discharged with a diagnosis of spontaneous bacterial peritonitis with negative cultures coming in with recurrent and persistent lower abdominal pain.   Assessment & Plan:   Principal Problem:   SBP (spontaneous bacterial peritonitis) ([place]): PRESUMED. Active Problems:   Alcohol abuse   Chronic hepatitis B (HCC)   Chronic hepatitis C without hepatic coma (HCC)   COPD (chronic obstructive pulmonary disease) (HCC)   Anxiety   Anemia due to chronic kidney disease   ESRD (end stage renal disease) (HCC)   Other cirrhosis of liver (HCC)   Abdominal pain   Hyponatremia   #) Recurrent abdominal pain/diarrhea: Likely secondary to C. difficile colitis.  Patient did not have a pocket to tap for ruling out spontaneous bacterial peritonitis however he had been maintained on outpatient cefepime on dialysis days for culture-negative SBP diagnosed during his prior hospitalization on 10/28/2017. -Continue ceftriaxone, C. difficile positive -Continue oral vancomycin 125 mg every 6 hours, he will need at least several days of treatment after completing the IV antibiotics for his SBP. -Pain control as tolerated, we will have to discuss goals of care with patient  #) Cirrhosis due to alcohol, hepatitis B, hepatitis C: Currently patient does not have any ascites.  His meld score is 30 which yields a greater than 50% mortality  over 3 months. - We will discuss goals of care with patient and family  #) End-stage renal disease: -Nephrology consult -Continue Monday Wednesday Friday dialysis  #) Hypertension: - Continue Isorbid mononitrate 30 mg daily -Continue hydralazine 100 mg Monday Wednesday Friday  #) Coronary artery disease: -Continue clopidogrel 75 mg daily, patient has aspirin allergy - Unclear why patient is not on beta-blocker  #) paroxysmal atrial fibrillation: -Continue diltiazem 120 mg daily -Patient is not on anticoagulation due to prior GI bleed  #) Anemia: Secondary to chronic kidney disease -Stable  #) Hyponatremia: Mild and chronic.  Likely due to cirrhosis.  #) COPD: -Continue tiotropium -Continue ICS/LABA -PRN albuterol  #) Pain/psych: -Continue gabapentin 100 mg twice daily after hemodialysis -Continue as needed alprazolam  Fluids: Restricted Electrolytes: Monitor and support Nutrition: Renal diet with fluid restriction  Prophylaxis: Heparin  Disposition: Pending control of abdominal pain  Full code   Consultants:   Neurology  Procedures: (Don't include imaging studies which can be auto populated. Include things that cannot be auto populated i.e. Echo, Carotid and venous dopplers, Foley, Bipap, HD, tubes/drains, wound vac, central lines etc)  None  Antimicrobials: (specify start and planned stop date. Auto populated tables are space occupying and do not give end dates)  IV ceftriaxone started 11/03/2017   Subjective: Patient ports that his pain is somewhat improved.  He continues to have fairly severe suprapubic abdominal pain.  His bowel movements continue to be fairly loose however they have improved.  He denies any nausea, vomiting, chest pain, cough, fevers.  Objective: Vitals:   11/05/17 0500 11/05/17 0617 11/05/17 0930 11/05/17 0934  BP:  96/65 105/77   Pulse:  92  (!)  109  Resp:  20    Temp:      TempSrc:      SpO2:  96%    Weight: 69.5 kg (153 lb  3.5 oz)     Height:        Intake/Output Summary (Last 24 hours) at 11/05/2017 1408 Last data filed at 11/05/2017 0624 Gross per 24 hour  Intake 3 ml  Output -  Net 3 ml   Filed Weights   11/04/17 0041 11/05/17 0500  Weight: 72.6 kg (160 lb) 69.5 kg (153 lb 3.5 oz)    Examination:  General exam: Comfortable appearing Respiratory system: Clear to auscultation. Respiratory effort normal. Cardiovascular system: Regular rate and rhythm, no murmurs Gastrointestinal system: Soft, significant suprapubic tenderness to light palpation, plus bowel sounds, no rebound or guarding Central nervous system: Alert and oriented. No focal neurological deficits. Extremities: Noted to have numerous grafts and fistulas over upper extremities with no bruit or thrill Skin: Right upper chest PermCath noted to be clean dry intact Psychiatry: Judgement and insight appear normal. Mood & affect appropriate.     Data Reviewed: I have personally reviewed following labs and imaging studies  CBC: Recent Labs  Lab 10/30/17 0338 11/03/17 1102 11/04/17 0148 11/05/17 0651  WBC 12.6* 13.5* 14.1* 12.9*  NEUTROABS  --   --  11.6* 9.7*  HGB 10.4* 10.7* 9.7* 9.4*  HCT 30.9* 31.4* 28.9* 27.9*  MCV 75.9* 76.6* 76.7* 75.0*  PLT 212 192 158 332   Basic Metabolic Panel: Recent Labs  Lab 10/30/17 0338 11/03/17 1102 11/04/17 0148 11/04/17 1727 11/05/17 0651  NA 131* 128* 127* 129* 125*  K 4.8 4.8 5.1 5.4* 5.3*  CL 89* 90* 86* 91* 89*  CO2 27 25 24 23 22   GLUCOSE 213* 93 101* 120* 79  BUN 40* 29* 38* 50* 60*  CREATININE 6.44* 4.94* 5.92* 6.87* 7.72*  CALCIUM 9.2 8.7* 8.9 8.9 9.0  MG  --   --   --   --  2.4   GFR: Estimated Creatinine Clearance: 10.6 mL/min (A) (by C-G formula based on SCr of 7.72 mg/dL (H)). Liver Function Tests: Recent Labs  Lab 11/03/17 1102 11/04/17 1727 11/05/17 0651  AST 47* 28 22  ALT 48 32 27  ALKPHOS 194* 181* 180*  BILITOT 1.0 1.3* 1.3*  PROT 6.7 7.0 6.8  ALBUMIN  3.1* 3.3* 3.2*   Recent Labs  Lab 11/03/17 1102  LIPASE 39   No results for input(s): AMMONIA in the last 168 hours. Coagulation Profile: Recent Labs  Lab 11/05/17 0651  INR 1.32   Cardiac Enzymes: No results for input(s): CKTOTAL, CKMB, CKMBINDEX, TROPONINI in the last 168 hours. BNP (last 3 results) No results for input(s): PROBNP in the last 8760 hours. HbA1C: No results for input(s): HGBA1C in the last 72 hours. CBG: Recent Labs  Lab 11/04/17 0824 11/05/17 0836  GLUCAP 85 81   Lipid Profile: No results for input(s): CHOL, HDL, LDLCALC, TRIG, CHOLHDL, LDLDIRECT in the last 72 hours. Thyroid Function Tests: No results for input(s): TSH, T4TOTAL, FREET4, T3FREE, THYROIDAB in the last 72 hours. Anemia Panel: No results for input(s): VITAMINB12, FOLATE, FERRITIN, TIBC, IRON, RETICCTPCT in the last 72 hours. Sepsis Labs: No results for input(s): PROCALCITON, LATICACIDVEN in the last 168 hours.  Recent Results (from the past 240 hour(s))  Body fluid culture     Status: None   Collection Time: 10/28/17 11:45 AM  Result Value Ref Range Status   Specimen Description FLUID PERITONEAL CAVITY  Final   Special Requests NONE  Final   Gram Stain   Final    FEW WBC PRESENT,BOTH PMN AND MONONUCLEAR NO ORGANISMS SEEN    Culture   Final    NO GROWTH 3 DAYS Performed at [facility], [address] [place], [place] [address]    Report Status 10/31/2017 FINAL  Final  Culture, blood (routine x 2)     Status: None (Preliminary result)   Collection Time: 11/04/17  8:20 AM  Result Value Ref Range Status   Specimen Description BLOOD RIGHT ARM  Final   Special Requests   Final    BOTTLES DRAWN AEROBIC ONLY Blood Culture adequate volume   Culture   Final    NO GROWTH 1 DAY Performed at [facility], [place] [address] [place], [place] [address]    Report Status PENDING  Incomplete  Culture, blood (routine x 2)     Status: None (Preliminary result)   Collection Time:  11/04/17  8:29 AM  Result Value Ref Range Status   Specimen Description BLOOD RIGHT HAND  Final   Special Requests   Final    BOTTLES DRAWN AEROBIC AND ANAEROBIC Blood Culture adequate volume   Culture   Final    NO GROWTH 1 DAY Performed at [facility], [place] [address], [place], [place] [address]    Report Status PENDING  Incomplete  C difficile quick scan w PCR reflex     Status: Abnormal   Collection Time: 11/04/17  2:53 PM  Result Value Ref Range Status   C Diff antigen POSITIVE (A) NEGATIVE Final   C Diff toxin NEGATIVE NEGATIVE Final   C Diff interpretation Results are indeterminate. See PCR results.  Final  C. Diff by PCR, Reflexed     Status: Abnormal   Collection Time: 11/04/17  2:53 PM  Result Value Ref Range Status   Toxigenic C. Difficile by PCR POSITIVE (A) NEGATIVE Final    Comment: Positive for toxigenic C. difficile with little to no toxin production. Only treat if clinical presentation suggests symptomatic illness. Performed at [facility], [place] [address], [place], [place] [address]   MRSA PCR Screening     Status: None   Collection Time: 11/04/17  8:20 PM  Result Value Ref Range Status   MRSA by PCR NEGATIVE NEGATIVE Final    Comment:        The GeneXpert MRSA Assay (FDA approved for NASAL specimens only), is one component of a comprehensive MRSA colonization surveillance program. It is not intended to diagnose MRSA infection nor to guide or monitor treatment for MRSA infections. Performed at [facility], [place] [address] [place], [place] [address]          Radiology Studies: Dg Chest Port 1 View  Result Date: 11/04/2017 CLINICAL DATA:  Productive cough with generalized weakness and dyspnea. EXAM: PORTABLE CHEST 1 VIEW COMPARISON:  10/29/2017 FINDINGS: Stable cardiomegaly with aortic atherosclerosis. Mild interstitial edema. No pulmonary consolidation. Left basilar atelectasis is identified with slight elevation of the left  hemidiaphragm, chronic in appearance. Right IJ dialysis catheter tip remains in the mid right atrium. No acute osseous abnormality. IMPRESSION: Cardiomegaly with mild interstitial edema. Aortic atherosclerosis. Dialysis catheter tip in the mid right atrium. Electronically Signed   By: Ashley Royalty M.D.   On: 11/04/2017 03:23   Ir Abdomen US Limited  Result Date: 11/04/2017 CLINICAL DATA:  Abdominal pain, recurrent abdominal ascites. Paracentesis requested. EXAM:  LIMITED ABDOMEN ULTRASOUND FOR ASCITES TECHNIQUE: Limited ultrasound survey for ascites was performed in all four abdominal quadrants. COMPARISON:  10/28/2017 FINDINGS: Survey ultrasound demonstrates a small amount of scattered abdominal ascites. No large pocket to allow safe therapeutic paracentesis. IMPRESSION: Small amount of scattered abdominal ascites. Therapeutic paracentesis deferred. Electronically Signed   By: Lucrezia Europe M.D.   On: 11/04/2017 09:44        Scheduled Meds: . clopidogrel  75 mg Oral Daily  . diltiazem  120 mg Oral Daily  . famotidine  20 mg Oral QHS  . feeding supplement (NEPRO CARB STEADY)  237 mL Oral BID BM  . folic acid  1 mg Oral Daily  . gabapentin  100 mg Oral BID  . heparin  5,000 Units Subcutaneous Q8H  . hydrALAZINE  100 mg Oral BID  . isosorbide mononitrate  30 mg Oral Daily  . LORazepam  0-4 mg Oral Q6H   Followed by  . [START ON 11/06/2017] LORazepam  0-4 mg Oral Q12H  . mometasone-formoterol  2 puff Inhalation BID  . multivitamin with minerals  1 tablet Oral Daily  . nicotine  21 mg Transdermal Daily  . pantoprazole  40 mg Oral Daily  . sodium chloride flush  3 mL Intravenous Q12H  . thiamine  100 mg Oral Daily   Or  . thiamine  100 mg Intravenous Daily  . tiotropium  18 mcg Inhalation Daily  . vancomycin  125 mg Oral QID   Continuous Infusions: . sodium chloride    . cefTRIAXone (ROCEPHIN)  IV Stopped (11/05/17 1033)     LOS: 1 day    Time spent: [place],  MD [facility]  If 7PM-7AM, please contact night-coverage [email or web address] Password [facility] 11/05/2017, 2:08 PM

## 2017-11-05 NOTE — Progress Notes (Signed)
Patient requesting dressing on HD catheter be changed.  This RN advised against it, as it was changed at start of treatment and too frequent dressing changes can result in skin tears with increased risk of infection to patient.    Patient requested dressing change kit.  This RN stated that he could not supply patient with dressing change kit.  Patient expressed his displeasure at the answer given.  Will continue to monitor.

## 2017-11-05 NOTE — Progress Notes (Signed)
Dialysis treatment terminated per patient with 50 minutes remaining.  Patient did not give reason for choosing to end treatment early.  AMA form signed and placed in chart, Nephrology made aware.  3500 mL ultrafiltrated.  3000 mL net fluid removal.  Patient status unchanged. Lung sounds diminished to ausculation in all fields. Generalized edema. Cardiac: ST.  Cleansed RIJ catheter with chlorhexidine.  Disconnected lines and flushed ports with saline per protocol.  Ports locked with heparin and capped per protocol.    Report given to Charge, Architect.

## 2017-11-05 NOTE — Progress Notes (Addendum)
Report given to [place], HD RN. All questions answered. Pt transported to HD.

## 2017-11-05 NOTE — Progress Notes (Signed)
Patient arrived to unit by bed.  Reviewed treatment plan and this RN agrees with plan.  Report received from bedside RN, Ellard Artis.  Consent obtained.  Patient A & O X 4, refusing to answer some assessment questions at times.   Lung sounds diminished with rhonchi to ausculation in all fields.  Breathing heavily labored upon arrival to unit.  SpO2 reading 98%.  Pt placed on 2L [place]. Generalized +1pitting edema. Cardiac:  ST.  Removed caps and cleansed RIJ catheter with chlorhedxidine.  Aspirated ports of heparin and flushed them with saline per protocol.  Connected and secured lines, initiated treatment at 1520.  UF Goal of 4500 mL and net fluid removal 4 L.  RIJ dressing changed with bio-patch per policy.  Will continue to monitor.

## 2017-11-06 LAB — CBC
HCT: 26.2 % — ABNORMAL LOW (ref 39.0–52.0)
Hemoglobin: 8.9 g/dL — ABNORMAL LOW (ref 13.0–17.0)
MCH: 25.7 pg — ABNORMAL LOW (ref 26.0–34.0)
MCHC: 34 g/dL (ref 30.0–36.0)
MCV: 75.7 fL — ABNORMAL LOW (ref 78.0–100.0)
Platelets: 130 10*3/uL — ABNORMAL LOW (ref 150–400)
RBC: 3.46 MIL/uL — ABNORMAL LOW (ref 4.22–5.81)
RDW: 21.6 % — ABNORMAL HIGH (ref 11.5–15.5)
WBC: 11.1 10*3/uL — ABNORMAL HIGH (ref 4.0–10.5)

## 2017-11-06 LAB — BASIC METABOLIC PANEL
Anion gap: 17 — ABNORMAL HIGH (ref 5–15)
BUN: 36 mg/dL — ABNORMAL HIGH (ref 6–20)
CO2: 24 mmol/L (ref 22–32)
Calcium: 8.4 mg/dL — ABNORMAL LOW (ref 8.9–10.3)
Chloride: 90 mmol/L — ABNORMAL LOW (ref 101–111)
Creatinine, Ser: 5.47 mg/dL — ABNORMAL HIGH (ref 0.61–1.24)
GFR calc Af Amer: 12 mL/min — ABNORMAL LOW (ref >60)
GFR calc non Af Amer: 11 mL/min — ABNORMAL LOW (ref >60)
Glucose, Bld: 114 mg/dL — ABNORMAL HIGH (ref 65–99)
Potassium: 4.4 mmol/L (ref 3.5–5.1)
Sodium: 131 mmol/L — ABNORMAL LOW (ref 135–145)

## 2017-11-06 LAB — GLUCOSE, CAPILLARY: Glucose-Capillary: 118 mg/dL — ABNORMAL HIGH (ref 65–99)

## 2017-11-06 MED ORDER — DARBEPOETIN ALFA 200 MCG/0.4ML IJ SOSY
200.0000 ug | PREFILLED_SYRINGE | INTRAMUSCULAR | Status: DC
  Filled 2017-11-06: qty 0.4

## 2017-11-06 MED ORDER — OXYCODONE HCL 5 MG PO TABS
5.0000 mg | ORAL_TABLET | Freq: Four times a day (QID) | ORAL | Status: DC | PRN
  Administered 2017-11-06 – 2017-11-07 (×3): 5 mg via ORAL
  Filled 2017-11-06 (×3): qty 1

## 2017-11-06 NOTE — Plan of Care (Signed)
  Problem: Education: Goal: Knowledge of General Education information will improve Outcome: Progressing   Problem: Clinical Measurements: Goal: Ability to maintain clinical measurements within normal limits will improve Outcome: Progressing Goal: Cardiovascular complication will be avoided Outcome: Progressing   Problem: Activity: Goal: Risk for activity intolerance will decrease Outcome: Progressing   Problem: Nutrition: Goal: Adequate nutrition will be maintained Outcome: Progressing   Problem: Pain Managment: Goal: General experience of comfort will improve Outcome: Progressing  Patient returned from dialysis complaining of shortness of breath and yelling out prayers. Pain in abdomen was given dilaudid 1mg  iv without much relief, given xanax for anxiety not effective, then ativan for anxiety/agitation. Patient able to rest quietly in bed.

## 2017-11-06 NOTE — Progress Notes (Signed)
[facility] Progress Note   Dialysis Orders: [facility] MWF 180NRe 400/800 EDW  69 kg 2K/2Ca 4 hr TDC No Hep bolus, R AVF placed 08/09/17 Venofer 100mg  IV x10 until 4/24 Mircera 265mcg IV q 2 weeks (last 4/17) Parsabiv 5mg  IV TIW  BMM Renvela 3 qac 2 q snacks Calcitriol 0.5  Assessment/Plan: 1. Presumptive SBP- on rocephin 2. ESRD -MWF -next HD tomorrow - had tmt Saturday and signed off50 min  early after net UF 3L - post wt 66.5  (EDW 69) - verify at discharge - will need a lower edw 3. Anemia - hgb 8.9 - due for redose ESA 5/1 s/p Fe 4. Secondary hyperparathyroidism - Ca 8.4 - off hectorol - parsabiv not avail - resume calcitriol 0.5 - just restarted - had been on hold due to hypercalcemia -resuming at lower dose - no binders 5. HTN/volume - CXR 4/24 showed some volume - titrating edw down 6. Nutrition - alb 3.2 7. Recurrent diarrhea - likely C diff - on oral Vanc 8. Cirrhosis 2/2 etoh, Hep B/C 9. PAF -meds per primary  10. Chronic pain/anxiety/depression 11. Thrombocytopenia - plts 130 K- no heparin HD 12. Substance abuse  Myriam Jacobson, PA-C [facility] [phone] 11/06/2017,9:18 AM  LOS: 2 days   Subjective:  "Thinks he's going to die in here. This diarrhea is bad"  Objective Vitals:   11/05/17 1835 11/05/17 2006 11/05/17 2046 11/06/17 0502  BP: (!) 172/108 (!) 127/103  (!) 171/95  Pulse: (!) 120 (!) 110 (!) 113 (!) 105  Resp:  16 (!) 21 20  Temp:    (!) 97.1 F (36.2 C)  TempSrc:      SpO2:   95% 99%  Weight:    66.3 kg (146 lb 2.6 oz)  Height:       Physical Exam General: NAD talkative, thin Heart: tachy reg Lungs: grossly clear Abdomen: markedly distended + ascites Extremities: no sig LE edema Dialysis Access:  AVF + bruit   Additional Objective Labs: Basic Metabolic Panel: Recent Labs  Lab 11/04/17 1727 11/05/17 0651 11/06/17 0434  NA 129* 125* 131*  K 5.4* 5.3* 4.4  CL 91* 89* 90*  CO2 23 22 24   GLUCOSE  120* 79 114*  BUN 50* 60* 36*  CREATININE 6.87* 7.72* 5.47*  CALCIUM 8.9 9.0 8.4*   Liver Function Tests: Recent Labs  Lab 11/03/17 1102 11/04/17 1727 11/05/17 0651  AST 47* 28 22  ALT 48 32 27  ALKPHOS 194* 181* 180*  BILITOT 1.0 1.3* 1.3*  PROT 6.7 7.0 6.8  ALBUMIN 3.1* 3.3* 3.2*   Recent Labs  Lab 11/03/17 1102  LIPASE 39   CBC: Recent Labs  Lab 11/03/17 1102 11/04/17 0148 11/05/17 0651 11/06/17 0434  WBC 13.5* 14.1* 12.9* 11.1*  NEUTROABS  --  11.6* 9.7*  --   HGB 10.7* 9.7* 9.4* 8.9*  HCT 31.4* 28.9* 27.9* 26.2*  MCV 76.6* 76.7* 75.0* 75.7*  PLT 192 158 156 130*   Blood Culture    Component Value Date/Time   SDES BLOOD RIGHT HAND 11/04/2017 0829   SPECREQUEST  11/04/2017 0829    BOTTLES DRAWN AEROBIC AND ANAEROBIC Blood Culture adequate volume   CULT  11/04/2017 0829    NO GROWTH 1 DAY Performed at [facility], [place] [address] [place], [place] [address]    REPTSTATUS PENDING 11/04/2017 6387    Cardiac Enzymes: No results for input(s): CKTOTAL, CKMB, CKMBINDEX, TROPONINI in the last 168 hours.  CBG: Recent Labs  Lab 11/04/17 0824 11/05/17 0836 11/05/17 2003 11/06/17 0815  GLUCAP 85 81 137* 118*   Iron Studies: No results for input(s): IRON, TIBC, TRANSFERRIN, FERRITIN in the last 72 hours. Lab Results  Component Value Date   INR 1.32 11/05/2017   INR 1.25 10/28/2017   INR 1.14 10/14/2017   Studies/Results: Ir Abdomen US Limited  Result Date: 11/04/2017 CLINICAL DATA:  Abdominal pain, recurrent abdominal ascites. Paracentesis requested. EXAM: LIMITED ABDOMEN ULTRASOUND FOR ASCITES TECHNIQUE: Limited ultrasound survey for ascites was performed in all four abdominal quadrants. COMPARISON:  10/28/2017 FINDINGS: Survey ultrasound demonstrates a small amount of scattered abdominal ascites. No large pocket to allow safe therapeutic paracentesis. IMPRESSION: Small amount of scattered abdominal ascites. Therapeutic paracentesis deferred.  Electronically Signed   By: Lucrezia Europe M.D.   On: 11/04/2017 09:44   Medications: . sodium chloride    . cefTRIAXone (ROCEPHIN)  IV     . clopidogrel  75 mg Oral Daily  . diltiazem  120 mg Oral Daily  . famotidine  20 mg Oral QHS  . feeding supplement (NEPRO CARB STEADY)  237 mL Oral BID BM  . folic acid  1 mg Oral Daily  . gabapentin  100 mg Oral BID  . heparin  5,000 Units Subcutaneous Q8H  . hydrALAZINE  100 mg Oral BID  . isosorbide mononitrate  30 mg Oral Daily  . mometasone-formoterol  2 puff Inhalation BID  . multivitamin with minerals  1 tablet Oral Daily  . nicotine  21 mg Transdermal Daily  . pantoprazole  40 mg Oral Daily  . sodium chloride flush  10-40 mL Intracatheter Q12H  . sodium chloride flush  3 mL Intravenous Q12H  . thiamine  100 mg Oral Daily   Or  . thiamine  100 mg Intravenous Daily  . tiotropium  18 mcg Inhalation Daily  . vancomycin  125 mg Oral QID

## 2017-11-06 NOTE — Progress Notes (Signed)
PROGRESS NOTE    [name]  UXN:[id number] DOB: [birth year] DOA: 11/04/2017 PCP: [name], MD    Brief Narrative by " Dr Herbert Moors ": 57 year old man with past medical history relevant for ESRD complicated by anemia, alcoholism, chronic hepatitis B and C complicated by cirrhosis and ascites, COPD, chronic diastolic heart failure by echo on 08/30/2017 with dilated RV, coronary artery disease status post proximal RCA stent placement on 02/22/2017, WHO group 2 severe pulmonary hypertension with very dilated RV by echo paroxysmal atrial fibrillation, recently admitted for abdominal pain and discharged with a diagnosis of spontaneous bacterial peritonitis with negative cultures coming in with recurrent and persistent lower abdominal pain.      Assessment & Plan:   Principal Problem:   SBP (spontaneous bacterial peritonitis) ([place]): PRESUMED. Active Problems:   Alcohol abuse   Chronic hepatitis B (HCC)   Chronic hepatitis C without hepatic coma (HCC)   COPD (chronic obstructive pulmonary disease) (HCC)   Anxiety   Anemia due to chronic kidney disease   ESRD (end stage renal disease) (HCC)   Other cirrhosis of liver (HCC)   Abdominal pain   Hyponatremia   1-C diff colitis; continue oral vancomycin.  Monitor diarrhea.  Hopefully cna discontinue ceftriaxone soon, on for SBP.   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 On ceftriaxone.   ESRD; on HD. Renal following.   HTN Continue Isorbid mononitrate 30 mg daily -Continue hydralazine 100 mg Monday Wednesday Friday  A fib  Diltiazem  Anemia of chronic diseases.   COPD: -Continue tiotropium -Continue ICS/LABA -PRN albuterol  Pain/psych: -Continue gabapentin 100 mg twice daily after hemodialysis -Continue as needed alprazolam   DVT prophylaxis;  Code Status: full code Family Communication: care discussed with patient Disposition Plan to be determine   Consultants:    Nephrology    Procedures: HD   Antimicrobials:   Vancomycin oral.    Subjective: He is complaining of abdominal pain.  Had BM this am.   Objective: Vitals:   11/05/17 1835 11/05/17 2006 11/05/17 2046 11/06/17 0502  BP: (!) 172/108 (!) 127/103  (!) 171/95  Pulse: (!) 120 (!) 110 (!) 113 (!) 105  Resp:  16 (!) 21 20  Temp:    (!) 97.1 F (36.2 C)  TempSrc:      SpO2:   95% 99%  Weight:    66.3 kg (146 lb 2.6 oz)  Height:        Intake/Output Summary (Last 24 hours) at 11/06/2017 0742 Last data filed at 11/05/2017 2200 Gross per 24 hour  Intake 240 ml  Output 3000 ml  Net -2760 ml   Filed Weights   11/05/17 1515 11/05/17 1830 11/06/17 0502  Weight: 69.5 kg (153 lb 3.5 oz) 66.5 kg (146 lb 9.7 oz) 66.3 kg (146 lb 2.6 oz)    Examination:  General exam: Appears calm and comfortable  Respiratory system: Clear to auscultation. Respiratory effort normal. Cardiovascular system: S1 & S2 heard, RRR. No JVD, murmurs, rubs, gallops or clicks. No pedal edema. Gastrointestinal system: Abdomen is nondistended, soft and nontender. No organomegaly or masses felt. Normal bowel sounds heard. Central nervous system: Alert and oriented. No focal neurological deficits. Extremities: Symmetric 5 x 5 power. Skin: No rashes, lesions or ulcers Psychiatry: Judgement and insight appear normal. Mood & affect appropriate.     Data Reviewed: I have personally reviewed following labs and imaging studies  CBC: Recent Labs  Lab 11/03/17 1102 11/04/17 0148 11/05/17 0651 11/06/17  0434  WBC 13.5* 14.1* 12.9* 11.1*  NEUTROABS  --  11.6* 9.7*  --   HGB 10.7* 9.7* 9.4* 8.9*  HCT 31.4* 28.9* 27.9* 26.2*  MCV 76.6* 76.7* 75.0* 75.7*  PLT 192 158 156 035*   Basic Metabolic Panel: Recent Labs  Lab 11/03/17 1102 11/04/17 0148 11/04/17 1727 11/05/17 0651 11/06/17 0434  NA 128* 127* 129* 125* 131*  K 4.8 5.1 5.4* 5.3* 4.4  CL 90* 86* 91* 89* 90*  CO2 25 24 23 22 24   GLUCOSE 93 101*  120* 79 114*  BUN 29* 38* 50* 60* 36*  CREATININE 4.94* 5.92* 6.87* 7.72* 5.47*  CALCIUM 8.7* 8.9 8.9 9.0 8.4*  MG  --   --   --  2.4  --    GFR: Estimated Creatinine Clearance: 14.3 mL/min (A) (by C-G formula based on SCr of 5.47 mg/dL (H)). Liver Function Tests: Recent Labs  Lab 11/03/17 1102 11/04/17 1727 11/05/17 0651  AST 47* 28 22  ALT 48 32 27  ALKPHOS 194* 181* 180*  BILITOT 1.0 1.3* 1.3*  PROT 6.7 7.0 6.8  ALBUMIN 3.1* 3.3* 3.2*   Recent Labs  Lab 11/03/17 1102  LIPASE 39   No results for input(s): AMMONIA in the last 168 hours. Coagulation Profile: Recent Labs  Lab 11/05/17 0651  INR 1.32   Cardiac Enzymes: No results for input(s): CKTOTAL, CKMB, CKMBINDEX, TROPONINI in the last 168 hours. BNP (last 3 results) No results for input(s): PROBNP in the last 8760 hours. HbA1C: No results for input(s): HGBA1C in the last 72 hours. CBG: Recent Labs  Lab 11/04/17 0824 11/05/17 0836 11/05/17 2003  GLUCAP 85 81 137*   Lipid Profile: No results for input(s): CHOL, HDL, LDLCALC, TRIG, CHOLHDL, LDLDIRECT in the last 72 hours. Thyroid Function Tests: No results for input(s): TSH, T4TOTAL, FREET4, T3FREE, THYROIDAB in the last 72 hours. Anemia Panel: No results for input(s): VITAMINB12, FOLATE, FERRITIN, TIBC, IRON, RETICCTPCT in the last 72 hours. Sepsis Labs: No results for input(s): PROCALCITON, LATICACIDVEN in the last 168 hours.  Recent Results (from the past 240 hour(s))  Body fluid culture     Status: None   Collection Time: 10/28/17 11:45 AM  Result Value Ref Range Status   Specimen Description FLUID PERITONEAL CAVITY  Final   Special Requests NONE  Final   Gram Stain   Final    FEW WBC PRESENT,BOTH PMN AND MONONUCLEAR NO ORGANISMS SEEN    Culture   Final    NO GROWTH 3 DAYS Performed at [facility], [address] [place], [place] [address]    Report Status 10/31/2017 FINAL  Final  Culture, blood (routine x 2)     Status: None  (Preliminary result)   Collection Time: 11/04/17  8:20 AM  Result Value Ref Range Status   Specimen Description BLOOD RIGHT ARM  Final   Special Requests   Final    BOTTLES DRAWN AEROBIC ONLY Blood Culture adequate volume   Culture   Final    NO GROWTH 1 DAY Performed at [facility], [place] [address], [place], [place] [address]    Report Status PENDING  Incomplete  Culture, blood (routine x 2)     Status: None (Preliminary result)   Collection Time: 11/04/17  8:29 AM  Result Value Ref Range Status   Specimen Description BLOOD RIGHT HAND  Final   Special Requests   Final    BOTTLES DRAWN AEROBIC AND ANAEROBIC Blood Culture adequate volume  Culture   Final    NO GROWTH 1 DAY Performed at [facility], [place] [address], [place], [place] [address]    Report Status PENDING  Incomplete  C difficile quick scan w PCR reflex     Status: Abnormal   Collection Time: 11/04/17  2:53 PM  Result Value Ref Range Status   C Diff antigen POSITIVE (A) NEGATIVE Final   C Diff toxin NEGATIVE NEGATIVE Final   C Diff interpretation Results are indeterminate. See PCR results.  Final  C. Diff by PCR, Reflexed     Status: Abnormal   Collection Time: 11/04/17  2:53 PM  Result Value Ref Range Status   Toxigenic C. Difficile by PCR POSITIVE (A) NEGATIVE Final    Comment: Positive for toxigenic C. difficile with little to no toxin production. Only treat if clinical presentation suggests symptomatic illness. Performed at [facility], [place] [address] [place], [place] [address]   MRSA PCR Screening     Status: None   Collection Time: 11/04/17  8:20 PM  Result Value Ref Range Status   MRSA by PCR NEGATIVE NEGATIVE Final    Comment:        The GeneXpert MRSA Assay (FDA approved for NASAL specimens only), is one component of a comprehensive MRSA colonization surveillance program. It is not intended to diagnose MRSA infection nor to guide or monitor treatment for MRSA  infections. Performed at [facility], [place] [address], [place], [place] [address]          Radiology Studies: Ir Abdomen US Limited  Result Date: 11/04/2017 CLINICAL DATA:  Abdominal pain, recurrent abdominal ascites. Paracentesis requested. EXAM: LIMITED ABDOMEN ULTRASOUND FOR ASCITES TECHNIQUE: Limited ultrasound survey for ascites was performed in all four abdominal quadrants. COMPARISON:  10/28/2017 FINDINGS: Survey ultrasound demonstrates a small amount of scattered abdominal ascites. No large pocket to allow safe therapeutic paracentesis. IMPRESSION: Small amount of scattered abdominal ascites. Therapeutic paracentesis deferred. Electronically Signed   By: Lucrezia Europe M.D.   On: 11/04/2017 09:44        Scheduled Meds: . clopidogrel  75 mg Oral Daily  . diltiazem  120 mg Oral Daily  . famotidine  20 mg Oral QHS  . feeding supplement (NEPRO CARB STEADY)  237 mL Oral BID BM  . folic acid  1 mg Oral Daily  . gabapentin  100 mg Oral BID  . heparin  5,000 Units Subcutaneous Q8H  . hydrALAZINE  100 mg Oral BID  . isosorbide mononitrate  30 mg Oral Daily  . mometasone-formoterol  2 puff Inhalation BID  . multivitamin with minerals  1 tablet Oral Daily  . nicotine  21 mg Transdermal Daily  . pantoprazole  40 mg Oral Daily  . sodium chloride flush  10-40 mL Intracatheter Q12H  . sodium chloride flush  3 mL Intravenous Q12H  . thiamine  100 mg Oral Daily   Or  . thiamine  100 mg Intravenous Daily  . tiotropium  18 mcg Inhalation Daily  . vancomycin  125 mg Oral QID   Continuous Infusions: . sodium chloride    . cefTRIAXone (ROCEPHIN)  IV       LOS: 2 days    Time spent: 35 minutes.     Elmarie Shiley, MD [facility] Pager [phone]  If 7PM-7AM, please contact night-coverage [email or web address] Password [facility] 11/06/2017, 7:42 AM

## 2017-11-06 NOTE — Progress Notes (Signed)
Patient biting nailbeds to the point of bleeding and skin tearing.  Patient offered to have right hand fingers cleansed and dressed. Patient also strongly encouraged to discontinue biting his nail secondary to possible exposure to infection.  Patient non compliant at this time will reinforce.

## 2017-11-07 LAB — GLUCOSE, CAPILLARY
Glucose-Capillary: 104 mg/dL — ABNORMAL HIGH (ref 65–99)
Glucose-Capillary: 102 mg/dL — ABNORMAL HIGH (ref 65–99)

## 2017-11-07 MED ORDER — OXYCODONE HCL 5 MG PO TABS
5.0000 mg | ORAL_TABLET | Freq: Four times a day (QID) | ORAL | Status: DC | PRN
  Administered 2017-11-07 – 2017-11-12 (×15): 10 mg via ORAL
  Filled 2017-11-07 (×14): qty 2

## 2017-11-07 NOTE — Plan of Care (Signed)
  Problem: Pain Managment: Goal: General experience of comfort will improve Outcome: Progressing   Problem: Safety: Goal: Ability to remain free from injury will improve Outcome: Progressing   Problem: Skin Integrity: Goal: Risk for impaired skin integrity will decrease Outcome: Progressing   

## 2017-11-07 NOTE — Progress Notes (Signed)
[facility] Progress Note  Subjective: no new c/o's, complains of ED and wants something to help his maintain erection  Vitals:   11/06/17 1702 11/06/17 2141 11/07/17 0700 11/07/17 1048  BP: 138/81 107/78 (!) 150/94   Pulse: (!) 110 100 98   Resp:  17 18   Temp: 98.5 F (36.9 C) 99.9 F (37.7 C) 98.7 F (37.1 C)   TempSrc: Oral Oral Oral   SpO2: 98% 98% 97% 96%  Weight:      Height:        Inpatient medications: . clopidogrel  75 mg Oral Daily  . [START ON 11/09/2017] darbepoetin (ARANESP) injection - DIALYSIS  200 mcg Intravenous Q Wed-HD  . diltiazem  120 mg Oral Daily  . famotidine  20 mg Oral QHS  . feeding supplement (NEPRO CARB STEADY)  237 mL Oral BID BM  . folic acid  1 mg Oral Daily  . gabapentin  100 mg Oral BID  . heparin  5,000 Units Subcutaneous Q8H  . hydrALAZINE  100 mg Oral BID  . isosorbide mononitrate  30 mg Oral Daily  . mometasone-formoterol  2 puff Inhalation BID  . multivitamin with minerals  1 tablet Oral Daily  . nicotine  21 mg Transdermal Daily  . pantoprazole  40 mg Oral Daily  . sodium chloride flush  10-40 mL Intracatheter Q12H  . sodium chloride flush  3 mL Intravenous Q12H  . thiamine  100 mg Oral Daily   Or  . thiamine  100 mg Intravenous Daily  . tiotropium  18 mcg Inhalation Daily  . vancomycin  125 mg Oral QID   . sodium chloride     sodium chloride, albuterol, ALPRAZolam, bisacodyl, guaiFENesin, HYDROmorphone (DILAUDID) injection, ondansetron **OR** ondansetron (ZOFRAN) IV, oxyCODONE, senna-docusate, sodium chloride flush, sodium chloride flush  Exam:  Alert, no distress Thin No jvd Chest clear to bases bilat RRR  abd soft mod protuberant nontender mild ascites Ext no leg edema Right upper arm avf w/ bruit   Dialysis: [facility] 4h  2/2 bath  69kg  400/800   TDC/ RUA AVF (Aug 09, 2017)/ heparin none Venofer 100mg  IV x10 until 4/24 Mircera 290mcg IV q 2 weeks (last 4/17) Parsabiv 5mg  IV TIW  BMM Renvela 3 qac  2 q snacks Calcitriol 0.5      Impression: 1  Cdif colitis/ diarrhea per primary 2  spont bact peritonitis on rocephin per primary 3  esrd hd [facility] 4  Cirrhosis from etoh and hep b 5  Hypertension/ vol under dry wt, cont hydral + diltiazem 6  Cad sp stent to rca 7  Severe pulm htn 9  parox afib - diltiazem  Plan - dialysis today   [name] MD [facility] pager [phone]   11/07/2017, 2:34 PM   Recent Labs  Lab 11/04/17 1727 11/05/17 0651 11/06/17 0434  NA 129* 125* 131*  K 5.4* 5.3* 4.4  CL 91* 89* 90*  CO2 23 22 24   GLUCOSE 120* 79 114*  BUN 50* 60* 36*  CREATININE 6.87* 7.72* 5.47*  CALCIUM 8.9 9.0 8.4*   Recent Labs  Lab 11/03/17 1102 11/04/17 1727 11/05/17 0651  AST 47* 28 22  ALT 48 32 27  ALKPHOS 194* 181* 180*  BILITOT 1.0 1.3* 1.3*  PROT 6.7 7.0 6.8  ALBUMIN 3.1* 3.3* 3.2*   Recent Labs  Lab 11/04/17 0148 11/05/17 0651 11/06/17 0434  WBC 14.1* 12.9* 11.1*  NEUTROABS 11.6* 9.7*  --   HGB  9.7* 9.4* 8.9*  HCT 28.9* 27.9* 26.2*  MCV 76.7* 75.0* 75.7*  PLT 158 156 130*   Iron/TIBC/Ferritin/ %Sat    Component Value Date/Time   IRON 104 09/03/2017 0800   TIBC 343 09/03/2017 0800   FERRITIN 513 (H) 09/03/2017 0800   IRONPCTSAT 30 09/03/2017 0800

## 2017-11-07 NOTE — Progress Notes (Signed)
Pt continues to refuse inhalers, will take Albuterol nebs

## 2017-11-07 NOTE — Telephone Encounter (Signed)
Patient is still admitted to hospital 

## 2017-11-07 NOTE — Progress Notes (Signed)
PROGRESS NOTE    [name]  HAL:[id number] DOB: 1960/07/17 DOA: 11/04/2017 PCP: [name], MD    Brief Narrative by " Dr Herbert Moors ": 57 year old man with past medical history relevant for ESRD complicated by anemia, alcoholism, chronic hepatitis B and C complicated by cirrhosis and ascites, COPD, chronic diastolic heart failure by echo on 08/30/2017 with dilated RV, coronary artery disease status post proximal RCA stent placement on 02/22/2017, WHO group 2 severe pulmonary hypertension with very dilated RV by echo paroxysmal atrial fibrillation, recently admitted for abdominal pain and discharged with a diagnosis of spontaneous bacterial peritonitis with negative cultures coming in with recurrent and persistent lower abdominal pain.  Assessment & Plan:   Principal Problem:   SBP (spontaneous bacterial peritonitis) ([place]): PRESUMED. Active Problems:   Alcohol abuse   Chronic hepatitis B (HCC)   Chronic hepatitis C without hepatic coma (HCC)   COPD (chronic obstructive pulmonary disease) (HCC)   Anxiety   Anemia due to chronic kidney disease   ESRD (end stage renal disease) (HCC)   Other cirrhosis of liver (HCC)   Abdominal pain   Hyponatremia   1-C diff colitis; continue oral vancomycin.  Monitor diarrhea.  Hopefully cna discontinue ceftriaxone soon, on for SBP.   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 On ceftriaxone. Day 3 during this admission. Supposed to get one week since last discharge. Will try to find out if he received antibiotic at HD 22 and 24 at HD. If he received antibiotics those days will stop ceftriaxone.   ESRD; on HD. Renal following.   HTN Continue Isorbid mononitrate 30 mg daily -Continue hydralazine 100 mg Monday Wednesday Friday  A fib  Diltiazem  Anemia of chronic diseases.   COPD: -Continue tiotropium -Continue ICS/LABA -PRN albuterol  Pain/psych: -Continue gabapentin 100 mg twice daily after  hemodialysis -Continue as needed alprazolam   DVT prophylaxis;  Code Status: full code Family Communication: care discussed with patient Disposition Plan to be determine   Consultants:   Nephrology    Procedures: HD   Antimicrobials:   Vancomycin oral.    Subjective: He is still complaining of abdominal pain. He doesn't know how many BM he had yesterday  He doesn't think that his abdomen is more distended. .   Objective: Vitals:   11/06/17 1702 11/06/17 2141 11/07/17 0700 11/07/17 1048  BP: 138/81 107/78 (!) 150/94   Pulse: (!) 110 100 98   Resp:  17 18   Temp: 98.5 F (36.9 C) 99.9 F (37.7 C) 98.7 F (37.1 C)   TempSrc: Oral Oral Oral   SpO2: 98% 98% 97% 96%  Weight:      Height:        Intake/Output Summary (Last 24 hours) at 11/07/2017 1353 Last data filed at 11/07/2017 0900 Gross per 24 hour  Intake 240 ml  Output -  Net 240 ml   Filed Weights   11/05/17 1515 11/05/17 1830 11/06/17 0502  Weight: 69.5 kg (153 lb 3.5 oz) 66.5 kg (146 lb 9.7 oz) 66.3 kg (146 lb 2.6 oz)    Examination:  General exam: NAD Respiratory system: CTA Cardiovascular system: S 1, S 2 RRR Gastrointestinal system: BS present, distended, tender.  Central nervous system: non focal.  Extremities:  Symmetric power. . Skin: no rash      Data Reviewed: I have personally reviewed following labs and imaging studies  CBC: Recent Labs  Lab 11/03/17 1102 11/04/17 0148 11/05/17 0651 11/06/17 0434  WBC  13.5* 14.1* 12.9* 11.1*  NEUTROABS  --  11.6* 9.7*  --   HGB 10.7* 9.7* 9.4* 8.9*  HCT 31.4* 28.9* 27.9* 26.2*  MCV 76.6* 76.7* 75.0* 75.7*  PLT 192 158 156 885*   Basic Metabolic Panel: Recent Labs  Lab 11/03/17 1102 11/04/17 0148 11/04/17 1727 11/05/17 0651 11/06/17 0434  NA 128* 127* 129* 125* 131*  K 4.8 5.1 5.4* 5.3* 4.4  CL 90* 86* 91* 89* 90*  CO2 25 24 23 22 24   GLUCOSE 93 101* 120* 79 114*  BUN 29* 38* 50* 60* 36*  CREATININE 4.94* 5.92* 6.87* 7.72*  5.47*  CALCIUM 8.7* 8.9 8.9 9.0 8.4*  MG  --   --   --  2.4  --    GFR: Estimated Creatinine Clearance: 14.3 mL/min (A) (by C-G formula based on SCr of 5.47 mg/dL (H)). Liver Function Tests: Recent Labs  Lab 11/03/17 1102 11/04/17 1727 11/05/17 0651  AST 47* 28 22  ALT 48 32 27  ALKPHOS 194* 181* 180*  BILITOT 1.0 1.3* 1.3*  PROT 6.7 7.0 6.8  ALBUMIN 3.1* 3.3* 3.2*   Recent Labs  Lab 11/03/17 1102  LIPASE 39   No results for input(s): AMMONIA in the last 168 hours. Coagulation Profile: Recent Labs  Lab 11/05/17 0651  INR 1.32   Cardiac Enzymes: No results for input(s): CKTOTAL, CKMB, CKMBINDEX, TROPONINI in the last 168 hours. BNP (last 3 results) No results for input(s): PROBNP in the last 8760 hours. HbA1C: No results for input(s): HGBA1C in the last 72 hours. CBG: Recent Labs  Lab 11/04/17 0824 11/05/17 0836 11/05/17 2003 11/06/17 0815 11/07/17 0909  GLUCAP 85 81 137* 118* 104*   Lipid Profile: No results for input(s): CHOL, HDL, LDLCALC, TRIG, CHOLHDL, LDLDIRECT in the last 72 hours. Thyroid Function Tests: No results for input(s): TSH, T4TOTAL, FREET4, T3FREE, THYROIDAB in the last 72 hours. Anemia Panel: No results for input(s): VITAMINB12, FOLATE, FERRITIN, TIBC, IRON, RETICCTPCT in the last 72 hours. Sepsis Labs: No results for input(s): PROCALCITON, LATICACIDVEN in the last 168 hours.  Recent Results (from the past 240 hour(s))  Culture, blood (routine x 2)     Status: None (Preliminary result)   Collection Time: 11/04/17  8:20 AM  Result Value Ref Range Status   Specimen Description BLOOD RIGHT ARM  Final   Special Requests   Final    BOTTLES DRAWN AEROBIC ONLY Blood Culture adequate volume   Culture   Final    NO GROWTH 2 DAYS Performed at [facility], [address], [place], [place] [address]    Report Status PENDING  Incomplete  Culture, blood (routine x 2)     Status: None (Preliminary result)   Collection Time: 11/04/17  8:29  AM  Result Value Ref Range Status   Specimen Description BLOOD RIGHT HAND  Final   Special Requests   Final    BOTTLES DRAWN AEROBIC AND ANAEROBIC Blood Culture adequate volume   Culture   Final    NO GROWTH 2 DAYS Performed at [facility], [place] [address] [place], [place] [address]    Report Status PENDING  Incomplete  C difficile quick scan w PCR reflex     Status: Abnormal   Collection Time: 11/04/17  2:53 PM  Result Value Ref Range Status   C Diff antigen POSITIVE (A) NEGATIVE Final   C Diff toxin NEGATIVE NEGATIVE Final   C Diff interpretation Results are indeterminate. See PCR results.  Final  Stool culture (children & immunocomp patients)     Status: None (Preliminary result)   Collection Time: 11/04/17  2:53 PM  Result Value Ref Range Status   Salmonella/Shigella Screen Final report  Final   Campylobacter Culture PENDING  Incomplete   E coli, Shiga toxin Assay Negative Negative Final    Comment: (NOTE) Performed At: [facility] [place], [place] [id number] Rush Farmer MD [id number] Performed at [facility], [place] [address] [place], [place] [address]   C. Diff by PCR, Reflexed     Status: Abnormal   Collection Time: 11/04/17  2:53 PM  Result Value Ref Range Status   Toxigenic C. Difficile by PCR POSITIVE (A) NEGATIVE Final    Comment: Positive for toxigenic C. difficile with little to no toxin production. Only treat if clinical presentation suggests symptomatic illness. Performed at [facility], [place] [address], [place], [place] [address]   STOOL CULTURE REFLEX - RSASHR     Status: None   Collection Time: 11/04/17  2:53 PM  Result Value Ref Range Status   Stool Culture result 1 (RSASHR) Comment  Final    Comment: (NOTE) No Salmonella or Shigella recovered. Performed At: [facility] [place], [place] [id number] Rush Farmer MD [id number] Performed at [facility], [place] [address], [place], [place] [address]   MRSA PCR Screening     Status: None   Collection Time: 11/04/17  8:20 PM  Result Value Ref Range Status   MRSA by PCR NEGATIVE NEGATIVE Final    Comment:        The GeneXpert MRSA Assay (FDA approved for NASAL specimens only), is one component of a comprehensive MRSA colonization surveillance program. It is not intended to diagnose MRSA infection nor to guide or monitor treatment for MRSA infections. Performed at [facility], [place] [address], [place], [place] [address]          Radiology Studies: No results found.      Scheduled Meds: . clopidogrel  75 mg Oral Daily  . [START ON 11/09/2017] darbepoetin (ARANESP) injection - DIALYSIS  200 mcg Intravenous Q Wed-HD  . diltiazem  120 mg Oral Daily  . famotidine  20 mg Oral QHS  . feeding supplement (NEPRO CARB STEADY)  237 mL Oral BID BM  . folic acid  1 mg Oral Daily  . gabapentin  100 mg Oral BID  . heparin  5,000 Units Subcutaneous Q8H  . hydrALAZINE  100 mg Oral BID  . isosorbide mononitrate  30 mg Oral Daily  . mometasone-formoterol  2 puff Inhalation BID  . multivitamin with minerals  1 tablet Oral Daily  . nicotine  21 mg Transdermal Daily  . pantoprazole  40 mg Oral Daily  . sodium chloride flush  10-40 mL Intracatheter Q12H  . sodium chloride flush  3 mL Intravenous Q12H  . thiamine  100 mg Oral Daily   Or  . thiamine  100 mg Intravenous Daily  . tiotropium  18 mcg Inhalation Daily  . vancomycin  125 mg Oral QID   Continuous Infusions: . sodium chloride    . cefTRIAXone (ROCEPHIN)  IV Stopped (11/06/17 1209)     LOS: 3 days    Time spent: 35 minutes.     Elmarie Shiley, MD [facility] Pager [phone]  If 7PM-7AM, please contact night-coverage [email or web address] Password [email or web address] 11/07/2017, 1:53 PM

## 2017-11-07 NOTE — Progress Notes (Signed)
Chaplain responded to a consult that said Pt was requesting Prayer--However, when chaplain stopped by Pt was on the phone and said he wanted a chaplain to stopped by at a later time. Pt couldn't say when He would like a visit. So chaplain told Pt to please let his RN know when he was ready for a visit. Pt said okay.   [phone]

## 2017-11-08 ENCOUNTER — Inpatient Hospital Stay (HOSPITAL_COMMUNITY): Payer: Medicare Other

## 2017-11-08 LAB — CBC
HCT: 26.2 % — ABNORMAL LOW (ref 39.0–52.0)
Hemoglobin: 8.9 g/dL — ABNORMAL LOW (ref 13.0–17.0)
MCH: 25.3 pg — ABNORMAL LOW (ref 26.0–34.0)
MCHC: 34 g/dL (ref 30.0–36.0)
MCV: 74.4 fL — ABNORMAL LOW (ref 78.0–100.0)
Platelets: 155 10*3/uL (ref 150–400)
RBC: 3.52 MIL/uL — ABNORMAL LOW (ref 4.22–5.81)
RDW: 21.3 % — ABNORMAL HIGH (ref 11.5–15.5)
WBC: 9.8 10*3/uL (ref 4.0–10.5)

## 2017-11-08 LAB — RENAL FUNCTION PANEL
Albumin: 3 g/dL — ABNORMAL LOW (ref 3.5–5.0)
Anion gap: 17 — ABNORMAL HIGH (ref 5–15)
BUN: 59 mg/dL — ABNORMAL HIGH (ref 6–20)
CO2: 25 mmol/L (ref 22–32)
Calcium: 9.1 mg/dL (ref 8.9–10.3)
Chloride: 87 mmol/L — ABNORMAL LOW (ref 101–111)
Creatinine, Ser: 8.28 mg/dL — ABNORMAL HIGH (ref 0.61–1.24)
GFR calc Af Amer: 7 mL/min — ABNORMAL LOW (ref >60)
GFR calc non Af Amer: 6 mL/min — ABNORMAL LOW (ref >60)
Glucose, Bld: 95 mg/dL (ref 65–99)
Phosphorus: 4.5 mg/dL (ref 2.5–4.6)
Potassium: 5.4 mmol/L — ABNORMAL HIGH (ref 3.5–5.1)
Sodium: 129 mmol/L — ABNORMAL LOW (ref 135–145)

## 2017-11-08 LAB — STOOL CULTURE: E coli, Shiga toxin Assay: NEGATIVE

## 2017-11-08 LAB — STOOL CULTURE REFLEX - CMPCXR

## 2017-11-08 LAB — STOOL CULTURE REFLEX - RSASHR

## 2017-11-08 MED ORDER — ALPRAZOLAM 0.5 MG PO TABS
ORAL_TABLET | ORAL | Status: AC
  Filled 2017-11-08: qty 1

## 2017-11-08 MED ORDER — OXYCODONE HCL 5 MG PO TABS
ORAL_TABLET | ORAL | Status: AC
  Filled 2017-11-08: qty 2

## 2017-11-08 MED ORDER — HYDROMORPHONE HCL 1 MG/ML IJ SOLN
1.0000 mg | Freq: Once | INTRAMUSCULAR | Status: AC
  Administered 2017-11-08: 1 mg via INTRAVENOUS
  Filled 2017-11-08: qty 1

## 2017-11-08 MED ORDER — SEVELAMER CARBONATE 800 MG PO TABS
800.0000 mg | ORAL_TABLET | Freq: Once | ORAL | Status: AC
  Administered 2017-11-09: 800 mg via ORAL
  Filled 2017-11-08: qty 1

## 2017-11-08 MED ORDER — PENTAFLUOROPROP-TETRAFLUOROETH EX AERO: 1.0000 "application " | INHALATION_SPRAY | CUTANEOUS | Status: DC | PRN

## 2017-11-08 MED ORDER — SODIUM CHLORIDE 0.9 % IV SOLN: 100.0000 mL | INTRAVENOUS | Status: DC | PRN

## 2017-11-08 MED ORDER — LIDOCAINE HCL (PF) 1 % IJ SOLN
5.0000 mL | INTRAMUSCULAR | Status: DC | PRN
  Filled 2017-11-08: qty 5

## 2017-11-08 MED ORDER — IPRATROPIUM-ALBUTEROL 0.5-2.5 (3) MG/3ML IN SOLN
3.0000 mL | Freq: Four times a day (QID) | RESPIRATORY_TRACT | Status: DC
Start: 2017-11-08 — End: 2017-11-09
  Administered 2017-11-08 (×2): 3 mL via RESPIRATORY_TRACT
  Filled 2017-11-08 (×2): qty 3

## 2017-11-08 MED ORDER — ALTEPLASE 2 MG IJ SOLR
2.0000 mg | Freq: Once | INTRAMUSCULAR | Status: DC | PRN
  Filled 2017-11-08: qty 2

## 2017-11-08 MED ORDER — GUAIFENESIN 100 MG/5ML PO SOLN
10.0000 mL | ORAL | Status: DC | PRN
  Administered 2017-11-08 – 2017-11-11 (×7): 200 mg via ORAL
  Filled 2017-11-08 (×2): qty 10
  Filled 2017-11-08 (×8): qty 5

## 2017-11-08 MED ORDER — LIDOCAINE-PRILOCAINE 2.5-2.5 % EX CREA
1.0000 "application " | TOPICAL_CREAM | CUTANEOUS | Status: DC | PRN
  Filled 2017-11-08: qty 5

## 2017-11-08 MED ORDER — METHYLPREDNISOLONE SODIUM SUCC 40 MG IJ SOLR
40.0000 mg | Freq: Two times a day (BID) | INTRAMUSCULAR | Status: DC
  Administered 2017-11-08 – 2017-11-09 (×3): 40 mg via INTRAVENOUS
  Filled 2017-11-08 (×3): qty 1

## 2017-11-08 MED ORDER — HEPARIN SODIUM (PORCINE) 1000 UNIT/ML DIALYSIS
1000.0000 [IU] | INTRAMUSCULAR | Status: DC | PRN
  Filled 2017-11-08: qty 1

## 2017-11-08 NOTE — Progress Notes (Signed)
RT had to cut off tx mid-treatment due to PT needing to go to the bathroom.

## 2017-11-08 NOTE — Progress Notes (Signed)
Report given to Dain,RN in dialysis.

## 2017-11-08 NOTE — Progress Notes (Addendum)
[facility] Progress Note  Subjective:  Seen in room after HD off schedule today  Coughing c/o pain in L side    Vitals:   11/08/17 1045 11/08/17 1100 11/08/17 1115 11/08/17 1125  BP: (!) 154/138 (!) 156/110 (!) 153/94 (!) 177/126  Pulse:   (!) 103   Resp: (!) 25  (!) 25 (!) 23  Temp:   98 F (36.7 C)   TempSrc:   Oral   SpO2:      Weight:      Height:        Inpatient medications: . clopidogrel  75 mg Oral Daily  . [START ON 11/09/2017] darbepoetin (ARANESP) injection - DIALYSIS  200 mcg Intravenous Q Wed-HD  . diltiazem  120 mg Oral Daily  . famotidine  20 mg Oral QHS  . feeding supplement (NEPRO CARB STEADY)  237 mL Oral BID BM  . folic acid  1 mg Oral Daily  . gabapentin  100 mg Oral BID  . heparin  5,000 Units Subcutaneous Q8H  . hydrALAZINE  100 mg Oral BID  . isosorbide mononitrate  30 mg Oral Daily  . mometasone-formoterol  2 puff Inhalation BID  . multivitamin with minerals  1 tablet Oral Daily  . nicotine  21 mg Transdermal Daily  . pantoprazole  40 mg Oral Daily  . sevelamer carbonate  800 mg Oral Once  . sodium chloride flush  10-40 mL Intracatheter Q12H  . sodium chloride flush  3 mL Intravenous Q12H  . thiamine  100 mg Oral Daily   Or  . thiamine  100 mg Intravenous Daily  . tiotropium  18 mcg Inhalation Daily  . vancomycin  125 mg Oral QID   . sodium chloride    . sodium chloride    . sodium chloride     sodium chloride, sodium chloride, sodium chloride, albuterol, ALPRAZolam, alteplase, bisacodyl, guaiFENesin, heparin, HYDROmorphone (DILAUDID) injection, lidocaine (PF), lidocaine-prilocaine, ondansetron **OR** ondansetron (ZOFRAN) IV, oxyCODONE, pentafluoroprop-tetrafluoroeth, senna-docusate, sodium chloride flush, sodium chloride flush  Exam:  Alert, no distress Thin No jvd Chest clear to bases bilat RRR  abd soft mod protuberant nontender mild ascites Ext no leg edema Right upper arm avf w/ bruit   Dialysis: [facility] 4h   2/2 bath  69kg  400/800   TDC/ RUA AVF (Aug 09, 2017)/ heparin none Venofer 100mg  IV x10 until 4/24 Mircera 241mcg IV q 2 weeks (last 4/17) Parsabiv 5mg  IV TIW  BMM Renvela 3 qac 2 q snacks Calcitriol 0.5      Impression: 1  Cdif colitis/ diarrhea per primary 2  spont bact peritonitis. Rocephin d/c'd. Got 2 doses IV abx as outpatient last week  3  esrd hd [facility] 4  Cirrhosis from etoh and hep b 5  Hypertension/ BP up cont hydral + diltiazem/ No post HD weights today  6  Cad sp stent to rca 7  Severe pulm htn 9  parox afib - diltiazem  Plan - Next HD tomorrow on schedule UF to EDW as tolerated   Lynnda Child PA-C [facility] Pager [phone] 11/08/2017,12:47 PM  Pt seen, examined and agree w A/P as above.  [name] MD [facility] pager [phone]   11/08/2017, 4:04 PM     Recent Labs  Lab 11/05/17 0651 11/06/17 0434 11/08/17 0728  NA 125* 131* 129*  K 5.3* 4.4 5.4*  CL 89* 90* 87*  CO2 22 24 25   GLUCOSE 79 114* 95  BUN 60* 36*  59*  CREATININE 7.72* 5.47* 8.28*  CALCIUM 9.0 8.4* 9.1  PHOS  --   --  4.5   Recent Labs  Lab 11/03/17 1102 11/04/17 1727 11/05/17 0651 11/08/17 0728  AST 47* 28 22  --   ALT 48 32 27  --   ALKPHOS 194* 181* 180*  --   BILITOT 1.0 1.3* 1.3*  --   PROT 6.7 7.0 6.8  --   ALBUMIN 3.1* 3.3* 3.2* 3.0*   Recent Labs  Lab 11/04/17 0148 11/05/17 0651 11/06/17 0434 11/08/17 0728  WBC 14.1* 12.9* 11.1* 9.8  NEUTROABS 11.6* 9.7*  --   --   HGB 9.7* 9.4* 8.9* 8.9*  HCT 28.9* 27.9* 26.2* 26.2*  MCV 76.7* 75.0* 75.7* 74.4*  PLT 158 156 130* 155   Iron/TIBC/Ferritin/ %Sat    Component Value Date/Time   IRON 104 09/03/2017 0800   TIBC 343 09/03/2017 0800   FERRITIN 513 (H) 09/03/2017 0800   IRONPCTSAT 30 09/03/2017 0800

## 2017-11-08 NOTE — Procedures (Addendum)
Post Dialysis report given to [facility] RN CN pt came off machine 29mins per request MD aware

## 2017-11-08 NOTE — Progress Notes (Addendum)
PROGRESS NOTE    [name]  BTD:[id number] DOB: Mar 11, 1961 DOA: 11/04/2017 PCP: [name], MD    Brief Narrative by " Dr Herbert Moors ": 57 year old man with past medical history relevant for ESRD complicated by anemia, alcoholism, chronic hepatitis B and C complicated by cirrhosis and ascites, COPD, chronic diastolic heart failure by echo on 08/30/2017 with dilated RV, coronary artery disease status post proximal RCA stent placement on 02/22/2017, WHO group 2 severe pulmonary hypertension with very dilated RV by echo paroxysmal atrial fibrillation, recently admitted for abdominal pain and discharged with a diagnosis of spontaneous bacterial peritonitis with negative cultures coming in with recurrent and persistent lower abdominal pain. Diagnosed with C diff colitis this admission. He was started on Oral vancomycin. He had 2 BM yesterday. He received 3 days of IV ceftriaxone during this hospitalization, and received 2 days on IV antibiotics on HD out patient. He completed treatment for SBP.   Today he is still having abdominal pain. He is also having SOB. He relate he shock on HD with bagel.   Assessment & Plan:   Principal Problem:   SBP (spontaneous bacterial peritonitis) ([place]): PRESUMED. Active Problems:   Alcohol abuse   Chronic hepatitis B (HCC)   Chronic hepatitis C without hepatic coma (HCC)   COPD (chronic obstructive pulmonary disease) (HCC)   Anxiety   Anemia due to chronic kidney disease   ESRD (end stage renal disease) (HCC)   Other cirrhosis of liver (HCC)   Abdominal pain   Hyponatremia   1-Dyspnea,  Increase work of breathing vs anxiety, Vs aspiration event.  Stat Chest x ray.  Stat Nebulizer treatment.  He relates he was on prednisone. Will start IV solumedrol.  Continue with xanax PRN  C diff colitis; continue oral vancomycin.  Diarrhea improving. Still with abdominal pain.   Cirrhosis due to alcohol and hepatitis B Recent diagnosis of  SBP. Received 3 days of IV ceftriaxone, and 2 doses out patient HD of [place]. Completed treatment.  Still with abdominal pain. Will order paracentesis, therapeutic and diagnostic.  He has multiple comorbidity, he is very anxious. I will consult Palliative care for goals of care and symptoms management.   ESRD; on HD. Had HD today.    HTN Continue Isorbid mononitrate 30 mg daily -Continue hydralazine 100 mg Monday Wednesday Friday  A fib  Diltiazem  Anemia of chronic diseases.   COPD: -Change  tiotropium to ipratropium.  -Continue ICS/LABA -PRN albuterol  Pain/psych: -Continue gabapentin 100 mg twice daily after hemodialysis -Continue as needed alprazolam   DVT prophylaxis;  Code Status: full code Family Communication: care discussed with patient Disposition Plan to be determine   Consultants:   Nephrology    Procedures: HD   Antimicrobials:   Vancomycin oral.    Subjective: He is having dyspnea. Report shocking on a bagel at HD>  Still with abdominal pain, left side. Had 2 BM yesterday. He is worry about his medical condition.   Objective: Vitals:   11/08/17 1045 11/08/17 1100 11/08/17 1115 11/08/17 1125  BP: (!) 154/138 (!) 156/110 (!) 153/94 (!) 177/126  Pulse:   (!) 103   Resp: (!) 25  (!) 25 (!) 23  Temp:   98 F (36.7 C)   TempSrc:   Oral   SpO2:      Weight:      Height:        Intake/Output Summary (Last 24 hours) at 11/08/2017 1433 Last data filed at 11/08/2017 1115 Gross per 24  hour  Intake -  Output 1800 ml  Net -1800 ml   Filed Weights   11/06/17 0502 11/08/17 0520 11/08/17 0800  Weight: 66.3 kg (146 lb 2.6 oz) 74 kg (163 lb 2.3 oz) 75.3 kg (166 lb 0.1 oz)    Examination:  General exam: mild distress, tachypnea, hyperventilating.  Respiratory system: Bilateral ronchus, wheezing.  Cardiovascular system: S 1, S 2 RRR Gastrointestinal system: BS present, distended.  Central nervous system: non focal.  Extremities:  Symmetric  power.  Skin: no rash      Data Reviewed: I have personally reviewed following labs and imaging studies  CBC: Recent Labs  Lab 11/03/17 1102 11/04/17 0148 11/05/17 0651 11/06/17 0434 11/08/17 0728  WBC 13.5* 14.1* 12.9* 11.1* 9.8  NEUTROABS  --  11.6* 9.7*  --   --   HGB 10.7* 9.7* 9.4* 8.9* 8.9*  HCT 31.4* 28.9* 27.9* 26.2* 26.2*  MCV 76.6* 76.7* 75.0* 75.7* 74.4*  PLT 192 158 156 130* 009   Basic Metabolic Panel: Recent Labs  Lab 11/04/17 0148 11/04/17 1727 11/05/17 0651 11/06/17 0434 11/08/17 0728  NA 127* 129* 125* 131* 129*  K 5.1 5.4* 5.3* 4.4 5.4*  CL 86* 91* 89* 90* 87*  CO2 24 23 22 24 25   GLUCOSE 101* 120* 79 114* 95  BUN 38* 50* 60* 36* 59*  CREATININE 5.92* 6.87* 7.72* 5.47* 8.28*  CALCIUM 8.9 8.9 9.0 8.4* 9.1  MG  --   --  2.4  --   --   PHOS  --   --   --   --  4.5   GFR: Estimated Creatinine Clearance: 10.1 mL/min (A) (by C-G formula based on SCr of 8.28 mg/dL (H)). Liver Function Tests: Recent Labs  Lab 11/03/17 1102 11/04/17 1727 11/05/17 0651 11/08/17 0728  AST 47* 28 22  --   ALT 48 32 27  --   ALKPHOS 194* 181* 180*  --   BILITOT 1.0 1.3* 1.3*  --   PROT 6.7 7.0 6.8  --   ALBUMIN 3.1* 3.3* 3.2* 3.0*   Recent Labs  Lab 11/03/17 1102  LIPASE 39   No results for input(s): AMMONIA in the last 168 hours. Coagulation Profile: Recent Labs  Lab 11/05/17 0651  INR 1.32   Cardiac Enzymes: No results for input(s): CKTOTAL, CKMB, CKMBINDEX, TROPONINI in the last 168 hours. BNP (last 3 results) No results for input(s): PROBNP in the last 8760 hours. HbA1C: No results for input(s): HGBA1C in the last 72 hours. CBG: Recent Labs  Lab 11/05/17 0836 11/05/17 2003 11/06/17 0815 11/07/17 0909 11/07/17 1659  GLUCAP 81 137* 118* 104* 102*   Lipid Profile: No results for input(s): CHOL, HDL, LDLCALC, TRIG, CHOLHDL, LDLDIRECT in the last 72 hours. Thyroid Function Tests: No results for input(s): TSH, T4TOTAL, FREET4, T3FREE,  THYROIDAB in the last 72 hours. Anemia Panel: No results for input(s): VITAMINB12, FOLATE, FERRITIN, TIBC, IRON, RETICCTPCT in the last 72 hours. Sepsis Labs: No results for input(s): PROCALCITON, LATICACIDVEN in the last 168 hours.  Recent Results (from the past 240 hour(s))  Culture, blood (routine x 2)     Status: None (Preliminary result)   Collection Time: 11/04/17  8:20 AM  Result Value Ref Range Status   Specimen Description BLOOD RIGHT ARM  Final   Special Requests   Final    BOTTLES DRAWN AEROBIC ONLY Blood Culture adequate volume   Culture   Final    NO GROWTH 4 DAYS Performed at [facility], [place] [address] [place], [place] [address]    Report Status PENDING  Incomplete  Culture, blood (routine x 2)     Status: None (Preliminary result)   Collection Time: 11/04/17  8:29 AM  Result Value Ref Range Status   Specimen Description BLOOD RIGHT HAND  Final   Special Requests   Final    BOTTLES DRAWN AEROBIC AND ANAEROBIC Blood Culture adequate volume   Culture   Final    NO GROWTH 4 DAYS Performed at [facility], [place] [address], [place], [place] [address]    Report Status PENDING  Incomplete  C difficile quick scan w PCR reflex     Status: Abnormal   Collection Time: 11/04/17  2:53 PM  Result Value Ref Range Status   C Diff antigen POSITIVE (A) NEGATIVE Final   C Diff toxin NEGATIVE NEGATIVE Final   C Diff interpretation Results are indeterminate. See PCR results.  Final  Stool culture (children & immunocomp patients)     Status: None (Preliminary result)   Collection Time: 11/04/17  2:53 PM  Result Value Ref Range Status   Salmonella/Shigella Screen Final report  Final   Campylobacter Culture PENDING  Incomplete   E coli, Shiga toxin Assay Negative Negative Final    Comment: (NOTE) Performed At: [facility] [place], [place] [id number] Rush Farmer MD UV:[id number] Performed at [facility], [place] [address]  [place], [place] [address]   C. Diff by PCR, Reflexed     Status: Abnormal   Collection Time: 11/04/17  2:53 PM  Result Value Ref Range Status   Toxigenic C. Difficile by PCR POSITIVE (A) NEGATIVE Final    Comment: Positive for toxigenic C. difficile with little to no toxin production. Only treat if clinical presentation suggests symptomatic illness. Performed at [facility], [place] [address], [place], [place] [address]   STOOL CULTURE REFLEX - RSASHR     Status: None   Collection Time: 11/04/17  2:53 PM  Result Value Ref Range Status   Stool Culture result 1 (RSASHR) Comment  Final    Comment: (NOTE) No Salmonella or Shigella recovered. Performed At: [facility] [place], [place] [id number] Rush Farmer MD OA:[id number] Performed at [facility], [place] [address], [place], [place] [address]   MRSA PCR Screening     Status: None   Collection Time: 11/04/17  8:20 PM  Result Value Ref Range Status   MRSA by PCR NEGATIVE NEGATIVE Final    Comment:        The GeneXpert MRSA Assay (FDA approved for NASAL specimens only), is one component of a comprehensive MRSA colonization surveillance program. It is not intended to diagnose MRSA infection nor to guide or monitor treatment for MRSA infections. Performed at [facility], [place] [address] [place], [place] [address]          Radiology Studies: No results found.      Scheduled Meds: . clopidogrel  75 mg Oral Daily  . [START ON 11/09/2017] darbepoetin (ARANESP) injection - DIALYSIS  200 mcg Intravenous Q Wed-HD  . diltiazem  120 mg Oral Daily  . famotidine  20 mg Oral QHS  . feeding supplement (NEPRO CARB STEADY)  237 mL Oral BID BM  . folic acid  1 mg Oral Daily  . gabapentin  100 mg Oral BID  . heparin  5,000 Units Subcutaneous Q8H  . hydrALAZINE  100 mg  Oral BID  . isosorbide mononitrate  30 mg Oral Daily  . mometasone-formoterol  2 puff Inhalation BID  . multivitamin with  minerals  1 tablet Oral Daily  . nicotine  21 mg Transdermal Daily  . pantoprazole  40 mg Oral Daily  . sevelamer carbonate  800 mg Oral Once  . sodium chloride flush  10-40 mL Intracatheter Q12H  . sodium chloride flush  3 mL Intravenous Q12H  . thiamine  100 mg Oral Daily   Or  . thiamine  100 mg Intravenous Daily  . tiotropium  18 mcg Inhalation Daily  . vancomycin  125 mg Oral QID   Continuous Infusions: . sodium chloride    . sodium chloride    . sodium chloride       LOS: 4 days    Time spent: 35 minutes.     Elmarie Shiley, MD [facility] Pager [phone]  If 7PM-7AM, please contact night-coverage [email or web address] Password [facility] 11/08/2017, 2:33 PM

## 2017-11-09 ENCOUNTER — Encounter (HOSPITAL_COMMUNITY): Payer: Self-pay | Admitting: Interventional Radiology

## 2017-11-09 ENCOUNTER — Inpatient Hospital Stay (HOSPITAL_COMMUNITY): Payer: Medicare Other

## 2017-11-09 DIAGNOSIS — N186 End stage renal disease: Secondary | ICD-10-CM

## 2017-11-09 DIAGNOSIS — A0472 Enterocolitis due to Clostridium difficile, not specified as recurrent: Secondary | ICD-10-CM

## 2017-11-09 DIAGNOSIS — B181 Chronic viral hepatitis B without delta-agent: Secondary | ICD-10-CM

## 2017-11-09 DIAGNOSIS — B182 Chronic viral hepatitis C: Secondary | ICD-10-CM

## 2017-11-09 DIAGNOSIS — I120 Hypertensive chronic kidney disease with stage 5 chronic kidney disease or end stage renal disease: Secondary | ICD-10-CM | POA: Diagnosis not present

## 2017-11-09 DIAGNOSIS — F1011 Alcohol abuse, in remission: Secondary | ICD-10-CM

## 2017-11-09 DIAGNOSIS — D631 Anemia in chronic kidney disease: Secondary | ICD-10-CM

## 2017-11-09 DIAGNOSIS — Z992 Dependence on renal dialysis: Secondary | ICD-10-CM

## 2017-11-09 DIAGNOSIS — K652 Spontaneous bacterial peritonitis: Principal | ICD-10-CM

## 2017-11-09 DIAGNOSIS — K769 Liver disease, unspecified: Secondary | ICD-10-CM

## 2017-11-09 DIAGNOSIS — Z515 Encounter for palliative care: Secondary | ICD-10-CM

## 2017-11-09 DIAGNOSIS — Z978 Presence of other specified devices: Secondary | ICD-10-CM

## 2017-11-09 DIAGNOSIS — F17210 Nicotine dependence, cigarettes, uncomplicated: Secondary | ICD-10-CM

## 2017-11-09 DIAGNOSIS — Z66 Do not resuscitate: Secondary | ICD-10-CM

## 2017-11-09 DIAGNOSIS — K7030 Alcoholic cirrhosis of liver without ascites: Secondary | ICD-10-CM

## 2017-11-09 HISTORY — PX: IR PARACENTESIS: IMG2679

## 2017-11-09 LAB — GLUCOSE, CAPILLARY: Glucose-Capillary: 169 mg/dL — ABNORMAL HIGH (ref 65–99)

## 2017-11-09 LAB — BODY FLUID CELL COUNT WITH DIFFERENTIAL
Eos, Fluid: 0 %
Lymphs, Fluid: 7 %
Monocyte-Macrophage-Serous Fluid: 21 % — ABNORMAL LOW (ref 50–90)
Neutrophil Count, Fluid: 72 % — ABNORMAL HIGH (ref 0–25)
Total Nucleated Cell Count, Fluid: 2100 cu mm — ABNORMAL HIGH (ref 0–1000)

## 2017-11-09 LAB — GRAM STAIN

## 2017-11-09 LAB — CULTURE, BLOOD (ROUTINE X 2)
Culture: NO GROWTH
Culture: NO GROWTH
Special Requests: ADEQUATE
Special Requests: ADEQUATE

## 2017-11-09 MED ORDER — LIDOCAINE HCL (PF) 2 % IJ SOLN
INTRAMUSCULAR | Status: AC
  Filled 2017-11-09: qty 20

## 2017-11-09 MED ORDER — HYDROCORTISONE 1 % EX CREA
TOPICAL_CREAM | Freq: Two times a day (BID) | CUTANEOUS | Status: DC | PRN
  Administered 2017-11-09: 23:00:00 via TOPICAL
  Filled 2017-11-09: qty 28

## 2017-11-09 MED ORDER — CEFTAZIDIME 2 G IJ SOLR
2.0000 g | INTRAMUSCULAR | Status: AC
  Administered 2017-11-09: 2 g via INTRAVENOUS
  Filled 2017-11-09: qty 2

## 2017-11-09 MED ORDER — IPRATROPIUM-ALBUTEROL 0.5-2.5 (3) MG/3ML IN SOLN
3.0000 mL | Freq: Two times a day (BID) | RESPIRATORY_TRACT | Status: DC
  Administered 2017-11-09 – 2017-11-11 (×6): 3 mL via RESPIRATORY_TRACT
  Filled 2017-11-09 (×7): qty 3

## 2017-11-09 MED ORDER — LIDOCAINE HCL (PF) 2 % IJ SOLN
INTRAMUSCULAR | Status: DC | PRN
  Administered 2017-11-09: 5 mL

## 2017-11-09 MED ORDER — FLUCONAZOLE IN SODIUM CHLORIDE 400-0.9 MG/200ML-% IV SOLN
400.0000 mg | INTRAVENOUS | Status: DC
  Administered 2017-11-10 – 2017-11-12 (×2): 400 mg via INTRAVENOUS
  Filled 2017-11-09 (×3): qty 200

## 2017-11-09 MED ORDER — ALPRAZOLAM 0.5 MG PO TABS
0.5000 mg | ORAL_TABLET | Freq: Three times a day (TID) | ORAL | Status: DC | PRN
  Administered 2017-11-09 – 2017-11-11 (×7): 0.5 mg via ORAL
  Filled 2017-11-09 (×7): qty 1

## 2017-11-09 MED ORDER — CEFTAZIDIME 2 G IJ SOLR
2.0000 g | INTRAMUSCULAR | Status: DC
  Administered 2017-11-10 – 2017-11-12 (×2): 2 g via INTRAVENOUS
  Filled 2017-11-09 (×3): qty 2

## 2017-11-09 MED ORDER — FLUCONAZOLE IN SODIUM CHLORIDE 400-0.9 MG/200ML-% IV SOLN
400.0000 mg | INTRAVENOUS | Status: AC
  Administered 2017-11-09: 400 mg via INTRAVENOUS
  Filled 2017-11-09: qty 200

## 2017-11-09 NOTE — Consult Note (Signed)
[facility]    Date of Admission:  11/04/2017   Total days of antibiotics: 6 ceftriaxone, vanco po               Reason for Consult: C diff, bacterial peritonitis    Referring Provider: [place]   Assessment: Indeterminant C diff test Bacterial Peritonitis ESRD Hep C (treated) and Hepatitis B  Plan: 1. Change ceftriaxone to ceftaz 2. Add fluconazole 3. Await his peritoneal fluid.  4. Consider stopping po vanco soon  Comment- his C diff test is borderline and he has improved.   Thank you so much for this interesting consult,  Principal Problem:   SBP (spontaneous bacterial peritonitis) ([place]): PRESUMED. Active Problems:   Alcohol abuse   Chronic hepatitis B (HCC)   Chronic hepatitis C without hepatic coma (HCC)   COPD (chronic obstructive pulmonary disease) (HCC)   Anxiety   Anemia due to chronic kidney disease   ESRD (end stage renal disease) (HCC)   Other cirrhosis of liver (HCC)   Abdominal pain   Liver disease, chronic   Hyponatremia   DNR (do not resuscitate)   Palliative care by specialist   . clopidogrel  75 mg Oral Daily  . darbepoetin (ARANESP) injection - DIALYSIS  200 mcg Intravenous Q Wed-HD  . diltiazem  120 mg Oral Daily  . famotidine  20 mg Oral QHS  . feeding supplement (NEPRO CARB STEADY)  237 mL Oral BID BM  . folic acid  1 mg Oral Daily  . gabapentin  100 mg Oral BID  . heparin  5,000 Units Subcutaneous Q8H  . hydrALAZINE  100 mg Oral BID  . ipratropium-albuterol  3 mL Nebulization BID  . isosorbide mononitrate  30 mg Oral Daily  . lidocaine      . mometasone-formoterol  2 puff Inhalation BID  . multivitamin with minerals  1 tablet Oral Daily  . nicotine  21 mg Transdermal Daily  . sodium chloride flush  10-40 mL Intracatheter Q12H  . thiamine  100 mg Oral Daily   Or  . thiamine  100 mg Intravenous Daily  . vancomycin  125 mg Oral QID    HPI: [name] is a 57 y.o. male 57 yo M with  hx of multiple admissions, ESRD, viral and alcohic cirrhosis, adm 4-16 to 4-21 with presumed bacterial peritonitis (Cx -). He was treated with fortaz which was to continue at HD for an additional week after d/c.  He returns on 4-26 with low grade temp, abd pain and loose stool. His WBC was 14k, He was found to have C diff Ag+/Toxin- on his stool test. There was also concern for repeat SBP (abd pain, chills).  He was started on ceftriaxone and oral vanco.  By 4-30 he was to complete his treatement for SBP however he still had abd pain. He had repeat paracentesis ordered which was performed today (WBC 2100, 72% N. Cx pending) .  His WBC has improved to 9.8. He has been afebrile in the hospital.   Review of Systems: Review of Systems  Constitutional: Negative for chills and fever.  Gastrointestinal: Negative for constipation and diarrhea.  Genitourinary: Negative for dysuria.  makes small amt of urine.  2 BM today, more firm.  Please see HPI. All other systems reviewed and negative.   Past Medical History:  Diagnosis Date  . Adenomatous colon polyp    tubular  . Anemia   . Anxiety   .  Arthritis    left shoulder  . Atherosclerosis of aorta ([place])   . Cardiomegaly   . Chest pain    DATE UNKNOWN, C/O PERIODICALLY  . Cocaine abuse ([place])   . COPD exacerbation ([place]) 08/17/2016  . Coronary artery disease    stent 02/22/17  . Dialysis patient ([place])    Monday-Wednesday-Friday  . ESRD (end stage renal disease) on dialysis ([place])    "[name]; MWF" (07/04/2017)  . GERD (gastroesophageal reflux disease)    DATE UNKNOWN  . Hemorrhoids   . Hepatitis B, chronic ([place])   . Hepatitis C   . History of kidney stones   . Hyperkalemia   . Hypertension   . Metabolic bone disease    Patient denies  . Mitral stenosis   . Myocardial infarction ([place])   . Pneumonia   . Pulmonary edema   . Renal disorder   . Renal insufficiency   . Shortness of breath dyspnea    " for the last past year with this  dialysis"  . Solitary rectal ulcer syndrome   . Tubular adenoma of colon     Social History   Tobacco Use  . Smoking status: Current Every Day Smoker    Packs/day: 0.50    Years: 43.00    Pack years: 21.50    Types: Cigarettes    Start date: 08/13/1973  . Smokeless tobacco: Never Used  Substance Use Topics  . Alcohol use: Not Currently    Frequency: Never    Comment: quit drinking in 2017  . Drug use: Not Currently    Comment: quit in 2017"    Family History  Problem Relation Age of Onset  . Heart disease Mother   . Lung cancer Mother   . Heart disease Father   . Malignant hyperthermia Father   . COPD Father   . Throat cancer Sister   . Esophageal cancer Sister   . Hypertension Other   . COPD Other   . Colon cancer Neg Hx   . Colon polyps Neg Hx   . Rectal cancer Neg Hx   . Stomach cancer Neg Hx      Medications:  Scheduled: . clopidogrel  75 mg Oral Daily  . darbepoetin (ARANESP) injection - DIALYSIS  200 mcg Intravenous Q Wed-HD  . diltiazem  120 mg Oral Daily  . famotidine  20 mg Oral QHS  . feeding supplement (NEPRO CARB STEADY)  237 mL Oral BID BM  . folic acid  1 mg Oral Daily  . gabapentin  100 mg Oral BID  . heparin  5,000 Units Subcutaneous Q8H  . hydrALAZINE  100 mg Oral BID  . ipratropium-albuterol  3 mL Nebulization BID  . isosorbide mononitrate  30 mg Oral Daily  . lidocaine      . mometasone-formoterol  2 puff Inhalation BID  . multivitamin with minerals  1 tablet Oral Daily  . nicotine  21 mg Transdermal Daily  . sodium chloride flush  10-40 mL Intracatheter Q12H  . thiamine  100 mg Oral Daily   Or  . thiamine  100 mg Intravenous Daily  . vancomycin  125 mg Oral QID    Abtx:  Anti-infectives (From admission, onward)   Start     Dose/Rate Route Frequency Ordered Stop   11/06/17 0930  cefTRIAXone (ROCEPHIN) 2 g in sodium chloride 0.9 % 100 mL IVPB  Status:  Discontinued     2 g 200 mL/hr over 30 Minutes Intravenous Every 24 hours  11/05/17 1413 11/07/17 1412   11/05/17 0800  cefTRIAXone (ROCEPHIN) 2 g in sodium chloride 0.9 % 100 mL IVPB  Status:  Discontinued     2 g 200 mL/hr over 30 Minutes Intravenous Every 24 hours 11/04/17 0426 11/05/17 1409   11/04/17 1900  vancomycin (VANCOCIN) 50 mg/mL oral solution 125 mg     125 mg Oral 4 times daily 11/04/17 1859 11/14/17 1759   11/04/17 0445  cefTRIAXone (ROCEPHIN) 1 g in sodium chloride 0.9 % 100 mL IVPB     1 g 200 mL/hr over 30 Minutes Intravenous  Once 11/04/17 0426 11/04/17 0819   11/04/17 0330  cefTRIAXone (ROCEPHIN) 1 g in sodium chloride 0.9 % 100 mL IVPB     1 g 200 mL/hr over 30 Minutes Intravenous  Once 11/04/17 0315 11/04/17 0506        OBJECTIVE: Blood pressure 114/76, pulse (!) 119, temperature 98 F (36.7 C), temperature source Oral, resp. rate 18, height _0  (1.753 m), weight 69.2 kg (152 lb 8 oz), SpO2 97 %.  Physical Exam  Constitutional: He appears well-developed and well-nourished.  HENT:  Mouth/Throat: Oropharynx is clear and moist.  Eyes: Pupils are equal, round, and reactive to light. EOM are normal.  Cardiovascular: Normal rate, regular rhythm and normal heart sounds.  Pulmonary/Chest: Effort normal and breath sounds normal.  Abdominal: Soft. Bowel sounds are normal. He exhibits distension. There is no tenderness.  no LE edema He has LUE AVG with no bruit. He has RUE AVG with + bruit.  He has R chest HD line clean.   Lab Results Results for orders placed or performed during the hospital encounter of 11/04/17 (from the past 48 hour(s))  Glucose, capillary     Status: Abnormal   Collection Time: 11/07/17  4:59 PM  Result Value Ref Range   Glucose-Capillary 102 (H) 65 - 99 mg/dL  Renal function panel     Status: Abnormal   Collection Time: 11/08/17  7:28 AM  Result Value Ref Range   Sodium 129 (L) 135 - 145 mmol/L   Potassium 5.4 (H) 3.5 - 5.1 mmol/L   Chloride 87 (L) 101 - 111 mmol/L   CO2 25 22 - 32 mmol/L   Glucose, Bld 95 65  - 99 mg/dL   BUN 59 (H) 6 - 20 mg/dL   Creatinine, Ser 8.28 (H) 0.61 - 1.24 mg/dL   Calcium 9.1 8.9 - 10.3 mg/dL   Phosphorus 4.5 2.5 - 4.6 mg/dL   Albumin 3.0 (L) 3.5 - 5.0 g/dL   GFR calc non Af Amer 6 (L) >60 mL/min   GFR calc Af Amer 7 (L) >60 mL/min    Comment: (NOTE) The eGFR has been calculated using the CKD EPI equation. This calculation has not been validated in all clinical situations. eGFR's persistently <60 mL/min signify possible Chronic Kidney Disease.    Anion gap 17 (H) 5 - 15    Comment: Performed at [facility], [place] [address], [place], [place] [address]  CBC     Status: Abnormal   Collection Time: 11/08/17  7:28 AM  Result Value Ref Range   WBC 9.8 4.0 - 10.5 K/uL   RBC 3.52 (L) 4.22 - 5.81 MIL/uL   Hemoglobin 8.9 (L) 13.0 - 17.0 g/dL   HCT 26.2 (L) 39.0 - 52.0 %   MCV 74.4 (L) 78.0 - 100.0 fL   MCH 25.3 (L) 26.0 - 34.0 pg   MCHC 34.0 30.0 - 36.0 g/dL  RDW 21.3 (H) 11.5 - 15.5 %   Platelets 155 150 - 400 K/uL    Comment: Performed at [facility], [place] [address], [place], [place] [address]  Glucose, capillary     Status: Abnormal   Collection Time: 11/09/17  8:01 AM  Result Value Ref Range   Glucose-Capillary 169 (H) 65 - 99 mg/dL  Body fluid cell count with differential     Status: Abnormal   Collection Time: 11/09/17  2:45 PM  Result Value Ref Range   Fluid Type-FCT Peritoneal    Color, Fluid RED (A) YELLOW   Appearance, Fluid CLOUDY (A) CLEAR   WBC, Fluid 2,100 (H) 0 - 1,000 cu mm   Neutrophil Count, Fluid 72 (H) 0 - 25 %   Lymphs, Fluid 7 %   Monocyte-Macrophage-Serous Fluid 21 (L) 50 - 90 %   Eos, Fluid 0 %   Other Cells, Fluid MESOTHELIAL CELLS %    Comment: Performed at [facility], [place] [address], [place], [place] [address]      Component Value Date/Time   SDES BLOOD RIGHT HAND 11/04/2017 0829   SPECREQUEST  11/04/2017 0829    BOTTLES DRAWN AEROBIC AND ANAEROBIC Blood Culture adequate volume   CULT  11/04/2017 0829     NO GROWTH 5 DAYS Performed at [facility], [place] [address], [place], [place] [address]    REPTSTATUS 11/09/2017 FINAL 11/04/2017 0829   Dg Chest Port 1 View  Result Date: 11/08/2017 CLINICAL DATA:  Shortness of breath.  Smoker EXAM: PORTABLE CHEST 1 VIEW COMPARISON:  11/04/2017 FINDINGS: Decreased inspiration. Stable enlarged cardiac silhouette. The right jugular double-lumen catheter tips remain in the right atrium, currently in the mid to lower right atrium. Stable prominence of the interstitial markings and mild prominence of the pulmonary vasculature. Minimal left basilar linear atelectasis and possible small left pleural effusion. Diffuse osteopenia. Atheromatous arterial calcifications. IMPRESSION: 1. Decreased inspiration with stable mild elevation of the left hemidiaphragm and interval minimal left basilar linear atelectasis or scarring and possible small left pleural effusion. 2. Stable cardiomegaly, mild pulmonary vascular congestion and mild chronic interstitial lung disease. Electronically Signed   By: Claudie Revering M.D.   On: 11/08/2017 15:38   Ir Abdomen US Limited  Result Date: 11/09/2017 CLINICAL DATA:  History of cirrhosis with recurrent symptomatic ascites. Please perform ultrasound-guided paracentesis for therapeutic purposes. EXAM: LIMITED ABDOMEN ULTRASOUND FOR ASCITES TECHNIQUE: Limited ultrasound survey for ascites was performed in all four abdominal quadrants. COMPARISON:  Ascites search ultrasound-11/04/2017; ultrasound-guided paracentesis-10/28/2017; CT abdomen and pelvis - 10/25/2017 FINDINGS: Sonographic evaluation of the abdomen demonstrates a small amount of predominantly perihepatic intra-abdominal ascites. While the amount of fluid is likely amenable to ultrasound-guided paracentesis, the patient refused the procedure and as such, no procedure was performed. IMPRESSION: Small amount of intra-abdominal ascites, potentially amenable to ultrasound-guided paracentesis  however the present time, the patient refuses to undergo the paracentesis Referring physician was made aware of patient's request. Electronically Signed   By: Sandi Mariscal M.D.   On: 11/09/2017 10:20   Ir Paracentesis  Result Date: 11/09/2017 INDICATION: Recurrent symptomatic ascites. Please perform ultrasound-guided paracentesis for diagnostic and therapeutic purposes. EXAM: ULTRASOUND-GUIDED PARACENTESIS COMPARISON:  Ultrasound-guided paracentesis - 10/28/2017 CT abdomen and pelvis - 10/25/2017 MEDICATIONS: None. COMPLICATIONS: None immediate. TECHNIQUE: Informed written consent was obtained from the patient after a discussion of the risks, benefits and alternatives to treatment. A timeout was performed prior to the initiation of the procedure. Initial ultrasound scanning  demonstrates a small to moderate amount of ascites within the right lower abdominal quadrant. The right lower abdomen was prepped and draped in the usual sterile fashion. 1% lidocaine with epinephrine was used for local anesthesia. Under direct ultrasound guidance, a 19 gauge, 7-cm, Yueh catheter was introduced. An ultrasound image was saved for documentation purposed. The paracentesis was performed. The catheter was removed and a dressing was applied. The patient tolerated the procedure well without immediate post procedural complication. FINDINGS: A total of approximately 3 liters of serous fluid was removed. Samples were sent to the laboratory as requested by the clinical team. IMPRESSION: Successful ultrasound-guided paracentesis yielding 3 liters of peritoneal fluid. Electronically Signed   By: Sandi Mariscal M.D.   On: 11/09/2017 15:43   Recent Results (from the past 240 hour(s))  Culture, blood (routine x 2)     Status: None   Collection Time: 11/04/17  8:20 AM  Result Value Ref Range Status   Specimen Description BLOOD RIGHT ARM  Final   Special Requests   Final    BOTTLES DRAWN AEROBIC ONLY Blood Culture adequate volume   Culture    Final    NO GROWTH 5 DAYS Performed at [facility], [address] [place], [place] [address]    Report Status 11/09/2017 FINAL  Final  Culture, blood (routine x 2)     Status: None   Collection Time: 11/04/17  8:29 AM  Result Value Ref Range Status   Specimen Description BLOOD RIGHT HAND  Final   Special Requests   Final    BOTTLES DRAWN AEROBIC AND ANAEROBIC Blood Culture adequate volume   Culture   Final    NO GROWTH 5 DAYS Performed at [facility], [place] [address], [place], [place] [address]    Report Status 11/09/2017 FINAL  Final  C difficile quick scan w PCR reflex     Status: Abnormal   Collection Time: 11/04/17  2:53 PM  Result Value Ref Range Status   C Diff antigen POSITIVE (A) NEGATIVE Final   C Diff toxin NEGATIVE NEGATIVE Final   C Diff interpretation Results are indeterminate. See PCR results.  Final  Stool culture (children & immunocomp patients)     Status: None   Collection Time: 11/04/17  2:53 PM  Result Value Ref Range Status   Salmonella/Shigella Screen Final report  Final   Campylobacter Culture Final report  Final   E coli, Shiga toxin Assay Negative Negative Final    Comment: (NOTE) Performed At: [facility] [place], [place] [id number] Rush Farmer MD QH:[id number] Performed at [facility], [place] [address], [place], [place] [address]   C. Diff by PCR, Reflexed     Status: Abnormal   Collection Time: 11/04/17  2:53 PM  Result Value Ref Range Status   Toxigenic C. Difficile by PCR POSITIVE (A) NEGATIVE Final    Comment: Positive for toxigenic C. difficile with little to no toxin production. Only treat if clinical presentation suggests symptomatic illness. Performed at [facility], [place] [address] [place], [place] [address]   STOOL CULTURE REFLEX - RSASHR     Status: None   Collection Time: 11/04/17  2:53 PM  Result Value Ref Range Status   Stool Culture result 1 (RSASHR) Comment  Final     Comment: (NOTE) No Salmonella or Shigella recovered. Performed At: [facility] [place], [place] [id number] Rush Farmer MD [id number] Performed at [facility], [place] [address] [place], [place] [address]   STOOL CULTURE Reflex - CMPCXR     Status: None   Collection Time: 11/04/17  2:53 PM  Result Value Ref Range Status   Stool Culture result 1 (CMPCXR) Comment  Final    Comment: (NOTE) No Campylobacter species isolated. Performed At: [facility] [place], [place] [id number] Rush Farmer MD [id number] Performed at [facility], [place] [address], [place], [place] [address]   MRSA PCR Screening     Status: None   Collection Time: 11/04/17  8:20 PM  Result Value Ref Range Status   MRSA by PCR NEGATIVE NEGATIVE Final    Comment:        The GeneXpert MRSA Assay (FDA approved for NASAL specimens only), is one component of a comprehensive MRSA colonization surveillance program. It is not intended to diagnose MRSA infection nor to guide or monitor treatment for MRSA infections. Performed at [facility], [place] [address] [place], [place] [address]     Microbiology: Recent Results (from the past 240 hour(s))  Culture, blood (routine x 2)     Status: None   Collection Time: 11/04/17  8:20 AM  Result Value Ref Range Status   Specimen Description BLOOD RIGHT ARM  Final   Special Requests   Final    BOTTLES DRAWN AEROBIC ONLY Blood Culture adequate volume   Culture   Final    NO GROWTH 5 DAYS Performed at [facility], [address], [place], [place] [address]    Report Status 11/09/2017 FINAL  Final  Culture, blood (routine x 2)     Status: None   Collection Time: 11/04/17  8:29 AM  Result Value Ref Range Status   Specimen Description BLOOD RIGHT HAND  Final   Special Requests   Final    BOTTLES DRAWN AEROBIC AND ANAEROBIC Blood Culture adequate volume   Culture   Final    NO GROWTH 5  DAYS Performed at [facility], [place] [address] [place], [place] [address]    Report Status 11/09/2017 FINAL  Final  C difficile quick scan w PCR reflex     Status: Abnormal   Collection Time: 11/04/17  2:53 PM  Result Value Ref Range Status   C Diff antigen POSITIVE (A) NEGATIVE Final   C Diff toxin NEGATIVE NEGATIVE Final   C Diff interpretation Results are indeterminate. See PCR results.  Final  Stool culture (children & immunocomp patients)     Status: None   Collection Time: 11/04/17  2:53 PM  Result Value Ref Range Status   Salmonella/Shigella Screen Final report  Final   Campylobacter Culture Final report  Final   E coli, Shiga toxin Assay Negative Negative Final    Comment: (NOTE) Performed At: [facility] [place], [place] [id number] Rush Farmer MD QQ:[id number] Performed at [facility], [place] [address] [place], [place] [address]   C. Diff by PCR, Reflexed     Status: Abnormal   Collection Time: 11/04/17  2:53 PM  Result Value Ref Range Status   Toxigenic C. Difficile by PCR POSITIVE (A) NEGATIVE Final    Comment: Positive for toxigenic C. difficile with little to no toxin production. Only treat if clinical presentation suggests symptomatic illness. Performed at [facility], [place] [address], [place], [place] [address]   STOOL CULTURE REFLEX - RSASHR     Status: None  Collection Time: 11/04/17  2:53 PM  Result Value Ref Range Status   Stool Culture result 1 (RSASHR) Comment  Final    Comment: (NOTE) No Salmonella or Shigella recovered. Performed At: [facility] [place], [place] [id number] Rush Farmer MD JK:[id number] Performed at [facility], [place] [address] [place], [place] [address]   STOOL CULTURE Reflex - CMPCXR     Status: None   Collection Time: 11/04/17  2:53 PM  Result Value Ref Range Status   Stool Culture result 1 (CMPCXR) Comment  Final    Comment: (NOTE) No Campylobacter  species isolated. Performed At: [facility] [place], [place] [id number] Rush Farmer MD HV:[id number] Performed at [facility], [place] [address], [place], [place] [address]   MRSA PCR Screening     Status: None   Collection Time: 11/04/17  8:20 PM  Result Value Ref Range Status   MRSA by PCR NEGATIVE NEGATIVE Final    Comment:        The GeneXpert MRSA Assay (FDA approved for NASAL specimens only), is one component of a comprehensive MRSA colonization surveillance program. It is not intended to diagnose MRSA infection nor to guide or monitor treatment for MRSA infections. Performed at [facility], [place] [address], [place], [place] [address]     Radiographs and labs were personally reviewed by me.   Bobby Rumpf, MD [facility] [phone] 11/09/2017, 4:31 PM

## 2017-11-09 NOTE — H&P (View-Only) (Signed)
Patient is a 57 year old male admitted with C. difficile colitis as well as complications of cirrhosis.  I was asked to see him by the [facility] hospitalist service for follow-up on his recent left and right arm operations related to hemodialysis access.  He had an infiltration of his left arm access which resulted in a large wound eventually requiring skin grafting.  He had a new right brachiocephalic AV fistula placed about 3 months ago.  He currently is dialyzing via right sided catheter.  Review of systems: He currently denies fever or chills.  He denies shortness of breath or chest pain.  Physical exam:  Vitals:   11/09/17 0900 11/09/17 1337 11/09/17 1500 11/09/17 1511  BP: (!) 144/97 (!) 150/98 136/75 114/76  Pulse:  (!) 119    Resp:  18    Temp:  98 F (36.7 C)    TempSrc:  Oral    SpO2:  97%    Weight:      Height:        Left upper extremity: Well-healed left upper arm skin graft  Right upper extremity: Palpable thrill AV fistula right arm mid to distal section is not well-developed about 3 mm in diameter.  Proximal section is about 5 to 6 mm in diameter with abundant collaterals.  Assessment: Poorly maturing AV fistula right arm  Plan: Patient eventually needs a fistulogram possible intervention on his right arm with either sidebranch ligation or possibly angioplasty or revision.  However, since he is currently admitted with C. difficile colitis and other infectious issues I would prefer to defer this until after he has improved.  I will schedule him for a right arm fistulogram possible intervention next week.  Our office will contact him regarding times dates and places.  Ruta Hinds, MD [facility] Office: [phone] Pager: [phone]

## 2017-11-09 NOTE — Progress Notes (Signed)
ANTIBIOTIC CONSULT NOTE - INITIAL  Pharmacy Consult for Fluconazole, [name] Indication: SBP  Allergies  Allergen Reactions  . Aspirin Other (See Comments)    STOMACH PAIN  . Tylenol [Acetaminophen]     Stomach ache  . Clonidine Derivatives Itching  . Tramadol Itching    Patient Measurements: Height: 5\' 9"  (175.3 cm) Weight: 152 lb 8 oz (69.2 kg) IBW/kg (Calculated) : 70.7 Adjusted Body Weight:    Vital Signs: Temp: 98 F (36.7 C) (05/01 1337) Temp Source: Oral (05/01 1337) BP: 114/76 (05/01 1511) Pulse Rate: 119 (05/01 1337) Intake/Output from previous day: 04/30 0701 - 05/01 0700 In: 200 [P.O.:200] Out: 1800  Intake/Output from this shift: Total I/O In: 457 [P.O.:457] Out: -   Labs: Recent Labs    11/08/17 0728  WBC 9.8  HGB 8.9*  PLT 155  CREATININE 8.28*   Estimated Creatinine Clearance: 9.9 mL/min (A) (by C-G formula based on SCr of 8.28 mg/dL (H)). No results for input(s): VANCOTROUGH, VANCOPEAK, VANCORANDOM, GENTTROUGH, GENTPEAK, GENTRANDOM, TOBRATROUGH, TOBRAPEAK, TOBRARND, AMIKACINPEAK, AMIKACINTROU, AMIKACIN in the last 72 hours.   Microbiology:   Medical History: Past Medical History:  Diagnosis Date  . Adenomatous colon polyp    tubular  . Anemia   . Anxiety   . Arthritis    left shoulder  . Atherosclerosis of aorta ([place])   . Cardiomegaly   . Chest pain    DATE UNKNOWN, C/O PERIODICALLY  . Cocaine abuse ([place])   . COPD exacerbation ([place]) 08/17/2016  . Coronary artery disease    stent 02/22/17  . Dialysis patient ([facility])    Monday-Wednesday-Friday  . ESRD (end stage renal disease) on dialysis ([place])    "[name]; MWF" (07/04/2017)  . GERD (gastroesophageal reflux disease)    DATE UNKNOWN  . Hemorrhoids   . Hepatitis B, chronic ([place])   . Hepatitis C   . History of kidney stones   . Hyperkalemia   . Hypertension   . Metabolic bone disease    Patient denies  . Mitral stenosis   . Myocardial infarction ([place])   . Pneumonia   .  Pulmonary edema   . Renal disorder   . Renal insufficiency   . Shortness of breath dyspnea    " for the last past year with this dialysis"  . Solitary rectal ulcer syndrome   . Tubular adenoma of colon     Assessment:  ID: s/p IV rocephin for presumed SBP (was on cefepime as outpt for cx-neg peritonitis) and oral vanc for cdiff >>change abx to Ceftaz/Fluconazole for SBP. Tmax 99.1. WBC 9.8. Dose for ESRD  4/19 Peritoneal fluid: neg 4/26: Toxigenic Cdiff pos  4/26 stool: neg EColi and shiga toxin, Salmonella, campylobacter ip 4/26 BCx: negative  Rocephin 4/26>4/29 [name] 5/1>> Fluconazole 5/1>>  Goal of Therapy:  Eradication of infection  Plan:  [name] 2g/HD. First dose now, then again with HD "off schedule" tomorrow Fluconazole 400mg  IV q HD. First dose now then again with HD tomorrow.  Joshua Yanes S. Alford Highland, PharmD, BCPS Clinical Staff Pharmacist Pager [phone]  Eilene Ghazi Stillinger 11/09/2017,5:19 PM

## 2017-11-09 NOTE — Telephone Encounter (Signed)
Patient is still in the hospital as of today.

## 2017-11-09 NOTE — Progress Notes (Addendum)
PROGRESS NOTE   [name]  UEA:[id number]    DOB: 1961-06-05    DOA: 11/04/2017  PCP: [name], MD   I have briefly reviewed patients previous medical records in [facility].  Brief Narrative:  57 year old male with extensive and complex PMH: ESRD on HD, anemia, alcoholism, chronic hepatitis B & C complicated by cirrhosis and ascites, COPD, chronic diastolic CHF by echo 4/78/2956 with dilated RV, CAD status post RCA stent 02/22/2017, severe pulmonary hypertension with very dilated RV by echo, PAF, frequent hospitalizations (12 in the last 6 months) recently admitted for abdominal pain, SBP, negative cultures, presented with recurrent abdominal pain and diagnosed with C. difficile colitis this admission.  Nephrology consulted for dialysis needs.  Palliative team on board for goals of care.  Status post paracentesis 3 L by IR on 5/1.   Assessment & Plan:   Principal Problem:   SBP (spontaneous bacterial peritonitis) ([place]): PRESUMED. Active Problems:   Alcohol abuse   Chronic hepatitis B (H[facility])   Chronic hepatitis C without hepatic coma (HCC)   COPD (chronic obstructive pulmonary disease) (H[facility])   Anxiety   Anemia due to chronic kidney disease   ESRD (end stage renal disease) ([facility])   Other cirrhosis of liver (HCC)   Abdominal pain   Liver disease, chronic   Hyponatremia   DNR (do not resuscitate)   Palliative care by specialist   C. difficile colitis: Continue oral vancomycin and minimize use of other antibiotics or PPIs as much as possible.  Improving.  Alcoholic, hep B & C cirrhosis with ascites/SBP: Status post recent antibiotics.  Underwent ultrasound-guided paracentesis 3 L on 11/09/2017.  Preliminary ascitic fluid results show 2100 WBCs predominantly neutrophilic, concern regarding persistent SBP.  Difficult situation given C. difficile diarrhea.  ID consulted for assistance.  ESRD on MWF HD: Nephrology following.  Last HD 4/30.  Essential hypertension:  Mildly controlled at times.  Continue diltiazem, hydralazine, Imdur  CAD status post stent to RCA: Asymptomatic of chest pain.  Remains on Plavix and Imdur.  Severe pulmonary artery hypertension  Paroxysmal A. fib: Continue diltiazem.  Dyspnea: Noted on 4/30.  Chest x-ray showed atelectasis and mild pulmonary congestion.  Volume management across dialysis.  Anxiety could also be the cause.  Had been empirically started on IV Solu-Medrol but no clinical bronchospasm and hence will discontinue.  Remains on as needed Xanax.  Dyspnea resolved.  Anemia of ESRD: Stable.  COPD: Stable without clinical bronchospasm.  Pain: Controlled at this time.  Adult failure to thrive: Multifactorial due to multiple severe significant comorbidities.  Palliative care consulted on 4/30.  Changed to DNR.  Xanax was increased for significant anxiety.  He then allowed paracentesis which she had declined earlier.  DVT prophylaxis: Subcutaneous heparin Code Status: DNR Family Communication: None at bedside Disposition: To be determined pending clinical improvement.   Consultants:  Nephrology IR Palliative care medicine  Procedures:  Ultrasound-guided paracentesis by IR 5/1 HD  Antimicrobials:  Vancomycin   Subjective: Seen this morning prior to procedure.  Reported anxiety and had just returned from radiology after declining paracentesis.  No abdominal pain or dyspnea reported.  States that he had one soft BM this morning and a couple all day yesterday but much better than before.  Asked me to contact his vascular surgeon regarding a procedure that he is supposed to have tomorrow, contacted Dr. Oneida Alar  ROS: As above  Objective:  Vitals:   11/09/17 0900 11/09/17 1337 11/09/17 1500 11/09/17 1511  BP: Marland Kitchen)  144/97 (!) 150/98 136/75 114/76  Pulse:  (!) 119    Resp:  18    Temp:  98 F (36.7 C)    TempSrc:  Oral    SpO2:  97%    Weight:      Height:        Examination:  General exam: Pleasant  young male, moderately built and nourished lying comfortably propped up in bed. Respiratory system: Diminished breath sounds in the bases but otherwise clear to auscultation.  No increased work of breathing.  Right TDC. Cardiovascular system: S1 & S2 heard, RRR. No JVD, murmurs, rubs, gallops or clicks. No pedal edema.   Gastrointestinal system: Abdomen is distended, soft and nontender. No organomegaly or masses felt. Normal bowel sounds heard.  Ascites + +. Central nervous system: Alert and oriented. No focal neurological deficits. Extremities: Symmetric 5 x 5 power.  Left upper extremity AV fistula. Skin: No rashes, lesions or ulcers Psychiatry: Judgement and insight appear normal. Mood & affect appropriate.     Data Reviewed: I have personally reviewed following labs and imaging studies  CBC: Recent Labs  Lab 11/03/17 1102 11/04/17 0148 11/05/17 0651 11/06/17 0434 11/08/17 0728  WBC 13.5* 14.1* 12.9* 11.1* 9.8  NEUTROABS  --  11.6* 9.7*  --   --   HGB 10.7* 9.7* 9.4* 8.9* 8.9*  HCT 31.4* 28.9* 27.9* 26.2* 26.2*  MCV 76.6* 76.7* 75.0* 75.7* 74.4*  PLT 192 158 156 130* 482   Basic Metabolic Panel: Recent Labs  Lab 11/04/17 0148 11/04/17 1727 11/05/17 0651 11/06/17 0434 11/08/17 0728  NA 127* 129* 125* 131* 129*  K 5.1 5.4* 5.3* 4.4 5.4*  CL 86* 91* 89* 90* 87*  CO2 24 23 22 24 25   GLUCOSE 101* 120* 79 114* 95  BUN 38* 50* 60* 36* 59*  CREATININE 5.92* 6.87* 7.72* 5.47* 8.28*  CALCIUM 8.9 8.9 9.0 8.4* 9.1  MG  --   --  2.4  --   --   PHOS  --   --   --   --  4.5   Liver Function Tests: Recent Labs  Lab 11/03/17 1102 11/04/17 1727 11/05/17 0651 11/08/17 0728  AST 47* 28 22  --   ALT 48 32 27  --   ALKPHOS 194* 181* 180*  --   BILITOT 1.0 1.3* 1.3*  --   PROT 6.7 7.0 6.8  --   ALBUMIN 3.1* 3.3* 3.2* 3.0*   Coagulation Profile: Recent Labs  Lab 11/05/17 0651  INR 1.32   CBG: Recent Labs  Lab 11/05/17 2003 11/06/17 0815 11/07/17 0909 11/07/17 1659  11/09/17 0801  GLUCAP 137* 118* 104* 102* 169*    Recent Results (from the past 240 hour(s))  Culture, blood (routine x 2)     Status: None   Collection Time: 11/04/17  8:20 AM  Result Value Ref Range Status   Specimen Description BLOOD RIGHT ARM  Final   Special Requests   Final    BOTTLES DRAWN AEROBIC ONLY Blood Culture adequate volume   Culture   Final    NO GROWTH 5 DAYS Performed at [facility], [address] [place], [place] [address]    Report Status 11/09/2017 FINAL  Final  Culture, blood (routine x 2)     Status: None   Collection Time: 11/04/17  8:29 AM  Result Value Ref Range Status   Specimen Description BLOOD RIGHT HAND  Final   Special Requests   Final  BOTTLES DRAWN AEROBIC AND ANAEROBIC Blood Culture adequate volume   Culture   Final    NO GROWTH 5 DAYS Performed at [facility], [place] [address] [place], [place] [address]    Report Status 11/09/2017 FINAL  Final  C difficile quick scan w PCR reflex     Status: Abnormal   Collection Time: 11/04/17  2:53 PM  Result Value Ref Range Status   C Diff antigen POSITIVE (A) NEGATIVE Final   C Diff toxin NEGATIVE NEGATIVE Final   C Diff interpretation Results are indeterminate. See PCR results.  Final  Stool culture (children & immunocomp patients)     Status: None   Collection Time: 11/04/17  2:53 PM  Result Value Ref Range Status   Salmonella/Shigella Screen Final report  Final   Campylobacter Culture Final report  Final   E coli, Shiga toxin Assay Negative Negative Final    Comment: (NOTE) Performed At: [facility] [place], [place] [id number] Rush Farmer MD [id number] Performed at [facility], [place] [address], [place], [place] [address]   C. Diff by PCR, Reflexed     Status: Abnormal   Collection Time: 11/04/17  2:53 PM  Result Value Ref Range Status   Toxigenic C. Difficile by PCR POSITIVE (A) NEGATIVE Final    Comment: Positive for toxigenic C.  difficile with little to no toxin production. Only treat if clinical presentation suggests symptomatic illness. Performed at [facility], [place] [address] [place], [place] [address]   STOOL CULTURE REFLEX - RSASHR     Status: None   Collection Time: 11/04/17  2:53 PM  Result Value Ref Range Status   Stool Culture result 1 (RSASHR) Comment  Final    Comment: (NOTE) No Salmonella or Shigella recovered. Performed At: [facility] [place], [place] [id number] Rush Farmer MD [id number] Performed at [facility], [place] [address], [place], [place] [address]   STOOL CULTURE Reflex - CMPCXR     Status: None   Collection Time: 11/04/17  2:53 PM  Result Value Ref Range Status   Stool Culture result 1 (CMPCXR) Comment  Final    Comment: (NOTE) No Campylobacter species isolated. Performed At: [facility] [place], [place] [id number] Rush Farmer MD [id number] Performed at [facility], [place] [address], [place], [place] [address]   MRSA PCR Screening     Status: None   Collection Time: 11/04/17  8:20 PM  Result Value Ref Range Status   MRSA by PCR NEGATIVE NEGATIVE Final    Comment:        The GeneXpert MRSA Assay (FDA approved for NASAL specimens only), is one component of a comprehensive MRSA colonization surveillance program. It is not intended to diagnose MRSA infection nor to guide or monitor treatment for MRSA infections. Performed at [facility], [place] [address] [place], [place] [address]          Radiology Studies: Dg Chest Port 1 View  Result Date: 11/08/2017 CLINICAL DATA:  Shortness of breath.  Smoker EXAM: PORTABLE CHEST 1 VIEW COMPARISON:  11/04/2017 FINDINGS: Decreased inspiration. Stable enlarged cardiac silhouette. The right jugular double-lumen catheter tips remain in the right atrium, currently in the mid to lower right atrium. Stable prominence of the interstitial markings and mild  prominence of the pulmonary vasculature. Minimal left basilar linear atelectasis and possible small left pleural effusion. Diffuse osteopenia. Atheromatous arterial calcifications. IMPRESSION: 1.  Decreased inspiration with stable mild elevation of the left hemidiaphragm and interval minimal left basilar linear atelectasis or scarring and possible small left pleural effusion. 2. Stable cardiomegaly, mild pulmonary vascular congestion and mild chronic interstitial lung disease. Electronically Signed   By: Claudie Revering M.D.   On: 11/08/2017 15:38   Ir Abdomen US Limited  Result Date: 11/09/2017 CLINICAL DATA:  History of cirrhosis with recurrent symptomatic ascites. Please perform ultrasound-guided paracentesis for therapeutic purposes. EXAM: LIMITED ABDOMEN ULTRASOUND FOR ASCITES TECHNIQUE: Limited ultrasound survey for ascites was performed in all four abdominal quadrants. COMPARISON:  Ascites search ultrasound-11/04/2017; ultrasound-guided paracentesis-10/28/2017; CT abdomen and pelvis - 10/25/2017 FINDINGS: Sonographic evaluation of the abdomen demonstrates a small amount of predominantly perihepatic intra-abdominal ascites. While the amount of fluid is likely amenable to ultrasound-guided paracentesis, the patient refused the procedure and as such, no procedure was performed. IMPRESSION: Small amount of intra-abdominal ascites, potentially amenable to ultrasound-guided paracentesis however the present time, the patient refuses to undergo the paracentesis Referring physician was made aware of patient's request. Electronically Signed   By: Sandi Mariscal M.D.   On: 11/09/2017 10:20   Ir Paracentesis  Result Date: 11/09/2017 INDICATION: Recurrent symptomatic ascites. Please perform ultrasound-guided paracentesis for diagnostic and therapeutic purposes. EXAM: ULTRASOUND-GUIDED PARACENTESIS COMPARISON:  Ultrasound-guided paracentesis - 10/28/2017 CT abdomen and pelvis - 10/25/2017 MEDICATIONS: None. COMPLICATIONS:  None immediate. TECHNIQUE: Informed written consent was obtained from the patient after a discussion of the risks, benefits and alternatives to treatment. A timeout was performed prior to the initiation of the procedure. Initial ultrasound scanning demonstrates a small to moderate amount of ascites within the right lower abdominal quadrant. The right lower abdomen was prepped and draped in the usual sterile fashion. 1% lidocaine with epinephrine was used for local anesthesia. Under direct ultrasound guidance, a 19 gauge, 7-cm, Yueh catheter was introduced. An ultrasound image was saved for documentation purposed. The paracentesis was performed. The catheter was removed and a dressing was applied. The patient tolerated the procedure well without immediate post procedural complication. FINDINGS: A total of approximately 3 liters of serous fluid was removed. Samples were sent to the laboratory as requested by the clinical team. IMPRESSION: Successful ultrasound-guided paracentesis yielding 3 liters of peritoneal fluid. Electronically Signed   By: Sandi Mariscal M.D.   On: 11/09/2017 15:43        Scheduled Meds: . clopidogrel  75 mg Oral Daily  . darbepoetin (ARANESP) injection - DIALYSIS  200 mcg Intravenous Q Wed-HD  . diltiazem  120 mg Oral Daily  . famotidine  20 mg Oral QHS  . feeding supplement (NEPRO CARB STEADY)  237 mL Oral BID BM  . folic acid  1 mg Oral Daily  . gabapentin  100 mg Oral BID  . heparin  5,000 Units Subcutaneous Q8H  . hydrALAZINE  100 mg Oral BID  . ipratropium-albuterol  3 mL Nebulization BID  . isosorbide mononitrate  30 mg Oral Daily  . lidocaine      . methylPREDNISolone (SOLU-MEDROL) injection  40 mg Intravenous Q12H  . mometasone-formoterol  2 puff Inhalation BID  . multivitamin with minerals  1 tablet Oral Daily  . nicotine  21 mg Transdermal Daily  . pantoprazole  40 mg Oral Daily  . sodium chloride flush  10-40 mL Intracatheter Q12H  . thiamine  100 mg Oral Daily    Or  . thiamine  100 mg Intravenous Daily  . vancomycin  125 mg Oral QID   Continuous Infusions:  LOS: 5 days     Vernell Leep, MD, FACP, [facility]. [facility] Pager [phone]  If 7PM-7AM, please contact night-coverage [email or web address] Password [email or web address] 11/09/2017, 4:00 PM

## 2017-11-09 NOTE — Progress Notes (Signed)
Patient brought to IR for diagnostic paracentesis.  Limited US Abdomen performed which does show a small amount of fluid around the liver edge which may be amenable to drainage, however patient refuses procedure today saying "I don't have the nerves for this today." Explained to patient that labwork has been requested on the fluid but he states "I already had an infection, that's why I'm here" and continues to refuse procedure.   Ordering service notified. No procedure performed.  Patient returned to unit.   Brynda Greathouse, MS RD PA-C 9:52 AM

## 2017-11-09 NOTE — Consult Note (Signed)
Consultation Note Date: 11/09/2017   Patient Name: [name]  DOB: [birth year]  MRN: [id number]  Age / Sex: 57 y.o., male  PCP: [name], MD Referring Physician: Modena Jansky, MD  Reason for Consultation: Establishing goals of care and Psychosocial/spiritual support  HPI/Patient Profile: 57 y.o. male   admitted on 11/04/2017 with a past medical history significant for polysubstance abuse, hepatitis B, hepatitis C, cirrhosis with ascites, end-stage renal disease on hemodialysis, and COPD, now presenting to the emergency department for evaluation of abdominal pain and malaise.  Patient has had 12 hospitalizations in the last 6 months.  Limited social support.  He tells me that he does have siblings and 3 sons but none are active in his life at this time  Patient faces ongoing decisions regarding continuation of life prolonging interventions, and  care needs into the future.    Clinical Assessment and Goals of Care:  This NP Wadie Lessen reviewed medical records, received report from team, assessed the patient and then meet at the patient's bedside   to discuss diagnosis, prognosis, GOC, EOL wishes disposition and options.  Concept of Hospice and Palliative Care were discussed  A  discussion was had today regarding advanced directives.  Concepts specific to code status was had.  Patient verbalized desire today to document himself as a DNR/DNI   The difference between a aggressive medical intervention path  and a palliative comfort care path for this patient at this time was had.  Values and goals of care important to patient and family were attempted to be elicited.   Questions and concerns addressed.   Family encouraged to call with questions or concerns.    PMT will continue to support holistically.   PATIENT    SUMMARY OF RECOMMENDATIONS    Code Status/Advance Care  Planning:  DNR/DNI-documented today after discussion with patient   Symptom Management:   Anxiety: Patient reports anxiety and nervousness such a great level that he literally chews his nails to the skin and quick.  He was unable today to cooperate with planned paracentesis secondary to "my nerves are so bad"              -Increased Xanax 0.5 mg p.o. 3 times daily as needed  Additional Recommendations (Limitations, Scope, Preferences):  Full Scope Treatment   Patient is willing to attempt paracentesis again today if possible utilizing the increased PRN Xanax.  I discussed the situation with interventional radiology and his bedside nurse Kim.  Psycho-social/Spiritual:   Desire for further Chaplaincy support:yes   Prognosis:   Unable to determine  Discharge Planning:  Discussed this patient's social situation with Levada Dy case Administrator, Civil Service.  Clearly patient could benefit from more services in his home in hopes of a more successful outpatient setting     To Be Determined      Primary Diagnoses: Present on Admission: . Abdominal pain . Anxiety . Anemia due to chronic kidney disease . Alcohol abuse . Chronic hepatitis B ([place]) . Chronic hepatitis C without hepatic  coma ([place]) . COPD (chronic obstructive pulmonary disease) ([place]) . ESRD (end stage renal disease) ([place]) . Other cirrhosis of liver ([place]) . SBP (spontaneous bacterial peritonitis) ([place]): PRESUMED. Marland Kitchen Hyponatremia   I have reviewed the medical record, interviewed the patient and family, and examined the patient. The following aspects are pertinent.  Past Medical History:  Diagnosis Date  . Adenomatous colon polyp    tubular  . Anemia   . Anxiety   . Arthritis    left shoulder  . Atherosclerosis of aorta ([place])   . Cardiomegaly   . Chest pain    DATE UNKNOWN, C/O PERIODICALLY  . Cocaine abuse ([place])   . COPD exacerbation ([place]) 08/17/2016  . Coronary artery disease    stent 02/22/17  . Dialysis patient  ([place])    Monday-Wednesday-Friday  . ESRD (end stage renal disease) on dialysis ([place])    "[name]; MWF" (07/04/2017)  . GERD (gastroesophageal reflux disease)    DATE UNKNOWN  . Hemorrhoids   . Hepatitis B, chronic ([place])   . Hepatitis C   . History of kidney stones   . Hyperkalemia   . Hypertension   . Metabolic bone disease    Patient denies  . Mitral stenosis   . Myocardial infarction ([place])   . Pneumonia   . Pulmonary edema   . Renal disorder   . Renal insufficiency   . Shortness of breath dyspnea    " for the last past year with this dialysis"  . Solitary rectal ulcer syndrome   . Tubular adenoma of colon    Social History   Socioeconomic History  . Marital status: Single    Spouse name: Not on file  . Number of children: 3  . Years of education: 10  . Highest education level: Not on file  Occupational History  . Occupation: Unemployed  Social Needs  . Financial resource strain: Not on file  . Food insecurity:    Worry: Not on file    Inability: Not on file  . Transportation needs:    Medical: Not on file    Non-medical: Not on file  Tobacco Use  . Smoking status: Current Every Day Smoker    Packs/day: 0.50    Years: 43.00    Pack years: 21.50    Types: Cigarettes    Start date: 08/13/1973  . Smokeless tobacco: Never Used  Substance and Sexual Activity  . Alcohol use: Not Currently    Frequency: Never    Comment: quit drinking in 2017  . Drug use: Not Currently    Comment: quit in 2017"  . Sexual activity: Not on file    Comment: "NOT LATELY" on cocaine  Lifestyle  . Physical activity:    Days per week: Not on file    Minutes per session: Not on file  . Stress: Not on file  Relationships  . Social connections:    Talks on phone: Not on file    Gets together: Not on file    Attends religious service: Not on file    Active member of club or organization: Not on file    Attends meetings of clubs or organizations: Not on file    Relationship  status: Not on file  Other Topics Concern  . Not on file  Social History Narrative   Lives alone   Caffeine use: Coffee-rare   Soda- daily      [facility] (03/10/17):   Originally from [facility]. Previously worked trimming trees. No  pets currently. No bird or mold exposure.    Family History  Problem Relation Age of Onset  . Heart disease Mother   . Lung cancer Mother   . Heart disease Father   . Malignant hyperthermia Father   . COPD Father   . Throat cancer Sister   . Esophageal cancer Sister   . Hypertension Other   . COPD Other   . Colon cancer Neg Hx   . Colon polyps Neg Hx   . Rectal cancer Neg Hx   . Stomach cancer Neg Hx    Scheduled Meds: . clopidogrel  75 mg Oral Daily  . darbepoetin (ARANESP) injection - DIALYSIS  200 mcg Intravenous Q Wed-HD  . diltiazem  120 mg Oral Daily  . famotidine  20 mg Oral QHS  . feeding supplement (NEPRO CARB STEADY)  237 mL Oral BID BM  . folic acid  1 mg Oral Daily  . gabapentin  100 mg Oral BID  . heparin  5,000 Units Subcutaneous Q8H  . hydrALAZINE  100 mg Oral BID  . ipratropium-albuterol  3 mL Nebulization BID  . isosorbide mononitrate  30 mg Oral Daily  . methylPREDNISolone (SOLU-MEDROL) injection  40 mg Intravenous Q12H  . mometasone-formoterol  2 puff Inhalation BID  . multivitamin with minerals  1 tablet Oral Daily  . nicotine  21 mg Transdermal Daily  . pantoprazole  40 mg Oral Daily  . sodium chloride flush  10-40 mL Intracatheter Q12H  . sodium chloride flush  3 mL Intravenous Q12H  . thiamine  100 mg Oral Daily   Or  . thiamine  100 mg Intravenous Daily  . vancomycin  125 mg Oral QID   Continuous Infusions: . sodium chloride    . sodium chloride    . sodium chloride     PRN Meds:.sodium chloride, sodium chloride, sodium chloride, albuterol, ALPRAZolam, alteplase, bisacodyl, guaiFENesin, heparin, HYDROmorphone (DILAUDID) injection, lidocaine (PF), lidocaine-prilocaine, ondansetron **OR** ondansetron (ZOFRAN)  IV, oxyCODONE, pentafluoroprop-tetrafluoroeth, senna-docusate, sodium chloride flush, sodium chloride flush Medications Prior to Admission:  Prior to Admission medications   Medication Sig Start Date End Date Taking? Authorizing Provider  albuterol (VENTOLIN HFA) 108 (90 Base) MCG/ACT inhaler Inhale 2 puffs into the lungs every 6 (six) hours as needed for wheezing or shortness of breath.   Yes [provider]  ALPRAZolam (XANAX) 0.25 MG tablet Take 1 tablet (0.25 mg total) by mouth 3 (three) times daily as needed for anxiety or sleep. 10/30/17  Yes Eugenie Filler, MD  budesonide-formoterol [facility]) 80-4.5 MCG/ACT inhaler Inhale 2 puffs into the lungs 2 (two) times daily. 10/13/17  Yes Tanda Rockers, MD  cefTAZidime (FORTAZ) 1 g SOLR injection Inject 1 g into the vein every Monday, Wednesday, and Friday. In HD x 1 week. 10/31/17  Yes Eugenie Filler, MD  diltiazem (CARDIZEM CD) 120 MG 24 hr capsule Take 1 capsule (120 mg total) by mouth daily. 06/23/17 06/23/18 Yes Shahmehdi, Valeria Batman, MD  famotidine (PEPCID) 20 MG tablet One at bedtime Patient taking differently: Take 20 mg by mouth at bedtime.  09/08/17  Yes Tanda Rockers, MD  fluticasone (FLONASE) 50 MCG/ACT nasal spray Place 2 sprays into both nostrils daily. 10/31/17  Yes Eugenie Filler, MD  gabapentin (NEURONTIN) 100 MG capsule Take 1 capsule (100 mg total) by mouth at bedtime. Patient taking differently: Take 100 mg by mouth 2 (two) times daily.  09/30/17  Yes Mariel Aloe, MD  guaiFENesin (ROBITUSSIN) 100 MG/5ML SOLN  Take 5 mLs (100 mg total) by mouth every 4 (four) hours as needed for cough or to loosen phlegm. 10/30/17  Yes Eugenie Filler, MD  hydrALAZINE (APRESOLINE) 100 MG tablet Take 1 tablet by mouth twice a day take after HD on dialysis days 08/31/17  Yes [provider]  isosorbide mononitrate (IMDUR) 30 MG 24 hr tablet Take 1 tablet (30 mg total) by mouth daily. 06/17/17  Yes [name], MD  loratadine  (CLARITIN) 10 MG tablet Take 1 tablet (10 mg total) by mouth daily. 10/31/17  Yes Eugenie Filler, MD  Nutritional Supplements (FEEDING SUPPLEMENT, NEPRO CARB STEADY,) LIQD Take 237 mLs by mouth 2 (two) times daily between meals. 09/30/17  Yes Mariel Aloe, MD  oxyCODONE (OXY IR/ROXICODONE) 5 MG immediate release tablet Take 5 mg by mouth 2 (two) times daily as needed for moderate pain.  09/22/17  Yes [provider]  predniSONE (DELTASONE) 20 MG tablet Take 40 mg by mouth daily with breakfast.   Yes [provider]  sevelamer carbonate (RENVELA) 800 MG tablet Take 1,600-3,200 mg by mouth 4 (four) times daily. Take 4 tablets (800mg  each) three times daily and 2 tablets twice daily with a snack. 10/31/17  Yes [provider]  tiotropium (SPIRIVA) 18 MCG inhalation capsule Place 18 mcg into inhaler and inhale daily.   Yes [provider]  clopidogrel (PLAVIX) 75 MG tablet Take 1 tablet (75 mg total) by mouth daily. Patient not taking: Reported on 11/06/2017 07/15/17   Gabriel Earing, PA-C  nicotine (NICODERM CQ - DOSED IN MG/24 HOURS) 21 mg/24hr patch Place 21 mg onto the skin daily.    [provider]  pantoprazole (PROTONIX) 40 MG tablet Take 1 tablet (40 mg total) by mouth daily. Take 30-60 min before first meal of the day Patient not taking: Reported on 11/06/2017 09/08/17   Tanda Rockers, MD   Allergies  Allergen Reactions  . Aspirin Other (See Comments)    STOMACH PAIN  . Tylenol [Acetaminophen]     Stomach ache  . Clonidine Derivatives Itching  . Tramadol Itching   Review of Systems  Constitutional: Positive for fatigue.  Neurological: Positive for weakness.  Psychiatric/Behavioral: The patient is nervous/anxious.     Physical Exam  Constitutional: He appears well-developed. He appears ill.  Cardiovascular: Tachycardia present.  Neurological: He is alert.  Skin: Skin is warm and dry.  Nails on both hands to to the quick    Vital  Signs: BP (!) 144/97   Pulse (!) 108   Temp 98.2 F (36.8 C)   Resp 18   Ht 5\' 9"  (1.753 m)   Wt 69.2 kg (152 lb 8 oz)   SpO2 98%   BMI 22.52 kg/m  Pain Scale: 0-10 POSS *See Group Information*: S-Acceptable,Sleep, easy to arouse Pain Score: 7    SpO2: SpO2: 98 % O2 Device:SpO2: 98 % O2 Flow Rate: .   IO: Intake/output summary:   Intake/Output Summary (Last 24 hours) at 11/09/2017 1057 Last data filed at 11/08/2017 1500 Gross per 24 hour  Intake 200 ml  Output 1800 ml  Net -1600 ml    LBM: Last BM Date: 11/09/17 Baseline Weight: Weight: 72.6 kg (160 lb) Most recent weight: Weight: 69.2 kg (152 lb 8 oz)     Palliative Assessment/Data:     Time In: 1045 Time Out: 1200 Time Total: 75 minutes Greater than 50%  of this time was spent counseling and coordinating care related to the  above assessment and plan.  Signed by: Wadie Lessen, NP   Please contact Palliative Medicine Team phone at [phone] for questions and concerns.  For individual provider: See Shea Evans

## 2017-11-09 NOTE — H&P (View-Only) (Signed)
Patient is a 57 year old male admitted with C. difficile colitis as well as complications of cirrhosis.  I was asked to see him by the [facility] hospitalist service for follow-up on his recent left and right arm operations related to hemodialysis access.  He had an infiltration of his left arm access which resulted in a large wound eventually requiring skin grafting.  He had a new right brachiocephalic AV fistula placed about 3 months ago.  He currently is dialyzing via right sided catheter.  Review of systems: He currently denies fever or chills.  He denies shortness of breath or chest pain.  Physical exam:  Vitals:   11/09/17 0900 11/09/17 1337 11/09/17 1500 11/09/17 1511  BP: (!) 144/97 (!) 150/98 136/75 114/76  Pulse:  (!) 119    Resp:  18    Temp:  98 F (36.7 C)    TempSrc:  Oral    SpO2:  97%    Weight:      Height:        Left upper extremity: Well-healed left upper arm skin graft  Right upper extremity: Palpable thrill AV fistula right arm mid to distal section is not well-developed about 3 mm in diameter.  Proximal section is about 5 to 6 mm in diameter with abundant collaterals.  Assessment: Poorly maturing AV fistula right arm  Plan: Patient eventually needs a fistulogram possible intervention on his right arm with either sidebranch ligation or possibly angioplasty or revision.  However, since he is currently admitted with C. difficile colitis and other infectious issues I would prefer to defer this until after he has improved.  I will schedule him for a right arm fistulogram possible intervention next week.  Our office will contact him regarding times dates and places.  Ruta Hinds, MD [facility] Office: [phone] Pager: [phone]

## 2017-11-09 NOTE — Consult Note (Signed)
Patient is a 57 year old male admitted with C. difficile colitis as well as complications of cirrhosis.  I was asked to see him by the [facility] hospitalist service for follow-up on his recent left and right arm operations related to hemodialysis access.  He had an infiltration of his left arm access which resulted in a large wound eventually requiring skin grafting.  He had a new right brachiocephalic AV fistula placed about 3 months ago.  He currently is dialyzing via right sided catheter.  Review of systems: He currently denies fever or chills.  He denies shortness of breath or chest pain.  Physical exam:  Vitals:   11/09/17 0900 11/09/17 1337 11/09/17 1500 11/09/17 1511  BP: (!) 144/97 (!) 150/98 136/75 114/76  Pulse:  (!) 119    Resp:  18    Temp:  98 F (36.7 C)    TempSrc:  Oral    SpO2:  97%    Weight:      Height:        Left upper extremity: Well-healed left upper arm skin graft  Right upper extremity: Palpable thrill AV fistula right arm mid to distal section is not well-developed about 3 mm in diameter.  Proximal section is about 5 to 6 mm in diameter with abundant collaterals.  Assessment: Poorly maturing AV fistula right arm  Plan: Patient eventually needs a fistulogram possible intervention on his right arm with either sidebranch ligation or possibly angioplasty or revision.  However, since he is currently admitted with C. difficile colitis and other infectious issues I would prefer to defer this until after he has improved.  I will schedule him for a right arm fistulogram possible intervention next week.  Our office will contact him regarding times dates and places.  Ruta Hinds, MD [facility] Office: [phone] Pager: [phone]

## 2017-11-09 NOTE — Progress Notes (Addendum)
[facility] Progress Note  Subjective: no new /co   Vitals:   11/09/17 0813 11/09/17 0820 11/09/17 0900 11/09/17 1337  BP:   (!) 144/97 (!) 150/98  Pulse: (!) 108   (!) 119  Resp: 18   18  Temp:    98 F (36.7 C)  TempSrc:    Oral  SpO2: 98% 98%  97%  Weight:      Height:        Inpatient medications: . clopidogrel  75 mg Oral Daily  . darbepoetin (ARANESP) injection - DIALYSIS  200 mcg Intravenous Q Wed-HD  . diltiazem  120 mg Oral Daily  . famotidine  20 mg Oral QHS  . feeding supplement (NEPRO CARB STEADY)  237 mL Oral BID BM  . folic acid  1 mg Oral Daily  . gabapentin  100 mg Oral BID  . heparin  5,000 Units Subcutaneous Q8H  . hydrALAZINE  100 mg Oral BID  . ipratropium-albuterol  3 mL Nebulization BID  . isosorbide mononitrate  30 mg Oral Daily  . lidocaine      . methylPREDNISolone (SOLU-MEDROL) injection  40 mg Intravenous Q12H  . mometasone-formoterol  2 puff Inhalation BID  . multivitamin with minerals  1 tablet Oral Daily  . nicotine  21 mg Transdermal Daily  . pantoprazole  40 mg Oral Daily  . sodium chloride flush  10-40 mL Intracatheter Q12H  . sodium chloride flush  3 mL Intravenous Q12H  . thiamine  100 mg Oral Daily   Or  . thiamine  100 mg Intravenous Daily  . vancomycin  125 mg Oral QID   . sodium chloride    . sodium chloride    . sodium chloride     sodium chloride, sodium chloride, sodium chloride, albuterol, ALPRAZolam, alteplase, bisacodyl, guaiFENesin, heparin, HYDROmorphone (DILAUDID) injection, lidocaine (PF), lidocaine-prilocaine, ondansetron **OR** ondansetron (ZOFRAN) IV, oxyCODONE, pentafluoroprop-tetrafluoroeth, senna-docusate, sodium chloride flush, sodium chloride flush  Exam:  Alert, no distress Thin No jvd Chest clear to bases bilat RRR  abd soft mod protuberant nontender mild ascites Ext no leg edema Right upper arm avf w/ bruit   Dialysis: [facility] 4h  2/2 bath  69kg  400/800   Tdc/R avf (Aug 09 2017)    heparin none -venofer 100mg  IV x10 until 4/24 -mircera 251mcg IV q 2 weeks (last 4/17) -parsabiv 5mg  IV TIW  -BMM Renvela 3 qac 2 q snacks -calcitriol 0.5      Impression: 1 cdif / diarrhea per primary 2 spont bact peritonitis- sp recent abx course 3 esrd hd [facility] had hd yest 4 cirrhosis from etoh and hep b 5 hypertension/ vol - at dry wt , cont hydralazine and diltiazem, consider lowering dry 6 cad sp stent to rca 7 severe pulm htn 9 parox afib - diltiazem  Plan - hd tomorrow off schedule    [name] MD [facility] pgr [phone]   11/09/2017, 3:09 PM         Recent Labs  Lab 11/05/17 0651 11/06/17 0434 11/08/17 0728  NA 125* 131* 129*  K 5.3* 4.4 5.4*  CL 89* 90* 87*  CO2 22 24 25   GLUCOSE 79 114* 95  BUN 60* 36* 59*  CREATININE 7.72* 5.47* 8.28*  CALCIUM 9.0 8.4* 9.1  PHOS  --   --  4.5   Recent Labs  Lab 11/03/17 1102 11/04/17 1727 11/05/17 0651 11/08/17 0728  AST 47* 28 22  --   ALT 48 32  27  --   ALKPHOS 194* 181* 180*  --   BILITOT 1.0 1.3* 1.3*  --   PROT 6.7 7.0 6.8  --   ALBUMIN 3.1* 3.3* 3.2* 3.0*   Recent Labs  Lab 11/04/17 0148 11/05/17 0651 11/06/17 0434 11/08/17 0728  WBC 14.1* 12.9* 11.1* 9.8  NEUTROABS 11.6* 9.7*  --   --   HGB 9.7* 9.4* 8.9* 8.9*  HCT 28.9* 27.9* 26.2* 26.2*  MCV 76.7* 75.0* 75.7* 74.4*  PLT 158 156 130* 155   Iron/TIBC/Ferritin/ %Sat    Component Value Date/Time   IRON 104 09/03/2017 0800   TIBC 343 09/03/2017 0800   FERRITIN 513 (H) 09/03/2017 0800   IRONPCTSAT 30 09/03/2017 0800

## 2017-11-09 NOTE — Procedures (Signed)
Pre Procedural Dx: Symptomatic Ascites Post Procedural Dx: Same  Successful US guided paracentesis yielding 3 L of serous ascitic fluid. Sample sent to laboratory as requested.  EBL: None  Complications: None immediate  Jay Jerline Linzy, MD Pager #: [phone]   

## 2017-11-10 ENCOUNTER — Ambulatory Visit: Payer: Medicare Other | Admitting: Vascular Surgery

## 2017-11-10 ENCOUNTER — Ambulatory Visit: Payer: Medicare Other | Admitting: Physician Assistant

## 2017-11-10 ENCOUNTER — Encounter (HOSPITAL_COMMUNITY): Payer: Self-pay

## 2017-11-10 DIAGNOSIS — Z888 Allergy status to other drugs, medicaments and biological substances status: Secondary | ICD-10-CM

## 2017-11-10 DIAGNOSIS — R5381 Other malaise: Secondary | ICD-10-CM

## 2017-11-10 DIAGNOSIS — Z885 Allergy status to narcotic agent status: Secondary | ICD-10-CM

## 2017-11-10 DIAGNOSIS — R5383 Other fatigue: Secondary | ICD-10-CM

## 2017-11-10 DIAGNOSIS — Z886 Allergy status to analgesic agent status: Secondary | ICD-10-CM

## 2017-11-10 LAB — RENAL FUNCTION PANEL
Albumin: 2.4 g/dL — ABNORMAL LOW (ref 3.5–5.0)
Anion gap: 17 — ABNORMAL HIGH (ref 5–15)
BUN: 53 mg/dL — ABNORMAL HIGH (ref 6–20)
CO2: 21 mmol/L — ABNORMAL LOW (ref 22–32)
Calcium: 6.3 mg/dL — CL (ref 8.9–10.3)
Chloride: 102 mmol/L (ref 101–111)
Creatinine, Ser: 5.59 mg/dL — ABNORMAL HIGH (ref 0.61–1.24)
GFR calc Af Amer: 12 mL/min — ABNORMAL LOW (ref >60)
GFR calc non Af Amer: 10 mL/min — ABNORMAL LOW (ref >60)
Glucose, Bld: 149 mg/dL — ABNORMAL HIGH (ref 65–99)
Phosphorus: 3.2 mg/dL (ref 2.5–4.6)
Potassium: 3.6 mmol/L (ref 3.5–5.1)
Sodium: 140 mmol/L (ref 135–145)

## 2017-11-10 LAB — PATHOLOGIST SMEAR REVIEW

## 2017-11-10 MED ORDER — DARBEPOETIN ALFA 200 MCG/0.4ML IJ SOSY: 200.0000 ug | PREFILLED_SYRINGE | INTRAMUSCULAR | Status: DC

## 2017-11-10 MED ORDER — DARBEPOETIN ALFA 200 MCG/0.4ML IJ SOSY
200.0000 ug | PREFILLED_SYRINGE | Freq: Once | INTRAMUSCULAR | Status: AC
  Administered 2017-11-10: 200 ug via INTRAVENOUS
  Filled 2017-11-10: qty 0.4

## 2017-11-10 MED ORDER — DIPHENHYDRAMINE HCL 50 MG/ML IJ SOLN
INTRAMUSCULAR | Status: AC
  Filled 2017-11-10: qty 1

## 2017-11-10 MED ORDER — DARBEPOETIN ALFA 200 MCG/0.4ML IJ SOSY
PREFILLED_SYRINGE | INTRAMUSCULAR | Status: AC
  Filled 2017-11-10: qty 0.4

## 2017-11-10 MED ORDER — DIPHENHYDRAMINE HCL 50 MG/ML IJ SOLN
25.0000 mg | Freq: Once | INTRAMUSCULAR | Status: AC
  Administered 2017-11-10: 25 mg via INTRAVENOUS

## 2017-11-10 NOTE — Progress Notes (Signed)
PROGRESS NOTE   [name]  ZOX:[id number]    DOB: 01-25-1961    DOA: 11/04/2017  PCP: [name], MD   I have briefly reviewed patients previous medical records in [facility].  Brief Narrative:  57 year old male with extensive and complex PMH: ESRD on HD, anemia, alcoholism, chronic hepatitis B & C complicated by cirrhosis and ascites, COPD, chronic diastolic CHF by echo 02/19/9146 with dilated RV, CAD status post RCA stent 02/22/2017, severe pulmonary hypertension with very dilated RV by echo, PAF, frequent hospitalizations (12 in the last 6 months) recently admitted for abdominal pain, SBP, negative cultures, presented with recurrent abdominal pain and diagnosed with C. difficile colitis this admission.  Nephrology consulted for dialysis needs.  Palliative team on board for goals of care.  Status post paracentesis 3 L by IR on 5/1.   Assessment & Plan:   Principal Problem:   SBP (spontaneous bacterial peritonitis) ([place]): PRESUMED. Active Problems:   Alcohol abuse   Chronic hepatitis B (HCC)   Chronic hepatitis C without hepatic coma (HCC)   COPD (chronic obstructive pulmonary disease) (HCC)   Anxiety   Anemia due to chronic kidney disease   ESRD (end stage renal disease) (HCC)   Other cirrhosis of liver (HCC)   Abdominal pain   Liver disease, chronic   Hyponatremia   DNR (do not resuscitate)   Palliative care by specialist   Suspected C. difficile colitis: Initially treated with oral vancomycin.  ID was consulted on 5/1.  Diarrhea improved.  As per ID, indeterminant C. difficile test and clinical picture does not appear consistent with C. difficile.  I discussed with Dr. Johnnye Sima and oral vancomycin discontinued 5/2.  Alcoholic, hep B & C cirrhosis with ascites/SBP: Status post recent antibiotics.  Underwent ultrasound-guided paracentesis 3 L on 11/09/2017.  Preliminary ascitic fluid results show 2100 WBCs predominantly neutrophilic, concern regarding  persistent SBP.  ID consulted and antimicrobials changed to ceftazidime and fluconazole.  Ascitic fluid cultures, negative to date.  ESRD on MWF HD: Nephrology following.  Last HD 4/30, refused to go for HD last night and this morning and stated that he just does not feel up to it.  Currently appears to be in HD.  Poor maturing right AVF: I discussed with Dr. Valinda Hoar, vascular surgery and his input appreciated.  Plans for fistulogram when infection improves, possibly next week.  Essential hypertension: Mildly controlled at times.  Continue diltiazem, hydralazine, Imdur.  Volume management across HD.  CAD status post stent to RCA: Asymptomatic of chest pain.  Remains on Plavix and Imdur.  Severe pulmonary artery hypertension  Paroxysmal A. fib: Continue diltiazem.  Dyspnea: Noted on 4/30.  Chest x-ray showed atelectasis and mild pulmonary congestion.  Volume management across dialysis.  Anxiety could also be the cause.  Had been empirically started on IV Solu-Medrol but no clinical bronchospasm and hence will discontinue.  Remains on as needed Xanax.  Dyspnea resolved.  Anemia of ESRD: Stable.  COPD: Stable without clinical bronchospasm.  Pain: Controlled at this time.  Adult failure to thrive: Multifactorial due to multiple severe significant comorbidities.  Palliative care consulted on 4/30.  Changed to DNR.  Xanax was increased for significant anxiety.  He then allowed paracentesis which he had declined earlier.  DVT prophylaxis: Subcutaneous heparin Code Status: DNR Family Communication: None at bedside Disposition: To be determined pending clinical improvement, possibly 5/3.   Consultants:  Nephrology IR Palliative care medicine Infectious disease  Procedures:  Ultrasound-guided paracentesis by IR 5/1  HD  Antimicrobials:  Vancomycin   Subjective: Overall feels better.  Abdominal distention improved and denied abdominal pain.  2 soft BMs reported in the last 24 hours.   No nausea or vomiting.  Denies chest pain, dyspnea.  As per RN, no acute issues reported.  ROS: As above  Objective:  Vitals:   11/10/17 0629 11/10/17 0634 11/10/17 0951 11/10/17 0956  BP:  (!) 145/90    Pulse:  99 (!) 124   Resp:  18 18   Temp:  97.7 F (36.5 C)    TempSrc:  Oral    SpO2:  98% 99% 99%  Weight: 76.1 kg (167 lb 12.3 oz)     Height:        Examination:  General exam: Pleasant young male, moderately built and nourished lying comfortably propped up in bed. Respiratory system: Diminished breath sounds in the bases but otherwise clear to auscultation.  No increased work of breathing.  Right TDC.  Stable. Cardiovascular system: S1 & S2 heard, RRR. No JVD, murmurs, rubs, gallops or clicks. No pedal edema.  Stable. Gastrointestinal system: Abdomen is distended but less compared to 5/1/moderate, soft and nontender. No organomegaly or masses felt. Normal bowel sounds heard.  Ascites +. Central nervous system: Alert and oriented. No focal neurological deficits. Extremities: Symmetric 5 x 5 power.  Left upper extremity AV fistula. Skin: No rashes, lesions or ulcers Psychiatry: Judgement and insight appear normal. Mood & affect appropriate.     Data Reviewed: I have personally reviewed following labs and imaging studies  CBC: Recent Labs  Lab 11/04/17 0148 11/05/17 0651 11/06/17 0434 11/08/17 0728  WBC 14.1* 12.9* 11.1* 9.8  NEUTROABS 11.6* 9.7*  --   --   HGB 9.7* 9.4* 8.9* 8.9*  HCT 28.9* 27.9* 26.2* 26.2*  MCV 76.7* 75.0* 75.7* 74.4*  PLT 158 156 130* 250   Basic Metabolic Panel: Recent Labs  Lab 11/04/17 0148 11/04/17 1727 11/05/17 0651 11/06/17 0434 11/08/17 0728  NA 127* 129* 125* 131* 129*  K 5.1 5.4* 5.3* 4.4 5.4*  CL 86* 91* 89* 90* 87*  CO2 24 23 22 24 25   GLUCOSE 101* 120* 79 114* 95  BUN 38* 50* 60* 36* 59*  CREATININE 5.92* 6.87* 7.72* 5.47* 8.28*  CALCIUM 8.9 8.9 9.0 8.4* 9.1  MG  --   --  2.4  --   --   PHOS  --   --   --   --  4.5    Liver Function Tests: Recent Labs  Lab 11/04/17 1727 11/05/17 0651 11/08/17 0728  AST 28 22  --   ALT 32 27  --   ALKPHOS 181* 180*  --   BILITOT 1.3* 1.3*  --   PROT 7.0 6.8  --   ALBUMIN 3.3* 3.2* 3.0*   Coagulation Profile: Recent Labs  Lab 11/05/17 0651  INR 1.32   CBG: Recent Labs  Lab 11/05/17 2003 11/06/17 0815 11/07/17 0909 11/07/17 1659 11/09/17 0801  GLUCAP 137* 118* 104* 102* 169*    Recent Results (from the past 240 hour(s))  Culture, blood (routine x 2)     Status: None   Collection Time: 11/04/17  8:20 AM  Result Value Ref Range Status   Specimen Description BLOOD RIGHT ARM  Final   Special Requests   Final    BOTTLES DRAWN AEROBIC ONLY Blood Culture adequate volume   Culture   Final    NO GROWTH 5 DAYS Performed at [facility], [address] [place], [place] [address]    Report Status 11/09/2017 FINAL  Final  Culture, blood (routine x 2)     Status: None   Collection Time: 11/04/17  8:29 AM  Result Value Ref Range Status   Specimen Description BLOOD RIGHT HAND  Final   Special Requests   Final    BOTTLES DRAWN AEROBIC AND ANAEROBIC Blood Culture adequate volume   Culture   Final    NO GROWTH 5 DAYS Performed at [facility], [place] [address], [place], [place] [address]    Report Status 11/09/2017 FINAL  Final  C difficile quick scan w PCR reflex     Status: Abnormal   Collection Time: 11/04/17  2:53 PM  Result Value Ref Range Status   C Diff antigen POSITIVE (A) NEGATIVE Final   C Diff toxin NEGATIVE NEGATIVE Final   C Diff interpretation Results are indeterminate. See PCR results.  Final  Stool culture (children & immunocomp patients)     Status: None   Collection Time: 11/04/17  2:53 PM  Result Value Ref Range Status   Salmonella/Shigella Screen Final report  Final   Campylobacter Culture Final report  Final   E coli, Shiga toxin Assay Negative Negative Final    Comment: (NOTE) Performed At: [facility] [place], [place] [id number] Rush Farmer MD [id number] Performed at [facility], [place] [address], [place], [place] [address]   C. Diff by PCR, Reflexed     Status: Abnormal   Collection Time: 11/04/17  2:53 PM  Result Value Ref Range Status   Toxigenic C. Difficile by PCR POSITIVE (A) NEGATIVE Final    Comment: Positive for toxigenic C. difficile with little to no toxin production. Only treat if clinical presentation suggests symptomatic illness. Performed at [facility], [place] [address] [place], [place] [address]   STOOL CULTURE REFLEX - RSASHR     Status: None   Collection Time: 11/04/17  2:53 PM  Result Value Ref Range Status   Stool Culture result 1 (RSASHR) Comment  Final    Comment: (NOTE) No Salmonella or Shigella recovered. Performed At: [facility] [place], [place] [id number] Rush Farmer MD [id number] Performed at [facility], [place] [address], [place], [place] [address]   STOOL CULTURE Reflex - CMPCXR     Status: None   Collection Time: 11/04/17  2:53 PM  Result Value Ref Range Status   Stool Culture result 1 (CMPCXR) Comment  Final    Comment: (NOTE) No Campylobacter species isolated. Performed At: [facility] [place], [place] [id number] Rush Farmer MD [id number] Performed at [facility], [place] [address] [place], [place] [address]   MRSA PCR Screening     Status: None   Collection Time: 11/04/17  8:20 PM  Result Value Ref Range Status   MRSA by PCR NEGATIVE NEGATIVE Final    Comment:        The GeneXpert MRSA Assay (FDA approved for NASAL specimens only), is one component of a comprehensive MRSA colonization surveillance program. It is not intended to diagnose MRSA infection nor to guide or monitor treatment for MRSA infections. Performed at [facility], [place] [address], [place], [place] [address]   Gram stain     Status: None    Collection Time: 11/09/17  2:41 PM  Result Value Ref Range Status   Specimen Description PERITONEAL  Final  Special Requests NONE  Final   Gram Stain   Final    ABUNDANT WBC PRESENT,BOTH PMN AND MONONUCLEAR NO ORGANISMS SEEN Performed at [facility], [place] [address] [place], [place] [address]    Report Status 11/09/2017 FINAL  Final  Culture, body fluid-bottle     Status: None (Preliminary result)   Collection Time: 11/09/17  2:41 PM  Result Value Ref Range Status   Specimen Description PERITONEAL  Final   Special Requests NONE  Final   Culture   Final    NO GROWTH 1 DAY Performed at [facility], [place] [address] [place], [place] [address]    Report Status PENDING  Incomplete         Radiology Studies: Ir Abdomen US Limited  Result Date: 11/09/2017 CLINICAL DATA:  History of cirrhosis with recurrent symptomatic ascites. Please perform ultrasound-guided paracentesis for therapeutic purposes. EXAM: LIMITED ABDOMEN ULTRASOUND FOR ASCITES TECHNIQUE: Limited ultrasound survey for ascites was performed in all four abdominal quadrants. COMPARISON:  Ascites search ultrasound-11/04/2017; ultrasound-guided paracentesis-10/28/2017; CT abdomen and pelvis - 10/25/2017 FINDINGS: Sonographic evaluation of the abdomen demonstrates a small amount of predominantly perihepatic intra-abdominal ascites. While the amount of fluid is likely amenable to ultrasound-guided paracentesis, the patient refused the procedure and as such, no procedure was performed. IMPRESSION: Small amount of intra-abdominal ascites, potentially amenable to ultrasound-guided paracentesis however the present time, the patient refuses to undergo the paracentesis Referring physician was made aware of patient's request. Electronically Signed   By: Sandi Mariscal M.D.   On: 11/09/2017 10:20   Ir Paracentesis  Result Date: 11/09/2017 INDICATION: Recurrent symptomatic ascites. Please perform ultrasound-guided paracentesis for  diagnostic and therapeutic purposes. EXAM: ULTRASOUND-GUIDED PARACENTESIS COMPARISON:  Ultrasound-guided paracentesis - 10/28/2017 CT abdomen and pelvis - 10/25/2017 MEDICATIONS: None. COMPLICATIONS: None immediate. TECHNIQUE: Informed written consent was obtained from the patient after a discussion of the risks, benefits and alternatives to treatment. A timeout was performed prior to the initiation of the procedure. Initial ultrasound scanning demonstrates a small to moderate amount of ascites within the right lower abdominal quadrant. The right lower abdomen was prepped and draped in the usual sterile fashion. 1% lidocaine with epinephrine was used for local anesthesia. Under direct ultrasound guidance, a 19 gauge, 7-cm, Yueh catheter was introduced. An ultrasound image was saved for documentation purposed. The paracentesis was performed. The catheter was removed and a dressing was applied. The patient tolerated the procedure well without immediate post procedural complication. FINDINGS: A total of approximately 3 liters of serous fluid was removed. Samples were sent to the laboratory as requested by the clinical team. IMPRESSION: Successful ultrasound-guided paracentesis yielding 3 liters of peritoneal fluid. Electronically Signed   By: Sandi Mariscal M.D.   On: 11/09/2017 15:43        Scheduled Meds: . clopidogrel  75 mg Oral Daily  . darbepoetin (ARANESP) injection - DIALYSIS  200 mcg Intravenous Once  . [START ON 11/18/2017] darbepoetin (ARANESP) injection - DIALYSIS  200 mcg Intravenous Q Fri-HD  . diltiazem  120 mg Oral Daily  . famotidine  20 mg Oral QHS  . feeding supplement (NEPRO CARB STEADY)  237 mL Oral BID BM  . folic acid  1 mg Oral Daily  . gabapentin  100 mg Oral BID  . heparin  5,000 Units Subcutaneous Q8H  . hydrALAZINE  100 mg Oral BID  . ipratropium-albuterol  3 mL Nebulization BID  . isosorbide mononitrate  30 mg Oral Daily  . mometasone-formoterol  2 puff Inhalation BID  .  multivitamin with minerals  1 tablet Oral Daily  . nicotine  21 mg Transdermal Daily  . sodium chloride flush  10-40 mL Intracatheter Q12H  . thiamine  100 mg Oral Daily   Or  . thiamine  100 mg Intravenous Daily  . vancomycin  125 mg Oral QID   Continuous Infusions: . cefTAZidime (FORTAZ)  IV    . fluconazole (DIFLUCAN) IV       LOS: 6 days     Vernell Leep, MD, FACP, [facility]. [facility] Pager [phone]  If 7PM-7AM, please contact night-coverage [email or web address] Password [email or web address] 11/10/2017, 3:40 PM

## 2017-11-10 NOTE — Progress Notes (Signed)
Attempt to get pt for HD tx tonight. Called for pt and pt refused to come for tx at this time. Dr. Moshe Cipro paged and made aware of pt's refusal.

## 2017-11-10 NOTE — Progress Notes (Signed)
[facility] for Infectious Disease  Date of Admission:  11/04/2017              ASSESSMENT/PLAN  Mr. Dollar was changed to Ceftazidime and fluconzole for presumed bacterial pertonitis with cultures remaining pending. He also had an indeterminate C. Diff PCR negative for toxin. Continuing to receive oral vancomycin (Day 6) and improving symptoms. Has had 2 soft formed bowel movements today. Seen by vascular surgery with concern for poorly maturing AV fistula in the right arm that will eventually need fistulogram and possible intervention after current infectious issues are resolved.   1. Continue ceftazidime and fluconazole. 2. Continue oral vancomycin for 3 more days given improvement in symptoms.  3. Continue to monitor cultures and narrow antibiotics as able.    Principal Problem:   SBP (spontaneous bacterial peritonitis) ([place]): PRESUMED. Active Problems:   Alcohol abuse   Chronic hepatitis B (HCC)   Chronic hepatitis C without hepatic coma (HCC)   COPD (chronic obstructive pulmonary disease) (HCC)   Anxiety   Anemia due to chronic kidney disease   ESRD (end stage renal disease) (HCC)   Other cirrhosis of liver (HCC)   Abdominal pain   Liver disease, chronic   Hyponatremia   DNR (do not resuscitate)   Palliative care by specialist   . clopidogrel  75 mg Oral Daily  . darbepoetin (ARANESP) injection - DIALYSIS  200 mcg Intravenous Q Wed-HD  . diltiazem  120 mg Oral Daily  . famotidine  20 mg Oral QHS  . feeding supplement (NEPRO CARB STEADY)  237 mL Oral BID BM  . folic acid  1 mg Oral Daily  . gabapentin  100 mg Oral BID  . heparin  5,000 Units Subcutaneous Q8H  . hydrALAZINE  100 mg Oral BID  . ipratropium-albuterol  3 mL Nebulization BID  . isosorbide mononitrate  30 mg Oral Daily  . mometasone-formoterol  2 puff Inhalation BID  . multivitamin with minerals  1 tablet Oral Daily  . nicotine  21 mg Transdermal Daily  . sodium chloride flush  10-40 mL  Intracatheter Q12H  . thiamine  100 mg Oral Daily   Or  . thiamine  100 mg Intravenous Daily  . vancomycin  125 mg Oral QID    SUBJECTIVE:  No fevers overnight. Feeling tired after having 3L of fluid drawn yesterday.   Allergies  Allergen Reactions  . Aspirin Other (See Comments)    STOMACH PAIN  . Tylenol [Acetaminophen]     Stomach ache  . Clonidine Derivatives Itching  . Tramadol Itching     Review of Systems: Review of Systems  Constitutional: Positive for malaise/fatigue. Negative for chills, diaphoresis and fever.  Respiratory: Negative for cough and shortness of breath.   Cardiovascular: Negative for chest pain and leg swelling.  Gastrointestinal: Negative for abdominal pain, constipation, diarrhea, nausea and vomiting.  Skin: Negative for rash.      OBJECTIVE: Vitals:   11/10/17 0629 11/10/17 0634 11/10/17 0951 11/10/17 0956  BP:  (!) 145/90    Pulse:  99 (!) 124   Resp:  18 18   Temp:  97.7 F (36.5 C)    TempSrc:  Oral    SpO2:  98% 99% 99%  Weight: 167 lb 12.3 oz (76.1 kg)     Height:       Body mass index is 24.78 kg/m.  Physical Exam  Constitutional: He appears well-developed and well-nourished. No distress.  Cardiovascular: Normal rate, regular rhythm, normal  heart sounds and intact distal pulses. Exam reveals no gallop and no friction rub.  No murmur heard. Pulmonary/Chest: Effort normal and breath sounds normal. No stridor. No respiratory distress. He has no wheezes. He has no rales. He exhibits no tenderness.  Abdominal: Soft. Bowel sounds are normal.  Skin: Skin is warm and dry.  Psychiatric: He has a normal mood and affect.    Lab Results Lab Results  Component Value Date   WBC 9.8 11/08/2017   HGB 8.9 (L) 11/08/2017   HCT 26.2 (L) 11/08/2017   MCV 74.4 (L) 11/08/2017   PLT 155 11/08/2017    Lab Results  Component Value Date   CREATININE 8.28 (H) 11/08/2017   BUN 59 (H) 11/08/2017   NA 129 (L) 11/08/2017   K 5.4 (H)  11/08/2017   CL 87 (L) 11/08/2017   CO2 25 11/08/2017    Lab Results  Component Value Date   ALT 27 11/05/2017   AST 22 11/05/2017   ALKPHOS 180 (H) 11/05/2017   BILITOT 1.3 (H) 11/05/2017     Microbiology: Recent Results (from the past 240 hour(s))  Culture, blood (routine x 2)     Status: None   Collection Time: 11/04/17  8:20 AM  Result Value Ref Range Status   Specimen Description BLOOD RIGHT ARM  Final   Special Requests   Final    BOTTLES DRAWN AEROBIC ONLY Blood Culture adequate volume   Culture   Final    NO GROWTH 5 DAYS Performed at [facility], [address], [place], [place] [address]    Report Status 11/09/2017 FINAL  Final  Culture, blood (routine x 2)     Status: None   Collection Time: 11/04/17  8:29 AM  Result Value Ref Range Status   Specimen Description BLOOD RIGHT HAND  Final   Special Requests   Final    BOTTLES DRAWN AEROBIC AND ANAEROBIC Blood Culture adequate volume   Culture   Final    NO GROWTH 5 DAYS Performed at [facility], [place] [address] [place], [place] [address]    Report Status 11/09/2017 FINAL  Final  C difficile quick scan w PCR reflex     Status: Abnormal   Collection Time: 11/04/17  2:53 PM  Result Value Ref Range Status   C Diff antigen POSITIVE (A) NEGATIVE Final   C Diff toxin NEGATIVE NEGATIVE Final   C Diff interpretation Results are indeterminate. See PCR results.  Final  Stool culture (children & immunocomp patients)     Status: None   Collection Time: 11/04/17  2:53 PM  Result Value Ref Range Status   Salmonella/Shigella Screen Final report  Final   Campylobacter Culture Final report  Final   E coli, Shiga toxin Assay Negative Negative Final    Comment: (NOTE) Performed At: [facility] [place], [place] [id number] Rush Farmer MD [id number] Performed at [facility], [place] [address] [place], [place] [address]   C. Diff by PCR, Reflexed     Status: Abnormal    Collection Time: 11/04/17  2:53 PM  Result Value Ref Range Status   Toxigenic C. Difficile by PCR POSITIVE (A) NEGATIVE Final    Comment: Positive for toxigenic C. difficile with little to no toxin production. Only treat if clinical presentation suggests symptomatic illness. Performed at [facility], [place] [address], [place], [place] [address]   STOOL CULTURE REFLEX - RSASHR     Status:  None   Collection Time: 11/04/17  2:53 PM  Result Value Ref Range Status   Stool Culture result 1 (RSASHR) Comment  Final    Comment: (NOTE) No Salmonella or Shigella recovered. Performed At: [facility] [place], [place] [id number] Rush Farmer MD [id number] Performed at [facility], [place] [address], [place], [place] [address]   STOOL CULTURE Reflex - CMPCXR     Status: None   Collection Time: 11/04/17  2:53 PM  Result Value Ref Range Status   Stool Culture result 1 (CMPCXR) Comment  Final    Comment: (NOTE) No Campylobacter species isolated. Performed At: [facility] [place], [place] [id number] Rush Farmer MD [id number] Performed at [facility], [place] [address], [place], [place] [address]   MRSA PCR Screening     Status: None   Collection Time: 11/04/17  8:20 PM  Result Value Ref Range Status   MRSA by PCR NEGATIVE NEGATIVE Final    Comment:        The GeneXpert MRSA Assay (FDA approved for NASAL specimens only), is one component of a comprehensive MRSA colonization surveillance program. It is not intended to diagnose MRSA infection nor to guide or monitor treatment for MRSA infections. Performed at [facility], [place] [address], [place], [place] [address]   Gram stain     Status: None   Collection Time: 11/09/17  2:41 PM  Result Value Ref Range Status   Specimen Description PERITONEAL  Final   Special Requests NONE  Final   Gram Stain   Final    ABUNDANT WBC PRESENT,BOTH PMN AND MONONUCLEAR NO  ORGANISMS SEEN Performed at [facility], [address] [place],  [address]    Report Status 11/09/2017 FINAL  Final     Terri Piedra, NP [place] [facility] [phone] Pager  11/10/2017  11:19 AM

## 2017-11-10 NOTE — Progress Notes (Signed)
Lab reported critical value Calcium 6.3.  Paged MD.

## 2017-11-10 NOTE — Progress Notes (Addendum)
[facility] Progress Note  Subjective:  Had paracentesis yesterday with 3L removed No new c/os today. Eating ice  For HD today     Vitals:   11/10/17 0629 11/10/17 0634 11/10/17 0951 11/10/17 0956  BP:  (!) 145/90    Pulse:  99 (!) 124   Resp:  18 18   Temp:  97.7 F (36.5 C)    TempSrc:  Oral    SpO2:  98% 99% 99%  Weight: 76.1 kg (167 lb 12.3 oz)     Height:        Inpatient medications: . clopidogrel  75 mg Oral Daily  . darbepoetin (ARANESP) injection - DIALYSIS  200 mcg Intravenous Q Wed-HD  . diltiazem  120 mg Oral Daily  . famotidine  20 mg Oral QHS  . feeding supplement (NEPRO CARB STEADY)  237 mL Oral BID BM  . folic acid  1 mg Oral Daily  . gabapentin  100 mg Oral BID  . heparin  5,000 Units Subcutaneous Q8H  . hydrALAZINE  100 mg Oral BID  . ipratropium-albuterol  3 mL Nebulization BID  . isosorbide mononitrate  30 mg Oral Daily  . mometasone-formoterol  2 puff Inhalation BID  . multivitamin with minerals  1 tablet Oral Daily  . nicotine  21 mg Transdermal Daily  . sodium chloride flush  10-40 mL Intracatheter Q12H  . thiamine  100 mg Oral Daily   Or  . thiamine  100 mg Intravenous Daily  . vancomycin  125 mg Oral QID   . cefTAZidime (FORTAZ)  IV    . fluconazole (DIFLUCAN) IV     albuterol, ALPRAZolam, bisacodyl, guaiFENesin, hydrocortisone cream, HYDROmorphone (DILAUDID) injection, ondansetron **OR** ondansetron (ZOFRAN) IV, oxyCODONE  Exam:  Alert, no distress Thin No jvd Chest clear to bases bilat RRR  abd soft mod protuberant nontender mild ascites Ext no leg edema Right upper arm avf w/ bruit   Dialysis: [facility] 4h  2/2 bath  69kg  400/800   Tdc/R avf (Aug 09 2017)   heparin none -venofer 100mg  IV x10 until 4/24 -mircera 262mcg IV q 2 weeks (last 4/17) -parsabiv 5mg  IV TIW  -BMM Renvela 3 qac 2 q snacks -calcitriol 0.5      Impression: 1 cdif / diarrhea per primary 2 spont bact peritonitis- sp recent abx course.  Repeat para/pd fluid cx 5/1. ceftazidime/fluconazole started per ID  3 esrd hd [facility] --off schedule this week  4 poorly maturing R AVF - seen by VVS and will need fistulogram when infection improves  5 cirrhosis from etoh and hep b 6 hypertension/ vol - at dry wt , cont hydralazine and diltiazem, consider lowering dry 7 cad sp stent to rca 8 severe pulm htn 9 parox afib - diltiazem  Plan - HD today off schedule    Lynnda Child PA-C [facility] Pager [phone] 11/10/2017,12:32 PM  Pt seen, examined and agree w A/P as above.  [name] MD [facility] pager [phone]   11/10/2017, 5:00 PM           Recent Labs  Lab 11/05/17 0651 11/06/17 0434 11/08/17 0728  NA 125* 131* 129*  K 5.3* 4.4 5.4*  CL 89* 90* 87*  CO2 22 24 25   GLUCOSE 79 114* 95  BUN 60* 36* 59*  CREATININE 7.72* 5.47* 8.28*  CALCIUM 9.0 8.4* 9.1  PHOS  --   --  4.5   Recent Labs  Lab 11/04/17 1727 11/05/17 7078  11/08/17 0728  AST 28 22  --   ALT 32 27  --   ALKPHOS 181* 180*  --   BILITOT 1.3* 1.3*  --   PROT 7.0 6.8  --   ALBUMIN 3.3* 3.2* 3.0*   Recent Labs  Lab 11/04/17 0148 11/05/17 0651 11/06/17 0434 11/08/17 0728  WBC 14.1* 12.9* 11.1* 9.8  NEUTROABS 11.6* 9.7*  --   --   HGB 9.7* 9.4* 8.9* 8.9*  HCT 28.9* 27.9* 26.2* 26.2*  MCV 76.7* 75.0* 75.7* 74.4*  PLT 158 156 130* 155   Iron/TIBC/Ferritin/ %Sat    Component Value Date/Time   IRON 104 09/03/2017 0800   TIBC 343 09/03/2017 0800   FERRITIN 513 (H) 09/03/2017 0800   IRONPCTSAT 30 09/03/2017 0800

## 2017-11-10 NOTE — Progress Notes (Signed)
Patient ID: [name], male   DOB: [birth year], 57 y.o.   MRN: [id number]  This NP visited patient at the bedside as a follow up for palliative medicine needs and emotional support.  Complicated medical situation secondary to multiple comorbidities, failure to thrive poor social support and overall long-term poor prognosis.  Patient speaks to his desire and openness to all offered and available medical interventions to prolong life.  We discussed the concept of mortality and ultimately limits of medical interventions to prolong quality of life when patient's bodies begin to fail to thrive secondary to complex disease processes.  Discussed with patient self responsibility in his overall treatment plan and wellness.  Suggested that an AL situation may be better for him.  He does not like the idea "they will take all my money and I need to be able to go".  It will be difficult for this patient to thrive in current overall situation, long term poor prognosis.   Questions and concerns addressed   Discussed with Dr  Exie Parody     Total time spent on the unit was 15 minutes  Greater than 50% of the time was spent in counseling and coordination of care  Joshua Lessen NP  Palliative Medicine Team Team Phone # [phone] Pager [phone]

## 2017-11-11 DIAGNOSIS — F419 Anxiety disorder, unspecified: Secondary | ICD-10-CM

## 2017-11-11 DIAGNOSIS — R197 Diarrhea, unspecified: Secondary | ICD-10-CM

## 2017-11-11 DIAGNOSIS — F988 Other specified behavioral and emotional disorders with onset usually occurring in childhood and adolescence: Secondary | ICD-10-CM

## 2017-11-11 DIAGNOSIS — R05 Cough: Secondary | ICD-10-CM

## 2017-11-11 MED ORDER — SEVELAMER CARBONATE 800 MG PO TABS
3200.0000 mg | ORAL_TABLET | Freq: Three times a day (TID) | ORAL | Status: DC
  Administered 2017-11-11 (×2): 3200 mg via ORAL
  Filled 2017-11-11 (×4): qty 4

## 2017-11-11 NOTE — Progress Notes (Signed)
[facility] Infectious Disease  Date of Admission:  11/04/2017             ASSESSMENT/PLAN  [name] in on Day 9 of antibiotics and currently receiving Fortaz (Day 3) and fluconazole (Day 3) for suspected spontaneous bacterial peritonitis. Cultures of peritoneal fluid show no organisms on gram stain and no growth of cultures x 1 day. He is tolerating the antibiotics well and remains distended in the setting of suspected cirrhosis. He has remained afebrile.  1. Continue ceftazidime and fluconazole pending culture results. If cultures return negative will stop antibiotics.  2. Dialysis per nephrology. 3. Continue to monitor liver function while receiving fluconazole.  4. Anxiety per primary team.    Principal Problem:   SBP (spontaneous bacterial peritonitis) ([place]): PRESUMED. Active Problems:   Alcohol abuse   Chronic hepatitis B (HCC)   Chronic hepatitis C without hepatic coma (HCC)   COPD (chronic obstructive pulmonary disease) (HCC)   Anxiety   Anemia due to chronic kidney disease   ESRD (end stage renal disease) (HCC)   Other cirrhosis of liver (HCC)   Abdominal pain   Liver disease, chronic   Hyponatremia   DNR (do not resuscitate)   Palliative care by specialist   . clopidogrel  75 mg Oral Daily  . [START ON 11/18/2017] darbepoetin (ARANESP) injection - DIALYSIS  200 mcg Intravenous Q Fri-HD  . diltiazem  120 mg Oral Daily  . famotidine  20 mg Oral QHS  . feeding supplement (NEPRO CARB STEADY)  237 mL Oral BID BM  . folic acid  1 mg Oral Daily  . gabapentin  100 mg Oral BID  . heparin  5,000 Units Subcutaneous Q8H  . hydrALAZINE  100 mg Oral BID  . ipratropium-albuterol  3 mL Nebulization BID  . isosorbide mononitrate  30 mg Oral Daily  . mometasone-formoterol  2 puff Inhalation BID  . multivitamin with minerals  1 tablet Oral Daily  . nicotine  21 mg Transdermal Daily  . sodium chloride flush  10-40 mL Intracatheter Q12H  . thiamine  100 mg Oral  Daily   Or  . thiamine  100 mg Intravenous Daily  . vancomycin  125 mg Oral QID    SUBJECTIVE:  Afebrile overnight. Has anxiety that causes him to chew his nails and does not stop until they bleed. Previously on Xanax 5 mg at night to help him sleep. Hungry and would like coffee and graham crackers. Had 2 bowel movements yesterday that were soft and loose.   Allergies  Allergen Reactions  . Aspirin Other (See Comments)    STOMACH PAIN  . Tylenol [Acetaminophen]     Stomach ache  . Clonidine Derivatives Itching  . Tramadol Itching     Review of Systems: Review of Systems  Constitutional: Negative for chills, fever and malaise/fatigue.  Respiratory: Positive for cough. Negative for sputum production, shortness of breath and wheezing.   Cardiovascular: Negative for chest pain and leg swelling.  Gastrointestinal: Positive for diarrhea. Negative for abdominal pain, constipation, nausea and vomiting.  Skin: Negative for rash.  Psychiatric/Behavioral: The patient is nervous/anxious.       OBJECTIVE: Vitals:   11/10/17 2145 11/11/17 0519 11/11/17 0717 11/11/17 0724  BP: 119/85 136/76    Pulse: (!) 115 65 73   Resp: 20 18 16    Temp: 98 F (36.7 C) 98.2 F (36.8 C)    TempSrc: Oral Oral    SpO2: 98% 99% 99% 99%  Weight:  Height:       Body mass index is 24.71 kg/m.  Physical Exam  Constitutional: He appears well-developed and well-nourished. No distress.  Seated on the side of the bed. Pleasant.   Abdominal: Normal appearance and bowel sounds are normal. He exhibits distension. There is no tenderness. There is no rigidity, no rebound, no guarding, no CVA tenderness, no tenderness at McBurney's point and negative Murphy's sign.  Neurological: He is alert.  Skin: Skin is warm and dry.    Lab Results Lab Results  Component Value Date   WBC 9.8 11/08/2017   HGB 8.9 (L) 11/08/2017   HCT 26.2 (L) 11/08/2017   MCV 74.4 (L) 11/08/2017   PLT 155 11/08/2017    Lab  Results  Component Value Date   CREATININE 5.59 (H) 11/10/2017   BUN 53 (H) 11/10/2017   NA 140 11/10/2017   K 3.6 11/10/2017   CL 102 11/10/2017   CO2 21 (L) 11/10/2017    Lab Results  Component Value Date   ALT 27 11/05/2017   AST 22 11/05/2017   ALKPHOS 180 (H) 11/05/2017   BILITOT 1.3 (H) 11/05/2017     Microbiology: Recent Results (from the past 240 hour(s))  Culture, blood (routine x 2)     Status: None   Collection Time: 11/04/17  8:20 AM  Result Value Ref Range Status   Specimen Description BLOOD RIGHT ARM  Final   Special Requests   Final    BOTTLES DRAWN AEROBIC ONLY Blood Culture adequate volume   Culture   Final    NO GROWTH 5 DAYS Performed at [facility], [address] [place], [place] [address]    Report Status 11/09/2017 FINAL  Final  Culture, blood (routine x 2)     Status: None   Collection Time: 11/04/17  8:29 AM  Result Value Ref Range Status   Specimen Description BLOOD RIGHT HAND  Final   Special Requests   Final    BOTTLES DRAWN AEROBIC AND ANAEROBIC Blood Culture adequate volume   Culture   Final    NO GROWTH 5 DAYS Performed at [facility], [place] [address], [place], [place] [address]    Report Status 11/09/2017 FINAL  Final  C difficile quick scan w PCR reflex     Status: Abnormal   Collection Time: 11/04/17  2:53 PM  Result Value Ref Range Status   C Diff antigen POSITIVE (A) NEGATIVE Final   C Diff toxin NEGATIVE NEGATIVE Final   C Diff interpretation Results are indeterminate. See PCR results.  Final  Stool culture (children & immunocomp patients)     Status: None   Collection Time: 11/04/17  2:53 PM  Result Value Ref Range Status   Salmonella/Shigella Screen Final report  Final   Campylobacter Culture Final report  Final   E coli, Shiga toxin Assay Negative Negative Final    Comment: (NOTE) Performed At: [facility] [place], [place] [id number] Rush Farmer MD [id number] Performed at [facility], [place] [address], [place], [place] [address]   C. Diff by PCR, Reflexed     Status: Abnormal   Collection Time: 11/04/17  2:53 PM  Result Value Ref Range Status   Toxigenic C. Difficile by PCR POSITIVE (A) NEGATIVE Final    Comment: Positive for toxigenic C. difficile with little to no toxin production. Only treat if clinical presentation suggests symptomatic illness. Performed at [facility], [place]  [address] [place], [place] [address]   STOOL CULTURE REFLEX - RSASHR     Status: None   Collection Time: 11/04/17  2:53 PM  Result Value Ref Range Status   Stool Culture result 1 (RSASHR) Comment  Final    Comment: (NOTE) No Salmonella or Shigella recovered. Performed At: [facility] [place], [place] [id number] Rush Farmer MD [id number] Performed at [facility], [place] [address], [place], [place] [address]   STOOL CULTURE Reflex - CMPCXR     Status: None   Collection Time: 11/04/17  2:53 PM  Result Value Ref Range Status   Stool Culture result 1 (CMPCXR) Comment  Final    Comment: (NOTE) No Campylobacter species isolated. Performed At: [facility] [place], [place] [id number] Rush Farmer MD [id number] Performed at [facility], [place] [address] [place], [place] [address]   MRSA PCR Screening     Status: None   Collection Time: 11/04/17  8:20 PM  Result Value Ref Range Status   MRSA by PCR NEGATIVE NEGATIVE Final    Comment:        The GeneXpert MRSA Assay (FDA approved for NASAL specimens only), is one component of a comprehensive MRSA colonization surveillance program. It is not intended to diagnose MRSA infection nor to guide or monitor treatment for MRSA infections. Performed at [facility], [place] [address] [place], [place] [address]   Gram stain     Status: None   Collection Time: 11/09/17  2:41 PM  Result Value Ref Range Status   Specimen Description PERITONEAL  Final    Special Requests NONE  Final   Gram Stain   Final    ABUNDANT WBC PRESENT,BOTH PMN AND MONONUCLEAR NO ORGANISMS SEEN Performed at [facility], [address], [place], [place] [address]    Report Status 11/09/2017 FINAL  Final  Culture, body fluid-bottle     Status: None (Preliminary result)   Collection Time: 11/09/17  2:41 PM  Result Value Ref Range Status   Specimen Description PERITONEAL  Final   Special Requests NONE  Final   Culture   Final    NO GROWTH 1 DAY Performed at [facility], [place] [address] [place] [address], [place], [place] [address]    Report Status PENDING  Incomplete     Terri Piedra, [place] [facility] [phone] Pager  11/11/2017  8:47 AM

## 2017-11-11 NOTE — Progress Notes (Signed)
PROGRESS NOTE   [name]  ZSW:[id number]    DOB: 1961-05-04    DOA: 11/04/2017  PCP: [name], MD   I have briefly reviewed patients previous medical records in [facility]  Brief Narrative:  57 year old male with extensive and complex PMH: ESRD on HD, anemia, alcoholism, chronic hepatitis B & C complicated by cirrhosis and ascites, COPD, chronic diastolic CHF by echo 09/30/252 with dilated RV, CAD status post RCA stent 02/22/2017, severe pulmonary hypertension with very dilated RV by echo, PAF, frequent hospitalizations (12 in the last 6 months) recently admitted for abdominal pain, SBP, negative cultures, presented with recurrent abdominal pain and diagnosed with C. difficile colitis this admission.  Nephrology consulted for dialysis needs.  Palliative team on board for goals of care.  Status post paracentesis 3 L by IR on 5/1.   Assessment & Plan:   Principal Problem:   SBP (spontaneous bacterial peritonitis) ([place]): PRESUMED. Active Problems:   Alcohol abuse   Chronic hepatitis B (HCC)   Chronic hepatitis C without hepatic coma (HCC)   COPD (chronic obstructive pulmonary disease) (HCC)   Anxiety   Anemia due to chronic kidney disease   ESRD (end stage renal disease) (HCC)   Other cirrhosis of liver (HCC)   Abdominal pain   Liver disease, chronic   Hyponatremia   DNR (do not resuscitate)   Palliative care by specialist   Suspected C. difficile colitis: Initially treated with oral vancomycin.  ID was consulted on 5/1.  Diarrhea improved.  As per ID, indeterminant C. difficile test and clinical picture does not appear consistent with C. difficile.  I discussed with Dr. Johnnye Sima and oral vancomycin discontinued 5/2.  Alcoholic, hep B & C cirrhosis with ascites/SBP: Status post recent antibiotics.  Underwent ultrasound-guided paracentesis 3 L on 11/09/2017.  Preliminary ascitic fluid results show 2100 WBCs predominantly neutrophilic, concern regarding  persistent SBP.  ID consulted and antimicrobials changed to ceftazidime and fluconazole.  Ascitic fluid cultures, negative to date after 2 days.  I discussed with Dr. Johnnye Sima, ID on 5/3 and he recommended total 14 days of IV ceftazidime across dialysis and can switch to oral fluconazole at discharge 100 mg daily.  ESRD on MWF HD: Nephrology following.  Discussed with Dr. Jonnie Finner, patient has been off scheduled this week due to refusal for HD.  I was advised to keep him overnight for HD in a.m. and then can be discharged home.  Poor maturing right AVF: I discussed with Dr. Valinda Hoar, vascular surgery and his input appreciated.  Plans for fistulogram when infection improves, possibly next week.  Essential hypertension: Mildly controlled at times.  Continue diltiazem, hydralazine, Imdur.  Volume management across HD.  CAD status post stent to RCA: Asymptomatic of chest pain.  Remains on Plavix and Imdur.  Severe pulmonary artery hypertension  Paroxysmal A. fib: Continue diltiazem.  Dyspnea: Noted on 4/30.  Chest x-ray showed atelectasis and mild pulmonary congestion.  Volume management across dialysis.  Anxiety could also be the cause.  Had been empirically started on IV Solu-Medrol but no clinical bronchospasm and hence will discontinue.  Remains on as needed Xanax.  Dyspnea resolved.  Anemia of ESRD: Stable.  COPD: Stable without clinical bronchospasm.  Pain: Controlled at this time.  Adult failure to thrive: Multifactorial due to multiple severe significant comorbidities.  Palliative care consulted on 4/30.  Changed to DNR.  Xanax was increased for significant anxiety.  He then allowed paracentesis which he had declined earlier.  DVT prophylaxis: Subcutaneous  heparin Code Status: DNR Family Communication: None at bedside Disposition: DC home likely 5/3 after hemodialysis.   Consultants:  Nephrology IR Palliative care medicine Infectious disease  Procedures:  Ultrasound-guided  paracentesis by IR 5/1 HD  Antimicrobials:  Oral vancomycin-discontinued 5/3. IV Fortaz across dialysis. IV fluconazole   Subjective: Reports 2-3 soft stools.  Denies abdominal pain or dyspnea.  As per RN, no acute issues.  ROS: As above  Objective:  Vitals:   11/11/17 0519 11/11/17 0717 11/11/17 0724 11/11/17 1347  BP: 136/76   129/90  Pulse: 65 73  91  Resp: 18 16  20   Temp: 98.2 F (36.8 C)   97.6 F (36.4 C)  TempSrc: Oral     SpO2: 99% 99% 99% 98%  Weight:      Height:        Examination:  General exam: Pleasant young male, moderately built and nourished lying comfortably propped up in bed. Respiratory system: Clear to auscultation.  No increased work of breathing.  Right TDC.  Cardiovascular system: S1 & S2 heard, RRR. No JVD, murmurs, rubs, gallops or clicks. No pedal edema.  Stable Gastrointestinal system: Abdomen is moderately distended and not tense, soft and nontender. No organomegaly or masses felt. Normal bowel sounds heard.  Ascites +. Central nervous system: Alert and oriented. No focal neurological deficits. Extremities: Symmetric 5 x 5 power.  Left upper extremity AV fistula. Skin: No rashes, lesions or ulcers Psychiatry: Judgement and insight appear normal. Mood & affect appropriate.     Data Reviewed: I have personally reviewed following labs and imaging studies  CBC: Recent Labs  Lab 11/05/17 0651 11/06/17 0434 11/08/17 0728  WBC 12.9* 11.1* 9.8  NEUTROABS 9.7*  --   --   HGB 9.4* 8.9* 8.9*  HCT 27.9* 26.2* 26.2*  MCV 75.0* 75.7* 74.4*  PLT 156 130* 696   Basic Metabolic Panel: Recent Labs  Lab 11/04/17 1727 11/05/17 0651 11/06/17 0434 11/08/17 0728 11/10/17 1025  NA 129* 125* 131* 129* 140  K 5.4* 5.3* 4.4 5.4* 3.6  CL 91* 89* 90* 87* 102  CO2 23 22 24 25  21*  GLUCOSE 120* 79 114* 95 149*  BUN 50* 60* 36* 59* 53*  CREATININE 6.87* 7.72* 5.47* 8.28* 5.59*  CALCIUM 8.9 9.0 8.4* 9.1 6.3*  MG  --  2.4  --   --   --   PHOS  --    --   --  4.5 3.2   Liver Function Tests: Recent Labs  Lab 11/04/17 1727 11/05/17 0651 11/08/17 0728 11/10/17 1025  AST 28 22  --   --   ALT 32 27  --   --   ALKPHOS 181* 180*  --   --   BILITOT 1.3* 1.3*  --   --   PROT 7.0 6.8  --   --   ALBUMIN 3.3* 3.2* 3.0* 2.4*   Coagulation Profile: Recent Labs  Lab 11/05/17 0651  INR 1.32   CBG: Recent Labs  Lab 11/05/17 2003 11/06/17 0815 11/07/17 0909 11/07/17 1659 11/09/17 0801  GLUCAP 137* 118* 104* 102* 169*    Recent Results (from the past 240 hour(s))  Culture, blood (routine x 2)     Status: None   Collection Time: 11/04/17  8:20 AM  Result Value Ref Range Status   Specimen Description BLOOD RIGHT ARM  Final   Special Requests   Final    BOTTLES DRAWN AEROBIC ONLY Blood Culture adequate volume   Culture  Final    NO GROWTH 5 DAYS Performed at [facility], [place] [address] [place], [place] [address]    Report Status 11/09/2017 FINAL  Final  Culture, blood (routine x 2)     Status: None   Collection Time: 11/04/17  8:29 AM  Result Value Ref Range Status   Specimen Description BLOOD RIGHT HAND  Final   Special Requests   Final    BOTTLES DRAWN AEROBIC AND ANAEROBIC Blood Culture adequate volume   Culture   Final    NO GROWTH 5 DAYS Performed at [facility], [place] [address], [place], [place] [address]    Report Status 11/09/2017 FINAL  Final  C difficile quick scan w PCR reflex     Status: Abnormal   Collection Time: 11/04/17  2:53 PM  Result Value Ref Range Status   C Diff antigen POSITIVE (A) NEGATIVE Final   C Diff toxin NEGATIVE NEGATIVE Final   C Diff interpretation Results are indeterminate. See PCR results.  Final  Stool culture (children & immunocomp patients)     Status: None   Collection Time: 11/04/17  2:53 PM  Result Value Ref Range Status   Salmonella/Shigella Screen Final report  Final   Campylobacter Culture Final report  Final   E coli, Shiga toxin Assay Negative Negative  Final    Comment: (NOTE) Performed At: [facility] [place], [place] [id number] Rush Farmer MD [id number] Performed at [facility], [place] [address] [place], [place] [address]   C. Diff by PCR, Reflexed     Status: Abnormal   Collection Time: 11/04/17  2:53 PM  Result Value Ref Range Status   Toxigenic C. Difficile by PCR POSITIVE (A) NEGATIVE Final    Comment: Positive for toxigenic C. difficile with little to no toxin production. Only treat if clinical presentation suggests symptomatic illness. Performed at [facility], [place] [address] [place], [place] [address]   STOOL CULTURE REFLEX - RSASHR     Status: None   Collection Time: 11/04/17  2:53 PM  Result Value Ref Range Status   Stool Culture result 1 (RSASHR) Comment  Final    Comment: (NOTE) No Salmonella or Shigella recovered. Performed At: [facility] [place], [place] [id number] Rush Farmer MD [id number] Performed at [facility], [place] [address], [place], [place] [address]   STOOL CULTURE Reflex - CMPCXR     Status: None   Collection Time: 11/04/17  2:53 PM  Result Value Ref Range Status   Stool Culture result 1 (CMPCXR) Comment  Final    Comment: (NOTE) No Campylobacter species isolated. Performed At: [facility] [place], [place] [id number] Rush Farmer MD [id number] Performed at [facility], [place] [address] [place], [place] [address]   MRSA PCR Screening     Status: None   Collection Time: 11/04/17  8:20 PM  Result Value Ref Range Status   MRSA by PCR NEGATIVE NEGATIVE Final    Comment:        The GeneXpert MRSA Assay (FDA approved for NASAL specimens only), is one component of a comprehensive MRSA colonization surveillance program. It is not intended to diagnose MRSA infection nor to guide or monitor treatment for MRSA infections. Performed at [facility], [place] [address]  [place], [place] [address]   Gram stain     Status: None   Collection Time: 11/09/17  2:41 PM  Result Value Ref Range Status   Specimen Description PERITONEAL  Final   Special Requests NONE  Final   Gram Stain   Final    ABUNDANT WBC PRESENT,BOTH PMN AND MONONUCLEAR NO ORGANISMS SEEN Performed at [facility], [address], [place], [place] [address]    Report Status 11/09/2017 FINAL  Final  Culture, body fluid-bottle     Status: None (Preliminary result)   Collection Time: 11/09/17  2:41 PM  Result Value Ref Range Status   Specimen Description PERITONEAL  Final   Special Requests NONE  Final   Culture   Final    NO GROWTH 2 DAYS Performed at [place] [address], [place], [place] [address]    Report Status PENDING  Incomplete         Radiology Studies: No results found.      Scheduled Meds: . clopidogrel  75 mg Oral Daily  . [START ON 11/18/2017] darbepoetin (ARANESP) injection - DIALYSIS  200 mcg Intravenous Q Fri-HD  . diltiazem  120 mg Oral Daily  . famotidine  20 mg Oral QHS  . feeding supplement (NEPRO CARB STEADY)  237 mL Oral BID BM  . folic acid  1 mg Oral Daily  . gabapentin  100 mg Oral BID  . heparin  5,000 Units Subcutaneous Q8H  . hydrALAZINE  100 mg Oral BID  . ipratropium-albuterol  3 mL Nebulization BID  . isosorbide mononitrate  30 mg Oral Daily  . mometasone-formoterol  2 puff Inhalation BID  . multivitamin with minerals  1 tablet Oral Daily  . nicotine  21 mg Transdermal Daily  . sevelamer carbonate  3,200 mg Oral TID WC  . sodium chloride flush  10-40 mL Intracatheter Q12H  . thiamine  100 mg Oral Daily   Or  . thiamine  100 mg Intravenous Daily   Continuous Infusions: . cefTAZidime (FORTAZ)  IV Stopped (11/10/17 1700)  . fluconazole (DIFLUCAN) IV Stopped (11/10/17 1847)     LOS: 7 days     Vernell Leep, MD, FACP, [name]. [facility] Pager [phone]  If 7PM-7AM, please contact  night-coverage [email or web address] Password [facility] 11/11/2017, 4:33 PM

## 2017-11-11 NOTE — Progress Notes (Addendum)
[facility] Progress Note  Subjective:  No new c/os  Fluid cx no growth to date  Likely discharge tomorrow after HD   Vitals:   11/10/17 2145 11/11/17 0519 11/11/17 0717 11/11/17 0724  BP: 119/85 136/76    Pulse: (!) 115 65 73   Resp: 20 18 16    Temp: 98 F (36.7 C) 98.2 F (36.8 C)    TempSrc: Oral Oral    SpO2: 98% 99% 99% 99%  Weight:      Height:        Inpatient medications: . clopidogrel  75 mg Oral Daily  . [START ON 11/18/2017] darbepoetin (ARANESP) injection - DIALYSIS  200 mcg Intravenous Q Fri-HD  . diltiazem  120 mg Oral Daily  . famotidine  20 mg Oral QHS  . feeding supplement (NEPRO CARB STEADY)  237 mL Oral BID BM  . folic acid  1 mg Oral Daily  . gabapentin  100 mg Oral BID  . heparin  5,000 Units Subcutaneous Q8H  . hydrALAZINE  100 mg Oral BID  . ipratropium-albuterol  3 mL Nebulization BID  . isosorbide mononitrate  30 mg Oral Daily  . mometasone-formoterol  2 puff Inhalation BID  . multivitamin with minerals  1 tablet Oral Daily  . nicotine  21 mg Transdermal Daily  . sevelamer carbonate  3,200 mg Oral TID WC  . sodium chloride flush  10-40 mL Intracatheter Q12H  . thiamine  100 mg Oral Daily   Or  . thiamine  100 mg Intravenous Daily   . cefTAZidime (FORTAZ)  IV Stopped (11/10/17 1700)  . fluconazole (DIFLUCAN) IV Stopped (11/10/17 1847)   albuterol, ALPRAZolam, bisacodyl, guaiFENesin, hydrocortisone cream, HYDROmorphone (DILAUDID) injection, ondansetron **OR** ondansetron (ZOFRAN) IV, oxyCODONE  Exam:  Alert, no distress Thin No jvd Chest clear to bases bilat RRR  abd soft mod protuberant nontender mild ascites Ext no leg edema Right upper arm avf w/ bruit   Dialysis: [facility] 4h  2/2 bath  69kg  400/800   Tdc/R avf (Aug 09 2017)   heparin none -venofer 100mg  IV x10  -mircera 221mcg IV q 2 weeks (last 4/17) -parsabiv 5mg  IV TIW  -BMM Renvela 3 qac 2 q snacks -calcitriol 0.5       Impression: 1 cdif / diarrhea,  improved  po vanc stopped.  2 spont bact peritonitis- sp recent abx course. Repeat para/fluid cx 5/1 neg to date. ceftazidime/fluconazole started per ID  3 esrd hd mwf --off schedule this week d/t pt refusals.  4 poorly maturing R AVF - seen by [facility] and will need fistulogram when infection improves  5 cirrhosis from etoh and hep b 6 hypertension/ vol - cont hydralazine and diltiazem, weights variable, needs standing weights  7 anemia Aranesp 200 dosed on 5/2  8 mbd of ckd ca low this am 9.1>6.3, recheck before discharge  9 cad sp stent to rca 10 severe pulm htn 11 parox afib - diltiazem  Plan - HD tomorrow before discharge then back to regular OP schedule.   Lynnda Child PA-C [facility] Pager [phone] 11/11/2017,12:35 PM  Pt seen, examined and agree w A/P as above.  [name] MD [facility] pager [phone]   11/11/2017, 1:26 PM     Recent Labs  Lab 11/06/17 0434 11/08/17 0728 11/10/17 1025  NA 131* 129* 140  K 4.4 5.4* 3.6  CL 90* 87* 102  CO2 24 25 21*  GLUCOSE 114* 95 149*  BUN 36* 59*  53*  CREATININE 5.47* 8.28* 5.59*  CALCIUM 8.4* 9.1 6.3*  PHOS  --  4.5 3.2   Recent Labs  Lab 11/04/17 1727 11/05/17 0651 11/08/17 0728 11/10/17 1025  AST 28 22  --   --   ALT 32 27  --   --   ALKPHOS 181* 180*  --   --   BILITOT 1.3* 1.3*  --   --   PROT 7.0 6.8  --   --   ALBUMIN 3.3* 3.2* 3.0* 2.4*   Recent Labs  Lab 11/05/17 0651 11/06/17 0434 11/08/17 0728  WBC 12.9* 11.1* 9.8  NEUTROABS 9.7*  --   --   HGB 9.4* 8.9* 8.9*  HCT 27.9* 26.2* 26.2*  MCV 75.0* 75.7* 74.4*  PLT 156 130* 155   Iron/TIBC/Ferritin/ %Sat    Component Value Date/Time   IRON 104 09/03/2017 0800   TIBC 343 09/03/2017 0800   FERRITIN 513 (H) 09/03/2017 0800   IRONPCTSAT 30 09/03/2017 0800

## 2017-11-12 ENCOUNTER — Encounter (HOSPITAL_COMMUNITY): Payer: Self-pay | Admitting: Emergency Medicine

## 2017-11-12 ENCOUNTER — Emergency Department (HOSPITAL_COMMUNITY)
Admission: EM | Admit: 2017-11-12 | Discharge: 2017-11-13 | Disposition: A | Discharge status: Home / Self Care | Payer: Medicare Other | Source: Home / Self Care | Attending: Emergency Medicine | Admitting: Emergency Medicine

## 2017-11-12 DIAGNOSIS — I5032 Chronic diastolic (congestive) heart failure: Secondary | ICD-10-CM | POA: Diagnosis not present

## 2017-11-12 DIAGNOSIS — I120 Hypertensive chronic kidney disease with stage 5 chronic kidney disease or end stage renal disease: Secondary | ICD-10-CM | POA: Insufficient documentation

## 2017-11-12 DIAGNOSIS — Z79899 Other long term (current) drug therapy: Secondary | ICD-10-CM | POA: Insufficient documentation

## 2017-11-12 DIAGNOSIS — Z7902 Long term (current) use of antithrombotics/antiplatelets: Secondary | ICD-10-CM | POA: Insufficient documentation

## 2017-11-12 DIAGNOSIS — J449 Chronic obstructive pulmonary disease, unspecified: Secondary | ICD-10-CM | POA: Insufficient documentation

## 2017-11-12 DIAGNOSIS — N186 End stage renal disease: Secondary | ICD-10-CM | POA: Insufficient documentation

## 2017-11-12 DIAGNOSIS — Z992 Dependence on renal dialysis: Secondary | ICD-10-CM | POA: Insufficient documentation

## 2017-11-12 DIAGNOSIS — R188 Other ascites: Secondary | ICD-10-CM | POA: Diagnosis not present

## 2017-11-12 DIAGNOSIS — R05 Cough: Secondary | ICD-10-CM | POA: Diagnosis not present

## 2017-11-12 DIAGNOSIS — I132 Hypertensive heart and chronic kidney disease with heart failure and with stage 5 chronic kidney disease, or end stage renal disease: Secondary | ICD-10-CM | POA: Diagnosis not present

## 2017-11-12 DIAGNOSIS — I259 Chronic ischemic heart disease, unspecified: Secondary | ICD-10-CM | POA: Insufficient documentation

## 2017-11-12 DIAGNOSIS — F17210 Nicotine dependence, cigarettes, uncomplicated: Secondary | ICD-10-CM

## 2017-11-12 DIAGNOSIS — R000 Tachycardia, unspecified: Secondary | ICD-10-CM | POA: Diagnosis not present

## 2017-11-12 DIAGNOSIS — E871 Hypo-osmolality and hyponatremia: Secondary | ICD-10-CM

## 2017-11-12 DIAGNOSIS — A419 Sepsis, unspecified organism: Secondary | ICD-10-CM | POA: Diagnosis not present

## 2017-11-12 DIAGNOSIS — K652 Spontaneous bacterial peritonitis: Secondary | ICD-10-CM | POA: Insufficient documentation

## 2017-11-12 DIAGNOSIS — R110 Nausea: Secondary | ICD-10-CM

## 2017-11-12 DIAGNOSIS — B181 Chronic viral hepatitis B without delta-agent: Secondary | ICD-10-CM | POA: Diagnosis not present

## 2017-11-12 LAB — CBC
HCT: 26.2 % — ABNORMAL LOW (ref 39.0–52.0)
HCT: 26 % — ABNORMAL LOW (ref 39.0–52.0)
Hemoglobin: 8.8 g/dL — ABNORMAL LOW (ref 13.0–17.0)
Hemoglobin: 8.8 g/dL — ABNORMAL LOW (ref 13.0–17.0)
MCH: 24.9 pg — ABNORMAL LOW (ref 26.0–34.0)
MCH: 25.1 pg — ABNORMAL LOW (ref 26.0–34.0)
MCHC: 33.6 g/dL (ref 30.0–36.0)
MCHC: 33.8 g/dL (ref 30.0–36.0)
MCV: 74.2 fL — ABNORMAL LOW (ref 78.0–100.0)
MCV: 74.3 fL — ABNORMAL LOW (ref 78.0–100.0)
Platelets: 215 10*3/uL (ref 150–400)
Platelets: 198 10*3/uL (ref 150–400)
RBC: 3.53 MIL/uL — ABNORMAL LOW (ref 4.22–5.81)
RBC: 3.5 MIL/uL — ABNORMAL LOW (ref 4.22–5.81)
RDW: 21.4 % — ABNORMAL HIGH (ref 11.5–15.5)
RDW: 21.7 % — ABNORMAL HIGH (ref 11.5–15.5)
WBC: 15.1 10*3/uL — ABNORMAL HIGH (ref 4.0–10.5)
WBC: 13 10*3/uL — ABNORMAL HIGH (ref 4.0–10.5)

## 2017-11-12 LAB — COMPREHENSIVE METABOLIC PANEL
ALT: 27 U/L (ref 17–63)
AST: 40 U/L (ref 15–41)
Albumin: 3 g/dL — ABNORMAL LOW (ref 3.5–5.0)
Alkaline Phosphatase: 215 U/L — ABNORMAL HIGH (ref 38–126)
Anion gap: 12 (ref 5–15)
BUN: 32 mg/dL — ABNORMAL HIGH (ref 6–20)
CO2: 25 mmol/L (ref 22–32)
Calcium: 9 mg/dL (ref 8.9–10.3)
Chloride: 93 mmol/L — ABNORMAL LOW (ref 101–111)
Creatinine, Ser: 4.53 mg/dL — ABNORMAL HIGH (ref 0.61–1.24)
GFR calc Af Amer: 15 mL/min — ABNORMAL LOW (ref >60)
GFR calc non Af Amer: 13 mL/min — ABNORMAL LOW (ref >60)
Glucose, Bld: 137 mg/dL — ABNORMAL HIGH (ref 65–99)
Potassium: 5.1 mmol/L (ref 3.5–5.1)
Sodium: 130 mmol/L — ABNORMAL LOW (ref 135–145)
Total Bilirubin: 0.8 mg/dL (ref 0.3–1.2)
Total Protein: 6.7 g/dL (ref 6.5–8.1)

## 2017-11-12 LAB — RENAL FUNCTION PANEL
Albumin: 3.1 g/dL — ABNORMAL LOW (ref 3.5–5.0)
Anion gap: 17 — ABNORMAL HIGH (ref 5–15)
BUN: 57 mg/dL — ABNORMAL HIGH (ref 6–20)
CO2: 27 mmol/L (ref 22–32)
Calcium: 9.5 mg/dL (ref 8.9–10.3)
Chloride: 83 mmol/L — ABNORMAL LOW (ref 101–111)
Creatinine, Ser: 6.29 mg/dL — ABNORMAL HIGH (ref 0.61–1.24)
GFR calc Af Amer: 10 mL/min — ABNORMAL LOW (ref >60)
GFR calc non Af Amer: 9 mL/min — ABNORMAL LOW (ref >60)
Glucose, Bld: 124 mg/dL — ABNORMAL HIGH (ref 65–99)
Phosphorus: 2.7 mg/dL (ref 2.5–4.6)
Potassium: 3.7 mmol/L (ref 3.5–5.1)
Sodium: 127 mmol/L — ABNORMAL LOW (ref 135–145)

## 2017-11-12 LAB — LIPASE, BLOOD: Lipase: 26 U/L (ref 11–51)

## 2017-11-12 MED ORDER — LIDOCAINE HCL (PF) 1 % IJ SOLN: 5.0000 mL | INTRAMUSCULAR | Status: DC | PRN

## 2017-11-12 MED ORDER — SODIUM CHLORIDE 0.9 % IV SOLN: 100.0000 mL | INTRAVENOUS | Status: DC | PRN

## 2017-11-12 MED ORDER — LIDOCAINE-PRILOCAINE 2.5-2.5 % EX CREA: 1.0000 "application " | TOPICAL_CREAM | CUTANEOUS | Status: DC | PRN

## 2017-11-12 MED ORDER — ALTEPLASE 2 MG IJ SOLR: 2.0000 mg | Freq: Once | INTRAMUSCULAR | Status: DC | PRN

## 2017-11-12 MED ORDER — HEPARIN SODIUM (PORCINE) 1000 UNIT/ML DIALYSIS: 1000.0000 [IU] | INTRAMUSCULAR | Status: DC | PRN

## 2017-11-12 MED ORDER — PENTAFLUOROPROP-TETRAFLUOROETH EX AERO: 1.0000 "application " | INHALATION_SPRAY | CUTANEOUS | Status: DC | PRN

## 2017-11-12 NOTE — Discharge Summary (Signed)
Physician Discharge Summary  Tracie Lindbloom PJK:[id number] DOB: 11-15-1960  PCP: [name], MD  Admit date: 11/04/2017 Discharge date: 11/12/2017   Patient abruptly left the hospital [place].  Recommendations for Outpatient Follow-up:  1. Unable to provide follow-up instructions since patient returned from HD and told the nurse that he was "getting the hell out of here" and abruptly left the hospital AMA. 2. I hope he keeps up his regular hemodialysis appointments on Mondays, Wednesdays and Fridays. 3. Continue IV ceftazidime at dialysis MWF for 14 days with start date 11/09/2017.   4. Although oral fluconazole 100 mg daily was recommended by ID for 14 days, planned to check EKG today due to prolonged QTC and plan to discuss again with ID but he left AMA.  Thereby would not continue fluconazole until discussing with ID/Dr. Johnnye Sima during next HD visit. 5. Follow-up final ascitic fluid culture results.   Home Health: N/A Equipment/Devices: N/A    Discharge Condition: He had clinically improved and was stable but at risk for decline due to not completing treatment including death. CODE STATUS: DNR Diet recommendation: Should be on heart healthy/renal diet.  Left without DC instructions.  Discharge Diagnoses:  Principal Problem:   SBP (spontaneous bacterial peritonitis) ([place]): PRESUMED. Active Problems:   Alcohol abuse   Chronic hepatitis B (HCC)   Chronic hepatitis C without hepatic coma (HCC)   COPD (chronic obstructive pulmonary disease) (HCC)   Anxiety   Anemia due to chronic kidney disease   ESRD (end stage renal disease) (HCC)   Other cirrhosis of liver (HCC)   Abdominal pain   Liver disease, chronic   Hyponatremia   DNR (do not resuscitate)   Palliative care by specialist   Brief Summary: 57 year old male with extensive and complex PMH: ESRD on HD, anemia, alcoholism, chronic hepatitis B & C complicated by cirrhosis and ascites, COPD, chronic  diastolic CHF by echo 02/18/9832 with dilated RV, CAD status post RCA stent 02/22/2017, severe pulmonary hypertension with very dilated RV by echo, PAF, frequent hospitalizations (12 in the last 6 months) recently admitted for abdominal pain, SBP, negative cultures, presented with recurrent abdominal pain and diagnosed with C. difficile colitis this admission.  Nephrology consulted for dialysis needs.  Palliative team on board for goals of care.  Status post paracentesis 3 L by IR on 5/1.   Assessment & Plan:   Low index of suspicion for C. difficile colitis:  C. difficile antigen positive, toxin negative, PCR positive.  Stool E. coli toxin testing negative, stool culture negative for Salmonella, Shigella and Campylobacter.  Diarrhea initially treated with oral vancomycin.  ID was consulted on 5/1.  Diarrhea improved.  As per ID, indeterminant C. difficile test and clinical picture does not appear consistent with C. difficile.  I discussed with Dr. Johnnye Sima and oral vancomycin discontinued 5/2.  Diarrhea improved/resolved.  Alcoholic, hep B & C cirrhosis with ascites/SBP: Status post recent antibiotics.  Underwent ultrasound-guided paracentesis 3 L on 11/09/2017.  Preliminary ascitic fluid results showed 2100 WBCs predominantly neutrophilic, concern regarding persistent SBP.  ID consulted and antimicrobials changed to ceftazidime and fluconazole.  Ascitic fluid cultures, negative to date after 2 days.  I discussed with Dr. Johnnye Sima, ID on 5/3 and he recommended total 14 days of IV Ceftazidime across dialysis and can switch to oral fluconazole at discharge 100 mg daily.  Patient received antibiotics across dialysis but then abruptly left without formal discharge.  Please see discussion above regarding antibiotics.  He had clinically improved.  ESRD on MWF HD: Nephrology consulted for dialysis needs.  Hyponatremia likely related to ESRD and dialysis.  He completed dialysis on morning of leaving AMA 5/4.  Poor  maturing right AVF: I discussed with Dr. Valinda Hoar, vascular surgery and his input appreciated.  Plans for fistulogram when infection improves, possibly next week.  Essential hypertension: Mildly controlled at times.  Continue diltiazem, hydralazine, Imdur.  Volume management across HD.  CAD status post stent to RCA: Asymptomatic of chest pain.  Remains on Plavix and Imdur.  Severe pulmonary artery hypertension  Paroxysmal A. fib: Continue diltiazem.  Dyspnea: Noted on 4/30.  Chest x-ray showed atelectasis and mild pulmonary congestion.  Volume management across dialysis.  Anxiety could also be the cause.  Had been empirically started on IV Solu-Medrol but no clinical bronchospasm and hence will discontinue.  Remains on as needed Xanax.  Dyspnea resolved.  Anemia of ESRD: Stable.  COPD: Stable without clinical bronchospasm.  Pain: Controlled at this time.  Adult failure to thrive: Multifactorial due to multiple severe significant comorbidities.  Palliative care consulted on 4/30.  Changed to DNR.  Xanax was increased for significant anxiety.  He then allowed paracentesis which he had declined earlier.   Consultants:  Nephrology IR Palliative care medicine Infectious disease  Procedures:  Ultrasound-guided paracentesis by IR 5/1 HD    Discharge Instructions  Patient returned from HD and abruptly left AMA without waiting for DC instructions.   Allergies  Allergen Reactions  . Aspirin Other (See Comments)    STOMACH PAIN  . Tylenol [Acetaminophen]     Stomach ache  . Clonidine Derivatives Itching  . Tramadol Itching      Procedures/Studies: Dg Chest Port 1 View  Result Date: 11/08/2017 CLINICAL DATA:  Shortness of breath.  Smoker EXAM: PORTABLE CHEST 1 VIEW COMPARISON:  11/04/2017 FINDINGS: Decreased inspiration. Stable enlarged cardiac silhouette. The right jugular double-lumen catheter tips remain in the right atrium, currently in the mid to lower right  atrium. Stable prominence of the interstitial markings and mild prominence of the pulmonary vasculature. Minimal left basilar linear atelectasis and possible small left pleural effusion. Diffuse osteopenia. Atheromatous arterial calcifications. IMPRESSION: 1. Decreased inspiration with stable mild elevation of the left hemidiaphragm and interval minimal left basilar linear atelectasis or scarring and possible small left pleural effusion. 2. Stable cardiomegaly, mild pulmonary vascular congestion and mild chronic interstitial lung disease. Electronically Signed   By: Claudie Revering M.D.   On: 11/08/2017 15:38   Dg Chest Port 1 View  Result Date: 11/04/2017 CLINICAL DATA:  Productive cough with generalized weakness and dyspnea. EXAM: PORTABLE CHEST 1 VIEW COMPARISON:  10/29/2017 FINDINGS: Stable cardiomegaly with aortic atherosclerosis. Mild interstitial edema. No pulmonary consolidation. Left basilar atelectasis is identified with slight elevation of the left hemidiaphragm, chronic in appearance. Right IJ dialysis catheter tip remains in the mid right atrium. No acute osseous abnormality. IMPRESSION: Cardiomegaly with mild interstitial edema. Aortic atherosclerosis. Dialysis catheter tip in the mid right atrium. Electronically Signed   By: Ashley Royalty M.D.   On: 11/04/2017 03:23   Ir Abdomen US Limited  Result Date: 11/09/2017 CLINICAL DATA:  History of cirrhosis with recurrent symptomatic ascites. Please perform ultrasound-guided paracentesis for therapeutic purposes. EXAM: LIMITED ABDOMEN ULTRASOUND FOR ASCITES TECHNIQUE: Limited ultrasound survey for ascites was performed in all four abdominal quadrants. COMPARISON:  Ascites search ultrasound-11/04/2017; ultrasound-guided paracentesis-10/28/2017; CT abdomen and pelvis - 10/25/2017 FINDINGS: Sonographic evaluation of the abdomen demonstrates a small amount of predominantly perihepatic intra-abdominal ascites. While the amount  of fluid is likely amenable to  ultrasound-guided paracentesis, the patient refused the procedure and as such, no procedure was performed. IMPRESSION: Small amount of intra-abdominal ascites, potentially amenable to ultrasound-guided paracentesis however the present time, the patient refuses to undergo the paracentesis Referring physician was made aware of patient's request. Electronically Signed   By: Sandi Mariscal M.D.   On: 11/09/2017 10:20   Ir Abdomen US Limited  Result Date: 11/04/2017 CLINICAL DATA:  Abdominal pain, recurrent abdominal ascites. Paracentesis requested. EXAM: LIMITED ABDOMEN ULTRASOUND FOR ASCITES TECHNIQUE: Limited ultrasound survey for ascites was performed in all four abdominal quadrants. COMPARISON:  10/28/2017 FINDINGS: Survey ultrasound demonstrates a small amount of scattered abdominal ascites. No large pocket to allow safe therapeutic paracentesis. IMPRESSION: Small amount of scattered abdominal ascites. Therapeutic paracentesis deferred. Electronically Signed   By: Lucrezia Europe M.D.   On: 11/04/2017 09:44   Ir Paracentesis  Result Date: 11/09/2017 INDICATION: Recurrent symptomatic ascites. Please perform ultrasound-guided paracentesis for diagnostic and therapeutic purposes. EXAM: ULTRASOUND-GUIDED PARACENTESIS COMPARISON:  Ultrasound-guided paracentesis - 10/28/2017 CT abdomen and pelvis - 10/25/2017 MEDICATIONS: None. COMPLICATIONS: None immediate. TECHNIQUE: Informed written consent was obtained from the patient after a discussion of the risks, benefits and alternatives to treatment. A timeout was performed prior to the initiation of the procedure. Initial ultrasound scanning demonstrates a small to moderate amount of ascites within the right lower abdominal quadrant. The right lower abdomen was prepped and draped in the usual sterile fashion. 1% lidocaine with epinephrine was used for local anesthesia. Under direct ultrasound guidance, a 19 gauge, 7-cm, Yueh catheter was introduced. An ultrasound image was  saved for documentation purposed. The paracentesis was performed. The catheter was removed and a dressing was applied. The patient tolerated the procedure well without immediate post procedural complication. FINDINGS: A total of approximately 3 liters of serous fluid was removed. Samples were sent to the laboratory as requested by the clinical team. IMPRESSION: Successful ultrasound-guided paracentesis yielding 3 liters of peritoneal fluid. Electronically Signed   By: Sandi Mariscal M.D.   On: 11/09/2017 15:43    Subjective: Patient was seen this morning at HD.  He was anxious to get off of HD.  He denied complaints.  Reported 3 soft BMs in the last 24 hours.  No pain reported.  No dyspnea.  I advised him that he would be discharged later in the day after he completes dialysis and returns to his hospital bed.  He verbalized understanding.  Discharge Exam:  Vitals:   11/12/17 0930 11/12/17 1000 11/12/17 1030 11/12/17 1036  BP: 127/88 (!) 145/55 (!) 163/88 (!) 154/72  Pulse: 84 81 86 87  Resp:    18  Temp:    97.8 F (36.6 C)  TempSrc:    Oral  SpO2:    97%  Weight:    75.8 kg (167 lb 1.7 oz)  Height:        General exam: Pleasant young male, moderately built and nourished lying comfortably propped up in bed undergoing HD this morning. Respiratory system: Clear to auscultation.  No increased work of breathing.  Right TDC.  Cardiovascular system: S1 & S2 heard, RRR. No JVD, murmurs, rubs, gallops or clicks. No pedal edema. Gastrointestinal system: Abdomen is moderately distended and not tense, tympanitic, soft and nontender. No organomegaly or masses felt. Normal bowel sounds heard.  Ascites +. Central nervous system: Alert and oriented. No focal neurological deficits. Extremities: Symmetric 5 x 5 power.  Left upper extremity AV fistula. Skin: No rashes,  lesions or ulcers Psychiatry: Judgement and insight appear normal. Mood & affect appropriate.       The results of significant  diagnostics from this hospitalization (including imaging, microbiology, ancillary and laboratory) are listed below for reference.     Microbiology: Recent Results (from the past 240 hour(s))  Culture, blood (routine x 2)     Status: None   Collection Time: 11/04/17  8:20 AM  Result Value Ref Range Status   Specimen Description BLOOD RIGHT ARM  Final   Special Requests   Final    BOTTLES DRAWN AEROBIC ONLY Blood Culture adequate volume   Culture   Final    NO GROWTH 5 DAYS Performed at [facility], [address], [place], [place] [address]    Report Status 11/09/2017 FINAL  Final  Culture, blood (routine x 2)     Status: None   Collection Time: 11/04/17  8:29 AM  Result Value Ref Range Status   Specimen Description BLOOD RIGHT HAND  Final   Special Requests   Final    BOTTLES DRAWN AEROBIC AND ANAEROBIC Blood Culture adequate volume   Culture   Final    NO GROWTH 5 DAYS Performed at [facility], [place] [address] [place], [place] [address]    Report Status 11/09/2017 FINAL  Final  C difficile quick scan w PCR reflex     Status: Abnormal   Collection Time: 11/04/17  2:53 PM  Result Value Ref Range Status   C Diff antigen POSITIVE (A) NEGATIVE Final   C Diff toxin NEGATIVE NEGATIVE Final   C Diff interpretation Results are indeterminate. See PCR results.  Final  Stool culture (children & immunocomp patients)     Status: None   Collection Time: 11/04/17  2:53 PM  Result Value Ref Range Status   Salmonella/Shigella Screen Final report  Final   Campylobacter Culture Final report  Final   E coli, Shiga toxin Assay Negative Negative Final    Comment: (NOTE) Performed At: [facility] [place], [place] [id number] Rush Farmer MD [id number] Performed at [facility], [place] [address] [place], [place] [address]   C. Diff by PCR, Reflexed     Status: Abnormal   Collection Time: 11/04/17  2:53 PM  Result Value Ref Range Status    Toxigenic C. Difficile by PCR POSITIVE (A) NEGATIVE Final    Comment: Positive for toxigenic C. difficile with little to no toxin production. Only treat if clinical presentation suggests symptomatic illness. Performed at [facility], [place] [address] [place], [place] [address]   STOOL CULTURE REFLEX - RSASHR     Status: None   Collection Time: 11/04/17  2:53 PM  Result Value Ref Range Status   Stool Culture result 1 (RSASHR) Comment  Final    Comment: (NOTE) No Salmonella or Shigella recovered. Performed At: [facility] [place], [place] [id number] Rush Farmer MD [id number] Performed at [facility], [place] [address], [place], [place] [address]   STOOL CULTURE Reflex - CMPCXR     Status: None   Collection Time: 11/04/17  2:53 PM  Result Value Ref Range Status   Stool Culture result 1 (CMPCXR) Comment  Final    Comment: (NOTE) No Campylobacter species isolated. Performed At: [facility] [place], [place] [id number] Rush Farmer MD [id number] Performed at [facility], [place] [address] [place], [place] [address]   MRSA PCR  Screening     Status: None   Collection Time: 11/04/17  8:20 PM  Result Value Ref Range Status   MRSA by PCR NEGATIVE NEGATIVE Final    Comment:        The GeneXpert MRSA Assay (FDA approved for NASAL specimens only), is one component of a comprehensive MRSA colonization surveillance program. It is not intended to diagnose MRSA infection nor to guide or monitor treatment for MRSA infections. Performed at [facility], [place] [address], [place], [place] [address]   Gram stain     Status: None   Collection Time: 11/09/17  2:41 PM  Result Value Ref Range Status   Specimen Description PERITONEAL  Final   Special Requests NONE  Final   Gram Stain   Final    ABUNDANT WBC PRESENT,BOTH PMN AND MONONUCLEAR NO ORGANISMS SEEN Performed at [facility], [address]  [place], [place] [address]    Report Status 11/09/2017 FINAL  Final  Culture, body fluid-bottle     Status: None (Preliminary result)   Collection Time: 11/09/17  2:41 PM  Result Value Ref Range Status   Specimen Description PERITONEAL  Final   Special Requests NONE  Final   Culture   Final    NO GROWTH 2 DAYS Performed at [place] [address] [place], [place] [address]    Report Status PENDING  Incomplete     Labs: CBC: Recent Labs  Lab 11/06/17 0434 11/08/17 0728 11/12/17 0737  WBC 11.1* 9.8 15.1*  HGB 8.9* 8.9* 8.8*  HCT 26.2* 26.2* 26.2*  MCV 75.7* 74.4* 74.2*  PLT 130* 155 779   Basic Metabolic Panel: Recent Labs  Lab 11/06/17 0434 11/08/17 0728 11/10/17 1025 11/12/17 0737  NA 131* 129* 140 127*  K 4.4 5.4* 3.6 3.7  CL 90* 87* 102 83*  CO2 24 25 21* 27  GLUCOSE 114* 95 149* 124*  BUN 36* 59* 53* 57*  CREATININE 5.47* 8.28* 5.59* 6.29*  CALCIUM 8.4* 9.1 6.3* 9.5  PHOS  --  4.5 3.2 2.7   Liver Function Tests: Recent Labs  Lab 11/08/17 0728 11/10/17 1025 11/12/17 0737  ALBUMIN 3.0* 2.4* 3.1*   CBG: Recent Labs  Lab 11/05/17 2003 11/06/17 0815 11/07/17 0909 11/07/17 1659 11/09/17 0801  GLUCAP 137* 118* 104* 102* 169*    I discussed with Dr. Melvia Heaps, Nephrology and updated above events.   Time coordinating discharge: 40 minutes  SIGNED:  Vernell Leep, MD, FACP, [facility]. [facility] Pager [phone]  If 7PM-7AM, please contact night-coverage [email or web address] Password [email or web address] 11/12/2017, 11:49 AM

## 2017-11-12 NOTE — Progress Notes (Signed)
[facility] Progress Note  Subjective:  No new c/os   Vitals:   11/12/17 0930 11/12/17 1000 11/12/17 1030 11/12/17 1036  BP: 127/88 (!) 145/55 (!) 163/88 (!) 154/72  Pulse: 84 81 86 87  Resp:    18  Temp:    97.8 F (36.6 C)  TempSrc:    Oral  SpO2:    97%  Weight:    75.8 kg (167 lb 1.7 oz)  Height:        Inpatient medications: . clopidogrel  75 mg Oral Daily  . [START ON 11/18/2017] darbepoetin (ARANESP) injection - DIALYSIS  200 mcg Intravenous Q Fri-HD  . diltiazem  120 mg Oral Daily  . famotidine  20 mg Oral QHS  . feeding supplement (NEPRO CARB STEADY)  237 mL Oral BID BM  . folic acid  1 mg Oral Daily  . gabapentin  100 mg Oral BID  . heparin  5,000 Units Subcutaneous Q8H  . hydrALAZINE  100 mg Oral BID  . ipratropium-albuterol  3 mL Nebulization BID  . isosorbide mononitrate  30 mg Oral Daily  . mometasone-formoterol  2 puff Inhalation BID  . multivitamin with minerals  1 tablet Oral Daily  . nicotine  21 mg Transdermal Daily  . sevelamer carbonate  3,200 mg Oral TID WC  . sodium chloride flush  10-40 mL Intracatheter Q12H  . thiamine  100 mg Oral Daily   Or  . thiamine  100 mg Intravenous Daily   . cefTAZidime (FORTAZ)  IV 2 g (11/12/17 1005)  . fluconazole (DIFLUCAN) IV 400 mg (11/12/17 0901)   albuterol, ALPRAZolam, bisacodyl, guaiFENesin, hydrocortisone cream, HYDROmorphone (DILAUDID) injection, ondansetron **OR** ondansetron (ZOFRAN) IV, oxyCODONE  Exam:  Alert, no distress Thin No jvd Chest clear to bases bilat RRR  abd soft mod protuberant nontender mild ascites Ext no leg edema Right upper arm avf w/ bruit   Dialysis: [facility] 4h  2/2 bath  69kg  400/800   Tdc/R avf (Aug 09 2017)   heparin none -venofer 100mg  IV x10  -mircera 261mcg IV q 2 weeks (last 4/17) -parsabiv 5mg  IV TIW  -BMM Renvela 3 qac 2 q snacks -calcitriol 0.5       Impression: 1 cdif / diarrhea, improved  po vanc stopped.  2 spont bact peritonitis- sp  recent abx course. Repeat para/fluid cx 5/1 neg to date but cell count was again high at 2100 w 72% pmn's; so per ID he should be treated fortaz and diflucan for 2 wks (started 5/1), we will arrange for iv fortaz with outpt hd through 5/14 3 esrd hd [facility] --off schedule this week, hd today then resume hd on monday 4 poorly maturing R AVF - seen by [facility] and will need fistulogram when infection improves  5 cirrhosis from etoh and hep b 6 hypertension/ vol - cont hydralazine and diltiazem, weights variable 7 anemia Aranesp 200 dosed on 5/2  8 mbd of ckd ca low this am 9.1>6.3 9 cad sp stent to rca 10 severe pulm htn 11 parox afib - diltiazem 12 dispo - prob dc today  Plan - hd this am and will likely be dc'd today    [name] MD [facility] pager [phone]   11/12/2017, 11:42 AM     Recent Labs  Lab 11/08/17 0728 11/10/17 1025 11/12/17 0737  NA 129* 140 127*  K 5.4* 3.6 3.7  CL 87* 102 83*  CO2 25 21* 27  GLUCOSE 95 149* 124*  BUN 59* 53* 57*  CREATININE 8.28* 5.59* 6.29*  CALCIUM 9.1 6.3* 9.5  PHOS 4.5 3.2 2.7   Recent Labs  Lab 11/08/17 0728 11/10/17 1025 11/12/17 0737  ALBUMIN 3.0* 2.4* 3.1*   Recent Labs  Lab 11/06/17 0434 11/08/17 0728 11/12/17 0737  WBC 11.1* 9.8 15.1*  HGB 8.9* 8.9* 8.8*  HCT 26.2* 26.2* 26.2*  MCV 75.7* 74.4* 74.2*  PLT 130* 155 215   Iron/TIBC/Ferritin/ %Sat    Component Value Date/Time   IRON 104 09/03/2017 0800   TIBC 343 09/03/2017 0800   FERRITIN 513 (H) 09/03/2017 0800   IRONPCTSAT 30 09/03/2017 0800

## 2017-11-12 NOTE — Procedures (Signed)
   I was present at this dialysis session, have reviewed the session itself and made  appropriate changes [name] MD [place] pager [phone]   11/12/2017, 11:47 AM

## 2017-11-12 NOTE — Progress Notes (Addendum)
Date: 11/12/2017 Patient: [name] Admitted: 11/04/2017 12:33 AM Attending Provider: Modena Jansky, MD  [name] has made the decision to leave department 5W against the advice of Hongalgi, Lenis Dickinson, MD.  He has been informed and understands the inherent risks, including death.  He has decided to accept the responsibility for this decision. [name] and all necessary parties have been advised that he may return for further evaluation or treatment. He had just returned from Hemodialysis. Dr. Algis Liming was informed and was going to start on his discharge papers. When I notified patient that discharge papers were in process of being completed he became agitated and impatient. He left floor with belongings in hand in the direction of the elevator. [name] had current vital signs as follows:  Blood pressure (!) 154/72, pulse 87, temperature 97.8 F (36.6 C), temperature source Oral, resp. rate 18, height 5\' 9"  (1.753 m), weight 75.8 kg (167 lb 1.7 oz), SpO2 97 %.   [name]  Refused to sign the Leaving Against Medical Advice form prior to leaving the department.  Eliezer Champagne 11/12/2017

## 2017-11-12 NOTE — Plan of Care (Signed)
  Problem: Health Behavior/Discharge Planning: Goal: Ability to manage health-related needs will improve Note:  Encouraged patient to stop biting fingernails he has bitten a few of his nails off and they had scant bleeding.

## 2017-11-12 NOTE — ED Triage Notes (Signed)
Per EMS:  Patient presents to ED for assessment from home of abdominal pain, seen at IM today for same, left AMA.  Patient c/o abdomen feeling distended, can't get in a full breath, and diagnosed with ascites today.  IM recommendation was to continue dialysis and IV antibiotics at dialysis.  Patient c/o diarrhea, generalized weakness.

## 2017-11-13 MED ORDER — HYDROMORPHONE HCL 2 MG/ML IJ SOLN
1.0000 mg | INTRAMUSCULAR | Status: DC | PRN
  Administered 2017-11-13: 1 mg via INTRAVENOUS
  Filled 2017-11-13: qty 1

## 2017-11-13 MED ORDER — OXYCODONE HCL 5 MG PO TABS: 5.0000 mg | ORAL_TABLET | ORAL | 0 refills | Status: DC | PRN

## 2017-11-13 NOTE — ED Provider Notes (Signed)
[facility] EMERGENCY DEPARTMENT Provider Note   CSN: [id number] Arrival date & time: 11/12/17  2142     History   Chief Complaint Chief Complaint  Patient presents with  . Abdominal Pain    HPI [name] is a 57 y.o. male.  Recent admission for SBP.  He has history of ESRD and ascites.  HPI 57 year old male.  History of ascites.  History of end-stage renal disease.  Recently admitted with SBP.  Dialyzed and was given cefazolin during his dialysis runs.  He left AMA last night.  He stated "I just had to get the hell out of here".  He was not prescribed pain medication.  He presents here stating his abdomen still hurts.  No short of breath.  No fever.  Past Medical History:  Diagnosis Date  . Adenomatous colon polyp    tubular  . Anemia   . Anxiety   . Arthritis    left shoulder  . Atherosclerosis of aorta ([place])   . Cardiomegaly   . Chest pain    DATE UNKNOWN, C/O PERIODICALLY  . Cocaine abuse ([place])   . COPD exacerbation ([place]) 08/17/2016  . Coronary artery disease    stent 02/22/17  . Dialysis patient ([place])    Monday-Wednesday-Friday  . ESRD (end stage renal disease) on dialysis ([place])    "[name]; MWF" (07/04/2017)  . GERD (gastroesophageal reflux disease)    DATE UNKNOWN  . Hemorrhoids   . Hepatitis B, chronic ([employer])   . Hepatitis C   . History of kidney stones   . Hyperkalemia   . Hypertension   . Metabolic bone disease    Patient denies  . Mitral stenosis   . Myocardial infarction ([place])   . Pneumonia   . Pulmonary edema   . Renal disorder   . Renal insufficiency   . Shortness of breath dyspnea    " for the last past year with this dialysis"  . Solitary rectal ulcer syndrome   . Tubular adenoma of colon     Patient Active Problem List   Diagnosis Date Noted  . DNR (do not resuscitate)   . Palliative care by specialist   . Hyponatremia 11/04/2017  . SBP (spontaneous bacterial peritonitis) ([place]): PRESUMED. 10/30/2017    . Liver disease, chronic 10/30/2017  . SOB (shortness of breath)   . Abdominal pain 10/28/2017  . Upper airway cough syndrome with flattening on f/v loop 10/13/17 c/w vcd 10/17/2017  . Elevated diaphragm 10/13/2017  . Ileus ([place]) 09/29/2017  . QT prolongation 09/29/2017  . Malnutrition of moderate degree 09/29/2017  . Sinus congestion 09/03/2017  . Symptomatic anemia 09/02/2017  . Other cirrhosis of liver ([place]) 09/02/2017  . Left bundle branch block 09/02/2017  . Mitral stenosis 09/02/2017  . Hematochezia 07/15/2017  . Wide-complex tachycardia ([place])   . Endotracheally intubated   . ESRD (end stage renal disease) ([place]) 07/04/2017  . Acute respiratory failure with hypoxia ([place]) 06/18/2017  . CKD (chronic kidney disease) stage V requiring chronic dialysis ([place]) 06/18/2017  . History of Cocaine abuse ([place]) 06/18/2017  . Hypertension 06/18/2017  . Infection of AV graft for dialysis ([place]) 06/18/2017  . Anxiety 06/18/2017  . Anemia due to chronic kidney disease 06/18/2017  . Atrial flutter with rapid ventricular response ([place]) 06/18/2017  . Personality disorder ([place]) 06/13/2017  . Cellulitis 06/12/2017  . Adjustment disorder with mixed anxiety and depressed mood 06/10/2017  . Suicidal ideation 06/10/2017  . Arm wound, left, sequela  06/10/2017  . Dyspnea on exertion 05/29/2017  . Tachycardia 05/29/2017  . Hyperkalemia 05/22/2017  . Acute metabolic encephalopathy   . Anemia 04/23/2017  . Ascites 04/23/2017  . COPD (chronic obstructive pulmonary disease) ([place]) 04/23/2017  . Acute on chronic respiratory failure with hypoxia ([place]) 03/25/2017  . Arrhythmia 03/25/2017  . COPD GOLD 0 with flattening on inps f/v  09/27/2016  . Essential hypertension 09/27/2016  . Fluid overload 08/30/2016  . COPD exacerbation ([place]) 08/17/2016  . Hypertensive urgency 08/17/2016  . Respiratory failure ([place]) 08/17/2016  . Problem with dialysis access ([place]) 07/23/2016  . End stage renal disease ([place])  12/02/2015  . Chronic hepatitis B ([place]) 03/05/2014  . Chronic hepatitis C without hepatic coma ([place]) 03/05/2014  . Internal hemorrhoids with bleeding, swelling and itching 03/05/2014  . Thrombocytopenia ([place]) 03/05/2014  . Chest pain 02/27/2014  . Alcohol abuse 04/14/2009  . Cigarette smoker 04/14/2009  . GANGLION CYST 04/14/2009    Past Surgical History:  Procedure Laterality Date  . A/V FISTULAGRAM Left 05/26/2017   Procedure: A/V FISTULAGRAM;  Surgeon: Conrad , MD;  Location: [facility];  Service: Cardiovascular;  Laterality: Left;  . APPLICATION OF WOUND VAC Left 06/14/2017   Procedure: APPLICATION OF WOUND VAC;  Surgeon: Katha Cabal, MD;  Location: [facility];  Service: Vascular;  Laterality: Left;  . AV FISTULA PLACEMENT  2012   BELIEVED WAS PLACED IN JUNE  . AV FISTULA PLACEMENT Right 08/09/2017   Procedure: Creation Right arm ARTERIOVENOUS BRACHIOCEPOHALIC FISTULA;  Surgeon: Elam Dutch, MD;  Location: [facility];  Service: Vascular;  Laterality: Right;  . COLONOSCOPY    . CORONARY STENT INTERVENTION N/A 02/22/2017   Procedure: CORONARY STENT INTERVENTION;  Surgeon: Nigel Mormon, MD;  Location: [facility] CV [facility];  Service: Cardiovascular;  Laterality: N/A;  . FLEXIBLE SIGMOIDOSCOPY N/A 07/15/2017   Procedure: FLEXIBLE SIGMOIDOSCOPY;  Surgeon: Carol Ada, MD;  Location: [facility];  Service: Endoscopy;  Laterality: N/A;  . HEMORRHOID BANDING    . I&D EXTREMITY Left 06/01/2017   Procedure: IRRIGATION AND DEBRIDEMENT LEFT ARM HEMATOMA WITH LIGATION OF LEFT ARM AV FISTULA;  Surgeon: Elam Dutch, MD;  Location: [facility];  Service: Vascular;  Laterality: Left;  . I&D EXTREMITY Left 06/14/2017   Procedure: IRRIGATION AND DEBRIDEMENT EXTREMITY;  Surgeon: Katha Cabal, MD;  Location: [facility];  Service: Vascular;  Laterality: Left;  . INSERTION OF DIALYSIS CATHETER  05/30/2017  . INSERTION OF DIALYSIS CATHETER N/A 05/30/2017   Procedure: INSERTION  OF DIALYSIS CATHETER;  Surgeon: Elam Dutch, MD;  Location: [facility];  Service: Vascular;  Laterality: N/A;  . IR PARACENTESIS  08/30/2017  . IR PARACENTESIS  09/29/2017  . IR PARACENTESIS  10/28/2017  . IR PARACENTESIS  11/09/2017  . LEFT HEART CATH AND CORONARY ANGIOGRAPHY N/A 02/22/2017   Procedure: LEFT HEART CATH AND CORONARY ANGIOGRAPHY;  Surgeon: Nigel Mormon, MD;  Location: [facility];  Service: Cardiovascular;  Laterality: N/A;  . LIGATION OF ARTERIOVENOUS  FISTULA Left 07/16/4006   Procedure: Plication of Left Arm Arteriovenous Fistula;  Surgeon: Elam Dutch, MD;  Location: [facility];  Service: Vascular;  Laterality: Left;  . POLYPECTOMY    . REVISON OF ARTERIOVENOUS FISTULA Left 6/76/1950   Procedure: PLICATION OF DISTAL ANEURYSMAL SEGEMENT OF LEFT UPPER ARM ARTERIOVENOUS FISTULA;  Surgeon: Elam Dutch, MD;  Location: [facility];  Service: Vascular;  Laterality: Left;  . REVISON OF ARTERIOVENOUS FISTULA Left 07/24/2016  Procedure: Plication of Left Upper Arm Fistula ;  Surgeon: Waynetta Sandy, MD;  Location: [facility];  Service: Vascular;  Laterality: Left;  . SKIN GRAFT SPLIT THICKNESS LEG / FOOT Left    SKIN GRAFT SPLIT THICKNESS LEFT ARM DONOR SITE: LEFT ANTERIOR THIGH  . SKIN SPLIT GRAFT Left 07/04/2017   Procedure: SKIN GRAFT SPLIT THICKNESS LEFT ARM DONOR SITE: LEFT ANTERIOR THIGH;  Surgeon: Elam Dutch, MD;  Location: [facility];  Service: Vascular;  Laterality: Left;  . THROMBECTOMY W/ EMBOLECTOMY Left 06/05/2017   Procedure: EXPLORATION OF LEFT ARM FOR BLEEDING; OVERSEWED PROXIMAL FISTULA;  Surgeon: Angelia Mould, MD;  Location: [facility];  Service: Vascular;  Laterality: Left;  . WOUND EXPLORATION Left 06/03/2017   Procedure: WOUND EXPLORATION WITH WOUND VAC APPLICATION TO LEFT ARM;  Surgeon: Angelia Mould, MD;  Location: [facility];  Service: Vascular;  Laterality: Left;        Home Medications    Prior to Admission medications     Medication Sig Start Date End Date Taking? Authorizing Provider  albuterol (VENTOLIN HFA) 108 (90 Base) MCG/ACT inhaler Inhale 2 puffs into the lungs every 6 (six) hours as needed for wheezing or shortness of breath.   Yes [provider]  ALPRAZolam (XANAX) 0.25 MG tablet Take 1 tablet (0.25 mg total) by mouth 3 (three) times daily as needed for anxiety or sleep. 10/30/17  Yes Eugenie Filler, MD  budesonide-formoterol [facility]) 80-4.5 MCG/ACT inhaler Inhale 2 puffs into the lungs 2 (two) times daily. 10/13/17  Yes Tanda Rockers, MD  clopidogrel (PLAVIX) 75 MG tablet Take 1 tablet (75 mg total) by mouth daily. 07/15/17  Yes Rhyne, Hulen Shouts, PA-C  diltiazem (CARDIZEM CD) 120 MG 24 hr capsule Take 1 capsule (120 mg total) by mouth daily. 06/23/17 06/23/18 Yes Shahmehdi, Valeria Batman, MD  famotidine (PEPCID) 20 MG tablet One at bedtime Patient taking differently: Take 20 mg by mouth at bedtime.  09/08/17  Yes Tanda Rockers, MD  fluticasone (FLONASE) 50 MCG/ACT nasal spray Place 2 sprays into both nostrils daily. 10/31/17  Yes Eugenie Filler, MD  gabapentin (NEURONTIN) 100 MG capsule Take 1 capsule (100 mg total) by mouth at bedtime. Patient taking differently: Take 100 mg by mouth 2 (two) times daily.  09/30/17  Yes Mariel Aloe, MD  hydrALAZINE (APRESOLINE) 100 MG tablet Take 1 tablet by mouth twice a day take after HD on dialysis days 08/31/17  Yes [provider]  isosorbide mononitrate (IMDUR) 30 MG 24 hr tablet Take 1 tablet (30 mg total) by mouth daily. 06/17/17  Yes [name], MD  loratadine (CLARITIN) 10 MG tablet Take 1 tablet (10 mg total) by mouth daily. 10/31/17  Yes Eugenie Filler, MD  nicotine (NICODERM CQ - DOSED IN MG/24 HOURS) 21 mg/24hr patch Place 21 mg onto the skin daily.   Yes [provider]  Nutritional Supplements (FEEDING SUPPLEMENT, NEPRO CARB STEADY,) LIQD Take 237 mLs by mouth 2 (two) times daily between meals. 09/30/17  Yes Mariel Aloe, MD  sevelamer carbonate (RENVELA) 800 MG tablet Take 1,600-3,200 mg by mouth See admin instructions. Take 4 tablets (800mg  each) three times daily and 2 tablets twice daily with a snack. 10/31/17  Yes [provider]  tiotropium (SPIRIVA) 18 MCG inhalation capsule Place 18 mcg into inhaler and inhale daily.   Yes [provider]  cefTAZidime (FORTAZ) 1 g SOLR injection Inject 1 g into the vein every Monday, Wednesday,  and Friday. In HD x 1 week. Patient not taking: Reported on 11/13/2017 10/31/17   Eugenie Filler, MD  guaiFENesin (ROBITUSSIN) 100 MG/5ML SOLN Take 5 mLs (100 mg total) by mouth every 4 (four) hours as needed for cough or to loosen phlegm. Patient not taking: Reported on 11/13/2017 10/30/17   Eugenie Filler, MD  oxyCODONE (ROXICODONE) 5 MG immediate release tablet Take 1 tablet (5 mg total) by mouth every 4 (four) hours as needed for severe pain. 11/13/17   Tanna Furry, MD  pantoprazole (PROTONIX) 40 MG tablet Take 1 tablet (40 mg total) by mouth daily. Take 30-60 min before first meal of the day Patient not taking: Reported on 11/06/2017 09/08/17   Tanda Rockers, MD    Family History Family History  Problem Relation Age of Onset  . Heart disease Mother   . Lung cancer Mother   . Heart disease Father   . Malignant hyperthermia Father   . COPD Father   . Throat cancer Sister   . Esophageal cancer Sister   . Hypertension Other   . COPD Other   . Colon cancer Neg Hx   . Colon polyps Neg Hx   . Rectal cancer Neg Hx   . Stomach cancer Neg Hx     Social History Social History   Tobacco Use  . Smoking status: Current Every Day Smoker    Packs/day: 0.50    Years: 43.00    Pack years: 21.50    Types: Cigarettes    Start date: 08/13/1973  . Smokeless tobacco: Never Used  Substance Use Topics  . Alcohol use: Not Currently    Frequency: Never    Comment: quit drinking in 2017  . Drug use: Not Currently    Comment: quit in 2017"     Allergies    Aspirin; Tylenol [acetaminophen]; Clonidine derivatives; and Tramadol   Review of Systems Review of Systems  Constitutional: Negative for appetite change, chills, diaphoresis, fatigue and fever.  HENT: Negative for mouth sores, sore throat and trouble swallowing.   Eyes: Negative for visual disturbance.  Respiratory: Negative for cough, chest tightness, shortness of breath and wheezing.   Cardiovascular: Negative for chest pain.  Gastrointestinal: Positive for abdominal pain. Negative for abdominal distention, diarrhea, nausea and vomiting.  Endocrine: Negative for polydipsia, polyphagia and polyuria.  Musculoskeletal: Negative for gait problem.  Skin: Negative for color change, pallor and rash.  Neurological: Negative for dizziness, syncope, light-headedness and headaches.  Hematological: Does not bruise/bleed easily.  Psychiatric/Behavioral: Negative for behavioral problems and confusion.     Physical Exam Updated Vital Signs BP 118/81   Pulse 80   Temp 98.7 F (37.1 C) (Oral)   Resp 16   SpO2 97%   Physical Exam  Constitutional: He is oriented to person, place, and time. He appears well-developed and well-nourished. No distress.  HENT:  Head: Normocephalic.  Eyes: Pupils are equal, round, and reactive to light. Conjunctivae are normal. No scleral icterus.  Neck: Normal range of motion. Neck supple. No thyromegaly present.  Cardiovascular: Normal rate and regular rhythm. Exam reveals no gallop and no friction rub.  No murmur heard. Pulmonary/Chest: Effort normal and breath sounds normal. No respiratory distress. He has no wheezes. He has no rales.  Abdominal: Soft. Bowel sounds are normal. He exhibits no distension. There is no tenderness. There is no rebound.  Mild abdominal tenderness without peritoneal irritation.  Not distended.  Normal active bowel sounds.  Clear bilateral breath sounds.  No  respiratory distress or hypoxemia  Musculoskeletal: Normal range of motion.   Neurological: He is alert and oriented to person, place, and time.  Skin: Skin is warm and dry. No rash noted.  Psychiatric: He has a normal mood and affect. His behavior is normal.     ED Treatments / Results  Labs (all labs ordered are listed, but only abnormal results are displayed) Labs Reviewed  COMPREHENSIVE METABOLIC PANEL - Abnormal; Notable for the following components:      Result Value   Sodium 130 (*)    Chloride 93 (*)    Glucose, Bld 137 (*)    BUN 32 (*)    Creatinine, Ser 4.53 (*)    Albumin 3.0 (*)    Alkaline Phosphatase 215 (*)    GFR calc non Af Amer 13 (*)    GFR calc Af Amer 15 (*)    All other components within normal limits  CBC - Abnormal; Notable for the following components:   WBC 13.0 (*)    RBC 3.50 (*)    Hemoglobin 8.8 (*)    HCT 26.0 (*)    MCV 74.3 (*)    MCH 25.1 (*)    RDW 21.7 (*)    All other components within normal limits  LIPASE, BLOOD    EKG None  Radiology No results found.  Procedures Procedures (including critical care time)  Medications Ordered in ED Medications - No data to display   Initial Impression / Assessment and Plan / ED Course  I have reviewed the triage vital signs and the nursing notes.  Pertinent labs & imaging results that were available during my care of the patient were reviewed by me and considered in my medical decision making (see chart for details).   Abs at baseline.  Given IV pain medication.  Discussed that this course of treatment would include simply continuing his dialysis and his antibiotic demonstration during dialysis.  He was paracentesis yesterday.  At bedside I do not see large pockets of fluid that would be amenable to therapeutic paracentesis.  Plan discharge home.  Limited amount of pain medication.  Per primary care/nephrology follow-up.   Final Clinical Impressions(s) / ED Diagnoses   Final diagnoses:  SBP (spontaneous bacterial peritonitis) ([place])  Nausea    ED Discharge  Orders        Ordered    oxyCODONE (ROXICODONE) 5 MG immediate release tablet  Every 4 hours PRN     11/13/17 0332       Tanna Furry, MD 11/13/17 [phone]

## 2017-11-13 NOTE — Discharge Instructions (Addendum)
Continue your regular dialysis schedule to complete your antibiotics

## 2017-11-14 ENCOUNTER — Other Ambulatory Visit: Payer: Self-pay | Admitting: *Deleted

## 2017-11-14 DIAGNOSIS — D631 Anemia in chronic kidney disease: Secondary | ICD-10-CM | POA: Diagnosis not present

## 2017-11-14 DIAGNOSIS — D509 Iron deficiency anemia, unspecified: Secondary | ICD-10-CM | POA: Diagnosis not present

## 2017-11-14 DIAGNOSIS — A499 Bacterial infection, unspecified: Secondary | ICD-10-CM | POA: Diagnosis not present

## 2017-11-14 DIAGNOSIS — N186 End stage renal disease: Secondary | ICD-10-CM | POA: Diagnosis not present

## 2017-11-14 DIAGNOSIS — N2581 Secondary hyperparathyroidism of renal origin: Secondary | ICD-10-CM | POA: Diagnosis not present

## 2017-11-14 DIAGNOSIS — K652 Spontaneous bacterial peritonitis: Secondary | ICD-10-CM | POA: Diagnosis not present

## 2017-11-14 LAB — CULTURE, BODY FLUID W GRAM STAIN -BOTTLE: Culture: NO GROWTH

## 2017-11-14 NOTE — Telephone Encounter (Signed)
Called patient unable to reach left message to give us a call back.

## 2017-11-15 ENCOUNTER — Encounter (HOSPITAL_COMMUNITY): Payer: Self-pay

## 2017-11-15 ENCOUNTER — Other Ambulatory Visit: Payer: Self-pay

## 2017-11-15 ENCOUNTER — Emergency Department (HOSPITAL_COMMUNITY): Payer: Medicare Other

## 2017-11-15 ENCOUNTER — Inpatient Hospital Stay (HOSPITAL_COMMUNITY)
Admission: EM | Admit: 2017-11-15 | Discharge: 2017-11-19 | DRG: 871 | Disposition: A | Discharge status: Home / Self Care | Payer: Medicare Other | Attending: Internal Medicine | Admitting: Internal Medicine

## 2017-11-15 ENCOUNTER — Ambulatory Visit: Payer: Self-pay | Admitting: Internal Medicine

## 2017-11-15 DIAGNOSIS — B181 Chronic viral hepatitis B without delta-agent: Secondary | ICD-10-CM | POA: Diagnosis present

## 2017-11-15 DIAGNOSIS — K7011 Alcoholic hepatitis with ascites: Secondary | ICD-10-CM | POA: Diagnosis not present

## 2017-11-15 DIAGNOSIS — Z808 Family history of malignant neoplasm of other organs or systems: Secondary | ICD-10-CM

## 2017-11-15 DIAGNOSIS — I700 Atherosclerosis of aorta: Secondary | ICD-10-CM | POA: Diagnosis present

## 2017-11-15 DIAGNOSIS — R188 Other ascites: Secondary | ICD-10-CM

## 2017-11-15 DIAGNOSIS — K7031 Alcoholic cirrhosis of liver with ascites: Secondary | ICD-10-CM

## 2017-11-15 DIAGNOSIS — I10 Essential (primary) hypertension: Secondary | ICD-10-CM | POA: Diagnosis not present

## 2017-11-15 DIAGNOSIS — F17210 Nicotine dependence, cigarettes, uncomplicated: Secondary | ICD-10-CM | POA: Diagnosis not present

## 2017-11-15 DIAGNOSIS — I252 Old myocardial infarction: Secondary | ICD-10-CM

## 2017-11-15 DIAGNOSIS — Z9115 Patient's noncompliance with renal dialysis: Secondary | ICD-10-CM | POA: Diagnosis not present

## 2017-11-15 DIAGNOSIS — Z79899 Other long term (current) drug therapy: Secondary | ICD-10-CM

## 2017-11-15 DIAGNOSIS — T82898A Other specified complication of vascular prosthetic devices, implants and grafts, initial encounter: Secondary | ICD-10-CM | POA: Diagnosis not present

## 2017-11-15 DIAGNOSIS — K7460 Unspecified cirrhosis of liver: Secondary | ICD-10-CM | POA: Diagnosis present

## 2017-11-15 DIAGNOSIS — N2581 Secondary hyperparathyroidism of renal origin: Secondary | ICD-10-CM | POA: Diagnosis present

## 2017-11-15 DIAGNOSIS — K652 Spontaneous bacterial peritonitis: Secondary | ICD-10-CM | POA: Diagnosis not present

## 2017-11-15 DIAGNOSIS — B182 Chronic viral hepatitis C: Secondary | ICD-10-CM | POA: Diagnosis present

## 2017-11-15 DIAGNOSIS — R05 Cough: Secondary | ICD-10-CM | POA: Diagnosis not present

## 2017-11-15 DIAGNOSIS — R109 Unspecified abdominal pain: Secondary | ICD-10-CM | POA: Diagnosis present

## 2017-11-15 DIAGNOSIS — I259 Chronic ischemic heart disease, unspecified: Secondary | ICD-10-CM | POA: Diagnosis present

## 2017-11-15 DIAGNOSIS — M898X9 Other specified disorders of bone, unspecified site: Secondary | ICD-10-CM | POA: Diagnosis present

## 2017-11-15 DIAGNOSIS — A419 Sepsis, unspecified organism: Secondary | ICD-10-CM | POA: Diagnosis present

## 2017-11-15 DIAGNOSIS — Z992 Dependence on renal dialysis: Secondary | ICD-10-CM | POA: Diagnosis not present

## 2017-11-15 DIAGNOSIS — K7210 Chronic hepatic failure without coma: Secondary | ICD-10-CM | POA: Diagnosis present

## 2017-11-15 DIAGNOSIS — I480 Paroxysmal atrial fibrillation: Secondary | ICD-10-CM | POA: Diagnosis present

## 2017-11-15 DIAGNOSIS — I132 Hypertensive heart and chronic kidney disease with heart failure and with stage 5 chronic kidney disease, or end stage renal disease: Secondary | ICD-10-CM | POA: Diagnosis present

## 2017-11-15 DIAGNOSIS — J42 Unspecified chronic bronchitis: Secondary | ICD-10-CM

## 2017-11-15 DIAGNOSIS — F1011 Alcohol abuse, in remission: Secondary | ICD-10-CM | POA: Diagnosis present

## 2017-11-15 DIAGNOSIS — K219 Gastro-esophageal reflux disease without esophagitis: Secondary | ICD-10-CM

## 2017-11-15 DIAGNOSIS — I2510 Atherosclerotic heart disease of native coronary artery without angina pectoris: Secondary | ICD-10-CM | POA: Diagnosis present

## 2017-11-15 DIAGNOSIS — I5032 Chronic diastolic (congestive) heart failure: Secondary | ICD-10-CM | POA: Diagnosis present

## 2017-11-15 DIAGNOSIS — Z8249 Family history of ischemic heart disease and other diseases of the circulatory system: Secondary | ICD-10-CM

## 2017-11-15 DIAGNOSIS — N189 Chronic kidney disease, unspecified: Secondary | ICD-10-CM | POA: Diagnosis present

## 2017-11-15 DIAGNOSIS — J449 Chronic obstructive pulmonary disease, unspecified: Secondary | ICD-10-CM | POA: Diagnosis present

## 2017-11-15 DIAGNOSIS — F1410 Cocaine abuse, uncomplicated: Secondary | ICD-10-CM | POA: Diagnosis not present

## 2017-11-15 DIAGNOSIS — K7290 Hepatic failure, unspecified without coma: Secondary | ICD-10-CM | POA: Diagnosis present

## 2017-11-15 DIAGNOSIS — F419 Anxiety disorder, unspecified: Secondary | ICD-10-CM | POA: Diagnosis present

## 2017-11-15 DIAGNOSIS — Z825 Family history of asthma and other chronic lower respiratory diseases: Secondary | ICD-10-CM

## 2017-11-15 DIAGNOSIS — R000 Tachycardia, unspecified: Secondary | ICD-10-CM | POA: Diagnosis not present

## 2017-11-15 DIAGNOSIS — N186 End stage renal disease: Secondary | ICD-10-CM | POA: Diagnosis not present

## 2017-11-15 DIAGNOSIS — Z801 Family history of malignant neoplasm of trachea, bronchus and lung: Secondary | ICD-10-CM

## 2017-11-15 DIAGNOSIS — I4581 Long QT syndrome: Secondary | ICD-10-CM | POA: Diagnosis present

## 2017-11-15 DIAGNOSIS — F1010 Alcohol abuse, uncomplicated: Secondary | ICD-10-CM | POA: Diagnosis not present

## 2017-11-15 DIAGNOSIS — D631 Anemia in chronic kidney disease: Secondary | ICD-10-CM

## 2017-11-15 DIAGNOSIS — Z7951 Long term (current) use of inhaled steroids: Secondary | ICD-10-CM

## 2017-11-15 DIAGNOSIS — Z800 Family history of malignant neoplasm of digestive organs: Secondary | ICD-10-CM | POA: Diagnosis not present

## 2017-11-15 DIAGNOSIS — I447 Left bundle-branch block, unspecified: Secondary | ICD-10-CM | POA: Diagnosis present

## 2017-11-15 DIAGNOSIS — Z7902 Long term (current) use of antithrombotics/antiplatelets: Secondary | ICD-10-CM

## 2017-11-15 DIAGNOSIS — R1084 Generalized abdominal pain: Secondary | ICD-10-CM | POA: Diagnosis not present

## 2017-11-15 DIAGNOSIS — I120 Hypertensive chronic kidney disease with stage 5 chronic kidney disease or end stage renal disease: Secondary | ICD-10-CM | POA: Diagnosis not present

## 2017-11-15 DIAGNOSIS — Z955 Presence of coronary angioplasty implant and graft: Secondary | ICD-10-CM

## 2017-11-15 LAB — CBC
HCT: 29 % — ABNORMAL LOW (ref 39.0–52.0)
Hemoglobin: 9.7 g/dL — ABNORMAL LOW (ref 13.0–17.0)
MCH: 25.1 pg — ABNORMAL LOW (ref 26.0–34.0)
MCHC: 33.4 g/dL (ref 30.0–36.0)
MCV: 74.9 fL — ABNORMAL LOW (ref 78.0–100.0)
Platelets: 226 10*3/uL (ref 150–400)
RBC: 3.87 MIL/uL — ABNORMAL LOW (ref 4.22–5.81)
RDW: 21.8 % — ABNORMAL HIGH (ref 11.5–15.5)
WBC: 11.4 10*3/uL — ABNORMAL HIGH (ref 4.0–10.5)

## 2017-11-15 LAB — COMPREHENSIVE METABOLIC PANEL
ALT: 20 U/L (ref 17–63)
AST: 26 U/L (ref 15–41)
Albumin: 2.9 g/dL — ABNORMAL LOW (ref 3.5–5.0)
Alkaline Phosphatase: 190 U/L — ABNORMAL HIGH (ref 38–126)
Anion gap: 14 (ref 5–15)
BUN: 37 mg/dL — ABNORMAL HIGH (ref 6–20)
CO2: 26 mmol/L (ref 22–32)
Calcium: 8.5 mg/dL — ABNORMAL LOW (ref 8.9–10.3)
Chloride: 93 mmol/L — ABNORMAL LOW (ref 101–111)
Creatinine, Ser: 5.15 mg/dL — ABNORMAL HIGH (ref 0.61–1.24)
GFR calc Af Amer: 13 mL/min — ABNORMAL LOW (ref >60)
GFR calc non Af Amer: 11 mL/min — ABNORMAL LOW (ref >60)
Glucose, Bld: 131 mg/dL — ABNORMAL HIGH (ref 65–99)
Potassium: 4.6 mmol/L (ref 3.5–5.1)
Sodium: 133 mmol/L — ABNORMAL LOW (ref 135–145)
Total Bilirubin: 1.1 mg/dL (ref 0.3–1.2)
Total Protein: 6.8 g/dL (ref 6.5–8.1)

## 2017-11-15 LAB — PROTIME-INR
INR: 1.21
Prothrombin Time: 15.2 seconds (ref 11.4–15.2)

## 2017-11-15 LAB — I-STAT CG4 LACTIC ACID, ED: Lactic Acid, Venous: 0.98 mmol/L (ref 0.5–1.9)

## 2017-11-15 LAB — LACTIC ACID, PLASMA: Lactic Acid, Venous: 1.3 mmol/L (ref 0.5–1.9)

## 2017-11-15 LAB — APTT: aPTT: 33 seconds (ref 24–36)

## 2017-11-15 LAB — AMMONIA: Ammonia: 40 umol/L — ABNORMAL HIGH (ref 9–35)

## 2017-11-15 LAB — LIPASE, BLOOD: Lipase: 24 U/L (ref 11–51)

## 2017-11-15 LAB — PROCALCITONIN: Procalcitonin: 0.93 ng/mL

## 2017-11-15 MED ORDER — HYDROMORPHONE HCL 2 MG/ML IJ SOLN
1.0000 mg | Freq: Once | INTRAMUSCULAR | Status: AC
  Administered 2017-11-15: 1 mg via INTRAVENOUS
  Filled 2017-11-15: qty 1

## 2017-11-15 MED ORDER — HYDRALAZINE HCL 20 MG/ML IJ SOLN
5.0000 mg | INTRAMUSCULAR | Status: DC | PRN
  Administered 2017-11-16: 5 mg via INTRAVENOUS
  Filled 2017-11-15: qty 1

## 2017-11-15 MED ORDER — ZOLPIDEM TARTRATE 5 MG PO TABS
5.0000 mg | ORAL_TABLET | Freq: Every evening | ORAL | Status: DC | PRN
  Administered 2017-11-15 – 2017-11-17 (×3): 5 mg via ORAL
  Filled 2017-11-15 (×3): qty 1

## 2017-11-15 MED ORDER — DILTIAZEM HCL ER COATED BEADS 120 MG PO CP24
120.0000 mg | ORAL_CAPSULE | Freq: Every day | ORAL | Status: DC
  Administered 2017-11-15 – 2017-11-19 (×5): 120 mg via ORAL
  Filled 2017-11-15 (×6): qty 1

## 2017-11-15 MED ORDER — ALBUMIN HUMAN 25 % IV SOLN
100.0000 g | Freq: Once | INTRAVENOUS | Status: AC
  Administered 2017-11-16: 12.5 g via INTRAVENOUS
  Filled 2017-11-15: qty 400

## 2017-11-15 MED ORDER — ALPRAZOLAM 0.5 MG PO TABS: 0.2500 mg | ORAL_TABLET | Freq: Three times a day (TID) | ORAL | Status: DC | PRN

## 2017-11-15 MED ORDER — NEPRO/CARBSTEADY PO LIQD
237.0000 mL | Freq: Two times a day (BID) | ORAL | Status: DC
  Administered 2017-11-18 – 2017-11-19 (×3): 237 mL via ORAL
  Filled 2017-11-15 (×9): qty 237

## 2017-11-15 MED ORDER — HEPARIN SODIUM (PORCINE) 5000 UNIT/ML IJ SOLN
5000.0000 [IU] | Freq: Three times a day (TID) | INTRAMUSCULAR | Status: DC
  Filled 2017-11-15: qty 1

## 2017-11-15 MED ORDER — ALBUTEROL SULFATE (2.5 MG/3ML) 0.083% IN NEBU
2.5000 mg | INHALATION_SOLUTION | RESPIRATORY_TRACT | Status: DC | PRN
Start: 2017-11-15 — End: 2017-11-19

## 2017-11-15 MED ORDER — SODIUM CHLORIDE 0.9 % IV SOLN
2.0000 g | INTRAVENOUS | Status: DC
  Administered 2017-11-16 – 2017-11-19 (×2): 2 g via INTRAVENOUS
  Filled 2017-11-15 (×3): qty 2

## 2017-11-15 MED ORDER — SEVELAMER CARBONATE 800 MG PO TABS: 1600.0000 mg | ORAL_TABLET | ORAL | Status: DC

## 2017-11-15 MED ORDER — FLUTICASONE PROPIONATE 50 MCG/ACT NA SUSP
2.0000 | Freq: Every day | NASAL | Status: DC
  Administered 2017-11-16: 2 via NASAL
  Filled 2017-11-15: qty 16

## 2017-11-15 MED ORDER — ISOSORBIDE MONONITRATE ER 30 MG PO TB24
30.0000 mg | ORAL_TABLET | Freq: Every day | ORAL | Status: DC
  Administered 2017-11-15 – 2017-11-19 (×5): 30 mg via ORAL
  Filled 2017-11-15 (×5): qty 1

## 2017-11-15 MED ORDER — MOMETASONE FURO-FORMOTEROL FUM 100-5 MCG/ACT IN AERO
2.0000 | INHALATION_SPRAY | Freq: Two times a day (BID) | RESPIRATORY_TRACT | Status: DC
  Administered 2017-11-16 – 2017-11-19 (×5): 2 via RESPIRATORY_TRACT
  Filled 2017-11-15: qty 8.8

## 2017-11-15 MED ORDER — GUAIFENESIN 100 MG/5ML PO SOLN
5.0000 mL | ORAL | Status: DC | PRN
  Filled 2017-11-15 (×2): qty 5

## 2017-11-15 MED ORDER — HYDROXYZINE HCL 10 MG PO TABS
10.0000 mg | ORAL_TABLET | Freq: Three times a day (TID) | ORAL | Status: DC | PRN
  Administered 2017-11-15: 10 mg via ORAL
  Filled 2017-11-15: qty 1

## 2017-11-15 MED ORDER — HYDRALAZINE HCL 10 MG PO TABS
10.0000 mg | ORAL_TABLET | Freq: Two times a day (BID) | ORAL | Status: DC
  Administered 2017-11-15 – 2017-11-19 (×8): 10 mg via ORAL
  Filled 2017-11-15 (×10): qty 1

## 2017-11-15 MED ORDER — FLUCONAZOLE 100 MG PO TABS
200.0000 mg | ORAL_TABLET | Freq: Every day | ORAL | Status: DC
  Administered 2017-11-15 – 2017-11-18 (×4): 200 mg via ORAL
  Filled 2017-11-15 (×4): qty 2

## 2017-11-15 MED ORDER — GABAPENTIN 100 MG PO CAPS
100.0000 mg | ORAL_CAPSULE | Freq: Two times a day (BID) | ORAL | Status: DC
  Administered 2017-11-15 – 2017-11-19 (×8): 100 mg via ORAL
  Filled 2017-11-15 (×8): qty 1

## 2017-11-15 MED ORDER — LORATADINE 10 MG PO TABS
10.0000 mg | ORAL_TABLET | Freq: Every day | ORAL | Status: DC
  Administered 2017-11-16: 10 mg via ORAL
  Filled 2017-11-15 (×4): qty 1

## 2017-11-15 MED ORDER — SODIUM CHLORIDE 0.9 % IV SOLN
1.0000 g | Freq: Once | INTRAVENOUS | Status: AC
  Administered 2017-11-15: 1 g via INTRAVENOUS
  Filled 2017-11-15: qty 1

## 2017-11-15 MED ORDER — PANTOPRAZOLE SODIUM 40 MG PO TBEC
40.0000 mg | DELAYED_RELEASE_TABLET | Freq: Every day | ORAL | Status: DC
  Administered 2017-11-15 – 2017-11-19 (×5): 40 mg via ORAL
  Filled 2017-11-15 (×5): qty 1

## 2017-11-15 MED ORDER — OXYCODONE HCL 5 MG PO TABS
5.0000 mg | ORAL_TABLET | ORAL | Status: DC | PRN
  Administered 2017-11-16 – 2017-11-17 (×5): 5 mg via ORAL
  Filled 2017-11-15 (×6): qty 1

## 2017-11-15 MED ORDER — LACTULOSE 10 GM/15ML PO SOLN
20.0000 g | Freq: Two times a day (BID) | ORAL | Status: DC
  Administered 2017-11-16 – 2017-11-18 (×4): 20 g via ORAL
  Filled 2017-11-15 (×10): qty 30

## 2017-11-15 MED ORDER — NICOTINE 21 MG/24HR TD PT24
21.0000 mg | MEDICATED_PATCH | Freq: Every day | TRANSDERMAL | Status: DC
  Filled 2017-11-15 (×4): qty 1

## 2017-11-15 MED ORDER — TIOTROPIUM BROMIDE MONOHYDRATE 18 MCG IN CAPS
18.0000 ug | ORAL_CAPSULE | Freq: Every day | RESPIRATORY_TRACT | Status: DC
  Filled 2017-11-15: qty 5

## 2017-11-15 MED ORDER — CLOPIDOGREL BISULFATE 75 MG PO TABS
75.0000 mg | ORAL_TABLET | Freq: Every day | ORAL | Status: DC
  Administered 2017-11-15 – 2017-11-18 (×4): 75 mg via ORAL
  Filled 2017-11-15 (×5): qty 1

## 2017-11-15 NOTE — Telephone Encounter (Signed)
Attempted to call the pt. I did not receive an answer. I have left a message for pt to return our call.  

## 2017-11-15 NOTE — ED Notes (Signed)
Patient transported to X-ray 

## 2017-11-15 NOTE — ED Triage Notes (Signed)
Pt reports abdominal pain that started a couple of days ago. Seen here on 5/4 for the same. He is a MWF dialysis patient, went yesterday. States they have not been taking as much fluid off. Pt states the pain radiates to his back. Nausea without vomiting reported, also reports decreased appetite.

## 2017-11-15 NOTE — Progress Notes (Signed)
Pharmacy Antibiotic Note  [name] is a 57 y.o. male admitted on 11/15/2017 with SBP.  Pharmacy has been consulted for ceftazidime/fluconazole dosing - ID recommended regimen per EDP. ESRD on HD MWF (last 5/6). Of note, patient was recently hospitalized for 8 days for presumed SBP. ID was consulted recommended 14 days of IV Ceftazidime, but patient left AMA. Now presenting again with persistent abdominal pain.  Plan: Ceftazidime 1g IV x 1; then 2g IV qMWF at 1800 Fluconazole 200mg  PO q24h at 1800 Monitor clinical progress, c/s, liver function tests on fluconzole, abx plan/LOT Pre-HD vancomycin level as indicated F/u HD schedule/tolerance inpatient   Height: 5\' 9"  (175.3 cm) Weight: 155 lb (70.3 kg) IBW/kg (Calculated) : 70.7  Temp (24hrs), Avg:98.3 F (36.8 C), Min:97.7 F (36.5 C), Max:98.8 F (37.1 C)  Recent Labs  Lab 11/10/17 1025 11/12/17 0737 11/12/17 2155 11/15/17 0919 11/15/17 1853  WBC  --  15.1* 13.0* 11.4*  --   CREATININE 5.59* 6.29* 4.53* 5.15*  --   LATICACIDVEN  --   --   --   --  0.98    Estimated Creatinine Clearance: 16.1 mL/min (A) (by C-G formula based on SCr of 5.15 mg/dL (H)).    Allergies  Allergen Reactions  . Aspirin Other (See Comments)    STOMACH PAIN  . Tylenol [Acetaminophen]     Stomach ache  . Clonidine Derivatives Itching  . Tramadol Itching   Elicia Lamp, PharmD, BCPS Clinical Pharmacist 11/15/2017 9:49 PM

## 2017-11-15 NOTE — H&P (Signed)
History and Physical    [name] FGH:[id number] DOB: 10/04/1960 DOA: 11/15/2017  Referring MD/NP/PA:   PCP: [name], MD   Patient coming from:  The patient is coming from home.  At baseline, pt is independent for most of ADL.  Chief Complaint: Abdominal pain  HPI: [name] is a 57 y.o. male with medical history significant of alcohol abuse, alcoholic liver cirrhosis with ascites, hypertension, COPD, GERD, anxiety, anemia, polysubstance abuse (tobacco, cocaine and marijuana), HBV, HCV, CAD, ESRD-HD (MWF), who presents with abdominal pain.  Patient was recently hospitalized from 4/26-5/4 due to abdominal pain.  Patient had paracentesis and was diagnosed as SBP.  ID was consulted, recommended to treat patient with [name] and fluconazole for 14 days.  Patient left hospital AMA.  Patient states that he received antibiotics after dialysis yesterday, not sure if patient is taking fluconazole or not.  He states that he has worsening abdominal pain, which is located in the lower and middle abdomen, constant, 10 out of 10 severity, sharp, radiating to the back.  His abdomen is also distended.  Patient has nausea, no vomiting or diarrhea.  He does not have fever or chills.  He has mild dry cough and mild shortness of breath which he attributes to abdominal distention. He denies chest pain.  Patient states that he has burning on urination recently.  He states that he stopped drinking alcohol for more than a year.  Still smokes cigarettes and uses marijuana.  ED Course: pt was found to have WBC 11.4, lactic acid 0.98, INR 1.21, lipase 24, ammonia 40, potassium 4.6, bicarbonate 26, creatinine 5.15, BUN 37, temperature 98.8, tachycardia, tachypnea, oxygen saturation 97% on room air, blood pressure elevated to 10/97, chest x-ray showed vascular congestion without infiltration.  Patient is admitted to telemetry bed as inpatient.  Review of Systems:   General: no fevers,  chills, has poor appetite, has fatigue HEENT: no blurry vision, hearing changes or sore throat Respiratory: no dyspnea, coughing, wheezing CV: no chest pain, no palpitations GI: has nausea, abdominal pain, no diarrhea, constipation, vomiting, GU: no dysuria, has burning on urination, no increased urinary frequency, hematuria  Ext: no leg edema Neuro: no unilateral weakness, numbness, or tingling, no vision change or hearing loss Skin: no rash, no skin tear. MSK: No muscle spasm, no deformity, no limitation of range of movement in spin Heme: No easy bruising.  Travel history: No recent long distant travel.  Allergy:  Allergies  Allergen Reactions  . Aspirin Other (See Comments)    STOMACH PAIN  . Tylenol [Acetaminophen]     Stomach ache  . Clonidine Derivatives Itching  . Tramadol Itching    Past Medical History:  Diagnosis Date  . Adenomatous colon polyp    tubular  . Anemia   . Anxiety   . Arthritis    left shoulder  . Atherosclerosis of aorta ([place])   . Cardiomegaly   . Chest pain    DATE UNKNOWN, C/O PERIODICALLY  . Cocaine abuse ([place])   . COPD exacerbation ([place]) 08/17/2016  . Coronary artery disease    stent 02/22/17  . Dialysis patient ([place])    Monday-Wednesday-Friday  . ESRD (end stage renal disease) on dialysis ([place])    "[name]; MWF" (07/04/2017)  . GERD (gastroesophageal reflux disease)    DATE UNKNOWN  . Hemorrhoids   . Hepatitis B, chronic ([place])   . Hepatitis C   . History of kidney stones   . Hyperkalemia   .  Hypertension   . Metabolic bone disease    Patient denies  . Mitral stenosis   . Myocardial infarction ([place])   . Pneumonia   . Pulmonary edema   . Renal disorder   . Renal insufficiency   . Shortness of breath dyspnea    " for the last past year with this dialysis"  . Solitary rectal ulcer syndrome   . Tubular adenoma of colon     Past Surgical History:  Procedure Laterality Date  . A/V FISTULAGRAM Left 05/26/2017   Procedure: A/V  FISTULAGRAM;  Surgeon: Conrad Brenda, MD;  Location: [place] [facility];  Service: Cardiovascular;  Laterality: Left;  . APPLICATION OF WOUND VAC Left 06/14/2017   Procedure: APPLICATION OF WOUND VAC;  Surgeon: Katha Cabal, MD;  Location: [facility];  Service: Vascular;  Laterality: Left;  . AV FISTULA PLACEMENT  2012   BELIEVED WAS PLACED IN JUNE  . AV FISTULA PLACEMENT Right 08/09/2017   Procedure: Creation Right arm ARTERIOVENOUS BRACHIOCEPOHALIC FISTULA;  Surgeon: Elam Dutch, MD;  Location: [place];  Service: Vascular;  Laterality: Right;  . COLONOSCOPY    . CORONARY STENT INTERVENTION N/A 02/22/2017   Procedure: CORONARY STENT INTERVENTION;  Surgeon: Nigel Mormon, MD;  Location: [place] [facility];  Service: Cardiovascular;  Laterality: N/A;  . FLEXIBLE SIGMOIDOSCOPY N/A 07/15/2017   Procedure: FLEXIBLE SIGMOIDOSCOPY;  Surgeon: Carol Ada, MD;  Location: [place];  Service: Endoscopy;  Laterality: N/A;  . HEMORRHOID BANDING    . I&D EXTREMITY Left 06/01/2017   Procedure: IRRIGATION AND DEBRIDEMENT LEFT ARM HEMATOMA WITH LIGATION OF LEFT ARM AV FISTULA;  Surgeon: Elam Dutch, MD;  Location: [place];  Service: Vascular;  Laterality: Left;  . I&D EXTREMITY Left 06/14/2017   Procedure: IRRIGATION AND DEBRIDEMENT EXTREMITY;  Surgeon: Katha Cabal, MD;  Location: [facility];  Service: Vascular;  Laterality: Left;  . INSERTION OF DIALYSIS CATHETER  05/30/2017  . INSERTION OF DIALYSIS CATHETER N/A 05/30/2017   Procedure: INSERTION OF DIALYSIS CATHETER;  Surgeon: Elam Dutch, MD;  Location: [place];  Service: Vascular;  Laterality: N/A;  . IR PARACENTESIS  08/30/2017  . IR PARACENTESIS  09/29/2017  . IR PARACENTESIS  10/28/2017  . IR PARACENTESIS  11/09/2017  . LEFT HEART CATH AND CORONARY ANGIOGRAPHY N/A 02/22/2017   Procedure: LEFT HEART CATH AND CORONARY ANGIOGRAPHY;  Surgeon: Nigel Mormon, MD;  Location: [place] [facility];  Service: Cardiovascular;   Laterality: N/A;  . LIGATION OF ARTERIOVENOUS  FISTULA Left 01/14/2830   Procedure: Plication of Left Arm Arteriovenous Fistula;  Surgeon: Elam Dutch, MD;  Location: [place];  Service: Vascular;  Laterality: Left;  . POLYPECTOMY    . REVISON OF ARTERIOVENOUS FISTULA Left 11/26/6158   Procedure: PLICATION OF DISTAL ANEURYSMAL SEGEMENT OF LEFT UPPER ARM ARTERIOVENOUS FISTULA;  Surgeon: Elam Dutch, MD;  Location: [place];  Service: Vascular;  Laterality: Left;  . REVISON OF ARTERIOVENOUS FISTULA Left 7/37/1062   Procedure: Plication of Left Upper Arm Fistula ;  Surgeon: Waynetta Sandy, MD;  Location: [place];  Service: Vascular;  Laterality: Left;  . SKIN GRAFT SPLIT THICKNESS LEG / FOOT Left    SKIN GRAFT SPLIT THICKNESS LEFT ARM DONOR SITE: LEFT ANTERIOR THIGH  . SKIN SPLIT GRAFT Left 07/04/2017   Procedure: SKIN GRAFT SPLIT THICKNESS LEFT ARM DONOR SITE: LEFT ANTERIOR THIGH;  Surgeon: Elam Dutch, MD;  Location: [place];  Service: Vascular;  Laterality: Left;  .  THROMBECTOMY W/ EMBOLECTOMY Left 06/05/2017   Procedure: EXPLORATION OF LEFT ARM FOR BLEEDING; OVERSEWED PROXIMAL FISTULA;  Surgeon: Angelia Mould, MD;  Location: [place];  Service: Vascular;  Laterality: Left;  . WOUND EXPLORATION Left 06/03/2017   Procedure: WOUND EXPLORATION WITH WOUND VAC APPLICATION TO LEFT ARM;  Surgeon: Angelia Mould, MD;  Location: [place];  Service: Vascular;  Laterality: Left;    Social History:  reports that he has been smoking cigarettes.  He started smoking about 44 years ago. He has a 21.50 pack-year smoking history. He has never used smokeless tobacco. He reports that he drank alcohol. He reports that he has current or past drug history.  Family History:  Family History  Problem Relation Age of Onset  . Heart disease Mother   . Lung cancer Mother   . Heart disease Father   . Malignant hyperthermia Father   . COPD Father   . Throat cancer Sister   . Esophageal cancer  Sister   . Hypertension Other   . COPD Other   . Colon cancer Neg Hx   . Colon polyps Neg Hx   . Rectal cancer Neg Hx   . Stomach cancer Neg Hx      Prior to Admission medications   Medication Sig Start Date End Date Taking? Authorizing Provider  albuterol (VENTOLIN HFA) 108 (90 Base) MCG/ACT inhaler Inhale 2 puffs into the lungs every 6 (six) hours as needed for wheezing or shortness of breath.    [provider]  ALPRAZolam Duanne Moron) 0.25 MG tablet Take 1 tablet (0.25 mg total) by mouth 3 (three) times daily as needed for anxiety or sleep. Patient not taking: Reported on 11/15/2017 10/30/17   Eugenie Filler, MD  budesonide-formoterol [facility]) 80-4.5 MCG/ACT inhaler Inhale 2 puffs into the lungs 2 (two) times daily. 10/13/17   Tanda Rockers, MD  cefTAZidime (FORTAZ) 1 g SOLR injection Inject 1 g into the vein every Monday, Wednesday, and Friday. In HD x 1 week. Patient not taking: Reported on 11/13/2017 10/31/17   Eugenie Filler, MD  clopidogrel (PLAVIX) 75 MG tablet Take 1 tablet (75 mg total) by mouth daily. 07/15/17   Rhyne, Hulen Shouts, PA-C  diltiazem (CARDIZEM CD) 120 MG 24 hr capsule Take 1 capsule (120 mg total) by mouth daily. 06/23/17 06/23/18  Deatra James, MD  famotidine (PEPCID) 20 MG tablet One at bedtime Patient taking differently: Take 20 mg by mouth at bedtime.  09/08/17   Tanda Rockers, MD  fluticasone (FLONASE) 50 MCG/ACT nasal spray Place 2 sprays into both nostrils daily. 10/31/17   Eugenie Filler, MD  gabapentin (NEURONTIN) 100 MG capsule Take 1 capsule (100 mg total) by mouth at bedtime. Patient taking differently: Take 100 mg by mouth 2 (two) times daily.  09/30/17   Mariel Aloe, MD  guaiFENesin (ROBITUSSIN) 100 MG/5ML SOLN Take 5 mLs (100 mg total) by mouth every 4 (four) hours as needed for cough or to loosen phlegm. Patient not taking: Reported on 11/13/2017 10/30/17   Eugenie Filler, MD  hydrALAZINE (APRESOLINE) 100 MG tablet Take 1 tablet  by mouth twice a day take after HD on dialysis days 08/31/17   [provider]  isosorbide mononitrate (IMDUR) 30 MG 24 hr tablet Take 1 tablet (30 mg total) by mouth daily. 06/17/17   [name], MD  loratadine (CLARITIN) 10 MG tablet Take 1 tablet (10 mg total) by mouth daily. 10/31/17   Eugenie Filler, MD  nicotine (NICODERM CQ - DOSED IN MG/24 HOURS) 21 mg/24hr patch Place 21 mg onto the skin daily.    [provider]  Nutritional Supplements (FEEDING SUPPLEMENT, NEPRO CARB STEADY,) LIQD Take 237 mLs by mouth 2 (two) times daily between meals. 09/30/17   Mariel Aloe, MD  oxyCODONE (ROXICODONE) 5 MG immediate release tablet Take 1 tablet (5 mg total) by mouth every 4 (four) hours as needed for severe pain. 11/13/17   Tanna Furry, MD  pantoprazole (PROTONIX) 40 MG tablet Take 1 tablet (40 mg total) by mouth daily. Take 30-60 min before first meal of the day Patient not taking: Reported on 11/06/2017 09/08/17   Tanda Rockers, MD  sevelamer carbonate (RENVELA) 800 MG tablet Take 1,600-3,200 mg by mouth See admin instructions. Take 4 tablets (800mg  each) three times daily and 2 tablets twice daily with a snack. 10/31/17   [provider]  tiotropium (SPIRIVA) 18 MCG inhalation capsule Place 18 mcg into inhaler and inhale daily.    [provider]    Physical Exam: Vitals:   11/15/17 2115 11/15/17 2320 11/16/17 0028 11/16/17 0121  BP: (!) 210/97 (!) 182/87 (!) 174/92 (!) 185/89  Pulse: (!) 110 (!) 102 (!) 102 98  Resp: (!) 22 16 18 18   Temp:      TempSrc:      SpO2: 98% 97% 99% 94%  Weight:      Height:       General: in modrate acute distress due to AP. HEENT:       Eyes: PERRL, EOMI, no scleral icterus.       ENT: No discharge from the ears and nose, no pharynx injection, no tonsillar enlargement.        Neck: No JVD, no bruit, no mass felt. Heme: No neck lymph node enlargement. Cardiac: S1/S2, RRR, No murmurs, No gallops or rubs. Respiratory:  No  rales, wheezing, rhonchi or rubs. GI: distended, diffusely tender, no rebound pain, no organomegaly, BS present. GU: No hematuria Ext: No pitting leg edema bilaterally. 2+DP/PT pulse bilaterally. Musculoskeletal: No joint deformities, No joint redness or warmth, no limitation of ROM in spin. Skin: No rashes.  Neuro: Alert, oriented X3, cranial nerves II-XII grossly intact, moves all extremities normally. Psych: Patient is not psychotic, no suicidal or hemocidal ideation.  Labs on Admission: I have personally reviewed following labs and imaging studies  CBC: Recent Labs  Lab 11/12/17 0737 11/12/17 2155 11/15/17 0919  WBC 15.1* 13.0* 11.4*  HGB 8.8* 8.8* 9.7*  HCT 26.2* 26.0* 29.0*  MCV 74.2* 74.3* 74.9*  PLT 215 198 638   Basic Metabolic Panel: Recent Labs  Lab 11/10/17 1025 11/12/17 0737 11/12/17 2155 11/15/17 0919  NA 140 127* 130* 133*  K 3.6 3.7 5.1 4.6  CL 102 83* 93* 93*  CO2 21* 27 25 26   GLUCOSE 149* 124* 137* 131*  BUN 53* 57* 32* 37*  CREATININE 5.59* 6.29* 4.53* 5.15*  CALCIUM 6.3* 9.5 9.0 8.5*  PHOS 3.2 2.7  --   --    GFR: Estimated Creatinine Clearance: 16.1 mL/min (A) (by C-G formula based on SCr of 5.15 mg/dL (H)). Liver Function Tests: Recent Labs  Lab 11/10/17 1025 11/12/17 0737 11/12/17 2155 11/15/17 0919  AST  --   --  40 26  ALT  --   --  27 20  ALKPHOS  --   --  215* 190*  BILITOT  --   --  0.8 1.1  PROT  --   --  6.7 6.8  ALBUMIN 2.4* 3.1* 3.0* 2.9*   Recent Labs  Lab 11/12/17 2155 11/15/17 0919  LIPASE 26 24   Recent Labs  Lab 11/15/17 1918  AMMONIA 40*   Coagulation Profile: Recent Labs  Lab 11/15/17 1841  INR 1.21   Cardiac Enzymes: No results for input(s): CKTOTAL, CKMB, CKMBINDEX, TROPONINI in the last 168 hours. BNP (last 3 results) No results for input(s): PROBNP in the last 8760 hours. HbA1C: No results for input(s): HGBA1C in the last 72 hours. CBG: Recent Labs  Lab 11/09/17 0801  GLUCAP 169*   Lipid  Profile: No results for input(s): CHOL, HDL, LDLCALC, TRIG, CHOLHDL, LDLDIRECT in the last 72 hours. Thyroid Function Tests: No results for input(s): TSH, T4TOTAL, FREET4, T3FREE, THYROIDAB in the last 72 hours. Anemia Panel: No results for input(s): VITAMINB12, FOLATE, FERRITIN, TIBC, IRON, RETICCTPCT in the last 72 hours. Urine analysis:    Component Value Date/Time   COLORURINE YELLOW 07/16/2011 1619   APPEARANCEUR CLEAR 07/16/2011 1619   LABSPEC 1.018 07/16/2011 1619   PHURINE 7.5 07/16/2011 1619   GLUCOSEU 250 (A) 07/16/2011 1619   HGBUR MODERATE (A) 07/16/2011 1619   BILIRUBINUR SMALL (A) 07/16/2011 1619   KETONESUR NEGATIVE 07/16/2011 1619   PROTEINUR >300 (A) 07/16/2011 1619   UROBILINOGEN 1.0 07/16/2011 1619   NITRITE NEGATIVE 07/16/2011 1619   LEUKOCYTESUR SMALL (A) 07/16/2011 1619   Sepsis Labs: @LABRCNTIP (procalcitonin:4,lacticidven:4) ) Recent Results (from the past 240 hour(s))  Gram stain     Status: None   Collection Time: 11/09/17  2:41 PM  Result Value Ref Range Status   Specimen Description PERITONEAL  Final   Special Requests NONE  Final   Gram Stain   Final    ABUNDANT WBC PRESENT,BOTH PMN AND MONONUCLEAR NO ORGANISMS SEEN Performed at [facility], [place] [address] [place], [place] [address]    Report Status 11/09/2017 FINAL  Final  Culture, body fluid-bottle     Status: None   Collection Time: 11/09/17  2:41 PM  Result Value Ref Range Status   Specimen Description PERITONEAL  Final   Special Requests NONE  Final   Culture   Final    NO GROWTH 5 DAYS Performed at [facility] [address], [place], [place] [address]    Report Status 11/14/2017 FINAL  Final     Radiological Exams on Admission: Dg Chest 2 View  Result Date: 11/15/2017 CLINICAL DATA:  Abdominal distention with productive cough. EXAM: CHEST - 2 VIEW COMPARISON:  None. FINDINGS: The heart is enlarged. Dialysis catheter tips project over the RIGHT atrium. Mild vascular  congestion without consolidation or edema. No effusion or pneumothorax. No acute osseous findings. IMPRESSION: Cardiomegaly. Mild vascular congestion. No frank edema or consolidation. Electronically Signed   By: Staci Righter M.D.   On: 11/15/2017 19:37     EKG: Independently reviewed.  Sinus rhythm, QTC 533, old left bundle blockade, LAD, poor R wave progression   Assessment/Plan Principal Problem:   SBP (spontaneous bacterial peritonitis) (HCC) Active Problems:   Alcohol abuse   Cigarette smoker   Essential hypertension   COPD (chronic obstructive pulmonary disease) (HCC)   History of Cocaine abuse ([place])   Hypertension   Anemia due to chronic kidney disease   ESRD on dialysis ([facility])   Abdominal pain   Liver cirrhosis ([facility])   GERD (gastroesophageal reflux disease)   Sepsis due to SBP (spontaneous bacterial peritonitis) ([place]): pt's abdominal pain is most likely due to SBP.  Patient admits criteria for sepsis with leukocytosis, tachycardia and tachypnea.  Lactic acid is normal.  Hemodynamically stable.  -will admit to tele bed as inpt -Abx:  Fortaz and fluconazole -Blood culture x2 -will get Procalcitonin and trend lactic acid levels per sepsis protocol. -IVF: will not give IVF due to ESRD and normal lactic acid -prn oxycodone for pain and zofran for nausea  Tobacco abuse and Alcohol abuse: pt states that he did not drink alcohol for more than 1 year.  -Did counseling about importanceofquitting substance use smoking -Nicotine patch  History of Cocaine abuse: -check UDS -Data counseling about the importance of quitting substance use.  HTN:  -Continue home medications: Cardizem, hydralazine -IV hydralazine prn  COPD (chronic obstructive pulmonary disease) ([place]): stable. -Duonebs inhaler and prn albuterol nebs  Anemia due to chronic kidney disease: hgb stable.  Hemoglobin 8.8 on 11/12/2017--> 9.7 today -f/u by CBC  ESRD (end stage renal disease) on dialysis (MWF):   potassium 4.6, bicarbonate 26, creatinine 5.15, BUN 37 -please call renal for HD in AM.  Liver cirrhosis ([place]): INR 1.21.  Mental status normal, but ammonia level elevated at 40 -Start lactulose 20 mg twice daily  GERD: -Protonix -hold Pepcid due to QTc 533  Anxiety: -PRN Xanax  DVT ppx: SQ Heparin  Code Status: Full code (I discussed with patient about code status, and explained the meaning of full CODE STATUS, he wants to be full code today). Family Communication: None at bed side.    Disposition Plan:  Anticipate discharge back to previous home environment Consults called:  none Admission status:   Inpatient/tele      Date of Service 11/16/2017    Ivor Costa [facility] Pager [phone]  If 7PM-7AM, please contact night-coverage [email or web address] Password [email or web address] 11/16/2017, 1:53 AM

## 2017-11-15 NOTE — ED Notes (Signed)
Pt back from X-ray.  

## 2017-11-15 NOTE — ED Notes (Signed)
Pt requesting something for pain, RN notified

## 2017-11-15 NOTE — ED Notes (Signed)
LEFT FOR CT

## 2017-11-15 NOTE — ED Provider Notes (Signed)
[facility] EMERGENCY DEPARTMENT Provider Note   CSN: [id number] Arrival date & time: 11/15/17  0844     History   Chief Complaint Chief Complaint  Patient presents with  . Abdominal Pain    HPI [name] is a 57 y.o. male.  HPI   [name] is a 57yo male with a history of ESRD (HD MWF), chronic hepatitis B and C complicated by cirrhosis and ascites, alcoholism, COPD, diastolic CHF, CAD who presents to the emergency department for evaluation of abdominal pain.  Of note, patient was recently hospitalized for 8 days for presumed SBP in which he had a paracentesis of 3L which showed 2100WBCs, but had negative cultures.  ID was consulted recommended 14d IV Ceftazidime, but patient left AMA abruptly without formal discharge.  Patient reports that he has had persistent abdominal pain since leaving the hospital.  Reports pain is located over the umbilicus and is 81/19 in severity and feels sharp in nature.  States that his abdomen feels distended and he feels full after only eating a small amount of food.  He states that he went to dialysis yesterday and got antibiotic following.  Also reports feeling short of breath, which he attributes to his abdominal pain. Has had a dry cough. Endorses some pain with urination, although he does not consistently urinate. He denies fevers, chills, nausea/vomiting, diarrhea, chest pain, lightheadedness or syncope.  He went to hemodialysis yesterday.  States that he was on his way to the pain clinic as he ran out of oxycodone yesterday, but the pain was so bad that he came to the ER to have Dilaudid. Last bowel movement yesterday and normal for him. He denies previous abdominal surgeries.   Past Medical History:  Diagnosis Date  . Adenomatous colon polyp    tubular  . Anemia   . Anxiety   . Arthritis    left shoulder  . Atherosclerosis of aorta ([place])   . Cardiomegaly   . Chest pain    DATE UNKNOWN, C/O PERIODICALLY  .  Cocaine abuse ([place])   . COPD exacerbation ([place]) 08/17/2016  . Coronary artery disease    stent 02/22/17  . Dialysis patient ([place])    Monday-Wednesday-Friday  . ESRD (end stage renal disease) on dialysis ([place])    "[name]; MWF" (07/04/2017)  . GERD (gastroesophageal reflux disease)    DATE UNKNOWN  . Hemorrhoids   . Hepatitis B, chronic ([place])   . Hepatitis C   . History of kidney stones   . Hyperkalemia   . Hypertension   . Metabolic bone disease    Patient denies  . Mitral stenosis   . Myocardial infarction ([place])   . Pneumonia   . Pulmonary edema   . Renal disorder   . Renal insufficiency   . Shortness of breath dyspnea    " for the last past year with this dialysis"  . Solitary rectal ulcer syndrome   . Tubular adenoma of colon     Patient Active Problem List   Diagnosis Date Noted  . DNR (do not resuscitate)   . Palliative care by specialist   . Hyponatremia 11/04/2017  . SBP (spontaneous bacterial peritonitis) ([place]): PRESUMED. 10/30/2017  . Liver disease, chronic 10/30/2017  . SOB (shortness of breath)   . Abdominal pain 10/28/2017  . Upper airway cough syndrome with flattening on f/v loop 10/13/17 c/w vcd 10/17/2017  . Elevated diaphragm 10/13/2017  . Ileus ([place]) 09/29/2017  . QT prolongation 09/29/2017  .  Malnutrition of moderate degree 09/29/2017  . Sinus congestion 09/03/2017  . Symptomatic anemia 09/02/2017  . Other cirrhosis of liver ([place]) 09/02/2017  . Left bundle branch block 09/02/2017  . Mitral stenosis 09/02/2017  . Hematochezia 07/15/2017  . Wide-complex tachycardia ([place])   . Endotracheally intubated   . ESRD (end stage renal disease) ([place]) 07/04/2017  . Acute respiratory failure with hypoxia ([place]) 06/18/2017  . CKD (chronic kidney disease) stage V requiring chronic dialysis ([place]) 06/18/2017  . History of Cocaine abuse ([place]) 06/18/2017  . Hypertension 06/18/2017  . Infection of AV graft for dialysis ([place]) 06/18/2017  . Anxiety 06/18/2017  .  Anemia due to chronic kidney disease 06/18/2017  . Atrial flutter with rapid ventricular response ([place]) 06/18/2017  . Personality disorder ([place]) 06/13/2017  . Cellulitis 06/12/2017  . Adjustment disorder with mixed anxiety and depressed mood 06/10/2017  . Suicidal ideation 06/10/2017  . Arm wound, left, sequela 06/10/2017  . Dyspnea on exertion 05/29/2017  . Tachycardia 05/29/2017  . Hyperkalemia 05/22/2017  . Acute metabolic encephalopathy   . Anemia 04/23/2017  . Ascites 04/23/2017  . COPD (chronic obstructive pulmonary disease) ([place]) 04/23/2017  . Acute on chronic respiratory failure with hypoxia ([place]) 03/25/2017  . Arrhythmia 03/25/2017  . COPD GOLD 0 with flattening on inps f/v  09/27/2016  . Essential hypertension 09/27/2016  . Fluid overload 08/30/2016  . COPD exacerbation ([place]) 08/17/2016  . Hypertensive urgency 08/17/2016  . Respiratory failure ([place]) 08/17/2016  . Problem with dialysis access ([place]) 07/23/2016  . End stage renal disease ([place]) 12/02/2015  . Chronic hepatitis B ([place]) 03/05/2014  . Chronic hepatitis C without hepatic coma ([place]) 03/05/2014  . Internal hemorrhoids with bleeding, swelling and itching 03/05/2014  . Thrombocytopenia ([place]) 03/05/2014  . Chest pain 02/27/2014  . Alcohol abuse 04/14/2009  . Cigarette smoker 04/14/2009  . GANGLION CYST 04/14/2009    Past Surgical History:  Procedure Laterality Date  . A/V FISTULAGRAM Left 05/26/2017   Procedure: A/V FISTULAGRAM;  Surgeon: Conrad [place], MD;  Location: [facility];  Service: Cardiovascular;  Laterality: Left;  . APPLICATION OF WOUND VAC Left 06/14/2017   Procedure: APPLICATION OF WOUND VAC;  Surgeon: Katha Cabal, MD;  Location: [facility];  Service: Vascular;  Laterality: Left;  . AV FISTULA PLACEMENT  2012   BELIEVED WAS PLACED IN JUNE  . AV FISTULA PLACEMENT Right 08/09/2017   Procedure: Creation Right arm ARTERIOVENOUS BRACHIOCEPOHALIC FISTULA;  Surgeon: Elam Dutch, MD;   Location: [facility];  Service: Vascular;  Laterality: Right;  . COLONOSCOPY    . CORONARY STENT INTERVENTION N/A 02/22/2017   Procedure: CORONARY STENT INTERVENTION;  Surgeon: Nigel Mormon, MD;  Location: [facility];  Service: Cardiovascular;  Laterality: N/A;  . FLEXIBLE SIGMOIDOSCOPY N/A 07/15/2017   Procedure: FLEXIBLE SIGMOIDOSCOPY;  Surgeon: Carol Ada, MD;  Location: [facility];  Service: Endoscopy;  Laterality: N/A;  . HEMORRHOID BANDING    . I&D EXTREMITY Left 06/01/2017   Procedure: IRRIGATION AND DEBRIDEMENT LEFT ARM HEMATOMA WITH LIGATION OF LEFT ARM AV FISTULA;  Surgeon: Elam Dutch, MD;  Location: [place] [facility];  Service: Vascular;  Laterality: Left;  . I&D EXTREMITY Left 06/14/2017   Procedure: IRRIGATION AND DEBRIDEMENT EXTREMITY;  Surgeon: Katha Cabal, MD;  Location: [facility];  Service: Vascular;  Laterality: Left;  . INSERTION OF DIALYSIS CATHETER  05/30/2017  . INSERTION OF DIALYSIS CATHETER N/A 05/30/2017   Procedure: INSERTION OF DIALYSIS CATHETER;  Surgeon: Elam Dutch, MD;  Location: [facility];  Service: Vascular;  Laterality: N/A;  . IR PARACENTESIS  08/30/2017  . IR PARACENTESIS  09/29/2017  . IR PARACENTESIS  10/28/2017  . IR PARACENTESIS  11/09/2017  . LEFT HEART CATH AND CORONARY ANGIOGRAPHY N/A 02/22/2017   Procedure: LEFT HEART CATH AND CORONARY ANGIOGRAPHY;  Surgeon: Nigel Mormon, MD;  Location: [facility];  Service: Cardiovascular;  Laterality: N/A;  . LIGATION OF ARTERIOVENOUS  FISTULA Left 07/17/1759   Procedure: Plication of Left Arm Arteriovenous Fistula;  Surgeon: Elam Dutch, MD;  Location: [facility];  Service: Vascular;  Laterality: Left;  . POLYPECTOMY    . REVISON OF ARTERIOVENOUS FISTULA Left 12/16/3708   Procedure: PLICATION OF DISTAL ANEURYSMAL SEGEMENT OF LEFT UPPER ARM ARTERIOVENOUS FISTULA;  Surgeon: Elam Dutch, MD;  Location: [facility];  Service: Vascular;  Laterality: Left;  . REVISON OF ARTERIOVENOUS FISTULA Left  01/05/9484   Procedure: Plication of Left Upper Arm Fistula ;  Surgeon: Waynetta Sandy, MD;  Location: [facility];  Service: Vascular;  Laterality: Left;  . SKIN GRAFT SPLIT THICKNESS LEG / FOOT Left    SKIN GRAFT SPLIT THICKNESS LEFT ARM DONOR SITE: LEFT ANTERIOR THIGH  . SKIN SPLIT GRAFT Left 07/04/2017   Procedure: SKIN GRAFT SPLIT THICKNESS LEFT ARM DONOR SITE: LEFT ANTERIOR THIGH;  Surgeon: Elam Dutch, MD;  Location: [facility];  Service: Vascular;  Laterality: Left;  . THROMBECTOMY W/ EMBOLECTOMY Left 06/05/2017   Procedure: EXPLORATION OF LEFT ARM FOR BLEEDING; OVERSEWED PROXIMAL FISTULA;  Surgeon: Angelia Mould, MD;  Location: [facility];  Service: Vascular;  Laterality: Left;  . WOUND EXPLORATION Left 06/03/2017   Procedure: WOUND EXPLORATION WITH WOUND VAC APPLICATION TO LEFT ARM;  Surgeon: Angelia Mould, MD;  Location: [facility];  Service: Vascular;  Laterality: Left;        Home Medications    Prior to Admission medications   Medication Sig Start Date End Date Taking? Authorizing Provider  albuterol (VENTOLIN HFA) 108 (90 Base) MCG/ACT inhaler Inhale 2 puffs into the lungs every 6 (six) hours as needed for wheezing or shortness of breath.    [provider]  ALPRAZolam Duanne Moron) 0.25 MG tablet Take 1 tablet (0.25 mg total) by mouth 3 (three) times daily as needed for anxiety or sleep. 10/30/17   Eugenie Filler, MD  budesonide-formoterol [facility]) 80-4.5 MCG/ACT inhaler Inhale 2 puffs into the lungs 2 (two) times daily. 10/13/17   Tanda Rockers, MD  cefTAZidime (FORTAZ) 1 g SOLR injection Inject 1 g into the vein every Monday, Wednesday, and Friday. In HD x 1 week. Patient not taking: Reported on 11/13/2017 10/31/17   Eugenie Filler, MD  clopidogrel (PLAVIX) 75 MG tablet Take 1 tablet (75 mg total) by mouth daily. 07/15/17   Rhyne, Hulen Shouts, PA-C  diltiazem (CARDIZEM CD) 120 MG 24 hr capsule Take 1 capsule (120 mg total) by mouth daily. 06/23/17  06/23/18  Deatra James, MD  famotidine (PEPCID) 20 MG tablet One at bedtime Patient taking differently: Take 20 mg by mouth at bedtime.  09/08/17   Tanda Rockers, MD  fluticasone (FLONASE) 50 MCG/ACT nasal spray Place 2 sprays into both nostrils daily. 10/31/17   Eugenie Filler, MD  gabapentin (NEURONTIN) 100 MG capsule Take 1 capsule (100 mg total) by mouth at bedtime. Patient taking differently: Take 100 mg by mouth 2 (two) times daily.  09/30/17   Mariel Aloe, MD  guaiFENesin (ROBITUSSIN) 100 MG/5ML SOLN  Take 5 mLs (100 mg total) by mouth every 4 (four) hours as needed for cough or to loosen phlegm. Patient not taking: Reported on 11/13/2017 10/30/17   Eugenie Filler, MD  hydrALAZINE (APRESOLINE) 100 MG tablet Take 1 tablet by mouth twice a day take after HD on dialysis days 08/31/17   [provider]  isosorbide mononitrate (IMDUR) 30 MG 24 hr tablet Take 1 tablet (30 mg total) by mouth daily. 06/17/17   [name], MD  loratadine (CLARITIN) 10 MG tablet Take 1 tablet (10 mg total) by mouth daily. 10/31/17   Eugenie Filler, MD  nicotine (NICODERM CQ - DOSED IN MG/24 HOURS) 21 mg/24hr patch Place 21 mg onto the skin daily.    [provider]  Nutritional Supplements (FEEDING SUPPLEMENT, NEPRO CARB STEADY,) LIQD Take 237 mLs by mouth 2 (two) times daily between meals. 09/30/17   Mariel Aloe, MD  oxyCODONE (ROXICODONE) 5 MG immediate release tablet Take 1 tablet (5 mg total) by mouth every 4 (four) hours as needed for severe pain. 11/13/17   Tanna Furry, MD  pantoprazole (PROTONIX) 40 MG tablet Take 1 tablet (40 mg total) by mouth daily. Take 30-60 min before first meal of the day Patient not taking: Reported on 11/06/2017 09/08/17   Tanda Rockers, MD  sevelamer carbonate (RENVELA) 800 MG tablet Take 1,600-3,200 mg by mouth See admin instructions. Take 4 tablets (887m each) three times daily and 2 tablets twice daily with a snack. 10/31/17   [provider]   tiotropium (SPIRIVA) 18 MCG inhalation capsule Place 18 mcg into inhaler and inhale daily.    [provider]    Family History Family History  Problem Relation Age of Onset  . Heart disease Mother   . Lung cancer Mother   . Heart disease Father   . Malignant hyperthermia Father   . COPD Father   . Throat cancer Sister   . Esophageal cancer Sister   . Hypertension Other   . COPD Other   . Colon cancer Neg Hx   . Colon polyps Neg Hx   . Rectal cancer Neg Hx   . Stomach cancer Neg Hx     Social History Social History   Tobacco Use  . Smoking status: Current Every Day Smoker    Packs/day: 0.50    Years: 43.00    Pack years: 21.50    Types: Cigarettes    Start date: 08/13/1973  . Smokeless tobacco: Never Used  Substance Use Topics  . Alcohol use: Not Currently    Frequency: Never    Comment: quit drinking in 2017  . Drug use: Not Currently    Comment: quit in 2017"     Allergies   Aspirin; Tylenol [acetaminophen]; Clonidine derivatives; and Tramadol   Review of Systems Review of Systems  Constitutional: Negative for chills and fever.  HENT: Negative for congestion.   Eyes: Negative for visual disturbance.  Respiratory: Negative for shortness of breath.   Cardiovascular: Negative for chest pain.  Gastrointestinal: Positive for abdominal pain. Negative for blood in stool, diarrhea, nausea and vomiting.  Genitourinary: Positive for difficulty urinating (patient does not make urine due to ESRD).  Musculoskeletal: Negative for back pain.  Skin: Negative for rash.  Neurological: Negative for syncope, weakness, light-headedness and numbness.  Psychiatric/Behavioral: Negative for agitation.     Physical Exam Updated Vital Signs BP (!) 121/109   Pulse (!) 112   Temp 98.8 F (37.1 C) (Oral)  Resp 15   Ht _0  (1.753 m)   Wt 70.3 kg (155 lb)   SpO2 99%   BMI 22.89 kg/m   Physical Exam  Constitutional: He is oriented to person, place, and time. He  appears well-developed and well-nourished. No distress.  HENT:  Head: Normocephalic and atraumatic.  Mouth/Throat: Oropharynx is clear and moist. No oropharyngeal exudate.  Eyes: Pupils are equal, round, and reactive to light. Conjunctivae are normal. Right eye exhibits no discharge. Left eye exhibits no discharge.  Neck: Normal range of motion. Neck supple.  Cardiovascular:  Tachycardic, regular rate.   Pulmonary/Chest: Effort normal. No respiratory distress.  No respiratory distress, speaking in full sentences. End expiratory wheezing heard in bilateral bases. No crackles or stridor.    Abdominal:  Abdomen soft and somewhat distended. Mildly tender to palpation grossly over the abdomen, no focal tenderness. No guarding or rigidity. No rebound tenderness.   Musculoskeletal: Normal range of motion.  Neurological: He is alert and oriented to person, place, and time. Coordination normal.  Skin: Skin is warm and dry. Capillary refill takes less than 2 seconds. He is not diaphoretic.  Psychiatric: He has a normal mood and affect. His behavior is normal.  Nursing note and vitals reviewed.    ED Treatments / Results  Labs (all labs ordered are listed, but only abnormal results are displayed) Labs Reviewed  COMPREHENSIVE METABOLIC PANEL - Abnormal; Notable for the following components:      Result Value   Sodium 133 (*)    Chloride 93 (*)    Glucose, Bld 131 (*)    BUN 37 (*)    Creatinine, Ser 5.15 (*)    Calcium 8.5 (*)    Albumin 2.9 (*)    Alkaline Phosphatase 190 (*)    GFR calc non Af Amer 11 (*)    GFR calc Af Amer 13 (*)    All other components within normal limits  CBC - Abnormal; Notable for the following components:   WBC 11.4 (*)    RBC 3.87 (*)    Hemoglobin 9.7 (*)    HCT 29.0 (*)    MCV 74.9 (*)    MCH 25.1 (*)    RDW 21.8 (*)    All other components within normal limits  AMMONIA - Abnormal; Notable for the following components:   Ammonia 40 (*)    All other  components within normal limits  URINE CULTURE  LIPASE, BLOOD  PROTIME-INR  URINALYSIS, ROUTINE W REFLEX MICROSCOPIC  I-STAT CG4 LACTIC ACID, ED    EKG None  Radiology Dg Chest 2 View  Result Date: 11/15/2017 CLINICAL DATA:  Abdominal distention with productive cough. EXAM: CHEST - 2 VIEW COMPARISON:  None. FINDINGS: The heart is enlarged. Dialysis catheter tips project over the RIGHT atrium. Mild vascular congestion without consolidation or edema. No effusion or pneumothorax. No acute osseous findings. IMPRESSION: Cardiomegaly. Mild vascular congestion. No frank edema or consolidation. Electronically Signed   By: Staci Righter M.D.   On: 11/15/2017 19:37    Procedures Procedures (including critical care time)  Medications Ordered in ED Medications  HYDROmorphone (DILAUDID) injection 1 mg (1 mg Intravenous Given 11/15/17 1439)  HYDROmorphone (DILAUDID) injection 1 mg (1 mg Intravenous Given 11/15/17 1607)  HYDROmorphone (DILAUDID) injection 1 mg (1 mg Intravenous Given 11/15/17 1918)     Initial Impression / Assessment and Plan / ED Course  I have reviewed the triage vital signs and the nursing notes.  Pertinent labs & imaging results  that were available during my care of the patient were reviewed by me and considered in my medical decision making (see chart for details).     Patient presents to the ED for evaluation of abdominal pain.  He recently left AMA after being admitted for presumed SBP where paracentesis fluid had 2100 WBC with 72% neutrophils, but cultures negative.  ID was consulted and recommended 14 days IV ceftazidime and oral Diflucan.   On exam patient is afebrile and nontoxic-appearing.  He is diffusely tender to palpation of the abdomen without guarding or rigidity.  No rebound tenderness. No nausea/vomiting, he is able to tolerate food and fluids at the bedside.  Lab work reveals improving leukocytosis with WBC count 11.4 versus 13.0 drawn three days ago.  Hemoglobin  9.7 which is baseline for him.  CMP without any major electrolyte abnormalities.  Alk phos elevated, although this is baseline.  No lactic acidosis.  INR within normal limits.    Patient has been persistently tachycardic in the ED despite p.o. fluids and pain management including several rounds of dilaudid. Chest x-ray ordered given persistent tachycardia and patient reporting some shortness of breath with cough.  Reveals mild vascular congestion and stable cardiomegaly.  No pneumonia.  Patient unable to urinate in the ED for urine sample.   Discussed this patient with hospitalist Dr. Blaine Hamper who will admit patient for partially treated SBP.  Patient informed.  This was a shared visit with Dr. Sherry Ruffing who also saw the patient and agrees with plan to admit patient.  Final Clinical Impressions(s) / ED Diagnoses   Final diagnoses:  None    ED Discharge Orders    None       Bernarda Caffey 11/15/17 2158    Tegeler, Gwenyth Allegra, MD 11/15/17 [phone]

## 2017-11-16 ENCOUNTER — Inpatient Hospital Stay (HOSPITAL_COMMUNITY): Payer: Medicare Other

## 2017-11-16 ENCOUNTER — Encounter (HOSPITAL_COMMUNITY): Payer: Self-pay | Admitting: Radiology

## 2017-11-16 DIAGNOSIS — K219 Gastro-esophageal reflux disease without esophagitis: Secondary | ICD-10-CM

## 2017-11-16 HISTORY — PX: IR PARACENTESIS: IMG2679

## 2017-11-16 LAB — BODY FLUID CELL COUNT WITH DIFFERENTIAL
Eos, Fluid: 0 %
Lymphs, Fluid: 10 %
Monocyte-Macrophage-Serous Fluid: 15 % — ABNORMAL LOW (ref 50–90)
Neutrophil Count, Fluid: 75 % — ABNORMAL HIGH (ref 0–25)
Total Nucleated Cell Count, Fluid: 2075 cu mm — ABNORMAL HIGH (ref 0–1000)

## 2017-11-16 LAB — CBG MONITORING, ED: Glucose-Capillary: 75 mg/dL (ref 65–99)

## 2017-11-16 MED ORDER — OXYCODONE HCL 5 MG PO TABS
5.0000 mg | ORAL_TABLET | Freq: Once | ORAL | Status: AC
  Administered 2017-11-16: 5 mg via ORAL

## 2017-11-16 MED ORDER — SODIUM CHLORIDE 0.9 % IV SOLN: 100.0000 mL | INTRAVENOUS | Status: DC | PRN

## 2017-11-16 MED ORDER — ONDANSETRON HCL 4 MG/2ML IJ SOLN
4.0000 mg | Freq: Once | INTRAMUSCULAR | Status: AC
  Administered 2017-11-16: 4 mg via INTRAVENOUS
  Filled 2017-11-16: qty 2

## 2017-11-16 MED ORDER — IOHEXOL 300 MG/ML  SOLN
75.0000 mL | Freq: Once | INTRAMUSCULAR | Status: AC | PRN
  Administered 2017-11-16: 75 mL via INTRAVENOUS

## 2017-11-16 MED ORDER — SEVELAMER CARBONATE 800 MG PO TABS
3200.0000 mg | ORAL_TABLET | Freq: Three times a day (TID) | ORAL | Status: DC
  Administered 2017-11-16 – 2017-11-19 (×5): 3200 mg via ORAL
  Filled 2017-11-16 (×7): qty 4

## 2017-11-16 MED ORDER — ALBUMIN HUMAN 25 % IV SOLN
12.5000 g | Freq: Once | INTRAVENOUS | Status: AC
  Administered 2017-11-16: 12.5 g via INTRAVENOUS
  Filled 2017-11-16: qty 50

## 2017-11-16 MED ORDER — HEPARIN SODIUM (PORCINE) 5000 UNIT/ML IJ SOLN: 5000.0000 [IU] | Freq: Three times a day (TID) | INTRAMUSCULAR | Status: DC

## 2017-11-16 MED ORDER — LIDOCAINE HCL (PF) 2 % IJ SOLN
INTRAMUSCULAR | Status: AC | PRN
  Administered 2017-11-16: 10 mL

## 2017-11-16 MED ORDER — LIDOCAINE HCL (PF) 2 % IJ SOLN
INTRAMUSCULAR | Status: AC
Start: 2017-11-16 — End: 2017-11-17
  Filled 2017-11-16: qty 20

## 2017-11-16 MED ORDER — LIDOCAINE HCL (PF) 1 % IJ SOLN: 5.0000 mL | INTRAMUSCULAR | Status: DC | PRN

## 2017-11-16 MED ORDER — MORPHINE SULFATE (PF) 4 MG/ML IV SOLN
2.0000 mg | INTRAVENOUS | Status: DC | PRN
  Administered 2017-11-16 – 2017-11-19 (×9): 2 mg via INTRAVENOUS
  Filled 2017-11-16 (×9): qty 1

## 2017-11-16 MED ORDER — SEVELAMER CARBONATE 800 MG PO TABS
800.0000 mg | ORAL_TABLET | Freq: Three times a day (TID) | ORAL | Status: DC
  Filled 2017-11-16: qty 1

## 2017-11-16 MED ORDER — DIPHENHYDRAMINE HCL 25 MG PO CAPS
25.0000 mg | ORAL_CAPSULE | Freq: Once | ORAL | Status: AC
  Administered 2017-11-16: 25 mg via ORAL
  Filled 2017-11-16: qty 1

## 2017-11-16 MED ORDER — LIDOCAINE-PRILOCAINE 2.5-2.5 % EX CREA
1.0000 "application " | TOPICAL_CREAM | CUTANEOUS | Status: DC | PRN
  Filled 2017-11-16: qty 5

## 2017-11-16 MED ORDER — DARBEPOETIN ALFA 40 MCG/0.4ML IJ SOSY
40.0000 ug | PREFILLED_SYRINGE | INTRAMUSCULAR | Status: DC
  Filled 2017-11-16: qty 0.4

## 2017-11-16 MED ORDER — HEPARIN SODIUM (PORCINE) 5000 UNIT/ML IJ SOLN
5000.0000 [IU] | Freq: Three times a day (TID) | INTRAMUSCULAR | Status: DC
Start: 2017-11-17 — End: 2017-11-19
  Filled 2017-11-16 (×3): qty 1

## 2017-11-16 MED ORDER — PENTAFLUOROPROP-TETRAFLUOROETH EX AERO
1.0000 "application " | INHALATION_SPRAY | CUTANEOUS | Status: DC | PRN
  Filled 2017-11-16: qty 30

## 2017-11-16 MED ORDER — ALPRAZOLAM 0.25 MG PO TABS
0.2500 mg | ORAL_TABLET | Freq: Two times a day (BID) | ORAL | Status: DC | PRN
  Administered 2017-11-16 – 2017-11-19 (×3): 0.25 mg via ORAL
  Filled 2017-11-16 (×3): qty 1

## 2017-11-16 MED ORDER — SEVELAMER CARBONATE 800 MG PO TABS
1600.0000 mg | ORAL_TABLET | Freq: Two times a day (BID) | ORAL | Status: DC | PRN
  Filled 2017-11-16: qty 2

## 2017-11-16 MED ORDER — ALTEPLASE 2 MG IJ SOLR
2.0000 mg | Freq: Once | INTRAMUSCULAR | Status: DC | PRN
  Filled 2017-11-16: qty 2

## 2017-11-16 MED ORDER — ONDANSETRON HCL 4 MG/2ML IJ SOLN
4.0000 mg | Freq: Four times a day (QID) | INTRAMUSCULAR | Status: DC | PRN
  Filled 2017-11-16: qty 2

## 2017-11-16 MED ORDER — HEPARIN SODIUM (PORCINE) 1000 UNIT/ML DIALYSIS
1000.0000 [IU] | INTRAMUSCULAR | Status: DC | PRN
  Filled 2017-11-16: qty 1

## 2017-11-16 NOTE — ED Notes (Signed)
Pharmacist notified on pt.'s Albumin order.

## 2017-11-16 NOTE — ED Notes (Signed)
Pt back from IR 

## 2017-11-16 NOTE — ED Notes (Signed)
Called pharmacy about meds and theysaid the darbepoetin was to be given when pt went to dialysis and the other one Revela was to be given with food, pt has been NPO

## 2017-11-16 NOTE — ED Notes (Signed)
Ordered pt a lunch tray 

## 2017-11-16 NOTE — Telephone Encounter (Signed)
Attempted to call the pt. I did not receive an answer. I have left a message for pt to return our call.  

## 2017-11-16 NOTE — ED Notes (Signed)
Pt c/o pain before going for paracentesis

## 2017-11-16 NOTE — ED Notes (Signed)
Pt requesting additional oxycodone and requesting to eat. Admitting MD paged

## 2017-11-16 NOTE — ED Notes (Signed)
Admit dr here to see pt ,  Dr informed that pt wanted dilaudid for pain and that his abd hurts , dr states that he is not giving pt dialudid, states he is putting in for pt to go to Ct and he will put in zofran for nausea

## 2017-11-16 NOTE — ED Notes (Signed)
Pt stated he has to urinate, was given urinal to use.

## 2017-11-16 NOTE — Progress Notes (Signed)
Patient Demographics:    [name], is a 57 y.o. male, DOB - 1961/01/11, EXB:[id number]  Admit date - 11/15/2017   Admitting Physician Ivor Costa, MD  Outpatient Primary MD for the patient is Joshua Mccreedy, MD  LOS - 1   Chief Complaint  Patient presents with  . Abdominal Pain        Subjective:    [name] today has no fevers, no emesis,  No chest pain, complains of abdominal pain, had BMs, CT abdomen and pelvis on 11/16/17 without acute findings except for ascites  Assessment  & Plan :    Principal Problem:   SBP (spontaneous bacterial peritonitis) ([place]) Active Problems:   Alcohol abuse   Cigarette smoker   Essential hypertension   COPD (chronic obstructive pulmonary disease) ([facility])   History of Cocaine abuse ([place])   Hypertension   Anemia due to chronic kidney disease   ESRD on dialysis ([facility])   Abdominal pain   Liver cirrhosis ([facility])   GERD (gastroesophageal reflux disease)   Brief summary  57 y.o. male with medical history significant of alcohol abuse, alcoholic liver cirrhosis with ascites, hypertension, COPD, GERD, anxiety, anemia, polysubstance abuse (tobacco, cocaine and marijuana), HBV, HCV, CAD, ESRD-HD (MWF) admitted 11/15/2017 with abdominal pain in the setting of recently diagnosed SBP and recurrent ascites.    1)SBP-  Pt was Discharged on  11/12/2017, had paracentesis on 11/09/2017 during recent admission, WBC cell count was 2100,  peritoneal/ascitic fluid Gram stain and culture from 11/09/2017 remains negative to date, during last admission infectious disease recommended completing 14 days of IV cefepime and fluconazole, please note that IV cefepime was started around 11/11/2017, patient is for repeat pressure densities on 11/16/2017 with ascitic fluid Gram stain and culture.   2)ESRD on M/W/F--- hemodialysis per nephrology team, patient is notoriously noncompliant and often leaves  AMA before hemodialysis sessions are completed.  Discussed with Dr. Marval Regal   3)Alcoholic, hep B &C cirrhosis with ascites/SBP: Please see #1 above  4)Poor maturing right AVF: As per Dr. Valinda Hoar, vascular surgery,  Plan is for fistulogram when infection improves  5)Essential hypertension: Poorly controlled due to noncompliance,cntinue diltiazem, hydralazine, Imdur.   6)CAD status post stent to RCA:  no chest pains or ACS type symptoms, continue Plavix and Imdur.  7)Paroxysmal A. NUU:[id number] diltiazem.-No chronic anticoagulation as patient is poorly compliant  8)Anemia of ESRD: Stable., EPO per nephrology team  9)Social/Ethics- Palliative care consulted on 11/08/17, initially made DNR during previous admission, pt has ??? About full code at this time,   Consultants: Nephrology IR  Procedures: Ultrasound-guided paracentesis by IR 11/16/17 HD  Code Status : Full   Disposition Plan  : home in 2 to 3 days    DVT Prophylaxis  :  - Heparin -   Lab Results  Component Value Date   PLT 226 11/15/2017    Inpatient Medications  Scheduled Meds: . clopidogrel  75 mg Oral Daily  . darbepoetin (ARANESP) injection - DIALYSIS  40 mcg Intravenous Q Wed-HD  . diltiazem  120 mg Oral Daily  . feeding supplement (NEPRO CARB STEADY)  237 mL Oral BID BM  . fluconazole  200 mg Oral Daily  . fluticasone  2 spray Each Nare Daily  . gabapentin  100 mg Oral BID  . hydrALAZINE  10 mg Oral BID  . isosorbide mononitrate  30 mg Oral Daily  . lactulose  20 g Oral BID  . lidocaine      . loratadine  10 mg Oral Daily  . mometasone-formoterol  2 puff Inhalation BID  . nicotine  21 mg Transdermal Daily  . pantoprazole  40 mg Oral Daily  . sevelamer carbonate  3,200 mg Oral TID WC  . tiotropium  18 mcg Inhalation Daily   Continuous Infusions: . sodium chloride    . sodium chloride    . albumin human    . cefTAZidime (FORTAZ)  IV     PRN Meds:.sodium chloride, sodium chloride,  albuterol, alteplase, guaiFENesin, heparin, hydrALAZINE, hydrOXYzine, lidocaine (PF), lidocaine, lidocaine-prilocaine, morphine injection, ondansetron (ZOFRAN) IV, oxyCODONE, pentafluoroprop-tetrafluoroeth, sevelamer carbonate, zolpidem    Anti-infectives (From admission, onward)   Start     Dose/Rate Route Frequency Ordered Stop   11/16/17 1800  cefTAZidime (FORTAZ) 2 g in sodium chloride 0.9 % 100 mL IVPB     2 g 200 mL/hr over 30 Minutes Intravenous Every M-W-F (1800) 11/15/17 2151     11/15/17 2200  cefTAZidime (FORTAZ) 1 g in sodium chloride 0.9 % 100 mL IVPB     1 g 200 mL/hr over 30 Minutes Intravenous  Once 11/15/17 2151 11/15/17 2353   11/15/17 2200  fluconazole (DIFLUCAN) tablet 200 mg     200 mg Oral Daily 11/15/17 2151          Objective:   Vitals:   11/16/17 0430 11/16/17 0700 11/16/17 1200 11/16/17 1450  BP: (!) 171/84  (!) 148/85 (!) 141/82  Pulse: 93  88   Resp: 14 18 15    Temp:      TempSrc:      SpO2: 97%  94%   Weight:      Height:        Wt Readings from Last 3 Encounters:  11/15/17 70.3 kg (155 lb)  11/12/17 75.8 kg (167 lb 1.7 oz)  10/29/17 68 kg (149 lb 14.6 oz)     Intake/Output Summary (Last 24 hours) at 11/16/2017 1522 Last data filed at 11/15/2017 2353 Gross per 24 hour  Intake 100 ml  Output -  Net 100 ml     Physical Exam  Gen:- Awake Alert, in no acute distress HEENT:- [place].AT, No sclera icterus Neck-Supple Neck,No JVD,.  Hemodialysis catheter site is clean dry and intact Lungs-diminished in bases, no wheezing CV- S1, S2 normal Abd-  +ve B.Sounds, Abd Soft, No tenderness,    Extremity/Skin:- No  edema,  AV fistula site Psych-affect is appropriate, oriented x3 Neuro-no new focal deficits, no tremors   Data Review:   Micro Results Recent Results (from the past 240 hour(s))  Gram stain     Status: None   Collection Time: 11/09/17  2:41 PM  Result Value Ref Range Status   Specimen Description PERITONEAL  Final   Special Requests  NONE  Final   Gram Stain   Final    ABUNDANT WBC PRESENT,BOTH PMN AND MONONUCLEAR NO ORGANISMS SEEN Performed at [facility], [place] [address], [place], [place] [address]    Report Status 11/09/2017 FINAL  Final  Culture, body fluid-bottle     Status: None   Collection Time: 11/09/17  2:41 PM  Result Value Ref Range Status   Specimen Description PERITONEAL  Final   Special Requests NONE  Final   Culture   Final  NO GROWTH 5 DAYS Performed at [facility], [place] [address], [place], [place] [address]    Report Status 11/14/2017 FINAL  Final    Radiology Reports Dg Chest 2 View  Result Date: 11/15/2017 CLINICAL DATA:  Abdominal distention with productive cough. EXAM: CHEST - 2 VIEW COMPARISON:  None. FINDINGS: The heart is enlarged. Dialysis catheter tips project over the RIGHT atrium. Mild vascular congestion without consolidation or edema. No effusion or pneumothorax. No acute osseous findings. IMPRESSION: Cardiomegaly. Mild vascular congestion. No frank edema or consolidation. Electronically Signed   By: Staci Righter M.D.   On: 11/15/2017 19:37   Dg Chest 2 View  Result Date: 10/27/2017 CLINICAL DATA:  Sharp abdominal pain for 3 days.  Dyspnea. EXAM: CHEST - 2 VIEW COMPARISON:  10/03/2017 FINDINGS: Stable cardiomegaly with aortic atherosclerosis. Dialysis catheter tip from right IJ approach terminates in the mid right atrium. Interstitial edema is noted with left basilar atelectasis and chronic elevation of the left hemidiaphragm. No acute nor suspicious osseous abnormality. IMPRESSION: 1. Stable cardiomegaly with mild interstitial pulmonary edema, slightly worse than on comparison. No effusion or pneumothorax. 2. Aortic atherosclerosis without aneurysm. Electronically Signed   By: Ashley Royalty M.D.   On: 10/27/2017 18:45   Ct Abdomen Pelvis W Contrast  Result Date: 11/16/2017 CLINICAL DATA:  Chronic periumbilical abdominal pain. Loss of appetite. Dialysis patient. EXAM: CT  ABDOMEN AND PELVIS WITH CONTRAST TECHNIQUE: Multidetector CT imaging of the abdomen and pelvis was performed using the standard protocol following bolus administration of intravenous contrast. CONTRAST:  24mL OMNIPAQUE IOHEXOL 300 MG/ML  SOLN COMPARISON:  10/25/2017 FINDINGS: Lower chest: Cardiomegaly.  Lung bases are clear.  No pleural fluid. Hepatobiliary: Cirrhosis as manifest by lobulation of the liver surface and enlargement of the left lobe and caudate lobe. No definable focal mass. No calcified gallstones. Portal vein shows flow. Pancreas: Normal Spleen: Normal size.  No focal lesion. Adrenals/Urinary Tract: No adrenal mass. Both kidneys are small with extensive vascular calcification and a 1 cm cyst at the lower pole on the left. Stomach/Bowel: No bowel pathology discernible. No evidence of obstruction. Clip in the region of the rectum as seen previously as distant as 09/02/2017 Vascular/Lymphatic: Advanced atherosclerosis of the aorta and its branch vessels. No aneurysm. No retroperitoneal mass or adenopathy. Reproductive: Negative Other: Large amount of ascites freely distributed throughout the peritoneal cavity. Musculoskeletal: Chronic degenerative disc disease at L5-S1. Chronic bone changes of renal failure. IMPRESSION: Large amount of ascites, freely distributed. Cirrhosis of the liver without evidence of focal or acute finding. Cardiomegaly. Advanced aortic atherosclerosis and atherosclerosis of the branch vessels. Electronically Signed   By: Nelson Chimes M.D.   On: 11/16/2017 11:52   Ct Abdomen Pelvis W Contrast  Result Date: 10/25/2017 CLINICAL DATA:  Acute abdominal pain, generalized. EXAM: CT ABDOMEN AND PELVIS WITH CONTRAST TECHNIQUE: Multidetector CT imaging of the abdomen and pelvis was performed using the standard protocol following bolus administration of intravenous contrast. CONTRAST:  71mL ISOVUE-300 IOPAMIDOL (ISOVUE-300) INJECTION 61% COMPARISON:  09/28/2017 FINDINGS: Lower chest:  Cardiomegaly. Central line with tip in the right atrium. Overall small pericardial effusion, stable. Extensive atherosclerotic calcification of the coronaries and aorta. Hepatobiliary: Lobulated liver surface and large caudate lobe suggesting cirrhosis. No focal mass. Patent portal vein.No evidence of biliary obstruction or stone. Pancreas: Stable Spleen: Unremarkable. Adrenals/Urinary Tract: Negative adrenals. Marked bilateral renal atrophy with small cystic densities. Patient has history of end-stage renal disease. Unremarkable bladder. Stomach/Bowel: No obstruction. No visible inflammation. There is  a clip noted within the rectum. Vascular/Lymphatic: No acute vascular abnormality. Severe atherosclerosis with confluent calcification along the aorta and visceral branches. No mass or adenopathy. Reproductive:No pathologic findings. Other: There is a large volume ascites without visible loculation. Chronic peritoneal thickening in the pelvis, seen since at least 04/2017 Musculoskeletal: Generalized bony sclerosis, likely renal osteodystrophy. IMPRESSION: 1. No acute finding. 2. Large volume ascites. Chronic peritoneal thickening in the pelvis, please correlate with recent paracentesis results. 3. Suspected cirrhosis. 4.  Aortic Atherosclerosis (ICD10-I70.0).  Coronary atherosclerosis. Electronically Signed   By: Monte Fantasia M.D.   On: 10/25/2017 09:49   Dg Chest Port 1 View  Result Date: 11/08/2017 CLINICAL DATA:  Shortness of breath.  Smoker EXAM: PORTABLE CHEST 1 VIEW COMPARISON:  11/04/2017 FINDINGS: Decreased inspiration. Stable enlarged cardiac silhouette. The right jugular double-lumen catheter tips remain in the right atrium, currently in the mid to lower right atrium. Stable prominence of the interstitial markings and mild prominence of the pulmonary vasculature. Minimal left basilar linear atelectasis and possible small left pleural effusion. Diffuse osteopenia. Atheromatous arterial calcifications.  IMPRESSION: 1. Decreased inspiration with stable mild elevation of the left hemidiaphragm and interval minimal left basilar linear atelectasis or scarring and possible small left pleural effusion. 2. Stable cardiomegaly, mild pulmonary vascular congestion and mild chronic interstitial lung disease. Electronically Signed   By: Claudie Revering M.D.   On: 11/08/2017 15:38   Dg Chest Port 1 View  Result Date: 11/04/2017 CLINICAL DATA:  Productive cough with generalized weakness and dyspnea. EXAM: PORTABLE CHEST 1 VIEW COMPARISON:  10/29/2017 FINDINGS: Stable cardiomegaly with aortic atherosclerosis. Mild interstitial edema. No pulmonary consolidation. Left basilar atelectasis is identified with slight elevation of the left hemidiaphragm, chronic in appearance. Right IJ dialysis catheter tip remains in the mid right atrium. No acute osseous abnormality. IMPRESSION: Cardiomegaly with mild interstitial edema. Aortic atherosclerosis. Dialysis catheter tip in the mid right atrium. Electronically Signed   By: Ashley Royalty M.D.   On: 11/04/2017 03:23   Dg Chest Port 1 View  Result Date: 10/29/2017 CLINICAL DATA:  Chest pain and shortness of breath for 1 day. EXAM: PORTABLE CHEST 1 VIEW COMPARISON:  10/27/2017. FINDINGS: Stable enlarged cardiac silhouette. The right jugular catheter tip is in the right atrium. The left hemidiaphragm remains elevated. Clear lungs with normal vascularity. IMPRESSION: 1. The right jugular catheter tip remains in the mid right atrium. 2. Stable cardiomegaly and elevation of the left hemidiaphragm. Electronically Signed   By: Claudie Revering M.D.   On: 10/29/2017 11:31   Ir Abdomen US Limited  Result Date: 11/09/2017 CLINICAL DATA:  History of cirrhosis with recurrent symptomatic ascites. Please perform ultrasound-guided paracentesis for therapeutic purposes. EXAM: LIMITED ABDOMEN ULTRASOUND FOR ASCITES TECHNIQUE: Limited ultrasound survey for ascites was performed in all four abdominal  quadrants. COMPARISON:  Ascites search ultrasound-11/04/2017; ultrasound-guided paracentesis-10/28/2017; CT abdomen and pelvis - 10/25/2017 FINDINGS: Sonographic evaluation of the abdomen demonstrates a small amount of predominantly perihepatic intra-abdominal ascites. While the amount of fluid is likely amenable to ultrasound-guided paracentesis, the patient refused the procedure and as such, no procedure was performed. IMPRESSION: Small amount of intra-abdominal ascites, potentially amenable to ultrasound-guided paracentesis however the present time, the patient refuses to undergo the paracentesis Referring physician was made aware of patient's request. Electronically Signed   By: Sandi Mariscal M.D.   On: 11/09/2017 10:20   Ir Abdomen US Limited  Result Date: 11/04/2017 CLINICAL DATA:  Abdominal pain, recurrent abdominal ascites. Paracentesis requested. EXAM: LIMITED ABDOMEN ULTRASOUND FOR  ASCITES TECHNIQUE: Limited ultrasound survey for ascites was performed in all four abdominal quadrants. COMPARISON:  10/28/2017 FINDINGS: Survey ultrasound demonstrates a small amount of scattered abdominal ascites. No large pocket to allow safe therapeutic paracentesis. IMPRESSION: Small amount of scattered abdominal ascites. Therapeutic paracentesis deferred. Electronically Signed   By: Lucrezia Europe M.D.   On: 11/04/2017 09:44   Ir Paracentesis  Result Date: 11/09/2017 INDICATION: Recurrent symptomatic ascites. Please perform ultrasound-guided paracentesis for diagnostic and therapeutic purposes. EXAM: ULTRASOUND-GUIDED PARACENTESIS COMPARISON:  Ultrasound-guided paracentesis - 10/28/2017 CT abdomen and pelvis - 10/25/2017 MEDICATIONS: None. COMPLICATIONS: None immediate. TECHNIQUE: Informed written consent was obtained from the patient after a discussion of the risks, benefits and alternatives to treatment. A timeout was performed prior to the initiation of the procedure. Initial ultrasound scanning demonstrates a small to  moderate amount of ascites within the right lower abdominal quadrant. The right lower abdomen was prepped and draped in the usual sterile fashion. 1% lidocaine with epinephrine was used for local anesthesia. Under direct ultrasound guidance, a 19 gauge, 7-cm, Yueh catheter was introduced. An ultrasound image was saved for documentation purposed. The paracentesis was performed. The catheter was removed and a dressing was applied. The patient tolerated the procedure well without immediate post procedural complication. FINDINGS: A total of approximately 3 liters of serous fluid was removed. Samples were sent to the laboratory as requested by the clinical team. IMPRESSION: Successful ultrasound-guided paracentesis yielding 3 liters of peritoneal fluid. Electronically Signed   By: Sandi Mariscal M.D.   On: 11/09/2017 15:43   Ir Paracentesis  Result Date: 10/28/2017 INDICATION: Patient with recurrent ascites. Request is made for diagnostic and therapeutic paracentesis. EXAM: ULTRASOUND GUIDED DIAGNOSTIC AND THERAPEUTIC PARACENTESIS MEDICATIONS: 10 mL 2% lidocaine COMPLICATIONS: None immediate. PROCEDURE: Informed written consent was obtained from the patient after a discussion of the risks, benefits and alternatives to treatment. A timeout was performed prior to the initiation of the procedure. Initial ultrasound scanning demonstrates a small amount of ascites within the left lateral abdomen. The left lateral abdomen was prepped and draped in the usual sterile fashion. 2% lidocaine was used for local anesthesia. Following this, a 19 gauge, 7-cm, Yueh catheter was introduced. An ultrasound image was saved for documentation purposes. The paracentesis was performed. The catheter was removed and a dressing was applied. The patient tolerated the procedure well without immediate post procedural complication. FINDINGS: A total of approximately 5 mL of amber fluid was removed. Samples were sent to the laboratory as requested by  the clinical team. IMPRESSION: Successful ultrasound-guided diagnostic and therapeutic paracentesis yielding 5 mL liters of peritoneal fluid. Read by: Brynda Greathouse PA-C Electronically Signed   By: Sandi Mariscal M.D.   On: 10/28/2017 12:07     CBC Recent Labs  Lab 11/12/17 0737 11/12/17 2155 11/15/17 0919  WBC 15.1* 13.0* 11.4*  HGB 8.8* 8.8* 9.7*  HCT 26.2* 26.0* 29.0*  PLT 215 198 226  MCV 74.2* 74.3* 74.9*  MCH 24.9* 25.1* 25.1*  MCHC 33.6 33.8 33.4  RDW 21.4* 21.7* 21.8*    Chemistries  Recent Labs  Lab 11/10/17 1025 11/12/17 0737 11/12/17 2155 11/15/17 0919  NA 140 127* 130* 133*  K 3.6 3.7 5.1 4.6  CL 102 83* 93* 93*  CO2 21* 27 25 26   GLUCOSE 149* 124* 137* 131*  BUN 53* 57* 32* 37*  CREATININE 5.59* 6.29* 4.53* 5.15*  CALCIUM 6.3* 9.5 9.0 8.5*  AST  --   --  40 26  ALT  --   --  27 20  ALKPHOS  --   --  215* 190*  BILITOT  --   --  0.8 1.1   ------------------------------------------------------------------------------------------------------------------ No results for input(s): CHOL, HDL, LDLCALC, TRIG, CHOLHDL, LDLDIRECT in the last 72 hours.  Lab Results  Component Value Date   HGBA1C 4.6 (L) 04/24/2017   ------------------------------------------------------------------------------------------------------------------ No results for input(s): TSH, T4TOTAL, T3FREE, THYROIDAB in the last 72 hours.  Invalid input(s): FREET3 ------------------------------------------------------------------------------------------------------------------ No results for input(s): VITAMINB12, FOLATE, FERRITIN, TIBC, IRON, RETICCTPCT in the last 72 hours.  Coagulation profile Recent Labs  Lab 11/15/17 1841  INR 1.21    No results for input(s): DDIMER in the last 72 hours.  Cardiac Enzymes No results for input(s): CKMB, TROPONINI, MYOGLOBIN in the last 168 hours.  Invalid input(s):  CK ------------------------------------------------------------------------------------------------------------------    Component Value Date/Time   BNP 943.6 (H) 08/29/2017 1235     Joylyn Duggin M.D on 11/16/2017 at 3:22 PM  Between 7am to 7pm - Pager - [phone]  After 7pm go to [email or web address] - password [email or web address]  [facility] -  Office  [phone]   Voice Recognition Viviann Spare dictation system was used to create this note, attempts have been made to correct errors. Please contact the author with questions and/or clarifications.

## 2017-11-16 NOTE — Consult Note (Addendum)
[facility] Renal Consultation Note    Indication for Consultation:  Management of ESRD/hemodialysis; anemia, hypertension/volume and secondary hyperparathyroidism PCP: [name], MD   HPI: [name] is a 57 y.o. male with ESRD on hemodialysis MWF at [facility]. PMH of HTN, Hep B,Hep C, Etoh abuse, alcoholic cirrhosis, COPD, GERD, AOCD, polysubstance abuse. Recent admission for spontaneous bacterial peritonitis 04/26-05/04/19.  Body fluid cultures 11/09/2017 NGTD 5 days. He left AMA 11/12/2017 prior to receiving DC instructions. He was supposed to have been receiving Diflucan and [place]. Last HD in-center was 11/14/2017 at which he received [name] 2 grams per review on med hx at [place], no Rx for diflucan has been filled.   He presented to ED yesterday with complains of abdominal pain, says this has been on-going issue for three weeks. Says he thinks "It's the infection". WBC 11.1 Lactic acid 0.98 initially now 1.3 today. CXR shows mild vascular congestion. Seen in ED-abdomin rigid, no rebound tenderness. Says he is having trouble taking deep breathes. He says he can't eat but is asking for food. He has been started on ceftazidime and fluconazole per primary and is being admitted for SBP.Marland Kitchen   Past Medical History:  Diagnosis Date  . Adenomatous colon polyp    tubular  . Anemia   . Anxiety   . Arthritis    left shoulder  . Atherosclerosis of aorta ([place])   . Cardiomegaly   . Chest pain    DATE UNKNOWN, C/O PERIODICALLY  . Cocaine abuse ([place])   . COPD exacerbation ([place]) 08/17/2016  . Coronary artery disease    stent 02/22/17  . Dialysis patient ([place])    Monday-Wednesday-Friday  . ESRD (end stage renal disease) on dialysis ([place])    "[name]; MWF" (07/04/2017)  . GERD (gastroesophageal reflux disease)    DATE UNKNOWN  . Hemorrhoids   . Hepatitis B, chronic ([place])   . Hepatitis C   . History of kidney stones   . Hyperkalemia    . Hypertension   . Metabolic bone disease    Patient denies  . Mitral stenosis   . Myocardial infarction ([place])   . Pneumonia   . Pulmonary edema   . Renal disorder   . Renal insufficiency   . Shortness of breath dyspnea    " for the last past year with this dialysis"  . Solitary rectal ulcer syndrome   . Tubular adenoma of colon    Past Surgical History:  Procedure Laterality Date  . A/V FISTULAGRAM Left 05/26/2017   Procedure: A/V FISTULAGRAM;  Surgeon: Conrad Lake Winola, MD;  Location: [place] [facility];  Service: Cardiovascular;  Laterality: Left;  . APPLICATION OF WOUND VAC Left 06/14/2017   Procedure: APPLICATION OF WOUND VAC;  Surgeon: Katha Cabal, MD;  Location: [facility];  Service: Vascular;  Laterality: Left;  . AV FISTULA PLACEMENT  2012   BELIEVED WAS PLACED IN JUNE  . AV FISTULA PLACEMENT Right 08/09/2017   Procedure: Creation Right arm ARTERIOVENOUS BRACHIOCEPOHALIC FISTULA;  Surgeon: Elam Dutch, MD;  Location: [place];  Service: Vascular;  Laterality: Right;  . COLONOSCOPY    . CORONARY STENT INTERVENTION N/A 02/22/2017   Procedure: CORONARY STENT INTERVENTION;  Surgeon: Nigel Mormon, MD;  Location: [place] [facility];  Service: Cardiovascular;  Laterality: N/A;  . FLEXIBLE SIGMOIDOSCOPY N/A 07/15/2017   Procedure: FLEXIBLE SIGMOIDOSCOPY;  Surgeon: Carol Ada, MD;  Location: [place];  Service: Endoscopy;  Laterality: N/A;  .  HEMORRHOID BANDING    . I&D EXTREMITY Left 06/01/2017   Procedure: IRRIGATION AND DEBRIDEMENT LEFT ARM HEMATOMA WITH LIGATION OF LEFT ARM AV FISTULA;  Surgeon: Elam Dutch, MD;  Location: [place];  Service: Vascular;  Laterality: Left;  . I&D EXTREMITY Left 06/14/2017   Procedure: IRRIGATION AND DEBRIDEMENT EXTREMITY;  Surgeon: Katha Cabal, MD;  Location: [facility];  Service: Vascular;  Laterality: Left;  . INSERTION OF DIALYSIS CATHETER  05/30/2017  . INSERTION OF DIALYSIS CATHETER N/A 05/30/2017   Procedure:  INSERTION OF DIALYSIS CATHETER;  Surgeon: Elam Dutch, MD;  Location: [place];  Service: Vascular;  Laterality: N/A;  . IR PARACENTESIS  08/30/2017  . IR PARACENTESIS  09/29/2017  . IR PARACENTESIS  10/28/2017  . IR PARACENTESIS  11/09/2017  . LEFT HEART CATH AND CORONARY ANGIOGRAPHY N/A 02/22/2017   Procedure: LEFT HEART CATH AND CORONARY ANGIOGRAPHY;  Surgeon: Nigel Mormon, MD;  Location: [place] [facility];  Service: Cardiovascular;  Laterality: N/A;  . LIGATION OF ARTERIOVENOUS  FISTULA Left 08/12/2246   Procedure: Plication of Left Arm Arteriovenous Fistula;  Surgeon: Elam Dutch, MD;  Location: [place];  Service: Vascular;  Laterality: Left;  . POLYPECTOMY    . REVISON OF ARTERIOVENOUS FISTULA Left 2/50/0370   Procedure: PLICATION OF DISTAL ANEURYSMAL SEGEMENT OF LEFT UPPER ARM ARTERIOVENOUS FISTULA;  Surgeon: Elam Dutch, MD;  Location: [place];  Service: Vascular;  Laterality: Left;  . REVISON OF ARTERIOVENOUS FISTULA Left 4/88/8916   Procedure: Plication of Left Upper Arm Fistula ;  Surgeon: Waynetta Sandy, MD;  Location: [place];  Service: Vascular;  Laterality: Left;  . SKIN GRAFT SPLIT THICKNESS LEG / FOOT Left    SKIN GRAFT SPLIT THICKNESS LEFT ARM DONOR SITE: LEFT ANTERIOR THIGH  . SKIN SPLIT GRAFT Left 07/04/2017   Procedure: SKIN GRAFT SPLIT THICKNESS LEFT ARM DONOR SITE: LEFT ANTERIOR THIGH;  Surgeon: Elam Dutch, MD;  Location: [place];  Service: Vascular;  Laterality: Left;  . THROMBECTOMY W/ EMBOLECTOMY Left 06/05/2017   Procedure: EXPLORATION OF LEFT ARM FOR BLEEDING; OVERSEWED PROXIMAL FISTULA;  Surgeon: Angelia Mould, MD;  Location: [place];  Service: Vascular;  Laterality: Left;  . WOUND EXPLORATION Left 06/03/2017   Procedure: WOUND EXPLORATION WITH WOUND VAC APPLICATION TO LEFT ARM;  Surgeon: Angelia Mould, MD;  Location: [facility];  Service: Vascular;  Laterality: Left;   Family History  Problem Relation Age of Onset  . Heart  disease Mother   . Lung cancer Mother   . Heart disease Father   . Malignant hyperthermia Father   . COPD Father   . Throat cancer Sister   . Esophageal cancer Sister   . Hypertension Other   . COPD Other   . Colon cancer Neg Hx   . Colon polyps Neg Hx   . Rectal cancer Neg Hx   . Stomach cancer Neg Hx    Social History:  reports that he has been smoking cigarettes.  He started smoking about 44 years ago. He has a 21.50 pack-year smoking history. He has never used smokeless tobacco. He reports that he drank alcohol. He reports that he has current or past drug history. Allergies  Allergen Reactions  . Aspirin Other (See Comments)    STOMACH PAIN  . Tylenol [Acetaminophen]     Stomach ache  . Clonidine Derivatives Itching  . Tramadol Itching   Prior to Admission medications   Medication Sig Start Date End Date  Taking? Authorizing Provider  oxyCODONE (ROXICODONE) 5 MG immediate release tablet Take 1 tablet (5 mg total) by mouth every 4 (four) hours as needed for severe pain. 11/13/17  Yes Tanna Furry, MD  albuterol (VENTOLIN HFA) 108 (90 Base) MCG/ACT inhaler Inhale 2 puffs into the lungs every 6 (six) hours as needed for wheezing or shortness of breath.    [provider]  ALPRAZolam Duanne Moron) 0.25 MG tablet Take 1 tablet (0.25 mg total) by mouth 3 (three) times daily as needed for anxiety or sleep. 10/30/17   Eugenie Filler, MD  budesonide-formoterol [facility]) 80-4.5 MCG/ACT inhaler Inhale 2 puffs into the lungs 2 (two) times daily. 10/13/17   Tanda Rockers, MD  clopidogrel (PLAVIX) 75 MG tablet Take 1 tablet (75 mg total) by mouth daily. 07/15/17   Rhyne, Hulen Shouts, PA-C  diltiazem (CARDIZEM CD) 120 MG 24 hr capsule Take 1 capsule (120 mg total) by mouth daily. 06/23/17 06/23/18  Deatra James, MD  famotidine (PEPCID) 20 MG tablet One at bedtime Patient taking differently: Take 20 mg by mouth at bedtime.  09/08/17   Tanda Rockers, MD  fluticasone (FLONASE) 50 MCG/ACT  nasal spray Place 2 sprays into both nostrils daily. 10/31/17   Eugenie Filler, MD  gabapentin (NEURONTIN) 100 MG capsule Take 1 capsule (100 mg total) by mouth at bedtime. Patient taking differently: Take 100 mg by mouth 2 (two) times daily.  09/30/17   Mariel Aloe, MD  guaiFENesin (ROBITUSSIN) 100 MG/5ML SOLN Take 5 mLs (100 mg total) by mouth every 4 (four) hours as needed for cough or to loosen phlegm. Patient not taking: Reported on 11/13/2017 10/30/17   Eugenie Filler, MD  hydrALAZINE (APRESOLINE) 100 MG tablet Take 1 tablet by mouth twice a day take after HD on dialysis days 08/31/17   [provider]  isosorbide mononitrate (IMDUR) 30 MG 24 hr tablet Take 1 tablet (30 mg total) by mouth daily. Patient taking differently: Take 30 mg by mouth daily. ER 06/17/17   [name], MD  loratadine (CLARITIN) 10 MG tablet Take 1 tablet (10 mg total) by mouth daily. 10/31/17   Eugenie Filler, MD  nicotine (NICODERM CQ - DOSED IN MG/24 HOURS) 21 mg/24hr patch Place 21 mg onto the skin daily.    [provider]  Nutritional Supplements (FEEDING SUPPLEMENT, NEPRO CARB STEADY,) LIQD Take 237 mLs by mouth 2 (two) times daily between meals. 09/30/17   Mariel Aloe, MD  pantoprazole (PROTONIX) 40 MG tablet Take 1 tablet (40 mg total) by mouth daily. Take 30-60 min before first meal of the day 09/08/17   Tanda Rockers, MD  sevelamer carbonate (RENVELA) 800 MG tablet Take 1,600-3,200 mg by mouth See admin instructions. Take 4 tablets (800mg  each) three times daily and 2 tablets twice daily with a snack. 10/31/17   [provider]  tiotropium (SPIRIVA) 18 MCG inhalation capsule Place 18 mcg into inhaler and inhale daily.    [provider]   Current Facility-Administered Medications  Medication Dose Route Frequency Provider Last Rate Last Dose  . 0.9 %  sodium chloride infusion  100 mL Intravenous PRN Valentina Gu, NP      . 0.9 %  sodium chloride infusion   100 mL Intravenous PRN Valentina Gu, NP      . albuterol (PROVENTIL) (2.5 MG/3ML) 0.083% nebulizer solution 2.5 mg  2.5 mg Nebulization Q4H PRN Ivor Costa, MD      . alteplase (  CATHFLO ACTIVASE) injection 2 mg  2 mg Intracatheter Once PRN Valentina Gu, NP      . cefTAZidime (FORTAZ) 2 g in sodium chloride 0.9 % 100 mL IVPB  2 g Intravenous Q M,W,F-1800 Romona Curls, RPH      . clopidogrel (PLAVIX) tablet 75 mg  75 mg Oral Daily Ivor Costa, MD   75 mg at 11/15/17 2252  . diltiazem (CARDIZEM CD) 24 hr capsule 120 mg  120 mg Oral Daily Ivor Costa, MD   120 mg at 11/15/17 2239  . feeding supplement (NEPRO CARB STEADY) liquid 237 mL  237 mL Oral BID BM Ivor Costa, MD      . fluconazole (DIFLUCAN) tablet 200 mg  200 mg Oral Daily Romona Curls, RPH   200 mg at 11/15/17 2251  . fluticasone (FLONASE) 50 MCG/ACT nasal spray 2 spray  2 spray Each Nare Daily Ivor Costa, MD      . gabapentin (NEURONTIN) capsule 100 mg  100 mg Oral BID Ivor Costa, MD   100 mg at 11/15/17 2251  . guaiFENesin (ROBITUSSIN) 100 MG/5ML solution 100 mg  5 mL Oral Q4H PRN Ivor Costa, MD      . heparin injection 1,000 Units  1,000 Units Dialysis PRN Valentina Gu, NP      . heparin injection 5,000 Units  5,000 Units Subcutaneous Q8H Ivor Costa, MD      . hydrALAZINE (APRESOLINE) injection 5 mg  5 mg Intravenous Q2H PRN Ivor Costa, MD   5 mg at 11/16/17 0125  . hydrALAZINE (APRESOLINE) tablet 10 mg  10 mg Oral BID Ivor Costa, MD   10 mg at 11/15/17 2323  . hydrOXYzine (ATARAX/VISTARIL) tablet 10 mg  10 mg Oral TID PRN Ivor Costa, MD   10 mg at 11/15/17 2240  . isosorbide mononitrate (IMDUR) 24 hr tablet 30 mg  30 mg Oral Daily Ivor Costa, MD   30 mg at 11/15/17 2252  . lactulose (CHRONULAC) 10 GM/15ML solution 20 g  20 g Oral BID Ivor Costa, MD   Stopped at 11/15/17 2324  . lidocaine (PF) (XYLOCAINE) 1 % injection 5 mL  5 mL Intradermal PRN Valentina Gu, NP      . lidocaine-prilocaine (EMLA) cream 1  application  1 application Topical PRN Valentina Gu, NP      . loratadine (CLARITIN) tablet 10 mg  10 mg Oral Daily Ivor Costa, MD      . mometasone-formoterol (DULERA) 100-5 MCG/ACT inhaler 2 puff  2 puff Inhalation BID Ivor Costa, MD      . nicotine (NICODERM CQ - dosed in mg/24 hours) patch 21 mg  21 mg Transdermal Daily Ivor Costa, MD      . oxyCODONE (Oxy IR/ROXICODONE) immediate release tablet 5 mg  5 mg Oral Q4H PRN Ivor Costa, MD   5 mg at 11/16/17 0502  . pantoprazole (PROTONIX) EC tablet 40 mg  40 mg Oral Daily Ivor Costa, MD   40 mg at 11/15/17 2253  . pentafluoroprop-tetrafluoroeth (GEBAUERS) aerosol 1 application  1 application Topical PRN Valentina Gu, NP      . sevelamer carbonate (RENVELA) tablet 1,600 mg  1,600 mg Oral BID BM PRN Emokpae, Courage, MD      . sevelamer carbonate (RENVELA) tablet 3,200 mg  3,200 mg Oral TID WC Emokpae, Courage, MD      . tiotropium (SPIRIVA) inhalation capsule 18 mcg  18 mcg Inhalation Daily Ivor Costa, MD      .  zolpidem (AMBIEN) tablet 5 mg  5 mg Oral QHS PRN Ivor Costa, MD   5 mg at 11/15/17 2253   Current Outpatient Medications  Medication Sig Dispense Refill  . oxyCODONE (ROXICODONE) 5 MG immediate release tablet Take 1 tablet (5 mg total) by mouth every 4 (four) hours as needed for severe pain. 10 tablet 0  . albuterol (VENTOLIN HFA) 108 (90 Base) MCG/ACT inhaler Inhale 2 puffs into the lungs every 6 (six) hours as needed for wheezing or shortness of breath.    . ALPRAZolam (XANAX) 0.25 MG tablet Take 1 tablet (0.25 mg total) by mouth 3 (three) times daily as needed for anxiety or sleep. 15 tablet 0  . budesonide-formoterol (SYMBICORT) 80-4.5 MCG/ACT inhaler Inhale 2 puffs into the lungs 2 (two) times daily. 1 Inhaler 1  . clopidogrel (PLAVIX) 75 MG tablet Take 1 tablet (75 mg total) by mouth daily. 30 tablet 3  . diltiazem (CARDIZEM CD) 120 MG 24 hr capsule Take 1 capsule (120 mg total) by mouth daily. 30 capsule 1  .  famotidine (PEPCID) 20 MG tablet One at bedtime (Patient taking differently: Take 20 mg by mouth at bedtime. ) 30 tablet 2  . fluticasone (FLONASE) 50 MCG/ACT nasal spray Place 2 sprays into both nostrils daily. 16 g 0  . gabapentin (NEURONTIN) 100 MG capsule Take 1 capsule (100 mg total) by mouth at bedtime. (Patient taking differently: Take 100 mg by mouth 2 (two) times daily. )    . guaiFENesin (ROBITUSSIN) 100 MG/5ML SOLN Take 5 mLs (100 mg total) by mouth every 4 (four) hours as needed for cough or to loosen phlegm. (Patient not taking: Reported on 11/13/2017) 236 mL 0  . hydrALAZINE (APRESOLINE) 100 MG tablet Take 1 tablet by mouth twice a day take after HD on dialysis days  11  . isosorbide mononitrate (IMDUR) 30 MG 24 hr tablet Take 1 tablet (30 mg total) by mouth daily. (Patient taking differently: Take 30 mg by mouth daily. ER) 30 tablet 1  . loratadine (CLARITIN) 10 MG tablet Take 1 tablet (10 mg total) by mouth daily. 30 tablet 0  . nicotine (NICODERM CQ - DOSED IN MG/24 HOURS) 21 mg/24hr patch Place 21 mg onto the skin daily.    . Nutritional Supplements (FEEDING SUPPLEMENT, NEPRO CARB STEADY,) LIQD Take 237 mLs by mouth 2 (two) times daily between meals. 60 Can 0  . pantoprazole (PROTONIX) 40 MG tablet Take 1 tablet (40 mg total) by mouth daily. Take 30-60 min before first meal of the day 30 tablet 2  . sevelamer carbonate (RENVELA) 800 MG tablet Take 1,600-3,200 mg by mouth See admin instructions. Take 4 tablets (800mg  each) three times daily and 2 tablets twice daily with a snack.  6  . tiotropium (SPIRIVA) 18 MCG inhalation capsule Place 18 mcg into inhaler and inhale daily.     Labs: Basic Metabolic Panel: Recent Labs  Lab 11/10/17 1025 11/12/17 0737 11/12/17 2155 11/15/17 0919  NA 140 127* 130* 133*  K 3.6 3.7 5.1 4.6  CL 102 83* 93* 93*  CO2 21* 27 25 26   GLUCOSE 149* 124* 137* 131*  BUN 53* 57* 32* 37*  CREATININE 5.59* 6.29* 4.53* 5.15*  CALCIUM 6.3* 9.5 9.0 8.5*   PHOS 3.2 2.7  --   --    Liver Function Tests: Recent Labs  Lab 11/12/17 0737 11/12/17 2155 11/15/17 0919  AST  --  40 26  ALT  --  27 20  ALKPHOS  --  215* 190*  BILITOT  --  0.8 1.1  PROT  --  6.7 6.8  ALBUMIN 3.1* 3.0* 2.9*   Recent Labs  Lab 11/12/17 2155 11/15/17 0919  LIPASE 26 24   Recent Labs  Lab 11/15/17 1918  AMMONIA 40*   CBC: Recent Labs  Lab 11/12/17 0737 11/12/17 2155 11/15/17 0919  WBC 15.1* 13.0* 11.4*  HGB 8.8* 8.8* 9.7*  HCT 26.2* 26.0* 29.0*  MCV 74.2* 74.3* 74.9*  PLT 215 198 226   Cardiac Enzymes: No results for input(s): CKTOTAL, CKMB, CKMBINDEX, TROPONINI in the last 168 hours. CBG: No results for input(s): GLUCAP in the last 168 hours. Iron Studies: No results for input(s): IRON, TIBC, TRANSFERRIN, FERRITIN in the last 72 hours. Studies/Results: Dg Chest 2 View  Result Date: 11/15/2017 CLINICAL DATA:  Abdominal distention with productive cough. EXAM: CHEST - 2 VIEW COMPARISON:  None. FINDINGS: The heart is enlarged. Dialysis catheter tips project over the RIGHT atrium. Mild vascular congestion without consolidation or edema. No effusion or pneumothorax. No acute osseous findings. IMPRESSION: Cardiomegaly. Mild vascular congestion. No frank edema or consolidation. Electronically Signed   By: Staci Righter M.D.   On: 11/15/2017 19:37   Ct Abdomen Pelvis W Contrast  Result Date: 11/16/2017 CLINICAL DATA:  Chronic periumbilical abdominal pain. Loss of appetite. Dialysis patient. EXAM: CT ABDOMEN AND PELVIS WITH CONTRAST TECHNIQUE: Multidetector CT imaging of the abdomen and pelvis was performed using the standard protocol following bolus administration of intravenous contrast. CONTRAST:  60mL OMNIPAQUE IOHEXOL 300 MG/ML  SOLN COMPARISON:  10/25/2017 FINDINGS: Lower chest: Cardiomegaly.  Lung bases are clear.  No pleural fluid. Hepatobiliary: Cirrhosis as manifest by lobulation of the liver surface and enlargement of the left lobe and caudate  lobe. No definable focal mass. No calcified gallstones. Portal vein shows flow. Pancreas: Normal Spleen: Normal size.  No focal lesion. Adrenals/Urinary Tract: No adrenal mass. Both kidneys are small with extensive vascular calcification and a 1 cm cyst at the lower pole on the left. Stomach/Bowel: No bowel pathology discernible. No evidence of obstruction. Clip in the region of the rectum as seen previously as distant as 09/02/2017 Vascular/Lymphatic: Advanced atherosclerosis of the aorta and its branch vessels. No aneurysm. No retroperitoneal mass or adenopathy. Reproductive: Negative Other: Large amount of ascites freely distributed throughout the peritoneal cavity. Musculoskeletal: Chronic degenerative disc disease at L5-S1. Chronic bone changes of renal failure. IMPRESSION: Large amount of ascites, freely distributed. Cirrhosis of the liver without evidence of focal or acute finding. Cardiomegaly. Advanced aortic atherosclerosis and atherosclerosis of the branch vessels. Electronically Signed   By: Nelson Chimes M.D.   On: 11/16/2017 11:52    ROS: As per HPI otherwise negative.  Physical Exam: Vitals:   11/16/17 0230 11/16/17 0300 11/16/17 0430 11/16/17 0700  BP: (!) 175/87 (!) 174/74 (!) 171/84   Pulse: 94 88 93   Resp: 16 11 14 18   Temp:      TempSrc:      SpO2: 98% 96% 97%   Weight:      Height:         General: Chronically ill appearing AA male in NAD Head: Normocephalic, atraumatic, sclera non-icteric, mucus membranes are moist Neck: Supple. JVD not elevated. Lungs: Clear bilaterally to auscultation without wheezes, rales, or rhonchi. Breath sounds decreased in bases, mild WOB on exertion.  Heart: RRR with S1 S2. No murmurs, rubs, or gallops appreciated. Abdomen: Abdomin rigid, diffusely tender, no rebound tenderness. Active BS Lower extremities:without edema or ischemic changes,  no open wounds  Neuro: Alert and oriented X 3. Moves all extremities spontaneously. Psych:  Responds to  questions appropriately with a normal affect. Dialysis Access: RIJ TDC     MonWedFri, 4 hrs 0 min, 180NRe Optiflux, BFR 400, DFR Manual 800 mL/min, EDW 69 (kg), Dialysate 2.0 K, 2.0 Ca, 1.0 Mg, 100 Dextrose (G2201), Sodium 137 (mEq/L), Bicarb Setting: 35 (mEq/L), UFR Profile: None, Sodium Model: None, Access: Catheter-Tunneled, [facility] 3206- Entered By [employer]...   Dialysis Orders: [place] MWF 4 hours 180 NRe 400/800 69 kg 2.0 K/2.0 Ca  -No heparin -Parsabiv 5mg  IV TIW -Mircera 225 mcg IV q 2 weeks (last dose 10/26/2017 Last HGB 9.1 11/14/17) -Calcitriol 0.5 mcg PO TIW  Assessment/Plan: 1.  SBP vs Ascites: Per primary. CT of abdomen revealed cirrhosis and large ascites and feels a little better after paracentesis.  Rigidity was likely just tense ascites as his abdomen was still tender but soft.  On fluconazole and ceftazidime per primary. Afebrile since adm.  2.  ESRD -  MWF HD today on schedule. K+ 4.6 No heparin  3.  Hypertension/volume  -Hypertensive at present however also in pain. On dilt and hydralazine. No evidence of volume overload by exam but appears to have lost wt. Mild vascular congestion on CXR 11/15/17. Attempt 3-3.5 liters in HD today.  4.  Anemia  -HGB 9.7. No recent ESA for OP Center. Give Aranesp 40 mcg IV today. Follow HGB.  5.  Metabolic bone disease - Add renal function panel to today's labs. Continue binders, VDRA. Parsabiv not available on hospital formulary  6.  Nutrition -NPO at present. Albumin 2.9 7.  Cirrhosis: ammonia level 40. Appears to have ascites-may need paracentesis. Per primary 8. H/O polysubstance abuse 9. H/O medical noncompliance.    Rita H. Owens Shark, NP-C 11/16/2017, 9:30 AM  [employer] [phone]  I have seen and examined this patient and agree with plan and assessment in the above note with renal recommendations/intervention highlighted.  Plan for HD today and will follow his response, although very difficult to remove  ascites especially with noncompliance with HD and medications.  Governor [place] Lizzette Carbonell,MD 11/16/2017 4:47 PM

## 2017-11-16 NOTE — Procedures (Signed)
   US guided LLQ paracentesis  2.1 L yellow fluid Sent for labs per MD  tolerated well

## 2017-11-16 NOTE — Progress Notes (Signed)
Pt able to verify his identity via name and dob but refuses to wear hospital identification bracelet with barcodes as he states it irritates his skin. Patient educated regarding safety indications of bracelet, patient still refuses.

## 2017-11-17 ENCOUNTER — Other Ambulatory Visit: Payer: Self-pay

## 2017-11-17 LAB — RENAL FUNCTION PANEL
Albumin: 2.8 g/dL — ABNORMAL LOW (ref 3.5–5.0)
Anion gap: 15 (ref 5–15)
BUN: 58 mg/dL — ABNORMAL HIGH (ref 6–20)
CO2: 25 mmol/L (ref 22–32)
Calcium: 8 mg/dL — ABNORMAL LOW (ref 8.9–10.3)
Chloride: 91 mmol/L — ABNORMAL LOW (ref 101–111)
Creatinine, Ser: 7.79 mg/dL — ABNORMAL HIGH (ref 0.61–1.24)
GFR calc Af Amer: 8 mL/min — ABNORMAL LOW (ref >60)
GFR calc non Af Amer: 7 mL/min — ABNORMAL LOW (ref >60)
Glucose, Bld: 133 mg/dL — ABNORMAL HIGH (ref 65–99)
Phosphorus: 5 mg/dL — ABNORMAL HIGH (ref 2.5–4.6)
Potassium: 4.8 mmol/L (ref 3.5–5.1)
Sodium: 131 mmol/L — ABNORMAL LOW (ref 135–145)

## 2017-11-17 LAB — CBC
HCT: 24.4 % — ABNORMAL LOW (ref 39.0–52.0)
Hemoglobin: 8.2 g/dL — ABNORMAL LOW (ref 13.0–17.0)
MCH: 24.8 pg — ABNORMAL LOW (ref 26.0–34.0)
MCHC: 33.6 g/dL (ref 30.0–36.0)
MCV: 73.7 fL — ABNORMAL LOW (ref 78.0–100.0)
Platelets: 151 10*3/uL (ref 150–400)
RBC: 3.31 MIL/uL — ABNORMAL LOW (ref 4.22–5.81)
RDW: 21.3 % — ABNORMAL HIGH (ref 11.5–15.5)
WBC: 10.2 10*3/uL (ref 4.0–10.5)

## 2017-11-17 LAB — PATHOLOGIST SMEAR REVIEW

## 2017-11-17 LAB — MRSA PCR SCREENING: MRSA by PCR: NEGATIVE

## 2017-11-17 MED ORDER — CEFTAZIDIME 2 G IJ SOLR
2.0000 g | Freq: Once | INTRAMUSCULAR | Status: AC
  Administered 2017-11-17: 2 g via INTRAVENOUS
  Filled 2017-11-17: qty 2

## 2017-11-17 MED ORDER — BISACODYL 10 MG RE SUPP: 10.0000 mg | Freq: Once | RECTAL | Status: DC

## 2017-11-17 MED ORDER — LACTULOSE 10 GM/15ML PO SOLN
60.0000 g | Freq: Once | ORAL | Status: AC
  Administered 2017-11-17: 60 g via ORAL
  Filled 2017-11-17: qty 90

## 2017-11-17 NOTE — Procedures (Signed)
I was present at this dialysis session. I have reviewed the session itself and made appropriate changes.   Filed Weights   11/16/17 2000 11/17/17 0600 11/17/17 0740  Weight: 76.6 kg (168 lb 14.4 oz) 74.5 kg (164 lb 3.9 oz) 73.9 kg (162 lb 14.7 oz)    Recent Labs  Lab 11/17/17 0756  NA 131*  K 4.8  CL 91*  CO2 25  GLUCOSE 133*  BUN 58*  CREATININE 7.79*  CALCIUM 8.0*  PHOS 5.0*    Recent Labs  Lab 11/12/17 2155 11/15/17 0919 11/17/17 0757  WBC 13.0* 11.4* 10.2  HGB 8.8* 9.7* 8.2*  HCT 26.0* 29.0* 24.4*  MCV 74.3* 74.9* 73.7*  PLT 198 226 151    Scheduled Meds: . clopidogrel  75 mg Oral Daily  . darbepoetin (ARANESP) injection - DIALYSIS  40 mcg Intravenous Q Wed-HD  . diltiazem  120 mg Oral Daily  . feeding supplement (NEPRO CARB STEADY)  237 mL Oral BID BM  . fluconazole  200 mg Oral Daily  . fluticasone  2 spray Each Nare Daily  . gabapentin  100 mg Oral BID  . heparin injection (subcutaneous)  5,000 Units Subcutaneous Q8H  . hydrALAZINE  10 mg Oral BID  . isosorbide mononitrate  30 mg Oral Daily  . lactulose  20 g Oral BID  . loratadine  10 mg Oral Daily  . mometasone-formoterol  2 puff Inhalation BID  . nicotine  21 mg Transdermal Daily  . pantoprazole  40 mg Oral Daily  . sevelamer carbonate  3,200 mg Oral TID WC  . tiotropium  18 mcg Inhalation Daily   Continuous Infusions: . sodium chloride    . sodium chloride    . cefTAZidime (FORTAZ)  IV 2 g (11/16/17 1809)  . cefTAZidime (FORTAZ)  IV     PRN Meds:.sodium chloride, sodium chloride, albuterol, ALPRAZolam, alteplase, guaiFENesin, heparin, hydrALAZINE, hydrOXYzine, lidocaine (PF), lidocaine-prilocaine, morphine injection, ondansetron (ZOFRAN) IV, oxyCODONE, pentafluoroprop-tetrafluoroeth, sevelamer carbonate, zolpidem   Donetta Potts,  MD 11/17/2017, 9:37 AM

## 2017-11-17 NOTE — Progress Notes (Signed)
Spoke with Hemo-RN, pt will receive scheduled fortaz antibiotic during last part of HD.

## 2017-11-17 NOTE — Progress Notes (Signed)
Patient Demographics:    [name], is a 57 y.o. male, DOB - 09/07/1960, CVE:[id number]  Admit date - 11/15/2017   Admitting Physician Joshua Costa, MD  Outpatient Primary MD for the patient is Joshua Mccreedy, MD  LOS - 2   Chief Complaint  Patient presents with  . Abdominal Pain        Subjective:    [name] today has no fevers, no emesis,  No chest pain, abdominal pain is better, complains of constipation, tolerated hemodialysis well on 11/17/2017,  Assessment  & Plan :    Principal Problem:   SBP (spontaneous bacterial peritonitis) ([place]) Active Problems:   Alcohol abuse   Cigarette smoker   Essential hypertension   COPD (chronic obstructive pulmonary disease) (H[facility])   History of Cocaine abuse ([place])   Hypertension   Anemia due to chronic kidney disease   ESRD on dialysis ([facility])   Abdominal pain   Liver cirrhosis ([facility])   GERD (gastroesophageal reflux disease)   Brief Summary:  57 y.o. male with medical history significant of alcohol abuse, alcoholic liver cirrhosis with ascites, hypertension, COPD, GERD, anxiety, anemia, polysubstance abuse (tobacco, cocaine and marijuana), HBV, HCV, CAD, ESRD-HD (MWF) admitted 11/15/2017 with abdominal pain in the setting of recently diagnosed SBP and recurrent ascites. 11/18/17 for AV fistulogram by Dr. Juanda Diaz fields    Plan:- 1)SBP-  Pt was Discharged on  11/12/2017, had paracentesis on 11/09/2017 during recent admission, WBC cell count was 2100,  peritoneal/ascitic fluid Gram stain and culture from 11/09/2017 remains negative to date, during last admission infectious disease recommended completing 14 days of IV cefepime and fluconazole, please note that IV cefepime was started around 11/11/2017, patient is for repeat paracentesis on 11/16/2017 with cell count over 2000,  ascitic fluid Gram stain and culture are neg so far.  Despite afebrile status, negative  Gram stain and culture, given abdominal pain, abdominal tenderness and WBC from paracentesis over 2000,  I presume patient does not have spontaneous bacterial peritonitis and per previous infectious disease recommendations will complete 14 days of IV Fortaz starting 11/11/2017   2)ESRD on M/W/F--- hemodialysis per nephrology team, patient is notoriously noncompliant and often leaves AMA before hemodialysis sessions are completed.  Discussed with Dr. Marval Diaz, Last HD 0/2/58  3)Alcoholic, hep B &C cirrhosis with ascites/SBP: Please see #1 above  4)Poor Maturing Right AVF: As per Dr. Valinda Diaz, vascular surgery,  Plan is for fistulogram on 11/18/17  5)Essential hypertension: Poorly controlled due to noncompliance,cntinue diltiazem, hydralazine, Imdur.   6)CAD status post stent to RCA:  no chest pains or ACS type symptoms, continue Plavix and Imdur.  7)Paroxysmal A. NID:POEUMPNT diltiazem.-No chronic anticoagulation as patient is poorly compliant  8)Anemia of ESRD: Stable., EPO per nephrology team  9)Social/Ethics- Palliative care consulted on 11/08/17, initially made DNR during previous admission, pt has ??? About full code at this time,  10)Clogged AVF- 11/18/17 for  AV fistulogram by Dr. Juanda Diaz fields  11) polysubstance abuse-patient is notoriously noncompliant with medical therapy and with hemodialysis sessions  Consultants: Nephrology IR  Procedures: Ultrasound-guided paracentesis by IR 11/16/17 HD 11/18/17 for AV fistulogram by Dr. Juanda Diaz fields Code Status : Full  Disposition Plan  : home in on 11/18/17 after AV fistulogram by Dr. Juanda Diaz fields  DVT Prophylaxis  :  - Heparin -   Lab Results  Component Value Date   PLT 151 11/17/2017    Inpatient Medications  Scheduled Meds: . clopidogrel  75 mg Oral Daily  . darbepoetin (ARANESP) injection - DIALYSIS  40 mcg Intravenous Q Wed-HD  . diltiazem  120 mg Oral Daily  . feeding supplement (NEPRO CARB STEADY)  237  mL Oral BID BM  . fluconazole  200 mg Oral Daily  . fluticasone  2 spray Each Nare Daily  . gabapentin  100 mg Oral BID  . heparin injection (subcutaneous)  5,000 Units Subcutaneous Q8H  . hydrALAZINE  10 mg Oral BID  . isosorbide mononitrate  30 mg Oral Daily  . lactulose  20 g Oral BID  . loratadine  10 mg Oral Daily  . mometasone-formoterol  2 puff Inhalation BID  . nicotine  21 mg Transdermal Daily  . pantoprazole  40 mg Oral Daily  . sevelamer carbonate  3,200 mg Oral TID WC  . tiotropium  18 mcg Inhalation Daily   Continuous Infusions: . cefTAZidime (FORTAZ)  IV 2 g (11/16/17 1809)   PRN Meds:.albuterol, ALPRAZolam, guaiFENesin, hydrALAZINE, hydrOXYzine, morphine injection, ondansetron (ZOFRAN) IV, oxyCODONE, sevelamer carbonate, zolpidem    Anti-infectives (From admission, onward)   Start     Dose/Rate Route Frequency Ordered Stop   11/17/17 1000  cefTAZidime (FORTAZ) 2 g in sodium chloride 0.9 % 100 mL IVPB     2 g 200 mL/hr over 30 Minutes Intravenous  Once 11/17/17 0915 11/17/17 1253   11/16/17 1800  cefTAZidime (FORTAZ) 2 g in sodium chloride 0.9 % 100 mL IVPB     2 g 200 mL/hr over 30 Minutes Intravenous Every M-W-F (1800) 11/15/17 2151     11/15/17 2200  cefTAZidime (FORTAZ) 1 g in sodium chloride 0.9 % 100 mL IVPB     1 g 200 mL/hr over 30 Minutes Intravenous  Once 11/15/17 2151 11/15/17 2353   11/15/17 2200  fluconazole (DIFLUCAN) tablet 200 mg     200 mg Oral Daily 11/15/17 2151          Objective:   Vitals:   11/17/17 1145 11/17/17 1200 11/17/17 1205 11/17/17 1251  BP: (!) 145/74 (!) 147/76 (!) 151/72 (!) 161/83  Pulse: 87 86 94 87  Resp:   18 18  Temp:   98.9 F (37.2 C) 98.9 F (37.2 C)  TempSrc:   Oral Oral  SpO2:   96% 99%  Weight:   70.2 kg (154 lb 12.2 oz)   Height:        Wt Readings from Last 3 Encounters:  11/17/17 70.2 kg (154 lb 12.2 oz)  11/12/17 75.8 kg (167 lb 1.7 oz)  10/29/17 68 kg (149 lb 14.6 oz)     Intake/Output Summary  (Last 24 hours) at 11/17/2017 1508 Last data filed at 11/17/2017 1205 Gross per 24 hour  Intake 820 ml  Output 5600 ml  Net -4780 ml    Physical Exam  Gen:- Awake Alert, in no acute distress HEENT:- [name].AT, No sclera icterus Neck-Supple Neck,No JVD,.  Hemodialysis catheter site is clean dry and intact Lungs-diminished in bases, no wheezing CV- S1, S2 normal Abd-  +ve B.Sounds, Abd Soft, No tenderness,    Extremity/Skin:- No  edema,  AV fistula site Psych-affect is appropriate, oriented x3 Neuro-no new focal deficits, no tremors   Data Review:   Micro Results Recent Results (from the past 240 hour(s))  Gram stain  Status: None   Collection Time: 11/09/17  2:41 PM  Result Value Ref Range Status   Specimen Description PERITONEAL  Final   Special Requests NONE  Final   Gram Stain   Final    ABUNDANT WBC PRESENT,BOTH PMN AND MONONUCLEAR NO ORGANISMS SEEN Performed at [facility], [address] [place], [place] [address]    Report Status 11/09/2017 FINAL  Final  Culture, body fluid-bottle     Status: None   Collection Time: 11/09/17  2:41 PM  Result Value Ref Range Status   Specimen Description PERITONEAL  Final   Special Requests NONE  Final   Culture   Final    NO GROWTH 5 DAYS Performed at [facility] [address] [place], [place] [address]    Report Status 11/14/2017 FINAL  Final  Culture, blood (x 2)     Status: None (Preliminary result)   Collection Time: 11/15/17 10:54 PM  Result Value Ref Range Status   Specimen Description BLOOD LEFT ARM  Final   Special Requests   Final    BOTTLES DRAWN AEROBIC AND ANAEROBIC Blood Culture adequate volume   Culture   Final    NO GROWTH 1 DAY Performed at [facility], [place] [address], [place], [place] [address]    Report Status PENDING  Incomplete  Culture, blood (x 2)     Status: None (Preliminary result)   Collection Time: 11/15/17 11:55 PM  Result Value Ref Range Status   Specimen Description  BLOOD RIGHT FOREARM  Final   Special Requests   Final    BOTTLES DRAWN AEROBIC AND ANAEROBIC Blood Culture adequate volume   Culture   Final    NO GROWTH 1 DAY Performed at [facility], [place] [address] [place], [place] [address]    Report Status PENDING  Incomplete  Body fluid culture     Status: None (Preliminary result)   Collection Time: 11/16/17  3:41 PM  Result Value Ref Range Status   Specimen Description PERITONEAL CAVITY  Final   Special Requests NONE  Final   Gram Stain   Final    ABUNDANT WBC PRESENT, PREDOMINANTLY PMN NO ORGANISMS SEEN    Culture   Final    NO GROWTH < 24 HOURS Performed at [facility], [place] [address] [place], [place] [address]    Report Status PENDING  Incomplete  MRSA PCR Screening     Status: None   Collection Time: 11/17/17  2:27 AM  Result Value Ref Range Status   MRSA by PCR NEGATIVE NEGATIVE Final    Comment:        The GeneXpert MRSA Assay (FDA approved for NASAL specimens only), is one component of a comprehensive MRSA colonization surveillance program. It is not intended to diagnose MRSA infection nor to guide or monitor treatment for MRSA infections. Performed at [facility], [place] [address] [place], [place] [address]     Radiology Reports Dg Chest 2 View  Result Date: 11/15/2017 CLINICAL DATA:  Abdominal distention with productive cough. EXAM: CHEST - 2 VIEW COMPARISON:  None. FINDINGS: The heart is enlarged. Dialysis catheter tips project over the RIGHT atrium. Mild vascular congestion without consolidation or edema. No effusion or pneumothorax. No acute osseous findings. IMPRESSION: Cardiomegaly. Mild vascular congestion. No frank edema or consolidation. Electronically Signed   By: Staci Righter M.D.   On: 11/15/2017 19:37   Dg Chest 2 View  Result Date:  10/27/2017 CLINICAL DATA:  Sharp abdominal pain for 3 days.  Dyspnea. EXAM: CHEST - 2 VIEW COMPARISON:  10/03/2017 FINDINGS: Stable cardiomegaly with aortic  atherosclerosis. Dialysis catheter tip from right IJ approach terminates in the mid right atrium. Interstitial edema is noted with left basilar atelectasis and chronic elevation of the left hemidiaphragm. No acute nor suspicious osseous abnormality. IMPRESSION: 1. Stable cardiomegaly with mild interstitial pulmonary edema, slightly worse than on comparison. No effusion or pneumothorax. 2. Aortic atherosclerosis without aneurysm. Electronically Signed   By: [place] Royalty M.D.   On: 10/27/2017 18:45   Ct Abdomen Pelvis W Contrast  Result Date: 11/16/2017 CLINICAL DATA:  Chronic periumbilical abdominal pain. Loss of appetite. Dialysis patient. EXAM: CT ABDOMEN AND PELVIS WITH CONTRAST TECHNIQUE: Multidetector CT imaging of the abdomen and pelvis was performed using the standard protocol following bolus administration of intravenous contrast. CONTRAST:  68mL OMNIPAQUE IOHEXOL 300 MG/ML  SOLN COMPARISON:  10/25/2017 FINDINGS: Lower chest: Cardiomegaly.  Lung bases are clear.  No pleural fluid. Hepatobiliary: Cirrhosis as manifest by lobulation of the liver surface and enlargement of the left lobe and caudate lobe. No definable focal mass. No calcified gallstones. Portal vein shows flow. Pancreas: Normal Spleen: Normal size.  No focal lesion. Adrenals/Urinary Tract: No adrenal mass. Both kidneys are small with extensive vascular calcification and a 1 cm cyst at the lower pole on the left. Stomach/Bowel: No bowel pathology discernible. No evidence of obstruction. Clip in the region of the rectum as seen previously as distant as 09/02/2017 Vascular/Lymphatic: Advanced atherosclerosis of the aorta and its branch vessels. No aneurysm. No retroperitoneal mass or adenopathy. Reproductive: Negative Other: Large amount of ascites freely distributed throughout the peritoneal cavity. Musculoskeletal: Chronic degenerative disc disease at L5-S1. Chronic bone changes of renal failure. IMPRESSION: Large amount of ascites, freely  distributed. Cirrhosis of the liver without evidence of focal or acute finding. Cardiomegaly. Advanced aortic atherosclerosis and atherosclerosis of the branch vessels. Electronically Signed   By: Nelson Chimes M.D.   On: 11/16/2017 11:52   Ct Abdomen Pelvis W Contrast  Result Date: 10/25/2017 CLINICAL DATA:  Acute abdominal pain, generalized. EXAM: CT ABDOMEN AND PELVIS WITH CONTRAST TECHNIQUE: Multidetector CT imaging of the abdomen and pelvis was performed using the standard protocol following bolus administration of intravenous contrast. CONTRAST:  77mL ISOVUE-300 IOPAMIDOL (ISOVUE-300) INJECTION 61% COMPARISON:  09/28/2017 FINDINGS: Lower chest: Cardiomegaly. Central line with tip in the right atrium. Overall small pericardial effusion, stable. Extensive atherosclerotic calcification of the coronaries and aorta. Hepatobiliary: Lobulated liver surface and large caudate lobe suggesting cirrhosis. No focal mass. Patent portal vein.No evidence of biliary obstruction or stone. Pancreas: Stable Spleen: Unremarkable. Adrenals/Urinary Tract: Negative adrenals. Marked bilateral renal atrophy with small cystic densities. Patient has history of end-stage renal disease. Unremarkable bladder. Stomach/Bowel: No obstruction. No visible inflammation. There is a clip noted within the rectum. Vascular/Lymphatic: No acute vascular abnormality. Severe atherosclerosis with confluent calcification along the aorta and visceral branches. No mass or adenopathy. Reproductive:No pathologic findings. Other: There is a large volume ascites without visible loculation. Chronic peritoneal thickening in the pelvis, seen since at least 04/2017 Musculoskeletal: Generalized bony sclerosis, likely renal osteodystrophy. IMPRESSION: 1. No acute finding. 2. Large volume ascites. Chronic peritoneal thickening in the pelvis, please correlate with recent paracentesis results. 3. Suspected cirrhosis. 4.  Aortic Atherosclerosis (ICD10-I70.0).  Coronary  atherosclerosis. Electronically Signed   By: Monte Fantasia M.D.   On: 10/25/2017 09:49   Dg Chest Port 1 View  Result Date: 11/08/2017 CLINICAL  DATA:  Shortness of breath.  Smoker EXAM: PORTABLE CHEST 1 VIEW COMPARISON:  11/04/2017 FINDINGS: Decreased inspiration. Stable enlarged cardiac silhouette. The right jugular double-lumen catheter tips remain in the right atrium, currently in the mid to lower right atrium. Stable prominence of the interstitial markings and mild prominence of the pulmonary vasculature. Minimal left basilar linear atelectasis and possible small left pleural effusion. Diffuse osteopenia. Atheromatous arterial calcifications. IMPRESSION: 1. Decreased inspiration with stable mild elevation of the left hemidiaphragm and interval minimal left basilar linear atelectasis or scarring and possible small left pleural effusion. 2. Stable cardiomegaly, mild pulmonary vascular congestion and mild chronic interstitial lung disease. Electronically Signed   By: Claudie Revering M.D.   On: 11/08/2017 15:38   Dg Chest Port 1 View  Result Date: 11/04/2017 CLINICAL DATA:  Productive cough with generalized weakness and dyspnea. EXAM: PORTABLE CHEST 1 VIEW COMPARISON:  10/29/2017 FINDINGS: Stable cardiomegaly with aortic atherosclerosis. Mild interstitial edema. No pulmonary consolidation. Left basilar atelectasis is identified with slight elevation of the left hemidiaphragm, chronic in appearance. Right IJ dialysis catheter tip remains in the mid right atrium. No acute osseous abnormality. IMPRESSION: Cardiomegaly with mild interstitial edema. Aortic atherosclerosis. Dialysis catheter tip in the mid right atrium. Electronically Signed   By: [place] Royalty M.D.   On: 11/04/2017 03:23   Dg Chest Port 1 View  Result Date: 10/29/2017 CLINICAL DATA:  Chest pain and shortness of breath for 1 day. EXAM: PORTABLE CHEST 1 VIEW COMPARISON:  10/27/2017. FINDINGS: Stable enlarged cardiac silhouette. The right jugular  catheter tip is in the right atrium. The left hemidiaphragm remains elevated. Clear lungs with normal vascularity. IMPRESSION: 1. The right jugular catheter tip remains in the mid right atrium. 2. Stable cardiomegaly and elevation of the left hemidiaphragm. Electronically Signed   By: Claudie Revering M.D.   On: 10/29/2017 11:31   Ir Abdomen US Limited  Result Date: 11/09/2017 CLINICAL DATA:  History of cirrhosis with recurrent symptomatic ascites. Please perform ultrasound-guided paracentesis for therapeutic purposes. EXAM: LIMITED ABDOMEN ULTRASOUND FOR ASCITES TECHNIQUE: Limited ultrasound survey for ascites was performed in all four abdominal quadrants. COMPARISON:  Ascites search ultrasound-11/04/2017; ultrasound-guided paracentesis-10/28/2017; CT abdomen and pelvis - 10/25/2017 FINDINGS: Sonographic evaluation of the abdomen demonstrates a small amount of predominantly perihepatic intra-abdominal ascites. While the amount of fluid is likely amenable to ultrasound-guided paracentesis, the patient refused the procedure and as such, no procedure was performed. IMPRESSION: Small amount of intra-abdominal ascites, potentially amenable to ultrasound-guided paracentesis however the present time, the patient refuses to undergo the paracentesis Referring physician was made aware of patient's request. Electronically Signed   By: Sandi Mariscal M.D.   On: 11/09/2017 10:20   Ir Abdomen US Limited  Result Date: 11/04/2017 CLINICAL DATA:  Abdominal pain, recurrent abdominal ascites. Paracentesis requested. EXAM: LIMITED ABDOMEN ULTRASOUND FOR ASCITES TECHNIQUE: Limited ultrasound survey for ascites was performed in all four abdominal quadrants. COMPARISON:  10/28/2017 FINDINGS: Survey ultrasound demonstrates a small amount of scattered abdominal ascites. No large pocket to allow safe therapeutic paracentesis. IMPRESSION: Small amount of scattered abdominal ascites. Therapeutic paracentesis deferred. Electronically Signed    By: Lucrezia Europe M.D.   On: 11/04/2017 09:44   Ir Paracentesis  Result Date: 11/16/2017 INDICATION: Recurrent ascites EXAM: ULTRASOUND-GUIDED PARACENTESIS COMPARISON:  Previous paracentesis. MEDICATIONS: 10 cc 2% lidocaine. COMPLICATIONS: None immediate. TECHNIQUE: Informed written consent was obtained from the patient after a discussion of the risks, benefits and alternatives to treatment. A timeout was performed prior to the initiation of  the procedure. Initial ultrasound scanning demonstrates a large amount of ascites within the left lower abdominal quadrant. The right lower abdomen was prepped and draped in the usual sterile fashion. 1% lidocaine with epinephrine was used for local anesthesia. Under direct ultrasound guidance, a 19 gauge, 7-cm, Yueh catheter was introduced. An ultrasound image was saved for documentation purposed. The paracentesis was performed. The catheter was removed and a dressing was applied. The patient tolerated the procedure well without immediate post procedural complication. FINDINGS: A total of approximately 2.1 liters of yellow fluid was removed. Samples were sent to the laboratory as requested by the clinical team. IMPRESSION: Successful ultrasound-guided paracentesis yielding 2.1 liters of peritoneal fluid. Read by Lavonia Drafts [facility] Electronically Signed   By: Marybelle Killings M.D.   On: 11/16/2017 15:48   Ir Paracentesis  Result Date: 11/09/2017 INDICATION: Recurrent symptomatic ascites. Please perform ultrasound-guided paracentesis for diagnostic and therapeutic purposes. EXAM: ULTRASOUND-GUIDED PARACENTESIS COMPARISON:  Ultrasound-guided paracentesis - 10/28/2017 CT abdomen and pelvis - 10/25/2017 MEDICATIONS: None. COMPLICATIONS: None immediate. TECHNIQUE: Informed written consent was obtained from the patient after a discussion of the risks, benefits and alternatives to treatment. A timeout was performed prior to the initiation of the procedure. Initial ultrasound scanning  demonstrates a small to moderate amount of ascites within the right lower abdominal quadrant. The right lower abdomen was prepped and draped in the usual sterile fashion. 1% lidocaine with epinephrine was used for local anesthesia. Under direct ultrasound guidance, a 19 gauge, 7-cm, Yueh catheter was introduced. An ultrasound image was saved for documentation purposed. The paracentesis was performed. The catheter was removed and a dressing was applied. The patient tolerated the procedure well without immediate post procedural complication. FINDINGS: A total of approximately 3 liters of serous fluid was removed. Samples were sent to the laboratory as requested by the clinical team. IMPRESSION: Successful ultrasound-guided paracentesis yielding 3 liters of peritoneal fluid. Electronically Signed   By: Sandi Mariscal M.D.   On: 11/09/2017 15:43   Ir Paracentesis  Result Date: 10/28/2017 INDICATION: Patient with recurrent ascites. Request is made for diagnostic and therapeutic paracentesis. EXAM: ULTRASOUND GUIDED DIAGNOSTIC AND THERAPEUTIC PARACENTESIS MEDICATIONS: 10 mL 2% lidocaine COMPLICATIONS: None immediate. PROCEDURE: Informed written consent was obtained from the patient after a discussion of the risks, benefits and alternatives to treatment. A timeout was performed prior to the initiation of the procedure. Initial ultrasound scanning demonstrates a small amount of ascites within the left lateral abdomen. The left lateral abdomen was prepped and draped in the usual sterile fashion. 2% lidocaine was used for local anesthesia. Following this, a 19 gauge, 7-cm, Yueh catheter was introduced. An ultrasound image was saved for documentation purposes. The paracentesis was performed. The catheter was removed and a dressing was applied. The patient tolerated the procedure well without immediate post procedural complication. FINDINGS: A total of approximately 5 mL of amber fluid was removed. Samples were sent to the  laboratory as requested by the clinical team. IMPRESSION: Successful ultrasound-guided diagnostic and therapeutic paracentesis yielding 5 mL liters of peritoneal fluid. Read by: Brynda Greathouse PA-C Electronically Signed   By: Sandi Mariscal M.D.   On: 10/28/2017 12:07     CBC Recent Labs  Lab 11/12/17 0737 11/12/17 2155 11/15/17 0919 11/17/17 0757  WBC 15.1* 13.0* 11.4* 10.2  HGB 8.8* 8.8* 9.7* 8.2*  HCT 26.2* 26.0* 29.0* 24.4*  PLT 215 198 226 151  MCV 74.2* 74.3* 74.9* 73.7*  MCH 24.9* 25.1* 25.1* 24.8*  MCHC 33.6 33.8 33.4  33.6  RDW 21.4* 21.7* 21.8* 21.3*    Chemistries  Recent Labs  Lab 11/12/17 0737 11/12/17 2155 11/15/17 0919 11/17/17 0756  NA 127* 130* 133* 131*  K 3.7 5.1 4.6 4.8  CL 83* 93* 93* 91*  CO2 27 25 26 25   GLUCOSE 124* 137* 131* 133*  BUN 57* 32* 37* 58*  CREATININE 6.29* 4.53* 5.15* 7.79*  CALCIUM 9.5 9.0 8.5* 8.0*  AST  --  40 26  --   ALT  --  27 20  --   ALKPHOS  --  215* 190*  --   BILITOT  --  0.8 1.1  --    ------------------------------------------------------------------------------------------------------------------ No results for input(s): CHOL, HDL, LDLCALC, TRIG, CHOLHDL, LDLDIRECT in the last 72 hours.  Lab Results  Component Value Date   HGBA1C 4.6 (L) 04/24/2017   ------------------------------------------------------------------------------------------------------------------ No results for input(s): TSH, T4TOTAL, T3FREE, THYROIDAB in the last 72 hours.  Invalid input(s): FREET3 ------------------------------------------------------------------------------------------------------------------ No results for input(s): VITAMINB12, FOLATE, FERRITIN, TIBC, IRON, RETICCTPCT in the last 72 hours.  Coagulation profile Recent Labs  Lab 11/15/17 1841  INR 1.21    No results for input(s): DDIMER in the last 72 hours.  Cardiac Enzymes No results for input(s): CKMB, TROPONINI, MYOGLOBIN in the last 168 hours.  Invalid input(s):  CK ------------------------------------------------------------------------------------------------------------------    Component Value Date/Time   BNP 943.6 (H) 08/29/2017 1235     Zaul Hubers M.D on 11/17/2017 at 3:08 PM  Between 7am to 7pm - Pager - [phone]  After 7pm go to [email or web address] - password [email or web address]  [facility] -  Office  [phone]   Voice Recognition Viviann Spare dictation system was used to create this note, attempts have been made to correct errors. Please contact the author with questions and/or clarifications.

## 2017-11-17 NOTE — Telephone Encounter (Signed)
We have attempted to contact the pt several times with no success or call back from the pt. Per triage protocol, message will be closed.  

## 2017-11-17 NOTE — Plan of Care (Signed)
Problem: Education: Goal: Knowledge of General Education information will improve Outcome: Progressing   Problem: Activity: Goal: Risk for activity intolerance will decrease Outcome: Progressing   Problem: Nutrition: Goal: Adequate nutrition will be maintained Outcome: Progressing  Patient  unhappy about Dilaudid and Xanax being discontinued on admission to 2W, Patient says may leave AMA if MD doesn't add in PRN's that he will request in the morning to rounding provider.  No acute events overnight. Will continue to monitor

## 2017-11-17 NOTE — Progress Notes (Signed)
[facility] Progress Note    Subjective:   No new complaints.    Objective:   BP (!) 136/43   Pulse 86   Temp 99.9 F (37.7 C) (Oral)   Resp 16   Ht 5\' 9"  (1.753 m)   Wt 73.9 kg (162 lb 14.7 oz)   SpO2 95%   BMI 24.06 kg/m   Intake/Output: I/O last 3 completed shifts: In: 294 [P.O.:720; IV Piggyback:200] Out: 2100 [Other:2100]   Intake/Output this shift:  No intake/output data recorded. Weight change: 6.305 kg (13 lb 14.4 oz)  Physical Exam: Gen:NAD CVS:no rub Resp: cta Abd: +distended, fluid wave, mildly tender3 Ext:no edema  Labs: BMET Recent Labs  Lab 11/10/17 1025 11/12/17 0737 11/12/17 2155 11/15/17 0919 11/17/17 0756  NA 140 127* 130* 133* 131*  K 3.6 3.7 5.1 4.6 4.8  CL 102 83* 93* 93* 91*  CO2 21* 27 25 26 25   GLUCOSE 149* 124* 137* 131* 133*  BUN 53* 57* 32* 37* 58*  CREATININE 5.59* 6.29* 4.53* 5.15* 7.79*  ALBUMIN 2.4* 3.1* 3.0* 2.9* 2.8*  CALCIUM 6.3* 9.5 9.0 8.5* 8.0*  PHOS 3.2 2.7  --   --  5.0*   CBC Recent Labs  Lab 11/12/17 0737 11/12/17 2155 11/15/17 0919 11/17/17 0757  WBC 15.1* 13.0* 11.4* 10.2  HGB 8.8* 8.8* 9.7* 8.2*  HCT 26.2* 26.0* 29.0* 24.4*  MCV 74.2* 74.3* 74.9* 73.7*  PLT 215 198 226 151    @IMGRELPRIORS @ Medications:    . clopidogrel  75 mg Oral Daily  . darbepoetin (ARANESP) injection - DIALYSIS  40 mcg Intravenous Q Wed-HD  . diltiazem  120 mg Oral Daily  . feeding supplement (NEPRO CARB STEADY)  237 mL Oral BID BM  . fluconazole  200 mg Oral Daily  . fluticasone  2 spray Each Nare Daily  . gabapentin  100 mg Oral BID  . heparin injection (subcutaneous)  5,000 Units Subcutaneous Q8H  . hydrALAZINE  10 mg Oral BID  . isosorbide mononitrate  30 mg Oral Daily  . lactulose  20 g Oral BID  . loratadine  10 mg Oral Daily  . mometasone-formoterol  2 puff Inhalation BID  . nicotine  21 mg Transdermal Daily  . pantoprazole  40 mg Oral Daily  . sevelamer carbonate  3,200 mg Oral TID WC  .  tiotropium  18 mcg Inhalation Daily   Dialysis Orders: [facility] M[facility] 4 hours 180 NRe 400/800 69 kg 2.0 K/2.0 Ca  -No heparin -Parsabiv 5mg  IV TIW -Mircera 225 mcg IV q 2 weeks (last dose 10/26/2017 Last HGB 9.1 11/14/17) -Calcitriol 0.5 mcg PO TIW     Assessment/ Plan:   1.  SBP vs Ascites: Per primary. CT of abdomen revealed cirrhosis and large ascites and feels a little better after paracentesis.  Rigidity was likely just tense ascites as his abdomen was still tender but soft.  On fluconazole and ceftazidime, however cultures from last admission are negative so should be ok to stop antifungals and abx per ID on 11/11/17 but will defer to primary, although still with >2000 WBC from paracentesis yesterday. Afebrile since adm.  2.  ESRD -  MWF HD today on schedule. K+ 4.6 No heparin  3.  Hypertension/volume  -Hypertensive at present however also in pain. On dilt and hydralazine. No evidence of volume overload by exam but appears to have lost wt. Mild vascular congestion on CXR 11/15/17. Extra HD session today to help with UF and volume.  4.  Anemia  -HGB 9.7. No recent ESA for [facility] Center. Give Aranesp 40 mcg IV today. Follow HGB.  5.  Metabolic bone disease - Add renal function panel to today's labs. Continue binders, VDRA. Parsabiv not available on hospital formulary  6.  Nutrition -NPO at present. Albumin 2.9 7.  Cirrhosis: ammonia level 40. Appears to have ascites-may need paracentesis. Per primary 8. H/O polysubstance abuse 9. H/O medical noncompliance.   10. Disposition per primary.   Donetta Potts, MD [place] Pager [phone] 11/17/2017, 10:22 AM

## 2017-11-18 ENCOUNTER — Ambulatory Visit (HOSPITAL_COMMUNITY): Admission: RE | Admit: 2017-11-18 | Payer: Medicare Other | Source: Ambulatory Visit | Admitting: Vascular Surgery

## 2017-11-18 ENCOUNTER — Encounter (HOSPITAL_COMMUNITY)
Admission: EM | Disposition: A | Discharge status: Home / Self Care | Payer: Self-pay | Source: Home / Self Care | Attending: Family Medicine

## 2017-11-18 ENCOUNTER — Other Ambulatory Visit: Payer: Self-pay | Admitting: *Deleted

## 2017-11-18 ENCOUNTER — Encounter (HOSPITAL_COMMUNITY): Payer: Self-pay | Admitting: Vascular Surgery

## 2017-11-18 DIAGNOSIS — K7011 Alcoholic hepatitis with ascites: Secondary | ICD-10-CM

## 2017-11-18 DIAGNOSIS — N186 End stage renal disease: Secondary | ICD-10-CM

## 2017-11-18 DIAGNOSIS — Z992 Dependence on renal dialysis: Secondary | ICD-10-CM

## 2017-11-18 DIAGNOSIS — T82898A Other specified complication of vascular prosthetic devices, implants and grafts, initial encounter: Secondary | ICD-10-CM

## 2017-11-18 HISTORY — PX: A/V FISTULAGRAM: CATH118298

## 2017-11-18 LAB — HEPATITIS B SURFACE ANTIGEN

## 2017-11-18 LAB — GLUCOSE, CAPILLARY
Glucose-Capillary: 158 mg/dL — ABNORMAL HIGH (ref 65–99)
Glucose-Capillary: 114 mg/dL — ABNORMAL HIGH (ref 65–99)

## 2017-11-18 LAB — HEPATITIS B SURFACE AG, CONFIRM: HBsAg Confirmation: POSITIVE — AB

## 2017-11-18 SURGERY — A/V FISTULAGRAM
Anesthesia: LOCAL | Laterality: Right

## 2017-11-18 MED ORDER — LIDOCAINE HCL (PF) 1 % IJ SOLN
INTRAMUSCULAR | Status: AC
  Filled 2017-11-18: qty 30

## 2017-11-18 MED ORDER — OXYCODONE HCL 5 MG PO TABS
10.0000 mg | ORAL_TABLET | ORAL | Status: DC | PRN
  Administered 2017-11-18 – 2017-11-19 (×3): 10 mg via ORAL
  Filled 2017-11-18 (×3): qty 2

## 2017-11-18 MED ORDER — HEPARIN (PORCINE) IN NACL 1000-0.9 UT/500ML-% IV SOLN
INTRAVENOUS | Status: AC
  Filled 2017-11-18: qty 500

## 2017-11-18 MED ORDER — IODIXANOL 320 MG/ML IV SOLN
INTRAVENOUS | Status: DC | PRN
  Administered 2017-11-18: 30 mL via INTRAVENOUS

## 2017-11-18 MED ORDER — SIMETHICONE 80 MG PO CHEW
80.0000 mg | CHEWABLE_TABLET | Freq: Four times a day (QID) | ORAL | Status: DC | PRN
  Administered 2017-11-18: 80 mg via ORAL
  Filled 2017-11-18: qty 1

## 2017-11-18 MED ORDER — LIDOCAINE HCL (PF) 1 % IJ SOLN
INTRAMUSCULAR | Status: DC | PRN
  Administered 2017-11-18: 2 mL

## 2017-11-18 MED ORDER — HEPARIN (PORCINE) IN NACL 2-0.9 UNITS/ML
INTRAMUSCULAR | Status: AC | PRN
  Administered 2017-11-18: 500 mL

## 2017-11-18 MED ORDER — DARBEPOETIN ALFA 40 MCG/0.4ML IJ SOSY
40.0000 ug | PREFILLED_SYRINGE | INTRAMUSCULAR | Status: DC
  Administered 2017-11-19: 40 ug via INTRAVENOUS
  Filled 2017-11-18: qty 0.4

## 2017-11-18 SURGICAL SUPPLY — 10 items
BAG SNAP BAND KOVER 36X36 (MISCELLANEOUS) ×2 IMPLANT
COVER DOME SNAP 22 D (MISCELLANEOUS) ×2 IMPLANT
COVER PRB 48X5XTLSCP FOLD TPE (BAG) ×1 IMPLANT
COVER PROBE 5X48 (BAG) ×2
KIT MICROPUNCTURE NIT STIFF (SHEATH) ×1 IMPLANT
PROTECTION STATION PRESSURIZED (MISCELLANEOUS) ×2
STATION PROTECTION PRESSURIZED (MISCELLANEOUS) ×1 IMPLANT
STOPCOCK MORSE 400PSI 3WAY (MISCELLANEOUS) ×2 IMPLANT
TRAY PV CATH (CUSTOM PROCEDURE TRAY) ×2 IMPLANT
TUBING CIL FLEX 10 FLL-RA (TUBING) ×2 IMPLANT

## 2017-11-18 NOTE — Progress Notes (Signed)
S:No new complaints.  Reports that he will have his vascular access placed on Tuesday. O:BP (!) 160/89   Pulse (!) 107   Temp 97.6 F (36.4 C) (Oral)   Resp 18   Ht 5\' 9"  (1.753 m)   Wt 68.9 kg (151 lb 12.8 oz)   SpO2 100%   BMI 22.42 kg/m   Intake/Output Summary (Last 24 hours) at 11/18/2017 1022 Last data filed at 11/18/2017 0600 Gross per 24 hour  Intake 1200 ml  Output 3500 ml  Net -2300 ml   Intake/Output: I/O last 3 completed shifts: In: 2020 [P.O.:1920; IV Piggyback:100] Out: 3500 [Other:3500]  Intake/Output this shift:  No intake/output data recorded. Weight change: -2.713 kg (-5 lb 15.7 oz) Gen: NAD CVS: tachy Resp:scattered rhonchi Abd: distended, +BS, soft, mildly tender to deep palpation Ext: no edema, RAVF pulsatile with collaterals present  Recent Labs  Lab 11/12/17 0737 11/12/17 2155 11/15/17 0919 11/17/17 0756  NA 127* 130* 133* 131*  K 3.7 5.1 4.6 4.8  CL 83* 93* 93* 91*  CO2 27 25 26 25   GLUCOSE 124* 137* 131* 133*  BUN 57* 32* 37* 58*  CREATININE 6.29* 4.53* 5.15* 7.79*  ALBUMIN 3.1* 3.0* 2.9* 2.8*  CALCIUM 9.5 9.0 8.5* 8.0*  PHOS 2.7  --   --  5.0*  AST  --  40 26  --   ALT  --  27 20  --    Liver Function Tests: Recent Labs  Lab 11/12/17 2155 11/15/17 0919 11/17/17 0756  AST 40 26  --   ALT 27 20  --   ALKPHOS 215* 190*  --   BILITOT 0.8 1.1  --   PROT 6.7 6.8  --   ALBUMIN 3.0* 2.9* 2.8*   Recent Labs  Lab 11/12/17 2155 11/15/17 0919  LIPASE 26 24   Recent Labs  Lab 11/15/17 1918  AMMONIA 40*   CBC: Recent Labs  Lab 11/12/17 0737 11/12/17 2155 11/15/17 0919 11/17/17 0757  WBC 15.1* 13.0* 11.4* 10.2  HGB 8.8* 8.8* 9.7* 8.2*  HCT 26.2* 26.0* 29.0* 24.4*  MCV 74.2* 74.3* 74.9* 73.7*  PLT 215 198 226 151   Cardiac Enzymes: No results for input(s): CKTOTAL, CKMB, CKMBINDEX, TROPONINI in the last 168 hours. CBG: Recent Labs  Lab 11/16/17 1208 11/18/17 0702 11/18/17 0738  GLUCAP 75 158* 114*    Iron  Studies: No results for input(s): IRON, TIBC, TRANSFERRIN, FERRITIN in the last 72 hours. Studies/Results: Ct Abdomen Pelvis W Contrast  Result Date: 11/16/2017 CLINICAL DATA:  Chronic periumbilical abdominal pain. Loss of appetite. Dialysis patient. EXAM: CT ABDOMEN AND PELVIS WITH CONTRAST TECHNIQUE: Multidetector CT imaging of the abdomen and pelvis was performed using the standard protocol following bolus administration of intravenous contrast. CONTRAST:  47mL OMNIPAQUE IOHEXOL 300 MG/ML  SOLN COMPARISON:  10/25/2017 FINDINGS: Lower chest: Cardiomegaly.  Lung bases are clear.  No pleural fluid. Hepatobiliary: Cirrhosis as manifest by lobulation of the liver surface and enlargement of the left lobe and caudate lobe. No definable focal mass. No calcified gallstones. Portal vein shows flow. Pancreas: Normal Spleen: Normal size.  No focal lesion. Adrenals/Urinary Tract: No adrenal mass. Both kidneys are small with extensive vascular calcification and a 1 cm cyst at the lower pole on the left. Stomach/Bowel: No bowel pathology discernible. No evidence of obstruction. Clip in the region of the rectum as seen previously as distant as 09/02/2017 Vascular/Lymphatic: Advanced atherosclerosis of the aorta and its branch vessels. No aneurysm. No retroperitoneal mass or  adenopathy. Reproductive: Negative Other: Large amount of ascites freely distributed throughout the peritoneal cavity. Musculoskeletal: Chronic degenerative disc disease at L5-S1. Chronic bone changes of renal failure. IMPRESSION: Large amount of ascites, freely distributed. Cirrhosis of the liver without evidence of focal or acute finding. Cardiomegaly. Advanced aortic atherosclerosis and atherosclerosis of the branch vessels. Electronically Signed   By: Nelson Chimes M.D.   On: 11/16/2017 11:52   Ir Paracentesis  Result Date: 11/16/2017 INDICATION: Recurrent ascites EXAM: ULTRASOUND-GUIDED PARACENTESIS COMPARISON:  Previous paracentesis. MEDICATIONS:  10 cc 2% lidocaine. COMPLICATIONS: None immediate. TECHNIQUE: Informed written consent was obtained from the patient after a discussion of the risks, benefits and alternatives to treatment. A timeout was performed prior to the initiation of the procedure. Initial ultrasound scanning demonstrates a large amount of ascites within the left lower abdominal quadrant. The right lower abdomen was prepped and draped in the usual sterile fashion. 1% lidocaine with epinephrine was used for local anesthesia. Under direct ultrasound guidance, a 19 gauge, 7-cm, Yueh catheter was introduced. An ultrasound image was saved for documentation purposed. The paracentesis was performed. The catheter was removed and a dressing was applied. The patient tolerated the procedure well without immediate post procedural complication. FINDINGS: A total of approximately 2.1 liters of yellow fluid was removed. Samples were sent to the laboratory as requested by the clinical team. IMPRESSION: Successful ultrasound-guided paracentesis yielding 2.1 liters of peritoneal fluid. Read by Lavonia Drafts [facility] Electronically Signed   By: Marybelle Killings M.D.   On: 11/16/2017 15:48   . bisacodyl  10 mg Rectal Once  . clopidogrel  75 mg Oral Daily  . darbepoetin (ARANESP) injection - DIALYSIS  40 mcg Intravenous Q Fri-HD  . diltiazem  120 mg Oral Daily  . feeding supplement (NEPRO CARB STEADY)  237 mL Oral BID BM  . fluconazole  200 mg Oral Daily  . fluticasone  2 spray Each Nare Daily  . gabapentin  100 mg Oral BID  . heparin injection (subcutaneous)  5,000 Units Subcutaneous Q8H  . hydrALAZINE  10 mg Oral BID  . isosorbide mononitrate  30 mg Oral Daily  . lactulose  20 g Oral BID  . loratadine  10 mg Oral Daily  . mometasone-formoterol  2 puff Inhalation BID  . nicotine  21 mg Transdermal Daily  . pantoprazole  40 mg Oral Daily  . sevelamer carbonate  3,200 mg Oral TID WC  . tiotropium  18 mcg Inhalation Daily    BMET    Component Value  Date/Time   NA 131 (L) 11/17/2017 0756   K 4.8 11/17/2017 0756   CL 91 (L) 11/17/2017 0756   CO2 25 11/17/2017 0756   GLUCOSE 133 (H) 11/17/2017 0756   BUN 58 (H) 11/17/2017 0756   CREATININE 7.79 (H) 11/17/2017 0756   CREATININE 9.18 (H) 02/15/2017 1425   CALCIUM 8.0 (L) 11/17/2017 0756   CALCIUM 8.1 (L) 07/11/2011 1117   GFRNONAA 7 (L) 11/17/2017 0756   GFRNONAA 6 (L) 02/15/2017 1425   GFRAA 8 (L) 11/17/2017 0756   GFRAA 7 (L) 02/15/2017 1425   CBC    Component Value Date/Time   WBC 10.2 11/17/2017 0757   RBC 3.31 (L) 11/17/2017 0757   HGB 8.2 (L) 11/17/2017 0757   HCT 24.4 (L) 11/17/2017 0757   PLT 151 11/17/2017 0757   MCV 73.7 (L) 11/17/2017 0757   MCH 24.8 (L) 11/17/2017 0757   MCHC 33.6 11/17/2017 0757   RDW 21.3 (H) 11/17/2017 0757  LYMPHSABS 1.9 11/05/2017 0651   MONOABS 1.2 (H) 11/05/2017 0651   EOSABS 0.1 11/05/2017 0651   BASOSABS 0.0 11/05/2017 0651    Dialysis Orders:[facility] M[facility] 4 hours 180 NRe 400/800 69 kg 2.0 K/2.0 Ca  -No heparin -Parsabiv 5mg  IV TIW -Mircera 225 mcg IV q 2 weeks (last dose 10/26/2017 Last HGB 9.1 11/14/17) -Calcitriol 0.5 mcg PO TIW     Assessment/ Plan:   1. SBP vs Ascites: Per primary. CT of abdomenrevealed cirrhosis and large ascites and feels a little better after paracentesis. Rigidity was likely just tense ascites as his abdomen was still tender but soft. On fluconazole and ceftazidime, however cultures from last admission are negative so should be ok to stop antifungals and abx per ID on 11/11/17 but will defer to primary, although still with >2000 WBC from paracentesis yesterday. Afebrile since adm. 2. ESRD - MWF HD today on schedule. K+ 4.6 No heparin 3. Hypertension/volume -Hypertensive at present however also in pain. On dilt and hydralazine. No evidence of volume overload by exam but appears to have lost wt. Mild vascular congestion on CXR 11/15/17. Extra HD session today to help with UF and volume.   4. Anemia -HGB 9.7. No recent ESA for OP Center. Give Aranesp 40 mcg IV today. Follow HGB. 5. Metabolic bone disease - Add renal function panel to today's labs. Continue binders, VDRA. Parsabiv not available on hospital formulary 6. Nutrition -NPO at present. Albumin 2.9 7. Cirrhosis: ammonia level 40. Appears to have ascites-may need paracentesis. Per primary 8. H/O polysubstance abuse 9. H/O medical noncompliance. 10. Vascular access- poorly maturing RUAVF and s/p fistulogram with some occlusion and per VVS to covert to AVG on 11/22/17. 11. Disposition per primary. Hopefully can be discharged today after HD.    Donetta Potts, MD [employer] [phone]

## 2017-11-18 NOTE — Care Management Important Message (Signed)
Important Message  Patient Details  Name: [name] MRN: [id number] Date of Birth: 08/14/1960   Medicare Important Message Given:  Yes    Zenon Mayo, RN 11/18/2017, 3:33 PM

## 2017-11-18 NOTE — Progress Notes (Addendum)
PROGRESS NOTE    [name]  LZJ:[id number] DOB: July 18, 1960 DOA: 11/15/2017 PCP: [name], MD    Brief Narrative:  57 yo male who presented with abdominal pain.  He does have the significant past medical history for alcohol abuse, alcoholic liver cirrhosis with ascites, hypertension, COPD, GERD, polysubstance abuse, hepatitis B and C, end-stage renal disease on hemodialysis.  He recently left the hospital [place] (May 4) while being treated for spontaneous bacterial peritonitis with cephalexin and fluconazole.  He developed worsening abdominal pain, constant, severe intensity sharp in nature, with radiation to his back.  On his initial physical examination blood pressure was 210/97, heart rate 110, respiratory 22, oxygen saturation 98%.  Heart S1-S2 present and rhythmic, his lungs are clear to auscultation, his abdomen was distended, diffusely tender, and with no rebound or guarding, bowel sounds are present, no lower extremity edema.  Sodium 133, potassium 4.6, chloride 93, bicarb 26, glucose 131, BUN 37, creatinine 5.15, AST 26, ALT 20, total bilirubin is 1.1, white count 11.4, hemoglobin 9.7, hematocrit 29.0, platelets 226.  Chest x-ray with cardiomegaly and increase interstitial markings bilaterally.   Patient was admitted to the hospital with a working diagnosis of sepsis due to persistent spontaneous bacterial peritonitis.  Assessment & Plan:   Principal Problem:   SBP (spontaneous bacterial peritonitis) ([place]) Active Problems:   Alcohol abuse   Cigarette smoker   Essential hypertension   COPD (chronic obstructive pulmonary disease) ([place])   History of Cocaine abuse ([place])   Hypertension   Anemia due to chronic kidney disease   ESRD on dialysis ([facility])   Abdominal pain   Liver cirrhosis ([facility])   GERD (gastroesophageal reflux disease)   1. Spontaneous bacterial peritonitis. Patient with persistent abdominal pain, will continue antibiotic therapy with IV  ceftazime and po fluconazole, for a total of 14 days. Patient has been afebrile with no leukocytosis. Fluid cultures with no growth. Persistent abundant wbc. Will increase oxycodone to 10 mg as needed. Old records personally reviewed reported prolonged Qtc, personally reviewed ekg with sinus rhythm, left axis deviation with left bundle branch block with lateral t wave inversions. Corrected Qt at 0.61, Will check Qtc after HD. Fluconazole can prolonged qt in less than 1% per Up to Date reference.   2. Chronic liver failure due to alcoholic liver cirrhosis, complicated by chronic hepatitis. Continue supportive medical therapy, no signs of encephalopathy, will continue neuro checks per unit protocol. INR 1.2   3. HTN. Blood pressure continue to be stable, continue hydralazine and isosorbide.   4. Paroxysmal atrial fibrillation. Will continue rate control with diltiazem, not on anticoagulation due to liver failure and high risk of bleeding.   5. ESRD. Continue HD per nephrology recommendations, patient with no signs of uremia, continue ultrafiltration as tolerated. Non maturing AV fistula at the right arm. Patient schedule to have AV fistula conversion to right upper arm graft on May 14.   DVT prophylaxis: scd  Code Status: full Family Communication: no family at the bedside Disposition Plan:  Home when stable    Consultants:   Nephrology   I.D  Procedures:     Antimicrobials:   Cefazolin   Fluconazole.     Subjective: Patient with persistent abdominal pain, moderate to severe, mild improvement with analgesics, no nausea or vomiting, positive gas. Pain is worse to touch.   Objective: Vitals:   11/18/17 0853 11/18/17 0857 11/18/17 0902 11/18/17 0937  BP: (!) 159/90   (!) 160/89  Pulse: 99 97 (!)  0 (!) 107  Resp: 13 20 (!) 0 18  Temp:    97.6 F (36.4 C)  TempSrc:    Oral  SpO2: 97% 98% (!) 0% 100%  Weight:      Height:        Intake/Output Summary (Last 24 hours) at  11/18/2017 1314 Last data filed at 11/18/2017 0600 Gross per 24 hour  Intake 1200 ml  Output -  Net 1200 ml   Filed Weights   11/17/17 0740 11/17/17 1205 11/18/17 0434  Weight: 73.9 kg (162 lb 14.7 oz) 70.2 kg (154 lb 12.2 oz) 68.9 kg (151 lb 12.8 oz)    Examination:   General: deconditioned and ill looking appearing.  Neurology: Awake and alert, non focal  E ENT: mild pallor, no icterus, oral mucosa moist Cardiovascular: No JVD. S1-S2 present, rhythmic, no gallops, rubs, or murmurs. No lower extremity edema. Pulmonary: decreased breath sounds bilaterally at bases, no wheezing, rhonchi or rales. Gastrointestinal. Abdomen distended and tender to deep palpation, decreased bowel sounds, no organomegaly, non tender, no rebound or guarding Skin. No rashes Musculoskeletal: no joint deformities     Data Reviewed: I have personally reviewed following labs and imaging studies  CBC: Recent Labs  Lab 11/12/17 0737 11/12/17 2155 11/15/17 0919 11/17/17 0757  WBC 15.1* 13.0* 11.4* 10.2  HGB 8.8* 8.8* 9.7* 8.2*  HCT 26.2* 26.0* 29.0* 24.4*  MCV 74.2* 74.3* 74.9* 73.7*  PLT 215 198 226 683   Basic Metabolic Panel: Recent Labs  Lab 11/12/17 0737 11/12/17 2155 11/15/17 0919 11/17/17 0756  NA 127* 130* 133* 131*  K 3.7 5.1 4.6 4.8  CL 83* 93* 93* 91*  CO2 27 25 26 25   GLUCOSE 124* 137* 131* 133*  BUN 57* 32* 37* 58*  CREATININE 6.29* 4.53* 5.15* 7.79*  CALCIUM 9.5 9.0 8.5* 8.0*  PHOS 2.7  --   --  5.0*   GFR: Estimated Creatinine Clearance: 10.4 mL/min (A) (by C-G formula based on SCr of 7.79 mg/dL (H)). Liver Function Tests: Recent Labs  Lab 11/12/17 0737 11/12/17 2155 11/15/17 0919 11/17/17 0756  AST  --  40 26  --   ALT  --  27 20  --   ALKPHOS  --  215* 190*  --   BILITOT  --  0.8 1.1  --   PROT  --  6.7 6.8  --   ALBUMIN 3.1* 3.0* 2.9* 2.8*   Recent Labs  Lab 11/12/17 2155 11/15/17 0919  LIPASE 26 24   Recent Labs  Lab 11/15/17 1918  AMMONIA 40*    Coagulation Profile: Recent Labs  Lab 11/15/17 1841  INR 1.21   Cardiac Enzymes: No results for input(s): CKTOTAL, CKMB, CKMBINDEX, TROPONINI in the last 168 hours. BNP (last 3 results) No results for input(s): PROBNP in the last 8760 hours. HbA1C: No results for input(s): HGBA1C in the last 72 hours. CBG: Recent Labs  Lab 11/16/17 1208 11/18/17 0702 11/18/17 0738  GLUCAP 75 158* 114*   Lipid Profile: No results for input(s): CHOL, HDL, LDLCALC, TRIG, CHOLHDL, LDLDIRECT in the last 72 hours. Thyroid Function Tests: No results for input(s): TSH, T4TOTAL, FREET4, T3FREE, THYROIDAB in the last 72 hours. Anemia Panel: No results for input(s): VITAMINB12, FOLATE, FERRITIN, TIBC, IRON, RETICCTPCT in the last 72 hours.    Radiology Studies: I have reviewed all of the imaging during this hospital visit personally     Scheduled Meds: . bisacodyl  10 mg Rectal Once  . clopidogrel  75  mg Oral Daily  . darbepoetin (ARANESP) injection - DIALYSIS  40 mcg Intravenous Q Fri-HD  . diltiazem  120 mg Oral Daily  . feeding supplement (NEPRO CARB STEADY)  237 mL Oral BID BM  . fluconazole  200 mg Oral Daily  . fluticasone  2 spray Each Nare Daily  . gabapentin  100 mg Oral BID  . heparin injection (subcutaneous)  5,000 Units Subcutaneous Q8H  . hydrALAZINE  10 mg Oral BID  . isosorbide mononitrate  30 mg Oral Daily  . lactulose  20 g Oral BID  . loratadine  10 mg Oral Daily  . mometasone-formoterol  2 puff Inhalation BID  . nicotine  21 mg Transdermal Daily  . pantoprazole  40 mg Oral Daily  . sevelamer carbonate  3,200 mg Oral TID WC  . tiotropium  18 mcg Inhalation Daily   Continuous Infusions: . cefTAZidime (FORTAZ)  IV 2 g (11/16/17 1809)     LOS: 3 days        Aspen Lawrance Gerome Apley, MD [facility] Pager [phone]

## 2017-11-18 NOTE — Plan of Care (Addendum)
  Problem: Nutrition: Goal: Adequate nutrition will be maintained Outcome: Not Progressing   Problem: Coping: Goal: Level of anxiety will decrease Outcome: Not Progressing   Problem: Pain Managment: Goal: General experience of comfort will improve Outcome: Not Progressing  Patient continues to refused heparin and  suppository.     Continues to have abdominal pain 10/10.  Was able to have 3 small loose stools.  Will continue to monitor

## 2017-11-18 NOTE — Progress Notes (Signed)
Call to patient instructed to hold [facility] for surgery and that expect call from [place] on Monday for admit to hospital on Monday night for surgery on Tuesday morning. Patient verbalized understanding.  Spoke to [place] in bed control to arrange admission for 11/21/17.

## 2017-11-18 NOTE — Op Note (Signed)
Procedure: Ultrasound right arm AV fistula, right arm fistulogram  Preoperative diagnosis: Non-maturing AV fistula right arm new  Postoperative diagnosis: Same  Anesthesia: Local  Operative findings: Patent proximal 4 cm of fistula followed by large abundant collateralization and a 4 cm segment of occluded cephalic vein which reconstitutes via collaterals  Operative details: After obtaining informed consent, the patient was taken to the [facility] lab.  The patient was placed in supine position Angio table.  Patient's right upper extremity was prepped and draped in usual sterile fashion.  Ultrasound was used to identify the patient's right upper arm AV fistula.  An image was taken for part of the permanent record.  A micropuncture needle was then used to cannulate the AV fistula under ultrasound guidance.  The micropuncture wire was advanced into the fistula.  The micropuncture sheath was advanced over this.  Contrast angiogram was then including the innominate and right subclavian vein are all widely patent.  The axillary vein is patent.  The distal cephalic vein is patent and enters the cephalic arch in a normal position.  The proximal 4 cm of the fistula is patent and well-developed with abundant number of collaterals coming off of this.  The fistula occludes for about 4 cm and then the cephalic vein reconstitutes distally.  Blood pressure cuff was inflated on the upper arm and contrast reflux across the arterial anastomosis which showed it was widely patent.  At this point a Monocryl stitch was placed at the sheath exit site the sheath was removed and hemostasis obtained with direct pressure.  The patient tolerated procedure well and there were complications.  Patient was taken to the holding area in stable condition.  Operative management: The patient will be scheduled for conversion of his AV fistula to a right upper arm AV graft on Tuesday, May 14.  He will be a 730 start in the OR.  He is requesting  admission the night before due to transportation issues.  Our office will try to arrange this.  He should be able to discharge on the day of operation on Tuesday.  Ruta Hinds, MD [facility] Office: [phone] Pager: [phone]

## 2017-11-18 NOTE — Interval H&P Note (Signed)
History and Physical Interval Note:  11/18/2017 8:15 AM  [name]  has presented today for surgery, with the diagnosis of poor flow in fistula  The various methods of treatment have been discussed with the patient and family. After consideration of risks, benefits and other options for treatment, the patient has consented to  Procedure(s): A/V FISTULAGRAM - Right Arm (N/A) as a surgical intervention .  The patient's history has been reviewed, patient examined, no change in status, stable for surgery.  I have reviewed the patient's chart and labs.  Questions were answered to the patient's satisfaction.     Ruta Hinds

## 2017-11-18 NOTE — Progress Notes (Signed)
Pharmacy Antibiotic Note  [name] is a 57 y.o. male admitted on 11/15/2017 with SBP.  Pharmacy has been consulted for ceftazidime/fluconazole dosing. ESRD on HD MWF. He received an extra HD session on 5/9 and received post-HD antibiotic doses appropriately.  He is to receive HD today on schedule.  Plan: Ceftazidime 2g IV qMWF at 1800 Fluconazole 200mg  PO q24h at 1800 Monitor clinical progress, c/s, liver function tests on fluconzole, abx plan/LOT F/u HD schedule/tolerance inpatient   Height: 5\' 9"  (175.3 cm) Weight: 151 lb 12.8 oz (68.9 kg) IBW/kg (Calculated) : 70.7  Temp (24hrs), Avg:98.8 F (37.1 C), Min:97.6 F (36.4 C), Max:99.3 F (37.4 C)  Recent Labs  Lab 11/12/17 0737 11/12/17 2155 11/15/17 0919 11/15/17 1853 11/15/17 2246 11/17/17 0756 11/17/17 0757  WBC 15.1* 13.0* 11.4*  --   --   --  10.2  CREATININE 6.29* 4.53* 5.15*  --   --  7.79*  --   LATICACIDVEN  --   --   --  0.98 1.3  --   --     Estimated Creatinine Clearance: 10.4 mL/min (A) (by C-G formula based on SCr of 7.79 mg/dL (H)).    Allergies  Allergen Reactions  . Aspirin Other (See Comments)    STOMACH PAIN  . Tylenol [Acetaminophen]     Stomach ache  . Clonidine Derivatives Itching  . Tramadol Itching   Legrand Como, Pharm.D., BCPS, BCIDP Clinical Pharmacist Phone: [phone] 11/18/2017, 10:09 AM

## 2017-11-19 DIAGNOSIS — F1010 Alcohol abuse, uncomplicated: Secondary | ICD-10-CM

## 2017-11-19 DIAGNOSIS — K219 Gastro-esophageal reflux disease without esophagitis: Secondary | ICD-10-CM

## 2017-11-19 LAB — CBC WITH DIFFERENTIAL/PLATELET
Basophils Absolute: 0 10*3/uL (ref 0.0–0.1)
Basophils Relative: 0 %
Eosinophils Absolute: 0.1 10*3/uL (ref 0.0–0.7)
Eosinophils Relative: 1 %
HCT: 23.8 % — ABNORMAL LOW (ref 39.0–52.0)
Hemoglobin: 7.9 g/dL — ABNORMAL LOW (ref 13.0–17.0)
Lymphocytes Relative: 10 %
Lymphs Abs: 1.1 10*3/uL (ref 0.7–4.0)
MCH: 24.5 pg — ABNORMAL LOW (ref 26.0–34.0)
MCHC: 33.2 g/dL (ref 30.0–36.0)
MCV: 73.9 fL — ABNORMAL LOW (ref 78.0–100.0)
Monocytes Absolute: 1.7 10*3/uL — ABNORMAL HIGH (ref 0.1–1.0)
Monocytes Relative: 15 %
Neutro Abs: 8.5 10*3/uL — ABNORMAL HIGH (ref 1.7–7.7)
Neutrophils Relative %: 74 %
Platelets: 141 10*3/uL — ABNORMAL LOW (ref 150–400)
RBC: 3.22 MIL/uL — ABNORMAL LOW (ref 4.22–5.81)
RDW: 21.1 % — ABNORMAL HIGH (ref 11.5–15.5)
WBC: 11.4 10*3/uL — ABNORMAL HIGH (ref 4.0–10.5)

## 2017-11-19 LAB — RENAL FUNCTION PANEL
Albumin: 2.6 g/dL — ABNORMAL LOW (ref 3.5–5.0)
Anion gap: 13 (ref 5–15)
BUN: 34 mg/dL — ABNORMAL HIGH (ref 6–20)
CO2: 27 mmol/L (ref 22–32)
Calcium: 8.6 mg/dL — ABNORMAL LOW (ref 8.9–10.3)
Chloride: 92 mmol/L — ABNORMAL LOW (ref 101–111)
Creatinine, Ser: 6.65 mg/dL — ABNORMAL HIGH (ref 0.61–1.24)
GFR calc Af Amer: 10 mL/min — ABNORMAL LOW (ref >60)
GFR calc non Af Amer: 8 mL/min — ABNORMAL LOW (ref >60)
Glucose, Bld: 111 mg/dL — ABNORMAL HIGH (ref 65–99)
Phosphorus: 4.1 mg/dL (ref 2.5–4.6)
Potassium: 4.2 mmol/L (ref 3.5–5.1)
Sodium: 132 mmol/L — ABNORMAL LOW (ref 135–145)

## 2017-11-19 LAB — GLUCOSE, CAPILLARY
Glucose-Capillary: 106 mg/dL — ABNORMAL HIGH (ref 65–99)
Glucose-Capillary: 117 mg/dL — ABNORMAL HIGH (ref 65–99)

## 2017-11-19 MED ORDER — LACTULOSE 10 GM/15ML PO SOLN: 20.0000 g | Freq: Two times a day (BID) | ORAL | 0 refills | Status: DC

## 2017-11-19 MED ORDER — CEFTAZIDIME 2 G IV SOLR: 2.0000 g | INTRAVENOUS | 0 refills | Status: DC

## 2017-11-19 MED ORDER — OXYCODONE HCL 5 MG PO TABS: 5.0000 mg | ORAL_TABLET | Freq: Four times a day (QID) | ORAL | 0 refills | Status: DC | PRN

## 2017-11-19 MED ORDER — DARBEPOETIN ALFA 40 MCG/0.4ML IJ SOSY
PREFILLED_SYRINGE | INTRAMUSCULAR | Status: AC
  Administered 2017-11-19: 40 ug via INTRAVENOUS
  Filled 2017-11-19: qty 0.4

## 2017-11-19 NOTE — Progress Notes (Signed)
Pt informed and agreed to be discharged. Pt alert and oriented. Vital signs stable on room air. Pt was seen and evaluated by physical therapist before being discharged. Removed pt IV. Pt given prescription and went over discharge paperwork with pt. Pt wheeled off unit by nurse.

## 2017-11-19 NOTE — Discharge Summary (Addendum)
Physician Discharge Summary  [name] EHM:[id number] DOB: [birth year] DOA: 11/15/2017  PCP: [name], MD  Admit date: 11/15/2017 Discharge date: 11/19/2017  Admitted From: Home Disposition:  Home   Recommendations for Outpatient Follow-up and new medication changes:  1. Follow up with PCP in 1- week 2. Patient will continue IV cefepime on HD last dose 11/23/2017 3. Ascitic fluid has been now growth, will hold on Fluconazole due to prolonged Qt interval. 4. Patient will return May 13 for vascular procedure on May 14.  5. Prescribed 8 tablets of oxycodene to take 5 mg as needed every 6 hours. (presecribed 8 tablets). Last time he received oxycodone was May 11/2013 (10 tablets) and before that on 10/18/2017.   Home Health: Yes  Equipment/Devices: no    Discharge Condition: stable  CODE STATUS: full  Diet recommendation:  Heart healthy, renal dialysis prudent.   Brief/Interim Summary: 57 yo male who presented with abdominal pain.  He does have the significant past medical history for alcohol abuse, alcoholic liver cirrhosis with ascites, hypertension, COPD, GERD, polysubstance abuse, hepatitis B and C, end-stage renal disease on hemodialysis.  He recently left the hospital [place] (May 4) while being treated for spontaneous bacterial peritonitis with cephalexin and fluconazole.  He developed worsening abdominal pain, constant, severe intensity sharp in nature, with radiation to his back.  On his initial physical examination blood pressure was 210/97, heart rate 110, respiratory rate 22, oxygen saturation 98%.  Heart S1-S2 present and rhythmic, his lungs were clear to auscultation, his abdomen was distended, diffusely tender, and with no rebound or guarding, bowel sounds were present, no lower extremity edema.  Sodium 133, potassium 4.6, chloride 93, bicarb 26, glucose 131, BUN 37, creatinine 5.15, AST 26, ALT 20, total bilirubin is 1.1, white count 11.4, hemoglobin  9.7, hematocrit 29.0, platelets 226.  Chest x-ray with cardiomegaly and increase interstitial markings bilaterally.   Patient was admitted to the hospital with a working diagnosis of sepsis due to persistent spontaneous bacterial peritonitis.  1.  Persistent spontaneous bacterial peritonitis complicated by sepsis (present on admission).  Patient was readmitted to the medical ward, he was placed on a remote telemetry monitor, antibiotic therapy was resumed with cefepime on hemodialysis plus oral fluconazole.  He had a repeat paracentesis obtaining 2000 mL of ascitic fluid, cultures have remained no growth, ascitic fluid white cell count 2075, with 75% neutrophils. He received analgesics and as needed IV antiemetics. Patient responded well to medical therapy, his abdominal pain improved, he will be discharged home with home health services.  Antibiotic therapy with IV cefazolin, last dose May 15.  His cultures remain no growth.  Patient did have a persistent prolonged QT, fluconazole has been discontinued.   2.  Chronic liver failure due to alcoholic liver cirrhosis, complicated by chronic hepatitis B and C.  No signs of acute decompensation, his INR remained at 1.2, no encephalopathy.  Patient will need close follow-up as an outpatient.  3.  Hypertension.  Blood pressure remained well controlled, continue hydralazine and isosorbide.  4.  Paroxysmal atrial fibrillation.  Heart rate control with diltiazem, no anticoagulation due to high risk of bleeding related to his advanced chronic liver failure. Continue on clopidogrel daily.   5.  End-stage renal disease on hemodialysis.  Patient received hemodialysis through tunneled catheter.  He had a non-maturing AV fistula at his right arm, he was seen in the vascular surgery, scheduled to hemodialysis access conversion to right upper extremity graft on May 14.  Discharge Diagnoses:  Principal Problem:   SBP (spontaneous bacterial peritonitis)  ([place]) Active Problems:   Alcohol abuse   Cigarette smoker   Essential hypertension   COPD (chronic obstructive pulmonary disease) ([place])   History of Cocaine abuse ([place])   Hypertension   Anemia due to chronic kidney disease   ESRD on dialysis [facility])   Abdominal pain   Liver cirrhosis ([facility])   GERD (gastroesophageal reflux disease)    Discharge Instructions   Allergies as of 11/19/2017      Reactions   Aspirin Other (See Comments)   STOMACH PAIN   Tylenol [acetaminophen]    Stomach ache   Clonidine Derivatives Itching   Tramadol Itching      Medication List    STOP taking these medications   ALPRAZolam 0.25 MG tablet Commonly known as:  XANAX   guaiFENesin 100 MG/5ML Soln Commonly known as:  ROBITUSSIN   nicotine 21 mg/24hr patch Commonly known as:  NICODERM CQ - dosed in mg/24 hours     TAKE these medications   budesonide-formoterol 80-4.5 MCG/ACT inhaler Commonly known as:  SYMBICORT Inhale 2 puffs into the lungs 2 (two) times daily.   Ceftazidime 2 g Solr injection Commonly known as:  FORTAZ Inject 2 g into the vein every other day. To receive on hemodialysis, last dose 11/23/2017.   clopidogrel 75 MG tablet Commonly known as:  PLAVIX Take 1 tablet (75 mg total) by mouth daily.   diltiazem 120 MG 24 hr capsule Commonly known as:  CARDIZEM CD Take 1 capsule (120 mg total) by mouth daily.   famotidine 20 MG tablet Commonly known as:  PEPCID One at bedtime What changed:    how much to take  how to take this  when to take this  additional instructions   feeding supplement (NEPRO CARB STEADY) Liqd Take 237 mLs by mouth 2 (two) times daily between meals.   fluticasone 50 MCG/ACT nasal spray Commonly known as:  FLONASE Place 2 sprays into both nostrils daily.   gabapentin 100 MG capsule Commonly known as:  NEURONTIN Take 1 capsule (100 mg total) by mouth at bedtime. What changed:  when to take this   hydrALAZINE 100 MG tablet Commonly known  as:  APRESOLINE Take 1 tablet by mouth twice a day take after HD on dialysis days   isosorbide mononitrate 30 MG 24 hr tablet Commonly known as:  IMDUR Take 1 tablet (30 mg total) by mouth daily. What changed:  additional instructions   lactulose 10 GM/15ML solution Commonly known as:  CHRONULAC Take 30 mLs (20 g total) by mouth 2 (two) times daily.   loratadine 10 MG tablet Commonly known as:  CLARITIN Take 1 tablet (10 mg total) by mouth daily.   oxyCODONE 5 MG immediate release tablet Commonly known as:  ROXICODONE Take 1 tablet (5 mg total) by mouth every 6 (six) hours as needed for severe pain. What changed:  when to take this   pantoprazole 40 MG tablet Commonly known as:  PROTONIX Take 1 tablet (40 mg total) by mouth daily. Take 30-60 min before first meal of the day   sevelamer carbonate 800 MG tablet Commonly known as:  RENVELA Take 1,600-3,200 mg by mouth See admin instructions. Take 4 tablets (800mg  each) three times daily and 2 tablets twice daily with a snack.   tiotropium 18 MCG inhalation capsule Commonly known as:  SPIRIVA Place 18 mcg into inhaler and inhale daily.   VENTOLIN HFA 108 (90 Base) MCG/ACT  inhaler Generic drug:  albuterol Inhale 2 puffs into the lungs every 6 (six) hours as needed for wheezing or shortness of breath.       Allergies  Allergen Reactions  . Aspirin Other (See Comments)    STOMACH PAIN  . Tylenol [Acetaminophen]     Stomach ache  . Clonidine Derivatives Itching  . Tramadol Itching    Consultations:  Nephrology   Vascular surgery   Procedures/Studies: Dg Chest 2 View  Result Date: 11/15/2017 CLINICAL DATA:  Abdominal distention with productive cough. EXAM: CHEST - 2 VIEW COMPARISON:  None. FINDINGS: The heart is enlarged. Dialysis catheter tips project over the RIGHT atrium. Mild vascular congestion without consolidation or edema. No effusion or pneumothorax. No acute osseous findings. IMPRESSION: Cardiomegaly. Mild  vascular congestion. No frank edema or consolidation. Electronically Signed   By: Staci Righter M.D.   On: 11/15/2017 19:37   Dg Chest 2 View  Result Date: 10/27/2017 CLINICAL DATA:  Sharp abdominal pain for 3 days.  Dyspnea. EXAM: CHEST - 2 VIEW COMPARISON:  10/03/2017 FINDINGS: Stable cardiomegaly with aortic atherosclerosis. Dialysis catheter tip from right IJ approach terminates in the mid right atrium. Interstitial edema is noted with left basilar atelectasis and chronic elevation of the left hemidiaphragm. No acute nor suspicious osseous abnormality. IMPRESSION: 1. Stable cardiomegaly with mild interstitial pulmonary edema, slightly worse than on comparison. No effusion or pneumothorax. 2. Aortic atherosclerosis without aneurysm. Electronically Signed   By: Ashley Royalty M.D.   On: 10/27/2017 18:45   Ct Abdomen Pelvis W Contrast  Result Date: 11/16/2017 CLINICAL DATA:  Chronic periumbilical abdominal pain. Loss of appetite. Dialysis patient. EXAM: CT ABDOMEN AND PELVIS WITH CONTRAST TECHNIQUE: Multidetector CT imaging of the abdomen and pelvis was performed using the standard protocol following bolus administration of intravenous contrast. CONTRAST:  56mL OMNIPAQUE IOHEXOL 300 MG/ML  SOLN COMPARISON:  10/25/2017 FINDINGS: Lower chest: Cardiomegaly.  Lung bases are clear.  No pleural fluid. Hepatobiliary: Cirrhosis as manifest by lobulation of the liver surface and enlargement of the left lobe and caudate lobe. No definable focal mass. No calcified gallstones. Portal vein shows flow. Pancreas: Normal Spleen: Normal size.  No focal lesion. Adrenals/Urinary Tract: No adrenal mass. Both kidneys are small with extensive vascular calcification and a 1 cm cyst at the lower pole on the left. Stomach/Bowel: No bowel pathology discernible. No evidence of obstruction. Clip in the region of the rectum as seen previously as distant as 09/02/2017 Vascular/Lymphatic: Advanced atherosclerosis of the aorta and its branch  vessels. No aneurysm. No retroperitoneal mass or adenopathy. Reproductive: Negative Other: Large amount of ascites freely distributed throughout the peritoneal cavity. Musculoskeletal: Chronic degenerative disc disease at L5-S1. Chronic bone changes of renal failure. IMPRESSION: Large amount of ascites, freely distributed. Cirrhosis of the liver without evidence of focal or acute finding. Cardiomegaly. Advanced aortic atherosclerosis and atherosclerosis of the branch vessels. Electronically Signed   By: Nelson Chimes M.D.   On: 11/16/2017 11:52   Ct Abdomen Pelvis W Contrast  Result Date: 10/25/2017 CLINICAL DATA:  Acute abdominal pain, generalized. EXAM: CT ABDOMEN AND PELVIS WITH CONTRAST TECHNIQUE: Multidetector CT imaging of the abdomen and pelvis was performed using the standard protocol following bolus administration of intravenous contrast. CONTRAST:  15mL ISOVUE-300 IOPAMIDOL (ISOVUE-300) INJECTION 61% COMPARISON:  09/28/2017 FINDINGS: Lower chest: Cardiomegaly. Central line with tip in the right atrium. Overall small pericardial effusion, stable. Extensive atherosclerotic calcification of the coronaries and aorta. Hepatobiliary: Lobulated liver surface and large caudate lobe suggesting cirrhosis.  No focal mass. Patent portal vein.No evidence of biliary obstruction or stone. Pancreas: Stable Spleen: Unremarkable. Adrenals/Urinary Tract: Negative adrenals. Marked bilateral renal atrophy with small cystic densities. Patient has history of end-stage renal disease. Unremarkable bladder. Stomach/Bowel: No obstruction. No visible inflammation. There is a clip noted within the rectum. Vascular/Lymphatic: No acute vascular abnormality. Severe atherosclerosis with confluent calcification along the aorta and visceral branches. No mass or adenopathy. Reproductive:No pathologic findings. Other: There is a large volume ascites without visible loculation. Chronic peritoneal thickening in the pelvis, seen since at least  04/2017 Musculoskeletal: Generalized bony sclerosis, likely renal osteodystrophy. IMPRESSION: 1. No acute finding. 2. Large volume ascites. Chronic peritoneal thickening in the pelvis, please correlate with recent paracentesis results. 3. Suspected cirrhosis. 4.  Aortic Atherosclerosis (ICD10-I70.0).  Coronary atherosclerosis. Electronically Signed   By: Monte Fantasia M.D.   On: 10/25/2017 09:49   Dg Chest Port 1 View  Result Date: 11/08/2017 CLINICAL DATA:  Shortness of breath.  Smoker EXAM: PORTABLE CHEST 1 VIEW COMPARISON:  11/04/2017 FINDINGS: Decreased inspiration. Stable enlarged cardiac silhouette. The right jugular double-lumen catheter tips remain in the right atrium, currently in the mid to lower right atrium. Stable prominence of the interstitial markings and mild prominence of the pulmonary vasculature. Minimal left basilar linear atelectasis and possible small left pleural effusion. Diffuse osteopenia. Atheromatous arterial calcifications. IMPRESSION: 1. Decreased inspiration with stable mild elevation of the left hemidiaphragm and interval minimal left basilar linear atelectasis or scarring and possible small left pleural effusion. 2. Stable cardiomegaly, mild pulmonary vascular congestion and mild chronic interstitial lung disease. Electronically Signed   By: Claudie Revering M.D.   On: 11/08/2017 15:38   Dg Chest Port 1 View  Result Date: 11/04/2017 CLINICAL DATA:  Productive cough with generalized weakness and dyspnea. EXAM: PORTABLE CHEST 1 VIEW COMPARISON:  10/29/2017 FINDINGS: Stable cardiomegaly with aortic atherosclerosis. Mild interstitial edema. No pulmonary consolidation. Left basilar atelectasis is identified with slight elevation of the left hemidiaphragm, chronic in appearance. Right IJ dialysis catheter tip remains in the mid right atrium. No acute osseous abnormality. IMPRESSION: Cardiomegaly with mild interstitial edema. Aortic atherosclerosis. Dialysis catheter tip in the mid  right atrium. Electronically Signed   By: Ashley Royalty M.D.   On: 11/04/2017 03:23   Dg Chest Port 1 View  Result Date: 10/29/2017 CLINICAL DATA:  Chest pain and shortness of breath for 1 day. EXAM: PORTABLE CHEST 1 VIEW COMPARISON:  10/27/2017. FINDINGS: Stable enlarged cardiac silhouette. The right jugular catheter tip is in the right atrium. The left hemidiaphragm remains elevated. Clear lungs with normal vascularity. IMPRESSION: 1. The right jugular catheter tip remains in the mid right atrium. 2. Stable cardiomegaly and elevation of the left hemidiaphragm. Electronically Signed   By: Claudie Revering M.D.   On: 10/29/2017 11:31   Ir Abdomen US Limited  Result Date: 11/09/2017 CLINICAL DATA:  History of cirrhosis with recurrent symptomatic ascites. Please perform ultrasound-guided paracentesis for therapeutic purposes. EXAM: LIMITED ABDOMEN ULTRASOUND FOR ASCITES TECHNIQUE: Limited ultrasound survey for ascites was performed in all four abdominal quadrants. COMPARISON:  Ascites search ultrasound-11/04/2017; ultrasound-guided paracentesis-10/28/2017; CT abdomen and pelvis - 10/25/2017 FINDINGS: Sonographic evaluation of the abdomen demonstrates a small amount of predominantly perihepatic intra-abdominal ascites. While the amount of fluid is likely amenable to ultrasound-guided paracentesis, the patient refused the procedure and as such, no procedure was performed. IMPRESSION: Small amount of intra-abdominal ascites, potentially amenable to ultrasound-guided paracentesis however the present time, the patient refuses to undergo the paracentesis Referring physician  was made aware of patient's request. Electronically Signed   By: Sandi Mariscal M.D.   On: 11/09/2017 10:20   Ir Abdomen US Limited  Result Date: 11/04/2017 CLINICAL DATA:  Abdominal pain, recurrent abdominal ascites. Paracentesis requested. EXAM: LIMITED ABDOMEN ULTRASOUND FOR ASCITES TECHNIQUE: Limited ultrasound survey for ascites was performed in  all four abdominal quadrants. COMPARISON:  10/28/2017 FINDINGS: Survey ultrasound demonstrates a small amount of scattered abdominal ascites. No large pocket to allow safe therapeutic paracentesis. IMPRESSION: Small amount of scattered abdominal ascites. Therapeutic paracentesis deferred. Electronically Signed   By: Lucrezia Europe M.D.   On: 11/04/2017 09:44   Ir Paracentesis  Result Date: 11/16/2017 INDICATION: Recurrent ascites EXAM: ULTRASOUND-GUIDED PARACENTESIS COMPARISON:  Previous paracentesis. MEDICATIONS: 10 cc 2% lidocaine. COMPLICATIONS: None immediate. TECHNIQUE: Informed written consent was obtained from the patient after a discussion of the risks, benefits and alternatives to treatment. A timeout was performed prior to the initiation of the procedure. Initial ultrasound scanning demonstrates a large amount of ascites within the left lower abdominal quadrant. The right lower abdomen was prepped and draped in the usual sterile fashion. 1% lidocaine with epinephrine was used for local anesthesia. Under direct ultrasound guidance, a 19 gauge, 7-cm, Yueh catheter was introduced. An ultrasound image was saved for documentation purposed. The paracentesis was performed. The catheter was removed and a dressing was applied. The patient tolerated the procedure well without immediate post procedural complication. FINDINGS: A total of approximately 2.1 liters of yellow fluid was removed. Samples were sent to the laboratory as requested by the clinical team. IMPRESSION: Successful ultrasound-guided paracentesis yielding 2.1 liters of peritoneal fluid. Read by Lavonia Drafts [facility] Electronically Signed   By: Marybelle Killings M.D.   On: 11/16/2017 15:48   Ir Paracentesis  Result Date: 11/09/2017 INDICATION: Recurrent symptomatic ascites. Please perform ultrasound-guided paracentesis for diagnostic and therapeutic purposes. EXAM: ULTRASOUND-GUIDED PARACENTESIS COMPARISON:  Ultrasound-guided paracentesis - 10/28/2017 CT  abdomen and pelvis - 10/25/2017 MEDICATIONS: None. COMPLICATIONS: None immediate. TECHNIQUE: Informed written consent was obtained from the patient after a discussion of the risks, benefits and alternatives to treatment. A timeout was performed prior to the initiation of the procedure. Initial ultrasound scanning demonstrates a small to moderate amount of ascites within the right lower abdominal quadrant. The right lower abdomen was prepped and draped in the usual sterile fashion. 1% lidocaine with epinephrine was used for local anesthesia. Under direct ultrasound guidance, a 19 gauge, 7-cm, Yueh catheter was introduced. An ultrasound image was saved for documentation purposed. The paracentesis was performed. The catheter was removed and a dressing was applied. The patient tolerated the procedure well without immediate post procedural complication. FINDINGS: A total of approximately 3 liters of serous fluid was removed. Samples were sent to the laboratory as requested by the clinical team. IMPRESSION: Successful ultrasound-guided paracentesis yielding 3 liters of peritoneal fluid. Electronically Signed   By: Sandi Mariscal M.D.   On: 11/09/2017 15:43   Ir Paracentesis  Result Date: 10/28/2017 INDICATION: Patient with recurrent ascites. Request is made for diagnostic and therapeutic paracentesis. EXAM: ULTRASOUND GUIDED DIAGNOSTIC AND THERAPEUTIC PARACENTESIS MEDICATIONS: 10 mL 2% lidocaine COMPLICATIONS: None immediate. PROCEDURE: Informed written consent was obtained from the patient after a discussion of the risks, benefits and alternatives to treatment. A timeout was performed prior to the initiation of the procedure. Initial ultrasound scanning demonstrates a small amount of ascites within the left lateral abdomen. The left lateral abdomen was prepped and draped in the usual sterile fashion. 2% lidocaine was used  for local anesthesia. Following this, a 19 gauge, 7-cm, Yueh catheter was introduced. An ultrasound  image was saved for documentation purposes. The paracentesis was performed. The catheter was removed and a dressing was applied. The patient tolerated the procedure well without immediate post procedural complication. FINDINGS: A total of approximately 5 mL of amber fluid was removed. Samples were sent to the laboratory as requested by the clinical team. IMPRESSION: Successful ultrasound-guided diagnostic and therapeutic paracentesis yielding 5 mL liters of peritoneal fluid. Read by: Brynda Greathouse PA-C Electronically Signed   By: Sandi Mariscal M.D.   On: 10/28/2017 12:07       Subjective: Patient is feeling better, abdominal pain is improved with analgesics, no nausea or vomiting, no chest pain or dyspnea.   Discharge Exam: Vitals:   11/19/17 0752 11/19/17 0800  BP: (!) 154/81   Pulse: (!) 101   Resp: (!) 21   Temp:  97.6 F (36.4 C)  SpO2: 95%    Vitals:   11/19/17 0530 11/19/17 0542 11/19/17 0752 11/19/17 0800  BP: 136/80 135/75 (!) 154/81   Pulse: 79 98 (!) 101   Resp:  19 (!) 21   Temp:  99.4 F (37.4 C)  97.6 F (36.4 C)  TempSrc:  Oral  Oral  SpO2:  95% 95%   Weight:  68.5 kg (151 lb 0.2 oz)    Height:        General: Not in pain or dyspnea, deconditioned Neurology: Awake and alert, non focal  E ENT: mild pallor, no icterus, oral mucosa moist Cardiovascular: No JVD. S1-S2 present, rhythmic, no gallops, rubs, or murmurs. No lower extremity edema. Pulmonary: vesicular breath sounds bilaterally, adequate air movement, no wheezing, rhonchi or rales. Gastrointestinal. Abdomen distended and tender to deep palpation,  no organomegaly, no rebound or guarding Skin. No rashes Musculoskeletal: no joint deformities   The results of significant diagnostics from this hospitalization (including imaging, microbiology, ancillary and laboratory) are listed below for reference.     Microbiology: Recent Results (from the past 240 hour(s))  Gram stain     Status: None   Collection  Time: 11/09/17  2:41 PM  Result Value Ref Range Status   Specimen Description PERITONEAL  Final   Special Requests NONE  Final   Gram Stain   Final    ABUNDANT WBC PRESENT,BOTH PMN AND MONONUCLEAR NO ORGANISMS SEEN Performed at [facility], [address], [place], [place] [address]    Report Status 11/09/2017 FINAL  Final  Culture, body fluid-bottle     Status: None   Collection Time: 11/09/17  2:41 PM  Result Value Ref Range Status   Specimen Description PERITONEAL  Final   Special Requests NONE  Final   Culture   Final    NO GROWTH 5 DAYS Performed at [facility] [address] [place], [place] [address]    Report Status 11/14/2017 FINAL  Final  Culture, blood (x 2)     Status: None (Preliminary result)   Collection Time: 11/15/17 10:54 PM  Result Value Ref Range Status   Specimen Description BLOOD LEFT ARM  Final   Special Requests   Final    BOTTLES DRAWN AEROBIC AND ANAEROBIC Blood Culture adequate volume   Culture   Final    NO GROWTH 3 DAYS Performed at [facility], [place] [address], [place], [place] [address]    Report Status PENDING  Incomplete  Culture, blood (x 2)     Status:  None (Preliminary result)   Collection Time: 11/15/17 11:55 PM  Result Value Ref Range Status   Specimen Description BLOOD RIGHT FOREARM  Final   Special Requests   Final    BOTTLES DRAWN AEROBIC AND ANAEROBIC Blood Culture adequate volume   Culture   Final    NO GROWTH 3 DAYS Performed at [facility], [address] [place], [place] [address]    Report Status PENDING  Incomplete  Body fluid culture     Status: None (Preliminary result)   Collection Time: 11/16/17  3:41 PM  Result Value Ref Range Status   Specimen Description PERITONEAL CAVITY  Final   Special Requests NONE  Final   Gram Stain   Final    ABUNDANT WBC PRESENT, PREDOMINANTLY PMN NO ORGANISMS SEEN    Culture   Final    NO GROWTH 3 DAYS Performed at [facility], [place] [address] [place], [place] [address]    Report Status PENDING  Incomplete  MRSA PCR Screening     Status: None   Collection Time: 11/17/17  2:27 AM  Result Value Ref Range Status   MRSA by PCR NEGATIVE NEGATIVE Final    Comment:        The GeneXpert MRSA Assay (FDA approved for NASAL specimens only), is one component of a comprehensive MRSA colonization surveillance program. It is not intended to diagnose MRSA infection nor to guide or monitor treatment for MRSA infections. Performed at [facility], [place] [address], [place], [place] [address]      Labs: BNP (last 3 results) Recent Labs    07/08/17 1959 08/20/17 1455 08/29/17 1235  BNP 1,100.4* 1,418.4* 427.0*   Basic Metabolic Panel: Recent Labs  Lab 11/12/17 2155 11/15/17 0919 11/17/17 0756 11/19/17 0220  NA 130* 133* 131* 132*  K 5.1 4.6 4.8 4.2  CL 93* 93* 91* 92*  CO2 25 26 25 27   GLUCOSE 137* 131* 133* 111*  BUN 32* 37* 58* 34*  CREATININE 4.53* 5.15* 7.79* 6.65*  CALCIUM 9.0 8.5* 8.0* 8.6*  PHOS  --   --  5.0* 4.1   Liver Function Tests: Recent Labs  Lab 11/12/17 2155 11/15/17 0919 11/17/17 0756 11/19/17 0220  AST 40 26  --   --   ALT 27 20  --   --   ALKPHOS 215* 190*  --   --   BILITOT 0.8 1.1  --   --   PROT 6.7 6.8  --   --   ALBUMIN 3.0* 2.9* 2.8* 2.6*   Recent Labs  Lab 11/12/17 2155 11/15/17 0919  LIPASE 26 24   Recent Labs  Lab 11/15/17 1918  AMMONIA 40*   CBC: Recent Labs  Lab 11/12/17 2155 11/15/17 0919 11/17/17 0757 11/19/17 0243  WBC 13.0* 11.4* 10.2 11.4*  NEUTROABS  --   --   --  8.5*  HGB 8.8* 9.7* 8.2* 7.9*  HCT 26.0* 29.0* 24.4* 23.8*  MCV 74.3* 74.9* 73.7* 73.9*  PLT 198 226 151 141*   Cardiac Enzymes: No results for input(s): CKTOTAL, CKMB, CKMBINDEX, TROPONINI in the last 168 hours. BNP: Invalid input(s): POCBNP CBG: Recent Labs  Lab 11/16/17 1208 11/18/17 0702 11/18/17 0738 11/19/17 0630 11/19/17 0748  GLUCAP 75 158* 114* 106* 117*   D-Dimer No  results for input(s): DDIMER in the last 72 hours. Hgb A1c No results for input(s): HGBA1C in the last 72 hours. Lipid Profile No results for input(s): CHOL, HDL, LDLCALC,  TRIG, CHOLHDL, LDLDIRECT in the last 72 hours. Thyroid function studies No results for input(s): TSH, T4TOTAL, T3FREE, THYROIDAB in the last 72 hours.  Invalid input(s): FREET3 Anemia work up No results for input(s): VITAMINB12, FOLATE, FERRITIN, TIBC, IRON, RETICCTPCT in the last 72 hours. Urinalysis    Component Value Date/Time   COLORURINE YELLOW 07/16/2011 1619   APPEARANCEUR CLEAR 07/16/2011 1619   LABSPEC 1.018 07/16/2011 1619   PHURINE 7.5 07/16/2011 1619   GLUCOSEU 250 (A) 07/16/2011 1619   HGBUR MODERATE (A) 07/16/2011 1619   BILIRUBINUR SMALL (A) 07/16/2011 1619   KETONESUR NEGATIVE 07/16/2011 1619   PROTEINUR >300 (A) 07/16/2011 1619   UROBILINOGEN 1.0 07/16/2011 1619   NITRITE NEGATIVE 07/16/2011 1619   LEUKOCYTESUR SMALL (A) 07/16/2011 1619   Sepsis Labs Invalid input(s): PROCALCITONIN,  WBC,  LACTICIDVEN Microbiology Recent Results (from the past 240 hour(s))  Gram stain     Status: None   Collection Time: 11/09/17  2:41 PM  Result Value Ref Range Status   Specimen Description PERITONEAL  Final   Special Requests NONE  Final   Gram Stain   Final    ABUNDANT WBC PRESENT,BOTH PMN AND MONONUCLEAR NO ORGANISMS SEEN Performed at [facility], [place] [address], [place], [place] [address]    Report Status 11/09/2017 FINAL  Final  Culture, body fluid-bottle     Status: None   Collection Time: 11/09/17  2:41 PM  Result Value Ref Range Status   Specimen Description PERITONEAL  Final   Special Requests NONE  Final   Culture   Final    NO GROWTH 5 DAYS Performed at [facility] [address] [place], [place] [address]    Report Status 11/14/2017 FINAL  Final  Culture, blood (x 2)     Status: None (Preliminary result)   Collection Time: 11/15/17 10:54 PM  Result Value Ref Range  Status   Specimen Description BLOOD LEFT ARM  Final   Special Requests   Final    BOTTLES DRAWN AEROBIC AND ANAEROBIC Blood Culture adequate volume   Culture   Final    NO GROWTH 3 DAYS Performed at [facility], [place] [address], [place], [place] [address]    Report Status PENDING  Incomplete  Culture, blood (x 2)     Status: None (Preliminary result)   Collection Time: 11/15/17 11:55 PM  Result Value Ref Range Status   Specimen Description BLOOD RIGHT FOREARM  Final   Special Requests   Final    BOTTLES DRAWN AEROBIC AND ANAEROBIC Blood Culture adequate volume   Culture   Final    NO GROWTH 3 DAYS Performed at [facility], [place] [address] [place], [place] [address]    Report Status PENDING  Incomplete  Body fluid culture     Status: None (Preliminary result)   Collection Time: 11/16/17  3:41 PM  Result Value Ref Range Status   Specimen Description PERITONEAL CAVITY  Final   Special Requests NONE  Final   Gram Stain   Final    ABUNDANT WBC PRESENT, PREDOMINANTLY PMN NO ORGANISMS SEEN    Culture   Final    NO GROWTH 3 DAYS Performed at [facility], [place] [address], [place],  [address]    Report Status PENDING  Incomplete  MRSA PCR Screening     Status: None   Collection Time: 11/17/17  2:27 AM  Result Value Ref Range Status   MRSA by PCR  NEGATIVE NEGATIVE Final    Comment:        The GeneXpert MRSA Assay (FDA approved for NASAL specimens only), is one component of a comprehensive MRSA colonization surveillance program. It is not intended to diagnose MRSA infection nor to guide or monitor treatment for MRSA infections. Performed at [facility], [place] [address] [place], [place] [address]      Time coordinating discharge: 45 minutes  SIGNED:   Tawni Millers, MD  [facility] 11/19/2017, 12:19 PM Pager [phone]  If 7PM-7AM, please contact night-coverage [email or web address] Password [email or web address]

## 2017-11-19 NOTE — Evaluation (Signed)
Physical Therapy Evaluation/ Discharge Patient Details Name: [name] MRN: [id number] DOB: 07/08/1961 Today's Date: 11/19/2017   History of Present Illness   57 yo admitted 5/7 with a working diagnosis of sepsis due to persistent spontaneous bacterial peritonitis. Pt was being treated for peritonitis and left AMA 5/4. PMhx: alcoholic liver cirrhosis with ascites, hypertension, COPD, GERD, polysubstance abuse, hepatitis B and C, end-stage renal disease on hemodialysis  Clinical Impression  Pt eager to be D/C from hospital and refused to wear gait belt with ambulation. Pt displays some functional deficits related to gait and safety awareness but is able to perform all mobility required to access D/C location. Pt agreeable to D/C from PT services and has no acute needs currently.    Follow Up Recommendations No PT follow up    Equipment Recommendations  None recommended by PT    Recommendations for Other Services       Precautions / Restrictions Precautions Precautions: Fall      Mobility  Bed Mobility Overal bed mobility: Modified Independent                Transfers Overall transfer level: Modified independent                  Ambulation/Gait Ambulation/Gait assistance: Modified independent (Device/Increase time) Ambulation Distance (Feet): 200 Feet Assistive device: None Gait Pattern/deviations: Step-through pattern;Decreased stride length   Gait velocity interpretation: 1.31 - 2.62 ft/sec, indicative of limited community ambulator General Gait Details: pt with increased sway with decreased speed. PT agitated with cues for increased distance and stairs and refused further mobility or testing  Stairs Stairs: (pt refused to attempt)          Wheelchair Mobility    Modified Rankin (Stroke Patients Only)       Balance Overall balance assessment: Mild deficits observed, not formally tested                                            Pertinent Vitals/Pain Pain Assessment: No/denies pain    Home Living Family/patient expects to be discharged to:: Private residence Living Arrangements: Children Available Help at Discharge: Family;Available 24 hours/day Type of Home: House Home Access: Stairs to enter   [employer] of Steps: 4 Home Layout: One level Home Equipment: Cane - single point      Prior Function Level of Independence: Independent with assistive device(s)         Comments: cane for mobility at times     Hand Dominance        Extremity/Trunk Assessment   Upper Extremity Assessment Upper Extremity Assessment: Overall WFL for tasks assessed    Lower Extremity Assessment Lower Extremity Assessment: Overall WFL for tasks assessed    Cervical / Trunk Assessment Cervical / Trunk Assessment: Kyphotic  Communication   Communication: No difficulties  Cognition Arousal/Alertness: Awake/alert Behavior During Therapy: Impulsive Overall Cognitive Status: Impaired/Different from baseline Area of Impairment: Problem solving;Following commands;Safety/judgement                       Following Commands: Follows one step commands inconsistently Safety/Judgement: Decreased awareness of deficits   Problem Solving: Requires verbal cues        General Comments      Exercises     Assessment/Plan    PT Assessment Patent does not need any further PT services  PT Problem List         PT Treatment Interventions      PT Goals (Current goals can be found in the Care Plan section)  Acute Rehab PT Goals PT Goal Formulation: All assessment and education complete, DC therapy    Frequency     Barriers to discharge        Co-evaluation               AM-PAC PT "6 Clicks" Daily Activity  Outcome Measure Difficulty turning over in bed (including adjusting bedclothes, sheets and blankets)?: None Difficulty moving from lying on back to sitting on the side of  the bed? : None Difficulty sitting down on and standing up from a chair with arms (e.g., wheelchair, bedside commode, etc,.)?: None Help needed moving to and from a bed to chair (including a wheelchair)?: None Help needed walking in hospital room?: None Help needed climbing 3-5 steps with a railing? : A Little 6 Click Score: 23    End of Session Equipment Utilized During Treatment: (pt refused use of gait belt) Activity Tolerance: Patient tolerated treatment well;Treatment limited secondary to agitation Patient left: Other (comment)(in transport chair with RN) Nurse Communication: Mobility status PT Visit Diagnosis: Unsteadiness on feet (R26.81);Other abnormalities of gait and mobility (R26.89)    Time: 1007-1219 PT Time Calculation (min) (ACUTE ONLY): 17 min   Charges:   PT Evaluation $PT Eval Low Complexity: 1 Low     PT G Codes:        Elwyn Reach, PT [phone]   Hayes B Anjalina Bergevin 11/19/2017, 2:00 PM

## 2017-11-20 LAB — BODY FLUID CULTURE: Culture: NO GROWTH

## 2017-11-21 ENCOUNTER — Observation Stay (HOSPITAL_COMMUNITY)
Admission: RE | Admit: 2017-11-21 | Discharge: 2017-11-22 | Disposition: A | Discharge status: Home / Self Care | Payer: Medicare Other | Source: Ambulatory Visit | Attending: Vascular Surgery | Admitting: Vascular Surgery

## 2017-11-21 ENCOUNTER — Observation Stay: Admission: RE | Admit: 2017-11-21 | Payer: Medicare Other | Source: Ambulatory Visit | Admitting: Vascular Surgery

## 2017-11-21 DIAGNOSIS — Z888 Allergy status to other drugs, medicaments and biological substances status: Secondary | ICD-10-CM | POA: Diagnosis not present

## 2017-11-21 DIAGNOSIS — D509 Iron deficiency anemia, unspecified: Secondary | ICD-10-CM | POA: Diagnosis not present

## 2017-11-21 DIAGNOSIS — B182 Chronic viral hepatitis C: Secondary | ICD-10-CM | POA: Diagnosis not present

## 2017-11-21 DIAGNOSIS — F17210 Nicotine dependence, cigarettes, uncomplicated: Secondary | ICD-10-CM | POA: Insufficient documentation

## 2017-11-21 DIAGNOSIS — N2581 Secondary hyperparathyroidism of renal origin: Secondary | ICD-10-CM | POA: Diagnosis not present

## 2017-11-21 DIAGNOSIS — N186 End stage renal disease: Secondary | ICD-10-CM | POA: Diagnosis not present

## 2017-11-21 DIAGNOSIS — Z955 Presence of coronary angioplasty implant and graft: Secondary | ICD-10-CM | POA: Insufficient documentation

## 2017-11-21 DIAGNOSIS — B181 Chronic viral hepatitis B without delta-agent: Secondary | ICD-10-CM | POA: Diagnosis not present

## 2017-11-21 DIAGNOSIS — I700 Atherosclerosis of aorta: Secondary | ICD-10-CM | POA: Diagnosis not present

## 2017-11-21 DIAGNOSIS — I120 Hypertensive chronic kidney disease with stage 5 chronic kidney disease or end stage renal disease: Principal | ICD-10-CM | POA: Insufficient documentation

## 2017-11-21 DIAGNOSIS — Z66 Do not resuscitate: Secondary | ICD-10-CM | POA: Insufficient documentation

## 2017-11-21 DIAGNOSIS — F419 Anxiety disorder, unspecified: Secondary | ICD-10-CM | POA: Diagnosis not present

## 2017-11-21 DIAGNOSIS — I050 Rheumatic mitral stenosis: Secondary | ICD-10-CM | POA: Insufficient documentation

## 2017-11-21 DIAGNOSIS — K219 Gastro-esophageal reflux disease without esophagitis: Secondary | ICD-10-CM | POA: Insufficient documentation

## 2017-11-21 DIAGNOSIS — K7460 Unspecified cirrhosis of liver: Secondary | ICD-10-CM | POA: Diagnosis not present

## 2017-11-21 DIAGNOSIS — A499 Bacterial infection, unspecified: Secondary | ICD-10-CM | POA: Diagnosis not present

## 2017-11-21 DIAGNOSIS — E8889 Other specified metabolic disorders: Secondary | ICD-10-CM | POA: Diagnosis not present

## 2017-11-21 DIAGNOSIS — Z7902 Long term (current) use of antithrombotics/antiplatelets: Secondary | ICD-10-CM | POA: Insufficient documentation

## 2017-11-21 DIAGNOSIS — R188 Other ascites: Secondary | ICD-10-CM | POA: Insufficient documentation

## 2017-11-21 DIAGNOSIS — Z886 Allergy status to analgesic agent status: Secondary | ICD-10-CM | POA: Insufficient documentation

## 2017-11-21 DIAGNOSIS — Z992 Dependence on renal dialysis: Secondary | ICD-10-CM | POA: Diagnosis not present

## 2017-11-21 DIAGNOSIS — I447 Left bundle-branch block, unspecified: Secondary | ICD-10-CM | POA: Diagnosis not present

## 2017-11-21 DIAGNOSIS — J449 Chronic obstructive pulmonary disease, unspecified: Secondary | ICD-10-CM | POA: Diagnosis not present

## 2017-11-21 DIAGNOSIS — T82898A Other specified complication of vascular prosthetic devices, implants and grafts, initial encounter: Secondary | ICD-10-CM | POA: Diagnosis not present

## 2017-11-21 DIAGNOSIS — I4892 Unspecified atrial flutter: Secondary | ICD-10-CM | POA: Diagnosis not present

## 2017-11-21 DIAGNOSIS — M19012 Primary osteoarthritis, left shoulder: Secondary | ICD-10-CM | POA: Insufficient documentation

## 2017-11-21 DIAGNOSIS — A0472 Enterocolitis due to Clostridium difficile, not specified as recurrent: Secondary | ICD-10-CM | POA: Diagnosis not present

## 2017-11-21 DIAGNOSIS — I2510 Atherosclerotic heart disease of native coronary artery without angina pectoris: Secondary | ICD-10-CM | POA: Insufficient documentation

## 2017-11-21 DIAGNOSIS — K652 Spontaneous bacterial peritonitis: Secondary | ICD-10-CM | POA: Diagnosis not present

## 2017-11-21 DIAGNOSIS — Z79899 Other long term (current) drug therapy: Secondary | ICD-10-CM | POA: Insufficient documentation

## 2017-11-21 DIAGNOSIS — D631 Anemia in chronic kidney disease: Secondary | ICD-10-CM | POA: Diagnosis not present

## 2017-11-21 DIAGNOSIS — I252 Old myocardial infarction: Secondary | ICD-10-CM | POA: Insufficient documentation

## 2017-11-21 DIAGNOSIS — Z87442 Personal history of urinary calculi: Secondary | ICD-10-CM | POA: Insufficient documentation

## 2017-11-21 LAB — SURGICAL PCR SCREEN
MRSA, PCR: NEGATIVE
Staphylococcus aureus: NEGATIVE

## 2017-11-21 LAB — COMPREHENSIVE METABOLIC PANEL
ALT: 11 U/L — ABNORMAL LOW (ref 17–63)
AST: 18 U/L (ref 15–41)
Albumin: 2.5 g/dL — ABNORMAL LOW (ref 3.5–5.0)
Alkaline Phosphatase: 165 U/L — ABNORMAL HIGH (ref 38–126)
Anion gap: 12 (ref 5–15)
BUN: 18 mg/dL (ref 6–20)
CO2: 28 mmol/L (ref 22–32)
Calcium: 8.1 mg/dL — ABNORMAL LOW (ref 8.9–10.3)
Chloride: 91 mmol/L — ABNORMAL LOW (ref 101–111)
Creatinine, Ser: 4.93 mg/dL — ABNORMAL HIGH (ref 0.61–1.24)
GFR calc Af Amer: 14 mL/min — ABNORMAL LOW (ref >60)
GFR calc non Af Amer: 12 mL/min — ABNORMAL LOW (ref >60)
Glucose, Bld: 154 mg/dL — ABNORMAL HIGH (ref 65–99)
Potassium: 3.8 mmol/L (ref 3.5–5.1)
Sodium: 131 mmol/L — ABNORMAL LOW (ref 135–145)
Total Bilirubin: 0.7 mg/dL (ref 0.3–1.2)
Total Protein: 6.1 g/dL — ABNORMAL LOW (ref 6.5–8.1)

## 2017-11-21 LAB — CBC
HCT: 24.6 % — ABNORMAL LOW (ref 39.0–52.0)
Hemoglobin: 8.1 g/dL — ABNORMAL LOW (ref 13.0–17.0)
MCH: 24.3 pg — ABNORMAL LOW (ref 26.0–34.0)
MCHC: 32.9 g/dL (ref 30.0–36.0)
MCV: 73.9 fL — ABNORMAL LOW (ref 78.0–100.0)
Platelets: 157 10*3/uL (ref 150–400)
RBC: 3.33 MIL/uL — ABNORMAL LOW (ref 4.22–5.81)
RDW: 20.8 % — ABNORMAL HIGH (ref 11.5–15.5)
WBC: 11.4 10*3/uL — ABNORMAL HIGH (ref 4.0–10.5)

## 2017-11-21 LAB — CULTURE, BLOOD (ROUTINE X 2)
Culture: NO GROWTH
Culture: NO GROWTH
Special Requests: ADEQUATE
Special Requests: ADEQUATE

## 2017-11-21 LAB — PROTIME-INR
INR: 1.26
Prothrombin Time: 15.7 seconds — ABNORMAL HIGH (ref 11.4–15.2)

## 2017-11-21 MED ORDER — SODIUM CHLORIDE 0.9 % IV SOLN
INTRAVENOUS | Status: DC
  Administered 2017-11-22: 20 mL/h via INTRAVENOUS

## 2017-11-21 MED ORDER — MOMETASONE FURO-FORMOTEROL FUM 100-5 MCG/ACT IN AERO
2.0000 | INHALATION_SPRAY | Freq: Two times a day (BID) | RESPIRATORY_TRACT | Status: DC
  Administered 2017-11-21: 2 via RESPIRATORY_TRACT
  Filled 2017-11-21: qty 8.8

## 2017-11-21 MED ORDER — ALPRAZOLAM 0.25 MG PO TABS
0.2500 mg | ORAL_TABLET | Freq: Once | ORAL | Status: AC
  Administered 2017-11-21: 0.25 mg via ORAL
  Filled 2017-11-21: qty 1

## 2017-11-21 MED ORDER — LABETALOL HCL 5 MG/ML IV SOLN: 10.0000 mg | INTRAVENOUS | Status: DC | PRN

## 2017-11-21 MED ORDER — ISOSORBIDE MONONITRATE ER 30 MG PO TB24
30.0000 mg | ORAL_TABLET | Freq: Every day | ORAL | Status: DC
  Administered 2017-11-22: 30 mg via ORAL
  Filled 2017-11-21: qty 1

## 2017-11-21 MED ORDER — OXYCODONE HCL 5 MG PO TABS
5.0000 mg | ORAL_TABLET | Freq: Four times a day (QID) | ORAL | Status: DC | PRN
  Administered 2017-11-21 – 2017-11-22 (×4): 5 mg via ORAL
  Filled 2017-11-21 (×4): qty 1

## 2017-11-21 MED ORDER — FLUTICASONE PROPIONATE 50 MCG/ACT NA SUSP
2.0000 | Freq: Every day | NASAL | Status: DC
  Filled 2017-11-21: qty 16

## 2017-11-21 MED ORDER — POTASSIUM CHLORIDE CRYS ER 20 MEQ PO TBCR
20.0000 meq | EXTENDED_RELEASE_TABLET | Freq: Once | ORAL | Status: AC
  Administered 2017-11-21: 20 meq via ORAL
  Filled 2017-11-21: qty 2

## 2017-11-21 MED ORDER — NEPRO/CARBSTEADY PO LIQD
237.0000 mL | Freq: Two times a day (BID) | ORAL | Status: DC
  Filled 2017-11-21 (×4): qty 237

## 2017-11-21 MED ORDER — CHLORHEXIDINE GLUCONATE 4 % EX LIQD
60.0000 mL | Freq: Once | CUTANEOUS | Status: AC
  Administered 2017-11-22: 4 via TOPICAL
  Filled 2017-11-21: qty 60

## 2017-11-21 MED ORDER — ALBUTEROL SULFATE (2.5 MG/3ML) 0.083% IN NEBU: 3.0000 mL | INHALATION_SOLUTION | Freq: Four times a day (QID) | RESPIRATORY_TRACT | Status: DC | PRN

## 2017-11-21 MED ORDER — GABAPENTIN 100 MG PO CAPS
100.0000 mg | ORAL_CAPSULE | Freq: Two times a day (BID) | ORAL | Status: DC
  Administered 2017-11-21 – 2017-11-22 (×2): 100 mg via ORAL
  Filled 2017-11-21 (×2): qty 1

## 2017-11-21 MED ORDER — CEFAZOLIN SODIUM-DEXTROSE 2-4 GM/100ML-% IV SOLN
2.0000 g | INTRAVENOUS | Status: AC
  Administered 2017-11-22: 2 g via INTRAVENOUS
  Filled 2017-11-21: qty 100

## 2017-11-21 MED ORDER — SEVELAMER CARBONATE 800 MG PO TABS: 1600.0000 mg | ORAL_TABLET | Freq: Three times a day (TID) | ORAL | Status: DC | PRN

## 2017-11-21 MED ORDER — SEVELAMER CARBONATE 800 MG PO TABS
3200.0000 mg | ORAL_TABLET | Freq: Three times a day (TID) | ORAL | Status: DC
  Filled 2017-11-21 (×2): qty 4

## 2017-11-21 MED ORDER — HEPARIN SODIUM (PORCINE) 5000 UNIT/ML IJ SOLN
5000.0000 [IU] | Freq: Three times a day (TID) | INTRAMUSCULAR | Status: DC
  Filled 2017-11-21: qty 1

## 2017-11-21 MED ORDER — CHLORHEXIDINE GLUCONATE 4 % EX LIQD
60.0000 mL | Freq: Once | CUTANEOUS | Status: DC
  Filled 2017-11-21: qty 60

## 2017-11-21 MED ORDER — ALUM & MAG HYDROXIDE-SIMETH 200-200-20 MG/5ML PO SUSP: 15.0000 mL | ORAL | Status: DC | PRN

## 2017-11-21 MED ORDER — PHENOL 1.4 % MT LIQD: 1.0000 | OROMUCOSAL | Status: DC | PRN

## 2017-11-21 MED ORDER — FAMOTIDINE 20 MG PO TABS
20.0000 mg | ORAL_TABLET | Freq: Every day | ORAL | Status: DC
  Administered 2017-11-21: 20 mg via ORAL
  Filled 2017-11-21: qty 1

## 2017-11-21 MED ORDER — SODIUM CHLORIDE 0.9 % IV SOLN: 1.0000 g | Freq: Once | INTRAVENOUS | Status: DC

## 2017-11-21 MED ORDER — TIOTROPIUM BROMIDE MONOHYDRATE 18 MCG IN CAPS
18.0000 ug | ORAL_CAPSULE | Freq: Every day | RESPIRATORY_TRACT | Status: DC
  Filled 2017-11-21: qty 5

## 2017-11-21 MED ORDER — HYDRALAZINE HCL 50 MG PO TABS
100.0000 mg | ORAL_TABLET | Freq: Three times a day (TID) | ORAL | Status: DC
  Administered 2017-11-21 – 2017-11-22 (×3): 100 mg via ORAL
  Filled 2017-11-21 (×3): qty 2

## 2017-11-21 MED ORDER — METOPROLOL TARTRATE 5 MG/5ML IV SOLN: 2.0000 mg | INTRAVENOUS | Status: DC | PRN

## 2017-11-21 MED ORDER — LACTULOSE 10 GM/15ML PO SOLN
20.0000 g | Freq: Two times a day (BID) | ORAL | Status: DC
  Administered 2017-11-21: 20 g via ORAL
  Filled 2017-11-21 (×2): qty 30

## 2017-11-21 MED ORDER — PANTOPRAZOLE SODIUM 40 MG PO TBEC: 40.0000 mg | DELAYED_RELEASE_TABLET | Freq: Every day | ORAL | Status: DC

## 2017-11-21 MED ORDER — DILTIAZEM HCL ER COATED BEADS 120 MG PO CP24
120.0000 mg | ORAL_CAPSULE | Freq: Every day | ORAL | Status: DC
  Administered 2017-11-22: 120 mg via ORAL
  Filled 2017-11-21: qty 1

## 2017-11-21 MED ORDER — HYDRALAZINE HCL 20 MG/ML IJ SOLN: 5.0000 mg | INTRAMUSCULAR | Status: DC | PRN

## 2017-11-21 MED ORDER — CEFTAZIDIME 2 G IV SOLR: 2.0000 g | INTRAVENOUS | Status: DC

## 2017-11-21 MED ORDER — GUAIFENESIN-DM 100-10 MG/5ML PO SYRP: 15.0000 mL | ORAL_SOLUTION | ORAL | Status: DC | PRN

## 2017-11-21 MED ORDER — LORATADINE 10 MG PO TABS
10.0000 mg | ORAL_TABLET | Freq: Every day | ORAL | Status: DC
  Administered 2017-11-22: 10 mg via ORAL
  Filled 2017-11-21: qty 1

## 2017-11-21 MED ORDER — PANTOPRAZOLE SODIUM 40 MG PO TBEC
40.0000 mg | DELAYED_RELEASE_TABLET | Freq: Every day | ORAL | Status: DC
  Administered 2017-11-22: 40 mg via ORAL
  Filled 2017-11-21: qty 1

## 2017-11-21 MED ORDER — CLOPIDOGREL BISULFATE 75 MG PO TABS
75.0000 mg | ORAL_TABLET | Freq: Every day | ORAL | Status: DC
  Administered 2017-11-22: 75 mg via ORAL
  Filled 2017-11-21: qty 1

## 2017-11-21 MED ORDER — ONDANSETRON HCL 4 MG/2ML IJ SOLN
4.0000 mg | Freq: Four times a day (QID) | INTRAMUSCULAR | Status: DC | PRN
  Administered 2017-11-22: 4 mg via INTRAVENOUS

## 2017-11-21 NOTE — Progress Notes (Signed)
New Admission Note: Patient admitted to room 5M10 direct admit  Arrival Method: via WC Mental Orientation: Alert and oriented x 4 Telemetry: Not applicable Assessment: Completed Skin: Intact IV: Placed per IV team Pain: denies Tubes: None Safety Measures: Safety Fall Prevention Plan has been discussed  Admission: To be completed 5 [place] Orientation: Patient has been orientated to the room, unit and staff.   Family: None at the bedside  Orders to be reviewed and implemented. Will continue to monitor the patient. Call light has been placed within reach and bed alarm has been activated.   Mady Gemma, BSN, RN-BC Phone: [phone]

## 2017-11-21 NOTE — Anesthesia Preprocedure Evaluation (Addendum)
Anesthesia Evaluation  Patient identified by MRN, date of birth, ID band Patient awake    Reviewed: Allergy & Precautions, NPO status , Patient's Chart, lab work & pertinent test results  Airway Mallampati: II  TM Distance: >3 FB Neck ROM: Full    Dental no notable dental hx.    Pulmonary COPD,  COPD inhaler, Current Smoker,    Pulmonary exam normal breath sounds clear to auscultation       Cardiovascular hypertension, + CAD, + Past MI and + Cardiac Stents  Normal cardiovascular exam+ Valvular Problems/Murmurs  Rhythm:Regular Rate:Normal  ECG: SR, PAC's, rate 100  ECHO: LV EF: 55% -   60% Severe mitral stenosis by mean gradient. However, calculated valve area indicates mild stenosis. The leaflets are restricted but there does not appear to be severe stenosis.   Neuro/Psych PSYCHIATRIC DISORDERS Anxiety negative neurological ROS     GI/Hepatic GERD  Controlled,(+) Cirrhosis   ascites    , Hepatitis -, B, C  Endo/Other  negative endocrine ROS  Renal/GU ESRF and DialysisRenal diseaseOn HD M, W, F     Musculoskeletal negative musculoskeletal ROS (+)   Abdominal   Peds  Hematology  (+) anemia ,   Anesthesia Other Findings END STAGE RENAL DISEASE FOR HEMODIALYSIS ACCESS SBP  Reproductive/Obstetrics                           Anesthesia Physical Anesthesia Plan  ASA: IV  Anesthesia Plan: MAC   Post-op Pain Management:    Induction: Intravenous  PONV Risk Score and Plan: 0 and Propofol infusion and Treatment may vary due to age or medical condition  Airway Management Planned: Simple Face Mask  Additional Equipment:   Intra-op Plan:   Post-operative Plan:   Informed Consent: I have reviewed the patients History and Physical, chart, labs and discussed the procedure including the risks, benefits and alternatives for the proposed anesthesia with the patient or authorized representative  who has indicated his/her understanding and acceptance.   Dental advisory given  Plan Discussed with: CRNA  Anesthesia Plan Comments:         Anesthesia Quick Evaluation

## 2017-11-22 ENCOUNTER — Inpatient Hospital Stay (HOSPITAL_COMMUNITY): Payer: Medicare Other | Admitting: Anesthesiology

## 2017-11-22 ENCOUNTER — Ambulatory Visit: Payer: Medicare Other | Admitting: Physician Assistant

## 2017-11-22 ENCOUNTER — Encounter (HOSPITAL_COMMUNITY)
Admission: RE | Disposition: A | Discharge status: Home / Self Care | Payer: Self-pay | Source: Ambulatory Visit | Attending: Vascular Surgery

## 2017-11-22 ENCOUNTER — Telehealth: Payer: Self-pay | Admitting: Vascular Surgery

## 2017-11-22 DIAGNOSIS — B181 Chronic viral hepatitis B without delta-agent: Secondary | ICD-10-CM | POA: Diagnosis not present

## 2017-11-22 DIAGNOSIS — I120 Hypertensive chronic kidney disease with stage 5 chronic kidney disease or end stage renal disease: Secondary | ICD-10-CM | POA: Diagnosis not present

## 2017-11-22 DIAGNOSIS — M19012 Primary osteoarthritis, left shoulder: Secondary | ICD-10-CM | POA: Diagnosis not present

## 2017-11-22 DIAGNOSIS — F419 Anxiety disorder, unspecified: Secondary | ICD-10-CM | POA: Diagnosis not present

## 2017-11-22 DIAGNOSIS — K219 Gastro-esophageal reflux disease without esophagitis: Secondary | ICD-10-CM | POA: Diagnosis not present

## 2017-11-22 DIAGNOSIS — Z66 Do not resuscitate: Secondary | ICD-10-CM | POA: Diagnosis not present

## 2017-11-22 DIAGNOSIS — Z992 Dependence on renal dialysis: Secondary | ICD-10-CM | POA: Diagnosis not present

## 2017-11-22 DIAGNOSIS — N186 End stage renal disease: Secondary | ICD-10-CM | POA: Diagnosis not present

## 2017-11-22 DIAGNOSIS — I447 Left bundle-branch block, unspecified: Secondary | ICD-10-CM | POA: Diagnosis not present

## 2017-11-22 DIAGNOSIS — J449 Chronic obstructive pulmonary disease, unspecified: Secondary | ICD-10-CM | POA: Diagnosis not present

## 2017-11-22 DIAGNOSIS — K7460 Unspecified cirrhosis of liver: Secondary | ICD-10-CM | POA: Diagnosis not present

## 2017-11-22 DIAGNOSIS — B182 Chronic viral hepatitis C: Secondary | ICD-10-CM | POA: Diagnosis not present

## 2017-11-22 DIAGNOSIS — I4892 Unspecified atrial flutter: Secondary | ICD-10-CM | POA: Diagnosis not present

## 2017-11-22 HISTORY — PX: AV FISTULA PLACEMENT: SHX1204

## 2017-11-22 SURGERY — INSERTION OF ARTERIOVENOUS (AV) GORE-TEX GRAFT ARM
Anesthesia: Monitor Anesthesia Care | Site: Arm Upper | Laterality: Right

## 2017-11-22 MED ORDER — ROCURONIUM BROMIDE 50 MG/5ML IV SOLN
INTRAVENOUS | Status: AC
  Filled 2017-11-22: qty 1

## 2017-11-22 MED ORDER — FENTANYL CITRATE (PF) 100 MCG/2ML IJ SOLN: 25.0000 ug | INTRAMUSCULAR | Status: DC | PRN

## 2017-11-22 MED ORDER — CEFAZOLIN SODIUM 1 G IJ SOLR
INTRAMUSCULAR | Status: AC
  Filled 2017-11-22: qty 20

## 2017-11-22 MED ORDER — PHENYLEPHRINE 40 MCG/ML (10ML) SYRINGE FOR IV PUSH (FOR BLOOD PRESSURE SUPPORT)
PREFILLED_SYRINGE | INTRAVENOUS | Status: AC
  Filled 2017-11-22: qty 10

## 2017-11-22 MED ORDER — ESMOLOL HCL 100 MG/10ML IV SOLN
INTRAVENOUS | Status: DC | PRN
  Administered 2017-11-22 (×3): 20 mg via INTRAVENOUS

## 2017-11-22 MED ORDER — GLYCOPYRROLATE 0.2 MG/ML IJ SOLN
INTRAMUSCULAR | Status: DC | PRN
  Administered 2017-11-22: 0.2 mg via INTRAVENOUS

## 2017-11-22 MED ORDER — LIDOCAINE HCL (PF) 1 % IJ SOLN
INTRAMUSCULAR | Status: AC
  Filled 2017-11-22: qty 30

## 2017-11-22 MED ORDER — MIDAZOLAM HCL 2 MG/2ML IJ SOLN
INTRAMUSCULAR | Status: AC
  Filled 2017-11-22: qty 2

## 2017-11-22 MED ORDER — HEPARIN SODIUM (PORCINE) 1000 UNIT/ML IJ SOLN
INTRAMUSCULAR | Status: AC
  Filled 2017-11-22: qty 1

## 2017-11-22 MED ORDER — DIPHENHYDRAMINE HCL 50 MG/ML IJ SOLN
INTRAMUSCULAR | Status: AC
  Filled 2017-11-22: qty 1

## 2017-11-22 MED ORDER — LIDOCAINE HCL (PF) 1 % IJ SOLN
INTRAMUSCULAR | Status: DC | PRN
  Administered 2017-11-22: 10 mL

## 2017-11-22 MED ORDER — PROTAMINE SULFATE 10 MG/ML IV SOLN
INTRAVENOUS | Status: DC | PRN
  Administered 2017-11-22: 30 mg via INTRAVENOUS

## 2017-11-22 MED ORDER — NEOSTIGMINE METHYLSULFATE 10 MG/10ML IV SOLN
INTRAVENOUS | Status: DC | PRN
  Administered 2017-11-22: 2 mg via INTRAVENOUS

## 2017-11-22 MED ORDER — HEPARIN SODIUM (PORCINE) 1000 UNIT/ML IJ SOLN
INTRAMUSCULAR | Status: DC | PRN
  Administered 2017-11-22: 3000 [IU] via INTRAVENOUS

## 2017-11-22 MED ORDER — DEXAMETHASONE SODIUM PHOSPHATE 10 MG/ML IJ SOLN
INTRAMUSCULAR | Status: AC
  Filled 2017-11-22: qty 1

## 2017-11-22 MED ORDER — DIPHENHYDRAMINE HCL 50 MG/ML IJ SOLN
INTRAMUSCULAR | Status: DC | PRN
  Administered 2017-11-22: 12.5 mg via INTRAVENOUS

## 2017-11-22 MED ORDER — PROPOFOL 10 MG/ML IV BOLUS
INTRAVENOUS | Status: AC
  Filled 2017-11-22: qty 20

## 2017-11-22 MED ORDER — FENTANYL CITRATE (PF) 250 MCG/5ML IJ SOLN
INTRAMUSCULAR | Status: AC
  Filled 2017-11-22: qty 5

## 2017-11-22 MED ORDER — OXYCODONE HCL 10 MG PO TABS: 10.0000 mg | ORAL_TABLET | Freq: Four times a day (QID) | ORAL | 0 refills | Status: DC | PRN

## 2017-11-22 MED ORDER — SODIUM CHLORIDE 0.9 % IV SOLN
INTRAVENOUS | Status: AC
  Filled 2017-11-22: qty 1.2

## 2017-11-22 MED ORDER — 0.9 % SODIUM CHLORIDE (POUR BTL) OPTIME
TOPICAL | Status: DC | PRN
  Administered 2017-11-22: 1000 mL

## 2017-11-22 MED ORDER — ESMOLOL HCL 100 MG/10ML IV SOSY
PREFILLED_SYRINGE | INTRAVENOUS | Status: DC
  Administered 2017-11-22: 12:00:00 via INTRAVENOUS

## 2017-11-22 MED ORDER — SUCCINYLCHOLINE CHLORIDE 200 MG/10ML IV SOSY
PREFILLED_SYRINGE | INTRAVENOUS | Status: AC
  Filled 2017-11-22: qty 10

## 2017-11-22 MED ORDER — ONDANSETRON HCL 4 MG/2ML IJ SOLN
INTRAMUSCULAR | Status: AC
  Filled 2017-11-22: qty 2

## 2017-11-22 MED ORDER — LIDOCAINE HCL (CARDIAC) PF 100 MG/5ML IV SOSY
PREFILLED_SYRINGE | INTRAVENOUS | Status: DC | PRN
  Administered 2017-11-22: 60 mg via INTRATRACHEAL

## 2017-11-22 MED ORDER — PHENYLEPHRINE HCL 10 MG/ML IJ SOLN
INTRAVENOUS | Status: DC | PRN
  Administered 2017-11-22: 20 ug/min via INTRAVENOUS

## 2017-11-22 MED ORDER — SODIUM CHLORIDE 0.9 % IV SOLN
INTRAVENOUS | Status: DC | PRN
  Administered 2017-11-22: 07:00:00 via INTRAVENOUS

## 2017-11-22 MED ORDER — OXYCODONE HCL 5 MG PO TABS: 5.0000 mg | ORAL_TABLET | Freq: Four times a day (QID) | ORAL | 0 refills | Status: DC | PRN

## 2017-11-22 MED ORDER — FENTANYL CITRATE (PF) 100 MCG/2ML IJ SOLN
INTRAMUSCULAR | Status: DC | PRN
  Administered 2017-11-22: 50 ug via INTRAVENOUS
  Administered 2017-11-22: 100 ug via INTRAVENOUS

## 2017-11-22 MED ORDER — HEMOSTATIC AGENTS (NO CHARGE) OPTIME
TOPICAL | Status: DC | PRN
  Administered 2017-11-22 (×2): 1 via TOPICAL

## 2017-11-22 MED ORDER — ESMOLOL HCL 100 MG/10ML IV SOLN
50.0000 ug/kg/min | Freq: Once | INTRAVENOUS | Status: DC
  Administered 2017-11-22: 3.425 mg/min via INTRAVENOUS
  Filled 2017-11-22: qty 10

## 2017-11-22 MED ORDER — SODIUM CHLORIDE 0.9 % IV SOLN
INTRAVENOUS | Status: DC | PRN
  Administered 2017-11-22: 07:00:00

## 2017-11-22 MED ORDER — LIDOCAINE 2% (20 MG/ML) 5 ML SYRINGE
INTRAMUSCULAR | Status: AC
  Filled 2017-11-22: qty 5

## 2017-11-22 MED ORDER — ROCURONIUM BROMIDE 100 MG/10ML IV SOLN
INTRAVENOUS | Status: DC | PRN
  Administered 2017-11-22: 40 mg via INTRAVENOUS

## 2017-11-22 MED ORDER — NEOSTIGMINE METHYLSULFATE 5 MG/5ML IV SOSY
PREFILLED_SYRINGE | INTRAVENOUS | Status: AC
  Filled 2017-11-22: qty 5

## 2017-11-22 MED ORDER — DEXAMETHASONE SODIUM PHOSPHATE 10 MG/ML IJ SOLN
INTRAMUSCULAR | Status: DC | PRN
  Administered 2017-11-22: 10 mg via INTRAVENOUS

## 2017-11-22 MED ORDER — PHENYLEPHRINE HCL 10 MG/ML IJ SOLN
INTRAMUSCULAR | Status: DC | PRN
  Administered 2017-11-22: 80 ug via INTRAVENOUS

## 2017-11-22 MED ORDER — PROPOFOL 10 MG/ML IV BOLUS
INTRAVENOUS | Status: DC | PRN
  Administered 2017-11-22: 10 mg via INTRAVENOUS
  Administered 2017-11-22: 100 mg via INTRAVENOUS

## 2017-11-22 SURGICAL SUPPLY — 34 items
ADH SKN CLS APL DERMABOND .7 (GAUZE/BANDAGES/DRESSINGS) ×1
AGENT HMST SPONGE THK3/8 (HEMOSTASIS) ×2
ARMBAND PINK RESTRICT EXTREMIT (MISCELLANEOUS) ×3 IMPLANT
CANISTER SUCT 3000ML PPV (MISCELLANEOUS) ×2 IMPLANT
CANNULA VESSEL 3MM 2 BLNT TIP (CANNULA) ×2 IMPLANT
CLIP VESOCCLUDE MED 6/CT (CLIP) ×2 IMPLANT
CLIP VESOCCLUDE SM WIDE 6/CT (CLIP) ×2 IMPLANT
DECANTER SPIKE VIAL GLASS SM (MISCELLANEOUS) ×1 IMPLANT
DERMABOND ADVANCED (GAUZE/BANDAGES/DRESSINGS) ×1
DERMABOND ADVANCED .7 DNX12 (GAUZE/BANDAGES/DRESSINGS) ×1 IMPLANT
ELECT REM PT RETURN 9FT ADLT (ELECTROSURGICAL) ×2
ELECTRODE REM PT RTRN 9FT ADLT (ELECTROSURGICAL) ×1 IMPLANT
GLOVE BIO SURGEON STRL SZ7.5 (GLOVE) ×2 IMPLANT
GLOVE BIOGEL PI IND STRL 6.5 (GLOVE) IMPLANT
GLOVE BIOGEL PI INDICATOR 6.5 (GLOVE) ×1
GLOVE SURG SS PI 6.5 STRL IVOR (GLOVE) ×1 IMPLANT
GOWN STRL REUS W/ TWL LRG LVL3 (GOWN DISPOSABLE) ×3 IMPLANT
GOWN STRL REUS W/TWL LRG LVL3 (GOWN DISPOSABLE) ×8
GRAFT GORETEX STRT 6X50 (Vascular Products) ×1 IMPLANT
HEMOSTAT SPONGE AVITENE ULTRA (HEMOSTASIS) ×2 IMPLANT
KIT BASIN OR (CUSTOM PROCEDURE TRAY) ×2 IMPLANT
KIT TURNOVER KIT B (KITS) ×2 IMPLANT
NS IRRIG 1000ML POUR BTL (IV SOLUTION) ×2 IMPLANT
PACK CV ACCESS (CUSTOM PROCEDURE TRAY) ×2 IMPLANT
PAD ARMBOARD 7.5X6 YLW CONV (MISCELLANEOUS) ×4 IMPLANT
SUT PROLENE 5 0 C 1 24 (SUTURE) ×1 IMPLANT
SUT PROLENE 6 0 CC (SUTURE) ×6 IMPLANT
SUT VIC AB 3-0 SH 27 (SUTURE) ×4
SUT VIC AB 3-0 SH 27X BRD (SUTURE) ×2 IMPLANT
SUT VICRYL 4-0 PS2 18IN ABS (SUTURE) ×4 IMPLANT
SYR TOOMEY 50ML (SYRINGE) IMPLANT
TOWEL GREEN STERILE (TOWEL DISPOSABLE) ×2 IMPLANT
UNDERPAD 30X30 (UNDERPADS AND DIAPERS) ×2 IMPLANT
WATER STERILE IRR 1000ML POUR (IV SOLUTION) ×1 IMPLANT

## 2017-11-22 NOTE — Op Note (Signed)
Procedure: Right Upper Arm AV graft  Preop: ESRD  Postop: ESRD  Anesthesia: General  Assistant: Leontine Locket, PA-C  Findings:6 mm PTFE end to side to axillary vein   Procedure Details: The right upper extremity was prepped and draped in usual sterile fashion.  A transverse incision was then made near the antecubital crease the right arm. This was over a pre-existing brachial cephalic AV fistula.  It was dissected free circumferentially adjacent to the anastomosis.  It was 6 mm in diameter at this level.  At this point, a longitudinal incision was made in the axilla and carried through the subcutaneous tissues and fascia to expose the axillary vein.  The nerves were protected.  The vein was approximately 4-5 mm in diameter.  It was dissected free and small side branches ligated with 3 0 silk ties.  Next, a subcutaneous tunnel was created connecting the upper arm to the lower arm incision in an arcing configuration over the biceps muscle.  A 6 mm PTFE graft was then brought through this subcutaneous tunnel. The patient was given 3000 units of intravenous heparin. After appropriate circulation time, the fistula clamps were used to control the proximal fistula. It was transected.  The end of the graft was sewn end to end to the pre-existing fistula using a 6 0 prolene.  At completion of the anastomosis the artery was forward bled, backbled and thoroughly flushed.  The anastomosis was secured, vessel loops were released and there was palpable pulse in the graft.  The graft was clamped just above the arterial anastomosis with a fistula clamp. The graft was then pulled taut to length at the axillary incision.  The axillary vein was controlled with a fine bulldog clamp proximally and distally in the upper axilla.  The distal end of the graft was then beveled and sewn end to side to the vein using a running 6 0 prolene.  Just prior to completion of the anastomosis, everything was forward bled, back bled and  thoroughly flushed.  The anastomosis was secured and the fistula clamp removed from the proximal graft.  A thrill was immediately palpable in the graft. The patient was given 30 mg of protamine to assist with hemostasis.  After hemostasis was obtained, the subcutaneous tissues were reapproximated using a running 3-0 Vicryl suture. The skin was then closed with a 4 0 Vicryl subcuticular stitch. Dermabond was applied to the skin incisions.  The patient tolerated the procedure well and there were no complications.  Instrument sponge and needle count was correct at the end of the case.  The patient was taken to the recovery room in stable condition.  Ruta Hinds, MD [facility] Office: [phone] Pager: [phone]

## 2017-11-22 NOTE — Addendum Note (Signed)
Addendum  created 11/22/17 2106 by Murvin Natal, MD   Sign clinical note

## 2017-11-22 NOTE — Telephone Encounter (Signed)
sch appt 12/15/17 1245pm P/O pt number not working

## 2017-11-22 NOTE — Anesthesia Procedure Notes (Signed)
Procedure Name: Intubation Date/Time: 11/22/2017 7:52 AM Performed by: Izora Gala, CRNA Pre-anesthesia Checklist: Patient identified, Emergency Drugs available, Suction available and Patient being monitored Patient Re-evaluated:Patient Re-evaluated prior to induction Oxygen Delivery Method: Circle system utilized Preoxygenation: Pre-oxygenation with 100% oxygen Induction Type: IV induction Ventilation: Mask ventilation without difficulty and Oral airway inserted - appropriate to patient size Laryngoscope Size: Miller and 3 Grade View: Grade I Tube type: Oral Tube size: 7.5 mm Number of attempts: 1 Placement Confirmation: ETT inserted through vocal cords under direct vision,  positive ETCO2 and breath sounds checked- equal and bilateral Secured at: 23 cm Tube secured with: Tape Dental Injury: Teeth and Oropharynx as per pre-operative assessment

## 2017-11-22 NOTE — Interval H&P Note (Signed)
History and Physical Interval Note:  11/22/2017 7:27 AM  [name]  has presented today for surgery, with the diagnosis of END STAGE RENAL DISEASE FOR HEMODIALYSIS ACCESS  The various methods of treatment have been discussed with the patient and family. After consideration of risks, benefits and other options for treatment, the patient has consented to  Procedure(s): INSERTION OF ARTERIOVENOUS (AV) GORE-TEX GRAFT RIGHT ARM (Right) as a surgical intervention .  The patient's history has been reviewed, patient examined, no change in status, stable for surgery.  I have reviewed the patient's chart and labs.  Questions were answered to the patient's satisfaction.     Ruta Hinds

## 2017-11-22 NOTE — Discharge Summary (Signed)
Discharge Summary    [name] 12/11/1960 57 y.o. male  [id number]  Admission Date: 11/21/2017  Discharge Date: 11/22/17  Physician: Elam Dutch, MD  Admission Diagnosis: END STAGE RENAL DISEASE FOR HEMODIALYSIS ACCESS   HPI:   This is a 57 y.o. male admitted with C. difficile colitis as well as complications of cirrhosis.  I was asked to see him by the [facility] hospitalist service for follow-up on his recent left and right arm operations related to hemodialysis access.  He had an infiltration of his left arm access which resulted in a large wound eventually requiring skin grafting.  He had a new right brachiocephalic AV fistula placed about 3 months ago.  He currently is dialyzing via right sided catheter.   Hospital Course:  The patient was admitted to the hospital and taken to the operating room on 11/22/2017 and underwent: RUA AVG placement.      Findings:6 mm PTFE end to side to axillary vein  The pt tolerated the procedure well and was transported to the PACU in good condition.   He was discharged home later that day.  The remainder of the hospital course consisted of increasing mobilization and increasing intake of solids without difficulty.  CBC    Component Value Date/Time   WBC 11.4 (H) 11/21/2017 1954   RBC 3.33 (L) 11/21/2017 1954   HGB 8.1 (L) 11/21/2017 1954   HCT 24.6 (L) 11/21/2017 1954   PLT 157 11/21/2017 1954   MCV 73.9 (L) 11/21/2017 1954   MCH 24.3 (L) 11/21/2017 1954   MCHC 32.9 11/21/2017 1954   RDW 20.8 (H) 11/21/2017 1954   LYMPHSABS 1.1 11/19/2017 0243   MONOABS 1.7 (H) 11/19/2017 0243   EOSABS 0.1 11/19/2017 0243   BASOSABS 0.0 11/19/2017 0243    BMET    Component Value Date/Time   NA 131 (L) 11/21/2017 1954   K 3.8 11/21/2017 1954   CL 91 (L) 11/21/2017 1954   CO2 28 11/21/2017 1954   GLUCOSE 154 (H) 11/21/2017 1954   BUN 18 11/21/2017 1954   CREATININE 4.93 (H) 11/21/2017 1954   CREATININE 9.18 (H) 02/15/2017  1425   CALCIUM 8.1 (L) 11/21/2017 1954   CALCIUM 8.1 (L) 07/11/2011 1117   GFRNONAA 12 (L) 11/21/2017 1954   GFRNONAA 6 (L) 02/15/2017 1425   GFRAA 14 (L) 11/21/2017 1954   GFRAA 7 (L) 02/15/2017 1425      Discharge Instructions    Discharge patient   Complete by:  As directed    Discharge disposition:  01-Home or Self Care   Discharge patient date:  11/22/2017      Discharge Diagnosis:  END STAGE RENAL DISEASE FOR HEMODIALYSIS ACCESS  Secondary Diagnosis: Patient Active Problem List   Diagnosis Date Noted  . ESRD (end stage renal disease) ([place]) 11/21/2017  . GERD (gastroesophageal reflux disease) 11/16/2017  . Liver cirrhosis ([place]) 11/15/2017  . DNR (do not resuscitate)   . Palliative care by specialist   . Hyponatremia 11/04/2017  . SBP (spontaneous bacterial peritonitis) ([place]) 10/30/2017  . Liver disease, chronic 10/30/2017  . SOB (shortness of breath)   . Abdominal pain 10/28/2017  . Upper airway cough syndrome with flattening on f/v loop 10/13/17 c/w vcd 10/17/2017  . Elevated diaphragm 10/13/2017  . Ileus ([place]) 09/29/2017  . QT prolongation 09/29/2017  . Malnutrition of moderate degree 09/29/2017  . Sinus congestion 09/03/2017  . Symptomatic anemia 09/02/2017  . Other cirrhosis of liver ([place]) 09/02/2017  . Left bundle branch  block 09/02/2017  . Mitral stenosis 09/02/2017  . Hematochezia 07/15/2017  . Wide-complex tachycardia ([place])   . Endotracheally intubated   . ESRD on dialysis ([place]) 07/04/2017  . Acute respiratory failure with hypoxia ([place]) 06/18/2017  . CKD (chronic kidney disease) stage V requiring chronic dialysis ([place]) 06/18/2017  . History of Cocaine abuse ([place]) 06/18/2017  . Hypertension 06/18/2017  . Infection of AV graft for dialysis ([place]) 06/18/2017  . Anxiety 06/18/2017  . Anemia due to chronic kidney disease 06/18/2017  . Atrial flutter with rapid ventricular response ([place]) 06/18/2017  . Personality disorder ([place]) 06/13/2017  . Cellulitis  06/12/2017  . Adjustment disorder with mixed anxiety and depressed mood 06/10/2017  . Suicidal ideation 06/10/2017  . Arm wound, left, sequela 06/10/2017  . Dyspnea on exertion 05/29/2017  . Tachycardia 05/29/2017  . Hyperkalemia 05/22/2017  . Acute metabolic encephalopathy   . Anemia 04/23/2017  . Ascites 04/23/2017  . COPD (chronic obstructive pulmonary disease) ([place]) 04/23/2017  . Acute on chronic respiratory failure with hypoxia ([place]) 03/25/2017  . Arrhythmia 03/25/2017  . COPD GOLD 0 with flattening on inps f/v  09/27/2016  . Essential hypertension 09/27/2016  . Fluid overload 08/30/2016  . COPD exacerbation ([place]) 08/17/2016  . Hypertensive urgency 08/17/2016  . Respiratory failure ([place]) 08/17/2016  . Problem with dialysis access ([place]) 07/23/2016  . Chronic hepatitis B ([place]) 03/05/2014  . Chronic hepatitis C without hepatic coma ([place]) 03/05/2014  . Internal hemorrhoids with bleeding, swelling and itching 03/05/2014  . Thrombocytopenia ([place]) 03/05/2014  . Chest pain 02/27/2014  . Alcohol abuse 04/14/2009  . Cigarette smoker 04/14/2009  . GANGLION CYST 04/14/2009   Past Medical History:  Diagnosis Date  . Adenomatous colon polyp    tubular  . Anemia   . Anxiety   . Arthritis    left shoulder  . Atherosclerosis of aorta ([place])   . Cardiomegaly   . Chest pain    DATE UNKNOWN, C/O PERIODICALLY  . Cocaine abuse ([place])   . COPD exacerbation ([place]) 08/17/2016  . Coronary artery disease    stent 02/22/17  . Dialysis patient ([place])    Monday-Wednesday-Friday  . ESRD (end stage renal disease) on dialysis ([place])    "[name]; MWF" (07/04/2017)  . GERD (gastroesophageal reflux disease)    DATE UNKNOWN  . Hemorrhoids   . Hepatitis B, chronic ([place])   . Hepatitis C   . History of kidney stones   . Hyperkalemia   . Hypertension   . Metabolic bone disease    Patient denies  . Mitral stenosis   . Myocardial infarction ([place])   . Pneumonia   . Pulmonary edema   . Renal  disorder   . Renal insufficiency   . Shortness of breath dyspnea    " for the last past year with this dialysis"  . Solitary rectal ulcer syndrome   . Tubular adenoma of colon      Allergies as of 11/22/2017      Reactions   Aspirin Other (See Comments)   STOMACH PAIN   Clonidine Derivatives Itching   Tramadol Itching   Tylenol [acetaminophen] Nausea Only   Stomach ache      Medication List    TAKE these medications   budesonide-formoterol 80-4.5 MCG/ACT inhaler Commonly known as:  SYMBICORT Inhale 2 puffs into the lungs 2 (two) times daily.   Ceftazidime 2 g Solr injection Commonly known as:  FORTAZ Inject 2 g into the vein every other day. To receive on  hemodialysis, last dose 11/23/2017.   clopidogrel 75 MG tablet Commonly known as:  PLAVIX Take 1 tablet (75 mg total) by mouth daily.   diltiazem 120 MG 24 hr capsule Commonly known as:  CARDIZEM CD Take 1 capsule (120 mg total) by mouth daily.   famotidine 20 MG tablet Commonly known as:  PEPCID One at bedtime What changed:    how much to take  how to take this  when to take this  additional instructions   feeding supplement (NEPRO CARB STEADY) Liqd Take 237 mLs by mouth 2 (two) times daily between meals.   fluticasone 50 MCG/ACT nasal spray Commonly known as:  FLONASE Place 2 sprays into both nostrils daily.   gabapentin 100 MG capsule Commonly known as:  NEURONTIN Take 1 capsule (100 mg total) by mouth 2 (two) times daily.   hydrALAZINE 100 MG tablet Commonly known as:  APRESOLINE Take 1 tablet by mouth twice a day take after HD on dialysis days   isosorbide mononitrate 30 MG 24 hr tablet Commonly known as:  IMDUR Take 1 tablet (30 mg total) by mouth daily. What changed:  additional instructions   lactulose 10 GM/15ML solution Commonly known as:  CHRONULAC Take 30 mLs (20 g total) by mouth 2 (two) times daily.   loratadine 10 MG tablet Commonly known as:  CLARITIN Take 1 tablet (10 mg  total) by mouth daily.   oxyCODONE 5 MG immediate release tablet Commonly known as:  ROXICODONE Take 1 tablet (5 mg total) by mouth every 6 (six) hours as needed for severe pain.   pantoprazole 40 MG tablet Commonly known as:  PROTONIX Take 1 tablet (40 mg total) by mouth daily. Take 30-60 min before first meal of the day   sevelamer carbonate 800 MG tablet Commonly known as:  RENVELA Take 1,600-3,200 mg by mouth See admin instructions. Take 4 tablets (800mg  each) three times daily and 2 tablets twice daily with a snack.   tiotropium 18 MCG inhalation capsule Commonly known as:  SPIRIVA Place 18 mcg into inhaler and inhale daily.   VENTOLIN HFA 108 (90 Base) MCG/ACT inhaler Generic drug:  albuterol Inhale 2 puffs into the lungs every 6 (six) hours as needed for wheezing or shortness of breath.        Instructions:   [facility]  Discharge Instructions  AV Fistula or Graft Surgery for Dialysis Access  Please refer to the following instructions for your post-procedure care. Your surgeon or physician assistant will discuss any changes with you.  Activity  You may drive the day following your surgery, if you are comfortable and no longer taking prescription pain medication. Resume full activity as the soreness in your incision resolves.  Bathing/Showering  You may shower after you go home. Keep your incision dry for 48 hours. Do not soak in a bathtub, hot tub, or swim until the incision heals completely. You may not shower if you have a hemodialysis catheter.  Incision Care  Clean your incision with mild soap and water after 48 hours. Pat the area dry with a clean towel. You do not need a bandage unless otherwise instructed. Do not apply any ointments or creams to your incision. You may have skin glue on your incision. Do not peel it off. It will come off on its own in about one week. Your arm may swell a bit after surgery. To reduce swelling  use pillows to elevate your arm so it is above your  heart. Your doctor will tell you if you need to lightly wrap your arm with an ACE bandage.  Diet  Resume your normal diet. There are not special food restrictions following this procedure. In order to heal from your surgery, it is CRITICAL to get adequate nutrition. Your body requires vitamins, minerals, and protein. Vegetables are the best source of vitamins and minerals. Vegetables also provide the perfect balance of protein. Processed food has little nutritional value, so try to avoid this.  Medications  Resume taking all of your medications. If your incision is causing pain, you may take over-the counter pain relievers such as acetaminophen (Tylenol). If you were prescribed a stronger pain medication, please be aware these medications can cause nausea and constipation. Prevent nausea by taking the medication with a snack or meal. Avoid constipation by drinking plenty of fluids and eating foods with high amount of fiber, such as fruits, vegetables, and grains.  Do not take Tylenol if you are taking prescription pain medications.  Follow up Your surgeon may want to see you in the office following your access surgery. If so, this will be arranged at the time of your surgery.  Please call us immediately for any of the following conditions:  Increased pain, redness, drainage (pus) from your incision site Fever of 101 degrees or higher Severe or worsening pain at your incision site Hand pain or numbness.  Reduce your risk of vascular disease:  Stop smoking. If you would like help, call [facility] at 1-800-QUIT-NOW (480) 300-9618) or [place] at [place] your cholesterol Maintain a desired weight Control your diabetes Keep your blood pressure down  Dialysis  It will take several weeks to several months for your new dialysis access to be ready for use. Your surgeon will determine when it is okay to use it. Your  nephrologist will continue to direct your dialysis. You can continue to use your Permcath until your new access is ready for use.   11/22/2017 [name] [id number] 05/21/1961  Surgeon(s): Fields, Jessy Oto, MD  Procedure(s): INSERTION OF ARTERIOVENOUS (AV) GORE-TEX GRAFT RIGHT UPPER ARM  x Do not stick graft for 4 weeks    If you have any questions, please call the office at [phone].  Prescriptions given: oxycodone 10mg  q6h prn pain  #12 No Refill  Disposition: home  Patient's condition: is Good  Follow up: 1. Dr. Oneida Alar in 2 weeks   Leontine Locket, PA-C [facility] [phone] 11/22/2017  9:57 AM

## 2017-11-22 NOTE — Care Management Obs Status (Signed)
[facility] NOTIFICATION   Patient Details  Name: [name] MRN: [id number] Date of Birth: July 16, 1960   Medicare Observation Status Notification Given:  Yes    Kristen Cardinal, RN 11/22/2017, 1:51 PM

## 2017-11-22 NOTE — Transfer of Care (Signed)
Immediate Anesthesia Transfer of Care Note  Patient: [name]  Procedure(s) Performed: INSERTION OF ARTERIOVENOUS (AV) GORE-TEX GRAFT RIGHT UPPER ARM (Right Arm Upper)  Patient Location: PACU  Anesthesia Type:General  Level of Consciousness: awake, alert  and oriented  Airway & Oxygen Therapy: Patient Spontanous Breathing and Patient connected to nasal cannula oxygen  Post-op Assessment: Report given to RN, Post -op Vital signs reviewed and stable and Patient moving all extremities  Post vital signs: Reviewed and stable  Last Vitals:  Vitals Value Taken Time  BP 140/69 11/22/2017  9:59 AM  Temp    Pulse 98 11/22/2017 10:02 AM  Resp 14 11/22/2017 10:02 AM  SpO2 98 % 11/22/2017 10:02 AM  Vitals shown include unvalidated device data.  Last Pain:  Vitals:   11/22/17 0532  TempSrc: Oral  PainSc:          Complications: No apparent anesthesia complications

## 2017-11-22 NOTE — Discharge Instructions (Signed)
° °  [facility]  Discharge Instructions  AV Fistula or Graft Surgery for Dialysis Access  Please refer to the following instructions for your post-procedure care. Your surgeon or physician assistant will discuss any changes with you.  Activity  You may drive the day following your surgery, if you are comfortable and no longer taking prescription pain medication. Resume full activity as the soreness in your incision resolves.  Bathing/Showering  You may shower after you go home. Keep your incision dry for 48 hours. Do not soak in a bathtub, hot tub, or swim until the incision heals completely. You may not shower if you have a hemodialysis catheter.  Incision Care  Clean your incision with mild soap and water after 48 hours. Pat the area dry with a clean towel. You do not need a bandage unless otherwise instructed. Do not apply any ointments or creams to your incision. You may have skin glue on your incision. Do not peel it off. It will come off on its own in about one week. Your arm may swell a bit after surgery. To reduce swelling use pillows to elevate your arm so it is above your heart. Your doctor will tell you if you need to lightly wrap your arm with an ACE bandage.  Diet  Resume your normal diet. There are not special food restrictions following this procedure. In order to heal from your surgery, it is CRITICAL to get adequate nutrition. Your body requires vitamins, minerals, and protein. Vegetables are the best source of vitamins and minerals. Vegetables also provide the perfect balance of protein. Processed food has little nutritional value, so try to avoid this.  Medications  Resume taking all of your medications. If your incision is causing pain, you may take over-the counter pain relievers such as acetaminophen (Tylenol). If you were prescribed a stronger pain medication, please be aware these medications can cause nausea and constipation. Prevent  nausea by taking the medication with a snack or meal. Avoid constipation by drinking plenty of fluids and eating foods with high amount of fiber, such as fruits, vegetables, and grains.  Do not take Tylenol if you are taking prescription pain medications.  Follow up Your surgeon may want to see you in the office following your access surgery. If so, this will be arranged at the time of your surgery.  Please call us immediately for any of the following conditions:  Increased pain, redness, drainage (pus) from your incision site Fever of 101 degrees or higher Severe or worsening pain at your incision site Hand pain or numbness.  Reduce your risk of vascular disease:  Stop smoking. If you would like help, call [facility] at 1-800-QUIT-NOW (240) 059-0459) or [place] at [place] your cholesterol Maintain a desired weight Control your diabetes Keep your blood pressure down  Dialysis  It will take several weeks to several months for your new dialysis access to be ready for use. Your surgeon will determine when it is okay to use it. Your nephrologist will continue to direct your dialysis. You can continue to use your Permcath until your new access is ready for use.   11/22/2017 [name] [id number] 27-Jun-1961  Surgeon(s): Fields, Jessy Oto, MD  Procedure(s): INSERTION OF ARTERIOVENOUS (AV) GORE-TEX GRAFT RIGHT UPPER ARM  x Do not stick graft for 4 weeks    If you have any questions, please call the office at [phone].

## 2017-11-22 NOTE — Care Management CC44 (Signed)
Condition Code 44 Documentation Completed  Patient Details  Name: [name] MRN: [id number] Date of Birth: 1961-01-08   Condition Code 44 given:  Yes Patient signature on Condition Code 44 notice:  (Patient refused) Documentation of 2 MD's agreement:  Yes Code 44 added to claim:  Yes    Kristen Cardinal, RN 11/22/2017, 1:51 PM

## 2017-11-22 NOTE — Telephone Encounter (Signed)
-----   Message from Elam Dutch, MD sent at 11/22/2017  9:53 AM EDT -----  Right upper arm graft Samantha asst  He needs post op appt with me 2-3 weeks  Ruta Hinds

## 2017-11-22 NOTE — Progress Notes (Signed)
Pt requests to go up to room to have a BM

## 2017-11-22 NOTE — Anesthesia Postprocedure Evaluation (Addendum)
Anesthesia Post Note  Patient: [name]  Procedure(s) Performed: INSERTION OF ARTERIOVENOUS (AV) GORE-TEX GRAFT RIGHT UPPER ARM (Right Arm Upper)     Patient location during evaluation: PACU Anesthesia Type: General Level of consciousness: awake and alert Pain management: pain level controlled Vital Signs Assessment: post-procedure vital signs reviewed and stable Respiratory status: spontaneous breathing, nonlabored ventilation, respiratory function stable and patient connected to nasal cannula oxygen Cardiovascular status: blood pressure returned to baseline and stable Postop Assessment: no apparent nausea or vomiting Anesthetic complications: no    Last Vitals:  Vitals:   11/22/17 1010 11/22/17 1041  BP: 116/64 112/70  Pulse: (!) 102 (!) 124  Resp: 20 18  Temp:    SpO2: (!) 88% 91%    Last Pain:  Vitals:   11/22/17 1010  TempSrc:   PainSc: 0-No pain                 [name] [name]

## 2017-11-23 ENCOUNTER — Encounter (HOSPITAL_COMMUNITY): Payer: Self-pay | Admitting: Vascular Surgery

## 2017-11-23 DIAGNOSIS — D631 Anemia in chronic kidney disease: Secondary | ICD-10-CM | POA: Diagnosis not present

## 2017-11-23 DIAGNOSIS — N186 End stage renal disease: Secondary | ICD-10-CM | POA: Diagnosis not present

## 2017-11-23 DIAGNOSIS — D509 Iron deficiency anemia, unspecified: Secondary | ICD-10-CM | POA: Diagnosis not present

## 2017-11-23 DIAGNOSIS — N2581 Secondary hyperparathyroidism of renal origin: Secondary | ICD-10-CM | POA: Diagnosis not present

## 2017-11-23 DIAGNOSIS — A499 Bacterial infection, unspecified: Secondary | ICD-10-CM | POA: Diagnosis not present

## 2017-11-23 DIAGNOSIS — K652 Spontaneous bacterial peritonitis: Secondary | ICD-10-CM | POA: Diagnosis not present

## 2017-11-23 NOTE — Addendum Note (Signed)
Addendum  created 11/23/17 1143 by Izora Gala, CRNA   Intraprocedure Event edited

## 2017-11-25 DIAGNOSIS — A499 Bacterial infection, unspecified: Secondary | ICD-10-CM | POA: Diagnosis not present

## 2017-11-25 DIAGNOSIS — D509 Iron deficiency anemia, unspecified: Secondary | ICD-10-CM | POA: Diagnosis not present

## 2017-11-25 DIAGNOSIS — N2581 Secondary hyperparathyroidism of renal origin: Secondary | ICD-10-CM | POA: Diagnosis not present

## 2017-11-25 DIAGNOSIS — D631 Anemia in chronic kidney disease: Secondary | ICD-10-CM | POA: Diagnosis not present

## 2017-11-25 DIAGNOSIS — N186 End stage renal disease: Secondary | ICD-10-CM | POA: Diagnosis not present

## 2017-11-25 DIAGNOSIS — K652 Spontaneous bacterial peritonitis: Secondary | ICD-10-CM | POA: Diagnosis not present

## 2017-11-25 NOTE — Addendum Note (Signed)
Addendum  created 11/25/17 1001 by Izora Gala, CRNA   Intraprocedure Event edited

## 2017-11-26 ENCOUNTER — Other Ambulatory Visit: Payer: Self-pay

## 2017-11-26 ENCOUNTER — Inpatient Hospital Stay (HOSPITAL_COMMUNITY)
Admission: EM | Admit: 2017-11-26 | Discharge: 2017-12-01 | DRG: 371 | Disposition: A | Discharge status: Home / Self Care | Payer: Medicare Other | Attending: Internal Medicine | Admitting: Internal Medicine

## 2017-11-26 ENCOUNTER — Encounter (HOSPITAL_COMMUNITY): Payer: Self-pay | Admitting: Emergency Medicine

## 2017-11-26 DIAGNOSIS — D631 Anemia in chronic kidney disease: Secondary | ICD-10-CM | POA: Diagnosis present

## 2017-11-26 DIAGNOSIS — R000 Tachycardia, unspecified: Secondary | ICD-10-CM | POA: Diagnosis present

## 2017-11-26 DIAGNOSIS — Z9119 Patient's noncompliance with other medical treatment and regimen: Secondary | ICD-10-CM

## 2017-11-26 DIAGNOSIS — Z955 Presence of coronary angioplasty implant and graft: Secondary | ICD-10-CM

## 2017-11-26 DIAGNOSIS — K652 Spontaneous bacterial peritonitis: Principal | ICD-10-CM | POA: Diagnosis present

## 2017-11-26 DIAGNOSIS — R1084 Generalized abdominal pain: Secondary | ICD-10-CM

## 2017-11-26 DIAGNOSIS — F1010 Alcohol abuse, uncomplicated: Secondary | ICD-10-CM | POA: Diagnosis present

## 2017-11-26 DIAGNOSIS — Z6822 Body mass index (BMI) 22.0-22.9, adult: Secondary | ICD-10-CM

## 2017-11-26 DIAGNOSIS — K7210 Chronic hepatic failure without coma: Secondary | ICD-10-CM | POA: Diagnosis present

## 2017-11-26 DIAGNOSIS — Z7902 Long term (current) use of antithrombotics/antiplatelets: Secondary | ICD-10-CM

## 2017-11-26 DIAGNOSIS — Z825 Family history of asthma and other chronic lower respiratory diseases: Secondary | ICD-10-CM

## 2017-11-26 DIAGNOSIS — Z86010 Personal history of colonic polyps: Secondary | ICD-10-CM

## 2017-11-26 DIAGNOSIS — T82898A Other specified complication of vascular prosthetic devices, implants and grafts, initial encounter: Secondary | ICD-10-CM | POA: Diagnosis present

## 2017-11-26 DIAGNOSIS — R140 Abdominal distension (gaseous): Secondary | ICD-10-CM | POA: Diagnosis not present

## 2017-11-26 DIAGNOSIS — Z638 Other specified problems related to primary support group: Secondary | ICD-10-CM

## 2017-11-26 DIAGNOSIS — K7031 Alcoholic cirrhosis of liver with ascites: Secondary | ICD-10-CM | POA: Diagnosis not present

## 2017-11-26 DIAGNOSIS — L03113 Cellulitis of right upper limb: Secondary | ICD-10-CM | POA: Diagnosis not present

## 2017-11-26 DIAGNOSIS — N186 End stage renal disease: Secondary | ICD-10-CM | POA: Diagnosis not present

## 2017-11-26 DIAGNOSIS — F17210 Nicotine dependence, cigarettes, uncomplicated: Secondary | ICD-10-CM | POA: Diagnosis present

## 2017-11-26 DIAGNOSIS — K7290 Hepatic failure, unspecified without coma: Secondary | ICD-10-CM | POA: Diagnosis present

## 2017-11-26 DIAGNOSIS — I700 Atherosclerosis of aorta: Secondary | ICD-10-CM | POA: Diagnosis present

## 2017-11-26 DIAGNOSIS — Z79899 Other long term (current) drug therapy: Secondary | ICD-10-CM

## 2017-11-26 DIAGNOSIS — I120 Hypertensive chronic kidney disease with stage 5 chronic kidney disease or end stage renal disease: Secondary | ICD-10-CM | POA: Diagnosis present

## 2017-11-26 DIAGNOSIS — T827XXA Infection and inflammatory reaction due to other cardiac and vascular devices, implants and grafts, initial encounter: Secondary | ICD-10-CM | POA: Diagnosis present

## 2017-11-26 DIAGNOSIS — J449 Chronic obstructive pulmonary disease, unspecified: Secondary | ICD-10-CM | POA: Diagnosis present

## 2017-11-26 DIAGNOSIS — I480 Paroxysmal atrial fibrillation: Secondary | ICD-10-CM | POA: Diagnosis present

## 2017-11-26 DIAGNOSIS — I050 Rheumatic mitral stenosis: Secondary | ICD-10-CM | POA: Diagnosis present

## 2017-11-26 DIAGNOSIS — Z87442 Personal history of urinary calculi: Secondary | ICD-10-CM

## 2017-11-26 DIAGNOSIS — K529 Noninfective gastroenteritis and colitis, unspecified: Secondary | ICD-10-CM | POA: Diagnosis present

## 2017-11-26 DIAGNOSIS — N2581 Secondary hyperparathyroidism of renal origin: Secondary | ICD-10-CM | POA: Diagnosis not present

## 2017-11-26 DIAGNOSIS — D696 Thrombocytopenia, unspecified: Secondary | ICD-10-CM | POA: Diagnosis present

## 2017-11-26 DIAGNOSIS — E8889 Other specified metabolic disorders: Secondary | ICD-10-CM | POA: Diagnosis present

## 2017-11-26 DIAGNOSIS — Z801 Family history of malignant neoplasm of trachea, bronchus and lung: Secondary | ICD-10-CM

## 2017-11-26 DIAGNOSIS — R52 Pain, unspecified: Secondary | ICD-10-CM | POA: Diagnosis not present

## 2017-11-26 DIAGNOSIS — I2510 Atherosclerotic heart disease of native coronary artery without angina pectoris: Secondary | ICD-10-CM | POA: Diagnosis present

## 2017-11-26 DIAGNOSIS — Z888 Allergy status to other drugs, medicaments and biological substances status: Secondary | ICD-10-CM

## 2017-11-26 DIAGNOSIS — Z885 Allergy status to narcotic agent status: Secondary | ICD-10-CM

## 2017-11-26 DIAGNOSIS — B181 Chronic viral hepatitis B without delta-agent: Secondary | ICD-10-CM | POA: Diagnosis not present

## 2017-11-26 DIAGNOSIS — I252 Old myocardial infarction: Secondary | ICD-10-CM

## 2017-11-26 DIAGNOSIS — B182 Chronic viral hepatitis C: Secondary | ICD-10-CM | POA: Diagnosis present

## 2017-11-26 DIAGNOSIS — E440 Moderate protein-calorie malnutrition: Secondary | ICD-10-CM | POA: Diagnosis not present

## 2017-11-26 DIAGNOSIS — N189 Chronic kidney disease, unspecified: Secondary | ICD-10-CM | POA: Diagnosis present

## 2017-11-26 DIAGNOSIS — K219 Gastro-esophageal reflux disease without esophagitis: Secondary | ICD-10-CM | POA: Diagnosis present

## 2017-11-26 DIAGNOSIS — I10 Essential (primary) hypertension: Secondary | ICD-10-CM | POA: Diagnosis present

## 2017-11-26 DIAGNOSIS — Z886 Allergy status to analgesic agent status: Secondary | ICD-10-CM

## 2017-11-26 DIAGNOSIS — F419 Anxiety disorder, unspecified: Secondary | ICD-10-CM | POA: Diagnosis present

## 2017-11-26 DIAGNOSIS — Z800 Family history of malignant neoplasm of digestive organs: Secondary | ICD-10-CM

## 2017-11-26 DIAGNOSIS — Z7951 Long term (current) use of inhaled steroids: Secondary | ICD-10-CM

## 2017-11-26 DIAGNOSIS — Z79891 Long term (current) use of opiate analgesic: Secondary | ICD-10-CM

## 2017-11-26 DIAGNOSIS — Z8249 Family history of ischemic heart disease and other diseases of the circulatory system: Secondary | ICD-10-CM

## 2017-11-26 DIAGNOSIS — Y832 Surgical operation with anastomosis, bypass or graft as the cause of abnormal reaction of the patient, or of later complication, without mention of misadventure at the time of the procedure: Secondary | ICD-10-CM | POA: Diagnosis present

## 2017-11-26 DIAGNOSIS — Z992 Dependence on renal dialysis: Secondary | ICD-10-CM

## 2017-11-26 DIAGNOSIS — G8929 Other chronic pain: Secondary | ICD-10-CM | POA: Diagnosis present

## 2017-11-26 DIAGNOSIS — Z808 Family history of malignant neoplasm of other organs or systems: Secondary | ICD-10-CM

## 2017-11-26 DIAGNOSIS — D6959 Other secondary thrombocytopenia: Secondary | ICD-10-CM | POA: Diagnosis present

## 2017-11-26 DIAGNOSIS — K7460 Unspecified cirrhosis of liver: Secondary | ICD-10-CM | POA: Diagnosis present

## 2017-11-26 LAB — CBC
HCT: 24.2 % — ABNORMAL LOW (ref 39.0–52.0)
Hemoglobin: 7.9 g/dL — ABNORMAL LOW (ref 13.0–17.0)
MCH: 23.9 pg — ABNORMAL LOW (ref 26.0–34.0)
MCHC: 32.6 g/dL (ref 30.0–36.0)
MCV: 73.1 fL — ABNORMAL LOW (ref 78.0–100.0)
Platelets: 204 10*3/uL (ref 150–400)
RBC: 3.31 MIL/uL — ABNORMAL LOW (ref 4.22–5.81)
RDW: 21.3 % — ABNORMAL HIGH (ref 11.5–15.5)
WBC: 12.3 10*3/uL — ABNORMAL HIGH (ref 4.0–10.5)

## 2017-11-26 LAB — COMPREHENSIVE METABOLIC PANEL
ALT: 9 U/L — ABNORMAL LOW (ref 17–63)
AST: 27 U/L (ref 15–41)
Albumin: 2.6 g/dL — ABNORMAL LOW (ref 3.5–5.0)
Alkaline Phosphatase: 213 U/L — ABNORMAL HIGH (ref 38–126)
Anion gap: 15 (ref 5–15)
BUN: 26 mg/dL — ABNORMAL HIGH (ref 6–20)
CO2: 28 mmol/L (ref 22–32)
Calcium: 8.3 mg/dL — ABNORMAL LOW (ref 8.9–10.3)
Chloride: 89 mmol/L — ABNORMAL LOW (ref 101–111)
Creatinine, Ser: 6.73 mg/dL — ABNORMAL HIGH (ref 0.61–1.24)
GFR calc Af Amer: 10 mL/min — ABNORMAL LOW (ref >60)
GFR calc non Af Amer: 8 mL/min — ABNORMAL LOW (ref >60)
Glucose, Bld: 130 mg/dL — ABNORMAL HIGH (ref 65–99)
Potassium: 4.7 mmol/L (ref 3.5–5.1)
Sodium: 132 mmol/L — ABNORMAL LOW (ref 135–145)
Total Bilirubin: 0.7 mg/dL (ref 0.3–1.2)
Total Protein: 6.5 g/dL (ref 6.5–8.1)

## 2017-11-26 LAB — I-STAT VENOUS BLOOD GAS, ED
Acid-Base Excess: 8 mmol/L — ABNORMAL HIGH (ref 0.0–2.0)
Bicarbonate: 31.1 mmol/L — ABNORMAL HIGH (ref 20.0–28.0)
O2 Saturation: 96 %
TCO2: 32 mmol/L (ref 22–32)
pCO2, Ven: 37.1 mmHg — ABNORMAL LOW (ref 44.0–60.0)
pH, Ven: 7.531 — ABNORMAL HIGH (ref 7.250–7.430)
pO2, Ven: 72 mmHg — ABNORMAL HIGH (ref 32.0–45.0)

## 2017-11-26 LAB — CBG MONITORING, ED: Glucose-Capillary: 106 mg/dL — ABNORMAL HIGH (ref 65–99)

## 2017-11-26 LAB — LIPASE, BLOOD: Lipase: 24 U/L (ref 11–51)

## 2017-11-26 LAB — I-STAT CG4 LACTIC ACID, ED
Lactic Acid, Venous: 1.56 mmol/L (ref 0.5–1.9)
Lactic Acid, Venous: 1.01 mmol/L (ref 0.5–1.9)

## 2017-11-26 MED ORDER — IPRATROPIUM-ALBUTEROL 0.5-2.5 (3) MG/3ML IN SOLN
3.0000 mL | Freq: Once | RESPIRATORY_TRACT | Status: DC
  Filled 2017-11-26: qty 3

## 2017-11-26 MED ORDER — DEXTROSE 50 % IV SOLN: 25.0000 mL | Freq: Once | INTRAVENOUS | Status: DC

## 2017-11-26 MED ORDER — INSULIN ASPART 100 UNIT/ML IV SOLN
5.0000 [IU] | Freq: Once | INTRAVENOUS | Status: DC
  Filled 2017-11-26: qty 0.05

## 2017-11-26 NOTE — ED Provider Notes (Signed)
[facility] EMERGENCY DEPARTMENT Provider Note   CSN: [id number] Arrival date & time: 11/26/17  2044     History   Chief Complaint Chief Complaint  Patient presents with  . Arm Pain  . Abdominal Pain    HPI [name] is a 57 y.o. male.  Patient presents with multiple complaints.  Patient has a history of end-stage renal disease, on dialysis as well as alcoholic cirrhosis with ascites.  He comes to the ER tonight with complaints of abdominal pain.  He thinks that he is filling up with fluid again.  Patient reports constant, severe, diffuse abdominal pain.  No associated vomiting, diarrhea, constipation.  Patient also complaining of right arm pain.  He reports that he recently had a fistula placed in his right arm for dialysis access, over the last several days he has had a progressive pain and swelling of his right upper arm.  Pain is constant and severe, worsens if he tries to flex or extend the arm.  No drainage.     Past Medical History:  Diagnosis Date  . Adenomatous colon polyp    tubular  . Anemia   . Anxiety   . Arthritis    left shoulder  . Atherosclerosis of aorta ([place])   . Cardiomegaly   . Chest pain    DATE UNKNOWN, C/O PERIODICALLY  . Cocaine abuse ([place])   . COPD exacerbation ([place]) 08/17/2016  . Coronary artery disease    stent 02/22/17  . Dialysis patient ([place])    Monday-Wednesday-Friday  . ESRD (end stage renal disease) on dialysis ([place])    "[name]; MWF" (07/04/2017)  . GERD (gastroesophageal reflux disease)    DATE UNKNOWN  . Hemorrhoids   . Hepatitis B, chronic ([place])   . Hepatitis C   . History of kidney stones   . Hyperkalemia   . Hypertension   . Metabolic bone disease    Patient denies  . Mitral stenosis   . Myocardial infarction ([place])   . Pneumonia   . Pulmonary edema   . Renal disorder   . Renal insufficiency   . Shortness of breath dyspnea    " for the last past year with this dialysis"  . Solitary  rectal ulcer syndrome   . Tubular adenoma of colon     Patient Active Problem List   Diagnosis Date Noted  . ESRD (end stage renal disease) ([place]) 11/21/2017  . GERD (gastroesophageal reflux disease) 11/16/2017  . Liver cirrhosis ([place]) 11/15/2017  . DNR (do not resuscitate)   . Palliative care by specialist   . Hyponatremia 11/04/2017  . SBP (spontaneous bacterial peritonitis) ([place]) 10/30/2017  . Liver disease, chronic 10/30/2017  . SOB (shortness of breath)   . Abdominal pain 10/28/2017  . Upper airway cough syndrome with flattening on f/v loop 10/13/17 c/w vcd 10/17/2017  . Elevated diaphragm 10/13/2017  . Ileus ([place]) 09/29/2017  . QT prolongation 09/29/2017  . Malnutrition of moderate degree 09/29/2017  . Sinus congestion 09/03/2017  . Symptomatic anemia 09/02/2017  . Other cirrhosis of liver ([place]) 09/02/2017  . Left bundle branch block 09/02/2017  . Mitral stenosis 09/02/2017  . Hematochezia 07/15/2017  . Wide-complex tachycardia ([place])   . Endotracheally intubated   . ESRD on dialysis ([place]) 07/04/2017  . Acute respiratory failure with hypoxia ([place]) 06/18/2017  . CKD (chronic kidney disease) stage V requiring chronic dialysis ([place]) 06/18/2017  . History of Cocaine abuse ([place]) 06/18/2017  . Hypertension 06/18/2017  .  Infection of AV graft for dialysis ([place]) 06/18/2017  . Anxiety 06/18/2017  . Anemia due to chronic kidney disease 06/18/2017  . Atrial flutter with rapid ventricular response ([place]) 06/18/2017  . Personality disorder ([place]) 06/13/2017  . Cellulitis 06/12/2017  . Adjustment disorder with mixed anxiety and depressed mood 06/10/2017  . Suicidal ideation 06/10/2017  . Arm wound, left, sequela 06/10/2017  . Dyspnea on exertion 05/29/2017  . Tachycardia 05/29/2017  . Hyperkalemia 05/22/2017  . Acute metabolic encephalopathy   . Anemia 04/23/2017  . Ascites 04/23/2017  . COPD (chronic obstructive pulmonary disease) ([place]) 04/23/2017  . Acute on chronic respiratory  failure with hypoxia ([place]) 03/25/2017  . Arrhythmia 03/25/2017  . COPD GOLD 0 with flattening on inps f/v  09/27/2016  . Essential hypertension 09/27/2016  . Fluid overload 08/30/2016  . COPD exacerbation ([place]) 08/17/2016  . Hypertensive urgency 08/17/2016  . Respiratory failure ([place]) 08/17/2016  . Problem with dialysis access ([place]) 07/23/2016  . Chronic hepatitis B ([place]) 03/05/2014  . Chronic hepatitis C without hepatic coma ([place]) 03/05/2014  . Internal hemorrhoids with bleeding, swelling and itching 03/05/2014  . Thrombocytopenia ([place]) 03/05/2014  . Chest pain 02/27/2014  . Alcohol abuse 04/14/2009  . Cigarette smoker 04/14/2009  . GANGLION CYST 04/14/2009    Past Surgical History:  Procedure Laterality Date  . A/V FISTULAGRAM Left 05/26/2017   Procedure: A/V FISTULAGRAM;  Surgeon: Conrad Menoken, MD;  Location: [place] [facility];  Service: Cardiovascular;  Laterality: Left;  . A/V FISTULAGRAM Right 11/18/2017   Procedure: A/V FISTULAGRAM - Right Arm;  Surgeon: Elam Dutch, MD;  Location: [place] [facility];  Service: Cardiovascular;  Laterality: Right;  . APPLICATION OF WOUND VAC Left 06/14/2017   Procedure: APPLICATION OF WOUND VAC;  Surgeon: Katha Cabal, MD;  Location: [facility];  Service: Vascular;  Laterality: Left;  . AV FISTULA PLACEMENT  2012   BELIEVED WAS PLACED IN JUNE  . AV FISTULA PLACEMENT Right 08/09/2017   Procedure: Creation Right arm ARTERIOVENOUS BRACHIOCEPOHALIC FISTULA;  Surgeon: Elam Dutch, MD;  Location: [facility] OR;  Service: Vascular;  Laterality: Right;  . AV FISTULA PLACEMENT Right 11/22/2017   Procedure: INSERTION OF ARTERIOVENOUS (AV) GORE-TEX GRAFT RIGHT UPPER ARM;  Surgeon: Elam Dutch, MD;  Location: [place];  Service: Vascular;  Laterality: Right;  . COLONOSCOPY    . CORONARY STENT INTERVENTION N/A 02/22/2017   Procedure: CORONARY STENT INTERVENTION;  Surgeon: Nigel Mormon, MD;  Location: [place] [facility];  Service:  Cardiovascular;  Laterality: N/A;  . FLEXIBLE SIGMOIDOSCOPY N/A 07/15/2017   Procedure: FLEXIBLE SIGMOIDOSCOPY;  Surgeon: Carol Ada, MD;  Location: [place];  Service: Endoscopy;  Laterality: N/A;  . HEMORRHOID BANDING    . I&D EXTREMITY Left 06/01/2017   Procedure: IRRIGATION AND DEBRIDEMENT LEFT ARM HEMATOMA WITH LIGATION OF LEFT ARM AV FISTULA;  Surgeon: Elam Dutch, MD;  Location: [place];  Service: Vascular;  Laterality: Left;  . I&D EXTREMITY Left 06/14/2017   Procedure: IRRIGATION AND DEBRIDEMENT EXTREMITY;  Surgeon: Katha Cabal, MD;  Location: [facility];  Service: Vascular;  Laterality: Left;  . INSERTION OF DIALYSIS CATHETER  05/30/2017  . INSERTION OF DIALYSIS CATHETER N/A 05/30/2017   Procedure: INSERTION OF DIALYSIS CATHETER;  Surgeon: Elam Dutch, MD;  Location: [place];  Service: Vascular;  Laterality: N/A;  . IR PARACENTESIS  08/30/2017  . IR PARACENTESIS  09/29/2017  . IR PARACENTESIS  10/28/2017  . IR PARACENTESIS  11/09/2017  .  IR PARACENTESIS  11/16/2017  . LEFT HEART CATH AND CORONARY ANGIOGRAPHY N/A 02/22/2017   Procedure: LEFT HEART CATH AND CORONARY ANGIOGRAPHY;  Surgeon: Nigel Mormon, MD;  Location: [place] [facility];  Service: Cardiovascular;  Laterality: N/A;  . LIGATION OF ARTERIOVENOUS  FISTULA Left 09/13/3612   Procedure: Plication of Left Arm Arteriovenous Fistula;  Surgeon: Elam Dutch, MD;  Location: [place];  Service: Vascular;  Laterality: Left;  . POLYPECTOMY    . REVISON OF ARTERIOVENOUS FISTULA Left 4/31/5400   Procedure: PLICATION OF DISTAL ANEURYSMAL SEGEMENT OF LEFT UPPER ARM ARTERIOVENOUS FISTULA;  Surgeon: Elam Dutch, MD;  Location: [place];  Service: Vascular;  Laterality: Left;  . REVISON OF ARTERIOVENOUS FISTULA Left 8/67/6195   Procedure: Plication of Left Upper Arm Fistula ;  Surgeon: Waynetta Sandy, MD;  Location: [place];  Service: Vascular;  Laterality: Left;  . SKIN GRAFT SPLIT THICKNESS LEG / FOOT Left     SKIN GRAFT SPLIT THICKNESS LEFT ARM DONOR SITE: LEFT ANTERIOR THIGH  . SKIN SPLIT GRAFT Left 07/04/2017   Procedure: SKIN GRAFT SPLIT THICKNESS LEFT ARM DONOR SITE: LEFT ANTERIOR THIGH;  Surgeon: Elam Dutch, MD;  Location: [place];  Service: Vascular;  Laterality: Left;  . THROMBECTOMY W/ EMBOLECTOMY Left 06/05/2017   Procedure: EXPLORATION OF LEFT ARM FOR BLEEDING; OVERSEWED PROXIMAL FISTULA;  Surgeon: Angelia Mould, MD;  Location: [place];  Service: Vascular;  Laterality: Left;  . WOUND EXPLORATION Left 06/03/2017   Procedure: WOUND EXPLORATION WITH WOUND VAC APPLICATION TO LEFT ARM;  Surgeon: Angelia Mould, MD;  Location: [place];  Service: Vascular;  Laterality: Left;        Home Medications    Prior to Admission medications   Medication Sig Start Date End Date Taking? Authorizing Provider  albuterol (VENTOLIN HFA) 108 (90 Base) MCG/ACT inhaler Inhale 2 puffs into the lungs every 6 (six) hours as needed for wheezing or shortness of breath.   Yes [provider]  budesonide-formoterol (SYMBICORT) 80-4.5 MCG/ACT inhaler Inhale 2 puffs into the lungs 2 (two) times daily. 10/13/17  Yes Tanda Rockers, MD  Ceftazidime (FORTAZ) 2 g SOLR injection Inject 2 g into the vein every other day. To receive on hemodialysis, last dose 11/23/2017. 11/19/17  Yes Arrien, Jimmy Picket, MD  clopidogrel (PLAVIX) 75 MG tablet Take 1 tablet (75 mg total) by mouth daily. 07/15/17  Yes Rhyne, Hulen Shouts, PA-C  diltiazem (CARDIZEM CD) 120 MG 24 hr capsule Take 1 capsule (120 mg total) by mouth daily. 06/23/17 06/23/18 Yes Shahmehdi, Valeria Batman, MD  famotidine (PEPCID) 20 MG tablet One at bedtime Patient taking differently: Take 20 mg by mouth at bedtime.  09/08/17  Yes Tanda Rockers, MD  fluticasone (FLONASE) 50 MCG/ACT nasal spray Place 2 sprays into both nostrils daily. 10/31/17  Yes Eugenie Filler, MD  gabapentin (NEURONTIN) 100 MG capsule Take 1 capsule (100 mg total) by mouth 2  (two) times daily. 11/22/17  Yes Rhyne, Samantha J, PA-C  hydrALAZINE (APRESOLINE) 100 MG tablet Take 1 tablet by mouth twice a day take after HD on dialysis days 08/31/17  Yes [provider]  isosorbide mononitrate (IMDUR) 30 MG 24 hr tablet Take 1 tablet (30 mg total) by mouth daily. Patient taking differently: Take 30 mg by mouth daily. ER 06/17/17  Yes [name], MD  lactulose [facility]) 10 GM/15ML solution Take 30 mLs (20 g total) by mouth 2 (two) times daily. 11/19/17 12/19/17 Yes Arrien, Mauricio  Quillian Quince, MD  loratadine (CLARITIN) 10 MG tablet Take 1 tablet (10 mg total) by mouth daily. 10/31/17  Yes Eugenie Filler, MD  Nutritional Supplements (FEEDING SUPPLEMENT, NEPRO CARB STEADY,) LIQD Take 237 mLs by mouth 2 (two) times daily between meals. 09/30/17  Yes Mariel Aloe, MD  Oxycodone HCl 10 MG TABS Take 1 tablet (10 mg total) by mouth every 6 (six) hours as needed. 11/22/17  Yes Rhyne, Samantha J, PA-C  pantoprazole (PROTONIX) 40 MG tablet Take 1 tablet (40 mg total) by mouth daily. Take 30-60 min before first meal of the day 09/08/17  Yes Tanda Rockers, MD  sevelamer carbonate (RENVELA) 800 MG tablet Take 1,600-3,200 mg by mouth See admin instructions. Take 4 tablets (800mg  each) three times daily and 2 tablets twice daily with a snack. 10/31/17  Yes [provider]  tiotropium (SPIRIVA) 18 MCG inhalation capsule Place 18 mcg into inhaler and inhale daily.   Yes [provider]    Family History Family History  Problem Relation Age of Onset  . Heart disease Mother   . Lung cancer Mother   . Heart disease Father   . Malignant hyperthermia Father   . COPD Father   . Throat cancer Sister   . Esophageal cancer Sister   . Hypertension Other   . COPD Other   . Colon cancer Neg Hx   . Colon polyps Neg Hx   . Rectal cancer Neg Hx   . Stomach cancer Neg Hx     Social History Social History   Tobacco Use  . Smoking status: Current Every Day Smoker     Packs/day: 0.50    Years: 43.00    Pack years: 21.50    Types: Cigarettes    Start date: 08/13/1973  . Smokeless tobacco: Never Used  Substance Use Topics  . Alcohol use: Not Currently    Frequency: Never    Comment: quit drinking in 2017  . Drug use: Not Currently    Comment: quit in 2017"     Allergies   Aspirin; Clonidine derivatives; Tramadol; and Tylenol [acetaminophen]   Review of Systems Review of Systems  Gastrointestinal: Positive for abdominal pain.  Skin: Positive for color change.  All other systems reviewed and are negative.    Physical Exam Updated Vital Signs BP 126/79   Pulse 95   Temp 99.1 F (37.3 C) (Oral)   Resp (!) 22   SpO2 98%   Physical Exam  Constitutional: He is oriented to person, place, and time. He appears well-developed and well-nourished. No distress.  HENT:  Head: Normocephalic and atraumatic.  Right Ear: Hearing normal.  Left Ear: Hearing normal.  Nose: Nose normal.  Mouth/Throat: Oropharynx is clear and moist and mucous membranes are normal.  Eyes: Pupils are equal, round, and reactive to light. Conjunctivae and EOM are normal.  Neck: Normal range of motion. Neck supple.  Cardiovascular: Regular rhythm, S1 normal and S2 normal. Tachycardia present. Exam reveals no gallop and no friction rub.  No murmur heard. Pulmonary/Chest: Effort normal and breath sounds normal. No respiratory distress. He exhibits no tenderness.  Abdominal: Soft. Normal appearance. He exhibits distension. Bowel sounds are decreased. There is no hepatosplenomegaly. There is generalized tenderness. There is no rebound, no guarding, no tenderness at McBurney's point and negative Murphy's sign. No hernia.  Musculoskeletal: Normal range of motion.       Right elbow: He exhibits swelling.       Right upper arm: He exhibits  swelling.  Neurological: He is alert and oriented to person, place, and time. He has normal strength. No cranial nerve deficit or sensory deficit.  Coordination normal. GCS eye subscore is 4. GCS verbal subscore is 5. GCS motor subscore is 6.  Skin: Skin is warm, dry and intact. No rash noted. There is erythema. No cyanosis.  Erythema and induration and anterior aspect of right upper arm and antecubital fossa area of elbow  Graft palpable right upper arm, tender to the touch, no thrill    Psychiatric: He has a normal mood and affect. His speech is normal and behavior is normal. Thought content normal.  Nursing note and vitals reviewed.    ED Treatments / Results  Labs (all labs ordered are listed, but only abnormal results are displayed) Labs Reviewed  COMPREHENSIVE METABOLIC PANEL - Abnormal; Notable for the following components:      Result Value   Sodium 132 (*)    Chloride 89 (*)    Glucose, Bld 130 (*)    BUN 26 (*)    Creatinine, Ser 6.73 (*)    Calcium 8.3 (*)    Albumin 2.6 (*)    ALT 9 (*)    Alkaline Phosphatase 213 (*)    GFR calc non Af Amer 8 (*)    GFR calc Af Amer 10 (*)    All other components within normal limits  CBC - Abnormal; Notable for the following components:   WBC 12.3 (*)    RBC 3.31 (*)    Hemoglobin 7.9 (*)    HCT 24.2 (*)    MCV 73.1 (*)    MCH 23.9 (*)    RDW 21.3 (*)    All other components within normal limits  PROTIME-INR - Abnormal; Notable for the following components:   Prothrombin Time 16.0 (*)    All other components within normal limits  I-STAT VENOUS BLOOD GAS, ED - Abnormal; Notable for the following components:   pH, Ven 7.531 (*)    pCO2, Ven 37.1 (*)    pO2, Ven 72.0 (*)    Bicarbonate 31.1 (*)    Acid-Base Excess 8.0 (*)    All other components within normal limits  CBG MONITORING, ED - Abnormal; Notable for the following components:   Glucose-Capillary 106 (*)    All other components within normal limits  CULTURE, BLOOD (ROUTINE X 2)  CULTURE, BLOOD (ROUTINE X 2)  LIPASE, BLOOD  AMMONIA  URINALYSIS, ROUTINE W REFLEX MICROSCOPIC  I-STAT CG4 LACTIC ACID, ED    I-STAT CG4 LACTIC ACID, ED    EKG None  Radiology Ct Abdomen Pelvis W Contrast  Result Date: 11/27/2017 CLINICAL DATA:  Acute onset of generalized abdominal pain and distention. EXAM: CT ABDOMEN AND PELVIS WITH CONTRAST TECHNIQUE: Multidetector CT imaging of the abdomen and pelvis was performed using the standard protocol following bolus administration of intravenous contrast. CONTRAST:  139mL OMNIPAQUE IOHEXOL 300 MG/ML  SOLN COMPARISON:  CT of the abdomen and pelvis performed 11/16/2017 FINDINGS: Lower chest: Mild bibasilar atelectasis or scarring is noted. Scattered coronary artery calcifications are seen. Calcification is noted at the mitral valve. Hepatobiliary: The nodular contour of the liver is compatible with hepatic cirrhosis. The gallbladder is difficult to fully assess given surrounding ascites. The common bile duct remains normal in caliber. Pancreas: The pancreas is within normal limits. Spleen: The spleen is unremarkable in appearance. Adrenals/Urinary Tract: The adrenal glands are unremarkable in appearance. Severe chronic bilateral renal atrophy is noted, with scattered bilateral renal  cysts and diffuse bilateral vascular calcification. There is no evidence of hydronephrosis. No obstructing ureteral stones are seen. Stomach/Bowel: The stomach is unremarkable in appearance. The small bowel is within normal limits. The appendix is normal in caliber, without evidence of appendicitis. The colon is unremarkable in appearance. Vascular/Lymphatic: Diffuse calcification is seen along the abdominal aorta and its branches. The abdominal aorta is otherwise grossly unremarkable. The inferior vena cava is grossly unremarkable. No retroperitoneal lymphadenopathy is seen. No pelvic sidewall lymphadenopathy is identified. Reproductive: The bladder is decompressed and not well characterized. The prostate is normal in size. Other: Large volume ascites is noted within the abdomen and pelvis. There is soft  tissue density layering dependently within the pelvis. This is of uncertain significance, given that fluid cultures and pathology have been negative. Musculoskeletal: No acute osseous abnormalities are identified. The visualized musculature is unremarkable in appearance. IMPRESSION: 1. Large volume ascites noted within the abdomen and pelvis, with soft tissue density again noted layering dependently in the pelvis. This is of uncertain significance, given that peritoneal fluid cultures and pathology have been negative. 2. Findings of hepatic cirrhosis. 3. Severe chronic bilateral renal atrophy, with scattered bilateral renal cysts. 4. Scattered coronary artery calcifications seen. 5. Mild bibasilar atelectasis or scarring noted. Aortic Atherosclerosis (ICD10-I70.0). Electronically Signed   By: Garald Balding M.D.   On: 11/27/2017 04:17    Procedures Procedures (including critical care time)  Medications Ordered in ED Medications  cefTRIAXone (ROCEPHIN) 2 g in sodium chloride 0.9 % 100 mL IVPB (has no administration in time range)  HYDROmorphone (DILAUDID) injection 1 mg (1 mg Intravenous Given 11/27/17 0404)  iohexol (OMNIPAQUE) 300 MG/ML solution 100 mL (100 mLs Intravenous Contrast Given 11/27/17 0347)     Initial Impression / Assessment and Plan / ED Course  I have reviewed the triage vital signs and the nursing notes.  Pertinent labs & imaging results that were available during my care of the patient were reviewed by me and considered in my medical decision making (see chart for details).     Patient presents to the emergency department for evaluation of multiple problems.  Patient complaining of abdominal distention with pain.  He has a history of alcoholic cirrhosis.  Abdomen is distended and diffusely tender but there is no rebound or signs of peritonitis at this time.  He is afebrile.  He does have a slight leukocytosis, but this appears to be his baseline.  CT scan does confirm large  volume ascites no other pathology.  I suspect he is having pain because of his large volume ascites but cannot rule out spontaneous bacterial peritonitis.  He has been treated for this in the past, but his peritoneal cultures have always been negative.  We will cover him empirically with Rocephin.  Patient also complaining of pain and swelling of the right arm.  He did just have an AV fistula graft placed on May 14.  The area overlying the graft is erythematous, warm, indurated and tender.  This is concerning for infection.  Will cover for skin flora with vancomycin.  Final Clinical Impressions(s) / ED Diagnoses   Final diagnoses:  Cellulitis of right upper extremity  Ascites due to alcoholic cirrhosis [facility])  Generalized abdominal pain    ED Discharge Orders    None       Orpah Greek, MD 11/27/17 [phone]

## 2017-11-26 NOTE — ED Triage Notes (Signed)
Pt reports abdominal pain, reports the "fluid is building up again, it was just drained."  Pt also has swelling, redness and pain to the right arm.  Pt reports chills since Friday, when he got a complete dialysis treatment.

## 2017-11-27 ENCOUNTER — Emergency Department (HOSPITAL_COMMUNITY): Payer: Medicare Other

## 2017-11-27 ENCOUNTER — Other Ambulatory Visit: Payer: Self-pay

## 2017-11-27 DIAGNOSIS — I120 Hypertensive chronic kidney disease with stage 5 chronic kidney disease or end stage renal disease: Secondary | ICD-10-CM | POA: Diagnosis present

## 2017-11-27 DIAGNOSIS — R1084 Generalized abdominal pain: Secondary | ICD-10-CM | POA: Diagnosis not present

## 2017-11-27 DIAGNOSIS — Z8619 Personal history of other infectious and parasitic diseases: Secondary | ICD-10-CM | POA: Diagnosis not present

## 2017-11-27 DIAGNOSIS — Z86010 Personal history of colonic polyps: Secondary | ICD-10-CM | POA: Diagnosis not present

## 2017-11-27 DIAGNOSIS — K7031 Alcoholic cirrhosis of liver with ascites: Secondary | ICD-10-CM | POA: Diagnosis not present

## 2017-11-27 DIAGNOSIS — K7210 Chronic hepatic failure without coma: Secondary | ICD-10-CM | POA: Diagnosis present

## 2017-11-27 DIAGNOSIS — J449 Chronic obstructive pulmonary disease, unspecified: Secondary | ICD-10-CM | POA: Diagnosis present

## 2017-11-27 DIAGNOSIS — R52 Pain, unspecified: Secondary | ICD-10-CM | POA: Diagnosis not present

## 2017-11-27 DIAGNOSIS — K219 Gastro-esophageal reflux disease without esophagitis: Secondary | ICD-10-CM | POA: Diagnosis present

## 2017-11-27 DIAGNOSIS — B181 Chronic viral hepatitis B without delta-agent: Secondary | ICD-10-CM | POA: Diagnosis not present

## 2017-11-27 DIAGNOSIS — T82898A Other specified complication of vascular prosthetic devices, implants and grafts, initial encounter: Secondary | ICD-10-CM | POA: Diagnosis present

## 2017-11-27 DIAGNOSIS — I252 Old myocardial infarction: Secondary | ICD-10-CM | POA: Diagnosis not present

## 2017-11-27 DIAGNOSIS — F17210 Nicotine dependence, cigarettes, uncomplicated: Secondary | ICD-10-CM | POA: Diagnosis not present

## 2017-11-27 DIAGNOSIS — N184 Chronic kidney disease, stage 4 (severe): Secondary | ICD-10-CM | POA: Diagnosis not present

## 2017-11-27 DIAGNOSIS — F419 Anxiety disorder, unspecified: Secondary | ICD-10-CM | POA: Diagnosis not present

## 2017-11-27 DIAGNOSIS — I050 Rheumatic mitral stenosis: Secondary | ICD-10-CM | POA: Diagnosis present

## 2017-11-27 DIAGNOSIS — Z886 Allergy status to analgesic agent status: Secondary | ICD-10-CM | POA: Diagnosis not present

## 2017-11-27 DIAGNOSIS — E440 Moderate protein-calorie malnutrition: Secondary | ICD-10-CM | POA: Diagnosis not present

## 2017-11-27 DIAGNOSIS — F1010 Alcohol abuse, uncomplicated: Secondary | ICD-10-CM | POA: Diagnosis not present

## 2017-11-27 DIAGNOSIS — K652 Spontaneous bacterial peritonitis: Secondary | ICD-10-CM | POA: Diagnosis not present

## 2017-11-27 DIAGNOSIS — Y832 Surgical operation with anastomosis, bypass or graft as the cause of abnormal reaction of the patient, or of later complication, without mention of misadventure at the time of the procedure: Secondary | ICD-10-CM | POA: Diagnosis present

## 2017-11-27 DIAGNOSIS — R188 Other ascites: Secondary | ICD-10-CM | POA: Diagnosis not present

## 2017-11-27 DIAGNOSIS — R140 Abdominal distension (gaseous): Secondary | ICD-10-CM | POA: Diagnosis not present

## 2017-11-27 DIAGNOSIS — Z885 Allergy status to narcotic agent status: Secondary | ICD-10-CM | POA: Diagnosis not present

## 2017-11-27 DIAGNOSIS — Z992 Dependence on renal dialysis: Secondary | ICD-10-CM | POA: Diagnosis not present

## 2017-11-27 DIAGNOSIS — N2581 Secondary hyperparathyroidism of renal origin: Secondary | ICD-10-CM | POA: Diagnosis present

## 2017-11-27 DIAGNOSIS — K529 Noninfective gastroenteritis and colitis, unspecified: Secondary | ICD-10-CM | POA: Diagnosis present

## 2017-11-27 DIAGNOSIS — K766 Portal hypertension: Secondary | ICD-10-CM | POA: Diagnosis not present

## 2017-11-27 DIAGNOSIS — Z9119 Patient's noncompliance with other medical treatment and regimen: Secondary | ICD-10-CM | POA: Diagnosis not present

## 2017-11-27 DIAGNOSIS — D6959 Other secondary thrombocytopenia: Secondary | ICD-10-CM | POA: Diagnosis present

## 2017-11-27 DIAGNOSIS — D631 Anemia in chronic kidney disease: Secondary | ICD-10-CM | POA: Diagnosis not present

## 2017-11-27 DIAGNOSIS — I700 Atherosclerosis of aorta: Secondary | ICD-10-CM | POA: Diagnosis present

## 2017-11-27 DIAGNOSIS — N186 End stage renal disease: Secondary | ICD-10-CM | POA: Diagnosis not present

## 2017-11-27 DIAGNOSIS — Z888 Allergy status to other drugs, medicaments and biological substances status: Secondary | ICD-10-CM | POA: Diagnosis not present

## 2017-11-27 DIAGNOSIS — Z87442 Personal history of urinary calculi: Secondary | ICD-10-CM | POA: Diagnosis not present

## 2017-11-27 DIAGNOSIS — B182 Chronic viral hepatitis C: Secondary | ICD-10-CM | POA: Diagnosis present

## 2017-11-27 DIAGNOSIS — Z6822 Body mass index (BMI) 22.0-22.9, adult: Secondary | ICD-10-CM | POA: Diagnosis not present

## 2017-11-27 DIAGNOSIS — I480 Paroxysmal atrial fibrillation: Secondary | ICD-10-CM | POA: Diagnosis present

## 2017-11-27 LAB — PROTIME-INR
INR: 1.3
Prothrombin Time: 16 seconds — ABNORMAL HIGH (ref 11.4–15.2)

## 2017-11-27 LAB — AMMONIA: Ammonia: 23 umol/L (ref 9–35)

## 2017-11-27 MED ORDER — NEPRO/CARBSTEADY PO LIQD
237.0000 mL | Freq: Two times a day (BID) | ORAL | Status: DC
Start: 2017-11-27 — End: 2017-12-01
  Administered 2017-11-27 – 2017-12-01 (×8): 237 mL via ORAL
  Filled 2017-11-27 (×12): qty 237

## 2017-11-27 MED ORDER — SODIUM CHLORIDE 0.9 % IV SOLN
2.0000 g | INTRAVENOUS | Status: DC
  Filled 2017-11-27: qty 2

## 2017-11-27 MED ORDER — DILTIAZEM HCL ER COATED BEADS 120 MG PO CP24
120.0000 mg | ORAL_CAPSULE | Freq: Every day | ORAL | Status: DC
  Administered 2017-11-27 – 2017-12-01 (×4): 120 mg via ORAL
  Filled 2017-11-27 (×5): qty 1

## 2017-11-27 MED ORDER — MOMETASONE FURO-FORMOTEROL FUM 100-5 MCG/ACT IN AERO
2.0000 | INHALATION_SPRAY | Freq: Two times a day (BID) | RESPIRATORY_TRACT | Status: DC
  Administered 2017-11-27 – 2017-11-30 (×5): 2 via RESPIRATORY_TRACT
  Filled 2017-11-27: qty 8.8

## 2017-11-27 MED ORDER — SODIUM CHLORIDE 0.9 % IV SOLN: 250.0000 mL | INTRAVENOUS | Status: DC | PRN

## 2017-11-27 MED ORDER — PANTOPRAZOLE SODIUM 40 MG PO TBEC
40.0000 mg | DELAYED_RELEASE_TABLET | Freq: Every day | ORAL | Status: DC
  Administered 2017-11-27 – 2017-12-01 (×5): 40 mg via ORAL
  Filled 2017-11-27 (×5): qty 1

## 2017-11-27 MED ORDER — ONDANSETRON HCL 4 MG PO TABS: 4.0000 mg | ORAL_TABLET | Freq: Four times a day (QID) | ORAL | Status: DC | PRN

## 2017-11-27 MED ORDER — CEFTAZIDIME 2 G IV SOLR: 2.0000 g | INTRAVENOUS | Status: DC

## 2017-11-27 MED ORDER — TIOTROPIUM BROMIDE MONOHYDRATE 18 MCG IN CAPS
18.0000 ug | ORAL_CAPSULE | Freq: Every day | RESPIRATORY_TRACT | Status: DC
  Filled 2017-11-27 (×2): qty 5

## 2017-11-27 MED ORDER — SEVELAMER CARBONATE 800 MG PO TABS
1600.0000 mg | ORAL_TABLET | ORAL | Status: DC
  Administered 2017-11-28: 1600 mg via ORAL
  Filled 2017-11-27: qty 2

## 2017-11-27 MED ORDER — SEVELAMER CARBONATE 800 MG PO TABS: 1600.0000 mg | ORAL_TABLET | ORAL | Status: DC

## 2017-11-27 MED ORDER — VANCOMYCIN HCL 10 G IV SOLR
1500.0000 mg | Freq: Once | INTRAVENOUS | Status: AC
  Administered 2017-11-27: 1500 mg via INTRAVENOUS
  Filled 2017-11-27: qty 1500

## 2017-11-27 MED ORDER — KETOROLAC TROMETHAMINE 30 MG/ML IJ SOLN
30.0000 mg | Freq: Four times a day (QID) | INTRAMUSCULAR | Status: DC | PRN
  Administered 2017-11-29: 30 mg via INTRAVENOUS
  Filled 2017-11-27: qty 1

## 2017-11-27 MED ORDER — OXYCODONE HCL 5 MG PO TABS
10.0000 mg | ORAL_TABLET | Freq: Four times a day (QID) | ORAL | Status: DC | PRN
  Administered 2017-11-27 – 2017-12-01 (×7): 10 mg via ORAL
  Filled 2017-11-27 (×7): qty 2

## 2017-11-27 MED ORDER — CLOPIDOGREL BISULFATE 75 MG PO TABS
75.0000 mg | ORAL_TABLET | Freq: Every day | ORAL | Status: DC
  Administered 2017-11-27 – 2017-12-01 (×5): 75 mg via ORAL
  Filled 2017-11-27 (×5): qty 1

## 2017-11-27 MED ORDER — ENOXAPARIN SODIUM 30 MG/0.3ML ~~LOC~~ SOLN
30.0000 mg | SUBCUTANEOUS | Status: DC
Start: 2017-11-27 — End: 2017-12-01
  Administered 2017-11-28: 30 mg via SUBCUTANEOUS
  Filled 2017-11-27 (×2): qty 0.3

## 2017-11-27 MED ORDER — VANCOMYCIN HCL IN DEXTROSE 750-5 MG/150ML-% IV SOLN
750.0000 mg | INTRAVENOUS | Status: DC
  Filled 2017-11-27 (×2): qty 150

## 2017-11-27 MED ORDER — HYDROMORPHONE HCL 2 MG/ML IJ SOLN
1.0000 mg | Freq: Once | INTRAMUSCULAR | Status: AC
Start: 2017-11-27 — End: 2017-11-27
  Administered 2017-11-27: 1 mg via INTRAVENOUS
  Filled 2017-11-27: qty 1

## 2017-11-27 MED ORDER — FAMOTIDINE 20 MG PO TABS
20.0000 mg | ORAL_TABLET | Freq: Every day | ORAL | Status: DC
  Administered 2017-11-27 – 2017-11-30 (×4): 20 mg via ORAL
  Filled 2017-11-27 (×4): qty 1

## 2017-11-27 MED ORDER — SODIUM CHLORIDE 0.9% FLUSH: 3.0000 mL | INTRAVENOUS | Status: DC | PRN

## 2017-11-27 MED ORDER — NICOTINE 14 MG/24HR TD PT24
14.0000 mg | MEDICATED_PATCH | Freq: Every day | TRANSDERMAL | Status: DC
  Administered 2017-11-27 – 2017-11-30 (×4): 14 mg via TRANSDERMAL
  Filled 2017-11-27 (×4): qty 1

## 2017-11-27 MED ORDER — HYDRALAZINE HCL 50 MG PO TABS
100.0000 mg | ORAL_TABLET | Freq: Two times a day (BID) | ORAL | Status: DC | PRN
  Filled 2017-11-27: qty 2

## 2017-11-27 MED ORDER — LACTULOSE 10 GM/15ML PO SOLN
20.0000 g | Freq: Two times a day (BID) | ORAL | Status: DC
Start: 2017-11-27 — End: 2017-12-01
  Administered 2017-11-27: 20 g via ORAL
  Administered 2017-11-27: 10 g via ORAL
  Administered 2017-11-27 – 2017-12-01 (×8): 20 g via ORAL
  Filled 2017-11-27 (×11): qty 30

## 2017-11-27 MED ORDER — GABAPENTIN 100 MG PO CAPS
100.0000 mg | ORAL_CAPSULE | Freq: Two times a day (BID) | ORAL | Status: DC
  Administered 2017-11-27 – 2017-12-01 (×9): 100 mg via ORAL
  Filled 2017-11-27 (×9): qty 1

## 2017-11-27 MED ORDER — ONDANSETRON HCL 4 MG/2ML IJ SOLN
4.0000 mg | Freq: Four times a day (QID) | INTRAMUSCULAR | Status: DC | PRN
  Administered 2017-11-29: 4 mg via INTRAVENOUS
  Filled 2017-11-27: qty 2

## 2017-11-27 MED ORDER — CEFTRIAXONE SODIUM 2 G IJ SOLR
2.0000 g | Freq: Once | INTRAMUSCULAR | Status: AC
  Administered 2017-11-27: 2 g via INTRAVENOUS
  Filled 2017-11-27: qty 20

## 2017-11-27 MED ORDER — SODIUM CHLORIDE 0.9% FLUSH: 3.0000 mL | Freq: Two times a day (BID) | INTRAVENOUS | Status: DC

## 2017-11-27 MED ORDER — ALBUTEROL SULFATE HFA 108 (90 BASE) MCG/ACT IN AERS: 2.0000 | INHALATION_SPRAY | Freq: Four times a day (QID) | RESPIRATORY_TRACT | Status: DC | PRN

## 2017-11-27 MED ORDER — ISOSORBIDE MONONITRATE ER 30 MG PO TB24
30.0000 mg | ORAL_TABLET | Freq: Every day | ORAL | Status: DC
  Administered 2017-11-27 – 2017-12-01 (×4): 30 mg via ORAL
  Filled 2017-11-27 (×4): qty 1

## 2017-11-27 MED ORDER — FLUTICASONE PROPIONATE 50 MCG/ACT NA SUSP
2.0000 | Freq: Every day | NASAL | Status: DC
  Filled 2017-11-27 (×2): qty 16

## 2017-11-27 MED ORDER — ALBUTEROL SULFATE (2.5 MG/3ML) 0.083% IN NEBU: 2.5000 mg | INHALATION_SOLUTION | RESPIRATORY_TRACT | Status: DC | PRN

## 2017-11-27 MED ORDER — POLYETHYLENE GLYCOL 3350 17 G PO PACK: 17.0000 g | PACK | Freq: Every day | ORAL | Status: DC | PRN

## 2017-11-27 MED ORDER — IOHEXOL 300 MG/ML  SOLN
100.0000 mL | Freq: Once | INTRAMUSCULAR | Status: AC | PRN
  Administered 2017-11-27: 100 mL via INTRAVENOUS

## 2017-11-27 MED ORDER — SODIUM CHLORIDE 0.9 % IV SOLN
2.0000 g | INTRAVENOUS | Status: AC
  Administered 2017-11-27: 2 g via INTRAVENOUS
  Filled 2017-11-27: qty 2

## 2017-11-27 MED ORDER — IBUPROFEN 400 MG PO TABS: 400.0000 mg | ORAL_TABLET | Freq: Four times a day (QID) | ORAL | Status: DC | PRN

## 2017-11-27 MED ORDER — LORATADINE 10 MG PO TABS
10.0000 mg | ORAL_TABLET | Freq: Every day | ORAL | Status: DC
  Administered 2017-11-28 – 2017-12-01 (×3): 10 mg via ORAL
  Filled 2017-11-27 (×5): qty 1

## 2017-11-27 MED ORDER — SEVELAMER CARBONATE 800 MG PO TABS
3200.0000 mg | ORAL_TABLET | Freq: Three times a day (TID) | ORAL | Status: DC
  Administered 2017-11-27 – 2017-11-30 (×7): 3200 mg via ORAL
  Filled 2017-11-27 (×12): qty 4

## 2017-11-27 MED ORDER — SODIUM CHLORIDE 0.9 % IV SOLN
INTRAVENOUS | Status: DC
  Administered 2017-11-27: 09:00:00 via INTRAVENOUS

## 2017-11-27 MED ORDER — SODIUM CHLORIDE 0.9 % IV SOLN: 1.0000 g | Freq: Three times a day (TID) | INTRAVENOUS | Status: DC

## 2017-11-27 MED ORDER — HYDROMORPHONE HCL 2 MG/ML IJ SOLN
1.0000 mg | Freq: Once | INTRAMUSCULAR | Status: AC
  Administered 2017-11-27: 1 mg via INTRAVENOUS
  Filled 2017-11-27: qty 1

## 2017-11-27 NOTE — Progress Notes (Signed)
Patient arrived on unit via hospital bed and was oriented to room. Patient is alert and oriented x4. IV saline locked. No telemetry.    De Nurse, RN

## 2017-11-27 NOTE — H&P (Signed)
History and Physical    [name] UXN:[id number] DOB: 05-12-1961 DOA: 11/26/2017  PCP: [name], MD  Patient coming from: Home  I have personally briefly reviewed patient's old medical records in [place]  Chief Complaint: Abdominal pain and distention and right upper arm pain after placement of graft last week  HPI: [name] is a 57 y.o. 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  He was recently hospitalized from 5/7 to 11/19/2017.  Diagnoses at discharge was persistent spontaneous bacterial peritonitis complicated by sepsis, chronic liver failure due to alcoholism, hepatitis B and C with cirrhosis.  Hypertension paroxysmal atrial fibrillation and he did receive dialysis while in the hospital. On May 4 he had left the hospital [place] but presented back to the emergency department on May 7.  With worsening abdominal pain which was constant and severe in intensity.  He was treated for persistent spontaneous bacterial peritonitis having left without completing his course of therapy.  This is his 17th admission to the hospital in 12 months.  Patient does frequently leave [place].  Fortunately this last discharge he did leave having completed his course of treatment and was supposed to continue his antibiotics with hemodialysis.  His reported last dose of cefepime was supposed to be on the 15th.  Interestingly and as expected all body fluid cultures from his abdomen have been negative.  Many of them have been read as many white blood cells.   Patient's temperature in the emergency department is 99 degrees he is tachypneic and tachycardic on arrival but that has improved since treatment in the emergency department.  Additionally patient complains of right upper extremity pain is very tender to palpation and hot to the touch.  It is in the area where his graft was placed last week on May 14.  This area is concerning for cellulitis and possible graft infection versus reaction to the Gore-Tex.   Review of Systems: As per HPI otherwise all other systems reviewed and  negative.   Past Medical History:  Diagnosis Date  . Adenomatous colon polyp    tubular  . Anemia   . Anxiety   . Arthritis    left shoulder  . Atherosclerosis of aorta ([place])   . Cardiomegaly   . Chest pain    DATE UNKNOWN, C/O PERIODICALLY  . Cocaine abuse ([place])   . COPD exacerbation ([place]) 08/17/2016  . Coronary artery disease    stent 02/22/17  . Dialysis patient ([place])    Monday-Wednesday-Friday  . ESRD (end stage renal disease) on dialysis ([place])    "[name]; MWF" (07/04/2017)  . GERD (gastroesophageal reflux disease)    DATE UNKNOWN  . Hemorrhoids   . Hepatitis B, chronic ([place])   . Hepatitis C   . History of kidney stones   . Hyperkalemia   . Hypertension   . Metabolic bone disease    Patient denies  . Mitral stenosis   . Myocardial infarction ([place])   . Pneumonia   . Pulmonary edema   . Renal disorder   . Renal insufficiency   . Shortness of breath dyspnea    " for the  last past year with this dialysis"  . Solitary rectal ulcer syndrome   . Tubular adenoma of colon     Past Surgical History:  Procedure Laterality Date  . A/V FISTULAGRAM Left 05/26/2017   Procedure: A/V FISTULAGRAM;  Surgeon: Conrad [place], MD;  Location: [facility];  Service: Cardiovascular;  Laterality: Left;  . A/V FISTULAGRAM Right 11/18/2017   Procedure: A/V FISTULAGRAM - Right Arm;  Surgeon: Elam Dutch, MD;  Location: [facility];  Service:  Cardiovascular;  Laterality: Right;  . APPLICATION OF WOUND VAC Left 06/14/2017   Procedure: APPLICATION OF WOUND VAC;  Surgeon: Katha Cabal, MD;  Location: [facility];  Service: Vascular;  Laterality: Left;  . AV FISTULA PLACEMENT  2012   BELIEVED WAS PLACED IN JUNE  . AV FISTULA PLACEMENT Right 08/09/2017   Procedure: Creation Right arm ARTERIOVENOUS BRACHIOCEPOHALIC FISTULA;  Surgeon: Elam Dutch, MD;  Location: [facility] OR;  Service: Vascular;  Laterality: Right;  . AV FISTULA PLACEMENT Right 11/22/2017   Procedure: INSERTION OF ARTERIOVENOUS (AV) GORE-TEX GRAFT RIGHT UPPER ARM;  Surgeon: Elam Dutch, MD;  Location: [facility];  Service: Vascular;  Laterality: Right;  . COLONOSCOPY    . CORONARY STENT INTERVENTION N/A 02/22/2017   Procedure: CORONARY STENT INTERVENTION;  Surgeon: Nigel Mormon, MD;  Location: [facility];  Service: Cardiovascular;  Laterality: N/A;  . FLEXIBLE SIGMOIDOSCOPY N/A 07/15/2017   Procedure: FLEXIBLE SIGMOIDOSCOPY;  Surgeon: Carol Ada, MD;  Location: [facility];  Service: Endoscopy;  Laterality: N/A;  . HEMORRHOID BANDING    . I&D EXTREMITY Left 06/01/2017   Procedure: IRRIGATION AND DEBRIDEMENT LEFT ARM HEMATOMA WITH LIGATION OF LEFT ARM AV FISTULA;  Surgeon: Elam Dutch, MD;  Location: [facility];  Service: Vascular;  Laterality: Left;  . I&D EXTREMITY Left 06/14/2017   Procedure: IRRIGATION AND DEBRIDEMENT EXTREMITY;  Surgeon: Katha Cabal, MD;  Location: [facility];  Service: Vascular;  Laterality: Left;  . INSERTION OF DIALYSIS CATHETER  05/30/2017  . INSERTION OF DIALYSIS CATHETER N/A 05/30/2017   Procedure: INSERTION OF DIALYSIS CATHETER;  Surgeon: Elam Dutch, MD;  Location: [facility];  Service: Vascular;  Laterality: N/A;  . IR PARACENTESIS  08/30/2017  . IR PARACENTESIS  09/29/2017  . IR PARACENTESIS  10/28/2017  . IR PARACENTESIS  11/09/2017  . IR PARACENTESIS  11/16/2017  . LEFT HEART CATH AND CORONARY ANGIOGRAPHY N/A 02/22/2017     Procedure: LEFT HEART CATH AND CORONARY ANGIOGRAPHY;  Surgeon: Nigel Mormon, MD;  Location: [facility];  Service: Cardiovascular;  Laterality: N/A;  . LIGATION OF ARTERIOVENOUS  FISTULA Left 10/14/9199   Procedure: Plication of Left Arm Arteriovenous Fistula;  Surgeon: Elam Dutch, MD;  Location: [facility];  Service: Vascular;  Laterality: Left;  . POLYPECTOMY    . REVISON OF ARTERIOVENOUS FISTULA Left 0/01/1218   Procedure: PLICATION OF DISTAL ANEURYSMAL SEGEMENT OF LEFT UPPER ARM ARTERIOVENOUS FISTULA;  Surgeon: Elam Dutch, MD;  Location: [facility];  Service: Vascular;  Laterality: Left;  . REVISON OF ARTERIOVENOUS FISTULA Left 7/58/8325   Procedure: Plication of Left Upper Arm Fistula ;  Surgeon: Waynetta Sandy, MD;  Location: [facility];  Service: Vascular;  Laterality: Left;  . SKIN GRAFT SPLIT THICKNESS LEG / FOOT Left    SKIN GRAFT SPLIT THICKNESS LEFT ARM DONOR SITE: LEFT ANTERIOR THIGH  . SKIN SPLIT GRAFT Left 07/04/2017   Procedure: SKIN GRAFT SPLIT THICKNESS LEFT ARM DONOR SITE:  LEFT ANTERIOR THIGH;  Surgeon: Elam Dutch, MD;  Location: [facility];  Service: Vascular;  Laterality: Left;  . THROMBECTOMY W/ EMBOLECTOMY Left 06/05/2017   Procedure: EXPLORATION OF LEFT ARM FOR BLEEDING; OVERSEWED PROXIMAL FISTULA;  Surgeon: Angelia Mould, MD;  Location: [facility];  Service: Vascular;  Laterality: Left;  . WOUND EXPLORATION Left 06/03/2017   Procedure: WOUND EXPLORATION WITH WOUND VAC APPLICATION TO LEFT ARM;  Surgeon: Angelia Mould, MD;  Location: [facility];  Service: Vascular;  Laterality: Left;    Social History   Social History Narrative   Lives alone   Caffeine use: Coffee-rare   Soda- daily      [facility] (03/10/17):   Originally from [facility]. Previously worked trimming trees. No pets currently. No bird or mold exposure.      reports that he has been smoking cigarettes.  He started smoking about 44 years ago. He has a 21.50 pack-year  smoking history. He has never used smokeless tobacco. He reports that he drank alcohol. He reports that he has current or past drug history.  Allergies  Allergen Reactions  . Aspirin Other (See Comments)    STOMACH PAIN  . Clonidine Derivatives Itching  . Tramadol Itching  . Tylenol [Acetaminophen] Nausea Only    Stomach ache    Family History  Problem Relation Age of Onset  . Heart disease Mother   . Lung cancer Mother   . Heart disease Father   . Malignant hyperthermia Father   . COPD Father   . Throat cancer Sister   . Esophageal cancer Sister   . Hypertension Other   . COPD Other   . Colon cancer Neg Hx   . Colon polyps Neg Hx   . Rectal cancer Neg Hx   . Stomach cancer Neg Hx      Prior to Admission medications   Medication Sig Start Date End Date Taking? Authorizing Provider  albuterol (VENTOLIN HFA) 108 (90 Base) MCG/ACT inhaler Inhale 2 puffs into the lungs every 6 (six) hours as needed for wheezing or shortness of breath.   Yes [provider]  budesonide-formoterol (SYMBICORT) 80-4.5 MCG/ACT inhaler Inhale 2 puffs into the lungs 2 (two) times daily. 10/13/17  Yes Tanda Rockers, MD  Ceftazidime (FORTAZ) 2 g SOLR injection Inject 2 g into the vein every other day. To receive on hemodialysis, last dose 11/23/2017. 11/19/17  Yes Arrien, Jimmy Picket, MD  clopidogrel (PLAVIX) 75 MG tablet Take 1 tablet (75 mg total) by mouth daily. 07/15/17  Yes Rhyne, Hulen Shouts, PA-C  diltiazem (CARDIZEM CD) 120 MG 24 hr capsule Take 1 capsule (120 mg total) by mouth daily. 06/23/17 06/23/18 Yes Shahmehdi, Valeria Batman, MD  famotidine (PEPCID) 20 MG tablet One at bedtime Patient taking differently: Take 20 mg by mouth at bedtime.  09/08/17  Yes Tanda Rockers, MD  fluticasone (FLONASE) 50 MCG/ACT nasal spray Place 2 sprays into both nostrils daily. 10/31/17  Yes Eugenie Filler, MD  gabapentin (NEURONTIN) 100 MG capsule Take 1 capsule (100 mg total) by mouth 2 (two) times daily.  11/22/17  Yes Rhyne, Samantha J, PA-C  hydrALAZINE (APRESOLINE) 100 MG tablet Take 1 tablet by mouth twice a day take after HD on dialysis days 08/31/17  Yes [provider]  isosorbide mononitrate (IMDUR) 30 MG 24 hr tablet Take 1 tablet (30 mg total) by mouth daily. Patient taking differently: Take 30 mg by mouth daily. ER 06/17/17  Yes [name], MD  lactulose (  CHRONULAC) 10 GM/15ML solution Take 30 mLs (20 g total) by mouth 2 (two) times daily. 11/19/17 12/19/17 Yes Arrien, Jimmy Picket, MD  loratadine (CLARITIN) 10 MG tablet Take 1 tablet (10 mg total) by mouth daily. 10/31/17  Yes Eugenie Filler, MD  Nutritional Supplements (FEEDING SUPPLEMENT, NEPRO CARB STEADY,) LIQD Take 237 mLs by mouth 2 (two) times daily between meals. 09/30/17  Yes Mariel Aloe, MD  Oxycodone HCl 10 MG TABS Take 1 tablet (10 mg total) by mouth every 6 (six) hours as needed. 11/22/17  Yes Rhyne, Samantha J, PA-C  pantoprazole (PROTONIX) 40 MG tablet Take 1 tablet (40 mg total) by mouth daily. Take 30-60 min before first meal of the day 09/08/17  Yes Tanda Rockers, MD  sevelamer carbonate (RENVELA) 800 MG tablet Take 1,600-3,200 mg by mouth See admin instructions. Take 4 tablets (800mg  each) three times daily and 2 tablets twice daily with a snack. 10/31/17  Yes [provider]  tiotropium (SPIRIVA) 18 MCG inhalation capsule Place 18 mcg into inhaler and inhale daily.   Yes [provider]    Physical Exam:  Constitutional: Ill-appearing uncomfortable thin African-American male in mild respiratory discomfort Vitals:   11/27/17 0700 11/27/17 0800 11/27/17 0828 11/27/17 0912  BP: 132/79 (!) 150/83  (!) 152/79  Pulse: (!) 106 80  83  Resp: 20   20  Temp:      TempSrc:      SpO2: 96% 97% 97% 94%   Eyes: PERRL, lids and conjunctivae normal ENMT: Mucous membranes are moist. Posterior pharynx clear of any exudate or lesions.Normal dentition.  Neck: normal, supple, no masses, no  thyromegaly Respiratory: Coarse breath sounds bilaterally, no wheezing, no crackles.  Mildly increased respiratory effort. No accessory muscle use.  Cardiovascular: Tachycardic regular rate and rhythm, no murmurs / rubs / gallops. No extremity edema. 2+ pedal pulses. No carotid bruits.  Abdomen: Firm, tender to palpation diffusely distended no hepatosplenomegaly Musculoskeletal: no clubbing / cyanosis. No joint deformity upper and lower extremities. Good ROM, no contractures. Normal muscle tone.  Skin: no rashes, lesions, ulcers.  Pain and induration of the right upper extremity at the AV graft site.  There is erythema and edema as well Neurologic: CN 2-12 grossly intact. Sensation intact, DTR normal. Strength 5/5 in all 4.  Psychiatric: Normal judgment and insight. Alert and oriented x 3. Normal mood.     Labs on Admission: I have personally reviewed following labs and imaging studies  CBC: Recent Labs  Lab 11/21/17 1954 11/26/17 2133  WBC 11.4* 12.3*  HGB 8.1* 7.9*  HCT 24.6* 24.2*  MCV 73.9* 73.1*  PLT 157 570   Basic Metabolic Panel: Recent Labs  Lab 11/21/17 1954 11/26/17 2133  NA 131* 132*  K 3.8 4.7  CL 91* 89*  CO2 28 28  GLUCOSE 154* 130*  BUN 18 26*  CREATININE 4.93* 6.73*  CALCIUM 8.1* 8.3*   GFR: Estimated Creatinine Clearance: 12 mL/min (A) (by C-G formula based on SCr of 6.73 mg/dL (H)). Liver Function Tests: Recent Labs  Lab 11/21/17 1954 11/26/17 2133  AST 18 27  ALT 11* 9*  ALKPHOS 165* 213*  BILITOT 0.7 0.7  PROT 6.1* 6.5  ALBUMIN 2.5* 2.6*   Recent Labs  Lab 11/26/17 2133  LIPASE 24   Recent Labs  Lab 11/27/17 0225  AMMONIA 23   Coagulation Profile: Recent Labs  Lab 11/21/17 1954 11/27/17 0225  INR 1.26 1.30   CBG: Recent Labs  Lab 11/26/17  2344  GLUCAP 106*   Urine analysis:    Component Value Date/Time   COLORURINE YELLOW 07/16/2011 1619   APPEARANCEUR CLEAR 07/16/2011 1619   LABSPEC 1.018 07/16/2011 1619   PHURINE  7.5 07/16/2011 1619   GLUCOSEU 250 (A) 07/16/2011 1619   HGBUR MODERATE (A) 07/16/2011 1619   BILIRUBINUR SMALL (A) 07/16/2011 1619   KETONESUR NEGATIVE 07/16/2011 1619   PROTEINUR >300 (A) 07/16/2011 1619   UROBILINOGEN 1.0 07/16/2011 1619   NITRITE NEGATIVE 07/16/2011 1619   LEUKOCYTESUR SMALL (A) 07/16/2011 1619    Radiological Exams on Admission: Ct Abdomen Pelvis W Contrast  Result Date: 11/27/2017 CLINICAL DATA:  Acute onset of generalized abdominal pain and distention. EXAM: CT ABDOMEN AND PELVIS WITH CONTRAST TECHNIQUE: Multidetector CT imaging of the abdomen and pelvis was performed using the standard protocol following bolus administration of intravenous contrast. CONTRAST:  132mL OMNIPAQUE IOHEXOL 300 MG/ML  SOLN COMPARISON:  CT of the abdomen and pelvis performed 11/16/2017 FINDINGS: Lower chest: Mild bibasilar atelectasis or scarring is noted. Scattered coronary artery calcifications are seen. Calcification is noted at the mitral valve. Hepatobiliary: The nodular contour of the liver is compatible with hepatic cirrhosis. The gallbladder is difficult to fully assess given surrounding ascites. The common bile duct remains normal in caliber. Pancreas: The pancreas is within normal limits. Spleen: The spleen is unremarkable in appearance. Adrenals/Urinary Tract: The adrenal glands are unremarkable in appearance. Severe chronic bilateral renal atrophy is noted, with scattered bilateral renal cysts and diffuse bilateral vascular calcification. There is no evidence of hydronephrosis. No obstructing ureteral stones are seen. Stomach/Bowel: The stomach is unremarkable in appearance. The small bowel is within normal limits. The appendix is normal in caliber, without evidence of appendicitis. The colon is unremarkable in appearance. Vascular/Lymphatic: Diffuse calcification is seen along the abdominal aorta and its branches. The abdominal aorta is otherwise grossly unremarkable. The inferior vena  cava is grossly unremarkable. No retroperitoneal lymphadenopathy is seen. No pelvic sidewall lymphadenopathy is identified. Reproductive: The bladder is decompressed and not well characterized. The prostate is normal in size. Other: Large volume ascites is noted within the abdomen and pelvis. There is soft tissue density layering dependently within the pelvis. This is of uncertain significance, given that fluid cultures and pathology have been negative. Musculoskeletal: No acute osseous abnormalities are identified. The visualized musculature is unremarkable in appearance. IMPRESSION: 1. Large volume ascites noted within the abdomen and pelvis, with soft tissue density again noted layering dependently in the pelvis. This is of uncertain significance, given that peritoneal fluid cultures and pathology have been negative. 2. Findings of hepatic cirrhosis. 3. Severe chronic bilateral renal atrophy, with scattered bilateral renal cysts. 4. Scattered coronary artery calcifications seen. 5. Mild bibasilar atelectasis or scarring noted. Aortic Atherosclerosis (ICD10-I70.0). Electronically Signed   By: Garald Balding M.D.   On: 11/27/2017 04:17    CT scan of abdomen pelvis personally reviewed by me and shows large amount of ascites.  Assessment/Plan Principal Problem:   SBP (spontaneous bacterial peritonitis) ([place]) Active Problems:   Problem with dialysis access [facility])   CKD (chronic kidney disease) stage V requiring chronic dialysis ([place])   Infection of AV graft for dialysis ([place])   Chronic hepatitis B ([place])   Chronic hepatitis C without hepatic coma ([facility])   COPD (chronic obstructive pulmonary disease) ([facility])   Anemia due to chronic kidney disease   Malnutrition of moderate degree   Liver cirrhosis ([place])   Alcohol abuse   Cigarette smoker   Thrombocytopenia ([place])  Essential hypertension   Anxiety   Mitral stenosis  1.  Spontaneous bacterial peritonitis: This is a working diagnosis.  Patient has  other sources of possible increased white blood cell count however I am extremely concerned about the condition of his abdomen.  He will be started on IV antibiotics today and will likely convert him to be given during dialysis.  Paracentesis has been ordered but cannot be performed until tomorrow.  The biotics will be administered today.  2.  Problem with dialysis access: Patient is having pain swelling erythema in the area of the dialysis access which was placed on May 14.  This is suspicious for infection versus reaction to Gore-Tex.  We have given him vancomycin we will continue to do that.  I have consulted pharmacy.  He will likely be able to get it during hemodialysis.  He has received a dose of vancomycin here in the emergency department and will ask pharmacy to dose it accordingly.  He may be able to get it after dialysis.  3.  Chronic kidney disease stage V requiring chronic dialysis: I have consulted Dr. Burnett Sheng from nephrology.  4.  Infection of the AV graft for dialysis noted as above.  5.  Chronic hep B infection: Noted.  6.  Chronic hepatitis C without coma.  Noted.  7.  Anemia due to chronic kidney disease: Noted likely gets EPO during dialysis.  8.  Malnutrition of moderate degree: Noted will monitor weights if necessary consider nutrition consult.  9.  Liver cirrhosis: Noted patient with severe ascites.  Has had recent spontaneous bacterial peritonitis we have never grown an organism from the cultures that I can see back in 2 February.  However he has had multiple Gram stains that show many white blood cells.  Patient might benefit from a palliative care consultation.  10.  Alcohol abuse: Noted states he has not been drinking recently.  Will monitor closely consider [place] protocol.  11.  Cigarette smoker: Noted nicotine patch if patient is interested.  12.  Thrombocytopenia: Likely related to liver disease.  13 essential hypertension: Noted.  14 anxiety: Continue home  medications.  15.  Mitral stenosis: Noted and we need to watch out for possible endocarditis should the patient become bacteremic.  DVT prophylaxis: Lovenox Code Status: Full code Family Communication: She retains capacity no family present Disposition Plan: Likely home in 4 to 7 days Consults called: Dr. Donnetta Hutching from vascular, Dr. Burnett Sheng from nephrology Admission status: Inpatient   [name] MD [facility] Pager [phone]  If 7PM-7AM, please contact night-coverage [email or web address] Password [facility]  11/27/2017, 9:42 AM

## 2017-11-27 NOTE — Progress Notes (Signed)
Pt will not wear arm band. Pt states it irritates his skin. Pt took it off.   Eleanora Neighbor, RN

## 2017-11-27 NOTE — ED Notes (Signed)
Pt requested additional pain medication. MD aware

## 2017-11-27 NOTE — ED Notes (Signed)
IR called to say pt would not be able to have US paracentesis until tomorrow

## 2017-11-27 NOTE — Consult Note (Addendum)
[facility] Renal Consultation Note    Indication for Consultation:  Management of ESRD/hemodialysis; anemia, hypertension/volume and secondary hyperparathyroidism PCP: [name], MD  HPI: [name] is a 57 y.o. male with ESRD on MWF HD with PMHx significant for Hep B/C, alcoholic cirrhosis, COPD, GERD, polysubstance abuse, medical noncompliance and recent admissions for SBP following paracentesis. Per admitting H and P this is his 17th admission in 12 months.  He returns again with abdoinal pain and distention and arm pain after access placement 5/14. He is once again started on Vanc and Fortaz for suspected bacterial peritonitis. I did happen to see his access after [place] placement last Wed while rounding at the dialysis unit.  It was not inordinately swollen with mild but patient did complain of significant pain.  Work up in the ED this am showed INR 1.3 (not on coumadin, BC were drawn, CT abdominal/pelvis show large volume ascites. NH3 23.  Labs 5/18 showed K 4.7 hgb 7.9. He is afebrile (tmax 99)  with ^ BP and mild tachycardia at times.  WBC 12.3 which is similar to prior admission.  He is due for dialysis. He reports chronic diarrhea related to chronic lactulose use. Chronic SOB related to ascites pressing on lungs.  Appetite poor.   Past Medical History:  Diagnosis Date  . Adenomatous colon polyp    tubular  . Anemia   . Anxiety   . Arthritis    left shoulder  . Atherosclerosis of aorta ([place])   . Cardiomegaly   . Chest pain    DATE UNKNOWN, C/O PERIODICALLY  . Cocaine abuse ([place])   . COPD exacerbation ([place]) 08/17/2016  . Coronary artery disease    stent 02/22/17  . Dialysis patient ([place])    Monday-Wednesday-Friday  . ESRD (end stage renal disease) on dialysis ([place])    "[name]; MWF" (07/04/2017)  . GERD (gastroesophageal reflux disease)    DATE UNKNOWN  . Hemorrhoids   . Hepatitis B, chronic ([place])   . Hepatitis C   . History of kidney  stones   . Hyperkalemia   . Hypertension   . Metabolic bone disease    Patient denies  . Mitral stenosis   . Myocardial infarction ([place])   . Pneumonia   . Pulmonary edema   . Renal disorder   . Renal insufficiency   . Shortness of breath dyspnea    " for the last past year with this dialysis"  . Solitary rectal ulcer syndrome   . Tubular adenoma of colon    Past Surgical History:  Procedure Laterality Date  . A/V FISTULAGRAM Left 05/26/2017   Procedure: A/V FISTULAGRAM;  Surgeon: Conrad [place], MD;  Location: [place] CV LAB;  Service: Cardiovascular;  Laterality: Left;  . A/V FISTULAGRAM Right 11/18/2017   Procedure: A/V FISTULAGRAM - Right Arm;  Surgeon: Elam Dutch, MD;  Location: [place] CV LAB;  Service: Cardiovascular;  Laterality: Right;  . APPLICATION OF WOUND VAC Left 06/14/2017   Procedure: APPLICATION OF WOUND VAC;  Surgeon: Katha Cabal, MD;  Location: [facility] ORS;  Service: Vascular;  Laterality: Left;  . AV FISTULA PLACEMENT  2012   BELIEVED WAS PLACED IN JUNE  . AV FISTULA PLACEMENT Right 08/09/2017   Procedure: Creation Right arm ARTERIOVENOUS BRACHIOCEPOHALIC FISTULA;  Surgeon: Elam Dutch, MD;  Location: [facility] OR;  Service: Vascular;  Laterality: Right;  . AV FISTULA PLACEMENT Right 11/22/2017   Procedure: INSERTION OF ARTERIOVENOUS (AV) GORE-TEX GRAFT RIGHT  UPPER ARM;  Surgeon: Elam Dutch, MD;  Location: [place];  Service: Vascular;  Laterality: Right;  . COLONOSCOPY    . CORONARY STENT INTERVENTION N/A 02/22/2017   Procedure: CORONARY STENT INTERVENTION;  Surgeon: Nigel Mormon, MD;  Location: [place] CV LAB;  Service: Cardiovascular;  Laterality: N/A;  . FLEXIBLE SIGMOIDOSCOPY N/A 07/15/2017   Procedure: FLEXIBLE SIGMOIDOSCOPY;  Surgeon: Carol Ada, MD;  Location: [place];  Service: Endoscopy;  Laterality: N/A;  . HEMORRHOID BANDING    . I&D EXTREMITY Left 06/01/2017   Procedure: IRRIGATION AND DEBRIDEMENT LEFT ARM HEMATOMA  WITH LIGATION OF LEFT ARM AV FISTULA;  Surgeon: Elam Dutch, MD;  Location: [place];  Service: Vascular;  Laterality: Left;  . I&D EXTREMITY Left 06/14/2017   Procedure: IRRIGATION AND DEBRIDEMENT EXTREMITY;  Surgeon: Katha Cabal, MD;  Location: [facility] ORS;  Service: Vascular;  Laterality: Left;  . INSERTION OF DIALYSIS CATHETER  05/30/2017  . INSERTION OF DIALYSIS CATHETER N/A 05/30/2017   Procedure: INSERTION OF DIALYSIS CATHETER;  Surgeon: Elam Dutch, MD;  Location: [place];  Service: Vascular;  Laterality: N/A;  . IR PARACENTESIS  08/30/2017  . IR PARACENTESIS  09/29/2017  . IR PARACENTESIS  10/28/2017  . IR PARACENTESIS  11/09/2017  . IR PARACENTESIS  11/16/2017  . LEFT HEART CATH AND CORONARY ANGIOGRAPHY N/A 02/22/2017   Procedure: LEFT HEART CATH AND CORONARY ANGIOGRAPHY;  Surgeon: Nigel Mormon, MD;  Location: [place] CV LAB;  Service: Cardiovascular;  Laterality: N/A;  . LIGATION OF ARTERIOVENOUS  FISTULA Left 03/14/8181   Procedure: Plication of Left Arm Arteriovenous Fistula;  Surgeon: Elam Dutch, MD;  Location: [place];  Service: Vascular;  Laterality: Left;  . POLYPECTOMY    . REVISON OF ARTERIOVENOUS FISTULA Left 9/93/7169   Procedure: PLICATION OF DISTAL ANEURYSMAL SEGEMENT OF LEFT UPPER ARM ARTERIOVENOUS FISTULA;  Surgeon: Elam Dutch, MD;  Location: [place];  Service: Vascular;  Laterality: Left;  . REVISON OF ARTERIOVENOUS FISTULA Left 6/78/9381   Procedure: Plication of Left Upper Arm Fistula ;  Surgeon: Waynetta Sandy, MD;  Location: [place];  Service: Vascular;  Laterality: Left;  . SKIN GRAFT SPLIT THICKNESS LEG / FOOT Left    SKIN GRAFT SPLIT THICKNESS LEFT ARM DONOR SITE: LEFT ANTERIOR THIGH  . SKIN SPLIT GRAFT Left 07/04/2017   Procedure: SKIN GRAFT SPLIT THICKNESS LEFT ARM DONOR SITE: LEFT ANTERIOR THIGH;  Surgeon: Elam Dutch, MD;  Location: [place];  Service: Vascular;  Laterality: Left;  . THROMBECTOMY W/ EMBOLECTOMY Left  06/05/2017   Procedure: EXPLORATION OF LEFT ARM FOR BLEEDING; OVERSEWED PROXIMAL FISTULA;  Surgeon: Angelia Mould, MD;  Location: [place];  Service: Vascular;  Laterality: Left;  . WOUND EXPLORATION Left 06/03/2017   Procedure: WOUND EXPLORATION WITH WOUND VAC APPLICATION TO LEFT ARM;  Surgeon: Angelia Mould, MD;  Location: [facility] OR;  Service: Vascular;  Laterality: Left;   Family History  Problem Relation Age of Onset  . Heart disease Mother   . Lung cancer Mother   . Heart disease Father   . Malignant hyperthermia Father   . COPD Father   . Throat cancer Sister   . Esophageal cancer Sister   . Hypertension Other   . COPD Other   . Colon cancer Neg Hx   . Colon polyps Neg Hx   . Rectal cancer Neg Hx   . Stomach cancer Neg Hx    Social History:  reports that he has  been smoking cigarettes.  He started smoking about 44 years ago. He has a 21.50 pack-year smoking history. He has never used smokeless tobacco. He reports that he drank alcohol. He reports that he has current or past drug history. Allergies  Allergen Reactions  . Aspirin Other (See Comments)    STOMACH PAIN  . Clonidine Derivatives Itching  . Tramadol Itching  . Tylenol [Acetaminophen] Nausea Only    Stomach ache   Prior to Admission medications   Medication Sig Start Date End Date Taking? Authorizing Provider  albuterol (VENTOLIN HFA) 108 (90 Base) MCG/ACT inhaler Inhale 2 puffs into the lungs every 6 (six) hours as needed for wheezing or shortness of breath.   Yes [provider]  budesonide-formoterol (SYMBICORT) 80-4.5 MCG/ACT inhaler Inhale 2 puffs into the lungs 2 (two) times daily. 10/13/17  Yes Tanda Rockers, MD  Ceftazidime (FORTAZ) 2 g SOLR injection Inject 2 g into the vein every other day. To receive on hemodialysis, last dose 11/23/2017. 11/19/17  Yes Arrien, Jimmy Picket, MD  clopidogrel (PLAVIX) 75 MG tablet Take 1 tablet (75 mg total) by mouth daily. 07/15/17  Yes Rhyne, Hulen Shouts, PA-C  diltiazem (CARDIZEM CD) 120 MG 24 hr capsule Take 1 capsule (120 mg total) by mouth daily. 06/23/17 06/23/18 Yes Shahmehdi, Valeria Batman, MD  famotidine (PEPCID) 20 MG tablet One at bedtime Patient taking differently: Take 20 mg by mouth at bedtime.  09/08/17  Yes Tanda Rockers, MD  fluticasone (FLONASE) 50 MCG/ACT nasal spray Place 2 sprays into both nostrils daily. 10/31/17  Yes Eugenie Filler, MD  gabapentin (NEURONTIN) 100 MG capsule Take 1 capsule (100 mg total) by mouth 2 (two) times daily. 11/22/17  Yes Rhyne, Samantha J, PA-C  hydrALAZINE (APRESOLINE) 100 MG tablet Take 1 tablet by mouth twice a day take after HD on dialysis days 08/31/17  Yes [provider]  isosorbide mononitrate (IMDUR) 30 MG 24 hr tablet Take 1 tablet (30 mg total) by mouth daily. Patient taking differently: Take 30 mg by mouth daily. ER 06/17/17  Yes [name], MD  lactulose [facility]) 10 GM/15ML solution Take 30 mLs (20 g total) by mouth 2 (two) times daily. 11/19/17 12/19/17 Yes Arrien, Jimmy Picket, MD  loratadine (CLARITIN) 10 MG tablet Take 1 tablet (10 mg total) by mouth daily. 10/31/17  Yes Eugenie Filler, MD  Nutritional Supplements (FEEDING SUPPLEMENT, NEPRO CARB STEADY,) LIQD Take 237 mLs by mouth 2 (two) times daily between meals. 09/30/17  Yes Mariel Aloe, MD  Oxycodone HCl 10 MG TABS Take 1 tablet (10 mg total) by mouth every 6 (six) hours as needed. 11/22/17  Yes Rhyne, Samantha J, PA-C  pantoprazole (PROTONIX) 40 MG tablet Take 1 tablet (40 mg total) by mouth daily. Take 30-60 min before first meal of the day 09/08/17  Yes Tanda Rockers, MD  sevelamer carbonate (RENVELA) 800 MG tablet Take 1,600-3,200 mg by mouth See admin instructions. Take 4 tablets (800mg  each) three times daily and 2 tablets twice daily with a snack. 10/31/17  Yes [provider]  tiotropium (SPIRIVA) 18 MCG inhalation capsule Place 18 mcg into inhaler and inhale daily.   Yes [provider]    Current Facility-Administered Medications  Medication Dose Route Frequency Provider Last Rate Last Dose  . 0.9 %  sodium chloride infusion   Intravenous Continuous [name], MD 50 mL/hr at 11/27/17 0859    . 0.9 %  sodium chloride infusion  250 mL Intravenous  PRN [name], MD      . albuterol (PROVENTIL) (2.5 MG/3ML) 0.083% nebulizer solution 2.5 mg  2.5 mg Nebulization Q2H PRN [name], MD      . Derrill Memo ON 11/28/2017] cefTAZidime (FORTAZ) 2 g in sodium chloride 0.9 % 100 mL IVPB  2 g Intravenous Q M,W,F-HD Dang, Thuy D, RPH      . clopidogrel (PLAVIX) tablet 75 mg  75 mg Oral Daily [name], MD   75 mg at 11/27/17 0935  . diltiazem (CARDIZEM CD) 24 hr capsule 120 mg  120 mg Oral Daily [name], MD      . enoxaparin (LOVENOX) injection 30 mg  30 mg Subcutaneous Q24H [name], MD      . famotidine (PEPCID) tablet 20 mg  20 mg Oral QHS [name], MD      . feeding supplement (NEPRO CARB STEADY) liquid 237 mL  237 mL Oral BID BM [name], MD      . fluticasone (FLONASE) 50 MCG/ACT nasal spray 2 spray  2 spray Each Nare Daily [name], MD      . gabapentin (NEURONTIN) capsule 100 mg  100 mg Oral BID [name], MD   100 mg at 11/27/17 2774  . hydrALAZINE (APRESOLINE) tablet 100 mg  100 mg Oral BID PRN [name], MD      . ibuprofen (ADVIL,MOTRIN) tablet 400 mg  400 mg Oral Q6H PRN [name], MD      . isosorbide mononitrate (IMDUR) 24 hr tablet 30 mg  30 mg Oral Daily [name], MD   30 mg at 11/27/17 0935  . ketorolac (TORADOL) 30 MG/ML injection 30 mg  30 mg Intravenous Q6H PRN [name], MD      . lactulose (CHRONULAC) 10 GM/15ML solution 20 g  20 g Oral BID [name], MD      . loratadine (CLARITIN) tablet 10 mg  10 mg Oral Daily [name], MD      . mometasone-formoterol [facility]) 100-5 MCG/ACT inhaler 2 puff  2 puff Inhalation BID [name], MD   Stopped at 11/27/17 0915  . ondansetron (ZOFRAN) tablet 4 mg  4 mg Oral Q6H PRN [name], MD       Or  . ondansetron [facility]) injection 4 mg  4 mg Intravenous Q6H PRN [name], MD      . oxyCODONE (Oxy IR/ROXICODONE) immediate release tablet 10 mg  10 mg Oral Q6H PRN [name], MD      . pantoprazole (PROTONIX) EC tablet 40 mg  40 mg Oral Daily [name], MD   40 mg at 11/27/17 0936  . polyethylene glycol (MIRALAX / GLYCOLAX) packet 17 g  17 g Oral Daily PRN [name], MD      . sevelamer carbonate (RENVELA) tablet 1,600 mg  1,600 mg Oral With snacks [name], MD      . sevelamer carbonate (RENVELA) tablet 3,200 mg  3,200 mg Oral TID WC [name], MD      . sodium chloride flush (NS) 0.9 % injection 3 mL  3 mL Intravenous Q12H [name], MD      . sodium chloride flush (NS) 0.9 % injection 3 mL  3 mL Intravenous PRN [name], MD      . tiotropium [facility]) inhalation capsule 18 mcg  18 mcg  Inhalation Daily [name], MD   Stopped at 11/27/17 0915  . [START ON 11/28/2017] vancomycin (VANCOCIN) IVPB 750 mg/150 ml premix  750 mg Intravenous Q M,W,F-HD Tyrone Apple, [facility]       Labs: Basic Metabolic Panel: Recent Labs  Lab 11/21/17 1954 11/26/17 2133  NA 131* 132*  K 3.8 4.7  CL 91* 89*  CO2 28 28  GLUCOSE 154* 130*  BUN 18 26*  CREATININE 4.93* 6.73*  CALCIUM 8.1* 8.3*   Liver Function Tests: Recent Labs  Lab 11/21/17 1954 11/26/17 2133  AST 18 27  ALT 11* 9*  ALKPHOS 165* 213*  BILITOT 0.7 0.7  PROT 6.1* 6.5  ALBUMIN 2.5* 2.6*   Recent Labs  Lab 11/26/17 2133  LIPASE 24   Recent Labs  Lab 11/27/17 0225  AMMONIA 23   CBC: Recent Labs  Lab 11/21/17 1954 11/26/17 2133  WBC 11.4* 12.3*  HGB 8.1* 7.9*  HCT 24.6* 24.2*  MCV 73.9* 73.1*  PLT 157 204   Cardiac Enzymes: No results for input(s): CKTOTAL, CKMB, CKMBINDEX, TROPONINI in the last 168  hours. CBG: Recent Labs  Lab 11/26/17 2344  GLUCAP 106*   Iron Studies: No results for input(s): IRON, TIBC, TRANSFERRIN, FERRITIN in the last 72 hours. Studies/Results: Ct Abdomen Pelvis W Contrast  Result Date: 11/27/2017 CLINICAL DATA:  Acute onset of generalized abdominal pain and distention. EXAM: CT ABDOMEN AND PELVIS WITH CONTRAST TECHNIQUE: Multidetector CT imaging of the abdomen and pelvis was performed using the standard protocol following bolus administration of intravenous contrast. CONTRAST:  175mL OMNIPAQUE IOHEXOL 300 MG/ML  SOLN COMPARISON:  CT of the abdomen and pelvis performed 11/16/2017 FINDINGS: Lower chest: Mild bibasilar atelectasis or scarring is noted. Scattered coronary artery calcifications are seen. Calcification is noted at the mitral valve. Hepatobiliary: The nodular contour of the liver is compatible with hepatic cirrhosis. The gallbladder is difficult to fully assess given surrounding ascites. The common bile duct remains normal in caliber. Pancreas: The pancreas is within normal limits. Spleen: The spleen is unremarkable in appearance. Adrenals/Urinary Tract: The adrenal glands are unremarkable in appearance. Severe chronic bilateral renal atrophy is noted, with scattered bilateral renal cysts and diffuse bilateral vascular calcification. There is no evidence of hydronephrosis. No obstructing ureteral stones are seen. Stomach/Bowel: The stomach is unremarkable in appearance. The small bowel is within normal limits. The appendix is normal in caliber, without evidence of appendicitis. The colon is unremarkable in appearance. Vascular/Lymphatic: Diffuse calcification is seen along the abdominal aorta and its branches. The abdominal aorta is otherwise grossly unremarkable. The inferior vena cava is grossly unremarkable. No retroperitoneal lymphadenopathy is seen. No pelvic sidewall lymphadenopathy is identified. Reproductive: The bladder is decompressed and not well  characterized. The prostate is normal in size. Other: Large volume ascites is noted within the abdomen and pelvis. There is soft tissue density layering dependently within the pelvis. This is of uncertain significance, given that fluid cultures and pathology have been negative. Musculoskeletal: No acute osseous abnormalities are identified. The visualized musculature is unremarkable in appearance. IMPRESSION: 1. Large volume ascites noted within the abdomen and pelvis, with soft tissue density again noted layering dependently in the pelvis. This is of uncertain significance, given that peritoneal fluid cultures and pathology have been negative. 2. Findings of hepatic cirrhosis. 3. Severe chronic bilateral renal atrophy, with scattered bilateral renal cysts. 4. Scattered coronary artery calcifications seen. 5. Mild bibasilar atelectasis or scarring noted. Aortic Atherosclerosis (ICD10-I70.0). Electronically Signed   By: Jacqulynn Cadet  Chang M.D.   On: 11/27/2017 04:17    ROS: As per HPI otherwise negative.  Physical Exam: Vitals:   11/27/17 0828 11/27/17 0912 11/27/17 1047 11/27/17 1222  BP:  (!) 152/79 (!) 158/95 (!) 152/98  Pulse:  83 (!) 107 (!) 110  Resp:  20  18  Temp:    99 F (37.2 C)  TempSrc:    Oral  SpO2: 97% 94% 95% 98%  Weight:    69.2 kg (152 lb 8.9 oz)     General: thin chronically ill male,  sats ok on room air chewing on crushed ice  Head: NCAT sclera not icteric MMM,  Neck: Supple.  Lungs: bilateral scattered wheezes, some coarse BS Heart: tachy reg Abdomen: distended + ascites, diffusely tender M-S:  Muscle wasting Lower extremities:without overt edema or ischemic changes, no open wounds  Neuro: A & O  X 3. Moves all extremities spontaneously. Psych:  Responds to questions appropriately with usual, at times irritable affect, talkative Dialysis Access: right IJ TDC and right upper AVGG placed 5/14 mild erythema/warmth/tender+ bruit  Dialysis Orders: [place] M[facility] 4 hr 400/800 EDW  69 2K 2 Ca TDC and right upper AVGG no heparin parsabiv 5 mircera 225 calcitriol 0.5  (NOTE - these orders are from prior admission - ECube down due to upgrades) Isolation HD due to Hep B +  Assessment/Plan: 1. SBP - ongoing symptoms - ^ WBC - 2075 5/8 - had been on Fortaz at outpatient HD - not sure of last dose- no + cultures so far- can verify Monday when [facility] computer system is available.  For paracentesis Monday but will get abtx today. 2. ESRD -  MWF - HD Monday - needs isolation due to Hep B status - check EC[facility] orders Monday for any changes to prior orders K 4.7  3. Hypertension/volume  - BP up - needs some volume off but difficult to tell true EDW due to ascites -  4. Anemia  - hgb low  7.9 - need to compare with outpt labs/determine ESA redose schedule 5. Metabolic bone disease -  Continue calcitriol - verify dose 6. Nutrition -renal diet 7. Hx cirrhosis/chronic ascites/chronic lactulose/Hep B and C 8. Hx polusubstance abuse 9. Hx medical noncompliance - 10. New right upper AVGG with erythema, tenderness - appears more like gortex reaction than infection - seen by VVS  11. Poor social support  Myriam Jacobson, [place] [phone] 11/27/2017, 12:56 PM   Pt seen, examined and agree w A/P as above.   [name] MD [employer] pager [phone]   11/27/2017, 4:19 PM

## 2017-11-27 NOTE — ED Notes (Signed)
Pt. Stated he was unable to pee when asked to provide a sample

## 2017-11-27 NOTE — Progress Notes (Signed)
Pharmacy Antibiotic Note  [name] is a 57 y.o. male admitted on 11/26/2017 with abdominal pain.  Pharmacy has been consulted for vancomycin dosing for bacterial peritonitis.  [name] is also ordered.  Noted he already received the loading dose of vancomycin and one dose of Rocephin.  Patient has ESRD on MWF HD.  AVG graft recently placed on 11/22/17.  Afebrile, WBC 12.3.   Plan: Vanc 750mg  IV qHD MWF Change Fortaz to 2gm IV x 1, then qHD MWF Monitor HD schedule/tolerance, clinical progress, vanc level as indicated    Temp (24hrs), Avg:99.1 F (37.3 C), Min:99.1 F (37.3 C), Max:99.1 F (37.3 C)  Recent Labs  Lab 11/21/17 1954 11/26/17 2133 11/26/17 2140 11/26/17 2327  WBC 11.4* 12.3*  --   --   CREATININE 4.93* 6.73*  --   --   LATICACIDVEN  --   --  1.56 1.01    Estimated Creatinine Clearance: 12 mL/min (A) (by C-G formula based on SCr of 6.73 mg/dL (H)).    Allergies  Allergen Reactions  . Aspirin Other (See Comments)    STOMACH PAIN  . Clonidine Derivatives Itching  . Tramadol Itching  . Tylenol [Acetaminophen] Nausea Only    Stomach ache     Vanc 5/19 >> [name] 5/19 >>  5/19 UCx -  5/19  BCx -   Vedh Ptacek D. Mina Marble, PharmD, BCPS, BCCCP Pager:  [phone] 11/27/2017, 10:19 AM

## 2017-11-27 NOTE — Progress Notes (Signed)
Patient ID: [name], male   DOB: 04/06/1961, 57 y.o.   MRN: [id number] Asked to see regarding right arm.  Has had a long complicated history of access for hemodialysis.  Had a new right upper arm AV graft placed by Dr. Oneida Alar on 11/22/2017.  Presented with concern regarding pain and some swelling.  On physical exam he does have a thrill in the graft.  He does have the expected amount of erythema after placement of the prosthetic Gore-Tex graft in his arm.  No significant swelling.  Suspect that this is all related to reaction to the Gore-Tex graft which is quite common.  Cannot rule out infection but suspect that this is unlikely.  Hospitalist.  Will follow with you

## 2017-11-28 ENCOUNTER — Inpatient Hospital Stay (HOSPITAL_COMMUNITY): Payer: Medicare Other

## 2017-11-28 ENCOUNTER — Encounter (HOSPITAL_COMMUNITY): Payer: Self-pay | Admitting: Interventional Radiology

## 2017-11-28 DIAGNOSIS — E440 Moderate protein-calorie malnutrition: Secondary | ICD-10-CM

## 2017-11-28 HISTORY — PX: IR PARACENTESIS: IMG2679

## 2017-11-28 LAB — GLUCOSE, PLEURAL OR PERITONEAL FLUID: Glucose, Fluid: 117 mg/dL

## 2017-11-28 LAB — BASIC METABOLIC PANEL
Anion gap: 19 — ABNORMAL HIGH (ref 5–15)
BUN: 37 mg/dL — ABNORMAL HIGH (ref 6–20)
CO2: 23 mmol/L (ref 22–32)
Calcium: 8.5 mg/dL — ABNORMAL LOW (ref 8.9–10.3)
Chloride: 91 mmol/L — ABNORMAL LOW (ref 101–111)
Creatinine, Ser: 8.26 mg/dL — ABNORMAL HIGH (ref 0.61–1.24)
GFR calc Af Amer: 7 mL/min — ABNORMAL LOW (ref >60)
GFR calc non Af Amer: 6 mL/min — ABNORMAL LOW (ref >60)
Glucose, Bld: 114 mg/dL — ABNORMAL HIGH (ref 65–99)
Potassium: 5.1 mmol/L (ref 3.5–5.1)
Sodium: 133 mmol/L — ABNORMAL LOW (ref 135–145)

## 2017-11-28 LAB — BODY FLUID CELL COUNT WITH DIFFERENTIAL
Eos, Fluid: 0 %
Lymphs, Fluid: 3 %
Monocyte-Macrophage-Serous Fluid: 15 % — ABNORMAL LOW (ref 50–90)
Neutrophil Count, Fluid: 82 % — ABNORMAL HIGH (ref 0–25)
Total Nucleated Cell Count, Fluid: 1105 cu mm — ABNORMAL HIGH (ref 0–1000)

## 2017-11-28 LAB — ALBUMIN, PLEURAL OR PERITONEAL FLUID: Albumin, Fluid: 1.5 g/dL

## 2017-11-28 LAB — CBC
HCT: 25.2 % — ABNORMAL LOW (ref 39.0–52.0)
Hemoglobin: 8.2 g/dL — ABNORMAL LOW (ref 13.0–17.0)
MCH: 24.6 pg — ABNORMAL LOW (ref 26.0–34.0)
MCHC: 32.5 g/dL (ref 30.0–36.0)
MCV: 75.7 fL — ABNORMAL LOW (ref 78.0–100.0)
Platelets: 227 10*3/uL (ref 150–400)
RBC: 3.33 MIL/uL — ABNORMAL LOW (ref 4.22–5.81)
RDW: 21.6 % — ABNORMAL HIGH (ref 11.5–15.5)
WBC: 10.5 10*3/uL (ref 4.0–10.5)

## 2017-11-28 LAB — AMYLASE, PLEURAL OR PERITONEAL FLUID: Amylase, Fluid: 20 U/L

## 2017-11-28 LAB — GRAM STAIN

## 2017-11-28 LAB — LACTATE DEHYDROGENASE, PLEURAL OR PERITONEAL FLUID: LD, Fluid: 114 U/L — ABNORMAL HIGH (ref 3–23)

## 2017-11-28 IMAGING — DX DG CHEST 2V
2 series · 2 of 2 positions shown · non-contrast
Comparison: 02/27/2014

CLINICAL DATA: Shortness of Breath

EXAM:
CHEST  2 VIEW

[chest pa]
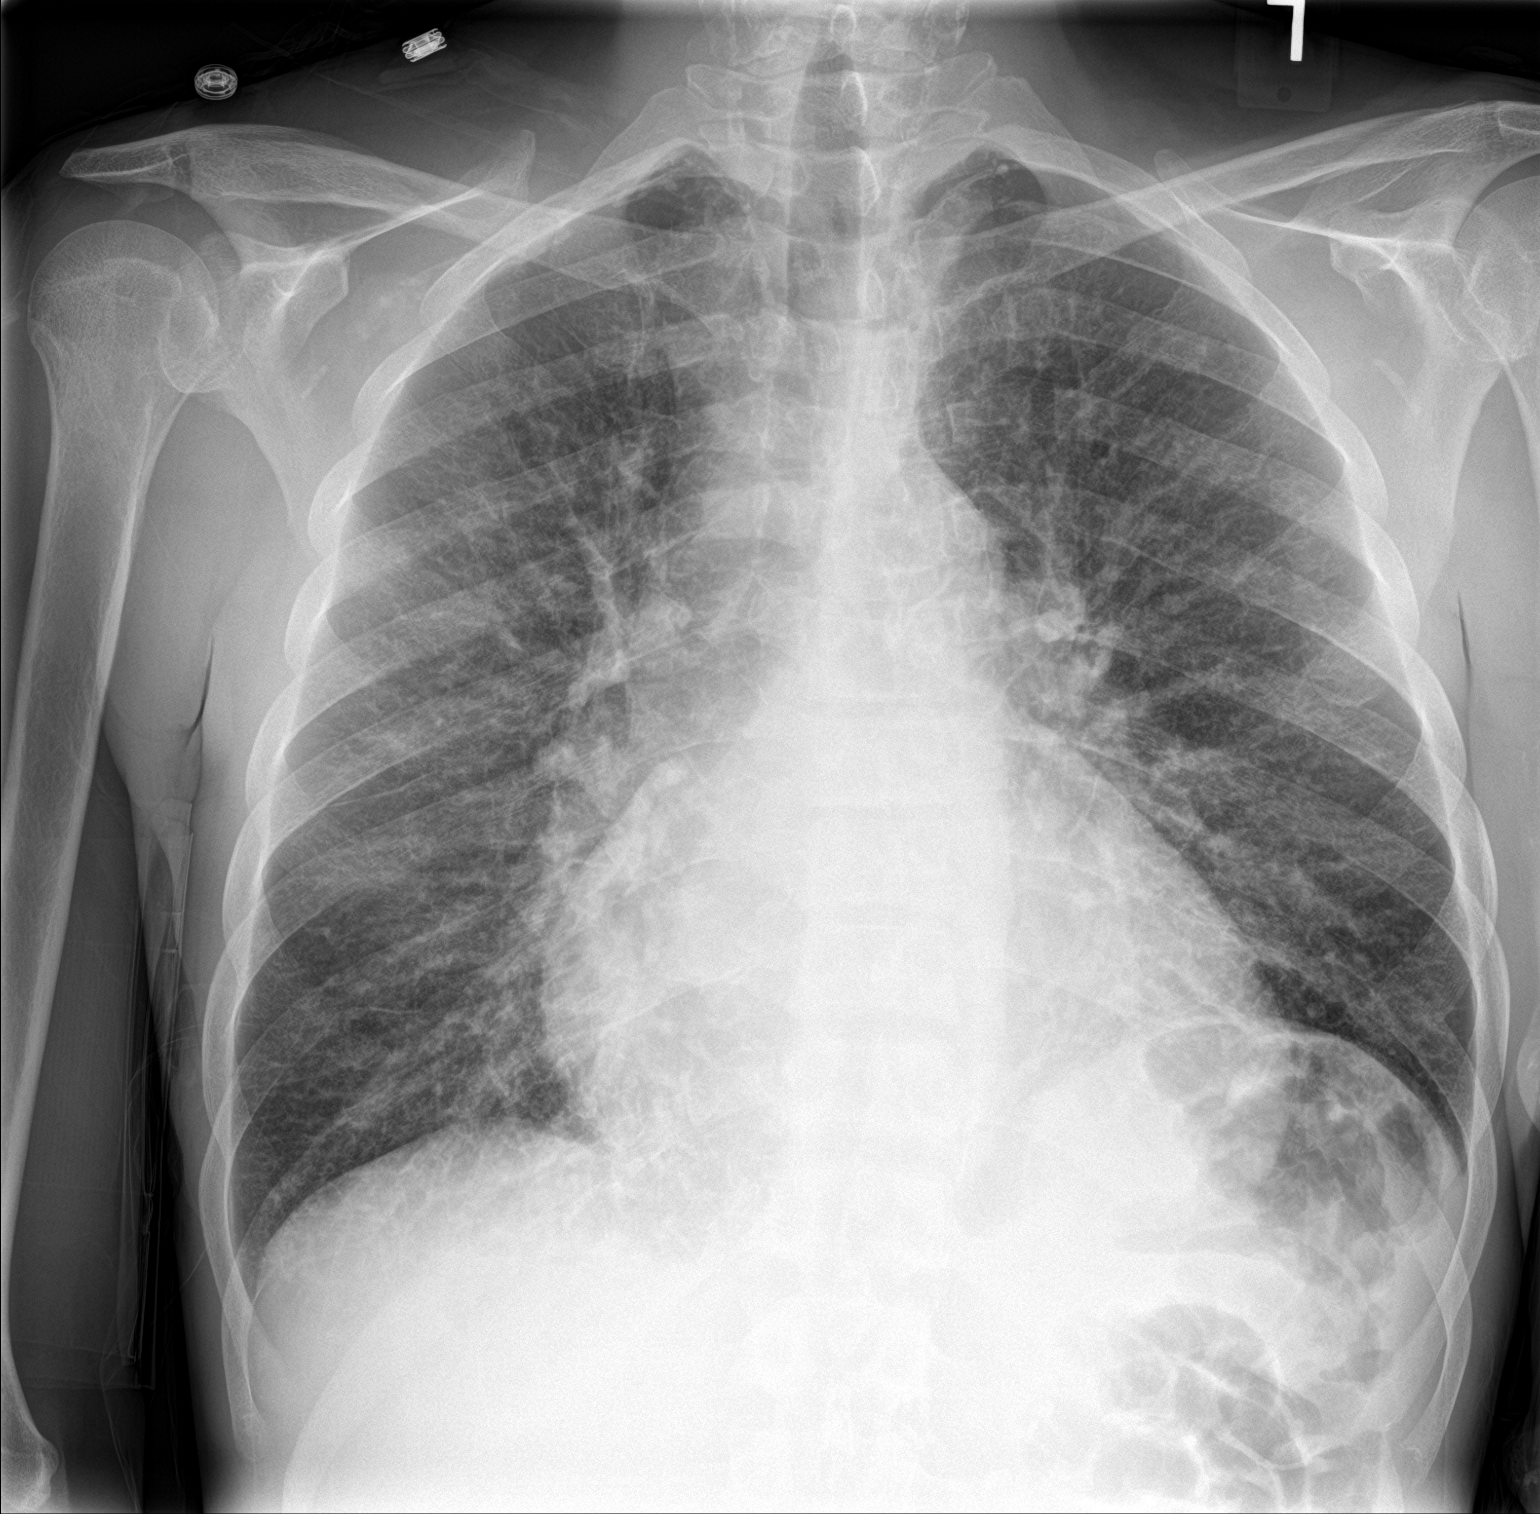

[chest lat]
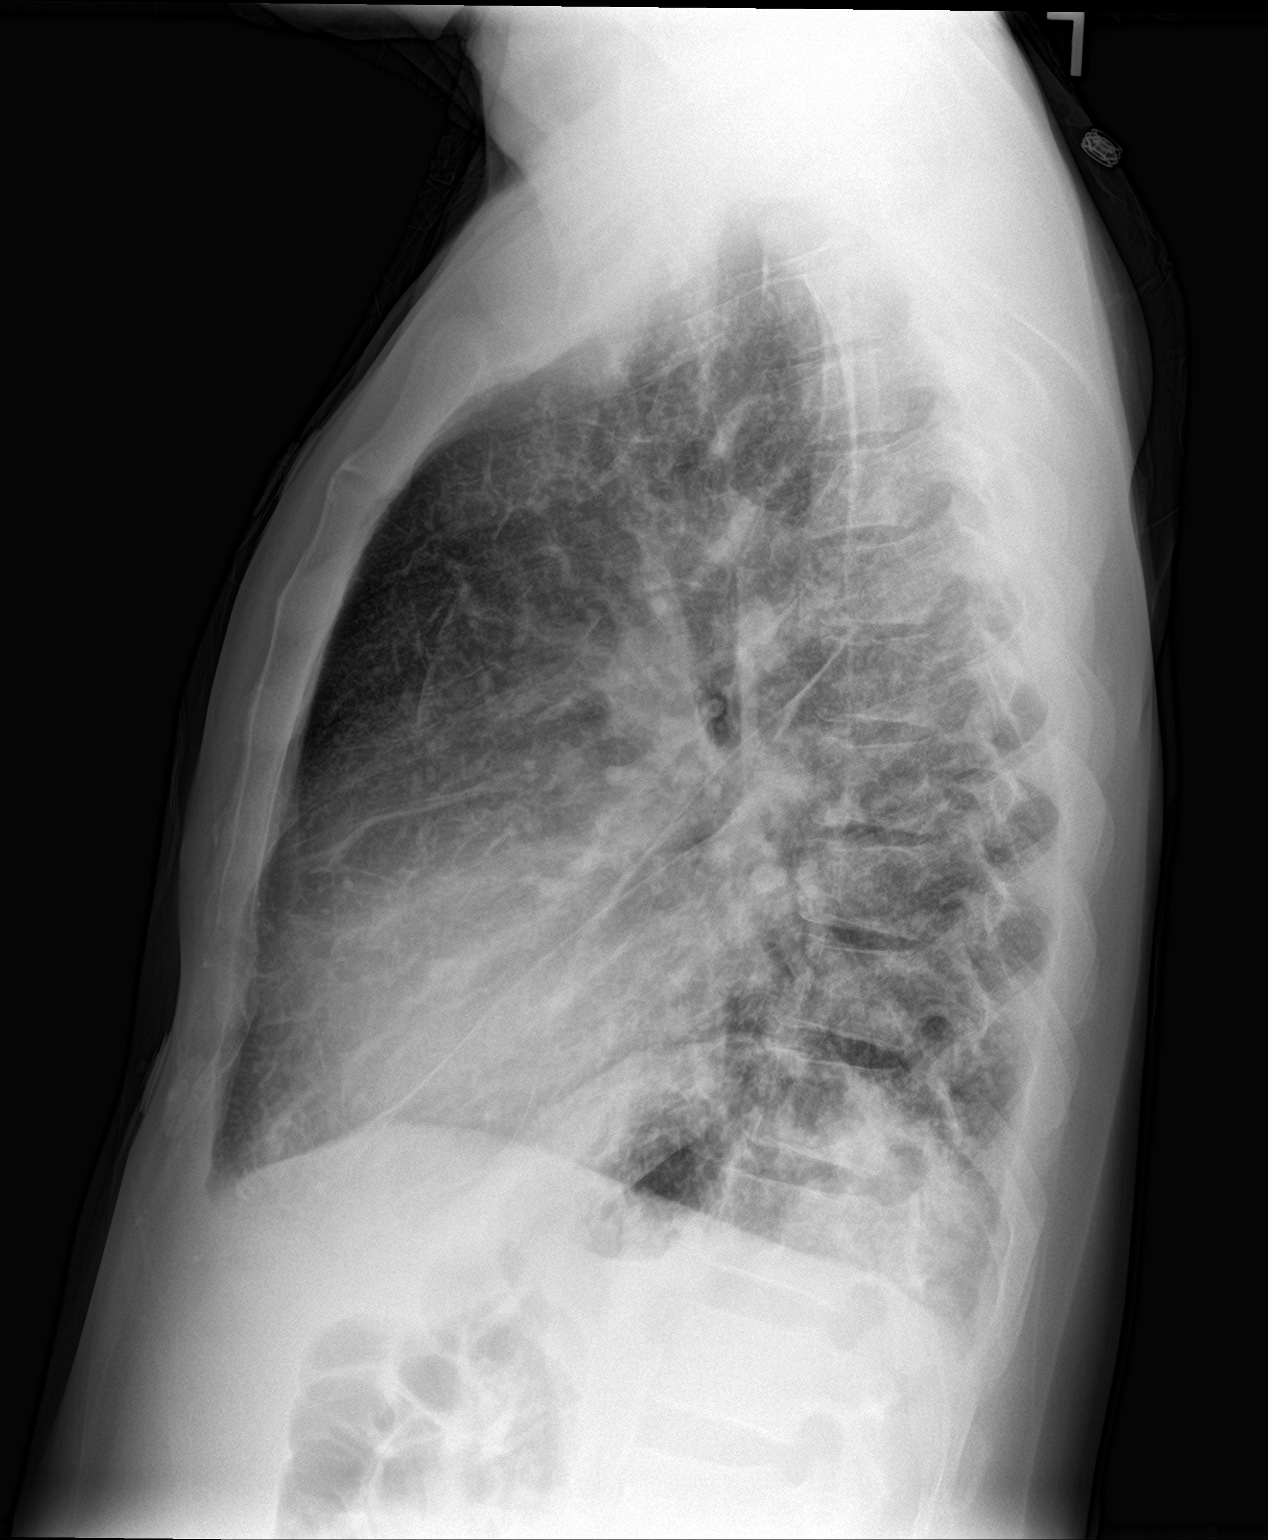

[2 of 2 positions shown; findings below may reference images not displayed]

FINDINGS: Cardiomegaly. Diffuse interstitial prominence throughout the lungs
likely reflects early interstitial edema. No confluent opacities or
effusions. No acute bony abnormality.
IMPRESSION: Cardiomegaly with diffuse interstitial prominence throughout the
lungs, likely interstitial edema.

## 2017-11-28 MED ORDER — MORPHINE SULFATE (PF) 2 MG/ML IV SOLN
0.5000 mg | INTRAVENOUS | Status: DC | PRN
  Administered 2017-11-28 – 2017-11-30 (×9): 0.5 mg via INTRAVENOUS
  Filled 2017-11-28 (×9): qty 1

## 2017-11-28 MED ORDER — CEFTAZIDIME 2 G IJ SOLR
2.0000 g | INTRAMUSCULAR | Status: DC
  Filled 2017-11-28: qty 2

## 2017-11-28 MED ORDER — LIDOCAINE HCL (PF) 2 % IJ SOLN
INTRAMUSCULAR | Status: AC
  Filled 2017-11-28: qty 20

## 2017-11-28 MED ORDER — LIDOCAINE HCL (PF) 2 % IJ SOLN
INTRAMUSCULAR | Status: AC | PRN
  Administered 2017-11-28: 10 mL

## 2017-11-28 NOTE — Procedures (Signed)
LLQ paracentesis EBL 0 Comp 0

## 2017-11-28 NOTE — Progress Notes (Signed)
Subjective:  Feels better/  sp 2.1 liter paracentesis . For hd today   Objective Vital signs in last 24 hours: Vitals:   11/28/17 0637 11/28/17 0944 11/28/17 1100 11/28/17 1243  BP: 134/78 (!) 158/80 (!) 147/79 135/67  Pulse: 97 (!) 108    Resp: 16 17    Temp: 98.9 F (37.2 C) 98.4 F (36.9 C)    TempSrc: Oral Oral    SpO2: 97% 98%    Weight:       Weight change:   Physical Exam General: thin chronically ill male,  NAD  Lungs: CTA  bilat  Heart: RRR no m, r, g  Abdomen: distended + ascites, slightly  Tender/ + bs  Lower extremities:without overt edema or ischemic changes, no open wounds  Dialysis Access: right IJ TDC and right upper AVGG mildly tender+ bruit  Dialysis Orders: [facility] M[facility] 4 hr 400/800 EDW 69 2K 2 Ca TDC and right upper AVGG no heparin parsabiv 5 / mircera 100  q 2wks  Last on  calcitriol 0.5  Isolation HD due to Hep B +   Problem/Plan: 1.  SBP -  feels better sp 2.1 paracentesis /  Wbc  To 10.5 WBC - 2075 5/8 - had been on Fortaz at outpatient HD on 13th only /- no + cultures so far-  2. ESRD -  MWF - HD today  - needs isolation due to Hep B status -  K 5.1  3. Hypertension/volume  - BP up on admit / better with med's and   sp paracent - . Hd  Today /needs some volume off but difficult to tell true EDW due to ascites -  4. Anemia  - hgb low  7.9 >8.2 -  Due   esa need to compare with outpt labs/determine ESA redose schedule 5. Metabolic bone disease -  Continue calcitriol - / renvela binder  6. Nutrition -renal diet 7. Hx cirrhosis/chronic ascites/chronic lactulose/Hep B and C 8. Hx polusubstance abuse 9. Hx medical noncompliance - 10. New right upper AVGG with erythema, tenderness - appears more like gortex reaction than infection - seen by VVS  11. Poor social support - 42 th admit in 1 months     Ernest Haber, PA-C [facility] [phone] 11/28/2017,1:29 PM  LOS: 1 day   Labs: Basic Metabolic Panel: Recent Labs  Lab  11/21/17 1954 11/26/17 2133 11/28/17 0455  NA 131* 132* 133*  K 3.8 4.7 5.1  CL 91* 89* 91*  CO2 28 28 23   GLUCOSE 154* 130* 114*  BUN 18 26* 37*  CREATININE 4.93* 6.73* 8.26*  CALCIUM 8.1* 8.3* 8.5*   Liver Function Tests: Recent Labs  Lab 11/21/17 1954 11/26/17 2133  AST 18 27  ALT 11* 9*  ALKPHOS 165* 213*  BILITOT 0.7 0.7  PROT 6.1* 6.5  ALBUMIN 2.5* 2.6*   Recent Labs  Lab 11/26/17 2133  LIPASE 24   Recent Labs  Lab 11/27/17 0225  AMMONIA 23   CBC: Recent Labs  Lab 11/21/17 1954 11/26/17 2133 11/28/17 0455  WBC 11.4* 12.3* 10.5  HGB 8.1* 7.9* 8.2*  HCT 24.6* 24.2* 25.2*  MCV 73.9* 73.1* 75.7*  PLT 157 204 227   Cardiac Enzymes: No results for input(s): CKTOTAL, CKMB, CKMBINDEX, TROPONINI in the last 168 hours. CBG: Recent Labs  Lab 11/26/17 2344  GLUCAP 106*    Studies/Results: Ct Abdomen Pelvis W Contrast  Result Date: 11/27/2017 CLINICAL DATA:  Acute onset of generalized abdominal pain and distention.  EXAM: CT ABDOMEN AND PELVIS WITH CONTRAST TECHNIQUE: Multidetector CT imaging of the abdomen and pelvis was performed using the standard protocol following bolus administration of intravenous contrast. CONTRAST:  116mL OMNIPAQUE IOHEXOL 300 MG/ML  SOLN COMPARISON:  CT of the abdomen and pelvis performed 11/16/2017 FINDINGS: Lower chest: Mild bibasilar atelectasis or scarring is noted. Scattered coronary artery calcifications are seen. Calcification is noted at the mitral valve. Hepatobiliary: The nodular contour of the liver is compatible with hepatic cirrhosis. The gallbladder is difficult to fully assess given surrounding ascites. The common bile duct remains normal in caliber. Pancreas: The pancreas is within normal limits. Spleen: The spleen is unremarkable in appearance. Adrenals/Urinary Tract: The adrenal glands are unremarkable in appearance. Severe chronic bilateral renal atrophy is noted, with scattered bilateral renal cysts and diffuse  bilateral vascular calcification. There is no evidence of hydronephrosis. No obstructing ureteral stones are seen. Stomach/Bowel: The stomach is unremarkable in appearance. The small bowel is within normal limits. The appendix is normal in caliber, without evidence of appendicitis. The colon is unremarkable in appearance. Vascular/Lymphatic: Diffuse calcification is seen along the abdominal aorta and its branches. The abdominal aorta is otherwise grossly unremarkable. The inferior vena cava is grossly unremarkable. No retroperitoneal lymphadenopathy is seen. No pelvic sidewall lymphadenopathy is identified. Reproductive: The bladder is decompressed and not well characterized. The prostate is normal in size. Other: Large volume ascites is noted within the abdomen and pelvis. There is soft tissue density layering dependently within the pelvis. This is of uncertain significance, given that fluid cultures and pathology have been negative. Musculoskeletal: No acute osseous abnormalities are identified. The visualized musculature is unremarkable in appearance. IMPRESSION: 1. Large volume ascites noted within the abdomen and pelvis, with soft tissue density again noted layering dependently in the pelvis. This is of uncertain significance, given that peritoneal fluid cultures and pathology have been negative. 2. Findings of hepatic cirrhosis. 3. Severe chronic bilateral renal atrophy, with scattered bilateral renal cysts. 4. Scattered coronary artery calcifications seen. 5. Mild bibasilar atelectasis or scarring noted. Aortic Atherosclerosis (ICD10-I70.0). Electronically Signed   By: Garald Balding M.D.   On: 11/27/2017 04:17   Ir Paracentesis  Result Date: 11/28/2017 INDICATION: Ascites EXAM: ULTRASOUND GUIDED  PARACENTESIS MEDICATIONS: None. COMPLICATIONS: None immediate. PROCEDURE: Informed written consent was obtained from the patient after a discussion of the risks, benefits and alternatives to treatment. A timeout  was performed prior to the initiation of the procedure. Initial ultrasound scanning demonstrates a large amount of ascites within the right lower abdominal quadrant. The right lower abdomen was prepped and draped in the usual sterile fashion. 1% lidocaine with epinephrine was used for local anesthesia. Following this, a 6 Fr Safe-T-Centesis catheter was introduced. An ultrasound image was saved for documentation purposes. The paracentesis was performed. The catheter was removed and a dressing was applied. The patient tolerated the procedure well without immediate post procedural complication. FINDINGS: A total of approximately 2.1 L of dark clear fluid was removed. Samples were sent to the laboratory as requested by the clinical team. IMPRESSION: Successful ultrasound-guided paracentesis yielding 2.1 liters of peritoneal fluid. Electronically Signed   By: Marybelle Killings M.D.   On: 11/28/2017 12:46   Medications: . cefTAZidime (FORTAZ)  IV    . vancomycin     . clopidogrel  75 mg Oral Daily  . diltiazem  120 mg Oral Daily  . enoxaparin (LOVENOX) injection  30 mg Subcutaneous Q24H  . famotidine  20 mg Oral QHS  . feeding supplement (  NEPRO CARB STEADY)  237 mL Oral BID BM  . fluticasone  2 spray Each Nare Daily  . gabapentin  100 mg Oral BID  . isosorbide mononitrate  30 mg Oral Daily  . lactulose  20 g Oral BID  . lidocaine      . loratadine  10 mg Oral Daily  . mometasone-formoterol  2 puff Inhalation BID  . nicotine  14 mg Transdermal Daily  . pantoprazole  40 mg Oral Daily  . sevelamer carbonate  1,600 mg Oral With snacks  . sevelamer carbonate  3,200 mg Oral TID WC  . tiotropium  18 mcg Inhalation Daily

## 2017-11-28 NOTE — Progress Notes (Signed)
[facility] PROGRESS NOTE  [name] VFI:[id number] DOB: 1961/04/18 DOA: 11/26/2017  PCP: [name], MD  Brief History/Interval Summary: 57 y.o. 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  He was recently hospitalized from 5/7 to 11/19/2017.  Diagnoses at discharge was persistent spontaneous bacterial peritonitis complicated by sepsis. On May 4 he had left the hospital [facility] but presented back to the emergency department on May 7 with worsening abdominal pain which was constant and severe in intensity.  He was treated for persistent spontaneous bacterial peritonitis having left without completing his course of therapy.  This is his 17th admission to the hospital in 12 months.  Patient does frequently leave [facility].  Reason for Visit: Nominal pain with concern for spontaneous bacterial peritonitis  Consultants: Nephrology.  Vascular surgery.  Procedures: Paracentesis is pending  Antibiotics: On vancomycin and ceftazidime  Subjective/Interval History: Patient complains of abdominal pain.  No nausea vomiting.  Denies any shortness of breath.  Pain is 7 out of 10 in intensity.  No radiation.  ROS: Denies any headaches  Objective:  Vital Signs  Vitals:   11/28/17 0547 11/28/17 0637 11/28/17 0944 11/28/17 1100  BP: 125/72 134/78 (!) 158/80 (!) 147/79  Pulse: (!) 101 97 (!) 108   Resp: 18 16 17    Temp: 99.1 F (37.3 C) 98.9 F (37.2 C) 98.4 F (36.9 C)   TempSrc: Oral Oral Oral   SpO2: 96% 97% 98%   Weight:        Intake/Output Summary (Last 24  hours) at 11/28/2017 1224 Last data filed at 11/28/2017 0600 Gross per 24 hour  Intake 630 ml  Output 0 ml  Net 630 ml   Filed Weights   11/27/17 1222 11/28/17 0500  Weight: 69.2 kg (152 lb 8.9 oz) 69.2 kg (152 lb 8.9 oz)    General appearance: alert, cooperative, appears stated age and no distress Resp: clear to auscultation bilaterally Cardio: regular rate and rhythm, S1, S2 normal, no murmur, click, rub or gallop GI: Abdomen is noted to be distended with everted umbilicus.  Diffusely tender without any rebound rigidity or guarding.  No masses. Extremities: extremities normal, atraumatic, no cyanosis or edema Neurologic: Alert and oriented x3.  No focal neurological deficits.  Lab Results:  Data Reviewed: I have personally reviewed following labs and imaging studies  CBC: Recent Labs  Lab 11/21/17 1954 11/26/17 2133 11/28/17 0455  WBC 11.4* 12.3* 10.5  HGB 8.1* 7.9* 8.2*  HCT 24.6* 24.2* 25.2*  MCV 73.9* 73.1* 75.7*  PLT 157 204 416    Basic Metabolic Panel: Recent Labs  Lab 11/21/17 1954 11/26/17 2133 11/28/17 0455  NA 131* 132* 133*  K 3.8 4.7 5.1  CL 91* 89* 91*  CO2 28 28 23   GLUCOSE 154* 130* 114*  BUN 18 26* 37*  CREATININE 4.93* 6.73* 8.26*  CALCIUM 8.1* 8.3* 8.5*    GFR: Estimated Creatinine Clearance: 9.9 mL/min (A) (by C-G formula based on SCr of 8.26 mg/dL (H)).  Liver Function Tests: Recent Labs  Lab 11/21/17 1954 11/26/17 2133  AST 18 27  ALT 11* 9*  ALKPHOS 165* 213*  BILITOT 0.7 0.7  PROT 6.1*  6.5  ALBUMIN 2.5* 2.6*    Recent Labs  Lab 11/26/17 2133  LIPASE 24   Recent Labs  Lab 11/27/17 0225  AMMONIA 23    Coagulation Profile: Recent Labs  Lab 11/21/17 1954 11/27/17 0225  INR 1.26 1.30    CBG: Recent Labs  Lab 11/26/17 2344  GLUCAP 106*     Recent Results (from the past 240 hour(s))  Surgical pcr screen     Status: None   Collection Time: 11/21/17  5:06 PM  Result Value Ref Range Status   MRSA, PCR  NEGATIVE NEGATIVE Final   Staphylococcus aureus NEGATIVE NEGATIVE Final    Comment: (NOTE) The Xpert SA Assay (FDA approved for NASAL specimens in patients 36 years of age and older), is one component of a comprehensive surveillance program. It is not intended to diagnose infection nor to guide or monitor treatment. Performed at [facility], [place] [address], [place], [place] [address]       Radiology Studies: Ct Abdomen Pelvis W Contrast  Result Date: 11/27/2017 CLINICAL DATA:  Acute onset of generalized abdominal pain and distention. EXAM: CT ABDOMEN AND PELVIS WITH CONTRAST TECHNIQUE: Multidetector CT imaging of the abdomen and pelvis was performed using the standard protocol following bolus administration of intravenous contrast. CONTRAST:  111mL OMNIPAQUE IOHEXOL 300 MG/ML  SOLN COMPARISON:  CT of the abdomen and pelvis performed 11/16/2017 FINDINGS: Lower chest: Mild bibasilar atelectasis or scarring is noted. Scattered coronary artery calcifications are seen. Calcification is noted at the mitral valve. Hepatobiliary: The nodular contour of the liver is compatible with hepatic cirrhosis. The gallbladder is difficult to fully assess given surrounding ascites. The common bile duct remains normal in caliber. Pancreas: The pancreas is within normal limits. Spleen: The spleen is unremarkable in appearance. Adrenals/Urinary Tract: The adrenal glands are unremarkable in appearance. Severe chronic bilateral renal atrophy is noted, with scattered bilateral renal cysts and diffuse bilateral vascular calcification. There is no evidence of hydronephrosis. No obstructing ureteral stones are seen. Stomach/Bowel: The stomach is unremarkable in appearance. The small bowel is within normal limits. The appendix is normal in caliber, without evidence of appendicitis. The colon is unremarkable in appearance. Vascular/Lymphatic: Diffuse calcification is seen along the abdominal aorta and its branches. The  abdominal aorta is otherwise grossly unremarkable. The inferior vena cava is grossly unremarkable. No retroperitoneal lymphadenopathy is seen. No pelvic sidewall lymphadenopathy is identified. Reproductive: The bladder is decompressed and not well characterized. The prostate is normal in size. Other: Large volume ascites is noted within the abdomen and pelvis. There is soft tissue density layering dependently within the pelvis. This is of uncertain significance, given that fluid cultures and pathology have been negative. Musculoskeletal: No acute osseous abnormalities are identified. The visualized musculature is unremarkable in appearance. IMPRESSION: 1. Large volume ascites noted within the abdomen and pelvis, with soft tissue density again noted layering dependently in the pelvis. This is of uncertain significance, given that peritoneal fluid cultures and pathology have been negative. 2. Findings of hepatic cirrhosis. 3. Severe chronic bilateral renal atrophy, with scattered bilateral renal cysts. 4. Scattered coronary artery calcifications seen. 5. Mild bibasilar atelectasis or scarring noted. Aortic Atherosclerosis (ICD10-I70.0). Electronically Signed   By: Garald Balding M.D.   On: 11/27/2017 04:17     Medications:  Scheduled: . clopidogrel  75 mg Oral Daily  . diltiazem  120 mg Oral Daily  . enoxaparin (LOVENOX) injection  30 mg Subcutaneous Q24H  . famotidine  20 mg Oral QHS  .  feeding supplement (NEPRO CARB STEADY)  237 mL Oral BID BM  . fluticasone  2 spray Each Nare Daily  . gabapentin  100 mg Oral BID  . isosorbide mononitrate  30 mg Oral Daily  . lactulose  20 g Oral BID  . lidocaine      . loratadine  10 mg Oral Daily  . mometasone-formoterol  2 puff Inhalation BID  . nicotine  14 mg Transdermal Daily  . pantoprazole  40 mg Oral Daily  . sevelamer carbonate  1,600 mg Oral With snacks  . sevelamer carbonate  3,200 mg Oral TID WC  . tiotropium  18 mcg Inhalation Daily    Continuous: . cefTAZidime (FORTAZ)  IV    . vancomycin     GDJ:MEQASTMHD, hydrALAZINE, ibuprofen, ketorolac, lidocaine, morphine injection, ondansetron **OR** ondansetron (ZOFRAN) IV, oxyCODONE, polyethylene glycol  Assessment/Plan:    Abdominal pain thought to be secondary to spontaneous bacterial peritonitis Paracentesis to be performed today by radiology.  The patient has been empirically started on vancomycin and ceftazidime which will be continued.  Positive cultures identified in the last few months.  However peritoneal fluid analysis done previously has shown elevated WBC suggestive of infection.  Continue to monitor.  Erythema over the graft site Seen by vascular surgery.  Thought to be reaction to gore-tex rather than an infection.  Continue to monitor.  End-stage renal disease on hemodialysis on Monday Wednesday Friday Nephrology has been consulted.  Today is his regular dialysis today.  History of liver cirrhosis with hepatitis B and C/history of hepatic encephalopathy Noted to have ascites on CT scan.  Patient takes lactulose at home which is being continued.  Soft tissue density noted in the pelvic area on the CT scan previously identified as chronic peritoneal thickening.  Significance unclear.  Anemia of chronic disease Monitor hemoglobin.  Stable.  Malnutrition of moderate degree Encourage oral intake.  History of alcohol abuse States that he has not been drinking recently.  Continue to monitor.  Mitral stenosis Currently asymptomatic.  DVT Prophylaxis: Lovenox    Code Status: Full code Family Communication: Discussed with the patient Disposition Plan: Management as outlined above.  Will likely go home when medically improved.    LOS: 1 day   [facility] Pager [phone] 11/28/2017, 12:24 PM  If 7PM-7AM, please contact night-coverage at [email or web address], password [facility]

## 2017-11-28 NOTE — Progress Notes (Addendum)
  Progress Note    11/28/2017 8:12 AM * No surgery found *  Subjective:  No complaints; says he's not eating much  Tm 99.1 now 98.9 VSS  Vitals:   11/28/17 0547 11/28/17 0637  BP: 125/72 134/78  Pulse: (!) 101 97  Resp: 18 16  Temp: 99.1 F (37.3 C) 98.9 F (37.2 C)  SpO2: 96% 97%    Physical Exam: General:  Laying in bed in no distress Lungs:  Non labored Incisions:  Antecubital and axillary incisions look fine.  Slight, superficial separation of the antecubital incision.   Extremities:  +thrill/bruit within the graft; there is some erythema noted around the distal portion of the graft.  No fluctuance noted.    CBC    Component Value Date/Time   WBC 10.5 11/28/2017 0455   RBC 3.33 (L) 11/28/2017 0455   HGB 8.2 (L) 11/28/2017 0455   HCT 25.2 (L) 11/28/2017 0455   PLT 227 11/28/2017 0455   MCV 75.7 (L) 11/28/2017 0455   MCH 24.6 (L) 11/28/2017 0455   MCHC 32.5 11/28/2017 0455   RDW 21.6 (H) 11/28/2017 0455   LYMPHSABS 1.1 11/19/2017 0243   MONOABS 1.7 (H) 11/19/2017 0243   EOSABS 0.1 11/19/2017 0243   BASOSABS 0.0 11/19/2017 0243    BMET    Component Value Date/Time   NA 133 (L) 11/28/2017 0455   K 5.1 11/28/2017 0455   CL 91 (L) 11/28/2017 0455   CO2 23 11/28/2017 0455   GLUCOSE 114 (H) 11/28/2017 0455   BUN 37 (H) 11/28/2017 0455   CREATININE 8.26 (H) 11/28/2017 0455   CREATININE 9.18 (H) 02/15/2017 1425   CALCIUM 8.5 (L) 11/28/2017 0455   CALCIUM 8.1 (L) 07/11/2011 1117   GFRNONAA 6 (L) 11/28/2017 0455   GFRNONAA 6 (L) 02/15/2017 1425   GFRAA 7 (L) 11/28/2017 0455   GFRAA 7 (L) 02/15/2017 1425    INR    Component Value Date/Time   INR 1.30 11/27/2017 0225     Intake/Output Summary (Last 24 hours) at 11/28/2017 0947 Last data filed at 11/28/2017 0600 Gross per 24 hour  Intake 730 ml  Output 0 ml  Net 730 ml     Assessment:  57 y.o. male is s/p:  RUA AV graft placement 11/22/17  Plan: -there is some erythema around the distal portion  of the graft most likely related to Gore-Tex reaction.  No fluctuance noted.  WBC improved from last evening.   -continue to monitor as cannot r/o infection.   Leontine Locket, PA-C [facility] [phone] 11/28/2017 8:12 AM   I have examined the patient, reviewed and agree with above.  Right upper arm unchanged.  No fluctuance.  Typical of new prosthetic Gore-Tex graft placement.  Not concerned regarding infection.  Will not follow actively.  Please call if we can assist  Curt Jews, MD 11/28/2017 3:55 PM

## 2017-11-28 NOTE — Progress Notes (Signed)
Dr. Joelyn Oms rescheduled 5/20 HD tx for 5/21 using same HD orders for 5/20 tx already modified in Epic. Floor notified

## 2017-11-29 DIAGNOSIS — K7031 Alcoholic cirrhosis of liver with ascites: Secondary | ICD-10-CM

## 2017-11-29 DIAGNOSIS — K652 Spontaneous bacterial peritonitis: Principal | ICD-10-CM

## 2017-11-29 DIAGNOSIS — Z888 Allergy status to other drugs, medicaments and biological substances status: Secondary | ICD-10-CM

## 2017-11-29 DIAGNOSIS — F17210 Nicotine dependence, cigarettes, uncomplicated: Secondary | ICD-10-CM

## 2017-11-29 DIAGNOSIS — Z8619 Personal history of other infectious and parasitic diseases: Secondary | ICD-10-CM

## 2017-11-29 DIAGNOSIS — Z992 Dependence on renal dialysis: Secondary | ICD-10-CM

## 2017-11-29 DIAGNOSIS — Z885 Allergy status to narcotic agent status: Secondary | ICD-10-CM

## 2017-11-29 DIAGNOSIS — Z886 Allergy status to analgesic agent status: Secondary | ICD-10-CM

## 2017-11-29 DIAGNOSIS — N186 End stage renal disease: Secondary | ICD-10-CM

## 2017-11-29 DIAGNOSIS — B181 Chronic viral hepatitis B without delta-agent: Secondary | ICD-10-CM

## 2017-11-29 LAB — CBC
HCT: 23.5 % — ABNORMAL LOW (ref 39.0–52.0)
Hemoglobin: 7.7 g/dL — ABNORMAL LOW (ref 13.0–17.0)
MCH: 24.1 pg — ABNORMAL LOW (ref 26.0–34.0)
MCHC: 32.8 g/dL (ref 30.0–36.0)
MCV: 73.4 fL — ABNORMAL LOW (ref 78.0–100.0)
Platelets: 231 10*3/uL (ref 150–400)
RBC: 3.2 MIL/uL — ABNORMAL LOW (ref 4.22–5.81)
RDW: 21.1 % — ABNORMAL HIGH (ref 11.5–15.5)
WBC: 7.8 10*3/uL (ref 4.0–10.5)

## 2017-11-29 LAB — RENAL FUNCTION PANEL
Albumin: 2.3 g/dL — ABNORMAL LOW (ref 3.5–5.0)
Anion gap: 14 (ref 5–15)
BUN: 42 mg/dL — ABNORMAL HIGH (ref 6–20)
CO2: 27 mmol/L (ref 22–32)
Calcium: 8.5 mg/dL — ABNORMAL LOW (ref 8.9–10.3)
Chloride: 92 mmol/L — ABNORMAL LOW (ref 101–111)
Creatinine, Ser: 9.07 mg/dL — ABNORMAL HIGH (ref 0.61–1.24)
GFR calc Af Amer: 7 mL/min — ABNORMAL LOW (ref >60)
GFR calc non Af Amer: 6 mL/min — ABNORMAL LOW (ref >60)
Glucose, Bld: 90 mg/dL (ref 65–99)
Phosphorus: 4.9 mg/dL — ABNORMAL HIGH (ref 2.5–4.6)
Potassium: 5.3 mmol/L — ABNORMAL HIGH (ref 3.5–5.1)
Sodium: 133 mmol/L — ABNORMAL LOW (ref 135–145)

## 2017-11-29 LAB — PH, BODY FLUID: pH, Body Fluid: 7.7

## 2017-11-29 LAB — PATHOLOGIST SMEAR REVIEW: Path Review: REACTIVE

## 2017-11-29 MED ORDER — VANCOMYCIN HCL IN DEXTROSE 750-5 MG/150ML-% IV SOLN
INTRAVENOUS | Status: AC
  Administered 2017-11-29: 750 mg
  Filled 2017-11-29: qty 150

## 2017-11-29 MED ORDER — ALPRAZOLAM 0.5 MG PO TABS
0.5000 mg | ORAL_TABLET | Freq: Once | ORAL | Status: AC
  Administered 2017-11-29: 0.5 mg via ORAL
  Filled 2017-11-29: qty 1

## 2017-11-29 MED ORDER — VANCOMYCIN HCL IN DEXTROSE 750-5 MG/150ML-% IV SOLN: 750.0000 mg | INTRAVENOUS | Status: AC

## 2017-11-29 MED ORDER — LIDOCAINE HCL (PF) 1 % IJ SOLN: 5.0000 mL | INTRAMUSCULAR | Status: DC | PRN

## 2017-11-29 MED ORDER — SODIUM CHLORIDE 0.9 % IV SOLN
2.0000 g | INTRAVENOUS | Status: DC
  Administered 2017-12-01: 2 g via INTRAVENOUS
  Filled 2017-11-29 (×2): qty 2

## 2017-11-29 MED ORDER — CALCITRIOL 0.5 MCG PO CAPS: 0.5000 ug | ORAL_CAPSULE | ORAL | Status: DC

## 2017-11-29 MED ORDER — DARBEPOETIN ALFA 100 MCG/0.5ML IJ SOSY
100.0000 ug | PREFILLED_SYRINGE | INTRAMUSCULAR | Status: DC
  Administered 2017-12-01: 100 ug via INTRAVENOUS
  Filled 2017-11-29: qty 0.5

## 2017-11-29 MED ORDER — SODIUM CHLORIDE 0.9 % IV SOLN: 100.0000 mL | INTRAVENOUS | Status: DC | PRN

## 2017-11-29 MED ORDER — ALTEPLASE 2 MG IJ SOLR: 2.0000 mg | Freq: Once | INTRAMUSCULAR | Status: DC | PRN

## 2017-11-29 MED ORDER — LIDOCAINE-PRILOCAINE 2.5-2.5 % EX CREA: 1.0000 "application " | TOPICAL_CREAM | CUTANEOUS | Status: DC | PRN

## 2017-11-29 MED ORDER — DARBEPOETIN ALFA 40 MCG/0.4ML IJ SOSY: 40.0000 ug | PREFILLED_SYRINGE | INTRAMUSCULAR | Status: DC

## 2017-11-29 MED ORDER — SODIUM CHLORIDE 0.9 % IV SOLN
2.0000 g | Freq: Once | INTRAVENOUS | Status: DC
  Administered 2017-11-29: 2 g via INTRAVENOUS
  Filled 2017-11-29: qty 2

## 2017-11-29 MED ORDER — HEPARIN SODIUM (PORCINE) 1000 UNIT/ML DIALYSIS: 1000.0000 [IU] | INTRAMUSCULAR | Status: DC | PRN

## 2017-11-29 MED ORDER — PENTAFLUOROPROP-TETRAFLUOROETH EX AERO: 1.0000 "application " | INHALATION_SPRAY | CUTANEOUS | Status: DC | PRN

## 2017-11-29 NOTE — Consult Note (Addendum)
[facility] for Infectious Disease  Total days of antibiotics 1               Reason for Consult: recurrent spontaneous bacterial peritonitis    Referring Physician: [place] Pink  Principal Problem:   SBP (spontaneous bacterial peritonitis) ([place]) Active Problems:   Alcohol abuse   Cigarette smoker   Chronic hepatitis B (HCC)   Chronic hepatitis C without hepatic coma (HCC)   Thrombocytopenia (HCC)   Problem with dialysis access [facility])   Essential hypertension   COPD (chronic obstructive pulmonary disease) (HCC)   CKD (chronic kidney disease) stage V requiring chronic dialysis ([place])   Infection of AV graft for dialysis ([place])   Anxiety   Anemia due to chronic kidney disease   Mitral stenosis   Malnutrition of moderate degree   Liver cirrhosis (HCC)    HPI: [name] is a 57 y.o. male with complicated past med history of ESRD on HD and hx of hep C GT1a treatment, chronic hep B c/b alcoholic cirrhosis , recently had RUA AV graft placement on 5/14 hwere he complains of swelling,pain and erythema in addn to having increasing abdominal girth and abdominal pain. Interestingly since early April, he has been treated for recurrent SBP, but mostly it has been culture negative neutrocytic ascites, recently finished fortaz x 4 days in early May, treatment discontinued since cx were negative, but still had high neutrophilic predominant ascites with WBC in 2000 with 75% N.on previous admission, The patient did have ascites cytology testing in early April which was negative for malignancy. It is unclear if he is on SBP prophylaxis. He underwent 2L paracentesis yesterday feeling mild relief. Currently on vancomycin and ceftaz  Past Medical History:  Diagnosis Date  . Adenomatous colon polyp    tubular  . Anemia   . Anxiety   . Arthritis    left shoulder  . Atherosclerosis of aorta ([place])   . Cardiomegaly   . Chest pain    DATE UNKNOWN, C/O PERIODICALLY  . Cocaine abuse ([place])     . COPD exacerbation ([place]) 08/17/2016  . Coronary artery disease    stent 02/22/17  . Dialysis patient ([place])    Monday-Wednesday-Friday  . ESRD (end stage renal disease) on dialysis ([place])    "[name]; MWF" (07/04/2017)  . GERD (gastroesophageal reflux disease)    DATE UNKNOWN  . Hemorrhoids   . Hepatitis B, chronic ([place])   . Hepatitis C   . History of kidney stones   . Hyperkalemia   . Hypertension   . Metabolic bone disease    Patient denies  . Mitral stenosis   . Myocardial infarction ([place])   . Pneumonia   . Pulmonary edema   . Renal disorder   . Renal insufficiency   . Shortness of breath dyspnea    " for the last past year with this dialysis"  . Solitary rectal ulcer syndrome   . Tubular adenoma of colon     Allergies:  Allergies  Allergen Reactions  . Aspirin Other (See Comments)    STOMACH PAIN  . Clonidine Derivatives Itching  . Tramadol Itching  . Tylenol [Acetaminophen] Nausea Only    Stomach ache     MEDICATIONS: . clopidogrel  75 mg Oral Daily  . diltiazem  120 mg Oral Daily  . enoxaparin (LOVENOX) injection  30 mg Subcutaneous Q24H  . famotidine  20 mg Oral QHS  . feeding supplement (NEPRO CARB STEADY)  237 mL Oral  BID BM  . fluticasone  2 spray Each Nare Daily  . gabapentin  100 mg Oral BID  . isosorbide mononitrate  30 mg Oral Daily  . lactulose  20 g Oral BID  . loratadine  10 mg Oral Daily  . mometasone-formoterol  2 puff Inhalation BID  . nicotine  14 mg Transdermal Daily  . pantoprazole  40 mg Oral Daily  . sevelamer carbonate  1,600 mg Oral With snacks  . sevelamer carbonate  3,200 mg Oral TID WC  . tiotropium  18 mcg Inhalation Daily    Social History   Tobacco Use  . Smoking status: Current Every Day Smoker    Packs/day: 0.50    Years: 43.00    Pack years: 21.50    Types: Cigarettes    Start date: 08/13/1973  . Smokeless tobacco: Never Used  Substance Use Topics  . Alcohol use: Not Currently    Frequency: Never    Comment:  quit drinking in 2017  . Drug use: Not Currently    Comment: quit in 2017"    Family History  Problem Relation Age of Onset  . Heart disease Mother   . Lung cancer Mother   . Heart disease Father   . Malignant hyperthermia Father   . COPD Father   . Throat cancer Sister   . Esophageal cancer Sister   . Hypertension Other   . COPD Other   . Colon cancer Neg Hx   . Colon polyps Neg Hx   . Rectal cancer Neg Hx   . Stomach cancer Neg Hx     Review of Systems -  +abdominal pain, girth, 12 point ros is negative  OBJECTIVE: Temp:  [97.8 F (36.6 C)-99.3 F (37.4 C)] 97.8 F (36.6 C) (05/21 1125) Pulse Rate:  [73-107] 73 (05/21 1125) Resp:  [16-18] 18 (05/21 1125) BP: (142-177)/(70-93) 155/71 (05/21 1125) SpO2:  [95 %-98 %] 98 % (05/21 1125) Weight:  [147 lb 11.3 oz (67 kg)-154 lb 8.7 oz (70.1 kg)] 147 lb 11.3 oz (67 kg) (05/21 1125) Physical Exam  Constitutional: He is oriented to person, place, and time. He appears chronically ill, malnourished. No distress.  HENT:  Mouth/Throat: Oropharynx is clear and moist. No oropharyngeal exudate.  Cardiovascular: Normal rate, regular rhythm and normal heart sounds. Exam reveals no gallop and no friction rub.  No murmur heard.  Pulmonary/Chest: Effort normal and breath sounds normal. No respiratory distress. He has no wheezes.  Chest wall = catheter Abdominal: fluid wave, protuberant. Mild + tenderness.  Neurological: He is alert and oriented to person, place, and time.  Skin: Skin is warm and dry. No rash noted. No erythema.  Psychiatric: He has a normal mood and affect. His behavior is normal.    LABS: Results for orders placed or performed during the hospital encounter of 11/26/17 (from the past 48 hour(s))  Basic metabolic panel     Status: Abnormal   Collection Time: 11/28/17  4:55 AM  Result Value Ref Range   Sodium 133 (L) 135 - 145 mmol/L   Potassium 5.1 3.5 - 5.1 mmol/L   Chloride 91 (L) 101 - 111 mmol/L   CO2 23 22 -  32 mmol/L   Glucose, Bld 114 (H) 65 - 99 mg/dL   BUN 37 (H) 6 - 20 mg/dL   Creatinine, Ser 8.26 (H) 0.61 - 1.24 mg/dL   Calcium 8.5 (L) 8.9 - 10.3 mg/dL   GFR calc non Af Amer 6 (L) >60 mL/min  GFR calc Af Amer 7 (L) >60 mL/min    Comment: (NOTE) The eGFR has been calculated using the CKD EPI equation. This calculation has not been validated in all clinical situations. eGFR's persistently <60 mL/min signify possible Chronic Kidney Disease.    Anion gap 19 (H) 5 - 15    Comment: Performed at [facility], [place] [address], [place], [place] [address]  CBC     Status: Abnormal   Collection Time: 11/28/17  4:55 AM  Result Value Ref Range   WBC 10.5 4.0 - 10.5 K/uL   RBC 3.33 (L) 4.22 - 5.81 MIL/uL   Hemoglobin 8.2 (L) 13.0 - 17.0 g/dL   HCT 25.2 (L) 39.0 - 52.0 %   MCV 75.7 (L) 78.0 - 100.0 fL   MCH 24.6 (L) 26.0 - 34.0 pg   MCHC 32.5 30.0 - 36.0 g/dL   RDW 21.6 (H) 11.5 - 15.5 %   Platelets 227 150 - 400 K/uL    Comment: Performed at [facility], [place] [address], [place], [place] [address]  Lactate dehydrogenase (pleural or peritoneal fluid)     Status: Abnormal   Collection Time: 11/28/17  1:22 PM  Result Value Ref Range   LD, Fluid 114 (H) 3 - 23 U/L    Comment: (NOTE) Results should be evaluated in conjunction with serum values    Fluid Type-FLDH Peritoneal     Comment: Performed at [facility], [place] [address] [place], [place] [address]  Body fluid cell count with differential     Status: Abnormal   Collection Time: 11/28/17  1:22 PM  Result Value Ref Range   Fluid Type-FCT Peritoneal    Color, Fluid YELLOW YELLOW   Appearance, Fluid CLOUDY (A) CLEAR   WBC, Fluid 1,105 (H) 0 - 1,000 cu mm   Neutrophil Count, Fluid 82 (H) 0 - 25 %   Lymphs, Fluid 3 %   Monocyte-Macrophage-Serous Fluid 15 (L) 50 - 90 %   Eos, Fluid 0 %   Other Cells, Fluid MESOTHELIAL CELLS SEEN %    Comment: Performed at [facility] [address], [place], [place] [address]    Gram stain     Status: None   Collection Time: 11/28/17  1:22 PM  Result Value Ref Range   Specimen Description PERITONEAL    Special Requests NONE    Gram Stain      ABUNDANT WBC PRESENT,BOTH PMN AND MONONUCLEAR NO ORGANISMS SEEN Performed at [facility], [address], [place], [place] [address]    Report Status 11/28/2017 FINAL   Amylase, pleural or peritoneal fluid     Status: None   Collection Time: 11/28/17  1:22 PM  Result Value Ref Range   Amylase, Fluid 20 U/L    Comment: NO NORMAL RANGE ESTABLISHED FOR THIS TEST Performed at [facility], [address], [place], [place] [address]    Fluid Type-FAMY Peritoneal     Comment: Performed at [facility], [place] [address], [place], [place] [address]  Albumin, pleural or peritoneal fluid     Status: None   Collection Time: 11/28/17  1:22 PM  Result Value Ref Range   Albumin, Fluid 1.5 g/dL    Comment: (NOTE) No normal range established for this test Results should be evaluated in conjunction with serum values    Fluid Type-FALB Peritoneal     Comment: Performed at [facility], [place] [address]  [name], [place] [address]  Glucose, pleural or peritoneal fluid     Status: None   Collection Time: 11/28/17  1:22 PM  Result Value Ref Range   Glucose, Fluid 117 mg/dL    Comment: (NOTE) No normal range established for this test Results should be evaluated in conjunction with serum values    Fluid Type-FGLU Peritoneal     Comment: Performed at [facility], [place] [address] [place], [place] [address]  PH, Body Fluid     Status: None   Collection Time: 11/28/17  1:22 PM  Result Value Ref Range   pH, Body Fluid 7.7 Not Estab.    Comment: (NOTE) This test was developed and its performance characteristics determined by [facility]. It has not been cleared or approved by the Food and Drug Administration. Performed At: [facility] [place], [place] [id number] Rush Farmer MD [id number]    Source of Sample PERITONEAL     Comment: Performed at [facility], [place] [address], [place], [place] [address]  Pathologist smear review     Status: None   Collection Time: 11/28/17  1:22 PM  Result Value Ref Range   Path Review      Abundant neutrophils and reactive appearing mesothelial cells    Comment: Reviewed by Lennox Solders. Lyndon Code, M.D. 11/29/2017 Performed at [facility], [place] [address], [place], [place] [address]   CBC     Status: Abnormal   Collection Time: 11/29/17  7:25 AM  Result Value Ref Range   WBC 7.8 4.0 - 10.5 K/uL   RBC 3.20 (L) 4.22 - 5.81 MIL/uL   Hemoglobin 7.7 (L) 13.0 - 17.0 g/dL    Comment: REPEATED TO VERIFY   HCT 23.5 (L) 39.0 - 52.0 %   MCV 73.4 (L) 78.0 - 100.0 fL   MCH 24.1 (L) 26.0 - 34.0 pg   MCHC 32.8 30.0 - 36.0 g/dL   RDW 21.1 (H) 11.5 - 15.5 %   Platelets 231 150 - 400 K/uL    Comment: Performed at [facility], [place] [address] [place], [place] [address]  Renal function panel     Status: Abnormal   Collection Time: 11/29/17  7:55 AM  Result Value Ref Range   Sodium 133 (L) 135 - 145 mmol/L   Potassium 5.3 (H) 3.5 - 5.1 mmol/L   Chloride 92 (L) 101 - 111 mmol/L   CO2 27 22 - 32 mmol/L   Glucose, Bld 90 65 - 99 mg/dL   BUN 42 (H) 6 - 20 mg/dL   Creatinine, Ser 9.07 (H) 0.61 - 1.24 mg/dL   Calcium 8.5 (L) 8.9 - 10.3 mg/dL   Phosphorus 4.9 (H) 2.5 - 4.6 mg/dL   Albumin 2.3 (L) 3.5 - 5.0 g/dL   GFR calc non Af Amer 6 (L) >60 mL/min   GFR calc Af Amer 7 (L) >60 mL/min    Comment: (NOTE) The eGFR has been calculated using the CKD EPI equation. This calculation has not been validated in all clinical situations. eGFR's persistently <60 mL/min signify possible Chronic Kidney Disease.    Anion gap 14 5 - 15    Comment: Performed at [facility] [address] [place], [place] [address]    MICRO:i have reviewed micro datat IMAGING: Ir Paracentesis  Result Date: 11/28/2017 INDICATION:  Ascites EXAM: ULTRASOUND GUIDED  PARACENTESIS MEDICATIONS: None. COMPLICATIONS: None immediate. PROCEDURE: Informed written consent was obtained from the patient after a  discussion of the risks, benefits and alternatives to treatment. A timeout was performed prior to the initiation of the procedure. Initial ultrasound scanning demonstrates a large amount of ascites within the right lower abdominal quadrant. The right lower abdomen was prepped and draped in the usual sterile fashion. 1% lidocaine with epinephrine was used for local anesthesia. Following this, a 6 Fr Safe-T-Centesis catheter was introduced. An ultrasound image was saved for documentation purposes. The paracentesis was performed. The catheter was removed and a dressing was applied. The patient tolerated the procedure well without immediate post procedural complication. FINDINGS: A total of approximately 2.1 L of dark clear fluid was removed. Samples were sent to the laboratory as requested by the clinical team. IMPRESSION: Successful ultrasound-guided paracentesis yielding 2.1 liters of peritoneal fluid. Electronically Signed   By: Marybelle Killings M.D.   On: 11/28/2017 12:46     Assessment/Plan:  57yo M with Recurrent SBP/ culture negative neutrocytic ascites  Would repeat sending fluid for cytology Would send fluid for AFB culture  Would like to change abtx to cefotaxime -- but there is a Producer, television/film/video.will keep on ceftaz plus vancomycin x 10d course Repeat paracentesis in 3 days to see if any improvement in cell count Recommend to have GI see him formally to optimize with decompensated cirrhosis He would benefit from weekly cipro 728m after IV abtx for SBP prophylaxis  Chronic hep B Please get hep B viral load

## 2017-11-29 NOTE — Progress Notes (Addendum)
[facility] PROGRESS NOTE  [name] ERD:[id number] DOB: August 02, 1960 DOA: 11/26/2017  PCP: Joshua Mccreedy, MD  Brief History/Interval Summary: 57 y.o.malewith medical history significant ofalcohol abuse, alcoholic and viral liver cirrhosis with ascites, hypertension, COPD, GERD, anxiety, anemia of chronic disease, end-stage renal disease on hemodialysis (Monday Wednesday Friday) polysubstance abuse (tobacco, cocaine, marijuana), hepatitis B virus, hepatitis C virus, CAD, who presented with abdominal pain and pain in his right arm after having graft placed last week. He was recently hospitalized from 5/7 to 11/19/2017. Diagnoses at discharge was persistent spontaneous bacterial peritonitis complicated by sepsis. On May 4 he had left the hospital [place] but presented back to the emergency department on May 7 with worsening abdominal pain which was constant and severe in intensity. He was treated for persistent spontaneous bacterial peritonitis having left without completing his course of therapy.This is his 17th admission to the hospital in 12 months.Patient does frequently leave [facility].  Patient was hospitalized and placed on broad-spectrum antibiotics.  He underwent paracentesis.  Continues to have high WBC in peritoneal fluid.  Infectious disease also consulted.  Reason for Visit:   Abdominal pain with concern for spontaneous bacterial peritonitis  Consultants: Nephrology.  Vascular surgery.  Infectious disease  Procedures:  Paracentesis 5/20 Hemodialysis  Antibiotics: On vancomycin and ceftazidime   Subjective/Interval History: Patient states that his abdominal pain has improved.  Denies any nausea or vomiting.  ROS: Denies any chest pain or shortness of breath.  Objective:  Vital Signs  Vitals:   11/29/17 0930 11/29/17 1000 11/29/17 1030 11/29/17 1100  BP: (!) 161/82 (!) 152/81 (!) 144/70 (!) 156/70  Pulse: 78 80  83 82  Resp:      Temp:      TempSrc:      SpO2:      Weight:        Intake/Output Summary (Last 24 hours) at 11/29/2017 1142 Last data filed at 11/29/2017 0600 Gross per 24 hour  Intake 240 ml  Output 0 ml  Net 240 ml   Filed Weights   11/28/17 0500 11/28/17 2035 11/29/17 0715  Weight: 69.2 kg (152 lb 8.9 oz) 69.2 kg (152 lb 8.1 oz) 70.1 kg (154 lb 8.7 oz)    General appearance: alert, cooperative, appears stated age and no distress Head: Normocephalic, without obvious abnormality, atraumatic Resp: clear to auscultation bilaterally Cardio: regular rate and rhythm, S1, S2 normal, no murmur, click, rub or gallop GI: Abdomen is less distended today.  Less tender.  No masses.  Bowel sounds present. Extremities: extremities normal, atraumatic, no cyanosis or edema Neurologic: No Focal neurologic deficits.  Lab Results:  Data Reviewed: I have personally reviewed following labs and imaging studies  CBC: Recent Labs  Lab 11/26/17 2133 11/28/17 0455 11/29/17 0725  WBC 12.3* 10.5 7.8  HGB 7.9* 8.2* 7.7*  HCT 24.2* 25.2* 23.5*  MCV 73.1* 75.7* 73.4*  PLT 204 227 563    Basic Metabolic Panel: Recent Labs  Lab 11/26/17 2133 11/28/17 0455 11/29/17 0755  NA 132* 133* 133*  K 4.7 5.1 5.3*  CL 89* 91* 92*  CO2 28 23 27   GLUCOSE 130* 114* 90  BUN 26* 37* 42*  CREATININE 6.73* 8.26* 9.07*  CALCIUM 8.3* 8.5* 8.5*  PHOS  --   --  4.9*    GFR: Estimated Creatinine Clearance: 9.1 mL/min (A) (by C-G formula based on SCr of 9.07 mg/dL (H)).  Liver Function Tests: Recent Labs  Lab 11/26/17 2133 11/29/17 0755  AST 27  --   ALT 9*  --   ALKPHOS 213*  --   BILITOT 0.7  --   PROT 6.5  --   ALBUMIN 2.6* 2.3*    Recent Labs  Lab 11/26/17 2133  LIPASE 24   Recent Labs  Lab 11/27/17 0225  AMMONIA 23    Coagulation Profile: Recent Labs  Lab 11/27/17 0225  INR 1.30    CBG: Recent Labs  Lab 11/26/17 2344  GLUCAP 106*     Recent Results (from the past  240 hour(s))  Surgical pcr screen     Status: None   Collection Time: 11/21/17  5:06 PM  Result Value Ref Range Status   MRSA, PCR NEGATIVE NEGATIVE Final   Staphylococcus aureus NEGATIVE NEGATIVE Final    Comment: (NOTE) The Xpert SA Assay (FDA approved for NASAL specimens in patients 83 years of age and older), is one component of a comprehensive surveillance program. It is not intended to diagnose infection nor to guide or monitor treatment. Performed at [facility], [place] [address], [place], [place] [address]   Culture, blood (Routine X 2) w Reflex to ID Panel     Status: None (Preliminary result)   Collection Time: 11/27/17  2:25 AM  Result Value Ref Range Status   Specimen Description BLOOD LEFT FOREARM  Final   Special Requests   Final    BOTTLES DRAWN AEROBIC AND ANAEROBIC Blood Culture adequate volume   Culture   Final    NO GROWTH 1 DAY Performed at [facility], [place] [address] [place], [place] [address]    Report Status PENDING  Incomplete  Culture, blood (Routine X 2) w Reflex to ID Panel     Status: None (Preliminary result)   Collection Time: 11/27/17  5:00 AM  Result Value Ref Range Status   Specimen Description BLOOD LEFT FOREARM  Final   Special Requests   Final    BOTTLES DRAWN AEROBIC AND ANAEROBIC Blood Culture results may not be optimal due to an inadequate volume of blood received in culture bottles   Culture   Final    NO GROWTH 1 DAY Performed at [facility], [place] [address] [place], [place] [address]    Report Status PENDING  Incomplete  Gram stain     Status: None   Collection Time: 11/28/17  1:22 PM  Result Value Ref Range Status   Specimen Description PERITONEAL  Final   Special Requests NONE  Final   Gram Stain   Final    ABUNDANT WBC PRESENT,BOTH PMN AND MONONUCLEAR NO ORGANISMS SEEN Performed at [facility], [address] [place], [place] [address]    Report Status 11/28/2017 FINAL  Final      Radiology  Studies: Ir Paracentesis  Result Date: 11/28/2017 INDICATION: Ascites EXAM: ULTRASOUND GUIDED  PARACENTESIS MEDICATIONS: None. COMPLICATIONS: None immediate. PROCEDURE: Informed written consent was obtained from the patient after a discussion of the risks, benefits and alternatives to treatment. A timeout was performed prior to the initiation of the procedure. Initial ultrasound scanning demonstrates a large amount of ascites within the right lower abdominal quadrant. The right lower abdomen was prepped and draped in the usual sterile fashion. 1% lidocaine with epinephrine was used for local anesthesia. Following this, a 6 Fr Safe-T-Centesis catheter was introduced. An ultrasound image was saved for documentation purposes. The paracentesis was performed. The catheter was removed and a dressing was applied. The patient tolerated the  procedure well without immediate post procedural complication. FINDINGS: A total of approximately 2.1 L of dark clear fluid was removed. Samples were sent to the laboratory as requested by the clinical team. IMPRESSION: Successful ultrasound-guided paracentesis yielding 2.1 liters of peritoneal fluid. Electronically Signed   By: Marybelle Killings M.D.   On: 11/28/2017 12:46     Medications:  Scheduled: . clopidogrel  75 mg Oral Daily  . diltiazem  120 mg Oral Daily  . enoxaparin (LOVENOX) injection  30 mg Subcutaneous Q24H  . famotidine  20 mg Oral QHS  . feeding supplement (NEPRO CARB STEADY)  237 mL Oral BID BM  . fluticasone  2 spray Each Nare Daily  . gabapentin  100 mg Oral BID  . isosorbide mononitrate  30 mg Oral Daily  . lactulose  20 g Oral BID  . loratadine  10 mg Oral Daily  . mometasone-formoterol  2 puff Inhalation BID  . nicotine  14 mg Transdermal Daily  . pantoprazole  40 mg Oral Daily  . sevelamer carbonate  1,600 mg Oral With snacks  . sevelamer carbonate  3,200 mg Oral TID WC  . tiotropium  18 mcg Inhalation Daily   Continuous: . sodium chloride      . sodium chloride    . cefTAZidime (FORTAZ)  IV    . cefTAZidime (FORTAZ)  IV    . vancomycin    . vancomycin     VZD:GLOVFI chloride, sodium chloride, albuterol, alteplase, heparin, hydrALAZINE, ibuprofen, ketorolac, lidocaine (PF), lidocaine, lidocaine-prilocaine, morphine injection, ondansetron **OR** ondansetron (ZOFRAN) IV, oxyCODONE, pentafluoroprop-tetrafluoroeth, polyethylene glycol  Assessment/Plan:   Abdominal pain thought to be secondary to spontaneous bacterial peritonitis He underwent paracentesis on 5/20 and 2.1 L were removed.  Sent to the lab.  WBC in the fluid is 1105 with 82% neutrophils.  Gram stain without any organisms. The patient was empirically started on vancomycin and ceftazidime which will be continued. No positive cultures identifiedin the last few months despite similar presentation. ?However peritoneal fluid analysis done previously has also shown elevated WBC suggestive of infection.  Discussed with nephrology today.  Will consult infectious disease to assist with management.  Erythema over the graft site Seen by vascular surgery. ?Thought to be reaction to gore-tex rather than an infection. ?Continue to monitor.  End-stage renal disease on hemodialysis on Monday Wednesday Friday Nephrology is following.  Could not be dialyzed yesterday due to many cases.  Being dialyzed this morning.  Potassium noted to be elevated.     History of liver cirrhosis with hepatitis B and C/history?of?hepatic encephalopathy Noted to have ascites on CT scan. ?Patient takes lactulose at home which is being continued. ?Soft tissue density noted in the pelvic area on the CT scan previously identified as chronic peritoneal thickening. ?Significance unclear.  May need continued monitoring or consider doing alternative imaging studies such as MRI.  Anemia of chronic disease Mild drop in hemoglobin noted but still close to baseline.  Continue to monitor.  Malnutrition of moderate  degree Encourage oral intake.  History of alcohol abuse States that he has not been drinking recently. ?Continue to monitor.  Mitral stenosis Currently asymptomatic.  DVT Prophylaxis:?Lovenox??? Code Status:?Full code Family Communication:?Discussed with the patient Disposition Plan:?Management as outlined above. ?Will likely go home when medically improved.   ?     LOS: 2 days   [facility] Pager [phone] 11/29/2017, 11:42 AM  If 7PM-7AM, please contact night-coverage at [email or web address], password [facility]

## 2017-11-29 NOTE — Progress Notes (Signed)
Subjective:  Tolerated HD earlier , now denies abd pain , wants to go home   Objective Vital signs in last 24 hours: Vitals:   11/29/17 1000 11/29/17 1030 11/29/17 1100 11/29/17 1125  BP: (!) 152/81 (!) 144/70 (!) 156/70 (!) 155/71  Pulse: 80 83 82 73  Resp:    18  Temp:    97.8 F (36.6 C)  TempSrc:    Oral  SpO2:    98%  Weight:    67 kg (147 lb 11.3 oz)   Weight change: -0.023 kg (-0.8 oz)  Physical Exam General:thin chronically ill male, NAD  Lungs:CTA  bilat  Heart:RRR no m, r, g  Abdomen:distended + ascites, nontender / + bs  Lower extremities:withoutovertedema or ischemic changes, no open wounds  Dialysis Access:right IJ TDC and right upper AVGG mildly tender+ bruit  Dialysis Orders:[facility] M[facility] 4 hr 400/800 EDW 69 2K 2 Ca TDCand right upper AVGGno heparin parsabiv 5 / mircera 100  q 2wks  Last on  calcitriol 0.5  Isolation HD due to Hep B +   Problem/Plan: 1.  SBP - admit team wu  Noted will consult ID for further  RX  feels better sp 2.1 paracentesis 5/20  /   cbc =Wbc 10.5 >7.5 /  had been on Fortaz at outpatient HD on 13th only / paracentesis  Fluid  WBC  1105 with 82% Neutrophils/ Grm stain neg / now on Vancomycin / Ceftazidime  2. ESRD- MWF - HD today - needs isolation due to Hep B status -  K 5.3 3. Hypertension/volume- BP up on admit / better with med's and   sp paracent - . Hd  Today /needs some volume off but difficult to tell true EDW due to ascites - 4. Anemia- hgb low 7.9 >8.2 -  Due   esa need to compare with outpt labs/determine ESA redose schedule 5. Metabolic bone disease- Continue calcitriol - / renvela binder  6. Hx cirrhosis/chronic ascites/chronic lactulose/Hep B and C 7. Hx polusubstance abuse 8. Hx medical noncompliance- 9. New right upper AVGG with erythema, tenderness- appears more like gortex reaction than infection - seen by [facility]  10. Poor social support - 68 th admit in 91 months    Ernest Haber, PA-C [place] [phone] 11/29/2017,1:43 PM  LOS: 2 days   Labs: Basic Metabolic Panel: Recent Labs  Lab 11/26/17 2133 11/28/17 0455 11/29/17 0755  NA 132* 133* 133*  K 4.7 5.1 5.3*  CL 89* 91* 92*  CO2 28 23 27   GLUCOSE 130* 114* 90  BUN 26* 37* 42*  CREATININE 6.73* 8.26* 9.07*  CALCIUM 8.3* 8.5* 8.5*  PHOS  --   --  4.9*   Liver Function Tests: Recent Labs  Lab 11/26/17 2133 11/29/17 0755  AST 27  --   ALT 9*  --   ALKPHOS 213*  --   BILITOT 0.7  --   PROT 6.5  --   ALBUMIN 2.6* 2.3*   Recent Labs  Lab 11/26/17 2133  LIPASE 24   Recent Labs  Lab 11/27/17 0225  AMMONIA 23   CBC: Recent Labs  Lab 11/26/17 2133 11/28/17 0455 11/29/17 0725  WBC 12.3* 10.5 7.8  HGB 7.9* 8.2* 7.7*  HCT 24.2* 25.2* 23.5*  MCV 73.1* 75.7* 73.4*  PLT 204 227 231   Cardiac Enzymes: No results for input(s): CKTOTAL, CKMB, CKMBINDEX, TROPONINI in the last 168 hours. CBG: Recent Labs  Lab 11/26/17 2344  GLUCAP 106*  Medications: . cefTAZidime (FORTAZ)  IV    . cefTAZidime (FORTAZ)  IV    . vancomycin     . clopidogrel  75 mg Oral Daily  . diltiazem  120 mg Oral Daily  . enoxaparin (LOVENOX) injection  30 mg Subcutaneous Q24H  . famotidine  20 mg Oral QHS  . feeding supplement (NEPRO CARB STEADY)  237 mL Oral BID BM  . fluticasone  2 spray Each Nare Daily  . gabapentin  100 mg Oral BID  . isosorbide mononitrate  30 mg Oral Daily  . lactulose  20 g Oral BID  . loratadine  10 mg Oral Daily  . mometasone-formoterol  2 puff Inhalation BID  . nicotine  14 mg Transdermal Daily  . pantoprazole  40 mg Oral Daily  . sevelamer carbonate  1,600 mg Oral With snacks  . sevelamer carbonate  3,200 mg Oral TID WC  . tiotropium  18 mcg Inhalation Daily

## 2017-11-29 NOTE — Care Management Note (Addendum)
Case Management Note  Patient Details  Name: Sherrod Toothman MRN: [id number] Date of Birth: Jan 22, 1961  Subjective/Objective:  History of alcohol abuse, alcoholic/viral liver cirrhosis with ascites, hypertension, COPD, GERD, anxiety, anemiaof chronicdisease, ESRD on hemodialysis (M,W,F) polysubstance abuse (tobacco, cocaine, marijuana), hepatitis B virus, hepatitis C virus, CAD.  Admitted for Abdominal pain thought to be secondary to spontaneous bacterial peritonitis.         Action/Plan: PCP noted.  Prior to admission patient lived at home with family.  At discharge patient plans to return to same living situation.  NCM will continue to follow for discharge transition needs.  Expected Discharge Date:    To Be Determined.              Expected Discharge Plan:  Home/Self Care  In-House Referral:   N/A  Discharge planning Services  CM Consult  Status of Service:  In process, will continue to follow  Kristen Cardinal, RN 11/29/2017, 2:29 PM

## 2017-11-29 NOTE — Consult Note (Addendum)
[facility] Consult: 9:26 AM 11/30/2017  LOS: 3 days    Referring Provider: Dr Curly Rim Primary Care Physician:  [name], MD Primary Gastroenterologist:  Dr. Hilarie Fredrickson  Reason for Consultation: Management of SBP.   HPI: [name] is a 57 y.o. male.  Hx COPD.  ESRD on HD.   Anemia requiring periodic transfusions dating to 05/2017.  Not taking Plavix for CAD and 02/2017 cardiac PCI and DES stenting, rec was for at least 6 months.    Cirrhosis of the liver.  History hep C, completed treatment.  Chronic active hepatitis B.  Ascites.    09/2012 Colonoscopy.  Average risk screening first colonoscopy a total of 7 sessile polyps removed ranging in size from 3 to 5 mm, small internal hemorrhoids.  Pathology: both hyperplastic and adenomatous. 11/11/16 Liver elastography with F3/F4 fibrosis.   11/2016 EGD. Variceal screening.  Normal esophagus, no varices. Scattered moderate inflammation/congestion/edema with erosions and erythema throughout the stomach, especially in the antrum.  Biopsies obtained.  Normal duodenum. Pathology showed mild chronic gastritis with erosions.  No H. pylori, no intestinal metaplasia/dysplasia/malignancy. 07/16/2017 flexible sigmoidoscopy for evaluation of hematochezia.  Multiple rectal ulcers consistent with solitary rectal ulcer syndrome.  Endoclips placed.  Transfused 2 U PRBCs ~ 06/19/2017.    Transfused 4 U PRBCs during admission in early 07/2017. Transfused 2 U PRBCs ~ 09/02/2017  Chronic abdominal pain, pt ends up in ED when swelling increases and abdominal pain gets worse and undergoes paracentesis that partially relieves pain.  He does not call a MD to try to arrange outpt paracentesis.  Anorexia, no nausea.  No rectal bleeding.  He has not drunk alcohol regularly for  several years.  Several months back he had a little bit of wine with his son. 08/30/2017 paracentesis.  600 mL's removed 09/29/17 paracentesis.  1.6 L removed 10/14/17 paracentesis.  3.7 L bloody peritoneal fluid removed.  WBCs 1657 10/28/2017 paracentesis.  5 L removed. WBCs 4900. 11/09/2017 paracentesis.  3 L removed.  WBCs 2100 11/16/2017 paracentesis.  2.1 L removed.  WBCs 2075 11/28/2017 paracentesis.  2.1 L of dark clear fluid removed.  WBCs 1105. All peritoneal fluid cultures have been negative with high neutrophil count thus far.  Treated with [name] for 4 days in early May, treatment discontinued since the cultures were negative.   He does not appear to be on any SBP prophylaxis.  Since admission on vancomycin and ceftaz switched to 10 d Cefotaxime/Vanc per ID rec. For SBP prevention ID recommends weekly Cipro 750 mg po after IV abx completed.    11/16/17 CT scan abdomen pelvis showed a large amount of freely distributed ascites,  Cirrhosis of the liver.  Cardiomegaly with advanced aortic and branch vessel atherosclerosis. 11/27/2017 CT abdomen pelvis.  Again showing large abdominopelvic ascites some soft tissue density again noted layering dependently in the pelvis.  Cirrhosis.  Bilateral, chronic renal atrophy.   Past Medical History:  Diagnosis Date  . Anemia   . Anxiety   . Arthritis    left shoulder  . Atherosclerosis of  aorta ([place])   . Cardiomegaly   . Chest pain    DATE UNKNOWN, C/O PERIODICALLY  . Cocaine abuse ([place])   . COPD exacerbation ([place]) 08/17/2016  . Coronary artery disease    stent 02/22/17  . ESRD (end stage renal disease) on dialysis ([place])    "[name]; MWF" (07/04/2017)  . GERD (gastroesophageal reflux disease)    DATE UNKNOWN  . Hemorrhoids   . Hepatitis B, chronic ([place])   . Hepatitis C   . History of kidney stones   . Hyperkalemia   . Hypertension   . Metabolic bone disease    Patient denies  . Mitral stenosis   . Myocardial infarction ([place])   . Pneumonia    . Pulmonary edema   . Solitary rectal ulcer syndrome 07/2017   at flex sig for rectal bleeding  . Tubular adenoma of colon     Past Surgical History:  Procedure Laterality Date  . A/V FISTULAGRAM Left 05/26/2017   Procedure: A/V FISTULAGRAM;  Surgeon: Conrad Fairhaven, MD;  Location: [place] [facility];  Service: Cardiovascular;  Laterality: Left;  . A/V FISTULAGRAM Right 11/18/2017   Procedure: A/V FISTULAGRAM - Right Arm;  Surgeon: Elam Dutch, MD;  Location: [place] [facility];  Service: Cardiovascular;  Laterality: Right;  . APPLICATION OF WOUND VAC Left 06/14/2017   Procedure: APPLICATION OF WOUND VAC;  Surgeon: Katha Cabal, MD;  Location: [facility];  Service: Vascular;  Laterality: Left;  . AV FISTULA PLACEMENT  2012   BELIEVED WAS PLACED IN JUNE  . AV FISTULA PLACEMENT Right 08/09/2017   Procedure: Creation Right arm ARTERIOVENOUS BRACHIOCEPOHALIC FISTULA;  Surgeon: Elam Dutch, MD;  Location: [facility];  Service: Vascular;  Laterality: Right;  . AV FISTULA PLACEMENT Right 11/22/2017   Procedure: INSERTION OF ARTERIOVENOUS (AV) GORE-TEX GRAFT RIGHT UPPER ARM;  Surgeon: Elam Dutch, MD;  Location: [place];  Service: Vascular;  Laterality: Right;  . COLONOSCOPY    . CORONARY STENT INTERVENTION N/A 02/22/2017   Procedure: CORONARY STENT INTERVENTION;  Surgeon: Nigel Mormon, MD;  Location: [place] [facility];  Service: Cardiovascular;  Laterality: N/A;  . FLEXIBLE SIGMOIDOSCOPY N/A 07/15/2017   Procedure: FLEXIBLE SIGMOIDOSCOPY;  Surgeon: Carol Ada, MD;  Location: [place];  Service: Endoscopy;  Laterality: N/A;  . HEMORRHOID BANDING    . I&D EXTREMITY Left 06/01/2017   Procedure: IRRIGATION AND DEBRIDEMENT LEFT ARM HEMATOMA WITH LIGATION OF LEFT ARM AV FISTULA;  Surgeon: Elam Dutch, MD;  Location: [place];  Service: Vascular;  Laterality: Left;  . I&D EXTREMITY Left 06/14/2017   Procedure: IRRIGATION AND DEBRIDEMENT EXTREMITY;  Surgeon: Katha Cabal, MD;  Location: [facility];  Service: Vascular;  Laterality: Left;  . INSERTION OF DIALYSIS CATHETER  05/30/2017  . INSERTION OF DIALYSIS CATHETER N/A 05/30/2017   Procedure: INSERTION OF DIALYSIS CATHETER;  Surgeon: Elam Dutch, MD;  Location: [place];  Service: Vascular;  Laterality: N/A;  . IR PARACENTESIS  08/30/2017  . IR PARACENTESIS  09/29/2017  . IR PARACENTESIS  10/28/2017  . IR PARACENTESIS  11/09/2017  . IR PARACENTESIS  11/16/2017  . IR PARACENTESIS  11/28/2017  . LEFT HEART CATH AND CORONARY ANGIOGRAPHY N/A 02/22/2017   Procedure: LEFT HEART CATH AND CORONARY ANGIOGRAPHY;  Surgeon: Nigel Mormon, MD;  Location: [place] [facility];  Service: Cardiovascular;  Laterality: N/A;  . LIGATION OF ARTERIOVENOUS  FISTULA Left 12/13/158   Procedure: Plication of Left Arm Arteriovenous  Fistula;  Surgeon: Elam Dutch, MD;  Location: [place];  Service: Vascular;  Laterality: Left;  . POLYPECTOMY    . REVISON OF ARTERIOVENOUS FISTULA Left 6/57/8469   Procedure: PLICATION OF DISTAL ANEURYSMAL SEGEMENT OF LEFT UPPER ARM ARTERIOVENOUS FISTULA;  Surgeon: Elam Dutch, MD;  Location: [place];  Service: Vascular;  Laterality: Left;  . REVISON OF ARTERIOVENOUS FISTULA Left 01/08/5283   Procedure: Plication of Left Upper Arm Fistula ;  Surgeon: Waynetta Sandy, MD;  Location: [place];  Service: Vascular;  Laterality: Left;  . SKIN GRAFT SPLIT THICKNESS LEG / FOOT Left    SKIN GRAFT SPLIT THICKNESS LEFT ARM DONOR SITE: LEFT ANTERIOR THIGH  . SKIN SPLIT GRAFT Left 07/04/2017   Procedure: SKIN GRAFT SPLIT THICKNESS LEFT ARM DONOR SITE: LEFT ANTERIOR THIGH;  Surgeon: Elam Dutch, MD;  Location: [place];  Service: Vascular;  Laterality: Left;  . THROMBECTOMY W/ EMBOLECTOMY Left 06/05/2017   Procedure: EXPLORATION OF LEFT ARM FOR BLEEDING; OVERSEWED PROXIMAL FISTULA;  Surgeon: Angelia Mould, MD;  Location: [place];  Service: Vascular;  Laterality: Left;  . WOUND EXPLORATION Left  06/03/2017   Procedure: WOUND EXPLORATION WITH WOUND VAC APPLICATION TO LEFT ARM;  Surgeon: Angelia Mould, MD;  Location: [place];  Service: Vascular;  Laterality: Left;    Prior to Admission medications   Medication Sig Start Date End Date Taking? Authorizing Provider  albuterol (VENTOLIN HFA) 108 (90 Base) MCG/ACT inhaler Inhale 2 puffs into the lungs every 6 (six) hours as needed for wheezing or shortness of breath.   Yes [provider]  budesonide-formoterol (SYMBICORT) 80-4.5 MCG/ACT inhaler Inhale 2 puffs into the lungs 2 (two) times daily. 10/13/17  Yes Tanda Rockers, MD  Ceftazidime (FORTAZ) 2 g SOLR injection Inject 2 g into the vein every other day. To receive on hemodialysis, last dose 11/23/2017. 11/19/17  Yes Arrien, Jimmy Picket, MD  clopidogrel (PLAVIX) 75 MG tablet Take 1 tablet (75 mg total) by mouth daily. 07/15/17  Yes Rhyne, Hulen Shouts, PA-C  diltiazem (CARDIZEM CD) 120 MG 24 hr capsule Take 1 capsule (120 mg total) by mouth daily. 06/23/17 06/23/18 Yes Shahmehdi, Valeria Batman, MD  famotidine (PEPCID) 20 MG tablet One at bedtime Patient taking differently: Take 20 mg by mouth at bedtime.  09/08/17  Yes Tanda Rockers, MD  fluticasone (FLONASE) 50 MCG/ACT nasal spray Place 2 sprays into both nostrils daily. 10/31/17  Yes Eugenie Filler, MD  gabapentin (NEURONTIN) 100 MG capsule Take 1 capsule (100 mg total) by mouth 2 (two) times daily. 11/22/17  Yes Rhyne, Samantha J, PA-C  hydrALAZINE (APRESOLINE) 100 MG tablet Take 1 tablet by mouth twice a day take after HD on dialysis days 08/31/17  Yes [provider]  isosorbide mononitrate (IMDUR) 30 MG 24 hr tablet Take 1 tablet (30 mg total) by mouth daily. Patient taking differently: Take 30 mg by mouth daily. ER 06/17/17  Yes [name], MD  lactulose [facility]) 10 GM/15ML solution Take 30 mLs (20 g total) by mouth 2 (two) times daily. 11/19/17 12/19/17 Yes Arrien, Jimmy Picket, MD  loratadine (CLARITIN) 10 MG  tablet Take 1 tablet (10 mg total) by mouth daily. 10/31/17  Yes Eugenie Filler, MD  Nutritional Supplements (FEEDING SUPPLEMENT, NEPRO CARB STEADY,) LIQD Take 237 mLs by mouth 2 (two) times daily between meals. 09/30/17  Yes Mariel Aloe, MD  Oxycodone HCl 10 MG TABS Take 1 tablet (10 mg total) by mouth every 6 (  six) hours as needed. 11/22/17  Yes Rhyne, Samantha J, PA-C  pantoprazole (PROTONIX) 40 MG tablet Take 1 tablet (40 mg total) by mouth daily. Take 30-60 min before first meal of the day 09/08/17  Yes Tanda Rockers, MD  sevelamer carbonate (RENVELA) 800 MG tablet Take 1,600-3,200 mg by mouth See admin instructions. Take 4 tablets (800mg  each) three times daily and 2 tablets twice daily with a snack. 10/31/17  Yes [provider]  tiotropium (SPIRIVA) 18 MCG inhalation capsule Place 18 mcg into inhaler and inhale daily.   Yes [provider]    Scheduled Meds: . calcitRIOL  0.5 mcg Oral Q M,W,F-HD  . clopidogrel  75 mg Oral Daily  . darbepoetin (ARANESP) injection - DIALYSIS  100 mcg Intravenous Q Wed-HD  . diltiazem  120 mg Oral Daily  . enoxaparin (LOVENOX) injection  30 mg Subcutaneous Q24H  . famotidine  20 mg Oral QHS  . feeding supplement (NEPRO CARB STEADY)  237 mL Oral BID BM  . fluticasone  2 spray Each Nare Daily  . gabapentin  100 mg Oral BID  . isosorbide mononitrate  30 mg Oral Daily  . lactulose  20 g Oral BID  . loratadine  10 mg Oral Daily  . mometasone-formoterol  2 puff Inhalation BID  . nicotine  14 mg Transdermal Daily  . pantoprazole  40 mg Oral Daily  . sevelamer carbonate  1,600 mg Oral With snacks  . sevelamer carbonate  3,200 mg Oral TID WC  . tiotropium  18 mcg Inhalation Daily   Infusions: . cefTAZidime (FORTAZ)  IV    . vancomycin     PRN Meds: albuterol, hydrALAZINE, ibuprofen, ketorolac, lidocaine, morphine injection, ondansetron **OR** ondansetron (ZOFRAN) IV, oxyCODONE, polyethylene glycol   Allergies as of 11/26/2017 -  Review Complete 11/26/2017  Allergen Reaction Noted  . Aspirin Other (See Comments) 02/27/2014  . Clonidine derivatives Itching 08/05/2013  . Tramadol Itching 02/25/2011  . Tylenol [acetaminophen] Nausea Only 10/27/2017    Family History  Problem Relation Age of Onset  . Heart disease Mother   . Lung cancer Mother   . Heart disease Father   . Malignant hyperthermia Father   . COPD Father   . Throat cancer Sister   . Esophageal cancer Sister   . Hypertension Other   . COPD Other   . Colon cancer Neg Hx   . Colon polyps Neg Hx   . Rectal cancer Neg Hx   . Stomach cancer Neg Hx     Social History   Socioeconomic History  . Marital status: Single    Spouse name: Not on file  . Number of children: 3  . Years of education: 10  . Highest education level: Not on file  Occupational History  . Occupation: Unemployed  Social Needs  . Financial resource strain: Not on file  . Food insecurity:    Worry: Not on file    Inability: Not on file  . Transportation needs:    Medical: Not on file    Non-medical: Not on file  Tobacco Use  . Smoking status: Current Every Day Smoker    Packs/day: 0.50    Years: 43.00    Pack years: 21.50    Types: Cigarettes    Start date: 08/13/1973  . Smokeless tobacco: Never Used  Substance and Sexual Activity  . Alcohol use: Not Currently    Frequency: Never    Comment: quit drinking in 2017  . Drug use: Not  Currently    Comment: quit in 2017"  . Sexual activity: Not on file    Comment: "NOT LATELY" on cocaine  Lifestyle  . Physical activity:    Days per week: Not on file    Minutes per session: Not on file  . Stress: Not on file  Relationships  . Social connections:    Talks on phone: Not on file    Gets together: Not on file    Attends religious service: Not on file    Active member of club or organization: Not on file    Attends meetings of clubs or organizations: Not on file    Relationship status: Not on file  . Intimate partner  violence:    Fear of current or ex partner: Not on file    Emotionally abused: Not on file    Physically abused: Not on file    Forced sexual activity: Not on file  Other Topics Concern  . Not on file  Social History Narrative   Lives alone   Caffeine use: Coffee-rare   Soda- daily      [facility] (03/10/17):   Originally from [facility]. Previously worked trimming trees. No pets currently. No bird or mold exposure.     REVIEW OF SYSTEMS: Constitutional: Feels kind of weak in general. ENT:  No nose bleeds Pulm: No dyspnea.  No cough. CV:  No palpitations, no LE edema.  No chest pain GU: Anuric. GI: No dysphagia.  No heartburn. Heme: Patient stopped taking Plavix because he chews his nails incessantly to the point where he is chewing on the tips of his fingers.  With Plavix these would bleed a lot.  Without Plavix his fingers bleed less. Transfusions:  Per HPI Neuro:  No headaches, no peripheral tingling or numbness Derm: Nervous nailbiting. Endocrine:  No sweats or chills.  Immunization: Not queried. Travel:  None beyond local counties in last few months.    PHYSICAL EXAM: Vital signs in last 24 hours: Vitals:   11/30/17 0549 11/30/17 0840  BP: (!) 150/84   Pulse: 86 90  Resp: 20 16  Temp:    SpO2: 98% 95%   Wt Readings from Last 3 Encounters:  11/30/17 148 lb 2.4 oz (67.2 kg)  11/19/17 151 lb 0.2 oz (68.5 kg)  11/12/17 167 lb 1.7 oz (75.8 kg)    General: Malnourished, thin, chronically ill looking AAM Head: No facial asymmetry or swelling.  No signs of head trauma. Eyes: No scleral icterus, no conjunctival pallor. Ears: Not hard of hearing. Nose: No discharge or congestion Mouth: Oropharynx with few remaining teeth.  Oral mucosa pink, moist, clear.  Tongue midline. Neck: No JVD, no masses, no thyromegaly. Lungs: No labored breathing or cough.  Lungs clear bilaterally. Heart: RRR.  No MRG.  S1, S2 present. Abdomen: Soft, distended.  Tender in the mid abdomen  without guarding or rebound.  Bowel sounds present but hypoactive.  No tinkling or tympanitic quality.. Rectal: Deferred Musc/Skeltl: No joint redness or swelling.  Decreased muscle mass. Extremities: No CCE. Neurologic: Alert.  Oriented x3.  Moves all 4 limbs.  No tremor, no asterixis.  No gross neurologic deficits. Skin: Patient's nails are deformed from his chewing.  A few of them are covered with gauze bandage. Nodes: No cervical adenopathy. Psych: Calm, cooperative, pleasant.  Intake/Output from previous day: 05/21 0701 - 05/22 0700 In: 220 [P.O.:120; IV Piggyback:100] Out: 3001  Intake/Output this shift: No intake/output data recorded.  LAB RESULTS: Recent Labs  11/28/17 0455 11/29/17 0725  WBC 10.5 7.8  HGB 8.2* 7.7*  HCT 25.2* 23.5*  PLT 227 231   BMET Lab Results  Component Value Date   NA 133 (L) 11/29/2017   NA 133 (L) 11/28/2017   NA 132 (L) 11/26/2017   K 5.3 (H) 11/29/2017   K 5.1 11/28/2017   K 4.7 11/26/2017   CL 92 (L) 11/29/2017   CL 91 (L) 11/28/2017   CL 89 (L) 11/26/2017   CO2 27 11/29/2017   CO2 23 11/28/2017   CO2 28 11/26/2017   GLUCOSE 90 11/29/2017   GLUCOSE 114 (H) 11/28/2017   GLUCOSE 130 (H) 11/26/2017   BUN 42 (H) 11/29/2017   BUN 37 (H) 11/28/2017   BUN 26 (H) 11/26/2017   CREATININE 9.07 (H) 11/29/2017   CREATININE 8.26 (H) 11/28/2017   CREATININE 6.73 (H) 11/26/2017   CALCIUM 8.5 (L) 11/29/2017   CALCIUM 8.5 (L) 11/28/2017   CALCIUM 8.3 (L) 11/26/2017   LFT Recent Labs    11/29/17 0755  ALBUMIN 2.3*   PT/INR Lab Results  Component Value Date   INR 1.30 11/27/2017   INR 1.26 11/21/2017   INR 1.21 11/15/2017   Hepatitis Panel No results for input(s): HEPBSAG, HCVAB, HEPAIGM, HEPBIGM in the last 72 hours. C-Diff No components found for: CDIFF Lipase     Component Value Date/Time   LIPASE 24 11/26/2017 2133    Drugs of Abuse     Component Value Date/Time   LABOPIA NONE DETECTED 08/17/2016 0920    COCAINSCRNUR NONE DETECTED 08/17/2016 0920   LABBENZ NONE DETECTED 08/17/2016 0920   AMPHETMU NONE DETECTED 08/17/2016 0920   THCU NONE DETECTED 08/17/2016 0920   LABBARB NONE DETECTED 08/17/2016 0920     RADIOLOGY STUDIES: Ir Paracentesis  Result Date: 11/28/2017 INDICATION: Ascites EXAM: ULTRASOUND GUIDED  PARACENTESIS MEDICATIONS: None. COMPLICATIONS: None immediate. PROCEDURE: Informed written consent was obtained from the patient after a discussion of the risks, benefits and alternatives to treatment. A timeout was performed prior to the initiation of the procedure. Initial ultrasound scanning demonstrates a large amount of ascites within the right lower abdominal quadrant. The right lower abdomen was prepped and draped in the usual sterile fashion. 1% lidocaine with epinephrine was used for local anesthesia. Following this, a 6 Fr Safe-T-Centesis catheter was introduced. An ultrasound image was saved for documentation purposes. The paracentesis was performed. The catheter was removed and a dressing was applied. The patient tolerated the procedure well without immediate post procedural complication. FINDINGS: A total of approximately 2.1 L of dark clear fluid was removed. Samples were sent to the laboratory as requested by the clinical team. IMPRESSION: Successful ultrasound-guided paracentesis yielding 2.1 liters of peritoneal fluid. Electronically Signed   By: Marybelle Killings M.D.   On: 11/28/2017 12:46     IMPRESSION:   *   Cirrhosis of liver.  Eradicated Hep C, chronic active Hep B.  Ammonia 40.  INRO 1.3.  LFTS, except Albumin, normal.    *  Ascites.  Current mgt is with frequent paracentesis.  Diuretics not an option in pt with ESRD.  TIPS may be helpful in controlling ascites but at cost/high risk of hepatic encephalopathy.    *   SBP.  Abx mgt per ID.    *  Mild elevated ammonia, highest level 63 in 04/2017.  Takes Lactulose 1 to 2 x daily (does not like to take it before HD)  *    Chronic anemia.  Periodically requires PRBC  transfusion.    *   Variceal screening.  EGD 11/2016: gastritis, no portal htn changes or varices.    *   Rectal bleeding, rectal ulcers on 07/2017 Flex sig.  Hyperplastic and adenomatous polyps on screening colonoscopy 09/2012, due for repeat screening  *   ESRD.    *   CAD with drug-eluting stent placed 02/2017.  Has not been taking Plavix.   PLAN:     *  Follow SBP treatment and preventative abx guidelines per Dr Storm Frisk rec.   *  Continue Lactulose bid on non-HD days, daily after HD on dialysis days.    *   Needs to be in better contact with Dr. Pyrtle's/GI office so that he can have his paracentesis arranged as an outpatient rather than using the emergency room for management of his ascites. Has 6/4, 9:30 AM appt with [place] PA-C at [facility] GI office.  Note that on the last 3 or 4 schedule GI office visits he either canceled or did not show up for appointment.   Azucena Freed  11/30/2017, 9:26 AM Phone [phone]  Attending physician's note   I have taken an interval history, reviewed the chart and examined the patient. I agree with the Advanced Practitioner's note, impression and recommendations.   57 year old with alcoholic liver cirrhosis (h/O hep c -treated), with portal hypertension with ascites, encephalopathy and recurrent SBP with associated ESRD on HD (MWF), noncompliance admitted with ascites with SBP.  Stopped drinking alcohol 1 year ago.  Not a candidate for liver transplantation, TIPS due to coronary artery disease, end-stage renal disease. Plan: Discussed compliance with follow-ups, regular scheduled paracenteses, treat with cefotaxime x 10 days followed by prophylaxis with Cipro 750 mg p.o. once a day.  [place] to go home.  Have paged hospitalist service to discuss.   Carmell Austria, MD

## 2017-11-29 NOTE — Progress Notes (Signed)
Pharmacy Antibiotic Note  [name] is a 57 y.o. male admitted on 11/26/2017 with abdominal pain.  Pharmacy has been consulted for vancomycin and ceftazidime dosing for bacterial peritonitis.   Patient has ESRD on MWF HD- currently in HD off schedule d/t scheduling issues yesterday. Tolerating BFR 482mL/min.   Plan: Vancomycin 750mg  IV x1 on 5/21 off schedule, then resume qHD MWF Ceftaz 2g IV x1 on 5/21 @ 2000, then resume MWF @ 2000 Monitor HD schedule/tolerance, clinical progress, vanc level as indicated  Weight: 154 lb 8.7 oz (70.1 kg)  Temp (24hrs), Avg:98.5 F (36.9 C), Min:97.9 F (36.6 C), Max:99.3 F (37.4 C)  Recent Labs  Lab 11/26/17 2133 11/26/17 2140 11/26/17 2327 11/28/17 0455 11/29/17 0725 11/29/17 0755  WBC 12.3*  --   --  10.5 7.8  --   CREATININE 6.73*  --   --  8.26*  --  9.07*  LATICACIDVEN  --  1.56 1.01  --   --   --     Estimated Creatinine Clearance: 9.1 mL/min (A) (by C-G formula based on SCr of 9.07 mg/dL (H)).    Allergies  Allergen Reactions  . Aspirin Other (See Comments)    STOMACH PAIN  . Clonidine Derivatives Itching  . Tramadol Itching  . Tylenol [Acetaminophen] Nausea Only    Stomach ache   Ceftriaxone 5/19 x1 Vancomycin 5/19 >> [name] 5/19 >>  5/19 BCx - ngtd 5/20 peritoneal fluid - ngtd  [name] D. Brandelyn Henne, PharmD, BCPS Clinical Pharmacist [phone] 11/29/2017 11:10 AM

## 2017-11-29 NOTE — Progress Notes (Addendum)
Pharmacy Antibiotic Note  [name] is a 57 y.o. male admitted on 11/26/2017 with abdominal pain.  Pharmacy has been consulted for vancomycin and ceftazidime dosing for bacterial peritonitis. ID following and plan to continue vancomycin and fortaz for 10 days  Patient has ESRD. He is s/p HD on 5/21 and short HD session planned on 5/22 then MWF   Plan: -Ceftaz 2gm IV with HD MWF  -Vancomycin 750mg  IV MWF with HD -Monitor HD schedule/tolerance, clinical progress, vanc level as indicated  Weight: 147 lb 11.3 oz (67 kg)  Temp (24hrs), Avg:98.2 F (36.8 C), Min:97.8 F (36.6 C), Max:98.6 F (37 C)  Recent Labs  Lab 11/26/17 2133 11/26/17 2140 11/26/17 2327 11/28/17 0455 11/29/17 0725 11/29/17 0755  WBC 12.3*  --   --  10.5 7.8  --   CREATININE 6.73*  --   --  8.26*  --  9.07*  LATICACIDVEN  --  1.56 1.01  --   --   --     Estimated Creatinine Clearance: 8.7 mL/min (A) (by C-G formula based on SCr of 9.07 mg/dL (H)).    Allergies  Allergen Reactions  . Aspirin Other (See Comments)    STOMACH PAIN  . Clonidine Derivatives Itching  . Tramadol Itching  . Tylenol [Acetaminophen] Nausea Only    Stomach ache   Ceftriaxone 5/19 x1 Vancomycin 5/19 >> [name] 5/19 >>  5/19 BCx - ngtd 5/20 peritoneal fluid - ngtd  [name], PharmD Clinical Pharmacist 11/29/2017 9:50 PM

## 2017-11-30 ENCOUNTER — Encounter (HOSPITAL_COMMUNITY): Payer: Self-pay | Admitting: Physician Assistant

## 2017-11-30 DIAGNOSIS — F419 Anxiety disorder, unspecified: Secondary | ICD-10-CM

## 2017-11-30 DIAGNOSIS — F1010 Alcohol abuse, uncomplicated: Secondary | ICD-10-CM

## 2017-11-30 DIAGNOSIS — D631 Anemia in chronic kidney disease: Secondary | ICD-10-CM

## 2017-11-30 DIAGNOSIS — K766 Portal hypertension: Secondary | ICD-10-CM

## 2017-11-30 LAB — LIPASE, FLUID: Lipase-Fluid: 7 U/L

## 2017-11-30 MED ORDER — VANCOMYCIN HCL IN DEXTROSE 750-5 MG/150ML-% IV SOLN: 750.0000 mg | INTRAVENOUS | Status: DC

## 2017-11-30 MED ORDER — VANCOMYCIN HCL 500 MG IV SOLR
500.0000 mg | INTRAVENOUS | Status: DC
  Filled 2017-11-30: qty 500

## 2017-11-30 MED ORDER — ALPRAZOLAM 0.25 MG PO TABS
0.2500 mg | ORAL_TABLET | Freq: Two times a day (BID) | ORAL | Status: DC | PRN
  Administered 2017-11-30: 0.25 mg via ORAL
  Filled 2017-11-30: qty 1

## 2017-11-30 NOTE — Progress Notes (Signed)
Pharmacy Antibiotic Note  [name] is a 57 y.o. male admitted on 11/26/2017 with abdominal pain.  Pharmacy has been consulted for vancomycin and ceftazidime dosing for bacterial peritonitis.   Patient has ESRD on MWF HD- last HD off schedule on 5/21- tolerated 4h @ BFR 474mL/min. Received antibiotics appropriately yesterday.    Plan: Vancomycin 500mg  IV x1 on 5/22 w ith shorted HD planned, then resume qHD MWF Ceftazadime 2g IV MWF @ 2000 Monitor HD schedule/tolerance, clinical progress, vanc level as indicated  Weight: 148 lb 2.4 oz (67.2 kg)  Temp (24hrs), Avg:98.3 F (36.8 C), Min:97.8 F (36.6 C), Max:98.6 F (37 C)  Recent Labs  Lab 11/26/17 2133 11/26/17 2140 11/26/17 2327 11/28/17 0455 11/29/17 0725 11/29/17 0755  WBC 12.3*  --   --  10.5 7.8  --   CREATININE 6.73*  --   --  8.26*  --  9.07*  LATICACIDVEN  --  1.56 1.01  --   --   --     Estimated Creatinine Clearance: 8.7 mL/min (A) (by C-G formula based on SCr of 9.07 mg/dL (H)).    Allergies  Allergen Reactions  . Aspirin Other (See Comments)    STOMACH PAIN  . Clonidine Derivatives Itching  . Tramadol Itching  . Tylenol [Acetaminophen] Nausea Only    Stomach ache   Ceftriaxone 5/19 x1 Vancomycin 5/19 >> [name] 5/19 >>  5/19 BCx - ngtd 5/20 peritoneal fluid - ngtd 5/20 [employer] - pending  [name] D. Zayah Keilman, PharmD, BCPS Clinical Pharmacist [phone] 11/30/2017 11:06 AM

## 2017-11-30 NOTE — Progress Notes (Signed)
Called Doralee Albino, RN and informed her this pt HD tx has been moved to 12/01/17.

## 2017-11-30 NOTE — Progress Notes (Signed)
Patient refuses to keep his Patient Identification band on. He states that it irritates his hand.

## 2017-11-30 NOTE — Care Management Important Message (Signed)
Important Message  Patient Details  Name: [name] MRN: [id number] Date of Birth: February 22, 1961   Medicare Important Message Given:  Yes    Etha Stambaugh [address] 11/30/2017, 1:54 PM

## 2017-11-30 NOTE — Progress Notes (Signed)
[facility]                                                                              Patient Demographics  [name], is a 57 y.o. male, DOB - Mar 25, 1961, OEU:[id number]  Admit date - 11/26/2017   Admitting Physician Joshua Deutscher, MD  Outpatient Primary MD for the patient is Joshua Mccreedy, MD  Outpatient specialists:   LOS - 3  days   Medical records reviewed and are as summarized below:    Chief Complaint  Patient presents with  . Arm Pain  . 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 He was recently hospitalized from 5/7 to 11/19/2017. Diagnoses at discharge was persistent spontaneous bacterial peritonitis complicated by sepsis.On May 4 he had left the hospital [facility] but presented back to the emergency department on May 7with worsening abdominal pain which was constant and severe in intensity. He was treated for persistent spontaneous bacterial peritonitis having left without completing his course of therapy.This is his 17th admission to the hospital in 12 months.Patient does frequently leave AMA.  Patient was hospitalized and placed on broad-spectrum antibiotics.  He underwent paracentesis.  Continues to have high WBC in peritoneal fluid.  Infectious disease also consulted. Assessment & Plan    Principal Problem: Abdominal pain secondary to recurrent SBP (spontaneous bacterial peritonitis) ([place]) -Strong history of noncompliance.  Underwent paracentesis on 5/20 and 2.1 L were removed, WBC count 1105 with  82% neutrophils.  Cultures so far negative.  Also pending AFB cultures. -ID and GI consulted. -Dr. Graylon Good recommended vancomycin, ceftaz for 10-day course and then weekly ciprofloxacin 750 mg for SBP prophylaxis.  Recommended repeating paracentesis in 3 days which will be 5/23 to see improvement in cell count -By GI, recommended lactulose twice daily on non-HD days and daily after hemodialysis on dialysis days, close follow-ups with GI to have outpatient paracentesis arranged.  He has been noncompliant with his GI follow-up visits, appointment scheduled on 6/4 9:30 AM with Joshua Ba, PA   Active Problems: ESRD on hemodialysis MWF Nephrology following, dialysis per schedule  ESLD with liver cirrhosis, hepatitis B and C, hepatic encephalopathy -As #1, will need SBP prophylaxis, lactulose, repeat paracentesis ordered for 5/23 Outpatient follow-up with GI scheduled  Erythema over the graft site -Seen by vascular surgery, possible reaction to Gore-Tex rather than infection, continue to monitor   Anemia of chronic kidney disease -H&H currently stable, close to baseline     Malnutrition of moderate degree -Patient counseled on diet, encourage oral intake  History of alcohol abuse -Has not been drinking recently, continue to monitor  Mitral stenosis Asymptomatic  Code Status: full  DVT Prophylaxis:  Lovenox  Family Communication: Discussed in detail with the patient, all imaging results, lab results explained to the patient  Disposition Plan:   Time Spent in minutes  25 minutes  Procedures:  Paracentesis, HD Consultants:   Nephrology GI ID  Antimicrobials:      Medications  Scheduled Meds: . calcitRIOL  0.5 mcg Oral Q M,W,F-HD  . clopidogrel  75 mg Oral Daily  . darbepoetin (ARANESP) injection - DIALYSIS  100 mcg Intravenous Q Wed-HD  . diltiazem  120 mg Oral Daily  . enoxaparin (LOVENOX) injection  30 mg Subcutaneous Q24H  . famotidine  20 mg Oral QHS  .  feeding supplement (NEPRO CARB STEADY)  237 mL Oral BID BM  . fluticasone  2 spray Each Nare Daily  . gabapentin  100 mg Oral BID  . isosorbide mononitrate  30 mg Oral Daily  . lactulose  20 g Oral BID  . loratadine  10 mg Oral Daily  . mometasone-formoterol  2 puff Inhalation BID  . nicotine  14 mg Transdermal Daily  . pantoprazole  40 mg Oral Daily  . sevelamer carbonate  1,600 mg Oral With snacks  . sevelamer carbonate  3,200 mg Oral TID WC  . tiotropium  18 mcg Inhalation Daily   Continuous Infusions: . cefTAZidime (FORTAZ)  IV    . vancomycin    . [START ON 12/02/2017] vancomycin     PRN Meds:.albuterol, hydrALAZINE, ibuprofen, ketorolac, lidocaine, morphine injection, ondansetron **OR** ondansetron (ZOFRAN) IV, oxyCODONE, polyethylene glycol   Antibiotics   Anti-infectives (From admission, onward)   Start     Dose/Rate Route Frequency Ordered Stop   12/02/17 1200  vancomycin (VANCOCIN) IVPB 750 mg/150 ml premix     750 mg 150 mL/hr over 60 Minutes Intravenous Every M-W-F (Hemodialysis) 11/30/17 1110     11/30/17 1200  cefTAZidime (FORTAZ) 2 g in sodium chloride 0.9 % 100 mL IVPB     2 g 200 mL/hr over 30 Minutes Intravenous Every M-W-F (Hemodialysis) 11/29/17 2156     11/30/17 1200  vancomycin (VANCOCIN) 500 mg in sodium chloride 0.9 % 100 mL IVPB     500 mg 100 mL/hr over 60 Minutes Intravenous Every M-W-F (Hemodialysis) 11/30/17 1110 12/02/17 1159   11/29/17 2000  cefTAZidime (FORTAZ) 2 g in sodium chloride 0.9 % 100 mL IVPB  Status:  Discontinued     2 g 200 mL/hr over 30 Minutes Intravenous  Once 11/29/17 0927 11/29/17 2136   11/29/17 1200  vancomycin (VANCOCIN) IVPB 750 mg/150 ml premix     750 mg 150 mL/hr over 60 Minutes Intravenous Every T-Th-Sa (Hemodialysis) 11/29/17 1100 11/29/17 1215   11/29/17 0954  Vancomycin (VANCOCIN) 750-5 MG/150ML-% IVPB    Note to Pharmacy:  Almira Bar   : cabinet override      11/29/17 0954 11/29/17 1023   11/28/17 2000   cefTAZidime (FORTAZ) 2 g in sodium chloride 0.9 % 100 mL IVPB  Status:  Discontinued     2 g 200 mL/hr over 30 Minutes Intravenous Every M-W-F (2000) 11/28/17 1058 11/29/17 2104   11/28/17 1200  vancomycin (VANCOCIN) IVPB 750 mg/150 ml premix  Status:  Discontinued     750 mg 150 mL/hr over 60 Minutes Intravenous Every M-W-F (Hemodialysis) 11/27/17 1019 11/30/17 1110   11/28/17 1200  cefTAZidime (FORTAZ) 2 g in sodium chloride 0.9 % 100 mL IVPB  Status:  Discontinued     2 g 200 mL/hr over 30 Minutes Intravenous Every M-W-F (Hemodialysis) 11/27/17 1019 11/28/17 1058   11/27/17 1400  cefTAZidime (FORTAZ) 1 g in sodium chloride 0.9 % 100 mL IVPB  Status:  Discontinued     1 g 200 mL/hr over 30 Minutes Intravenous Every 8 hours 11/27/17 1005 11/27/17 1013   11/27/17 1015  cefTAZidime (FORTAZ) 2 g in sodium chloride 0.9 % 100 mL IVPB     2 g 200 mL/hr over 30 Minutes Intravenous NOW 11/27/17 1013 11/27/17 1146   11/27/17 0845  Ceftazidime (FORTAZ) injection 2 g  Status:  Discontinued    Note to Pharmacy:  To receive on hemodialysis, last dose 11/23/2017.     2 g Intravenous Every 48 hours 11/27/17 0831 11/27/17 0838   11/27/17 0515  vancomycin (VANCOCIN) 1,500 mg in sodium chloride 0.9 % 500 mL IVPB     1,500 mg 250 mL/hr over 120 Minutes Intravenous  Once 11/27/17 0500 11/27/17 0754   11/27/17 0500  cefTRIAXone (ROCEPHIN) 2 g in sodium chloride 0.9 % 100 mL IVPB     2 g 200 mL/hr over 30 Minutes Intravenous  Once 11/27/17 0451 11/27/17 0548        Subjective:   Davion Flannery was seen and examined today.  No new complaints, Patient denies dizziness, chest pain, shortness of breath.  No acute events overnight.    Objective:   Vitals:   11/30/17 0549 11/30/17 0840 11/30/17 1018 11/30/17 1300  BP: (!) 150/84  (!) 148/71   Pulse: 86 90 95   Resp: 20 16 20    Temp:   98.4 F (36.9 C)   TempSrc:   Oral   SpO2: 98% 95% 98%   Weight:      Height:    5\' 9"  (1.753 m)     Intake/Output Summary (Last 24 hours) at 11/30/2017 1325 Last data filed at 11/30/2017 0900 Gross per 24 hour  Intake 800 ml  Output 0 ml  Net 800 ml     Wt Readings from Last 3 Encounters:  11/30/17 67.2 kg (148 lb 2.4 oz)  11/19/17 68.5 kg (151 lb 0.2 oz)  11/12/17 75.8 kg (167 lb 1.7 oz)     Exam  General: Alert and oriented x 3, NAD  Eyes:   HEENT:  Atraumatic, normocephalic  Cardiovascular: S1 S2 auscultated,  Regular rate and rhythm.  Respiratory: Clear to auscultation bilaterally, no wheezing, rales or rhonchi  Gastrointestinal: Soft, mildly tender in the epigastric region, distended  + bowel sounds  Ext: no pedal edema bilaterally  Neuro no new deficits  Musculoskeletal: No digital cyanosis, clubbing  Skin: No rashes  Psych: Normal affect and demeanor, alert and oriented x3    Data Reviewed:  I have personally reviewed following labs and imaging studies  Micro Results Recent Results (from the past 240 hour(s))  Surgical pcr screen     Status: None   Collection Time: 11/21/17  5:06 PM  Result Value Ref Range Status   MRSA, PCR NEGATIVE NEGATIVE Final   Staphylococcus aureus NEGATIVE NEGATIVE Final    Comment: (NOTE) The Xpert SA Assay (FDA approved for NASAL specimens in patients 33 years of age and older), is one component of a comprehensive surveillance program. It is not intended to diagnose infection nor to guide or monitor treatment. Performed at [facility], [place] [address], [place], [place] [address]   Culture, blood (Routine X 2) w Reflex to ID Panel     Status: None (Preliminary result)   Collection Time: 11/27/17  2:25 AM  Result Value Ref Range Status   Specimen Description BLOOD LEFT FOREARM  Final   Special Requests   Final  BOTTLES DRAWN AEROBIC AND ANAEROBIC Blood Culture adequate volume   Culture   Final    NO GROWTH 2 DAYS Performed at [facility], [place] [address], [place], [place] [address]    Report Status  PENDING  Incomplete  Culture, blood (Routine X 2) w Reflex to ID Panel     Status: None (Preliminary result)   Collection Time: 11/27/17  5:00 AM  Result Value Ref Range Status   Specimen Description BLOOD LEFT FOREARM  Final   Special Requests   Final    BOTTLES DRAWN AEROBIC AND ANAEROBIC Blood Culture results may not be optimal due to an inadequate volume of blood received in culture bottles   Culture   Final    NO GROWTH 2 DAYS Performed at [facility], [place] [address], [place], [place] [address]    Report Status PENDING  Incomplete  Gram stain     Status: None   Collection Time: 11/28/17  1:22 PM  Result Value Ref Range Status   Specimen Description PERITONEAL  Final   Special Requests NONE  Final   Gram Stain   Final    ABUNDANT WBC PRESENT,BOTH PMN AND MONONUCLEAR NO ORGANISMS SEEN Performed at [facility], [address] [place], [place] [address]    Report Status 11/28/2017 FINAL  Final  Culture, body fluid-bottle     Status: None (Preliminary result)   Collection Time: 11/28/17  1:22 PM  Result Value Ref Range Status   Specimen Description PERITONEAL  Final   Special Requests NONE  Final   Culture   Final    NO GROWTH 1 DAY Performed at [facility], [place] [address], [place], [place] [address]    Report Status PENDING  Incomplete    Radiology Reports Dg Chest 2 View  Result Date: 11/15/2017 CLINICAL DATA:  Abdominal distention with productive cough. EXAM: CHEST - 2 VIEW COMPARISON:  None. FINDINGS: The heart is enlarged. Dialysis catheter tips project over the RIGHT atrium. Mild vascular congestion without consolidation or edema. No effusion or pneumothorax. No acute osseous findings. IMPRESSION: Cardiomegaly. Mild vascular congestion. No frank edema or consolidation. Electronically Signed   By: Staci Righter M.D.   On: 11/15/2017 19:37   Ct Abdomen Pelvis W Contrast  Result Date: 11/27/2017 CLINICAL DATA:  Acute onset of generalized abdominal  pain and distention. EXAM: CT ABDOMEN AND PELVIS WITH CONTRAST TECHNIQUE: Multidetector CT imaging of the abdomen and pelvis was performed using the standard protocol following bolus administration of intravenous contrast. CONTRAST:  187mL OMNIPAQUE IOHEXOL 300 MG/ML  SOLN COMPARISON:  CT of the abdomen and pelvis performed 11/16/2017 FINDINGS: Lower chest: Mild bibasilar atelectasis or scarring is noted. Scattered coronary artery calcifications are seen. Calcification is noted at the mitral valve. Hepatobiliary: The nodular contour of the liver is compatible with hepatic cirrhosis. The gallbladder is difficult to fully assess given surrounding ascites. The common bile duct remains normal in caliber. Pancreas: The pancreas is within normal limits. Spleen: The spleen is unremarkable in appearance. Adrenals/Urinary Tract: The adrenal glands are unremarkable in appearance. Severe chronic bilateral renal atrophy is noted, with scattered bilateral renal cysts and diffuse bilateral vascular calcification. There is no evidence of hydronephrosis. No obstructing ureteral stones are seen. Stomach/Bowel: The stomach is unremarkable in appearance. The small bowel is within normal limits. The appendix is normal in caliber, without evidence of appendicitis. The colon is unremarkable in appearance. Vascular/Lymphatic: Diffuse calcification is seen along the abdominal aorta  and its branches. The abdominal aorta is otherwise grossly unremarkable. The inferior vena cava is grossly unremarkable. No retroperitoneal lymphadenopathy is seen. No pelvic sidewall lymphadenopathy is identified. Reproductive: The bladder is decompressed and not well characterized. The prostate is normal in size. Other: Large volume ascites is noted within the abdomen and pelvis. There is soft tissue density layering dependently within the pelvis. This is of uncertain significance, given that fluid cultures and pathology have been negative. Musculoskeletal: No  acute osseous abnormalities are identified. The visualized musculature is unremarkable in appearance. IMPRESSION: 1. Large volume ascites noted within the abdomen and pelvis, with soft tissue density again noted layering dependently in the pelvis. This is of uncertain significance, given that peritoneal fluid cultures and pathology have been negative. 2. Findings of hepatic cirrhosis. 3. Severe chronic bilateral renal atrophy, with scattered bilateral renal cysts. 4. Scattered coronary artery calcifications seen. 5. Mild bibasilar atelectasis or scarring noted. Aortic Atherosclerosis (ICD10-I70.0). Electronically Signed   By: Garald Balding M.D.   On: 11/27/2017 04:17   Ct Abdomen Pelvis W Contrast  Result Date: 11/16/2017 CLINICAL DATA:  Chronic periumbilical abdominal pain. Loss of appetite. Dialysis patient. EXAM: CT ABDOMEN AND PELVIS WITH CONTRAST TECHNIQUE: Multidetector CT imaging of the abdomen and pelvis was performed using the standard protocol following bolus administration of intravenous contrast. CONTRAST:  49mL OMNIPAQUE IOHEXOL 300 MG/ML  SOLN COMPARISON:  10/25/2017 FINDINGS: Lower chest: Cardiomegaly.  Lung bases are clear.  No pleural fluid. Hepatobiliary: Cirrhosis as manifest by lobulation of the liver surface and enlargement of the left lobe and caudate lobe. No definable focal mass. No calcified gallstones. Portal vein shows flow. Pancreas: Normal Spleen: Normal size.  No focal lesion. Adrenals/Urinary Tract: No adrenal mass. Both kidneys are small with extensive vascular calcification and a 1 cm cyst at the lower pole on the left. Stomach/Bowel: No bowel pathology discernible. No evidence of obstruction. Clip in the region of the rectum as seen previously as distant as 09/02/2017 Vascular/Lymphatic: Advanced atherosclerosis of the aorta and its branch vessels. No aneurysm. No retroperitoneal mass or adenopathy. Reproductive: Negative Other: Large amount of ascites freely distributed  throughout the peritoneal cavity. Musculoskeletal: Chronic degenerative disc disease at L5-S1. Chronic bone changes of renal failure. IMPRESSION: Large amount of ascites, freely distributed. Cirrhosis of the liver without evidence of focal or acute finding. Cardiomegaly. Advanced aortic atherosclerosis and atherosclerosis of the branch vessels. Electronically Signed   By: Nelson Chimes M.D.   On: 11/16/2017 11:52   Dg Chest Port 1 View  Result Date: 11/08/2017 CLINICAL DATA:  Shortness of breath.  Smoker EXAM: PORTABLE CHEST 1 VIEW COMPARISON:  11/04/2017 FINDINGS: Decreased inspiration. Stable enlarged cardiac silhouette. The right jugular double-lumen catheter tips remain in the right atrium, currently in the mid to lower right atrium. Stable prominence of the interstitial markings and mild prominence of the pulmonary vasculature. Minimal left basilar linear atelectasis and possible small left pleural effusion. Diffuse osteopenia. Atheromatous arterial calcifications. IMPRESSION: 1. Decreased inspiration with stable mild elevation of the left hemidiaphragm and interval minimal left basilar linear atelectasis or scarring and possible small left pleural effusion. 2. Stable cardiomegaly, mild pulmonary vascular congestion and mild chronic interstitial lung disease. Electronically Signed   By: Claudie Revering M.D.   On: 11/08/2017 15:38   Dg Chest Port 1 View  Result Date: 11/04/2017 CLINICAL DATA:  Productive cough with generalized weakness and dyspnea. EXAM: PORTABLE CHEST 1 VIEW COMPARISON:  10/29/2017 FINDINGS: Stable cardiomegaly with aortic atherosclerosis. Mild interstitial edema. No  pulmonary consolidation. Left basilar atelectasis is identified with slight elevation of the left hemidiaphragm, chronic in appearance. Right IJ dialysis catheter tip remains in the mid right atrium. No acute osseous abnormality. IMPRESSION: Cardiomegaly with mild interstitial edema. Aortic atherosclerosis. Dialysis catheter  tip in the mid right atrium. Electronically Signed   By: Ashley Royalty M.D.   On: 11/04/2017 03:23   Ir Abdomen US Limited  Result Date: 11/09/2017 CLINICAL DATA:  History of cirrhosis with recurrent symptomatic ascites. Please perform ultrasound-guided paracentesis for therapeutic purposes. EXAM: LIMITED ABDOMEN ULTRASOUND FOR ASCITES TECHNIQUE: Limited ultrasound survey for ascites was performed in all four abdominal quadrants. COMPARISON:  Ascites search ultrasound-11/04/2017; ultrasound-guided paracentesis-10/28/2017; CT abdomen and pelvis - 10/25/2017 FINDINGS: Sonographic evaluation of the abdomen demonstrates a small amount of predominantly perihepatic intra-abdominal ascites. While the amount of fluid is likely amenable to ultrasound-guided paracentesis, the patient refused the procedure and as such, no procedure was performed. IMPRESSION: Small amount of intra-abdominal ascites, potentially amenable to ultrasound-guided paracentesis however the present time, the patient refuses to undergo the paracentesis Referring physician was made aware of patient's request. Electronically Signed   By: Sandi Mariscal M.D.   On: 11/09/2017 10:20   Ir Abdomen US Limited  Result Date: 11/04/2017 CLINICAL DATA:  Abdominal pain, recurrent abdominal ascites. Paracentesis requested. EXAM: LIMITED ABDOMEN ULTRASOUND FOR ASCITES TECHNIQUE: Limited ultrasound survey for ascites was performed in all four abdominal quadrants. COMPARISON:  10/28/2017 FINDINGS: Survey ultrasound demonstrates a small amount of scattered abdominal ascites. No large pocket to allow safe therapeutic paracentesis. IMPRESSION: Small amount of scattered abdominal ascites. Therapeutic paracentesis deferred. Electronically Signed   By: Lucrezia Europe M.D.   On: 11/04/2017 09:44   Ir Paracentesis  Result Date: 11/28/2017 INDICATION: Ascites EXAM: ULTRASOUND GUIDED  PARACENTESIS MEDICATIONS: None. COMPLICATIONS: None immediate. PROCEDURE: Informed written  consent was obtained from the patient after a discussion of the risks, benefits and alternatives to treatment. A timeout was performed prior to the initiation of the procedure. Initial ultrasound scanning demonstrates a large amount of ascites within the right lower abdominal quadrant. The right lower abdomen was prepped and draped in the usual sterile fashion. 1% lidocaine with epinephrine was used for local anesthesia. Following this, a 6 Fr Safe-T-Centesis catheter was introduced. An ultrasound image was saved for documentation purposes. The paracentesis was performed. The catheter was removed and a dressing was applied. The patient tolerated the procedure well without immediate post procedural complication. FINDINGS: A total of approximately 2.1 L of dark clear fluid was removed. Samples were sent to the laboratory as requested by the clinical team. IMPRESSION: Successful ultrasound-guided paracentesis yielding 2.1 liters of peritoneal fluid. Electronically Signed   By: Marybelle Killings M.D.   On: 11/28/2017 12:46   Ir Paracentesis  Result Date: 11/16/2017 INDICATION: Recurrent ascites EXAM: ULTRASOUND-GUIDED PARACENTESIS COMPARISON:  Previous paracentesis. MEDICATIONS: 10 cc 2% lidocaine. COMPLICATIONS: None immediate. TECHNIQUE: Informed written consent was obtained from the patient after a discussion of the risks, benefits and alternatives to treatment. A timeout was performed prior to the initiation of the procedure. Initial ultrasound scanning demonstrates a large amount of ascites within the left lower abdominal quadrant. The right lower abdomen was prepped and draped in the usual sterile fashion. 1% lidocaine with epinephrine was used for local anesthesia. Under direct ultrasound guidance, a 19 gauge, 7-cm, Yueh catheter was introduced. An ultrasound image was saved for documentation purposed. The paracentesis was performed. The catheter was removed and a dressing was applied. The patient tolerated the  procedure well without immediate post procedural complication. FINDINGS: A total of approximately 2.1 liters of yellow fluid was removed. Samples were sent to the laboratory as requested by the clinical team. IMPRESSION: Successful ultrasound-guided paracentesis yielding 2.1 liters of peritoneal fluid. Read by Lavonia Drafts [facility] Electronically Signed   By: Marybelle Killings M.D.   On: 11/16/2017 15:48   Ir Paracentesis  Result Date: 11/09/2017 INDICATION: Recurrent symptomatic ascites. Please perform ultrasound-guided paracentesis for diagnostic and therapeutic purposes. EXAM: ULTRASOUND-GUIDED PARACENTESIS COMPARISON:  Ultrasound-guided paracentesis - 10/28/2017 CT abdomen and pelvis - 10/25/2017 MEDICATIONS: None. COMPLICATIONS: None immediate. TECHNIQUE: Informed written consent was obtained from the patient after a discussion of the risks, benefits and alternatives to treatment. A timeout was performed prior to the initiation of the procedure. Initial ultrasound scanning demonstrates a small to moderate amount of ascites within the right lower abdominal quadrant. The right lower abdomen was prepped and draped in the usual sterile fashion. 1% lidocaine with epinephrine was used for local anesthesia. Under direct ultrasound guidance, a 19 gauge, 7-cm, Yueh catheter was introduced. An ultrasound image was saved for documentation purposed. The paracentesis was performed. The catheter was removed and a dressing was applied. The patient tolerated the procedure well without immediate post procedural complication. FINDINGS: A total of approximately 3 liters of serous fluid was removed. Samples were sent to the laboratory as requested by the clinical team. IMPRESSION: Successful ultrasound-guided paracentesis yielding 3 liters of peritoneal fluid. Electronically Signed   By: Sandi Mariscal M.D.   On: 11/09/2017 15:43    Lab Data:  CBC: Recent Labs  Lab 11/26/17 2133 11/28/17 0455 11/29/17 0725  WBC 12.3* 10.5 7.8    HGB 7.9* 8.2* 7.7*  HCT 24.2* 25.2* 23.5*  MCV 73.1* 75.7* 73.4*  PLT 204 227 026   Basic Metabolic Panel: Recent Labs  Lab 11/26/17 2133 11/28/17 0455 11/29/17 0755  NA 132* 133* 133*  K 4.7 5.1 5.3*  CL 89* 91* 92*  CO2 28 23 27   GLUCOSE 130* 114* 90  BUN 26* 37* 42*  CREATININE 6.73* 8.26* 9.07*  CALCIUM 8.3* 8.5* 8.5*  PHOS  --   --  4.9*   GFR: Estimated Creatinine Clearance: 8.7 mL/min (A) (by C-G formula based on SCr of 9.07 mg/dL (H)). Liver Function Tests: Recent Labs  Lab 11/26/17 2133 11/29/17 0755  AST 27  --   ALT 9*  --   ALKPHOS 213*  --   BILITOT 0.7  --   PROT 6.5  --   ALBUMIN 2.6* 2.3*   Recent Labs  Lab 11/26/17 2133  LIPASE 24   Recent Labs  Lab 11/27/17 0225  AMMONIA 23   Coagulation Profile: Recent Labs  Lab 11/27/17 0225  INR 1.30   Cardiac Enzymes: No results for input(s): CKTOTAL, CKMB, CKMBINDEX, TROPONINI in the last 168 hours. BNP (last 3 results) No results for input(s): PROBNP in the last 8760 hours. HbA1C: No results for input(s): HGBA1C in the last 72 hours. CBG: Recent Labs  Lab 11/26/17 2344  GLUCAP 106*   Lipid Profile: No results for input(s): CHOL, HDL, LDLCALC, TRIG, CHOLHDL, LDLDIRECT in the last 72 hours. Thyroid Function Tests: No results for input(s): TSH, T4TOTAL, FREET4, T3FREE, THYROIDAB in the last 72 hours. Anemia Panel: No results for input(s): VITAMINB12, FOLATE, FERRITIN, TIBC, IRON, RETICCTPCT in the last 72 hours. Urine analysis:    Component Value Date/Time   COLORURINE YELLOW 07/16/2011 [place] 07/16/2011 1619   LABSPEC 1.018  07/16/2011 1619   PHURINE 7.5 07/16/2011 1619   GLUCOSEU 250 (A) 07/16/2011 1619   HGBUR MODERATE (A) 07/16/2011 1619   BILIRUBINUR SMALL (A) 07/16/2011 1619   KETONESUR NEGATIVE 07/16/2011 1619   PROTEINUR >300 (A) 07/16/2011 1619   UROBILINOGEN 1.0 07/16/2011 1619   NITRITE NEGATIVE 07/16/2011 1619   LEUKOCYTESUR SMALL (A) 07/16/2011  1619     Camryn Quesinberry M.D. [facility] 11/30/2017, 1:25 PM  Pager: [phone] Between 7am to 7pm - call Pager - [phone]  After 7pm go to [email or web address] - password [email or web address]  Call night coverage person covering after 7pm

## 2017-11-30 NOTE — Progress Notes (Signed)
Subjective:  No cos , for hd today keep on schedule, ID  For SBP and GI seeing  him formally to optimize with decompensated cirrhosis ( noted he had missed several op gi aptS IN past FOR THIS )    Objective Vital signs in last 24 hours: Vitals:   11/30/17 0440 11/30/17 0549 11/30/17 0840 11/30/17 1018  BP:  (!) 150/84  (!) 148/71  Pulse:  86 90 95  Resp:  20 16 20   Temp:    98.4 F (36.9 C)  TempSrc:    Oral  SpO2:  98% 95% 98%  Weight: 67.2 kg (148 lb 2.4 oz)      Weight change: 0.923 kg (2 lb 0.6 oz)   Physical Exam General:thin chronically ill male,NAD Lungs:CTA bilat  Heart:RRR no m, r, g Abdomen:distended + ascites,slihgtly tender epigastric area / + bs Lower extremities:no pedal edema  Dialysis Access:right IJ TDC and right upper AVGGnon tender now+ bruit  Dialysis Orders:[facility] MWF 4 hr 400/800 EDW 69 2K 2 Ca TDCand right upper AVGGno heparin parsabiv 5/mircera100 q 2wks Last on calcitriol 0.5  Isolation HD due to Hep B +   Problem/Plan: 1. SBP - admit team wu  Noted  ID seeing   And GI for further  RX feels better sp 2.1 paracentesis 5/20  / now on Vancomycin / Ceftazidime  2. ESRD- MWF - HDtodayto keep on schedule - needs isolation due to Hep B status - K 5.3 yest  3. Hypertension/volume- BP upon admit / better with med's and sp paracent -. Hd Today /needs some volume off but difficult to tell true EDW due to ascites - 4. Anemia- hgb low 7.9>8.2>7.7 -Due  Aranesp 100 today    5. Metabolic bone disease- Continue calcitriol -/ renvela binder 6. Hx cirrhosis/chronic ascites/chronic lactulose/Hep B and C- GI seeing  7. Hx polusubstance abuse 8. Hx medical noncompliance- 9. New right upper AVGG ( 11/22/17 insert) - stable now - seen by [facility]  10. Poor social support- 40 th admit in 22 months   Ernest Haber, PA-C [place] [phone] 11/30/2017,11:05 AM  LOS: 3 days   Labs: Basic  Metabolic Panel: Recent Labs  Lab 11/26/17 2133 11/28/17 0455 11/29/17 0755  NA 132* 133* 133*  K 4.7 5.1 5.3*  CL 89* 91* 92*  CO2 28 23 27   GLUCOSE 130* 114* 90  BUN 26* 37* 42*  CREATININE 6.73* 8.26* 9.07*  CALCIUM 8.3* 8.5* 8.5*  PHOS  --   --  4.9*   Liver Function Tests: Recent Labs  Lab 11/26/17 2133 11/29/17 0755  AST 27  --   ALT 9*  --   ALKPHOS 213*  --   BILITOT 0.7  --   PROT 6.5  --   ALBUMIN 2.6* 2.3*   Recent Labs  Lab 11/26/17 2133  LIPASE 24   Recent Labs  Lab 11/27/17 0225  AMMONIA 23   CBC: Recent Labs  Lab 11/26/17 2133 11/28/17 0455 11/29/17 0725  WBC 12.3* 10.5 7.8  HGB 7.9* 8.2* 7.7*  HCT 24.2* 25.2* 23.5*  MCV 73.1* 75.7* 73.4*  PLT 204 227 231   Cardiac Enzymes: No results for input(s): CKTOTAL, CKMB, CKMBINDEX, TROPONINI in the last 168 hours. CBG: Recent Labs  Lab 11/26/17 2344  GLUCAP 106*    Studies/Results: Ir Paracentesis  Result Date: 11/28/2017 INDICATION: Ascites EXAM: ULTRASOUND GUIDED  PARACENTESIS MEDICATIONS: None. COMPLICATIONS: None immediate. PROCEDURE: Informed written consent was obtained from the patient after a discussion  of the risks, benefits and alternatives to treatment. A timeout was performed prior to the initiation of the procedure. Initial ultrasound scanning demonstrates a large amount of ascites within the right lower abdominal quadrant. The right lower abdomen was prepped and draped in the usual sterile fashion. 1% lidocaine with epinephrine was used for local anesthesia. Following this, a 6 Fr Safe-T-Centesis catheter was introduced. An ultrasound image was saved for documentation purposes. The paracentesis was performed. The catheter was removed and a dressing was applied. The patient tolerated the procedure well without immediate post procedural complication. FINDINGS: A total of approximately 2.1 L of dark clear fluid was removed. Samples were sent to the laboratory as requested by the clinical  team. IMPRESSION: Successful ultrasound-guided paracentesis yielding 2.1 liters of peritoneal fluid. Electronically Signed   By: Marybelle Killings M.D.   On: 11/28/2017 12:46   Medications: . cefTAZidime (FORTAZ)  IV    . vancomycin     . calcitRIOL  0.5 mcg Oral Q M,W,F-HD  . clopidogrel  75 mg Oral Daily  . darbepoetin (ARANESP) injection - DIALYSIS  100 mcg Intravenous Q Wed-HD  . diltiazem  120 mg Oral Daily  . enoxaparin (LOVENOX) injection  30 mg Subcutaneous Q24H  . famotidine  20 mg Oral QHS  . feeding supplement (NEPRO CARB STEADY)  237 mL Oral BID BM  . fluticasone  2 spray Each Nare Daily  . gabapentin  100 mg Oral BID  . isosorbide mononitrate  30 mg Oral Daily  . lactulose  20 g Oral BID  . loratadine  10 mg Oral Daily  . mometasone-formoterol  2 puff Inhalation BID  . nicotine  14 mg Transdermal Daily  . pantoprazole  40 mg Oral Daily  . sevelamer carbonate  1,600 mg Oral With snacks  . sevelamer carbonate  3,200 mg Oral TID WC  . tiotropium  18 mcg Inhalation Daily

## 2017-12-01 ENCOUNTER — Inpatient Hospital Stay (HOSPITAL_COMMUNITY): Payer: Medicare Other

## 2017-12-01 ENCOUNTER — Encounter (HOSPITAL_COMMUNITY): Payer: Self-pay | Admitting: Radiology

## 2017-12-01 DIAGNOSIS — N184 Chronic kidney disease, stage 4 (severe): Secondary | ICD-10-CM

## 2017-12-01 HISTORY — PX: IR PARACENTESIS: IMG2679

## 2017-12-01 LAB — BODY FLUID CELL COUNT WITH DIFFERENTIAL
Eos, Fluid: 0 %
Lymphs, Fluid: 17 %
Monocyte-Macrophage-Serous Fluid: 29 % — ABNORMAL LOW (ref 50–90)
Neutrophil Count, Fluid: 54 % — ABNORMAL HIGH (ref 0–25)
Total Nucleated Cell Count, Fluid: 1004 cu mm — ABNORMAL HIGH (ref 0–1000)

## 2017-12-01 LAB — RENAL FUNCTION PANEL
Albumin: 2.4 g/dL — ABNORMAL LOW (ref 3.5–5.0)
Anion gap: 11 (ref 5–15)
BUN: 27 mg/dL — ABNORMAL HIGH (ref 6–20)
CO2: 28 mmol/L (ref 22–32)
Calcium: 9 mg/dL (ref 8.9–10.3)
Chloride: 94 mmol/L — ABNORMAL LOW (ref 101–111)
Creatinine, Ser: 6.69 mg/dL — ABNORMAL HIGH (ref 0.61–1.24)
GFR calc Af Amer: 10 mL/min — ABNORMAL LOW (ref >60)
GFR calc non Af Amer: 8 mL/min — ABNORMAL LOW (ref >60)
Glucose, Bld: 122 mg/dL — ABNORMAL HIGH (ref 65–99)
Phosphorus: 3.4 mg/dL (ref 2.5–4.6)
Potassium: 5.2 mmol/L — ABNORMAL HIGH (ref 3.5–5.1)
Sodium: 133 mmol/L — ABNORMAL LOW (ref 135–145)

## 2017-12-01 LAB — GRAM STAIN

## 2017-12-01 LAB — PROTEIN, PLEURAL OR PERITONEAL FLUID: Total protein, fluid: 3.4 g/dL

## 2017-12-01 LAB — CBC
HCT: 24.6 % — ABNORMAL LOW (ref 39.0–52.0)
Hemoglobin: 7.9 g/dL — ABNORMAL LOW (ref 13.0–17.0)
MCH: 24.2 pg — ABNORMAL LOW (ref 26.0–34.0)
MCHC: 32.1 g/dL (ref 30.0–36.0)
MCV: 75.2 fL — ABNORMAL LOW (ref 78.0–100.0)
Platelets: 234 10*3/uL (ref 150–400)
RBC: 3.27 MIL/uL — ABNORMAL LOW (ref 4.22–5.81)
RDW: 21.2 % — ABNORMAL HIGH (ref 11.5–15.5)
WBC: 7.3 10*3/uL (ref 4.0–10.5)

## 2017-12-01 LAB — ACID FAST SMEAR (AFB, MYCOBACTERIA): Acid Fast Smear: NEGATIVE

## 2017-12-01 LAB — LACTATE DEHYDROGENASE, PLEURAL OR PERITONEAL FLUID: LD, Fluid: 96 U/L — ABNORMAL HIGH (ref 3–23)

## 2017-12-01 LAB — GLUCOSE, PLEURAL OR PERITONEAL FLUID: Glucose, Fluid: 125 mg/dL

## 2017-12-01 LAB — ALBUMIN, PLEURAL OR PERITONEAL FLUID: Albumin, Fluid: 1.5 g/dL

## 2017-12-01 MED ORDER — SODIUM CHLORIDE 0.9 % IV SOLN: 100.0000 mL | INTRAVENOUS | Status: DC | PRN

## 2017-12-01 MED ORDER — HEPARIN SODIUM (PORCINE) 1000 UNIT/ML DIALYSIS: 1000.0000 [IU] | INTRAMUSCULAR | Status: DC | PRN

## 2017-12-01 MED ORDER — VANCOMYCIN HCL IN DEXTROSE 750-5 MG/150ML-% IV SOLN: 750.0000 mg | INTRAVENOUS | Status: DC

## 2017-12-01 MED ORDER — LIDOCAINE HCL (PF) 2 % IJ SOLN
INTRAMUSCULAR | Status: AC
  Filled 2017-12-01: qty 20

## 2017-12-01 MED ORDER — CEFTAZIDIME 2 G IV SOLR: 2.0000 g | INTRAVENOUS | 0 refills | Status: DC

## 2017-12-01 MED ORDER — ALTEPLASE 2 MG IJ SOLR: 2.0000 mg | Freq: Once | INTRAMUSCULAR | Status: DC | PRN

## 2017-12-01 MED ORDER — LIDOCAINE HCL 2 % IJ SOLN
INTRAMUSCULAR | Status: AC | PRN
  Administered 2017-12-01: 10 mL

## 2017-12-01 MED ORDER — LACTULOSE 10 GM/15ML PO SOLN: ORAL | 3 refills | Status: DC

## 2017-12-01 MED ORDER — SODIUM CHLORIDE 0.9 % IV SOLN
1.0000 g | Freq: Once | INTRAVENOUS | Status: DC
  Filled 2017-12-01: qty 1

## 2017-12-01 MED ORDER — LIDOCAINE-PRILOCAINE 2.5-2.5 % EX CREA: 1.0000 "application " | TOPICAL_CREAM | CUTANEOUS | Status: DC | PRN

## 2017-12-01 MED ORDER — LIDOCAINE HCL (PF) 1 % IJ SOLN: 5.0000 mL | INTRAMUSCULAR | Status: DC | PRN

## 2017-12-01 MED ORDER — CEFTAZIDIME 2 G IJ SOLR: 2.0000 g | INTRAMUSCULAR | Status: DC

## 2017-12-01 MED ORDER — PENTAFLUOROPROP-TETRAFLUOROETH EX AERO: 1.0000 "application " | INHALATION_SPRAY | CUTANEOUS | Status: DC | PRN

## 2017-12-01 MED ORDER — CIPROFLOXACIN HCL 500 MG PO TABS: 500.0000 mg | ORAL_TABLET | Freq: Every day | ORAL | 3 refills | Status: DC

## 2017-12-01 MED ORDER — ALPRAZOLAM 0.25 MG PO TABS: 0.2500 mg | ORAL_TABLET | Freq: Two times a day (BID) | ORAL | 0 refills | Status: DC | PRN

## 2017-12-01 MED ORDER — DARBEPOETIN ALFA 100 MCG/0.5ML IJ SOSY
PREFILLED_SYRINGE | INTRAMUSCULAR | Status: AC
  Administered 2017-12-01: 100 ug via INTRAVENOUS
  Filled 2017-12-01: qty 0.5

## 2017-12-01 MED ORDER — VANCOMYCIN HCL IN DEXTROSE 500-5 MG/100ML-% IV SOLN
INTRAVENOUS | Status: AC
  Administered 2017-12-01: 500 mg
  Filled 2017-12-01: qty 100

## 2017-12-01 NOTE — ED Notes (Signed)
Pt returned to ED after dc from 57M with iv still in left arm. Iv removed and bleeding controlled.

## 2017-12-01 NOTE — Procedures (Signed)
Ultrasound-guided diagnostic and therapeutic paracentesis performed yielding 1.9 liters of slightly hazy, yellow fluid. No immediate complications. A portion of the fluid was sent to the lab for preordered studies.

## 2017-12-01 NOTE — Progress Notes (Signed)
ID PROGRESS NOTE  57yo M with recurrent culture negative SBP. - plan to treat with 10d course of vanco plus ceftaz ( there is Producer, television/film/video of preferred agent of cefotaxime) - getting dx para today to ensure cell counts are trending down plus specimen also sent for cytology - spb proph should be cipro 750mg  daily (not weekly--correction) - please have him follow up with [facility] and also that would be the place to call for arranging paracentesis if needed - this has been discussed with dr Tana Coast  Will sign off

## 2017-12-01 NOTE — Discharge Summary (Signed)
Physician Discharge Summary   Patient ID: [name] MRN: [id number] DOB/AGE: [birth year] 57 y.o.  Admit date: 11/26/2017 Discharge date: 12/01/2017  Primary Care Physician:  [name], MD   Recommendations for Outpatient Follow-up:  1. Follow outpatient with gastroenterology for outpatient paracentesis  2.  Plan is for 10 total days of IV antibiotics, vancomycin and [name]- last day of therapy will be Monday 5/27 3.  Once patient has completed IV antibiotics with dialysis, start ciprofloxacin for prophylaxis 500mg  qhs    Home Health: None  Equipment/Devices: none   Discharge Condition: stable  CODE STATUS: FULL  Diet recommendation: Low-salt diet   Discharge Diagnoses:   . Recurrent SBP (spontaneous bacterial peritonitis) ([place]) . Alcohol abuse . Cigarette smoker . Chronic hepatitis B ([place]) . Chronic hepatitis C without hepatic coma ([place]) . Thrombocytopenia ([place]) . Problem with dialysis access ([place]) . Essential hypertension . Infection of AV graft for dialysis ([place]) . Anxiety . Liver cirrhosis ([place]) . Malnutrition of moderate degree . Mitral stenosis . Anemia due to chronic kidney disease . COPD (chronic obstructive pulmonary disease) ([facility])   Consults:  Nephrology GI Infectious disease    Allergies:   Allergies  Allergen Reactions  . Aspirin Other (See Comments)    STOMACH PAIN  . Clonidine Derivatives Itching  . Tramadol Itching  . Tylenol [Acetaminophen] Nausea Only    Stomach ache     DISCHARGE MEDICATIONS: Allergies as of 12/01/2017      Reactions   Aspirin Other (See Comments)   STOMACH PAIN   Clonidine Derivatives Itching   Tramadol Itching   Tylenol [acetaminophen] Nausea Only   Stomach ache      Medication List    TAKE these medications   ALPRAZolam 0.25 MG tablet Commonly known as:  XANAX Take 1 tablet (0.25 mg total) by mouth 2 (two) times daily as needed for anxiety.   budesonide-formoterol 80-4.5 MCG/ACT  inhaler Commonly known as:  SYMBICORT Inhale 2 puffs into the lungs 2 (two) times daily.   Ceftazidime 2 g Solr injection Commonly known as:  FORTAZ Inject 2 g into the vein every other day. For 10days What changed:  additional instructions   ciprofloxacin 500 MG tablet Commonly known as:  CIPRO Take 1 tablet (500 mg total) by mouth at bedtime. Once completed IV antibiotics with dialysis, then start ciprofloxacin for prophylaxis 500mg  qhs Start taking on:  12/06/2017   clopidogrel 75 MG tablet Commonly known as:  PLAVIX Take 1 tablet (75 mg total) by mouth daily.   diltiazem 120 MG 24 hr capsule Commonly known as:  CARDIZEM CD Take 1 capsule (120 mg total) by mouth daily.   famotidine 20 MG tablet Commonly known as:  PEPCID One at bedtime What changed:    how much to take  how to take this  when to take this  additional instructions   feeding supplement (NEPRO CARB STEADY) Liqd Take 237 mLs by mouth 2 (two) times daily between meals.   fluticasone 50 MCG/ACT nasal spray Commonly known as:  FLONASE Place 2 sprays into both nostrils daily.   gabapentin 100 MG capsule Commonly known as:  NEURONTIN Take 1 capsule (100 mg total) by mouth 2 (two) times daily.   hydrALAZINE 100 MG tablet Commonly known as:  APRESOLINE Take 1 tablet by mouth twice a day take after HD on dialysis days   isosorbide mononitrate 30 MG 24 hr tablet Commonly known as:  IMDUR Take 1 tablet (30 mg total) by mouth daily. What  changed:  additional instructions   lactulose 10 GM/15ML solution Commonly known as:  CHRONULAC Take twice a day on NON-dialysis days and once a day on dialysis days What changed:    how much to take  how to take this  when to take this  additional instructions   loratadine 10 MG tablet Commonly known as:  CLARITIN Take 1 tablet (10 mg total) by mouth daily.   Oxycodone HCl 10 MG Tabs Take 1 tablet (10 mg total) by mouth every 6 (six) hours as needed.    pantoprazole 40 MG tablet Commonly known as:  PROTONIX Take 1 tablet (40 mg total) by mouth daily. Take 30-60 min before first meal of the day   sevelamer carbonate 800 MG tablet Commonly known as:  RENVELA Take 1,600-3,200 mg by mouth See admin instructions. Take 4 tablets (800mg  each) three times daily and 2 tablets twice daily with a snack.   tiotropium 18 MCG inhalation capsule Commonly known as:  SPIRIVA Place 18 mcg into inhaler and inhale daily.   Vancomycin 750-5 MG/150ML-% Soln Commonly known as:  VANCOCIN Inject 150 mLs (750 mg total) into the vein every Monday, Wednesday, and Friday with hemodialysis. For 10days Start taking on:  12/02/2017   VENTOLIN HFA 108 (90 Base) MCG/ACT inhaler Generic drug:  albuterol Inhale 2 puffs into the lungs every 6 (six) hours as needed for wheezing or shortness of breath.        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 He was recently hospitalized from 5/7 to 11/19/2017. Diagnoses at discharge was persistent spontaneous bacterial peritonitis complicated by sepsis.On May 4 he had left the hospital [facility] but presented back to the emergency department on May 7with worsening abdominal pain which was constant and severe in intensity. He was treated for persistent spontaneous bacterial peritonitis having left without completing his course of therapy.This is his 17th admission to the hospital in 12 months.Patient does frequently leave AMA. Patient was  hospitalized and placed on broad-spectrum antibiotics. He underwent paracentesis. Continues to have high WBC in peritoneal fluid. Infectious disease also consulted.    Hospital Course:   Abdominal pain secondary to recurrent SBP (spontaneous bacterial peritonitis) ([place]) -Strong history of noncompliance.  Underwent paracentesis on 5/20 and 2.1 L were removed, WBC count 1105 with 82% neutrophils.  Cultures so far negative.  Also pending AFB cultures. -ID and GI consulted. -Dr. Graylon Good recommended vancomycin, ceftaz for 10-day course and then ciprofloxacin 750 mg DAILY for SBP prophylaxis.   -repeat paracentesis done today for cytology and ensuring cell counts are trending down, cell count down to 1007, neutrophils 54% -GI recommended lactulose twice daily on non-HD days and daily after hemodialysis on dialysis days, close follow-ups with GI to have outpatient paracentesis arranged.  He has been noncompliant with his GI follow-up visits, appointment scheduled on 6/4 9:30 AM with Nicoletta Ba, PA   Active Problems: ESRD on hemodialysis MWF Seen during dialysis today, no acute issues.    ESLD with liver cirrhosis, hepatitis B and C, hepatic encephalopathy -As #1, will need SBP prophylaxis, lactulose,  -Follow repeat paracentesis today -  Outpatient follow-up with GI scheduled  Erythema over the graft site -Seen by vascular surgery, possible reaction to Gore-Tex rather than infection, continue to monitor  Anemia of chronic kidney disease -H&H currently stable, close to baseline    Malnutrition of moderate degree -Patient counseled on diet, encourage oral intake  History of alcohol abuse -Has not been drinking recently, continue to monitor  Mitral stenosis Asymptomatic    Day of Discharge S: No acute issues, seen during HD, hoping to go home soon  BP 139/67 (BP Location: Left Arm)   Pulse 84   Temp 98.2 F (36.8 C) (Oral)   Resp 18   Ht 5\' 9"  (1.753 m) Comment: 5'  9''  Wt 67.7 kg (149 lb 4 oz)   SpO2 98%   BMI 22.04 kg/m   Physical Exam: General: Alert and awake oriented x3 not in any acute distress. HEENT: anicteric sclera, pupils reactive to light and accommodation CVS: S1-S2 clear no murmur rubs or gallops Chest: clear to auscultation bilaterally, no wheezing rales or rhonchi Abdomen: soft distended, normal bowel sounds Extremities: no cyanosis, clubbing or edema noted bilaterally Neuro: Cranial nerves II-XII intact, no focal neurological deficits   The results of significant diagnostics from this hospitalization (including imaging, microbiology, ancillary and laboratory) are listed below for reference.      Procedures/Studies:  Dg Chest 2 View  Result Date: 11/15/2017 CLINICAL DATA:  Abdominal distention with productive cough. EXAM: CHEST - 2 VIEW COMPARISON:  None. FINDINGS: The heart is enlarged. Dialysis catheter tips project over the RIGHT atrium. Mild vascular congestion without consolidation or edema. No effusion or pneumothorax. No acute osseous findings. IMPRESSION: Cardiomegaly. Mild vascular congestion. No frank edema or consolidation. Electronically Signed   By: Staci Righter M.D.   On: 11/15/2017 19:37   Ct Abdomen Pelvis W Contrast  Result Date: 11/27/2017 CLINICAL DATA:  Acute onset of generalized abdominal pain and distention. EXAM: CT ABDOMEN AND PELVIS WITH CONTRAST TECHNIQUE: Multidetector CT imaging of the abdomen and pelvis was performed using the standard protocol following bolus administration of intravenous contrast. CONTRAST:  164mL OMNIPAQUE IOHEXOL 300 MG/ML  SOLN COMPARISON:  CT of the abdomen and pelvis performed 11/16/2017 FINDINGS: Lower chest: Mild bibasilar atelectasis or scarring is noted. Scattered coronary artery calcifications are seen. Calcification is noted at the mitral valve. Hepatobiliary: The nodular contour of the liver is compatible with hepatic cirrhosis. The gallbladder is difficult to fully assess  given surrounding ascites. The common bile duct remains normal in caliber. Pancreas: The pancreas is within normal limits. Spleen: The spleen is unremarkable in appearance. Adrenals/Urinary Tract: The adrenal glands are unremarkable in appearance. Severe chronic bilateral renal atrophy is noted, with scattered bilateral renal cysts and diffuse bilateral vascular calcification. There is no evidence of hydronephrosis. No obstructing ureteral stones are seen. Stomach/Bowel: The stomach is unremarkable in appearance. The small bowel is within normal limits. The appendix is normal in caliber, without evidence of appendicitis. The colon is unremarkable in appearance. Vascular/Lymphatic: Diffuse calcification is seen along the abdominal aorta and its branches. The abdominal aorta is otherwise grossly unremarkable. The inferior vena cava is grossly unremarkable. No retroperitoneal lymphadenopathy is seen. No pelvic sidewall lymphadenopathy is identified. Reproductive: The bladder is decompressed and not well characterized. The prostate is normal in size. Other: Large volume ascites is noted within the abdomen and pelvis. There is soft tissue density layering dependently within the pelvis. This is of uncertain significance, given that fluid cultures and pathology have been negative. Musculoskeletal: No  acute osseous abnormalities are identified. The visualized musculature is unremarkable in appearance. IMPRESSION: 1. Large volume ascites noted within the abdomen and pelvis, with soft tissue density again noted layering dependently in the pelvis. This is of uncertain significance, given that peritoneal fluid cultures and pathology have been negative. 2. Findings of hepatic cirrhosis. 3. Severe chronic bilateral renal atrophy, with scattered bilateral renal cysts. 4. Scattered coronary artery calcifications seen. 5. Mild bibasilar atelectasis or scarring noted. Aortic Atherosclerosis (ICD10-I70.0). Electronically Signed   By:  Garald Balding M.D.   On: 11/27/2017 04:17   Ct Abdomen Pelvis W Contrast  Result Date: 11/16/2017 CLINICAL DATA:  Chronic periumbilical abdominal pain. Loss of appetite. Dialysis patient. EXAM: CT ABDOMEN AND PELVIS WITH CONTRAST TECHNIQUE: Multidetector CT imaging of the abdomen and pelvis was performed using the standard protocol following bolus administration of intravenous contrast. CONTRAST:  8mL OMNIPAQUE IOHEXOL 300 MG/ML  SOLN COMPARISON:  10/25/2017 FINDINGS: Lower chest: Cardiomegaly.  Lung bases are clear.  No pleural fluid. Hepatobiliary: Cirrhosis as manifest by lobulation of the liver surface and enlargement of the left lobe and caudate lobe. No definable focal mass. No calcified gallstones. Portal vein shows flow. Pancreas: Normal Spleen: Normal size.  No focal lesion. Adrenals/Urinary Tract: No adrenal mass. Both kidneys are small with extensive vascular calcification and a 1 cm cyst at the lower pole on the left. Stomach/Bowel: No bowel pathology discernible. No evidence of obstruction. Clip in the region of the rectum as seen previously as distant as 09/02/2017 Vascular/Lymphatic: Advanced atherosclerosis of the aorta and its branch vessels. No aneurysm. No retroperitoneal mass or adenopathy. Reproductive: Negative Other: Large amount of ascites freely distributed throughout the peritoneal cavity. Musculoskeletal: Chronic degenerative disc disease at L5-S1. Chronic bone changes of renal failure. IMPRESSION: Large amount of ascites, freely distributed. Cirrhosis of the liver without evidence of focal or acute finding. Cardiomegaly. Advanced aortic atherosclerosis and atherosclerosis of the branch vessels. Electronically Signed   By: Nelson Chimes M.D.   On: 11/16/2017 11:52   Dg Chest Port 1 View  Result Date: 11/08/2017 CLINICAL DATA:  Shortness of breath.  Smoker EXAM: PORTABLE CHEST 1 VIEW COMPARISON:  11/04/2017 FINDINGS: Decreased inspiration. Stable enlarged cardiac silhouette. The  right jugular double-lumen catheter tips remain in the right atrium, currently in the mid to lower right atrium. Stable prominence of the interstitial markings and mild prominence of the pulmonary vasculature. Minimal left basilar linear atelectasis and possible small left pleural effusion. Diffuse osteopenia. Atheromatous arterial calcifications. IMPRESSION: 1. Decreased inspiration with stable mild elevation of the left hemidiaphragm and interval minimal left basilar linear atelectasis or scarring and possible small left pleural effusion. 2. Stable cardiomegaly, mild pulmonary vascular congestion and mild chronic interstitial lung disease. Electronically Signed   By: Claudie Revering M.D.   On: 11/08/2017 15:38   Dg Chest Port 1 View  Result Date: 11/04/2017 CLINICAL DATA:  Productive cough with generalized weakness and dyspnea. EXAM: PORTABLE CHEST 1 VIEW COMPARISON:  10/29/2017 FINDINGS: Stable cardiomegaly with aortic atherosclerosis. Mild interstitial edema. No pulmonary consolidation. Left basilar atelectasis is identified with slight elevation of the left hemidiaphragm, chronic in appearance. Right IJ dialysis catheter tip remains in the mid right atrium. No acute osseous abnormality. IMPRESSION: Cardiomegaly with mild interstitial edema. Aortic atherosclerosis. Dialysis catheter tip in the mid right atrium. Electronically Signed   By: Ashley Royalty M.D.   On: 11/04/2017 03:23   Ir Abdomen US Limited  Result Date: 11/09/2017 CLINICAL DATA:  History of cirrhosis with recurrent  symptomatic ascites. Please perform ultrasound-guided paracentesis for therapeutic purposes. EXAM: LIMITED ABDOMEN ULTRASOUND FOR ASCITES TECHNIQUE: Limited ultrasound survey for ascites was performed in all four abdominal quadrants. COMPARISON:  Ascites search ultrasound-11/04/2017; ultrasound-guided paracentesis-10/28/2017; CT abdomen and pelvis - 10/25/2017 FINDINGS: Sonographic evaluation of the abdomen demonstrates a small amount  of predominantly perihepatic intra-abdominal ascites. While the amount of fluid is likely amenable to ultrasound-guided paracentesis, the patient refused the procedure and as such, no procedure was performed. IMPRESSION: Small amount of intra-abdominal ascites, potentially amenable to ultrasound-guided paracentesis however the present time, the patient refuses to undergo the paracentesis Referring physician was made aware of patient's request. Electronically Signed   By: Sandi Mariscal M.D.   On: 11/09/2017 10:20   Ir Abdomen US Limited  Result Date: 11/04/2017 CLINICAL DATA:  Abdominal pain, recurrent abdominal ascites. Paracentesis requested. EXAM: LIMITED ABDOMEN ULTRASOUND FOR ASCITES TECHNIQUE: Limited ultrasound survey for ascites was performed in all four abdominal quadrants. COMPARISON:  10/28/2017 FINDINGS: Survey ultrasound demonstrates a small amount of scattered abdominal ascites. No large pocket to allow safe therapeutic paracentesis. IMPRESSION: Small amount of scattered abdominal ascites. Therapeutic paracentesis deferred. Electronically Signed   By: Lucrezia Europe M.D.   On: 11/04/2017 09:44   Ir Paracentesis  Result Date: 11/28/2017 INDICATION: Ascites EXAM: ULTRASOUND GUIDED  PARACENTESIS MEDICATIONS: None. COMPLICATIONS: None immediate. PROCEDURE: Informed written consent was obtained from the patient after a discussion of the risks, benefits and alternatives to treatment. A timeout was performed prior to the initiation of the procedure. Initial ultrasound scanning demonstrates a large amount of ascites within the right lower abdominal quadrant. The right lower abdomen was prepped and draped in the usual sterile fashion. 1% lidocaine with epinephrine was used for local anesthesia. Following this, a 6 Fr Safe-T-Centesis catheter was introduced. An ultrasound image was saved for documentation purposes. The paracentesis was performed. The catheter was removed and a dressing was applied. The patient  tolerated the procedure well without immediate post procedural complication. FINDINGS: A total of approximately 2.1 L of dark clear fluid was removed. Samples were sent to the laboratory as requested by the clinical team. IMPRESSION: Successful ultrasound-guided paracentesis yielding 2.1 liters of peritoneal fluid. Electronically Signed   By: Marybelle Killings M.D.   On: 11/28/2017 12:46   Ir Paracentesis  Result Date: 11/16/2017 INDICATION: Recurrent ascites EXAM: ULTRASOUND-GUIDED PARACENTESIS COMPARISON:  Previous paracentesis. MEDICATIONS: 10 cc 2% lidocaine. COMPLICATIONS: None immediate. TECHNIQUE: Informed written consent was obtained from the patient after a discussion of the risks, benefits and alternatives to treatment. A timeout was performed prior to the initiation of the procedure. Initial ultrasound scanning demonstrates a large amount of ascites within the left lower abdominal quadrant. The right lower abdomen was prepped and draped in the usual sterile fashion. 1% lidocaine with epinephrine was used for local anesthesia. Under direct ultrasound guidance, a 19 gauge, 7-cm, Yueh catheter was introduced. An ultrasound image was saved for documentation purposed. The paracentesis was performed. The catheter was removed and a dressing was applied. The patient tolerated the procedure well without immediate post procedural complication. FINDINGS: A total of approximately 2.1 liters of yellow fluid was removed. Samples were sent to the laboratory as requested by the clinical team. IMPRESSION: Successful ultrasound-guided paracentesis yielding 2.1 liters of peritoneal fluid. Read by Lavonia Drafts [facility] Electronically Signed   By: Marybelle Killings M.D.   On: 11/16/2017 15:48   Ir Paracentesis  Result Date: 11/09/2017 INDICATION: Recurrent symptomatic ascites. Please perform ultrasound-guided paracentesis for diagnostic and therapeutic  purposes. EXAM: ULTRASOUND-GUIDED PARACENTESIS COMPARISON:  Ultrasound-guided  paracentesis - 10/28/2017 CT abdomen and pelvis - 10/25/2017 MEDICATIONS: None. COMPLICATIONS: None immediate. TECHNIQUE: Informed written consent was obtained from the patient after a discussion of the risks, benefits and alternatives to treatment. A timeout was performed prior to the initiation of the procedure. Initial ultrasound scanning demonstrates a small to moderate amount of ascites within the right lower abdominal quadrant. The right lower abdomen was prepped and draped in the usual sterile fashion. 1% lidocaine with epinephrine was used for local anesthesia. Under direct ultrasound guidance, a 19 gauge, 7-cm, Yueh catheter was introduced. An ultrasound image was saved for documentation purposed. The paracentesis was performed. The catheter was removed and a dressing was applied. The patient tolerated the procedure well without immediate post procedural complication. FINDINGS: A total of approximately 3 liters of serous fluid was removed. Samples were sent to the laboratory as requested by the clinical team. IMPRESSION: Successful ultrasound-guided paracentesis yielding 3 liters of peritoneal fluid. Electronically Signed   By: Sandi Mariscal M.D.   On: 11/09/2017 15:43      LAB RESULTS: Basic Metabolic Panel: Recent Labs  Lab 11/29/17 0755 12/01/17 0700  NA 133* 133*  K 5.3* 5.2*  CL 92* 94*  CO2 27 28  GLUCOSE 90 122*  BUN 42* 27*  CREATININE 9.07* 6.69*  CALCIUM 8.5* 9.0  PHOS 4.9* 3.4   Liver Function Tests: Recent Labs  Lab 11/26/17 2133 11/29/17 0755 12/01/17 0700  AST 27  --   --   ALT 9*  --   --   ALKPHOS 213*  --   --   BILITOT 0.7  --   --   PROT 6.5  --   --   ALBUMIN 2.6* 2.3* 2.4*   Recent Labs  Lab 11/26/17 2133  LIPASE 24   Recent Labs  Lab 11/27/17 0225  AMMONIA 23   CBC: Recent Labs  Lab 11/29/17 0725 12/01/17 0710  WBC 7.8 7.3  HGB 7.7* 7.9*  HCT 23.5* 24.6*  MCV 73.4* 75.2*  PLT 231 234   Cardiac Enzymes: No results for input(s):  CKTOTAL, CKMB, CKMBINDEX, TROPONINI in the last 168 hours. BNP: Invalid input(s): POCBNP CBG: Recent Labs  Lab 11/26/17 2344  GLUCAP 106*      Disposition and Follow-up:    DISPOSITION: Home   DISCHARGE FOLLOW-UP Follow-up Information    Esterwood, Amy S, PA-C Follow up on 12/13/2017.   Specialty:  Gastroenterology Why:  9:30 AM follow up for abdominal swelling and liver disease.   Contact information: [address] [place] [address] [phone]        [name], MD. Schedule an appointment as soon as possible for a visit in 2 week(s).   Specialty:  Internal Medicine Contact information: [address] [place] [address] [phone]            Time coordinating discharge:  45 mins   Signed:   Estill Cotta M.D. [facility] 12/01/2017, 12:24 PM Pager: [phone]

## 2017-12-01 NOTE — Progress Notes (Signed)
Subjective:  Had HD today 1500 ml uf  Tolerated / Paracentesis today  1.9 liter / no cos  Noted for dc home   Objective Vital signs in last 24 hours: Vitals:   12/01/17 0900 12/01/17 0930 12/01/17 1000 12/01/17 1009  BP: 135/86 (!) 167/79 (!) 148/63 139/67  Pulse: 91 93 85 84  Resp:    18  Temp:    98.2 F (36.8 C)  TempSrc:    Oral  SpO2:    98%  Weight:    67.7 kg (149 lb 4 oz)  Height:       Weight change: -2.897 kg (-6 lb 6.2 oz)   Physical Exam General:thin chronically ill male,NAD Lungs:CTA bilat  Heart:RRR no m, r, g Abdomen:distended + ascites,nontender / + bs Lower extremities:withoutovertedema or ischemic changes, no open wounds  Dialysis Access:right IJ TDC and right upper arm  AVGG + bruit  Dialysis Orders:[facility] M[facility] 4 hr 400/800 EDW 69 2K 2 Ca TDCand right upper AVGGno heparin parsabiv 5/mircera100 q 2wks Last on calcitriol 0.5  Isolation HD due to Hep B +   Problem/Plan: 1. SBP - admit team wu with  ID  Dr Wilder Glade PD cul neg , 1.105 wbcs',82% Neutrophils with sp 2.1 paracentesis 5/20  /  and today 1.9 l paracentesis  10 day of vancomycin and ceftaz  Then Cipro 500 mg qd prophylaxis /  GI follow up for arrange  paracentesis if needed  2. ESRD- MWF - HDtoday- needs isolation due to Hep B status -  3. Hypertension/volume- BP upon admit / better with med's and sp paracent -. Hd Today 1.5 l uf  Wt to 67.7 pre paracentesis  So will aim at edw down to 67.5  kg / -( had 1.9 liter off with Paracent.  4. Anemia- hgb . 7.9  146mcg Aranesp given  Today , incr to 150 at op   5. Metabolic bone disease- Continue calcitriol -/ renvela binder 6. Hx cirrhosis/chronic ascites/chronic lactulose/Hep B and C- GI saw in hosp/  See consult/  And fu as op  Has 12/13/17  Apt with GI  7. Hx polusubstance abuse 8. Hx medical noncompliance-New right upper AVGG ( 11/22/17 insert) - stable now - seen by [facility]  9. Poor social support- 68 th  admit in 13 months    Ernest Haber, PA-C [place] [phone] 12/01/2017,12:01 PM  LOS: 4 days   Labs: Basic Metabolic Panel: Recent Labs  Lab 11/28/17 0455 11/29/17 0755 12/01/17 0700  NA 133* 133* 133*  K 5.1 5.3* 5.2*  CL 91* 92* 94*  CO2 23 27 28   GLUCOSE 114* 90 122*  BUN 37* 42* 27*  CREATININE 8.26* 9.07* 6.69*  CALCIUM 8.5* 8.5* 9.0  PHOS  --  4.9* 3.4   Liver Function Tests: Recent Labs  Lab 11/26/17 2133 11/29/17 0755 12/01/17 0700  AST 27  --   --   ALT 9*  --   --   ALKPHOS 213*  --   --   BILITOT 0.7  --   --   PROT 6.5  --   --   ALBUMIN 2.6* 2.3* 2.4*   Recent Labs  Lab 11/26/17 2133  LIPASE 24   Recent Labs  Lab 11/27/17 0225  AMMONIA 23   CBC: Recent Labs  Lab 11/26/17 2133 11/28/17 0455 11/29/17 0725 12/01/17 0710  WBC 12.3* 10.5 7.8 7.3  HGB 7.9* 8.2* 7.7* 7.9*  HCT 24.2* 25.2* 23.5* 24.6*  MCV 73.1* 75.7*  73.4* 75.2*  PLT 204 227 231 234   Cardiac Enzymes: No results for input(s): CKTOTAL, CKMB, CKMBINDEX, TROPONINI in the last 168 hours. CBG: Recent Labs  Lab 11/26/17 2344  GLUCAP 106*    Studies/Results: No results found. Medications: . cefTAZidime (FORTAZ)  IV    . [START ON 12/02/2017] cefTAZidime (FORTAZ)  IV    . vancomycin    . [START ON 12/02/2017] vancomycin     . calcitRIOL  0.5 mcg Oral Q M,W,F-HD  . clopidogrel  75 mg Oral Daily  . darbepoetin (ARANESP) injection - DIALYSIS  100 mcg Intravenous Q Wed-HD  . diltiazem  120 mg Oral Daily  . enoxaparin (LOVENOX) injection  30 mg Subcutaneous Q24H  . famotidine  20 mg Oral QHS  . feeding supplement (NEPRO CARB STEADY)  237 mL Oral BID BM  . fluticasone  2 spray Each Nare Daily  . gabapentin  100 mg Oral BID  . isosorbide mononitrate  30 mg Oral Daily  . lactulose  20 g Oral BID  . lidocaine      . loratadine  10 mg Oral Daily  . mometasone-formoterol  2 puff Inhalation BID  . nicotine  14 mg Transdermal Daily  .  pantoprazole  40 mg Oral Daily  . sevelamer carbonate  1,600 mg Oral With snacks  . sevelamer carbonate  3,200 mg Oral TID WC  . tiotropium  18 mcg Inhalation Daily

## 2017-12-01 NOTE — Progress Notes (Signed)
Patient discharged to Home . After visit Summary reviewed. Patient capable of reverbalizing medications and follow up visits. No signs and symptoms of distress noted. Patient educated to return to the ED in the case of an emergency. Konstantin Lehnen RN 

## 2017-12-01 NOTE — Progress Notes (Addendum)
Pharmacy Antibiotic Note  [name] is a 57 y.o. male admitted on 11/26/2017 with abdominal pain.  Pharmacy has been consulted for vancomycin and ceftazidime dosing for bacterial peritonitis.   Patient has ESRD on MWF HD- last HD off schedule on 5/21- tolerated 4h @ BFR 41mL/min. Has not missed doses of antibiotics. Was to have shorter session 5/22, but this was moved to today.   Plan: Vancomycin 500mg  IV x1 today with shorter HD (already given), then resume qHD MWF on 5/24 Ceftazadime 1g IV x1 this evening due to shorted HD session, then resume 2g IV MWF @ 2000 on 5/24 Plan is for 10 total days of IV antibiotics- last day of therapy will be Monday 5/27 Monitor HD schedule/tolerance, clinical progress, vanc level as indicated Noted ID recommends ciprofloxacin for prophylaxis- proper dose for HD patient is 500mg  qhs (so as to give medication after HD on dialysis days)  Height: 5\' 9"  (175.3 cm)(5' 9'') Weight: 153 lb (69.4 kg) IBW/kg (Calculated) : 70.7  Temp (24hrs), Avg:98.5 F (36.9 C), Min:98.3 F (36.8 C), Max:98.8 F (37.1 C)  Recent Labs  Lab 11/26/17 2133 11/26/17 2140 11/26/17 2327 11/28/17 0455 11/29/17 0725 11/29/17 0755 12/01/17 0700 12/01/17 0710  WBC 12.3*  --   --  10.5 7.8  --   --  7.3  CREATININE 6.73*  --   --  8.26*  --  9.07* 6.69*  --   LATICACIDVEN  --  1.56 1.01  --   --   --   --   --     Estimated Creatinine Clearance: 12.2 mL/min (A) (by C-G formula based on SCr of 6.69 mg/dL (H)).    Allergies  Allergen Reactions  . Aspirin Other (See Comments)    STOMACH PAIN  . Clonidine Derivatives Itching  . Tramadol Itching  . Tylenol [Acetaminophen] Nausea Only    Stomach ache   Ceftriaxone 5/19 x1 Vancomycin 5/19 >> [name] 5/19 >>  5/19 BCx - ngtd 5/20 peritoneal fluid - ngtd 5/20 [employer] - pending  Geneve Kimpel D. Terriyah Westra, PharmD, BCPS Clinical Pharmacist [phone] 12/01/2017 9:15 AM

## 2017-12-01 NOTE — Progress Notes (Signed)
[facility]                                                                              Patient Demographics  [name], is a 57 y.o. male, DOB - 1960/10/29, HUT:[id number]  Admit date - 11/26/2017   Admitting Physician [name], MD  Outpatient Primary MD for the patient is [name], MD  Outpatient specialists:   LOS - 4  days   Medical records reviewed and are as summarized below:    Chief Complaint  Patient presents with  . Arm Pain  . 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 He was recently hospitalized from 5/7 to 11/19/2017. Diagnoses at discharge was persistent spontaneous bacterial peritonitis complicated by sepsis.On May 4 he had left the hospital [place] but presented back to the emergency department on May 7with worsening abdominal pain which was constant and severe in intensity. He was treated for persistent spontaneous bacterial peritonitis having left without completing his course of therapy.This is his 17th admission to the hospital in 12 months.Patient does frequently leave AMA.  Patient was hospitalized and placed on broad-spectrum antibiotics.  He underwent paracentesis.  Continues to have high WBC in peritoneal fluid.  Infectious disease also consulted. Assessment & Plan    Principal Problem: Abdominal pain secondary to recurrent SBP (spontaneous bacterial peritonitis) ([place]) -Strong history of noncompliance.  Underwent paracentesis on 5/20 and 2.1 L were removed, WBC count 1105 with  82% neutrophils.  Cultures so far negative.  Also pending AFB cultures. -ID and GI consulted. -Dr. Graylon Good recommended vancomycin, ceftaz for 10-day course and then ciprofloxacin 750 mg DAILY for SBP prophylaxis.   -will repeat paracentesis today for cytology and ensuring cell counts are trending down -GI recommended lactulose twice daily on non-HD days and daily after hemodialysis on dialysis days, close follow-ups with GI to have outpatient paracentesis arranged.  He has been noncompliant with his GI follow-up visits, appointment scheduled on 6/4 9:30 AM with Nicoletta Ba, PA   Active Problems: ESRD on hemodialysis MWF Seen during dialysis today, no acute issues.    ESLD with liver cirrhosis, hepatitis B and C, hepatic encephalopathy -As #1, will need SBP prophylaxis, lactulose,  -Follow repeat paracentesis today - Outpatient follow-up with GI scheduled  Erythema over the graft site -Seen by vascular surgery, possible reaction to Gore-Tex rather than infection, continue to monitor  Anemia of chronic kidney disease -H&H currently stable, close to baseline    Malnutrition of moderate degree -Patient counseled on diet, encourage oral intake  History of alcohol abuse -Has not been drinking recently, continue to monitor  Mitral stenosis Asymptomatic  Code Status: full  DVT Prophylaxis:  Lovenox  Family Communication: Discussed in detail with the patient, all imaging results, lab results explained to the patient  Disposition Plan: Pending HD and paracentesis today  Time Spent in minutes  25 minutes  Procedures:  Paracentesis, HD Consultants:   Nephrology GI ID  Antimicrobials:      Medications  Scheduled Meds: . calcitRIOL  0.5 mcg Oral Q M,W,F-HD  . clopidogrel  75 mg Oral Daily  . darbepoetin (ARANESP) injection - DIALYSIS  100 mcg Intravenous Q Wed-HD  . diltiazem  120 mg Oral Daily  . enoxaparin (LOVENOX) injection  30 mg Subcutaneous Q24H  . famotidine  20  mg Oral QHS  . feeding supplement (NEPRO CARB STEADY)  237 mL Oral BID BM  . fluticasone  2 spray Each Nare Daily  . gabapentin  100 mg Oral BID  . isosorbide mononitrate  30 mg Oral Daily  . lactulose  20 g Oral BID  . lidocaine      . loratadine  10 mg Oral Daily  . mometasone-formoterol  2 puff Inhalation BID  . nicotine  14 mg Transdermal Daily  . pantoprazole  40 mg Oral Daily  . sevelamer carbonate  1,600 mg Oral With snacks  . sevelamer carbonate  3,200 mg Oral TID WC  . tiotropium  18 mcg Inhalation Daily   Continuous Infusions: . cefTAZidime (FORTAZ)  IV    . [START ON 12/02/2017] cefTAZidime (FORTAZ)  IV    . vancomycin    . [START ON 12/02/2017] vancomycin     PRN Meds:.albuterol, ALPRAZolam, hydrALAZINE, ibuprofen, ketorolac, morphine injection, ondansetron **OR** ondansetron (ZOFRAN) IV, oxyCODONE, polyethylene glycol   Antibiotics   Anti-infectives (From admission, onward)   Start     Dose/Rate Route Frequency Ordered Stop   12/02/17 2000  cefTAZidime (FORTAZ) 2 g in sodium chloride 0.9 % 100 mL IVPB     2 g 200 mL/hr over 30 Minutes Intravenous Every M-W-F (2000) 12/01/17 0914     12/02/17 1200  vancomycin (VANCOCIN) IVPB 750 mg/150 ml premix     750 mg 150 mL/hr over 60 Minutes Intravenous Every M-W-F (Hemodialysis) 11/30/17 1110     12/01/17 2000  cefTAZidime (FORTAZ) 1 g in sodium chloride 0.9 % 100 mL IVPB     1 g 200 mL/hr over 30 Minutes Intravenous  Once 12/01/17 0914     12/01/17 0844  vancomycin (VANCOCIN) 500-5 MG/100ML-% IVPB    Note to Pharmacy:  Ashley Akin   : cabinet override      12/01/17 0844 12/01/17 0900   11/30/17 1200  cefTAZidime (FORTAZ) 2 g in sodium chloride 0.9 % 100 mL IVPB  Status:  Discontinued     2 g 200 mL/hr over 30 Minutes Intravenous Every M-W-F (Hemodialysis) 11/29/17 2156 12/01/17 0914   11/30/17 1200  vancomycin (VANCOCIN) 500 mg in sodium chloride 0.9 % 100 mL IVPB     500 mg 100 mL/hr over 60 Minutes Intravenous  Every M-W-F (Hemodialysis) 11/30/17 1110 12/02/17 1159   11/29/17 2000  cefTAZidime (FORTAZ) 2 g in sodium chloride 0.9 % 100 mL IVPB  Status:  Discontinued     2 g 200 mL/hr over 30 Minutes Intravenous  Once 11/29/17 0927 11/29/17 2136   11/29/17 1200  vancomycin (VANCOCIN) IVPB 750 mg/150 ml premix     750 mg 150 mL/hr over 60 Minutes Intravenous Every T-Th-Sa (Hemodialysis) 11/29/17 1100 11/29/17 1215   11/29/17 0954  Vancomycin (VANCOCIN) 750-5 MG/150ML-% IVPB    Note to Pharmacy:  Almira Bar   : cabinet override      11/29/17 0954 11/29/17 1023   11/28/17 2000  cefTAZidime (FORTAZ) 2 g in sodium chloride 0.9 % 100 mL IVPB  Status:  Discontinued     2 g 200 mL/hr over 30 Minutes Intravenous Every M-W-F (2000) 11/28/17 1058 11/29/17 2104   11/28/17 1200  vancomycin (VANCOCIN) IVPB 750 mg/150 ml premix  Status:  Discontinued     750 mg 150 mL/hr over 60 Minutes Intravenous Every M-W-F (Hemodialysis) 11/27/17 1019 11/30/17 1110   11/28/17 1200  cefTAZidime (FORTAZ) 2 g in sodium chloride 0.9 % 100 mL IVPB  Status:  Discontinued     2 g 200 mL/hr over 30 Minutes Intravenous Every M-W-F (Hemodialysis) 11/27/17 1019 11/28/17 1058   11/27/17 1400  cefTAZidime (FORTAZ) 1 g in sodium chloride 0.9 % 100 mL IVPB  Status:  Discontinued     1 g 200 mL/hr over 30 Minutes Intravenous Every 8 hours 11/27/17 1005 11/27/17 1013   11/27/17 1015  cefTAZidime (FORTAZ) 2 g in sodium chloride 0.9 % 100 mL IVPB     2 g 200 mL/hr over 30 Minutes Intravenous NOW 11/27/17 1013 11/27/17 1146   11/27/17 0845  Ceftazidime (FORTAZ) injection 2 g  Status:  Discontinued    Note to Pharmacy:  To receive on hemodialysis, last dose 11/23/2017.     2 g Intravenous Every 48 hours 11/27/17 0831 11/27/17 0838   11/27/17 0515  vancomycin (VANCOCIN) 1,500 mg in sodium chloride 0.9 % 500 mL IVPB     1,500 mg 250 mL/hr over 120 Minutes Intravenous  Once 11/27/17 0500 11/27/17 0754   11/27/17 0500  cefTRIAXone  (ROCEPHIN) 2 g in sodium chloride 0.9 % 100 mL IVPB     2 g 200 mL/hr over 30 Minutes Intravenous  Once 11/27/17 0451 11/27/17 0548        Subjective:   [name] was seen and examined today.  During HD, no new complaints.  Patient denies dizziness, chest pain, shortness of breath.  No acute events overnight.    Objective:   Vitals:   12/01/17 0900 12/01/17 0930 12/01/17 1000 12/01/17 1009  BP: 135/86 (!) 167/79 (!) 148/63 139/67  Pulse: 91 93 85 84  Resp:    18  Temp:    98.2 F (36.8 C)  TempSrc:    Oral  SpO2:    98%  Weight:    67.7 kg (149 lb 4 oz)  Height:        Intake/Output Summary (Last 24 hours) at 12/01/2017 1209 Last data filed at 12/01/2017 1009 Gross per 24 hour  Intake 120 ml  Output 1500 ml  Net -1380 ml     Wt Readings from Last 3 Encounters:  12/01/17 67.7 kg (149 lb 4 oz)  11/19/17 68.5 kg (151 lb 0.2 oz)  11/12/17 75.8 kg (167 lb 1.7 oz)     Exam   General: Alert and oriented x 3, NAD  Eyes:  HEENT:    Cardiovascular: S1 S2 auscultated,  Regular rate and rhythm. N pedal edema b/l  Respiratory: Clear to auscultation bilaterally, no wheezing, rales or rhonchi  Gastrointestinal: Soft, distended, soft , + bowel sounds  Ext: no pedal edema bilaterally  Neuro: no new deficit  Musculoskeletal: No digital cyanosis, clubbing  Skin: No rashes  Psych: Normal affect and demeanor, alert and oriented x3    Data Reviewed:  I have personally reviewed following labs and imaging studies  Micro Results Recent Results (from the past 240 hour(s))  Surgical pcr screen     Status: None   Collection Time:  11/21/17  5:06 PM  Result Value Ref Range Status   MRSA, PCR NEGATIVE NEGATIVE Final   Staphylococcus aureus NEGATIVE NEGATIVE Final    Comment: (NOTE) The Xpert SA Assay (FDA approved for NASAL specimens in patients 6 years of age and older), is one component of a comprehensive surveillance program. It is not intended to diagnose  infection nor to guide or monitor treatment. Performed at [facility], [place] [address], [place], [place] [address]   Culture, blood (Routine X 2) w Reflex to ID Panel     Status: None (Preliminary result)   Collection Time: 11/27/17  2:25 AM  Result Value Ref Range Status   Specimen Description BLOOD LEFT FOREARM  Final   Special Requests   Final    BOTTLES DRAWN AEROBIC AND ANAEROBIC Blood Culture adequate volume   Culture   Final    NO GROWTH 3 DAYS Performed at [facility], [address], [place], [place] [address]    Report Status PENDING  Incomplete  Culture, blood (Routine X 2) w Reflex to ID Panel     Status: None (Preliminary result)   Collection Time: 11/27/17  5:00 AM  Result Value Ref Range Status   Specimen Description BLOOD LEFT FOREARM  Final   Special Requests   Final    BOTTLES DRAWN AEROBIC AND ANAEROBIC Blood Culture results may not be optimal due to an inadequate volume of blood received in culture bottles   Culture   Final    NO GROWTH 3 DAYS Performed at [facility], [place] [address] [place], [place] [address]    Report Status PENDING  Incomplete  Gram stain     Status: None   Collection Time: 11/28/17  1:22 PM  Result Value Ref Range Status   Specimen Description PERITONEAL  Final   Special Requests NONE  Final   Gram Stain   Final    ABUNDANT WBC PRESENT,BOTH PMN AND MONONUCLEAR NO ORGANISMS SEEN Performed at [facility], [address] [place] [address] [place], [place] [address]    Report Status 11/28/2017 FINAL  Final  Culture, body fluid-bottle     Status: None (Preliminary result)   Collection Time: 11/28/17  1:22 PM  Result Value Ref Range Status   Specimen Description PERITONEAL  Final   Special Requests NONE  Final   Culture   Final    NO GROWTH 2 DAYS Performed at [place] [address], [place], [place] [address]    Report Status PENDING  Incomplete  Acid Fast Smear (AFB)     Status: None   Collection Time:  11/28/17  1:22 PM  Result Value Ref Range Status   AFB Specimen Processing Concentration  Final   Acid Fast Smear Negative  Final    Comment: (NOTE) Performed At: [facility] [place], [place] [id number] Rush Farmer MD LN:[id number]    Source (AFB) PERITONEAL  Final    Comment: Performed at [facility], [place] [address] [place], [place] [address]    Radiology Reports Dg Chest 2 View  Result Date: 11/15/2017 CLINICAL DATA:  Abdominal distention with productive cough. EXAM: CHEST - 2 VIEW COMPARISON:  None. FINDINGS: The heart is enlarged. Dialysis catheter tips project over the RIGHT atrium. Mild vascular congestion without consolidation or edema. No effusion or pneumothorax. No acute osseous findings. IMPRESSION: Cardiomegaly. Mild vascular congestion. No frank edema or consolidation. Electronically Signed   By: Staci Righter  M.D.   On: 11/15/2017 19:37   Ct Abdomen Pelvis W Contrast  Result Date: 11/27/2017 CLINICAL DATA:  Acute onset of generalized abdominal pain and distention. EXAM: CT ABDOMEN AND PELVIS WITH CONTRAST TECHNIQUE: Multidetector CT imaging of the abdomen and pelvis was performed using the standard protocol following bolus administration of intravenous contrast. CONTRAST:  162mL OMNIPAQUE IOHEXOL 300 MG/ML  SOLN COMPARISON:  CT of the abdomen and pelvis performed 11/16/2017 FINDINGS: Lower chest: Mild bibasilar atelectasis or scarring is noted. Scattered coronary artery calcifications are seen. Calcification is noted at the mitral valve. Hepatobiliary: The nodular contour of the liver is compatible with hepatic cirrhosis. The gallbladder is difficult to fully assess given surrounding ascites. The common bile duct remains normal in caliber. Pancreas: The pancreas is within normal limits. Spleen: The spleen is unremarkable in appearance. Adrenals/Urinary Tract: The adrenal glands are unremarkable in appearance. Severe chronic bilateral renal atrophy  is noted, with scattered bilateral renal cysts and diffuse bilateral vascular calcification. There is no evidence of hydronephrosis. No obstructing ureteral stones are seen. Stomach/Bowel: The stomach is unremarkable in appearance. The small bowel is within normal limits. The appendix is normal in caliber, without evidence of appendicitis. The colon is unremarkable in appearance. Vascular/Lymphatic: Diffuse calcification is seen along the abdominal aorta and its branches. The abdominal aorta is otherwise grossly unremarkable. The inferior vena cava is grossly unremarkable. No retroperitoneal lymphadenopathy is seen. No pelvic sidewall lymphadenopathy is identified. Reproductive: The bladder is decompressed and not well characterized. The prostate is normal in size. Other: Large volume ascites is noted within the abdomen and pelvis. There is soft tissue density layering dependently within the pelvis. This is of uncertain significance, given that fluid cultures and pathology have been negative. Musculoskeletal: No acute osseous abnormalities are identified. The visualized musculature is unremarkable in appearance. IMPRESSION: 1. Large volume ascites noted within the abdomen and pelvis, with soft tissue density again noted layering dependently in the pelvis. This is of uncertain significance, given that peritoneal fluid cultures and pathology have been negative. 2. Findings of hepatic cirrhosis. 3. Severe chronic bilateral renal atrophy, with scattered bilateral renal cysts. 4. Scattered coronary artery calcifications seen. 5. Mild bibasilar atelectasis or scarring noted. Aortic Atherosclerosis (ICD10-I70.0). Electronically Signed   By: Garald Balding M.D.   On: 11/27/2017 04:17   Ct Abdomen Pelvis W Contrast  Result Date: 11/16/2017 CLINICAL DATA:  Chronic periumbilical abdominal pain. Loss of appetite. Dialysis patient. EXAM: CT ABDOMEN AND PELVIS WITH CONTRAST TECHNIQUE: Multidetector CT imaging of the abdomen  and pelvis was performed using the standard protocol following bolus administration of intravenous contrast. CONTRAST:  37mL OMNIPAQUE IOHEXOL 300 MG/ML  SOLN COMPARISON:  10/25/2017 FINDINGS: Lower chest: Cardiomegaly.  Lung bases are clear.  No pleural fluid. Hepatobiliary: Cirrhosis as manifest by lobulation of the liver surface and enlargement of the left lobe and caudate lobe. No definable focal mass. No calcified gallstones. Portal vein shows flow. Pancreas: Normal Spleen: Normal size.  No focal lesion. Adrenals/Urinary Tract: No adrenal mass. Both kidneys are small with extensive vascular calcification and a 1 cm cyst at the lower pole on the left. Stomach/Bowel: No bowel pathology discernible. No evidence of obstruction. Clip in the region of the rectum as seen previously as distant as 09/02/2017 Vascular/Lymphatic: Advanced atherosclerosis of the aorta and its branch vessels. No aneurysm. No retroperitoneal mass or adenopathy. Reproductive: Negative Other: Large amount of ascites freely distributed throughout the peritoneal cavity. Musculoskeletal: Chronic degenerative disc disease at L5-S1. Chronic bone changes of  renal failure. IMPRESSION: Large amount of ascites, freely distributed. Cirrhosis of the liver without evidence of focal or acute finding. Cardiomegaly. Advanced aortic atherosclerosis and atherosclerosis of the branch vessels. Electronically Signed   By: Nelson Chimes M.D.   On: 11/16/2017 11:52   Dg Chest Port 1 View  Result Date: 11/08/2017 CLINICAL DATA:  Shortness of breath.  Smoker EXAM: PORTABLE CHEST 1 VIEW COMPARISON:  11/04/2017 FINDINGS: Decreased inspiration. Stable enlarged cardiac silhouette. The right jugular double-lumen catheter tips remain in the right atrium, currently in the mid to lower right atrium. Stable prominence of the interstitial markings and mild prominence of the pulmonary vasculature. Minimal left basilar linear atelectasis and possible small left pleural  effusion. Diffuse osteopenia. Atheromatous arterial calcifications. IMPRESSION: 1. Decreased inspiration with stable mild elevation of the left hemidiaphragm and interval minimal left basilar linear atelectasis or scarring and possible small left pleural effusion. 2. Stable cardiomegaly, mild pulmonary vascular congestion and mild chronic interstitial lung disease. Electronically Signed   By: Claudie Revering M.D.   On: 11/08/2017 15:38   Dg Chest Port 1 View  Result Date: 11/04/2017 CLINICAL DATA:  Productive cough with generalized weakness and dyspnea. EXAM: PORTABLE CHEST 1 VIEW COMPARISON:  10/29/2017 FINDINGS: Stable cardiomegaly with aortic atherosclerosis. Mild interstitial edema. No pulmonary consolidation. Left basilar atelectasis is identified with slight elevation of the left hemidiaphragm, chronic in appearance. Right IJ dialysis catheter tip remains in the mid right atrium. No acute osseous abnormality. IMPRESSION: Cardiomegaly with mild interstitial edema. Aortic atherosclerosis. Dialysis catheter tip in the mid right atrium. Electronically Signed   By: Ashley Royalty M.D.   On: 11/04/2017 03:23   Ir Abdomen US Limited  Result Date: 11/09/2017 CLINICAL DATA:  History of cirrhosis with recurrent symptomatic ascites. Please perform ultrasound-guided paracentesis for therapeutic purposes. EXAM: LIMITED ABDOMEN ULTRASOUND FOR ASCITES TECHNIQUE: Limited ultrasound survey for ascites was performed in all four abdominal quadrants. COMPARISON:  Ascites search ultrasound-11/04/2017; ultrasound-guided paracentesis-10/28/2017; CT abdomen and pelvis - 10/25/2017 FINDINGS: Sonographic evaluation of the abdomen demonstrates a small amount of predominantly perihepatic intra-abdominal ascites. While the amount of fluid is likely amenable to ultrasound-guided paracentesis, the patient refused the procedure and as such, no procedure was performed. IMPRESSION: Small amount of intra-abdominal ascites, potentially amenable  to ultrasound-guided paracentesis however the present time, the patient refuses to undergo the paracentesis Referring physician was made aware of patient's request. Electronically Signed   By: Sandi Mariscal M.D.   On: 11/09/2017 10:20   Ir Abdomen US Limited  Result Date: 11/04/2017 CLINICAL DATA:  Abdominal pain, recurrent abdominal ascites. Paracentesis requested. EXAM: LIMITED ABDOMEN ULTRASOUND FOR ASCITES TECHNIQUE: Limited ultrasound survey for ascites was performed in all four abdominal quadrants. COMPARISON:  10/28/2017 FINDINGS: Survey ultrasound demonstrates a small amount of scattered abdominal ascites. No large pocket to allow safe therapeutic paracentesis. IMPRESSION: Small amount of scattered abdominal ascites. Therapeutic paracentesis deferred. Electronically Signed   By: Lucrezia Europe M.D.   On: 11/04/2017 09:44   Ir Paracentesis  Result Date: 11/28/2017 INDICATION: Ascites EXAM: ULTRASOUND GUIDED  PARACENTESIS MEDICATIONS: None. COMPLICATIONS: None immediate. PROCEDURE: Informed written consent was obtained from the patient after a discussion of the risks, benefits and alternatives to treatment. A timeout was performed prior to the initiation of the procedure. Initial ultrasound scanning demonstrates a large amount of ascites within the right lower abdominal quadrant. The right lower abdomen was prepped and draped in the usual sterile fashion. 1% lidocaine with epinephrine was used for local anesthesia. Following this, a  6 Fr Safe-T-Centesis catheter was introduced. An ultrasound image was saved for documentation purposes. The paracentesis was performed. The catheter was removed and a dressing was applied. The patient tolerated the procedure well without immediate post procedural complication. FINDINGS: A total of approximately 2.1 L of dark clear fluid was removed. Samples were sent to the laboratory as requested by the clinical team. IMPRESSION: Successful ultrasound-guided paracentesis  yielding 2.1 liters of peritoneal fluid. Electronically Signed   By: Marybelle Killings M.D.   On: 11/28/2017 12:46   Ir Paracentesis  Result Date: 11/16/2017 INDICATION: Recurrent ascites EXAM: ULTRASOUND-GUIDED PARACENTESIS COMPARISON:  Previous paracentesis. MEDICATIONS: 10 cc 2% lidocaine. COMPLICATIONS: None immediate. TECHNIQUE: Informed written consent was obtained from the patient after a discussion of the risks, benefits and alternatives to treatment. A timeout was performed prior to the initiation of the procedure. Initial ultrasound scanning demonstrates a large amount of ascites within the left lower abdominal quadrant. The right lower abdomen was prepped and draped in the usual sterile fashion. 1% lidocaine with epinephrine was used for local anesthesia. Under direct ultrasound guidance, a 19 gauge, 7-cm, Yueh catheter was introduced. An ultrasound image was saved for documentation purposed. The paracentesis was performed. The catheter was removed and a dressing was applied. The patient tolerated the procedure well without immediate post procedural complication. FINDINGS: A total of approximately 2.1 liters of yellow fluid was removed. Samples were sent to the laboratory as requested by the clinical team. IMPRESSION: Successful ultrasound-guided paracentesis yielding 2.1 liters of peritoneal fluid. Read by [facility] Electronically Signed   By: Marybelle Killings M.D.   On: 11/16/2017 15:48   Ir Paracentesis  Result Date: 11/09/2017 INDICATION: Recurrent symptomatic ascites. Please perform ultrasound-guided paracentesis for diagnostic and therapeutic purposes. EXAM: ULTRASOUND-GUIDED PARACENTESIS COMPARISON:  Ultrasound-guided paracentesis - 10/28/2017 CT abdomen and pelvis - 10/25/2017 MEDICATIONS: None. COMPLICATIONS: None immediate. TECHNIQUE: Informed written consent was obtained from the patient after a discussion of the risks, benefits and alternatives to treatment. A timeout was performed  prior to the initiation of the procedure. Initial ultrasound scanning demonstrates a small to moderate amount of ascites within the right lower abdominal quadrant. The right lower abdomen was prepped and draped in the usual sterile fashion. 1% lidocaine with epinephrine was used for local anesthesia. Under direct ultrasound guidance, a 19 gauge, 7-cm, Yueh catheter was introduced. An ultrasound image was saved for documentation purposed. The paracentesis was performed. The catheter was removed and a dressing was applied. The patient tolerated the procedure well without immediate post procedural complication. FINDINGS: A total of approximately 3 liters of serous fluid was removed. Samples were sent to the laboratory as requested by the clinical team. IMPRESSION: Successful ultrasound-guided paracentesis yielding 3 liters of peritoneal fluid. Electronically Signed   By: Sandi Mariscal M.D.   On: 11/09/2017 15:43    Lab Data:  CBC: Recent Labs  Lab 11/26/17 2133 11/28/17 0455 11/29/17 0725 12/01/17 0710  WBC 12.3* 10.5 7.8 7.3  HGB 7.9* 8.2* 7.7* 7.9*  HCT 24.2* 25.2* 23.5* 24.6*  MCV 73.1* 75.7* 73.4* 75.2*  PLT 204 227 231 417   Basic Metabolic Panel: Recent Labs  Lab 11/26/17 2133 11/28/17 0455 11/29/17 0755 12/01/17 0700  NA 132* 133* 133* 133*  K 4.7 5.1 5.3* 5.2*  CL 89* 91* 92* 94*  CO2 28 23 27 28   GLUCOSE 130* 114* 90 122*  BUN 26* 37* 42* 27*  CREATININE 6.73* 8.26* 9.07* 6.69*  CALCIUM 8.3* 8.5* 8.5* 9.0  PHOS  --   --  4.9* 3.4   GFR: Estimated Creatinine Clearance: 11.9 mL/min (A) (by C-G formula based on SCr of 6.69 mg/dL (H)). Liver Function Tests: Recent Labs  Lab 11/26/17 2133 11/29/17 0755 12/01/17 0700  AST 27  --   --   ALT 9*  --   --   ALKPHOS 213*  --   --   BILITOT 0.7  --   --   PROT 6.5  --   --   ALBUMIN 2.6* 2.3* 2.4*   Recent Labs  Lab 11/26/17 2133  LIPASE 24   Recent Labs  Lab 11/27/17 0225  AMMONIA 23   Coagulation Profile: Recent  Labs  Lab 11/27/17 0225  INR 1.30   Cardiac Enzymes: No results for input(s): CKTOTAL, CKMB, CKMBINDEX, TROPONINI in the last 168 hours. BNP (last 3 results) No results for input(s): PROBNP in the last 8760 hours. HbA1C: No results for input(s): HGBA1C in the last 72 hours. CBG: Recent Labs  Lab 11/26/17 2344  GLUCAP 106*   Lipid Profile: No results for input(s): CHOL, HDL, LDLCALC, TRIG, CHOLHDL, LDLDIRECT in the last 72 hours. Thyroid Function Tests: No results for input(s): TSH, T4TOTAL, FREET4, T3FREE, THYROIDAB in the last 72 hours. Anemia Panel: No results for input(s): VITAMINB12, FOLATE, FERRITIN, TIBC, IRON, RETICCTPCT in the last 72 hours. Urine analysis:    Component Value Date/Time   COLORURINE YELLOW 07/16/2011 1619   APPEARANCEUR CLEAR 07/16/2011 1619   LABSPEC 1.018 07/16/2011 1619   PHURINE 7.5 07/16/2011 1619   GLUCOSEU 250 (A) 07/16/2011 1619   HGBUR MODERATE (A) 07/16/2011 1619   BILIRUBINUR SMALL (A) 07/16/2011 1619   KETONESUR NEGATIVE 07/16/2011 1619   PROTEINUR >300 (A) 07/16/2011 1619   UROBILINOGEN 1.0 07/16/2011 1619   NITRITE NEGATIVE 07/16/2011 1619   LEUKOCYTESUR SMALL (A) 07/16/2011 1619     Valisha Heslin M.D. [facility] 12/01/2017, 12:09 PM  Pager: [phone] Between 7am to 7pm - call Pager - [phone]  After 7pm go to [email or web address] - password [email or web address]  Call night coverage person covering after 7pm

## 2017-12-02 LAB — CULTURE, BLOOD (ROUTINE X 2)
Culture: NO GROWTH
Culture: NO GROWTH
Special Requests: ADEQUATE

## 2017-12-02 LAB — PATHOLOGIST SMEAR REVIEW

## 2017-12-03 LAB — CULTURE, BODY FLUID W GRAM STAIN -BOTTLE: Culture: NO GROWTH

## 2017-12-05 DIAGNOSIS — A499 Bacterial infection, unspecified: Secondary | ICD-10-CM | POA: Diagnosis not present

## 2017-12-05 DIAGNOSIS — N2581 Secondary hyperparathyroidism of renal origin: Secondary | ICD-10-CM | POA: Diagnosis not present

## 2017-12-05 DIAGNOSIS — N186 End stage renal disease: Secondary | ICD-10-CM | POA: Diagnosis not present

## 2017-12-05 DIAGNOSIS — D509 Iron deficiency anemia, unspecified: Secondary | ICD-10-CM | POA: Diagnosis not present

## 2017-12-05 DIAGNOSIS — D631 Anemia in chronic kidney disease: Secondary | ICD-10-CM | POA: Diagnosis not present

## 2017-12-05 DIAGNOSIS — K652 Spontaneous bacterial peritonitis: Secondary | ICD-10-CM | POA: Diagnosis not present

## 2017-12-06 ENCOUNTER — Encounter (HOSPITAL_COMMUNITY): Payer: Self-pay | Admitting: Emergency Medicine

## 2017-12-06 ENCOUNTER — Emergency Department (HOSPITAL_COMMUNITY): Payer: Medicare Other

## 2017-12-06 ENCOUNTER — Emergency Department (HOSPITAL_COMMUNITY)
Admission: EM | Admit: 2017-12-06 | Discharge: 2017-12-06 | Disposition: A | Discharge status: Home / Self Care | Payer: Medicare Other | Attending: Emergency Medicine | Admitting: Emergency Medicine

## 2017-12-06 DIAGNOSIS — R188 Other ascites: Secondary | ICD-10-CM | POA: Diagnosis not present

## 2017-12-06 DIAGNOSIS — I120 Hypertensive chronic kidney disease with stage 5 chronic kidney disease or end stage renal disease: Secondary | ICD-10-CM | POA: Insufficient documentation

## 2017-12-06 DIAGNOSIS — J449 Chronic obstructive pulmonary disease, unspecified: Secondary | ICD-10-CM | POA: Diagnosis not present

## 2017-12-06 DIAGNOSIS — F17210 Nicotine dependence, cigarettes, uncomplicated: Secondary | ICD-10-CM | POA: Diagnosis not present

## 2017-12-06 DIAGNOSIS — N186 End stage renal disease: Secondary | ICD-10-CM | POA: Diagnosis not present

## 2017-12-06 DIAGNOSIS — Z7901 Long term (current) use of anticoagulants: Secondary | ICD-10-CM | POA: Diagnosis not present

## 2017-12-06 DIAGNOSIS — R109 Unspecified abdominal pain: Secondary | ICD-10-CM | POA: Diagnosis not present

## 2017-12-06 DIAGNOSIS — I509 Heart failure, unspecified: Secondary | ICD-10-CM | POA: Diagnosis not present

## 2017-12-06 DIAGNOSIS — Z992 Dependence on renal dialysis: Secondary | ICD-10-CM | POA: Insufficient documentation

## 2017-12-06 DIAGNOSIS — Z79899 Other long term (current) drug therapy: Secondary | ICD-10-CM | POA: Insufficient documentation

## 2017-12-06 DIAGNOSIS — R1084 Generalized abdominal pain: Secondary | ICD-10-CM | POA: Diagnosis not present

## 2017-12-06 DIAGNOSIS — R079 Chest pain, unspecified: Secondary | ICD-10-CM | POA: Diagnosis not present

## 2017-12-06 HISTORY — PX: IR PARACENTESIS: IMG2679

## 2017-12-06 LAB — CBC WITH DIFFERENTIAL/PLATELET
Basophils Absolute: 0.1 10*3/uL (ref 0.0–0.1)
Basophils Relative: 1 %
Eosinophils Absolute: 0.1 10*3/uL (ref 0.0–0.7)
Eosinophils Relative: 1 %
HCT: 26 % — ABNORMAL LOW (ref 39.0–52.0)
Hemoglobin: 8.5 g/dL — ABNORMAL LOW (ref 13.0–17.0)
Lymphocytes Relative: 14 %
Lymphs Abs: 1.6 10*3/uL (ref 0.7–4.0)
MCH: 24.4 pg — ABNORMAL LOW (ref 26.0–34.0)
MCHC: 32.7 g/dL (ref 30.0–36.0)
MCV: 74.5 fL — ABNORMAL LOW (ref 78.0–100.0)
Monocytes Absolute: 1 10*3/uL (ref 0.1–1.0)
Monocytes Relative: 9 %
Neutro Abs: 8.7 10*3/uL — ABNORMAL HIGH (ref 1.7–7.7)
Neutrophils Relative %: 75 %
Platelets: 240 10*3/uL (ref 150–400)
RBC: 3.49 MIL/uL — ABNORMAL LOW (ref 4.22–5.81)
RDW: 22.3 % — ABNORMAL HIGH (ref 11.5–15.5)
WBC: 11.5 10*3/uL — ABNORMAL HIGH (ref 4.0–10.5)

## 2017-12-06 LAB — GRAM STAIN

## 2017-12-06 LAB — BODY FLUID CELL COUNT WITH DIFFERENTIAL
Lymphs, Fluid: 20 %
Monocyte-Macrophage-Serous Fluid: 22 % — ABNORMAL LOW (ref 50–90)
Neutrophil Count, Fluid: 58 % — ABNORMAL HIGH (ref 0–25)
Total Nucleated Cell Count, Fluid: 727 cu mm (ref 0–1000)

## 2017-12-06 LAB — LIPASE, BLOOD: Lipase: 28 U/L (ref 11–51)

## 2017-12-06 LAB — LACTATE DEHYDROGENASE, PLEURAL OR PERITONEAL FLUID: LD, Fluid: 74 U/L — ABNORMAL HIGH (ref 3–23)

## 2017-12-06 LAB — PROTIME-INR
INR: 1.2
Prothrombin Time: 15.1 seconds (ref 11.4–15.2)

## 2017-12-06 LAB — COMPREHENSIVE METABOLIC PANEL
ALT: 13 U/L — ABNORMAL LOW (ref 17–63)
AST: 28 U/L (ref 15–41)
Albumin: 2.3 g/dL — ABNORMAL LOW (ref 3.5–5.0)
Alkaline Phosphatase: 190 U/L — ABNORMAL HIGH (ref 38–126)
Anion gap: 11 (ref 5–15)
BUN: 24 mg/dL — ABNORMAL HIGH (ref 6–20)
CO2: 29 mmol/L (ref 22–32)
Calcium: 8.4 mg/dL — ABNORMAL LOW (ref 8.9–10.3)
Chloride: 96 mmol/L — ABNORMAL LOW (ref 101–111)
Creatinine, Ser: 5.85 mg/dL — ABNORMAL HIGH (ref 0.61–1.24)
GFR calc Af Amer: 11 mL/min — ABNORMAL LOW (ref >60)
GFR calc non Af Amer: 10 mL/min — ABNORMAL LOW (ref >60)
Glucose, Bld: 125 mg/dL — ABNORMAL HIGH (ref 65–99)
Potassium: 5.1 mmol/L (ref 3.5–5.1)
Sodium: 136 mmol/L (ref 135–145)
Total Bilirubin: 0.7 mg/dL (ref 0.3–1.2)
Total Protein: 6.2 g/dL — ABNORMAL LOW (ref 6.5–8.1)

## 2017-12-06 LAB — CULTURE, BODY FLUID W GRAM STAIN -BOTTLE: Culture: NO GROWTH

## 2017-12-06 LAB — ALBUMIN, PLEURAL OR PERITONEAL FLUID: Albumin, Fluid: 1.6 g/dL

## 2017-12-06 LAB — I-STAT CHEM 8, ED
BUN: 22 mg/dL — ABNORMAL HIGH (ref 6–20)
Calcium, Ion: 1.04 mmol/L — ABNORMAL LOW (ref 1.15–1.40)
Chloride: 96 mmol/L — ABNORMAL LOW (ref 101–111)
Creatinine, Ser: 5.4 mg/dL — ABNORMAL HIGH (ref 0.61–1.24)
Glucose, Bld: 121 mg/dL — ABNORMAL HIGH (ref 65–99)
HCT: 24 % — ABNORMAL LOW (ref 39.0–52.0)
Hemoglobin: 8.2 g/dL — ABNORMAL LOW (ref 13.0–17.0)
Potassium: 4.9 mmol/L (ref 3.5–5.1)
Sodium: 135 mmol/L (ref 135–145)
TCO2: 31 mmol/L (ref 22–32)

## 2017-12-06 LAB — TROPONIN I: Troponin I: 0.04 ng/mL (ref <0.03)

## 2017-12-06 LAB — GLUCOSE, PLEURAL OR PERITONEAL FLUID: Glucose, Fluid: 125 mg/dL

## 2017-12-06 MED ORDER — LIDOCAINE HCL (PF) 2 % IJ SOLN
INTRAMUSCULAR | Status: AC | PRN
  Administered 2017-12-06: 10 mL

## 2017-12-06 MED ORDER — LIDOCAINE HCL (PF) 2 % IJ SOLN
INTRAMUSCULAR | Status: AC
  Filled 2017-12-06: qty 20

## 2017-12-06 NOTE — ED Notes (Signed)
Patient transported to IR 

## 2017-12-06 NOTE — ED Triage Notes (Signed)
Patient came from home via EMS reprts chest and abdominal pain. Last dialysis was yesterday-reports that 3 lits was removed from him. EMS administered 324mg  ASA and 1Nitro, patient reports that he feel "the same"

## 2017-12-06 NOTE — ED Provider Notes (Signed)
[facility] EMERGENCY DEPARTMENT Provider Note   CSN: [id number] Arrival date & time: 12/06/17  1151     History   Chief Complaint Chief Complaint  Patient presents with  . Chest Pain  . Abdominal Pain    HPI [name] is a 57 y.o. male.  The history is provided by the patient. No language interpreter was used.  Chest Pain   Associated symptoms include abdominal pain.  Abdominal Pain      [name] is a 57 y.o. male who presents to the Emergency Department complaining of abdominal pain.  He reports several days of lower abdominal pain and swelling.  He has poor appetite due to abdominal bloating.  Denies fever but his body stays hot.  He developed central chest pain, described as a heaviness that began this morning.  CP resolved after NTG by EMS.  Denies vomiting, diarrhea.  He has ESRD on HD - dialyzes M, W, F, last session was yesterday. He does have a history of ascites and is currently on antibiotics for SBP. He is compliant with his medications.  Past Medical History:  Diagnosis Date  . Anemia   . Anxiety   . Arthritis    left shoulder  . Atherosclerosis of aorta ([place])   . Cardiomegaly   . Chest pain    DATE UNKNOWN, C/O PERIODICALLY  . Cocaine abuse ([place])   . COPD exacerbation ([place]) 08/17/2016  . Coronary artery disease    stent 02/22/17  . ESRD (end stage renal disease) on dialysis ([place])    "[name]; MWF" (07/04/2017)  . GERD (gastroesophageal reflux disease)    DATE UNKNOWN  . Hemorrhoids   . Hepatitis B, chronic ([place])   . Hepatitis C   . History of kidney stones   . Hyperkalemia   . Hypertension   . Metabolic bone disease    Patient denies  . Mitral stenosis   . Myocardial infarction ([place])   . Pneumonia   . Pulmonary edema   . Solitary rectal ulcer syndrome 07/2017   at flex sig for rectal bleeding  . Tubular adenoma of colon     Patient Active Problem List   Diagnosis Date Noted  . ESRD (end stage  renal disease) ([place]) 11/21/2017  . GERD (gastroesophageal reflux disease) 11/16/2017  . Liver cirrhosis ([place]) 11/15/2017  . DNR (do not resuscitate)   . Palliative care by specialist   . Hyponatremia 11/04/2017  . SBP (spontaneous bacterial peritonitis) ([place]) 10/30/2017  . Liver disease, chronic 10/30/2017  . SOB (shortness of breath)   . Abdominal pain 10/28/2017  . Upper airway cough syndrome with flattening on f/v loop 10/13/17 c/w vcd 10/17/2017  . Elevated diaphragm 10/13/2017  . Ileus ([place]) 09/29/2017  . Malnutrition of moderate degree 09/29/2017  . Sinus congestion 09/03/2017  . Symptomatic anemia 09/02/2017  . Other cirrhosis of liver ([place]) 09/02/2017  . Left bundle branch block 09/02/2017  . Mitral stenosis 09/02/2017  . Hematochezia 07/15/2017  . Wide-complex tachycardia ([place])   . Endotracheally intubated   . ESRD on dialysis ([place]) 07/04/2017  . Acute respiratory failure with hypoxia ([place]) 06/18/2017  . CKD (chronic kidney disease) stage V requiring chronic dialysis ([place]) 06/18/2017  . History of Cocaine abuse ([place]) 06/18/2017  . Hypertension 06/18/2017  . Infection of AV graft for dialysis ([place]) 06/18/2017  . Anxiety 06/18/2017  . Anemia due to chronic kidney disease 06/18/2017  . Atrial flutter with rapid ventricular response ([place]) 06/18/2017  .  Personality disorder ([place]) 06/13/2017  . Cellulitis 06/12/2017  . Adjustment disorder with mixed anxiety and depressed mood 06/10/2017  . Suicidal ideation 06/10/2017  . Arm wound, left, sequela 06/10/2017  . Dyspnea on exertion 05/29/2017  . Tachycardia 05/29/2017  . Hyperkalemia 05/22/2017  . Acute metabolic encephalopathy   . Anemia 04/23/2017  . Ascites 04/23/2017  . COPD (chronic obstructive pulmonary disease) ([place]) 04/23/2017  . Acute on chronic respiratory failure with hypoxia ([place]) 03/25/2017  . Arrhythmia 03/25/2017  . COPD GOLD 0 with flattening on inps f/v  09/27/2016  . Essential hypertension 09/27/2016  .  Fluid overload 08/30/2016  . COPD exacerbation ([place]) 08/17/2016  . Hypertensive urgency 08/17/2016  . Respiratory failure ([place]) 08/17/2016  . Problem with dialysis access ([place]) 07/23/2016  . Chronic hepatitis B ([place]) 03/05/2014  . Chronic hepatitis C without hepatic coma ([place]) 03/05/2014  . Internal hemorrhoids with bleeding, swelling and itching 03/05/2014  . Thrombocytopenia ([place]) 03/05/2014  . Chest pain 02/27/2014  . Alcohol abuse 04/14/2009  . Cigarette smoker 04/14/2009  . GANGLION CYST 04/14/2009    Past Surgical History:  Procedure Laterality Date  . A/V FISTULAGRAM Left 05/26/2017   Procedure: A/V FISTULAGRAM;  Surgeon: Conrad Estacada, MD;  Location: [facility];  Service: Cardiovascular;  Laterality: Left;  . A/V FISTULAGRAM Right 11/18/2017   Procedure: A/V FISTULAGRAM - Right Arm;  Surgeon: Elam Dutch, MD;  Location: [facility] CV LAB;  Service: Cardiovascular;  Laterality: Right;  . APPLICATION OF WOUND VAC Left 06/14/2017   Procedure: APPLICATION OF WOUND VAC;  Surgeon: Katha Cabal, MD;  Location: [facility] ORS;  Service: Vascular;  Laterality: Left;  . AV FISTULA PLACEMENT  2012   BELIEVED WAS PLACED IN JUNE  . AV FISTULA PLACEMENT Right 08/09/2017   Procedure: Creation Right arm ARTERIOVENOUS BRACHIOCEPOHALIC FISTULA;  Surgeon: Elam Dutch, MD;  Location: [facility] OR;  Service: Vascular;  Laterality: Right;  . AV FISTULA PLACEMENT Right 11/22/2017   Procedure: INSERTION OF ARTERIOVENOUS (AV) GORE-TEX GRAFT RIGHT UPPER ARM;  Surgeon: Elam Dutch, MD;  Location: [facility];  Service: Vascular;  Laterality: Right;  . COLONOSCOPY    . CORONARY STENT INTERVENTION N/A 02/22/2017   Procedure: CORONARY STENT INTERVENTION;  Surgeon: Nigel Mormon, MD;  Location: [facility] LAB;  Service: Cardiovascular;  Laterality: N/A;  . FLEXIBLE SIGMOIDOSCOPY N/A 07/15/2017   Procedure: FLEXIBLE SIGMOIDOSCOPY;  Surgeon: Carol Ada, MD;  Location: [facility];   Service: Endoscopy;  Laterality: N/A;  . HEMORRHOID BANDING    . I&D EXTREMITY Left 06/01/2017   Procedure: IRRIGATION AND DEBRIDEMENT LEFT ARM HEMATOMA WITH LIGATION OF LEFT ARM AV FISTULA;  Surgeon: Elam Dutch, MD;  Location: [facility];  Service: Vascular;  Laterality: Left;  . I&D EXTREMITY Left 06/14/2017   Procedure: IRRIGATION AND DEBRIDEMENT EXTREMITY;  Surgeon: Katha Cabal, MD;  Location: [facility] ORS;  Service: Vascular;  Laterality: Left;  . INSERTION OF DIALYSIS CATHETER  05/30/2017  . INSERTION OF DIALYSIS CATHETER N/A 05/30/2017   Procedure: INSERTION OF DIALYSIS CATHETER;  Surgeon: Elam Dutch, MD;  Location: [facility];  Service: Vascular;  Laterality: N/A;  . IR PARACENTESIS  08/30/2017  . IR PARACENTESIS  09/29/2017  . IR PARACENTESIS  10/28/2017  . IR PARACENTESIS  11/09/2017  . IR PARACENTESIS  11/16/2017  . IR PARACENTESIS  11/28/2017  . IR PARACENTESIS  12/01/2017  . IR PARACENTESIS  12/06/2017  . LEFT HEART CATH AND CORONARY ANGIOGRAPHY N/A 02/22/2017  Procedure: LEFT HEART CATH AND CORONARY ANGIOGRAPHY;  Surgeon: Nigel Mormon, MD;  Location: [facility] CV LAB;  Service: Cardiovascular;  Laterality: N/A;  . LIGATION OF ARTERIOVENOUS  FISTULA Left 02/15/5642   Procedure: Plication of Left Arm Arteriovenous Fistula;  Surgeon: Elam Dutch, MD;  Location: [facility];  Service: Vascular;  Laterality: Left;  . POLYPECTOMY    . REVISON OF ARTERIOVENOUS FISTULA Left 10/08/5186   Procedure: PLICATION OF DISTAL ANEURYSMAL SEGEMENT OF LEFT UPPER ARM ARTERIOVENOUS FISTULA;  Surgeon: Elam Dutch, MD;  Location: [facility];  Service: Vascular;  Laterality: Left;  . REVISON OF ARTERIOVENOUS FISTULA Left 10/25/6061   Procedure: Plication of Left Upper Arm Fistula ;  Surgeon: Waynetta Sandy, MD;  Location: [facility];  Service: Vascular;  Laterality: Left;  . SKIN GRAFT SPLIT THICKNESS LEG / FOOT Left    SKIN GRAFT SPLIT THICKNESS LEFT ARM DONOR SITE: LEFT ANTERIOR THIGH  .  SKIN SPLIT GRAFT Left 07/04/2017   Procedure: SKIN GRAFT SPLIT THICKNESS LEFT ARM DONOR SITE: LEFT ANTERIOR THIGH;  Surgeon: Elam Dutch, MD;  Location: [facility];  Service: Vascular;  Laterality: Left;  . THROMBECTOMY W/ EMBOLECTOMY Left 06/05/2017   Procedure: EXPLORATION OF LEFT ARM FOR BLEEDING; OVERSEWED PROXIMAL FISTULA;  Surgeon: Angelia Mould, MD;  Location: [facility];  Service: Vascular;  Laterality: Left;  . WOUND EXPLORATION Left 06/03/2017   Procedure: WOUND EXPLORATION WITH WOUND VAC APPLICATION TO LEFT ARM;  Surgeon: Angelia Mould, MD;  Location: [facility];  Service: Vascular;  Laterality: Left;        Home Medications    Prior to Admission medications   Medication Sig Start Date End Date Taking? Authorizing Provider  albuterol (VENTOLIN HFA) 108 (90 Base) MCG/ACT inhaler Inhale 2 puffs into the lungs every 6 (six) hours as needed for wheezing or shortness of breath.   Yes [provider]  ALPRAZolam (XANAX) 0.25 MG tablet Take 1 tablet (0.25 mg total) by mouth 2 (two) times daily as needed for anxiety. 12/01/17  Yes Rai, Ripudeep K, MD  Ceftazidime (FORTAZ) 2 g SOLR injection Inject 2 g into the vein every other day. For 10days 12/01/17  Yes Rai, Ripudeep K, MD  ciprofloxacin (CIPRO) 500 MG tablet Take 1 tablet (500 mg total) by mouth at bedtime. Once completed IV antibiotics with dialysis, then start ciprofloxacin for prophylaxis 566m qhs 12/06/17  Yes Rai, Ripudeep K, MD  clopidogrel (PLAVIX) 75 MG tablet Take 1 tablet (75 mg total) by mouth daily. 07/15/17  Yes Rhyne, SHulen Shouts PA-C  loratadine (CLARITIN) 10 MG tablet Take 1 tablet (10 mg total) by mouth daily. 10/31/17  Yes TEugenie Filler MD  Oxycodone HCl 10 MG TABS Take 1 tablet (10 mg total) by mouth every 6 (six) hours as needed. Patient taking differently: Take 10 mg by mouth every 6 (six) hours as needed (for pain).  11/22/17  Yes Rhyne, SHulen Shouts PA-C  predniSONE (DELTASONE) 10 MG tablet Take  10 mg by mouth 2 (two) times daily.   Yes [provider]  budesonide-formoterol (SYMBICORT) 80-4.5 MCG/ACT inhaler Inhale 2 puffs into the lungs 2 (two) times daily. Patient not taking: Reported on 12/06/2017 10/13/17   WTanda Rockers MD  diltiazem (CARDIZEM CD) 120 MG 24 hr capsule Take 1 capsule (120 mg total) by mouth daily. 06/23/17 06/23/18  SDeatra James MD  famotidine (PEPCID) 20 MG tablet One at bedtime Patient taking differently: Take 20 mg by mouth at bedtime.  09/08/17   Tanda Rockers, MD  fluticasone (FLONASE) 50 MCG/ACT nasal spray Place 2 sprays into both nostrils daily. 10/31/17   Eugenie Filler, MD  hydrALAZINE (APRESOLINE) 100 MG tablet Take 1 tablet by mouth twice a day take after HD on dialysis days 08/31/17   [provider]  isosorbide mononitrate (IMDUR) 30 MG 24 hr tablet Take 1 tablet (30 mg total) by mouth daily. Patient taking differently: Take 30 mg by mouth daily. ER 06/17/17   [name], MD  lactulose [facility]) 10 GM/15ML solution Take twice a day on NON-dialysis days and once a day on dialysis days Patient not taking: Reported on 12/06/2017 12/01/17   Rai, Vernelle Emerald, MD  Nutritional Supplements (FEEDING SUPPLEMENT, NEPRO CARB STEADY,) LIQD Take 237 mLs by mouth 2 (two) times daily between meals. 09/30/17   Mariel Aloe, MD  pantoprazole (PROTONIX) 40 MG tablet Take 1 tablet (40 mg total) by mouth daily. Take 30-60 min before first meal of the day 09/08/17   Tanda Rockers, MD  sevelamer carbonate (RENVELA) 800 MG tablet Take 1,600-3,200 mg by mouth See admin instructions. Take 4 tablets (856m each) three times daily and 2 tablets twice daily with a snack. 10/31/17   [provider]  tiotropium (SPIRIVA) 18 MCG inhalation capsule Place 18 mcg into inhaler and inhale daily.    [provider]  Vancomycin (VANCOCIN) 750-5 MG/150ML-% SOLN Inject 150 mLs (750 mg total) into the vein every Monday, Wednesday, and Friday with  hemodialysis. For 10days 12/02/17   Rai, RVernelle Emerald MD    Family History Family History  Problem Relation Age of Onset  . Heart disease Mother   . Lung cancer Mother   . Heart disease Father   . Malignant hyperthermia Father   . COPD Father   . Throat cancer Sister   . Esophageal cancer Sister   . Hypertension Other   . COPD Other   . Colon cancer Neg Hx   . Colon polyps Neg Hx   . Rectal cancer Neg Hx   . Stomach cancer Neg Hx     Social History Social History   Tobacco Use  . Smoking status: Current Every Day Smoker    Packs/day: 0.50    Years: 43.00    Pack years: 21.50    Types: Cigarettes    Start date: 08/13/1973  . Smokeless tobacco: Never Used  Substance Use Topics  . Alcohol use: Not Currently    Frequency: Never    Comment: quit drinking in 2017  . Drug use: Not Currently    Comment: quit in 2017"     Allergies   Aspirin; Clonidine derivatives; Tramadol; and Tylenol [acetaminophen]   Review of Systems Review of Systems  Cardiovascular: Positive for chest pain.  Gastrointestinal: Positive for abdominal pain.  All other systems reviewed and are negative.    Physical Exam Updated Vital Signs BP (!) 101/55 Comment: post paracentesis  Pulse 96   Temp 97.7 F (36.5 C) (Oral)   Resp 18   SpO2 98%   Physical Exam  Constitutional: He is oriented to person, place, and time. He appears well-developed and well-nourished.  HENT:  Head: Normocephalic and atraumatic.  Cardiovascular: Normal rate and regular rhythm.  SEM  Pulmonary/Chest: Effort normal and breath sounds normal. No respiratory distress.  Vascular catheter in right anterior chest wall.   Abdominal: Soft. There is no rebound and no guarding.  Mildly distended abdomen, mild generalized abdominal tenderness.   Musculoskeletal:  He exhibits no edema or tenderness.  Neurological: He is alert and oriented to person, place, and time.  Skin: Skin is warm and dry.  Psychiatric: He has a normal  mood and affect. His behavior is normal.  Nursing note and vitals reviewed.    ED Treatments / Results  Labs (all labs ordered are listed, but only abnormal results are displayed) Labs Reviewed  COMPREHENSIVE METABOLIC PANEL - Abnormal; Notable for the following components:      Result Value   [place] 96 (*)    Glucose, Bld 125 (*)    BUN 24 (*)    Creatinine, Ser 5.85 (*)    Calcium 8.4 (*)    Total Protein 6.2 (*)    Albumin 2.3 (*)    ALT 13 (*)    Alkaline Phosphatase 190 (*)    GFR calc non Af Amer 10 (*)    GFR calc Af Amer 11 (*)    All other components within normal limits  CBC WITH DIFFERENTIAL/PLATELET - Abnormal; Notable for the following components:   WBC 11.5 (*)    RBC 3.49 (*)    Hemoglobin 8.5 (*)    HCT 26.0 (*)    MCV 74.5 (*)    MCH 24.4 (*)    RDW 22.3 (*)    Neutro Abs 8.7 (*)    All other components within normal limits  TROPONIN I - Abnormal; Notable for the following components:   Troponin I 0.04 (*)    All other components within normal limits  I-STAT CHEM 8, ED - Abnormal; Notable for the following components:   [place] 96 (*)    BUN 22 (*)    Creatinine, Ser 5.40 (*)    Glucose, Bld 121 (*)    Calcium, Ion 1.04 (*)    Hemoglobin 8.2 (*)    HCT 24.0 (*)    All other components within normal limits  GRAM STAIN  CULTURE, BODY FLUID-BOTTLE  PROTIME-INR  LIPASE, BLOOD  LACTATE DEHYDROGENASE, PLEURAL OR PERITONEAL FLUID  BODY FLUID CELL COUNT WITH DIFFERENTIAL  ALBUMIN, PLEURAL OR PERITONEAL FLUID  GLUCOSE, PLEURAL OR PERITONEAL FLUID    EKG EKG Interpretation  Date/Time:  Tuesday Dec 06 2017 11:52:34 EDT Ventricular Rate:  105 PR Interval:    QRS Duration: 157 QT Interval:  424 QTC Calculation: 561 R Axis:   98 Text Interpretation:  Right and left arm electrode reversal, interpretation assumes no reversal Sinus tachycardia with irregular rate Left ventricular hypertrophy Prolonged QT interval Confirmed by Quintella Reichert  [phone]) on 12/06/2017 12:19:34 PM   Radiology Dg Chest 2 View  Result Date: 12/06/2017 CLINICAL DATA:  Two weeks of abdominal pain and chest tightness. In-states dialysis dependent renal failure. History of coronary artery disease and COPD. EXAM: CHEST - 2 VIEW COMPARISON:  PA and lateral chest x-ray of Nov 15, 2017 FINDINGS: The lungs are adequately inflated. The interstitial markings are mildly prominent but this is not entirely new. The cardiac silhouette is enlarged and more conspicuous today. The pulmonary vascularity is more engorged. There is calcification in the wall of the thoracic aorta. A dual-lumen dialysis catheter terminates at the cavoatrial junction. The observed bony thorax exhibits no acute abnormality. IMPRESSION: Mild CHF which is likely both acute and chronic. No alveolar pneumonia. Thoracic aortic atherosclerosis. Electronically Signed   By: David  Martinique M.D.   On: 12/06/2017 12:59   Ir Paracentesis  Result Date: 12/06/2017 INDICATION: History of hepatitis C. Chronic renal insufficiency on dialysis. Abdominal distention secondary  to recurrent ascites. Request for diagnostic and therapeutic paracentesis. EXAM: ULTRASOUND GUIDED LEFT LOWER QUADRANT PARACENTESIS MEDICATIONS: None. COMPLICATIONS: None immediate. PROCEDURE: Informed written consent was obtained from the patient after a discussion of the risks, benefits and alternatives to treatment. A timeout was performed prior to the initiation of the procedure. Initial ultrasound scanning demonstrates a moderate amount of ascites within the left lower abdominal quadrant. The left lower abdomen was prepped and draped in the usual sterile fashion. 1% lidocaine with epinephrine was used for local anesthesia. Following this, a 19 gauge, 7-cm, Yueh catheter was introduced. An ultrasound image was saved for documentation purposes. The paracentesis was performed. The catheter was removed and a dressing was applied. The patient tolerated the  procedure well without immediate post procedural complication. FINDINGS: A total of approximately 1.7 L of hazy, amber colored fluid was removed. Samples were sent to the laboratory as requested by the clinical team. IMPRESSION: Successful ultrasound-guided paracentesis yielding 1.7 liters of peritoneal fluid. Read by: Ascencion Dike PA-C Electronically Signed   By: Corrie Mckusick D.O.   On: 12/06/2017 16:03    Procedures Procedures (including critical care time)  Medications Ordered in ED Medications  lidocaine (XYLOCAINE) 2 % injection (has no administration in time range)  lidocaine (XYLOCAINE) 2 % injection (10 mLs Infiltration Given 12/06/17 1544)     Initial Impression / Assessment and Plan / ED Course  I have reviewed the triage vital signs and the nursing notes.  Pertinent labs & imaging results that were available during my care of the patient were reviewed by me and considered in my medical decision making (see chart for details).     Patient with history of ESR D on dialysis as well as cirrhosis with SBP here for evaluation of abdominal pain, episode of chest pain. He is in no acute distress in the emergency department. Abdomen is mildly tender without peritoneal findings. Repeat paracentesis done given patient's history of SBP. Presentation is not consistent with ACS, PE, acute CHF, volume overload. Troponin is mildly elevated and this is near his baseline. Following paracentesis he states that he feels well and symptoms have resolved. Body fluid with decreasing WBC count.  Plan to discharge home with outpatient follow-up and return precautions.  Final Clinical Impressions(s) / ED Diagnoses   Final diagnoses:  Abdominal pain  ESRD (end stage renal disease) [facility])    ED Discharge Orders    None       Quintella Reichert, MD 12/07/17 [phone]

## 2017-12-06 NOTE — Discharge Instructions (Signed)
Continue your current medications. Continue dialysis as usual.  Follow up with Gastroenterology.  Get rechecked if you have fevers, difficulty breathing or new concerning symptoms.

## 2017-12-06 NOTE — Procedures (Signed)
PROCEDURE SUMMARY:  Successful US guided paracentesis from LLQ.  Yielded 1.7 L of hazy, amber colored fluid.  No immediate complications.  Pt tolerated well.   Specimen was sent for labs.  Ascencion Dike PA-C 12/06/2017 4:01 PM

## 2017-12-07 ENCOUNTER — Emergency Department (HOSPITAL_COMMUNITY): Payer: Medicare Other

## 2017-12-07 ENCOUNTER — Other Ambulatory Visit: Payer: Self-pay

## 2017-12-07 ENCOUNTER — Emergency Department (HOSPITAL_COMMUNITY)
Admission: EM | Admit: 2017-12-07 | Discharge: 2017-12-07 | Disposition: A | Discharge status: Home / Self Care | Payer: Medicare Other | Attending: Emergency Medicine | Admitting: Emergency Medicine

## 2017-12-07 ENCOUNTER — Encounter (HOSPITAL_COMMUNITY): Payer: Self-pay

## 2017-12-07 DIAGNOSIS — R000 Tachycardia, unspecified: Secondary | ICD-10-CM | POA: Diagnosis not present

## 2017-12-07 DIAGNOSIS — R079 Chest pain, unspecified: Secondary | ICD-10-CM | POA: Diagnosis not present

## 2017-12-07 DIAGNOSIS — R1033 Periumbilical pain: Secondary | ICD-10-CM | POA: Diagnosis not present

## 2017-12-07 DIAGNOSIS — I2510 Atherosclerotic heart disease of native coronary artery without angina pectoris: Secondary | ICD-10-CM | POA: Insufficient documentation

## 2017-12-07 DIAGNOSIS — R609 Edema, unspecified: Secondary | ICD-10-CM | POA: Diagnosis not present

## 2017-12-07 DIAGNOSIS — F17210 Nicotine dependence, cigarettes, uncomplicated: Secondary | ICD-10-CM | POA: Diagnosis not present

## 2017-12-07 DIAGNOSIS — Z992 Dependence on renal dialysis: Secondary | ICD-10-CM | POA: Diagnosis not present

## 2017-12-07 DIAGNOSIS — R011 Cardiac murmur, unspecified: Secondary | ICD-10-CM | POA: Diagnosis not present

## 2017-12-07 DIAGNOSIS — Z79899 Other long term (current) drug therapy: Secondary | ICD-10-CM | POA: Insufficient documentation

## 2017-12-07 DIAGNOSIS — I120 Hypertensive chronic kidney disease with stage 5 chronic kidney disease or end stage renal disease: Secondary | ICD-10-CM | POA: Diagnosis not present

## 2017-12-07 DIAGNOSIS — J449 Chronic obstructive pulmonary disease, unspecified: Secondary | ICD-10-CM | POA: Insufficient documentation

## 2017-12-07 DIAGNOSIS — A499 Bacterial infection, unspecified: Secondary | ICD-10-CM | POA: Diagnosis not present

## 2017-12-07 DIAGNOSIS — R0789 Other chest pain: Secondary | ICD-10-CM | POA: Diagnosis not present

## 2017-12-07 DIAGNOSIS — K7460 Unspecified cirrhosis of liver: Secondary | ICD-10-CM | POA: Diagnosis not present

## 2017-12-07 DIAGNOSIS — N186 End stage renal disease: Secondary | ICD-10-CM | POA: Insufficient documentation

## 2017-12-07 DIAGNOSIS — N2581 Secondary hyperparathyroidism of renal origin: Secondary | ICD-10-CM | POA: Diagnosis not present

## 2017-12-07 DIAGNOSIS — Z7902 Long term (current) use of antithrombotics/antiplatelets: Secondary | ICD-10-CM | POA: Diagnosis not present

## 2017-12-07 DIAGNOSIS — K652 Spontaneous bacterial peritonitis: Secondary | ICD-10-CM | POA: Diagnosis not present

## 2017-12-07 DIAGNOSIS — R1084 Generalized abdominal pain: Secondary | ICD-10-CM

## 2017-12-07 DIAGNOSIS — D631 Anemia in chronic kidney disease: Secondary | ICD-10-CM | POA: Diagnosis not present

## 2017-12-07 DIAGNOSIS — D509 Iron deficiency anemia, unspecified: Secondary | ICD-10-CM | POA: Diagnosis not present

## 2017-12-07 LAB — COMPREHENSIVE METABOLIC PANEL
ALT: 15 U/L — ABNORMAL LOW (ref 17–63)
AST: 31 U/L (ref 15–41)
Albumin: 2.4 g/dL — ABNORMAL LOW (ref 3.5–5.0)
Alkaline Phosphatase: 218 U/L — ABNORMAL HIGH (ref 38–126)
Anion gap: 10 (ref 5–15)
BUN: 17 mg/dL (ref 6–20)
CO2: 30 mmol/L (ref 22–32)
Calcium: 8.4 mg/dL — ABNORMAL LOW (ref 8.9–10.3)
Chloride: 94 mmol/L — ABNORMAL LOW (ref 101–111)
Creatinine, Ser: 4.55 mg/dL — ABNORMAL HIGH (ref 0.61–1.24)
GFR calc Af Amer: 15 mL/min — ABNORMAL LOW (ref >60)
GFR calc non Af Amer: 13 mL/min — ABNORMAL LOW (ref >60)
Glucose, Bld: 111 mg/dL — ABNORMAL HIGH (ref 65–99)
Potassium: 4.6 mmol/L (ref 3.5–5.1)
Sodium: 134 mmol/L — ABNORMAL LOW (ref 135–145)
Total Bilirubin: 0.6 mg/dL (ref 0.3–1.2)
Total Protein: 6.3 g/dL — ABNORMAL LOW (ref 6.5–8.1)

## 2017-12-07 LAB — CBC WITH DIFFERENTIAL/PLATELET
Basophils Absolute: 0.1 10*3/uL (ref 0.0–0.1)
Basophils Relative: 1 %
Eosinophils Absolute: 0.1 10*3/uL (ref 0.0–0.7)
Eosinophils Relative: 1 %
HCT: 25.5 % — ABNORMAL LOW (ref 39.0–52.0)
Hemoglobin: 8.3 g/dL — ABNORMAL LOW (ref 13.0–17.0)
Lymphocytes Relative: 17 %
Lymphs Abs: 1.9 10*3/uL (ref 0.7–4.0)
MCH: 24.6 pg — ABNORMAL LOW (ref 26.0–34.0)
MCHC: 32.5 g/dL (ref 30.0–36.0)
MCV: 75.7 fL — ABNORMAL LOW (ref 78.0–100.0)
Monocytes Absolute: 0.9 10*3/uL (ref 0.1–1.0)
Monocytes Relative: 8 %
Neutro Abs: 7.9 10*3/uL — ABNORMAL HIGH (ref 1.7–7.7)
Neutrophils Relative %: 73 %
Platelets: 208 10*3/uL (ref 150–400)
RBC: 3.37 MIL/uL — ABNORMAL LOW (ref 4.22–5.81)
RDW: 22.5 % — ABNORMAL HIGH (ref 11.5–15.5)
WBC: 10.9 10*3/uL — ABNORMAL HIGH (ref 4.0–10.5)

## 2017-12-07 LAB — LIPASE, BLOOD: Lipase: 32 U/L (ref 11–51)

## 2017-12-07 LAB — PROTIME-INR
INR: 1.21
Prothrombin Time: 15.2 seconds (ref 11.4–15.2)

## 2017-12-07 LAB — PATHOLOGIST SMEAR REVIEW

## 2017-12-07 LAB — TROPONIN I: Troponin I: 0.04 ng/mL (ref <0.03)

## 2017-12-07 LAB — I-STAT CG4 LACTIC ACID, ED
Lactic Acid, Venous: 1.33 mmol/L (ref 0.5–1.9)
Lactic Acid, Venous: 1.08 mmol/L (ref 0.5–1.9)

## 2017-12-07 MED ORDER — IOHEXOL 300 MG/ML  SOLN
100.0000 mL | Freq: Once | INTRAMUSCULAR | Status: AC | PRN
  Administered 2017-12-07: 100 mL via INTRAVENOUS

## 2017-12-07 MED ORDER — FENTANYL CITRATE (PF) 100 MCG/2ML IJ SOLN
50.0000 ug | Freq: Once | INTRAMUSCULAR | Status: AC
  Administered 2017-12-07: 50 ug via INTRAVENOUS
  Filled 2017-12-07: qty 2

## 2017-12-07 NOTE — Discharge Instructions (Signed)
Please read and follow all provided instructions.  Your diagnoses today include:  1. Generalized abdominal pain   2. ESRD (end stage renal disease) ([place])     Tests performed today include:  CT scan - no problems today  Blood counts and electrolytes   Vital signs. See below for your results today.   Medications prescribed:   None  Take any prescribed medications only as directed.  Home care instructions:  Follow any educational materials contained in this packet.  BE VERY CAREFUL not to take multiple medicines containing Tylenol (also called acetaminophen). Doing so can lead to an overdose which can damage your liver and cause liver failure and possibly death.   Follow-up instructions: Please follow-up with your primary care provider in the next 2 days for further evaluation of your symptoms.   Return instructions:   Please return to the Emergency Department if you experience worsening symptoms.   Please return if you have any other emergent concerns.  Additional Information:  Your vital signs today were: BP (!) 143/69    Pulse 99    Temp 98.5 F (36.9 C) (Oral)    Resp 17    SpO2 98%  If your blood pressure (BP) was elevated above 135/85 this visit, please have this repeated by your doctor within one month. --------------

## 2017-12-07 NOTE — ED Triage Notes (Signed)
[facility]- pt coming from home with complaint of abdominal pain/chest pain. Seen here yesterday for the same. Pt had fluid drained off his abdomen. Pt began having abd pain while at dialysis today and had felt "like an elephant was sitting on his chest." Pt requested pain meds with EMS but none were given. HR 127 with EMS. BP 106/63

## 2017-12-07 NOTE — ED Provider Notes (Addendum)
3:53 PM Handoff from [facility] at shift change.   Pt with h/o ESRD, recurrent SBP, frequent admissions -- presents with worsening CP and abd pain. Discharged on 5/23 with abx given until 5/27 for SBP. Seen yesterday with reassuring diagnostic paracentesis.   Plan today: CT imaging (cleared by nephrology) to eval any underlying cause other than SBP. Pt is notably tachycardic today which is different from yesterday. He had 50% of routine dialysis yesterday.   BP 128/64   Pulse (!) 116   Temp 98.5 F (36.9 C) (Oral)   Resp (!) 22   SpO2 97%    7:12 PM CT imaging reassuring. Labs are all at baseline. WBC stable or slightly improved from yesterday. Patient is requesting graham crackers. Abdomen is generally tender is not much changed from previous account. Feel comfortable with d/c home at this time.   The patient was urged to return to the Emergency Department immediately with worsening of current symptoms, worsening abdominal pain, persistent vomiting, blood noted in stools, fever, or any other concerns. The patient verbalized understanding.   Results for orders placed or performed during the hospital encounter of 12/07/17  Comprehensive metabolic panel  Result Value Ref Range   Sodium 134 (L) 135 - 145 mmol/L   Potassium 4.6 3.5 - 5.1 mmol/L   Chloride 94 (L) 101 - 111 mmol/L   CO2 30 22 - 32 mmol/L   Glucose, Bld 111 (H) 65 - 99 mg/dL   BUN 17 6 - 20 mg/dL   Creatinine, Ser 4.55 (H) 0.61 - 1.24 mg/dL   Calcium 8.4 (L) 8.9 - 10.3 mg/dL   Total Protein 6.3 (L) 6.5 - 8.1 g/dL   Albumin 2.4 (L) 3.5 - 5.0 g/dL   AST 31 15 - 41 U/L   ALT 15 (L) 17 - 63 U/L   Alkaline Phosphatase 218 (H) 38 - 126 U/L   Total Bilirubin 0.6 0.3 - 1.2 mg/dL   GFR calc non Af Amer 13 (L) >60 mL/min   GFR calc Af Amer 15 (L) >60 mL/min   Anion gap 10 5 - 15  Troponin I  Result Value Ref Range   Troponin I 0.04 (HH) <0.03 ng/mL  CBC with Differential  Result Value Ref Range   WBC 10.9 (H) 4.0 - 10.5  K/uL   RBC 3.37 (L) 4.22 - 5.81 MIL/uL   Hemoglobin 8.3 (L) 13.0 - 17.0 g/dL   HCT 25.5 (L) 39.0 - 52.0 %   MCV 75.7 (L) 78.0 - 100.0 fL   MCH 24.6 (L) 26.0 - 34.0 pg   MCHC 32.5 30.0 - 36.0 g/dL   RDW 22.5 (H) 11.5 - 15.5 %   Platelets 208 150 - 400 K/uL   Neutrophils Relative % 73 %   Lymphocytes Relative 17 %   Monocytes Relative 8 %   Eosinophils Relative 1 %   Basophils Relative 1 %   Neutro Abs 7.9 (H) 1.7 - 7.7 K/uL   Lymphs Abs 1.9 0.7 - 4.0 K/uL   Monocytes Absolute 0.9 0.1 - 1.0 K/uL   Eosinophils Absolute 0.1 0.0 - 0.7 K/uL   Basophils Absolute 0.1 0.0 - 0.1 K/uL   RBC Morphology TARGET CELLS   Lipase, blood  Result Value Ref Range   Lipase 32 11 - 51 U/L  Protime-INR  Result Value Ref Range   Prothrombin Time 15.2 11.4 - 15.2 seconds   INR 1.21   I-Stat CG4 Lactic Acid, ED  Result Value Ref Range  Lactic Acid, Venous 1.33 0.5 - 1.9 mmol/L  I-Stat CG4 Lactic Acid, ED  Result Value Ref Range   Lactic Acid, Venous 1.08 0.5 - 1.9 mmol/L   Dg Chest 2 View  Result Date: 12/07/2017 CLINICAL DATA:  Acute chest pain today. EXAM: CHEST - 2 VIEW COMPARISON:  12/06/2017 and prior radiograph FINDINGS: Moderate to marked cardiomegaly and pulmonary vascular congestion again noted. Mild LEFT basilar atelectasis/scarring is unchanged. A RIGHT IJ central venous catheter with tips overlying the RIGHT atrium again identified. There is no evidence of airspace disease, pleural effusion or pneumothorax. IMPRESSION: Moderate to marked cardiomegaly with pulmonary vascular congestion. Unchanged mild LEFT basilar atelectasis/scarring. Electronically Signed   By: Margarette Canada M.D.   On: 12/07/2017 16:25   Ct Abdomen Pelvis W Contrast  Result Date: 12/07/2017 CLINICAL DATA:  abdominal pain/chest pain. Seen here yesterday for the same. Pt had fluid drained off his abdomen. Pt began having abd pain while at dialysis today and had felt "like an elephant was sitting on his chest." EXAM: CT ABDOMEN  AND PELVIS WITH CONTRAST TECHNIQUE: Multidetector CT imaging of the abdomen and pelvis was performed using the standard protocol following bolus administration of intravenous contrast. CONTRAST:  159mL OMNIPAQUE IOHEXOL 300 MG/ML  SOLN COMPARISON:  11/27/2017 FINDINGS: Lower chest: Coronary calcifications. Cardiomegaly. Small pericardial effusion. No pleural effusion. Visualized lung bases clear. Hepatobiliary: Mildly nodular liver contour. No focal lesion. Gallbladder unremarkable. No biliary ductal dilatation. Portal vein patent. Pancreas: Unremarkable. No pancreatic ductal dilatation or surrounding inflammatory changes. Spleen: Normal in size without focal abnormality. Adrenals/Urinary Tract: Normal adrenals. Bilateral renal atrophy with small low-attenuation lesions up to 1 cm. Heavy renal arterial calcifications. No hydronephrosis. Urinary bladder decompressed. Stomach/Bowel: Stomach is nondistended. Multiple fluid distended small bowel loops. Appendix not discretely identified. The colon is nondilated, unremarkable. Vascular/Lymphatic: Heavy aortic and branch vessel atheromatous calcifications without aneurysm. No abdominal or pelvic adenopathy localized. Reproductive: Prostate is unremarkable. Other: Stable  moderate pelvic and abdominal ascites.  No free air. Musculoskeletal: Sclerotic changes of renal osteodystrophy in the pelvis and lumbosacral spine. Degenerative disc disease L5-S1. Negative for fracture or worrisome bone lesion. IMPRESSION: 1. No acute findings. 2. Cirrhosis with stable abdominal ascites since prior study. 3. Coronary and aortoiliac atherosclerosis. Electronically Signed   By: Lucrezia Europe M.D.   On: 12/07/2017 18:19      Carlisle Cater, PA-C 12/07/17 1915    Mesner, Corene Cornea, MD 12/08/17 [id number]

## 2017-12-07 NOTE — ED Provider Notes (Signed)
[facility] EMERGENCY DEPARTMENT Provider Note   CSN: [id number] Arrival date & time: 12/07/17  1328     History   Chief Complaint Chief Complaint  Patient presents with  . Chest Pain  . Abdominal Pain    HPI [name] is a 57 y.o. male.  HPI  Patient is a 56 from male with a history of ESRD, dialyzed Monday Wednesday Friday at [facility], last session today, which was terminated approximately 50% of the way through the session due to severe abdominal pain.  Patient transported to emergency department by EMS.  Patient reports that he has acute, worsening generalized abdominal pain in the periumbilical region.  Patient reports it is the same as the quality of it.  Abdominal pain that he is experienced over the last couple weeks with SBP, but it is worse.  Patient unable to describe the quality of the pain.  Patient reports last bowel movement was today, and normal for patient, without diarrhea, hard stools, melena, or hematochezia.  Patient denies any fever or chills, nausea or vomiting.  Patient denying any chest pain or shortness of breath at this time.  Patient is a poor historian, and unable to identify how he is taking his antibiotics.  Past Medical History:  Diagnosis Date  . Anemia   . Anxiety   . Arthritis    left shoulder  . Atherosclerosis of aorta ([place])   . Cardiomegaly   . Chest pain    DATE UNKNOWN, C/O PERIODICALLY  . Cocaine abuse ([place])   . COPD exacerbation ([place]) 08/17/2016  . Coronary artery disease    stent 02/22/17  . ESRD (end stage renal disease) on dialysis ([place])    "[name]; MWF" (07/04/2017)  . GERD (gastroesophageal reflux disease)    DATE UNKNOWN  . Hemorrhoids   . Hepatitis B, chronic ([place])   . Hepatitis C   . History of kidney stones   . Hyperkalemia   . Hypertension   . Metabolic bone disease    Patient denies  . Mitral stenosis   . Myocardial infarction ([place])   . Pneumonia   . Pulmonary  edema   . Solitary rectal ulcer syndrome 07/2017   at flex [facility] for rectal bleeding  . Tubular adenoma of colon     Patient Active Problem List   Diagnosis Date Noted  . ESRD (end stage renal disease) ([place]) 11/21/2017  . GERD (gastroesophageal reflux disease) 11/16/2017  . Liver cirrhosis ([place]) 11/15/2017  . DNR (do not resuscitate)   . Palliative care by specialist   . Hyponatremia 11/04/2017  . SBP (spontaneous bacterial peritonitis) ([place]) 10/30/2017  . Liver disease, chronic 10/30/2017  . SOB (shortness of breath)   . Abdominal pain 10/28/2017  . Upper airway cough syndrome with flattening on f/v loop 10/13/17 c/w vcd 10/17/2017  . Elevated diaphragm 10/13/2017  . Ileus ([place]) 09/29/2017  . Malnutrition of moderate degree 09/29/2017  . Sinus congestion 09/03/2017  . Symptomatic anemia 09/02/2017  . Other cirrhosis of liver ([place]) 09/02/2017  . Left bundle branch block 09/02/2017  . Mitral stenosis 09/02/2017  . Hematochezia 07/15/2017  . Wide-complex tachycardia ([place])   . Endotracheally intubated   . ESRD on dialysis ([place]) 07/04/2017  . Acute respiratory failure with hypoxia ([place]) 06/18/2017  . CKD (chronic kidney disease) stage V requiring chronic dialysis ([place]) 06/18/2017  . History of Cocaine abuse ([place]) 06/18/2017  . Hypertension 06/18/2017  . Infection of AV graft for dialysis ([place])  06/18/2017  . Anxiety 06/18/2017  . Anemia due to chronic kidney disease 06/18/2017  . Atrial flutter with rapid ventricular response ([place]) 06/18/2017  . Personality disorder ([place]) 06/13/2017  . Cellulitis 06/12/2017  . Adjustment disorder with mixed anxiety and depressed mood 06/10/2017  . Suicidal ideation 06/10/2017  . Arm wound, left, sequela 06/10/2017  . Dyspnea on exertion 05/29/2017  . Tachycardia 05/29/2017  . Hyperkalemia 05/22/2017  . Acute metabolic encephalopathy   . Anemia 04/23/2017  . Ascites 04/23/2017  . COPD (chronic obstructive pulmonary disease) ([place]) 04/23/2017    . Acute on chronic respiratory failure with hypoxia ([place]) 03/25/2017  . Arrhythmia 03/25/2017  . COPD GOLD 0 with flattening on inps f/v  09/27/2016  . Essential hypertension 09/27/2016  . Fluid overload 08/30/2016  . COPD exacerbation ([place]) 08/17/2016  . Hypertensive urgency 08/17/2016  . Respiratory failure ([place]) 08/17/2016  . Problem with dialysis access ([place]) 07/23/2016  . Chronic hepatitis B ([place]) 03/05/2014  . Chronic hepatitis C without hepatic coma ([place]) 03/05/2014  . Internal hemorrhoids with bleeding, swelling and itching 03/05/2014  . Thrombocytopenia ([place]) 03/05/2014  . Chest pain 02/27/2014  . Alcohol abuse 04/14/2009  . Cigarette smoker 04/14/2009  . GANGLION CYST 04/14/2009    Past Surgical History:  Procedure Laterality Date  . A/V FISTULAGRAM Left 05/26/2017   Procedure: A/V FISTULAGRAM;  Surgeon: Conrad [place], MD;  Location: [facility];  Service: Cardiovascular;  Laterality: Left;  . A/V FISTULAGRAM Right 11/18/2017   Procedure: A/V FISTULAGRAM - Right Arm;  Surgeon: Elam Dutch, MD;  Location: [facility];  Service: Cardiovascular;  Laterality: Right;  . APPLICATION OF WOUND VAC Left 06/14/2017   Procedure: APPLICATION OF WOUND VAC;  Surgeon: Katha Cabal, MD;  Location: [facility]S;  Service: Vascular;  Laterality: Left;  . AV FISTULA PLACEMENT  2012   BELIEVED WAS PLACED IN JUNE  . AV FISTULA PLACEMENT Right 08/09/2017   Procedure: Creation Right arm ARTERIOVENOUS BRACHIOCEPOHALIC FISTULA;  Surgeon: Elam Dutch, MD;  Location[facility];  Service: Vascular;  Laterality: Right;  . AV FISTULA PLACEMENT Right 11/22/2017   Procedure: INSERTION OF ARTERIOVENOUS (AV) GORE-TEX GRAFT RIGHT UPPER ARM;  Surgeon: Elam Dutch, MD;  Location: [facility];  Service: Vascular;  Laterality: Right;  . COLONOSCOPY    . CORONARY STENT INTERVENTION N/A 02/22/2017   Procedure: CORONARY STENT INTERVENTION;  Surgeon: Nigel Mormon, MD;  Location: [facility];  Service: Cardiovascular;  Laterality: N/A;  . FLEXIBLE SIGMOIDOSCOPY N/A 07/15/2017   Procedure: FLEXIBLE SIGMOIDOSCOPY;  Surgeon: Carol Ada, MD;  Location: [employer];  Service: Endoscopy;  Laterality: N/A;  . HEMORRHOID BANDING    . I&D EXTREMITY Left 06/01/2017   Procedure: IRRIGATION AND DEBRIDEMENT LEFT ARM HEMATOMA WITH LIGATION OF LEFT ARM AV FISTULA;  Surgeon: Elam Dutch, MD;  Location: [facility];  Service: Vascular;  Laterality: Left;  . I&D EXTREMITY Left 06/14/2017   Procedure: IRRIGATION AND DEBRIDEMENT EXTREMITY;  Surgeon: Katha Cabal, MD;  Location: [facility] ORS;  Service: Vascular;  Laterality: Left;  . INSERTION OF DIALYSIS CATHETER  05/30/2017  . INSERTION OF DIALYSIS CATHETER N/A 05/30/2017   Procedure: INSERTION OF DIALYSIS CATHETER;  Surgeon: Elam Dutch, MD;  Location: [facility];  Service: Vascular;  Laterality: N/A;  . IR PARACENTESIS  08/30/2017  . IR PARACENTESIS  09/29/2017  . IR PARACENTESIS  10/28/2017  . IR PARACENTESIS  11/09/2017  . IR PARACENTESIS  11/16/2017  .  IR PARACENTESIS  11/28/2017  . IR PARACENTESIS  12/01/2017  . IR PARACENTESIS  12/06/2017  . LEFT HEART CATH AND CORONARY ANGIOGRAPHY N/A 02/22/2017   Procedure: LEFT HEART CATH AND CORONARY ANGIOGRAPHY;  Surgeon: Nigel Mormon, MD;  Location: [facility];  Service: Cardiovascular;  Laterality: N/A;  . LIGATION OF ARTERIOVENOUS  FISTULA Left 08/20/9369   Procedure: Plication of Left Arm Arteriovenous Fistula;  Surgeon: Elam Dutch, MD;  Location: [facility];  Service: Vascular;  Laterality: Left;  . POLYPECTOMY    . REVISON OF ARTERIOVENOUS FISTULA Left 6/96/7893   Procedure: PLICATION OF DISTAL ANEURYSMAL SEGEMENT OF LEFT UPPER ARM ARTERIOVENOUS FISTULA;  Surgeon: Elam Dutch, MD;  Location: [facility];  Service: Vascular;  Laterality: Left;  . REVISON OF ARTERIOVENOUS FISTULA Left 02/18/1750   Procedure: Plication of Left Upper Arm Fistula ;  Surgeon: Waynetta Sandy, MD;  Location: [facility];  Service: Vascular;  Laterality: Left;  . SKIN GRAFT SPLIT THICKNESS LEG / FOOT Left    SKIN GRAFT SPLIT THICKNESS LEFT ARM DONOR SITE: LEFT ANTERIOR THIGH  . SKIN SPLIT GRAFT Left 07/04/2017   Procedure: SKIN GRAFT SPLIT THICKNESS LEFT ARM DONOR SITE: LEFT ANTERIOR THIGH;  Surgeon: Elam Dutch, MD;  Location: [facility];  Service: Vascular;  Laterality: Left;  . THROMBECTOMY W/ EMBOLECTOMY Left 06/05/2017   Procedure: EXPLORATION OF LEFT ARM FOR BLEEDING; OVERSEWED PROXIMAL FISTULA;  Surgeon: Angelia Mould, MD;  Location: [facility];  Service: Vascular;  Laterality: Left;  . WOUND EXPLORATION Left 06/03/2017   Procedure: WOUND EXPLORATION WITH WOUND VAC APPLICATION TO LEFT ARM;  Surgeon: Angelia Mould, MD;  Location: [facility];  Service: Vascular;  Laterality: Left;        Home Medications    Prior to Admission medications   Medication Sig Start Date End Date Taking? Authorizing Provider  albuterol (VENTOLIN HFA) 108 (90 Base) MCG/ACT inhaler Inhale 2 puffs into the lungs every 6 (six) hours as needed for wheezing or shortness of breath.    [provider]  ALPRAZolam Duanne Moron) 0.25 MG tablet Take 1 tablet (0.25 mg total) by mouth 2 (two) times daily as needed for anxiety. 12/01/17   Rai, Vernelle Emerald, MD  budesonide-formoterol (SYMBICORT) 80-4.5 MCG/ACT inhaler Inhale 2 puffs into the lungs 2 (two) times daily. Patient not taking: Reported on 12/06/2017 10/13/17   Tanda Rockers, MD  Ceftazidime (FORTAZ) 2 g SOLR injection Inject 2 g into the vein every other day. For 10days 12/01/17   Rai, Vernelle Emerald, MD  ciprofloxacin (CIPRO) 500 MG tablet Take 1 tablet (500 mg total) by mouth at bedtime. Once completed IV antibiotics with dialysis, then start ciprofloxacin for prophylaxis 500mg  qhs 12/06/17   Rai, Ripudeep K, MD  clopidogrel (PLAVIX) 75 MG tablet Take 1 tablet (75 mg total) by mouth daily. 07/15/17   Rhyne, Hulen Shouts, PA-C  diltiazem (CARDIZEM CD)  120 MG 24 hr capsule Take 1 capsule (120 mg total) by mouth daily. 06/23/17 06/23/18  Deatra James, MD  famotidine (PEPCID) 20 MG tablet One at bedtime Patient taking differently: Take 20 mg by mouth at bedtime.  09/08/17   Tanda Rockers, MD  fluticasone (FLONASE) 50 MCG/ACT nasal spray Place 2 sprays into both nostrils daily. 10/31/17   Eugenie Filler, MD  hydrALAZINE (APRESOLINE) 100 MG tablet Take 1 tablet by mouth twice a day take after HD on dialysis days 08/31/17   [provider]  isosorbide mononitrate (IMDUR)  30 MG 24 hr tablet Take 1 tablet (30 mg total) by mouth daily. Patient taking differently: Take 30 mg by mouth daily. ER 06/17/17   [name], MD  lactulose [facility]) 10 GM/15ML solution Take twice a day on NON-dialysis days and once a day on dialysis days Patient not taking: Reported on 12/06/2017 12/01/17   Rai, Vernelle Emerald, MD  loratadine (CLARITIN) 10 MG tablet Take 1 tablet (10 mg total) by mouth daily. 10/31/17   Eugenie Filler, MD  Nutritional Supplements (FEEDING SUPPLEMENT, NEPRO CARB STEADY,) LIQD Take 237 mLs by mouth 2 (two) times daily between meals. 09/30/17   Mariel Aloe, MD  Oxycodone HCl 10 MG TABS Take 1 tablet (10 mg total) by mouth every 6 (six) hours as needed. Patient taking differently: Take 10 mg by mouth every 6 (six) hours as needed (for pain).  11/22/17   Rhyne, Hulen Shouts, PA-C  pantoprazole (PROTONIX) 40 MG tablet Take 1 tablet (40 mg total) by mouth daily. Take 30-60 min before first meal of the day 09/08/17   Tanda Rockers, MD  predniSONE (DELTASONE) 10 MG tablet Take 10 mg by mouth 2 (two) times daily.    [provider]  sevelamer carbonate (RENVELA) 800 MG tablet Take 1,600-3,200 mg by mouth See admin instructions. Take 4 tablets (800mg  each) three times daily and 2 tablets twice daily with a snack. 10/31/17   [provider]  tiotropium (SPIRIVA) 18 MCG inhalation capsule Place 18 mcg into inhaler and inhale  daily.    [provider]  Vancomycin (VANCOCIN) 750-5 MG/150ML-% SOLN Inject 150 mLs (750 mg total) into the vein every Monday, Wednesday, and Friday with hemodialysis. For 10days 12/02/17   Rai, Vernelle Emerald, MD    Family History Family History  Problem Relation Age of Onset  . Heart disease Mother   . Lung cancer Mother   . Heart disease Father   . Malignant hyperthermia Father   . COPD Father   . Throat cancer Sister   . Esophageal cancer Sister   . Hypertension Other   . COPD Other   . Colon cancer Neg Hx   . Colon polyps Neg Hx   . Rectal cancer Neg Hx   . Stomach cancer Neg Hx     Social History Social History   Tobacco Use  . Smoking status: Current Every Day Smoker    Packs/day: 0.50    Years: 43.00    Pack years: 21.50    Types: Cigarettes    Start date: 08/13/1973  . Smokeless tobacco: Never Used  Substance Use Topics  . Alcohol use: Not Currently    Frequency: Never    Comment: quit drinking in 2017  . Drug use: Not Currently    Comment: quit in 2017"     Allergies   Aspirin; Clonidine derivatives; Tramadol; and Tylenol [acetaminophen]   Review of Systems Review of Systems  Constitutional: Negative for chills and fever.  Respiratory: Negative for chest tightness and shortness of breath.   Cardiovascular: Negative for chest pain and palpitations.  Gastrointestinal: Positive for abdominal pain. Negative for abdominal distention, diarrhea, nausea and vomiting.  All other systems reviewed and are negative.    Physical Exam Updated Vital Signs BP 114/65 (BP Location: Left Arm)   Pulse (!) 124   Temp 98.5 F (36.9 C) (Oral)   Resp 16   SpO2 99%   Physical Exam  Constitutional:  Chronically ill-appearing 57 year old male  HENT:  Head: Normocephalic and  atraumatic.  Mouth/Throat: Oropharynx is clear and moist.  Eyes: Pupils are equal, round, and reactive to light. Conjunctivae and EOM are normal.  Neck: Normal range of motion. Neck  supple.  Cardiovascular: Normal rate, regular rhythm, S1 normal and S2 normal.  Murmur heard. Pulses:      Radial pulses are 2+ on the right side, and 2+ on the left side.       Dorsalis pedis pulses are 2+ on the right side, and 2+ on the left side.  SEM present. No lower extremity edema.  Pulmonary/Chest: Effort normal. He has no wheezes. He has rales.  Coarse crackles in bilateral lung bases.  Abdominal: He exhibits no distension. There is tenderness. There is no guarding.  Abdomen distended, but tympanitic to percussion.  No peritoneal signs.  Patient diffusely tender to light palpation in the periumbilical region.  Musculoskeletal: Normal range of motion. He exhibits no edema or deformity.  Neurological: He is alert.  Cranial nerves grossly intact. Patient moves extremities symmetrically and with good coordination.  Skin: Skin is warm and dry. No rash noted. No erythema.  Psychiatric: He has a normal mood and affect. His behavior is normal. Judgment and thought content normal.  Nursing note and vitals reviewed.    ED Treatments / Results  Labs (all labs ordered are listed, but only abnormal results are displayed) Labs Reviewed  COMPREHENSIVE METABOLIC PANEL  TROPONIN I  CBC WITH DIFFERENTIAL/PLATELET  LIPASE, BLOOD  PROTIME-INR  I-STAT CG4 LACTIC ACID, ED    EKG None  Radiology Dg Chest 2 View  Result Date: 12/06/2017 CLINICAL DATA:  Two weeks of abdominal pain and chest tightness. In-states dialysis dependent renal failure. History of coronary artery disease and COPD. EXAM: CHEST - 2 VIEW COMPARISON:  PA and lateral chest x-ray of Nov 15, 2017 FINDINGS: The lungs are adequately inflated. The interstitial markings are mildly prominent but this is not entirely new. The cardiac silhouette is enlarged and more conspicuous today. The pulmonary vascularity is more engorged. There is calcification in the wall of the thoracic aorta. A dual-lumen dialysis catheter terminates at  the cavoatrial junction. The observed bony thorax exhibits no acute abnormality. IMPRESSION: Mild CHF which is likely both acute and chronic. No alveolar pneumonia. Thoracic aortic atherosclerosis. Electronically Signed   By: David  Martinique M.D.   On: 12/06/2017 12:59   Ir Paracentesis  Result Date: 12/06/2017 INDICATION: History of hepatitis C. Chronic renal insufficiency on dialysis. Abdominal distention secondary to recurrent ascites. Request for diagnostic and therapeutic paracentesis. EXAM: ULTRASOUND GUIDED LEFT LOWER QUADRANT PARACENTESIS MEDICATIONS: None. COMPLICATIONS: None immediate. PROCEDURE: Informed written consent was obtained from the patient after a discussion of the risks, benefits and alternatives to treatment. A timeout was performed prior to the initiation of the procedure. Initial ultrasound scanning demonstrates a moderate amount of ascites within the left lower abdominal quadrant. The left lower abdomen was prepped and draped in the usual sterile fashion. 1% lidocaine with epinephrine was used for local anesthesia. Following this, a 19 gauge, 7-cm, Yueh catheter was introduced. An ultrasound image was saved for documentation purposes. The paracentesis was performed. The catheter was removed and a dressing was applied. The patient tolerated the procedure well without immediate post procedural complication. FINDINGS: A total of approximately 1.7 L of hazy, amber colored fluid was removed. Samples were sent to the laboratory as requested by the clinical team. IMPRESSION: Successful ultrasound-guided paracentesis yielding 1.7 liters of peritoneal fluid. Read by: Ascencion Dike PA-C Electronically Signed   By:  Corrie Mckusick D.O.   On: 12/06/2017 16:03    Procedures Procedures (including critical care time)  Medications Ordered in ED Medications  fentaNYL (SUBLIMAZE) injection 50 mcg (has no administration in time range)     Initial Impression / Assessment and Plan / ED Course  I  have reviewed the triage vital signs and the nursing notes.  Pertinent labs & imaging results that were available during my care of the patient were reviewed by me and considered in my medical decision making (see chart for details).  Clinical Course as of Dec 07 1556  Wed Dec 07, 2017  1547 Spoke with Dr. Jonnie Finner of nephrology, who states that patient can be evaluated with CT scan with contrast, not requiring oral contrast, and does not need to be dialyzed on an accelerated schedule after receiving contrast load.   [AM]    Clinical Course User Index [AM] Albesa Seen, PA-C   Patient nontoxic-appearing, but is persistently tachycardic, which is new from his presentation yesterday.  Patient does not have a peritoneal abdomen, but differential diagnosis includes diverticulitis,  colitis, toxic megacolon.  Given that patient has a recent history of C. difficile colitis 1 month ago.  Per note from Dr. Tana Coast at discharge on 12/01/2017, patient should be on ciprofloxacin 500 mg daily for prophylaxis for SBP.  Patient was discharged on 12-01-2017 for SBP, and was treated for a total of 10 days with vancomycin and Fortaz last day Monday, 12-05-2017.   We will repeat lab work from yesterday.  If patient has significant changes, higher suspicion for intra-abdominal pathology.  Given that patient does not have significantly impressive paracentesis fluid for SBP pulled yesterday compared to earlier in May 2019, therefore lower suspicion that this is acute exacerbation of spontaneous pectoral peritonitis.  Patient given 1 dose of fentanyl in emergency department.  Unless significant pathology revealed, patient may have home medications for his abdominal pain.  4:05 PM care signed out to Carlisle Cater, PA-C to follow-up on results.  Final Clinical Impressions(s) / ED Diagnoses   Final diagnoses:  Generalized abdominal pain  ESRD (end stage renal disease) [facility])    ED Discharge Orders    None         Tamala Julian 12/07/17 1653    Mesner, Corene Cornea, MD 12/08/17 1011

## 2017-12-07 NOTE — ED Provider Notes (Signed)
Medical screening examination/treatment/procedure(s) were conducted as a shared visit with non-physician practitioner(s) and myself.  I personally evaluated the patient during the encounter.  57 year old male patient well-known to the emergency room with 18 visits in the last 6 months presents to the emergency department today for persistent chest pain abdominal pain similar to the way it was yesterday in quality but worse in severity.  Seems like patient has a history of SBP and finish his antibiotics recently but is a very poor historian so unclear if x-ray took his antibiotics.  He was seen yesterday and had a paracentesis were 1.7 L were improved and he reportedly felt better but today the pain is worse than it was yesterday.  He is also having tachycardia now on my exam he does have a distended abdomen is diffusely tender without signs of peritonitis at this point.  His heart rates 115 at rest and is slightly tachypneic.  No obvious fluid wave.  No fever. As he had fluid drained yesterday and just completed a course for SBP seems that this was little bit less likely at this point.  With his comorbidities and history of C. difficile and other medical problems consider colitis, diverticulitis, toxic megacolon or other abdominal emergencies so plan for CT scan.  Will discuss with nephrology about appropriate study and follow-up dialysis.  EKG Interpretation  Date/Time:  Wednesday Dec 07 2017 13:38:42 EDT Ventricular Rate:  126 PR Interval:  192 QRS Duration: 158 QT Interval:  394 QTC Calculation: 570 R Axis:   11 Text Interpretation:  Sinus tachycardia Left bundle branch block Abnormal ECG similar morphology to yesterday Confirmed by Merrily Pew [phone]) on 12/07/2017 3:55:39 PM     Adri Schloss, Corene Cornea, MD 12/08/17 1009

## 2017-12-07 NOTE — ED Notes (Signed)
Pt has bilateral fistulas, EMS States that B/P can be checked on his left arm due to the fact that the fistula on left arm is no longer working.

## 2017-12-09 DIAGNOSIS — A499 Bacterial infection, unspecified: Secondary | ICD-10-CM | POA: Diagnosis not present

## 2017-12-09 DIAGNOSIS — D509 Iron deficiency anemia, unspecified: Secondary | ICD-10-CM | POA: Diagnosis not present

## 2017-12-09 DIAGNOSIS — N186 End stage renal disease: Secondary | ICD-10-CM | POA: Diagnosis not present

## 2017-12-09 DIAGNOSIS — N2581 Secondary hyperparathyroidism of renal origin: Secondary | ICD-10-CM | POA: Diagnosis not present

## 2017-12-09 DIAGNOSIS — K652 Spontaneous bacterial peritonitis: Secondary | ICD-10-CM | POA: Diagnosis not present

## 2017-12-09 DIAGNOSIS — D631 Anemia in chronic kidney disease: Secondary | ICD-10-CM | POA: Diagnosis not present

## 2017-12-10 DIAGNOSIS — I120 Hypertensive chronic kidney disease with stage 5 chronic kidney disease or end stage renal disease: Secondary | ICD-10-CM | POA: Diagnosis not present

## 2017-12-10 DIAGNOSIS — Z992 Dependence on renal dialysis: Secondary | ICD-10-CM | POA: Diagnosis not present

## 2017-12-10 DIAGNOSIS — N186 End stage renal disease: Secondary | ICD-10-CM | POA: Diagnosis not present

## 2017-12-11 LAB — CULTURE, BODY FLUID W GRAM STAIN -BOTTLE: Culture: NO GROWTH

## 2017-12-12 DIAGNOSIS — D631 Anemia in chronic kidney disease: Secondary | ICD-10-CM | POA: Diagnosis not present

## 2017-12-12 DIAGNOSIS — N2581 Secondary hyperparathyroidism of renal origin: Secondary | ICD-10-CM | POA: Diagnosis not present

## 2017-12-12 DIAGNOSIS — N186 End stage renal disease: Secondary | ICD-10-CM | POA: Diagnosis not present

## 2017-12-12 DIAGNOSIS — D509 Iron deficiency anemia, unspecified: Secondary | ICD-10-CM | POA: Diagnosis not present

## 2017-12-13 ENCOUNTER — Ambulatory Visit: Payer: Medicare Other | Admitting: Physician Assistant

## 2017-12-13 DIAGNOSIS — Z79891 Long term (current) use of opiate analgesic: Secondary | ICD-10-CM | POA: Diagnosis not present

## 2017-12-13 DIAGNOSIS — M79602 Pain in left arm: Secondary | ICD-10-CM | POA: Diagnosis not present

## 2017-12-13 DIAGNOSIS — G8929 Other chronic pain: Secondary | ICD-10-CM | POA: Diagnosis not present

## 2017-12-14 DIAGNOSIS — D631 Anemia in chronic kidney disease: Secondary | ICD-10-CM | POA: Diagnosis not present

## 2017-12-14 DIAGNOSIS — N186 End stage renal disease: Secondary | ICD-10-CM | POA: Diagnosis not present

## 2017-12-14 DIAGNOSIS — N2581 Secondary hyperparathyroidism of renal origin: Secondary | ICD-10-CM | POA: Diagnosis not present

## 2017-12-14 DIAGNOSIS — D509 Iron deficiency anemia, unspecified: Secondary | ICD-10-CM | POA: Diagnosis not present

## 2017-12-15 ENCOUNTER — Encounter: Payer: Self-pay | Admitting: Vascular Surgery

## 2017-12-15 ENCOUNTER — Ambulatory Visit (INDEPENDENT_AMBULATORY_CARE_PROVIDER_SITE_OTHER): Payer: Self-pay | Admitting: Vascular Surgery

## 2017-12-15 VITALS — BP 145/70 | HR 83 | Temp 97.5°F | Resp 20 | Ht 69.0 in | Wt 152.6 lb

## 2017-12-15 DIAGNOSIS — B1910 Unspecified viral hepatitis B without hepatic coma: Secondary | ICD-10-CM | POA: Diagnosis not present

## 2017-12-15 DIAGNOSIS — Z992 Dependence on renal dialysis: Secondary | ICD-10-CM

## 2017-12-15 DIAGNOSIS — F419 Anxiety disorder, unspecified: Secondary | ICD-10-CM | POA: Diagnosis not present

## 2017-12-15 DIAGNOSIS — N186 End stage renal disease: Secondary | ICD-10-CM

## 2017-12-15 DIAGNOSIS — I2510 Atherosclerotic heart disease of native coronary artery without angina pectoris: Secondary | ICD-10-CM | POA: Diagnosis not present

## 2017-12-15 DIAGNOSIS — B182 Chronic viral hepatitis C: Secondary | ICD-10-CM | POA: Diagnosis not present

## 2017-12-15 DIAGNOSIS — J449 Chronic obstructive pulmonary disease, unspecified: Secondary | ICD-10-CM | POA: Diagnosis not present

## 2017-12-15 DIAGNOSIS — I10 Essential (primary) hypertension: Secondary | ICD-10-CM | POA: Diagnosis not present

## 2017-12-15 DIAGNOSIS — G4709 Other insomnia: Secondary | ICD-10-CM | POA: Diagnosis not present

## 2017-12-15 DIAGNOSIS — I480 Paroxysmal atrial fibrillation: Secondary | ICD-10-CM | POA: Diagnosis not present

## 2017-12-15 DIAGNOSIS — Z720 Tobacco use: Secondary | ICD-10-CM | POA: Diagnosis not present

## 2017-12-15 DIAGNOSIS — D638 Anemia in other chronic diseases classified elsewhere: Secondary | ICD-10-CM | POA: Diagnosis not present

## 2017-12-15 NOTE — Progress Notes (Signed)
Patient is a 57 year old male who returns for follow-up today after placement of right upper arm AV graft about 3 weeks ago.  He denies any numbness or tingling in his hand.  He has irritation at his current right side catheter site.  He would like to get the catheter out.  Physical exam:  Vitals:   12/15/17 1254 12/15/17 1255  BP: (!) 148/73 (!) 145/70  Pulse: 83   Resp: 20   Temp: (!) 97.5 F (36.4 C)   TempSrc: Oral   SpO2: 98%   Weight: 152 lb 9.6 oz (69.2 kg)   Height: 5\' 9"  (1.753 m)     Right upper extremity: Audible bruit palpable thrill in graft well-healed incisions, right side catheter no drainage  Assessment: Patent right upper arm AV graft.  Begin cannulation Monday, December 19, 2017.  Plan: The patient will follow-up on an as-needed basis.  Ruta Hinds, MD [facility] Office: [phone] Pager: [phone]

## 2017-12-16 DIAGNOSIS — D631 Anemia in chronic kidney disease: Secondary | ICD-10-CM | POA: Diagnosis not present

## 2017-12-16 DIAGNOSIS — D509 Iron deficiency anemia, unspecified: Secondary | ICD-10-CM | POA: Diagnosis not present

## 2017-12-16 DIAGNOSIS — N186 End stage renal disease: Secondary | ICD-10-CM | POA: Diagnosis not present

## 2017-12-16 DIAGNOSIS — N2581 Secondary hyperparathyroidism of renal origin: Secondary | ICD-10-CM | POA: Diagnosis not present

## 2017-12-18 ENCOUNTER — Emergency Department (HOSPITAL_COMMUNITY)
Admission: EM | Admit: 2017-12-18 | Discharge: 2017-12-19 | Disposition: A | Discharge status: Home / Self Care | Payer: Medicare Other | Attending: Emergency Medicine | Admitting: Emergency Medicine

## 2017-12-18 DIAGNOSIS — Z992 Dependence on renal dialysis: Secondary | ICD-10-CM | POA: Diagnosis not present

## 2017-12-18 DIAGNOSIS — Z9889 Other specified postprocedural states: Secondary | ICD-10-CM | POA: Diagnosis not present

## 2017-12-18 DIAGNOSIS — Z7901 Long term (current) use of anticoagulants: Secondary | ICD-10-CM | POA: Insufficient documentation

## 2017-12-18 DIAGNOSIS — R109 Unspecified abdominal pain: Secondary | ICD-10-CM | POA: Diagnosis present

## 2017-12-18 DIAGNOSIS — R52 Pain, unspecified: Secondary | ICD-10-CM | POA: Diagnosis not present

## 2017-12-18 DIAGNOSIS — K7031 Alcoholic cirrhosis of liver with ascites: Secondary | ICD-10-CM | POA: Diagnosis not present

## 2017-12-18 DIAGNOSIS — R188 Other ascites: Secondary | ICD-10-CM | POA: Diagnosis not present

## 2017-12-18 DIAGNOSIS — N186 End stage renal disease: Secondary | ICD-10-CM | POA: Insufficient documentation

## 2017-12-18 DIAGNOSIS — F17210 Nicotine dependence, cigarettes, uncomplicated: Secondary | ICD-10-CM | POA: Insufficient documentation

## 2017-12-18 DIAGNOSIS — Z66 Do not resuscitate: Secondary | ICD-10-CM | POA: Insufficient documentation

## 2017-12-18 DIAGNOSIS — R1084 Generalized abdominal pain: Secondary | ICD-10-CM | POA: Diagnosis not present

## 2017-12-18 DIAGNOSIS — I120 Hypertensive chronic kidney disease with stage 5 chronic kidney disease or end stage renal disease: Secondary | ICD-10-CM | POA: Insufficient documentation

## 2017-12-18 DIAGNOSIS — R0602 Shortness of breath: Secondary | ICD-10-CM | POA: Diagnosis not present

## 2017-12-18 DIAGNOSIS — Z79899 Other long term (current) drug therapy: Secondary | ICD-10-CM | POA: Diagnosis not present

## 2017-12-18 NOTE — ED Triage Notes (Signed)
Pt BIB [facility] for abdominal pain. Patient is MWF dialysis patinet. States he has not missed a treatment. Pt c/o of RUQ and LUQ pain along with pain belly the umbilicus. Pt states he has a leaky liver and has had to have pericentsis before. Suppose to be taking ABX for the "leaky liver" but has missed several days. Patient also c/o [facility]

## 2017-12-19 ENCOUNTER — Emergency Department (HOSPITAL_COMMUNITY): Payer: Medicare Other

## 2017-12-19 DIAGNOSIS — R188 Other ascites: Secondary | ICD-10-CM | POA: Diagnosis not present

## 2017-12-19 DIAGNOSIS — R0602 Shortness of breath: Secondary | ICD-10-CM | POA: Diagnosis not present

## 2017-12-19 DIAGNOSIS — K7031 Alcoholic cirrhosis of liver with ascites: Secondary | ICD-10-CM | POA: Diagnosis not present

## 2017-12-19 LAB — COMPREHENSIVE METABOLIC PANEL
ALT: 16 U/L — ABNORMAL LOW (ref 17–63)
AST: 19 U/L (ref 15–41)
Albumin: 3.1 g/dL — ABNORMAL LOW (ref 3.5–5.0)
Alkaline Phosphatase: 211 U/L — ABNORMAL HIGH (ref 38–126)
Anion gap: 15 (ref 5–15)
BUN: 31 mg/dL — ABNORMAL HIGH (ref 6–20)
CO2: 22 mmol/L (ref 22–32)
Calcium: 8.1 mg/dL — ABNORMAL LOW (ref 8.9–10.3)
Chloride: 96 mmol/L — ABNORMAL LOW (ref 101–111)
Creatinine, Ser: 7.7 mg/dL — ABNORMAL HIGH (ref 0.61–1.24)
GFR calc Af Amer: 8 mL/min — ABNORMAL LOW (ref >60)
GFR calc non Af Amer: 7 mL/min — ABNORMAL LOW (ref >60)
Glucose, Bld: 123 mg/dL — ABNORMAL HIGH (ref 65–99)
Potassium: 4.6 mmol/L (ref 3.5–5.1)
Sodium: 133 mmol/L — ABNORMAL LOW (ref 135–145)
Total Bilirubin: 0.7 mg/dL (ref 0.3–1.2)
Total Protein: 6.9 g/dL (ref 6.5–8.1)

## 2017-12-19 LAB — CBC
HCT: 26.5 % — ABNORMAL LOW (ref 39.0–52.0)
Hemoglobin: 8.7 g/dL — ABNORMAL LOW (ref 13.0–17.0)
MCH: 24.9 pg — ABNORMAL LOW (ref 26.0–34.0)
MCHC: 32.8 g/dL (ref 30.0–36.0)
MCV: 75.9 fL — ABNORMAL LOW (ref 78.0–100.0)
Platelets: 151 10*3/uL (ref 150–400)
RBC: 3.49 MIL/uL — ABNORMAL LOW (ref 4.22–5.81)
RDW: 22.6 % — ABNORMAL HIGH (ref 11.5–15.5)
WBC: 9.2 10*3/uL (ref 4.0–10.5)

## 2017-12-19 LAB — LIPASE, BLOOD: Lipase: 39 U/L (ref 11–51)

## 2017-12-19 MED ORDER — LIDOCAINE HCL (PF) 2 % IJ SOLN
INTRAMUSCULAR | Status: AC
  Filled 2017-12-19: qty 10

## 2017-12-19 MED ORDER — HYDROMORPHONE HCL 2 MG/ML IJ SOLN
0.5000 mg | Freq: Once | INTRAMUSCULAR | Status: AC
  Administered 2017-12-19: 0.5 mg via INTRAVENOUS
  Filled 2017-12-19: qty 1

## 2017-12-19 MED ORDER — LIDOCAINE HCL (PF) 2 % IJ SOLN
INTRAMUSCULAR | Status: AC | PRN
  Administered 2017-12-19: 10 mL

## 2017-12-19 MED ORDER — HYDROMORPHONE HCL 2 MG/ML IJ SOLN: 1.0000 mg | Freq: Once | INTRAMUSCULAR | Status: DC

## 2017-12-19 MED ORDER — HYDROMORPHONE HCL 2 MG/ML IJ SOLN
0.5000 mg | Freq: Once | INTRAMUSCULAR | Status: AC
Start: 2017-12-19 — End: 2017-12-19
  Administered 2017-12-19: 0.5 mg via INTRAVENOUS
  Filled 2017-12-19: qty 1

## 2017-12-19 NOTE — Progress Notes (Signed)
IR requested by Dr. Christy Gentles for possible image-guided paracentesis.  Ultrasound limited abdomen revealed little to no fluid that could be safely accessed. Ultrasound images reviewed with Dr. Kathlene Cote who agrees with findings.  Procedure will not be preformed today. IR available if needed in future.  Bea Graff Louk, PA-C 12/19/2017, 11:09 AM

## 2017-12-19 NOTE — ED Notes (Signed)
Patient informed he is now NPO for paracentesis in the morning.

## 2017-12-19 NOTE — Discharge Instructions (Addendum)
Follow-up with your primary care doctor or return if worse.

## 2017-12-19 NOTE — ED Provider Notes (Signed)
Patient is status post paracentesis.  He has had multiple similar procedures in the past.  He is hemodynamically stable and wants to be discharged home.   Nat Christen, MD 12/19/17 1134

## 2017-12-19 NOTE — ED Notes (Signed)
Pt transported to IR 

## 2017-12-19 NOTE — ED Provider Notes (Signed)
[facility] EMERGENCY DEPARTMENT Provider Note   CSN: [id number] Arrival date & time: 12/18/17  2330     History   Chief Complaint Chief Complaint  Patient presents with  . Abdominal Pain    HPI [name] is a 57 y.o. male.  The history is provided by the patient.  Abdominal Pain   This is a recurrent problem. The current episode started more than 2 days ago. The problem occurs daily. The problem has been gradually worsening. The pain is located in the generalized abdominal region. The pain is moderate. Associated symptoms include diarrhea. Pertinent negatives include fever, hematochezia, melena and vomiting. Associated symptoms comments: Diarrhea from lactulose. The symptoms are aggravated by certain positions. Nothing relieves the symptoms.  Patient with extensive medical conditions including end-stage renal disease, CAD, COPD, cirrhosis, history of previous alcohol abuse. He reports he had recurrent abdominal pain.  He reports he feels that his liver is leaking fluid.  He reports is supposed to be taking antibiotics daily for this but has missed the past several days.  He also reports increasing shortness of breath.  He denies any missed dialysis sessions.  He gets dialysis Monday/Wednesday/Friday  Past Medical History:  Diagnosis Date  . Anemia   . Anxiety   . Arthritis    left shoulder  . Atherosclerosis of aorta ([place])   . Cardiomegaly   . Chest pain    DATE UNKNOWN, C/O PERIODICALLY  . Cocaine abuse ([place])   . COPD exacerbation ([place]) 08/17/2016  . Coronary artery disease    stent 02/22/17  . ESRD (end stage renal disease) on dialysis ([place])    "[name]; MWF" (07/04/2017)  . GERD (gastroesophageal reflux disease)    DATE UNKNOWN  . Hemorrhoids   . Hepatitis B, chronic ([place])   . Hepatitis C   . History of kidney stones   . Hyperkalemia   . Hypertension   . Metabolic bone disease    Patient denies  . Mitral stenosis   . Myocardial  infarction ([place])   . Pneumonia   . Pulmonary edema   . Solitary rectal ulcer syndrome 07/2017   at [facility] for rectal bleeding  . Tubular adenoma of colon     Patient Active Problem List   Diagnosis Date Noted  . ESRD (end stage renal disease) ([place]) 11/21/2017  . GERD (gastroesophageal reflux disease) 11/16/2017  . Liver cirrhosis ([place]) 11/15/2017  . DNR (do not resuscitate)   . Palliative care by specialist   . Hyponatremia 11/04/2017  . SBP (spontaneous bacterial peritonitis) ([place]) 10/30/2017  . Liver disease, chronic 10/30/2017  . SOB (shortness of breath)   . Abdominal pain 10/28/2017  . Upper airway cough syndrome with flattening on f/v loop 10/13/17 c/w vcd 10/17/2017  . Elevated diaphragm 10/13/2017  . Ileus ([place]) 09/29/2017  . Malnutrition of moderate degree 09/29/2017  . Sinus congestion 09/03/2017  . Symptomatic anemia 09/02/2017  . Other cirrhosis of liver ([place]) 09/02/2017  . Left bundle branch block 09/02/2017  . Mitral stenosis 09/02/2017  . Hematochezia 07/15/2017  . Wide-complex tachycardia ([place])   . Endotracheally intubated   . ESRD on dialysis ([place]) 07/04/2017  . Acute respiratory failure with hypoxia ([place]) 06/18/2017  . CKD (chronic kidney disease) stage V requiring chronic dialysis ([place]) 06/18/2017  . History of Cocaine abuse ([place]) 06/18/2017  . Hypertension 06/18/2017  . Infection of AV graft for dialysis ([place]) 06/18/2017  . Anxiety 06/18/2017  . Anemia due to chronic kidney  disease 06/18/2017  . Atrial flutter with rapid ventricular response ([place]) 06/18/2017  . Personality disorder ([place]) 06/13/2017  . Cellulitis 06/12/2017  . Adjustment disorder with mixed anxiety and depressed mood 06/10/2017  . Suicidal ideation 06/10/2017  . Arm wound, left, sequela 06/10/2017  . Dyspnea on exertion 05/29/2017  . Tachycardia 05/29/2017  . Hyperkalemia 05/22/2017  . Acute metabolic encephalopathy   . Anemia 04/23/2017  . Ascites 04/23/2017  . COPD (chronic  obstructive pulmonary disease) ([place]) 04/23/2017  . Acute on chronic respiratory failure with hypoxia ([place]) 03/25/2017  . Arrhythmia 03/25/2017  . COPD GOLD 0 with flattening on inps f/v  09/27/2016  . Essential hypertension 09/27/2016  . Fluid overload 08/30/2016  . COPD exacerbation ([place]) 08/17/2016  . Hypertensive urgency 08/17/2016  . Respiratory failure ([place]) 08/17/2016  . Problem with dialysis access ([place]) 07/23/2016  . Chronic hepatitis B ([place]) 03/05/2014  . Chronic hepatitis C without hepatic coma ([place]) 03/05/2014  . Internal hemorrhoids with bleeding, swelling and itching 03/05/2014  . Thrombocytopenia ([place]) 03/05/2014  . Chest pain 02/27/2014  . Alcohol abuse 04/14/2009  . Cigarette smoker 04/14/2009  . GANGLION CYST 04/14/2009    Past Surgical History:  Procedure Laterality Date  . A/V FISTULAGRAM Left 05/26/2017   Procedure: A/V FISTULAGRAM;  Surgeon: Conrad Everest, MD;  Location: [facility];  Service: Cardiovascular;  Laterality: Left;  . A/V FISTULAGRAM Right 11/18/2017   Procedure: A/V FISTULAGRAM - Right Arm;  Surgeon: Elam Dutch, MD;  Location: [facility];  Service: Cardiovascular;  Laterality: Right;  . APPLICATION OF WOUND VAC Left 06/14/2017   Procedure: APPLICATION OF WOUND VAC;  Surgeon: Katha Cabal, MD;  Location: [facility];  Service: Vascular;  Laterality: Left;  . AV FISTULA PLACEMENT  2012   BELIEVED WAS PLACED IN JUNE  . AV FISTULA PLACEMENT Right 08/09/2017   Procedure: Creation Right arm ARTERIOVENOUS BRACHIOCEPOHALIC FISTULA;  Surgeon: Elam Dutch, MD;  Location: [facility] OR;  Service: Vascular;  Laterality: Right;  . AV FISTULA PLACEMENT Right 11/22/2017   Procedure: INSERTION OF ARTERIOVENOUS (AV) GORE-TEX GRAFT RIGHT UPPER ARM;  Surgeon: Elam Dutch, MD;  Location: [facility];  Service: Vascular;  Laterality: Right;  . COLONOSCOPY    . CORONARY STENT INTERVENTION N/A 02/22/2017   Procedure: CORONARY STENT INTERVENTION;   Surgeon: Nigel Mormon, MD;  Location: [facility];  Service: Cardiovascular;  Laterality: N/A;  . FLEXIBLE SIGMOIDOSCOPY N/A 07/15/2017   Procedure: FLEXIBLE SIGMOIDOSCOPY;  Surgeon: Carol Ada, MD;  Location: [facility];  Service: Endoscopy;  Laterality: N/A;  . HEMORRHOID BANDING    . I&D EXTREMITY Left 06/01/2017   Procedure: IRRIGATION AND DEBRIDEMENT LEFT ARM HEMATOMA WITH LIGATION OF LEFT ARM AV FISTULA;  Surgeon: Elam Dutch, MD;  Location: [facility];  Service: Vascular;  Laterality: Left;  . I&D EXTREMITY Left 06/14/2017   Procedure: IRRIGATION AND DEBRIDEMENT EXTREMITY;  Surgeon: Katha Cabal, MD;  Location: [facility];  Service: Vascular;  Laterality: Left;  . INSERTION OF DIALYSIS CATHETER  05/30/2017  . INSERTION OF DIALYSIS CATHETER N/A 05/30/2017   Procedure: INSERTION OF DIALYSIS CATHETER;  Surgeon: Elam Dutch, MD;  Location: [facility];  Service: Vascular;  Laterality: N/A;  . IR PARACENTESIS  08/30/2017  . IR PARACENTESIS  09/29/2017  . IR PARACENTESIS  10/28/2017  . IR PARACENTESIS  11/09/2017  . IR PARACENTESIS  11/16/2017  . IR PARACENTESIS  11/28/2017  . IR PARACENTESIS  12/01/2017  . IR  PARACENTESIS  12/06/2017  . LEFT HEART CATH AND CORONARY ANGIOGRAPHY N/A 02/22/2017   Procedure: LEFT HEART CATH AND CORONARY ANGIOGRAPHY;  Surgeon: Nigel Mormon, MD;  Location: [facility];  Service: Cardiovascular;  Laterality: N/A;  . LIGATION OF ARTERIOVENOUS  FISTULA Left 10/18/7024   Procedure: Plication of Left Arm Arteriovenous Fistula;  Surgeon: Elam Dutch, MD;  Location: [facility];  Service: Vascular;  Laterality: Left;  . POLYPECTOMY    . REVISON OF ARTERIOVENOUS FISTULA Left 3/78/5885   Procedure: PLICATION OF DISTAL ANEURYSMAL SEGEMENT OF LEFT UPPER ARM ARTERIOVENOUS FISTULA;  Surgeon: Elam Dutch, MD;  Location: [facility];  Service: Vascular;  Laterality: Left;  . REVISON OF ARTERIOVENOUS FISTULA Left 0/27/7412   Procedure: Plication of Left  Upper Arm Fistula ;  Surgeon: Waynetta Sandy, MD;  Location: [facility];  Service: Vascular;  Laterality: Left;  . SKIN GRAFT SPLIT THICKNESS LEG / FOOT Left    SKIN GRAFT SPLIT THICKNESS LEFT ARM DONOR SITE: LEFT ANTERIOR THIGH  . SKIN SPLIT GRAFT Left 07/04/2017   Procedure: SKIN GRAFT SPLIT THICKNESS LEFT ARM DONOR SITE: LEFT ANTERIOR THIGH;  Surgeon: Elam Dutch, MD;  Location: [facility];  Service: Vascular;  Laterality: Left;  . THROMBECTOMY W/ EMBOLECTOMY Left 06/05/2017   Procedure: EXPLORATION OF LEFT ARM FOR BLEEDING; OVERSEWED PROXIMAL FISTULA;  Surgeon: Angelia Mould, MD;  Location: [facility];  Service: Vascular;  Laterality: Left;  . WOUND EXPLORATION Left 06/03/2017   Procedure: WOUND EXPLORATION WITH WOUND VAC APPLICATION TO LEFT ARM;  Surgeon: Angelia Mould, MD;  Location: [facility];  Service: Vascular;  Laterality: Left;        Home Medications    Prior to Admission medications   Medication Sig Start Date End Date Taking? Authorizing Provider  albuterol (VENTOLIN HFA) 108 (90 Base) MCG/ACT inhaler Inhale 2 puffs into the lungs every 6 (six) hours as needed for wheezing or shortness of breath.    [provider]  ALPRAZolam Duanne Moron) 0.25 MG tablet Take 1 tablet (0.25 mg total) by mouth 2 (two) times daily as needed for anxiety. 12/01/17   Rai, Vernelle Emerald, MD  budesonide-formoterol (SYMBICORT) 80-4.5 MCG/ACT inhaler Inhale 2 puffs into the lungs 2 (two) times daily. Patient not taking: Reported on 12/06/2017 10/13/17   Tanda Rockers, MD  Ceftazidime (FORTAZ) 2 g SOLR injection Inject 2 g into the vein every other day. For 10days 12/01/17   Rai, Vernelle Emerald, MD  ciprofloxacin (CIPRO) 500 MG tablet Take 1 tablet (500 mg total) by mouth at bedtime. Once completed IV antibiotics with dialysis, then start ciprofloxacin for prophylaxis 500mg  qhs 12/06/17   Rai, Ripudeep K, MD  clopidogrel (PLAVIX) 75 MG tablet Take 1 tablet (75 mg total) by mouth daily. 07/15/17    Rhyne, Hulen Shouts, PA-C  diltiazem (CARDIZEM CD) 120 MG 24 hr capsule Take 1 capsule (120 mg total) by mouth daily. 06/23/17 06/23/18  Deatra James, MD  famotidine (PEPCID) 20 MG tablet One at bedtime Patient taking differently: Take 20 mg by mouth at bedtime.  09/08/17   Tanda Rockers, MD  fluticasone (FLONASE) 50 MCG/ACT nasal spray Place 2 sprays into both nostrils daily. 10/31/17   Eugenie Filler, MD  hydrALAZINE (APRESOLINE) 100 MG tablet Take 1 tablet by mouth twice a day take after HD on dialysis days 08/31/17   [provider]  isosorbide mononitrate (IMDUR) 30 MG 24 hr tablet Take 1 tablet (30 mg total) by mouth  daily. Patient taking differently: Take 30 mg by mouth daily. ER 06/17/17   [name], MD  lactulose [facility]) 10 GM/15ML solution Take twice a day on NON-dialysis days and once a day on dialysis days 12/01/17   Rai, Vernelle Emerald, MD  loratadine (CLARITIN) 10 MG tablet Take 1 tablet (10 mg total) by mouth daily. 10/31/17   Eugenie Filler, MD  Nutritional Supplements (FEEDING SUPPLEMENT, NEPRO CARB STEADY,) LIQD Take 237 mLs by mouth 2 (two) times daily between meals. 09/30/17   Mariel Aloe, MD  Oxycodone HCl 10 MG TABS Take 1 tablet (10 mg total) by mouth every 6 (six) hours as needed. Patient taking differently: Take 10 mg by mouth every 6 (six) hours as needed (for pain).  11/22/17   Rhyne, Hulen Shouts, PA-C  pantoprazole (PROTONIX) 40 MG tablet Take 1 tablet (40 mg total) by mouth daily. Take 30-60 min before first meal of the day 09/08/17   Tanda Rockers, MD  predniSONE (DELTASONE) 10 MG tablet Take 10 mg by mouth 2 (two) times daily.    [provider]  sevelamer carbonate (RENVELA) 800 MG tablet Take 1,600-3,200 mg by mouth See admin instructions. Take 4 tablets (800mg  each) three times daily and 2 tablets twice daily with a snack. 10/31/17   [provider]  tiotropium (SPIRIVA) 18 MCG inhalation capsule Place 18 mcg into inhaler and  inhale daily.    [provider]  Vancomycin (VANCOCIN) 750-5 MG/150ML-% SOLN Inject 150 mLs (750 mg total) into the vein every Monday, Wednesday, and Friday with hemodialysis. For 10days 12/02/17   Rai, Vernelle Emerald, MD    Family History Family History  Problem Relation Age of Onset  . Heart disease Mother   . Lung cancer Mother   . Heart disease Father   . Malignant hyperthermia Father   . COPD Father   . Throat cancer Sister   . Esophageal cancer Sister   . Hypertension Other   . COPD Other   . Colon cancer Neg Hx   . Colon polyps Neg Hx   . Rectal cancer Neg Hx   . Stomach cancer Neg Hx     Social History Social History   Tobacco Use  . Smoking status: Current Every Day Smoker    Packs/day: 0.50    Years: 43.00    Pack years: 21.50    Types: Cigarettes    Start date: 08/13/1973  . Smokeless tobacco: Never Used  Substance Use Topics  . Alcohol use: Not Currently    Frequency: Never    Comment: quit drinking in 2017  . Drug use: Not Currently    Comment: quit in 2017"     Allergies   Aspirin; Clonidine derivatives; Tramadol; and Tylenol [acetaminophen]   Review of Systems Review of Systems  Constitutional: Negative for fever.  Respiratory: Positive for shortness of breath.   Gastrointestinal: Positive for abdominal pain and diarrhea. Negative for hematochezia, melena and vomiting.  All other systems reviewed and are negative.    Physical Exam Updated Vital Signs BP (!) 162/83 (BP Location: Left Arm)   Pulse 69   Temp 97.9 F (36.6 C) (Oral)   Resp 16   Ht 1.753 m (5\' 9" )   Wt 68.9 kg (152 lb)   SpO2 97%   BMI 22.45 kg/m   Physical Exam CONSTITUTIONAL: Chronically ill-appearing HEAD: Normocephalic/atraumatic EYES: EOMI ENMT: Mucous membranes moist NECK: supple no meningeal signs SPINE/BACK:entire spine nontender CV: S1/S2 noted, no murmurs/rubs/gallops noted LUNGS:  Lungs are clear to auscultation bilaterally ABDOMEN: soft, mild  distention, tenderness surrounding umbilicus, no hernias noted NEURO: Pt is awake/alert/appropriate, moves all extremitiesx4.  No facial droop.   EXTREMITIES: pulses normal/equal, full ROM SKIN: warm, color normal, catheter to upper chest PSYCH: no abnormalities of mood noted, alert and oriented to situation   ED Treatments / Results  Labs (all labs ordered are listed, but only abnormal results are displayed) Labs Reviewed  COMPREHENSIVE METABOLIC PANEL - Abnormal; Notable for the following components:      Result Value   Sodium 133 (*)    Chloride 96 (*)    Glucose, Bld 123 (*)    BUN 31 (*)    Creatinine, Ser 7.70 (*)    Calcium 8.1 (*)    Albumin 3.1 (*)    ALT 16 (*)    Alkaline Phosphatase 211 (*)    GFR calc non Af Amer 7 (*)    GFR calc Af Amer 8 (*)    All other components within normal limits  CBC - Abnormal; Notable for the following components:   RBC 3.49 (*)    Hemoglobin 8.7 (*)    HCT 26.5 (*)    MCV 75.9 (*)    MCH 24.9 (*)    RDW 22.6 (*)    All other components within normal limits  LIPASE, BLOOD    EKG EKG Interpretation  Date/Time:  Sunday December 18 2017 23:54:37 EDT Ventricular Rate:  71 PR Interval:  242 QRS Duration: 170 QT Interval:  512 QTC Calculation: 556 R Axis:   -101 Text Interpretation:  Sinus rhythm with 1st degree A-V block Right superior axis deviation Left bundle branch block Abnormal ECG No significant change since last tracing Confirmed by Ripley Fraise [phone]) on 12/19/2017 2:52:47 AM   Radiology Dg Chest 2 View  Result Date: 12/19/2017 CLINICAL DATA:  Shortness of breath. EXAM: CHEST - 2 VIEW COMPARISON:  Chest radiograph 12/07/2017 FINDINGS: Right-sided dialysis catheter tip in the atrial caval junction. Unchanged cardiomegaly. New pulmonary edema from prior exam. No pleural fluid. Improved left basilar aeration from prior exam with decreased atelectasis. No focal airspace disease. No pneumothorax. No acute osseous  abnormalities. IMPRESSION: New pulmonary edema from prior exam.  Chronic cardiomegaly. Electronically Signed   By: Jeb Levering M.D.   On: 12/19/2017 00:33    Procedures Procedures  Medications Ordered in ED Medications  HYDROmorphone (DILAUDID) injection 0.5 mg (0.5 mg Intravenous Given 12/19/17 0412)     Initial Impression / Assessment and Plan / ED Course  I have reviewed the triage vital signs and the nursing notes.  Pertinent labs  results that were available during my care of the patient were reviewed by me and considered in my medical decision making (see chart for details).     4:00 AM Patient reports that he is having increasing fluid in his abdomen that he feels is causing his pain.  Previous history includes recent admission for SBP.  He was supposed to receive IV antibiotics with dialysis, and he should be taken Cipro daily for prophylaxis.  He admits to missing recent Cipro doses. Labs are near baseline here at this time.  No fevers or vomiting is reported 4:30 AM He reports the symptoms are  similar to previous times that he he has required paracentesis.  Plan will be to have him undergo an ultrasound-guided paracentesis by IR.  I have low suspicion for acute SBP at this time as he is afebrile no elevated white count.  He does need to go back on his daily Cipro prophylaxis. Chest x-ray was read out as pulmonary edema, but he is in no distress, no hypoxia and has been trying to eat crackers.  I do not feel emergent dialysis is required Phone for IR PA is [phone]  Suspicion for other acute abdominal emergency is low  7:25 AM Signed out to Dr. Lacinda Axon with paracentesis pending anticipate discharge after that Final Clinical Impressions(s) / ED Diagnoses   Final diagnoses:  Abdominal pain  Ascites due to alcoholic cirrhosis ([place])  ESRD (end stage renal disease) [facility])    ED Discharge Orders    None       Ripley Fraise, MD 12/19/17 [phone]

## 2017-12-19 NOTE — ED Notes (Signed)
Pt refuses to get in gown or get hooked up.

## 2017-12-20 ENCOUNTER — Ambulatory Visit (INDEPENDENT_AMBULATORY_CARE_PROVIDER_SITE_OTHER): Payer: Medicare Other | Admitting: Physician Assistant

## 2017-12-20 ENCOUNTER — Other Ambulatory Visit: Payer: Medicare Other

## 2017-12-20 ENCOUNTER — Encounter: Payer: Self-pay | Admitting: Physician Assistant

## 2017-12-20 VITALS — BP 182/86 | HR 78 | Ht 69.0 in | Wt 159.0 lb

## 2017-12-20 DIAGNOSIS — Z86010 Personal history of colonic polyps: Secondary | ICD-10-CM

## 2017-12-20 DIAGNOSIS — K7460 Unspecified cirrhosis of liver: Secondary | ICD-10-CM

## 2017-12-20 DIAGNOSIS — K7290 Hepatic failure, unspecified without coma: Secondary | ICD-10-CM | POA: Diagnosis not present

## 2017-12-20 DIAGNOSIS — K652 Spontaneous bacterial peritonitis: Secondary | ICD-10-CM | POA: Diagnosis not present

## 2017-12-20 DIAGNOSIS — Z1211 Encounter for screening for malignant neoplasm of colon: Secondary | ICD-10-CM

## 2017-12-20 DIAGNOSIS — K7030 Alcoholic cirrhosis of liver without ascites: Secondary | ICD-10-CM

## 2017-12-20 MED ORDER — LACTULOSE 10 GM/15ML PO SOLN: ORAL | 3 refills | Status: DC

## 2017-12-20 MED ORDER — CIPROFLOXACIN HCL 500 MG PO TABS: ORAL_TABLET | ORAL | 11 refills | Status: DC

## 2017-12-20 NOTE — Patient Instructions (Signed)
Your provider has requested that you go to the basement level for lab work before leaving today. Press "B" on the elevator. The lab is located at the first door on the left as you exit the elevator. Continue Cipro 500 mg by mouth daily . Refills sent to pharmacy. Continue Lactulose 15 ml twice daily.  You have been scheduled for an endoscopy and colonoscopy. Please follow the written instructions given to you at your visit today.  We have given you the prep for the colonoscopy.   If you use inhalers (even only as needed), please bring them with you on the day of your procedure.

## 2017-12-20 NOTE — Progress Notes (Addendum)
Subjective:    Patient ID: [name], male    DOB: 21-Nov-1960, 57 y.o.   MRN: [id number]  HPI [name] is a 57 year old African-American male, known to Dr. Hilarie Fredrickson who comes in today for follow-up of decompensated cirrhosis secondary to EtOH and hep C, which was complicated by recurrent SBP with recent hospitalization May 2019.  He was seen in consultation by Dr. Lyndel Safe at that time.  He had undergone a couple of recent paracenteses, the last on 12/06/2017 during an ER visit.  He had 4.7 L removed at that time, and cell counts were improved with rare WBCs, cell count showed 727 WBCs and 58 neutrophils, significantly improved over counts at the time of hospitalization. He had initially been treated with Vanco and ceftaz x10 days and is now on Cipro 500 mg p.o. Daily.  Patient also has other significant comorbidities with end-stage renal disease on dialysis Monday Wednesday and Friday, history of hepatic encephalopathy, hypertension, COPD, atrial fib flutter, prior history of substance abuse/cocaine and personality disorder. Has history of multiple adenomatous colon polyps with last colonoscopy done in March 2014. Last EGD in May 2018 showed no evidence of esophageal varices or portal gastropathy. Had sigmoidoscopy in January 2019 for rectal bleeding and was found to have multiple rectal ulcers consistent with solitary rectal ulcer syndrome. Patient actually had an ER visit 2 days ago with complaints of abdominal pain.  He says that he thought the fluid was back in his abdomen but now thinks it may have been related to his nerves.  He says his nerves are bad and he cannot get anyone to give him any medication for his nerves.  He did have abdominal ultrasound done at that time which did not show any significant ascites. Patient currently has no peripheral edema.  He says his appetite has been fairly good.  No complaints of nausea or vomiting.  No fever. We reviewed his medications and best I can  tell he has been taking the Cipro on a daily basis and has been taking lactulose.  Is not currently on any diuretics, and no anticoagulation.  Interview today was somewhat difficult as patient went a lot of time on the phone apparently with the [place] during his office visit.  Review of Systems Pertinent positive and negative review of systems were noted in the above HPI section.  All other review of systems was otherwise negative.  Outpatient Encounter Medications as of 12/20/2017  Medication Sig  . albuterol (VENTOLIN HFA) 108 (90 Base) MCG/ACT inhaler Inhale 2 puffs into the lungs every 6 (six) hours as needed for wheezing or shortness of breath.  . budesonide-formoterol (SYMBICORT) 80-4.5 MCG/ACT inhaler Inhale 2 puffs into the lungs 2 (two) times daily.  . Ceftazidime (FORTAZ) 2 g SOLR injection Inject 2 g into the vein every other day. For 10days  . ciprofloxacin (CIPRO) 500 MG tablet Take 1 tablet by mouth daily.  Marland Kitchen diltiazem (CARDIZEM CD) 120 MG 24 hr capsule Take 1 capsule (120 mg total) by mouth daily.  . hydrALAZINE (APRESOLINE) 100 MG tablet Take 1 tablet by mouth twice a day take after HD on dialysis days  . isosorbide mononitrate (IMDUR) 30 MG 24 hr tablet Take 1 tablet (30 mg total) by mouth daily. (Patient taking differently: Take 30 mg by mouth daily. ER)  . lactulose (CHRONULAC) 10 GM/15ML solution Take twice a day on NON-dialysis days and once a day on dialysis days  . Nutritional Supplements (FEEDING SUPPLEMENT, NEPRO CARB STEADY,)  LIQD Take 237 mLs by mouth 2 (two) times daily between meals.  . Oxycodone HCl 10 MG TABS Take 1 tablet (10 mg total) by mouth every 6 (six) hours as needed. (Patient taking differently: Take 10 mg by mouth every 6 (six) hours as needed (for pain). )  . sevelamer carbonate (RENVELA) 800 MG tablet Take 1,600-3,200 mg by mouth See admin instructions. Take 4 tablets (800mg  each) three times daily and 2 tablets twice daily with a snack.  Marland Kitchen  tiotropium (SPIRIVA) 18 MCG inhalation capsule Place 18 mcg into inhaler and inhale daily.  . Vancomycin (VANCOCIN) 750-5 MG/150ML-% SOLN Inject 150 mLs (750 mg total) into the vein every Monday, Wednesday, and Friday with hemodialysis. For 10days  . [DISCONTINUED] ALPRAZolam (XANAX) 0.25 MG tablet Take 1 tablet (0.25 mg total) by mouth 2 (two) times daily as needed for anxiety.  . [DISCONTINUED] ciprofloxacin (CIPRO) 500 MG tablet Take 1 tablet (500 mg total) by mouth at bedtime. Once completed IV antibiotics with dialysis, then start ciprofloxacin for prophylaxis 500mg  qhs  . [DISCONTINUED] clopidogrel (PLAVIX) 75 MG tablet Take 1 tablet (75 mg total) by mouth daily.  . [DISCONTINUED] famotidine (PEPCID) 20 MG tablet One at bedtime (Patient taking differently: Take 20 mg by mouth at bedtime. )  . [DISCONTINUED] fluticasone (FLONASE) 50 MCG/ACT nasal spray Place 2 sprays into both nostrils daily.  . [DISCONTINUED] lactulose (CHRONULAC) 10 GM/15ML solution Take twice a day on NON-dialysis days and once a day on dialysis days  . [DISCONTINUED] loratadine (CLARITIN) 10 MG tablet Take 1 tablet (10 mg total) by mouth daily.  . [DISCONTINUED] pantoprazole (PROTONIX) 40 MG tablet Take 1 tablet (40 mg total) by mouth daily. Take 30-60 min before first meal of the day  . [DISCONTINUED] predniSONE (DELTASONE) 10 MG tablet Take 10 mg by mouth 2 (two) times daily.   No facility-administered encounter medications on file as of 12/20/2017.    Allergies  Allergen Reactions  . Aspirin Other (See Comments)    STOMACH PAIN  . Clonidine Derivatives Itching  . Tramadol Itching  . Tylenol [Acetaminophen] Nausea Only    Stomach ache   Patient Active Problem List   Diagnosis Date Noted  . ESRD (end stage renal disease) ([place]) 11/21/2017  . GERD (gastroesophageal reflux disease) 11/16/2017  . Liver cirrhosis ([place]) 11/15/2017  . DNR (do not resuscitate)   . Palliative care by specialist   . Hyponatremia  11/04/2017  . SBP (spontaneous bacterial peritonitis) ([place]) 10/30/2017  . Liver disease, chronic 10/30/2017  . SOB (shortness of breath)   . Abdominal pain 10/28/2017  . Upper airway cough syndrome with flattening on f/v loop 10/13/17 c/w vcd 10/17/2017  . Elevated diaphragm 10/13/2017  . Ileus ([place]) 09/29/2017  . Malnutrition of moderate degree 09/29/2017  . Sinus congestion 09/03/2017  . Symptomatic anemia 09/02/2017  . Other cirrhosis of liver ([place]) 09/02/2017  . Left bundle branch block 09/02/2017  . Mitral stenosis 09/02/2017  . Hematochezia 07/15/2017  . Wide-complex tachycardia ([place])   . Endotracheally intubated   . ESRD on dialysis ([place]) 07/04/2017  . Acute respiratory failure with hypoxia ([place]) 06/18/2017  . CKD (chronic kidney disease) stage V requiring chronic dialysis ([place]) 06/18/2017  . History of Cocaine abuse ([place]) 06/18/2017  . Hypertension 06/18/2017  . Infection of AV graft for dialysis ([place]) 06/18/2017  . Anxiety 06/18/2017  . Anemia due to chronic kidney disease 06/18/2017  . Atrial flutter with rapid ventricular response ([place]) 06/18/2017  . Personality disorder ([place]) 06/13/2017  .  Cellulitis 06/12/2017  . Adjustment disorder with mixed anxiety and depressed mood 06/10/2017  . Suicidal ideation 06/10/2017  . Arm wound, left, sequela 06/10/2017  . Dyspnea on exertion 05/29/2017  . Tachycardia 05/29/2017  . Hyperkalemia 05/22/2017  . Acute metabolic encephalopathy   . Anemia 04/23/2017  . Ascites 04/23/2017  . COPD (chronic obstructive pulmonary disease) ([place]) 04/23/2017  . Acute on chronic respiratory failure with hypoxia ([place]) 03/25/2017  . Arrhythmia 03/25/2017  . COPD GOLD 0 with flattening on inps f/v  09/27/2016  . Essential hypertension 09/27/2016  . Fluid overload 08/30/2016  . COPD exacerbation ([place]) 08/17/2016  . Hypertensive urgency 08/17/2016  . Respiratory failure ([place]) 08/17/2016  . Problem with dialysis access ([place]) 07/23/2016  . Chronic  hepatitis B ([place]) 03/05/2014  . Chronic hepatitis C without hepatic coma ([place]) 03/05/2014  . Internal hemorrhoids with bleeding, swelling and itching 03/05/2014  . Thrombocytopenia ([place]) 03/05/2014  . Chest pain 02/27/2014  . Alcohol abuse 04/14/2009  . Cigarette smoker 04/14/2009  . GANGLION CYST 04/14/2009   Social History   Socioeconomic History  . Marital status: Single    Spouse name: Not on file  . Number of children: 3  . Years of education: 10  . Highest education level: Not on file  Occupational History  . Occupation: Unemployed  Social Needs  . Financial resource strain: Not on file  . Food insecurity:    Worry: Not on file    Inability: Not on file  . Transportation needs:    Medical: Not on file    Non-medical: Not on file  Tobacco Use  . Smoking status: Current Every Day Smoker    Packs/day: 0.50    Years: 43.00    Pack years: 21.50    Types: Cigarettes    Start date: 08/13/1973  . Smokeless tobacco: Never Used  Substance and Sexual Activity  . Alcohol use: Not Currently    Frequency: Never    Comment: quit drinking in 2017  . Drug use: Not Currently    Comment: quit in 2017"  . Sexual activity: Not on file    Comment: "NOT LATELY" on cocaine  Lifestyle  . Physical activity:    Days per week: Not on file    Minutes per session: Not on file  . Stress: Not on file  Relationships  . Social connections:    Talks on phone: Not on file    Gets together: Not on file    Attends religious service: Not on file    Active member of club or organization: Not on file    Attends meetings of clubs or organizations: Not on file    Relationship status: Not on file  . Intimate partner violence:    Fear of current or ex partner: Not on file    Emotionally abused: Not on file    Physically abused: Not on file    Forced sexual activity: Not on file  Other Topics Concern  . Not on file  Social History Narrative   Lives alone   Caffeine use: Coffee-rare   Soda-  daily      [facility] (03/10/17):   Originally from [facility]. Previously worked trimming trees. No pets currently. No bird or mold exposure.     Mr. Duffner family history includes COPD in his father and other; Esophageal cancer in his sister; Heart disease in his father and mother; Hypertension in his other; Lung cancer in his mother; Malignant hyperthermia in his father; Throat cancer in  his sister.      Objective:    Vitals:   12/20/17 1022  BP: (!) 182/86  Pulse: 78    Physical Exam; well-developed African-American male, in no acute distress.  Blood pressure 182/86, pulse 78, height 5 foot 9, weight 159, BMI 23.4.  HEENT ;nontraumatic normocephalic EOMI PERRLA, sclera are anicteric, Oropharynx clear, Cardiovascular; regular rate and rhythm with S1-S2, Pulmonary; decreased breath sounds bilaterally and few scattered wheezes.  Abdomen ;somewhat protuberant, no fluid wave, bowel sounds are present, nodular liver palpable in the right upper quadrant no focal tenderness.  Rectal ;exam not done, Extremities; no clubbing cyanosis or edema skin warm and dry, Neuro psych;, alert and oriented, grossly nonfocal mood and affect appropriate       Assessment & Plan:   #46 57 year old African-American male with decompensated cirrhosis complicated by history of hepatic encephalopathy, and recurrent SBP. Patient had recent hospitalization with SBP and comes in today for follow-up. He is currently on prophylaxis with Cipro 500 mg p.o. daily Upper abdominal ultrasound done 12/18/2017 showed no significant ascites  #2 end-stage renal disease on dialysis #3 COPD #4 history of atrial fib/flutter #5 prior history of substance abuse #6 personality disorder #7 history of multiple adenomatous and hyperplastic colon polyps due for follow-up colonoscopy #8 history of GI bleeding secondary to rectal ulcers January 2019  Plan; Emphasized to patient importance of continuing Cipro 500 mg p.o. daily  long-term.  Multiple refills were sent today  Continue lactulose 15 cc twice daily long-term, multiple refills sent Check alpha-fetoprotein level today, he has had multiple imaging studies over the past year for [facility] surveillance Patient will be scheduled for colonoscopy and EGD to assess for esophageal varices with Dr. Hilarie Fredrickson.  Procedures have been scheduled at [facility].  Indications risks and benefits were discussed in detail with the patient and he is agreeable to proceed.  Amy S Esterwood PA-C 12/20/2017   Cc: [name], MD   Addendum: Reviewed and agree with management. Pyrtle, Lajuan Lines, MD

## 2017-12-21 DIAGNOSIS — D509 Iron deficiency anemia, unspecified: Secondary | ICD-10-CM | POA: Diagnosis not present

## 2017-12-21 DIAGNOSIS — N186 End stage renal disease: Secondary | ICD-10-CM | POA: Diagnosis not present

## 2017-12-21 DIAGNOSIS — N2581 Secondary hyperparathyroidism of renal origin: Secondary | ICD-10-CM | POA: Diagnosis not present

## 2017-12-21 DIAGNOSIS — D631 Anemia in chronic kidney disease: Secondary | ICD-10-CM | POA: Diagnosis not present

## 2017-12-21 LAB — AFP TUMOR MARKER: AFP-Tumor Marker: 2.1 ng/mL (ref <6.1)

## 2017-12-22 DIAGNOSIS — I480 Paroxysmal atrial fibrillation: Secondary | ICD-10-CM | POA: Diagnosis not present

## 2017-12-22 DIAGNOSIS — D638 Anemia in other chronic diseases classified elsewhere: Secondary | ICD-10-CM | POA: Diagnosis not present

## 2017-12-22 DIAGNOSIS — Z720 Tobacco use: Secondary | ICD-10-CM | POA: Diagnosis not present

## 2017-12-22 DIAGNOSIS — J449 Chronic obstructive pulmonary disease, unspecified: Secondary | ICD-10-CM | POA: Diagnosis not present

## 2017-12-22 DIAGNOSIS — I2510 Atherosclerotic heart disease of native coronary artery without angina pectoris: Secondary | ICD-10-CM | POA: Diagnosis not present

## 2017-12-22 DIAGNOSIS — I10 Essential (primary) hypertension: Secondary | ICD-10-CM | POA: Diagnosis not present

## 2017-12-22 DIAGNOSIS — G4709 Other insomnia: Secondary | ICD-10-CM | POA: Diagnosis not present

## 2017-12-22 DIAGNOSIS — B1910 Unspecified viral hepatitis B without hepatic coma: Secondary | ICD-10-CM | POA: Diagnosis not present

## 2017-12-22 DIAGNOSIS — B182 Chronic viral hepatitis C: Secondary | ICD-10-CM | POA: Diagnosis not present

## 2017-12-22 DIAGNOSIS — N186 End stage renal disease: Secondary | ICD-10-CM | POA: Diagnosis not present

## 2017-12-23 ENCOUNTER — Emergency Department (HOSPITAL_COMMUNITY)
Admission: EM | Admit: 2017-12-23 | Discharge: 2017-12-24 | Disposition: A | Discharge status: Home / Self Care | Payer: Medicare Other | Attending: Emergency Medicine | Admitting: Emergency Medicine

## 2017-12-23 ENCOUNTER — Emergency Department (HOSPITAL_COMMUNITY): Payer: Medicare Other

## 2017-12-23 ENCOUNTER — Encounter (HOSPITAL_COMMUNITY): Payer: Self-pay | Admitting: Emergency Medicine

## 2017-12-23 DIAGNOSIS — N186 End stage renal disease: Secondary | ICD-10-CM | POA: Diagnosis not present

## 2017-12-23 DIAGNOSIS — Z79899 Other long term (current) drug therapy: Secondary | ICD-10-CM | POA: Insufficient documentation

## 2017-12-23 DIAGNOSIS — I120 Hypertensive chronic kidney disease with stage 5 chronic kidney disease or end stage renal disease: Secondary | ICD-10-CM | POA: Diagnosis not present

## 2017-12-23 DIAGNOSIS — R0789 Other chest pain: Secondary | ICD-10-CM | POA: Diagnosis not present

## 2017-12-23 DIAGNOSIS — I10 Essential (primary) hypertension: Secondary | ICD-10-CM | POA: Diagnosis not present

## 2017-12-23 DIAGNOSIS — N2581 Secondary hyperparathyroidism of renal origin: Secondary | ICD-10-CM | POA: Diagnosis not present

## 2017-12-23 DIAGNOSIS — N185 Chronic kidney disease, stage 5: Secondary | ICD-10-CM | POA: Insufficient documentation

## 2017-12-23 DIAGNOSIS — R079 Chest pain, unspecified: Secondary | ICD-10-CM | POA: Diagnosis not present

## 2017-12-23 DIAGNOSIS — D631 Anemia in chronic kidney disease: Secondary | ICD-10-CM | POA: Diagnosis not present

## 2017-12-23 DIAGNOSIS — J441 Chronic obstructive pulmonary disease with (acute) exacerbation: Secondary | ICD-10-CM | POA: Insufficient documentation

## 2017-12-23 DIAGNOSIS — R1084 Generalized abdominal pain: Secondary | ICD-10-CM | POA: Insufficient documentation

## 2017-12-23 DIAGNOSIS — F17210 Nicotine dependence, cigarettes, uncomplicated: Secondary | ICD-10-CM | POA: Diagnosis not present

## 2017-12-23 DIAGNOSIS — D509 Iron deficiency anemia, unspecified: Secondary | ICD-10-CM | POA: Diagnosis not present

## 2017-12-23 DIAGNOSIS — R0600 Dyspnea, unspecified: Secondary | ICD-10-CM | POA: Diagnosis not present

## 2017-12-23 LAB — CBC
HCT: 26.9 % — ABNORMAL LOW (ref 39.0–52.0)
Hemoglobin: 8.9 g/dL — ABNORMAL LOW (ref 13.0–17.0)
MCH: 25.1 pg — ABNORMAL LOW (ref 26.0–34.0)
MCHC: 33.1 g/dL (ref 30.0–36.0)
MCV: 75.8 fL — ABNORMAL LOW (ref 78.0–100.0)
Platelets: 114 10*3/uL — ABNORMAL LOW (ref 150–400)
RBC: 3.55 MIL/uL — ABNORMAL LOW (ref 4.22–5.81)
RDW: 21.4 % — ABNORMAL HIGH (ref 11.5–15.5)
WBC: 7.4 10*3/uL (ref 4.0–10.5)

## 2017-12-23 LAB — BASIC METABOLIC PANEL
Anion gap: 13 (ref 5–15)
BUN: 20 mg/dL (ref 6–20)
CO2: 32 mmol/L (ref 22–32)
Calcium: 8.2 mg/dL — ABNORMAL LOW (ref 8.9–10.3)
Chloride: 91 mmol/L — ABNORMAL LOW (ref 101–111)
Creatinine, Ser: 5.04 mg/dL — ABNORMAL HIGH (ref 0.61–1.24)
GFR calc Af Amer: 14 mL/min — ABNORMAL LOW (ref >60)
GFR calc non Af Amer: 12 mL/min — ABNORMAL LOW (ref >60)
Glucose, Bld: 112 mg/dL — ABNORMAL HIGH (ref 65–99)
Potassium: 3.7 mmol/L (ref 3.5–5.1)
Sodium: 136 mmol/L (ref 135–145)

## 2017-12-23 LAB — I-STAT TROPONIN, ED
Troponin i, poc: 0.05 ng/mL (ref 0.00–0.08)
Troponin i, poc: 0.06 ng/mL (ref 0.00–0.08)

## 2017-12-23 LAB — BRAIN NATRIURETIC PEPTIDE: B Natriuretic Peptide: 1982.7 pg/mL — ABNORMAL HIGH (ref 0.0–100.0)

## 2017-12-23 MED ORDER — DIPHENHYDRAMINE HCL 50 MG/ML IJ SOLN
12.5000 mg | Freq: Once | INTRAMUSCULAR | Status: AC
  Administered 2017-12-23: 12.5 mg via INTRAVENOUS
  Filled 2017-12-23: qty 1

## 2017-12-23 MED ORDER — HYDROMORPHONE HCL 2 MG/ML IJ SOLN
0.5000 mg | Freq: Once | INTRAMUSCULAR | Status: AC
  Administered 2017-12-23: 0.5 mg via INTRAVENOUS
  Filled 2017-12-23: qty 1

## 2017-12-23 NOTE — Discharge Instructions (Signed)
Please continue your antibiotic (Cipro) Please go to dialysis Monday. See if you can arrange to go sooner Return if worsening

## 2017-12-23 NOTE — ED Provider Notes (Signed)
[facility] EMERGENCY DEPARTMENT Provider Note   CSN: [id number] Arrival date & time: 12/23/17  1755     History   Chief Complaint Chief Complaint  Patient presents with  . Chest Pain    HPI [name] is a 57 y.o. male with extensive PMH presents with chest pain and abdominal pain. He states that the chest pain started tonight. It is like a pressure. It got better when he got to the ED. He reports mild SOB but this is not worse than normal. His main complaint now is abdominal pain. This is a chronic problem but worse than normal - he thinks because he's had abdominal swelling. He also has a lot of itching despite taking his binders. He also requests a paracentesis and states his abdominal swelling has been treated this way several times in the past. He went to dialysis today but left early because it was too cold. He denies fever or chills. He also has a headache. He has been taking antibiotics for possible SBP. He saw GI a couple days ago.  HPI  Past Medical History:  Diagnosis Date  . Anemia   . Anxiety   . Arthritis    left shoulder  . Atherosclerosis of aorta ([place])   . Cardiomegaly   . Chest pain    DATE UNKNOWN, C/O PERIODICALLY  . Cocaine abuse ([place])   . COPD exacerbation ([place]) 08/17/2016  . Coronary artery disease    stent 02/22/17  . ESRD (end stage renal disease) on dialysis ([place])    "[name]; MWF" (07/04/2017)  . GERD (gastroesophageal reflux disease)    DATE UNKNOWN  . Hemorrhoids   . Hepatitis B, chronic ([place])   . Hepatitis C   . History of kidney stones   . Hyperkalemia   . Hypertension   . Metabolic bone disease    Patient denies  . Mitral stenosis   . Myocardial infarction ([place])   . Pneumonia   . Pulmonary edema   . Solitary rectal ulcer syndrome 07/2017   at [facility] for rectal bleeding  . Tubular adenoma of colon     Patient Active Problem List   Diagnosis Date Noted  . ESRD (end stage renal disease) ([place])  11/21/2017  . GERD (gastroesophageal reflux disease) 11/16/2017  . Liver cirrhosis ([place]) 11/15/2017  . DNR (do not resuscitate)   . Palliative care by specialist   . Hyponatremia 11/04/2017  . SBP (spontaneous bacterial peritonitis) ([place]) 10/30/2017  . Liver disease, chronic 10/30/2017  . SOB (shortness of breath)   . Abdominal pain 10/28/2017  . Upper airway cough syndrome with flattening on f/v loop 10/13/17 c/w vcd 10/17/2017  . Elevated diaphragm 10/13/2017  . Ileus ([place]) 09/29/2017  . Malnutrition of moderate degree 09/29/2017  . Sinus congestion 09/03/2017  . Symptomatic anemia 09/02/2017  . Other cirrhosis of liver ([place]) 09/02/2017  . Left bundle branch block 09/02/2017  . Mitral stenosis 09/02/2017  . Hematochezia 07/15/2017  . Wide-complex tachycardia ([place])   . Endotracheally intubated   . ESRD on dialysis ([place]) 07/04/2017  . Acute respiratory failure with hypoxia ([place]) 06/18/2017  . CKD (chronic kidney disease) stage V requiring chronic dialysis ([place]) 06/18/2017  . History of Cocaine abuse ([place]) 06/18/2017  . Hypertension 06/18/2017  . Infection of AV graft for dialysis ([place]) 06/18/2017  . Anxiety 06/18/2017  . Anemia due to chronic kidney disease 06/18/2017  . Atrial flutter with rapid ventricular response ([place]) 06/18/2017  . Personality disorder (  [place]) 06/13/2017  . Cellulitis 06/12/2017  . Adjustment disorder with mixed anxiety and depressed mood 06/10/2017  . Suicidal ideation 06/10/2017  . Arm wound, left, sequela 06/10/2017  . Dyspnea on exertion 05/29/2017  . Tachycardia 05/29/2017  . Hyperkalemia 05/22/2017  . Acute metabolic encephalopathy   . Anemia 04/23/2017  . Ascites 04/23/2017  . COPD (chronic obstructive pulmonary disease) ([place]) 04/23/2017  . Acute on chronic respiratory failure with hypoxia ([place]) 03/25/2017  . Arrhythmia 03/25/2017  . COPD GOLD 0 with flattening on inps f/v  09/27/2016  . Essential hypertension 09/27/2016  . Fluid overload  08/30/2016  . COPD exacerbation ([place]) 08/17/2016  . Hypertensive urgency 08/17/2016  . Respiratory failure ([place]) 08/17/2016  . Problem with dialysis access ([place]) 07/23/2016  . Chronic hepatitis B ([place]) 03/05/2014  . Chronic hepatitis C without hepatic coma ([place]) 03/05/2014  . Internal hemorrhoids with bleeding, swelling and itching 03/05/2014  . Thrombocytopenia ([place]) 03/05/2014  . Chest pain 02/27/2014  . Alcohol abuse 04/14/2009  . Cigarette smoker 04/14/2009  . GANGLION CYST 04/14/2009    Past Surgical History:  Procedure Laterality Date  . A/V FISTULAGRAM Left 05/26/2017   Procedure: A/V FISTULAGRAM;  Surgeon: Conrad Shepherdstown, MD;  Location: [facility];  Service: Cardiovascular;  Laterality: Left;  . A/V FISTULAGRAM Right 11/18/2017   Procedure: A/V FISTULAGRAM - Right Arm;  Surgeon: Elam Dutch, MD;  Location: [facility];  Service: Cardiovascular;  Laterality: Right;  . APPLICATION OF WOUND VAC Left 06/14/2017   Procedure: APPLICATION OF WOUND VAC;  Surgeon: Katha Cabal, MD;  Location: [facility];  Service: Vascular;  Laterality: Left;  . AV FISTULA PLACEMENT  2012   BELIEVED WAS PLACED IN JUNE  . AV FISTULA PLACEMENT Right 08/09/2017   Procedure: Creation Right arm ARTERIOVENOUS BRACHIOCEPOHALIC FISTULA;  Surgeon: Elam Dutch, MD;  Location: [facility];  Service: Vascular;  Laterality: Right;  . AV FISTULA PLACEMENT Right 11/22/2017   Procedure: INSERTION OF ARTERIOVENOUS (AV) GORE-TEX GRAFT RIGHT UPPER ARM;  Surgeon: Elam Dutch, MD;  Location: [facility];  Service: Vascular;  Laterality: Right;  . COLONOSCOPY    . CORONARY STENT INTERVENTION N/A 02/22/2017   Procedure: CORONARY STENT INTERVENTION;  Surgeon: Nigel Mormon, MD;  Location: [facility];  Service: Cardiovascular;  Laterality: N/A;  . FLEXIBLE SIGMOIDOSCOPY N/A 07/15/2017   Procedure: FLEXIBLE SIGMOIDOSCOPY;  Surgeon: Carol Ada, MD;  Location: [facility];  Service: Endoscopy;   Laterality: N/A;  . HEMORRHOID BANDING    . I&D EXTREMITY Left 06/01/2017   Procedure: IRRIGATION AND DEBRIDEMENT LEFT ARM HEMATOMA WITH LIGATION OF LEFT ARM AV FISTULA;  Surgeon: Elam Dutch, MD;  Location: [facility];  Service: Vascular;  Laterality: Left;  . I&D EXTREMITY Left 06/14/2017   Procedure: IRRIGATION AND DEBRIDEMENT EXTREMITY;  Surgeon: Katha Cabal, MD;  Location: [facility];  Service: Vascular;  Laterality: Left;  . INSERTION OF DIALYSIS CATHETER  05/30/2017  . INSERTION OF DIALYSIS CATHETER N/A 05/30/2017   Procedure: INSERTION OF DIALYSIS CATHETER;  Surgeon: Elam Dutch, MD;  Location: [facility];  Service: Vascular;  Laterality: N/A;  . IR PARACENTESIS  08/30/2017  . IR PARACENTESIS  09/29/2017  . IR PARACENTESIS  10/28/2017  . IR PARACENTESIS  11/09/2017  . IR PARACENTESIS  11/16/2017  . IR PARACENTESIS  11/28/2017  . IR PARACENTESIS  12/01/2017  . IR PARACENTESIS  12/06/2017  . LEFT HEART CATH AND CORONARY ANGIOGRAPHY N/A 02/22/2017   Procedure:  LEFT HEART CATH AND CORONARY ANGIOGRAPHY;  Surgeon: Nigel Mormon, MD;  Location: [facility];  Service: Cardiovascular;  Laterality: N/A;  . LIGATION OF ARTERIOVENOUS  FISTULA Left 01/14/2830   Procedure: Plication of Left Arm Arteriovenous Fistula;  Surgeon: Elam Dutch, MD;  Location: [facility];  Service: Vascular;  Laterality: Left;  . POLYPECTOMY    . REVISON OF ARTERIOVENOUS FISTULA Left 11/26/6158   Procedure: PLICATION OF DISTAL ANEURYSMAL SEGEMENT OF LEFT UPPER ARM ARTERIOVENOUS FISTULA;  Surgeon: Elam Dutch, MD;  Location: [facility];  Service: Vascular;  Laterality: Left;  . REVISON OF ARTERIOVENOUS FISTULA Left 7/37/1062   Procedure: Plication of Left Upper Arm Fistula ;  Surgeon: Waynetta Sandy, MD;  Location: [facility];  Service: Vascular;  Laterality: Left;  . SKIN GRAFT SPLIT THICKNESS LEG / FOOT Left    SKIN GRAFT SPLIT THICKNESS LEFT ARM DONOR SITE: LEFT ANTERIOR THIGH  . SKIN SPLIT GRAFT Left  07/04/2017   Procedure: SKIN GRAFT SPLIT THICKNESS LEFT ARM DONOR SITE: LEFT ANTERIOR THIGH;  Surgeon: Elam Dutch, MD;  Location: [facility];  Service: Vascular;  Laterality: Left;  . THROMBECTOMY W/ EMBOLECTOMY Left 06/05/2017   Procedure: EXPLORATION OF LEFT ARM FOR BLEEDING; OVERSEWED PROXIMAL FISTULA;  Surgeon: Angelia Mould, MD;  Location: [facility];  Service: Vascular;  Laterality: Left;  . WOUND EXPLORATION Left 06/03/2017   Procedure: WOUND EXPLORATION WITH WOUND VAC APPLICATION TO LEFT ARM;  Surgeon: Angelia Mould, MD;  Location: [facility];  Service: Vascular;  Laterality: Left;        Home Medications    Prior to Admission medications   Medication Sig Start Date End Date Taking? Authorizing Provider  albuterol (VENTOLIN HFA) 108 (90 Base) MCG/ACT inhaler Inhale 2 puffs into the lungs every 6 (six) hours as needed for wheezing or shortness of breath.   Yes [provider]  budesonide-formoterol (SYMBICORT) 160-4.5 MCG/ACT inhaler Inhale 2 puffs into the lungs 2 (two) times daily.   Yes [provider]  clopidogrel (PLAVIX) 75 MG tablet Take 75 mg by mouth daily.   Yes [provider]  diltiazem (CARDIZEM CD) 120 MG 24 hr capsule Take 1 capsule (120 mg total) by mouth daily. 06/23/17 06/23/18 Yes Shahmehdi, Seyed A, MD  famotidine (PEPCID) 20 MG tablet Take 20 mg by mouth daily.   Yes [provider]  Fluticasone-Salmeterol (ADVAIR) 250-50 MCG/DOSE AEPB Inhale 1 puff into the lungs 2 (two) times daily.   Yes [provider]  gabapentin (NEURONTIN) 100 MG capsule Take 100 mg by mouth 2 (two) times daily. 12/13/17  Yes [provider]  hydrALAZINE (APRESOLINE) 100 MG tablet Take 1 tablet by mouth twice a day take after HD on dialysis days 08/31/17  Yes [provider]  isosorbide mononitrate (IMDUR) 30 MG 24 hr tablet Take 1 tablet (30 mg total) by mouth daily. 06/17/17  Yes [name], MD  lactulose, encephalopathy,  (CHRONULAC) 10 GM/15ML SOLN Take 20 g by mouth 2 (two) times daily.   Yes [provider]  oxyCODONE (OXY IR/ROXICODONE) 5 MG immediate release tablet Take 5 mg by mouth 2 (two) times daily as needed for pain. 12/13/17  Yes [provider]  pantoprazole (PROTONIX) 40 MG tablet Take 40 mg by mouth daily.   Yes [provider]  tiotropium (SPIRIVA) 18 MCG inhalation capsule Place 18 mcg into inhaler and inhale daily.   Yes [provider]  traZODone (DESYREL) 150 MG tablet Take 150  mg by mouth at bedtime.   Yes [provider]  budesonide-formoterol (SYMBICORT) 80-4.5 MCG/ACT inhaler Inhale 2 puffs into the lungs 2 (two) times daily. Patient not taking: Reported on 12/23/2017 10/13/17   Tanda Rockers, MD  Ceftazidime (FORTAZ) 2 g SOLR injection Inject 2 g into the vein every other day. For 10days Patient not taking: Reported on 12/23/2017 12/01/17   Rai, Vernelle Emerald, MD  ciprofloxacin (CIPRO) 500 MG tablet Take 1 tablet by mouth daily. Patient not taking: Reported on 12/23/2017 12/20/17   Alfredia Ferguson, PA-C  lactulose [facility]) 10 GM/15ML solution Take twice a day on NON-dialysis days and once a day on dialysis days Patient not taking: Reported on 12/23/2017 12/20/17   Esterwood, Amy S, PA-C  Nutritional Supplements (FEEDING SUPPLEMENT, NEPRO CARB STEADY,) LIQD Take 237 mLs by mouth 2 (two) times daily between meals. Patient not taking: Reported on 12/23/2017 09/30/17   Mariel Aloe, MD  Vancomycin [facility]) 750-5 MG/150ML-% SOLN Inject 150 mLs (750 mg total) into the vein every Monday, Wednesday, and Friday with hemodialysis. For 10days Patient not taking: Reported on 12/23/2017 12/02/17   Mendel Corning, MD    Family History Family History  Problem Relation Age of Onset  . Heart disease Mother   . Lung cancer Mother   . Heart disease Father   . Malignant hyperthermia Father   . COPD Father   . Throat cancer Sister   . Esophageal cancer Sister   .  Hypertension Other   . COPD Other   . Colon cancer Neg Hx   . Colon polyps Neg Hx   . Rectal cancer Neg Hx   . Stomach cancer Neg Hx     Social History Social History   Tobacco Use  . Smoking status: Current Every Day Smoker    Packs/day: 0.50    Years: 43.00    Pack years: 21.50    Types: Cigarettes    Start date: 08/13/1973  . Smokeless tobacco: Never Used  Substance Use Topics  . Alcohol use: Not Currently    Frequency: Never    Comment: quit drinking in 2017  . Drug use: Not Currently    Comment: quit in 2017"     Allergies   Morphine and related; Aspirin; Clonidine derivatives; Tramadol; and Tylenol [acetaminophen]   Review of Systems Review of Systems  Constitutional: Negative for chills and fever.  Respiratory: Negative for shortness of breath.   Cardiovascular: Positive for chest pain. Negative for leg swelling.  Gastrointestinal: Positive for abdominal distention, abdominal pain and nausea. Negative for vomiting.  Genitourinary:       +anuric  Skin:       +itching  Neurological: Positive for headaches.  All other systems reviewed and are negative.    Physical Exam Updated Vital Signs BP (!) 185/85   Pulse 79   Temp 97.9 F (36.6 C) (Oral)   Resp (!) 21   SpO2 100%   Physical Exam  Constitutional: He is oriented to person, place, and time. He appears well-developed and well-nourished. No distress.  Chronically ill appearing, NAD  HENT:  Head: Normocephalic and atraumatic.  Eyes: Pupils are equal, round, and reactive to light. Conjunctivae are normal. Right eye exhibits no discharge. Left eye exhibits no discharge. No scleral icterus.  Neck: Normal range of motion.  Cardiovascular: Normal rate and regular rhythm. Exam reveals no gallop and no friction rub.  No murmur heard. Pulmonary/Chest: Effort normal. No respiratory distress.  Rales in bilateral  lower lobes  Abdominal: Soft. Bowel sounds are normal. He exhibits distension (mild-moderate  distension). There is tenderness (generalized).  Musculoskeletal:  No lower leg edema  Neurological: He is alert and oriented to person, place, and time.  Skin: Skin is warm and dry.  Psychiatric: He has a normal mood and affect. His behavior is normal.  Nursing note and vitals reviewed.    ED Treatments / Results  Labs (all labs ordered are listed, but only abnormal results are displayed) Labs Reviewed  BASIC METABOLIC PANEL - Abnormal; Notable for the following components:      Result Value   Chloride 91 (*)    Glucose, Bld 112 (*)    Creatinine, Ser 5.04 (*)    Calcium 8.2 (*)    GFR calc non Af Amer 12 (*)    GFR calc Af Amer 14 (*)    All other components within normal limits  CBC - Abnormal; Notable for the following components:   RBC 3.55 (*)    Hemoglobin 8.9 (*)    HCT 26.9 (*)    MCV 75.8 (*)    MCH 25.1 (*)    RDW 21.4 (*)    Platelets 114 (*)    All other components within normal limits  BRAIN NATRIURETIC PEPTIDE - Abnormal; Notable for the following components:   B Natriuretic Peptide 1,982.7 (*)    All other components within normal limits  I-STAT TROPONIN, ED  I-STAT TROPONIN, ED    EKG EKG Interpretation  Date/Time:  Friday December 23 2017 21:13:45 EDT Ventricular Rate:  74 PR Interval:    QRS Duration: 172 QT Interval:  513 QTC Calculation: 570 R Axis:   -60 Text Interpretation:  Sinus rhythm Prolonged PR interval Left bundle branch block When compared with ECG of EARLIER SAME DATE Sinus rhythm has replaced Atrial fibrillation with rapid ventricular response Confirmed by Delora Fuel ([id number]) on 12/24/2017 12:02:35 AM   Radiology Dg Chest 2 View  Result Date: 12/23/2017 CLINICAL DATA:  Chest pain EXAM: CHEST - 2 VIEW COMPARISON:  Chest x-rays dated 12/19/2017 and 12/07/2017. FINDINGS: Stable cardiomegaly. Aortic atherosclerosis. Central pulmonary vascular congestion is stable compared to the most recent exam, with associated mild bilateral  interstitial edema. No confluent opacity to suggest a developing pneumonia. No pleural effusion or pneumothorax seen. No acute or suspicious osseous finding. Central catheter is stable in position with tip overlying the RIGHT atrium. IMPRESSION: 1. Stable cardiomegaly with central pulmonary vascular congestion and mild bilateral interstitial edema, consistent with persistent mild CHF. 2. No evidence of pneumonia. 3. Aortic atherosclerosis. Electronically Signed   By: Franki Cabot M.D.   On: 12/23/2017 19:10    Procedures Procedures (including critical care time)  Medications Ordered in ED Medications  HYDROmorphone (DILAUDID) injection 0.5 mg (0.5 mg Intravenous Given 12/23/17 2351)  diphenhydrAMINE (BENADRYL) injection 12.5 mg (12.5 mg Intravenous Given 12/23/17 2351)     Initial Impression / Assessment and Plan / ED Course  I have reviewed the triage vital signs and the nursing notes.  Pertinent labs & imaging results that were available during my care of the patient were reviewed by me and considered in my medical decision making (see chart for details).  57 year old male presents with chest pressure and generalized abdominal pain and distension. This is a chronic problem and he's had multiple ED visits for the same. He is markedly hypertensive - most likely from his non-compliance and not completing dialysis. On exam heart is regular rate and rhythm.  Lung exam reveals rales. He is not in respiratory distress. Abdomen is soft, mildly swollen, and generally tender. His EKG is SR with LBBB and prolonged PR interval. CXR shows mild bilateral interstitial edema which is chronic for him. CBC is remarkable for anemia which is around his baseline. He has no leukocytosis. BMP is unchanged from baseline. 1st and 2nd trop are normal. BNP is elevated from baseline but again he has missed his dialysis. Results were discussed. Do not see the need for emergent dialysis tonight or urgent paracentesis. He was  given pain medicine and IV benadryl and advised to return if worsening.   Final Clinical Impressions(s) / ED Diagnoses   Final diagnoses:  Nonspecific chest pain  Generalized abdominal pain    ED Discharge Orders    None       Recardo Evangelist, PA-C 12/24/17 0046    Fredia Sorrow, MD 12/25/17 [phone]

## 2017-12-23 NOTE — ED Notes (Signed)
Patient has removed BP cuff again.

## 2017-12-23 NOTE — ED Notes (Signed)
Patient states that he came off of dialysis machine 1 hour early because it was too cold. Also states that he is itching, may need paracentesis, and has removed his armband and restricted extremity band.

## 2017-12-24 IMAGING — CR DG CHEST 2V
2 series · 2 of 2 positions shown · non-contrast
Comparison: PA and lateral chest x-ray May 29, 2016

CLINICAL DATA: Illness of breath, weakness post dialysis yesterday.
History of hepatitis-B, current smoker.

EXAM:
CHEST  2 VIEW

[chest lat]
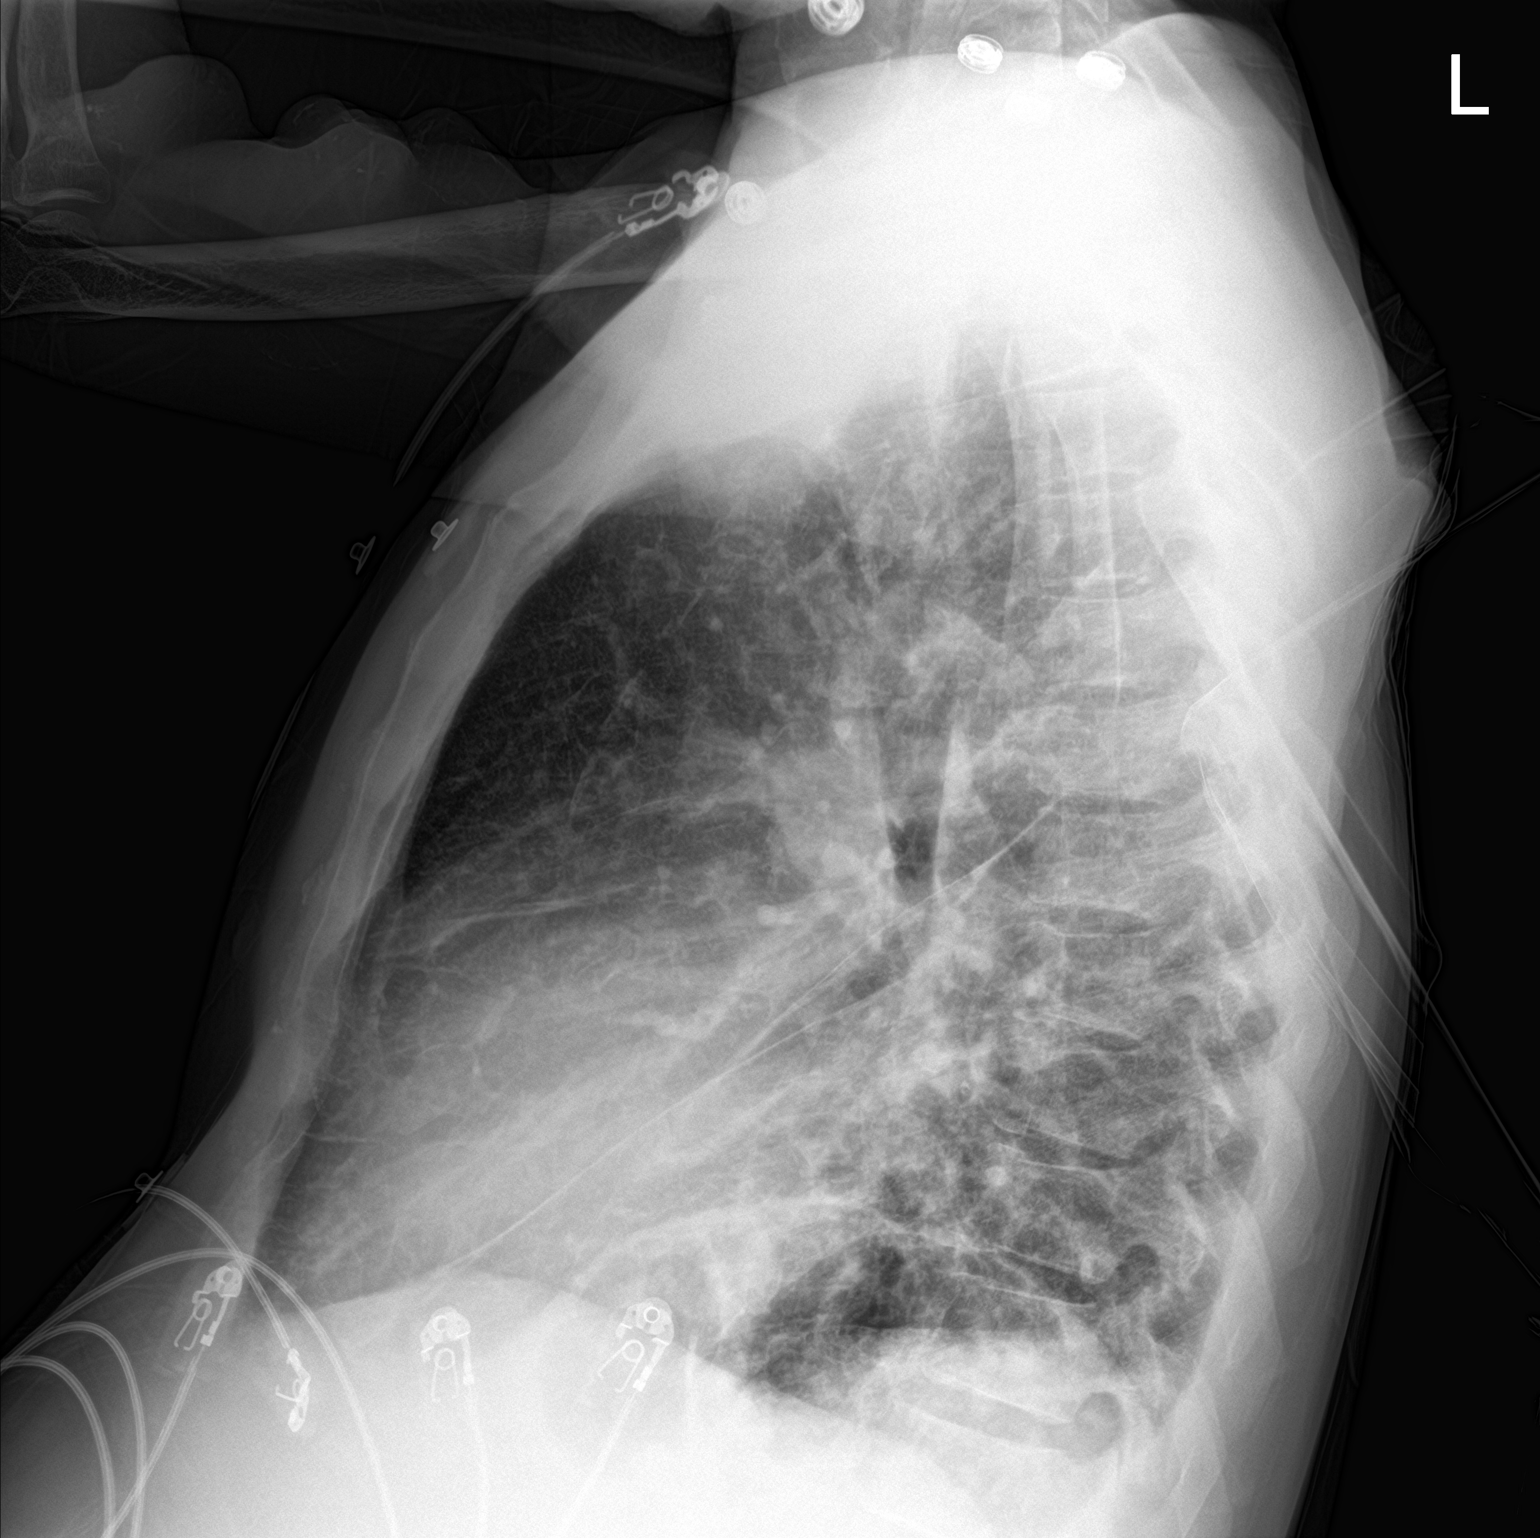

[chest ap]
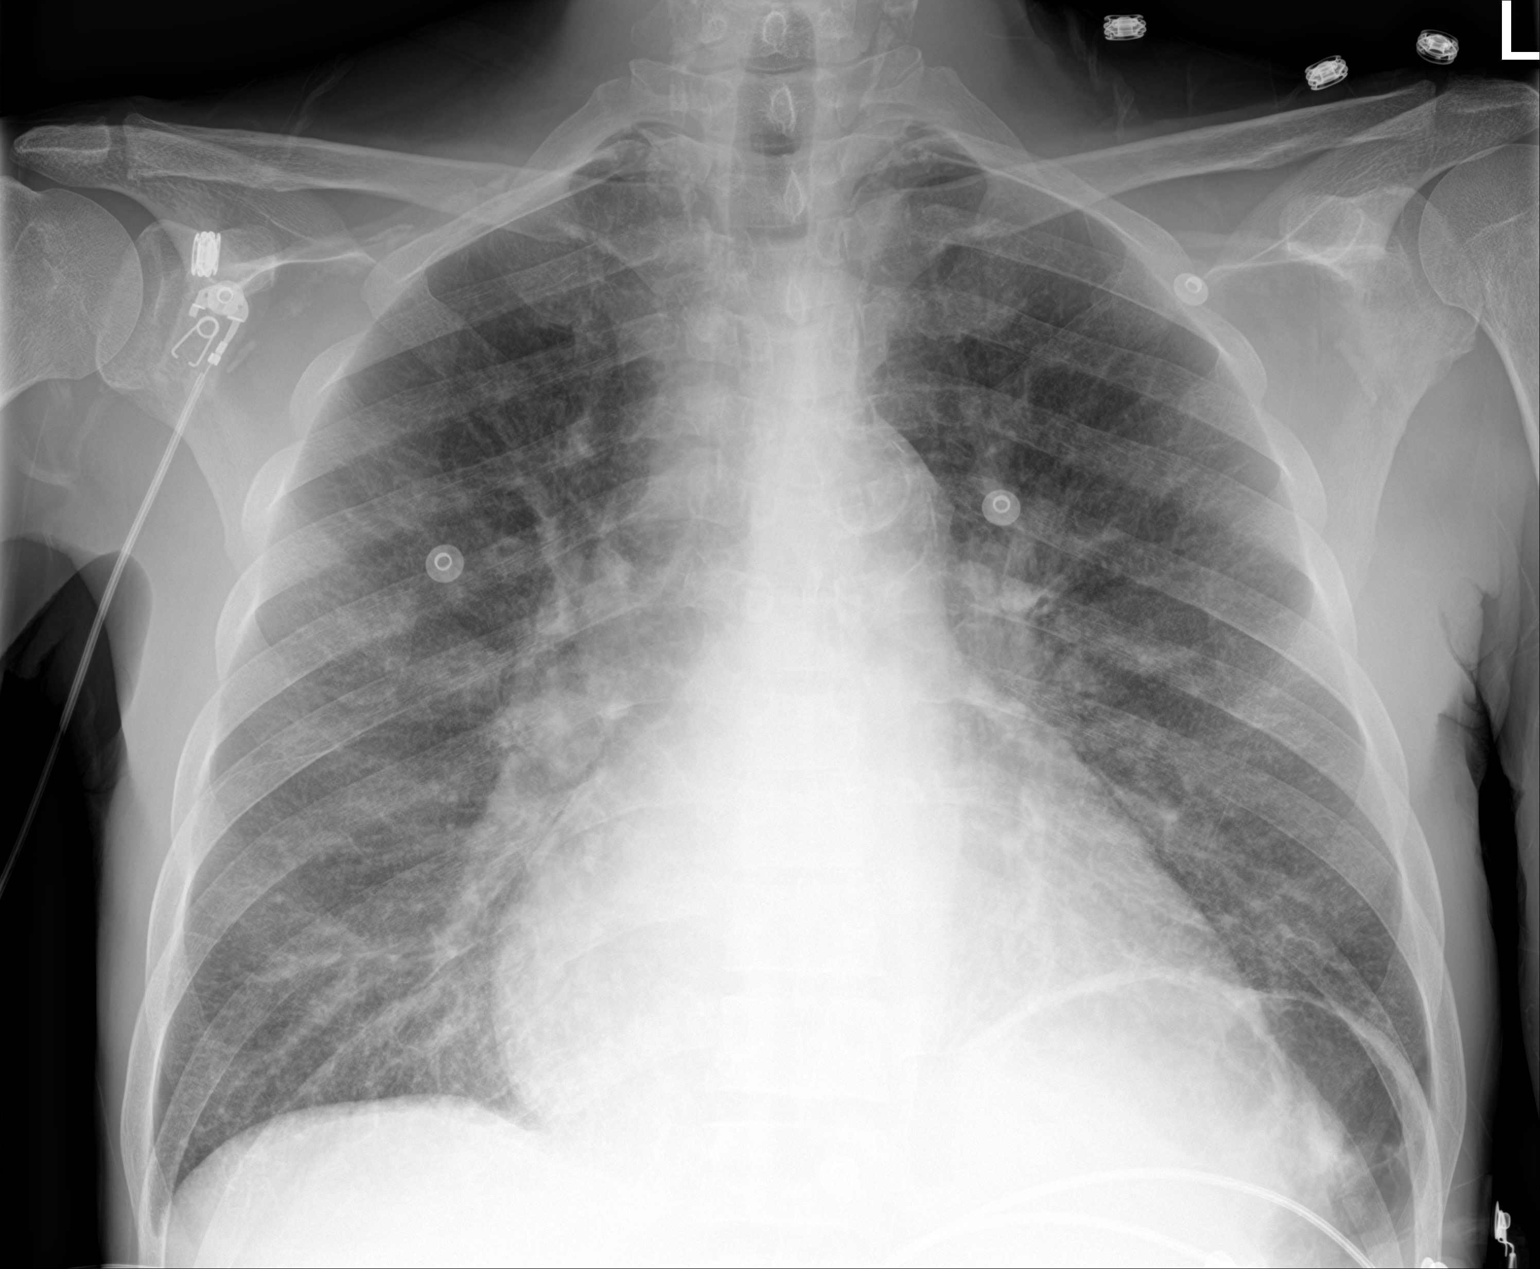

[2 of 2 positions shown; findings below may reference images not displayed]

FINDINGS: The lungs are well-expanded. The interstitial markings are
increased. The cardiac silhouette is enlarged and the pulmonary
vascularity is engorged. There is calcification in the wall of the
aortic arch. There is no pleural effusion. The mediastinum is normal
in width. The bony thorax exhibits no acute abnormality.
IMPRESSION: CHF with mild interstitial edema superimposed upon chronic
bronchitic changes. No alveolar pneumonia. The appearance of the
chest has not significantly changed since that May 29, 2016.

Thoracic aortic atherosclerosis.

## 2017-12-26 DIAGNOSIS — N2581 Secondary hyperparathyroidism of renal origin: Secondary | ICD-10-CM | POA: Diagnosis not present

## 2017-12-26 DIAGNOSIS — N186 End stage renal disease: Secondary | ICD-10-CM | POA: Diagnosis not present

## 2017-12-26 DIAGNOSIS — D509 Iron deficiency anemia, unspecified: Secondary | ICD-10-CM | POA: Diagnosis not present

## 2017-12-26 DIAGNOSIS — D631 Anemia in chronic kidney disease: Secondary | ICD-10-CM | POA: Diagnosis not present

## 2017-12-28 DIAGNOSIS — D509 Iron deficiency anemia, unspecified: Secondary | ICD-10-CM | POA: Diagnosis not present

## 2017-12-28 DIAGNOSIS — N2581 Secondary hyperparathyroidism of renal origin: Secondary | ICD-10-CM | POA: Diagnosis not present

## 2017-12-28 DIAGNOSIS — D631 Anemia in chronic kidney disease: Secondary | ICD-10-CM | POA: Diagnosis not present

## 2017-12-28 DIAGNOSIS — N186 End stage renal disease: Secondary | ICD-10-CM | POA: Diagnosis not present

## 2017-12-29 ENCOUNTER — Emergency Department (HOSPITAL_COMMUNITY): Payer: Medicare Other

## 2017-12-29 ENCOUNTER — Encounter (HOSPITAL_COMMUNITY): Payer: Self-pay | Admitting: *Deleted

## 2017-12-29 ENCOUNTER — Emergency Department (HOSPITAL_COMMUNITY)
Admission: EM | Admit: 2017-12-29 | Discharge: 2017-12-30 | Disposition: A | Discharge status: Home / Self Care | Payer: Medicare Other | Attending: Emergency Medicine | Admitting: Emergency Medicine

## 2017-12-29 DIAGNOSIS — R0602 Shortness of breath: Secondary | ICD-10-CM | POA: Diagnosis not present

## 2017-12-29 DIAGNOSIS — J449 Chronic obstructive pulmonary disease, unspecified: Secondary | ICD-10-CM | POA: Insufficient documentation

## 2017-12-29 DIAGNOSIS — I10 Essential (primary) hypertension: Secondary | ICD-10-CM | POA: Diagnosis not present

## 2017-12-29 DIAGNOSIS — Z992 Dependence on renal dialysis: Secondary | ICD-10-CM | POA: Diagnosis not present

## 2017-12-29 DIAGNOSIS — I120 Hypertensive chronic kidney disease with stage 5 chronic kidney disease or end stage renal disease: Secondary | ICD-10-CM | POA: Diagnosis not present

## 2017-12-29 DIAGNOSIS — R188 Other ascites: Secondary | ICD-10-CM | POA: Diagnosis not present

## 2017-12-29 DIAGNOSIS — I2510 Atherosclerotic heart disease of native coronary artery without angina pectoris: Secondary | ICD-10-CM | POA: Diagnosis not present

## 2017-12-29 DIAGNOSIS — R1084 Generalized abdominal pain: Secondary | ICD-10-CM | POA: Insufficient documentation

## 2017-12-29 DIAGNOSIS — R109 Unspecified abdominal pain: Secondary | ICD-10-CM | POA: Diagnosis not present

## 2017-12-29 DIAGNOSIS — N186 End stage renal disease: Secondary | ICD-10-CM | POA: Diagnosis not present

## 2017-12-29 DIAGNOSIS — F17210 Nicotine dependence, cigarettes, uncomplicated: Secondary | ICD-10-CM | POA: Insufficient documentation

## 2017-12-29 DIAGNOSIS — R51 Headache: Secondary | ICD-10-CM | POA: Diagnosis not present

## 2017-12-29 DIAGNOSIS — Z7901 Long term (current) use of anticoagulants: Secondary | ICD-10-CM | POA: Diagnosis not present

## 2017-12-29 DIAGNOSIS — Z79899 Other long term (current) drug therapy: Secondary | ICD-10-CM | POA: Insufficient documentation

## 2017-12-29 DIAGNOSIS — J189 Pneumonia, unspecified organism: Secondary | ICD-10-CM | POA: Diagnosis not present

## 2017-12-29 LAB — COMPREHENSIVE METABOLIC PANEL
ALT: 13 U/L — ABNORMAL LOW (ref 17–63)
AST: 23 U/L (ref 15–41)
Albumin: 3.4 g/dL — ABNORMAL LOW (ref 3.5–5.0)
Alkaline Phosphatase: 204 U/L — ABNORMAL HIGH (ref 38–126)
Anion gap: 14 (ref 5–15)
BUN: 25 mg/dL — ABNORMAL HIGH (ref 6–20)
CO2: 28 mmol/L (ref 22–32)
Calcium: 8.7 mg/dL — ABNORMAL LOW (ref 8.9–10.3)
Chloride: 93 mmol/L — ABNORMAL LOW (ref 101–111)
Creatinine, Ser: 6.62 mg/dL — ABNORMAL HIGH (ref 0.61–1.24)
GFR calc Af Amer: 10 mL/min — ABNORMAL LOW (ref >60)
GFR calc non Af Amer: 8 mL/min — ABNORMAL LOW (ref >60)
Glucose, Bld: 141 mg/dL — ABNORMAL HIGH (ref 65–99)
Potassium: 4.1 mmol/L (ref 3.5–5.1)
Sodium: 135 mmol/L (ref 135–145)
Total Bilirubin: 0.6 mg/dL (ref 0.3–1.2)
Total Protein: 6.7 g/dL (ref 6.5–8.1)

## 2017-12-29 LAB — CBC
HCT: 29.5 % — ABNORMAL LOW (ref 39.0–52.0)
Hemoglobin: 9.5 g/dL — ABNORMAL LOW (ref 13.0–17.0)
MCH: 25.4 pg — ABNORMAL LOW (ref 26.0–34.0)
MCHC: 32.2 g/dL (ref 30.0–36.0)
MCV: 78.9 fL (ref 78.0–100.0)
Platelets: 130 10*3/uL — ABNORMAL LOW (ref 150–400)
RBC: 3.74 MIL/uL — ABNORMAL LOW (ref 4.22–5.81)
RDW: 21.2 % — ABNORMAL HIGH (ref 11.5–15.5)
WBC: 7.2 10*3/uL (ref 4.0–10.5)

## 2017-12-29 LAB — LIPASE, BLOOD: Lipase: 42 U/L (ref 11–51)

## 2017-12-29 MED ORDER — HYDROMORPHONE HCL 2 MG/ML IJ SOLN
1.0000 mg | Freq: Once | INTRAMUSCULAR | Status: AC
  Administered 2017-12-29: 1 mg via INTRAVENOUS
  Filled 2017-12-29: qty 1

## 2017-12-29 MED ORDER — DIPHENHYDRAMINE HCL 50 MG/ML IJ SOLN
12.5000 mg | Freq: Once | INTRAMUSCULAR | Status: AC
  Administered 2017-12-29: 12.5 mg via INTRAVENOUS
  Filled 2017-12-29: qty 1

## 2017-12-29 MED ORDER — DIPHENHYDRAMINE HCL 25 MG PO CAPS
25.0000 mg | ORAL_CAPSULE | Freq: Once | ORAL | Status: AC
  Administered 2017-12-30: 25 mg via ORAL
  Filled 2017-12-29: qty 1

## 2017-12-29 MED ORDER — HYDROMORPHONE HCL 2 MG/ML IJ SOLN
1.0000 mg | Freq: Once | INTRAMUSCULAR | Status: AC
Start: 2017-12-29 — End: 2017-12-29
  Administered 2017-12-29: 1 mg via INTRAVENOUS
  Filled 2017-12-29: qty 1

## 2017-12-29 MED ORDER — OXYCODONE HCL 5 MG PO TABS
5.0000 mg | ORAL_TABLET | Freq: Once | ORAL | Status: AC
  Administered 2017-12-30: 5 mg via ORAL
  Filled 2017-12-29: qty 1

## 2017-12-29 NOTE — ED Provider Notes (Signed)
Patient placed in Quick Look pathway, seen and evaluated   Chief Complaint: Abd pain (chronic)  HPI:   Here for acute on chronic abd painand distension, apparently his liver is "leaking fluid" and he is being worked up by GI. + ESRD, last dialyzed yesterday. No fevers. ROS: abd pain (one)  Physical Exam:   Gen: No distress  Neuro: Awake and Alert  Skin: Warm    Focused Exam: + abdominal distension   Initiation of care has begun. The patient has been counseled on the process, plan, and necessity for staying for the completion/evaluation, and the remainder of the medical screening examination    Margarita Mail, Hershal Coria 12/29/17 1826    Dorie Rank, MD 01/01/18 2025

## 2017-12-29 NOTE — ED Notes (Signed)
Patient transported to X-ray 

## 2017-12-29 NOTE — Discharge Instructions (Addendum)
Continue your current medications, follow-up with your primary care doctor, return as needed for fever, worsening symptoms

## 2017-12-29 NOTE — ED Notes (Signed)
Pt in room alert, no family present VS documented Call light within reach

## 2017-12-29 NOTE — ED Triage Notes (Signed)
Pt in via EMS to triage c/o abdominal pain, history of same and has an endoscopy scheduled for Tuesday, today pain worsened, denies n/v

## 2017-12-29 NOTE — ED Provider Notes (Signed)
[facility] EMERGENCY DEPARTMENT Provider Note   CSN: [id number] Arrival date & time: 12/29/17  1809     History   Chief Complaint Chief Complaint  Patient presents with  . Abdominal Pain    HPI [name] is a 57 y.o. male.  HPI Pt started having pain in his abdomen this past week.  Some vomiting earlier in the week but none recently.  He has had loose stools but that is related to his lactulose.  His pain is all over.  It has been getting worse.  No fevers.  Patient has a history of recurrent abdominal pain.  Patient has a history of frequent paracentesis associated with his ascites.  Last dialysis treatment was yesterday.   Past Medical History:  Diagnosis Date  . Anemia   . Anxiety   . Arthritis    left shoulder  . Atherosclerosis of aorta ([place])   . Cardiomegaly   . Chest pain    DATE UNKNOWN, C/O PERIODICALLY  . Cocaine abuse ([place])   . COPD exacerbation ([place]) 08/17/2016  . Coronary artery disease    stent 02/22/17  . ESRD (end stage renal disease) on dialysis ([place])    "[name]; MWF" (07/04/2017)  . GERD (gastroesophageal reflux disease)    DATE UNKNOWN  . Hemorrhoids   . Hepatitis B, chronic ([place])   . Hepatitis C   . History of kidney stones   . Hyperkalemia   . Hypertension   . Metabolic bone disease    Patient denies  . Mitral stenosis   . Myocardial infarction ([place])   . Pneumonia   . Pulmonary edema   . Solitary rectal ulcer syndrome 07/2017   at [facility] for rectal bleeding  . Tubular adenoma of colon     Patient Active Problem List   Diagnosis Date Noted  . ESRD (end stage renal disease) ([place]) 11/21/2017  . GERD (gastroesophageal reflux disease) 11/16/2017  . Liver cirrhosis ([place]) 11/15/2017  . DNR (do not resuscitate)   . Palliative care by specialist   . Hyponatremia 11/04/2017  . SBP (spontaneous bacterial peritonitis) ([place]) 10/30/2017  . Liver disease, chronic 10/30/2017  . SOB (shortness of breath)   .  Abdominal pain 10/28/2017  . Upper airway cough syndrome with flattening on f/v loop 10/13/17 c/w vcd 10/17/2017  . Elevated diaphragm 10/13/2017  . Ileus ([place]) 09/29/2017  . Malnutrition of moderate degree 09/29/2017  . Sinus congestion 09/03/2017  . Symptomatic anemia 09/02/2017  . Other cirrhosis of liver ([place]) 09/02/2017  . Left bundle branch block 09/02/2017  . Mitral stenosis 09/02/2017  . Hematochezia 07/15/2017  . Wide-complex tachycardia ([place])   . Endotracheally intubated   . ESRD on dialysis ([place]) 07/04/2017  . Acute respiratory failure with hypoxia ([place]) 06/18/2017  . CKD (chronic kidney disease) stage V requiring chronic dialysis ([place]) 06/18/2017  . History of Cocaine abuse ([place]) 06/18/2017  . Hypertension 06/18/2017  . Infection of AV graft for dialysis ([place]) 06/18/2017  . Anxiety 06/18/2017  . Anemia due to chronic kidney disease 06/18/2017  . Atrial flutter with rapid ventricular response ([place]) 06/18/2017  . Personality disorder ([place]) 06/13/2017  . Cellulitis 06/12/2017  . Adjustment disorder with mixed anxiety and depressed mood 06/10/2017  . Suicidal ideation 06/10/2017  . Arm wound, left, sequela 06/10/2017  . Dyspnea on exertion 05/29/2017  . Tachycardia 05/29/2017  . Hyperkalemia 05/22/2017  . Acute metabolic encephalopathy   . Anemia 04/23/2017  . Ascites 04/23/2017  . COPD (  chronic obstructive pulmonary disease) ([place]) 04/23/2017  . Acute on chronic respiratory failure with hypoxia ([place]) 03/25/2017  . Arrhythmia 03/25/2017  . COPD GOLD 0 with flattening on inps f/v  09/27/2016  . Essential hypertension 09/27/2016  . Fluid overload 08/30/2016  . COPD exacerbation ([place]) 08/17/2016  . Hypertensive urgency 08/17/2016  . Respiratory failure ([place]) 08/17/2016  . Problem with dialysis access ([place]) 07/23/2016  . Chronic hepatitis B ([place]) 03/05/2014  . Chronic hepatitis C without hepatic coma ([place]) 03/05/2014  . Internal hemorrhoids with bleeding, swelling and  itching 03/05/2014  . Thrombocytopenia ([place]) 03/05/2014  . Chest pain 02/27/2014  . Alcohol abuse 04/14/2009  . Cigarette smoker 04/14/2009  . GANGLION CYST 04/14/2009    Past Surgical History:  Procedure Laterality Date  . A/V FISTULAGRAM Left 05/26/2017   Procedure: A/V FISTULAGRAM;  Surgeon: Conrad Boyle, MD;  Location: [facility];  Service: Cardiovascular;  Laterality: Left;  . A/V FISTULAGRAM Right 11/18/2017   Procedure: A/V FISTULAGRAM - Right Arm;  Surgeon: Elam Dutch, MD;  Location: [facility];  Service: Cardiovascular;  Laterality: Right;  . APPLICATION OF WOUND VAC Left 06/14/2017   Procedure: APPLICATION OF WOUND VAC;  Surgeon: Katha Cabal, MD;  Location: [facility];  Service: Vascular;  Laterality: Left;  . AV FISTULA PLACEMENT  2012   BELIEVED WAS PLACED IN JUNE  . AV FISTULA PLACEMENT Right 08/09/2017   Procedure: Creation Right arm ARTERIOVENOUS BRACHIOCEPOHALIC FISTULA;  Surgeon: Elam Dutch, MD;  [facility];  Service: Vascular;  Laterality: Right;  . AV FISTULA PLACEMENT Right 11/22/2017   Procedure: INSERTION OF ARTERIOVENOUS (AV) GORE-TEX GRAFT RIGHT UPPER ARM;  Surgeon: Elam Dutch, MD;  Location: [facility];  Service: Vascular;  Laterality: Right;  . COLONOSCOPY    . CORONARY STENT INTERVENTION N/A 02/22/2017   Procedure: CORONARY STENT INTERVENTION;  Surgeon: Nigel Mormon, MD;  Location: [facility];  Service: Cardiovascular;  Laterality: N/A;  . FLEXIBLE SIGMOIDOSCOPY N/A 07/15/2017   Procedure: FLEXIBLE SIGMOIDOSCOPY;  Surgeon: Carol Ada, MD;  Location: [facility];  Service: Endoscopy;  Laterality: N/A;  . HEMORRHOID BANDING    . I&D EXTREMITY Left 06/01/2017   Procedure: IRRIGATION AND DEBRIDEMENT LEFT ARM HEMATOMA WITH LIGATION OF LEFT ARM AV FISTULA;  Surgeon: Elam Dutch, MD;  Location: [facility];  Service: Vascular;  Laterality: Left;  . I&D EXTREMITY Left 06/14/2017   Procedure: IRRIGATION AND  DEBRIDEMENT EXTREMITY;  Surgeon: Katha Cabal, MD;  Location: [facility];  Service: Vascular;  Laterality: Left;  . INSERTION OF DIALYSIS CATHETER  05/30/2017  . INSERTION OF DIALYSIS CATHETER N/A 05/30/2017   Procedure: INSERTION OF DIALYSIS CATHETER;  Surgeon: Elam Dutch, MD;  Location: [facility];  Service: Vascular;  Laterality: N/A;  . IR PARACENTESIS  08/30/2017  . IR PARACENTESIS  09/29/2017  . IR PARACENTESIS  10/28/2017  . IR PARACENTESIS  11/09/2017  . IR PARACENTESIS  11/16/2017  . IR PARACENTESIS  11/28/2017  . IR PARACENTESIS  12/01/2017  . IR PARACENTESIS  12/06/2017  . LEFT HEART CATH AND CORONARY ANGIOGRAPHY N/A 02/22/2017   Procedure: LEFT HEART CATH AND CORONARY ANGIOGRAPHY;  Surgeon: Nigel Mormon, MD;  Location: [facility];  Service: Cardiovascular;  Laterality: N/A;  . LIGATION OF ARTERIOVENOUS  FISTULA Left 09/11/6710   Procedure: Plication of Left Arm Arteriovenous Fistula;  Surgeon: Elam Dutch, MD;  Location: [facility];  Service: Vascular;  Laterality: Left;  .  POLYPECTOMY    . REVISON OF ARTERIOVENOUS FISTULA Left 1/61/0960   Procedure: PLICATION OF DISTAL ANEURYSMAL SEGEMENT OF LEFT UPPER ARM ARTERIOVENOUS FISTULA;  Surgeon: Elam Dutch, MD;  Location: [facility];  Service: Vascular;  Laterality: Left;  . REVISON OF ARTERIOVENOUS FISTULA Left 4/54/0981   Procedure: Plication of Left Upper Arm Fistula ;  Surgeon: Waynetta Sandy, MD;  Location: [facility];  Service: Vascular;  Laterality: Left;  . SKIN GRAFT SPLIT THICKNESS LEG / FOOT Left    SKIN GRAFT SPLIT THICKNESS LEFT ARM DONOR SITE: LEFT ANTERIOR THIGH  . SKIN SPLIT GRAFT Left 07/04/2017   Procedure: SKIN GRAFT SPLIT THICKNESS LEFT ARM DONOR SITE: LEFT ANTERIOR THIGH;  Surgeon: Elam Dutch, MD;  Location: [facility];  Service: Vascular;  Laterality: Left;  . THROMBECTOMY W/ EMBOLECTOMY Left 06/05/2017   Procedure: EXPLORATION OF LEFT ARM FOR BLEEDING; OVERSEWED PROXIMAL FISTULA;  Surgeon:  Angelia Mould, MD;  Location: [facility];  Service: Vascular;  Laterality: Left;  . WOUND EXPLORATION Left 06/03/2017   Procedure: WOUND EXPLORATION WITH WOUND VAC APPLICATION TO LEFT ARM;  Surgeon: Angelia Mould, MD;  Location: [facility];  Service: Vascular;  Laterality: Left;        Home Medications    Prior to Admission medications   Medication Sig Start Date End Date Taking? Authorizing Provider  albuterol (VENTOLIN HFA) 108 (90 Base) MCG/ACT inhaler Inhale 2 puffs into the lungs every 6 (six) hours as needed for wheezing or shortness of breath.    [provider]  budesonide-formoterol (SYMBICORT) 160-4.5 MCG/ACT inhaler Inhale 2 puffs into the lungs 2 (two) times daily.    [provider]  ciprofloxacin (CIPRO) 500 MG tablet Take 500 mg by mouth at bedtime.    [provider]  clopidogrel (PLAVIX) 75 MG tablet Take 75 mg by mouth daily.    [provider]  diltiazem (CARDIZEM CD) 120 MG 24 hr capsule Take 1 capsule (120 mg total) by mouth daily. 06/23/17 06/23/18  ShahmehdiValeria Batman, MD  famotidine (PEPCID) 20 MG tablet Take 20 mg by mouth daily.    [provider]  Fluticasone-Salmeterol (ADVAIR) 250-50 MCG/DOSE AEPB Inhale 1 puff into the lungs 2 (two) times daily.    [provider]  gabapentin (NEURONTIN) 100 MG capsule Take 100 mg by mouth 2 (two) times daily. 12/13/17   [provider]  hydrALAZINE (APRESOLINE) 100 MG tablet Take 1 tablet by mouth twice a day take after HD on dialysis days 08/31/17   [provider]  isosorbide mononitrate (IMDUR) 30 MG 24 hr tablet Take 1 tablet (30 mg total) by mouth daily. 06/17/17   [name], MD  lactulose, encephalopathy, (CHRONULAC) 10 GM/15ML SOLN Take 20 g by mouth 2 (two) times daily.    [provider]  oxyCODONE (OXY IR/ROXICODONE) 5 MG immediate release tablet Take 5 mg by mouth 2 (two) times daily as needed for pain. 12/13/17   [provider]  pantoprazole (PROTONIX) 40 MG tablet Take 40 mg by mouth daily.    [provider]  tiotropium (SPIRIVA) 18 MCG inhalation capsule Place 18 mcg into inhaler and inhale daily.    [provider]  traZODone (DESYREL) 150 MG tablet Take 150 mg by mouth at bedtime.    [provider]    Family History Family History  Problem Relation Age of Onset  . Heart disease Mother   . Lung cancer Mother   . Heart disease Father   .  Malignant hyperthermia Father   . COPD Father   . Throat cancer Sister   . Esophageal cancer Sister   . Hypertension Other   . COPD Other   . Colon cancer Neg Hx   . Colon polyps Neg Hx   . Rectal cancer Neg Hx   . Stomach cancer Neg Hx     Social History Social History   Tobacco Use  . Smoking status: Current Every Day Smoker    Packs/day: 0.50    Years: 43.00    Pack years: 21.50    Types: Cigarettes    Start date: 08/13/1973  . Smokeless tobacco: Never Used  Substance Use Topics  . Alcohol use: Not Currently    Frequency: Never    Comment: quit drinking in 2017  . Drug use: Not Currently    Comment: quit in 2017"     Allergies   Morphine and related; Aspirin; Clonidine derivatives; Tramadol; and Tylenol [acetaminophen]   Review of Systems Review of Systems  All other systems reviewed and are negative.    Physical Exam Updated Vital Signs BP (!) 194/93   Pulse 96   Temp 98.8 F (37.1 C) (Oral)   Resp 16   SpO2 99%   Physical Exam  Constitutional: He appears well-developed and well-nourished. No distress.  HENT:  Head: Normocephalic and atraumatic.  Right Ear: External ear normal.  Left Ear: External ear normal.  Eyes: Conjunctivae are normal. Right eye exhibits no discharge. Left eye exhibits no discharge. No scleral icterus.  Neck: Neck supple. No tracheal deviation present.  Cardiovascular: Normal rate, regular rhythm and intact distal pulses.  Pulmonary/Chest: Effort normal and breath sounds normal.  No stridor. No respiratory distress. He has no wheezes. He has no rales.  Abdominal: Soft. Bowel sounds are normal. He exhibits no distension. There is generalized tenderness. There is no rigidity, no rebound and no guarding. No hernia.  Mild ascites  Musculoskeletal: He exhibits no edema or tenderness.  Neurological: He is alert. He has normal strength. No cranial nerve deficit (no facial droop, extraocular movements intact, no slurred speech) or sensory deficit. He exhibits normal muscle tone. He displays no seizure activity. Coordination normal.  Skin: Skin is warm and dry. No rash noted.  Psychiatric: He has a normal mood and affect.  Nursing note and vitals reviewed.    ED Treatments / Results  Labs (all labs ordered are listed, but only abnormal results are displayed) Labs Reviewed  COMPREHENSIVE METABOLIC PANEL - Abnormal; Notable for the following components:      Result Value   Chloride 93 (*)    Glucose, Bld 141 (*)    BUN 25 (*)    Creatinine, Ser 6.62 (*)    Calcium 8.7 (*)    Albumin 3.4 (*)    ALT 13 (*)    Alkaline Phosphatase 204 (*)    GFR calc non Af Amer 8 (*)    GFR calc Af Amer 10 (*)    All other components within normal limits  CBC - Abnormal; Notable for the following components:   RBC 3.74 (*)    Hemoglobin 9.5 (*)    HCT 29.5 (*)    MCH 25.4 (*)    RDW 21.2 (*)    Platelets 130 (*)    All other components within normal limits  LIPASE, BLOOD    EKG None  Radiology Dg Abdomen Acute W/chest  Result Date: 12/29/2017 CLINICAL DATA:  Acute on chronic abdominal pain and distention. End-stage  renal disease, last dialysis yesterday. EXAM: DG ABDOMEN ACUTE W/ 1V CHEST COMPARISON:  Chest 12/23/2017 FINDINGS: Shallow inspiration with elevation of the left hemidiaphragm and mild linear atelectasis in the left lung base. Cardiac enlargement with mild central vascular congestion. No edema or consolidation. No blunting of costophrenic angles. No pneumothorax.  Right central venous catheter with tip over the right atrium. Calcification of the aorta. Gas-filled central mid abdominal small and large bowel with mild distention. There a few air-fluid levels on the upright view. This could represent early small bowel obstruction or enteritis. No free air. No radiopaque stones. Extensive vascular calcifications in the abdomen. Probable surgical clip in the pelvis without change. Degenerative changes in the spine and hips. IMPRESSION: 1. Cardiac enlargement with mild central vascular congestion. No edema or consolidation. 2. Gas-filled mildly distended central abdominal bowel with a few air-fluid levels suggesting early obstruction or enteritis. 3. Extensive vascular calcifications. Electronically Signed   By: Lucienne Capers M.D.   On: 12/29/2017 21:23    Procedures Procedures (including critical care time)  Medications Ordered in ED Medications  oxyCODONE (Oxy IR/ROXICODONE) immediate release tablet 5 mg (has no administration in time range)  HYDROmorphone (DILAUDID) injection 1 mg (1 mg Intravenous Given 12/29/17 2048)  diphenhydrAMINE (BENADRYL) injection 12.5 mg (12.5 mg Intravenous Given 12/29/17 2048)  HYDROmorphone (DILAUDID) injection 1 mg (1 mg Intravenous Given 12/29/17 2248)     Initial Impression / Assessment and Plan / ED Course  I have reviewed the triage vital signs and the nursing notes.  Pertinent labs & imaging results that were available during my care of the patient were reviewed by me and considered in my medical decision making (see chart for details).  Clinical Course as of Dec 29 2345  Thu Dec 29, 2017  2318 Labs reassuring.  Chronic kidney disease.  Stable anemia.  AAS with questionable obstruction vs enteritis.  Pt denies any nausea or vomiting.  Tolerating crackers and po fluids here.  Likely exacerbation of his chronic pain.  Stable for discharge.   [JK]  2329 40 oxycodone tablets on 12/13/2017   [JK]  2347 Patient asked for a  short prescription of opiates.  I explained to him that he will need to get this through his pain clinic   [JK]    Clinical Course User Index [JK] Dorie Rank, MD   Patient presented to the emergency room for evaluation of abdominal pain.  Patient has a history of recurrent abdominal pain, ascites and chronic pain syndrome.  Patient's not had any vomiting.  I doubt obstruction.  White blood cell count is normal.  No signs of peritonitis to suggest SBP.  Patient does not have any findings to suggest a large volume of ascites.  He appears stable to follow-up with his doctors as an outpatient   Final Clinical Impressions(s) / ED Diagnoses   Final diagnoses:  Abdominal pain, unspecified abdominal location    ED Discharge Orders    None       Dorie Rank, MD 12/29/17 2347

## 2017-12-30 ENCOUNTER — Encounter (HOSPITAL_COMMUNITY): Payer: Self-pay

## 2017-12-30 ENCOUNTER — Emergency Department (HOSPITAL_COMMUNITY): Payer: Medicare Other

## 2017-12-30 ENCOUNTER — Emergency Department (HOSPITAL_COMMUNITY)
Admission: EM | Admit: 2017-12-30 | Discharge: 2017-12-30 | Disposition: A | Discharge status: Home / Self Care | Payer: Medicare Other | Source: Home / Self Care | Attending: Emergency Medicine | Admitting: Emergency Medicine

## 2017-12-30 ENCOUNTER — Other Ambulatory Visit: Payer: Self-pay

## 2017-12-30 DIAGNOSIS — R51 Headache: Secondary | ICD-10-CM | POA: Diagnosis not present

## 2017-12-30 DIAGNOSIS — R069 Unspecified abnormalities of breathing: Secondary | ICD-10-CM | POA: Diagnosis not present

## 2017-12-30 DIAGNOSIS — F17210 Nicotine dependence, cigarettes, uncomplicated: Secondary | ICD-10-CM | POA: Insufficient documentation

## 2017-12-30 DIAGNOSIS — Z992 Dependence on renal dialysis: Secondary | ICD-10-CM

## 2017-12-30 DIAGNOSIS — Z79899 Other long term (current) drug therapy: Secondary | ICD-10-CM

## 2017-12-30 DIAGNOSIS — G8929 Other chronic pain: Secondary | ICD-10-CM

## 2017-12-30 DIAGNOSIS — I120 Hypertensive chronic kidney disease with stage 5 chronic kidney disease or end stage renal disease: Secondary | ICD-10-CM | POA: Insufficient documentation

## 2017-12-30 DIAGNOSIS — N186 End stage renal disease: Secondary | ICD-10-CM

## 2017-12-30 DIAGNOSIS — D509 Iron deficiency anemia, unspecified: Secondary | ICD-10-CM | POA: Diagnosis not present

## 2017-12-30 DIAGNOSIS — J189 Pneumonia, unspecified organism: Secondary | ICD-10-CM

## 2017-12-30 DIAGNOSIS — R0602 Shortness of breath: Secondary | ICD-10-CM | POA: Diagnosis not present

## 2017-12-30 DIAGNOSIS — D631 Anemia in chronic kidney disease: Secondary | ICD-10-CM | POA: Diagnosis not present

## 2017-12-30 DIAGNOSIS — R0600 Dyspnea, unspecified: Secondary | ICD-10-CM

## 2017-12-30 DIAGNOSIS — R1084 Generalized abdominal pain: Secondary | ICD-10-CM | POA: Insufficient documentation

## 2017-12-30 DIAGNOSIS — R062 Wheezing: Secondary | ICD-10-CM | POA: Diagnosis not present

## 2017-12-30 DIAGNOSIS — I447 Left bundle-branch block, unspecified: Secondary | ICD-10-CM | POA: Diagnosis not present

## 2017-12-30 DIAGNOSIS — I10 Essential (primary) hypertension: Secondary | ICD-10-CM | POA: Diagnosis not present

## 2017-12-30 DIAGNOSIS — R109 Unspecified abdominal pain: Secondary | ICD-10-CM

## 2017-12-30 DIAGNOSIS — N2581 Secondary hyperparathyroidism of renal origin: Secondary | ICD-10-CM | POA: Diagnosis not present

## 2017-12-30 LAB — COMPREHENSIVE METABOLIC PANEL
ALT: 12 U/L — ABNORMAL LOW (ref 17–63)
AST: 18 U/L (ref 15–41)
Albumin: 3.4 g/dL — ABNORMAL LOW (ref 3.5–5.0)
Alkaline Phosphatase: 199 U/L — ABNORMAL HIGH (ref 38–126)
Anion gap: 13 (ref 5–15)
BUN: 17 mg/dL (ref 6–20)
CO2: 28 mmol/L (ref 22–32)
Calcium: 9 mg/dL (ref 8.9–10.3)
Chloride: 93 mmol/L — ABNORMAL LOW (ref 101–111)
Creatinine, Ser: 5.47 mg/dL — ABNORMAL HIGH (ref 0.61–1.24)
GFR calc Af Amer: 12 mL/min — ABNORMAL LOW (ref >60)
GFR calc non Af Amer: 11 mL/min — ABNORMAL LOW (ref >60)
Glucose, Bld: 101 mg/dL — ABNORMAL HIGH (ref 65–99)
Potassium: 4.1 mmol/L (ref 3.5–5.1)
Sodium: 134 mmol/L — ABNORMAL LOW (ref 135–145)
Total Bilirubin: 0.7 mg/dL (ref 0.3–1.2)
Total Protein: 7 g/dL (ref 6.5–8.1)

## 2017-12-30 LAB — CBC
HCT: 28.4 % — ABNORMAL LOW (ref 39.0–52.0)
Hemoglobin: 9.4 g/dL — ABNORMAL LOW (ref 13.0–17.0)
MCH: 25.9 pg — ABNORMAL LOW (ref 26.0–34.0)
MCHC: 33.1 g/dL (ref 30.0–36.0)
MCV: 78.2 fL (ref 78.0–100.0)
Platelets: 120 10*3/uL — ABNORMAL LOW (ref 150–400)
RBC: 3.63 MIL/uL — ABNORMAL LOW (ref 4.22–5.81)
RDW: 21.1 % — ABNORMAL HIGH (ref 11.5–15.5)
WBC: 9.5 10*3/uL (ref 4.0–10.5)

## 2017-12-30 LAB — I-STAT TROPONIN, ED: Troponin i, poc: 0.06 ng/mL (ref 0.00–0.08)

## 2017-12-30 MED ORDER — AMOXICILLIN-POT CLAVULANATE 875-125 MG PO TABS: 2.0000 | ORAL_TABLET | Freq: Two times a day (BID) | ORAL | 0 refills | Status: DC

## 2017-12-30 MED ORDER — HYDROMORPHONE HCL 1 MG/ML IJ SOLN
1.0000 mg | Freq: Once | INTRAMUSCULAR | Status: AC
  Administered 2017-12-30: 1 mg via INTRAVENOUS
  Filled 2017-12-30: qty 1

## 2017-12-30 MED ORDER — DOXYCYCLINE HYCLATE 100 MG PO CAPS: 100.0000 mg | ORAL_CAPSULE | Freq: Two times a day (BID) | ORAL | 0 refills | Status: DC

## 2017-12-30 MED ORDER — DOXYCYCLINE HYCLATE 100 MG PO TABS
100.0000 mg | ORAL_TABLET | Freq: Once | ORAL | Status: DC
  Filled 2017-12-30: qty 1

## 2017-12-30 MED ORDER — AMOXICILLIN-POT CLAVULANATE 875-125 MG PO TABS
2.0000 | ORAL_TABLET | Freq: Two times a day (BID) | ORAL | Status: DC
  Administered 2017-12-30: 2 via ORAL
  Filled 2017-12-30: qty 2

## 2017-12-30 NOTE — ED Notes (Addendum)
Pt refused to put gown on. Speaking in complete sentences. Pt can't remember when last full dialysis treatment was.

## 2017-12-30 NOTE — ED Triage Notes (Signed)
Pt from home with complaint of difficulty breathing that began while taking dialysis today. Did not finish dialysis. 200/110, HR 100-110 LBBB, RR 24, spo2 95% RA, wheezing noted, pt given 5mg  albuterol. Axox4.

## 2017-12-30 NOTE — Discharge Instructions (Signed)
Please take Augmentin and Doxycycline for the next 5 days Continue to use your inhaler as needed for wheezing/shortness of breath

## 2017-12-31 NOTE — ED Provider Notes (Signed)
[facility] EMERGENCY DEPARTMENT Provider Note   CSN: [id number] Arrival date & time: 12/30/17  2050     History   Chief Complaint Chief Complaint  Patient presents with  . Shortness of Breath    HPI [name] is a 57 y.o. male with extensive PMH who presents with shortness of breath, headache, abdominal pain.  He states that he has been coughing and wheezing for the past week.  He has not been using his inhaler.  He went to dialysis today but was not able to complete his treatment because he did not feel well.  He reports generalized abdominal pain and swelling due to ascites.  He is taking his Cipro as prescribed.  He called EMS when he got home because of the shortness of breath.  He received a breathing treatment and his breathing feels better however he still having a headache and abdominal pain.  His abdominal pain is diffuse and chronic.  He has a decreased appetite due to the swelling.  He denies fevers or chest pain.  HPI  Past Medical History:  Diagnosis Date  . Anemia   . Anxiety   . Arthritis    left shoulder  . Atherosclerosis of aorta ([place])   . Cardiomegaly   . Chest pain    DATE UNKNOWN, C/O PERIODICALLY  . Cocaine abuse ([place])   . COPD exacerbation ([place]) 08/17/2016  . Coronary artery disease    stent 02/22/17  . ESRD (end stage renal disease) on dialysis ([place])    "[name]; MWF" (07/04/2017)  . GERD (gastroesophageal reflux disease)    DATE UNKNOWN  . Hemorrhoids   . Hepatitis B, chronic ([place])   . Hepatitis C   . History of kidney stones   . Hyperkalemia   . Hypertension   . Metabolic bone disease    Patient denies  . Mitral stenosis   . Myocardial infarction ([place])   . Pneumonia   . Pulmonary edema   . Solitary rectal ulcer syndrome 07/2017   at [facility] for rectal bleeding  . Tubular adenoma of colon     Patient Active Problem List   Diagnosis Date Noted  . ESRD (end stage renal disease) ([place]) 11/21/2017  . GERD  (gastroesophageal reflux disease) 11/16/2017  . Liver cirrhosis ([place]) 11/15/2017  . DNR (do not resuscitate)   . Palliative care by specialist   . Hyponatremia 11/04/2017  . SBP (spontaneous bacterial peritonitis) ([place]) 10/30/2017  . Liver disease, chronic 10/30/2017  . SOB (shortness of breath)   . Abdominal pain 10/28/2017  . Upper airway cough syndrome with flattening on f/v loop 10/13/17 c/w vcd 10/17/2017  . Elevated diaphragm 10/13/2017  . Ileus ([place]) 09/29/2017  . Malnutrition of moderate degree 09/29/2017  . Sinus congestion 09/03/2017  . Symptomatic anemia 09/02/2017  . Other cirrhosis of liver ([place]) 09/02/2017  . Left bundle branch block 09/02/2017  . Mitral stenosis 09/02/2017  . Hematochezia 07/15/2017  . Wide-complex tachycardia ([place])   . Endotracheally intubated   . ESRD on dialysis ([place]) 07/04/2017  . Acute respiratory failure with hypoxia ([place]) 06/18/2017  . CKD (chronic kidney disease) stage V requiring chronic dialysis ([place]) 06/18/2017  . History of Cocaine abuse ([place]) 06/18/2017  . Hypertension 06/18/2017  . Infection of AV graft for dialysis ([place]) 06/18/2017  . Anxiety 06/18/2017  . Anemia due to chronic kidney disease 06/18/2017  . Atrial flutter with rapid ventricular response ([place]) 06/18/2017  . Personality disorder ([place]) 06/13/2017  .  Cellulitis 06/12/2017  . Adjustment disorder with mixed anxiety and depressed mood 06/10/2017  . Suicidal ideation 06/10/2017  . Arm wound, left, sequela 06/10/2017  . Dyspnea on exertion 05/29/2017  . Tachycardia 05/29/2017  . Hyperkalemia 05/22/2017  . Acute metabolic encephalopathy   . Anemia 04/23/2017  . Ascites 04/23/2017  . COPD (chronic obstructive pulmonary disease) ([place]) 04/23/2017  . Acute on chronic respiratory failure with hypoxia ([place]) 03/25/2017  . Arrhythmia 03/25/2017  . COPD GOLD 0 with flattening on inps f/v  09/27/2016  . Essential hypertension 09/27/2016  . Fluid overload 08/30/2016  . COPD  exacerbation ([place]) 08/17/2016  . Hypertensive urgency 08/17/2016  . Respiratory failure ([place]) 08/17/2016  . Problem with dialysis access ([place]) 07/23/2016  . Chronic hepatitis B ([place]) 03/05/2014  . Chronic hepatitis C without hepatic coma ([place]) 03/05/2014  . Internal hemorrhoids with bleeding, swelling and itching 03/05/2014  . Thrombocytopenia ([place]) 03/05/2014  . Chest pain 02/27/2014  . Alcohol abuse 04/14/2009  . Cigarette smoker 04/14/2009  . GANGLION CYST 04/14/2009    Past Surgical History:  Procedure Laterality Date  . A/V FISTULAGRAM Left 05/26/2017   Procedure: A/V FISTULAGRAM;  Surgeon: Conrad [place], MD;  Location: [facility];  Service: Cardiovascular;  Laterality: Left;  . A/V FISTULAGRAM Right 11/18/2017   Procedure: A/V FISTULAGRAM - Right Arm;  Surgeon: Elam Dutch, MD;  Location: [facility];  Service: Cardiovascular;  Laterality: Right;  . APPLICATION OF WOUND VAC Left 06/14/2017   Procedure: APPLICATION OF WOUND VAC;  Surgeon: Katha Cabal, MD;  Location: [facility];  Service: Vascular;  Laterality: Left;  . AV FISTULA PLACEMENT  2012   BELIEVED WAS PLACED IN JUNE  . AV FISTULA PLACEMENT Right 08/09/2017   Procedure: Creation Right arm ARTERIOVENOUS BRACHIOCEPOHALIC FISTULA;  Surgeon: Elam Dutch, MD;  [facility];  Service: Vascular;  Laterality: Right;  . AV FISTULA PLACEMENT Right 11/22/2017   Procedure: INSERTION OF ARTERIOVENOUS (AV) GORE-TEX GRAFT RIGHT UPPER ARM;  Surgeon: Elam Dutch, MD;  Location: [facility];  Service: Vascular;  Laterality: Right;  . COLONOSCOPY    . CORONARY STENT INTERVENTION N/A 02/22/2017   Procedure: CORONARY STENT INTERVENTION;  Surgeon: Nigel Mormon, MD;  Location: [facility];  Service: Cardiovascular;  Laterality: N/A;  . FLEXIBLE SIGMOIDOSCOPY N/A 07/15/2017   Procedure: FLEXIBLE SIGMOIDOSCOPY;  Surgeon: Carol Ada, MD;  Location: [facility];  Service: Endoscopy;  Laterality: N/A;    . HEMORRHOID BANDING    . I&D EXTREMITY Left 06/01/2017   Procedure: IRRIGATION AND DEBRIDEMENT LEFT ARM HEMATOMA WITH LIGATION OF LEFT ARM AV FISTULA;  Surgeon: Elam Dutch, MD;  Location: [facility];  Service: Vascular;  Laterality: Left;  . I&D EXTREMITY Left 06/14/2017   Procedure: IRRIGATION AND DEBRIDEMENT EXTREMITY;  Surgeon: Katha Cabal, MD;  Location: [facility];  Service: Vascular;  Laterality: Left;  . INSERTION OF DIALYSIS CATHETER  05/30/2017  . INSERTION OF DIALYSIS CATHETER N/A 05/30/2017   Procedure: INSERTION OF DIALYSIS CATHETER;  Surgeon: Elam Dutch, MD;  Location: [facility];  Service: Vascular;  Laterality: N/A;  . IR PARACENTESIS  08/30/2017  . IR PARACENTESIS  09/29/2017  . IR PARACENTESIS  10/28/2017  . IR PARACENTESIS  11/09/2017  . IR PARACENTESIS  11/16/2017  . IR PARACENTESIS  11/28/2017  . IR PARACENTESIS  12/01/2017  . IR PARACENTESIS  12/06/2017  . LEFT HEART CATH AND CORONARY ANGIOGRAPHY N/A 02/22/2017   Procedure: LEFT HEART CATH  AND CORONARY ANGIOGRAPHY;  Surgeon: Nigel Mormon, MD;  Location: [facility];  Service: Cardiovascular;  Laterality: N/A;  . LIGATION OF ARTERIOVENOUS  FISTULA Left 10/11/7060   Procedure: Plication of Left Arm Arteriovenous Fistula;  Surgeon: Elam Dutch, MD;  Location: [facility];  Service: Vascular;  Laterality: Left;  . POLYPECTOMY    . REVISON OF ARTERIOVENOUS FISTULA Left 3/76/2831   Procedure: PLICATION OF DISTAL ANEURYSMAL SEGEMENT OF LEFT UPPER ARM ARTERIOVENOUS FISTULA;  Surgeon: Elam Dutch, MD;  Location: [facility];  Service: Vascular;  Laterality: Left;  . REVISON OF ARTERIOVENOUS FISTULA Left 11/26/6158   Procedure: Plication of Left Upper Arm Fistula ;  Surgeon: Waynetta Sandy, MD;  Location: [facility];  Service: Vascular;  Laterality: Left;  . SKIN GRAFT SPLIT THICKNESS LEG / FOOT Left    SKIN GRAFT SPLIT THICKNESS LEFT ARM DONOR SITE: LEFT ANTERIOR THIGH  . SKIN SPLIT GRAFT Left 07/04/2017    Procedure: SKIN GRAFT SPLIT THICKNESS LEFT ARM DONOR SITE: LEFT ANTERIOR THIGH;  Surgeon: Elam Dutch, MD;  Location: [facility];  Service: Vascular;  Laterality: Left;  . THROMBECTOMY W/ EMBOLECTOMY Left 06/05/2017   Procedure: EXPLORATION OF LEFT ARM FOR BLEEDING; OVERSEWED PROXIMAL FISTULA;  Surgeon: Angelia Mould, MD;  Location: [facility];  Service: Vascular;  Laterality: Left;  . WOUND EXPLORATION Left 06/03/2017   Procedure: WOUND EXPLORATION WITH WOUND VAC APPLICATION TO LEFT ARM;  Surgeon: Angelia Mould, MD;  Location: [facility];  Service: Vascular;  Laterality: Left;        Home Medications    Prior to Admission medications   Medication Sig Start Date End Date Taking? Authorizing Provider  albuterol (VENTOLIN HFA) 108 (90 Base) MCG/ACT inhaler Inhale 2 puffs into the lungs every 6 (six) hours as needed for wheezing or shortness of breath.    [provider]  amoxicillin-clavulanate (AUGMENTIN) 875-125 MG tablet Take 2 tablets by mouth 2 (two) times daily. 12/30/17   Recardo Evangelist, PA-C  budesonide-formoterol (SYMBICORT) 160-4.5 MCG/ACT inhaler Inhale 2 puffs into the lungs 2 (two) times daily.    [provider]  ciprofloxacin (CIPRO) 500 MG tablet Take 500 mg by mouth at bedtime.    [provider]  clopidogrel (PLAVIX) 75 MG tablet Take 75 mg by mouth daily.    [provider]  diltiazem (CARDIZEM CD) 120 MG 24 hr capsule Take 1 capsule (120 mg total) by mouth daily. 06/23/17 06/23/18  Deatra James, MD  doxycycline (VIBRAMYCIN) 100 MG capsule Take 1 capsule (100 mg total) by mouth 2 (two) times daily. 12/30/17   Recardo Evangelist, PA-C  famotidine (PEPCID) 20 MG tablet Take 20 mg by mouth daily.    [provider]  Fluticasone-Salmeterol (ADVAIR) 250-50 MCG/DOSE AEPB Inhale 1 puff into the lungs 2 (two) times daily.    [provider]  gabapentin (NEURONTIN) 100 MG capsule Take 100 mg by mouth 2 (two) times  daily. 12/13/17   [provider]  hydrALAZINE (APRESOLINE) 100 MG tablet Take 1 tablet by mouth twice a day take after HD on dialysis days 08/31/17   [provider]  isosorbide mononitrate (IMDUR) 30 MG 24 hr tablet Take 1 tablet (30 mg total) by mouth daily. 06/17/17   [name], MD  lactulose, encephalopathy, (CHRONULAC) 10 GM/15ML SOLN Take 20 g by mouth 2 (two) times daily.    [provider]  oxyCODONE (OXY IR/ROXICODONE) 5 MG immediate release tablet Take 5 mg  by mouth 2 (two) times daily as needed for pain. 12/13/17   [provider]  pantoprazole (PROTONIX) 40 MG tablet Take 40 mg by mouth daily.    [provider]  tiotropium (SPIRIVA) 18 MCG inhalation capsule Place 18 mcg into inhaler and inhale daily.    [provider]  traZODone (DESYREL) 150 MG tablet Take 150 mg by mouth at bedtime.    [provider]    Family History Family History  Problem Relation Age of Onset  . Heart disease Mother   . Lung cancer Mother   . Heart disease Father   . Malignant hyperthermia Father   . COPD Father   . Throat cancer Sister   . Esophageal cancer Sister   . Hypertension Other   . COPD Other   . Colon cancer Neg Hx   . Colon polyps Neg Hx   . Rectal cancer Neg Hx   . Stomach cancer Neg Hx     Social History Social History   Tobacco Use  . Smoking status: Current Every Day Smoker    Packs/day: 0.50    Years: 43.00    Pack years: 21.50    Types: Cigarettes    Start date: 08/13/1973  . Smokeless tobacco: Never Used  Substance Use Topics  . Alcohol use: Not Currently    Frequency: Never    Comment: quit drinking in 2017  . Drug use: Not Currently    Comment: quit in 2017"     Allergies   Morphine and related; Aspirin; Clonidine derivatives; Tramadol; and Tylenol [acetaminophen]   Review of Systems Review of Systems  Constitutional: Negative for fever.  Respiratory: Positive for cough, shortness of breath and  wheezing.   Cardiovascular: Negative for chest pain.  Gastrointestinal: Positive for abdominal pain. Negative for diarrhea, nausea and vomiting.  Neurological: Positive for headaches.  All other systems reviewed and are negative.    Physical Exam Updated Vital Signs BP (!) 163/74   Pulse (!) 25   Temp 97.8 F (36.6 C) (Oral)   Resp (!) 22   Ht 5\' 9"  (1.753 m)   Wt 72.1 kg (159 lb)   SpO2 91%   BMI 23.48 kg/m   Physical Exam  Constitutional: He is oriented to person, place, and time. He appears well-developed and well-nourished. No distress.  Chronically ill-appearing  HENT:  Head: Normocephalic and atraumatic.  Eyes: Pupils are equal, round, and reactive to light. Conjunctivae are normal. Right eye exhibits no discharge. Left eye exhibits no discharge. No scleral icterus.  Neck: Normal range of motion.  Cardiovascular: Normal rate and regular rhythm.  Pulmonary/Chest: Effort normal and breath sounds normal. No respiratory distress.  Catheter and right upper chest wall  Abdominal: Soft. Bowel sounds are normal. He exhibits distension. There is tenderness.  Diffuse tenderness.  There is swelling and mild ascites  Musculoskeletal:  Right sided fistula with palpable thrill  Neurological: He is alert and oriented to person, place, and time.  Skin: Skin is warm and dry.  Psychiatric: He has a normal mood and affect. His behavior is normal.  Nursing note and vitals reviewed.    ED Treatments / Results  Labs (all labs ordered are listed, but only abnormal results are displayed) Labs Reviewed  COMPREHENSIVE METABOLIC PANEL - Abnormal; Notable for the following components:      Result Value   Sodium 134 (*)    Chloride 93 (*)    Glucose, Bld 101 (*)    Creatinine, Ser  5.47 (*)    Albumin 3.4 (*)    ALT 12 (*)    Alkaline Phosphatase 199 (*)    GFR calc non Af Amer 11 (*)    GFR calc Af Amer 12 (*)    All other components within normal limits  CBC - Abnormal; Notable  for the following components:   RBC 3.63 (*)    Hemoglobin 9.4 (*)    HCT 28.4 (*)    MCH 25.9 (*)    RDW 21.1 (*)    Platelets 120 (*)    All other components within normal limits  I-STAT TROPONIN, ED    EKG None  Radiology Dg Chest 2 View  Result Date: 12/30/2017 CLINICAL DATA:  57 year old male with increasing shortness of breath for 2 days. Dialysis today. Wheezing. EXAM: CHEST - 2 VIEW COMPARISON:  Chest and abdominal series 12/29/2017 and earlier. FINDINGS: Stable right chest dual lumen dialysis catheter. Stable cardiomegaly and mediastinal contours. Stable lung volumes. Pulmonary vascularity appears stable without pulmonary edema. No pneumothorax. No pleural effusion. There is streaky increased lower lobe opacity on the lateral view. No consolidation. Visualized tracheal air column is within normal limits. Negative visible bowel gas pattern. Bony sclerosis likely reflecting renal osteodystrophy. IMPRESSION: 1. Nonspecific mild streaky lower lobe opacity. If there are signs and symptoms of infection consider developing pneumonia. 2. No pleural effusion or other acute cardiopulmonary abnormality. 3. Stable cardiomegaly. Electronically Signed   By: Genevie Ann M.D.   On: 12/30/2017 22:18   Dg Abdomen Acute W/chest  Result Date: 12/29/2017 CLINICAL DATA:  Acute on chronic abdominal pain and distention. End-stage renal disease, last dialysis yesterday. EXAM: DG ABDOMEN ACUTE W/ 1V CHEST COMPARISON:  Chest 12/23/2017 FINDINGS: Shallow inspiration with elevation of the left hemidiaphragm and mild linear atelectasis in the left lung base. Cardiac enlargement with mild central vascular congestion. No edema or consolidation. No blunting of costophrenic angles. No pneumothorax. Right central venous catheter with tip over the right atrium. Calcification of the aorta. Gas-filled central mid abdominal small and large bowel with mild distention. There a few air-fluid levels on the upright view. This could  represent early small bowel obstruction or enteritis. No free air. No radiopaque stones. Extensive vascular calcifications in the abdomen. Probable surgical clip in the pelvis without change. Degenerative changes in the spine and hips. IMPRESSION: 1. Cardiac enlargement with mild central vascular congestion. No edema or consolidation. 2. Gas-filled mildly distended central abdominal bowel with a few air-fluid levels suggesting early obstruction or enteritis. 3. Extensive vascular calcifications. Electronically Signed   By: Lucienne Capers M.D.   On: 12/29/2017 21:23    Procedures Procedures (including critical care time)  Medications Ordered in ED Medications  doxycycline (VIBRA-TABS) tablet 100 mg (100 mg Oral Not Given 12/30/17 2328)  amoxicillin-clavulanate (AUGMENTIN) 875-125 MG per tablet 2 tablet (2 tablets Oral Given 12/30/17 2328)  HYDROmorphone (DILAUDID) injection 1 mg (1 mg Intravenous Given 12/30/17 2328)     Initial Impression / Assessment and Plan / ED Course  I have reviewed the triage vital signs and the nursing notes.  Pertinent labs & imaging results that were available during my care of the patient were reviewed by me and considered in my medical decision making (see chart for details).  57 year old male presents with shortness of breath, headache, abdominal pain.  He is mildly tachycardic and hypertensive on arrival.  He states this is because he is in so much pain.  There also is a documentation of a heart  rate of 25 which was documented in error. He received a breathing treatment and states that his shortness of breath is somewhat better. He has not had hypoxic in the ED. On exam heart is regular rate and rhythm.  Lungs are clear to auscultation.  He has mild generalized abdominal tenderness and swelling which is chronic.  EKG is sinus rhythm with multiple abnormalities however appears unchanged from prior.  His chest x-ray shows a possible streaky opacity.  His CBC shows  baseline anemia.  His CMP has many derangements but is overall at baseline.  His troponin is normal.  We will cover him with Augmentin and doxycycline due to his multiple chronic medical problems and he was given a dose of pain medicine and discharged  Final Clinical Impressions(s) / ED Diagnoses   Final diagnoses:  Dyspnea, unspecified type  Chronic abdominal pain  Community acquired pneumonia, unspecified laterality    ED Discharge Orders        Ordered    amoxicillin-clavulanate (AUGMENTIN) 875-125 MG tablet  2 times daily     12/30/17 2329    doxycycline (VIBRAMYCIN) 100 MG capsule  2 times daily     12/30/17 2329       Recardo Evangelist, PA-C 12/31/17 0144    Tanna Furry, MD 01/06/18 [phone]

## 2018-01-02 ENCOUNTER — Telehealth: Payer: Self-pay | Admitting: Internal Medicine

## 2018-01-02 ENCOUNTER — Emergency Department (HOSPITAL_COMMUNITY)
Admission: EM | Admit: 2018-01-02 | Discharge: 2018-01-02 | Disposition: A | Discharge status: Home / Self Care | Payer: Medicare Other | Attending: Emergency Medicine | Admitting: Emergency Medicine

## 2018-01-02 ENCOUNTER — Emergency Department (HOSPITAL_COMMUNITY): Payer: Medicare Other

## 2018-01-02 ENCOUNTER — Other Ambulatory Visit: Payer: Self-pay

## 2018-01-02 ENCOUNTER — Encounter (HOSPITAL_COMMUNITY): Payer: Self-pay | Admitting: *Deleted

## 2018-01-02 DIAGNOSIS — Z955 Presence of coronary angioplasty implant and graft: Secondary | ICD-10-CM | POA: Diagnosis not present

## 2018-01-02 DIAGNOSIS — F17210 Nicotine dependence, cigarettes, uncomplicated: Secondary | ICD-10-CM | POA: Diagnosis not present

## 2018-01-02 DIAGNOSIS — J449 Chronic obstructive pulmonary disease, unspecified: Secondary | ICD-10-CM | POA: Diagnosis not present

## 2018-01-02 DIAGNOSIS — Z992 Dependence on renal dialysis: Secondary | ICD-10-CM | POA: Diagnosis not present

## 2018-01-02 DIAGNOSIS — R110 Nausea: Secondary | ICD-10-CM | POA: Diagnosis not present

## 2018-01-02 DIAGNOSIS — I2510 Atherosclerotic heart disease of native coronary artery without angina pectoris: Secondary | ICD-10-CM | POA: Diagnosis not present

## 2018-01-02 DIAGNOSIS — R1084 Generalized abdominal pain: Secondary | ICD-10-CM | POA: Diagnosis not present

## 2018-01-02 DIAGNOSIS — K7460 Unspecified cirrhosis of liver: Secondary | ICD-10-CM | POA: Diagnosis not present

## 2018-01-02 DIAGNOSIS — N186 End stage renal disease: Secondary | ICD-10-CM | POA: Diagnosis not present

## 2018-01-02 DIAGNOSIS — I120 Hypertensive chronic kidney disease with stage 5 chronic kidney disease or end stage renal disease: Secondary | ICD-10-CM | POA: Diagnosis not present

## 2018-01-02 DIAGNOSIS — R197 Diarrhea, unspecified: Secondary | ICD-10-CM | POA: Diagnosis not present

## 2018-01-02 DIAGNOSIS — R100 Acute abdomen: Secondary | ICD-10-CM | POA: Diagnosis not present

## 2018-01-02 DIAGNOSIS — Z79899 Other long term (current) drug therapy: Secondary | ICD-10-CM | POA: Diagnosis not present

## 2018-01-02 DIAGNOSIS — R109 Unspecified abdominal pain: Secondary | ICD-10-CM

## 2018-01-02 LAB — COMPREHENSIVE METABOLIC PANEL
ALT: 10 U/L — ABNORMAL LOW (ref 17–63)
AST: 17 U/L (ref 15–41)
Albumin: 3.4 g/dL — ABNORMAL LOW (ref 3.5–5.0)
Alkaline Phosphatase: 205 U/L — ABNORMAL HIGH (ref 38–126)
Anion gap: 14 (ref 5–15)
BUN: 31 mg/dL — ABNORMAL HIGH (ref 6–20)
CO2: 25 mmol/L (ref 22–32)
Calcium: 9 mg/dL (ref 8.9–10.3)
Chloride: 98 mmol/L — ABNORMAL LOW (ref 101–111)
Creatinine, Ser: 9.1 mg/dL — ABNORMAL HIGH (ref 0.61–1.24)
GFR calc Af Amer: 7 mL/min — ABNORMAL LOW (ref >60)
GFR calc non Af Amer: 6 mL/min — ABNORMAL LOW (ref >60)
Glucose, Bld: 109 mg/dL — ABNORMAL HIGH (ref 65–99)
Potassium: 4.6 mmol/L (ref 3.5–5.1)
Sodium: 137 mmol/L (ref 135–145)
Total Bilirubin: 0.9 mg/dL (ref 0.3–1.2)
Total Protein: 7.1 g/dL (ref 6.5–8.1)

## 2018-01-02 LAB — CBC
HCT: 28 % — ABNORMAL LOW (ref 39.0–52.0)
Hemoglobin: 9.1 g/dL — ABNORMAL LOW (ref 13.0–17.0)
MCH: 25.5 pg — ABNORMAL LOW (ref 26.0–34.0)
MCHC: 32.5 g/dL (ref 30.0–36.0)
MCV: 78.4 fL (ref 78.0–100.0)
Platelets: 122 10*3/uL — ABNORMAL LOW (ref 150–400)
RBC: 3.57 MIL/uL — ABNORMAL LOW (ref 4.22–5.81)
RDW: 21.2 % — ABNORMAL HIGH (ref 11.5–15.5)
WBC: 8.5 10*3/uL (ref 4.0–10.5)

## 2018-01-02 LAB — LIPASE, BLOOD: Lipase: 37 U/L (ref 11–51)

## 2018-01-02 MED ORDER — DICYCLOMINE HCL 10 MG PO CAPS
10.0000 mg | ORAL_CAPSULE | Freq: Once | ORAL | Status: AC
  Administered 2018-01-02: 10 mg via ORAL
  Filled 2018-01-02: qty 1

## 2018-01-02 MED ORDER — MORPHINE SULFATE (PF) 4 MG/ML IV SOLN
6.0000 mg | Freq: Once | INTRAVENOUS | Status: DC
  Filled 2018-01-02: qty 2

## 2018-01-02 MED ORDER — IOHEXOL 300 MG/ML  SOLN
100.0000 mL | Freq: Once | INTRAMUSCULAR | Status: AC | PRN
  Administered 2018-01-02: 100 mL via INTRAVENOUS

## 2018-01-02 MED ORDER — FENTANYL CITRATE (PF) 100 MCG/2ML IJ SOLN
100.0000 ug | Freq: Once | INTRAMUSCULAR | Status: AC
  Administered 2018-01-02: 100 ug via INTRAVENOUS
  Filled 2018-01-02: qty 2

## 2018-01-02 NOTE — ED Provider Notes (Signed)
[facility] EMERGENCY DEPARTMENT Provider Note   CSN: [id number] Arrival date & time: 01/02/18  0257     History   Chief Complaint Chief Complaint  Patient presents with  . Abdominal Pain    HPI [name] is a 57 y.o. male.  HPI Patient is a 57 year old male with end-stage renal disease who dialyzes on Monday Wednesday Friday.  He presents the emergency department with abdominal pain.  He has a long-standing history of recurrent abdominal pain.  He is scheduled for endoscopy and colonoscopy with his GI team tomorrow.  He states he is developed some diarrhea over the past 24 hours without blood.  Denies nausea vomiting.  Reports moderate to severe generalized abdominal pain without focality.  No fevers or chills.  No longer makes urine.  Denies chest pain.  No shortness of breath.  No back pain.  Pain is moderate to severe in severity.   Past Medical History:  Diagnosis Date  . Anemia   . Anxiety   . Arthritis    left shoulder  . Atherosclerosis of aorta ([place])   . Cardiomegaly   . Chest pain    DATE UNKNOWN, C/O PERIODICALLY  . Cocaine abuse ([place])   . COPD exacerbation ([place]) 08/17/2016  . Coronary artery disease    stent 02/22/17  . ESRD (end stage renal disease) on dialysis ([place])    "[name]; MWF" (07/04/2017)  . GERD (gastroesophageal reflux disease)    DATE UNKNOWN  . Hemorrhoids   . Hepatitis B, chronic ([place])   . Hepatitis C   . History of kidney stones   . Hyperkalemia   . Hypertension   . Metabolic bone disease    Patient denies  . Mitral stenosis   . Myocardial infarction ([place])   . Pneumonia   . Pulmonary edema   . Solitary rectal ulcer syndrome 07/2017   at [facility] for rectal bleeding  . Tubular adenoma of colon     Patient Active Problem List   Diagnosis Date Noted  . ESRD (end stage renal disease) ([place]) 11/21/2017  . GERD (gastroesophageal reflux disease) 11/16/2017  . Liver cirrhosis ([place]) 11/15/2017  . DNR (do  not resuscitate)   . Palliative care by specialist   . Hyponatremia 11/04/2017  . SBP (spontaneous bacterial peritonitis) ([place]) 10/30/2017  . Liver disease, chronic 10/30/2017  . SOB (shortness of breath)   . Abdominal pain 10/28/2017  . Upper airway cough syndrome with flattening on f/v loop 10/13/17 c/w vcd 10/17/2017  . Elevated diaphragm 10/13/2017  . Ileus ([place]) 09/29/2017  . Malnutrition of moderate degree 09/29/2017  . Sinus congestion 09/03/2017  . Symptomatic anemia 09/02/2017  . Other cirrhosis of liver ([place]) 09/02/2017  . Left bundle branch block 09/02/2017  . Mitral stenosis 09/02/2017  . Hematochezia 07/15/2017  . Wide-complex tachycardia ([place])   . Endotracheally intubated   . ESRD on dialysis ([place]) 07/04/2017  . Acute respiratory failure with hypoxia ([place]) 06/18/2017  . CKD (chronic kidney disease) stage V requiring chronic dialysis ([place]) 06/18/2017  . History of Cocaine abuse ([place]) 06/18/2017  . Hypertension 06/18/2017  . Infection of AV graft for dialysis ([place]) 06/18/2017  . Anxiety 06/18/2017  . Anemia due to chronic kidney disease 06/18/2017  . Atrial flutter with rapid ventricular response ([place]) 06/18/2017  . Personality disorder ([place]) 06/13/2017  . Cellulitis 06/12/2017  . Adjustment disorder with mixed anxiety and depressed mood 06/10/2017  . Suicidal ideation 06/10/2017  . Arm wound, left, sequela  06/10/2017  . Dyspnea on exertion 05/29/2017  . Tachycardia 05/29/2017  . Hyperkalemia 05/22/2017  . Acute metabolic encephalopathy   . Anemia 04/23/2017  . Ascites 04/23/2017  . COPD (chronic obstructive pulmonary disease) ([place]) 04/23/2017  . Acute on chronic respiratory failure with hypoxia ([place]) 03/25/2017  . Arrhythmia 03/25/2017  . COPD GOLD 0 with flattening on inps f/v  09/27/2016  . Essential hypertension 09/27/2016  . Fluid overload 08/30/2016  . COPD exacerbation ([place]) 08/17/2016  . Hypertensive urgency 08/17/2016  . Respiratory failure ([place])  08/17/2016  . Problem with dialysis access ([place]) 07/23/2016  . Chronic hepatitis B ([place]) 03/05/2014  . Chronic hepatitis C without hepatic coma ([place]) 03/05/2014  . Internal hemorrhoids with bleeding, swelling and itching 03/05/2014  . Thrombocytopenia ([place]) 03/05/2014  . Chest pain 02/27/2014  . Alcohol abuse 04/14/2009  . Cigarette smoker 04/14/2009  . GANGLION CYST 04/14/2009    Past Surgical History:  Procedure Laterality Date  . A/V FISTULAGRAM Left 05/26/2017   Procedure: A/V FISTULAGRAM;  Surgeon: Conrad Cold Bay, MD;  Location: [facility];  Service: Cardiovascular;  Laterality: Left;  . A/V FISTULAGRAM Right 11/18/2017   Procedure: A/V FISTULAGRAM - Right Arm;  Surgeon: Elam Dutch, MD;  Location: [facility];  Service: Cardiovascular;  Laterality: Right;  . APPLICATION OF WOUND VAC Left 06/14/2017   Procedure: APPLICATION OF WOUND VAC;  Surgeon: Katha Cabal, MD;  Location: [facility];  Service: Vascular;  Laterality: Left;  . AV FISTULA PLACEMENT  2012   BELIEVED WAS PLACED IN JUNE  . AV FISTULA PLACEMENT Right 08/09/2017   Procedure: Creation Right arm ARTERIOVENOUS BRACHIOCEPOHALIC FISTULA;  Surgeon: Elam Dutch, MD;  Location: [facility] OR;  Service: Vascular;  Laterality: Right;  . AV FISTULA PLACEMENT Right 11/22/2017   Procedure: INSERTION OF ARTERIOVENOUS (AV) GORE-TEX GRAFT RIGHT UPPER ARM;  Surgeon: Elam Dutch, MD;  Location: [facility];  Service: Vascular;  Laterality: Right;  . COLONOSCOPY    . CORONARY STENT INTERVENTION N/A 02/22/2017   Procedure: CORONARY STENT INTERVENTION;  Surgeon: Nigel Mormon, MD;  Location: [facility];  Service: Cardiovascular;  Laterality: N/A;  . FLEXIBLE SIGMOIDOSCOPY N/A 07/15/2017   Procedure: FLEXIBLE SIGMOIDOSCOPY;  Surgeon: Carol Ada, MD;  Location: [facility];  Service: Endoscopy;  Laterality: N/A;  . HEMORRHOID BANDING    . I&D EXTREMITY Left 06/01/2017   Procedure: IRRIGATION AND  DEBRIDEMENT LEFT ARM HEMATOMA WITH LIGATION OF LEFT ARM AV FISTULA;  Surgeon: Elam Dutch, MD;  Location: [facility];  Service: Vascular;  Laterality: Left;  . I&D EXTREMITY Left 06/14/2017   Procedure: IRRIGATION AND DEBRIDEMENT EXTREMITY;  Surgeon: Katha Cabal, MD;  Location: [facility];  Service: Vascular;  Laterality: Left;  . INSERTION OF DIALYSIS CATHETER  05/30/2017  . INSERTION OF DIALYSIS CATHETER N/A 05/30/2017   Procedure: INSERTION OF DIALYSIS CATHETER;  Surgeon: Elam Dutch, MD;  Location: [facility];  Service: Vascular;  Laterality: N/A;  . IR PARACENTESIS  08/30/2017  . IR PARACENTESIS  09/29/2017  . IR PARACENTESIS  10/28/2017  . IR PARACENTESIS  11/09/2017  . IR PARACENTESIS  11/16/2017  . IR PARACENTESIS  11/28/2017  . IR PARACENTESIS  12/01/2017  . IR PARACENTESIS  12/06/2017  . LEFT HEART CATH AND CORONARY ANGIOGRAPHY N/A 02/22/2017   Procedure: LEFT HEART CATH AND CORONARY ANGIOGRAPHY;  Surgeon: Nigel Mormon, MD;  Location: [facility];  Service: Cardiovascular;  Laterality: N/A;  . LIGATION OF  ARTERIOVENOUS  FISTULA Left 8/0/9983   Procedure: Plication of Left Arm Arteriovenous Fistula;  Surgeon: Elam Dutch, MD;  Location: [facility];  Service: Vascular;  Laterality: Left;  . POLYPECTOMY    . REVISON OF ARTERIOVENOUS FISTULA Left 3/82/5053   Procedure: PLICATION OF DISTAL ANEURYSMAL SEGEMENT OF LEFT UPPER ARM ARTERIOVENOUS FISTULA;  Surgeon: Elam Dutch, MD;  Location: [facility];  Service: Vascular;  Laterality: Left;  . REVISON OF ARTERIOVENOUS FISTULA Left 9/76/7341   Procedure: Plication of Left Upper Arm Fistula ;  Surgeon: Waynetta Sandy, MD;  Location: [facility];  Service: Vascular;  Laterality: Left;  . SKIN GRAFT SPLIT THICKNESS LEG / FOOT Left    SKIN GRAFT SPLIT THICKNESS LEFT ARM DONOR SITE: LEFT ANTERIOR THIGH  . SKIN SPLIT GRAFT Left 07/04/2017   Procedure: SKIN GRAFT SPLIT THICKNESS LEFT ARM DONOR SITE: LEFT ANTERIOR THIGH;  Surgeon:  Elam Dutch, MD;  Location: [facility];  Service: Vascular;  Laterality: Left;  . THROMBECTOMY W/ EMBOLECTOMY Left 06/05/2017   Procedure: EXPLORATION OF LEFT ARM FOR BLEEDING; OVERSEWED PROXIMAL FISTULA;  Surgeon: Angelia Mould, MD;  Location: [facility];  Service: Vascular;  Laterality: Left;  . WOUND EXPLORATION Left 06/03/2017   Procedure: WOUND EXPLORATION WITH WOUND VAC APPLICATION TO LEFT ARM;  Surgeon: Angelia Mould, MD;  Location: [facility];  Service: Vascular;  Laterality: Left;        Home Medications    Prior to Admission medications   Medication Sig Start Date End Date Taking? Authorizing Provider  albuterol (VENTOLIN HFA) 108 (90 Base) MCG/ACT inhaler Inhale 2 puffs into the lungs every 6 (six) hours as needed for wheezing or shortness of breath.    [provider]  amoxicillin-clavulanate (AUGMENTIN) 875-125 MG tablet Take 2 tablets by mouth 2 (two) times daily. 12/30/17   Recardo Evangelist, PA-C  budesonide-formoterol (SYMBICORT) 160-4.5 MCG/ACT inhaler Inhale 2 puffs into the lungs 2 (two) times daily.    [provider]  ciprofloxacin (CIPRO) 500 MG tablet Take 500 mg by mouth at bedtime.    [provider]  clopidogrel (PLAVIX) 75 MG tablet Take 75 mg by mouth daily.    [provider]  diltiazem (CARDIZEM CD) 120 MG 24 hr capsule Take 1 capsule (120 mg total) by mouth daily. 06/23/17 06/23/18  Deatra James, MD  doxycycline (VIBRAMYCIN) 100 MG capsule Take 1 capsule (100 mg total) by mouth 2 (two) times daily. 12/30/17   Recardo Evangelist, PA-C  famotidine (PEPCID) 20 MG tablet Take 20 mg by mouth daily.    [provider]  Fluticasone-Salmeterol (ADVAIR) 250-50 MCG/DOSE AEPB Inhale 1 puff into the lungs 2 (two) times daily.    [provider]  gabapentin (NEURONTIN) 100 MG capsule Take 100 mg by mouth 2 (two) times daily. 12/13/17   [provider]  hydrALAZINE (APRESOLINE) 100 MG tablet Take 1  tablet by mouth twice a day take after HD on dialysis days 08/31/17   [provider]  isosorbide mononitrate (IMDUR) 30 MG 24 hr tablet Take 1 tablet (30 mg total) by mouth daily. 06/17/17   [name], MD  lactulose, encephalopathy, (CHRONULAC) 10 GM/15ML SOLN Take 20 g by mouth 2 (two) times daily.    [provider]  oxyCODONE (OXY IR/ROXICODONE) 5 MG immediate release tablet Take 5 mg by mouth 2 (two) times daily as needed for pain. 12/13/17   [provider]  pantoprazole (PROTONIX) 40 MG tablet Take 40 mg  by mouth daily.    [provider]  tiotropium (SPIRIVA) 18 MCG inhalation capsule Place 18 mcg into inhaler and inhale daily.    [provider]  traZODone (DESYREL) 150 MG tablet Take 150 mg by mouth at bedtime.    [provider]    Family History Family History  Problem Relation Age of Onset  . Heart disease Mother   . Lung cancer Mother   . Heart disease Father   . Malignant hyperthermia Father   . COPD Father   . Throat cancer Sister   . Esophageal cancer Sister   . Hypertension Other   . COPD Other   . Colon cancer Neg Hx   . Colon polyps Neg Hx   . Rectal cancer Neg Hx   . Stomach cancer Neg Hx     Social History Social History   Tobacco Use  . Smoking status: Current Every Day Smoker    Packs/day: 0.50    Years: 43.00    Pack years: 21.50    Types: Cigarettes    Start date: 08/13/1973  . Smokeless tobacco: Never Used  Substance Use Topics  . Alcohol use: Not Currently    Frequency: Never    Comment: quit drinking in 2017  . Drug use: Not Currently    Comment: quit in 2017"     Allergies   Morphine and related; Aspirin; Clonidine derivatives; Tramadol; and Tylenol [acetaminophen]   Review of Systems Review of Systems  All other systems reviewed and are negative.    Physical Exam Updated Vital Signs BP (!) 202/98   Pulse 90   Resp 18   SpO2 98%   Physical Exam  Constitutional: He is  oriented to person, place, and time. He appears well-developed and well-nourished.  HENT:  Head: Normocephalic and atraumatic.  Eyes: EOM are normal.  Neck: Normal range of motion.  Cardiovascular: Normal rate, regular rhythm, normal heart sounds and intact distal pulses.  Pulmonary/Chest: Effort normal and breath sounds normal. No respiratory distress.  Abdominal: Soft. He exhibits no distension.  Diffuse generalized abdominal tenderness with some focality in the right lower quadrant.  No peritoneal signs.  No guarding or rebound  Musculoskeletal: Normal range of motion.  Neurological: He is alert and oriented to person, place, and time.  Skin: Skin is warm and dry.  Psychiatric: He has a normal mood and affect. Judgment normal.  Nursing note and vitals reviewed.    ED Treatments / Results  Labs (all labs ordered are listed, but only abnormal results are displayed) Labs Reviewed  CBC - Abnormal; Notable for the following components:      Result Value   RBC 3.57 (*)    Hemoglobin 9.1 (*)    HCT 28.0 (*)    MCH 25.5 (*)    RDW 21.2 (*)    Platelets 122 (*)    All other components within normal limits  COMPREHENSIVE METABOLIC PANEL - Abnormal; Notable for the following components:   Chloride 98 (*)    Glucose, Bld 109 (*)    BUN 31 (*)    Creatinine, Ser 9.10 (*)    Albumin 3.4 (*)    ALT 10 (*)    Alkaline Phosphatase 205 (*)    GFR calc non Af Amer 6 (*)    GFR calc Af Amer 7 (*)    All other components within normal limits  LIPASE, BLOOD    EKG None  Radiology Ct Abdomen Pelvis W Contrast  Result  Date: 01/02/2018 CLINICAL DATA:  57 year old male with lower abdominal pain, nausea and diarrhea. EXAM: CT ABDOMEN AND PELVIS WITH CONTRAST TECHNIQUE: Multidetector CT imaging of the abdomen and pelvis was performed using the standard protocol following bolus administration of intravenous contrast. CONTRAST:  134mL OMNIPAQUE IOHEXOL 300 MG/ML  SOLN COMPARISON:  Abdominal CT  dated 12/07/2017 FINDINGS: Lower chest: The visualized lung bases are clear. There is moderate cardiomegaly. Coronary vascular calcification as well as calcification of the aortic valve. No intra-abdominal free air. Moderate ascites, slightly increased since the prior CT. Hepatobiliary: Morphologic changes of cirrhosis. The gallbladder is partially contracted. No calcified gallstone. Pancreas: Unremarkable. No pancreatic ductal dilatation or surrounding inflammatory changes. Spleen: Normal in size without focal abnormality. Adrenals/Urinary Tract: The adrenal glands are unremarkable. Atrophic kidneys. Multiple bilateral renal hypodense lesions which are not characterized. No hydronephrosis. The urinary bladder is collapsed. Stomach/Bowel: There is no bowel obstruction. The appendix is normal as visualized. Vascular/Lymphatic: Advanced aortoiliac atherosclerotic disease. No portal venous gas. No adenopathy. Reproductive: The prostate and seminal vesicles are grossly unremarkable. Other: None Musculoskeletal: Diffuse osseous sclerosis consistent with renal osteodystrophy. No acute fracture. IMPRESSION: 1. Cirrhosis with increased ascites. 2. Chronic renal failure with findings of renal osteodystrophy. 3. No bowel obstruction. 4. Cardiomegaly with coronary vascular calcification. 5.  Aortic Atherosclerosis (ICD10-I70.0). Electronically Signed   By: Anner Crete M.D.   On: 01/02/2018 05:07    Procedures Procedures (including critical care time)  Medications Ordered in ED Medications  fentaNYL (SUBLIMAZE) injection 100 mcg (100 mcg Intravenous Given 01/02/18 0353)  iohexol (OMNIPAQUE) 300 MG/ML solution 100 mL (100 mLs Intravenous Contrast Given 01/02/18 0430)  dicyclomine (BENTYL) capsule 10 mg (10 mg Oral Given 01/02/18 0549)     Initial Impression / Assessment and Plan / ED Course  I have reviewed the triage vital signs and the nursing notes.  Pertinent labs & imaging results that were available  during my care of the patient were reviewed by me and considered in my medical decision making (see chart for details).     CT imaging without pathology.  No fevers at home.  Doubt spontaneous bacterial peritonitis.  Nontoxic.  Patient has been seen in the emergency department 22 times in the past 6 months for similar symptoms.  Outpatient primary care and GI follow-up.  Patient understands return to the emergency department for new or worsening symptoms  Final Clinical Impressions(s) / ED Diagnoses   Final diagnoses:  Acute abdominal pain    ED Discharge Orders    None       Jola Schmidt, MD 01/02/18 [phone]

## 2018-01-02 NOTE — Telephone Encounter (Signed)
The pt has been advised of the prep information and verbalized understanding.

## 2018-01-02 NOTE — ED Triage Notes (Signed)
Pt from home for abd pain. MWF dialysis patient; last treatment on Friday. Reports diarrhea (takes lactulose)

## 2018-01-03 ENCOUNTER — Ambulatory Visit (HOSPITAL_COMMUNITY)
Admission: RE | Admit: 2018-01-03 | Discharge: 2018-01-03 | Disposition: A | Discharge status: Home / Self Care | Payer: Medicare Other | Source: Ambulatory Visit | Attending: Internal Medicine | Admitting: Internal Medicine

## 2018-01-03 ENCOUNTER — Ambulatory Visit (HOSPITAL_COMMUNITY)
Admit: 2018-01-03 | Discharge: 2018-01-03 | Disposition: A | Discharge status: Home / Self Care | Payer: Medicare Other | Source: Ambulatory Visit | Attending: Internal Medicine | Admitting: Internal Medicine

## 2018-01-03 ENCOUNTER — Other Ambulatory Visit: Payer: Self-pay

## 2018-01-03 ENCOUNTER — Ambulatory Visit (HOSPITAL_COMMUNITY): Payer: Medicare Other | Admitting: Anesthesiology

## 2018-01-03 ENCOUNTER — Other Ambulatory Visit: Payer: Self-pay | Admitting: Internal Medicine

## 2018-01-03 ENCOUNTER — Encounter (HOSPITAL_COMMUNITY): Payer: Self-pay | Admitting: Anesthesiology

## 2018-01-03 ENCOUNTER — Encounter (HOSPITAL_COMMUNITY)
Admission: RE | Disposition: A | Discharge status: Home / Self Care | Payer: Self-pay | Source: Ambulatory Visit | Attending: Internal Medicine

## 2018-01-03 DIAGNOSIS — R188 Other ascites: Secondary | ICD-10-CM

## 2018-01-03 DIAGNOSIS — K7460 Unspecified cirrhosis of liver: Secondary | ICD-10-CM

## 2018-01-03 DIAGNOSIS — R1084 Generalized abdominal pain: Secondary | ICD-10-CM | POA: Insufficient documentation

## 2018-01-03 DIAGNOSIS — Z955 Presence of coronary angioplasty implant and graft: Secondary | ICD-10-CM | POA: Insufficient documentation

## 2018-01-03 DIAGNOSIS — Z1211 Encounter for screening for malignant neoplasm of colon: Secondary | ICD-10-CM | POA: Diagnosis not present

## 2018-01-03 DIAGNOSIS — I2510 Atherosclerotic heart disease of native coronary artery without angina pectoris: Secondary | ICD-10-CM | POA: Insufficient documentation

## 2018-01-03 DIAGNOSIS — I10 Essential (primary) hypertension: Secondary | ICD-10-CM | POA: Diagnosis not present

## 2018-01-03 DIAGNOSIS — R8569 Abnormal cytological findings in specimens from other digestive organs and abdominal cavity: Secondary | ICD-10-CM | POA: Diagnosis not present

## 2018-01-03 DIAGNOSIS — Z86010 Personal history of colonic polyps: Secondary | ICD-10-CM | POA: Insufficient documentation

## 2018-01-03 DIAGNOSIS — I252 Old myocardial infarction: Secondary | ICD-10-CM | POA: Insufficient documentation

## 2018-01-03 DIAGNOSIS — Z538 Procedure and treatment not carried out for other reasons: Secondary | ICD-10-CM | POA: Diagnosis not present

## 2018-01-03 DIAGNOSIS — Z13810 Encounter for screening for upper gastrointestinal disorder: Secondary | ICD-10-CM | POA: Diagnosis not present

## 2018-01-03 DIAGNOSIS — F17200 Nicotine dependence, unspecified, uncomplicated: Secondary | ICD-10-CM | POA: Diagnosis not present

## 2018-01-03 DIAGNOSIS — K7030 Alcoholic cirrhosis of liver without ascites: Secondary | ICD-10-CM

## 2018-01-03 HISTORY — PX: IR PARACENTESIS: IMG2679

## 2018-01-03 LAB — GRAM STAIN

## 2018-01-03 LAB — BODY FLUID CELL COUNT WITH DIFFERENTIAL
Eos, Fluid: 1 %
Lymphs, Fluid: 49 %
Monocyte-Macrophage-Serous Fluid: 43 % — ABNORMAL LOW (ref 50–90)
Neutrophil Count, Fluid: 7 % (ref 0–25)
Total Nucleated Cell Count, Fluid: 94 cu mm (ref 0–1000)

## 2018-01-03 SURGERY — CANCELLED PROCEDURE
Anesthesia: Monitor Anesthesia Care

## 2018-01-03 MED ORDER — SODIUM CHLORIDE 0.9 % IV SOLN: INTRAVENOUS | Status: DC

## 2018-01-03 MED ORDER — LIDOCAINE HCL (PF) 2 % IJ SOLN
INTRAMUSCULAR | Status: DC | PRN
  Administered 2018-01-03: 10 mL

## 2018-01-03 MED ORDER — ALBUMIN HUMAN 25 % IV SOLN
INTRAVENOUS | Status: AC
  Administered 2018-01-03: 75 g via INTRAVENOUS
  Filled 2018-01-03: qty 50

## 2018-01-03 MED ORDER — ALBUMIN HUMAN 25 % IV SOLN
INTRAVENOUS | Status: AC
  Filled 2018-01-03: qty 250

## 2018-01-03 MED ORDER — ALBUMIN HUMAN 25 % IV SOLN
75.0000 g | INTRAVENOUS | Status: DC
  Administered 2018-01-03: 75 g via INTRAVENOUS

## 2018-01-03 MED ORDER — LACTATED RINGERS IV SOLN: INTRAVENOUS | Status: DC

## 2018-01-03 MED ORDER — LIDOCAINE HCL (PF) 2 % IJ SOLN
INTRAMUSCULAR | Status: AC
  Filled 2018-01-03: qty 20

## 2018-01-03 SURGICAL SUPPLY — 25 items

## 2018-01-03 NOTE — Anesthesia Preprocedure Evaluation (Addendum)
Anesthesia Evaluation  Patient identified by MRN, date of birth, ID band Patient awake    Reviewed: Allergy & Precautions, NPO status , Patient's Chart, lab work & pertinent test results  Airway Mallampati: II  TM Distance: >3 FB Neck ROM: Full    Dental no notable dental hx.    Pulmonary COPD, Current Smoker,    Pulmonary exam normal breath sounds clear to auscultation       Cardiovascular hypertension, + CAD, + Past MI and + Cardiac Stents  + Valvular Problems/Murmurs  Rhythm:Regular Rate:Normal + Diastolic murmurs Severe mitral stenosis by mean gradient. However, calculated   valve area indicates mild stenosis. The leaflets are restricted   but there does not appear to be severe stenosis. EF 55%   Neuro/Psych negative neurological ROS  negative psych ROS   GI/Hepatic negative GI ROS, (+) Cirrhosis     substance abuse  alcohol use and cocaine use, Hepatitis -, B  Endo/Other  negative endocrine ROS  Renal/GU DialysisRenal disease  negative genitourinary   Musculoskeletal negative musculoskeletal ROS (+)   Abdominal   Peds negative pediatric ROS (+)  Hematology negative hematology ROS (+)   Anesthesia Other Findings   Reproductive/Obstetrics negative OB ROS                             Anesthesia Physical Anesthesia Plan  ASA: IV  Anesthesia Plan: MAC   Post-op Pain Management:    Induction: Intravenous  PONV Risk Score and Plan: 0  Airway Management Planned: Simple Face Mask  Additional Equipment:   Intra-op Plan:   Post-operative Plan:   Informed Consent: I have reviewed the patients History and Physical, chart, labs and discussed the procedure including the risks, benefits and alternatives for the proposed anesthesia with the patient or authorized representative who has indicated his/her understanding and acceptance.   Dental advisory given  Plan Discussed with:  CRNA and Surgeon  Anesthesia Plan Comments:         Anesthesia Quick Evaluation

## 2018-01-03 NOTE — Procedures (Signed)
Pre Procedural Dx: Symptomatic Ascites Post Procedural Dx: Same  Successful US guided paracentesis yielding 2.1 L of serous ascitic fluid. Sample sent to laboratory as requested.  EBL: None  Complications: None immediate  Jay Donnelle Rubey, MD Pager #: [phone]   

## 2018-01-03 NOTE — Progress Notes (Signed)
Patient cancelled for procedures today per Dr. Hilarie Fredrickson.  Patient sent to [facility] for paracentesis this afternoon.    Vista Lawman, RN

## 2018-01-03 NOTE — H&P (Signed)
  Mr. Sporer presented today for upper endoscopy for variceal screening and colonoscopy for history of polyps.  He was seen in the office recently by Nicoletta Ba, PA-C. In the last 4 days he has had 2 visits to the ER.  On 12/30/2017 he was seen in the ER and diagnosed with a possible early left lower lobe pneumonia and treated with antibiotics.  Then on 23 June he was in the ER with abdominal pain.  CT scan was performed which was largely unremarkable but showed increasing ascites.  Today he reports he could not tolerate the bowel prep due to diffuse abdominal pain.  Did drink a portion of the prep but stools while loose remain mostly solid.  He is alert and oriented.  Sclera are icteric.  Borderline tachycardic but regular.  Lungs are clear.  Abdomen is soft but distended with ascites and diffusely tender.  Bowel sounds are positive.  Given that he is being treated for pneumonia, did not complete his bowel preparation I do not think we should proceed with upper endoscopy or colonoscopy.  He does need both of these procedures which we will attempt to reschedule.  I am going to arrange for large volume paracentesis with IV albumin.  We need to send the ascitic fluid for cell count, culture and cytology.  Hopefully hemodialysis can help with increasing abdominal ascites as diuretics or not possible in this case.

## 2018-01-03 NOTE — Transfer of Care (Signed)
Case cancelled

## 2018-01-03 NOTE — Progress Notes (Signed)
Case cancelled secondary to worsening ascites

## 2018-01-04 DIAGNOSIS — N186 End stage renal disease: Secondary | ICD-10-CM | POA: Diagnosis not present

## 2018-01-04 DIAGNOSIS — N2581 Secondary hyperparathyroidism of renal origin: Secondary | ICD-10-CM | POA: Diagnosis not present

## 2018-01-04 DIAGNOSIS — D509 Iron deficiency anemia, unspecified: Secondary | ICD-10-CM | POA: Diagnosis not present

## 2018-01-04 DIAGNOSIS — D631 Anemia in chronic kidney disease: Secondary | ICD-10-CM | POA: Diagnosis not present

## 2018-01-06 DIAGNOSIS — N186 End stage renal disease: Secondary | ICD-10-CM | POA: Diagnosis not present

## 2018-01-06 DIAGNOSIS — N2581 Secondary hyperparathyroidism of renal origin: Secondary | ICD-10-CM | POA: Diagnosis not present

## 2018-01-06 DIAGNOSIS — D631 Anemia in chronic kidney disease: Secondary | ICD-10-CM | POA: Diagnosis not present

## 2018-01-06 DIAGNOSIS — D509 Iron deficiency anemia, unspecified: Secondary | ICD-10-CM | POA: Diagnosis not present

## 2018-01-08 LAB — CULTURE, BODY FLUID W GRAM STAIN -BOTTLE: Culture: NO GROWTH

## 2018-01-09 DIAGNOSIS — N186 End stage renal disease: Secondary | ICD-10-CM | POA: Diagnosis not present

## 2018-01-09 DIAGNOSIS — I120 Hypertensive chronic kidney disease with stage 5 chronic kidney disease or end stage renal disease: Secondary | ICD-10-CM | POA: Diagnosis not present

## 2018-01-09 DIAGNOSIS — Z992 Dependence on renal dialysis: Secondary | ICD-10-CM | POA: Diagnosis not present

## 2018-01-09 DIAGNOSIS — D631 Anemia in chronic kidney disease: Secondary | ICD-10-CM | POA: Diagnosis not present

## 2018-01-09 DIAGNOSIS — D509 Iron deficiency anemia, unspecified: Secondary | ICD-10-CM | POA: Diagnosis not present

## 2018-01-09 DIAGNOSIS — N2581 Secondary hyperparathyroidism of renal origin: Secondary | ICD-10-CM | POA: Diagnosis not present

## 2018-01-10 DIAGNOSIS — Z9119 Patient's noncompliance with other medical treatment and regimen: Secondary | ICD-10-CM | POA: Diagnosis not present

## 2018-01-10 DIAGNOSIS — M79602 Pain in left arm: Secondary | ICD-10-CM | POA: Diagnosis not present

## 2018-01-10 DIAGNOSIS — G8929 Other chronic pain: Secondary | ICD-10-CM | POA: Diagnosis not present

## 2018-01-10 IMAGING — CR DG CHEST 2V
2 series · 2 of 2 positions shown · non-contrast
Comparison: Chest radiograph June 24, 2016

CLINICAL DATA: Shortness of breath for 3 hours, abdominal pain and
distension. History of end-stage renal disease on dialysis,
hypertension, hepatitis.

EXAM:
CHEST  2 VIEW

[chest pa]
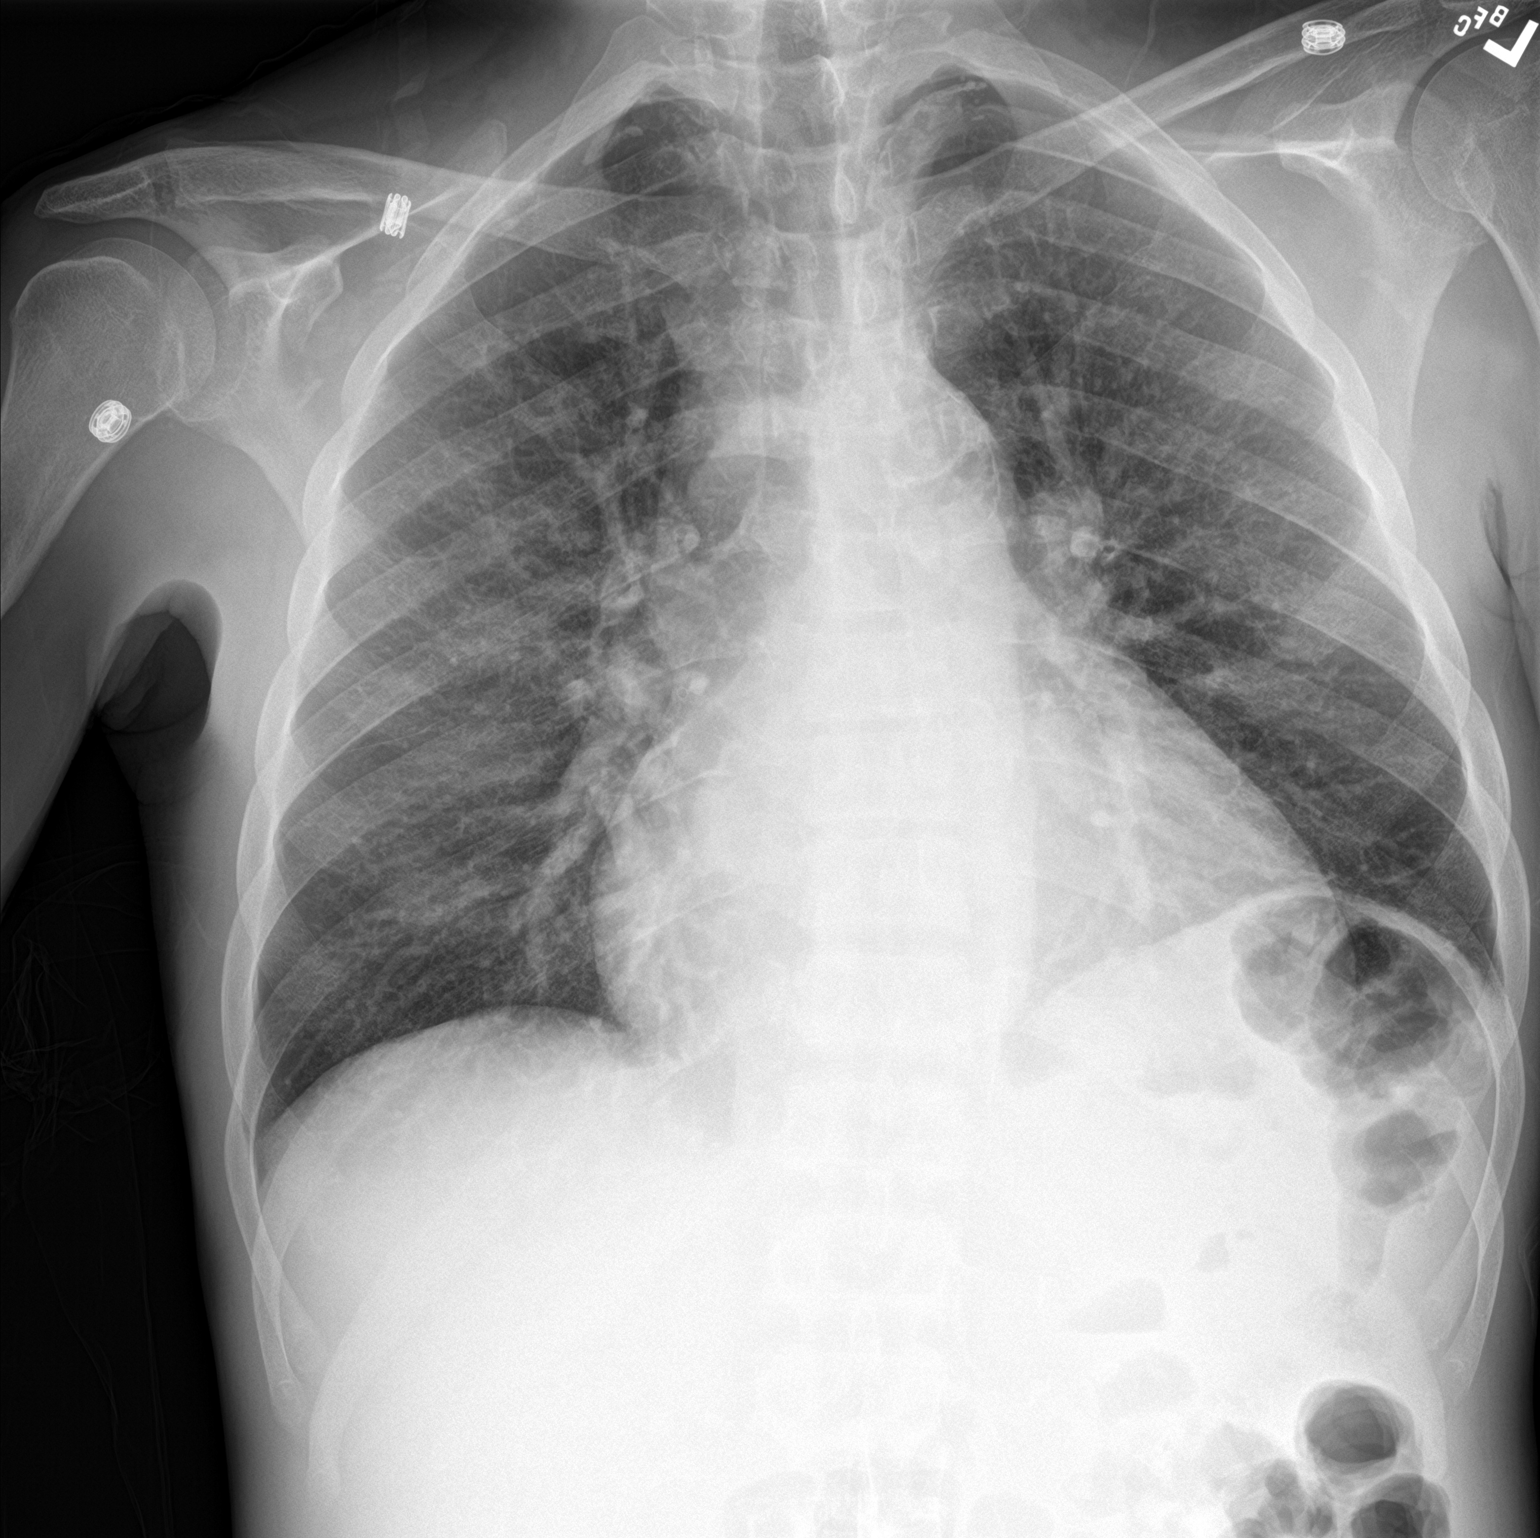

[chest lat]
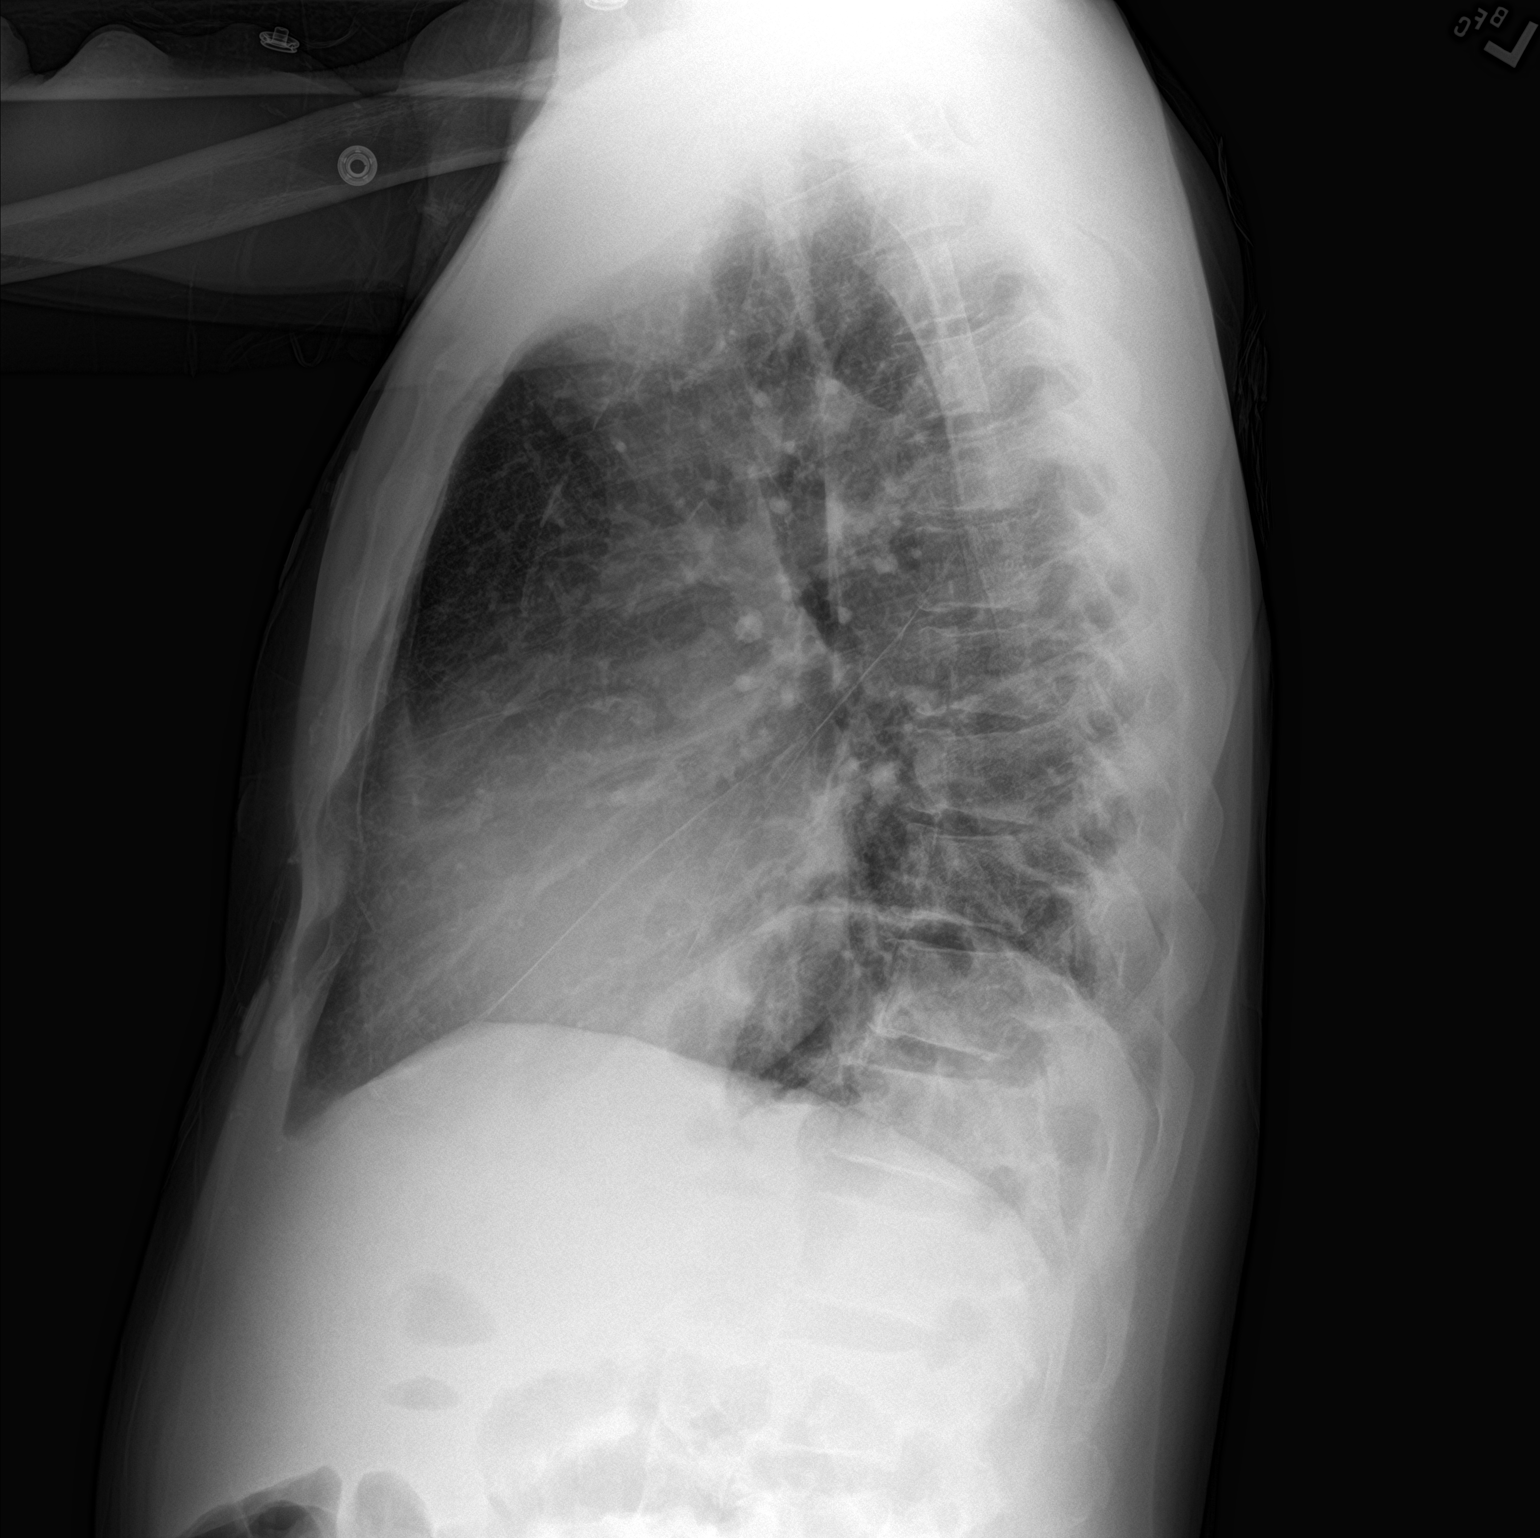

[2 of 2 positions shown; findings below may reference images not displayed]

FINDINGS: Cardiac silhouette is moderately enlarged. Calcified aortic knob.
Pulmonary vascular congestion without pleural effusion or focal
consolidation strandy densities LEFT lung base. No pneumothorax.
Elevated LEFT hemidiaphragm. Soft tissue planes and included osseous
structures are nonsuspicious. Stable mild wedging of mid to lower
thoracic vertebral bodies.
IMPRESSION: Stable cardiomegaly and pulmonary vascular congestion.

LEFT lung base atelectasis/scarring.

## 2018-01-11 DIAGNOSIS — D509 Iron deficiency anemia, unspecified: Secondary | ICD-10-CM | POA: Diagnosis not present

## 2018-01-11 DIAGNOSIS — N2581 Secondary hyperparathyroidism of renal origin: Secondary | ICD-10-CM | POA: Diagnosis not present

## 2018-01-11 DIAGNOSIS — D631 Anemia in chronic kidney disease: Secondary | ICD-10-CM | POA: Diagnosis not present

## 2018-01-11 DIAGNOSIS — N186 End stage renal disease: Secondary | ICD-10-CM | POA: Diagnosis not present

## 2018-01-11 LAB — ACID FAST CULTURE WITH REFLEXED SENSITIVITIES (MYCOBACTERIA): Acid Fast Culture: NEGATIVE

## 2018-01-13 ENCOUNTER — Telehealth: Payer: Self-pay | Admitting: *Deleted

## 2018-01-13 DIAGNOSIS — N2581 Secondary hyperparathyroidism of renal origin: Secondary | ICD-10-CM | POA: Diagnosis not present

## 2018-01-13 DIAGNOSIS — N186 End stage renal disease: Secondary | ICD-10-CM | POA: Diagnosis not present

## 2018-01-13 DIAGNOSIS — D631 Anemia in chronic kidney disease: Secondary | ICD-10-CM | POA: Diagnosis not present

## 2018-01-13 DIAGNOSIS — D509 Iron deficiency anemia, unspecified: Secondary | ICD-10-CM | POA: Diagnosis not present

## 2018-01-13 NOTE — Telephone Encounter (Signed)
error 

## 2018-01-16 DIAGNOSIS — D631 Anemia in chronic kidney disease: Secondary | ICD-10-CM | POA: Diagnosis not present

## 2018-01-16 DIAGNOSIS — D509 Iron deficiency anemia, unspecified: Secondary | ICD-10-CM | POA: Diagnosis not present

## 2018-01-16 DIAGNOSIS — N2581 Secondary hyperparathyroidism of renal origin: Secondary | ICD-10-CM | POA: Diagnosis not present

## 2018-01-16 DIAGNOSIS — N186 End stage renal disease: Secondary | ICD-10-CM | POA: Diagnosis not present

## 2018-01-17 ENCOUNTER — Other Ambulatory Visit: Payer: Self-pay | Admitting: *Deleted

## 2018-01-17 ENCOUNTER — Encounter: Payer: Self-pay | Admitting: Gastroenterology

## 2018-01-17 ENCOUNTER — Ambulatory Visit (INDEPENDENT_AMBULATORY_CARE_PROVIDER_SITE_OTHER): Payer: Medicare Other | Admitting: Gastroenterology

## 2018-01-17 VITALS — BP 108/50 | HR 64 | Ht 69.0 in | Wt 157.2 lb

## 2018-01-17 DIAGNOSIS — K7030 Alcoholic cirrhosis of liver without ascites: Secondary | ICD-10-CM | POA: Diagnosis not present

## 2018-01-17 DIAGNOSIS — Z86010 Personal history of colonic polyps: Secondary | ICD-10-CM

## 2018-01-17 MED ORDER — LACTULOSE ENCEPHALOPATHY 10 GM/15ML PO SOLN: ORAL | 6 refills | Status: DC

## 2018-01-17 NOTE — Patient Instructions (Addendum)
You have been scheduled for an endoscopy and colonoscopy. Please follow the written instructions given to you at your visit today. Please pick up your prep supplies at the pharmacy within the next 1-3 days. If you use inhalers (even only as needed), please bring them with you on the day of your procedure. Your physician has requested that you go to [email or web address] and enter the access code given to you at your visit today. This web site gives a general overview about your procedure. However, you should still follow specific instructions given to you by our office regarding your preparation for the procedure.  

## 2018-01-17 NOTE — Progress Notes (Addendum)
01/17/2018 [name] [id number] 02/17/1961   HISTORY OF PRESENT ILLNESS:  This is a 57 year old African-American male, known to Dr. Hilarie Fredrickson who comes in today for follow-up of decompensated cirrhosis secondary to EtOH and hep C, which was complicated by recurrent SBP and now he is on cipro 500 mg PO daily.  Patient also has other significant comorbidities with end-stage renal disease on dialysis Monday Wednesday and Friday, history of hepatic encephalopathy, hypertension, COPD, atrial fib flutter, prior history of substance abuse/cocaine and personality disorder. Has history of multiple adenomatous colon polyps with last colonoscopy done in March 2014. Last EGD in May 2018 showed no evidence of esophageal varices or portal gastropathy. Had sigmoidoscopy in January 2019 for rectal bleeding and was found to have multiple rectal ulcers consistent with solitary rectal ulcer syndrome.  Recently was scheduled for EGD and colonoscopy but was not able to complete prep, had been diagnosed with PNA, and had large amount of ascites when he came for his procedures.  Had paracentesis on 6/25 with 2.1 Liters removed, negative for SBP.  PNA improved/resolved.  He was previously on Plavix, but states that he has been off of that since his new AV graft, which was in May--unsure if he is supposed to resume this at some point.  Past Medical History:  Diagnosis Date  . Anemia   . Anxiety   . Arthritis    left shoulder  . Atherosclerosis of aorta ([place])   . Cardiomegaly   . Chest pain    DATE UNKNOWN, C/O PERIODICALLY  . Cocaine abuse ([place])   . COPD exacerbation ([place]) 08/17/2016  . Coronary artery disease    stent 02/22/17  . ESRD (end stage renal disease) on dialysis ([place])    "[name]; MWF" (07/04/2017)  . GERD (gastroesophageal reflux disease)    DATE UNKNOWN  . Hemorrhoids   . Hepatitis B, chronic ([place])   . Hepatitis C   . History of kidney stones   . Hyperkalemia   . Hypertension    . Metabolic bone disease    Patient denies  . Mitral stenosis   . Myocardial infarction ([place])   . Pneumonia   . Pulmonary edema   . Solitary rectal ulcer syndrome 07/2017   at [facility] for rectal bleeding  . Tubular adenoma of colon    Past Surgical History:  Procedure Laterality Date  . A/V FISTULAGRAM Left 05/26/2017   Procedure: A/V FISTULAGRAM;  Surgeon: Conrad Vanderbilt, MD;  Location: [facility];  Service: Cardiovascular;  Laterality: Left;  . A/V FISTULAGRAM Right 11/18/2017   Procedure: A/V FISTULAGRAM - Right Arm;  Surgeon: Elam Dutch, MD;  Location: [facility];  Service: Cardiovascular;  Laterality: Right;  . APPLICATION OF WOUND VAC Left 06/14/2017   Procedure: APPLICATION OF WOUND VAC;  Surgeon: Katha Cabal, MD;  Location: [facility];  Service: Vascular;  Laterality: Left;  . AV FISTULA PLACEMENT  2012   BELIEVED WAS PLACED IN JUNE  . AV FISTULA PLACEMENT Right 08/09/2017   Procedure: Creation Right arm ARTERIOVENOUS BRACHIOCEPOHALIC FISTULA;  Surgeon: Elam Dutch, MD;  [facility];  Service: Vascular;  Laterality: Right;  . AV FISTULA PLACEMENT Right 11/22/2017   Procedure: INSERTION OF ARTERIOVENOUS (AV) GORE-TEX GRAFT RIGHT UPPER ARM;  Surgeon: Elam Dutch, MD;  Location: [facility];  Service: Vascular;  Laterality: Right;  . COLONOSCOPY    . CORONARY STENT INTERVENTION N/A 02/22/2017   Procedure: CORONARY STENT  INTERVENTION;  Surgeon: Nigel Mormon, MD;  Location: [facility];  Service: Cardiovascular;  Laterality: N/A;  . FLEXIBLE SIGMOIDOSCOPY N/A 07/15/2017   Procedure: FLEXIBLE SIGMOIDOSCOPY;  Surgeon: Carol Ada, MD;  Location: [facility];  Service: Endoscopy;  Laterality: N/A;  . HEMORRHOID BANDING    . I&D EXTREMITY Left 06/01/2017   Procedure: IRRIGATION AND DEBRIDEMENT LEFT ARM HEMATOMA WITH LIGATION OF LEFT ARM AV FISTULA;  Surgeon: Elam Dutch, MD;  Location: [place] [facility];  Service: Vascular;  Laterality: Left;   . I&D EXTREMITY Left 06/14/2017   Procedure: IRRIGATION AND DEBRIDEMENT EXTREMITY;  Surgeon: Katha Cabal, MD;  Location: [facility];  Service: Vascular;  Laterality: Left;  . INSERTION OF DIALYSIS CATHETER  05/30/2017  . INSERTION OF DIALYSIS CATHETER N/A 05/30/2017   Procedure: INSERTION OF DIALYSIS CATHETER;  Surgeon: Elam Dutch, MD;  Location: [facility];  Service: Vascular;  Laterality: N/A;  . IR PARACENTESIS  08/30/2017  . IR PARACENTESIS  09/29/2017  . IR PARACENTESIS  10/28/2017  . IR PARACENTESIS  11/09/2017  . IR PARACENTESIS  11/16/2017  . IR PARACENTESIS  11/28/2017  . IR PARACENTESIS  12/01/2017  . IR PARACENTESIS  12/06/2017  . IR PARACENTESIS  01/03/2018  . LEFT HEART CATH AND CORONARY ANGIOGRAPHY N/A 02/22/2017   Procedure: LEFT HEART CATH AND CORONARY ANGIOGRAPHY;  Surgeon: Nigel Mormon, MD;  Location: [facility];  Service: Cardiovascular;  Laterality: N/A;  . LIGATION OF ARTERIOVENOUS  FISTULA Left 4/0/9811   Procedure: Plication of Left Arm Arteriovenous Fistula;  Surgeon: Elam Dutch, MD;  Location: [facility];  Service: Vascular;  Laterality: Left;  . POLYPECTOMY    . REVISON OF ARTERIOVENOUS FISTULA Left 03/25/7828   Procedure: PLICATION OF DISTAL ANEURYSMAL SEGEMENT OF LEFT UPPER ARM ARTERIOVENOUS FISTULA;  Surgeon: Elam Dutch, MD;  Location: [facility];  Service: Vascular;  Laterality: Left;  . REVISON OF ARTERIOVENOUS FISTULA Left 5/62/1308   Procedure: Plication of Left Upper Arm Fistula ;  Surgeon: Waynetta Sandy, MD;  Location: [facility] [facility];  Service: Vascular;  Laterality: Left;  . SKIN GRAFT SPLIT THICKNESS LEG / FOOT Left    SKIN GRAFT SPLIT THICKNESS LEFT ARM DONOR SITE: LEFT ANTERIOR THIGH  . SKIN SPLIT GRAFT Left 07/04/2017   Procedure: SKIN GRAFT SPLIT THICKNESS LEFT ARM DONOR SITE: LEFT ANTERIOR THIGH;  Surgeon: Elam Dutch, MD;  Location: [facility];  Service: Vascular;  Laterality: Left;  . THROMBECTOMY W/ EMBOLECTOMY Left 06/05/2017    Procedure: EXPLORATION OF LEFT ARM FOR BLEEDING; OVERSEWED PROXIMAL FISTULA;  Surgeon: Angelia Mould, MD;  Location: [facility];  Service: Vascular;  Laterality: Left;  . WOUND EXPLORATION Left 06/03/2017   Procedure: WOUND EXPLORATION WITH WOUND VAC APPLICATION TO LEFT ARM;  Surgeon: Angelia Mould, MD;  Location: [facility];  Service: Vascular;  Laterality: Left;    reports that he has been smoking cigarettes.  He started smoking about 44 years ago. He has a 21.50 pack-year smoking history. He has never used smokeless tobacco. He reports that he drank alcohol. He reports that he has current or past drug history. Drug: Marijuana. family history includes COPD in his father and other; Esophageal cancer in his sister; Heart disease in his father and mother; Hypertension in his other; Lung cancer in his mother; Malignant hyperthermia in his father; Throat cancer in his sister. Allergies  Allergen Reactions  . Morphine And Related Other (See Comments)    Stomach pain  . Aspirin  Other (See Comments)    STOMACH PAIN  . Clonidine Derivatives Itching  . Tramadol Itching  . Tylenol [Acetaminophen] Nausea Only    Stomach ache      Outpatient Encounter Medications as of 01/17/2018  Medication Sig  . albuterol (VENTOLIN HFA) 108 (90 Base) MCG/ACT inhaler Inhale 2 puffs into the lungs every 6 (six) hours as needed for wheezing or shortness of breath.  . budesonide-formoterol (SYMBICORT) 160-4.5 MCG/ACT inhaler Inhale 2 puffs into the lungs 2 (two) times daily.  . ciprofloxacin (CIPRO) 500 MG tablet Take 500 mg by mouth at bedtime.  Marland Kitchen diltiazem (CARDIZEM CD) 120 MG 24 hr capsule Take 1 capsule (120 mg total) by mouth daily.  Marland Kitchen gabapentin (NEURONTIN) 100 MG capsule Take 100 mg by mouth 2 (two) times daily.  . hydrALAZINE (APRESOLINE) 100 MG tablet Take 1 tablet by mouth twice a day take after HD on dialysis days  . oxyCODONE (OXY IR/ROXICODONE) 5 MG immediate release tablet Take 5 mg by mouth 2  (two) times daily as needed for pain.  . sevelamer carbonate (RENVELA) 800 MG tablet Take 4 tablets by mouth 2 (two) times daily with a meal. And 2 tablets twice daily with snacks  . clopidogrel (PLAVIX) 75 MG tablet Take 75 mg by mouth daily.  . Fluticasone-Salmeterol (ADVAIR) 250-50 MCG/DOSE AEPB Inhale 1 puff into the lungs 2 (two) times daily.  Marland Kitchen lactulose, encephalopathy, (CHRONULAC) 10 GM/15ML SOLN Take 15 ml's by mouth twice daily. (Patient not taking: Reported on 01/17/2018)  . [DISCONTINUED] amoxicillin-clavulanate (AUGMENTIN) 875-125 MG tablet Take 2 tablets by mouth 2 (two) times daily.  . [DISCONTINUED] ciprofloxacin (CIPRO) 500 MG tablet Take 500 mg by mouth at bedtime.  . [DISCONTINUED] doxycycline (VIBRAMYCIN) 100 MG capsule Take 1 capsule (100 mg total) by mouth 2 (two) times daily.  . [DISCONTINUED] famotidine (PEPCID) 20 MG tablet Take 20 mg by mouth daily.  . [DISCONTINUED] isosorbide mononitrate (IMDUR) 30 MG 24 hr tablet Take 1 tablet (30 mg total) by mouth daily.  . [DISCONTINUED] pantoprazole (PROTONIX) 40 MG tablet Take 40 mg by mouth daily.  . [DISCONTINUED] tiotropium (SPIRIVA) 18 MCG inhalation capsule Place 18 mcg into inhaler and inhale daily.  . [DISCONTINUED] traZODone (DESYREL) 150 MG tablet Take 150 mg by mouth at bedtime.   No facility-administered encounter medications on file as of 01/17/2018.      REVIEW OF SYSTEMS  : All other systems reviewed and negative except where noted in the History of Present Illness.   PHYSICAL EXAM: BP (!) 108/50   Pulse 64 Comment: slightly irregular  Ht 5\' 9"  (1.753 m)   Wt 157 lb 3.2 oz (71.3 kg)   BMI 23.21 kg/m  General: Well developed black male in no acute distress Head: Normocephalic and atraumatic Eyes:  Sclerae anicteric, conjunctiva pink. Ears: Normal auditory acuity Lungs: Clear throughout to auscultation; no increased WOB. Heart: Regular rate and rhythm; no M/R/G. Abdomen: Soft, slightly distended with ascites  fluid.  BS present.  Non-tender. Rectal:  Will be done at the time of colonoscopy. Musculoskeletal: Symmetrical with no gross deformities  Skin: No lesions on visible extremities Extremities: No edema  Neurological: Alert oriented x 4, grossly non-focal Psychological:  Alert and cooperative. Normal mood and affect  ASSESSMENT AND PLAN: #50 57 year old African-American male with decompensated cirrhosis complicated by history of hepatic encephalopathy, and recurrent SBP.  He is currently on prophylaxis with Cipro 500 mg p.o. Daily.  #2 end-stage renal disease on dialysis #3 COPD #4 history of  atrial fib/flutter #5 prior history of substance abuse #6 personality disorder #7 history of multiple adenomatous and hyperplastic colon polyps due for follow-up colonoscopy #8 history of GI bleeding secondary to rectal ulcers January 2019   *Recently was scheduled for EGD and colonoscopy but was not able to complete prep, had been diagnosed with PNA, and had large amount of ascites when he came for his procedures.  Had paracentesis on 6/25 with 2.1 Liters removed, negative for SBP.  PNA improved/resolved.  Will be rescheduled for colonoscopy and EGD to assess for esophageal varices with Dr. Hilarie Fredrickson.  Procedures have been scheduled at [facility] for next week as he is currently off of his Plavix medication and unsure whether or not this will be resumed.  Indications risks and benefits were discussed in detail with the patient and he is agreeable to proceed.    CC:  [name], MD   Addendum: Reviewed and agree with management. Pyrtle, Lajuan Lines, MD

## 2018-01-18 DIAGNOSIS — N186 End stage renal disease: Secondary | ICD-10-CM | POA: Diagnosis not present

## 2018-01-18 DIAGNOSIS — N2581 Secondary hyperparathyroidism of renal origin: Secondary | ICD-10-CM | POA: Diagnosis not present

## 2018-01-18 DIAGNOSIS — D509 Iron deficiency anemia, unspecified: Secondary | ICD-10-CM | POA: Diagnosis not present

## 2018-01-18 DIAGNOSIS — D631 Anemia in chronic kidney disease: Secondary | ICD-10-CM | POA: Diagnosis not present

## 2018-01-20 ENCOUNTER — Other Ambulatory Visit: Payer: Self-pay

## 2018-01-20 ENCOUNTER — Encounter: Payer: Self-pay | Admitting: Gastroenterology

## 2018-01-20 ENCOUNTER — Inpatient Hospital Stay (HOSPITAL_COMMUNITY)
Admission: EM | Admit: 2018-01-20 | Discharge: 2018-01-22 | DRG: 432 | Disposition: A | Discharge status: Home / Self Care | Payer: Medicare Other | Attending: Family Medicine | Admitting: Family Medicine

## 2018-01-20 ENCOUNTER — Encounter (HOSPITAL_COMMUNITY): Payer: Self-pay | Admitting: Emergency Medicine

## 2018-01-20 DIAGNOSIS — K7210 Chronic hepatic failure without coma: Secondary | ICD-10-CM | POA: Diagnosis present

## 2018-01-20 DIAGNOSIS — N186 End stage renal disease: Secondary | ICD-10-CM | POA: Diagnosis not present

## 2018-01-20 DIAGNOSIS — K7031 Alcoholic cirrhosis of liver with ascites: Principal | ICD-10-CM | POA: Diagnosis present

## 2018-01-20 DIAGNOSIS — I120 Hypertensive chronic kidney disease with stage 5 chronic kidney disease or end stage renal disease: Secondary | ICD-10-CM | POA: Diagnosis not present

## 2018-01-20 DIAGNOSIS — N189 Chronic kidney disease, unspecified: Secondary | ICD-10-CM | POA: Diagnosis present

## 2018-01-20 DIAGNOSIS — Z992 Dependence on renal dialysis: Secondary | ICD-10-CM

## 2018-01-20 DIAGNOSIS — R188 Other ascites: Secondary | ICD-10-CM

## 2018-01-20 DIAGNOSIS — Z8719 Personal history of other diseases of the digestive system: Secondary | ICD-10-CM

## 2018-01-20 DIAGNOSIS — M19012 Primary osteoarthritis, left shoulder: Secondary | ICD-10-CM | POA: Diagnosis present

## 2018-01-20 DIAGNOSIS — K652 Spontaneous bacterial peritonitis: Secondary | ICD-10-CM | POA: Diagnosis not present

## 2018-01-20 DIAGNOSIS — R52 Pain, unspecified: Secondary | ICD-10-CM | POA: Diagnosis not present

## 2018-01-20 DIAGNOSIS — Z792 Long term (current) use of antibiotics: Secondary | ICD-10-CM

## 2018-01-20 DIAGNOSIS — J449 Chronic obstructive pulmonary disease, unspecified: Secondary | ICD-10-CM | POA: Diagnosis present

## 2018-01-20 DIAGNOSIS — R109 Unspecified abdominal pain: Secondary | ICD-10-CM | POA: Diagnosis present

## 2018-01-20 DIAGNOSIS — I252 Old myocardial infarction: Secondary | ICD-10-CM

## 2018-01-20 DIAGNOSIS — K7460 Unspecified cirrhosis of liver: Secondary | ICD-10-CM | POA: Diagnosis present

## 2018-01-20 DIAGNOSIS — I10 Essential (primary) hypertension: Secondary | ICD-10-CM | POA: Diagnosis present

## 2018-01-20 DIAGNOSIS — I4891 Unspecified atrial fibrillation: Secondary | ICD-10-CM | POA: Diagnosis present

## 2018-01-20 DIAGNOSIS — Z888 Allergy status to other drugs, medicaments and biological substances status: Secondary | ICD-10-CM

## 2018-01-20 DIAGNOSIS — D696 Thrombocytopenia, unspecified: Secondary | ICD-10-CM | POA: Diagnosis present

## 2018-01-20 DIAGNOSIS — Z885 Allergy status to narcotic agent status: Secondary | ICD-10-CM

## 2018-01-20 DIAGNOSIS — Z7951 Long term (current) use of inhaled steroids: Secondary | ICD-10-CM

## 2018-01-20 DIAGNOSIS — B181 Chronic viral hepatitis B without delta-agent: Secondary | ICD-10-CM | POA: Diagnosis present

## 2018-01-20 DIAGNOSIS — Z8659 Personal history of other mental and behavioral disorders: Secondary | ICD-10-CM

## 2018-01-20 DIAGNOSIS — R1084 Generalized abdominal pain: Secondary | ICD-10-CM

## 2018-01-20 DIAGNOSIS — D509 Iron deficiency anemia, unspecified: Secondary | ICD-10-CM | POA: Diagnosis not present

## 2018-01-20 DIAGNOSIS — F1011 Alcohol abuse, in remission: Secondary | ICD-10-CM | POA: Diagnosis present

## 2018-01-20 DIAGNOSIS — I4892 Unspecified atrial flutter: Secondary | ICD-10-CM | POA: Diagnosis present

## 2018-01-20 DIAGNOSIS — D631 Anemia in chronic kidney disease: Secondary | ICD-10-CM | POA: Diagnosis present

## 2018-01-20 DIAGNOSIS — I2510 Atherosclerotic heart disease of native coronary artery without angina pectoris: Secondary | ICD-10-CM | POA: Diagnosis present

## 2018-01-20 DIAGNOSIS — I700 Atherosclerosis of aorta: Secondary | ICD-10-CM | POA: Diagnosis present

## 2018-01-20 DIAGNOSIS — I484 Atypical atrial flutter: Secondary | ICD-10-CM | POA: Diagnosis present

## 2018-01-20 DIAGNOSIS — Z955 Presence of coronary angioplasty implant and graft: Secondary | ICD-10-CM

## 2018-01-20 DIAGNOSIS — K7290 Hepatic failure, unspecified without coma: Secondary | ICD-10-CM | POA: Diagnosis present

## 2018-01-20 DIAGNOSIS — Z7902 Long term (current) use of antithrombotics/antiplatelets: Secondary | ICD-10-CM

## 2018-01-20 DIAGNOSIS — K219 Gastro-esophageal reflux disease without esophagitis: Secondary | ICD-10-CM | POA: Diagnosis present

## 2018-01-20 DIAGNOSIS — R402413 Glasgow coma scale score 13-15, at hospital admission: Secondary | ICD-10-CM | POA: Diagnosis present

## 2018-01-20 DIAGNOSIS — F1410 Cocaine abuse, uncomplicated: Secondary | ICD-10-CM | POA: Diagnosis present

## 2018-01-20 DIAGNOSIS — Z886 Allergy status to analgesic agent status: Secondary | ICD-10-CM

## 2018-01-20 DIAGNOSIS — Z8619 Personal history of other infectious and parasitic diseases: Secondary | ICD-10-CM

## 2018-01-20 DIAGNOSIS — Z86010 Personal history of colonic polyps: Secondary | ICD-10-CM | POA: Insufficient documentation

## 2018-01-20 DIAGNOSIS — R001 Bradycardia, unspecified: Secondary | ICD-10-CM | POA: Diagnosis not present

## 2018-01-20 DIAGNOSIS — N2581 Secondary hyperparathyroidism of renal origin: Secondary | ICD-10-CM | POA: Diagnosis not present

## 2018-01-20 DIAGNOSIS — F17210 Nicotine dependence, cigarettes, uncomplicated: Secondary | ICD-10-CM | POA: Diagnosis present

## 2018-01-20 DIAGNOSIS — I050 Rheumatic mitral stenosis: Secondary | ICD-10-CM | POA: Diagnosis present

## 2018-01-20 DIAGNOSIS — Z79899 Other long term (current) drug therapy: Secondary | ICD-10-CM

## 2018-01-20 DIAGNOSIS — E8889 Other specified metabolic disorders: Secondary | ICD-10-CM | POA: Diagnosis present

## 2018-01-20 LAB — COMPREHENSIVE METABOLIC PANEL
ALT: 11 U/L (ref 0–44)
AST: 24 U/L (ref 15–41)
Albumin: 3.8 g/dL (ref 3.5–5.0)
Alkaline Phosphatase: 189 U/L — ABNORMAL HIGH (ref 38–126)
Anion gap: 13 (ref 5–15)
BUN: 15 mg/dL (ref 6–20)
CO2: 28 mmol/L (ref 22–32)
Calcium: 8.6 mg/dL — ABNORMAL LOW (ref 8.9–10.3)
Chloride: 93 mmol/L — ABNORMAL LOW (ref 98–111)
Creatinine, Ser: 5.09 mg/dL — ABNORMAL HIGH (ref 0.61–1.24)
GFR calc Af Amer: 13 mL/min — ABNORMAL LOW (ref >60)
GFR calc non Af Amer: 12 mL/min — ABNORMAL LOW (ref >60)
Glucose, Bld: 134 mg/dL — ABNORMAL HIGH (ref 70–99)
Potassium: 3.7 mmol/L (ref 3.5–5.1)
Sodium: 134 mmol/L — ABNORMAL LOW (ref 135–145)
Total Bilirubin: 0.8 mg/dL (ref 0.3–1.2)
Total Protein: 7.1 g/dL (ref 6.5–8.1)

## 2018-01-20 LAB — LIPASE, BLOOD: Lipase: 43 U/L (ref 11–51)

## 2018-01-20 MED ORDER — HYDROMORPHONE HCL 1 MG/ML IJ SOLN
1.0000 mg | Freq: Once | INTRAMUSCULAR | Status: AC
  Administered 2018-01-20: 1 mg via INTRAVENOUS
  Filled 2018-01-20: qty 1

## 2018-01-20 NOTE — ED Triage Notes (Signed)
Pt BIB EMS from home. Pt reports abd pain for "a couple weeks" with a distended abd. Denies N/V/D. Hx of paracentesis. Due for some testing on Tues but couldn't last that long. Gets dialysis on MWF. Had complete dialysis today without complication.

## 2018-01-20 NOTE — ED Notes (Signed)
Patient's heart rate has increased from 66 bpm to 132 bpm. When reassessing patient, he denies CP, SOB, anxiety or anger. States that abd pain is also better. EKG performed and given to provider.

## 2018-01-20 NOTE — ED Notes (Signed)
Patient requesting pain medication; will make provider aware.

## 2018-01-20 NOTE — ED Provider Notes (Signed)
[facility] EMERGENCY DEPARTMENT Provider Note   CSN: [id number] Arrival date & time: 01/20/18  2209     History   Chief Complaint Chief Complaint  Patient presents with  . Abdominal Pain    HPI Claud Gowan is a 57 y.o. male.  HPI 57 year old African-American male with extensive past medical history including end-stage renal disease currently on hemodialysis presents to the emergency department today for evaluation of abdominal pain.  Patient gets dialysis Monday Wednesdays and Fridays and had a complete dialysis today.  Patient has a long-standing history of recurrent abdominal pain.  He has required several paracentesis in the past in the ED.  Patient has a history of SBP.  He is currently on prophylactic antibiotics including Cipro which he states he is taking daily.  Patient reports abdominal pain that is been constant for the past 2 weeks.  Patient cannot describe the abdominal pain.  States that he came to the ED today because the pain was worse.  Patient reports abdominal distention.  Normal pain is generalized.  Patient denies any associated nausea or vomiting.  Denies any associated fevers or chills.  Patient's last bowel movement was today without any melena or hematochezia.  Patient feels like he needs fluids drained off of his abdomen.  Patient seen on 7/9 by GI who has recommended colonoscopy and endoscopy however patient has not been able to complete prep.  Patient has been taking "over-the-counter pain medication" for his pain but cannot say what this was.  States that he is allergic to morphine but that Dilaudid usually works for his pain.  Pt denies any fever, chill, ha, vision changes, lightheadedness, dizziness, congestion, neck pain, cp, sob, cough, n/v/d, urinary symptoms, change in bowel habits, melena, hematochezia, lower extremity paresthesias.   Past Medical History:  Diagnosis Date  . Anemia   . Anxiety   . Arthritis    left  shoulder  . Atherosclerosis of aorta ([place])   . Cardiomegaly   . Chest pain    DATE UNKNOWN, C/O PERIODICALLY  . Cocaine abuse ([place])   . COPD exacerbation ([place]) 08/17/2016  . Coronary artery disease    stent 02/22/17  . ESRD (end stage renal disease) on dialysis ([place])    "[name]; MWF" (07/04/2017)  . GERD (gastroesophageal reflux disease)    DATE UNKNOWN  . Hemorrhoids   . Hepatitis B, chronic ([place])   . Hepatitis C   . History of kidney stones   . Hyperkalemia   . Hypertension   . Metabolic bone disease    Patient denies  . Mitral stenosis   . Myocardial infarction ([place])   . Pneumonia   . Pulmonary edema   . Solitary rectal ulcer syndrome 07/2017   at flex [facility] for rectal bleeding  . Tubular adenoma of colon     Patient Active Problem List   Diagnosis Date Noted  . Hx of colonic polyps 01/20/2018  . ESRD (end stage renal disease) ([place]) 11/21/2017  . GERD (gastroesophageal reflux disease) 11/16/2017  . Liver cirrhosis ([place]) 11/15/2017  . DNR (do not resuscitate)   . Palliative care by specialist   . Hyponatremia 11/04/2017  . SBP (spontaneous bacterial peritonitis) ([place]) 10/30/2017  . Liver disease, chronic 10/30/2017  . SOB (shortness of breath)   . Abdominal pain 10/28/2017  . Upper airway cough syndrome with flattening on f/v loop 10/13/17 c/w vcd 10/17/2017  . Elevated diaphragm 10/13/2017  . Ileus ([place]) 09/29/2017  . Malnutrition of moderate  degree 09/29/2017  . Sinus congestion 09/03/2017  . Symptomatic anemia 09/02/2017  . Other cirrhosis of liver ([place]) 09/02/2017  . Left bundle branch block 09/02/2017  . Mitral stenosis 09/02/2017  . Hematochezia 07/15/2017  . Wide-complex tachycardia ([place])   . Endotracheally intubated   . ESRD on dialysis ([place]) 07/04/2017  . Acute respiratory failure with hypoxia ([place]) 06/18/2017  . CKD (chronic kidney disease) stage V requiring chronic dialysis ([place]) 06/18/2017  . History of Cocaine abuse ([place]) 06/18/2017  .  Hypertension 06/18/2017  . Infection of AV graft for dialysis ([place]) 06/18/2017  . Anxiety 06/18/2017  . Anemia due to chronic kidney disease 06/18/2017  . Atrial flutter with rapid ventricular response ([place]) 06/18/2017  . Personality disorder ([place]) 06/13/2017  . Cellulitis 06/12/2017  . Adjustment disorder with mixed anxiety and depressed mood 06/10/2017  . Suicidal ideation 06/10/2017  . Arm wound, left, sequela 06/10/2017  . Dyspnea on exertion 05/29/2017  . Tachycardia 05/29/2017  . Hyperkalemia 05/22/2017  . Acute metabolic encephalopathy   . Anemia 04/23/2017  . Ascites 04/23/2017  . COPD (chronic obstructive pulmonary disease) ([place]) 04/23/2017  . Acute on chronic respiratory failure with hypoxia ([place]) 03/25/2017  . Arrhythmia 03/25/2017  . COPD GOLD 0 with flattening on inps f/v  09/27/2016  . Essential hypertension 09/27/2016  . Fluid overload 08/30/2016  . COPD exacerbation ([place]) 08/17/2016  . Hypertensive urgency 08/17/2016  . Respiratory failure ([place]) 08/17/2016  . Problem with dialysis access ([place]) 07/23/2016  . Chronic hepatitis B ([place]) 03/05/2014  . Chronic hepatitis C without hepatic coma ([place]) 03/05/2014  . Internal hemorrhoids with bleeding, swelling and itching 03/05/2014  . Thrombocytopenia ([place]) 03/05/2014  . Chest pain 02/27/2014  . Alcohol abuse 04/14/2009  . Cigarette smoker 04/14/2009  . GANGLION CYST 04/14/2009    Past Surgical History:  Procedure Laterality Date  . A/V FISTULAGRAM Left 05/26/2017   Procedure: A/V FISTULAGRAM;  Surgeon: Conrad Lovell, MD;  Location: [place] [facility];  Service: Cardiovascular;  Laterality: Left;  . A/V FISTULAGRAM Right 11/18/2017   Procedure: A/V FISTULAGRAM - Right Arm;  Surgeon: Elam Dutch, MD;  Location: [place] [facility];  Service: Cardiovascular;  Laterality: Right;  . APPLICATION OF WOUND VAC Left 06/14/2017   Procedure: APPLICATION OF WOUND VAC;  Surgeon: Katha Cabal, MD;  Location: [facility];   Service: Vascular;  Laterality: Left;  . AV FISTULA PLACEMENT  2012   BELIEVED WAS PLACED IN JUNE  . AV FISTULA PLACEMENT Right 08/09/2017   Procedure: Creation Right arm ARTERIOVENOUS BRACHIOCEPOHALIC FISTULA;  Surgeon: Elam Dutch, MD;  Location: [facility];  Service: Vascular;  Laterality: Right;  . AV FISTULA PLACEMENT Right 11/22/2017   Procedure: INSERTION OF ARTERIOVENOUS (AV) GORE-TEX GRAFT RIGHT UPPER ARM;  Surgeon: Elam Dutch, MD;  Location: [place];  Service: Vascular;  Laterality: Right;  . COLONOSCOPY    . CORONARY STENT INTERVENTION N/A 02/22/2017   Procedure: CORONARY STENT INTERVENTION;  Surgeon: Nigel Mormon, MD;  Location: [place] [facility];  Service: Cardiovascular;  Laterality: N/A;  . FLEXIBLE SIGMOIDOSCOPY N/A 07/15/2017   Procedure: FLEXIBLE SIGMOIDOSCOPY;  Surgeon: Carol Ada, MD;  Location: [place];  Service: Endoscopy;  Laterality: N/A;  . HEMORRHOID BANDING    . I&D EXTREMITY Left 06/01/2017   Procedure: IRRIGATION AND DEBRIDEMENT LEFT ARM HEMATOMA WITH LIGATION OF LEFT ARM AV FISTULA;  Surgeon: Elam Dutch, MD;  Location: [place];  Service: Vascular;  Laterality: Left;  . I&D EXTREMITY  Left 06/14/2017   Procedure: IRRIGATION AND DEBRIDEMENT EXTREMITY;  Surgeon: Katha Cabal, MD;  Location: [facility];  Service: Vascular;  Laterality: Left;  . INSERTION OF DIALYSIS CATHETER  05/30/2017  . INSERTION OF DIALYSIS CATHETER N/A 05/30/2017   Procedure: INSERTION OF DIALYSIS CATHETER;  Surgeon: Elam Dutch, MD;  Location: [place];  Service: Vascular;  Laterality: N/A;  . IR PARACENTESIS  08/30/2017  . IR PARACENTESIS  09/29/2017  . IR PARACENTESIS  10/28/2017  . IR PARACENTESIS  11/09/2017  . IR PARACENTESIS  11/16/2017  . IR PARACENTESIS  11/28/2017  . IR PARACENTESIS  12/01/2017  . IR PARACENTESIS  12/06/2017  . IR PARACENTESIS  01/03/2018  . LEFT HEART CATH AND CORONARY ANGIOGRAPHY N/A 02/22/2017   Procedure: LEFT HEART CATH AND CORONARY  ANGIOGRAPHY;  Surgeon: Nigel Mormon, MD;  Location: [place] [facility];  Service: Cardiovascular;  Laterality: N/A;  . LIGATION OF ARTERIOVENOUS  FISTULA Left 10/16/6544   Procedure: Plication of Left Arm Arteriovenous Fistula;  Surgeon: Elam Dutch, MD;  Location: [place];  Service: Vascular;  Laterality: Left;  . POLYPECTOMY    . REVISON OF ARTERIOVENOUS FISTULA Left 11/12/5463   Procedure: PLICATION OF DISTAL ANEURYSMAL SEGEMENT OF LEFT UPPER ARM ARTERIOVENOUS FISTULA;  Surgeon: Elam Dutch, MD;  Location: [place];  Service: Vascular;  Laterality: Left;  . REVISON OF ARTERIOVENOUS FISTULA Left 6/81/2751   Procedure: Plication of Left Upper Arm Fistula ;  Surgeon: Waynetta Sandy, MD;  Location: [place];  Service: Vascular;  Laterality: Left;  . SKIN GRAFT SPLIT THICKNESS LEG / FOOT Left    SKIN GRAFT SPLIT THICKNESS LEFT ARM DONOR SITE: LEFT ANTERIOR THIGH  . SKIN SPLIT GRAFT Left 07/04/2017   Procedure: SKIN GRAFT SPLIT THICKNESS LEFT ARM DONOR SITE: LEFT ANTERIOR THIGH;  Surgeon: Elam Dutch, MD;  Location: [place];  Service: Vascular;  Laterality: Left;  . THROMBECTOMY W/ EMBOLECTOMY Left 06/05/2017   Procedure: EXPLORATION OF LEFT ARM FOR BLEEDING; OVERSEWED PROXIMAL FISTULA;  Surgeon: Angelia Mould, MD;  Location: [place];  Service: Vascular;  Laterality: Left;  . WOUND EXPLORATION Left 06/03/2017   Procedure: WOUND EXPLORATION WITH WOUND VAC APPLICATION TO LEFT ARM;  Surgeon: Angelia Mould, MD;  Location: [place];  Service: Vascular;  Laterality: Left;        Home Medications    Prior to Admission medications   Medication Sig Start Date End Date Taking? Authorizing Provider  albuterol (VENTOLIN HFA) 108 (90 Base) MCG/ACT inhaler Inhale 2 puffs into the lungs every 6 (six) hours as needed for wheezing or shortness of breath.    [provider]  budesonide-formoterol (SYMBICORT) 160-4.5 MCG/ACT inhaler Inhale 2 puffs into the lungs 2 (two)  times daily.    [provider]  ciprofloxacin (CIPRO) 500 MG tablet Take 500 mg by mouth at bedtime.    [provider]  clopidogrel (PLAVIX) 75 MG tablet Take 75 mg by mouth daily.    [provider]  diltiazem (CARDIZEM CD) 120 MG 24 hr capsule Take 1 capsule (120 mg total) by mouth daily. 06/23/17 06/23/18  ShahmehdiValeria Batman, MD  Fluticasone-Salmeterol (ADVAIR) 250-50 MCG/DOSE AEPB Inhale 1 puff into the lungs 2 (two) times daily.    [provider]  gabapentin (NEURONTIN) 100 MG capsule Take 100 mg by mouth 2 (two) times daily. 12/13/17   [provider]  hydrALAZINE (APRESOLINE) 100 MG tablet Take 1 tablet by mouth twice a day take  after HD on dialysis days 08/31/17   [provider]  lactulose, encephalopathy, (CHRONULAC) 10 GM/15ML SOLN Take 15 ml's by mouth twice daily. 01/17/18   Esterwood, Amy S, PA-C  oxyCODONE (OXY IR/ROXICODONE) 5 MG immediate release tablet Take 5 mg by mouth 2 (two) times daily as needed for pain. 12/13/17   [provider]  sevelamer carbonate (RENVELA) 800 MG tablet Take 4 tablets by mouth 2 (two) times daily with a meal. And 2 tablets twice daily with snacks 01/12/18   [provider]    Family History Family History  Problem Relation Age of Onset  . Heart disease Mother   . Lung cancer Mother   . Heart disease Father   . Malignant hyperthermia Father   . COPD Father   . Throat cancer Sister   . Esophageal cancer Sister   . Hypertension Other   . COPD Other   . Colon cancer Neg Hx   . Colon polyps Neg Hx   . Rectal cancer Neg Hx   . Stomach cancer Neg Hx     Social History Social History   Tobacco Use  . Smoking status: Current Every Day Smoker    Packs/day: 0.50    Years: 43.00    Pack years: 21.50    Types: Cigarettes    Start date: 08/13/1973  . Smokeless tobacco: Never Used  Substance Use Topics  . Alcohol use: Not Currently    Frequency: Never    Comment: quit drinking in  2017  . Drug use: Yes    Types: Marijuana    Comment: quit in 2017"     Allergies   Morphine and related; Aspirin; Clonidine derivatives; Tramadol; and Tylenol [acetaminophen]   Review of Systems Review of Systems  All other systems reviewed and are negative.    Physical Exam Updated Vital Signs Ht 5\' 9"  (1.753 m)   Wt 68.5 kg (151 lb 0.2 oz)   SpO2 98%   BMI 22.30 kg/m   Physical Exam Constitutional: He is oriented to person, place, and time. He appears well-developed and well-nourished. No distress.  Chronically ill-appearing  HENT:  Head: Normocephalic and atraumatic.  Eyes: Pupils are equal, round, and reactive to light. Conjunctivae are normal. Right eye exhibits no discharge. Left eye exhibits no discharge. No scleral icterus.  Neck: Normal range of motion.  Cardiovascular: Normal rate and regular rhythm.  Pulmonary/Chest: Effort normal and breath sounds normal. No respiratory distress.  Catheter and right upper chest wall  Abdominal: Soft. Bowel sounds are normal. He exhibits distension. There is tenderness.  Diffuse tenderness.  There is swelling and mild ascites  Musculoskeletal:  Right sided fistula with palpable thrill  Neurological: He is alert and oriented to person, place, and time.  Skin: Skin is warm and dry.  Psychiatric: He has a normal mood and affect. His behavior is normal.  Nursing note and vitals reviewed.    ED Treatments / Results  Labs (all labs ordered are listed, but only abnormal results are displayed) Labs Reviewed  COMPREHENSIVE METABOLIC PANEL - Abnormal; Notable for the following components:      Result Value   Sodium 134 (*)    Chloride 93 (*)    Glucose, Bld 134 (*)    Creatinine, Ser 5.09 (*)    Calcium 8.6 (*)    Alkaline Phosphatase 189 (*)    GFR calc non Af Amer 12 (*)    GFR calc Af Amer 13 (*)    All other  components within normal limits  CBC - Abnormal; Notable for the following components:   RBC 3.77 (*)     Hemoglobin 10.2 (*)    HCT 30.4 (*)    RDW 20.3 (*)    Platelets 98 (*)    All other components within normal limits  LIPASE, BLOOD    EKG None  Radiology No results found.  Procedures .Critical Care Performed by: Doristine Devoid, PA-C Authorized by: Doristine Devoid, PA-C   Critical care provider statement:    Critical care time (minutes):  55   Critical care was necessary to treat or prevent imminent or life-threatening deterioration of the following conditions: Atrial flutter with RVR requiring Cardizem drip.   Critical care was time spent personally by me on the following activities:  Development of treatment plan with patient or surrogate, discussions with consultants, discussions with primary provider, evaluation of patient's response to treatment, examination of patient, obtaining history from patient or surrogate, ordering and performing treatments and interventions, ordering and review of laboratory studies, ordering and review of radiographic studies, pulse oximetry, re-evaluation of patient's condition and review of old charts   (including critical care time)  Medications Ordered in ED Medications - No data to display   Initial Impression / Assessment and Plan / ED Course  I have reviewed the triage vital signs and the nursing notes.  Pertinent labs & imaging results that were available during my care of the patient were reviewed by me and considered in my medical decision making (see chart for details).     Presents to the ED for evaluation of abdominal pain.  Patient with history of chronic abdominal pain that has required paracentesis in the past secondary to EtOH induced cirrhosis.  Patient's medical history is also complicated by end-stage renal disease on hemodialysis.  He was dialyzed earlier today.  Patient denies associated vomiting, fevers, chills.  Patient does have a history of SBP but is currently on prophylactic Cipro.  Patient's vital signs  initially were reassuring.  Patient was afebrile without any tachycardia or hypotension.  Patient had some mild tenderness palpation that was generalized but no signs of peritonitis.  Bowel sounds present in all 4 quadrants.  Mild ascites noted.  Heart regular rate and rhythm.  Lungs clear to auscultation bilaterally.  Initial lab work was reassuring.  Patient had no leukocytosis.  That was at patient's baseline.  Patient has a thrombocytopenia with history of same.  No significant elect light derangement.  This includes normal potassium.  Normal lipase.  Patient was given pain medication.  I was then informed by nursing staff that patient's heart rate increased to the 130s and was sustained at that rate.  Patient pain improved with Dilaudid.  Patient's blood pressure remained stable.  He denied chest pain or shortness of breath.  Patient has a history of a flutter.  EKG seems consistent with a flutter without any signs of acute ischemia.  Patient was given a bolus of 5 mg of heart exam which had no effect.  Cardizem drip was ordered.  Patient rate slowed to 65 and shows a flutter.  I suspect that patient was in a flutter with RVR.  She remains hemodynamic stable this time.  We will admit to the hospital service for further work-up and possible therapeutic paracentesis.  Doubt SBP at this time.  Given normal lipase doubt pancreatitis.  Patient has no signs of peritonitis without a leukocytosis.  No indication for further imaging at this time.  Pt seen and evaluated by my attending who is agreed with the above plan.  I discussed patient with hospital medicine Dr. Blaine Hamper who agrees to admission will see patient in the ED and place admission orders.  Patient remains hemodynamic stable this time.  Final Clinical Impressions(s) / ED Diagnoses   Final diagnoses:  Generalized abdominal pain  Atrial flutter, unspecified type [facility])    ED Discharge Orders    None       Aaron Edelman 01/21/18  0220    Veryl Speak, MD 01/21/18 [phone]

## 2018-01-21 ENCOUNTER — Other Ambulatory Visit: Payer: Self-pay

## 2018-01-21 ENCOUNTER — Other Ambulatory Visit (HOSPITAL_COMMUNITY): Payer: Self-pay

## 2018-01-21 ENCOUNTER — Inpatient Hospital Stay (HOSPITAL_COMMUNITY): Payer: Medicare Other

## 2018-01-21 ENCOUNTER — Encounter (HOSPITAL_COMMUNITY): Payer: Self-pay | Admitting: *Deleted

## 2018-01-21 DIAGNOSIS — Z792 Long term (current) use of antibiotics: Secondary | ICD-10-CM | POA: Diagnosis not present

## 2018-01-21 DIAGNOSIS — E8889 Other specified metabolic disorders: Secondary | ICD-10-CM | POA: Diagnosis present

## 2018-01-21 DIAGNOSIS — I2510 Atherosclerotic heart disease of native coronary artery without angina pectoris: Secondary | ICD-10-CM | POA: Diagnosis present

## 2018-01-21 DIAGNOSIS — M19012 Primary osteoarthritis, left shoulder: Secondary | ICD-10-CM | POA: Diagnosis present

## 2018-01-21 DIAGNOSIS — I4892 Unspecified atrial flutter: Secondary | ICD-10-CM | POA: Diagnosis not present

## 2018-01-21 DIAGNOSIS — R109 Unspecified abdominal pain: Secondary | ICD-10-CM

## 2018-01-21 DIAGNOSIS — F17210 Nicotine dependence, cigarettes, uncomplicated: Secondary | ICD-10-CM | POA: Diagnosis not present

## 2018-01-21 DIAGNOSIS — Z886 Allergy status to analgesic agent status: Secondary | ICD-10-CM | POA: Diagnosis not present

## 2018-01-21 DIAGNOSIS — Z7951 Long term (current) use of inhaled steroids: Secondary | ICD-10-CM | POA: Diagnosis not present

## 2018-01-21 DIAGNOSIS — J449 Chronic obstructive pulmonary disease, unspecified: Secondary | ICD-10-CM | POA: Diagnosis present

## 2018-01-21 DIAGNOSIS — I050 Rheumatic mitral stenosis: Secondary | ICD-10-CM | POA: Diagnosis present

## 2018-01-21 DIAGNOSIS — I4891 Unspecified atrial fibrillation: Secondary | ICD-10-CM | POA: Diagnosis present

## 2018-01-21 DIAGNOSIS — I10 Essential (primary) hypertension: Secondary | ICD-10-CM | POA: Diagnosis not present

## 2018-01-21 DIAGNOSIS — F1410 Cocaine abuse, uncomplicated: Secondary | ICD-10-CM | POA: Diagnosis not present

## 2018-01-21 DIAGNOSIS — R402413 Glasgow coma scale score 13-15, at hospital admission: Secondary | ICD-10-CM | POA: Diagnosis present

## 2018-01-21 DIAGNOSIS — N189 Chronic kidney disease, unspecified: Secondary | ICD-10-CM | POA: Diagnosis not present

## 2018-01-21 DIAGNOSIS — K7210 Chronic hepatic failure without coma: Secondary | ICD-10-CM | POA: Diagnosis present

## 2018-01-21 DIAGNOSIS — R1084 Generalized abdominal pain: Secondary | ICD-10-CM

## 2018-01-21 DIAGNOSIS — Z888 Allergy status to other drugs, medicaments and biological substances status: Secondary | ICD-10-CM | POA: Diagnosis not present

## 2018-01-21 DIAGNOSIS — J42 Unspecified chronic bronchitis: Secondary | ICD-10-CM | POA: Diagnosis not present

## 2018-01-21 DIAGNOSIS — N186 End stage renal disease: Secondary | ICD-10-CM | POA: Diagnosis not present

## 2018-01-21 DIAGNOSIS — K219 Gastro-esophageal reflux disease without esophagitis: Secondary | ICD-10-CM | POA: Diagnosis present

## 2018-01-21 DIAGNOSIS — K7031 Alcoholic cirrhosis of liver with ascites: Secondary | ICD-10-CM | POA: Diagnosis not present

## 2018-01-21 DIAGNOSIS — R188 Other ascites: Secondary | ICD-10-CM | POA: Diagnosis not present

## 2018-01-21 DIAGNOSIS — D696 Thrombocytopenia, unspecified: Secondary | ICD-10-CM | POA: Diagnosis not present

## 2018-01-21 DIAGNOSIS — F1011 Alcohol abuse, in remission: Secondary | ICD-10-CM | POA: Diagnosis present

## 2018-01-21 DIAGNOSIS — B181 Chronic viral hepatitis B without delta-agent: Secondary | ICD-10-CM | POA: Diagnosis present

## 2018-01-21 DIAGNOSIS — K652 Spontaneous bacterial peritonitis: Secondary | ICD-10-CM | POA: Diagnosis not present

## 2018-01-21 DIAGNOSIS — I252 Old myocardial infarction: Secondary | ICD-10-CM | POA: Diagnosis not present

## 2018-01-21 DIAGNOSIS — I700 Atherosclerosis of aorta: Secondary | ICD-10-CM | POA: Diagnosis present

## 2018-01-21 DIAGNOSIS — I120 Hypertensive chronic kidney disease with stage 5 chronic kidney disease or end stage renal disease: Secondary | ICD-10-CM | POA: Diagnosis present

## 2018-01-21 DIAGNOSIS — D631 Anemia in chronic kidney disease: Secondary | ICD-10-CM | POA: Diagnosis not present

## 2018-01-21 LAB — BODY FLUID CELL COUNT WITH DIFFERENTIAL
Eos, Fluid: 0 %
Lymphs, Fluid: 30 %
Monocyte-Macrophage-Serous Fluid: 69 % (ref 50–90)
Neutrophil Count, Fluid: 1 % (ref 0–25)
Total Nucleated Cell Count, Fluid: 228 cu mm (ref 0–1000)

## 2018-01-21 LAB — CBC
HCT: 30.4 % — ABNORMAL LOW (ref 39.0–52.0)
HCT: 29.8 % — ABNORMAL LOW (ref 39.0–52.0)
Hemoglobin: 10.2 g/dL — ABNORMAL LOW (ref 13.0–17.0)
Hemoglobin: 9.8 g/dL — ABNORMAL LOW (ref 13.0–17.0)
MCH: 27.1 pg (ref 26.0–34.0)
MCH: 26.6 pg (ref 26.0–34.0)
MCHC: 33.6 g/dL (ref 30.0–36.0)
MCHC: 32.9 g/dL (ref 30.0–36.0)
MCV: 80.6 fL (ref 78.0–100.0)
MCV: 80.8 fL (ref 78.0–100.0)
Platelets: 98 10*3/uL — ABNORMAL LOW (ref 150–400)
Platelets: 68 10*3/uL — ABNORMAL LOW (ref 150–400)
RBC: 3.77 MIL/uL — ABNORMAL LOW (ref 4.22–5.81)
RBC: 3.69 MIL/uL — ABNORMAL LOW (ref 4.22–5.81)
RDW: 20.3 % — ABNORMAL HIGH (ref 11.5–15.5)
RDW: 19.8 % — ABNORMAL HIGH (ref 11.5–15.5)
WBC: 8.7 10*3/uL (ref 4.0–10.5)
WBC: 6.6 10*3/uL (ref 4.0–10.5)

## 2018-01-21 LAB — BASIC METABOLIC PANEL
Anion gap: 14 (ref 5–15)
BUN: 17 mg/dL (ref 6–20)
CO2: 29 mmol/L (ref 22–32)
Calcium: 8.8 mg/dL — ABNORMAL LOW (ref 8.9–10.3)
Chloride: 91 mmol/L — ABNORMAL LOW (ref 98–111)
Creatinine, Ser: 5.3 mg/dL — ABNORMAL HIGH (ref 0.61–1.24)
GFR calc Af Amer: 13 mL/min — ABNORMAL LOW (ref >60)
GFR calc non Af Amer: 11 mL/min — ABNORMAL LOW (ref >60)
Glucose, Bld: 92 mg/dL (ref 70–99)
Potassium: 4.3 mmol/L (ref 3.5–5.1)
Sodium: 134 mmol/L — ABNORMAL LOW (ref 135–145)

## 2018-01-21 LAB — GRAM STAIN

## 2018-01-21 LAB — MRSA PCR SCREENING: MRSA by PCR: NEGATIVE

## 2018-01-21 LAB — ALBUMIN, PLEURAL OR PERITONEAL FLUID: Albumin, Fluid: 2.9 g/dL

## 2018-01-21 LAB — APTT: aPTT: 36 seconds (ref 24–36)

## 2018-01-21 LAB — PROTIME-INR
INR: 1.26
Prothrombin Time: 15.7 seconds — ABNORMAL HIGH (ref 11.4–15.2)

## 2018-01-21 LAB — TSH: TSH: 1.654 u[IU]/mL (ref 0.350–4.500)

## 2018-01-21 MED ORDER — ZOLPIDEM TARTRATE 5 MG PO TABS: 5.0000 mg | ORAL_TABLET | Freq: Every evening | ORAL | Status: DC | PRN

## 2018-01-21 MED ORDER — HYDROMORPHONE HCL 1 MG/ML IJ SOLN
1.0000 mg | INTRAMUSCULAR | Status: DC | PRN
  Administered 2018-01-21 – 2018-01-22 (×4): 1 mg via INTRAVENOUS
  Filled 2018-01-21 (×4): qty 1

## 2018-01-21 MED ORDER — SENNOSIDES-DOCUSATE SODIUM 8.6-50 MG PO TABS: 1.0000 | ORAL_TABLET | Freq: Every evening | ORAL | Status: DC | PRN

## 2018-01-21 MED ORDER — TRAZODONE HCL 50 MG PO TABS
50.0000 mg | ORAL_TABLET | Freq: Once | ORAL | Status: AC
  Administered 2018-01-21: 50 mg via ORAL
  Filled 2018-01-21: qty 1

## 2018-01-21 MED ORDER — MOMETASONE FURO-FORMOTEROL FUM 200-5 MCG/ACT IN AERO
2.0000 | INHALATION_SPRAY | Freq: Two times a day (BID) | RESPIRATORY_TRACT | Status: DC
  Administered 2018-01-21 – 2018-01-22 (×3): 2 via RESPIRATORY_TRACT
  Filled 2018-01-21: qty 8.8

## 2018-01-21 MED ORDER — OXYCODONE HCL 5 MG PO TABS
5.0000 mg | ORAL_TABLET | Freq: Two times a day (BID) | ORAL | Status: DC | PRN
Start: 2018-01-21 — End: 2018-01-22
  Administered 2018-01-21 – 2018-01-22 (×4): 5 mg via ORAL
  Filled 2018-01-21 (×4): qty 1

## 2018-01-21 MED ORDER — HYDRALAZINE HCL 50 MG PO TABS
100.0000 mg | ORAL_TABLET | Freq: Two times a day (BID) | ORAL | Status: DC
  Administered 2018-01-21 – 2018-01-22 (×3): 100 mg via ORAL
  Filled 2018-01-21 (×3): qty 2

## 2018-01-21 MED ORDER — SODIUM CHLORIDE 0.9 % IV SOLN
2.0000 g | Freq: Once | INTRAVENOUS | Status: AC
  Administered 2018-01-21: 2 g via INTRAVENOUS
  Filled 2018-01-21 (×2): qty 2

## 2018-01-21 MED ORDER — DILTIAZEM HCL-DEXTROSE 100-5 MG/100ML-% IV SOLN (PREMIX): 5.0000 mg/h | INTRAVENOUS | Status: DC

## 2018-01-21 MED ORDER — GABAPENTIN 100 MG PO CAPS
100.0000 mg | ORAL_CAPSULE | Freq: Two times a day (BID) | ORAL | Status: DC
  Administered 2018-01-21 – 2018-01-22 (×4): 100 mg via ORAL
  Filled 2018-01-21 (×4): qty 1

## 2018-01-21 MED ORDER — ALBUMIN HUMAN 25 % IV SOLN
100.0000 g | Freq: Once | INTRAVENOUS | Status: AC
  Administered 2018-01-21: 100 g via INTRAVENOUS
  Filled 2018-01-21 (×2): qty 400

## 2018-01-21 MED ORDER — DIPHENHYDRAMINE HCL 25 MG PO CAPS
25.0000 mg | ORAL_CAPSULE | ORAL | Status: DC | PRN
  Administered 2018-01-21: 25 mg via ORAL
  Filled 2018-01-21: qty 1

## 2018-01-21 MED ORDER — ONDANSETRON HCL 4 MG/2ML IJ SOLN
4.0000 mg | Freq: Once | INTRAMUSCULAR | Status: AC
Start: 2018-01-21 — End: 2018-01-21
  Administered 2018-01-21: 4 mg via INTRAVENOUS
  Filled 2018-01-21: qty 2

## 2018-01-21 MED ORDER — HYDROMORPHONE HCL 1 MG/ML IJ SOLN
1.0000 mg | INTRAMUSCULAR | Status: AC | PRN
  Administered 2018-01-21 (×3): 1 mg via INTRAVENOUS
  Filled 2018-01-21 (×3): qty 1

## 2018-01-21 MED ORDER — SEVELAMER CARBONATE 800 MG PO TABS
3200.0000 mg | ORAL_TABLET | Freq: Two times a day (BID) | ORAL | Status: DC
  Administered 2018-01-21 – 2018-01-22 (×3): 3200 mg via ORAL
  Filled 2018-01-21 (×3): qty 4

## 2018-01-21 MED ORDER — HYDROMORPHONE HCL 1 MG/ML IJ SOLN
1.0000 mg | INTRAMUSCULAR | Status: DC | PRN
  Administered 2018-01-21: 1 mg via INTRAVENOUS
  Filled 2018-01-21: qty 1

## 2018-01-21 MED ORDER — LACTULOSE 10 GM/15ML PO SOLN
10.0000 g | Freq: Two times a day (BID) | ORAL | Status: DC
  Administered 2018-01-21 – 2018-01-22 (×3): 10 g via ORAL
  Filled 2018-01-21 (×3): qty 15

## 2018-01-21 MED ORDER — DIPHENHYDRAMINE HCL 12.5 MG/5ML PO ELIX
25.0000 mg | ORAL_SOLUTION | Freq: Four times a day (QID) | ORAL | Status: DC | PRN
  Administered 2018-01-21 – 2018-01-22 (×3): 25 mg via ORAL
  Filled 2018-01-21 (×3): qty 10

## 2018-01-21 MED ORDER — DILTIAZEM HCL 25 MG/5ML IV SOLN
5.0000 mg | Freq: Once | INTRAVENOUS | Status: AC
  Administered 2018-01-21: 5 mg via INTRAVENOUS
  Filled 2018-01-21: qty 5

## 2018-01-21 MED ORDER — NICOTINE 21 MG/24HR TD PT24
21.0000 mg | MEDICATED_PATCH | Freq: Every day | TRANSDERMAL | Status: DC
  Filled 2018-01-21: qty 1

## 2018-01-21 MED ORDER — ALBUMIN HUMAN 25 % IV SOLN
100.0000 g | Freq: Once | INTRAVENOUS | Status: DC
  Filled 2018-01-21 (×2): qty 400

## 2018-01-21 MED ORDER — CEFTAZIDIME 2 G IJ SOLR
2.0000 g | INTRAMUSCULAR | Status: DC
Start: 2018-01-23 — End: 2018-01-22

## 2018-01-21 MED ORDER — SEVELAMER CARBONATE 800 MG PO TABS: 1600.0000 mg | ORAL_TABLET | ORAL | Status: DC | PRN

## 2018-01-21 MED ORDER — ALBUTEROL SULFATE (2.5 MG/3ML) 0.083% IN NEBU: 2.5000 mg | INHALATION_SOLUTION | RESPIRATORY_TRACT | Status: DC | PRN

## 2018-01-21 MED ORDER — HYDRALAZINE HCL 20 MG/ML IJ SOLN: 5.0000 mg | INTRAMUSCULAR | Status: DC | PRN

## 2018-01-21 MED ORDER — DILTIAZEM HCL-DEXTROSE 100-5 MG/100ML-% IV SOLN (PREMIX)
5.0000 mg/h | Freq: Once | INTRAVENOUS | Status: AC
  Administered 2018-01-21: 5 mg/h via INTRAVENOUS
  Filled 2018-01-21: qty 100

## 2018-01-21 MED ORDER — LIDOCAINE HCL (PF) 1 % IJ SOLN
INTRAMUSCULAR | Status: AC
  Filled 2018-01-21: qty 30

## 2018-01-21 MED ORDER — DILTIAZEM HCL ER COATED BEADS 120 MG PO CP24
120.0000 mg | ORAL_CAPSULE | Freq: Every day | ORAL | Status: DC
  Administered 2018-01-21 – 2018-01-22 (×2): 120 mg via ORAL
  Filled 2018-01-21 (×2): qty 1

## 2018-01-21 MED ORDER — CLOPIDOGREL BISULFATE 75 MG PO TABS
75.0000 mg | ORAL_TABLET | Freq: Every day | ORAL | Status: DC
  Administered 2018-01-21 – 2018-01-22 (×2): 75 mg via ORAL
  Filled 2018-01-21 (×2): qty 1

## 2018-01-21 MED ORDER — ONDANSETRON HCL 4 MG/2ML IJ SOLN: 4.0000 mg | Freq: Three times a day (TID) | INTRAMUSCULAR | Status: DC | PRN

## 2018-01-21 NOTE — Progress Notes (Signed)
Pt's rhythm appears to have converted from [place] with irregular HR in 60s to 80s to ST 120s sustained. Conversion appears at 0914 on telemetry. MD made aware.

## 2018-01-21 NOTE — Progress Notes (Addendum)
Pt returned, telemetry reading Aflutter, rate 60s. Abdomen tender at puncture site, much softer. Pt requesting food and pain medications. Will continue to monitor.  1330 pt resting in bed, reports continued pain, denies palpitations, chest discomfort, lightheadedness. Sustaining heart rate 120s at this time. Telemetry notified me and is saving a strip. Will continue to monitor.

## 2018-01-21 NOTE — Procedures (Signed)
  PROCEDURE SUMMARY:  Successful US guided paracentesis from LLQ.  Yielded 2.1 L of clear yellow fluid.  No immediate complications.  Pt tolerated well.   Specimen was sent for labs.  Ascencion Dike PA-C 01/21/2018 12:22 PM

## 2018-01-21 NOTE — ED Notes (Signed)
Attempted to call report; states nurse is in a contact room and will call me back.

## 2018-01-21 NOTE — H&P (Addendum)
History and Physical    [name] FBP:[id number] DOB: 04/14/1961 DOA: 01/20/2018  Referring MD/NP/PA:   PCP: [name], MD   Patient coming from:  The patient is coming from home.  At baseline, pt is independent for most of ADL.   Chief Complaint: Abdominal pain and abdominal distention  HPI: [name] is a 57 y.o. male with medical history significant of alcohol abuse in remission,  HBV, HCV, cirrhosis with ascites, SBP, hypertension, COPD, GERD, anxiety, anemia, polysubstance abuse (tobacco, cocaine and marijuana), CAD, ESRD-HD (MWF), A fib/A flutter not on AC, who presents with abdominal pain and abdominal distention.  Patient states that he has worsening abdominal pain for several weeks, which much worse today.  He also has abdominal distention.  His abdominal pain is constant, diffuse, 8 out of 10 severity, sharp, nonradiating.  No nausea, vomiting, diarrhea.  Denies fever or chills.  Patient was found to have atrial flutter with RVR in the ED, but denies chest pain, shortness of breath.  No fever, chills, cough, symptoms of UTI or unilateral weakness.  ED Course: pt was found to have WBC 8.7, creatinine 5.09, BUN 15, lipase 43, temperature normal, has tachypnea, oxygen saturation 93 to 95% on room air.  Patient is admitted to stepdown as inpatient.  Review of Systems:   General: no fevers, chills, no body weight gain, has poor appetite, has fatigue HEENT: no blurry vision, hearing changes or sore throat Respiratory: no dyspnea, coughing, wheezing CV: no chest pain, no palpitations GI: no nausea, vomiting, has abdominal pain and distension, no diarrhea, constipation GU: no dysuria, burning on urination, increased urinary frequency, hematuria  Ext: no leg edema Neuro: no unilateral weakness, numbness, or tingling, no vision change or hearing loss Skin: no rash, no skin tear. MSK: No muscle spasm, no deformity, no limitation of range of movement in  spin Heme: No easy bruising.  Travel history: No recent long distant travel.  Allergy:  Allergies  Allergen Reactions  . Morphine And Related Other (See Comments)    Stomach pain  . Aspirin Other (See Comments)    STOMACH PAIN  . Clonidine Derivatives Itching  . Tramadol Itching  . Tylenol [Acetaminophen] Nausea Only    Stomach ache    Past Medical History:  Diagnosis Date  . Anemia   . Anxiety   . Arthritis    left shoulder  . Atherosclerosis of aorta ([place])   . Cardiomegaly   . Chest pain    DATE UNKNOWN, C/O PERIODICALLY  . Cocaine abuse ([place])   . COPD exacerbation ([place]) 08/17/2016  . Coronary artery disease    stent 02/22/17  . ESRD (end stage renal disease) on dialysis ([place])    "[name]; MWF" (07/04/2017)  . GERD (gastroesophageal reflux disease)    DATE UNKNOWN  . Hemorrhoids   . Hepatitis B, chronic ([place])   . Hepatitis C   . History of kidney stones   . Hyperkalemia   . Hypertension   . Metabolic bone disease    Patient denies  . Mitral stenosis   . Myocardial infarction ([place])   . Pneumonia   . Pulmonary edema   . Solitary rectal ulcer syndrome 07/2017   at flex sig for rectal bleeding  . Tubular adenoma of colon     Past Surgical History:  Procedure Laterality Date  . A/V FISTULAGRAM Left 05/26/2017   Procedure: A/V FISTULAGRAM;  Surgeon: Conrad [place], MD;  Location: [place] [facility];  Service: Cardiovascular;  Laterality: Left;  . A/V FISTULAGRAM Right 11/18/2017   Procedure: A/V FISTULAGRAM - Right Arm;  Surgeon: Elam Dutch, MD;  Location: [place] [facility];  Service: Cardiovascular;  Laterality: Right;  . APPLICATION OF WOUND VAC Left 06/14/2017   Procedure: APPLICATION OF WOUND VAC;  Surgeon: Katha Cabal, MD;  Location: [facility];  Service: Vascular;  Laterality: Left;  . AV FISTULA PLACEMENT  2012   BELIEVED WAS PLACED IN JUNE  . AV FISTULA PLACEMENT Right 08/09/2017   Procedure: Creation Right arm ARTERIOVENOUS  BRACHIOCEPOHALIC FISTULA;  Surgeon: Elam Dutch, MD;  Location: [facility];  Service: Vascular;  Laterality: Right;  . AV FISTULA PLACEMENT Right 11/22/2017   Procedure: INSERTION OF ARTERIOVENOUS (AV) GORE-TEX GRAFT RIGHT UPPER ARM;  Surgeon: Elam Dutch, MD;  Location: [place];  Service: Vascular;  Laterality: Right;  . COLONOSCOPY    . CORONARY STENT INTERVENTION N/A 02/22/2017   Procedure: CORONARY STENT INTERVENTION;  Surgeon: Nigel Mormon, MD;  Location: [place] [facility];  Service: Cardiovascular;  Laterality: N/A;  . FLEXIBLE SIGMOIDOSCOPY N/A 07/15/2017   Procedure: FLEXIBLE SIGMOIDOSCOPY;  Surgeon: Carol Ada, MD;  Location: [place];  Service: Endoscopy;  Laterality: N/A;  . HEMORRHOID BANDING    . I&D EXTREMITY Left 06/01/2017   Procedure: IRRIGATION AND DEBRIDEMENT LEFT ARM HEMATOMA WITH LIGATION OF LEFT ARM AV FISTULA;  Surgeon: Elam Dutch, MD;  Location: [place];  Service: Vascular;  Laterality: Left;  . I&D EXTREMITY Left 06/14/2017   Procedure: IRRIGATION AND DEBRIDEMENT EXTREMITY;  Surgeon: Katha Cabal, MD;  Location: [facility];  Service: Vascular;  Laterality: Left;  . INSERTION OF DIALYSIS CATHETER  05/30/2017  . INSERTION OF DIALYSIS CATHETER N/A 05/30/2017   Procedure: INSERTION OF DIALYSIS CATHETER;  Surgeon: Elam Dutch, MD;  Location: [place];  Service: Vascular;  Laterality: N/A;  . IR PARACENTESIS  08/30/2017  . IR PARACENTESIS  09/29/2017  . IR PARACENTESIS  10/28/2017  . IR PARACENTESIS  11/09/2017  . IR PARACENTESIS  11/16/2017  . IR PARACENTESIS  11/28/2017  . IR PARACENTESIS  12/01/2017  . IR PARACENTESIS  12/06/2017  . IR PARACENTESIS  01/03/2018  . LEFT HEART CATH AND CORONARY ANGIOGRAPHY N/A 02/22/2017   Procedure: LEFT HEART CATH AND CORONARY ANGIOGRAPHY;  Surgeon: Nigel Mormon, MD;  Location: [place] [facility];  Service: Cardiovascular;  Laterality: N/A;  . LIGATION OF ARTERIOVENOUS  FISTULA Left 12/11/3760   Procedure:  Plication of Left Arm Arteriovenous Fistula;  Surgeon: Elam Dutch, MD;  Location: [place];  Service: Vascular;  Laterality: Left;  . POLYPECTOMY    . REVISON OF ARTERIOVENOUS FISTULA Left 03/12/5175   Procedure: PLICATION OF DISTAL ANEURYSMAL SEGEMENT OF LEFT UPPER ARM ARTERIOVENOUS FISTULA;  Surgeon: Elam Dutch, MD;  Location: [place];  Service: Vascular;  Laterality: Left;  . REVISON OF ARTERIOVENOUS FISTULA Left 1/60/7371   Procedure: Plication of Left Upper Arm Fistula ;  Surgeon: Waynetta Sandy, MD;  Location: [place];  Service: Vascular;  Laterality: Left;  . SKIN GRAFT SPLIT THICKNESS LEG / FOOT Left    SKIN GRAFT SPLIT THICKNESS LEFT ARM DONOR SITE: LEFT ANTERIOR THIGH  . SKIN SPLIT GRAFT Left 07/04/2017   Procedure: SKIN GRAFT SPLIT THICKNESS LEFT ARM DONOR SITE: LEFT ANTERIOR THIGH;  Surgeon: Elam Dutch, MD;  Location: [place];  Service: Vascular;  Laterality: Left;  . THROMBECTOMY W/ EMBOLECTOMY Left 06/05/2017   Procedure: EXPLORATION OF LEFT ARM FOR  BLEEDING; OVERSEWED PROXIMAL FISTULA;  Surgeon: Angelia Mould, MD;  Location: [place];  Service: Vascular;  Laterality: Left;  . WOUND EXPLORATION Left 06/03/2017   Procedure: WOUND EXPLORATION WITH WOUND VAC APPLICATION TO LEFT ARM;  Surgeon: Angelia Mould, MD;  Location: [place];  Service: Vascular;  Laterality: Left;    Social History:  reports that he has been smoking cigarettes.  He started smoking about 44 years ago. He has a 21.50 pack-year smoking history. He has never used smokeless tobacco. He reports that he drank alcohol. He reports that he has current or past drug history. Drug: Marijuana.  Family History:  Family History  Problem Relation Age of Onset  . Heart disease Mother   . Lung cancer Mother   . Heart disease Father   . Malignant hyperthermia Father   . COPD Father   . Throat cancer Sister   . Esophageal cancer Sister   . Hypertension Other   . COPD Other   . Colon cancer Neg  Hx   . Colon polyps Neg Hx   . Rectal cancer Neg Hx   . Stomach cancer Neg Hx      Prior to Admission medications   Medication Sig Start Date End Date Taking? Authorizing Provider  albuterol (VENTOLIN HFA) 108 (90 Base) MCG/ACT inhaler Inhale 2 puffs into the lungs every 6 (six) hours as needed for wheezing or shortness of breath.   Yes [provider]  budesonide-formoterol (SYMBICORT) 160-4.5 MCG/ACT inhaler Inhale 2 puffs into the lungs 2 (two) times daily.   Yes [provider]  ciprofloxacin (CIPRO) 500 MG tablet Take 500 mg by mouth at bedtime.   Yes [provider]  clopidogrel (PLAVIX) 75 MG tablet Take 75 mg by mouth daily.   Yes [provider]  diltiazem (CARDIZEM CD) 120 MG 24 hr capsule Take 1 capsule (120 mg total) by mouth daily. 06/23/17 06/23/18 Yes Shahmehdi, Seyed A, MD  Fluticasone-Salmeterol (ADVAIR) 250-50 MCG/DOSE AEPB Inhale 1 puff into the lungs 2 (two) times daily.   Yes [provider]  gabapentin (NEURONTIN) 100 MG capsule Take 100 mg by mouth 2 (two) times daily. 12/13/17  Yes [provider]  hydrALAZINE (APRESOLINE) 100 MG tablet Take 1 tablet by mouth twice a day take after HD on dialysis days 08/31/17  Yes [provider]  lactulose, encephalopathy, (CHRONULAC) 10 GM/15ML SOLN Take 15 ml's by mouth twice daily. 01/17/18  Yes Esterwood, Amy S, PA-C  oxyCODONE (OXY IR/ROXICODONE) 5 MG immediate release tablet Take 5 mg by mouth 2 (two) times daily as needed for pain. 12/13/17  Yes [provider]  sevelamer carbonate (RENVELA) 800 MG tablet Take 1,600-3,200 tablets by mouth See admin instructions. Take 4 tablets with meals  And 2 tablets twice daily with snacks 01/12/18  Yes [provider]    Physical Exam: Vitals:   01/21/18 0200 01/21/18 0215 01/21/18 0230 01/21/18 0245  BP: 109/87 104/66 109/68 125/71  Pulse: 62 60 64 (!) 59  Resp: 17 16 17 14   Temp:      TempSrc:      SpO2: 96% 95%  94% 96%  Weight:      Height:       General: Not in acute distress HEENT:       Eyes: PERRL, EOMI, no scleral icterus.       ENT: No discharge from the ears and nose, no pharynx injection, no tonsillar enlargement.        Neck:  positive JVD, no bruit, no mass felt. Heme: No neck lymph node enlargement. Cardiac: S1/S2, RRR, No murmurs, No gallops or rubs. Respiratory:  No rales, wheezing, rhonchi or rubs. GI: distended, diffusely tender, no rebound pain, no organomegaly, BS present. GU: No hematuria Ext: No pitting leg edema bilaterally. 2+DP/PT pulse bilaterally. Has functioning AV fistula in right arm (pt also has tunnel HD cath). Musculoskeletal: No joint deformities, No joint redness or warmth, no limitation of ROM in spin. Skin: No rashes.  Neuro: Alert, oriented X3, cranial nerves II-XII grossly intact, moves all extremities normally. Psych: Patient is not psychotic, no suicidal or hemocidal ideation.  Labs on Admission: I have personally reviewed following labs and imaging studies  CBC: Recent Labs  Lab 01/20/18 2214  WBC 8.7  HGB 10.2*  HCT 30.4*  MCV 80.6  PLT 98*   Basic Metabolic Panel: Recent Labs  Lab 01/20/18 2214  NA 134*  K 3.7  CL 93*  CO2 28  GLUCOSE 134*  BUN 15  CREATININE 5.09*  CALCIUM 8.6*   GFR: Estimated Creatinine Clearance: 15.9 mL/min (A) (by C-G formula based on SCr of 5.09 mg/dL (H)). Liver Function Tests: Recent Labs  Lab 01/20/18 2214  AST 24  ALT 11  ALKPHOS 189*  BILITOT 0.8  PROT 7.1  ALBUMIN 3.8   Recent Labs  Lab 01/20/18 2214  LIPASE 43   No results for input(s): AMMONIA in the last 168 hours. Coagulation Profile: No results for input(s): INR, PROTIME in the last 168 hours. Cardiac Enzymes: No results for input(s): CKTOTAL, CKMB, CKMBINDEX, TROPONINI in the last 168 hours. BNP (last 3 results) No results for input(s): PROBNP in the last 8760 hours. HbA1C: No results for input(s): HGBA1C in the last 72  hours. CBG: No results for input(s): GLUCAP in the last 168 hours. Lipid Profile: No results for input(s): CHOL, HDL, LDLCALC, TRIG, CHOLHDL, LDLDIRECT in the last 72 hours. Thyroid Function Tests: No results for input(s): TSH, T4TOTAL, FREET4, T3FREE, THYROIDAB in the last 72 hours. Anemia Panel: No results for input(s): VITAMINB12, FOLATE, FERRITIN, TIBC, IRON, RETICCTPCT in the last 72 hours. Urine analysis:    Component Value Date/Time   COLORURINE YELLOW 07/16/2011 1619   APPEARANCEUR CLEAR 07/16/2011 1619   LABSPEC 1.018 07/16/2011 1619   PHURINE 7.5 07/16/2011 1619   GLUCOSEU 250 (A) 07/16/2011 1619   HGBUR MODERATE (A) 07/16/2011 1619   BILIRUBINUR SMALL (A) 07/16/2011 1619   KETONESUR NEGATIVE 07/16/2011 1619   PROTEINUR >300 (A) 07/16/2011 1619   UROBILINOGEN 1.0 07/16/2011 1619   NITRITE NEGATIVE 07/16/2011 1619   LEUKOCYTESUR SMALL (A) 07/16/2011 1619   Sepsis Labs: @LABRCNTIP (procalcitonin:4,lacticidven:4) )No results found for this or any previous visit (from the past 240 hour(s)).   Radiological Exams on Admission: No results found.   EKG: Independently reviewed. Initial EKG showed wide complex tachycardia, heart rate has slowed down after Cardizem drip started, repeated EKG showed atrial flutter with RVR, left bundle blockade which is old.  Assessment/Plan Principal Problem:   Abdominal pain Active Problems:   Cigarette smoker   Thrombocytopenia (HCC)   Essential hypertension   COPD (chronic obstructive pulmonary disease) (HCC)   History of Cocaine abuse ([place])   Anemia due to chronic kidney disease   Atrial flutter with rapid ventricular response (HCC)   ESRD on dialysis ([place])   SBP (spontaneous bacterial peritonitis) ([place])   Liver cirrhosis (HCC)   Abdominal pain: it is very concerning for SBP given hx of liver cirrhosis and worsening ascites.  No fever or leukocytosis, clinically not septic now. Pt has hx of cocaine abuse, periodically should avoid  using IV narcotics, but the patient has true severe abdominal pain now, will temporarily order IV Dilaudid for 3 dose as needed.   -will admit to SDU as inpt -Abx:  Fortaz IV -give 100 g of albumin -Blood culture x2 -IVF: will not give IVF due to ESRD and normal lactic acid -prn oxycodone for pain and zofran for nausea -prn dilaudid IV -please call IR in morning for paracentesis  Atrial flutter with RVR: CHA2DS2-VASc Score is 2, needs oral anticoagulation, but pt is not good candidate for [facility] due high risk of bleeding 2/2 liver cirrhosis and thrombocytopenia.  Patient does not have any abdominal pain or shortness of breath.  Likely triggered by abdominal pain and possible SBP. -IV Cardizem gtt started -continue home oral cardizem -check TSH  Tobacco abuse  -Nicotine patch  History of Cocaine abuse: -check UDS  HTN:  -Continue home medications: Cardizem, hydralazine -IV hydralazine prn  COPD (chronic obstructive pulmonary disease) ([place]): stable. -Duonebs inhaler and prn albuterol nebs  Anemia due to chronic kidney disease: hgb stable.  Hemoglobin 10.2 -f/u by CBC  ESRD (end stage renal disease) on dialysis (MWF):  had HD on Friday. potassium 3.7, bicarbonate 28, creatinine 5.09, BUN 15 -please call renal for HD in AM.  Liver cirrhosis ([place]): Mental status normal -continue home lactulose 10 mg twice daily -check ammonia level   DVT ppx: SCD Code Status: Full code Family Communication: None at bed side.   Disposition Plan:  Anticipate discharge back to previous home environment Consults called:  none Admission status:SDU/inpation       Date of Service 01/21/2018    Ivor Costa [facility] Pager [phone]  If 7PM-7AM, please contact night-coverage [email or web address] Password [email or web address] 01/21/2018, 3:03 AM

## 2018-01-21 NOTE — Progress Notes (Signed)
[facility] PROGRESS NOTE  Decorian Schuenemann  YQM:[id number] DOB: [birth year] DOA: 01/20/2018 PCP: [name], MD Outpatient Specialists: GI, Pyrtle Brief Narrative: [name] is a 57 y.o. male with a history of chronic liver failure due to cirrhosis with history of alcoholism and abuse, chronic Hep B and Hep C, recurrent ascites and SBP May 2019, AFib/flutter, and ESRD among others who presented to the ED with increased abdominal girth and associated pain over the past couple weeks. Found to have AFib/flutter with RVR without attributable symptoms. He was admitted this morning with coverage for SBP pending paracentesis.  Subjective: Itching all over, no nausea or vomiting, eating well. Abdominal pain is stable, improved with dilaudid.   Objective: BP 115/69   Pulse (!) 126   Temp 97.7 F (36.5 C)   Resp (!) 21   Ht 5\' 9"  (1.753 m)   Wt 68.2 kg (150 lb 4.8 oz)   SpO2 95%   BMI 22.20 kg/m   Gen: Chronically ill-appearing male in no distress HEENT: Anicteric sclerae Pulm: Clear and nonlabored on room air  CV: Irreg with rate in 80's, no murmur. + JVD, no edema GI: Soft, distended with +fluid wave, mildly tender. Not warm or erythematous. +BS  Neuro: Alert and oriented. No focal deficits. Ext: Warm, no deformities Skin: Diffuse scratch marks without excoriation. No jaundice or wounds.  Assessment & Plan: Principal Problem:   Abdominal pain Active Problems:   Cigarette smoker   Thrombocytopenia (HCC)   Essential hypertension   COPD (chronic obstructive pulmonary disease) (HCC)   History of Cocaine abuse ([place])   Anemia due to chronic kidney disease   Atrial flutter with rapid ventricular response (HCC)   ESRD on dialysis (HCC)   SBP (spontaneous bacterial peritonitis) ([place])   Liver cirrhosis (HCC)  Alcoholic cirrhosis with ascites: - Not encephalopathic, will continue home lactulose   Atrial fibrillation/flutter with RVR: Quick improvement with  diltiazem gtt. No HF symptoms. - Convert to home po diltiazem and monitor - On plavix for this? Not on anticoagulation due to cirrhosis, thrombocytopenia. CHA2DS2-VASc score is 2.  Concern for SBP: With hx SBP May 2019 now on chronic cipro ppx - Diagnostic labs to be sent with paracentesis. Without fever, leukocytosis, mental status changes, and not severe tenderness to palpation, we may be able to stop emiric abx depending on labs.  - Continue ceftazidime for now, blood cultures sent. - Given albumin  ESRD: Has tolerated routine HD MWF through HD cath (has RUE AVF and nonfunctioning LUE AVF) - Will alert nephrology to admission if planning to remain inpatient past tomorrow. No indications for urgent HD - Continue home medications  COPD: No exacerbation - Continue home ICS-LABA  HTN:  - Continue home medications  Anemia of chronic disease:  - Has EGD, colonoscopy rescheduled for 7/16 with Dr. Hilarie Fredrickson per pt.   Alcohol abuse, hx cocaine use: In reported remission.  - Monitor in SDU - UDS  Tobacco use:  - Cessation counseling provided, precontemplative - Nicotine patch  Patrecia Pour, MD [facility] [email or web address] Password [facility] 01/21/2018, 10:25 AM

## 2018-01-21 NOTE — Progress Notes (Signed)
Patient arrived to unit [place] bed 12 from emergency room. Assisted to bed by nursing staff. Patient denies pain or discomfort at present time.Placed patient on telemetry showing atrial flutter control rate . Denies shortness of breath.Patient scored moderate  fall risk educated patient to call for help when getting up.Patient only allowing staff to place one side rail up.Patient stated,"I'm not going to fall out of bed." Educated patient to nurse call bell and phone.Will continue to monitor.

## 2018-01-21 NOTE — Progress Notes (Signed)
Pharmacy Antibiotic Note  Pepe Mineau is a 57 y.o. male admitted on 01/20/2018 with SBP.  Pharmacy has been consulted for ceftazidime dosing.  Plan: Fortaz 2gm IV x 1 then after each HD F/u cultures and clinical course  Height: 5\' 9"  (175.3 cm) Weight: 151 lb 0.2 oz (68.5 kg) IBW/kg (Calculated) : 70.7  Temp (24hrs), Avg:97.7 F (36.5 C), Min:97.7 F (36.5 C), Max:97.7 F (36.5 C)  Recent Labs  Lab 01/20/18 2214  WBC 8.7  CREATININE 5.09*    Estimated Creatinine Clearance: 15.9 mL/min (A) (by C-G formula based on SCr of 5.09 mg/dL (H)).    Allergies  Allergen Reactions  . Morphine And Related Other (See Comments)    Stomach pain  . Aspirin Other (See Comments)    STOMACH PAIN  . Clonidine Derivatives Itching  . Tramadol Itching  . Tylenol [Acetaminophen] Nausea Only    Stomach ache    Thank you for allowing pharmacy to be a part of this patient's care.  Excell Seltzer Poteet 01/21/2018 3:02 AM

## 2018-01-21 NOTE — Progress Notes (Signed)
Patient pulling on hangnail on right hand middle  Finger and pulled part of nailbed off.Patient nail bed bleeding.Cleaned with sterile saline wipes and band aid applied.

## 2018-01-21 NOTE — Progress Notes (Signed)
Patient refusing to have morning labs collected at this time.Patient stated," I need to sleep some first.Lab can come back later in the morning" Will reschedule morning lab work to 0800.

## 2018-01-22 DIAGNOSIS — N186 End stage renal disease: Secondary | ICD-10-CM

## 2018-01-22 DIAGNOSIS — D696 Thrombocytopenia, unspecified: Secondary | ICD-10-CM

## 2018-01-22 DIAGNOSIS — K652 Spontaneous bacterial peritonitis: Secondary | ICD-10-CM

## 2018-01-22 DIAGNOSIS — F17210 Nicotine dependence, cigarettes, uncomplicated: Secondary | ICD-10-CM

## 2018-01-22 DIAGNOSIS — N189 Chronic kidney disease, unspecified: Secondary | ICD-10-CM

## 2018-01-22 DIAGNOSIS — I10 Essential (primary) hypertension: Secondary | ICD-10-CM

## 2018-01-22 DIAGNOSIS — K7031 Alcoholic cirrhosis of liver with ascites: Principal | ICD-10-CM

## 2018-01-22 DIAGNOSIS — D631 Anemia in chronic kidney disease: Secondary | ICD-10-CM

## 2018-01-22 DIAGNOSIS — I4892 Unspecified atrial flutter: Secondary | ICD-10-CM

## 2018-01-22 DIAGNOSIS — F1410 Cocaine abuse, uncomplicated: Secondary | ICD-10-CM

## 2018-01-22 DIAGNOSIS — J42 Unspecified chronic bronchitis: Secondary | ICD-10-CM

## 2018-01-22 DIAGNOSIS — Z992 Dependence on renal dialysis: Secondary | ICD-10-CM

## 2018-01-22 LAB — PH, BODY FLUID: pH, Body Fluid: 7.5

## 2018-01-22 MED ORDER — SEVELAMER CARBONATE 800 MG PO TABS: ORAL_TABLET | ORAL | Status: DC

## 2018-01-22 NOTE — Progress Notes (Signed)
Pt discharged to home. AVS given, discussed follow up and medications with patient. IV removed. Pt in stable condition for discharge

## 2018-01-22 NOTE — Discharge Summary (Signed)
Physician Discharge Summary  [name] POE:[id number] DOB: 1961/06/24 DOA: 01/20/2018  PCP: [name], MD  Admit date: 01/20/2018 Discharge date: 01/22/2018  Admitted From: Home Disposition: Home   Recommendations for Outpatient Follow-up:  1. Follow up with PCP in 1-2 weeks 2. Please monitor CBC, CMP in 1 week 3. Follow up for EGD and colonoscopy as scheduled with GI this week. 4. Consider scheduling paracenteses given recurrent nature of ascites. 5. Continued efforts to counsel regarding alcohol and tobacco cessation 6. Follow up peritoneal fluid cytology (pending at discharge).   Home Health: None Equipment/Devices: None new Discharge Condition: Stable CODE STATUS: Full Diet recommendation: Renal  Brief/Interim Summary: [name] is a 57 y.o. male with a history of chronic liver failure due to cirrhosis with history of alcoholism and abuse, chronic Hep B and Hep C, recurrent ascites and SBP May 2019, AFib/flutter, and ESRD among others who presented to the ED with increased abdominal girth and associated pain over the past couple weeks. Found to have AFib/flutter with RVR without attributable symptoms. He was admitted with coverage for SBP pending paracentesis. Following 2.1L paracentesis 7/13 abdominal pain has improved. He has remained afebrile without leukocytosis and peritoneal fluid analysis is not consistent with SBP. He is stable for discharge and needs no refills on medications.   Discharge Diagnoses:  Principal Problem:   Abdominal pain Active Problems:   Cigarette smoker   Thrombocytopenia (HCC)   Essential hypertension   COPD (chronic obstructive pulmonary disease) (HCC)   History of Cocaine abuse ([place])   Anemia due to chronic kidney disease   Atrial flutter with rapid ventricular response (HCC)   ESRD on dialysis ([place])   SBP (spontaneous bacterial peritonitis) ([place])   Liver cirrhosis (HCC)  Alcoholic cirrhosis with ascites: - Not  encephalopathic, will continue home lactulose (ran out at home, but has refills at pharmacy which is open today)  Atrial fibrillation/flutter with RVR: Quick improvement with diltiazem gtt and then home po cardizem. No HF symptoms. Telemetry review during this admission shows intermittent sinus tachycardia with occasional dropped beats and AFib and AFlutter with variable, usually 4:1 conduction.  - Continue home dilt - On plavix for this? Not on anticoagulation due to cirrhosis, thrombocytopenia. CHA2DS2-VASc score is 2.  Abdominal pain: With hx SBP May 2019 now on chronic cipro ppx. Initial concern for recurrent SBP though no without fever, leukocytosis, mental status changes, significant WBC or organisms seen on peritoneal fluid analysis cell count and gram stain, as well as significant improvement in pain following procedure (2.1L off 7/13), we will stop Tx and return to ppx abx.   ESRD: Has tolerated routine HD MWF through HD cath (has RUE AVF and nonfunctioning LUE AVF) - Continue routine HD. No indications for urgent HD - Continue sevelamer (dose checked)  COPD: No exacerbation - Continue home ICS-LABA  HTN:  - Continue home medications  Anemia of chronic disease:  - Has EGD, colonoscopy rescheduled for 7/16 with Dr. Hilarie Fredrickson per pt.   Alcohol abuse, hx cocaine use: In reported remission.  - Never provided urine sample for UDS.  Tobacco use:  - Cessation counseling provided, precontemplative - Nicotine patch  Discharge Instructions Discharge Instructions    Diet - low sodium heart healthy   Complete by:  As directed    Discharge instructions   Complete by:  As directed    You were admitted for abdominal pain and had 2.1 liters of fluid drained from the abdomen. Abdominal pain has improved and there  is no evidence of infection of the fluid from your abdomen. You are stable for discharge and will need to continue taking all medications as you were and follow up with your  GI doctor as scheduled in addition to your primary doctor.   Seek medical attention right away if your symptoms worsen.   Increase activity slowly   Complete by:  As directed      Allergies as of 01/22/2018      Reactions   Morphine And Related Other (See Comments)   Stomach pain   Aspirin Other (See Comments)   STOMACH PAIN   Clonidine Derivatives Itching   Tramadol Itching   Tylenol [acetaminophen] Nausea Only   Stomach ache      Medication List    STOP taking these medications   Fluticasone-Salmeterol 250-50 MCG/DOSE Aepb Commonly known as:  ADVAIR     TAKE these medications   budesonide-formoterol 160-4.5 MCG/ACT inhaler Commonly known as:  SYMBICORT Inhale 2 puffs into the lungs 2 (two) times daily.   ciprofloxacin 500 MG tablet Commonly known as:  CIPRO Take 500 mg by mouth at bedtime.   clopidogrel 75 MG tablet Commonly known as:  PLAVIX Take 75 mg by mouth daily.   diltiazem 120 MG 24 hr capsule Commonly known as:  CARDIZEM CD Take 1 capsule (120 mg total) by mouth daily.   gabapentin 100 MG capsule Commonly known as:  NEURONTIN Take 100 mg by mouth 2 (two) times daily.   hydrALAZINE 100 MG tablet Commonly known as:  APRESOLINE Take 1 tablet by mouth twice a day take after HD on dialysis days   lactulose (encephalopathy) 10 GM/15ML Soln Commonly known as:  CHRONULAC Take 15 ml's by mouth twice daily.   oxyCODONE 5 MG immediate release tablet Commonly known as:  Oxy IR/ROXICODONE Take 5 mg by mouth 2 (two) times daily as needed for pain.   sevelamer carbonate 800 MG tablet Commonly known as:  RENVELA Take 4 tablets with meals  And 2 tablets twice daily with snacks What changed:    how much to take  how to take this  when to take this   VENTOLIN HFA 108 (90 Base) MCG/ACT inhaler Generic drug:  albuterol Inhale 2 puffs into the lungs every 6 (six) hours as needed for wheezing or shortness of breath.      Follow-up Information     [name], MD Follow up.   Specialty:  Internal Medicine Contact information: [address] [place] [address] [place], [facility] .   Contact information: [place] [address] [phone]        Jerene Bears, MD Follow up.   Specialty:  Gastroenterology Contact information: [address] [address] [phone]          Allergies  Allergen Reactions  . Morphine And Related Other (See Comments)    Stomach pain  . Aspirin Other (See Comments)    STOMACH PAIN  . Clonidine Derivatives Itching  . Tramadol Itching  . Tylenol [Acetaminophen] Nausea Only    Stomach ache    Consultations:  None  Procedures/Studies: Dg Chest 2 View  Result Date: 12/30/2017 CLINICAL DATA:  57 year old male with increasing shortness of breath for 2 days. Dialysis today. Wheezing. EXAM: CHEST - 2 VIEW COMPARISON:  Chest and abdominal series 12/29/2017 and earlier. FINDINGS: Stable right chest dual lumen dialysis catheter. Stable cardiomegaly and mediastinal contours.  Stable lung volumes. Pulmonary vascularity appears stable without pulmonary edema. No pneumothorax. No pleural effusion. There is streaky increased lower lobe opacity on the lateral view. No consolidation. Visualized tracheal air column is within normal limits. Negative visible bowel gas pattern. Bony sclerosis likely reflecting renal osteodystrophy. IMPRESSION: 1. Nonspecific mild streaky lower lobe opacity. If there are signs and symptoms of infection consider developing pneumonia. 2. No pleural effusion or other acute cardiopulmonary abnormality. 3. Stable cardiomegaly. Electronically Signed   By: Genevie Ann M.D.   On: 12/30/2017 22:18   Dg Chest 2 View  Result Date: 12/23/2017 CLINICAL DATA:  Chest pain EXAM: CHEST - 2 VIEW COMPARISON:  Chest x-rays dated 12/19/2017 and 12/07/2017. FINDINGS: Stable cardiomegaly. Aortic atherosclerosis.  Central pulmonary vascular congestion is stable compared to the most recent exam, with associated mild bilateral interstitial edema. No confluent opacity to suggest a developing pneumonia. No pleural effusion or pneumothorax seen. No acute or suspicious osseous finding. Central catheter is stable in position with tip overlying the RIGHT atrium. IMPRESSION: 1. Stable cardiomegaly with central pulmonary vascular congestion and mild bilateral interstitial edema, consistent with persistent mild CHF. 2. No evidence of pneumonia. 3. Aortic atherosclerosis. Electronically Signed   By: Franki Cabot M.D.   On: 12/23/2017 19:10   Ct Abdomen Pelvis W Contrast  Result Date: 01/02/2018 CLINICAL DATA:  57 year old male with lower abdominal pain, nausea and diarrhea. EXAM: CT ABDOMEN AND PELVIS WITH CONTRAST TECHNIQUE: Multidetector CT imaging of the abdomen and pelvis was performed using the standard protocol following bolus administration of intravenous contrast. CONTRAST:  123mL OMNIPAQUE IOHEXOL 300 MG/ML  SOLN COMPARISON:  Abdominal CT dated 12/07/2017 FINDINGS: Lower chest: The visualized lung bases are clear. There is moderate cardiomegaly. Coronary vascular calcification as well as calcification of the aortic valve. No intra-abdominal free air. Moderate ascites, slightly increased since the prior CT. Hepatobiliary: Morphologic changes of cirrhosis. The gallbladder is partially contracted. No calcified gallstone. Pancreas: Unremarkable. No pancreatic ductal dilatation or surrounding inflammatory changes. Spleen: Normal in size without focal abnormality. Adrenals/Urinary Tract: The adrenal glands are unremarkable. Atrophic kidneys. Multiple bilateral renal hypodense lesions which are not characterized. No hydronephrosis. The urinary bladder is collapsed. Stomach/Bowel: There is no bowel obstruction. The appendix is normal as visualized. Vascular/Lymphatic: Advanced aortoiliac atherosclerotic disease. No portal venous  gas. No adenopathy. Reproductive: The prostate and seminal vesicles are grossly unremarkable. Other: None Musculoskeletal: Diffuse osseous sclerosis consistent with renal osteodystrophy. No acute fracture. IMPRESSION: 1. Cirrhosis with increased ascites. 2. Chronic renal failure with findings of renal osteodystrophy. 3. No bowel obstruction. 4. Cardiomegaly with coronary vascular calcification. 5.  Aortic Atherosclerosis (ICD10-I70.0). Electronically Signed   By: Anner Crete M.D.   On: 01/02/2018 05:07   US Paracentesis  Result Date: 01/21/2018 INDICATION: History of alcoholic cirrhosis. Recurrent ascites. Request for diagnostic and therapeutic paracentesis. EXAM: ULTRASOUND GUIDED LEFT LOWER QUADRANT PARACENTESIS MEDICATIONS: None. COMPLICATIONS: None immediate. PROCEDURE: Informed written consent was obtained from the patient after a discussion of the risks, benefits and alternatives to treatment. A timeout was performed prior to the initiation of the procedure. Initial ultrasound scanning demonstrates a large amount of ascites within the left lower abdominal quadrant. The left lower abdomen was prepped and draped in the usual sterile fashion. 1% lidocaine with epinephrine was used for local anesthesia. Following this, a 19 gauge, 7-cm, Yueh catheter was introduced. An ultrasound image was saved for documentation purposes. The paracentesis was performed. The catheter was removed and a dressing was applied. The patient tolerated the  procedure well without immediate post procedural complication. FINDINGS: A total of approximately 2.1 L of clear yellow fluid was removed. Samples were sent to the laboratory as requested by the clinical team. IMPRESSION: Successful ultrasound-guided paracentesis yielding 2.1 liters of peritoneal fluid. Read by: Ascencion Dike PA-C Electronically Signed   By: Jacqulynn Cadet M.D.   On: 01/21/2018 12:27   Dg Abdomen Acute W/chest  Result Date: 12/29/2017 CLINICAL DATA:   Acute on chronic abdominal pain and distention. End-stage renal disease, last dialysis yesterday. EXAM: DG ABDOMEN ACUTE W/ 1V CHEST COMPARISON:  Chest 12/23/2017 FINDINGS: Shallow inspiration with elevation of the left hemidiaphragm and mild linear atelectasis in the left lung base. Cardiac enlargement with mild central vascular congestion. No edema or consolidation. No blunting of costophrenic angles. No pneumothorax. Right central venous catheter with tip over the right atrium. Calcification of the aorta. Gas-filled central mid abdominal small and large bowel with mild distention. There a few air-fluid levels on the upright view. This could represent early small bowel obstruction or enteritis. No free air. No radiopaque stones. Extensive vascular calcifications in the abdomen. Probable surgical clip in the pelvis without change. Degenerative changes in the spine and hips. IMPRESSION: 1. Cardiac enlargement with mild central vascular congestion. No edema or consolidation. 2. Gas-filled mildly distended central abdominal bowel with a few air-fluid levels suggesting early obstruction or enteritis. 3. Extensive vascular calcifications. Electronically Signed   By: Lucienne Capers M.D.   On: 12/29/2017 21:23   Ir Paracentesis  Result Date: 01/03/2018 INDICATION: Recurrent symptomatic ascites. Please perform ultrasound-guided paracentesis for diagnostic and therapeutic purposes. EXAM: ULTRASOUND-GUIDED PARACENTESIS COMPARISON:  Multiple previous ultrasound-guided paracenteses, most recently on 12/19/2017; CT abdomen and pelvis - 01/02/2018 MEDICATIONS: None. COMPLICATIONS: None immediate. TECHNIQUE: Informed written consent was obtained from the patient after a discussion of the risks, benefits and alternatives to treatment. A timeout was performed prior to the initiation of the procedure. Initial ultrasound scanning demonstrates a moderate amount of ascites within the right lower abdominal quadrant. The right  lower abdomen was prepped and draped in the usual sterile fashion. 1% lidocaine with epinephrine was used for local anesthesia. A 19 gauge, 7-cm, Yueh catheter was introduced. An ultrasound image was saved for documentation purposed. The paracentesis was performed. The catheter was removed and a dressing was applied. The patient tolerated the procedure well without immediate post procedural complication. FINDINGS: A total of approximately 2.1 liters of serous fluid was removed. Samples were sent to the laboratory as requested by the clinical team. IMPRESSION: Successful ultrasound-guided paracentesis yielding 2.1 liters of peritoneal fluid. Electronically Signed   By: Sandi Mariscal M.D.   On: 01/03/2018 14:46   Subjective: Abdominal pain improved, tolerating diet, ambulating. Wants to go home. Continues to have no fevers, chills.  Discharge Exam: Vitals:   01/22/18 0807 01/22/18 0913  BP: (!) 140/92 (!) 140/92  Pulse: 98 (!) 117  Resp:  19  Temp: 98.3 F (36.8 C)   SpO2: 98% 100%   General: Pt is alert, awake, not in acute distress Cardiovascular: RRR, S1/S2 +, no rubs, no gallops Respiratory: CTA bilaterally, no wheezing, no rhonchi Abdominal: Soft, mildly distended (significantly improved). Not tender +BS Extremities: No edema, no cyanosis  Labs: BNP (last 3 results) Recent Labs    08/20/17 1455 08/29/17 1235 12/23/17 1815  BNP 1,418.4* 943.6* 3,220.2*   Basic Metabolic Panel: Recent Labs  Lab 01/20/18 2214 01/21/18 0500  NA 134* 134*  K 3.7 4.3  CL 93* 91*  CO2 28  29  GLUCOSE 134* 92  BUN 15 17  CREATININE 5.09* 5.30*  CALCIUM 8.6* 8.8*   Liver Function Tests: Recent Labs  Lab 01/20/18 2214  AST 24  ALT 11  ALKPHOS 189*  BILITOT 0.8  PROT 7.1  ALBUMIN 3.8   Recent Labs  Lab 01/20/18 2214  LIPASE 43   No results for input(s): AMMONIA in the last 168 hours. CBC: Recent Labs  Lab 01/20/18 2214 01/21/18 0500  WBC 8.7 6.6  HGB 10.2* 9.8*  HCT 30.4*  29.8*  MCV 80.6 80.8  PLT 98* 68*   Cardiac Enzymes: No results for input(s): CKTOTAL, CKMB, CKMBINDEX, TROPONINI in the last 168 hours. BNP: Invalid input(s): POCBNP CBG: No results for input(s): GLUCAP in the last 168 hours. D-Dimer No results for input(s): DDIMER in the last 72 hours. Hgb A1c No results for input(s): HGBA1C in the last 72 hours. Lipid Profile No results for input(s): CHOL, HDL, LDLCALC, TRIG, CHOLHDL, LDLDIRECT in the last 72 hours. Thyroid function studies Recent Labs    01/21/18 0500  TSH 1.654   Anemia work up No results for input(s): VITAMINB12, FOLATE, FERRITIN, TIBC, IRON, RETICCTPCT in the last 72 hours. Urinalysis    Component Value Date/Time   COLORURINE YELLOW 07/16/2011 1619   APPEARANCEUR CLEAR 07/16/2011 1619   LABSPEC 1.018 07/16/2011 1619   PHURINE 7.5 07/16/2011 1619   GLUCOSEU 250 (A) 07/16/2011 1619   HGBUR MODERATE (A) 07/16/2011 1619   BILIRUBINUR SMALL (A) 07/16/2011 1619   KETONESUR NEGATIVE 07/16/2011 1619   PROTEINUR >300 (A) 07/16/2011 1619   UROBILINOGEN 1.0 07/16/2011 1619   NITRITE NEGATIVE 07/16/2011 1619   LEUKOCYTESUR SMALL (A) 07/16/2011 1619    Microbiology Recent Results (from the past 240 hour(s))  MRSA PCR Screening     Status: None   Collection Time: 01/21/18  7:40 AM  Result Value Ref Range Status   MRSA by PCR NEGATIVE NEGATIVE Final    Comment:        The GeneXpert MRSA Assay (FDA approved for NASAL specimens only), is one component of a comprehensive MRSA colonization surveillance program. It is not intended to diagnose MRSA infection nor to guide or monitor treatment for MRSA infections. Performed at [facility], [place] [address], [place], [place] [address]   Gram stain     Status: None   Collection Time: 01/21/18 12:05 PM  Result Value Ref Range Status   Specimen Description PERITONEAL  Final   Special Requests NONE  Final   Gram Stain   Final    RARE WBC PRESENT, PREDOMINANTLY  MONONUCLEAR NO ORGANISMS SEEN Performed at [facility], [address], [place], [place] [address]    Report Status 01/21/2018 FINAL  Final    Time coordinating discharge: Approximately 40 minutes  Patrecia Pour, MD  [facility] 01/22/2018, 10:19 AM Pager [phone]

## 2018-01-23 ENCOUNTER — Other Ambulatory Visit: Payer: Self-pay

## 2018-01-23 ENCOUNTER — Observation Stay (HOSPITAL_COMMUNITY)
Admission: EM | Admit: 2018-01-23 | Discharge: 2018-01-25 | Discharge status: Against Medical Advice | Payer: Medicare Other | Attending: Internal Medicine | Admitting: Internal Medicine

## 2018-01-23 ENCOUNTER — Emergency Department (HOSPITAL_COMMUNITY): Payer: Medicare Other

## 2018-01-23 ENCOUNTER — Encounter (HOSPITAL_COMMUNITY): Payer: Self-pay | Admitting: Emergency Medicine

## 2018-01-23 DIAGNOSIS — Z955 Presence of coronary angioplasty implant and graft: Secondary | ICD-10-CM | POA: Insufficient documentation

## 2018-01-23 DIAGNOSIS — J449 Chronic obstructive pulmonary disease, unspecified: Secondary | ICD-10-CM | POA: Insufficient documentation

## 2018-01-23 DIAGNOSIS — K2970 Gastritis, unspecified, without bleeding: Secondary | ICD-10-CM | POA: Insufficient documentation

## 2018-01-23 DIAGNOSIS — K621 Rectal polyp: Secondary | ICD-10-CM | POA: Insufficient documentation

## 2018-01-23 DIAGNOSIS — K7030 Alcoholic cirrhosis of liver without ascites: Secondary | ICD-10-CM

## 2018-01-23 DIAGNOSIS — I5032 Chronic diastolic (congestive) heart failure: Secondary | ICD-10-CM | POA: Diagnosis not present

## 2018-01-23 DIAGNOSIS — K7031 Alcoholic cirrhosis of liver with ascites: Secondary | ICD-10-CM | POA: Insufficient documentation

## 2018-01-23 DIAGNOSIS — K625 Hemorrhage of anus and rectum: Secondary | ICD-10-CM | POA: Insufficient documentation

## 2018-01-23 DIAGNOSIS — F1010 Alcohol abuse, uncomplicated: Secondary | ICD-10-CM | POA: Insufficient documentation

## 2018-01-23 DIAGNOSIS — G8929 Other chronic pain: Principal | ICD-10-CM | POA: Insufficient documentation

## 2018-01-23 DIAGNOSIS — D631 Anemia in chronic kidney disease: Secondary | ICD-10-CM

## 2018-01-23 DIAGNOSIS — Z992 Dependence on renal dialysis: Secondary | ICD-10-CM | POA: Insufficient documentation

## 2018-01-23 DIAGNOSIS — D128 Benign neoplasm of rectum: Secondary | ICD-10-CM

## 2018-01-23 DIAGNOSIS — R188 Other ascites: Secondary | ICD-10-CM

## 2018-01-23 DIAGNOSIS — R1084 Generalized abdominal pain: Secondary | ICD-10-CM | POA: Diagnosis not present

## 2018-01-23 DIAGNOSIS — R109 Unspecified abdominal pain: Secondary | ICD-10-CM

## 2018-01-23 DIAGNOSIS — Z7902 Long term (current) use of antithrombotics/antiplatelets: Secondary | ICD-10-CM | POA: Insufficient documentation

## 2018-01-23 DIAGNOSIS — E871 Hypo-osmolality and hyponatremia: Secondary | ICD-10-CM | POA: Diagnosis not present

## 2018-01-23 DIAGNOSIS — I4891 Unspecified atrial fibrillation: Secondary | ICD-10-CM | POA: Diagnosis not present

## 2018-01-23 DIAGNOSIS — K648 Other hemorrhoids: Secondary | ICD-10-CM | POA: Insufficient documentation

## 2018-01-23 DIAGNOSIS — Z860100 Personal history of colon polyps, unspecified: Secondary | ICD-10-CM

## 2018-01-23 DIAGNOSIS — E875 Hyperkalemia: Secondary | ICD-10-CM

## 2018-01-23 DIAGNOSIS — I10 Essential (primary) hypertension: Secondary | ICD-10-CM

## 2018-01-23 DIAGNOSIS — E8770 Fluid overload, unspecified: Secondary | ICD-10-CM | POA: Diagnosis not present

## 2018-01-23 DIAGNOSIS — M25511 Pain in right shoulder: Secondary | ICD-10-CM | POA: Insufficient documentation

## 2018-01-23 DIAGNOSIS — K319 Disease of stomach and duodenum, unspecified: Secondary | ICD-10-CM | POA: Diagnosis not present

## 2018-01-23 DIAGNOSIS — B181 Chronic viral hepatitis B without delta-agent: Secondary | ICD-10-CM | POA: Diagnosis not present

## 2018-01-23 DIAGNOSIS — D122 Benign neoplasm of ascending colon: Secondary | ICD-10-CM

## 2018-01-23 DIAGNOSIS — D638 Anemia in other chronic diseases classified elsewhere: Secondary | ICD-10-CM | POA: Insufficient documentation

## 2018-01-23 DIAGNOSIS — E1151 Type 2 diabetes mellitus with diabetic peripheral angiopathy without gangrene: Secondary | ICD-10-CM | POA: Insufficient documentation

## 2018-01-23 DIAGNOSIS — D649 Anemia, unspecified: Secondary | ICD-10-CM | POA: Diagnosis present

## 2018-01-23 DIAGNOSIS — Z79899 Other long term (current) drug therapy: Secondary | ICD-10-CM | POA: Insufficient documentation

## 2018-01-23 DIAGNOSIS — N186 End stage renal disease: Secondary | ICD-10-CM | POA: Diagnosis not present

## 2018-01-23 DIAGNOSIS — D696 Thrombocytopenia, unspecified: Secondary | ICD-10-CM | POA: Diagnosis present

## 2018-01-23 DIAGNOSIS — I480 Paroxysmal atrial fibrillation: Secondary | ICD-10-CM

## 2018-01-23 DIAGNOSIS — D124 Benign neoplasm of descending colon: Secondary | ICD-10-CM

## 2018-01-23 DIAGNOSIS — B182 Chronic viral hepatitis C: Secondary | ICD-10-CM

## 2018-01-23 DIAGNOSIS — J42 Unspecified chronic bronchitis: Secondary | ICD-10-CM

## 2018-01-23 DIAGNOSIS — F17210 Nicotine dependence, cigarettes, uncomplicated: Secondary | ICD-10-CM | POA: Insufficient documentation

## 2018-01-23 DIAGNOSIS — I2510 Atherosclerotic heart disease of native coronary artery without angina pectoris: Secondary | ICD-10-CM | POA: Insufficient documentation

## 2018-01-23 DIAGNOSIS — D120 Benign neoplasm of cecum: Secondary | ICD-10-CM

## 2018-01-23 DIAGNOSIS — I132 Hypertensive heart and chronic kidney disease with heart failure and with stage 5 chronic kidney disease, or end stage renal disease: Secondary | ICD-10-CM | POA: Insufficient documentation

## 2018-01-23 DIAGNOSIS — I252 Old myocardial infarction: Secondary | ICD-10-CM | POA: Insufficient documentation

## 2018-01-23 HISTORY — PX: IR PARACENTESIS: IMG2679

## 2018-01-23 LAB — I-STAT TROPONIN, ED: Troponin i, poc: 0.14 ng/mL (ref 0.00–0.08)

## 2018-01-23 LAB — CBC
HCT: 31.1 % — ABNORMAL LOW (ref 39.0–52.0)
Hemoglobin: 10.5 g/dL — ABNORMAL LOW (ref 13.0–17.0)
MCH: 27.1 pg (ref 26.0–34.0)
MCHC: 33.8 g/dL (ref 30.0–36.0)
MCV: 80.2 fL (ref 78.0–100.0)
Platelets: 89 10*3/uL — ABNORMAL LOW (ref 150–400)
RBC: 3.88 MIL/uL — ABNORMAL LOW (ref 4.22–5.81)
RDW: 19.2 % — ABNORMAL HIGH (ref 11.5–15.5)
WBC: 9.8 10*3/uL (ref 4.0–10.5)

## 2018-01-23 LAB — LIPASE, BLOOD: Lipase: 29 U/L (ref 11–51)

## 2018-01-23 LAB — COMPREHENSIVE METABOLIC PANEL
ALT: 12 U/L (ref 0–44)
AST: 21 U/L (ref 15–41)
Albumin: 3.9 g/dL (ref 3.5–5.0)
Alkaline Phosphatase: 159 U/L — ABNORMAL HIGH (ref 38–126)
Anion gap: 16 — ABNORMAL HIGH (ref 5–15)
BUN: 35 mg/dL — ABNORMAL HIGH (ref 6–20)
CO2: 23 mmol/L (ref 22–32)
Calcium: 9.1 mg/dL (ref 8.9–10.3)
Chloride: 91 mmol/L — ABNORMAL LOW (ref 98–111)
Creatinine, Ser: 9.15 mg/dL — ABNORMAL HIGH (ref 0.61–1.24)
GFR calc Af Amer: 7 mL/min — ABNORMAL LOW (ref >60)
GFR calc non Af Amer: 6 mL/min — ABNORMAL LOW (ref >60)
Glucose, Bld: 93 mg/dL (ref 70–99)
Potassium: 6 mmol/L — ABNORMAL HIGH (ref 3.5–5.1)
Sodium: 130 mmol/L — ABNORMAL LOW (ref 135–145)
Total Bilirubin: 1.1 mg/dL (ref 0.3–1.2)
Total Protein: 6.9 g/dL (ref 6.5–8.1)

## 2018-01-23 LAB — PATHOLOGIST SMEAR REVIEW: Path Review: REACTIVE

## 2018-01-23 MED ORDER — LIDOCAINE HCL (PF) 2 % IJ SOLN
INTRAMUSCULAR | Status: AC | PRN
  Administered 2018-01-23: 10 mL

## 2018-01-23 MED ORDER — SODIUM CHLORIDE 0.9 % IV SOLN: 100.0000 mL | INTRAVENOUS | Status: DC | PRN

## 2018-01-23 MED ORDER — CHLORHEXIDINE GLUCONATE CLOTH 2 % EX PADS
6.0000 | MEDICATED_PAD | Freq: Every day | CUTANEOUS | Status: DC
  Administered 2018-01-24 – 2018-01-25 (×2): 6 via TOPICAL

## 2018-01-23 MED ORDER — LIDOCAINE HCL (PF) 1 % IJ SOLN: 5.0000 mL | INTRAMUSCULAR | Status: DC | PRN

## 2018-01-23 MED ORDER — ALBUTEROL SULFATE (2.5 MG/3ML) 0.083% IN NEBU: 3.0000 mL | INHALATION_SOLUTION | Freq: Four times a day (QID) | RESPIRATORY_TRACT | Status: DC | PRN

## 2018-01-23 MED ORDER — SEVELAMER CARBONATE 800 MG PO TABS
800.0000 mg | ORAL_TABLET | Freq: Three times a day (TID) | ORAL | Status: DC
  Administered 2018-01-24 – 2018-01-25 (×5): 800 mg via ORAL
  Filled 2018-01-23 (×6): qty 1

## 2018-01-23 MED ORDER — SODIUM CHLORIDE 0.9 % IV BOLUS
500.0000 mL | Freq: Once | INTRAVENOUS | Status: AC
  Administered 2018-01-23: 500 mL via INTRAVENOUS

## 2018-01-23 MED ORDER — ACETAMINOPHEN 325 MG PO TABS
650.0000 mg | ORAL_TABLET | Freq: Four times a day (QID) | ORAL | Status: DC | PRN
  Filled 2018-01-23 (×2): qty 2

## 2018-01-23 MED ORDER — FENTANYL CITRATE (PF) 100 MCG/2ML IJ SOLN
50.0000 ug | Freq: Once | INTRAMUSCULAR | Status: AC
Start: 2018-01-23 — End: 2018-01-23
  Administered 2018-01-23: 50 ug via INTRAVENOUS
  Filled 2018-01-23: qty 2

## 2018-01-23 MED ORDER — OXYCODONE HCL 5 MG PO TABS
5.0000 mg | ORAL_TABLET | Freq: Four times a day (QID) | ORAL | Status: DC | PRN
  Administered 2018-01-23: 5 mg via ORAL
  Filled 2018-01-23: qty 1

## 2018-01-23 MED ORDER — ONDANSETRON HCL 4 MG PO TABS
4.0000 mg | ORAL_TABLET | Freq: Four times a day (QID) | ORAL | Status: DC | PRN
  Filled 2018-01-23: qty 1

## 2018-01-23 MED ORDER — DILTIAZEM HCL 25 MG/5ML IV SOLN
10.0000 mg | Freq: Once | INTRAVENOUS | Status: AC
  Administered 2018-01-23: 10 mg via INTRAVENOUS
  Filled 2018-01-23: qty 5

## 2018-01-23 MED ORDER — CLOPIDOGREL BISULFATE 75 MG PO TABS
75.0000 mg | ORAL_TABLET | Freq: Every day | ORAL | Status: DC
  Administered 2018-01-23: 75 mg via ORAL
  Filled 2018-01-23 (×2): qty 1

## 2018-01-23 MED ORDER — CIPROFLOXACIN HCL 500 MG PO TABS
500.0000 mg | ORAL_TABLET | Freq: Every day | ORAL | Status: DC
  Administered 2018-01-23 – 2018-01-24 (×2): 500 mg via ORAL
  Filled 2018-01-23 (×2): qty 1

## 2018-01-23 MED ORDER — OXYCODONE HCL 5 MG PO TABS
5.0000 mg | ORAL_TABLET | ORAL | Status: DC | PRN
  Administered 2018-01-23 – 2018-01-24 (×5): 10 mg via ORAL
  Administered 2018-01-24: 5 mg via ORAL
  Administered 2018-01-25 (×3): 10 mg via ORAL
  Filled 2018-01-23 (×3): qty 2
  Filled 2018-01-23: qty 1
  Filled 2018-01-23 (×5): qty 2

## 2018-01-23 MED ORDER — HYDRALAZINE HCL 100 MG PO TABS
100.0000 mg | ORAL_TABLET | Freq: Two times a day (BID) | ORAL | Status: DC
Start: 2018-01-23 — End: 2018-01-23

## 2018-01-23 MED ORDER — SODIUM CHLORIDE 0.9 % IV SOLN
1.0000 g | Freq: Once | INTRAVENOUS | Status: AC
  Administered 2018-01-23: 1 g via INTRAVENOUS
  Filled 2018-01-23: qty 10

## 2018-01-23 MED ORDER — GABAPENTIN 100 MG PO CAPS
100.0000 mg | ORAL_CAPSULE | Freq: Two times a day (BID) | ORAL | Status: DC
  Administered 2018-01-23 – 2018-01-25 (×4): 100 mg via ORAL
  Filled 2018-01-23 (×4): qty 1

## 2018-01-23 MED ORDER — FENTANYL CITRATE (PF) 100 MCG/2ML IJ SOLN
50.0000 ug | Freq: Once | INTRAMUSCULAR | Status: AC
  Administered 2018-01-23: 50 ug via INTRAVENOUS
  Filled 2018-01-23: qty 2

## 2018-01-23 MED ORDER — HYDROMORPHONE HCL 1 MG/ML IJ SOLN
1.0000 mg | Freq: Once | INTRAMUSCULAR | Status: AC
  Administered 2018-01-23: 1 mg via INTRAVENOUS
  Filled 2018-01-23: qty 1

## 2018-01-23 MED ORDER — LIDOCAINE HCL (PF) 2 % IJ SOLN
INTRAMUSCULAR | Status: AC
  Filled 2018-01-23: qty 20

## 2018-01-23 MED ORDER — PENTAFLUOROPROP-TETRAFLUOROETH EX AERO: 1.0000 "application " | INHALATION_SPRAY | CUTANEOUS | Status: DC | PRN

## 2018-01-23 MED ORDER — ONDANSETRON HCL 4 MG/2ML IJ SOLN
4.0000 mg | Freq: Four times a day (QID) | INTRAMUSCULAR | Status: DC | PRN
  Administered 2018-01-23: 4 mg via INTRAVENOUS
  Filled 2018-01-23: qty 2

## 2018-01-23 MED ORDER — DILTIAZEM HCL-DEXTROSE 100-5 MG/100ML-% IV SOLN (PREMIX)
5.0000 mg/h | INTRAVENOUS | Status: DC
  Administered 2018-01-23: 5 mg/h via INTRAVENOUS
  Filled 2018-01-23: qty 100

## 2018-01-23 MED ORDER — LACTULOSE ENCEPHALOPATHY 10 GM/15ML PO SOLN: 20.0000 g | Freq: Two times a day (BID) | ORAL | Status: DC

## 2018-01-23 MED ORDER — HEPARIN SODIUM (PORCINE) 1000 UNIT/ML DIALYSIS
1000.0000 [IU] | INTRAMUSCULAR | Status: DC | PRN
  Filled 2018-01-23: qty 1

## 2018-01-23 MED ORDER — DILTIAZEM HCL-DEXTROSE 100-5 MG/100ML-% IV SOLN (PREMIX)
5.0000 mg/h | Freq: Once | INTRAVENOUS | Status: AC
  Administered 2018-01-23: 5 mg/h via INTRAVENOUS
  Filled 2018-01-23: qty 100

## 2018-01-23 MED ORDER — BISACODYL 5 MG PO TBEC: 5.0000 mg | DELAYED_RELEASE_TABLET | Freq: Every day | ORAL | Status: DC | PRN

## 2018-01-23 MED ORDER — SODIUM BICARBONATE 8.4 % IV SOLN
50.0000 meq | Freq: Once | INTRAVENOUS | Status: AC
  Administered 2018-01-23: 50 meq via INTRAVENOUS
  Filled 2018-01-23: qty 50

## 2018-01-23 MED ORDER — LACTULOSE 10 GM/15ML PO SOLN
20.0000 g | Freq: Two times a day (BID) | ORAL | Status: DC
  Administered 2018-01-23 – 2018-01-24 (×2): 20 g via ORAL
  Filled 2018-01-23 (×5): qty 30

## 2018-01-23 MED ORDER — DILTIAZEM HCL ER COATED BEADS 120 MG PO CP24
120.0000 mg | ORAL_CAPSULE | Freq: Every day | ORAL | Status: DC
  Administered 2018-01-23: 120 mg via ORAL
  Filled 2018-01-23: qty 1

## 2018-01-23 MED ORDER — LIDOCAINE-PRILOCAINE 2.5-2.5 % EX CREA
1.0000 "application " | TOPICAL_CREAM | CUTANEOUS | Status: DC | PRN
  Filled 2018-01-23: qty 5

## 2018-01-23 MED ORDER — HYDRALAZINE HCL 50 MG PO TABS
100.0000 mg | ORAL_TABLET | Freq: Two times a day (BID) | ORAL | Status: DC
  Administered 2018-01-23 – 2018-01-25 (×4): 100 mg via ORAL
  Filled 2018-01-23 (×4): qty 2

## 2018-01-23 MED ORDER — TRAZODONE HCL 150 MG PO TABS: 150.0000 mg | ORAL_TABLET | Freq: Every evening | ORAL | Status: DC | PRN

## 2018-01-23 MED ORDER — ACETAMINOPHEN 650 MG RE SUPP: 650.0000 mg | Freq: Four times a day (QID) | RECTAL | Status: DC | PRN

## 2018-01-23 MED ORDER — ALTEPLASE 2 MG IJ SOLR: 2.0000 mg | Freq: Once | INTRAMUSCULAR | Status: DC | PRN

## 2018-01-23 NOTE — ED Notes (Signed)
Called Dr Darl Householder to report bp, hr. Ordering 500 ml bolus and remain at 5 mg Cardizem drip

## 2018-01-23 NOTE — Consult Note (Addendum)
History and physical    [name]  AUQ:[id number]  DOB: 07-21-1960  DOA: 01/23/2018  PCP: [name], MD   Outpatient Specialists:    Requesting physician: Darl Householder   Reason for consultation: admission    History of Present Illness: [name] is an 57 y.o. 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 Initial evaluation reveals ascites and creatinine of 9 and potassium 6. [facility] are asked to admit.  Information is obtained from the patient and the chart. He states he was discharged yesterday after a 2 day hospitalization for ascites and atrial fib/flutter with RVR. He had 2.1 L of clear yellow fluid via paracentesis on July 13. He is unclear of the size of his abdomen at discharge yesterday. He states in the few hours he's been gone he developed abdominal pain. Unclear abdomen is any bigger. Describes the pain as constant scribes as a pressure located throughout his abdomen. He denies any nausea vomiting fevers headache chills. He reports his abdominal discomfort so intense today he could not tolerate dialysis. He did attend dialysis 2 days ago is a Monday Wednesday Friday dialysis patient at [facility] denies chest pain palpitation shortness of breath or lower extremity edema. He denies diarrhea constipation melena bright red blood per rectum.   Review of Systems:  10 point review of systems complete and all systems are negative except as indicated in the history of present illness  Past Medical History: Past Medical History:  Diagnosis Date  . Anemia   . Anxiety   . Arthritis    left shoulder  . Atherosclerosis of aorta  ([place])   . Cardiomegaly   . Chest pain    DATE UNKNOWN, C/O PERIODICALLY  . Cocaine abuse ([place])   . COPD exacerbation ([place]) 08/17/2016  . Coronary artery disease    stent 02/22/17  . ESRD (end stage renal disease) on dialysis ([place])    "[name]; MWF" (07/04/2017)  . GERD (gastroesophageal reflux disease)    DATE UNKNOWN  . Hemorrhoids   . Hepatitis B, chronic ([place])   . Hepatitis C   . History of kidney stones   . Hyperkalemia   . Hypertension   . Metabolic bone disease    Patient denies  . Mitral stenosis   . Myocardial infarction ([place])   . Pneumonia   . Pulmonary edema   . Solitary rectal ulcer syndrome 07/2017   at flex sig for rectal bleeding  . Tubular adenoma of colon     Past Surgical History: Past Surgical History:  Procedure Laterality Date  . A/V FISTULAGRAM Left 05/26/2017   Procedure: A/V FISTULAGRAM;  Surgeon: Conrad Leshara, MD;  Location: [place] [facility];  Service: Cardiovascular;  Laterality: Left;  . A/V FISTULAGRAM Right 11/18/2017   Procedure: A/V FISTULAGRAM - Right Arm;  Surgeon: Elam Dutch, MD;  Location: [place] [facility];  Service: Cardiovascular;  Laterality: Right;  . APPLICATION OF WOUND VAC Left 06/14/2017   Procedure: APPLICATION OF WOUND VAC;  Surgeon: Katha Cabal, MD;  Location: [facility];  Service: Vascular;  Laterality: Left;  . AV FISTULA PLACEMENT  2012   BELIEVED  WAS PLACED IN JUNE  . AV FISTULA PLACEMENT Right 08/09/2017   Procedure: Creation Right arm ARTERIOVENOUS BRACHIOCEPOHALIC FISTULA;  Surgeon: Elam Dutch, MD;  Location: [facility];  Service: Vascular;  Laterality: Right;  . AV FISTULA PLACEMENT Right 11/22/2017   Procedure: INSERTION OF ARTERIOVENOUS (AV) GORE-TEX GRAFT RIGHT UPPER ARM;  Surgeon: Elam Dutch, MD;  Location: [place];  Service: Vascular;  Laterality: Right;  . COLONOSCOPY    . CORONARY STENT INTERVENTION N/A 02/22/2017   Procedure: CORONARY STENT INTERVENTION;  Surgeon: Nigel Mormon, MD;   Location: [place] [facility];  Service: Cardiovascular;  Laterality: N/A;  . FLEXIBLE SIGMOIDOSCOPY N/A 07/15/2017   Procedure: FLEXIBLE SIGMOIDOSCOPY;  Surgeon: Carol Ada, MD;  Location: [place];  Service: Endoscopy;  Laterality: N/A;  . HEMORRHOID BANDING    . I&D EXTREMITY Left 06/01/2017   Procedure: IRRIGATION AND DEBRIDEMENT LEFT ARM HEMATOMA WITH LIGATION OF LEFT ARM AV FISTULA;  Surgeon: Elam Dutch, MD;  Location: [place];  Service: Vascular;  Laterality: Left;  . I&D EXTREMITY Left 06/14/2017   Procedure: IRRIGATION AND DEBRIDEMENT EXTREMITY;  Surgeon: Katha Cabal, MD;  Location: [facility];  Service: Vascular;  Laterality: Left;  . INSERTION OF DIALYSIS CATHETER  05/30/2017  . INSERTION OF DIALYSIS CATHETER N/A 05/30/2017   Procedure: INSERTION OF DIALYSIS CATHETER;  Surgeon: Elam Dutch, MD;  Location: [place];  Service: Vascular;  Laterality: N/A;  . IR PARACENTESIS  08/30/2017  . IR PARACENTESIS  09/29/2017  . IR PARACENTESIS  10/28/2017  . IR PARACENTESIS  11/09/2017  . IR PARACENTESIS  11/16/2017  . IR PARACENTESIS  11/28/2017  . IR PARACENTESIS  12/01/2017  . IR PARACENTESIS  12/06/2017  . IR PARACENTESIS  01/03/2018  . LEFT HEART CATH AND CORONARY ANGIOGRAPHY N/A 02/22/2017   Procedure: LEFT HEART CATH AND CORONARY ANGIOGRAPHY;  Surgeon: Nigel Mormon, MD;  Location: [place] [facility];  Service: Cardiovascular;  Laterality: N/A;  . LIGATION OF ARTERIOVENOUS  FISTULA Left 07/21/5091   Procedure: Plication of Left Arm Arteriovenous Fistula;  Surgeon: Elam Dutch, MD;  Location: [place];  Service: Vascular;  Laterality: Left;  . POLYPECTOMY    . REVISON OF ARTERIOVENOUS FISTULA Left 2/67/1245   Procedure: PLICATION OF DISTAL ANEURYSMAL SEGEMENT OF LEFT UPPER ARM ARTERIOVENOUS FISTULA;  Surgeon: Elam Dutch, MD;  Location: [place];  Service: Vascular;  Laterality: Left;  . REVISON OF ARTERIOVENOUS FISTULA Left 02/18/9832   Procedure: Plication of Left Upper  Arm Fistula ;  Surgeon: Waynetta Sandy, MD;  Location: [place];  Service: Vascular;  Laterality: Left;  . SKIN GRAFT SPLIT THICKNESS LEG / FOOT Left    SKIN GRAFT SPLIT THICKNESS LEFT ARM DONOR SITE: LEFT ANTERIOR THIGH  . SKIN SPLIT GRAFT Left 07/04/2017   Procedure: SKIN GRAFT SPLIT THICKNESS LEFT ARM DONOR SITE: LEFT ANTERIOR THIGH;  Surgeon: Elam Dutch, MD;  Location: [place];  Service: Vascular;  Laterality: Left;  . THROMBECTOMY W/ EMBOLECTOMY Left 06/05/2017   Procedure: EXPLORATION OF LEFT ARM FOR BLEEDING; OVERSEWED PROXIMAL FISTULA;  Surgeon: Angelia Mould, MD;  Location: [place];  Service: Vascular;  Laterality: Left;  . WOUND EXPLORATION Left 06/03/2017   Procedure: WOUND EXPLORATION WITH WOUND VAC APPLICATION TO LEFT ARM;  Surgeon: Angelia Mould, MD;  Location: [place];  Service: Vascular;  Laterality: Left;     Allergies:   Allergies  Allergen Reactions  . Morphine And Related Other (See Comments)  Stomach pain  . Aspirin Other (See Comments)    STOMACH PAIN  . Clonidine Derivatives Itching  . Tramadol Itching  . Tylenol [Acetaminophen] Nausea Only    Stomach ache     Social History:  reports that he has been smoking cigarettes.  He started smoking about 44 years ago. He has a 21.50 pack-year smoking history. He has never used smokeless tobacco. He reports that he drank alcohol. He reports that he has current or past drug history. Drug: Marijuana.   Family History: Family History  Problem Relation Age of Onset  . Heart disease Mother   . Lung cancer Mother   . Heart disease Father   . Malignant hyperthermia Father   . COPD Father   . Throat cancer Sister   . Esophageal cancer Sister   . Hypertension Other   . COPD Other   . Colon cancer Neg Hx   . Colon polyps Neg Hx   . Rectal cancer Neg Hx   . Stomach cancer Neg Hx      Physical Exam: Vitals:   01/23/18 0535 01/23/18 0615 01/23/18 0630 01/23/18 0700  BP: 140/78 (!)  170/85 (!) 168/81 (!) 181/93  Pulse: (!) 107 (!) 103 99 (!) 115  Resp: 18     Temp: 98.7 F (37.1 C)     TempSrc: Oral     SpO2: 98% 97% 97% 97%    Constitutional:   Alert and awake, oriented x3, not in any acute distress. Eyes: PERLA, EOMI, irises appear normal, anicteric sclera,  ENMT: external ears and nose appear normal,            Lips appears normal, oropharynx mucosa, tongue, posterior pharynx appear normal  Neck: neck appears normal, no masses, normal ROM, no thyromegaly, no JVD  CVS: S1-S2 clear, no murmur rubs or gallops, no LE edema, normal pedal pulses  Respiratory:  clear to auscultation bilaterally, no wheezing, rales or rhonchi. Respiratory effort normal. No accessory muscle use.  Abdomen: quite distended and tight with no wave very sluggish bowel sounds mild diffuse tenderness to palpation no guarding or rebounding Musculoskeletal: : no cyanosis, clubbing or edema noted bilaterally                       Neuro: Cranial nerves II-XII intact, strength, sensation, reflexes Psych: judgement and insight appear normal, stable mood and affect, mental status Skin: no rashes or lesions or ulcers, no induration or nodules    Data reviewed:  I have personally reviewed following labs and imaging studies Labs:  CBC: Recent Labs  Lab 01/20/18 2214 01/21/18 0500 01/23/18 0627  WBC 8.7 6.6 9.8  HGB 10.2* 9.8* 10.5*  HCT 30.4* 29.8* 31.1*  MCV 80.6 80.8 80.2  PLT 98* 68* PENDING    Basic Metabolic Panel: Recent Labs  Lab 01/20/18 2214 01/21/18 0500 01/23/18 0627  NA 134* 134* 130*  K 3.7 4.3 6.0*  CL 93* 91* 91*  CO2 28 29 23   GLUCOSE 134* 92 93  BUN 15 17 35*  CREATININE 5.09* 5.30* 9.15*  CALCIUM 8.6* 8.8* 9.1   GFR Estimated Creatinine Clearance: 8.8 mL/min (A) (by C-G formula based on SCr of 9.15 mg/dL (H)). Liver Function Tests: Recent Labs  Lab 01/20/18 2214 01/23/18 0627  AST 24 21  ALT 11 12  ALKPHOS 189* 159*  BILITOT 0.8 1.1  PROT 7.1 6.9    ALBUMIN 3.8 3.9   Recent Labs  Lab 01/20/18 2214 01/23/18 3875  LIPASE 43 29   No results for input(s): AMMONIA in the last 168 hours. Coagulation profile Recent Labs  Lab 01/21/18 0249  INR 1.26    Cardiac Enzymes: No results for input(s): CKTOTAL, CKMB, CKMBINDEX, TROPONINI in the last 168 hours. BNP: Invalid input(s): POCBNP CBG: No results for input(s): GLUCAP in the last 168 hours. D-Dimer No results for input(s): DDIMER in the last 72 hours. Hgb A1c No results for input(s): HGBA1C in the last 72 hours. Lipid Profile No results for input(s): CHOL, HDL, LDLCALC, TRIG, CHOLHDL, LDLDIRECT in the last 72 hours. Thyroid function studies Recent Labs    01/21/18 0500  TSH 1.654   Anemia work up No results for input(s): VITAMINB12, FOLATE, FERRITIN, TIBC, IRON, RETICCTPCT in the last 72 hours. Urinalysis    Component Value Date/Time   COLORURINE YELLOW 07/16/2011 1619   APPEARANCEUR CLEAR 07/16/2011 1619   LABSPEC 1.018 07/16/2011 1619   PHURINE 7.5 07/16/2011 1619   GLUCOSEU 250 (A) 07/16/2011 1619   HGBUR MODERATE (A) 07/16/2011 1619   BILIRUBINUR SMALL (A) 07/16/2011 1619   KETONESUR NEGATIVE 07/16/2011 1619   PROTEINUR >300 (A) 07/16/2011 1619   UROBILINOGEN 1.0 07/16/2011 1619   NITRITE NEGATIVE 07/16/2011 1619   LEUKOCYTESUR SMALL (A) 07/16/2011 1619     Microbiology Recent Results (from the past 240 hour(s))  MRSA PCR Screening     Status: None   Collection Time: 01/21/18  7:40 AM  Result Value Ref Range Status   MRSA by PCR NEGATIVE NEGATIVE Final    Comment:        The GeneXpert MRSA Assay (FDA approved for NASAL specimens only), is one component of a comprehensive MRSA colonization surveillance program. It is not intended to diagnose MRSA infection nor to guide or monitor treatment for MRSA infections. Performed at [facility], [place] [address], [place], [place] [address]   Gram stain     Status: None   Collection Time: 01/21/18  12:05 PM  Result Value Ref Range Status   Specimen Description PERITONEAL  Final   Special Requests NONE  Final   Gram Stain   Final    RARE WBC PRESENT, PREDOMINANTLY MONONUCLEAR NO ORGANISMS SEEN Performed at [facility], [address], [place], [place] [address]    Report Status 01/21/2018 FINAL  Final       Inpatient Medications:   Scheduled Meds: . fentaNYL (SUBLIMAZE) injection  50 mcg Intravenous Once   Continuous Infusions: . calcium gluconate 1 GM IVPB 1 g (01/23/18 0834)     Radiological Exams on Admission: US Paracentesis  Result Date: 01/21/2018 INDICATION: History of alcoholic cirrhosis. Recurrent ascites. Request for diagnostic and therapeutic paracentesis. EXAM: ULTRASOUND GUIDED LEFT LOWER QUADRANT PARACENTESIS MEDICATIONS: None. COMPLICATIONS: None immediate. PROCEDURE: Informed written consent was obtained from the patient after a discussion of the risks, benefits and alternatives to treatment. A timeout was performed prior to the initiation of the procedure. Initial ultrasound scanning demonstrates a large amount of ascites within the left lower abdominal quadrant. The left lower abdomen was prepped and draped in the usual sterile fashion. 1% lidocaine with epinephrine was used for local anesthesia. Following this, a 19 gauge, 7-cm, Yueh catheter was introduced. An ultrasound image was saved for documentation purposes. The paracentesis was performed. The catheter was removed and a dressing was applied. The patient tolerated the procedure well without immediate post procedural complication. FINDINGS: A total of approximately 2.1 L of clear yellow fluid was removed. Samples were sent to  the laboratory as requested by the clinical team. IMPRESSION: Successful ultrasound-guided paracentesis yielding 2.1 liters of peritoneal fluid. Read by: Ascencion Dike PA-C Electronically Signed   By: Jacqulynn Cadet M.D.   On: 01/21/2018 12:27   Dg Abd Acute W/chest  Result  Date: 01/23/2018 CLINICAL DATA:  Abdominal pain and distention. EXAM: DG ABDOMEN ACUTE W/ 1V CHEST COMPARISON:  CT scan of the abdomen dated 01/02/2018 and chest x-ray dated 12/30/2017 FINDINGS: There are no dilated loops of bowel. Extensive ascites in the abdomen. Extensive arterial vascular calcifications throughout the abdomen. Chronic cardiomegaly. Double lumen central venous catheter in place with the tips in the right atrium, unchanged. Minimal blunting of the left costophrenic angle laterally. Small metallic device in the rectum, unchanged since the prior CT scan. IMPRESSION: Prominent ascites.  No significant change since the prior CT scan. Electronically Signed   By: Lorriane Shire M.D.   On: 01/23/2018 07:40    Impression/Recommendations Principal Problem:   Abdominal pain Active Problems:   Ascites   ESRD (end stage renal disease) (H[facility])   Chronic hepatitis B ([facility])   Chronic hepatitis C without hepatic coma ([facility])   Thrombocytopenia ([facility])   Essential hypertension   Anemia   COPD (chronic obstructive pulmonary disease) ([facility])  #1.abdominal pain related to alcoholic cirrhosis with ascites. History of same. Had paracentesis 2 days ago. No significant WBCs or organisms seen on peritoneal fluid analysis cell count and Gram stain at that time. Was discharged yesterday and returns with abdominal pain. Patient's been counseled in past he needs serial paracentesis somehow this has not been arranged. He continues to be afebrile no leukocytosis no mental status changes. Home medications include chronic Cipro. -recommend discharge after paracentesis barring any complications. -Have spoken with interventional radiology for immediate paracentesis, no labs today -Requested prescription for serial paracentesis to be faxed to interventional radiology per the ED provider -Have spoken with scheduling and once they have received order patient is to call [phone] to schedule appointment. Of note,  appointments can be scheduled 4 weeks at a time.  -appreciate assistance from case management  #2. End-stage renal disease on dialysis. Potassium 6.o. ekg as noted above. Patient is a Monday Wednesday Friday dialysis patient. He has not missed any dialysis appointments except for today. Have spoken with nephrology who arranged for patient to have dialysis at his normal outpatient facility later this afternoon. -discharge to home -regular dialysis center expecting him this afternoon -appreciate assistance of nephrology  3.hypertension. Controlled in the emergency department.     Thank you for this consultation.  Our [facility] hospitalist team will follow the patient with you.   Time Spent:  110 minutes  Radene Gunning M.D. [facility] 01/23/2018, 8:51 AM  Addendum: patient developed afib with RVR after paracentesis while waiting to be discharged. He had not received home meds. Hx of same.   He was then admitted to hospitalist service with diltz gtt. At time of this addendum HR 105.   In addition elevated troponin. Likely related to demand. No chest pain. ecg as noted above. Will monitor  I also notified nephrology that patient was to be admitted and dr Joelyn Oms agreed to see.  Santiago Glad.black NP

## 2018-01-23 NOTE — ED Notes (Signed)
Paged [name]

## 2018-01-23 NOTE — Progress Notes (Signed)
Pt presented to ED with recurrent abdominal pain. Discussed with admitting providers.  Plan is for LVP this AM for symptom control.  I have coordinated for him to go to outpatient HD this afternoon following paracentesis. If delay occurs we can see and provide inpatient treatment. K 6.0.

## 2018-01-23 NOTE — ED Notes (Signed)
Admitting provider at bedside instructed me to stop bolus. Orders to follow.

## 2018-01-23 NOTE — Procedures (Signed)
PROCEDURE SUMMARY:  Successful image-guided paracentesis from the left abdomen.  Yielded 1.6 liters of clear yellow fluid.  No immediate complications.  Patient tolerated well.   Specimen was not sent for labs.  Joaquim Nam PA-C 01/23/2018 10:11 AM

## 2018-01-23 NOTE — ED Notes (Signed)
Informed pt about needing a urine sample, pt says he is unable to make urine.

## 2018-01-23 NOTE — ED Provider Notes (Signed)
[facility] EMERGENCY DEPARTMENT Provider Note   CSN: [id number] Arrival date & time: 01/23/18  8768     History   Chief Complaint Chief Complaint  Patient presents with  . Abdominal Pain    HPI Rykin Route is a 57 y.o. male hx of ESRD on HD (last HD 2 days ago), here presenting with abdominal pain.  Patient was recently admitted for abdominal pain secondary to cirrhosis.  Patient had 2 L removed from his abdomen and there was no SBP on the paracentesis fluid.  Patient was discharged home yesterday and supposed to go to dialysis today.  However, patient states that his abdomen became more distended since discharge from the hospital yesterday and has diffuse abdominal pain.  Denies any nausea vomiting or fevers.  Patient states that he was in too much pain to go to dialysis today so came in the ED for evaluation.  Patient states that he goes to dialysis center at [facility] and has no way to get there.   The history is provided by the patient.    Past Medical History:  Diagnosis Date  . Anemia   . Anxiety   . Arthritis    left shoulder  . Atherosclerosis of aorta ([place])   . Cardiomegaly   . Chest pain    DATE UNKNOWN, C/O PERIODICALLY  . Cocaine abuse ([place])   . COPD exacerbation ([place]) 08/17/2016  . Coronary artery disease    stent 02/22/17  . ESRD (end stage renal disease) on dialysis ([place])    "[name]; MWF" (07/04/2017)  . GERD (gastroesophageal reflux disease)    DATE UNKNOWN  . Hemorrhoids   . Hepatitis B, chronic ([place])   . Hepatitis C   . History of kidney stones   . Hyperkalemia   . Hypertension   . Metabolic bone disease    Patient denies  . Mitral stenosis   . Myocardial infarction ([place])   . Pneumonia   . Pulmonary edema   . Solitary rectal ulcer syndrome 07/2017   at flex sig for rectal bleeding  . Tubular adenoma of colon     Patient Active Problem List   Diagnosis Date Noted  . Hx of colonic polyps 01/20/2018  .  ESRD (end stage renal disease) ([place]) 11/21/2017  . GERD (gastroesophageal reflux disease) 11/16/2017  . Liver cirrhosis ([place]) 11/15/2017  . DNR (do not resuscitate)   . Palliative care by specialist   . Hyponatremia 11/04/2017  . SBP (spontaneous bacterial peritonitis) ([place]) 10/30/2017  . Liver disease, chronic 10/30/2017  . SOB (shortness of breath)   . Abdominal pain 10/28/2017  . Upper airway cough syndrome with flattening on f/v loop 10/13/17 c/w vcd 10/17/2017  . Elevated diaphragm 10/13/2017  . Ileus ([place]) 09/29/2017  . Malnutrition of moderate degree 09/29/2017  . Sinus congestion 09/03/2017  . Symptomatic anemia 09/02/2017  . Other cirrhosis of liver ([place]) 09/02/2017  . Left bundle branch block 09/02/2017  . Mitral stenosis 09/02/2017  . Hematochezia 07/15/2017  . Wide-complex tachycardia ([place])   . Endotracheally intubated   . ESRD on dialysis ([place]) 07/04/2017  . Acute respiratory failure with hypoxia ([place]) 06/18/2017  . CKD (chronic kidney disease) stage V requiring chronic dialysis ([place]) 06/18/2017  . History of Cocaine abuse ([place]) 06/18/2017  . Hypertension 06/18/2017  . Infection of AV graft for dialysis ([place]) 06/18/2017  . Anxiety 06/18/2017  . Anemia due to chronic kidney disease 06/18/2017  . Atrial flutter with rapid ventricular response (  [place]) 06/18/2017  . Personality disorder ([place]) 06/13/2017  . Cellulitis 06/12/2017  . Adjustment disorder with mixed anxiety and depressed mood 06/10/2017  . Suicidal ideation 06/10/2017  . Arm wound, left, sequela 06/10/2017  . Dyspnea on exertion 05/29/2017  . Tachycardia 05/29/2017  . Hyperkalemia 05/22/2017  . Acute metabolic encephalopathy   . Anemia 04/23/2017  . Ascites 04/23/2017  . COPD (chronic obstructive pulmonary disease) ([place]) 04/23/2017  . Acute on chronic respiratory failure with hypoxia ([place]) 03/25/2017  . Arrhythmia 03/25/2017  . COPD GOLD 0 with flattening on inps f/v  09/27/2016  . Essential hypertension  09/27/2016  . Fluid overload 08/30/2016  . COPD exacerbation ([place]) 08/17/2016  . Hypertensive urgency 08/17/2016  . Respiratory failure ([place]) 08/17/2016  . Problem with dialysis access ([place]) 07/23/2016  . Chronic hepatitis B ([place]) 03/05/2014  . Chronic hepatitis C without hepatic coma ([place]) 03/05/2014  . Internal hemorrhoids with bleeding, swelling and itching 03/05/2014  . Thrombocytopenia ([place]) 03/05/2014  . Chest pain 02/27/2014  . Alcohol abuse 04/14/2009  . Cigarette smoker 04/14/2009  . GANGLION CYST 04/14/2009    Past Surgical History:  Procedure Laterality Date  . A/V FISTULAGRAM Left 05/26/2017   Procedure: A/V FISTULAGRAM;  Surgeon: Conrad Holland, MD;  Location: [facility];  Service: Cardiovascular;  Laterality: Left;  . A/V FISTULAGRAM Right 11/18/2017   Procedure: A/V FISTULAGRAM - Right Arm;  Surgeon: Elam Dutch, MD;  Location: [facility];  Service: Cardiovascular;  Laterality: Right;  . APPLICATION OF WOUND VAC Left 06/14/2017   Procedure: APPLICATION OF WOUND VAC;  Surgeon: Katha Cabal, MD;  Location: [facility];  Service: Vascular;  Laterality: Left;  . AV FISTULA PLACEMENT  2012   BELIEVED WAS PLACED IN JUNE  . AV FISTULA PLACEMENT Right 08/09/2017   Procedure: Creation Right arm ARTERIOVENOUS BRACHIOCEPOHALIC FISTULA;  Surgeon: Elam Dutch, MD;  [facility];  Service: Vascular;  Laterality: Right;  . AV FISTULA PLACEMENT Right 11/22/2017   Procedure: INSERTION OF ARTERIOVENOUS (AV) GORE-TEX GRAFT RIGHT UPPER ARM;  Surgeon: Elam Dutch, MD;  Location: [facility];  Service: Vascular;  Laterality: Right;  . COLONOSCOPY    . CORONARY STENT INTERVENTION N/A 02/22/2017   Procedure: CORONARY STENT INTERVENTION;  Surgeon: Nigel Mormon, MD;  Location: [facility];  Service: Cardiovascular;  Laterality: N/A;  . FLEXIBLE SIGMOIDOSCOPY N/A 07/15/2017   Procedure: FLEXIBLE SIGMOIDOSCOPY;  Surgeon: Carol Ada, MD;  Location: [facility];  Service: Endoscopy;  Laterality: N/A;  . HEMORRHOID BANDING    . I&D EXTREMITY Left 06/01/2017   Procedure: IRRIGATION AND DEBRIDEMENT LEFT ARM HEMATOMA WITH LIGATION OF LEFT ARM AV FISTULA;  Surgeon: Elam Dutch, MD;  Location: [facility];  Service: Vascular;  Laterality: Left;  . I&D EXTREMITY Left 06/14/2017   Procedure: IRRIGATION AND DEBRIDEMENT EXTREMITY;  Surgeon: Katha Cabal, MD;  Location: [facility];  Service: Vascular;  Laterality: Left;  . INSERTION OF DIALYSIS CATHETER  05/30/2017  . INSERTION OF DIALYSIS CATHETER N/A 05/30/2017   Procedure: INSERTION OF DIALYSIS CATHETER;  Surgeon: Elam Dutch, MD;  Location: [facility];  Service: Vascular;  Laterality: N/A;  . IR PARACENTESIS  08/30/2017  . IR PARACENTESIS  09/29/2017  . IR PARACENTESIS  10/28/2017  . IR PARACENTESIS  11/09/2017  . IR PARACENTESIS  11/16/2017  . IR PARACENTESIS  11/28/2017  . IR PARACENTESIS  12/01/2017  . IR PARACENTESIS  12/06/2017  . IR PARACENTESIS  01/03/2018  .  LEFT HEART CATH AND CORONARY ANGIOGRAPHY N/A 02/22/2017   Procedure: LEFT HEART CATH AND CORONARY ANGIOGRAPHY;  Surgeon: Nigel Mormon, MD;  Location: [facility];  Service: Cardiovascular;  Laterality: N/A;  . LIGATION OF ARTERIOVENOUS  FISTULA Left 0/08/6376   Procedure: Plication of Left Arm Arteriovenous Fistula;  Surgeon: Elam Dutch, MD;  Location: [facility];  Service: Vascular;  Laterality: Left;  . POLYPECTOMY    . REVISON OF ARTERIOVENOUS FISTULA Left 5/88/5027   Procedure: PLICATION OF DISTAL ANEURYSMAL SEGEMENT OF LEFT UPPER ARM ARTERIOVENOUS FISTULA;  Surgeon: Elam Dutch, MD;  Location: [facility];  Service: Vascular;  Laterality: Left;  . REVISON OF ARTERIOVENOUS FISTULA Left 7/41/2878   Procedure: Plication of Left Upper Arm Fistula ;  Surgeon: Waynetta Sandy, MD;  Location: [facility];  Service: Vascular;  Laterality: Left;  . SKIN GRAFT SPLIT THICKNESS LEG / FOOT Left    SKIN GRAFT SPLIT THICKNESS LEFT  ARM DONOR SITE: LEFT ANTERIOR THIGH  . SKIN SPLIT GRAFT Left 07/04/2017   Procedure: SKIN GRAFT SPLIT THICKNESS LEFT ARM DONOR SITE: LEFT ANTERIOR THIGH;  Surgeon: Elam Dutch, MD;  Location: [facility];  Service: Vascular;  Laterality: Left;  . THROMBECTOMY W/ EMBOLECTOMY Left 06/05/2017   Procedure: EXPLORATION OF LEFT ARM FOR BLEEDING; OVERSEWED PROXIMAL FISTULA;  Surgeon: Angelia Mould, MD;  Location: [facility];  Service: Vascular;  Laterality: Left;  . WOUND EXPLORATION Left 06/03/2017   Procedure: WOUND EXPLORATION WITH WOUND VAC APPLICATION TO LEFT ARM;  Surgeon: Angelia Mould, MD;  Location: [facility];  Service: Vascular;  Laterality: Left;        Home Medications    Prior to Admission medications   Medication Sig Start Date End Date Taking? Authorizing Provider  albuterol (VENTOLIN HFA) 108 (90 Base) MCG/ACT inhaler Inhale 2 puffs into the lungs every 6 (six) hours as needed for wheezing or shortness of breath.   Yes [provider]  ciprofloxacin (CIPRO) 500 MG tablet Take 500 mg by mouth at bedtime.   Yes [provider]  clopidogrel (PLAVIX) 75 MG tablet Take 75 mg by mouth daily.   Yes [provider]  diltiazem (CARDIZEM CD) 120 MG 24 hr capsule Take 1 capsule (120 mg total) by mouth daily. 06/23/17 06/23/18 Yes Shahmehdi, Seyed A, MD  gabapentin (NEURONTIN) 100 MG capsule Take 100 mg by mouth 2 (two) times daily. 12/13/17  Yes [provider]  hydrALAZINE (APRESOLINE) 100 MG tablet Take 100 mg by mouth 2 (two) times daily. Taking after HD on Dialysis days   Yes [provider]  lactulose, encephalopathy, (CHRONULAC) 10 GM/15ML SOLN Take 15 ml's by mouth twice daily. 01/17/18  Yes Esterwood, Amy S, PA-C  oxyCODONE (OXY IR/ROXICODONE) 5 MG immediate release tablet Take 5 mg by mouth 2 (two) times daily as needed for pain. 12/13/17  Yes [provider]  sevelamer carbonate (RENVELA) 800 MG tablet Take 4 tablets with meals   And 2 tablets twice daily with snacks 01/22/18  Yes Patrecia Pour, MD  traZODone (DESYREL) 150 MG tablet Take 150 mg by mouth at bedtime as needed for sleep.   Yes [provider]    Family History Family History  Problem Relation Age of Onset  . Heart disease Mother   . Lung cancer Mother   . Heart disease Father   . Malignant hyperthermia Father   . COPD Father   . Throat cancer Sister   . Esophageal cancer Sister   .  Hypertension Other   . COPD Other   . Colon cancer Neg Hx   . Colon polyps Neg Hx   . Rectal cancer Neg Hx   . Stomach cancer Neg Hx     Social History Social History   Tobacco Use  . Smoking status: Current Every Day Smoker    Packs/day: 0.50    Years: 43.00    Pack years: 21.50    Types: Cigarettes    Start date: 08/13/1973  . Smokeless tobacco: Never Used  Substance Use Topics  . Alcohol use: Not Currently    Frequency: Never    Comment: quit drinking in 2017  . Drug use: Yes    Types: Marijuana    Comment: quit in 2017"     Allergies   Morphine and related; Aspirin; Clonidine derivatives; Tramadol; and Tylenol [acetaminophen]   Review of Systems Review of Systems  Gastrointestinal: Positive for abdominal pain.  All other systems reviewed and are negative.    Physical Exam Updated Vital Signs BP (!) 105/56   Pulse 98   Temp 98.7 F (37.1 C) (Oral)   Resp 19   SpO2 100%   Physical Exam  Constitutional: He is oriented to person, place, and time.  Uncomfortable   HENT:  Head: Normocephalic.  Mouth/Throat: Oropharynx is clear and moist.  Eyes: Pupils are equal, round, and reactive to light. EOM are normal.  Cardiovascular: Normal rate, regular rhythm and normal heart sounds.  Pulmonary/Chest: Effort normal and breath sounds normal.  Abdominal:  Mild distention, some fluid wave, mild diffuse tenderness   Neurological: He is alert and oriented to person, place, and time.  Skin: Skin is warm. Capillary refill takes less than  2 seconds.  Psychiatric: He has a normal mood and affect. His behavior is normal.  Nursing note and vitals reviewed.    ED Treatments / Results  Labs (all labs ordered are listed, but only abnormal results are displayed) Labs Reviewed  COMPREHENSIVE METABOLIC PANEL - Abnormal; Notable for the following components:      Result Value   Sodium 130 (*)    Potassium 6.0 (*)    Chloride 91 (*)    BUN 35 (*)    Creatinine, Ser 9.15 (*)    Alkaline Phosphatase 159 (*)    GFR calc non Af Amer 6 (*)    GFR calc Af Amer 7 (*)    Anion gap 16 (*)    All other components within normal limits  CBC - Abnormal; Notable for the following components:   RBC 3.88 (*)    Hemoglobin 10.5 (*)    HCT 31.1 (*)    RDW 19.2 (*)    Platelets 89 (*)    All other components within normal limits  LIPASE, BLOOD  URINALYSIS, ROUTINE W REFLEX MICROSCOPIC  RAPID URINE DRUG SCREEN, HOSP PERFORMED  I-STAT TROPONIN, ED    EKG EKG Interpretation  Date/Time:  Monday January 23 2018 07:46:50 EDT Ventricular Rate:  98 PR Interval:    QRS Duration: 173 QT Interval:  419 QTC Calculation: 535 R Axis:   -5 Text Interpretation:  Sinus tachycardia Paired ventricular premature complexes Left bundle branch block Peaked T waves new since previous  Confirmed by Wandra Arthurs [phone]) on 01/23/2018 7:50:27 AM   EKG Interpretation  Date/Time:  Monday January 23 2018 12:08:00 EDT Ventricular Rate:  159 PR Interval:    QRS Duration: 173 QT Interval:  363 QTC Calculation: 591 R Axis:   -73 Text  Interpretation:  Sinus tachycardia Left bundle branch block raoud afib new since previous Confirmed by Wandra Arthurs ([id number]) on 01/23/2018 12:27:45 PM         Radiology Dg Abd Acute W/chest  Result Date: 01/23/2018 CLINICAL DATA:  Abdominal pain and distention. EXAM: DG ABDOMEN ACUTE W/ 1V CHEST COMPARISON:  CT scan of the abdomen dated 01/02/2018 and chest x-ray dated 12/30/2017 FINDINGS: There are no dilated loops of bowel.  Extensive ascites in the abdomen. Extensive arterial vascular calcifications throughout the abdomen. Chronic cardiomegaly. Double lumen central venous catheter in place with the tips in the right atrium, unchanged. Minimal blunting of the left costophrenic angle laterally. Small metallic device in the rectum, unchanged since the prior CT scan. IMPRESSION: Prominent ascites.  No significant change since the prior CT scan. Electronically Signed   By: Lorriane Shire M.D.   On: 01/23/2018 07:40    Procedures Procedures (including critical care time)  CRITICAL CARE Performed by: Wandra Arthurs   Total critical care time: 30 minutes  Critical care time was exclusive of separately billable procedures and treating other patients.  Critical care was necessary to treat or prevent imminent or life-threatening deterioration.  Critical care was time spent personally by me on the following activities: development of treatment plan with patient and/or surrogate as well as nursing, discussions with consultants, evaluation of patient's response to treatment, examination of patient, obtaining history from patient or surrogate, ordering and performing treatments and interventions, ordering and review of laboratory studies, ordering and review of radiographic studies, pulse oximetry and re-evaluation of patient's condition.   Medications Ordered in ED Medications  fentaNYL (SUBLIMAZE) injection 50 mcg (50 mcg Intravenous Given 01/23/18 0741)  calcium gluconate 1 g in sodium chloride 0.9 % 100 mL IVPB (0 g Intravenous Stopped 01/23/18 0905)  sodium bicarbonate injection 50 mEq (50 mEq Intravenous Given 01/23/18 0742)  fentaNYL (SUBLIMAZE) injection 50 mcg (50 mcg Intravenous Given 01/23/18 0938)  lidocaine (XYLOCAINE) 2 % injection (10 mLs Infiltration Given 01/23/18 0954)  fentaNYL (SUBLIMAZE) injection 50 mcg (50 mcg Intravenous Given 01/23/18 1229)  diltiazem (CARDIZEM) injection 10 mg (10 mg Intravenous Given  01/23/18 1236)  diltiazem (CARDIZEM) 100 mg in dextrose 5% 167mL (1 mg/mL) infusion (5 mg/hr Intravenous New Bag/Given 01/23/18 1243)     Initial Impression / Assessment and Plan / ED Course  I have reviewed the triage vital signs and the nursing notes.  Pertinent labs & imaging results that were available during my care of the patient were reviewed by me and considered in my medical decision making (see chart for details).     Case Vassell is a 57 y.o. male here with abdominal pain. He has hx of cirrhosis and is a dialysis patient. He had recent paracentesis that showed no SBP. Will get labs, acute abdominal series to r/o free air. Will give pain meds and likely need dialysis today.   8 AM K 6.0 with peaked T waves already. Given calcium, bicarb. I called Dr. Joelyn Oms from nephrology to dialyze patient. Patient's xray showed large volume cirrhosis again. Just had paracentesis 2 days ago and another one a week ago. I ordered another paracentesis. May need to consider TIPs or drain since he requires frequent paracentesis. Will admit.   8:30 AM Hospitalist saw patient. Will expedite dialysis and paracentesis. They will call case management to arrange for regular paracentesis on an outpatient bases.   12: 30 pm Patient came back from paracentesis. Went into rapid afib rate 160s.  I tried vagal maneuvers but was unsuccessful. I ordered cardizem IV bolus and drip. Paged hospitalist to admit.   Final Clinical Impressions(s) / ED Diagnoses   Final diagnoses:  Ascites  Hyperkalemia  Atrial fibrillation with RVR [facility])    ED Discharge Orders    None       Drenda Freeze, MD 01/23/18 1302

## 2018-01-23 NOTE — ED Notes (Signed)
Pt requested for this nurse to call [facility] to see if he left his phone charger when he was admitted to that floor.  Spoke to Special educational needs teacher on the floor and informed this nurse that there is no charger located from room 12 which is where pt stayed during his admission.

## 2018-01-23 NOTE — ED Notes (Signed)
Spoke with Dyanne Carrel regarding pt tachy in the 160's, 10/10 pain. She advised he takes 120mg  cardizem at home and said they ED provider might want to order some for him. She also said he was to be discharged after paracentesis by ED provider.

## 2018-01-23 NOTE — ED Triage Notes (Addendum)
Pt presents from home via EMS with complaints of abdominal pain. No N/V/D. Dialysis pt.  M,W,F.  Complete dialysis on Friday.  Was discharged from our facility yesterday for same.  Pt states he cannot go to dialysis today because he feels too bad.  Access on the right

## 2018-01-23 NOTE — ED Notes (Signed)
Patient reports that he does not make urine

## 2018-01-23 NOTE — ED Notes (Signed)
Per [place] RN, Dyanne Carrel admitting NP gave her instruction to stop cardizem gtt once pt is taking cardizem PO as ordered.  She also mentioned that pharmacy stated that the cardizem gtt may be stopped 2 hours after PO cardizem is administered.

## 2018-01-23 NOTE — Progress Notes (Addendum)
CSW aware of consult. CSW spoke with EDP who states that patient may require inpatient stay. EDP states that if patient is discharged home he will need assistance with transportation. If patient is discharged from ED please contact [facility] regarding bus pass or taxi voucher, due to [facility] being off campus.   Ollen Barges, [place] Work Department  [employer]  [phone]

## 2018-01-23 NOTE — Care Management Note (Signed)
Case Management Note  CM consulted for assistance with outpt serial IR paracentesis appointments, every 3 days.  CM spoke with IR, [phone], who advised outpt scheduling is to be done at [phone].  They advised there has to be an ancillary order placed and then the pt or the provider can schedule the appointment.  CM updated Black, NP with information.  Contact for appointment scheduling placed on pt's AVS.  No further CM needs noted at this time.  Martez Weiand, Benjaman Lobe, RN 01/23/2018, 9:13 AM

## 2018-01-23 NOTE — ED Notes (Signed)
Admitting provider made aware of pt critical troponin value.

## 2018-01-23 NOTE — ED Notes (Signed)
Pt ambulated to the BR with steady gait.  Pt was made aware that it's not a good idea to get up d/t his elevated HR.  He declined a urinal , states he needs to have a BM.  BSC offered, he declined.

## 2018-01-23 NOTE — Consult Note (Signed)
[name] [facility] Admit Date: 01/23/2018 01/23/2018 [name] Requesting Physician:  Darl Householder MD  Reason for Consult:  ESRD, Hyperkalemia, hypervolemia  HPI:  57 year old male with ESRD who presented to the emergency room earlier today with abdominal pain.  Past history includes cirrhosis with ascites, history of SBP on prophylactic ciprofloxacin, polysubstance abuse, COPD, hypertension, history of coronary artery disease, history of atrial fibrillation/flutter.  Patient was admitted to the hospital end of last week for similar complaints.  He underwent a 2.1 L paracentesis with no evidence of peritonitis and was discharged home yesterday.  He represented this morning with worsened abdominal pain.  Patient was evaluated in the emergency room by internal medicine and large volume paracentesis was arranged.  After the patient returned to the emergency room centesis he developed a tachycardia with heart rate in the 150s and 160s.  Placed on a diltiazem drip and given a 0.5 L bolus of normal saline.  The patient is awake, alert, argumentative.  Denies any acute issues with hemodialysis.  He attended all 3 treatments last week and received at least 90% of the prescribed time.  Labs in the emergency room demonstrate a potassium of 6.0, BUN 35, bicarbonate 23, hemoglobin 10.5.  He has a tunneled dialysis catheter and a mature AV graft in the right upper extremity but refuses to permit cannulation after severe complication of his left upper arm fistula.  Outpt HD Orders Unit: [facility] Days: [facility] Time: 4h Dialyzer: F180 EDW: 67.5kg K/Ca: 2/2 Access: TDC, has AVG hasn't permitted its use yet Needle Size: n/a BFR/DFR: 400/800 UF Proflie: none VDRA: C3 0.22mcg qTx EPO: Mircera 146mcg q2wk, last given 7/3 IV Fe: none Heparin: none Treatment Adherence: poor, often s/o   Creat (mg/dL)  Date Value  02/15/2017 9.18 (H)  11/16/2016 8.59 (H)   Creatinine, Ser (mg/dL)  Date Value  01/23/2018  9.15 (H)  01/21/2018 5.30 (H)  01/20/2018 5.09 (H)  01/02/2018 9.10 (H)  12/30/2017 5.47 (H)  12/29/2017 6.62 (H)  12/23/2017 5.04 (H)  12/18/2017 7.70 (H)  12/07/2017 4.55 (H)  12/06/2017 5.40 (H)  ] I/Os:  ROS Balance of 12 systems is negative w/ exceptions as above  PMH  Past Medical History:  Diagnosis Date  . Anemia   . Anxiety   . Arthritis    left shoulder  . Atherosclerosis of aorta ([place])   . Cardiomegaly   . Chest pain    DATE UNKNOWN, C/O PERIODICALLY  . Cocaine abuse ([place])   . COPD exacerbation ([place]) 08/17/2016  . Coronary artery disease    stent 02/22/17  . ESRD (end stage renal disease) on dialysis ([place])    "[name]; MWF" (07/04/2017)  . GERD (gastroesophageal reflux disease)    DATE UNKNOWN  . Hemorrhoids   . Hepatitis B, chronic ([place])   . Hepatitis C   . History of kidney stones   . Hyperkalemia   . Hypertension   . Metabolic bone disease    Patient denies  . Mitral stenosis   . Myocardial infarction ([place])   . Pneumonia   . Pulmonary edema   . Solitary rectal ulcer syndrome 07/2017   at flex sig for rectal bleeding  . Tubular adenoma of colon    PSH  Past Surgical History:  Procedure Laterality Date  . A/V FISTULAGRAM Left 05/26/2017   Procedure: A/V FISTULAGRAM;  Surgeon: Conrad Damascus, MD;  Location: [place] [facility];  Service: Cardiovascular;  Laterality: Left;  . A/V FISTULAGRAM Right 11/18/2017  Procedure: A/V FISTULAGRAM - Right Arm;  Surgeon: Elam Dutch, MD;  Location: [place] [facility];  Service: Cardiovascular;  Laterality: Right;  . APPLICATION OF WOUND VAC Left 06/14/2017   Procedure: APPLICATION OF WOUND VAC;  Surgeon: Katha Cabal, MD;  Location: [facility];  Service: Vascular;  Laterality: Left;  . AV FISTULA PLACEMENT  2012   BELIEVED WAS PLACED IN JUNE  . AV FISTULA PLACEMENT Right 08/09/2017   Procedure: Creation Right arm ARTERIOVENOUS BRACHIOCEPOHALIC FISTULA;  Surgeon: Elam Dutch, MD;  Location: [facility]  OR;  Service: Vascular;  Laterality: Right;  . AV FISTULA PLACEMENT Right 11/22/2017   Procedure: INSERTION OF ARTERIOVENOUS (AV) GORE-TEX GRAFT RIGHT UPPER ARM;  Surgeon: Elam Dutch, MD;  Location: [place];  Service: Vascular;  Laterality: Right;  . COLONOSCOPY    . CORONARY STENT INTERVENTION N/A 02/22/2017   Procedure: CORONARY STENT INTERVENTION;  Surgeon: Nigel Mormon, MD;  Location: [place] [facility];  Service: Cardiovascular;  Laterality: N/A;  . FLEXIBLE SIGMOIDOSCOPY N/A 07/15/2017   Procedure: FLEXIBLE SIGMOIDOSCOPY;  Surgeon: Carol Ada, MD;  Location: [place];  Service: Endoscopy;  Laterality: N/A;  . HEMORRHOID BANDING    . I&D EXTREMITY Left 06/01/2017   Procedure: IRRIGATION AND DEBRIDEMENT LEFT ARM HEMATOMA WITH LIGATION OF LEFT ARM AV FISTULA;  Surgeon: Elam Dutch, MD;  Location: [place];  Service: Vascular;  Laterality: Left;  . I&D EXTREMITY Left 06/14/2017   Procedure: IRRIGATION AND DEBRIDEMENT EXTREMITY;  Surgeon: Katha Cabal, MD;  Location: [facility];  Service: Vascular;  Laterality: Left;  . INSERTION OF DIALYSIS CATHETER  05/30/2017  . INSERTION OF DIALYSIS CATHETER N/A 05/30/2017   Procedure: INSERTION OF DIALYSIS CATHETER;  Surgeon: Elam Dutch, MD;  Location: [place];  Service: Vascular;  Laterality: N/A;  . IR PARACENTESIS  08/30/2017  . IR PARACENTESIS  09/29/2017  . IR PARACENTESIS  10/28/2017  . IR PARACENTESIS  11/09/2017  . IR PARACENTESIS  11/16/2017  . IR PARACENTESIS  11/28/2017  . IR PARACENTESIS  12/01/2017  . IR PARACENTESIS  12/06/2017  . IR PARACENTESIS  01/03/2018  . IR PARACENTESIS  01/23/2018  . LEFT HEART CATH AND CORONARY ANGIOGRAPHY N/A 02/22/2017   Procedure: LEFT HEART CATH AND CORONARY ANGIOGRAPHY;  Surgeon: Nigel Mormon, MD;  Location: [place] [facility];  Service: Cardiovascular;  Laterality: N/A;  . LIGATION OF ARTERIOVENOUS  FISTULA Left 07/20/3788   Procedure: Plication of Left Arm Arteriovenous Fistula;   Surgeon: Elam Dutch, MD;  Location: [place];  Service: Vascular;  Laterality: Left;  . POLYPECTOMY    . REVISON OF ARTERIOVENOUS FISTULA Left 2/40/9735   Procedure: PLICATION OF DISTAL ANEURYSMAL SEGEMENT OF LEFT UPPER ARM ARTERIOVENOUS FISTULA;  Surgeon: Elam Dutch, MD;  Location: [place];  Service: Vascular;  Laterality: Left;  . REVISON OF ARTERIOVENOUS FISTULA Left 10/07/9240   Procedure: Plication of Left Upper Arm Fistula ;  Surgeon: Waynetta Sandy, MD;  Location: [place];  Service: Vascular;  Laterality: Left;  . SKIN GRAFT SPLIT THICKNESS LEG / FOOT Left    SKIN GRAFT SPLIT THICKNESS LEFT ARM DONOR SITE: LEFT ANTERIOR THIGH  . SKIN SPLIT GRAFT Left 07/04/2017   Procedure: SKIN GRAFT SPLIT THICKNESS LEFT ARM DONOR SITE: LEFT ANTERIOR THIGH;  Surgeon: Elam Dutch, MD;  Location: [place];  Service: Vascular;  Laterality: Left;  . THROMBECTOMY W/ EMBOLECTOMY Left 06/05/2017   Procedure: EXPLORATION OF LEFT ARM FOR BLEEDING; OVERSEWED PROXIMAL FISTULA;  Surgeon: Angelia Mould, MD;  Location: [facility] OR;  Service: Vascular;  Laterality: Left;  . WOUND EXPLORATION Left 06/03/2017   Procedure: WOUND EXPLORATION WITH WOUND VAC APPLICATION TO LEFT ARM;  Surgeon: Angelia Mould, MD;  Location: [facility] OR;  Service: Vascular;  Laterality: Left;   FH  Family History  Problem Relation Age of Onset  . Heart disease Mother   . Lung cancer Mother   . Heart disease Father   . Malignant hyperthermia Father   . COPD Father   . Throat cancer Sister   . Esophageal cancer Sister   . Hypertension Other   . COPD Other   . Colon cancer Neg Hx   . Colon polyps Neg Hx   . Rectal cancer Neg Hx   . Stomach cancer Neg Hx    SH  reports that he has been smoking cigarettes.  He started smoking about 44 years ago. He has a 21.50 pack-year smoking history. He has never used smokeless tobacco. He reports that he drank alcohol. He reports that he has current or past drug history. Drug:  Marijuana. Allergies  Allergies  Allergen Reactions  . Morphine And Related Other (See Comments)    Stomach pain  . Aspirin Other (See Comments)    STOMACH PAIN  . Clonidine Derivatives Itching  . Tramadol Itching  . Tylenol [Acetaminophen] Nausea Only    Stomach ache   Home medications Prior to Admission medications   Medication Sig Start Date End Date Taking? Authorizing Provider  albuterol (VENTOLIN HFA) 108 (90 Base) MCG/ACT inhaler Inhale 2 puffs into the lungs every 6 (six) hours as needed for wheezing or shortness of breath.   Yes [provider]  ciprofloxacin (CIPRO) 500 MG tablet Take 500 mg by mouth at bedtime.   Yes [provider]  clopidogrel (PLAVIX) 75 MG tablet Take 75 mg by mouth daily.   Yes [provider]  diltiazem (CARDIZEM CD) 120 MG 24 hr capsule Take 1 capsule (120 mg total) by mouth daily. 06/23/17 06/23/18 Yes Shahmehdi, Seyed A, MD  gabapentin (NEURONTIN) 100 MG capsule Take 100 mg by mouth 2 (two) times daily. 12/13/17  Yes [provider]  hydrALAZINE (APRESOLINE) 100 MG tablet Take 100 mg by mouth 2 (two) times daily. Taking after HD on Dialysis days   Yes [provider]  lactulose, encephalopathy, (CHRONULAC) 10 GM/15ML SOLN Take 15 ml's by mouth twice daily. 01/17/18  Yes Esterwood, Amy S, PA-C  oxyCODONE (OXY IR/ROXICODONE) 5 MG immediate release tablet Take 5 mg by mouth 2 (two) times daily as needed for pain. 12/13/17  Yes [provider]  sevelamer carbonate (RENVELA) 800 MG tablet Take 4 tablets with meals  And 2 tablets twice daily with snacks 01/22/18  Yes Patrecia Pour, MD  traZODone (DESYREL) 150 MG tablet Take 150 mg by mouth at bedtime as needed for sleep.   Yes [provider]    Current Medications Scheduled Meds: . ciprofloxacin  500 mg Oral QHS  . clopidogrel  75 mg Oral Daily  . diltiazem  120 mg Oral Daily  . gabapentin  100 mg Oral BID  . hydrALAZINE  100 mg Oral BID  .  lactulose (encephalopathy)  20 g Oral BID  . sevelamer carbonate  800 mg Oral TID WC   Continuous Infusions: PRN Meds:.acetaminophen **OR** acetaminophen, albuterol, bisacodyl, ondansetron **OR** ondansetron (ZOFRAN) IV, oxyCODONE, traZODone  CBC Recent Labs  Lab 01/20/18 2214 01/21/18 0500 01/23/18 0627  WBC 8.7  6.6 9.8  HGB 10.2* 9.8* 10.5*  HCT 30.4* 29.8* 31.1*  MCV 80.6 80.8 80.2  PLT 98* 68* 89*   Basic Metabolic Panel Recent Labs  Lab 01/20/18 2214 01/21/18 0500 01/23/18 0627  NA 134* 134* 130*  K 3.7 4.3 6.0*  CL 93* 91* 91*  CO2 28 29 23   GLUCOSE 134* 92 93  BUN 15 17 35*  CREATININE 5.09* 5.30* 9.15*  CALCIUM 8.6* 8.8* 9.1    Physical Exam  Blood pressure (!) 80/41, pulse 98, temperature 98.7 F (37.1 C), temperature source Oral, resp. rate 18, SpO2 96 %. GEN: NAD, lying in bed, chronically ill appearing, argumentative ENT: NCAT EYES: EOMI, anicteric CV: tachy, regular PULM: CTAB ABD: soft, mild distension SKIN: No rashes/lesions EXT:No LEE RUE AVG +B/T   Assessment 53M with abd pain, ascites from cirrhosis, Afib/flutter, ESRD MWF  1. ESRD MWF [place] 2. Afib/flutter, on diltiazem drip 3. Ascites, req recurrent LVP 4. Cirrhosis, HBV, HCV, hx/o ETOH 5. Hx/o PSA 6. CAD 7. Hx/o SBP  Plan 1. HD today, 2K, 1L UF given hemodynamics BP permitting, no heparin, use TDC; isolation Medication Issues;  Preferred narcotic agents for pain control are hydromorphone, fentanyl, and methadone. Morphine should not be used. Oxydone is not preferred.   Baclofen should not be used.  Avoid Fleets and Magnesium Citrate Bowel Preps   Pearson Grippe MD [phone] [phone] 01/23/2018, 1:20 PM

## 2018-01-23 NOTE — ED Notes (Signed)
Reported pt hr in 160's and 10/10 pain to Dr Darl Householder. Waiting for orders.

## 2018-01-23 NOTE — ED Notes (Signed)
Pt refusing pain medication that is ordered, pt requesting dilaudid. Pt states "Ain't none of that work for me, and if you can't give me anything stronger then I need another doctor".  Admitting provider aware.

## 2018-01-23 NOTE — ED Notes (Signed)
Patient transported to IR 

## 2018-01-23 NOTE — ED Notes (Signed)
Pt was notified that the attending MD/NP was made aware re Dilaudid is the only pain med that helps his pain.  He states "sons of bitches!"  "I am not coming here next time.  So, they're not going to help me and just let me suffer."  Pt then agreed to take pain med as ordered.  Then states "I need 3 of those."

## 2018-01-23 NOTE — Progress Notes (Signed)
Pt arrived from ER on stretcher, ambulated with stand by assist to bed.  Bedside monitor applied, a-fib RVR 130s.  Complains of pain, states he needs the Dilaudid because it is the only thing that works.  Skin assessment complete with Hoyle Sauer, RN.  Bath & CHG completed.  IV SL to left forearm.  Pt oriented to room, will continue to monitor.

## 2018-01-24 ENCOUNTER — Encounter (HOSPITAL_COMMUNITY)
Admission: EM | Discharge status: Against Medical Advice | Payer: Self-pay | Source: Home / Self Care | Attending: Emergency Medicine

## 2018-01-24 ENCOUNTER — Other Ambulatory Visit: Payer: Self-pay

## 2018-01-24 ENCOUNTER — Ambulatory Visit (HOSPITAL_COMMUNITY): Admission: RE | Admit: 2018-01-24 | Payer: Medicare Other | Source: Ambulatory Visit | Admitting: Internal Medicine

## 2018-01-24 DIAGNOSIS — R109 Unspecified abdominal pain: Secondary | ICD-10-CM | POA: Diagnosis not present

## 2018-01-24 DIAGNOSIS — B181 Chronic viral hepatitis B without delta-agent: Secondary | ICD-10-CM | POA: Diagnosis not present

## 2018-01-24 DIAGNOSIS — K7031 Alcoholic cirrhosis of liver with ascites: Secondary | ICD-10-CM | POA: Diagnosis not present

## 2018-01-24 DIAGNOSIS — R188 Other ascites: Secondary | ICD-10-CM

## 2018-01-24 DIAGNOSIS — D122 Benign neoplasm of ascending colon: Secondary | ICD-10-CM | POA: Diagnosis not present

## 2018-01-24 DIAGNOSIS — I132 Hypertensive heart and chronic kidney disease with heart failure and with stage 5 chronic kidney disease, or end stage renal disease: Secondary | ICD-10-CM | POA: Diagnosis not present

## 2018-01-24 DIAGNOSIS — K621 Rectal polyp: Secondary | ICD-10-CM | POA: Diagnosis not present

## 2018-01-24 DIAGNOSIS — D124 Benign neoplasm of descending colon: Secondary | ICD-10-CM | POA: Diagnosis not present

## 2018-01-24 DIAGNOSIS — R1084 Generalized abdominal pain: Secondary | ICD-10-CM | POA: Diagnosis not present

## 2018-01-24 DIAGNOSIS — E8770 Fluid overload, unspecified: Secondary | ICD-10-CM | POA: Diagnosis not present

## 2018-01-24 DIAGNOSIS — K648 Other hemorrhoids: Secondary | ICD-10-CM | POA: Diagnosis not present

## 2018-01-24 DIAGNOSIS — I480 Paroxysmal atrial fibrillation: Secondary | ICD-10-CM | POA: Diagnosis not present

## 2018-01-24 DIAGNOSIS — K2970 Gastritis, unspecified, without bleeding: Secondary | ICD-10-CM | POA: Diagnosis not present

## 2018-01-24 DIAGNOSIS — E875 Hyperkalemia: Secondary | ICD-10-CM | POA: Diagnosis not present

## 2018-01-24 DIAGNOSIS — G8929 Other chronic pain: Secondary | ICD-10-CM | POA: Diagnosis not present

## 2018-01-24 DIAGNOSIS — K7460 Unspecified cirrhosis of liver: Secondary | ICD-10-CM | POA: Diagnosis not present

## 2018-01-24 DIAGNOSIS — Z992 Dependence on renal dialysis: Secondary | ICD-10-CM | POA: Diagnosis not present

## 2018-01-24 DIAGNOSIS — N186 End stage renal disease: Secondary | ICD-10-CM | POA: Diagnosis not present

## 2018-01-24 DIAGNOSIS — D120 Benign neoplasm of cecum: Secondary | ICD-10-CM | POA: Diagnosis not present

## 2018-01-24 DIAGNOSIS — K319 Disease of stomach and duodenum, unspecified: Secondary | ICD-10-CM | POA: Diagnosis not present

## 2018-01-24 LAB — CBC
HCT: 30.9 % — ABNORMAL LOW (ref 39.0–52.0)
Hemoglobin: 10.4 g/dL — ABNORMAL LOW (ref 13.0–17.0)
MCH: 27 pg (ref 26.0–34.0)
MCHC: 33.7 g/dL (ref 30.0–36.0)
MCV: 80.3 fL (ref 78.0–100.0)
Platelets: 132 10*3/uL — ABNORMAL LOW (ref 150–400)
RBC: 3.85 MIL/uL — ABNORMAL LOW (ref 4.22–5.81)
RDW: 18.5 % — ABNORMAL HIGH (ref 11.5–15.5)
WBC: 8.5 10*3/uL (ref 4.0–10.5)

## 2018-01-24 LAB — RENAL FUNCTION PANEL
Albumin: 3.8 g/dL (ref 3.5–5.0)
Anion gap: 15 (ref 5–15)
BUN: 56 mg/dL — ABNORMAL HIGH (ref 6–20)
CO2: 26 mmol/L (ref 22–32)
Calcium: 8.7 mg/dL — ABNORMAL LOW (ref 8.9–10.3)
Chloride: 90 mmol/L — ABNORMAL LOW (ref 98–111)
Creatinine, Ser: 10.63 mg/dL — ABNORMAL HIGH (ref 0.61–1.24)
GFR calc Af Amer: 6 mL/min — ABNORMAL LOW (ref >60)
GFR calc non Af Amer: 5 mL/min — ABNORMAL LOW (ref >60)
Glucose, Bld: 116 mg/dL — ABNORMAL HIGH (ref 70–99)
Phosphorus: 6.4 mg/dL — ABNORMAL HIGH (ref 2.5–4.6)
Potassium: 6.1 mmol/L — ABNORMAL HIGH (ref 3.5–5.1)
Sodium: 131 mmol/L — ABNORMAL LOW (ref 135–145)

## 2018-01-24 SURGERY — COLONOSCOPY WITH PROPOFOL
Anesthesia: Monitor Anesthesia Care

## 2018-01-24 MED ORDER — OXYCODONE HCL 5 MG PO TABS
5.0000 mg | ORAL_TABLET | Freq: Once | ORAL | Status: AC
  Administered 2018-01-24: 5 mg via ORAL
  Filled 2018-01-24: qty 1

## 2018-01-24 MED ORDER — METOCLOPRAMIDE HCL 5 MG/ML IJ SOLN
10.0000 mg | Freq: Once | INTRAMUSCULAR | Status: AC
  Administered 2018-01-24: 10 mg via INTRAVENOUS
  Filled 2018-01-24: qty 2

## 2018-01-24 MED ORDER — PEG-KCL-NACL-NASULF-NA ASC-C 100 G PO SOLR: 1.0000 | Freq: Once | ORAL | Status: DC

## 2018-01-24 MED ORDER — POLYETHYLENE GLYCOL 3350 17 GM/SCOOP PO POWD: 1.0000 | Freq: Once | ORAL | Status: DC

## 2018-01-24 MED ORDER — PEG-KCL-NACL-NASULF-NA ASC-C 100 G PO SOLR
0.5000 | Freq: Once | ORAL | Status: AC
  Administered 2018-01-25: 100 g via ORAL
  Filled 2018-01-24: qty 1

## 2018-01-24 MED ORDER — BISACODYL 5 MG PO TBEC: 20.0000 mg | DELAYED_RELEASE_TABLET | Freq: Every day | ORAL | Status: DC | PRN

## 2018-01-24 MED ORDER — PEG-KCL-NACL-NASULF-NA ASC-C 100 G PO SOLR
0.5000 | Freq: Once | ORAL | Status: AC
  Administered 2018-01-24: 100 g via ORAL
  Filled 2018-01-24: qty 1

## 2018-01-24 NOTE — Progress Notes (Signed)
PROGRESS NOTE    [name]  XBD:[id number] DOB: [birth year] DOA: 01/23/2018 PCP: [name], MD   Brief Narrative:57 y.o. 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 Initial evaluation reveals ascites and creatinine of 9 and potassium 6. [facility] are asked to admit.  Information is obtained from the patient and the chart. He states he was discharged yesterday after a 2 day hospitalization for ascites and atrial fib/flutter with RVR. He had 2.1 L of clear yellow fluid via paracentesis on July 13. He is unclear of the size of his abdomen at discharge yesterday. He states in the few hours he's been gone he developed abdominal pain. Unclear abdomen is any bigger. Describes the pain as constant scribes as a pressure located throughout his abdomen. He denies any nausea vomiting fevers headache chills. He reports his abdominal discomfort so intense today he could not tolerate dialysis. He did attend dialysis 2 days ago is a Monday Wednesday Friday dialysis patient [facility] denies chest pain palpitation shortness of breath or lower extremity edema. He denies diarrhea constipation melena bright red blood per rectum.    Assessment & Plan:   Principal Problem:   Abdominal pain Active Problems:   Chronic hepatitis B (HCC)   Chronic hepatitis C without hepatic coma (HCC)   Thrombocytopenia (HCC)   Essential hypertension   Anemia   Ascites   COPD (chronic obstructive pulmonary disease) (HCC)   ESRD (end stage renal disease) (HCC)   Atrial fibrillation (HCC)  1] abdominal pain secondary to alcoholic cirrhosis and ascites patient recently admitted and  discharged after paracentesis 2 days ago.  He had repeat paracentesis done in the emergency room followed by hemorrhage he developed A. fib RVR.  I have placed an order for outpatient standing order for paracentesis  to be done by IR upon discharge.  2] end-stage renal disease on dialysis Monday Wednesday Friday.  3] rectal bleeding with a history of adenomatous colonic polyps.  Plan to do EGD and flexible sigmoidoscopy tomorrow.  4] history of CAD DES stent patient stopped taking Plavix long time ago.dw cadriologist dr Virgina Jock no need for plavix.dc plavix.his stent was a year ago.  5] A. fib RVR after paracentesis.  Currently controlled.   DVT prophylaxis:HEPARIN Code Status: dnr Family Communication:none  Disposition Plan:dc past gi work up.please make sure prescription send for  outpatient paracentesis as a standing order.i have ordered it.just need to continue on dc. Consultants:  Gi,renal Procedures:none Antimicrobialsnone  Subjective:resting in bed c/o shoulder pain   Objective: Vitals:   01/24/18 0450 01/24/18 0500 01/24/18 0904 01/24/18 1250  BP: (!) 163/66  112/64 128/82  Pulse:   77 84  Resp: (!) 22  20 20   Temp:   98.2 F (36.8 C) (!) 97.3 F (36.3 C)  TempSrc:   Oral Oral  SpO2: 95%  91% 97%  Weight:  67.9 kg (149 lb 12.8 oz)    Height:        Intake/Output Summary (Last 24 hours) at 01/24/2018 1344 Last data filed at 01/24/2018 0900 Gross per 24 hour  Intake 419.39 ml  Output -  Net 419.39 ml   Filed Weights   01/23/18 1830 01/24/18 0500  Weight: 68.3 kg (150 lb 9.2 oz)  67.9 kg (149 lb 12.8 oz)    Examination:  General exam: Appears calm and comfortable  Respiratory system: Clear to auscultation. Respiratory effort normal. Cardiovascular system: S1 & S2 heard, RRR. No JVD, murmurs, rubs, gallops or clicks. No pedal edema. Gastrointestinal system: Abdomen is nondistended, soft and nontender. No organomegaly or masses felt. Normal bowel sounds  heard. Central nervous system: Alert and oriented. No focal neurological deficits. Extremities: Symmetric 5 x 5 power. Skin: No rashes, lesions or ulcers Psychiatry: Judgement and insight appear normal. Mood & affect appropriate.     Data Reviewed: I have personally reviewed following labs and imaging studies  CBC: Recent Labs  Lab 01/20/18 2214 01/21/18 0500 01/23/18 0627  WBC 8.7 6.6 9.8  HGB 10.2* 9.8* 10.5*  HCT 30.4* 29.8* 31.1*  MCV 80.6 80.8 80.2  PLT 98* 68* 89*   Basic Metabolic Panel: Recent Labs  Lab 01/20/18 2214 01/21/18 0500 01/23/18 0627  NA 134* 134* 130*  K 3.7 4.3 6.0*  CL 93* 91* 91*  CO2 28 29 23   GLUCOSE 134* 92 93  BUN 15 17 35*  CREATININE 5.09* 5.30* 9.15*  CALCIUM 8.6* 8.8* 9.1   GFR: Estimated Creatinine Clearance: 8.8 mL/min (A) (by C-G formula based on SCr of 9.15 mg/dL (H)). Liver Function Tests: Recent Labs  Lab 01/20/18 2214 01/23/18 0627  AST 24 21  ALT 11 12  ALKPHOS 189* 159*  BILITOT 0.8 1.1  PROT 7.1 6.9  ALBUMIN 3.8 3.9   Recent Labs  Lab 01/20/18 2214 01/23/18 0627  LIPASE 43 29   No results for input(s): AMMONIA in the last 168 hours. Coagulation Profile: Recent Labs  Lab 01/21/18 0249  INR 1.26   Cardiac Enzymes: No results for input(s): CKTOTAL, CKMB, CKMBINDEX, TROPONINI in the last 168 hours. BNP (last 3 results) No results for input(s): PROBNP in the last 8760 hours. HbA1C: No results for input(s): HGBA1C in the last 72 hours. CBG: No results for input(s): GLUCAP in the last 168 hours. Lipid Profile: No results for input(s): CHOL, HDL, LDLCALC, TRIG, CHOLHDL, LDLDIRECT in the last 72 hours. Thyroid Function Tests: No results for input(s): TSH, T4TOTAL, FREET4, T3FREE, THYROIDAB in the last 72 hours. Anemia Panel: No results for input(s): VITAMINB12, FOLATE, FERRITIN, TIBC, IRON, RETICCTPCT in the last 72 hours. Sepsis Labs: No results for input(s): PROCALCITON, LATICACIDVEN in the last 168  hours.  Recent Results (from the past 240 hour(s))  MRSA PCR Screening     Status: None   Collection Time: 01/21/18  7:40 AM  Result Value Ref Range Status   MRSA by PCR NEGATIVE NEGATIVE Final    Comment:        The GeneXpert MRSA Assay (FDA approved for NASAL specimens only), is one component of a comprehensive MRSA colonization surveillance program. It is not intended to diagnose MRSA infection nor to guide or monitor treatment for MRSA infections. Performed at [facility], [place] [address], [place], [place] [address]   Culture, blood (Routine X 2) w Reflex to ID Panel     Status: None (Preliminary result)   Collection Time: 01/21/18  8:14 AM  Result Value Ref Range Status   Specimen Description BLOOD LEFT HAND  Final   Special Requests   Final    BOTTLES DRAWN AEROBIC ONLY Blood Culture adequate volume   Culture   Final    NO GROWTH 2 DAYS Performed at [facility], [place] [address] [place], [place] [address]  Report Status PENDING  Incomplete  Culture, blood (Routine X 2) w Reflex to ID Panel     Status: None (Preliminary result)   Collection Time: 01/21/18  8:16 AM  Result Value Ref Range Status   Specimen Description BLOOD LEFT HAND  Final   Special Requests   Final    BOTTLES DRAWN AEROBIC ONLY Blood Culture results may not be optimal due to an inadequate volume of blood received in culture bottles   Culture   Final    NO GROWTH 2 DAYS Performed at [facility], [place] [address], [place], [place] [address]    Report Status PENDING  Incomplete  Culture, body fluid-bottle     Status: None (Preliminary result)   Collection Time: 01/21/18 12:05 PM  Result Value Ref Range Status   Specimen Description PERITONEAL  Final   Special Requests NONE  Final   Culture   Final    NO GROWTH 2 DAYS Performed at [place] [address] [place], [place] [address]    Report Status PENDING  Incomplete  Gram stain     Status: None   Collection Time:  01/21/18 12:05 PM  Result Value Ref Range Status   Specimen Description PERITONEAL  Final   Special Requests NONE  Final   Gram Stain   Final    RARE WBC PRESENT, PREDOMINANTLY MONONUCLEAR NO ORGANISMS SEEN Performed at [facility], [address], [place], [place] [address]    Report Status 01/21/2018 FINAL  Final         Radiology Studies: Dg Abd Acute W/chest  Result Date: 01/23/2018 CLINICAL DATA:  Abdominal pain and distention. EXAM: DG ABDOMEN ACUTE W/ 1V CHEST COMPARISON:  CT scan of the abdomen dated 01/02/2018 and chest x-ray dated 12/30/2017 FINDINGS: There are no dilated loops of bowel. Extensive ascites in the abdomen. Extensive arterial vascular calcifications throughout the abdomen. Chronic cardiomegaly. Double lumen central venous catheter in place with the tips in the right atrium, unchanged. Minimal blunting of the left costophrenic angle laterally. Small metallic device in the rectum, unchanged since the prior CT scan. IMPRESSION: Prominent ascites.  No significant change since the prior CT scan. Electronically Signed   By: Lorriane Shire M.D.   On: 01/23/2018 07:40   Ir Paracentesis  Result Date: 01/23/2018 INDICATION: Ascites secondary to alcoholic cirrhosis. Request of therapeutic paracentesis. EXAM: ULTRASOUND GUIDED therapeutic PARACENTESIS MEDICATIONS: 10 mL 2% lidocaine COMPLICATIONS: None immediate. PROCEDURE: Informed written consent was obtained from the patient after a discussion of the risks, benefits and alternatives to treatment. A timeout was performed prior to the initiation of the procedure. Initial ultrasound scanning demonstrates a moderate amount of ascites within the left lower abdominal quadrant. The left lower abdomen was prepped and draped in the usual sterile fashion. 2% lidocaine was used for local anesthesia. Following this, a 19 gauge, 7-cm, Yueh catheter was introduced. An ultrasound image was saved for documentation purposes. The  paracentesis was performed. The catheter was removed and a dressing was applied. The patient tolerated the procedure well without immediate post procedural complication. FINDINGS: A total of approximately 1.6L of clear yellow fluid was removed. Samples were not sent to the laboratory as requested by the clinical team. IMPRESSION: Successful ultrasound-guided paracentesis yielding 1.6 liters of peritoneal fluid. Read by Candiss Norse, PA-C Electronically Signed   By: Markus Daft M.D.   On: 01/23/2018 10:19        Scheduled Meds: . Chlorhexidine Gluconate Cloth  6 each Topical Q0600  . ciprofloxacin  500 mg Oral QHS  . clopidogrel  75 mg Oral Daily  . gabapentin  100 mg Oral BID  . hydrALAZINE  100 mg Oral BID  . lactulose  20 g Oral BID  . metoCLOPramide (REGLAN) injection  10 mg Intravenous Once   Followed by  . metoCLOPramide (REGLAN) injection  10 mg Intravenous Once  . peg 3350 powder  0.5 kit Oral Once   And  . peg 3350 powder  0.5 kit Oral Once  . sevelamer carbonate  800 mg Oral TID WC   Continuous Infusions: . sodium chloride    . sodium chloride    . diltiazem (CARDIZEM) infusion Stopped (01/23/18 2300)     LOS: 0 days     Georgette Shell, MD [facility]  If 7PM-7AM, please contact night-coverage [email or web address] Password [email or web address] 01/24/2018, 1:44 PM

## 2018-01-24 NOTE — Procedures (Signed)
I was present at this dialysis session. I have reviewed the session itself and made appropriate changes.   For EGD and CSY tomorrow.  2K bath, near EDW, try for 2.5L UF; probe down EDW.   Filed Weights   01/23/18 1830 01/24/18 0500  Weight: 68.3 kg (150 lb 9.2 oz) 67.9 kg (149 lb 12.8 oz)    Recent Labs  Lab 01/23/18 0627  NA 130*  K 6.0*  CL 91*  CO2 23  GLUCOSE 93  BUN 35*  CREATININE 9.15*  CALCIUM 9.1    Recent Labs  Lab 01/20/18 2214 01/21/18 0500 01/23/18 0627  WBC 8.7 6.6 9.8  HGB 10.2* 9.8* 10.5*  HCT 30.4* 29.8* 31.1*  MCV 80.6 80.8 80.2  PLT 98* 68* 89*    Scheduled Meds: . Chlorhexidine Gluconate Cloth  6 each Topical Q0600  . ciprofloxacin  500 mg Oral QHS  . gabapentin  100 mg Oral BID  . hydrALAZINE  100 mg Oral BID  . lactulose  20 g Oral BID  . metoCLOPramide (REGLAN) injection  10 mg Intravenous Once   Followed by  . metoCLOPramide (REGLAN) injection  10 mg Intravenous Once  . peg 3350 powder  0.5 kit Oral Once   And  . peg 3350 powder  0.5 kit Oral Once  . sevelamer carbonate  800 mg Oral TID WC   Continuous Infusions: . sodium chloride    . sodium chloride    . diltiazem (CARDIZEM) infusion Stopped (01/23/18 2300)   PRN Meds:.sodium chloride, sodium chloride, acetaminophen **OR** acetaminophen, albuterol, alteplase, bisacodyl, heparin, lidocaine (PF), lidocaine-prilocaine, ondansetron **OR** ondansetron (ZOFRAN) IV, oxyCODONE, pentafluoroprop-tetrafluoroeth, traZODone   Pearson Grippe  MD 01/24/2018, 2:13 PM

## 2018-01-24 NOTE — Progress Notes (Signed)
HD tx rescheduled for 7/16 d/t pt's HEP B + status/census/current needs, spoke w/ ED nurse about this on dayshift but she apparently did not pass along this information to receiving nurse on 2W. Called primary nurse to make sure she was aware of the change and she was not, made her aware, she then made me aware that pt was in and out of afib RVR so pt wouldn't have been able to come up to unit w/ that acuity at this time any way. HD CN on day shift will check/re eval pt's status on day shift when she comes in.

## 2018-01-24 NOTE — Consult Note (Addendum)
[facility] Consult: 10:21 AM 01/24/2018  LOS: 0 days    Referring Provider: Dr Zigmund Daniel  Primary Care Physician:  [name], MD Primary Gastroenterologist:  Dr. Hilarie Fredrickson.      Reason for Consultation:  Abdominal pain, hx passing BPR.     HPI: [name] is a 57 y.o. male.  PMH ESRD, HD TTS.  Cirrhosis of the liver from hepatitis B (chronic active) and C (completed [place] 11/2016).  Ascites.  Previous SBP on chronic prophylactic Cipro. Chronic abdominal pain. Polysubstance abuse.  COPD.  CAD, stent placed 2017.  A. Fib/flutter.  On chronic Plavix but not anticoagulation.  Polysubstance, ETOH abuse, in remission. Chronic anemia.   09/2012 Colonoscopy.  Average risk screening first colonoscopy a total of 7 sessile polyps removed ranging in size from 3 to 5 mm (Hyperplastic and adenomatous), small internal hemorrhoids.   2015 Hemorrhoidal banding.  Dr Carlean Purl  11/11/16 Liver elastography with F3/F4 fibrosis.   11/2016 EGD. Variceal screening.  Normal esophagus, no varices. Scattered moderate inflammation/congestion/edema with erosions and erythema throughout the stomach, especially in the antrum.  Biopsies obtained.  Normal duodenum. Pathology showed mild chronic gastritis with erosions.  No H. pylori, no intestinal metaplasia/dysplasia/malignancy. 07/16/2017 flexible sigmoidoscopy for evaluation of hematochezia.  Multiple rectal ulcers consistent with solitary rectal ulcer syndrome.  Endoclips placed.  Transfused 2 U PRBCs ~ 06/19/2017.    Transfused 4 U PRBCs during admission in early 07/2017. Transfused 2 U PRBCs ~ 09/02/2017  S/p innumerable paracentesis of between 600 mL and 5 liters for management of ascites.  Diuretics are not an option given his ESRD.  Market elevation of fluid WBCs noted on  several paracenteses but not recently.  Not a candidate for TIPS due to CAD, ESRD.  Not a transplant candidate.  Chronic abdominal pain with innumerable ED visits and admissions for evaluation.  Recent CT scans confirm cirrhosis, ascites but no particular pathology to explain his abdominal pain.  On a daily basis he takes 25 to 30 mg oxycodone.  Not taking PPI or H2 blocker.  Patient gets relief of abdominal pain following paracentesis and most of the time with oxycodone.  He has at least one bowel movement a day.  Has not been taking lactulose every day.  Smokes pot, 1 joint lasts him about a week.  No ETOH or "hard" drugs.    Patient has been set up for outpatient colonoscopy and EGDs.  However either he either no shows for procedures or is unable to complete adequate bowel prep.  Last attempt at colonoscopy set for 01/03/2018 was canceled when he showed up and had not been able to complete bowel prep and was completing antibiotic treatment for pneumonia.  He was complaining of abdominal pain and Dr. Hilarie Fredrickson did sent him to outpatient radiology for paracentesis, 2.1 L fluid removed. Latest CT abdomen/pelvis with contrast 01/02/2018: Cirrhosis with ascites.  Cardiomegaly, coronary vascular calcification, aortic atherosclerosis.  Chronic renal failure, renal osteodystrophy.  Patient just admitted 7/12 -01/22/2018 with abdominal pain, increased abdominal girth, A. fib/flutter with RVR.  He felt  better following 2.1 L paracentesis which did not show recurrent SBP. He bounced back to the ED within 24 hours again complaining of abdominal pain and swelling.  No nausea, vomiting, diarrhea.  Appetite erratic.  Sometimes, if the pain is persistent, he will not eat for days on and.  Acute abdominal series with chest film showed prominent ascites, metallic device in the rectum (Endo Clip from 07/2017).  Underwent 2.1 L paracentesis.    Fam hx: COPD: father/brother; Esophageal cancer: sister; Heart disease: father,  mother; Hypertension: brother; Lung cancer: mother; Malignant hyperthermia: father; Throat cancer: sister.   Past Medical History:  Diagnosis Date  . Anemia   . Anxiety   . Arthritis    left shoulder  . Atherosclerosis of aorta ([place])   . Cardiomegaly   . Chest pain    DATE UNKNOWN, C/O PERIODICALLY  . Cocaine abuse ([place])   . COPD exacerbation ([place]) 08/17/2016  . Coronary artery disease    stent 02/22/17  . ESRD (end stage renal disease) on dialysis ([place])    "[name]; MWF" (07/04/2017)  . GERD (gastroesophageal reflux disease)    DATE UNKNOWN  . Hemorrhoids   . Hepatitis B, chronic ([place])   . Hepatitis C   . History of kidney stones   . Hyperkalemia   . Hypertension   . Metabolic bone disease    Patient denies  . Mitral stenosis   . Myocardial infarction ([place])   . Pneumonia   . Pulmonary edema   . Solitary rectal ulcer syndrome 07/2017   at flex sig for rectal bleeding  . Tubular adenoma of colon     Past Surgical History:  Procedure Laterality Date  . A/V FISTULAGRAM Left 05/26/2017   Procedure: A/V FISTULAGRAM;  Surgeon: Conrad Woolsey, MD;  Location: [facility];  Service: Cardiovascular;  Laterality: Left;  . A/V FISTULAGRAM Right 11/18/2017   Procedure: A/V FISTULAGRAM - Right Arm;  Surgeon: Elam Dutch, MD;  Location: [facility];  Service: Cardiovascular;  Laterality: Right;  . APPLICATION OF WOUND VAC Left 06/14/2017   Procedure: APPLICATION OF WOUND VAC;  Surgeon: Katha Cabal, MD;  Location: [facility];  Service: Vascular;  Laterality: Left;  . AV FISTULA PLACEMENT  2012   BELIEVED WAS PLACED IN JUNE  . AV FISTULA PLACEMENT Right 08/09/2017   Procedure: Creation Right arm ARTERIOVENOUS BRACHIOCEPOHALIC FISTULA;  Surgeon: Elam Dutch, MD;  Location: [facility];  Service: Vascular;  Laterality: Right;  . AV FISTULA PLACEMENT Right 11/22/2017   Procedure: INSERTION OF ARTERIOVENOUS (AV) GORE-TEX GRAFT RIGHT UPPER ARM;  Surgeon: Elam Dutch, MD;  Location: [facility];  Service: Vascular;  Laterality: Right;  . COLONOSCOPY    . CORONARY STENT INTERVENTION N/A 02/22/2017   Procedure: CORONARY STENT INTERVENTION;  Surgeon: Nigel Mormon, MD;  Location: [facility];  Service: Cardiovascular;  Laterality: N/A;  . FLEXIBLE SIGMOIDOSCOPY N/A 07/15/2017   Procedure: FLEXIBLE SIGMOIDOSCOPY;  Surgeon: Carol Ada, MD;  Location: [facility];  Service: Endoscopy;  Laterality: N/A;  . HEMORRHOID BANDING    . I&D EXTREMITY Left 06/01/2017   Procedure: IRRIGATION AND DEBRIDEMENT LEFT ARM HEMATOMA WITH LIGATION OF LEFT ARM AV FISTULA;  Surgeon: Elam Dutch, MD;  Location: [facility];  Service: Vascular;  Laterality: Left;  . I&D EXTREMITY Left 06/14/2017   Procedure: IRRIGATION AND DEBRIDEMENT EXTREMITY;  Surgeon: Katha Cabal, MD;  Location: [facility];  Service: Vascular;  Laterality: Left;  .  INSERTION OF DIALYSIS CATHETER  05/30/2017  . INSERTION OF DIALYSIS CATHETER N/A 05/30/2017   Procedure: INSERTION OF DIALYSIS CATHETER;  Surgeon: Elam Dutch, MD;  Location: [facility];  Service: Vascular;  Laterality: N/A;  . IR PARACENTESIS  08/30/2017  . IR PARACENTESIS  09/29/2017  . IR PARACENTESIS  10/28/2017  . IR PARACENTESIS  11/09/2017  . IR PARACENTESIS  11/16/2017  . IR PARACENTESIS  11/28/2017  . IR PARACENTESIS  12/01/2017  . IR PARACENTESIS  12/06/2017  . IR PARACENTESIS  01/03/2018  . IR PARACENTESIS  01/23/2018  . LEFT HEART CATH AND CORONARY ANGIOGRAPHY N/A 02/22/2017   Procedure: LEFT HEART CATH AND CORONARY ANGIOGRAPHY;  Surgeon: Nigel Mormon, MD;  Location: [facility];  Service: Cardiovascular;  Laterality: N/A;  . LIGATION OF ARTERIOVENOUS  FISTULA Left 0/02/6760   Procedure: Plication of Left Arm Arteriovenous Fistula;  Surgeon: Elam Dutch, MD;  Location: [facility];  Service: Vascular;  Laterality: Left;  . POLYPECTOMY    . REVISON OF ARTERIOVENOUS FISTULA Left 9/50/9326   Procedure: PLICATION  OF DISTAL ANEURYSMAL SEGEMENT OF LEFT UPPER ARM ARTERIOVENOUS FISTULA;  Surgeon: Elam Dutch, MD;  Location: [facility];  Service: Vascular;  Laterality: Left;  . REVISON OF ARTERIOVENOUS FISTULA Left 01/21/4579   Procedure: Plication of Left Upper Arm Fistula ;  Surgeon: Waynetta Sandy, MD;  Location: [facility];  Service: Vascular;  Laterality: Left;  . SKIN GRAFT SPLIT THICKNESS LEG / FOOT Left    SKIN GRAFT SPLIT THICKNESS LEFT ARM DONOR SITE: LEFT ANTERIOR THIGH  . SKIN SPLIT GRAFT Left 07/04/2017   Procedure: SKIN GRAFT SPLIT THICKNESS LEFT ARM DONOR SITE: LEFT ANTERIOR THIGH;  Surgeon: Elam Dutch, MD;  Location: [facility];  Service: Vascular;  Laterality: Left;  . THROMBECTOMY W/ EMBOLECTOMY Left 06/05/2017   Procedure: EXPLORATION OF LEFT ARM FOR BLEEDING; OVERSEWED PROXIMAL FISTULA;  Surgeon: Angelia Mould, MD;  Location: [facility];  Service: Vascular;  Laterality: Left;  . WOUND EXPLORATION Left 06/03/2017   Procedure: WOUND EXPLORATION WITH WOUND VAC APPLICATION TO LEFT ARM;  Surgeon: Angelia Mould, MD;  Location: [facility];  Service: Vascular;  Laterality: Left;    Prior to Admission medications   Medication Sig Start Date End Date Taking? Authorizing Provider  albuterol (VENTOLIN HFA) 108 (90 Base) MCG/ACT inhaler Inhale 2 puffs into the lungs every 6 (six) hours as needed for wheezing or shortness of breath.   Yes [provider]  ciprofloxacin (CIPRO) 500 MG tablet Take 500 mg by mouth at bedtime.   Yes [provider]  clopidogrel (PLAVIX) 75 MG tablet Take 75 mg by mouth daily.   Yes [provider]  diltiazem (CARDIZEM CD) 120 MG 24 hr capsule Take 1 capsule (120 mg total) by mouth daily. 06/23/17 06/23/18 Yes Shahmehdi, Seyed A, MD  gabapentin (NEURONTIN) 100 MG capsule Take 100 mg by mouth 2 (two) times daily. 12/13/17  Yes [provider]  hydrALAZINE (APRESOLINE) 100 MG tablet Take 100 mg by mouth 2 (two) times daily. Taking  after HD on Dialysis days   Yes [provider]  lactulose, encephalopathy, (CHRONULAC) 10 GM/15ML SOLN Take 15 ml's by mouth twice daily. 01/17/18  Yes Esterwood, Amy S, PA-C  oxyCODONE (OXY IR/ROXICODONE) 5 MG immediate release tablet Take 5 mg by mouth 2 (two) times daily as needed for pain. 12/13/17  Yes [provider]  sevelamer carbonate (RENVELA) 800 MG tablet Take 4 tablets with meals  And 2 tablets twice daily with snacks 01/22/18  Yes Patrecia Pour, MD  traZODone (DESYREL) 150 MG tablet Take 150 mg by mouth at bedtime as needed for sleep.   Yes [provider]    Scheduled Meds: . Chlorhexidine Gluconate Cloth  6 each Topical Q0600  . ciprofloxacin  500 mg Oral QHS  . clopidogrel  75 mg Oral Daily  . gabapentin  100 mg Oral BID  . hydrALAZINE  100 mg Oral BID  . lactulose  20 g Oral BID  . sevelamer carbonate  800 mg Oral TID WC   Infusions: . sodium chloride    . sodium chloride    . diltiazem (CARDIZEM) infusion Stopped (01/23/18 2300)   PRN Meds: sodium chloride, sodium chloride, acetaminophen **OR** acetaminophen, albuterol, alteplase, bisacodyl, heparin, lidocaine (PF), lidocaine-prilocaine, ondansetron **OR** ondansetron (ZOFRAN) IV, oxyCODONE, pentafluoroprop-tetrafluoroeth, traZODone   Allergies as of 01/23/2018 - Review Complete 01/23/2018  Allergen Reaction Noted  . Morphine and related Other (See Comments) 12/23/2017  . Aspirin Other (See Comments) 02/27/2014  . Clonidine derivatives Itching 08/05/2013  . Tramadol Itching 02/25/2011  . Tylenol [acetaminophen] Nausea Only 10/27/2017    Family History  Problem Relation Age of Onset  . Heart disease Mother   . Lung cancer Mother   . Heart disease Father   . Malignant hyperthermia Father   . COPD Father   . Throat cancer Sister   . Esophageal cancer Sister   . Hypertension Other   . COPD Other   . Colon cancer Neg Hx   . Colon polyps Neg Hx   . Rectal cancer Neg Hx   . Stomach  cancer Neg Hx     Social History   Socioeconomic History  . Marital status: Single    Spouse name: Not on file  . Number of children: 3  . Years of education: 10  . Highest education level: Not on file  Occupational History  . Occupation: Unemployed  Social Needs  . Financial resource strain: Not on file  . Food insecurity:    Worry: Not on file    Inability: Not on file  . Transportation needs:    Medical: Not on file    Non-medical: Not on file  Tobacco Use  . Smoking status: Current Every Day Smoker    Packs/day: 0.50    Years: 43.00    Pack years: 21.50    Types: Cigarettes    Start date: 08/13/1973  . Smokeless tobacco: Never Used  Substance and Sexual Activity  . Alcohol use: Not Currently    Frequency: Never    Comment: quit drinking in 2017  . Drug use: Yes    Types: Marijuana    Comment: quit in 2017"  . Sexual activity: Not on file    Comment: "NOT LATELY" on cocaine  Lifestyle  . Physical activity:    Days per week: Not on file    Minutes per session: Not on file  . Stress: Not on file  Relationships  . Social connections:    Talks on phone: Not on file    Gets together: Not on file    Attends religious service: Not on file    Active member of club or organization: Not on file    Attends meetings of clubs or organizations: Not on file    Relationship status: Not on file  . Intimate partner violence:    Fear of current or ex partner: Not on file    Emotionally abused:  Not on file    Physically abused: Not on file    Forced sexual activity: Not on file  Other Topics Concern  . Not on file  Social History Narrative   Lives alone   Caffeine use: Coffee-rare   Soda- daily      [facility] (03/10/17):   Originally from [facility]. Previously worked trimming trees. No pets currently. No bird or mold exposure.     REVIEW OF SYSTEMS: Constitutional: Stable nonsevere weakness and fatigue. ENT:  No nose bleeds Pulm: Stable DOE.  No cough. CV:  No  palpitations, no rest pain.  If he stands on his feet for a long time they swell.  Patient .  Most recent AV graft placed in the right upper arm in late 11/2017, this was cleared for cannulation starting 12/19/2017 but will not allow dialysis providers to access the functional graft so he gets dialysis through [facility].  AV fistula in the left arm failed following complications.  He is fearful they are going to screw up the AV graft GU:  No hematuria, no frequency GI:  Per HPI Heme: Denies unusual bleeding or bruising. Transfusions:  Per HPI Neuro:  No headaches, no peripheral tingling or numbness Derm: Chronic pruritus which he says gets better if he takes his [place] Endocrine:  No sweats or chills.  No polyuria or dysuria Immunization:  Not queried.   Travel:  None beyond local counties in last few months.    PHYSICAL EXAM: Vital signs in last 24 hours: Vitals:   01/24/18 0450 01/24/18 0904  BP: (!) 163/66 112/64  Pulse:  77  Resp: (!) 22 20  Temp:  98.2 F (36.8 C)  SpO2: 95% 91%   Wt Readings from Last 3 Encounters:  01/24/18 149 lb 12.8 oz (67.9 kg)  01/21/18 150 lb 4.8 oz (68.2 kg)  01/17/18 157 lb 3.2 oz (71.3 kg)    General: Looks older than stated age.  Alert, pleasant, slightly anxious.  Looks chronically ill and slightly thin. Head: No facial asymmetry or swelling.  No head trauma. Eyes: No conjunctival pallor.  No scleral icterus.  EOMI. Ears: Not hard of hearing. Nose: No discharge or congestion Mouth: Tongue midline.  Oral mucosa pink, moist, clear.  Bulk of his teeth are gone, very few remain. Neck: No JVD, TMG, masses. Lungs: Clear bilaterally.  No labored breathing or cough. Heart: RRR.  No MRG.  S1, S2 present Abdomen: Slightly protuberant and tense.  Not tender.  Fullness in the right upper quadrant.  No organomegaly, masses, bruits..   Rectal: Deferred Musc/Skeltl: No joint redness, swelling or significant deformity. Extremities: Bulging AV fistulas in both  upper arms. Neurologic: Alert.  Oriented x3.  Moves all 4 limbs without tremor.  No gross deficits. Skin: Healed but darkly pigmented long rectangular scars in the left upper thigh consistent with previous skin graft harvesting. Nodes: No cervical or inguinal adenopathy. Psych:  Cooperative. Pleasant, somewhat anxious.    Intake/Output from previous day: 07/15 0701 - 07/16 0700 In: 89.9 [I.V.:59.4; IV Piggyback:30.5] Out: -  Intake/Output this shift: Total I/O In: 360 [P.O.:360] Out: -   LAB RESULTS: Recent Labs    01/23/18 0627  WBC 9.8  HGB 10.5*  HCT 31.1*  PLT 89*   BMET Lab Results  Component Value Date   NA 130 (L) 01/23/2018   NA 134 (L) 01/21/2018   NA 134 (L) 01/20/2018   K 6.0 (H) 01/23/2018   K 4.3 01/21/2018   K 3.7  01/20/2018   CL 91 (L) 01/23/2018   CL 91 (L) 01/21/2018   CL 93 (L) 01/20/2018   CO2 23 01/23/2018   CO2 29 01/21/2018   CO2 28 01/20/2018   GLUCOSE 93 01/23/2018   GLUCOSE 92 01/21/2018   GLUCOSE 134 (H) 01/20/2018   BUN 35 (H) 01/23/2018   BUN 17 01/21/2018   BUN 15 01/20/2018   CREATININE 9.15 (H) 01/23/2018   CREATININE 5.30 (H) 01/21/2018   CREATININE 5.09 (H) 01/20/2018   CALCIUM 9.1 01/23/2018   CALCIUM 8.8 (L) 01/21/2018   CALCIUM 8.6 (L) 01/20/2018   LFT Recent Labs    01/23/18 0627  PROT 6.9  ALBUMIN 3.9  AST 21  ALT 12  ALKPHOS 159*  BILITOT 1.1   PT/INR Lab Results  Component Value Date   INR 1.26 01/21/2018   INR 1.21 12/07/2017   INR 1.20 12/06/2017   Hepatitis Panel No results for input(s): HEPBSAG, HCVAB, HEPAIGM, HEPBIGM in the last 72 hours. C-Diff No components found for: CDIFF Lipase     Component Value Date/Time   LIPASE 29 01/23/2018 0627    Drugs of Abuse     Component Value Date/Time   LABOPIA NONE DETECTED 08/17/2016 0920   COCAINSCRNUR NONE DETECTED 08/17/2016 0920   LABBENZ NONE DETECTED 08/17/2016 0920   AMPHETMU NONE DETECTED 08/17/2016 0920   THCU NONE DETECTED 08/17/2016 0920    LABBARB NONE DETECTED 08/17/2016 0920     RADIOLOGY STUDIES: Dg Abd Acute W/chest  Result Date: 01/23/2018 CLINICAL DATA:  Abdominal pain and distention. EXAM: DG ABDOMEN ACUTE W/ 1V CHEST COMPARISON:  CT scan of the abdomen dated 01/02/2018 and chest x-ray dated 12/30/2017 FINDINGS: There are no dilated loops of bowel. Extensive ascites in the abdomen. Extensive arterial vascular calcifications throughout the abdomen. Chronic cardiomegaly. Double lumen central venous catheter in place with the tips in the right atrium, unchanged. Minimal blunting of the left costophrenic angle laterally. Small metallic device in the rectum, unchanged since the prior CT scan. IMPRESSION: Prominent ascites.  No significant change since the prior CT scan. Electronically Signed   By: Lorriane Shire M.D.   On: 01/23/2018 07:40   Ir Paracentesis  Result Date: 01/23/2018 INDICATION: Ascites secondary to alcoholic cirrhosis. Request of therapeutic paracentesis. EXAM: ULTRASOUND GUIDED therapeutic PARACENTESIS MEDICATIONS: 10 mL 2% lidocaine COMPLICATIONS: None immediate. PROCEDURE: Informed written consent was obtained from the patient after a discussion of the risks, benefits and alternatives to treatment. A timeout was performed prior to the initiation of the procedure. Initial ultrasound scanning demonstrates a moderate amount of ascites within the left lower abdominal quadrant. The left lower abdomen was prepped and draped in the usual sterile fashion. 2% lidocaine was used for local anesthesia. Following this, a 19 gauge, 7-cm, Yueh catheter was introduced. An ultrasound image was saved for documentation purposes. The paracentesis was performed. The catheter was removed and a dressing was applied. The patient tolerated the procedure well without immediate post procedural complication. FINDINGS: A total of approximately 1.6L of clear yellow fluid was removed. Samples were not sent to the laboratory as requested by the  clinical team. IMPRESSION: Successful ultrasound-guided paracentesis yielding 1.6 liters of peritoneal fluid. Read by Candiss Norse, PA-C Electronically Signed   By: Markus Daft M.D.   On: 01/23/2018 10:19     IMPRESSION:   *    Chronic abdominal pain.  Not sure that this is all due to ascites.  However pain improves following paracentesis.  *   Cirrhosis  with chronic ascites.  Unable to manage with diuretics due to nonfunctional kidneys in patient with ESRD.  Ascites is managed with frequent paracentesis.  Not a TIPS candidate.  *   Treated Hep C (virus not detectable 04/2017).  Chronic Hep B (Hep B core Ab and surface Ag +)  *    ESRD.  *    History HP and adenomatous colon polyps 2014.  Rectal ulcers with associated bleeding requiring flexible sigmoidoscopy with endoclipping.    *   History coronary DES stent 02/2017.  Was on Plavix after that (rec was at least 6 months) but patient stopped this several months if not > 1 year ago.   Received a single dose of Plavix 7/15, refused Plavix this morning   PLAN:     *  Stop Plavix.  Would ask cardiology to weigh in on need for Plavix.  It keeps being listed as an active med but he is not taking it at home for some time now.  Would be helpful if cardiology provided their rec re: Plavix.    *   Pt agreeable to colonoscopy and EGD, set for 11:30 tomorrow.  Dulcolax, Moviprep prep.     Azucena Freed  01/24/2018, 10:21 AM Phone [phone]

## 2018-01-24 NOTE — Progress Notes (Addendum)
NCM spoke with Manuela Schwartz at the IR scheduling for outpatient paracentesis.  She states that MD will need to put order in for   IR Paracentesis Diagnosis If patient will need albumin or not or any orther labs.  They can not schedule the outpatient paracentesis until the patient has been discharged.  They will contact the patient also.  NCM confirmed with patient that his home phone is [phone].

## 2018-01-24 NOTE — Care Management Note (Addendum)
Case Management Note  Patient Details  Name: [name] MRN: [id number] Date of Birth: 05/14/1961  Subjective/Objective:  From home, he will be set up with IR scheduling for outpatient paracentesis every 3 days, MD will put order in epic.  Scheduling can not schedule him until he has been discharged.  Patient s home phone number has been confirmed [phone].   7/18 Joshua Bamberger RN, BSN - NCM called IR scheduling  [phone] to make sure they will be calling patient for outpatient paracentesis.    Irine Seal states they will be calling patient to get this set up.                 Action/Plan: DC home when ready.   Expected Discharge Date:  01/24/18               Expected Discharge Plan:  Home/Self Care  In-House Referral:  Clinical Social Work  Discharge planning Services  CM Consult, Other - See comment  Post Acute Care Choice:    Choice offered to:  Patient  DME Arranged:    DME Agency:     HH Arranged:    [place] Agency:     Status of Service:  Completed, signed off  If discussed at [employer] of Stay Meetings, dates discussed:    Additional Comments:  Zenon Mayo, RN 01/24/2018, 9:24 AM

## 2018-01-25 ENCOUNTER — Encounter (HOSPITAL_COMMUNITY)
Admission: EM | Discharge status: Against Medical Advice | Payer: Self-pay | Source: Home / Self Care | Attending: Emergency Medicine

## 2018-01-25 ENCOUNTER — Other Ambulatory Visit: Payer: Self-pay

## 2018-01-25 ENCOUNTER — Observation Stay (HOSPITAL_COMMUNITY): Payer: Medicare Other | Admitting: Anesthesiology

## 2018-01-25 ENCOUNTER — Encounter (HOSPITAL_COMMUNITY): Payer: Self-pay | Admitting: *Deleted

## 2018-01-25 ENCOUNTER — Telehealth: Payer: Self-pay

## 2018-01-25 DIAGNOSIS — D122 Benign neoplasm of ascending colon: Secondary | ICD-10-CM | POA: Diagnosis not present

## 2018-01-25 DIAGNOSIS — D120 Benign neoplasm of cecum: Secondary | ICD-10-CM

## 2018-01-25 DIAGNOSIS — D631 Anemia in chronic kidney disease: Secondary | ICD-10-CM | POA: Diagnosis not present

## 2018-01-25 DIAGNOSIS — I132 Hypertensive heart and chronic kidney disease with heart failure and with stage 5 chronic kidney disease, or end stage renal disease: Secondary | ICD-10-CM | POA: Diagnosis not present

## 2018-01-25 DIAGNOSIS — K7031 Alcoholic cirrhosis of liver with ascites: Secondary | ICD-10-CM

## 2018-01-25 DIAGNOSIS — Z992 Dependence on renal dialysis: Secondary | ICD-10-CM

## 2018-01-25 DIAGNOSIS — K7030 Alcoholic cirrhosis of liver without ascites: Secondary | ICD-10-CM

## 2018-01-25 DIAGNOSIS — I480 Paroxysmal atrial fibrillation: Secondary | ICD-10-CM | POA: Diagnosis not present

## 2018-01-25 DIAGNOSIS — K319 Disease of stomach and duodenum, unspecified: Secondary | ICD-10-CM | POA: Diagnosis not present

## 2018-01-25 DIAGNOSIS — B181 Chronic viral hepatitis B without delta-agent: Secondary | ICD-10-CM | POA: Diagnosis not present

## 2018-01-25 DIAGNOSIS — D128 Benign neoplasm of rectum: Secondary | ICD-10-CM

## 2018-01-25 DIAGNOSIS — I4891 Unspecified atrial fibrillation: Secondary | ICD-10-CM

## 2018-01-25 DIAGNOSIS — N186 End stage renal disease: Secondary | ICD-10-CM

## 2018-01-25 DIAGNOSIS — K2970 Gastritis, unspecified, without bleeding: Secondary | ICD-10-CM

## 2018-01-25 DIAGNOSIS — R188 Other ascites: Secondary | ICD-10-CM

## 2018-01-25 DIAGNOSIS — D124 Benign neoplasm of descending colon: Secondary | ICD-10-CM

## 2018-01-25 DIAGNOSIS — R1084 Generalized abdominal pain: Secondary | ICD-10-CM | POA: Diagnosis not present

## 2018-01-25 DIAGNOSIS — R109 Unspecified abdominal pain: Secondary | ICD-10-CM | POA: Diagnosis not present

## 2018-01-25 DIAGNOSIS — K5140 Inflammatory polyps of colon without complications: Secondary | ICD-10-CM | POA: Diagnosis not present

## 2018-01-25 DIAGNOSIS — J42 Unspecified chronic bronchitis: Secondary | ICD-10-CM

## 2018-01-25 DIAGNOSIS — K621 Rectal polyp: Secondary | ICD-10-CM | POA: Diagnosis not present

## 2018-01-25 DIAGNOSIS — B182 Chronic viral hepatitis C: Secondary | ICD-10-CM

## 2018-01-25 DIAGNOSIS — E875 Hyperkalemia: Secondary | ICD-10-CM | POA: Diagnosis not present

## 2018-01-25 DIAGNOSIS — E8770 Fluid overload, unspecified: Secondary | ICD-10-CM | POA: Diagnosis not present

## 2018-01-25 DIAGNOSIS — K3189 Other diseases of stomach and duodenum: Secondary | ICD-10-CM | POA: Diagnosis not present

## 2018-01-25 DIAGNOSIS — G8929 Other chronic pain: Secondary | ICD-10-CM | POA: Diagnosis not present

## 2018-01-25 DIAGNOSIS — K648 Other hemorrhoids: Secondary | ICD-10-CM | POA: Diagnosis not present

## 2018-01-25 HISTORY — PX: BIOPSY: SHX5522

## 2018-01-25 HISTORY — PX: POLYPECTOMY: SHX5525

## 2018-01-25 HISTORY — PX: ESOPHAGOGASTRODUODENOSCOPY (EGD) WITH PROPOFOL: SHX5813

## 2018-01-25 HISTORY — PX: COLONOSCOPY WITH PROPOFOL: SHX5780

## 2018-01-25 LAB — POCT I-STAT 4, (NA,K, GLUC, HGB,HCT)
Glucose, Bld: 94 mg/dL (ref 70–99)
HCT: 36 % — ABNORMAL LOW (ref 39.0–52.0)
Hemoglobin: 12.2 g/dL — ABNORMAL LOW (ref 13.0–17.0)
Potassium: 4.7 mmol/L (ref 3.5–5.1)
Sodium: 133 mmol/L — ABNORMAL LOW (ref 135–145)

## 2018-01-25 LAB — HEPATITIS B SURFACE AG, CONFIRM: HBsAg Confirmation: POSITIVE — AB

## 2018-01-25 LAB — HEPATITIS B SURFACE ANTIGEN

## 2018-01-25 SURGERY — COLONOSCOPY WITH PROPOFOL
Anesthesia: Monitor Anesthesia Care

## 2018-01-25 MED ORDER — SODIUM CHLORIDE 0.9 % IV SOLN
INTRAVENOUS | Status: DC | PRN
  Administered 2018-01-25: 40 ug/min via INTRAVENOUS

## 2018-01-25 MED ORDER — PHENYLEPHRINE HCL 10 MG/ML IJ SOLN
INTRAMUSCULAR | Status: DC | PRN
  Administered 2018-01-25: 80 ug via INTRAVENOUS

## 2018-01-25 MED ORDER — PROPOFOL 500 MG/50ML IV EMUL
INTRAVENOUS | Status: DC | PRN
  Administered 2018-01-25: 50 ug/kg/min via INTRAVENOUS

## 2018-01-25 MED ORDER — SODIUM CHLORIDE 0.9 % IV SOLN
INTRAVENOUS | Status: DC
  Administered 2018-01-25: 12:00:00 via INTRAVENOUS

## 2018-01-25 MED ORDER — LIDOCAINE HCL (CARDIAC) PF 100 MG/5ML IV SOSY
PREFILLED_SYRINGE | INTRAVENOUS | Status: DC | PRN
  Administered 2018-01-25: 40 mg via INTRATRACHEAL

## 2018-01-25 MED ORDER — PANTOPRAZOLE SODIUM 40 MG PO TBEC: 40.0000 mg | DELAYED_RELEASE_TABLET | Freq: Every day | ORAL | Status: DC

## 2018-01-25 MED ORDER — ESMOLOL HCL 100 MG/10ML IV SOLN
INTRAVENOUS | Status: DC | PRN
  Administered 2018-01-25: 20 mg via INTRAVENOUS

## 2018-01-25 MED ORDER — CHLORHEXIDINE GLUCONATE CLOTH 2 % EX PADS
6.0000 | MEDICATED_PAD | Freq: Every day | CUTANEOUS | Status: DC
  Administered 2018-01-25: 6 via TOPICAL

## 2018-01-25 MED ORDER — PROPOFOL 10 MG/ML IV BOLUS
INTRAVENOUS | Status: DC | PRN
  Administered 2018-01-25: 100 mg via INTRAVENOUS
  Administered 2018-01-25: 50 mg via INTRAVENOUS

## 2018-01-25 SURGICAL SUPPLY — 25 items

## 2018-01-25 NOTE — Op Note (Signed)
[facility] Patient Name: [name] Procedure Date : 01/25/2018 MRN: [id number] Attending MD: Jerene Bears , MD Date of Birth: 1960/12/27 CSN: [id number] Age: 57 Admit Type: Inpatient Procedure:                Upper GI endoscopy Indications:              Generalized abdominal pain, Cirrhosis rule out                            esophageal varices Providers:                Lajuan Lines. Hilarie Fredrickson, MD, Kingsley Plan, RN, Charolette Child, Technician, Izora Gala, CRNA Referring MD:             [facility] Medicines:                Monitored Anesthesia Care Complications:            No immediate complications. Estimated Blood Loss:     Estimated blood loss was minimal. Procedure:                Pre-Anesthesia Assessment:                           - Prior to the procedure, a History and Physical                            was performed, and patient medications and                            allergies were reviewed. The patient's tolerance of                            previous anesthesia was also reviewed. The risks                            and benefits of the procedure and the sedation                            options and risks were discussed with the patient.                            All questions were answered, and informed consent                            was obtained. Prior Anticoagulants: The patient has                            taken Plavix (clopidogrel), last dose was 2 days                            prior to procedure. ASA Grade Assessment: III - A  patient with severe systemic disease. After                            reviewing the risks and benefits, the patient was                            deemed in satisfactory condition to undergo the                            procedure.                           After obtaining informed consent, the endoscope was                            passed under  direct vision. Throughout the                            procedure, the patient's blood pressure, pulse, and                            oxygen saturations were monitored continuously. The                            EG-[id number] ([id number]) scope was introduced through the                            mouth, and advanced to the second part of duodenum.                            The upper GI endoscopy was accomplished without                            difficulty. The patient tolerated the procedure                            well. Scope In: Scope Out: Findings:      The examined esophagus was normal.      There is no endoscopic evidence of varices in the entire esophagus.      Scattered mild inflammation characterized by erosions was found in the       gastric body, at the incisura and in the gastric antrum. Biopsies were       taken with a cold forceps for histology and Helicobacter pylori testing.      There is no endoscopic evidence of varices in the gastroesophageal       junction, in the cardia and in the gastric fundus.      The examined duodenum was normal. Impression:               - Normal esophagus.                           - Mild gastritis. Biopsied.                           - Normal examined  duodenum.                           - No evidence of gastroesophageal varices or portal                            gastropathy. Moderate Sedation:      N/A Recommendation:           - Return patient to hospital ward for ongoing care.                           - Low sodium diet.                           - Continue present medications.                           - Await pathology results.                           - Repeat upper endoscopy in 2 years for screening                            purposes. Procedure Code(s):        --- Professional ---                           [phone], Esophagogastroduodenoscopy, flexible,                            transoral; with biopsy, single or  multiple Diagnosis Code(s):        --- Professional ---                           K29.70, Gastritis, unspecified, without bleeding                           R10.84, Generalized abdominal pain                           K74.60, Unspecified cirrhosis of liver CPT copyright 2017 American Medical Association. All rights reserved. The codes documented in this report are preliminary and upon coder review may  be revised to meet current compliance requirements. Jerene Bears, MD 01/25/2018 12:41:00 PM This report has been signed electronically. Number of Addenda: 0

## 2018-01-25 NOTE — Anesthesia Preprocedure Evaluation (Signed)
Anesthesia Evaluation  Patient identified by MRN, date of birth, ID band Patient awake    Reviewed: Allergy & Precautions, NPO status , Patient's Chart, lab work & pertinent test results  Airway Mallampati: II  TM Distance: >3 FB Neck ROM: Full    Dental no notable dental hx.    Pulmonary neg pulmonary ROS, Current Smoker,    Pulmonary exam normal breath sounds clear to auscultation       Cardiovascular hypertension, + CAD, + Past MI, + Cardiac Stents and + Peripheral Vascular Disease  Normal cardiovascular exam+ dysrhythmias  Rhythm:Regular Rate:Normal     Neuro/Psych negative neurological ROS  negative psych ROS   GI/Hepatic PUD, GERD  ,(+)     substance abuse  , Hepatitis -  Endo/Other  negative endocrine ROS  Renal/GU DialysisRenal diseaseK 6.1 01/24/18 at 1500  negative genitourinary   Musculoskeletal negative musculoskeletal ROS (+)   Abdominal   Peds negative pediatric ROS (+)  Hematology negative hematology ROS (+)   Anesthesia Other Findings   Reproductive/Obstetrics negative OB ROS                             Anesthesia Physical Anesthesia Plan  ASA: IV  Anesthesia Plan: MAC   Post-op Pain Management:    Induction: Intravenous  PONV Risk Score and Plan:   Airway Management Planned: Simple Face Mask  Additional Equipment:   Intra-op Plan:   Post-operative Plan:   Informed Consent: I have reviewed the patients History and Physical, chart, labs and discussed the procedure including the risks, benefits and alternatives for the proposed anesthesia with the patient or authorized representative who has indicated his/her understanding and acceptance.   Dental advisory given  Plan Discussed with: CRNA and Surgeon  Anesthesia Plan Comments: (Awaiting istat potassium)        Anesthesia Quick Evaluation

## 2018-01-25 NOTE — Progress Notes (Signed)
See EGD/colon note. Pt okay for discharge.  Plavix restart per cardiology.  Patient was not using this as an outpatient and therefore it may be able to be stopped but would need cardiology input. Okay for discharge from GI perspective I will arrange intermittent large-volume paracentesis and outpatient consultation with interventional radiology to consider TIPS placement.  Recurrent abdominal pain due to ascites has been leading to multiple ER visits recently. Continue low-sodium diet

## 2018-01-25 NOTE — Plan of Care (Signed)
Discussed plan of care with patient.  Patient understands he must drink 100 grams of MoviPrep before his colonoscopy tomorrow morning.  Patient displayed good teach back.

## 2018-01-25 NOTE — Progress Notes (Signed)
Admit: 01/23/2018 LOS: 0  10M ESRD with abd pain, ascites, Afib/flutter  Subjective:  . For EGD and CSY today . HD yesterday, s/o early despite strong request to complete tx   07/16 0701 - 07/17 0700 In: 780 [P.O.:780] Out: 1165   Filed Weights   01/24/18 1415 01/24/18 1635 01/25/18 0646  Weight: 68.9 kg (151 lb 14.4 oz) 67.5 kg (148 lb 13 oz) 62 kg (136 lb 11 oz)    Scheduled Meds: . Chlorhexidine Gluconate Cloth  6 each Topical Q0600  . ciprofloxacin  500 mg Oral QHS  . gabapentin  100 mg Oral BID  . hydrALAZINE  100 mg Oral BID  . lactulose  20 g Oral BID  . sevelamer carbonate  800 mg Oral TID WC   Continuous Infusions: . diltiazem (CARDIZEM) infusion Stopped (01/23/18 2300)   PRN Meds:.acetaminophen **OR** acetaminophen, albuterol, bisacodyl, ondansetron **OR** ondansetron (ZOFRAN) IV, oxyCODONE, traZODone  Current Labs: reviewed    Physical Exam:  Blood pressure 129/70, pulse (!) 113, temperature 98.2 F (36.8 C), temperature source Oral, resp. rate (!) 23, height 5\' 9"  (1.753 m), weight 62 kg (136 lb 11 oz), SpO2 97 %. NAD Tachy, seems regular, nl s1s2 CtAB Mildly tender throughotu, No LEE RUE AVG +B/T  Outpt HD Orders Unit: [facility] Days: [facility] Time: 4h Dialyzer: F180 EDW: 67.5kg K/Ca: 2/2 Access: TDC, has AVG hasn't permitted its use yet Needle Size: n/a BFR/DFR: 400/800 UF Proflie: none VDRA: C3 0.72mcg qTx EPO: Mircera 191mcg q2wk, last given 7/3 IV Fe: none Heparin: none Treatment Adherence: poor, often s/o   A 1. ESRD MWF [place] 2. Afib/flutter, on diltiazem drip 3. Ascites, req recurrent LVP 4. Cirrhosis, HBV, HCV, hx/o ETOH 5. Hx/o PSA 6. CAD 7. Hx/o SBP  P . Should rec HD today post EGD and CSY if he permits; refusing currently; to get back on schedule . Refuses any cannulation of AVG . Medication Issues; o Preferred narcotic agents for pain control are hydromorphone, fentanyl, and methadone. Morphine should not be used.   o Baclofen should be avoided o Avoid oral sodium phosphate and magnesium citrate based laxatives / bowel preps    Pearson Grippe MD 01/25/2018, 10:01 AM  Recent Labs  Lab 01/21/18 0500 01/23/18 0627 01/24/18 1504  NA 134* 130* 131*  K 4.3 6.0* 6.1*  CL 91* 91* 90*  CO2 29 23 26   GLUCOSE 92 93 116*  BUN 17 35* 56*  CREATININE 5.30* 9.15* 10.63*  CALCIUM 8.8* 9.1 8.7*  PHOS  --   --  6.4*   Recent Labs  Lab 01/21/18 0500 01/23/18 0627 01/24/18 1504  WBC 6.6 9.8 8.5  HGB 9.8* 10.5* 10.4*  HCT 29.8* 31.1* 30.9*  MCV 80.8 80.2 80.3  PLT 68* 89* 132*

## 2018-01-25 NOTE — Telephone Encounter (Signed)
-----   Message from Jerene Bears, MD sent at 01/25/2018 12:49 PM EDT ----- EGD and colon done while hospitalized Needs referral to IR to consider TIPS May need periodic, scheduled LVP withOUT IV albumin. He is having recurrent ascites despite HD and this is causing pain. Would recommend LVP in 2 weeks and then every 3 weeks thereafter OV next available Thanks JMP

## 2018-01-25 NOTE — Telephone Encounter (Signed)
Pyrtle, Joshua Lines, [name]  Algernon Huxley, RN  Cc: Vena Rua, PA-C        EGD and colon done while hospitalized  Needs referral to IR to consider TIPS  May need periodic, scheduled LVP withOUT IV albumin.  He is having recurrent ascites despite HD and this is causing pain.  Would recommend LVP in 2 weeks and then every 3 weeks thereafter  OV next available  Thanks  JMP    Order placed in epic for IR eval and management for possible tips placement. Orders in epic for LV para, will call back tomorrow to schedule on 7/30 or 8/1 due to dialysis on MWF. Pt is still inpt so need to schedule tomorrow.

## 2018-01-25 NOTE — Anesthesia Procedure Notes (Signed)
Procedure Name: MAC Date/Time: 01/25/2018 11:46 AM Performed by: Izora Gala, CRNA Pre-anesthesia Checklist: Patient identified, Emergency Drugs available, Suction available and Patient being monitored Patient Re-evaluated:Patient Re-evaluated prior to induction Oxygen Delivery Method: Nasal cannula Preoxygenation: Pre-oxygenation with 100% oxygen Induction Type: IV induction Placement Confirmation: positive ETCO2

## 2018-01-25 NOTE — Care Management Obs Status (Signed)
[facility] NOTIFICATION   Patient Details  Name: [name] MRN: [id number] Date of Birth: Nov 15, 1960   Medicare Observation Status Notification Given:  Yes    Zenon Mayo, RN 01/25/2018, 8:33 AM

## 2018-01-25 NOTE — Op Note (Signed)
[facility] Patient Name: [name] Procedure Date : 01/25/2018 MRN: [id number] Attending MD: Jerene Bears , MD Date of Birth: [birth year] CSN: [id number] Age: 57 Admit Type: Inpatient Procedure:                Colonoscopy Indications:              Surveillance: Personal history of adenomatous                            polyps on last colonoscopy 5 years ago Providers:                Lajuan Lines. Hilarie Fredrickson, MD, Kingsley Plan, RN, Charolette Child, Technician, Izora Gala, CRNA Referring MD:             [facility] Medicines:                Monitored Anesthesia Care Complications:            No immediate complications. Estimated Blood Loss:     Estimated blood loss was minimal. Procedure:                Pre-Anesthesia Assessment:                           - Prior to the procedure, a History and Physical                            was performed, and patient medications and                            allergies were reviewed. The patient's tolerance of                            previous anesthesia was also reviewed. The risks                            and benefits of the procedure and the sedation                            options and risks were discussed with the patient.                            All questions were answered, and informed consent                            was obtained. Prior Anticoagulants: The patient has                            taken Plavix (clopidogrel), last dose was 2 days                            prior to procedure (patient received a single oral  dose of Plavix 75 mg on 01/23/18 and had not been on                            this medication recently prior to admission). ASA                            Grade Assessment: III - A patient with severe                            systemic disease. After reviewing the risks and                            benefits, the patient was deemed in  satisfactory                            condition to undergo the procedure.                           After obtaining informed consent, the colonoscope                            was passed under direct vision. Throughout the                            procedure, the patient's blood pressure, pulse, and                            oxygen saturations were monitored continuously. The                            EC-[id number] ([id number]) scope was introduced through                            the anus and advanced to the cecum, identified by                            appendiceal orifice and ileocecal valve. The                            colonoscopy was performed without difficulty. The                            patient tolerated the procedure well. The quality                            of the bowel preparation was excellent. The                            ileocecal valve, appendiceal orifice, and rectum                            were photographed. Scope In: 12:00:44 PM Scope Out: 12:29:14 PM Scope Withdrawal Time: 0 hours 25 minutes 22 seconds  Total Procedure  Duration: 0 hours 28 minutes 30 seconds  Findings:      The digital rectal exam was normal.      A 5 mm polyp was found in the cecum. The polyp was sessile. The polyp       was removed with a cold snare. Resection and retrieval were complete.      A 2 mm polyp was found in the cecum. The polyp was sessile. The polyp       was removed with a cold biopsy forceps. Resection and retrieval were       complete.      Two sessile polyps were found in the ascending colon. The polyps were 2       to 3 mm in size. These polyps were removed with a cold biopsy forceps.       Resection and retrieval were complete.      Five sessile polyps were found in the hepatic flexure and ascending       colon. The polyps were 4 to 6 mm in size. These polyps were removed with       a cold snare. Resection and retrieval were complete.      A 6 mm polyp was found  in the descending colon. The polyp was sessile.       The polyp was removed with a cold snare. Resection and retrieval were       complete.      A previously placed endoscopic clip was found in the rectum. There was       polypoid tissue under the clip (likely granulation tissue). Biopsy was       taken with a cold forceps for histology to exclude adenoma. The clip       appeared to be deeply seated in the submucosa and therefore was not       removed today.      Internal hemorrhoids were found during retroflexion. The hemorrhoids       were small. Impression:               - One 5 mm polyp in the cecum, removed with a cold                            snare. Resected and retrieved.                           - One 2 mm polyp in the cecum, removed with a cold                            biopsy forceps. Resected and retrieved.                           - Two 2 to 3 mm polyps in the ascending colon,                            removed with a cold biopsy forceps. Resected and                            retrieved.                           - Five  4 to 6 mm polyps at the hepatic flexure and                            in the ascending colon, removed with a cold snare.                            Resected and retrieved.                           - One 6 mm polyp in the descending colon, removed                            with a cold snare. Resected and retrieved.                           - Previously placed endoscopic clip in the rectum.                            Biopsy of surrounding tissue.                           - Small internal hemorrhoids. Moderate Sedation:      N/A Recommendation:           - Patient has a contact number available for                            emergencies. The signs and symptoms of potential                            delayed complications were discussed with the                            patient. Return to normal activities tomorrow.                             Written discharge instructions were provided to the                            patient.                           - Resume previous diet.                           - Continue present medications.                           - Await pathology results.                           - Repeat colonoscopy is recommended for                            surveillance. The colonoscopy date will be  determined after pathology results from today's                            exam become available for review. Procedure Code(s):        --- Professional ---                           [phone], Colonoscopy, flexible; with removal of                            tumor(s), polyp(s), or other lesion(s) by snare                            technique                           45380, 42, Colonoscopy, flexible; with biopsy,                            single or multiple Diagnosis Code(s):        --- Professional ---                           Z86.010, Personal history of colonic polyps                           D12.0, Benign neoplasm of cecum                           D12.4, Benign neoplasm of descending colon                           D12.3, Benign neoplasm of transverse colon (hepatic                            flexure or splenic flexure)                           D12.2, Benign neoplasm of ascending colon                           T18.5XXA, Foreign body in anus and rectum, initial                            encounter                           K64.8, Other hemorrhoids CPT copyright 2017 American Medical Association. All rights reserved. The codes documented in this report are preliminary and upon coder review may  be revised to meet current compliance requirements. Jerene Bears, MD 01/25/2018 12:49:39 PM This report has been signed electronically. Number of Addenda: 0

## 2018-01-25 NOTE — Progress Notes (Signed)
Late Note: Patient returned from colonoscopy/EGD at 1300 stating he would not go for another dialysis treatment today. He stated he wanted to go home and wanted to be discharged. Called Dr. Nevada Crane, who declined to discharge patient without hemodialysis treatment. Patient was counseled and continued to refuse further treatment. He signed himself out [place] shortly after 1400.

## 2018-01-25 NOTE — Transfer of Care (Signed)
Immediate Anesthesia Transfer of Care Note  Patient: [name]  Procedure(s) Performed: COLONOSCOPY WITH PROPOFOL (N/A ) ESOPHAGOGASTRODUODENOSCOPY (EGD) WITH PROPOFOL (N/A ) BIOPSY POLYPECTOMY  Patient Location: PACU and Endoscopy Unit  Anesthesia Type:MAC  Level of Consciousness: awake, alert , oriented and patient cooperative  Airway & Oxygen Therapy: Patient Spontanous Breathing and Patient connected to face mask oxygen  Post-op Assessment: Report given to RN, Post -op Vital signs reviewed and stable and Patient moving all extremities  Post vital signs: Reviewed and stable  Last Vitals:  Vitals Value Taken Time  BP 124/80 01/25/2018 12:34 PM  Temp    Pulse 114 01/25/2018 12:35 PM  Resp 19 01/25/2018 12:35 PM  SpO2 100 % 01/25/2018 12:35 PM  Vitals shown include unvalidated device data.  Last Pain:  Vitals:   01/25/18 1234  TempSrc: Oral  PainSc: 0-No pain      Patients Stated Pain Goal: 3 (47/82/95 6213)  Complications: No apparent anesthesia complications

## 2018-01-25 NOTE — Progress Notes (Addendum)
Daily Rounding Note  01/25/2018, 9:15 AM  LOS: 0 days   SUBJECTIVE:     Pt completed first liter prep and 1/2 of second liter but stopped as he had enough of all the BM's.  He says stools are clear, all RN can confirm is multiple loose stools overnight so adequate prep not staff confirmed At my request he will try to finish final 1/2 of prep.   No abd pain or nausea.    OBJECTIVE:         Vital signs in last 24 hours:    Temp:  [97.3 F (36.3 C)-98.2 F (36.8 C)] 98.2 F (36.8 C) (07/17 0817) Pulse Rate:  [84-116] 113 (07/17 0817) Resp:  [12-23] 23 (07/17 0817) BP: (100-136)/(57-96) 129/70 (07/17 0817) SpO2:  [95 %-98 %] 97 % (07/17 0817) Weight:  [136 lb 11 oz (62 kg)-151 lb 14.4 oz (68.9 kg)] 136 lb 11 oz (62 kg) (07/17 0646) Last BM Date: 01/24/18 Filed Weights   01/24/18 1415 01/24/18 1635 01/25/18 0646  Weight: 151 lb 14.4 oz (68.9 kg) 148 lb 13 oz (67.5 kg) 136 lb 11 oz (62 kg)   General: looks frail but alert, comfortable   Heart: RRR Chest: no labored breathing Abdomen: protuberant Extremities: no CCE Neuro/Psych:  Appropriate, alert, no gross deficits.    Intake/Output from previous day: 07/16 0701 - 07/17 0700 In: 780 [P.O.:780] Out: 1165   Intake/Output this shift: No intake/output data recorded.  Lab Results: Recent Labs    01/23/18 0627 01/24/18 1504  WBC 9.8 8.5  HGB 10.5* 10.4*  HCT 31.1* 30.9*  PLT 89* 132*   BMET Recent Labs    01/23/18 0627 01/24/18 1504  NA 130* 131*  K 6.0* 6.1*  CL 91* 90*  CO2 23 26  GLUCOSE 93 116*  BUN 35* 56*  CREATININE 9.15* 10.63*  CALCIUM 9.1 8.7*   LFT Recent Labs    01/23/18 0627 01/24/18 1504  PROT 6.9  --   ALBUMIN 3.9 3.8  AST 21  --   ALT 12  --   ALKPHOS 159*  --   BILITOT 1.1  --    PT/INR No results for input(s): LABPROT, INR in the last 72 hours. Hepatitis Panel Recent Labs    01/24/18 1431  HEPBSAG Confirm. indicated      Studies/Results: Ir Paracentesis  Result Date: 01/23/2018 INDICATION: Ascites secondary to alcoholic cirrhosis. Request of therapeutic paracentesis. EXAM: ULTRASOUND GUIDED therapeutic PARACENTESIS MEDICATIONS: 10 mL 2% lidocaine COMPLICATIONS: None immediate. PROCEDURE: Informed written consent was obtained from the patient after a discussion of the risks, benefits and alternatives to treatment. A timeout was performed prior to the initiation of the procedure. Initial ultrasound scanning demonstrates a moderate amount of ascites within the left lower abdominal quadrant. The left lower abdomen was prepped and draped in the usual sterile fashion. 2% lidocaine was used for local anesthesia. Following this, a 19 gauge, 7-cm, Yueh catheter was introduced. An ultrasound image was saved for documentation purposes. The paracentesis was performed. The catheter was removed and a dressing was applied. The patient tolerated the procedure well without immediate post procedural complication. FINDINGS: A total of approximately 1.6L of clear yellow fluid was removed. Samples were not sent to the laboratory as requested by the clinical team. IMPRESSION: Successful ultrasound-guided paracentesis yielding 1.6 liters of peritoneal fluid. Read by Candiss Norse, PA-C Electronically Signed   By: Markus Daft M.D.   On: 01/23/2018 10:19  Scheduled Meds: . Chlorhexidine Gluconate Cloth  6 each Topical Q0600  . ciprofloxacin  500 mg Oral QHS  . gabapentin  100 mg Oral BID  . hydrALAZINE  100 mg Oral BID  . lactulose  20 g Oral BID  . sevelamer carbonate  800 mg Oral TID WC   Continuous Infusions: . diltiazem (CARDIZEM) infusion Stopped (01/23/18 2300)   PRN Meds:.acetaminophen **OR** acetaminophen, albuterol, bisacodyl, ondansetron **OR** ondansetron (ZOFRAN) IV, oxyCODONE, traZODone   ASSESMENT:   *    Chronic abdominal pain.  Not sure that this is all due to ascites.  However pain improves following  paracentesis.  *   Cirrhosis with chronic ascites.  Hx SBP, on daily prophylactic Cipro.  Managed with frequent paracentesis,  no role for diuretics due to nonfunctional kidneys in patient with ESRD.  Not a TIPS candidate.  *   Treated Hep C.  Chronic Hep B.  *  Hx HE, on lactulose chronically.    *    ESRD.  Normally HD on MWF but missed Monday session, in pt HD Tuesday  *   Chronic anemia.  Overall improved and stable.    *    History HP and adenomatous colon polyps 2014.  Rectal ulcers with associated bleeding requiring flexible sigmoidoscopy with endoclipping.    *   Hyponatremia.     PLAN   *  EGD, Colonoscopy today.  "back up" tap water enema placed, d/w RN to be administered if stools not clear.      Azucena Freed  01/25/2018, 9:15 AM Phone [phone]

## 2018-01-25 NOTE — Anesthesia Postprocedure Evaluation (Signed)
Anesthesia Post Note  Patient: [name]  Procedure(s) Performed: COLONOSCOPY WITH PROPOFOL (N/A ) ESOPHAGOGASTRODUODENOSCOPY (EGD) WITH PROPOFOL (N/A ) BIOPSY POLYPECTOMY     Patient location during evaluation: PACU Anesthesia Type: MAC Level of consciousness: awake and alert Pain management: pain level controlled Vital Signs Assessment: post-procedure vital signs reviewed and stable Respiratory status: spontaneous breathing, nonlabored ventilation, respiratory function stable and patient connected to nasal cannula oxygen Cardiovascular status: stable and blood pressure returned to baseline Postop Assessment: no apparent nausea or vomiting Anesthetic complications: no    Last Vitals:  Vitals:   01/25/18 1051 01/25/18 1234  BP: 134/71 124/80  Pulse: (!) 106   Resp: 14 18  Temp: 36.8 C 36.6 C  SpO2: 99% 99%    Last Pain:  Vitals:   01/25/18 1234  TempSrc: Oral  PainSc: 0-No pain                 Soraya Paquette S

## 2018-01-25 NOTE — Progress Notes (Signed)
PROGRESS NOTE  [name] GYJ:[id number] DOB: 1961/06/20 DOA: 01/23/2018 PCP: [name], MD  HPI/Recap of past 24 hours: 57 y.o.malewith a past medical history significant for EtOH abuse, alcoholic in bilateral liver cirrhosis with ascites, hypertension, COPD not on home oxygen, GERD, anemia of chronic disease, end-stage kidney disease on dialysis Monday Wednesday Friday, polysubstance abuse, hepatitis B, hepatitis C, CAD presents with abdominal pain. Initial evaluation reveals ascites and creatinine of 9 and potassium 6. [facility] are asked to admit.  Discharged 2 days prior to readmission for ascites and A. fib/flutter with RVR.  Post paracentesis on January 21, 2018.  01/28/2018: Patient seen and examined this visit.  He reports abdominal pain is improved.  Has right shoulder pain next to his IV access.  He is scheduled for EGD and sigmoidoscopy this morning.  Assessment/Plan: Principal Problem:   Abdominal pain Active Problems:   Chronic hepatitis B (HCC)   Chronic hepatitis C without hepatic coma (HCC)   Thrombocytopenia (HCC)   Essential hypertension   Anemia   Ascites   COPD (chronic obstructive pulmonary disease) (HCC)   ESRD (end stage renal disease) (HCC)   Atrial fibrillation (HCC)  Chronic abdominal pain, suspect multifactorial Moderate ascites present Recent paracentesis on 01/21/2018 GI following  Decompensated liver alcoholic/hepatitis B cirrhosis with ascites Treated hepatitis C History of hepatic encephalopathy on lactulose Last paracentesis was on July 13 History of SBP-continue prophylactic ciprofloxacin EGD scheduled today to assess/treat esophageal varices Sigmoidoscopy also scheduled this morning Will have a repeat paracentesis as outpatient by IR upon discharge Continue Cipro Continue lactulose  Chronic diastolic CHF Last 2D echo 08/30/17 revealed EF 55 to 60% with hypokinesis anterolateral and inferior septal  myocardium  Hypervolemic hyponatremia, improving Asymptomatic-denies headache or dizziness Sodium 133 from 131 Nephrology following will be dialyzed prior to discharge  Hyperkalemia, resolved Potassium 6.1 On dialysis  End-stage renal disease on dialysis MWF Dialysis plan today 01/28/2018 post EGD and sigmoidoscopy  Right shoulder pain Suspect musculoskeletal Avoid morphine and baclofen Use hydromorphone, fentanyl as needed Avoid sodium phosphate and magnesium citrate based laxatives  Anemia of chronic disease Secondary to end-stage renal disease Hemoglobin stable No sign of overt bleeding this morning  Alcohol abuse Alcohol cessation counseling done at bedside  Chronic thrombocytopenia Platelets stable at 132K No sign of overt bleeding Repeat CBC in the morning     Code Status: Full code  Family Communication: None at bedside  Disposition Plan: Home in 1 to 2 days when nephrology and GI sign of   Consultants: GI Nephrology  Procedures:  EGD, sigmoidoscopy plan today  Antimicrobials:  P.o. ciprofloxacin prophylactically for history of SBP  DVT prophylaxis: SCDs due to planned procedure   Objective: Vitals:   01/25/18 0353 01/25/18 0646 01/25/18 0817 01/25/18 1051  BP: (!) 133/96  129/70 134/71  Pulse: (!) 116  (!) 113 (!) 106  Resp: 16  (!) 23 14  Temp: 97.8 F (36.6 C)  98.2 F (36.8 C) 98.2 F (36.8 C)  TempSrc: Oral  Oral Oral  SpO2: 95%  97% 99%  Weight:  62 kg (136 lb 11 oz)    Height:        Intake/Output Summary (Last 24 hours) at 01/25/2018 1123 Last data filed at 01/25/2018 0959 Gross per 24 hour  Intake 420 ml  Output 1165 ml  Net -745 ml   Filed Weights   01/24/18 1415 01/24/18 1635 01/25/18 0646  Weight: 68.9 kg (151 lb 14.4 oz) 67.5 kg (148 lb  13 oz) 62 kg (136 lb 11 oz)    Exam:  . General: 57 y.o. year-old male well developed well nourished in no acute distress.  Alert and oriented x3. . Cardiovascular: Regular rate  and rhythm with no rubs or gallops.  No thyromegaly or JVD noted.   Marland Kitchen Respiratory: Clear to auscultation with no wheezes or rales. Good inspiratory effort. . Abdomen:  Moderately distended with hypoactive bowel sounds.  Nontender on palpation. . Musculoskeletal: No lower extremity edema. 2/4 pulses in all 4 extremities. . Skin: No ulcerative lesions noted or rashes, . Psychiatry: Mood is appropriate for condition and setting   Data Reviewed: CBC: Recent Labs  Lab 01/20/18 2214 01/21/18 0500 01/23/18 0627 01/24/18 1504 01/25/18 1047  WBC 8.7 6.6 9.8 8.5  --   HGB 10.2* 9.8* 10.5* 10.4* 12.2*  HCT 30.4* 29.8* 31.1* 30.9* 36.0*  MCV 80.6 80.8 80.2 80.3  --   PLT 98* 68* 89* 132*  --    Basic Metabolic Panel: Recent Labs  Lab 01/20/18 2214 01/21/18 0500 01/23/18 0627 01/24/18 1504 01/25/18 1047  NA 134* 134* 130* 131* 133*  K 3.7 4.3 6.0* 6.1* 4.7  CL 93* 91* 91* 90*  --   CO2 28 29 23 26   --   GLUCOSE 134* 92 93 116* 94  BUN 15 17 35* 56*  --   CREATININE 5.09* 5.30* 9.15* 10.63*  --   CALCIUM 8.6* 8.8* 9.1 8.7*  --   PHOS  --   --   --  6.4*  --    GFR: Estimated Creatinine Clearance: 6.9 mL/min (A) (by C-G formula based on SCr of 10.63 mg/dL (H)). Liver Function Tests: Recent Labs  Lab 01/20/18 2214 01/23/18 0627 01/24/18 1504  AST 24 21  --   ALT 11 12  --   ALKPHOS 189* 159*  --   BILITOT 0.8 1.1  --   PROT 7.1 6.9  --   ALBUMIN 3.8 3.9 3.8   Recent Labs  Lab 01/20/18 2214 01/23/18 0627  LIPASE 43 29   No results for input(s): AMMONIA in the last 168 hours. Coagulation Profile: Recent Labs  Lab 01/21/18 0249  INR 1.26   Cardiac Enzymes: No results for input(s): CKTOTAL, CKMB, CKMBINDEX, TROPONINI in the last 168 hours. BNP (last 3 results) No results for input(s): PROBNP in the last 8760 hours. HbA1C: No results for input(s): HGBA1C in the last 72 hours. CBG: No results for input(s): GLUCAP in the last 168 hours. Lipid Profile: No results  for input(s): CHOL, HDL, LDLCALC, TRIG, CHOLHDL, LDLDIRECT in the last 72 hours. Thyroid Function Tests: No results for input(s): TSH, T4TOTAL, FREET4, T3FREE, THYROIDAB in the last 72 hours. Anemia Panel: No results for input(s): VITAMINB12, FOLATE, FERRITIN, TIBC, IRON, RETICCTPCT in the last 72 hours. Urine analysis:    Component Value Date/Time   COLORURINE YELLOW 07/16/2011 1619   APPEARANCEUR CLEAR 07/16/2011 1619   LABSPEC 1.018 07/16/2011 1619   PHURINE 7.5 07/16/2011 1619   GLUCOSEU 250 (A) 07/16/2011 1619   HGBUR MODERATE (A) 07/16/2011 1619   BILIRUBINUR SMALL (A) 07/16/2011 1619   KETONESUR NEGATIVE 07/16/2011 1619   PROTEINUR >300 (A) 07/16/2011 1619   UROBILINOGEN 1.0 07/16/2011 1619   NITRITE NEGATIVE 07/16/2011 1619   LEUKOCYTESUR SMALL (A) 07/16/2011 1619   Sepsis Labs: @LABRCNTIP (procalcitonin:4,lacticidven:4)  ) Recent Results (from the past 240 hour(s))  MRSA PCR Screening     Status: None   Collection Time: 01/21/18  7:40 AM  Result  Value Ref Range Status   MRSA by PCR NEGATIVE NEGATIVE Final    Comment:        The GeneXpert MRSA Assay (FDA approved for NASAL specimens only), is one component of a comprehensive MRSA colonization surveillance program. It is not intended to diagnose MRSA infection nor to guide or monitor treatment for MRSA infections. Performed at [facility], [place] [address], [place], [place] [address]   Culture, blood (Routine X 2) w Reflex to ID Panel     Status: None (Preliminary result)   Collection Time: 01/21/18  8:14 AM  Result Value Ref Range Status   Specimen Description BLOOD LEFT HAND  Final   Special Requests   Final    BOTTLES DRAWN AEROBIC ONLY Blood Culture adequate volume   Culture   Final    NO GROWTH 3 DAYS Performed at [facility], [place] [address], [place], [place] [address]    Report Status PENDING  Incomplete  Culture, blood (Routine X 2) w Reflex to ID Panel     Status: None (Preliminary  result)   Collection Time: 01/21/18  8:16 AM  Result Value Ref Range Status   Specimen Description BLOOD LEFT HAND  Final   Special Requests   Final    BOTTLES DRAWN AEROBIC ONLY Blood Culture results may not be optimal due to an inadequate volume of blood received in culture bottles   Culture   Final    NO GROWTH 3 DAYS Performed at [facility], [place] [address] [place], [place] [address]    Report Status PENDING  Incomplete  Culture, body fluid-bottle     Status: None (Preliminary result)   Collection Time: 01/21/18 12:05 PM  Result Value Ref Range Status   Specimen Description PERITONEAL  Final   Special Requests NONE  Final   Culture   Final    NO GROWTH 3 DAYS Performed at [facility], [address] [place], [place] [address]    Report Status PENDING  Incomplete  Gram stain     Status: None   Collection Time: 01/21/18 12:05 PM  Result Value Ref Range Status   Specimen Description PERITONEAL  Final   Special Requests NONE  Final   Gram Stain   Final    RARE WBC PRESENT, PREDOMINANTLY MONONUCLEAR NO ORGANISMS SEEN Performed at [facility], [address], [place],  [address]    Report Status 01/21/2018 FINAL  Final      Studies: No results found.  Scheduled Meds: . [MAR Hold] Chlorhexidine Gluconate Cloth  6 each Topical Q0600  . [MAR Hold] Chlorhexidine Gluconate Cloth  6 each Topical Q0600  . [MAR Hold] ciprofloxacin  500 mg Oral QHS  . [MAR Hold] gabapentin  100 mg Oral BID  . [MAR Hold] hydrALAZINE  100 mg Oral BID  . [MAR Hold] lactulose  20 g Oral BID  . [MAR Hold] sevelamer carbonate  800 mg Oral TID WC    Continuous Infusions: . sodium chloride    . [MAR Hold] diltiazem (CARDIZEM) infusion Stopped (01/23/18 2300)     LOS: 0 days     Kayleen Memos, MD [facility] Pager [phone]  If 7PM-7AM, please contact night-coverage [email or web address] Password [facility] 01/25/2018, 11:23 AM

## 2018-01-26 ENCOUNTER — Encounter: Payer: Self-pay | Admitting: Internal Medicine

## 2018-01-26 LAB — CULTURE, BLOOD (ROUTINE X 2)
Culture: NO GROWTH
Culture: NO GROWTH
Special Requests: ADEQUATE

## 2018-01-26 LAB — CULTURE, BODY FLUID W GRAM STAIN -BOTTLE: Culture: NO GROWTH

## 2018-01-26 NOTE — Telephone Encounter (Signed)
I do not know who ordered these Given recent LVP, the next on 02/07/18 should be sufficient

## 2018-01-26 NOTE — Telephone Encounter (Signed)
Para scheduled for 01/31/18 cancelled. Pt knows to keep  02/07/18 appt.

## 2018-01-26 NOTE — Discharge Summary (Signed)
Discharge Summary  [name] LPF:[id number] DOB: 09/22/1960  PCP: [name], MD  Admit date: 01/23/2018 Discharge date: 01/26/2018  Time spent: 15 minutes   Recommendations for Outpatient Follow-up:   PATIENT LEFT AGAINST MEDICAL ADVICE ON 01/25/18.  Discharge Diagnoses:  Active Hospital Problems   Diagnosis Date Noted  . Abdominal pain 10/28/2017  . Benign neoplasm of cecum   . Benign neoplasm of ascending colon   . Benign neoplasm of descending colon   . Benign neoplasm of rectum   . Atrial fibrillation ([place]) 01/23/2018  . ESRD (end stage renal disease) ([place]) 11/21/2017  . Anemia 04/23/2017  . Ascites 04/23/2017  . COPD (chronic obstructive pulmonary disease) ([place]) 04/23/2017  . Essential hypertension 09/27/2016  . Chronic hepatitis B ([place]) 03/05/2014  . Chronic hepatitis C without hepatic coma ([place]) 03/05/2014  . Thrombocytopenia ([place]) 03/05/2014    Resolved Hospital Problems  No resolved problems to display.    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 Initial evaluation reveals ascites and creatinine of 9 and potassium 6. [facility] asked to admit.  Discharged 2 days prior to readmission for ascites and A. fib/flutter with RVR.  Post paracentesis on January 21, 2018.  01/25/2018: EGD and colonoscopy completed.  Declined HD planned today and left against medical advice.   Hospital Course:  Principal Problem:   Abdominal pain Active Problems:   Chronic hepatitis B  (HCC)   Chronic hepatitis C without hepatic coma (HCC)   Thrombocytopenia (HCC)   Essential hypertension   Anemia   Ascites   COPD (chronic obstructive pulmonary disease) (HCC)   ESRD (end stage renal disease) (HCC)   Atrial fibrillation (HCC)   Benign neoplasm of cecum   Benign neoplasm of ascending colon   Benign neoplasm of descending colon   Benign neoplasm of rectum  Chronic abdominal pain, suspect multifactorial Moderate ascites present Recent paracentesis on 01/21/2018 GI following  Decompensated liver alcoholic/hepatitis B cirrhosis with ascites Treated hepatitis C History of hepatic encephalopathy on lactulose Last paracentesis was on July 13 History of SBP-continue prophylactic ciprofloxacin EGD scheduled today to assess/treat esophageal varices Sigmoidoscopy also scheduled this morning Will have a repeat paracentesis as outpatient by IR upon discharge Continue Cipro Continue lactulose  Chronic diastolic CHF Last 2D echo 08/30/17 revealed EF 55 to 60% with hypokinesis anterolateral and inferior septal myocardium  Hypervolemic hyponatremia, improving Asymptomatic-denies headache or dizziness Sodium 133 from 131 Nephrology following will be dialyzed prior to discharge  Hyperkalemia, resolved Potassium 6.1 On dialysis  End-stage renal disease on dialysis MWF Dialysis plan today 01/28/2018 post EGD and sigmoidoscopy  Right shoulder pain Suspect musculoskeletal Avoid morphine and baclofen Use hydromorphone, fentanyl as needed Avoid sodium phosphate and magnesium citrate based laxatives  Anemia of chronic disease Secondary to end-stage renal disease Hemoglobin stable No sign of overt bleeding this morning  Alcohol abuse Alcohol cessation counseling done at bedside  Chronic thrombocytopenia Platelets stable at 132K No sign of overt bleeding Repeat CBC in the  morning   Procedures:  EGD  Colonoscopy  Consultations:  GI  Nephrology  Discharge Exam: BP 124/80   Pulse (!) 106   Temp 97.8 F (36.6 C) (Oral)   Resp 18   Ht 5\' 9"  (1.753 m)   Wt 62 kg (136 lb 11 oz)   SpO2 99%   BMI 20.18 kg/m  . General: 57 y.o. year-old male well developed well nourished in no acute distress.  Alert and oriented x3. . Cardiovascular: Regular rate and rhythm with no rubs or gallops.  No thyromegaly or JVD noted.   Marland Kitchen Respiratory: Clear to auscultation with no wheezes or rales. Good inspiratory effort. . Abdomen: Soft nontender nondistended with normal bowel sounds x4 quadrants. . Musculoskeletal: No lower extremity edema. 2/4 pulses in all 4 extremities. Marland Kitchen Psychiatry: Mood is anxious.  Discharge Instructions You were cared for by a hospitalist during your hospital stay. If you have any questions about your discharge medications or the care you received while you were in the hospital after you are discharged, you can call the unit and asked to speak with the hospitalist on call if the hospitalist that took care of you is not available. Once you are discharged, your primary care physician will handle any further medical issues. Please note that NO REFILLS for any discharge medications will be authorized once you are discharged, as it is imperative that you return to your primary care physician (or establish a relationship with a primary care physician if you do not have one) for your aftercare needs so that they can reassess your need for medications and monitor your lab values.   Allergies as of 01/25/2018      Reactions   Morphine And Related Other (See Comments)   Stomach pain   Aspirin Other (See Comments)   STOMACH PAIN   Clonidine Derivatives Itching   Tramadol Itching   Tylenol [acetaminophen] Nausea Only   Stomach ache      Medication List    TAKE these medications   ciprofloxacin 500 MG tablet Commonly known as:  CIPRO Take 500 mg by  mouth at bedtime.   clopidogrel 75 MG tablet Commonly known as:  PLAVIX Take 75 mg by mouth daily.   diltiazem 120 MG 24 hr capsule Commonly known as:  CARDIZEM CD Take 1 capsule (120 mg total) by mouth daily.   gabapentin 100 MG capsule Commonly known as:  NEURONTIN Take 100 mg by mouth 2 (two) times daily.   hydrALAZINE 100 MG tablet Commonly known as:  APRESOLINE Take 100 mg by mouth 2 (two) times daily. Taking after HD on Dialysis days   lactulose (encephalopathy) 10 GM/15ML Soln Commonly known as:  CHRONULAC Take 15 ml's by mouth twice daily.   oxyCODONE 5 MG immediate release tablet Commonly known as:  Oxy IR/ROXICODONE Take 5 mg by mouth 2 (two) times daily as needed for pain.   sevelamer carbonate 800 MG tablet Commonly known as:  RENVELA Take 4 tablets with meals  And 2 tablets twice daily with snacks   traZODone 150 MG tablet Commonly known as:  DESYREL Take 150 mg by mouth at bedtime as needed for sleep.   VENTOLIN HFA 108 (90 Base) MCG/ACT inhaler Generic drug:  albuterol Inhale 2 puffs into the lungs every 6 (six) hours as needed for wheezing or shortness of breath.      Allergies  Allergen Reactions  . Morphine And Related Other (See Comments)    Stomach pain  . Aspirin Other (See Comments)    STOMACH PAIN  .  Clonidine Derivatives Itching  . Tramadol Itching  . Tylenol [Acetaminophen] Nausea Only    Stomach ache   Follow-up Information    Schedule Paracentesis. Call.   Why:  Please call when you know you are leaving the hospital to schedule your outpt appoinments. Contact information: [phone]           The results of significant diagnostics from this hospitalization (including imaging, microbiology, ancillary and laboratory) are listed below for reference.    Significant Diagnostic Studies: Dg Chest 2 View  Result Date: 12/30/2017 CLINICAL DATA:  57 year old male with increasing shortness of breath for 2 days. Dialysis today.  Wheezing. EXAM: CHEST - 2 VIEW COMPARISON:  Chest and abdominal series 12/29/2017 and earlier. FINDINGS: Stable right chest dual lumen dialysis catheter. Stable cardiomegaly and mediastinal contours. Stable lung volumes. Pulmonary vascularity appears stable without pulmonary edema. No pneumothorax. No pleural effusion. There is streaky increased lower lobe opacity on the lateral view. No consolidation. Visualized tracheal air column is within normal limits. Negative visible bowel gas pattern. Bony sclerosis likely reflecting renal osteodystrophy. IMPRESSION: 1. Nonspecific mild streaky lower lobe opacity. If there are signs and symptoms of infection consider developing pneumonia. 2. No pleural effusion or other acute cardiopulmonary abnormality. 3. Stable cardiomegaly. Electronically Signed   By: Genevie Ann M.D.   On: 12/30/2017 22:18   Ct Abdomen Pelvis W Contrast  Result Date: 01/02/2018 CLINICAL DATA:  57 year old male with lower abdominal pain, nausea and diarrhea. EXAM: CT ABDOMEN AND PELVIS WITH CONTRAST TECHNIQUE: Multidetector CT imaging of the abdomen and pelvis was performed using the standard protocol following bolus administration of intravenous contrast. CONTRAST:  119mL OMNIPAQUE IOHEXOL 300 MG/ML  SOLN COMPARISON:  Abdominal CT dated 12/07/2017 FINDINGS: Lower chest: The visualized lung bases are clear. There is moderate cardiomegaly. Coronary vascular calcification as well as calcification of the aortic valve. No intra-abdominal free air. Moderate ascites, slightly increased since the prior CT. Hepatobiliary: Morphologic changes of cirrhosis. The gallbladder is partially contracted. No calcified gallstone. Pancreas: Unremarkable. No pancreatic ductal dilatation or surrounding inflammatory changes. Spleen: Normal in size without focal abnormality. Adrenals/Urinary Tract: The adrenal glands are unremarkable. Atrophic kidneys. Multiple bilateral renal hypodense lesions which are not characterized. No  hydronephrosis. The urinary bladder is collapsed. Stomach/Bowel: There is no bowel obstruction. The appendix is normal as visualized. Vascular/Lymphatic: Advanced aortoiliac atherosclerotic disease. No portal venous gas. No adenopathy. Reproductive: The prostate and seminal vesicles are grossly unremarkable. Other: None Musculoskeletal: Diffuse osseous sclerosis consistent with renal osteodystrophy. No acute fracture. IMPRESSION: 1. Cirrhosis with increased ascites. 2. Chronic renal failure with findings of renal osteodystrophy. 3. No bowel obstruction. 4. Cardiomegaly with coronary vascular calcification. 5.  Aortic Atherosclerosis (ICD10-I70.0). Electronically Signed   By: Anner Crete M.D.   On: 01/02/2018 05:07   US Paracentesis  Result Date: 01/21/2018 INDICATION: History of alcoholic cirrhosis. Recurrent ascites. Request for diagnostic and therapeutic paracentesis. EXAM: ULTRASOUND GUIDED LEFT LOWER QUADRANT PARACENTESIS MEDICATIONS: None. COMPLICATIONS: None immediate. PROCEDURE: Informed written consent was obtained from the patient after a discussion of the risks, benefits and alternatives to treatment. A timeout was performed prior to the initiation of the procedure. Initial ultrasound scanning demonstrates a large amount of ascites within the left lower abdominal quadrant. The left lower abdomen was prepped and draped in the usual sterile fashion. 1% lidocaine with epinephrine was used for local anesthesia. Following this, a 19 gauge, 7-cm, Yueh catheter was introduced. An ultrasound image was saved for documentation purposes. The paracentesis was performed. The  catheter was removed and a dressing was applied. The patient tolerated the procedure well without immediate post procedural complication. FINDINGS: A total of approximately 2.1 L of clear yellow fluid was removed. Samples were sent to the laboratory as requested by the clinical team. IMPRESSION: Successful ultrasound-guided paracentesis  yielding 2.1 liters of peritoneal fluid. Read by: Ascencion Dike PA-C Electronically Signed   By: Jacqulynn Cadet M.D.   On: 01/21/2018 12:27   Dg Abd Acute W/chest  Result Date: 01/23/2018 CLINICAL DATA:  Abdominal pain and distention. EXAM: DG ABDOMEN ACUTE W/ 1V CHEST COMPARISON:  CT scan of the abdomen dated 01/02/2018 and chest x-ray dated 12/30/2017 FINDINGS: There are no dilated loops of bowel. Extensive ascites in the abdomen. Extensive arterial vascular calcifications throughout the abdomen. Chronic cardiomegaly. Double lumen central venous catheter in place with the tips in the right atrium, unchanged. Minimal blunting of the left costophrenic angle laterally. Small metallic device in the rectum, unchanged since the prior CT scan. IMPRESSION: Prominent ascites.  No significant change since the prior CT scan. Electronically Signed   By: Lorriane Shire M.D.   On: 01/23/2018 07:40   Dg Abdomen Acute W/chest  Result Date: 12/29/2017 CLINICAL DATA:  Acute on chronic abdominal pain and distention. End-stage renal disease, last dialysis yesterday. EXAM: DG ABDOMEN ACUTE W/ 1V CHEST COMPARISON:  Chest 12/23/2017 FINDINGS: Shallow inspiration with elevation of the left hemidiaphragm and mild linear atelectasis in the left lung base. Cardiac enlargement with mild central vascular congestion. No edema or consolidation. No blunting of costophrenic angles. No pneumothorax. Right central venous catheter with tip over the right atrium. Calcification of the aorta. Gas-filled central mid abdominal small and large bowel with mild distention. There a few air-fluid levels on the upright view. This could represent early small bowel obstruction or enteritis. No free air. No radiopaque stones. Extensive vascular calcifications in the abdomen. Probable surgical clip in the pelvis without change. Degenerative changes in the spine and hips. IMPRESSION: 1. Cardiac enlargement with mild central vascular congestion. No edema  or consolidation. 2. Gas-filled mildly distended central abdominal bowel with a few air-fluid levels suggesting early obstruction or enteritis. 3. Extensive vascular calcifications. Electronically Signed   By: Lucienne Capers M.D.   On: 12/29/2017 21:23   Ir Paracentesis  Result Date: 01/23/2018 INDICATION: Ascites secondary to alcoholic cirrhosis. Request of therapeutic paracentesis. EXAM: ULTRASOUND GUIDED therapeutic PARACENTESIS MEDICATIONS: 10 mL 2% lidocaine COMPLICATIONS: None immediate. PROCEDURE: Informed written consent was obtained from the patient after a discussion of the risks, benefits and alternatives to treatment. A timeout was performed prior to the initiation of the procedure. Initial ultrasound scanning demonstrates a moderate amount of ascites within the left lower abdominal quadrant. The left lower abdomen was prepped and draped in the usual sterile fashion. 2% lidocaine was used for local anesthesia. Following this, a 19 gauge, 7-cm, Yueh catheter was introduced. An ultrasound image was saved for documentation purposes. The paracentesis was performed. The catheter was removed and a dressing was applied. The patient tolerated the procedure well without immediate post procedural complication. FINDINGS: A total of approximately 1.6L of clear yellow fluid was removed. Samples were not sent to the laboratory as requested by the clinical team. IMPRESSION: Successful ultrasound-guided paracentesis yielding 1.6 liters of peritoneal fluid. Read by Candiss Norse, PA-C Electronically Signed   By: Markus Daft M.D.   On: 01/23/2018 10:19   Ir Paracentesis  Result Date: 01/03/2018 INDICATION: Recurrent symptomatic ascites. Please perform ultrasound-guided paracentesis for diagnostic and therapeutic  purposes. EXAM: ULTRASOUND-GUIDED PARACENTESIS COMPARISON:  Multiple previous ultrasound-guided paracenteses, most recently on 12/19/2017; CT abdomen and pelvis - 01/02/2018 MEDICATIONS: None.  COMPLICATIONS: None immediate. TECHNIQUE: Informed written consent was obtained from the patient after a discussion of the risks, benefits and alternatives to treatment. A timeout was performed prior to the initiation of the procedure. Initial ultrasound scanning demonstrates a moderate amount of ascites within the right lower abdominal quadrant. The right lower abdomen was prepped and draped in the usual sterile fashion. 1% lidocaine with epinephrine was used for local anesthesia. A 19 gauge, 7-cm, Yueh catheter was introduced. An ultrasound image was saved for documentation purposed. The paracentesis was performed. The catheter was removed and a dressing was applied. The patient tolerated the procedure well without immediate post procedural complication. FINDINGS: A total of approximately 2.1 liters of serous fluid was removed. Samples were sent to the laboratory as requested by the clinical team. IMPRESSION: Successful ultrasound-guided paracentesis yielding 2.1 liters of peritoneal fluid. Electronically Signed   By: Sandi Mariscal M.D.   On: 01/03/2018 14:46    Microbiology: Recent Results (from the past 240 hour(s))  MRSA PCR Screening     Status: None   Collection Time: 01/21/18  7:40 AM  Result Value Ref Range Status   MRSA by PCR NEGATIVE NEGATIVE Final    Comment:        The GeneXpert MRSA Assay (FDA approved for NASAL specimens only), is one component of a comprehensive MRSA colonization surveillance program. It is not intended to diagnose MRSA infection nor to guide or monitor treatment for MRSA infections. Performed at [facility], [place] [address] [place], [place] [address]   Culture, blood (Routine X 2) w Reflex to ID Panel     Status: None   Collection Time: 01/21/18  8:14 AM  Result Value Ref Range Status   Specimen Description BLOOD LEFT HAND  Final   Special Requests   Final    BOTTLES DRAWN AEROBIC ONLY Blood Culture adequate volume   Culture   Final    NO GROWTH 5  DAYS Performed at [facility], [place] [address] [place], [place] [address]    Report Status 01/26/2018 FINAL  Final  Culture, blood (Routine X 2) w Reflex to ID Panel     Status: None   Collection Time: 01/21/18  8:16 AM  Result Value Ref Range Status   Specimen Description BLOOD LEFT HAND  Final   Special Requests   Final    BOTTLES DRAWN AEROBIC ONLY Blood Culture results may not be optimal due to an inadequate volume of blood received in culture bottles   Culture   Final    NO GROWTH 5 DAYS Performed at [facility], [place] [address], [place], [place] [address]    Report Status 01/26/2018 FINAL  Final  Culture, body fluid-bottle     Status: None   Collection Time: 01/21/18 12:05 PM  Result Value Ref Range Status   Specimen Description PERITONEAL  Final   Special Requests NONE  Final   Culture   Final    NO GROWTH 5 DAYS Performed at [place] [address], [place], [place] [address]    Report Status 01/26/2018 FINAL  Final  Gram stain     Status: None   Collection Time: 01/21/18 12:05 PM  Result Value Ref Range Status   Specimen Description PERITONEAL  Final   Special Requests NONE  Final   Gram Stain  Final    RARE WBC PRESENT, PREDOMINANTLY MONONUCLEAR NO ORGANISMS SEEN Performed at [facility], [place] [address] [place], [place] [address]    Report Status 01/21/2018 FINAL  Final     Labs: Basic Metabolic Panel: Recent Labs  Lab 01/20/18 2214 01/21/18 0500 01/23/18 0627 01/24/18 1504 01/25/18 1047  NA 134* 134* 130* 131* 133*  K 3.7 4.3 6.0* 6.1* 4.7  CL 93* 91* 91* 90*  --   CO2 28 29 23 26   --   GLUCOSE 134* 92 93 116* 94  BUN 15 17 35* 56*  --   CREATININE 5.09* 5.30* 9.15* 10.63*  --   CALCIUM 8.6* 8.8* 9.1 8.7*  --   PHOS  --   --   --  6.4*  --    Liver Function Tests: Recent Labs  Lab 01/20/18 2214 01/23/18 0627 01/24/18 1504  AST 24 21  --   ALT 11 12  --   ALKPHOS 189* 159*  --   BILITOT 0.8 1.1  --   PROT  7.1 6.9  --   ALBUMIN 3.8 3.9 3.8   Recent Labs  Lab 01/20/18 2214 01/23/18 0627  LIPASE 43 29   No results for input(s): AMMONIA in the last 168 hours. CBC: Recent Labs  Lab 01/20/18 2214 01/21/18 0500 01/23/18 0627 01/24/18 1504 01/25/18 1047  WBC 8.7 6.6 9.8 8.5  --   HGB 10.2* 9.8* 10.5* 10.4* 12.2*  HCT 30.4* 29.8* 31.1* 30.9* 36.0*  MCV 80.6 80.8 80.2 80.3  --   PLT 98* 68* 89* 132*  --    Cardiac Enzymes: No results for input(s): CKTOTAL, CKMB, CKMBINDEX, TROPONINI in the last 168 hours. BNP: BNP (last 3 results) Recent Labs    08/20/17 1455 08/29/17 1235 12/23/17 1815  BNP 1,418.4* 943.6* 1,982.7*    ProBNP (last 3 results) No results for input(s): PROBNP in the last 8760 hours.  CBG: No results for input(s): GLUCAP in the last 168 hours.     Signed:  Kayleen Memos, MD [facility] 01/26/2018, 11:18 PM

## 2018-01-26 NOTE — Telephone Encounter (Signed)
Called to schedule IR para this am because pt was hospitalized yesterday and was unable to schedule appt. Pt has already been scheduled for IR para at [facility] for 7/23 and 7/30. Do you know who scheduled these appts?

## 2018-01-27 ENCOUNTER — Telehealth: Payer: Self-pay | Admitting: *Deleted

## 2018-01-27 DIAGNOSIS — D631 Anemia in chronic kidney disease: Secondary | ICD-10-CM | POA: Diagnosis not present

## 2018-01-27 DIAGNOSIS — D509 Iron deficiency anemia, unspecified: Secondary | ICD-10-CM | POA: Diagnosis not present

## 2018-01-27 DIAGNOSIS — N186 End stage renal disease: Secondary | ICD-10-CM | POA: Diagnosis not present

## 2018-01-27 DIAGNOSIS — N2581 Secondary hyperparathyroidism of renal origin: Secondary | ICD-10-CM | POA: Diagnosis not present

## 2018-01-27 NOTE — Telephone Encounter (Signed)
Lmom to sch TIPS Consult in IR./vm

## 2018-01-30 DIAGNOSIS — D631 Anemia in chronic kidney disease: Secondary | ICD-10-CM | POA: Diagnosis not present

## 2018-01-30 DIAGNOSIS — N2581 Secondary hyperparathyroidism of renal origin: Secondary | ICD-10-CM | POA: Diagnosis not present

## 2018-01-30 DIAGNOSIS — N186 End stage renal disease: Secondary | ICD-10-CM | POA: Diagnosis not present

## 2018-01-30 DIAGNOSIS — D509 Iron deficiency anemia, unspecified: Secondary | ICD-10-CM | POA: Diagnosis not present

## 2018-01-31 ENCOUNTER — Telehealth: Payer: Self-pay | Admitting: Internal Medicine

## 2018-01-31 ENCOUNTER — Ambulatory Visit (HOSPITAL_COMMUNITY): Payer: Medicare Other

## 2018-01-31 NOTE — Telephone Encounter (Signed)
Pyrtle pt with history of alcoholic cirrhosis calling stating he is having a lot of discomfort and requesting paracentesis this week instead of 7/30 appt that is scheduled. Pt had a large volume paracentesis as an inpt recently. Dr. Hilarie Fredrickson has stated next week should be good since recent LVP (phone noted dated 7/17). Dr. Silverio Decamp as DOD please advise.

## 2018-01-31 NOTE — Telephone Encounter (Signed)
Called pt back and he has dialysis on Mondays and Wednesdays. He states he does not have transportation to come for labs, he would have to take the bus. States he is very uncomfortable. Please advise.

## 2018-01-31 NOTE — Telephone Encounter (Signed)
Last Cr is elevated >10. Please advise patient to come for labs/BMP. He only had 1.6L removed last week and prior to that 2L. Do not recommend very frequent paracentesis as it will cause worsening of renal function. He should keep the appointment as scheduled

## 2018-02-01 DIAGNOSIS — N186 End stage renal disease: Secondary | ICD-10-CM | POA: Diagnosis not present

## 2018-02-01 DIAGNOSIS — N2581 Secondary hyperparathyroidism of renal origin: Secondary | ICD-10-CM | POA: Diagnosis not present

## 2018-02-01 DIAGNOSIS — D509 Iron deficiency anemia, unspecified: Secondary | ICD-10-CM | POA: Diagnosis not present

## 2018-02-01 DIAGNOSIS — D631 Anemia in chronic kidney disease: Secondary | ICD-10-CM | POA: Diagnosis not present

## 2018-02-01 NOTE — Telephone Encounter (Signed)
Please request dialysis center to send any recent labs or to draw the BMP and send to [place]. Thanks

## 2018-02-01 NOTE — Telephone Encounter (Signed)
Spoke with kidney center [phone]) and they are going to fax labs on pt. They are drawing new labs today but the results will not be back until Friday.

## 2018-02-01 NOTE — Telephone Encounter (Signed)
Received previous labs from kidney center. Placed lab report on your desk.  Serum creatinine 4/24 6.41 3/27 8.65 2/27 7.26 1/23 8.16

## 2018-02-02 ENCOUNTER — Emergency Department (HOSPITAL_COMMUNITY)
Admission: EM | Admit: 2018-02-02 | Discharge: 2018-02-03 | Disposition: A | Discharge status: Home / Self Care | Payer: Medicare Other | Attending: Emergency Medicine | Admitting: Emergency Medicine

## 2018-02-02 ENCOUNTER — Encounter (HOSPITAL_COMMUNITY): Payer: Self-pay

## 2018-02-02 DIAGNOSIS — J449 Chronic obstructive pulmonary disease, unspecified: Secondary | ICD-10-CM | POA: Insufficient documentation

## 2018-02-02 DIAGNOSIS — I2510 Atherosclerotic heart disease of native coronary artery without angina pectoris: Secondary | ICD-10-CM | POA: Diagnosis not present

## 2018-02-02 DIAGNOSIS — F17210 Nicotine dependence, cigarettes, uncomplicated: Secondary | ICD-10-CM | POA: Diagnosis not present

## 2018-02-02 DIAGNOSIS — I4891 Unspecified atrial fibrillation: Secondary | ICD-10-CM | POA: Insufficient documentation

## 2018-02-02 DIAGNOSIS — Z79899 Other long term (current) drug therapy: Secondary | ICD-10-CM | POA: Diagnosis not present

## 2018-02-02 DIAGNOSIS — R0600 Dyspnea, unspecified: Secondary | ICD-10-CM

## 2018-02-02 DIAGNOSIS — I252 Old myocardial infarction: Secondary | ICD-10-CM | POA: Diagnosis not present

## 2018-02-02 DIAGNOSIS — N186 End stage renal disease: Secondary | ICD-10-CM | POA: Diagnosis not present

## 2018-02-02 DIAGNOSIS — R1084 Generalized abdominal pain: Secondary | ICD-10-CM | POA: Diagnosis not present

## 2018-02-02 DIAGNOSIS — I10 Essential (primary) hypertension: Secondary | ICD-10-CM | POA: Diagnosis not present

## 2018-02-02 DIAGNOSIS — Z992 Dependence on renal dialysis: Secondary | ICD-10-CM | POA: Diagnosis not present

## 2018-02-02 DIAGNOSIS — I120 Hypertensive chronic kidney disease with stage 5 chronic kidney disease or end stage renal disease: Secondary | ICD-10-CM | POA: Insufficient documentation

## 2018-02-02 LAB — COMPREHENSIVE METABOLIC PANEL
ALT: 13 U/L (ref 0–44)
AST: 22 U/L (ref 15–41)
Albumin: 3.8 g/dL (ref 3.5–5.0)
Alkaline Phosphatase: 154 U/L — ABNORMAL HIGH (ref 38–126)
Anion gap: 14 (ref 5–15)
BUN: 17 mg/dL (ref 6–20)
CO2: 26 mmol/L (ref 22–32)
Calcium: 8.5 mg/dL — ABNORMAL LOW (ref 8.9–10.3)
Chloride: 97 mmol/L — ABNORMAL LOW (ref 98–111)
Creatinine, Ser: 7.44 mg/dL — ABNORMAL HIGH (ref 0.61–1.24)
GFR calc Af Amer: 8 mL/min — ABNORMAL LOW (ref >60)
GFR calc non Af Amer: 7 mL/min — ABNORMAL LOW (ref >60)
Glucose, Bld: 97 mg/dL (ref 70–99)
Potassium: 4.4 mmol/L (ref 3.5–5.1)
Sodium: 137 mmol/L (ref 135–145)
Total Bilirubin: 0.8 mg/dL (ref 0.3–1.2)
Total Protein: 7.3 g/dL (ref 6.5–8.1)

## 2018-02-02 LAB — CBC
HCT: 35.7 % — ABNORMAL LOW (ref 39.0–52.0)
Hemoglobin: 12 g/dL — ABNORMAL LOW (ref 13.0–17.0)
MCH: 27.3 pg (ref 26.0–34.0)
MCHC: 33.6 g/dL (ref 30.0–36.0)
MCV: 81.3 fL (ref 78.0–100.0)
Platelets: 140 10*3/uL — ABNORMAL LOW (ref 150–400)
RBC: 4.39 MIL/uL (ref 4.22–5.81)
RDW: 17.6 % — ABNORMAL HIGH (ref 11.5–15.5)
WBC: 9.1 10*3/uL (ref 4.0–10.5)

## 2018-02-02 LAB — LIPASE, BLOOD: Lipase: 37 U/L (ref 11–51)

## 2018-02-02 NOTE — Telephone Encounter (Signed)
Appears his renal function has progressively worsened, based on labs in epic most recent Cr is 12. Dialysis should help with volume management. Ok to schedule paracentesis earlier to palliate his symptoms. Patient will need discussion of goals of care

## 2018-02-02 NOTE — Telephone Encounter (Signed)
Pt has dialysis M-W-F, will keep appt scheduled for Tuesday.

## 2018-02-02 NOTE — ED Triage Notes (Signed)
Pt comes from home via [facility] for abd pain. Denies n/v/d states that he has fluid in his abdomen. Receives dialysis on MWF, received yesterday all of treatment

## 2018-02-02 NOTE — ED Notes (Addendum)
Pt reports he is supposed to have paracentesis on Tuesday and cant wait due to pain and SOB, pt states that he does not want chest x ray done

## 2018-02-03 MED ORDER — IPRATROPIUM-ALBUTEROL 0.5-2.5 (3) MG/3ML IN SOLN
3.0000 mL | Freq: Once | RESPIRATORY_TRACT | Status: DC
  Filled 2018-02-03: qty 3

## 2018-02-03 NOTE — ED Provider Notes (Signed)
[place] EMERGENCY DEPARTMENT Provider Note   CSN: [id number] Arrival date & time: 02/02/18  2235     History   Chief Complaint Chief Complaint  Patient presents with  . Abdominal Pain  . Shortness of Breath    HPI [name] is a 57 y.o. male.  Patient with past medical history remarkable for ascites, end-stage renal on dialysis, Monday, Wednesday, Friday, who presents to the emergency department with a chief complaint of abdominal pain and shortness of breath.  He states that he is scheduled to have dialysis tomorrow.  States that he is scheduled for a paracentesis in approximately 4 days.  States that due to worsening symptoms he came to the emergency department.  He states that he is primarily concerned with his pain.  He denies any fever, chills, nausea, vomiting.  There are no aggravating factors.  The history is provided by the patient. No language interpreter was used.    Past Medical History:  Diagnosis Date  . Anemia   . Anxiety   . Arthritis    left shoulder  . Atherosclerosis of aorta ([place])   . Cardiomegaly   . Chest pain    DATE UNKNOWN, C/O PERIODICALLY  . Cocaine abuse ([place])   . COPD exacerbation ([place]) 08/17/2016  . Coronary artery disease    stent 02/22/17  . ESRD (end stage renal disease) on dialysis ([place])    "[name]; MWF" (07/04/2017)  . GERD (gastroesophageal reflux disease)    DATE UNKNOWN  . Hemorrhoids   . Hepatitis B, chronic ([place])   . Hepatitis C   . History of kidney stones   . Hyperkalemia   . Hypertension   . Metabolic bone disease    Patient denies  . Mitral stenosis   . Myocardial infarction ([place])   . Pneumonia   . Pulmonary edema   . Solitary rectal ulcer syndrome 07/2017   at flex sig for rectal bleeding  . Tubular adenoma of colon     Patient Active Problem List   Diagnosis Date Noted  . Benign neoplasm of cecum   . Benign neoplasm of ascending colon   . Benign neoplasm of descending  colon   . Benign neoplasm of rectum   . Atrial fibrillation ([place]) 01/23/2018  . Hx of colonic polyps 01/20/2018  . ESRD (end stage renal disease) ([place]) 11/21/2017  . GERD (gastroesophageal reflux disease) 11/16/2017  . Liver cirrhosis ([place]) 11/15/2017  . DNR (do not resuscitate)   . Palliative care by specialist   . Hyponatremia 11/04/2017  . SBP (spontaneous bacterial peritonitis) ([place]) 10/30/2017  . Liver disease, chronic 10/30/2017  . SOB (shortness of breath)   . Abdominal pain 10/28/2017  . Upper airway cough syndrome with flattening on f/v loop 10/13/17 c/w vcd 10/17/2017  . Elevated diaphragm 10/13/2017  . Ileus ([place]) 09/29/2017  . Malnutrition of moderate degree 09/29/2017  . Sinus congestion 09/03/2017  . Symptomatic anemia 09/02/2017  . Other cirrhosis of liver ([place]) 09/02/2017  . Left bundle branch block 09/02/2017  . Mitral stenosis 09/02/2017  . Hematochezia 07/15/2017  . Wide-complex tachycardia ([place])   . Endotracheally intubated   . ESRD on dialysis ([place]) 07/04/2017  . Acute respiratory failure with hypoxia ([place]) 06/18/2017  . CKD (chronic kidney disease) stage V requiring chronic dialysis ([place]) 06/18/2017  . History of Cocaine abuse ([place]) 06/18/2017  . Hypertension 06/18/2017  . Infection of AV graft for dialysis ([place]) 06/18/2017  . Anxiety 06/18/2017  . Anemia  due to chronic kidney disease 06/18/2017  . Atrial flutter with rapid ventricular response ([place]) 06/18/2017  . Personality disorder ([place]) 06/13/2017  . Cellulitis 06/12/2017  . Adjustment disorder with mixed anxiety and depressed mood 06/10/2017  . Suicidal ideation 06/10/2017  . Arm wound, left, sequela 06/10/2017  . Dyspnea on exertion 05/29/2017  . Tachycardia 05/29/2017  . Hyperkalemia 05/22/2017  . Acute metabolic encephalopathy   . Anemia 04/23/2017  . Ascites 04/23/2017  . COPD (chronic obstructive pulmonary disease) ([place]) 04/23/2017  . Acute on chronic respiratory failure with hypoxia ([place])  03/25/2017  . Arrhythmia 03/25/2017  . COPD GOLD 0 with flattening on inps f/v  09/27/2016  . Essential hypertension 09/27/2016  . Fluid overload 08/30/2016  . COPD exacerbation ([place]) 08/17/2016  . Hypertensive urgency 08/17/2016  . Respiratory failure ([place]) 08/17/2016  . Problem with dialysis access ([place]) 07/23/2016  . Chronic hepatitis B ([place]) 03/05/2014  . Chronic hepatitis C without hepatic coma ([place]) 03/05/2014  . Internal hemorrhoids with bleeding, swelling and itching 03/05/2014  . Thrombocytopenia ([place]) 03/05/2014  . Chest pain 02/27/2014  . Alcohol abuse 04/14/2009  . Cigarette smoker 04/14/2009  . GANGLION CYST 04/14/2009    Past Surgical History:  Procedure Laterality Date  . A/V FISTULAGRAM Left 05/26/2017   Procedure: A/V FISTULAGRAM;  Surgeon: Conrad Dover, MD;  Location: [place] [facility];  Service: Cardiovascular;  Laterality: Left;  . A/V FISTULAGRAM Right 11/18/2017   Procedure: A/V FISTULAGRAM - Right Arm;  Surgeon: Elam Dutch, MD;  Location: [place] [facility];  Service: Cardiovascular;  Laterality: Right;  . APPLICATION OF WOUND VAC Left 06/14/2017   Procedure: APPLICATION OF WOUND VAC;  Surgeon: Katha Cabal, MD;  Location: [facility] ORS;  Service: Vascular;  Laterality: Left;  . AV FISTULA PLACEMENT  2012   BELIEVED WAS PLACED IN JUNE  . AV FISTULA PLACEMENT Right 08/09/2017   Procedure: Creation Right arm ARTERIOVENOUS BRACHIOCEPOHALIC FISTULA;  Surgeon: Elam Dutch, MD;  Location[facility];  Service: Vascular;  Laterality: Right;  . AV FISTULA PLACEMENT Right 11/22/2017   Procedure: INSERTION OF ARTERIOVENOUS (AV) GORE-TEX GRAFT RIGHT UPPER ARM;  Surgeon: Elam Dutch, MD;  Location: [place];  Service: Vascular;  Laterality: Right;  . BIOPSY  01/25/2018   Procedure: BIOPSY;  Surgeon: Jerene Bears, MD;  Location: [facility] ENDOSCOPY;  Service: Gastroenterology;;  . COLONOSCOPY    . COLONOSCOPY WITH PROPOFOL N/A 01/25/2018   Procedure: COLONOSCOPY WITH  PROPOFOL;  Surgeon: Jerene Bears, MD;  Location: [place];  Service: Gastroenterology;  Laterality: N/A;  . CORONARY STENT INTERVENTION N/A 02/22/2017   Procedure: CORONARY STENT INTERVENTION;  Surgeon: Nigel Mormon, MD;  Location: [place] [facility];  Service: Cardiovascular;  Laterality: N/A;  . ESOPHAGOGASTRODUODENOSCOPY (EGD) WITH PROPOFOL N/A 01/25/2018   Procedure: ESOPHAGOGASTRODUODENOSCOPY (EGD) WITH PROPOFOL;  Surgeon: Jerene Bears, MD;  Location: [place];  Service: Gastroenterology;  Laterality: N/A;  . FLEXIBLE SIGMOIDOSCOPY N/A 07/15/2017   Procedure: FLEXIBLE SIGMOIDOSCOPY;  Surgeon: Carol Ada, MD;  Location: [place];  Service: Endoscopy;  Laterality: N/A;  . HEMORRHOID BANDING    . I&D EXTREMITY Left 06/01/2017   Procedure: IRRIGATION AND DEBRIDEMENT LEFT ARM HEMATOMA WITH LIGATION OF LEFT ARM AV FISTULA;  Surgeon: Elam Dutch, MD;  Location: [place];  Service: Vascular;  Laterality: Left;  . I&D EXTREMITY Left 06/14/2017   Procedure: IRRIGATION AND DEBRIDEMENT EXTREMITY;  Surgeon: Katha Cabal, MD;  Location: [facility] ORS;  Service: Vascular;  Laterality: Left;  . INSERTION OF DIALYSIS CATHETER  05/30/2017  . INSERTION OF DIALYSIS CATHETER N/A 05/30/2017   Procedure: INSERTION OF DIALYSIS CATHETER;  Surgeon: Elam Dutch, MD;  Location: [place];  Service: Vascular;  Laterality: N/A;  . IR PARACENTESIS  08/30/2017  . IR PARACENTESIS  09/29/2017  . IR PARACENTESIS  10/28/2017  . IR PARACENTESIS  11/09/2017  . IR PARACENTESIS  11/16/2017  . IR PARACENTESIS  11/28/2017  . IR PARACENTESIS  12/01/2017  . IR PARACENTESIS  12/06/2017  . IR PARACENTESIS  01/03/2018  . IR PARACENTESIS  01/23/2018  . LEFT HEART CATH AND CORONARY ANGIOGRAPHY N/A 02/22/2017   Procedure: LEFT HEART CATH AND CORONARY ANGIOGRAPHY;  Surgeon: Nigel Mormon, MD;  Location: [place] [facility];  Service: Cardiovascular;  Laterality: N/A;  . LIGATION OF ARTERIOVENOUS  FISTULA Left  7/0/1779   Procedure: Plication of Left Arm Arteriovenous Fistula;  Surgeon: Elam Dutch, MD;  Location: [place];  Service: Vascular;  Laterality: Left;  . POLYPECTOMY    . POLYPECTOMY  01/25/2018   Procedure: POLYPECTOMY;  Surgeon: Jerene Bears, MD;  Location: [place];  Service: Gastroenterology;;  . REVISON OF ARTERIOVENOUS FISTULA Left 3/90/3009   Procedure: PLICATION OF DISTAL ANEURYSMAL SEGEMENT OF LEFT UPPER ARM ARTERIOVENOUS FISTULA;  Surgeon: Elam Dutch, MD;  Location: [place];  Service: Vascular;  Laterality: Left;  . REVISON OF ARTERIOVENOUS FISTULA Left 2/33/0076   Procedure: Plication of Left Upper Arm Fistula ;  Surgeon: Waynetta Sandy, MD;  Location: [place];  Service: Vascular;  Laterality: Left;  . SKIN GRAFT SPLIT THICKNESS LEG / FOOT Left    SKIN GRAFT SPLIT THICKNESS LEFT ARM DONOR SITE: LEFT ANTERIOR THIGH  . SKIN SPLIT GRAFT Left 07/04/2017   Procedure: SKIN GRAFT SPLIT THICKNESS LEFT ARM DONOR SITE: LEFT ANTERIOR THIGH;  Surgeon: Elam Dutch, MD;  Location: [place];  Service: Vascular;  Laterality: Left;  . THROMBECTOMY W/ EMBOLECTOMY Left 06/05/2017   Procedure: EXPLORATION OF LEFT ARM FOR BLEEDING; OVERSEWED PROXIMAL FISTULA;  Surgeon: Angelia Mould, MD;  Location: [place];  Service: Vascular;  Laterality: Left;  . WOUND EXPLORATION Left 06/03/2017   Procedure: WOUND EXPLORATION WITH WOUND VAC APPLICATION TO LEFT ARM;  Surgeon: Angelia Mould, MD;  Location: [place];  Service: Vascular;  Laterality: Left;        Home Medications    Prior to Admission medications   Medication Sig Start Date End Date Taking? Authorizing Provider  albuterol (VENTOLIN HFA) 108 (90 Base) MCG/ACT inhaler Inhale 2 puffs into the lungs every 6 (six) hours as needed for wheezing or shortness of breath.   Yes [provider]  diltiazem (CARDIZEM CD) 120 MG 24 hr capsule Take 1 capsule (120 mg total) by mouth daily. 06/23/17 06/23/18 Yes Shahmehdi,  Seyed A, MD  gabapentin (NEURONTIN) 100 MG capsule Take 100 mg by mouth 2 (two) times daily. 12/13/17  Yes [provider]  hydrALAZINE (APRESOLINE) 100 MG tablet Take 100 mg by mouth 2 (two) times daily. Taking after HD on Dialysis days   Yes [provider]  lactulose, encephalopathy, (CHRONULAC) 10 GM/15ML SOLN Take 15 ml's by mouth twice daily. Patient taking differently: Take 10 g by mouth 2 (two) times daily.  01/17/18  Yes Esterwood, Amy S, PA-C  oxyCODONE (OXY IR/ROXICODONE) 5 MG immediate release tablet Take 5 mg by mouth 2 (two) times daily as needed for pain. 12/13/17  Yes [provider]  sevelamer  carbonate (RENVELA) 800 MG tablet Take 4 tablets with meals  And 2 tablets twice daily with snacks Patient taking differently: Take 1,600-3,200 mg by mouth See admin instructions. Take 4 tablets with meals  And 2 tablets twice daily with snacks 01/22/18  Yes Patrecia Pour, MD  traZODone (DESYREL) 150 MG tablet Take 150 mg by mouth at bedtime as needed for sleep.   Yes [provider]    Family History Family History  Problem Relation Age of Onset  . Heart disease Mother   . Lung cancer Mother   . Heart disease Father   . Malignant hyperthermia Father   . COPD Father   . Throat cancer Sister   . Esophageal cancer Sister   . Hypertension Other   . COPD Other   . Colon cancer Neg Hx   . Colon polyps Neg Hx   . Rectal cancer Neg Hx   . Stomach cancer Neg Hx     Social History Social History   Tobacco Use  . Smoking status: Current Every Day Smoker    Packs/day: 0.50    Years: 43.00    Pack years: 21.50    Types: Cigarettes    Start date: 08/13/1973  . Smokeless tobacco: Never Used  Substance Use Topics  . Alcohol use: Not Currently    Frequency: Never    Comment: quit drinking in 2017  . Drug use: Yes    Types: Marijuana    Comment: quit in 2017"     Allergies   Morphine and related; Aspirin; Clonidine derivatives; Tramadol; and Tylenol  [acetaminophen]   Review of Systems Review of Systems  All other systems reviewed and are negative.    Physical Exam Updated Vital Signs BP (!) 159/78   Pulse 69   Temp 97.7 F (36.5 C)   Resp 18   SpO2 98%   Physical Exam  Constitutional: He is oriented to person, place, and time. He appears well-developed and well-nourished.  HENT:  Head: Normocephalic and atraumatic.  Eyes: Pupils are equal, round, and reactive to light. Conjunctivae and EOM are normal. Right eye exhibits no discharge. Left eye exhibits no discharge. No scleral icterus.  Neck: Normal range of motion. Neck supple. No JVD present.  Cardiovascular: Normal rate, regular rhythm and normal heart sounds. Exam reveals no gallop and no friction rub.  No murmur heard. Pulmonary/Chest: Effort normal and breath sounds normal. No respiratory distress. He has no wheezes. He has no rales. He exhibits no tenderness.  Abdominal: Soft. He exhibits distension and ascites. He exhibits no mass. There is no tenderness. There is no rebound and no guarding.  Musculoskeletal: Normal range of motion. He exhibits no edema or tenderness.  Neurological: He is alert and oriented to person, place, and time.  Skin: Skin is warm and dry.  Psychiatric: He has a normal mood and affect. His behavior is normal. Judgment and thought content normal.  Nursing note and vitals reviewed.    ED Treatments / Results  Labs (all labs ordered are listed, but only abnormal results are displayed) Labs Reviewed  COMPREHENSIVE METABOLIC PANEL - Abnormal; Notable for the following components:      Result Value   Chloride 97 (*)    Creatinine, Ser 7.44 (*)    Calcium 8.5 (*)    Alkaline Phosphatase 154 (*)    GFR calc non Af Amer 7 (*)    GFR calc Af Amer 8 (*)    All other components within normal limits  CBC - Abnormal; Notable for the following components:   Hemoglobin 12.0 (*)    HCT 35.7 (*)    RDW 17.6 (*)    Platelets 140 (*)    All other  components within normal limits  LIPASE, BLOOD    EKG EKG Interpretation  Date/Time:  Thursday February 02 2018 22:47:14 EDT Ventricular Rate:  113 PR Interval:    QRS Duration: 166 QT Interval:  438 QTC Calculation: 600 R Axis:   165 Text Interpretation:  Atrial fibrillation with rapid ventricular response Right axis deviation Non-specific intra-ventricular conduction block Abnormal ECG No significant change since last tracing Confirmed by Addison Lank [phone]) on 02/03/2018 1:02:38 AM   Radiology No results found.  Procedures Procedures (including critical care time)  Medications Ordered in ED Medications  ipratropium-albuterol (DUONEB) 0.5-2.5 (3) MG/3ML nebulizer solution 3 mL (has no administration in time range)     Initial Impression / Assessment and Plan / ED Course  I have reviewed the triage vital signs and the nursing notes.  Pertinent labs & imaging results that were available during my care of the patient were reviewed by me and considered in my medical decision making (see chart for details).     Patient with a ascites, shortness of breath, and abdominal pain.  Vital signs are stable.  Patient is scheduled to be dialyzed in the morning.  He has a paracentesis ordered/scheduled for early next week.  Patient asked repeatedly for narcotic pain medicine.  Patient seen by discussed and discussed with Dr. Leonette Monarch, who states that in the absence of respiratory distress, with normal vital signs and reassuring lab work and the fact that the patient has dialysis in the morning, he can be discharged.  Final Clinical Impressions(s) / ED Diagnoses   Final diagnoses:  Generalized abdominal pain  Dyspnea, unspecified type    ED Discharge Orders    None       Montine Circle, PA-C 02/03/18 0346    Fatima Blank, MD 02/03/18 1650

## 2018-02-03 NOTE — ED Notes (Signed)
Patient verbalizes understanding of discharge instructions. Opportunity for questioning and answers were provided. Armband removed by staff, pt discharged from ED ambulatory with bus pass.

## 2018-02-06 DIAGNOSIS — N2581 Secondary hyperparathyroidism of renal origin: Secondary | ICD-10-CM | POA: Diagnosis not present

## 2018-02-06 DIAGNOSIS — D631 Anemia in chronic kidney disease: Secondary | ICD-10-CM | POA: Diagnosis not present

## 2018-02-06 DIAGNOSIS — D509 Iron deficiency anemia, unspecified: Secondary | ICD-10-CM | POA: Diagnosis not present

## 2018-02-06 DIAGNOSIS — N186 End stage renal disease: Secondary | ICD-10-CM | POA: Diagnosis not present

## 2018-02-07 ENCOUNTER — Ambulatory Visit (HOSPITAL_COMMUNITY)
Admission: RE | Admit: 2018-02-07 | Discharge: 2018-02-07 | Disposition: A | Discharge status: Home / Self Care | Payer: Medicare Other | Source: Ambulatory Visit | Attending: Internal Medicine | Admitting: Internal Medicine

## 2018-02-07 ENCOUNTER — Other Ambulatory Visit: Payer: Self-pay

## 2018-02-07 ENCOUNTER — Encounter (HOSPITAL_COMMUNITY): Payer: Self-pay | Admitting: Physician Assistant

## 2018-02-07 DIAGNOSIS — N186 End stage renal disease: Secondary | ICD-10-CM | POA: Diagnosis not present

## 2018-02-07 DIAGNOSIS — B182 Chronic viral hepatitis C: Secondary | ICD-10-CM | POA: Diagnosis not present

## 2018-02-07 DIAGNOSIS — Z992 Dependence on renal dialysis: Secondary | ICD-10-CM | POA: Diagnosis not present

## 2018-02-07 DIAGNOSIS — K7460 Unspecified cirrhosis of liver: Secondary | ICD-10-CM

## 2018-02-07 DIAGNOSIS — R188 Other ascites: Secondary | ICD-10-CM

## 2018-02-07 DIAGNOSIS — B1910 Unspecified viral hepatitis B without hepatic coma: Secondary | ICD-10-CM | POA: Insufficient documentation

## 2018-02-07 HISTORY — PX: IR PARACENTESIS: IMG2679

## 2018-02-07 LAB — BODY FLUID CELL COUNT WITH DIFFERENTIAL
Eos, Fluid: 0 %
Lymphs, Fluid: 16 %
Monocyte-Macrophage-Serous Fluid: 84 % (ref 50–90)
Neutrophil Count, Fluid: 0 % (ref 0–25)
Other Cells, Fluid: 0 %
Total Nucleated Cell Count, Fluid: 235 cu mm (ref 0–1000)

## 2018-02-07 LAB — GRAM STAIN

## 2018-02-07 MED ORDER — LIDOCAINE HCL (PF) 2 % IJ SOLN
INTRAMUSCULAR | Status: AC
  Filled 2018-02-07: qty 10

## 2018-02-07 MED ORDER — LIDOCAINE HCL (PF) 2 % IJ SOLN
INTRAMUSCULAR | Status: DC | PRN
  Administered 2018-02-07: 15 mL

## 2018-02-07 MED ORDER — LIDOCAINE HCL 1 % IJ SOLN
INTRAMUSCULAR | Status: DC | PRN
  Administered 2018-02-07: 10 mL

## 2018-02-07 MED ORDER — LIDOCAINE HCL (PF) 2 % IJ SOLN
INTRAMUSCULAR | Status: AC
  Filled 2018-02-07: qty 20

## 2018-02-07 MED ORDER — LIDOCAINE HCL 1 % IJ SOLN
INTRAMUSCULAR | Status: AC
  Filled 2018-02-07: qty 20

## 2018-02-07 NOTE — Progress Notes (Signed)
.  ir

## 2018-02-07 NOTE — Procedures (Signed)
PROCEDURE SUMMARY:  Successful image-guided paracentesis from the left abdomen.  Yielded 4.3 liters of yellow fluid.  No immediate complications.  Patient tolerated well.   Specimen was sent for labs.  Joaquim Nam PA-C 02/07/2018 3:20 PM

## 2018-02-08 DIAGNOSIS — D631 Anemia in chronic kidney disease: Secondary | ICD-10-CM | POA: Diagnosis not present

## 2018-02-08 DIAGNOSIS — N186 End stage renal disease: Secondary | ICD-10-CM | POA: Diagnosis not present

## 2018-02-08 DIAGNOSIS — N2581 Secondary hyperparathyroidism of renal origin: Secondary | ICD-10-CM | POA: Diagnosis not present

## 2018-02-08 DIAGNOSIS — D509 Iron deficiency anemia, unspecified: Secondary | ICD-10-CM | POA: Diagnosis not present

## 2018-02-08 LAB — PATHOLOGIST SMEAR REVIEW: Path Review: REACTIVE

## 2018-02-09 ENCOUNTER — Other Ambulatory Visit: Payer: Medicare Other

## 2018-02-09 ENCOUNTER — Telehealth: Payer: Self-pay

## 2018-02-09 ENCOUNTER — Telehealth (HOSPITAL_COMMUNITY): Payer: Self-pay

## 2018-02-09 DIAGNOSIS — Z992 Dependence on renal dialysis: Secondary | ICD-10-CM | POA: Diagnosis not present

## 2018-02-09 DIAGNOSIS — I120 Hypertensive chronic kidney disease with stage 5 chronic kidney disease or end stage renal disease: Secondary | ICD-10-CM | POA: Diagnosis not present

## 2018-02-09 DIAGNOSIS — N186 End stage renal disease: Secondary | ICD-10-CM | POA: Diagnosis not present

## 2018-02-09 DIAGNOSIS — G8929 Other chronic pain: Secondary | ICD-10-CM | POA: Diagnosis not present

## 2018-02-09 DIAGNOSIS — M79602 Pain in left arm: Secondary | ICD-10-CM | POA: Diagnosis not present

## 2018-02-09 MED ORDER — SULFAMETHOXAZOLE-TRIMETHOPRIM 800-160 MG PO TABS: 1.0000 | ORAL_TABLET | Freq: Two times a day (BID) | ORAL | 0 refills | Status: DC

## 2018-02-09 NOTE — Telephone Encounter (Signed)
Per Dr. Hilarie Fredrickson pt to start Bactrim DS twice daily for 7 days. Script sent to pharmacy. Pt aware. See result note.

## 2018-02-09 NOTE — Telephone Encounter (Signed)
Per Dr. Tyrone Nine and Earnest Bailey, Charge RN, Dr. Tyrone Nine received a phone call from microbiology that pt. Has a positive Body Fluid Culture, gram Positive Cocci.  I called pt. And informed him of his results per Dr. Tyrone Nine and checked on how he was feeling, and advised pt. If he was not feeling any better he should return to the ED or follow up with his PCP>  Pt. Stated, "I don't feel any better and I will come back to the ED today."  Pt. Verbalized understanding and all questions answered.

## 2018-02-10 DIAGNOSIS — D509 Iron deficiency anemia, unspecified: Secondary | ICD-10-CM | POA: Diagnosis not present

## 2018-02-10 DIAGNOSIS — N2581 Secondary hyperparathyroidism of renal origin: Secondary | ICD-10-CM | POA: Diagnosis not present

## 2018-02-10 DIAGNOSIS — N186 End stage renal disease: Secondary | ICD-10-CM | POA: Diagnosis not present

## 2018-02-10 DIAGNOSIS — D631 Anemia in chronic kidney disease: Secondary | ICD-10-CM | POA: Diagnosis not present

## 2018-02-13 ENCOUNTER — Encounter (HOSPITAL_COMMUNITY): Payer: Self-pay | Admitting: Emergency Medicine

## 2018-02-13 ENCOUNTER — Other Ambulatory Visit: Payer: Self-pay

## 2018-02-13 ENCOUNTER — Emergency Department (HOSPITAL_COMMUNITY)
Admission: EM | Admit: 2018-02-13 | Discharge: 2018-02-13 | Disposition: A | Discharge status: Home / Self Care | Payer: Medicare Other | Source: Home / Self Care | Attending: Emergency Medicine | Admitting: Emergency Medicine

## 2018-02-13 DIAGNOSIS — R10814 Left lower quadrant abdominal tenderness: Secondary | ICD-10-CM | POA: Diagnosis not present

## 2018-02-13 DIAGNOSIS — D631 Anemia in chronic kidney disease: Secondary | ICD-10-CM | POA: Diagnosis not present

## 2018-02-13 DIAGNOSIS — Z5321 Procedure and treatment not carried out due to patient leaving prior to being seen by health care provider: Secondary | ICD-10-CM | POA: Insufficient documentation

## 2018-02-13 DIAGNOSIS — N2581 Secondary hyperparathyroidism of renal origin: Secondary | ICD-10-CM | POA: Diagnosis not present

## 2018-02-13 DIAGNOSIS — J9621 Acute and chronic respiratory failure with hypoxia: Secondary | ICD-10-CM | POA: Diagnosis not present

## 2018-02-13 DIAGNOSIS — R1084 Generalized abdominal pain: Secondary | ICD-10-CM | POA: Diagnosis not present

## 2018-02-13 DIAGNOSIS — I10 Essential (primary) hypertension: Secondary | ICD-10-CM | POA: Diagnosis not present

## 2018-02-13 DIAGNOSIS — N186 End stage renal disease: Secondary | ICD-10-CM | POA: Diagnosis not present

## 2018-02-13 DIAGNOSIS — R0603 Acute respiratory distress: Secondary | ICD-10-CM | POA: Diagnosis not present

## 2018-02-13 DIAGNOSIS — J811 Chronic pulmonary edema: Secondary | ICD-10-CM | POA: Diagnosis not present

## 2018-02-13 DIAGNOSIS — D509 Iron deficiency anemia, unspecified: Secondary | ICD-10-CM | POA: Diagnosis not present

## 2018-02-13 DIAGNOSIS — I120 Hypertensive chronic kidney disease with stage 5 chronic kidney disease or end stage renal disease: Secondary | ICD-10-CM | POA: Diagnosis not present

## 2018-02-13 DIAGNOSIS — I4892 Unspecified atrial flutter: Secondary | ICD-10-CM | POA: Diagnosis not present

## 2018-02-13 DIAGNOSIS — R188 Other ascites: Secondary | ICD-10-CM | POA: Diagnosis not present

## 2018-02-13 DIAGNOSIS — E871 Hypo-osmolality and hyponatremia: Secondary | ICD-10-CM | POA: Diagnosis not present

## 2018-02-13 LAB — CBC
HCT: 34.1 % — ABNORMAL LOW (ref 39.0–52.0)
Hemoglobin: 11.7 g/dL — ABNORMAL LOW (ref 13.0–17.0)
MCH: 27 pg (ref 26.0–34.0)
MCHC: 34.3 g/dL (ref 30.0–36.0)
MCV: 78.8 fL (ref 78.0–100.0)
Platelets: 114 10*3/uL — ABNORMAL LOW (ref 150–400)
RBC: 4.33 MIL/uL (ref 4.22–5.81)
RDW: 15.7 % — ABNORMAL HIGH (ref 11.5–15.5)
WBC: 8.9 10*3/uL (ref 4.0–10.5)

## 2018-02-13 LAB — COMPREHENSIVE METABOLIC PANEL
ALT: 14 U/L (ref 0–44)
AST: 30 U/L (ref 15–41)
Albumin: 3.6 g/dL (ref 3.5–5.0)
Alkaline Phosphatase: 164 U/L — ABNORMAL HIGH (ref 38–126)
Anion gap: 14 (ref 5–15)
BUN: 11 mg/dL (ref 6–20)
CO2: 28 mmol/L (ref 22–32)
Calcium: 8 mg/dL — ABNORMAL LOW (ref 8.9–10.3)
Chloride: 87 mmol/L — ABNORMAL LOW (ref 98–111)
Creatinine, Ser: 5.1 mg/dL — ABNORMAL HIGH (ref 0.61–1.24)
GFR calc Af Amer: 13 mL/min — ABNORMAL LOW (ref >60)
GFR calc non Af Amer: 12 mL/min — ABNORMAL LOW (ref >60)
Glucose, Bld: 94 mg/dL (ref 70–99)
Potassium: 4.3 mmol/L (ref 3.5–5.1)
Sodium: 129 mmol/L — ABNORMAL LOW (ref 135–145)
Total Bilirubin: 0.8 mg/dL (ref 0.3–1.2)
Total Protein: 6.6 g/dL (ref 6.5–8.1)

## 2018-02-13 LAB — LIPASE, BLOOD: Lipase: 28 U/L (ref 11–51)

## 2018-02-13 NOTE — ED Notes (Signed)
Pt did not respond when called for vitals in [place]

## 2018-02-13 NOTE — ED Triage Notes (Signed)
Pt BIB [facility] for c/o generalized abdominal x 3 days. Denies nausea/vomiting/diarrhea.Dialysis pt, states he had a full treatment today.

## 2018-02-13 NOTE — ED Notes (Signed)
Pt did not respond when called for vitals 2X

## 2018-02-13 NOTE — ED Provider Notes (Signed)
Patient placed in Quick Look pathway, seen and evaluated   Chief Complaint: abdominal pain.  HPI:   [name] is a 57 y.o. male with past medical history remarkable for ascites, end-stage renal on dialysis, Monday, Wednesday, Friday, who presents to the emergency department with a chief complaint of abdominal pain.  He states that he is scheduled to have dialysis tomorrow.  States that he is scheduled for a paracentesis in approximately 4 days.  States that he went to dialysis today and came to the emergency department after.  He states that he is primarily concerned with his pain.  He denies any fever, chills, nausea, vomiting.  Patient reports he got a call from someone here in the ED and told him he had bacteria in his blood and called in antibiotics. Patient states that the medication is not helping and the pain is worse. Chart review shows that patient was called 02/09/18 and Bactrim BS called in. Patient reports that he is taking on big white pill at night only.   ROS: GI: abdominal pain  Physical Exam:  BP (!) 153/74 (BP Location: Left Arm)   Pulse (!) 56   Temp 97.8 F (36.6 C) (Oral)   Resp 16   SpO2 98%    Gen: No distress  Neuro: Awake and Alert  Skin: Warm and dry  Abdomen: firm, unable to reproduce the pain the patient has had.  Initiation of care has begun. The patient has been counseled on the process, plan, and necessity for staying for the completion/evaluation, and the remainder of the medical screening examination    Ashley Murrain, NP 02/13/18 Railroad, Ankit, MD 02/24/18 1049

## 2018-02-14 ENCOUNTER — Other Ambulatory Visit: Payer: Self-pay

## 2018-02-14 ENCOUNTER — Ambulatory Visit
Admission: RE | Admit: 2018-02-14 | Discharge: 2018-02-14 | Disposition: A | Discharge status: Home / Self Care | Payer: Medicare Other | Source: Ambulatory Visit | Attending: Internal Medicine | Admitting: Internal Medicine

## 2018-02-14 ENCOUNTER — Inpatient Hospital Stay (HOSPITAL_COMMUNITY)
Admission: EM | Admit: 2018-02-14 | Discharge: 2018-02-15 | DRG: 189 | Discharge status: Against Medical Advice | Payer: Medicare Other | Attending: Internal Medicine | Admitting: Internal Medicine

## 2018-02-14 ENCOUNTER — Emergency Department (HOSPITAL_COMMUNITY): Payer: Medicare Other

## 2018-02-14 ENCOUNTER — Encounter: Payer: Self-pay | Admitting: Radiology

## 2018-02-14 DIAGNOSIS — N2581 Secondary hyperparathyroidism of renal origin: Secondary | ICD-10-CM | POA: Diagnosis present

## 2018-02-14 DIAGNOSIS — I2510 Atherosclerotic heart disease of native coronary artery without angina pectoris: Secondary | ICD-10-CM | POA: Diagnosis not present

## 2018-02-14 DIAGNOSIS — I700 Atherosclerosis of aorta: Secondary | ICD-10-CM | POA: Diagnosis present

## 2018-02-14 DIAGNOSIS — K7460 Unspecified cirrhosis of liver: Secondary | ICD-10-CM | POA: Diagnosis present

## 2018-02-14 DIAGNOSIS — I447 Left bundle-branch block, unspecified: Secondary | ICD-10-CM | POA: Diagnosis present

## 2018-02-14 DIAGNOSIS — Z86010 Personal history of colonic polyps: Secondary | ICD-10-CM | POA: Diagnosis not present

## 2018-02-14 DIAGNOSIS — E871 Hypo-osmolality and hyponatremia: Secondary | ICD-10-CM | POA: Diagnosis present

## 2018-02-14 DIAGNOSIS — J42 Unspecified chronic bronchitis: Secondary | ICD-10-CM | POA: Diagnosis not present

## 2018-02-14 DIAGNOSIS — N186 End stage renal disease: Secondary | ICD-10-CM

## 2018-02-14 DIAGNOSIS — Z8249 Family history of ischemic heart disease and other diseases of the circulatory system: Secondary | ICD-10-CM | POA: Diagnosis not present

## 2018-02-14 DIAGNOSIS — K7031 Alcoholic cirrhosis of liver with ascites: Secondary | ICD-10-CM

## 2018-02-14 DIAGNOSIS — Z87442 Personal history of urinary calculi: Secondary | ICD-10-CM

## 2018-02-14 DIAGNOSIS — N189 Chronic kidney disease, unspecified: Secondary | ICD-10-CM

## 2018-02-14 DIAGNOSIS — M19012 Primary osteoarthritis, left shoulder: Secondary | ICD-10-CM | POA: Diagnosis present

## 2018-02-14 DIAGNOSIS — J9621 Acute and chronic respiratory failure with hypoxia: Secondary | ICD-10-CM | POA: Diagnosis present

## 2018-02-14 DIAGNOSIS — F1010 Alcohol abuse, uncomplicated: Secondary | ICD-10-CM | POA: Diagnosis present

## 2018-02-14 DIAGNOSIS — K219 Gastro-esophageal reflux disease without esophagitis: Secondary | ICD-10-CM | POA: Diagnosis present

## 2018-02-14 DIAGNOSIS — Z800 Family history of malignant neoplasm of digestive organs: Secondary | ICD-10-CM

## 2018-02-14 DIAGNOSIS — Z992 Dependence on renal dialysis: Secondary | ICD-10-CM | POA: Diagnosis not present

## 2018-02-14 DIAGNOSIS — R109 Unspecified abdominal pain: Secondary | ICD-10-CM | POA: Diagnosis present

## 2018-02-14 DIAGNOSIS — I252 Old myocardial infarction: Secondary | ICD-10-CM | POA: Diagnosis not present

## 2018-02-14 DIAGNOSIS — E8770 Fluid overload, unspecified: Secondary | ICD-10-CM | POA: Diagnosis not present

## 2018-02-14 DIAGNOSIS — J449 Chronic obstructive pulmonary disease, unspecified: Secondary | ICD-10-CM | POA: Diagnosis present

## 2018-02-14 DIAGNOSIS — R0689 Other abnormalities of breathing: Secondary | ICD-10-CM | POA: Diagnosis not present

## 2018-02-14 DIAGNOSIS — M898X9 Other specified disorders of bone, unspecified site: Secondary | ICD-10-CM | POA: Diagnosis present

## 2018-02-14 DIAGNOSIS — F17210 Nicotine dependence, cigarettes, uncomplicated: Secondary | ICD-10-CM | POA: Diagnosis present

## 2018-02-14 DIAGNOSIS — R0902 Hypoxemia: Secondary | ICD-10-CM | POA: Diagnosis not present

## 2018-02-14 DIAGNOSIS — Z808 Family history of malignant neoplasm of other organs or systems: Secondary | ICD-10-CM | POA: Diagnosis not present

## 2018-02-14 DIAGNOSIS — D631 Anemia in chronic kidney disease: Secondary | ICD-10-CM | POA: Diagnosis present

## 2018-02-14 DIAGNOSIS — I10 Essential (primary) hypertension: Secondary | ICD-10-CM | POA: Diagnosis present

## 2018-02-14 DIAGNOSIS — Z825 Family history of asthma and other chronic lower respiratory diseases: Secondary | ICD-10-CM | POA: Diagnosis not present

## 2018-02-14 DIAGNOSIS — I4892 Unspecified atrial flutter: Secondary | ICD-10-CM | POA: Diagnosis present

## 2018-02-14 DIAGNOSIS — I160 Hypertensive urgency: Secondary | ICD-10-CM | POA: Diagnosis not present

## 2018-02-14 DIAGNOSIS — R1084 Generalized abdominal pain: Secondary | ICD-10-CM | POA: Diagnosis not present

## 2018-02-14 DIAGNOSIS — I120 Hypertensive chronic kidney disease with stage 5 chronic kidney disease or end stage renal disease: Secondary | ICD-10-CM | POA: Diagnosis present

## 2018-02-14 DIAGNOSIS — J811 Chronic pulmonary edema: Secondary | ICD-10-CM | POA: Diagnosis not present

## 2018-02-14 DIAGNOSIS — W19XXXA Unspecified fall, initial encounter: Secondary | ICD-10-CM | POA: Diagnosis not present

## 2018-02-14 DIAGNOSIS — Z801 Family history of malignant neoplasm of trachea, bronchus and lung: Secondary | ICD-10-CM | POA: Diagnosis not present

## 2018-02-14 DIAGNOSIS — I4891 Unspecified atrial fibrillation: Secondary | ICD-10-CM | POA: Diagnosis present

## 2018-02-14 DIAGNOSIS — R0602 Shortness of breath: Secondary | ICD-10-CM | POA: Diagnosis not present

## 2018-02-14 DIAGNOSIS — R0603 Acute respiratory distress: Secondary | ICD-10-CM | POA: Diagnosis not present

## 2018-02-14 DIAGNOSIS — R799 Abnormal finding of blood chemistry, unspecified: Secondary | ICD-10-CM | POA: Diagnosis not present

## 2018-02-14 DIAGNOSIS — R000 Tachycardia, unspecified: Secondary | ICD-10-CM | POA: Diagnosis not present

## 2018-02-14 DIAGNOSIS — Z886 Allergy status to analgesic agent status: Secondary | ICD-10-CM

## 2018-02-14 DIAGNOSIS — Z888 Allergy status to other drugs, medicaments and biological substances status: Secondary | ICD-10-CM

## 2018-02-14 DIAGNOSIS — R188 Other ascites: Secondary | ICD-10-CM | POA: Diagnosis present

## 2018-02-14 DIAGNOSIS — Z885 Allergy status to narcotic agent status: Secondary | ICD-10-CM

## 2018-02-14 HISTORY — PX: IR RADIOLOGIST EVAL & MGMT: IMG5224

## 2018-02-14 LAB — COMPREHENSIVE METABOLIC PANEL
ALT: 12 U/L (ref 0–44)
AST: 24 U/L (ref 15–41)
Albumin: 3.5 g/dL (ref 3.5–5.0)
Alkaline Phosphatase: 161 U/L — ABNORMAL HIGH (ref 38–126)
Anion gap: 14 (ref 5–15)
BUN: 20 mg/dL (ref 6–20)
CO2: 25 mmol/L (ref 22–32)
Calcium: 7.6 mg/dL — ABNORMAL LOW (ref 8.9–10.3)
Chloride: 88 mmol/L — ABNORMAL LOW (ref 98–111)
Creatinine, Ser: 6.62 mg/dL — ABNORMAL HIGH (ref 0.61–1.24)
GFR calc Af Amer: 10 mL/min — ABNORMAL LOW (ref >60)
GFR calc non Af Amer: 8 mL/min — ABNORMAL LOW (ref >60)
Glucose, Bld: 138 mg/dL — ABNORMAL HIGH (ref 70–99)
Potassium: 4.9 mmol/L (ref 3.5–5.1)
Sodium: 127 mmol/L — ABNORMAL LOW (ref 135–145)
Total Bilirubin: 0.9 mg/dL (ref 0.3–1.2)
Total Protein: 6.7 g/dL (ref 6.5–8.1)

## 2018-02-14 LAB — ALBUMIN, PLEURAL OR PERITONEAL FLUID: Albumin, Fluid: 2.1 g/dL

## 2018-02-14 LAB — CBC WITH DIFFERENTIAL/PLATELET
Abs Immature Granulocytes: 0.1 10*3/uL (ref 0.0–0.1)
Basophils Absolute: 0.1 10*3/uL (ref 0.0–0.1)
Basophils Relative: 1 %
Eosinophils Absolute: 0.1 10*3/uL (ref 0.0–0.7)
Eosinophils Relative: 1 %
HCT: 30.9 % — ABNORMAL LOW (ref 39.0–52.0)
Hemoglobin: 10.6 g/dL — ABNORMAL LOW (ref 13.0–17.0)
Immature Granulocytes: 1 %
Lymphocytes Relative: 4 %
Lymphs Abs: 0.5 10*3/uL — ABNORMAL LOW (ref 0.7–4.0)
MCH: 27.3 pg (ref 26.0–34.0)
MCHC: 34.3 g/dL (ref 30.0–36.0)
MCV: 79.6 fL (ref 78.0–100.0)
Monocytes Absolute: 0.6 10*3/uL (ref 0.1–1.0)
Monocytes Relative: 5 %
Neutro Abs: 11.3 10*3/uL — ABNORMAL HIGH (ref 1.7–7.7)
Neutrophils Relative %: 90 %
Platelets: 101 10*3/uL — ABNORMAL LOW (ref 150–400)
RBC: 3.88 MIL/uL — ABNORMAL LOW (ref 4.22–5.81)
RDW: 15.8 % — ABNORMAL HIGH (ref 11.5–15.5)
WBC: 12.5 10*3/uL — ABNORMAL HIGH (ref 4.0–10.5)

## 2018-02-14 LAB — I-STAT TROPONIN, ED: Troponin i, poc: 0.06 ng/mL (ref 0.00–0.08)

## 2018-02-14 LAB — I-STAT VENOUS BLOOD GAS, ED
Bicarbonate: 27.1 mmol/L (ref 20.0–28.0)
O2 Saturation: 79 %
TCO2: 29 mmol/L (ref 22–32)
pCO2, Ven: 53.3 mmHg (ref 44.0–60.0)
pH, Ven: 7.315 (ref 7.250–7.430)
pO2, Ven: 48 mmHg — ABNORMAL HIGH (ref 32.0–45.0)

## 2018-02-14 LAB — PROTEIN, PLEURAL OR PERITONEAL FLUID: Total protein, fluid: 3.6 g/dL

## 2018-02-14 LAB — LACTATE DEHYDROGENASE, PLEURAL OR PERITONEAL FLUID: LD, Fluid: 83 U/L — ABNORMAL HIGH (ref 3–23)

## 2018-02-14 LAB — GLUCOSE, PLEURAL OR PERITONEAL FLUID: Glucose, Fluid: 121 mg/dL

## 2018-02-14 LAB — I-STAT CG4 LACTIC ACID, ED: Lactic Acid, Venous: 2.77 mmol/L (ref 0.5–1.9)

## 2018-02-14 MED ORDER — GABAPENTIN 100 MG PO CAPS
100.0000 mg | ORAL_CAPSULE | Freq: Two times a day (BID) | ORAL | Status: DC
  Administered 2018-02-15: 100 mg via ORAL
  Filled 2018-02-14: qty 1

## 2018-02-14 MED ORDER — NITROGLYCERIN 2 % TD OINT
1.0000 [in_us] | TOPICAL_OINTMENT | Freq: Once | TRANSDERMAL | Status: AC
  Administered 2018-02-14: 1 [in_us] via TOPICAL

## 2018-02-14 MED ORDER — ONDANSETRON HCL 4 MG PO TABS: 4.0000 mg | ORAL_TABLET | Freq: Four times a day (QID) | ORAL | Status: DC | PRN

## 2018-02-14 MED ORDER — SEVELAMER CARBONATE 800 MG PO TABS
3200.0000 mg | ORAL_TABLET | Freq: Three times a day (TID) | ORAL | Status: DC
  Administered 2018-02-15 (×2): 3200 mg via ORAL
  Filled 2018-02-14 (×2): qty 4

## 2018-02-14 MED ORDER — HYDRALAZINE HCL 25 MG PO TABS
100.0000 mg | ORAL_TABLET | Freq: Two times a day (BID) | ORAL | Status: DC
  Administered 2018-02-15: 100 mg via ORAL
  Filled 2018-02-14: qty 4

## 2018-02-14 MED ORDER — TRAZODONE HCL 150 MG PO TABS: 150.0000 mg | ORAL_TABLET | Freq: Every evening | ORAL | Status: DC | PRN

## 2018-02-14 MED ORDER — HYDRALAZINE HCL 20 MG/ML IJ SOLN: 5.0000 mg | INTRAMUSCULAR | Status: DC | PRN

## 2018-02-14 MED ORDER — NICOTINE 21 MG/24HR TD PT24
21.0000 mg | MEDICATED_PATCH | Freq: Every day | TRANSDERMAL | Status: DC
  Filled 2018-02-14: qty 1

## 2018-02-14 MED ORDER — ONDANSETRON HCL 4 MG/2ML IJ SOLN: 4.0000 mg | Freq: Four times a day (QID) | INTRAMUSCULAR | Status: DC | PRN

## 2018-02-14 MED ORDER — LIDOCAINE-EPINEPHRINE 1 %-1:100000 IJ SOLN
10.0000 mL | Freq: Once | INTRAMUSCULAR | Status: AC
  Administered 2018-02-14: 10 mL
  Filled 2018-02-14: qty 10

## 2018-02-14 MED ORDER — LACTULOSE 10 GM/15ML PO SOLN
10.0000 g | Freq: Two times a day (BID) | ORAL | Status: DC
  Administered 2018-02-15: 10 g via ORAL
  Filled 2018-02-14: qty 15

## 2018-02-14 MED ORDER — NITROGLYCERIN IN D5W 200-5 MCG/ML-% IV SOLN
INTRAVENOUS | Status: AC
  Filled 2018-02-14: qty 250

## 2018-02-14 MED ORDER — ZOLPIDEM TARTRATE 5 MG PO TABS: 5.0000 mg | ORAL_TABLET | Freq: Every evening | ORAL | Status: DC | PRN

## 2018-02-14 MED ORDER — DILTIAZEM HCL ER COATED BEADS 120 MG PO CP24
120.0000 mg | ORAL_CAPSULE | Freq: Every day | ORAL | Status: DC
  Administered 2018-02-15: 120 mg via ORAL
  Filled 2018-02-14: qty 1

## 2018-02-14 MED ORDER — OXYCODONE HCL 5 MG PO TABS
5.0000 mg | ORAL_TABLET | Freq: Two times a day (BID) | ORAL | Status: DC | PRN
  Administered 2018-02-15 (×2): 5 mg via ORAL
  Filled 2018-02-14 (×2): qty 1

## 2018-02-14 MED ORDER — ALBUTEROL SULFATE (2.5 MG/3ML) 0.083% IN NEBU: 2.5000 mg | INHALATION_SOLUTION | RESPIRATORY_TRACT | Status: DC | PRN

## 2018-02-14 NOTE — ED Notes (Signed)
Attempted to call report

## 2018-02-14 NOTE — H&P (Signed)
History and Physical    [name] MVH:[id number] DOB: January 24, 1961 DOA: 02/14/2018  Referring MD/NP/PA:   PCP: [name], MD   Patient coming from:  The patient is coming from home.  At baseline, pt is independent for most of ADL.    Chief Complaint: SOB and abdominal pain   HPI: [name] is a 57 y.o. male with medical history significant of alcohol abuse in remission,  HBV, HCV, cirrhosis with ascites, SBP, hypertension, COPD, GERD, anxiety, anemia, polysubstance abuse (tobacco, cocaine and marijuana), CAD, ESRD-HD (MWF), A fib/A flutter not on AC, who presents with abdominal pain and SOB.  Patient states that he has worsening shortness of breath today.  Patient was found to have acute respiratory distress, and could not talk in full sentences in ED.  He has cough with clear mucus production.  Denies chest pain, fever or chills. Bp 208/90, which improved to 166/108 after treated with nitroglycerin patch in ED. Pt has abdominal pain, which is located in the central abdomen, constant, sharp, 10 out of 10 severity, nonradiating. Pt states that he is doctor called him and informed him that he has positive ascetis fluid culture yesterday. He was started with bactrim. Chart review showed peritoneal fluid culture was positive for Staphylococcus species (coagulase-negative) on 02/07/2018.  Patient states that he has nausea, and vomited once. He has loose stool which he attributes to lactulose use. He states that he had dialysis yesterday as usual and has not missed sessions  ED Course: pt was found to have WBC 12.5, lactic acid 2.77, potassium 4.9, bicarbonate 25, creatinine 6.62, BUN 20, sodium 127, temperature normal, has tachycardia and tachypnea alternatively, oxygen saturation 94% on 4 L nasal cannula oxygen, chest x-ray with cardiomegaly and interstitial pulmonary edema.  Patient is admitted to stepdown bed as inpatient. Renal, Dr. Florene Glen was consulted.  Diagnostic  paracentesis was performed by EDP.  Fluid analysis pending.  Review of Systems:   General: no fevers, chills, no body weight gain, has poor appetite, has fatigue HEENT: no blurry vision, hearing changes or sore throat Respiratory: has dyspnea, coughing, no wheezing CV: no chest pain, no palpitations GI:  nausea, vomiting, abdominal pain, no diarrhea, constipation GU: no dysuria, burning on urination, increased urinary frequency, hematuria  Ext: no leg edema Neuro: no unilateral weakness, numbness, or tingling, no vision change or hearing loss Skin: no rash, no skin tear. MSK: No muscle spasm, no deformity, no limitation of range of movement in spin Heme: No easy bruising.  Travel history: No recent long distant travel.  Allergy:  Allergies  Allergen Reactions  . Morphine And Related Other (See Comments)    Stomach pain  . Aspirin Other (See Comments)    STOMACH PAIN  . Clonidine Derivatives Itching  . Tramadol Itching  . Tylenol [Acetaminophen] Nausea Only    Stomach ache    Past Medical History:  Diagnosis Date  . Anemia   . Anxiety   . Arthritis    left shoulder  . Atherosclerosis of aorta ([place])   . Cardiomegaly   . Chest pain    DATE UNKNOWN, C/O PERIODICALLY  . Cocaine abuse ([place])   . COPD exacerbation ([place]) 08/17/2016  . Coronary artery disease    stent 02/22/17  . ESRD (end stage renal disease) on dialysis ([place])    "[name]; MWF" (07/04/2017)  . GERD (gastroesophageal reflux disease)    DATE UNKNOWN  . Hemorrhoids   . Hepatitis B, chronic ([place])   . Hepatitis  C   . History of kidney stones   . Hyperkalemia   . Hypertension   . Metabolic bone disease    Patient denies  . Mitral stenosis   . Myocardial infarction ([place])   . Pneumonia   . Pulmonary edema   . Solitary rectal ulcer syndrome 07/2017   at flex sig for rectal bleeding  . Tubular adenoma of colon     Past Surgical History:  Procedure Laterality Date  . A/V FISTULAGRAM Left 05/26/2017    Procedure: A/V FISTULAGRAM;  Surgeon: Conrad [place], MD;  Location: [place] [facility];  Service: Cardiovascular;  Laterality: Left;  . A/V FISTULAGRAM Right 11/18/2017   Procedure: A/V FISTULAGRAM - Right Arm;  Surgeon: Elam Dutch, MD;  Location: [place] [facility];  Service: Cardiovascular;  Laterality: Right;  . APPLICATION OF WOUND VAC Left 06/14/2017   Procedure: APPLICATION OF WOUND VAC;  Surgeon: Katha Cabal, MD;  Location: [facility];  Service: Vascular;  Laterality: Left;  . AV FISTULA PLACEMENT  2012   BELIEVED WAS PLACED IN JUNE  . AV FISTULA PLACEMENT Right 08/09/2017   Procedure: Creation Right arm ARTERIOVENOUS BRACHIOCEPOHALIC FISTULA;  Surgeon: Elam Dutch, MD;  Location: [facility];  Service: Vascular;  Laterality: Right;  . AV FISTULA PLACEMENT Right 11/22/2017   Procedure: INSERTION OF ARTERIOVENOUS (AV) GORE-TEX GRAFT RIGHT UPPER ARM;  Surgeon: Elam Dutch, MD;  Location: [place];  Service: Vascular;  Laterality: Right;  . BIOPSY  01/25/2018   Procedure: BIOPSY;  Surgeon: Jerene Bears, MD;  Location: [facility]COPY;  Service: Gastroenterology;;  . COLONOSCOPY    . COLONOSCOPY WITH PROPOFOL N/A 01/25/2018   Procedure: COLONOSCOPY WITH PROPOFOL;  Surgeon: Jerene Bears, MD;  Location: [place];  Service: Gastroenterology;  Laterality: N/A;  . CORONARY STENT INTERVENTION N/A 02/22/2017   Procedure: CORONARY STENT INTERVENTION;  Surgeon: Nigel Mormon, MD;  Location: [place] [facility];  Service: Cardiovascular;  Laterality: N/A;  . ESOPHAGOGASTRODUODENOSCOPY (EGD) WITH PROPOFOL N/A 01/25/2018   Procedure: ESOPHAGOGASTRODUODENOSCOPY (EGD) WITH PROPOFOL;  Surgeon: Jerene Bears, MD;  Location: [place];  Service: Gastroenterology;  Laterality: N/A;  . FLEXIBLE SIGMOIDOSCOPY N/A 07/15/2017   Procedure: FLEXIBLE SIGMOIDOSCOPY;  Surgeon: Carol Ada, MD;  Location: [place];  Service: Endoscopy;  Laterality: N/A;  . HEMORRHOID BANDING    . I&D EXTREMITY  Left 06/01/2017   Procedure: IRRIGATION AND DEBRIDEMENT LEFT ARM HEMATOMA WITH LIGATION OF LEFT ARM AV FISTULA;  Surgeon: Elam Dutch, MD;  Location: [place];  Service: Vascular;  Laterality: Left;  . I&D EXTREMITY Left 06/14/2017   Procedure: IRRIGATION AND DEBRIDEMENT EXTREMITY;  Surgeon: Katha Cabal, MD;  Location: [facility];  Service: Vascular;  Laterality: Left;  . INSERTION OF DIALYSIS CATHETER  05/30/2017  . INSERTION OF DIALYSIS CATHETER N/A 05/30/2017   Procedure: INSERTION OF DIALYSIS CATHETER;  Surgeon: Elam Dutch, MD;  Location: [place];  Service: Vascular;  Laterality: N/A;  . IR PARACENTESIS  08/30/2017  . IR PARACENTESIS  09/29/2017  . IR PARACENTESIS  10/28/2017  . IR PARACENTESIS  11/09/2017  . IR PARACENTESIS  11/16/2017  . IR PARACENTESIS  11/28/2017  . IR PARACENTESIS  12/01/2017  . IR PARACENTESIS  12/06/2017  . IR PARACENTESIS  01/03/2018  . IR PARACENTESIS  01/23/2018  . IR PARACENTESIS  02/07/2018  . IR RADIOLOGIST EVAL & MGMT  02/14/2018  . LEFT HEART CATH AND CORONARY ANGIOGRAPHY N/A 02/22/2017   Procedure: LEFT HEART  CATH AND CORONARY ANGIOGRAPHY;  Surgeon: Nigel Mormon, MD;  Location: [place] [facility];  Service: Cardiovascular;  Laterality: N/A;  . LIGATION OF ARTERIOVENOUS  FISTULA Left 0/08/5425   Procedure: Plication of Left Arm Arteriovenous Fistula;  Surgeon: Elam Dutch, MD;  Location: [place];  Service: Vascular;  Laterality: Left;  . POLYPECTOMY    . POLYPECTOMY  01/25/2018   Procedure: POLYPECTOMY;  Surgeon: Jerene Bears, MD;  Location: [place];  Service: Gastroenterology;;  . REVISON OF ARTERIOVENOUS FISTULA Left 0/62/3762   Procedure: PLICATION OF DISTAL ANEURYSMAL SEGEMENT OF LEFT UPPER ARM ARTERIOVENOUS FISTULA;  Surgeon: Elam Dutch, MD;  Location: [place];  Service: Vascular;  Laterality: Left;  . REVISON OF ARTERIOVENOUS FISTULA Left 03/12/5175   Procedure: Plication of Left Upper Arm Fistula ;  Surgeon: Waynetta Sandy, MD;  Location: [place];  Service: Vascular;  Laterality: Left;  . SKIN GRAFT SPLIT THICKNESS LEG / FOOT Left    SKIN GRAFT SPLIT THICKNESS LEFT ARM DONOR SITE: LEFT ANTERIOR THIGH  . SKIN SPLIT GRAFT Left 07/04/2017   Procedure: SKIN GRAFT SPLIT THICKNESS LEFT ARM DONOR SITE: LEFT ANTERIOR THIGH;  Surgeon: Elam Dutch, MD;  Location: [place];  Service: Vascular;  Laterality: Left;  . THROMBECTOMY W/ EMBOLECTOMY Left 06/05/2017   Procedure: EXPLORATION OF LEFT ARM FOR BLEEDING; OVERSEWED PROXIMAL FISTULA;  Surgeon: Angelia Mould, MD;  Location: [place];  Service: Vascular;  Laterality: Left;  . WOUND EXPLORATION Left 06/03/2017   Procedure: WOUND EXPLORATION WITH WOUND VAC APPLICATION TO LEFT ARM;  Surgeon: Angelia Mould, MD;  Location: [place];  Service: Vascular;  Laterality: Left;    Social History:  reports that he has been smoking cigarettes.  He started smoking about 44 years ago. He has a 21.50 pack-year smoking history. He has never used smokeless tobacco. He reports that he drank alcohol. He reports that he has current or past drug history. Drug: Marijuana.  Family History:  Family History  Problem Relation Age of Onset  . Heart disease Mother   . Lung cancer Mother   . Heart disease Father   . Malignant hyperthermia Father   . COPD Father   . Throat cancer Sister   . Esophageal cancer Sister   . Hypertension Other   . COPD Other   . Colon cancer Neg Hx   . Colon polyps Neg Hx   . Rectal cancer Neg Hx   . Stomach cancer Neg Hx      Prior to Admission medications   Medication Sig Start Date End Date Taking? Authorizing Provider  albuterol (VENTOLIN HFA) 108 (90 Base) MCG/ACT inhaler Inhale 2 puffs into the lungs every 6 (six) hours as needed for wheezing or shortness of breath.    [provider]  diltiazem (CARDIZEM CD) 120 MG 24 hr capsule Take 1 capsule (120 mg total) by mouth daily. 06/23/17 06/23/18  ShahmehdiValeria Batman, MD  gabapentin  (NEURONTIN) 100 MG capsule Take 100 mg by mouth 2 (two) times daily. 12/13/17   [provider]  hydrALAZINE (APRESOLINE) 100 MG tablet Take 100 mg by mouth 2 (two) times daily. Taking after HD on Dialysis days    [provider]  lactulose, encephalopathy, (CHRONULAC) 10 GM/15ML SOLN Take 15 ml's by mouth twice daily. Patient taking differently: Take 10 g by mouth 2 (two) times daily.  01/17/18   Esterwood, Amy S, PA-C  oxyCODONE (OXY IR/ROXICODONE) 5 MG immediate release tablet Take 5  mg by mouth 2 (two) times daily as needed for pain. 12/13/17   [provider]  sevelamer carbonate (RENVELA) 800 MG tablet Take 4 tablets with meals  And 2 tablets twice daily with snacks Patient taking differently: Take 1,600-3,200 mg by mouth See admin instructions. Take 4 tablets with meals  And 2 tablets twice daily with snacks 01/22/18   Patrecia Pour, MD  sulfamethoxazole-trimethoprim (BACTRIM DS,SEPTRA DS) 800-160 MG tablet Take 1 tablet by mouth 2 (two) times daily. 02/09/18   Pyrtle, Lajuan Lines, MD  traZODone (DESYREL) 150 MG tablet Take 150 mg by mouth at bedtime as needed for sleep.    [provider]    Physical Exam: Vitals:   02/15/18 0353 02/15/18 0413 02/15/18 0443 02/15/18 0505  BP: (!) 159/101 (!) 155/96 (!) 146/87 (!) 158/85  Pulse:      Resp:      Temp:      TempSrc:      SpO2: 98% 99% 100% 100%  Weight:      Height:       General: Not in acute distress HEENT:       Eyes: PERRL, EOMI, no scleral icterus.       ENT: No discharge from the ears and nose, no pharynx injection, no tonsillar enlargement.        Neck: No JVD, no bruit, no mass felt. Heme: No neck lymph node enlargement. Cardiac: S1/S2, RRR, No murmurs, No gallops or rubs. Respiratory: No rales, wheezing, rhonchi or rubs. GI: distended, has diffused tenderness, no rebound pain, no organomegaly, BS present. GU: No hematuria Ext: No pitting leg edema bilaterally. 2+DP/PT pulse  bilaterally. Musculoskeletal: No joint deformities, No joint redness or warmth, no limitation of ROM in spin. Skin: No rashes.  Neuro: Alert, oriented X3, cranial nerves II-XII grossly intact, moves all extremities normally. Psych: Patient is not psychotic, no suicidal or hemocidal ideation.  Labs on Admission: I have personally reviewed following labs and imaging studies  CBC: Recent Labs  Lab 02/13/18 1722 02/14/18 1919 02/15/18 0023  WBC 8.9 12.5* 12.1*  NEUTROABS  --  11.3*  --   HGB 11.7* 10.6* 11.6*  HCT 34.1* 30.9* 33.6*  MCV 78.8 79.6 79.1  PLT 114* 101* 505*   Basic Metabolic Panel: Recent Labs  Lab 02/13/18 1722 02/14/18 1919 02/15/18 0023  NA 129* 127* 128*  K 4.3 4.9 5.4*  CL 87* 88* 86*  CO2 28 25 25   GLUCOSE 94 138* 155*  BUN 11 20 19   CREATININE 5.10* 6.62* 6.84*  CALCIUM 8.0* 7.6* 8.4*   GFR: Estimated Creatinine Clearance: 12.2 mL/min (A) (by C-G formula based on SCr of 6.84 mg/dL (H)). Liver Function Tests: Recent Labs  Lab 02/13/18 1722 02/14/18 1919  AST 30 24  ALT 14 12  ALKPHOS 164* 161*  BILITOT 0.8 0.9  PROT 6.6 6.7  ALBUMIN 3.6 3.5   Recent Labs  Lab 02/13/18 1722  LIPASE 28   Recent Labs  Lab 02/15/18 0023  AMMONIA 28   Coagulation Profile: No results for input(s): INR, PROTIME in the last 168 hours. Cardiac Enzymes: No results for input(s): CKTOTAL, CKMB, CKMBINDEX, TROPONINI in the last 168 hours. BNP (last 3 results) No results for input(s): PROBNP in the last 8760 hours. HbA1C: No results for input(s): HGBA1C in the last 72 hours. CBG: No results for input(s): GLUCAP in the last 168 hours. Lipid Profile: No results for input(s): CHOL, HDL, LDLCALC, TRIG, CHOLHDL, LDLDIRECT in the last 72 hours.  Thyroid Function Tests: No results for input(s): TSH, T4TOTAL, FREET4, T3FREE, THYROIDAB in the last 72 hours. Anemia Panel: No results for input(s): VITAMINB12, FOLATE, FERRITIN, TIBC, IRON, RETICCTPCT in the last 72  hours. Urine analysis:    Component Value Date/Time   COLORURINE YELLOW 07/16/2011 1619   APPEARANCEUR CLEAR 07/16/2011 1619   LABSPEC 1.018 07/16/2011 1619   PHURINE 7.5 07/16/2011 1619   GLUCOSEU 250 (A) 07/16/2011 1619   HGBUR MODERATE (A) 07/16/2011 1619   BILIRUBINUR SMALL (A) 07/16/2011 1619   KETONESUR NEGATIVE 07/16/2011 1619   PROTEINUR >300 (A) 07/16/2011 1619   UROBILINOGEN 1.0 07/16/2011 1619   NITRITE NEGATIVE 07/16/2011 1619   LEUKOCYTESUR SMALL (A) 07/16/2011 1619   Sepsis Labs: @LABRCNTIP (procalcitonin:4,lacticidven:4) ) Recent Results (from the past 240 hour(s))  Culture, body fluid-bottle     Status: Abnormal (Preliminary result)   Collection Time: 02/07/18  2:48 PM  Result Value Ref Range Status   Specimen Description FLUID PLEURAL  Final   Special Requests BOTTLES DRAWN AEROBIC AND ANAEROBIC  Final   Gram Stain   Final    GRAM POSITIVE COCCI IN CLUSTERS ANAEROBIC BOTTLE ONLY CRITICAL RESULT CALLED TO, READ BACK BY AND VERIFIED WITH: DR Tyrone Nine [id number]  0936 MLM    Culture (A)  Final    STAPHYLOCOCCUS SPECIES (COAGULASE NEGATIVE) Sent to [place] for further susceptibility testing. Performed at [facility], [place] [address] [place], [place] [address]    Report Status PENDING  Incomplete  Gram stain     Status: None   Collection Time: 02/07/18  2:48 PM  Result Value Ref Range Status   Specimen Description FLUID PLEURAL  Final   Special Requests NONE  Final   Gram Stain   Final    WBC PRESENT,BOTH PMN AND MONONUCLEAR NO ORGANISMS SEEN CYTOSPIN SMEAR Performed at [facility], [address], [place], [place] [address]    Report Status 02/07/2018 FINAL  Final  Susceptibility, Aer + Anaerob     Status: None   Collection Time: 02/07/18  6:21 PM  Result Value Ref Range Status   Suscept, Aer + Anaerob Preliminary report  Final    Comment: (NOTE) Performed At: [facility] [place], [place] [id number] Rush Farmer MD  LK:[id number]    Source of Sample PLEURAL  Final    Comment: Performed at [facility], [place] [address], [place], [place] [address]  Susceptibility Result     Status: Abnormal (Preliminary result)   Collection Time: 02/07/18  6:21 PM  Result Value Ref Range Status   Suscept Result 1 Comment (A)  Final    Comment: (NOTE) Staphylococcus capitis Performed At: [facility] [place], [place] [id number] Rush Farmer MD [id number]    Antimicrobial Suscept PENDING  Incomplete  Gram stain     Status: None   Collection Time: 02/14/18 11:01 PM  Result Value Ref Range Status   Specimen Description FLUID PERITONEAL  Final   Special Requests NONE  Final   Gram Stain   Final    WBC PRESENT, PREDOMINANTLY MONONUCLEAR NO ORGANISMS SEEN CYTOSPIN SMEAR Performed at [facility], [address], [place],  [address]    Report Status 02/15/2018 FINAL  Final  MRSA PCR Screening     Status: None   Collection Time: 02/15/18  1:30 AM  Result Value Ref Range Status   MRSA by PCR NEGATIVE NEGATIVE Final    Comment:  The GeneXpert MRSA Assay (FDA approved for NASAL specimens only), is one component of a comprehensive MRSA colonization surveillance program. It is not intended to diagnose MRSA infection nor to guide or monitor treatment for MRSA infections. Performed at [facility], [place] [address] [place], [place] [address]      Radiological Exams on Admission: Dg Chest Port 1 View  Result Date: 02/14/2018 CLINICAL DATA:  Respiratory distress.  Current smoker. EXAM: PORTABLE CHEST 1 VIEW COMPARISON:  01/23/2018 FINDINGS: Cardiomegaly with interstitial edema, increased since prior comparison. Right IJ approach dialysis catheter terminates in the right atrium. Moderate aortic atherosclerosis without aneurysmal dilatation noted. No significant effusion. No pulmonary consolidation or pneumothorax. Slight elevation of the left hemidiaphragm. No  acute osseous abnormality. IMPRESSION: Cardiomegaly with mild-to-moderate interstitial pulmonary edema, increased since prior. Electronically Signed   By: Ashley Royalty M.D.   On: 02/14/2018 19:10   Ir Radiologist Eval & Mgmt  Result Date: 02/14/2018 Please refer to notes tab for details about interventional procedure. (Op Note)  EKG: Independently reviewed.  Sinus rhythm, QTC 542, LAD, poor R wave progression, QRS widening, anteroseptal infarction pattern.   Assessment/Plan Principal Problem:   Acute on chronic respiratory failure with hypoxia (HCC) Active Problems:   Alcohol abuse   Hypertensive urgency   Fluid overload   Essential hypertension   COPD (chronic obstructive pulmonary disease) (HCC)   Anemia due to chronic kidney disease   ESRD on dialysis ([place])   Abdominal pain   Hyponatremia   Acute on chronic respiratory failure with hypoxia: mostly likely due to fluid overload.  Hypertensive urgency may have contributed partially.  Patient was initially put on BiPAP and treated with nitroglycerin patch, respiratory distress improved.  Currently is off BiPAP.  Nephrology, Dr. Florene Glen was consulted for dialysis in the morning.  -Admit to stepdown bed as inpatient - PRN albuterol nebulizers -Nasal cannula oxygen to maintain oxygen saturation above 93% -HD per renal  Abdominal pain: pt had positive peritoneal fluid culture for Staphylococcus species (coagulase-negative) on 02/07/2018, not sure if it is due to conamination or not. Given severe AP and leukocytosis, will start antibiotics for possible SBP.  Diagnostic paracentesis was performed by EDP.  Pending urinalysis. -PRN oxycodone -IV Rocephin - Blood culture.  Atrial flutter: CHA2DS2-VASc Score is 2, needs oral anticoagulation, but pt is not good candidate for [facility] due high risk of bleeding 2/2 liver cirrhosis and thrombocytopenia. HR 56-120s -tele monitoring -continue caridzem  Tobacco abuse  -Nicotine patch  HTN and  hypertensive urgency: blood pressure two 8/90, which improved to 166/102 after treated with nitroglycerin patch to ED. -Continue home medications:Cardizem, hydralazine -IV hydralazine prn  COPD (chronic obstructive pulmonary disease) ([place]): no wheezing or rhonchi on auscultation. - prn albuterol nebs  Anemia due to chronic kidney disease: hgb stable.Hemoglobin 10.6 -f/u by CBC  ESRD (end stage renal disease) on dialysis (MWF):had HD on Monday. potassium 4.9, bicarbonate 25, creatinine 6.25, BUN 20. Now has fluid overload -Dr. Bjorn Pippin was consulted for HD in AM  Liver cirrhosis (HCC):Mental status normal -continue home lactulose 10 mg twice daily -check ammonia level  Hyponatremia: Na 127.  Mental status normal. - expecting correction with HD    DVT ppx: SCD Code Status: Full code Family Communication: None at bed side.     Disposition Plan:  Anticipate discharge back to previous home environment Consults called:  Renal Dr. Florene Glen Admission status:  SDU/inpation       Date of Service 02/15/2018    Ivor Costa  [facility] Pager [phone]  If 7PM-7AM, please contact night-coverage [email or web address] Password [facility] 02/15/2018, 7:31 AM

## 2018-02-14 NOTE — ED Notes (Addendum)
Pt stating he has to use bathroom for BM. Pt refused bed pan or bedside commode. Pt reminded we cannot take off mask due to work of breathing. Pt told to notify RN if we can help him with bedside commode or bed pan.

## 2018-02-14 NOTE — ED Provider Notes (Signed)
[facility] EMERGENCY DEPARTMENT Provider Note   CSN: [id number] Arrival date & time: 02/14/18  1813     History   Chief Complaint Chief Complaint  Patient presents with  . Respiratory Distress    HPI [name] is a 57 y.o. male.  57 year old male with extensive past medical history including ESRD on HD, CAD, COPD, substance abuse, cirrhosis who presents with respiratory distress.  EMS picked patient up for respiratory distress.  He told them that he had been having severe shortness of breath all day.  I noted in his chart that the patient checked into the ED yesterday however patient did not mention this.  He states that he had dialysis yesterday as usual and has not missed sessions.  He denies any chest pain.  LEVEL 5 CAVEAT DUE TO RESPIRATORY DISTRESS  The history is provided by the EMS personnel and the patient.    Past Medical History:  Diagnosis Date  . Anemia   . Anxiety   . Arthritis    left shoulder  . Atherosclerosis of aorta ([place])   . Cardiomegaly   . Chest pain    DATE UNKNOWN, C/O PERIODICALLY  . Cocaine abuse ([place])   . COPD exacerbation ([place]) 08/17/2016  . Coronary artery disease    stent 02/22/17  . ESRD (end stage renal disease) on dialysis ([place])    "[name]; MWF" (07/04/2017)  . GERD (gastroesophageal reflux disease)    DATE UNKNOWN  . Hemorrhoids   . Hepatitis B, chronic ([place])   . Hepatitis C   . History of kidney stones   . Hyperkalemia   . Hypertension   . Metabolic bone disease    Patient denies  . Mitral stenosis   . Myocardial infarction ([place])   . Pneumonia   . Pulmonary edema   . Solitary rectal ulcer syndrome 07/2017   at flex [facility] for rectal bleeding  . Tubular adenoma of colon     Patient Active Problem List   Diagnosis Date Noted  . Benign neoplasm of cecum   . Benign neoplasm of ascending colon   . Benign neoplasm of descending colon   . Benign neoplasm of rectum   . Atrial fibrillation ([place])  01/23/2018  . Hx of colonic polyps 01/20/2018  . ESRD (end stage renal disease) ([place]) 11/21/2017  . GERD (gastroesophageal reflux disease) 11/16/2017  . Liver cirrhosis ([place]) 11/15/2017  . DNR (do not resuscitate)   . Palliative care by specialist   . Hyponatremia 11/04/2017  . SBP (spontaneous bacterial peritonitis) ([place]) 10/30/2017  . Liver disease, chronic 10/30/2017  . SOB (shortness of breath)   . Abdominal pain 10/28/2017  . Upper airway cough syndrome with flattening on f/v loop 10/13/17 c/w vcd 10/17/2017  . Elevated diaphragm 10/13/2017  . Ileus ([place]) 09/29/2017  . Malnutrition of moderate degree 09/29/2017  . Sinus congestion 09/03/2017  . Symptomatic anemia 09/02/2017  . Other cirrhosis of liver ([place]) 09/02/2017  . Left bundle branch block 09/02/2017  . Mitral stenosis 09/02/2017  . Hematochezia 07/15/2017  . Wide-complex tachycardia ([place])   . Endotracheally intubated   . ESRD on dialysis ([place]) 07/04/2017  . Acute respiratory failure with hypoxia ([place]) 06/18/2017  . CKD (chronic kidney disease) stage V requiring chronic dialysis ([place]) 06/18/2017  . History of Cocaine abuse ([place]) 06/18/2017  . Hypertension 06/18/2017  . Infection of AV graft for dialysis ([place]) 06/18/2017  . Anxiety 06/18/2017  . Anemia due to chronic kidney disease 06/18/2017  .  Atrial flutter with rapid ventricular response ([place]) 06/18/2017  . Personality disorder ([place]) 06/13/2017  . Cellulitis 06/12/2017  . Adjustment disorder with mixed anxiety and depressed mood 06/10/2017  . Suicidal ideation 06/10/2017  . Arm wound, left, sequela 06/10/2017  . Dyspnea on exertion 05/29/2017  . Tachycardia 05/29/2017  . Hyperkalemia 05/22/2017  . Acute metabolic encephalopathy   . Anemia 04/23/2017  . Ascites 04/23/2017  . COPD (chronic obstructive pulmonary disease) ([place]) 04/23/2017  . Acute on chronic respiratory failure with hypoxia ([place]) 03/25/2017  . Arrhythmia 03/25/2017  . COPD GOLD 0 with flattening  on inps f/v  09/27/2016  . Essential hypertension 09/27/2016  . Fluid overload 08/30/2016  . COPD exacerbation ([place]) 08/17/2016  . Hypertensive urgency 08/17/2016  . Respiratory failure ([place]) 08/17/2016  . Problem with dialysis access ([place]) 07/23/2016  . Chronic hepatitis B ([place]) 03/05/2014  . Chronic hepatitis C without hepatic coma ([place]) 03/05/2014  . Internal hemorrhoids with bleeding, swelling and itching 03/05/2014  . Thrombocytopenia ([place]) 03/05/2014  . Chest pain 02/27/2014  . Alcohol abuse 04/14/2009  . Cigarette smoker 04/14/2009  . GANGLION CYST 04/14/2009    Past Surgical History:  Procedure Laterality Date  . A/V FISTULAGRAM Left 05/26/2017   Procedure: A/V FISTULAGRAM;  Surgeon: Conrad Kekaha, MD;  Location: [facility];  Service: Cardiovascular;  Laterality: Left;  . A/V FISTULAGRAM Right 11/18/2017   Procedure: A/V FISTULAGRAM - Right Arm;  Surgeon: Elam Dutch, MD;  Location: [facility] LAB;  Service: Cardiovascular;  Laterality: Right;  . APPLICATION OF WOUND VAC Left 06/14/2017   Procedure: APPLICATION OF WOUND VAC;  Surgeon: Katha Cabal, MD;  Location: [facility] ORS;  Service: Vascular;  Laterality: Left;  . AV FISTULA PLACEMENT  2012   BELIEVED WAS PLACED IN JUNE  . AV FISTULA PLACEMENT Right 08/09/2017   Procedure: Creation Right arm ARTERIOVENOUS BRACHIOCEPOHALIC FISTULA;  Surgeon: Elam Dutch, MD;  Location[facility];  Service: Vascular;  Laterality: Right;  . AV FISTULA PLACEMENT Right 11/22/2017   Procedure: INSERTION OF ARTERIOVENOUS (AV) GORE-TEX GRAFT RIGHT UPPER ARM;  Surgeon: Elam Dutch, MD;  Location: [facility];  Service: Vascular;  Laterality: Right;  . BIOPSY  01/25/2018   Procedure: BIOPSY;  Surgeon: Jerene Bears, MD;  Location: [facility] ENDOSCOPY;  Service: Gastroenterology;;  . COLONOSCOPY    . COLONOSCOPY WITH PROPOFOL N/A 01/25/2018   Procedure: COLONOSCOPY WITH PROPOFOL;  Surgeon: Jerene Bears, MD;  Location: [facility];   Service: Gastroenterology;  Laterality: N/A;  . CORONARY STENT INTERVENTION N/A 02/22/2017   Procedure: CORONARY STENT INTERVENTION;  Surgeon: Nigel Mormon, MD;  Location: [facility] CV LAB;  Service: Cardiovascular;  Laterality: N/A;  . ESOPHAGOGASTRODUODENOSCOPY (EGD) WITH PROPOFOL N/A 01/25/2018   Procedure: ESOPHAGOGASTRODUODENOSCOPY (EGD) WITH PROPOFOL;  Surgeon: Jerene Bears, MD;  Location: [facility];  Service: Gastroenterology;  Laterality: N/A;  . FLEXIBLE SIGMOIDOSCOPY N/A 07/15/2017   Procedure: FLEXIBLE SIGMOIDOSCOPY;  Surgeon: Carol Ada, MD;  Location: [facility];  Service: Endoscopy;  Laterality: N/A;  . HEMORRHOID BANDING    . I&D EXTREMITY Left 06/01/2017   Procedure: IRRIGATION AND DEBRIDEMENT LEFT ARM HEMATOMA WITH LIGATION OF LEFT ARM AV FISTULA;  Surgeon: Elam Dutch, MD;  Location: [facility];  Service: Vascular;  Laterality: Left;  . I&D EXTREMITY Left 06/14/2017   Procedure: IRRIGATION AND DEBRIDEMENT EXTREMITY;  Surgeon: Katha Cabal, MD;  Location: [facility] ORS;  Service: Vascular;  Laterality: Left;  . INSERTION OF DIALYSIS CATHETER  05/30/2017  . INSERTION OF DIALYSIS CATHETER N/A 05/30/2017   Procedure: INSERTION OF DIALYSIS CATHETER;  Surgeon: Elam Dutch, MD;  Location: [facility];  Service: Vascular;  Laterality: N/A;  . IR PARACENTESIS  08/30/2017  . IR PARACENTESIS  09/29/2017  . IR PARACENTESIS  10/28/2017  . IR PARACENTESIS  11/09/2017  . IR PARACENTESIS  11/16/2017  . IR PARACENTESIS  11/28/2017  . IR PARACENTESIS  12/01/2017  . IR PARACENTESIS  12/06/2017  . IR PARACENTESIS  01/03/2018  . IR PARACENTESIS  01/23/2018  . IR PARACENTESIS  02/07/2018  . IR RADIOLOGIST EVAL & MGMT  02/14/2018  . LEFT HEART CATH AND CORONARY ANGIOGRAPHY N/A 02/22/2017   Procedure: LEFT HEART CATH AND CORONARY ANGIOGRAPHY;  Surgeon: Nigel Mormon, MD;  Location: [facility] CV LAB;  Service: Cardiovascular;  Laterality: N/A;  . LIGATION OF ARTERIOVENOUS  FISTULA Left  0/0/9233   Procedure: Plication of Left Arm Arteriovenous Fistula;  Surgeon: Elam Dutch, MD;  Location: [facility];  Service: Vascular;  Laterality: Left;  . POLYPECTOMY    . POLYPECTOMY  01/25/2018   Procedure: POLYPECTOMY;  Surgeon: Jerene Bears, MD;  Location: [facility];  Service: Gastroenterology;;  . REVISON OF ARTERIOVENOUS FISTULA Left 0/01/6225   Procedure: PLICATION OF DISTAL ANEURYSMAL SEGEMENT OF LEFT UPPER ARM ARTERIOVENOUS FISTULA;  Surgeon: Elam Dutch, MD;  Location: [facility];  Service: Vascular;  Laterality: Left;  . REVISON OF ARTERIOVENOUS FISTULA Left 3/33/5456   Procedure: Plication of Left Upper Arm Fistula ;  Surgeon: Waynetta Sandy, MD;  Location: [facility];  Service: Vascular;  Laterality: Left;  . SKIN GRAFT SPLIT THICKNESS LEG / FOOT Left    SKIN GRAFT SPLIT THICKNESS LEFT ARM DONOR SITE: LEFT ANTERIOR THIGH  . SKIN SPLIT GRAFT Left 07/04/2017   Procedure: SKIN GRAFT SPLIT THICKNESS LEFT ARM DONOR SITE: LEFT ANTERIOR THIGH;  Surgeon: Elam Dutch, MD;  Location: [facility];  Service: Vascular;  Laterality: Left;  . THROMBECTOMY W/ EMBOLECTOMY Left 06/05/2017   Procedure: EXPLORATION OF LEFT ARM FOR BLEEDING; OVERSEWED PROXIMAL FISTULA;  Surgeon: Angelia Mould, MD;  Location: [facility];  Service: Vascular;  Laterality: Left;  . WOUND EXPLORATION Left 06/03/2017   Procedure: WOUND EXPLORATION WITH WOUND VAC APPLICATION TO LEFT ARM;  Surgeon: Angelia Mould, MD;  Location: [facility];  Service: Vascular;  Laterality: Left;        Home Medications    Prior to Admission medications   Medication Sig Start Date End Date Taking? Authorizing Provider  albuterol (VENTOLIN HFA) 108 (90 Base) MCG/ACT inhaler Inhale 2 puffs into the lungs every 6 (six) hours as needed for wheezing or shortness of breath.   Yes [provider]  diltiazem (CARDIZEM CD) 120 MG 24 hr capsule Take 1 capsule (120 mg total) by mouth daily. 06/23/17 06/23/18 Yes Shahmehdi,  Seyed A, MD  gabapentin (NEURONTIN) 100 MG capsule Take 100 mg by mouth 2 (two) times daily. 12/13/17  Yes [provider]  hydrALAZINE (APRESOLINE) 100 MG tablet Take 100 mg by mouth 2 (two) times daily. Taking after HD on Dialysis days   Yes [provider]  lactulose, encephalopathy, (CHRONULAC) 10 GM/15ML SOLN Take 15 ml's by mouth twice daily. Patient taking differently: Take 10 g by mouth 2 (two) times daily.  01/17/18  Yes Esterwood, Amy S, PA-C  oxyCODONE (OXY IR/ROXICODONE) 5 MG immediate release tablet Take 5 mg by mouth 2 (two) times daily as needed for pain. 12/13/17  Yes [provider]  sevelamer carbonate (RENVELA) 800 MG tablet Take 4 tablets with meals  And 2 tablets twice daily with snacks Patient taking differently: Take 1,600-3,200 mg by mouth See admin instructions. Take 4 tablets with meals  And 2 tablets twice daily with snacks 01/22/18  Yes Patrecia Pour, MD  sulfamethoxazole-trimethoprim (BACTRIM DS,SEPTRA DS) 800-160 MG tablet Take 1 tablet by mouth 2 (two) times daily. 02/09/18  Yes Pyrtle, Lajuan Lines, MD  traZODone (DESYREL) 150 MG tablet Take 150 mg by mouth at bedtime as needed for sleep.   Yes [provider]    Family History Family History  Problem Relation Age of Onset  . Heart disease Mother   . Lung cancer Mother   . Heart disease Father   . Malignant hyperthermia Father   . COPD Father   . Throat cancer Sister   . Esophageal cancer Sister   . Hypertension Other   . COPD Other   . Colon cancer Neg Hx   . Colon polyps Neg Hx   . Rectal cancer Neg Hx   . Stomach cancer Neg Hx     Social History Social History   Tobacco Use  . Smoking status: Current Every Day Smoker    Packs/day: 0.50    Years: 43.00    Pack years: 21.50    Types: Cigarettes    Start date: 08/13/1973  . Smokeless tobacco: Never Used  Substance Use Topics  . Alcohol use: Not Currently    Frequency: Never    Comment: quit drinking in 2017  . Drug use:  Yes    Types: Marijuana    Comment: quit in 2017"     Allergies   Morphine and related; Aspirin; Clonidine derivatives; Tramadol; and Tylenol [acetaminophen]   Review of Systems Review of Systems  Unable to perform ROS: Severe respiratory distress     Physical Exam Updated Vital Signs BP (!) 150/91   Pulse 99   Temp 98 F (36.7 C) (Axillary)   Resp 13   SpO2 100%   Physical Exam  Constitutional: He is oriented to person, place, and time. He appears well-developed and well-nourished. He appears distressed.  HENT:  Head: Normocephalic and atraumatic.  Moist mucous membranes  Eyes: Conjunctivae are normal.  Neck: Neck supple. JVD present.  Cardiovascular: Normal rate and regular rhythm.  Murmur heard. Pulmonary/Chest: He is in respiratory distress.  Severely dyspneic, unable to speak, accessory muscle use, crackles b/l  Abdominal: Soft. Bowel sounds are normal. He exhibits distension. There is no tenderness.  Moderate to large amount of ascites  Musculoskeletal: He exhibits no edema.  B/l UE AV fistulas  Neurological: He is alert and oriented to person, place, and time.  Fluent speech  Skin: Skin is warm. He is diaphoretic.  Vascath in R upper chest  Psychiatric:  anxious  Nursing note and vitals reviewed.    ED Treatments / Results  Labs (all labs ordered are listed, but only abnormal results are displayed) Labs Reviewed  COMPREHENSIVE METABOLIC PANEL - Abnormal; Notable for the following components:      Result Value   Sodium 127 (*)    Chloride 88 (*)    Glucose, Bld 138 (*)    Creatinine, Ser 6.62 (*)    Calcium 7.6 (*)    Alkaline Phosphatase 161 (*)    GFR calc non Af Amer 8 (*)    GFR calc Af Amer 10 (*)    All other components within normal limits  CBC WITH DIFFERENTIAL/PLATELET - Abnormal; Notable for the following components:   WBC 12.5 (*)    RBC 3.88 (*)    Hemoglobin 10.6 (*)    HCT 30.9 (*)    RDW 15.8 (*)    Platelets 101 (*)     Neutro Abs 11.3 (*)    Lymphs Abs 0.5 (*)    All other components within normal limits  LACTATE DEHYDROGENASE, PLEURAL OR PERITONEAL FLUID - Abnormal; Notable for the following components:   LD, Fluid 83 (*)    All other components within normal limits  BODY FLUID CELL COUNT WITH DIFFERENTIAL - Abnormal; Notable for the following components:   Appearance, Fluid HAZY (*)    All other components within normal limits  I-STAT CG4 LACTIC ACID, ED - Abnormal; Notable for the following components:   Lactic Acid, Venous 2.77 (*)    All other components within normal limits  I-STAT VENOUS BLOOD GAS, ED - Abnormal; Notable for the following components:   pO2, Ven 48.0 (*)    All other components within normal limits  GRAM STAIN  CULTURE, BLOOD (ROUTINE X 2)  CULTURE, BLOOD (ROUTINE X 2)  CULTURE, BODY FLUID-BOTTLE  GLUCOSE, PLEURAL OR PERITONEAL FLUID  PROTEIN, PLEURAL OR PERITONEAL FLUID  ALBUMIN, PLEURAL OR PERITONEAL FLUID  AMMONIA  BASIC METABOLIC PANEL  CBC  I-STAT TROPONIN, ED  I-STAT CG4 LACTIC ACID, ED    EKG EKG Interpretation  Date/Time:  Tuesday February 14 2018 18:19:49 EDT Ventricular Rate:  112 PR Interval:    QRS Duration: 184 QT Interval:  397 QTC Calculation: 542 R Axis:   -77 Text Interpretation:  Atrial flutter with predominant 2:1 AV block Nonspecific IVCD with LAD LVH with secondary repolarization abnormality Inferior infarct, acute (RCA) Anterior Q waves, possibly due to LVH Probable RV involvement, suggest recording right precordial leads peaked T waves and tachycardia new from previous Confirmed by Theotis Burrow [phone]) on 02/14/2018 8:26:33 PM   Radiology Dg Chest Port 1 View  Result Date: 02/14/2018 CLINICAL DATA:  Respiratory distress.  Current smoker. EXAM: PORTABLE CHEST 1 VIEW COMPARISON:  01/23/2018 FINDINGS: Cardiomegaly with interstitial edema, increased since prior comparison. Right IJ approach dialysis catheter terminates in the right atrium.  Moderate aortic atherosclerosis without aneurysmal dilatation noted. No significant effusion. No pulmonary consolidation or pneumothorax. Slight elevation of the left hemidiaphragm. No acute osseous abnormality. IMPRESSION: Cardiomegaly with mild-to-moderate interstitial pulmonary edema, increased since prior. Electronically Signed   By: Ashley Royalty M.D.   On: 02/14/2018 19:10   Ir Radiologist Eval & Mgmt  Result Date: 02/14/2018 Please refer to notes tab for details about interventional procedure. (Op Note)   Procedures .Paracentesis Date/Time: 02/15/2018 12:16 AM Performed by: Sharlett Iles, MD Authorized by: Sharlett Iles, MD   Consent:    Consent obtained:  Verbal   Consent given by:  Patient   Risks discussed:  Bleeding, bowel perforation, infection and pain Pre-procedure details:    Procedure purpose:  Diagnostic   Preparation: Patient was prepped and draped in usual sterile fashion   Anesthesia (see MAR for exact dosages):    Anesthesia method:  Local infiltration   Local anesthetic:  Lidocaine 1% WITH epi Procedure details:    Needle gauge:  18   Puncture site:  R lower quadrant   Fluid removed amount:  75 ml   Fluid appearance:  Yellow   Dressing:  4x4 sterile gauze Post-procedure details:    Patient tolerance of procedure:  Tolerated well, no immediate complications .  Critical Care Performed by: Sharlett Iles, MD Authorized by: Sharlett Iles, MD   Critical care provider statement:    Critical care time (minutes):  45   Critical care time was exclusive of:  Separately billable procedures and treating other patients   Critical care was necessary to treat or prevent imminent or life-threatening deterioration of the following conditions:  Respiratory failure   Critical care was time spent personally by me on the following activities:  Development of treatment plan with patient or surrogate, discussions with consultants, evaluation of patient's  response to treatment, examination of patient, obtaining history from patient or surrogate, ordering and performing treatments and interventions, ordering and review of laboratory studies, ordering and review of radiographic studies, re-evaluation of patient's condition and review of old charts   (including critical care time)  Medications Ordered in ED Medications  diltiazem (CARDIZEM CD) 24 hr capsule 120 mg (has no administration in time range)  gabapentin (NEURONTIN) capsule 100 mg (has no administration in time range)  hydrALAZINE (APRESOLINE) tablet 100 mg (has no administration in time range)  lactulose (CHRONULAC) 10 GM/15ML solution 10 g (has no administration in time range)  oxyCODONE (Oxy IR/ROXICODONE) immediate release tablet 5 mg (has no administration in time range)  sevelamer carbonate (RENVELA) tablet 3,200 mg (has no administration in time range)  traZODone (DESYREL) tablet 150 mg (has no administration in time range)  albuterol (PROVENTIL) (2.5 MG/3ML) 0.083% nebulizer solution 2.5 mg (has no administration in time range)  nicotine (NICODERM CQ - dosed in mg/24 hours) patch 21 mg (has no administration in time range)  hydrALAZINE (APRESOLINE) injection 5 mg (has no administration in time range)  zolpidem (AMBIEN) tablet 5 mg (has no administration in time range)  cefTRIAXone (ROCEPHIN) 1 g in sodium chloride 0.9 % 100 mL IVPB (has no administration in time range)  sevelamer carbonate (RENVELA) tablet 1,600 mg (has no administration in time range)  hydrOXYzine (ATARAX/VISTARIL) tablet 10 mg (has no administration in time range)  nitroGLYCERIN (NITROGLYN) 2 % ointment 1 inch (1 inch Topical Given 02/14/18 1834)  lidocaine-EPINEPHrine (XYLOCAINE W/EPI) 1 %-1:100000 (with pres) injection 10 mL (10 mLs Other Given by Other 02/14/18 2058)     Initial Impression / Assessment and Plan / ED Course  I have reviewed the triage vital signs and the nursing notes.  Pertinent labs &  imaging results that were available during my care of the patient were reviewed by me and considered in my medical decision making (see chart for details).     Patient in severe respiratory distress on arrival, immediately placed on BiPAP.  He was severely hypertensive and hypoxic, placed Nitropaste.  Chest x-ray shows cardiomegaly with pulmonary edema.  I suspect volume overload although the patient states he had dialysis yesterday.  Patient never initially mention abdominal pain but it appears that he checked into the ED yesterday for abdominal pain.  He was having abdominal pain earlier this month and reportedly had Bactrim called in for concern for SBP.   Labs show WBC 12.5, sodium 127, chloride 88, normal potassium, reassuring VBG.  Contacted nephrology and discussed with Dr. Florene Glen, they will add patient to list for dialysis in the morning.  Since the patient is currently improved and has been able to wean to supplemental oxygen, I feel this is appropriate.  Performed diagnostic paracentesis at bedside, see procedure note for details.  Fluid sent for analysis.  I have held on antibiotics as the patient's vital signs are stable, he is  afebrile, and he did not have any focal abdominal tenderness for me.  Discussed admission with [facility] hospitalist, Dr. Blaine Hamper, and pt admitted for further care. Final Clinical Impressions(s) / ED Diagnoses   Final diagnoses:  None    ED Discharge Orders    None       Traniya Prichett, Wenda Overland, MD 02/15/18 [phone]

## 2018-02-14 NOTE — ED Notes (Signed)
ED Provider at bedside. 

## 2018-02-14 NOTE — Consult Note (Signed)
Chief Complaint: Patient was seen in consultation today for possible TIPS at the request of Jerene Bears  Referring Physician(s): Pyrtle, Lajuan Lines  History of Present Illness: [name] is a 57 y.o. male with a history of cirrhosis secondary to chronic hepatitis C and alcohol abuse.  He has required multiple large volume paracentesis procedures since February of this year.  He also has a history of end-stage renal disease and has been on hemodialysis for the last 10 years and dialyzes on Monday, Wednesday and Friday.  Last paracentesis procedure was on 02/07/2018 yielding 4.3 L of fluid.  This fluid did grow out Staphylococcus capitis with susceptibility results pending.  The patient was given a prescription for Bactrim orally which he started a few days ago and appears to only be taking at night.  He was just seen in the emergency department last night with complaints of abdominal pain.  He left the emergency department out of frustration.  Today [name] complains of abdominal pain and shortness of breath.  He also has a productive cough.  He complains of fatigue.  Past Medical History:  Diagnosis Date  . Anemia   . Anxiety   . Arthritis    left shoulder  . Atherosclerosis of aorta ([place])   . Cardiomegaly   . Chest pain    DATE UNKNOWN, C/O PERIODICALLY  . Cocaine abuse ([place])   . COPD exacerbation ([place]) 08/17/2016  . Coronary artery disease    stent 02/22/17  . ESRD (end stage renal disease) on dialysis ([place])    "[name]; MWF" (07/04/2017)  . GERD (gastroesophageal reflux disease)    DATE UNKNOWN  . Hemorrhoids   . Hepatitis B, chronic ([place])   . Hepatitis C   . History of kidney stones   . Hyperkalemia   . Hypertension   . Metabolic bone disease    Patient denies  . Mitral stenosis   . Myocardial infarction ([place])   . Pneumonia   . Pulmonary edema   . Solitary rectal ulcer syndrome 07/2017   at flex sig for rectal bleeding  . Tubular adenoma of colon      Past Surgical History:  Procedure Laterality Date  . A/V FISTULAGRAM Left 05/26/2017   Procedure: A/V FISTULAGRAM;  Surgeon: Conrad Lilesville, MD;  Location: [place] [facility];  Service: Cardiovascular;  Laterality: Left;  . A/V FISTULAGRAM Right 11/18/2017   Procedure: A/V FISTULAGRAM - Right Arm;  Surgeon: Elam Dutch, MD;  Location: [place] [facility];  Service: Cardiovascular;  Laterality: Right;  . APPLICATION OF WOUND VAC Left 06/14/2017   Procedure: APPLICATION OF WOUND VAC;  Surgeon: Katha Cabal, MD;  Location: [facility] ORS;  Service: Vascular;  Laterality: Left;  . AV FISTULA PLACEMENT  2012   BELIEVED WAS PLACED IN JUNE  . AV FISTULA PLACEMENT Right 08/09/2017   Procedure: Creation Right arm ARTERIOVENOUS BRACHIOCEPOHALIC FISTULA;  Surgeon: Elam Dutch, MD;  Location: [facility] OR;  Service: Vascular;  Laterality: Right;  . AV FISTULA PLACEMENT Right 11/22/2017   Procedure: INSERTION OF ARTERIOVENOUS (AV) GORE-TEX GRAFT RIGHT UPPER ARM;  Surgeon: Elam Dutch, MD;  Location: [place];  Service: Vascular;  Laterality: Right;  . BIOPSY  01/25/2018   Procedure: BIOPSY;  Surgeon: Jerene Bears, MD;  Location: [facility] ENDOSCOPY;  Service: Gastroenterology;;  . COLONOSCOPY    . COLONOSCOPY WITH PROPOFOL N/A 01/25/2018   Procedure: COLONOSCOPY WITH PROPOFOL;  Surgeon: Jerene Bears, MD;  Location: [facility] ENDOSCOPY;  Service: Gastroenterology;  Laterality: N/A;  . CORONARY STENT INTERVENTION N/A 02/22/2017   Procedure: CORONARY STENT INTERVENTION;  Surgeon: Nigel Mormon, MD;  Location: [place] [facility];  Service: Cardiovascular;  Laterality: N/A;  . ESOPHAGOGASTRODUODENOSCOPY (EGD) WITH PROPOFOL N/A 01/25/2018   Procedure: ESOPHAGOGASTRODUODENOSCOPY (EGD) WITH PROPOFOL;  Surgeon: Jerene Bears, MD;  Location: [place];  Service: Gastroenterology;  Laterality: N/A;  . FLEXIBLE SIGMOIDOSCOPY N/A 07/15/2017   Procedure: FLEXIBLE SIGMOIDOSCOPY;  Surgeon: Carol Ada, MD;  Location:  [place];  Service: Endoscopy;  Laterality: N/A;  . HEMORRHOID BANDING    . I&D EXTREMITY Left 06/01/2017   Procedure: IRRIGATION AND DEBRIDEMENT LEFT ARM HEMATOMA WITH LIGATION OF LEFT ARM AV FISTULA;  Surgeon: Elam Dutch, MD;  Location: [place];  Service: Vascular;  Laterality: Left;  . I&D EXTREMITY Left 06/14/2017   Procedure: IRRIGATION AND DEBRIDEMENT EXTREMITY;  Surgeon: Katha Cabal, MD;  Location: [facility] ORS;  Service: Vascular;  Laterality: Left;  . INSERTION OF DIALYSIS CATHETER  05/30/2017  . INSERTION OF DIALYSIS CATHETER N/A 05/30/2017   Procedure: INSERTION OF DIALYSIS CATHETER;  Surgeon: Elam Dutch, MD;  Location: [place];  Service: Vascular;  Laterality: N/A;  . IR PARACENTESIS  08/30/2017  . IR PARACENTESIS  09/29/2017  . IR PARACENTESIS  10/28/2017  . IR PARACENTESIS  11/09/2017  . IR PARACENTESIS  11/16/2017  . IR PARACENTESIS  11/28/2017  . IR PARACENTESIS  12/01/2017  . IR PARACENTESIS  12/06/2017  . IR PARACENTESIS  01/03/2018  . IR PARACENTESIS  01/23/2018  . IR PARACENTESIS  02/07/2018  . LEFT HEART CATH AND CORONARY ANGIOGRAPHY N/A 02/22/2017   Procedure: LEFT HEART CATH AND CORONARY ANGIOGRAPHY;  Surgeon: Nigel Mormon, MD;  Location: [place] [facility];  Service: Cardiovascular;  Laterality: N/A;  . LIGATION OF ARTERIOVENOUS  FISTULA Left 07/17/1094   Procedure: Plication of Left Arm Arteriovenous Fistula;  Surgeon: Elam Dutch, MD;  Location: [place];  Service: Vascular;  Laterality: Left;  . POLYPECTOMY    . POLYPECTOMY  01/25/2018   Procedure: POLYPECTOMY;  Surgeon: Jerene Bears, MD;  Location: [place];  Service: Gastroenterology;;  . REVISON OF ARTERIOVENOUS FISTULA Left 0/45/4098   Procedure: PLICATION OF DISTAL ANEURYSMAL SEGEMENT OF LEFT UPPER ARM ARTERIOVENOUS FISTULA;  Surgeon: Elam Dutch, MD;  Location: [place];  Service: Vascular;  Laterality: Left;  . REVISON OF ARTERIOVENOUS FISTULA Left 07/30/1476   Procedure: Plication of  Left Upper Arm Fistula ;  Surgeon: Waynetta Sandy, MD;  Location: [place];  Service: Vascular;  Laterality: Left;  . SKIN GRAFT SPLIT THICKNESS LEG / FOOT Left    SKIN GRAFT SPLIT THICKNESS LEFT ARM DONOR SITE: LEFT ANTERIOR THIGH  . SKIN SPLIT GRAFT Left 07/04/2017   Procedure: SKIN GRAFT SPLIT THICKNESS LEFT ARM DONOR SITE: LEFT ANTERIOR THIGH;  Surgeon: Elam Dutch, MD;  Location: [place];  Service: Vascular;  Laterality: Left;  . THROMBECTOMY W/ EMBOLECTOMY Left 06/05/2017   Procedure: EXPLORATION OF LEFT ARM FOR BLEEDING; OVERSEWED PROXIMAL FISTULA;  Surgeon: Angelia Mould, MD;  Location: [place];  Service: Vascular;  Laterality: Left;  . WOUND EXPLORATION Left 06/03/2017   Procedure: WOUND EXPLORATION WITH WOUND VAC APPLICATION TO LEFT ARM;  Surgeon: Angelia Mould, MD;  Location: [place];  Service: Vascular;  Laterality: Left;    Allergies: Morphine and related; Aspirin; Clonidine derivatives; Tramadol; and Tylenol [acetaminophen]  Medications: Prior to Admission medications   Medication  Sig Start Date End Date Taking? Authorizing Provider  albuterol (VENTOLIN HFA) 108 (90 Base) MCG/ACT inhaler Inhale 2 puffs into the lungs every 6 (six) hours as needed for wheezing or shortness of breath.    [provider]  diltiazem (CARDIZEM CD) 120 MG 24 hr capsule Take 1 capsule (120 mg total) by mouth daily. 06/23/17 06/23/18  ShahmehdiValeria Batman, MD  gabapentin (NEURONTIN) 100 MG capsule Take 100 mg by mouth 2 (two) times daily. 12/13/17   [provider]  hydrALAZINE (APRESOLINE) 100 MG tablet Take 100 mg by mouth 2 (two) times daily. Taking after HD on Dialysis days    [provider]  lactulose, encephalopathy, (CHRONULAC) 10 GM/15ML SOLN Take 15 ml's by mouth twice daily. Patient taking differently: Take 10 g by mouth 2 (two) times daily.  01/17/18   Esterwood, Amy S, PA-C  oxyCODONE (OXY IR/ROXICODONE) 5 MG immediate release tablet Take 5 mg by  mouth 2 (two) times daily as needed for pain. 12/13/17   [provider]  sevelamer carbonate (RENVELA) 800 MG tablet Take 4 tablets with meals  And 2 tablets twice daily with snacks Patient taking differently: Take 1,600-3,200 mg by mouth See admin instructions. Take 4 tablets with meals  And 2 tablets twice daily with snacks 01/22/18   Patrecia Pour, MD  sulfamethoxazole-trimethoprim (BACTRIM DS,SEPTRA DS) 800-160 MG tablet Take 1 tablet by mouth 2 (two) times daily. 02/09/18   Pyrtle, Lajuan Lines, MD  traZODone (DESYREL) 150 MG tablet Take 150 mg by mouth at bedtime as needed for sleep.    [provider]     Family History  Problem Relation Age of Onset  . Heart disease Mother   . Lung cancer Mother   . Heart disease Father   . Malignant hyperthermia Father   . COPD Father   . Throat cancer Sister   . Esophageal cancer Sister   . Hypertension Other   . COPD Other   . Colon cancer Neg Hx   . Colon polyps Neg Hx   . Rectal cancer Neg Hx   . Stomach cancer Neg Hx     Social History   Socioeconomic History  . Marital status: Single    Spouse name: Not on file  . Number of children: 3  . Years of education: 10  . Highest education level: Not on file  Occupational History  . Occupation: Unemployed  Social Needs  . Financial resource strain: Not on file  . Food insecurity:    Worry: Not on file    Inability: Not on file  . Transportation needs:    Medical: Not on file    Non-medical: Not on file  Tobacco Use  . Smoking status: Current Every Day Smoker    Packs/day: 0.50    Years: 43.00    Pack years: 21.50    Types: Cigarettes    Start date: 08/13/1973  . Smokeless tobacco: Never Used  Substance and Sexual Activity  . Alcohol use: Not Currently    Frequency: Never    Comment: quit drinking in 2017  . Drug use: Yes    Types: Marijuana    Comment: quit in 2017"  . Sexual activity: Not on file    Comment: "NOT LATELY" on cocaine  Lifestyle  . Physical  activity:    Days per week: Not on file    Minutes per session: Not on file  . Stress: Not on file  Relationships  . Social connections:  Talks on phone: Not on file    Gets together: Not on file    Attends religious service: Not on file    Active member of club or organization: Not on file    Attends meetings of clubs or organizations: Not on file    Relationship status: Not on file  Other Topics Concern  . Not on file  Social History Narrative   Lives alone   Caffeine use: Coffee-rare   Soda- daily      [facility] (03/10/17):   Originally from [facility]. Previously worked trimming trees. No pets currently. No bird or mold exposure.      Review of Systems: A 12 point ROS discussed and pertinent positives are indicated in the HPI above.  All other systems are negative.  Review of Systems  Constitutional: Positive for fatigue.  Respiratory: Positive for cough and shortness of breath.   Cardiovascular: Negative.   Gastrointestinal: Positive for abdominal distention and abdominal pain. Negative for diarrhea, nausea and vomiting.       Dark stools  Genitourinary: Negative.   Musculoskeletal: Negative.   Neurological: Negative.     Vital Signs: BP (!) 165/65   Pulse 72   Temp 98.5 F (36.9 C) (Oral)   Resp 15   Ht 5\' 9"  (1.753 m)   Wt 148 lb (67.1 kg)   SpO2 98%   BMI 21.86 kg/m   Physical Exam  Constitutional:  Appears ill, lethargic and cachectic.    Cardiovascular: Normal rate, regular rhythm and normal heart sounds. Exam reveals no gallop and no friction rub.  No murmur heard. Pulmonary/Chest: Effort normal. No stridor. He has no wheezes. He has no rales.  Decreased breath sounds at both bases.  Abdominal: He exhibits distension. There is no tenderness. There is no rebound and no guarding.  Decreased bowel sounds.  Musculoskeletal: He exhibits no edema.  Neurological:  Alert but somnolent.  Skin: Skin is warm and dry.  Vitals reviewed.    Imaging: US  Paracentesis  Result Date: 01/21/2018 INDICATION: History of alcoholic cirrhosis. Recurrent ascites. Request for diagnostic and therapeutic paracentesis. EXAM: ULTRASOUND GUIDED LEFT LOWER QUADRANT PARACENTESIS MEDICATIONS: None. COMPLICATIONS: None immediate. PROCEDURE: Informed written consent was obtained from the patient after a discussion of the risks, benefits and alternatives to treatment. A timeout was performed prior to the initiation of the procedure. Initial ultrasound scanning demonstrates a large amount of ascites within the left lower abdominal quadrant. The left lower abdomen was prepped and draped in the usual sterile fashion. 1% lidocaine with epinephrine was used for local anesthesia. Following this, a 19 gauge, 7-cm, Yueh catheter was introduced. An ultrasound image was saved for documentation purposes. The paracentesis was performed. The catheter was removed and a dressing was applied. The patient tolerated the procedure well without immediate post procedural complication. FINDINGS: A total of approximately 2.1 L of clear yellow fluid was removed. Samples were sent to the laboratory as requested by the clinical team. IMPRESSION: Successful ultrasound-guided paracentesis yielding 2.1 liters of peritoneal fluid. Read by: Ascencion Dike PA-C Electronically Signed   By: Jacqulynn Cadet M.D.   On: 01/21/2018 12:27   Dg Abd Acute W/chest  Result Date: 01/23/2018 CLINICAL DATA:  Abdominal pain and distention. EXAM: DG ABDOMEN ACUTE W/ 1V CHEST COMPARISON:  CT scan of the abdomen dated 01/02/2018 and chest x-ray dated 12/30/2017 FINDINGS: There are no dilated loops of bowel. Extensive ascites in the abdomen. Extensive arterial vascular calcifications throughout the abdomen. Chronic cardiomegaly. Double lumen central  venous catheter in place with the tips in the right atrium, unchanged. Minimal blunting of the left costophrenic angle laterally. Small metallic device in the rectum, unchanged since  the prior CT scan. IMPRESSION: Prominent ascites.  No significant change since the prior CT scan. Electronically Signed   By: Lorriane Shire M.D.   On: 01/23/2018 07:40   Ir Paracentesis  Result Date: 02/07/2018 INDICATION: Recurrent ascites. History of chronic hepatitis C and B; ESRD on dialysis. Request for diagnostic and therapeutic paracentesis. EXAM: ULTRASOUND GUIDED LEFT PARACENTESIS MEDICATIONS: 15 mL 2% lidocaine. COMPLICATIONS: None immediate. PROCEDURE: Informed written consent was obtained from the patient after a discussion of the risks, benefits and alternatives to treatment. A timeout was performed prior to the initiation of the procedure. Initial ultrasound scanning demonstrates a moderate amount of ascites within the left lower abdominal quadrant. The left lower abdomen was prepped and draped in the usual sterile fashion. 2% lidocaine was used for local anesthesia. Following this, a 6 Fr Safe-T-Centesis catheter was introduced. An ultrasound image was saved for documentation purposes. The paracentesis was performed. The catheter was removed and a dressing was applied. The patient tolerated the procedure well without immediate post procedural complication. FINDINGS: A total of approximately 4.3L of yellow fluid was removed. Samples were sent to the laboratory as requested by the clinical team. IMPRESSION: Successful ultrasound-guided paracentesis yielding 4.3 liters of peritoneal fluid. Read by Candiss Norse, PA-C Electronically Signed   By: Marybelle Killings M.D.   On: 02/07/2018 15:25   Ir Paracentesis  Result Date: 01/23/2018 INDICATION: Ascites secondary to alcoholic cirrhosis. Request of therapeutic paracentesis. EXAM: ULTRASOUND GUIDED therapeutic PARACENTESIS MEDICATIONS: 10 mL 2% lidocaine COMPLICATIONS: None immediate. PROCEDURE: Informed written consent was obtained from the patient after a discussion of the risks, benefits and alternatives to treatment. A timeout was performed prior  to the initiation of the procedure. Initial ultrasound scanning demonstrates a moderate amount of ascites within the left lower abdominal quadrant. The left lower abdomen was prepped and draped in the usual sterile fashion. 2% lidocaine was used for local anesthesia. Following this, a 19 gauge, 7-cm, Yueh catheter was introduced. An ultrasound image was saved for documentation purposes. The paracentesis was performed. The catheter was removed and a dressing was applied. The patient tolerated the procedure well without immediate post procedural complication. FINDINGS: A total of approximately 1.6L of clear yellow fluid was removed. Samples were not sent to the laboratory as requested by the clinical team. IMPRESSION: Successful ultrasound-guided paracentesis yielding 1.6 liters of peritoneal fluid. Read by Candiss Norse, PA-C Electronically Signed   By: Markus Daft M.D.   On: 01/23/2018 10:19    Labs:  CBC: Recent Labs    01/23/18 0627 01/24/18 1504 01/25/18 1047 02/02/18 2253 02/13/18 1722  WBC 9.8 8.5  --  9.1 8.9  HGB 10.5* 10.4* 12.2* 12.0* 11.7*  HCT 31.1* 30.9* 36.0* 35.7* 34.1*  PLT 89* 132*  --  140* 114*    COAGS: Recent Labs    07/14/17 0944 07/15/17 0035  11/15/17 2246  11/27/17 0225 12/06/17 1222 12/07/17 1556 01/21/18 0249  INR  --   --    < >  --    < > 1.30 1.20 1.21 1.26  APTT 47* 42*  --  33  --   --   --   --  36   < > = values in this interval not displayed.    BMP: Recent Labs    01/23/18 0627 01/24/18 1504 01/25/18 1047  02/02/18 2253 02/13/18 1722  NA 130* 131* 133* 137 129*  K 6.0* 6.1* 4.7 4.4 4.3  CL 91* 90*  --  97* 87*  CO2 23 26  --  26 28  GLUCOSE 93 116* 94 97 94  BUN 35* 56*  --  17 11  CALCIUM 9.1 8.7*  --  8.5* 8.0*  CREATININE 9.15* 10.63*  --  7.44* 5.10*  GFRNONAA 6* 5*  --  7* 12*  GFRAA 7* 6*  --  8* 13*    LIVER FUNCTION TESTS: Recent Labs    01/20/18 2214 01/23/18 0627 01/24/18 1504 02/02/18 2253 02/13/18 1722    BILITOT 0.8 1.1  --  0.8 0.8  AST 24 21  --  22 30  ALT 11 12  --  13 14  ALKPHOS 189* 159*  --  154* 164*  PROT 7.1 6.9  --  7.3 6.6  ALBUMIN 3.8 3.9 3.8 3.8 3.6    TUMOR MARKERS: Recent Labs    12/20/17 1141  AFPTM 2.1    Assessment and Plan:  Mr. Bartoli clearly appeared ill today and had a constant productive cough.  He was not febrile.  Based on recent lab work as well as chronic dialysis, his current MELD-Na score is 27 with an estimated 27-32% 90-day mortality.  It also is questionable whether he is taking his Bactrim correctly and he may require additional paracentesis and IV antibiotics for SBP should he worsen clinically.  Review of other records also shows a previous echocardiogram from February of this year demonstrating evidence of significant right heart strain and failure with flattening of the ventricular septum in diastole and systole.  There was evidence of severe mitral valve stenosis.  The right atrium, left atrium and right ventricle were severely dilated.  There also was a moderate pericardial effusion.  Mr. Kaufhold is clearly not a candidate for a TIPS procedure.  Given his MELD score, chronic dialysis and evidence of right heart failure and increased right heart pressures by echocardiography, he would be at exceedingly high risk for perioperative morbidity and mortality after TIPS.  I do not foresee that his condition will improve enough for him to be a candidate in the future.  In the meantime, periodic paracentesis procedures would be indicated.  Thank you for this interesting consult.  I greatly enjoyed meeting [name] and look forward to participating in their care.  A copy of this report was sent to the requesting provider on this date.  Electronically SignedAletta Edouard T 02/14/2018, 3:14 PM     I spent a total of 30 Minutes in face to face in clinical consultation, greater than 50% of which was counseling/coordinating care for  management of ascites and TIPS consultation.

## 2018-02-14 NOTE — ED Notes (Signed)
Pt. Ambulated to the bathroom, with stand by assist.

## 2018-02-14 NOTE — ED Triage Notes (Signed)
Patient here by EMS for increased WOB.  Albuterol treatment started by EMS.  Began on BiPap on arrival.  Able to nod yes and no. Unable to talk in complete sentences.

## 2018-02-14 NOTE — ED Notes (Signed)
EDP took pt off bipap and placed on 4L [place]. Will continue to monitor.

## 2018-02-15 ENCOUNTER — Other Ambulatory Visit: Payer: Self-pay

## 2018-02-15 DIAGNOSIS — J9621 Acute and chronic respiratory failure with hypoxia: Principal | ICD-10-CM

## 2018-02-15 DIAGNOSIS — K7031 Alcoholic cirrhosis of liver with ascites: Secondary | ICD-10-CM

## 2018-02-15 DIAGNOSIS — R1084 Generalized abdominal pain: Secondary | ICD-10-CM

## 2018-02-15 DIAGNOSIS — Z992 Dependence on renal dialysis: Secondary | ICD-10-CM

## 2018-02-15 DIAGNOSIS — N186 End stage renal disease: Secondary | ICD-10-CM

## 2018-02-15 DIAGNOSIS — I4891 Unspecified atrial fibrillation: Secondary | ICD-10-CM

## 2018-02-15 LAB — BASIC METABOLIC PANEL
Anion gap: 17 — ABNORMAL HIGH (ref 5–15)
BUN: 19 mg/dL (ref 6–20)
CO2: 25 mmol/L (ref 22–32)
Calcium: 8.4 mg/dL — ABNORMAL LOW (ref 8.9–10.3)
Chloride: 86 mmol/L — ABNORMAL LOW (ref 98–111)
Creatinine, Ser: 6.84 mg/dL — ABNORMAL HIGH (ref 0.61–1.24)
GFR calc Af Amer: 9 mL/min — ABNORMAL LOW (ref >60)
GFR calc non Af Amer: 8 mL/min — ABNORMAL LOW (ref >60)
Glucose, Bld: 155 mg/dL — ABNORMAL HIGH (ref 70–99)
Potassium: 5.4 mmol/L — ABNORMAL HIGH (ref 3.5–5.1)
Sodium: 128 mmol/L — ABNORMAL LOW (ref 135–145)

## 2018-02-15 LAB — GRAM STAIN

## 2018-02-15 LAB — RENAL FUNCTION PANEL
Albumin: 3.4 g/dL — ABNORMAL LOW (ref 3.5–5.0)
Anion gap: 16 — ABNORMAL HIGH (ref 5–15)
BUN: 26 mg/dL — ABNORMAL HIGH (ref 6–20)
CO2: 24 mmol/L (ref 22–32)
Calcium: 8.2 mg/dL — ABNORMAL LOW (ref 8.9–10.3)
Chloride: 85 mmol/L — ABNORMAL LOW (ref 98–111)
Creatinine, Ser: 7.29 mg/dL — ABNORMAL HIGH (ref 0.61–1.24)
GFR calc Af Amer: 9 mL/min — ABNORMAL LOW (ref >60)
GFR calc non Af Amer: 8 mL/min — ABNORMAL LOW (ref >60)
Glucose, Bld: 131 mg/dL — ABNORMAL HIGH (ref 70–99)
Phosphorus: 6 mg/dL — ABNORMAL HIGH (ref 2.5–4.6)
Potassium: 6.1 mmol/L — ABNORMAL HIGH (ref 3.5–5.1)
Sodium: 125 mmol/L — ABNORMAL LOW (ref 135–145)

## 2018-02-15 LAB — CBC
HCT: 32.3 % — ABNORMAL LOW (ref 39.0–52.0)
HCT: 33.6 % — ABNORMAL LOW (ref 39.0–52.0)
Hemoglobin: 11.3 g/dL — ABNORMAL LOW (ref 13.0–17.0)
Hemoglobin: 11.6 g/dL — ABNORMAL LOW (ref 13.0–17.0)
MCH: 27.3 pg (ref 26.0–34.0)
MCH: 27.3 pg (ref 26.0–34.0)
MCHC: 34.5 g/dL (ref 30.0–36.0)
MCHC: 35 g/dL (ref 30.0–36.0)
MCV: 78 fL (ref 78.0–100.0)
MCV: 79.1 fL (ref 78.0–100.0)
Platelets: 103 10*3/uL — ABNORMAL LOW (ref 150–400)
Platelets: 106 10*3/uL — ABNORMAL LOW (ref 150–400)
RBC: 4.14 MIL/uL — ABNORMAL LOW (ref 4.22–5.81)
RBC: 4.25 MIL/uL (ref 4.22–5.81)
RDW: 15.9 % — ABNORMAL HIGH (ref 11.5–15.5)
RDW: 15.9 % — ABNORMAL HIGH (ref 11.5–15.5)
WBC: 11.3 10*3/uL — ABNORMAL HIGH (ref 4.0–10.5)
WBC: 12.1 10*3/uL — ABNORMAL HIGH (ref 4.0–10.5)

## 2018-02-15 LAB — MRSA PCR SCREENING: MRSA by PCR: NEGATIVE

## 2018-02-15 LAB — AMMONIA: Ammonia: 28 umol/L (ref 9–35)

## 2018-02-15 LAB — BODY FLUID CELL COUNT WITH DIFFERENTIAL
Eos, Fluid: 0 %
Lymphs, Fluid: 23 %
Monocyte-Macrophage-Serous Fluid: 75 % (ref 50–90)
Neutrophil Count, Fluid: 2 % (ref 0–25)
Total Nucleated Cell Count, Fluid: 175 cu mm (ref 0–1000)

## 2018-02-15 LAB — PATHOLOGIST SMEAR REVIEW

## 2018-02-15 IMAGING — CR DG CHEST 1V PORT
1 series · 1 of 1 positions shown · non-contrast
Comparison: 07/11/2016

CLINICAL DATA: Shortness of breath

EXAM:
PORTABLE CHEST 1 VIEW

[AP]
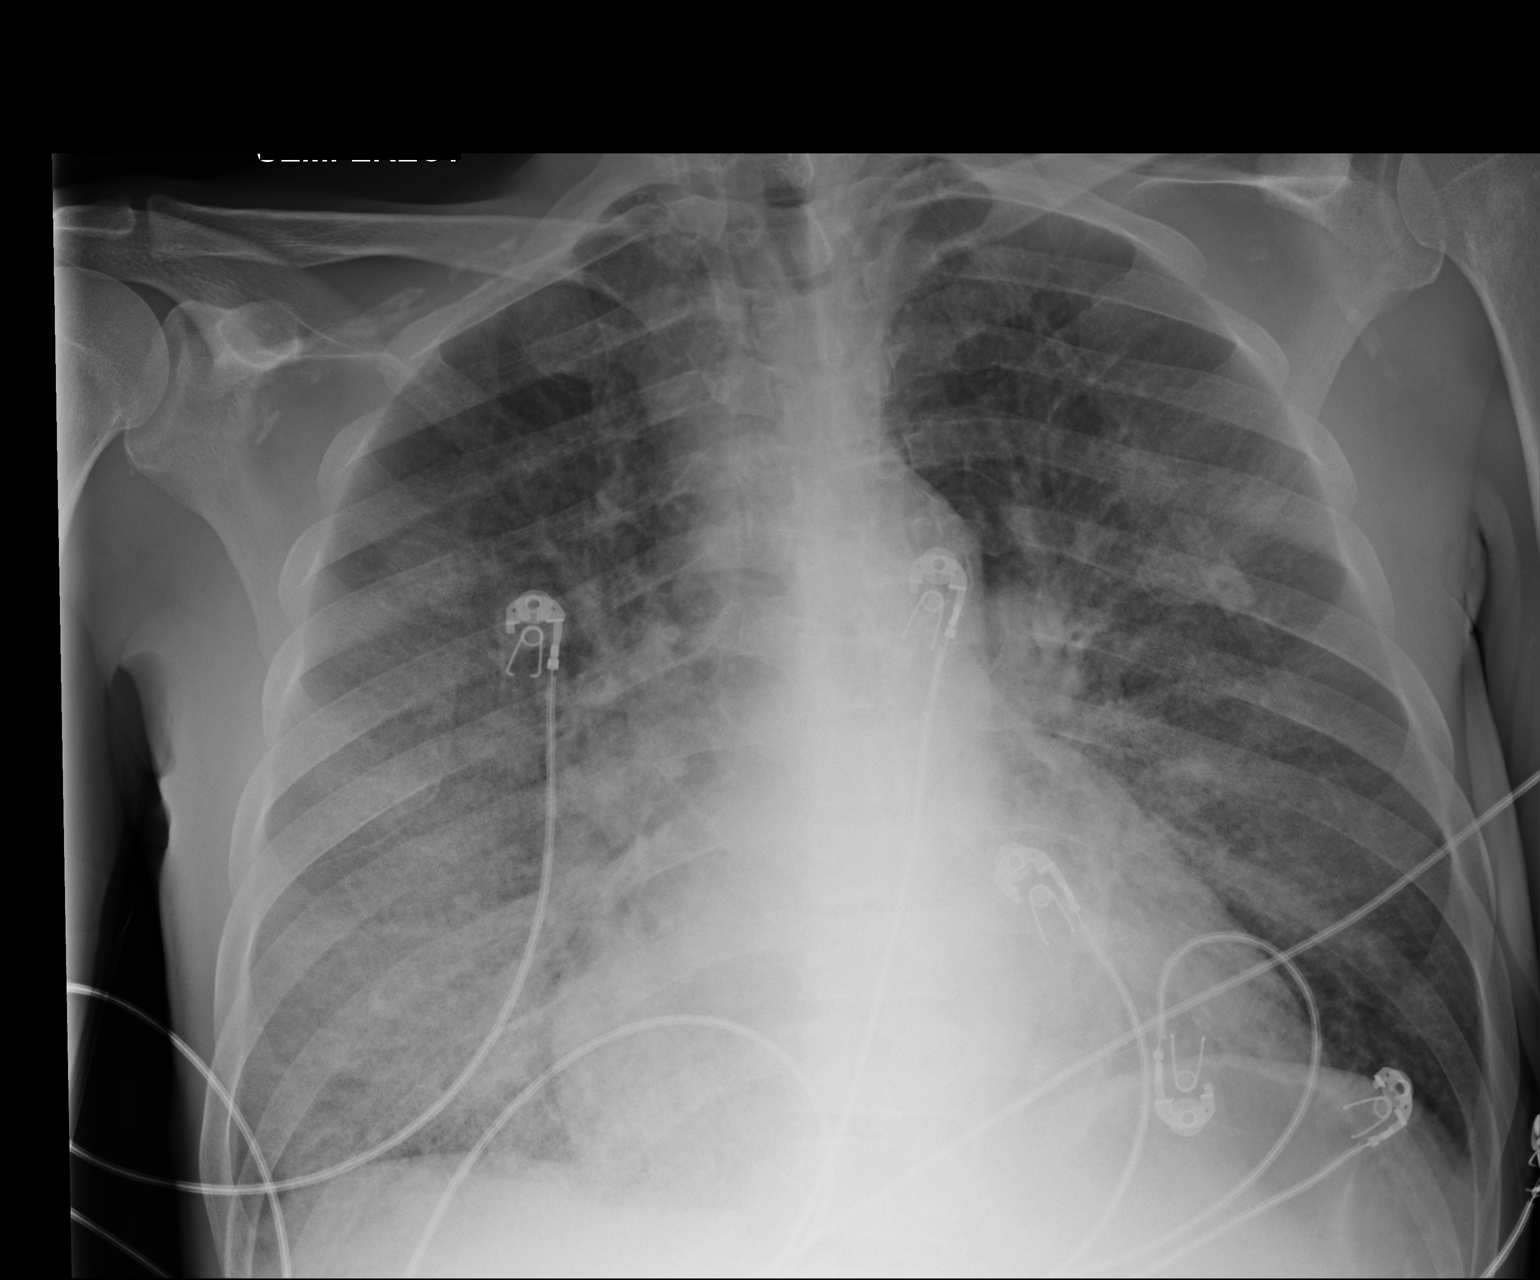

[1 of 1 positions shown; findings below may reference images not displayed]

FINDINGS: AP portable semi -erect view chest demonstrates mild cardiomegaly.
Central vascular congestion is present. Right greater than left
interstitial and alveolar opacities suspicious for pulmonary edema.
Suspect tiny effusions. No pneumothorax.
IMPRESSION: Cardiomegaly with central vascular congestion and right greater than
left interstitial and alveolar opacities suspicious for pulmonary
edema. Probable tiny effusions.

## 2018-02-15 MED ORDER — ALBUMIN HUMAN 25 % IV SOLN: 50.0000 g | Freq: Once | INTRAVENOUS | Status: DC

## 2018-02-15 MED ORDER — SODIUM CHLORIDE 0.9 % IV SOLN: 100.0000 mL | INTRAVENOUS | Status: DC | PRN

## 2018-02-15 MED ORDER — CALCITRIOL 0.5 MCG PO CAPS
1.0000 ug | ORAL_CAPSULE | ORAL | Status: DC
  Administered 2018-02-15: 1 ug via ORAL

## 2018-02-15 MED ORDER — LIDOCAINE HCL (PF) 1 % IJ SOLN: 5.0000 mL | INTRAMUSCULAR | Status: DC | PRN

## 2018-02-15 MED ORDER — PENTAFLUOROPROP-TETRAFLUOROETH EX AERO: 1.0000 "application " | INHALATION_SPRAY | CUTANEOUS | Status: DC | PRN

## 2018-02-15 MED ORDER — HYDROXYZINE HCL 10 MG PO TABS
10.0000 mg | ORAL_TABLET | Freq: Three times a day (TID) | ORAL | Status: DC | PRN
  Filled 2018-02-15: qty 1

## 2018-02-15 MED ORDER — HEPARIN SODIUM (PORCINE) 1000 UNIT/ML DIALYSIS: 1000.0000 [IU] | INTRAMUSCULAR | Status: DC | PRN

## 2018-02-15 MED ORDER — CALCITRIOL 0.5 MCG PO CAPS
ORAL_CAPSULE | ORAL | Status: AC
  Administered 2018-02-15: 1 ug via ORAL
  Filled 2018-02-15: qty 2

## 2018-02-15 MED ORDER — LIDOCAINE-PRILOCAINE 2.5-2.5 % EX CREA
1.0000 "application " | TOPICAL_CREAM | CUTANEOUS | Status: DC | PRN
  Filled 2018-02-15: qty 5

## 2018-02-15 MED ORDER — NEPRO/CARBSTEADY PO LIQD: 237.0000 mL | Freq: Two times a day (BID) | ORAL | Status: DC

## 2018-02-15 MED ORDER — CHLORHEXIDINE GLUCONATE CLOTH 2 % EX PADS
6.0000 | MEDICATED_PAD | Freq: Every day | CUTANEOUS | Status: DC
  Administered 2018-02-15: 6 via TOPICAL

## 2018-02-15 MED ORDER — HYDRALAZINE HCL 20 MG/ML IJ SOLN: 5.0000 mg | Freq: Once | INTRAMUSCULAR | Status: DC

## 2018-02-15 MED ORDER — SODIUM CHLORIDE 0.9 % IV SOLN
1.0000 g | INTRAVENOUS | Status: DC
  Administered 2018-02-15: 1 g via INTRAVENOUS
  Filled 2018-02-15 (×3): qty 10

## 2018-02-15 MED ORDER — SEVELAMER CARBONATE 800 MG PO TABS: 1600.0000 mg | ORAL_TABLET | ORAL | Status: DC

## 2018-02-15 MED ORDER — CALCITRIOL 0.5 MCG PO CAPS: 1.0000 ug | ORAL_CAPSULE | Freq: Every day | ORAL | Status: DC

## 2018-02-15 MED ORDER — ALTEPLASE 2 MG IJ SOLR: 2.0000 mg | Freq: Once | INTRAMUSCULAR | Status: DC | PRN

## 2018-02-15 MED ORDER — OXYCODONE HCL 5 MG PO TABS
10.0000 mg | ORAL_TABLET | Freq: Once | ORAL | Status: AC
  Administered 2018-02-15: 10 mg via ORAL
  Filled 2018-02-15: qty 2

## 2018-02-15 NOTE — Progress Notes (Signed)
Initial Nutrition Assessment  DOCUMENTATION CODES:   Not applicable  INTERVENTION:    Nepro Shake po BID, each supplement provides 425 kcal and 19 grams protein  NUTRITION DIAGNOSIS:   Increased nutrient needs related to chronic illness as evidenced by estimated needs  GOAL:   Patient will meet greater than or equal to 90% of their needs  MONITOR:   PO intake, Supplement acceptance, Labs, Skin, Weight trends, I & O's  REASON FOR ASSESSMENT:   Malnutrition Screening Tool  ASSESSMENT:   57 y.o. Male with PMH significant for cirrhosis with ascites, SBP, hypertension, COPD, GERD, polysubstance abuse (tobacco, cocaine and marijuana), CAD, ESRD-HD (MWF), A fib/A flutter not on AC, who presents with abdominal pain and SOB.  Pt admitted for fluid overload. RD attempted to speak with pt this AM.  He is very distracted. Asking for underwear.  Pt is known to this RD from previous admission in 09/2017. Identified with moderate malnutrition; more than likely ongoing. Pt does like Butter Pecan Nepro Shake supplements.   Labs reviewed. Na 125 (L). K 6.1 (H). P 6.0 (H). Medications reviewed and include Renvela.  NUTRITION - FOCUSED PHYSICAL EXAM:  Unable to complete at this time.  Diet Order:   Diet Order           Diet renal with fluid restriction Fluid restriction: 1200 mL Fluid; Room service appropriate? Yes; Fluid consistency: Thin  Diet effective now         EDUCATION NEEDS:   Not appropriate for education at this time  Skin:  Skin Assessment: Reviewed RN Assessment  Last BM:  8/7  Height:   Ht Readings from Last 1 Encounters:  02/15/18 5\' 9"  (1.753 m)   Weight:   Wt Readings from Last 1 Encounters:  02/15/18 161 lb 9.6 oz (73.3 kg)   BMI:  Body mass index is 23.86 kg/m.  Estimated Nutritional Needs:   Kcal:  2100-2300  Protein:  105-120 gm  Fluid:  1000 ml + UOP  Arthur Holms, RD, LDN Pager #: [phone] After-Hours Pager #: [phone]

## 2018-02-15 NOTE — Care Management Note (Signed)
Case Management Note  Patient Details  Name: [name] MRN: [id number] Date of Birth: [birth year]  Subjective/Objective:  From home alone, has history significant of alcohol abusein remission,HBV, HCV,cirrhosis with ascites,SBP,hypertension, COPD, GERD, anxiety, anemia, polysubstance abuse (tobacco, cocaine and marijuana), CAD, ESRD-HD (MWF),A fib/A flutter not on AC,who presents with abdominal painand SOB.                    Action/Plan: NCM will follow for transition of care needs.   Expected Discharge Date:                  Expected Discharge Plan:  Home/Self Care  In-House Referral:     Discharge planning Services  CM Consult  Post Acute Care Choice:    Choice offered to:     DME Arranged:    DME Agency:     HH Arranged:    HH Agency:     Status of Service:  In process, will continue to follow  If discussed at Long Length of Stay Meetings, dates discussed:    Additional Comments:  Zenon Mayo, RN 02/15/2018, 10:45 AM

## 2018-02-15 NOTE — Consult Note (Addendum)
[facility] Renal Consultation Note    Indication for Consultation:  Management of ESRD/hemodialysis; anemia, hypertension/volume and secondary hyperparathyroidism PCP: Dr. Darnell Level. Osei-Bonsu  HPI: [name] is a 57 y.o. male with ESRD on hemodialysis MWF at [facility]. PMH significant for HTN, hepatitis B, hepatitis C Cirrhosis with ascites, polysubstance abuse, CAD, Afib/aflutter, COPD, AOCD, SHPT. Last OP HD 02/13/18 ran full treatment, left 4.9 kg above OP EDW.   Patient presented to ED 02/14/2018 with complaints of worsening SOB, diaphoresis and inability to speak in full sentences.  CXR showed mild to moderate pulmonary edema. He was initially hypertensive on arrival. O2 sats 94% on 4L/M [place]. He was placed on BIPAP with resolution of symptoms. Patient C/O severe abdominal pain. Diagnostic paracentesis was performed by ED physician. Fluid sent for culture to R/O SBP. Patient apparently was started on bactrim for positive culture of peritoneal fluid. WBC 12.5 Na 129 K+ 4.9 Lactic acid 2.77. BC drawn, no ABX. EKG showed Aflutter with 4:1 conduction. He has been admitted for acute on chronic respiratory failure and abdominal pain.   Past Medical History:  Diagnosis Date  . Anemia   . Anxiety   . Arthritis    left shoulder  . Atherosclerosis of aorta ([place])   . Cardiomegaly   . Chest pain    DATE UNKNOWN, C/O PERIODICALLY  . Cocaine abuse ([place])   . COPD exacerbation ([place]) 08/17/2016  . Coronary artery disease    stent 02/22/17  . ESRD (end stage renal disease) on dialysis ([place])    "[name]; MWF" (07/04/2017)  . GERD (gastroesophageal reflux disease)    DATE UNKNOWN  . Hemorrhoids   . Hepatitis B, chronic ([place])   . Hepatitis C   . History of kidney stones   . Hyperkalemia   . Hypertension   . Metabolic bone disease    Patient denies  . Mitral stenosis   . Myocardial infarction ([place])   . Pneumonia   . Pulmonary edema   . Solitary rectal  ulcer syndrome 07/2017   at flex sig for rectal bleeding  . Tubular adenoma of colon    Past Surgical History:  Procedure Laterality Date  . A/V FISTULAGRAM Left 05/26/2017   Procedure: A/V FISTULAGRAM;  Surgeon: Conrad [place], MD;  Location: [place] CV LAB;  Service: Cardiovascular;  Laterality: Left;  . A/V FISTULAGRAM Right 11/18/2017   Procedure: A/V FISTULAGRAM - Right Arm;  Surgeon: Elam Dutch, MD;  Location: [place] CV LAB;  Service: Cardiovascular;  Laterality: Right;  . APPLICATION OF WOUND VAC Left 06/14/2017   Procedure: APPLICATION OF WOUND VAC;  Surgeon: Katha Cabal, MD;  Location: [facility] ORS;  Service: Vascular;  Laterality: Left;  . AV FISTULA PLACEMENT  2012   BELIEVED WAS PLACED IN JUNE  . AV FISTULA PLACEMENT Right 08/09/2017   Procedure: Creation Right arm ARTERIOVENOUS BRACHIOCEPOHALIC FISTULA;  Surgeon: Elam Dutch, MD;  Location: [facility] OR;  Service: Vascular;  Laterality: Right;  . AV FISTULA PLACEMENT Right 11/22/2017   Procedure: INSERTION OF ARTERIOVENOUS (AV) GORE-TEX GRAFT RIGHT UPPER ARM;  Surgeon: Elam Dutch, MD;  Location: [place];  Service: Vascular;  Laterality: Right;  . BIOPSY  01/25/2018   Procedure: BIOPSY;  Surgeon: Jerene Bears, MD;  Location: [facility] ENDOSCOPY;  Service: Gastroenterology;;  . COLONOSCOPY    . COLONOSCOPY WITH PROPOFOL N/A 01/25/2018   Procedure: COLONOSCOPY WITH PROPOFOL;  Surgeon: Jerene Bears, MD;  Location: [place] ENDOSCOPY;  Service: Gastroenterology;  Laterality: N/A;  . CORONARY STENT INTERVENTION N/A 02/22/2017   Procedure: CORONARY STENT INTERVENTION;  Surgeon: Nigel Mormon, MD;  Location: [place] CV LAB;  Service: Cardiovascular;  Laterality: N/A;  . ESOPHAGOGASTRODUODENOSCOPY (EGD) WITH PROPOFOL N/A 01/25/2018   Procedure: ESOPHAGOGASTRODUODENOSCOPY (EGD) WITH PROPOFOL;  Surgeon: Jerene Bears, MD;  Location: [place];  Service: Gastroenterology;  Laterality: N/A;  . FLEXIBLE SIGMOIDOSCOPY N/A  07/15/2017   Procedure: FLEXIBLE SIGMOIDOSCOPY;  Surgeon: Carol Ada, MD;  Location: [place];  Service: Endoscopy;  Laterality: N/A;  . HEMORRHOID BANDING    . I&D EXTREMITY Left 06/01/2017   Procedure: IRRIGATION AND DEBRIDEMENT LEFT ARM HEMATOMA WITH LIGATION OF LEFT ARM AV FISTULA;  Surgeon: Elam Dutch, MD;  Location: [place];  Service: Vascular;  Laterality: Left;  . I&D EXTREMITY Left 06/14/2017   Procedure: IRRIGATION AND DEBRIDEMENT EXTREMITY;  Surgeon: Katha Cabal, MD;  Location: [facility] ORS;  Service: Vascular;  Laterality: Left;  . INSERTION OF DIALYSIS CATHETER  05/30/2017  . INSERTION OF DIALYSIS CATHETER N/A 05/30/2017   Procedure: INSERTION OF DIALYSIS CATHETER;  Surgeon: Elam Dutch, MD;  Location: [place];  Service: Vascular;  Laterality: N/A;  . IR PARACENTESIS  08/30/2017  . IR PARACENTESIS  09/29/2017  . IR PARACENTESIS  10/28/2017  . IR PARACENTESIS  11/09/2017  . IR PARACENTESIS  11/16/2017  . IR PARACENTESIS  11/28/2017  . IR PARACENTESIS  12/01/2017  . IR PARACENTESIS  12/06/2017  . IR PARACENTESIS  01/03/2018  . IR PARACENTESIS  01/23/2018  . IR PARACENTESIS  02/07/2018  . IR RADIOLOGIST EVAL & MGMT  02/14/2018  . LEFT HEART CATH AND CORONARY ANGIOGRAPHY N/A 02/22/2017   Procedure: LEFT HEART CATH AND CORONARY ANGIOGRAPHY;  Surgeon: Nigel Mormon, MD;  Location: [place] CV LAB;  Service: Cardiovascular;  Laterality: N/A;  . LIGATION OF ARTERIOVENOUS  FISTULA Left 02/09/8298   Procedure: Plication of Left Arm Arteriovenous Fistula;  Surgeon: Elam Dutch, MD;  Location: [place];  Service: Vascular;  Laterality: Left;  . POLYPECTOMY    . POLYPECTOMY  01/25/2018   Procedure: POLYPECTOMY;  Surgeon: Jerene Bears, MD;  Location: [place];  Service: Gastroenterology;;  . REVISON OF ARTERIOVENOUS FISTULA Left 3/71/6967   Procedure: PLICATION OF DISTAL ANEURYSMAL SEGEMENT OF LEFT UPPER ARM ARTERIOVENOUS FISTULA;  Surgeon: Elam Dutch, MD;  Location:  [place];  Service: Vascular;  Laterality: Left;  . REVISON OF ARTERIOVENOUS FISTULA Left 8/93/8101   Procedure: Plication of Left Upper Arm Fistula ;  Surgeon: Waynetta Sandy, MD;  Location: [place];  Service: Vascular;  Laterality: Left;  . SKIN GRAFT SPLIT THICKNESS LEG / FOOT Left    SKIN GRAFT SPLIT THICKNESS LEFT ARM DONOR SITE: LEFT ANTERIOR THIGH  . SKIN SPLIT GRAFT Left 07/04/2017   Procedure: SKIN GRAFT SPLIT THICKNESS LEFT ARM DONOR SITE: LEFT ANTERIOR THIGH;  Surgeon: Elam Dutch, MD;  Location: [place];  Service: Vascular;  Laterality: Left;  . THROMBECTOMY W/ EMBOLECTOMY Left 06/05/2017   Procedure: EXPLORATION OF LEFT ARM FOR BLEEDING; OVERSEWED PROXIMAL FISTULA;  Surgeon: Angelia Mould, MD;  Location: [place];  Service: Vascular;  Laterality: Left;  . WOUND EXPLORATION Left 06/03/2017   Procedure: WOUND EXPLORATION WITH WOUND VAC APPLICATION TO LEFT ARM;  Surgeon: Angelia Mould, MD;  Location: [facility] OR;  Service: Vascular;  Laterality: Left;   Family History  Problem Relation Age of Onset  . Heart disease Mother   .  Lung cancer Mother   . Heart disease Father   . Malignant hyperthermia Father   . COPD Father   . Throat cancer Sister   . Esophageal cancer Sister   . Hypertension Other   . COPD Other   . Colon cancer Neg Hx   . Colon polyps Neg Hx   . Rectal cancer Neg Hx   . Stomach cancer Neg Hx    Social History:  reports that he has been smoking cigarettes.  He started smoking about 44 years ago. He has a 21.50 pack-year smoking history. He has never used smokeless tobacco. He reports that he drank alcohol. He reports that he has current or past drug history. Drug: Marijuana. Allergies  Allergen Reactions  . Morphine And Related Other (See Comments)    Stomach pain  . Aspirin Other (See Comments)    STOMACH PAIN  . Clonidine Derivatives Itching  . Tramadol Itching  . Tylenol [Acetaminophen] Nausea Only    Stomach ache   Prior to  Admission medications   Medication Sig Start Date End Date Taking? Authorizing Provider  albuterol (VENTOLIN HFA) 108 (90 Base) MCG/ACT inhaler Inhale 2 puffs into the lungs every 6 (six) hours as needed for wheezing or shortness of breath.   Yes [provider]  diltiazem (CARDIZEM CD) 120 MG 24 hr capsule Take 1 capsule (120 mg total) by mouth daily. 06/23/17 06/23/18 Yes Shahmehdi, Seyed A, MD  gabapentin (NEURONTIN) 100 MG capsule Take 100 mg by mouth 2 (two) times daily. 12/13/17  Yes [provider]  hydrALAZINE (APRESOLINE) 100 MG tablet Take 100 mg by mouth 2 (two) times daily. Taking after HD on Dialysis days   Yes [provider]  lactulose, encephalopathy, (CHRONULAC) 10 GM/15ML SOLN Take 15 ml's by mouth twice daily. Patient taking differently: Take 10 g by mouth 2 (two) times daily.  01/17/18  Yes Esterwood, Amy S, PA-C  oxyCODONE (OXY IR/ROXICODONE) 5 MG immediate release tablet Take 5 mg by mouth 2 (two) times daily as needed for pain. 12/13/17  Yes [provider]  sevelamer carbonate (RENVELA) 800 MG tablet Take 4 tablets with meals  And 2 tablets twice daily with snacks Patient taking differently: Take 1,600-3,200 mg by mouth See admin instructions. Take 4 tablets with meals  And 2 tablets twice daily with snacks 01/22/18  Yes Patrecia Pour, MD  sulfamethoxazole-trimethoprim (BACTRIM DS,SEPTRA DS) 800-160 MG tablet Take 1 tablet by mouth 2 (two) times daily. 02/09/18  Yes Pyrtle, Lajuan Lines, MD  traZODone (DESYREL) 150 MG tablet Take 150 mg by mouth at bedtime as needed for sleep.   Yes [provider]   Current Facility-Administered Medications  Medication Dose Route Frequency Provider Last Rate Last Dose  . 0.9 %  sodium chloride infusion  100 mL Intravenous PRN Valentina Gu, NP      . 0.9 %  sodium chloride infusion  100 mL Intravenous PRN Valentina Gu, NP      . albumin human 25 % solution 50 g  50 g Intravenous Once Dana Allan I, MD      . albuterol (PROVENTIL) (2.5 MG/3ML) 0.083% nebulizer solution 2.5 mg  2.5 mg Nebulization Q4H PRN Ivor Costa, MD      . alteplase (CATHFLO ACTIVASE) injection 2 mg  2 mg Intracatheter Once PRN Valentina Gu, NP      . calcitRIOL (ROCALTROL) capsule 1 mcg  1 mcg Oral Q M,W,F-HD Valentina Gu, NP      .  cefTRIAXone (ROCEPHIN) 1 g in sodium chloride 0.9 % 100 mL IVPB  1 g Intravenous Q24H Ivor Costa, MD 200 mL/hr at 02/15/18 0508 1 g at 02/15/18 0508  . Chlorhexidine Gluconate Cloth 2 % PADS 6 each  6 each Topical Q0600 Valentina Gu, NP   6 each at 02/15/18 1130  . diltiazem (CARDIZEM CD) 24 hr capsule 120 mg  120 mg Oral Daily Ivor Costa, MD   120 mg at 02/15/18 0944  . gabapentin (NEURONTIN) capsule 100 mg  100 mg Oral BID Ivor Costa, MD   100 mg at 02/15/18 5366  . heparin injection 1,000 Units  1,000 Units Dialysis PRN Valentina Gu, NP      . hydrALAZINE (APRESOLINE) injection 5 mg  5 mg Intravenous Q2H PRN Ivor Costa, MD      . hydrALAZINE (APRESOLINE) tablet 100 mg  100 mg Oral BID Ivor Costa, MD   100 mg at 02/15/18 0943  . hydrOXYzine (ATARAX/VISTARIL) tablet 10 mg  10 mg Oral TID PRN Ivor Costa, MD      . lactulose ([name]) 10 GM/15ML solution 10 g  10 g Oral BID Ivor Costa, MD   10 g at 02/15/18 0944  . lidocaine (PF) (XYLOCAINE) 1 % injection 5 mL  5 mL Intradermal PRN Valentina Gu, NP      . lidocaine-prilocaine (EMLA) cream 1 application  1 application Topical PRN Valentina Gu, NP      . nicotine (NICODERM CQ - dosed in mg/24 hours) patch 21 mg  21 mg Transdermal Daily Ivor Costa, MD      . oxyCODONE (Oxy IR/ROXICODONE) immediate release tablet 5 mg  5 mg Oral BID PRN Ivor Costa, MD   5 mg at 02/15/18 0943  . pentafluoroprop-tetrafluoroeth (GEBAUERS) aerosol 1 application  1 application Topical PRN Valentina Gu, NP      . sevelamer carbonate (RENVELA) tablet 1,600 mg  1,600 mg Oral With snacks Ivor Costa, MD       . sevelamer carbonate (RENVELA) tablet 3,200 mg  3,200 mg Oral TID WC Ivor Costa, MD   3,200 mg at 02/15/18 1130  . traZODone (DESYREL) tablet 150 mg  150 mg Oral QHS PRN Ivor Costa, MD      . zolpidem (AMBIEN) tablet 5 mg  5 mg Oral QHS PRN Ivor Costa, MD       Labs: Basic Metabolic Panel: Recent Labs  Lab 02/14/18 1919 02/15/18 0023 02/15/18 1205  NA 127* 128* 125*  K 4.9 5.4* 6.1*  CL 88* 86* 85*  CO2 25 25 24   GLUCOSE 138* 155* 131*  BUN 20 19 26*  CREATININE 6.62* 6.84* 7.29*  CALCIUM 7.6* 8.4* 8.2*  PHOS  --   --  6.0*   Liver Function Tests: Recent Labs  Lab 02/13/18 1722 02/14/18 1919 02/15/18 1205  AST 30 24  --   ALT 14 12  --   ALKPHOS 164* 161*  --   BILITOT 0.8 0.9  --   PROT 6.6 6.7  --   ALBUMIN 3.6 3.5 3.4*   Recent Labs  Lab 02/13/18 1722  LIPASE 28   Recent Labs  Lab 02/15/18 0023  AMMONIA 28   CBC: Recent Labs  Lab 02/13/18 1722 02/14/18 1919 02/15/18 0023 02/15/18 1205  WBC 8.9 12.5* 12.1* 11.3*  NEUTROABS  --  11.3*  --   --   HGB 11.7* 10.6* 11.6* 11.3*  HCT 34.1* 30.9* 33.6* 32.3*  MCV 78.8 79.6 79.1  78.0  PLT 114* 101* 103* 106*   Cardiac Enzymes: No results for input(s): CKTOTAL, CKMB, CKMBINDEX, TROPONINI in the last 168 hours. CBG: No results for input(s): GLUCAP in the last 168 hours. Iron Studies: No results for input(s): IRON, TIBC, TRANSFERRIN, FERRITIN in the last 72 hours. Studies/Results: Dg Chest Port 1 View  Result Date: 02/14/2018 CLINICAL DATA:  Respiratory distress.  Current smoker. EXAM: PORTABLE CHEST 1 VIEW COMPARISON:  01/23/2018 FINDINGS: Cardiomegaly with interstitial edema, increased since prior comparison. Right IJ approach dialysis catheter terminates in the right atrium. Moderate aortic atherosclerosis without aneurysmal dilatation noted. No significant effusion. No pulmonary consolidation or pneumothorax. Slight elevation of the left hemidiaphragm. No acute osseous abnormality. IMPRESSION:  Cardiomegaly with mild-to-moderate interstitial pulmonary edema, increased since prior. Electronically Signed   By: Ashley Royalty M.D.   On: 02/14/2018 19:10   Ir Radiologist Eval & Mgmt  Result Date: 02/14/2018 Please refer to notes tab for details about interventional procedure. (Op Note)   ROS: As per HPI otherwise negative.  Physical Exam: Vitals:   02/15/18 0413 02/15/18 0443 02/15/18 0505 02/15/18 1149  BP: (!) 155/96 (!) 146/87 (!) 158/85 (!) 147/88  Pulse:    90  Resp:    18  Temp:    98.4 F (36.9 C)  TempSrc:    Oral  SpO2: 99% 100% 100% 98%  Weight:      Height:         General: Chronically ill appearing male in no acute distress. Head: Normocephalic, atraumatic, sclera non-icteric, mucus membranes are moist Neck: Supple. JVD elevated 1/3 to mandible Lungs: Bilateral breath sounds with scattered rales posteriorly, few inspiratory wheezes upper lung fields anteriorly. No WOB at present.  Heart: RRR with S1 S2, + S4  Abdomen: Distended, active BS non-tender to palpation. Serous fld draining RLQ at paracentesis site.  M-S:  Strength and tone appear normal for age. Lower extremities:without edema or ischemic changes, no open wounds  Neuro: Alert and oriented X 3. Moves all extremities spontaneously. Psych:  Responds to questions appropriately with a normal affect. Dialysis Access: RUA AVG + bruit. (Refuses to allow AVG to be cannulated) RIJ TDC drsg soiled and loose.   Dialysis Orders: [place] M[facility] 4 hrs 180NRe 400/800 66.5 kg 2.0 K/2.0 Ca  RIJ TDC -No Heparin -Parsabiv 7.5 mg IV TIW (not of hospital formulary) -Calcitriol 1.0 mg PO TIW (last PTH 1261 02/01/18) -Venofer 100 mg IV X 5 doses (2/5 given last Fe 69 Tsat 24 02/01/18)  Assessment/Plan: 1.  Acute on Chronic Respiratory Failure-Per primary. Pulmonary edema present on CXR. HD today-attempt 5 liters, lower volume as tolerated. 2.  Abdominal pain-concern for SBP. He has been worked up for this many times. PD fluid  WBC 175 Peritoneal fluid cx NG 24 hrs, BC NG 24 hrs. No ABX yet. Per primary.  3.  Cirrhosis with ascites. Followed at OP by Dr. Hilarie Fredrickson. Per primary 4.  ESRD -  MWF. HD today on schedule. K+ 6.0 Use 2.0 K bath. May need serial HD for volume removal.  5.  Hypertension/volume  - Better control of BP at present. Attempt max volume removal with HD today.  6.  Anemia  - HGB 11.6 No ESA needed. Hold Fe load until [facility] results final.  7.  Metabolic bone disease -  Continue binders, VDRA.  8.  Nutrition - Albumin 3.5 Renal diet, renal vits, nepro. 9.  Aflutter-rate is controlled. NOT A CANDIDATE anticoagulation. On Diltiazem per primary 10.  Hyponatremia-volume overloaded.  Should improve with HD. 11.  COPD-per primary  Rita H. Owens Shark, NP-C 02/15/2018, 1:22 PM  [employer] [phone]  Pt seen, examined and agree w A/P as above. ESRD pt w/ multiple comorbidities (cirrhosis, COPD) [name] MD [facility] pager [phone]   02/15/2018, 2:12 PM

## 2018-02-15 NOTE — Progress Notes (Signed)
On call Provider Schorr,NP notified of the patient being in AFIB and having burst of increased HR 130-150s frequently. HR doesn't sustain in the 130s-150s. Vital signs are stable. Pt doesn;t complains of CP or any other distress.

## 2018-02-15 NOTE — Progress Notes (Signed)
1425 pm  Attempted to call to bring patient to IR for Paracentesis.  Pt in HD until around 6pm.  This will require the Paracentesis to be moved to 8/8. I call RN to notify.

## 2018-02-15 NOTE — Procedures (Signed)
   I was present at this dialysis session, have reviewed the session itself and made  appropriate changes [name] MD [place] pager [phone]   02/15/2018, 2:13 PM

## 2018-02-15 NOTE — Progress Notes (Signed)
After requesting to leave dialysis early, pt arrived back on floor and requested an AMA form to sign so he could leave. RN educated pt that it was important for him to stay and get appropriate care. RN notified MD that pt wanted to leave AMA.  Pt refused care and signed AMA form. RN DC'd pt's IV and held pressure over dressing. RN instructed pt to continue to hold pressure on site. RN placed AMA form in pt's chart, and pt called out that he was bleeding after he had removed the dressing covering IV removal site. RN assessed and applied another dressing, and instructed pt to call [phone] if site continued to bleed once he left the hospital. Pt walked off the unit.  Riley Kill RN

## 2018-02-15 NOTE — Progress Notes (Signed)
PROGRESS NOTE    Blue Winther  ZOX:[id number] DOB: 1960/10/06 DOA: 02/14/2018 PCP: [name], MD  Outpatient Specialists:     Brief Narrative:  [name] is a 57 y.o. male with medical history significant of alcohol abusein remission,HBV, HCV,cirrhosis with ascites,SBP,hypertension, COPD, GERD, anxiety, anemia, polysubstance abuse (tobacco, cocaine and marijuana), CAD, ESRD-HD (MWF),A fib/A flutter not on AC,who presents with abdominal painand SOB.  Patient underwent paracentesis last night.  Potassium is 6 today.  Patient will undergo hemodialysis.  Patient is eager to have repeat paracentesis.  Shortness of breath has resolved.  Leukocytosis is resolving.  Ascitic fluid seems benign.  Patient does not intermittent rapid ventricular response.  Patient is known to have left bundle branch block.  While in RVR, patient's rhythm appears a wide-complex tachycardia, and the nursing staff significantly worried.  Patient remains very stable during such episodes.  We will still go ahead to consult cardiology team so or can be reassured.  I have contacted Dr. Irven Shelling on-call service.  Assessment & Plan:   Principal Problem:   Acute on chronic respiratory failure with hypoxia (HCC) Active Problems:   Alcohol abuse   Hypertensive urgency   Fluid overload   Essential hypertension   COPD (chronic obstructive pulmonary disease) (HCC)   Anemia due to chronic kidney disease   ESRD on dialysis ([place])   Abdominal pain   Hyponatremia   Acute on chronic respiratory failure with hypoxia: mostly likely due to fluid overload.  Hypertensive urgency may have contributed partially.  Patient was initially put on BiPAP and treated with nitroglycerin patch, respiratory distress improved.  Currently is off BiPAP.  Nephrology, Dr. Florene Glen was consulted for dialysis in the morning. - PRN albuterol nebulizers -Nasal cannula oxygen to maintain oxygen saturation above 93% -HD per  renal 02/15/2018: Respiratory failure has resolved.  Abdominal pain: pt had positive peritoneal fluid culture for Staphylococcus species (coagulase-negative) on 02/07/2018, not sure if it is due to conamination or not. Given severe AP and leukocytosis, will start antibiotics for possible SBP.  Diagnostic paracentesis was performed by EDP.  Pending urinalysis. -PRN oxycodone -IV Rocephin - Blood culture. 02/15/2018: For repeat paracentesis.  Undergo hemodialysis.  Atrialflutter: CHA2DS2-VASc Scoreis 2, needs oral anticoagulation, but pt is not good candidate for [facility] due high risk of bleeding 2/2 livercirrhosis and thrombocytopenia.HR 56-120s -tele monitoring -continue caridzem 02/15/2018: Atrial fibrillation with intermittent episodes of rapid ventricular response, but patient remains asymptomatic.  Due to the presence of low bundle branch block, the rhythm appears wide-complex from time to time.  Cardiology consulted, as the nursing staff are worried.  Tobacco abuse  -Nicotine patch  HTN and hypertensive urgency: blood pressure two 8/90, which improved to 166/102 after treated with nitroglycerin patch to ED. -Continue home medications:Cardizem, hydralazine -IV hydralazine prn -Continue to optimize  COPD (chronic obstructive pulmonary disease) ([place]): no wheezing or rhonchi on auscultation. - prn albuterol nebs  Anemia due to chronic kidney disease: hgb stable.Hemoglobin 10.6 -f/u by CBC  ESRD (end stage renal disease) on dialysis (MWF):had HD on Monday. potassium 4.9, bicarbonate 25, creatinine 6.25, BUN 20. Now has fluid overload -Dr. Bjorn Pippin was consulted for HD in AM 02/15/2018: Patient is currently undergoing hemodialysis.  Liver cirrhosis (HCC):Mental status normal -continue home lactulose 10 mg twice daily -check ammonia level -02/15/2018: Repeat paracentesis  Hyponatremia:  Na 127.   Likely multifactorial  Mental  status normal. Hopefully, will improve with  hemodialysis.   DVT ppx: SCD Code Status: Full code Family Communication: None  at bed side.     Disposition Plan:  Anticipate discharge back to previous home environment Consults called:  Renal Dr. Florene Glen Admission status:  SDU/inpation       Consultants:   Nephrology  Cardiology (consult called into the on-call service, awaiting a callback)  Procedures:   Paracentesis  Currently undergoing hemodialysis  Antimicrobials:   Rocephin   Subjective: Patient is asking for pain medication. No fever or chills. No shortness of breath or chest pain. Ascitic fluid is draining from the point where paracentesis was undertaken.  Objective: Vitals:   02/15/18 1530 02/15/18 1600 02/15/18 1630 02/15/18 1700  BP: (!) 149/83 (!) 143/82 140/61 (!) 156/72  Pulse: (!) 104 100 95 (!) 122  Resp:      Temp:      TempSrc:      SpO2:      Weight:      Height:        Intake/Output Summary (Last 24 hours) at 02/15/2018 1720 Last data filed at 02/14/2018 2230 Gross per 24 hour  Intake -  Output 19 ml  Net -19 ml   Filed Weights   02/15/18 0117 02/15/18 1400  Weight: 71.7 kg (158 lb 1.1 oz) 73.3 kg (161 lb 9.6 oz)    Examination:  General exam: Not in any distress.  Appears calm and comfortable  Respiratory system: Clear to auscultation. Respiratory effort normal. Cardiovascular system: S1 & S2 heard. Gastrointestinal system: Abdomen is distended, with shifting dullness.  Organs are difficult to assess.  Ascitic fluid drainage from the point of prior paracentesis.   Central nervous system: Alert and oriented. No focal neurological deficits. Extremities: Symmetric 5 x 5 power.   Data Reviewed: I have personally reviewed following labs and imaging studies  CBC: Recent Labs  Lab 02/13/18 1722 02/14/18 1919 02/15/18 0023 02/15/18 1205  WBC 8.9 12.5* 12.1* 11.3*  NEUTROABS  --  11.3*  --   --   HGB 11.7* 10.6* 11.6* 11.3*  HCT 34.1* 30.9* 33.6* 32.3*  MCV 78.8 79.6 79.1  78.0  PLT 114* 101* 103* 762*   Basic Metabolic Panel: Recent Labs  Lab 02/13/18 1722 02/14/18 1919 02/15/18 0023 02/15/18 1205  NA 129* 127* 128* 125*  K 4.3 4.9 5.4* 6.1*  CL 87* 88* 86* 85*  CO2 28 25 25 24   GLUCOSE 94 138* 155* 131*  BUN 11 20 19  26*  CREATININE 5.10* 6.62* 6.84* 7.29*  CALCIUM 8.0* 7.6* 8.4* 8.2*  PHOS  --   --   --  6.0*   GFR: Estimated Creatinine Clearance: 11.4 mL/min (A) (by C-G formula based on SCr of 7.29 mg/dL (H)). Liver Function Tests: Recent Labs  Lab 02/13/18 1722 02/14/18 1919 02/15/18 1205  AST 30 24  --   ALT 14 12  --   ALKPHOS 164* 161*  --   BILITOT 0.8 0.9  --   PROT 6.6 6.7  --   ALBUMIN 3.6 3.5 3.4*   Recent Labs  Lab 02/13/18 1722  LIPASE 28   Recent Labs  Lab 02/15/18 0023  AMMONIA 28   Coagulation Profile: No results for input(s): INR, PROTIME in the last 168 hours. Cardiac Enzymes: No results for input(s): CKTOTAL, CKMB, CKMBINDEX, TROPONINI in the last 168 hours. BNP (last 3 results) No results for input(s): PROBNP in the last 8760 hours. HbA1C: No results for input(s): HGBA1C in the last 72 hours. CBG: No results for input(s): GLUCAP in the last 168 hours. Lipid Profile: No results for  input(s): CHOL, HDL, LDLCALC, TRIG, CHOLHDL, LDLDIRECT in the last 72 hours. Thyroid Function Tests: No results for input(s): TSH, T4TOTAL, FREET4, T3FREE, THYROIDAB in the last 72 hours. Anemia Panel: No results for input(s): VITAMINB12, FOLATE, FERRITIN, TIBC, IRON, RETICCTPCT in the last 72 hours. Urine analysis:    Component Value Date/Time   COLORURINE YELLOW 07/16/2011 1619   APPEARANCEUR CLEAR 07/16/2011 1619   LABSPEC 1.018 07/16/2011 1619   PHURINE 7.5 07/16/2011 1619   GLUCOSEU 250 (A) 07/16/2011 1619   HGBUR MODERATE (A) 07/16/2011 1619   BILIRUBINUR SMALL (A) 07/16/2011 1619   KETONESUR NEGATIVE 07/16/2011 1619   PROTEINUR >300 (A) 07/16/2011 1619   UROBILINOGEN 1.0 07/16/2011 1619   NITRITE NEGATIVE  07/16/2011 1619   LEUKOCYTESUR SMALL (A) 07/16/2011 1619   Sepsis Labs: @LABRCNTIP (procalcitonin:4,lacticidven:4)  ) Recent Results (from the past 240 hour(s))  Culture, body fluid-bottle     Status: Abnormal (Preliminary result)   Collection Time: 02/07/18  2:48 PM  Result Value Ref Range Status   Specimen Description FLUID PLEURAL  Final   Special Requests BOTTLES DRAWN AEROBIC AND ANAEROBIC  Final   Gram Stain   Final    GRAM POSITIVE COCCI IN CLUSTERS ANAEROBIC BOTTLE ONLY CRITICAL RESULT CALLED TO, READ BACK BY AND VERIFIED WITH: DR Tyrone Nine [id number]  0936 MLM    Culture (A)  Final    STAPHYLOCOCCUS SPECIES (COAGULASE NEGATIVE) Sent to [place] for further susceptibility testing. Performed at [facility], [place] [address], [place], [place] [address]    Report Status PENDING  Incomplete  Gram stain     Status: None   Collection Time: 02/07/18  2:48 PM  Result Value Ref Range Status   Specimen Description FLUID PLEURAL  Final   Special Requests NONE  Final   Gram Stain   Final    WBC PRESENT,BOTH PMN AND MONONUCLEAR NO ORGANISMS SEEN CYTOSPIN SMEAR Performed at [facility], [address] [place], [place] [address]    Report Status 02/07/2018 FINAL  Final  Susceptibility, Aer + Anaerob     Status: None   Collection Time: 02/07/18  6:21 PM  Result Value Ref Range Status   Suscept, Aer + Anaerob Preliminary report  Final    Comment: (NOTE) Performed At: [facility] [place], [place] [id number] Rush Farmer MD WC:[id number]    Source of Sample PLEURAL  Final    Comment: Performed at [facility], [place] [address], [place], [place] [address]  Susceptibility Result     Status: Abnormal (Preliminary result)   Collection Time: 02/07/18  6:21 PM  Result Value Ref Range Status   Suscept Result 1 Comment (A)  Final    Comment: (NOTE) Staphylococcus capitis Performed At: [facility] [place], [place]  [id number] Rush Farmer MD QP:[id number]    Antimicrobial Suscept PENDING  Incomplete  Culture, blood (routine x 2)     Status: None (Preliminary result)   Collection Time: 02/14/18  6:45 PM  Result Value Ref Range Status   Specimen Description BLOOD LEFT FOREARM  Final   Special Requests   Final    BOTTLES DRAWN AEROBIC AND ANAEROBIC Blood Culture adequate volume   Culture   Final    NO GROWTH < 24 HOURS Performed at [facility], [address] [place], [place] [address]    Report Status PENDING  Incomplete  Culture, blood (routine x 2)     Status: None (Preliminary result)  Collection Time: 02/14/18  7:25 PM  Result Value Ref Range Status   Specimen Description BLOOD LEFT HAND  Final   Special Requests   Final    BOTTLES DRAWN AEROBIC AND ANAEROBIC Blood Culture adequate volume   Culture   Final    NO GROWTH < 24 HOURS Performed at [facility], [address] [place], [place] [address]    Report Status PENDING  Incomplete  Culture, body fluid-bottle     Status: None (Preliminary result)   Collection Time: 02/14/18 11:01 PM  Result Value Ref Range Status   Specimen Description FLUID PERITONEAL  Final   Special Requests BOTTLES DRAWN AEROBIC AND ANAEROBIC  Final   Culture   Final    NO GROWTH < 24 HOURS Performed at [facility], [place] [address], [place], [place] [address]    Report Status PENDING  Incomplete  Gram stain     Status: None   Collection Time: 02/14/18 11:01 PM  Result Value Ref Range Status   Specimen Description FLUID PERITONEAL  Final   Special Requests NONE  Final   Gram Stain   Final    WBC PRESENT, PREDOMINANTLY MONONUCLEAR NO ORGANISMS SEEN CYTOSPIN SMEAR Performed at [facility], [place] [address] [place], [place] [address]    Report Status 02/15/2018 FINAL  Final  MRSA PCR Screening     Status: None   Collection Time: 02/15/18  1:30 AM  Result Value Ref Range Status   MRSA by PCR NEGATIVE NEGATIVE Final    Comment:         The GeneXpert MRSA Assay (FDA approved for NASAL specimens only), is one component of a comprehensive MRSA colonization surveillance program. It is not intended to diagnose MRSA infection nor to guide or monitor treatment for MRSA infections. Performed at [facility], [place] [address], [place], [place] [address]          Radiology Studies: Dg Chest Port 1 View  Result Date: 02/14/2018 CLINICAL DATA:  Respiratory distress.  Current smoker. EXAM: PORTABLE CHEST 1 VIEW COMPARISON:  01/23/2018 FINDINGS: Cardiomegaly with interstitial edema, increased since prior comparison. Right IJ approach dialysis catheter terminates in the right atrium. Moderate aortic atherosclerosis without aneurysmal dilatation noted. No significant effusion. No pulmonary consolidation or pneumothorax. Slight elevation of the left hemidiaphragm. No acute osseous abnormality. IMPRESSION: Cardiomegaly with mild-to-moderate interstitial pulmonary edema, increased since prior. Electronically Signed   By: Ashley Royalty M.D.   On: 02/14/2018 19:10   [place] Radiologist Eval & Mgmt  Result Date: 02/14/2018 Please refer to notes tab for details about interventional procedure. (Op Note)       Scheduled Meds: . calcitRIOL  1 mcg Oral Q M,W,F-HD  . Chlorhexidine Gluconate Cloth  6 each Topical Q0600  . diltiazem  120 mg Oral Daily  . feeding supplement (NEPRO CARB STEADY)  237 mL Oral BID BM  . gabapentin  100 mg Oral BID  . hydrALAZINE  100 mg Oral BID  . lactulose  10 g Oral BID  . nicotine  21 mg Transdermal Daily  . sevelamer carbonate  1,600 mg Oral With snacks  . sevelamer carbonate  3,200 mg Oral TID WC   Continuous Infusions: . sodium chloride    . sodium chloride    . albumin human    . cefTRIAXone (ROCEPHIN)  IV 1 g (02/15/18 0508)     LOS: 1 day    Time spent: 35 Minutes.  Dana Allan, MD  [facility] Pager #: [phone] 7PM-7AM contact night coverage as above

## 2018-02-16 ENCOUNTER — Telehealth: Payer: Self-pay

## 2018-02-16 LAB — SUSCEPTIBILITY RESULT

## 2018-02-16 LAB — SUSCEPTIBILITY, AER + ANAEROB

## 2018-02-17 ENCOUNTER — Emergency Department (HOSPITAL_COMMUNITY)
Admission: EM | Admit: 2018-02-17 | Discharge: 2018-02-17 | Disposition: A | Discharge status: Home / Self Care | Payer: Medicare Other | Attending: Emergency Medicine | Admitting: Emergency Medicine

## 2018-02-17 ENCOUNTER — Other Ambulatory Visit: Payer: Self-pay

## 2018-02-17 ENCOUNTER — Encounter (HOSPITAL_COMMUNITY): Payer: Self-pay

## 2018-02-17 ENCOUNTER — Emergency Department (HOSPITAL_COMMUNITY): Payer: Medicare Other

## 2018-02-17 DIAGNOSIS — I252 Old myocardial infarction: Secondary | ICD-10-CM | POA: Diagnosis not present

## 2018-02-17 DIAGNOSIS — I120 Hypertensive chronic kidney disease with stage 5 chronic kidney disease or end stage renal disease: Secondary | ICD-10-CM | POA: Insufficient documentation

## 2018-02-17 DIAGNOSIS — R0602 Shortness of breath: Secondary | ICD-10-CM

## 2018-02-17 DIAGNOSIS — J449 Chronic obstructive pulmonary disease, unspecified: Secondary | ICD-10-CM | POA: Diagnosis not present

## 2018-02-17 DIAGNOSIS — D631 Anemia in chronic kidney disease: Secondary | ICD-10-CM | POA: Diagnosis not present

## 2018-02-17 DIAGNOSIS — N186 End stage renal disease: Secondary | ICD-10-CM | POA: Diagnosis not present

## 2018-02-17 DIAGNOSIS — F17210 Nicotine dependence, cigarettes, uncomplicated: Secondary | ICD-10-CM | POA: Diagnosis not present

## 2018-02-17 DIAGNOSIS — R000 Tachycardia, unspecified: Secondary | ICD-10-CM | POA: Diagnosis not present

## 2018-02-17 DIAGNOSIS — I2510 Atherosclerotic heart disease of native coronary artery without angina pectoris: Secondary | ICD-10-CM | POA: Diagnosis not present

## 2018-02-17 DIAGNOSIS — R062 Wheezing: Secondary | ICD-10-CM | POA: Diagnosis not present

## 2018-02-17 DIAGNOSIS — I4892 Unspecified atrial flutter: Secondary | ICD-10-CM | POA: Diagnosis not present

## 2018-02-17 DIAGNOSIS — I4891 Unspecified atrial fibrillation: Secondary | ICD-10-CM | POA: Insufficient documentation

## 2018-02-17 DIAGNOSIS — N2581 Secondary hyperparathyroidism of renal origin: Secondary | ICD-10-CM | POA: Diagnosis not present

## 2018-02-17 DIAGNOSIS — I447 Left bundle-branch block, unspecified: Secondary | ICD-10-CM | POA: Diagnosis not present

## 2018-02-17 DIAGNOSIS — D509 Iron deficiency anemia, unspecified: Secondary | ICD-10-CM | POA: Diagnosis not present

## 2018-02-17 LAB — COMPREHENSIVE METABOLIC PANEL
ALT: 13 U/L (ref 0–44)
AST: 24 U/L (ref 15–41)
Albumin: 3.4 g/dL — ABNORMAL LOW (ref 3.5–5.0)
Alkaline Phosphatase: 164 U/L — ABNORMAL HIGH (ref 38–126)
Anion gap: 14 (ref 5–15)
BUN: 14 mg/dL (ref 6–20)
CO2: 24 mmol/L (ref 22–32)
Calcium: 8.1 mg/dL — ABNORMAL LOW (ref 8.9–10.3)
Chloride: 94 mmol/L — ABNORMAL LOW (ref 98–111)
Creatinine, Ser: 4.63 mg/dL — ABNORMAL HIGH (ref 0.61–1.24)
GFR calc Af Amer: 15 mL/min — ABNORMAL LOW (ref >60)
GFR calc non Af Amer: 13 mL/min — ABNORMAL LOW (ref >60)
Glucose, Bld: 136 mg/dL — ABNORMAL HIGH (ref 70–99)
Potassium: 4.5 mmol/L (ref 3.5–5.1)
Sodium: 132 mmol/L — ABNORMAL LOW (ref 135–145)
Total Bilirubin: 0.8 mg/dL (ref 0.3–1.2)
Total Protein: 6.5 g/dL (ref 6.5–8.1)

## 2018-02-17 LAB — CBC WITH DIFFERENTIAL/PLATELET
Abs Immature Granulocytes: 0 10*3/uL (ref 0.0–0.1)
Basophils Absolute: 0.1 10*3/uL (ref 0.0–0.1)
Basophils Relative: 1 %
Eosinophils Absolute: 0.1 10*3/uL (ref 0.0–0.7)
Eosinophils Relative: 1 %
HCT: 33.2 % — ABNORMAL LOW (ref 39.0–52.0)
Hemoglobin: 11.3 g/dL — ABNORMAL LOW (ref 13.0–17.0)
Immature Granulocytes: 0 %
Lymphocytes Relative: 8 %
Lymphs Abs: 0.6 10*3/uL — ABNORMAL LOW (ref 0.7–4.0)
MCH: 26.9 pg (ref 26.0–34.0)
MCHC: 34 g/dL (ref 30.0–36.0)
MCV: 79 fL (ref 78.0–100.0)
Monocytes Absolute: 0.3 10*3/uL (ref 0.1–1.0)
Monocytes Relative: 4 %
Neutro Abs: 6.4 10*3/uL (ref 1.7–7.7)
Neutrophils Relative %: 86 %
Platelets: 106 10*3/uL — ABNORMAL LOW (ref 150–400)
RBC: 4.2 MIL/uL — ABNORMAL LOW (ref 4.22–5.81)
RDW: 15.8 % — ABNORMAL HIGH (ref 11.5–15.5)
WBC: 7.5 10*3/uL (ref 4.0–10.5)

## 2018-02-17 IMAGING — CR DG CHEST 1V PORT
1 series · 1 of 1 positions shown · non-contrast
Comparison: 08/16/2016

CLINICAL DATA: Follow-up pulmonary edema

EXAM:
PORTABLE CHEST 1 VIEW

[AP]
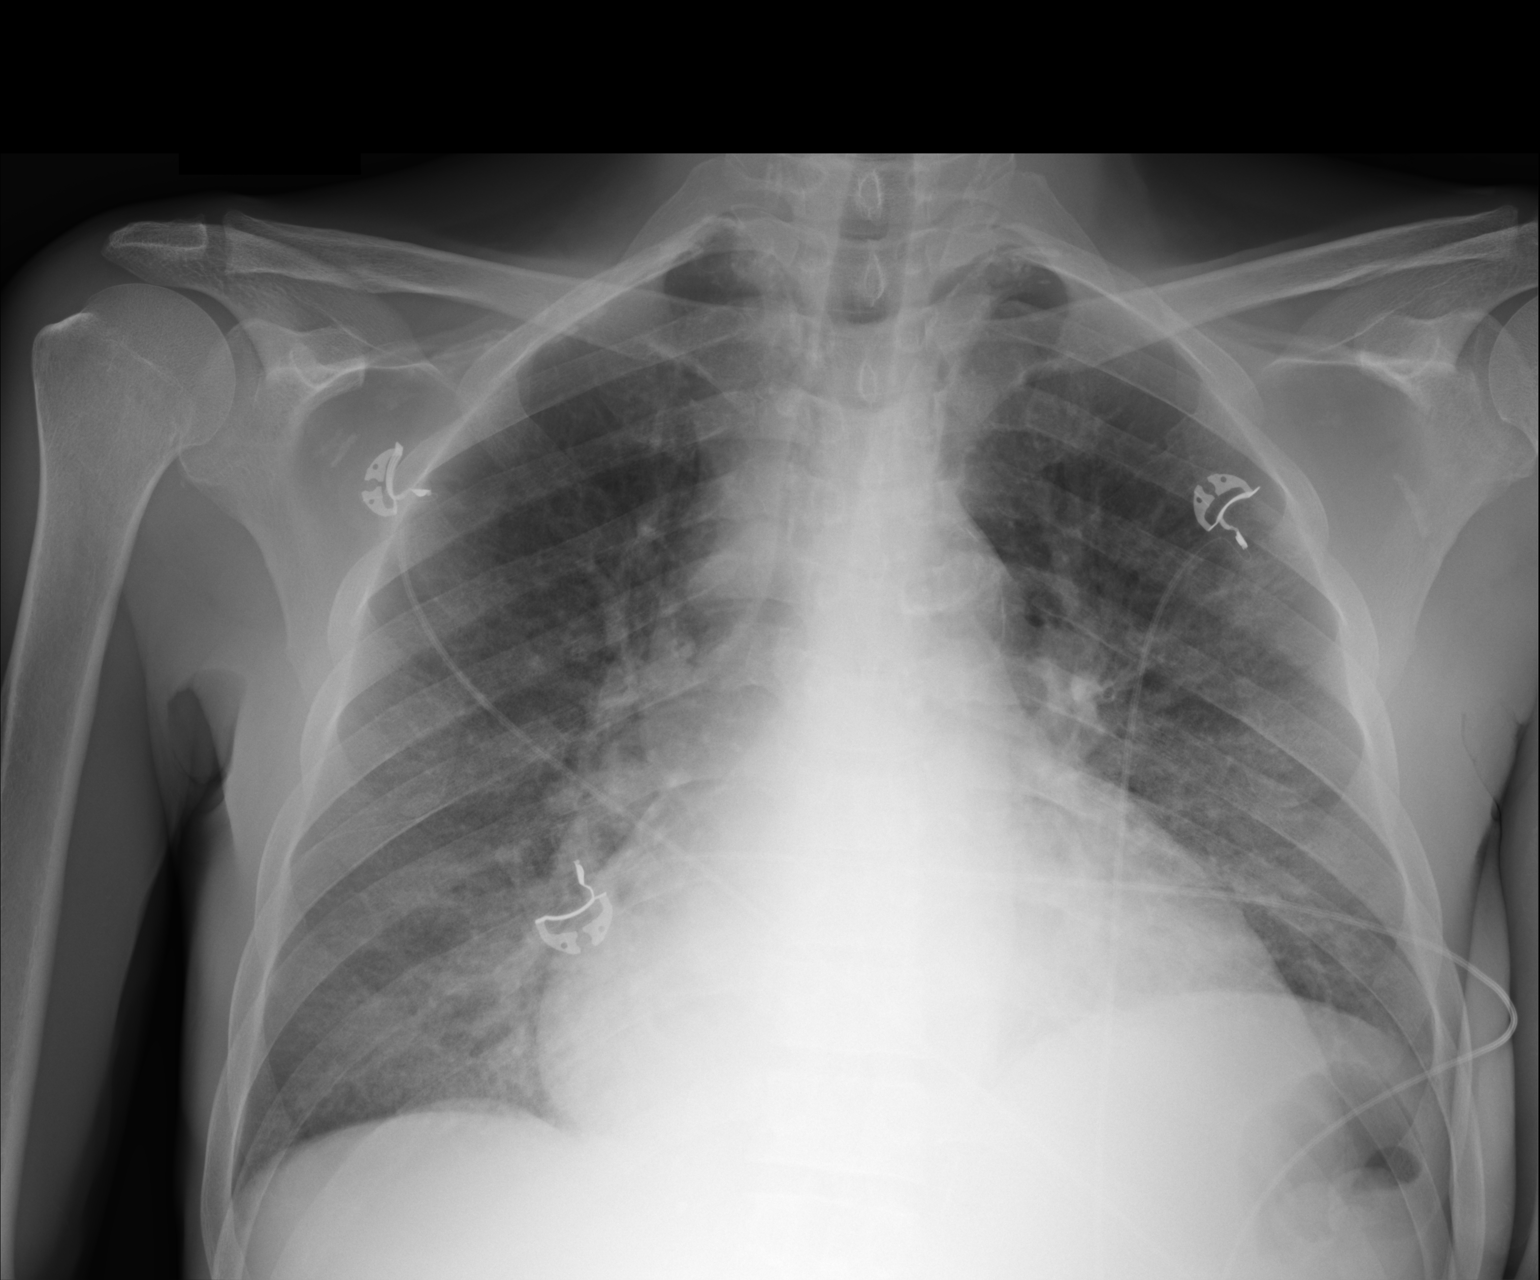

[1 of 1 positions shown; findings below may reference images not displayed]

FINDINGS: Cardiac shadow is enlarged but stable. Mild vascular congestion is
again seen although the overall degree of aeration has improved
consistent with some resolution of alveolar edema. No focal
infiltrate or sizable effusion is noted.
IMPRESSION: Persistent CHF although improved from the prior study.

## 2018-02-17 MED ORDER — ALBUTEROL SULFATE (2.5 MG/3ML) 0.083% IN NEBU
5.0000 mg | INHALATION_SOLUTION | Freq: Once | RESPIRATORY_TRACT | Status: AC
  Administered 2018-02-17: 5 mg via RESPIRATORY_TRACT
  Filled 2018-02-17: qty 6

## 2018-02-17 MED ORDER — OXYCODONE HCL 5 MG PO TABS
10.0000 mg | ORAL_TABLET | Freq: Once | ORAL | Status: AC
  Administered 2018-02-17: 10 mg via ORAL
  Filled 2018-02-17: qty 2

## 2018-02-17 NOTE — ED Triage Notes (Signed)
Here from home for SOB, was given 5mg  albuterol via nebulizer by FD, 5mg  abuterol by [facility], douneb via nebulizer, and 125 solumedrol. Wheezing not resolved but 100% on nebulizer tx MWF dyalsis pt, dialyzed today with no complications, stomach distended with fluid scheduled to be removed Tuesday.  Ambulatory, walked into room from EMS stretcher in hallway. 18 in L FA placed by [facility], grafts no longer active, is dyalized via PICC line in R chest

## 2018-02-17 NOTE — ED Triage Notes (Signed)
Pt is in Afib and has Hx of same

## 2018-02-17 NOTE — Telephone Encounter (Signed)
Pt said he is returning your call 

## 2018-02-17 NOTE — ED Notes (Signed)
Pt back from X-ray.  

## 2018-02-17 NOTE — ED Notes (Signed)
Patient transported to X-ray 

## 2018-02-17 NOTE — Telephone Encounter (Signed)
Left message for pt to call back.  Pt calling requesting a para. According to records pt had para done in hospital 02/14/18, he states the fluid has already come back. Pt saw Dr. Kathlene Cote and is not a candidate for TIPS. Pt would like to have para on Tuesday. Please advise.

## 2018-02-17 NOTE — ED Provider Notes (Signed)
Emergency Department Provider Note   I have reviewed the triage vital signs and the nursing notes.   HISTORY  Chief Complaint Shortness of Breath   HPI Laurie Lovejoy is a 57 y.o. male who does not help give much history but states he is here for shortness of breath is been going on for "well over a week".  States his abdomen is distended as well.  Reviewed the records patient was just admitted to the hospital for respirator distress secondary to ascites and had a paracentesis.  He has a paracentesis scheduled for next week as well.  States he is not having worsening cough or fever.  No rashes.  No lower extremity swelling. Patient had multiple doses of albuterol prior to arrival here.  Also with Solu-Medrol. No other associated or modifying symptoms.    Past Medical History:  Diagnosis Date  . Anemia   . Anxiety   . Arthritis    left shoulder  . Atherosclerosis of aorta ([place])   . Cardiomegaly   . Chest pain    DATE UNKNOWN, C/O PERIODICALLY  . Cocaine abuse ([place])   . COPD exacerbation ([place]) 08/17/2016  . Coronary artery disease    stent 02/22/17  . ESRD (end stage renal disease) on dialysis ([place])    "[name]; MWF" (07/04/2017)  . GERD (gastroesophageal reflux disease)    DATE UNKNOWN  . Hemorrhoids   . Hepatitis B, chronic ([place])   . Hepatitis C   . History of kidney stones   . Hyperkalemia   . Hypertension   . Metabolic bone disease    Patient denies  . Mitral stenosis   . Myocardial infarction ([place])   . Pneumonia   . Pulmonary edema   . Solitary rectal ulcer syndrome 07/2017   at flex [facility] for rectal bleeding  . Tubular adenoma of colon     Patient Active Problem List   Diagnosis Date Noted  . Benign neoplasm of cecum   . Benign neoplasm of ascending colon   . Benign neoplasm of descending colon   . Benign neoplasm of rectum   . Atrial fibrillation ([place]) 01/23/2018  . Hx of colonic polyps 01/20/2018  . ESRD (end stage renal disease) ([place])  11/21/2017  . GERD (gastroesophageal reflux disease) 11/16/2017  . Liver cirrhosis ([place]) 11/15/2017  . DNR (do not resuscitate)   . Palliative care by specialist   . Hyponatremia 11/04/2017  . SBP (spontaneous bacterial peritonitis) ([place]) 10/30/2017  . Liver disease, chronic 10/30/2017  . SOB (shortness of breath)   . Abdominal pain 10/28/2017  . Upper airway cough syndrome with flattening on f/v loop 10/13/17 c/w vcd 10/17/2017  . Elevated diaphragm 10/13/2017  . Ileus ([place]) 09/29/2017  . Malnutrition of moderate degree 09/29/2017  . Sinus congestion 09/03/2017  . Symptomatic anemia 09/02/2017  . Other cirrhosis of liver ([place]) 09/02/2017  . Left bundle branch block 09/02/2017  . Mitral stenosis 09/02/2017  . Hematochezia 07/15/2017  . Wide-complex tachycardia ()   . Endotracheally intubated   . ESRD on dialysis ([place]) 07/04/2017  . Acute respiratory failure with hypoxia ([place]) 06/18/2017  . CKD (chronic kidney disease) stage V requiring chronic dialysis ([place]) 06/18/2017  . History of Cocaine abuse ([place]) 06/18/2017  . Hypertension 06/18/2017  . Infection of AV graft for dialysis ([place]) 06/18/2017  . Anxiety 06/18/2017  . Anemia due to chronic kidney disease 06/18/2017  . Atrial flutter with rapid ventricular response ([place]) 06/18/2017  . Personality disorder ([place]) 06/13/2017  .  Cellulitis 06/12/2017  . Adjustment disorder with mixed anxiety and depressed mood 06/10/2017  . Suicidal ideation 06/10/2017  . Arm wound, left, sequela 06/10/2017  . Dyspnea on exertion 05/29/2017  . Tachycardia 05/29/2017  . Hyperkalemia 05/22/2017  . Acute metabolic encephalopathy   . Anemia 04/23/2017  . Ascites 04/23/2017  . COPD (chronic obstructive pulmonary disease) ([place]) 04/23/2017  . Acute on chronic respiratory failure with hypoxia ([place]) 03/25/2017  . Arrhythmia 03/25/2017  . COPD GOLD 0 with flattening on inps f/v  09/27/2016  . Essential hypertension 09/27/2016  . Fluid overload  08/30/2016  . COPD exacerbation ([place]) 08/17/2016  . Hypertensive urgency 08/17/2016  . Respiratory failure ([place]) 08/17/2016  . Problem with dialysis access ([place]) 07/23/2016  . Chronic hepatitis B ([place]) 03/05/2014  . Chronic hepatitis C without hepatic coma ([place]) 03/05/2014  . Internal hemorrhoids with bleeding, swelling and itching 03/05/2014  . Thrombocytopenia ([place]) 03/05/2014  . Chest pain 02/27/2014  . Alcohol abuse 04/14/2009  . Cigarette smoker 04/14/2009  . GANGLION CYST 04/14/2009    Past Surgical History:  Procedure Laterality Date  . A/V FISTULAGRAM Left 05/26/2017   Procedure: A/V FISTULAGRAM;  Surgeon: Conrad Hendrum, MD;  Location: [place] [facility];  Service: Cardiovascular;  Laterality: Left;  . A/V FISTULAGRAM Right 11/18/2017   Procedure: A/V FISTULAGRAM - Right Arm;  Surgeon: Elam Dutch, MD;  Location: [place] [facility];  Service: Cardiovascular;  Laterality: Right;  . APPLICATION OF WOUND VAC Left 06/14/2017   Procedure: APPLICATION OF WOUND VAC;  Surgeon: Katha Cabal, MD;  Location: [facility];  Service: Vascular;  Laterality: Left;  . AV FISTULA PLACEMENT  2012   BELIEVED WAS PLACED IN JUNE  . AV FISTULA PLACEMENT Right 08/09/2017   Procedure: Creation Right arm ARTERIOVENOUS BRACHIOCEPOHALIC FISTULA;  Surgeon: Elam Dutch, MD;  Location: [facility];  Service: Vascular;  Laterality: Right;  . AV FISTULA PLACEMENT Right 11/22/2017   Procedure: INSERTION OF ARTERIOVENOUS (AV) GORE-TEX GRAFT RIGHT UPPER ARM;  Surgeon: Elam Dutch, MD;  Location: [place];  Service: Vascular;  Laterality: Right;  . BIOPSY  01/25/2018   Procedure: BIOPSY;  Surgeon: Jerene Bears, MD;  Location: [facility];  Service: Gastroenterology;;  . COLONOSCOPY    . COLONOSCOPY WITH PROPOFOL N/A 01/25/2018   Procedure: COLONOSCOPY WITH PROPOFOL;  Surgeon: Jerene Bears, MD;  Location: [place];  Service: Gastroenterology;  Laterality: N/A;  . CORONARY STENT INTERVENTION N/A  02/22/2017   Procedure: CORONARY STENT INTERVENTION;  Surgeon: Nigel Mormon, MD;  Location: [place] [facility];  Service: Cardiovascular;  Laterality: N/A;  . ESOPHAGOGASTRODUODENOSCOPY (EGD) WITH PROPOFOL N/A 01/25/2018   Procedure: ESOPHAGOGASTRODUODENOSCOPY (EGD) WITH PROPOFOL;  Surgeon: Jerene Bears, MD;  Location: [place];  Service: Gastroenterology;  Laterality: N/A;  . FLEXIBLE SIGMOIDOSCOPY N/A 07/15/2017   Procedure: FLEXIBLE SIGMOIDOSCOPY;  Surgeon: Carol Ada, MD;  Location: [place];  Service: Endoscopy;  Laterality: N/A;  . HEMORRHOID BANDING    . I&D EXTREMITY Left 06/01/2017   Procedure: IRRIGATION AND DEBRIDEMENT LEFT ARM HEMATOMA WITH LIGATION OF LEFT ARM AV FISTULA;  Surgeon: Elam Dutch, MD;  Location: [place];  Service: Vascular;  Laterality: Left;  . I&D EXTREMITY Left 06/14/2017   Procedure: IRRIGATION AND DEBRIDEMENT EXTREMITY;  Surgeon: Katha Cabal, MD;  Location: [facility];  Service: Vascular;  Laterality: Left;  . INSERTION OF DIALYSIS CATHETER  05/30/2017  . INSERTION OF DIALYSIS CATHETER N/A 05/30/2017   Procedure: INSERTION OF DIALYSIS  CATHETER;  Surgeon: Elam Dutch, MD;  Location: [place];  Service: Vascular;  Laterality: N/A;  . IR PARACENTESIS  08/30/2017  . IR PARACENTESIS  09/29/2017  . IR PARACENTESIS  10/28/2017  . IR PARACENTESIS  11/09/2017  . IR PARACENTESIS  11/16/2017  . IR PARACENTESIS  11/28/2017  . IR PARACENTESIS  12/01/2017  . IR PARACENTESIS  12/06/2017  . IR PARACENTESIS  01/03/2018  . IR PARACENTESIS  01/23/2018  . IR PARACENTESIS  02/07/2018  . IR RADIOLOGIST EVAL & MGMT  02/14/2018  . LEFT HEART CATH AND CORONARY ANGIOGRAPHY N/A 02/22/2017   Procedure: LEFT HEART CATH AND CORONARY ANGIOGRAPHY;  Surgeon: Nigel Mormon, MD;  Location: [place] [facility];  Service: Cardiovascular;  Laterality: N/A;  . LIGATION OF ARTERIOVENOUS  FISTULA Left 07/17/1094   Procedure: Plication of Left Arm Arteriovenous Fistula;  Surgeon:  Elam Dutch, MD;  Location: [place];  Service: Vascular;  Laterality: Left;  . POLYPECTOMY    . POLYPECTOMY  01/25/2018   Procedure: POLYPECTOMY;  Surgeon: Jerene Bears, MD;  Location: [place];  Service: Gastroenterology;;  . REVISON OF ARTERIOVENOUS FISTULA Left 0/45/4098   Procedure: PLICATION OF DISTAL ANEURYSMAL SEGEMENT OF LEFT UPPER ARM ARTERIOVENOUS FISTULA;  Surgeon: Elam Dutch, MD;  Location: [place];  Service: Vascular;  Laterality: Left;  . REVISON OF ARTERIOVENOUS FISTULA Left 07/30/1476   Procedure: Plication of Left Upper Arm Fistula ;  Surgeon: Waynetta Sandy, MD;  Location: [place];  Service: Vascular;  Laterality: Left;  . SKIN GRAFT SPLIT THICKNESS LEG / FOOT Left    SKIN GRAFT SPLIT THICKNESS LEFT ARM DONOR SITE: LEFT ANTERIOR THIGH  . SKIN SPLIT GRAFT Left 07/04/2017   Procedure: SKIN GRAFT SPLIT THICKNESS LEFT ARM DONOR SITE: LEFT ANTERIOR THIGH;  Surgeon: Elam Dutch, MD;  Location: [place];  Service: Vascular;  Laterality: Left;  . THROMBECTOMY W/ EMBOLECTOMY Left 06/05/2017   Procedure: EXPLORATION OF LEFT ARM FOR BLEEDING; OVERSEWED PROXIMAL FISTULA;  Surgeon: Angelia Mould, MD;  Location: [place];  Service: Vascular;  Laterality: Left;  . WOUND EXPLORATION Left 06/03/2017   Procedure: WOUND EXPLORATION WITH WOUND VAC APPLICATION TO LEFT ARM;  Surgeon: Angelia Mould, MD;  Location: [place];  Service: Vascular;  Laterality: Left;    Current Outpatient Rx  . Order #: [id number] Class: Historical Med  . Order #: [id number] Class: Normal  . Order #: [id number] Class: Historical Med  . Order #: [id number] Class: No Print  . Order #: [id number] Class: Historical Med  . Order #: [id number] Class: Historical Med  . Order #: [id number] Class: Historical Med  . Order #: [id number] Class: Historical Med  . Order #: [id number] Class: Normal  . Order #: [id number] Class: Historical Med    Allergies Morphine and related; Aspirin; Clonidine derivatives;  Tramadol; and Tylenol [acetaminophen]  Family History  Problem Relation Age of Onset  . Heart disease Mother   . Lung cancer Mother   . Heart disease Father   . Malignant hyperthermia Father   . COPD Father   . Throat cancer Sister   . Esophageal cancer Sister   . Hypertension Other   . COPD Other   . Colon cancer Neg Hx   . Colon polyps Neg Hx   . Rectal cancer Neg Hx   . Stomach cancer Neg Hx     Social History Social History   Tobacco Use  . Smoking status: Current Every Day Smoker    Packs/day: 0.50    Years: 43.00  Pack years: 21.50    Types: Cigarettes    Start date: 08/13/1973  . Smokeless tobacco: Never Used  Substance Use Topics  . Alcohol use: Not Currently    Frequency: Never    Comment: quit drinking in 2017  . Drug use: Yes    Types: Marijuana    Comment: quit in 2017"    Review of Systems  All other systems negative except as documented in the HPI. All pertinent positives and negatives as reviewed in the HPI. ____________________________________________   PHYSICAL EXAM:  VITAL SIGNS: ED Triage Vitals  Enc Vitals Group     BP --      Pulse Rate 02/17/18 2021 (!) 130     Resp 02/17/18 2021 (!) 2     Temp 02/17/18 2024 98.1 F (36.7 C)     Temp Source 02/17/18 2024 Oral     SpO2 02/17/18 2020 98 %     Weight 02/17/18 2021 148 lb (67.1 kg)     Height 02/17/18 2021 5\' 9"  (1.753 m)     Head Circumference --      Peak Flow --      Pain Score 02/17/18 2021 10     Pain Loc --      Pain Edu? --      Excl. in [place]? --     Constitutional: Alert and oriented. Well appearing and in no acute distress. Eyes: Conjunctivae are normal. PERRL. EOMI. Head: Atraumatic. Nose: No congestion/rhinnorhea. Mouth/Throat: Mucous membranes are moist.  Oropharynx non-erythematous. Neck: No stridor.  No meningeal signs.   Cardiovascular: Normal rate, regular rhythm. Good peripheral circulation. Grossly normal heart sounds.   Respiratory: poor respiratory effort.   No retractions. Lungs CTAB. Gastrointestinal: Soft and nontender. Mild distention but not taut.  Musculoskeletal: No lower extremity tenderness nor edema. No gross deformities of extremities. Neurologic:  Normal speech and language. No gross focal neurologic deficits are appreciated.  Skin:  Skin is warm, dry and intact. No rash noted.   ____________________________________________   LABS (all labs ordered are listed, but only abnormal results are displayed)  Labs Reviewed  CBC WITH DIFFERENTIAL/PLATELET - Abnormal; Notable for the following components:      Result Value   RBC 4.20 (*)    Hemoglobin 11.3 (*)    HCT 33.2 (*)    RDW 15.8 (*)    Platelets 106 (*)    Lymphs Abs 0.6 (*)    All other components within normal limits  COMPREHENSIVE METABOLIC PANEL - Abnormal; Notable for the following components:   Sodium 132 (*)    Chloride 94 (*)    Glucose, Bld 136 (*)    Creatinine, Ser 4.63 (*)    Calcium 8.1 (*)    Albumin 3.4 (*)    Alkaline Phosphatase 164 (*)    GFR calc non Af Amer 13 (*)    GFR calc Af Amer 15 (*)    All other components within normal limits   ____________________________________________  EKG   EKG Interpretation  Date/Time:  Friday February 17 2018 20:24:01 EDT Ventricular Rate:  114 PR Interval:    QRS Duration: 172 QT Interval:  472 QTC Calculation: 535 R Axis:   45 Text Interpretation:  Atrial fibrillation Ventricular tachycardia, unsustained Probable left ventricular hypertrophy Abnormal T, consider ischemia, lateral leads Prolonged QT interval No significant change since last tracing Confirmed by Merrily Pew [phone]) on 02/18/2018 12:34:38 AM       ____________________________________________  RADIOLOGY  Dg Chest 2 View  Result Date: 02/17/2018 CLINICAL DATA:  Shortness of breath tonight EXAM: CHEST - 2 VIEW COMPARISON:  February 14, 2018 FINDINGS: The heart size and mediastinal contours are stable. The heart size is enlarged. Dual lumen  central venous line is identified distal tip in the right atrium. There is interstitial pulmonary edema. There is no focal pneumonia or pleural effusion. The visualized skeletal structures are stable. IMPRESSION: Congestive heart failure. Electronically Signed   By: Abelardo Diesel M.D.   On: 02/17/2018 20:59    ____________________________________________   PROCEDURES  Procedure(s) performed:   Procedures   ____________________________________________   INITIAL IMPRESSION / ASSESSMENT AND PLAN / ED COURSE  Patient in no respiratory distress at all here.  His abdomen is not tight.  He was observed for couple hours with no tachypnea, hypoxia.  Resting comfortably.  Ambulation around the department comfortably without supplemental oxygen or any evident distress.  I feel is stable for discharge at this time.  Can follow-up with his doctors as necessary.  Pertinent labs & imaging results that were available during my care of the patient were reviewed by me and considered in my medical decision making (see chart for details).  ____________________________________________  FINAL CLINICAL IMPRESSION(S) / ED DIAGNOSES  Final diagnoses:  Shortness of breath     MEDICATIONS GIVEN DURING THIS VISIT:  Medications  albuterol (PROVENTIL) (2.5 MG/3ML) 0.083% nebulizer solution 5 mg (5 mg Nebulization Given 02/17/18 2136)  oxyCODONE (Oxy IR/ROXICODONE) immediate release tablet 10 mg (10 mg Oral Given 02/17/18 2313)     NEW OUTPATIENT MEDICATIONS STARTED DURING THIS VISIT:  Discharge Medication List as of 02/17/2018 10:50 PM      Note:  This note was prepared with assistance of Dragon voice recognition software. Occasional wrong-word or sound-a-like substitutions may have occurred due to the inherent limitations of voice recognition software.   Schyler Counsell, Corene Cornea, MD 02/18/18 [phone]

## 2018-02-19 LAB — CULTURE, BODY FLUID W GRAM STAIN -BOTTLE: Culture: NO GROWTH

## 2018-02-19 LAB — CULTURE, BLOOD (ROUTINE X 2)
Culture: NO GROWTH
Culture: NO GROWTH
Special Requests: ADEQUATE
Special Requests: ADEQUATE

## 2018-02-19 NOTE — Telephone Encounter (Signed)
See the comments I send after reading note from Dr. Vernon Prey for repeat LVP up to 6 L with 50 g IV albumin Send for cell count Bactrim 1 DS daily for SBP prophylaxis

## 2018-02-20 ENCOUNTER — Other Ambulatory Visit: Payer: Self-pay

## 2018-02-20 DIAGNOSIS — N186 End stage renal disease: Secondary | ICD-10-CM | POA: Diagnosis not present

## 2018-02-20 DIAGNOSIS — D631 Anemia in chronic kidney disease: Secondary | ICD-10-CM | POA: Diagnosis not present

## 2018-02-20 DIAGNOSIS — N2581 Secondary hyperparathyroidism of renal origin: Secondary | ICD-10-CM | POA: Diagnosis not present

## 2018-02-20 DIAGNOSIS — D509 Iron deficiency anemia, unspecified: Secondary | ICD-10-CM | POA: Diagnosis not present

## 2018-02-20 DIAGNOSIS — R188 Other ascites: Secondary | ICD-10-CM

## 2018-02-20 MED ORDER — SULFAMETHOXAZOLE-TRIMETHOPRIM 800-160 MG PO TABS: 1.0000 | ORAL_TABLET | Freq: Once | ORAL | 6 refills | Status: AC

## 2018-02-20 NOTE — Telephone Encounter (Signed)
Script sent to pharmacy for pt. 

## 2018-02-20 NOTE — Telephone Encounter (Signed)
Pt scheduled to see Alonza Bogus PA 03/07/18@3pm . Pt scheduled for IR para at [facility] 02/21/18@10am , pt to arrive there at 9:45am. Dialysis nurse to let pt know about the appt, message also left for pt. Please advise if pt should be taking Bactrim DS BID or daily.

## 2018-02-20 NOTE — Telephone Encounter (Signed)
He was to take Bactrim 1 DS BID x 7 days for SBP, then once this was completed, then once daily indefinitely

## 2018-02-21 ENCOUNTER — Encounter (HOSPITAL_COMMUNITY): Payer: Self-pay | Admitting: Student

## 2018-02-21 ENCOUNTER — Ambulatory Visit (HOSPITAL_COMMUNITY)
Admission: RE | Admit: 2018-02-21 | Discharge: 2018-02-21 | Disposition: A | Discharge status: Home / Self Care | Payer: Medicare Other | Source: Ambulatory Visit | Attending: Internal Medicine | Admitting: Internal Medicine

## 2018-02-21 DIAGNOSIS — R188 Other ascites: Secondary | ICD-10-CM | POA: Diagnosis not present

## 2018-02-21 DIAGNOSIS — K7460 Unspecified cirrhosis of liver: Secondary | ICD-10-CM | POA: Diagnosis not present

## 2018-02-21 HISTORY — PX: IR PARACENTESIS: IMG2679

## 2018-02-21 LAB — BODY FLUID CELL COUNT WITH DIFFERENTIAL
Eos, Fluid: 0 %
Lymphs, Fluid: 20 %
Monocyte-Macrophage-Serous Fluid: 77 % (ref 50–90)
Neutrophil Count, Fluid: 3 % (ref 0–25)
Total Nucleated Cell Count, Fluid: 270 cu mm (ref 0–1000)

## 2018-02-21 MED ORDER — LIDOCAINE HCL (PF) 2 % IJ SOLN
INTRAMUSCULAR | Status: DC | PRN
  Administered 2018-02-21: 10 mL

## 2018-02-21 MED ORDER — LIDOCAINE HCL (PF) 2 % IJ SOLN
INTRAMUSCULAR | Status: AC
  Filled 2018-02-21: qty 20

## 2018-02-21 MED ORDER — ALBUMIN HUMAN 25 % IV SOLN
50.0000 g | Freq: Once | INTRAVENOUS | Status: AC
  Administered 2018-02-21: 50 g via INTRAVENOUS

## 2018-02-21 MED ORDER — ALBUMIN HUMAN 25 % IV SOLN
INTRAVENOUS | Status: AC
  Filled 2018-02-21: qty 200

## 2018-02-21 NOTE — Procedures (Signed)
PROCEDURE SUMMARY:  Successful US guided paracentesis from right lateral abdomen.  Yielded 1.2 liters of yellow fluid.  No immediate complications.  Pt tolerated well.   Specimen was sent for labs.  Docia Barrier PA-C 02/21/2018 10:57 AM

## 2018-02-22 DIAGNOSIS — N186 End stage renal disease: Secondary | ICD-10-CM | POA: Diagnosis not present

## 2018-02-22 DIAGNOSIS — N2581 Secondary hyperparathyroidism of renal origin: Secondary | ICD-10-CM | POA: Diagnosis not present

## 2018-02-22 DIAGNOSIS — D509 Iron deficiency anemia, unspecified: Secondary | ICD-10-CM | POA: Diagnosis not present

## 2018-02-22 DIAGNOSIS — D631 Anemia in chronic kidney disease: Secondary | ICD-10-CM | POA: Diagnosis not present

## 2018-02-23 LAB — PATHOLOGIST SMEAR REVIEW

## 2018-02-24 DIAGNOSIS — N2581 Secondary hyperparathyroidism of renal origin: Secondary | ICD-10-CM | POA: Diagnosis not present

## 2018-02-24 DIAGNOSIS — D631 Anemia in chronic kidney disease: Secondary | ICD-10-CM | POA: Diagnosis not present

## 2018-02-24 DIAGNOSIS — N186 End stage renal disease: Secondary | ICD-10-CM | POA: Diagnosis not present

## 2018-02-24 DIAGNOSIS — D509 Iron deficiency anemia, unspecified: Secondary | ICD-10-CM | POA: Diagnosis not present

## 2018-02-27 DIAGNOSIS — N2581 Secondary hyperparathyroidism of renal origin: Secondary | ICD-10-CM | POA: Diagnosis not present

## 2018-02-27 DIAGNOSIS — N186 End stage renal disease: Secondary | ICD-10-CM | POA: Diagnosis not present

## 2018-02-27 DIAGNOSIS — D631 Anemia in chronic kidney disease: Secondary | ICD-10-CM | POA: Diagnosis not present

## 2018-02-27 DIAGNOSIS — D509 Iron deficiency anemia, unspecified: Secondary | ICD-10-CM | POA: Diagnosis not present

## 2018-02-27 NOTE — Discharge Summary (Signed)
Physician Discharge Summary  Patient ID: [name] MRN: [id number] DOB/AGE: 1960-10-03 57 y.o.  Admit date: 02/14/2018 Discharge date: 02/15/2018  Admission Diagnoses:  Discharge Diagnoses:  Principal Problem:   Acute on chronic respiratory failure with hypoxia ([place]) Active Problems:   Alcohol abuse   Hypertensive urgency   Fluid overload   Essential hypertension   COPD (chronic obstructive pulmonary disease) (HCC)   Anemia due to chronic kidney disease   ESRD on dialysis [facility])   Abdominal pain   Hyponatremia   Discharged Condition: Patient signed himself out of the hospital [place].  Hospital Course: Joshua Toro Hoskinsis a 57 year old male with past medical history significant foralcohol abusein remission,HBV, HCV,cirrhosis with ascites,SBP,hypertension, COPD, GERD, anxiety, anemia, polysubstance abuse (tobacco, cocaine and marijuana), CAD, ESRD-HD (MWF),A fib/A flutter not on anticoagulation.  Patient was admitted with abdominal pain, abdominal distention and shortness of breath.  Patient underwent paracentesis on presentation.  Hemodialysis and repeat paracentesis were arranged.  Patient came back from hemodialysis and signed himself out of the hospital [place].  Patient is awake, alert and oriented to time, place and person.  Patient is able to receive and process medical information.  Hence, patient has capacity to make medical decisions.  Consults: nephrology.  Cardiology office also contacted, but patient signed himself out of the hospital [place] before he could be seen.  Significant Diagnostic Studies: Paracentesis and hemodialysis.  Discharge Exam: Blood pressure (!) 148/77, pulse (!) 114, temperature (!) 97.4 F (36.3 C), temperature source Oral, resp. rate 18, height 5\' 9"  (1.753 m), weight 70.5 kg, SpO2 92 %.   Disposition: Patient signed himself out of the hospital [place].   Allergies as of 02/15/2018      Reactions   Morphine And Related Other (See Comments)   Stomach pain   Aspirin Other (See Comments)   STOMACH PAIN   Clonidine Derivatives Itching   Tramadol Itching   Tylenol [acetaminophen] Nausea Only   Stomach ache      Medication List    ASK your doctor about these medications   diltiazem 120 MG 24 hr capsule Commonly known as:  CARDIZEM CD Take 1 capsule (120 mg total) by mouth daily.   gabapentin 100 MG capsule Commonly known as:  NEURONTIN Take 100 mg by mouth 2 (two) times daily.   hydrALAZINE 100 MG tablet Commonly known as:  APRESOLINE Take 100 mg by mouth 2 (two) times daily. Taking after HD on Dialysis days   oxyCODONE 5 MG immediate release tablet Commonly known as:  Oxy IR/ROXICODONE Take 5 mg by mouth 2 (two) times daily as needed for pain.   sevelamer carbonate 800 MG tablet Commonly known as:  RENVELA Take 4 tablets with meals  And 2 tablets twice daily with snacks   sulfamethoxazole-trimethoprim 800-160 MG tablet Commonly known as:  BACTRIM DS,SEPTRA DS Take 1 tablet by mouth 2 (two) times daily.   traZODone 150 MG tablet Commonly known as:  DESYREL Take 150 mg by mouth at bedtime as needed for sleep.   VENTOLIN HFA 108 (90 Base) MCG/ACT inhaler Generic drug:  albuterol Inhale 2 puffs into the lungs every 6 (six) hours as needed for wheezing or shortness of breath.        SignedBonnell Public 02/27/2018, 6:08 AM

## 2018-02-28 IMAGING — CR DG CHEST 1V PORT
1 series · 1 of 1 positions shown · non-contrast
Comparison: 08/18/2016.

CLINICAL DATA: Shortness of breath.  End-stage renal disease.

EXAM:
PORTABLE CHEST 1 VIEW

[AP]
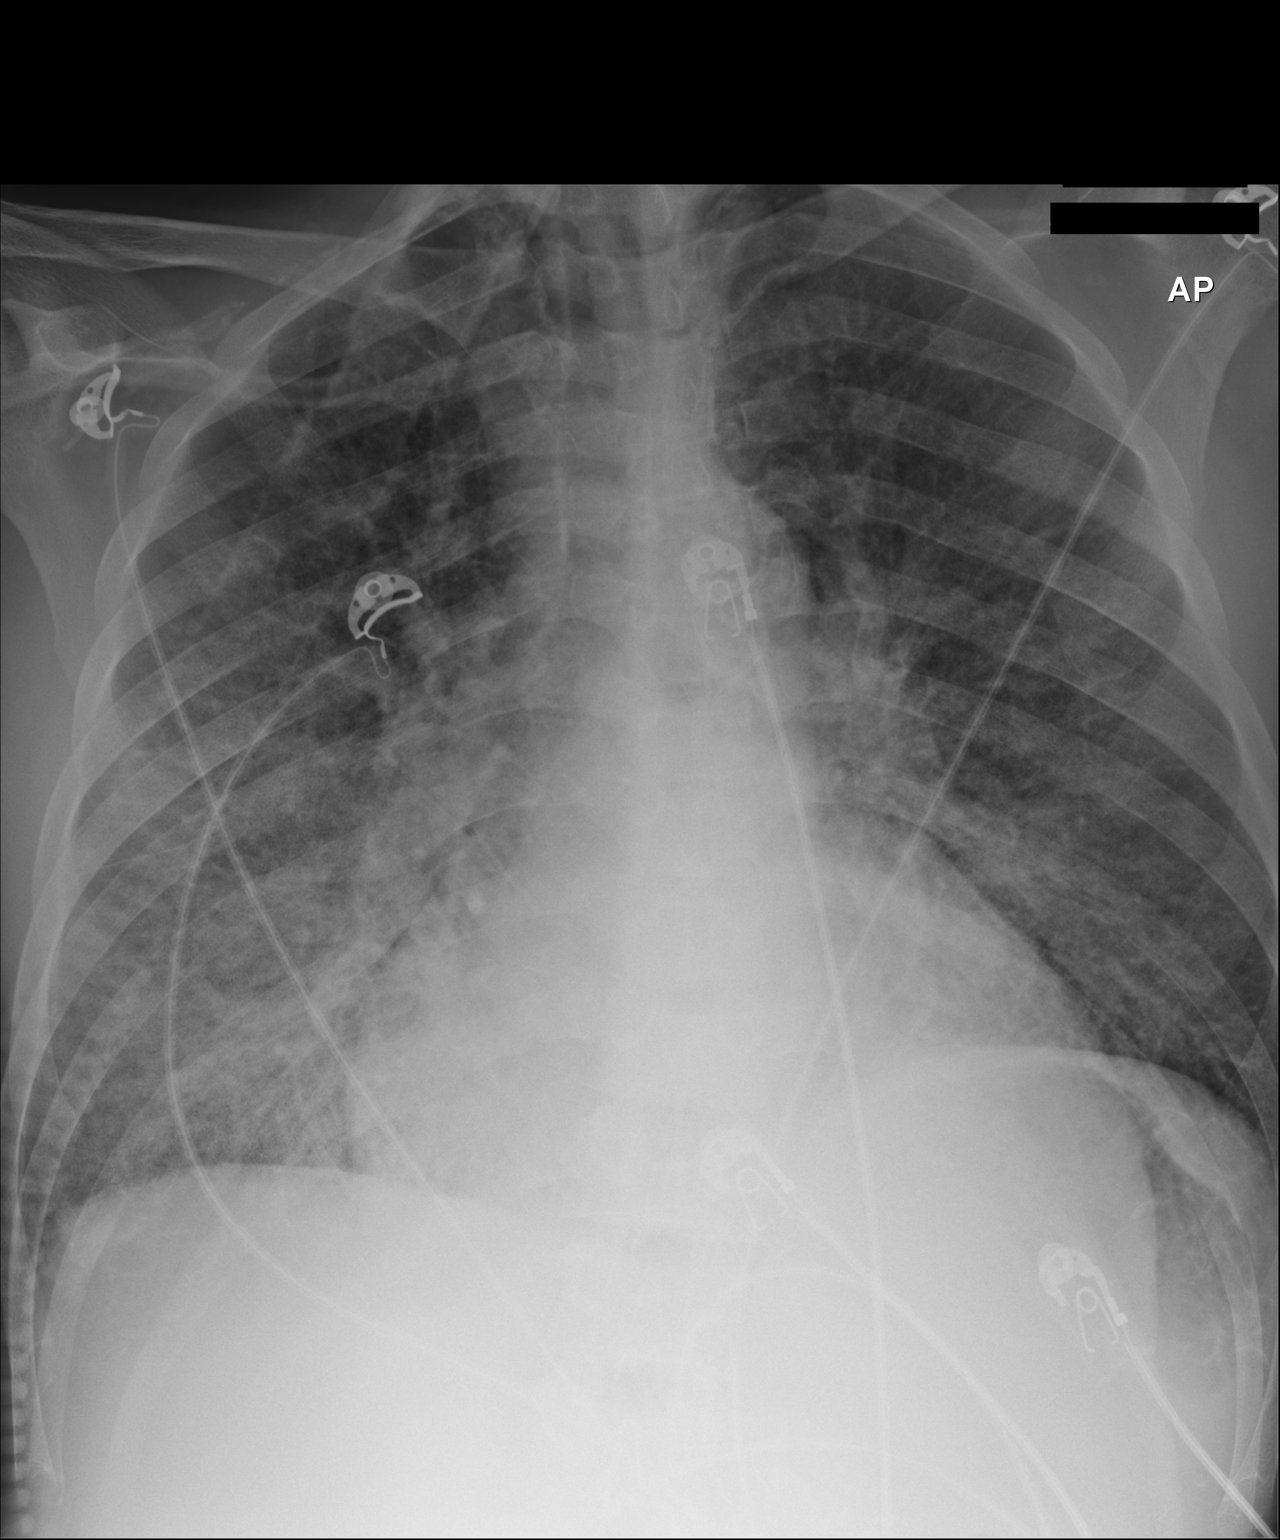

[1 of 1 positions shown; findings below may reference images not displayed]

FINDINGS: 6245 hours. The cardio pericardial silhouette is enlarged. Right
greater than left diffuse airspace disease again noted. The
visualized bony structures of the thorax are intact. Telemetry leads
overlie the chest.
IMPRESSION: Appearance consistent with pulmonary edema.

## 2018-03-01 ENCOUNTER — Telehealth: Payer: Self-pay | Admitting: Gastroenterology

## 2018-03-01 DIAGNOSIS — N2581 Secondary hyperparathyroidism of renal origin: Secondary | ICD-10-CM | POA: Diagnosis not present

## 2018-03-01 DIAGNOSIS — D509 Iron deficiency anemia, unspecified: Secondary | ICD-10-CM | POA: Diagnosis not present

## 2018-03-01 DIAGNOSIS — D631 Anemia in chronic kidney disease: Secondary | ICD-10-CM | POA: Diagnosis not present

## 2018-03-01 DIAGNOSIS — N186 End stage renal disease: Secondary | ICD-10-CM | POA: Diagnosis not present

## 2018-03-01 NOTE — Telephone Encounter (Signed)
Vaughan Basta can you help me with this?  I don't know this pt, Are there orders for q 2 weeks paracentesis in Epic.  Sorry

## 2018-03-02 ENCOUNTER — Other Ambulatory Visit: Payer: Self-pay

## 2018-03-02 DIAGNOSIS — R188 Other ascites: Secondary | ICD-10-CM

## 2018-03-02 NOTE — Telephone Encounter (Signed)
Copy of note sent to Dr. Pearson Grippe.

## 2018-03-02 NOTE — Telephone Encounter (Signed)
Ok, but would expect with such little fluid out that he should be able to wait 14 days between LVPs May have to change to every 10 days He was not a candidate for TIPS (understandably) after consult with IR On HD so cannot pull ascitic fluid with diuretics  Perhaps HD can help here? Low Na diet very important CC: his nephrologist Thanks

## 2018-03-02 NOTE — Telephone Encounter (Signed)
Pt requesting another para. Last one was done 02/21/18 and only 1.5 liters were removed. Please advise.

## 2018-03-02 NOTE — Telephone Encounter (Signed)
Pt scheduled for IR para 03/06/18@9am , pt to arrive there at 8:45am. Pt aware of appt.

## 2018-03-03 DIAGNOSIS — N186 End stage renal disease: Secondary | ICD-10-CM | POA: Diagnosis not present

## 2018-03-03 DIAGNOSIS — D631 Anemia in chronic kidney disease: Secondary | ICD-10-CM | POA: Diagnosis not present

## 2018-03-03 DIAGNOSIS — D509 Iron deficiency anemia, unspecified: Secondary | ICD-10-CM | POA: Diagnosis not present

## 2018-03-03 DIAGNOSIS — N2581 Secondary hyperparathyroidism of renal origin: Secondary | ICD-10-CM | POA: Diagnosis not present

## 2018-03-06 ENCOUNTER — Encounter (HOSPITAL_COMMUNITY): Payer: Self-pay | Admitting: Radiology

## 2018-03-06 ENCOUNTER — Emergency Department (HOSPITAL_COMMUNITY)
Admission: EM | Admit: 2018-03-06 | Discharge: 2018-03-07 | Disposition: A | Discharge status: Home / Self Care | Payer: Medicare Other | Attending: Emergency Medicine | Admitting: Emergency Medicine

## 2018-03-06 ENCOUNTER — Other Ambulatory Visit: Payer: Self-pay

## 2018-03-06 ENCOUNTER — Emergency Department (HOSPITAL_COMMUNITY): Payer: Medicare Other

## 2018-03-06 ENCOUNTER — Ambulatory Visit (HOSPITAL_COMMUNITY)
Admission: RE | Admit: 2018-03-06 | Discharge: 2018-03-06 | Disposition: A | Discharge status: Home / Self Care | Payer: Medicare Other | Source: Ambulatory Visit | Attending: Internal Medicine | Admitting: Internal Medicine

## 2018-03-06 DIAGNOSIS — K7031 Alcoholic cirrhosis of liver with ascites: Secondary | ICD-10-CM | POA: Diagnosis not present

## 2018-03-06 DIAGNOSIS — E875 Hyperkalemia: Secondary | ICD-10-CM | POA: Insufficient documentation

## 2018-03-06 DIAGNOSIS — R188 Other ascites: Secondary | ICD-10-CM

## 2018-03-06 DIAGNOSIS — F17210 Nicotine dependence, cigarettes, uncomplicated: Secondary | ICD-10-CM | POA: Diagnosis not present

## 2018-03-06 DIAGNOSIS — N186 End stage renal disease: Secondary | ICD-10-CM | POA: Diagnosis not present

## 2018-03-06 DIAGNOSIS — J449 Chronic obstructive pulmonary disease, unspecified: Secondary | ICD-10-CM | POA: Insufficient documentation

## 2018-03-06 DIAGNOSIS — R0602 Shortness of breath: Secondary | ICD-10-CM | POA: Insufficient documentation

## 2018-03-06 DIAGNOSIS — I120 Hypertensive chronic kidney disease with stage 5 chronic kidney disease or end stage renal disease: Secondary | ICD-10-CM | POA: Diagnosis not present

## 2018-03-06 DIAGNOSIS — Z992 Dependence on renal dialysis: Secondary | ICD-10-CM | POA: Insufficient documentation

## 2018-03-06 HISTORY — PX: IR PARACENTESIS: IMG2679

## 2018-03-06 LAB — CBC WITH DIFFERENTIAL/PLATELET
Abs Immature Granulocytes: 0 10*3/uL (ref 0.0–0.1)
Basophils Absolute: 0.1 10*3/uL (ref 0.0–0.1)
Basophils Relative: 2 %
Eosinophils Absolute: 0.1 10*3/uL (ref 0.0–0.7)
Eosinophils Relative: 1 %
HCT: 32.2 % — ABNORMAL LOW (ref 39.0–52.0)
Hemoglobin: 11.2 g/dL — ABNORMAL LOW (ref 13.0–17.0)
Immature Granulocytes: 0 %
Lymphocytes Relative: 20 %
Lymphs Abs: 1.3 10*3/uL (ref 0.7–4.0)
MCH: 27.1 pg (ref 26.0–34.0)
MCHC: 34.8 g/dL (ref 30.0–36.0)
MCV: 77.8 fL — ABNORMAL LOW (ref 78.0–100.0)
Monocytes Absolute: 0.6 10*3/uL (ref 0.1–1.0)
Monocytes Relative: 9 %
Neutro Abs: 4.6 10*3/uL (ref 1.7–7.7)
Neutrophils Relative %: 68 %
Platelets: 113 10*3/uL — ABNORMAL LOW (ref 150–400)
RBC: 4.14 MIL/uL — ABNORMAL LOW (ref 4.22–5.81)
RDW: 14.6 % (ref 11.5–15.5)
WBC: 6.8 10*3/uL (ref 4.0–10.5)

## 2018-03-06 LAB — COMPREHENSIVE METABOLIC PANEL
ALT: 10 U/L (ref 0–44)
AST: 19 U/L (ref 15–41)
Albumin: 3.6 g/dL (ref 3.5–5.0)
Alkaline Phosphatase: 110 U/L (ref 38–126)
Anion gap: 15 (ref 5–15)
BUN: 33 mg/dL — ABNORMAL HIGH (ref 6–20)
CO2: 25 mmol/L (ref 22–32)
Calcium: 8.2 mg/dL — ABNORMAL LOW (ref 8.9–10.3)
Chloride: 91 mmol/L — ABNORMAL LOW (ref 98–111)
Creatinine, Ser: 8.97 mg/dL — ABNORMAL HIGH (ref 0.61–1.24)
GFR calc Af Amer: 7 mL/min — ABNORMAL LOW (ref >60)
GFR calc non Af Amer: 6 mL/min — ABNORMAL LOW (ref >60)
Glucose, Bld: 83 mg/dL (ref 70–99)
Potassium: 5.9 mmol/L — ABNORMAL HIGH (ref 3.5–5.1)
Sodium: 131 mmol/L — ABNORMAL LOW (ref 135–145)
Total Bilirubin: 0.8 mg/dL (ref 0.3–1.2)
Total Protein: 6.4 g/dL — ABNORMAL LOW (ref 6.5–8.1)

## 2018-03-06 LAB — BODY FLUID CELL COUNT WITH DIFFERENTIAL
Lymphs, Fluid: 40 %
Monocyte-Macrophage-Serous Fluid: 56 % (ref 50–90)
Neutrophil Count, Fluid: 4 % (ref 0–25)
Total Nucleated Cell Count, Fluid: 201 cu mm (ref 0–1000)

## 2018-03-06 LAB — GRAM STAIN

## 2018-03-06 IMAGING — DX DG CHEST 2V
2 series · 2 of 2 positions shown · non-contrast
Comparison: 08/29/2016

CLINICAL DATA: Pt was here two weeks ago for the same issue: SOB.
Pt also c/o L arm pain reports seeing his cardiologist and was told
the left side of his heart beats stiff and is causing the arm pain.
Medical hx: HTN (takes medication), renal insufficiency.

EXAM:
CHEST  2 VIEW

[w chest pa]
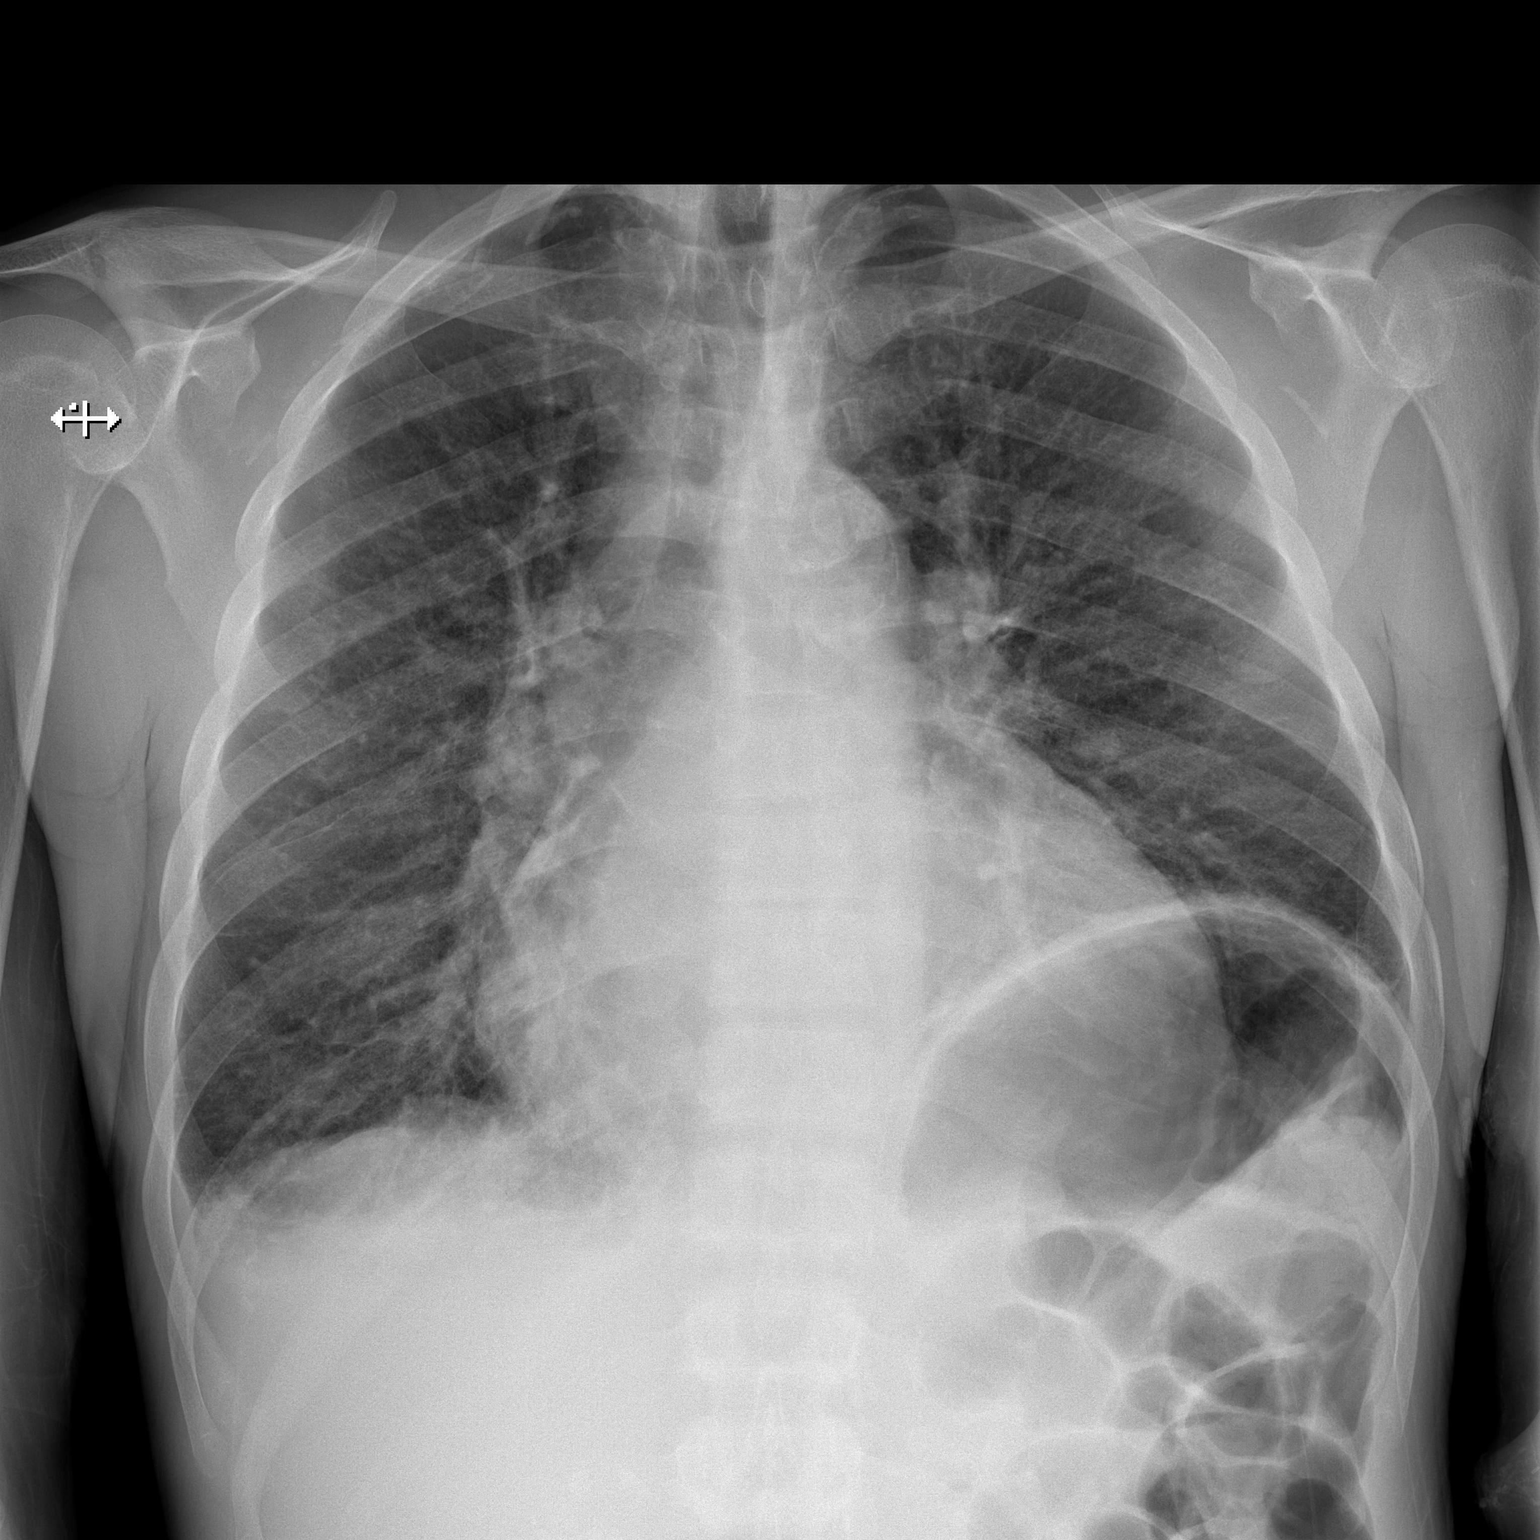

[w chest lat]
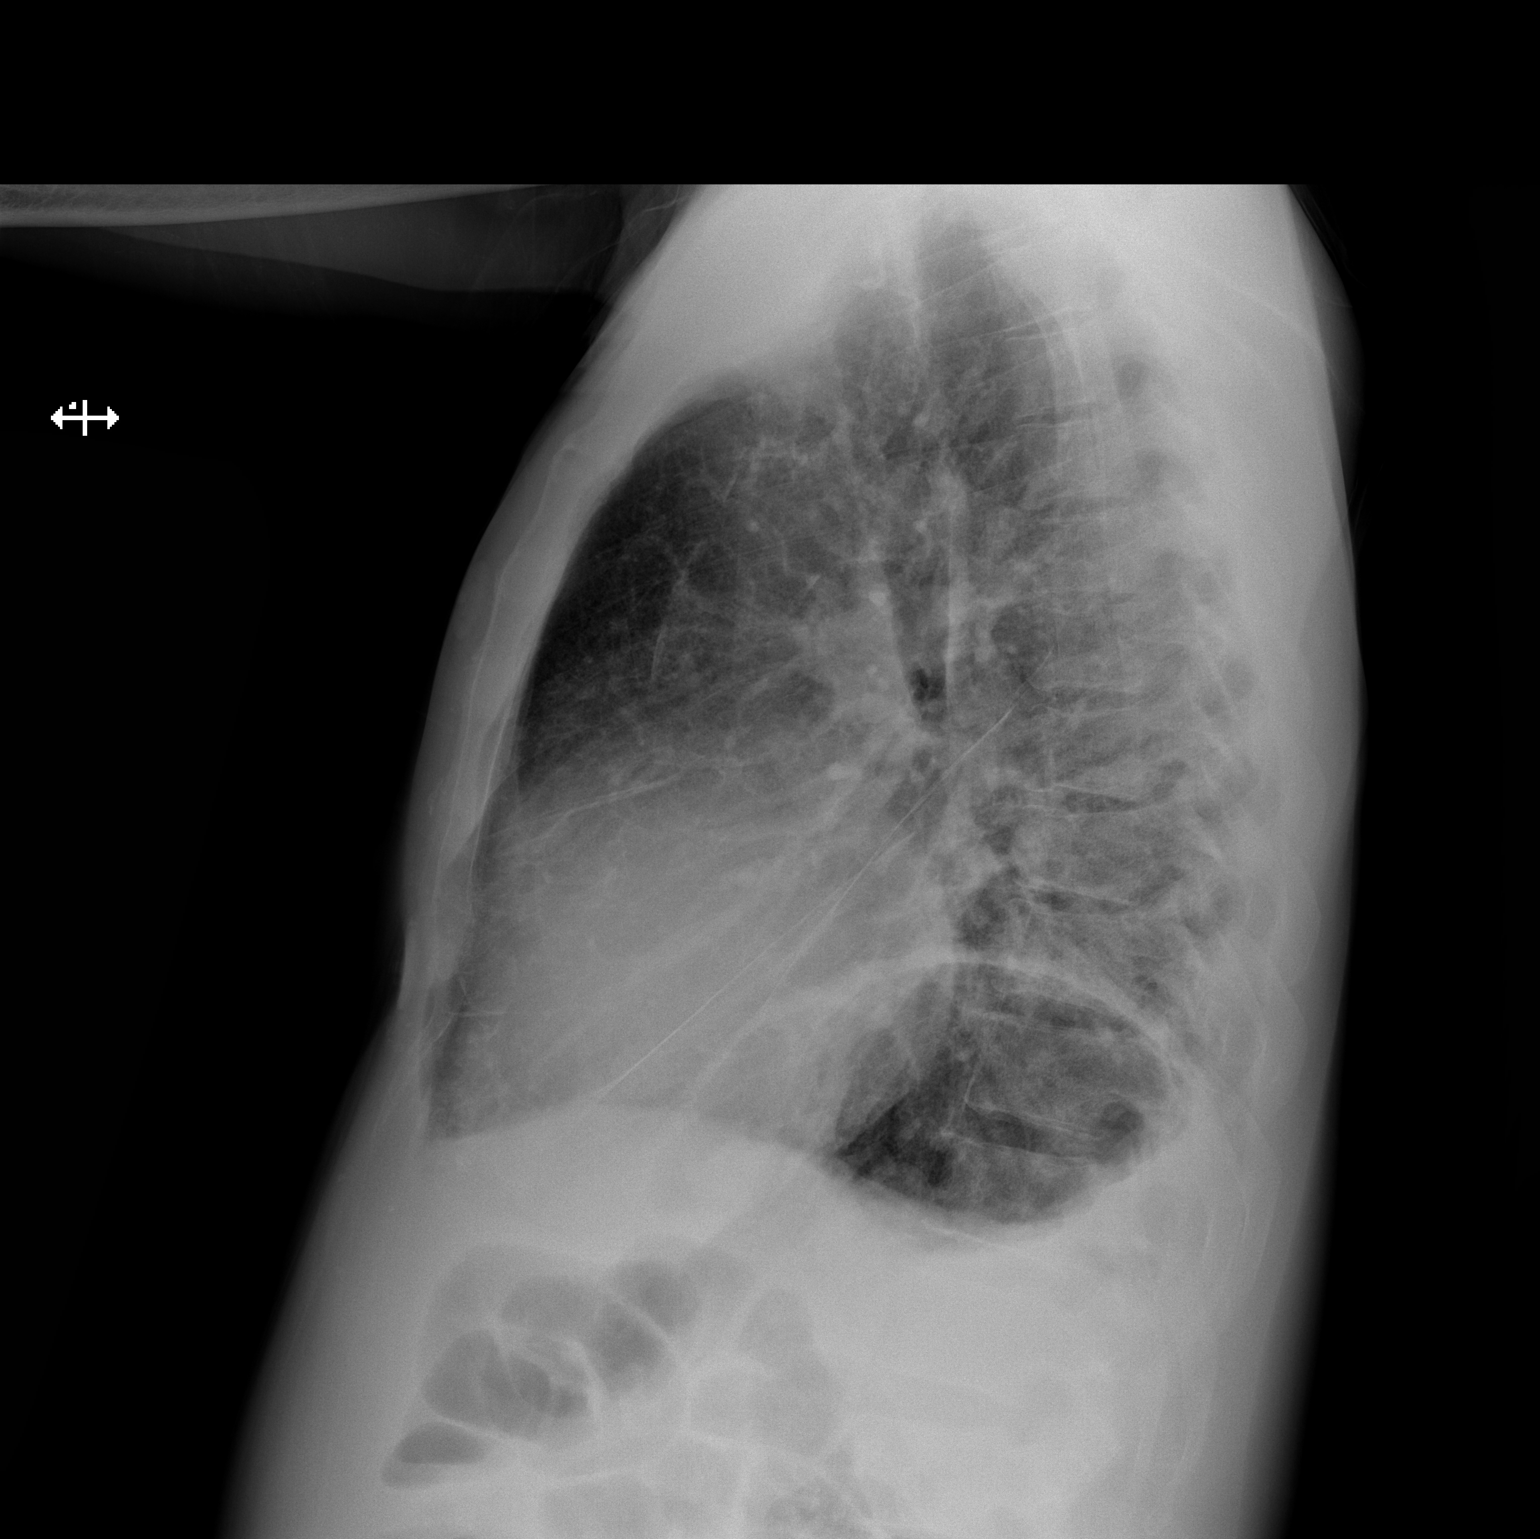

[2 of 2 positions shown; findings below may reference images not displayed]

FINDINGS: The cardiac silhouette is mildly enlarged, unchanged from prior
study. No mediastinal or hilar masses. No convincing adenopathy.

There are mildly thickened interstitial markings that are most
evident in the lower lungs. There is mild thickening of the
fissures. Small pleural effusions are noted. There is additional
basilar opacity consistent with atelectasis. Remainder of the lungs
is clear. No pneumothorax.

Elevation of the left hemidiaphragm is stable.

Skeletal structures are intact.
IMPRESSION: 1. Cardiomegaly with mild interstitial thickening and small pleural
effusions consistent with mild congestive heart failure. Findings
are improved when compared to the prior chest radiograph.

## 2018-03-06 MED ORDER — IPRATROPIUM-ALBUTEROL 0.5-2.5 (3) MG/3ML IN SOLN
3.0000 mL | Freq: Once | RESPIRATORY_TRACT | Status: AC
  Administered 2018-03-06: 3 mL via RESPIRATORY_TRACT
  Filled 2018-03-06: qty 3

## 2018-03-06 MED ORDER — SODIUM POLYSTYRENE SULFONATE 15 GM/60ML PO SUSP
30.0000 g | Freq: Once | ORAL | Status: AC
  Administered 2018-03-06: 30 g via ORAL
  Filled 2018-03-06: qty 120

## 2018-03-06 MED ORDER — OXYCODONE HCL 5 MG PO TABS
5.0000 mg | ORAL_TABLET | Freq: Once | ORAL | Status: AC
  Administered 2018-03-06: 5 mg via ORAL
  Filled 2018-03-06: qty 1

## 2018-03-06 MED ORDER — LIDOCAINE HCL (PF) 2 % IJ SOLN
INTRAMUSCULAR | Status: AC
  Filled 2018-03-06: qty 20

## 2018-03-06 NOTE — ED Notes (Signed)
Pt reports having paracentesis this am.  Became SOB after.  Missed dialysis today.  Denies any cp but reports L side pain.  He is A&Ox 4 and in NAD.

## 2018-03-06 NOTE — ED Provider Notes (Signed)
[facility] EMERGENCY DEPARTMENT Provider Note   CSN: [id number] Arrival date & time: 03/06/18  0998     History   Chief Complaint Chief Complaint  Patient presents with  . Shortness of Breath    HPI [name] is a 57 y.o. male.  [name] is a 57 y.o. Male with history of ESRD on hemodialysis (MWF), hypertension, hepatitis, hyperkalemia, MI and CAD, COPD, who presents to the emergency department for evaluation of shortness of breath.  Patient reports he missed his dialysis treatment today because he was having a paracentesis done this morning where they drew 4 L of fluid off of his abdomen.  Reports he last had dialysis on Friday, and felt short of breath while he was up walking around today.  He denies any chest pain, no cough or fever.  Reports some mild left lower quadrant abdominal pain around the site of his paracentesis.  No nausea, vomiting or diarrhea.  No lower extremity swelling or pain.  The history is provided by the patient.  Shortness of Breath  This is a new problem. The problem occurs intermittently.The current episode started 6 to 12 hours ago. The problem has been gradually improving. Associated symptoms include abdominal pain. Pertinent negatives include no fever, no sore throat, no cough, no chest pain, no vomiting, no rash, no leg pain and no leg swelling. He has tried nothing for the symptoms.    Past Medical History:  Diagnosis Date  . Anemia   . Anxiety   . Arthritis    left shoulder  . Atherosclerosis of aorta ([place])   . Cardiomegaly   . Chest pain    DATE UNKNOWN, C/O PERIODICALLY  . Cocaine abuse ([place])   . COPD exacerbation ([place]) 08/17/2016  . Coronary artery disease    stent 02/22/17  . ESRD (end stage renal disease) on dialysis ([place])    "[name]; MWF" (07/04/2017)  . GERD (gastroesophageal reflux disease)    DATE UNKNOWN  . Hemorrhoids   . Hepatitis B, chronic ([place])   . Hepatitis C   . History of  kidney stones   . Hyperkalemia   . Hypertension   . Metabolic bone disease    Patient denies  . Mitral stenosis   . Myocardial infarction ([place])   . Pneumonia   . Pulmonary edema   . Solitary rectal ulcer syndrome 07/2017   at flex sig for rectal bleeding  . Tubular adenoma of colon     Patient Active Problem List   Diagnosis Date Noted  . Benign neoplasm of cecum   . Benign neoplasm of ascending colon   . Benign neoplasm of descending colon   . Benign neoplasm of rectum   . Atrial fibrillation ([place]) 01/23/2018  . Hx of colonic polyps 01/20/2018  . ESRD (end stage renal disease) ([place]) 11/21/2017  . GERD (gastroesophageal reflux disease) 11/16/2017  . Liver cirrhosis ([place]) 11/15/2017  . DNR (do not resuscitate)   . Palliative care by specialist   . Hyponatremia 11/04/2017  . SBP (spontaneous bacterial peritonitis) ([place]) 10/30/2017  . Liver disease, chronic 10/30/2017  . SOB (shortness of breath)   . Abdominal pain 10/28/2017  . Upper airway cough syndrome with flattening on f/v loop 10/13/17 c/w vcd 10/17/2017  . Elevated diaphragm 10/13/2017  . Ileus ([place]) 09/29/2017  . Malnutrition of moderate degree 09/29/2017  . Sinus congestion 09/03/2017  . Symptomatic anemia 09/02/2017  . Other cirrhosis of liver ([place]) 09/02/2017  . Left  bundle branch block 09/02/2017  . Mitral stenosis 09/02/2017  . Hematochezia 07/15/2017  . Wide-complex tachycardia ([place])   . Endotracheally intubated   . ESRD on dialysis ([place]) 07/04/2017  . Acute respiratory failure with hypoxia ([place]) 06/18/2017  . CKD (chronic kidney disease) stage V requiring chronic dialysis ([place]) 06/18/2017  . History of Cocaine abuse ([place]) 06/18/2017  . Hypertension 06/18/2017  . Infection of AV graft for dialysis ([place]) 06/18/2017  . Anxiety 06/18/2017  . Anemia due to chronic kidney disease 06/18/2017  . Atrial flutter with rapid ventricular response ([place]) 06/18/2017  . Personality disorder ([place]) 06/13/2017  .  Cellulitis 06/12/2017  . Adjustment disorder with mixed anxiety and depressed mood 06/10/2017  . Suicidal ideation 06/10/2017  . Arm wound, left, sequela 06/10/2017  . Dyspnea on exertion 05/29/2017  . Tachycardia 05/29/2017  . Hyperkalemia 05/22/2017  . Acute metabolic encephalopathy   . Anemia 04/23/2017  . Ascites 04/23/2017  . COPD (chronic obstructive pulmonary disease) ([place]) 04/23/2017  . Acute on chronic respiratory failure with hypoxia ([place]) 03/25/2017  . Arrhythmia 03/25/2017  . COPD GOLD 0 with flattening on inps f/v  09/27/2016  . Essential hypertension 09/27/2016  . Fluid overload 08/30/2016  . COPD exacerbation ([place]) 08/17/2016  . Hypertensive urgency 08/17/2016  . Respiratory failure ([place]) 08/17/2016  . Problem with dialysis access ([place]) 07/23/2016  . Chronic hepatitis B ([place]) 03/05/2014  . Chronic hepatitis C without hepatic coma ([place]) 03/05/2014  . Internal hemorrhoids with bleeding, swelling and itching 03/05/2014  . Thrombocytopenia ([place]) 03/05/2014  . Chest pain 02/27/2014  . Alcohol abuse 04/14/2009  . Cigarette smoker 04/14/2009  . GANGLION CYST 04/14/2009    Past Surgical History:  Procedure Laterality Date  . A/V FISTULAGRAM Left 05/26/2017   Procedure: A/V FISTULAGRAM;  Surgeon: Conrad Barbour, MD;  Location: [facility];  Service: Cardiovascular;  Laterality: Left;  . A/V FISTULAGRAM Right 11/18/2017   Procedure: A/V FISTULAGRAM - Right Arm;  Surgeon: Elam Dutch, MD;  Location: [facility];  Service: Cardiovascular;  Laterality: Right;  . APPLICATION OF WOUND VAC Left 06/14/2017   Procedure: APPLICATION OF WOUND VAC;  Surgeon: Katha Cabal, MD;  Location: [facility];  Service: Vascular;  Laterality: Left;  . AV FISTULA PLACEMENT  2012   BELIEVED WAS PLACED IN JUNE  . AV FISTULA PLACEMENT Right 08/09/2017   Procedure: Creation Right arm ARTERIOVENOUS BRACHIOCEPOHALIC FISTULA;  Surgeon: Elam Dutch, MD;  Location: [facility];   Service: Vascular;  Laterality: Right;  . AV FISTULA PLACEMENT Right 11/22/2017   Procedure: INSERTION OF ARTERIOVENOUS (AV) GORE-TEX GRAFT RIGHT UPPER ARM;  Surgeon: Elam Dutch, MD;  Location: [facility];  Service: Vascular;  Laterality: Right;  . BIOPSY  01/25/2018   Procedure: BIOPSY;  Surgeon: Jerene Bears, MD;  Location: [facility];  Service: Gastroenterology;;  . COLONOSCOPY    . COLONOSCOPY WITH PROPOFOL N/A 01/25/2018   Procedure: COLONOSCOPY WITH PROPOFOL;  Surgeon: Jerene Bears, MD;  Location: [facility];  Service: Gastroenterology;  Laterality: N/A;  . CORONARY STENT INTERVENTION N/A 02/22/2017   Procedure: CORONARY STENT INTERVENTION;  Surgeon: Nigel Mormon, MD;  Location: [facility];  Service: Cardiovascular;  Laterality: N/A;  . ESOPHAGOGASTRODUODENOSCOPY (EGD) WITH PROPOFOL N/A 01/25/2018   Procedure: ESOPHAGOGASTRODUODENOSCOPY (EGD) WITH PROPOFOL;  Surgeon: Jerene Bears, MD;  Location: [facility];  Service: Gastroenterology;  Laterality: N/A;  . FLEXIBLE SIGMOIDOSCOPY N/A 07/15/2017   Procedure: FLEXIBLE SIGMOIDOSCOPY;  Surgeon: Carol Ada,  MD;  Location: [facility];  Service: Endoscopy;  Laterality: N/A;  . HEMORRHOID BANDING    . I&D EXTREMITY Left 06/01/2017   Procedure: IRRIGATION AND DEBRIDEMENT LEFT ARM HEMATOMA WITH LIGATION OF LEFT ARM AV FISTULA;  Surgeon: Elam Dutch, MD;  Location: [facility];  Service: Vascular;  Laterality: Left;  . I&D EXTREMITY Left 06/14/2017   Procedure: IRRIGATION AND DEBRIDEMENT EXTREMITY;  Surgeon: Katha Cabal, MD;  Location: [facility] OR[facility];  Service: Vascular;  Laterality: Left;  . INSERTION OF DIALYSIS CATHETER  05/30/2017  . INSERTION OF DIALYSIS CATHETER N/A 05/30/2017   Procedure: INSERTION OF DIALYSIS CATHETER;  Surgeon: Elam Dutch, MD;  Location: [facility];  Service: Vascular;  Laterality: N/A;  . IR PARACENTESIS  08/30/2017  . IR PARACENTESIS  09/29/2017  . IR PARACENTESIS  10/28/2017  . IR PARACENTESIS   11/09/2017  . IR PARACENTESIS  11/16/2017  . IR PARACENTESIS  11/28/2017  . IR PARACENTESIS  12/01/2017  . IR PARACENTESIS  12/06/2017  . IR PARACENTESIS  01/03/2018  . IR PARACENTESIS  01/23/2018  . IR PARACENTESIS  02/07/2018  . IR PARACENTESIS  02/21/2018  . IR PARACENTESIS  03/06/2018  . IR RADIOLOGIST EVAL & MGMT  02/14/2018  . LEFT HEART CATH AND CORONARY ANGIOGRAPHY N/A 02/22/2017   Procedure: LEFT HEART CATH AND CORONARY ANGIOGRAPHY;  Surgeon: Nigel Mormon, MD;  Location: [facility];  Service: Cardiovascular;  Laterality: N/A;  . LIGATION OF ARTERIOVENOUS  FISTULA Left 09/18/7671   Procedure: Plication of Left Arm Arteriovenous Fistula;  Surgeon: Elam Dutch, MD;  Location: [facility];  Service: Vascular;  Laterality: Left;  . POLYPECTOMY    . POLYPECTOMY  01/25/2018   Procedure: POLYPECTOMY;  Surgeon: Jerene Bears, MD;  Location: [facility];  Service: Gastroenterology;;  . REVISON OF ARTERIOVENOUS FISTULA Left 10/28/3788   Procedure: PLICATION OF DISTAL ANEURYSMAL SEGEMENT OF LEFT UPPER ARM ARTERIOVENOUS FISTULA;  Surgeon: Elam Dutch, MD;  Location: [facility];  Service: Vascular;  Laterality: Left;  . REVISON OF ARTERIOVENOUS FISTULA Left 2/40/9735   Procedure: Plication of Left Upper Arm Fistula ;  Surgeon: Waynetta Sandy, MD;  Location: [facility];  Service: Vascular;  Laterality: Left;  . SKIN GRAFT SPLIT THICKNESS LEG / FOOT Left    SKIN GRAFT SPLIT THICKNESS LEFT ARM DONOR SITE: LEFT ANTERIOR THIGH  . SKIN SPLIT GRAFT Left 07/04/2017   Procedure: SKIN GRAFT SPLIT THICKNESS LEFT ARM DONOR SITE: LEFT ANTERIOR THIGH;  Surgeon: Elam Dutch, MD;  Location: [facility];  Service: Vascular;  Laterality: Left;  . THROMBECTOMY W/ EMBOLECTOMY Left 06/05/2017   Procedure: EXPLORATION OF LEFT ARM FOR BLEEDING; OVERSEWED PROXIMAL FISTULA;  Surgeon: Angelia Mould, MD;  Location: [facility];  Service: Vascular;  Laterality: Left;  . WOUND EXPLORATION Left 06/03/2017    Procedure: WOUND EXPLORATION WITH WOUND VAC APPLICATION TO LEFT ARM;  Surgeon: Angelia Mould, MD;  Location: [place] [facility];  Service: Vascular;  Laterality: Left;        Home Medications    Prior to Admission medications   Medication Sig Start Date End Date Taking? Authorizing Provider  albuterol (VENTOLIN HFA) 108 (90 Base) MCG/ACT inhaler Inhale 2 puffs into the lungs every 6 (six) hours as needed for wheezing or shortness of breath.   Yes [provider]  diltiazem (CARDIZEM CD) 120 MG 24 hr capsule Take 1 capsule (120 mg total) by mouth daily. Patient taking differently: Take 120 mg by mouth daily as  needed (for high blood pressure).  06/23/17 06/23/18  ShahmehdiValeria Batman, MD  gabapentin (NEURONTIN) 100 MG capsule Take 100 mg by mouth 2 (two) times daily. 12/13/17   [provider]  hydrALAZINE (APRESOLINE) 100 MG tablet Take 100 mg by mouth 2 (two) times daily. Taking after HD on Dialysis days    [provider]  hydrOXYzine (VISTARIL) 25 MG capsule Take 25-50 mg by mouth at bedtime as needed for anxiety.    [provider]  lactulose (CHRONULAC) 10 GM/15ML solution Take 10 g by mouth 2 (two) times daily.    [provider]  oxyCODONE (OXY IR/ROXICODONE) 5 MG immediate release tablet Take 5 mg by mouth 2 (two) times daily as needed for pain. 12/13/17   [provider]  sevelamer carbonate (RENVELA) 800 MG tablet Take 4 tablets with meals  And 2 tablets twice daily with snacks Patient taking differently: Take 1,600-3,200 mg by mouth See admin instructions. Take 4 tablets (3,200 mg) by mouth with meals and 2 tablets (1,600 mg) with snacks 01/22/18   Patrecia Pour, MD  sulfamethoxazole-trimethoprim (BACTRIM DS,SEPTRA DS) 800-160 MG tablet Take 1 tablet by mouth 2 (two) times daily. 02/09/18   Pyrtle, Lajuan Lines, MD  traZODone (DESYREL) 150 MG tablet Take 150 mg by mouth at bedtime as needed for sleep.    [provider]    Family  History Family History  Problem Relation Age of Onset  . Heart disease Mother   . Lung cancer Mother   . Heart disease Father   . Malignant hyperthermia Father   . COPD Father   . Throat cancer Sister   . Esophageal cancer Sister   . Hypertension Other   . COPD Other   . Colon cancer Neg Hx   . Colon polyps Neg Hx   . Rectal cancer Neg Hx   . Stomach cancer Neg Hx     Social History Social History   Tobacco Use  . Smoking status: Current Every Day Smoker    Packs/day: 0.50    Years: 43.00    Pack years: 21.50    Types: Cigarettes    Start date: 08/13/1973  . Smokeless tobacco: Never Used  Substance Use Topics  . Alcohol use: Not Currently    Frequency: Never    Comment: quit drinking in 2017  . Drug use: Yes    Types: Marijuana    Comment: quit in 2017"     Allergies   Morphine and related; Aspirin; Clonidine derivatives; Tramadol; and Tylenol [acetaminophen]   Review of Systems Review of Systems  Constitutional: Negative for fever.  HENT: Negative for sore throat.   Eyes: Negative for visual disturbance.  Respiratory: Positive for shortness of breath. Negative for cough.   Cardiovascular: Negative for chest pain and leg swelling.  Gastrointestinal: Positive for abdominal pain. Negative for constipation, diarrhea, nausea and vomiting.  Musculoskeletal: Negative for arthralgias and myalgias.  Skin: Negative for color change and rash.  Neurological: Negative for dizziness, syncope and light-headedness.  All other systems reviewed and are negative.    Physical Exam Updated Vital Signs BP 119/75   Pulse 100   Temp 98.5 F (36.9 C) (Oral)   Resp 18   SpO2 96%   Physical Exam  Constitutional: He is oriented to person, place, and time. He appears well-developed and well-nourished.  Non-toxic appearance. He does not appear ill. No distress.  HENT:  Head: Normocephalic and atraumatic.  Mouth/Throat: Oropharynx is clear and moist.  Eyes: Pupils are equal,  round, and reactive to light. EOM are normal. Right eye exhibits no discharge. Left eye exhibits no discharge.  Neck: Normal range of motion. Neck supple.  Cardiovascular: Normal rate, regular rhythm, normal heart sounds and intact distal pulses.  Pulses:      Radial pulses are 2+ on the right side, and 2+ on the left side.       Dorsalis pedis pulses are 2+ on the right side, and 2+ on the left side.       Posterior tibial pulses are 2+ on the right side, and 2+ on the left side.  Pulmonary/Chest: Effort normal. No respiratory distress.  Respirations equal and unlabored, patient able to speak in full sentences, lungs clear to auscultation bilaterally  Abdominal: Soft. Bowel sounds are normal. He exhibits no distension and no mass. There is no tenderness. There is no guarding.  Abdomen soft, nondistended, bandage from prior paracentesis intact with no surrounding erythema, mild tenderness over this site, abdomen is otherwise nontender to palpation without guarding or rebound tenderness  Musculoskeletal:       Right lower leg: Normal. He exhibits no edema.       Left lower leg: Normal. He exhibits no tenderness and no edema.  Neurological: He is alert and oriented to person, place, and time. Coordination normal.  Skin: Skin is warm and dry. Capillary refill takes less than 2 seconds. He is not diaphoretic.  Psychiatric: He has a normal mood and affect. His behavior is normal.  Nursing note and vitals reviewed.    ED Treatments / Results  Labs (all labs ordered are listed, but only abnormal results are displayed) Labs Reviewed  CBC WITH DIFFERENTIAL/PLATELET - Abnormal; Notable for the following components:      Result Value   RBC 4.14 (*)    Hemoglobin 11.2 (*)    HCT 32.2 (*)    MCV 77.8 (*)    Platelets 113 (*)    All other components within normal limits  COMPREHENSIVE METABOLIC PANEL - Abnormal; Notable for the following components:   Sodium 131 (*)    Potassium 5.9 (*)     Chloride 91 (*)    BUN 33 (*)    Creatinine, Ser 8.97 (*)    Calcium 8.2 (*)    Total Protein 6.4 (*)    GFR calc non Af Amer 6 (*)    GFR calc Af Amer 7 (*)    All other components within normal limits    EKG EKG Interpretation  Date/Time:  Monday March 06 2018 23:01:28 EDT Ventricular Rate:  106 PR Interval:    QRS Duration: 174 QT Interval:  432 QTC Calculation: 574 R Axis:   -152 Text Interpretation:  Sinus tachycardia Prolonged PR interval RAE, consider biatrial enlargement Consider left ventricular hypertrophy Prolonged QT interval No significant change since last tracing Confirmed by Dorie Rank [phone]) on 03/06/2018 11:05:11 PM   Radiology Dg Chest 2 View  Result Date: 03/06/2018 CLINICAL DATA:  Shortness of breath, EXAM: CHEST - 2 VIEW COMPARISON:  02/17/2018 FINDINGS: Right dialysis catheter remains in place, unchanged. Cardiomegaly. Slight interstitial prominence, improved since prior study, likely improving interstitial edema. Bibasilar atelectasis. No effusions or acute bony abnormality. IMPRESSION: Improving interstitial edema pattern.  Bibasilar atelectasis. Electronically Signed   By: Rolm Baptise M.D.   On: 03/06/2018 19:55    Procedures Procedures (including critical care time)  Medications Ordered in ED Medications  ipratropium-albuterol (DUONEB) 0.5-2.5 (3) MG/3ML nebulizer solution 3 mL (  3 mLs Nebulization Given 03/06/18 1919)  oxyCODONE (Oxy IR/ROXICODONE) immediate release tablet 5 mg (5 mg Oral Given 03/06/18 2249)  sodium polystyrene (KAYEXALATE) 15 GM/60ML suspension 30 g (30 g Oral Given 03/06/18 2250)     Initial Impression / Assessment and Plan / ED Course  I have reviewed the triage vital signs and the nursing notes.  Pertinent labs & imaging results that were available during my care of the patient were reviewed by me and considered in my medical decision making (see chart for details).  Patient presents for evaluation of shortness of breath  after missing his dialysis treatment today.  Had a paracentesis done and is having some pain in the left lower quadrant of the abdomen where procedure was completed.  On arrival he has normal vitals and is in no acute distress.  On exam lungs are clear with slightly diminished lung sounds at the bases, and patient has no hypoxia or tachypnea.  He denies any chest pain, abdominal exam is benign with minimal tenderness directly over paracentesis site, no surrounding erythema.  EKG, basic labs and chest x-ray will be checked given patient has not had dialysis in 3 days.  EKG without concerning changes, no peaked T waves.  Patient has minimal hyponatremia at 131, potassium is elevated at 5.9, creatinine of 8.97.  Anemia is at baseline and there is no leukocytosis.  Chest x-ray shows improving interstitial edema with bibasilar atelectasis.  Labs are overall borderline and patient is not acutely symptomatic at this time, he was given DuoNeb which seemed to improve his shortness of breath prior to my evaluation.  Will discuss with nephrology given borderline potassium regarding dialysis.  10:10 PM Case discussed with Dr. Florene Glen with nephrology, given stable vitals and improving symptoms feels patient is stable for discharge with dose of Kayexalate to help with potassium, and patient is to contact his dialysis center tomorrow to arrange for dialysis tomorrow.  Patient is in agreement with this plan.  Patient given 1 dose of his home pain medicine.  He is eating and drinking without difficulty.  At this time there does not appear to be any evidence of an acute emergency medical condition and the patient appears stable for discharge with appropriate outpatient follow up.Diagnosis was discussed with patient who verbalizes understanding and is agreeable to discharge. Pt case discussed with Dr. Tomi Bamberger who agrees with my plan.    Final Clinical Impressions(s) / ED Diagnoses   Final diagnoses:  SOB (shortness of breath)   Hyperkalemia  ESRD on hemodialysis [facility])    ED Discharge Orders    None       Janet Berlin 03/07/18 0107    Dorie Rank, MD 03/08/18 [phone]

## 2018-03-06 NOTE — ED Provider Notes (Signed)
Patient placed in Quick Look pathway, seen and evaluated   Chief Complaint: SOB, Left abdomen pain  HPI:   57 y.o. male who presents for evaluation of shortness of breath that began 30 minutes prior to ED arrival.  Patient also reports he is having some left abdominal pain.  He had a paracentesis done today and reports pain at the site where they did it.  No nausea/vomiting.  Patient reports he is a dialysis patient goes Monday, Wednesday, Friday.  He did not go to dialysis today.  Last dialysis was 3 days ago.  Patient denies any chest pain, fevers.  ROS: Shortness of breath, abdominal pain  Physical Exam:   Gen: No distress  Neuro: Awake and Alert  Skin: Warm    Focused Exam: Faint Wheezing in the lower lung fields, palpation noted to the left abdomen with Band-Aid over injection site from paracentesis.   Initiation of care has begun. The patient has been counseled on the process, plan, and necessity for staying for the completion/evaluation, and the remainder of the medical screening examination    Volanda Napoleon, PA-C 03/06/18 1916    Malvin Johns, MD 03/06/18 [phone]

## 2018-03-06 NOTE — Procedures (Signed)
PROCEDURE SUMMARY:  Successful US guided paracentesis from LLQ.  Yielded 4.3 L of clear yellow fluid.  No immediate complications.  Pt tolerated well.   Specimen was sent for labs.  Ascencion Dike PA-C 03/06/2018 9:38 AM

## 2018-03-06 NOTE — ED Triage Notes (Signed)
Pt c/o shortness of breath. Dialysis pt, MWF, did not do dialysis today due to having paracentesis done. Also reports abdominal pain.

## 2018-03-06 NOTE — ED Notes (Signed)
Pt is requesting something for pain and food to eat.  Merleen Nicely EDPA made aware

## 2018-03-06 NOTE — Discharge Instructions (Signed)
Your labs are overall reassuring, your potassium is elevated but you are given medication to help lower this today, you will need to call your dialysis center tomorrow to arrange for dialysis, as you will likely need this sooner than Wednesday, then continue with your usual dialysis schedule.  Please return for worsening shortness of breath, chest pain, lightheadedness or dizziness, abdominal pain, fevers or any other new or concerning symptoms.

## 2018-03-07 ENCOUNTER — Ambulatory Visit (INDEPENDENT_AMBULATORY_CARE_PROVIDER_SITE_OTHER): Payer: Medicare Other | Admitting: Gastroenterology

## 2018-03-07 ENCOUNTER — Encounter: Payer: Self-pay | Admitting: Gastroenterology

## 2018-03-07 VITALS — BP 122/62 | HR 78 | Ht 69.0 in | Wt 154.0 lb

## 2018-03-07 DIAGNOSIS — R188 Other ascites: Secondary | ICD-10-CM | POA: Diagnosis not present

## 2018-03-07 DIAGNOSIS — K7460 Unspecified cirrhosis of liver: Secondary | ICD-10-CM | POA: Diagnosis not present

## 2018-03-07 MED ORDER — LACTULOSE 10 GM/15ML PO SOLN: 30.0000 g | Freq: Two times a day (BID) | ORAL | 3 refills | Status: DC

## 2018-03-07 NOTE — Patient Instructions (Signed)
We have sent the following medications to your pharmacy for you to pick up at your convenience:  Lactulose 30 ml twice a day  We have given you ScandiShakes to drink with milk.

## 2018-03-07 NOTE — Progress Notes (Addendum)
03/07/2018 [name] [id number] [birth year]   HISTORY OF PRESENT ILLNESS:  This is a 57 year old African-American male with decompensated cirrhosis complicated by history of hepatic encephalopathy, ascites, and recurrent SBP.  He is currently on prophylaxis with Bactrim.  He's been having multiple paracentesis recently because when he gets too much fluid in there it prevents him from being able to eat and he cannot afford to lose more weight.  I briefly discussed with him regarding a 2 g sodium diet, but it sounds like compliance is going to be extreme issue for him to be able to follow this.  Sounds like he has been eating mostly canned foods such as ravioli and spam as well as frozen dinners.  He says that he will starve if he can eat that stuff because he does not have anybody to cook for him.  Just had paracentesis yesterday with 4.3 Liters removed.  No SBP on fluid studies.  No esophageal varices seen on EGD in July 2019.  Complaining of constipation.  Has not been taking his lactulose because he ran out.  Is asking if we can restart it.    Past Medical History:  Diagnosis Date  . Anemia   . Anxiety   . Arthritis    left shoulder  . Atherosclerosis of aorta ([place])   . Cardiomegaly   . Chest pain    DATE UNKNOWN, C/O PERIODICALLY  . Cocaine abuse ([place])   . COPD exacerbation ([place]) 08/17/2016  . Coronary artery disease    stent 02/22/17  . ESRD (end stage renal disease) on dialysis ([place])    "[name]; MWF" (07/04/2017)  . GERD (gastroesophageal reflux disease)    DATE UNKNOWN  . Hemorrhoids   . Hepatitis B, chronic ([place])   . Hepatitis C   . History of kidney stones   . Hyperkalemia   . Hypertension   . Kidney failure   . Metabolic bone disease    Patient denies  . Mitral stenosis   . Myocardial infarction ([place])   . Pneumonia   . Pulmonary edema   . Solitary rectal ulcer syndrome 07/2017   at flex sig for rectal bleeding  . Tubular adenoma of colon     Past Surgical History:  Procedure Laterality Date  . A/V FISTULAGRAM Left 05/26/2017   Procedure: A/V FISTULAGRAM;  Surgeon: Conrad Traskwood, MD;  Location: [place] [facility];  Service: Cardiovascular;  Laterality: Left;  . A/V FISTULAGRAM Right 11/18/2017   Procedure: A/V FISTULAGRAM - Right Arm;  Surgeon: Elam Dutch, MD;  Location: [place] [facility];  Service: Cardiovascular;  Laterality: Right;  . APPLICATION OF WOUND VAC Left 06/14/2017   Procedure: APPLICATION OF WOUND VAC;  Surgeon: Katha Cabal, MD;  Location: [facility];  Service: Vascular;  Laterality: Left;  . AV FISTULA PLACEMENT  2012   BELIEVED WAS PLACED IN JUNE  . AV FISTULA PLACEMENT Right 08/09/2017   Procedure: Creation Right arm ARTERIOVENOUS BRACHIOCEPOHALIC FISTULA;  Surgeon: Elam Dutch, MD;  Location: [facility] OR;  Service: Vascular;  Laterality: Right;  . AV FISTULA PLACEMENT Right 11/22/2017   Procedure: INSERTION OF ARTERIOVENOUS (AV) GORE-TEX GRAFT RIGHT UPPER ARM;  Surgeon: Elam Dutch, MD;  Location: [place];  Service: Vascular;  Laterality: Right;  . BIOPSY  01/25/2018   Procedure: BIOPSY;  Surgeon: Jerene Bears, MD;  Location: [facility] ENDOSCOPY;  Service: Gastroenterology;;  . COLONOSCOPY    . COLONOSCOPY WITH PROPOFOL N/A 01/25/2018  Procedure: COLONOSCOPY WITH PROPOFOL;  Surgeon: Jerene Bears, MD;  Location: [place];  Service: Gastroenterology;  Laterality: N/A;  . CORONARY STENT INTERVENTION N/A 02/22/2017   Procedure: CORONARY STENT INTERVENTION;  Surgeon: Nigel Mormon, MD;  Location: [place] [facility];  Service: Cardiovascular;  Laterality: N/A;  . ESOPHAGOGASTRODUODENOSCOPY (EGD) WITH PROPOFOL N/A 01/25/2018   Procedure: ESOPHAGOGASTRODUODENOSCOPY (EGD) WITH PROPOFOL;  Surgeon: Jerene Bears, MD;  Location: [place];  Service: Gastroenterology;  Laterality: N/A;  . FLEXIBLE SIGMOIDOSCOPY N/A 07/15/2017   Procedure: FLEXIBLE SIGMOIDOSCOPY;  Surgeon: Carol Ada, MD;  Location: [place];  Service: Endoscopy;  Laterality: N/A;  . HEMORRHOID BANDING    . I&D EXTREMITY Left 06/01/2017   Procedure: IRRIGATION AND DEBRIDEMENT LEFT ARM HEMATOMA WITH LIGATION OF LEFT ARM AV FISTULA;  Surgeon: Elam Dutch, MD;  Location: [place];  Service: Vascular;  Laterality: Left;  . I&D EXTREMITY Left 06/14/2017   Procedure: IRRIGATION AND DEBRIDEMENT EXTREMITY;  Surgeon: Katha Cabal, MD;  Location: [facility];  Service: Vascular;  Laterality: Left;  . INSERTION OF DIALYSIS CATHETER  05/30/2017  . INSERTION OF DIALYSIS CATHETER N/A 05/30/2017   Procedure: INSERTION OF DIALYSIS CATHETER;  Surgeon: Elam Dutch, MD;  Location: [place];  Service: Vascular;  Laterality: N/A;  . IR PARACENTESIS  08/30/2017  . IR PARACENTESIS  09/29/2017  . IR PARACENTESIS  10/28/2017  . IR PARACENTESIS  11/09/2017  . IR PARACENTESIS  11/16/2017  . IR PARACENTESIS  11/28/2017  . IR PARACENTESIS  12/01/2017  . IR PARACENTESIS  12/06/2017  . IR PARACENTESIS  01/03/2018  . IR PARACENTESIS  01/23/2018  . IR PARACENTESIS  02/07/2018  . IR PARACENTESIS  02/21/2018  . IR PARACENTESIS  03/06/2018  . IR RADIOLOGIST EVAL & MGMT  02/14/2018  . LEFT HEART CATH AND CORONARY ANGIOGRAPHY N/A 02/22/2017   Procedure: LEFT HEART CATH AND CORONARY ANGIOGRAPHY;  Surgeon: Nigel Mormon, MD;  Location: [place] [facility];  Service: Cardiovascular;  Laterality: N/A;  . LIGATION OF ARTERIOVENOUS  FISTULA Left 11/16/996   Procedure: Plication of Left Arm Arteriovenous Fistula;  Surgeon: Elam Dutch, MD;  Location: [place];  Service: Vascular;  Laterality: Left;  . POLYPECTOMY    . POLYPECTOMY  01/25/2018   Procedure: POLYPECTOMY;  Surgeon: Jerene Bears, MD;  Location: [place];  Service: Gastroenterology;;  . REVISON OF ARTERIOVENOUS FISTULA Left 3/38/2505   Procedure: PLICATION OF DISTAL ANEURYSMAL SEGEMENT OF LEFT UPPER ARM ARTERIOVENOUS FISTULA;  Surgeon: Elam Dutch, MD;  Location: [place];  Service: Vascular;   Laterality: Left;  . REVISON OF ARTERIOVENOUS FISTULA Left 3/97/6734   Procedure: Plication of Left Upper Arm Fistula ;  Surgeon: Waynetta Sandy, MD;  Location: [place];  Service: Vascular;  Laterality: Left;  . SKIN GRAFT SPLIT THICKNESS LEG / FOOT Left    SKIN GRAFT SPLIT THICKNESS LEFT ARM DONOR SITE: LEFT ANTERIOR THIGH  . SKIN SPLIT GRAFT Left 07/04/2017   Procedure: SKIN GRAFT SPLIT THICKNESS LEFT ARM DONOR SITE: LEFT ANTERIOR THIGH;  Surgeon: Elam Dutch, MD;  Location: [place];  Service: Vascular;  Laterality: Left;  . THROMBECTOMY W/ EMBOLECTOMY Left 06/05/2017   Procedure: EXPLORATION OF LEFT ARM FOR BLEEDING; OVERSEWED PROXIMAL FISTULA;  Surgeon: Angelia Mould, MD;  Location: [place];  Service: Vascular;  Laterality: Left;  . WOUND EXPLORATION Left 06/03/2017   Procedure: WOUND EXPLORATION WITH WOUND VAC APPLICATION TO LEFT ARM;  Surgeon: Angelia Mould, MD;  Location: [facility];  Service: Vascular;  Laterality: Left;    reports that he has been smoking cigarettes. He started smoking about 44 years ago. He has a 21.50 pack-year smoking history. He has never used smokeless tobacco. He reports that he drank alcohol. He reports that he has current or past drug history. Drug: Marijuana. family history includes COPD in his father and other; Esophageal cancer in his sister; Heart disease in his father and mother; Hypertension in his other; Lung cancer in his mother; Malignant hyperthermia in his father; Throat cancer in his sister. Allergies  Allergen Reactions  . Morphine And Related Other (See Comments)    Stomach pain  . Aspirin Other (See Comments)    STOMACH PAIN  . Clonidine Derivatives Itching  . Tramadol Itching  . Tylenol [Acetaminophen] Nausea Only    Stomach ache      Outpatient Encounter Medications as of 03/07/2018  Medication Sig  . albuterol (VENTOLIN HFA) 108 (90 Base) MCG/ACT inhaler Inhale 2 puffs into the lungs every 6 (six) hours as needed  for wheezing or shortness of breath.  . diltiazem (CARDIZEM CD) 120 MG 24 hr capsule Take 1 capsule (120 mg total) by mouth daily. (Patient taking differently: Take 120 mg by mouth daily as needed (for high blood pressure). )  . gabapentin (NEURONTIN) 100 MG capsule Take 100 mg by mouth 2 (two) times daily.  . hydrOXYzine (VISTARIL) 25 MG capsule Take 25-50 mg by mouth at bedtime as needed for anxiety.  Marland Kitchen oxyCODONE (OXY IR/ROXICODONE) 5 MG immediate release tablet Take 5 mg by mouth 2 (two) times daily as needed for pain.  . sevelamer carbonate (RENVELA) 800 MG tablet Take 4 tablets with meals  And 2 tablets twice daily with snacks (Patient taking differently: Take 1,600-3,200 mg by mouth See admin instructions. Take 4 tablets (3,200 mg) by mouth with meals and 2 tablets (1,600 mg) with snacks)  . sulfamethoxazole-trimethoprim (BACTRIM DS,SEPTRA DS) 800-160 MG tablet Take 1 tablet by mouth 2 (two) times daily.  . traZODone (DESYREL) 150 MG tablet Take 150 mg by mouth at bedtime as needed for sleep.  . hydrALAZINE (APRESOLINE) 100 MG tablet Take 100 mg by mouth 2 (two) times daily as needed. Taking after HD on Dialysis days   . lactulose (CHRONULAC) 10 GM/15ML solution Take 10 g by mouth 2 (two) times daily.   No facility-administered encounter medications on file as of 03/07/2018.      REVIEW OF SYSTEMS  : All other systems reviewed and negative except where noted in the History of Present Illness.   PHYSICAL EXAM: Ht 5\' 9"  (1.753 m)   Wt 154 lb (69.9 kg)   BMI 22.74 kg/m  General: Well developed black male in no acute distress Head: Normocephalic and atraumatic Eyes:  Sclerae anicteric, conjunctiva pink. Ears: Normal auditory acuity Lungs:  Some wheezing noted throughout. Heart: Regular rate and rhythm; no M/R/G. Abdomen: Soft, slightly distended with ascites fluid.  Tender at site of paracentesis. Musculoskeletal: Symmetrical with no gross deformities  Skin: No lesions on visible  extremities Extremities: No edema  Neurological: Alert oriented x 4, grossly non-focal Psychological:  Alert and cooperative. Normal mood and affect  ASSESSMENT AND PLAN: #54 57 year old African-American male with decompensated cirrhosis complicated by history of hepatic encephalopathy, ascites, and recurrent SBP.  He is currently on prophylaxis with Bactrim.  He's been having multiple paracentesis recently because when he gets too much fluid in there it prevents him from being able to eat and  he cannot afford to lose more weight.  Unfortunately our options are limited with him due to his end-stage renal disease and dialysis.  He is asking to see a liver specialist, but I advised him that I did not know what more they would have to offer him.  He is certainly not a transplant candidate since he is also on dialysis.  Diuretics are not an option to help with his fluid and he is not a candidate for TIPS procedure.  I briefly discussed with him regarding a 2 g sodium diet, but it sounds like compliance is going to be extreme issue for him to be able to follow this.  Sounds like he has been eating mostly canned foods such as ravioli and spam as well as frozen dinners.  He says that he will starve if he can eat that stuff because he does not have anybody to cook for him.  ? Continue paracentesis every 10 days?    #2 end-stage renal disease on dialysis M/W/F #3 COPD #4 history of atrial fib/flutter #5 prior history of substance abuse #6 personality disorder #7 history of multiple adenomatous colon polyps #8 history of GI bleeding secondary to rectal ulcers January 2019 #9 constipation:  Will restart lactulose, will give 20 grams BID as he was supposed to be on this for his HE in the past anyway.  *He was given some samples of ScandiShakes to try.  We checked with the dialysis center to be sure that they were safe.   CC:  [name], MD   Addendum: Reviewed and agree with management. This is a  difficult situation given recurrent abdominal ascites in the setting of end-stage renal disease on hemodialysis.  He is not a TIPS candidate.  I presume his ascites which overall is low volume but causes significant abdominal symptoms is related to dietary indiscretion and nonadherence to low-sodium diet. I am okay for him to be referred to [facility] liver clinic for a second opinion which would likely only reassure the patient as to current management. Pyrtle, Lajuan Lines, MD

## 2018-03-08 DIAGNOSIS — D509 Iron deficiency anemia, unspecified: Secondary | ICD-10-CM | POA: Diagnosis not present

## 2018-03-08 DIAGNOSIS — D631 Anemia in chronic kidney disease: Secondary | ICD-10-CM | POA: Diagnosis not present

## 2018-03-08 DIAGNOSIS — N2581 Secondary hyperparathyroidism of renal origin: Secondary | ICD-10-CM | POA: Diagnosis not present

## 2018-03-08 DIAGNOSIS — Z79891 Long term (current) use of opiate analgesic: Secondary | ICD-10-CM | POA: Diagnosis not present

## 2018-03-08 DIAGNOSIS — N186 End stage renal disease: Secondary | ICD-10-CM | POA: Diagnosis not present

## 2018-03-08 DIAGNOSIS — M79602 Pain in left arm: Secondary | ICD-10-CM | POA: Diagnosis not present

## 2018-03-08 DIAGNOSIS — G8929 Other chronic pain: Secondary | ICD-10-CM | POA: Diagnosis not present

## 2018-03-08 LAB — PATHOLOGIST SMEAR REVIEW

## 2018-03-09 DIAGNOSIS — Z452 Encounter for adjustment and management of vascular access device: Secondary | ICD-10-CM | POA: Diagnosis not present

## 2018-03-10 ENCOUNTER — Encounter (HOSPITAL_COMMUNITY): Payer: Self-pay | Admitting: Emergency Medicine

## 2018-03-10 ENCOUNTER — Emergency Department (HOSPITAL_COMMUNITY): Payer: Medicare Other

## 2018-03-10 ENCOUNTER — Observation Stay (HOSPITAL_COMMUNITY)
Admission: EM | Admit: 2018-03-10 | Discharge: 2018-03-11 | Disposition: A | Discharge status: Home / Self Care | Payer: Medicare Other | Attending: Internal Medicine | Admitting: Internal Medicine

## 2018-03-10 ENCOUNTER — Other Ambulatory Visit: Payer: Self-pay

## 2018-03-10 DIAGNOSIS — B181 Chronic viral hepatitis B without delta-agent: Secondary | ICD-10-CM | POA: Diagnosis not present

## 2018-03-10 DIAGNOSIS — N189 Chronic kidney disease, unspecified: Secondary | ICD-10-CM | POA: Diagnosis present

## 2018-03-10 DIAGNOSIS — F419 Anxiety disorder, unspecified: Secondary | ICD-10-CM | POA: Diagnosis not present

## 2018-03-10 DIAGNOSIS — J449 Chronic obstructive pulmonary disease, unspecified: Secondary | ICD-10-CM | POA: Diagnosis present

## 2018-03-10 DIAGNOSIS — I472 Ventricular tachycardia: Secondary | ICD-10-CM | POA: Diagnosis not present

## 2018-03-10 DIAGNOSIS — I2510 Atherosclerotic heart disease of native coronary artery without angina pectoris: Secondary | ICD-10-CM | POA: Diagnosis not present

## 2018-03-10 DIAGNOSIS — Z886 Allergy status to analgesic agent status: Secondary | ICD-10-CM | POA: Insufficient documentation

## 2018-03-10 DIAGNOSIS — I132 Hypertensive heart and chronic kidney disease with heart failure and with stage 5 chronic kidney disease, or end stage renal disease: Secondary | ICD-10-CM | POA: Diagnosis not present

## 2018-03-10 DIAGNOSIS — R109 Unspecified abdominal pain: Secondary | ICD-10-CM | POA: Diagnosis not present

## 2018-03-10 DIAGNOSIS — K7031 Alcoholic cirrhosis of liver with ascites: Secondary | ICD-10-CM | POA: Insufficient documentation

## 2018-03-10 DIAGNOSIS — I4892 Unspecified atrial flutter: Secondary | ICD-10-CM | POA: Insufficient documentation

## 2018-03-10 DIAGNOSIS — R188 Other ascites: Secondary | ICD-10-CM | POA: Diagnosis present

## 2018-03-10 DIAGNOSIS — K219 Gastro-esophageal reflux disease without esophagitis: Secondary | ICD-10-CM | POA: Diagnosis not present

## 2018-03-10 DIAGNOSIS — D509 Iron deficiency anemia, unspecified: Secondary | ICD-10-CM | POA: Diagnosis not present

## 2018-03-10 DIAGNOSIS — I447 Left bundle-branch block, unspecified: Secondary | ICD-10-CM | POA: Insufficient documentation

## 2018-03-10 DIAGNOSIS — I10 Essential (primary) hypertension: Secondary | ICD-10-CM | POA: Diagnosis present

## 2018-03-10 DIAGNOSIS — G8929 Other chronic pain: Secondary | ICD-10-CM | POA: Diagnosis not present

## 2018-03-10 DIAGNOSIS — K7290 Hepatic failure, unspecified without coma: Secondary | ICD-10-CM | POA: Diagnosis present

## 2018-03-10 DIAGNOSIS — F4323 Adjustment disorder with mixed anxiety and depressed mood: Secondary | ICD-10-CM | POA: Diagnosis not present

## 2018-03-10 DIAGNOSIS — B182 Chronic viral hepatitis C: Secondary | ICD-10-CM | POA: Diagnosis not present

## 2018-03-10 DIAGNOSIS — D696 Thrombocytopenia, unspecified: Secondary | ICD-10-CM | POA: Diagnosis not present

## 2018-03-10 DIAGNOSIS — N186 End stage renal disease: Secondary | ICD-10-CM | POA: Diagnosis not present

## 2018-03-10 DIAGNOSIS — M19012 Primary osteoarthritis, left shoulder: Secondary | ICD-10-CM | POA: Insufficient documentation

## 2018-03-10 DIAGNOSIS — D631 Anemia in chronic kidney disease: Secondary | ICD-10-CM | POA: Diagnosis not present

## 2018-03-10 DIAGNOSIS — J42 Unspecified chronic bronchitis: Secondary | ICD-10-CM

## 2018-03-10 DIAGNOSIS — R000 Tachycardia, unspecified: Secondary | ICD-10-CM | POA: Diagnosis not present

## 2018-03-10 DIAGNOSIS — Z992 Dependence on renal dialysis: Secondary | ICD-10-CM | POA: Diagnosis not present

## 2018-03-10 DIAGNOSIS — I480 Paroxysmal atrial fibrillation: Secondary | ICD-10-CM | POA: Diagnosis present

## 2018-03-10 DIAGNOSIS — Z7982 Long term (current) use of aspirin: Secondary | ICD-10-CM | POA: Insufficient documentation

## 2018-03-10 DIAGNOSIS — I272 Pulmonary hypertension, unspecified: Secondary | ICD-10-CM | POA: Diagnosis not present

## 2018-03-10 DIAGNOSIS — F17210 Nicotine dependence, cigarettes, uncomplicated: Secondary | ICD-10-CM | POA: Insufficient documentation

## 2018-03-10 DIAGNOSIS — Z885 Allergy status to narcotic agent status: Secondary | ICD-10-CM | POA: Insufficient documentation

## 2018-03-10 DIAGNOSIS — F1410 Cocaine abuse, uncomplicated: Secondary | ICD-10-CM | POA: Insufficient documentation

## 2018-03-10 DIAGNOSIS — F1010 Alcohol abuse, uncomplicated: Secondary | ICD-10-CM | POA: Diagnosis present

## 2018-03-10 DIAGNOSIS — Z79899 Other long term (current) drug therapy: Secondary | ICD-10-CM | POA: Insufficient documentation

## 2018-03-10 DIAGNOSIS — R1030 Lower abdominal pain, unspecified: Secondary | ICD-10-CM | POA: Insufficient documentation

## 2018-03-10 DIAGNOSIS — Z9114 Patient's other noncompliance with medication regimen: Secondary | ICD-10-CM | POA: Insufficient documentation

## 2018-03-10 DIAGNOSIS — I252 Old myocardial infarction: Secondary | ICD-10-CM | POA: Diagnosis not present

## 2018-03-10 DIAGNOSIS — K7460 Unspecified cirrhosis of liver: Secondary | ICD-10-CM | POA: Diagnosis present

## 2018-03-10 DIAGNOSIS — E875 Hyperkalemia: Secondary | ICD-10-CM | POA: Diagnosis not present

## 2018-03-10 DIAGNOSIS — I4891 Unspecified atrial fibrillation: Secondary | ICD-10-CM | POA: Diagnosis not present

## 2018-03-10 DIAGNOSIS — N2581 Secondary hyperparathyroidism of renal origin: Secondary | ICD-10-CM | POA: Diagnosis not present

## 2018-03-10 DIAGNOSIS — Z8249 Family history of ischemic heart disease and other diseases of the circulatory system: Secondary | ICD-10-CM | POA: Insufficient documentation

## 2018-03-10 DIAGNOSIS — Z955 Presence of coronary angioplasty implant and graft: Secondary | ICD-10-CM | POA: Insufficient documentation

## 2018-03-10 DIAGNOSIS — R0602 Shortness of breath: Secondary | ICD-10-CM | POA: Diagnosis not present

## 2018-03-10 LAB — COMPREHENSIVE METABOLIC PANEL
ALT: 11 U/L (ref 0–44)
AST: 21 U/L (ref 15–41)
Albumin: 3.4 g/dL — ABNORMAL LOW (ref 3.5–5.0)
Alkaline Phosphatase: 102 U/L (ref 38–126)
Anion gap: 11 (ref 5–15)
BUN: 13 mg/dL (ref 6–20)
CO2: 30 mmol/L (ref 22–32)
Calcium: 7.9 mg/dL — ABNORMAL LOW (ref 8.9–10.3)
Chloride: 93 mmol/L — ABNORMAL LOW (ref 98–111)
Creatinine, Ser: 5.03 mg/dL — ABNORMAL HIGH (ref 0.61–1.24)
GFR calc Af Amer: 14 mL/min — ABNORMAL LOW (ref >60)
GFR calc non Af Amer: 12 mL/min — ABNORMAL LOW (ref >60)
Glucose, Bld: 146 mg/dL — ABNORMAL HIGH (ref 70–99)
Potassium: 4.9 mmol/L (ref 3.5–5.1)
Sodium: 134 mmol/L — ABNORMAL LOW (ref 135–145)
Total Bilirubin: 1 mg/dL (ref 0.3–1.2)
Total Protein: 6.3 g/dL — ABNORMAL LOW (ref 6.5–8.1)

## 2018-03-10 LAB — CBC
HCT: 30.3 % — ABNORMAL LOW (ref 39.0–52.0)
Hemoglobin: 10.6 g/dL — ABNORMAL LOW (ref 13.0–17.0)
MCH: 27.3 pg (ref 26.0–34.0)
MCHC: 35 g/dL (ref 30.0–36.0)
MCV: 78.1 fL (ref 78.0–100.0)
Platelets: 108 10*3/uL — ABNORMAL LOW (ref 150–400)
RBC: 3.88 MIL/uL — ABNORMAL LOW (ref 4.22–5.81)
RDW: 14.6 % (ref 11.5–15.5)
WBC: 7.1 10*3/uL (ref 4.0–10.5)

## 2018-03-10 LAB — LIPASE, BLOOD: Lipase: 31 U/L (ref 11–51)

## 2018-03-10 LAB — MAGNESIUM: Magnesium: 2.1 mg/dL (ref 1.7–2.4)

## 2018-03-10 LAB — I-STAT TROPONIN, ED: Troponin i, poc: 0.03 ng/mL (ref 0.00–0.08)

## 2018-03-10 MED ORDER — DM-GUAIFENESIN ER 30-600 MG PO TB12: 1.0000 | ORAL_TABLET | Freq: Two times a day (BID) | ORAL | Status: DC | PRN

## 2018-03-10 MED ORDER — SULFAMETHOXAZOLE-TRIMETHOPRIM 800-160 MG PO TABS
1.0000 | ORAL_TABLET | Freq: Two times a day (BID) | ORAL | Status: DC
  Administered 2018-03-11: 1 via ORAL
  Filled 2018-03-10: qty 1

## 2018-03-10 MED ORDER — ASPIRIN EC 81 MG PO TBEC
81.0000 mg | DELAYED_RELEASE_TABLET | Freq: Every day | ORAL | Status: DC
  Administered 2018-03-11: 81 mg via ORAL
  Filled 2018-03-10: qty 1

## 2018-03-10 MED ORDER — HYDROXYZINE HCL 10 MG PO TABS: 10.0000 mg | ORAL_TABLET | Freq: Three times a day (TID) | ORAL | Status: DC | PRN

## 2018-03-10 MED ORDER — SEVELAMER CARBONATE 800 MG PO TABS: 1600.0000 mg | ORAL_TABLET | Freq: Two times a day (BID) | ORAL | Status: DC | PRN

## 2018-03-10 MED ORDER — HYDROXYZINE HCL 25 MG PO TABS: 25.0000 mg | ORAL_TABLET | Freq: Every evening | ORAL | Status: DC | PRN

## 2018-03-10 MED ORDER — HYDRALAZINE HCL 20 MG/ML IJ SOLN: 5.0000 mg | INTRAMUSCULAR | Status: DC | PRN

## 2018-03-10 MED ORDER — NICOTINE 21 MG/24HR TD PT24
21.0000 mg | MEDICATED_PATCH | Freq: Every day | TRANSDERMAL | Status: DC
  Filled 2018-03-10: qty 1

## 2018-03-10 MED ORDER — METOPROLOL TARTRATE 25 MG PO TABS
25.0000 mg | ORAL_TABLET | Freq: Two times a day (BID) | ORAL | Status: DC
  Administered 2018-03-10 – 2018-03-11 (×2): 25 mg via ORAL
  Filled 2018-03-10 (×2): qty 1

## 2018-03-10 MED ORDER — ZOLPIDEM TARTRATE 5 MG PO TABS: 5.0000 mg | ORAL_TABLET | Freq: Every evening | ORAL | Status: DC | PRN

## 2018-03-10 MED ORDER — SEVELAMER CARBONATE 800 MG PO TABS
3200.0000 mg | ORAL_TABLET | Freq: Three times a day (TID) | ORAL | Status: DC
  Administered 2018-03-11 (×2): 3200 mg via ORAL
  Filled 2018-03-10 (×2): qty 4

## 2018-03-10 MED ORDER — HEPARIN SODIUM (PORCINE) 5000 UNIT/ML IJ SOLN
5000.0000 [IU] | Freq: Three times a day (TID) | INTRAMUSCULAR | Status: DC
  Filled 2018-03-10: qty 1

## 2018-03-10 MED ORDER — GABAPENTIN 100 MG PO CAPS
100.0000 mg | ORAL_CAPSULE | Freq: Two times a day (BID) | ORAL | Status: DC
  Administered 2018-03-11: 100 mg via ORAL
  Filled 2018-03-10: qty 1

## 2018-03-10 MED ORDER — ALBUTEROL SULFATE (2.5 MG/3ML) 0.083% IN NEBU: 2.5000 mg | INHALATION_SOLUTION | RESPIRATORY_TRACT | Status: DC | PRN

## 2018-03-10 MED ORDER — FENTANYL CITRATE (PF) 100 MCG/2ML IJ SOLN
50.0000 ug | Freq: Once | INTRAMUSCULAR | Status: AC
  Administered 2018-03-10: 50 ug via INTRAVENOUS
  Filled 2018-03-10: qty 2

## 2018-03-10 MED ORDER — OXYCODONE HCL 5 MG PO TABS
5.0000 mg | ORAL_TABLET | ORAL | Status: DC | PRN
  Filled 2018-03-10: qty 1

## 2018-03-10 MED ORDER — ALBUTEROL SULFATE HFA 108 (90 BASE) MCG/ACT IN AERS
2.0000 | INHALATION_SPRAY | Freq: Once | RESPIRATORY_TRACT | Status: DC
  Filled 2018-03-10: qty 6.7

## 2018-03-10 MED ORDER — TRAZODONE HCL 50 MG PO TABS: 150.0000 mg | ORAL_TABLET | Freq: Every evening | ORAL | Status: DC | PRN

## 2018-03-10 MED ORDER — PANTOPRAZOLE SODIUM 40 MG PO TBEC
40.0000 mg | DELAYED_RELEASE_TABLET | Freq: Every day | ORAL | Status: DC
  Administered 2018-03-11: 40 mg via ORAL
  Filled 2018-03-10: qty 1

## 2018-03-10 MED ORDER — SEVELAMER CARBONATE 800 MG PO TABS: 1600.0000 mg | ORAL_TABLET | ORAL | Status: DC

## 2018-03-10 MED ORDER — LACTULOSE 10 GM/15ML PO SOLN
30.0000 g | Freq: Two times a day (BID) | ORAL | Status: DC
  Administered 2018-03-11: 30 g via ORAL
  Filled 2018-03-10: qty 45

## 2018-03-10 NOTE — H&P (Addendum)
History and Physical    [name] EPP:[id number] DOB: Feb 24, 1961 DOA: 03/10/2018  Referring MD/NP/PA:   PCP: [name], MD   Patient coming from:  The patient is coming from home.  At baseline, pt is independent for most of ADL. SNF  Assistant living facility   Retirement center.       Chief Complaint: abdominal pain and SOB  HPI: [name] is a 57 y.o. male with medical history significant of alcohol abusein remission,HBV, HCV,cirrhosis with ascites,GIB, SBP,hypertension, COPD, GERD, anxiety, anemia, polysubstance abuse (tobacco, cocaine and marijuana), CAD, ESRD-HD (MWF),A fib/A flutter not on AC,pulmonary hypertension, who presents with abdominal painand SOB.  Pt states that he started having SOB after he finished HD and went home today.  Patient denies cough and CP, but stating that he had tested burning sensation which has resolved currently.  No fever or chills. He states that he has chronic abdominal pain, which is located in the lower abdomen, constant, sharp, 8 out of 10 severity, nonradiating.  He states that this is chronic issue, no change in nature or intensity.  He has nausea, but no vomiting.  He states that he had one loose stool bowel movement earlier.  Currently patient does not have nausea, vomiting, diarrhea.  He denies symptoms of UTI. Per report, EMF found him to be in wide complex tachycardia with HR up to 170s. Reportedly, adenosine IV was used without change in the rhythm. Symptoms has improved after given amiodarone. No unilateral weakness.  ED Course: pt was found to have WBC 7.1, negative troponin, lipase 31, potassium 4.9, bicarbonate of 30, creatinine 5.03, BUN 13, temperature normal, heart rate 80s to 100s, no tachypnea, oxygen saturation 90% on room air.  Patient is placed on telemetry bed for observation.  Cardiology, Dr. Virgina Jock was consulted.  Review of Systems:   General: no fevers, chills, no body weight gain, has  fatigue HEENT: no blurry vision, hearing changes or sore throat Respiratory: has dyspnea, no coughing, wheezing CV: no chest pain, no palpitations GI: has nausea, vomiting, abdominal pain, diarrhea, no constipation GU: no dysuria, burning on urination, increased urinary frequency, hematuria  Ext: no leg edema Neuro: no unilateral weakness, numbness, or tingling, no vision change or hearing loss Skin: no rash, no skin tear. MSK: No muscle spasm, no deformity, no limitation of range of movement in spin Heme: No easy bruising.  Travel history: No recent long distant travel.  Allergy:  Allergies  Allergen Reactions  . Morphine And Related Other (See Comments)    Stomach pain  . Aspirin Other (See Comments)    STOMACH PAIN  . Clonidine Derivatives Itching  . Tramadol Itching  . Tylenol [Acetaminophen] Nausea Only    Stomach ache    Past Medical History:  Diagnosis Date  . Anemia   . Anxiety   . Arthritis    left shoulder  . Atherosclerosis of aorta ([place])   . Cardiomegaly   . Chest pain    DATE UNKNOWN, C/O PERIODICALLY  . Cocaine abuse ([place])   . COPD exacerbation ([place]) 08/17/2016  . Coronary artery disease    stent 02/22/17  . ESRD (end stage renal disease) on dialysis ([place])    "[name]; MWF" (07/04/2017)  . GERD (gastroesophageal reflux disease)    DATE UNKNOWN  . Hemorrhoids   . Hepatitis B, chronic ([place])   . Hepatitis C   . History of kidney stones   . Hyperkalemia   . Hypertension   . Kidney  failure   . Metabolic bone disease    Patient denies  . Mitral stenosis   . Myocardial infarction ([place])   . Pneumonia   . Pulmonary edema   . Solitary rectal ulcer syndrome 07/2017   at flex sig for rectal bleeding  . Tubular adenoma of colon     Past Surgical History:  Procedure Laterality Date  . A/V FISTULAGRAM Left 05/26/2017   Procedure: A/V FISTULAGRAM;  Surgeon: Conrad Hillburn, MD;  Location: [place] [facility];  Service: Cardiovascular;  Laterality: Left;    . A/V FISTULAGRAM Right 11/18/2017   Procedure: A/V FISTULAGRAM - Right Arm;  Surgeon: Elam Dutch, MD;  Location: [place] [facility];  Service: Cardiovascular;  Laterality: Right;  . APPLICATION OF WOUND VAC Left 06/14/2017   Procedure: APPLICATION OF WOUND VAC;  Surgeon: Katha Cabal, MD;  Location: [facility];  Service: Vascular;  Laterality: Left;  . AV FISTULA PLACEMENT  2012   BELIEVED WAS PLACED IN JUNE  . AV FISTULA PLACEMENT Right 08/09/2017   Procedure: Creation Right arm ARTERIOVENOUS BRACHIOCEPOHALIC FISTULA;  Surgeon: Elam Dutch, MD;  Location: [facility] OR;  Service: Vascular;  Laterality: Right;  . AV FISTULA PLACEMENT Right 11/22/2017   Procedure: INSERTION OF ARTERIOVENOUS (AV) GORE-TEX GRAFT RIGHT UPPER ARM;  Surgeon: Elam Dutch, MD;  Location: [place];  Service: Vascular;  Laterality: Right;  . BIOPSY  01/25/2018   Procedure: BIOPSY;  Surgeon: Jerene Bears, MD;  Location: [facility]COPY;  Service: Gastroenterology;;  . COLONOSCOPY    . COLONOSCOPY WITH PROPOFOL N/A 01/25/2018   Procedure: COLONOSCOPY WITH PROPOFOL;  Surgeon: Jerene Bears, MD;  Location: [place];  Service: Gastroenterology;  Laterality: N/A;  . CORONARY STENT INTERVENTION N/A 02/22/2017   Procedure: CORONARY STENT INTERVENTION;  Surgeon: Nigel Mormon, MD;  Location: [place] [facility];  Service: Cardiovascular;  Laterality: N/A;  . ESOPHAGOGASTRODUODENOSCOPY (EGD) WITH PROPOFOL N/A 01/25/2018   Procedure: ESOPHAGOGASTRODUODENOSCOPY (EGD) WITH PROPOFOL;  Surgeon: Jerene Bears, MD;  Location: [place];  Service: Gastroenterology;  Laterality: N/A;  . FLEXIBLE SIGMOIDOSCOPY N/A 07/15/2017   Procedure: FLEXIBLE SIGMOIDOSCOPY;  Surgeon: Carol Ada, MD;  Location: [place];  Service: Endoscopy;  Laterality: N/A;  . HEMORRHOID BANDING    . I&D EXTREMITY Left 06/01/2017   Procedure: IRRIGATION AND DEBRIDEMENT LEFT ARM HEMATOMA WITH LIGATION OF LEFT ARM AV FISTULA;  Surgeon: Elam Dutch, MD;  Location: [place];  Service: Vascular;  Laterality: Left;  . I&D EXTREMITY Left 06/14/2017   Procedure: IRRIGATION AND DEBRIDEMENT EXTREMITY;  Surgeon: Katha Cabal, MD;  Location: [facility];  Service: Vascular;  Laterality: Left;  . INSERTION OF DIALYSIS CATHETER  05/30/2017  . INSERTION OF DIALYSIS CATHETER N/A 05/30/2017   Procedure: INSERTION OF DIALYSIS CATHETER;  Surgeon: Elam Dutch, MD;  Location: [place];  Service: Vascular;  Laterality: N/A;  . IR PARACENTESIS  08/30/2017  . IR PARACENTESIS  09/29/2017  . IR PARACENTESIS  10/28/2017  . IR PARACENTESIS  11/09/2017  . IR PARACENTESIS  11/16/2017  . IR PARACENTESIS  11/28/2017  . IR PARACENTESIS  12/01/2017  . IR PARACENTESIS  12/06/2017  . IR PARACENTESIS  01/03/2018  . IR PARACENTESIS  01/23/2018  . IR PARACENTESIS  02/07/2018  . IR PARACENTESIS  02/21/2018  . IR PARACENTESIS  03/06/2018  . IR RADIOLOGIST EVAL & MGMT  02/14/2018  . LEFT HEART CATH AND CORONARY ANGIOGRAPHY N/A 02/22/2017   Procedure: LEFT HEART CATH AND  CORONARY ANGIOGRAPHY;  Surgeon: Nigel Mormon, MD;  Location: [place] [facility];  Service: Cardiovascular;  Laterality: N/A;  . LIGATION OF ARTERIOVENOUS  FISTULA Left 1/0/1751   Procedure: Plication of Left Arm Arteriovenous Fistula;  Surgeon: Elam Dutch, MD;  Location: [place];  Service: Vascular;  Laterality: Left;  . POLYPECTOMY    . POLYPECTOMY  01/25/2018   Procedure: POLYPECTOMY;  Surgeon: Jerene Bears, MD;  Location: [place];  Service: Gastroenterology;;  . REVISON OF ARTERIOVENOUS FISTULA Left 0/25/8527   Procedure: PLICATION OF DISTAL ANEURYSMAL SEGEMENT OF LEFT UPPER ARM ARTERIOVENOUS FISTULA;  Surgeon: Elam Dutch, MD;  Location: [place];  Service: Vascular;  Laterality: Left;  . REVISON OF ARTERIOVENOUS FISTULA Left 7/82/4235   Procedure: Plication of Left Upper Arm Fistula ;  Surgeon: Waynetta Sandy, MD;  Location: [place];  Service: Vascular;  Laterality: Left;  .  SKIN GRAFT SPLIT THICKNESS LEG / FOOT Left    SKIN GRAFT SPLIT THICKNESS LEFT ARM DONOR SITE: LEFT ANTERIOR THIGH  . SKIN SPLIT GRAFT Left 07/04/2017   Procedure: SKIN GRAFT SPLIT THICKNESS LEFT ARM DONOR SITE: LEFT ANTERIOR THIGH;  Surgeon: Elam Dutch, MD;  Location: [place];  Service: Vascular;  Laterality: Left;  . THROMBECTOMY W/ EMBOLECTOMY Left 06/05/2017   Procedure: EXPLORATION OF LEFT ARM FOR BLEEDING; OVERSEWED PROXIMAL FISTULA;  Surgeon: Angelia Mould, MD;  Location: [place];  Service: Vascular;  Laterality: Left;  . WOUND EXPLORATION Left 06/03/2017   Procedure: WOUND EXPLORATION WITH WOUND VAC APPLICATION TO LEFT ARM;  Surgeon: Angelia Mould, MD;  Location: [place];  Service: Vascular;  Laterality: Left;    Social History:  reports that he has been smoking cigarettes. He started smoking about 44 years ago. He has a 21.50 pack-year smoking history. He has never used smokeless tobacco. He reports that he drank alcohol. He reports that he has current or past drug history. Drug: Marijuana.  Family History:  Family History  Problem Relation Age of Onset  . Heart disease Mother   . Lung cancer Mother   . Heart disease Father   . Malignant hyperthermia Father   . COPD Father   . Throat cancer Sister   . Esophageal cancer Sister   . Hypertension Other   . COPD Other   . Colon cancer Neg Hx   . Colon polyps Neg Hx   . Rectal cancer Neg Hx   . Stomach cancer Neg Hx      Prior to Admission medications   Medication Sig Start Date End Date Taking? Authorizing Provider  albuterol (VENTOLIN HFA) 108 (90 Base) MCG/ACT inhaler Inhale 2 puffs into the lungs every 6 (six) hours as needed for wheezing or shortness of breath.   Yes [provider]  diltiazem (CARDIZEM CD) 120 MG 24 hr capsule Take 1 capsule (120 mg total) by mouth daily. Patient taking differently: Take 120 mg by mouth daily as needed (for high blood pressure).  06/23/17 06/23/18 Yes Shahmehdi,  Seyed A, MD  gabapentin (NEURONTIN) 100 MG capsule Take 100 mg by mouth 2 (two) times daily. 12/13/17  Yes [provider]  hydrALAZINE (APRESOLINE) 100 MG tablet Take 100 mg by mouth 2 (two) times daily as needed. Taking after HD on Dialysis days    Yes [provider]  hydrOXYzine (VISTARIL) 25 MG capsule Take 25-50 mg by mouth at bedtime as needed for anxiety.   Yes [provider]  lactulose (CHRONULAC) 10 GM/15ML solution  Take 45 mLs (30 g total) by mouth 2 (two) times daily. 03/07/18  Yes Zehr, Laban Emperor, PA-C  oxyCODONE (OXY IR/ROXICODONE) 5 MG immediate release tablet Take 5 mg by mouth 2 (two) times daily as needed for pain. 12/13/17  Yes [provider]  sevelamer carbonate (RENVELA) 800 MG tablet Take 4 tablets with meals  And 2 tablets twice daily with snacks Patient taking differently: Take 1,600-3,200 mg by mouth See admin instructions. Take 4 tablets (3,200 mg) by mouth with meals and 2 tablets (1,600 mg) with snacks 01/22/18  Yes Patrecia Pour, MD  sulfamethoxazole-trimethoprim (BACTRIM DS,SEPTRA DS) 800-160 MG tablet Take 1 tablet by mouth 2 (two) times daily. 02/09/18  Yes Pyrtle, Lajuan Lines, MD  traZODone (DESYREL) 150 MG tablet Take 150 mg by mouth at bedtime as needed for sleep.   Yes [provider]    Physical Exam: Vitals:   03/10/18 2230 03/10/18 2245 03/10/18 2340 03/11/18 0100  BP: 115/83 120/77 127/74   Pulse: 92 87 86   Resp: 16 12 17    Temp:      TempSrc:      SpO2: 92% 96% 92% 93%  Weight:      Height:       General: Not in acute distress HEENT:       Eyes: PERRL, EOMI, no scleral icterus.       ENT: No discharge from the ears and nose, no pharynx injection, no tonsillar enlargement.        Neck: No JVD, no bruit, no mass felt. Heme: No neck lymph node enlargement. Cardiac: S1/S2, RRR, No murmurs, No gallops or rubs. Respiratory: No rales, wheezing, rhonchi or rubs. GI: Soft, nondistended, has tenderness in lower abdomen,  no organomegaly, BS present. GU: No hematuria Ext: No pitting leg edema bilaterally. 2+DP/PT pulse bilaterally. Musculoskeletal: No joint deformities, No joint redness or warmth, no limitation of ROM in spin. Skin: No rashes.  Neuro: Alert, oriented X3, cranial nerves II-XII grossly intact, moves all extremities normally.  Psych: Patient is not psychotic, no suicidal or hemocidal ideation.  Labs on Admission: I have personally reviewed following labs and imaging studies  CBC: Recent Labs  Lab 03/06/18 1922 03/10/18 1929 03/11/18 0313  WBC 6.8 7.1 6.6  NEUTROABS 4.6  --   --   HGB 11.2* 10.6* 10.8*  HCT 32.2* 30.3* 31.9*  MCV 77.8* 78.1 78.2  PLT 113* 108* 902*   Basic Metabolic Panel: Recent Labs  Lab 03/06/18 1922 03/10/18 1929 03/11/18 0313  NA 131* 134* 134*  K 5.9* 4.9 5.6*  CL 91* 93* 92*  CO2 25 30 32  GLUCOSE 83 146* 89  BUN 33* 13 15  CREATININE 8.97* 5.03* 5.58*  CALCIUM 8.2* 7.9* 8.0*  MG  --  2.1  --    GFR: Estimated Creatinine Clearance: 14.8 mL/min (A) (by C-G formula based on SCr of 5.58 mg/dL (H)). Liver Function Tests: Recent Labs  Lab 03/06/18 1922 03/10/18 1929  AST 19 21  ALT 10 11  ALKPHOS 110 102  BILITOT 0.8 1.0  PROT 6.4* 6.3*  ALBUMIN 3.6 3.4*   Recent Labs  Lab 03/10/18 1929  LIPASE 31   No results for input(s): AMMONIA in the last 168 hours. Coagulation Profile: Recent Labs  Lab 03/11/18 0313  INR 1.26   Cardiac Enzymes: No results for input(s): CKTOTAL, CKMB, CKMBINDEX, TROPONINI in the last 168 hours. BNP (last 3 results) No results for input(s): PROBNP in the last 8760 hours. HbA1C:  No results for input(s): HGBA1C in the last 72 hours. CBG: No results for input(s): GLUCAP in the last 168 hours. Lipid Profile: No results for input(s): CHOL, HDL, LDLCALC, TRIG, CHOLHDL, LDLDIRECT in the last 72 hours. Thyroid Function Tests: No results for input(s): TSH, T4TOTAL, FREET4, T3FREE, THYROIDAB in the last 72  hours. Anemia Panel: No results for input(s): VITAMINB12, FOLATE, FERRITIN, TIBC, IRON, RETICCTPCT in the last 72 hours. Urine analysis:    Component Value Date/Time   COLORURINE YELLOW 07/16/2011 1619   APPEARANCEUR CLEAR 07/16/2011 1619   LABSPEC 1.018 07/16/2011 1619   PHURINE 7.5 07/16/2011 1619   GLUCOSEU 250 (A) 07/16/2011 1619   HGBUR MODERATE (A) 07/16/2011 1619   BILIRUBINUR SMALL (A) 07/16/2011 1619   KETONESUR NEGATIVE 07/16/2011 1619   PROTEINUR >300 (A) 07/16/2011 1619   UROBILINOGEN 1.0 07/16/2011 1619   NITRITE NEGATIVE 07/16/2011 1619   LEUKOCYTESUR SMALL (A) 07/16/2011 1619   Sepsis Labs: @LABRCNTIP (procalcitonin:4,lacticidven:4) ) Recent Results (from the past 240 hour(s))  Culture, body fluid-bottle     Status: None (Preliminary result)   Collection Time: 03/06/18  9:14 AM  Result Value Ref Range Status   Specimen Description FLUID PERITONEAL  Final   Special Requests NONE  Final   Culture   Final    NO GROWTH 4 DAYS Performed at [facility] [address], [place], [place] [address]    Report Status PENDING  Incomplete  Gram stain     Status: None   Collection Time: 03/06/18  9:14 AM  Result Value Ref Range Status   Specimen Description FLUID PERITONEAL  Final   Special Requests NONE  Final   Gram Stain   Final    SMALL AMOUNT DEGENERATED CELLULAR MATERIAL PRESENT NO ORGANISMS SEEN Performed at [facility], [address], [place], [place] [address]    Report Status 03/06/2018 FINAL  Final     Radiological Exams on Admission: Dg Chest 2 View  Result Date: 03/10/2018 CLINICAL DATA:  Shortness of breath. EXAM: CHEST - 2 VIEW COMPARISON:  Chest x-ray dated March 06, 2018. FINDINGS: Interval removal of the right internal jugular dialysis catheter. Stable cardiomegaly. Mild pulmonary vascular congestion with resolved interstitial edema. No focal consolidation, pleural effusion, or pneumothorax. No acute osseous abnormality. IMPRESSION:  Mild pulmonary vascular congestion with resolved interstitial edema. Electronically Signed   By: Titus Dubin M.D.   On: 03/10/2018 20:06     EKG: Independently reviewed.  Sinus rhythm, QTC 570, left bundle blockade, widening QRS, poor R wave progression   Assessment/Plan Principal Problem:   Wide-complex tachycardia (HCC) Active Problems:   Alcohol abuse   Thrombocytopenia (HCC)   Essential hypertension   COPD (chronic obstructive pulmonary disease) (HCC)   Hypertension   Anemia due to chronic kidney disease   ESRD on dialysis [facility])   Abdominal pain   Liver cirrhosis (HCC)   Atrial fibrillation (HCC)   CAD (coronary artery disease)   Wide-complex tachycardia ([place]): Pt had wide complex regular tachycardia with HR up to 170 bpm, which has resolved with amiodarone treatment. Card, Dr. Virgina Jock was consuted. He did not think pt had V-tach. He recommend switching diltiazem 120 mg to metoprolol 25 mg PO bid. Per Dr. Virgina Jock, "acute episodes of wide complex tachycardia could be treated with IV amiodarone. Not a candidate for long term amiodarone therapy. Pt has no CP.    -will place on tele bed for obs -metoprolol 25 mg bid -check TSH  Atrialflutter: CHA2DS2-VASc Scoreis 2,  needs oral anticoagulation, but pt is not good candidate for [facility] due high risk of bleeding 2/2 livercirrhosis and thrombocytopenia.HR 100s now -tele monitoring -on metoprolol as above  Hx CAD: no CP -start ASA 81 mg-->start protonix 40 mg daily (due to  Hx of GIB)  Tobacco abuse  -Nicotine patch  HTN:  -Continue home medications: hydralazine and metoprolol -IV hydralazine prn  COPD (chronic obstructive pulmonary disease) ([place]): no wheezing or rhonchi on auscultation. - prn albuterol nebs  Anemia due to chronic kidney disease: hgb stable.Hemoglobin 10.6 -f/u by CBC  ESRD (end stage renal disease) on dialysis (MWF):had HD on Friday. potassium 4.9, bicarbonate of 30, creatinine 5.03,  BUN 13. -please call renal for HD if pt is still her on Monday  Liver cirrhosis (HCC):Mental status normal -continue home lactulose 30 g twice daily -check ammonia level  Abdominal pain: pt states that his abdominal pain is chronic issue, no change in nature today.  Liver function normal.  Lipase normal. pt is on Bactrim chronically. Low suspicions for SBP. -Continue PRN oxycodone -prn hydroxyzine for nausea vomiting  Alcohol abuse: -in remission  Thrombocytopenia ([place]): Platelet 108.  This is due to liver cirrhosis.  No bleeding tendency. -Follow-up with CBC  DVT ppx: SQ Heparin-->SCD (pt refused sq heparin)    Code Status: Full code Family Communication: None at bed side.    Disposition Plan:  Anticipate discharge back to previous home environment Consults called: Card, Dr. Virgina Jock Admission status: Obs / tele      Date of Service 03/11/2018    Ivor Costa [facility] Pager [phone]  If 7PM-7AM, please contact night-coverage [email or web address] Password [facility] 03/11/2018, 4:29 AM

## 2018-03-10 NOTE — ED Triage Notes (Signed)
Pt to ED with c/o mid abd pain.  EMS reports when they put pt on the cardiac monitor pt was in SVT.  Pt was given Adenosine 6mg ,  Adenosine 12mg ,  Adenosine 12mg  and Amiodarone 150mg  and pt converted.  Pt only c/o mid abd pain at this time.  Bowel sounds present through out

## 2018-03-10 NOTE — ED Provider Notes (Signed)
[facility] EMERGENCY DEPARTMENT Provider Note   CSN: [id number] Arrival date & time: 03/10/18  1729     History   Chief Complaint Chief Complaint  Patient presents with  . Tachycardia  . Abdominal Pain    HPI [name] is a 57 y.o. male.  The history is provided by the patient and the EMS personnel.  Shortness of Breath  This is a new problem. Duration: unable to specify. Episode frequency: once.The current episode started 1 to 2 hours ago. The problem has been resolved. Associated symptoms include chest pain (burning epigastric pain) and abdominal pain. Pertinent negatives include no fever, no sore throat, no ear pain, no cough, no vomiting and no rash. Precipitated by: dialysis today. Treatments tried: Patient found to have HR 190, was given adenosine x2 with no improvement. Symptoms improved after amiodarone. Associated medical issues include COPD and CAD.    Past Medical History:  Diagnosis Date  . Anemia   . Anxiety   . Arthritis    left shoulder  . Atherosclerosis of aorta ([place])   . Cardiomegaly   . Chest pain    DATE UNKNOWN, C/O PERIODICALLY  . Cocaine abuse ([place])   . COPD exacerbation ([place]) 08/17/2016  . Coronary artery disease    stent 02/22/17  . ESRD (end stage renal disease) on dialysis ([place])    "[name]; MWF" (07/04/2017)  . GERD (gastroesophageal reflux disease)    DATE UNKNOWN  . Hemorrhoids   . Hepatitis B, chronic ([place])   . Hepatitis C   . History of kidney stones   . Hyperkalemia   . Hypertension   . Kidney failure   . Metabolic bone disease    Patient denies  . Mitral stenosis   . Myocardial infarction ([place])   . Pneumonia   . Pulmonary edema   . Solitary rectal ulcer syndrome 07/2017   at flex sig for rectal bleeding  . Tubular adenoma of colon     Patient Active Problem List   Diagnosis Date Noted  . CAD (coronary artery disease) 03/11/2018  . Benign neoplasm of cecum   . Benign neoplasm of  ascending colon   . Benign neoplasm of descending colon   . Benign neoplasm of rectum   . Atrial fibrillation ([place]) 01/23/2018  . Hx of colonic polyps 01/20/2018  . ESRD (end stage renal disease) ([place]) 11/21/2017  . GERD (gastroesophageal reflux disease) 11/16/2017  . Liver cirrhosis ([place]) 11/15/2017  . DNR (do not resuscitate)   . Palliative care by specialist   . Hyponatremia 11/04/2017  . SBP (spontaneous bacterial peritonitis) ([place]) 10/30/2017  . Liver disease, chronic 10/30/2017  . SOB (shortness of breath)   . Abdominal pain 10/28/2017  . Upper airway cough syndrome with flattening on f/v loop 10/13/17 c/w vcd 10/17/2017  . Elevated diaphragm 10/13/2017  . Ileus ([place]) 09/29/2017  . Malnutrition of moderate degree 09/29/2017  . Sinus congestion 09/03/2017  . Symptomatic anemia 09/02/2017  . Other cirrhosis of liver ([place]) 09/02/2017  . Left bundle branch block 09/02/2017  . Mitral stenosis 09/02/2017  . Hematochezia 07/15/2017  . Wide-complex tachycardia ([place])   . Endotracheally intubated   . ESRD on dialysis ([place]) 07/04/2017  . Acute respiratory failure with hypoxia ([place]) 06/18/2017  . CKD (chronic kidney disease) stage V requiring chronic dialysis ([place]) 06/18/2017  . History of Cocaine abuse ([place]) 06/18/2017  . Hypertension 06/18/2017  . Infection of AV graft for dialysis ([place]) 06/18/2017  . Anxiety 06/18/2017  .  Anemia due to chronic kidney disease 06/18/2017  . Atrial flutter with rapid ventricular response ([place]) 06/18/2017  . Personality disorder ([place]) 06/13/2017  . Cellulitis 06/12/2017  . Adjustment disorder with mixed anxiety and depressed mood 06/10/2017  . Suicidal ideation 06/10/2017  . Arm wound, left, sequela 06/10/2017  . Dyspnea on exertion 05/29/2017  . Tachycardia 05/29/2017  . Hyperkalemia 05/22/2017  . Acute metabolic encephalopathy   . Anemia 04/23/2017  . Ascites 04/23/2017  . COPD (chronic obstructive pulmonary disease) ([place]) 04/23/2017  . Acute  on chronic respiratory failure with hypoxia ([place]) 03/25/2017  . Arrhythmia 03/25/2017  . COPD GOLD 0 with flattening on inps f/v  09/27/2016  . Essential hypertension 09/27/2016  . Fluid overload 08/30/2016  . COPD exacerbation ([place]) 08/17/2016  . Hypertensive urgency 08/17/2016  . Respiratory failure ([place]) 08/17/2016  . Problem with dialysis access ([place]) 07/23/2016  . Chronic hepatitis B ([place]) 03/05/2014  . Chronic hepatitis C without hepatic coma ([place]) 03/05/2014  . Internal hemorrhoids with bleeding, swelling and itching 03/05/2014  . Thrombocytopenia ([place]) 03/05/2014  . Chest pain 02/27/2014  . Alcohol abuse 04/14/2009  . Cigarette smoker 04/14/2009  . GANGLION CYST 04/14/2009    Past Surgical History:  Procedure Laterality Date  . A/V FISTULAGRAM Left 05/26/2017   Procedure: A/V FISTULAGRAM;  Surgeon: Conrad [place], MD;  Location: [facility] CV LAB;  Service: Cardiovascular;  Laterality: Left;  . A/V FISTULAGRAM Right 11/18/2017   Procedure: A/V FISTULAGRAM - Right Arm;  Surgeon: Elam Dutch, MD;  Location: [facility];  Service: Cardiovascular;  Laterality: Right;  . APPLICATION OF WOUND VAC Left 06/14/2017   Procedure: APPLICATION OF WOUND VAC;  Surgeon: Katha Cabal, MD;  Location: [facility] ORS;  Service: Vascular;  Laterality: Left;  . AV FISTULA PLACEMENT  2012   BELIEVED WAS PLACED IN JUNE  . AV FISTULA PLACEMENT Right 08/09/2017   Procedure: Creation Right arm ARTERIOVENOUS BRACHIOCEPOHALIC FISTULA;  Surgeon: Elam Dutch, MD;  Location: [facility] OR;  Service: Vascular;  Laterality: Right;  . AV FISTULA PLACEMENT Right 11/22/2017   Procedure: INSERTION OF ARTERIOVENOUS (AV) GORE-TEX GRAFT RIGHT UPPER ARM;  Surgeon: Elam Dutch, MD;  Location: [facility];  Service: Vascular;  Laterality: Right;  . BIOPSY  01/25/2018   Procedure: BIOPSY;  Surgeon: Jerene Bears, MD;  Location: [facility] ENDOSCOPY;  Service: Gastroenterology;;  . COLONOSCOPY    . COLONOSCOPY WITH  PROPOFOL N/A 01/25/2018   Procedure: COLONOSCOPY WITH PROPOFOL;  Surgeon: Jerene Bears, MD;  Location: [facility];  Service: Gastroenterology;  Laterality: N/A;  . CORONARY STENT INTERVENTION N/A 02/22/2017   Procedure: CORONARY STENT INTERVENTION;  Surgeon: Nigel Mormon, MD;  Location: [facility] CV LAB;  Service: Cardiovascular;  Laterality: N/A;  . ESOPHAGOGASTRODUODENOSCOPY (EGD) WITH PROPOFOL N/A 01/25/2018   Procedure: ESOPHAGOGASTRODUODENOSCOPY (EGD) WITH PROPOFOL;  Surgeon: Jerene Bears, MD;  Location: [facility];  Service: Gastroenterology;  Laterality: N/A;  . FLEXIBLE SIGMOIDOSCOPY N/A 07/15/2017   Procedure: FLEXIBLE SIGMOIDOSCOPY;  Surgeon: Carol Ada, MD;  Location: [facility];  Service: Endoscopy;  Laterality: N/A;  . HEMORRHOID BANDING    . I&D EXTREMITY Left 06/01/2017   Procedure: IRRIGATION AND DEBRIDEMENT LEFT ARM HEMATOMA WITH LIGATION OF LEFT ARM AV FISTULA;  Surgeon: Elam Dutch, MD;  Location: [facility];  Service: Vascular;  Laterality: Left;  . I&D EXTREMITY Left 06/14/2017   Procedure: IRRIGATION AND DEBRIDEMENT EXTREMITY;  Surgeon: Katha Cabal, MD;  Location: [facility] ORS;  Service: Vascular;  Laterality: Left;  . INSERTION OF DIALYSIS CATHETER  05/30/2017  . INSERTION OF DIALYSIS CATHETER N/A 05/30/2017   Procedure: INSERTION OF DIALYSIS CATHETER;  Surgeon: Elam Dutch, MD;  Location: [facility];  Service: Vascular;  Laterality: N/A;  . IR PARACENTESIS  08/30/2017  . IR PARACENTESIS  09/29/2017  . IR PARACENTESIS  10/28/2017  . IR PARACENTESIS  11/09/2017  . IR PARACENTESIS  11/16/2017  . IR PARACENTESIS  11/28/2017  . IR PARACENTESIS  12/01/2017  . IR PARACENTESIS  12/06/2017  . IR PARACENTESIS  01/03/2018  . IR PARACENTESIS  01/23/2018  . IR PARACENTESIS  02/07/2018  . IR PARACENTESIS  02/21/2018  . IR PARACENTESIS  03/06/2018  . IR RADIOLOGIST EVAL & MGMT  02/14/2018  . LEFT HEART CATH AND CORONARY ANGIOGRAPHY N/A 02/22/2017   Procedure: LEFT HEART CATH  AND CORONARY ANGIOGRAPHY;  Surgeon: Nigel Mormon, MD;  Location: [facility] CV LAB;  Service: Cardiovascular;  Laterality: N/A;  . LIGATION OF ARTERIOVENOUS  FISTULA Left 07/15/4313   Procedure: Plication of Left Arm Arteriovenous Fistula;  Surgeon: Elam Dutch, MD;  Location: [facility];  Service: Vascular;  Laterality: Left;  . POLYPECTOMY    . POLYPECTOMY  01/25/2018   Procedure: POLYPECTOMY;  Surgeon: Jerene Bears, MD;  Location: [facility];  Service: Gastroenterology;;  . REVISON OF ARTERIOVENOUS FISTULA Left 4/00/8676   Procedure: PLICATION OF DISTAL ANEURYSMAL SEGEMENT OF LEFT UPPER ARM ARTERIOVENOUS FISTULA;  Surgeon: Elam Dutch, MD;  Location: [facility];  Service: Vascular;  Laterality: Left;  . REVISON OF ARTERIOVENOUS FISTULA Left 1/95/0932   Procedure: Plication of Left Upper Arm Fistula ;  Surgeon: Waynetta Sandy, MD;  Location: [facility];  Service: Vascular;  Laterality: Left;  . SKIN GRAFT SPLIT THICKNESS LEG / FOOT Left    SKIN GRAFT SPLIT THICKNESS LEFT ARM DONOR SITE: LEFT ANTERIOR THIGH  . SKIN SPLIT GRAFT Left 07/04/2017   Procedure: SKIN GRAFT SPLIT THICKNESS LEFT ARM DONOR SITE: LEFT ANTERIOR THIGH;  Surgeon: Elam Dutch, MD;  Location: [facility];  Service: Vascular;  Laterality: Left;  . THROMBECTOMY W/ EMBOLECTOMY Left 06/05/2017   Procedure: EXPLORATION OF LEFT ARM FOR BLEEDING; OVERSEWED PROXIMAL FISTULA;  Surgeon: Angelia Mould, MD;  Location: [facility];  Service: Vascular;  Laterality: Left;  . WOUND EXPLORATION Left 06/03/2017   Procedure: WOUND EXPLORATION WITH WOUND VAC APPLICATION TO LEFT ARM;  Surgeon: Angelia Mould, MD;  Location: [facility];  Service: Vascular;  Laterality: Left;        Home Medications    Prior to Admission medications   Medication Sig Start Date End Date Taking? Authorizing Provider  albuterol (VENTOLIN HFA) 108 (90 Base) MCG/ACT inhaler Inhale 2 puffs into the lungs every 6 (six) hours as needed for wheezing  or shortness of breath.   Yes [provider]  diltiazem (CARDIZEM CD) 120 MG 24 hr capsule Take 1 capsule (120 mg total) by mouth daily. Patient taking differently: Take 120 mg by mouth daily as needed (for high blood pressure).  06/23/17 06/23/18 Yes Shahmehdi, Seyed A, MD  gabapentin (NEURONTIN) 100 MG capsule Take 100 mg by mouth 2 (two) times daily. 12/13/17  Yes [provider]  hydrALAZINE (APRESOLINE) 100 MG tablet Take 100 mg by mouth 2 (two) times daily as needed. Taking after HD on Dialysis days    Yes [provider]  hydrOXYzine (VISTARIL) 25 MG capsule Take 25-50 mg by mouth at bedtime as needed for anxiety.  Yes [provider]  lactulose (CHRONULAC) 10 GM/15ML solution Take 45 mLs (30 g total) by mouth 2 (two) times daily. 03/07/18  Yes Zehr, Laban Emperor, PA-C  oxyCODONE (OXY IR/ROXICODONE) 5 MG immediate release tablet Take 5 mg by mouth 2 (two) times daily as needed for pain. 12/13/17  Yes [provider]  sevelamer carbonate (RENVELA) 800 MG tablet Take 4 tablets with meals  And 2 tablets twice daily with snacks Patient taking differently: Take 1,600-3,200 mg by mouth See admin instructions. Take 4 tablets (3,200 mg) by mouth with meals and 2 tablets (1,600 mg) with snacks 01/22/18  Yes Patrecia Pour, MD  sulfamethoxazole-trimethoprim (BACTRIM DS,SEPTRA DS) 800-160 MG tablet Take 1 tablet by mouth 2 (two) times daily. 02/09/18  Yes Pyrtle, Lajuan Lines, MD  traZODone (DESYREL) 150 MG tablet Take 150 mg by mouth at bedtime as needed for sleep.   Yes [provider]    Family History Family History  Problem Relation Age of Onset  . Heart disease Mother   . Lung cancer Mother   . Heart disease Father   . Malignant hyperthermia Father   . COPD Father   . Throat cancer Sister   . Esophageal cancer Sister   . Hypertension Other   . COPD Other   . Colon cancer Neg Hx   . Colon polyps Neg Hx   . Rectal cancer Neg Hx   . Stomach cancer Neg  Hx     Social History Social History   Tobacco Use  . Smoking status: Current Every Day Smoker    Packs/day: 0.50    Years: 43.00    Pack years: 21.50    Types: Cigarettes    Start date: 08/13/1973  . Smokeless tobacco: Never Used  Substance Use Topics  . Alcohol use: Not Currently    Frequency: Never    Comment: quit drinking in 2017  . Drug use: Yes    Types: Marijuana    Comment: quit in 2017"     Allergies   Morphine and related; Aspirin; Clonidine derivatives; Tramadol; and Tylenol [acetaminophen]   Review of Systems Review of Systems  Constitutional: Negative for chills and fever.  HENT: Negative for ear pain and sore throat.   Eyes: Negative for pain and visual disturbance.  Respiratory: Positive for shortness of breath. Negative for cough.   Cardiovascular: Positive for chest pain (burning epigastric pain). Negative for palpitations.  Gastrointestinal: Positive for abdominal distention, abdominal pain and nausea. Negative for vomiting.  Genitourinary: Negative for dysuria and hematuria.  Musculoskeletal: Negative for arthralgias and back pain.  Skin: Negative for color change and rash.  Neurological: Negative for seizures and syncope.  All other systems reviewed and are negative.    Physical Exam Updated Vital Signs BP 125/81 (BP Location: Left Arm)   Pulse 93   Temp 98.3 F (36.8 C) (Oral)   Resp 18   Ht 5\' 9"  (1.753 m)   Wt 69.9 kg   SpO2 94%   BMI 22.76 kg/m   Physical Exam  Constitutional: He appears well-developed and well-nourished.  HENT:  Head: Normocephalic and atraumatic.  Eyes: Conjunctivae are normal.  Neck: Neck supple.  Cardiovascular: Normal rate and regular rhythm.  No murmur heard. Pulmonary/Chest: Effort normal and breath sounds normal. No respiratory distress.  Abdominal: Soft. He exhibits distension and ascites. There is no tenderness.  Musculoskeletal: He exhibits no edema.  Neurological: He is alert.  Skin: Skin is warm  and dry.  Psychiatric: He  has a normal mood and affect.  Nursing note and vitals reviewed.    ED Treatments / Results  Labs (all labs ordered are listed, but only abnormal results are displayed) Labs Reviewed  CBC - Abnormal; Notable for the following components:      Result Value   RBC 3.88 (*)    Hemoglobin 10.6 (*)    HCT 30.3 (*)    Platelets 108 (*)    All other components within normal limits  COMPREHENSIVE METABOLIC PANEL - Abnormal; Notable for the following components:   Sodium 134 (*)    Chloride 93 (*)    Glucose, Bld 146 (*)    Creatinine, Ser 5.03 (*)    Calcium 7.9 (*)    Total Protein 6.3 (*)    Albumin 3.4 (*)    GFR calc non Af Amer 12 (*)    GFR calc Af Amer 14 (*)    All other components within normal limits  BASIC METABOLIC PANEL - Abnormal; Notable for the following components:   Sodium 134 (*)    Potassium 5.6 (*)    Chloride 92 (*)    Creatinine, Ser 5.58 (*)    Calcium 8.0 (*)    GFR calc non Af Amer 10 (*)    GFR calc Af Amer 12 (*)    All other components within normal limits  CBC - Abnormal; Notable for the following components:   RBC 4.08 (*)    Hemoglobin 10.8 (*)    HCT 31.9 (*)    Platelets 118 (*)    All other components within normal limits  PROTIME-INR - Abnormal; Notable for the following components:   Prothrombin Time 15.7 (*)    All other components within normal limits  LIPASE, BLOOD  MAGNESIUM  TSH  APTT  I-STAT TROPONIN, ED    EKG EKG Interpretation  Date/Time:  Friday March 10 2018 17:37:58 EDT Ventricular Rate:  91 PR Interval:    QRS Duration: 173 QT Interval:  463 QTC Calculation: 570 R Axis:   -118 Text Interpretation:  Sinus rhythm Borderline short PR interval LBBB Consider left ventricular hypertrophy No significant change was found Confirmed by Jola Schmidt [phone]) on 03/10/2018 5:40:40 PM   Radiology Dg Chest 2 View  Result Date: 03/10/2018 CLINICAL DATA:  Shortness of breath. EXAM: CHEST - 2 VIEW  COMPARISON:  Chest x-ray dated March 06, 2018. FINDINGS: Interval removal of the right internal jugular dialysis catheter. Stable cardiomegaly. Mild pulmonary vascular congestion with resolved interstitial edema. No focal consolidation, pleural effusion, or pneumothorax. No acute osseous abnormality. IMPRESSION: Mild pulmonary vascular congestion with resolved interstitial edema. Electronically Signed   By: Titus Dubin M.D.   On: 03/10/2018 20:06    Procedures Procedures (including critical care time)  Medications Ordered in ED Medications  metoprolol tartrate (LOPRESSOR) tablet 25 mg (25 mg Oral Given 03/11/18 0948)  oxyCODONE (Oxy IR/ROXICODONE) immediate release tablet 5 mg (has no administration in time range)  sulfamethoxazole-trimethoprim (BACTRIM DS,SEPTRA DS) 800-160 MG per tablet 1 tablet (1 tablet Oral Given 03/11/18 0948)  hydrOXYzine (ATARAX/VISTARIL) tablet 25-50 mg (has no administration in time range)  traZODone (DESYREL) tablet 150 mg (has no administration in time range)  lactulose (CHRONULAC) 10 GM/15ML solution 30 g (30 g Oral Given 03/11/18 0953)  gabapentin (NEURONTIN) capsule 100 mg (100 mg Oral Given 03/11/18 0948)  pantoprazole (PROTONIX) EC tablet 40 mg (has no administration in time range)  aspirin EC tablet 81 mg (81 mg Oral Given 03/11/18 0948)  albuterol (PROVENTIL) (2.5 MG/3ML) 0.083% nebulizer solution 2.5 mg (has no administration in time range)  dextromethorphan-guaiFENesin (MUCINEX DM) 30-600 MG per 12 hr tablet 1 tablet (has no administration in time range)  nicotine (NICODERM CQ - dosed in mg/24 hours) patch 21 mg (21 mg Transdermal Not Given 03/11/18 0948)  heparin injection 5,000 Units (5,000 Units Subcutaneous Not Given 03/11/18 0600)  hydrALAZINE (APRESOLINE) injection 5 mg (has no administration in time range)  hydrOXYzine (ATARAX/VISTARIL) tablet 10 mg (has no administration in time range)  zolpidem (AMBIEN) tablet 5 mg (has no administration in time  range)  sevelamer carbonate (RENVELA) tablet 3,200 mg (3,200 mg Oral Given 03/11/18 0829)  sevelamer carbonate (RENVELA) tablet 1,600 mg (has no administration in time range)  fentaNYL (SUBLIMAZE) injection 50 mcg (50 mcg Intravenous Given 03/10/18 2218)     Initial Impression / Assessment and Plan / ED Course  I have reviewed the triage vital signs and the nursing notes.  Pertinent labs & imaging results that were available during my care of the patient were reviewed by me and considered in my medical decision making (see chart for details).     The patient is a 47yoM with PMH ESRD on iHD (MWF, received full session of dialysis today and took down to his dry weight of approximately 67kg per patient) who presents via EMS for shortness of breath. The patient was found by EMS to have HR 190 and EKG showed wide complex tachycardia. Patient was given 6mg  adenosine and 12mg  adenosine x2, with no change and then was given 150mg  amiodarone, at which point he converted to sinus rhythm. EKG here shows sinus rhythm with LBBB. Patient reports that he had an episode of burning chest/epigastric pain that has resolved. He endorses abdominal pain, nausea, and diarrhea, but denies fevers. Physical exam reveals no abdominal tenderness. CBC shows no leukocytosis. Do not suspect SBP. CMP shows no hyperkalemia. Review of EMS strip concerning for VT and consulted cardiology who recommends admission. Patient admitted to internal medicine service due to multiple medical comorbidities.  Patient care supervised by Dr. Venora Maples.  Irven Baltimore, MD  Final Clinical Impressions(s) / ED Diagnoses   Final diagnoses:  Wide-complex tachycardia [facility])    ED Discharge Orders    None       Irven Baltimore, MD 03/11/18 [id number]    Jola Schmidt, MD 03/13/18 2310

## 2018-03-10 NOTE — Consult Note (Addendum)
Reason for Consult: Wide complex tachycardia Referring Physician: [name]  Dawit Tankard is an 57 y.o. male.  HPI:   57 y/o [place] male with history of severe COPD, known coronary artery disease with mid RCA PCI 02/2017, hypertensive cardiomyopathy, dietary and medication noncompliance, end-stage renal disease on hemodialysis, chronic hepatitis B and C, COPD, tobacco and marijuana use, prior cocaine use quit in 2017, prior h/o GI bleed, mod calcific mitral valve disease, pulmonary hypertension.  Patient called EMS today with complaints of shortness of breath. EMF found him to be in wide complex tachycardia. Reportedly, adenosine IV was used without change in the rhythm. I have very short strip from EMS available for review, where the rhythm converted from wide complex regular tachycardia to wide complex regular rhythm with similar QRS morphology. He is on no anticoagulation due noncompliance.  Patient currently resting without any symptoms of chest pain, shortness of breath. Labs are pending at this time. He is compliant with dialysis. He has Hep B,C cirrhosis, requiring recurrent paracentesis for ascites.   Past Medical History:  Diagnosis Date  . Anemia   . Anxiety   . Arthritis    left shoulder  . Atherosclerosis of aorta ([place])   . Cardiomegaly   . Chest pain    DATE UNKNOWN, C/O PERIODICALLY  . Cocaine abuse ([place])   . COPD exacerbation ([place]) 08/17/2016  . Coronary artery disease    stent 02/22/17  . ESRD (end stage renal disease) on dialysis ([place])    "[name]; MWF" (07/04/2017)  . GERD (gastroesophageal reflux disease)    DATE UNKNOWN  . Hemorrhoids   . Hepatitis B, chronic ([place])   . Hepatitis C   . History of kidney stones   . Hyperkalemia   . Hypertension   . Kidney failure   . Metabolic bone disease    Patient denies  . Mitral stenosis   . Myocardial infarction ([place])   . Pneumonia   . Pulmonary edema   . Solitary rectal ulcer syndrome  07/2017   at flex sig for rectal bleeding  . Tubular adenoma of colon     Past Surgical History:  Procedure Laterality Date  . A/V FISTULAGRAM Left 05/26/2017   Procedure: A/V FISTULAGRAM;  Surgeon: Conrad Evergreen, MD;  Location: [place] [facility];  Service: Cardiovascular;  Laterality: Left;  . A/V FISTULAGRAM Right 11/18/2017   Procedure: A/V FISTULAGRAM - Right Arm;  Surgeon: Elam Dutch, MD;  Location: [place] [facility];  Service: Cardiovascular;  Laterality: Right;  . APPLICATION OF WOUND VAC Left 06/14/2017   Procedure: APPLICATION OF WOUND VAC;  Surgeon: Katha Cabal, MD;  Location: [facility];  Service: Vascular;  Laterality: Left;  . AV FISTULA PLACEMENT  2012   BELIEVED WAS PLACED IN JUNE  . AV FISTULA PLACEMENT Right 08/09/2017   Procedure: Creation Right arm ARTERIOVENOUS BRACHIOCEPOHALIC FISTULA;  Surgeon: Elam Dutch, MD;  Location: [facility] OR;  Service: Vascular;  Laterality: Right;  . AV FISTULA PLACEMENT Right 11/22/2017   Procedure: INSERTION OF ARTERIOVENOUS (AV) GORE-TEX GRAFT RIGHT UPPER ARM;  Surgeon: Elam Dutch, MD;  Location: [facility] [place];  Service: Vascular;  Laterality: Right;  . BIOPSY  01/25/2018   Procedure: BIOPSY;  Surgeon: Jerene Bears, MD;  Location: [facility];  Service: Gastroenterology;;  . COLONOSCOPY    . COLONOSCOPY WITH PROPOFOL N/A 01/25/2018   Procedure: COLONOSCOPY WITH PROPOFOL;  Surgeon: Jerene Bears, MD;  Location: [place];  Service:  Gastroenterology;  Laterality: N/A;  . CORONARY STENT INTERVENTION N/A 02/22/2017   Procedure: CORONARY STENT INTERVENTION;  Surgeon: Nigel Mormon, MD;  Location: [place] [facility];  Service: Cardiovascular;  Laterality: N/A;  . ESOPHAGOGASTRODUODENOSCOPY (EGD) WITH PROPOFOL N/A 01/25/2018   Procedure: ESOPHAGOGASTRODUODENOSCOPY (EGD) WITH PROPOFOL;  Surgeon: Jerene Bears, MD;  Location: [place];  Service: Gastroenterology;  Laterality: N/A;  . FLEXIBLE SIGMOIDOSCOPY N/A 07/15/2017    Procedure: FLEXIBLE SIGMOIDOSCOPY;  Surgeon: Carol Ada, MD;  Location: [place];  Service: Endoscopy;  Laterality: N/A;  . HEMORRHOID BANDING    . I&D EXTREMITY Left 06/01/2017   Procedure: IRRIGATION AND DEBRIDEMENT LEFT ARM HEMATOMA WITH LIGATION OF LEFT ARM AV FISTULA;  Surgeon: Elam Dutch, MD;  Location: [place];  Service: Vascular;  Laterality: Left;  . I&D EXTREMITY Left 06/14/2017   Procedure: IRRIGATION AND DEBRIDEMENT EXTREMITY;  Surgeon: Katha Cabal, MD;  Location: [facility];  Service: Vascular;  Laterality: Left;  . INSERTION OF DIALYSIS CATHETER  05/30/2017  . INSERTION OF DIALYSIS CATHETER N/A 05/30/2017   Procedure: INSERTION OF DIALYSIS CATHETER;  Surgeon: Elam Dutch, MD;  Location: [place];  Service: Vascular;  Laterality: N/A;  . IR PARACENTESIS  08/30/2017  . IR PARACENTESIS  09/29/2017  . IR PARACENTESIS  10/28/2017  . IR PARACENTESIS  11/09/2017  . IR PARACENTESIS  11/16/2017  . IR PARACENTESIS  11/28/2017  . IR PARACENTESIS  12/01/2017  . IR PARACENTESIS  12/06/2017  . IR PARACENTESIS  01/03/2018  . IR PARACENTESIS  01/23/2018  . IR PARACENTESIS  02/07/2018  . IR PARACENTESIS  02/21/2018  . IR PARACENTESIS  03/06/2018  . IR RADIOLOGIST EVAL & MGMT  02/14/2018  . LEFT HEART CATH AND CORONARY ANGIOGRAPHY N/A 02/22/2017   Procedure: LEFT HEART CATH AND CORONARY ANGIOGRAPHY;  Surgeon: Nigel Mormon, MD;  Location: [place] [facility];  Service: Cardiovascular;  Laterality: N/A;  . LIGATION OF ARTERIOVENOUS  FISTULA Left 10/12/5954   Procedure: Plication of Left Arm Arteriovenous Fistula;  Surgeon: Elam Dutch, MD;  Location: [place];  Service: Vascular;  Laterality: Left;  . POLYPECTOMY    . POLYPECTOMY  01/25/2018   Procedure: POLYPECTOMY;  Surgeon: Jerene Bears, MD;  Location: [place];  Service: Gastroenterology;;  . REVISON OF ARTERIOVENOUS FISTULA Left 3/87/5643   Procedure: PLICATION OF DISTAL ANEURYSMAL SEGEMENT OF LEFT UPPER ARM ARTERIOVENOUS  FISTULA;  Surgeon: Elam Dutch, MD;  Location: [place];  Service: Vascular;  Laterality: Left;  . REVISON OF ARTERIOVENOUS FISTULA Left 10/08/5186   Procedure: Plication of Left Upper Arm Fistula ;  Surgeon: Waynetta Sandy, MD;  Location: [place];  Service: Vascular;  Laterality: Left;  . SKIN GRAFT SPLIT THICKNESS LEG / FOOT Left    SKIN GRAFT SPLIT THICKNESS LEFT ARM DONOR SITE: LEFT ANTERIOR THIGH  . SKIN SPLIT GRAFT Left 07/04/2017   Procedure: SKIN GRAFT SPLIT THICKNESS LEFT ARM DONOR SITE: LEFT ANTERIOR THIGH;  Surgeon: Elam Dutch, MD;  Location: [place];  Service: Vascular;  Laterality: Left;  . THROMBECTOMY W/ EMBOLECTOMY Left 06/05/2017   Procedure: EXPLORATION OF LEFT ARM FOR BLEEDING; OVERSEWED PROXIMAL FISTULA;  Surgeon: Angelia Mould, MD;  Location: [place];  Service: Vascular;  Laterality: Left;  . WOUND EXPLORATION Left 06/03/2017   Procedure: WOUND EXPLORATION WITH WOUND VAC APPLICATION TO LEFT ARM;  Surgeon: Angelia Mould, MD;  Location: [place];  Service: Vascular;  Laterality: Left;    Family History  Problem  Relation Age of Onset  . Heart disease Mother   . Lung cancer Mother   . Heart disease Father   . Malignant hyperthermia Father   . COPD Father   . Throat cancer Sister   . Esophageal cancer Sister   . Hypertension Other   . COPD Other   . Colon cancer Neg Hx   . Colon polyps Neg Hx   . Rectal cancer Neg Hx   . Stomach cancer Neg Hx     Social History:  reports that he has been smoking cigarettes. He started smoking about 44 years ago. He has a 21.50 pack-year smoking history. He has never used smokeless tobacco. He reports that he drank alcohol. He reports that he has current or past drug history. Drug: Marijuana.  Allergies:  Allergies  Allergen Reactions  . Morphine And Related Other (See Comments)    Stomach pain  . Aspirin Other (See Comments)    STOMACH PAIN  . Clonidine Derivatives Itching  . Tramadol Itching  .  Tylenol [Acetaminophen] Nausea Only    Stomach ache    Medications: I have reviewed the patient's current medications.  Results for orders placed or performed during the hospital encounter of 03/10/18 (from the past 48 hour(s))  I-Stat Troponin, ED (not at [facility])     Status: None   Collection Time: 03/10/18  7:36 PM  Result Value Ref Range   Troponin i, poc 0.03 0.00 - 0.08 ng/mL   Comment 3            Comment: Due to the release kinetics of cTnI, a negative result within the first hours of the onset of symptoms does not rule out myocardial infarction with certainty. If myocardial infarction is still suspected, repeat the test at appropriate intervals.     No results found.  Review of Systems  Constitutional: Positive for malaise/fatigue (Chronic).  HENT: Negative.   Eyes: Negative.   Respiratory: Positive for shortness of breath (Now resolved).   Cardiovascular: Negative for chest pain and leg swelling.  Gastrointestinal: Positive for abdominal pain (Now resolved).  Genitourinary: Negative.   Musculoskeletal: Negative.   Neurological: Negative for loss of consciousness.  Endo/Heme/Allergies: Does not bruise/bleed easily.  Psychiatric/Behavioral: The patient is not nervous/anxious.   All other systems reviewed and are negative.  Blood pressure 113/74, pulse (!) 103, temperature 97.7 F (36.5 C), temperature source Oral, resp. rate 13, height 5\' 9"  (1.753 m), weight 69.9 kg, SpO2 91 %. Physical Exam  Nursing note and vitals reviewed. Constitutional: He is oriented to person, place, and time. He appears well-developed and well-nourished.  HENT:  Head: Normocephalic and atraumatic.  Neck: JVD present.  Cardiovascular: Normal rate.  Murmur (II/VI holosystolic and early diastolic murmur apex. B/l carotid bruit. Absent LE distant pulses) heard. RUE AV fistula  Respiratory: Effort normal and breath sounds normal. He has no wheezes. He has no rales.  GI: Bowel sounds are  normal.  Mild ascites  Musculoskeletal: Normal range of motion. He exhibits edema.  Lymphadenopathy:    He has no cervical adenopathy.  Neurological: He is alert and oriented to person, place, and time. No cranial nerve deficit.  Skin: Skin is warm and dry.  Psychiatric: He has a normal mood and affect.   Cardiac studies: EKG 03/10/2018: Sinus rhythm LBBB EMS strips reveal Wide complex regular tachycardia around 170 bpm. QRS morphology similar to baseline QRS morphology. Differentials include VT vs Aflutter with aberrant conduction.  Echocardiogram 08/30/2017: Study Conclusions  - Left  ventricle: The cavity size was normal. There was severe   concentric hypertrophy. Systolic function was normal. The   estimated ejection fraction was in the range of 55% to 60%.   Hypokinesis of the anteroseptal and inferoseptal myocardium. - Ventricular septum: The contour showed diastolic flattening and   systolic flattening consistent with right ventricular pressure   and volume overload. - Aortic valve: Transvalvular velocity was within the normal range.   There was no stenosis. There was no regurgitation. - Mitral valve: Calcified annulus. Moderately thickened leaflets .   Mobility was restricted. The findings are consistent with severe   stenosis. There was mild regurgitation. Mean gradient (D): 16 mm   Hg. Valve area by pressure half-time: 2.01 cm^2. - Left atrium: The atrium was severely dilated. - Right ventricle: The cavity size was severely dilated. Wall   thickness was normal. Systolic function was normal. - Right atrium: The atrium was severely dilated. - Atrial septum: No defect or patent foramen ovale was identified   by color flow Doppler. - Tricuspid valve: There was moderate regurgitation. - Pulmonary arteries: PA peak pressure: 91 mm Hg (S). - Pericardium, extracardiac: A moderate pericardial effusion was   identified along the left ventricular free wall measuring up to    1.64 cm. Unchanged compared with the echo 05/2017. There was no   chamber collapse. There was evidence for increased RV-LV   interaction demonstrated by respirophasic changes in transmitral   velocities and tricuspid velocities. The possibility of   hemodynamic compromise cannot be excluded on the basis of   available images. - Impressions: Severe mitral stenosis by mean gradient. However,   calculated valve area indicates mild stenosis. The leaflets are   restricted but there does not appear to be severe stenosis.  Impressions:  - Severe mitral stenosis by mean gradient. However, calculated   valve area indicates mild stenosis. The leaflets are restricted   but there does not appear to be severe stenosis.  Assessment/Recommendations:  57 y/o [place] American male with history of severe COPD, known coronary artery disease with mid RCA PCI 02/2017, hypertensive cardiomyopathy, dietary and medication noncompliance, end-stage renal disease on hemodialysis, chronic hepatitis B and C, COPD, tobacco and marijuana use, prior cocaine use quit in 2017, prior h/o GI bleed, mod calcific mitral valve disease, pulmonary hypertension.  Wide complex tachycardia: Limited telemetry strips available for review which reveal wide complex regular tachycardia around 170 bpm. QRS morphology similar to baseline QRS morphology. Differentials include VT vs Aflutter with aberrant conduction. Given monomorphic nature, I do not suspect ischemic VT. His treatment options for maintenance long term antiarrythmic therapy are limited due severe renal and liver disease. Recommend switching diltiazem 120 mg to metoprolol 25 mg PO bid. Acute episodes of wide complex tachycardia could be treated with IV amiodarone. Not a candidate for long term amiodarone therapy.  Paroxysmal Afib/flutter: CHA2DS2VASc score 4, annual stroke risk 4.8% Rate control. Not on anticoagulation due to h/o noncompliance with warfarin. Eliquis is an  option, however, his bleeding risk is high due to his cirrhosis.  CAD: Stable. Recommend ASA 81 mg. Statin, if tolerated.   Pulmonary hypertension: Echocardiogram in am. PH is multifactorial, including CAD, ESRD, cirrhosis.    Management of other comorbidities per the primary team. Consider obs admission to medical service given patient's multiple comorbidities.   His cardiovascular and overall prognosis is poor.   Jaicey Sweaney J Jeymi Hepp 03/10/2018, 7:55 PM   [place], MD [facility] Pager: [phone] Office: [phone] If  no answer Cell [phone]

## 2018-03-10 NOTE — ED Notes (Signed)
Cardiology in to assess pt for admission

## 2018-03-10 NOTE — ED Notes (Signed)
Pt to xray at this time.

## 2018-03-11 ENCOUNTER — Other Ambulatory Visit (HOSPITAL_COMMUNITY): Payer: Medicare Other

## 2018-03-11 ENCOUNTER — Encounter (HOSPITAL_COMMUNITY): Payer: Self-pay

## 2018-03-11 ENCOUNTER — Observation Stay (HOSPITAL_COMMUNITY): Payer: Medicare Other

## 2018-03-11 ENCOUNTER — Other Ambulatory Visit: Payer: Self-pay

## 2018-03-11 DIAGNOSIS — I472 Ventricular tachycardia: Secondary | ICD-10-CM

## 2018-03-11 DIAGNOSIS — D631 Anemia in chronic kidney disease: Secondary | ICD-10-CM | POA: Diagnosis not present

## 2018-03-11 DIAGNOSIS — N186 End stage renal disease: Secondary | ICD-10-CM

## 2018-03-11 DIAGNOSIS — Z992 Dependence on renal dialysis: Secondary | ICD-10-CM

## 2018-03-11 DIAGNOSIS — I10 Essential (primary) hypertension: Secondary | ICD-10-CM

## 2018-03-11 DIAGNOSIS — I2510 Atherosclerotic heart disease of native coronary artery without angina pectoris: Secondary | ICD-10-CM | POA: Diagnosis present

## 2018-03-11 DIAGNOSIS — N189 Chronic kidney disease, unspecified: Secondary | ICD-10-CM | POA: Diagnosis not present

## 2018-03-11 DIAGNOSIS — R000 Tachycardia, unspecified: Secondary | ICD-10-CM | POA: Diagnosis not present

## 2018-03-11 LAB — BASIC METABOLIC PANEL
Anion gap: 10 (ref 5–15)
BUN: 15 mg/dL (ref 6–20)
CO2: 32 mmol/L (ref 22–32)
Calcium: 8 mg/dL — ABNORMAL LOW (ref 8.9–10.3)
Chloride: 92 mmol/L — ABNORMAL LOW (ref 98–111)
Creatinine, Ser: 5.58 mg/dL — ABNORMAL HIGH (ref 0.61–1.24)
GFR calc Af Amer: 12 mL/min — ABNORMAL LOW (ref >60)
GFR calc non Af Amer: 10 mL/min — ABNORMAL LOW (ref >60)
Glucose, Bld: 89 mg/dL (ref 70–99)
Potassium: 5.6 mmol/L — ABNORMAL HIGH (ref 3.5–5.1)
Sodium: 134 mmol/L — ABNORMAL LOW (ref 135–145)

## 2018-03-11 LAB — CBC
HCT: 31.9 % — ABNORMAL LOW (ref 39.0–52.0)
Hemoglobin: 10.8 g/dL — ABNORMAL LOW (ref 13.0–17.0)
MCH: 26.5 pg (ref 26.0–34.0)
MCHC: 33.9 g/dL (ref 30.0–36.0)
MCV: 78.2 fL (ref 78.0–100.0)
Platelets: 118 10*3/uL — ABNORMAL LOW (ref 150–400)
RBC: 4.08 MIL/uL — ABNORMAL LOW (ref 4.22–5.81)
RDW: 14.6 % (ref 11.5–15.5)
WBC: 6.6 10*3/uL (ref 4.0–10.5)

## 2018-03-11 LAB — CULTURE, BODY FLUID W GRAM STAIN -BOTTLE: Culture: NO GROWTH

## 2018-03-11 LAB — PROTIME-INR
INR: 1.26
Prothrombin Time: 15.7 seconds — ABNORMAL HIGH (ref 11.4–15.2)

## 2018-03-11 LAB — TSH: TSH: 1.324 u[IU]/mL (ref 0.350–4.500)

## 2018-03-11 LAB — APTT: aPTT: 32 seconds (ref 24–36)

## 2018-03-11 MED ORDER — PANTOPRAZOLE SODIUM 40 MG PO TBEC: 40.0000 mg | DELAYED_RELEASE_TABLET | Freq: Every day | ORAL | 0 refills | Status: DC

## 2018-03-11 MED ORDER — METOPROLOL TARTRATE 25 MG PO TABS: 25.0000 mg | ORAL_TABLET | Freq: Two times a day (BID) | ORAL | 1 refills | Status: DC

## 2018-03-11 MED ORDER — ASPIRIN 81 MG PO TBEC: 81.0000 mg | DELAYED_RELEASE_TABLET | Freq: Every day | ORAL | 0 refills | Status: DC

## 2018-03-11 MED ORDER — METOPROLOL TARTRATE 25 MG PO TABS: 25.0000 mg | ORAL_TABLET | Freq: Two times a day (BID) | ORAL | 0 refills | Status: DC

## 2018-03-11 MED ORDER — HYDRALAZINE HCL 100 MG PO TABS: 100.0000 mg | ORAL_TABLET | Freq: Two times a day (BID) | ORAL | 0 refills | Status: DC | PRN

## 2018-03-11 MED ORDER — LACTULOSE 10 GM/15ML PO SOLN: 30.0000 g | Freq: Two times a day (BID) | ORAL | 0 refills | Status: DC

## 2018-03-11 MED ORDER — LACTULOSE 10 GM/15ML PO SOLN: 30.0000 g | Freq: Two times a day (BID) | ORAL | 3 refills | Status: DC

## 2018-03-11 NOTE — Procedures (Signed)
Advised by nurse not to do echo today since patient was going to have echo done as an outpatient.

## 2018-03-11 NOTE — Discharge Summary (Addendum)
Physician Discharge Summary  [name] GBT:[id number] DOB: Jan 03, 1961 DOA: 03/10/2018  PCP: [name], MD  Admit date: 03/10/2018 Discharge date: 03/11/2018  Time spent: 65 minutes  Recommendations for Outpatient Follow-up:  1. Follow up with cardiology 1 week for evaluation of ekg and to arrange for OP echo 2. Dialysis as scheduled 3. Take medications as ordered   Discharge Diagnoses:  Principal Problem:   Wide-complex tachycardia ([place]) Active Problems:   Ascites   Abdominal pain   Thrombocytopenia (HCC)   Essential hypertension   Hyperkalemia   Hypertension   Atrial fibrillation (HCC)   COPD (chronic obstructive pulmonary disease) (HCC)   Alcohol abuse   Anemia due to chronic kidney disease   ESRD on dialysis [facility])   Liver cirrhosis (HCC)   CAD (coronary artery disease)   Discharge Condition: stable  Diet recommendation: heart healthy, renal  Filed Weights   03/10/18 1745  Weight: 69.9 kg    History of present illness:  [name] is a 57 y.o. male with medical history significant of alcohol abusein remission,HBV, HCV,cirrhosis with ascites,GIB, SBP,hypertension, COPD, GERD, anxiety, anemia, polysubstance abuse (tobacco, cocaine and marijuana), CAD, ESRD-HD (MWF),A fib/A flutter not on AC,pulmonary hypertension, who presented with abdominal painandSOB.  Pt stated that he started having SOB after he finished HD on day of admission.  Patient denied cough and CP, but stated that he had tested burning sensation which resolved.  No fever or chills. He stated that he has chronic abdominal pain, which is located in the lower abdomen, constant, sharp, 8 out of 10 severity, nonradiating.  He stated that this is chronic issue, no change in nature or intensity.  He had nausea, but no vomiting.  He stated that he had one loose stool bowel movement earlier.   He denied symptoms of UTI. Per report, EMF found him to be in wide complex  tachycardia with HR up to 170s. Reportedly, adenosine IV was used without change in the rhythm. Symptoms improved after given amiodarone. No unilateral weakness.  Hospital Course:  Wide-complex tachycardia [facility]): Pt had wide complex regular tachycardia with HR up to 170 bpm, which has resolved with amiodarone treatment. Evaluated by  Card, Dr. Virgina Jock who opined patient did not have V-tach. He recommend switching diltiazem 120 mg to metoprolol 25 mg PO bid. Per Dr. Virgina Jock, "acute episodes of wide complex tachycardia could be treated with IV amiodarone. Not a candidate for long term amiodarone therapy. Pt had no CP.  recommended echo and follow up cardiology in 1 week. TSH 1.3 at discharge. Dr Virgina Jock said his office can arrange for OP echo  Atrialflutter:CHA2DS2-VASc Scoreis 2, needs oral anticoagulation, but pt is not good candidate for [facility] due high risk of bleeding 2/2 livercirrhosis and thrombocytopenia.rate controlled at discharge. Discharge with new BB and stopped cardizem.  Hx CAD: no CP. Cardiology recommends asa and statin if tolerates  Tobacco abuse  -Nicotine patch  HTN: -fair control. hydralazine and metoprolol   COPD (chronic obstructive pulmonary disease) (HCC):no wheezing orrhonchi on auscultation. -stable  Anemia due to chronic kidney disease: hgb stable.Hemoglobin 10.6   ESRD (end stage renal disease) on dialysis (MWF):had HD on day of admission.potassium 5.6 on day of discharge. Continue with regular dialysis schedule  Liver cirrhosis (HCC):Mental status normal. -continue home lactulose 30 g twice daily  Abdominal pain: pt states that his abdominal pain is chronic issue, no change in nature.  Lipase normal. pt is on Bactrim chronically. Low suspicions for SBP. Resolved at discharge  Alcohol abuse: -in remission  Procedures:  Echo cardiogram to be done as an outpatient  Consultations:  Dr Virgina Jock cardiology  Discharge  Exam: Vitals:   03/11/18 0700 03/11/18 1120  BP: 129/84 125/81  Pulse: (!) 109 93  Resp: 18 18  Temp: 98.3 F (36.8 C) 98.3 F (36.8 C)  SpO2: 96% 94%    General: awake alert in no acute distress Cardiovascular: rrr  + murmur  trace Le edema Respiratory: normal effort BS clear, no wheeze no rales  Discharge Instructions   Discharge Instructions    Call MD for:  difficulty breathing, headache or visual disturbances   Complete by:  As directed    Call MD for:  persistant dizziness or light-headedness   Complete by:  As directed    Call MD for:  persistant nausea and vomiting   Complete by:  As directed    Call MD for:  temperature >100.4   Complete by:  As directed    Diet - low sodium heart healthy   Complete by:  As directed    Increase activity slowly   Complete by:  As directed      Allergies as of 03/11/2018      Reactions   Morphine And Related Other (See Comments)   Stomach pain   Aspirin Other (See Comments)   STOMACH PAIN   Clonidine Derivatives Itching   Tramadol Itching   Tylenol [acetaminophen] Nausea Only   Stomach ache      Medication List    STOP taking these medications   diltiazem 120 MG 24 hr capsule Commonly known as:  CARDIZEM CD     TAKE these medications   aspirin 81 MG EC tablet Take 1 tablet (81 mg total) by mouth daily. Start taking on:  03/12/2018   gabapentin 100 MG capsule Commonly known as:  NEURONTIN Take 100 mg by mouth 2 (two) times daily.   hydrALAZINE 100 MG tablet Commonly known as:  APRESOLINE Take 1 tablet (100 mg total) by mouth 2 (two) times daily as needed. Taking after HD on Dialysis days   hydrOXYzine 25 MG capsule Commonly known as:  VISTARIL Take 25-50 mg by mouth at bedtime as needed for anxiety.   lactulose 10 GM/15ML solution Commonly known as:  CHRONULAC Take 45 mLs (30 g total) by mouth 2 (two) times daily.   metoprolol tartrate 25 MG tablet Commonly known as:  LOPRESSOR Take 1 tablet (25 mg total)  by mouth 2 (two) times daily.   oxyCODONE 5 MG immediate release tablet Commonly known as:  Oxy IR/ROXICODONE Take 5 mg by mouth 2 (two) times daily as needed for pain.   pantoprazole 40 MG tablet Commonly known as:  PROTONIX Take 1 tablet (40 mg total) by mouth daily at 12 noon. Start taking on:  03/12/2018   sevelamer carbonate 800 MG tablet Commonly known as:  RENVELA Take 4 tablets with meals  And 2 tablets twice daily with snacks What changed:    how much to take  how to take this  when to take this  additional instructions   sulfamethoxazole-trimethoprim 800-160 MG tablet Commonly known as:  BACTRIM DS,SEPTRA DS Take 1 tablet by mouth 2 (two) times daily.   traZODone 150 MG tablet Commonly known as:  DESYREL Take 150 mg by mouth at bedtime as needed for sleep.   VENTOLIN HFA 108 (90 Base) MCG/ACT inhaler Generic drug:  albuterol Inhale 2 puffs into the lungs every 6 (six) hours as needed for  wheezing or shortness of breath.      Allergies  Allergen Reactions  . Morphine And Related Other (See Comments)    Stomach pain  . Aspirin Other (See Comments)    STOMACH PAIN  . Clonidine Derivatives Itching  . Tramadol Itching  . Tylenol [Acetaminophen] Nausea Only    Stomach ache      The results of significant diagnostics from this hospitalization (including imaging, microbiology, ancillary and laboratory) are listed below for reference.    Significant Diagnostic Studies: Dg Chest 2 View  Result Date: 03/10/2018 CLINICAL DATA:  Shortness of breath. EXAM: CHEST - 2 VIEW COMPARISON:  Chest x-ray dated March 06, 2018. FINDINGS: Interval removal of the right internal jugular dialysis catheter. Stable cardiomegaly. Mild pulmonary vascular congestion with resolved interstitial edema. No focal consolidation, pleural effusion, or pneumothorax. No acute osseous abnormality. IMPRESSION: Mild pulmonary vascular congestion with resolved interstitial edema. Electronically  Signed   By: Titus Dubin M.D.   On: 03/10/2018 20:06   Dg Chest 2 View  Result Date: 03/06/2018 CLINICAL DATA:  Shortness of breath, EXAM: CHEST - 2 VIEW COMPARISON:  02/17/2018 FINDINGS: Right dialysis catheter remains in place, unchanged. Cardiomegaly. Slight interstitial prominence, improved since prior study, likely improving interstitial edema. Bibasilar atelectasis. No effusions or acute bony abnormality. IMPRESSION: Improving interstitial edema pattern.  Bibasilar atelectasis. Electronically Signed   By: Rolm Baptise M.D.   On: 03/06/2018 19:55   Dg Chest 2 View  Result Date: 02/17/2018 CLINICAL DATA:  Shortness of breath tonight EXAM: CHEST - 2 VIEW COMPARISON:  February 14, 2018 FINDINGS: The heart size and mediastinal contours are stable. The heart size is enlarged. Dual lumen central venous line is identified distal tip in the right atrium. There is interstitial pulmonary edema. There is no focal pneumonia or pleural effusion. The visualized skeletal structures are stable. IMPRESSION: Congestive heart failure. Electronically Signed   By: Abelardo Diesel M.D.   On: 02/17/2018 20:59   Dg Chest Port 1 View  Result Date: 02/14/2018 CLINICAL DATA:  Respiratory distress.  Current smoker. EXAM: PORTABLE CHEST 1 VIEW COMPARISON:  01/23/2018 FINDINGS: Cardiomegaly with interstitial edema, increased since prior comparison. Right IJ approach dialysis catheter terminates in the right atrium. Moderate aortic atherosclerosis without aneurysmal dilatation noted. No significant effusion. No pulmonary consolidation or pneumothorax. Slight elevation of the left hemidiaphragm. No acute osseous abnormality. IMPRESSION: Cardiomegaly with mild-to-moderate interstitial pulmonary edema, increased since prior. Electronically Signed   By: Ashley Royalty M.D.   On: 02/14/2018 19:10   Ir Radiologist Eval & Mgmt  Result Date: 02/14/2018 Please refer to notes tab for details about interventional procedure. (Op Note)  Ir  Paracentesis  Result Date: 03/06/2018 INDICATION: History of alcoholic cirrhosis. Recurrent ascites. Request for diagnostic and therapeutic paracentesis. EXAM: ULTRASOUND GUIDED LEFT LOWER QUADRANT PARACENTESIS MEDICATIONS: None. COMPLICATIONS: None immediate. PROCEDURE: Informed written consent was obtained from the patient after a discussion of the risks, benefits and alternatives to treatment. A timeout was performed prior to the initiation of the procedure. Initial ultrasound scanning demonstrates a large amount of ascites within the left lower abdominal quadrant. The left lower abdomen was prepped and draped in the usual sterile fashion. 1% lidocaine was used for local anesthesia. Following this, a 19 gauge, 7-cm, Yueh catheter was introduced. An ultrasound image was saved for documentation purposes. The paracentesis was performed. The catheter was removed and a dressing was applied. The patient tolerated the procedure well without immediate post procedural complication. FINDINGS: A total of approximately 4.3  L of clear yellow fluid was removed. Samples were sent to the laboratory as requested by the clinical team. IMPRESSION: Successful ultrasound-guided paracentesis yielding 4.3 liters of peritoneal fluid. Read by: Ascencion Dike PA-C Electronically Signed   By: Corrie Mckusick D.O.   On: 03/06/2018 09:57   Ir Paracentesis  Result Date: 02/21/2018 INDICATION: Patient with history of cirrhosis, ascites. Request is made for diagnostic and therapeutic paracentesis. EXAM: ULTRASOUND GUIDED DIAGNOSTIC AND THERAPEUTIC PARACENTESIS MEDICATIONS: 10 mL 1% lidocaine COMPLICATIONS: None immediate. PROCEDURE: Informed written consent was obtained from the patient after a discussion of the risks, benefits and alternatives to treatment. A timeout was performed prior to the initiation of the procedure. Initial ultrasound scanning demonstrates a small amount of ascites within the right lower abdominal quadrant. The right  lower abdomen was prepped and draped in the usual sterile fashion. 2% lidocaine was used for local anesthesia. Following this, a 19 gauge, 7-cm, Yueh catheter was introduced. An ultrasound image was saved for documentation purposes. The paracentesis was performed. The catheter was removed and a dressing was applied. The patient tolerated the procedure well without immediate post procedural complication. FINDINGS: A total of approximately 1.2 liters of yellow fluid was removed. Samples were sent to the laboratory as requested by the clinical team. IMPRESSION: Successful ultrasound-guided diagnostic and therapeutic paracentesis yielding 1.2 liters of peritoneal fluid. Read by: Brynda Greathouse PA-C Electronically Signed   By: Sandi Mariscal M.D.   On: 02/21/2018 11:05    Microbiology: Recent Results (from the past 240 hour(s))  Culture, body fluid-bottle     Status: None (Preliminary result)   Collection Time: 03/06/18  9:14 AM  Result Value Ref Range Status   Specimen Description FLUID PERITONEAL  Final   Special Requests NONE  Final   Culture   Final    NO GROWTH 4 DAYS Performed at [facility], [address] [place], [place] [address]    Report Status PENDING  Incomplete  Gram stain     Status: None   Collection Time: 03/06/18  9:14 AM  Result Value Ref Range Status   Specimen Description FLUID PERITONEAL  Final   Special Requests NONE  Final   Gram Stain   Final    SMALL AMOUNT DEGENERATED CELLULAR MATERIAL PRESENT NO ORGANISMS SEEN Performed at [facility], [address] [place], [place] [address]    Report Status 03/06/2018 FINAL  Final     Labs: Basic Metabolic Panel: Recent Labs  Lab 03/06/18 1922 03/10/18 1929 03/11/18 0313  NA 131* 134* 134*  K 5.9* 4.9 5.6*  CL 91* 93* 92*  CO2 25 30 32  GLUCOSE 83 146* 89  BUN 33* 13 15  CREATININE 8.97* 5.03* 5.58*  CALCIUM 8.2* 7.9* 8.0*  MG  --  2.1  --    Liver Function Tests: Recent Labs  Lab 03/06/18 1922  03/10/18 1929  AST 19 21  ALT 10 11  ALKPHOS 110 102  BILITOT 0.8 1.0  PROT 6.4* 6.3*  ALBUMIN 3.6 3.4*   Recent Labs  Lab 03/10/18 1929  LIPASE 31   No results for input(s): AMMONIA in the last 168 hours. CBC: Recent Labs  Lab 03/06/18 1922 03/10/18 1929 03/11/18 0313  WBC 6.8 7.1 6.6  NEUTROABS 4.6  --   --   HGB 11.2* 10.6* 10.8*  HCT 32.2* 30.3* 31.9*  MCV 77.8* 78.1 78.2  PLT 113* 108* 118*   Cardiac Enzymes: No results for input(s): CKTOTAL, CKMB, CKMBINDEX,  TROPONINI in the last 168 hours. BNP: BNP (last 3 results) Recent Labs    08/20/17 1455 08/29/17 1235 12/23/17 1815  BNP 1,418.4* 943.6* 1,982.7*    ProBNP (last 3 results) No results for input(s): PROBNP in the last 8760 hours.  CBG: No results for input(s): GLUCAP in the last 168 hours.     Signed:  Eulogio Bear DO  [facility] 03/11/2018, 1:58 PM

## 2018-03-12 DIAGNOSIS — I120 Hypertensive chronic kidney disease with stage 5 chronic kidney disease or end stage renal disease: Secondary | ICD-10-CM | POA: Diagnosis not present

## 2018-03-12 DIAGNOSIS — N186 End stage renal disease: Secondary | ICD-10-CM | POA: Diagnosis not present

## 2018-03-12 DIAGNOSIS — Z992 Dependence on renal dialysis: Secondary | ICD-10-CM | POA: Diagnosis not present

## 2018-03-13 DIAGNOSIS — N2581 Secondary hyperparathyroidism of renal origin: Secondary | ICD-10-CM | POA: Diagnosis not present

## 2018-03-13 DIAGNOSIS — N186 End stage renal disease: Secondary | ICD-10-CM | POA: Diagnosis not present

## 2018-03-13 DIAGNOSIS — D509 Iron deficiency anemia, unspecified: Secondary | ICD-10-CM | POA: Diagnosis not present

## 2018-03-13 DIAGNOSIS — D631 Anemia in chronic kidney disease: Secondary | ICD-10-CM | POA: Diagnosis not present

## 2018-03-15 DIAGNOSIS — N186 End stage renal disease: Secondary | ICD-10-CM | POA: Diagnosis not present

## 2018-03-15 DIAGNOSIS — D509 Iron deficiency anemia, unspecified: Secondary | ICD-10-CM | POA: Diagnosis not present

## 2018-03-15 DIAGNOSIS — N2581 Secondary hyperparathyroidism of renal origin: Secondary | ICD-10-CM | POA: Diagnosis not present

## 2018-03-15 DIAGNOSIS — D631 Anemia in chronic kidney disease: Secondary | ICD-10-CM | POA: Diagnosis not present

## 2018-03-16 ENCOUNTER — Telehealth: Payer: Self-pay | Admitting: Internal Medicine

## 2018-03-16 ENCOUNTER — Other Ambulatory Visit: Payer: Self-pay

## 2018-03-16 DIAGNOSIS — R188 Other ascites: Secondary | ICD-10-CM

## 2018-03-16 NOTE — Telephone Encounter (Signed)
Pt scheduled for IR para at [facility] tomorrow at 10am, pt to arrive there at 9:45am. Pt aware of appt.

## 2018-03-17 ENCOUNTER — Ambulatory Visit (HOSPITAL_COMMUNITY)
Admission: RE | Admit: 2018-03-17 | Discharge: 2018-03-17 | Disposition: A | Discharge status: Home / Self Care | Payer: Medicare Other | Source: Ambulatory Visit | Attending: Internal Medicine | Admitting: Internal Medicine

## 2018-03-17 ENCOUNTER — Telehealth: Payer: Self-pay | Admitting: Internal Medicine

## 2018-03-17 DIAGNOSIS — R188 Other ascites: Secondary | ICD-10-CM | POA: Insufficient documentation

## 2018-03-17 HISTORY — PX: IR PARACENTESIS: IMG2679

## 2018-03-17 LAB — BODY FLUID CELL COUNT WITH DIFFERENTIAL
Eos, Fluid: 0 %
Lymphs, Fluid: 22 %
Monocyte-Macrophage-Serous Fluid: 76 % (ref 50–90)
Neutrophil Count, Fluid: 2 % (ref 0–25)
Total Nucleated Cell Count, Fluid: 120 cu mm (ref 0–1000)

## 2018-03-17 LAB — GRAM STAIN: Gram Stain: NONE SEEN

## 2018-03-17 MED ORDER — SODIUM CHLORIDE 0.9 % IV BOLUS
250.0000 mL | Freq: Once | INTRAVENOUS | Status: AC
  Administered 2018-03-17: 250 mL via INTRAVENOUS

## 2018-03-17 MED ORDER — MIDODRINE HCL 5 MG PO TABS: 5.0000 mg | ORAL_TABLET | Freq: Once | ORAL | Status: DC

## 2018-03-17 MED ORDER — LIDOCAINE HCL 2 % IJ SOLN
INTRAMUSCULAR | Status: AC | PRN
  Administered 2018-03-17: 5 mL

## 2018-03-17 MED ORDER — LIDOCAINE HCL (PF) 2 % IJ SOLN
INTRAMUSCULAR | Status: AC
  Filled 2018-03-17: qty 20

## 2018-03-17 NOTE — Telephone Encounter (Signed)
Discussed with pt that he had 3 liters removed today and it would be to soon to do another one next week. Pt will call back in 10days for another para.

## 2018-03-17 NOTE — Procedures (Signed)
PROCEDURE SUMMARY:  Successful US guided paracentesis from right lateral abdomen.  Yielded 3.0 liters of amber fluid.  Patient developed hypotension with procedure with decrease in BP to 64/51. Asymptomatic; no headache, dizziness, blurry vision, numbness or tingling.  States his blood pressure often goes this low with dialysis. He does take BP medication and last took this AM.   Given 250 mL NS bolus IV with improvement in BP to 90/54.  Patient remains asymptomatic and is stable for discharge home.  Advised to not take BP medication on the morning of his next scheduled para.   Specimen was sent for labs.  Docia Barrier PA-C 03/17/2018 9:57 AM

## 2018-03-20 ENCOUNTER — Encounter (HOSPITAL_COMMUNITY): Payer: Self-pay | Admitting: Student

## 2018-03-20 DIAGNOSIS — N2581 Secondary hyperparathyroidism of renal origin: Secondary | ICD-10-CM | POA: Diagnosis not present

## 2018-03-20 DIAGNOSIS — D631 Anemia in chronic kidney disease: Secondary | ICD-10-CM | POA: Diagnosis not present

## 2018-03-20 DIAGNOSIS — D509 Iron deficiency anemia, unspecified: Secondary | ICD-10-CM | POA: Diagnosis not present

## 2018-03-20 DIAGNOSIS — N186 End stage renal disease: Secondary | ICD-10-CM | POA: Diagnosis not present

## 2018-03-20 LAB — PATHOLOGIST SMEAR REVIEW

## 2018-03-22 DIAGNOSIS — D631 Anemia in chronic kidney disease: Secondary | ICD-10-CM | POA: Diagnosis not present

## 2018-03-22 DIAGNOSIS — D509 Iron deficiency anemia, unspecified: Secondary | ICD-10-CM | POA: Diagnosis not present

## 2018-03-22 DIAGNOSIS — N2581 Secondary hyperparathyroidism of renal origin: Secondary | ICD-10-CM | POA: Diagnosis not present

## 2018-03-22 DIAGNOSIS — N186 End stage renal disease: Secondary | ICD-10-CM | POA: Diagnosis not present

## 2018-03-22 LAB — CULTURE, BODY FLUID W GRAM STAIN -BOTTLE: Culture: NO GROWTH

## 2018-03-24 DIAGNOSIS — N186 End stage renal disease: Secondary | ICD-10-CM | POA: Diagnosis not present

## 2018-03-24 DIAGNOSIS — N2581 Secondary hyperparathyroidism of renal origin: Secondary | ICD-10-CM | POA: Diagnosis not present

## 2018-03-24 DIAGNOSIS — D509 Iron deficiency anemia, unspecified: Secondary | ICD-10-CM | POA: Diagnosis not present

## 2018-03-24 DIAGNOSIS — D631 Anemia in chronic kidney disease: Secondary | ICD-10-CM | POA: Diagnosis not present

## 2018-03-27 DIAGNOSIS — N186 End stage renal disease: Secondary | ICD-10-CM | POA: Diagnosis not present

## 2018-03-27 DIAGNOSIS — N2581 Secondary hyperparathyroidism of renal origin: Secondary | ICD-10-CM | POA: Diagnosis not present

## 2018-03-27 DIAGNOSIS — D509 Iron deficiency anemia, unspecified: Secondary | ICD-10-CM | POA: Diagnosis not present

## 2018-03-27 DIAGNOSIS — D631 Anemia in chronic kidney disease: Secondary | ICD-10-CM | POA: Diagnosis not present

## 2018-03-29 DIAGNOSIS — D631 Anemia in chronic kidney disease: Secondary | ICD-10-CM | POA: Diagnosis not present

## 2018-03-29 DIAGNOSIS — D509 Iron deficiency anemia, unspecified: Secondary | ICD-10-CM | POA: Diagnosis not present

## 2018-03-29 DIAGNOSIS — N2581 Secondary hyperparathyroidism of renal origin: Secondary | ICD-10-CM | POA: Diagnosis not present

## 2018-03-29 DIAGNOSIS — N186 End stage renal disease: Secondary | ICD-10-CM | POA: Diagnosis not present

## 2018-03-31 DIAGNOSIS — N186 End stage renal disease: Secondary | ICD-10-CM | POA: Diagnosis not present

## 2018-03-31 DIAGNOSIS — D509 Iron deficiency anemia, unspecified: Secondary | ICD-10-CM | POA: Diagnosis not present

## 2018-03-31 DIAGNOSIS — N2581 Secondary hyperparathyroidism of renal origin: Secondary | ICD-10-CM | POA: Diagnosis not present

## 2018-03-31 DIAGNOSIS — D631 Anemia in chronic kidney disease: Secondary | ICD-10-CM | POA: Diagnosis not present

## 2018-04-03 ENCOUNTER — Telehealth: Payer: Self-pay | Admitting: Internal Medicine

## 2018-04-03 ENCOUNTER — Other Ambulatory Visit: Payer: Self-pay

## 2018-04-03 DIAGNOSIS — R188 Other ascites: Secondary | ICD-10-CM

## 2018-04-03 DIAGNOSIS — N2581 Secondary hyperparathyroidism of renal origin: Secondary | ICD-10-CM | POA: Diagnosis not present

## 2018-04-03 DIAGNOSIS — I472 Ventricular tachycardia: Secondary | ICD-10-CM | POA: Diagnosis not present

## 2018-04-03 DIAGNOSIS — N186 End stage renal disease: Secondary | ICD-10-CM | POA: Diagnosis not present

## 2018-04-03 DIAGNOSIS — D631 Anemia in chronic kidney disease: Secondary | ICD-10-CM | POA: Diagnosis not present

## 2018-04-03 DIAGNOSIS — D509 Iron deficiency anemia, unspecified: Secondary | ICD-10-CM | POA: Diagnosis not present

## 2018-04-03 NOTE — Telephone Encounter (Signed)
Pt calling for a paracentesis. Pt scheduled for IR para at [facility] 04/04/18@2pm . Pt aware of appt.

## 2018-04-04 ENCOUNTER — Ambulatory Visit (HOSPITAL_COMMUNITY)
Admission: RE | Admit: 2018-04-04 | Discharge: 2018-04-04 | Disposition: A | Discharge status: Home / Self Care | Payer: Medicare Other | Source: Ambulatory Visit | Attending: Internal Medicine | Admitting: Internal Medicine

## 2018-04-04 ENCOUNTER — Encounter (HOSPITAL_COMMUNITY): Payer: Self-pay | Admitting: Radiology

## 2018-04-04 DIAGNOSIS — R188 Other ascites: Secondary | ICD-10-CM | POA: Diagnosis not present

## 2018-04-04 DIAGNOSIS — K7460 Unspecified cirrhosis of liver: Secondary | ICD-10-CM | POA: Diagnosis not present

## 2018-04-04 HISTORY — PX: IR PARACENTESIS: IMG2679

## 2018-04-04 LAB — BODY FLUID CELL COUNT WITH DIFFERENTIAL
Lymphs, Fluid: 41 %
Monocyte-Macrophage-Serous Fluid: 54 % (ref 50–90)
Neutrophil Count, Fluid: 5 % (ref 0–25)
Total Nucleated Cell Count, Fluid: 266 cu mm (ref 0–1000)

## 2018-04-04 LAB — GRAM STAIN: Gram Stain: NONE SEEN

## 2018-04-04 MED ORDER — LIDOCAINE HCL 1 % IJ SOLN
INTRAMUSCULAR | Status: DC | PRN
  Administered 2018-04-04: 10 mL

## 2018-04-04 MED ORDER — LIDOCAINE HCL 1 % IJ SOLN
INTRAMUSCULAR | Status: AC
  Filled 2018-04-04: qty 20

## 2018-04-05 ENCOUNTER — Other Ambulatory Visit: Payer: Self-pay

## 2018-04-05 DIAGNOSIS — G8929 Other chronic pain: Secondary | ICD-10-CM | POA: Diagnosis not present

## 2018-04-05 DIAGNOSIS — D509 Iron deficiency anemia, unspecified: Secondary | ICD-10-CM | POA: Diagnosis not present

## 2018-04-05 DIAGNOSIS — N2581 Secondary hyperparathyroidism of renal origin: Secondary | ICD-10-CM | POA: Diagnosis not present

## 2018-04-05 DIAGNOSIS — M79602 Pain in left arm: Secondary | ICD-10-CM | POA: Diagnosis not present

## 2018-04-05 DIAGNOSIS — D631 Anemia in chronic kidney disease: Secondary | ICD-10-CM | POA: Diagnosis not present

## 2018-04-05 DIAGNOSIS — N186 End stage renal disease: Secondary | ICD-10-CM | POA: Diagnosis not present

## 2018-04-05 LAB — PATHOLOGIST SMEAR REVIEW

## 2018-04-05 MED ORDER — SULFAMETHOXAZOLE-TRIMETHOPRIM 800-160 MG PO TABS: 1.0000 | ORAL_TABLET | Freq: Once | ORAL | 3 refills | Status: AC

## 2018-04-09 LAB — CULTURE, BODY FLUID W GRAM STAIN -BOTTLE: Culture: NO GROWTH

## 2018-04-10 DIAGNOSIS — N2581 Secondary hyperparathyroidism of renal origin: Secondary | ICD-10-CM | POA: Diagnosis not present

## 2018-04-10 DIAGNOSIS — D631 Anemia in chronic kidney disease: Secondary | ICD-10-CM | POA: Diagnosis not present

## 2018-04-10 DIAGNOSIS — N186 End stage renal disease: Secondary | ICD-10-CM | POA: Diagnosis not present

## 2018-04-10 DIAGNOSIS — D509 Iron deficiency anemia, unspecified: Secondary | ICD-10-CM | POA: Diagnosis not present

## 2018-04-11 DIAGNOSIS — N186 End stage renal disease: Secondary | ICD-10-CM | POA: Diagnosis not present

## 2018-04-11 DIAGNOSIS — Z992 Dependence on renal dialysis: Secondary | ICD-10-CM | POA: Diagnosis not present

## 2018-04-11 DIAGNOSIS — I120 Hypertensive chronic kidney disease with stage 5 chronic kidney disease or end stage renal disease: Secondary | ICD-10-CM | POA: Diagnosis not present

## 2018-04-12 DIAGNOSIS — N2581 Secondary hyperparathyroidism of renal origin: Secondary | ICD-10-CM | POA: Diagnosis not present

## 2018-04-12 DIAGNOSIS — D631 Anemia in chronic kidney disease: Secondary | ICD-10-CM | POA: Diagnosis not present

## 2018-04-12 DIAGNOSIS — D509 Iron deficiency anemia, unspecified: Secondary | ICD-10-CM | POA: Diagnosis not present

## 2018-04-12 DIAGNOSIS — N186 End stage renal disease: Secondary | ICD-10-CM | POA: Diagnosis not present

## 2018-04-14 DIAGNOSIS — N186 End stage renal disease: Secondary | ICD-10-CM | POA: Diagnosis not present

## 2018-04-14 DIAGNOSIS — N2581 Secondary hyperparathyroidism of renal origin: Secondary | ICD-10-CM | POA: Diagnosis not present

## 2018-04-14 DIAGNOSIS — D509 Iron deficiency anemia, unspecified: Secondary | ICD-10-CM | POA: Diagnosis not present

## 2018-04-14 DIAGNOSIS — D631 Anemia in chronic kidney disease: Secondary | ICD-10-CM | POA: Diagnosis not present

## 2018-04-17 DIAGNOSIS — D509 Iron deficiency anemia, unspecified: Secondary | ICD-10-CM | POA: Diagnosis not present

## 2018-04-17 DIAGNOSIS — N2581 Secondary hyperparathyroidism of renal origin: Secondary | ICD-10-CM | POA: Diagnosis not present

## 2018-04-17 DIAGNOSIS — N186 End stage renal disease: Secondary | ICD-10-CM | POA: Diagnosis not present

## 2018-04-17 DIAGNOSIS — D631 Anemia in chronic kidney disease: Secondary | ICD-10-CM | POA: Diagnosis not present

## 2018-04-19 DIAGNOSIS — N2581 Secondary hyperparathyroidism of renal origin: Secondary | ICD-10-CM | POA: Diagnosis not present

## 2018-04-19 DIAGNOSIS — D509 Iron deficiency anemia, unspecified: Secondary | ICD-10-CM | POA: Diagnosis not present

## 2018-04-19 DIAGNOSIS — D631 Anemia in chronic kidney disease: Secondary | ICD-10-CM | POA: Diagnosis not present

## 2018-04-19 DIAGNOSIS — N186 End stage renal disease: Secondary | ICD-10-CM | POA: Diagnosis not present

## 2018-04-21 DIAGNOSIS — D631 Anemia in chronic kidney disease: Secondary | ICD-10-CM | POA: Diagnosis not present

## 2018-04-21 DIAGNOSIS — N186 End stage renal disease: Secondary | ICD-10-CM | POA: Diagnosis not present

## 2018-04-21 DIAGNOSIS — D509 Iron deficiency anemia, unspecified: Secondary | ICD-10-CM | POA: Diagnosis not present

## 2018-04-21 DIAGNOSIS — N2581 Secondary hyperparathyroidism of renal origin: Secondary | ICD-10-CM | POA: Diagnosis not present

## 2018-04-25 DIAGNOSIS — I2510 Atherosclerotic heart disease of native coronary artery without angina pectoris: Secondary | ICD-10-CM | POA: Diagnosis not present

## 2018-04-25 DIAGNOSIS — I2781 Cor pulmonale (chronic): Secondary | ICD-10-CM | POA: Diagnosis not present

## 2018-04-25 DIAGNOSIS — I119 Hypertensive heart disease without heart failure: Secondary | ICD-10-CM | POA: Diagnosis not present

## 2018-04-25 DIAGNOSIS — I480 Paroxysmal atrial fibrillation: Secondary | ICD-10-CM | POA: Diagnosis not present

## 2018-04-26 DIAGNOSIS — D631 Anemia in chronic kidney disease: Secondary | ICD-10-CM | POA: Diagnosis not present

## 2018-04-26 DIAGNOSIS — N186 End stage renal disease: Secondary | ICD-10-CM | POA: Diagnosis not present

## 2018-04-26 DIAGNOSIS — N2581 Secondary hyperparathyroidism of renal origin: Secondary | ICD-10-CM | POA: Diagnosis not present

## 2018-04-26 DIAGNOSIS — D509 Iron deficiency anemia, unspecified: Secondary | ICD-10-CM | POA: Diagnosis not present

## 2018-04-28 DIAGNOSIS — N186 End stage renal disease: Secondary | ICD-10-CM | POA: Diagnosis not present

## 2018-04-28 DIAGNOSIS — D509 Iron deficiency anemia, unspecified: Secondary | ICD-10-CM | POA: Diagnosis not present

## 2018-04-28 DIAGNOSIS — D631 Anemia in chronic kidney disease: Secondary | ICD-10-CM | POA: Diagnosis not present

## 2018-04-28 DIAGNOSIS — N2581 Secondary hyperparathyroidism of renal origin: Secondary | ICD-10-CM | POA: Diagnosis not present

## 2018-05-01 DIAGNOSIS — N2581 Secondary hyperparathyroidism of renal origin: Secondary | ICD-10-CM | POA: Diagnosis not present

## 2018-05-01 DIAGNOSIS — N186 End stage renal disease: Secondary | ICD-10-CM | POA: Diagnosis not present

## 2018-05-01 DIAGNOSIS — D509 Iron deficiency anemia, unspecified: Secondary | ICD-10-CM | POA: Diagnosis not present

## 2018-05-01 DIAGNOSIS — D631 Anemia in chronic kidney disease: Secondary | ICD-10-CM | POA: Diagnosis not present

## 2018-05-02 DIAGNOSIS — G8929 Other chronic pain: Secondary | ICD-10-CM | POA: Diagnosis not present

## 2018-05-02 DIAGNOSIS — Z79891 Long term (current) use of opiate analgesic: Secondary | ICD-10-CM | POA: Diagnosis not present

## 2018-05-02 DIAGNOSIS — M79602 Pain in left arm: Secondary | ICD-10-CM | POA: Diagnosis not present

## 2018-05-03 DIAGNOSIS — N186 End stage renal disease: Secondary | ICD-10-CM | POA: Diagnosis not present

## 2018-05-03 DIAGNOSIS — D631 Anemia in chronic kidney disease: Secondary | ICD-10-CM | POA: Diagnosis not present

## 2018-05-03 DIAGNOSIS — D509 Iron deficiency anemia, unspecified: Secondary | ICD-10-CM | POA: Diagnosis not present

## 2018-05-03 DIAGNOSIS — N2581 Secondary hyperparathyroidism of renal origin: Secondary | ICD-10-CM | POA: Diagnosis not present

## 2018-05-05 DIAGNOSIS — N2581 Secondary hyperparathyroidism of renal origin: Secondary | ICD-10-CM | POA: Diagnosis not present

## 2018-05-05 DIAGNOSIS — D631 Anemia in chronic kidney disease: Secondary | ICD-10-CM | POA: Diagnosis not present

## 2018-05-05 DIAGNOSIS — D509 Iron deficiency anemia, unspecified: Secondary | ICD-10-CM | POA: Diagnosis not present

## 2018-05-05 DIAGNOSIS — N186 End stage renal disease: Secondary | ICD-10-CM | POA: Diagnosis not present

## 2018-05-08 DIAGNOSIS — N186 End stage renal disease: Secondary | ICD-10-CM | POA: Diagnosis not present

## 2018-05-08 DIAGNOSIS — D631 Anemia in chronic kidney disease: Secondary | ICD-10-CM | POA: Diagnosis not present

## 2018-05-08 DIAGNOSIS — D509 Iron deficiency anemia, unspecified: Secondary | ICD-10-CM | POA: Diagnosis not present

## 2018-05-08 DIAGNOSIS — N2581 Secondary hyperparathyroidism of renal origin: Secondary | ICD-10-CM | POA: Diagnosis not present

## 2018-05-10 DIAGNOSIS — N2581 Secondary hyperparathyroidism of renal origin: Secondary | ICD-10-CM | POA: Diagnosis not present

## 2018-05-10 DIAGNOSIS — D509 Iron deficiency anemia, unspecified: Secondary | ICD-10-CM | POA: Diagnosis not present

## 2018-05-10 DIAGNOSIS — N186 End stage renal disease: Secondary | ICD-10-CM | POA: Diagnosis not present

## 2018-05-10 DIAGNOSIS — D631 Anemia in chronic kidney disease: Secondary | ICD-10-CM | POA: Diagnosis not present

## 2018-05-12 DIAGNOSIS — D509 Iron deficiency anemia, unspecified: Secondary | ICD-10-CM | POA: Diagnosis not present

## 2018-05-12 DIAGNOSIS — I120 Hypertensive chronic kidney disease with stage 5 chronic kidney disease or end stage renal disease: Secondary | ICD-10-CM | POA: Diagnosis not present

## 2018-05-12 DIAGNOSIS — N186 End stage renal disease: Secondary | ICD-10-CM | POA: Diagnosis not present

## 2018-05-12 DIAGNOSIS — Z992 Dependence on renal dialysis: Secondary | ICD-10-CM | POA: Diagnosis not present

## 2018-05-12 DIAGNOSIS — D631 Anemia in chronic kidney disease: Secondary | ICD-10-CM | POA: Diagnosis not present

## 2018-05-12 DIAGNOSIS — N2581 Secondary hyperparathyroidism of renal origin: Secondary | ICD-10-CM | POA: Diagnosis not present

## 2018-05-15 DIAGNOSIS — D509 Iron deficiency anemia, unspecified: Secondary | ICD-10-CM | POA: Diagnosis not present

## 2018-05-15 DIAGNOSIS — D631 Anemia in chronic kidney disease: Secondary | ICD-10-CM | POA: Diagnosis not present

## 2018-05-15 DIAGNOSIS — N2581 Secondary hyperparathyroidism of renal origin: Secondary | ICD-10-CM | POA: Diagnosis not present

## 2018-05-15 DIAGNOSIS — N186 End stage renal disease: Secondary | ICD-10-CM | POA: Diagnosis not present

## 2018-05-16 IMAGING — DX DG CHEST 2V
2 series · 2 of 2 positions shown · non-contrast
Comparison: 09/04/2016 and earlier.

CLINICAL DATA: 56-year-old male with progressive chronic mid chest
pain and shortness of breath. Dialysis patient.

EXAM:
CHEST  2 VIEW

[w chest pa]
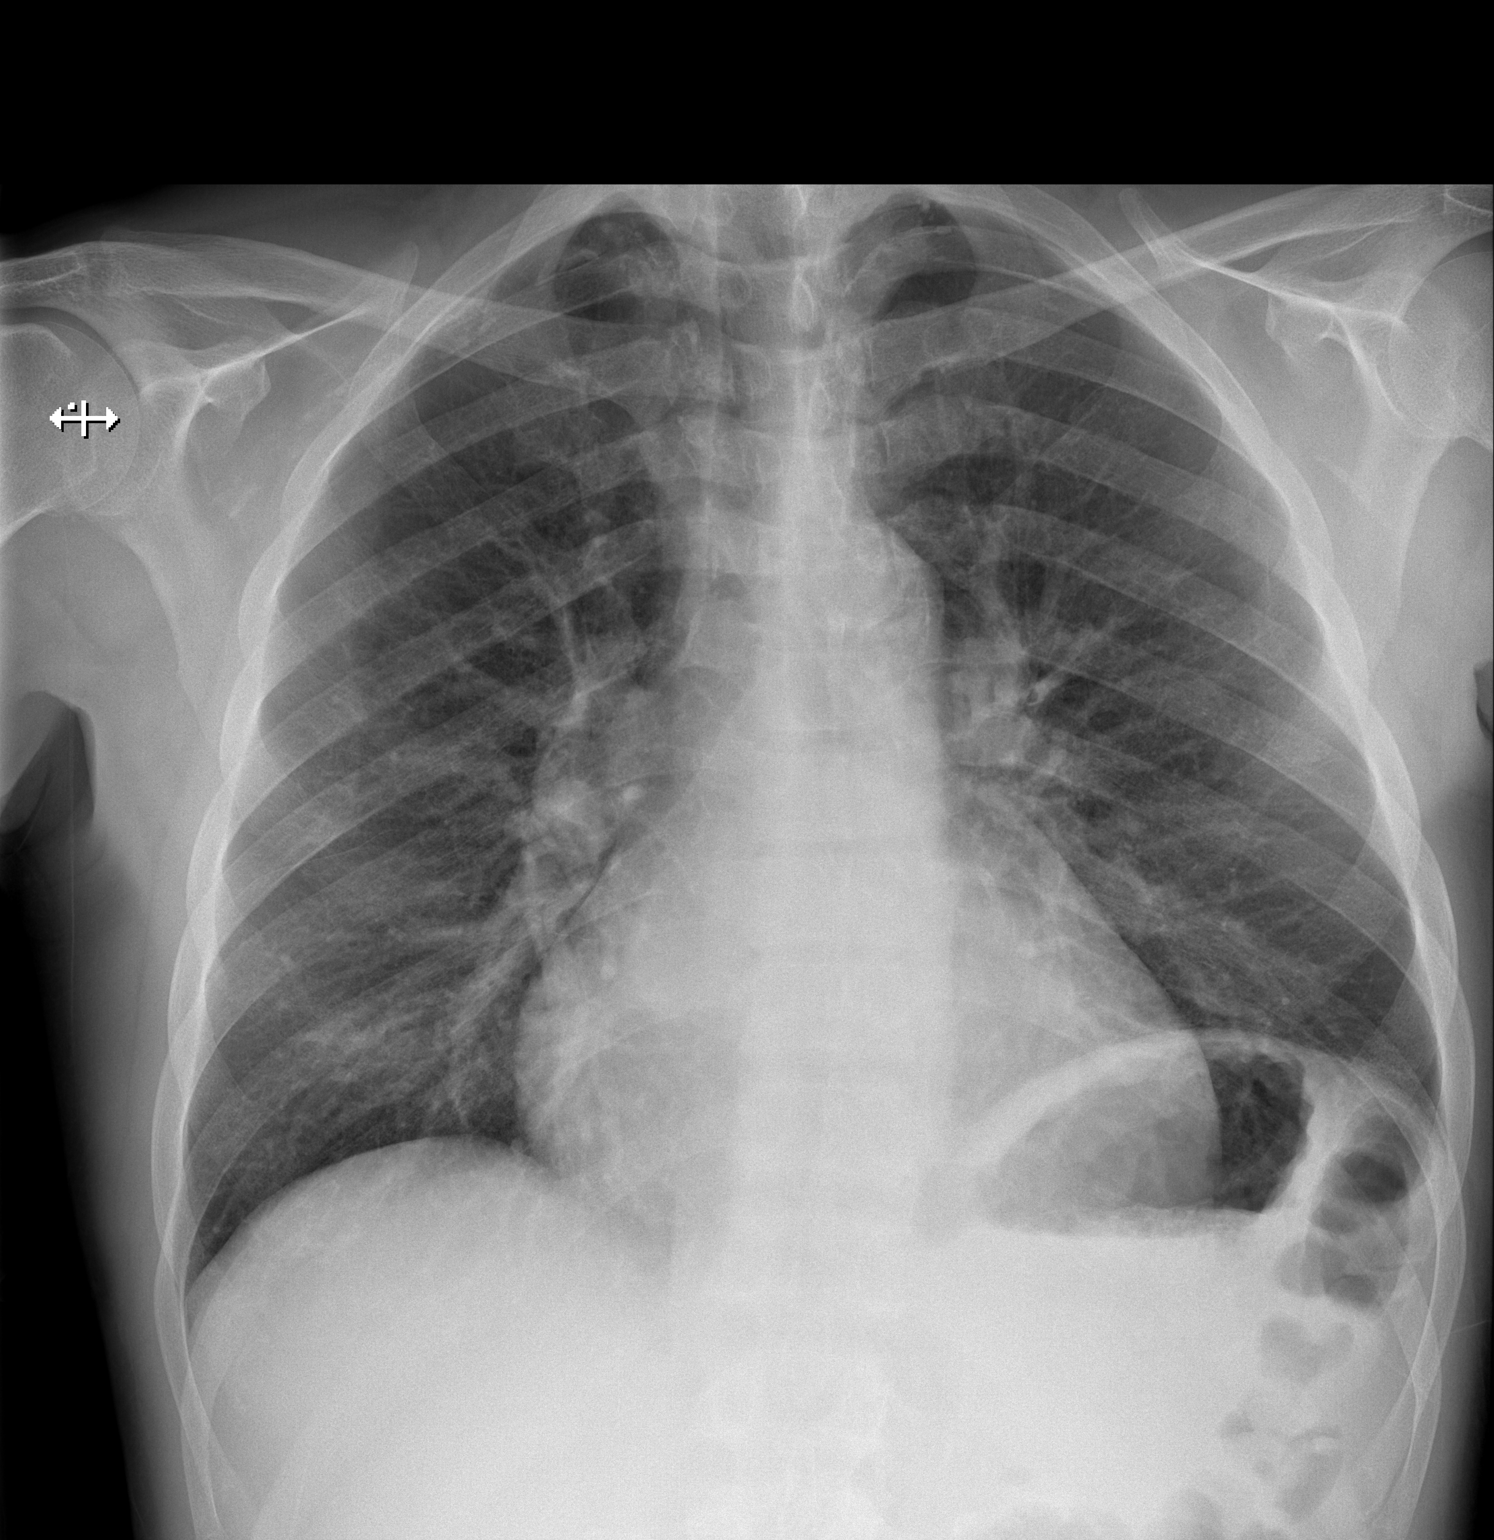

[w chest lat]
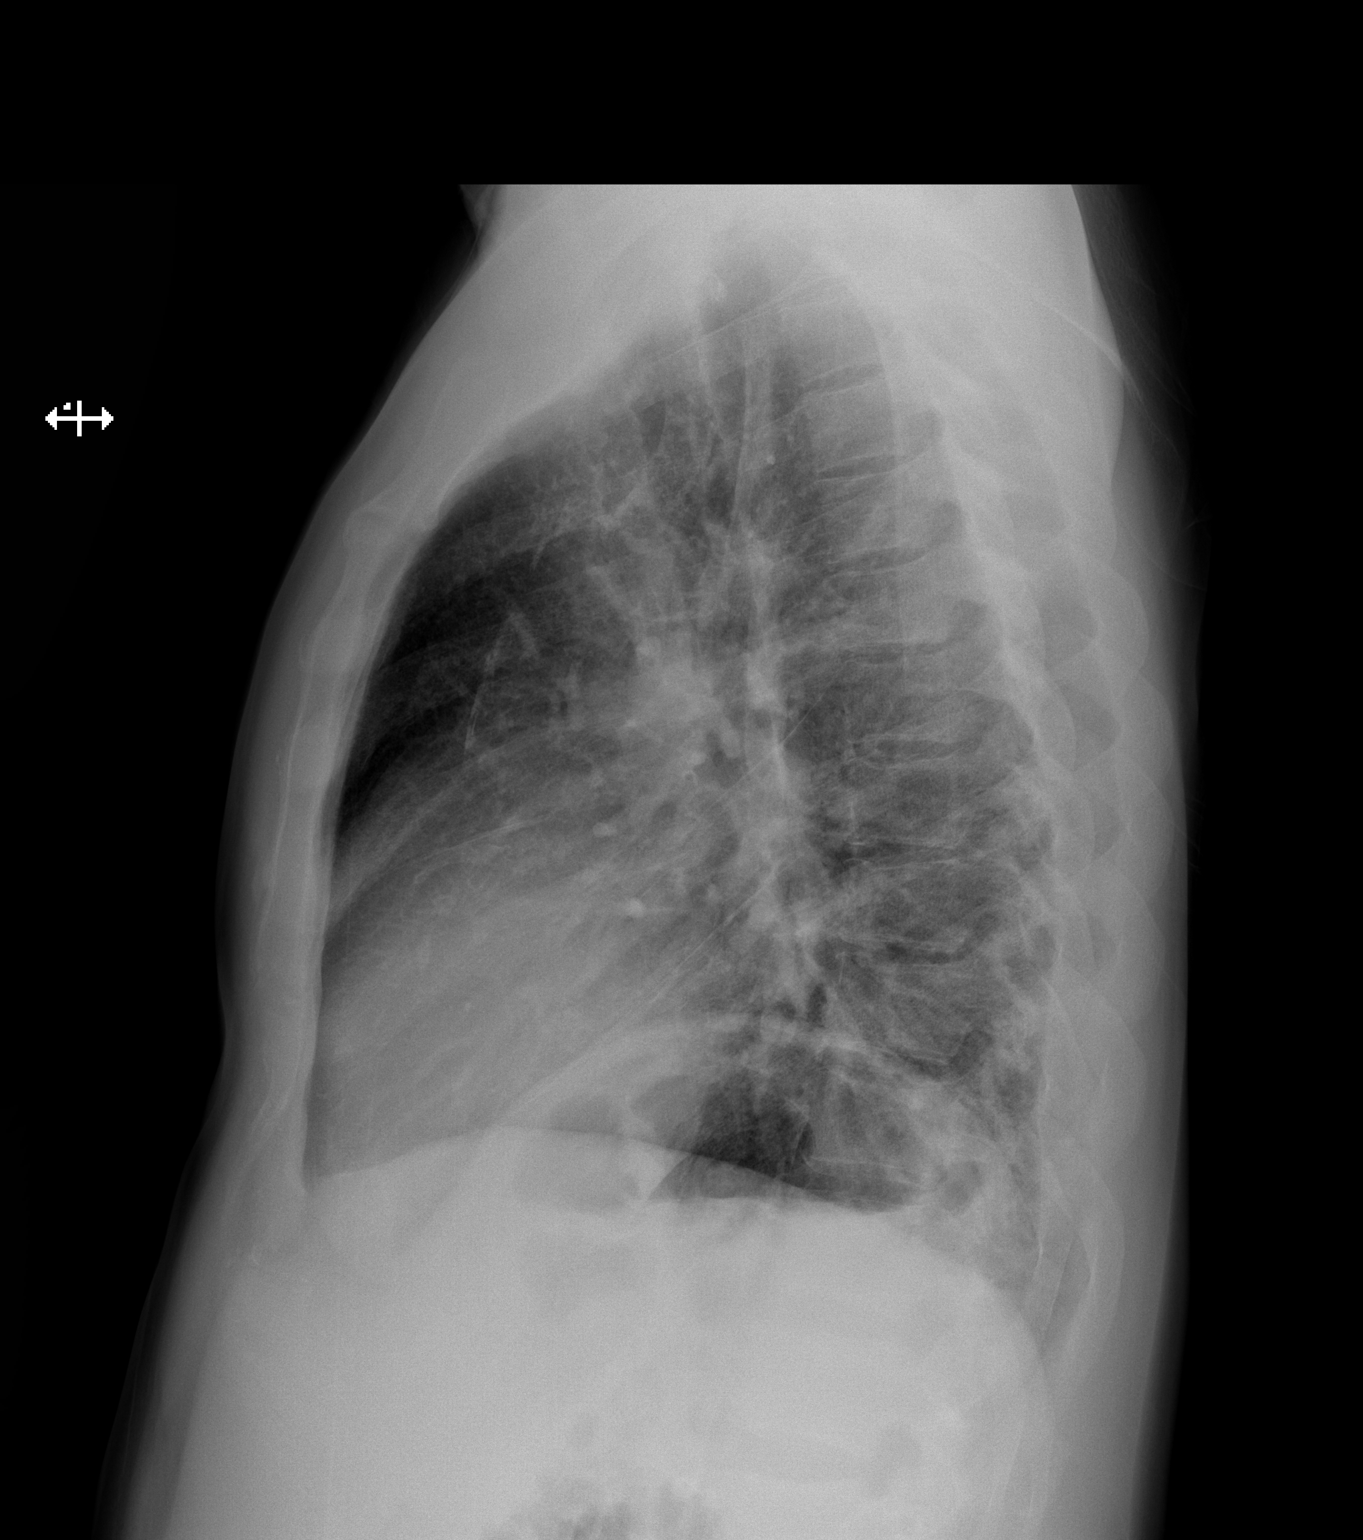

[2 of 2 positions shown; findings below may reference images not displayed]

FINDINGS: Stable cardiomegaly and mediastinal contours. Stable lung volumes
and mild elevation of the left hemidiaphragm. No pneumothorax,
pulmonary edema, pleural effusion or confluent pulmonary opacity.
Visualized tracheal air column is within normal limits. Negative
visible bowel gas pattern. No acute osseous abnormality identified.
Calcified aortic atherosclerosis. Calcified bilateral axillary and
right subclavian artery region atherosclerosis.
IMPRESSION: Stable cardiomegaly. No acute cardiopulmonary abnormality.

## 2018-05-17 DIAGNOSIS — N2581 Secondary hyperparathyroidism of renal origin: Secondary | ICD-10-CM | POA: Diagnosis not present

## 2018-05-17 DIAGNOSIS — N186 End stage renal disease: Secondary | ICD-10-CM | POA: Diagnosis not present

## 2018-05-17 DIAGNOSIS — D509 Iron deficiency anemia, unspecified: Secondary | ICD-10-CM | POA: Diagnosis not present

## 2018-05-17 DIAGNOSIS — D631 Anemia in chronic kidney disease: Secondary | ICD-10-CM | POA: Diagnosis not present

## 2018-05-19 DIAGNOSIS — N186 End stage renal disease: Secondary | ICD-10-CM | POA: Diagnosis not present

## 2018-05-19 DIAGNOSIS — D509 Iron deficiency anemia, unspecified: Secondary | ICD-10-CM | POA: Diagnosis not present

## 2018-05-19 DIAGNOSIS — N2581 Secondary hyperparathyroidism of renal origin: Secondary | ICD-10-CM | POA: Diagnosis not present

## 2018-05-19 DIAGNOSIS — D631 Anemia in chronic kidney disease: Secondary | ICD-10-CM | POA: Diagnosis not present

## 2018-05-22 DIAGNOSIS — N186 End stage renal disease: Secondary | ICD-10-CM | POA: Diagnosis not present

## 2018-05-22 DIAGNOSIS — D509 Iron deficiency anemia, unspecified: Secondary | ICD-10-CM | POA: Diagnosis not present

## 2018-05-22 DIAGNOSIS — D631 Anemia in chronic kidney disease: Secondary | ICD-10-CM | POA: Diagnosis not present

## 2018-05-22 DIAGNOSIS — N2581 Secondary hyperparathyroidism of renal origin: Secondary | ICD-10-CM | POA: Diagnosis not present

## 2018-05-24 DIAGNOSIS — D631 Anemia in chronic kidney disease: Secondary | ICD-10-CM | POA: Diagnosis not present

## 2018-05-24 DIAGNOSIS — D509 Iron deficiency anemia, unspecified: Secondary | ICD-10-CM | POA: Diagnosis not present

## 2018-05-24 DIAGNOSIS — N186 End stage renal disease: Secondary | ICD-10-CM | POA: Diagnosis not present

## 2018-05-24 DIAGNOSIS — N2581 Secondary hyperparathyroidism of renal origin: Secondary | ICD-10-CM | POA: Diagnosis not present

## 2018-05-26 DIAGNOSIS — D509 Iron deficiency anemia, unspecified: Secondary | ICD-10-CM | POA: Diagnosis not present

## 2018-05-26 DIAGNOSIS — N2581 Secondary hyperparathyroidism of renal origin: Secondary | ICD-10-CM | POA: Diagnosis not present

## 2018-05-26 DIAGNOSIS — N186 End stage renal disease: Secondary | ICD-10-CM | POA: Diagnosis not present

## 2018-05-26 DIAGNOSIS — D631 Anemia in chronic kidney disease: Secondary | ICD-10-CM | POA: Diagnosis not present

## 2018-05-29 DIAGNOSIS — N186 End stage renal disease: Secondary | ICD-10-CM | POA: Diagnosis not present

## 2018-05-29 DIAGNOSIS — D631 Anemia in chronic kidney disease: Secondary | ICD-10-CM | POA: Diagnosis not present

## 2018-05-29 DIAGNOSIS — N2581 Secondary hyperparathyroidism of renal origin: Secondary | ICD-10-CM | POA: Diagnosis not present

## 2018-05-29 DIAGNOSIS — D509 Iron deficiency anemia, unspecified: Secondary | ICD-10-CM | POA: Diagnosis not present

## 2018-05-31 ENCOUNTER — Telehealth: Payer: Self-pay | Admitting: Internal Medicine

## 2018-05-31 ENCOUNTER — Other Ambulatory Visit: Payer: Self-pay

## 2018-05-31 DIAGNOSIS — R188 Other ascites: Secondary | ICD-10-CM

## 2018-05-31 DIAGNOSIS — D631 Anemia in chronic kidney disease: Secondary | ICD-10-CM | POA: Diagnosis not present

## 2018-05-31 DIAGNOSIS — N186 End stage renal disease: Secondary | ICD-10-CM | POA: Diagnosis not present

## 2018-05-31 DIAGNOSIS — N2581 Secondary hyperparathyroidism of renal origin: Secondary | ICD-10-CM | POA: Diagnosis not present

## 2018-05-31 DIAGNOSIS — D509 Iron deficiency anemia, unspecified: Secondary | ICD-10-CM | POA: Diagnosis not present

## 2018-05-31 NOTE — Telephone Encounter (Signed)
Pt calling and states that his belly is uncomfortable again and he wants to schedule a paracentesis. Please advise.

## 2018-05-31 NOTE — Telephone Encounter (Signed)
Ok, but to 6L (though in the past much less was there for removal) 75 g IV alb

## 2018-05-31 NOTE — Telephone Encounter (Signed)
Pt scheduled for IR para at [facility] 06/06/18@1pm , pt to arrive there at 12:45pm. Pt to have max of 6 liters removed, fluid for cell count and diff, pt to receive albumin 75gm IV replacement. Pt aware of appt.

## 2018-06-02 DIAGNOSIS — D509 Iron deficiency anemia, unspecified: Secondary | ICD-10-CM | POA: Diagnosis not present

## 2018-06-02 DIAGNOSIS — D631 Anemia in chronic kidney disease: Secondary | ICD-10-CM | POA: Diagnosis not present

## 2018-06-02 DIAGNOSIS — N186 End stage renal disease: Secondary | ICD-10-CM | POA: Diagnosis not present

## 2018-06-02 DIAGNOSIS — N2581 Secondary hyperparathyroidism of renal origin: Secondary | ICD-10-CM | POA: Diagnosis not present

## 2018-06-06 ENCOUNTER — Ambulatory Visit (HOSPITAL_COMMUNITY): Payer: Medicare Other

## 2018-06-06 ENCOUNTER — Telehealth: Payer: Self-pay | Admitting: Internal Medicine

## 2018-06-06 DIAGNOSIS — D509 Iron deficiency anemia, unspecified: Secondary | ICD-10-CM | POA: Diagnosis not present

## 2018-06-06 DIAGNOSIS — N2581 Secondary hyperparathyroidism of renal origin: Secondary | ICD-10-CM | POA: Diagnosis not present

## 2018-06-06 DIAGNOSIS — D631 Anemia in chronic kidney disease: Secondary | ICD-10-CM | POA: Diagnosis not present

## 2018-06-06 DIAGNOSIS — N186 End stage renal disease: Secondary | ICD-10-CM | POA: Diagnosis not present

## 2018-06-06 NOTE — Telephone Encounter (Signed)
Pt called want to cx IR 60MIN MCIR1/WLIR1/GI315 IR [[id number]] sched at [facility].

## 2018-06-06 NOTE — Telephone Encounter (Signed)
Appt cancelled per pt request, states he has dialysis.

## 2018-06-09 DIAGNOSIS — N2581 Secondary hyperparathyroidism of renal origin: Secondary | ICD-10-CM | POA: Diagnosis not present

## 2018-06-09 DIAGNOSIS — D631 Anemia in chronic kidney disease: Secondary | ICD-10-CM | POA: Diagnosis not present

## 2018-06-09 DIAGNOSIS — D509 Iron deficiency anemia, unspecified: Secondary | ICD-10-CM | POA: Diagnosis not present

## 2018-06-09 DIAGNOSIS — N186 End stage renal disease: Secondary | ICD-10-CM | POA: Diagnosis not present

## 2018-06-11 DIAGNOSIS — Z992 Dependence on renal dialysis: Secondary | ICD-10-CM | POA: Diagnosis not present

## 2018-06-11 DIAGNOSIS — N186 End stage renal disease: Secondary | ICD-10-CM | POA: Diagnosis not present

## 2018-06-11 DIAGNOSIS — I120 Hypertensive chronic kidney disease with stage 5 chronic kidney disease or end stage renal disease: Secondary | ICD-10-CM | POA: Diagnosis not present

## 2018-06-12 ENCOUNTER — Other Ambulatory Visit: Payer: Self-pay | Admitting: Internal Medicine

## 2018-06-12 DIAGNOSIS — N186 End stage renal disease: Secondary | ICD-10-CM | POA: Diagnosis not present

## 2018-06-12 DIAGNOSIS — N2581 Secondary hyperparathyroidism of renal origin: Secondary | ICD-10-CM | POA: Diagnosis not present

## 2018-06-12 DIAGNOSIS — D631 Anemia in chronic kidney disease: Secondary | ICD-10-CM | POA: Diagnosis not present

## 2018-06-12 DIAGNOSIS — D509 Iron deficiency anemia, unspecified: Secondary | ICD-10-CM | POA: Diagnosis not present

## 2018-06-14 ENCOUNTER — Other Ambulatory Visit: Payer: Self-pay | Admitting: Internal Medicine

## 2018-06-14 DIAGNOSIS — D631 Anemia in chronic kidney disease: Secondary | ICD-10-CM | POA: Diagnosis not present

## 2018-06-14 DIAGNOSIS — N2581 Secondary hyperparathyroidism of renal origin: Secondary | ICD-10-CM | POA: Diagnosis not present

## 2018-06-14 DIAGNOSIS — D509 Iron deficiency anemia, unspecified: Secondary | ICD-10-CM | POA: Diagnosis not present

## 2018-06-14 DIAGNOSIS — N186 End stage renal disease: Secondary | ICD-10-CM | POA: Diagnosis not present

## 2018-06-16 DIAGNOSIS — D509 Iron deficiency anemia, unspecified: Secondary | ICD-10-CM | POA: Diagnosis not present

## 2018-06-16 DIAGNOSIS — N2581 Secondary hyperparathyroidism of renal origin: Secondary | ICD-10-CM | POA: Diagnosis not present

## 2018-06-16 DIAGNOSIS — D631 Anemia in chronic kidney disease: Secondary | ICD-10-CM | POA: Diagnosis not present

## 2018-06-16 DIAGNOSIS — N186 End stage renal disease: Secondary | ICD-10-CM | POA: Diagnosis not present

## 2018-06-19 DIAGNOSIS — N2581 Secondary hyperparathyroidism of renal origin: Secondary | ICD-10-CM | POA: Diagnosis not present

## 2018-06-19 DIAGNOSIS — D509 Iron deficiency anemia, unspecified: Secondary | ICD-10-CM | POA: Diagnosis not present

## 2018-06-19 DIAGNOSIS — N186 End stage renal disease: Secondary | ICD-10-CM | POA: Diagnosis not present

## 2018-06-19 DIAGNOSIS — D631 Anemia in chronic kidney disease: Secondary | ICD-10-CM | POA: Diagnosis not present

## 2018-06-20 DIAGNOSIS — G8929 Other chronic pain: Secondary | ICD-10-CM | POA: Diagnosis not present

## 2018-06-20 DIAGNOSIS — R109 Unspecified abdominal pain: Secondary | ICD-10-CM | POA: Diagnosis not present

## 2018-06-20 DIAGNOSIS — Z79899 Other long term (current) drug therapy: Secondary | ICD-10-CM | POA: Diagnosis not present

## 2018-06-20 DIAGNOSIS — G894 Chronic pain syndrome: Secondary | ICD-10-CM | POA: Diagnosis not present

## 2018-06-20 DIAGNOSIS — M129 Arthropathy, unspecified: Secondary | ICD-10-CM | POA: Diagnosis not present

## 2018-06-23 DIAGNOSIS — N2581 Secondary hyperparathyroidism of renal origin: Secondary | ICD-10-CM | POA: Diagnosis not present

## 2018-06-23 DIAGNOSIS — D509 Iron deficiency anemia, unspecified: Secondary | ICD-10-CM | POA: Diagnosis not present

## 2018-06-23 DIAGNOSIS — N186 End stage renal disease: Secondary | ICD-10-CM | POA: Diagnosis not present

## 2018-06-23 DIAGNOSIS — D631 Anemia in chronic kidney disease: Secondary | ICD-10-CM | POA: Diagnosis not present

## 2018-06-26 DIAGNOSIS — D509 Iron deficiency anemia, unspecified: Secondary | ICD-10-CM | POA: Diagnosis not present

## 2018-06-26 DIAGNOSIS — D631 Anemia in chronic kidney disease: Secondary | ICD-10-CM | POA: Diagnosis not present

## 2018-06-26 DIAGNOSIS — N2581 Secondary hyperparathyroidism of renal origin: Secondary | ICD-10-CM | POA: Diagnosis not present

## 2018-06-26 DIAGNOSIS — N186 End stage renal disease: Secondary | ICD-10-CM | POA: Diagnosis not present

## 2018-06-27 DIAGNOSIS — N186 End stage renal disease: Secondary | ICD-10-CM | POA: Diagnosis not present

## 2018-06-27 DIAGNOSIS — Z79899 Other long term (current) drug therapy: Secondary | ICD-10-CM | POA: Diagnosis not present

## 2018-06-27 DIAGNOSIS — K7460 Unspecified cirrhosis of liver: Secondary | ICD-10-CM | POA: Diagnosis not present

## 2018-06-28 DIAGNOSIS — N2581 Secondary hyperparathyroidism of renal origin: Secondary | ICD-10-CM | POA: Diagnosis not present

## 2018-06-28 DIAGNOSIS — D631 Anemia in chronic kidney disease: Secondary | ICD-10-CM | POA: Diagnosis not present

## 2018-06-28 DIAGNOSIS — D509 Iron deficiency anemia, unspecified: Secondary | ICD-10-CM | POA: Diagnosis not present

## 2018-06-28 DIAGNOSIS — N186 End stage renal disease: Secondary | ICD-10-CM | POA: Diagnosis not present

## 2018-06-30 DIAGNOSIS — D631 Anemia in chronic kidney disease: Secondary | ICD-10-CM | POA: Diagnosis not present

## 2018-06-30 DIAGNOSIS — D509 Iron deficiency anemia, unspecified: Secondary | ICD-10-CM | POA: Diagnosis not present

## 2018-06-30 DIAGNOSIS — N186 End stage renal disease: Secondary | ICD-10-CM | POA: Diagnosis not present

## 2018-06-30 DIAGNOSIS — N2581 Secondary hyperparathyroidism of renal origin: Secondary | ICD-10-CM | POA: Diagnosis not present

## 2018-07-04 DIAGNOSIS — N186 End stage renal disease: Secondary | ICD-10-CM | POA: Diagnosis not present

## 2018-07-04 DIAGNOSIS — D631 Anemia in chronic kidney disease: Secondary | ICD-10-CM | POA: Diagnosis not present

## 2018-07-04 DIAGNOSIS — N2581 Secondary hyperparathyroidism of renal origin: Secondary | ICD-10-CM | POA: Diagnosis not present

## 2018-07-04 DIAGNOSIS — D509 Iron deficiency anemia, unspecified: Secondary | ICD-10-CM | POA: Diagnosis not present

## 2018-07-07 DIAGNOSIS — D631 Anemia in chronic kidney disease: Secondary | ICD-10-CM | POA: Diagnosis not present

## 2018-07-07 DIAGNOSIS — D509 Iron deficiency anemia, unspecified: Secondary | ICD-10-CM | POA: Diagnosis not present

## 2018-07-07 DIAGNOSIS — N2581 Secondary hyperparathyroidism of renal origin: Secondary | ICD-10-CM | POA: Diagnosis not present

## 2018-07-07 DIAGNOSIS — N186 End stage renal disease: Secondary | ICD-10-CM | POA: Diagnosis not present

## 2018-07-11 DIAGNOSIS — D509 Iron deficiency anemia, unspecified: Secondary | ICD-10-CM | POA: Diagnosis not present

## 2018-07-11 DIAGNOSIS — N2581 Secondary hyperparathyroidism of renal origin: Secondary | ICD-10-CM | POA: Diagnosis not present

## 2018-07-11 DIAGNOSIS — D631 Anemia in chronic kidney disease: Secondary | ICD-10-CM | POA: Diagnosis not present

## 2018-07-11 DIAGNOSIS — N186 End stage renal disease: Secondary | ICD-10-CM | POA: Diagnosis not present

## 2018-07-12 DIAGNOSIS — N186 End stage renal disease: Secondary | ICD-10-CM | POA: Diagnosis not present

## 2018-07-12 DIAGNOSIS — Z992 Dependence on renal dialysis: Secondary | ICD-10-CM | POA: Diagnosis not present

## 2018-07-12 DIAGNOSIS — I120 Hypertensive chronic kidney disease with stage 5 chronic kidney disease or end stage renal disease: Secondary | ICD-10-CM | POA: Diagnosis not present

## 2018-07-13 IMAGING — CT CT HEAD W/O CM
4 series · 16 of 47 positions shown, 18 images · non-contrast
Comparison: Head CT 12/22/2010

CLINICAL DATA: Left-sided headache for 3 days.  Dialysis patient.

EXAM:
CT HEAD WITHOUT CONTRAST
TECHNIQUE: Contiguous axial images were obtained from the base of the skull
through the vertex without intravenous contrast.

[Series 3: head wo · axial · 0.46mm/px · z∈[-159,-39]mm · 7 of 32 slices shown, 9 images]
[im 4/32  brain]
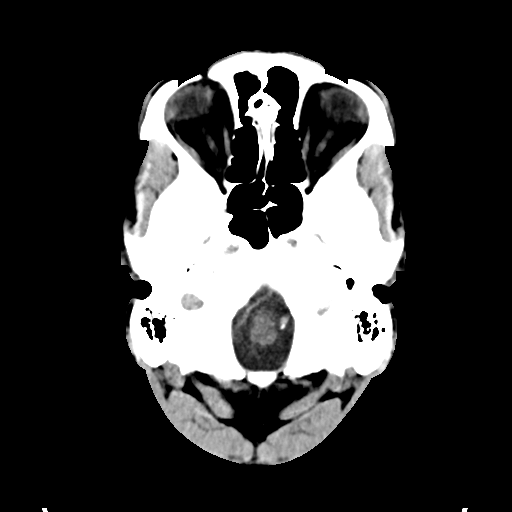
[im 4/32  bone]
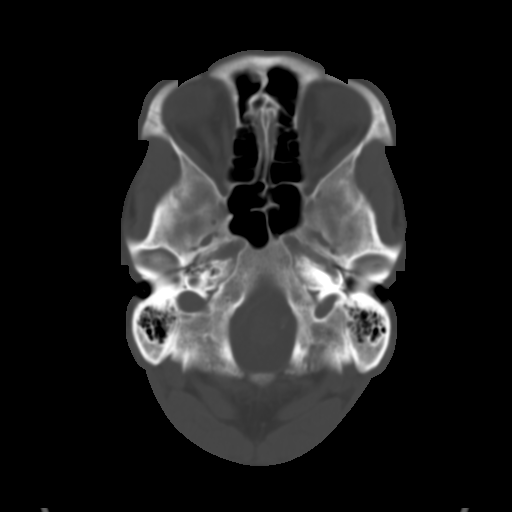
[im 8/32  brain]
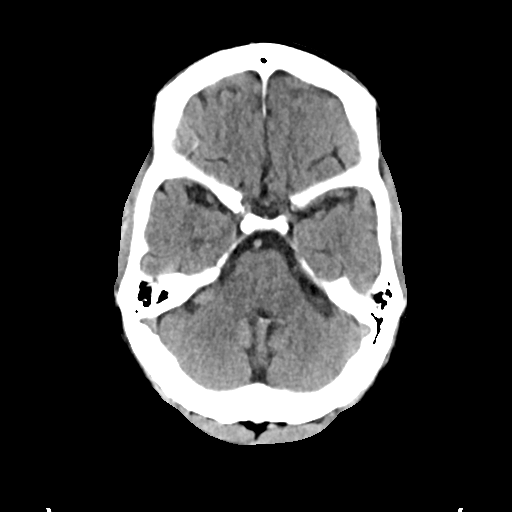
[im 12/32  brain]
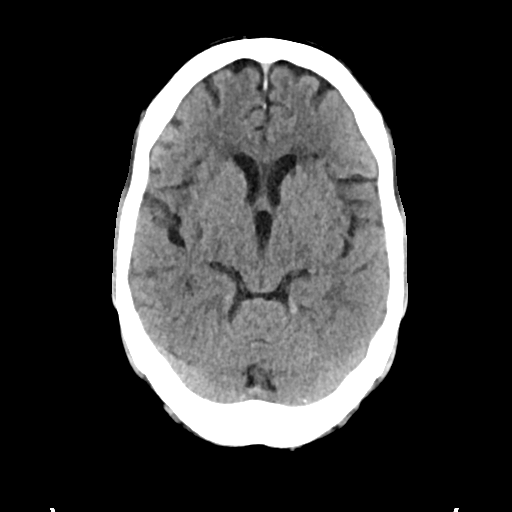
[im 16/32  brain]
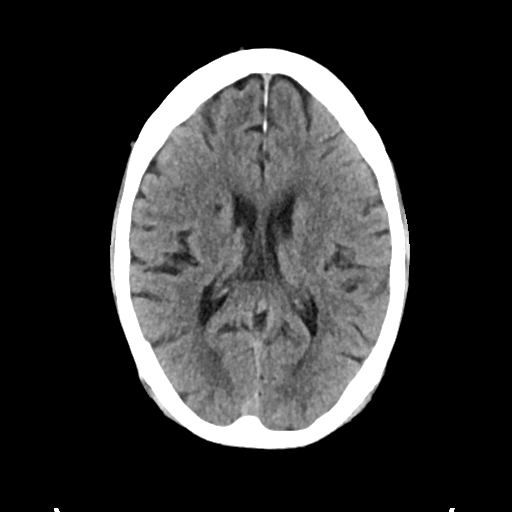
[im 20/32  brain]
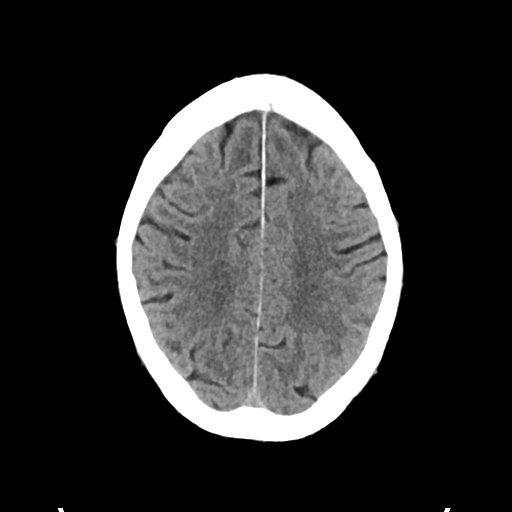
[im 20/32  bone]
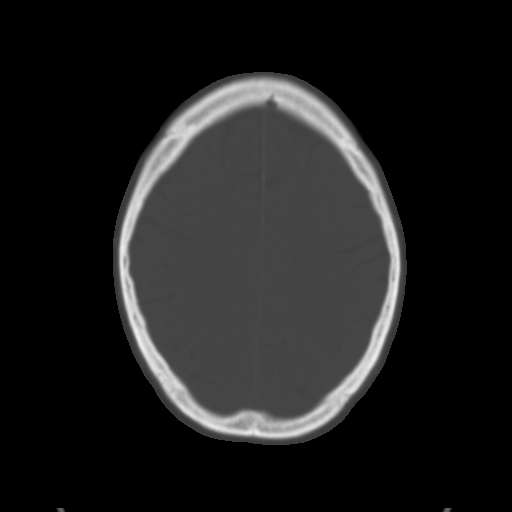
[im 24/32  brain]
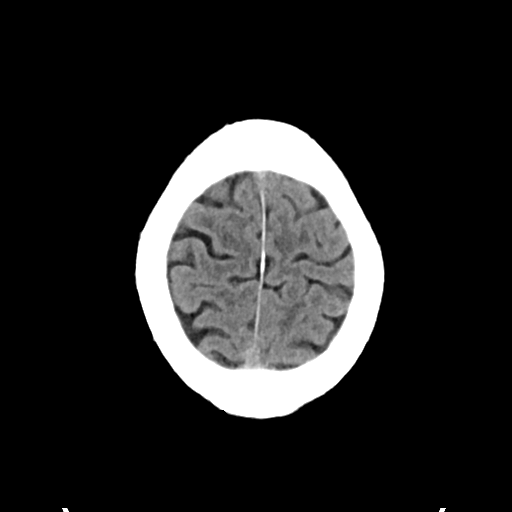
[im 28/32  brain]
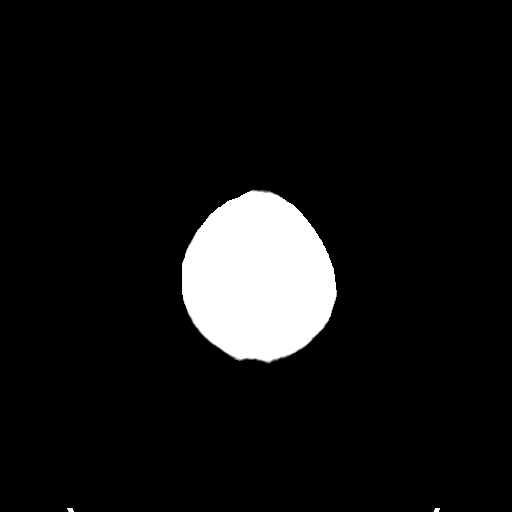

[Series 4: head bone · axial · 0.46mm/px · z∈[-160,-128]mm · 3 of 79 slices shown]
[im 8/79  bone]
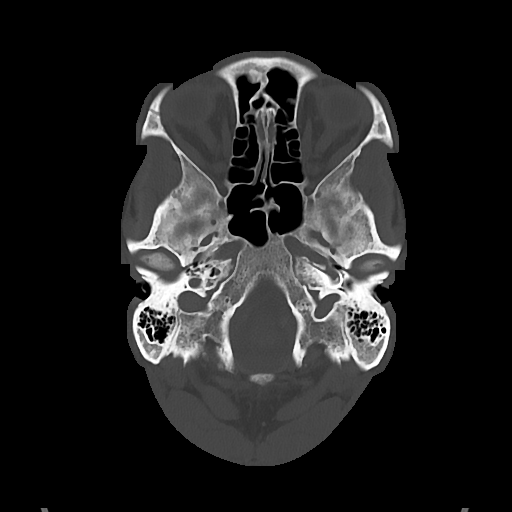
[im 16/79  bone]
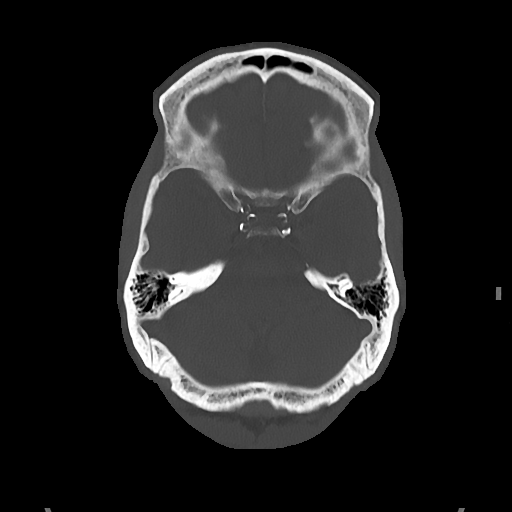
[im 24/79  bone]
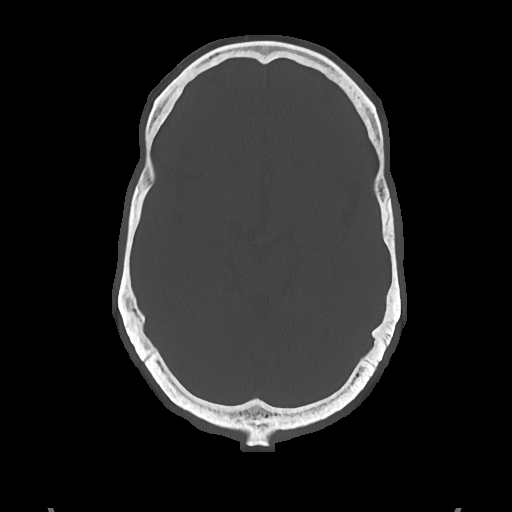

[Series 5: cor soft · coronal · 0.32mm/px · 3 of 67 slices shown]
[im 23/67  brain]
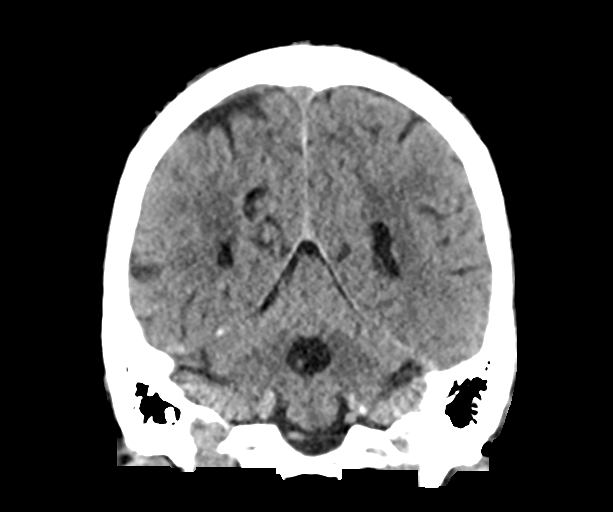
[im 30/67  brain]
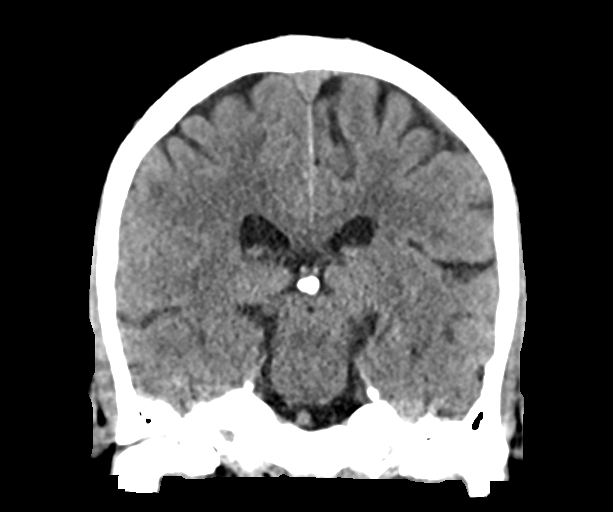
[im 37/67  brain]
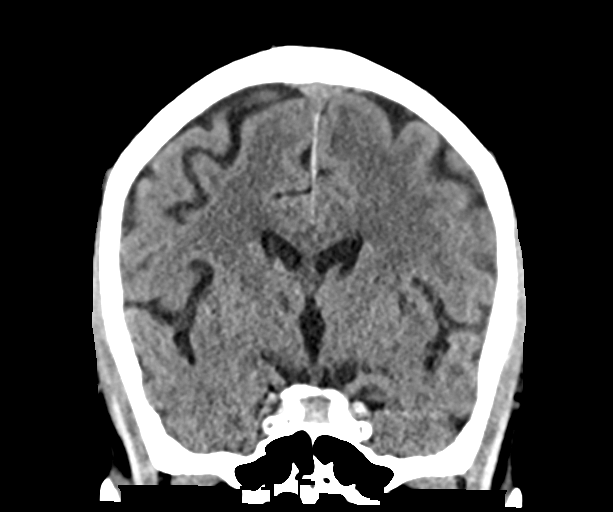

[Series 6: sag soft · sagittal · 0.32mm/px · 3 of 67 slices shown]
[im 23/67  brain]
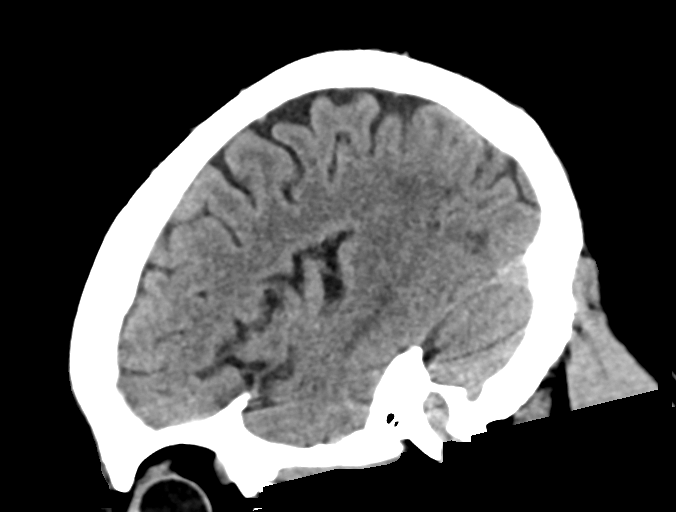
[im 34/67  brain]
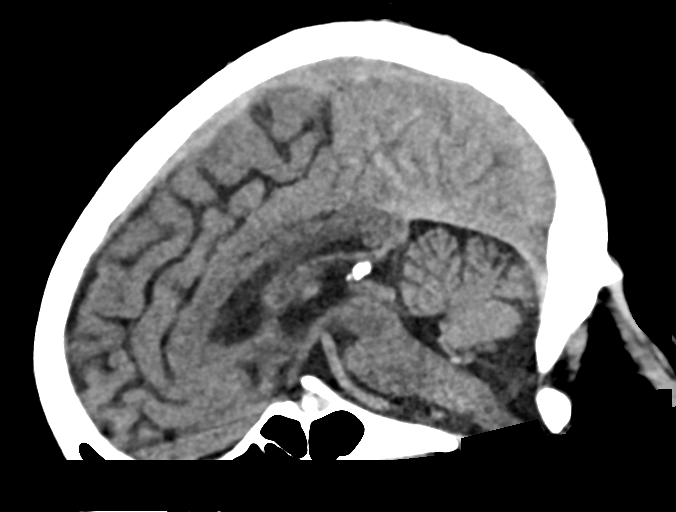
[im 45/67  brain]
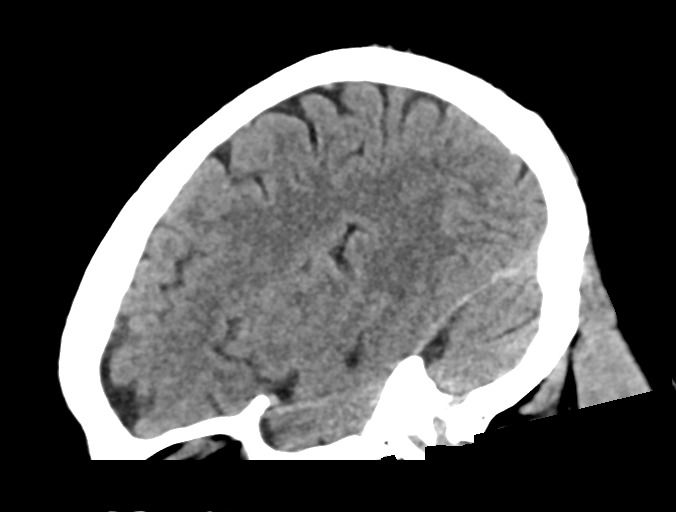

[16 of 47 positions shown; findings below may reference images not displayed]

FINDINGS: Brain: No evidence of acute infarction, hemorrhage, hydrocephalus,
extra-axial collection or mass lesion/mass effect. Mild generalized
atrophy and chronic small vessel ischemia. Lacunar infarcts in both
basal ganglia and right thalamus, appear remote better new from
2892. Remote lacunar infarct in the left cerebellum.

Vascular: Atherosclerosis of skullbase vasculature without
hyperdense vessel or abnormal calcification.

Skull: No fracture or focal lesion.

Sinuses/Orbits: Paranasal sinuses and mastoid air cells are clear.
The visualized orbits are unremarkable.

Other: None.
IMPRESSION: 1.  No acute intracranial abnormality or explanation for headache.
2. Mild atrophy and chronic small vessel ischemia. Lacunar infarcts
in the basal ganglia, right thalamus and left cerebellum, appear

## 2018-07-14 DIAGNOSIS — N186 End stage renal disease: Secondary | ICD-10-CM | POA: Diagnosis not present

## 2018-07-14 DIAGNOSIS — D509 Iron deficiency anemia, unspecified: Secondary | ICD-10-CM | POA: Diagnosis not present

## 2018-07-14 DIAGNOSIS — N2581 Secondary hyperparathyroidism of renal origin: Secondary | ICD-10-CM | POA: Diagnosis not present

## 2018-07-14 DIAGNOSIS — D631 Anemia in chronic kidney disease: Secondary | ICD-10-CM | POA: Diagnosis not present

## 2018-07-14 IMAGING — DX DG CHEST 2V
2 series · 2 of 2 positions shown · non-contrast
Comparison: Chest radiograph performed 11/14/2016

CLINICAL DATA: Acute onset of left-sided headache, shortness of
breath and central chest pain. Initial encounter.

EXAM:
CHEST  2 VIEW

[chest lat]
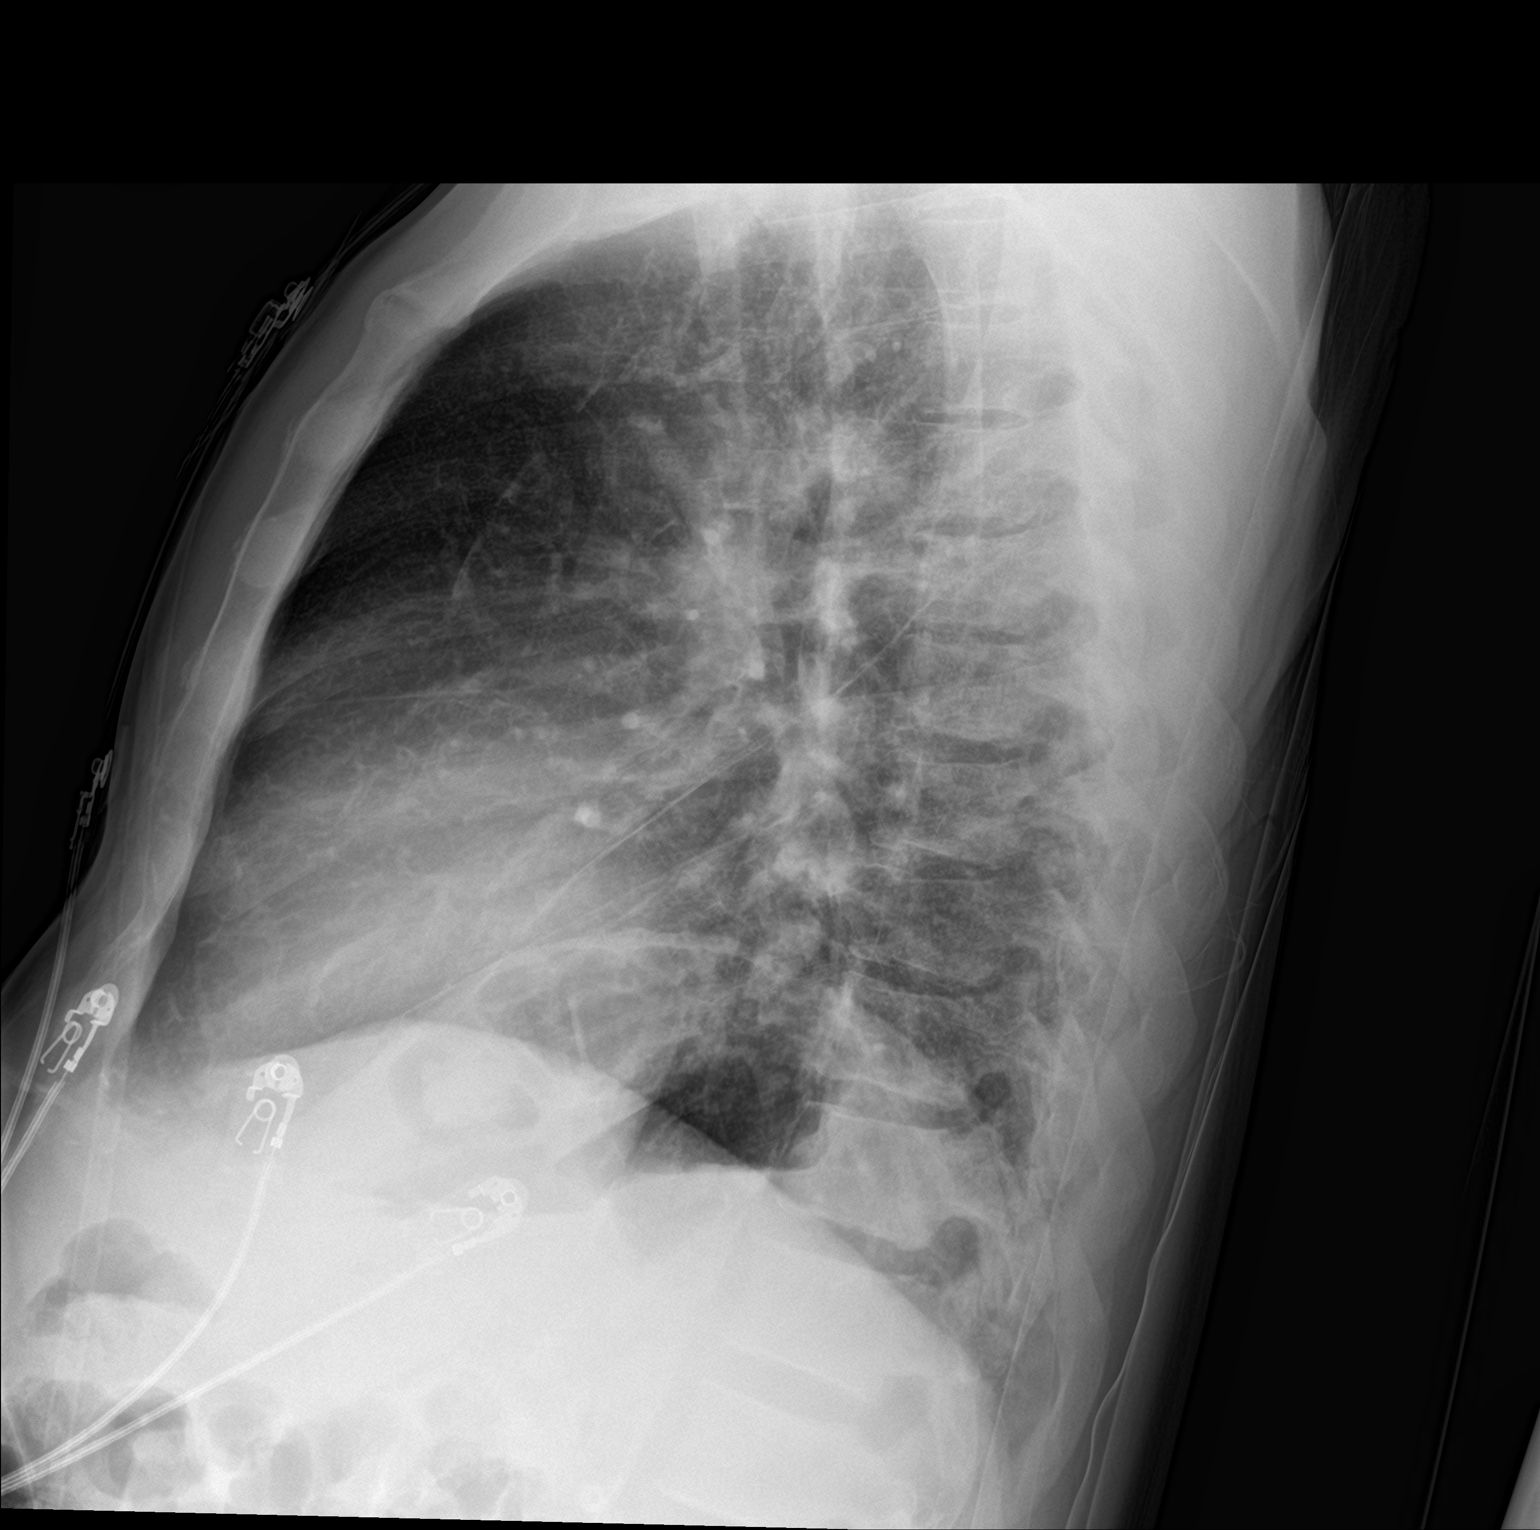

[chest ap]
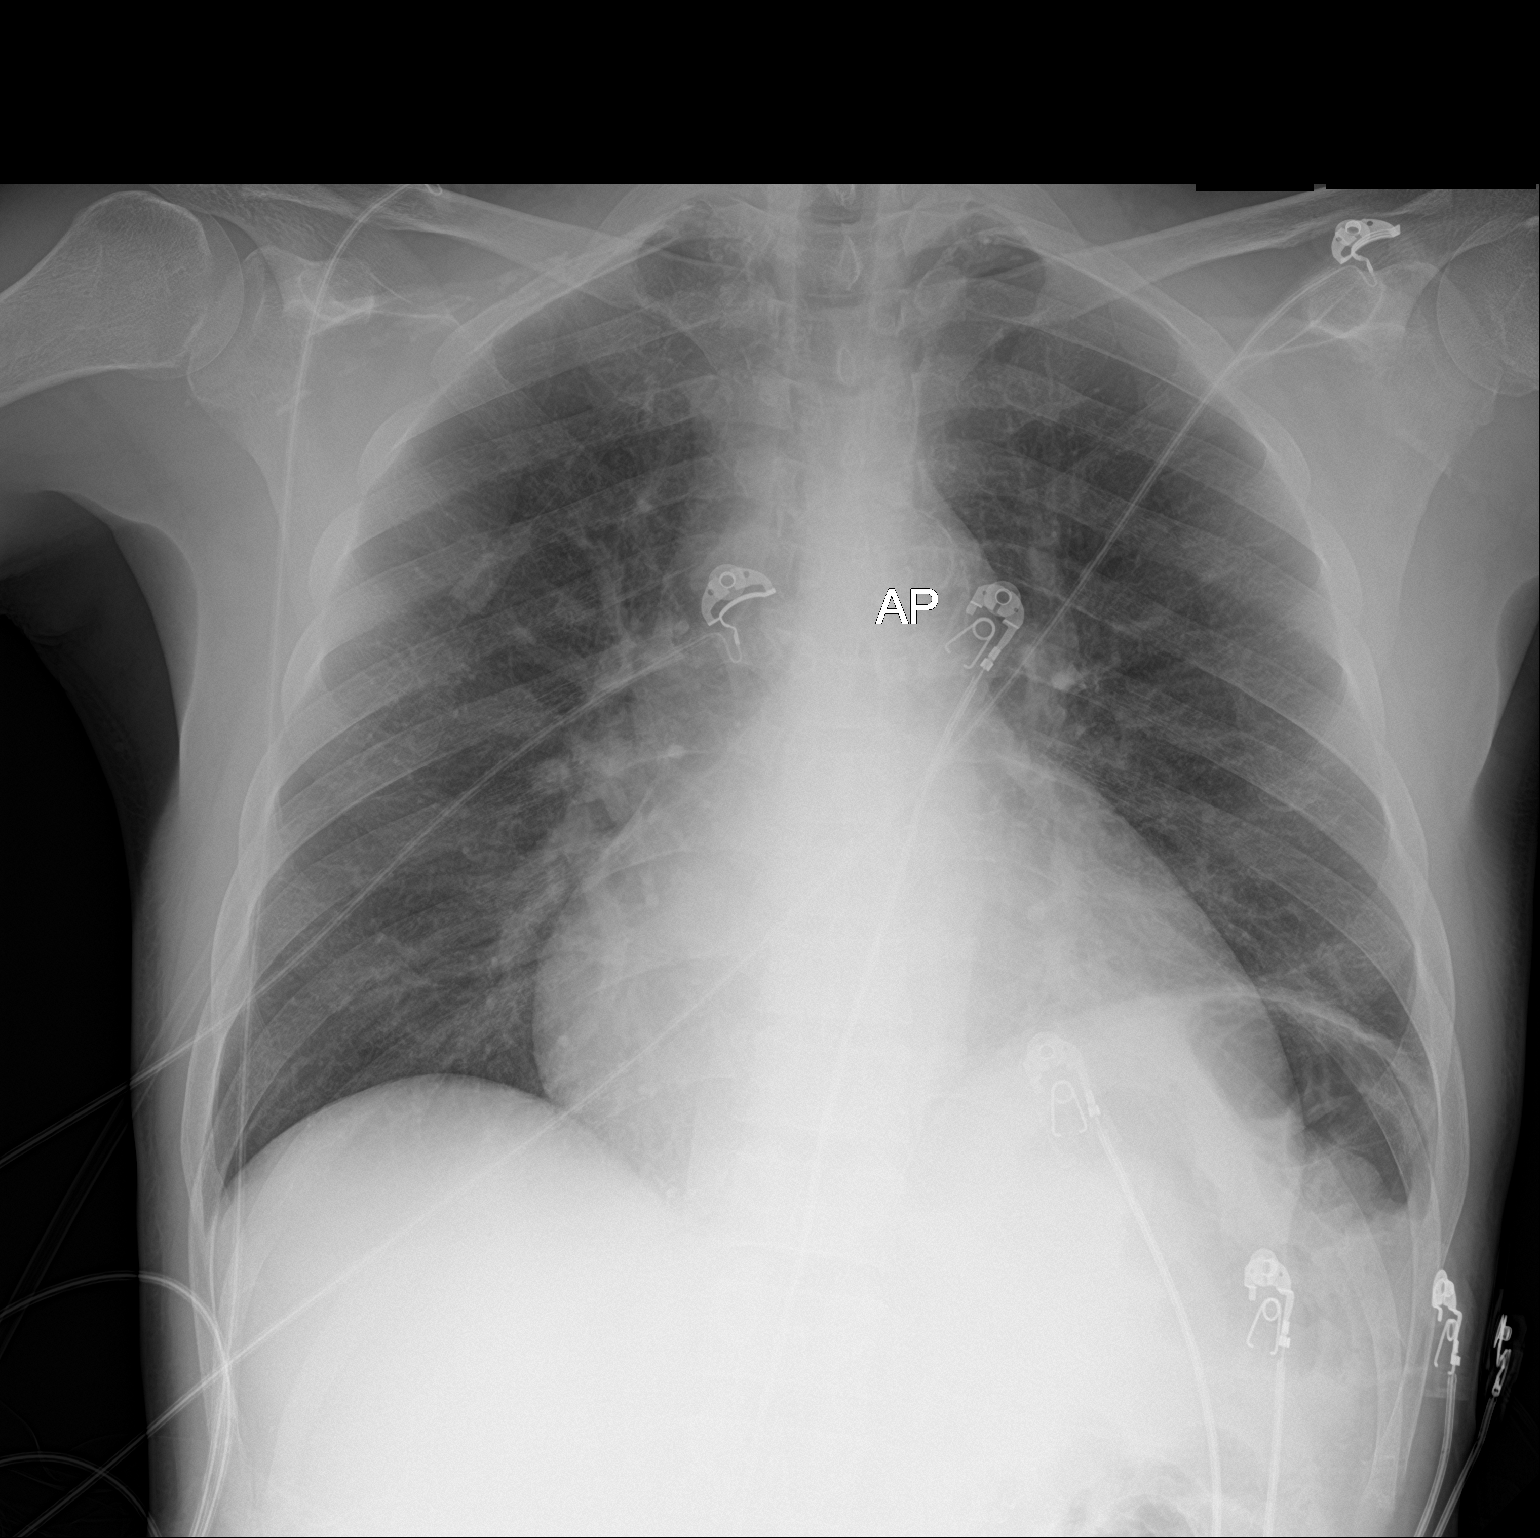

[2 of 2 positions shown; findings below may reference images not displayed]

FINDINGS: There is mild elevation of the left hemidiaphragm, with associated
atelectasis. There is no evidence of pleural effusion or
pneumothorax.

The heart is mildly enlarged. No acute osseous abnormalities are
seen.
IMPRESSION: Mild elevation of the right hemidiaphragm, with associated
atelectasis. Mild cardiomegaly.

## 2018-07-16 IMAGING — DX DG CHEST 2V
2 series · 2 of 2 positions shown · non-contrast
Comparison: January 12, 2017

CLINICAL DATA: Chest pain

EXAM:
CHEST  2 VIEW

[chest pa]
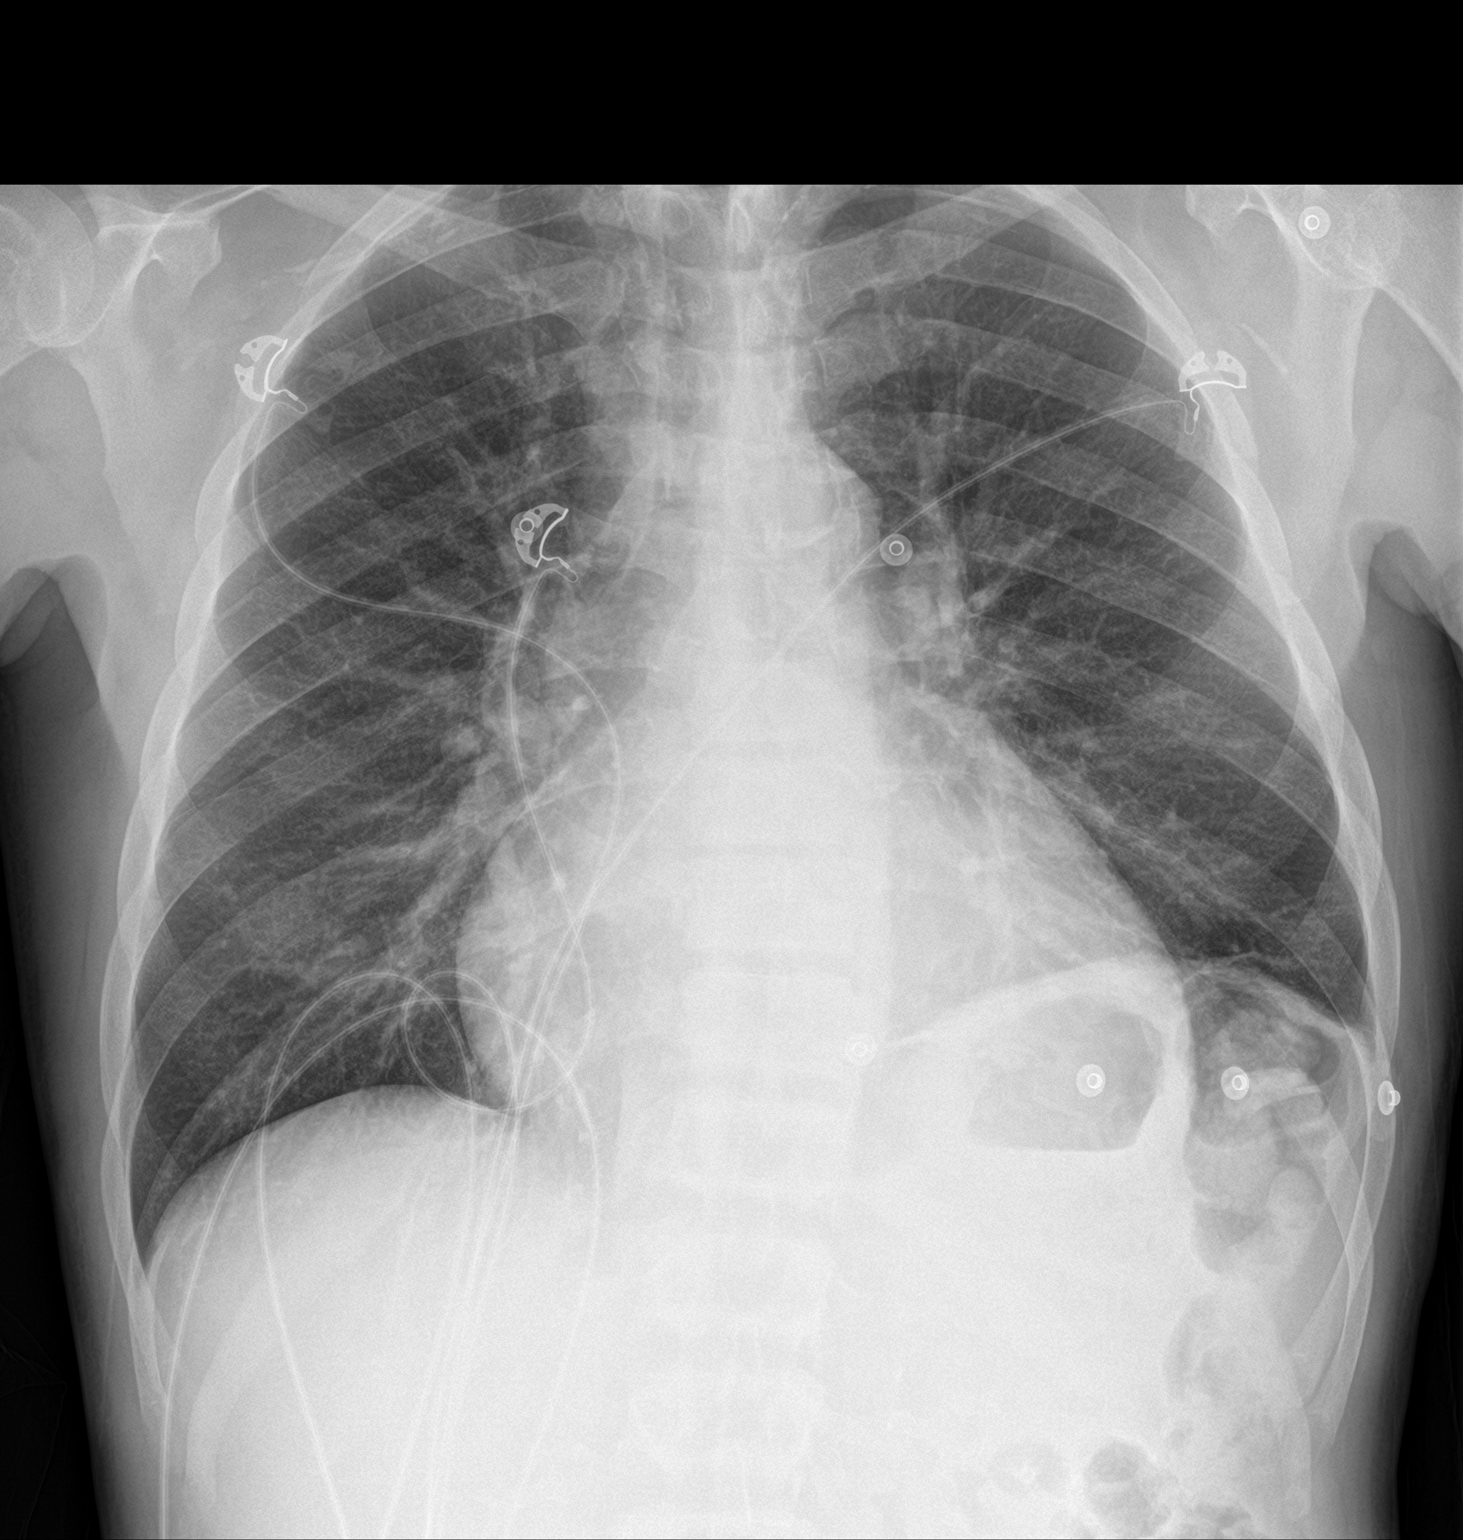

[chest lat]
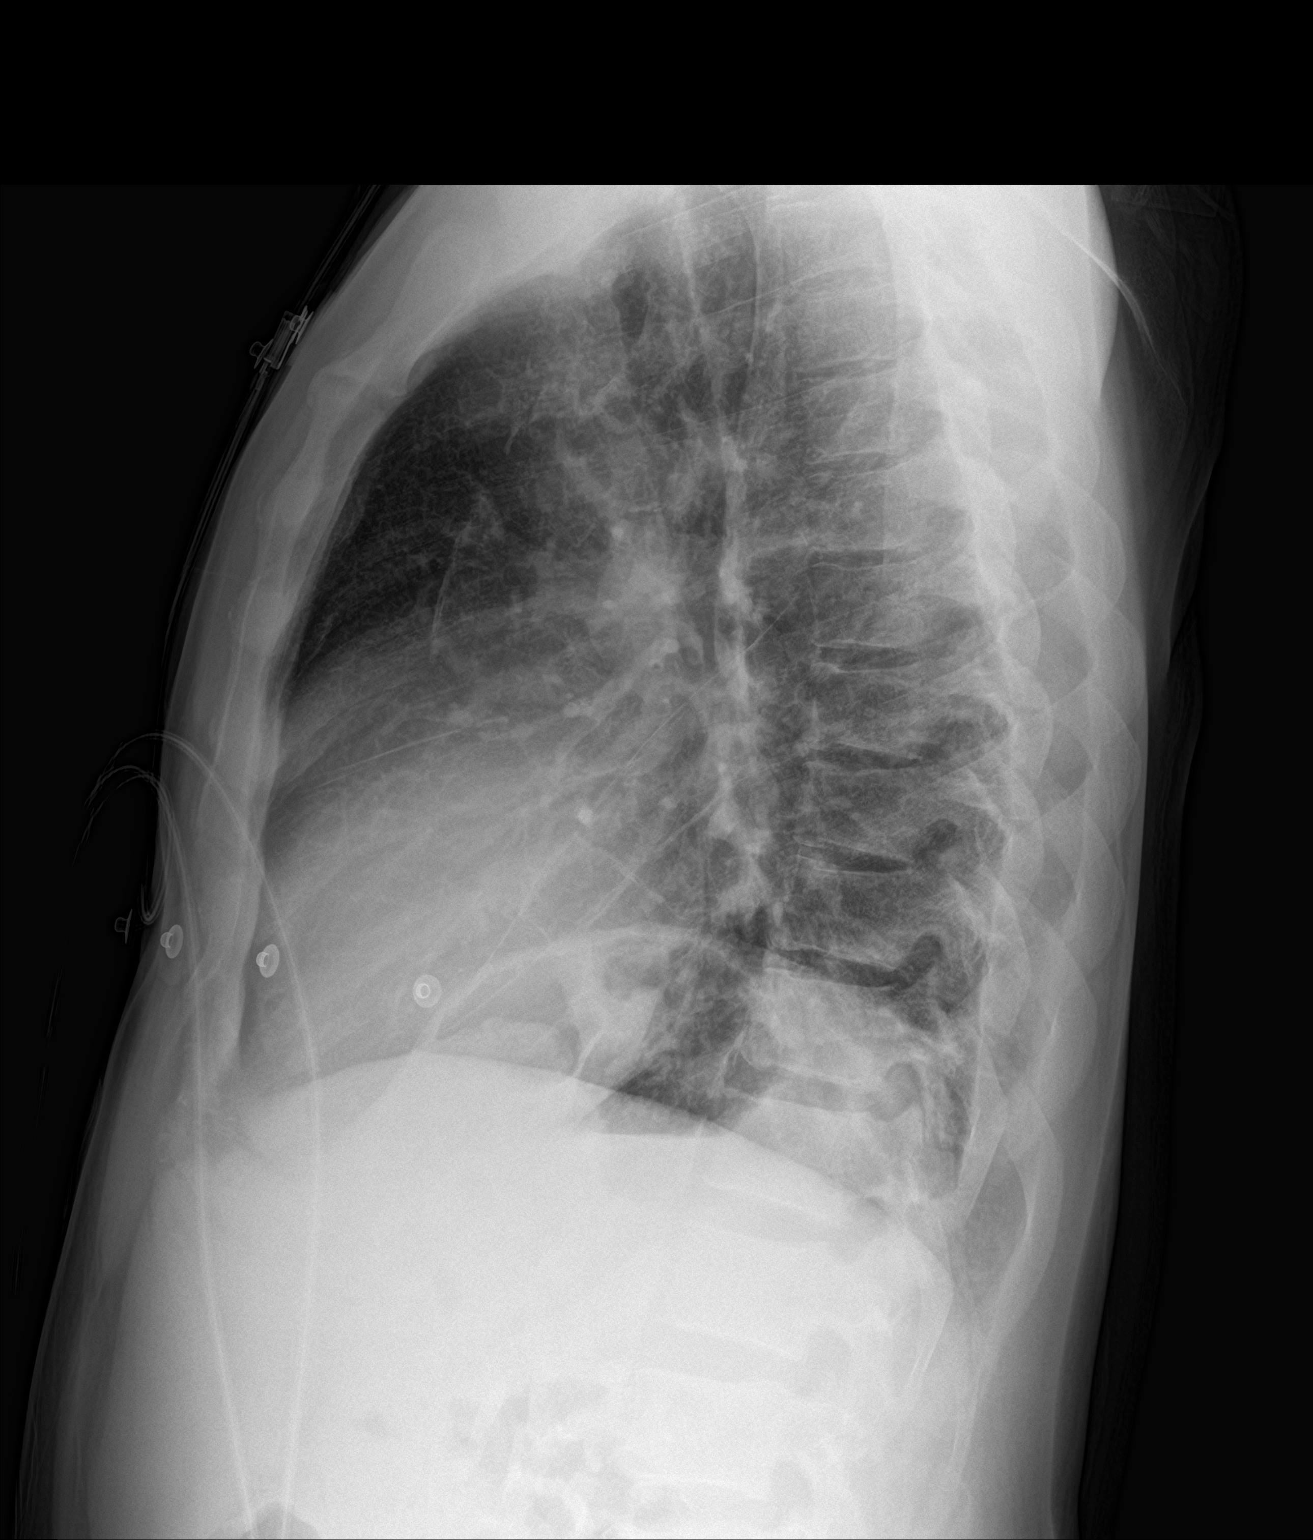

[2 of 2 positions shown; findings below may reference images not displayed]

FINDINGS: There is stable mild left base atelectasis. There is mild elevation
of the left hemidiaphragm, stable. There is no edema or
consolidation. Heart is mildly enlarged with pulmonary vascularity
within normal limits. There is aortic atherosclerosis. No
adenopathy. No bone lesions. No pneumothorax.
IMPRESSION: Stable mild left base atelectasis. No edema or consolidation. Stable
cardiac enlargement. There is aortic atherosclerosis.

Aortic Atherosclerosis ([id number]).

## 2018-07-17 DIAGNOSIS — D509 Iron deficiency anemia, unspecified: Secondary | ICD-10-CM | POA: Diagnosis not present

## 2018-07-17 DIAGNOSIS — N2581 Secondary hyperparathyroidism of renal origin: Secondary | ICD-10-CM | POA: Diagnosis not present

## 2018-07-17 DIAGNOSIS — D631 Anemia in chronic kidney disease: Secondary | ICD-10-CM | POA: Diagnosis not present

## 2018-07-17 DIAGNOSIS — N186 End stage renal disease: Secondary | ICD-10-CM | POA: Diagnosis not present

## 2018-07-19 DIAGNOSIS — N186 End stage renal disease: Secondary | ICD-10-CM | POA: Diagnosis not present

## 2018-07-19 DIAGNOSIS — N2581 Secondary hyperparathyroidism of renal origin: Secondary | ICD-10-CM | POA: Diagnosis not present

## 2018-07-19 DIAGNOSIS — D631 Anemia in chronic kidney disease: Secondary | ICD-10-CM | POA: Diagnosis not present

## 2018-07-19 DIAGNOSIS — D509 Iron deficiency anemia, unspecified: Secondary | ICD-10-CM | POA: Diagnosis not present

## 2018-07-21 DIAGNOSIS — N186 End stage renal disease: Secondary | ICD-10-CM | POA: Diagnosis not present

## 2018-07-21 DIAGNOSIS — D509 Iron deficiency anemia, unspecified: Secondary | ICD-10-CM | POA: Diagnosis not present

## 2018-07-21 DIAGNOSIS — D631 Anemia in chronic kidney disease: Secondary | ICD-10-CM | POA: Diagnosis not present

## 2018-07-21 DIAGNOSIS — N2581 Secondary hyperparathyroidism of renal origin: Secondary | ICD-10-CM | POA: Diagnosis not present

## 2018-07-22 ENCOUNTER — Emergency Department (HOSPITAL_COMMUNITY): Payer: Medicare Other

## 2018-07-22 ENCOUNTER — Other Ambulatory Visit: Payer: Self-pay

## 2018-07-22 ENCOUNTER — Emergency Department (HOSPITAL_COMMUNITY)
Admission: EM | Admit: 2018-07-22 | Discharge: 2018-07-22 | Disposition: A | Discharge status: Home / Self Care | Payer: Medicare Other | Attending: Emergency Medicine | Admitting: Emergency Medicine

## 2018-07-22 DIAGNOSIS — I120 Hypertensive chronic kidney disease with stage 5 chronic kidney disease or end stage renal disease: Secondary | ICD-10-CM | POA: Diagnosis not present

## 2018-07-22 DIAGNOSIS — Z79899 Other long term (current) drug therapy: Secondary | ICD-10-CM | POA: Diagnosis not present

## 2018-07-22 DIAGNOSIS — R109 Unspecified abdominal pain: Secondary | ICD-10-CM | POA: Diagnosis not present

## 2018-07-22 DIAGNOSIS — Z7982 Long term (current) use of aspirin: Secondary | ICD-10-CM | POA: Insufficient documentation

## 2018-07-22 DIAGNOSIS — N186 End stage renal disease: Secondary | ICD-10-CM | POA: Diagnosis not present

## 2018-07-22 DIAGNOSIS — K7031 Alcoholic cirrhosis of liver with ascites: Secondary | ICD-10-CM | POA: Diagnosis not present

## 2018-07-22 DIAGNOSIS — J449 Chronic obstructive pulmonary disease, unspecified: Secondary | ICD-10-CM | POA: Insufficient documentation

## 2018-07-22 DIAGNOSIS — I252 Old myocardial infarction: Secondary | ICD-10-CM | POA: Diagnosis not present

## 2018-07-22 DIAGNOSIS — Z992 Dependence on renal dialysis: Secondary | ICD-10-CM | POA: Insufficient documentation

## 2018-07-22 DIAGNOSIS — R1084 Generalized abdominal pain: Secondary | ICD-10-CM | POA: Diagnosis not present

## 2018-07-22 DIAGNOSIS — F17210 Nicotine dependence, cigarettes, uncomplicated: Secondary | ICD-10-CM | POA: Insufficient documentation

## 2018-07-22 DIAGNOSIS — I10 Essential (primary) hypertension: Secondary | ICD-10-CM | POA: Diagnosis not present

## 2018-07-22 LAB — COMPREHENSIVE METABOLIC PANEL
ALT: 12 U/L (ref 0–44)
AST: 29 U/L (ref 15–41)
Albumin: 4 g/dL (ref 3.5–5.0)
Alkaline Phosphatase: 120 U/L (ref 38–126)
Anion gap: 15 (ref 5–15)
BUN: 17 mg/dL (ref 6–20)
CO2: 26 mmol/L (ref 22–32)
Calcium: 8 mg/dL — ABNORMAL LOW (ref 8.9–10.3)
Chloride: 94 mmol/L — ABNORMAL LOW (ref 98–111)
Creatinine, Ser: 6.55 mg/dL — ABNORMAL HIGH (ref 0.61–1.24)
GFR calc Af Amer: 10 mL/min — ABNORMAL LOW (ref >60)
GFR calc non Af Amer: 9 mL/min — ABNORMAL LOW (ref >60)
Glucose, Bld: 113 mg/dL — ABNORMAL HIGH (ref 70–99)
Potassium: 3.9 mmol/L (ref 3.5–5.1)
Sodium: 135 mmol/L (ref 135–145)
Total Bilirubin: 0.9 mg/dL (ref 0.3–1.2)
Total Protein: 7.9 g/dL (ref 6.5–8.1)

## 2018-07-22 LAB — I-STAT CHEM 8, ED
BUN: 21 mg/dL — ABNORMAL HIGH (ref 6–20)
Calcium, Ion: 0.9 mmol/L — ABNORMAL LOW (ref 1.15–1.40)
Chloride: 93 mmol/L — ABNORMAL LOW (ref 98–111)
Creatinine, Ser: 6.9 mg/dL — ABNORMAL HIGH (ref 0.61–1.24)
Glucose, Bld: 113 mg/dL — ABNORMAL HIGH (ref 70–99)
HCT: 33 % — ABNORMAL LOW (ref 39.0–52.0)
Hemoglobin: 11.2 g/dL — ABNORMAL LOW (ref 13.0–17.0)
Potassium: 4 mmol/L (ref 3.5–5.1)
Sodium: 134 mmol/L — ABNORMAL LOW (ref 135–145)
TCO2: 31 mmol/L (ref 22–32)

## 2018-07-22 LAB — CBC WITH DIFFERENTIAL/PLATELET
Abs Immature Granulocytes: 0.02 10*3/uL (ref 0.00–0.07)
Basophils Absolute: 0.1 10*3/uL (ref 0.0–0.1)
Basophils Relative: 1 %
Eosinophils Absolute: 0.2 10*3/uL (ref 0.0–0.5)
Eosinophils Relative: 3 %
HCT: 30.2 % — ABNORMAL LOW (ref 39.0–52.0)
Hemoglobin: 10 g/dL — ABNORMAL LOW (ref 13.0–17.0)
Immature Granulocytes: 0 %
Lymphocytes Relative: 20 %
Lymphs Abs: 1.4 10*3/uL (ref 0.7–4.0)
MCH: 26.9 pg (ref 26.0–34.0)
MCHC: 33.1 g/dL (ref 30.0–36.0)
MCV: 81.2 fL (ref 80.0–100.0)
Monocytes Absolute: 0.5 10*3/uL (ref 0.1–1.0)
Monocytes Relative: 8 %
Neutro Abs: 4.6 10*3/uL (ref 1.7–7.7)
Neutrophils Relative %: 68 %
Platelets: 106 10*3/uL — ABNORMAL LOW (ref 150–400)
RBC: 3.72 MIL/uL — ABNORMAL LOW (ref 4.22–5.81)
RDW: 15.9 % — ABNORMAL HIGH (ref 11.5–15.5)
WBC: 6.8 10*3/uL (ref 4.0–10.5)
nRBC: 0 % (ref 0.0–0.2)

## 2018-07-22 LAB — LIPASE, BLOOD: Lipase: 33 U/L (ref 11–51)

## 2018-07-22 MED ORDER — FENTANYL CITRATE (PF) 100 MCG/2ML IJ SOLN
50.0000 ug | Freq: Once | INTRAMUSCULAR | Status: AC
  Administered 2018-07-22: 50 ug via INTRAVENOUS
  Filled 2018-07-22: qty 2

## 2018-07-22 MED ORDER — IOHEXOL 300 MG/ML  SOLN
100.0000 mL | Freq: Once | INTRAMUSCULAR | Status: AC | PRN
  Administered 2018-07-22: 100 mL via INTRAVENOUS

## 2018-07-22 NOTE — ED Notes (Signed)
Patient verbalizes understanding of discharge instructions. Opportunity for questioning and answers were provided. Armband removed by staff, pt discharged from ED.  

## 2018-07-22 NOTE — ED Triage Notes (Signed)
Pt presents to ED via EMS with ongoing upper mid abdominal pain and bloating. Pt has mass in abdomen and has seen doctor but hasn't had any imaging done yet. MWF dialysis pt.

## 2018-07-22 NOTE — ED Notes (Signed)
Patient transported to CT 

## 2018-07-22 NOTE — ED Provider Notes (Signed)
[facility] EMERGENCY DEPARTMENT Provider Note   CSN: [id number] Arrival date & time: 07/22/18  1603     History   Chief Complaint Chief Complaint  Patient presents with  . Abdominal Pain    HPI [name] is a 58 y.o. male.  Patient reports a lump in his abdomen for 2 to 3 months.  He is able to eat without vomiting.  He claims to be regularly constipated.  Past medical history includes end-stage renal disease and is on dialysis Monday Wednesday and Friday.  No fever, sweats, chills, weight loss, bloody stool.  Severity is minimal.     Past Medical History:  Diagnosis Date  . Anemia   . Anxiety   . Arthritis    left shoulder  . Atherosclerosis of aorta ([place])   . Cardiomegaly   . Chest pain    DATE UNKNOWN, C/O PERIODICALLY  . Cocaine abuse ([place])   . COPD exacerbation ([place]) 08/17/2016  . Coronary artery disease    stent 02/22/17  . ESRD (end stage renal disease) on dialysis ([place])    "[name]; MWF" (07/04/2017)  . GERD (gastroesophageal reflux disease)    DATE UNKNOWN  . Hemorrhoids   . Hepatitis B, chronic ([place])   . Hepatitis C   . History of kidney stones   . Hyperkalemia   . Hypertension   . Kidney failure   . Metabolic bone disease    Patient denies  . Mitral stenosis   . Myocardial infarction ([place])   . Pneumonia   . Pulmonary edema   . Solitary rectal ulcer syndrome 07/2017   at flex sig for rectal bleeding  . Tubular adenoma of colon     Patient Active Problem List   Diagnosis Date Noted  . CAD (coronary artery disease) 03/11/2018  . Benign neoplasm of cecum   . Benign neoplasm of ascending colon   . Benign neoplasm of descending colon   . Benign neoplasm of rectum   . Atrial fibrillation ([place]) 01/23/2018  . Hx of colonic polyps 01/20/2018  . ESRD (end stage renal disease) ([place]) 11/21/2017  . GERD (gastroesophageal reflux disease) 11/16/2017  . Liver cirrhosis ([place]) 11/15/2017  . DNR (do not resuscitate)   .  Palliative care by specialist   . Hyponatremia 11/04/2017  . SBP (spontaneous bacterial peritonitis) ([place]) 10/30/2017  . Liver disease, chronic 10/30/2017  . SOB (shortness of breath)   . Abdominal pain 10/28/2017  . Upper airway cough syndrome with flattening on f/v loop 10/13/17 c/w vcd 10/17/2017  . Elevated diaphragm 10/13/2017  . Ileus ([place]) 09/29/2017  . Malnutrition of moderate degree 09/29/2017  . Sinus congestion 09/03/2017  . Symptomatic anemia 09/02/2017  . Other cirrhosis of liver ([place]) 09/02/2017  . Left bundle branch block 09/02/2017  . Mitral stenosis 09/02/2017  . Hematochezia 07/15/2017  . Wide-complex tachycardia ([place])   . Endotracheally intubated   . ESRD on dialysis ([place]) 07/04/2017  . Acute respiratory failure with hypoxia ([place]) 06/18/2017  . CKD (chronic kidney disease) stage V requiring chronic dialysis ([place]) 06/18/2017  . History of Cocaine abuse ([place]) 06/18/2017  . Hypertension 06/18/2017  . Infection of AV graft for dialysis ([place]) 06/18/2017  . Anxiety 06/18/2017  . Anemia due to chronic kidney disease 06/18/2017  . Atrial flutter with rapid ventricular response ([place]) 06/18/2017  . Personality disorder ([place]) 06/13/2017  . Cellulitis 06/12/2017  . Adjustment disorder with mixed anxiety and depressed mood 06/10/2017  . Suicidal ideation 06/10/2017  .  Arm wound, left, sequela 06/10/2017  . Dyspnea on exertion 05/29/2017  . Tachycardia 05/29/2017  . Hyperkalemia 05/22/2017  . Acute metabolic encephalopathy   . Anemia 04/23/2017  . Ascites 04/23/2017  . COPD (chronic obstructive pulmonary disease) ([place]) 04/23/2017  . Acute on chronic respiratory failure with hypoxia ([place]) 03/25/2017  . Arrhythmia 03/25/2017  . COPD GOLD 0 with flattening on inps f/v  09/27/2016  . Essential hypertension 09/27/2016  . Fluid overload 08/30/2016  . COPD exacerbation ([place]) 08/17/2016  . Hypertensive urgency 08/17/2016  . Respiratory failure ([place]) 08/17/2016  . Problem  with dialysis access ([place]) 07/23/2016  . Chronic hepatitis B ([place]) 03/05/2014  . Chronic hepatitis C without hepatic coma ([place]) 03/05/2014  . Internal hemorrhoids with bleeding, swelling and itching 03/05/2014  . Thrombocytopenia ([place]) 03/05/2014  . Chest pain 02/27/2014  . Alcohol abuse 04/14/2009  . Cigarette smoker 04/14/2009  . GANGLION CYST 04/14/2009    Past Surgical History:  Procedure Laterality Date  . A/V FISTULAGRAM Left 05/26/2017   Procedure: A/V FISTULAGRAM;  Surgeon: Conrad [place] Point, MD;  Location: [facility];  Service: Cardiovascular;  Laterality: Left;  . A/V FISTULAGRAM Right 11/18/2017   Procedure: A/V FISTULAGRAM - Right Arm;  Surgeon: Elam Dutch, MD;  Location: [facility];  Service: Cardiovascular;  Laterality: Right;  . APPLICATION OF WOUND VAC Left 06/14/2017   Procedure: APPLICATION OF WOUND VAC;  Surgeon: Katha Cabal, MD;  Location: [facility] ORS;  Service: Vascular;  Laterality: Left;  . AV FISTULA PLACEMENT  2012   BELIEVED WAS PLACED IN JUNE  . AV FISTULA PLACEMENT Right 08/09/2017   Procedure: Creation Right arm ARTERIOVENOUS BRACHIOCEPOHALIC FISTULA;  Surgeon: Elam Dutch, MD;  Location[facility];  Service: Vascular;  Laterality: Right;  . AV FISTULA PLACEMENT Right 11/22/2017   Procedure: INSERTION OF ARTERIOVENOUS (AV) GORE-TEX GRAFT RIGHT UPPER ARM;  Surgeon: Elam Dutch, MD;  Location: [facility];  Service: Vascular;  Laterality: Right;  . BIOPSY  01/25/2018   Procedure: BIOPSY;  Surgeon: Jerene Bears, MD;  Location: [facility] ENDOSCOPY;  Service: Gastroenterology;;  . COLONOSCOPY    . COLONOSCOPY WITH PROPOFOL N/A 01/25/2018   Procedure: COLONOSCOPY WITH PROPOFOL;  Surgeon: Jerene Bears, MD;  Location: [facility];  Service: Gastroenterology;  Laterality: N/A;  . CORONARY STENT INTERVENTION N/A 02/22/2017   Procedure: CORONARY STENT INTERVENTION;  Surgeon: Nigel Mormon, MD;  Location: [facility] CV LAB;  Service:  Cardiovascular;  Laterality: N/A;  . ESOPHAGOGASTRODUODENOSCOPY (EGD) WITH PROPOFOL N/A 01/25/2018   Procedure: ESOPHAGOGASTRODUODENOSCOPY (EGD) WITH PROPOFOL;  Surgeon: Jerene Bears, MD;  Location: [facility];  Service: Gastroenterology;  Laterality: N/A;  . FLEXIBLE SIGMOIDOSCOPY N/A 07/15/2017   Procedure: FLEXIBLE SIGMOIDOSCOPY;  Surgeon: Carol Ada, MD;  Location: [facility];  Service: Endoscopy;  Laterality: N/A;  . HEMORRHOID BANDING    . I&D EXTREMITY Left 06/01/2017   Procedure: IRRIGATION AND DEBRIDEMENT LEFT ARM HEMATOMA WITH LIGATION OF LEFT ARM AV FISTULA;  Surgeon: Elam Dutch, MD;  Location: [facility];  Service: Vascular;  Laterality: Left;  . I&D EXTREMITY Left 06/14/2017   Procedure: IRRIGATION AND DEBRIDEMENT EXTREMITY;  Surgeon: Katha Cabal, MD;  Location: [facility];  Service: Vascular;  Laterality: Left;  . INSERTION OF DIALYSIS CATHETER  05/30/2017  . INSERTION OF DIALYSIS CATHETER N/A 05/30/2017   Procedure: INSERTION OF DIALYSIS CATHETER;  Surgeon: Elam Dutch, MD;  Location: [facility];  Service: Vascular;  Laterality: N/A;  . IR  PARACENTESIS  08/30/2017  . IR PARACENTESIS  09/29/2017  . IR PARACENTESIS  10/28/2017  . IR PARACENTESIS  11/09/2017  . IR PARACENTESIS  11/16/2017  . IR PARACENTESIS  11/28/2017  . IR PARACENTESIS  12/01/2017  . IR PARACENTESIS  12/06/2017  . IR PARACENTESIS  01/03/2018  . IR PARACENTESIS  01/23/2018  . IR PARACENTESIS  02/07/2018  . IR PARACENTESIS  02/21/2018  . IR PARACENTESIS  03/06/2018  . IR PARACENTESIS  03/17/2018  . IR PARACENTESIS  04/04/2018  . IR RADIOLOGIST EVAL & MGMT  02/14/2018  . LEFT HEART CATH AND CORONARY ANGIOGRAPHY N/A 02/22/2017   Procedure: LEFT HEART CATH AND CORONARY ANGIOGRAPHY;  Surgeon: Nigel Mormon, MD;  Location: [facility];  Service: Cardiovascular;  Laterality: N/A;  . LIGATION OF ARTERIOVENOUS  FISTULA Left 01/11/7105   Procedure: Plication of Left Arm Arteriovenous Fistula;  Surgeon: Elam Dutch, MD;  Location: [facility];  Service: Vascular;  Laterality: Left;  . POLYPECTOMY    . POLYPECTOMY  01/25/2018   Procedure: POLYPECTOMY;  Surgeon: Jerene Bears, MD;  Location: [facility];  Service: Gastroenterology;;  . REVISON OF ARTERIOVENOUS FISTULA Left 2/69/4854   Procedure: PLICATION OF DISTAL ANEURYSMAL SEGEMENT OF LEFT UPPER ARM ARTERIOVENOUS FISTULA;  Surgeon: Elam Dutch, MD;  Location: [facility];  Service: Vascular;  Laterality: Left;  . REVISON OF ARTERIOVENOUS FISTULA Left 01/05/349   Procedure: Plication of Left Upper Arm Fistula ;  Surgeon: Waynetta Sandy, MD;  Location: [facility];  Service: Vascular;  Laterality: Left;  . SKIN GRAFT SPLIT THICKNESS LEG / FOOT Left    SKIN GRAFT SPLIT THICKNESS LEFT ARM DONOR SITE: LEFT ANTERIOR THIGH  . SKIN SPLIT GRAFT Left 07/04/2017   Procedure: SKIN GRAFT SPLIT THICKNESS LEFT ARM DONOR SITE: LEFT ANTERIOR THIGH;  Surgeon: Elam Dutch, MD;  Location: [facility];  Service: Vascular;  Laterality: Left;  . THROMBECTOMY W/ EMBOLECTOMY Left 06/05/2017   Procedure: EXPLORATION OF LEFT ARM FOR BLEEDING; OVERSEWED PROXIMAL FISTULA;  Surgeon: Angelia Mould, MD;  Location: [facility];  Service: Vascular;  Laterality: Left;  . WOUND EXPLORATION Left 06/03/2017   Procedure: WOUND EXPLORATION WITH WOUND VAC APPLICATION TO LEFT ARM;  Surgeon: Angelia Mould, MD;  Location: [facility];  Service: Vascular;  Laterality: Left;        Home Medications    Prior to Admission medications   Medication Sig Start Date End Date Taking? Authorizing Provider  albuterol (VENTOLIN HFA) 108 (90 Base) MCG/ACT inhaler Inhale 2 puffs into the lungs every 6 (six) hours as needed for wheezing or shortness of breath.   Yes [provider]  gabapentin (NEURONTIN) 100 MG capsule Take 100 mg by mouth 2 (two) times daily. 12/13/17  Yes [provider]  hydrALAZINE (APRESOLINE) 100 MG tablet Take 1 tablet (100 mg total) by mouth 2 (two) times  daily as needed. Taking after HD on Dialysis days Patient taking differently: Take 100 mg by mouth 2 (two) times daily as needed (high blood pressure). If needed on Monday, Wednesday, Friday take after dialysis 03/11/18  Yes Vann, Jessica U, DO  lactulose (CHRONULAC) 10 GM/15ML solution Take 45 mLs (30 g total) by mouth 2 (two) times daily. 03/11/18  Yes Eulogio Bear U, DO  metoprolol tartrate (LOPRESSOR) 25 MG tablet Take 1 tablet (25 mg total) by mouth 2 (two) times daily. 03/11/18  Yes Vann, Jessica U, DO  pantoprazole (PROTONIX) 40 MG tablet Take 1 tablet (40 mg  total) by mouth daily at 12 noon. Patient taking differently: Take 40 mg by mouth See admin instructions. Take one tablet (40 mg) by mouth in the morning on Sunday, Tuesday, Thursday, Saturday, take one tablet (40 mg) after dialysis on Monday, Wednesday, Friday. May also take one tablet (40 mg) in the evening as needed for acid reflux. 03/12/18  Yes Geradine Girt, DO  sevelamer carbonate (RENVELA) 800 MG tablet Take 4 tablets with meals  And 2 tablets twice daily with snacks Patient taking differently: Take 2,400-3,200 mg by mouth See admin instructions. Take 3 - 4 tablets (2400 - 3200 mg) up to five times daily with meals or snacks 01/22/18  Yes Patrecia Pour, MD  traZODone (DESYREL) 50 MG tablet Take 50 mg by mouth at bedtime as needed for sleep.    Yes [provider]  aspirin 81 MG EC tablet Take 1 tablet (81 mg total) by mouth daily. Patient not taking: Reported on 07/22/2018 03/12/18   Geradine Girt, DO    Family History Family History  Problem Relation Age of Onset  . Heart disease Mother   . Lung cancer Mother   . Heart disease Father   . Malignant hyperthermia Father   . COPD Father   . Throat cancer Sister   . Esophageal cancer Sister   . Hypertension Other   . COPD Other   . Colon cancer Neg Hx   . Colon polyps Neg Hx   . Rectal cancer Neg Hx   . Stomach cancer Neg Hx     Social History Social History    Tobacco Use  . Smoking status: Current Every Day Smoker    Packs/day: 0.50    Years: 43.00    Pack years: 21.50    Types: Cigarettes    Start date: 08/13/1973  . Smokeless tobacco: Never Used  Substance Use Topics  . Alcohol use: Not Currently    Frequency: Never    Comment: quit drinking in 2017  . Drug use: Yes    Types: Marijuana    Comment: quit in 2017"     Allergies   Morphine and related; Aspirin; Clonidine derivatives; Tramadol; and Tylenol [acetaminophen]   Review of Systems Review of Systems  All other systems reviewed and are negative.    Physical Exam Updated Vital Signs BP (!) 182/86 (BP Location: Right Arm)   Pulse 80   Temp 97.9 F (36.6 C) (Oral)   Resp 16   Ht 5\' 9"  (1.753 m)   Wt 72.6 kg   SpO2 100%   BMI 23.63 kg/m   Physical Exam Vitals signs and nursing note reviewed.  Constitutional:      Appearance: He is well-developed.  HENT:     Head: Normocephalic and atraumatic.  Eyes:     Conjunctiva/sclera: Conjunctivae normal.  Neck:     Musculoskeletal: Neck supple.  Cardiovascular:     Rate and Rhythm: Normal rate and regular rhythm.  Pulmonary:     Effort: Pulmonary effort is normal.     Breath sounds: Normal breath sounds.  Abdominal:     General: Bowel sounds are normal.     Palpations: Abdomen is soft.     Comments: No palpable mass in the abdomen.  Musculoskeletal: Normal range of motion.  Skin:    General: Skin is warm and dry.  Neurological:     Mental Status: He is alert and oriented to person, place, and time.  Psychiatric:  Behavior: Behavior normal.      ED Treatments / Results  Labs (all labs ordered are listed, but only abnormal results are displayed) Labs Reviewed  CBC WITH DIFFERENTIAL/PLATELET - Abnormal; Notable for the following components:      Result Value   RBC 3.72 (*)    Hemoglobin 10.0 (*)    HCT 30.2 (*)    RDW 15.9 (*)    Platelets 106 (*)    All other components within normal limits   COMPREHENSIVE METABOLIC PANEL - Abnormal; Notable for the following components:   Chloride 94 (*)    Glucose, Bld 113 (*)    Creatinine, Ser 6.55 (*)    Calcium 8.0 (*)    GFR calc non Af Amer 9 (*)    GFR calc Af Amer 10 (*)    All other components within normal limits  I-STAT CHEM 8, ED - Abnormal; Notable for the following components:   Sodium 134 (*)    Chloride 93 (*)    BUN 21 (*)    Creatinine, Ser 6.90 (*)    Glucose, Bld 113 (*)    Calcium, Ion 0.90 (*)    Hemoglobin 11.2 (*)    HCT 33.0 (*)    All other components within normal limits  LIPASE, BLOOD    EKG EKG Interpretation  Date/Time:  Saturday July 22 2018 16:17:35 EST Ventricular Rate:  80 PR Interval:    QRS Duration: 181 QT Interval:  513 QTC Calculation: 592 R Axis:   -112 Text Interpretation:  Sinus or ectopic atrial rhythm Nonspecific IVCD with LAD Consider left ventricular hypertrophy Abnormal T, consider ischemia, lateral leads Confirmed by Nat Christen [id number]) on 07/22/2018 4:23:39 PM   Radiology Ct Abdomen Pelvis W Contrast  Result Date: 07/22/2018 CLINICAL DATA:  58 year old with current history of end-stage renal disease on hemodialysis, presenting with mid epigastric abdominal pain and a possible palpable mass in the supraumbilical abdomen, possibly a hernia. Current history of chronic hepatitis B and hepatitis C with multiple prior paracenteses. EXAM: CT ABDOMEN AND PELVIS WITH CONTRAST TECHNIQUE: Multidetector CT imaging of the abdomen and pelvis was performed using the standard protocol following bolus administration of intravenous contrast. CONTRAST:  156mL OMNIPAQUE IOHEXOL 300 MG/ML IV. COMPARISON:  01/02/2018 and earlier. FINDINGS: Lower chest: Mild elevation the LEFT hemidiaphragm as noted previously. Visualized lung bases clear. Heart moderately enlarged. Mitral annular calcification. Severe three-vessel coronary atherosclerosis. Small pericardial effusion. Hepatobiliary: Mild hepatomegaly  and irregular hepatic contour as noted previously. No hepatic parenchymal mass. Geographic areas of hepatic steatosis throughout the liver. Gallbladder normal in appearance without calcified gallstones. No biliary ductal dilation. Pancreas: Normal in appearance without evidence of mass, ductal dilation, or inflammation. Spleen: Normal in size and appearance. Adrenals/Urinary Tract: Normal appearing adrenal glands. Atrophic kidneys containing multiple small cysts. No hydronephrosis. No urinary tract calculi. Urinary bladder decompressed which accounts for the apparent mild wall thickening. Stomach/Bowel: Stomach decompressed and normal in appearance. No evidence of bowel obstruction. Inspissated stool like material in several loops of small bowel throughout the abdomen. Entire colon relatively decompressed with expected stool burden. Appendix not visualized. Vascular/Lymphatic: Severe aortoiliofemoral and visceral artery atherosclerosis without evidence of aneurysm. No pathologic lymphadenopathy. Reproductive: Prostate gland and seminal vesicles normal in size and appearance for age. Other: Large amount of ascites. No evidence of abdominal wall hernia as questioned clinically. Musculoskeletal: Renal osteodystrophy. Degenerative disc disease at L5-S1. No acute findings. IMPRESSION: 1. Large amount of ascites. No acute abnormalities otherwise involving the abdomen or  pelvis. 2. Hepatic cirrhosis. Geographic areas of hepatic steatosis. No visible hepatic masses. 3. No evidence of abdominal wall hernia as questioned clinically. 4. Inspissated stool like material in several loops of small bowel throughout the abdomen likely indicating stasis. No evidence of bowel obstruction. 5. Atrophic kidneys indicating chronic renal failure with cystic renal disease of hemodialysis. 6.  Aortic Atherosclerosis (ICD10-170.0) 7. Marked cardiomegaly. Small pericardial effusion. Severe three-vessel coronary atherosclerosis. Electronically  Signed   By: Evangeline Dakin M.D.   On: 07/22/2018 18:42    Procedures Procedures (including critical care time)  Medications Ordered in ED Medications  iohexol (OMNIPAQUE) 300 MG/ML solution 100 mL (100 mLs Intravenous Contrast Given 07/22/18 1749)  fentaNYL (SUBLIMAZE) injection 50 mcg (50 mcg Intravenous Given 07/22/18 2015)  fentaNYL (SUBLIMAZE) injection 50 mcg (50 mcg Intravenous Given 07/22/18 2141)     Initial Impression / Assessment and Plan / ED Course  I have reviewed the triage vital signs and the nursing notes.  Pertinent labs & imaging results that were available during my care of the patient were reviewed by me and considered in my medical decision making (see chart for details).     Patient is in no acute distress.  CT abdomen and pelvis obtained to assess patient's concern of a "mass".  CT reveals ascites, cirrhosis, increased stool, cardiomegaly; no hernia.  No acute abdomen at discharge.  Final Clinical Impressions(s) / ED Diagnoses   Final diagnoses:  Abdominal pain, unspecified abdominal location    ED Discharge Orders    None       Nat Christen, MD 07/22/18 2348

## 2018-07-22 NOTE — Discharge Instructions (Addendum)
CT scan of your abdomen did not identify any unusual masses.  Use over-the-counter constipation medication.  Follow-up with your regular doctors.

## 2018-07-24 DIAGNOSIS — D631 Anemia in chronic kidney disease: Secondary | ICD-10-CM | POA: Diagnosis not present

## 2018-07-24 DIAGNOSIS — D509 Iron deficiency anemia, unspecified: Secondary | ICD-10-CM | POA: Diagnosis not present

## 2018-07-24 DIAGNOSIS — N186 End stage renal disease: Secondary | ICD-10-CM | POA: Diagnosis not present

## 2018-07-24 DIAGNOSIS — N2581 Secondary hyperparathyroidism of renal origin: Secondary | ICD-10-CM | POA: Diagnosis not present

## 2018-07-26 DIAGNOSIS — D509 Iron deficiency anemia, unspecified: Secondary | ICD-10-CM | POA: Diagnosis not present

## 2018-07-26 DIAGNOSIS — D631 Anemia in chronic kidney disease: Secondary | ICD-10-CM | POA: Diagnosis not present

## 2018-07-26 DIAGNOSIS — N2581 Secondary hyperparathyroidism of renal origin: Secondary | ICD-10-CM | POA: Diagnosis not present

## 2018-07-26 DIAGNOSIS — N186 End stage renal disease: Secondary | ICD-10-CM | POA: Diagnosis not present

## 2018-07-28 DIAGNOSIS — D631 Anemia in chronic kidney disease: Secondary | ICD-10-CM | POA: Diagnosis not present

## 2018-07-28 DIAGNOSIS — N186 End stage renal disease: Secondary | ICD-10-CM | POA: Diagnosis not present

## 2018-07-28 DIAGNOSIS — N2581 Secondary hyperparathyroidism of renal origin: Secondary | ICD-10-CM | POA: Diagnosis not present

## 2018-07-28 DIAGNOSIS — D509 Iron deficiency anemia, unspecified: Secondary | ICD-10-CM | POA: Diagnosis not present

## 2018-07-31 DIAGNOSIS — D509 Iron deficiency anemia, unspecified: Secondary | ICD-10-CM | POA: Diagnosis not present

## 2018-07-31 DIAGNOSIS — N2581 Secondary hyperparathyroidism of renal origin: Secondary | ICD-10-CM | POA: Diagnosis not present

## 2018-07-31 DIAGNOSIS — N186 End stage renal disease: Secondary | ICD-10-CM | POA: Diagnosis not present

## 2018-07-31 DIAGNOSIS — D631 Anemia in chronic kidney disease: Secondary | ICD-10-CM | POA: Diagnosis not present

## 2018-08-01 ENCOUNTER — Emergency Department (HOSPITAL_COMMUNITY)
Admission: EM | Admit: 2018-08-01 | Discharge: 2018-08-02 | Disposition: A | Discharge status: Home / Self Care | Payer: Medicare Other | Attending: Emergency Medicine | Admitting: Emergency Medicine

## 2018-08-01 DIAGNOSIS — I120 Hypertensive chronic kidney disease with stage 5 chronic kidney disease or end stage renal disease: Secondary | ICD-10-CM | POA: Insufficient documentation

## 2018-08-01 DIAGNOSIS — K7460 Unspecified cirrhosis of liver: Secondary | ICD-10-CM | POA: Diagnosis not present

## 2018-08-01 DIAGNOSIS — F17210 Nicotine dependence, cigarettes, uncomplicated: Secondary | ICD-10-CM | POA: Insufficient documentation

## 2018-08-01 DIAGNOSIS — Z5321 Procedure and treatment not carried out due to patient leaving prior to being seen by health care provider: Secondary | ICD-10-CM | POA: Insufficient documentation

## 2018-08-01 DIAGNOSIS — Z79899 Other long term (current) drug therapy: Secondary | ICD-10-CM | POA: Diagnosis not present

## 2018-08-01 DIAGNOSIS — R1013 Epigastric pain: Secondary | ICD-10-CM | POA: Insufficient documentation

## 2018-08-01 DIAGNOSIS — I10 Essential (primary) hypertension: Secondary | ICD-10-CM | POA: Diagnosis not present

## 2018-08-01 DIAGNOSIS — R109 Unspecified abdominal pain: Secondary | ICD-10-CM | POA: Diagnosis present

## 2018-08-01 DIAGNOSIS — Z992 Dependence on renal dialysis: Secondary | ICD-10-CM | POA: Diagnosis not present

## 2018-08-01 DIAGNOSIS — R1084 Generalized abdominal pain: Secondary | ICD-10-CM | POA: Diagnosis not present

## 2018-08-01 DIAGNOSIS — K5900 Constipation, unspecified: Secondary | ICD-10-CM | POA: Diagnosis not present

## 2018-08-01 DIAGNOSIS — R197 Diarrhea, unspecified: Secondary | ICD-10-CM | POA: Diagnosis not present

## 2018-08-01 DIAGNOSIS — R110 Nausea: Secondary | ICD-10-CM | POA: Diagnosis not present

## 2018-08-01 DIAGNOSIS — N186 End stage renal disease: Secondary | ICD-10-CM | POA: Diagnosis not present

## 2018-08-01 DIAGNOSIS — R52 Pain, unspecified: Secondary | ICD-10-CM | POA: Diagnosis not present

## 2018-08-01 LAB — CBC
HCT: 45.9 % (ref 39.0–52.0)
Hemoglobin: 16 g/dL (ref 13.0–17.0)
MCH: 30.8 pg (ref 26.0–34.0)
MCHC: 34.9 g/dL (ref 30.0–36.0)
MCV: 88.3 fL (ref 80.0–100.0)
Platelets: 440 10*3/uL — ABNORMAL HIGH (ref 150–400)
RBC: 5.2 MIL/uL (ref 4.22–5.81)
RDW: 12.1 % (ref 11.5–15.5)
WBC: 14.5 10*3/uL — ABNORMAL HIGH (ref 4.0–10.5)
nRBC: 0 % (ref 0.0–0.2)

## 2018-08-01 LAB — COMPREHENSIVE METABOLIC PANEL
ALT: 82 U/L — ABNORMAL HIGH (ref 0–44)
AST: 42 U/L — ABNORMAL HIGH (ref 15–41)
Albumin: 4.8 g/dL (ref 3.5–5.0)
Alkaline Phosphatase: 66 U/L (ref 38–126)
Anion gap: 12 (ref 5–15)
BUN: 15 mg/dL (ref 6–20)
CO2: 25 mmol/L (ref 22–32)
Calcium: 10.1 mg/dL (ref 8.9–10.3)
Chloride: 100 mmol/L (ref 98–111)
Creatinine, Ser: 0.84 mg/dL (ref 0.61–1.24)
GFR calc Af Amer: >60 mL/min (ref >60)
GFR calc non Af Amer: >60 mL/min (ref >60)
Glucose, Bld: 101 mg/dL — ABNORMAL HIGH (ref 70–99)
Potassium: 3.4 mmol/L — ABNORMAL LOW (ref 3.5–5.1)
Sodium: 137 mmol/L (ref 135–145)
Total Bilirubin: 0.6 mg/dL (ref 0.3–1.2)
Total Protein: 8.6 g/dL — ABNORMAL HIGH (ref 6.5–8.1)

## 2018-08-01 LAB — LIPASE, BLOOD: Lipase: 27 U/L (ref 11–51)

## 2018-08-01 MED ORDER — SODIUM CHLORIDE 0.9% FLUSH: 3.0000 mL | Freq: Once | INTRAVENOUS | Status: DC

## 2018-08-01 NOTE — ED Triage Notes (Signed)
BIB EMS form home. Pt reports abd pain constipation X few days. States he has a "mass" in his abd. Dialysis pt.

## 2018-08-01 NOTE — ED Notes (Signed)
No reply for vitals

## 2018-08-02 ENCOUNTER — Other Ambulatory Visit: Payer: Self-pay

## 2018-08-02 ENCOUNTER — Encounter (HOSPITAL_COMMUNITY): Payer: Self-pay | Admitting: *Deleted

## 2018-08-02 ENCOUNTER — Emergency Department (HOSPITAL_COMMUNITY)
Admission: EM | Admit: 2018-08-02 | Discharge: 2018-08-03 | Disposition: A | Discharge status: Home / Self Care | Payer: Medicare Other | Source: Home / Self Care | Attending: Emergency Medicine | Admitting: Emergency Medicine

## 2018-08-02 DIAGNOSIS — N186 End stage renal disease: Secondary | ICD-10-CM | POA: Insufficient documentation

## 2018-08-02 DIAGNOSIS — I120 Hypertensive chronic kidney disease with stage 5 chronic kidney disease or end stage renal disease: Secondary | ICD-10-CM | POA: Insufficient documentation

## 2018-08-02 DIAGNOSIS — R1013 Epigastric pain: Secondary | ICD-10-CM | POA: Insufficient documentation

## 2018-08-02 DIAGNOSIS — Z79899 Other long term (current) drug therapy: Secondary | ICD-10-CM

## 2018-08-02 DIAGNOSIS — F17210 Nicotine dependence, cigarettes, uncomplicated: Secondary | ICD-10-CM | POA: Insufficient documentation

## 2018-08-02 DIAGNOSIS — J441 Chronic obstructive pulmonary disease with (acute) exacerbation: Secondary | ICD-10-CM | POA: Insufficient documentation

## 2018-08-02 DIAGNOSIS — D509 Iron deficiency anemia, unspecified: Secondary | ICD-10-CM | POA: Diagnosis not present

## 2018-08-02 DIAGNOSIS — R1084 Generalized abdominal pain: Secondary | ICD-10-CM | POA: Diagnosis not present

## 2018-08-02 DIAGNOSIS — I10 Essential (primary) hypertension: Secondary | ICD-10-CM | POA: Diagnosis not present

## 2018-08-02 DIAGNOSIS — R110 Nausea: Secondary | ICD-10-CM | POA: Diagnosis not present

## 2018-08-02 DIAGNOSIS — N2581 Secondary hyperparathyroidism of renal origin: Secondary | ICD-10-CM | POA: Diagnosis not present

## 2018-08-02 DIAGNOSIS — M5489 Other dorsalgia: Secondary | ICD-10-CM | POA: Diagnosis not present

## 2018-08-02 DIAGNOSIS — D631 Anemia in chronic kidney disease: Secondary | ICD-10-CM | POA: Diagnosis not present

## 2018-08-02 LAB — CBC
HCT: 28.9 % — ABNORMAL LOW (ref 39.0–52.0)
Hemoglobin: 9.7 g/dL — ABNORMAL LOW (ref 13.0–17.0)
MCH: 26.5 pg (ref 26.0–34.0)
MCHC: 33.6 g/dL (ref 30.0–36.0)
MCV: 79 fL — ABNORMAL LOW (ref 80.0–100.0)
Platelets: 89 10*3/uL — ABNORMAL LOW (ref 150–400)
RBC: 3.66 MIL/uL — ABNORMAL LOW (ref 4.22–5.81)
RDW: 14.9 % (ref 11.5–15.5)
WBC: 6.7 10*3/uL (ref 4.0–10.5)
nRBC: 0 % (ref 0.0–0.2)

## 2018-08-02 LAB — COMPREHENSIVE METABOLIC PANEL
ALT: 14 U/L (ref 0–44)
AST: 28 U/L (ref 15–41)
Albumin: 4 g/dL (ref 3.5–5.0)
Alkaline Phosphatase: 142 U/L — ABNORMAL HIGH (ref 38–126)
Anion gap: 14 (ref 5–15)
BUN: 14 mg/dL (ref 6–20)
CO2: 28 mmol/L (ref 22–32)
Calcium: 8.2 mg/dL — ABNORMAL LOW (ref 8.9–10.3)
Chloride: 93 mmol/L — ABNORMAL LOW (ref 98–111)
Creatinine, Ser: 4.85 mg/dL — ABNORMAL HIGH (ref 0.61–1.24)
GFR calc Af Amer: 14 mL/min — ABNORMAL LOW (ref >60)
GFR calc non Af Amer: 13 mL/min — ABNORMAL LOW (ref >60)
Glucose, Bld: 98 mg/dL (ref 70–99)
Potassium: 3.3 mmol/L — ABNORMAL LOW (ref 3.5–5.1)
Sodium: 135 mmol/L (ref 135–145)
Total Bilirubin: 0.7 mg/dL (ref 0.3–1.2)
Total Protein: 7.5 g/dL (ref 6.5–8.1)

## 2018-08-02 LAB — LIPASE, BLOOD: Lipase: 39 U/L (ref 11–51)

## 2018-08-02 MED ORDER — SODIUM CHLORIDE 0.9% FLUSH
3.0000 mL | Freq: Once | INTRAVENOUS | Status: AC
  Administered 2018-08-03: 3 mL via INTRAVENOUS

## 2018-08-02 MED ORDER — ONDANSETRON 4 MG PO TBDP
4.0000 mg | ORAL_TABLET | Freq: Once | ORAL | Status: AC
  Administered 2018-08-02: 4 mg via ORAL
  Filled 2018-08-02: qty 1

## 2018-08-02 NOTE — ED Notes (Signed)
Lab called and request recollect of CBC

## 2018-08-02 NOTE — ED Triage Notes (Signed)
Pt says that he has a "knot" in his left upper abdomen for some time. He has also had sharp abdominal pain and nausea. Pt recently had issues with constipation, took a colon cleanse and has had some relief. He last went to dialysis today.

## 2018-08-02 NOTE — ED Triage Notes (Signed)
Pt arrives via EMS with c/o abdominal pain and nausea,  162/100 en route.

## 2018-08-02 NOTE — ED Notes (Signed)
Pt called for room. No reply not seen in lobby.

## 2018-08-03 ENCOUNTER — Emergency Department (HOSPITAL_COMMUNITY): Payer: Medicare Other

## 2018-08-03 DIAGNOSIS — R1013 Epigastric pain: Secondary | ICD-10-CM | POA: Diagnosis not present

## 2018-08-03 DIAGNOSIS — K7460 Unspecified cirrhosis of liver: Secondary | ICD-10-CM | POA: Diagnosis not present

## 2018-08-03 MED ORDER — IOHEXOL 300 MG/ML  SOLN
100.0000 mL | Freq: Once | INTRAMUSCULAR | Status: AC | PRN
  Administered 2018-08-03: 100 mL via INTRAVENOUS

## 2018-08-03 NOTE — ED Notes (Signed)
Reviewed d/c instructions with pt, who verbalized understanding and had no outstanding questions. Pt departed in NAD, refused use of wheelchair.   

## 2018-08-03 NOTE — ED Notes (Signed)
Pt states that he no longer produces urine, so will be unable to provide sample for testing.

## 2018-08-03 NOTE — ED Provider Notes (Signed)
[facility] EMERGENCY DEPARTMENT Provider Note   CSN: [id number] Arrival date & time: 08/02/18  2135     History   Chief Complaint Chief Complaint  Patient presents with  . Abdominal Pain    HPI [name] is a 58 y.o. male.  Patient with ESRD on HD, last dialyzed yesterday, awaiting transplant, presents to the emergency department with a chief complaint of abdominal mass.  He states that he can feel a mass in his upper abdomen.  States that it is more noticeable when he lies down.  He states that it caused him discomfort.  States that he was seen recently and had a CT scan which showed large stool burden.  He states that since then he has had a "colon cleanse", but reports that the mass remains.  He denies any fever, chills, or vomiting.  He is very anxious that there is something seriously wrong.  No treatments tried prior to arrival.  The history is provided by the patient. No language interpreter was used.    Past Medical History:  Diagnosis Date  . Anemia   . Anxiety   . Arthritis    left shoulder  . Atherosclerosis of aorta ([place])   . Cardiomegaly   . Chest pain    DATE UNKNOWN, C/O PERIODICALLY  . Cocaine abuse ([place])   . COPD exacerbation ([place]) 08/17/2016  . Coronary artery disease    stent 02/22/17  . ESRD (end stage renal disease) on dialysis ([place])    "[name]; MWF" (07/04/2017)  . GERD (gastroesophageal reflux disease)    DATE UNKNOWN  . Hemorrhoids   . Hepatitis B, chronic ([place])   . Hepatitis C   . History of kidney stones   . Hyperkalemia   . Hypertension   . Kidney failure   . Metabolic bone disease    Patient denies  . Mitral stenosis   . Myocardial infarction ([place])   . Pneumonia   . Pulmonary edema   . Solitary rectal ulcer syndrome 07/2017   at flex sig for rectal bleeding  . Tubular adenoma of colon     Patient Active Problem List   Diagnosis Date Noted  . CAD (coronary artery disease) 03/11/2018  . Benign  neoplasm of cecum   . Benign neoplasm of ascending colon   . Benign neoplasm of descending colon   . Benign neoplasm of rectum   . Atrial fibrillation ([place]) 01/23/2018  . Hx of colonic polyps 01/20/2018  . ESRD (end stage renal disease) ([place]) 11/21/2017  . GERD (gastroesophageal reflux disease) 11/16/2017  . Liver cirrhosis ([place]) 11/15/2017  . DNR (do not resuscitate)   . Palliative care by specialist   . Hyponatremia 11/04/2017  . SBP (spontaneous bacterial peritonitis) ([place]) 10/30/2017  . Liver disease, chronic 10/30/2017  . SOB (shortness of breath)   . Abdominal pain 10/28/2017  . Upper airway cough syndrome with flattening on f/v loop 10/13/17 c/w vcd 10/17/2017  . Elevated diaphragm 10/13/2017  . Ileus ([place]) 09/29/2017  . Malnutrition of moderate degree 09/29/2017  . Sinus congestion 09/03/2017  . Symptomatic anemia 09/02/2017  . Other cirrhosis of liver ([place]) 09/02/2017  . Left bundle branch block 09/02/2017  . Mitral stenosis 09/02/2017  . Hematochezia 07/15/2017  . Wide-complex tachycardia ([place])   . Endotracheally intubated   . ESRD on dialysis ([place]) 07/04/2017  . Acute respiratory failure with hypoxia ([place]) 06/18/2017  . CKD (chronic kidney disease) stage V requiring chronic dialysis ([place]) 06/18/2017  .  History of Cocaine abuse ([place]) 06/18/2017  . Hypertension 06/18/2017  . Infection of AV graft for dialysis ([place]) 06/18/2017  . Anxiety 06/18/2017  . Anemia due to chronic kidney disease 06/18/2017  . Atrial flutter with rapid ventricular response ([place]) 06/18/2017  . Personality disorder ([place]) 06/13/2017  . Cellulitis 06/12/2017  . Adjustment disorder with mixed anxiety and depressed mood 06/10/2017  . Suicidal ideation 06/10/2017  . Arm wound, left, sequela 06/10/2017  . Dyspnea on exertion 05/29/2017  . Tachycardia 05/29/2017  . Hyperkalemia 05/22/2017  . Acute metabolic encephalopathy   . Anemia 04/23/2017  . Ascites 04/23/2017  . COPD (chronic obstructive  pulmonary disease) ([place]) 04/23/2017  . Acute on chronic respiratory failure with hypoxia ([place]) 03/25/2017  . Arrhythmia 03/25/2017  . COPD GOLD 0 with flattening on inps f/v  09/27/2016  . Essential hypertension 09/27/2016  . Fluid overload 08/30/2016  . COPD exacerbation ([place]) 08/17/2016  . Hypertensive urgency 08/17/2016  . Respiratory failure ([place]) 08/17/2016  . Problem with dialysis access ([place]) 07/23/2016  . Chronic hepatitis B ([place]) 03/05/2014  . Chronic hepatitis C without hepatic coma ([place]) 03/05/2014  . Internal hemorrhoids with bleeding, swelling and itching 03/05/2014  . Thrombocytopenia ([place]) 03/05/2014  . Chest pain 02/27/2014  . Alcohol abuse 04/14/2009  . Cigarette smoker 04/14/2009  . GANGLION CYST 04/14/2009    Past Surgical History:  Procedure Laterality Date  . A/V FISTULAGRAM Left 05/26/2017   Procedure: A/V FISTULAGRAM;  Surgeon: Conrad Joshua, MD;  Location: [place] [facility];  Service: Cardiovascular;  Laterality: Left;  . A/V FISTULAGRAM Right 11/18/2017   Procedure: A/V FISTULAGRAM - Right Arm;  Surgeon: Elam Dutch, MD;  Location: [place] [facility];  Service: Cardiovascular;  Laterality: Right;  . APPLICATION OF WOUND VAC Left 06/14/2017   Procedure: APPLICATION OF WOUND VAC;  Surgeon: Katha Cabal, MD;  Location: [facility] ORS;  Service: Vascular;  Laterality: Left;  . AV FISTULA PLACEMENT  2012   BELIEVED WAS PLACED IN JUNE  . AV FISTULA PLACEMENT Right 08/09/2017   Procedure: Creation Right arm ARTERIOVENOUS BRACHIOCEPOHALIC FISTULA;  Surgeon: Elam Dutch, MD;  Location: [facility] OR;  Service: Vascular;  Laterality: Right;  . AV FISTULA PLACEMENT Right 11/22/2017   Procedure: INSERTION OF ARTERIOVENOUS (AV) GORE-TEX GRAFT RIGHT UPPER ARM;  Surgeon: Elam Dutch, MD;  Location: [place];  Service: Vascular;  Laterality: Right;  . BIOPSY  01/25/2018   Procedure: BIOPSY;  Surgeon: Jerene Bears, MD;  Location: [facility];  Service:  Gastroenterology;;  . COLONOSCOPY    . COLONOSCOPY WITH PROPOFOL N/A 01/25/2018   Procedure: COLONOSCOPY WITH PROPOFOL;  Surgeon: Jerene Bears, MD;  Location: [place];  Service: Gastroenterology;  Laterality: N/A;  . CORONARY STENT INTERVENTION N/A 02/22/2017   Procedure: CORONARY STENT INTERVENTION;  Surgeon: Nigel Mormon, MD;  Location: [place] [facility];  Service: Cardiovascular;  Laterality: N/A;  . ESOPHAGOGASTRODUODENOSCOPY (EGD) WITH PROPOFOL N/A 01/25/2018   Procedure: ESOPHAGOGASTRODUODENOSCOPY (EGD) WITH PROPOFOL;  Surgeon: Jerene Bears, MD;  Location: [place];  Service: Gastroenterology;  Laterality: N/A;  . FLEXIBLE SIGMOIDOSCOPY N/A 07/15/2017   Procedure: FLEXIBLE SIGMOIDOSCOPY;  Surgeon: Carol Ada, MD;  Location: [place];  Service: Endoscopy;  Laterality: N/A;  . HEMORRHOID BANDING    . I&D EXTREMITY Left 06/01/2017   Procedure: IRRIGATION AND DEBRIDEMENT LEFT ARM HEMATOMA WITH LIGATION OF LEFT ARM AV FISTULA;  Surgeon: Elam Dutch, MD;  Location: [place];  Service: Vascular;  Laterality: Left;  .  I&D EXTREMITY Left 06/14/2017   Procedure: IRRIGATION AND DEBRIDEMENT EXTREMITY;  Surgeon: Katha Cabal, MD;  Location: [facility] ORS;  Service: Vascular;  Laterality: Left;  . INSERTION OF DIALYSIS CATHETER  05/30/2017  . INSERTION OF DIALYSIS CATHETER N/A 05/30/2017   Procedure: INSERTION OF DIALYSIS CATHETER;  Surgeon: Elam Dutch, MD;  Location: [place];  Service: Vascular;  Laterality: N/A;  . IR PARACENTESIS  08/30/2017  . IR PARACENTESIS  09/29/2017  . IR PARACENTESIS  10/28/2017  . IR PARACENTESIS  11/09/2017  . IR PARACENTESIS  11/16/2017  . IR PARACENTESIS  11/28/2017  . IR PARACENTESIS  12/01/2017  . IR PARACENTESIS  12/06/2017  . IR PARACENTESIS  01/03/2018  . IR PARACENTESIS  01/23/2018  . IR PARACENTESIS  02/07/2018  . IR PARACENTESIS  02/21/2018  . IR PARACENTESIS  03/06/2018  . IR PARACENTESIS  03/17/2018  . IR PARACENTESIS  04/04/2018  . IR  RADIOLOGIST EVAL & MGMT  02/14/2018  . LEFT HEART CATH AND CORONARY ANGIOGRAPHY N/A 02/22/2017   Procedure: LEFT HEART CATH AND CORONARY ANGIOGRAPHY;  Surgeon: Nigel Mormon, MD;  Location: [place] [facility];  Service: Cardiovascular;  Laterality: N/A;  . LIGATION OF ARTERIOVENOUS  FISTULA Left 08/14/7626   Procedure: Plication of Left Arm Arteriovenous Fistula;  Surgeon: Elam Dutch, MD;  Location: [place];  Service: Vascular;  Laterality: Left;  . POLYPECTOMY    . POLYPECTOMY  01/25/2018   Procedure: POLYPECTOMY;  Surgeon: Jerene Bears, MD;  Location: [place];  Service: Gastroenterology;;  . REVISON OF ARTERIOVENOUS FISTULA Left 09/24/1759   Procedure: PLICATION OF DISTAL ANEURYSMAL SEGEMENT OF LEFT UPPER ARM ARTERIOVENOUS FISTULA;  Surgeon: Elam Dutch, MD;  Location: [place];  Service: Vascular;  Laterality: Left;  . REVISON OF ARTERIOVENOUS FISTULA Left 12/16/3708   Procedure: Plication of Left Upper Arm Fistula ;  Surgeon: Waynetta Sandy, MD;  Location: [place];  Service: Vascular;  Laterality: Left;  . SKIN GRAFT SPLIT THICKNESS LEG / FOOT Left    SKIN GRAFT SPLIT THICKNESS LEFT ARM DONOR SITE: LEFT ANTERIOR THIGH  . SKIN SPLIT GRAFT Left 07/04/2017   Procedure: SKIN GRAFT SPLIT THICKNESS LEFT ARM DONOR SITE: LEFT ANTERIOR THIGH;  Surgeon: Elam Dutch, MD;  Location: [place];  Service: Vascular;  Laterality: Left;  . THROMBECTOMY W/ EMBOLECTOMY Left 06/05/2017   Procedure: EXPLORATION OF LEFT ARM FOR BLEEDING; OVERSEWED PROXIMAL FISTULA;  Surgeon: Angelia Mould, MD;  Location: [place];  Service: Vascular;  Laterality: Left;  . WOUND EXPLORATION Left 06/03/2017   Procedure: WOUND EXPLORATION WITH WOUND VAC APPLICATION TO LEFT ARM;  Surgeon: Angelia Mould, MD;  Location: [place];  Service: Vascular;  Laterality: Left;        Home Medications    Prior to Admission medications   Medication Sig Start Date End Date Taking? Authorizing Provider    albuterol (VENTOLIN HFA) 108 (90 Base) MCG/ACT inhaler Inhale 2 puffs into the lungs every 6 (six) hours as needed for wheezing or shortness of breath.   Yes [provider]  gabapentin (NEURONTIN) 100 MG capsule Take 100 mg by mouth 2 (two) times daily. 12/13/17  Yes [provider]  hydrALAZINE (APRESOLINE) 100 MG tablet Take 1 tablet (100 mg total) by mouth 2 (two) times daily as needed. Taking after HD on Dialysis days Patient taking differently: Take 100 mg by mouth 2 (two) times daily as needed (high blood pressure). If needed on Monday, Wednesday, Friday take after  dialysis 03/11/18  Yes Eulogio Bear U, DO  lactulose (CHRONULAC) 10 GM/15ML solution Take 45 mLs (30 g total) by mouth 2 (two) times daily. 03/11/18  Yes Eulogio Bear U, DO  metoprolol tartrate (LOPRESSOR) 25 MG tablet Take 1 tablet (25 mg total) by mouth 2 (two) times daily. 03/11/18  Yes Vann, Jessica U, DO  pantoprazole (PROTONIX) 40 MG tablet Take 1 tablet (40 mg total) by mouth daily at 12 noon. Patient taking differently: Take 40 mg by mouth See admin instructions. Take one tablet (40 mg) by mouth in the morning on Sunday, Tuesday, Thursday, Saturday, take one tablet (40 mg) after dialysis on Monday, Wednesday, Friday. May also take one tablet (40 mg) in the evening as needed for acid reflux. 03/12/18  Yes Geradine Girt, DO  sevelamer carbonate (RENVELA) 800 MG tablet Take 4 tablets with meals  And 2 tablets twice daily with snacks Patient taking differently: Take 2,400-3,200 mg by mouth See admin instructions. Take 3 - 4 tablets (2400 - 3200 mg) up to five times daily with meals or snacks 01/22/18  Yes Patrecia Pour, MD  traZODone (DESYREL) 50 MG tablet Take 50 mg by mouth at bedtime as needed for sleep.    Yes [provider]  aspirin 81 MG EC tablet Take 1 tablet (81 mg total) by mouth daily. Patient not taking: Reported on 07/22/2018 03/12/18   Geradine Girt, DO    Family History Family History   Problem Relation Age of Onset  . Heart disease Mother   . Lung cancer Mother   . Heart disease Father   . Malignant hyperthermia Father   . COPD Father   . Throat cancer Sister   . Esophageal cancer Sister   . Hypertension Other   . COPD Other   . Colon cancer Neg Hx   . Colon polyps Neg Hx   . Rectal cancer Neg Hx   . Stomach cancer Neg Hx     Social History Social History   Tobacco Use  . Smoking status: Current Every Day Smoker    Packs/day: 0.50    Years: 43.00    Pack years: 21.50    Types: Cigarettes    Start date: 08/13/1973  . Smokeless tobacco: Never Used  Substance Use Topics  . Alcohol use: Not Currently    Frequency: Never    Comment: quit drinking in 2017  . Drug use: Yes    Types: Marijuana    Comment: quit in 2017"     Allergies   Morphine and related; Aspirin; Clonidine derivatives; Tramadol; and Tylenol [acetaminophen]   Review of Systems Review of Systems  All other systems reviewed and are negative.    Physical Exam Updated Vital Signs BP (!) 174/85   Pulse 80   Temp 97.6 F (36.4 C) (Oral)   Resp 16   SpO2 98%   Physical Exam Vitals signs and nursing note reviewed.  Constitutional:      Appearance: He is well-developed.  HENT:     Head: Normocephalic and atraumatic.  Eyes:     General: No scleral icterus.       Right eye: No discharge.        Left eye: No discharge.     Conjunctiva/sclera: Conjunctivae normal.     Pupils: Pupils are equal, round, and reactive to light.  Neck:     Musculoskeletal: Normal range of motion and neck supple.     Vascular: No JVD.  Cardiovascular:  Rate and Rhythm: Normal rate and regular rhythm.     Heart sounds: Normal heart sounds. No murmur. No friction rub. No gallop.      Comments: No rub Pulmonary:     Effort: Pulmonary effort is normal. No respiratory distress.     Breath sounds: Normal breath sounds. No wheezing or rales.  Chest:     Chest wall: No tenderness.  Abdominal:      General: There is no distension.     Palpations: Abdomen is soft. There is mass.     Tenderness: There is no abdominal tenderness. There is no guarding or rebound.  Musculoskeletal: Normal range of motion.        General: No tenderness.  Skin:    General: Skin is warm and dry.  Neurological:     Mental Status: He is alert and oriented to person, place, and time.  Psychiatric:        Behavior: Behavior normal.        Thought Content: Thought content normal.        Judgment: Judgment normal.      ED Treatments / Results  Labs (all labs ordered are listed, but only abnormal results are displayed) Labs Reviewed  COMPREHENSIVE METABOLIC PANEL - Abnormal; Notable for the following components:      Result Value   Potassium 3.3 (*)    Chloride 93 (*)    Creatinine, Ser 4.85 (*)    Calcium 8.2 (*)    Alkaline Phosphatase 142 (*)    GFR calc non Af Amer 13 (*)    GFR calc Af Amer 14 (*)    All other components within normal limits  CBC - Abnormal; Notable for the following components:   RBC 3.66 (*)    Hemoglobin 9.7 (*)    HCT 28.9 (*)    MCV 79.0 (*)    Platelets 89 (*)    All other components within normal limits  LIPASE, BLOOD  URINALYSIS, ROUTINE W REFLEX MICROSCOPIC    EKG None  Radiology No results found.  Procedures Procedures (including critical care time)  Medications Ordered in ED Medications  sodium chloride flush (NS) 0.9 % injection 3 mL (3 mLs Intravenous Given 08/03/18 0159)  ondansetron (ZOFRAN-ODT) disintegrating tablet 4 mg (4 mg Oral Given 08/02/18 2147)     Initial Impression / Assessment and Plan / ED Course  I have reviewed the triage vital signs and the nursing notes.  Pertinent labs & imaging results that were available during my care of the patient were reviewed by me and considered in my medical decision making (see chart for details).     Patient with complaint of abdominal mass.  He states that the mass went away when I went into the  room to examine him.  Denies fever, nausea, or vomiting.  Will repeat CT today.  2:08 AM Labs from yesterday and significantly different from today's labs and all other prior labs.  Presumed lab error or incorrectly labeled sample from yesterday.  CT shows no neuro acute abnormality.  He does have an enlarged liver, when viewed on CT and compared with where he feels the mass, it is likely his liver which is palpating.  I have reassured him of this.  CT did show incidental finding of pericardial effusion.  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  It is noted that this effusion has been present in the past on prior echocardiogram.  He has no pulsus paradoxus, no pericardial friction rub,  no electrical alternans on EKG, and no evidence of tamponade.  He is not in any distress.  He does not want to stay in the emergency department for any further treatment or evaluation.  Patient seen by and discussed with Dr. Roxanne Mins.  Will send email to Dr. Joelyn Oms, who patient reports is his primary nephrologist.  Final Clinical Impressions(s) / ED Diagnoses   Final diagnoses:  Epigastric pain    ED Discharge Orders    None       Montine Circle, PA-C 40/98/11 [id number]    Delora Fuel, MD 82/95/62 [phone]

## 2018-08-03 NOTE — Discharge Instructions (Addendum)
You need to tell you kidney doctor that you had some fluid around your heart at your next dialysis appointment.  The mass that you can feel in your abdomen is your liver, which is enlarged due to cirrhosis.

## 2018-08-04 DIAGNOSIS — D509 Iron deficiency anemia, unspecified: Secondary | ICD-10-CM | POA: Diagnosis not present

## 2018-08-04 DIAGNOSIS — N186 End stage renal disease: Secondary | ICD-10-CM | POA: Diagnosis not present

## 2018-08-04 DIAGNOSIS — N2581 Secondary hyperparathyroidism of renal origin: Secondary | ICD-10-CM | POA: Diagnosis not present

## 2018-08-04 DIAGNOSIS — D631 Anemia in chronic kidney disease: Secondary | ICD-10-CM | POA: Diagnosis not present

## 2018-08-07 DIAGNOSIS — D631 Anemia in chronic kidney disease: Secondary | ICD-10-CM | POA: Diagnosis not present

## 2018-08-07 DIAGNOSIS — N2581 Secondary hyperparathyroidism of renal origin: Secondary | ICD-10-CM | POA: Diagnosis not present

## 2018-08-07 DIAGNOSIS — N186 End stage renal disease: Secondary | ICD-10-CM | POA: Diagnosis not present

## 2018-08-07 DIAGNOSIS — D509 Iron deficiency anemia, unspecified: Secondary | ICD-10-CM | POA: Diagnosis not present

## 2018-08-09 DIAGNOSIS — N186 End stage renal disease: Secondary | ICD-10-CM | POA: Diagnosis not present

## 2018-08-09 DIAGNOSIS — D631 Anemia in chronic kidney disease: Secondary | ICD-10-CM | POA: Diagnosis not present

## 2018-08-09 DIAGNOSIS — N2581 Secondary hyperparathyroidism of renal origin: Secondary | ICD-10-CM | POA: Diagnosis not present

## 2018-08-09 DIAGNOSIS — D509 Iron deficiency anemia, unspecified: Secondary | ICD-10-CM | POA: Diagnosis not present

## 2018-08-11 DIAGNOSIS — D631 Anemia in chronic kidney disease: Secondary | ICD-10-CM | POA: Diagnosis not present

## 2018-08-11 DIAGNOSIS — D509 Iron deficiency anemia, unspecified: Secondary | ICD-10-CM | POA: Diagnosis not present

## 2018-08-11 DIAGNOSIS — N186 End stage renal disease: Secondary | ICD-10-CM | POA: Diagnosis not present

## 2018-08-11 DIAGNOSIS — N2581 Secondary hyperparathyroidism of renal origin: Secondary | ICD-10-CM | POA: Diagnosis not present

## 2018-08-12 DIAGNOSIS — N186 End stage renal disease: Secondary | ICD-10-CM | POA: Diagnosis not present

## 2018-08-12 DIAGNOSIS — Z992 Dependence on renal dialysis: Secondary | ICD-10-CM | POA: Diagnosis not present

## 2018-08-12 DIAGNOSIS — I120 Hypertensive chronic kidney disease with stage 5 chronic kidney disease or end stage renal disease: Secondary | ICD-10-CM | POA: Diagnosis not present

## 2018-08-14 DIAGNOSIS — N186 End stage renal disease: Secondary | ICD-10-CM | POA: Diagnosis not present

## 2018-08-14 DIAGNOSIS — D509 Iron deficiency anemia, unspecified: Secondary | ICD-10-CM | POA: Diagnosis not present

## 2018-08-14 DIAGNOSIS — N2581 Secondary hyperparathyroidism of renal origin: Secondary | ICD-10-CM | POA: Diagnosis not present

## 2018-08-14 DIAGNOSIS — D631 Anemia in chronic kidney disease: Secondary | ICD-10-CM | POA: Diagnosis not present

## 2018-08-16 DIAGNOSIS — N186 End stage renal disease: Secondary | ICD-10-CM | POA: Diagnosis not present

## 2018-08-16 DIAGNOSIS — D509 Iron deficiency anemia, unspecified: Secondary | ICD-10-CM | POA: Diagnosis not present

## 2018-08-16 DIAGNOSIS — D631 Anemia in chronic kidney disease: Secondary | ICD-10-CM | POA: Diagnosis not present

## 2018-08-16 DIAGNOSIS — N2581 Secondary hyperparathyroidism of renal origin: Secondary | ICD-10-CM | POA: Diagnosis not present

## 2018-08-18 DIAGNOSIS — D509 Iron deficiency anemia, unspecified: Secondary | ICD-10-CM | POA: Diagnosis not present

## 2018-08-18 DIAGNOSIS — N186 End stage renal disease: Secondary | ICD-10-CM | POA: Diagnosis not present

## 2018-08-18 DIAGNOSIS — N2581 Secondary hyperparathyroidism of renal origin: Secondary | ICD-10-CM | POA: Diagnosis not present

## 2018-08-18 DIAGNOSIS — D631 Anemia in chronic kidney disease: Secondary | ICD-10-CM | POA: Diagnosis not present

## 2018-08-20 ENCOUNTER — Emergency Department (HOSPITAL_COMMUNITY): Payer: Medicare Other

## 2018-08-20 ENCOUNTER — Other Ambulatory Visit: Payer: Self-pay

## 2018-08-20 ENCOUNTER — Emergency Department (HOSPITAL_COMMUNITY)
Admission: EM | Admit: 2018-08-20 | Discharge: 2018-08-20 | Disposition: A | Discharge status: Home / Self Care | Payer: Medicare Other | Attending: Emergency Medicine | Admitting: Emergency Medicine

## 2018-08-20 DIAGNOSIS — J449 Chronic obstructive pulmonary disease, unspecified: Secondary | ICD-10-CM | POA: Insufficient documentation

## 2018-08-20 DIAGNOSIS — R1013 Epigastric pain: Secondary | ICD-10-CM | POA: Insufficient documentation

## 2018-08-20 DIAGNOSIS — N186 End stage renal disease: Secondary | ICD-10-CM | POA: Diagnosis not present

## 2018-08-20 DIAGNOSIS — F17210 Nicotine dependence, cigarettes, uncomplicated: Secondary | ICD-10-CM | POA: Insufficient documentation

## 2018-08-20 DIAGNOSIS — I2510 Atherosclerotic heart disease of native coronary artery without angina pectoris: Secondary | ICD-10-CM | POA: Insufficient documentation

## 2018-08-20 DIAGNOSIS — R1084 Generalized abdominal pain: Secondary | ICD-10-CM | POA: Diagnosis not present

## 2018-08-20 DIAGNOSIS — I120 Hypertensive chronic kidney disease with stage 5 chronic kidney disease or end stage renal disease: Secondary | ICD-10-CM | POA: Diagnosis not present

## 2018-08-20 DIAGNOSIS — R112 Nausea with vomiting, unspecified: Secondary | ICD-10-CM | POA: Diagnosis not present

## 2018-08-20 DIAGNOSIS — R1011 Right upper quadrant pain: Secondary | ICD-10-CM | POA: Diagnosis not present

## 2018-08-20 DIAGNOSIS — Z79899 Other long term (current) drug therapy: Secondary | ICD-10-CM | POA: Insufficient documentation

## 2018-08-20 DIAGNOSIS — I10 Essential (primary) hypertension: Secondary | ICD-10-CM | POA: Diagnosis not present

## 2018-08-20 LAB — CBC
HCT: 31.9 % — ABNORMAL LOW (ref 39.0–52.0)
Hemoglobin: 10.8 g/dL — ABNORMAL LOW (ref 13.0–17.0)
MCH: 26.3 pg (ref 26.0–34.0)
MCHC: 33.9 g/dL (ref 30.0–36.0)
MCV: 77.6 fL — ABNORMAL LOW (ref 80.0–100.0)
Platelets: 129 10*3/uL — ABNORMAL LOW (ref 150–400)
RBC: 4.11 MIL/uL — ABNORMAL LOW (ref 4.22–5.81)
RDW: 16 % — ABNORMAL HIGH (ref 11.5–15.5)
WBC: 9.3 10*3/uL (ref 4.0–10.5)
nRBC: 0 % (ref 0.0–0.2)

## 2018-08-20 LAB — COMPREHENSIVE METABOLIC PANEL
ALT: 16 U/L (ref 0–44)
AST: 32 U/L (ref 15–41)
Albumin: 4.3 g/dL (ref 3.5–5.0)
Alkaline Phosphatase: 112 U/L (ref 38–126)
Anion gap: 17 — ABNORMAL HIGH (ref 5–15)
BUN: 25 mg/dL — ABNORMAL HIGH (ref 6–20)
CO2: 27 mmol/L (ref 22–32)
Calcium: 9 mg/dL (ref 8.9–10.3)
Chloride: 86 mmol/L — ABNORMAL LOW (ref 98–111)
Creatinine, Ser: 8.9 mg/dL — ABNORMAL HIGH (ref 0.61–1.24)
GFR calc Af Amer: 7 mL/min — ABNORMAL LOW (ref >60)
GFR calc non Af Amer: 6 mL/min — ABNORMAL LOW (ref >60)
Glucose, Bld: 119 mg/dL — ABNORMAL HIGH (ref 70–99)
Potassium: 3.5 mmol/L (ref 3.5–5.1)
Sodium: 130 mmol/L — ABNORMAL LOW (ref 135–145)
Total Bilirubin: 1.1 mg/dL (ref 0.3–1.2)
Total Protein: 7.7 g/dL (ref 6.5–8.1)

## 2018-08-20 LAB — LIPASE, BLOOD: Lipase: 31 U/L (ref 11–51)

## 2018-08-20 MED ORDER — SODIUM CHLORIDE 0.9% FLUSH: 3.0000 mL | Freq: Once | INTRAVENOUS | Status: DC

## 2018-08-20 MED ORDER — OXYCODONE-ACETAMINOPHEN 5-325 MG PO TABS
1.0000 | ORAL_TABLET | Freq: Once | ORAL | Status: AC
  Administered 2018-08-20: 1 via ORAL
  Filled 2018-08-20: qty 1

## 2018-08-20 MED ORDER — ONDANSETRON 4 MG PO TBDP
4.0000 mg | ORAL_TABLET | Freq: Once | ORAL | Status: AC
  Administered 2018-08-20: 4 mg via ORAL
  Filled 2018-08-20: qty 1

## 2018-08-20 NOTE — ED Provider Notes (Signed)
I saw and evaluated the patient, reviewed the resident's note and I agree with the findings and plan.  EKG: None   58 year old male with history of end-stage renal disease and chronic abdominal pain secondary to ascites from hepatitis presents with abdominal discomfort.  He has had no fever or emesis.  On exam he has no peritoneal signs.  Offered paracentesis here which she has declined.  We will follow-up with his doctor   Lacretia Leigh, MD 08/20/18 1654

## 2018-08-20 NOTE — ED Notes (Signed)
Pt stated that he does not make urine. RN notified.

## 2018-08-20 NOTE — ED Notes (Signed)
Discharge instructions (including medications) discussed with and copy provided to patient/caregiver.  Pt verbalizes understanding of d/c instructions.  Armband removed.

## 2018-08-20 NOTE — ED Notes (Signed)
Pt refused IV  

## 2018-08-20 NOTE — ED Triage Notes (Signed)
Pt arrives via ems for c/o abd pain and nausea. Pt is N/v/di had dialysis last Friday was below  Dry weight, pt states dry heaving diaarrhea

## 2018-08-20 NOTE — ED Provider Notes (Signed)
[facility] EMERGENCY DEPARTMENT Provider Note   CSN: [id number] Arrival date & time: 08/20/18  1429     History   Chief Complaint Chief Complaint  Patient presents with  . Abdominal Pain    HPI [name] is a 58 y.o. male.  HPI  [name] is a 58 y.o. male with PMH of anemia, anxiety, arthritis, cardiomegaly, chest pain, COPD, cocaine abuse, CAD, hyperkalemia, HTN, mitral stenosis, pneumonia, solitary rectal ulcer syndrome, tubular adenoma of colon, ESRD on HD M/W/F who presents with abdominal pain and nausea with early satiety.  He states nothing is changed from his baseline in the last several days but he was frustrated by it so came to the ED today.  He last had dialysis on Friday and was slightly below his dry weight.  Has mild dyspnea at this time but it is at his baseline.  No chest pain or pressure.  Denies emesis and was able to eat a bowl of cereal this morning.  Says he is hungry now and would like to go eat spaghetti.  Feels that his abdomen is slightly distended but not more distended than it normally is.  Has some achy discomfort in the epigastric region but no pain elsewhere.  No fevers or chills.  No recent falls or trauma.  He is does not make urine.  No swelling in his legs.  No coffee-ground emesis in the last few days.  Loose stools but no frank diarrhea or melena or hematochezia.  No mucus in his stool.  Past Medical History:  Diagnosis Date  . Anemia   . Anxiety   . Arthritis    left shoulder  . Atherosclerosis of aorta ([place])   . Cardiomegaly   . Chest pain    DATE UNKNOWN, C/O PERIODICALLY  . Cocaine abuse ([place])   . COPD exacerbation ([place]) 08/17/2016  . Coronary artery disease    stent 02/22/17  . ESRD (end stage renal disease) on dialysis ([place])    "[name]; MWF" (07/04/2017)  . GERD (gastroesophageal reflux disease)    DATE UNKNOWN  . Hemorrhoids   . Hepatitis B, chronic ([place])   . Hepatitis C   . History  of kidney stones   . Hyperkalemia   . Hypertension   . Kidney failure   . Metabolic bone disease    Patient denies  . Mitral stenosis   . Myocardial infarction ([place])   . Pneumonia   . Pulmonary edema   . Solitary rectal ulcer syndrome 07/2017   at flex [facility] for rectal bleeding  . Tubular adenoma of colon     Patient Active Problem List   Diagnosis Date Noted  . CAD (coronary artery disease) 03/11/2018  . Benign neoplasm of cecum   . Benign neoplasm of ascending colon   . Benign neoplasm of descending colon   . Benign neoplasm of rectum   . Atrial fibrillation ([place]) 01/23/2018  . Hx of colonic polyps 01/20/2018  . ESRD (end stage renal disease) ([place]) 11/21/2017  . GERD (gastroesophageal reflux disease) 11/16/2017  . Liver cirrhosis ([place]) 11/15/2017  . DNR (do not resuscitate)   . Palliative care by specialist   . Hyponatremia 11/04/2017  . SBP (spontaneous bacterial peritonitis) ([place]) 10/30/2017  . Liver disease, chronic 10/30/2017  . SOB (shortness of breath)   . Abdominal pain 10/28/2017  . Upper airway cough syndrome with flattening on f/v loop 10/13/17 c/w vcd 10/17/2017  . Elevated diaphragm 10/13/2017  .  Ileus ([place]) 09/29/2017  . Malnutrition of moderate degree 09/29/2017  . Sinus congestion 09/03/2017  . Symptomatic anemia 09/02/2017  . Other cirrhosis of liver ([place]) 09/02/2017  . Left bundle branch block 09/02/2017  . Mitral stenosis 09/02/2017  . Hematochezia 07/15/2017  . Wide-complex tachycardia ([place])   . Endotracheally intubated   . ESRD on dialysis ([place]) 07/04/2017  . Acute respiratory failure with hypoxia ([place]) 06/18/2017  . CKD (chronic kidney disease) stage V requiring chronic dialysis ([place]) 06/18/2017  . History of Cocaine abuse ([place]) 06/18/2017  . Hypertension 06/18/2017  . Infection of AV graft for dialysis ([place]) 06/18/2017  . Anxiety 06/18/2017  . Anemia due to chronic kidney disease 06/18/2017  . Atrial flutter with rapid ventricular response  ([place]) 06/18/2017  . Personality disorder ([place]) 06/13/2017  . Cellulitis 06/12/2017  . Adjustment disorder with mixed anxiety and depressed mood 06/10/2017  . Suicidal ideation 06/10/2017  . Arm wound, left, sequela 06/10/2017  . Dyspnea on exertion 05/29/2017  . Tachycardia 05/29/2017  . Hyperkalemia 05/22/2017  . Acute metabolic encephalopathy   . Anemia 04/23/2017  . Ascites 04/23/2017  . COPD (chronic obstructive pulmonary disease) ([place]) 04/23/2017  . Acute on chronic respiratory failure with hypoxia ([place]) 03/25/2017  . Arrhythmia 03/25/2017  . COPD GOLD 0 with flattening on inps f/v  09/27/2016  . Essential hypertension 09/27/2016  . Fluid overload 08/30/2016  . COPD exacerbation ([place]) 08/17/2016  . Hypertensive urgency 08/17/2016  . Respiratory failure ([place]) 08/17/2016  . Problem with dialysis access ([place]) 07/23/2016  . Chronic hepatitis B ([place]) 03/05/2014  . Chronic hepatitis C without hepatic coma ([place]) 03/05/2014  . Internal hemorrhoids with bleeding, swelling and itching 03/05/2014  . Thrombocytopenia ([place]) 03/05/2014  . Chest pain 02/27/2014  . Alcohol abuse 04/14/2009  . Cigarette smoker 04/14/2009  . GANGLION CYST 04/14/2009    Past Surgical History:  Procedure Laterality Date  . A/V FISTULAGRAM Left 05/26/2017   Procedure: A/V FISTULAGRAM;  Surgeon: Conrad Flourtown, MD;  Location: [place] [facility];  Service: Cardiovascular;  Laterality: Left;  . A/V FISTULAGRAM Right 11/18/2017   Procedure: A/V FISTULAGRAM - Right Arm;  Surgeon: Elam Dutch, MD;  Location: [place] [facility];  Service: Cardiovascular;  Laterality: Right;  . APPLICATION OF WOUND VAC Left 06/14/2017   Procedure: APPLICATION OF WOUND VAC;  Surgeon: Katha Cabal, MD;  Location: [facility] ORS;  Service: Vascular;  Laterality: Left;  . AV FISTULA PLACEMENT  2012   BELIEVED WAS PLACED IN JUNE  . AV FISTULA PLACEMENT Right 08/09/2017   Procedure: Creation Right arm ARTERIOVENOUS BRACHIOCEPOHALIC  FISTULA;  Surgeon: Elam Dutch, MD;  Location: [facility] OR;  Service: Vascular;  Laterality: Right;  . AV FISTULA PLACEMENT Right 11/22/2017   Procedure: INSERTION OF ARTERIOVENOUS (AV) GORE-TEX GRAFT RIGHT UPPER ARM;  Surgeon: Elam Dutch, MD;  Location: [facility];  Service: Vascular;  Laterality: Right;  . BIOPSY  01/25/2018   Procedure: BIOPSY;  Surgeon: Jerene Bears, MD;  Location: [facility] ENDOSCOPY;  Service: Gastroenterology;;  . COLONOSCOPY    . COLONOSCOPY WITH PROPOFOL N/A 01/25/2018   Procedure: COLONOSCOPY WITH PROPOFOL;  Surgeon: Jerene Bears, MD;  Location: [place];  Service: Gastroenterology;  Laterality: N/A;  . CORONARY STENT INTERVENTION N/A 02/22/2017   Procedure: CORONARY STENT INTERVENTION;  Surgeon: Nigel Mormon, MD;  Location: [place] CV LAB;  Service: Cardiovascular;  Laterality: N/A;  . ESOPHAGOGASTRODUODENOSCOPY (EGD) WITH PROPOFOL N/A 01/25/2018   Procedure: ESOPHAGOGASTRODUODENOSCOPY (EGD) WITH PROPOFOL;  Surgeon: Jerene Bears, MD;  Location: [facility] ENDOSCOPY;  Service: Gastroenterology;  Laterality: N/A;  . FLEXIBLE SIGMOIDOSCOPY N/A 07/15/2017   Procedure: FLEXIBLE SIGMOIDOSCOPY;  Surgeon: Carol Ada, MD;  Location: [facility];  Service: Endoscopy;  Laterality: N/A;  . HEMORRHOID BANDING    . I&D EXTREMITY Left 06/01/2017   Procedure: IRRIGATION AND DEBRIDEMENT LEFT ARM HEMATOMA WITH LIGATION OF LEFT ARM AV FISTULA;  Surgeon: Elam Dutch, MD;  Location: [facility];  Service: Vascular;  Laterality: Left;  . I&D EXTREMITY Left 06/14/2017   Procedure: IRRIGATION AND DEBRIDEMENT EXTREMITY;  Surgeon: Katha Cabal, MD;  Location: [facility] ORS;  Service: Vascular;  Laterality: Left;  . INSERTION OF DIALYSIS CATHETER  05/30/2017  . INSERTION OF DIALYSIS CATHETER N/A 05/30/2017   Procedure: INSERTION OF DIALYSIS CATHETER;  Surgeon: Elam Dutch, MD;  Location: [place];  Service: Vascular;  Laterality: N/A;  . IR PARACENTESIS  08/30/2017  . IR PARACENTESIS   09/29/2017  . IR PARACENTESIS  10/28/2017  . IR PARACENTESIS  11/09/2017  . IR PARACENTESIS  11/16/2017  . IR PARACENTESIS  11/28/2017  . IR PARACENTESIS  12/01/2017  . IR PARACENTESIS  12/06/2017  . IR PARACENTESIS  01/03/2018  . IR PARACENTESIS  01/23/2018  . IR PARACENTESIS  02/07/2018  . IR PARACENTESIS  02/21/2018  . IR PARACENTESIS  03/06/2018  . IR PARACENTESIS  03/17/2018  . IR PARACENTESIS  04/04/2018  . IR RADIOLOGIST EVAL & MGMT  02/14/2018  . LEFT HEART CATH AND CORONARY ANGIOGRAPHY N/A 02/22/2017   Procedure: LEFT HEART CATH AND CORONARY ANGIOGRAPHY;  Surgeon: Nigel Mormon, MD;  Location: [place] CV LAB;  Service: Cardiovascular;  Laterality: N/A;  . LIGATION OF ARTERIOVENOUS  FISTULA Left 07/18/4942   Procedure: Plication of Left Arm Arteriovenous Fistula;  Surgeon: Elam Dutch, MD;  Location: [facility];  Service: Vascular;  Laterality: Left;  . POLYPECTOMY    . POLYPECTOMY  01/25/2018   Procedure: POLYPECTOMY;  Surgeon: Jerene Bears, MD;  Location: [facility];  Service: Gastroenterology;;  . REVISON OF ARTERIOVENOUS FISTULA Left 9/67/5916   Procedure: PLICATION OF DISTAL ANEURYSMAL SEGEMENT OF LEFT UPPER ARM ARTERIOVENOUS FISTULA;  Surgeon: Elam Dutch, MD;  Location: [place] [facility];  Service: Vascular;  Laterality: Left;  . REVISON OF ARTERIOVENOUS FISTULA Left 3/84/6659   Procedure: Plication of Left Upper Arm Fistula ;  Surgeon: Waynetta Sandy, MD;  Location: [facility];  Service: Vascular;  Laterality: Left;  . SKIN GRAFT SPLIT THICKNESS LEG / FOOT Left    SKIN GRAFT SPLIT THICKNESS LEFT ARM DONOR SITE: LEFT ANTERIOR THIGH  . SKIN SPLIT GRAFT Left 07/04/2017   Procedure: SKIN GRAFT SPLIT THICKNESS LEFT ARM DONOR SITE: LEFT ANTERIOR THIGH;  Surgeon: Elam Dutch, MD;  Location: [facility];  Service: Vascular;  Laterality: Left;  . THROMBECTOMY W/ EMBOLECTOMY Left 06/05/2017   Procedure: EXPLORATION OF LEFT ARM FOR BLEEDING; OVERSEWED PROXIMAL FISTULA;  Surgeon: Angelia Mould, MD;  Location: [place];  Service: Vascular;  Laterality: Left;  . WOUND EXPLORATION Left 06/03/2017   Procedure: WOUND EXPLORATION WITH WOUND VAC APPLICATION TO LEFT ARM;  Surgeon: Angelia Mould, MD;  Location: [place] [place];  Service: Vascular;  Laterality: Left;        Home Medications    Prior to Admission medications   Medication Sig Start Date End Date Taking? Authorizing Provider  albuterol (VENTOLIN HFA) 108 (90 Base) MCG/ACT inhaler Inhale 2 puffs into the lungs every 6 (six) hours  as needed for wheezing or shortness of breath.    [provider]  aspirin 81 MG EC tablet Take 1 tablet (81 mg total) by mouth daily. Patient not taking: Reported on 07/22/2018 03/12/18   Geradine Girt, DO  gabapentin (NEURONTIN) 100 MG capsule Take 100 mg by mouth 2 (two) times daily. 12/13/17   [provider]  hydrALAZINE (APRESOLINE) 100 MG tablet Take 1 tablet (100 mg total) by mouth 2 (two) times daily as needed. Taking after HD on Dialysis days Patient taking differently: Take 100 mg by mouth 2 (two) times daily as needed (high blood pressure). If needed on Monday, Wednesday, Friday take after dialysis 03/11/18   Geradine Girt, DO  lactulose (CHRONULAC) 10 GM/15ML solution Take 45 mLs (30 g total) by mouth 2 (two) times daily. 03/11/18   Geradine Girt, DO  metoprolol tartrate (LOPRESSOR) 25 MG tablet Take 1 tablet (25 mg total) by mouth 2 (two) times daily. 03/11/18   Geradine Girt, DO  pantoprazole (PROTONIX) 40 MG tablet Take 1 tablet (40 mg total) by mouth daily at 12 noon. Patient taking differently: Take 40 mg by mouth See admin instructions. Take one tablet (40 mg) by mouth in the morning on Sunday, Tuesday, Thursday, Saturday, take one tablet (40 mg) after dialysis on Monday, Wednesday, Friday. May also take one tablet (40 mg) in the evening as needed for acid reflux. 03/12/18   Geradine Girt, DO  sevelamer carbonate (RENVELA) 800 MG tablet Take 4 tablets with  meals  And 2 tablets twice daily with snacks Patient taking differently: Take 2,400-3,200 mg by mouth See admin instructions. Take 3 - 4 tablets (2400 - 3200 mg) up to five times daily with meals or snacks 01/22/18   Patrecia Pour, MD  traZODone (DESYREL) 50 MG tablet Take 50 mg by mouth at bedtime as needed for sleep.     [provider]    Family History Family History  Problem Relation Age of Onset  . Heart disease Mother   . Lung cancer Mother   . Heart disease Father   . Malignant hyperthermia Father   . COPD Father   . Throat cancer Sister   . Esophageal cancer Sister   . Hypertension Other   . COPD Other   . Colon cancer Neg Hx   . Colon polyps Neg Hx   . Rectal cancer Neg Hx   . Stomach cancer Neg Hx     Social History Social History   Tobacco Use  . Smoking status: Current Every Day Smoker    Packs/day: 0.50    Years: 43.00    Pack years: 21.50    Types: Cigarettes    Start date: 08/13/1973  . Smokeless tobacco: Never Used  Substance Use Topics  . Alcohol use: Not Currently    Frequency: Never    Comment: quit drinking in 2017  . Drug use: Yes    Types: Marijuana    Comment: quit in 2017"     Allergies   Morphine and related; Aspirin; Clonidine derivatives; Tramadol; and Tylenol [acetaminophen]   Review of Systems Review of Systems  Constitutional: Negative for activity change, appetite change, chills, fatigue and fever.  HENT: Negative for ear pain and sore throat.   Eyes: Negative for pain and visual disturbance.  Respiratory: Positive for shortness of breath. Negative for cough.   Cardiovascular: Negative for chest pain and palpitations.  Gastrointestinal: Positive for abdominal distention, abdominal pain and nausea. Negative  for vomiting.  Genitourinary: Negative for dysuria and hematuria.  Musculoskeletal: Negative for arthralgias and back pain.  Skin: Negative for color change and rash.  Neurological: Negative for seizures and syncope.    All other systems reviewed and are negative.    Physical Exam Updated Vital Signs BP (!) 166/77   Pulse 89   Temp 98.1 F (36.7 C) (Oral)   Resp 18   SpO2 100%   Physical Exam Vitals signs and nursing note reviewed.  Constitutional:      Appearance: He is well-developed. He is ill-appearing (chronically ill).  HENT:     Head: Normocephalic and atraumatic.  Eyes:     Conjunctiva/sclera: Conjunctivae normal.  Neck:     Musculoskeletal: Neck supple.  Cardiovascular:     Rate and Rhythm: Normal rate and regular rhythm.     Heart sounds: No murmur.  Pulmonary:     Effort: Pulmonary effort is normal. No respiratory distress.     Breath sounds: Normal breath sounds.  Abdominal:     General: There is distension (mild to moderate).     Palpations: Abdomen is soft.     Tenderness: There is abdominal tenderness in the right upper quadrant and epigastric area. There is no guarding or rebound. Negative signs include Murphy's sign, Rovsing's sign and McBurney's sign.     Hernia: No hernia is present.  Skin:    General: Skin is warm and dry.  Neurological:     Mental Status: He is alert.  Psychiatric:        Behavior: Behavior is cooperative.      ED Treatments / Results  Labs (all labs ordered are listed, but only abnormal results are displayed) Labs Reviewed  COMPREHENSIVE METABOLIC PANEL - Abnormal; Notable for the following components:      Result Value   Sodium 130 (*)    Chloride 86 (*)    Glucose, Bld 119 (*)    BUN 25 (*)    Creatinine, Ser 8.90 (*)    GFR calc non Af Amer 6 (*)    GFR calc Af Amer 7 (*)    Anion gap 17 (*)    All other components within normal limits  CBC - Abnormal; Notable for the following components:   RBC 4.11 (*)    Hemoglobin 10.8 (*)    HCT 31.9 (*)    MCV 77.6 (*)    RDW 16.0 (*)    Platelets 129 (*)    All other components within normal limits  LIPASE, BLOOD    EKG None  Radiology No results  found.  Procedures Procedures (including critical care time)  Medications Ordered in ED Medications  sodium chloride flush (NS) 0.9 % injection 3 mL (3 mLs Intravenous Not Given 08/20/18 1653)  oxyCODONE-acetaminophen (PERCOCET/ROXICET) 5-325 MG per tablet 1 tablet (has no administration in time range)  ondansetron (ZOFRAN-ODT) disintegrating tablet 4 mg (has no administration in time range)     Initial Impression / Assessment and Plan / ED Course  I have reviewed the triage vital signs and the nursing notes.  Pertinent labs & imaging results that were available during my care of the patient were reviewed by me and considered in my medical decision making (see chart for details).     MDM:  Imaging: None indicated at this time.    ED Provider Interpretation of EKG: None indicated at this time.    Labs: CBC with HGB 10.8 which is near baseline, CMP with Na 130 and  Cl 86, BUN 25 and Cr 8.9, K 3.5, AG 17. Lipase 31.    On initial evaluation, patient appears chronically ill but in no acute distress. Afebrile and hemodynamically stable. Alert and oriented x4, pleasant, and cooperative.  Patient presents with chronic abdominal pain with nausea and loose stool as detailed above.  On exam, patient has mild abdominal distention with tenderness only in the epigastric/right upper quadrant region.  No rebound or guarding.  No Murphy sign.  Doubt cholecystitis.  Bilirubin normal and no evidence for jaundice clinically.  Doubt cholangitis or choledocholithiasis.  Lipase normal.  Low suspicion for pancreatitis given his history this was still potentially on the differential.  Offered abdominal CT scan given his persistent symptoms but he has had 2 scans in the last 1 month which showed findings of cirrhosis and moderate ascites but no other acute pathology.  Offered additional CT scan today given his pain but he states if you do not know that the scan will help you find something to fix his pain he did  not want it.  He has pain only in the epigastric region and right upper quadrant without peritoneal signs or symptoms.  No pain beyond the areas of tenderness as detailed above.  He has no fever or leukocytosis.  Overall clinically, low suspicion for SBP at this time given his pain is at his chronic baseline and he came in simply with frustration with his chronic pain without other evidence for infection.  He also has had a history of SBP in April 2019 and states that this does not hurt nearly as bad as that.  He is on ciprofloxacin as prophylaxis for SBP as well and reports compliance.  He was offered paracentesis and stated he did not "want to be stuck".    His electrolytes are normal with exception of mild hyponatremia hypochloremia which is likely secondary to dialysis requirement although he is scheduled for tomorrow with no indication for emergent dialysis at this time.  He reports his dyspnea is mild and refused chest x-ray.    He appears comfortable in bed and is moving around without difficulty.  States he would like to be discharged so he can go home and eat hot spaghetti.  He does not want the same when she was offered in the ED because he would rather go eat food outside the hospital.  Given p.o. Percocet and Zofran ODT x1 in the ED for discomfort and nausea.  Discharged in stable condition with return precautions.  The plan for this patient was discussed with Dr. Zenia Resides who voiced agreement and who oversaw evaluation and treatment of this patient.   The patient was fully informed and involved with the history taking, evaluation, workup including labs/images, and plan. The patient's concerns and questions were addressed to the patient's satisfaction and he expressed agreement with the plan to DC home.    Final Clinical Impressions(s) / ED Diagnoses   Final diagnoses:  Abdominal pain, epigastric    ED Discharge Orders    None       Layton Naves, Rodena Goldmann, MD 08/20/18 1659     Lacretia Leigh, MD 08/21/18 1331

## 2018-08-21 ENCOUNTER — Emergency Department (HOSPITAL_COMMUNITY)
Admission: EM | Admit: 2018-08-21 | Discharge: 2018-08-21 | Disposition: A | Discharge status: Home / Self Care | Payer: Medicare Other | Attending: Emergency Medicine | Admitting: Emergency Medicine

## 2018-08-21 ENCOUNTER — Encounter (HOSPITAL_COMMUNITY): Payer: Self-pay | Admitting: Emergency Medicine

## 2018-08-21 ENCOUNTER — Other Ambulatory Visit: Payer: Self-pay

## 2018-08-21 DIAGNOSIS — J441 Chronic obstructive pulmonary disease with (acute) exacerbation: Secondary | ICD-10-CM | POA: Insufficient documentation

## 2018-08-21 DIAGNOSIS — R457 State of emotional shock and stress, unspecified: Secondary | ICD-10-CM | POA: Diagnosis not present

## 2018-08-21 DIAGNOSIS — I120 Hypertensive chronic kidney disease with stage 5 chronic kidney disease or end stage renal disease: Secondary | ICD-10-CM | POA: Insufficient documentation

## 2018-08-21 DIAGNOSIS — I10 Essential (primary) hypertension: Secondary | ICD-10-CM | POA: Diagnosis not present

## 2018-08-21 DIAGNOSIS — Z7982 Long term (current) use of aspirin: Secondary | ICD-10-CM | POA: Diagnosis not present

## 2018-08-21 DIAGNOSIS — Z79899 Other long term (current) drug therapy: Secondary | ICD-10-CM | POA: Diagnosis not present

## 2018-08-21 DIAGNOSIS — F17210 Nicotine dependence, cigarettes, uncomplicated: Secondary | ICD-10-CM | POA: Insufficient documentation

## 2018-08-21 DIAGNOSIS — N2581 Secondary hyperparathyroidism of renal origin: Secondary | ICD-10-CM | POA: Diagnosis not present

## 2018-08-21 DIAGNOSIS — D509 Iron deficiency anemia, unspecified: Secondary | ICD-10-CM | POA: Diagnosis not present

## 2018-08-21 DIAGNOSIS — F419 Anxiety disorder, unspecified: Secondary | ICD-10-CM | POA: Diagnosis not present

## 2018-08-21 DIAGNOSIS — N186 End stage renal disease: Secondary | ICD-10-CM | POA: Diagnosis not present

## 2018-08-21 DIAGNOSIS — R030 Elevated blood-pressure reading, without diagnosis of hypertension: Secondary | ICD-10-CM

## 2018-08-21 DIAGNOSIS — R4589 Other symptoms and signs involving emotional state: Secondary | ICD-10-CM

## 2018-08-21 DIAGNOSIS — D631 Anemia in chronic kidney disease: Secondary | ICD-10-CM | POA: Diagnosis not present

## 2018-08-21 IMAGING — DX DG CHEST 1V PORT
1 series · 1 of 1 positions shown · non-contrast
Comparison: 01/14/2017

CLINICAL DATA: Short of breath.  Dialysis patient

EXAM:
PORTABLE CHEST 1 VIEW

[chest ap]
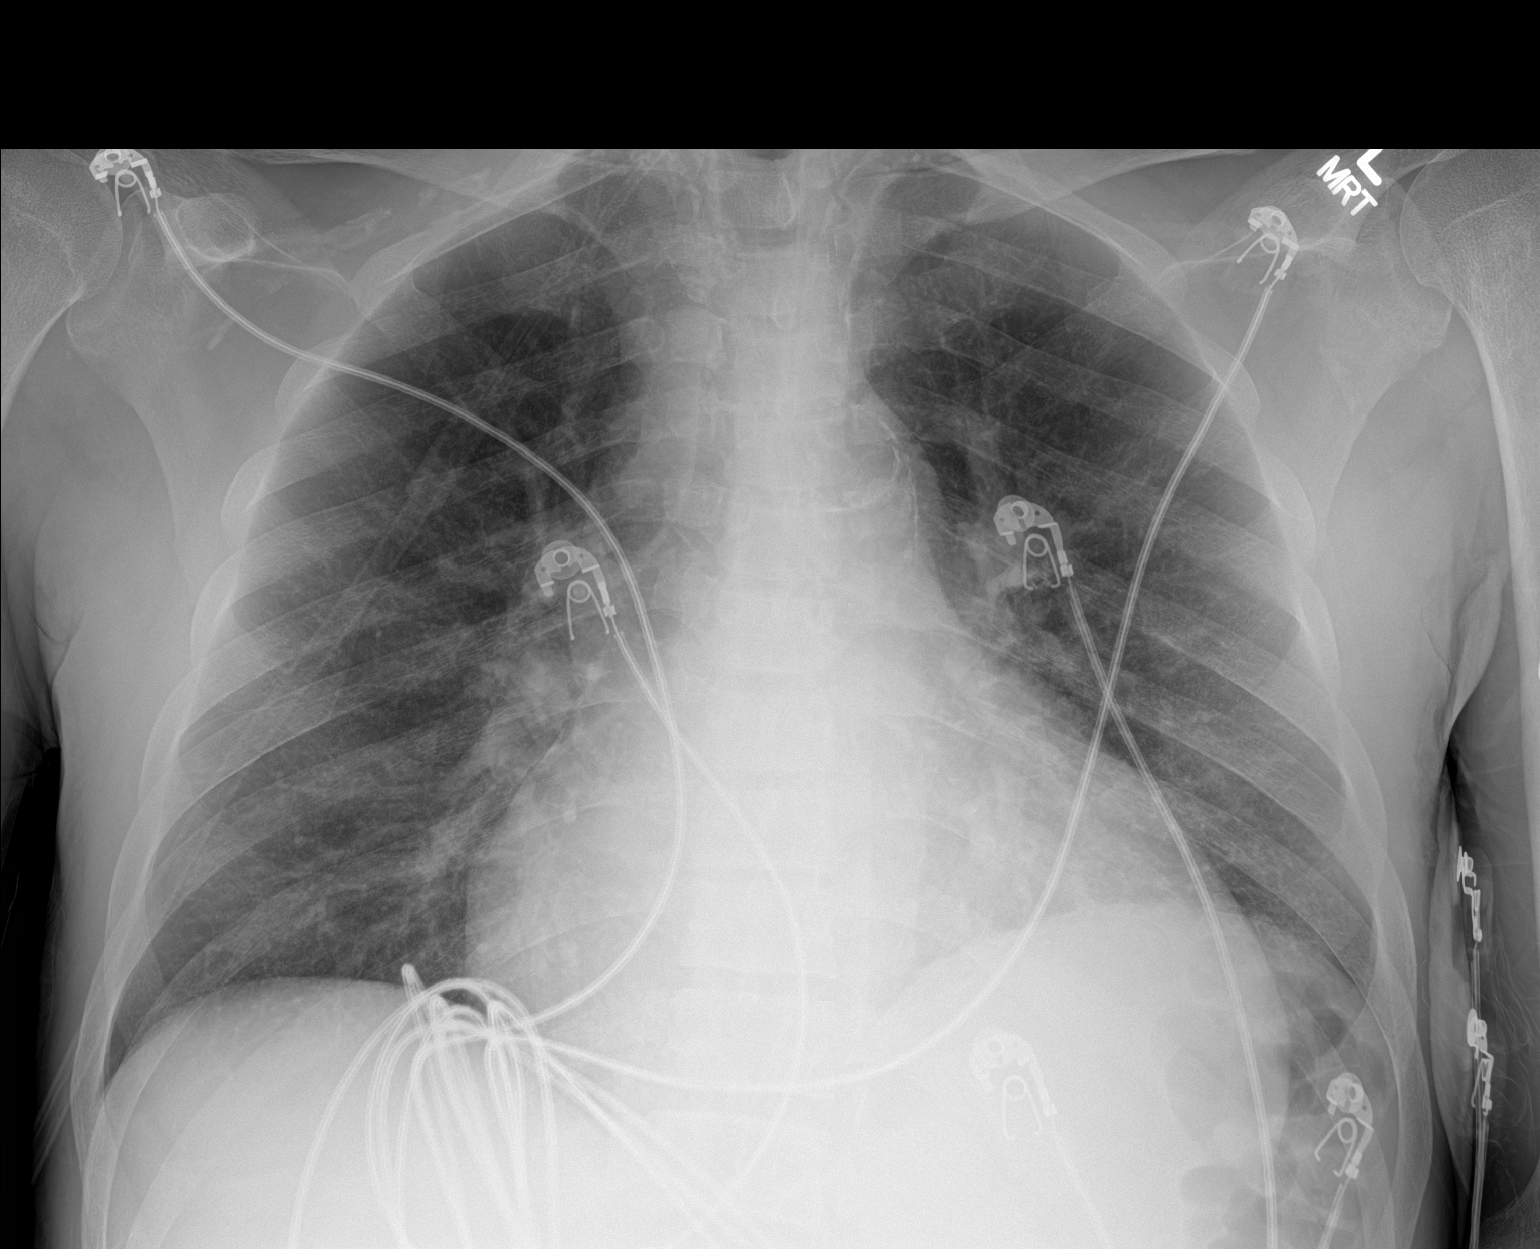

[1 of 1 positions shown; findings below may reference images not displayed]

FINDINGS: Cardiac enlargement with mild vascular congestion. Negative for
edema or effusion. Mild left lower lobe atelectasis unchanged.
Atherosclerotic aortic arch
IMPRESSION: Cardiac enlargement with mild vascular congestion. Negative for
edema

Mild left lower lobe atelectasis

## 2018-08-21 NOTE — Progress Notes (Signed)
CSW provided pt's RN with resources for outpatient mental health. CSW advised RN that pt is able to follow up with those resources for further anxiety needs.    There are no further CSW needs at this time. CSW will sign off.       Virgie Dad. Glennette Galster, MSW, [facility] Clinical Social Worker [phone]

## 2018-08-21 NOTE — ED Notes (Signed)
SW into see pt, taxie valcher coffee and bus passes gave pt

## 2018-08-21 NOTE — ED Notes (Signed)
Pt refused EKG PA aware , pt pacing states needs help with anxiety

## 2018-08-21 NOTE — Discharge Instructions (Addendum)
Please return to the emergency department immediately if you develop any shortness of breath, chest pain, feeling that you are going to pass out, or feeling that your heart is racing.  Please return to the emergency department immediately if you develop any thoughts of hurting herself or hurting anyone else.  As we discussed, please go to family services the [facility] on Thursday using the bus passes that Phineas Real found for you.

## 2018-08-21 NOTE — ED Notes (Addendum)
Pt refusing CBG checks and states he doesn't need anything but help with his anxiety. Pt also refusing further VS checks.

## 2018-08-21 NOTE — ED Triage Notes (Signed)
Pt was seen here yesterday for n/v and d/c , pt is M W F  Dialysis pt got 2 1/2 hours into dialysis and had anxiety attack and had to stop , pt arrives ambulatory via ems requesting sandwhich

## 2018-08-21 NOTE — ED Notes (Signed)
Pt given sandwhich as per his request and then vomited it

## 2018-08-21 NOTE — ED Notes (Addendum)
Pt refusing to keep vital sign monitoring on and is refusing EKG.

## 2018-08-21 NOTE — ED Notes (Addendum)
Pt aware that social work has been consulted, pt states he is leaving, refusing to sign AMA and refusing VS. Pt stable and ambulatory.

## 2018-08-21 NOTE — ED Notes (Signed)
Pt only allowing blood pressures to be taken on ankle.

## 2018-08-21 NOTE — ED Notes (Signed)
Pt refused d/c vitals saying  He was ready to go

## 2018-08-21 NOTE — ED Provider Notes (Signed)
[facility] EMERGENCY DEPARTMENT Provider Note   CSN: [id number] Arrival date & time: 08/21/18  1144     History   Chief Complaint Chief Complaint  Patient presents with  . Anxiety    HPI [name] is a 58 y.o. male.  HPI  Patient is a 58 year old male with a history of ESRD on dialysis Monday Wednesday Friday, CAD status post stent to RCA in 2018, MI presenting for "anxiety" and "panic attack".  Patient ports that he was receiving dialysis today when he felt that he "could not take it anymore". He denies feeling suicidal or ever having had suicidal ideations or attempts.  He denies any chest pain, palpitations, shortness of breath, dizziness, headedness, syncope or presyncope.  He denies any diagnosed history of panic attacks.  He reports that he has been feeling anxious for several months and tired of doing dialysis.  He reports that he will pick away at his nails well feeling anxious and he has had bleeding in all of his fingers as a result of this.  Patient reports that his primary care physician will "not give me anything for anxiety".  He denies ever seeing a Social worker.  He does have a history of MI with cardiac catheterization in 2018.  No history of PE.  Patient reports that upon presentation emergency department he is feeling physically well and states that "this is anxiety in my head".  Past Medical History:  Diagnosis Date  . Anemia   . Anxiety   . Arthritis    left shoulder  . Atherosclerosis of aorta ([place])   . Cardiomegaly   . Chest pain    DATE UNKNOWN, C/O PERIODICALLY  . Cocaine abuse ([place])   . COPD exacerbation ([place]) 08/17/2016  . Coronary artery disease    stent 02/22/17  . ESRD (end stage renal disease) on dialysis ([place])    "[name]; MWF" (07/04/2017)  . GERD (gastroesophageal reflux disease)    DATE UNKNOWN  . Hemorrhoids   . Hepatitis B, chronic ([place])   . Hepatitis C   . History of kidney stones   . Hyperkalemia   .  Hypertension   . Kidney failure   . Metabolic bone disease    Patient denies  . Mitral stenosis   . Myocardial infarction ([place])   . Pneumonia   . Pulmonary edema   . Solitary rectal ulcer syndrome 07/2017   at flex sig for rectal bleeding  . Tubular adenoma of colon     Patient Active Problem List   Diagnosis Date Noted  . CAD (coronary artery disease) 03/11/2018  . Benign neoplasm of cecum   . Benign neoplasm of ascending colon   . Benign neoplasm of descending colon   . Benign neoplasm of rectum   . Atrial fibrillation ([place]) 01/23/2018  . Hx of colonic polyps 01/20/2018  . ESRD (end stage renal disease) ([place]) 11/21/2017  . GERD (gastroesophageal reflux disease) 11/16/2017  . Liver cirrhosis ([place]) 11/15/2017  . DNR (do not resuscitate)   . Palliative care by specialist   . Hyponatremia 11/04/2017  . SBP (spontaneous bacterial peritonitis) ([place]) 10/30/2017  . Liver disease, chronic 10/30/2017  . SOB (shortness of breath)   . Abdominal pain 10/28/2017  . Upper airway cough syndrome with flattening on f/v loop 10/13/17 c/w vcd 10/17/2017  . Elevated diaphragm 10/13/2017  . Ileus ([place]) 09/29/2017  . Malnutrition of moderate degree 09/29/2017  . Sinus congestion 09/03/2017  . Symptomatic anemia 09/02/2017  .  Other cirrhosis of liver ([place]) 09/02/2017  . Left bundle branch block 09/02/2017  . Mitral stenosis 09/02/2017  . Hematochezia 07/15/2017  . Wide-complex tachycardia ([place])   . Endotracheally intubated   . ESRD on dialysis ([place]) 07/04/2017  . Acute respiratory failure with hypoxia ([place]) 06/18/2017  . CKD (chronic kidney disease) stage V requiring chronic dialysis ([place]) 06/18/2017  . History of Cocaine abuse ([place]) 06/18/2017  . Hypertension 06/18/2017  . Infection of AV graft for dialysis ([place]) 06/18/2017  . Anxiety 06/18/2017  . Anemia due to chronic kidney disease 06/18/2017  . Atrial flutter with rapid ventricular response ([place]) 06/18/2017  . Personality disorder  ([place]) 06/13/2017  . Cellulitis 06/12/2017  . Adjustment disorder with mixed anxiety and depressed mood 06/10/2017  . Suicidal ideation 06/10/2017  . Arm wound, left, sequela 06/10/2017  . Dyspnea on exertion 05/29/2017  . Tachycardia 05/29/2017  . Hyperkalemia 05/22/2017  . Acute metabolic encephalopathy   . Anemia 04/23/2017  . Ascites 04/23/2017  . COPD (chronic obstructive pulmonary disease) ([place]) 04/23/2017  . Acute on chronic respiratory failure with hypoxia ([place]) 03/25/2017  . Arrhythmia 03/25/2017  . COPD GOLD 0 with flattening on inps f/v  09/27/2016  . Essential hypertension 09/27/2016  . Fluid overload 08/30/2016  . COPD exacerbation ([place]) 08/17/2016  . Hypertensive urgency 08/17/2016  . Respiratory failure ([place]) 08/17/2016  . Problem with dialysis access ([place]) 07/23/2016  . Chronic hepatitis B ([place]) 03/05/2014  . Chronic hepatitis C without hepatic coma ([place]) 03/05/2014  . Internal hemorrhoids with bleeding, swelling and itching 03/05/2014  . Thrombocytopenia ([place]) 03/05/2014  . Chest pain 02/27/2014  . Alcohol abuse 04/14/2009  . Cigarette smoker 04/14/2009  . GANGLION CYST 04/14/2009    Past Surgical History:  Procedure Laterality Date  . A/V FISTULAGRAM Left 05/26/2017   Procedure: A/V FISTULAGRAM;  Surgeon: Conrad [facility], MD;  Location: [facility];  Service: Cardiovascular;  Laterality: Left;  . A/V FISTULAGRAM Right 11/18/2017   Procedure: A/V FISTULAGRAM - Right Arm;  Surgeon: Elam Dutch, MD;  Location: [facility];  Service: Cardiovascular;  Laterality: Right;  . APPLICATION OF WOUND VAC Left 06/14/2017   Procedure: APPLICATION OF WOUND VAC;  Surgeon: Katha Cabal, MD;  Location: [facility];  Service: Vascular;  Laterality: Left;  . AV FISTULA PLACEMENT  2012   BELIEVED WAS PLACED IN JUNE  . AV FISTULA PLACEMENT Right 08/09/2017   Procedure: Creation Right arm ARTERIOVENOUS BRACHIOCEPOHALIC FISTULA;  Surgeon: Elam Dutch, MD;   Location: [facility] OR;  Service: Vascular;  Laterality: Right;  . AV FISTULA PLACEMENT Right 11/22/2017   Procedure: INSERTION OF ARTERIOVENOUS (AV) GORE-TEX GRAFT RIGHT UPPER ARM;  Surgeon: Elam Dutch, MD;  Location: [facility];  Service: Vascular;  Laterality: Right;  . BIOPSY  01/25/2018   Procedure: BIOPSY;  Surgeon: Jerene Bears, MD;  Location: [facility] END[facility]COPY;  Service: Gastroenterology;;  . COLONOSCOPY    . COLONOSCOPY WITH PROPOFOL N/A 01/25/2018   Procedure: COLONOSCOPY WITH PROPOFOL;  Surgeon: Jerene Bears, MD;  Location: [facility];  Service: Gastroenterology;  Laterality: N/A;  . CORONARY STENT INTERVENTION N/A 02/22/2017   Procedure: CORONARY STENT INTERVENTION;  Surgeon: Nigel Mormon, MD;  Location: [facility] CV LAB;  Service: Cardiovascular;  Laterality: N/A;  . ESOPHAGOGASTRODUODENOSCOPY (EGD) WITH PROPOFOL N/A 01/25/2018   Procedure: ESOPHAGOGASTRODUODENOSCOPY (EGD) WITH PROPOFOL;  Surgeon: Jerene Bears, MD;  Location: [facility];  Service: Gastroenterology;  Laterality: N/A;  . FLEXIBLE SIGMOIDOSCOPY N/A 07/15/2017  Procedure: FLEXIBLE SIGMOIDOSCOPY;  Surgeon: Carol Ada, MD;  Location: [facility];  Service: Endoscopy;  Laterality: N/A;  . HEMORRHOID BANDING    . I&D EXTREMITY Left 06/01/2017   Procedure: IRRIGATION AND DEBRIDEMENT LEFT ARM HEMATOMA WITH LIGATION OF LEFT ARM AV FISTULA;  Surgeon: Elam Dutch, MD;  Location: [facility];  Service: Vascular;  Laterality: Left;  . I&D EXTREMITY Left 06/14/2017   Procedure: IRRIGATION AND DEBRIDEMENT EXTREMITY;  Surgeon: Katha Cabal, MD;  Location: [facility];  Service: Vascular;  Laterality: Left;  . INSERTION OF DIALYSIS CATHETER  05/30/2017  . INSERTION OF DIALYSIS CATHETER N/A 05/30/2017   Procedure: INSERTION OF DIALYSIS CATHETER;  Surgeon: Elam Dutch, MD;  Location: [facility];  Service: Vascular;  Laterality: N/A;  . IR PARACENTESIS  08/30/2017  . IR PARACENTESIS  09/29/2017  . IR PARACENTESIS  10/28/2017  .  IR PARACENTESIS  11/09/2017  . IR PARACENTESIS  11/16/2017  . IR PARACENTESIS  11/28/2017  . IR PARACENTESIS  12/01/2017  . IR PARACENTESIS  12/06/2017  . IR PARACENTESIS  01/03/2018  . IR PARACENTESIS  01/23/2018  . IR PARACENTESIS  02/07/2018  . IR PARACENTESIS  02/21/2018  . IR PARACENTESIS  03/06/2018  . IR PARACENTESIS  03/17/2018  . IR PARACENTESIS  04/04/2018  . IR RADIOLOGIST EVAL & MGMT  02/14/2018  . LEFT HEART CATH AND CORONARY ANGIOGRAPHY N/A 02/22/2017   Procedure: LEFT HEART CATH AND CORONARY ANGIOGRAPHY;  Surgeon: Nigel Mormon, MD;  Location: [facility];  Service: Cardiovascular;  Laterality: N/A;  . LIGATION OF ARTERIOVENOUS  FISTULA Left 3/0/8657   Procedure: Plication of Left Arm Arteriovenous Fistula;  Surgeon: Elam Dutch, MD;  Location: [facility];  Service: Vascular;  Laterality: Left;  . POLYPECTOMY    . POLYPECTOMY  01/25/2018   Procedure: POLYPECTOMY;  Surgeon: Jerene Bears, MD;  Location: [facility];  Service: Gastroenterology;;  . REVISON OF ARTERIOVENOUS FISTULA Left 8/46/9629   Procedure: PLICATION OF DISTAL ANEURYSMAL SEGEMENT OF LEFT UPPER ARM ARTERIOVENOUS FISTULA;  Surgeon: Elam Dutch, MD;  Location: [facility];  Service: Vascular;  Laterality: Left;  . REVISON OF ARTERIOVENOUS FISTULA Left 12/07/4130   Procedure: Plication of Left Upper Arm Fistula ;  Surgeon: Waynetta Sandy, MD;  Location: [facility];  Service: Vascular;  Laterality: Left;  . SKIN GRAFT SPLIT THICKNESS LEG / FOOT Left    SKIN GRAFT SPLIT THICKNESS LEFT ARM DONOR SITE: LEFT ANTERIOR THIGH  . SKIN SPLIT GRAFT Left 07/04/2017   Procedure: SKIN GRAFT SPLIT THICKNESS LEFT ARM DONOR SITE: LEFT ANTERIOR THIGH;  Surgeon: Elam Dutch, MD;  Location: [facility];  Service: Vascular;  Laterality: Left;  . THROMBECTOMY W/ EMBOLECTOMY Left 06/05/2017   Procedure: EXPLORATION OF LEFT ARM FOR BLEEDING; OVERSEWED PROXIMAL FISTULA;  Surgeon: Angelia Mould, MD;  Location: [facility];  Service:  Vascular;  Laterality: Left;  . WOUND EXPLORATION Left 06/03/2017   Procedure: WOUND EXPLORATION WITH WOUND VAC APPLICATION TO LEFT ARM;  Surgeon: Angelia Mould, MD;  Location: [facility];  Service: Vascular;  Laterality: Left;        Home Medications    Prior to Admission medications   Medication Sig Start Date End Date Taking? Authorizing Provider  albuterol (VENTOLIN HFA) 108 (90 Base) MCG/ACT inhaler Inhale 2 puffs into the lungs every 6 (six) hours as needed for wheezing or shortness of breath.    [provider]  aspirin 81 MG EC tablet Take 1 tablet (  81 mg total) by mouth daily. Patient not taking: Reported on 07/22/2018 03/12/18   Geradine Girt, DO  gabapentin (NEURONTIN) 100 MG capsule Take 100 mg by mouth 2 (two) times daily. 12/13/17   [provider]  hydrALAZINE (APRESOLINE) 100 MG tablet Take 1 tablet (100 mg total) by mouth 2 (two) times daily as needed. Taking after HD on Dialysis days Patient taking differently: Take 100 mg by mouth 2 (two) times daily as needed (high blood pressure). If needed on Monday, Wednesday, Friday take after dialysis 03/11/18   Geradine Girt, DO  lactulose (CHRONULAC) 10 GM/15ML solution Take 45 mLs (30 g total) by mouth 2 (two) times daily. 03/11/18   Geradine Girt, DO  metoprolol tartrate (LOPRESSOR) 25 MG tablet Take 1 tablet (25 mg total) by mouth 2 (two) times daily. 03/11/18   Geradine Girt, DO  pantoprazole (PROTONIX) 40 MG tablet Take 1 tablet (40 mg total) by mouth daily at 12 noon. Patient taking differently: Take 40 mg by mouth See admin instructions. Take one tablet (40 mg) by mouth in the morning on Sunday, Tuesday, Thursday, Saturday, take one tablet (40 mg) after dialysis on Monday, Wednesday, Friday. May also take one tablet (40 mg) in the evening as needed for acid reflux. 03/12/18   Geradine Girt, DO  sevelamer carbonate (RENVELA) 800 MG tablet Take 4 tablets with meals  And 2 tablets twice daily with  snacks Patient taking differently: Take 2,400-3,200 mg by mouth See admin instructions. Take 3 - 4 tablets (2400 - 3200 mg) up to five times daily with meals or snacks 01/22/18   Patrecia Pour, MD  traZODone (DESYREL) 50 MG tablet Take 50 mg by mouth at bedtime as needed for sleep.     [provider]    Family History Family History  Problem Relation Age of Onset  . Heart disease Mother   . Lung cancer Mother   . Heart disease Father   . Malignant hyperthermia Father   . COPD Father   . Throat cancer Sister   . Esophageal cancer Sister   . Hypertension Other   . COPD Other   . Colon cancer Neg Hx   . Colon polyps Neg Hx   . Rectal cancer Neg Hx   . Stomach cancer Neg Hx     Social History Social History   Tobacco Use  . Smoking status: Current Every Day Smoker    Packs/day: 0.50    Years: 43.00    Pack years: 21.50    Types: Cigarettes    Start date: 08/13/1973  . Smokeless tobacco: Never Used  Substance Use Topics  . Alcohol use: Not Currently    Frequency: Never    Comment: quit drinking in 2017  . Drug use: Yes    Types: Marijuana    Comment: quit in 2017"     Allergies   Morphine and related; Aspirin; Clonidine derivatives; Tramadol; and Tylenol [acetaminophen]   Review of Systems Review of Systems  Constitutional: Negative for chills and fever.  HENT: Negative for congestion and sore throat.   Respiratory: Negative for cough, chest tightness and shortness of breath.   Cardiovascular: Negative for chest pain, palpitations and leg swelling.  Gastrointestinal: Negative for abdominal pain, nausea and vomiting.  Genitourinary: Negative for dysuria and flank pain.  Musculoskeletal: Negative for back pain and myalgias.  Skin: Negative for rash.  Neurological: Negative for dizziness, syncope and light-headedness.  Psychiatric/Behavioral: Positive for agitation. Negative for  confusion, dysphoric mood, hallucinations and suicidal ideas. The patient is  nervous/anxious.      Physical Exam Updated Vital Signs BP (!) 168/110 (BP Location: Right Leg)   Pulse (!) 102   Temp 98.3 F (36.8 C)   Resp 20   SpO2 100%   Physical Exam Vitals signs and nursing note reviewed.  Constitutional:      General: He is not in acute distress.    Appearance: He is well-developed.     Comments: Chronically ill appearing.  Pacing during examination.   HENT:     Head: Normocephalic and atraumatic.  Eyes:     Conjunctiva/sclera: Conjunctivae normal.     Pupils: Pupils are equal, round, and reactive to light.  Neck:     Musculoskeletal: Normal range of motion and neck supple.  Cardiovascular:     Rate and Rhythm: Normal rate and regular rhythm.     Heart sounds: S1 normal and S2 normal. No murmur.  Pulmonary:     Effort: Pulmonary effort is normal.     Breath sounds: Normal breath sounds. No wheezing or rales.  Abdominal:     General: There is no distension.     Palpations: Abdomen is soft.  Musculoskeletal: Normal range of motion.        General: No deformity.  Lymphadenopathy:     Cervical: No cervical adenopathy.  Skin:    General: Skin is warm and dry.     Findings: No erythema or rash.     Comments: Old, well-healed skin grafting on left upper arm.  Fistula in place in right arm. Patient has shortened nails and bleeding from nailbeds in multiple fingers of bilateral hands.  No lacerations.  Neurological:     Mental Status: He is alert.     Comments: Cranial nerves grossly intact. Patient moves extremities symmetrically and with good coordination. Normal and symmetric gait.  Psychiatric:     Comments: Patient is alert and oriented x4.  Normal speech.  Appropriate affect.  Not responding to internal stimuli.  Thought content and judgment are normal.  No suicidal ideations expressed.      ED Treatments / Results  Labs (all labs ordered are listed, but only abnormal results are displayed) Labs Reviewed  CBG MONITORING, ED     EKG None  Radiology No results found.  Procedures Procedures (including critical care time)  Medications Ordered in ED Medications - No data to display   Initial Impression / Assessment and Plan / ED Course  I have reviewed the triage vital signs and the nursing notes.  Pertinent labs & imaging results that were available during my care of the patient were reviewed by me and considered in my medical decision making (see chart for details).  Clinical Course as of Aug 21 1517  Mon Aug 22, 4619  2541 58 year old male here from dialysis by ambulance after complaining of feeling anxious and needed to stop dialysis.  He was here yesterday for acute on chronic abdominal pain and had a full set of labs.  He is nontoxic-appearing.  The patient is unwilling to stay around to get any testing.  His initial request on arrival was a sandwich.   [MB]    Clinical Course User Index [MB] Hayden Rasmussen, MD    Patient is nontoxic-appearing, hemodynamically stable, and in no acute distress on presentation the emergency department.  I discussed with the patient that since he has never had a panic attack before, I recommend medical work-up, as  sensations of anxiety can be a presentation of cardiopulmonary or other disease.  He is very adamant that this is "in my head", and is denying any physical symptoms.  He is declining any medical work-up.  Recommended minimum EKG and CBG, which he refused.  He states that he wants resources for anxiety.  Patient is of appropriate decision-making capacity to be making decisions about his medical care, and was in full understanding that without further medical work-up, cannot make anxiety as a diagnosis of exclusion.  Patient ambulated without difficulty in emergency department.  He was tolerating p.o.  Social work and this Probation officer counseled patient at bedside about going to walk-in hours at family services of the [place].  Patient does have firearms in the home,  but states that "if I wanted to kill myself I would have done this a long time ago".  He states that he does not have the desire to harm himself or others.  No AVH.  Patient given resources for outpatient counseling and behavioral health, as well as bus passes to get there.  He was encouraged to return immediately if he developed any thoughts of self-harm, chest pain, shortness recommendations, lightheadedness, syncope or presyncope.  Patient is in understanding and agrees with the plan of care.  This is a supervised visit with Dr. Aletta Edouard. Evaluation, management, and discharge planning discussed with this attending physician.  Final Clinical Impressions(s) / ED Diagnoses   Final diagnoses:  Feeling anxious  Elevated blood pressure reading    ED Discharge Orders    None       Tamala Julian 08/21/18 1528    Hayden Rasmussen, MD 08/21/18 1747

## 2018-08-22 IMAGING — DX DG CHEST 1V PORT
1 series · 1 of 1 positions shown · non-contrast
Comparison: 02/19/2017

CLINICAL DATA: Shortness of Breath

EXAM:
PORTABLE CHEST 1 VIEW

[chest ap]
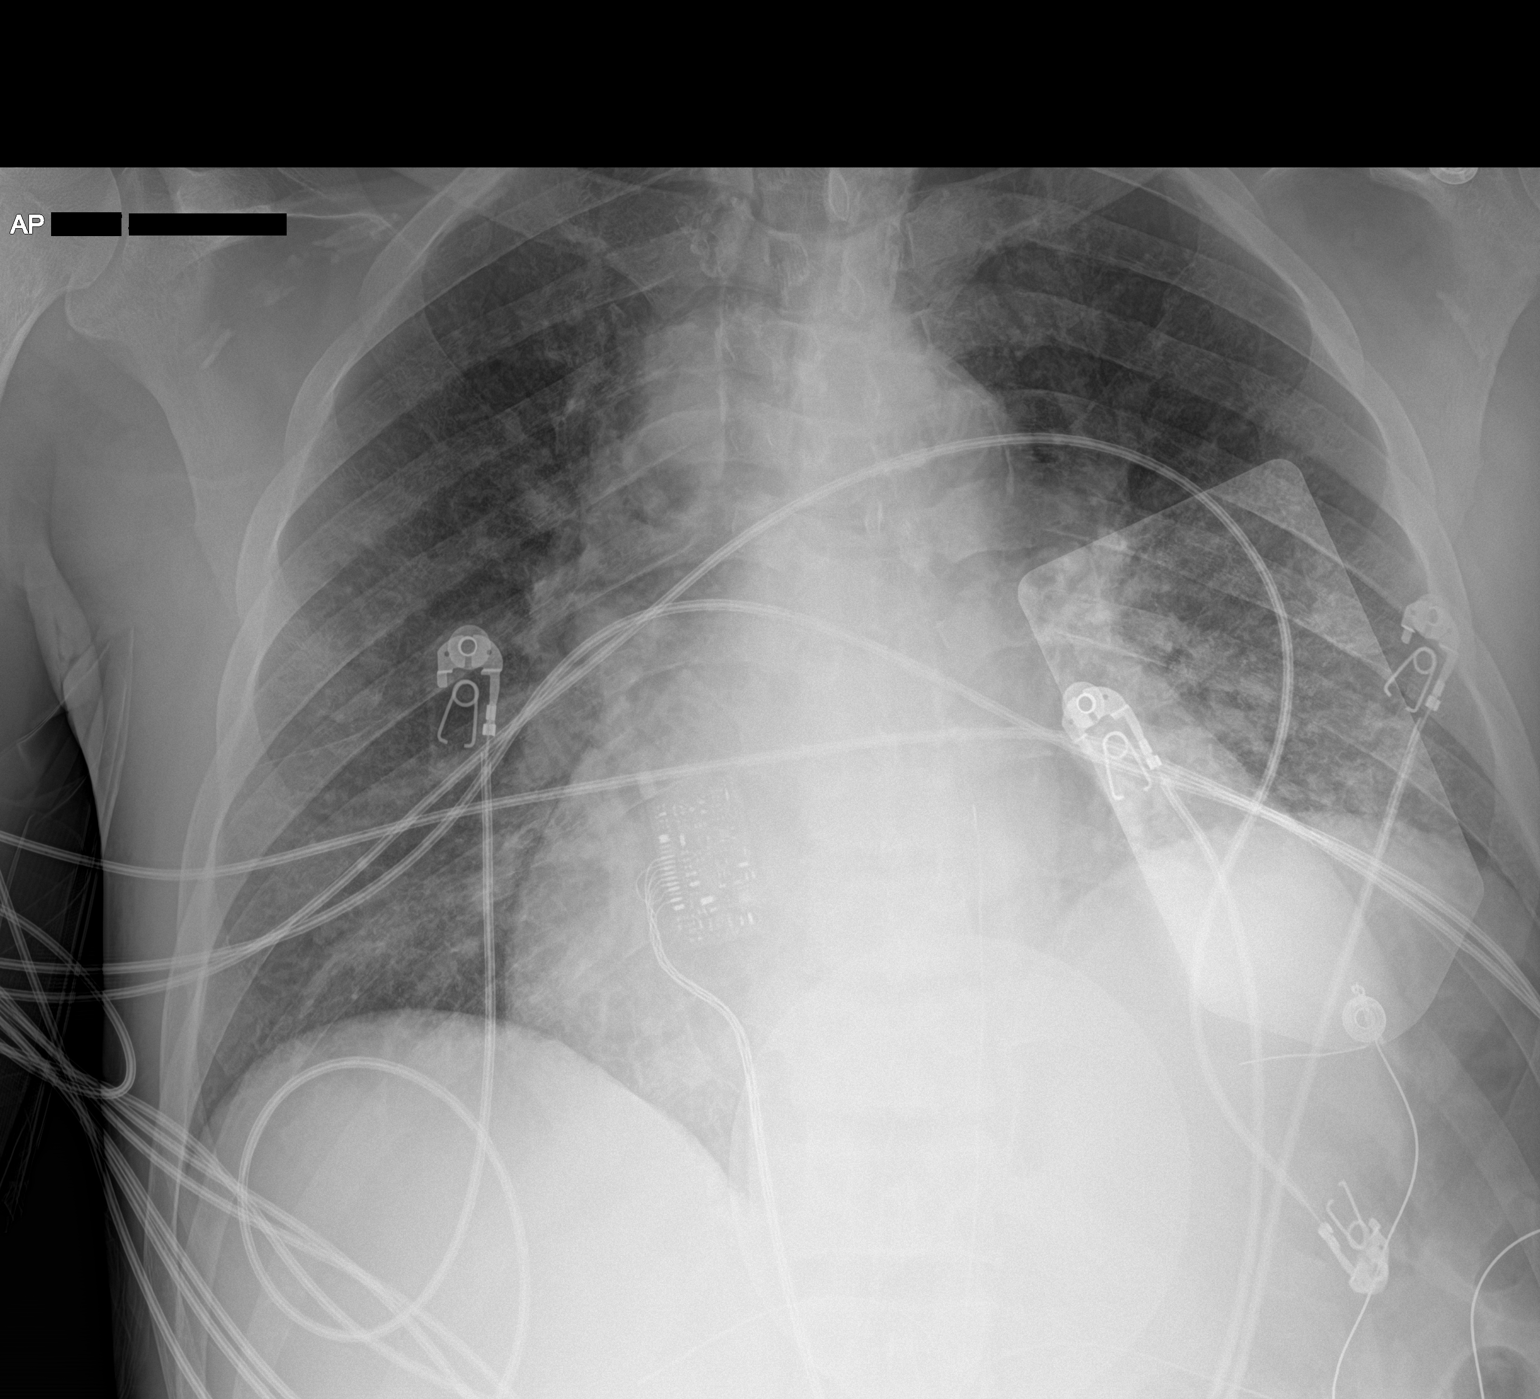

[1 of 1 positions shown; findings below may reference images not displayed]

FINDINGS: Cardiac shadow remains enlarged. Aortic calcifications are again.
Increasing vascular congestion is noted without significant
interstitial edema. No focal infiltrate is noted.
IMPRESSION: Vascular congestion without significant edema.

## 2018-08-23 DIAGNOSIS — N186 End stage renal disease: Secondary | ICD-10-CM | POA: Diagnosis not present

## 2018-08-23 DIAGNOSIS — D509 Iron deficiency anemia, unspecified: Secondary | ICD-10-CM | POA: Diagnosis not present

## 2018-08-23 DIAGNOSIS — D631 Anemia in chronic kidney disease: Secondary | ICD-10-CM | POA: Diagnosis not present

## 2018-08-23 DIAGNOSIS — N2581 Secondary hyperparathyroidism of renal origin: Secondary | ICD-10-CM | POA: Diagnosis not present

## 2018-08-23 IMAGING — CR DG CHEST 2V
2 series · 2 of 2 positions shown · non-contrast
Comparison: February 20, 2017

CLINICAL DATA: Shortness of Breath

EXAM:
CHEST  2 VIEW

[chest pa]
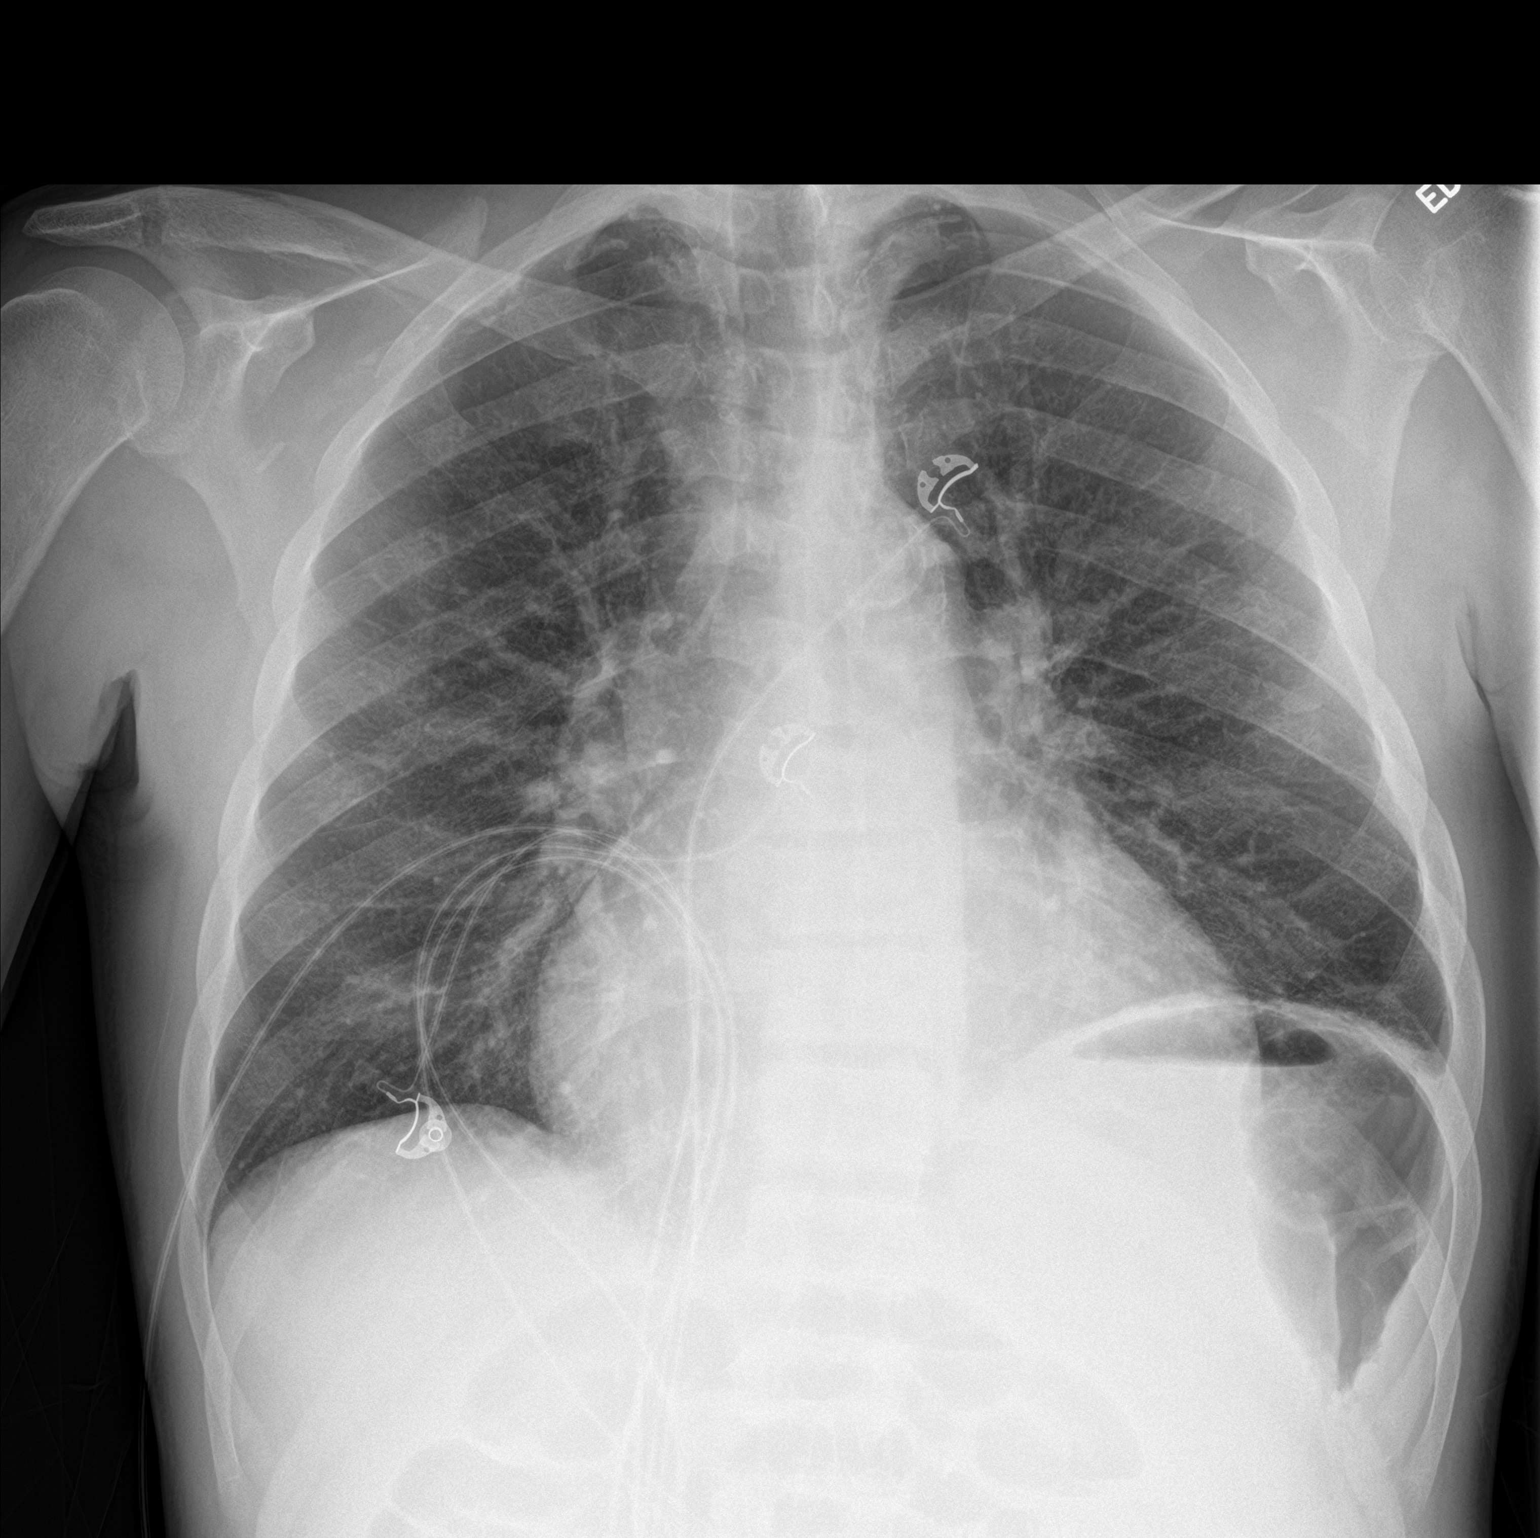

[chest lat]
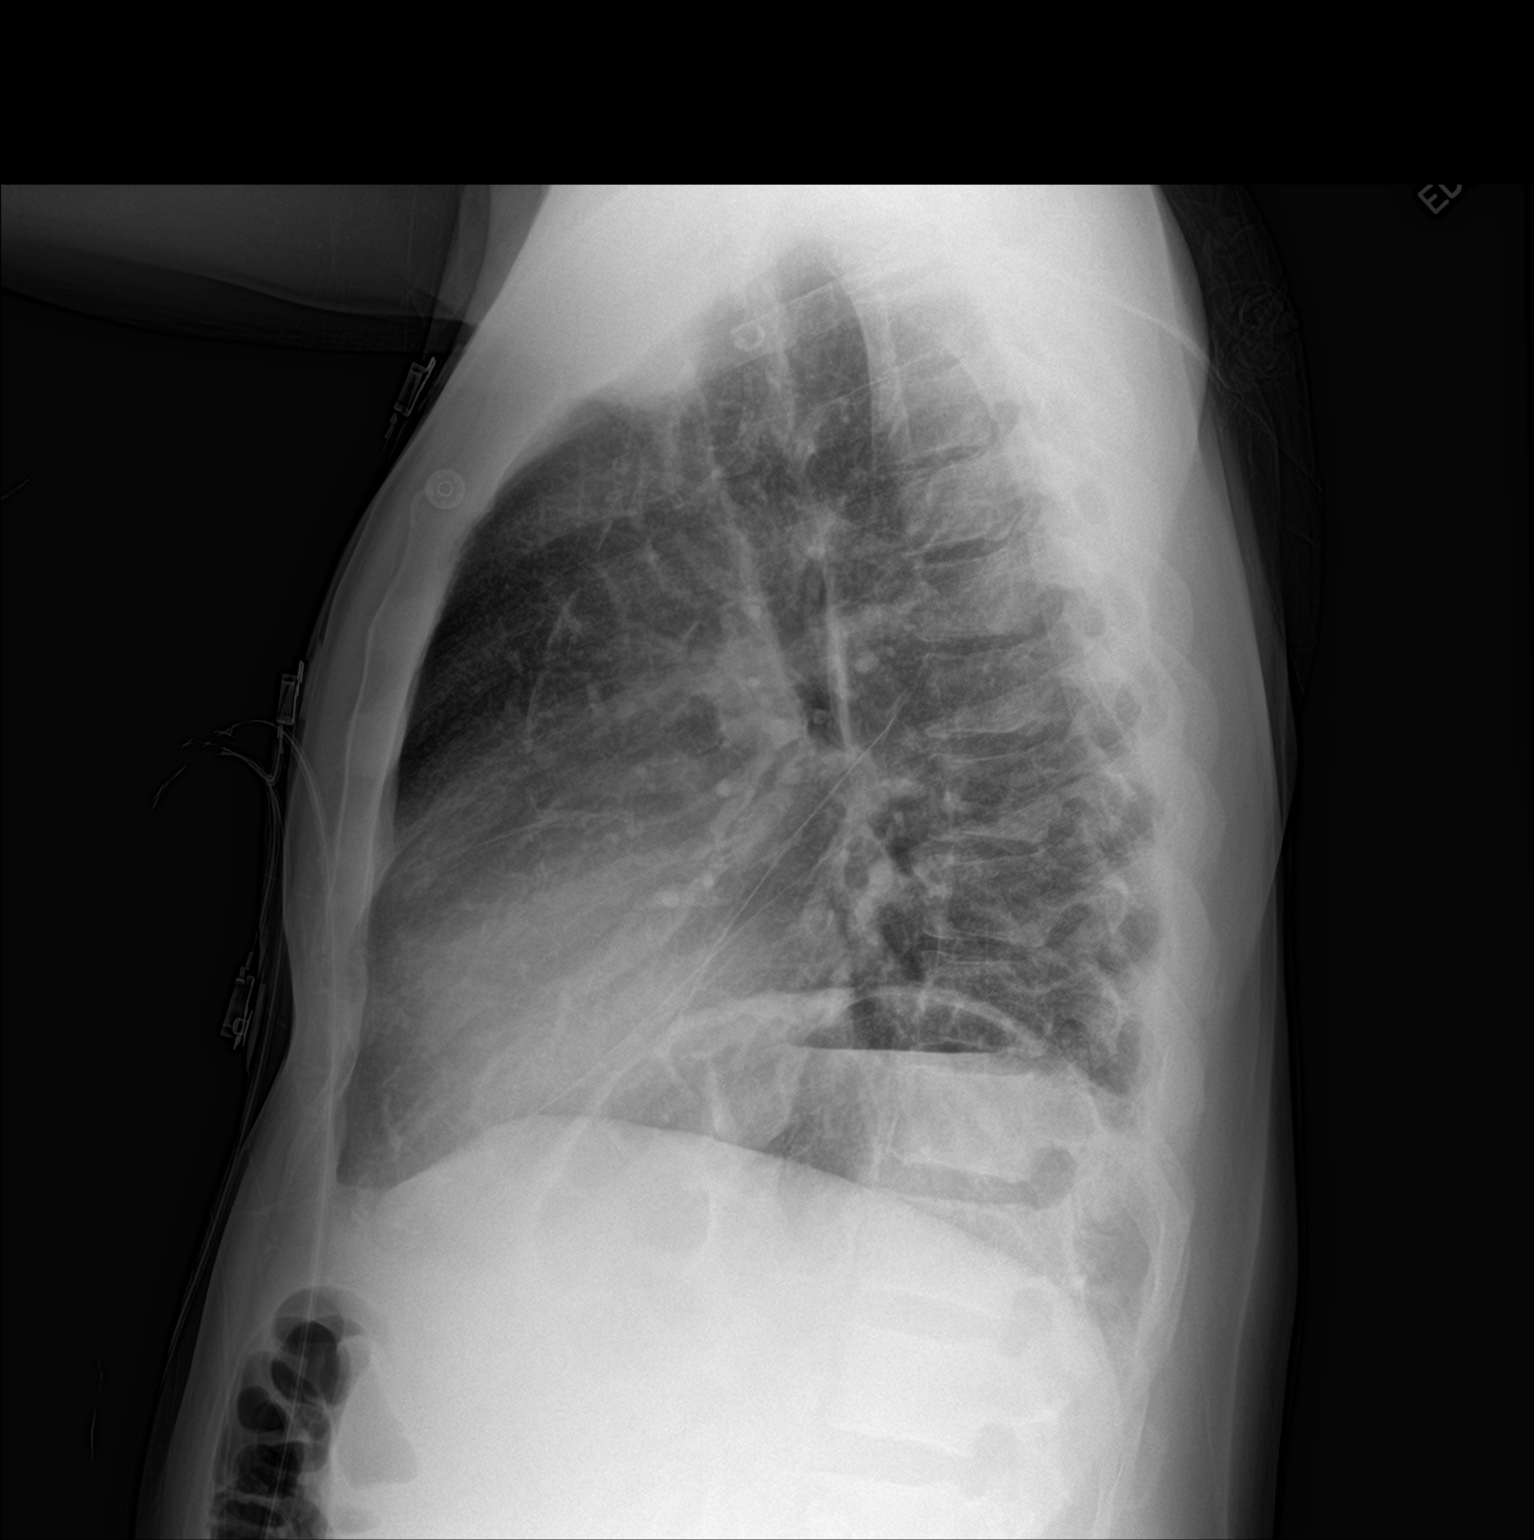

[2 of 2 positions shown; findings below may reference images not displayed]

FINDINGS: There is slight interstitial edema. There is a minimal left pleural
effusion. No consolidation.

There is cardiomegaly with pulmonary venous hypertension. There is
aortic atherosclerosis. There is no evident bone lesion.
IMPRESSION: Evidence of a degree of congestive heart failure with cardiomegaly,
pulmonary venous hypertension, and mild interstitial edema. There is
a small pleural effusion. No consolidation. There is aortic
atherosclerosis.

Aortic Atherosclerosis ([id number]).

## 2018-08-25 DIAGNOSIS — N2581 Secondary hyperparathyroidism of renal origin: Secondary | ICD-10-CM | POA: Diagnosis not present

## 2018-08-25 DIAGNOSIS — D509 Iron deficiency anemia, unspecified: Secondary | ICD-10-CM | POA: Diagnosis not present

## 2018-08-25 DIAGNOSIS — D631 Anemia in chronic kidney disease: Secondary | ICD-10-CM | POA: Diagnosis not present

## 2018-08-25 DIAGNOSIS — N186 End stage renal disease: Secondary | ICD-10-CM | POA: Diagnosis not present

## 2018-08-28 DIAGNOSIS — N2581 Secondary hyperparathyroidism of renal origin: Secondary | ICD-10-CM | POA: Diagnosis not present

## 2018-08-28 DIAGNOSIS — D509 Iron deficiency anemia, unspecified: Secondary | ICD-10-CM | POA: Diagnosis not present

## 2018-08-28 DIAGNOSIS — D631 Anemia in chronic kidney disease: Secondary | ICD-10-CM | POA: Diagnosis not present

## 2018-08-28 DIAGNOSIS — N186 End stage renal disease: Secondary | ICD-10-CM | POA: Diagnosis not present

## 2018-08-30 DIAGNOSIS — D509 Iron deficiency anemia, unspecified: Secondary | ICD-10-CM | POA: Diagnosis not present

## 2018-08-30 DIAGNOSIS — D631 Anemia in chronic kidney disease: Secondary | ICD-10-CM | POA: Diagnosis not present

## 2018-08-30 DIAGNOSIS — N186 End stage renal disease: Secondary | ICD-10-CM | POA: Diagnosis not present

## 2018-08-30 DIAGNOSIS — N2581 Secondary hyperparathyroidism of renal origin: Secondary | ICD-10-CM | POA: Diagnosis not present

## 2018-09-01 DIAGNOSIS — N2581 Secondary hyperparathyroidism of renal origin: Secondary | ICD-10-CM | POA: Diagnosis not present

## 2018-09-01 DIAGNOSIS — D631 Anemia in chronic kidney disease: Secondary | ICD-10-CM | POA: Diagnosis not present

## 2018-09-01 DIAGNOSIS — D509 Iron deficiency anemia, unspecified: Secondary | ICD-10-CM | POA: Diagnosis not present

## 2018-09-01 DIAGNOSIS — N186 End stage renal disease: Secondary | ICD-10-CM | POA: Diagnosis not present

## 2018-09-04 DIAGNOSIS — D509 Iron deficiency anemia, unspecified: Secondary | ICD-10-CM | POA: Diagnosis not present

## 2018-09-04 DIAGNOSIS — D631 Anemia in chronic kidney disease: Secondary | ICD-10-CM | POA: Diagnosis not present

## 2018-09-04 DIAGNOSIS — N2581 Secondary hyperparathyroidism of renal origin: Secondary | ICD-10-CM | POA: Diagnosis not present

## 2018-09-04 DIAGNOSIS — N186 End stage renal disease: Secondary | ICD-10-CM | POA: Diagnosis not present

## 2018-09-06 DIAGNOSIS — D509 Iron deficiency anemia, unspecified: Secondary | ICD-10-CM | POA: Diagnosis not present

## 2018-09-06 DIAGNOSIS — N186 End stage renal disease: Secondary | ICD-10-CM | POA: Diagnosis not present

## 2018-09-06 DIAGNOSIS — D631 Anemia in chronic kidney disease: Secondary | ICD-10-CM | POA: Diagnosis not present

## 2018-09-06 DIAGNOSIS — N2581 Secondary hyperparathyroidism of renal origin: Secondary | ICD-10-CM | POA: Diagnosis not present

## 2018-09-08 DIAGNOSIS — D631 Anemia in chronic kidney disease: Secondary | ICD-10-CM | POA: Diagnosis not present

## 2018-09-08 DIAGNOSIS — N186 End stage renal disease: Secondary | ICD-10-CM | POA: Diagnosis not present

## 2018-09-08 DIAGNOSIS — D509 Iron deficiency anemia, unspecified: Secondary | ICD-10-CM | POA: Diagnosis not present

## 2018-09-08 DIAGNOSIS — N2581 Secondary hyperparathyroidism of renal origin: Secondary | ICD-10-CM | POA: Diagnosis not present

## 2018-09-09 ENCOUNTER — Emergency Department (HOSPITAL_COMMUNITY)
Admission: EM | Admit: 2018-09-09 | Discharge: 2018-09-09 | Disposition: A | Discharge status: Home / Self Care | Payer: Medicare Other | Source: Home / Self Care | Attending: Emergency Medicine | Admitting: Emergency Medicine

## 2018-09-09 ENCOUNTER — Other Ambulatory Visit: Payer: Self-pay

## 2018-09-09 ENCOUNTER — Encounter (HOSPITAL_COMMUNITY): Payer: Self-pay | Admitting: *Deleted

## 2018-09-09 DIAGNOSIS — F1410 Cocaine abuse, uncomplicated: Secondary | ICD-10-CM | POA: Insufficient documentation

## 2018-09-09 DIAGNOSIS — I2510 Atherosclerotic heart disease of native coronary artery without angina pectoris: Secondary | ICD-10-CM | POA: Insufficient documentation

## 2018-09-09 DIAGNOSIS — T829XXA Unspecified complication of cardiac and vascular prosthetic device, implant and graft, initial encounter: Secondary | ICD-10-CM

## 2018-09-09 DIAGNOSIS — Z992 Dependence on renal dialysis: Secondary | ICD-10-CM

## 2018-09-09 DIAGNOSIS — R0902 Hypoxemia: Secondary | ICD-10-CM | POA: Diagnosis not present

## 2018-09-09 DIAGNOSIS — J449 Chronic obstructive pulmonary disease, unspecified: Secondary | ICD-10-CM | POA: Insufficient documentation

## 2018-09-09 DIAGNOSIS — J9600 Acute respiratory failure, unspecified whether with hypoxia or hypercapnia: Secondary | ICD-10-CM | POA: Diagnosis not present

## 2018-09-09 DIAGNOSIS — F419 Anxiety disorder, unspecified: Secondary | ICD-10-CM

## 2018-09-09 DIAGNOSIS — N186 End stage renal disease: Secondary | ICD-10-CM

## 2018-09-09 DIAGNOSIS — J441 Chronic obstructive pulmonary disease with (acute) exacerbation: Secondary | ICD-10-CM | POA: Diagnosis not present

## 2018-09-09 DIAGNOSIS — J9601 Acute respiratory failure with hypoxia: Secondary | ICD-10-CM | POA: Diagnosis not present

## 2018-09-09 DIAGNOSIS — T82838A Hemorrhage of vascular prosthetic devices, implants and grafts, initial encounter: Secondary | ICD-10-CM

## 2018-09-09 DIAGNOSIS — Z79899 Other long term (current) drug therapy: Secondary | ICD-10-CM | POA: Insufficient documentation

## 2018-09-09 DIAGNOSIS — F17210 Nicotine dependence, cigarettes, uncomplicated: Secondary | ICD-10-CM

## 2018-09-09 DIAGNOSIS — R58 Hemorrhage, not elsewhere classified: Secondary | ICD-10-CM | POA: Diagnosis not present

## 2018-09-09 DIAGNOSIS — I252 Old myocardial infarction: Secondary | ICD-10-CM

## 2018-09-09 DIAGNOSIS — A419 Sepsis, unspecified organism: Secondary | ICD-10-CM | POA: Diagnosis not present

## 2018-09-09 DIAGNOSIS — R0602 Shortness of breath: Secondary | ICD-10-CM | POA: Diagnosis not present

## 2018-09-09 DIAGNOSIS — Z66 Do not resuscitate: Secondary | ICD-10-CM

## 2018-09-09 DIAGNOSIS — I120 Hypertensive chronic kidney disease with stage 5 chronic kidney disease or end stage renal disease: Secondary | ICD-10-CM | POA: Insufficient documentation

## 2018-09-09 DIAGNOSIS — Y829 Unspecified medical devices associated with adverse incidents: Secondary | ICD-10-CM | POA: Insufficient documentation

## 2018-09-09 DIAGNOSIS — J811 Chronic pulmonary edema: Secondary | ICD-10-CM | POA: Diagnosis not present

## 2018-09-09 DIAGNOSIS — R188 Other ascites: Secondary | ICD-10-CM | POA: Diagnosis not present

## 2018-09-09 DIAGNOSIS — R0603 Acute respiratory distress: Secondary | ICD-10-CM | POA: Diagnosis not present

## 2018-09-09 NOTE — ED Notes (Signed)
Pt very irritable

## 2018-09-09 NOTE — ED Notes (Signed)
Pt left before  He had repeat vitals and he left without his discharge papers

## 2018-09-09 NOTE — Discharge Instructions (Addendum)
Keep the dressing on for 24 hours.  Return to emergency department for any return of the bleeding.

## 2018-09-09 NOTE — ED Notes (Signed)
Area cleaned and bandaged per pts request coban given loosely

## 2018-09-09 NOTE — ED Triage Notes (Signed)
The pt arrived by [facility] from home  He was taking a nap and felt a pop in his rt arm dialysis fistula with bleeding  Bandaged by ems  No visible bleeding on the bandage  He was last fully dialyzed yesterday

## 2018-09-09 NOTE — ED Provider Notes (Signed)
[facility] EMERGENCY DEPARTMENT Provider Note   CSN: [id number] Arrival date & time: 09/09/18  1851    History   Chief Complaint Chief Complaint  Patient presents with  . bleeding from dialysis graft    HPI Cashis Rill is a 58 y.o. male.     Patient is a 58 year old male who presents with a bleeding dialysis graft.  He completed dialysis yesterday and states that he did not have any trouble with that yesterday but this afternoon it started bleeding from both of his needle sites.  He put a dressing on and came in it has subsequently stopped bleeding.  He denies any dizziness.  He denies any prior histories of bleeding from the site.     Past Medical History:  Diagnosis Date  . Anemia   . Anxiety   . Arthritis    left shoulder  . Atherosclerosis of aorta ([place])   . Cardiomegaly   . Chest pain    DATE UNKNOWN, C/O PERIODICALLY  . Cocaine abuse ([place])   . COPD exacerbation ([place]) 08/17/2016  . Coronary artery disease    stent 02/22/17  . ESRD (end stage renal disease) on dialysis ([place])    "[name]; MWF" (07/04/2017)  . GERD (gastroesophageal reflux disease)    DATE UNKNOWN  . Hemorrhoids   . Hepatitis B, chronic ([place])   . Hepatitis C   . History of kidney stones   . Hyperkalemia   . Hypertension   . Kidney failure   . Metabolic bone disease    Patient denies  . Mitral stenosis   . Myocardial infarction ([place])   . Pneumonia   . Pulmonary edema   . Solitary rectal ulcer syndrome 07/2017   at [facility] for rectal bleeding  . Tubular adenoma of colon     Patient Active Problem List   Diagnosis Date Noted  . CAD (coronary artery disease) 03/11/2018  . Benign neoplasm of cecum   . Benign neoplasm of ascending colon   . Benign neoplasm of descending colon   . Benign neoplasm of rectum   . Atrial fibrillation ([place]) 01/23/2018  . Hx of colonic polyps 01/20/2018  . ESRD (end stage renal disease) ([place]) 11/21/2017  . GERD  (gastroesophageal reflux disease) 11/16/2017  . Liver cirrhosis ([place]) 11/15/2017  . DNR (do not resuscitate)   . Palliative care by specialist   . Hyponatremia 11/04/2017  . SBP (spontaneous bacterial peritonitis) ([place]) 10/30/2017  . Liver disease, chronic 10/30/2017  . SOB (shortness of breath)   . Abdominal pain 10/28/2017  . Upper airway cough syndrome with flattening on f/v loop 10/13/17 c/w vcd 10/17/2017  . Elevated diaphragm 10/13/2017  . Ileus ([place]) 09/29/2017  . Malnutrition of moderate degree 09/29/2017  . Sinus congestion 09/03/2017  . Symptomatic anemia 09/02/2017  . Other cirrhosis of liver ([place]) 09/02/2017  . Left bundle branch block 09/02/2017  . Mitral stenosis 09/02/2017  . Hematochezia 07/15/2017  . Wide-complex tachycardia ([place])   . Endotracheally intubated   . ESRD on dialysis ([place]) 07/04/2017  . Acute respiratory failure with hypoxia ([place]) 06/18/2017  . CKD (chronic kidney disease) stage V requiring chronic dialysis ([place]) 06/18/2017  . History of Cocaine abuse ([place]) 06/18/2017  . Hypertension 06/18/2017  . Infection of AV graft for dialysis ([place]) 06/18/2017  . Anxiety 06/18/2017  . Anemia due to chronic kidney disease 06/18/2017  . Atrial flutter with rapid ventricular response ([place]) 06/18/2017  . Personality disorder ([place]) 06/13/2017  .  Cellulitis 06/12/2017  . Adjustment disorder with mixed anxiety and depressed mood 06/10/2017  . Suicidal ideation 06/10/2017  . Arm wound, left, sequela 06/10/2017  . Dyspnea on exertion 05/29/2017  . Tachycardia 05/29/2017  . Hyperkalemia 05/22/2017  . Acute metabolic encephalopathy   . Anemia 04/23/2017  . Ascites 04/23/2017  . COPD (chronic obstructive pulmonary disease) ([place]) 04/23/2017  . Acute on chronic respiratory failure with hypoxia ([place]) 03/25/2017  . Arrhythmia 03/25/2017  . COPD GOLD 0 with flattening on inps f/v  09/27/2016  . Essential hypertension 09/27/2016  . Fluid overload 08/30/2016  . COPD  exacerbation ([place]) 08/17/2016  . Hypertensive urgency 08/17/2016  . Respiratory failure ([place]) 08/17/2016  . Problem with dialysis access ([place]) 07/23/2016  . Chronic hepatitis B ([place]) 03/05/2014  . Chronic hepatitis C without hepatic coma ([place]) 03/05/2014  . Internal hemorrhoids with bleeding, swelling and itching 03/05/2014  . Thrombocytopenia ([place]) 03/05/2014  . Chest pain 02/27/2014  . Alcohol abuse 04/14/2009  . Cigarette smoker 04/14/2009  . GANGLION CYST 04/14/2009    Past Surgical History:  Procedure Laterality Date  . A/V FISTULAGRAM Left 05/26/2017   Procedure: A/V FISTULAGRAM;  Surgeon: Conrad Urbank, MD;  Location: [facility];  Service: Cardiovascular;  Laterality: Left;  . A/V FISTULAGRAM Right 11/18/2017   Procedure: A/V FISTULAGRAM - Right Arm;  Surgeon: Elam Dutch, MD;  Location: [facility];  Service: Cardiovascular;  Laterality: Right;  . APPLICATION OF WOUND VAC Left 06/14/2017   Procedure: APPLICATION OF WOUND VAC;  Surgeon: Katha Cabal, MD;  Location: [facility] OR[facility];  Service: Vascular;  Laterality: Left;  . AV FISTULA PLACEMENT  2012   BELIEVED WAS PLACED IN JUNE  . AV FISTULA PLACEMENT Right 08/09/2017   Procedure: Creation Right arm ARTERIOVENOUS BRACHIOCEPOHALIC FISTULA;  Surgeon: Elam Dutch, MD;  Location: [facility] OR;  Service: Vascular;  Laterality: Right;  . AV FISTULA PLACEMENT Right 11/22/2017   Procedure: INSERTION OF ARTERIOVENOUS (AV) GORE-TEX GRAFT RIGHT UPPER ARM;  Surgeon: Elam Dutch, MD;  Location: [facility];  Service: Vascular;  Laterality: Right;  . BIOPSY  01/25/2018   Procedure: BIOPSY;  Surgeon: Jerene Bears, MD;  Location: [facility] ENDOSCOPY;  Service: Gastroenterology;;  . COLONOSCOPY    . COLONOSCOPY WITH PROPOFOL N/A 01/25/2018   Procedure: COLONOSCOPY WITH PROPOFOL;  Surgeon: Jerene Bears, MD;  Location: [facility];  Service: Gastroenterology;  Laterality: N/A;  . CORONARY STENT INTERVENTION N/A 02/22/2017   Procedure:  CORONARY STENT INTERVENTION;  Surgeon: Nigel Mormon, MD;  Location: [facility];  Service: Cardiovascular;  Laterality: N/A;  . ESOPHAGOGASTRODUODENOSCOPY (EGD) WITH PROPOFOL N/A 01/25/2018   Procedure: ESOPHAGOGASTRODUODENOSCOPY (EGD) WITH PROPOFOL;  Surgeon: Jerene Bears, MD;  Location: [facility];  Service: Gastroenterology;  Laterality: N/A;  . FLEXIBLE SIGMOIDOSCOPY N/A 07/15/2017   Procedure: FLEXIBLE SIGMOIDOSCOPY;  Surgeon: Carol Ada, MD;  Location: [facility];  Service: Endoscopy;  Laterality: N/A;  . HEMORRHOID BANDING    . I&D EXTREMITY Left 06/01/2017   Procedure: IRRIGATION AND DEBRIDEMENT LEFT ARM HEMATOMA WITH LIGATION OF LEFT ARM AV FISTULA;  Surgeon: Elam Dutch, MD;  Location: [facility];  Service: Vascular;  Laterality: Left;  . I&D EXTREMITY Left 06/14/2017   Procedure: IRRIGATION AND DEBRIDEMENT EXTREMITY;  Surgeon: Katha Cabal, MD;  Location: [facility];  Service: Vascular;  Laterality: Left;  . INSERTION OF DIALYSIS CATHETER  05/30/2017  . INSERTION OF DIALYSIS CATHETER N/A 05/30/2017   Procedure: INSERTION OF DIALYSIS  CATHETER;  Surgeon: Elam Dutch, MD;  Location: [facility];  Service: Vascular;  Laterality: N/A;  . IR PARACENTESIS  08/30/2017  . IR PARACENTESIS  09/29/2017  . IR PARACENTESIS  10/28/2017  . IR PARACENTESIS  11/09/2017  . IR PARACENTESIS  11/16/2017  . IR PARACENTESIS  11/28/2017  . IR PARACENTESIS  12/01/2017  . IR PARACENTESIS  12/06/2017  . IR PARACENTESIS  01/03/2018  . IR PARACENTESIS  01/23/2018  . IR PARACENTESIS  02/07/2018  . IR PARACENTESIS  02/21/2018  . IR PARACENTESIS  03/06/2018  . IR PARACENTESIS  03/17/2018  . IR PARACENTESIS  04/04/2018  . IR RADIOLOGIST EVAL & MGMT  02/14/2018  . LEFT HEART CATH AND CORONARY ANGIOGRAPHY N/A 02/22/2017   Procedure: LEFT HEART CATH AND CORONARY ANGIOGRAPHY;  Surgeon: Nigel Mormon, MD;  Location: [facility];  Service: Cardiovascular;  Laterality: N/A;  . LIGATION OF  ARTERIOVENOUS  FISTULA Left 09/11/1222   Procedure: Plication of Left Arm Arteriovenous Fistula;  Surgeon: Elam Dutch, MD;  Location: [facility];  Service: Vascular;  Laterality: Left;  . POLYPECTOMY    . POLYPECTOMY  01/25/2018   Procedure: POLYPECTOMY;  Surgeon: Jerene Bears, MD;  Location: [facility];  Service: Gastroenterology;;  . REVISON OF ARTERIOVENOUS FISTULA Left 03/05/36   Procedure: PLICATION OF DISTAL ANEURYSMAL SEGEMENT OF LEFT UPPER ARM ARTERIOVENOUS FISTULA;  Surgeon: Elam Dutch, MD;  Location: [facility];  Service: Vascular;  Laterality: Left;  . REVISON OF ARTERIOVENOUS FISTULA Left 0/48/8891   Procedure: Plication of Left Upper Arm Fistula ;  Surgeon: Waynetta Sandy, MD;  Location: [facility];  Service: Vascular;  Laterality: Left;  . SKIN GRAFT SPLIT THICKNESS LEG / FOOT Left    SKIN GRAFT SPLIT THICKNESS LEFT ARM DONOR SITE: LEFT ANTERIOR THIGH  . SKIN SPLIT GRAFT Left 07/04/2017   Procedure: SKIN GRAFT SPLIT THICKNESS LEFT ARM DONOR SITE: LEFT ANTERIOR THIGH;  Surgeon: Elam Dutch, MD;  Location: [facility];  Service: Vascular;  Laterality: Left;  . THROMBECTOMY W/ EMBOLECTOMY Left 06/05/2017   Procedure: EXPLORATION OF LEFT ARM FOR BLEEDING; OVERSEWED PROXIMAL FISTULA;  Surgeon: Angelia Mould, MD;  Location: [facility];  Service: Vascular;  Laterality: Left;  . WOUND EXPLORATION Left 06/03/2017   Procedure: WOUND EXPLORATION WITH WOUND VAC APPLICATION TO LEFT ARM;  Surgeon: Angelia Mould, MD;  Location: [facility];  Service: Vascular;  Laterality: Left;        Home Medications    Prior to Admission medications   Medication Sig Start Date End Date Taking? Authorizing Provider  albuterol (VENTOLIN HFA) 108 (90 Base) MCG/ACT inhaler Inhale 2 puffs into the lungs every 6 (six) hours as needed for wheezing or shortness of breath.    [provider]  aspirin 81 MG EC tablet Take 1 tablet (81 mg total) by mouth daily. Patient not taking: Reported  on 07/22/2018 03/12/18   Geradine Girt, DO  gabapentin (NEURONTIN) 100 MG capsule Take 100 mg by mouth 2 (two) times daily. 12/13/17   [provider]  hydrALAZINE (APRESOLINE) 100 MG tablet Take 1 tablet (100 mg total) by mouth 2 (two) times daily as needed. Taking after HD on Dialysis days Patient taking differently: Take 100 mg by mouth 2 (two) times daily as needed (high blood pressure). If needed on Monday, Wednesday, Friday take after dialysis 03/11/18   Geradine Girt, DO  lactulose (CHRONULAC) 10 GM/15ML solution Take 45 mLs (30 g total) by mouth  2 (two) times daily. 03/11/18   Geradine Girt, DO  metoprolol tartrate (LOPRESSOR) 25 MG tablet Take 1 tablet (25 mg total) by mouth 2 (two) times daily. 03/11/18   Geradine Girt, DO  pantoprazole (PROTONIX) 40 MG tablet Take 1 tablet (40 mg total) by mouth daily at 12 noon. Patient taking differently: Take 40 mg by mouth See admin instructions. Take one tablet (40 mg) by mouth in the morning on Sunday, Tuesday, Thursday, Saturday, take one tablet (40 mg) after dialysis on Monday, Wednesday, Friday. May also take one tablet (40 mg) in the evening as needed for acid reflux. 03/12/18   Geradine Girt, DO  sevelamer carbonate (RENVELA) 800 MG tablet Take 4 tablets with meals  And 2 tablets twice daily with snacks Patient taking differently: Take 2,400-3,200 mg by mouth See admin instructions. Take 3 - 4 tablets (2400 - 3200 mg) up to five times daily with meals or snacks 01/22/18   Patrecia Pour, MD  traZODone (DESYREL) 50 MG tablet Take 50 mg by mouth at bedtime as needed for sleep.     [provider]    Family History Family History  Problem Relation Age of Onset  . Heart disease Mother   . Lung cancer Mother   . Heart disease Father   . Malignant hyperthermia Father   . COPD Father   . Throat cancer Sister   . Esophageal cancer Sister   . Hypertension Other   . COPD Other   . Colon cancer Neg Hx   . Colon polyps Neg Hx   .  Rectal cancer Neg Hx   . Stomach cancer Neg Hx     Social History Social History   Tobacco Use  . Smoking status: Current Every Day Smoker    Packs/day: 0.50    Years: 43.00    Pack years: 21.50    Types: Cigarettes    Start date: 08/13/1973  . Smokeless tobacco: Never Used  Substance Use Topics  . Alcohol use: Not Currently    Frequency: Never    Comment: quit drinking in 2017  . Drug use: Yes    Types: Marijuana    Comment: quit in 2017"     Allergies   Morphine and related; Aspirin; Clonidine derivatives; Tramadol; and Tylenol [acetaminophen]   Review of Systems Review of Systems  Constitutional: Negative for chills, diaphoresis, fatigue and fever.  HENT: Negative for congestion, rhinorrhea and sneezing.   Eyes: Negative.   Respiratory: Negative for cough, chest tightness and shortness of breath.   Cardiovascular: Negative for chest pain and leg swelling.  Gastrointestinal: Negative for abdominal pain, blood in stool, diarrhea, nausea and vomiting.  Genitourinary: Negative for difficulty urinating, flank pain, frequency and hematuria.  Musculoskeletal: Negative for arthralgias and back pain.       Bleeding dialysis graft  Skin: Negative for rash.  Neurological: Negative for dizziness, speech difficulty, weakness, numbness and headaches.     Physical Exam Updated Vital Signs BP (!) 195/86   Pulse 80   Temp 97.9 F (36.6 C) (Oral)   Resp 20   Ht 5\' 9"  (1.753 m)   Wt 73.5 kg   SpO2 95%   BMI 23.92 kg/m   Physical Exam Constitutional:      Appearance: He is well-developed.  HENT:     Head: Normocephalic and atraumatic.  Eyes:     Pupils: Pupils are equal, round, and reactive to light.  Neck:     Musculoskeletal: Normal  range of motion and neck supple.  Cardiovascular:     Rate and Rhythm: Normal rate and regular rhythm.     Heart sounds: Normal heart sounds.  Pulmonary:     Effort: Pulmonary effort is normal. No respiratory distress.     Breath  sounds: Normal breath sounds. No wheezing or rales.  Chest:     Chest wall: No tenderness.  Abdominal:     General: Bowel sounds are normal.     Palpations: Abdomen is soft.     Tenderness: There is no abdominal tenderness. There is no guarding or rebound.  Musculoskeletal: Normal range of motion.     Comments: Patient has 2 scabbed areas over his dialysis graft to his right upper arm.  There are pinpoint scabs from his dialysis needle.  There is no ulcerations.  No active bleeding.  There is a palpable pulse underlying the area.  Lymphadenopathy:     Cervical: No cervical adenopathy.  Skin:    General: Skin is warm and dry.     Findings: No rash.  Neurological:     Mental Status: He is alert and oriented to person, place, and time.      ED Treatments / Results  Labs (all labs ordered are listed, but only abnormal results are displayed) Labs Reviewed - No data to display  EKG None  Radiology No results found.  Procedures Procedures (including critical care time)  Medications Ordered in ED Medications - No data to display   Initial Impression / Assessment and Plan / ED Course  I have reviewed the triage vital signs and the nursing notes.  Pertinent labs & imaging results that were available during my care of the patient were reviewed by me and considered in my medical decision making (see chart for details).        Patient presents with bleeding from the needle insertion site of his dialysis fistula.  There is no ulceration or other suggestions of damage to the fistula.  There is been no bleeding in the ED.  A dressing was applied and patient was advised to wait in the ED for at least an hour to make sure that it does not start bleeding again.  Patient is refusing to wait any longer and wants to go home.  Return precautions were given.  Final Clinical Impressions(s) / ED Diagnoses   Final diagnoses:  Complication of vascular access for dialysis, initial encounter     ED Discharge Orders    None       Malvin Johns, MD 09/09/18 1946

## 2018-09-10 ENCOUNTER — Emergency Department (HOSPITAL_COMMUNITY)
Admission: EM | Admit: 2018-09-10 | Discharge: 2018-09-10 | Disposition: A | Discharge status: Home / Self Care | Payer: Medicare Other | Source: Home / Self Care | Attending: Emergency Medicine | Admitting: Emergency Medicine

## 2018-09-10 ENCOUNTER — Other Ambulatory Visit: Payer: Self-pay

## 2018-09-10 DIAGNOSIS — I120 Hypertensive chronic kidney disease with stage 5 chronic kidney disease or end stage renal disease: Secondary | ICD-10-CM | POA: Diagnosis not present

## 2018-09-10 DIAGNOSIS — F17210 Nicotine dependence, cigarettes, uncomplicated: Secondary | ICD-10-CM

## 2018-09-10 DIAGNOSIS — Z7982 Long term (current) use of aspirin: Secondary | ICD-10-CM | POA: Insufficient documentation

## 2018-09-10 DIAGNOSIS — J441 Chronic obstructive pulmonary disease with (acute) exacerbation: Secondary | ICD-10-CM

## 2018-09-10 DIAGNOSIS — I10 Essential (primary) hypertension: Secondary | ICD-10-CM | POA: Insufficient documentation

## 2018-09-10 DIAGNOSIS — Y828 Other medical devices associated with adverse incidents: Secondary | ICD-10-CM

## 2018-09-10 DIAGNOSIS — Z992 Dependence on renal dialysis: Secondary | ICD-10-CM | POA: Diagnosis not present

## 2018-09-10 DIAGNOSIS — Z79899 Other long term (current) drug therapy: Secondary | ICD-10-CM

## 2018-09-10 DIAGNOSIS — T82838A Hemorrhage of vascular prosthetic devices, implants and grafts, initial encounter: Secondary | ICD-10-CM

## 2018-09-10 DIAGNOSIS — N186 End stage renal disease: Secondary | ICD-10-CM | POA: Diagnosis not present

## 2018-09-10 NOTE — Discharge Instructions (Addendum)
You were seen in the emergency department for some bleeding from her dialysis graft.  There was no bleeding noted here and we have rewrapped it with some clotting material to help stabilize that area.  If it does rebleed briskly please hold some pressure and call 911.  Please leave the dressing on until tomorrow for dialysis.

## 2018-09-10 NOTE — ED Provider Notes (Signed)
[facility] EMERGENCY DEPARTMENT Provider Note   CSN: [id number] Arrival date & time: 09/10/18  1612    History   Chief Complaint Chief Complaint  Patient presents with  . Bleeding from Graft Site    HPI [name] is a 58 y.o. male.  He has a history of end-stage renal disease and gets dialysis Monday Wednesday Friday.  He had his regular dialysis on Friday and was evaluated in the emergency department yesterday for bleeding from his dialysis graft site.  It stopped bleeding yesterday and he was discharged with an Ace wrap over the area.  He said he took that off this morning and there was some bleeding from the proximal site still.  He has had no pain there.  No fevers chills nausea or vomiting.  No syncope.  He is on no blood thinners.     The history is provided by the patient.  Arm Injury  Location:  Arm Arm location:  R upper arm Injury: no   Pain details:    Severity:  No pain   Onset quality:  Sudden   Duration:  2 days   Timing:  Intermittent   Progression:  Resolved Relieved by: pressure dressing. Worsened by:  Nothing Associated symptoms: no fever, no neck pain, no swelling and no tingling     Past Medical History:  Diagnosis Date  . Anemia   . Anxiety   . Arthritis    left shoulder  . Atherosclerosis of aorta ([place])   . Cardiomegaly   . Chest pain    DATE UNKNOWN, C/O PERIODICALLY  . Cocaine abuse ([place])   . COPD exacerbation ([place]) 08/17/2016  . Coronary artery disease    stent 02/22/17  . ESRD (end stage renal disease) on dialysis ([place])    "[name]; MWF" (07/04/2017)  . GERD (gastroesophageal reflux disease)    DATE UNKNOWN  . Hemorrhoids   . Hepatitis B, chronic ([place])   . Hepatitis C   . History of kidney stones   . Hyperkalemia   . Hypertension   . Kidney failure   . Metabolic bone disease    Patient denies  . Mitral stenosis   . Myocardial infarction ([place])   . Pneumonia   . Pulmonary edema   . Solitary  rectal ulcer syndrome 07/2017   at [facility] for rectal bleeding  . Tubular adenoma of colon     Patient Active Problem List   Diagnosis Date Noted  . CAD (coronary artery disease) 03/11/2018  . Benign neoplasm of cecum   . Benign neoplasm of ascending colon   . Benign neoplasm of descending colon   . Benign neoplasm of rectum   . Atrial fibrillation ([place]) 01/23/2018  . Hx of colonic polyps 01/20/2018  . ESRD (end stage renal disease) ([place]) 11/21/2017  . GERD (gastroesophageal reflux disease) 11/16/2017  . Liver cirrhosis ([place]) 11/15/2017  . DNR (do not resuscitate)   . Palliative care by specialist   . Hyponatremia 11/04/2017  . SBP (spontaneous bacterial peritonitis) ([place]) 10/30/2017  . Liver disease, chronic 10/30/2017  . SOB (shortness of breath)   . Abdominal pain 10/28/2017  . Upper airway cough syndrome with flattening on f/v loop 10/13/17 c/w vcd 10/17/2017  . Elevated diaphragm 10/13/2017  . Ileus ([place]) 09/29/2017  . Malnutrition of moderate degree 09/29/2017  . Sinus congestion 09/03/2017  . Symptomatic anemia 09/02/2017  . Other cirrhosis of liver ([place]) 09/02/2017  . Left bundle branch block 09/02/2017  .  Mitral stenosis 09/02/2017  . Hematochezia 07/15/2017  . Wide-complex tachycardia ([place])   . Endotracheally intubated   . ESRD on dialysis ([place]) 07/04/2017  . Acute respiratory failure with hypoxia ([place]) 06/18/2017  . CKD (chronic kidney disease) stage V requiring chronic dialysis ([place]) 06/18/2017  . History of Cocaine abuse ([place]) 06/18/2017  . Hypertension 06/18/2017  . Infection of AV graft for dialysis ([place]) 06/18/2017  . Anxiety 06/18/2017  . Anemia due to chronic kidney disease 06/18/2017  . Atrial flutter with rapid ventricular response ([place]) 06/18/2017  . Personality disorder ([place]) 06/13/2017  . Cellulitis 06/12/2017  . Adjustment disorder with mixed anxiety and depressed mood 06/10/2017  . Suicidal ideation 06/10/2017  . Arm wound, left, sequela  06/10/2017  . Dyspnea on exertion 05/29/2017  . Tachycardia 05/29/2017  . Hyperkalemia 05/22/2017  . Acute metabolic encephalopathy   . Anemia 04/23/2017  . Ascites 04/23/2017  . COPD (chronic obstructive pulmonary disease) ([place]) 04/23/2017  . Acute on chronic respiratory failure with hypoxia ([place]) 03/25/2017  . Arrhythmia 03/25/2017  . COPD GOLD 0 with flattening on inps f/v  09/27/2016  . Essential hypertension 09/27/2016  . Fluid overload 08/30/2016  . COPD exacerbation ([place]) 08/17/2016  . Hypertensive urgency 08/17/2016  . Respiratory failure ([place]) 08/17/2016  . Problem with dialysis access ([place]) 07/23/2016  . Chronic hepatitis B ([place]) 03/05/2014  . Chronic hepatitis C without hepatic coma ([place]) 03/05/2014  . Internal hemorrhoids with bleeding, swelling and itching 03/05/2014  . Thrombocytopenia ([place]) 03/05/2014  . Chest pain 02/27/2014  . Alcohol abuse 04/14/2009  . Cigarette smoker 04/14/2009  . GANGLION CYST 04/14/2009    Past Surgical History:  Procedure Laterality Date  . A/V FISTULAGRAM Left 05/26/2017   Procedure: A/V FISTULAGRAM;  Surgeon: Conrad Corralitos, MD;  Location: [facility];  Service: Cardiovascular;  Laterality: Left;  . A/V FISTULAGRAM Right 11/18/2017   Procedure: A/V FISTULAGRAM - Right Arm;  Surgeon: Elam Dutch, MD;  Location: [facility];  Service: Cardiovascular;  Laterality: Right;  . APPLICATION OF WOUND VAC Left 06/14/2017   Procedure: APPLICATION OF WOUND VAC;  Surgeon: Katha Cabal, MD;  Location: [facility] ORS;  Service: Vascular;  Laterality: Left;  . AV FISTULA PLACEMENT  2012   BELIEVED WAS PLACED IN JUNE  . AV FISTULA PLACEMENT Right 08/09/2017   Procedure: Creation Right arm ARTERIOVENOUS BRACHIOCEPOHALIC FISTULA;  Surgeon: Elam Dutch, MD;  Location: [facility] OR;  Service: Vascular;  Laterality: Right;  . AV FISTULA PLACEMENT Right 11/22/2017   Procedure: INSERTION OF ARTERIOVENOUS (AV) GORE-TEX GRAFT RIGHT UPPER ARM;   Surgeon: Elam Dutch, MD;  Location: [facility];  Service: Vascular;  Laterality: Right;  . BIOPSY  01/25/2018   Procedure: BIOPSY;  Surgeon: Jerene Bears, MD;  Location: [facility] ENDOSCOPY;  Service: Gastroenterology;;  . COLONOSCOPY    . COLONOSCOPY WITH PROPOFOL N/A 01/25/2018   Procedure: COLONOSCOPY WITH PROPOFOL;  Surgeon: Jerene Bears, MD;  Location: [facility];  Service: Gastroenterology;  Laterality: N/A;  . CORONARY STENT INTERVENTION N/A 02/22/2017   Procedure: CORONARY STENT INTERVENTION;  Surgeon: Nigel Mormon, MD;  Location: [facility] CV LAB;  Service: Cardiovascular;  Laterality: N/A;  . ESOPHAGOGASTRODUODENOSCOPY (EGD) WITH PROPOFOL N/A 01/25/2018   Procedure: ESOPHAGOGASTRODUODENOSCOPY (EGD) WITH PROPOFOL;  Surgeon: Jerene Bears, MD;  Location: [facility];  Service: Gastroenterology;  Laterality: N/A;  . FLEXIBLE SIGMOIDOSCOPY N/A 07/15/2017   Procedure: FLEXIBLE SIGMOIDOSCOPY;  Surgeon: Carol Ada, MD;  Location: [facility];  Service: Endoscopy;  Laterality: N/A;  . HEMORRHOID BANDING    . I&D EXTREMITY Left 06/01/2017   Procedure: IRRIGATION AND DEBRIDEMENT LEFT ARM HEMATOMA WITH LIGATION OF LEFT ARM AV FISTULA;  Surgeon: Elam Dutch, MD;  Location: [facility];  Service: Vascular;  Laterality: Left;  . I&D EXTREMITY Left 06/14/2017   Procedure: IRRIGATION AND DEBRIDEMENT EXTREMITY;  Surgeon: Katha Cabal, MD;  Location: [facility] ORS;  Service: Vascular;  Laterality: Left;  . INSERTION OF DIALYSIS CATHETER  05/30/2017  . INSERTION OF DIALYSIS CATHETER N/A 05/30/2017   Procedure: INSERTION OF DIALYSIS CATHETER;  Surgeon: Elam Dutch, MD;  Location: [facility];  Service: Vascular;  Laterality: N/A;  . IR PARACENTESIS  08/30/2017  . IR PARACENTESIS  09/29/2017  . IR PARACENTESIS  10/28/2017  . IR PARACENTESIS  11/09/2017  . IR PARACENTESIS  11/16/2017  . IR PARACENTESIS  11/28/2017  . IR PARACENTESIS  12/01/2017  . IR PARACENTESIS  12/06/2017  . IR PARACENTESIS   01/03/2018  . IR PARACENTESIS  01/23/2018  . IR PARACENTESIS  02/07/2018  . IR PARACENTESIS  02/21/2018  . IR PARACENTESIS  03/06/2018  . IR PARACENTESIS  03/17/2018  . IR PARACENTESIS  04/04/2018  . IR RADIOLOGIST EVAL & MGMT  02/14/2018  . LEFT HEART CATH AND CORONARY ANGIOGRAPHY N/A 02/22/2017   Procedure: LEFT HEART CATH AND CORONARY ANGIOGRAPHY;  Surgeon: Nigel Mormon, MD;  Location: [facility];  Service: Cardiovascular;  Laterality: N/A;  . LIGATION OF ARTERIOVENOUS  FISTULA Left 02/11/9936   Procedure: Plication of Left Arm Arteriovenous Fistula;  Surgeon: Elam Dutch, MD;  Location: [facility];  Service: Vascular;  Laterality: Left;  . POLYPECTOMY    . POLYPECTOMY  01/25/2018   Procedure: POLYPECTOMY;  Surgeon: Jerene Bears, MD;  Location: [facility];  Service: Gastroenterology;;  . REVISON OF ARTERIOVENOUS FISTULA Left 1/69/6789   Procedure: PLICATION OF DISTAL ANEURYSMAL SEGEMENT OF LEFT UPPER ARM ARTERIOVENOUS FISTULA;  Surgeon: Elam Dutch, MD;  Location: [facility];  Service: Vascular;  Laterality: Left;  . REVISON OF ARTERIOVENOUS FISTULA Left 3/81/0175   Procedure: Plication of Left Upper Arm Fistula ;  Surgeon: Waynetta Sandy, MD;  Location: [facility];  Service: Vascular;  Laterality: Left;  . SKIN GRAFT SPLIT THICKNESS LEG / FOOT Left    SKIN GRAFT SPLIT THICKNESS LEFT ARM DONOR SITE: LEFT ANTERIOR THIGH  . SKIN SPLIT GRAFT Left 07/04/2017   Procedure: SKIN GRAFT SPLIT THICKNESS LEFT ARM DONOR SITE: LEFT ANTERIOR THIGH;  Surgeon: Elam Dutch, MD;  Location: [facility];  Service: Vascular;  Laterality: Left;  . THROMBECTOMY W/ EMBOLECTOMY Left 06/05/2017   Procedure: EXPLORATION OF LEFT ARM FOR BLEEDING; OVERSEWED PROXIMAL FISTULA;  Surgeon: Angelia Mould, MD;  Location: [facility];  Service: Vascular;  Laterality: Left;  . WOUND EXPLORATION Left 06/03/2017   Procedure: WOUND EXPLORATION WITH WOUND VAC APPLICATION TO LEFT ARM;  Surgeon: Angelia Mould, MD;  Location: [facility];  Service: Vascular;  Laterality: Left;        Home Medications    Prior to Admission medications   Medication Sig Start Date End Date Taking? Authorizing Provider  albuterol (VENTOLIN HFA) 108 (90 Base) MCG/ACT inhaler Inhale 2 puffs into the lungs every 6 (six) hours as needed for wheezing or shortness of breath.    [provider]  aspirin 81 MG EC tablet Take 1 tablet (81 mg total) by mouth daily. Patient not taking: Reported on 07/22/2018 03/12/18  Eulogio Bear U, DO  gabapentin (NEURONTIN) 100 MG capsule Take 100 mg by mouth 2 (two) times daily. 12/13/17   [provider]  hydrALAZINE (APRESOLINE) 100 MG tablet Take 1 tablet (100 mg total) by mouth 2 (two) times daily as needed. Taking after HD on Dialysis days Patient taking differently: Take 100 mg by mouth 2 (two) times daily as needed (high blood pressure). If needed on Monday, Wednesday, Friday take after dialysis 03/11/18   Geradine Girt, DO  lactulose (CHRONULAC) 10 GM/15ML solution Take 45 mLs (30 g total) by mouth 2 (two) times daily. 03/11/18   Geradine Girt, DO  metoprolol tartrate (LOPRESSOR) 25 MG tablet Take 1 tablet (25 mg total) by mouth 2 (two) times daily. 03/11/18   Geradine Girt, DO  pantoprazole (PROTONIX) 40 MG tablet Take 1 tablet (40 mg total) by mouth daily at 12 noon. Patient taking differently: Take 40 mg by mouth See admin instructions. Take one tablet (40 mg) by mouth in the morning on Sunday, Tuesday, Thursday, Saturday, take one tablet (40 mg) after dialysis on Monday, Wednesday, Friday. May also take one tablet (40 mg) in the evening as needed for acid reflux. 03/12/18   Geradine Girt, DO  sevelamer carbonate (RENVELA) 800 MG tablet Take 4 tablets with meals  And 2 tablets twice daily with snacks Patient taking differently: Take 2,400-3,200 mg by mouth See admin instructions. Take 3 - 4 tablets (2400 - 3200 mg) up to five times daily with meals or snacks 01/22/18    Patrecia Pour, MD  traZODone (DESYREL) 50 MG tablet Take 50 mg by mouth at bedtime as needed for sleep.     [provider]    Family History Family History  Problem Relation Age of Onset  . Heart disease Mother   . Lung cancer Mother   . Heart disease Father   . Malignant hyperthermia Father   . COPD Father   . Throat cancer Sister   . Esophageal cancer Sister   . Hypertension Other   . COPD Other   . Colon cancer Neg Hx   . Colon polyps Neg Hx   . Rectal cancer Neg Hx   . Stomach cancer Neg Hx     Social History Social History   Tobacco Use  . Smoking status: Current Every Day Smoker    Packs/day: 0.50    Years: 43.00    Pack years: 21.50    Types: Cigarettes    Start date: 08/13/1973  . Smokeless tobacco: Never Used  Substance Use Topics  . Alcohol use: Not Currently    Frequency: Never    Comment: quit drinking in 2017  . Drug use: Yes    Types: Marijuana    Comment: quit in 2017"     Allergies   Morphine and related; Aspirin; Clonidine derivatives; Tramadol; and Tylenol [acetaminophen]   Review of Systems Review of Systems  Constitutional: Negative for fever.  HENT: Negative for sore throat.   Respiratory: Negative for shortness of breath.   Cardiovascular: Negative for chest pain.  Gastrointestinal: Negative for abdominal pain.  Genitourinary: Negative for dysuria.  Musculoskeletal: Negative for neck pain.  Skin: Positive for wound. Negative for rash.     Physical Exam Updated Vital Signs BP (!) 139/58 (BP Location: Left Arm)   Pulse 69   Temp (!) 97 F (36.1 C) (Oral)   Resp 20   SpO2 97%   Physical Exam Vitals signs and nursing note reviewed.  Constitutional:      Appearance: He is well-developed.  HENT:     Head: Normocephalic and atraumatic.  Eyes:     Conjunctiva/sclera: Conjunctivae normal.  Neck:     Musculoskeletal: Neck supple.  Pulmonary:     Effort: Pulmonary effort is normal.  Musculoskeletal: Normal range of  motion.     Comments: Right upper arm patient has a graft with positive distal pulse.  There is no active bleeding.  Distal strength and sensation intact.  Cap refill brisk.  Skin:    General: Skin is warm and dry.  Neurological:     Mental Status: He is alert.     GCS: GCS eye subscore is 4. GCS verbal subscore is 5. GCS motor subscore is 6.      ED Treatments / Results  Labs (all labs ordered are listed, but only abnormal results are displayed) Labs Reviewed - No data to display  EKG None  Radiology No results found.  Procedures Procedures (including critical care time)  Medications Ordered in ED Medications - No data to display   Initial Impression / Assessment and Plan / ED Course  I have reviewed the triage vital signs and the nursing notes.  Pertinent labs & imaging results that were available during my care of the patient were reviewed by me and considered in my medical decision making (see chart for details).  Clinical Course as of Sep 09 2341  Sun Sep 10, 2018  1650 There is no bleeding at the site but the patient was very worried that he was cannot fall asleep and wake up bleeding to death.  The area was gently cleaned and some Gelfoam along with some Kerlix wrap and Coban was placed over it.   [MB]  0981 Patient asking to discharge and does not want to have a period of observation to see if the bleeding recurs.  Sounds like he did the same thing yesterday.  He understands to return if any worsening symptoms.   [MB]    Clinical Course User Index [MB] Hayden Rasmussen, MD        Final Clinical Impressions(s) / ED Diagnoses   Final diagnoses:  Hemorrhage from arteriovenous dialysis graft [facility])    ED Discharge Orders    None       Hayden Rasmussen, MD 09/10/18 2344

## 2018-09-10 NOTE — ED Notes (Signed)
Pt asked to stay per Dr. Melina Copa, to make sure bleeding remains stopped. Pt said he needed to go and was hungry. I offered him food, but he declined and wants to leave. Dr. Melina Copa will work on discharge.

## 2018-09-10 NOTE — ED Notes (Signed)
Pt refused discharge vitals and discharge paperwork.

## 2018-09-10 NOTE — ED Triage Notes (Signed)
Pt presents for eval of bleeding from graft site in left upper arm. Pt states when he got home from dialysis Friday he took the bandage off and noticed the bleeding, states he came to [facility] yesterday to have it rewrapped. No visible bleeding from site through bandage. Pt denies pain.

## 2018-09-11 DIAGNOSIS — D509 Iron deficiency anemia, unspecified: Secondary | ICD-10-CM | POA: Diagnosis not present

## 2018-09-11 DIAGNOSIS — N186 End stage renal disease: Secondary | ICD-10-CM | POA: Diagnosis not present

## 2018-09-11 DIAGNOSIS — D631 Anemia in chronic kidney disease: Secondary | ICD-10-CM | POA: Diagnosis not present

## 2018-09-11 DIAGNOSIS — N2581 Secondary hyperparathyroidism of renal origin: Secondary | ICD-10-CM | POA: Diagnosis not present

## 2018-09-12 ENCOUNTER — Emergency Department (HOSPITAL_COMMUNITY): Payer: Medicare Other

## 2018-09-12 ENCOUNTER — Inpatient Hospital Stay (HOSPITAL_COMMUNITY)
Admission: EM | Admit: 2018-09-12 | Discharge: 2018-09-13 | DRG: 871 | Discharge status: Against Medical Advice | Payer: Medicare Other | Attending: Internal Medicine | Admitting: Internal Medicine

## 2018-09-12 DIAGNOSIS — R1084 Generalized abdominal pain: Secondary | ICD-10-CM | POA: Diagnosis not present

## 2018-09-12 DIAGNOSIS — K219 Gastro-esophageal reflux disease without esophagitis: Secondary | ICD-10-CM | POA: Diagnosis present

## 2018-09-12 DIAGNOSIS — F17210 Nicotine dependence, cigarettes, uncomplicated: Secondary | ICD-10-CM | POA: Diagnosis present

## 2018-09-12 DIAGNOSIS — Z992 Dependence on renal dialysis: Secondary | ICD-10-CM

## 2018-09-12 DIAGNOSIS — Z9119 Patient's noncompliance with other medical treatment and regimen: Secondary | ICD-10-CM | POA: Diagnosis not present

## 2018-09-12 DIAGNOSIS — J441 Chronic obstructive pulmonary disease with (acute) exacerbation: Secondary | ICD-10-CM | POA: Diagnosis present

## 2018-09-12 DIAGNOSIS — I499 Cardiac arrhythmia, unspecified: Secondary | ICD-10-CM | POA: Diagnosis not present

## 2018-09-12 DIAGNOSIS — Z5329 Procedure and treatment not carried out because of patient's decision for other reasons: Secondary | ICD-10-CM | POA: Diagnosis present

## 2018-09-12 DIAGNOSIS — I447 Left bundle-branch block, unspecified: Secondary | ICD-10-CM | POA: Diagnosis present

## 2018-09-12 DIAGNOSIS — Z801 Family history of malignant neoplasm of trachea, bronchus and lung: Secondary | ICD-10-CM

## 2018-09-12 DIAGNOSIS — J9601 Acute respiratory failure with hypoxia: Secondary | ICD-10-CM | POA: Diagnosis present

## 2018-09-12 DIAGNOSIS — J811 Chronic pulmonary edema: Secondary | ICD-10-CM | POA: Diagnosis present

## 2018-09-12 DIAGNOSIS — Z800 Family history of malignant neoplasm of digestive organs: Secondary | ICD-10-CM

## 2018-09-12 DIAGNOSIS — K7460 Unspecified cirrhosis of liver: Secondary | ICD-10-CM | POA: Diagnosis present

## 2018-09-12 DIAGNOSIS — Z66 Do not resuscitate: Secondary | ICD-10-CM | POA: Diagnosis present

## 2018-09-12 DIAGNOSIS — I252 Old myocardial infarction: Secondary | ICD-10-CM | POA: Diagnosis not present

## 2018-09-12 DIAGNOSIS — Z87442 Personal history of urinary calculi: Secondary | ICD-10-CM

## 2018-09-12 DIAGNOSIS — R0902 Hypoxemia: Secondary | ICD-10-CM | POA: Diagnosis not present

## 2018-09-12 DIAGNOSIS — R6510 Systemic inflammatory response syndrome (SIRS) of non-infectious origin without acute organ dysfunction: Secondary | ICD-10-CM

## 2018-09-12 DIAGNOSIS — R0603 Acute respiratory distress: Secondary | ICD-10-CM | POA: Diagnosis not present

## 2018-09-12 DIAGNOSIS — I120 Hypertensive chronic kidney disease with stage 5 chronic kidney disease or end stage renal disease: Secondary | ICD-10-CM | POA: Diagnosis present

## 2018-09-12 DIAGNOSIS — Z8249 Family history of ischemic heart disease and other diseases of the circulatory system: Secondary | ICD-10-CM

## 2018-09-12 DIAGNOSIS — I2510 Atherosclerotic heart disease of native coronary artery without angina pectoris: Secondary | ICD-10-CM | POA: Diagnosis present

## 2018-09-12 DIAGNOSIS — R0602 Shortness of breath: Secondary | ICD-10-CM | POA: Diagnosis not present

## 2018-09-12 DIAGNOSIS — J111 Influenza due to unidentified influenza virus with other respiratory manifestations: Secondary | ICD-10-CM

## 2018-09-12 DIAGNOSIS — Z888 Allergy status to other drugs, medicaments and biological substances status: Secondary | ICD-10-CM

## 2018-09-12 DIAGNOSIS — R4702 Dysphasia: Secondary | ICD-10-CM | POA: Diagnosis not present

## 2018-09-12 DIAGNOSIS — B181 Chronic viral hepatitis B without delta-agent: Secondary | ICD-10-CM | POA: Diagnosis present

## 2018-09-12 DIAGNOSIS — N2581 Secondary hyperparathyroidism of renal origin: Secondary | ICD-10-CM | POA: Diagnosis present

## 2018-09-12 DIAGNOSIS — Z885 Allergy status to narcotic agent status: Secondary | ICD-10-CM

## 2018-09-12 DIAGNOSIS — Z825 Family history of asthma and other chronic lower respiratory diseases: Secondary | ICD-10-CM

## 2018-09-12 DIAGNOSIS — Z808 Family history of malignant neoplasm of other organs or systems: Secondary | ICD-10-CM

## 2018-09-12 DIAGNOSIS — J9600 Acute respiratory failure, unspecified whether with hypoxia or hypercapnia: Secondary | ICD-10-CM | POA: Diagnosis not present

## 2018-09-12 DIAGNOSIS — R188 Other ascites: Secondary | ICD-10-CM | POA: Diagnosis present

## 2018-09-12 DIAGNOSIS — J101 Influenza due to other identified influenza virus with other respiratory manifestations: Secondary | ICD-10-CM | POA: Diagnosis present

## 2018-09-12 DIAGNOSIS — E8889 Other specified metabolic disorders: Secondary | ICD-10-CM | POA: Diagnosis present

## 2018-09-12 DIAGNOSIS — N186 End stage renal disease: Secondary | ICD-10-CM | POA: Diagnosis present

## 2018-09-12 DIAGNOSIS — B1920 Unspecified viral hepatitis C without hepatic coma: Secondary | ICD-10-CM | POA: Diagnosis present

## 2018-09-12 DIAGNOSIS — I4891 Unspecified atrial fibrillation: Secondary | ICD-10-CM | POA: Diagnosis present

## 2018-09-12 DIAGNOSIS — A419 Sepsis, unspecified organism: Secondary | ICD-10-CM | POA: Diagnosis present

## 2018-09-12 DIAGNOSIS — D631 Anemia in chronic kidney disease: Secondary | ICD-10-CM | POA: Diagnosis present

## 2018-09-12 DIAGNOSIS — Z886 Allergy status to analgesic agent status: Secondary | ICD-10-CM

## 2018-09-12 DIAGNOSIS — Z7982 Long term (current) use of aspirin: Secondary | ICD-10-CM

## 2018-09-12 DIAGNOSIS — Z79899 Other long term (current) drug therapy: Secondary | ICD-10-CM

## 2018-09-12 LAB — LACTIC ACID, PLASMA: Lactic Acid, Venous: 1.2 mmol/L (ref 0.5–1.9)

## 2018-09-12 LAB — COMPREHENSIVE METABOLIC PANEL
ALT: 19 U/L (ref 0–44)
AST: 39 U/L (ref 15–41)
Albumin: 4.6 g/dL (ref 3.5–5.0)
Alkaline Phosphatase: 125 U/L (ref 38–126)
Anion gap: 16 — ABNORMAL HIGH (ref 5–15)
BUN: 19 mg/dL (ref 6–20)
CO2: 26 mmol/L (ref 22–32)
Calcium: 8.5 mg/dL — ABNORMAL LOW (ref 8.9–10.3)
Chloride: 89 mmol/L — ABNORMAL LOW (ref 98–111)
Creatinine, Ser: 6.75 mg/dL — ABNORMAL HIGH (ref 0.61–1.24)
GFR calc Af Amer: 10 mL/min — ABNORMAL LOW (ref >60)
GFR calc non Af Amer: 8 mL/min — ABNORMAL LOW (ref >60)
Glucose, Bld: 81 mg/dL (ref 70–99)
Potassium: 3.9 mmol/L (ref 3.5–5.1)
Sodium: 131 mmol/L — ABNORMAL LOW (ref 135–145)
Total Bilirubin: 1.8 mg/dL — ABNORMAL HIGH (ref 0.3–1.2)
Total Protein: 8.7 g/dL — ABNORMAL HIGH (ref 6.5–8.1)

## 2018-09-12 LAB — CBC
HCT: 32.4 % — ABNORMAL LOW (ref 39.0–52.0)
Hemoglobin: 10.9 g/dL — ABNORMAL LOW (ref 13.0–17.0)
MCH: 26.5 pg (ref 26.0–34.0)
MCHC: 33.6 g/dL (ref 30.0–36.0)
MCV: 78.8 fL — ABNORMAL LOW (ref 80.0–100.0)
Platelets: 85 10*3/uL — ABNORMAL LOW (ref 150–400)
RBC: 4.11 MIL/uL — ABNORMAL LOW (ref 4.22–5.81)
RDW: 16.7 % — ABNORMAL HIGH (ref 11.5–15.5)
WBC: 7.5 10*3/uL (ref 4.0–10.5)
nRBC: 0 % (ref 0.0–0.2)

## 2018-09-12 LAB — INFLUENZA PANEL BY PCR (TYPE A & B)
Influenza A By PCR: POSITIVE — AB
Influenza B By PCR: NEGATIVE

## 2018-09-12 LAB — MRSA PCR SCREENING: MRSA by PCR: NEGATIVE

## 2018-09-12 MED ORDER — SODIUM CHLORIDE 0.9% FLUSH: 3.0000 mL | INTRAVENOUS | Status: DC | PRN

## 2018-09-12 MED ORDER — DARBEPOETIN ALFA 100 MCG/0.5ML IJ SOSY
100.0000 ug | PREFILLED_SYRINGE | INTRAMUSCULAR | Status: DC
  Filled 2018-09-12: qty 0.5

## 2018-09-12 MED ORDER — SODIUM CHLORIDE 0.9 % IV SOLN
2.0000 g | Freq: Once | INTRAVENOUS | Status: AC
  Administered 2018-09-12: 2 g via INTRAVENOUS
  Filled 2018-09-12: qty 2

## 2018-09-12 MED ORDER — VANCOMYCIN HCL IN DEXTROSE 1-5 GM/200ML-% IV SOLN: 1000.0000 mg | Freq: Once | INTRAVENOUS | Status: DC

## 2018-09-12 MED ORDER — OSELTAMIVIR PHOSPHATE 30 MG PO CAPS: 30.0000 mg | ORAL_CAPSULE | ORAL | Status: DC

## 2018-09-12 MED ORDER — VANCOMYCIN HCL IN DEXTROSE 750-5 MG/150ML-% IV SOLN
750.0000 mg | INTRAVENOUS | Status: DC
  Filled 2018-09-12: qty 150

## 2018-09-12 MED ORDER — SODIUM CHLORIDE 0.9 % IV SOLN
2.0000 g | INTRAVENOUS | Status: DC
  Filled 2018-09-12: qty 2

## 2018-09-12 MED ORDER — GABAPENTIN 100 MG PO CAPS
100.0000 mg | ORAL_CAPSULE | Freq: Two times a day (BID) | ORAL | Status: DC
  Administered 2018-09-12 (×2): 100 mg via ORAL
  Filled 2018-09-12 (×2): qty 1

## 2018-09-12 MED ORDER — METRONIDAZOLE IN NACL 5-0.79 MG/ML-% IV SOLN
500.0000 mg | Freq: Three times a day (TID) | INTRAVENOUS | Status: DC
  Administered 2018-09-12 – 2018-09-13 (×3): 500 mg via INTRAVENOUS
  Filled 2018-09-12 (×3): qty 100

## 2018-09-12 MED ORDER — CHLORHEXIDINE GLUCONATE CLOTH 2 % EX PADS: 6.0000 | MEDICATED_PAD | Freq: Every day | CUTANEOUS | Status: DC

## 2018-09-12 MED ORDER — PANTOPRAZOLE SODIUM 40 MG PO TBEC
40.0000 mg | DELAYED_RELEASE_TABLET | Freq: Every day | ORAL | Status: DC
  Administered 2018-09-12: 40 mg via ORAL
  Filled 2018-09-12: qty 1

## 2018-09-12 MED ORDER — ASPIRIN EC 81 MG PO TBEC
81.0000 mg | DELAYED_RELEASE_TABLET | Freq: Every day | ORAL | Status: DC
  Filled 2018-09-12 (×2): qty 1

## 2018-09-12 MED ORDER — ALBUTEROL SULFATE HFA 108 (90 BASE) MCG/ACT IN AERS
2.0000 | INHALATION_SPRAY | Freq: Four times a day (QID) | RESPIRATORY_TRACT | Status: DC | PRN
  Filled 2018-09-12: qty 6.7

## 2018-09-12 MED ORDER — HEPARIN SODIUM (PORCINE) 5000 UNIT/ML IJ SOLN: 5000.0000 [IU] | Freq: Two times a day (BID) | INTRAMUSCULAR | Status: DC

## 2018-09-12 MED ORDER — SODIUM CHLORIDE 0.9 % IV SOLN: 250.0000 mL | INTRAVENOUS | Status: DC | PRN

## 2018-09-12 MED ORDER — IBUPROFEN 400 MG PO TABS
600.0000 mg | ORAL_TABLET | Freq: Once | ORAL | Status: AC
  Administered 2018-09-12: 600 mg via ORAL
  Filled 2018-09-12: qty 1

## 2018-09-12 MED ORDER — ALBUTEROL SULFATE (2.5 MG/3ML) 0.083% IN NEBU: 2.5000 mg | INHALATION_SOLUTION | Freq: Four times a day (QID) | RESPIRATORY_TRACT | Status: DC | PRN

## 2018-09-12 MED ORDER — CALCITRIOL 0.5 MCG PO CAPS
1.5000 ug | ORAL_CAPSULE | ORAL | Status: DC
  Filled 2018-09-12: qty 3

## 2018-09-12 MED ORDER — OSELTAMIVIR PHOSPHATE 75 MG PO CAPS: 75.0000 mg | ORAL_CAPSULE | Freq: Two times a day (BID) | ORAL | Status: DC

## 2018-09-12 MED ORDER — METOPROLOL TARTRATE 25 MG PO TABS
25.0000 mg | ORAL_TABLET | Freq: Two times a day (BID) | ORAL | Status: DC
  Administered 2018-09-12 (×2): 25 mg via ORAL
  Filled 2018-09-12 (×2): qty 1

## 2018-09-12 MED ORDER — SODIUM CHLORIDE 0.9% FLUSH
3.0000 mL | Freq: Two times a day (BID) | INTRAVENOUS | Status: DC
  Administered 2018-09-12: 3 mL via INTRAVENOUS

## 2018-09-12 MED ORDER — SEVELAMER CARBONATE 800 MG PO TABS: 2400.0000 mg | ORAL_TABLET | ORAL | Status: DC

## 2018-09-12 MED ORDER — ONDANSETRON HCL 4 MG/2ML IJ SOLN
4.0000 mg | Freq: Four times a day (QID) | INTRAMUSCULAR | Status: DC | PRN
  Administered 2018-09-12: 4 mg via INTRAVENOUS
  Filled 2018-09-12: qty 2

## 2018-09-12 MED ORDER — SEVELAMER CARBONATE 800 MG PO TABS: 3200.0000 mg | ORAL_TABLET | Freq: Three times a day (TID) | ORAL | Status: DC

## 2018-09-12 MED ORDER — TRAZODONE HCL 50 MG PO TABS
50.0000 mg | ORAL_TABLET | Freq: Every evening | ORAL | Status: DC | PRN
  Administered 2018-09-12: 50 mg via ORAL
  Filled 2018-09-12: qty 1

## 2018-09-12 MED ORDER — OSELTAMIVIR PHOSPHATE 75 MG PO CAPS
75.0000 mg | ORAL_CAPSULE | Freq: Once | ORAL | Status: AC
  Administered 2018-09-12: 75 mg via ORAL
  Filled 2018-09-12: qty 1

## 2018-09-12 MED ORDER — LACTULOSE 10 GM/15ML PO SOLN
30.0000 g | Freq: Two times a day (BID) | ORAL | Status: DC
  Filled 2018-09-12: qty 45

## 2018-09-12 MED ORDER — VANCOMYCIN HCL 10 G IV SOLR
1500.0000 mg | Freq: Once | INTRAVENOUS | Status: AC
  Administered 2018-09-12: 1500 mg via INTRAVENOUS
  Filled 2018-09-12: qty 1500

## 2018-09-12 MED ORDER — SEVELAMER CARBONATE 800 MG PO TABS
2400.0000 mg | ORAL_TABLET | ORAL | Status: DC
  Administered 2018-09-12: 2400 mg via ORAL
  Filled 2018-09-12: qty 3

## 2018-09-12 MED ORDER — ONDANSETRON HCL 4 MG PO TABS: 4.0000 mg | ORAL_TABLET | Freq: Four times a day (QID) | ORAL | Status: DC | PRN

## 2018-09-12 NOTE — Progress Notes (Signed)
Patient resting on 4lpm [place] at this time. BIPAP on standy by at bedside. No distress noted.

## 2018-09-12 NOTE — ED Notes (Signed)
Pharmacy messaged about unverified meds 

## 2018-09-12 NOTE — ED Provider Notes (Signed)
[facility] EMERGENCY DEPARTMENT Provider Note   CSN: [id number] Arrival date & time: 09/12/18  0840    History   Chief Complaint Chief Complaint  Patient presents with  . Respiratory Distress  . flu symptoms    HPI [name] is a 58 y.o. male.     HPI Patient is a 58 year old male presents the emergency department with a fever of 102.9.  End-stage renal disease who dialyzes on Monday Wednesday Friday.  He dialyzed yesterday without complications.  Yesterday evening and through the night he began having fever myalgias headache and generalized discomfort everywhere.  Reports nausea and diarrhea.  Denies vomiting.  Symptoms are moderate to severe in severity.  Arrived to the emergency department with increased work of breathing and feeling short of breath.   Past Medical History:  Diagnosis Date  . Anemia   . Anxiety   . Arthritis    left shoulder  . Atherosclerosis of aorta ([place])   . Cardiomegaly   . Chest pain    DATE UNKNOWN, C/O PERIODICALLY  . Cocaine abuse ([place])   . COPD exacerbation ([place]) 08/17/2016  . Coronary artery disease    stent 02/22/17  . ESRD (end stage renal disease) on dialysis ([place])    "[name]; MWF" (07/04/2017)  . GERD (gastroesophageal reflux disease)    DATE UNKNOWN  . Hemorrhoids   . Hepatitis B, chronic ([place])   . Hepatitis C   . History of kidney stones   . Hyperkalemia   . Hypertension   . Kidney failure   . Metabolic bone disease    Patient denies  . Mitral stenosis   . Myocardial infarction ([place])   . Pneumonia   . Pulmonary edema   . Solitary rectal ulcer syndrome 07/2017   at flex [facility] for rectal bleeding  . Tubular adenoma of colon     Patient Active Problem List   Diagnosis Date Noted  . CAD (coronary artery disease) 03/11/2018  . Benign neoplasm of cecum   . Benign neoplasm of ascending colon   . Benign neoplasm of descending colon   . Benign neoplasm of rectum   . Atrial fibrillation ([place])  01/23/2018  . Hx of colonic polyps 01/20/2018  . ESRD (end stage renal disease) ([place]) 11/21/2017  . GERD (gastroesophageal reflux disease) 11/16/2017  . Liver cirrhosis ([place]) 11/15/2017  . DNR (do not resuscitate)   . Palliative care by specialist   . Hyponatremia 11/04/2017  . SBP (spontaneous bacterial peritonitis) ([place]) 10/30/2017  . Liver disease, chronic 10/30/2017  . SOB (shortness of breath)   . Abdominal pain 10/28/2017  . Upper airway cough syndrome with flattening on f/v loop 10/13/17 c/w vcd 10/17/2017  . Elevated diaphragm 10/13/2017  . Ileus ([place]) 09/29/2017  . Malnutrition of moderate degree 09/29/2017  . Sinus congestion 09/03/2017  . Symptomatic anemia 09/02/2017  . Other cirrhosis of liver ([place]) 09/02/2017  . Left bundle branch block 09/02/2017  . Mitral stenosis 09/02/2017  . Hematochezia 07/15/2017  . Wide-complex tachycardia ([place])   . Endotracheally intubated   . ESRD on dialysis ([place]) 07/04/2017  . Acute respiratory failure with hypoxia ([place]) 06/18/2017  . CKD (chronic kidney disease) stage V requiring chronic dialysis ([place]) 06/18/2017  . History of Cocaine abuse ([place]) 06/18/2017  . Hypertension 06/18/2017  . Infection of AV graft for dialysis ([place]) 06/18/2017  . Anxiety 06/18/2017  . Anemia due to chronic kidney disease 06/18/2017  . Atrial flutter with rapid ventricular response ([place])  06/18/2017  . Personality disorder ([place]) 06/13/2017  . Cellulitis 06/12/2017  . Adjustment disorder with mixed anxiety and depressed mood 06/10/2017  . Suicidal ideation 06/10/2017  . Arm wound, left, sequela 06/10/2017  . Dyspnea on exertion 05/29/2017  . Tachycardia 05/29/2017  . Hyperkalemia 05/22/2017  . Acute metabolic encephalopathy   . Anemia 04/23/2017  . Ascites 04/23/2017  . COPD (chronic obstructive pulmonary disease) ([place]) 04/23/2017  . Acute on chronic respiratory failure with hypoxia ([place]) 03/25/2017  . Arrhythmia 03/25/2017  . COPD GOLD 0 with flattening  on inps f/v  09/27/2016  . Essential hypertension 09/27/2016  . Fluid overload 08/30/2016  . COPD exacerbation ([place]) 08/17/2016  . Hypertensive urgency 08/17/2016  . Respiratory failure ([place]) 08/17/2016  . Problem with dialysis access ([place]) 07/23/2016  . Chronic hepatitis B ([place]) 03/05/2014  . Chronic hepatitis C without hepatic coma ([place]) 03/05/2014  . Internal hemorrhoids with bleeding, swelling and itching 03/05/2014  . Thrombocytopenia ([place]) 03/05/2014  . Chest pain 02/27/2014  . Alcohol abuse 04/14/2009  . Cigarette smoker 04/14/2009  . GANGLION CYST 04/14/2009    Past Surgical History:  Procedure Laterality Date  . A/V FISTULAGRAM Left 05/26/2017   Procedure: A/V FISTULAGRAM;  Surgeon: Conrad Peoria, MD;  Location: [facility];  Service: Cardiovascular;  Laterality: Left;  . A/V FISTULAGRAM Right 11/18/2017   Procedure: A/V FISTULAGRAM - Right Arm;  Surgeon: Elam Dutch, MD;  Location: [facility];  Service: Cardiovascular;  Laterality: Right;  . APPLICATION OF WOUND VAC Left 06/14/2017   Procedure: APPLICATION OF WOUND VAC;  Surgeon: Katha Cabal, MD;  Location: [facility] ORS;  Service: Vascular;  Laterality: Left;  . AV FISTULA PLACEMENT  2012   BELIEVED WAS PLACED IN JUNE  . AV FISTULA PLACEMENT Right 08/09/2017   Procedure: Creation Right arm ARTERIOVENOUS BRACHIOCEPOHALIC FISTULA;  Surgeon: Elam Dutch, MD;  Location: [facility] OR;  Service: Vascular;  Laterality: Right;  . AV FISTULA PLACEMENT Right 11/22/2017   Procedure: INSERTION OF ARTERIOVENOUS (AV) GORE-TEX GRAFT RIGHT UPPER ARM;  Surgeon: Elam Dutch, MD;  Location: [facility];  Service: Vascular;  Laterality: Right;  . BIOPSY  01/25/2018   Procedure: BIOPSY;  Surgeon: Jerene Bears, MD;  Location: [facility] ENDOSCOPY;  Service: Gastroenterology;;  . COLONOSCOPY    . COLONOSCOPY WITH PROPOFOL N/A 01/25/2018   Procedure: COLONOSCOPY WITH PROPOFOL;  Surgeon: Jerene Bears, MD;  Location: [facility];   Service: Gastroenterology;  Laterality: N/A;  . CORONARY STENT INTERVENTION N/A 02/22/2017   Procedure: CORONARY STENT INTERVENTION;  Surgeon: Nigel Mormon, MD;  Location: [facility];  Service: Cardiovascular;  Laterality: N/A;  . ESOPHAGOGASTRODUODENOSCOPY (EGD) WITH PROPOFOL N/A 01/25/2018   Procedure: ESOPHAGOGASTRODUODENOSCOPY (EGD) WITH PROPOFOL;  Surgeon: Jerene Bears, MD;  Location: [facility];  Service: Gastroenterology;  Laterality: N/A;  . FLEXIBLE SIGMOIDOSCOPY N/A 07/15/2017   Procedure: FLEXIBLE SIGMOIDOSCOPY;  Surgeon: Carol Ada, MD;  Location: [facility];  Service: Endoscopy;  Laterality: N/A;  . HEMORRHOID BANDING    . I&D EXTREMITY Left 06/01/2017   Procedure: IRRIGATION AND DEBRIDEMENT LEFT ARM HEMATOMA WITH LIGATION OF LEFT ARM AV FISTULA;  Surgeon: Elam Dutch, MD;  Location: [facility];  Service: Vascular;  Laterality: Left;  . I&D EXTREMITY Left 06/14/2017   Procedure: IRRIGATION AND DEBRIDEMENT EXTREMITY;  Surgeon: Katha Cabal, MD;  Location: [facility] ORS;  Service: Vascular;  Laterality: Left;  . INSERTION OF DIALYSIS CATHETER  05/30/2017  . INSERTION OF DIALYSIS  CATHETER N/A 05/30/2017   Procedure: INSERTION OF DIALYSIS CATHETER;  Surgeon: Elam Dutch, MD;  Location: [facility];  Service: Vascular;  Laterality: N/A;  . IR PARACENTESIS  08/30/2017  . IR PARACENTESIS  09/29/2017  . IR PARACENTESIS  10/28/2017  . IR PARACENTESIS  11/09/2017  . IR PARACENTESIS  11/16/2017  . IR PARACENTESIS  11/28/2017  . IR PARACENTESIS  12/01/2017  . IR PARACENTESIS  12/06/2017  . IR PARACENTESIS  01/03/2018  . IR PARACENTESIS  01/23/2018  . IR PARACENTESIS  02/07/2018  . IR PARACENTESIS  02/21/2018  . IR PARACENTESIS  03/06/2018  . IR PARACENTESIS  03/17/2018  . IR PARACENTESIS  04/04/2018  . IR RADIOLOGIST EVAL & MGMT  02/14/2018  . LEFT HEART CATH AND CORONARY ANGIOGRAPHY N/A 02/22/2017   Procedure: LEFT HEART CATH AND CORONARY ANGIOGRAPHY;  Surgeon: Nigel Mormon,  MD;  Location: [facility];  Service: Cardiovascular;  Laterality: N/A;  . LIGATION OF ARTERIOVENOUS  FISTULA Left 02/12/3824   Procedure: Plication of Left Arm Arteriovenous Fistula;  Surgeon: Elam Dutch, MD;  Location: [facility];  Service: Vascular;  Laterality: Left;  . POLYPECTOMY    . POLYPECTOMY  01/25/2018   Procedure: POLYPECTOMY;  Surgeon: Jerene Bears, MD;  Location: [facility];  Service: Gastroenterology;;  . REVISON OF ARTERIOVENOUS FISTULA Left 0/53/9767   Procedure: PLICATION OF DISTAL ANEURYSMAL SEGEMENT OF LEFT UPPER ARM ARTERIOVENOUS FISTULA;  Surgeon: Elam Dutch, MD;  Location: [facility];  Service: Vascular;  Laterality: Left;  . REVISON OF ARTERIOVENOUS FISTULA Left 3/41/9379   Procedure: Plication of Left Upper Arm Fistula ;  Surgeon: Waynetta Sandy, MD;  Location: [facility];  Service: Vascular;  Laterality: Left;  . SKIN GRAFT SPLIT THICKNESS LEG / FOOT Left    SKIN GRAFT SPLIT THICKNESS LEFT ARM DONOR SITE: LEFT ANTERIOR THIGH  . SKIN SPLIT GRAFT Left 07/04/2017   Procedure: SKIN GRAFT SPLIT THICKNESS LEFT ARM DONOR SITE: LEFT ANTERIOR THIGH;  Surgeon: Elam Dutch, MD;  Location: [facility];  Service: Vascular;  Laterality: Left;  . THROMBECTOMY W/ EMBOLECTOMY Left 06/05/2017   Procedure: EXPLORATION OF LEFT ARM FOR BLEEDING; OVERSEWED PROXIMAL FISTULA;  Surgeon: Angelia Mould, MD;  Location: [facility];  Service: Vascular;  Laterality: Left;  . WOUND EXPLORATION Left 06/03/2017   Procedure: WOUND EXPLORATION WITH WOUND VAC APPLICATION TO LEFT ARM;  Surgeon: Angelia Mould, MD;  Location: [facility];  Service: Vascular;  Laterality: Left;        Home Medications    Prior to Admission medications   Medication Sig Start Date End Date Taking? Authorizing Provider  albuterol (VENTOLIN HFA) 108 (90 Base) MCG/ACT inhaler Inhale 2 puffs into the lungs every 6 (six) hours as needed for wheezing or shortness of breath.    [provider]    aspirin 81 MG EC tablet Take 1 tablet (81 mg total) by mouth daily. Patient not taking: Reported on 07/22/2018 03/12/18   Geradine Girt, DO  gabapentin (NEURONTIN) 100 MG capsule Take 100 mg by mouth 2 (two) times daily. 12/13/17   [provider]  hydrALAZINE (APRESOLINE) 100 MG tablet Take 1 tablet (100 mg total) by mouth 2 (two) times daily as needed. Taking after HD on Dialysis days Patient taking differently: Take 100 mg by mouth 2 (two) times daily as needed (high blood pressure). If needed on Monday, Wednesday, Friday take after dialysis 03/11/18   Geradine Girt, DO  lactulose ([place]au) 10  GM/15ML solution Take 45 mLs (30 g total) by mouth 2 (two) times daily. 03/11/18   Geradine Girt, DO  metoprolol tartrate (LOPRESSOR) 25 MG tablet Take 1 tablet (25 mg total) by mouth 2 (two) times daily. 03/11/18   Geradine Girt, DO  pantoprazole (PROTONIX) 40 MG tablet Take 1 tablet (40 mg total) by mouth daily at 12 noon. Patient taking differently: Take 40 mg by mouth See admin instructions. Take one tablet (40 mg) by mouth in the morning on Sunday, Tuesday, Thursday, Saturday, take one tablet (40 mg) after dialysis on Monday, Wednesday, Friday. May also take one tablet (40 mg) in the evening as needed for acid reflux. 03/12/18   Geradine Girt, DO  sevelamer carbonate (RENVELA) 800 MG tablet Take 4 tablets with meals  And 2 tablets twice daily with snacks Patient taking differently: Take 2,400-3,200 mg by mouth See admin instructions. Take 3 - 4 tablets (2400 - 3200 mg) up to five times daily with meals or snacks 01/22/18   Patrecia Pour, MD  traZODone (DESYREL) 50 MG tablet Take 50 mg by mouth at bedtime as needed for sleep.     [provider]    Family History Family History  Problem Relation Age of Onset  . Heart disease Mother   . Lung cancer Mother   . Heart disease Father   . Malignant hyperthermia Father   . COPD Father   . Throat cancer Sister   . Esophageal cancer  Sister   . Hypertension Other   . COPD Other   . Colon cancer Neg Hx   . Colon polyps Neg Hx   . Rectal cancer Neg Hx   . Stomach cancer Neg Hx     Social History Social History   Tobacco Use  . Smoking status: Current Every Day Smoker    Packs/day: 0.50    Years: 43.00    Pack years: 21.50    Types: Cigarettes    Start date: 08/13/1973  . Smokeless tobacco: Never Used  Substance Use Topics  . Alcohol use: Not Currently    Frequency: Never    Comment: quit drinking in 2017  . Drug use: Yes    Types: Marijuana    Comment: quit in 2017"     Allergies   Morphine and related; Aspirin; Clonidine derivatives; Tramadol; and Tylenol [acetaminophen]   Review of Systems Review of Systems  All other systems reviewed and are negative.    Physical Exam Updated Vital Signs BP (!) 186/87   Pulse 85   Temp (!) 102.9 F (39.4 C) (Oral)   Resp (!) 21   SpO2 100%   Physical Exam Vitals signs and nursing note reviewed.  Constitutional:      Appearance: He is well-developed.  HENT:     Head: Normocephalic and atraumatic.  Neck:     Musculoskeletal: Normal range of motion.  Cardiovascular:     Rate and Rhythm: Regular rhythm. Tachycardia present.     Heart sounds: Normal heart sounds.  Pulmonary:     Effort: Respiratory distress present.     Breath sounds: No stridor. No wheezing or rhonchi.     Comments: Tachypnea Abdominal:     General: There is no distension.     Palpations: Abdomen is soft.     Tenderness: There is no abdominal tenderness.  Musculoskeletal: Normal range of motion.  Skin:    General: Skin is warm and dry.  Neurological:     Mental Status:  He is alert and oriented to person, place, and time.  Psychiatric:        Judgment: Judgment normal.      ED Treatments / Results  Labs (all labs ordered are listed, but only abnormal results are displayed) Labs Reviewed  CBC - Abnormal; Notable for the following components:      Result Value   RBC  4.11 (*)    Hemoglobin 10.9 (*)    HCT 32.4 (*)    MCV 78.8 (*)    RDW 16.7 (*)    Platelets 85 (*)    All other components within normal limits  COMPREHENSIVE METABOLIC PANEL - Abnormal; Notable for the following components:   Sodium 131 (*)    Chloride 89 (*)    Creatinine, Ser 6.75 (*)    Calcium 8.5 (*)    Total Protein 8.7 (*)    Total Bilirubin 1.8 (*)    GFR calc non Af Amer 8 (*)    GFR calc Af Amer 10 (*)    Anion gap 16 (*)    All other components within normal limits  INFLUENZA PANEL BY PCR (TYPE A & B) - Abnormal; Notable for the following components:   Influenza A By PCR POSITIVE (*)    All other components within normal limits  CULTURE, BLOOD (ROUTINE X 2)  CULTURE, BLOOD (ROUTINE X 2)  LACTIC ACID, PLASMA    EKG EKG Interpretation  Date/Time:  Tuesday September 12 2018 08:46:57 EST Ventricular Rate:  94 PR Interval:    QRS Duration: 172 QT Interval:  415 QTC Calculation: 519 R Axis:   -78 Text Interpretation:  Sinus rhythm Left bundle branch block No significant change was found Confirmed by Jola Schmidt [phone]) on 09/12/2018 9:38:10 AM   Radiology Dg Chest Portable 1 View  Result Date: 09/12/2018 CLINICAL DATA:  Shortness of breath. EXAM: PORTABLE CHEST 1 VIEW COMPARISON:  03/10/2018 FINDINGS: There is chronic cardiomegaly. Aortic atherosclerosis. Pulmonary vascularity is at the upper limits of normal. Chronic slight elevation of the left hemidiaphragm. No infiltrates or effusions. No acute bone abnormality. Extensive calcification in the carotid arteries. IMPRESSION: No acute abnormalities. Chronic cardiomegaly with pulmonary vascularity at the upper limits of normal. Aortic Atherosclerosis (ICD10-I70.0). Extensive calcification in the carotid arteries. Electronically Signed   By: Lorriane Shire M.D.   On: 09/12/2018 09:29    Procedures .Critical Care Performed by: Jola Schmidt, MD Authorized by: Jola Schmidt, MD   Critical care provider statement:     Critical care time (minutes):  40   Critical care was time spent personally by me on the following activities:  Discussions with consultants, evaluation of patient's response to treatment, examination of patient, ordering and performing treatments and interventions, ordering and review of laboratory studies, ordering and review of radiographic studies, pulse oximetry, re-evaluation of patient's condition, obtaining history from patient or surrogate and review of old charts   (including critical care time)  Medications Ordered in ED Medications  metroNIDAZOLE (FLAGYL) IVPB 500 mg (500 mg Intravenous New Bag/Given 09/12/18 1100)  vancomycin (VANCOCIN) 1,500 mg in sodium chloride 0.9 % 500 mL IVPB (has no administration in time range)  vancomycin (VANCOCIN) IVPB 750 mg/150 ml premix (has no administration in time range)  ceFEPIme (MAXIPIME) 2 g in sodium chloride 0.9 % 100 mL IVPB (has no administration in time range)  oseltamivir (TAMIFLU) capsule 75 mg (has no administration in time range)  ceFEPIme (MAXIPIME) 2 g in sodium chloride 0.9 % 100 mL IVPB (0  g Intravenous Stopped 09/12/18 1057)  ibuprofen (ADVIL,MOTRIN) tablet 600 mg (600 mg Oral Given 09/12/18 1100)     Initial Impression / Assessment and Plan / ED Course  I have reviewed the triage vital signs and the nursing notes.  Pertinent labs & imaging results that were available during my care of the patient were reviewed by me and considered in my medical decision making (see chart for details).        Increased work of breathing on arrival.  Febrile illness.  102.9.  Placed on BiPAP on arrival.  No obvious rhonchi.  Broad-spectrum antibiotics given.  To suggest cellulitis  11:59 AM Breathing improving on BiPAP.  No obvious infiltrate seen on chest x-ray.  Occult pneumonia is still likely given increased work of breathing and fever.  He has influenza positive here in the emergency department.  He will be started on Tamiflu.  Admission the  hospital.  Final Clinical Impressions(s) / ED Diagnoses   Final diagnoses:  Acute respiratory failure, unspecified whether with hypoxia or hypercapnia [facility])  Influenza    ED Discharge Orders    None       Jola Schmidt, MD 09/12/18 1159

## 2018-09-12 NOTE — Consult Note (Addendum)
[facility] Renal Consultation Note    Indication for Consultation:  Management of ESRD/hemodialysis; anemia, hypertension/volume and secondary hyperparathyroidism PCP: [name], MD  HPI: [name] is a 58 y.o. male with ESRD secondary to HTN, Hep B and C + , polysubstance abuse, hx of GIB, noncompliance, liver disease with multiple paracenteses though none recently, on MWF HD at [place] presented with the onset of SOB, feeling bad with fever, myalgias and headache yesterday.  Had some diarrhea and nausea.  Uneventful dialysis Monday.  He ran nearly his full dialysis with a net UF of and was afebrile. He ran nearly his full treatment Monday with a net UF of 0.7 kg.  He is asking for something to sleep because he was awake all night and at this time he is refusing to have dialysis while in the hospital. He did not have the flu shot because " I got even sicker than this when I had the flu shot before."  He will be due for dialysis tomorrow.  Evaluation in the ED today shows elevated temp 102.9 , WBC 7.5 K 3.9 Na 131 hgb 10.9 plts 85 Influenza A+ CXR showed pulmonary vascular congestion.  Past Medical History:  Diagnosis Date  . Anemia   . Anxiety   . Arthritis    left shoulder  . Atherosclerosis of aorta ([place])   . Cardiomegaly   . Chest pain    DATE UNKNOWN, C/O PERIODICALLY  . Cocaine abuse ([place])   . COPD exacerbation ([place]) 08/17/2016  . Coronary artery disease    stent 02/22/17  . ESRD (end stage renal disease) on dialysis ([place])    "[name]; MWF" (07/04/2017)  . GERD (gastroesophageal reflux disease)    DATE UNKNOWN  . Hemorrhoids   . Hepatitis B, chronic ([place])   . Hepatitis C   . History of kidney stones   . Hyperkalemia   . Hypertension   . Kidney failure   . Metabolic bone disease    Patient denies  . Mitral stenosis   . Myocardial infarction ([place])   . Pneumonia   . Pulmonary edema   . Solitary rectal ulcer syndrome 07/2017   at [facility]  for rectal bleeding  . Tubular adenoma of colon    Past Surgical History:  Procedure Laterality Date  . A/V FISTULAGRAM Left 05/26/2017   Procedure: A/V FISTULAGRAM;  Surgeon: Conrad [place], MD;  Location: [place] [facility];  Service: Cardiovascular;  Laterality: Left;  . A/V FISTULAGRAM Right 11/18/2017   Procedure: A/V FISTULAGRAM - Right Arm;  Surgeon: Elam Dutch, MD;  Location: [place] [facility];  Service: Cardiovascular;  Laterality: Right;  . APPLICATION OF WOUND VAC Left 06/14/2017   Procedure: APPLICATION OF WOUND VAC;  Surgeon: Katha Cabal, MD;  Location: [facility];  Service: Vascular;  Laterality: Left;  . AV FISTULA PLACEMENT  2012   BELIEVED WAS PLACED IN JUNE  . AV FISTULA PLACEMENT Right 08/09/2017   Procedure: Creation Right arm ARTERIOVENOUS BRACHIOCEPOHALIC FISTULA;  Surgeon: Elam Dutch, MD;  Location: [facility] OR;  Service: Vascular;  Laterality: Right;  . AV FISTULA PLACEMENT Right 11/22/2017   Procedure: INSERTION OF ARTERIOVENOUS (AV) GORE-TEX GRAFT RIGHT UPPER ARM;  Surgeon: Elam Dutch, MD;  Location: [place];  Service: Vascular;  Laterality: Right;  . BIOPSY  01/25/2018   Procedure: BIOPSY;  Surgeon: Jerene Bears, MD;  Location: [facility] ENDOSCOPY;  Service: Gastroenterology;;  . COLONOSCOPY    . COLONOSCOPY  WITH PROPOFOL N/A 01/25/2018   Procedure: COLONOSCOPY WITH PROPOFOL;  Surgeon: Jerene Bears, MD;  Location: [place];  Service: Gastroenterology;  Laterality: N/A;  . CORONARY STENT INTERVENTION N/A 02/22/2017   Procedure: CORONARY STENT INTERVENTION;  Surgeon: Nigel Mormon, MD;  Location: [place] [facility];  Service: Cardiovascular;  Laterality: N/A;  . ESOPHAGOGASTRODUODENOSCOPY (EGD) WITH PROPOFOL N/A 01/25/2018   Procedure: ESOPHAGOGASTRODUODENOSCOPY (EGD) WITH PROPOFOL;  Surgeon: Jerene Bears, MD;  Location: [place];  Service: Gastroenterology;  Laterality: N/A;  . FLEXIBLE SIGMOIDOSCOPY N/A 07/15/2017   Procedure: FLEXIBLE  SIGMOIDOSCOPY;  Surgeon: Carol Ada, MD;  Location: [place];  Service: Endoscopy;  Laterality: N/A;  . HEMORRHOID BANDING    . I&D EXTREMITY Left 06/01/2017   Procedure: IRRIGATION AND DEBRIDEMENT LEFT ARM HEMATOMA WITH LIGATION OF LEFT ARM AV FISTULA;  Surgeon: Elam Dutch, MD;  Location: [place];  Service: Vascular;  Laterality: Left;  . I&D EXTREMITY Left 06/14/2017   Procedure: IRRIGATION AND DEBRIDEMENT EXTREMITY;  Surgeon: Katha Cabal, MD;  Location: [facility];  Service: Vascular;  Laterality: Left;  . INSERTION OF DIALYSIS CATHETER  05/30/2017  . INSERTION OF DIALYSIS CATHETER N/A 05/30/2017   Procedure: INSERTION OF DIALYSIS CATHETER;  Surgeon: Elam Dutch, MD;  Location: [place];  Service: Vascular;  Laterality: N/A;  . IR PARACENTESIS  08/30/2017  . IR PARACENTESIS  09/29/2017  . IR PARACENTESIS  10/28/2017  . IR PARACENTESIS  11/09/2017  . IR PARACENTESIS  11/16/2017  . IR PARACENTESIS  11/28/2017  . IR PARACENTESIS  12/01/2017  . IR PARACENTESIS  12/06/2017  . IR PARACENTESIS  01/03/2018  . IR PARACENTESIS  01/23/2018  . IR PARACENTESIS  02/07/2018  . IR PARACENTESIS  02/21/2018  . IR PARACENTESIS  03/06/2018  . IR PARACENTESIS  03/17/2018  . IR PARACENTESIS  04/04/2018  . IR RADIOLOGIST EVAL & MGMT  02/14/2018  . LEFT HEART CATH AND CORONARY ANGIOGRAPHY N/A 02/22/2017   Procedure: LEFT HEART CATH AND CORONARY ANGIOGRAPHY;  Surgeon: Nigel Mormon, MD;  Location: [place] [facility];  Service: Cardiovascular;  Laterality: N/A;  . LIGATION OF ARTERIOVENOUS  FISTULA Left 08/18/7822   Procedure: Plication of Left Arm Arteriovenous Fistula;  Surgeon: Elam Dutch, MD;  Location: [place];  Service: Vascular;  Laterality: Left;  . POLYPECTOMY    . POLYPECTOMY  01/25/2018   Procedure: POLYPECTOMY;  Surgeon: Jerene Bears, MD;  Location: [place];  Service: Gastroenterology;;  . REVISON OF ARTERIOVENOUS FISTULA Left 2/35/3614   Procedure: PLICATION OF DISTAL ANEURYSMAL  SEGEMENT OF LEFT UPPER ARM ARTERIOVENOUS FISTULA;  Surgeon: Elam Dutch, MD;  Location: [place];  Service: Vascular;  Laterality: Left;  . REVISON OF ARTERIOVENOUS FISTULA Left 4/31/5400   Procedure: Plication of Left Upper Arm Fistula ;  Surgeon: Waynetta Sandy, MD;  Location: [place];  Service: Vascular;  Laterality: Left;  . SKIN GRAFT SPLIT THICKNESS LEG / FOOT Left    SKIN GRAFT SPLIT THICKNESS LEFT ARM DONOR SITE: LEFT ANTERIOR THIGH  . SKIN SPLIT GRAFT Left 07/04/2017   Procedure: SKIN GRAFT SPLIT THICKNESS LEFT ARM DONOR SITE: LEFT ANTERIOR THIGH;  Surgeon: Elam Dutch, MD;  Location: [place];  Service: Vascular;  Laterality: Left;  . THROMBECTOMY W/ EMBOLECTOMY Left 06/05/2017   Procedure: EXPLORATION OF LEFT ARM FOR BLEEDING; OVERSEWED PROXIMAL FISTULA;  Surgeon: Angelia Mould, MD;  Location: [place];  Service: Vascular;  Laterality: Left;  . WOUND EXPLORATION Left 06/03/2017  Procedure: WOUND EXPLORATION WITH WOUND VAC APPLICATION TO LEFT ARM;  Surgeon: Angelia Mould, MD;  Location: [facility];  Service: Vascular;  Laterality: Left;   Family History  Problem Relation Age of Onset  . Heart disease Mother   . Lung cancer Mother   . Heart disease Father   . Malignant hyperthermia Father   . COPD Father   . Throat cancer Sister   . Esophageal cancer Sister   . Hypertension Other   . COPD Other   . Colon cancer Neg Hx   . Colon polyps Neg Hx   . Rectal cancer Neg Hx   . Stomach cancer Neg Hx    Social History:  reports that he has been smoking cigarettes. He started smoking about 45 years ago. He has a 21.50 pack-year smoking history. He has never used smokeless tobacco. He reports previous alcohol use. He reports current drug use. Drug: Marijuana. Allergies  Allergen Reactions  . Morphine And Related Other (See Comments)    Stomach pain  . Aspirin Other (See Comments)    STOMACH PAIN  . Clonidine Derivatives Itching  . Tramadol Itching  .  Tylenol [Acetaminophen] Nausea Only    Stomach ache   Prior to Admission medications   Medication Sig Start Date End Date Taking? Authorizing Provider  albuterol (VENTOLIN HFA) 108 (90 Base) MCG/ACT inhaler Inhale 2 puffs into the lungs every 6 (six) hours as needed for wheezing or shortness of breath.    [provider]  aspirin 81 MG EC tablet Take 1 tablet (81 mg total) by mouth daily. Patient not taking: Reported on 07/22/2018 03/12/18   Geradine Girt, DO  gabapentin (NEURONTIN) 100 MG capsule Take 100 mg by mouth 2 (two) times daily. 12/13/17   [provider]  hydrALAZINE (APRESOLINE) 100 MG tablet Take 1 tablet (100 mg total) by mouth 2 (two) times daily as needed. Taking after HD on Dialysis days Patient taking differently: Take 100 mg by mouth 2 (two) times daily as needed (high blood pressure). If needed on Monday, Wednesday, Friday take after dialysis 03/11/18   Geradine Girt, DO  lactulose (CHRONULAC) 10 GM/15ML solution Take 45 mLs (30 g total) by mouth 2 (two) times daily. 03/11/18   Geradine Girt, DO  metoprolol tartrate (LOPRESSOR) 25 MG tablet Take 1 tablet (25 mg total) by mouth 2 (two) times daily. 03/11/18   Geradine Girt, DO  pantoprazole (PROTONIX) 40 MG tablet Take 1 tablet (40 mg total) by mouth daily at 12 noon. Patient taking differently: Take 40 mg by mouth See admin instructions. Take one tablet (40 mg) by mouth in the morning on Sunday, Tuesday, Thursday, Saturday, take one tablet (40 mg) after dialysis on Monday, Wednesday, Friday. May also take one tablet (40 mg) in the evening as needed for acid reflux. 03/12/18   Geradine Girt, DO  sevelamer carbonate (RENVELA) 800 MG tablet Take 4 tablets with meals  And 2 tablets twice daily with snacks Patient taking differently: Take 2,400-3,200 mg by mouth See admin instructions. Take 3 - 4 tablets (2400 - 3200 mg) up to five times daily with meals or snacks 01/22/18   Patrecia Pour, MD  traZODone (DESYREL) 50  MG tablet Take 50 mg by mouth at bedtime as needed for sleep.     [provider]   Current Facility-Administered Medications  Medication Dose Route Frequency Provider Last Rate Last Dose  . 0.9 %  sodium chloride infusion  250  mL Intravenous PRN Merton Border, MD      . albuterol (PROVENTIL HFA;VENTOLIN HFA) 108 (90 Base) MCG/ACT inhaler 2 puff  2 puff Inhalation Q6H PRN Merton Border, MD      . aspirin EC tablet 81 mg  81 mg Oral Daily Merton Border, MD      . Derrill Memo ON 09/13/2018] ceFEPIme (MAXIPIME) 2 g in sodium chloride 0.9 % 100 mL IVPB  2 g Intravenous Q M,W,F-2000 Merton Border, MD      . gabapentin (NEURONTIN) capsule 100 mg  100 mg Oral BID Merton Border, MD   100 mg at 09/12/18 1410  . heparin injection 5,000 Units  5,000 Units Subcutaneous BID Merton Border, MD      . heparin injection 5,000 Units  5,000 Units Subcutaneous BID Merton Border, MD      . lactulose ([place]) 10 GM/15ML solution 30 g  30 g Oral BID Merton Border, MD      . metoprolol tartrate (LOPRESSOR) tablet 25 mg  25 mg Oral BID Merton Border, MD   25 mg at 09/12/18 1411  . metroNIDAZOLE (FLAGYL) IVPB 500 mg  500 mg Intravenous Q8H Merton Border, MD   Stopped at 09/12/18 1222  . ondansetron (ZOFRAN) tablet 4 mg  4 mg Oral Q6H PRN Merton Border, MD       Or  . ondansetron (ZOFRAN) injection 4 mg  4 mg Intravenous Q6H PRN Merton Border, MD      . oseltamivir (TAMIFLU) capsule 75 mg  75 mg Oral BID Merton Border, MD      . pantoprazole (PROTONIX) EC tablet 40 mg  40 mg Oral Q1200 Merton Border, MD   40 mg at 09/12/18 1410  . sevelamer carbonate (RENVELA) tablet 2,400-3,200 mg  2,400-3,200 mg Oral See admin instructions Merton Border, MD      . sodium chloride flush (NS) 0.9 % injection 3 mL  3 mL Intravenous Q12H Merton Border, MD      . sodium chloride flush (NS) 0.9 % injection 3 mL  3 mL Intravenous PRN Merton Border, MD      . traZODone (DESYREL) tablet 50 mg  50 mg Oral QHS PRN Merton Border, MD      . Derrill Memo ON 09/13/2018] vancomycin  (VANCOCIN) IVPB 750 mg/150 ml premix  750 mg Intravenous Q M,W,F-HD Merton Border, MD       Current Outpatient Medications  Medication Sig Dispense Refill  . albuterol (VENTOLIN HFA) 108 (90 Base) MCG/ACT inhaler Inhale 2 puffs into the lungs every 6 (six) hours as needed for wheezing or shortness of breath.    Marland Kitchen aspirin 81 MG EC tablet Take 1 tablet (81 mg total) by mouth daily. (Patient not taking: Reported on 07/22/2018) 60 tablet 0  . gabapentin (NEURONTIN) 100 MG capsule Take 100 mg by mouth 2 (two) times daily.  0  . hydrALAZINE (APRESOLINE) 100 MG tablet Take 1 tablet (100 mg total) by mouth 2 (two) times daily as needed. Taking after HD on Dialysis days (Patient taking differently: Take 100 mg by mouth 2 (two) times daily as needed (high blood pressure). If needed on Monday, Wednesday, Friday take after dialysis) 30 tablet 0  . lactulose (CHRONULAC) 10 GM/15ML solution Take 45 mLs (30 g total) by mouth 2 (two) times daily. 1892 mL 0  . metoprolol tartrate (LOPRESSOR) 25 MG tablet Take 1 tablet (25 mg total) by mouth 2 (two) times daily. 60 tablet 0  . pantoprazole (PROTONIX) 40 MG tablet Take 1  tablet (40 mg total) by mouth daily at 12 noon. (Patient taking differently: Take 40 mg by mouth See admin instructions. Take one tablet (40 mg) by mouth in the morning on Sunday, Tuesday, Thursday, Saturday, take one tablet (40 mg) after dialysis on Monday, Wednesday, Friday. May also take one tablet (40 mg) in the evening as needed for acid reflux.) 30 tablet 0  . sevelamer carbonate (RENVELA) 800 MG tablet Take 4 tablets with meals  And 2 tablets twice daily with snacks (Patient taking differently: Take 2,400-3,200 mg by mouth See admin instructions. Take 3 - 4 tablets (2400 - 3200 mg) up to five times daily with meals or snacks)    . traZODone (DESYREL) 50 MG tablet Take 50 mg by mouth at bedtime as needed for sleep.      Labs: Basic Metabolic Panel: Recent Labs  Lab 09/12/18 0851  NA 131*  K 3.9   CL 89*  CO2 26  GLUCOSE 81  BUN 19  CREATININE 6.75*  CALCIUM 8.5*   Liver Function Tests: Recent Labs  Lab 09/12/18 0851  AST 39  ALT 19  ALKPHOS 125  BILITOT 1.8*  PROT 8.7*  ALBUMIN 4.6   CBC: Recent Labs  Lab 09/12/18 0851  WBC 7.5  HGB 10.9*  HCT 32.4*  MCV 78.8*  PLT 85*   Cardiac Enzymes: No results for input(s): CKTOTAL, CKMB, CKMBINDEX, TROPONINI in the last 168 hours. CBG: No results for input(s): GLUCAP in the last 168 hours. Iron Studies: No results for input(s): IRON, TIBC, TRANSFERRIN, FERRITIN in the last 72 hours. Studies/Results: Dg Chest Portable 1 View  Result Date: 09/12/2018 CLINICAL DATA:  Shortness of breath. EXAM: PORTABLE CHEST 1 VIEW COMPARISON:  03/10/2018 FINDINGS: There is chronic cardiomegaly. Aortic atherosclerosis. Pulmonary vascularity is at the upper limits of normal. Chronic slight elevation of the left hemidiaphragm. No infiltrates or effusions. No acute bone abnormality. Extensive calcification in the carotid arteries. IMPRESSION: No acute abnormalities. Chronic cardiomegaly with pulmonary vascularity at the upper limits of normal. Aortic Atherosclerosis (ICD10-I70.0). Extensive calcification in the carotid arteries. Electronically Signed   By: Lorriane Shire M.D.   On: 09/12/2018 09:29   ROS: As per HPI otherwise negative.  Physical Exam: Vitals:   09/12/18 1230 09/12/18 1245 09/12/18 1400 09/12/18 1415  BP: (!) 179/87 (!) 175/78 (!) 170/87   Pulse:   94 92  Resp:  (!) 28 (!) 26 (!) 28  Temp:    (!) 100.5 F (38.1 C)  TempSrc:    Oral  SpO2:  100% 100% 96%  Weight:      Height:         General: thin male on biPAP SOB  Neck: Supple.  Lungs: coarse BS Heart: irreg irreg.  Abdomen: distended + ascites  M-S:  Extreme muscle wasting Lower extremities: no LE edema Neuro: A & O  X 3. Moves all extremities spontaneously. Dialysis Access: right upper AVGG + bruit  Dialysis Orders:  [facility] MWF 4 hr EDW 70.5 400/600 NO HEPARIN  2K 2 Ca ISOLATION due to Hep B status, right upper AVGG Parsabiv  7.5 venofer 100 through 3/13/ Mircera 60 q 2 weeks - last dose Mircera 100 2/18 calctiriol 2.25  Recent labs: hgb 11.1 iPTH 55 down from 100s last month  Assessment/Plan: 1. Sepsis secondary to influenza A-cultures drawn - empiric antibiotics/tamiflu per primary 2. ESRD -  MWF - offer HD tomorrow - as of today he says he will refuse - reassess in the am -  avoid low K due to afib history  3. Hypertension/volume  - needs volume down some - problems with BP drops on HD Monday-   4. Anemia  - hgb 10.9 - consistent with outpatient hgb - continue esa - hold on Fe repletion for now- keep aranesp at 100 for now  5. Metabolic bone disease -  Continue calcitriol - lower dose and hold parsabiv due to recent low iPTH of 55 - no binders - outpt P has been low 6. Nutrition - low in part due to liver disease- diet + supplements/vit 7. Hep B/C + needs isolation dialysis-has significant ascites - no recent paracentesis. 8. Afib - EKG - on admission showed NSR but had rate controlled afib on tele 9. Thrombocytopenia - plts stable  10.  Hx GIB - would use heparin judiciously - no heparin HD  11. Polysubstance abuse  Myriam Jacobson, PA-C [place] [phone] 09/12/2018, 2:26 PM   Pt seen, examined and agree w A/P as above.  [facility] 09/12/2018, 4:38 PM

## 2018-09-12 NOTE — Progress Notes (Signed)
Patient arrived and admitted to 5W12. On arrival he refused to wear bipap, stating he needed to go to the bathroom first. He was sating 83% on RA and was placed on 4L O2 via nasal cannula. Patient now resting in bed on 4L, no apparent distress and statting 99% MD Hijazi made aware, ok to keep him on 4L and use Bipap as needed. Will continue to monitor closely.

## 2018-09-12 NOTE — Progress Notes (Signed)
Patient remains on 4 lpmNC. BIPAP on stand by at bedside. No distress noted.

## 2018-09-12 NOTE — Progress Notes (Signed)
Pharmacy Antibiotic Note  [name] is a 58 y.o. male admitted on 09/12/2018.  Pharmacy has been consulted for empiric vancomycin and cefepime dosing. WBC and lactic acid are WNL. Pt with history of ESRD on HD.   Plan: Vancomycin 1500mg  IV x 1 then 750mg  post-HD Cefepime 2gm IV x 1 then 2gm post-HD F/u renal plans, C&S, clinical status and pre-HD vanc level as needed  No data recorded.  No results for input(s): WBC, CREATININE, LATICACIDVEN, VANCOTROUGH, VANCOPEAK, VANCORANDOM, GENTTROUGH, GENTPEAK, GENTRANDOM, TOBRATROUGH, TOBRAPEAK, TOBRARND, AMIKACINPEAK, AMIKACINTROU, AMIKACIN in the last 168 hours.  CrCl cannot be calculated (Patient's most recent lab result is older than the maximum 21 days allowed.).    Allergies  Allergen Reactions  . Morphine And Related Other (See Comments)    Stomach pain  . Aspirin Other (See Comments)    STOMACH PAIN  . Clonidine Derivatives Itching  . Tramadol Itching  . Tylenol [Acetaminophen] Nausea Only    Stomach ache    Antimicrobials this admission: Vanc 3/3>> Cefepime 3/3>> Flagyl 3/3>>  Dose adjustments this admission: N/A  Microbiology results: Pending  Thank you for allowing pharmacy to be a part of this patient's care.  Chibueze Beasley, Rande Lawman 09/12/2018 9:04 AM

## 2018-09-12 NOTE — Progress Notes (Signed)
Pt placed on Bipap at this time per MD order.  Pt tolerating well, RT will monitor

## 2018-09-12 NOTE — Progress Notes (Signed)
RT NOTE: RT transported patient from ED42 to 7V81 with no complications. Upon arrival patient took off bipap mask and has refused for RT to put bipap mask back on him. Patient was satting 85% on room air, RT placed patient on 4L [place] satting 92%. RN in room during this time and is aware. RT will continue to monitor as needed.

## 2018-09-12 NOTE — H&P (Signed)
[facility]                                                                                    Patient Demographics  [name], is a 58 y.o. male  CSN: [id number]  MRN: [id number]  DOB - 17-Dec-1960  Admit Date - 09/12/2018  Outpatient Primary MD for the patient is [name], MD   With History of -  Past Medical History:  Diagnosis Date  . Anemia   . Anxiety   . Arthritis    left shoulder  . Atherosclerosis of aorta ([place])   . Cardiomegaly   . Chest pain    DATE UNKNOWN, C/O PERIODICALLY  . Cocaine abuse ([place])   . COPD exacerbation ([place]) 08/17/2016  . Coronary artery disease    stent 02/22/17  . ESRD (end stage renal disease) on dialysis ([place])    "[name]; MWF" (07/04/2017)  . GERD (gastroesophageal reflux disease)    DATE UNKNOWN  . Hemorrhoids   . Hepatitis B, chronic ([place])   . Hepatitis C   . History of kidney stones   . Hyperkalemia   . Hypertension   . Kidney failure   . Metabolic bone disease    Patient denies  . Mitral stenosis   . Myocardial infarction ([place])   . Pneumonia   . Pulmonary edema   . Solitary rectal ulcer syndrome 07/2017   at flex sig for rectal bleeding  . Tubular adenoma of colon       Past Surgical History:  Procedure Laterality Date  . A/V FISTULAGRAM Left 05/26/2017   Procedure: A/V FISTULAGRAM;  Surgeon: Conrad Blair, MD;  Location: [place] [facility];  Service: Cardiovascular;  Laterality: Left;  . A/V FISTULAGRAM Right 11/18/2017   Procedure: A/V FISTULAGRAM - Right Arm;  Surgeon: Elam Dutch, MD;  Location: [place] [facility];  Service: Cardiovascular;  Laterality: Right;  . APPLICATION OF WOUND VAC Left 06/14/2017   Procedure: APPLICATION OF WOUND VAC;  Surgeon: Katha Cabal, MD;  Location: [facility];  Service: Vascular;  Laterality: Left;  . AV FISTULA PLACEMENT  2012   BELIEVED WAS PLACED IN JUNE  . AV FISTULA PLACEMENT Right 08/09/2017   Procedure: Creation Right arm ARTERIOVENOUS  BRACHIOCEPOHALIC FISTULA;  Surgeon: Elam Dutch, MD;  Location: [facility] OR;  Service: Vascular;  Laterality: Right;  . AV FISTULA PLACEMENT Right 11/22/2017   Procedure: INSERTION OF ARTERIOVENOUS (AV) GORE-TEX GRAFT RIGHT UPPER ARM;  Surgeon: Elam Dutch, MD;  Location: [place];  Service: Vascular;  Laterality: Right;  . BIOPSY  01/25/2018   Procedure: BIOPSY;  Surgeon: Jerene Bears, MD;  Location: [facility] ENDOSCOPY;  Service: Gastroenterology;;  . COLONOSCOPY    . COLONOSCOPY WITH PROPOFOL N/A 01/25/2018   Procedure: COLONOSCOPY WITH PROPOFOL;  Surgeon: Jerene Bears, MD;  Location: [place];  Service: Gastroenterology;  Laterality: N/A;  . CORONARY STENT INTERVENTION N/A 02/22/2017   Procedure: CORONARY STENT INTERVENTION;  Surgeon: Nigel Mormon, MD;  Location: [place] [facility];  Service: Cardiovascular;  Laterality: N/A;  . ESOPHAGOGASTRODUODENOSCOPY (EGD) WITH PROPOFOL N/A 01/25/2018   Procedure: ESOPHAGOGASTRODUODENOSCOPY (EGD) WITH PROPOFOL;  Surgeon: Jerene Bears, MD;  Location: [facility] ENDOSCOPY;  Service: Gastroenterology;  Laterality: N/A;  . FLEXIBLE SIGMOIDOSCOPY N/A 07/15/2017   Procedure: FLEXIBLE SIGMOIDOSCOPY;  Surgeon: Carol Ada, MD;  Location: [place];  Service: Endoscopy;  Laterality: N/A;  . HEMORRHOID BANDING    . I&D EXTREMITY Left 06/01/2017   Procedure: IRRIGATION AND DEBRIDEMENT LEFT ARM HEMATOMA WITH LIGATION OF LEFT ARM AV FISTULA;  Surgeon: Elam Dutch, MD;  Location: [place];  Service: Vascular;  Laterality: Left;  . I&D EXTREMITY Left 06/14/2017   Procedure: IRRIGATION AND DEBRIDEMENT EXTREMITY;  Surgeon: Katha Cabal, MD;  Location: [facility];  Service: Vascular;  Laterality: Left;  . INSERTION OF DIALYSIS CATHETER  05/30/2017  . INSERTION OF DIALYSIS CATHETER N/A 05/30/2017   Procedure: INSERTION OF DIALYSIS CATHETER;  Surgeon: Elam Dutch, MD;  Location: [place];  Service: Vascular;  Laterality: N/A;  . IR PARACENTESIS  08/30/2017  . IR  PARACENTESIS  09/29/2017  . IR PARACENTESIS  10/28/2017  . IR PARACENTESIS  11/09/2017  . IR PARACENTESIS  11/16/2017  . IR PARACENTESIS  11/28/2017  . IR PARACENTESIS  12/01/2017  . IR PARACENTESIS  12/06/2017  . IR PARACENTESIS  01/03/2018  . IR PARACENTESIS  01/23/2018  . IR PARACENTESIS  02/07/2018  . IR PARACENTESIS  02/21/2018  . IR PARACENTESIS  03/06/2018  . IR PARACENTESIS  03/17/2018  . IR PARACENTESIS  04/04/2018  . IR RADIOLOGIST EVAL & MGMT  02/14/2018  . LEFT HEART CATH AND CORONARY ANGIOGRAPHY N/A 02/22/2017   Procedure: LEFT HEART CATH AND CORONARY ANGIOGRAPHY;  Surgeon: Nigel Mormon, MD;  Location: [place] [facility];  Service: Cardiovascular;  Laterality: N/A;  . LIGATION OF ARTERIOVENOUS  FISTULA Left 10/12/5954   Procedure: Plication of Left Arm Arteriovenous Fistula;  Surgeon: Elam Dutch, MD;  Location: [place];  Service: Vascular;  Laterality: Left;  . POLYPECTOMY    . POLYPECTOMY  01/25/2018   Procedure: POLYPECTOMY;  Surgeon: Jerene Bears, MD;  Location: [place];  Service: Gastroenterology;;  . REVISON OF ARTERIOVENOUS FISTULA Left 3/87/5643   Procedure: PLICATION OF DISTAL ANEURYSMAL SEGEMENT OF LEFT UPPER ARM ARTERIOVENOUS FISTULA;  Surgeon: Elam Dutch, MD;  Location: [place];  Service: Vascular;  Laterality: Left;  . REVISON OF ARTERIOVENOUS FISTULA Left 10/08/5186   Procedure: Plication of Left Upper Arm Fistula ;  Surgeon: Waynetta Sandy, MD;  Location: [place];  Service: Vascular;  Laterality: Left;  . SKIN GRAFT SPLIT THICKNESS LEG / FOOT Left    SKIN GRAFT SPLIT THICKNESS LEFT ARM DONOR SITE: LEFT ANTERIOR THIGH  . SKIN SPLIT GRAFT Left 07/04/2017   Procedure: SKIN GRAFT SPLIT THICKNESS LEFT ARM DONOR SITE: LEFT ANTERIOR THIGH;  Surgeon: Elam Dutch, MD;  Location: [place];  Service: Vascular;  Laterality: Left;  . THROMBECTOMY W/ EMBOLECTOMY Left 06/05/2017   Procedure: EXPLORATION OF LEFT ARM FOR BLEEDING; OVERSEWED PROXIMAL FISTULA;   Surgeon: Angelia Mould, MD;  Location: [place];  Service: Vascular;  Laterality: Left;  . WOUND EXPLORATION Left 06/03/2017   Procedure: WOUND EXPLORATION WITH WOUND VAC APPLICATION TO LEFT ARM;  Surgeon: Angelia Mould, MD;  Location: [place];  Service: Vascular;  Laterality: Left;    in for   Chief Complaint  Patient presents with  . Respiratory Distress  . flu symptoms     HPI  Jonh Mcqueary  is a 58 y.o. male, with past medical history significant for ESRD on MWF hemodialysis, presenting  with fever and chills with nausea and vomiting of 1 day duration.  According to the emergency room records, patient was dialyzed yesterday without complications and had some bleeding from his dialysis site on 3 1 treated in our emergency room. In the emergency room the patient was noted to have flu A and he was in respiratory distress so BiPAP was placed.  Patient was obtunded    Review of Systems    Could not review due to patient's condition.   Social History Social History   Tobacco Use  . Smoking status: Current Every Day Smoker    Packs/day: 0.50    Years: 43.00    Pack years: 21.50    Types: Cigarettes    Start date: 08/13/1973  . Smokeless tobacco: Never Used  Substance Use Topics  . Alcohol use: Not Currently    Frequency: Never    Comment: quit drinking in 2017     Family History Family History  Problem Relation Age of Onset  . Heart disease Mother   . Lung cancer Mother   . Heart disease Father   . Malignant hyperthermia Father   . COPD Father   . Throat cancer Sister   . Esophageal cancer Sister   . Hypertension Other   . COPD Other   . Colon cancer Neg Hx   . Colon polyps Neg Hx   . Rectal cancer Neg Hx   . Stomach cancer Neg Hx      Prior to Admission medications   Medication Sig Start Date End Date Taking? Authorizing Provider  albuterol (VENTOLIN HFA) 108 (90 Base) MCG/ACT inhaler Inhale 2 puffs into the lungs every 6 (six) hours as needed  for wheezing or shortness of breath.    [provider]  aspirin 81 MG EC tablet Take 1 tablet (81 mg total) by mouth daily. Patient not taking: Reported on 07/22/2018 03/12/18   Geradine Girt, DO  gabapentin (NEURONTIN) 100 MG capsule Take 100 mg by mouth 2 (two) times daily. 12/13/17   [provider]  hydrALAZINE (APRESOLINE) 100 MG tablet Take 1 tablet (100 mg total) by mouth 2 (two) times daily as needed. Taking after HD on Dialysis days Patient taking differently: Take 100 mg by mouth 2 (two) times daily as needed (high blood pressure). If needed on Monday, Wednesday, Friday take after dialysis 03/11/18   Geradine Girt, DO  lactulose (CHRONULAC) 10 GM/15ML solution Take 45 mLs (30 g total) by mouth 2 (two) times daily. 03/11/18   Geradine Girt, DO  metoprolol tartrate (LOPRESSOR) 25 MG tablet Take 1 tablet (25 mg total) by mouth 2 (two) times daily. 03/11/18   Geradine Girt, DO  pantoprazole (PROTONIX) 40 MG tablet Take 1 tablet (40 mg total) by mouth daily at 12 noon. Patient taking differently: Take 40 mg by mouth See admin instructions. Take one tablet (40 mg) by mouth in the morning on Sunday, Tuesday, Thursday, Saturday, take one tablet (40 mg) after dialysis on Monday, Wednesday, Friday. May also take one tablet (40 mg) in the evening as needed for acid reflux. 03/12/18   Geradine Girt, DO  sevelamer carbonate (RENVELA) 800 MG tablet Take 4 tablets with meals  And 2 tablets twice daily with snacks Patient taking differently: Take 2,400-3,200 mg by mouth See admin instructions. Take 3 - 4 tablets (2400 - 3200 mg) up to five times daily with meals or snacks 01/22/18   Patrecia Pour, MD  traZODone (DESYREL) 50 MG  tablet Take 50 mg by mouth at bedtime as needed for sleep.     [provider]    Allergies  Allergen Reactions  . Morphine And Related Other (See Comments)    Stomach pain  . Aspirin Other (See Comments)    STOMACH PAIN  . Clonidine Derivatives  Itching  . Tramadol Itching  . Tylenol [Acetaminophen] Nausea Only    Stomach ache    Physical Exam  Vitals  Blood pressure (!) 171/80, pulse 94, temperature (!) 102.9 F (39.4 C), temperature source Oral, resp. rate (!) 28, height 5\' 9"  (1.753 m), weight 72.6 kg, SpO2 100 %.   1. General lethargic  2.  Unable to obtain due to to obtundation.  3.  Unable to obtain due to obtundation.  4. Ears and Eyes appear Normal, Conjunctivae clear, BiPAP on  5. Supple Neck, No JVD, No cervical lymphadenopathy appriciated, No Carotid Bruits.  6. Symmetrical Chest wall movement, Good air movement bilaterally, CTAB.  7. RRR, No Gallops, Rubs or Murmurs, No Parasternal Heave.  8. Positive Bowel Sounds, Abdomen Soft, Non tender, .  9.  No Cyanosis, Normal Skin Turgor, No Skin Rash or Bruise.  10. Good muscle tone,  joints appear normal , no effusions, Normal ROM.    Data Review  CBC Recent Labs  Lab 09/12/18 0851  WBC 7.5  HGB 10.9*  HCT 32.4*  PLT 85*  MCV 78.8*  MCH 26.5  MCHC 33.6  RDW 16.7*   ------------------------------------------------------------------------------------------------------------------  Chemistries  Recent Labs  Lab 09/12/18 0851  NA 131*  K 3.9  CL 89*  CO2 26  GLUCOSE 81  BUN 19  CREATININE 6.75*  CALCIUM 8.5*  AST 39  ALT 19  ALKPHOS 125  BILITOT 1.8*   ------------------------------------------------------------------------------------------------------------------ estimated creatinine clearance is 12.2 mL/min (A) (by C-G formula based on SCr of 6.75 mg/dL (H)). ------------------------------------------------------------------------------------------------------------------ No results for input(s): TSH, T4TOTAL, T3FREE, THYROIDAB in the last 72 hours.  Invalid input(s): FREET3   Coagulation profile No results for input(s): INR, PROTIME in the last 168  hours. ------------------------------------------------------------------------------------------------------------------- No results for input(s): DDIMER in the last 72 hours. -------------------------------------------------------------------------------------------------------------------  Cardiac Enzymes No results for input(s): CKMB, TROPONINI, MYOGLOBIN in the last 168 hours.  Invalid input(s): CK ------------------------------------------------------------------------------------------------------------------ Invalid input(s): POCBNP   ---------------------------------------------------------------------------------------------------------------  Urinalysis    Component Value Date/Time   COLORURINE YELLOW 07/16/2011 1619   APPEARANCEUR CLEAR 07/16/2011 1619   LABSPEC 1.018 07/16/2011 1619   PHURINE 7.5 07/16/2011 1619   GLUCOSEU 250 (A) 07/16/2011 1619   HGBUR MODERATE (A) 07/16/2011 1619   BILIRUBINUR SMALL (A) 07/16/2011 1619   KETONESUR NEGATIVE 07/16/2011 1619   PROTEINUR >300 (A) 07/16/2011 1619   UROBILINOGEN 1.0 07/16/2011 1619   NITRITE NEGATIVE 07/16/2011 1619   LEUKOCYTESUR SMALL (A) 07/16/2011 1619    ----------------------------------------------------------------------------------------------------------------   Imaging results:   Dg Chest Portable 1 View  Result Date: 09/12/2018 CLINICAL DATA:  Shortness of breath. EXAM: PORTABLE CHEST 1 VIEW COMPARISON:  03/10/2018 FINDINGS: There is chronic cardiomegaly. Aortic atherosclerosis. Pulmonary vascularity is at the upper limits of normal. Chronic slight elevation of the left hemidiaphragm. No infiltrates or effusions. No acute bone abnormality. Extensive calcification in the carotid arteries. IMPRESSION: No acute abnormalities. Chronic cardiomegaly with pulmonary vascularity at the upper limits of normal. Aortic Atherosclerosis (ICD10-I70.0). Extensive calcification in the carotid arteries. Electronically Signed    By: Lorriane Shire M.D.   On: 09/12/2018 09:29    My personal review of EKG: NSR@94  bpm with LBBB  Assessment & Plan  Hypoxemic respiratory failure Secondary to flu a and pulmonary edema due to fluid retention/ESRD Continue with BiPAP  Sepsis with altered mental status Continue with IV vancomycin and Rocephin  Flu A Continue with Tamiflu  ESRD on hemodialysis MWF Nephrology on consult  LBBB be a wide-complex tachycardia  Liver cirrhosis with ascites and history of thrombocytopenia No significant change recently  A. fib in normal sinus rhythm right now    DVT Prophylaxis Heparin  AM Labs Ordered, also please review Full Orders    Code Status full  Disposition Plan: Home  Time spent in minutes : 49 minutes  Condition critical   @SIGNATURE @

## 2018-09-12 NOTE — ED Notes (Addendum)
ED TO INPATIENT HANDOFF REPORT  ED Nurse Name and Phone #: Jinny Blossom [phone]  S Name/Age/Gender [name] 58 y.o. male Room/Bed: 042C/042C  Code Status   Code Status: Full Code  Home/SNF/Other Home Patient oriented to: self, place, time and situation Is this baseline? Yes   Triage Complete: Triage complete  Chief Complaint htn/dialysis  Triage Note No notes on file   Allergies Allergies  Allergen Reactions  . Morphine And Related Other (See Comments)    Stomach pain  . Aspirin Other (See Comments)    STOMACH PAIN  . Clonidine Derivatives Itching  . Tramadol Itching  . Tylenol [Acetaminophen] Nausea Only    Stomach ache    Level of Care/Admitting Diagnosis ED Disposition    ED Disposition Condition [facility] Area: [place] [[id number]]  Level of Care: Progressive [102]  Diagnosis: Sepsis [facility]) [[id number]]  Admitting Physician: Merton Border [[id number]]  Attending Physician: Laren Everts, [place]  Estimated length of stay: past midnight tomorrow  Certification:: I certify this patient will need inpatient services for at least 2 midnights  PT Class (Do Not Modify): Inpatient [101]  PT Acc Code (Do Not Modify): Private [1]       B Medical/Surgery History Past Medical History:  Diagnosis Date  . Anemia   . Anxiety   . Arthritis    left shoulder  . Atherosclerosis of aorta ([place])   . Cardiomegaly   . Chest pain    DATE UNKNOWN, C/O PERIODICALLY  . Cocaine abuse ([place])   . COPD exacerbation ([place]) 08/17/2016  . Coronary artery disease    stent 02/22/17  . ESRD (end stage renal disease) on dialysis ([place])    "[name]; MWF" (07/04/2017)  . GERD (gastroesophageal reflux disease)    DATE UNKNOWN  . Hemorrhoids   . Hepatitis B, chronic ([place])   . Hepatitis C   . History of kidney stones   . Hyperkalemia   . Hypertension   . Kidney failure   . Metabolic bone disease    Patient denies  . Mitral stenosis   . Myocardial  infarction ([place])   . Pneumonia   . Pulmonary edema   . Solitary rectal ulcer syndrome 07/2017   at flex sig for rectal bleeding  . Tubular adenoma of colon    Past Surgical History:  Procedure Laterality Date  . A/V FISTULAGRAM Left 05/26/2017   Procedure: A/V FISTULAGRAM;  Surgeon: Conrad , MD;  Location: [place] [facility];  Service: Cardiovascular;  Laterality: Left;  . A/V FISTULAGRAM Right 11/18/2017   Procedure: A/V FISTULAGRAM - Right Arm;  Surgeon: Elam Dutch, MD;  Location: [place] [facility];  Service: Cardiovascular;  Laterality: Right;  . APPLICATION OF WOUND VAC Left 06/14/2017   Procedure: APPLICATION OF WOUND VAC;  Surgeon: Katha Cabal, MD;  Location: [facility] ORS;  Service: Vascular;  Laterality: Left;  . AV FISTULA PLACEMENT  2012   BELIEVED WAS PLACED IN JUNE  . AV FISTULA PLACEMENT Right 08/09/2017   Procedure: Creation Right arm ARTERIOVENOUS BRACHIOCEPOHALIC FISTULA;  Surgeon: Elam Dutch, MD;  Location: [facility] OR;  Service: Vascular;  Laterality: Right;  . AV FISTULA PLACEMENT Right 11/22/2017   Procedure: INSERTION OF ARTERIOVENOUS (AV) GORE-TEX GRAFT RIGHT UPPER ARM;  Surgeon: Elam Dutch, MD;  Location: [place];  Service: Vascular;  Laterality: Right;  . BIOPSY  01/25/2018   Procedure: BIOPSY;  Surgeon: Jerene Bears, MD;  Location: [place] [facility];  Service: Gastroenterology;;  . COLONOSCOPY    . COLONOSCOPY WITH PROPOFOL N/A 01/25/2018   Procedure: COLONOSCOPY WITH PROPOFOL;  Surgeon: Jerene Bears, MD;  Location: [place];  Service: Gastroenterology;  Laterality: N/A;  . CORONARY STENT INTERVENTION N/A 02/22/2017   Procedure: CORONARY STENT INTERVENTION;  Surgeon: Nigel Mormon, MD;  Location: [place] [facility] LAB;  Service: Cardiovascular;  Laterality: N/A;  . ESOPHAGOGASTRODUODENOSCOPY (EGD) WITH PROPOFOL N/A 01/25/2018   Procedure: ESOPHAGOGASTRODUODENOSCOPY (EGD) WITH PROPOFOL;  Surgeon: Jerene Bears, MD;  Location: [place];   Service: Gastroenterology;  Laterality: N/A;  . FLEXIBLE SIGMOIDOSCOPY N/A 07/15/2017   Procedure: FLEXIBLE SIGMOIDOSCOPY;  Surgeon: Carol Ada, MD;  Location: [place];  Service: Endoscopy;  Laterality: N/A;  . HEMORRHOID BANDING    . I&D EXTREMITY Left 06/01/2017   Procedure: IRRIGATION AND DEBRIDEMENT LEFT ARM HEMATOMA WITH LIGATION OF LEFT ARM AV FISTULA;  Surgeon: Elam Dutch, MD;  Location: [place];  Service: Vascular;  Laterality: Left;  . I&D EXTREMITY Left 06/14/2017   Procedure: IRRIGATION AND DEBRIDEMENT EXTREMITY;  Surgeon: Katha Cabal, MD;  Location: [facility] ORS;  Service: Vascular;  Laterality: Left;  . INSERTION OF DIALYSIS CATHETER  05/30/2017  . INSERTION OF DIALYSIS CATHETER N/A 05/30/2017   Procedure: INSERTION OF DIALYSIS CATHETER;  Surgeon: Elam Dutch, MD;  Location: [place];  Service: Vascular;  Laterality: N/A;  . IR PARACENTESIS  08/30/2017  . IR PARACENTESIS  09/29/2017  . IR PARACENTESIS  10/28/2017  . IR PARACENTESIS  11/09/2017  . IR PARACENTESIS  11/16/2017  . IR PARACENTESIS  11/28/2017  . IR PARACENTESIS  12/01/2017  . IR PARACENTESIS  12/06/2017  . IR PARACENTESIS  01/03/2018  . IR PARACENTESIS  01/23/2018  . IR PARACENTESIS  02/07/2018  . IR PARACENTESIS  02/21/2018  . IR PARACENTESIS  03/06/2018  . IR PARACENTESIS  03/17/2018  . IR PARACENTESIS  04/04/2018  . IR RADIOLOGIST EVAL & MGMT  02/14/2018  . LEFT HEART CATH AND CORONARY ANGIOGRAPHY N/A 02/22/2017   Procedure: LEFT HEART CATH AND CORONARY ANGIOGRAPHY;  Surgeon: Nigel Mormon, MD;  Location: [place] CV [facility];  Service: Cardiovascular;  Laterality: N/A;  . LIGATION OF ARTERIOVENOUS  FISTULA Left 09/09/4968   Procedure: Plication of Left Arm Arteriovenous Fistula;  Surgeon: Elam Dutch, MD;  Location: [facility];  Service: Vascular;  Laterality: Left;  . POLYPECTOMY    . POLYPECTOMY  01/25/2018   Procedure: POLYPECTOMY;  Surgeon: Jerene Bears, MD;  Location: [place];  Service:  Gastroenterology;;  . REVISON OF ARTERIOVENOUS FISTULA Left 2/63/7858   Procedure: PLICATION OF DISTAL ANEURYSMAL SEGEMENT OF LEFT UPPER ARM ARTERIOVENOUS FISTULA;  Surgeon: Elam Dutch, MD;  Location: [place];  Service: Vascular;  Laterality: Left;  . REVISON OF ARTERIOVENOUS FISTULA Left 8/50/2774   Procedure: Plication of Left Upper Arm Fistula ;  Surgeon: Waynetta Sandy, MD;  Location: [place];  Service: Vascular;  Laterality: Left;  . SKIN GRAFT SPLIT THICKNESS LEG / FOOT Left    SKIN GRAFT SPLIT THICKNESS LEFT ARM DONOR SITE: LEFT ANTERIOR THIGH  . SKIN SPLIT GRAFT Left 07/04/2017   Procedure: SKIN GRAFT SPLIT THICKNESS LEFT ARM DONOR SITE: LEFT ANTERIOR THIGH;  Surgeon: Elam Dutch, MD;  Location: [place];  Service: Vascular;  Laterality: Left;  . THROMBECTOMY W/ EMBOLECTOMY Left 06/05/2017   Procedure: EXPLORATION OF LEFT ARM FOR BLEEDING; OVERSEWED PROXIMAL FISTULA;  Surgeon: Angelia Mould, MD;  Location: [place];  Service: Vascular;  Laterality: Left;  . WOUND EXPLORATION Left 06/03/2017   Procedure: WOUND EXPLORATION WITH WOUND VAC APPLICATION TO LEFT ARM;  Surgeon: Angelia Mould, MD;  Location: [place];  Service: Vascular;  Laterality: Left;     A IV Location/Drains/Wounds Patient Lines/Drains/Airways Status   Active Line/Drains/Airways    Name:   Placement date:   Placement time:   Site:   Days:   Peripheral IV 09/12/18 Left Forearm   09/12/18    0901    Forearm   less than 1   Fistula / Graft Right Upper arm Arteriovenous vein graft   11/22/17    0836    Upper arm   294   Fistula / Graft Right Upper arm   -    -    Upper arm      Hemodialysis Catheter Right Subclavian Double-lumen   05/30/17    1734    Subclavian   470   Incision (Closed) 11/22/17 Arm Right   11/22/17    0853     294          Intake/Output Last 24 hours  Intake/Output Summary (Last 24 hours) at 09/12/2018 1645 Last data filed at 09/12/2018 1057 Gross per 24 hour  Intake 100 ml   Output -  Net 100 ml    Labs/Imaging Results for orders placed or performed during the hospital encounter of 09/12/18 (from the past 48 hour(s))  CBC     Status: Abnormal   Collection Time: 09/12/18  8:51 AM  Result Value Ref Range   WBC 7.5 4.0 - 10.5 K/uL   RBC 4.11 (L) 4.22 - 5.81 MIL/uL   Hemoglobin 10.9 (L) 13.0 - 17.0 g/dL   HCT 32.4 (L) 39.0 - 52.0 %   MCV 78.8 (L) 80.0 - 100.0 fL   MCH 26.5 26.0 - 34.0 pg   MCHC 33.6 30.0 - 36.0 g/dL   RDW 16.7 (H) 11.5 - 15.5 %   Platelets 85 (L) 150 - 400 K/uL    Comment: REPEATED TO VERIFY PLATELET COUNT CONFIRMED BY SMEAR SPECIMEN CHECKED FOR CLOTS Immature Platelet Fraction may be clinically indicated, consider ordering this additional test [id number]    nRBC 0.0 0.0 - 0.2 %    Comment: Performed at [facility], [address], [place], [place] [address]  Comprehensive metabolic panel     Status: Abnormal   Collection Time: 09/12/18  8:51 AM  Result Value Ref Range   Sodium 131 (L) 135 - 145 mmol/L   Potassium 3.9 3.5 - 5.1 mmol/L   Chloride 89 (L) 98 - 111 mmol/L   CO2 26 22 - 32 mmol/L   Glucose, Bld 81 70 - 99 mg/dL   BUN 19 6 - 20 mg/dL   Creatinine, Ser 6.75 (H) 0.61 - 1.24 mg/dL   Calcium 8.5 (L) 8.9 - 10.3 mg/dL   Total Protein 8.7 (H) 6.5 - 8.1 g/dL   Albumin 4.6 3.5 - 5.0 g/dL   AST 39 15 - 41 U/L   ALT 19 0 - 44 U/L   Alkaline Phosphatase 125 38 - 126 U/L   Total Bilirubin 1.8 (H) 0.3 - 1.2 mg/dL   GFR calc non Af Amer 8 (L) >60 mL/min   GFR calc Af Amer 10 (L) >60 mL/min   Anion gap 16 (H) 5 - 15    Comment: Performed at [facility], [place] [address] [place],  [address]  Influenza panel by PCR (type A &  B)     Status: Abnormal   Collection Time: 09/12/18  9:03 AM  Result Value Ref Range   Influenza A By PCR POSITIVE (A) NEGATIVE   Influenza B By PCR NEGATIVE NEGATIVE    Comment: (NOTE) The Xpert Xpress Flu assay is intended as an aid in the diagnosis of  influenza and should not be  used as a sole basis for treatment.  This  assay is FDA approved for nasopharyngeal swab specimens only. Nasal  washings and aspirates are unacceptable for Xpert Xpress Flu testing. Performed at [facility], [place] [address], [place], [place] [address]   Lactic acid, plasma     Status: None   Collection Time: 09/12/18  9:04 AM  Result Value Ref Range   Lactic Acid, Venous 1.2 0.5 - 1.9 mmol/L    Comment: Performed at [place] [address], [place], [place] [address]   Dg Chest Portable 1 View  Result Date: 09/12/2018 CLINICAL DATA:  Shortness of breath. EXAM: PORTABLE CHEST 1 VIEW COMPARISON:  03/10/2018 FINDINGS: There is chronic cardiomegaly. Aortic atherosclerosis. Pulmonary vascularity is at the upper limits of normal. Chronic slight elevation of the left hemidiaphragm. No infiltrates or effusions. No acute bone abnormality. Extensive calcification in the carotid arteries. IMPRESSION: No acute abnormalities. Chronic cardiomegaly with pulmonary vascularity at the upper limits of normal. Aortic Atherosclerosis (ICD10-I70.0). Extensive calcification in the carotid arteries. Electronically Signed   By: Lorriane Shire M.D.   On: 09/12/2018 09:29    Pending Labs Unresulted Labs (From admission, onward)    Start     Ordered   09/13/18 8185  Basic metabolic panel  Tomorrow morning,   R     09/12/18 1329   09/12/18 1329  HIV antibody (Routine Testing)  Once,   R     09/12/18 1329   09/12/18 0851  Blood culture (routine x 2)  BLOOD CULTURE X 2,   STAT     09/12/18 0851   Signed and Held  Renal function panel  Once,   R     Signed and Held   Signed and Held  CBC  Once,   R     Signed and Held          Vitals/Pain Today's Vitals   09/12/18 1515 09/12/18 1516 09/12/18 1545 09/12/18 1615  BP: (!) 161/74  (!) 147/73 (!) 150/72  Pulse: 100     Resp: (!) 28 (!) 29 (!) 28 (!) 27  Temp:      TempSrc:      SpO2: 100%  99%   Weight:      Height:      PainSc:         Isolation Precautions Droplet precaution  Medications Medications  metroNIDAZOLE (FLAGYL) IVPB 500 mg (0 mg Intravenous Stopped 09/12/18 1222)  vancomycin (VANCOCIN) IVPB 750 mg/150 ml premix (has no administration in time range)  ceFEPIme (MAXIPIME) 2 g in sodium chloride 0.9 % 100 mL IVPB (has no administration in time range)  aspirin EC tablet 81 mg (has no administration in time range)  metoprolol tartrate (LOPRESSOR) tablet 25 mg (25 mg Oral Given 09/12/18 1411)  traZODone (DESYREL) tablet 50 mg (has no administration in time range)  lactulose (CHRONULAC) 10 GM/15ML solution 30 g (has no administration in time range)  pantoprazole (PROTONIX) EC tablet 40 mg (40 mg Oral Given 09/12/18 1410)  sevelamer carbonate (RENVELA) tablet 2,400-3,200 mg (has no administration in time range)  gabapentin (NEURONTIN)  capsule 100 mg (100 mg Oral Given 09/12/18 1410)  albuterol (PROVENTIL HFA;VENTOLIN HFA) 108 (90 Base) MCG/ACT inhaler 2 puff (has no administration in time range)  heparin injection 5,000 Units (has no administration in time range)  sodium chloride flush (NS) 0.9 % injection 3 mL (3 mLs Intravenous Not Given 09/12/18 1412)  sodium chloride flush (NS) 0.9 % injection 3 mL (has no administration in time range)  0.9 %  sodium chloride infusion (has no administration in time range)  ondansetron (ZOFRAN) tablet 4 mg ( Oral See Alternative 09/12/18 1627)    Or  ondansetron (ZOFRAN) injection 4 mg (4 mg Intravenous Given 09/12/18 1627)  oseltamivir (TAMIFLU) capsule 75 mg (75 mg Oral Not Given 09/12/18 1412)  heparin injection 5,000 Units (has no administration in time range)  Darbepoetin Alfa (ARANESP) injection 100 mcg (has no administration in time range)  calcitRIOL (ROCALTROL) capsule 1.5 mcg (has no administration in time range)  Chlorhexidine Gluconate Cloth 2 % PADS 6 each (has no administration in time range)  ceFEPIme (MAXIPIME) 2 g in sodium chloride 0.9 % 100 mL IVPB (0 g Intravenous  Stopped 09/12/18 1057)  vancomycin (VANCOCIN) 1,500 mg in sodium chloride 0.9 % 500 mL IVPB (0 mg Intravenous Stopped 09/12/18 1515)  ibuprofen (ADVIL,MOTRIN) tablet 600 mg (600 mg Oral Given 09/12/18 1100)  oseltamivir (TAMIFLU) capsule 75 mg (75 mg Oral Given 09/12/18 1411)    Mobility walks with person assist     Focused Assessments Pulmonary Assessment Handoff:  Lung sounds: Bilateral Breath Sounds: Diminished O2 Device: Bi-PAP        R Recommendations: See Admitting Provider Note  Report given to:   Additional Notes:  Dialysis MWF, had full tx yesterday, flu positive, on bipap since arrival, IV in left FA, restricted on right side, gave IV zofran for nausea at 1630

## 2018-09-13 LAB — BASIC METABOLIC PANEL
Anion gap: 17 — ABNORMAL HIGH (ref 5–15)
BUN: 41 mg/dL — ABNORMAL HIGH (ref 6–20)
CO2: 20 mmol/L — ABNORMAL LOW (ref 22–32)
Calcium: 8 mg/dL — ABNORMAL LOW (ref 8.9–10.3)
Chloride: 93 mmol/L — ABNORMAL LOW (ref 98–111)
Creatinine, Ser: 8.66 mg/dL — ABNORMAL HIGH (ref 0.61–1.24)
GFR calc Af Amer: 7 mL/min — ABNORMAL LOW (ref >60)
GFR calc non Af Amer: 6 mL/min — ABNORMAL LOW (ref >60)
Glucose, Bld: 63 mg/dL — ABNORMAL LOW (ref 70–99)
Potassium: 4.6 mmol/L (ref 3.5–5.1)
Sodium: 130 mmol/L — ABNORMAL LOW (ref 135–145)

## 2018-09-13 LAB — HIV ANTIBODY (ROUTINE TESTING W REFLEX): HIV Screen 4th Generation wRfx: NONREACTIVE

## 2018-09-13 MED ORDER — OSELTAMIVIR PHOSPHATE 30 MG PO CAPS: 30.0000 mg | ORAL_CAPSULE | ORAL | 0 refills | Status: DC

## 2018-09-13 MED ORDER — OXYCODONE HCL 5 MG PO TABS
5.0000 mg | ORAL_TABLET | Freq: Once | ORAL | Status: AC
  Administered 2018-09-13: 5 mg via ORAL
  Filled 2018-09-13: qty 1

## 2018-09-13 NOTE — Progress Notes (Addendum)
[facility] Progress Note   Dialysis Orders: [facility] MWF 4 hr EDW 70.5 400/600 NO HEPARIN 2K 2 Ca ISOLATION due to Hep B status, right upper AVGG Parsabiv  7.5 venofer 100 through 3/13/ Mircera 60 q 2 weeks - last dose Mircera 100 2/18 calctiriol 2.25  Recent labs: hgb 11.1 iPTH 55 down from 100s last month  Assessment/Plan: 1. Sepsis secondary to influenza A-cultures drawn - empiric antibiotics/tamiflu per primary 2. ESRD -  MWF -K 4.6 Na 130  - needs dialysis today.  Offered to him if he changes his mind. Told him it would not be safe for him to be discharged  If he leaves AMA or further . 3. Hypertension/volume  - needs volume down some - problems with BP drops on HD Monday likely related to infection. 4. Anemia  - hgb 10.9  3/3 consistent with outpatient hgb - continue esa - hold on Fe repletion for now- keep aranesp at 100 for now  5. Metabolic bone disease -  Continue calcitriol - lower dose and hold parsabiv due to recent low iPTH of 55 - no binders - outpt P has been low 6. Nutrition - low in part due to liver disease- diet + supplements/vit 7. Hep B/C + needs isolation dialysis-has significant ascites - no recent paracentesis. 8. Afib - EKG - on admission showed NSR but had rate controlled afib on tele 9. Thrombocytopenia - plts stable  10.  Hx GIB - would use heparin judiciously - no heparin HD  11. Polysubstance abuse  12. Code - status - will change outpatient code status to DNR per patient's request. [place] - suspect he will leave AMA though I have him advised him against this.  Myriam Jacobson, PA-C [place] [phone] 09/13/2018,8:31 AM  LOS: 1 day   Pt seen, examined and agree w A/P as above.  [facility] 09/13/2018, 12:11 PM    Subjective:   C/o no food. Threatening to order a pizza.  C/o headache. Wants to leave. Refuses dialysis here. Does not want to be resuscitated if his heart stops " only wants to  die once." Per nursing - primary has seen today.  Objective Vitals:   09/12/18 2011 09/12/18 2350 09/13/18 0055 09/13/18 0450  BP:   119/60 134/81  Pulse: 74 91  79  Resp: (!) 21 (!) 21 19 17   Temp:      TempSrc:      SpO2: 99% 97% 98% 92%  Weight:      Height:       Physical Exam General: irritable demanding mild SOB on O2 Heart: RRR Lungs: clearer this am Abdomen: distended + ascites Extremities: no LE edema Dialysis Access: right upper AVGG + bruit   Additional Objective Labs: Basic Metabolic Panel: Recent Labs  Lab 09/12/18 0851 09/13/18 0429  NA 131* 130*  K 3.9 4.6  CL 89* 93*  CO2 26 20*  GLUCOSE 81 63*  BUN 19 41*  CREATININE 6.75* 8.66*  CALCIUM 8.5* 8.0*   Liver Function Tests: Recent Labs  Lab 09/12/18 0851  AST 39  ALT 19  ALKPHOS 125  BILITOT 1.8*  PROT 8.7*  ALBUMIN 4.6   No results for input(s): LIPASE, AMYLASE in the last 168 hours. CBC: Recent Labs  Lab 09/12/18 0851  WBC 7.5  HGB 10.9*  HCT 32.4*  MCV 78.8*  PLT 85*   Blood Culture    Component Value Date/Time   SDES FLUID PERITONEAL  04/04/2018 1549   SDES FLUID PERITONEAL 04/04/2018 1549   SPECREQUEST BOTTLES DRAWN AEROBIC AND ANAEROBIC 04/04/2018 1549   SPECREQUEST NONE 04/04/2018 1549   CULT  04/04/2018 1549    NO GROWTH 5 DAYS Performed at [facility], [place] [address] [place], [place] [address]    REPTSTATUS 04/09/2018 FINAL 04/04/2018 1549   REPTSTATUS 04/04/2018 FINAL 04/04/2018 1549    Cardiac Enzymes: No results for input(s): CKTOTAL, CKMB, CKMBINDEX, TROPONINI in the last 168 hours. CBG: No results for input(s): GLUCAP in the last 168 hours. Iron Studies: No results for input(s): IRON, TIBC, TRANSFERRIN, FERRITIN in the last 72 hours. Lab Results  Component Value Date   INR 1.26 03/11/2018   INR 1.26 01/21/2018   INR 1.21 12/07/2017   Studies/Results: Dg Chest Portable 1 View  Result Date: 09/12/2018 CLINICAL DATA:  Shortness of breath. EXAM:  PORTABLE CHEST 1 VIEW COMPARISON:  03/10/2018 FINDINGS: There is chronic cardiomegaly. Aortic atherosclerosis. Pulmonary vascularity is at the upper limits of normal. Chronic slight elevation of the left hemidiaphragm. No infiltrates or effusions. No acute bone abnormality. Extensive calcification in the carotid arteries. IMPRESSION: No acute abnormalities. Chronic cardiomegaly with pulmonary vascularity at the upper limits of normal. Aortic Atherosclerosis (ICD10-I70.0). Extensive calcification in the carotid arteries. Electronically Signed   By: Lorriane Shire M.D.   On: 09/12/2018 09:29   Medications: . sodium chloride    . ceFEPime (MAXIPIME) IV    . metronidazole 500 mg (09/13/18 0100)  . vancomycin     . aspirin EC  81 mg Oral Daily  . calcitRIOL  1.5 mcg Oral Q M,W,F-HD  . Chlorhexidine Gluconate Cloth  6 each Topical Q0600  . darbepoetin (ARANESP) injection - DIALYSIS  100 mcg Intravenous Q Wed-HD  . gabapentin  100 mg Oral BID  . heparin  5,000 Units Subcutaneous BID  . lactulose  30 g Oral BID  . metoprolol tartrate  25 mg Oral BID  . oseltamivir  30 mg Oral Q M,W,F-1800  . pantoprazole  40 mg Oral Q1200  . sevelamer carbonate  3,200 mg Oral TID WC   And  . sevelamer carbonate  2,400 mg Oral With snacks  . sodium chloride flush  3 mL Intravenous Q12H

## 2018-09-13 NOTE — Discharge Summary (Signed)
                                                         AMA  Patient at this time expresses desire to leave the Hospital immidiately, patient has been warned that this is not Medically advisable at this time, and can result in Medical complications like Death and Disability, patient understands and accepts the risks involved and assumes full responsibilty of this decision.   Lala Lund M.D on 09/13/2018 at 9:29 AM  [facility]  Time < 30 minutes  30 patient who is due for dialysis today was admitted for influenza a infection, currently nontoxic, refusing dialysis today and wants to be discharged home immediately.  Nephrology PA also talked to him but he is convinced that he does not require dialysis and he feels good.  He has already ordered pizza for himself which he plans to eat while on his way home.  He was counseled by me extensively but he chooses to leave AMA.  I will still give him 4 doses of oral Tamiflu to complete his influenza course.  He was thoroughly counseled that this action can lead to disability, death, sepsis, stroke etc.

## 2018-09-13 NOTE — Progress Notes (Addendum)
Removed PIV and gave patient written tamiflu prescription. Counseled patient to wear mask while in public as he is flu positive. Gave patient three extra masks for ride home. Per patient, he is calling a cab.  [name] Comber, RN 09/13/2018 9:53 AM

## 2018-09-13 NOTE — Progress Notes (Signed)
Date: 09/13/2018 Patient: [name] Admitted: 09/12/2018  8:40 AM Attending Provider: [name], MD  [name] or his authorized caregiver has made the decision for the patient to leave the hospital against the advice of [name], MD.  He or his authorized caregiver has been informed and understands the inherent risks, including death.  He or his authorized caregiver has decided to accept the responsibility for this decision. [name] and all necessary parties have been advised that he may return for further evaluation or treatment. His condition at time of discharge was Fair.  [name] had current vital signs as follows:  Blood pressure 134/81, pulse 79, temperature 97.9 F (36.6 C), temperature source Axillary, resp. rate 17, height 5\' 9"  (1.753 m), weight 72.6 kg, SpO2 92 %.   [name] or his authorized caregiver has not signed the Leaving Against Medical Advice form prior to leaving the department.  Joshua Comber, RN 09/13/2018 10:00 AM

## 2018-09-13 NOTE — Progress Notes (Signed)
Upon entering patient's room this morning, patient verbally aggressive and unhappy with the care he has been receiving. Patient stated he has a headache and when this RN offered to page doctor for pain medications, he said that this was a "poorly ran facility" and he wishes to leave. Discussed with Dr. Candiss Norse that if patient wants to leave and is refusing treatments, then he can leave against medical advice. Charge nurse, Niger Jackson, spoke with patient as well and patient still requests to leave AMA. Patient currently calling [employer] for the ride home.   [name] Comber, RN 09/13/2018 9:51 AM

## 2018-09-15 DIAGNOSIS — N186 End stage renal disease: Secondary | ICD-10-CM | POA: Diagnosis not present

## 2018-09-15 DIAGNOSIS — D509 Iron deficiency anemia, unspecified: Secondary | ICD-10-CM | POA: Diagnosis not present

## 2018-09-15 DIAGNOSIS — D631 Anemia in chronic kidney disease: Secondary | ICD-10-CM | POA: Diagnosis not present

## 2018-09-15 DIAGNOSIS — N2581 Secondary hyperparathyroidism of renal origin: Secondary | ICD-10-CM | POA: Diagnosis not present

## 2018-09-17 LAB — CULTURE, BLOOD (ROUTINE X 2)
Culture: NO GROWTH
Culture: NO GROWTH
Special Requests: ADEQUATE
Special Requests: ADEQUATE

## 2018-09-18 DIAGNOSIS — N186 End stage renal disease: Secondary | ICD-10-CM | POA: Diagnosis not present

## 2018-09-18 DIAGNOSIS — N2581 Secondary hyperparathyroidism of renal origin: Secondary | ICD-10-CM | POA: Diagnosis not present

## 2018-09-18 DIAGNOSIS — D509 Iron deficiency anemia, unspecified: Secondary | ICD-10-CM | POA: Diagnosis not present

## 2018-09-18 DIAGNOSIS — D631 Anemia in chronic kidney disease: Secondary | ICD-10-CM | POA: Diagnosis not present

## 2018-09-20 ENCOUNTER — Emergency Department (HOSPITAL_COMMUNITY)
Admission: EM | Admit: 2018-09-20 | Discharge: 2018-09-21 | Disposition: A | Discharge status: Home / Self Care | Payer: Medicare Other | Attending: Emergency Medicine | Admitting: Emergency Medicine

## 2018-09-20 ENCOUNTER — Emergency Department (HOSPITAL_COMMUNITY): Payer: Medicare Other

## 2018-09-20 ENCOUNTER — Encounter (HOSPITAL_COMMUNITY): Payer: Self-pay | Admitting: Emergency Medicine

## 2018-09-20 DIAGNOSIS — I4891 Unspecified atrial fibrillation: Secondary | ICD-10-CM | POA: Diagnosis not present

## 2018-09-20 DIAGNOSIS — I2510 Atherosclerotic heart disease of native coronary artery without angina pectoris: Secondary | ICD-10-CM | POA: Diagnosis not present

## 2018-09-20 DIAGNOSIS — J449 Chronic obstructive pulmonary disease, unspecified: Secondary | ICD-10-CM | POA: Insufficient documentation

## 2018-09-20 DIAGNOSIS — B349 Viral infection, unspecified: Secondary | ICD-10-CM

## 2018-09-20 DIAGNOSIS — I252 Old myocardial infarction: Secondary | ICD-10-CM | POA: Diagnosis not present

## 2018-09-20 DIAGNOSIS — J811 Chronic pulmonary edema: Secondary | ICD-10-CM | POA: Diagnosis not present

## 2018-09-20 DIAGNOSIS — Z9119 Patient's noncompliance with other medical treatment and regimen: Secondary | ICD-10-CM | POA: Diagnosis not present

## 2018-09-20 DIAGNOSIS — W19XXXA Unspecified fall, initial encounter: Secondary | ICD-10-CM | POA: Diagnosis not present

## 2018-09-20 DIAGNOSIS — Z992 Dependence on renal dialysis: Secondary | ICD-10-CM | POA: Diagnosis not present

## 2018-09-20 DIAGNOSIS — Z79899 Other long term (current) drug therapy: Secondary | ICD-10-CM | POA: Insufficient documentation

## 2018-09-20 DIAGNOSIS — I120 Hypertensive chronic kidney disease with stage 5 chronic kidney disease or end stage renal disease: Secondary | ICD-10-CM | POA: Insufficient documentation

## 2018-09-20 DIAGNOSIS — S299XXA Unspecified injury of thorax, initial encounter: Secondary | ICD-10-CM | POA: Diagnosis not present

## 2018-09-20 DIAGNOSIS — R062 Wheezing: Secondary | ICD-10-CM | POA: Diagnosis not present

## 2018-09-20 DIAGNOSIS — F17210 Nicotine dependence, cigarettes, uncomplicated: Secondary | ICD-10-CM | POA: Diagnosis not present

## 2018-09-20 DIAGNOSIS — Z7982 Long term (current) use of aspirin: Secondary | ICD-10-CM | POA: Diagnosis not present

## 2018-09-20 DIAGNOSIS — R0781 Pleurodynia: Secondary | ICD-10-CM | POA: Diagnosis not present

## 2018-09-20 DIAGNOSIS — Z9115 Patient's noncompliance with renal dialysis: Secondary | ICD-10-CM | POA: Diagnosis not present

## 2018-09-20 DIAGNOSIS — N186 End stage renal disease: Secondary | ICD-10-CM | POA: Insufficient documentation

## 2018-09-20 DIAGNOSIS — R531 Weakness: Secondary | ICD-10-CM | POA: Diagnosis present

## 2018-09-20 LAB — CBC WITH DIFFERENTIAL/PLATELET
Abs Immature Granulocytes: 0.03 10*3/uL (ref 0.00–0.07)
Basophils Absolute: 0 10*3/uL (ref 0.0–0.1)
Basophils Relative: 0 %
Eosinophils Absolute: 0.2 10*3/uL (ref 0.0–0.5)
Eosinophils Relative: 2 %
HCT: 28.2 % — ABNORMAL LOW (ref 39.0–52.0)
Hemoglobin: 10 g/dL — ABNORMAL LOW (ref 13.0–17.0)
Immature Granulocytes: 0 %
Lymphocytes Relative: 22 %
Lymphs Abs: 1.7 10*3/uL (ref 0.7–4.0)
MCH: 27.2 pg (ref 26.0–34.0)
MCHC: 35.5 g/dL (ref 30.0–36.0)
MCV: 76.8 fL — ABNORMAL LOW (ref 80.0–100.0)
Monocytes Absolute: 0.7 10*3/uL (ref 0.1–1.0)
Monocytes Relative: 9 %
Neutro Abs: 5.2 10*3/uL (ref 1.7–7.7)
Neutrophils Relative %: 67 %
Platelets: 114 10*3/uL — ABNORMAL LOW (ref 150–400)
RBC: 3.67 MIL/uL — ABNORMAL LOW (ref 4.22–5.81)
RDW: 17 % — ABNORMAL HIGH (ref 11.5–15.5)
WBC: 7.9 10*3/uL (ref 4.0–10.5)
nRBC: 0 % (ref 0.0–0.2)

## 2018-09-20 LAB — COMPREHENSIVE METABOLIC PANEL
ALT: 33 U/L (ref 0–44)
AST: 64 U/L — ABNORMAL HIGH (ref 15–41)
Albumin: 4.1 g/dL (ref 3.5–5.0)
Alkaline Phosphatase: 136 U/L — ABNORMAL HIGH (ref 38–126)
Anion gap: 16 — ABNORMAL HIGH (ref 5–15)
BUN: 40 mg/dL — ABNORMAL HIGH (ref 6–20)
CO2: 20 mmol/L — ABNORMAL LOW (ref 22–32)
Calcium: 8.2 mg/dL — ABNORMAL LOW (ref 8.9–10.3)
Chloride: 93 mmol/L — ABNORMAL LOW (ref 98–111)
Creatinine, Ser: 10.32 mg/dL — ABNORMAL HIGH (ref 0.61–1.24)
GFR calc Af Amer: 6 mL/min — ABNORMAL LOW (ref >60)
GFR calc non Af Amer: 5 mL/min — ABNORMAL LOW (ref >60)
Glucose, Bld: 125 mg/dL — ABNORMAL HIGH (ref 70–99)
Potassium: 5.3 mmol/L — ABNORMAL HIGH (ref 3.5–5.1)
Sodium: 129 mmol/L — ABNORMAL LOW (ref 135–145)
Total Bilirubin: 1 mg/dL (ref 0.3–1.2)
Total Protein: 7.9 g/dL (ref 6.5–8.1)

## 2018-09-20 LAB — LACTIC ACID, PLASMA: Lactic Acid, Venous: 1.4 mmol/L (ref 0.5–1.9)

## 2018-09-20 MED ORDER — SODIUM CHLORIDE 0.9% FLUSH: 3.0000 mL | Freq: Once | INTRAVENOUS | Status: DC

## 2018-09-20 NOTE — ED Triage Notes (Signed)
BIB EMS from home. Pt recently seen here 09/12/2018 and Dx with Influenza. Pt was admitted and left [facility] 09/13/2018. Pt presents today "just not feeling any better" Mild wheezing with EMS, given 5 Albuterol, 0.5 Atrovent. VSS. CBG 106. MWF Dialysis, states last treatment was Monday.

## 2018-09-21 ENCOUNTER — Emergency Department (HOSPITAL_COMMUNITY): Payer: Medicare Other

## 2018-09-21 DIAGNOSIS — B349 Viral infection, unspecified: Secondary | ICD-10-CM | POA: Diagnosis not present

## 2018-09-21 DIAGNOSIS — R0781 Pleurodynia: Secondary | ICD-10-CM | POA: Diagnosis not present

## 2018-09-21 DIAGNOSIS — S299XXA Unspecified injury of thorax, initial encounter: Secondary | ICD-10-CM | POA: Diagnosis not present

## 2018-09-21 MED ORDER — SODIUM ZIRCONIUM CYCLOSILICATE 5 G PO PACK
5.0000 g | PACK | Freq: Once | ORAL | Status: AC
  Administered 2018-09-21: 5 g via ORAL
  Filled 2018-09-21: qty 1

## 2018-09-21 MED ORDER — IPRATROPIUM-ALBUTEROL 0.5-2.5 (3) MG/3ML IN SOLN
3.0000 mL | Freq: Once | RESPIRATORY_TRACT | Status: AC
  Administered 2018-09-21: 3 mL via RESPIRATORY_TRACT
  Filled 2018-09-21: qty 3

## 2018-09-21 NOTE — Discharge Instructions (Signed)
Please get dialyze tomorrow.  Follow up with your doctor for further care.

## 2018-09-21 NOTE — ED Notes (Signed)
Pt verbalized understanding of discharge paperwork and follow-up care.  °

## 2018-09-21 NOTE — ED Notes (Signed)
Pt refused rectal temp.

## 2018-09-21 NOTE — ED Provider Notes (Signed)
[facility] EMERGENCY DEPARTMENT Provider Note   CSN: [id number] Arrival date & time: 09/20/18  2202    History   Chief Complaint Chief Complaint  Patient presents with   Influenza    HPI [name] is a 58 y.o. male.     The history is provided by the patient and medical records. No language interpreter was used.  Influenza     58 year old male who is a dialysis patient, history of hepatitis B COPD, hypertension, CAD brought here by EMS from home with flu symptoms.  Patient was diagnosed with influenza A on 09/12/2018.  At that time, plan was to have him admitted but patient left AMA.  Patient states that after waiting for a prolonged period, he decides to leave.  Since then his symptom has not improved.  He mentioned having his weakness, increased shortness of breath, nonproductive cough, "I just do not feel well".  Patient also mention having a syncopal episode 5 days ago while he was washing his vegetable by the sink.  Pt he struck the right side of his back against the ground and having pain since. Described sharp pain to R mid back, moderate in severity, worsening with movement.  He denies any fever or body aches.  He reports having forgetfulness, and forgot to go to schedule dialysis yesterday.  Patient denies nausea vomiting diarrhea  Past Medical History:  Diagnosis Date   Anemia    Anxiety    Arthritis    left shoulder   Atherosclerosis of aorta (HCC)    Cardiomegaly    Chest pain    DATE UNKNOWN, C/O PERIODICALLY   Cocaine abuse (HCC)    COPD exacerbation (HCC) 08/17/2016   Coronary artery disease    stent 02/22/17   ESRD (end stage renal disease) on dialysis ([place])    "[name]; MWF" (07/04/2017)   GERD (gastroesophageal reflux disease)    DATE UNKNOWN   Hemorrhoids    Hepatitis B, chronic (HCC)    Hepatitis C    History of kidney stones    Hyperkalemia    Hypertension    Kidney failure    Metabolic bone  disease    Patient denies   Mitral stenosis    Myocardial infarction [facility])    Pneumonia    Pulmonary edema    Solitary rectal ulcer syndrome 07/2017   at flex sig for rectal bleeding   Tubular adenoma of colon     Patient Active Problem List   Diagnosis Date Noted   Sepsis ([place]) 09/12/2018   CAD (coronary artery disease) 03/11/2018   Benign neoplasm of cecum    Benign neoplasm of ascending colon    Benign neoplasm of descending colon    Benign neoplasm of rectum    Atrial fibrillation ([place]) 01/23/2018   Hx of colonic polyps 01/20/2018   ESRD (end stage renal disease) ([place]) 11/21/2017   GERD (gastroesophageal reflux disease) 11/16/2017   Liver cirrhosis ([place]) 11/15/2017   DNR (do not resuscitate)    Palliative care by specialist    Hyponatremia 11/04/2017   SBP (spontaneous bacterial peritonitis) ([place]) 10/30/2017   Liver disease, chronic 10/30/2017   SOB (shortness of breath)    Abdominal pain 10/28/2017   Upper airway cough syndrome with flattening on f/v loop 10/13/17 c/w vcd 10/17/2017   Elevated diaphragm 10/13/2017   Ileus ([place]) 09/29/2017   Malnutrition of moderate degree 09/29/2017   Sinus congestion 09/03/2017   Symptomatic anemia 09/02/2017   Other cirrhosis of  liver ([place]) 09/02/2017   Left bundle branch block 09/02/2017   Mitral stenosis 09/02/2017   Hematochezia 07/15/2017   Wide-complex tachycardia ([place])    Endotracheally intubated    ESRD on dialysis ([place]) 07/04/2017   Acute respiratory failure with hypoxia ([place]) 06/18/2017   CKD (chronic kidney disease) stage V requiring chronic dialysis ([place]) 06/18/2017   History of Cocaine abuse ([place]) 06/18/2017   Hypertension 06/18/2017   Infection of AV graft for dialysis ([place]) 06/18/2017   Anxiety 06/18/2017   Anemia due to chronic kidney disease 06/18/2017   Atrial flutter with rapid ventricular response ([place]) 06/18/2017   Personality disorder ([place]) 06/13/2017    Cellulitis 06/12/2017   Adjustment disorder with mixed anxiety and depressed mood 06/10/2017   Suicidal ideation 06/10/2017   Arm wound, left, sequela 06/10/2017   Dyspnea on exertion 05/29/2017   Tachycardia 05/29/2017   Hyperkalemia 00/86/7619   Acute metabolic encephalopathy    Anemia 04/23/2017   Ascites 04/23/2017   COPD (chronic obstructive pulmonary disease) ([place]) 04/23/2017   Acute on chronic respiratory failure with hypoxia ([place]) 03/25/2017   Arrhythmia 03/25/2017   COPD GOLD 0 with flattening on inps f/v  09/27/2016   Essential hypertension 09/27/2016   Fluid overload 08/30/2016   COPD exacerbation ([place]) 08/17/2016   Hypertensive urgency 08/17/2016   Respiratory failure ([place]) 08/17/2016   Problem with dialysis access ([place]) 07/23/2016   Chronic hepatitis B ([place]) 03/05/2014   Chronic hepatitis C without hepatic coma ([place]) 03/05/2014   Internal hemorrhoids with bleeding, swelling and itching 03/05/2014   Thrombocytopenia ([place]) 03/05/2014   Chest pain 02/27/2014   Alcohol abuse 04/14/2009   Cigarette smoker 04/14/2009   GANGLION CYST 04/14/2009    Past Surgical History:  Procedure Laterality Date   A/V FISTULAGRAM Left 05/26/2017   Procedure: A/V FISTULAGRAM;  Surgeon: Conrad Sonora, MD;  Location: [place] [facility];  Service: Cardiovascular;  Laterality: Left;   A/V FISTULAGRAM Right 11/18/2017   Procedure: A/V FISTULAGRAM - Right Arm;  Surgeon: Elam Dutch, MD;  Location: [place] [facility];  Service: Cardiovascular;  Laterality: Right;   APPLICATION OF WOUND VAC Left 06/14/2017   Procedure: APPLICATION OF WOUND VAC;  Surgeon: Katha Cabal, MD;  Location: [facility];  Service: Vascular;  Laterality: Left;   AV FISTULA PLACEMENT  2012   BELIEVED WAS PLACED IN JUNE   AV FISTULA PLACEMENT Right 08/09/2017   Procedure: Creation Right arm ARTERIOVENOUS BRACHIOCEPOHALIC FISTULA;  Surgeon: Elam Dutch, MD;  Location: [facility] OR;   Service: Vascular;  Laterality: Right;   AV FISTULA PLACEMENT Right 11/22/2017   Procedure: INSERTION OF ARTERIOVENOUS (AV) GORE-TEX GRAFT RIGHT UPPER ARM;  Surgeon: Elam Dutch, MD;  Location: [place];  Service: Vascular;  Laterality: Right;   BIOPSY  01/25/2018   Procedure: BIOPSY;  Surgeon: Jerene Bears, MD;  Location: [place];  Service: Gastroenterology;;   COLONOSCOPY     COLONOSCOPY WITH PROPOFOL N/A 01/25/2018   Procedure: COLONOSCOPY WITH PROPOFOL;  Surgeon: Jerene Bears, MD;  Location: [place];  Service: Gastroenterology;  Laterality: N/A;   CORONARY STENT INTERVENTION N/A 02/22/2017   Procedure: CORONARY STENT INTERVENTION;  Surgeon: Nigel Mormon, MD;  Location: [place] [facility];  Service: Cardiovascular;  Laterality: N/A;   ESOPHAGOGASTRODUODENOSCOPY (EGD) WITH PROPOFOL N/A 01/25/2018   Procedure: ESOPHAGOGASTRODUODENOSCOPY (EGD) WITH PROPOFOL;  Surgeon: Jerene Bears, MD;  Location: [place];  Service: Gastroenterology;  Laterality: N/A;   FLEXIBLE SIGMOIDOSCOPY N/A 07/15/2017   Procedure:  FLEXIBLE SIGMOIDOSCOPY;  Surgeon: Carol Ada, MD;  Location: [place];  Service: Endoscopy;  Laterality: N/A;   HEMORRHOID BANDING     I&D EXTREMITY Left 06/01/2017   Procedure: IRRIGATION AND DEBRIDEMENT LEFT ARM HEMATOMA WITH LIGATION OF LEFT ARM AV FISTULA;  Surgeon: Elam Dutch, MD;  Location: [place];  Service: Vascular;  Laterality: Left;   I&D EXTREMITY Left 06/14/2017   Procedure: IRRIGATION AND DEBRIDEMENT EXTREMITY;  Surgeon: Katha Cabal, MD;  Location: [facility];  Service: Vascular;  Laterality: Left;   INSERTION OF DIALYSIS CATHETER  05/30/2017   INSERTION OF DIALYSIS CATHETER N/A 05/30/2017   Procedure: INSERTION OF DIALYSIS CATHETER;  Surgeon: Elam Dutch, MD;  Location: [place];  Service: Vascular;  Laterality: N/A;   IR PARACENTESIS  08/30/2017   IR PARACENTESIS  09/29/2017   IR PARACENTESIS  10/28/2017   IR PARACENTESIS   11/09/2017   IR PARACENTESIS  11/16/2017   IR PARACENTESIS  11/28/2017   IR PARACENTESIS  12/01/2017   IR PARACENTESIS  12/06/2017   IR PARACENTESIS  01/03/2018   IR PARACENTESIS  01/23/2018   IR PARACENTESIS  02/07/2018   IR PARACENTESIS  02/21/2018   IR PARACENTESIS  03/06/2018   IR PARACENTESIS  03/17/2018   IR PARACENTESIS  04/04/2018   IR RADIOLOGIST EVAL & MGMT  02/14/2018   LEFT HEART CATH AND CORONARY ANGIOGRAPHY N/A 02/22/2017   Procedure: LEFT HEART CATH AND CORONARY ANGIOGRAPHY;  Surgeon: Nigel Mormon, MD;  Location: [place] [facility];  Service: Cardiovascular;  Laterality: N/A;   LIGATION OF ARTERIOVENOUS  FISTULA Left 08/18/3783   Procedure: Plication of Left Arm Arteriovenous Fistula;  Surgeon: Elam Dutch, MD;  Location: [facility] OR;  Service: Vascular;  Laterality: Left;   POLYPECTOMY     POLYPECTOMY  01/25/2018   Procedure: POLYPECTOMY;  Surgeon: Jerene Bears, MD;  Location: [place];  Service: Gastroenterology;;   REVISON OF ARTERIOVENOUS FISTULA Left 8/85/0277   Procedure: PLICATION OF DISTAL ANEURYSMAL SEGEMENT OF LEFT UPPER ARM ARTERIOVENOUS FISTULA;  Surgeon: Elam Dutch, MD;  Location: [place];  Service: Vascular;  Laterality: Left;   REVISON OF ARTERIOVENOUS FISTULA Left 10/21/8784   Procedure: Plication of Left Upper Arm Fistula ;  Surgeon: Waynetta Sandy, MD;  Location: [place];  Service: Vascular;  Laterality: Left;   SKIN GRAFT SPLIT THICKNESS LEG / FOOT Left    SKIN GRAFT SPLIT THICKNESS LEFT ARM DONOR SITE: LEFT ANTERIOR THIGH   SKIN SPLIT GRAFT Left 07/04/2017   Procedure: SKIN GRAFT SPLIT THICKNESS LEFT ARM DONOR SITE: LEFT ANTERIOR THIGH;  Surgeon: Elam Dutch, MD;  Location: [place];  Service: Vascular;  Laterality: Left;   THROMBECTOMY W/ EMBOLECTOMY Left 06/05/2017   Procedure: EXPLORATION OF LEFT ARM FOR BLEEDING; OVERSEWED PROXIMAL FISTULA;  Surgeon: Angelia Mould, MD;  Location: [place];  Service: Vascular;   Laterality: Left;   WOUND EXPLORATION Left 06/03/2017   Procedure: WOUND EXPLORATION WITH WOUND VAC APPLICATION TO LEFT ARM;  Surgeon: Angelia Mould, MD;  Location: [facility] OR;  Service: Vascular;  Laterality: Left;        Home Medications    Prior to Admission medications   Medication Sig Start Date End Date Taking? Authorizing Provider  albuterol (VENTOLIN HFA) 108 (90 Base) MCG/ACT inhaler Inhale 2 puffs into the lungs every 6 (six) hours as needed for wheezing or shortness of breath.    [provider]  aspirin 81 MG EC tablet Take 1 tablet (81  mg total) by mouth daily. 03/12/18   Geradine Girt, DO  bisacodyl (DULCOLAX) 5 MG EC tablet Take 5 mg by mouth daily as needed for moderate constipation.    [provider]  gabapentin (NEURONTIN) 100 MG capsule Take 100 mg by mouth 2 (two) times daily. 12/13/17   [provider]  hydrALAZINE (APRESOLINE) 100 MG tablet Take 1 tablet (100 mg total) by mouth 2 (two) times daily as needed. Taking after HD on Dialysis days Patient taking differently: Take 100 mg by mouth 2 (two) times daily as needed (high blood pressure). If needed on Monday, Wednesday, Friday take after dialysis 03/11/18   Geradine Girt, DO  lactulose (CHRONULAC) 10 GM/15ML solution Take 45 mLs (30 g total) by mouth 2 (two) times daily. 03/11/18   Geradine Girt, DO  metoprolol tartrate (LOPRESSOR) 25 MG tablet Take 1 tablet (25 mg total) by mouth 2 (two) times daily. 03/11/18   Geradine Girt, DO  mirtazapine (REMERON) 15 MG tablet Take 15 mg by mouth at bedtime.    [provider]  mirtazapine (REMERON) 30 MG tablet Take 30 mg by mouth at bedtime.    [provider]  oseltamivir (TAMIFLU) 30 MG capsule Take 1 capsule (30 mg total) by mouth every Monday, Wednesday, and Friday at 6 PM. 09/13/18   [name], MD  oxyCODONE (OXY IR/ROXICODONE) 5 MG immediate release tablet Take 5 mg by mouth every 4 (four) hours as needed for severe  pain.    [provider]  pantoprazole (PROTONIX) 40 MG tablet Take 1 tablet (40 mg total) by mouth daily at 12 noon. Patient taking differently: Take 40 mg by mouth See admin instructions. Take one tablet (40 mg) by mouth in the morning on Sunday, Tuesday, Thursday, Saturday, take one tablet (40 mg) after dialysis on Monday, Wednesday, Friday. May also take one tablet (40 mg) in the evening as needed for acid reflux. 03/12/18   Geradine Girt, DO  sevelamer carbonate (RENVELA) 800 MG tablet Take 4 tablets with meals  And 2 tablets twice daily with snacks Patient taking differently: Take 2,400-3,200 mg by mouth See admin instructions. Take 3 - 4 tablets (2400 - 3200 mg) up to five times daily with meals or snacks 01/22/18   Patrecia Pour, MD  traZODone (DESYREL) 100 MG tablet Take 100 mg by mouth at bedtime.    [provider]  traZODone (DESYREL) 50 MG tablet Take 50 mg by mouth at bedtime as needed for sleep.     [provider]    Family History Family History  Problem Relation Age of Onset   Heart disease Mother    Lung cancer Mother    Heart disease Father    Malignant hyperthermia Father    COPD Father    Throat cancer Sister    Esophageal cancer Sister    Hypertension Other    COPD Other    Colon cancer Neg Hx    Colon polyps Neg Hx    Rectal cancer Neg Hx    Stomach cancer Neg Hx     Social History Social History   Tobacco Use   Smoking status: Current Every Day Smoker    Packs/day: 0.50    Years: 43.00    Pack years: 21.50    Types: Cigarettes    Start date: 08/13/1973   Smokeless tobacco: Never Used  Substance Use Topics   Alcohol use: Not Currently    Frequency: Never  Comment: quit drinking in 2017   Drug use: Yes    Types: Marijuana    Comment: quit in 2017"     Allergies   Morphine and related; Aspirin; Clonidine derivatives; Tramadol; and Tylenol [acetaminophen]   Review of Systems Review of Systems  All  other systems reviewed and are negative.    Physical Exam Updated Vital Signs BP (!) 165/71    Pulse 83    Temp (!) 97.4 F (36.3 C) (Oral)    Resp 15    SpO2 98%   Physical Exam Vitals signs and nursing note reviewed.  Constitutional:      General: He is not in acute distress.    Appearance: He is well-developed.  HENT:     Head: Atraumatic.     Right Ear: Tympanic membrane normal.     Left Ear: Tympanic membrane normal.     Nose: Nose normal.     Mouth/Throat:     Mouth: Mucous membranes are moist.  Eyes:     Conjunctiva/sclera: Conjunctivae normal.  Neck:     Musculoskeletal: Neck supple. No neck rigidity.  Cardiovascular:     Rate and Rhythm: Rhythm irregular.     Pulses: Normal pulses.     Heart sounds: Normal heart sounds.  Pulmonary:     Effort: No respiratory distress.     Breath sounds: Wheezing and rhonchi present.  Abdominal:     Palpations: Abdomen is soft.     Tenderness: There is no abdominal tenderness.  Musculoskeletal:        General: Tenderness (Tenderness about the right posterior rib line inferiorly without any crepitus or emphysema, no bruising noted.  No significant midline spine tenderness) present.  Skin:    Findings: No rash.  Neurological:     Mental Status: He is alert and oriented to person, place, and time.  Psychiatric:        Mood and Affect: Mood normal.      ED Treatments / Results  Labs (all labs ordered are listed, but only abnormal results are displayed) Labs Reviewed  COMPREHENSIVE METABOLIC PANEL - Abnormal; Notable for the following components:      Result Value   Sodium 129 (*)    Potassium 5.3 (*)    Chloride 93 (*)    CO2 20 (*)    Glucose, Bld 125 (*)    BUN 40 (*)    Creatinine, Ser 10.32 (*)    Calcium 8.2 (*)    AST 64 (*)    Alkaline Phosphatase 136 (*)    GFR calc non Af Amer 5 (*)    GFR calc Af Amer 6 (*)    Anion gap 16 (*)    All other components within normal limits  CBC WITH DIFFERENTIAL/PLATELET  - Abnormal; Notable for the following components:   RBC 3.67 (*)    Hemoglobin 10.0 (*)    HCT 28.2 (*)    MCV 76.8 (*)    RDW 17.0 (*)    Platelets 114 (*)    All other components within normal limits  LACTIC ACID, PLASMA    EKG None  ED ECG REPORT   Date: 09/21/2018  Rate: 69  Rhythm: atrial fibrillation  QRS Axis: left  Intervals: QT prolonged  ST/T Wave abnormalities: nonspecific ST changes  Conduction Disutrbances:left bundle branch block  Narrative Interpretation:   Old EKG Reviewed: unchanged  I have personally reviewed the EKG tracing and agree with the computerized printout as noted.   Radiology  Dg Chest 2 View  Result Date: 09/20/2018 CLINICAL DATA:  Patient was diagnosed with influenza on 09/12/2018 and not improving. Flu-like symptoms. EXAM: CHEST - 2 VIEW COMPARISON:  09/12/2018 FINDINGS: Shallow inspiration with elevation of left hemidiaphragm, chronic. Cardiac enlargement. Increasing pulmonary vascularity with suggestion of mild developing interstitial pattern to the lungs. This may indicate developing edema. No focal consolidation. No blunting of costophrenic angles. No pneumothorax. Vascular calcifications. IMPRESSION: Cardiac enlargement with developing pulmonary vascular congestion and mild interstitial edema. No focal consolidation. Electronically Signed   By: Lucienne Capers M.D.   On: 09/20/2018 23:08    Procedures Procedures (including critical care time)  Medications Ordered in ED Medications  sodium chloride flush (NS) 0.9 % injection 3 mL (has no administration in time range)     Initial Impression / Assessment and Plan / ED Course  I have reviewed the triage vital signs and the nursing notes.  Pertinent labs & imaging results that were available during my care of the patient were reviewed by me and considered in my medical decision making (see chart for details).        BP (!) 165/71    Pulse 83    Temp (!) 97.4 F (36.3 C) (Oral)     Resp 15    SpO2 98%    Final Clinical Impressions(s) / ED Diagnoses   Final diagnoses:  Viral illness  Dialysis patient, noncompliant [facility])    ED Discharge Orders    None     6:36 AM Patient was diagnosed with influenza A 8 days ago but left AMA.  He did received a full course of Tamiflu at discharge.  His symptoms never got better he also report having a syncopal episode, fell and struck the right side of his back against the ground.  Will obtain x-ray of his right ribs.  He does not have any abdominal pain to suggest significant intra-abdominal injury.  On exam, does have expiratory wheezes benefiting from breathing treatment.  He also missed his last dialysis treatment, and today blood work is remarkable for an elevated potassium of 5.3.  Chest x-ray shows developing pulmonary vascular congestion and mild interstitial edema.  He will need dialysis soon.  Will discuss this with nephrology.  8:02 AM Breathing improved with DuoNeb.  Patient is resting comfortably and eating his breakfast.  I discussed options of getting dialyzed today and admission at this time patient refused dialysis and does not want to be admitted.  He prefers to have dialysis tomorrow instead he understand that his labs are abnormal.  Does not want to be dialyzed.  Patient will be discharged here to have his dialysis tomorrow.  Return precaution discussed. Lokelma given for hyperkalemia.  Care discussed with Dr. Venora Maples.    Domenic Moras, PA-C 09/21/18 Delorise Jackson    Jola Schmidt, MD 09/21/18 [phone]

## 2018-09-22 DIAGNOSIS — D631 Anemia in chronic kidney disease: Secondary | ICD-10-CM | POA: Diagnosis not present

## 2018-09-22 DIAGNOSIS — N2581 Secondary hyperparathyroidism of renal origin: Secondary | ICD-10-CM | POA: Diagnosis not present

## 2018-09-22 DIAGNOSIS — D509 Iron deficiency anemia, unspecified: Secondary | ICD-10-CM | POA: Diagnosis not present

## 2018-09-22 DIAGNOSIS — N186 End stage renal disease: Secondary | ICD-10-CM | POA: Diagnosis not present

## 2018-09-23 IMAGING — CR DG CHEST 2V
2 series · 2 of 2 positions shown · non-contrast
Comparison: 02/21/2017

CLINICAL DATA: Dyspnea x1 day.  Current smoker.

EXAM:
CHEST  2 VIEW

[chest pa]
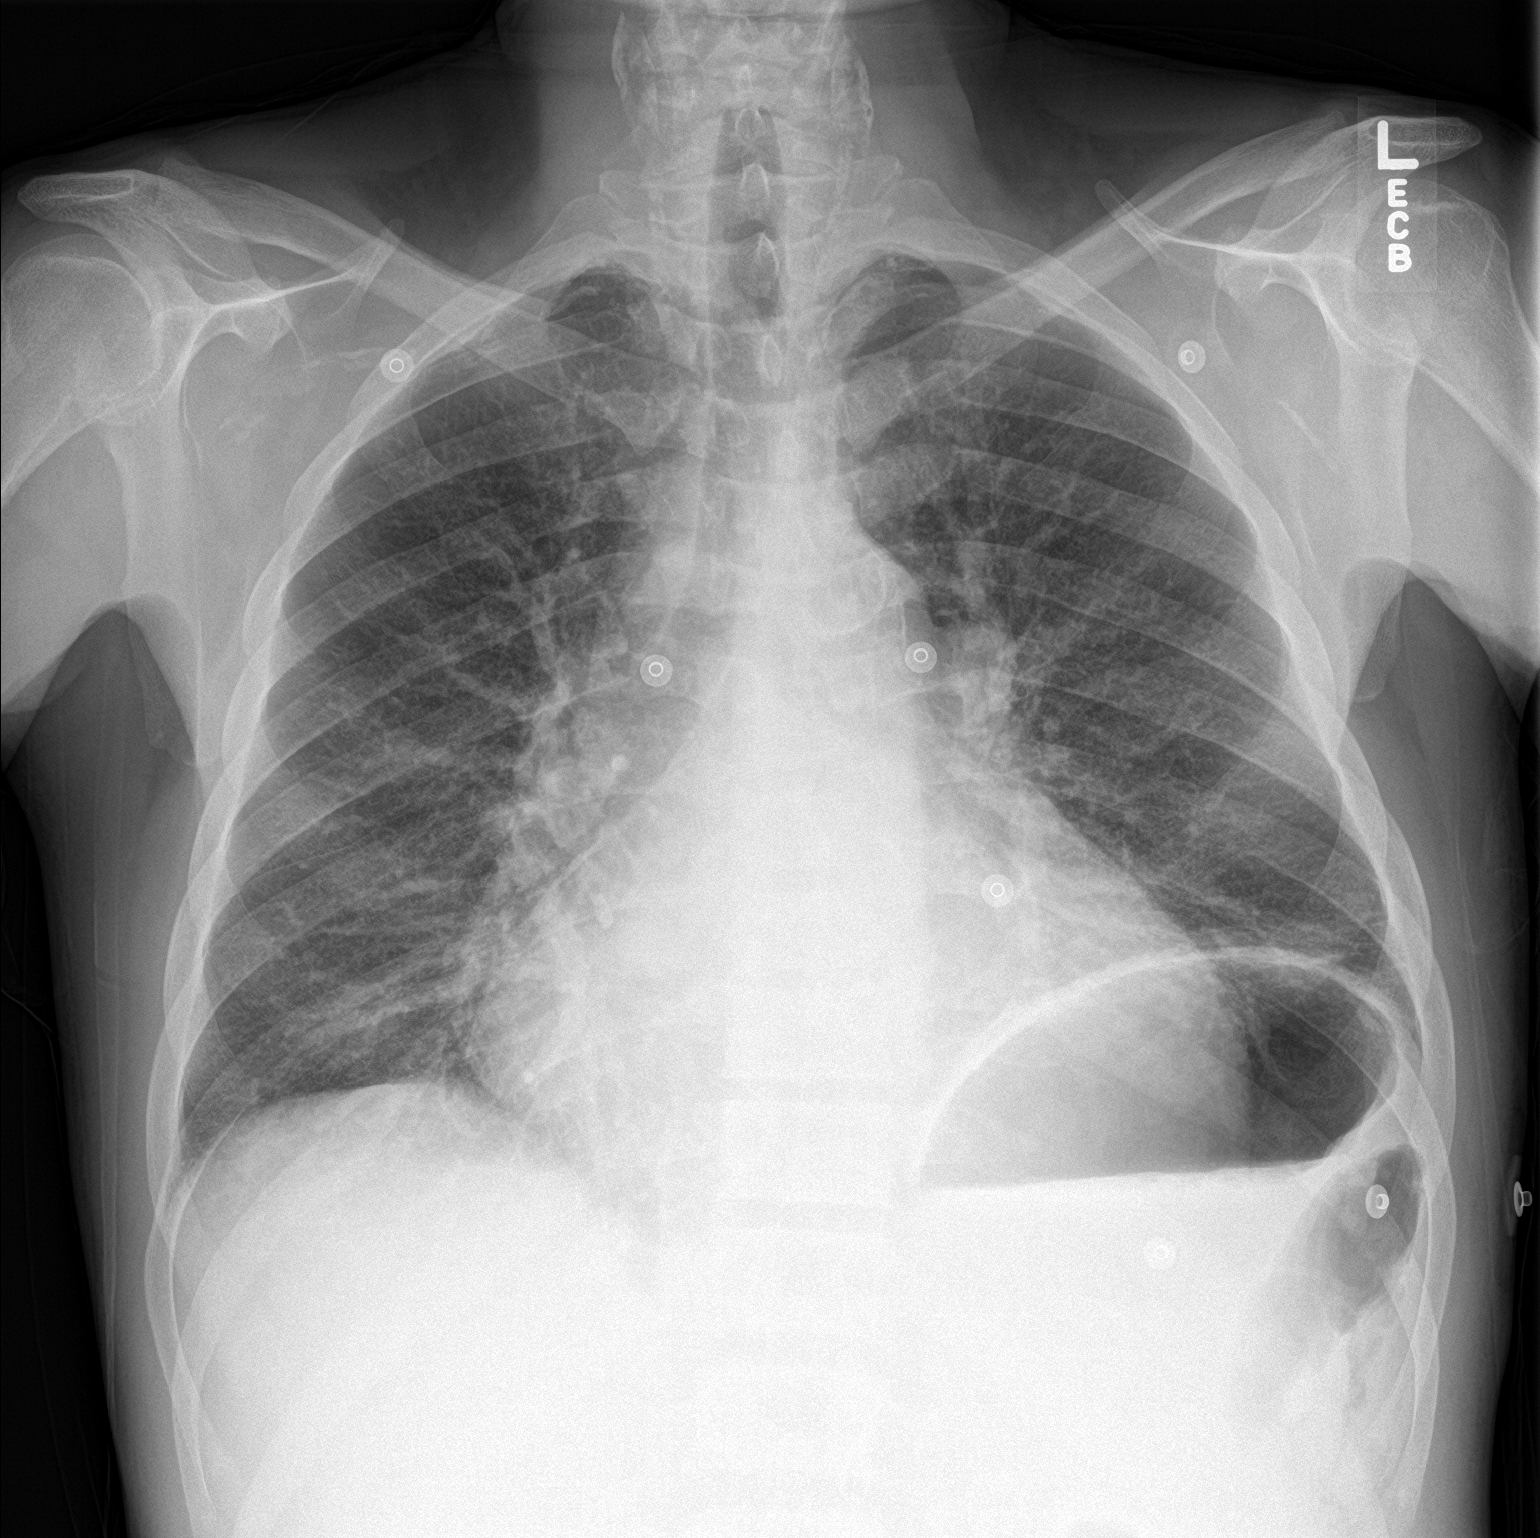

[chest lat]
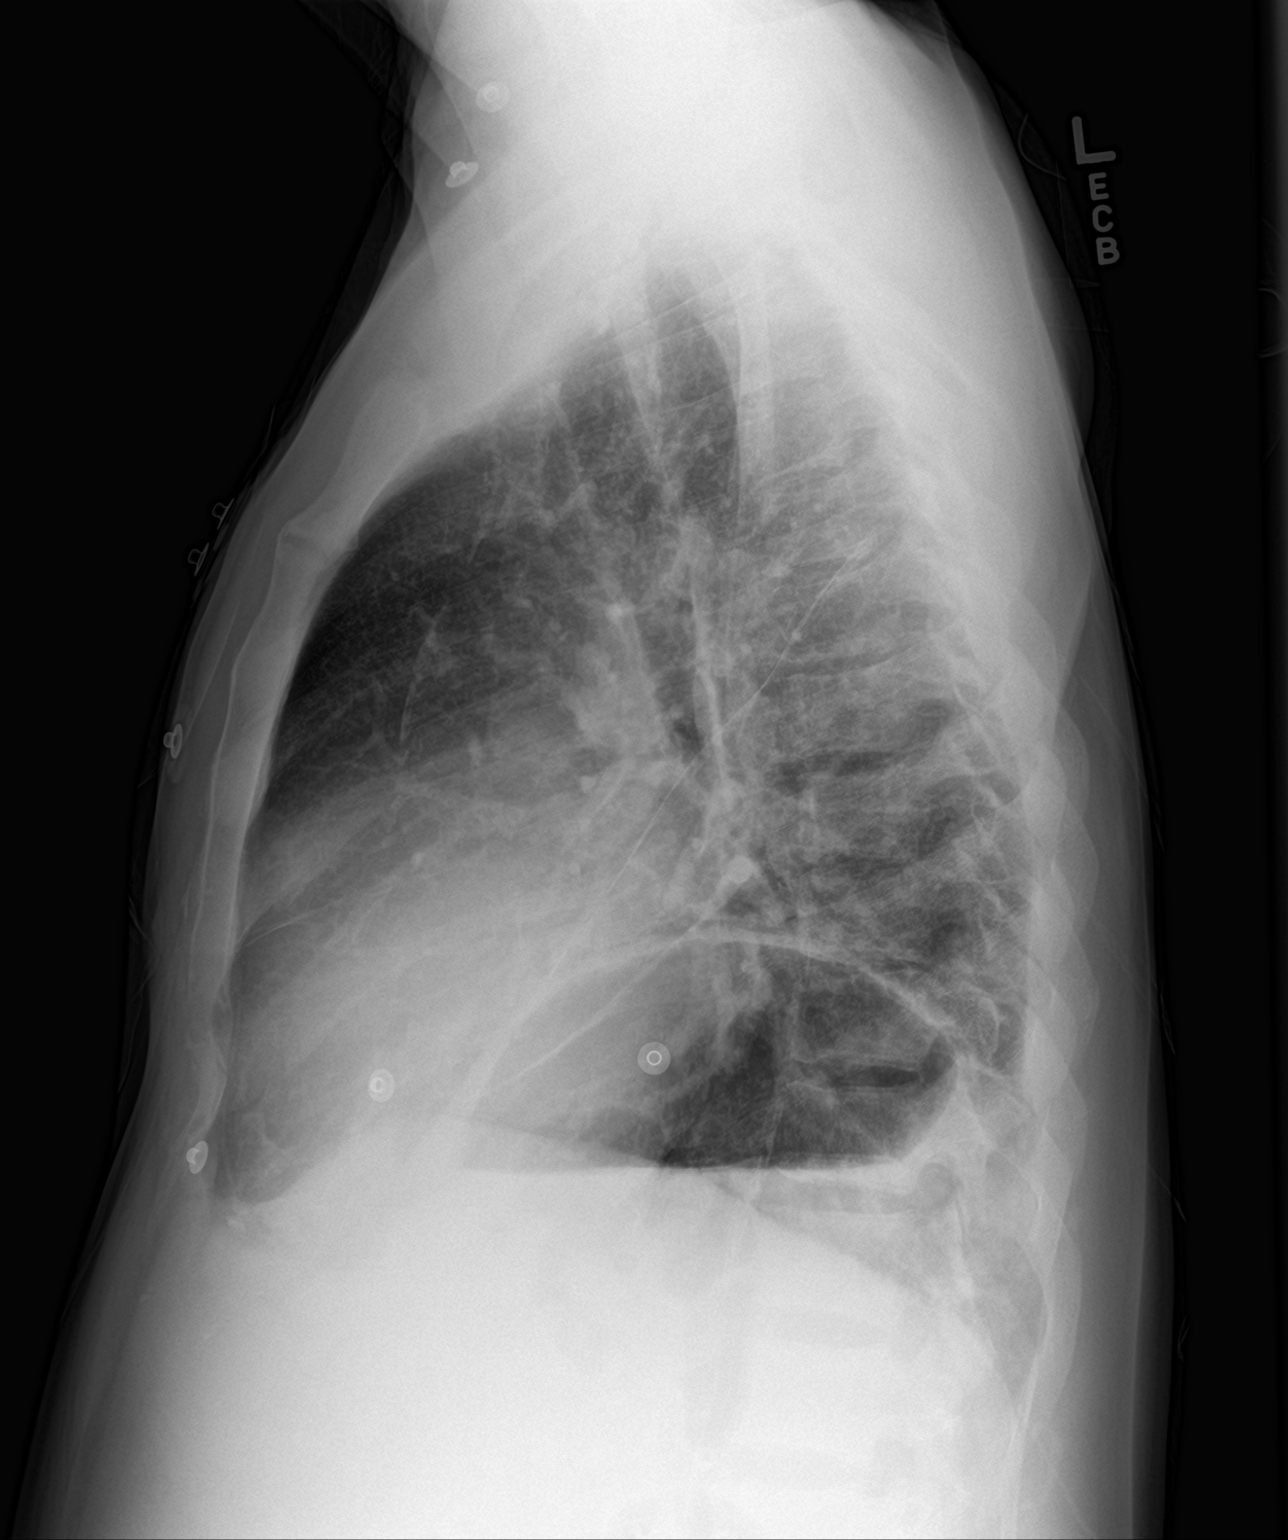

[2 of 2 positions shown; findings below may reference images not displayed]

FINDINGS: Stable cardiomegaly with aortic atherosclerosis and mild central
vascular congestion and interstitial edema consistent mild CHF. Left
basilar atelectasis is noted.
IMPRESSION: Stable cardiomegaly with mild CHF.  Aortic atherosclerosis.

## 2018-09-25 DIAGNOSIS — D631 Anemia in chronic kidney disease: Secondary | ICD-10-CM | POA: Diagnosis not present

## 2018-09-25 DIAGNOSIS — N2581 Secondary hyperparathyroidism of renal origin: Secondary | ICD-10-CM | POA: Diagnosis not present

## 2018-09-25 DIAGNOSIS — D509 Iron deficiency anemia, unspecified: Secondary | ICD-10-CM | POA: Diagnosis not present

## 2018-09-25 DIAGNOSIS — N186 End stage renal disease: Secondary | ICD-10-CM | POA: Diagnosis not present

## 2018-09-27 DIAGNOSIS — D631 Anemia in chronic kidney disease: Secondary | ICD-10-CM | POA: Diagnosis not present

## 2018-09-27 DIAGNOSIS — N186 End stage renal disease: Secondary | ICD-10-CM | POA: Diagnosis not present

## 2018-09-27 DIAGNOSIS — D509 Iron deficiency anemia, unspecified: Secondary | ICD-10-CM | POA: Diagnosis not present

## 2018-09-27 DIAGNOSIS — N2581 Secondary hyperparathyroidism of renal origin: Secondary | ICD-10-CM | POA: Diagnosis not present

## 2018-09-29 DIAGNOSIS — D631 Anemia in chronic kidney disease: Secondary | ICD-10-CM | POA: Diagnosis not present

## 2018-09-29 DIAGNOSIS — N2581 Secondary hyperparathyroidism of renal origin: Secondary | ICD-10-CM | POA: Diagnosis not present

## 2018-09-29 DIAGNOSIS — N186 End stage renal disease: Secondary | ICD-10-CM | POA: Diagnosis not present

## 2018-09-29 DIAGNOSIS — D509 Iron deficiency anemia, unspecified: Secondary | ICD-10-CM | POA: Diagnosis not present

## 2018-10-02 DIAGNOSIS — D631 Anemia in chronic kidney disease: Secondary | ICD-10-CM | POA: Diagnosis not present

## 2018-10-02 DIAGNOSIS — N186 End stage renal disease: Secondary | ICD-10-CM | POA: Diagnosis not present

## 2018-10-02 DIAGNOSIS — D509 Iron deficiency anemia, unspecified: Secondary | ICD-10-CM | POA: Diagnosis not present

## 2018-10-02 DIAGNOSIS — N2581 Secondary hyperparathyroidism of renal origin: Secondary | ICD-10-CM | POA: Diagnosis not present

## 2018-10-04 DIAGNOSIS — N186 End stage renal disease: Secondary | ICD-10-CM | POA: Diagnosis not present

## 2018-10-04 DIAGNOSIS — D631 Anemia in chronic kidney disease: Secondary | ICD-10-CM | POA: Diagnosis not present

## 2018-10-04 DIAGNOSIS — D509 Iron deficiency anemia, unspecified: Secondary | ICD-10-CM | POA: Diagnosis not present

## 2018-10-04 DIAGNOSIS — N2581 Secondary hyperparathyroidism of renal origin: Secondary | ICD-10-CM | POA: Diagnosis not present

## 2018-10-06 DIAGNOSIS — D631 Anemia in chronic kidney disease: Secondary | ICD-10-CM | POA: Diagnosis not present

## 2018-10-06 DIAGNOSIS — N2581 Secondary hyperparathyroidism of renal origin: Secondary | ICD-10-CM | POA: Diagnosis not present

## 2018-10-06 DIAGNOSIS — D509 Iron deficiency anemia, unspecified: Secondary | ICD-10-CM | POA: Diagnosis not present

## 2018-10-06 DIAGNOSIS — N186 End stage renal disease: Secondary | ICD-10-CM | POA: Diagnosis not present

## 2018-10-09 DIAGNOSIS — D509 Iron deficiency anemia, unspecified: Secondary | ICD-10-CM | POA: Diagnosis not present

## 2018-10-09 DIAGNOSIS — N186 End stage renal disease: Secondary | ICD-10-CM | POA: Diagnosis not present

## 2018-10-09 DIAGNOSIS — N2581 Secondary hyperparathyroidism of renal origin: Secondary | ICD-10-CM | POA: Diagnosis not present

## 2018-10-09 DIAGNOSIS — D631 Anemia in chronic kidney disease: Secondary | ICD-10-CM | POA: Diagnosis not present

## 2018-10-11 DIAGNOSIS — D631 Anemia in chronic kidney disease: Secondary | ICD-10-CM | POA: Diagnosis not present

## 2018-10-11 DIAGNOSIS — D509 Iron deficiency anemia, unspecified: Secondary | ICD-10-CM | POA: Diagnosis not present

## 2018-10-11 DIAGNOSIS — N2581 Secondary hyperparathyroidism of renal origin: Secondary | ICD-10-CM | POA: Diagnosis not present

## 2018-10-11 DIAGNOSIS — N186 End stage renal disease: Secondary | ICD-10-CM | POA: Diagnosis not present

## 2018-10-11 DIAGNOSIS — Z23 Encounter for immunization: Secondary | ICD-10-CM | POA: Diagnosis not present

## 2018-10-11 DIAGNOSIS — T82868A Thrombosis of vascular prosthetic devices, implants and grafts, initial encounter: Secondary | ICD-10-CM | POA: Diagnosis not present

## 2018-10-11 DIAGNOSIS — I871 Compression of vein: Secondary | ICD-10-CM | POA: Diagnosis not present

## 2018-10-11 DIAGNOSIS — I120 Hypertensive chronic kidney disease with stage 5 chronic kidney disease or end stage renal disease: Secondary | ICD-10-CM | POA: Diagnosis not present

## 2018-10-11 DIAGNOSIS — Z992 Dependence on renal dialysis: Secondary | ICD-10-CM | POA: Diagnosis not present

## 2018-10-13 DIAGNOSIS — D509 Iron deficiency anemia, unspecified: Secondary | ICD-10-CM | POA: Diagnosis not present

## 2018-10-13 DIAGNOSIS — N186 End stage renal disease: Secondary | ICD-10-CM | POA: Diagnosis not present

## 2018-10-13 DIAGNOSIS — N2581 Secondary hyperparathyroidism of renal origin: Secondary | ICD-10-CM | POA: Diagnosis not present

## 2018-10-13 DIAGNOSIS — Z23 Encounter for immunization: Secondary | ICD-10-CM | POA: Diagnosis not present

## 2018-10-13 DIAGNOSIS — D631 Anemia in chronic kidney disease: Secondary | ICD-10-CM | POA: Diagnosis not present

## 2018-10-16 DIAGNOSIS — Z23 Encounter for immunization: Secondary | ICD-10-CM | POA: Diagnosis not present

## 2018-10-16 DIAGNOSIS — D631 Anemia in chronic kidney disease: Secondary | ICD-10-CM | POA: Diagnosis not present

## 2018-10-16 DIAGNOSIS — N2581 Secondary hyperparathyroidism of renal origin: Secondary | ICD-10-CM | POA: Diagnosis not present

## 2018-10-16 DIAGNOSIS — N186 End stage renal disease: Secondary | ICD-10-CM | POA: Diagnosis not present

## 2018-10-16 DIAGNOSIS — D509 Iron deficiency anemia, unspecified: Secondary | ICD-10-CM | POA: Diagnosis not present

## 2018-10-18 DIAGNOSIS — D509 Iron deficiency anemia, unspecified: Secondary | ICD-10-CM | POA: Diagnosis not present

## 2018-10-18 DIAGNOSIS — Z23 Encounter for immunization: Secondary | ICD-10-CM | POA: Diagnosis not present

## 2018-10-18 DIAGNOSIS — D631 Anemia in chronic kidney disease: Secondary | ICD-10-CM | POA: Diagnosis not present

## 2018-10-18 DIAGNOSIS — N2581 Secondary hyperparathyroidism of renal origin: Secondary | ICD-10-CM | POA: Diagnosis not present

## 2018-10-18 DIAGNOSIS — N186 End stage renal disease: Secondary | ICD-10-CM | POA: Diagnosis not present

## 2018-10-19 ENCOUNTER — Encounter: Payer: Self-pay | Admitting: Cardiology

## 2018-10-20 DIAGNOSIS — Z23 Encounter for immunization: Secondary | ICD-10-CM | POA: Diagnosis not present

## 2018-10-20 DIAGNOSIS — N186 End stage renal disease: Secondary | ICD-10-CM | POA: Diagnosis not present

## 2018-10-20 DIAGNOSIS — D631 Anemia in chronic kidney disease: Secondary | ICD-10-CM | POA: Diagnosis not present

## 2018-10-20 DIAGNOSIS — D509 Iron deficiency anemia, unspecified: Secondary | ICD-10-CM | POA: Diagnosis not present

## 2018-10-20 DIAGNOSIS — N2581 Secondary hyperparathyroidism of renal origin: Secondary | ICD-10-CM | POA: Diagnosis not present

## 2018-10-21 NOTE — Progress Notes (Signed)
Subjective:  Primary Physician:  [name], FNP  Patient ID: [name], male    DOB: 1961/06/26, 58 y.o.   MRN: [id number]  This visit type was conducted due to national recommendations for restrictions regarding the COVID-19 Pandemic (e.g. social distancing).  This format is felt to be most appropriate for this patient at this time.  All issues noted in this document were discussed and addressed.  No physical exam was performed (except for noted visual exam findings with Telehealth visits - very limited).  The patient has consented to conduct a Telehealth visit and understands insurance will be billed.   I connected with patient, on 10/24/18  by a telemedicine application and verified that I am speaking with the correct person using two identifiers.     I discussed the limitations of evaluation and management by telemedicine and the availability of in person appointments. The patient expressed understanding and agreed to proceed.   I have discussed with patient regarding the safety during COVID Pandemic and steps and precautions including social distancing with the patient.    Chief Complaint  Patient presents with  . Coronary Artery Disease    6 months f/u     HPI: [name]  is a 58 y.o. male  who presents for a Follow-up for Coronary artery disease. , and paroxysmal atrial fibrillation with wide QRS due to IVCD. Patient has very complex medical problems and has been noncompliant of therapy. He has history of severe COPD, cor pulmonale with pulmonary hypertension, known coronary artery disease;  Coronary angiogram 02/22/2017: No significant disease on left, mid RCA high-grade stenosis status post 3.5 x 23 mm Xience Alpine DES., hypertensive cardiomyopathy, dietary and medication noncompliance, end-stage renal disease on hemodialysis, chronic hepatitis B and C, tobacco use , smokes 1/2 ppd, h/o marijuana use and prior cocaine use quit in 2017, admitted to  hospital again in August 2018 with parox. Afib, RVR with wide QRS as he has baseline IVCD with wide QRS. I have reviewed the hospital records including EKGs and rhythm strips. Pt. is not on Warfarin or NOACs due to concerns for noncompliance. He is taking low dose aspirin.  Patient denies any complaints of chest pain, tightness or pressure. There is no dyspnea on routine activities, he is able to walk up to 200 feet. No orthopnea or PND. No history of swelling on the legs. He has occasional palpitation but controlled with metoprolol therapy. No history of severe dizziness, near-syncope or syncope. No history of any active bleeding.  Past Medical History:  Diagnosis Date  . Anemia   . Anxiety   . Arthritis    left shoulder  . Atherosclerosis of aorta ([place])   . Cardiomegaly   . Chest pain    DATE UNKNOWN, C/O PERIODICALLY  . Cocaine abuse ([place])   . COPD exacerbation ([place]) 08/17/2016  . Coronary artery disease    stent 02/22/17  . ESRD (end stage renal disease) on dialysis ([place])    "[name]; MWF" (07/04/2017)  . GERD (gastroesophageal reflux disease)    DATE UNKNOWN  . Hemorrhoids   . Hepatitis B, chronic ([place])   . Hepatitis C   . History of kidney stones   . Hyperkalemia   . Hypertension   . Kidney failure   . Metabolic bone disease    Patient denies  . Mitral stenosis   . Myocardial infarction ([place])   . Pneumonia   . Pulmonary edema   . Solitary  rectal ulcer syndrome 07/2017   at flex [facility] for rectal bleeding  . Tubular adenoma of colon     Past Surgical History:  Procedure Laterality Date  . A/V FISTULAGRAM Left 05/26/2017   Procedure: A/V FISTULAGRAM;  Surgeon: Conrad Red Rock, MD;  Location: [place] [facility];  Service: Cardiovascular;  Laterality: Left;  . A/V FISTULAGRAM Right 11/18/2017   Procedure: A/V FISTULAGRAM - Right Arm;  Surgeon: Elam Dutch, MD;  Location: [place] [facility];  Service: Cardiovascular;  Laterality: Right;  . APPLICATION OF WOUND VAC  Left 06/14/2017   Procedure: APPLICATION OF WOUND VAC;  Surgeon: Katha Cabal, MD;  Location: [facility];  Service: Vascular;  Laterality: Left;  . AV FISTULA PLACEMENT  2012   BELIEVED WAS PLACED IN JUNE  . AV FISTULA PLACEMENT Right 08/09/2017   Procedure: Creation Right arm ARTERIOVENOUS BRACHIOCEPOHALIC FISTULA;  Surgeon: Elam Dutch, MD;  Location: [facility] OR;  Service: Vascular;  Laterality: Right;  . AV FISTULA PLACEMENT Right 11/22/2017   Procedure: INSERTION OF ARTERIOVENOUS (AV) GORE-TEX GRAFT RIGHT UPPER ARM;  Surgeon: Elam Dutch, MD;  Location: [place];  Service: Vascular;  Laterality: Right;  . BIOPSY  01/25/2018   Procedure: BIOPSY;  Surgeon: Jerene Bears, MD;  Location: [facility];  Service: Gastroenterology;;  . COLONOSCOPY    . COLONOSCOPY WITH PROPOFOL N/A 01/25/2018   Procedure: COLONOSCOPY WITH PROPOFOL;  Surgeon: Jerene Bears, MD;  Location: [place];  Service: Gastroenterology;  Laterality: N/A;  . CORONARY STENT INTERVENTION N/A 02/22/2017   Procedure: CORONARY STENT INTERVENTION;  Surgeon: Nigel Mormon, MD;  Location: [place] [facility];  Service: Cardiovascular;  Laterality: N/A;  . ESOPHAGOGASTRODUODENOSCOPY (EGD) WITH PROPOFOL N/A 01/25/2018   Procedure: ESOPHAGOGASTRODUODENOSCOPY (EGD) WITH PROPOFOL;  Surgeon: Jerene Bears, MD;  Location: [place];  Service: Gastroenterology;  Laterality: N/A;  . FLEXIBLE SIGMOIDOSCOPY N/A 07/15/2017   Procedure: FLEXIBLE SIGMOIDOSCOPY;  Surgeon: Carol Ada, MD;  Location: [place];  Service: Endoscopy;  Laterality: N/A;  . HEMORRHOID BANDING    . I&D EXTREMITY Left 06/01/2017   Procedure: IRRIGATION AND DEBRIDEMENT LEFT ARM HEMATOMA WITH LIGATION OF LEFT ARM AV FISTULA;  Surgeon: Elam Dutch, MD;  Location: [place];  Service: Vascular;  Laterality: Left;  . I&D EXTREMITY Left 06/14/2017   Procedure: IRRIGATION AND DEBRIDEMENT EXTREMITY;  Surgeon: Katha Cabal, MD;  Location: [facility];  Service:  Vascular;  Laterality: Left;  . INSERTION OF DIALYSIS CATHETER  05/30/2017  . INSERTION OF DIALYSIS CATHETER N/A 05/30/2017   Procedure: INSERTION OF DIALYSIS CATHETER;  Surgeon: Elam Dutch, MD;  Location: [place];  Service: Vascular;  Laterality: N/A;  . IR PARACENTESIS  08/30/2017  . IR PARACENTESIS  09/29/2017  . IR PARACENTESIS  10/28/2017  . IR PARACENTESIS  11/09/2017  . IR PARACENTESIS  11/16/2017  . IR PARACENTESIS  11/28/2017  . IR PARACENTESIS  12/01/2017  . IR PARACENTESIS  12/06/2017  . IR PARACENTESIS  01/03/2018  . IR PARACENTESIS  01/23/2018  . IR PARACENTESIS  02/07/2018  . IR PARACENTESIS  02/21/2018  . IR PARACENTESIS  03/06/2018  . IR PARACENTESIS  03/17/2018  . IR PARACENTESIS  04/04/2018  . IR RADIOLOGIST EVAL & MGMT  02/14/2018  . LEFT HEART CATH AND CORONARY ANGIOGRAPHY N/A 02/22/2017   Procedure: LEFT HEART CATH AND CORONARY ANGIOGRAPHY;  Surgeon: Nigel Mormon, MD;  Location: [place] [facility];  Service: Cardiovascular;  Laterality: N/A;  . LIGATION OF  ARTERIOVENOUS  FISTULA Left 07/14/7515   Procedure: Plication of Left Arm Arteriovenous Fistula;  Surgeon: Elam Dutch, MD;  Location: [place];  Service: Vascular;  Laterality: Left;  . POLYPECTOMY    . POLYPECTOMY  01/25/2018   Procedure: POLYPECTOMY;  Surgeon: Jerene Bears, MD;  Location: [place];  Service: Gastroenterology;;  . REVISON OF ARTERIOVENOUS FISTULA Left 0/07/7492   Procedure: PLICATION OF DISTAL ANEURYSMAL SEGEMENT OF LEFT UPPER ARM ARTERIOVENOUS FISTULA;  Surgeon: Elam Dutch, MD;  Location: [place];  Service: Vascular;  Laterality: Left;  . REVISON OF ARTERIOVENOUS FISTULA Left 4/96/7591   Procedure: Plication of Left Upper Arm Fistula ;  Surgeon: Waynetta Sandy, MD;  Location: [place];  Service: Vascular;  Laterality: Left;  . SKIN GRAFT SPLIT THICKNESS LEG / FOOT Left    SKIN GRAFT SPLIT THICKNESS LEFT ARM DONOR SITE: LEFT ANTERIOR THIGH  . SKIN SPLIT GRAFT Left 07/04/2017    Procedure: SKIN GRAFT SPLIT THICKNESS LEFT ARM DONOR SITE: LEFT ANTERIOR THIGH;  Surgeon: Elam Dutch, MD;  Location: [place];  Service: Vascular;  Laterality: Left;  . THROMBECTOMY W/ EMBOLECTOMY Left 06/05/2017   Procedure: EXPLORATION OF LEFT ARM FOR BLEEDING; OVERSEWED PROXIMAL FISTULA;  Surgeon: Angelia Mould, MD;  Location: [place];  Service: Vascular;  Laterality: Left;  . WOUND EXPLORATION Left 06/03/2017   Procedure: WOUND EXPLORATION WITH WOUND VAC APPLICATION TO LEFT ARM;  Surgeon: Angelia Mould, MD;  Location: [facility];  Service: Vascular;  Laterality: Left;    Social History   Socioeconomic History  . Marital status: Single    Spouse name: Not on file  . Number of children: 3  . Years of education: 10  . Highest education level: Not on file  Occupational History  . Occupation: Unemployed  Social Needs  . Financial resource strain: Not on file  . Food insecurity:    Worry: Not on file    Inability: Not on file  . Transportation needs:    Medical: Not on file    Non-medical: Not on file  Tobacco Use  . Smoking status: Current Every Day Smoker    Packs/day: 0.50    Years: 43.00    Pack years: 21.50    Types: Cigarettes    Start date: 08/13/1973  . Smokeless tobacco: Never Used  Substance and Sexual Activity  . Alcohol use: Not Currently    Frequency: Never    Comment: quit drinking in 2017  . Drug use: Yes    Types: Marijuana  . Sexual activity: Not on file    Comment: "NOT LATELY" on cocaine  Lifestyle  . Physical activity:    Days per week: Not on file    Minutes per session: Not on file  . Stress: Not on file  Relationships  . Social connections:    Talks on phone: Not on file    Gets together: Not on file    Attends religious service: Not on file    Active member of club or organization: Not on file    Attends meetings of clubs or organizations: Not on file    Relationship status: Not on file  . Intimate partner violence:    Fear of  current or ex partner: Not on file    Emotionally abused: Not on file    Physically abused: Not on file    Forced sexual activity: Not on file  Other Topics Concern  . Not on file  Social History Narrative  Lives alone   Caffeine use: Coffee-rare   Soda- daily      [facility] (03/10/17):   Originally from [facility]. Previously worked trimming trees. No pets currently. No bird or mold exposure.     Current Outpatient Medications on File Prior to Visit  Medication Sig Dispense Refill  . albuterol (VENTOLIN HFA) 108 (90 Base) MCG/ACT inhaler Inhale 2 puffs into the lungs every 6 (six) hours as needed for wheezing or shortness of breath.    Marland Kitchen aspirin 81 MG EC tablet Take 1 tablet (81 mg total) by mouth daily. 60 tablet 0  . gabapentin (NEURONTIN) 100 MG capsule Take 100 mg by mouth 2 (two) times daily.  0  . hydrALAZINE (APRESOLINE) 100 MG tablet Take 1 tablet (100 mg total) by mouth 2 (two) times daily as needed. Taking after HD on Dialysis days (Patient taking differently: Take 100 mg by mouth 2 (two) times daily as needed (high blood pressure). If needed on Monday, Wednesday, Friday take after dialysis) 30 tablet 0  . lactulose (CHRONULAC) 10 GM/15ML solution Take 45 mLs (30 g total) by mouth 2 (two) times daily. 1892 mL 0  . metoprolol tartrate (LOPRESSOR) 25 MG tablet Take 1 tablet (25 mg total) by mouth 2 (two) times daily. 60 tablet 0  . mirtazapine (REMERON) 30 MG tablet Take 30 mg by mouth at bedtime.    . pantoprazole (PROTONIX) 40 MG tablet Take 1 tablet (40 mg total) by mouth daily at 12 noon. (Patient taking differently: Take 40 mg by mouth See admin instructions. Take one tablet (40 mg) by mouth in the morning on Sunday, Tuesday, Thursday, Saturday, take one tablet (40 mg) after dialysis on Monday, Wednesday, Friday. May also take one tablet (40 mg) in the evening as needed for acid reflux.) 30 tablet 0  . sevelamer carbonate (RENVELA) 800 MG tablet Take 4 tablets with meals  And 2  tablets twice daily with snacks (Patient taking differently: Take 2,400-3,200 mg by mouth See admin instructions. Take 3 - 4 tablets (2400 - 3200 mg) up to five times daily with meals or snacks)    . traZODone (DESYREL) 50 MG tablet Take 50 mg by mouth at bedtime as needed for sleep.     . bisacodyl (DULCOLAX) 5 MG EC tablet Take 5 mg by mouth daily as needed for moderate constipation.     No current facility-administered medications on file prior to visit.     Review of Systems  Constitutional: Negative for fever.  HENT: Negative for nosebleeds.   Eyes: Negative for blurred vision.  Respiratory: Positive for shortness of breath. Negative for cough.   Gastrointestinal: Negative for abdominal pain, nausea and vomiting.  Genitourinary: Negative for dysuria.  Musculoskeletal: Negative for myalgias.  Skin: Negative for itching and rash.  Neurological: Negative for dizziness, seizures and loss of consciousness.  Psychiatric/Behavioral: The patient is not nervous/anxious.        Objective:  Height 5\' 9"  (1.753 m), weight 158 lb (71.7 kg). Body mass index is 23.33 kg/m.  Physical Exam  Patient is alert and oriented 3.  He appeared comfortable while talking to me.  No further physical examination was possible as it was a telemedicine visit.   CARDIAC STUDIES:  Coronary Angiogram [02/22/2017]: 02/22/2017: No significant disease on left, mid RCA high-grade stenosis status post 3.5 x 23 mm Xience Alpine DES. Carotid Doppler [10/28/2016]: Stenosis in the right common carotid artery (<50%) with severe heteregenous plaque. Stenosis in the left internal carotid artery (  16-49%). Severe heteregenous plaque in the left common carotid artery with < 50% stenosis. Mild stenosis in the left external carotid artery (<50%). Antegrade vertebral artery flow. Follow up in one year is appropriate if clinically indicated. Echocardiogram  04/03/2018: Left ventricle cavity is normal in size. Severe concentric  remodeling of the left ventricle. Visual EF is 45-50% with mild global hypokinesis. Abnormal septal wall motion due to right ventricular volume and pressure overload. Indeterminate diastolic filling pattern due to MV calcification. Left atrial cavity is moderately dilated at 4.5 cm and volume of 56 mL. Right atrial cavity is moderately dilated. Right ventricle cavity is mildly dilated. Normal right ventricular function. Trileaflet aortic valve with no regurgitation noted. Mild aortic valve leaflet calcification. Mild aortic valve stenosis. Aortic valve peak pressure gradient of 20 and mean gradient of 12 mmHg, calculated aortic valve area 1.69 cm. Moderate calcification of the mitral valve annulus. Mild mitral valve leaflet calcification. Mild mitral valve stenosis. No mitral valve regurgitation noted. Mitral valve peak pressure gradient of 21 and mean gradient of 8.4 mmHg, calculated mitral valve area 2.5 cm. Moderate tricuspid regurgitation due to annular dilatation. Moderate to severe pulmonary hypertension. PA systolic pressure 66 mm with CVP 15 mm Hg. IVC is dilated with poor inspiration collapse consistent with elevated right atrial pressure. Compared to the study done on 03/21/2014, previously EF 66%, right ventricular findings, valvular findings and pulmonary hypertension are new.  Assessment & Recommendations:  Atherosclerosis of native coronary artery of native heart without angina pectoris - Cath- 02/22/2017- No significant disease on left, mid RCA high-grade stenosis status post 3.5 x 23 mm Xience Alpine DES.  Paroxysmal atrial fibrillation (HCC) - with wide QRS due to IVCD  Hypertension with heart disease  CKD (chronic kidney disease) stage V requiring chronic dialysis (HCC)  Cor pulmonale, chronic (HCC)  Chronic obstructive pulmonary disease, unspecified COPD type ([place])  Smoking  Laboratory Exam:  CBC Latest Ref Rng & Units 09/20/2018 09/12/2018 08/20/2018  WBC 4.0 - 10.5 K/uL  7.9 7.5 9.3  Hemoglobin 13.0 - 17.0 g/dL 10.0(L) 10.9(L) 10.8(L)  Hematocrit 39.0 - 52.0 % 28.2(L) 32.4(L) 31.9(L)  Platelets 150 - 400 K/uL 114(L) 85(L) 129(L)   CMP Latest Ref Rng & Units 09/20/2018 09/13/2018 09/12/2018  Glucose 70 - 99 mg/dL 125(H) 63(L) 81  BUN 6 - 20 mg/dL 40(H) 41(H) 19  Creatinine 0.61 - 1.24 mg/dL 10.32(H) 8.66(H) 6.75(H)  Sodium 135 - 145 mmol/L 129(L) 130(L) 131(L)  Potassium 3.5 - 5.1 mmol/L 5.3(H) 4.6 3.9  Chloride 98 - 111 mmol/L 93(L) 93(L) 89(L)  CO2 22 - 32 mmol/L 20(L) 20(L) 26  Calcium 8.9 - 10.3 mg/dL 8.2(L) 8.0(L) 8.5(L)  Total Protein 6.5 - 8.1 g/dL 7.9 - 8.7(H)  Total Bilirubin 0.3 - 1.2 mg/dL 1.0 - 1.8(H)  Alkaline Phos 38 - 126 U/L 136(H) - 125  AST 15 - 41 U/L 64(H) - 39  ALT 0 - 44 U/L 33 - 19   Lipid Panel     Component Value Date/Time   CHOL 103 08/17/2016 0134   TRIG 97 08/17/2016 0134   HDL 34 (L) 08/17/2016 0134   CHOLHDL 3.0 08/17/2016 0134   VLDL 19 08/17/2016 0134   LDLCALC 50 08/17/2016 0134     Recommendation:  CAD is stable, no angina. Patient has paroxysmal atrial fibrillation which has been  stable and patient has very minimal symptoms. We will continue metoprolol. As mentioned above, he is not on anticoagulation therapy because of noncompliance. He was advised to  continue enteric-coated aspirin 81 mg daily.  Blood pressure has been fairly controlled.He will continue present medications. He should be on statins as he has significant atherosclerotic burden, patient has history of cirrhosis and has elevated SGOT. His last lipid panel was in 2018.  I advised him to repeat lipid panel but patient said that I don't want to check my cholesterol, I don't want to take any medications.  "I have been eating cheerios and my cholesterol is good". Non compliance is a major issue. He is still smoking, I again had a long talk with him about quitting smoking, pt. does not appear motivated.  He will return for follow-up after 6 months but  call us earlier if there are any cardiac problems.   Despina Hick, MD, [facility] 10/24/2018, 12:14 PM [facility] [place] Pager: [phone] Office: [phone] If no answer Cell [phone]

## 2018-10-22 IMAGING — DX DG CHEST 2V
2 series · 2 of 2 positions shown · non-contrast
Comparison: 03/24/2017

CLINICAL DATA: Nausea during dialysis today. Now with abdominal
distension and mild dyspnea.

EXAM:
CHEST  2 VIEW

[x chest ap]
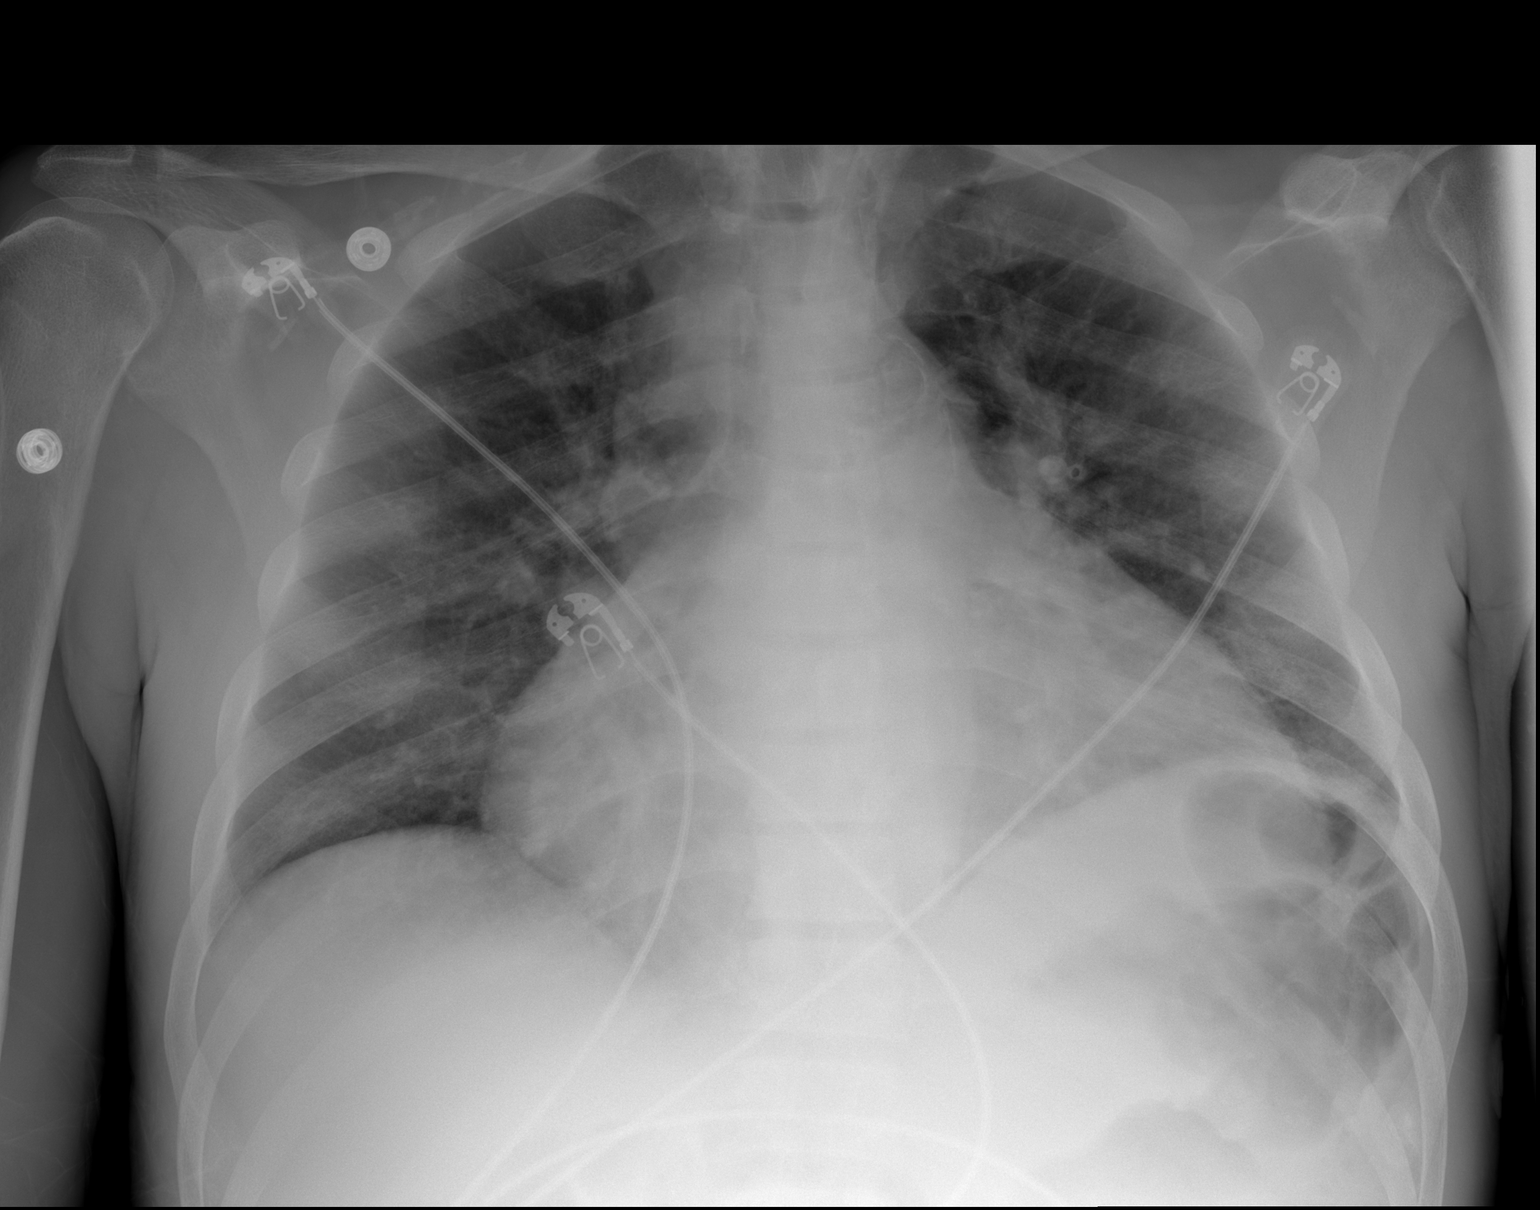

[w chest lat]
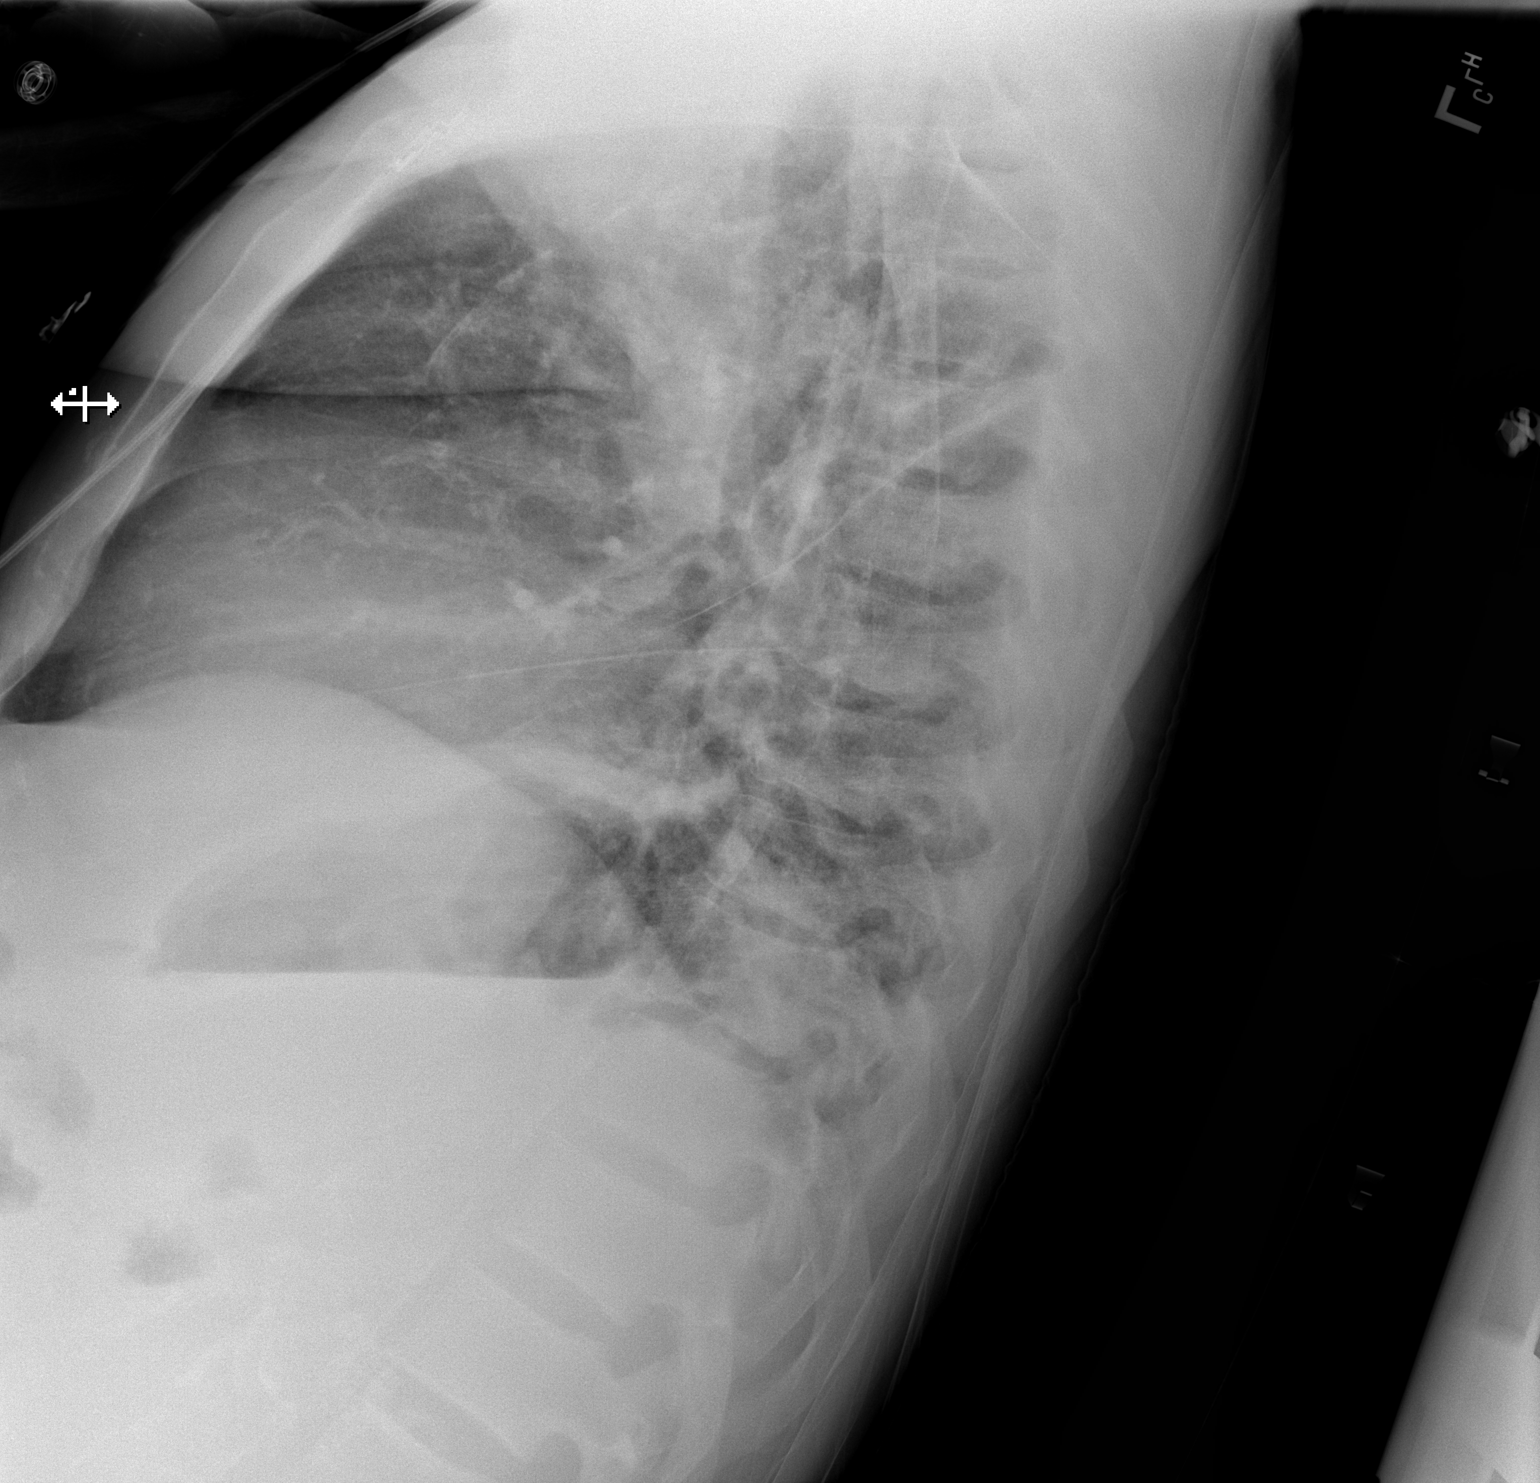

[2 of 2 positions shown; findings below may reference images not displayed]

FINDINGS: Moderate enlargement of the cardiac silhouette, unchanged. Mild
vascular and interstitial prominence, without focal airspace
consolidation. No effusions. Sclerosis of multiple bones consistent
with chronic renal disease.
IMPRESSION: Mild vascular and interstitial prominence. Unchanged cardiomegaly.
No consolidation or effusion.

## 2018-10-23 DIAGNOSIS — Z23 Encounter for immunization: Secondary | ICD-10-CM | POA: Diagnosis not present

## 2018-10-23 DIAGNOSIS — D631 Anemia in chronic kidney disease: Secondary | ICD-10-CM | POA: Diagnosis not present

## 2018-10-23 DIAGNOSIS — N2581 Secondary hyperparathyroidism of renal origin: Secondary | ICD-10-CM | POA: Diagnosis not present

## 2018-10-23 DIAGNOSIS — N186 End stage renal disease: Secondary | ICD-10-CM | POA: Diagnosis not present

## 2018-10-23 DIAGNOSIS — D509 Iron deficiency anemia, unspecified: Secondary | ICD-10-CM | POA: Diagnosis not present

## 2018-10-24 ENCOUNTER — Other Ambulatory Visit: Payer: Self-pay

## 2018-10-24 ENCOUNTER — Ambulatory Visit (INDEPENDENT_AMBULATORY_CARE_PROVIDER_SITE_OTHER): Payer: Medicare Other | Admitting: Cardiology

## 2018-10-24 VITALS — Ht 69.0 in | Wt 158.0 lb

## 2018-10-24 DIAGNOSIS — J449 Chronic obstructive pulmonary disease, unspecified: Secondary | ICD-10-CM | POA: Diagnosis not present

## 2018-10-24 DIAGNOSIS — I2781 Cor pulmonale (chronic): Secondary | ICD-10-CM

## 2018-10-24 DIAGNOSIS — I119 Hypertensive heart disease without heart failure: Secondary | ICD-10-CM | POA: Diagnosis not present

## 2018-10-24 DIAGNOSIS — F17200 Nicotine dependence, unspecified, uncomplicated: Secondary | ICD-10-CM

## 2018-10-24 DIAGNOSIS — Z992 Dependence on renal dialysis: Secondary | ICD-10-CM

## 2018-10-24 DIAGNOSIS — N186 End stage renal disease: Secondary | ICD-10-CM | POA: Diagnosis not present

## 2018-10-24 DIAGNOSIS — I2510 Atherosclerotic heart disease of native coronary artery without angina pectoris: Secondary | ICD-10-CM

## 2018-10-24 DIAGNOSIS — I480 Paroxysmal atrial fibrillation: Secondary | ICD-10-CM | POA: Diagnosis not present

## 2018-10-24 IMAGING — CR DG CHEST 1V PORT
1 series · 1 of 1 positions shown · non-contrast
Comparison: Radiographs April 22, 2017.

CLINICAL DATA: Shortness of breath.

EXAM:
PORTABLE CHEST 1 VIEW

[AP]
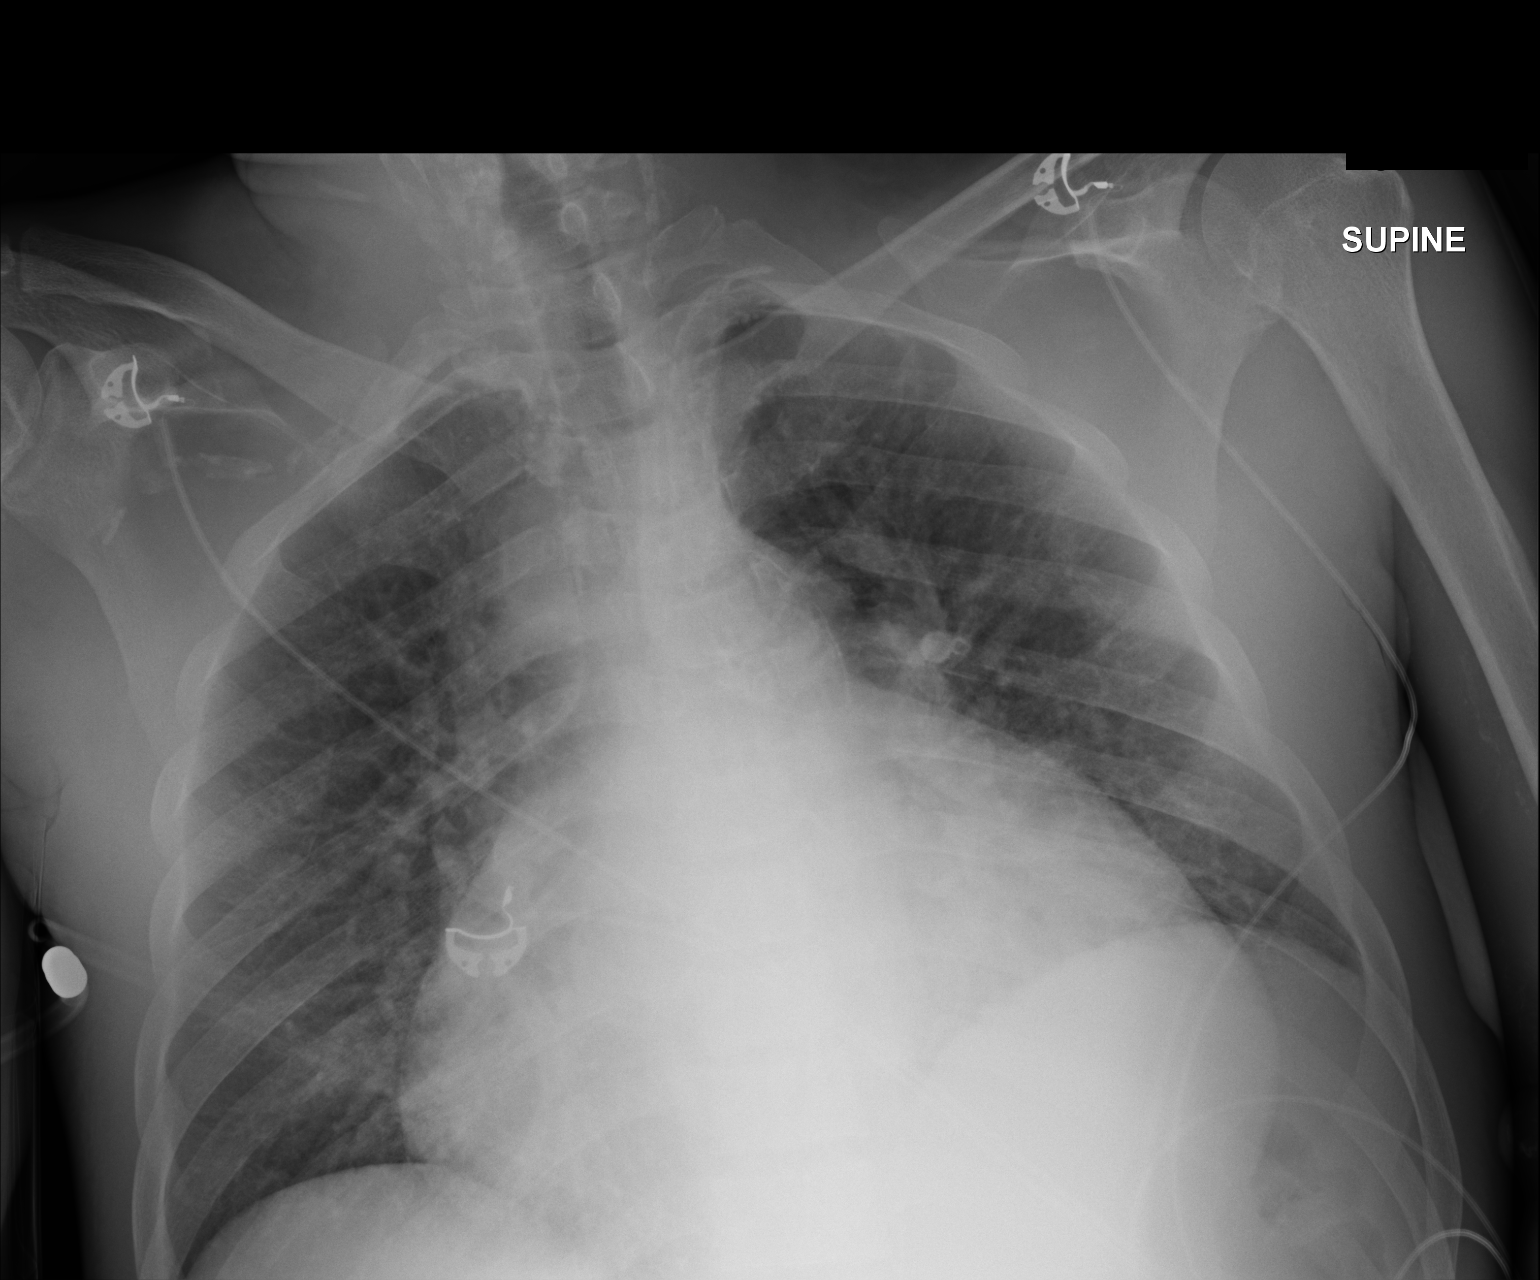

[1 of 1 positions shown; findings below may reference images not displayed]

FINDINGS: Stable cardiomegaly is noted. No pneumothorax or pleural effusion is
noted. Both lungs are clear. The visualized skeletal structures are
unremarkable.
IMPRESSION: No acute cardiopulmonary abnormality seen.

## 2018-10-25 DIAGNOSIS — N2581 Secondary hyperparathyroidism of renal origin: Secondary | ICD-10-CM | POA: Diagnosis not present

## 2018-10-25 DIAGNOSIS — D509 Iron deficiency anemia, unspecified: Secondary | ICD-10-CM | POA: Diagnosis not present

## 2018-10-25 DIAGNOSIS — D631 Anemia in chronic kidney disease: Secondary | ICD-10-CM | POA: Diagnosis not present

## 2018-10-25 DIAGNOSIS — Z23 Encounter for immunization: Secondary | ICD-10-CM | POA: Diagnosis not present

## 2018-10-25 DIAGNOSIS — N186 End stage renal disease: Secondary | ICD-10-CM | POA: Diagnosis not present

## 2018-10-27 DIAGNOSIS — D631 Anemia in chronic kidney disease: Secondary | ICD-10-CM | POA: Diagnosis not present

## 2018-10-27 DIAGNOSIS — N2581 Secondary hyperparathyroidism of renal origin: Secondary | ICD-10-CM | POA: Diagnosis not present

## 2018-10-27 DIAGNOSIS — Z23 Encounter for immunization: Secondary | ICD-10-CM | POA: Diagnosis not present

## 2018-10-27 DIAGNOSIS — N186 End stage renal disease: Secondary | ICD-10-CM | POA: Diagnosis not present

## 2018-10-27 DIAGNOSIS — D509 Iron deficiency anemia, unspecified: Secondary | ICD-10-CM | POA: Diagnosis not present

## 2018-10-30 DIAGNOSIS — D631 Anemia in chronic kidney disease: Secondary | ICD-10-CM | POA: Diagnosis not present

## 2018-10-30 DIAGNOSIS — N186 End stage renal disease: Secondary | ICD-10-CM | POA: Diagnosis not present

## 2018-10-30 DIAGNOSIS — N2581 Secondary hyperparathyroidism of renal origin: Secondary | ICD-10-CM | POA: Diagnosis not present

## 2018-10-30 DIAGNOSIS — Z23 Encounter for immunization: Secondary | ICD-10-CM | POA: Diagnosis not present

## 2018-10-30 DIAGNOSIS — D509 Iron deficiency anemia, unspecified: Secondary | ICD-10-CM | POA: Diagnosis not present

## 2018-11-01 DIAGNOSIS — D631 Anemia in chronic kidney disease: Secondary | ICD-10-CM | POA: Diagnosis not present

## 2018-11-01 DIAGNOSIS — Z23 Encounter for immunization: Secondary | ICD-10-CM | POA: Diagnosis not present

## 2018-11-01 DIAGNOSIS — N186 End stage renal disease: Secondary | ICD-10-CM | POA: Diagnosis not present

## 2018-11-01 DIAGNOSIS — D509 Iron deficiency anemia, unspecified: Secondary | ICD-10-CM | POA: Diagnosis not present

## 2018-11-01 DIAGNOSIS — N2581 Secondary hyperparathyroidism of renal origin: Secondary | ICD-10-CM | POA: Diagnosis not present

## 2018-11-01 IMAGING — DX DG CHEST 1V PORT
1 series · 1 of 1 positions shown · non-contrast
Comparison: 04/24/2017 chest radiograph.

CLINICAL DATA: 56 y/o M; shortness of breath with cough and
congestion.

EXAM:
PORTABLE CHEST 1 VIEW

[chest ap]
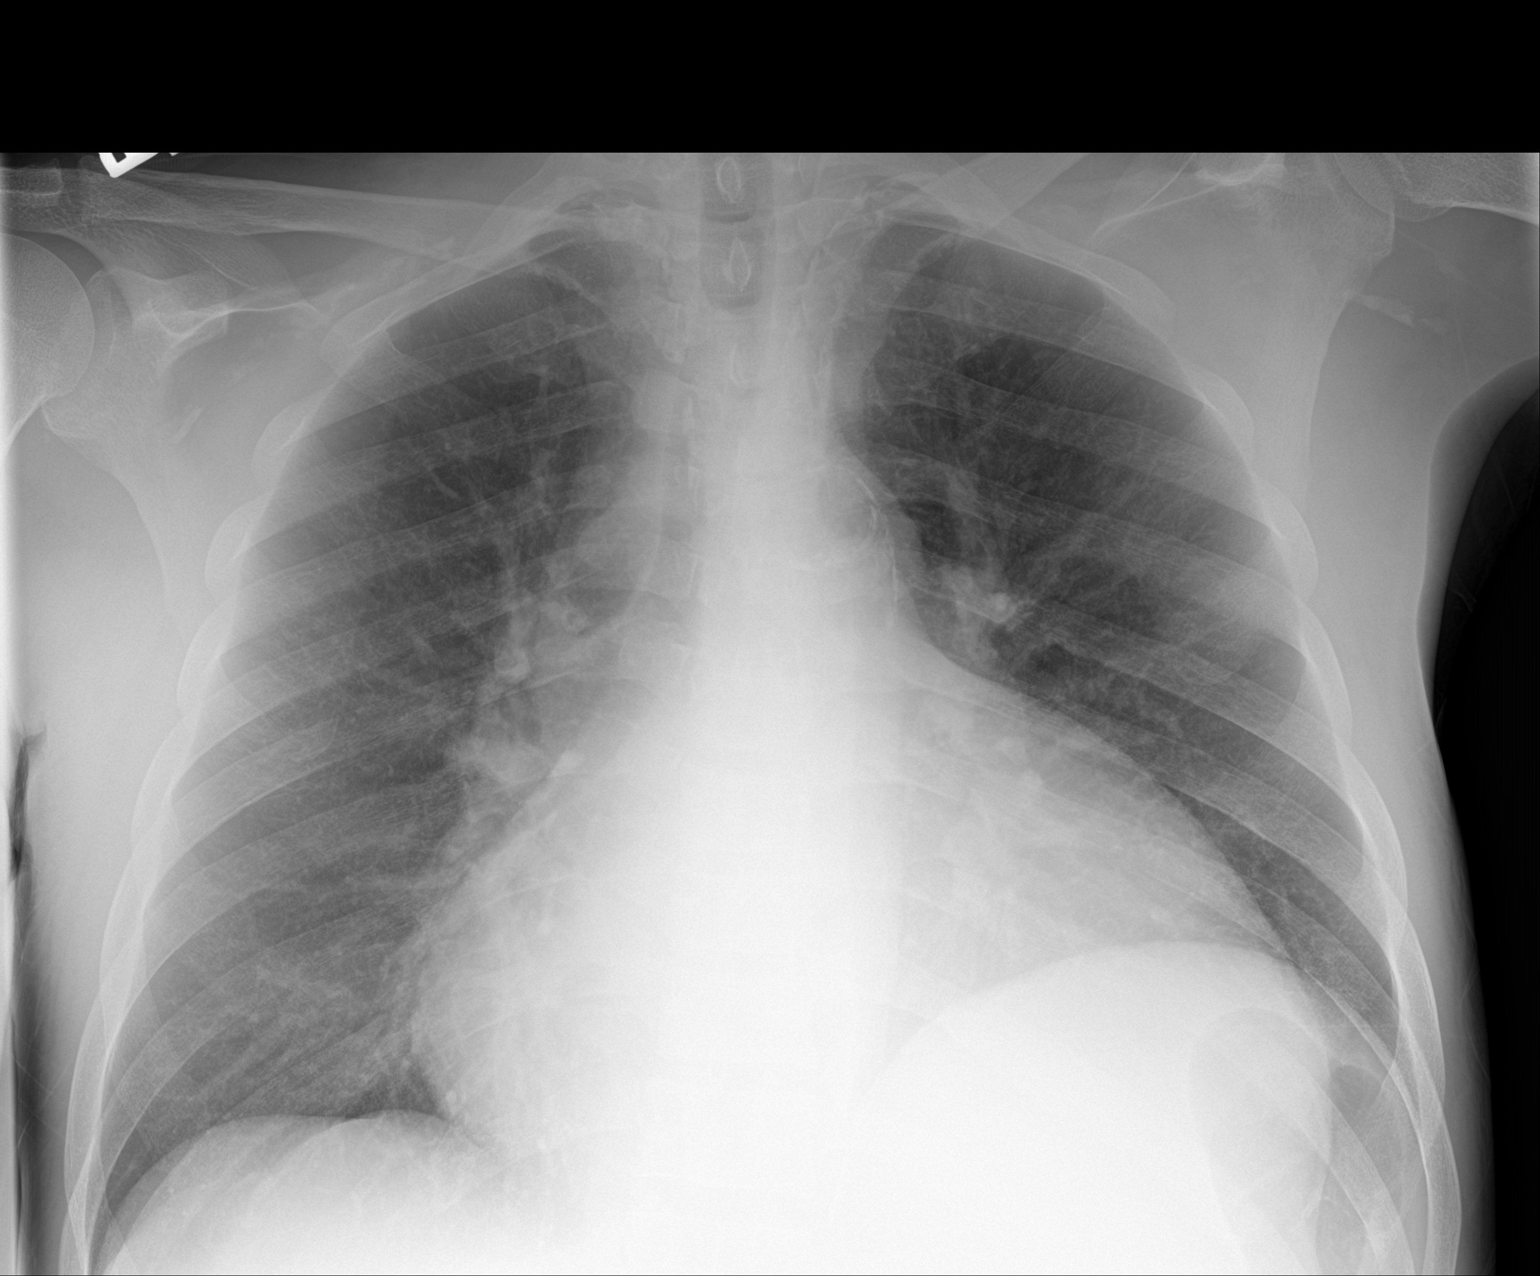

[1 of 1 positions shown; findings below may reference images not displayed]

FINDINGS: Stable severe cardiomegaly. Pulmonary vascular congestion. No focal
consolidation. No pleural effusion or pneumothorax. Aortic
atherosclerosis with calcification. Bones are unremarkable.
IMPRESSION: Stable severe cardiomegaly.  Pulmonary vascular congestion.

By: Savad Avinash M.D.

## 2018-11-03 DIAGNOSIS — N186 End stage renal disease: Secondary | ICD-10-CM | POA: Diagnosis not present

## 2018-11-03 DIAGNOSIS — D631 Anemia in chronic kidney disease: Secondary | ICD-10-CM | POA: Diagnosis not present

## 2018-11-03 DIAGNOSIS — N2581 Secondary hyperparathyroidism of renal origin: Secondary | ICD-10-CM | POA: Diagnosis not present

## 2018-11-03 DIAGNOSIS — D509 Iron deficiency anemia, unspecified: Secondary | ICD-10-CM | POA: Diagnosis not present

## 2018-11-03 DIAGNOSIS — Z23 Encounter for immunization: Secondary | ICD-10-CM | POA: Diagnosis not present

## 2018-11-06 DIAGNOSIS — N186 End stage renal disease: Secondary | ICD-10-CM | POA: Diagnosis not present

## 2018-11-06 DIAGNOSIS — Z23 Encounter for immunization: Secondary | ICD-10-CM | POA: Diagnosis not present

## 2018-11-06 DIAGNOSIS — N2581 Secondary hyperparathyroidism of renal origin: Secondary | ICD-10-CM | POA: Diagnosis not present

## 2018-11-06 DIAGNOSIS — D509 Iron deficiency anemia, unspecified: Secondary | ICD-10-CM | POA: Diagnosis not present

## 2018-11-06 DIAGNOSIS — D631 Anemia in chronic kidney disease: Secondary | ICD-10-CM | POA: Diagnosis not present

## 2018-11-08 DIAGNOSIS — D509 Iron deficiency anemia, unspecified: Secondary | ICD-10-CM | POA: Diagnosis not present

## 2018-11-08 DIAGNOSIS — N2581 Secondary hyperparathyroidism of renal origin: Secondary | ICD-10-CM | POA: Diagnosis not present

## 2018-11-08 DIAGNOSIS — N186 End stage renal disease: Secondary | ICD-10-CM | POA: Diagnosis not present

## 2018-11-08 DIAGNOSIS — D631 Anemia in chronic kidney disease: Secondary | ICD-10-CM | POA: Diagnosis not present

## 2018-11-08 DIAGNOSIS — Z23 Encounter for immunization: Secondary | ICD-10-CM | POA: Diagnosis not present

## 2018-11-10 DIAGNOSIS — D631 Anemia in chronic kidney disease: Secondary | ICD-10-CM | POA: Diagnosis not present

## 2018-11-10 DIAGNOSIS — I120 Hypertensive chronic kidney disease with stage 5 chronic kidney disease or end stage renal disease: Secondary | ICD-10-CM | POA: Diagnosis not present

## 2018-11-10 DIAGNOSIS — Z992 Dependence on renal dialysis: Secondary | ICD-10-CM | POA: Diagnosis not present

## 2018-11-10 DIAGNOSIS — N2581 Secondary hyperparathyroidism of renal origin: Secondary | ICD-10-CM | POA: Diagnosis not present

## 2018-11-10 DIAGNOSIS — D509 Iron deficiency anemia, unspecified: Secondary | ICD-10-CM | POA: Diagnosis not present

## 2018-11-10 DIAGNOSIS — N186 End stage renal disease: Secondary | ICD-10-CM | POA: Diagnosis not present

## 2018-11-13 DIAGNOSIS — D509 Iron deficiency anemia, unspecified: Secondary | ICD-10-CM | POA: Diagnosis not present

## 2018-11-13 DIAGNOSIS — D631 Anemia in chronic kidney disease: Secondary | ICD-10-CM | POA: Diagnosis not present

## 2018-11-13 DIAGNOSIS — N2581 Secondary hyperparathyroidism of renal origin: Secondary | ICD-10-CM | POA: Diagnosis not present

## 2018-11-13 DIAGNOSIS — N186 End stage renal disease: Secondary | ICD-10-CM | POA: Diagnosis not present

## 2018-11-15 DIAGNOSIS — D631 Anemia in chronic kidney disease: Secondary | ICD-10-CM | POA: Diagnosis not present

## 2018-11-15 DIAGNOSIS — N186 End stage renal disease: Secondary | ICD-10-CM | POA: Diagnosis not present

## 2018-11-15 DIAGNOSIS — D509 Iron deficiency anemia, unspecified: Secondary | ICD-10-CM | POA: Diagnosis not present

## 2018-11-15 DIAGNOSIS — N2581 Secondary hyperparathyroidism of renal origin: Secondary | ICD-10-CM | POA: Diagnosis not present

## 2018-11-17 DIAGNOSIS — N186 End stage renal disease: Secondary | ICD-10-CM | POA: Diagnosis not present

## 2018-11-17 DIAGNOSIS — N2581 Secondary hyperparathyroidism of renal origin: Secondary | ICD-10-CM | POA: Diagnosis not present

## 2018-11-17 DIAGNOSIS — D509 Iron deficiency anemia, unspecified: Secondary | ICD-10-CM | POA: Diagnosis not present

## 2018-11-17 DIAGNOSIS — D631 Anemia in chronic kidney disease: Secondary | ICD-10-CM | POA: Diagnosis not present

## 2018-11-20 DIAGNOSIS — D631 Anemia in chronic kidney disease: Secondary | ICD-10-CM | POA: Diagnosis not present

## 2018-11-20 DIAGNOSIS — D509 Iron deficiency anemia, unspecified: Secondary | ICD-10-CM | POA: Diagnosis not present

## 2018-11-20 DIAGNOSIS — N186 End stage renal disease: Secondary | ICD-10-CM | POA: Diagnosis not present

## 2018-11-20 DIAGNOSIS — N2581 Secondary hyperparathyroidism of renal origin: Secondary | ICD-10-CM | POA: Diagnosis not present

## 2018-11-21 IMAGING — DX DG CHEST 2V
2 series · 2 of 2 positions shown · non-contrast
Comparison: May 02, 2017

CLINICAL DATA: Shortness of breath and hypertension

EXAM:
CHEST  2 VIEW

[chest lat (1 of 2)]
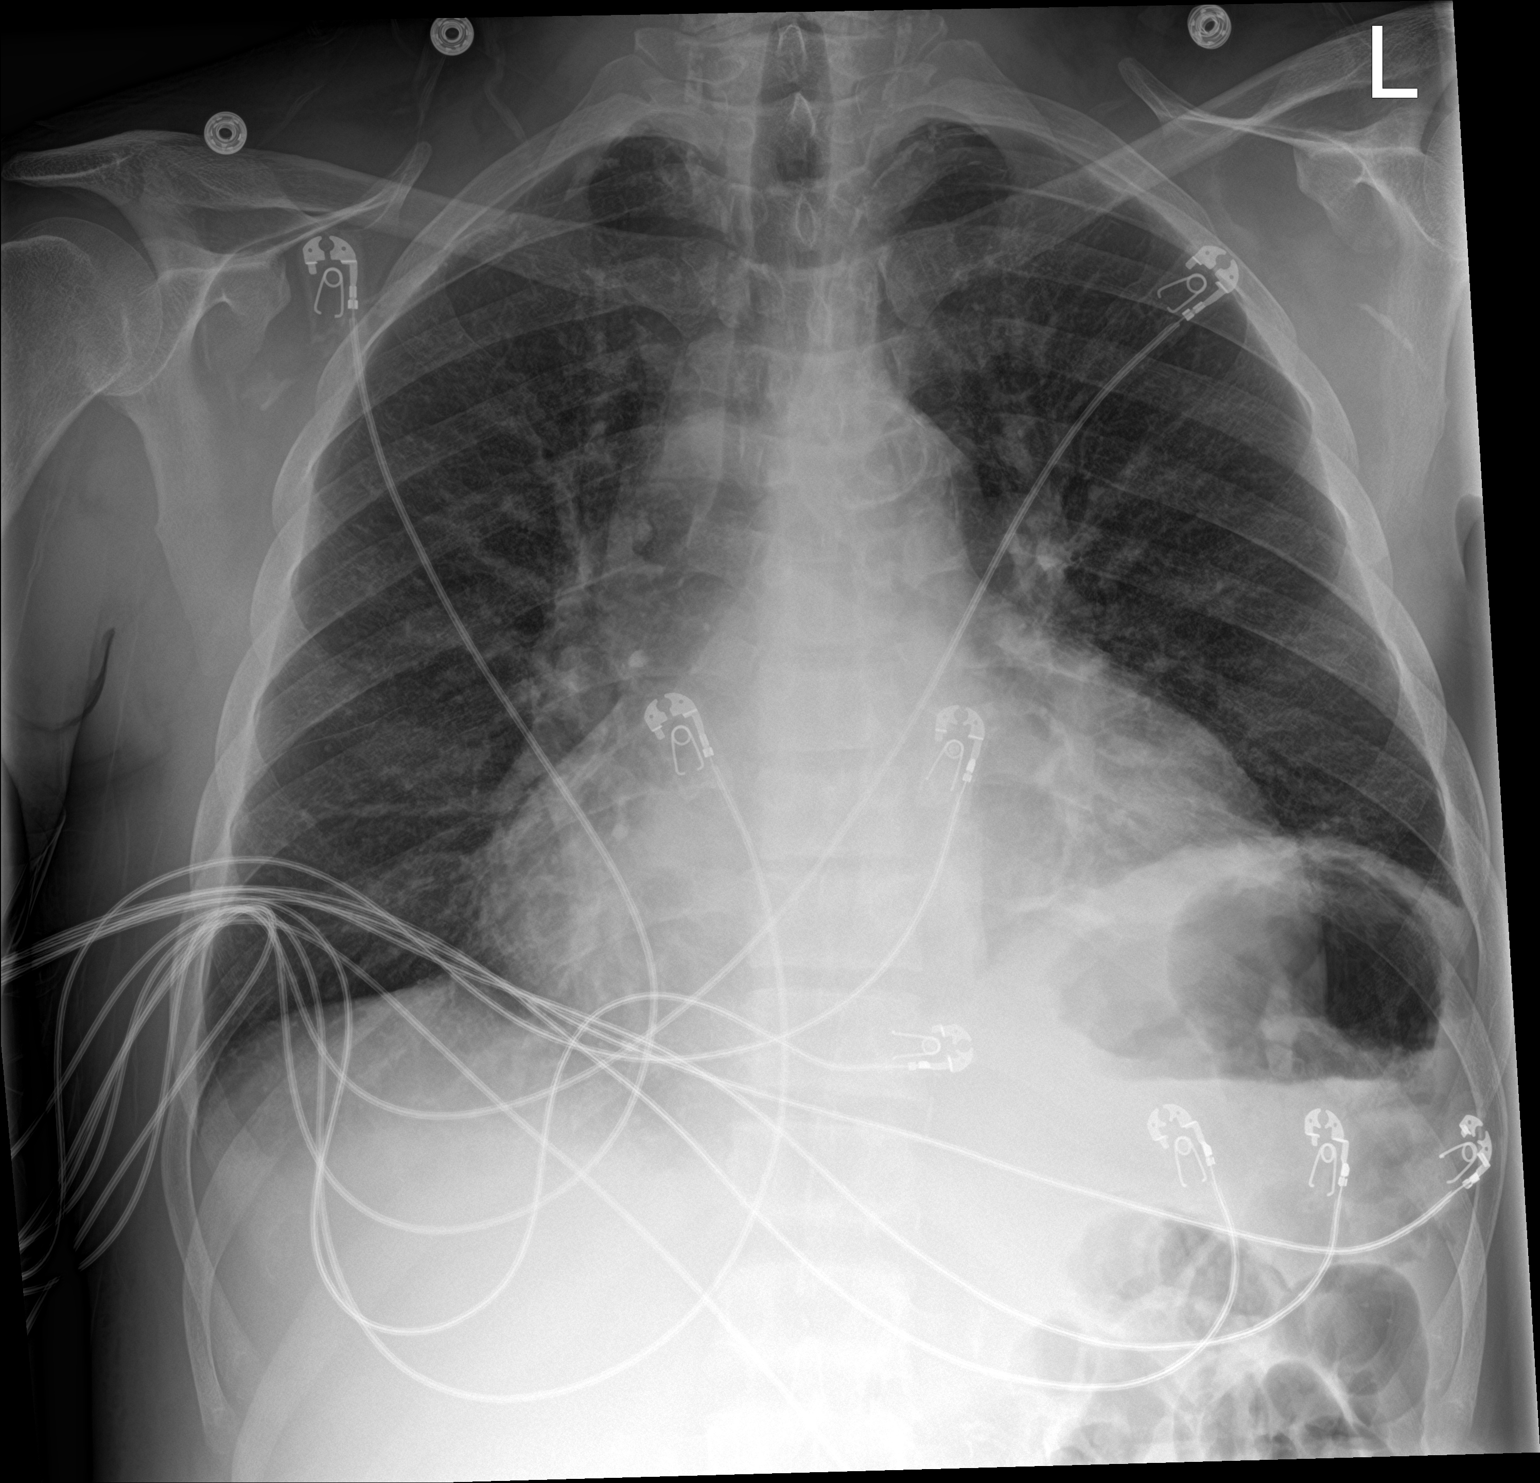

[chest lat (2 of 2)]
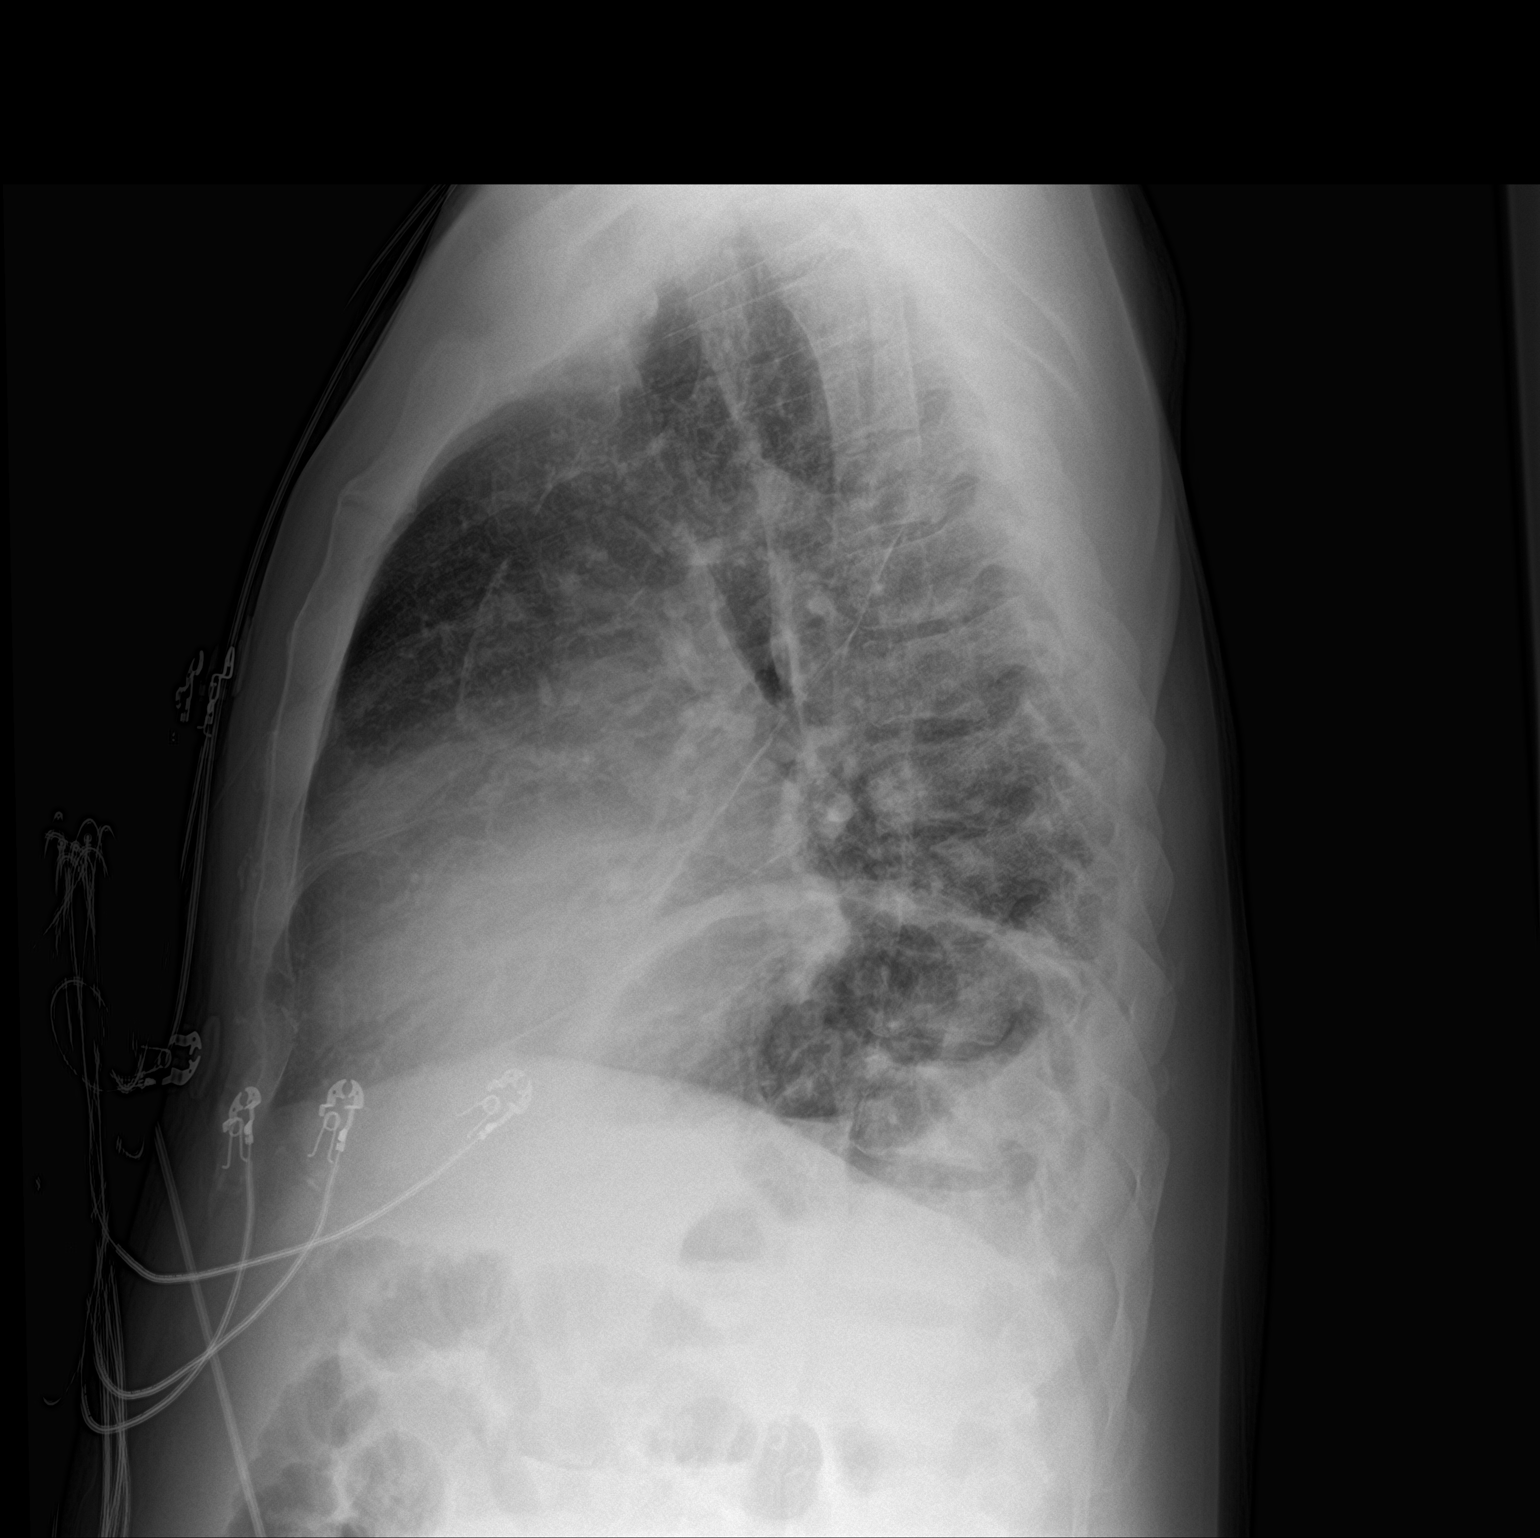

[2 of 2 positions shown; findings below may reference images not displayed]

FINDINGS: There is mild elevation of the left hemidiaphragm, stable. There is
mild left base atelectasis. Lungs elsewhere clear. There is
cardiomegaly with pulmonary venous hypertension. There is aortic
atherosclerosis. No evident adenopathy. No bone lesions.
IMPRESSION: Left base atelectasis. No edema or consolidation. Stable
cardiomegaly. There is aortic atherosclerosis.

Aortic Atherosclerosis ([id number]-[id number].O).

## 2018-11-22 DIAGNOSIS — N2581 Secondary hyperparathyroidism of renal origin: Secondary | ICD-10-CM | POA: Diagnosis not present

## 2018-11-22 DIAGNOSIS — D631 Anemia in chronic kidney disease: Secondary | ICD-10-CM | POA: Diagnosis not present

## 2018-11-22 DIAGNOSIS — D509 Iron deficiency anemia, unspecified: Secondary | ICD-10-CM | POA: Diagnosis not present

## 2018-11-22 DIAGNOSIS — N186 End stage renal disease: Secondary | ICD-10-CM | POA: Diagnosis not present

## 2018-11-24 DIAGNOSIS — N2581 Secondary hyperparathyroidism of renal origin: Secondary | ICD-10-CM | POA: Diagnosis not present

## 2018-11-24 DIAGNOSIS — D509 Iron deficiency anemia, unspecified: Secondary | ICD-10-CM | POA: Diagnosis not present

## 2018-11-24 DIAGNOSIS — D631 Anemia in chronic kidney disease: Secondary | ICD-10-CM | POA: Diagnosis not present

## 2018-11-24 DIAGNOSIS — N186 End stage renal disease: Secondary | ICD-10-CM | POA: Diagnosis not present

## 2018-11-27 DIAGNOSIS — D631 Anemia in chronic kidney disease: Secondary | ICD-10-CM | POA: Diagnosis not present

## 2018-11-27 DIAGNOSIS — D509 Iron deficiency anemia, unspecified: Secondary | ICD-10-CM | POA: Diagnosis not present

## 2018-11-27 DIAGNOSIS — N2581 Secondary hyperparathyroidism of renal origin: Secondary | ICD-10-CM | POA: Diagnosis not present

## 2018-11-27 DIAGNOSIS — N186 End stage renal disease: Secondary | ICD-10-CM | POA: Diagnosis not present

## 2018-11-28 IMAGING — DX DG CHEST 1V PORT
1 series · 1 of 1 positions shown · non-contrast
Comparison: 05/22/2017

CLINICAL DATA: Shortness of Breath

EXAM:
PORTABLE CHEST 1 VIEW

[chest ap]
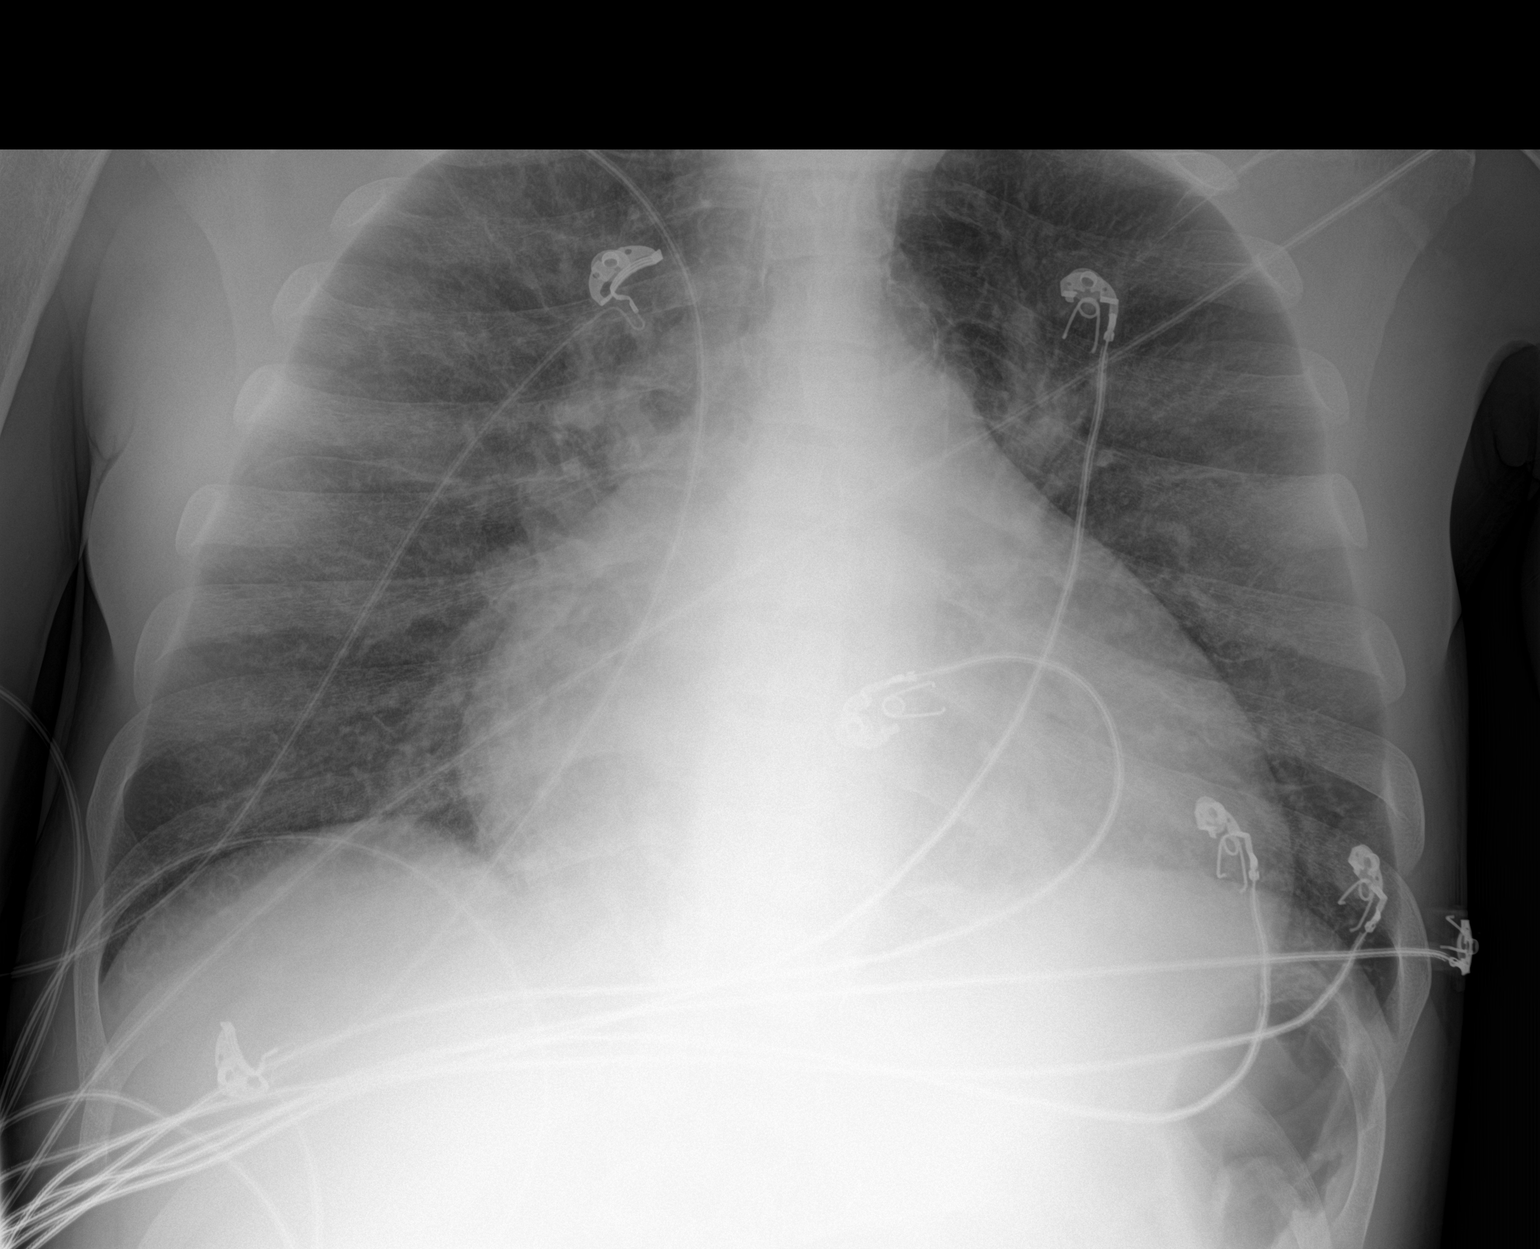

[1 of 1 positions shown; findings below may reference images not displayed]

FINDINGS: Cardiac shadow remains enlarged. Aortic calcifications are again
seen. The lungs are well aerated bilaterally with mild interstitial
changes consistent with early edema. No focal infiltrate or sizable
effusion is noted.
IMPRESSION: Mild interstitial edema.

## 2018-11-29 DIAGNOSIS — D509 Iron deficiency anemia, unspecified: Secondary | ICD-10-CM | POA: Diagnosis not present

## 2018-11-29 DIAGNOSIS — N2581 Secondary hyperparathyroidism of renal origin: Secondary | ICD-10-CM | POA: Diagnosis not present

## 2018-11-29 DIAGNOSIS — N186 End stage renal disease: Secondary | ICD-10-CM | POA: Diagnosis not present

## 2018-11-29 DIAGNOSIS — D631 Anemia in chronic kidney disease: Secondary | ICD-10-CM | POA: Diagnosis not present

## 2018-11-29 IMAGING — DX DG CHEST 1V PORT
2 series · 2 of 2 positions shown · non-contrast
Comparison: Portable exam 8880 hours compared to 05/29/2017

CLINICAL DATA: Central line placement

EXAM:
PORTABLE CHEST 1 VIEW

[chest ap (1 of 2)]
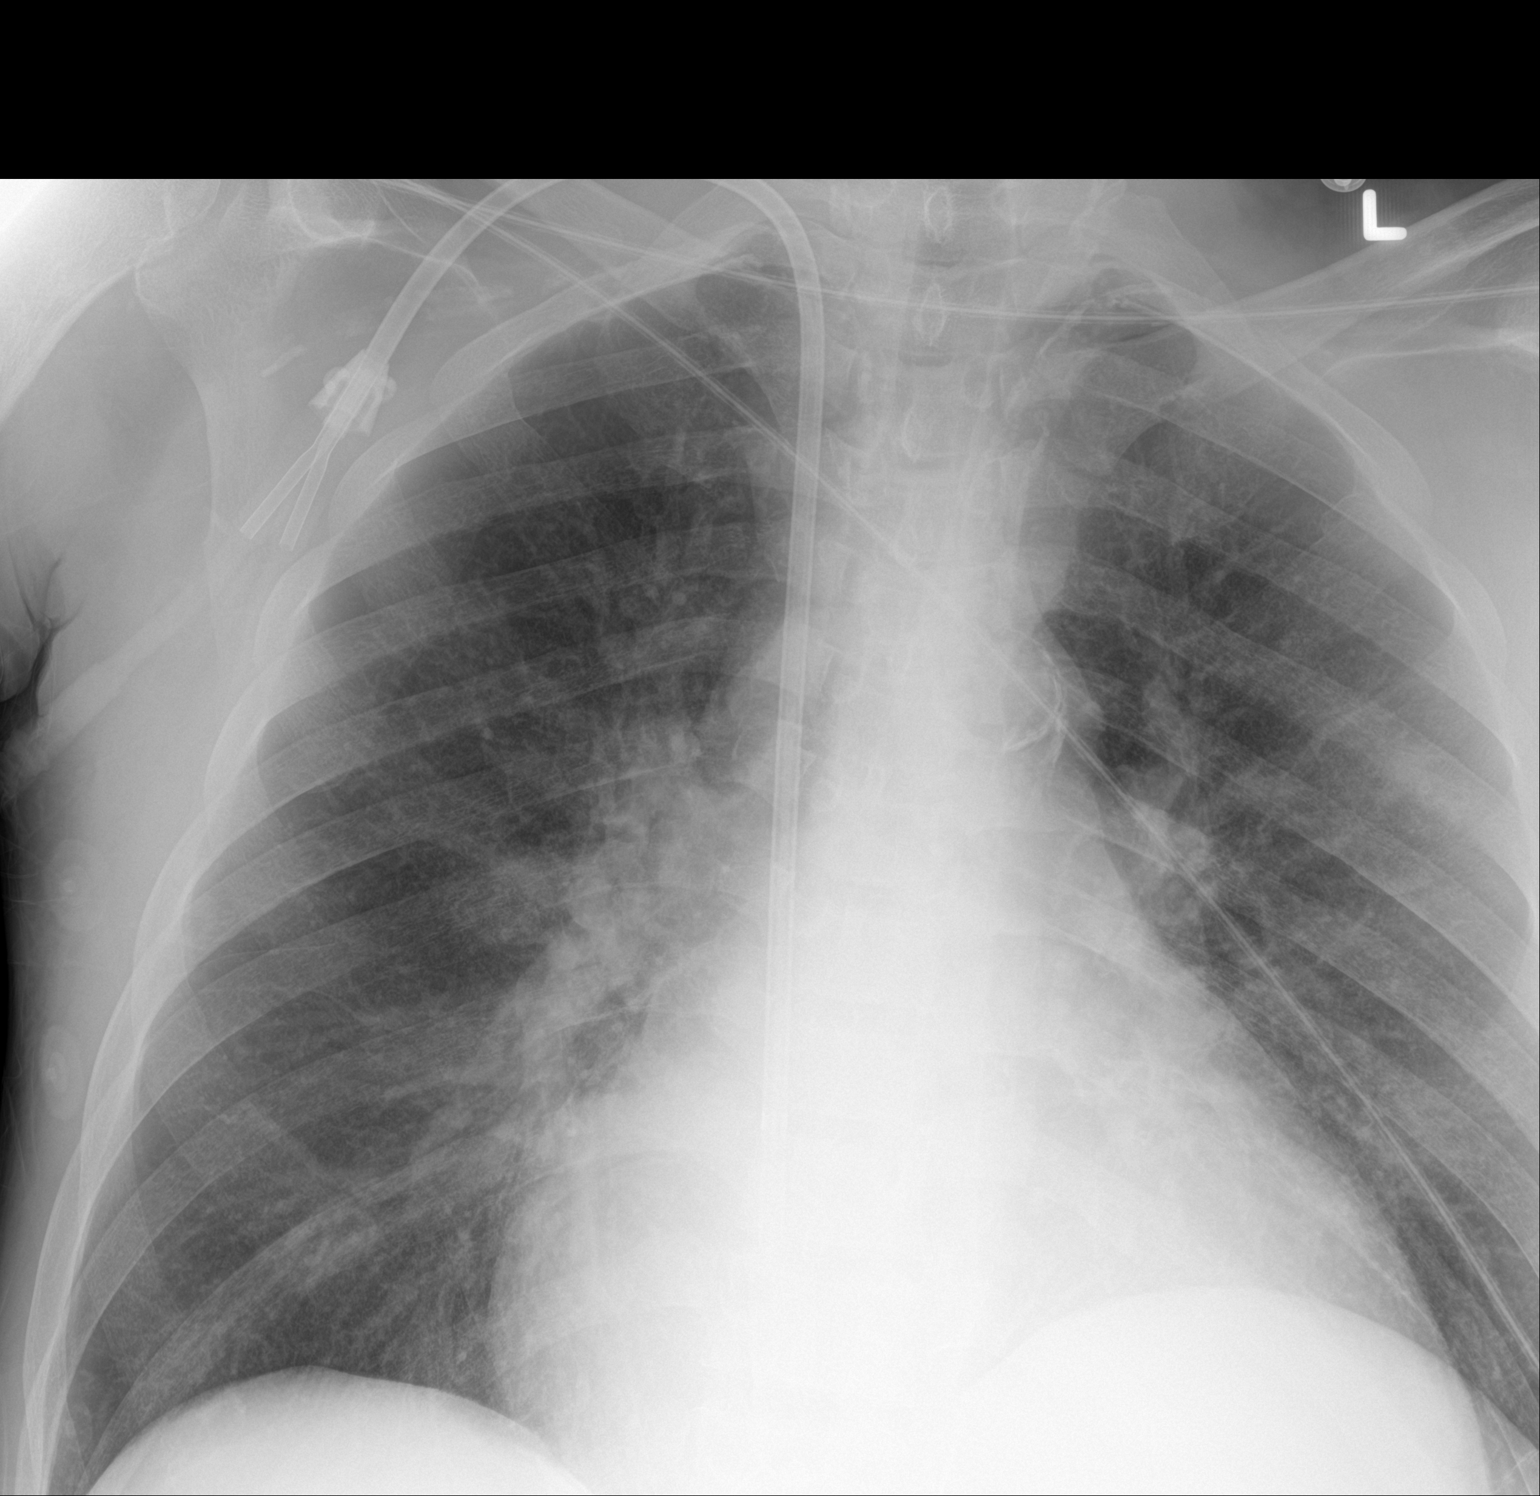

[chest ap (2 of 2)]
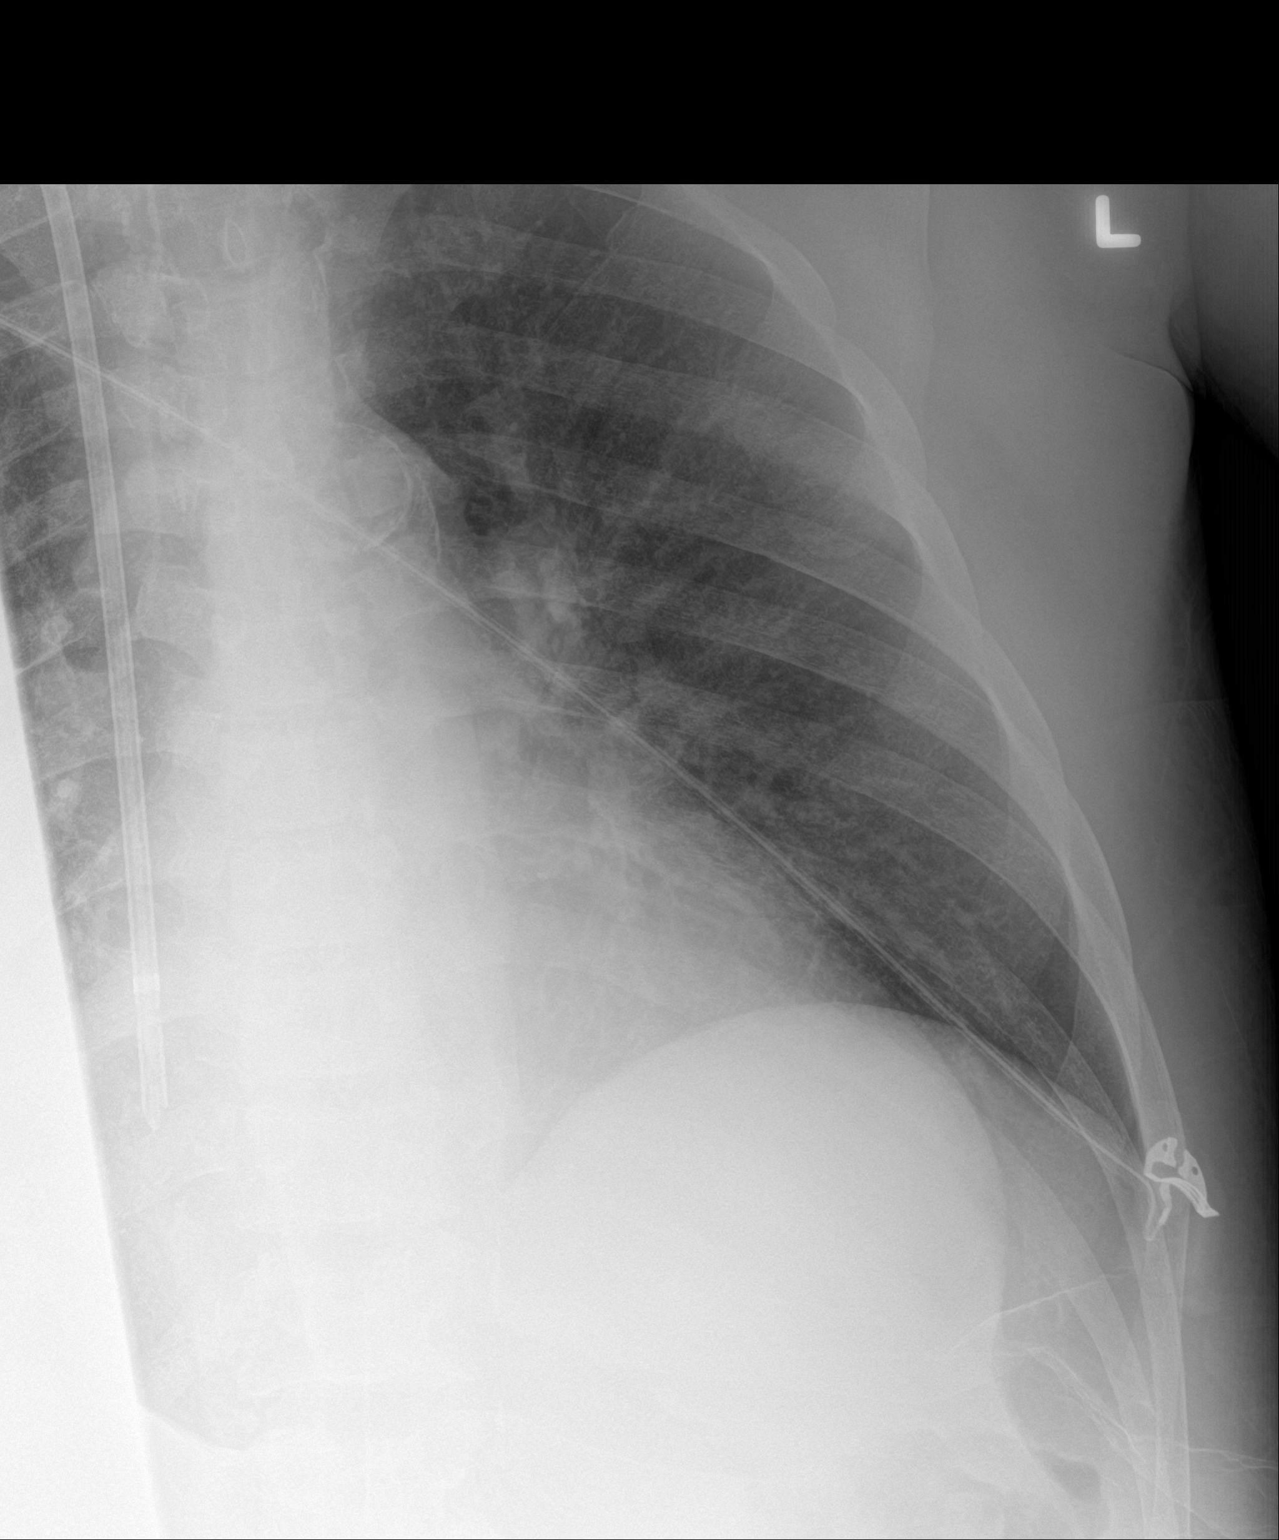

[2 of 2 positions shown; findings below may reference images not displayed]

FINDINGS: Large bore dual-lumen RIGHT jugular central venous catheter with tip
projecting over RIGHT atrium.

Enlargement of cardiac silhouette with pulmonary vascular
congestion.

Atherosclerotic calcification aorta.

Minimal perihilar infiltrates likely pulmonary edema.

No gross pleural effusion or pneumothorax.

Osseous structures unremarkable.
IMPRESSION: No pneumothorax following RIGHT jugular line placement.

Enlargement of cardiac silhouette with minimal pulmonary edema.

## 2018-12-01 DIAGNOSIS — D509 Iron deficiency anemia, unspecified: Secondary | ICD-10-CM | POA: Diagnosis not present

## 2018-12-01 DIAGNOSIS — N2581 Secondary hyperparathyroidism of renal origin: Secondary | ICD-10-CM | POA: Diagnosis not present

## 2018-12-01 DIAGNOSIS — D631 Anemia in chronic kidney disease: Secondary | ICD-10-CM | POA: Diagnosis not present

## 2018-12-01 DIAGNOSIS — N186 End stage renal disease: Secondary | ICD-10-CM | POA: Diagnosis not present

## 2018-12-04 DIAGNOSIS — D631 Anemia in chronic kidney disease: Secondary | ICD-10-CM | POA: Diagnosis not present

## 2018-12-04 DIAGNOSIS — N2581 Secondary hyperparathyroidism of renal origin: Secondary | ICD-10-CM | POA: Diagnosis not present

## 2018-12-04 DIAGNOSIS — D509 Iron deficiency anemia, unspecified: Secondary | ICD-10-CM | POA: Diagnosis not present

## 2018-12-04 DIAGNOSIS — N186 End stage renal disease: Secondary | ICD-10-CM | POA: Diagnosis not present

## 2018-12-06 DIAGNOSIS — N2581 Secondary hyperparathyroidism of renal origin: Secondary | ICD-10-CM | POA: Diagnosis not present

## 2018-12-06 DIAGNOSIS — N186 End stage renal disease: Secondary | ICD-10-CM | POA: Diagnosis not present

## 2018-12-06 DIAGNOSIS — D509 Iron deficiency anemia, unspecified: Secondary | ICD-10-CM | POA: Diagnosis not present

## 2018-12-06 DIAGNOSIS — D631 Anemia in chronic kidney disease: Secondary | ICD-10-CM | POA: Diagnosis not present

## 2018-12-08 DIAGNOSIS — D631 Anemia in chronic kidney disease: Secondary | ICD-10-CM | POA: Diagnosis not present

## 2018-12-08 DIAGNOSIS — N186 End stage renal disease: Secondary | ICD-10-CM | POA: Diagnosis not present

## 2018-12-08 DIAGNOSIS — N2581 Secondary hyperparathyroidism of renal origin: Secondary | ICD-10-CM | POA: Diagnosis not present

## 2018-12-08 DIAGNOSIS — D509 Iron deficiency anemia, unspecified: Secondary | ICD-10-CM | POA: Diagnosis not present

## 2018-12-10 ENCOUNTER — Emergency Department (HOSPITAL_COMMUNITY)
Admission: EM | Admit: 2018-12-10 | Discharge: 2018-12-10 | Disposition: A | Discharge status: Home / Self Care | Payer: Medicare Other | Attending: Emergency Medicine | Admitting: Emergency Medicine

## 2018-12-10 ENCOUNTER — Emergency Department (HOSPITAL_COMMUNITY): Payer: Medicare Other

## 2018-12-10 ENCOUNTER — Other Ambulatory Visit: Payer: Self-pay

## 2018-12-10 ENCOUNTER — Encounter (HOSPITAL_COMMUNITY): Payer: Self-pay | Admitting: Emergency Medicine

## 2018-12-10 DIAGNOSIS — K5900 Constipation, unspecified: Secondary | ICD-10-CM | POA: Diagnosis not present

## 2018-12-10 DIAGNOSIS — Z992 Dependence on renal dialysis: Secondary | ICD-10-CM | POA: Diagnosis not present

## 2018-12-10 DIAGNOSIS — Z7982 Long term (current) use of aspirin: Secondary | ICD-10-CM | POA: Diagnosis not present

## 2018-12-10 DIAGNOSIS — Z79899 Other long term (current) drug therapy: Secondary | ICD-10-CM | POA: Diagnosis not present

## 2018-12-10 DIAGNOSIS — N186 End stage renal disease: Secondary | ICD-10-CM | POA: Diagnosis not present

## 2018-12-10 DIAGNOSIS — I120 Hypertensive chronic kidney disease with stage 5 chronic kidney disease or end stage renal disease: Secondary | ICD-10-CM | POA: Diagnosis not present

## 2018-12-10 DIAGNOSIS — F17210 Nicotine dependence, cigarettes, uncomplicated: Secondary | ICD-10-CM | POA: Insufficient documentation

## 2018-12-10 DIAGNOSIS — J9811 Atelectasis: Secondary | ICD-10-CM | POA: Diagnosis not present

## 2018-12-10 DIAGNOSIS — K598 Other specified functional intestinal disorders: Secondary | ICD-10-CM | POA: Diagnosis not present

## 2018-12-10 DIAGNOSIS — J449 Chronic obstructive pulmonary disease, unspecified: Secondary | ICD-10-CM | POA: Diagnosis not present

## 2018-12-10 DIAGNOSIS — R1084 Generalized abdominal pain: Secondary | ICD-10-CM | POA: Insufficient documentation

## 2018-12-10 LAB — COMPREHENSIVE METABOLIC PANEL
ALT: 18 U/L (ref 0–44)
AST: 37 U/L (ref 15–41)
Albumin: 3.7 g/dL (ref 3.5–5.0)
Alkaline Phosphatase: 138 U/L — ABNORMAL HIGH (ref 38–126)
Anion gap: 17 — ABNORMAL HIGH (ref 5–15)
BUN: 24 mg/dL — ABNORMAL HIGH (ref 6–20)
CO2: 31 mmol/L (ref 22–32)
Calcium: 9.4 mg/dL (ref 8.9–10.3)
Chloride: 84 mmol/L — ABNORMAL LOW (ref 98–111)
Creatinine, Ser: 7.96 mg/dL — ABNORMAL HIGH (ref 0.61–1.24)
GFR calc Af Amer: 8 mL/min — ABNORMAL LOW (ref >60)
GFR calc non Af Amer: 7 mL/min — ABNORMAL LOW (ref >60)
Glucose, Bld: 118 mg/dL — ABNORMAL HIGH (ref 70–99)
Potassium: 3.6 mmol/L (ref 3.5–5.1)
Sodium: 132 mmol/L — ABNORMAL LOW (ref 135–145)
Total Bilirubin: 1.3 mg/dL — ABNORMAL HIGH (ref 0.3–1.2)
Total Protein: 7.6 g/dL (ref 6.5–8.1)

## 2018-12-10 LAB — CBC WITH DIFFERENTIAL/PLATELET
Abs Immature Granulocytes: 0.06 10*3/uL (ref 0.00–0.07)
Basophils Absolute: 0.1 10*3/uL (ref 0.0–0.1)
Basophils Relative: 1 %
Eosinophils Absolute: 0.1 10*3/uL (ref 0.0–0.5)
Eosinophils Relative: 1 %
HCT: 30.4 % — ABNORMAL LOW (ref 39.0–52.0)
Hemoglobin: 10.6 g/dL — ABNORMAL LOW (ref 13.0–17.0)
Immature Granulocytes: 1 %
Lymphocytes Relative: 17 %
Lymphs Abs: 1.6 10*3/uL (ref 0.7–4.0)
MCH: 27.7 pg (ref 26.0–34.0)
MCHC: 34.9 g/dL (ref 30.0–36.0)
MCV: 79.6 fL — ABNORMAL LOW (ref 80.0–100.0)
Monocytes Absolute: 0.9 10*3/uL (ref 0.1–1.0)
Monocytes Relative: 9 %
Neutro Abs: 6.5 10*3/uL (ref 1.7–7.7)
Neutrophils Relative %: 71 %
Platelets: 151 10*3/uL (ref 150–400)
RBC: 3.82 MIL/uL — ABNORMAL LOW (ref 4.22–5.81)
RDW: 15.4 % (ref 11.5–15.5)
WBC: 9.3 10*3/uL (ref 4.0–10.5)
nRBC: 0 % (ref 0.0–0.2)

## 2018-12-10 LAB — LIPASE, BLOOD: Lipase: 28 U/L (ref 11–51)

## 2018-12-10 MED ORDER — LACTULOSE 10 GM/15ML PO SOLN
20.0000 g | Freq: Once | ORAL | Status: AC
  Administered 2018-12-10: 20 g via ORAL
  Filled 2018-12-10: qty 30

## 2018-12-10 MED ORDER — LACTULOSE 10 GM/15ML PO SOLN: 30.0000 g | Freq: Two times a day (BID) | ORAL | 0 refills | Status: DC

## 2018-12-10 MED ORDER — SODIUM CHLORIDE 0.9 % IV BOLUS
1000.0000 mL | Freq: Once | INTRAVENOUS | Status: AC
  Administered 2018-12-10: 1000 mL via INTRAVENOUS

## 2018-12-10 NOTE — Discharge Instructions (Addendum)
You were seen in the ED today for constipation; your labwork was unremarkable at this time; you were given Lactulose in the ED as well as prescribed; please pick up medication and take as prescribed. Please follow up with your PCP. Return to the ED immediately if you begin having worsening pain, vomiting, stop passing gas, or if you develop shortness of breath.

## 2018-12-10 NOTE — ED Triage Notes (Signed)
Unable to collect urine sample ,because he is Dialysis Pt and  Does not produce urine.

## 2018-12-10 NOTE — ED Provider Notes (Signed)
[facility] EMERGENCY DEPARTMENT Provider Note   CSN: [id number] Arrival date & time: 12/10/18  0254    History   Chief Complaint Chief Complaint  Patient presents with  . Constipation    HPI [name] is a 58 y.o. male with PMHx HTN, ESRD on dialysis MWF, cirrhosis who presents to the ED complaining of constipation x 1 week. He reports his insurance stopped covering his Lactulose so he has not been taking anything to help him go; he called his PCP this past week and the prescribed "something else" that he only started taking yesterday but he reports no improvement. Also complaining of diffuse pain all over his abdomen as well as nausea. No vomiting. Pt still passing gas. Pt has had multiple paracentesis procedures done in the past; last about September 2019. States this feels different than his typical ascites. Denies fever, chills, chest pain, shortness of breath, melena, hematochezia, or any other associated symptoms.        Past Medical History:  Diagnosis Date  . Anemia   . Anxiety   . Arthritis    left shoulder  . Atherosclerosis of aorta ([place])   . Cardiomegaly   . Chest pain    DATE UNKNOWN, C/O PERIODICALLY  . Cocaine abuse ([place])   . COPD exacerbation ([place]) 08/17/2016  . Coronary artery disease    stent 02/22/17  . ESRD (end stage renal disease) on dialysis ([place])    "[name]; MWF" (07/04/2017)  . GERD (gastroesophageal reflux disease)    DATE UNKNOWN  . Hemorrhoids   . Hepatitis B, chronic ([place])   . Hepatitis C   . History of kidney stones   . Hyperkalemia   . Hypertension   . Kidney failure   . Metabolic bone disease    Patient denies  . Mitral stenosis   . Myocardial infarction ([place])   . Pneumonia   . Pulmonary edema   . Solitary rectal ulcer syndrome 07/2017   at flex sig for rectal bleeding  . Tubular adenoma of colon     Patient Active Problem List   Diagnosis Date Noted  . Sepsis ([place]) 09/12/2018  .  Atherosclerosis of native coronary artery of native heart without angina pectoris 03/11/2018  . Benign neoplasm of cecum   . Benign neoplasm of ascending colon   . Benign neoplasm of descending colon   . Benign neoplasm of rectum   . Paroxysmal atrial fibrillation ([place]) 01/23/2018  . Hx of colonic polyps 01/20/2018  . ESRD (end stage renal disease) ([place]) 11/21/2017  . GERD (gastroesophageal reflux disease) 11/16/2017  . Liver cirrhosis ([place]) 11/15/2017  . DNR (do not resuscitate)   . Palliative care by specialist   . Hyponatremia 11/04/2017  . SBP (spontaneous bacterial peritonitis) ([place]) 10/30/2017  . Liver disease, chronic 10/30/2017  . SOB (shortness of breath)   . Abdominal pain 10/28/2017  . Upper airway cough syndrome with flattening on f/v loop 10/13/17 c/w vcd 10/17/2017  . Elevated diaphragm 10/13/2017  . Ileus ([place]) 09/29/2017  . Malnutrition of moderate degree 09/29/2017  . Sinus congestion 09/03/2017  . Symptomatic anemia 09/02/2017  . Other cirrhosis of liver ([place]) 09/02/2017  . Left bundle branch block 09/02/2017  . Mitral stenosis 09/02/2017  . Hematochezia 07/15/2017  . Wide-complex tachycardia ([place])   . Endotracheally intubated   . ESRD on dialysis ([place]) 07/04/2017  . Acute respiratory failure with hypoxia ([place]) 06/18/2017  . CKD (chronic kidney disease) stage V requiring chronic dialysis (  [place]) 06/18/2017  . History of Cocaine abuse ([place]) 06/18/2017  . Hypertension 06/18/2017  . Infection of AV graft for dialysis ([place]) 06/18/2017  . Anxiety 06/18/2017  . Anemia due to chronic kidney disease 06/18/2017  . Atrial flutter with rapid ventricular response ([place]) 06/18/2017  . Personality disorder ([place]) 06/13/2017  . Cellulitis 06/12/2017  . Adjustment disorder with mixed anxiety and depressed mood 06/10/2017  . Suicidal ideation 06/10/2017  . Arm wound, left, sequela 06/10/2017  . Dyspnea on exertion 05/29/2017  . Tachycardia 05/29/2017  . Hyperkalemia 05/22/2017   . Acute metabolic encephalopathy   . Anemia 04/23/2017  . Ascites 04/23/2017  . COPD (chronic obstructive pulmonary disease) ([place]) 04/23/2017  . Acute on chronic respiratory failure with hypoxia ([place]) 03/25/2017  . Arrhythmia 03/25/2017  . COPD GOLD 0 with flattening on inps f/v  09/27/2016  . Essential hypertension 09/27/2016  . Fluid overload 08/30/2016  . COPD exacerbation ([place]) 08/17/2016  . Hypertensive urgency 08/17/2016  . Respiratory failure ([place]) 08/17/2016  . Problem with dialysis access ([place]) 07/23/2016  . Chronic hepatitis B ([place]) 03/05/2014  . Chronic hepatitis C without hepatic coma ([place]) 03/05/2014  . Internal hemorrhoids with bleeding, swelling and itching 03/05/2014  . Thrombocytopenia ([place]) 03/05/2014  . Chest pain 02/27/2014  . Alcohol abuse 04/14/2009  . Cigarette smoker 04/14/2009  . GANGLION CYST 04/14/2009    Past Surgical History:  Procedure Laterality Date  . A/V FISTULAGRAM Left 05/26/2017   Procedure: A/V FISTULAGRAM;  Surgeon: Conrad Leupp, MD;  Location: [facility];  Service: Cardiovascular;  Laterality: Left;  . A/V FISTULAGRAM Right 11/18/2017   Procedure: A/V FISTULAGRAM - Right Arm;  Surgeon: Elam Dutch, MD;  Location: [facility];  Service: Cardiovascular;  Laterality: Right;  . APPLICATION OF WOUND VAC Left 06/14/2017   Procedure: APPLICATION OF WOUND VAC;  Surgeon: Katha Cabal, MD;  Location: [facility] ORS;  Service: Vascular;  Laterality: Left;  . AV FISTULA PLACEMENT  2012   BELIEVED WAS PLACED IN JUNE  . AV FISTULA PLACEMENT Right 08/09/2017   Procedure: Creation Right arm ARTERIOVENOUS BRACHIOCEPOHALIC FISTULA;  Surgeon: Elam Dutch, MD;  Location: [facility] OR;  Service: Vascular;  Laterality: Right;  . AV FISTULA PLACEMENT Right 11/22/2017   Procedure: INSERTION OF ARTERIOVENOUS (AV) GORE-TEX GRAFT RIGHT UPPER ARM;  Surgeon: Elam Dutch, MD;  Location: [facility];  Service: Vascular;  Laterality: Right;  . BIOPSY   01/25/2018   Procedure: BIOPSY;  Surgeon: Jerene Bears, MD;  Location: [facility] ENDOSCOPY;  Service: Gastroenterology;;  . COLONOSCOPY    . COLONOSCOPY WITH PROPOFOL N/A 01/25/2018   Procedure: COLONOSCOPY WITH PROPOFOL;  Surgeon: Jerene Bears, MD;  Location: [facility];  Service: Gastroenterology;  Laterality: N/A;  . CORONARY STENT INTERVENTION N/A 02/22/2017   Procedure: CORONARY STENT INTERVENTION;  Surgeon: Nigel Mormon, MD;  Location: [facility];  Service: Cardiovascular;  Laterality: N/A;  . ESOPHAGOGASTRODUODENOSCOPY (EGD) WITH PROPOFOL N/A 01/25/2018   Procedure: ESOPHAGOGASTRODUODENOSCOPY (EGD) WITH PROPOFOL;  Surgeon: Jerene Bears, MD;  Location: [facility];  Service: Gastroenterology;  Laterality: N/A;  . FLEXIBLE SIGMOIDOSCOPY N/A 07/15/2017   Procedure: FLEXIBLE SIGMOIDOSCOPY;  Surgeon: Carol Ada, MD;  Location: [facility];  Service: Endoscopy;  Laterality: N/A;  . HEMORRHOID BANDING    . I&D EXTREMITY Left 06/01/2017   Procedure: IRRIGATION AND DEBRIDEMENT LEFT ARM HEMATOMA WITH LIGATION OF LEFT ARM AV FISTULA;  Surgeon: Elam Dutch, MD;  Location: [facility];  Service:  Vascular;  Laterality: Left;  . I&D EXTREMITY Left 06/14/2017   Procedure: IRRIGATION AND DEBRIDEMENT EXTREMITY;  Surgeon: Katha Cabal, MD;  Location: [facility] ORS;  Service: Vascular;  Laterality: Left;  . INSERTION OF DIALYSIS CATHETER  05/30/2017  . INSERTION OF DIALYSIS CATHETER N/A 05/30/2017   Procedure: INSERTION OF DIALYSIS CATHETER;  Surgeon: Elam Dutch, MD;  Location: [facility];  Service: Vascular;  Laterality: N/A;  . IR PARACENTESIS  08/30/2017  . IR PARACENTESIS  09/29/2017  . IR PARACENTESIS  10/28/2017  . IR PARACENTESIS  11/09/2017  . IR PARACENTESIS  11/16/2017  . IR PARACENTESIS  11/28/2017  . IR PARACENTESIS  12/01/2017  . IR PARACENTESIS  12/06/2017  . IR PARACENTESIS  01/03/2018  . IR PARACENTESIS  01/23/2018  . IR PARACENTESIS  02/07/2018  . IR PARACENTESIS  02/21/2018  . IR  PARACENTESIS  03/06/2018  . IR PARACENTESIS  03/17/2018  . IR PARACENTESIS  04/04/2018  . IR RADIOLOGIST EVAL & MGMT  02/14/2018  . LEFT HEART CATH AND CORONARY ANGIOGRAPHY N/A 02/22/2017   Procedure: LEFT HEART CATH AND CORONARY ANGIOGRAPHY;  Surgeon: Nigel Mormon, MD;  Location: [facility];  Service: Cardiovascular;  Laterality: N/A;  . LIGATION OF ARTERIOVENOUS  FISTULA Left 02/09/174   Procedure: Plication of Left Arm Arteriovenous Fistula;  Surgeon: Elam Dutch, MD;  Location: [facility];  Service: Vascular;  Laterality: Left;  . POLYPECTOMY    . POLYPECTOMY  01/25/2018   Procedure: POLYPECTOMY;  Surgeon: Jerene Bears, MD;  Location: [facility];  Service: Gastroenterology;;  . REVISON OF ARTERIOVENOUS FISTULA Left 07/13/5850   Procedure: PLICATION OF DISTAL ANEURYSMAL SEGEMENT OF LEFT UPPER ARM ARTERIOVENOUS FISTULA;  Surgeon: Elam Dutch, MD;  Location: [facility];  Service: Vascular;  Laterality: Left;  . REVISON OF ARTERIOVENOUS FISTULA Left 7/78/2423   Procedure: Plication of Left Upper Arm Fistula ;  Surgeon: Waynetta Sandy, MD;  Location: [facility];  Service: Vascular;  Laterality: Left;  . SKIN GRAFT SPLIT THICKNESS LEG / FOOT Left    SKIN GRAFT SPLIT THICKNESS LEFT ARM DONOR SITE: LEFT ANTERIOR THIGH  . SKIN SPLIT GRAFT Left 07/04/2017   Procedure: SKIN GRAFT SPLIT THICKNESS LEFT ARM DONOR SITE: LEFT ANTERIOR THIGH;  Surgeon: Elam Dutch, MD;  Location: [facility];  Service: Vascular;  Laterality: Left;  . THROMBECTOMY W/ EMBOLECTOMY Left 06/05/2017   Procedure: EXPLORATION OF LEFT ARM FOR BLEEDING; OVERSEWED PROXIMAL FISTULA;  Surgeon: Angelia Mould, MD;  Location: [facility];  Service: Vascular;  Laterality: Left;  . WOUND EXPLORATION Left 06/03/2017   Procedure: WOUND EXPLORATION WITH WOUND VAC APPLICATION TO LEFT ARM;  Surgeon: Angelia Mould, MD;  Location: [facility];  Service: Vascular;  Laterality: Left;        Home Medications    Prior to  Admission medications   Medication Sig Start Date End Date Taking? Authorizing Provider  albuterol (VENTOLIN HFA) 108 (90 Base) MCG/ACT inhaler Inhale 2 puffs into the lungs every 6 (six) hours as needed for wheezing or shortness of breath.    [provider]  aspirin 81 MG EC tablet Take 1 tablet (81 mg total) by mouth daily. 03/12/18   Geradine Girt, DO  bisacodyl (DULCOLAX) 5 MG EC tablet Take 5 mg by mouth daily as needed for moderate constipation.    [provider]  gabapentin (NEURONTIN) 100 MG capsule Take 100 mg by mouth 2 (two) times daily. 12/13/17   [provider]  hydrALAZINE (APRESOLINE) 100 MG tablet Take 1 tablet (100 mg total) by mouth 2 (two) times daily as needed. Taking after HD on Dialysis days Patient taking differently: Take 100 mg by mouth 2 (two) times daily as needed (high blood pressure). If needed on Monday, Wednesday, Friday take after dialysis 03/11/18   Geradine Girt, DO  lactulose (CHRONULAC) 10 GM/15ML solution Take 45 mLs (30 g total) by mouth 2 (two) times daily. 12/10/18   Alroy Bailiff, Zamiah Tollett, PA-C  metoprolol tartrate (LOPRESSOR) 25 MG tablet Take 1 tablet (25 mg total) by mouth 2 (two) times daily. 03/11/18   Geradine Girt, DO  mirtazapine (REMERON) 30 MG tablet Take 30 mg by mouth at bedtime.    [provider]  pantoprazole (PROTONIX) 40 MG tablet Take 1 tablet (40 mg total) by mouth daily at 12 noon. Patient taking differently: Take 40 mg by mouth See admin instructions. Take one tablet (40 mg) by mouth in the morning on Sunday, Tuesday, Thursday, Saturday, take one tablet (40 mg) after dialysis on Monday, Wednesday, Friday. May also take one tablet (40 mg) in the evening as needed for acid reflux. 03/12/18   Geradine Girt, DO  sevelamer carbonate (RENVELA) 800 MG tablet Take 4 tablets with meals  And 2 tablets twice daily with snacks Patient taking differently: Take 2,400-3,200 mg by mouth See admin instructions. Take 3 - 4  tablets (2400 - 3200 mg) up to five times daily with meals or snacks 01/22/18   Patrecia Pour, MD  traZODone (DESYREL) 50 MG tablet Take 50 mg by mouth at bedtime as needed for sleep.     [provider]    Family History Family History  Problem Relation Age of Onset  . Heart disease Mother   . Lung cancer Mother   . Heart disease Father   . Malignant hyperthermia Father   . COPD Father   . Throat cancer Sister   . Esophageal cancer Sister   . Hypertension Other   . COPD Other   . Colon cancer Neg Hx   . Colon polyps Neg Hx   . Rectal cancer Neg Hx   . Stomach cancer Neg Hx     Social History Social History   Tobacco Use  . Smoking status: Current Every Day Smoker    Packs/day: 0.50    Years: 43.00    Pack years: 21.50    Types: Cigarettes    Start date: 08/13/1973  . Smokeless tobacco: Never Used  Substance Use Topics  . Alcohol use: Not Currently    Frequency: Never    Comment: quit drinking in 2017  . Drug use: Yes    Types: Marijuana     Allergies   Morphine and related; Aspirin; Clonidine derivatives; Tramadol; and Tylenol [acetaminophen]   Review of Systems Review of Systems  Constitutional: Negative for chills and fever.  HENT: Negative for congestion and sore throat.   Eyes: Negative for redness.  Respiratory: Negative for cough and shortness of breath.   Cardiovascular: Negative for chest pain.  Gastrointestinal: Positive for abdominal distention, abdominal pain, constipation and nausea. Negative for blood in stool, diarrhea and vomiting.  Genitourinary: Negative for dysuria and frequency.  Musculoskeletal: Negative for back pain.  Skin: Negative for rash.  Neurological: Negative for headaches.     Physical Exam Updated Vital Signs BP (!) 175/90 (BP Location: Left Arm)   Pulse 67   Temp 97.6 F (36.4 C) (Oral)   Resp 16  Wt 71.7 kg   SpO2 100%   BMI 23.34 kg/m   Physical Exam Vitals signs and nursing note reviewed.   Constitutional:      Appearance: He is not ill-appearing.  HENT:     Head: Normocephalic and atraumatic.  Eyes:     Conjunctiva/sclera: Conjunctivae normal.  Neck:     Musculoskeletal: Neck supple.  Cardiovascular:     Rate and Rhythm: Normal rate and regular rhythm.     Pulses: Normal pulses.  Pulmonary:     Effort: Pulmonary effort is normal.     Breath sounds: Normal breath sounds. No wheezing, rhonchi or rales.  Abdominal:     General: There is distension.     Tenderness: There is abdominal tenderness. There is no guarding or rebound.     Comments: Obvious distension to abdomen with tympany to percussion and TTP  Skin:    General: Skin is warm and dry.  Neurological:     Mental Status: He is alert.      ED Treatments / Results  Labs (all labs ordered are listed, but only abnormal results are displayed) Labs Reviewed  COMPREHENSIVE METABOLIC PANEL - Abnormal; Notable for the following components:      Result Value   Sodium 132 (*)    Chloride 84 (*)    Glucose, Bld 118 (*)    BUN 24 (*)    Creatinine, Ser 7.96 (*)    Alkaline Phosphatase 138 (*)    Total Bilirubin 1.3 (*)    GFR calc non Af Amer 7 (*)    GFR calc Af Amer 8 (*)    Anion gap 17 (*)    All other components within normal limits  CBC WITH DIFFERENTIAL/PLATELET - Abnormal; Notable for the following components:   RBC 3.82 (*)    Hemoglobin 10.6 (*)    HCT 30.4 (*)    MCV 79.6 (*)    All other components within normal limits  LIPASE, BLOOD    EKG None  Radiology Dg Abd Acute W/chest  Result Date: 12/10/2018 CLINICAL DATA:  Constipation x3 days EXAM: DG ABDOMEN ACUTE W/ 1V CHEST COMPARISON:  Chest radiographs dated 09/21/2018. CT abdomen/pelvis dated 08/03/2018. FINDINGS: Lungs are essentially clear. Mild bibasilar atelectasis. No pleural effusion or pneumothorax. Cardiomegaly.  Thoracic aortic atherosclerosis. Mildly dilated loops of small bowel in the central abdomen, raising the possibility of  partial small bowel obstruction. However, the splenic flexure is visualized and appears normal (not decompressed). Paucity of bowel loops in the periphery suggest ascites, which may also exacerbate this appearance. Normal colonic stool burden. No evidence of free air under the diaphragm on the upright view. Vascular calcifications. Visualized osseous structures are within normal limits. IMPRESSION: Normal colonic stool burden. Paucity of bowel loops in the periphery of the abdomen suggests ascites. Mildly dilated loops of small bowel in the central abdomen at least raises the possibility of partial small bowel obstruction, although this is equivocal. Mild bibasilar atelectasis. Electronically Signed   By: Julian Hy M.D.   On: 12/10/2018 09:51    Procedures Procedures (including critical care time)  Medications Ordered in ED Medications  sodium chloride 0.9 % bolus 1,000 mL (0 mLs Intravenous Stopped 12/10/18 1057)  lactulose (CHRONULAC) 10 GM/15ML solution 20 g (20 g Oral Given 12/10/18 1121)     Initial Impression / Assessment and Plan / ED Course  I have reviewed the triage vital signs and the nursing notes.  Pertinent labs & imaging results that were available  during my care of the patient were reviewed by me and considered in my medical decision making (see chart for details).    Pt is a 58 year old male who presents with 1 week of constipation, diffuse abdominal pain, and nausea. Still passing gas. No vomiting. On dialysis, denies any missed treatments. Scheduled to go again tomorrow. Vitals stable at this time; pt afebrile in the ED and satting 100% on RA. No complaints of SOB. Concern for ascites vs SBO given pt has had multiple paracentesis procedures done in the past. Will obtain baseline labs today including CBC, CMP, lipase and obtain acute abdominal series to rule out SBO. 1 L NS bolus given as well.   CBC without leukocytosis. H&H stable. Mild hyponatremia at 132; patient  already receiving fluids. Creatinine stable at 7.96; potassium within normal limits. Lipase negative. Xrays shows mildly dilated loops of bowel but radiologist read as equivocal for SBO; also shows ascites in the abdomen. Have discussed case with attending physician Dr. Maryan Rued who agrees that if patient passing gas and not vomiting we can rule out SBO at this time; likely having pain and discomfort from constipation. Will provide dose of Lactulose in the ED and send out with prescription. Pt would like to leave and go back home prior to lactulose kicking in; I feel he is safe for discharge at this time. Strict return precautions discussed. Pt in agreement with plan and stable for discharge home.        Final Clinical Impressions(s) / ED Diagnoses   Final diagnoses:  Constipation, unspecified constipation type  Generalized abdominal pain    ED Discharge Orders         Ordered    lactulose (CHRONULAC) 10 GM/15ML solution  2 times daily     12/10/18 [address], PA-C 12/10/18 1531    Blanchie Dessert, MD 12/11/18 2210

## 2018-12-10 NOTE — ED Triage Notes (Signed)
Pt in with c/o constipation x 3 days. Denies any n/v/d, states he deals with this chronically. Pt states he usually takes Lactulose, but doc changed his meds to something else less effective.

## 2018-12-11 DIAGNOSIS — I120 Hypertensive chronic kidney disease with stage 5 chronic kidney disease or end stage renal disease: Secondary | ICD-10-CM | POA: Diagnosis not present

## 2018-12-11 DIAGNOSIS — Z992 Dependence on renal dialysis: Secondary | ICD-10-CM | POA: Diagnosis not present

## 2018-12-11 DIAGNOSIS — N186 End stage renal disease: Secondary | ICD-10-CM | POA: Diagnosis not present

## 2018-12-13 ENCOUNTER — Other Ambulatory Visit: Payer: Self-pay

## 2018-12-13 ENCOUNTER — Emergency Department (HOSPITAL_COMMUNITY): Payer: Medicare Other

## 2018-12-13 ENCOUNTER — Emergency Department (HOSPITAL_COMMUNITY)
Admission: EM | Admit: 2018-12-13 | Discharge: 2018-12-13 | Disposition: A | Discharge status: Home / Self Care | Payer: Medicare Other | Attending: Emergency Medicine | Admitting: Emergency Medicine

## 2018-12-13 DIAGNOSIS — N2581 Secondary hyperparathyroidism of renal origin: Secondary | ICD-10-CM | POA: Diagnosis not present

## 2018-12-13 DIAGNOSIS — F17210 Nicotine dependence, cigarettes, uncomplicated: Secondary | ICD-10-CM | POA: Insufficient documentation

## 2018-12-13 DIAGNOSIS — R140 Abdominal distension (gaseous): Secondary | ICD-10-CM | POA: Insufficient documentation

## 2018-12-13 DIAGNOSIS — I4891 Unspecified atrial fibrillation: Secondary | ICD-10-CM | POA: Diagnosis not present

## 2018-12-13 DIAGNOSIS — N186 End stage renal disease: Secondary | ICD-10-CM | POA: Diagnosis not present

## 2018-12-13 DIAGNOSIS — K7031 Alcoholic cirrhosis of liver with ascites: Secondary | ICD-10-CM | POA: Diagnosis not present

## 2018-12-13 DIAGNOSIS — Z23 Encounter for immunization: Secondary | ICD-10-CM | POA: Diagnosis not present

## 2018-12-13 DIAGNOSIS — Z79899 Other long term (current) drug therapy: Secondary | ICD-10-CM | POA: Insufficient documentation

## 2018-12-13 DIAGNOSIS — K7460 Unspecified cirrhosis of liver: Secondary | ICD-10-CM | POA: Insufficient documentation

## 2018-12-13 DIAGNOSIS — R0602 Shortness of breath: Secondary | ICD-10-CM | POA: Insufficient documentation

## 2018-12-13 DIAGNOSIS — R1084 Generalized abdominal pain: Secondary | ICD-10-CM | POA: Diagnosis not present

## 2018-12-13 DIAGNOSIS — J449 Chronic obstructive pulmonary disease, unspecified: Secondary | ICD-10-CM | POA: Diagnosis not present

## 2018-12-13 DIAGNOSIS — Z992 Dependence on renal dialysis: Secondary | ICD-10-CM | POA: Diagnosis not present

## 2018-12-13 DIAGNOSIS — R188 Other ascites: Secondary | ICD-10-CM | POA: Insufficient documentation

## 2018-12-13 DIAGNOSIS — I252 Old myocardial infarction: Secondary | ICD-10-CM | POA: Diagnosis not present

## 2018-12-13 DIAGNOSIS — I120 Hypertensive chronic kidney disease with stage 5 chronic kidney disease or end stage renal disease: Secondary | ICD-10-CM | POA: Insufficient documentation

## 2018-12-13 DIAGNOSIS — D631 Anemia in chronic kidney disease: Secondary | ICD-10-CM | POA: Diagnosis not present

## 2018-12-13 DIAGNOSIS — R109 Unspecified abdominal pain: Secondary | ICD-10-CM | POA: Diagnosis not present

## 2018-12-13 DIAGNOSIS — R8569 Abnormal cytological findings in specimens from other digestive organs and abdominal cavity: Secondary | ICD-10-CM | POA: Diagnosis not present

## 2018-12-13 DIAGNOSIS — F1210 Cannabis abuse, uncomplicated: Secondary | ICD-10-CM | POA: Insufficient documentation

## 2018-12-13 DIAGNOSIS — D509 Iron deficiency anemia, unspecified: Secondary | ICD-10-CM | POA: Diagnosis not present

## 2018-12-13 LAB — BODY FLUID CELL COUNT WITH DIFFERENTIAL
Eos, Fluid: 0 %
Lymphs, Fluid: 20 %
Monocyte-Macrophage-Serous Fluid: 79 % (ref 50–90)
Neutrophil Count, Fluid: 1 % (ref 0–25)
Total Nucleated Cell Count, Fluid: 163 cu mm (ref 0–1000)

## 2018-12-13 LAB — COMPREHENSIVE METABOLIC PANEL
ALT: 21 U/L (ref 0–44)
AST: 48 U/L — ABNORMAL HIGH (ref 15–41)
Albumin: 3.9 g/dL (ref 3.5–5.0)
Alkaline Phosphatase: 126 U/L (ref 38–126)
Anion gap: 20 — ABNORMAL HIGH (ref 5–15)
BUN: 22 mg/dL — ABNORMAL HIGH (ref 6–20)
CO2: 25 mmol/L (ref 22–32)
Calcium: 9.3 mg/dL (ref 8.9–10.3)
Chloride: 90 mmol/L — ABNORMAL LOW (ref 98–111)
Creatinine, Ser: 7.8 mg/dL — ABNORMAL HIGH (ref 0.61–1.24)
GFR calc Af Amer: 8 mL/min — ABNORMAL LOW (ref >60)
GFR calc non Af Amer: 7 mL/min — ABNORMAL LOW (ref >60)
Glucose, Bld: 99 mg/dL (ref 70–99)
Potassium: 3.3 mmol/L — ABNORMAL LOW (ref 3.5–5.1)
Sodium: 135 mmol/L (ref 135–145)
Total Bilirubin: 1.4 mg/dL — ABNORMAL HIGH (ref 0.3–1.2)
Total Protein: 7.9 g/dL (ref 6.5–8.1)

## 2018-12-13 LAB — CBC WITH DIFFERENTIAL/PLATELET
Abs Immature Granulocytes: 0.04 10*3/uL (ref 0.00–0.07)
Basophils Absolute: 0.1 10*3/uL (ref 0.0–0.1)
Basophils Relative: 1 %
Eosinophils Absolute: 0.1 10*3/uL (ref 0.0–0.5)
Eosinophils Relative: 1 %
HCT: 30.6 % — ABNORMAL LOW (ref 39.0–52.0)
Hemoglobin: 10.7 g/dL — ABNORMAL LOW (ref 13.0–17.0)
Immature Granulocytes: 0 %
Lymphocytes Relative: 17 %
Lymphs Abs: 1.6 10*3/uL (ref 0.7–4.0)
MCH: 27.5 pg (ref 26.0–34.0)
MCHC: 35 g/dL (ref 30.0–36.0)
MCV: 78.7 fL — ABNORMAL LOW (ref 80.0–100.0)
Monocytes Absolute: 0.8 10*3/uL (ref 0.1–1.0)
Monocytes Relative: 9 %
Neutro Abs: 6.4 10*3/uL (ref 1.7–7.7)
Neutrophils Relative %: 72 %
Platelets: 150 10*3/uL (ref 150–400)
RBC: 3.89 MIL/uL — ABNORMAL LOW (ref 4.22–5.81)
RDW: 15.6 % — ABNORMAL HIGH (ref 11.5–15.5)
WBC: 8.9 10*3/uL (ref 4.0–10.5)
nRBC: 0.2 % (ref 0.0–0.2)

## 2018-12-13 LAB — ALBUMIN, PLEURAL OR PERITONEAL FLUID: Albumin, Fluid: 2.4 g/dL

## 2018-12-13 LAB — GRAM STAIN

## 2018-12-13 LAB — PROTEIN, PLEURAL OR PERITONEAL FLUID: Total protein, fluid: 4.3 g/dL

## 2018-12-13 LAB — GLUCOSE, PLEURAL OR PERITONEAL FLUID: Glucose, Fluid: 96 mg/dL

## 2018-12-13 IMAGING — CT CT HUMERUS*L* W/CM
3 of 5 series · 8 of 35 positions shown, 10 images · IV contrast (agent unspecified)
Comparison: None.

CONTRAST:  100 ml D9H1P7-D66 IOPAMIDOL (D9H1P7-D66) INJECTION 61%

CLINICAL DATA: Dialysis patient with a history of an arteriovenous
fistula and an open wound on the left upper arm. Question abscess.

EXAM:
CT OF THE UPPER LEFT EXTREMITY WITH CONTRAST
TECHNIQUE: Multidetector CT imaging of the upper left extremity was performed
according to the standard protocol following intravenous contrast
administration.

[Series 6: cor bone · coronal · 0.42mm/px · 1 of 126 slices shown]
[im 63/126  bone]
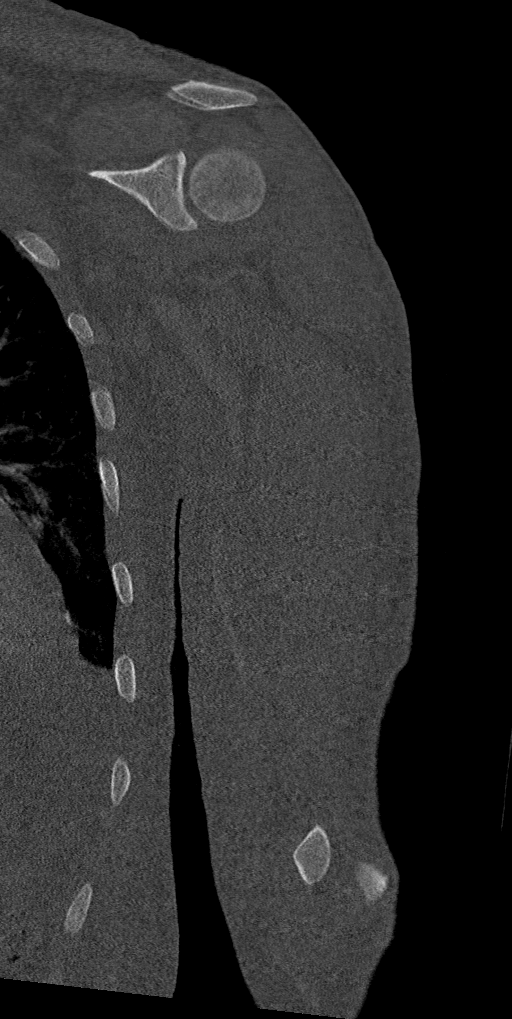

[Series 8: ax st · axial · 0.38mm/px · z∈[-140,+3]mm · 2 of 289 slices shown, 3 images]
[im 97/289  soft-tissue]
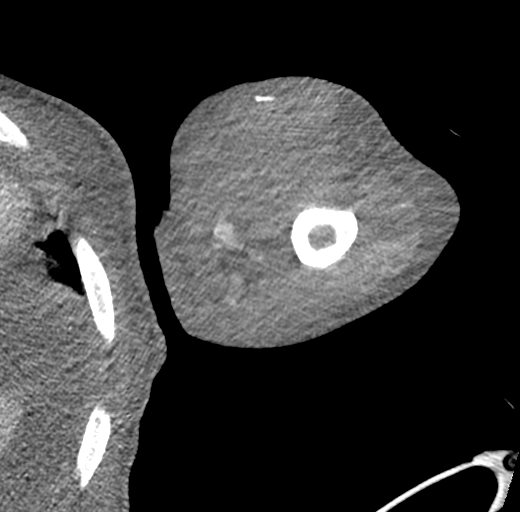
[im 97/289  bone]
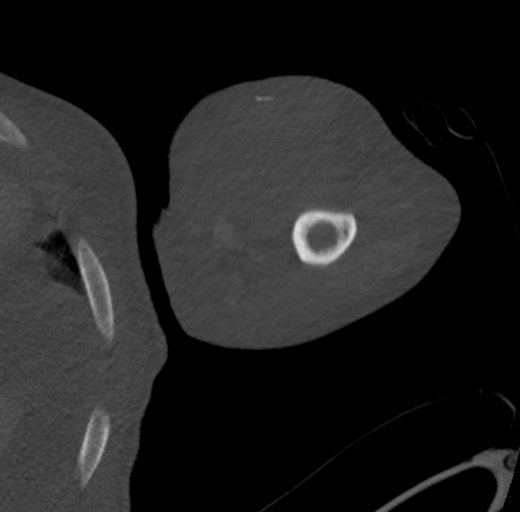
[im 193/289  bone]
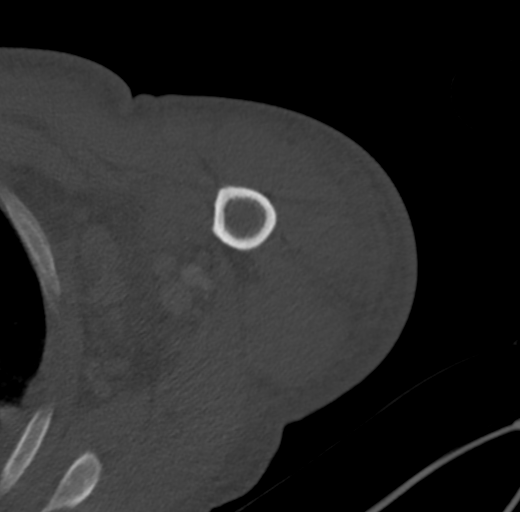

[Series 10: sag st · sagittal · 0.37mm/px · 5 of 126 slices shown, 6 images]
[im 42/126  bone]
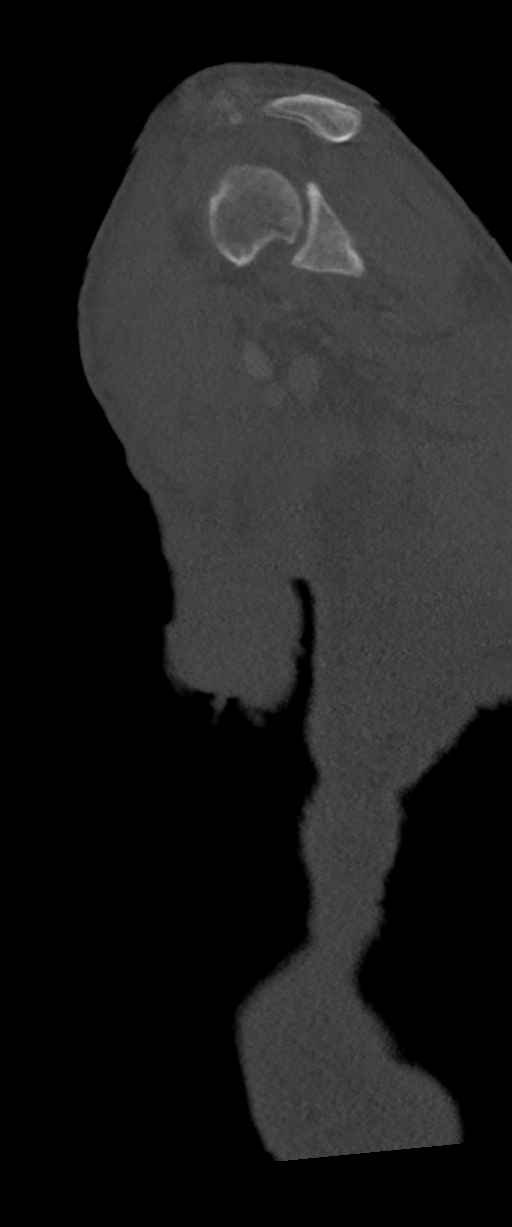
[im 53/126  bone]
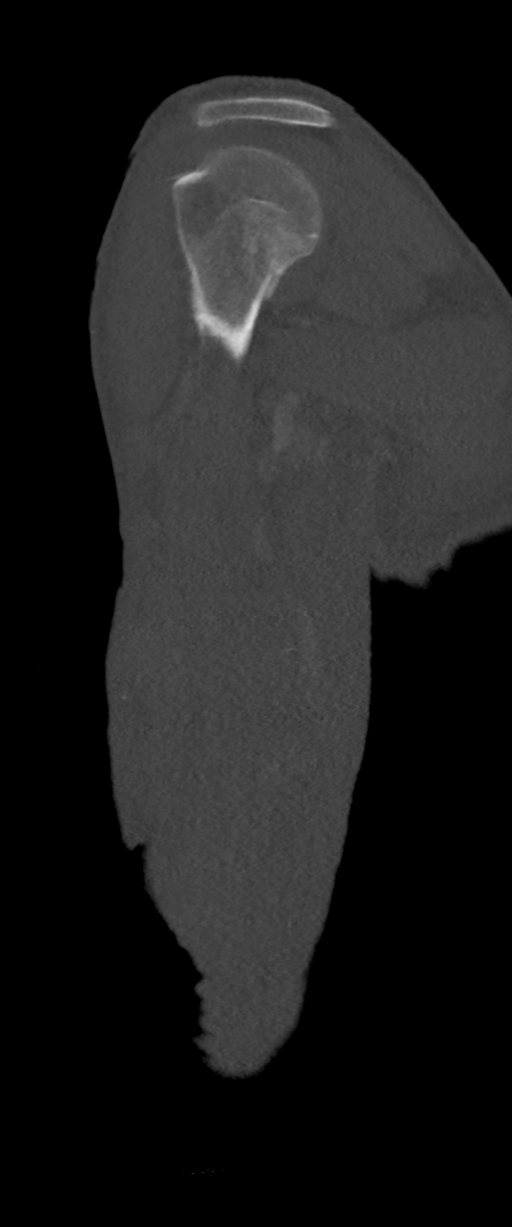
[im 63/126  soft-tissue]
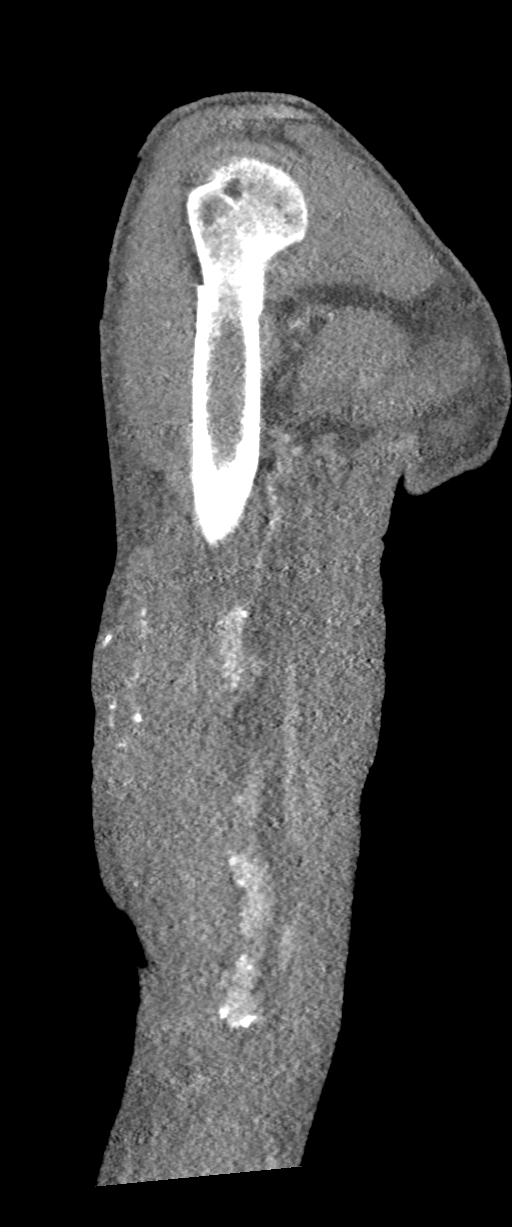
[im 63/126  bone]
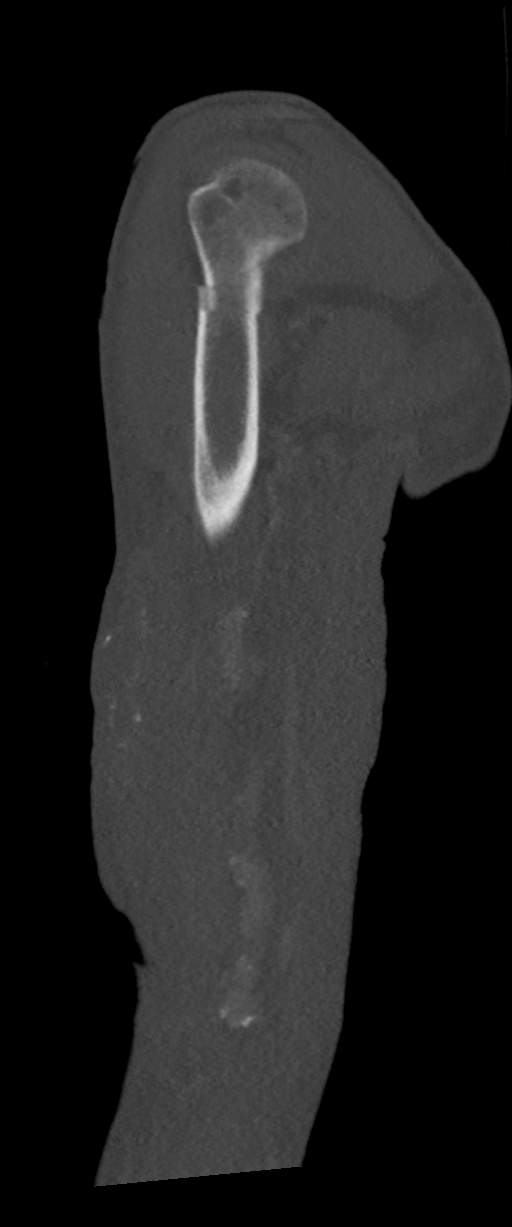
[im 73/126  bone]
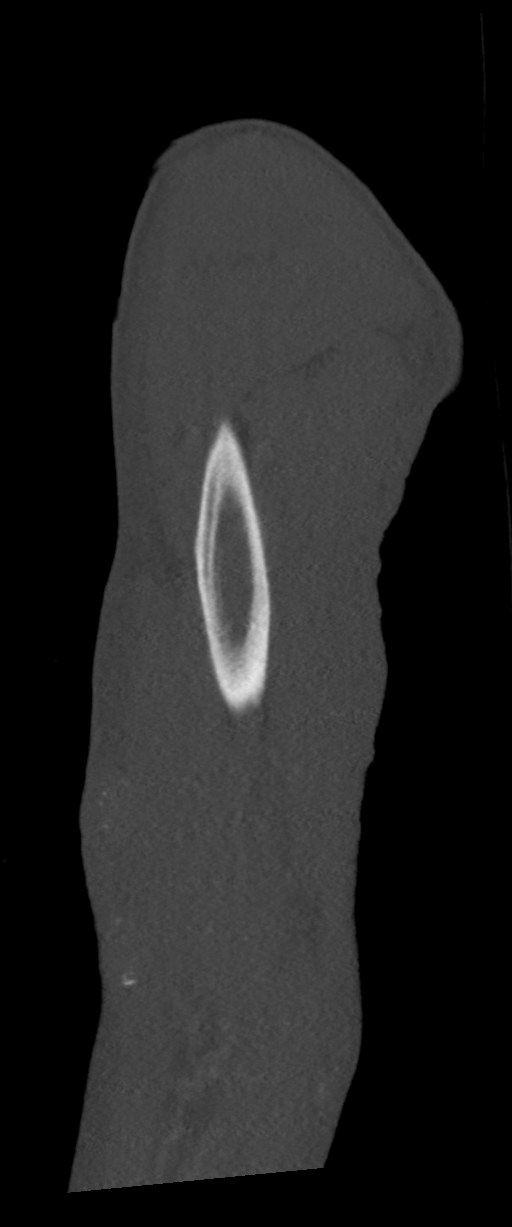
[im 84/126  bone]
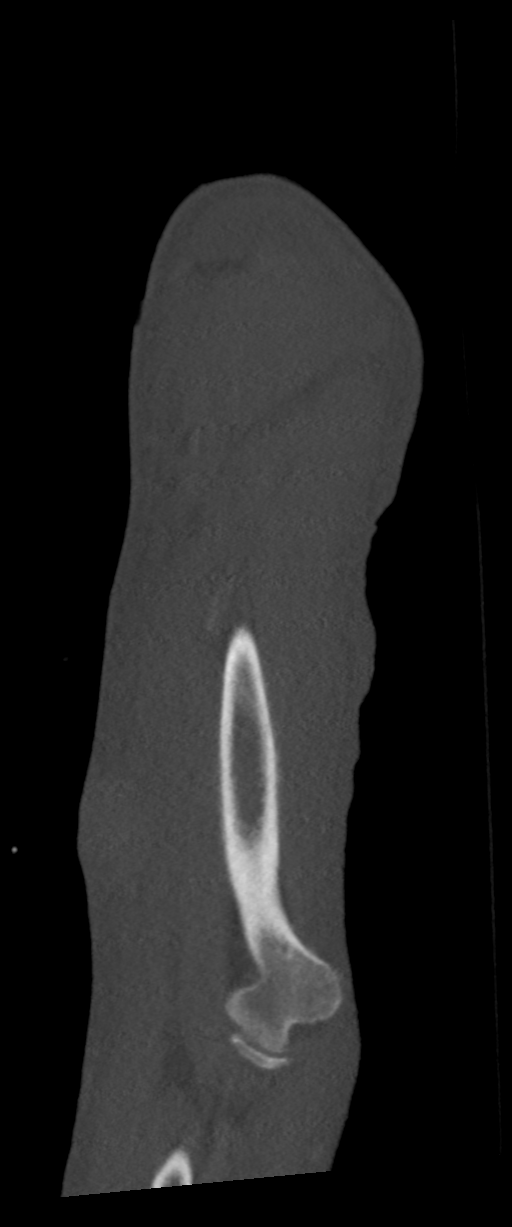

[8 of 35 positions shown; findings below may reference images not displayed]

FINDINGS: Bones/Joint/Cartilage

No acute or focal abnormality is identified. No periosteal reaction
is seen.

Ligaments

Suboptimally assessed by CT.

Muscles and Tendons

No intramuscular fluid collection is seen. Musculature appears
somewhat edematous, particularly the biceps, suggestive of
infectious or inflammatory change.

Soft tissues

There is stranding in subcutaneous fat diffusely consistent with
cellulitis. No soft tissue gas is identified. Skin defect just
superior to the elbow correlates with the patient's history of
nonhealing wound. There is partial visualization of a small left
pleural effusion. Atherosclerotic vascular disease is noted.
Prominent left axillary lymph nodes have clearly visible fatty hila
no most consistent with reactive change.
IMPRESSION: Soft tissue wound anterior aspect of the left upper arm without
underlying abscess or evidence of osteomyelitis.

Diffuse subcutaneous edema about the upper arm could be due to
volume overload or cellulitis. Musculature of the upper arm appears
somewhat edematous which could be due to infectious or inflammatory
change or volume overload.

Small right pleural effusion.

Atherosclerosis.

## 2018-12-13 MED ORDER — ALBUMIN HUMAN 25 % IV SOLN
12.5000 g | Freq: Once | INTRAVENOUS | Status: AC
  Administered 2018-12-13: 12.5 g via INTRAVENOUS
  Filled 2018-12-13: qty 50

## 2018-12-13 MED ORDER — LIDOCAINE HCL (PF) 1 % IJ SOLN
INTRAMUSCULAR | Status: AC
  Filled 2018-12-13: qty 30

## 2018-12-13 NOTE — ED Notes (Signed)
Pt refusing IV start . Pt states he doesn't feel he needs one since blood has been drawn

## 2018-12-13 NOTE — ED Notes (Signed)
Pt refusing monitoring

## 2018-12-13 NOTE — ED Notes (Signed)
Pt refusing to keep monitoring equipment on.  

## 2018-12-13 NOTE — ED Provider Notes (Signed)
Pt's care assumed at [facility] from [facility].  Pt had large amount of ascites.  Pt refuses any further evaluation or treatment.  Pt advised to follow up with dialysis center to finish dialysis    Sidney Ace 12/13/18 [place], [place], MD 12/19/18 [id number]

## 2018-12-13 NOTE — Procedures (Signed)
PROCEDURE SUMMARY:  Successful image-guided paracentesis from the left lower abdomen.  Yielded 2.45 liters of hazy amber fluid.  No immediate complications.  EBL = 0 mL. Patient tolerated well.   Specimen was sent for labs.  Earley Abide PA-C 12/13/2018 4:43 PM

## 2018-12-13 NOTE — ED Notes (Signed)
Pt insistent albumin be stopped. Pt was advised he should finish treatment. Pt verbalized understand but declined any further medication. Pt states "I have given you enough of my time I am ready to go home".  PA aware and medication stopped. IV removed and bleeding controlled. Pt ambulatory to lobby with steady gait. Discharge papers reviewed and pt verbalized understanding.   Pt given food and drink

## 2018-12-13 NOTE — Discharge Instructions (Addendum)
Return if any problems.  See your Physician for recheck.  Call your dialysis center to finish dialysis

## 2018-12-13 NOTE — ED Provider Notes (Signed)
[facility] EMERGENCY DEPARTMENT Provider Note   CSN: [id number] Arrival date & time: 12/13/18  1222    History   Chief Complaint Chief Complaint  Patient presents with   Abdominal Pain    HPI [name] is a 58 y.o. male.     HPI   [name] is a 58 y.o. male, with a history of anemia, anxiety, hepatic cirrhosis, COPD, ESRD on dialysis, presenting to the ED with abdominal discomfort for the last 3 weeks.  Also notes abdominal distention as well as shortness of breath.  His discomfort is described as a pressure, generalized, moderate, nonradiating.  He states it is from the buildup of ascites.  He is requesting paracentesis.  He states he has had to undergo a paracentesis before, but cannot remember when the last episode occurred. Denies alcohol use.  Bowel movement was yesterday and was soft, but normal for him. He is on a Monday, Wednesday, Friday dialysis schedule.  He did go to his dialysis session this morning, but did not complete a full session due to his abdominal discomfort. Denies fever/chills, nausea/vomiting/diarrhea, cough, chest pain, hematochezia/melena, or any other complaints.      Past Medical History:  Diagnosis Date   Anemia    Anxiety    Arthritis    left shoulder   Atherosclerosis of aorta (HCC)    Cardiomegaly    Chest pain    DATE UNKNOWN, C/O PERIODICALLY   Cocaine abuse (HCC)    COPD exacerbation (HCC) 08/17/2016   Coronary artery disease    stent 02/22/17   ESRD (end stage renal disease) on dialysis ([place])    "[name]; MWF" (07/04/2017)   GERD (gastroesophageal reflux disease)    DATE UNKNOWN   Hemorrhoids    Hepatitis B, chronic (HCC)    Hepatitis C    History of kidney stones    Hyperkalemia    Hypertension    Kidney failure    Metabolic bone disease    Patient denies   Mitral stenosis    Myocardial infarction [facility])    Pneumonia    Pulmonary edema    Solitary  rectal ulcer syndrome 07/2017   at flex sig for rectal bleeding   Tubular adenoma of colon     Patient Active Problem List   Diagnosis Date Noted   Sepsis ([place]) 09/12/2018   Atherosclerosis of native coronary artery of native heart without angina pectoris 03/11/2018   Benign neoplasm of cecum    Benign neoplasm of ascending colon    Benign neoplasm of descending colon    Benign neoplasm of rectum    Paroxysmal atrial fibrillation ([place]) 01/23/2018   Hx of colonic polyps 01/20/2018   ESRD (end stage renal disease) ([place]) 11/21/2017   GERD (gastroesophageal reflux disease) 11/16/2017   Liver cirrhosis ([place]) 11/15/2017   DNR (do not resuscitate)    Palliative care by specialist    Hyponatremia 11/04/2017   SBP (spontaneous bacterial peritonitis) ([place]) 10/30/2017   Liver disease, chronic 10/30/2017   SOB (shortness of breath)    Abdominal pain 10/28/2017   Upper airway cough syndrome with flattening on f/v loop 10/13/17 c/w vcd 10/17/2017   Elevated diaphragm 10/13/2017   Ileus ([place]) 09/29/2017   Malnutrition of moderate degree 09/29/2017   Sinus congestion 09/03/2017   Symptomatic anemia 09/02/2017   Other cirrhosis of liver ([place]) 09/02/2017   Left bundle branch block 09/02/2017   Mitral stenosis 09/02/2017   Hematochezia 07/15/2017   Wide-complex tachycardia (  [place])    Endotracheally intubated    ESRD on dialysis ([place]) 07/04/2017   Acute respiratory failure with hypoxia ([place]) 06/18/2017   CKD (chronic kidney disease) stage V requiring chronic dialysis ([place]) 06/18/2017   History of Cocaine abuse ([place]) 06/18/2017   Hypertension 06/18/2017   Infection of AV graft for dialysis ([place]) 06/18/2017   Anxiety 06/18/2017   Anemia due to chronic kidney disease 06/18/2017   Atrial flutter with rapid ventricular response ([place]) 06/18/2017   Personality disorder ([place]) 06/13/2017   Cellulitis 06/12/2017   Adjustment disorder with mixed anxiety and  depressed mood 06/10/2017   Suicidal ideation 06/10/2017   Arm wound, left, sequela 06/10/2017   Dyspnea on exertion 05/29/2017   Tachycardia 05/29/2017   Hyperkalemia 32/99/2426   Acute metabolic encephalopathy    Anemia 04/23/2017   Ascites 04/23/2017   COPD (chronic obstructive pulmonary disease) ([place]) 04/23/2017   Acute on chronic respiratory failure with hypoxia ([place]) 03/25/2017   Arrhythmia 03/25/2017   COPD GOLD 0 with flattening on inps f/v  09/27/2016   Essential hypertension 09/27/2016   Fluid overload 08/30/2016   COPD exacerbation ([place]) 08/17/2016   Hypertensive urgency 08/17/2016   Respiratory failure ([place]) 08/17/2016   Problem with dialysis access ([place]) 07/23/2016   Chronic hepatitis B ([place]) 03/05/2014   Chronic hepatitis C without hepatic coma ([place]) 03/05/2014   Internal hemorrhoids with bleeding, swelling and itching 03/05/2014   Thrombocytopenia ([place]) 03/05/2014   Chest pain 02/27/2014   Alcohol abuse 04/14/2009   Cigarette smoker 04/14/2009   GANGLION CYST 04/14/2009    Past Surgical History:  Procedure Laterality Date   A/V FISTULAGRAM Left 05/26/2017   Procedure: A/V FISTULAGRAM;  Surgeon: Conrad Bruno, MD;  Location: [place] [facility];  Service: Cardiovascular;  Laterality: Left;   A/V FISTULAGRAM Right 11/18/2017   Procedure: A/V FISTULAGRAM - Right Arm;  Surgeon: Elam Dutch, MD;  Location: [place] [facility];  Service: Cardiovascular;  Laterality: Right;   APPLICATION OF WOUND VAC Left 06/14/2017   Procedure: APPLICATION OF WOUND VAC;  Surgeon: Katha Cabal, MD;  Location: [facility] ORS;  Service: Vascular;  Laterality: Left;   AV FISTULA PLACEMENT  2012   BELIEVED WAS PLACED IN JUNE   AV FISTULA PLACEMENT Right 08/09/2017   Procedure: Creation Right arm ARTERIOVENOUS BRACHIOCEPOHALIC FISTULA;  Surgeon: Elam Dutch, MD;  Location: [facility] OR;  Service: Vascular;  Laterality: Right;   AV FISTULA PLACEMENT Right  11/22/2017   Procedure: INSERTION OF ARTERIOVENOUS (AV) GORE-TEX GRAFT RIGHT UPPER ARM;  Surgeon: Elam Dutch, MD;  Location: [place];  Service: Vascular;  Laterality: Right;   BIOPSY  01/25/2018   Procedure: BIOPSY;  Surgeon: Jerene Bears, MD;  Location: [place];  Service: Gastroenterology;;   COLONOSCOPY     COLONOSCOPY WITH PROPOFOL N/A 01/25/2018   Procedure: COLONOSCOPY WITH PROPOFOL;  Surgeon: Jerene Bears, MD;  Location: [place];  Service: Gastroenterology;  Laterality: N/A;   CORONARY STENT INTERVENTION N/A 02/22/2017   Procedure: CORONARY STENT INTERVENTION;  Surgeon: Nigel Mormon, MD;  Location: [place] [facility];  Service: Cardiovascular;  Laterality: N/A;   ESOPHAGOGASTRODUODENOSCOPY (EGD) WITH PROPOFOL N/A 01/25/2018   Procedure: ESOPHAGOGASTRODUODENOSCOPY (EGD) WITH PROPOFOL;  Surgeon: Jerene Bears, MD;  Location: [place];  Service: Gastroenterology;  Laterality: N/A;   FLEXIBLE SIGMOIDOSCOPY N/A 07/15/2017   Procedure: FLEXIBLE SIGMOIDOSCOPY;  Surgeon: Carol Ada, MD;  Location: [place];  Service: Endoscopy;  Laterality: N/A;   HEMORRHOID BANDING  I&D EXTREMITY Left 06/01/2017   Procedure: IRRIGATION AND DEBRIDEMENT LEFT ARM HEMATOMA WITH LIGATION OF LEFT ARM AV FISTULA;  Surgeon: Elam Dutch, MD;  Location: [place];  Service: Vascular;  Laterality: Left;   I&D EXTREMITY Left 06/14/2017   Procedure: IRRIGATION AND DEBRIDEMENT EXTREMITY;  Surgeon: Katha Cabal, MD;  Location: [facility];  Service: Vascular;  Laterality: Left;   INSERTION OF DIALYSIS CATHETER  05/30/2017   INSERTION OF DIALYSIS CATHETER N/A 05/30/2017   Procedure: INSERTION OF DIALYSIS CATHETER;  Surgeon: Elam Dutch, MD;  Location: [place];  Service: Vascular;  Laterality: N/A;   IR PARACENTESIS  08/30/2017   IR PARACENTESIS  09/29/2017   IR PARACENTESIS  10/28/2017   IR PARACENTESIS  11/09/2017   IR PARACENTESIS  11/16/2017   IR PARACENTESIS  11/28/2017     IR PARACENTESIS  12/01/2017   IR PARACENTESIS  12/06/2017   IR PARACENTESIS  01/03/2018   IR PARACENTESIS  01/23/2018   IR PARACENTESIS  02/07/2018   IR PARACENTESIS  02/21/2018   IR PARACENTESIS  03/06/2018   IR PARACENTESIS  03/17/2018   IR PARACENTESIS  04/04/2018   IR RADIOLOGIST EVAL & MGMT  02/14/2018   LEFT HEART CATH AND CORONARY ANGIOGRAPHY N/A 02/22/2017   Procedure: LEFT HEART CATH AND CORONARY ANGIOGRAPHY;  Surgeon: Nigel Mormon, MD;  Location: [place] [facility];  Service: Cardiovascular;  Laterality: N/A;   LIGATION OF ARTERIOVENOUS  FISTULA Left 09/17/2503   Procedure: Plication of Left Arm Arteriovenous Fistula;  Surgeon: Elam Dutch, MD;  Location: [facility] OR;  Service: Vascular;  Laterality: Left;   POLYPECTOMY     POLYPECTOMY  01/25/2018   Procedure: POLYPECTOMY;  Surgeon: Jerene Bears, MD;  Location: [place];  Service: Gastroenterology;;   REVISON OF ARTERIOVENOUS FISTULA Left 3/97/6734   Procedure: PLICATION OF DISTAL ANEURYSMAL SEGEMENT OF LEFT UPPER ARM ARTERIOVENOUS FISTULA;  Surgeon: Elam Dutch, MD;  Location: [place];  Service: Vascular;  Laterality: Left;   REVISON OF ARTERIOVENOUS FISTULA Left 1/93/7902   Procedure: Plication of Left Upper Arm Fistula ;  Surgeon: Waynetta Sandy, MD;  Location: [place];  Service: Vascular;  Laterality: Left;   SKIN GRAFT SPLIT THICKNESS LEG / FOOT Left    SKIN GRAFT SPLIT THICKNESS LEFT ARM DONOR SITE: LEFT ANTERIOR THIGH   SKIN SPLIT GRAFT Left 07/04/2017   Procedure: SKIN GRAFT SPLIT THICKNESS LEFT ARM DONOR SITE: LEFT ANTERIOR THIGH;  Surgeon: Elam Dutch, MD;  Location: [place];  Service: Vascular;  Laterality: Left;   THROMBECTOMY W/ EMBOLECTOMY Left 06/05/2017   Procedure: EXPLORATION OF LEFT ARM FOR BLEEDING; OVERSEWED PROXIMAL FISTULA;  Surgeon: Angelia Mould, MD;  Location: [place];  Service: Vascular;  Laterality: Left;   WOUND EXPLORATION Left 06/03/2017   Procedure: WOUND  EXPLORATION WITH WOUND VAC APPLICATION TO LEFT ARM;  Surgeon: Angelia Mould, MD;  Location: [facility] OR;  Service: Vascular;  Laterality: Left;        Home Medications    Prior to Admission medications   Medication Sig Start Date End Date Taking? Authorizing Provider  albuterol (VENTOLIN HFA) 108 (90 Base) MCG/ACT inhaler Inhale 2 puffs into the lungs every 6 (six) hours as needed for wheezing or shortness of breath.   Yes [provider]  aspirin 81 MG EC tablet Take 1 tablet (81 mg total) by mouth daily. 03/12/18  Yes Vann, Jessica U, DO  bisacodyl (DULCOLAX) 5 MG EC tablet Take 5 mg by mouth  daily as needed for moderate constipation.   Yes [provider]  hydrALAZINE (APRESOLINE) 100 MG tablet Take 1 tablet (100 mg total) by mouth 2 (two) times daily as needed. Taking after HD on Dialysis days Patient taking differently: Take 100 mg by mouth 2 (two) times daily as needed (high blood pressure). If needed on Monday, Wednesday, Friday take after dialysis 03/11/18  Yes Vann, Jessica U, DO  lactulose (CHRONULAC) 10 GM/15ML solution Take 45 mLs (30 g total) by mouth 2 (two) times daily. 12/10/18  Yes Venter, Margaux, PA-C  metoprolol tartrate (LOPRESSOR) 25 MG tablet Take 1 tablet (25 mg total) by mouth 2 (two) times daily. 03/11/18  Yes Vann, Jessica U, DO  pantoprazole (PROTONIX) 40 MG tablet Take 1 tablet (40 mg total) by mouth daily at 12 noon. Patient taking differently: Take 40 mg by mouth See admin instructions. Take one tablet (40 mg) by mouth in the morning on Sunday, Tuesday, Thursday, Saturday, take one tablet (40 mg) after dialysis on Monday, Wednesday, Friday. May also take one tablet (40 mg) in the evening as needed for acid reflux. 03/12/18  Yes Geradine Girt, DO  sevelamer carbonate (RENVELA) 800 MG tablet Take 4 tablets with meals  And 2 tablets twice daily with snacks Patient taking differently: Take 2,400-3,200 mg by mouth See admin instructions. Take 3 - 4  tablets (2400 - 3200 mg) up to five times daily with meals or snacks 01/22/18  Yes Patrecia Pour, MD  SYMBICORT 160-4.5 MCG/ACT inhaler Inhale 2 puffs into the lungs 2 (two) times a day. 11/30/18  Yes [provider]  zolpidem (AMBIEN) 5 MG tablet Take 5 mg by mouth at bedtime. 12/08/18  Yes [provider]    Family History Family History  Problem Relation Age of Onset   Heart disease Mother    Lung cancer Mother    Heart disease Father    Malignant hyperthermia Father    COPD Father    Throat cancer Sister    Esophageal cancer Sister    Hypertension Other    COPD Other    Colon cancer Neg Hx    Colon polyps Neg Hx    Rectal cancer Neg Hx    Stomach cancer Neg Hx     Social History Social History   Tobacco Use   Smoking status: Current Every Day Smoker    Packs/day: 0.50    Years: 43.00    Pack years: 21.50    Types: Cigarettes    Start date: 08/13/1973   Smokeless tobacco: Never Used  Substance Use Topics   Alcohol use: Not Currently    Frequency: Never    Comment: quit drinking in 2017   Drug use: Yes    Types: Marijuana     Allergies   Morphine and related; Aspirin; Clonidine derivatives; Tramadol; and Tylenol [acetaminophen]   Review of Systems Review of Systems  Constitutional: Negative for chills, diaphoresis and fever.  Respiratory: Positive for shortness of breath. Negative for cough.   Cardiovascular: Negative for chest pain and leg swelling.  Gastrointestinal: Positive for abdominal distention and abdominal pain. Negative for blood in stool, diarrhea, nausea and vomiting.  Neurological: Negative for dizziness, syncope and weakness.  All other systems reviewed and are negative.    Physical Exam Updated Vital Signs BP (!) 199/95 (BP Location: Left Arm)    Pulse (!) 57    Temp 97.6 F (36.4 C) (Oral)    Resp (!) 25    SpO2  99%   Physical Exam Vitals signs and nursing note reviewed.  Constitutional:      General:  He is not in acute distress.    Appearance: He is well-developed. He is not diaphoretic.  HENT:     Head: Normocephalic and atraumatic.     Mouth/Throat:     Mouth: Mucous membranes are moist.     Pharynx: Oropharynx is clear.  Eyes:     Conjunctiva/sclera: Conjunctivae normal.  Neck:     Musculoskeletal: Neck supple.  Cardiovascular:     Rate and Rhythm: Normal rate and regular rhythm.     Pulses: Normal pulses.          Radial pulses are 2+ on the right side and 2+ on the left side.       Posterior tibial pulses are 2+ on the right side and 2+ on the left side.     Heart sounds: Normal heart sounds.     Comments: Tactile temperature in the extremities appropriate and equal bilaterally. Pulmonary:     Effort: Pulmonary effort is normal. No respiratory distress.     Breath sounds: Normal breath sounds.  Abdominal:     General: There is distension.     Palpations: There is fluid wave.     Tenderness: There is generalized abdominal tenderness. There is no guarding.     Comments: Abdomen is firm with suspected ascites.  Musculoskeletal:     Right lower leg: No edema.     Left lower leg: No edema.  Lymphadenopathy:     Cervical: No cervical adenopathy.  Skin:    General: Skin is warm and dry.  Neurological:     Mental Status: He is alert.  Psychiatric:        Mood and Affect: Mood and affect normal.        Speech: Speech normal.        Behavior: Behavior normal.      ED Treatments / Results  Labs (all labs ordered are listed, but only abnormal results are displayed) Labs Reviewed  COMPREHENSIVE METABOLIC PANEL - Abnormal; Notable for the following components:      Result Value   Potassium 3.3 (*)    Chloride 90 (*)    BUN 22 (*)    Creatinine, Ser 7.80 (*)    AST 48 (*)    Total Bilirubin 1.4 (*)    GFR calc non Af Amer 7 (*)    GFR calc Af Amer 8 (*)    Anion gap 20 (*)    All other components within normal limits  CBC WITH DIFFERENTIAL/PLATELET - Abnormal;  Notable for the following components:   RBC 3.89 (*)    Hemoglobin 10.7 (*)    HCT 30.6 (*)    MCV 78.7 (*)    RDW 15.6 (*)    All other components within normal limits   Hemoglobin  Date Value Ref Range Status  12/13/2018 10.7 (L) 13.0 - 17.0 g/dL Final  12/10/2018 10.6 (L) 13.0 - 17.0 g/dL Final  09/20/2018 10.0 (L) 13.0 - 17.0 g/dL Final  09/12/2018 10.9 (L) 13.0 - 17.0 g/dL Final   EKG EKG Interpretation  Date/Time:  Wednesday December 13 2018 13:37:07 EDT Ventricular Rate:  64 PR Interval:    QRS Duration: 179 QT Interval:  575 QTC Calculation: 594 R Axis:   -60 Text Interpretation:  Atrial fibrillation Nonspecific IVCD with LAD LVH with secondary repolarization abnormality similar to prior 3/20 Confirmed by Aletta Edouard [phone]) on  12/13/2018 1:46:25 PM   Radiology No results found.  Procedures Procedures (including critical care time)  Medications Ordered in ED Medications  albumin human 25 % solution 12.5 g (has no administration in time range)     Initial Impression / Assessment and Plan / ED Course  I have reviewed the triage vital signs and the nursing notes.  Pertinent labs & imaging results that were available during my care of the patient were reviewed by me and considered in my medical decision making (see chart for details).  Clinical Course as of Dec 12 1621  Wed Dec 12, 8064  [id number] 58 year old male dialysis dependent with history of cirrhosis here with increased abdominal distention that is causing him difficulty eek eating and breathing secondary to his fullness.  No real abdominal pain.  No fevers.  It does not look like he has been tapped for at least 7 or 8 months.  Checking some basic labs and will look and see if he has a large and a fluid pocket although he does not appear in any distress.   [MB]  2409 Spoke with ED Pharmacist, Aleene Davidson. Inquired about dosing on albumin for use during paracentesis.  States he will call back.   [SJ]  [place]  states a good initial dosing of the albumin is 12.5 g of 25% albumin.   [SJ]  7353 Paged Interventional radiologist.    [SJ]  [facility] Reading Room to speak with IR on call. They state he is away from his desk. Left message for callback.    [SJ]    Clinical Course User Index [MB] Hayden Rasmussen, MD [SJ] Lorayne Bender, PA-C       Patient presents with abdominal distention and discomfort. Patient is nontoxic appearing, afebrile, not tachycardic, not tachypneic, not hypotensive, excellent SPO2 on room air, and is in no apparent distress.   Patient would not allow multiple sets of vital signs to be taken.  He kept telling us, "Just stick a needle in there and pull the fluid off.  I do not need anything else."  We tried to explain to the patient that there were things that would need to be done in order to ensure paracentesis could be performed safely.   Findings and plan of care discussed with Aletta Edouard, MD. Dr. Melina Copa personally evaluated and examined this patient.  End of shift patient care handoff report given to Alyse Low, PA-C. Plan: Awaiting callback from IR to set patient up for paracentesis.  Vitals:   12/13/18 1223 12/13/18 1224 12/13/18 1232  BP:   (!) 199/95  Pulse:  68 (!) 57  Resp:  16 (!) 25  Temp:  97.6 F (36.4 C)   TempSrc:  Oral   SpO2: 100% 98% 99%     Final Clinical Impressions(s) / ED Diagnoses   Final diagnoses:  Cirrhosis of liver with ascites [facility])    ED Discharge Orders    None       Layla Maw 12/13/18 1627    Hayden Rasmussen, MD 12/13/18 [phone]

## 2018-12-13 NOTE — ED Triage Notes (Signed)
Pt arrives by EMS with complaints of right upper quadrant pain. Pt reports he has been having this pain X2 weeks. Pt came from dialysis when he called EMS.  MWF treatments.

## 2018-12-13 NOTE — ED Notes (Signed)
Pt refusing cardiac monitoring. 

## 2018-12-13 NOTE — ED Notes (Signed)
Pt refused to

## 2018-12-14 LAB — PATHOLOGIST SMEAR REVIEW

## 2018-12-15 DIAGNOSIS — D509 Iron deficiency anemia, unspecified: Secondary | ICD-10-CM | POA: Diagnosis not present

## 2018-12-15 DIAGNOSIS — N186 End stage renal disease: Secondary | ICD-10-CM | POA: Diagnosis not present

## 2018-12-15 DIAGNOSIS — N2581 Secondary hyperparathyroidism of renal origin: Secondary | ICD-10-CM | POA: Diagnosis not present

## 2018-12-15 DIAGNOSIS — D631 Anemia in chronic kidney disease: Secondary | ICD-10-CM | POA: Diagnosis not present

## 2018-12-15 DIAGNOSIS — Z23 Encounter for immunization: Secondary | ICD-10-CM | POA: Diagnosis not present

## 2018-12-17 ENCOUNTER — Emergency Department (HOSPITAL_COMMUNITY)
Admission: EM | Admit: 2018-12-17 | Discharge: 2018-12-17 | Disposition: A | Discharge status: Home / Self Care | Payer: Medicare Other | Attending: Emergency Medicine | Admitting: Emergency Medicine

## 2018-12-17 ENCOUNTER — Other Ambulatory Visit: Payer: Self-pay

## 2018-12-17 ENCOUNTER — Emergency Department (HOSPITAL_COMMUNITY): Payer: Medicare Other

## 2018-12-17 ENCOUNTER — Encounter (HOSPITAL_COMMUNITY): Payer: Self-pay | Admitting: Emergency Medicine

## 2018-12-17 DIAGNOSIS — F17210 Nicotine dependence, cigarettes, uncomplicated: Secondary | ICD-10-CM | POA: Diagnosis not present

## 2018-12-17 DIAGNOSIS — J449 Chronic obstructive pulmonary disease, unspecified: Secondary | ICD-10-CM | POA: Insufficient documentation

## 2018-12-17 DIAGNOSIS — R109 Unspecified abdominal pain: Secondary | ICD-10-CM

## 2018-12-17 DIAGNOSIS — R188 Other ascites: Secondary | ICD-10-CM | POA: Diagnosis not present

## 2018-12-17 DIAGNOSIS — I252 Old myocardial infarction: Secondary | ICD-10-CM | POA: Insufficient documentation

## 2018-12-17 DIAGNOSIS — N179 Acute kidney failure, unspecified: Secondary | ICD-10-CM | POA: Diagnosis not present

## 2018-12-17 DIAGNOSIS — Z7982 Long term (current) use of aspirin: Secondary | ICD-10-CM | POA: Insufficient documentation

## 2018-12-17 DIAGNOSIS — Z992 Dependence on renal dialysis: Secondary | ICD-10-CM | POA: Diagnosis not present

## 2018-12-17 DIAGNOSIS — I120 Hypertensive chronic kidney disease with stage 5 chronic kidney disease or end stage renal disease: Secondary | ICD-10-CM | POA: Diagnosis not present

## 2018-12-17 DIAGNOSIS — I2510 Atherosclerotic heart disease of native coronary artery without angina pectoris: Secondary | ICD-10-CM | POA: Insufficient documentation

## 2018-12-17 DIAGNOSIS — K7011 Alcoholic hepatitis with ascites: Secondary | ICD-10-CM | POA: Diagnosis not present

## 2018-12-17 DIAGNOSIS — K7031 Alcoholic cirrhosis of liver with ascites: Secondary | ICD-10-CM | POA: Diagnosis not present

## 2018-12-17 DIAGNOSIS — N186 End stage renal disease: Secondary | ICD-10-CM | POA: Insufficient documentation

## 2018-12-17 DIAGNOSIS — Z79899 Other long term (current) drug therapy: Secondary | ICD-10-CM | POA: Diagnosis not present

## 2018-12-17 DIAGNOSIS — R197 Diarrhea, unspecified: Secondary | ICD-10-CM | POA: Diagnosis not present

## 2018-12-17 LAB — COMPREHENSIVE METABOLIC PANEL
ALT: 19 U/L (ref 0–44)
AST: 36 U/L (ref 15–41)
Albumin: 3.7 g/dL (ref 3.5–5.0)
Alkaline Phosphatase: 105 U/L (ref 38–126)
Anion gap: 21 — ABNORMAL HIGH (ref 5–15)
BUN: 29 mg/dL — ABNORMAL HIGH (ref 6–20)
CO2: 25 mmol/L (ref 22–32)
Calcium: 9.4 mg/dL (ref 8.9–10.3)
Chloride: 86 mmol/L — ABNORMAL LOW (ref 98–111)
Creatinine, Ser: 10.25 mg/dL — ABNORMAL HIGH (ref 0.61–1.24)
GFR calc Af Amer: 6 mL/min — ABNORMAL LOW (ref >60)
GFR calc non Af Amer: 5 mL/min — ABNORMAL LOW (ref >60)
Glucose, Bld: 117 mg/dL — ABNORMAL HIGH (ref 70–99)
Potassium: 4.2 mmol/L (ref 3.5–5.1)
Sodium: 132 mmol/L — ABNORMAL LOW (ref 135–145)
Total Bilirubin: 1.3 mg/dL — ABNORMAL HIGH (ref 0.3–1.2)
Total Protein: 7.6 g/dL (ref 6.5–8.1)

## 2018-12-17 LAB — CBC WITH DIFFERENTIAL/PLATELET
Abs Immature Granulocytes: 0.05 10*3/uL (ref 0.00–0.07)
Basophils Absolute: 0.1 10*3/uL (ref 0.0–0.1)
Basophils Relative: 1 %
Eosinophils Absolute: 0.1 10*3/uL (ref 0.0–0.5)
Eosinophils Relative: 1 %
HCT: 31.6 % — ABNORMAL LOW (ref 39.0–52.0)
Hemoglobin: 11.3 g/dL — ABNORMAL LOW (ref 13.0–17.0)
Immature Granulocytes: 1 %
Lymphocytes Relative: 15 %
Lymphs Abs: 1.5 10*3/uL (ref 0.7–4.0)
MCH: 28.3 pg (ref 26.0–34.0)
MCHC: 35.8 g/dL (ref 30.0–36.0)
MCV: 79 fL — ABNORMAL LOW (ref 80.0–100.0)
Monocytes Absolute: 0.7 10*3/uL (ref 0.1–1.0)
Monocytes Relative: 7 %
Neutro Abs: 7.5 10*3/uL (ref 1.7–7.7)
Neutrophils Relative %: 75 %
Platelets: 131 10*3/uL — ABNORMAL LOW (ref 150–400)
RBC: 4 MIL/uL — ABNORMAL LOW (ref 4.22–5.81)
RDW: 16.2 % — ABNORMAL HIGH (ref 11.5–15.5)
WBC: 9.9 10*3/uL (ref 4.0–10.5)
nRBC: 0 % (ref 0.0–0.2)

## 2018-12-17 LAB — LIPASE, BLOOD: Lipase: 29 U/L (ref 11–51)

## 2018-12-17 MED ORDER — ONDANSETRON HCL 4 MG/2ML IJ SOLN
4.0000 mg | Freq: Once | INTRAMUSCULAR | Status: AC
  Administered 2018-12-17: 4 mg via INTRAVENOUS
  Filled 2018-12-17: qty 2

## 2018-12-17 MED ORDER — IOHEXOL 300 MG/ML  SOLN
100.0000 mL | Freq: Once | INTRAMUSCULAR | Status: AC | PRN
  Administered 2018-12-17: 100 mL via INTRAVENOUS

## 2018-12-17 MED ORDER — FENTANYL CITRATE (PF) 100 MCG/2ML IJ SOLN
50.0000 ug | Freq: Once | INTRAMUSCULAR | Status: AC
  Administered 2018-12-17: 50 ug via INTRAVENOUS
  Filled 2018-12-17: qty 2

## 2018-12-17 NOTE — ED Notes (Signed)
Pt reports having continuous abd pains for the last 2 weeks. Pt has been seen for same and symptoms are not resolved.

## 2018-12-17 NOTE — ED Triage Notes (Signed)
[name] [phone]

## 2018-12-17 NOTE — ED Notes (Signed)
Pt advised he does not make urine. Unable to obtain urine sample.

## 2018-12-17 NOTE — Discharge Instructions (Signed)
Please call Interventional Radiology [phone]) tomorrow to schedule a paracentesis procedure to help remove fluid from  your abdomen which is responsible for the pain that you are experiencing.  Also make sure to attend your regular dialysis session as scheduled.

## 2018-12-17 NOTE — ED Triage Notes (Signed)
P[. Stated, I had to stop dial;ysis on Friday cause my stomach hurt.

## 2018-12-17 NOTE — ED Triage Notes (Signed)
Pt. Stated, [name] had stomach pain with diarrhea and unable to eat for the last 2 days.

## 2018-12-17 NOTE — ED Provider Notes (Signed)
[facility] EMERGENCY DEPARTMENT Provider Note   CSN: [id number] Arrival date & time: 12/17/18  1212    History   Chief Complaint Chief Complaint  Patient presents with  . Abdominal Pain  . Diarrhea  . Anorexia    HPI [name] is a 58 y.o. male.     The history is provided by the patient and medical records. No language interpreter was used.  Abdominal Pain  Associated symptoms: diarrhea   Diarrhea  Associated symptoms: abdominal pain      58 year old male with history of liver cirrhosis secondary to hepatitis, COPD, GERD, end-stage renal disease currently on dialysis, presenting for evaluation of abdominal pain.  Patient states for the past 2 weeks he has had persistent pain in his abdomen.  He described pain as a sharp sensation constant, moderate to severe, with decrease in appetite.  States he feels nauseous does not want to eat.  He also endorsed having loose stools for the past 2 days.  He also did not finish his dialysis session 2 days ago by 2 hrs. he does not make urine.  He denies fever chills chest pain or shortness of breath.  He denies any productive cough, any recent sick contact with COVID-19 patient.  Patient mention it has been 4 days since his last has a paracentesis.    Past Medical History:  Diagnosis Date  . Anemia   . Anxiety   . Arthritis    left shoulder  . Atherosclerosis of aorta ([place])   . Cardiomegaly   . Chest pain    DATE UNKNOWN, C/O PERIODICALLY  . Cocaine abuse ([place])   . COPD exacerbation ([place]) 08/17/2016  . Coronary artery disease    stent 02/22/17  . ESRD (end stage renal disease) on dialysis ([place])    "[name]; MWF" (07/04/2017)  . GERD (gastroesophageal reflux disease)    DATE UNKNOWN  . Hemorrhoids   . Hepatitis B, chronic ([place])   . Hepatitis C   . History of kidney stones   . Hyperkalemia   . Hypertension   . Kidney failure   . Metabolic bone disease    Patient denies  . Mitral stenosis   .  Myocardial infarction ([place])   . Pneumonia   . Pulmonary edema   . Solitary rectal ulcer syndrome 07/2017   at [facility] for rectal bleeding  . Tubular adenoma of colon     Patient Active Problem List   Diagnosis Date Noted  . Sepsis ([place]) 09/12/2018  . Atherosclerosis of native coronary artery of native heart without angina pectoris 03/11/2018  . Benign neoplasm of cecum   . Benign neoplasm of ascending colon   . Benign neoplasm of descending colon   . Benign neoplasm of rectum   . Paroxysmal atrial fibrillation ([place]) 01/23/2018  . Hx of colonic polyps 01/20/2018  . ESRD (end stage renal disease) ([place]) 11/21/2017  . GERD (gastroesophageal reflux disease) 11/16/2017  . Liver cirrhosis ([place]) 11/15/2017  . DNR (do not resuscitate)   . Palliative care by specialist   . Hyponatremia 11/04/2017  . SBP (spontaneous bacterial peritonitis) ([place]) 10/30/2017  . Liver disease, chronic 10/30/2017  . SOB (shortness of breath)   . Abdominal pain 10/28/2017  . Upper airway cough syndrome with flattening on f/v loop 10/13/17 c/w vcd 10/17/2017  . Elevated diaphragm 10/13/2017  . Ileus ([place]) 09/29/2017  . Malnutrition of moderate degree 09/29/2017  . Sinus congestion 09/03/2017  . Symptomatic anemia 09/02/2017  .  Other cirrhosis of liver ([place]) 09/02/2017  . Left bundle branch block 09/02/2017  . Mitral stenosis 09/02/2017  . Hematochezia 07/15/2017  . Wide-complex tachycardia ([place])   . Endotracheally intubated   . ESRD on dialysis ([place]) 07/04/2017  . Acute respiratory failure with hypoxia ([place]) 06/18/2017  . CKD (chronic kidney disease) stage V requiring chronic dialysis ([place]) 06/18/2017  . History of Cocaine abuse ([place]) 06/18/2017  . Hypertension 06/18/2017  . Infection of AV graft for dialysis ([place]) 06/18/2017  . Anxiety 06/18/2017  . Anemia due to chronic kidney disease 06/18/2017  . Atrial flutter with rapid ventricular response ([place]) 06/18/2017  . Personality disorder ([place])  06/13/2017  . Cellulitis 06/12/2017  . Adjustment disorder with mixed anxiety and depressed mood 06/10/2017  . Suicidal ideation 06/10/2017  . Arm wound, left, sequela 06/10/2017  . Dyspnea on exertion 05/29/2017  . Tachycardia 05/29/2017  . Hyperkalemia 05/22/2017  . Acute metabolic encephalopathy   . Anemia 04/23/2017  . Ascites 04/23/2017  . COPD (chronic obstructive pulmonary disease) ([place]) 04/23/2017  . Acute on chronic respiratory failure with hypoxia ([place]) 03/25/2017  . Arrhythmia 03/25/2017  . COPD GOLD 0 with flattening on inps f/v  09/27/2016  . Essential hypertension 09/27/2016  . Fluid overload 08/30/2016  . COPD exacerbation ([place]) 08/17/2016  . Hypertensive urgency 08/17/2016  . Respiratory failure ([place]) 08/17/2016  . Problem with dialysis access ([place]) 07/23/2016  . Chronic hepatitis B ([place]) 03/05/2014  . Chronic hepatitis C without hepatic coma ([place]) 03/05/2014  . Internal hemorrhoids with bleeding, swelling and itching 03/05/2014  . Thrombocytopenia ([place]) 03/05/2014  . Chest pain 02/27/2014  . Alcohol abuse 04/14/2009  . Cigarette smoker 04/14/2009  . GANGLION CYST 04/14/2009    Past Surgical History:  Procedure Laterality Date  . A/V FISTULAGRAM Left 05/26/2017   Procedure: A/V FISTULAGRAM;  Surgeon: Conrad Akron, MD;  Location: [facility];  Service: Cardiovascular;  Laterality: Left;  . A/V FISTULAGRAM Right 11/18/2017   Procedure: A/V FISTULAGRAM - Right Arm;  Surgeon: Elam Dutch, MD;  Location: [facility];  Service: Cardiovascular;  Laterality: Right;  . APPLICATION OF WOUND VAC Left 06/14/2017   Procedure: APPLICATION OF WOUND VAC;  Surgeon: Katha Cabal, MD;  Location: [facility];  Service: Vascular;  Laterality: Left;  . AV FISTULA PLACEMENT  2012   BELIEVED WAS PLACED IN JUNE  . AV FISTULA PLACEMENT Right 08/09/2017   Procedure: Creation Right arm ARTERIOVENOUS BRACHIOCEPOHALIC FISTULA;  Surgeon: Elam Dutch, MD;   Location: [facility];  Service: Vascular;  Laterality: Right;  . AV FISTULA PLACEMENT Right 11/22/2017   Procedure: INSERTION OF ARTERIOVENOUS (AV) GORE-TEX GRAFT RIGHT UPPER ARM;  Surgeon: Elam Dutch, MD;  Location: [facility];  Service: Vascular;  Laterality: Right;  . BIOPSY  01/25/2018   Procedure: BIOPSY;  Surgeon: Jerene Bears, MD;  Location: [facility];  Service: Gastroenterology;;  . COLONOSCOPY    . COLONOSCOPY WITH PROPOFOL N/A 01/25/2018   Procedure: COLONOSCOPY WITH PROPOFOL;  Surgeon: Jerene Bears, MD;  Location: [facility];  Service: Gastroenterology;  Laterality: N/A;  . CORONARY STENT INTERVENTION N/A 02/22/2017   Procedure: CORONARY STENT INTERVENTION;  Surgeon: Nigel Mormon, MD;  Location: [facility];  Service: Cardiovascular;  Laterality: N/A;  . ESOPHAGOGASTRODUODENOSCOPY (EGD) WITH PROPOFOL N/A 01/25/2018   Procedure: ESOPHAGOGASTRODUODENOSCOPY (EGD) WITH PROPOFOL;  Surgeon: Jerene Bears, MD;  Location: [facility];  Service: Gastroenterology;  Laterality: N/A;  . FLEXIBLE SIGMOIDOSCOPY N/A 07/15/2017  Procedure: FLEXIBLE SIGMOIDOSCOPY;  Surgeon: Carol Ada, MD;  Location: [facility];  Service: Endoscopy;  Laterality: N/A;  . HEMORRHOID BANDING    . I&D EXTREMITY Left 06/01/2017   Procedure: IRRIGATION AND DEBRIDEMENT LEFT ARM HEMATOMA WITH LIGATION OF LEFT ARM AV FISTULA;  Surgeon: Elam Dutch, MD;  Location: [facility];  Service: Vascular;  Laterality: Left;  . I&D EXTREMITY Left 06/14/2017   Procedure: IRRIGATION AND DEBRIDEMENT EXTREMITY;  Surgeon: Katha Cabal, MD;  Location: [facility];  Service: Vascular;  Laterality: Left;  . INSERTION OF DIALYSIS CATHETER  05/30/2017  . INSERTION OF DIALYSIS CATHETER N/A 05/30/2017   Procedure: INSERTION OF DIALYSIS CATHETER;  Surgeon: Elam Dutch, MD;  Location: [facility];  Service: Vascular;  Laterality: N/A;  . IR PARACENTESIS  08/30/2017  . IR PARACENTESIS  09/29/2017  . IR PARACENTESIS  10/28/2017  .  IR PARACENTESIS  11/09/2017  . IR PARACENTESIS  11/16/2017  . IR PARACENTESIS  11/28/2017  . IR PARACENTESIS  12/01/2017  . IR PARACENTESIS  12/06/2017  . IR PARACENTESIS  01/03/2018  . IR PARACENTESIS  01/23/2018  . IR PARACENTESIS  02/07/2018  . IR PARACENTESIS  02/21/2018  . IR PARACENTESIS  03/06/2018  . IR PARACENTESIS  03/17/2018  . IR PARACENTESIS  04/04/2018  . IR RADIOLOGIST EVAL & MGMT  02/14/2018  . LEFT HEART CATH AND CORONARY ANGIOGRAPHY N/A 02/22/2017   Procedure: LEFT HEART CATH AND CORONARY ANGIOGRAPHY;  Surgeon: Nigel Mormon, MD;  Location: [facility];  Service: Cardiovascular;  Laterality: N/A;  . LIGATION OF ARTERIOVENOUS  FISTULA Left 07/17/1094   Procedure: Plication of Left Arm Arteriovenous Fistula;  Surgeon: Elam Dutch, MD;  Location: [facility];  Service: Vascular;  Laterality: Left;  . POLYPECTOMY    . POLYPECTOMY  01/25/2018   Procedure: POLYPECTOMY;  Surgeon: Jerene Bears, MD;  Location: [facility];  Service: Gastroenterology;;  . REVISON OF ARTERIOVENOUS FISTULA Left 0/45/4098   Procedure: PLICATION OF DISTAL ANEURYSMAL SEGEMENT OF LEFT UPPER ARM ARTERIOVENOUS FISTULA;  Surgeon: Elam Dutch, MD;  Location: [facility];  Service: Vascular;  Laterality: Left;  . REVISON OF ARTERIOVENOUS FISTULA Left 07/30/1476   Procedure: Plication of Left Upper Arm Fistula ;  Surgeon: Waynetta Sandy, MD;  Location: [facility];  Service: Vascular;  Laterality: Left;  . SKIN GRAFT SPLIT THICKNESS LEG / FOOT Left    SKIN GRAFT SPLIT THICKNESS LEFT ARM DONOR SITE: LEFT ANTERIOR THIGH  . SKIN SPLIT GRAFT Left 07/04/2017   Procedure: SKIN GRAFT SPLIT THICKNESS LEFT ARM DONOR SITE: LEFT ANTERIOR THIGH;  Surgeon: Elam Dutch, MD;  Location: [facility];  Service: Vascular;  Laterality: Left;  . THROMBECTOMY W/ EMBOLECTOMY Left 06/05/2017   Procedure: EXPLORATION OF LEFT ARM FOR BLEEDING; OVERSEWED PROXIMAL FISTULA;  Surgeon: Angelia Mould, MD;  Location: [facility];  Service:  Vascular;  Laterality: Left;  . WOUND EXPLORATION Left 06/03/2017   Procedure: WOUND EXPLORATION WITH WOUND VAC APPLICATION TO LEFT ARM;  Surgeon: Angelia Mould, MD;  Location: [facility];  Service: Vascular;  Laterality: Left;        Home Medications    Prior to Admission medications   Medication Sig Start Date End Date Taking? Authorizing Provider  albuterol (VENTOLIN HFA) 108 (90 Base) MCG/ACT inhaler Inhale 2 puffs into the lungs every 6 (six) hours as needed for wheezing or shortness of breath.    [provider]  aspirin 81 MG EC tablet Take 1 tablet (  81 mg total) by mouth daily. 03/12/18   Geradine Girt, DO  bisacodyl (DULCOLAX) 5 MG EC tablet Take 5 mg by mouth daily as needed for moderate constipation.    [provider]  hydrALAZINE (APRESOLINE) 100 MG tablet Take 1 tablet (100 mg total) by mouth 2 (two) times daily as needed. Taking after HD on Dialysis days Patient taking differently: Take 100 mg by mouth 2 (two) times daily as needed (high blood pressure). If needed on Monday, Wednesday, Friday take after dialysis 03/11/18   Geradine Girt, DO  lactulose (CHRONULAC) 10 GM/15ML solution Take 45 mLs (30 g total) by mouth 2 (two) times daily. 12/10/18   Alroy Bailiff, Margaux, PA-C  metoprolol tartrate (LOPRESSOR) 25 MG tablet Take 1 tablet (25 mg total) by mouth 2 (two) times daily. 03/11/18   Geradine Girt, DO  pantoprazole (PROTONIX) 40 MG tablet Take 1 tablet (40 mg total) by mouth daily at 12 noon. Patient taking differently: Take 40 mg by mouth See admin instructions. Take one tablet (40 mg) by mouth in the morning on Sunday, Tuesday, Thursday, Saturday, take one tablet (40 mg) after dialysis on Monday, Wednesday, Friday. May also take one tablet (40 mg) in the evening as needed for acid reflux. 03/12/18   Geradine Girt, DO  sevelamer carbonate (RENVELA) 800 MG tablet Take 4 tablets with meals  And 2 tablets twice daily with snacks Patient taking differently: Take  2,400-3,200 mg by mouth See admin instructions. Take 3 - 4 tablets (2400 - 3200 mg) up to five times daily with meals or snacks 01/22/18   Patrecia Pour, MD  SYMBICORT 160-4.5 MCG/ACT inhaler Inhale 2 puffs into the lungs 2 (two) times a day. 11/30/18   [provider]  zolpidem (AMBIEN) 5 MG tablet Take 5 mg by mouth at bedtime. 12/08/18   [provider]    Family History Family History  Problem Relation Age of Onset  . Heart disease Mother   . Lung cancer Mother   . Heart disease Father   . Malignant hyperthermia Father   . COPD Father   . Throat cancer Sister   . Esophageal cancer Sister   . Hypertension Other   . COPD Other   . Colon cancer Neg Hx   . Colon polyps Neg Hx   . Rectal cancer Neg Hx   . Stomach cancer Neg Hx     Social History Social History   Tobacco Use  . Smoking status: Current Every Day Smoker    Packs/day: 0.50    Years: 43.00    Pack years: 21.50    Types: Cigarettes    Start date: 08/13/1973  . Smokeless tobacco: Never Used  Substance Use Topics  . Alcohol use: Not Currently    Frequency: Never    Comment: quit drinking in 2017  . Drug use: Yes    Types: Marijuana     Allergies   Morphine and related; Aspirin; Clonidine derivatives; Tramadol; and Tylenol [acetaminophen]   Review of Systems Review of Systems  Gastrointestinal: Positive for abdominal pain and diarrhea.  All other systems reviewed and are negative.    Physical Exam Updated Vital Signs BP (!) 174/75   Pulse 65   Temp 98.1 F (36.7 C) (Oral)   Resp 17   SpO2 98%   Physical Exam Vitals signs and nursing note reviewed.  Constitutional:      General: He is not in acute distress.    Appearance: He is  well-developed.  HENT:     Head: Atraumatic.  Eyes:     Conjunctiva/sclera: Conjunctivae normal.  Neck:     Musculoskeletal: Neck supple.  Cardiovascular:     Rate and Rhythm: Normal rate and regular rhythm.  Pulmonary:     Effort: Pulmonary effort  is normal.     Breath sounds: Normal breath sounds. No rales.  Abdominal:     General: Bowel sounds are normal. There is distension.     Palpations: There is fluid wave. There is no pulsatile mass.     Tenderness: There is generalized abdominal tenderness.  Skin:    Findings: No rash.  Neurological:     Mental Status: He is alert.      ED Treatments / Results  Labs (all labs ordered are listed, but only abnormal results are displayed) Labs Reviewed  CBC WITH DIFFERENTIAL/PLATELET - Abnormal; Notable for the following components:      Result Value   RBC 4.00 (*)    Hemoglobin 11.3 (*)    HCT 31.6 (*)    MCV 79.0 (*)    RDW 16.2 (*)    Platelets 131 (*)    All other components within normal limits  COMPREHENSIVE METABOLIC PANEL - Abnormal; Notable for the following components:   Sodium 132 (*)    Chloride 86 (*)    Glucose, Bld 117 (*)    BUN 29 (*)    Creatinine, Ser 10.25 (*)    Total Bilirubin 1.3 (*)    GFR calc non Af Amer 5 (*)    GFR calc Af Amer 6 (*)    Anion gap 21 (*)    All other components within normal limits  LIPASE, BLOOD    EKG None  Radiology Ct Abdomen Pelvis W Contrast  Result Date: 12/17/2018 CLINICAL DATA:  Abdominal pain and diarrhea.  Chronic renal failure EXAM: CT ABDOMEN AND PELVIS WITH CONTRAST TECHNIQUE: Multidetector CT imaging of the abdomen and pelvis was performed using the standard protocol following bolus administration of intravenous contrast. CONTRAST:  163mL OMNIPAQUE IOHEXOL 300 MG/ML  SOLN COMPARISON:  August 03, 2018 FINDINGS: Lower chest: There is atelectatic change in the left base. Visualized lung bases otherwise are clear. There is cardiomegaly. There is a degree of left ventricular hypertrophy. There is calcification in the mitral valve. Hepatobiliary: The liver has a subtly nodular contour, consistent with cirrhosis. There is generalized decreased in liver attenuation, potentially with underlying hepatic steatosis. No focal  liver lesions are evident. The portal and hepatic veins appear patent. Gallbladder is contracted there is no appreciable biliary duct dilatation. Pancreas: There is no pancreatic mass or inflammatory focus. Spleen: The spleen is normal in size and contour. No splenic lesions are evident. Adrenals/Urinary Tract: Adrenals bilaterally appear unremarkable. Native kidneys are atrophic bilaterally. There is extensive vascular calcification in both main renal arteries as well as in peripheral renal arterial branches. There is an approximately 1 x 1 cm cyst arising from the medial upper pole left kidney. There is no hydronephrosis on either side. There is no evident renal or ureteral calculus on either side. Urinary bladder is virtually empty. There is no appreciable urinary bladder wall thickening given virtually empty state. Stomach/Bowel: There is no appreciable bowel wall or mesenteric thickening. A clip is noted in the rectum. There is no evident bowel obstruction. There is no free air or portal venous air. Vascular/Lymphatic: There is extensive aortic and mesenteric arterial vascular calcification. There is extensive generalized pelvic arterial vascular calcification. Penile  artery calcification bilaterally noted. No adenopathy is evident in the abdomen or pelvis. Reproductive: Prostate and seminal vesicles appear normal in size and contour. There is mild prostatic calcification. Other: There is marked ascites throughout the abdomen and pelvis. No abscess evident in the abdomen or pelvis. There is no periappendiceal region inflammatory change. Musculoskeletal: Bones are diffusely sclerotic, consistent with secondary hyperparathyroidism, a consequence of chronic renal failure. No destructive bony changes evident. There is no evident intramuscular lesion. No abdominal wall lesions evident. IMPRESSION: 1.  Evidence of hepatic cirrhosis.  No focal liver lesions evident. 2.  Extensive ascites throughout the abdomen and  pelvis. 3. No evident bowel obstruction. No evident abscess in the abdomen or pelvis. No inflammatory foci evident. 4. Atrophic kidneys without hydronephrosis. No renal or ureteral calculi. 5.  Marked diffuse arterial vascular calcification. 6. Diffuse bony sclerosis, a consequence of chronic secondary hyperparathyroidism. Electronically Signed   By: Lowella Grip III M.D.   On: 12/17/2018 13:58    Procedures Procedures (including critical care time)  Medications Ordered in ED Medications  fentaNYL (SUBLIMAZE) injection 50 mcg (has no administration in time range)  fentaNYL (SUBLIMAZE) injection 50 mcg (50 mcg Intravenous Given 12/17/18 1330)  ondansetron (ZOFRAN) injection 4 mg (4 mg Intravenous Given 12/17/18 1330)  iohexol (OMNIPAQUE) 300 MG/ML solution 100 mL (100 mLs Intravenous Contrast Given 12/17/18 1306)     Initial Impression / Assessment and Plan / ED Course  I have reviewed the triage vital signs and the nursing notes.  Pertinent labs & imaging results that were available during my care of the patient were reviewed by me and considered in my medical decision making (see chart for details).        BP (!) 170/101   Pulse 71   Temp 98.1 F (36.7 C) (Oral)   Resp 18   SpO2 96%    Final Clinical Impressions(s) / ED Diagnoses   Final diagnoses:  Abdominal pain  Ascites due to alcoholic hepatitis    ED Discharge Orders    None     2:44 PM Patient endorsed persistent abdominal discomfort for the past 2 weeks.  His pain is likely secondary to his ascites.  He was seen 4 days ago for same and did have 2.7 L of fluid drawn off via IR paracentesis.  He did report mild improvement but still endorse abdominal distention.  Does not have any fever.  Abdomen is mildly tender diffusely but I have low suspicion for spontaneous bacteria peritonitis.  Labs are reassuring.  He did miss 2 hours of dialysis session on Friday which is 2 days ago and his next dialysis is tomorrow.  I  did reach out to interventional radiology to request for therapeutic paracentesis however no provider available at this time.  Since this is not an emergency, plan to have patient call interventional radiology tomorrow to have a close follow-up therapeutic paracentesis after his dialysis session.  Will provide appropriate number to call and return precaution discussed.  Care discussed with Dr. Francia Greaves.   Domenic Moras, PA-C 12/17/18 2049    Valarie Merino, MD 12/19/18 [phone]

## 2018-12-18 ENCOUNTER — Other Ambulatory Visit (HOSPITAL_COMMUNITY): Payer: Self-pay | Admitting: Emergency Medicine

## 2018-12-18 ENCOUNTER — Other Ambulatory Visit: Payer: Self-pay

## 2018-12-18 ENCOUNTER — Ambulatory Visit (HOSPITAL_COMMUNITY)
Admission: RE | Admit: 2018-12-18 | Discharge: 2018-12-18 | Disposition: A | Discharge status: Home / Self Care | Payer: Medicare Other | Source: Ambulatory Visit | Attending: Emergency Medicine | Admitting: Emergency Medicine

## 2018-12-18 DIAGNOSIS — R188 Other ascites: Secondary | ICD-10-CM

## 2018-12-18 DIAGNOSIS — N2581 Secondary hyperparathyroidism of renal origin: Secondary | ICD-10-CM | POA: Diagnosis not present

## 2018-12-18 DIAGNOSIS — Z23 Encounter for immunization: Secondary | ICD-10-CM | POA: Diagnosis not present

## 2018-12-18 DIAGNOSIS — D509 Iron deficiency anemia, unspecified: Secondary | ICD-10-CM | POA: Diagnosis not present

## 2018-12-18 DIAGNOSIS — D631 Anemia in chronic kidney disease: Secondary | ICD-10-CM | POA: Diagnosis not present

## 2018-12-18 DIAGNOSIS — N186 End stage renal disease: Secondary | ICD-10-CM | POA: Diagnosis not present

## 2018-12-18 LAB — CULTURE, BODY FLUID W GRAM STAIN -BOTTLE: Culture: NO GROWTH

## 2018-12-18 IMAGING — DX DG CHEST 1V PORT
1 series · 1 of 1 positions shown · non-contrast
Comparison: 05/30/2017

CLINICAL DATA: 56-year-old male with shortness of breath.

EXAM:
PORTABLE CHEST 1 VIEW

[chest ap]
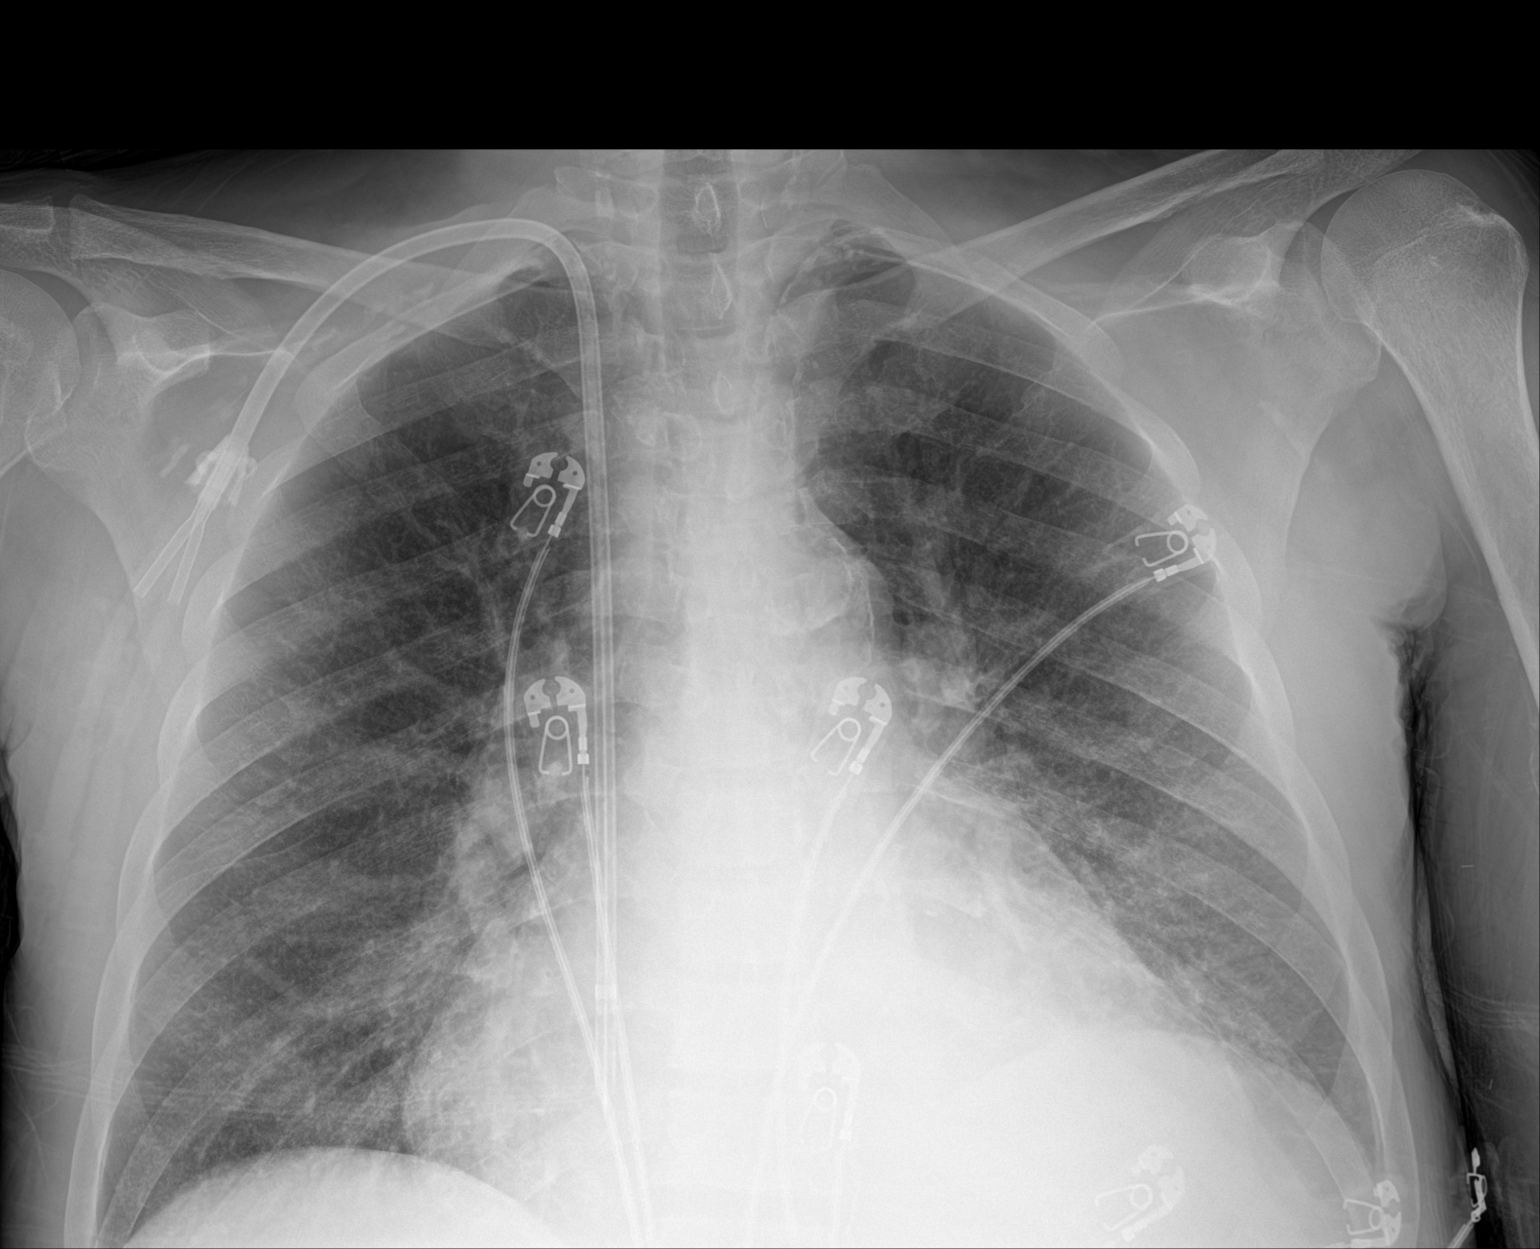

[1 of 1 positions shown; findings below may reference images not displayed]

FINDINGS: Right-sided IJ central venous catheter appears in stable condition.
The cardiomediastinal silhouette is enlarged but unchanged. There is
pulmonary vascular congestion.

Stable elevation of the left hemidiaphragm with associated
atelectasis. Early developing infiltrate in this region is difficult
to entirely exclude. The right lung is clear. No sizable pleural
effusions or pneumothorax.

No acute osseous abnormalities.
IMPRESSION: Stable cardiomegaly with left basilar atelectasis versus developing
infiltrate.

## 2018-12-18 NOTE — Progress Notes (Signed)
IR requested by Dr. Melina Copa for possible image-guided paracentesis.  Limited abdominal ultrasound revealed little to no fluid that could safely be accessed with procedure today. Images sent to Dr. Annamaria Boots for review. Informed patient that procedure will not occur today. All questions answered and concerns addressed.  IR available in future if needed.   Bea Graff Marene Gilliam, PA-C 12/18/2018, 2:43 PM

## 2018-12-19 DIAGNOSIS — Z79899 Other long term (current) drug therapy: Secondary | ICD-10-CM | POA: Diagnosis not present

## 2018-12-19 DIAGNOSIS — N186 End stage renal disease: Secondary | ICD-10-CM | POA: Diagnosis not present

## 2018-12-20 DIAGNOSIS — Z23 Encounter for immunization: Secondary | ICD-10-CM | POA: Diagnosis not present

## 2018-12-20 DIAGNOSIS — N2581 Secondary hyperparathyroidism of renal origin: Secondary | ICD-10-CM | POA: Diagnosis not present

## 2018-12-20 DIAGNOSIS — N186 End stage renal disease: Secondary | ICD-10-CM | POA: Diagnosis not present

## 2018-12-20 DIAGNOSIS — D509 Iron deficiency anemia, unspecified: Secondary | ICD-10-CM | POA: Diagnosis not present

## 2018-12-20 DIAGNOSIS — D631 Anemia in chronic kidney disease: Secondary | ICD-10-CM | POA: Diagnosis not present

## 2018-12-22 DIAGNOSIS — D631 Anemia in chronic kidney disease: Secondary | ICD-10-CM | POA: Diagnosis not present

## 2018-12-22 DIAGNOSIS — D509 Iron deficiency anemia, unspecified: Secondary | ICD-10-CM | POA: Diagnosis not present

## 2018-12-22 DIAGNOSIS — N2581 Secondary hyperparathyroidism of renal origin: Secondary | ICD-10-CM | POA: Diagnosis not present

## 2018-12-22 DIAGNOSIS — Z23 Encounter for immunization: Secondary | ICD-10-CM | POA: Diagnosis not present

## 2018-12-22 DIAGNOSIS — N186 End stage renal disease: Secondary | ICD-10-CM | POA: Diagnosis not present

## 2018-12-25 ENCOUNTER — Telehealth: Payer: Self-pay

## 2018-12-25 DIAGNOSIS — Z23 Encounter for immunization: Secondary | ICD-10-CM | POA: Diagnosis not present

## 2018-12-25 DIAGNOSIS — N2581 Secondary hyperparathyroidism of renal origin: Secondary | ICD-10-CM | POA: Diagnosis not present

## 2018-12-25 DIAGNOSIS — D509 Iron deficiency anemia, unspecified: Secondary | ICD-10-CM | POA: Diagnosis not present

## 2018-12-25 DIAGNOSIS — N186 End stage renal disease: Secondary | ICD-10-CM | POA: Diagnosis not present

## 2018-12-25 DIAGNOSIS — D631 Anemia in chronic kidney disease: Secondary | ICD-10-CM | POA: Diagnosis not present

## 2018-12-25 NOTE — Telephone Encounter (Signed)
Covid-19 screening questions   Do you now or have you had a fever in the last 14 days? no  Do you have any respiratory symptoms of shortness of breath or cough now or in the last 14 days? Pt states he stays SOB.  Do you have any family members or close contacts with diagnosed or suspected Covid-19 in the past 14 days? no  Have you been tested for Covid-19 and found to be positive?no

## 2018-12-26 ENCOUNTER — Ambulatory Visit (INDEPENDENT_AMBULATORY_CARE_PROVIDER_SITE_OTHER): Payer: Medicare Other | Admitting: Physician Assistant

## 2018-12-26 ENCOUNTER — Encounter: Payer: Self-pay | Admitting: Physician Assistant

## 2018-12-26 ENCOUNTER — Other Ambulatory Visit (INDEPENDENT_AMBULATORY_CARE_PROVIDER_SITE_OTHER): Payer: Medicare Other

## 2018-12-26 VITALS — BP 128/58 | HR 81 | Temp 97.7°F | Ht 69.0 in | Wt 160.0 lb

## 2018-12-26 DIAGNOSIS — R109 Unspecified abdominal pain: Secondary | ICD-10-CM

## 2018-12-26 DIAGNOSIS — F17210 Nicotine dependence, cigarettes, uncomplicated: Secondary | ICD-10-CM | POA: Diagnosis not present

## 2018-12-26 DIAGNOSIS — I2510 Atherosclerotic heart disease of native coronary artery without angina pectoris: Secondary | ICD-10-CM | POA: Diagnosis not present

## 2018-12-26 DIAGNOSIS — Z8619 Personal history of other infectious and parasitic diseases: Secondary | ICD-10-CM

## 2018-12-26 DIAGNOSIS — R188 Other ascites: Secondary | ICD-10-CM

## 2018-12-26 DIAGNOSIS — K7460 Unspecified cirrhosis of liver: Secondary | ICD-10-CM | POA: Diagnosis not present

## 2018-12-26 DIAGNOSIS — G8929 Other chronic pain: Secondary | ICD-10-CM

## 2018-12-26 LAB — PROTIME-INR
INR: 1.2 ratio — ABNORMAL HIGH (ref 0.8–1.0)
Prothrombin Time: 13.9 s — ABNORMAL HIGH (ref 9.6–13.1)

## 2018-12-26 MED ORDER — SULFAMETHOXAZOLE-TRIMETHOPRIM 800-160 MG PO TABS: 1.0000 | ORAL_TABLET | Freq: Two times a day (BID) | ORAL | 11 refills | Status: DC

## 2018-12-26 MED ORDER — LACTULOSE 10 GM/15ML PO SOLN: 30.0000 g | Freq: Two times a day (BID) | ORAL | 0 refills | Status: DC

## 2018-12-26 MED ORDER — PANTOPRAZOLE SODIUM 40 MG PO TBEC: 40.0000 mg | DELAYED_RELEASE_TABLET | ORAL | 11 refills | Status: DC

## 2018-12-26 NOTE — Patient Instructions (Addendum)
If you are age 56 or older, your body mass index should be between 23-30. Your Body mass index is 23.63 kg/m. If this is out of the aforementioned range listed, please consider follow up with your Primary Care Provider.  If you are age 66 or younger, your body mass index should be between 19-25. Your Body mass index is 23.63 kg/m. If this is out of the aformentioned range listed, please consider follow up with your Primary Care Provider.   Your provider has requested that you go to the basement level for lab work before leaving today. Press "B" on the elevator. The lab is located at the first door on the left as you exit the elevator.  We have sent the following medications to your pharmacy for you to pick up at your convenience: Lactulose Protonix Bactrim DS  You have been scheduled for IR paracentesis at [facility] on 12/28/18 at 2 pm.  Follow up with Dr. Hilarie Fredrickson on 02/06/19 at 10:30 am.  Thank you for choosing me and [facility].  Amy Esterwood, PA-C

## 2018-12-27 ENCOUNTER — Inpatient Hospital Stay (HOSPITAL_COMMUNITY)
Admission: EM | Admit: 2018-12-27 | Discharge: 2018-12-30 | DRG: 640 | Disposition: A | Discharge status: Home / Self Care | Payer: Medicare Other | Attending: Internal Medicine | Admitting: Internal Medicine

## 2018-12-27 ENCOUNTER — Emergency Department (HOSPITAL_COMMUNITY): Payer: Medicare Other

## 2018-12-27 ENCOUNTER — Encounter (HOSPITAL_COMMUNITY): Payer: Self-pay | Admitting: Emergency Medicine

## 2018-12-27 ENCOUNTER — Encounter: Payer: Self-pay | Admitting: Physician Assistant

## 2018-12-27 DIAGNOSIS — K7290 Hepatic failure, unspecified without coma: Secondary | ICD-10-CM | POA: Diagnosis present

## 2018-12-27 DIAGNOSIS — D509 Iron deficiency anemia, unspecified: Secondary | ICD-10-CM | POA: Diagnosis not present

## 2018-12-27 DIAGNOSIS — B181 Chronic viral hepatitis B without delta-agent: Secondary | ICD-10-CM | POA: Diagnosis not present

## 2018-12-27 DIAGNOSIS — K7031 Alcoholic cirrhosis of liver with ascites: Secondary | ICD-10-CM | POA: Diagnosis present

## 2018-12-27 DIAGNOSIS — Z1159 Encounter for screening for other viral diseases: Secondary | ICD-10-CM

## 2018-12-27 DIAGNOSIS — I2510 Atherosclerotic heart disease of native coronary artery without angina pectoris: Secondary | ICD-10-CM | POA: Diagnosis present

## 2018-12-27 DIAGNOSIS — R0600 Dyspnea, unspecified: Secondary | ICD-10-CM | POA: Diagnosis not present

## 2018-12-27 DIAGNOSIS — E8889 Other specified metabolic disorders: Secondary | ICD-10-CM | POA: Diagnosis present

## 2018-12-27 DIAGNOSIS — Z7951 Long term (current) use of inhaled steroids: Secondary | ICD-10-CM

## 2018-12-27 DIAGNOSIS — R0602 Shortness of breath: Secondary | ICD-10-CM | POA: Diagnosis not present

## 2018-12-27 DIAGNOSIS — F17210 Nicotine dependence, cigarettes, uncomplicated: Secondary | ICD-10-CM | POA: Diagnosis present

## 2018-12-27 DIAGNOSIS — Z9115 Patient's noncompliance with renal dialysis: Secondary | ICD-10-CM

## 2018-12-27 DIAGNOSIS — N186 End stage renal disease: Secondary | ICD-10-CM | POA: Diagnosis present

## 2018-12-27 DIAGNOSIS — E8770 Fluid overload, unspecified: Principal | ICD-10-CM | POA: Diagnosis present

## 2018-12-27 DIAGNOSIS — K7460 Unspecified cirrhosis of liver: Secondary | ICD-10-CM

## 2018-12-27 DIAGNOSIS — Z23 Encounter for immunization: Secondary | ICD-10-CM | POA: Diagnosis not present

## 2018-12-27 DIAGNOSIS — N2581 Secondary hyperparathyroidism of renal origin: Secondary | ICD-10-CM | POA: Diagnosis not present

## 2018-12-27 DIAGNOSIS — Z992 Dependence on renal dialysis: Secondary | ICD-10-CM

## 2018-12-27 DIAGNOSIS — D631 Anemia in chronic kidney disease: Secondary | ICD-10-CM | POA: Diagnosis not present

## 2018-12-27 DIAGNOSIS — I120 Hypertensive chronic kidney disease with stage 5 chronic kidney disease or end stage renal disease: Secondary | ICD-10-CM | POA: Diagnosis not present

## 2018-12-27 DIAGNOSIS — Z66 Do not resuscitate: Secondary | ICD-10-CM | POA: Diagnosis present

## 2018-12-27 DIAGNOSIS — E875 Hyperkalemia: Secondary | ICD-10-CM | POA: Diagnosis not present

## 2018-12-27 DIAGNOSIS — I248 Other forms of acute ischemic heart disease: Secondary | ICD-10-CM | POA: Diagnosis not present

## 2018-12-27 DIAGNOSIS — R188 Other ascites: Secondary | ICD-10-CM | POA: Diagnosis present

## 2018-12-27 DIAGNOSIS — I252 Old myocardial infarction: Secondary | ICD-10-CM

## 2018-12-27 DIAGNOSIS — Z955 Presence of coronary angioplasty implant and graft: Secondary | ICD-10-CM

## 2018-12-27 DIAGNOSIS — Z7982 Long term (current) use of aspirin: Secondary | ICD-10-CM

## 2018-12-27 DIAGNOSIS — B1920 Unspecified viral hepatitis C without hepatic coma: Secondary | ICD-10-CM | POA: Diagnosis present

## 2018-12-27 DIAGNOSIS — Z79899 Other long term (current) drug therapy: Secondary | ICD-10-CM

## 2018-12-27 DIAGNOSIS — Z9119 Patient's noncompliance with other medical treatment and regimen: Secondary | ICD-10-CM

## 2018-12-27 DIAGNOSIS — J449 Chronic obstructive pulmonary disease, unspecified: Secondary | ICD-10-CM | POA: Diagnosis present

## 2018-12-27 DIAGNOSIS — D6959 Other secondary thrombocytopenia: Secondary | ICD-10-CM | POA: Diagnosis present

## 2018-12-27 DIAGNOSIS — I480 Paroxysmal atrial fibrillation: Secondary | ICD-10-CM | POA: Diagnosis present

## 2018-12-27 LAB — BASIC METABOLIC PANEL
Anion gap: 17 — ABNORMAL HIGH (ref 5–15)
BUN: 35 mg/dL — ABNORMAL HIGH (ref 6–20)
CO2: 22 mmol/L (ref 22–32)
Calcium: 9.8 mg/dL (ref 8.9–10.3)
Chloride: 97 mmol/L — ABNORMAL LOW (ref 98–111)
Creatinine, Ser: 8.18 mg/dL — ABNORMAL HIGH (ref 0.61–1.24)
GFR calc Af Amer: 8 mL/min — ABNORMAL LOW (ref >60)
GFR calc non Af Amer: 7 mL/min — ABNORMAL LOW (ref >60)
Glucose, Bld: 83 mg/dL (ref 70–99)
Potassium: 6 mmol/L — ABNORMAL HIGH (ref 3.5–5.1)
Sodium: 136 mmol/L (ref 135–145)

## 2018-12-27 LAB — AFP TUMOR MARKER: AFP-Tumor Marker: 1.6 ng/mL (ref <6.1)

## 2018-12-27 LAB — CBC
HCT: 32.4 % — ABNORMAL LOW (ref 39.0–52.0)
Hemoglobin: 11.3 g/dL — ABNORMAL LOW (ref 13.0–17.0)
MCH: 28 pg (ref 26.0–34.0)
MCHC: 34.9 g/dL (ref 30.0–36.0)
MCV: 80.4 fL (ref 80.0–100.0)
Platelets: 114 10*3/uL — ABNORMAL LOW (ref 150–400)
RBC: 4.03 MIL/uL — ABNORMAL LOW (ref 4.22–5.81)
RDW: 16 % — ABNORMAL HIGH (ref 11.5–15.5)
WBC: 7.6 10*3/uL (ref 4.0–10.5)
nRBC: 0 % (ref 0.0–0.2)

## 2018-12-27 LAB — TROPONIN I: Troponin I: 0.07 ng/mL (ref <0.03)

## 2018-12-27 MED ORDER — SODIUM CHLORIDE 0.9% FLUSH
3.0000 mL | Freq: Once | INTRAVENOUS | Status: AC
  Administered 2018-12-28: 3 mL via INTRAVENOUS

## 2018-12-27 NOTE — ED Notes (Signed)
Dr Zenia Resides aware K 6, Troponin 0.07

## 2018-12-27 NOTE — Progress Notes (Addendum)
Subjective:    Patient ID: [name], male    DOB: [birth year], 58 y.o.   MRN: [id number]  HPI [name] is a 58 year old African-American male, established with Dr. Hilarie Fredrickson.  He has history of hypertension, end-stage renal disease on dialysis, atrial fib/flutter, COPD with chronic respiratory failure history of multiple adenomatous polyps, GERD, prior history of substance abuse and decompensated cirrhosis secondary to hepatitis B/hep C.  He has prior history of SBP. He has not been seen in our office since August 2019, but has had several ER visits this year and has had intermittent paracenteses after the ER visits. He comes in today for follow-up after recent ER visit and with complaints of ongoing abdominal pain which is been fairly constant over the past several months.  He states he usually feels better after a paracentesis but this does not completely relieve his pain.  He denies any vomiting.  Appetite has been okay, though he says he cannot eat as much when his abdomen is full.  Happy has not noticed any melena or hematochezia, says his stools are usually loose to diarrhea with lactulose. Currently denies any regular daily EtOH use but says he "tastes some". CT of the abdomen and pelvis was done on 12/17/2018 which showed cardiomegaly, cirrhotic appearing liver without mass, there was extensive vascular calcifications in the abdomen and bones are diffusely sclerotic extensive ascites and atrophic kidneys. Paracentesis was done on 12/13/2018 with removal of 2.45 L.  Stain showed a few WBCs no organisms and culture was negative. He returned to the ER with complaints of pain on 12/18/2018 and was sent for another paracentesis but was not felt to have enough fluid to tap. He is supposed to be on chronic Bactrim for prophylaxis given history of previous SBP but says he has not been taking this recently. He had previously been evaluated for potential TIPS and was not felt to be a candidate. Last  EGD July 2019, no varices he did have some scattered gastric erosions and plan was to follow-up in 2 years. Last colonoscopy July 2019.  Review of Systems Pertinent positive and negative review of systems were noted in the above HPI section.  All other review of systems was otherwise negative.  Outpatient Encounter Medications as of 12/26/2018  Medication Sig  . albuterol (VENTOLIN HFA) 108 (90 Base) MCG/ACT inhaler Inhale 2 puffs into the lungs every 6 (six) hours as needed for wheezing or shortness of breath.  Marland Kitchen aspirin 81 MG EC tablet Take 1 tablet (81 mg total) by mouth daily.  . bisacodyl (DULCOLAX) 5 MG EC tablet Take 5 mg by mouth daily as needed for moderate constipation.  . hydrALAZINE (APRESOLINE) 100 MG tablet Take 1 tablet (100 mg total) by mouth 2 (two) times daily as needed. Taking after HD on Dialysis days (Patient taking differently: Take 100 mg by mouth 2 (two) times daily as needed (high blood pressure). If needed on Monday, Wednesday, Friday take after dialysis)  . lactulose (CHRONULAC) 10 GM/15ML solution Take 45 mLs (30 g total) by mouth 2 (two) times daily.  . metoprolol tartrate (LOPRESSOR) 25 MG tablet Take 1 tablet (25 mg total) by mouth 2 (two) times daily.  . pantoprazole (PROTONIX) 40 MG tablet Take 1 tablet (40 mg total) by mouth See admin instructions. Take one tablet (40 mg) by mouth in the morning on Sunday, Tuesday, Thursday, Saturday, take one tablet (40 mg) after dialysis on Monday, Wednesday, Friday. May also take one tablet (40 mg) in  the evening as needed for acid reflux.  . sevelamer carbonate (RENVELA) 800 MG tablet Take 4 tablets with meals  And 2 tablets twice daily with snacks (Patient taking differently: Take 2,400-3,200 mg by mouth See admin instructions. Take 3 - 4 tablets (2400 - 3200 mg) up to five times daily with meals or snacks)  . SYMBICORT 160-4.5 MCG/ACT inhaler Inhale 2 puffs into the lungs 2 (two) times a day.  . zolpidem (AMBIEN) 5 MG tablet  Take 5 mg by mouth at bedtime.  . [DISCONTINUED] lactulose (CHRONULAC) 10 GM/15ML solution Take 45 mLs (30 g total) by mouth 2 (two) times daily.  . [DISCONTINUED] pantoprazole (PROTONIX) 40 MG tablet Take 1 tablet (40 mg total) by mouth daily at 12 noon. (Patient taking differently: Take 40 mg by mouth See admin instructions. Take one tablet (40 mg) by mouth in the morning on Sunday, Tuesday, Thursday, Saturday, take one tablet (40 mg) after dialysis on Monday, Wednesday, Friday. May also take one tablet (40 mg) in the evening as needed for acid reflux.)  . sulfamethoxazole-trimethoprim (BACTRIM DS) 800-160 MG tablet Take 1 tablet by mouth 2 (two) times daily.   No facility-administered encounter medications on file as of 12/26/2018.    Allergies  Allergen Reactions  . Morphine And Related Other (See Comments)    Stomach pain  . Aspirin Other (See Comments)    STOMACH PAIN  . Clonidine Derivatives Itching  . Tramadol Itching  . Tylenol [Acetaminophen] Nausea Only    Stomach ache   Patient Active Problem List   Diagnosis Date Noted  . Sepsis ([place]) 09/12/2018  . Atherosclerosis of native coronary artery of native heart without angina pectoris 03/11/2018  . Benign neoplasm of cecum   . Benign neoplasm of ascending colon   . Benign neoplasm of descending colon   . Benign neoplasm of rectum   . Paroxysmal atrial fibrillation ([place]) 01/23/2018  . Hx of colonic polyps 01/20/2018  . ESRD (end stage renal disease) ([place]) 11/21/2017  . GERD (gastroesophageal reflux disease) 11/16/2017  . Liver cirrhosis ([place]) 11/15/2017  . DNR (do not resuscitate)   . Palliative care by specialist   . Hyponatremia 11/04/2017  . SBP (spontaneous bacterial peritonitis) ([place]) 10/30/2017  . Liver disease, chronic 10/30/2017  . SOB (shortness of breath)   . Abdominal pain 10/28/2017  . Upper airway cough syndrome with flattening on f/v loop 10/13/17 c/w vcd 10/17/2017  . Elevated diaphragm 10/13/2017  . Ileus  ([place]) 09/29/2017  . Malnutrition of moderate degree 09/29/2017  . Sinus congestion 09/03/2017  . Symptomatic anemia 09/02/2017  . Other cirrhosis of liver ([place]) 09/02/2017  . Left bundle branch block 09/02/2017  . Mitral stenosis 09/02/2017  . Hematochezia 07/15/2017  . Wide-complex tachycardia ([place])   . Endotracheally intubated   . ESRD on dialysis ([place]) 07/04/2017  . Acute respiratory failure with hypoxia ([place]) 06/18/2017  . CKD (chronic kidney disease) stage V requiring chronic dialysis ([place]) 06/18/2017  . History of Cocaine abuse ([place]) 06/18/2017  . Hypertension 06/18/2017  . Infection of AV graft for dialysis ([place]) 06/18/2017  . Anxiety 06/18/2017  . Anemia due to chronic kidney disease 06/18/2017  . Atrial flutter with rapid ventricular response ([place]) 06/18/2017  . Personality disorder ([place]) 06/13/2017  . Cellulitis 06/12/2017  . Adjustment disorder with mixed anxiety and depressed mood 06/10/2017  . Suicidal ideation 06/10/2017  . Arm wound, left, sequela 06/10/2017  . Dyspnea on exertion 05/29/2017  . Tachycardia 05/29/2017  . Hyperkalemia 05/22/2017  .  Acute metabolic encephalopathy   . Anemia 04/23/2017  . Ascites 04/23/2017  . COPD (chronic obstructive pulmonary disease) ([place]) 04/23/2017  . Acute on chronic respiratory failure with hypoxia ([place]) 03/25/2017  . Arrhythmia 03/25/2017  . COPD GOLD 0 with flattening on inps f/v  09/27/2016  . Essential hypertension 09/27/2016  . Fluid overload 08/30/2016  . COPD exacerbation ([place]) 08/17/2016  . Hypertensive urgency 08/17/2016  . Respiratory failure ([place]) 08/17/2016  . Problem with dialysis access ([place]) 07/23/2016  . Chronic hepatitis B ([place]) 03/05/2014  . Chronic hepatitis C without hepatic coma ([place]) 03/05/2014  . Internal hemorrhoids with bleeding, swelling and itching 03/05/2014  . Thrombocytopenia ([place]) 03/05/2014  . Chest pain 02/27/2014  . Alcohol abuse 04/14/2009  . Cigarette smoker 04/14/2009  . GANGLION  CYST 04/14/2009   Social History   Socioeconomic History  . Marital status: Single    Spouse name: Not on file  . Number of children: 3  . Years of education: 10  . Highest education level: Not on file  Occupational History  . Occupation: Unemployed  Social Needs  . Financial resource strain: Not on file  . Food insecurity    Worry: Not on file    Inability: Not on file  . Transportation needs    Medical: Not on file    Non-medical: Not on file  Tobacco Use  . Smoking status: Current Every Day Smoker    Packs/day: 0.50    Years: 43.00    Pack years: 21.50    Types: Cigarettes    Start date: 08/13/1973  . Smokeless tobacco: Never Used  Substance and Sexual Activity  . Alcohol use: Not Currently    Frequency: Never    Comment: quit drinking in 2017  . Drug use: Yes    Types: Marijuana  . Sexual activity: Not on file    Comment: "NOT LATELY" on cocaine  Lifestyle  . Physical activity    Days per week: Not on file    Minutes per session: Not on file  . Stress: Not on file  Relationships  . Social Herbalist on phone: Not on file    Gets together: Not on file    Attends religious service: Not on file    Active member of club or organization: Not on file    Attends meetings of clubs or organizations: Not on file    Relationship status: Not on file  . Intimate partner violence    Fear of current or ex partner: Not on file    Emotionally abused: Not on file    Physically abused: Not on file    Forced sexual activity: Not on file  Other Topics Concern  . Not on file  Social History Narrative   Lives alone   Caffeine use: Coffee-rare   Soda- daily       Pulmonary (03/10/17):   Originally from [facility]. Previously worked trimming trees. No pets currently. No bird or mold exposure.     Mr. Ishida family history includes COPD in his father and another family member; Esophageal cancer in his sister; Heart disease in his father and mother; Hypertension in  an other family member; Lung cancer in his mother; Malignant hyperthermia in his father; Throat cancer in his sister.      Objective:    Vitals:   12/26/18 1005  BP: (!) 128/58  Pulse: 81  Temp: 97.7 F (36.5 C)    Physical Exam; Well-developed older chronic ill-appearing African-American male  in no acute distress.  Height, 5 foot 9 weight, 160 pound BMI 23.6  HEENT; nontraumatic normocephalic, EOMI, PE R LA, sclera anicteric. Oropharynx; not examined/mask/COVID Neck; supple, no JVD Cardiovascular; regular rate and rhythm with S1-S2, no murmur rub or gallop Pulmonary; Clear bilaterally Abdomen; soft, significant ascites severely tense there is some mild diffuse tenderness no guarding or rebound, no palpable mass, bowel sounds are active Rectal; not done Skin; benign exam, no jaundice rash or appreciable lesions Extremities; no clubbing cyanosis or edema skin warm and dry Neuro/Psych; alert and oriented x4, grossly nonfocal mood and affect appropriate.  No asterixis       Assessment & Plan:   #60 58 year old African-American male with decompensated cirrhosis secondary to hepatitis B/hep C/EtOH. Patient presenting now with complaints of persistent abdominal pain over the past several months.  He has not been on any scheduled regimen for paracenteses but has had several ER visits to recent paracenteses as above.  Prior history of SBP No varices on EGD 01/2018  Etiology of his pain is not clear, certainly his discomfort appears to be worse with increasing ascites no evidence for SBP on cell count 12/13/2018.  #2 end-stage renal disease/on dialysis #3.  History of atrial fibrillation a flutter #4 COPD with chronic respiratory failure #5 History of multiple colon polyps-up-to-date with colonoscopy last done July 2019 #6 GERD  Plan; restart Bactrim DS 1 p.o. every morning.  Patient advised that he should be on this chronically and to take daily. Continue lactulose 45 cc daily  Continue Protonix 40 mg p.o. every morning. He will use regular strength Tylenol 1-2 twice daily as needed for pain, advised no aspirin or NSAIDs Patient will be scheduled for large-volume paracentesis to remove 5 to 6 L of fluid, and will receive 50 g of albumin. He may benefit from scheduled paracenteses, I will discuss with Dr. Hilarie Fredrickson regarding Q 2 to 3-week schedule. Check alpha-fetoprotein level, INR. Plan office follow-up with Dr. Hilarie Fredrickson in 4 to 6 weeks.   Sonya Gunnoe Genia Harold PA-C 12/27/2018   Cc: [name], FNP   Addendum: Reviewed and agree with assessment and management plan. I am ok with scheduled LVP if this makes it easier for him (to have be on a schedule).  He can delay or move them up based on symptoms with his ascites I will see him in follow-up Pyrtle, Lajuan Lines, MD

## 2018-12-27 NOTE — ED Triage Notes (Signed)
BIB EMS from home. Pt reports SOB, abd distension X few days. Dialysis, MWF, was unable to complete full dialysis treatment today. States he is scheduled to have a paracentesis in a few days.

## 2018-12-28 ENCOUNTER — Observation Stay (HOSPITAL_COMMUNITY): Payer: Medicare Other

## 2018-12-28 ENCOUNTER — Ambulatory Visit (HOSPITAL_COMMUNITY): Admission: RE | Admit: 2018-12-28 | Payer: Medicare Other | Source: Ambulatory Visit

## 2018-12-28 ENCOUNTER — Encounter (HOSPITAL_COMMUNITY): Payer: Self-pay | Admitting: Radiology

## 2018-12-28 DIAGNOSIS — N2581 Secondary hyperparathyroidism of renal origin: Secondary | ICD-10-CM | POA: Diagnosis not present

## 2018-12-28 DIAGNOSIS — K7031 Alcoholic cirrhosis of liver with ascites: Secondary | ICD-10-CM | POA: Diagnosis not present

## 2018-12-28 DIAGNOSIS — K7460 Unspecified cirrhosis of liver: Secondary | ICD-10-CM

## 2018-12-28 DIAGNOSIS — E8770 Fluid overload, unspecified: Secondary | ICD-10-CM | POA: Diagnosis not present

## 2018-12-28 DIAGNOSIS — I120 Hypertensive chronic kidney disease with stage 5 chronic kidney disease or end stage renal disease: Secondary | ICD-10-CM | POA: Diagnosis not present

## 2018-12-28 DIAGNOSIS — D631 Anemia in chronic kidney disease: Secondary | ICD-10-CM | POA: Diagnosis not present

## 2018-12-28 DIAGNOSIS — E875 Hyperkalemia: Secondary | ICD-10-CM | POA: Diagnosis not present

## 2018-12-28 DIAGNOSIS — R8569 Abnormal cytological findings in specimens from other digestive organs and abdominal cavity: Secondary | ICD-10-CM | POA: Diagnosis not present

## 2018-12-28 DIAGNOSIS — R0602 Shortness of breath: Secondary | ICD-10-CM | POA: Diagnosis not present

## 2018-12-28 DIAGNOSIS — R188 Other ascites: Secondary | ICD-10-CM

## 2018-12-28 DIAGNOSIS — N186 End stage renal disease: Secondary | ICD-10-CM | POA: Diagnosis not present

## 2018-12-28 DIAGNOSIS — Z992 Dependence on renal dialysis: Secondary | ICD-10-CM | POA: Diagnosis not present

## 2018-12-28 HISTORY — PX: IR PARACENTESIS: IMG2679

## 2018-12-28 LAB — CBC
HCT: 31.5 % — ABNORMAL LOW (ref 39.0–52.0)
Hemoglobin: 11 g/dL — ABNORMAL LOW (ref 13.0–17.0)
MCH: 27.8 pg (ref 26.0–34.0)
MCHC: 34.9 g/dL (ref 30.0–36.0)
MCV: 79.7 fL — ABNORMAL LOW (ref 80.0–100.0)
Platelets: 111 10*3/uL — ABNORMAL LOW (ref 150–400)
RBC: 3.95 MIL/uL — ABNORMAL LOW (ref 4.22–5.81)
RDW: 16.1 % — ABNORMAL HIGH (ref 11.5–15.5)
WBC: 6.9 10*3/uL (ref 4.0–10.5)
nRBC: 0.3 % — ABNORMAL HIGH (ref 0.0–0.2)

## 2018-12-28 LAB — BODY FLUID CELL COUNT WITH DIFFERENTIAL
Lymphs, Fluid: 74 %
Monocyte-Macrophage-Serous Fluid: 24 % — ABNORMAL LOW (ref 50–90)
Neutrophil Count, Fluid: 2 % (ref 0–25)
Total Nucleated Cell Count, Fluid: 270 cu mm (ref 0–1000)

## 2018-12-28 LAB — BASIC METABOLIC PANEL
Anion gap: 19 — ABNORMAL HIGH (ref 5–15)
BUN: 36 mg/dL — ABNORMAL HIGH (ref 6–20)
CO2: 21 mmol/L — ABNORMAL LOW (ref 22–32)
Calcium: 9.6 mg/dL (ref 8.9–10.3)
Chloride: 95 mmol/L — ABNORMAL LOW (ref 98–111)
Creatinine, Ser: 8.49 mg/dL — ABNORMAL HIGH (ref 0.61–1.24)
GFR calc Af Amer: 7 mL/min — ABNORMAL LOW (ref >60)
GFR calc non Af Amer: 6 mL/min — ABNORMAL LOW (ref >60)
Glucose, Bld: 74 mg/dL (ref 70–99)
Potassium: 6 mmol/L — ABNORMAL HIGH (ref 3.5–5.1)
Sodium: 135 mmol/L (ref 135–145)

## 2018-12-28 LAB — GRAM STAIN

## 2018-12-28 LAB — CBG MONITORING, ED: Glucose-Capillary: 75 mg/dL (ref 70–99)

## 2018-12-28 LAB — TROPONIN I
Troponin I: 0.07 ng/mL (ref <0.03)
Troponin I: 0.07 ng/mL (ref <0.03)
Troponin I: 0.07 ng/mL (ref <0.03)
Troponin I: 0.07 ng/mL (ref <0.03)

## 2018-12-28 LAB — MAGNESIUM: Magnesium: 2.1 mg/dL (ref 1.7–2.4)

## 2018-12-28 LAB — POTASSIUM: Potassium: 4.2 mmol/L (ref 3.5–5.1)

## 2018-12-28 LAB — SARS CORONAVIRUS 2: SARS Coronavirus 2: NOT DETECTED

## 2018-12-28 MED ORDER — HEPARIN SODIUM (PORCINE) 1000 UNIT/ML DIALYSIS
1000.0000 [IU] | INTRAMUSCULAR | Status: DC | PRN
  Filled 2018-12-28: qty 1

## 2018-12-28 MED ORDER — CHLORHEXIDINE GLUCONATE CLOTH 2 % EX PADS: 6.0000 | MEDICATED_PAD | Freq: Every day | CUTANEOUS | Status: DC

## 2018-12-28 MED ORDER — SULFAMETHOXAZOLE-TRIMETHOPRIM 800-160 MG PO TABS: 1.0000 | ORAL_TABLET | Freq: Two times a day (BID) | ORAL | Status: DC

## 2018-12-28 MED ORDER — METOPROLOL TARTRATE 25 MG PO TABS
25.0000 mg | ORAL_TABLET | Freq: Two times a day (BID) | ORAL | Status: DC
  Administered 2018-12-28 – 2018-12-30 (×4): 25 mg via ORAL
  Filled 2018-12-28 (×5): qty 1

## 2018-12-28 MED ORDER — DEXTROSE 50 % IV SOLN
1.0000 | Freq: Once | INTRAVENOUS | Status: AC
  Administered 2018-12-28: 50 mL via INTRAVENOUS
  Filled 2018-12-28: qty 50

## 2018-12-28 MED ORDER — SODIUM CHLORIDE 0.9 % IV SOLN
2.0000 g | Freq: Every day | INTRAVENOUS | Status: DC
  Administered 2018-12-28: 2 g via INTRAVENOUS
  Filled 2018-12-28: qty 20

## 2018-12-28 MED ORDER — HYDROXYZINE HCL 10 MG PO TABS
10.0000 mg | ORAL_TABLET | ORAL | Status: DC | PRN
  Administered 2018-12-28 – 2018-12-30 (×2): 10 mg via ORAL
  Filled 2018-12-28 (×2): qty 1

## 2018-12-28 MED ORDER — LIDOCAINE HCL 1 % IJ SOLN
INTRAMUSCULAR | Status: AC
  Filled 2018-12-28: qty 20

## 2018-12-28 MED ORDER — HEPARIN SODIUM (PORCINE) 5000 UNIT/ML IJ SOLN
5000.0000 [IU] | Freq: Three times a day (TID) | INTRAMUSCULAR | Status: DC
  Filled 2018-12-28 (×3): qty 1

## 2018-12-28 MED ORDER — SODIUM CHLORIDE 0.9 % IV SOLN: 100.0000 mL | INTRAVENOUS | Status: DC | PRN

## 2018-12-28 MED ORDER — ACETAMINOPHEN 325 MG PO TABS
650.0000 mg | ORAL_TABLET | Freq: Four times a day (QID) | ORAL | Status: DC | PRN
  Administered 2018-12-28 – 2018-12-30 (×5): 650 mg via ORAL
  Filled 2018-12-28 (×4): qty 2

## 2018-12-28 MED ORDER — PANTOPRAZOLE SODIUM 40 MG PO TBEC: 40.0000 mg | DELAYED_RELEASE_TABLET | Freq: Every evening | ORAL | Status: DC | PRN

## 2018-12-28 MED ORDER — ALBUMIN HUMAN 5 % IV SOLN
50.0000 g | Freq: Once | INTRAVENOUS | Status: AC
  Administered 2018-12-28: 50 g via INTRAVENOUS
  Filled 2018-12-28: qty 1000

## 2018-12-28 MED ORDER — INSULIN ASPART 100 UNIT/ML ~~LOC~~ SOLN
5.0000 [IU] | Freq: Once | SUBCUTANEOUS | Status: AC
  Administered 2018-12-28: 5 [IU] via SUBCUTANEOUS

## 2018-12-28 MED ORDER — SEVELAMER CARBONATE 800 MG PO TABS
1600.0000 mg | ORAL_TABLET | Freq: Three times a day (TID) | ORAL | Status: DC
  Administered 2018-12-28 – 2018-12-30 (×7): 1600 mg via ORAL
  Filled 2018-12-28 (×7): qty 2

## 2018-12-28 MED ORDER — PENTAFLUOROPROP-TETRAFLUOROETH EX AERO: 1.0000 "application " | INHALATION_SPRAY | CUTANEOUS | Status: DC | PRN

## 2018-12-28 MED ORDER — PANTOPRAZOLE SODIUM 40 MG PO TBEC
40.0000 mg | DELAYED_RELEASE_TABLET | Freq: Every day | ORAL | Status: DC
  Administered 2018-12-28 – 2018-12-30 (×3): 40 mg via ORAL
  Filled 2018-12-28: qty 1
  Filled 2018-12-28: qty 2
  Filled 2018-12-28: qty 1

## 2018-12-28 MED ORDER — HYDRALAZINE HCL 20 MG/ML IJ SOLN
5.0000 mg | INTRAMUSCULAR | Status: DC | PRN
  Administered 2018-12-30: 5 mg via INTRAVENOUS
  Filled 2018-12-28: qty 1

## 2018-12-28 MED ORDER — METHOCARBAMOL 1000 MG/10ML IJ SOLN
500.0000 mg | Freq: Three times a day (TID) | INTRAVENOUS | Status: DC | PRN
  Filled 2018-12-28 (×2): qty 5

## 2018-12-28 MED ORDER — LACTULOSE 10 GM/15ML PO SOLN
30.0000 g | Freq: Two times a day (BID) | ORAL | Status: DC
  Filled 2018-12-28 (×3): qty 45

## 2018-12-28 MED ORDER — CALCITRIOL 0.5 MCG PO CAPS
3.0000 ug | ORAL_CAPSULE | ORAL | Status: DC
  Administered 2018-12-28 – 2018-12-30 (×2): 3 ug via ORAL
  Filled 2018-12-28 (×2): qty 6

## 2018-12-28 MED ORDER — SODIUM ZIRCONIUM CYCLOSILICATE 10 G PO PACK
10.0000 g | PACK | Freq: Once | ORAL | Status: AC
  Administered 2018-12-28: 10 g via ORAL
  Filled 2018-12-28 (×2): qty 1

## 2018-12-28 MED ORDER — ASPIRIN 81 MG PO TBEC: 81.0000 mg | DELAYED_RELEASE_TABLET | Freq: Every day | ORAL | Status: DC

## 2018-12-28 MED ORDER — LIDOCAINE HCL (PF) 1 % IJ SOLN: 5.0000 mL | INTRAMUSCULAR | Status: DC | PRN

## 2018-12-28 MED ORDER — HYDROCODONE-ACETAMINOPHEN 5-325 MG PO TABS
1.0000 | ORAL_TABLET | Freq: Once | ORAL | Status: AC
  Administered 2018-12-28: 1 via ORAL
  Filled 2018-12-28: qty 1

## 2018-12-28 MED ORDER — OXYCODONE-ACETAMINOPHEN 5-325 MG PO TABS
1.0000 | ORAL_TABLET | Freq: Once | ORAL | Status: AC
  Administered 2018-12-28: 1 via ORAL
  Filled 2018-12-28: qty 1

## 2018-12-28 MED ORDER — ALTEPLASE 2 MG IJ SOLR: 2.0000 mg | Freq: Once | INTRAMUSCULAR | Status: DC | PRN

## 2018-12-28 MED ORDER — IPRATROPIUM-ALBUTEROL 0.5-2.5 (3) MG/3ML IN SOLN: 3.0000 mL | Freq: Four times a day (QID) | RESPIRATORY_TRACT | Status: DC | PRN

## 2018-12-28 MED ORDER — LIDOCAINE-PRILOCAINE 2.5-2.5 % EX CREA
1.0000 "application " | TOPICAL_CREAM | CUTANEOUS | Status: DC | PRN
  Filled 2018-12-28: qty 5

## 2018-12-28 MED ORDER — BISACODYL 5 MG PO TBEC: 5.0000 mg | DELAYED_RELEASE_TABLET | Freq: Every day | ORAL | Status: DC | PRN

## 2018-12-28 MED ORDER — LIDOCAINE HCL (PF) 1 % IJ SOLN
INTRAMUSCULAR | Status: DC | PRN
  Administered 2018-12-28: 10 mL

## 2018-12-28 MED ORDER — CAMPHOR-MENTHOL 0.5-0.5 % EX LOTN
TOPICAL_LOTION | CUTANEOUS | Status: DC | PRN
  Administered 2018-12-28: 17:00:00 via TOPICAL
  Filled 2018-12-28 (×2): qty 222

## 2018-12-28 MED ORDER — ACETAMINOPHEN 325 MG PO TABS
ORAL_TABLET | ORAL | Status: AC
  Filled 2018-12-28: qty 2

## 2018-12-28 MED ORDER — ACETAMINOPHEN 650 MG RE SUPP: 650.0000 mg | Freq: Four times a day (QID) | RECTAL | Status: DC | PRN

## 2018-12-28 MED ORDER — SODIUM BICARBONATE 8.4 % IV SOLN
25.0000 meq | Freq: Once | INTRAVENOUS | Status: AC
  Administered 2018-12-28: 25 meq via INTRAVENOUS
  Filled 2018-12-28: qty 50

## 2018-12-28 MED ORDER — MOMETASONE FURO-FORMOTEROL FUM 200-5 MCG/ACT IN AERO
2.0000 | INHALATION_SPRAY | Freq: Two times a day (BID) | RESPIRATORY_TRACT | Status: DC
  Administered 2018-12-29 – 2018-12-30 (×3): 2 via RESPIRATORY_TRACT
  Filled 2018-12-28 (×2): qty 8.8

## 2018-12-28 MED ORDER — SULFAMETHOXAZOLE-TRIMETHOPRIM 800-160 MG PO TABS
1.0000 | ORAL_TABLET | Freq: Every day | ORAL | Status: DC
  Administered 2018-12-28 – 2018-12-30 (×3): 1 via ORAL
  Filled 2018-12-28 (×3): qty 1

## 2018-12-28 NOTE — Progress Notes (Signed)
Patient stated : " I want to have another tray, and I want this iv off if I don't get any pain medication, and this thing in my chest I want it off too"   Patient was educated on importance of keeping his peripheral IV and cardiac monitoring.  When transportation arrived to take to paracentesis, pt was refusing to go and education was given. Patient was taken to paracentesis. When leaving pt stated that he wanted and takes percocet 5 mg at home.     Albumin iv, calcitriol  And pain medication to be administer when patient comes back to unit.

## 2018-12-28 NOTE — Progress Notes (Signed)
Hemodialysis- Patient signed off with 24 minutes remaining. Discussed importance of finishing treatments with patient. Breathing has improved. Patient taking nasal cannula off and does not want to wear, sats have remained over 90%. Report given to primary RN. Patient returned to room

## 2018-12-28 NOTE — Progress Notes (Signed)
Patient admitted after midnight, please see H&P.  Here with non-compliance with HD and volume overload.  Got HD today and per nephrology note, plan is for HD in AM.  Also was due to paracentesis per GI [facility].  IR removed 3.4L.  Does not appear infected.   C/o right flank/back pain-- sharp in nature.  Asking for dilaudid and percocet by name-- says he takes percocet at home --reviewed the PDMP-- no narcotics since 12/19 .  Will get U/A (if he produces urine) and consider further imaging pending symptoms.   Eulogio Bear DO

## 2018-12-28 NOTE — ED Notes (Signed)
ED TO INPATIENT HANDOFF REPORT  ED Nurse Name and Phone #: [phone]  S Name/Age/Gender [name] 58 y.o. male Room/Bed: 031C/031C  Code Status   Code Status: DNR  Home/SNF/Other Home Patient oriented to: self, place, time and situation Is this baseline? Yes   Triage Complete: Triage complete  Chief Complaint SOB  Triage Note BIB EMS from home. Pt reports SOB, abd distension X few days. Dialysis, MWF, was unable to complete full dialysis treatment today. States he is scheduled to have a paracentesis in a few days.    Allergies Allergies  Allergen Reactions  . Morphine And Related Other (See Comments)    Stomach pain  . Aspirin Other (See Comments)    STOMACH PAIN  . Clonidine Derivatives Itching  . Tramadol Itching  . Tylenol [Acetaminophen] Nausea Only    Stomach ache    Level of Care/Admitting Diagnosis ED Disposition    ED Disposition Condition [facility]: [place] [[id number]]  Level of Care: Telemetry Cardiac [103]  I expect the patient will be discharged within 24 hours: No (not a candidate for 5C-Observation unit)  Covid Evaluation: Person Under Investigation (PUI)  Isolation Risk Level: Low Risk/Droplet (Less than 4L Honor supplementation)  Diagnosis: Volume overload [700174]  Admitting Physician: Shela Leff [[id number]]  Attending Physician: Shela Leff [[id number]]  PT Class (Do Not Modify): Observation [104]  PT Acc Code (Do Not Modify): Observation [10022]       B Medical/Surgery History Past Medical History:  Diagnosis Date  . Anemia   . Anxiety   . Arthritis    left shoulder  . Atherosclerosis of aorta ([place])   . Cardiomegaly   . Chest pain    DATE UNKNOWN, C/O PERIODICALLY  . Cocaine abuse ([place])   . COPD exacerbation ([place]) 08/17/2016  . Coronary artery disease    stent 02/22/17  . ESRD (end stage renal disease) on dialysis ([place])    "[name]; MWF" (07/04/2017)  . GERD  (gastroesophageal reflux disease)    DATE UNKNOWN  . Hemorrhoids   . Hepatitis B, chronic ([place])   . Hepatitis C   . History of kidney stones   . Hyperkalemia   . Hypertension   . Kidney failure   . Metabolic bone disease    Patient denies  . Mitral stenosis   . Myocardial infarction ([place])   . Pneumonia   . Pulmonary edema   . Solitary rectal ulcer syndrome 07/2017   at flex sig for rectal bleeding  . Tubular adenoma of colon    Past Surgical History:  Procedure Laterality Date  . A/V FISTULAGRAM Left 05/26/2017   Procedure: A/V FISTULAGRAM;  Surgeon: Conrad [place], MD;  Location: [facility];  Service: Cardiovascular;  Laterality: Left;  . A/V FISTULAGRAM Right 11/18/2017   Procedure: A/V FISTULAGRAM - Right Arm;  Surgeon: Elam Dutch, MD;  Location: [facility];  Service: Cardiovascular;  Laterality: Right;  . APPLICATION OF WOUND VAC Left 06/14/2017   Procedure: APPLICATION OF WOUND VAC;  Surgeon: Katha Cabal, MD;  Location: [facility];  Service: Vascular;  Laterality: Left;  . AV FISTULA PLACEMENT  2012   BELIEVED WAS PLACED IN JUNE  . AV FISTULA PLACEMENT Right 08/09/2017   Procedure: Creation Right arm ARTERIOVENOUS BRACHIOCEPOHALIC FISTULA;  Surgeon: Elam Dutch, MD;  Location: [facility];  Service: Vascular;  Laterality: Right;  . AV FISTULA PLACEMENT Right 11/22/2017   Procedure: INSERTION  OF ARTERIOVENOUS (AV) GORE-TEX GRAFT RIGHT UPPER ARM;  Surgeon: Elam Dutch, MD;  Location: [facility];  Service: Vascular;  Laterality: Right;  . BIOPSY  01/25/2018   Procedure: BIOPSY;  Surgeon: Jerene Bears, MD;  Location: [facility];  Service: Gastroenterology;;  . COLONOSCOPY    . COLONOSCOPY WITH PROPOFOL N/A 01/25/2018   Procedure: COLONOSCOPY WITH PROPOFOL;  Surgeon: Jerene Bears, MD;  Location: [facility];  Service: Gastroenterology;  Laterality: N/A;  . CORONARY STENT INTERVENTION N/A 02/22/2017   Procedure: CORONARY STENT INTERVENTION;  Surgeon:  Nigel Mormon, MD;  Location: [place] [facility];  Service: Cardiovascular;  Laterality: N/A;  . ESOPHAGOGASTRODUODENOSCOPY (EGD) WITH PROPOFOL N/A 01/25/2018   Procedure: ESOPHAGOGASTRODUODENOSCOPY (EGD) WITH PROPOFOL;  Surgeon: Jerene Bears, MD;  Location: [place] [facility];  Service: Gastroenterology;  Laterality: N/A;  . FLEXIBLE SIGMOIDOSCOPY N/A 07/15/2017   Procedure: FLEXIBLE SIGMOIDOSCOPY;  Surgeon: Carol Ada, MD;  Location: [place] [place];  Service: Endoscopy;  Laterality: N/A;  . HEMORRHOID BANDING    . I&D EXTREMITY Left 06/01/2017   Procedure: IRRIGATION AND DEBRIDEMENT LEFT ARM HEMATOMA WITH LIGATION OF LEFT ARM AV FISTULA;  Surgeon: Elam Dutch, MD;  Location: [place];  Service: Vascular;  Laterality: Left;  . I&D EXTREMITY Left 06/14/2017   Procedure: IRRIGATION AND DEBRIDEMENT EXTREMITY;  Surgeon: Katha Cabal, MD;  Location: [facility];  Service: Vascular;  Laterality: Left;  . INSERTION OF DIALYSIS CATHETER  05/30/2017  . INSERTION OF DIALYSIS CATHETER N/A 05/30/2017   Procedure: INSERTION OF DIALYSIS CATHETER;  Surgeon: Elam Dutch, MD;  Location: [place];  Service: Vascular;  Laterality: N/A;  . IR PARACENTESIS  08/30/2017  . IR PARACENTESIS  09/29/2017  . IR PARACENTESIS  10/28/2017  . IR PARACENTESIS  11/09/2017  . IR PARACENTESIS  11/16/2017  . IR PARACENTESIS  11/28/2017  . IR PARACENTESIS  12/01/2017  . IR PARACENTESIS  12/06/2017  . IR PARACENTESIS  01/03/2018  . IR PARACENTESIS  01/23/2018  . IR PARACENTESIS  02/07/2018  . IR PARACENTESIS  02/21/2018  . IR PARACENTESIS  03/06/2018  . IR PARACENTESIS  03/17/2018  . IR PARACENTESIS  04/04/2018  . IR RADIOLOGIST EVAL & MGMT  02/14/2018  . LEFT HEART CATH AND CORONARY ANGIOGRAPHY N/A 02/22/2017   Procedure: LEFT HEART CATH AND CORONARY ANGIOGRAPHY;  Surgeon: Nigel Mormon, MD;  Location: [facility];  Service: Cardiovascular;  Laterality: N/A;  . LIGATION OF ARTERIOVENOUS  FISTULA Left 11/12/6501    Procedure: Plication of Left Arm Arteriovenous Fistula;  Surgeon: Elam Dutch, MD;  Location: [facility];  Service: Vascular;  Laterality: Left;  . POLYPECTOMY    . POLYPECTOMY  01/25/2018   Procedure: POLYPECTOMY;  Surgeon: Jerene Bears, MD;  Location: [place];  Service: Gastroenterology;;  . REVISON OF ARTERIOVENOUS FISTULA Left 5/46/5681   Procedure: PLICATION OF DISTAL ANEURYSMAL SEGEMENT OF LEFT UPPER ARM ARTERIOVENOUS FISTULA;  Surgeon: Elam Dutch, MD;  Location: [facility];  Service: Vascular;  Laterality: Left;  . REVISON OF ARTERIOVENOUS FISTULA Left 2/75/1700   Procedure: Plication of Left Upper Arm Fistula ;  Surgeon: Waynetta Sandy, MD;  Location: [place];  Service: Vascular;  Laterality: Left;  . SKIN GRAFT SPLIT THICKNESS LEG / FOOT Left    SKIN GRAFT SPLIT THICKNESS LEFT ARM DONOR SITE: LEFT ANTERIOR THIGH  . SKIN SPLIT GRAFT Left 07/04/2017   Procedure: SKIN GRAFT SPLIT THICKNESS LEFT ARM DONOR SITE: LEFT ANTERIOR THIGH;  Surgeon: Ruta Hinds  E, MD;  Location: [place];  Service: Vascular;  Laterality: Left;  . THROMBECTOMY W/ EMBOLECTOMY Left 06/05/2017   Procedure: EXPLORATION OF LEFT ARM FOR BLEEDING; OVERSEWED PROXIMAL FISTULA;  Surgeon: Angelia Mould, MD;  Location: [facility];  Service: Vascular;  Laterality: Left;  . WOUND EXPLORATION Left 06/03/2017   Procedure: WOUND EXPLORATION WITH WOUND VAC APPLICATION TO LEFT ARM;  Surgeon: Angelia Mould, MD;  [facility];  Service: Vascular;  Laterality: Left;     A IV Location/Drains/Wounds Patient Lines/Drains/Airways Status   Active Line/Drains/Airways    Name:   Placement date:   Placement time:   Site:   Days:   Peripheral IV 12/28/18 Left;Posterior Arm   12/28/18    0421    Arm   less than 1   Fistula / Graft Right Upper arm Arteriovenous vein graft   11/22/17    0836    Upper arm   401   Fistula / Graft Right Upper arm   -    -    Upper arm      Hemodialysis Catheter Right Subclavian  Double-lumen   05/30/17    1734    Subclavian   577          Intake/Output Last 24 hours No intake or output data in the 24 hours ending 12/28/18 0518  Labs/Imaging Results for orders placed or performed during the hospital encounter of 12/27/18 (from the past 48 hour(s))  Basic metabolic panel     Status: Abnormal   Collection Time: 12/27/18  9:34 PM  Result Value Ref Range   Sodium 136 135 - 145 mmol/L   Potassium 6.0 (H) 3.5 - 5.1 mmol/L    Comment: NO VISIBLE HEMOLYSIS   Chloride 97 (L) 98 - 111 mmol/L   CO2 22 22 - 32 mmol/L   Glucose, Bld 83 70 - 99 mg/dL   BUN 35 (H) 6 - 20 mg/dL   Creatinine, Ser 8.18 (H) 0.61 - 1.24 mg/dL   Calcium 9.8 8.9 - 10.3 mg/dL   GFR calc non Af Amer 7 (L) >60 mL/min   GFR calc Af Amer 8 (L) >60 mL/min   Anion gap 17 (H) 5 - 15    Comment: Performed at [facility], [address] [place], [place] [address]  CBC     Status: Abnormal   Collection Time: 12/27/18  9:34 PM  Result Value Ref Range   WBC 7.6 4.0 - 10.5 K/uL   RBC 4.03 (L) 4.22 - 5.81 MIL/uL   Hemoglobin 11.3 (L) 13.0 - 17.0 g/dL   HCT 32.4 (L) 39.0 - 52.0 %   MCV 80.4 80.0 - 100.0 fL   MCH 28.0 26.0 - 34.0 pg   MCHC 34.9 30.0 - 36.0 g/dL   RDW 16.0 (H) 11.5 - 15.5 %   Platelets 114 (L) 150 - 400 K/uL    Comment: REPEATED TO VERIFY PLATELET COUNT CONFIRMED BY SMEAR Immature Platelet Fraction may be clinically indicated, consider ordering this additional test [id number]    nRBC 0.0 0.0 - 0.2 %    Comment: Performed at [facility], [place] [address], [place], [place] [address]  Troponin I - ONCE - STAT     Status: Abnormal   Collection Time: 12/27/18  9:34 PM  Result Value Ref Range   Troponin I 0.07 (HH) <0.03 ng/mL    Comment: CRITICAL RESULT CALLED TO, READ BACK BY AND VERIFIED WITH: PHILLIPS T,RN 12/27/18 2238 WAYK  Performed at [facility], [place] [address] [place], [place] [address]   SARS Coronavirus 2     Status: None   Collection Time: 12/28/18   2:50 AM  Result Value Ref Range   SARS Coronavirus 2 NOT DETECTED NOT DETECTED    Comment: (NOTE) SARS-CoV-2 target nucleic acids are NOT DETECTED. The SARS-CoV-2 RNA is generally detectable in upper and lower respiratory specimens during the acute phase of infection.  Negative  results do not preclude SARS-CoV-2 infection, do not rule out co-infections with other pathogens, and should not be used as the sole basis for treatment or other patient management decisions.  Negative results must be combined with clinical observations, patient history, and epidemiological information. The expected result is Not Detected. Fact Sheet for Patients: http://www.biofiredefense.com/wp-content/uploads/2020/03/BIOFIRE-COVID -19-patients.pdf Fact Sheet for Healthcare Providers: http://www.biofiredefense.com/wp-content/uploads/2020/03/BIOFIRE-COVID -19-hcp.pdf This test is not yet approved or cleared by the [place] and  has been authorized for detection and/or diagnosis of SARS-CoV-2 by FDA under an Emergency Use Authorization (EUA).  This EUA will remain in effec t (meaning this test can be used) for the duration of  the COVID-19 declaration under Section 564(b)(1) of the Act, 21 U.S.C. section 360bbb-3(b)(1), unless the authorization is terminated or revoked sooner. Performed at [facility], [place] [address] [place], [place] [address]   Troponin I - ONCE - STAT     Status: Abnormal   Collection Time: 12/28/18  3:04 AM  Result Value Ref Range   Troponin I 0.07 (HH) <0.03 ng/mL    Comment: CRITICAL VALUE NOTED.  VALUE IS CONSISTENT WITH PREVIOUSLY REPORTED AND CALLED VALUE. Performed at [facility], [place] [address] [place], [place] [address]   CBC     Status: Abnormal   Collection Time: 12/28/18  3:21 AM  Result Value Ref Range   WBC 6.9 4.0 - 10.5 K/uL   RBC 3.95 (L) 4.22 - 5.81 MIL/uL   Hemoglobin 11.0 (L) 13.0 - 17.0 g/dL   HCT 31.5 (L) 39.0 - 52.0 %   MCV 79.7 (L)  80.0 - 100.0 fL   MCH 27.8 26.0 - 34.0 pg   MCHC 34.9 30.0 - 36.0 g/dL   RDW 16.1 (H) 11.5 - 15.5 %   Platelets 111 (L) 150 - 400 K/uL    Comment: REPEATED TO VERIFY Immature Platelet Fraction may be clinically indicated, consider ordering this additional test [id number] CONSISTENT WITH PREVIOUS RESULT    nRBC 0.3 (H) 0.0 - 0.2 %    Comment: Performed at [facility], [place] [address] [place], [place] [address]  Basic metabolic panel     Status: Abnormal   Collection Time: 12/28/18  3:21 AM  Result Value Ref Range   Sodium 135 135 - 145 mmol/L   Potassium 6.0 (H) 3.5 - 5.1 mmol/L   Chloride 95 (L) 98 - 111 mmol/L   CO2 21 (L) 22 - 32 mmol/L   Glucose, Bld 74 70 - 99 mg/dL   BUN 36 (H) 6 - 20 mg/dL   Creatinine, Ser 8.49 (H) 0.61 - 1.24 mg/dL   Calcium 9.6 8.9 - 10.3 mg/dL   GFR calc non Af Amer 6 (L) >60 mL/min   GFR calc Af Amer 7 (L) >60 mL/min   Anion gap 19 (H) 5 - 15    Comment: Performed at [facility] [address], [place], [place] [address]  Troponin I - Now Then Q6H     Status: Abnormal  Collection Time: 12/28/18  3:21 AM  Result Value Ref Range   Troponin I 0.07 (HH) <0.03 ng/mL    Comment: CRITICAL VALUE NOTED.  VALUE IS CONSISTENT WITH PREVIOUSLY REPORTED AND CALLED VALUE. Performed at [facility], [place] [address] [place], [place] [address]   Magnesium     Status: None   Collection Time: 12/28/18  3:21 AM  Result Value Ref Range   Magnesium 2.1 1.7 - 2.4 mg/dL    Comment: Performed at [facility] [address], [place], [place] [address]  CBG monitoring, ED     Status: None   Collection Time: 12/28/18  4:13 AM  Result Value Ref Range   Glucose-Capillary 75 70 - 99 mg/dL   Dg Chest 2 View  Result Date: 12/27/2018 CLINICAL DATA:  Shortness of breath EXAM: CHEST - 2 VIEW COMPARISON:  12/10/2018, 09/21/2018, 03/10/2018 FINDINGS: Mild elevation of left diaphragm. No pleural effusion. Cardiomegaly with vascular congestion. Aortic  atherosclerosis. No pneumothorax. IMPRESSION: Cardiomegaly with vascular congestion. Electronically Signed   By: Donavan Foil M.D.   On: 12/27/2018 21:50    Pending Labs Unresulted Labs (From admission, onward)    Start     Ordered   12/28/18 0500  Potassium  Tomorrow morning,   R     12/28/18 0410   12/28/18 0308  Troponin I - Now Then Q6H  Now then every 6 hours,   R (with STAT occurrences)     12/28/18 0309          Vitals/Pain Today's Vitals   12/28/18 0045 12/28/18 0100 12/28/18 0200 12/28/18 0510  BP: (!) 157/75 (!) 170/95 (!) 169/91 (!) 161/72  Pulse: 84 91  91  Resp: 15 14 16 20   Temp:    98.1 F (36.7 C)  TempSrc:    Oral  SpO2: 100% 100%  97%  Weight:      Height:      PainSc:        Isolation Precautions No active isolations  Medications Medications  Chlorhexidine Gluconate Cloth 2 % PADS 6 each (has no administration in time range)  heparin injection 5,000 Units (has no administration in time range)  acetaminophen (TYLENOL) tablet 650 mg (has no administration in time range)    Or  acetaminophen (TYLENOL) suppository 650 mg (has no administration in time range)  cefTRIAXone (ROCEPHIN) 2 g in sodium chloride 0.9 % 100 mL IVPB (2 g Intravenous New Bag/Given 12/28/18 0423)  insulin aspart (novoLOG) injection 5 Units (0 Units Subcutaneous Hold 12/28/18 0423)  hydrALAZINE (APRESOLINE) injection 5 mg (has no administration in time range)  ipratropium-albuterol (DUONEB) 0.5-2.5 (3) MG/3ML nebulizer solution 3 mL (has no administration in time range)  metoprolol tartrate (LOPRESSOR) tablet 25 mg (has no administration in time range)  sodium bicarbonate injection 25 mEq (has no administration in time range)  sodium chloride flush (NS) 0.9 % injection 3 mL (3 mLs Intravenous Given 12/28/18 0423)  sodium zirconium cyclosilicate (LOKELMA) packet 10 g (10 g Oral Given 12/28/18 0253)  dextrose 50 % solution 50 mL (50 mLs Intravenous Given 12/28/18 0422)   oxyCODONE-acetaminophen (PERCOCET/ROXICET) 5-325 MG per tablet 1 tablet (1 tablet Oral Given 12/28/18 0431)    Mobility walks Low fall risk   Focused Assessments Pulmonary Assessment Handoff: lung sounds clear  Lung sounds:   O2 Device: Room Air O2 Flow Rate (L/min): 97 L/min      R Recommendations: See Admitting Provider Note  Report given to:   Additional  Notes:

## 2018-12-28 NOTE — Progress Notes (Signed)
Patient has removed restricted extremity bracelet and patient's bracelet.  Pt refuses bed alarm on. Safety education given and patient verbalizes understanding, however continues to refuse bracelets on. Will let coming shift nurse know.

## 2018-12-28 NOTE — Procedures (Signed)
Patient seen and examined on Hemodialysis. BP (!) 170/82 (BP Location: Left Leg)   Pulse 71   Temp 97.8 F (36.6 C) (Oral)   Resp 20   Ht 5\' 9"  (1.753 m)   Wt 74.7 kg   SpO2 99%   BMI 24.32 kg/m   QB 400 mL/ min, UF goal 5L  Tolerating treatment without complaints at this time.  Will plan for HD on schedule tomorrow.     Madelon Lips MD [facility] pgr [phone] 8:17 AM

## 2018-12-28 NOTE — ED Provider Notes (Signed)
Patient seen/examined in the Emergency Department in conjunction with Advanced Practice Provider [place] Patient reports sob and abd distention Exam : awake/alert, no distress. Abd distention noted Plan: pt noted to be hyperkalemic, will need admission/dialysis     Ripley Fraise, MD 12/28/18 0159

## 2018-12-28 NOTE — Consult Note (Signed)
Reason for Consult: To manage dialysis and dialysis related needs Referring Physician:  ED  [name] is an 58 y.o. male with PMhx significant for HTN, substance abuse , hepatitis B/C with ascites and ESRD with chronic non compliance and of late many hospitalizations- last in March 2020.  He presents to the ER tonight with c/o SOB and increased abdominal distention- found to have a K of 6.  Of note, ran only 39 minutes of HD on 6/17, left out 2.5 kg over EDW- ran close to full time on 6/15 but 18 minutes on 6/12.  In ER found to have K of 6.  He is in some mild discomfort but resting   Dialyzes at [facility]- MWF- 4 hours  EDW 70.5. HD [place] 2/2, Dialyzer 180, Heparin NO. Access AVG.  Calcitriol 3 mcg per tx and parsabiv 7.5.  Last labs hgb 11.3, phos 3.6, calc 9.5, pth 535 and alb 4.4  Past Medical History:  Diagnosis Date  . Anemia   . Anxiety   . Arthritis    left shoulder  . Atherosclerosis of aorta ([place])   . Cardiomegaly   . Chest pain    DATE UNKNOWN, C/O PERIODICALLY  . Cocaine abuse ([place])   . COPD exacerbation ([place]) 08/17/2016  . Coronary artery disease    stent 02/22/17  . ESRD (end stage renal disease) on dialysis ([place])    "[name]; MWF" (07/04/2017)  . GERD (gastroesophageal reflux disease)    DATE UNKNOWN  . Hemorrhoids   . Hepatitis B, chronic ([place])   . Hepatitis C   . History of kidney stones   . Hyperkalemia   . Hypertension   . Kidney failure   . Metabolic bone disease    Patient denies  . Mitral stenosis   . Myocardial infarction ([place])   . Pneumonia   . Pulmonary edema   . Solitary rectal ulcer syndrome 07/2017   at flex sig for rectal bleeding  . Tubular adenoma of colon     Past Surgical History:  Procedure Laterality Date  . A/V FISTULAGRAM Left 05/26/2017   Procedure: A/V FISTULAGRAM;  Surgeon: Conrad Kenton, MD;  Location: [place] [facility];  Service: Cardiovascular;  Laterality: Left;  . A/V FISTULAGRAM Right 11/18/2017   Procedure: A/V  FISTULAGRAM - Right Arm;  Surgeon: Elam Dutch, MD;  Location: [place] [facility];  Service: Cardiovascular;  Laterality: Right;  . APPLICATION OF WOUND VAC Left 06/14/2017   Procedure: APPLICATION OF WOUND VAC;  Surgeon: Katha Cabal, MD;  Location: [facility] ORS;  Service: Vascular;  Laterality: Left;  . AV FISTULA PLACEMENT  2012   BELIEVED WAS PLACED IN JUNE  . AV FISTULA PLACEMENT Right 08/09/2017   Procedure: Creation Right arm ARTERIOVENOUS BRACHIOCEPOHALIC FISTULA;  Surgeon: Elam Dutch, MD;  Location: [facility] OR;  Service: Vascular;  Laterality: Right;  . AV FISTULA PLACEMENT Right 11/22/2017   Procedure: INSERTION OF ARTERIOVENOUS (AV) GORE-TEX GRAFT RIGHT UPPER ARM;  Surgeon: Elam Dutch, MD;  Location: [place];  Service: Vascular;  Laterality: Right;  . BIOPSY  01/25/2018   Procedure: BIOPSY;  Surgeon: Jerene Bears, MD;  Location: [facility] ENDOSCOPY;  Service: Gastroenterology;;  . COLONOSCOPY    . COLONOSCOPY WITH PROPOFOL N/A 01/25/2018   Procedure: COLONOSCOPY WITH PROPOFOL;  Surgeon: Jerene Bears, MD;  Location: [place];  Service: Gastroenterology;  Laterality: N/A;  . CORONARY STENT INTERVENTION N/A 02/22/2017   Procedure: CORONARY STENT INTERVENTION;  Surgeon: Virgina Jock,  Reynold Bowen, MD;  Location: [place] [facility];  Service: Cardiovascular;  Laterality: N/A;  . ESOPHAGOGASTRODUODENOSCOPY (EGD) WITH PROPOFOL N/A 01/25/2018   Procedure: ESOPHAGOGASTRODUODENOSCOPY (EGD) WITH PROPOFOL;  Surgeon: Jerene Bears, MD;  Location: [place];  Service: Gastroenterology;  Laterality: N/A;  . FLEXIBLE SIGMOIDOSCOPY N/A 07/15/2017   Procedure: FLEXIBLE SIGMOIDOSCOPY;  Surgeon: Carol [place], MD;  Location: [place];  Service: Endoscopy;  Laterality: N/A;  . HEMORRHOID BANDING    . I&D EXTREMITY Left 06/01/2017   Procedure: IRRIGATION AND DEBRIDEMENT LEFT ARM HEMATOMA WITH LIGATION OF LEFT ARM AV FISTULA;  Surgeon: Elam Dutch, MD;  Location: [place];  Service: Vascular;   Laterality: Left;  . I&D EXTREMITY Left 06/14/2017   Procedure: IRRIGATION AND DEBRIDEMENT EXTREMITY;  Surgeon: Katha Cabal, MD;  Location: [facility] ORS;  Service: Vascular;  Laterality: Left;  . INSERTION OF DIALYSIS CATHETER  05/30/2017  . INSERTION OF DIALYSIS CATHETER N/A 05/30/2017   Procedure: INSERTION OF DIALYSIS CATHETER;  Surgeon: Elam Dutch, MD;  Location: [place];  Service: Vascular;  Laterality: N/A;  . IR PARACENTESIS  08/30/2017  . IR PARACENTESIS  09/29/2017  . IR PARACENTESIS  10/28/2017  . IR PARACENTESIS  11/09/2017  . IR PARACENTESIS  11/16/2017  . IR PARACENTESIS  11/28/2017  . IR PARACENTESIS  12/01/2017  . IR PARACENTESIS  12/06/2017  . IR PARACENTESIS  01/03/2018  . IR PARACENTESIS  01/23/2018  . IR PARACENTESIS  02/07/2018  . IR PARACENTESIS  02/21/2018  . IR PARACENTESIS  03/06/2018  . IR PARACENTESIS  03/17/2018  . IR PARACENTESIS  04/04/2018  . IR RADIOLOGIST EVAL & MGMT  02/14/2018  . LEFT HEART CATH AND CORONARY ANGIOGRAPHY N/A 02/22/2017   Procedure: LEFT HEART CATH AND CORONARY ANGIOGRAPHY;  Surgeon: Nigel Mormon, MD;  Location: [place] [facility];  Service: Cardiovascular;  Laterality: N/A;  . LIGATION OF ARTERIOVENOUS  FISTULA Left 03/14/7901   Procedure: Plication of Left Arm Arteriovenous Fistula;  Surgeon: Elam Dutch, MD;  Location: [place];  Service: Vascular;  Laterality: Left;  . POLYPECTOMY    . POLYPECTOMY  01/25/2018   Procedure: POLYPECTOMY;  Surgeon: Jerene Bears, MD;  Location: [place];  Service: Gastroenterology;;  . REVISON OF ARTERIOVENOUS FISTULA Left 10/19/7351   Procedure: PLICATION OF DISTAL ANEURYSMAL SEGEMENT OF LEFT UPPER ARM ARTERIOVENOUS FISTULA;  Surgeon: Elam Dutch, MD;  Location: [place];  Service: Vascular;  Laterality: Left;  . REVISON OF ARTERIOVENOUS FISTULA Left 2/99/2426   Procedure: Plication of Left Upper Arm Fistula ;  Surgeon: Waynetta [place], MD;  Location: [place];  Service: Vascular;  Laterality:  Left;  . SKIN GRAFT SPLIT THICKNESS LEG / FOOT Left    SKIN GRAFT SPLIT THICKNESS LEFT ARM DONOR SITE: LEFT ANTERIOR THIGH  . SKIN SPLIT GRAFT Left 07/04/2017   Procedure: SKIN GRAFT SPLIT THICKNESS LEFT ARM DONOR SITE: LEFT ANTERIOR THIGH;  Surgeon: Elam Dutch, MD;  Location: [place];  Service: Vascular;  Laterality: Left;  . THROMBECTOMY W/ EMBOLECTOMY Left 06/05/2017   Procedure: EXPLORATION OF LEFT ARM FOR BLEEDING; OVERSEWED PROXIMAL FISTULA;  Surgeon: Angelia Mould, MD;  Location: [place];  Service: Vascular;  Laterality: Left;  . WOUND EXPLORATION Left 06/03/2017   Procedure: WOUND EXPLORATION WITH WOUND VAC APPLICATION TO LEFT ARM;  Surgeon: Angelia Mould, MD;  Location: [facility] OR;  Service: Vascular;  Laterality: Left;    Family History  Problem Relation Age of Onset  . Heart disease  Mother   . Lung cancer Mother   . Heart disease Father   . Malignant hyperthermia Father   . COPD Father   . Throat cancer Sister   . Esophageal cancer Sister   . Hypertension Other   . COPD Other   . Colon cancer Neg Hx   . Colon polyps Neg Hx   . Rectal cancer Neg Hx   . Stomach cancer Neg Hx     Social History:  reports that he has been smoking cigarettes. He started smoking about 45 years ago. He has a 21.50 pack-year smoking history. He has never used smokeless tobacco. He reports previous alcohol use. He reports current drug use. Drug: Marijuana.  Allergies:  Allergies  Allergen Reactions  . Morphine And Related Other (See Comments)    Stomach pain  . Aspirin Other (See Comments)    STOMACH PAIN  . Clonidine Derivatives Itching  . Tramadol Itching  . Tylenol [Acetaminophen] Nausea Only    Stomach ache    Medications: I have reviewed the patient's current medications.   Results for orders placed or performed during the hospital encounter of 12/27/18 (from the past 48 hour(s))  Basic metabolic panel     Status: Abnormal   Collection Time: 12/27/18  9:34 PM   Result Value Ref Range   Sodium 136 135 - 145 mmol/L   Potassium 6.0 (H) 3.5 - 5.1 mmol/L    Comment: NO VISIBLE HEMOLYSIS   Chloride 97 (L) 98 - 111 mmol/L   CO2 22 22 - 32 mmol/L   Glucose, Bld 83 70 - 99 mg/dL   BUN 35 (H) 6 - 20 mg/dL   Creatinine, Ser 8.18 (H) 0.61 - 1.24 mg/dL   Calcium 9.8 8.9 - 10.3 mg/dL   GFR calc non Af Amer 7 (L) >60 mL/min   GFR calc Af Amer 8 (L) >60 mL/min   Anion gap 17 (H) 5 - 15    Comment: Performed at [facility], [address], [place], [place] [address]  CBC     Status: Abnormal   Collection Time: 12/27/18  9:34 PM  Result Value Ref Range   WBC 7.6 4.0 - 10.5 K/uL   RBC 4.03 (L) 4.22 - 5.81 MIL/uL   Hemoglobin 11.3 (L) 13.0 - 17.0 g/dL   HCT 32.4 (L) 39.0 - 52.0 %   MCV 80.4 80.0 - 100.0 fL   MCH 28.0 26.0 - 34.0 pg   MCHC 34.9 30.0 - 36.0 g/dL   RDW 16.0 (H) 11.5 - 15.5 %   Platelets 114 (L) 150 - 400 K/uL    Comment: REPEATED TO VERIFY PLATELET COUNT CONFIRMED BY SMEAR Immature Platelet Fraction may be clinically indicated, consider ordering this additional test [id number]    nRBC 0.0 0.0 - 0.2 %    Comment: Performed at [facility], [place] [address], [place], [place] [address]  Troponin I - ONCE - STAT     Status: Abnormal   Collection Time: 12/27/18  9:34 PM  Result Value Ref Range   Troponin I 0.07 (HH) <0.03 ng/mL    Comment: CRITICAL RESULT CALLED TO, READ BACK BY AND VERIFIED WITH: PHILLIPS T,RN 12/27/18 2238 WAYK Performed at [facility], [place] [address], [place], [place] [address]     Dg Chest 2 View  Result Date: 12/27/2018 CLINICAL DATA:  Shortness of breath EXAM: CHEST - 2 VIEW COMPARISON:  12/10/2018, 09/21/2018, 03/10/2018 FINDINGS: Mild elevation of left diaphragm. No pleural  effusion. Cardiomegaly with vascular congestion. Aortic atherosclerosis. No pneumothorax. IMPRESSION: Cardiomegaly with vascular congestion. Electronically Signed   By: Donavan Foil M.D.   On: 12/27/2018 21:50    ROS:  positive for SOB and increased abdominal distention  Blood pressure (!) 170/95, pulse 91, temperature 97.6 F (36.4 C), temperature source Oral, resp. rate 14, height 5\' 9"  (1.753 m), weight 72.6 kg, SpO2 100 %. General appearance: alert and cooperative Resp: diminished breath sounds bibasilar Cardio: regular rate and rhythm, S1, S2 normal, no murmur, click, rub or gallop GI: distention due to ascites  Extremities: extremities normal, atraumatic, no cyanosis or edema right upper AVG- patent   Assessment/Plan: 58 year old BM with multiple medical problems including ESRD and non compliance- presenting with SOB and hyperkalemia 1 SOB/hyperkalemia-  Due to noncompliance with dialysis.  Will plan on HD first shift this AM 2 ESRD: normally MWF -  Often runs very short treatments.  Will get HD this AM- Thursday and would then benefit from another treatment either Friday or Saturday via AVG 3 Hypertension: is the norm for him- on med list is hydralazine and diltiazem.  Also UF with HD 4. Anemia of ESRD: not a major issue right now-  Normally not on ESA 5. Metabolic Bone Disease: last labs not bad.  Will continue home doses of calcitriol and renvela  Cannot give parsabiv here at the hospital 6.  Hep b/c /cirrhosis-  Has been getting paracentesis- may need one now , per primary team    Louis Meckel 12/28/2018, 2:32 AM

## 2018-12-28 NOTE — Progress Notes (Signed)
Renal Navigator notified OP HD clinic/East of patient's admission and negative COVID 19 rapid test result in order to provide continuity of care and safety.  Ova Meegan Elizabeth, LCSW Renal Navigator [phone] 

## 2018-12-28 NOTE — ED Notes (Signed)
ED TO INPATIENT HANDOFF REPORT  ED Nurse Name and Phone #: [phone]  S Name/Age/Gender [name] 58 y.o. male Room/Bed: 031C/031C  Code Status   Code Status: Prior  Home/SNF/Other Home Patient oriented to: self, place, time and situation Is this baseline? Yes   Triage Complete: Triage complete  Chief Complaint SOB  Triage Note BIB EMS from home. Pt reports SOB, abd distension X few days. Dialysis, MWF, was unable to complete full dialysis treatment today. States he is scheduled to have a paracentesis in a few days.    Allergies Allergies  Allergen Reactions  . Morphine And Related Other (See Comments)    Stomach pain  . Aspirin Other (See Comments)    STOMACH PAIN  . Clonidine Derivatives Itching  . Tramadol Itching  . Tylenol [Acetaminophen] Nausea Only    Stomach ache    Level of Care/Admitting Diagnosis ED Disposition    ED Disposition Condition [facility] Area: [place] [[id number]]  Level of Care: Telemetry Cardiac [103]  I expect the patient will be discharged within 24 hours: No (not a candidate for 5C-Observation unit)  Covid Evaluation: Person Under Investigation (PUI)  Isolation Risk Level: Low Risk/Droplet (Less than 4L [place] supplementation)  Diagnosis: Volume overload [093267]  Admitting Physician: Shela Leff [[id number]]  Attending Physician: Shela Leff [[id number]]  PT Class (Do Not Modify): Observation [104]  PT Acc Code (Do Not Modify): Observation [10022]       B Medical/Surgery History Past Medical History:  Diagnosis Date  . Anemia   . Anxiety   . Arthritis    left shoulder  . Atherosclerosis of aorta ([place])   . Cardiomegaly   . Chest pain    DATE UNKNOWN, C/O PERIODICALLY  . Cocaine abuse ([place])   . COPD exacerbation ([place]) 08/17/2016  . Coronary artery disease    stent 02/22/17  . ESRD (end stage renal disease) on dialysis ([place])    "[name]; MWF" (07/04/2017)  . GERD  (gastroesophageal reflux disease)    DATE UNKNOWN  . Hemorrhoids   . Hepatitis B, chronic ([place])   . Hepatitis C   . History of kidney stones   . Hyperkalemia   . Hypertension   . Kidney failure   . Metabolic bone disease    Patient denies  . Mitral stenosis   . Myocardial infarction ([place])   . Pneumonia   . Pulmonary edema   . Solitary rectal ulcer syndrome 07/2017   at flex sig for rectal bleeding  . Tubular adenoma of colon    Past Surgical History:  Procedure Laterality Date  . A/V FISTULAGRAM Left 05/26/2017   Procedure: A/V FISTULAGRAM;  Surgeon: Conrad [place], MD;  Location: [place] [facility];  Service: Cardiovascular;  Laterality: Left;  . A/V FISTULAGRAM Right 11/18/2017   Procedure: A/V FISTULAGRAM - Right Arm;  Surgeon: Elam Dutch, MD;  Location: [place] [facility];  Service: Cardiovascular;  Laterality: Right;  . APPLICATION OF WOUND VAC Left 06/14/2017   Procedure: APPLICATION OF WOUND VAC;  Surgeon: Katha Cabal, MD;  Location: [facility] ORS;  Service: Vascular;  Laterality: Left;  . AV FISTULA PLACEMENT  2012   BELIEVED WAS PLACED IN JUNE  . AV FISTULA PLACEMENT Right 08/09/2017   Procedure: Creation Right arm ARTERIOVENOUS BRACHIOCEPOHALIC FISTULA;  Surgeon: Elam Dutch, MD;  Location: [place];  Service: Vascular;  Laterality: Right;  . AV FISTULA PLACEMENT Right 11/22/2017   Procedure: INSERTION  OF ARTERIOVENOUS (AV) GORE-TEX GRAFT RIGHT UPPER ARM;  Surgeon: Elam Dutch, MD;  Location: [place];  Service: Vascular;  Laterality: Right;  . BIOPSY  01/25/2018   Procedure: BIOPSY;  Surgeon: Jerene Bears, MD;  Location: [facility] ENDOSCOPY;  Service: Gastroenterology;;  . COLONOSCOPY    . COLONOSCOPY WITH PROPOFOL N/A 01/25/2018   Procedure: COLONOSCOPY WITH PROPOFOL;  Surgeon: Jerene Bears, MD;  Location: [place];  Service: Gastroenterology;  Laterality: N/A;  . CORONARY STENT INTERVENTION N/A 02/22/2017   Procedure: CORONARY STENT INTERVENTION;  Surgeon:  Nigel Mormon, MD;  Location: [place] [facility];  Service: Cardiovascular;  Laterality: N/A;  . ESOPHAGOGASTRODUODENOSCOPY (EGD) WITH PROPOFOL N/A 01/25/2018   Procedure: ESOPHAGOGASTRODUODENOSCOPY (EGD) WITH PROPOFOL;  Surgeon: Jerene Bears, MD;  Location: [place];  Service: Gastroenterology;  Laterality: N/A;  . FLEXIBLE SIGMOIDOSCOPY N/A 07/15/2017   Procedure: FLEXIBLE SIGMOIDOSCOPY;  Surgeon: Carol [place], MD;  Location: [place];  Service: Endoscopy;  Laterality: N/A;  . HEMORRHOID BANDING    . I&D EXTREMITY Left 06/01/2017   Procedure: IRRIGATION AND DEBRIDEMENT LEFT ARM HEMATOMA WITH LIGATION OF LEFT ARM AV FISTULA;  Surgeon: Elam Dutch, MD;  Location: [place];  Service: Vascular;  Laterality: Left;  . I&D EXTREMITY Left 06/14/2017   Procedure: IRRIGATION AND DEBRIDEMENT EXTREMITY;  Surgeon: Katha Cabal, MD;  Location: [facility]S;  Service: Vascular;  Laterality: Left;  . INSERTION OF DIALYSIS CATHETER  05/30/2017  . INSERTION OF DIALYSIS CATHETER N/A 05/30/2017   Procedure: INSERTION OF DIALYSIS CATHETER;  Surgeon: Elam Dutch, MD;  Location: [place];  Service: Vascular;  Laterality: N/A;  . IR PARACENTESIS  08/30/2017  . IR PARACENTESIS  09/29/2017  . IR PARACENTESIS  10/28/2017  . IR PARACENTESIS  11/09/2017  . IR PARACENTESIS  11/16/2017  . IR PARACENTESIS  11/28/2017  . IR PARACENTESIS  12/01/2017  . IR PARACENTESIS  12/06/2017  . IR PARACENTESIS  01/03/2018  . IR PARACENTESIS  01/23/2018  . IR PARACENTESIS  02/07/2018  . IR PARACENTESIS  02/21/2018  . IR PARACENTESIS  03/06/2018  . IR PARACENTESIS  03/17/2018  . IR PARACENTESIS  04/04/2018  . IR RADIOLOGIST EVAL & MGMT  02/14/2018  . LEFT HEART CATH AND CORONARY ANGIOGRAPHY N/A 02/22/2017   Procedure: LEFT HEART CATH AND CORONARY ANGIOGRAPHY;  Surgeon: Nigel Mormon, MD;  Location: [place] [facility];  Service: Cardiovascular;  Laterality: N/A;  . LIGATION OF ARTERIOVENOUS  FISTULA Left 02/13/6313    Procedure: Plication of Left Arm Arteriovenous Fistula;  Surgeon: Elam Dutch, MD;  Location: [place];  Service: Vascular;  Laterality: Left;  . POLYPECTOMY    . POLYPECTOMY  01/25/2018   Procedure: POLYPECTOMY;  Surgeon: Jerene Bears, MD;  Location: [place];  Service: Gastroenterology;;  . REVISON OF ARTERIOVENOUS FISTULA Left 9/70/2637   Procedure: PLICATION OF DISTAL ANEURYSMAL SEGEMENT OF LEFT UPPER ARM ARTERIOVENOUS FISTULA;  Surgeon: Elam Dutch, MD;  Location: [place];  Service: Vascular;  Laterality: Left;  . REVISON OF ARTERIOVENOUS FISTULA Left 8/58/8502   Procedure: Plication of Left Upper Arm Fistula ;  Surgeon: Waynetta [place], MD;  Location: [place];  Service: Vascular;  Laterality: Left;  . SKIN GRAFT SPLIT THICKNESS LEG / FOOT Left    SKIN GRAFT SPLIT THICKNESS LEFT ARM DONOR SITE: LEFT ANTERIOR THIGH  . SKIN SPLIT GRAFT Left 07/04/2017   Procedure: SKIN GRAFT SPLIT THICKNESS LEFT ARM DONOR SITE: LEFT ANTERIOR THIGH;  Surgeon: Ruta Hinds  E, MD;  Location: [place];  Service: Vascular;  Laterality: Left;  . THROMBECTOMY W/ EMBOLECTOMY Left 06/05/2017   Procedure: EXPLORATION OF LEFT ARM FOR BLEEDING; OVERSEWED PROXIMAL FISTULA;  Surgeon: Angelia Mould, MD;  Location: [place];  Service: Vascular;  Laterality: Left;  . WOUND EXPLORATION Left 06/03/2017   Procedure: WOUND EXPLORATION WITH WOUND VAC APPLICATION TO LEFT ARM;  Surgeon: Angelia Mould, MD;  Location: [facility] OR;  Service: Vascular;  Laterality: Left;     A IV Location/Drains/Wounds Patient Lines/Drains/Airways Status   Active Line/Drains/Airways    Name:   Placement date:   Placement time:   Site:   Days:   Fistula / Graft Right Upper arm Arteriovenous vein graft   11/22/17    0836    Upper arm   401   Fistula / Graft Right Upper arm   -    -    Upper arm      Hemodialysis Catheter Right Subclavian Double-lumen   05/30/17    1734    Subclavian   577          Intake/Output Last 24  hours No intake or output data in the 24 hours ending 12/28/18 0254  Labs/Imaging Results for orders placed or performed during the hospital encounter of 12/27/18 (from the past 48 hour(s))  Basic metabolic panel     Status: Abnormal   Collection Time: 12/27/18  9:34 PM  Result Value Ref Range   Sodium 136 135 - 145 mmol/L   Potassium 6.0 (H) 3.5 - 5.1 mmol/L    Comment: NO VISIBLE HEMOLYSIS   Chloride 97 (L) 98 - 111 mmol/L   CO2 22 22 - 32 mmol/L   Glucose, Bld 83 70 - 99 mg/dL   BUN 35 (H) 6 - 20 mg/dL   Creatinine, Ser 8.18 (H) 0.61 - 1.24 mg/dL   Calcium 9.8 8.9 - 10.3 mg/dL   GFR calc non Af Amer 7 (L) >60 mL/min   GFR calc Af Amer 8 (L) >60 mL/min   Anion gap 17 (H) 5 - 15    Comment: Performed at [facility], [address] [place], [place] [address]  CBC     Status: Abnormal   Collection Time: 12/27/18  9:34 PM  Result Value Ref Range   WBC 7.6 4.0 - 10.5 K/uL   RBC 4.03 (L) 4.22 - 5.81 MIL/uL   Hemoglobin 11.3 (L) 13.0 - 17.0 g/dL   HCT 32.4 (L) 39.0 - 52.0 %   MCV 80.4 80.0 - 100.0 fL   MCH 28.0 26.0 - 34.0 pg   MCHC 34.9 30.0 - 36.0 g/dL   RDW 16.0 (H) 11.5 - 15.5 %   Platelets 114 (L) 150 - 400 K/uL    Comment: REPEATED TO VERIFY PLATELET COUNT CONFIRMED BY SMEAR Immature Platelet Fraction may be clinically indicated, consider ordering this additional test [id number]    nRBC 0.0 0.0 - 0.2 %    Comment: Performed at [facility], [place] [address], [place], [place] [address]  Troponin I - ONCE - STAT     Status: Abnormal   Collection Time: 12/27/18  9:34 PM  Result Value Ref Range   Troponin I 0.07 (HH) <0.03 ng/mL    Comment: CRITICAL RESULT CALLED TO, READ BACK BY AND VERIFIED WITH: PHILLIPS T,RN 12/27/18 2238 WAYK Performed at [facility], [place] [address], [place], [place] [address]    Dg Chest 2 View  Result Date:  12/27/2018 CLINICAL DATA:  Shortness of breath EXAM: CHEST - 2 VIEW COMPARISON:  12/10/2018, 09/21/2018, 03/10/2018  FINDINGS: Mild elevation of left diaphragm. No pleural effusion. Cardiomegaly with vascular congestion. Aortic atherosclerosis. No pneumothorax. IMPRESSION: Cardiomegaly with vascular congestion. Electronically Signed   By: Donavan Foil M.D.   On: 12/27/2018 21:50    Pending Labs Unresulted Labs (From admission, onward)    Start     Ordered   12/28/18 0232  Troponin I - ONCE - STAT  ONCE - STAT,   STAT     12/28/18 0231   12/28/18 0120  SARS Coronavirus 2 (CEPHEID - Performed in [place] hospital lab), Hosp Order  (Asymptomatic Patients Labs)  Once,   STAT    Question:  Rule Out  Answer:  Yes   12/28/18 0119          Vitals/Pain Today's Vitals   12/27/18 2129 12/27/18 2331 12/28/18 0045 12/28/18 0100  BP: (!) 156/106 134/67 (!) 157/75 (!) 170/95  Pulse: 100 83 84 91  Resp: 16 18 15 14   Temp: 97.8 F (36.6 C) 97.6 F (36.4 C)    TempSrc: Oral Oral    SpO2: 97% 99% 100% 100%  Weight:      Height:      PainSc:        Isolation Precautions No active isolations  Medications Medications  sodium chloride flush (NS) 0.9 % injection 3 mL (has no administration in time range)  Chlorhexidine Gluconate Cloth 2 % PADS 6 each (has no administration in time range)  sodium zirconium cyclosilicate (LOKELMA) packet 10 g (10 g Oral Given 12/28/18 0253)    Mobility walks Low fall risk   Focused Assessments Renal Assessment Handoff:  Hemodialysis Schedule: Hemodialysis Schedule: Monday/Wednesday/Friday Last Hemodialysis date and time: 12/27/2018   Restricted appendage: right arm     R Recommendations: See Admitting Provider Note  Report given to:   Additional Notes:

## 2018-12-28 NOTE — Progress Notes (Signed)
Hemodialysis- HD initiated via R AVG using 15g needles x2 without issue. Hep B +. Isolation initiated. Lungs diminished bilaterally, +ascites. HR irregular. Patient currently resting comfortably with sats 98% on 2L [place]. Does have dyspnea with minimal exertion. UF goal 5L as ordered. Will monitor closely.

## 2018-12-28 NOTE — Procedures (Signed)
  PROCEDURE SUMMARY:  Successful US guided paracentesis from RLQ.  Yielded 3.4 L of clear amber fluid.  No immediate complications.  Pt tolerated well.   Specimen was sent for labs.  EBL < 39mL  Ascencion Dike PA-C 12/28/2018 2:39 PM

## 2018-12-28 NOTE — ED Provider Notes (Signed)
[facility] EMERGENCY DEPARTMENT Provider Note   CSN: [id number] Arrival date & time: 12/27/18  2121    History   Chief Complaint Chief Complaint  Patient presents with  . Shortness of Breath    HPI [name] is a 59 y.o. male with history of ESRD on HD [place] M/WF), hepatitis, ascites secondary to alcoholic cirrhosis, paroxysmal atrial fibrillation and GERD who presents to the emergency department with a chief complaint of shortness of breath for the last few days.  He was placed on 2 L of oxygen via nasal cannula by EMS for comfort.  He does not wear home O2.  He reports that he is also been having diarrhea.  He reports that his dialysis session on 6/17 ended early because he was feeling short of breath.  He is uncertain of his last full dialysis session, but believes that may have been 5 days ago.  He reports that he is scheduled for a paracentesis in 2 days.  States of having a hard time completing dialysis because there is too much swelling in my abdomen.  He denies chest pain, cough, leg swelling, palpitations, nausea, vomiting, fever, chills, numbness, or weakness.  He reports that he had 2 L of fluid removed during his last paracentesis, but cannot recall when the procedure occurred.     The history is provided by the patient. The history is limited by the condition of the patient. No language interpreter was used.    Past Medical History:  Diagnosis Date  . Anemia   . Anxiety   . Arthritis    left shoulder  . Atherosclerosis of aorta ([place])   . Cardiomegaly   . Chest pain    DATE UNKNOWN, C/O PERIODICALLY  . Cocaine abuse ([place])   . COPD exacerbation ([place]) 08/17/2016  . Coronary artery disease    stent 02/22/17  . ESRD (end stage renal disease) on dialysis ([place])    "[name]; MWF" (07/04/2017)  . GERD (gastroesophageal reflux disease)    DATE UNKNOWN  . Hemorrhoids   . Hepatitis B, chronic ([place])   . Hepatitis C   . History of kidney  stones   . Hyperkalemia   . Hypertension   . Kidney failure   . Metabolic bone disease    Patient denies  . Mitral stenosis   . Myocardial infarction ([place])   . Pneumonia   . Pulmonary edema   . Solitary rectal ulcer syndrome 07/2017   at flex sig for rectal bleeding  . Tubular adenoma of colon     Patient Active Problem List   Diagnosis Date Noted  . Volume overload 12/28/2018  . Sepsis ([place]) 09/12/2018  . Atherosclerosis of native coronary artery of native heart without angina pectoris 03/11/2018  . Benign neoplasm of cecum   . Benign neoplasm of ascending colon   . Benign neoplasm of descending colon   . Benign neoplasm of rectum   . Paroxysmal atrial fibrillation ([place]) 01/23/2018  . Hx of colonic polyps 01/20/2018  . ESRD (end stage renal disease) ([place]) 11/21/2017  . GERD (gastroesophageal reflux disease) 11/16/2017  . Liver cirrhosis ([place]) 11/15/2017  . DNR (do not resuscitate)   . Palliative care by specialist   . Hyponatremia 11/04/2017  . SBP (spontaneous bacterial peritonitis) ([place]) 10/30/2017  . Liver disease, chronic 10/30/2017  . SOB (shortness of breath)   . Abdominal pain 10/28/2017  . Upper airway cough syndrome with flattening on f/v loop 10/13/17 c/w vcd 10/17/2017  .  Elevated diaphragm 10/13/2017  . Ileus ([place]) 09/29/2017  . Malnutrition of moderate degree 09/29/2017  . Sinus congestion 09/03/2017  . Symptomatic anemia 09/02/2017  . Other cirrhosis of liver ([place]) 09/02/2017  . Left bundle branch block 09/02/2017  . Mitral stenosis 09/02/2017  . Hematochezia 07/15/2017  . Wide-complex tachycardia ([place])   . Endotracheally intubated   . ESRD on dialysis ([place]) 07/04/2017  . Acute respiratory failure with hypoxia ([place]) 06/18/2017  . CKD (chronic kidney disease) stage V requiring chronic dialysis ([place]) 06/18/2017  . History of Cocaine abuse ([place]) 06/18/2017  . Hypertension 06/18/2017  . Infection of AV graft for dialysis ([place]) 06/18/2017  . Anxiety  06/18/2017  . Anemia due to chronic kidney disease 06/18/2017  . Atrial flutter with rapid ventricular response ([place]) 06/18/2017  . Personality disorder ([place]) 06/13/2017  . Cellulitis 06/12/2017  . Adjustment disorder with mixed anxiety and depressed mood 06/10/2017  . Suicidal ideation 06/10/2017  . Arm wound, left, sequela 06/10/2017  . Dyspnea on exertion 05/29/2017  . Tachycardia 05/29/2017  . Hyperkalemia 05/22/2017  . Acute metabolic encephalopathy   . Anemia 04/23/2017  . Ascites 04/23/2017  . COPD (chronic obstructive pulmonary disease) ([place]) 04/23/2017  . Acute on chronic respiratory failure with hypoxia ([place]) 03/25/2017  . Arrhythmia 03/25/2017  . COPD GOLD 0 with flattening on inps f/v  09/27/2016  . Essential hypertension 09/27/2016  . Fluid overload 08/30/2016  . COPD exacerbation ([place]) 08/17/2016  . Hypertensive urgency 08/17/2016  . Respiratory failure ([place]) 08/17/2016  . Problem with dialysis access ([place]) 07/23/2016  . Chronic hepatitis B ([place]) 03/05/2014  . Chronic hepatitis C without hepatic coma ([place]) 03/05/2014  . Internal hemorrhoids with bleeding, swelling and itching 03/05/2014  . Thrombocytopenia ([place]) 03/05/2014  . Chest pain 02/27/2014  . Alcohol abuse 04/14/2009  . Cigarette smoker 04/14/2009  . GANGLION CYST 04/14/2009    Past Surgical History:  Procedure Laterality Date  . A/V FISTULAGRAM Left 05/26/2017   Procedure: A/V FISTULAGRAM;  Surgeon: Conrad [place], MD;  Location: [place] [facility];  Service: Cardiovascular;  Laterality: Left;  . A/V FISTULAGRAM Right 11/18/2017   Procedure: A/V FISTULAGRAM - Right Arm;  Surgeon: Elam Dutch, MD;  Location: [place] [facility];  Service: Cardiovascular;  Laterality: Right;  . APPLICATION OF WOUND VAC Left 06/14/2017   Procedure: APPLICATION OF WOUND VAC;  Surgeon: Katha Cabal, MD;  Location: [facility] ORS;  Service: Vascular;  Laterality: Left;  . AV FISTULA PLACEMENT  2012   BELIEVED WAS PLACED  IN JUNE  . AV FISTULA PLACEMENT Right 08/09/2017   Procedure: Creation Right arm ARTERIOVENOUS BRACHIOCEPOHALIC FISTULA;  Surgeon: Elam Dutch, MD;  [facility];  Service: Vascular;  Laterality: Right;  . AV FISTULA PLACEMENT Right 11/22/2017   Procedure: INSERTION OF ARTERIOVENOUS (AV) GORE-TEX GRAFT RIGHT UPPER ARM;  Surgeon: Elam Dutch, MD;  Location: [place];  Service: Vascular;  Laterality: Right;  . BIOPSY  01/25/2018   Procedure: BIOPSY;  Surgeon: Jerene Bears, MD;  Location: [facility] ENDOSCOPY;  Service: Gastroenterology;;  . COLONOSCOPY    . COLONOSCOPY WITH PROPOFOL N/A 01/25/2018   Procedure: COLONOSCOPY WITH PROPOFOL;  Surgeon: Jerene Bears, MD;  Location: [place];  Service: Gastroenterology;  Laterality: N/A;  . CORONARY STENT INTERVENTION N/A 02/22/2017   Procedure: CORONARY STENT INTERVENTION;  Surgeon: Nigel Mormon, MD;  Location: [place] [facility];  Service: Cardiovascular;  Laterality: N/A;  . ESOPHAGOGASTRODUODENOSCOPY (EGD) WITH PROPOFOL N/A 01/25/2018  Procedure: ESOPHAGOGASTRODUODENOSCOPY (EGD) WITH PROPOFOL;  Surgeon: Jerene Bears, MD;  Location: [facility] ENDOSCOPY;  Service: Gastroenterology;  Laterality: N/A;  . FLEXIBLE SIGMOIDOSCOPY N/A 07/15/2017   Procedure: FLEXIBLE SIGMOIDOSCOPY;  Surgeon: Carol Ada, MD;  Location: [place];  Service: Endoscopy;  Laterality: N/A;  . HEMORRHOID BANDING    . I&D EXTREMITY Left 06/01/2017   Procedure: IRRIGATION AND DEBRIDEMENT LEFT ARM HEMATOMA WITH LIGATION OF LEFT ARM AV FISTULA;  Surgeon: Elam Dutch, MD;  Location: [place];  Service: Vascular;  Laterality: Left;  . I&D EXTREMITY Left 06/14/2017   Procedure: IRRIGATION AND DEBRIDEMENT EXTREMITY;  Surgeon: Katha Cabal, MD;  Location: [facility] ORS;  Service: Vascular;  Laterality: Left;  . INSERTION OF DIALYSIS CATHETER  05/30/2017  . INSERTION OF DIALYSIS CATHETER N/A 05/30/2017   Procedure: INSERTION OF DIALYSIS CATHETER;  Surgeon: Elam Dutch,  MD;  Location: [place];  Service: Vascular;  Laterality: N/A;  . IR PARACENTESIS  08/30/2017  . IR PARACENTESIS  09/29/2017  . IR PARACENTESIS  10/28/2017  . IR PARACENTESIS  11/09/2017  . IR PARACENTESIS  11/16/2017  . IR PARACENTESIS  11/28/2017  . IR PARACENTESIS  12/01/2017  . IR PARACENTESIS  12/06/2017  . IR PARACENTESIS  01/03/2018  . IR PARACENTESIS  01/23/2018  . IR PARACENTESIS  02/07/2018  . IR PARACENTESIS  02/21/2018  . IR PARACENTESIS  03/06/2018  . IR PARACENTESIS  03/17/2018  . IR PARACENTESIS  04/04/2018  . IR RADIOLOGIST EVAL & MGMT  02/14/2018  . LEFT HEART CATH AND CORONARY ANGIOGRAPHY N/A 02/22/2017   Procedure: LEFT HEART CATH AND CORONARY ANGIOGRAPHY;  Surgeon: Nigel Mormon, MD;  Location: [place] [facility];  Service: Cardiovascular;  Laterality: N/A;  . LIGATION OF ARTERIOVENOUS  FISTULA Left 11/10/7614   Procedure: Plication of Left Arm Arteriovenous Fistula;  Surgeon: Elam Dutch, MD;  Location: [place];  Service: Vascular;  Laterality: Left;  . POLYPECTOMY    . POLYPECTOMY  01/25/2018   Procedure: POLYPECTOMY;  Surgeon: Jerene Bears, MD;  Location: [place];  Service: Gastroenterology;;  . REVISON OF ARTERIOVENOUS FISTULA Left 0/73/7106   Procedure: PLICATION OF DISTAL ANEURYSMAL SEGEMENT OF LEFT UPPER ARM ARTERIOVENOUS FISTULA;  Surgeon: Elam Dutch, MD;  Location: [place];  Service: Vascular;  Laterality: Left;  . REVISON OF ARTERIOVENOUS FISTULA Left 2/69/4854   Procedure: Plication of Left Upper Arm Fistula ;  Surgeon: Waynetta Sandy, MD;  Location: [place];  Service: Vascular;  Laterality: Left;  . SKIN GRAFT SPLIT THICKNESS LEG / FOOT Left    SKIN GRAFT SPLIT THICKNESS LEFT ARM DONOR SITE: LEFT ANTERIOR THIGH  . SKIN SPLIT GRAFT Left 07/04/2017   Procedure: SKIN GRAFT SPLIT THICKNESS LEFT ARM DONOR SITE: LEFT ANTERIOR THIGH;  Surgeon: Elam Dutch, MD;  Location: [place];  Service: Vascular;  Laterality: Left;  . THROMBECTOMY W/ EMBOLECTOMY Left  06/05/2017   Procedure: EXPLORATION OF LEFT ARM FOR BLEEDING; OVERSEWED PROXIMAL FISTULA;  Surgeon: Angelia Mould, MD;  Location: [place];  Service: Vascular;  Laterality: Left;  . WOUND EXPLORATION Left 06/03/2017   Procedure: WOUND EXPLORATION WITH WOUND VAC APPLICATION TO LEFT ARM;  Surgeon: Angelia Mould, MD;  Location: [place];  Service: Vascular;  Laterality: Left;        Home Medications    Prior to Admission medications   Medication Sig Start Date End Date Taking? Authorizing Provider  albuterol (VENTOLIN HFA) 108 (90 Base) MCG/ACT inhaler Inhale 2 puffs into  the lungs every 6 (six) hours as needed for wheezing or shortness of breath.    [provider]  aspirin 81 MG EC tablet Take 1 tablet (81 mg total) by mouth daily. 03/12/18   Geradine Girt, DO  bisacodyl (DULCOLAX) 5 MG EC tablet Take 5 mg by mouth daily as needed for moderate constipation.    [provider]  hydrALAZINE (APRESOLINE) 100 MG tablet Take 1 tablet (100 mg total) by mouth 2 (two) times daily as needed. Taking after HD on Dialysis days Patient taking differently: Take 100 mg by mouth 2 (two) times daily as needed (high blood pressure). If needed on Monday, Wednesday, Friday take after dialysis 03/11/18   Geradine Girt, DO  lactulose (CHRONULAC) 10 GM/15ML solution Take 45 mLs (30 g total) by mouth 2 (two) times daily. 12/26/18   Esterwood, Amy S, PA-C  metoprolol tartrate (LOPRESSOR) 25 MG tablet Take 1 tablet (25 mg total) by mouth 2 (two) times daily. 03/11/18   Geradine Girt, DO  pantoprazole (PROTONIX) 40 MG tablet Take 1 tablet (40 mg total) by mouth See admin instructions. Take one tablet (40 mg) by mouth in the morning on Sunday, Tuesday, Thursday, Saturday, take one tablet (40 mg) after dialysis on Monday, Wednesday, Friday. May also take one tablet (40 mg) in the evening as needed for acid reflux. 12/26/18   Esterwood, Amy S, PA-C  sevelamer carbonate (RENVELA) 800 MG tablet  Take 4 tablets with meals  And 2 tablets twice daily with snacks Patient taking differently: Take 2,400-3,200 mg by mouth See admin instructions. Take 3 - 4 tablets (2400 - 3200 mg) up to five times daily with meals or snacks 01/22/18   Patrecia Pour, MD  sulfamethoxazole-trimethoprim (BACTRIM DS) 800-160 MG tablet Take 1 tablet by mouth 2 (two) times daily. 12/26/18   Esterwood, Amy S, PA-C  SYMBICORT 160-4.5 MCG/ACT inhaler Inhale 2 puffs into the lungs 2 (two) times a day. 11/30/18   [provider]  zolpidem (AMBIEN) 5 MG tablet Take 5 mg by mouth at bedtime. 12/08/18   [provider]    Family History Family History  Problem Relation Age of Onset  . Heart disease Mother   . Lung cancer Mother   . Heart disease Father   . Malignant hyperthermia Father   . COPD Father   . Throat cancer Sister   . Esophageal cancer Sister   . Hypertension Other   . COPD Other   . Colon cancer Neg Hx   . Colon polyps Neg Hx   . Rectal cancer Neg Hx   . Stomach cancer Neg Hx     Social History Social History   Tobacco Use  . Smoking status: Current Every Day Smoker    Packs/day: 0.50    Years: 43.00    Pack years: 21.50    Types: Cigarettes    Start date: 08/13/1973  . Smokeless tobacco: Never Used  Substance Use Topics  . Alcohol use: Not Currently    Frequency: Never    Comment: quit drinking in 2017  . Drug use: Yes    Types: Marijuana     Allergies   Morphine and related, Aspirin, Clonidine derivatives, Tramadol, and Tylenol [acetaminophen]   Review of Systems Review of Systems  Constitutional: Negative for appetite change and fever.  Respiratory: Positive for shortness of breath. Negative for cough.   Cardiovascular: Negative for chest pain, palpitations and leg swelling.  Gastrointestinal: Positive for diarrhea. Negative for  abdominal pain, nausea and vomiting.  Genitourinary: Negative for dysuria and urgency.  Musculoskeletal: Negative for back pain.  Skin:  Negative for rash.  Allergic/Immunologic: Negative for immunocompromised state.  Neurological: Negative for weakness and headaches.  Psychiatric/Behavioral: Negative for confusion.     Physical Exam Updated Vital Signs BP (!) 170/95   Pulse 91   Temp 97.6 F (36.4 C) (Oral)   Resp 14   Ht 5\' 9"  (1.753 m)   Wt 72.6 kg   SpO2 100%   BMI 23.63 kg/m   Physical Exam Vitals signs and nursing note reviewed.  Constitutional:      Appearance: He is well-developed.     Comments: Chronically ill appearing male.  Appears much older than stated age.  HENT:     Head: Normocephalic.  Eyes:     Conjunctiva/sclera: Conjunctivae normal.  Neck:     Musculoskeletal: Neck supple.  Cardiovascular:     Rate and Rhythm: Tachycardia present. Rhythm irregularly irregular.     Heart sounds: No murmur.  Pulmonary:     Effort: Pulmonary effort is normal.     Comments: Crackles in bilateral bases Abdominal:     General: There is no distension.     Palpations: Abdomen is soft.     Comments: Abdomen is distended, but soft.  Positive fluid wave.  No tenderness to palpation.  Musculoskeletal:     Right lower leg: No edema.     Left lower leg: No edema.  Skin:    General: Skin is warm and dry.  Neurological:     Mental Status: He is alert.  Psychiatric:        Behavior: Behavior normal.      ED Treatments / Results  Labs (all labs ordered are listed, but only abnormal results are displayed) Labs Reviewed  BASIC METABOLIC PANEL - Abnormal; Notable for the following components:      Result Value   Potassium 6.0 (*)    Chloride 97 (*)    BUN 35 (*)    Creatinine, Ser 8.18 (*)    GFR calc non Af Amer 7 (*)    GFR calc Af Amer 8 (*)    Anion gap 17 (*)    All other components within normal limits  CBC - Abnormal; Notable for the following components:   RBC 4.03 (*)    Hemoglobin 11.3 (*)    HCT 32.4 (*)    RDW 16.0 (*)    Platelets 114 (*)    All other components within normal  limits  TROPONIN I - Abnormal; Notable for the following components:   Troponin I 0.07 (*)    All other components within normal limits  SARS CORONAVIRUS 2  TROPONIN I  CBC  BASIC METABOLIC PANEL  TROPONIN I  TROPONIN I  TROPONIN I  MAGNESIUM    EKG EKG Interpretation  Date/Time:  Thursday December 28 2018 00:41:12 EDT Ventricular Rate:  98 PR Interval:    QRS Duration: 175 QT Interval:  459 QTC Calculation: 565 R Axis:   -172 Text Interpretation:    Atrial fibrillation Consider left ventricular hypertrophy Prolonged QT interval Abnormal ekg Interpretation limited secondary to artifact Confirmed by Ripley Fraise [phone]) on 12/28/2018 1:03:06 AM   Radiology Dg Chest 2 View  Result Date: 12/27/2018 CLINICAL DATA:  Shortness of breath EXAM: CHEST - 2 VIEW COMPARISON:  12/10/2018, 09/21/2018, 03/10/2018 FINDINGS: Mild elevation of left diaphragm. No pleural effusion. Cardiomegaly with vascular congestion. Aortic atherosclerosis. No pneumothorax. IMPRESSION: Cardiomegaly  with vascular congestion. Electronically Signed   By: Donavan Foil M.D.   On: 12/27/2018 21:50    Procedures .Critical Care Performed by: Joanne Gavel, PA-C Authorized by: Joanne Gavel, PA-C   Critical care provider statement:    Critical care time (minutes):  35   Critical care time was exclusive of:  Separately billable procedures and treating other patients and teaching time   Critical care was necessary to treat or prevent imminent or life-threatening deterioration of the following conditions:  Metabolic crisis and renal failure   Critical care was time spent personally by me on the following activities:  Development of treatment plan with patient or surrogate, discussions with consultants, obtaining history from patient or surrogate, examination of patient, evaluation of patient's response to treatment, ordering and performing treatments and interventions, ordering and review of laboratory studies,  re-evaluation of patient's condition, pulse oximetry and review of old charts   (including critical care time)  Medications Ordered in ED Medications  sodium chloride flush (NS) 0.9 % injection 3 mL (has no administration in time range)  Chlorhexidine Gluconate Cloth 2 % PADS 6 each (has no administration in time range)  heparin injection 5,000 Units (has no administration in time range)  acetaminophen (TYLENOL) tablet 650 mg (has no administration in time range)    Or  acetaminophen (TYLENOL) suppository 650 mg (has no administration in time range)  cefTRIAXone (ROCEPHIN) 2 g in sodium chloride 0.9 % 100 mL IVPB (has no administration in time range)  sodium zirconium cyclosilicate (LOKELMA) packet 10 g (10 g Oral Given 12/28/18 0253)     Initial Impression / Assessment and Plan / ED Course  I have reviewed the triage vital signs and the nursing notes.  Pertinent labs & imaging results that were available during my care of the patient were reviewed by me and considered in my medical decision making (see chart for details).  Clinical Course as of Dec 28 326  Thu Dec 28, 2018  0136 Spoke with Dr. Moshe Cipro, nephrology, who will assist with getting the patient dialyzed. Will call for medical admission.    [MM]    Clinical Course User Index [MM] Michaeal Davis A, PA-C    58 year old male with history of ESRD on HD [place] M/WF), hepatitis, ascites secondary to alcoholic cirrhosis, paroxysmal atrial fibrillation and GERD who presents to the emergency department with shortness of breath.  He was placed on supplemental oxygen by EMS, had no hypoxia in the ER.  He is hypertensive, but otherwise hemodynamically stable.  Patient has been noncompliant with dialysis over the last few days and is unsure when his last full dialysis session was.  He was scheduled for an outpatient paracentesis later today.  Labs are notable for hyperkalemia of 6.0.  No significant changes in T waves on EKG.  EKG  with mild tachycardia and atrial fibrillation.  Troponin is also mildly elevated, which I suspect is secondary to worsening renal function as patient adamantly denies chest pain.   Lokelma given in the ER for hyperkalemia.  Although he is mildly tachycardic and endorsing shortness of breath with a history of atrial fibrillation not on anticoagulation, less likely suspect PE in the setting of volume overload, which was also demonstrated on chest x-ray.  Given electrolyte abnormalities, he will require emergent dialysis.  Spoke with Dr. Moshe Cipro who will coordinate dialysis.  Spoke with Dr. Marlowe Sax who will accept the patient for admission.  Test is pending.  The patient appears reasonably stabilized for  admission considering the current resources, flow, and capabilities available in the ED at this time, and I doubt any other [facility] requiring further screening and/or treatment in the ED prior to admission.       Final Clinical Impressions(s) / ED Diagnoses   Final diagnoses:  Hyperkalemia  Shortness of breath  Ascites due to alcoholic cirrhosis [facility])    ED Discharge Orders    None       Joanne Gavel, PA-C 12/28/18 0329    Ripley Fraise, MD 12/28/18 [phone]

## 2018-12-28 NOTE — H&P (Addendum)
History and Physical    [name] WHQ:[id number] DOB: [birth year] DOA: 12/27/2018  PCP: [name], FNP Patient coming from: Home  Chief Complaint: Shortness breath, abdominal distention  HPI: [name] is a 58 y.o. male with medical history significant of ESRD on HD MWF, liver cirrhosis secondary to hepatitis B/hepatitis C/EtOH, COPD, paroxysmal atrial fibrillation, CAD status post PCI, GERD, hypertension presenting to the hospital for evaluation of shortness of breath and abdominal distention.  Placed on 2 L supplemental oxygen by EMS for comfort.  Not on home oxygen.  Patient states he has chronic shortness of breath but it became worse last night.  States he goes for dialysis Monday Wednesday Friday but his last session yesterday was only 1 hour.  States previously some other sessions were also cut short because he left early.  Denies any fevers, chills, cough, lower extremity edema, or chest pain.  Reports having abdominal distention and diffuse abdominal pain.  States he was seen by his gastroenterologist and paracentesis is scheduled for today at 2 PM.  Reports noncompliance with lactulose as it causes him to have diarrhea.  States he has taken 2 doses of Bactrim given to him by his gastroenterologist but continues to have abdominal pain.  ED Course: Afebrile.  Blood pressure elevated.  No leukocytosis.  Hemoglobin 11.3, at baseline.  Platelet count 114,000, has chronic thrombocytopenia.  Potassium 6.0.  EKG with peaked T waves in leads V2-V5, appears worse compared to prior EKG from June 3. Troponin 0.07.  COVID-19 rapid test pending.  Chest x-ray showing cardiomegaly with vascular congestion.  Nephrology has been consulted and is planning on taking the patient for dialysis early in the morning.  Patient received Lokelma 10 g in the ED.  Review of Systems:  All systems reviewed and apart from history of presenting illness, are negative.  Past Medical History:   Diagnosis Date  . Anemia   . Anxiety   . Arthritis    left shoulder  . Atherosclerosis of aorta ([place])   . Cardiomegaly   . Chest pain    DATE UNKNOWN, C/O PERIODICALLY  . Cocaine abuse ([place])   . COPD exacerbation ([place]) 08/17/2016  . Coronary artery disease    stent 02/22/17  . ESRD (end stage renal disease) on dialysis ([place])    "[name]; MWF" (07/04/2017)  . GERD (gastroesophageal reflux disease)    DATE UNKNOWN  . Hemorrhoids   . Hepatitis B, chronic ([place])   . Hepatitis C   . History of kidney stones   . Hyperkalemia   . Hypertension   . Kidney failure   . Metabolic bone disease    Patient denies  . Mitral stenosis   . Myocardial infarction ([place])   . Pneumonia   . Pulmonary edema   . Solitary rectal ulcer syndrome 07/2017   at flex sig for rectal bleeding  . Tubular adenoma of colon     Past Surgical History:  Procedure Laterality Date  . A/V FISTULAGRAM Left 05/26/2017   Procedure: A/V FISTULAGRAM;  Surgeon: Conrad Whiting, MD;  Location: [place] [facility];  Service: Cardiovascular;  Laterality: Left;  . A/V FISTULAGRAM Right 11/18/2017   Procedure: A/V FISTULAGRAM - Right Arm;  Surgeon: Elam Dutch, MD;  Location: [place] [facility];  Service: Cardiovascular;  Laterality: Right;  . APPLICATION OF WOUND VAC Left 06/14/2017   Procedure: APPLICATION OF WOUND VAC;  Surgeon: Katha Cabal, MD;  Location: [facility];  Service:  Vascular;  Laterality: Left;  . AV FISTULA PLACEMENT  2012   BELIEVED WAS PLACED IN JUNE  . AV FISTULA PLACEMENT Right 08/09/2017   Procedure: Creation Right arm ARTERIOVENOUS BRACHIOCEPOHALIC FISTULA;  Surgeon: Elam Dutch, MD;  Location: [facility] OR;  Service: Vascular;  Laterality: Right;  . AV FISTULA PLACEMENT Right 11/22/2017   Procedure: INSERTION OF ARTERIOVENOUS (AV) GORE-TEX GRAFT RIGHT UPPER ARM;  Surgeon: Elam Dutch, MD;  Location: [place];  Service: Vascular;  Laterality: Right;  . BIOPSY  01/25/2018   Procedure: BIOPSY;   Surgeon: Jerene Bears, MD;  Location: [facility];  Service: Gastroenterology;;  . COLONOSCOPY    . COLONOSCOPY WITH PROPOFOL N/A 01/25/2018   Procedure: COLONOSCOPY WITH PROPOFOL;  Surgeon: Jerene Bears, MD;  Location: [place];  Service: Gastroenterology;  Laterality: N/A;  . CORONARY STENT INTERVENTION N/A 02/22/2017   Procedure: CORONARY STENT INTERVENTION;  Surgeon: Nigel Mormon, MD;  Location: [place] [facility];  Service: Cardiovascular;  Laterality: N/A;  . ESOPHAGOGASTRODUODENOSCOPY (EGD) WITH PROPOFOL N/A 01/25/2018   Procedure: ESOPHAGOGASTRODUODENOSCOPY (EGD) WITH PROPOFOL;  Surgeon: Jerene Bears, MD;  Location: [place];  Service: Gastroenterology;  Laterality: N/A;  . FLEXIBLE SIGMOIDOSCOPY N/A 07/15/2017   Procedure: FLEXIBLE SIGMOIDOSCOPY;  Surgeon: Carol Ada, MD;  Location: [place];  Service: Endoscopy;  Laterality: N/A;  . HEMORRHOID BANDING    . I&D EXTREMITY Left 06/01/2017   Procedure: IRRIGATION AND DEBRIDEMENT LEFT ARM HEMATOMA WITH LIGATION OF LEFT ARM AV FISTULA;  Surgeon: Elam Dutch, MD;  Location: [place];  Service: Vascular;  Laterality: Left;  . I&D EXTREMITY Left 06/14/2017   Procedure: IRRIGATION AND DEBRIDEMENT EXTREMITY;  Surgeon: Katha Cabal, MD;  Location: [facility];  Service: Vascular;  Laterality: Left;  . INSERTION OF DIALYSIS CATHETER  05/30/2017  . INSERTION OF DIALYSIS CATHETER N/A 05/30/2017   Procedure: INSERTION OF DIALYSIS CATHETER;  Surgeon: Elam Dutch, MD;  Location: [place];  Service: Vascular;  Laterality: N/A;  . IR PARACENTESIS  08/30/2017  . IR PARACENTESIS  09/29/2017  . IR PARACENTESIS  10/28/2017  . IR PARACENTESIS  11/09/2017  . IR PARACENTESIS  11/16/2017  . IR PARACENTESIS  11/28/2017  . IR PARACENTESIS  12/01/2017  . IR PARACENTESIS  12/06/2017  . IR PARACENTESIS  01/03/2018  . IR PARACENTESIS  01/23/2018  . IR PARACENTESIS  02/07/2018  . IR PARACENTESIS  02/21/2018  . IR PARACENTESIS  03/06/2018  . IR  PARACENTESIS  03/17/2018  . IR PARACENTESIS  04/04/2018  . IR RADIOLOGIST EVAL & MGMT  02/14/2018  . LEFT HEART CATH AND CORONARY ANGIOGRAPHY N/A 02/22/2017   Procedure: LEFT HEART CATH AND CORONARY ANGIOGRAPHY;  Surgeon: Nigel Mormon, MD;  Location: [place] [facility];  Service: Cardiovascular;  Laterality: N/A;  . LIGATION OF ARTERIOVENOUS  FISTULA Left 10/10/6604   Procedure: Plication of Left Arm Arteriovenous Fistula;  Surgeon: Elam Dutch, MD;  Location: [place];  Service: Vascular;  Laterality: Left;  . POLYPECTOMY    . POLYPECTOMY  01/25/2018   Procedure: POLYPECTOMY;  Surgeon: Jerene Bears, MD;  Location: [place];  Service: Gastroenterology;;  . REVISON OF ARTERIOVENOUS FISTULA Left 09/09/6008   Procedure: PLICATION OF DISTAL ANEURYSMAL SEGEMENT OF LEFT UPPER ARM ARTERIOVENOUS FISTULA;  Surgeon: Elam Dutch, MD;  Location: [place];  Service: Vascular;  Laterality: Left;  . REVISON OF ARTERIOVENOUS FISTULA Left 9/32/3557   Procedure: Plication of Left Upper Arm Fistula ;  Surgeon: Georgia Dom  Donzetta Matters, MD;  Location: [place];  Service: Vascular;  Laterality: Left;  . SKIN GRAFT SPLIT THICKNESS LEG / FOOT Left    SKIN GRAFT SPLIT THICKNESS LEFT ARM DONOR SITE: LEFT ANTERIOR THIGH  . SKIN SPLIT GRAFT Left 07/04/2017   Procedure: SKIN GRAFT SPLIT THICKNESS LEFT ARM DONOR SITE: LEFT ANTERIOR THIGH;  Surgeon: Elam Dutch, MD;  Location: [place];  Service: Vascular;  Laterality: Left;  . THROMBECTOMY W/ EMBOLECTOMY Left 06/05/2017   Procedure: EXPLORATION OF LEFT ARM FOR BLEEDING; OVERSEWED PROXIMAL FISTULA;  Surgeon: Angelia Mould, MD;  Location: [place];  Service: Vascular;  Laterality: Left;  . WOUND EXPLORATION Left 06/03/2017   Procedure: WOUND EXPLORATION WITH WOUND VAC APPLICATION TO LEFT ARM;  Surgeon: Angelia Mould, MD;  Location: [place];  Service: Vascular;  Laterality: Left;     reports that he has been smoking cigarettes. He started smoking about  45 years ago. He has a 21.50 pack-year smoking history. He has never used smokeless tobacco. He reports previous alcohol use. He reports current drug use. Drug: Marijuana.  Allergies  Allergen Reactions  . Morphine And Related Other (See Comments)    Stomach pain  . Aspirin Other (See Comments)    STOMACH PAIN  . Clonidine Derivatives Itching  . Tramadol Itching  . Tylenol [Acetaminophen] Nausea Only    Stomach ache    Family History  Problem Relation Age of Onset  . Heart disease Mother   . Lung cancer Mother   . Heart disease Father   . Malignant hyperthermia Father   . COPD Father   . Throat cancer Sister   . Esophageal cancer Sister   . Hypertension Other   . COPD Other   . Colon cancer Neg Hx   . Colon polyps Neg Hx   . Rectal cancer Neg Hx   . Stomach cancer Neg Hx     Prior to Admission medications   Medication Sig Start Date End Date Taking? Authorizing Provider  albuterol (VENTOLIN HFA) 108 (90 Base) MCG/ACT inhaler Inhale 2 puffs into the lungs every 6 (six) hours as needed for wheezing or shortness of breath.    [provider]  aspirin 81 MG EC tablet Take 1 tablet (81 mg total) by mouth daily. 03/12/18   Geradine Girt, DO  bisacodyl (DULCOLAX) 5 MG EC tablet Take 5 mg by mouth daily as needed for moderate constipation.    [provider]  hydrALAZINE (APRESOLINE) 100 MG tablet Take 1 tablet (100 mg total) by mouth 2 (two) times daily as needed. Taking after HD on Dialysis days Patient taking differently: Take 100 mg by mouth 2 (two) times daily as needed (high blood pressure). If needed on Monday, Wednesday, Friday take after dialysis 03/11/18   Geradine Girt, DO  lactulose (CHRONULAC) 10 GM/15ML solution Take 45 mLs (30 g total) by mouth 2 (two) times daily. 12/26/18   [place]sterwood, Amy S, PA-C  metoprolol tartrate (LOPRESSOR) 25 MG tablet Take 1 tablet (25 mg total) by mouth 2 (two) times daily. 03/11/18   Geradine Girt, DO  pantoprazole  (PROTONIX) 40 MG tablet Take 1 tablet (40 mg total) by mouth See admin instructions. Take one tablet (40 mg) by mouth in the morning on Sunday, Tuesday, Thursday, Saturday, take one tablet (40 mg) after dialysis on Monday, Wednesday, Friday. May also take one tablet (40 mg) in the evening as needed for acid reflux. 12/26/18   Esterwood, Amy S, PA-C  sevelamer  carbonate (RENVELA) 800 MG tablet Take 4 tablets with meals  And 2 tablets twice daily with snacks Patient taking differently: Take 2,400-3,200 mg by mouth See admin instructions. Take 3 - 4 tablets (2400 - 3200 mg) up to five times daily with meals or snacks 01/22/18   Patrecia Pour, MD  sulfamethoxazole-trimethoprim (BACTRIM DS) 800-160 MG tablet Take 1 tablet by mouth 2 (two) times daily. 12/26/18   Esterwood, Amy S, PA-C  SYMBICORT 160-4.5 MCG/ACT inhaler Inhale 2 puffs into the lungs 2 (two) times a day. 11/30/18   [provider]  zolpidem (AMBIEN) 5 MG tablet Take 5 mg by mouth at bedtime. 12/08/18   [provider]    Physical Exam: Vitals:   12/27/18 2331 12/28/18 0045 12/28/18 0100 12/28/18 0200  BP: 134/67 (!) 157/75 (!) 170/95 (!) 169/91  Pulse: 83 84 91   Resp: 18 15 14 16   Temp: 97.6 F (36.4 C)     TempSrc: Oral     SpO2: 99% 100% 100%   Weight:      Height:        Physical Exam  Constitutional: He is oriented to person, place, and time. He appears well-developed and well-nourished. No distress.  Resting comfortably  HENT:  Head: Normocephalic.  Mouth/Throat: Oropharynx is clear and moist.  Eyes: Right eye exhibits no discharge. Left eye exhibits no discharge.  Neck: Neck supple. JVD present.  Cardiovascular: Normal rate, regular rhythm and intact distal pulses.  Pulmonary/Chest: Effort normal and breath sounds normal. No respiratory distress. He has no wheezes. He has no rales.  Abdominal: Bowel sounds are normal. He exhibits distension. There is abdominal tenderness. There is no rebound and no  guarding.  Generalized tenderness to palpation  Musculoskeletal:        General: No edema.  Neurological: He is alert and oriented to person, place, and time.  Skin: Skin is warm and dry. He is not diaphoretic.  Psychiatric:  Appears agitated     Labs on Admission: I have personally reviewed following labs and imaging studies  CBC: Recent Labs  Lab 12/27/18 2134 12/28/18 0321  WBC 7.6 6.9  HGB 11.3* 11.0*  HCT 32.4* 31.5*  MCV 80.4 79.7*  PLT 114* 275*   Basic Metabolic Panel: Recent Labs  Lab 12/27/18 2134  NA 136  K 6.0*  CL 97*  CO2 22  GLUCOSE 83  BUN 35*  CREATININE 8.18*  CALCIUM 9.8   GFR: Estimated Creatinine Clearance: 10.1 mL/min (A) (by C-G formula based on SCr of 8.18 mg/dL (H)). Liver Function Tests: No results for input(s): AST, ALT, ALKPHOS, BILITOT, PROT, ALBUMIN in the last 168 hours. No results for input(s): LIPASE, AMYLASE in the last 168 hours. No results for input(s): AMMONIA in the last 168 hours. Coagulation Profile: Recent Labs  Lab 12/26/18 1110  INR 1.2*   Cardiac Enzymes: Recent Labs  Lab 12/27/18 2134  TROPONINI 0.07*   BNP (last 3 results) No results for input(s): PROBNP in the last 8760 hours. HbA1C: No results for input(s): HGBA1C in the last 72 hours. CBG: No results for input(s): GLUCAP in the last 168 hours. Lipid Profile: No results for input(s): CHOL, HDL, LDLCALC, TRIG, CHOLHDL, LDLDIRECT in the last 72 hours. Thyroid Function Tests: No results for input(s): TSH, T4TOTAL, FREET4, T3FREE, THYROIDAB in the last 72 hours. Anemia Panel: No results for input(s): VITAMINB12, FOLATE, FERRITIN, TIBC, IRON, RETICCTPCT in the last 72 hours. Urine analysis:    Component Value Date/Time  COLORURINE YELLOW 07/16/2011 [place] 07/16/2011 1619   LABSPEC 1.018 07/16/2011 1619   PHURINE 7.5 07/16/2011 1619   GLUCOSEU 250 (A) 07/16/2011 1619   HGBUR MODERATE (A) 07/16/2011 1619   BILIRUBINUR SMALL (A)  07/16/2011 1619   KETONESUR NEGATIVE 07/16/2011 1619   PROTEINUR >300 (A) 07/16/2011 1619   UROBILINOGEN 1.0 07/16/2011 1619   NITRITE NEGATIVE 07/16/2011 1619   LEUKOCYTESUR SMALL (A) 07/16/2011 1619    Radiological Exams on Admission: Dg Chest 2 View  Result Date: 12/27/2018 CLINICAL DATA:  Shortness of breath EXAM: CHEST - 2 VIEW COMPARISON:  12/10/2018, 09/21/2018, 03/10/2018 FINDINGS: Mild elevation of left diaphragm. No pleural effusion. Cardiomegaly with vascular congestion. Aortic atherosclerosis. No pneumothorax. IMPRESSION: Cardiomegaly with vascular congestion. Electronically Signed   By: Donavan Foil M.D.   On: 12/27/2018 21:50    EKG: Independently reviewed.  Difficult to read due to artifact.  Atrial fibrillation (heart rate 103), LBBB, QTC 508.  LBBB and QTC prolongation seen on prior EKG as well.  Peaked T waves in leads V2-V5, appear worse compared to prior EKG from June 3.  Assessment/Plan Principal Problem:   Volume overload Active Problems:   Ascites   COPD (chronic obstructive pulmonary disease) (HCC)   Hyperkalemia   Liver cirrhosis ([facility])  Volume overload and hyperkalemia secondary to hemodialysis noncompliance Chest x-ray showing cardiomegaly with vascular congestion.  Placed on 2L oxygen for comfort. Potassium 6.0.  EKG with peaked T waves in leads V2-V5, appears worse compared to prior EKG from June 3. -Nephrology has been consulted and is planning on taking the patient for dialysis early in the morning -Cardiac monitoring -Received Lokelma in the ED.  Give insulin, D50, and bicarb. -Repeat potassium level -Continuous pulse ox -COVID-19 rapid test pending  ESRD on HD MWF Presenting with signs of volume overload and hyperkalemia. -Nephrology has been consulted and is planning on taking the patient for dialysis early in the morning   Ascites, decompensated liver cirrhosis secondary to hep B/hep C/EtOH Afebrile and no leukocytosis.  No encephalopathy.   Patient was seen by GI 2 days ago and  was scheduled for outpatient paracentesis today. -Ceftriaxone -Continue lactulose -Consult IR in a.m. for paracentesis  Chronic thrombocytopenia in the setting of liver cirrhosis and end-stage renal disease Platelet count 114,000.  No signs of active bleeding. -Continue to monitor platelet count  Hypertension Blood pressure elevated.  -Continue home metoprolol -Hydralazine PRN -HD early morning  Anemia of chronic disease -Stable. Hemoglobin 11.3, at baseline.  Elevated troponin, history of CAD status post PCI Patient denies having any chest pain and appears comfortable on exam.  Troponin 0.07.  EKG with peaked T waves in the setting of hyperkalemia.  Mild troponin elevation likely secondary to ESRD and demand ischemia from volume overload. -Cardiac monitoring -Trend troponin  COPD -Stable.  No wheezing or cough.  Duonebs as needed.  Paroxysmal atrial fibrillation -Currently rate controlled.  Per review of cardiology note from October 24, 2018, not on anticoagulation due to history of noncompliance.  Continue metoprolol for rate control.  QTC prolongation on EKG -Cardiac monitoring -Keep K >4 and Mag >2 -Repeat EKG in a.m. -Avoid QT prolonging drugs if possible  DVT prophylaxis: Subcutaneous heparin Code Status: Patient wishes to be DNR. Family Communication: No family available. Disposition Plan: Anticipate discharge after clinical improvement. Consults called: Nephrology (Dr. Moshe Cipro) Admission status: It is my clinical opinion that referral for OBSERVATION is reasonable and necessary in this patient based on the above information  provided. The aforementioned taken together are felt to place the patient at high risk for further clinical deterioration. However it is anticipated that the patient may be medically stable for discharge from the hospital within 24 to 48 hours.  The medical decision making on this patient was of high  complexity and the patient is at high risk for clinical deterioration, therefore this is a level 3 visit.  Shela Leff MD [facility] Pager [phone]  If 7PM-7AM, please contact night-coverage [email or web address] Password [email or web address]  12/28/2018, 4:00 AM

## 2018-12-29 ENCOUNTER — Other Ambulatory Visit: Payer: Self-pay

## 2018-12-29 DIAGNOSIS — Z7951 Long term (current) use of inhaled steroids: Secondary | ICD-10-CM | POA: Diagnosis not present

## 2018-12-29 DIAGNOSIS — E875 Hyperkalemia: Secondary | ICD-10-CM | POA: Diagnosis present

## 2018-12-29 DIAGNOSIS — N2581 Secondary hyperparathyroidism of renal origin: Secondary | ICD-10-CM | POA: Diagnosis not present

## 2018-12-29 DIAGNOSIS — Z79899 Other long term (current) drug therapy: Secondary | ICD-10-CM | POA: Diagnosis not present

## 2018-12-29 DIAGNOSIS — R0602 Shortness of breath: Secondary | ICD-10-CM | POA: Diagnosis not present

## 2018-12-29 DIAGNOSIS — Z955 Presence of coronary angioplasty implant and graft: Secondary | ICD-10-CM | POA: Diagnosis not present

## 2018-12-29 DIAGNOSIS — I480 Paroxysmal atrial fibrillation: Secondary | ICD-10-CM | POA: Diagnosis present

## 2018-12-29 DIAGNOSIS — D631 Anemia in chronic kidney disease: Secondary | ICD-10-CM | POA: Diagnosis not present

## 2018-12-29 DIAGNOSIS — Z9115 Patient's noncompliance with renal dialysis: Secondary | ICD-10-CM | POA: Diagnosis not present

## 2018-12-29 DIAGNOSIS — B181 Chronic viral hepatitis B without delta-agent: Secondary | ICD-10-CM | POA: Diagnosis present

## 2018-12-29 DIAGNOSIS — J449 Chronic obstructive pulmonary disease, unspecified: Secondary | ICD-10-CM | POA: Diagnosis present

## 2018-12-29 DIAGNOSIS — I252 Old myocardial infarction: Secondary | ICD-10-CM | POA: Diagnosis not present

## 2018-12-29 DIAGNOSIS — Z992 Dependence on renal dialysis: Secondary | ICD-10-CM | POA: Diagnosis not present

## 2018-12-29 DIAGNOSIS — D6959 Other secondary thrombocytopenia: Secondary | ICD-10-CM | POA: Diagnosis present

## 2018-12-29 DIAGNOSIS — K7031 Alcoholic cirrhosis of liver with ascites: Secondary | ICD-10-CM | POA: Diagnosis present

## 2018-12-29 DIAGNOSIS — E8770 Fluid overload, unspecified: Secondary | ICD-10-CM | POA: Diagnosis present

## 2018-12-29 DIAGNOSIS — I248 Other forms of acute ischemic heart disease: Secondary | ICD-10-CM | POA: Diagnosis present

## 2018-12-29 DIAGNOSIS — Z9119 Patient's noncompliance with other medical treatment and regimen: Secondary | ICD-10-CM | POA: Diagnosis not present

## 2018-12-29 DIAGNOSIS — N186 End stage renal disease: Secondary | ICD-10-CM | POA: Diagnosis present

## 2018-12-29 DIAGNOSIS — Z7982 Long term (current) use of aspirin: Secondary | ICD-10-CM | POA: Diagnosis not present

## 2018-12-29 DIAGNOSIS — R188 Other ascites: Secondary | ICD-10-CM | POA: Diagnosis not present

## 2018-12-29 DIAGNOSIS — Z66 Do not resuscitate: Secondary | ICD-10-CM | POA: Diagnosis present

## 2018-12-29 DIAGNOSIS — K7460 Unspecified cirrhosis of liver: Secondary | ICD-10-CM | POA: Diagnosis not present

## 2018-12-29 DIAGNOSIS — Z1159 Encounter for screening for other viral diseases: Secondary | ICD-10-CM | POA: Diagnosis not present

## 2018-12-29 DIAGNOSIS — E8889 Other specified metabolic disorders: Secondary | ICD-10-CM | POA: Diagnosis present

## 2018-12-29 DIAGNOSIS — I2510 Atherosclerotic heart disease of native coronary artery without angina pectoris: Secondary | ICD-10-CM | POA: Diagnosis present

## 2018-12-29 DIAGNOSIS — I120 Hypertensive chronic kidney disease with stage 5 chronic kidney disease or end stage renal disease: Secondary | ICD-10-CM | POA: Diagnosis present

## 2018-12-29 DIAGNOSIS — B1920 Unspecified viral hepatitis C without hepatic coma: Secondary | ICD-10-CM | POA: Diagnosis present

## 2018-12-29 DIAGNOSIS — F17210 Nicotine dependence, cigarettes, uncomplicated: Secondary | ICD-10-CM | POA: Diagnosis present

## 2018-12-29 LAB — PATHOLOGIST SMEAR REVIEW: Path Review: 6192020

## 2018-12-29 MED ORDER — TRAZODONE HCL 50 MG PO TABS
50.0000 mg | ORAL_TABLET | Freq: Once | ORAL | Status: AC
  Administered 2018-12-29: 50 mg via ORAL
  Filled 2018-12-29: qty 1

## 2018-12-29 NOTE — Plan of Care (Signed)

## 2018-12-29 NOTE — Progress Notes (Signed)
Progress Note    [name]  PQZ:[id number] DOB: February 21, 1961  DOA: 12/27/2018 PCP: [name], FNP    Brief Narrative:     Medical records reviewed and are as summarized below:  [name] is an 58 y.o. male with medical history significant of ESRD on HD MWF, liver cirrhosis secondary to hepatitis B/hepatitis C/EtOH, COPD, paroxysmal atrial fibrillation, CAD status post PCI, GERD, hypertension presenting to the hospital for evaluation of shortness of breath and abdominal distention.  Assessment/Plan:   Principal Problem:   Volume overload Active Problems:   Ascites   COPD (chronic obstructive pulmonary disease) (HCC)   Hyperkalemia   Liver cirrhosis (HCC)  Volume overload and hyperkalemia secondary to hemodialysis noncompliance -Initially chest x-ray showing cardiomegaly with vascular congestion.  Placed on 2L oxygen for comfort. Potassium 6.0.  EKG with peaked T waves in leads V2-V5, appears worse compared to prior EKG from June 3. -s/p HD on 6/18-- refused HD on 6/19 and plan is for HD on 6/20 -will hope for d/c after  ESRD on HD MWF Presenting with signs of volume overload and hyperkalemia. -improved s/p HD   Ascites, decompensated liver cirrhosis secondary to hep B/hep C/EtOH Afebrile and no leukocytosis.  No encephalopathy.  Patient was seen by GI 2 days ago and  was scheduled for outpatient paracentesis 6/18 -s/p paracentesis with 3.4L removed  Chronic thrombocytopenia in the setting of liver cirrhosis and end-stage renal disease Platelet count 114,000.  No signs of active bleeding. -Continue to monitor platelet count  Hypertension Blood pressure elevated.  -Continue home metoprolol -Hydralazine PRN   Anemia of chronic disease -Stable. Hemoglobin 11.3, at baseline.  COPD -Stable.  No wheezing or cough.  Duonebs as needed.  Paroxysmal atrial fibrillation -Currently rate controlled.  Per review of cardiology note from  October 24, 2018, not on anticoagulation due to history of noncompliance.  Continue metoprolol for rate control.  QTC prolongation on EKG -Cardiac monitoring -Keep K >4 and Mag >2 -Repeat EKG in a.m. -Avoid QT prolonging drugs if possible   Family Communication/Anticipated D/C date and plan/Code Status   DVT prophylaxis: refusing heparin Code Status: dnr Family Communication:  Disposition Plan: home after HD in AM   Medical Consultants:    renal     Subjective:   Less itching today  Objective:    Vitals:   12/29/18 0528 12/29/18 0743 12/29/18 0846 12/29/18 1213  BP:  137/62  139/84  Pulse:  (!) 53  (!) 54  Resp:  18  18  Temp:  98.2 F (36.8 C)  98 F (36.7 C)  TempSrc:  Oral  Oral  SpO2:  96% 96% 96%  Weight: 66 kg     Height:        Intake/Output Summary (Last 24 hours) at 12/29/2018 1253 Last data filed at 12/29/2018 1214 Gross per 24 hour  Intake 960 ml  Output 3400 ml  Net -2440 ml   Filed Weights   12/28/18 0731 12/28/18 1143 12/29/18 0528  Weight: 74.7 kg 70.9 kg 66 kg    Exam: Sitting on side of bed NAD Diminished at bases No LE edema Abdomen not distended  Data Reviewed:   I have personally reviewed following labs and imaging studies:  Labs: Labs show the following:   Basic Metabolic Panel: Recent Labs  Lab 12/27/18 2134 12/28/18 0321 12/28/18 1235  NA 136 135  --   K 6.0* 6.0* 4.2  CL 97* 95*  --  CO2 22 21*  --   GLUCOSE 83 74  --   BUN 35* 36*  --   CREATININE 8.18* 8.49*  --   CALCIUM 9.8 9.6  --   MG  --  2.1  --    GFR Estimated Creatinine Clearance: 9.1 mL/min (A) (by C-G formula based on SCr of 8.49 mg/dL (H)). Liver Function Tests: No results for input(s): AST, ALT, ALKPHOS, BILITOT, PROT, ALBUMIN in the last 168 hours. No results for input(s): LIPASE, AMYLASE in the last 168 hours. No results for input(s): AMMONIA in the last 168 hours. Coagulation profile Recent Labs  Lab 12/26/18 1110  INR 1.2*     CBC: Recent Labs  Lab 12/27/18 2134 12/28/18 0321  WBC 7.6 6.9  HGB 11.3* 11.0*  HCT 32.4* 31.5*  MCV 80.4 79.7*  PLT 114* 111*   Cardiac Enzymes: Recent Labs  Lab 12/27/18 2134 12/28/18 0304 12/28/18 0321 12/28/18 1235 12/28/18 1838  TROPONINI 0.07* 0.07* 0.07* 0.07* 0.07*   BNP (last 3 results) No results for input(s): PROBNP in the last 8760 hours. CBG: Recent Labs  Lab 12/28/18 0413  GLUCAP 75   D-Dimer: No results for input(s): DDIMER in the last 72 hours. Hgb A1c: No results for input(s): HGBA1C in the last 72 hours. Lipid Profile: No results for input(s): CHOL, HDL, LDLCALC, TRIG, CHOLHDL, LDLDIRECT in the last 72 hours. Thyroid function studies: No results for input(s): TSH, T4TOTAL, T3FREE, THYROIDAB in the last 72 hours.  Invalid input(s): FREET3 Anemia work up: No results for input(s): VITAMINB12, FOLATE, FERRITIN, TIBC, IRON, RETICCTPCT in the last 72 hours. Sepsis Labs: Recent Labs  Lab 12/27/18 2134 12/28/18 0321  WBC 7.6 6.9    Microbiology Recent Results (from the past 240 hour(s))  SARS Coronavirus 2     Status: None   Collection Time: 12/28/18  2:50 AM  Result Value Ref Range Status   SARS Coronavirus 2 NOT DETECTED NOT DETECTED Final    Comment: (NOTE) SARS-CoV-2 target nucleic acids are NOT DETECTED. The SARS-CoV-2 RNA is generally detectable in upper and lower respiratory specimens during the acute phase of infection.  Negative  results do not preclude SARS-CoV-2 infection, do not rule out co-infections with other pathogens, and should not be used as the sole basis for treatment or other patient management decisions.  Negative results must be combined with clinical observations, patient history, and epidemiological information. The expected result is Not Detected. Fact Sheet for Patients: http://www.biofiredefense.com/wp-content/uploads/2020/03/BIOFIRE-COVID -19-patients.pdf Fact Sheet for Healthcare Providers:  http://www.biofiredefense.com/wp-content/uploads/2020/03/BIOFIRE-COVID -19-hcp.pdf This test is not yet approved or cleared by the [place] and  has been authorized for detection and/or diagnosis of SARS-CoV-2 by FDA under an Emergency Use Authorization (EUA).  This EUA will remain in effec t (meaning this test can be used) for the duration of  the COVID-19 declaration under Section 564(b)(1) of the Act, 21 U.S.C. section 360bbb-3(b)(1), unless the authorization is terminated or revoked sooner. Performed at [facility], [place] [address] [place], [place] [address]   Gram stain     Status: None   Collection Time: 12/28/18  1:57 PM   Specimen: Peritoneal Cavity; Peritoneal Fluid  Result Value Ref Range Status   Specimen Description PERITONEAL  Final   Special Requests NONE  Final   Gram Stain   Final    RARE WBC PRESENT, PREDOMINANTLY MONONUCLEAR NO ORGANISMS SEEN Performed at [facility], [place] [address] [place], [place] [address]    Report Status 12/28/2018 FINAL  Final  Culture, body fluid-bottle     Status: None (Preliminary result)   Collection Time: 12/28/18  1:57 PM   Specimen: Peritoneal Washings  Result Value Ref Range Status   Specimen Description PERITONEAL  Final   Special Requests NONE  Final   Culture   Final    NO GROWTH < 24 HOURS Performed at [facility], [place] [address], [place], [place] [address]    Report Status PENDING  Incomplete    Procedures and diagnostic studies:  Dg Chest 2 View  Result Date: 12/27/2018 CLINICAL DATA:  Shortness of breath EXAM: CHEST - 2 VIEW COMPARISON:  12/10/2018, 09/21/2018, 03/10/2018 FINDINGS: Mild elevation of left diaphragm. No pleural effusion. Cardiomegaly with vascular congestion. Aortic atherosclerosis. No pneumothorax. IMPRESSION: Cardiomegaly with vascular congestion. Electronically Signed   By: Donavan Foil M.D.   On: 12/27/2018 21:50   Ir Paracentesis  Result Date: 12/28/2018  INDICATION: History of alcoholic cirrhosis with recurrent ascites. Request for diagnostic and therapeutic paracentesis. EXAM: ULTRASOUND GUIDED RIGHT LOWER QUADRANT PARACENTESIS MEDICATIONS: None. COMPLICATIONS: None immediate. PROCEDURE: Informed written consent was obtained from the patient after a discussion of the risks, benefits and alternatives to treatment. A timeout was performed prior to the initiation of the procedure. Initial ultrasound scanning demonstrates a large amount of ascites within the right lower abdominal quadrant. The right lower abdomen was prepped and draped in the usual sterile fashion. 1% lidocaine with epinephrine was used for local anesthesia. Following this, a 19 gauge, 7-cm, Yueh catheter was introduced. An ultrasound image was saved for documentation purposes. The paracentesis was performed. The catheter was removed and a dressing was applied. The patient tolerated the procedure well without immediate post procedural complication. FINDINGS: A total of approximately 3.4 L of clear, amber colored fluid was removed. Samples were sent to the laboratory as requested by the clinical team. IMPRESSION: Successful ultrasound-guided paracentesis yielding 3.4 liters of peritoneal fluid. Read by: Ascencion Dike PA-C Electronically Signed   By: Corrie Mckusick D.O.   On: 12/28/2018 14:44    Medications:   . calcitRIOL  3 mcg Oral Q T,Th,Sa-HD  . Chlorhexidine Gluconate Cloth  6 each Topical Q0600  . heparin  5,000 Units Subcutaneous Q8H  . lactulose  30 g Oral BID  . metoprolol tartrate  25 mg Oral BID  . mometasone-formoterol  2 puff Inhalation BID  . pantoprazole  40 mg Oral Daily  . sevelamer carbonate  1,600 mg Oral TID WC  . sulfamethoxazole-trimethoprim  1 tablet Oral q1800   Continuous Infusions: . methocarbamol (ROBAXIN) IV       LOS: 0 days   Geradine Girt  [facility]   How to contact the [facility] Attending or Consulting provider [place] or covering provider  during after hours [place], for this patient?  1. Check the care team in [facility] and look for a) attending/consulting [facility] provider listed and b) the [facility] team listed 2. Log into [email or web address] and use [place]'s universal password to access. If you do not have the password, please contact the hospital operator. 3. Locate the [facility] provider you are looking for under [facility] and page to a number that you can be directly reached. 4. If you still have difficulty reaching the provider, please page the [facility] (Director on Call) for the Hospitalists listed on amion for assistance.  12/29/2018, 12:53 PM

## 2018-12-29 NOTE — Progress Notes (Signed)
Pt refusing bed alarm.

## 2018-12-29 NOTE — Progress Notes (Signed)
Renal Navigator was asked to see if patient's OP HD clinic/[facility] can accommodate him in the clinic tomorrow. Renal Navigator spoke with clinic staff who stated that unfortunately they cannot since they already have patients treating in the isolation bay tomorrow. Nephrologist/Dr. Hollie Salk notified.  Alphonzo Cruise, [place] Renal Navigator [phone]

## 2018-12-29 NOTE — Progress Notes (Signed)
Patient called nurse and requested to have another tray ordered.  Patient was given education on current diet and its importance, pt verbalized understanding, however is requesting another tray.   Patient refused to have his pt bracelet and restricted extremely bracelet, per pt it irritates his skin.  Diet, safety (bed alarm refusal), and medication education given through out  the day. Pt verbalizes understanding , but requires continuous teaching.

## 2018-12-29 NOTE — Progress Notes (Addendum)
  [facility] Progress Note   Assessment/ Plan:    Dialyzes at [facility]- MWF- 4 hours  EDW 70.5. HD Bath 2/2, Dialyzer 180, Heparin NO. Access AVG.  Calcitriol 3 mcg per tx and parsabiv 7.5.  Last labs hgb 11.3, phos 3.6, calc 9.5, pth 535 and alb 4.4  1 SOB/hyperkalemia-  Due to noncompliance with dialysis.  s/p HD Thursday AM, declined HD for today, next rx 1st shift Saturday 2 ESRD: normally MWF -  Often runs very short treatments.   3 Hypertension: is the norm for him- on med list is hydralazine and diltiazem.  Also UF with HD 4. Anemia of ESRD: not a major issue right now-  Normally not on ESA 5. Metabolic Bone Disease: last labs not bad.  Will continue home doses of calcitriol and renvela  Cannot give parsabiv here at the hospital 6.  Hep b/c /cirrhosis-  Has been getting paracentesis, s/p 3.4L paracentesis with 270 TNC in yesterday, 74% lymphs.  Antibiotics stopped.  Subjective:    S/p HD and paracentesis yesterday.  Reports he's sore and hurting.  Has declined HD rx today.     Objective:   BP 137/62 (BP Location: Left Arm)   Pulse (!) 53   Temp 98.2 F (36.8 C) (Oral)   Resp 18   Ht 5\' 9"  (1.753 m)   Wt 66 kg   SpO2 96%   BMI 21.49 kg/m   Physical Exam: JJK:KXFG, cachectic, NAD CVS: RRR loud S2 Resp: normal WOB, off O2, no crackles Abd: distention much improved Ext: minimal LE edema ACCESS: RUE AVG + T/B  Labs: BMET Recent Labs  Lab 12/27/18 2134 12/28/18 0321 12/28/18 1235  NA 136 135  --   K 6.0* 6.0* 4.2  CL 97* 95*  --   CO2 22 21*  --   GLUCOSE 83 74  --   BUN 35* 36*  --   CREATININE 8.18* 8.49*  --   CALCIUM 9.8 9.6  --    CBC Recent Labs  Lab 12/27/18 2134 12/28/18 0321  WBC 7.6 6.9  HGB 11.3* 11.0*  HCT 32.4* 31.5*  MCV 80.4 79.7*  PLT 114* 111*    @IMGRELPRIORS @ Medications:    . calcitRIOL  3 mcg Oral Q T,Th,Sa-HD  . Chlorhexidine Gluconate Cloth  6 each Topical Q0600  . heparin  5,000 Units Subcutaneous Q8H  .  lactulose  30 g Oral BID  . metoprolol tartrate  25 mg Oral BID  . mometasone-formoterol  2 puff Inhalation BID  . pantoprazole  40 mg Oral Daily  . sevelamer carbonate  1,600 mg Oral TID WC  . sulfamethoxazole-trimethoprim  1 tablet Oral q1800     Madelon Lips, MD [facility] pgr [phone] 12/29/2018, 9:47 AM

## 2018-12-30 LAB — BASIC METABOLIC PANEL
Anion gap: 13 (ref 5–15)
BUN: 38 mg/dL — ABNORMAL HIGH (ref 6–20)
CO2: 23 mmol/L (ref 22–32)
Calcium: 9.7 mg/dL (ref 8.9–10.3)
Chloride: 96 mmol/L — ABNORMAL LOW (ref 98–111)
Creatinine, Ser: 8.4 mg/dL — ABNORMAL HIGH (ref 0.61–1.24)
GFR calc Af Amer: 7 mL/min — ABNORMAL LOW (ref >60)
GFR calc non Af Amer: 6 mL/min — ABNORMAL LOW (ref >60)
Glucose, Bld: 99 mg/dL (ref 70–99)
Potassium: 5.5 mmol/L — ABNORMAL HIGH (ref 3.5–5.1)
Sodium: 132 mmol/L — ABNORMAL LOW (ref 135–145)

## 2018-12-30 MED ORDER — SULFAMETHOXAZOLE-TRIMETHOPRIM 800-160 MG PO TABS: 1.0000 | ORAL_TABLET | Freq: Every day | ORAL | 11 refills | Status: DC

## 2018-12-30 MED ORDER — SODIUM ZIRCONIUM CYCLOSILICATE 10 G PO PACK
10.0000 g | PACK | Freq: Once | ORAL | Status: AC
  Administered 2018-12-30: 10 g via ORAL
  Filled 2018-12-30: qty 1

## 2018-12-30 MED ORDER — OXYCODONE-ACETAMINOPHEN 5-325 MG PO TABS
1.0000 | ORAL_TABLET | Freq: Once | ORAL | Status: AC
  Administered 2018-12-30: 1 via ORAL
  Filled 2018-12-30: qty 1

## 2018-12-30 NOTE — Progress Notes (Signed)
  [facility] Progress Note   Assessment/ Plan:    Dialyzes at [facility]- MWF- 4 hours  EDW 70.5. HD Bath 2/2, Dialyzer 180, Heparin NO. Access AVG.  Calcitriol 3 mcg per tx and parsabiv 7.5.  Last labs hgb 11.3, phos 3.6, calc 9.5, pth 535 and alb 4.4  1 SOB/hyperkalemia-  Due to noncompliance with dialysis.  s/p HD Thursday AM, declined HD for 6/19, declined HD today 6/20 again. 2 ESRD: normally MWF -  Often runs very short treatments.   3 Hypertension: is the norm for him- on med list is hydralazine and diltiazem.  Also UF with HD--> overall improved 4. Anemia of ESRD: not a major issue right now-  Normally not on ESA 5. Metabolic Bone Disease: last labs not bad.  Will continue home doses of calcitriol and renvela  Cannot give parsabiv here at the hospital 6.  Hep b/c /cirrhosis-  Has been getting paracentesis, s/p 3.4L paracentesis with 270 TNC in yesterday, 74% lymphs.  Antibiotics stopped. 7. Dispo: has declined dialysis rx 6/19 and 6/20.  Will check BMP and give dose of lokelma if appropriate before d/c.  Subjective:    Declined to come for HD this AM.  Discussed with pt--> "wanted to eat breakfast before coming".   Reports RLQ paracentesis site is sore.  Requesting 2 apple juices and graham crackers.   Objective:   BP 139/69 (BP Location: Left Arm)   Pulse 73   Temp 99 F (37.2 C) (Oral)   Resp 18   Ht 5\' 9"  (1.753 m)   Wt 66 kg   SpO2 96%   BMI 21.49 kg/m   Physical Exam: GQB:VQXI, cachectic, NAD CVS: RRR loud S2 Resp: normal WOB, off O2, no crackles bilaterally Abd: distention much improved, RLQ paracentesis site without erythema, bruising Ext: minimal LE edema ACCESS: RUE AVG + T/B  Labs: BMET Recent Labs  Lab 12/27/18 2134 12/28/18 0321 12/28/18 1235  NA 136 135  --   K 6.0* 6.0* 4.2  CL 97* 95*  --   CO2 22 21*  --   GLUCOSE 83 74  --   BUN 35* 36*  --   CREATININE 8.18* 8.49*  --   CALCIUM 9.8 9.6  --    CBC Recent Labs  Lab  12/27/18 2134 12/28/18 0321  WBC 7.6 6.9  HGB 11.3* 11.0*  HCT 32.4* 31.5*  MCV 80.4 79.7*  PLT 114* 111*    @IMGRELPRIORS @ Medications:    . calcitRIOL  3 mcg Oral Q T,Th,Sa-HD  . Chlorhexidine Gluconate Cloth  6 each Topical Q0600  . heparin  5,000 Units Subcutaneous Q8H  . lactulose  30 g Oral BID  . metoprolol tartrate  25 mg Oral BID  . mometasone-formoterol  2 puff Inhalation BID  . oxyCODONE-acetaminophen  1 tablet Oral Once  . pantoprazole  40 mg Oral Daily  . sevelamer carbonate  1,600 mg Oral TID WC  . sulfamethoxazole-trimethoprim  1 tablet Oral q1800     Madelon Lips, MD [facility] pgr [phone] 12/30/2018, 11:09 AM

## 2018-12-30 NOTE — Plan of Care (Addendum)
  Problem: Education: Goal: Knowledge of General Education information will improve Description: Including pain rating scale, medication(s)/side effects and non-pharmacologic comfort measures Outcome: Completed/Met   Problem: Health Behavior/Discharge Planning: Goal: Ability to manage health-related needs will improve Outcome: Completed/Met   Problem: Clinical Measurements: Goal: Ability to maintain clinical measurements within normal limits will improve Outcome: Completed/Met Goal: Will remain free from infection Outcome: Completed/Met Goal: Diagnostic test results will improve Outcome: Completed/Met Goal: Respiratory complications will improve Outcome: Completed/Met Goal: Cardiovascular complication will be avoided Outcome: Completed/Met   Problem: Activity: Goal: Risk for activity intolerance will decrease Outcome: Completed/Met   Problem: Nutrition: Goal: Adequate nutrition will be maintained Outcome: Completed/Met   Problem: Coping: Goal: Level of anxiety will decrease Outcome: Completed/Met   Problem: Elimination: Goal: Will not experience complications related to bowel motility Outcome: Completed/Met Goal: Will not experience complications related to urinary retention Outcome: Completed/Met   Problem: Pain Managment: Goal: General experience of comfort will improve Outcome: Completed/Met   Problem: Safety: Goal: Ability to remain free from injury will improve Outcome: Completed/Met   Problem: Skin Integrity: Goal: Risk for impaired skin integrity will decrease Outcome: Completed/Met  Discharge instructions reviewed with patient.  These included the following:  When to call the MD, follow up appointments, medication changed, dietary and fluid restriction recommendations, etc.  Patient discharged to home via private vehicle.  Escorted to exit via wheelchair by nurse.

## 2018-12-30 NOTE — Discharge Summary (Signed)
Physician Discharge Summary  [name] FOY:[id number] DOB: 27-Jul-1960 DOA: 12/27/2018  PCP: [name], FNP  Admit date: 12/27/2018 Discharge date: 12/30/2018  Admitted From: home Discharge disposition: home   Recommendations for Outpatient Follow-Up:   1. Continue to encourage compliance with HD and diet   Discharge Diagnosis:   Principal Problem:   Volume overload Active Problems:   Ascites   COPD (chronic obstructive pulmonary disease) (HCC)   Hyperkalemia   Liver cirrhosis (HCC)    Discharge Condition: Improved.  Diet recommendation: renal.  Wound care: None.  Code status: Full.   History of Present Illness:   [name] is a 58 y.o. male with medical history significant of ESRD on HD MWF, liver cirrhosis secondary to hepatitis B/hepatitis C/EtOH, COPD, paroxysmal atrial fibrillation, CAD status post PCI, GERD, hypertension presenting to the hospital for evaluation of shortness of breath and abdominal distention.  Placed on 2 L supplemental oxygen by EMS for comfort.  Not on home oxygen.  Patient states he has chronic shortness of breath but it became worse last night.  States he goes for dialysis Monday Wednesday Friday but his last session yesterday was only 1 hour.  States previously some other sessions were also cut short because he left early.  Denies any fevers, chills, cough, lower extremity edema, or chest pain.  Reports having abdominal distention and diffuse abdominal pain.  States he was seen by his gastroenterologist and paracentesis is scheduled for today at 2 PM.  Reports noncompliance with lactulose as it causes him to have diarrhea.  States he has taken 2 doses of Bactrim given to him by his gastroenterologist but continues to have abdominal pain.   Hospital Course by Problem:   SOB/hyperkalemia- Due to noncompliance with dialysis. s/p HD Thursday AM, declined HD for 6/19, declined HD today 6/20 again.  Ascites,  decompensated liver cirrhosis secondary to hep B/hep C/EtOH Afebrileandno leukocytosis. No encephalopathy. Patient was seen by GI 2 days agoand was scheduled for outpatient paracentesis 6/18 -s/p paracentesis with 3.4L removed  ESRD: normally MWF - unfortunately signs off early often   Hypertension: -resume home meds  Anemia of ESRD -outpatient follow up  Chronic thrombocytopenia in the setting of liver cirrhosis and end-stage renal disease Platelet count 114,000. No signs of active bleeding. -Continue to monitor platelet count  Paroxysmal atrial fibrillation -Currently rate controlled. Per review of cardiology note from October 24, 2018, not on anticoagulation due to history of noncompliance. Continue metoprolol for rate control.  QTC prolongation on EKG -Cardiac monitoring -Keep K >4 and Mag >2 -Repeat EKG in a.m. -Avoid QT prolonging drugs if possible   He has declined dialysis rx 6/19 and 6/20.  discussed with nephro-- will give dose of lokelma and then d/c home   Medical Consultants:   renal   Discharge Exam:   Vitals:   12/30/18 0534 12/30/18 0738  BP: 139/69   Pulse:    Resp:    Temp:    SpO2:  96%   Vitals:   12/30/18 0501 12/30/18 0522 12/30/18 0534 12/30/18 0738  BP: (!) 178/88 (!) 141/81 139/69   Pulse: 73     Resp: 18     Temp: 99 F (37.2 C)     TempSrc: Oral     SpO2: 99%   96%  Weight:      Height:        General exam: Appears calm and comfortable.   The results of significant  diagnostics from this hospitalization (including imaging, microbiology, ancillary and laboratory) are listed below for reference.     Procedures and Diagnostic Studies:   Dg Chest 2 View  Result Date: 12/27/2018 CLINICAL DATA:  Shortness of breath EXAM: CHEST - 2 VIEW COMPARISON:  12/10/2018, 09/21/2018, 03/10/2018 FINDINGS: Mild elevation of left diaphragm. No pleural effusion. Cardiomegaly with vascular congestion. Aortic atherosclerosis. No  pneumothorax. IMPRESSION: Cardiomegaly with vascular congestion. Electronically Signed   By: Donavan Foil M.D.   On: 12/27/2018 21:50   Ir Paracentesis  Result Date: 12/28/2018 INDICATION: History of alcoholic cirrhosis with recurrent ascites. Request for diagnostic and therapeutic paracentesis. EXAM: ULTRASOUND GUIDED RIGHT LOWER QUADRANT PARACENTESIS MEDICATIONS: None. COMPLICATIONS: None immediate. PROCEDURE: Informed written consent was obtained from the patient after a discussion of the risks, benefits and alternatives to treatment. A timeout was performed prior to the initiation of the procedure. Initial ultrasound scanning demonstrates a large amount of ascites within the right lower abdominal quadrant. The right lower abdomen was prepped and draped in the usual sterile fashion. 1% lidocaine with epinephrine was used for local anesthesia. Following this, a 19 gauge, 7-cm, Yueh catheter was introduced. An ultrasound image was saved for documentation purposes. The paracentesis was performed. The catheter was removed and a dressing was applied. The patient tolerated the procedure well without immediate post procedural complication. FINDINGS: A total of approximately 3.4 L of clear, amber colored fluid was removed. Samples were sent to the laboratory as requested by the clinical team. IMPRESSION: Successful ultrasound-guided paracentesis yielding 3.4 liters of peritoneal fluid. Read by: Ascencion Dike PA-C Electronically Signed   By: Corrie Mckusick D.O.   On: 12/28/2018 14:44     Labs:   Basic Metabolic Panel: Recent Labs  Lab 12/27/18 2134 12/28/18 0321 12/28/18 1235 12/30/18 1150  NA 136 135  --  132*  K 6.0* 6.0* 4.2 5.5*  CL 97* 95*  --  96*  CO2 22 21*  --  23  GLUCOSE 83 74  --  99  BUN 35* 36*  --  38*  CREATININE 8.18* 8.49*  --  8.40*  CALCIUM 9.8 9.6  --  9.7  MG  --  2.1  --   --    GFR Estimated Creatinine Clearance: 9.2 mL/min (A) (by C-G formula based on SCr of 8.4 mg/dL  (H)). Liver Function Tests: No results for input(s): AST, ALT, ALKPHOS, BILITOT, PROT, ALBUMIN in the last 168 hours. No results for input(s): LIPASE, AMYLASE in the last 168 hours. No results for input(s): AMMONIA in the last 168 hours. Coagulation profile Recent Labs  Lab 12/26/18 1110  INR 1.2*    CBC: Recent Labs  Lab 12/27/18 2134 12/28/18 0321  WBC 7.6 6.9  HGB 11.3* 11.0*  HCT 32.4* 31.5*  MCV 80.4 79.7*  PLT 114* 111*   Cardiac Enzymes: Recent Labs  Lab 12/27/18 2134 12/28/18 0304 12/28/18 0321 12/28/18 1235 12/28/18 1838  TROPONINI 0.07* 0.07* 0.07* 0.07* 0.07*   BNP: Invalid input(s): POCBNP CBG: Recent Labs  Lab 12/28/18 0413  GLUCAP 75   D-Dimer No results for input(s): DDIMER in the last 72 hours. Hgb A1c No results for input(s): HGBA1C in the last 72 hours. Lipid Profile No results for input(s): CHOL, HDL, LDLCALC, TRIG, CHOLHDL, LDLDIRECT in the last 72 hours. Thyroid function studies No results for input(s): TSH, T4TOTAL, T3FREE, THYROIDAB in the last 72 hours.  Invalid input(s): FREET3 Anemia work up No results for input(s): VITAMINB12, FOLATE, FERRITIN, TIBC, IRON, RETICCTPCT in  the last 72 hours. Microbiology Recent Results (from the past 240 hour(s))  SARS Coronavirus 2     Status: None   Collection Time: 12/28/18  2:50 AM  Result Value Ref Range Status   SARS Coronavirus 2 NOT DETECTED NOT DETECTED Final    Comment: (NOTE) SARS-CoV-2 target nucleic acids are NOT DETECTED. The SARS-CoV-2 RNA is generally detectable in upper and lower respiratory specimens during the acute phase of infection.  Negative  results do not preclude SARS-CoV-2 infection, do not rule out co-infections with other pathogens, and should not be used as the sole basis for treatment or other patient management decisions.  Negative results must be combined with clinical observations, patient history, and epidemiological information. The expected result is Not  Detected. Fact Sheet for Patients: http://www.biofiredefense.com/wp-content/uploads/2020/03/BIOFIRE-COVID -19-patients.pdf Fact Sheet for Healthcare Providers: http://www.biofiredefense.com/wp-content/uploads/2020/03/BIOFIRE-COVID -19-hcp.pdf This test is not yet approved or cleared by the [place] and  has been authorized for detection and/or diagnosis of SARS-CoV-2 by FDA under an Emergency Use Authorization (EUA).  This EUA will remain in effec t (meaning this test can be used) for the duration of  the COVID-19 declaration under Section 564(b)(1) of the Act, 21 U.S.C. section 360bbb-3(b)(1), unless the authorization is terminated or revoked sooner. Performed at [facility], [place] [address], [place], [place] [address]   Gram stain     Status: None   Collection Time: 12/28/18  1:57 PM   Specimen: Peritoneal Cavity; Peritoneal Fluid  Result Value Ref Range Status   Specimen Description PERITONEAL  Final   Special Requests NONE  Final   Gram Stain   Final    RARE WBC PRESENT, PREDOMINANTLY MONONUCLEAR NO ORGANISMS SEEN Performed at [facility], [place] [address], [place], [place] [address]    Report Status 12/28/2018 FINAL  Final  Culture, body fluid-bottle     Status: None (Preliminary result)   Collection Time: 12/28/18  1:57 PM   Specimen: Peritoneal Washings  Result Value Ref Range Status   Specimen Description PERITONEAL  Final   Special Requests NONE  Final   Culture   Final    NO GROWTH < 24 HOURS Performed at [facility], [place] [address], [place], [place] [address]    Report Status PENDING  Incomplete     Discharge Instructions:   Discharge Instructions    Discharge instructions   Complete by: As directed    Renal diet with fluid restriction HD as previously scheduled Your GI doctor will arrange scheduled paracentesis   Increase activity slowly   Complete by: As directed      Allergies as of 12/30/2018      Reactions   Morphine  And Related Other (See Comments)   Stomach pain   Aspirin Other (See Comments)   STOMACH PAIN   Clonidine Derivatives Itching   Tramadol Itching   Tylenol [acetaminophen] Nausea Only   Stomach ache      Medication List    STOP taking these medications   aspirin 81 MG EC tablet   hydrALAZINE 100 MG tablet Commonly known as: APRESOLINE     TAKE these medications   [employer] Stool Softener 250 MG capsule Generic drug: docusate sodium Take 250 mg by mouth daily.   lactulose 10 GM/15ML solution Commonly known as: CHRONULAC Take 45 mLs (30 g total) by mouth 2 (two) times daily.   metoprolol tartrate 25 MG tablet Commonly known as: LOPRESSOR Take 1 tablet (25 mg total) by mouth 2 (two)  times daily.   montelukast 10 MG tablet Commonly known as: SINGULAIR Take 10 mg by mouth daily.   ondansetron 4 MG tablet Commonly known as: ZOFRAN Take 4 mg by mouth every 8 (eight) hours as needed for nausea or vomiting.   pantoprazole 40 MG tablet Commonly known as: PROTONIX Take 1 tablet (40 mg total) by mouth See admin instructions. Take one tablet (40 mg) by mouth in the morning on Sunday, Tuesday, Thursday, Saturday, take one tablet (40 mg) after dialysis on Monday, Wednesday, Friday. May also take one tablet (40 mg) in the evening as needed for acid reflux.   sevelamer carbonate 800 MG tablet Commonly known as: RENVELA Take 4 tablets with meals  And 2 tablets twice daily with snacks   sorbitol 70 % Soln Take 30 mLs by mouth 3 (three) times daily as needed for constipation.   sulfamethoxazole-trimethoprim 800-160 MG tablet Commonly known as: BACTRIM DS Take 1 tablet by mouth daily. What changed: when to take this   Symbicort 160-4.5 MCG/ACT inhaler Generic drug: budesonide-formoterol Inhale 2 puffs into the lungs 2 (two) times a day.   Ventolin HFA 108 (90 Base) MCG/ACT inhaler Generic drug: albuterol Inhale 2 puffs into the lungs every 6 (six) hours as needed for wheezing or  shortness of breath.   zolpidem 5 MG tablet Commonly known as: AMBIEN Take 5 mg by mouth at bedtime.      Follow-up Information    [name], FNP Follow up in 1 week(s).   Specialty: Family Medicine Contact information: [address] [place] [address] [phone]            Time coordinating discharge: 35 min  Signed:  Geradine Girt DO  [facility] 12/30/2018, 2:11 PM

## 2018-12-30 NOTE — Progress Notes (Signed)
Pt refused standing weight and CHG bath for hemodialysis. Will continue to monitor pt.

## 2019-01-01 ENCOUNTER — Other Ambulatory Visit: Payer: Self-pay

## 2019-01-01 ENCOUNTER — Observation Stay (HOSPITAL_COMMUNITY)
Admission: EM | Admit: 2019-01-01 | Discharge: 2019-01-02 | Disposition: A | Discharge status: Home / Self Care | Payer: Medicare Other | Attending: Internal Medicine | Admitting: Internal Medicine

## 2019-01-01 ENCOUNTER — Encounter (HOSPITAL_COMMUNITY): Payer: Self-pay | Admitting: Emergency Medicine

## 2019-01-01 ENCOUNTER — Emergency Department (HOSPITAL_COMMUNITY): Payer: Medicare Other

## 2019-01-01 DIAGNOSIS — J449 Chronic obstructive pulmonary disease, unspecified: Secondary | ICD-10-CM | POA: Insufficient documentation

## 2019-01-01 DIAGNOSIS — Z955 Presence of coronary angioplasty implant and graft: Secondary | ICD-10-CM | POA: Diagnosis not present

## 2019-01-01 DIAGNOSIS — I484 Atypical atrial flutter: Secondary | ICD-10-CM | POA: Diagnosis present

## 2019-01-01 DIAGNOSIS — I4891 Unspecified atrial fibrillation: Secondary | ICD-10-CM | POA: Diagnosis not present

## 2019-01-01 DIAGNOSIS — Z7951 Long term (current) use of inhaled steroids: Secondary | ICD-10-CM | POA: Diagnosis not present

## 2019-01-01 DIAGNOSIS — I2510 Atherosclerotic heart disease of native coronary artery without angina pectoris: Secondary | ICD-10-CM | POA: Insufficient documentation

## 2019-01-01 DIAGNOSIS — Z992 Dependence on renal dialysis: Secondary | ICD-10-CM | POA: Insufficient documentation

## 2019-01-01 DIAGNOSIS — K219 Gastro-esophageal reflux disease without esophagitis: Secondary | ICD-10-CM | POA: Insufficient documentation

## 2019-01-01 DIAGNOSIS — R778 Other specified abnormalities of plasma proteins: Secondary | ICD-10-CM

## 2019-01-01 DIAGNOSIS — R000 Tachycardia, unspecified: Secondary | ICD-10-CM | POA: Diagnosis not present

## 2019-01-01 DIAGNOSIS — Z1159 Encounter for screening for other viral diseases: Secondary | ICD-10-CM | POA: Insufficient documentation

## 2019-01-01 DIAGNOSIS — I700 Atherosclerosis of aorta: Secondary | ICD-10-CM | POA: Insufficient documentation

## 2019-01-01 DIAGNOSIS — I252 Old myocardial infarction: Secondary | ICD-10-CM | POA: Insufficient documentation

## 2019-01-01 DIAGNOSIS — D509 Iron deficiency anemia, unspecified: Secondary | ICD-10-CM | POA: Diagnosis not present

## 2019-01-01 DIAGNOSIS — J810 Acute pulmonary edema: Secondary | ICD-10-CM | POA: Diagnosis not present

## 2019-01-01 DIAGNOSIS — I499 Cardiac arrhythmia, unspecified: Secondary | ICD-10-CM | POA: Diagnosis not present

## 2019-01-01 DIAGNOSIS — F1410 Cocaine abuse, uncomplicated: Secondary | ICD-10-CM | POA: Diagnosis not present

## 2019-01-01 DIAGNOSIS — Z87442 Personal history of urinary calculi: Secondary | ICD-10-CM | POA: Insufficient documentation

## 2019-01-01 DIAGNOSIS — D631 Anemia in chronic kidney disease: Secondary | ICD-10-CM | POA: Diagnosis not present

## 2019-01-01 DIAGNOSIS — I120 Hypertensive chronic kidney disease with stage 5 chronic kidney disease or end stage renal disease: Secondary | ICD-10-CM | POA: Insufficient documentation

## 2019-01-01 DIAGNOSIS — M19012 Primary osteoarthritis, left shoulder: Secondary | ICD-10-CM | POA: Insufficient documentation

## 2019-01-01 DIAGNOSIS — K7030 Alcoholic cirrhosis of liver without ascites: Secondary | ICD-10-CM | POA: Diagnosis not present

## 2019-01-01 DIAGNOSIS — Z8249 Family history of ischemic heart disease and other diseases of the circulatory system: Secondary | ICD-10-CM | POA: Insufficient documentation

## 2019-01-01 DIAGNOSIS — E875 Hyperkalemia: Principal | ICD-10-CM | POA: Insufficient documentation

## 2019-01-01 DIAGNOSIS — N2581 Secondary hyperparathyroidism of renal origin: Secondary | ICD-10-CM | POA: Insufficient documentation

## 2019-01-01 DIAGNOSIS — I4892 Unspecified atrial flutter: Secondary | ICD-10-CM | POA: Diagnosis not present

## 2019-01-01 DIAGNOSIS — B181 Chronic viral hepatitis B without delta-agent: Secondary | ICD-10-CM | POA: Diagnosis not present

## 2019-01-01 DIAGNOSIS — K7460 Unspecified cirrhosis of liver: Secondary | ICD-10-CM | POA: Diagnosis present

## 2019-01-01 DIAGNOSIS — N19 Unspecified kidney failure: Secondary | ICD-10-CM | POA: Diagnosis not present

## 2019-01-01 DIAGNOSIS — I447 Left bundle-branch block, unspecified: Secondary | ICD-10-CM | POA: Diagnosis not present

## 2019-01-01 DIAGNOSIS — B1920 Unspecified viral hepatitis C without hepatic coma: Secondary | ICD-10-CM | POA: Insufficient documentation

## 2019-01-01 DIAGNOSIS — R7989 Other specified abnormal findings of blood chemistry: Secondary | ICD-10-CM | POA: Diagnosis not present

## 2019-01-01 DIAGNOSIS — K7290 Hepatic failure, unspecified without coma: Secondary | ICD-10-CM | POA: Diagnosis present

## 2019-01-01 DIAGNOSIS — Z23 Encounter for immunization: Secondary | ICD-10-CM | POA: Diagnosis not present

## 2019-01-01 DIAGNOSIS — R0602 Shortness of breath: Secondary | ICD-10-CM | POA: Diagnosis not present

## 2019-01-01 DIAGNOSIS — Z209 Contact with and (suspected) exposure to unspecified communicable disease: Secondary | ICD-10-CM | POA: Diagnosis not present

## 2019-01-01 DIAGNOSIS — N186 End stage renal disease: Secondary | ICD-10-CM | POA: Diagnosis not present

## 2019-01-01 DIAGNOSIS — F419 Anxiety disorder, unspecified: Secondary | ICD-10-CM | POA: Diagnosis not present

## 2019-01-01 DIAGNOSIS — Z79899 Other long term (current) drug therapy: Secondary | ICD-10-CM | POA: Insufficient documentation

## 2019-01-01 DIAGNOSIS — Z9115 Patient's noncompliance with renal dialysis: Secondary | ICD-10-CM | POA: Insufficient documentation

## 2019-01-01 DIAGNOSIS — F17210 Nicotine dependence, cigarettes, uncomplicated: Secondary | ICD-10-CM | POA: Insufficient documentation

## 2019-01-01 MED ORDER — SODIUM CHLORIDE 0.9% FLUSH: 3.0000 mL | Freq: Once | INTRAVENOUS | Status: DC

## 2019-01-01 NOTE — ED Triage Notes (Signed)
Per EMS pt is SOB, missed dialysis b/c "they don't treat me right."  Pt demanded to be placed on O2 even thought vitals were stable.

## 2019-01-02 ENCOUNTER — Encounter (HOSPITAL_COMMUNITY): Payer: Self-pay | Admitting: Emergency Medicine

## 2019-01-02 DIAGNOSIS — N19 Unspecified kidney failure: Secondary | ICD-10-CM

## 2019-01-02 DIAGNOSIS — E875 Hyperkalemia: Secondary | ICD-10-CM

## 2019-01-02 DIAGNOSIS — Z992 Dependence on renal dialysis: Secondary | ICD-10-CM | POA: Diagnosis not present

## 2019-01-02 DIAGNOSIS — J810 Acute pulmonary edema: Secondary | ICD-10-CM | POA: Diagnosis not present

## 2019-01-02 DIAGNOSIS — N186 End stage renal disease: Secondary | ICD-10-CM

## 2019-01-02 DIAGNOSIS — K7031 Alcoholic cirrhosis of liver with ascites: Secondary | ICD-10-CM | POA: Diagnosis not present

## 2019-01-02 DIAGNOSIS — I4892 Unspecified atrial flutter: Secondary | ICD-10-CM

## 2019-01-02 DIAGNOSIS — R7989 Other specified abnormal findings of blood chemistry: Secondary | ICD-10-CM

## 2019-01-02 LAB — BASIC METABOLIC PANEL
Anion gap: 20 — ABNORMAL HIGH (ref 5–15)
Anion gap: 19 — ABNORMAL HIGH (ref 5–15)
BUN: 63 mg/dL — ABNORMAL HIGH (ref 6–20)
BUN: 65 mg/dL — ABNORMAL HIGH (ref 6–20)
CO2: 17 mmol/L — ABNORMAL LOW (ref 22–32)
CO2: 18 mmol/L — ABNORMAL LOW (ref 22–32)
Calcium: 10.9 mg/dL — ABNORMAL HIGH (ref 8.9–10.3)
Calcium: 10.8 mg/dL — ABNORMAL HIGH (ref 8.9–10.3)
Chloride: 94 mmol/L — ABNORMAL LOW (ref 98–111)
Chloride: 96 mmol/L — ABNORMAL LOW (ref 98–111)
Creatinine, Ser: 11.54 mg/dL — ABNORMAL HIGH (ref 0.61–1.24)
Creatinine, Ser: 11.94 mg/dL — ABNORMAL HIGH (ref 0.61–1.24)
GFR calc Af Amer: 5 mL/min — ABNORMAL LOW (ref >60)
GFR calc Af Amer: 5 mL/min — ABNORMAL LOW (ref >60)
GFR calc non Af Amer: 4 mL/min — ABNORMAL LOW (ref >60)
GFR calc non Af Amer: 4 mL/min — ABNORMAL LOW (ref >60)
Glucose, Bld: 108 mg/dL — ABNORMAL HIGH (ref 70–99)
Glucose, Bld: 69 mg/dL — ABNORMAL LOW (ref 70–99)
Potassium: 6.7 mmol/L (ref 3.5–5.1)
Potassium: 6.3 mmol/L (ref 3.5–5.1)
Sodium: 131 mmol/L — ABNORMAL LOW (ref 135–145)
Sodium: 133 mmol/L — ABNORMAL LOW (ref 135–145)

## 2019-01-02 LAB — CBC
HCT: 30.4 % — ABNORMAL LOW (ref 39.0–52.0)
Hemoglobin: 11 g/dL — ABNORMAL LOW (ref 13.0–17.0)
MCH: 28.4 pg (ref 26.0–34.0)
MCHC: 36.2 g/dL — ABNORMAL HIGH (ref 30.0–36.0)
MCV: 78.6 fL — ABNORMAL LOW (ref 80.0–100.0)
Platelets: 114 10*3/uL — ABNORMAL LOW (ref 150–400)
RBC: 3.87 MIL/uL — ABNORMAL LOW (ref 4.22–5.81)
RDW: 15.8 % — ABNORMAL HIGH (ref 11.5–15.5)
WBC: 9.7 10*3/uL (ref 4.0–10.5)
nRBC: 0 % (ref 0.0–0.2)

## 2019-01-02 LAB — CULTURE, BODY FLUID W GRAM STAIN -BOTTLE: Culture: NO GROWTH

## 2019-01-02 LAB — TROPONIN I
Troponin I: 0.18 ng/mL (ref <0.03)
Troponin I: 0.19 ng/mL (ref <0.03)

## 2019-01-02 LAB — SARS CORONAVIRUS 2 BY RT PCR (HOSPITAL ORDER, PERFORMED IN ~~LOC~~ HOSPITAL LAB): SARS Coronavirus 2: NEGATIVE

## 2019-01-02 LAB — CBG MONITORING, ED: Glucose-Capillary: 110 mg/dL — ABNORMAL HIGH (ref 70–99)

## 2019-01-02 MED ORDER — MONTELUKAST SODIUM 10 MG PO TABS
10.0000 mg | ORAL_TABLET | Freq: Every day | ORAL | Status: DC
  Filled 2019-01-02: qty 1

## 2019-01-02 MED ORDER — SODIUM CHLORIDE 0.9 % IV SOLN: 100.0000 mL | INTRAVENOUS | Status: DC | PRN

## 2019-01-02 MED ORDER — ACETAMINOPHEN 325 MG PO TABS
ORAL_TABLET | ORAL | Status: AC
  Filled 2019-01-02: qty 2

## 2019-01-02 MED ORDER — CALCIUM GLUCONATE-NACL 1-0.675 GM/50ML-% IV SOLN: 1.0000 g | Freq: Once | INTRAVENOUS | Status: DC

## 2019-01-02 MED ORDER — METOPROLOL TARTRATE 25 MG PO TABS: 25.0000 mg | ORAL_TABLET | Freq: Two times a day (BID) | ORAL | Status: DC

## 2019-01-02 MED ORDER — HEPARIN SODIUM (PORCINE) 5000 UNIT/ML IJ SOLN: 5000.0000 [IU] | Freq: Three times a day (TID) | INTRAMUSCULAR | Status: DC

## 2019-01-02 MED ORDER — DOCUSATE SODIUM 100 MG PO CAPS: 200.0000 mg | ORAL_CAPSULE | Freq: Every day | ORAL | Status: DC

## 2019-01-02 MED ORDER — MOMETASONE FURO-FORMOTEROL FUM 200-5 MCG/ACT IN AERO
2.0000 | INHALATION_SPRAY | Freq: Two times a day (BID) | RESPIRATORY_TRACT | Status: DC
  Filled 2019-01-02: qty 8.8

## 2019-01-02 MED ORDER — ZOLPIDEM TARTRATE 5 MG PO TABS: 5.0000 mg | ORAL_TABLET | Freq: Every day | ORAL | Status: DC

## 2019-01-02 MED ORDER — PENTAFLUOROPROP-TETRAFLUOROETH EX AERO
1.0000 "application " | INHALATION_SPRAY | CUTANEOUS | Status: DC | PRN
  Filled 2019-01-02: qty 116

## 2019-01-02 MED ORDER — SORBITOL 70 % SOLN
30.0000 mL | Freq: Three times a day (TID) | Status: DC | PRN
  Filled 2019-01-02: qty 30

## 2019-01-02 MED ORDER — LACTULOSE 10 GM/15ML PO SOLN
30.0000 g | Freq: Two times a day (BID) | ORAL | Status: DC
  Filled 2019-01-02: qty 45

## 2019-01-02 MED ORDER — LIDOCAINE-PRILOCAINE 2.5-2.5 % EX CREA
1.0000 "application " | TOPICAL_CREAM | CUTANEOUS | Status: DC | PRN
  Filled 2019-01-02: qty 5

## 2019-01-02 MED ORDER — ALBUTEROL SULFATE HFA 108 (90 BASE) MCG/ACT IN AERS
10.0000 | INHALATION_SPRAY | Freq: Once | RESPIRATORY_TRACT | Status: AC
  Administered 2019-01-02: 10 via RESPIRATORY_TRACT
  Filled 2019-01-02: qty 6.7

## 2019-01-02 MED ORDER — SEVELAMER CARBONATE 800 MG PO TABS
3200.0000 mg | ORAL_TABLET | Freq: Three times a day (TID) | ORAL | Status: DC
  Filled 2019-01-02 (×2): qty 4

## 2019-01-02 MED ORDER — ACETAMINOPHEN 325 MG PO TABS
650.0000 mg | ORAL_TABLET | Freq: Four times a day (QID) | ORAL | Status: DC | PRN
  Administered 2019-01-02: 650 mg via ORAL

## 2019-01-02 MED ORDER — HEPARIN SODIUM (PORCINE) 1000 UNIT/ML DIALYSIS
1000.0000 [IU] | INTRAMUSCULAR | Status: DC | PRN
  Filled 2019-01-02: qty 1

## 2019-01-02 MED ORDER — LIDOCAINE HCL (PF) 1 % IJ SOLN: 5.0000 mL | INTRAMUSCULAR | Status: DC | PRN

## 2019-01-02 MED ORDER — SODIUM CHLORIDE 0.9 % IV SOLN: 1.0000 g | Freq: Once | INTRAVENOUS | Status: DC

## 2019-01-02 MED ORDER — PANTOPRAZOLE SODIUM 40 MG PO TBEC: 40.0000 mg | DELAYED_RELEASE_TABLET | Freq: Every day | ORAL | Status: DC

## 2019-01-02 MED ORDER — CHLORHEXIDINE GLUCONATE CLOTH 2 % EX PADS: 6.0000 | MEDICATED_PAD | Freq: Every day | CUTANEOUS | Status: DC

## 2019-01-02 MED ORDER — SULFAMETHOXAZOLE-TRIMETHOPRIM 800-160 MG PO TABS: 1.0000 | ORAL_TABLET | Freq: Every day | ORAL | Status: DC

## 2019-01-02 MED ORDER — SEVELAMER CARBONATE 800 MG PO TABS
1600.0000 mg | ORAL_TABLET | Freq: Two times a day (BID) | ORAL | Status: DC | PRN
  Filled 2019-01-02: qty 2

## 2019-01-02 MED ORDER — ALBUTEROL SULFATE (2.5 MG/3ML) 0.083% IN NEBU: 2.5000 mg | INHALATION_SOLUTION | Freq: Four times a day (QID) | RESPIRATORY_TRACT | Status: DC | PRN

## 2019-01-02 MED ORDER — ONDANSETRON HCL 4 MG PO TABS: 4.0000 mg | ORAL_TABLET | Freq: Three times a day (TID) | ORAL | Status: DC | PRN

## 2019-01-02 MED ORDER — CALCIUM GLUCONATE-NACL 1-0.675 GM/50ML-% IV SOLN
1.0000 g | Freq: Once | INTRAVENOUS | Status: AC
  Administered 2019-01-02: 1000 mg via INTRAVENOUS
  Filled 2019-01-02: qty 50

## 2019-01-02 MED ORDER — DEXTROSE 50 % IV SOLN
1.0000 | Freq: Once | INTRAVENOUS | Status: AC
  Administered 2019-01-02: 50 mL via INTRAVENOUS
  Filled 2019-01-02: qty 50

## 2019-01-02 MED ORDER — INSULIN ASPART 100 UNIT/ML IV SOLN
5.0000 [IU] | Freq: Once | INTRAVENOUS | Status: AC
  Administered 2019-01-02: 5 [IU] via INTRAVENOUS

## 2019-01-02 MED ORDER — SODIUM BICARBONATE 8.4 % IV SOLN
50.0000 meq | Freq: Once | INTRAVENOUS | Status: AC
  Administered 2019-01-02: 50 meq via INTRAVENOUS
  Filled 2019-01-02: qty 50

## 2019-01-02 MED ORDER — SODIUM BICARBONATE 8.4 % IV SOLN
INTRAVENOUS | Status: DC
  Administered 2019-01-02: 04:00:00 via INTRAVENOUS
  Filled 2019-01-02: qty 150

## 2019-01-02 MED ORDER — PANTOPRAZOLE SODIUM 40 MG PO TBEC: 40.0000 mg | DELAYED_RELEASE_TABLET | Freq: Every day | ORAL | Status: DC | PRN

## 2019-01-02 NOTE — ED Notes (Signed)
Dr. Gardner at bedside 

## 2019-01-02 NOTE — Progress Notes (Signed)
Admit: 01/01/2019 LOS: 0  Subjective:  . Patient represents the emergency room after admission on 6/18 for acute hyperkalemia and dyspnea related to dialysis noncompliance. Marland Kitchen He received HD on 6/18 but did not receive treatment on 6/19 or 6/20; patient refused.  He left the hospital on 6/20 . Patient presented to outpatient HD unit this morning, he was dissatisfied with his PCT and refused treatment, leaving before any therapy. Marland Kitchen He developed dyspnea thereafter and presented to the emergency room . Potassium is 6.7, EKG with widening of QRS (some of this is chronic), discharge potassium was 5.5 . Troponin is weakly positive. . Patient states his dyspnea has resolved, he is on room air. Marland Kitchen He states that he must eat before dialysis and that nobody is listening to him. . In ER has received calcium gluconate, sodium bicarbonate, insulin/dextrose    Scheduled Meds: . Chlorhexidine Gluconate Cloth  6 each Topical Q0600  . sodium chloride flush  3 mL Intravenous Once   Continuous Infusions: . calcium gluconate 1,000 mg (01/02/19 0304)  .  sodium bicarbonate  infusion 1000 mL     PRN Meds:.  [facility] 4 [facility] 70.5. HD Bath2/2, Dialyzer180, HeparinNO. AccessAVG.Calcitriol 3 mcg per tx and parsabiv 7.5. Last labs hgb 11.3, phos 3.6, calc 9.5, pth 535 and alb 4.4  Physical Exam:  Blood pressure (!) 129/96, pulse 92, temperature 98 F (36.7 C), temperature source Oral, resp. rate 20, SpO2 100 %. NAD, somewhat disruptive/non-cooperative Tachycardic, regular.  No murmur or rub  Clear bilaterally, normal work of breathing. Abdomen is soft, nontender. No lower extremity edema Right upper arm AV graft with bruit and thrill, no ulcers or aneurysms present NCAT EOMI   A 1. ESRD MWF [place] via RUE AVG 2. Hyperkalemia with question of EKG changes due to noncompliance 3. Metabolic acidosis related to #1 and noncompliance 4. Anemia, stable 5. Chronic hepatitis B,  treatment and isolation, intermittent requirement for LVP  P . Dialysis now, start 1K for 60 minutes then 2K, 3 to 4 L ultrafiltration, no heparin, AVG, 4h . Patient wishes to transfer her outpatient HD units, can address as an outpatient   Pearson Grippe MD 01/02/2019, 3:38 AM  Recent Labs  Lab 12/28/18 0321 12/28/18 1235 12/30/18 1150 01/02/19 0046  NA 135  --  132* 131*  K 6.0* 4.2 5.5* 6.7*  CL 95*  --  96* 94*  CO2 21*  --  23 17*  GLUCOSE 74  --  99 108*  BUN 36*  --  38* 63*  CREATININE 8.49*  --  8.40* 11.54*  CALCIUM 9.6  --  9.7 10.9*   Recent Labs  Lab 12/27/18 2134 12/28/18 0321 01/02/19 0046  WBC 7.6 6.9 9.7  HGB 11.3* 11.0* 11.0*  HCT 32.4* 31.5* 30.4*  MCV 80.4 79.7* 78.6*  PLT 114* 111* 114*

## 2019-01-02 NOTE — ED Notes (Signed)
Pt transferred to dialysis with Nicki Reaper RN

## 2019-01-02 NOTE — Plan of Care (Signed)
  Problem: Health Behavior/Discharge Planning: Goal: Ability to manage health-related needs will improve Outcome: Adequate for Discharge   Problem: Clinical Measurements: Goal: Ability to maintain clinical measurements within normal limits will improve Outcome: Adequate for Discharge   Problem: Clinical Measurements: Goal: Diagnostic test results will improve Outcome: Adequate for Discharge

## 2019-01-02 NOTE — ED Provider Notes (Signed)
[facility] EMERGENCY DEPARTMENT Provider Note   CSN: [id number] Arrival date & time: 01/01/19  2204     History   Chief Complaint Chief Complaint  Patient presents with   Shortness of Breath   "fluid buildup"    HPI [name] is a 58 y.o. male.     The history is provided by the patient.  Shortness of Breath Severity:  Severe Onset quality:  Gradual Timing:  Constant Progression:  Worsening Chronicity:  Recurrent Context: not activity   Context comment:  Missed dialysis Relieved by:  Nothing Worsened by:  Nothing Ineffective treatments:  None tried Associated symptoms: no abdominal pain, no chest pain, no diaphoresis, no ear pain, no fever, no headaches, no hemoptysis and no sore throat   Risk factors: no recent surgery   Patient has not been to dialysis since Thursday.  No f/c/r.    Past Medical History:  Diagnosis Date   Anemia    Anxiety    Arthritis    left shoulder   Atherosclerosis of aorta (HCC)    Cardiomegaly    Chest pain    DATE UNKNOWN, C/O PERIODICALLY   Cocaine abuse (HCC)    COPD exacerbation (HCC) 08/17/2016   Coronary artery disease    stent 02/22/17   ESRD (end stage renal disease) on dialysis ([place])    "[name]; MWF" (07/04/2017)   GERD (gastroesophageal reflux disease)    DATE UNKNOWN   Hemorrhoids    Hepatitis B, chronic (HCC)    Hepatitis C    History of kidney stones    Hyperkalemia    Hypertension    Kidney failure    Metabolic bone disease    Patient denies   Mitral stenosis    Myocardial infarction [facility])    Pneumonia    Pulmonary edema    Solitary rectal ulcer syndrome 07/2017   at flex sig for rectal bleeding   Tubular adenoma of colon     Patient Active Problem List   Diagnosis Date Noted   Volume overload 12/28/2018   Sepsis ([place]) 09/12/2018   Atherosclerosis of native coronary artery of native heart without angina pectoris 03/11/2018   Benign  neoplasm of cecum    Benign neoplasm of ascending colon    Benign neoplasm of descending colon    Benign neoplasm of rectum    Paroxysmal atrial fibrillation ([place]) 01/23/2018   Hx of colonic polyps 01/20/2018   ESRD (end stage renal disease) ([place]) 11/21/2017   GERD (gastroesophageal reflux disease) 11/16/2017   Liver cirrhosis ([place]) 11/15/2017   DNR (do not resuscitate)    Palliative care by specialist    Hyponatremia 11/04/2017   SBP (spontaneous bacterial peritonitis) ([place]) 10/30/2017   Liver disease, chronic 10/30/2017   SOB (shortness of breath)    Abdominal pain 10/28/2017   Upper airway cough syndrome with flattening on f/v loop 10/13/17 c/w vcd 10/17/2017   Elevated diaphragm 10/13/2017   Ileus ([place]) 09/29/2017   Malnutrition of moderate degree 09/29/2017   Sinus congestion 09/03/2017   Symptomatic anemia 09/02/2017   Other cirrhosis of liver ([place]) 09/02/2017   Left bundle branch block 09/02/2017   Mitral stenosis 09/02/2017   Hematochezia 07/15/2017   Wide-complex tachycardia ([place])    Endotracheally intubated    ESRD on dialysis ([place]) 07/04/2017   Acute respiratory failure with hypoxia ([place]) 06/18/2017   CKD (chronic kidney disease) stage V requiring chronic dialysis ([place]) 06/18/2017   History of Cocaine abuse ([place]) 06/18/2017  Hypertension 06/18/2017   Infection of AV graft for dialysis ([place]) 06/18/2017   Anxiety 06/18/2017   Anemia due to chronic kidney disease 06/18/2017   Atrial flutter with rapid ventricular response ([place]) 06/18/2017   Personality disorder ([place]) 06/13/2017   Cellulitis 06/12/2017   Adjustment disorder with mixed anxiety and depressed mood 06/10/2017   Suicidal ideation 06/10/2017   Arm wound, left, sequela 06/10/2017   Dyspnea on exertion 05/29/2017   Tachycardia 05/29/2017   Hyperkalemia 58/85/0277   Acute metabolic encephalopathy    Anemia 04/23/2017   Ascites 04/23/2017   COPD (chronic  obstructive pulmonary disease) ([place]) 04/23/2017   Acute on chronic respiratory failure with hypoxia ([place]) 03/25/2017   Arrhythmia 03/25/2017   COPD GOLD 0 with flattening on inps f/v  09/27/2016   Essential hypertension 09/27/2016   Fluid overload 08/30/2016   COPD exacerbation ([place]) 08/17/2016   Hypertensive urgency 08/17/2016   Respiratory failure ([place]) 08/17/2016   Problem with dialysis access ([place]) 07/23/2016   Chronic hepatitis B ([place]) 03/05/2014   Chronic hepatitis C without hepatic coma ([place]) 03/05/2014   Internal hemorrhoids with bleeding, swelling and itching 03/05/2014   Thrombocytopenia ([place]) 03/05/2014   Chest pain 02/27/2014   Alcohol abuse 04/14/2009   Cigarette smoker 04/14/2009   GANGLION CYST 04/14/2009    Past Surgical History:  Procedure Laterality Date   A/V FISTULAGRAM Left 05/26/2017   Procedure: A/V FISTULAGRAM;  Surgeon: Conrad Crescent Mills, MD;  Location: [place] [facility];  Service: Cardiovascular;  Laterality: Left;   A/V FISTULAGRAM Right 11/18/2017   Procedure: A/V FISTULAGRAM - Right Arm;  Surgeon: Elam Dutch, MD;  Location: [place] CV LAB;  Service: Cardiovascular;  Laterality: Right;   APPLICATION OF WOUND VAC Left 06/14/2017   Procedure: APPLICATION OF WOUND VAC;  Surgeon: Katha Cabal, MD;  Location: [facility] ORS;  Service: Vascular;  Laterality: Left;   AV FISTULA PLACEMENT  2012   BELIEVED WAS PLACED IN JUNE   AV FISTULA PLACEMENT Right 08/09/2017   Procedure: Creation Right arm ARTERIOVENOUS BRACHIOCEPOHALIC FISTULA;  Surgeon: Elam Dutch, MD;  Location: [facility] OR;  Service: Vascular;  Laterality: Right;   AV FISTULA PLACEMENT Right 11/22/2017   Procedure: INSERTION OF ARTERIOVENOUS (AV) GORE-TEX GRAFT RIGHT UPPER ARM;  Surgeon: Elam Dutch, MD;  Location: [place];  Service: Vascular;  Laterality: Right;   BIOPSY  01/25/2018   Procedure: BIOPSY;  Surgeon: Jerene Bears, MD;  Location: [place];  Service:  Gastroenterology;;   COLONOSCOPY     COLONOSCOPY WITH PROPOFOL N/A 01/25/2018   Procedure: COLONOSCOPY WITH PROPOFOL;  Surgeon: Jerene Bears, MD;  Location: [place];  Service: Gastroenterology;  Laterality: N/A;   CORONARY STENT INTERVENTION N/A 02/22/2017   Procedure: CORONARY STENT INTERVENTION;  Surgeon: Nigel Mormon, MD;  Location: [place] CV LAB;  Service: Cardiovascular;  Laterality: N/A;   ESOPHAGOGASTRODUODENOSCOPY (EGD) WITH PROPOFOL N/A 01/25/2018   Procedure: ESOPHAGOGASTRODUODENOSCOPY (EGD) WITH PROPOFOL;  Surgeon: Jerene Bears, MD;  Location: [place];  Service: Gastroenterology;  Laterality: N/A;   FLEXIBLE SIGMOIDOSCOPY N/A 07/15/2017   Procedure: FLEXIBLE SIGMOIDOSCOPY;  Surgeon: Carol Ada, MD;  Location: [place];  Service: Endoscopy;  Laterality: N/A;   HEMORRHOID BANDING     I&D EXTREMITY Left 06/01/2017   Procedure: IRRIGATION AND DEBRIDEMENT LEFT ARM HEMATOMA WITH LIGATION OF LEFT ARM AV FISTULA;  Surgeon: Elam Dutch, MD;  Location: [place];  Service: Vascular;  Laterality: Left;   I&D EXTREMITY Left 06/14/2017  Procedure: IRRIGATION AND DEBRIDEMENT EXTREMITY;  Surgeon: Katha Cabal, MD;  Location: [facility];  Service: Vascular;  Laterality: Left;   INSERTION OF DIALYSIS CATHETER  05/30/2017   INSERTION OF DIALYSIS CATHETER N/A 05/30/2017   Procedure: INSERTION OF DIALYSIS CATHETER;  Surgeon: Elam Dutch, MD;  Location: [place];  Service: Vascular;  Laterality: N/A;   IR PARACENTESIS  08/30/2017   IR PARACENTESIS  09/29/2017   IR PARACENTESIS  10/28/2017   IR PARACENTESIS  11/09/2017   IR PARACENTESIS  11/16/2017   IR PARACENTESIS  11/28/2017   IR PARACENTESIS  12/01/2017   IR PARACENTESIS  12/06/2017   IR PARACENTESIS  01/03/2018   IR PARACENTESIS  01/23/2018   IR PARACENTESIS  02/07/2018   IR PARACENTESIS  02/21/2018   IR PARACENTESIS  03/06/2018   IR PARACENTESIS  03/17/2018   IR PARACENTESIS  04/04/2018   IR  PARACENTESIS  12/28/2018   IR RADIOLOGIST EVAL & MGMT  02/14/2018   LEFT HEART CATH AND CORONARY ANGIOGRAPHY N/A 02/22/2017   Procedure: LEFT HEART CATH AND CORONARY ANGIOGRAPHY;  Surgeon: Nigel Mormon, MD;  Location: [place] [facility];  Service: Cardiovascular;  Laterality: N/A;   LIGATION OF ARTERIOVENOUS  FISTULA Left 09/11/6710   Procedure: Plication of Left Arm Arteriovenous Fistula;  Surgeon: Elam Dutch, MD;  Location: [facility] OR;  Service: Vascular;  Laterality: Left;   POLYPECTOMY     POLYPECTOMY  01/25/2018   Procedure: POLYPECTOMY;  Surgeon: Jerene Bears, MD;  Location: [place];  Service: Gastroenterology;;   REVISON OF ARTERIOVENOUS FISTULA Left 4/58/0998   Procedure: PLICATION OF DISTAL ANEURYSMAL SEGEMENT OF LEFT UPPER ARM ARTERIOVENOUS FISTULA;  Surgeon: Elam Dutch, MD;  Location: [place];  Service: Vascular;  Laterality: Left;   REVISON OF ARTERIOVENOUS FISTULA Left 3/38/2505   Procedure: Plication of Left Upper Arm Fistula ;  Surgeon: Waynetta Sandy, MD;  Location: [place];  Service: Vascular;  Laterality: Left;   SKIN GRAFT SPLIT THICKNESS LEG / FOOT Left    SKIN GRAFT SPLIT THICKNESS LEFT ARM DONOR SITE: LEFT ANTERIOR THIGH   SKIN SPLIT GRAFT Left 07/04/2017   Procedure: SKIN GRAFT SPLIT THICKNESS LEFT ARM DONOR SITE: LEFT ANTERIOR THIGH;  Surgeon: Elam Dutch, MD;  Location: [place];  Service: Vascular;  Laterality: Left;   THROMBECTOMY W/ EMBOLECTOMY Left 06/05/2017   Procedure: EXPLORATION OF LEFT ARM FOR BLEEDING; OVERSEWED PROXIMAL FISTULA;  Surgeon: Angelia Mould, MD;  Location: [place];  Service: Vascular;  Laterality: Left;   WOUND EXPLORATION Left 06/03/2017   Procedure: WOUND EXPLORATION WITH WOUND VAC APPLICATION TO LEFT ARM;  Surgeon: Angelia Mould, MD;  Location: [facility] OR;  Service: Vascular;  Laterality: Left;        Home Medications    Prior to Admission medications   Medication Sig Start Date End Date  Taking? Authorizing Provider  albuterol (VENTOLIN HFA) 108 (90 Base) MCG/ACT inhaler Inhale 2 puffs into the lungs every 6 (six) hours as needed for wheezing or shortness of breath.    [provider]  docusate sodium ([employer] STOOL SOFTENER) 250 MG capsule Take 250 mg by mouth daily.    [provider]  lactulose (CHRONULAC) 10 GM/15ML solution Take 45 mLs (30 g total) by mouth 2 (two) times daily. 12/26/18   [place], Amy S, PA-C  metoprolol tartrate (LOPRESSOR) 25 MG tablet Take 1 tablet (25 mg total) by mouth 2 (two) times daily. 03/11/18   Geradine Girt, DO  montelukast (SINGULAIR) 10 MG tablet Take 10 mg by mouth daily.    [provider]  ondansetron (ZOFRAN) 4 MG tablet Take 4 mg by mouth every 8 (eight) hours as needed for nausea or vomiting.    [provider]  pantoprazole (PROTONIX) 40 MG tablet Take 1 tablet (40 mg total) by mouth See admin instructions. Take one tablet (40 mg) by mouth in the morning on Sunday, Tuesday, Thursday, Saturday, take one tablet (40 mg) after dialysis on Monday, Wednesday, Friday. May also take one tablet (40 mg) in the evening as needed for acid reflux. 12/26/18   Esterwood, Amy S, PA-C  sevelamer carbonate (RENVELA) 800 MG tablet Take 4 tablets with meals  And 2 tablets twice daily with snacks Patient not taking: Reported on 12/28/2018 01/22/18   Patrecia Pour, MD  sorbitol 70 % SOLN Take 30 mLs by mouth 3 (three) times daily as needed for constipation. 12/07/18   [provider]  sulfamethoxazole-trimethoprim (BACTRIM DS) 800-160 MG tablet Take 1 tablet by mouth daily. 12/30/18   Geradine Girt, DO  SYMBICORT 160-4.5 MCG/ACT inhaler Inhale 2 puffs into the lungs 2 (two) times a day. 11/30/18   [provider]  zolpidem (AMBIEN) 5 MG tablet Take 5 mg by mouth at bedtime. 12/08/18   [provider]    Family History Family History  Problem Relation Age of Onset   Heart disease Mother    Lung cancer  Mother    Heart disease Father    Malignant hyperthermia Father    COPD Father    Throat cancer Sister    Esophageal cancer Sister    Hypertension Other    COPD Other    Colon cancer Neg Hx    Colon polyps Neg Hx    Rectal cancer Neg Hx    Stomach cancer Neg Hx     Social History Social History   Tobacco Use   Smoking status: Current Every Day Smoker    Packs/day: 0.50    Years: 43.00    Pack years: 21.50    Types: Cigarettes    Start date: 08/13/1973   Smokeless tobacco: Never Used  Substance Use Topics   Alcohol use: Not Currently    Frequency: Never    Comment: quit drinking in 2017   Drug use: Yes    Types: Marijuana     Allergies   Morphine and related, Aspirin, Clonidine derivatives, Tramadol, and Tylenol [acetaminophen]   Review of Systems Review of Systems  Constitutional: Negative for diaphoresis and fever.  HENT: Negative for ear pain and sore throat.   Respiratory: Positive for shortness of breath. Negative for hemoptysis and chest tightness.   Cardiovascular: Negative for chest pain.  Gastrointestinal: Negative for abdominal pain.  Neurological: Negative for headaches.  All other systems reviewed and are negative.    Physical Exam Updated Vital Signs BP (!) 129/96    Pulse 92    Temp 98 F (36.7 C) (Oral)    Resp 20    SpO2 100%   Physical Exam Vitals signs and nursing note reviewed.  Constitutional:      General: He is not in acute distress.    Appearance: He is normal weight.  HENT:     Head: Normocephalic and atraumatic.     Nose: Nose normal.  Eyes:     Conjunctiva/sclera: Conjunctivae normal.     Pupils: Pupils are equal, round, and reactive to light.  Neck:     Musculoskeletal: Normal  range of motion and neck supple.  Cardiovascular:     Rate and Rhythm: Normal rate. Rhythm regularly irregular.  Pulmonary:     Effort: No respiratory distress.     Breath sounds: No stridor. Decreased breath sounds present.    Abdominal:     General: Abdomen is flat. Bowel sounds are normal.     Tenderness: There is no abdominal tenderness. There is no guarding.     Hernia: No hernia is present.  Musculoskeletal: Normal range of motion.  Skin:    General: Skin is warm and dry.     Capillary Refill: Capillary refill takes less than 2 seconds.  Neurological:     General: No focal deficit present.     Mental Status: He is alert and oriented to person, place, and time.  Psychiatric:        Mood and Affect: Mood normal.        Behavior: Behavior normal.       ED Treatments / Results  Labs (all labs ordered are listed, but only abnormal results are displayed) Results for orders placed or performed during the hospital encounter of 35/70/17  Basic metabolic panel  Result Value Ref Range   Sodium 131 (L) 135 - 145 mmol/L   Potassium 6.7 (HH) 3.5 - 5.1 mmol/L   Chloride 94 (L) 98 - 111 mmol/L   CO2 17 (L) 22 - 32 mmol/L   Glucose, Bld 108 (H) 70 - 99 mg/dL   BUN 63 (H) 6 - 20 mg/dL   Creatinine, Ser 11.54 (H) 0.61 - 1.24 mg/dL   Calcium 10.9 (H) 8.9 - 10.3 mg/dL   GFR calc non Af Amer 4 (L) >60 mL/min   GFR calc Af Amer 5 (L) >60 mL/min   Anion gap 20 (H) 5 - 15  CBC  Result Value Ref Range   WBC 9.7 4.0 - 10.5 K/uL   RBC 3.87 (L) 4.22 - 5.81 MIL/uL   Hemoglobin 11.0 (L) 13.0 - 17.0 g/dL   HCT 30.4 (L) 39.0 - 52.0 %   MCV 78.6 (L) 80.0 - 100.0 fL   MCH 28.4 26.0 - 34.0 pg   MCHC 36.2 (H) 30.0 - 36.0 g/dL   RDW 15.8 (H) 11.5 - 15.5 %   Platelets 114 (L) 150 - 400 K/uL   nRBC 0.0 0.0 - 0.2 %  Troponin I - ONCE - STAT  Result Value Ref Range   Troponin I 0.18 (HH) <0.03 ng/mL  CBG monitoring, ED  Result Value Ref Range   Glucose-Capillary 110 (H) 70 - 99 mg/dL   Dg Chest 2 View  Result Date: 01/01/2019 CLINICAL DATA:  Shortness of breath. Missed dialysis. EXAM: CHEST - 2 VIEW COMPARISON:  12/27/2018 FINDINGS: Unchanged cardiomegaly. Unchanged mediastinal contours. Vascular congestion with mild  peribronchial thickening. No focal airspace disease or pleural effusion. No acute osseous abnormality. IMPRESSION: Cardiomegaly with vascular congestion. Mild peribronchial thickening may be pulmonary edema or bronchitis. Electronically Signed   By: Keith Rake M.D.   On: 01/01/2019 23:27   Dg Chest 2 View  Result Date: 12/27/2018 CLINICAL DATA:  Shortness of breath EXAM: CHEST - 2 VIEW COMPARISON:  12/10/2018, 09/21/2018, 03/10/2018 FINDINGS: Mild elevation of left diaphragm. No pleural effusion. Cardiomegaly with vascular congestion. Aortic atherosclerosis. No pneumothorax. IMPRESSION: Cardiomegaly with vascular congestion. Electronically Signed   By: Donavan Foil M.D.   On: 12/27/2018 21:50   Ct Abdomen Pelvis W Contrast  Result Date: 12/17/2018 CLINICAL DATA:  Abdominal pain and diarrhea.  Chronic renal failure EXAM: CT ABDOMEN AND PELVIS WITH CONTRAST TECHNIQUE: Multidetector CT imaging of the abdomen and pelvis was performed using the standard protocol following bolus administration of intravenous contrast. CONTRAST:  137mL OMNIPAQUE IOHEXOL 300 MG/ML  SOLN COMPARISON:  August 03, 2018 FINDINGS: Lower chest: There is atelectatic change in the left base. Visualized lung bases otherwise are clear. There is cardiomegaly. There is a degree of left ventricular hypertrophy. There is calcification in the mitral valve. Hepatobiliary: The liver has a subtly nodular contour, consistent with cirrhosis. There is generalized decreased in liver attenuation, potentially with underlying hepatic steatosis. No focal liver lesions are evident. The portal and hepatic veins appear patent. Gallbladder is contracted there is no appreciable biliary duct dilatation. Pancreas: There is no pancreatic mass or inflammatory focus. Spleen: The spleen is normal in size and contour. No splenic lesions are evident. Adrenals/Urinary Tract: Adrenals bilaterally appear unremarkable. Native kidneys are atrophic bilaterally. There is  extensive vascular calcification in both main renal arteries as well as in peripheral renal arterial branches. There is an approximately 1 x 1 cm cyst arising from the medial upper pole left kidney. There is no hydronephrosis on either side. There is no evident renal or ureteral calculus on either side. Urinary bladder is virtually empty. There is no appreciable urinary bladder wall thickening given virtually empty state. Stomach/Bowel: There is no appreciable bowel wall or mesenteric thickening. A clip is noted in the rectum. There is no evident bowel obstruction. There is no free air or portal venous air. Vascular/Lymphatic: There is extensive aortic and mesenteric arterial vascular calcification. There is extensive generalized pelvic arterial vascular calcification. Penile artery calcification bilaterally noted. No adenopathy is evident in the abdomen or pelvis. Reproductive: Prostate and seminal vesicles appear normal in size and contour. There is mild prostatic calcification. Other: There is marked ascites throughout the abdomen and pelvis. No abscess evident in the abdomen or pelvis. There is no periappendiceal region inflammatory change. Musculoskeletal: Bones are diffusely sclerotic, consistent with secondary hyperparathyroidism, a consequence of chronic renal failure. No destructive bony changes evident. There is no evident intramuscular lesion. No abdominal wall lesions evident. IMPRESSION: 1.  Evidence of hepatic cirrhosis.  No focal liver lesions evident. 2.  Extensive ascites throughout the abdomen and pelvis. 3. No evident bowel obstruction. No evident abscess in the abdomen or pelvis. No inflammatory foci evident. 4. Atrophic kidneys without hydronephrosis. No renal or ureteral calculi. 5.  Marked diffuse arterial vascular calcification. 6. Diffuse bony sclerosis, a consequence of chronic secondary hyperparathyroidism. Electronically Signed   By: Lowella Grip III M.D.   On: 12/17/2018 13:58    US Paracentesis  Result Date: 12/14/2018 INDICATION: Patient with history of alcoholic cirrhosis with recurrent ascites. Request is made for diagnostic and therapeutic paracentesis. EXAM: ULTRASOUND GUIDED DIAGNOSTIC AND THERAPEUTIC PARACENTESIS MEDICATIONS: 10 mL 1% lidocaine COMPLICATIONS: None immediate. PROCEDURE: Informed written consent was obtained from the patient after a discussion of the risks, benefits and alternatives to treatment. A timeout was performed prior to the initiation of the procedure. Initial ultrasound scanning demonstrates a large amount of ascites within the left lower abdominal quadrant. The left lower abdomen was prepped and draped in the usual sterile fashion. 1% lidocaine was used for local anesthesia. Following this, a 19 gauge, 7-cm, Yueh catheter was introduced. An ultrasound image was saved for documentation purposes. The paracentesis was performed. The catheter was removed and a dressing was applied. The patient tolerated the procedure well without immediate post procedural complication. FINDINGS: A  total of approximately 2.45 L of hazy amber fluid was removed. Samples were sent to the laboratory as requested by the clinical team. IMPRESSION: Successful ultrasound-guided paracentesis yielding 2.45 L of peritoneal fluid. Read by: Earley Abide, PA-C Electronically Signed   By: Jerilynn Mages.  Shick M.D.   On: 12/13/2018 17:04   Dg Abd Acute W/chest  Result Date: 12/10/2018 CLINICAL DATA:  Constipation x3 days EXAM: DG ABDOMEN ACUTE W/ 1V CHEST COMPARISON:  Chest radiographs dated 09/21/2018. CT abdomen/pelvis dated 08/03/2018. FINDINGS: Lungs are essentially clear. Mild bibasilar atelectasis. No pleural effusion or pneumothorax. Cardiomegaly.  Thoracic aortic atherosclerosis. Mildly dilated loops of small bowel in the central abdomen, raising the possibility of partial small bowel obstruction. However, the splenic flexure is visualized and appears normal (not decompressed). Paucity  of bowel loops in the periphery suggest ascites, which may also exacerbate this appearance. Normal colonic stool burden. No evidence of free air under the diaphragm on the upright view. Vascular calcifications. Visualized osseous structures are within normal limits. IMPRESSION: Normal colonic stool burden. Paucity of bowel loops in the periphery of the abdomen suggests ascites. Mildly dilated loops of small bowel in the central abdomen at least raises the possibility of partial small bowel obstruction, although this is equivocal. Mild bibasilar atelectasis. Electronically Signed   By: Julian Hy M.D.   On: 12/10/2018 09:51   Ir Abdomen US Limited  Result Date: 12/18/2018 CLINICAL DATA:  CIRRHOSIS, ASCITES EXAM: LIMITED ABDOMEN ULTRASOUND FOR ASCITES TECHNIQUE: Limited ultrasound survey for ascites was performed in all four abdominal quadrants. COMPARISON:  12/17/2018 FINDINGS: Survey of the abdominal 4 quadrants demonstrates a small amount of abdominopelvic ascites. Not enough to warrant therapeutic paracentesis. Procedure not performed. Abdomen can be reassessed if there is further distention and discomfort. IMPRESSION: Small amount of abdominopelvic ascites by ultrasound. Paracentesis not performed. See above comment. Electronically Signed   By: Jerilynn Mages.  Shick M.D.   On: 12/18/2018 15:12   Ir Paracentesis  Result Date: 12/28/2018 INDICATION: History of alcoholic cirrhosis with recurrent ascites. Request for diagnostic and therapeutic paracentesis. EXAM: ULTRASOUND GUIDED RIGHT LOWER QUADRANT PARACENTESIS MEDICATIONS: None. COMPLICATIONS: None immediate. PROCEDURE: Informed written consent was obtained from the patient after a discussion of the risks, benefits and alternatives to treatment. A timeout was performed prior to the initiation of the procedure. Initial ultrasound scanning demonstrates a large amount of ascites within the right lower abdominal quadrant. The right lower abdomen was prepped and  draped in the usual sterile fashion. 1% lidocaine with epinephrine was used for local anesthesia. Following this, a 19 gauge, 7-cm, Yueh catheter was introduced. An ultrasound image was saved for documentation purposes. The paracentesis was performed. The catheter was removed and a dressing was applied. The patient tolerated the procedure well without immediate post procedural complication. FINDINGS: A total of approximately 3.4 L of clear, amber colored fluid was removed. Samples were sent to the laboratory as requested by the clinical team. IMPRESSION: Successful ultrasound-guided paracentesis yielding 3.4 liters of peritoneal fluid. Read by: Ascencion Dike PA-C Electronically Signed   By: Corrie Mckusick D.O.   On: 12/28/2018 14:44    EKG EKG Interpretation  Date/Time:  Tuesday January 02 2019 03:16:29 EDT Ventricular Rate:  123 PR Interval:    QRS Duration: 173 QT Interval:  398 QTC Calculation: 570 R Axis:   -160 Text Interpretation:  Atrial fibrillation Consider left ventricular hypertrophy Tall T, consider metabolic/ischemic abnrm Prolonged QT interval Confirmed by Dory Horn) on 01/02/2019 3:30:52 AM   Radiology Dg Chest  2 View  Result Date: 01/01/2019 CLINICAL DATA:  Shortness of breath. Missed dialysis. EXAM: CHEST - 2 VIEW COMPARISON:  12/27/2018 FINDINGS: Unchanged cardiomegaly. Unchanged mediastinal contours. Vascular congestion with mild peribronchial thickening. No focal airspace disease or pleural effusion. No acute osseous abnormality. IMPRESSION: Cardiomegaly with vascular congestion. Mild peribronchial thickening may be pulmonary edema or bronchitis. Electronically Signed   By: Keith Rake M.D.   On: 01/01/2019 23:27    Procedures Procedures (including critical care time)  Medications Ordered in ED Medications  sodium chloride flush (NS) 0.9 % injection 3 mL (3 mLs Intravenous Not Given 01/02/19 0347)  Chlorhexidine Gluconate Cloth 2 % PADS 6 each (has no  administration in time range)  acetaminophen (TYLENOL) tablet 650 mg (has no administration in time range)  lactulose (CHRONULAC) 10 GM/15ML solution 30 g (has no administration in time range)  albuterol (PROVENTIL) (2.5 MG/3ML) 0.083% nebulizer solution 2.5 mg (has no administration in time range)  metoprolol tartrate (LOPRESSOR) tablet 25 mg (has no administration in time range)  montelukast (SINGULAIR) tablet 10 mg (has no administration in time range)  ondansetron (ZOFRAN) tablet 4 mg (has no administration in time range)  pantoprazole (PROTONIX) EC tablet 40 mg (has no administration in time range)  sevelamer carbonate (RENVELA) tablet 3,200 mg (has no administration in time range)  sorbitol 70 % solution 30 mL (has no administration in time range)  docusate sodium (COLACE) capsule 200 mg (has no administration in time range)  mometasone-formoterol (DULERA) 200-5 MCG/ACT inhaler 2 puff (has no administration in time range)  sulfamethoxazole-trimethoprim (BACTRIM DS) 800-160 MG per tablet 1 tablet (has no administration in time range)  zolpidem (AMBIEN) tablet 5 mg (has no administration in time range)  heparin injection 5,000 Units (has no administration in time range)  pantoprazole (PROTONIX) EC tablet 40 mg (has no administration in time range)  sevelamer carbonate (RENVELA) tablet 1,600 mg (has no administration in time range)  insulin aspart (novoLOG) injection 5 Units (5 Units Intravenous Given 01/02/19 0256)    And  dextrose 50 % solution 50 mL (50 mLs Intravenous Given 01/02/19 0255)  sodium bicarbonate injection 50 mEq (50 mEq Intravenous Given 01/02/19 0255)  albuterol (VENTOLIN HFA) 108 (90 Base) MCG/ACT inhaler 10 puff (10 puffs Inhalation Given 01/02/19 0256)  calcium gluconate 1 g/ 50 mL sodium chloride IVPB (0 g Intravenous Stopped 01/02/19 0350)     MDM Number of Diagnoses or Management Options Acute pulmonary edema ([place]):  Elevated troponin I level:  Hyperkalemia:    Renal failure, unspecified chronicity:  Critical Care Total time providing critical care: 75-105 minutes MDM Reviewed: previous chart, nursing note and vitals Reviewed previous: labs Interpretation: x-ray, ECG and labs (hyperkalemia by me pulmonary edema on cxr by me) Total time providing critical care: 75-105 minutes. This excludes time spent performing separately reportable procedures and services. Consults: admitting MD (Dr. Joelyn Oms nephrology)  CRITICAL CARE Performed by: Oluwaseyi [place] Dohmen-Rasch Total critical care time: 90 minutes Critical care time was exclusive of separately billable procedures and treating other patients. Critical care was necessary to treat or prevent imminent or life-threatening deterioration. Critical care was time spent personally by me on the following activities: development of treatment plan with patient and/or surrogate as well as nursing, discussions with consultants, evaluation of patient's response to treatment, examination of patient, obtaining history from patient or surrogate, ordering and performing treatments and interventions, ordering and review of laboratory studies, ordering and review of radiographic studies, pulse oximetry and re-evaluation of patient's condition.  Final Clinical Impressions(s) / ED Diagnoses   Final diagnoses:  Hyperkalemia  Acute pulmonary edema (HCC)  Renal failure, unspecified chronicity  Elevated troponin I level    Admit to medicine to dialysis    Lani Havlik, MD 01/02/19 9233

## 2019-01-02 NOTE — Progress Notes (Signed)
DISCHARGE NOTE [facility] to be discharged Home per MD order. Patient verbalized understanding.  Skin clean, dry and intact without evidence of skin break down, no evidence of skin tears noted. IV catheter discontinued intact. Site without signs and symptoms of complications. Dressing and pressure applied. Pt denies pain at the site currently. No complaints noted.  Patient free of lines, drains, and wounds.   Discharge packet assembled. An After Visit Summary (AVS) was printed and given to the patient himself. Patient escorted via wheelchair and discharged to self at the front lobby. All questions and concerns addressed.   Dolores Hoose, RN

## 2019-01-02 NOTE — H&P (Signed)
History and Physical    [name] VOH:[id number] DOB: 09-11-1960 DOA: 01/01/2019  PCP: [name], FNP  Patient coming from: Home  I have personally briefly reviewed patient's old medical records in [place]  Chief Complaint: Dyspnea, dialysis non-compliance  HPI: [name] is a 58 y.o. male with medical history significant of ESRD, cirrhosis due to EtOH abuse + HCV + HBV, CAD s/p stent 8/18, cocaine abuse.  Patient last had dialysis on 6/18 after being admitted to hospital due to volume overload and hyperkalemia from missed / partial dialysis outpt sessions (only 1 hour each on 3 prior sessions to that).  did not receive treatment on 6/19 or 6/20; patient refused.  He left the hospital on 6/20.  Patient presented to outpatient HD unit this morning, he was dissatisfied with his PCT and refused treatment, leaving before any therapy.  Developed dyspnea after that and now presents to ED.  ED Course: K 6.7, QRS wide.  Trop 0.18.  Calcium gluconate, sodium bicarbonate, insulin, dextrose in ED.  Dr. Joelyn Oms consulted.  Plans made for immediate dialysis, hospitalist asked to admit.   Review of Systems: As per HPI otherwise 10 point review of systems negative.   Past Medical History:  Diagnosis Date  . Anemia   . Anxiety   . Arthritis    left shoulder  . Atherosclerosis of aorta ([place])   . Cardiomegaly   . Chest pain    DATE UNKNOWN, C/O PERIODICALLY  . Cocaine abuse ([place])   . COPD exacerbation ([place]) 08/17/2016  . Coronary artery disease    stent 02/22/17  . ESRD (end stage renal disease) on dialysis ([place])    "[name]; MWF" (07/04/2017)  . GERD (gastroesophageal reflux disease)    DATE UNKNOWN  . Hemorrhoids   . Hepatitis B, chronic ([place])   . Hepatitis C   . History of kidney stones   . Hyperkalemia   . Hypertension   . Kidney failure   . Metabolic bone disease    Patient denies  . Mitral stenosis   . Myocardial infarction ([place])    . Pneumonia   . Pulmonary edema   . Solitary rectal ulcer syndrome 07/2017   at flex sig for rectal bleeding  . Tubular adenoma of colon     Past Surgical History:  Procedure Laterality Date  . A/V FISTULAGRAM Left 05/26/2017   Procedure: A/V FISTULAGRAM;  Surgeon: Conrad Oxford, MD;  Location: [place] [facility];  Service: Cardiovascular;  Laterality: Left;  . A/V FISTULAGRAM Right 11/18/2017   Procedure: A/V FISTULAGRAM - Right Arm;  Surgeon: Elam Dutch, MD;  Location: [place] [facility];  Service: Cardiovascular;  Laterality: Right;  . APPLICATION OF WOUND VAC Left 06/14/2017   Procedure: APPLICATION OF WOUND VAC;  Surgeon: Katha Cabal, MD;  Location: [facility];  Service: Vascular;  Laterality: Left;  . AV FISTULA PLACEMENT  2012   BELIEVED WAS PLACED IN JUNE  . AV FISTULA PLACEMENT Right 08/09/2017   Procedure: Creation Right arm ARTERIOVENOUS BRACHIOCEPOHALIC FISTULA;  Surgeon: Elam Dutch, MD;  [facility] OR;  Service: Vascular;  Laterality: Right;  . AV FISTULA PLACEMENT Right 11/22/2017   Procedure: INSERTION OF ARTERIOVENOUS (AV) GORE-TEX GRAFT RIGHT UPPER ARM;  Surgeon: Elam Dutch, MD;  Location: [place];  Service: Vascular;  Laterality: Right;  . BIOPSY  01/25/2018   Procedure: BIOPSY;  Surgeon: Jerene Bears, MD;  Location: [facility] ENDOSCOPY;  Service: Gastroenterology;;  .  COLONOSCOPY    . COLONOSCOPY WITH PROPOFOL N/A 01/25/2018   Procedure: COLONOSCOPY WITH PROPOFOL;  Surgeon: Jerene Bears, MD;  Location: [place];  Service: Gastroenterology;  Laterality: N/A;  . CORONARY STENT INTERVENTION N/A 02/22/2017   Procedure: CORONARY STENT INTERVENTION;  Surgeon: Nigel Mormon, MD;  Location: [place] [facility];  Service: Cardiovascular;  Laterality: N/A;  . ESOPHAGOGASTRODUODENOSCOPY (EGD) WITH PROPOFOL N/A 01/25/2018   Procedure: ESOPHAGOGASTRODUODENOSCOPY (EGD) WITH PROPOFOL;  Surgeon: Jerene Bears, MD;  Location: [place];  Service:  Gastroenterology;  Laterality: N/A;  . FLEXIBLE SIGMOIDOSCOPY N/A 07/15/2017   Procedure: FLEXIBLE SIGMOIDOSCOPY;  Surgeon: Carol Ada, MD;  Location: [place];  Service: Endoscopy;  Laterality: N/A;  . HEMORRHOID BANDING    . I&D EXTREMITY Left 06/01/2017   Procedure: IRRIGATION AND DEBRIDEMENT LEFT ARM HEMATOMA WITH LIGATION OF LEFT ARM AV FISTULA;  Surgeon: Elam Dutch, MD;  Location: [place];  Service: Vascular;  Laterality: Left;  . I&D EXTREMITY Left 06/14/2017   Procedure: IRRIGATION AND DEBRIDEMENT EXTREMITY;  Surgeon: Katha Cabal, MD;  Location: [facility] ORS;  Service: Vascular;  Laterality: Left;  . INSERTION OF DIALYSIS CATHETER  05/30/2017  . INSERTION OF DIALYSIS CATHETER N/A 05/30/2017   Procedure: INSERTION OF DIALYSIS CATHETER;  Surgeon: Elam Dutch, MD;  Location: [place];  Service: Vascular;  Laterality: N/A;  . IR PARACENTESIS  08/30/2017  . IR PARACENTESIS  09/29/2017  . IR PARACENTESIS  10/28/2017  . IR PARACENTESIS  11/09/2017  . IR PARACENTESIS  11/16/2017  . IR PARACENTESIS  11/28/2017  . IR PARACENTESIS  12/01/2017  . IR PARACENTESIS  12/06/2017  . IR PARACENTESIS  01/03/2018  . IR PARACENTESIS  01/23/2018  . IR PARACENTESIS  02/07/2018  . IR PARACENTESIS  02/21/2018  . IR PARACENTESIS  03/06/2018  . IR PARACENTESIS  03/17/2018  . IR PARACENTESIS  04/04/2018  . IR PARACENTESIS  12/28/2018  . IR RADIOLOGIST EVAL & MGMT  02/14/2018  . LEFT HEART CATH AND CORONARY ANGIOGRAPHY N/A 02/22/2017   Procedure: LEFT HEART CATH AND CORONARY ANGIOGRAPHY;  Surgeon: Nigel Mormon, MD;  Location: [place] [facility];  Service: Cardiovascular;  Laterality: N/A;  . LIGATION OF ARTERIOVENOUS  FISTULA Left 0/08/7251   Procedure: Plication of Left Arm Arteriovenous Fistula;  Surgeon: Elam Dutch, MD;  Location: [place];  Service: Vascular;  Laterality: Left;  . POLYPECTOMY    . POLYPECTOMY  01/25/2018   Procedure: POLYPECTOMY;  Surgeon: Jerene Bears, MD;  Location: [place];  Service: Gastroenterology;;  . REVISON OF ARTERIOVENOUS FISTULA Left 6/64/4034   Procedure: PLICATION OF DISTAL ANEURYSMAL SEGEMENT OF LEFT UPPER ARM ARTERIOVENOUS FISTULA;  Surgeon: Elam Dutch, MD;  Location: [place];  Service: Vascular;  Laterality: Left;  . REVISON OF ARTERIOVENOUS FISTULA Left 7/42/5956   Procedure: Plication of Left Upper Arm Fistula ;  Surgeon: Waynetta Sandy, MD;  Location: [place];  Service: Vascular;  Laterality: Left;  . SKIN GRAFT SPLIT THICKNESS LEG / FOOT Left    SKIN GRAFT SPLIT THICKNESS LEFT ARM DONOR SITE: LEFT ANTERIOR THIGH  . SKIN SPLIT GRAFT Left 07/04/2017   Procedure: SKIN GRAFT SPLIT THICKNESS LEFT ARM DONOR SITE: LEFT ANTERIOR THIGH;  Surgeon: Elam Dutch, MD;  Location: [place];  Service: Vascular;  Laterality: Left;  . THROMBECTOMY W/ EMBOLECTOMY Left 06/05/2017   Procedure: EXPLORATION OF LEFT ARM FOR BLEEDING; OVERSEWED PROXIMAL FISTULA;  Surgeon: Angelia Mould, MD;  Location: [place];  Service: Vascular;  Laterality: Left;  . WOUND EXPLORATION Left 06/03/2017   Procedure: WOUND EXPLORATION WITH WOUND VAC APPLICATION TO LEFT ARM;  Surgeon: Angelia Mould, MD;  Location: [place];  Service: Vascular;  Laterality: Left;     reports that he has been smoking cigarettes. He started smoking about 45 years ago. He has a 21.50 pack-year smoking history. He has never used smokeless tobacco. He reports previous alcohol use. He reports current drug use. Drug: Marijuana.  Allergies  Allergen Reactions  . Morphine And Related Other (See Comments)    Stomach pain  . Aspirin Other (See Comments)    STOMACH PAIN  . Clonidine Derivatives Itching  . Tramadol Itching  . Tylenol [Acetaminophen] Nausea Only    Stomach ache    Family History  Problem Relation Age of Onset  . Heart disease Mother   . Lung cancer Mother   . Heart disease Father   . Malignant hyperthermia Father   . COPD Father   . Throat cancer Sister    . Esophageal cancer Sister   . Hypertension Other   . COPD Other   . Colon cancer Neg Hx   . Colon polyps Neg Hx   . Rectal cancer Neg Hx   . Stomach cancer Neg Hx      Prior to Admission medications   Medication Sig Start Date End Date Taking? Authorizing Provider  albuterol (VENTOLIN HFA) 108 (90 Base) MCG/ACT inhaler Inhale 2 puffs into the lungs every 6 (six) hours as needed for wheezing or shortness of breath.    [provider]  docusate sodium ([employer] STOOL SOFTENER) 250 MG capsule Take 250 mg by mouth daily.    [provider]  lactulose (CHRONULAC) 10 GM/15ML solution Take 45 mLs (30 g total) by mouth 2 (two) times daily. 12/26/18   Esterwood, Amy S, PA-C  metoprolol tartrate (LOPRESSOR) 25 MG tablet Take 1 tablet (25 mg total) by mouth 2 (two) times daily. 03/11/18   Geradine Girt, DO  montelukast (SINGULAIR) 10 MG tablet Take 10 mg by mouth daily.    [provider]  ondansetron (ZOFRAN) 4 MG tablet Take 4 mg by mouth every 8 (eight) hours as needed for nausea or vomiting.    [provider]  pantoprazole (PROTONIX) 40 MG tablet Take 1 tablet (40 mg total) by mouth See admin instructions. Take one tablet (40 mg) by mouth in the morning on Sunday, Tuesday, Thursday, Saturday, take one tablet (40 mg) after dialysis on Monday, Wednesday, Friday. May also take one tablet (40 mg) in the evening as needed for acid reflux. 12/26/18   Esterwood, Amy S, PA-C  sevelamer carbonate (RENVELA) 800 MG tablet Take 4 tablets with meals  And 2 tablets twice daily with snacks Patient not taking: Reported on 12/28/2018 01/22/18   Patrecia Pour, MD  sorbitol 70 % SOLN Take 30 mLs by mouth 3 (three) times daily as needed for constipation. 12/07/18   [provider]  sulfamethoxazole-trimethoprim (BACTRIM DS) 800-160 MG tablet Take 1 tablet by mouth daily. 12/30/18   Geradine Girt, DO  SYMBICORT 160-4.5 MCG/ACT inhaler Inhale 2 puffs into the lungs 2 (two) times a  day. 11/30/18   [provider]  zolpidem (AMBIEN) 5 MG tablet Take 5 mg by mouth at bedtime. 12/08/18   [provider]    Physical Exam: Vitals:   01/01/19 2215 01/02/19 0128 01/02/19 0300 01/02/19 0345  BP: (!) 157/144 132/85 (!) 129/96 (!) 126/91  Pulse: 77 92    Resp:  16 20 (!) 22  Temp: 98 F (36.7 C) 98 F (36.7 C)    TempSrc: Oral Oral    SpO2:  100%      Constitutional: NAD, calm, comfortable Eyes: PERRL, lids and conjunctivae normal ENMT: Mucous membranes are moist. Posterior pharynx clear of any exudate or lesions.Normal dentition.  Neck: normal, supple, no masses, no thyromegaly Respiratory: clear to auscultation bilaterally, no wheezing, no crackles. Normal respiratory effort. No accessory muscle use.  Cardiovascular: Tachycardic, irr, irr Abdomen: no tenderness, no masses palpated. No hepatosplenomegaly. Bowel sounds positive.  Musculoskeletal: no clubbing / cyanosis. No joint deformity upper and lower extremities. Good ROM, no contractures. Normal muscle tone.  Skin: no rashes, lesions, ulcers. No induration Neurologic: CN 2-12 grossly intact. Sensation intact, DTR normal. Strength 5/5 in all 4.  Psychiatric: Normal judgment and insight. Alert and oriented x 3. Normal mood.    Labs on Admission: I have personally reviewed following labs and imaging studies  CBC: Recent Labs  Lab 12/27/18 2134 12/28/18 0321 01/02/19 0046  WBC 7.6 6.9 9.7  HGB 11.3* 11.0* 11.0*  HCT 32.4* 31.5* 30.4*  MCV 80.4 79.7* 78.6*  PLT 114* 111* 474*   Basic Metabolic Panel: Recent Labs  Lab 12/27/18 2134 12/28/18 0321 12/28/18 1235 12/30/18 1150 01/02/19 0046  NA 136 135  --  132* 131*  K 6.0* 6.0* 4.2 5.5* 6.7*  CL 97* 95*  --  96* 94*  CO2 22 21*  --  23 17*  GLUCOSE 83 74  --  99 108*  BUN 35* 36*  --  38* 63*  CREATININE 8.18* 8.49*  --  8.40* 11.54*  CALCIUM 9.8 9.6  --  9.7 10.9*  MG  --  2.1  --   --   --    GFR: Estimated Creatinine  Clearance: 6.7 mL/min (A) (by C-G formula based on SCr of 11.54 mg/dL (H)). Liver Function Tests: No results for input(s): AST, ALT, ALKPHOS, BILITOT, PROT, ALBUMIN in the last 168 hours. No results for input(s): LIPASE, AMYLASE in the last 168 hours. No results for input(s): AMMONIA in the last 168 hours. Coagulation Profile: Recent Labs  Lab 12/26/18 1110  INR 1.2*   Cardiac Enzymes: Recent Labs  Lab 12/28/18 0304 12/28/18 0321 12/28/18 1235 12/28/18 1838 01/02/19 0046  TROPONINI 0.07* 0.07* 0.07* 0.07* 0.18*   BNP (last 3 results) No results for input(s): PROBNP in the last 8760 hours. HbA1C: No results for input(s): HGBA1C in the last 72 hours. CBG: Recent Labs  Lab 12/28/18 0413 01/02/19 0252  GLUCAP 75 110*   Lipid Profile: No results for input(s): CHOL, HDL, LDLCALC, TRIG, CHOLHDL, LDLDIRECT in the last 72 hours. Thyroid Function Tests: No results for input(s): TSH, T4TOTAL, FREET4, T3FREE, THYROIDAB in the last 72 hours. Anemia Panel: No results for input(s): VITAMINB12, FOLATE, FERRITIN, TIBC, IRON, RETICCTPCT in the last 72 hours. Urine analysis:    Component Value Date/Time   COLORURINE YELLOW 07/16/2011 1619   APPEARANCEUR CLEAR 07/16/2011 1619   LABSPEC 1.018 07/16/2011 1619   PHURINE 7.5 07/16/2011 1619   GLUCOSEU 250 (A) 07/16/2011 1619   HGBUR MODERATE (A) 07/16/2011 1619   BILIRUBINUR SMALL (A) 07/16/2011 1619   KETONESUR NEGATIVE 07/16/2011 1619   PROTEINUR >300 (A) 07/16/2011 1619   UROBILINOGEN 1.0 07/16/2011 1619   NITRITE NEGATIVE 07/16/2011 1619   LEUKOCYTESUR SMALL (A) 07/16/2011 1619    Radiological Exams on Admission: Dg Chest 2 View  Result  Date: 01/01/2019 CLINICAL DATA:  Shortness of breath. Missed dialysis. EXAM: CHEST - 2 VIEW COMPARISON:  12/27/2018 FINDINGS: Unchanged cardiomegaly. Unchanged mediastinal contours. Vascular congestion with mild peribronchial thickening. No focal airspace disease or pleural effusion. No acute  osseous abnormality. IMPRESSION: Cardiomegaly with vascular congestion. Mild peribronchial thickening may be pulmonary edema or bronchitis. Electronically Signed   By: Keith Rake M.D.   On: 01/01/2019 23:27    EKG: Independently reviewed.  Assessment/Plan Principal Problem:   Hyperkalemia Active Problems:   Atrial flutter with rapid ventricular response (HCC)   ESRD on dialysis ([place])   Liver cirrhosis ([place])    1. Hyperkalemia, missed dialysis - also mild fluid overload too 1. Dialysis now per Dr. Joelyn Oms 2. Repeat BMP in AM 3. Convert to IP if not discharged later today: 1. Suspect he will NEED to stay (medically) for further dialysis sessions, if he lets [place] keep him... 2. A.Fib RVR - 1. Dialysis as above to correct potassium, acidosis 2. Resume home metoprolol with dose now 3. Not on chronic anticoagulation due to compliance concerns per prior notes 4. Serial trops 5. Tele monitor 3. Cirrhosis - 1. Continue lactulose 2. Patient got put on chronic daily bactrim per GI recs (see office note of 6/18), continuing this 3. Cont protonix  DVT prophylaxis: Heparin [place] Code Status: Full Family Communication: No family in room Disposition Plan: Home after admit Consults called: Dr. Joelyn Oms Admission status: Place in obs, convert to IP if not discharged later today    GARDNER, JARED M. DO [facility]  How to contact the [facility] Attending or Consulting provider [place] or covering provider during after hours [place], for this patient?  1. Check the care team in [facility] and look for a) attending/consulting [facility] provider listed and b) the [facility] team listed 2. Log into [email or web address]  Amion Physician Scheduling and messaging for groups and whole hospitals  On call and physician scheduling software for group practices, residents, hospitalists and other medical providers for call, clinic, rotation and shift schedules. On[employer] is a hospital-wide system for scheduling doctors and  paging doctors on call. EasyPlot is for scientific plotting and data analysis.  [email or web address]  and use [place]'s universal password to access. If you do not have the password, please contact the hospital operator.  3. Locate the [facility] provider you are looking for under [facility] and page to a number that you can be directly reached. 4. If you still have difficulty reaching the provider, please page the [facility] (Director on Call) for the Hospitalists listed on amion for assistance.  01/02/2019, 4:11 AM

## 2019-01-02 NOTE — Progress Notes (Signed)
DISCHARGE NOTE HOME [name] to be discharged Home per MD order. Discussed prescriptions and follow up appointments with the patient. Prescriptions given to patient; medication list explained in detail. Patient verbalized understanding.  Skin clean, dry and intact without evidence of skin break down, no evidence of skin tears noted. IV catheter discontinued intact. Site without signs and symptoms of complications. Dressing and pressure applied. Pt denies pain at the site currently. No complaints noted.  Patient free of lines, drains, and wounds.   An After Visit Summary (AVS) was printed and given to the patient. Patient escorted via wheelchair, and discharged home via private auto.  Dorthey Sawyer, RN

## 2019-01-03 DIAGNOSIS — Z23 Encounter for immunization: Secondary | ICD-10-CM | POA: Diagnosis not present

## 2019-01-03 DIAGNOSIS — D509 Iron deficiency anemia, unspecified: Secondary | ICD-10-CM | POA: Diagnosis not present

## 2019-01-03 DIAGNOSIS — N186 End stage renal disease: Secondary | ICD-10-CM | POA: Diagnosis not present

## 2019-01-03 DIAGNOSIS — N2581 Secondary hyperparathyroidism of renal origin: Secondary | ICD-10-CM | POA: Diagnosis not present

## 2019-01-03 DIAGNOSIS — D631 Anemia in chronic kidney disease: Secondary | ICD-10-CM | POA: Diagnosis not present

## 2019-01-03 NOTE — Discharge Summary (Addendum)
[facility] Discharge Summary   Patient: [name] NTI:[id number]   PCP: [name], FNP DOB: [birth year]   Date of admission: 01/01/2019   Date of discharge: 01/02/2019     Discharge Diagnoses:   Principal Problem:   Hyperkalemia Active Problems:   Atrial flutter with rapid ventricular response ([place])   ESRD on dialysis ([place])   Liver cirrhosis ([place])   Admitted From: home Disposition:  Home   Recommendations for Outpatient Follow-up:  1. Please follow up with PCP in 1 week   Follow-up Information    [name], FNP. Schedule an appointment as soon as possible for a visit in 1 week(s).   Specialty: Family Medicine Contact information: [place] [address] [phone]          Diet recommendation: renal diet  Activity: The patient is advised to gradually reintroduce usual activities,as tolerated .  Discharge Condition: good  Code Status: DNR  History of present illness: As per the H and P dictated on admission, "[name] is a 58 y.o. male with medical history significant of ESRD, cirrhosis due to EtOH abuse + HCV + HBV, CAD s/p stent 8/18, cocaine abuse.  Patient last had dialysis on 6/18 after being admitted to hospital due to volume overload and hyperkalemia from missed / partial dialysis outpt sessions (only 1 hour each on 3 prior sessions to that).  did not receive treatment on 6/19 or 6/20; patient refused. He left the hospital on 6/20.  Patient presented to outpatient HD unit this morning, he was dissatisfied with his PCT and refused treatment, leaving before any therapy.  Developed dyspnea after that and now presents to ED."  Hospital Course:  Summary of his active problems in the hospital is as following. 1. Hyperkalemia, missed dialysis - also mild fluid overload too 1. Dialysis now per Dr. Joelyn Oms 2. D/w nephrology pt does not need further HD and will go for his routine HD tomorrow. Potassium  level not need to be rechecked as well.   2. A.Fib RVR - 1. Dialysis as above to correct potassium, acidosis 2. Resume home metoprolol with dose now 3. Not on chronic anticoagulation due to compliance concerns per prior notes 4. Serial trops negative 3. Cirrhosis - 1. Continue lactulose 2. Patient got put on chronic daily bactrim per GI recs (see office note of 6/18), continuing this 3. Cont protonix  Patient was ambulatory without any assistance. On the day of the discharge the patient's vitals were stable, and no other acute medical condition were reported by patient. the patient was felt safe to be discharge at Home with family.  Consultants: nephrology Procedures: emergent HD  DISCHARGE MEDICATION: Allergies as of 01/02/2019      Reactions   Morphine And Related Other (See Comments)   Stomach pain   Aspirin Other (See Comments)   STOMACH PAIN   Clonidine Derivatives Itching   Tramadol Itching   Tylenol [acetaminophen] Nausea Only   Stomach ache      Medication List    TAKE these medications   [employer] Stool Softener 250 MG capsule Generic drug: docusate sodium Take 250 mg by mouth daily.   lactulose 10 GM/15ML solution Commonly known as: CHRONULAC Take 45 mLs (30 g total) by mouth 2 (two) times daily.   metoprolol tartrate 25 MG tablet Commonly known as: LOPRESSOR Take 1 tablet (25 mg total) by mouth 2 (two) times daily.   montelukast 10 MG tablet Commonly known as: SINGULAIR  Take 10 mg by mouth daily.   ondansetron 4 MG tablet Commonly known as: ZOFRAN Take 4 mg by mouth every 8 (eight) hours as needed for nausea or vomiting.   pantoprazole 40 MG tablet Commonly known as: PROTONIX Take 1 tablet (40 mg total) by mouth See admin instructions. Take one tablet (40 mg) by mouth in the morning on Sunday, Tuesday, Thursday, Saturday, take one tablet (40 mg) after dialysis on Monday, Wednesday, Friday. May also take one tablet (40 mg) in the evening as needed for acid  reflux.   sevelamer carbonate 800 MG tablet Commonly known as: RENVELA Take 4 tablets with meals  And 2 tablets twice daily with snacks   sorbitol 70 % Soln Take 30 mLs by mouth 3 (three) times daily as needed for constipation.   sulfamethoxazole-trimethoprim 800-160 MG tablet Commonly known as: BACTRIM DS Take 1 tablet by mouth daily.   Symbicort 160-4.5 MCG/ACT inhaler Generic drug: budesonide-formoterol Inhale 2 puffs into the lungs 2 (two) times a day.   Ventolin HFA 108 (90 Base) MCG/ACT inhaler Generic drug: albuterol Inhale 2 puffs into the lungs every 6 (six) hours as needed for wheezing or shortness of breath.   zolpidem 5 MG tablet Commonly known as: AMBIEN Take 5 mg by mouth at bedtime.      Allergies  Allergen Reactions   Morphine And Related Other (See Comments)    Stomach pain   Aspirin Other (See Comments)    STOMACH PAIN   Clonidine Derivatives Itching   Tramadol Itching   Tylenol [Acetaminophen] Nausea Only    Stomach ache   Discharge Instructions    Diet renal [id number]   Complete by: As directed    Discharge instructions   Complete by: As directed    It is important that you read the given instructions as well as go over your medication list with RN to help you understand your care after this hospitalization.  Discharge Instructions: Please follow-up with PCP in 1-2 weeks  Please request your primary care physician to go over all Hospital Tests and Procedure/Radiological results at the follow up. Please get all Hospital records sent to your PCP by signing hospital release before you go home.   Do not take more than prescribed Pain, Sleep and Anxiety Medications. You were cared for by a hospitalist during your hospital stay. If you have any questions about your discharge medications or the care you received while you were in the hospital after you are discharged, you can call the unit @UNIT @ you were admitted to and ask to speak with the  hospitalist on call if the hospitalist that took care of you is not available.  Once you are discharged, your primary care physician will handle any further medical issues. Please note that NO REFILLS for any discharge medications will be authorized once you are discharged, as it is imperative that you return to your primary care physician (or establish a relationship with a primary care physician if you do not have one) for your aftercare needs so that they can reassess your need for medications and monitor your lab values. You Must read complete instructions/literature along with all the possible adverse reactions/side effects for all the Medicines you take and that have been prescribed to you. Take any new Medicines after you have completely understood and accept all the possible adverse reactions/side effects. Wear Seat belts while driving. If you have smoked or chewed Tobacco in the last 2 yrs please stop smoking and/or stop any  Recreational drug use.  If you drink alcohol, please minimize the use and do not drive, operating heavy machinery, perform activities at heights, swimming or participation in water activities or provide baby sitting services under influence.   Increase activity slowly   Complete by: As directed      Discharge Exam: Filed Weights   01/02/19 0921  Weight: 67.3 kg   Vitals:   01/02/19 0830 01/02/19 0921  BP: (!) 157/86 (!) 141/76  Pulse: (!) 105 100  Resp:  (!) 29  Temp:  98 F (36.7 C)  SpO2:  100%   General: Appear in no distress, no Rash; Oral Mucosa Clear, moist. no Abnormal Mass Or lumps Cardiovascular: S1 and S2 Present, aortic systolic Murmur, Respiratory: normal respiratory effort, Bilateral Air entry present and Clear to Auscultation, no Crackles, no wheezes Abdomen: Bowel Sound present, Soft and no tenderness, no hernia Extremities: no Pedal edema, no calf tenderness Neurology: alert and oriented to time, place, and person affect appropriate. normal  without focal findings, mental status, speech normal, alert and oriented x3, PERLA, Motor strength 5/5 and symmetric and sensation grossly normal to light touch   The results of significant diagnostics from this hospitalization (including imaging, microbiology, ancillary and laboratory) are listed below for reference.    Significant Diagnostic Studies: Dg Chest 2 View  Result Date: 01/01/2019 CLINICAL DATA:  Shortness of breath. Missed dialysis. EXAM: CHEST - 2 VIEW COMPARISON:  12/27/2018 FINDINGS: Unchanged cardiomegaly. Unchanged mediastinal contours. Vascular congestion with mild peribronchial thickening. No focal airspace disease or pleural effusion. No acute osseous abnormality. IMPRESSION: Cardiomegaly with vascular congestion. Mild peribronchial thickening may be pulmonary edema or bronchitis. Electronically Signed   By: Keith Rake M.D.   On: 01/01/2019 23:27   Dg Chest 2 View  Result Date: 12/27/2018 CLINICAL DATA:  Shortness of breath EXAM: CHEST - 2 VIEW COMPARISON:  12/10/2018, 09/21/2018, 03/10/2018 FINDINGS: Mild elevation of left diaphragm. No pleural effusion. Cardiomegaly with vascular congestion. Aortic atherosclerosis. No pneumothorax. IMPRESSION: Cardiomegaly with vascular congestion. Electronically Signed   By: Donavan Foil M.D.   On: 12/27/2018 21:50   Ct Abdomen Pelvis W Contrast  Result Date: 12/17/2018 CLINICAL DATA:  Abdominal pain and diarrhea.  Chronic renal failure EXAM: CT ABDOMEN AND PELVIS WITH CONTRAST TECHNIQUE: Multidetector CT imaging of the abdomen and pelvis was performed using the standard protocol following bolus administration of intravenous contrast. CONTRAST:  138mL OMNIPAQUE IOHEXOL 300 MG/ML  SOLN COMPARISON:  August 03, 2018 FINDINGS: Lower chest: There is atelectatic change in the left base. Visualized lung bases otherwise are clear. There is cardiomegaly. There is a degree of left ventricular hypertrophy. There is calcification in the mitral  valve. Hepatobiliary: The liver has a subtly nodular contour, consistent with cirrhosis. There is generalized decreased in liver attenuation, potentially with underlying hepatic steatosis. No focal liver lesions are evident. The portal and hepatic veins appear patent. Gallbladder is contracted there is no appreciable biliary duct dilatation. Pancreas: There is no pancreatic mass or inflammatory focus. Spleen: The spleen is normal in size and contour. No splenic lesions are evident. Adrenals/Urinary Tract: Adrenals bilaterally appear unremarkable. Native kidneys are atrophic bilaterally. There is extensive vascular calcification in both main renal arteries as well as in peripheral renal arterial branches. There is an approximately 1 x 1 cm cyst arising from the medial upper pole left kidney. There is no hydronephrosis on either side. There is no evident renal or ureteral calculus on either side. Urinary bladder is virtually empty. There is  no appreciable urinary bladder wall thickening given virtually empty state. Stomach/Bowel: There is no appreciable bowel wall or mesenteric thickening. A clip is noted in the rectum. There is no evident bowel obstruction. There is no free air or portal venous air. Vascular/Lymphatic: There is extensive aortic and mesenteric arterial vascular calcification. There is extensive generalized pelvic arterial vascular calcification. Penile artery calcification bilaterally noted. No adenopathy is evident in the abdomen or pelvis. Reproductive: Prostate and seminal vesicles appear normal in size and contour. There is mild prostatic calcification. Other: There is marked ascites throughout the abdomen and pelvis. No abscess evident in the abdomen or pelvis. There is no periappendiceal region inflammatory change. Musculoskeletal: Bones are diffusely sclerotic, consistent with secondary hyperparathyroidism, a consequence of chronic renal failure. No destructive bony changes evident. There is no  evident intramuscular lesion. No abdominal wall lesions evident. IMPRESSION: 1.  Evidence of hepatic cirrhosis.  No focal liver lesions evident. 2.  Extensive ascites throughout the abdomen and pelvis. 3. No evident bowel obstruction. No evident abscess in the abdomen or pelvis. No inflammatory foci evident. 4. Atrophic kidneys without hydronephrosis. No renal or ureteral calculi. 5.  Marked diffuse arterial vascular calcification. 6. Diffuse bony sclerosis, a consequence of chronic secondary hyperparathyroidism. Electronically Signed   By: Lowella Grip III M.D.   On: 12/17/2018 13:58   US Paracentesis  Result Date: 12/14/2018 INDICATION: Patient with history of alcoholic cirrhosis with recurrent ascites. Request is made for diagnostic and therapeutic paracentesis. EXAM: ULTRASOUND GUIDED DIAGNOSTIC AND THERAPEUTIC PARACENTESIS MEDICATIONS: 10 mL 1% lidocaine COMPLICATIONS: None immediate. PROCEDURE: Informed written consent was obtained from the patient after a discussion of the risks, benefits and alternatives to treatment. A timeout was performed prior to the initiation of the procedure. Initial ultrasound scanning demonstrates a large amount of ascites within the left lower abdominal quadrant. The left lower abdomen was prepped and draped in the usual sterile fashion. 1% lidocaine was used for local anesthesia. Following this, a 19 gauge, 7-cm, Yueh catheter was introduced. An ultrasound image was saved for documentation purposes. The paracentesis was performed. The catheter was removed and a dressing was applied. The patient tolerated the procedure well without immediate post procedural complication. FINDINGS: A total of approximately 2.45 L of hazy amber fluid was removed. Samples were sent to the laboratory as requested by the clinical team. IMPRESSION: Successful ultrasound-guided paracentesis yielding 2.45 L of peritoneal fluid. Read by: Earley Abide, PA-C Electronically Signed   By: Jerilynn Mages.  Shick  M.D.   On: 12/13/2018 17:04   Dg Abd Acute W/chest  Result Date: 12/10/2018 CLINICAL DATA:  Constipation x3 days EXAM: DG ABDOMEN ACUTE W/ 1V CHEST COMPARISON:  Chest radiographs dated 09/21/2018. CT abdomen/pelvis dated 08/03/2018. FINDINGS: Lungs are essentially clear. Mild bibasilar atelectasis. No pleural effusion or pneumothorax. Cardiomegaly.  Thoracic aortic atherosclerosis. Mildly dilated loops of small bowel in the central abdomen, raising the possibility of partial small bowel obstruction. However, the splenic flexure is visualized and appears normal (not decompressed). Paucity of bowel loops in the periphery suggest ascites, which may also exacerbate this appearance. Normal colonic stool burden. No evidence of free air under the diaphragm on the upright view. Vascular calcifications. Visualized osseous structures are within normal limits. IMPRESSION: Normal colonic stool burden. Paucity of bowel loops in the periphery of the abdomen suggests ascites. Mildly dilated loops of small bowel in the central abdomen at least raises the possibility of partial small bowel obstruction, although this is equivocal. Mild bibasilar atelectasis. Electronically Signed  By: Julian Hy M.D.   On: 12/10/2018 09:51   Ir Abdomen US Limited  Result Date: 12/18/2018 CLINICAL DATA:  CIRRHOSIS, ASCITES EXAM: LIMITED ABDOMEN ULTRASOUND FOR ASCITES TECHNIQUE: Limited ultrasound survey for ascites was performed in all four abdominal quadrants. COMPARISON:  12/17/2018 FINDINGS: Survey of the abdominal 4 quadrants demonstrates a small amount of abdominopelvic ascites. Not enough to warrant therapeutic paracentesis. Procedure not performed. Abdomen can be reassessed if there is further distention and discomfort. IMPRESSION: Small amount of abdominopelvic ascites by ultrasound. Paracentesis not performed. See above comment. Electronically Signed   By: Jerilynn Mages.  Shick M.D.   On: 12/18/2018 15:12   Ir Paracentesis  Result  Date: 12/28/2018 INDICATION: History of alcoholic cirrhosis with recurrent ascites. Request for diagnostic and therapeutic paracentesis. EXAM: ULTRASOUND GUIDED RIGHT LOWER QUADRANT PARACENTESIS MEDICATIONS: None. COMPLICATIONS: None immediate. PROCEDURE: Informed written consent was obtained from the patient after a discussion of the risks, benefits and alternatives to treatment. A timeout was performed prior to the initiation of the procedure. Initial ultrasound scanning demonstrates a large amount of ascites within the right lower abdominal quadrant. The right lower abdomen was prepped and draped in the usual sterile fashion. 1% lidocaine with epinephrine was used for local anesthesia. Following this, a 19 gauge, 7-cm, Yueh catheter was introduced. An ultrasound image was saved for documentation purposes. The paracentesis was performed. The catheter was removed and a dressing was applied. The patient tolerated the procedure well without immediate post procedural complication. FINDINGS: A total of approximately 3.4 L of clear, amber colored fluid was removed. Samples were sent to the laboratory as requested by the clinical team. IMPRESSION: Successful ultrasound-guided paracentesis yielding 3.4 liters of peritoneal fluid. Read by: Ascencion Dike PA-C Electronically Signed   By: Corrie Mckusick D.O.   On: 12/28/2018 14:44    Microbiology: Recent Results (from the past 240 hour(s))  SARS Coronavirus 2     Status: None   Collection Time: 12/28/18  2:50 AM  Result Value Ref Range Status   SARS Coronavirus 2 NOT DETECTED NOT DETECTED Final    Comment: (NOTE) SARS-CoV-2 target nucleic acids are NOT DETECTED. The SARS-CoV-2 RNA is generally detectable in upper and lower respiratory specimens during the acute phase of infection.  Negative  results do not preclude SARS-CoV-2 infection, do not rule out co-infections with other pathogens, and should not be used as the sole basis for treatment or other patient  management decisions.  Negative results must be combined with clinical observations, patient history, and epidemiological information. The expected result is Not Detected. Fact Sheet for Patients: http://www.biofiredefense.com/wp-content/uploads/2020/03/BIOFIRE-COVID -19-patients.pdf Fact Sheet for Healthcare Providers: http://www.biofiredefense.com/wp-content/uploads/2020/03/BIOFIRE-COVID -19-hcp.pdf This test is not yet approved or cleared by the [place] and  has been authorized for detection and/or diagnosis of SARS-CoV-2 by FDA under an Emergency Use Authorization (EUA).  This EUA will remain in effec t (meaning this test can be used) for the duration of  the COVID-19 declaration under Section 564(b)(1) of the Act, 21 U.S.C. section 360bbb-3(b)(1), unless the authorization is terminated or revoked sooner. Performed at [facility], [place] [address] [place], [place] [address]   Gram stain     Status: None   Collection Time: 12/28/18  1:57 PM   Specimen: Peritoneal Cavity; Peritoneal Fluid  Result Value Ref Range Status   Specimen Description PERITONEAL  Final   Special Requests NONE  Final   Gram Stain   Final    RARE WBC PRESENT, PREDOMINANTLY MONONUCLEAR NO ORGANISMS SEEN Performed  at [facility], [place] [address], [place], [place] [address]    Report Status 12/28/2018 FINAL  Final  Culture, body fluid-bottle     Status: None   Collection Time: 12/28/18  1:57 PM   Specimen: Peritoneal Washings  Result Value Ref Range Status   Specimen Description PERITONEAL  Final   Special Requests NONE  Final   Culture   Final    NO GROWTH 5 DAYS Performed at [facility] [address] [place], [place] [address]    Report Status 01/02/2019 FINAL  Final  SARS Coronavirus 2 (CEPHEID - Performed in [place] hospital lab), Hosp Order     Status: None   Collection Time: 01/02/19  3:34 AM   Specimen: Nasopharyngeal Swab  Result Value Ref Range Status    SARS Coronavirus 2 NEGATIVE NEGATIVE Final    Comment: (NOTE) If result is NEGATIVE SARS-CoV-2 target nucleic acids are NOT DETECTED. The SARS-CoV-2 RNA is generally detectable in upper and lower  respiratory specimens during the acute phase of infection. The lowest  concentration of SARS-CoV-2 viral copies this assay can detect is 250  copies / mL. A negative result does not preclude SARS-CoV-2 infection  and should not be used as the sole basis for treatment or other  patient management decisions.  A negative result may occur with  improper specimen collection / handling, submission of specimen other  than nasopharyngeal swab, presence of viral mutation(s) within the  areas targeted by this assay, and inadequate number of viral copies  (<250 copies / mL). A negative result must be combined with clinical  observations, patient history, and epidemiological information. If result is POSITIVE SARS-CoV-2 target nucleic acids are DETECTED. The SARS-CoV-2 RNA is generally detectable in upper and lower  respiratory specimens dur ing the acute phase of infection.  Positive  results are indicative of active infection with SARS-CoV-2.  Clinical  correlation with patient history and other diagnostic information is  necessary to determine patient infection status.  Positive results do  not rule out bacterial infection or co-infection with other viruses. If result is PRESUMPTIVE POSTIVE SARS-CoV-2 nucleic acids MAY BE PRESENT.   A presumptive positive result was obtained on the submitted specimen  and confirmed on repeat testing.  While 2019 novel coronavirus  (SARS-CoV-2) nucleic acids may be present in the submitted sample  additional confirmatory testing may be necessary for epidemiological  and / or clinical management purposes  to differentiate between  SARS-CoV-2 and other Sarbecovirus currently known to infect humans.  If clinically indicated additional testing with an alternate test    methodology [phone]) is advised. The SARS-CoV-2 RNA is generally  detectable in upper and lower respiratory sp ecimens during the acute  phase of infection. The expected result is Negative. Fact Sheet for Patients:  StrictlyIdeas.no Fact Sheet for Healthcare Providers: BankingDealers.co.za This test is not yet approved or cleared by the [place] FDA and has been authorized for detection and/or diagnosis of SARS-CoV-2 by FDA under an Emergency Use Authorization (EUA).  This EUA will remain in effect (meaning this test can be used) for the duration of the COVID-19 declaration under Section 564(b)(1) of the Act, 21 U.S.C. section 360bbb-3(b)(1), unless the authorization is terminated or revoked sooner. Performed at [facility], [place] [address], [place], [place] [address]      Labs: CBC: Recent Labs  Lab 12/27/18 2134 12/28/18 0321 01/02/19 0046  WBC 7.6 6.9 9.7  HGB 11.3* 11.0* 11.0*  HCT 32.4* 31.5* 30.4*  MCV 80.4 79.7* 78.6*  PLT 114* 111* 433*   Basic Metabolic Panel: Recent Labs  Lab 12/27/18 2134 12/28/18 0321 12/28/18 1235 12/30/18 1150 01/02/19 0046 01/02/19 0424  NA 136 135  --  132* 131* 133*  K 6.0* 6.0* 4.2 5.5* 6.7* 6.3*  CL 97* 95*  --  96* 94* 96*  CO2 22 21*  --  23 17* 18*  GLUCOSE 83 74  --  99 108* 69*  BUN 35* 36*  --  38* 63* 65*  CREATININE 8.18* 8.49*  --  8.40* 11.54* 11.94*  CALCIUM 9.8 9.6  --  9.7 10.9* 10.8*  MG  --  2.1  --   --   --   --    Liver Function Tests: No results for input(s): AST, ALT, ALKPHOS, BILITOT, PROT, ALBUMIN in the last 168 hours. No results for input(s): LIPASE, AMYLASE in the last 168 hours. No results for input(s): AMMONIA in the last 168 hours. Cardiac Enzymes: Recent Labs  Lab 12/28/18 0321 12/28/18 1235 12/28/18 1838 01/02/19 0046 01/02/19 0424  TROPONINI 0.07* 0.07* 0.07* 0.18* 0.19*   BNP (last 3 results) No results for input(s): BNP in  the last 8760 hours. CBG: Recent Labs  Lab 12/28/18 0413 01/02/19 0252  GLUCAP 75 110*   Time spent: 35 minutes  Signed:  Berle Mull  [facility] 01/02/2019

## 2019-01-05 ENCOUNTER — Encounter (HOSPITAL_COMMUNITY): Payer: Self-pay | Admitting: Emergency Medicine

## 2019-01-05 ENCOUNTER — Other Ambulatory Visit: Payer: Self-pay

## 2019-01-05 ENCOUNTER — Emergency Department (HOSPITAL_COMMUNITY)
Admission: EM | Admit: 2019-01-05 | Discharge: 2019-01-05 | Disposition: A | Discharge status: Home / Self Care | Payer: Medicare Other | Attending: Emergency Medicine | Admitting: Emergency Medicine

## 2019-01-05 DIAGNOSIS — F17210 Nicotine dependence, cigarettes, uncomplicated: Secondary | ICD-10-CM | POA: Insufficient documentation

## 2019-01-05 DIAGNOSIS — N186 End stage renal disease: Secondary | ICD-10-CM | POA: Insufficient documentation

## 2019-01-05 DIAGNOSIS — R1084 Generalized abdominal pain: Secondary | ICD-10-CM | POA: Diagnosis not present

## 2019-01-05 DIAGNOSIS — R062 Wheezing: Secondary | ICD-10-CM | POA: Diagnosis not present

## 2019-01-05 DIAGNOSIS — Z79899 Other long term (current) drug therapy: Secondary | ICD-10-CM | POA: Diagnosis not present

## 2019-01-05 DIAGNOSIS — R109 Unspecified abdominal pain: Secondary | ICD-10-CM | POA: Insufficient documentation

## 2019-01-05 DIAGNOSIS — R0602 Shortness of breath: Secondary | ICD-10-CM | POA: Diagnosis not present

## 2019-01-05 DIAGNOSIS — I120 Hypertensive chronic kidney disease with stage 5 chronic kidney disease or end stage renal disease: Secondary | ICD-10-CM | POA: Diagnosis not present

## 2019-01-05 DIAGNOSIS — J449 Chronic obstructive pulmonary disease, unspecified: Secondary | ICD-10-CM | POA: Insufficient documentation

## 2019-01-05 DIAGNOSIS — I2510 Atherosclerotic heart disease of native coronary artery without angina pectoris: Secondary | ICD-10-CM | POA: Diagnosis not present

## 2019-01-05 DIAGNOSIS — G8929 Other chronic pain: Secondary | ICD-10-CM | POA: Diagnosis not present

## 2019-01-05 DIAGNOSIS — R140 Abdominal distension (gaseous): Secondary | ICD-10-CM | POA: Diagnosis not present

## 2019-01-05 LAB — COMPREHENSIVE METABOLIC PANEL
ALT: 31 U/L (ref 0–44)
AST: 48 U/L — ABNORMAL HIGH (ref 15–41)
Albumin: 4.1 g/dL (ref 3.5–5.0)
Alkaline Phosphatase: 117 U/L (ref 38–126)
Anion gap: 14 (ref 5–15)
BUN: 35 mg/dL — ABNORMAL HIGH (ref 6–20)
CO2: 27 mmol/L (ref 22–32)
Calcium: 10.3 mg/dL (ref 8.9–10.3)
Chloride: 95 mmol/L — ABNORMAL LOW (ref 98–111)
Creatinine, Ser: 7.84 mg/dL — ABNORMAL HIGH (ref 0.61–1.24)
GFR calc Af Amer: 8 mL/min — ABNORMAL LOW (ref >60)
GFR calc non Af Amer: 7 mL/min — ABNORMAL LOW (ref >60)
Glucose, Bld: 133 mg/dL — ABNORMAL HIGH (ref 70–99)
Potassium: 4.9 mmol/L (ref 3.5–5.1)
Sodium: 136 mmol/L (ref 135–145)
Total Bilirubin: 0.8 mg/dL (ref 0.3–1.2)
Total Protein: 7.4 g/dL (ref 6.5–8.1)

## 2019-01-05 LAB — CBC
HCT: 31.1 % — ABNORMAL LOW (ref 39.0–52.0)
Hemoglobin: 10.9 g/dL — ABNORMAL LOW (ref 13.0–17.0)
MCH: 28.5 pg (ref 26.0–34.0)
MCHC: 35 g/dL (ref 30.0–36.0)
MCV: 81.2 fL (ref 80.0–100.0)
Platelets: 129 10*3/uL — ABNORMAL LOW (ref 150–400)
RBC: 3.83 MIL/uL — ABNORMAL LOW (ref 4.22–5.81)
RDW: 16.3 % — ABNORMAL HIGH (ref 11.5–15.5)
WBC: 9.2 10*3/uL (ref 4.0–10.5)
nRBC: 0 % (ref 0.0–0.2)

## 2019-01-05 LAB — LIPASE, BLOOD: Lipase: 33 U/L (ref 11–51)

## 2019-01-05 LAB — MAGNESIUM: Magnesium: 2.1 mg/dL (ref 1.7–2.4)

## 2019-01-05 LAB — PHOSPHORUS: Phosphorus: 2.8 mg/dL (ref 2.5–4.6)

## 2019-01-05 IMAGING — US US ABDOMEN COMPLETE W/ ELASTOGRAPHY
2 series · 13 of 25 positions shown · non-contrast
Comparison: None.

CLINICAL DATA: Hepatitis-C and hepatitis-B.

EXAM:
ULTRASOUND ABDOMEN
ULTRASOUND HEPATIC ELASTOGRAPHY
TECHNIQUE: Sonography of the upper abdomen was performed. In addition,
ultrasound elastography evaluation of the liver was performed. A
region of interest was placed within the right lobe of the liver.
Following application of a compressive sonographic pulse, shear
waves were detected in the adjacent hepatic tissue and the shear
wave velocity was calculated. Multiple assessments were performed at
the selected site. Median shear wave velocity is correlated to a
Metavir fibrosis score.

[Series 1: us abdomen complete w/ elastography · 0.19mm/px · 12 of 118 slices shown (1 of 2)]
[im 1/118]
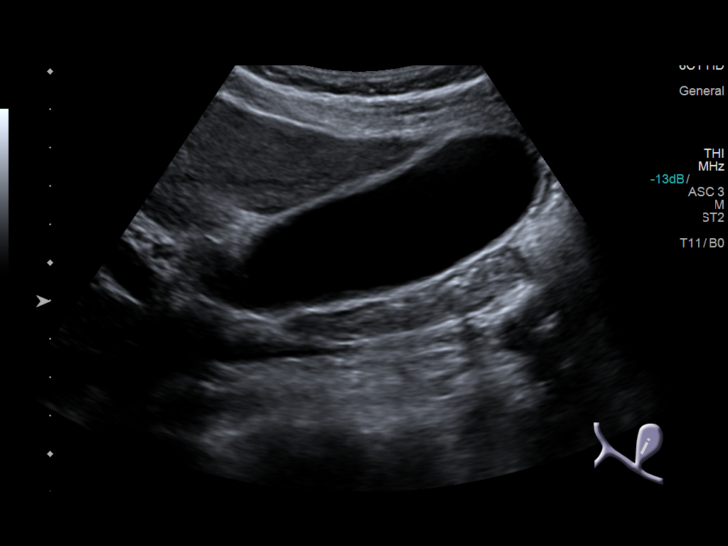
[im 11/118]
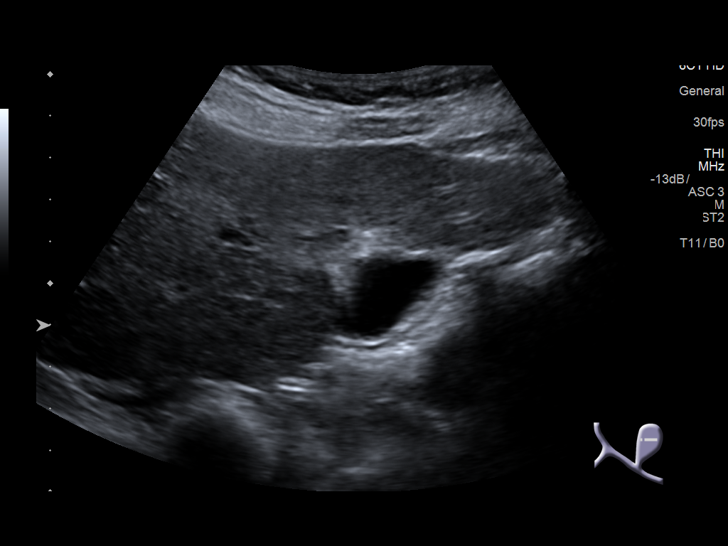
[im 22/118]
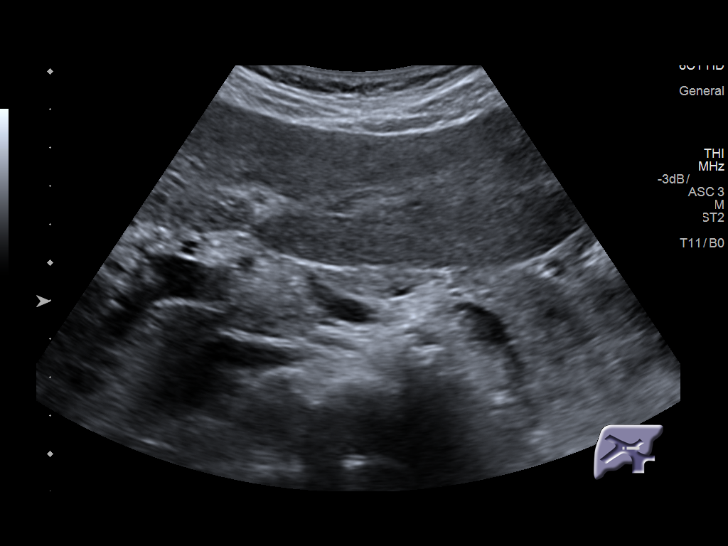
[im 32/118]
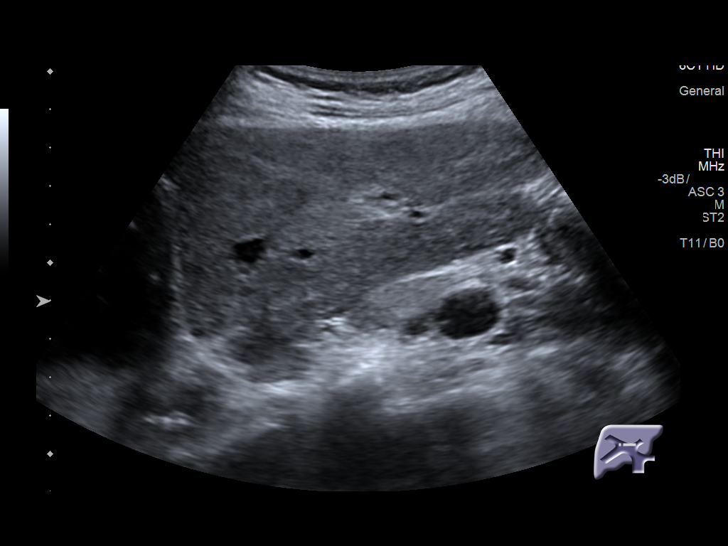
[im 43/118]
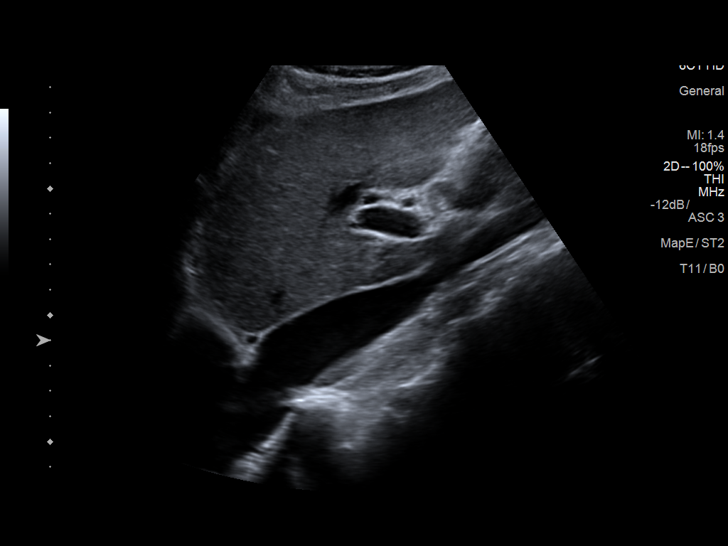
[im 54/118]
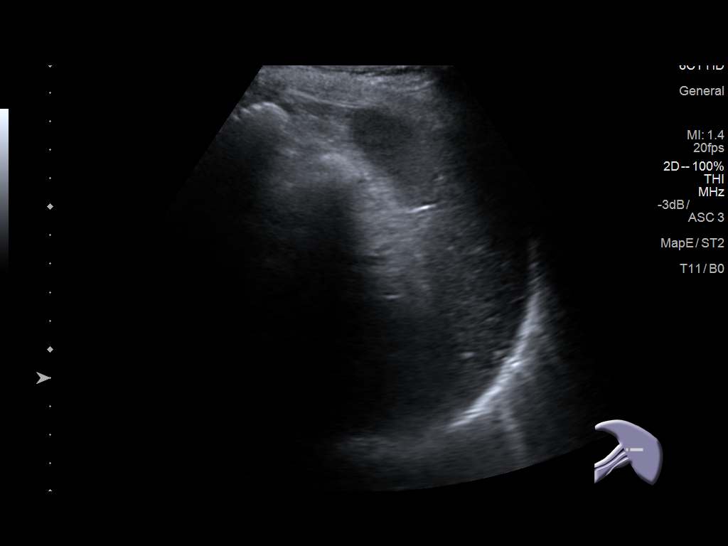
[im 64/118]
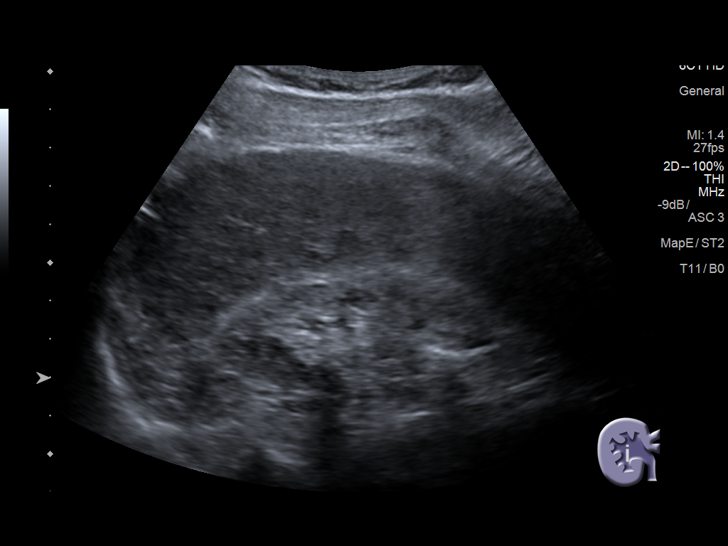
[im 75/118]
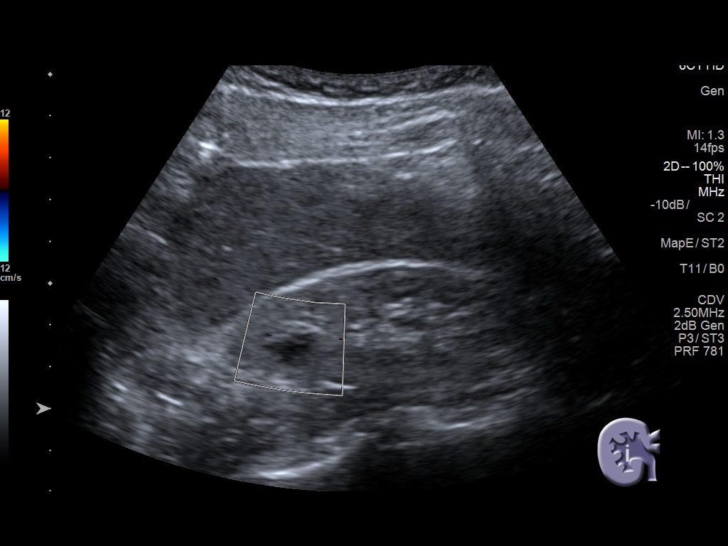
[im 86/118]
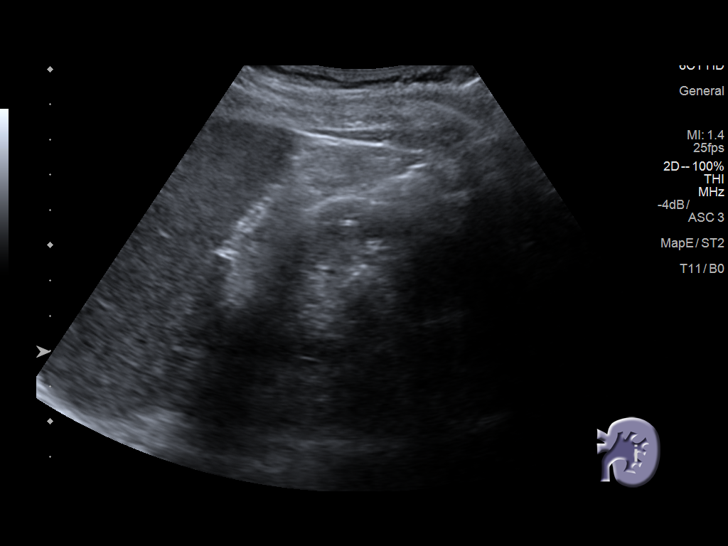
[im 96/118]
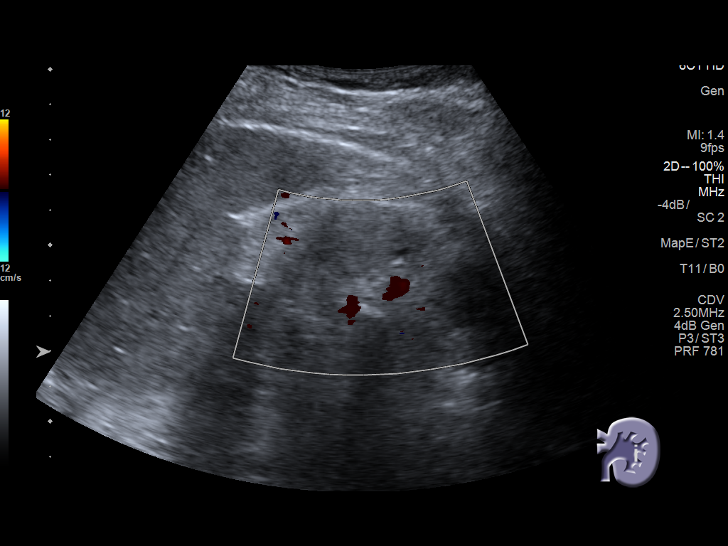
[im 107/118]
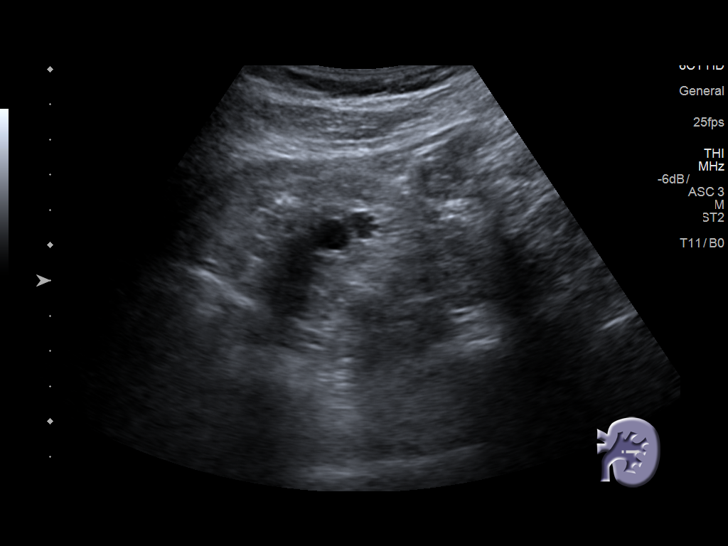
[im 118/118]
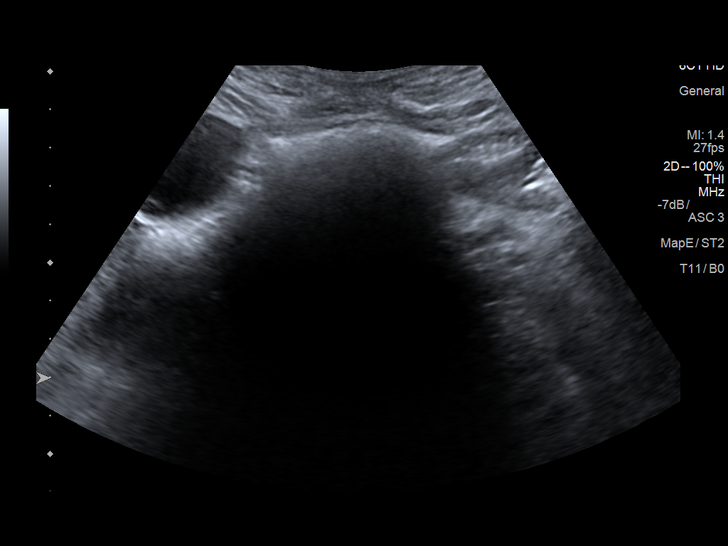

[Series 2002: us abdomen complete w/ elastography · 0.14mm/px · 1 of 12 slices shown (2 of 2)]
[im 12/12]
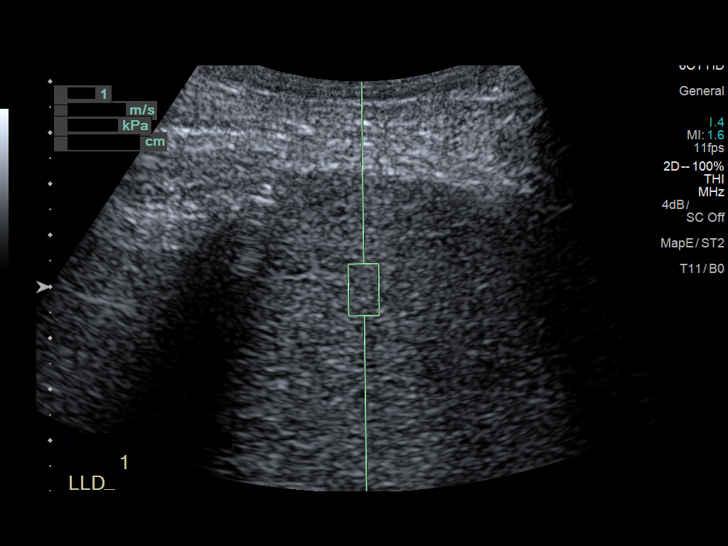

[13 of 25 positions shown; findings below may reference images not displayed]

FINDINGS: ULTRASOUND ABDOMEN

Gallbladder: No gallstones or wall thickening visualized. No
sonographic Murphy sign noted by sonographer.

Common bile duct: Diameter: 2.7 mm

Liver: No focal lesion identified. Within normal limits in
parenchymal echogenicity.

IVC: No abnormality visualized.

Pancreas: Visualized portion unremarkable.

Spleen: Size and appearance within normal limits.

Right Kidney: Length: 8 cm.. Increased parenchymal echogenicity. No
hydronephrosis or mass. Upper pole cyst measures 1.3 cm. Small
stones are identified measuring up to 5 mm.

Left Kidney: Length: 8 cm. Small stone measuring 7.2 mm. Cyst within
the inferior pole measures 1.1 cm.

Abdominal aorta: Aortic atherosclerosis noted.

Other findings: None.

ULTRASOUND HEPATIC ELASTOGRAPHY

Device: Siemens Helix VTQ

Patient position: Left Lateral Decubitus

Transducer [id number]

Number of measurements: 10

Hepatic segment:  8

Median velocity:   2.28  m/sec

IQR:

IQR/Median velocity ratio:

Corresponding Metavir fibrosis score:  Some F3 + F4

Risk of fibrosis: High

Limitations of exam: None

Pertinent findings noted on other imaging exams:  None

Please note that abnormal shear wave velocities may also be
identified in clinical settings other than with hepatic fibrosis,
such as: acute hepatitis, elevated right heart and central venous
pressures including use of beta blockers, Misier disease
(Ta), infiltrative processes such as
mastocytosis/amyloidosis/infiltrative tumor, extrahepatic
cholestasis, in the post-prandial state, and liver transplantation.
Correlation with patient history, laboratory data, and clinical
condition recommended.
IMPRESSION: ULTRASOUND ABDOMEN:

1. Bilateral echogenic kidneys suggestive of chronic medical renal
disease.
2. Bilateral nephrolithiasis
3. Kidney cysts

ULTRASOUND HEPATIC ELASTOGRAPHY:

Median hepatic shear wave velocity is calculated at 2.28 m/sec.

Corresponding Metavir fibrosis score is  Some F3 + F4.

Risk of fibrosis is High.

Follow-up: Follow up advised

## 2019-01-05 MED ORDER — HYDROMORPHONE HCL 1 MG/ML IJ SOLN
1.0000 mg | Freq: Once | INTRAMUSCULAR | Status: AC
  Administered 2019-01-05: 1 mg via INTRAVENOUS
  Filled 2019-01-05: qty 1

## 2019-01-05 MED ORDER — ONDANSETRON 4 MG PO TBDP
4.0000 mg | ORAL_TABLET | Freq: Once | ORAL | Status: AC
  Administered 2019-01-05: 4 mg via ORAL
  Filled 2019-01-05: qty 1

## 2019-01-05 NOTE — ED Notes (Signed)
ED Provider at bedside. 

## 2019-01-05 NOTE — Discharge Instructions (Signed)
Go straight to dialysis to get your session today.  If you develop worsening, continued, or recurrent abdominal pain, uncontrolled vomiting, fever, chest or back pain, or any other new/concerning symptoms then return to the ER for evaluation.

## 2019-01-05 NOTE — ED Provider Notes (Addendum)
[facility] EMERGENCY DEPARTMENT Provider Note   CSN: [id number] Arrival date & time: 01/05/19  0600    History   Chief Complaint Chief Complaint  Patient presents with  . Abdominal Pain    HPI [name] is a 58 y.o. male.     HPI  58 year old male presents with abdominal pain.  He states his been hurting for over 1 month.  It mostly is around his umbilicus and inferiorly.  No vomiting though feels nauseated sometimes.  He is on lactulose so he states he cannot comment on whether he has diarrhea.  No chest pain or shortness of breath.  Last went to dialysis 2 days ago on 6/24.  Last had a paracentesis about a week ago and states he does not feel like he needs 1 now.  No abdominal distention.  He states he needs something to help him sleep as he has been sleeping very poorly due to this pain.  Past Medical History:  Diagnosis Date  . Anemia   . Anxiety   . Arthritis    left shoulder  . Atherosclerosis of aorta ([place])   . Cardiomegaly   . Chest pain    DATE UNKNOWN, C/O PERIODICALLY  . Cocaine abuse ([place])   . COPD exacerbation ([place]) 08/17/2016  . Coronary artery disease    stent 02/22/17  . ESRD (end stage renal disease) on dialysis ([place])    "[name]; MWF" (07/04/2017)  . GERD (gastroesophageal reflux disease)    DATE UNKNOWN  . Hemorrhoids   . Hepatitis B, chronic ([place])   . Hepatitis C   . History of kidney stones   . Hyperkalemia   . Hypertension   . Kidney failure   . Metabolic bone disease    Patient denies  . Mitral stenosis   . Myocardial infarction ([place])   . Pneumonia   . Pulmonary edema   . Solitary rectal ulcer syndrome 07/2017   at [facility] for rectal bleeding  . Tubular adenoma of colon     Patient Active Problem List   Diagnosis Date Noted  . Volume overload 12/28/2018  . Sepsis ([place]) 09/12/2018  . Atherosclerosis of native coronary artery of native heart without angina pectoris 03/11/2018  . Benign neoplasm of  cecum   . Benign neoplasm of ascending colon   . Benign neoplasm of descending colon   . Benign neoplasm of rectum   . Paroxysmal atrial fibrillation ([place]) 01/23/2018  . Hx of colonic polyps 01/20/2018  . ESRD (end stage renal disease) ([place]) 11/21/2017  . GERD (gastroesophageal reflux disease) 11/16/2017  . Liver cirrhosis ([place]) 11/15/2017  . DNR (do not resuscitate)   . Palliative care by specialist   . Hyponatremia 11/04/2017  . SBP (spontaneous bacterial peritonitis) ([place]) 10/30/2017  . Liver disease, chronic 10/30/2017  . SOB (shortness of breath)   . Abdominal pain 10/28/2017  . Upper airway cough syndrome with flattening on f/v loop 10/13/17 c/w vcd 10/17/2017  . Elevated diaphragm 10/13/2017  . Ileus ([place]) 09/29/2017  . Malnutrition of moderate degree 09/29/2017  . Sinus congestion 09/03/2017  . Symptomatic anemia 09/02/2017  . Other cirrhosis of liver ([place]) 09/02/2017  . Left bundle branch block 09/02/2017  . Mitral stenosis 09/02/2017  . Hematochezia 07/15/2017  . Wide-complex tachycardia ([place])   . Endotracheally intubated   . ESRD on dialysis ([place]) 07/04/2017  . Acute respiratory failure with hypoxia ([place]) 06/18/2017  . CKD (chronic kidney disease) stage V requiring chronic dialysis (  [place]) 06/18/2017  . History of Cocaine abuse ([place]) 06/18/2017  . Hypertension 06/18/2017  . Infection of AV graft for dialysis ([place]) 06/18/2017  . Anxiety 06/18/2017  . Anemia due to chronic kidney disease 06/18/2017  . Atrial flutter with rapid ventricular response ([place]) 06/18/2017  . Personality disorder ([place]) 06/13/2017  . Cellulitis 06/12/2017  . Adjustment disorder with mixed anxiety and depressed mood 06/10/2017  . Suicidal ideation 06/10/2017  . Arm wound, left, sequela 06/10/2017  . Dyspnea on exertion 05/29/2017  . Tachycardia 05/29/2017  . Hyperkalemia 05/22/2017  . Acute metabolic encephalopathy   . Anemia 04/23/2017  . Ascites 04/23/2017  . COPD (chronic obstructive  pulmonary disease) ([place]) 04/23/2017  . Acute on chronic respiratory failure with hypoxia ([place]) 03/25/2017  . Arrhythmia 03/25/2017  . COPD GOLD 0 with flattening on inps f/v  09/27/2016  . Essential hypertension 09/27/2016  . Fluid overload 08/30/2016  . COPD exacerbation ([place]) 08/17/2016  . Hypertensive urgency 08/17/2016  . Respiratory failure ([place]) 08/17/2016  . Problem with dialysis access ([place]) 07/23/2016  . Chronic hepatitis B ([place]) 03/05/2014  . Chronic hepatitis C without hepatic coma ([place]) 03/05/2014  . Internal hemorrhoids with bleeding, swelling and itching 03/05/2014  . Thrombocytopenia ([place]) 03/05/2014  . Chest pain 02/27/2014  . Alcohol abuse 04/14/2009  . Cigarette smoker 04/14/2009  . GANGLION CYST 04/14/2009    Past Surgical History:  Procedure Laterality Date  . A/V FISTULAGRAM Left 05/26/2017   Procedure: A/V FISTULAGRAM;  Surgeon: Conrad , MD;  Location: [facility];  Service: Cardiovascular;  Laterality: Left;  . A/V FISTULAGRAM Right 11/18/2017   Procedure: A/V FISTULAGRAM - Right Arm;  Surgeon: Elam Dutch, MD;  Location: [facility];  Service: Cardiovascular;  Laterality: Right;  . APPLICATION OF WOUND VAC Left 06/14/2017   Procedure: APPLICATION OF WOUND VAC;  Surgeon: Katha Cabal, MD;  Location: [facility];  Service: Vascular;  Laterality: Left;  . AV FISTULA PLACEMENT  2012   BELIEVED WAS PLACED IN JUNE  . AV FISTULA PLACEMENT Right 08/09/2017   Procedure: Creation Right arm ARTERIOVENOUS BRACHIOCEPOHALIC FISTULA;  Surgeon: Elam Dutch, MD;  Location: [facility];  Service: Vascular;  Laterality: Right;  . AV FISTULA PLACEMENT Right 11/22/2017   Procedure: INSERTION OF ARTERIOVENOUS (AV) GORE-TEX GRAFT RIGHT UPPER ARM;  Surgeon: Elam Dutch, MD;  Location: [facility];  Service: Vascular;  Laterality: Right;  . BIOPSY  01/25/2018   Procedure: BIOPSY;  Surgeon: Jerene Bears, MD;  Location: [facility];  Service:  Gastroenterology;;  . COLONOSCOPY    . COLONOSCOPY WITH PROPOFOL N/A 01/25/2018   Procedure: COLONOSCOPY WITH PROPOFOL;  Surgeon: Jerene Bears, MD;  Location: [facility];  Service: Gastroenterology;  Laterality: N/A;  . CORONARY STENT INTERVENTION N/A 02/22/2017   Procedure: CORONARY STENT INTERVENTION;  Surgeon: Nigel Mormon, MD;  Location: [place] [facility];  Service: Cardiovascular;  Laterality: N/A;  . ESOPHAGOGASTRODUODENOSCOPY (EGD) WITH PROPOFOL N/A 01/25/2018   Procedure: ESOPHAGOGASTRODUODENOSCOPY (EGD) WITH PROPOFOL;  Surgeon: Jerene Bears, MD;  Location: [facility];  Service: Gastroenterology;  Laterality: N/A;  . FLEXIBLE SIGMOIDOSCOPY N/A 07/15/2017   Procedure: FLEXIBLE SIGMOIDOSCOPY;  Surgeon: Carol Ada, MD;  Location: [facility];  Service: Endoscopy;  Laterality: N/A;  . HEMORRHOID BANDING    . I&D EXTREMITY Left 06/01/2017   Procedure: IRRIGATION AND DEBRIDEMENT LEFT ARM HEMATOMA WITH LIGATION OF LEFT ARM AV FISTULA;  Surgeon: Elam Dutch, MD;  Location: [facility];  Service:  Vascular;  Laterality: Left;  . I&D EXTREMITY Left 06/14/2017   Procedure: IRRIGATION AND DEBRIDEMENT EXTREMITY;  Surgeon: Katha Cabal, MD;  Location: [facility];  Service: Vascular;  Laterality: Left;  . INSERTION OF DIALYSIS CATHETER  05/30/2017  . INSERTION OF DIALYSIS CATHETER N/A 05/30/2017   Procedure: INSERTION OF DIALYSIS CATHETER;  Surgeon: Elam Dutch, MD;  Location: [facility];  Service: Vascular;  Laterality: N/A;  . IR PARACENTESIS  08/30/2017  . IR PARACENTESIS  09/29/2017  . IR PARACENTESIS  10/28/2017  . IR PARACENTESIS  11/09/2017  . IR PARACENTESIS  11/16/2017  . IR PARACENTESIS  11/28/2017  . IR PARACENTESIS  12/01/2017  . IR PARACENTESIS  12/06/2017  . IR PARACENTESIS  01/03/2018  . IR PARACENTESIS  01/23/2018  . IR PARACENTESIS  02/07/2018  . IR PARACENTESIS  02/21/2018  . IR PARACENTESIS  03/06/2018  . IR PARACENTESIS  03/17/2018  . IR PARACENTESIS  04/04/2018  . IR  PARACENTESIS  12/28/2018  . IR RADIOLOGIST EVAL & MGMT  02/14/2018  . LEFT HEART CATH AND CORONARY ANGIOGRAPHY N/A 02/22/2017   Procedure: LEFT HEART CATH AND CORONARY ANGIOGRAPHY;  Surgeon: Nigel Mormon, MD;  Location: [facility];  Service: Cardiovascular;  Laterality: N/A;  . LIGATION OF ARTERIOVENOUS  FISTULA Left 12/13/8754   Procedure: Plication of Left Arm Arteriovenous Fistula;  Surgeon: Elam Dutch, MD;  Location: [facility];  Service: Vascular;  Laterality: Left;  . POLYPECTOMY    . POLYPECTOMY  01/25/2018   Procedure: POLYPECTOMY;  Surgeon: Jerene Bears, MD;  Location: [facility];  Service: Gastroenterology;;  . REVISON OF ARTERIOVENOUS FISTULA Left 4/33/2951   Procedure: PLICATION OF DISTAL ANEURYSMAL SEGEMENT OF LEFT UPPER ARM ARTERIOVENOUS FISTULA;  Surgeon: Elam Dutch, MD;  Location: [facility];  Service: Vascular;  Laterality: Left;  . REVISON OF ARTERIOVENOUS FISTULA Left 8/84/1660   Procedure: Plication of Left Upper Arm Fistula ;  Surgeon: Waynetta Sandy, MD;  Location: [facility];  Service: Vascular;  Laterality: Left;  . SKIN GRAFT SPLIT THICKNESS LEG / FOOT Left    SKIN GRAFT SPLIT THICKNESS LEFT ARM DONOR SITE: LEFT ANTERIOR THIGH  . SKIN SPLIT GRAFT Left 07/04/2017   Procedure: SKIN GRAFT SPLIT THICKNESS LEFT ARM DONOR SITE: LEFT ANTERIOR THIGH;  Surgeon: Elam Dutch, MD;  Location: [facility];  Service: Vascular;  Laterality: Left;  . THROMBECTOMY W/ EMBOLECTOMY Left 06/05/2017   Procedure: EXPLORATION OF LEFT ARM FOR BLEEDING; OVERSEWED PROXIMAL FISTULA;  Surgeon: Angelia Mould, MD;  Location: [facility];  Service: Vascular;  Laterality: Left;  . WOUND EXPLORATION Left 06/03/2017   Procedure: WOUND EXPLORATION WITH WOUND VAC APPLICATION TO LEFT ARM;  Surgeon: Angelia Mould, MD;  Location: [facility];  Service: Vascular;  Laterality: Left;        Home Medications    Prior to Admission medications   Medication Sig Start Date End Date  Taking? Authorizing Provider  albuterol (VENTOLIN HFA) 108 (90 Base) MCG/ACT inhaler Inhale 2 puffs into the lungs every 6 (six) hours as needed for wheezing or shortness of breath.   Yes [provider]  cephALEXin (KEFLEX) 500 MG capsule Take 500 mg by mouth 2 (two) times a day. 01/03/19  Yes [provider]  docusate sodium ([employer] STOOL SOFTENER) 250 MG capsule Take 250 mg by mouth daily.   Yes [provider]  lactulose (CHRONULAC) 10 GM/15ML solution Take 45 mLs (30 g total) by mouth 2 (two) times daily.  12/26/18  Yes Esterwood, Amy S, PA-C  metoprolol tartrate (LOPRESSOR) 25 MG tablet Take 1 tablet (25 mg total) by mouth 2 (two) times daily. 03/11/18  Yes Vann, Jessica U, DO  montelukast (SINGULAIR) 10 MG tablet Take 10 mg by mouth daily.   Yes [provider]  ondansetron (ZOFRAN) 4 MG tablet Take 4 mg by mouth every 8 (eight) hours as needed for nausea or vomiting.   Yes [provider]  pantoprazole (PROTONIX) 40 MG tablet Take 1 tablet (40 mg total) by mouth See admin instructions. Take one tablet (40 mg) by mouth in the morning on Sunday, Tuesday, Thursday, Saturday, take one tablet (40 mg) after dialysis on Monday, Wednesday, Friday. May also take one tablet (40 mg) in the evening as needed for acid reflux. 12/26/18  Yes Esterwood, Amy S, PA-C  sorbitol 70 % SOLN Take 30 mLs by mouth 3 (three) times daily as needed for constipation. 12/07/18  Yes [provider]  sulfamethoxazole-trimethoprim (BACTRIM DS) 800-160 MG tablet Take 1 tablet by mouth daily. 12/30/18  Yes Vann, Jessica U, DO  SYMBICORT 160-4.5 MCG/ACT inhaler Inhale 2 puffs into the lungs 2 (two) times a day. 11/30/18  Yes [provider]  zolpidem (AMBIEN) 5 MG tablet Take 5 mg by mouth at bedtime. 12/08/18  Yes [provider]  sevelamer carbonate (RENVELA) 800 MG tablet Take 4 tablets with meals  And 2 tablets twice daily with snacks Patient not taking: Reported on  12/28/2018 01/22/18   Patrecia Pour, MD    Family History Family History  Problem Relation Age of Onset  . Heart disease Mother   . Lung cancer Mother   . Heart disease Father   . Malignant hyperthermia Father   . COPD Father   . Throat cancer Sister   . Esophageal cancer Sister   . Hypertension Other   . COPD Other   . Colon cancer Neg Hx   . Colon polyps Neg Hx   . Rectal cancer Neg Hx   . Stomach cancer Neg Hx     Social History Social History   Tobacco Use  . Smoking status: Current Every Day Smoker    Packs/day: 0.50    Years: 43.00    Pack years: 21.50    Types: Cigarettes    Start date: 08/13/1973  . Smokeless tobacco: Never Used  Substance Use Topics  . Alcohol use: Not Currently    Frequency: Never    Comment: quit drinking in 2017  . Drug use: Yes    Types: Marijuana, Cocaine     Allergies   Morphine and related, Aspirin, Clonidine derivatives, Tramadol, and Tylenol [acetaminophen]   Review of Systems Review of Systems  Constitutional: Negative for fever.  Respiratory: Negative for shortness of breath.   Cardiovascular: Negative for chest pain.  Gastrointestinal: Positive for abdominal pain and nausea. Negative for vomiting.  All other systems reviewed and are negative.    Physical Exam Updated Vital Signs BP 138/89   Pulse 77   Temp (!) 97.5 F (36.4 C) (Oral)   Resp 18   Ht 5\' 9"  (1.753 m)   Wt 80 kg   SpO2 98%   BMI 26.05 kg/m   Physical Exam Vitals signs and nursing note reviewed.  Constitutional:      General: He is not in acute distress.    Appearance: He is well-developed. He is not ill-appearing or diaphoretic.  HENT:     Head: Normocephalic and atraumatic.  Right Ear: External ear normal.     Left Ear: External ear normal.     Nose: Nose normal.  Eyes:     General:        Right eye: No discharge.        Left eye: No discharge.  Neck:     Musculoskeletal: Neck supple.  Cardiovascular:     Rate and Rhythm: Normal  rate and regular rhythm.     Heart sounds: Normal heart sounds.  Pulmonary:     Effort: Pulmonary effort is normal.     Breath sounds: Normal breath sounds.  Abdominal:     General: There is no distension.     Palpations: Abdomen is soft.     Tenderness: There is generalized abdominal tenderness (mild).  Skin:    General: Skin is warm and dry.  Neurological:     Mental Status: He is alert.  Psychiatric:        Mood and Affect: Mood is not anxious.      ED Treatments / Results  Labs (all labs ordered are listed, but only abnormal results are displayed) Labs Reviewed  COMPREHENSIVE METABOLIC PANEL - Abnormal; Notable for the following components:      Result Value   Chloride 95 (*)    Glucose, Bld 133 (*)    BUN 35 (*)    Creatinine, Ser 7.84 (*)    AST 48 (*)    GFR calc non Af Amer 7 (*)    GFR calc Af Amer 8 (*)    All other components within normal limits  CBC - Abnormal; Notable for the following components:   RBC 3.83 (*)    Hemoglobin 10.9 (*)    HCT 31.1 (*)    RDW 16.3 (*)    Platelets 129 (*)    All other components within normal limits  LIPASE, BLOOD  MAGNESIUM  PHOSPHORUS  URINALYSIS, ROUTINE W REFLEX MICROSCOPIC    EKG EKG Interpretation  Date/Time:  Friday January 05 2019 06:11:47 EDT Ventricular Rate:  85 PR Interval:    QRS Duration: 166 QT Interval:  474 QTC Calculation: 564 R Axis:   -90 Text Interpretation:  Atrial fibrillation Nonspecific IVCD with LAD Left ventricular hypertrophy Borderline T abnormalities, lateral leads Tall T waves similar to January 02 2019 Confirmed by Sherwood Gambler [id number]) on 01/05/2019 7:13:28 AM   Radiology No results found.  Procedures Procedures (including critical care time)  Medications Ordered in ED Medications  HYDROmorphone (DILAUDID) injection 1 mg (1 mg Intravenous Given 01/05/19 0719)  ondansetron (ZOFRAN-ODT) disintegrating tablet 4 mg (4 mg Oral Given 01/05/19 0726)     Initial Impression /  Assessment and Plan / ED Course  I have reviewed the triage vital signs and the nursing notes.  Pertinent labs & imaging results that were available during my care of the patient were reviewed by me and considered in my medical decision making (see chart for details).        Patient appears to have chronic abdominal pain.  His labs are overall reassuring, compared to baseline.  Potassium is okay today.  Other electrolytes are without acute abnormalities.  WBC is normal and unchanged from a few days ago.  He does not appear to have clinically significant amount of ascites and my suspicion of something like SBP is quite low.  Earlier this month he had a CT scan without obvious pathology.  I think this is mostly chronic abdominal pain and at this point do not  think further ED work-up is needed.  There has been no vomiting.  He is requesting to eat.  At this point in the morning I have discussed he should try to go to dialysis, does not appear to need it emergently but should get his typical Friday session.  Patient does not make any urine. Discharged home with return precautions.  Final Clinical Impressions(s) / ED Diagnoses   Final diagnoses:  Chronic abdominal pain    ED Discharge Orders    None       Sherwood Gambler, MD 01/05/19 2811    Sherwood Gambler, MD 01/05/19 [phone]

## 2019-01-05 NOTE — ED Triage Notes (Addendum)
Patient arrived with EMS from home reports chronic generalized abdominal pain with nausea and emesis for > 1 month , no diarrhea or fever , hemodialysis q Mon/Wed/Fri.

## 2019-01-06 ENCOUNTER — Emergency Department (HOSPITAL_COMMUNITY): Payer: Medicare Other

## 2019-01-06 ENCOUNTER — Other Ambulatory Visit: Payer: Self-pay

## 2019-01-06 ENCOUNTER — Encounter (HOSPITAL_COMMUNITY): Payer: Self-pay | Admitting: Emergency Medicine

## 2019-01-06 ENCOUNTER — Emergency Department (HOSPITAL_COMMUNITY)
Admission: EM | Admit: 2019-01-06 | Discharge: 2019-01-06 | Disposition: A | Discharge status: Home / Self Care | Payer: Medicare Other | Attending: Emergency Medicine | Admitting: Emergency Medicine

## 2019-01-06 DIAGNOSIS — Z955 Presence of coronary angioplasty implant and graft: Secondary | ICD-10-CM | POA: Insufficient documentation

## 2019-01-06 DIAGNOSIS — I447 Left bundle-branch block, unspecified: Secondary | ICD-10-CM | POA: Diagnosis not present

## 2019-01-06 DIAGNOSIS — I120 Hypertensive chronic kidney disease with stage 5 chronic kidney disease or end stage renal disease: Secondary | ICD-10-CM | POA: Diagnosis not present

## 2019-01-06 DIAGNOSIS — G8929 Other chronic pain: Secondary | ICD-10-CM | POA: Diagnosis not present

## 2019-01-06 DIAGNOSIS — R1013 Epigastric pain: Secondary | ICD-10-CM | POA: Insufficient documentation

## 2019-01-06 DIAGNOSIS — Z79899 Other long term (current) drug therapy: Secondary | ICD-10-CM | POA: Insufficient documentation

## 2019-01-06 DIAGNOSIS — J449 Chronic obstructive pulmonary disease, unspecified: Secondary | ICD-10-CM | POA: Insufficient documentation

## 2019-01-06 DIAGNOSIS — F17210 Nicotine dependence, cigarettes, uncomplicated: Secondary | ICD-10-CM | POA: Diagnosis not present

## 2019-01-06 DIAGNOSIS — Z992 Dependence on renal dialysis: Secondary | ICD-10-CM | POA: Insufficient documentation

## 2019-01-06 DIAGNOSIS — N2581 Secondary hyperparathyroidism of renal origin: Secondary | ICD-10-CM | POA: Diagnosis not present

## 2019-01-06 DIAGNOSIS — I252 Old myocardial infarction: Secondary | ICD-10-CM | POA: Diagnosis not present

## 2019-01-06 DIAGNOSIS — D509 Iron deficiency anemia, unspecified: Secondary | ICD-10-CM | POA: Diagnosis not present

## 2019-01-06 DIAGNOSIS — I259 Chronic ischemic heart disease, unspecified: Secondary | ICD-10-CM | POA: Diagnosis not present

## 2019-01-06 DIAGNOSIS — I4891 Unspecified atrial fibrillation: Secondary | ICD-10-CM | POA: Diagnosis not present

## 2019-01-06 DIAGNOSIS — R1084 Generalized abdominal pain: Secondary | ICD-10-CM | POA: Diagnosis not present

## 2019-01-06 DIAGNOSIS — N186 End stage renal disease: Secondary | ICD-10-CM | POA: Insufficient documentation

## 2019-01-06 DIAGNOSIS — D631 Anemia in chronic kidney disease: Secondary | ICD-10-CM | POA: Diagnosis not present

## 2019-01-06 DIAGNOSIS — E875 Hyperkalemia: Secondary | ICD-10-CM | POA: Diagnosis not present

## 2019-01-06 DIAGNOSIS — Z23 Encounter for immunization: Secondary | ICD-10-CM | POA: Diagnosis not present

## 2019-01-06 DIAGNOSIS — R109 Unspecified abdominal pain: Secondary | ICD-10-CM | POA: Diagnosis not present

## 2019-01-06 LAB — CBC
HCT: 31.9 % — ABNORMAL LOW (ref 39.0–52.0)
Hemoglobin: 11.3 g/dL — ABNORMAL LOW (ref 13.0–17.0)
MCH: 28.6 pg (ref 26.0–34.0)
MCHC: 35.4 g/dL (ref 30.0–36.0)
MCV: 80.8 fL (ref 80.0–100.0)
Platelets: 136 10*3/uL — ABNORMAL LOW (ref 150–400)
RBC: 3.95 MIL/uL — ABNORMAL LOW (ref 4.22–5.81)
RDW: 16.3 % — ABNORMAL HIGH (ref 11.5–15.5)
WBC: 9.5 10*3/uL (ref 4.0–10.5)
nRBC: 0 % (ref 0.0–0.2)

## 2019-01-06 LAB — LIPASE, BLOOD: Lipase: 26 U/L (ref 11–51)

## 2019-01-06 LAB — COMPREHENSIVE METABOLIC PANEL
ALT: 29 U/L (ref 0–44)
AST: 38 U/L (ref 15–41)
Albumin: 4 g/dL (ref 3.5–5.0)
Alkaline Phosphatase: 92 U/L (ref 38–126)
Anion gap: 15 (ref 5–15)
BUN: 41 mg/dL — ABNORMAL HIGH (ref 6–20)
CO2: 25 mmol/L (ref 22–32)
Calcium: 10.4 mg/dL — ABNORMAL HIGH (ref 8.9–10.3)
Chloride: 97 mmol/L — ABNORMAL LOW (ref 98–111)
Creatinine, Ser: 8.91 mg/dL — ABNORMAL HIGH (ref 0.61–1.24)
GFR calc Af Amer: 7 mL/min — ABNORMAL LOW (ref >60)
GFR calc non Af Amer: 6 mL/min — ABNORMAL LOW (ref >60)
Glucose, Bld: 97 mg/dL (ref 70–99)
Potassium: 5.8 mmol/L — ABNORMAL HIGH (ref 3.5–5.1)
Sodium: 137 mmol/L (ref 135–145)
Total Bilirubin: 1.6 mg/dL — ABNORMAL HIGH (ref 0.3–1.2)
Total Protein: 7.2 g/dL (ref 6.5–8.1)

## 2019-01-06 MED ORDER — FENTANYL CITRATE (PF) 100 MCG/2ML IJ SOLN
50.0000 ug | Freq: Once | INTRAMUSCULAR | Status: AC
  Administered 2019-01-06: 50 ug via INTRAVENOUS
  Filled 2019-01-06: qty 2

## 2019-01-06 MED ORDER — ONDANSETRON HCL 4 MG/2ML IJ SOLN
4.0000 mg | Freq: Once | INTRAMUSCULAR | Status: AC
  Administered 2019-01-06: 4 mg via INTRAVENOUS
  Filled 2019-01-06: qty 2

## 2019-01-06 MED ORDER — SODIUM ZIRCONIUM CYCLOSILICATE 10 G PO PACK
10.0000 g | PACK | Freq: Once | ORAL | Status: DC
  Filled 2019-01-06: qty 1

## 2019-01-06 MED ORDER — SODIUM CHLORIDE 0.9% FLUSH: 3.0000 mL | Freq: Once | INTRAVENOUS | Status: DC

## 2019-01-06 NOTE — ED Notes (Signed)
Patient refused discharge vitals. Very upset that meal try had not arrived before discharge. Initially refused to go to hemodialysis and stated that he was using cab voucher to go home instead. Provider patient with [place] sandwich after lenghty discussion. Still very upset at [place].

## 2019-01-06 NOTE — Discharge Instructions (Addendum)
You were evaluated in the Emergency Department and after careful evaluation, we did not find any emergent condition requiring admission or further testing in the hospital.  It is important that you go to your dialysis center today before 1030 for dialysis.  You should also be receiving a call from our interventional radiologist for scheduling of outpatient paracentesis to drain your abdomen.  Please return to the Emergency Department if you experience any worsening of your condition.  We encourage you to follow up with a primary care provider.  Thank you for allowing [place] to be a part of your care.

## 2019-01-06 NOTE — ED Notes (Signed)
Patient is refusing to be placed on cardiac monitor until he speaks with MD and he gets some pain medication.

## 2019-01-06 NOTE — ED Notes (Signed)
Patient transported to X-ray 

## 2019-01-06 NOTE — ED Provider Notes (Signed)
  Provider Note MRN:  [id number]  Arrival date & time: 01/06/19    ED Course and Medical Decision Making  Assumed care from Dr. Wyvonnia Dusky at shift change.  Missed dialysis, hyperkalemia, ascitic fluid, will need admission for dialysis and likely paracentesis.  9:21 AM update: received call from nephrology, patient appropriate for outpatient HD today at his facility, can be fit in before 1030am.  Upon my reevaluation, patient's abdomen is nontender, mild to moderately distended, patient is exhibited no signs of fever, normal vital signs, nothing to suggest SBP.  Patient is requesting food.  Discussed case with interventional radiology, who agrees to call patient on Monday to schedule outpatient paracentesis.  Currently there seems to be no reason for this to be done emergently or as an inpatient.  Discussed case with hospitalist as well as a consult, who agrees with outpatient plan.  Case management to facilitate transport to dialysis center.  After the discussed management above, the patient was determined to be safe for discharge.  The patient was in agreement with this plan and all questions regarding their care were answered.  ED return precautions were discussed and the patient will return to the ED with any significant worsening of condition.  Final Clinical Impressions(s) / ED Diagnoses     ICD-10-CM   1. Hyperkalemia  E87.5   2. Epigastric abdominal pain  R10.13     ED Discharge Orders    None       Barth Kirks. Sedonia Small, [place] [email or web address]    Maudie Flakes, MD 01/06/19 [phone]

## 2019-01-06 NOTE — ED Provider Notes (Signed)
[facility] EMERGENCY DEPARTMENT Provider Note   CSN: [id number] Arrival date & time: 01/06/19  0059     History   Chief Complaint Chief Complaint  Patient presents with   Abdominal Pain    HPI [name] is a 58 y.o. male.     [name] is a 58 y.o. male with medical history significant of ESRD, cirrhosis due to EtOH abuse + HCV + HBV, CAD s/p stent 8/18, cocaine abuse.  Here with epigastric abdominal pain that has been there for "months and months".  He was seen yesterday for the same pain.  States he missed dialysis yesterday because of the ongoing pain.  He does not know what causing it.  He denies any fevers.  He has had nausea but no vomiting.  No constipation.  Does not make any urine.  Still has gallbladder and appendix.  He gets regular paracenteses last one was about 10 days ago.  No chest pain or shortness of breath.  Epigastric abdominal pain is similar to previous issues. statesdiarrhea at baseline from lactulose use  The history is provided by the patient and the EMS personnel.  Abdominal Pain Associated symptoms: nausea   Associated symptoms: no dysuria, no hematuria, no shortness of breath and no vomiting     Past Medical History:  Diagnosis Date   Anemia    Anxiety    Arthritis    left shoulder   Atherosclerosis of aorta (HCC)    Cardiomegaly    Chest pain    DATE UNKNOWN, C/O PERIODICALLY   Cocaine abuse (HCC)    COPD exacerbation ([place]) 08/17/2016   Coronary artery disease    stent 02/22/17   ESRD (end stage renal disease) on dialysis ([place])    "[name]; MWF" (07/04/2017)   GERD (gastroesophageal reflux disease)    DATE UNKNOWN   Hemorrhoids    Hepatitis B, chronic (HCC)    Hepatitis C    History of kidney stones    Hyperkalemia    Hypertension    Kidney failure    Metabolic bone disease    Patient denies   Mitral stenosis    Myocardial infarction [facility])    Pneumonia     Pulmonary edema    Solitary rectal ulcer syndrome 07/2017   at flex sig for rectal bleeding   Tubular adenoma of colon     Patient Active Problem List   Diagnosis Date Noted   Volume overload 12/28/2018   Sepsis ([place]) 09/12/2018   Atherosclerosis of native coronary artery of native heart without angina pectoris 03/11/2018   Benign neoplasm of cecum    Benign neoplasm of ascending colon    Benign neoplasm of descending colon    Benign neoplasm of rectum    Paroxysmal atrial fibrillation ([place]) 01/23/2018   Hx of colonic polyps 01/20/2018   ESRD (end stage renal disease) ([place]) 11/21/2017   GERD (gastroesophageal reflux disease) 11/16/2017   Liver cirrhosis ([place]) 11/15/2017   DNR (do not resuscitate)    Palliative care by specialist    Hyponatremia 11/04/2017   SBP (spontaneous bacterial peritonitis) ([place]) 10/30/2017   Liver disease, chronic 10/30/2017   SOB (shortness of breath)    Abdominal pain 10/28/2017   Upper airway cough syndrome with flattening on f/v loop 10/13/17 c/w vcd 10/17/2017   Elevated diaphragm 10/13/2017   Ileus ([place]) 09/29/2017   Malnutrition of moderate degree 09/29/2017   Sinus congestion 09/03/2017   Symptomatic anemia 09/02/2017   Other cirrhosis  of liver ([place]) 09/02/2017   Left bundle branch block 09/02/2017   Mitral stenosis 09/02/2017   Hematochezia 07/15/2017   Wide-complex tachycardia ([place])    Endotracheally intubated    ESRD on dialysis ([place]) 07/04/2017   Acute respiratory failure with hypoxia ([place]) 06/18/2017   CKD (chronic kidney disease) stage V requiring chronic dialysis ([place]) 06/18/2017   History of Cocaine abuse ([place]) 06/18/2017   Hypertension 06/18/2017   Infection of AV graft for dialysis ([place]) 06/18/2017   Anxiety 06/18/2017   Anemia due to chronic kidney disease 06/18/2017   Atrial flutter with rapid ventricular response ([place]) 06/18/2017   Personality disorder ([place]) 06/13/2017   Cellulitis  06/12/2017   Adjustment disorder with mixed anxiety and depressed mood 06/10/2017   Suicidal ideation 06/10/2017   Arm wound, left, sequela 06/10/2017   Dyspnea on exertion 05/29/2017   Tachycardia 05/29/2017   Hyperkalemia 23/76/2831   Acute metabolic encephalopathy    Anemia 04/23/2017   Ascites 04/23/2017   COPD (chronic obstructive pulmonary disease) ([place]) 04/23/2017   Acute on chronic respiratory failure with hypoxia ([place]) 03/25/2017   Arrhythmia 03/25/2017   COPD GOLD 0 with flattening on inps f/v  09/27/2016   Essential hypertension 09/27/2016   Fluid overload 08/30/2016   COPD exacerbation ([place]) 08/17/2016   Hypertensive urgency 08/17/2016   Respiratory failure ([place]) 08/17/2016   Problem with dialysis access ([place]) 07/23/2016   Chronic hepatitis B ([place]) 03/05/2014   Chronic hepatitis C without hepatic coma ([place]) 03/05/2014   Internal hemorrhoids with bleeding, swelling and itching 03/05/2014   Thrombocytopenia ([place]) 03/05/2014   Chest pain 02/27/2014   Alcohol abuse 04/14/2009   Cigarette smoker 04/14/2009   GANGLION CYST 04/14/2009    Past Surgical History:  Procedure Laterality Date   A/V FISTULAGRAM Left 05/26/2017   Procedure: A/V FISTULAGRAM;  Surgeon: Conrad Sam Rayburn, MD;  Location: [place] [facility];  Service: Cardiovascular;  Laterality: Left;   A/V FISTULAGRAM Right 11/18/2017   Procedure: A/V FISTULAGRAM - Right Arm;  Surgeon: Elam Dutch, MD;  Location: [place] [facility];  Service: Cardiovascular;  Laterality: Right;   APPLICATION OF WOUND VAC Left 06/14/2017   Procedure: APPLICATION OF WOUND VAC;  Surgeon: Katha Cabal, MD;  Location: [facility] ORS;  Service: Vascular;  Laterality: Left;   AV FISTULA PLACEMENT  2012   BELIEVED WAS PLACED IN JUNE   AV FISTULA PLACEMENT Right 08/09/2017   Procedure: Creation Right arm ARTERIOVENOUS BRACHIOCEPOHALIC FISTULA;  Surgeon: Elam Dutch, MD;  Location: [facility] OR;  Service:  Vascular;  Laterality: Right;   AV FISTULA PLACEMENT Right 11/22/2017   Procedure: INSERTION OF ARTERIOVENOUS (AV) GORE-TEX GRAFT RIGHT UPPER ARM;  Surgeon: Elam Dutch, MD;  Location: [place];  Service: Vascular;  Laterality: Right;   BIOPSY  01/25/2018   Procedure: BIOPSY;  Surgeon: Jerene Bears, MD;  Location: [place];  Service: Gastroenterology;;   COLONOSCOPY     COLONOSCOPY WITH PROPOFOL N/A 01/25/2018   Procedure: COLONOSCOPY WITH PROPOFOL;  Surgeon: Jerene Bears, MD;  Location: [place];  Service: Gastroenterology;  Laterality: N/A;   CORONARY STENT INTERVENTION N/A 02/22/2017   Procedure: CORONARY STENT INTERVENTION;  Surgeon: Nigel Mormon, MD;  Location: [place] CV LAB;  Service: Cardiovascular;  Laterality: N/A;   ESOPHAGOGASTRODUODENOSCOPY (EGD) WITH PROPOFOL N/A 01/25/2018   Procedure: ESOPHAGOGASTRODUODENOSCOPY (EGD) WITH PROPOFOL;  Surgeon: Jerene Bears, MD;  Location: [place];  Service: Gastroenterology;  Laterality: N/A;   FLEXIBLE SIGMOIDOSCOPY N/A 07/15/2017  Procedure: FLEXIBLE SIGMOIDOSCOPY;  Surgeon: Carol Ada, MD;  Location: [place];  Service: Endoscopy;  Laterality: N/A;   HEMORRHOID BANDING     I&D EXTREMITY Left 06/01/2017   Procedure: IRRIGATION AND DEBRIDEMENT LEFT ARM HEMATOMA WITH LIGATION OF LEFT ARM AV FISTULA;  Surgeon: Elam Dutch, MD;  Location: [place];  Service: Vascular;  Laterality: Left;   I&D EXTREMITY Left 06/14/2017   Procedure: IRRIGATION AND DEBRIDEMENT EXTREMITY;  Surgeon: Katha Cabal, MD;  Location: [facility];  Service: Vascular;  Laterality: Left;   INSERTION OF DIALYSIS CATHETER  05/30/2017   INSERTION OF DIALYSIS CATHETER N/A 05/30/2017   Procedure: INSERTION OF DIALYSIS CATHETER;  Surgeon: Elam Dutch, MD;  Location: [place];  Service: Vascular;  Laterality: N/A;   IR PARACENTESIS  08/30/2017   IR PARACENTESIS  09/29/2017   IR PARACENTESIS  10/28/2017   IR PARACENTESIS  11/09/2017     IR PARACENTESIS  11/16/2017   IR PARACENTESIS  11/28/2017   IR PARACENTESIS  12/01/2017   IR PARACENTESIS  12/06/2017   IR PARACENTESIS  01/03/2018   IR PARACENTESIS  01/23/2018   IR PARACENTESIS  02/07/2018   IR PARACENTESIS  02/21/2018   IR PARACENTESIS  03/06/2018   IR PARACENTESIS  03/17/2018   IR PARACENTESIS  04/04/2018   IR PARACENTESIS  12/28/2018   IR RADIOLOGIST EVAL & MGMT  02/14/2018   LEFT HEART CATH AND CORONARY ANGIOGRAPHY N/A 02/22/2017   Procedure: LEFT HEART CATH AND CORONARY ANGIOGRAPHY;  Surgeon: Nigel Mormon, MD;  Location: [place] [facility];  Service: Cardiovascular;  Laterality: N/A;   LIGATION OF ARTERIOVENOUS  FISTULA Left 07/13/8784   Procedure: Plication of Left Arm Arteriovenous Fistula;  Surgeon: Elam Dutch, MD;  Location: [facility] OR;  Service: Vascular;  Laterality: Left;   POLYPECTOMY     POLYPECTOMY  01/25/2018   Procedure: POLYPECTOMY;  Surgeon: Jerene Bears, MD;  Location: [place];  Service: Gastroenterology;;   REVISON OF ARTERIOVENOUS FISTULA Left 7/67/2094   Procedure: PLICATION OF DISTAL ANEURYSMAL SEGEMENT OF LEFT UPPER ARM ARTERIOVENOUS FISTULA;  Surgeon: Elam Dutch, MD;  Location: [place];  Service: Vascular;  Laterality: Left;   REVISON OF ARTERIOVENOUS FISTULA Left 01/17/6282   Procedure: Plication of Left Upper Arm Fistula ;  Surgeon: Waynetta Sandy, MD;  Location: [place];  Service: Vascular;  Laterality: Left;   SKIN GRAFT SPLIT THICKNESS LEG / FOOT Left    SKIN GRAFT SPLIT THICKNESS LEFT ARM DONOR SITE: LEFT ANTERIOR THIGH   SKIN SPLIT GRAFT Left 07/04/2017   Procedure: SKIN GRAFT SPLIT THICKNESS LEFT ARM DONOR SITE: LEFT ANTERIOR THIGH;  Surgeon: Elam Dutch, MD;  Location: [place];  Service: Vascular;  Laterality: Left;   THROMBECTOMY W/ EMBOLECTOMY Left 06/05/2017   Procedure: EXPLORATION OF LEFT ARM FOR BLEEDING; OVERSEWED PROXIMAL FISTULA;  Surgeon: Angelia Mould, MD;  Location: [place];   Service: Vascular;  Laterality: Left;   WOUND EXPLORATION Left 06/03/2017   Procedure: WOUND EXPLORATION WITH WOUND VAC APPLICATION TO LEFT ARM;  Surgeon: Angelia Mould, MD;  Location: [facility] OR;  Service: Vascular;  Laterality: Left;        Home Medications    Prior to Admission medications   Medication Sig Start Date End Date Taking? Authorizing Provider  albuterol (VENTOLIN HFA) 108 (90 Base) MCG/ACT inhaler Inhale 2 puffs into the lungs every 6 (six) hours as needed for wheezing or shortness of breath.    [provider]  cephALEXin (  KEFLEX) 500 MG capsule Take 500 mg by mouth 2 (two) times a day. 01/03/19   [provider]  docusate sodium ([employer] STOOL SOFTENER) 250 MG capsule Take 250 mg by mouth daily.    [provider]  lactulose (CHRONULAC) 10 GM/15ML solution Take 45 mLs (30 g total) by mouth 2 (two) times daily. 12/26/18   Esterwood, Amy S, PA-C  metoprolol tartrate (LOPRESSOR) 25 MG tablet Take 1 tablet (25 mg total) by mouth 2 (two) times daily. 03/11/18   Geradine Girt, DO  montelukast (SINGULAIR) 10 MG tablet Take 10 mg by mouth daily.    [provider]  ondansetron (ZOFRAN) 4 MG tablet Take 4 mg by mouth every 8 (eight) hours as needed for nausea or vomiting.    [provider]  pantoprazole (PROTONIX) 40 MG tablet Take 1 tablet (40 mg total) by mouth See admin instructions. Take one tablet (40 mg) by mouth in the morning on Sunday, Tuesday, Thursday, Saturday, take one tablet (40 mg) after dialysis on Monday, Wednesday, Friday. May also take one tablet (40 mg) in the evening as needed for acid reflux. 12/26/18   Esterwood, Amy S, PA-C  sevelamer carbonate (RENVELA) 800 MG tablet Take 4 tablets with meals  And 2 tablets twice daily with snacks Patient not taking: Reported on 12/28/2018 01/22/18   Patrecia Pour, MD  sorbitol 70 % SOLN Take 30 mLs by mouth 3 (three) times daily as needed for constipation. 12/07/18   [provider]  sulfamethoxazole-trimethoprim (BACTRIM DS) 800-160 MG tablet Take 1 tablet by mouth daily. 12/30/18   Geradine Girt, DO  SYMBICORT 160-4.5 MCG/ACT inhaler Inhale 2 puffs into the lungs 2 (two) times a day. 11/30/18   [provider]  zolpidem (AMBIEN) 5 MG tablet Take 5 mg by mouth at bedtime. 12/08/18   [provider]    Family History Family History  Problem Relation Age of Onset   Heart disease Mother    Lung cancer Mother    Heart disease Father    Malignant hyperthermia Father    COPD Father    Throat cancer Sister    Esophageal cancer Sister    Hypertension Other    COPD Other    Colon cancer Neg Hx    Colon polyps Neg Hx    Rectal cancer Neg Hx    Stomach cancer Neg Hx     Social History Social History   Tobacco Use   Smoking status: Current Every Day Smoker    Packs/day: 0.50    Years: 43.00    Pack years: 21.50    Types: Cigarettes    Start date: 08/13/1973   Smokeless tobacco: Never Used  Substance Use Topics   Alcohol use: Not Currently    Frequency: Never    Comment: quit drinking in 2017   Drug use: Yes    Types: Marijuana, Cocaine     Allergies   Morphine and related, Aspirin, Clonidine derivatives, Tramadol, and Tylenol [acetaminophen]   Review of Systems Review of Systems  Constitutional: Positive for activity change and appetite change.  HENT: Negative for congestion and rhinorrhea.   Respiratory: Negative for chest tightness and shortness of breath.   Gastrointestinal: Positive for abdominal pain and nausea. Negative for vomiting.  Genitourinary: Negative for dysuria and hematuria.  Musculoskeletal: Negative for arthralgias and myalgias.  Skin: Negative for rash.  Neurological: Negative for dizziness, weakness and headaches.   all other systems are negative except as noted  in the HPI and PMH.     Physical Exam Updated Vital Signs BP (!) 126/91 (BP Location: Right Arm)    Pulse 92    Temp  98.1 F (36.7 C)    Resp 18    Ht 5\' 9"  (1.753 m)    Wt 72.6 kg    SpO2 95%    BMI 23.63 kg/m   Physical Exam Vitals signs and nursing note reviewed.  Constitutional:      General: He is not in acute distress.    Appearance: He is well-developed. He is ill-appearing.     Comments: Chronically ill-appearing  HENT:     Head: Normocephalic and atraumatic.     Mouth/Throat:     Pharynx: No oropharyngeal exudate.  Eyes:     Conjunctiva/sclera: Conjunctivae normal.     Pupils: Pupils are equal, round, and reactive to light.  Neck:     Musculoskeletal: Normal range of motion and neck supple.     Comments: No meningismus. Cardiovascular:     Rate and Rhythm: Normal rate. Rhythm irregular.     Heart sounds: Normal heart sounds. No murmur.  Pulmonary:     Effort: Pulmonary effort is normal. No respiratory distress.     Breath sounds: Normal breath sounds.  Abdominal:     Palpations: Abdomen is soft. There is fluid wave.     Tenderness: There is abdominal tenderness. There is guarding. There is no rebound.     Comments: Mild distention, epigastric tenderness with voluntary guarding  Musculoskeletal: Normal range of motion.        General: No tenderness.     Comments:  AV fistula left upper extremity with thrill  Skin:    General: Skin is warm.     Capillary Refill: Capillary refill takes less than 2 seconds.  Neurological:     General: No focal deficit present.     Mental Status: He is alert and oriented to person, place, and time. Mental status is at baseline.     Cranial Nerves: No cranial nerve deficit.     Motor: No abnormal muscle tone.     Coordination: Coordination normal.     Comments: No ataxia on finger to nose bilaterally. No pronator drift. 5/5 strength throughout. CN 2-12 intact.Equal grip strength. Sensation intact.   Psychiatric:        Behavior: Behavior normal.      ED Treatments / Results  Labs (all labs ordered are listed, but only abnormal results are  displayed) Labs Reviewed  COMPREHENSIVE METABOLIC PANEL - Abnormal; Notable for the following components:      Result Value   Potassium 5.8 (*)    Chloride 97 (*)    BUN 41 (*)    Creatinine, Ser 8.91 (*)    Calcium 10.4 (*)    Total Bilirubin 1.6 (*)    GFR calc non Af Amer 6 (*)    GFR calc Af Amer 7 (*)    All other components within normal limits  CBC - Abnormal; Notable for the following components:   RBC 3.95 (*)    Hemoglobin 11.3 (*)    HCT 31.9 (*)    RDW 16.3 (*)    Platelets 136 (*)    All other components within normal limits  LIPASE, BLOOD  URINALYSIS, ROUTINE W REFLEX MICROSCOPIC    EKG None  Radiology No results found.  Procedures Procedures (including critical care time)  Medications Ordered in ED Medications  sodium chloride flush (NS) 0.9 %  injection 3 mL (has no administration in time range)     Initial Impression / Assessment and Plan / ED Course  I have reviewed the triage vital signs and the nursing notes.  Pertinent labs & imaging results that were available during my care of the patient were reviewed by me and considered in my medical decision making (see chart for details).       Acute on chronic abdominal pain. No fever or vomiting.  Hyperkalemia today at 5.8. Afib with RVR, mildly widened QRS. No fever to suggest SBP.  D/w Dr. Royce Macadamia of nephrology. She will arrange for dialysis this morning. No significant SOB. Stable on 2 L . Does not recommend any meds for hyperkalemia.   Patient likely also would benefit from paracentesis as well. Recurrent pain, unlikely to be SBP without fever or leukocytosis.    Awaiting hospitalist call back at shift change.   Final Clinical Impressions(s) / ED Diagnoses   Final diagnoses:  None    ED Discharge Orders    None       Octavious Zidek, Annie Main, MD 01/06/19 [phone]

## 2019-01-06 NOTE — ED Notes (Signed)
Ordered diet tray for pt  

## 2019-01-06 NOTE — Care Management (Signed)
ED CM was consulted to assist patient with getting to HD for treatment at [facility] for chair time at 10:30.   CM provided taxi voucher.

## 2019-01-06 NOTE — ED Notes (Signed)
Pt refusing cardiac monitor at this time due to wanting pain meds and to speak to the doctor.

## 2019-01-06 NOTE — ED Triage Notes (Signed)
Brought by ems for c/o abdominal pain, n/v.  Was seen yesterday for the same.  Missed dialysis due to feeling bad.

## 2019-01-06 NOTE — ED Notes (Addendum)
Patient returned from xray. ED Provider, Bero at bedside.

## 2019-01-07 IMAGING — CT CT ANGIO CHEST-ABD-PELV FOR DISSECTION W/ AND WO/W CM
2 of 7 series · 12 of 46 positions shown, 14 images · IV contrast (APPLIED)
Comparison: Chest radiograph dated 07/08/2017

CLINICAL DATA: 56-year-old male with acute respiratory illness.
Shortness of breath and tachycardia. Dialysis patient.

EXAM:
CT ANGIOGRAPHY CHEST, ABDOMEN AND PELVIS
TECHNIQUE: Multidetector CT imaging through the chest, abdomen and pelvis was
performed using the standard protocol during bolus administration of
intravenous contrast. Multiplanar reconstructed images and MIPs were
obtained and reviewed to evaluate the vascular anatomy.
CONTRAST:  2MJX5V-VHN IOPAMIDOL (2MJX5V-VHN) INJECTION 76%

[Series 13: arterial · axial · arterial · 0.66mm/px · z∈[-784,-180]mm · 9 of 340 slices shown, 11 images]
[im 19/340  soft-tissue]
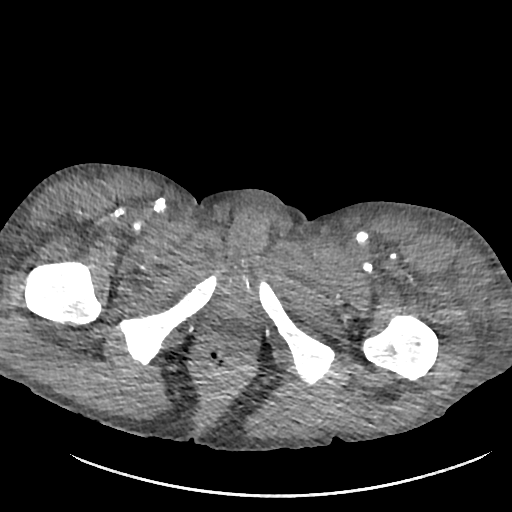
[im 19/340  bone]
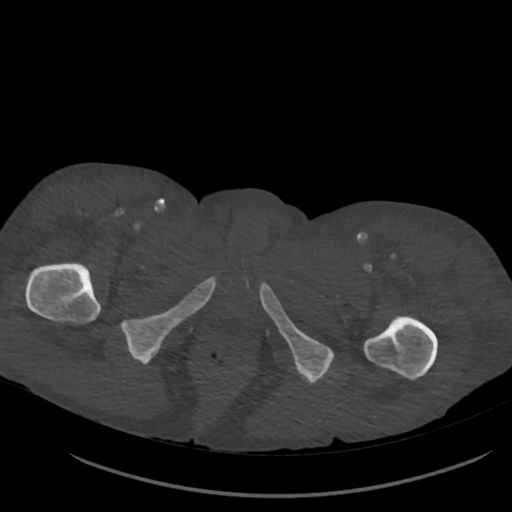
[im 57/340  soft-tissue]
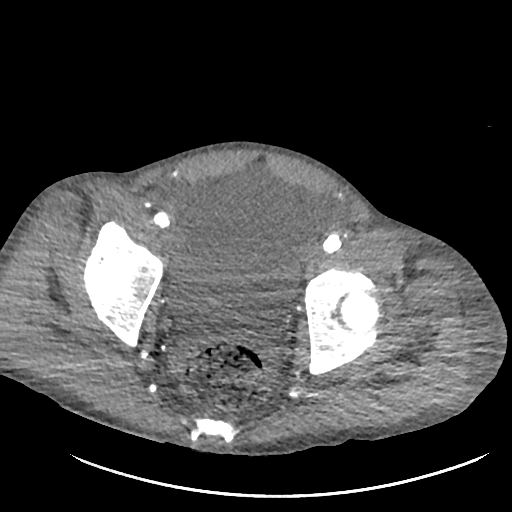
[im 95/340  soft-tissue]
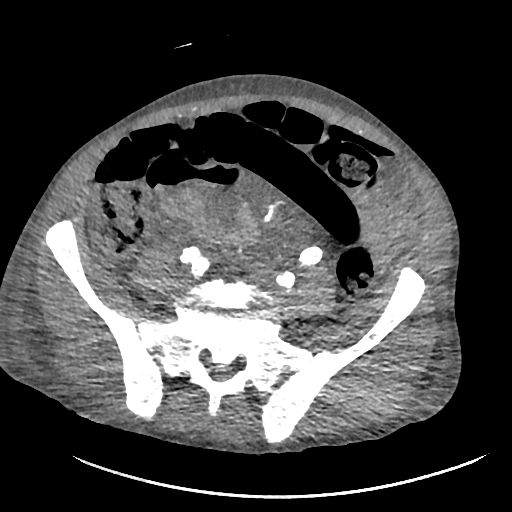
[im 132/340  soft-tissue]
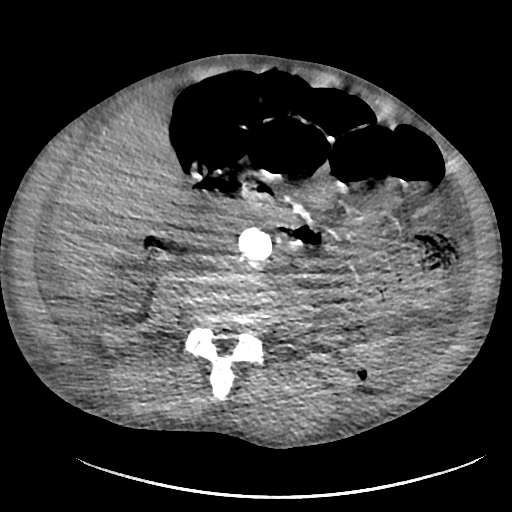
[im 170/340  soft-tissue]
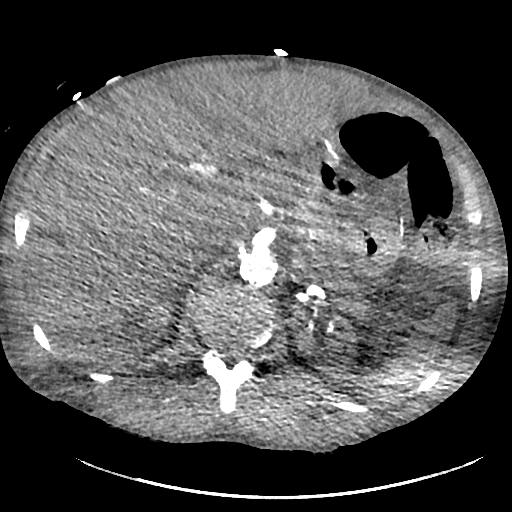
[im 208/340  soft-tissue]
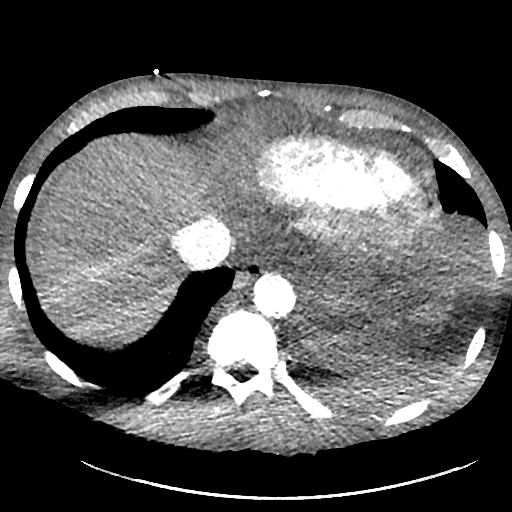
[im 245/340  soft-tissue]
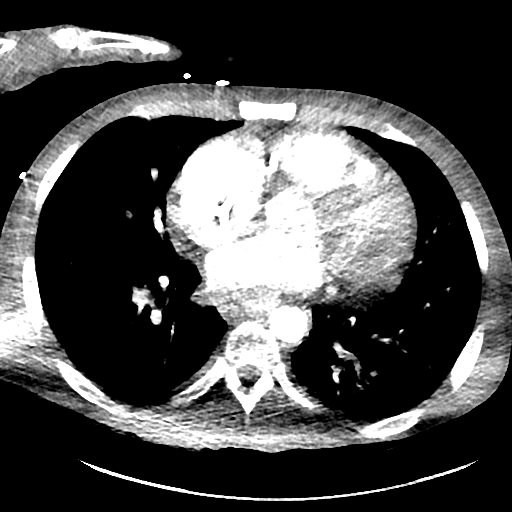
[im 283/340  soft-tissue]
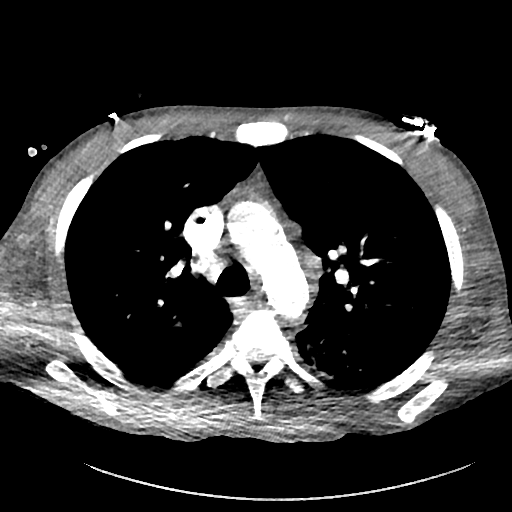
[im 321/340  soft-tissue]
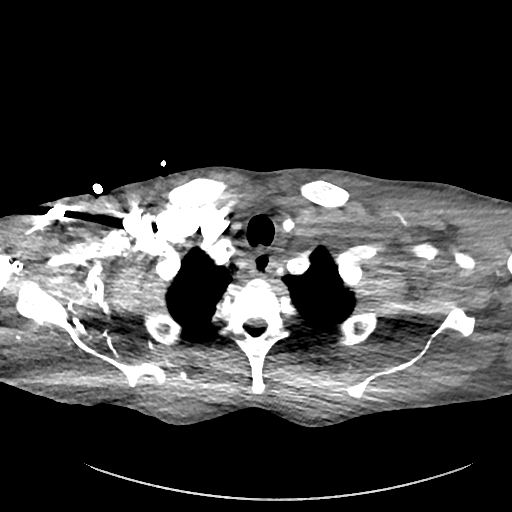
[im 321/340  bone]
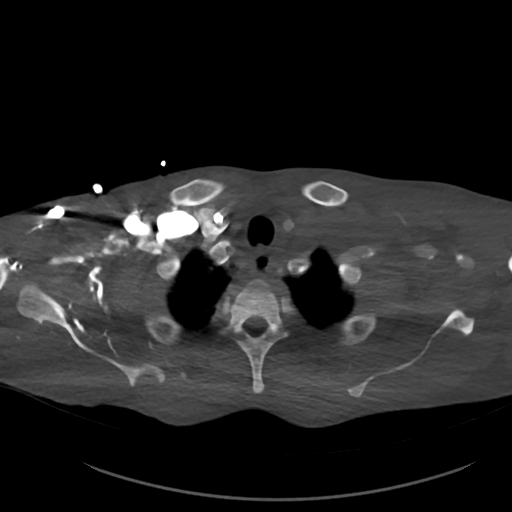

[Series 16: cor · coronal · 0.74mm/px · 3 of 124 slices shown]
[im 31/124  soft-tissue]
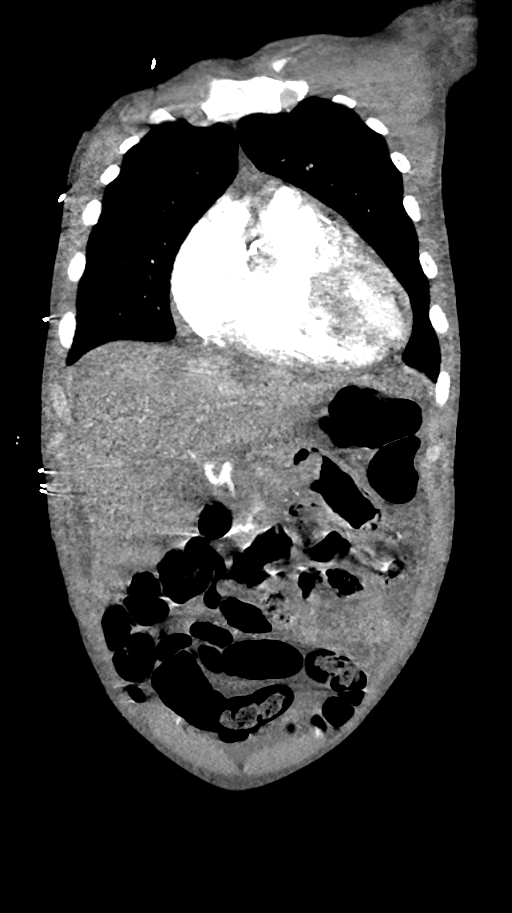
[im 62/124  soft-tissue]
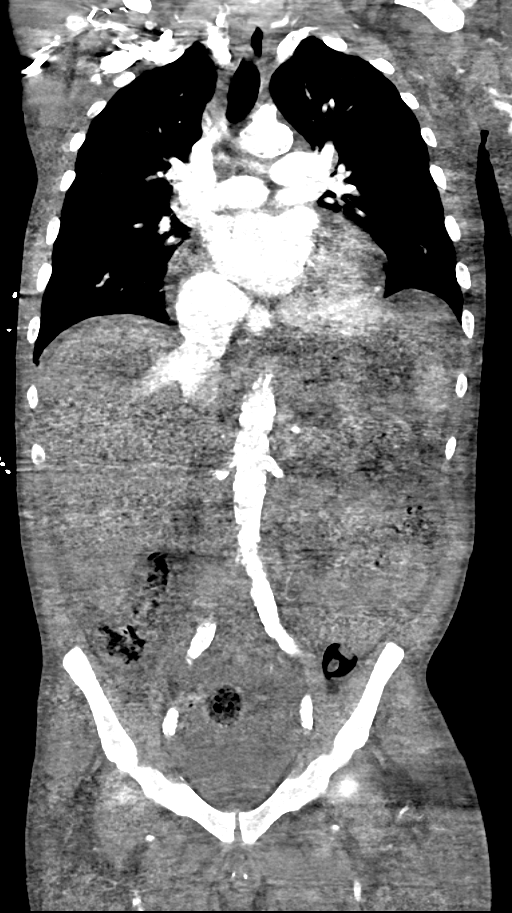
[im 93/124  soft-tissue]
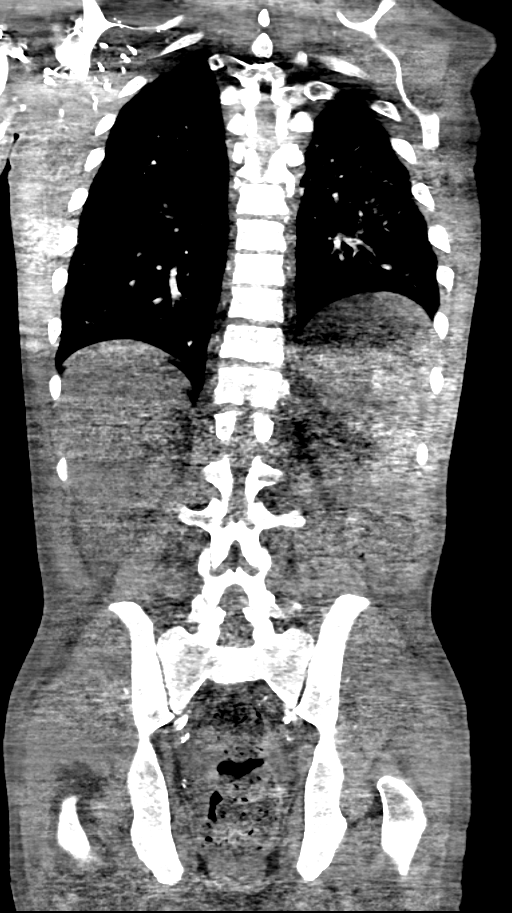

[12 of 46 positions shown; findings below may reference images not displayed]

FINDINGS: Evaluation is limited due to streak artifact caused by patient's
arms as well as anasarca.

CTA CHEST FINDINGS

Cardiovascular: There is moderate cardiomegaly. There is dilatation
of the right atrium with retrograde flow of contrast into the IVC
consistent with a degree of right heart dysfunction. Correlation
with clinical exam and echocardiogram recommended. There is multi
vessel coronary vascular calcification. Pericardial effusion
measures 14 mm in thickness. There is advanced atherosclerotic
calcification of the thoracic aorta. No aneurysmal dilatation or
evidence of dissection. There is atherosclerotic calcification of
the origins of the great vessels of the aortic arch. Evaluation of
the pulmonary arteries is very limited due to suboptimal
opacification and streak artifact. No large embolus identified in
the main pulmonary artery.

Mediastinum/Nodes: Mildly enlarged bilateral hilar lymph nodes. No
large mediastinal fluid collection.

Lungs/Pleura: There are clusters of nodularity involving the lung
bases, left greater right most likely representing an infectious
process and developing infiltrate. No lobar consolidation, pleural
effusion, or pneumothorax. The central airways are patent.

Musculoskeletal: Mild diffuse osteosclerosis most consistent with
renal osteodystrophy.

Review of the MIP images confirms the above findings.

CTA ABDOMEN AND PELVIS FINDINGS

VASCULAR

Aorta: Advanced atherosclerotic disease. No aneurysmal dilatation or
evidence of dissection.

Celiac: There is atherosclerotic calcification of the celiac artery.
The visualized portion of the celiac artery appears patent.

SMA: Advanced atherosclerotic calcification of the SMA. The SMA
remains patent.

Renals: Advanced atherosclerotic calcification of the renal artery
is. There is diminished flow in the renal arteries.

IMA: Poorly visualized.

Inflow: Advanced atherosclerotic calcification. The iliac arteries
appear patent.

Veins: Poorly visualized.  No portal venous gas.

Review of the MIP images confirms the above findings.

NON-VASCULAR

No intra-abdominal free air.  Small ascites.

Hepatobiliary: 30 limited evaluation of the liver. The liver appears
enlarged measuring 18 cm in midclavicular length. The gallbladder is
not well visualized.

Pancreas: Not well visualized.

Spleen: Poorly visualized grossly unremarkable.

Adrenals/Urinary Tract: The adrenal glands are not well visualized.
Bilateral renal atrophy and renovascular calcification. The urinary
bladder is predominantly collapsed.

Stomach/Bowel: There is redundancy of the sigmoid colon. There is
moderate stool in the distal colon as well as in the rectal vault
which may represent a degree of fecal impaction. Clinical
correlation is recommended. Evaluation of the bowel is very limited
in the absence of oral contrast and secondary to ascites and
anasarca. No dilated small bowel loop identified. A faint and poorly
visualized tubular structure medial to the cecum may represent a
normal appendix.

Lymphatic: Evaluation of the lymph nodes is very limited.

Reproductive: The prostate and seminal vesicles are poorly
visualized.

Other: Diffuse edema and anasarca.

Musculoskeletal: Osteosclerosis in keeping with renal
osteodystrophy. No acute osseous pathology.

Review of the MIP images confirms the above findings.
IMPRESSION: 1. Very limited study due to streak artifact caused by patient's
arms and anasarca. No aortic aneurysm or dissection.
2. Moderate Aortic Atherosclerosis ([id number]).
3. Bibasilar clusters of nodularity concerning for pneumonia.
Clinical correlation is recommended.
4. Moderate cardiomegaly and multi vessel coronary vascular
calcification and findings of right heart dysfunction correlation
with clinical exam and echocardiogram recommended. Pericardial
effusion measuring 14 mm in thickness.
5. Osteosclerosis in keeping with renal osteodystrophy.
6. Small ascites and anasarca.

## 2019-01-07 IMAGING — DX DG CHEST 1V PORT
1 series · 1 of 1 positions shown · non-contrast
Comparison: June 18, 2017

CLINICAL DATA: Dyspnea.  Cocaine abuse

EXAM:
PORTABLE CHEST 1 VIEW

[chest ap]
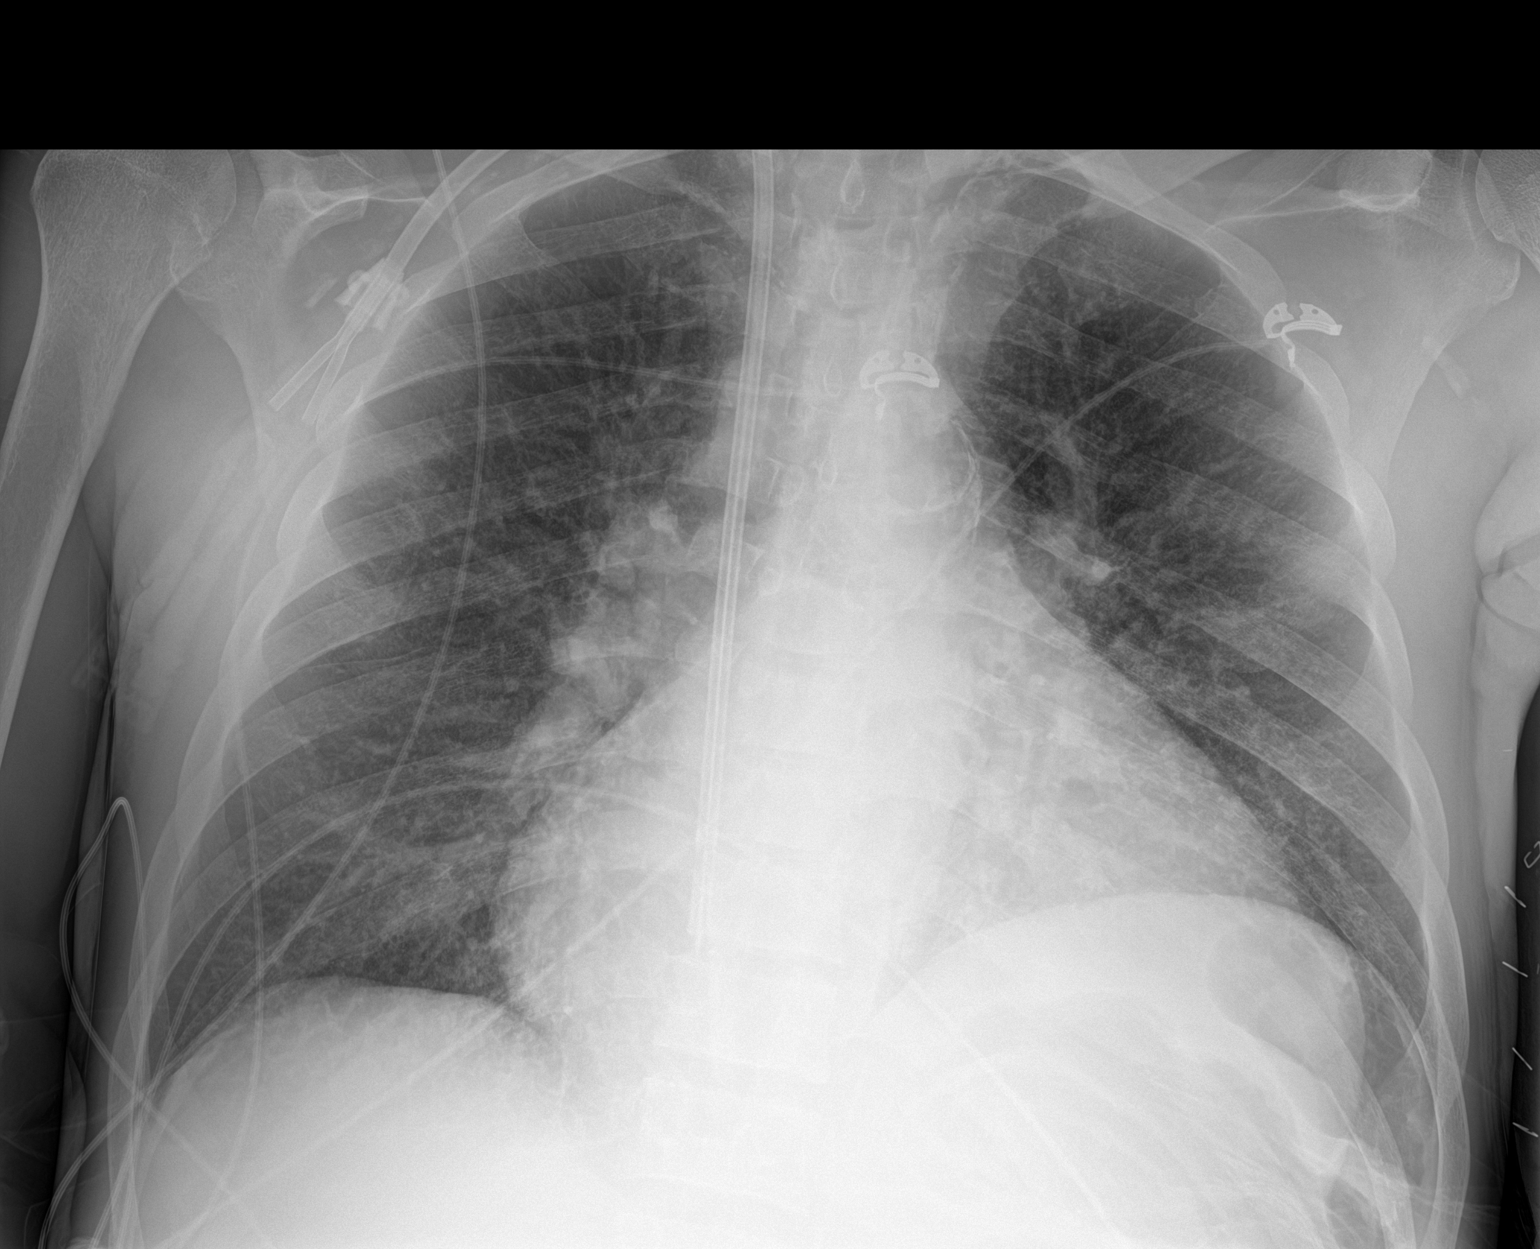

[1 of 1 positions shown; findings below may reference images not displayed]

FINDINGS: The heart size and mediastinal contours are stable. The heart size
is enlarged. Dual lumen central venous line is identified unchanged.
Mild increased pulmonary interstitium is identified bilaterally.
There is no focal pneumonia or pleural effusion. The visualized
skeletal structures are unremarkable.
IMPRESSION: Cardiomegaly.  Mild interstitial edema.

## 2019-01-07 IMAGING — DX DG CHEST 1V PORT
1 series · 1 of 1 positions shown · non-contrast
Comparison: 07/08/2017

CLINICAL DATA: Verify endotracheal tube

EXAM:
PORTABLE CHEST 1 VIEW

[chest]
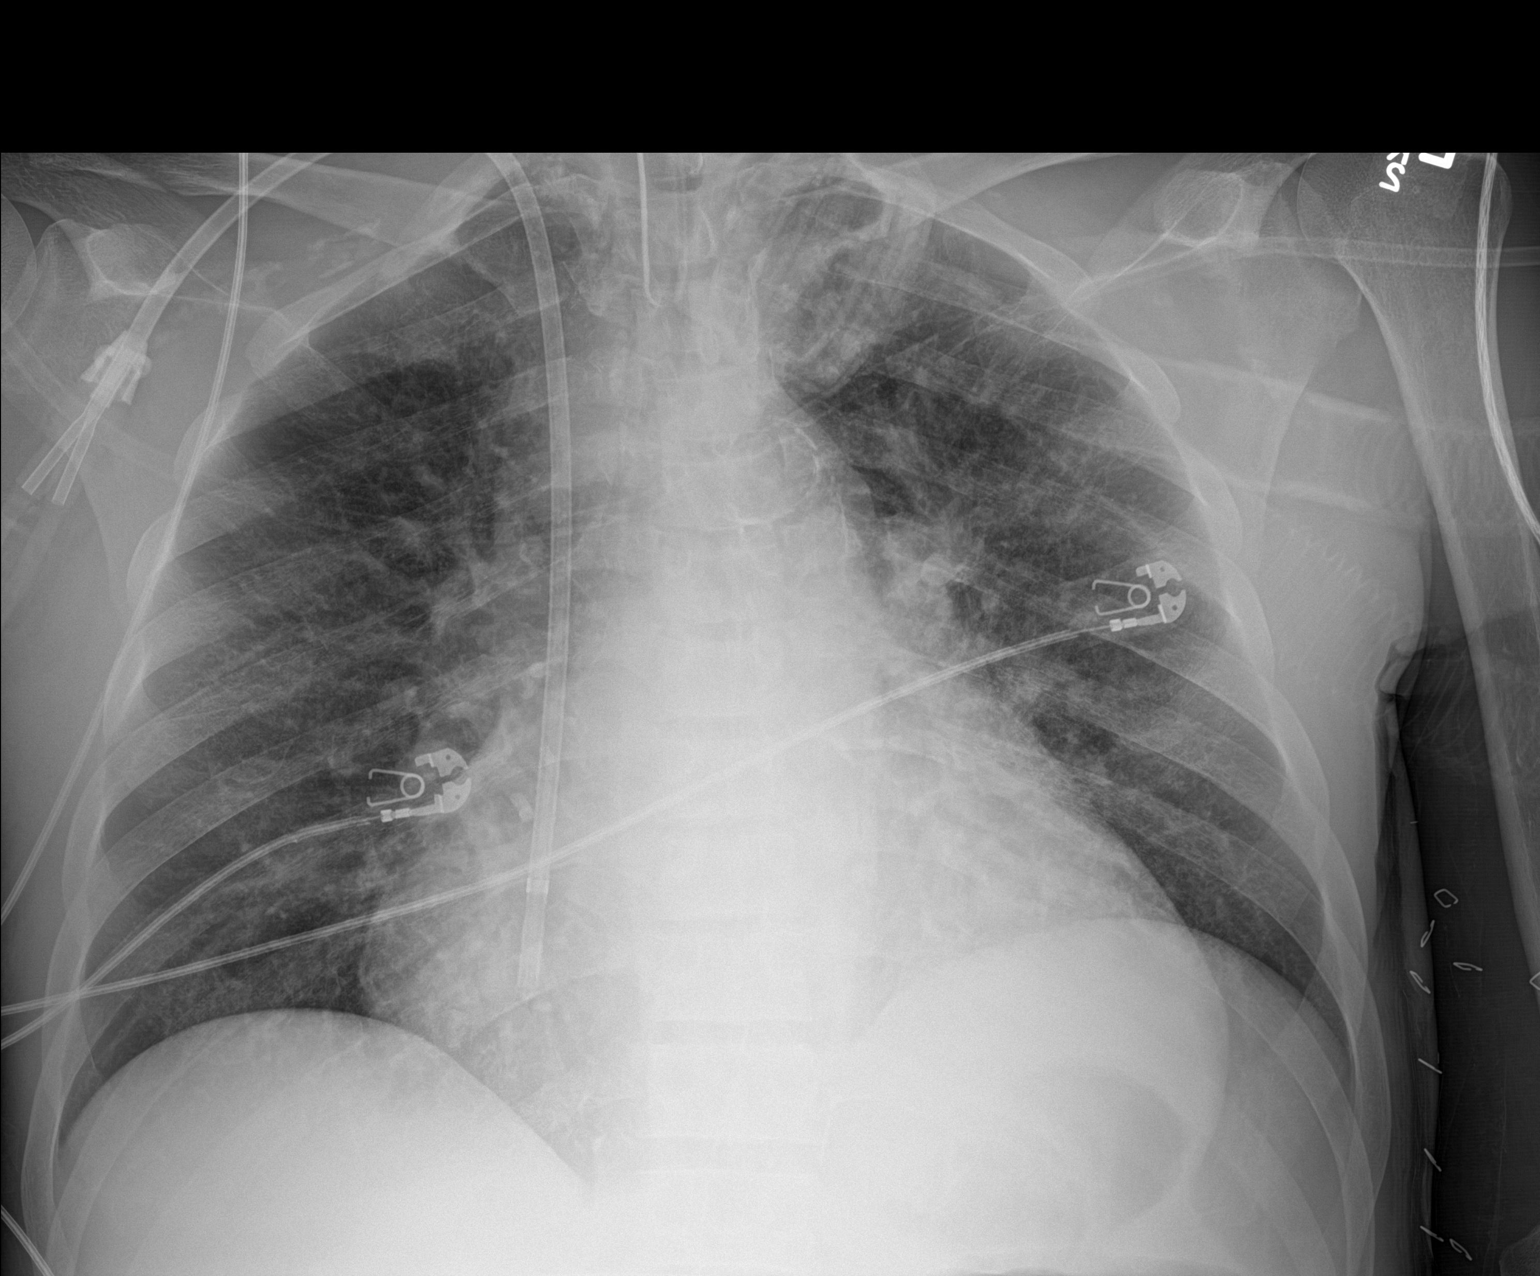

[1 of 1 positions shown; findings below may reference images not displayed]

FINDINGS: Endotracheal tube tip is about 5.4 cm superior to the carina.
Right-sided central venous catheter tip overlies the right atrium.
Cardiomegaly with mild central congestion. Aortic atherosclerosis.
No pneumothorax.
IMPRESSION: 1. Endotracheal tube tip about 5.4 cm superior to the carina
2. Cardiomegaly with vascular congestion

## 2019-01-08 ENCOUNTER — Other Ambulatory Visit (HOSPITAL_COMMUNITY): Payer: Self-pay | Admitting: Nephrology

## 2019-01-08 ENCOUNTER — Other Ambulatory Visit: Payer: Self-pay | Admitting: Nephrology

## 2019-01-08 ENCOUNTER — Other Ambulatory Visit: Payer: Self-pay

## 2019-01-08 ENCOUNTER — Encounter (HOSPITAL_COMMUNITY): Payer: Self-pay | Admitting: Student

## 2019-01-08 ENCOUNTER — Ambulatory Visit (HOSPITAL_COMMUNITY)
Admission: RE | Admit: 2019-01-08 | Discharge: 2019-01-08 | Disposition: A | Discharge status: Home / Self Care | Payer: Medicare Other | Source: Ambulatory Visit | Attending: Internal Medicine | Admitting: Internal Medicine

## 2019-01-08 DIAGNOSIS — K7031 Alcoholic cirrhosis of liver with ascites: Secondary | ICD-10-CM | POA: Insufficient documentation

## 2019-01-08 DIAGNOSIS — Z23 Encounter for immunization: Secondary | ICD-10-CM | POA: Diagnosis not present

## 2019-01-08 DIAGNOSIS — R8569 Abnormal cytological findings in specimens from other digestive organs and abdominal cavity: Secondary | ICD-10-CM | POA: Diagnosis not present

## 2019-01-08 DIAGNOSIS — N186 End stage renal disease: Secondary | ICD-10-CM | POA: Diagnosis not present

## 2019-01-08 DIAGNOSIS — N2581 Secondary hyperparathyroidism of renal origin: Secondary | ICD-10-CM | POA: Diagnosis not present

## 2019-01-08 DIAGNOSIS — R188 Other ascites: Secondary | ICD-10-CM | POA: Diagnosis not present

## 2019-01-08 DIAGNOSIS — D631 Anemia in chronic kidney disease: Secondary | ICD-10-CM | POA: Diagnosis not present

## 2019-01-08 DIAGNOSIS — D509 Iron deficiency anemia, unspecified: Secondary | ICD-10-CM | POA: Diagnosis not present

## 2019-01-08 HISTORY — PX: IR PARACENTESIS: IMG2679

## 2019-01-08 LAB — BODY FLUID CELL COUNT WITH DIFFERENTIAL
Eos, Fluid: 0 %
Lymphs, Fluid: 42 %
Monocyte-Macrophage-Serous Fluid: 57 % (ref 50–90)
Neutrophil Count, Fluid: 1 % (ref 0–25)
Total Nucleated Cell Count, Fluid: 175 cu mm (ref 0–1000)

## 2019-01-08 MED ORDER — ALBUMIN HUMAN 25 % IV SOLN
50.0000 g | Freq: Once | INTRAVENOUS | Status: AC
  Administered 2019-01-08: 50 g via INTRAVENOUS

## 2019-01-08 MED ORDER — LIDOCAINE HCL 1 % IJ SOLN
INTRAMUSCULAR | Status: AC
  Filled 2019-01-08: qty 20

## 2019-01-08 MED ORDER — ALBUMIN HUMAN 25 % IV SOLN
INTRAVENOUS | Status: AC
  Filled 2019-01-08: qty 150

## 2019-01-08 MED ORDER — ALBUMIN HUMAN 25 % IV SOLN
INTRAVENOUS | Status: AC
  Administered 2019-01-08: 50 g via INTRAVENOUS
  Filled 2019-01-08: qty 50

## 2019-01-08 MED ORDER — LIDOCAINE HCL (PF) 1 % IJ SOLN
INTRAMUSCULAR | Status: AC | PRN
  Administered 2019-01-08 (×2): 10 mL

## 2019-01-08 NOTE — Procedures (Signed)
PROCEDURE SUMMARY:  Successful image-guided paracentesis from the right lateral abdomen.  Yielded 5.7 liters of hazy gold fluid.  No immediate complications.  EBL = 0 mL. Patient tolerated well.   Specimen was sent for labs.  Claris Pong Guyla Bless PA-C 01/08/2019 1:23 PM

## 2019-01-09 LAB — PATHOLOGIST SMEAR REVIEW: Path Review: 6302020

## 2019-01-09 IMAGING — DX DG ABDOMEN 1V
1 series · 1 of 1 positions shown · non-contrast
Comparison: 04/23/2017

CLINICAL DATA: Abdominal distention.

EXAM:
ABDOMEN - 1 VIEW

[abdomen kub]
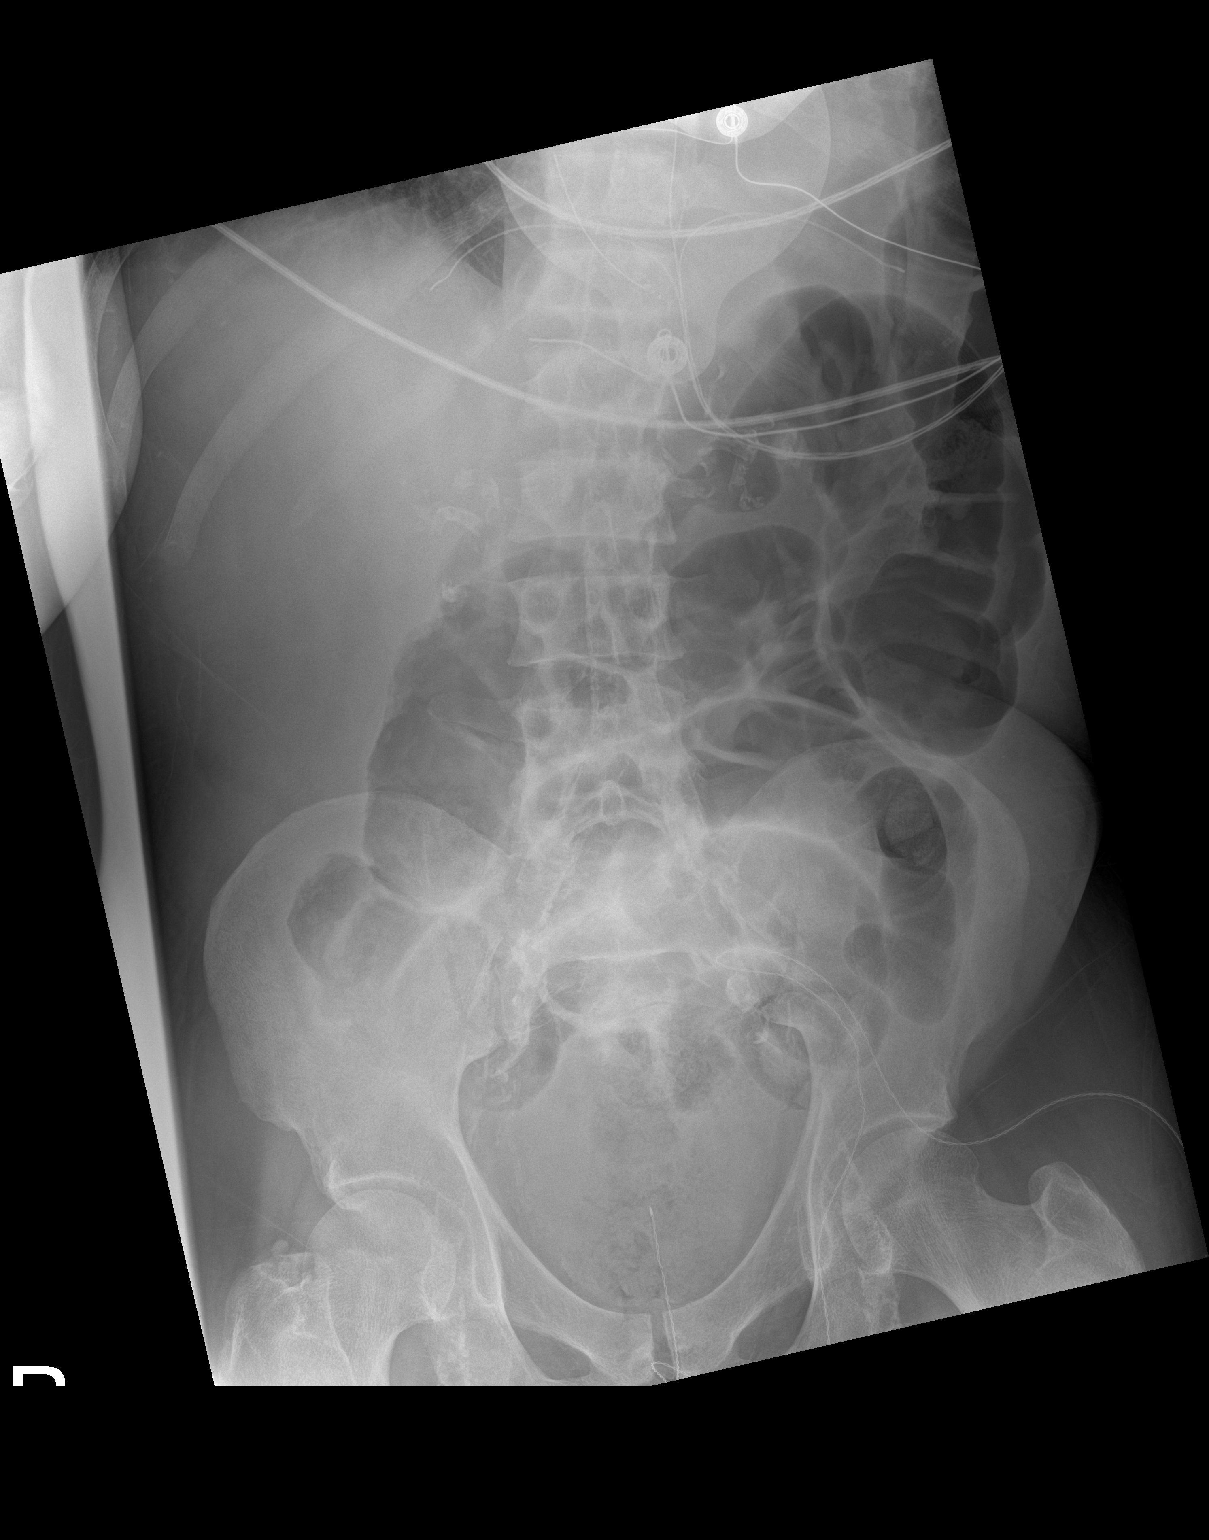

[1 of 1 positions shown; findings below may reference images not displayed]

FINDINGS: There is gaseous distension of the colon. Moderate stool burden
identified within the rectum. No abnormal small bowel dilatation.
IMPRESSION: 1. Gaseous distension of the colon with moderate stool burden in the
rectum. Correlate for any clinical signs or symptoms of rectal
impaction or constipation.

## 2019-01-09 IMAGING — DX DG CHEST 1V PORT
1 series · 1 of 1 positions shown · non-contrast
Comparison: Chest x-ray 07/10/2017, 07/08/2017, CT 07/08/2017

CLINICAL DATA: 56-year-old male with a history of intubation

EXAM:
PORTABLE CHEST 1 VIEW

[chest ap]
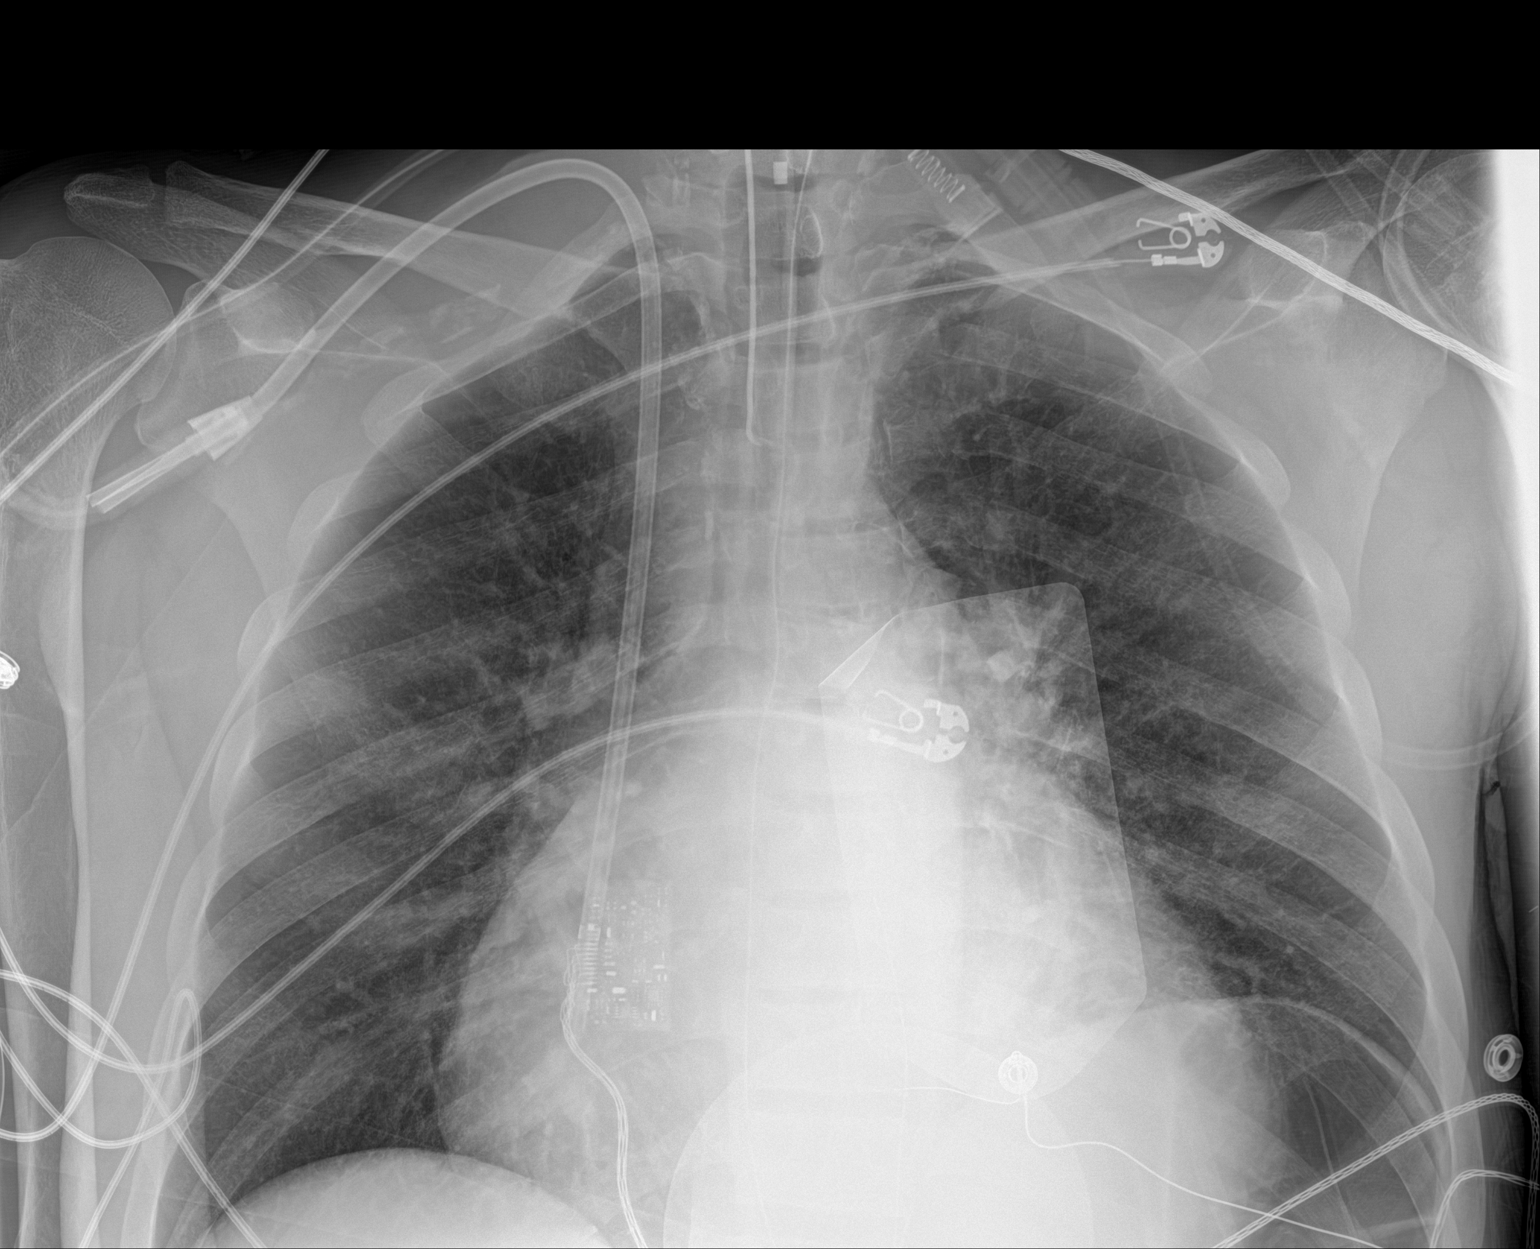

[1 of 1 positions shown; findings below may reference images not displayed]

FINDINGS: Cardiomediastinal silhouette unchanged with cardiomegaly. No
enlarged central vasculature.

No interlobular septal thickening.

No pneumothorax.  No confluent airspace disease.

Unchanged position of endotracheal tube, terminating approximately
6.3 cm above the carina.

Unchanged gastric tube terminating out of the field of view. Side
port terminates in the distal esophagus.

Unchanged position of right IJ hemodialysis catheter, terminating at
the superior cavoatrial junction.

Defibrillator pads project over the chest.
IMPRESSION: Unchanged appearance of the chest x-ray, with no evidence of edema
or confluent airspace disease.

Cardiomegaly.

Unchanged endotracheal tube, right IJ hemodialysis catheter,
defibrillator pads.

Gastric tube projects over the mediastinum terminating out of the
field of view, with the side-port in the distal esophagus.

## 2019-01-09 IMAGING — DX DG CHEST 1V PORT
1 series · 1 of 1 positions shown · non-contrast
Comparison: 07/08/2017

CLINICAL DATA: Respiratory failure

EXAM:
PORTABLE CHEST 1 VIEW

[chest ap]
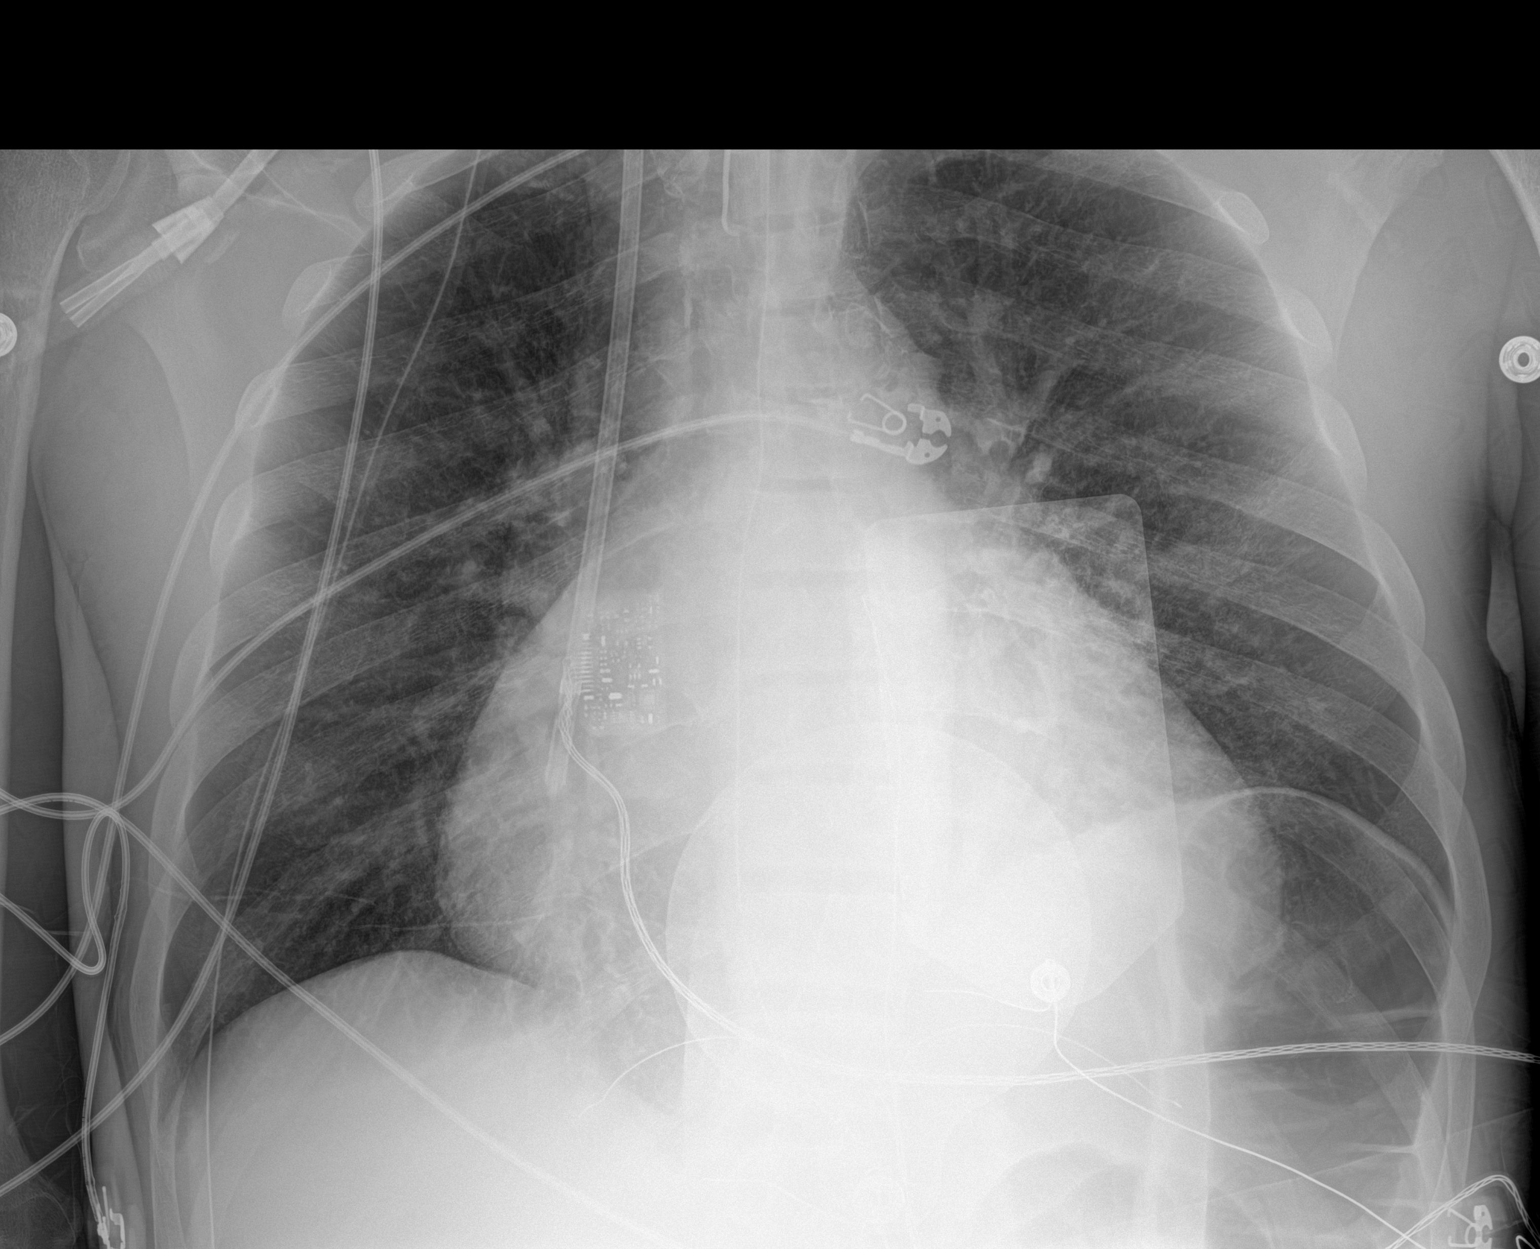

[1 of 1 positions shown; findings below may reference images not displayed]

FINDINGS: Endotracheal tube stable. NG tube placed. The tip is at the
gastroesophageal junction. The heart remains enlarged with a
globular appearance. Right jugular dialysis catheter remains in
place with its tip at the cavoatrial junction. Vascular congestion
has improved. Clear lungs. No pneumothorax.
IMPRESSION: Improved vascular congestion.

NG tube placed with its tip at the gastroesophageal junction. This
should be advanced.

## 2019-01-10 DIAGNOSIS — D631 Anemia in chronic kidney disease: Secondary | ICD-10-CM | POA: Diagnosis not present

## 2019-01-10 DIAGNOSIS — D509 Iron deficiency anemia, unspecified: Secondary | ICD-10-CM | POA: Diagnosis not present

## 2019-01-10 DIAGNOSIS — I120 Hypertensive chronic kidney disease with stage 5 chronic kidney disease or end stage renal disease: Secondary | ICD-10-CM | POA: Diagnosis not present

## 2019-01-10 DIAGNOSIS — Z992 Dependence on renal dialysis: Secondary | ICD-10-CM | POA: Diagnosis not present

## 2019-01-10 DIAGNOSIS — N186 End stage renal disease: Secondary | ICD-10-CM | POA: Diagnosis not present

## 2019-01-10 DIAGNOSIS — N2581 Secondary hyperparathyroidism of renal origin: Secondary | ICD-10-CM | POA: Diagnosis not present

## 2019-01-11 IMAGING — DX DG CHEST 1V PORT
1 series · 1 of 1 positions shown · non-contrast
Comparison: 07/10/2017 chest radiograph.

CLINICAL DATA: Acute respiratory failure

EXAM:
PORTABLE CHEST 1 VIEW

[chest]
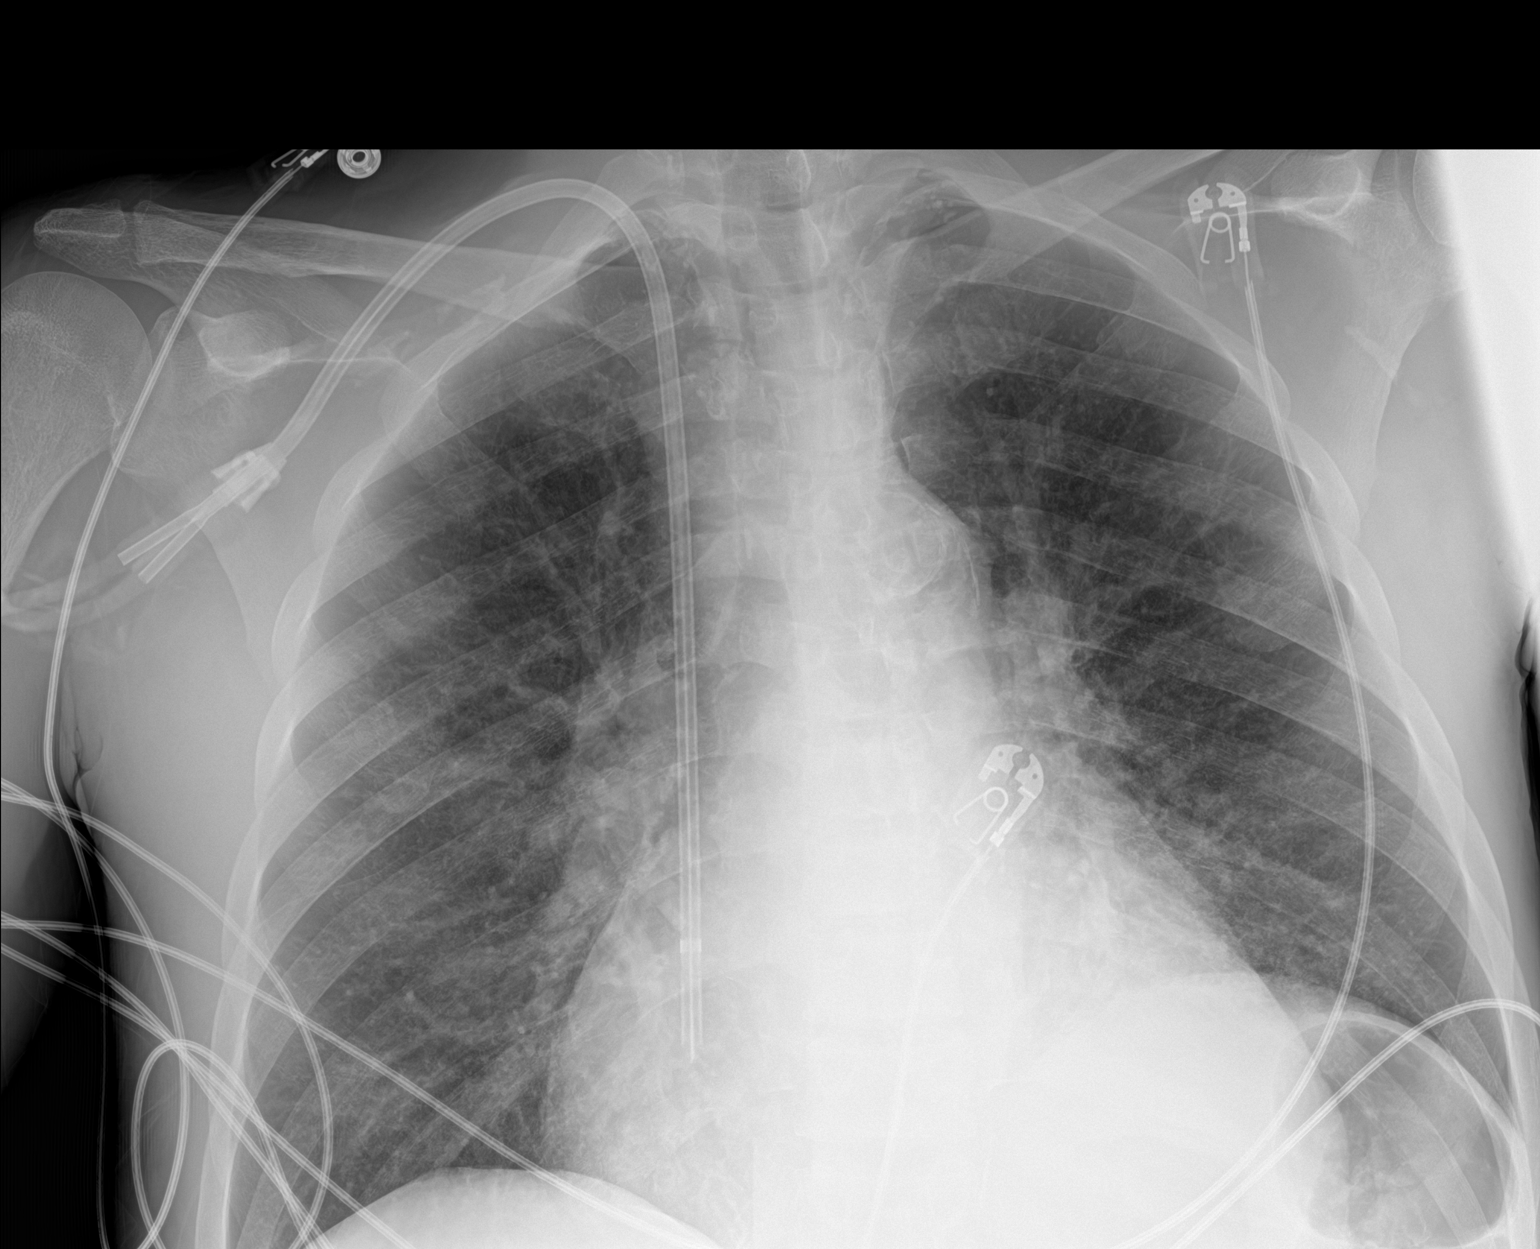

[1 of 1 positions shown; findings below may reference images not displayed]

FINDINGS: Right internal jugular central venous catheter terminates over the
right atrium. Stable cardiomediastinal silhouette with mild
cardiomegaly and aortic atherosclerosis. No pneumothorax. No pleural
effusion. No overt pulmonary edema. Stable mild patchy left
retrocardiac lung opacity. No new consolidative airspace disease.
IMPRESSION: Stable chest radiograph. Mild cardiomegaly without overt pulmonary
edema. Stable left retrocardiac patchy opacity, favor atelectasis.

## 2019-01-12 DIAGNOSIS — D631 Anemia in chronic kidney disease: Secondary | ICD-10-CM | POA: Diagnosis not present

## 2019-01-12 DIAGNOSIS — N2581 Secondary hyperparathyroidism of renal origin: Secondary | ICD-10-CM | POA: Diagnosis not present

## 2019-01-12 DIAGNOSIS — D509 Iron deficiency anemia, unspecified: Secondary | ICD-10-CM | POA: Diagnosis not present

## 2019-01-12 DIAGNOSIS — N186 End stage renal disease: Secondary | ICD-10-CM | POA: Diagnosis not present

## 2019-01-12 DIAGNOSIS — I120 Hypertensive chronic kidney disease with stage 5 chronic kidney disease or end stage renal disease: Secondary | ICD-10-CM | POA: Diagnosis not present

## 2019-01-13 IMAGING — DX DG CHEST 1V PORT
1 series · 1 of 1 positions shown · non-contrast
Comparison: Chest x-ray of July 12, 2017

CLINICAL DATA: RSV infection. Cough, shortness of breath. History
of COPD, end-stage renal disease, smoker

EXAM:
PORTABLE CHEST 1 VIEW

[chest ap]
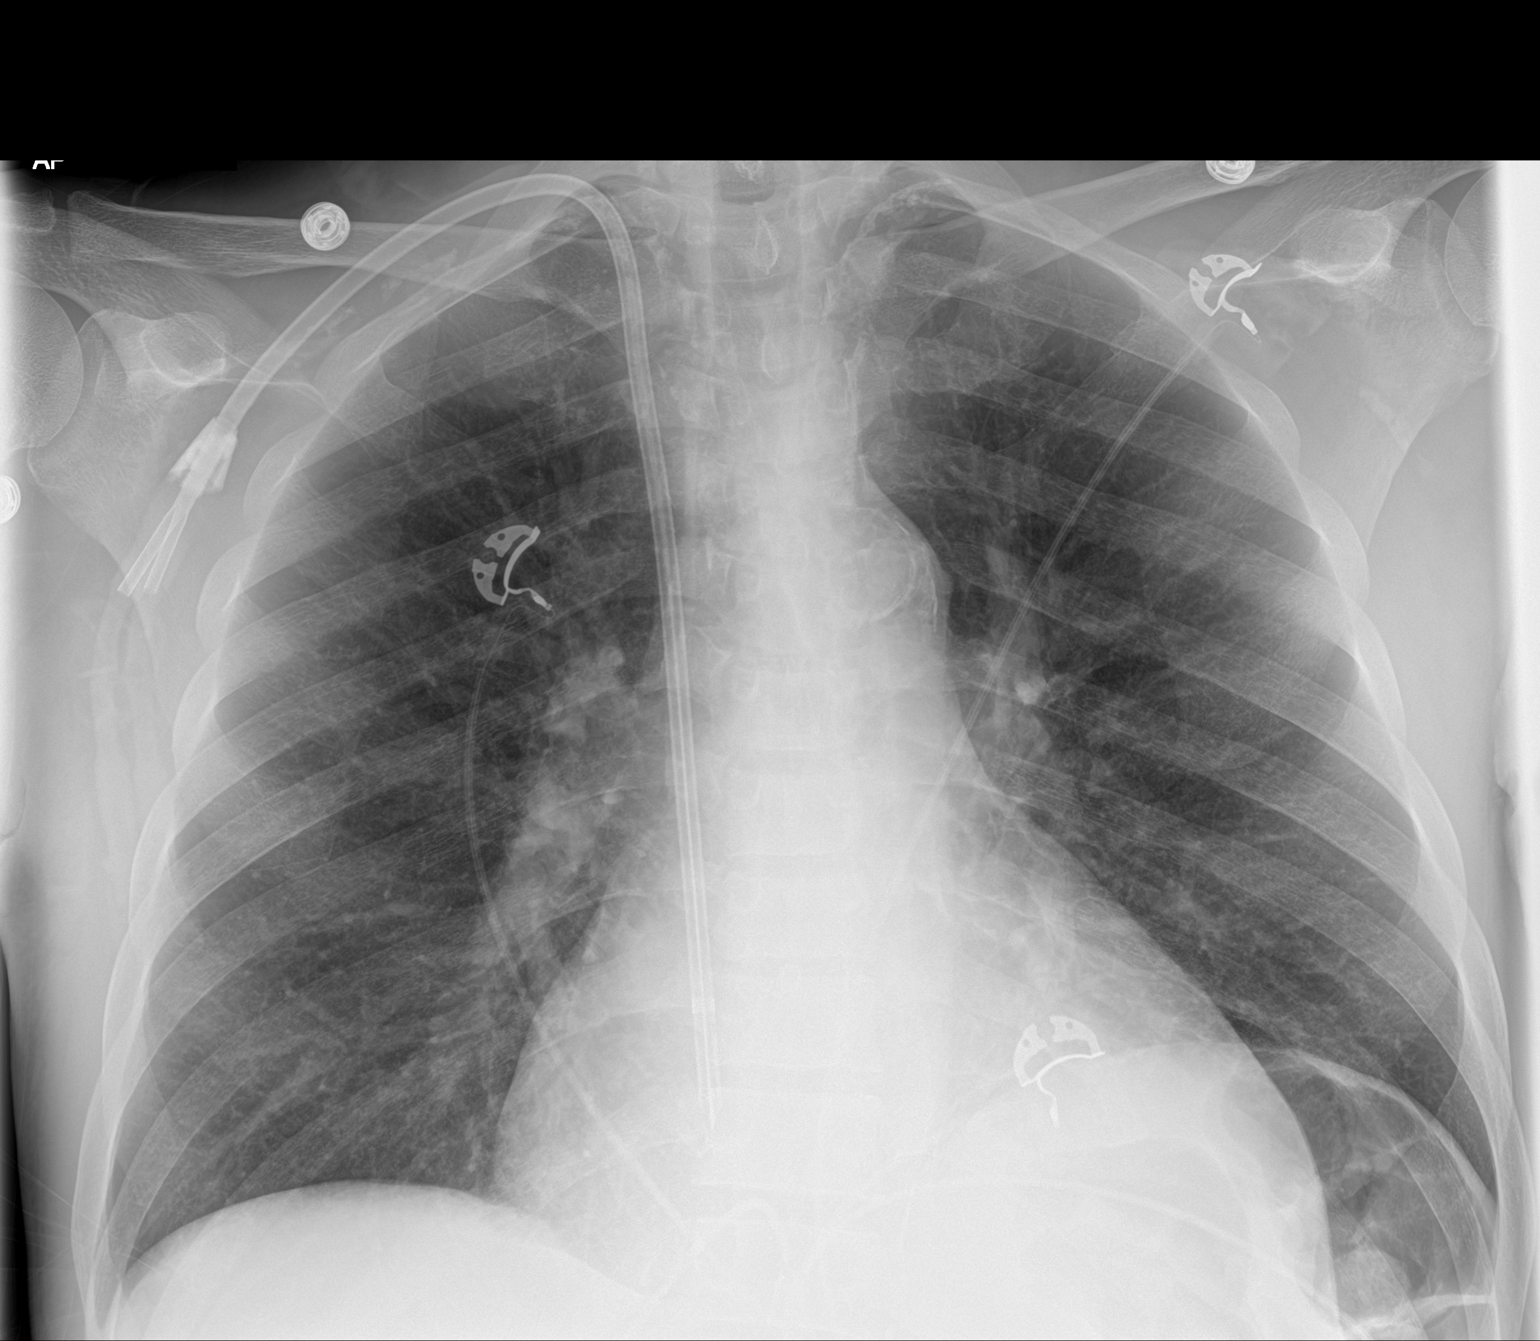

[1 of 1 positions shown; findings below may reference images not displayed]

FINDINGS: The lungs are adequately inflated. There is persistent increased
density in the retrocardiac region on the left. The cardiac
silhouette is enlarged. The central pulmonary vascularity is mildly
prominent. There is mild overall interstitial prominence which is
stable. There is calcification in the wall of the aortic arch. The
dialysis catheter tip projects at the cavoatrial junction.
IMPRESSION: COPD. Persistent left lower lobe atelectasis or pneumonia. Stable
cardiomegaly with minimal pulmonary vascular congestion.

## 2019-01-14 IMAGING — CT CT CTA ABD/PEL W/CM AND/OR W/O CM
2 of 9 series · 10 of 46 positions shown, 15 images · IV contrast (isovue)
Comparison: 04/22/2017

CLINICAL DATA: GI bleeding starting this morning.

EXAM:
CTA ABDOMEN AND PELVIS wITHOUT AND WITH CONTRAST
TECHNIQUE: Multidetector CT imaging of the abdomen and pelvis was performed
using the standard protocol during bolus administration of
intravenous contrast. Multiplanar reconstructed images and MIPs were
obtained and reviewed to evaluate the vascular anatomy.
CONTRAST:  100 mL Isovue 370

[Series 7: coronals · coronal · 0.73mm/px · 2 of 132 slices shown]
[im 44/132  soft-tissue]
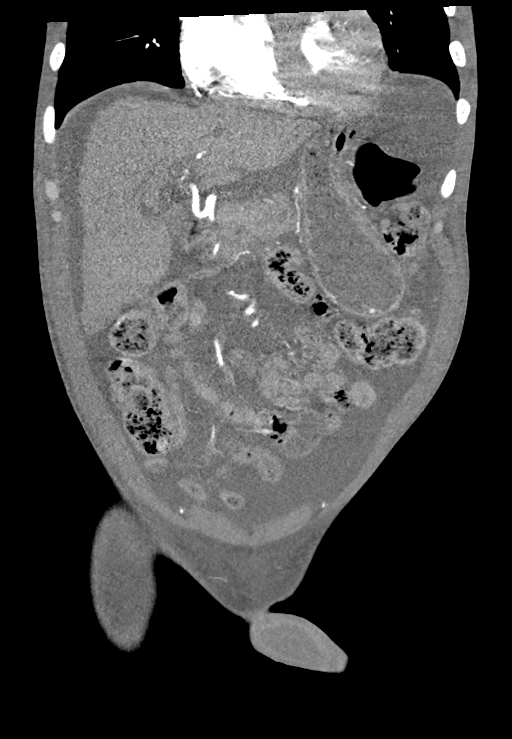
[im 88/132  soft-tissue]
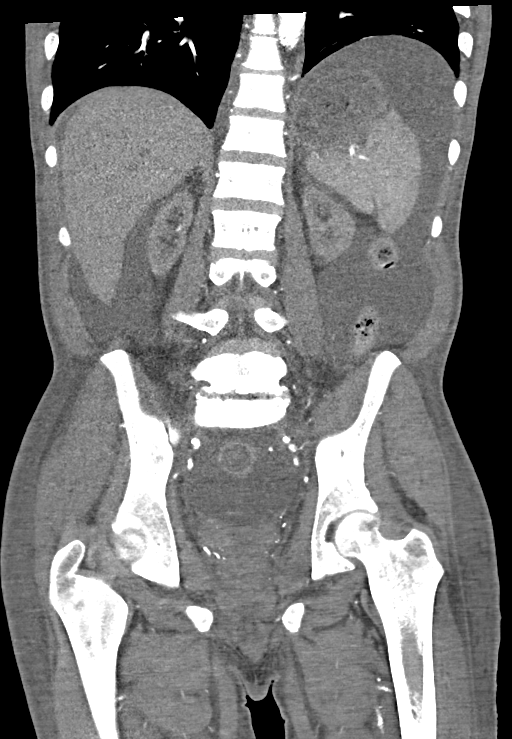

[Series 11: venous 5.0 i30f 1 · axial · portal-venous · 0.77mm/px · z∈[+771,+1201]mm · 8 of 108 slices shown, 13 images]
[im 11/108  soft-tissue]
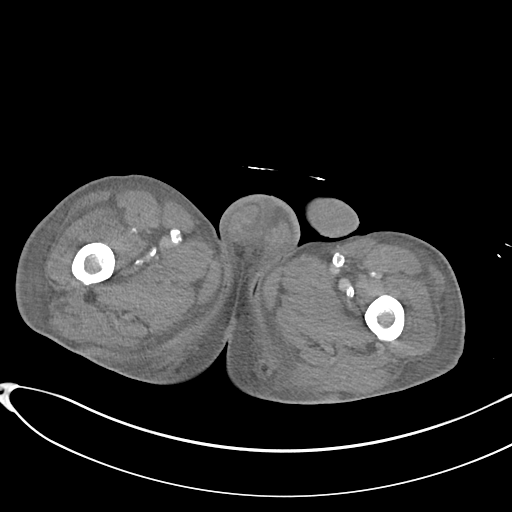
[im 11/108  bone]
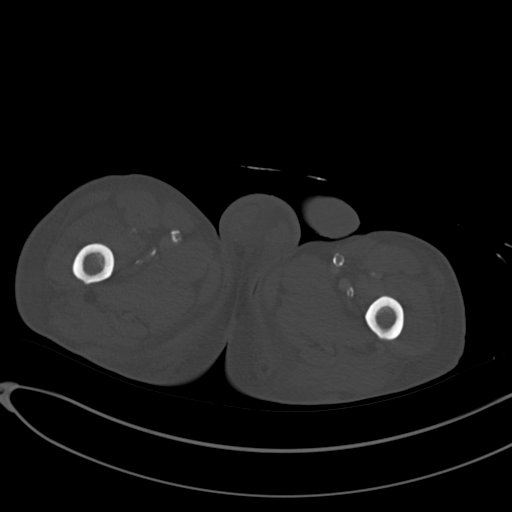
[im 22/108  soft-tissue]
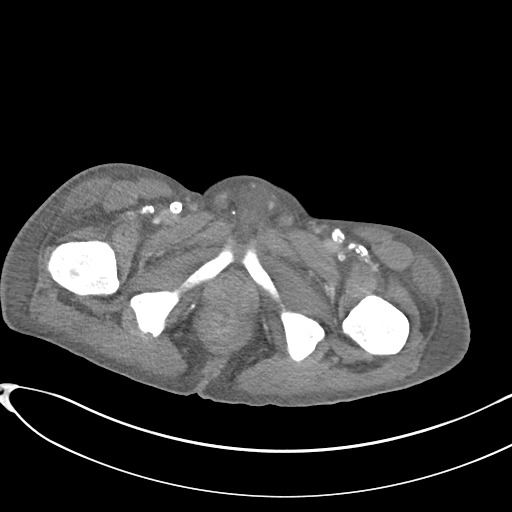
[im 33/108  soft-tissue]
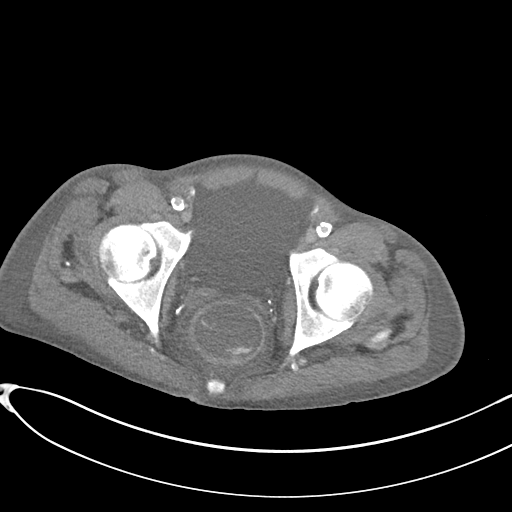
[im 43/108  soft-tissue]
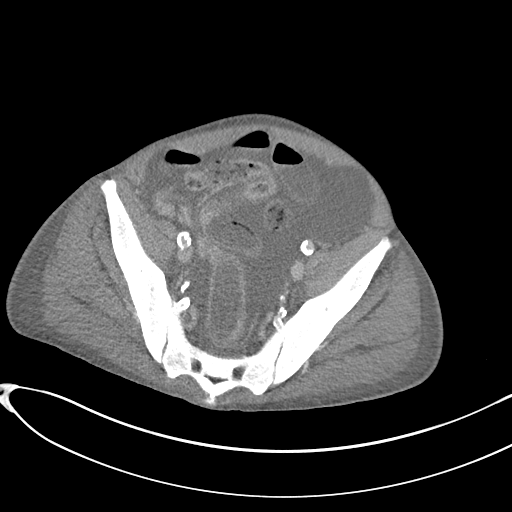
[im 65/108  soft-tissue]
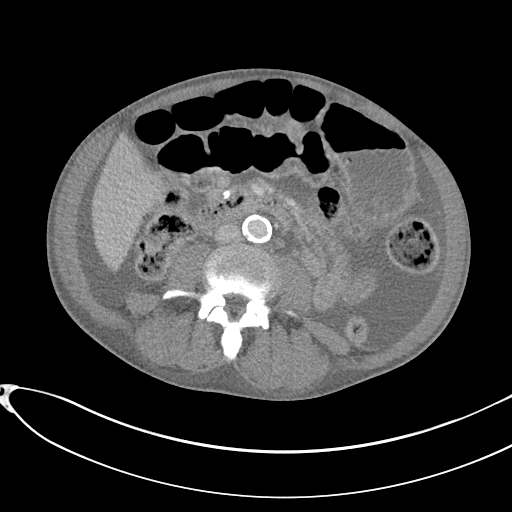
[im 65/108  lung]
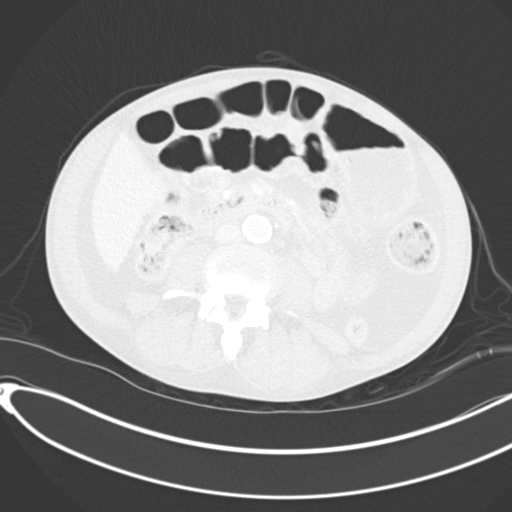
[im 75/108  soft-tissue]
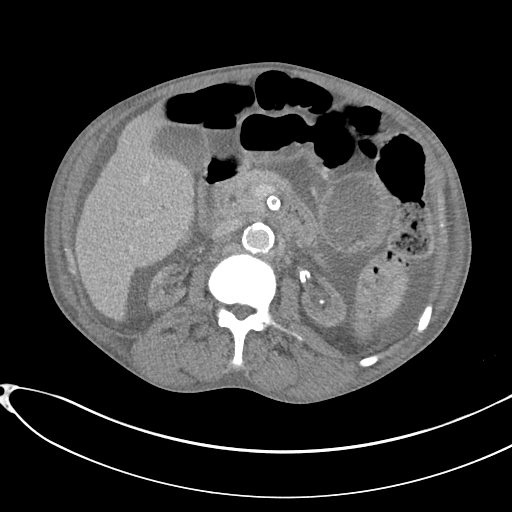
[im 75/108  lung]
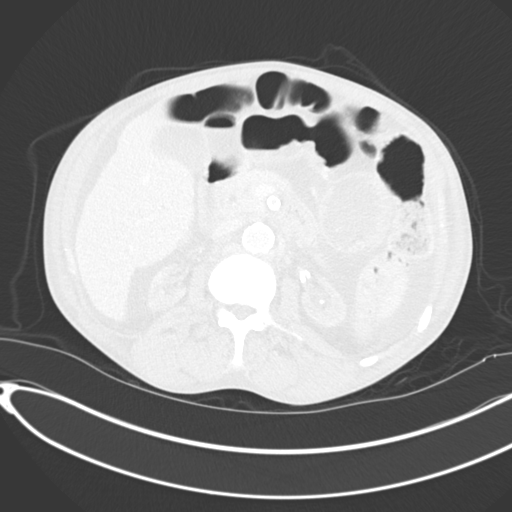
[im 86/108  soft-tissue]
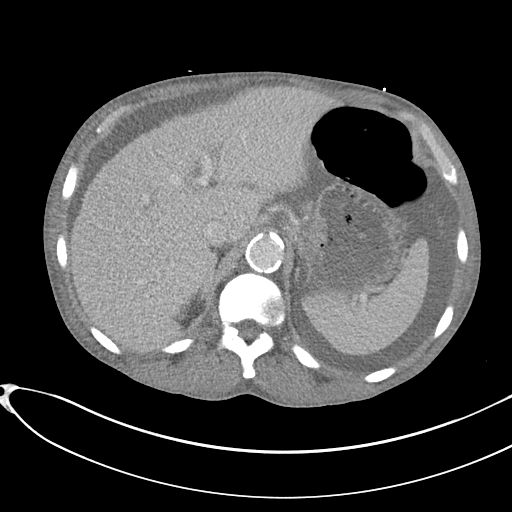
[im 86/108  lung]
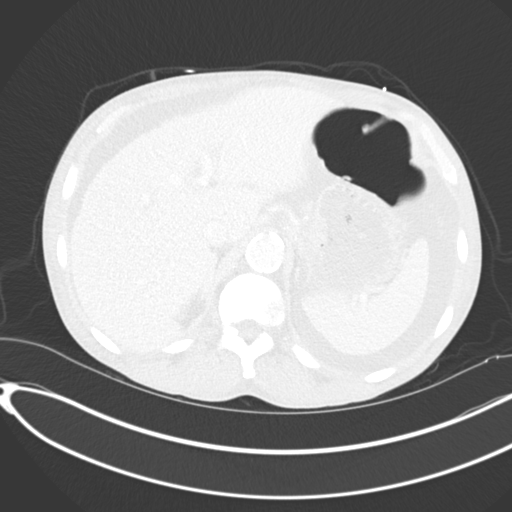
[im 97/108  soft-tissue]
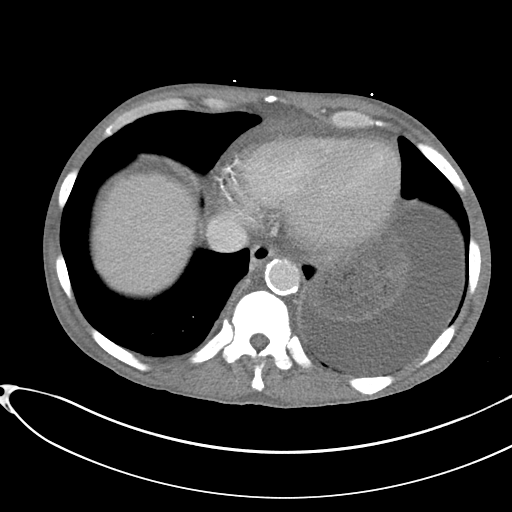
[im 97/108  lung]
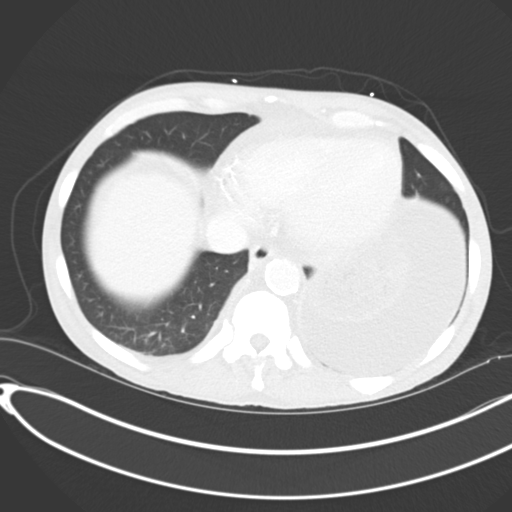

[10 of 46 positions shown; findings below may reference images not displayed]

FINDINGS: VASCULAR

Aorta: Prominent diffuse aortic calcification. No aneurysm or
obstruction. No dissection.

Celiac: Prominent calcification throughout the celiac axis. Celiac
vessels remain patent. No definite stenosis.

SMA: Diffuse calcification throughout the superior mesenteric
artery. The vessel remains patent although there is evidence of at
least moderate stenosis at the origin and just distal to the origin.

Renals: Single renal arteries bilaterally demonstrates severe
calcification. Changes highly likely to represent severe renal
artery stenosis bilaterally. Diffuse bilateral renal atrophy.

IMA: Inferior mesenteric artery is diminutive but appears patent.

Inflow: Vessels remain patent with diffuse calcification. Probably
least moderate stenosis is present.

Proximal Outflow: Vessels remain patent with diffuse calcification.
Probably areas of at least moderate stenosis are present.

Veins: No obvious venous abnormality within the limitations of this
arterial phase study. Portal veins and mesenteric veins appear
patent without obvious thrombosis.

Review of the MIP images confirms the above findings.

NON-VASCULAR

Lower chest: Small pericardial effusion.  Lung bases are clear.

Hepatobiliary: No focal liver abnormality is seen. No gallstones,
gallbladder wall thickening, or biliary dilatation.

Pancreas: Unremarkable. No pancreatic ductal dilatation or
surrounding inflammatory changes.

Spleen: Normal in size without focal abnormality.

Adrenals/Urinary Tract: No adrenal gland nodules. Both kidneys are
severely atrophic with probably delayed nephrograms. No
hydronephrosis or hydroureter. Bladder is decompressed.

Stomach/Bowel: Stomach, small bowel, and colon are not abnormally
distended. Scattered stool throughout the colon. No specific wall
thickening. Calcification in the wall of the stomach. Focal
increased density in the stomach likely to represent ingested
medication. This is stable on the arterial and portal venous phases.

The portal venous phase images demonstrate interval development of
increased attenuation material within the rectum extending from the
rectosigmoid junction into the low rectal region. This is consistent
with active contrast extravasation due to active bleeding. Source
appears to be at the level of the low rectosigmoid junction. No
associated mass or wall thickening is appreciated.

Lymphatic: No significant lymphadenopathy is identified.

Reproductive: Prostate is unremarkable.

Other: Severe diffuse free fluid throughout the abdomen and pelvis.
Density measurements are consistent with ascites. No free air in the
abdomen. Abdominal wall musculature appears intact.

Musculoskeletal: Diffuse bone sclerosis may be due to renal
osteodystrophy. No focal bone lesions or destruction identified.
Degenerative changes mostly at the lumbosacral junction.
IMPRESSION: VASCULAR

Prominent diffuse atherosclerotic calcifications throughout the
abdominal aorta and all major branch vessels. No aneurysm or
dissection is identified. There are likely areas of at least
moderate stenosis demonstrated in the proximal superior mesenteric
artery and in the iliac, external iliac, and femoral arteries
bilaterally. There is evidence of bilateral renal artery stenosis
with severe bilateral renal atrophy and delayed nephrograms.

NON-VASCULAR

1. Contrast extravasation consistent with active bleeding
demonstrated in the rectum. No associated mass or wall thickening is
appreciated.

2. Prominent diffuse abdominal and pelvic ascites. Small pericardial
effusion.

3. Diffuse bone sclerosis likely representing renal osteodystrophy.

These results were called by telephone at the time of interpretation
on 07/15/2017 at [DATE] to Dr. WINTER LOUDERMILK , who verbally
acknowledged these results.

## 2019-01-15 DIAGNOSIS — N2581 Secondary hyperparathyroidism of renal origin: Secondary | ICD-10-CM | POA: Diagnosis not present

## 2019-01-15 DIAGNOSIS — N186 End stage renal disease: Secondary | ICD-10-CM | POA: Diagnosis not present

## 2019-01-15 DIAGNOSIS — D631 Anemia in chronic kidney disease: Secondary | ICD-10-CM | POA: Diagnosis not present

## 2019-01-15 DIAGNOSIS — I120 Hypertensive chronic kidney disease with stage 5 chronic kidney disease or end stage renal disease: Secondary | ICD-10-CM | POA: Diagnosis not present

## 2019-01-15 DIAGNOSIS — D509 Iron deficiency anemia, unspecified: Secondary | ICD-10-CM | POA: Diagnosis not present

## 2019-01-17 DIAGNOSIS — N186 End stage renal disease: Secondary | ICD-10-CM | POA: Diagnosis not present

## 2019-01-17 DIAGNOSIS — D509 Iron deficiency anemia, unspecified: Secondary | ICD-10-CM | POA: Diagnosis not present

## 2019-01-17 DIAGNOSIS — D631 Anemia in chronic kidney disease: Secondary | ICD-10-CM | POA: Diagnosis not present

## 2019-01-17 DIAGNOSIS — N2581 Secondary hyperparathyroidism of renal origin: Secondary | ICD-10-CM | POA: Diagnosis not present

## 2019-01-17 DIAGNOSIS — I120 Hypertensive chronic kidney disease with stage 5 chronic kidney disease or end stage renal disease: Secondary | ICD-10-CM | POA: Diagnosis not present

## 2019-01-18 ENCOUNTER — Telehealth: Payer: Self-pay | Admitting: Physician Assistant

## 2019-01-18 NOTE — Telephone Encounter (Signed)
The patient was seen by [name] and he is scheduled for follow up with you on 02/06/19. Do you want to discuss it then?

## 2019-01-18 NOTE — Telephone Encounter (Signed)
Pt is requesting a referral for to see a liver specialist in [place], he wants to check if he can have a liver transplant. He did not know the name if the practice, thinks it is part of [facility].

## 2019-01-19 ENCOUNTER — Other Ambulatory Visit: Payer: Self-pay

## 2019-01-19 ENCOUNTER — Telehealth: Payer: Self-pay | Admitting: Internal Medicine

## 2019-01-19 DIAGNOSIS — D509 Iron deficiency anemia, unspecified: Secondary | ICD-10-CM | POA: Diagnosis not present

## 2019-01-19 DIAGNOSIS — I120 Hypertensive chronic kidney disease with stage 5 chronic kidney disease or end stage renal disease: Secondary | ICD-10-CM | POA: Diagnosis not present

## 2019-01-19 DIAGNOSIS — N2581 Secondary hyperparathyroidism of renal origin: Secondary | ICD-10-CM | POA: Diagnosis not present

## 2019-01-19 DIAGNOSIS — K7460 Unspecified cirrhosis of liver: Secondary | ICD-10-CM

## 2019-01-19 DIAGNOSIS — N186 End stage renal disease: Secondary | ICD-10-CM | POA: Diagnosis not present

## 2019-01-19 DIAGNOSIS — D631 Anemia in chronic kidney disease: Secondary | ICD-10-CM | POA: Diagnosis not present

## 2019-01-19 DIAGNOSIS — R188 Other ascites: Secondary | ICD-10-CM

## 2019-01-19 NOTE — Telephone Encounter (Signed)
Please arrange a therapeutic paracentesis in radiology for next week with.  Albumin 50 grams IV with the procedure.  Maximum 7 liters fluid removal. Cell count with fluid.

## 2019-01-19 NOTE — Telephone Encounter (Signed)
Pt requested his antibiotics refilled and to reschedule a paracentesis.

## 2019-01-19 NOTE — Telephone Encounter (Signed)
Pt scheduled for IR para at [facility] 01/23/19@11am , pt to arrive at 10:45am. Pt aware of appt. Discussed with pt that there are refills on his antibioitic script and he just needs to contact the pharmacy for the refill. Pt aware.

## 2019-01-19 NOTE — Telephone Encounter (Signed)
Pyrtle pt with alcoholic cirrhosis calling.Spoke with pt and he states he is uncomfortable, abdomen swollen again. His last IR para was done 01/08/19 and 5.7 liters were drawn off. Pt is requesting another para. As DOD please advise.

## 2019-01-22 DIAGNOSIS — D631 Anemia in chronic kidney disease: Secondary | ICD-10-CM | POA: Diagnosis not present

## 2019-01-22 DIAGNOSIS — N2581 Secondary hyperparathyroidism of renal origin: Secondary | ICD-10-CM | POA: Diagnosis not present

## 2019-01-22 DIAGNOSIS — N186 End stage renal disease: Secondary | ICD-10-CM | POA: Diagnosis not present

## 2019-01-22 DIAGNOSIS — I120 Hypertensive chronic kidney disease with stage 5 chronic kidney disease or end stage renal disease: Secondary | ICD-10-CM | POA: Diagnosis not present

## 2019-01-22 DIAGNOSIS — D509 Iron deficiency anemia, unspecified: Secondary | ICD-10-CM | POA: Diagnosis not present

## 2019-01-22 NOTE — Telephone Encounter (Signed)
Ok to start the ball rolling on hepatologist referral for consideration of OLTx  [facility] referral to hepatology is ok, Dr. Damita Lack

## 2019-01-22 NOTE — Telephone Encounter (Signed)
Referral form for [facility] with recent labs, imaging and office note from last 2 office visits, insurance information and demographics faxed to [phone].

## 2019-01-23 ENCOUNTER — Ambulatory Visit (HOSPITAL_COMMUNITY)
Admission: RE | Admit: 2019-01-23 | Discharge: 2019-01-23 | Disposition: A | Discharge status: Home / Self Care | Payer: Medicare Other | Source: Ambulatory Visit | Attending: Gastroenterology | Admitting: Gastroenterology

## 2019-01-23 ENCOUNTER — Telehealth: Payer: Self-pay | Admitting: Adult Health Nurse Practitioner

## 2019-01-23 ENCOUNTER — Encounter (HOSPITAL_COMMUNITY): Payer: Self-pay | Admitting: Student

## 2019-01-23 ENCOUNTER — Other Ambulatory Visit: Payer: Self-pay

## 2019-01-23 DIAGNOSIS — K7031 Alcoholic cirrhosis of liver with ascites: Secondary | ICD-10-CM | POA: Diagnosis not present

## 2019-01-23 DIAGNOSIS — K7460 Unspecified cirrhosis of liver: Secondary | ICD-10-CM | POA: Diagnosis not present

## 2019-01-23 DIAGNOSIS — R188 Other ascites: Secondary | ICD-10-CM | POA: Insufficient documentation

## 2019-01-23 DIAGNOSIS — R8569 Abnormal cytological findings in specimens from other digestive organs and abdominal cavity: Secondary | ICD-10-CM | POA: Diagnosis not present

## 2019-01-23 HISTORY — PX: IR PARACENTESIS: IMG2679

## 2019-01-23 LAB — BODY FLUID CELL COUNT WITH DIFFERENTIAL
Eos, Fluid: 0 %
Lymphs, Fluid: 9 %
Monocyte-Macrophage-Serous Fluid: 88 % (ref 50–90)
Neutrophil Count, Fluid: 3 % (ref 0–25)
Total Nucleated Cell Count, Fluid: 268 cu mm (ref 0–1000)

## 2019-01-23 MED ORDER — LIDOCAINE HCL 1 % IJ SOLN
INTRAMUSCULAR | Status: AC
  Filled 2019-01-23: qty 20

## 2019-01-23 MED ORDER — LIDOCAINE HCL (PF) 1 % IJ SOLN
INTRAMUSCULAR | Status: DC | PRN
  Administered 2019-01-23: 10 mL

## 2019-01-23 MED ORDER — ALBUMIN HUMAN 25 % IV SOLN
50.0000 g | Freq: Once | INTRAVENOUS | Status: AC
  Administered 2019-01-23: 11:00:00 50 g via INTRAVENOUS

## 2019-01-23 MED ORDER — ALBUMIN HUMAN 25 % IV SOLN
INTRAVENOUS | Status: AC
  Filled 2019-01-23: qty 200

## 2019-01-23 NOTE — Procedures (Signed)
PROCEDURE SUMMARY:  Successful image-guided paracentesis from the right lateral abdomen.  Yielded 5.4 liters of hazy gold fluid.  No immediate complications.  EBL = 0 mL. Patient tolerated well.   Specimen was sent for labs.  Claris Pong Ziere Docken PA-C 01/23/2019 12:20 PM

## 2019-01-23 NOTE — Telephone Encounter (Signed)
Spoke with patient and after explaining Palliative services patient was in agreement to with starting Palliative.  We  scheduled a Telephone Palliative consult for 01/24/19 @ 2PM.

## 2019-01-24 ENCOUNTER — Other Ambulatory Visit: Payer: Medicare Other | Admitting: Adult Health Nurse Practitioner

## 2019-01-24 DIAGNOSIS — Z515 Encounter for palliative care: Secondary | ICD-10-CM

## 2019-01-24 LAB — PATHOLOGIST SMEAR REVIEW

## 2019-01-24 NOTE — Progress Notes (Signed)
[facility] Consult Note Telephone: [phone]  Fax: [phone]  PATIENT NAME: [name] DOB: 05/05/1961 MRN: [id number]  PRIMARY CARE PROVIDER:   [name], FNP  REFERRING PROVIDER:  [name] [place],  [place] [address]  RESPONSIBLE PARTY:   Self [phone]  Due to the COVID-19 crisis, this visit was done via telemedicine and it was initiated and consent by this patient and or family. Video-audio (telehealth) contact was unable to be done due to technical barriers from the patient's side.       RECOMMENDATIONS and PLAN:  1.  ESRD on HD.  Patient has HD on M,W,F.  He has had numerous ER visits or hospital stays due to missed dialysis.  Did have bleeding in AV fistula to right upper arm in March which has resolved.  He states that he does not remember if any procedure was done to stop the bleeding.  Encouraged patient not to miss dialysis.  2.  Cirrhosis of the liver due to alcohol abuse.  Patient is on lactulose but states that he does not take it everyday because it makes him nauseous.  Stressed the importance of taking it everyday.  Patient gets ascites and requires paracentesis.  He is unsure how often.  States that he brought up putting in a stent that goes to his small intestines to help drain the fluid from his ascites.  When asked which doctor brought this up he stated he did not know and that it was him that brought up. Do not know if this is something that is feasible for this patient.  Did try to explain to him that this is a complication of the cirrhosis and that he would have to continue with the paracenteses.  Last paracentesis was yesterday  3.  Nutrition.  He states that he has lost weight but unsure how much.  He states he notices when he sees himself in the mirror.  Says that when he has fluid buildup in his abdomen that his cannot eat much. He eats well after his paracenteses.   Does not indicate a problem with getting food at home.  4.  Mobility.  States that he requires assistance with ADLs.  Especially with the ascites he is unable to bend over fully to put on his shoes or pick things up.  States that he has an aid that comes to his home daily to help with bills, cooking, housework, helps bathe him.  5.  Goals of care. Patient is a DNR from Epic documentation.  He wants to home for as long as possible.  Have appointment next week in person to discuss further.  I spent 30 minutes providing this consultation,  from 2:00 to 2:30. More than 50% of the time in this consultation was spent coordinating communication.   HISTORY OF PRESENT ILLNESS:  [name] is a 58 y.o. year old male with multiple medical problems including ESRD on HD, cirrhosis of the liver, Hep C, CAD. Palliative Care was asked to help address goals of care.   CODE STATUS: DNR  PPS: 0% HOSPICE ELIGIBILITY/DIAGNOSIS: TBD  PAST MEDICAL HISTORY:  Past Medical History:  Diagnosis Date  . Anemia   . Anxiety   . Arthritis    left shoulder  . Atherosclerosis of aorta ([place])   . Cardiomegaly   . Chest pain    DATE UNKNOWN, C/O PERIODICALLY  . Cocaine abuse ([place])   .  COPD exacerbation ([place]) 08/17/2016  . Coronary artery disease    stent 02/22/17  . ESRD (end stage renal disease) on dialysis ([place])    "[name]; MWF" (07/04/2017)  . GERD (gastroesophageal reflux disease)    DATE UNKNOWN  . Hemorrhoids   . Hepatitis B, chronic ([place])   . Hepatitis C   . History of kidney stones   . Hyperkalemia   . Hypertension   . Kidney failure   . Metabolic bone disease    Patient denies  . Mitral stenosis   . Myocardial infarction ([place])   . Pneumonia   . Pulmonary edema   . Solitary rectal ulcer syndrome 07/2017   at [facility] for rectal bleeding  . Tubular adenoma of colon     SOCIAL HX:  Social History   Tobacco Use  . Smoking status: Current Every Day Smoker    Packs/day: 0.50     Years: 43.00    Pack years: 21.50    Types: Cigarettes    Start date: 08/13/1973  . Smokeless tobacco: Never Used  Substance Use Topics  . Alcohol use: Not Currently    Frequency: Never    Comment: quit drinking in 2017    ALLERGIES:  Allergies  Allergen Reactions  . Morphine And Related Other (See Comments)    Stomach pain  . Aspirin Other (See Comments)    STOMACH PAIN  . Clonidine Derivatives Itching  . Tramadol Itching  . Tylenol [Acetaminophen] Nausea Only    Stomach ache     PERTINENT MEDICATIONS:  Outpatient Encounter Medications as of 01/24/2019  Medication Sig  . albuterol (VENTOLIN HFA) 108 (90 Base) MCG/ACT inhaler Inhale 2 puffs into the lungs every 6 (six) hours as needed for wheezing or shortness of breath.  . cephALEXin (KEFLEX) 500 MG capsule Take 500 mg by mouth 2 (two) times a day.  . docusate sodium ([employer] STOOL SOFTENER) 250 MG capsule Take 250 mg by mouth daily.  Marland Kitchen lactulose (CHRONULAC) 10 GM/15ML solution Take 45 mLs (30 g total) by mouth 2 (two) times daily.  . metoprolol tartrate (LOPRESSOR) 25 MG tablet Take 1 tablet (25 mg total) by mouth 2 (two) times daily.  . montelukast (SINGULAIR) 10 MG tablet Take 10 mg by mouth daily.  . ondansetron (ZOFRAN) 4 MG tablet Take 4 mg by mouth every 8 (eight) hours as needed for nausea or vomiting.  . pantoprazole (PROTONIX) 40 MG tablet Take 1 tablet (40 mg total) by mouth See admin instructions. Take one tablet (40 mg) by mouth in the morning on Sunday, Tuesday, Thursday, Saturday, take one tablet (40 mg) after dialysis on Monday, Wednesday, Friday. May also take one tablet (40 mg) in the evening as needed for acid reflux.  . sevelamer carbonate (RENVELA) 800 MG tablet Take 4 tablets with meals  And 2 tablets twice daily with snacks (Patient not taking: Reported on 12/28/2018)  . sorbitol 70 % SOLN Take 30 mLs by mouth 3 (three) times daily as needed for constipation.  . sulfamethoxazole-trimethoprim (BACTRIM DS) 800-160 MG  tablet Take 1 tablet by mouth daily.  . SYMBICORT 160-4.5 MCG/ACT inhaler Inhale 2 puffs into the lungs 2 (two) times a day.  . zolpidem (AMBIEN) 5 MG tablet Take 5 mg by mouth at bedtime.   No facility-administered encounter medications on file as of 01/24/2019.       Concettina Leth Jenetta Downer, NP

## 2019-01-26 DIAGNOSIS — N2581 Secondary hyperparathyroidism of renal origin: Secondary | ICD-10-CM | POA: Diagnosis not present

## 2019-01-26 DIAGNOSIS — N186 End stage renal disease: Secondary | ICD-10-CM | POA: Diagnosis not present

## 2019-01-26 DIAGNOSIS — D509 Iron deficiency anemia, unspecified: Secondary | ICD-10-CM | POA: Diagnosis not present

## 2019-01-26 DIAGNOSIS — D631 Anemia in chronic kidney disease: Secondary | ICD-10-CM | POA: Diagnosis not present

## 2019-01-26 DIAGNOSIS — I120 Hypertensive chronic kidney disease with stage 5 chronic kidney disease or end stage renal disease: Secondary | ICD-10-CM | POA: Diagnosis not present

## 2019-01-29 DIAGNOSIS — I120 Hypertensive chronic kidney disease with stage 5 chronic kidney disease or end stage renal disease: Secondary | ICD-10-CM | POA: Diagnosis not present

## 2019-01-29 DIAGNOSIS — D509 Iron deficiency anemia, unspecified: Secondary | ICD-10-CM | POA: Diagnosis not present

## 2019-01-29 DIAGNOSIS — D631 Anemia in chronic kidney disease: Secondary | ICD-10-CM | POA: Diagnosis not present

## 2019-01-29 DIAGNOSIS — N186 End stage renal disease: Secondary | ICD-10-CM | POA: Diagnosis not present

## 2019-01-29 DIAGNOSIS — N2581 Secondary hyperparathyroidism of renal origin: Secondary | ICD-10-CM | POA: Diagnosis not present

## 2019-01-30 ENCOUNTER — Other Ambulatory Visit: Payer: Self-pay | Admitting: Physician Assistant

## 2019-01-31 DIAGNOSIS — I120 Hypertensive chronic kidney disease with stage 5 chronic kidney disease or end stage renal disease: Secondary | ICD-10-CM | POA: Diagnosis not present

## 2019-01-31 DIAGNOSIS — N2581 Secondary hyperparathyroidism of renal origin: Secondary | ICD-10-CM | POA: Diagnosis not present

## 2019-01-31 DIAGNOSIS — D509 Iron deficiency anemia, unspecified: Secondary | ICD-10-CM | POA: Diagnosis not present

## 2019-01-31 DIAGNOSIS — N186 End stage renal disease: Secondary | ICD-10-CM | POA: Diagnosis not present

## 2019-01-31 DIAGNOSIS — D631 Anemia in chronic kidney disease: Secondary | ICD-10-CM | POA: Diagnosis not present

## 2019-02-01 ENCOUNTER — Other Ambulatory Visit: Payer: Self-pay

## 2019-02-01 ENCOUNTER — Emergency Department (HOSPITAL_COMMUNITY)
Admission: EM | Admit: 2019-02-01 | Discharge: 2019-02-01 | Disposition: A | Discharge status: Home / Self Care | Payer: Medicare Other | Attending: Emergency Medicine | Admitting: Emergency Medicine

## 2019-02-01 ENCOUNTER — Emergency Department (HOSPITAL_COMMUNITY): Payer: Medicare Other

## 2019-02-01 ENCOUNTER — Encounter (HOSPITAL_COMMUNITY): Payer: Self-pay

## 2019-02-01 DIAGNOSIS — F1210 Cannabis abuse, uncomplicated: Secondary | ICD-10-CM | POA: Diagnosis not present

## 2019-02-01 DIAGNOSIS — F17210 Nicotine dependence, cigarettes, uncomplicated: Secondary | ICD-10-CM | POA: Diagnosis not present

## 2019-02-01 DIAGNOSIS — R1084 Generalized abdominal pain: Secondary | ICD-10-CM | POA: Diagnosis not present

## 2019-02-01 DIAGNOSIS — J449 Chronic obstructive pulmonary disease, unspecified: Secondary | ICD-10-CM | POA: Insufficient documentation

## 2019-02-01 DIAGNOSIS — N186 End stage renal disease: Secondary | ICD-10-CM | POA: Diagnosis not present

## 2019-02-01 DIAGNOSIS — Z79899 Other long term (current) drug therapy: Secondary | ICD-10-CM | POA: Insufficient documentation

## 2019-02-01 DIAGNOSIS — R609 Edema, unspecified: Secondary | ICD-10-CM | POA: Diagnosis not present

## 2019-02-01 DIAGNOSIS — Z992 Dependence on renal dialysis: Secondary | ICD-10-CM | POA: Diagnosis not present

## 2019-02-01 DIAGNOSIS — I120 Hypertensive chronic kidney disease with stage 5 chronic kidney disease or end stage renal disease: Secondary | ICD-10-CM | POA: Insufficient documentation

## 2019-02-01 DIAGNOSIS — I252 Old myocardial infarction: Secondary | ICD-10-CM | POA: Insufficient documentation

## 2019-02-01 DIAGNOSIS — Z209 Contact with and (suspected) exposure to unspecified communicable disease: Secondary | ICD-10-CM | POA: Diagnosis not present

## 2019-02-01 DIAGNOSIS — F1410 Cocaine abuse, uncomplicated: Secondary | ICD-10-CM | POA: Insufficient documentation

## 2019-02-01 DIAGNOSIS — R52 Pain, unspecified: Secondary | ICD-10-CM

## 2019-02-01 DIAGNOSIS — Z20828 Contact with and (suspected) exposure to other viral communicable diseases: Secondary | ICD-10-CM | POA: Insufficient documentation

## 2019-02-01 DIAGNOSIS — R0602 Shortness of breath: Secondary | ICD-10-CM | POA: Diagnosis not present

## 2019-02-01 DIAGNOSIS — R069 Unspecified abnormalities of breathing: Secondary | ICD-10-CM | POA: Diagnosis not present

## 2019-02-01 DIAGNOSIS — R109 Unspecified abdominal pain: Secondary | ICD-10-CM | POA: Diagnosis present

## 2019-02-01 DIAGNOSIS — K7031 Alcoholic cirrhosis of liver with ascites: Secondary | ICD-10-CM | POA: Diagnosis not present

## 2019-02-01 DIAGNOSIS — R188 Other ascites: Secondary | ICD-10-CM | POA: Diagnosis not present

## 2019-02-01 HISTORY — PX: IR PARACENTESIS: IMG2679

## 2019-02-01 LAB — CBC WITH DIFFERENTIAL/PLATELET
Abs Immature Granulocytes: 0.02 10*3/uL (ref 0.00–0.07)
Basophils Absolute: 0.1 10*3/uL (ref 0.0–0.1)
Basophils Relative: 1 %
Eosinophils Absolute: 0.1 10*3/uL (ref 0.0–0.5)
Eosinophils Relative: 2 %
HCT: 28.8 % — ABNORMAL LOW (ref 39.0–52.0)
Hemoglobin: 10.1 g/dL — ABNORMAL LOW (ref 13.0–17.0)
Immature Granulocytes: 0 %
Lymphocytes Relative: 18 %
Lymphs Abs: 1.2 10*3/uL (ref 0.7–4.0)
MCH: 29.3 pg (ref 26.0–34.0)
MCHC: 35.1 g/dL (ref 30.0–36.0)
MCV: 83.5 fL (ref 80.0–100.0)
Monocytes Absolute: 0.6 10*3/uL (ref 0.1–1.0)
Monocytes Relative: 9 %
Neutro Abs: 4.5 10*3/uL (ref 1.7–7.7)
Neutrophils Relative %: 70 %
Platelets: 128 10*3/uL — ABNORMAL LOW (ref 150–400)
RBC: 3.45 MIL/uL — ABNORMAL LOW (ref 4.22–5.81)
RDW: 16.2 % — ABNORMAL HIGH (ref 11.5–15.5)
WBC: 6.5 10*3/uL (ref 4.0–10.5)
nRBC: 0 % (ref 0.0–0.2)

## 2019-02-01 LAB — COMPREHENSIVE METABOLIC PANEL
ALT: 19 U/L (ref 0–44)
AST: 30 U/L (ref 15–41)
Albumin: 3.8 g/dL (ref 3.5–5.0)
Alkaline Phosphatase: 116 U/L (ref 38–126)
Anion gap: 13 (ref 5–15)
BUN: 14 mg/dL (ref 6–20)
CO2: 29 mmol/L (ref 22–32)
Calcium: 8.4 mg/dL — ABNORMAL LOW (ref 8.9–10.3)
Chloride: 94 mmol/L — ABNORMAL LOW (ref 98–111)
Creatinine, Ser: 5.25 mg/dL — ABNORMAL HIGH (ref 0.61–1.24)
GFR calc Af Amer: 13 mL/min — ABNORMAL LOW (ref >60)
GFR calc non Af Amer: 11 mL/min — ABNORMAL LOW (ref >60)
Glucose, Bld: 131 mg/dL — ABNORMAL HIGH (ref 70–99)
Potassium: 4.6 mmol/L (ref 3.5–5.1)
Sodium: 136 mmol/L (ref 135–145)
Total Bilirubin: 0.8 mg/dL (ref 0.3–1.2)
Total Protein: 6.7 g/dL (ref 6.5–8.1)

## 2019-02-01 LAB — SARS CORONAVIRUS 2 BY RT PCR (HOSPITAL ORDER, PERFORMED IN ~~LOC~~ HOSPITAL LAB): SARS Coronavirus 2: NEGATIVE

## 2019-02-01 MED ORDER — LIDOCAINE HCL 1 % IJ SOLN
INTRAMUSCULAR | Status: DC | PRN
  Administered 2019-02-01: 10 mL

## 2019-02-01 MED ORDER — LIDOCAINE HCL 1 % IJ SOLN
INTRAMUSCULAR | Status: AC
  Filled 2019-02-01: qty 20

## 2019-02-01 NOTE — Procedures (Signed)
   LLQ paracentesis  2.2 L yellow fluid  No labs per MD Tolerated well

## 2019-02-01 NOTE — ED Triage Notes (Signed)
Pt arrives with [facility] from home. Pt is a dialysis pt (MWF) with last treatment yesterday. Pt reports fluid in abdomen; hx of COPD, afib, ESRD. Pt states he doesn't make any urine.

## 2019-02-01 NOTE — ED Provider Notes (Addendum)
[facility] EMERGENCY DEPARTMENT Provider Note   CSN: [id number] Arrival date & time: 02/01/19  1025    History   Chief Complaint Chief Complaint  Patient presents with   fluid overload    dialysis pt    HPI [name] is a 58 y.o. male.     HPI  58 year old male with history of polysubstance abuse, COPD, ESRD on HD (M, W, F), liver cirrhosis with recurrent ascites comes in a chief complaint of abdominal discomfort.  Patient reports that he has been having worsening of the abdominal girth over the past few days leading to increase abdominal discomfort and shortness of breath with exertion.  He has been getting paracentesis done every couple of weeks for the last few days.  Patient states that he is asked the GI doctors to schedule a routine paracentesis for him, but they have not followed through on it.  Review of system is negative for any nausea, vomiting, fevers, chills  Past Medical History:  Diagnosis Date   Anemia    Anxiety    Arthritis    left shoulder   Atherosclerosis of aorta (HCC)    Cardiomegaly    Chest pain    DATE UNKNOWN, C/O PERIODICALLY   Cocaine abuse (HCC)    COPD exacerbation ([place]) 08/17/2016   Coronary artery disease    stent 02/22/17   ESRD (end stage renal disease) on dialysis ([place])    "[name]; MWF" (07/04/2017)   GERD (gastroesophageal reflux disease)    DATE UNKNOWN   Hemorrhoids    Hepatitis B, chronic (HCC)    Hepatitis C    History of kidney stones    Hyperkalemia    Hypertension    Kidney failure    Metabolic bone disease    Patient denies   Mitral stenosis    Myocardial infarction [facility])    Pneumonia    Pulmonary edema    Solitary rectal ulcer syndrome 07/2017   at flex sig for rectal bleeding   Tubular adenoma of colon     Patient Active Problem List   Diagnosis Date Noted   Volume overload 12/28/2018   Sepsis ([place]) 09/12/2018   Atherosclerosis of native  coronary artery of native heart without angina pectoris 03/11/2018   Benign neoplasm of cecum    Benign neoplasm of ascending colon    Benign neoplasm of descending colon    Benign neoplasm of rectum    Paroxysmal atrial fibrillation ([place]) 01/23/2018   Hx of colonic polyps 01/20/2018   ESRD (end stage renal disease) ([place]) 11/21/2017   GERD (gastroesophageal reflux disease) 11/16/2017   Liver cirrhosis ([place]) 11/15/2017   DNR (do not resuscitate)    Palliative care by specialist    Hyponatremia 11/04/2017   SBP (spontaneous bacterial peritonitis) ([place]) 10/30/2017   Liver disease, chronic 10/30/2017   SOB (shortness of breath)    Abdominal pain 10/28/2017   Upper airway cough syndrome with flattening on f/v loop 10/13/17 c/w vcd 10/17/2017   Elevated diaphragm 10/13/2017   Ileus ([place]) 09/29/2017   Malnutrition of moderate degree 09/29/2017   Sinus congestion 09/03/2017   Symptomatic anemia 09/02/2017   Other cirrhosis of liver ([place]) 09/02/2017   Left bundle branch block 09/02/2017   Mitral stenosis 09/02/2017   Hematochezia 07/15/2017   Wide-complex tachycardia ([place])    Endotracheally intubated    ESRD on dialysis ([place]) 07/04/2017   Acute respiratory failure with hypoxia ([place]) 06/18/2017   CKD (chronic kidney disease) stage V  requiring chronic dialysis ([place]) 06/18/2017   History of Cocaine abuse ([place]) 06/18/2017   Hypertension 06/18/2017   Infection of AV graft for dialysis ([place]) 06/18/2017   Anxiety 06/18/2017   Anemia due to chronic kidney disease 06/18/2017   Atrial flutter with rapid ventricular response ([place]) 06/18/2017   Personality disorder ([place]) 06/13/2017   Cellulitis 06/12/2017   Adjustment disorder with mixed anxiety and depressed mood 06/10/2017   Suicidal ideation 06/10/2017   Arm wound, left, sequela 06/10/2017   Dyspnea on exertion 05/29/2017   Tachycardia 05/29/2017   Hyperkalemia 56/81/2751   Acute metabolic  encephalopathy    Anemia 04/23/2017   Ascites 04/23/2017   COPD (chronic obstructive pulmonary disease) ([place]) 04/23/2017   Acute on chronic respiratory failure with hypoxia ([place]) 03/25/2017   Arrhythmia 03/25/2017   COPD GOLD 0 with flattening on inps f/v  09/27/2016   Essential hypertension 09/27/2016   Fluid overload 08/30/2016   COPD exacerbation ([place]) 08/17/2016   Hypertensive urgency 08/17/2016   Respiratory failure ([place]) 08/17/2016   Problem with dialysis access ([place]) 07/23/2016   Chronic hepatitis B ([place]) 03/05/2014   Chronic hepatitis C without hepatic coma ([place]) 03/05/2014   Internal hemorrhoids with bleeding, swelling and itching 03/05/2014   Thrombocytopenia ([place]) 03/05/2014   Chest pain 02/27/2014   Alcohol abuse 04/14/2009   Cigarette smoker 04/14/2009   GANGLION CYST 04/14/2009    Past Surgical History:  Procedure Laterality Date   A/V FISTULAGRAM Left 05/26/2017   Procedure: A/V FISTULAGRAM;  Surgeon: Conrad Garnet, MD;  Location: [place] [facility];  Service: Cardiovascular;  Laterality: Left;   A/V FISTULAGRAM Right 11/18/2017   Procedure: A/V FISTULAGRAM - Right Arm;  Surgeon: Elam Dutch, MD;  Location: [place] [facility];  Service: Cardiovascular;  Laterality: Right;   APPLICATION OF WOUND VAC Left 06/14/2017   Procedure: APPLICATION OF WOUND VAC;  Surgeon: Katha Cabal, MD;  Location: [facility] ORS;  Service: Vascular;  Laterality: Left;   AV FISTULA PLACEMENT  2012   BELIEVED WAS PLACED IN JUNE   AV FISTULA PLACEMENT Right 08/09/2017   Procedure: Creation Right arm ARTERIOVENOUS BRACHIOCEPOHALIC FISTULA;  Surgeon: Elam Dutch, MD;  Location: [facility] OR;  Service: Vascular;  Laterality: Right;   AV FISTULA PLACEMENT Right 11/22/2017   Procedure: INSERTION OF ARTERIOVENOUS (AV) GORE-TEX GRAFT RIGHT UPPER ARM;  Surgeon: Elam Dutch, MD;  Location: [place];  Service: Vascular;  Laterality: Right;   BIOPSY  01/25/2018    Procedure: BIOPSY;  Surgeon: Jerene Bears, MD;  Location: [place];  Service: Gastroenterology;;   COLONOSCOPY     COLONOSCOPY WITH PROPOFOL N/A 01/25/2018   Procedure: COLONOSCOPY WITH PROPOFOL;  Surgeon: Jerene Bears, MD;  Location: [place];  Service: Gastroenterology;  Laterality: N/A;   CORONARY STENT INTERVENTION N/A 02/22/2017   Procedure: CORONARY STENT INTERVENTION;  Surgeon: Nigel Mormon, MD;  Location: [place] [facility];  Service: Cardiovascular;  Laterality: N/A;   ESOPHAGOGASTRODUODENOSCOPY (EGD) WITH PROPOFOL N/A 01/25/2018   Procedure: ESOPHAGOGASTRODUODENOSCOPY (EGD) WITH PROPOFOL;  Surgeon: Jerene Bears, MD;  Location: [place];  Service: Gastroenterology;  Laterality: N/A;   FLEXIBLE SIGMOIDOSCOPY N/A 07/15/2017   Procedure: FLEXIBLE SIGMOIDOSCOPY;  Surgeon: Carol Ada, MD;  Location: [place];  Service: Endoscopy;  Laterality: N/A;   HEMORRHOID BANDING     I&D EXTREMITY Left 06/01/2017   Procedure: IRRIGATION AND DEBRIDEMENT LEFT ARM HEMATOMA WITH LIGATION OF LEFT ARM AV FISTULA;  Surgeon: Elam Dutch, MD;  Location: [facility]  OR;  Service: Vascular;  Laterality: Left;   I&D EXTREMITY Left 06/14/2017   Procedure: IRRIGATION AND DEBRIDEMENT EXTREMITY;  Surgeon: Katha Cabal, MD;  Location: [facility] ORS;  Service: Vascular;  Laterality: Left;   INSERTION OF DIALYSIS CATHETER  05/30/2017   INSERTION OF DIALYSIS CATHETER N/A 05/30/2017   Procedure: INSERTION OF DIALYSIS CATHETER;  Surgeon: Elam Dutch, MD;  Location: [place];  Service: Vascular;  Laterality: N/A;   IR PARACENTESIS  08/30/2017   IR PARACENTESIS  09/29/2017   IR PARACENTESIS  10/28/2017   IR PARACENTESIS  11/09/2017   IR PARACENTESIS  11/16/2017   IR PARACENTESIS  11/28/2017   IR PARACENTESIS  12/01/2017   IR PARACENTESIS  12/06/2017   IR PARACENTESIS  01/03/2018   IR PARACENTESIS  01/23/2018   IR PARACENTESIS  02/07/2018   IR PARACENTESIS  02/21/2018   IR PARACENTESIS   03/06/2018   IR PARACENTESIS  03/17/2018   IR PARACENTESIS  04/04/2018   IR PARACENTESIS  12/28/2018   IR PARACENTESIS  01/08/2019   IR PARACENTESIS  01/23/2019   IR RADIOLOGIST EVAL & MGMT  02/14/2018   LEFT HEART CATH AND CORONARY ANGIOGRAPHY N/A 02/22/2017   Procedure: LEFT HEART CATH AND CORONARY ANGIOGRAPHY;  Surgeon: Nigel Mormon, MD;  Location: [place] [facility];  Service: Cardiovascular;  Laterality: N/A;   LIGATION OF ARTERIOVENOUS  FISTULA Left 07/12/9145   Procedure: Plication of Left Arm Arteriovenous Fistula;  Surgeon: Elam Dutch, MD;  Location: [facility] OR;  Service: Vascular;  Laterality: Left;   POLYPECTOMY     POLYPECTOMY  01/25/2018   Procedure: POLYPECTOMY;  Surgeon: Jerene Bears, MD;  Location: [place];  Service: Gastroenterology;;   REVISON OF ARTERIOVENOUS FISTULA Left 03/09/5620   Procedure: PLICATION OF DISTAL ANEURYSMAL SEGEMENT OF LEFT UPPER ARM ARTERIOVENOUS FISTULA;  Surgeon: Elam Dutch, MD;  Location: [place];  Service: Vascular;  Laterality: Left;   REVISON OF ARTERIOVENOUS FISTULA Left 09/16/6576   Procedure: Plication of Left Upper Arm Fistula ;  Surgeon: Waynetta Sandy, MD;  Location: [place];  Service: Vascular;  Laterality: Left;   SKIN GRAFT SPLIT THICKNESS LEG / FOOT Left    SKIN GRAFT SPLIT THICKNESS LEFT ARM DONOR SITE: LEFT ANTERIOR THIGH   SKIN SPLIT GRAFT Left 07/04/2017   Procedure: SKIN GRAFT SPLIT THICKNESS LEFT ARM DONOR SITE: LEFT ANTERIOR THIGH;  Surgeon: Elam Dutch, MD;  Location: [place];  Service: Vascular;  Laterality: Left;   THROMBECTOMY W/ EMBOLECTOMY Left 06/05/2017   Procedure: EXPLORATION OF LEFT ARM FOR BLEEDING; OVERSEWED PROXIMAL FISTULA;  Surgeon: Angelia Mould, MD;  Location: [place];  Service: Vascular;  Laterality: Left;   WOUND EXPLORATION Left 06/03/2017   Procedure: WOUND EXPLORATION WITH WOUND VAC APPLICATION TO LEFT ARM;  Surgeon: Angelia Mould, MD;  Location: [facility] OR;   Service: Vascular;  Laterality: Left;        Home Medications    Prior to Admission medications   Medication Sig Start Date End Date Taking? Authorizing Provider  albuterol (VENTOLIN HFA) 108 (90 Base) MCG/ACT inhaler Inhale 2 puffs into the lungs every 6 (six) hours as needed for wheezing or shortness of breath.   Yes [provider]  docusate sodium ([employer] STOOL SOFTENER) 250 MG capsule Take 250 mg by mouth daily.   Yes [provider]  lactulose (CHRONULAC) 10 GM/15ML solution TAKE 45 MLS BY MOUTH 2 TIMES DAILY. Patient taking differently: Take 45 g by mouth 2 (two) times  daily.  01/30/19  Yes Pyrtle, Lajuan Lines, MD  metoprolol tartrate (LOPRESSOR) 25 MG tablet Take 1 tablet (25 mg total) by mouth 2 (two) times daily. 03/11/18  Yes Vann, Jessica U, DO  montelukast (SINGULAIR) 10 MG tablet Take 10 mg by mouth daily.   Yes [provider]  ondansetron (ZOFRAN) 8 MG tablet Take 8 mg by mouth every 8 (eight) hours as needed for nausea or vomiting.    Yes [provider]  pantoprazole (PROTONIX) 40 MG tablet Take 1 tablet (40 mg total) by mouth See admin instructions. Take one tablet (40 mg) by mouth in the morning on Sunday, Tuesday, Thursday, Saturday, take one tablet (40 mg) after dialysis on Monday, Wednesday, Friday. May also take one tablet (40 mg) in the evening as needed for acid reflux. 12/26/18  Yes Esterwood, Amy S, PA-C  sorbitol 70 % SOLN Take 30 mLs by mouth 3 (three) times daily as needed for constipation. 12/07/18  Yes [provider]  sulfamethoxazole-trimethoprim (BACTRIM DS) 800-160 MG tablet Take 1 tablet by mouth daily. 12/30/18  Yes Vann, Jessica U, DO  SYMBICORT 160-4.5 MCG/ACT inhaler Inhale 2 puffs into the lungs 2 (two) times a day. 11/30/18  Yes [provider]  zolpidem (AMBIEN) 5 MG tablet Take 5 mg by mouth at bedtime. 12/08/18  Yes [provider]  sevelamer carbonate (RENVELA) 800 MG tablet Take 4 tablets with meals   And 2 tablets twice daily with snacks Patient not taking: Reported on 12/28/2018 01/22/18   Patrecia Pour, MD    Family History Family History  Problem Relation Age of Onset   Heart disease Mother    Lung cancer Mother    Heart disease Father    Malignant hyperthermia Father    COPD Father    Throat cancer Sister    Esophageal cancer Sister    Hypertension Other    COPD Other    Colon cancer Neg Hx    Colon polyps Neg Hx    Rectal cancer Neg Hx    Stomach cancer Neg Hx     Social History Social History   Tobacco Use   Smoking status: Current Every Day Smoker    Packs/day: 0.50    Years: 43.00    Pack years: 21.50    Types: Cigarettes    Start date: 08/13/1973   Smokeless tobacco: Never Used  Substance Use Topics   Alcohol use: Not Currently    Frequency: Never    Comment: quit drinking in 2017   Drug use: Yes    Types: Marijuana, Cocaine    Comment: reports using once every 3 months     Allergies   Morphine and related, Aspirin, Clonidine derivatives, Tramadol, and Tylenol [acetaminophen]   Review of Systems Review of Systems  Constitutional: Positive for activity change.  Respiratory: Negative for shortness of breath.   Cardiovascular: Negative for chest pain.  Gastrointestinal: Positive for abdominal distention.  Hematological: Does not bruise/bleed easily.     Physical Exam Updated Vital Signs BP (!) 138/118    Pulse (!) 51    Temp 98.5 F (36.9 C) (Oral)    Resp 18    Ht 5\' 9"  (1.753 m)    Wt 72.6 kg    SpO2 100%    BMI 23.63 kg/m   Physical Exam Vitals signs and nursing note reviewed.  Constitutional:      Appearance: He is well-developed.  HENT:     Head: Atraumatic.  Neck:  Musculoskeletal: Neck supple.  Cardiovascular:     Rate and Rhythm: Normal rate.  Pulmonary:     Effort: Pulmonary effort is normal.  Abdominal:     General: There is distension.     Tenderness: There is abdominal tenderness. There is no guarding  or rebound.  Skin:    General: Skin is warm.  Neurological:     Mental Status: He is alert and oriented to person, place, and time.      ED Treatments / Results  Labs (all labs ordered are listed, but only abnormal results are displayed) Labs Reviewed  CBC WITH DIFFERENTIAL/PLATELET - Abnormal; Notable for the following components:      Result Value   RBC 3.45 (*)    Hemoglobin 10.1 (*)    HCT 28.8 (*)    RDW 16.2 (*)    Platelets 128 (*)    All other components within normal limits  COMPREHENSIVE METABOLIC PANEL - Abnormal; Notable for the following components:   Chloride 94 (*)    Glucose, Bld 131 (*)    Creatinine, Ser 5.25 (*)    Calcium 8.4 (*)    GFR calc non Af Amer 11 (*)    GFR calc Af Amer 13 (*)    All other components within normal limits  SARS CORONAVIRUS 2 (HOSPITAL ORDER, [place] [facility])    EKG EKG Interpretation  Date/Time:  Thursday February 01 2019 10:32:07 EDT Ventricular Rate:  95 PR Interval:    QRS Duration: 165 QT Interval:  503 QTC Calculation: 633 R Axis:   -87 Text Interpretation:  Atrial fibrillation Left bundle branch block peaked T waves No significant change since last tracing Confirmed by Varney Biles [phone]) on 02/01/2019 11:00:46 AM   Radiology Dg Chest Port 1 View  Result Date: 02/01/2019 CLINICAL DATA:  fluid in abdomen;sob, hx of COPD, afib, ESRD EXAM: PORTABLE CHEST 1 VIEW COMPARISON:  01/01/2019 FINDINGS: Enlarged cardiac silhouette, stable from the prior study. No mediastinal or hilar masses. Prominent bronchovascular interstitial markings, stable from the prior chest radiographs. No evidence of pneumonia. No pleural effusion or pneumothorax. Skeletal structures are grossly intact. IMPRESSION: 1. No significant change from the prior chest radiographs. 2. Cardiomegaly prominent bronchovascular and interstitial markings, but without convincing pulmonary edema. No evidence of pneumonia. Electronically Signed    By: Lajean Manes M.D.   On: 02/01/2019 12:14    Procedures Procedures (including critical care time)  Medications Ordered in ED Medications  lidocaine (XYLOCAINE) 1 % (with pres) injection (has no administration in time range)  lidocaine (XYLOCAINE) 1 % (with pres) injection (10 mLs Infiltration Given 02/01/19 1320)     Initial Impression / Assessment and Plan / ED Course  I have reviewed the triage vital signs and the nursing notes.  Pertinent labs & imaging results that were available during my care of the patient were reviewed by me and considered in my medical decision making (see chart for details).  Clinical Course as of Jan 31 1429  Thu Feb 01, 2019  1430 Procedures completed.  We will discharge.   [AN]    Clinical Course User Index [AN] Varney Biles, MD       58 year old comes in a chief complaint of abdominal distention. He has multiple medical comorbidities including ESRD on HD, liver cirrhosis, CAD.  It appears that he has been getting paracentesis routinely through IR.  He is not in any respiratory distress and does not need emergent paracentesis.  We have called  IR, who will try and schedule him for paracentesis today.  Final Clinical Impressions(s) / ED Diagnoses   Final diagnoses:  Pain  Ascites due to alcoholic cirrhosis [facility])    ED Discharge Orders    None       Varney Biles, MD 02/01/19 1243    Varney Biles, MD 02/01/19 1430

## 2019-02-01 NOTE — ED Notes (Signed)
ED Provider at bedside. 

## 2019-02-02 DIAGNOSIS — N186 End stage renal disease: Secondary | ICD-10-CM | POA: Diagnosis not present

## 2019-02-02 DIAGNOSIS — I120 Hypertensive chronic kidney disease with stage 5 chronic kidney disease or end stage renal disease: Secondary | ICD-10-CM | POA: Diagnosis not present

## 2019-02-02 DIAGNOSIS — D631 Anemia in chronic kidney disease: Secondary | ICD-10-CM | POA: Diagnosis not present

## 2019-02-02 DIAGNOSIS — D509 Iron deficiency anemia, unspecified: Secondary | ICD-10-CM | POA: Diagnosis not present

## 2019-02-02 DIAGNOSIS — N2581 Secondary hyperparathyroidism of renal origin: Secondary | ICD-10-CM | POA: Diagnosis not present

## 2019-02-05 ENCOUNTER — Telehealth: Payer: Self-pay | Admitting: *Deleted

## 2019-02-05 DIAGNOSIS — I120 Hypertensive chronic kidney disease with stage 5 chronic kidney disease or end stage renal disease: Secondary | ICD-10-CM | POA: Diagnosis not present

## 2019-02-05 DIAGNOSIS — N186 End stage renal disease: Secondary | ICD-10-CM | POA: Diagnosis not present

## 2019-02-05 DIAGNOSIS — D509 Iron deficiency anemia, unspecified: Secondary | ICD-10-CM | POA: Diagnosis not present

## 2019-02-05 DIAGNOSIS — D631 Anemia in chronic kidney disease: Secondary | ICD-10-CM | POA: Diagnosis not present

## 2019-02-05 DIAGNOSIS — N2581 Secondary hyperparathyroidism of renal origin: Secondary | ICD-10-CM | POA: Diagnosis not present

## 2019-02-05 NOTE — Telephone Encounter (Signed)
Covid-19 screening questions   Do you now or have you had a fever in the last 14 days? NO   Do you have any respiratory symptoms of shortness of breath or cough now or in the last 14 days? NO  Do you have any family members or close contacts with diagnosed or suspected Covid-19 in the past 14 days? NO  Have you been tested for Covid-19 and found to be positive? NO        

## 2019-02-06 ENCOUNTER — Ambulatory Visit (INDEPENDENT_AMBULATORY_CARE_PROVIDER_SITE_OTHER): Payer: Medicare Other | Admitting: Internal Medicine

## 2019-02-06 ENCOUNTER — Encounter: Payer: Self-pay | Admitting: Internal Medicine

## 2019-02-06 VITALS — BP 134/62 | HR 83 | Temp 97.8°F | Ht 69.0 in | Wt 149.0 lb

## 2019-02-06 DIAGNOSIS — G8929 Other chronic pain: Secondary | ICD-10-CM

## 2019-02-06 DIAGNOSIS — K7460 Unspecified cirrhosis of liver: Secondary | ICD-10-CM

## 2019-02-06 DIAGNOSIS — I2510 Atherosclerotic heart disease of native coronary artery without angina pectoris: Secondary | ICD-10-CM | POA: Diagnosis not present

## 2019-02-06 DIAGNOSIS — Z8619 Personal history of other infectious and parasitic diseases: Secondary | ICD-10-CM

## 2019-02-06 DIAGNOSIS — R188 Other ascites: Secondary | ICD-10-CM

## 2019-02-06 DIAGNOSIS — B182 Chronic viral hepatitis C: Secondary | ICD-10-CM | POA: Diagnosis not present

## 2019-02-06 DIAGNOSIS — R109 Unspecified abdominal pain: Secondary | ICD-10-CM

## 2019-02-06 DIAGNOSIS — B1910 Unspecified viral hepatitis B without hepatic coma: Secondary | ICD-10-CM

## 2019-02-06 DIAGNOSIS — K766 Portal hypertension: Secondary | ICD-10-CM | POA: Diagnosis not present

## 2019-02-06 MED ORDER — ALBUMIN HUMAN 25 % IV SOLN: 50.0000 g | INTRAVENOUS | Status: DC

## 2019-02-06 NOTE — Patient Instructions (Addendum)
You have been scheduled for an abdominal paracentesis at [facility] radiology (1st floor of hospital) on 02/15/2019 at 10:00 am. Please arrive at least 15 minutes prior to your appointment time for registration. Should you need to reschedule this appointment for any reason, please call our office at [phone].  Continue lactulose at current dosage.  Continue pantoprazole at current dosage.  Continue Bactrim at current daily dosage (prevention of SBP).  We will send your records to [facility] for opinion regarding your liver.  If you are age 85 or older, your body mass index should be between 23-30. Your Body mass index is 22 kg/m. If this is out of the aforementioned range listed, please consider follow up with your Primary Care Provider.  If you are age 72 or younger, your body mass index should be between 19-25. Your Body mass index is 22 kg/m. If this is out of the aformentioned range listed, please consider follow up with your Primary Care Provider.

## 2019-02-06 NOTE — Progress Notes (Signed)
Subjective:    Patient ID: [name], male    DOB: [birth year], 58 y.o.   MRN: [id number]  HPI [name] is a 58 year old male with a history of decompensated cirrhosis secondary to hepatitis B and C with recurrent ascites and history of SBP, end-stage renal disease on dialysis, history of atrial fibrillation, COPD, history of multiple colon polyps, GERD, prior substance abuse, who is seen for follow-up.  He is here alone today.  He was last seen in the office on 12/26/2018 by Nicoletta Ba, PA-C  He reports that he continues to struggle with recurrent ascites.  This is very frustrating to him and he wants to do something definitive to deal with the ascitic fluid.  He is having intermittent paracentesis which helps.  When the fluid builds up he has abdominal discomfort, early satiety and also more issues with his breathing.  After paracentesis he has less dyspnea on exertion and better appetite.  He wants to have a liver transplant and is asking for referral.  He also wants to know if there is a way to deal with the ascites.  He continues with dialysis but when the ascites is severe the dialysis makes him feel "really bad" and at times he is unable to finish his dialysis session.  He also is frustrated that he is losing weight and "wasting away".  He is not drinking alcohol.  He reports he is taking his lactulose twice a day and having 2-3 bowel movements per day.  He is taking Protonix 40 mg a day.  He also reports compliance with Bactrim 1 double strength tablet daily which he takes for SBP prophylaxis.  His last paracentesis was on 01/12/2019 where 2.2 L of fluid was removed.  Unfortunately the cell count was not done on this day.  Dr. Merrilee Jansky with [facility] reviewed the patient's chart.  Per note it was felt that he was not a liver transplant candidate due to issues with adherence to dialysis regimen.  I discussed this with the patient today who found this to be very frustrating.   He feels like no one is giving him a chance for transplant.  He states it is very difficult to complete dialysis when it makes you feel so bad.  He reports it is not that he does not want to finish dialysis but that it is simply impossible for him to do so sometimes because of the way he feels.  Review of Systems As per HPI, otherwise negative    Objective:   Physical Exam BP 134/62   Pulse 83   Temp 97.8 F (36.6 C)   Ht 5\' 9"  (1.753 m)   Wt 149 lb (67.6 kg)   BMI 22.00 kg/m  Gen: Chronically ill-appearing, alert, no acute distress HEENT: anicteric, op clear CV: RRR, 2/6 sem Pulm: CTA b/l but distant Abd: soft, mild distention, NT, +BS throughout Ext: no c/c/e Neuro: nonfocal, no asterixis  CT ABDOMEN AND PELVIS WITH CONTRAST   TECHNIQUE: Multidetector CT imaging of the abdomen and pelvis was performed using the standard protocol following bolus administration of intravenous contrast.   CONTRAST:  128mL OMNIPAQUE IOHEXOL 300 MG/ML  SOLN   COMPARISON:  August 03, 2018   FINDINGS: Lower chest: There is atelectatic change in the left base. Visualized lung bases otherwise are clear. There is cardiomegaly. There is a degree of left ventricular hypertrophy. There is calcification in the mitral valve.   Hepatobiliary: The liver has a subtly nodular contour, consistent  with cirrhosis. There is generalized decreased in liver attenuation, potentially with underlying hepatic steatosis. No focal liver lesions are evident. The portal and hepatic veins appear patent. Gallbladder is contracted there is no appreciable biliary duct dilatation.   Pancreas: There is no pancreatic mass or inflammatory focus.   Spleen: The spleen is normal in size and contour. No splenic lesions are evident.   Adrenals/Urinary Tract: Adrenals bilaterally appear unremarkable. Native kidneys are atrophic bilaterally. There is extensive vascular calcification in both main renal arteries as well as in  peripheral renal arterial branches. There is an approximately 1 x 1 cm cyst arising from the medial upper pole left kidney. There is no hydronephrosis on either side. There is no evident renal or ureteral calculus on either side. Urinary bladder is virtually empty. There is no appreciable urinary bladder wall thickening given virtually empty state.   Stomach/Bowel: There is no appreciable bowel wall or mesenteric thickening. A clip is noted in the rectum. There is no evident bowel obstruction. There is no free air or portal venous air.   Vascular/Lymphatic: There is extensive aortic and mesenteric arterial vascular calcification. There is extensive generalized pelvic arterial vascular calcification. Penile artery calcification bilaterally noted. No adenopathy is evident in the abdomen or pelvis.   Reproductive: Prostate and seminal vesicles appear normal in size and contour. There is mild prostatic calcification.   Other: There is marked ascites throughout the abdomen and pelvis. No abscess evident in the abdomen or pelvis. There is no periappendiceal region inflammatory change.   Musculoskeletal: Bones are diffusely sclerotic, consistent with secondary hyperparathyroidism, a consequence of chronic renal failure. No destructive bony changes evident. There is no evident intramuscular lesion. No abdominal wall lesions evident.   IMPRESSION: 1.  Evidence of hepatic cirrhosis.  No focal liver lesions evident.   2.  Extensive ascites throughout the abdomen and pelvis.   3. No evident bowel obstruction. No evident abscess in the abdomen or pelvis. No inflammatory foci evident.   4. Atrophic kidneys without hydronephrosis. No renal or ureteral calculi.   5.  Marked diffuse arterial vascular calcification.   6. Diffuse bony sclerosis, a consequence of chronic secondary hyperparathyroidism.     Electronically Signed   By: Lowella Grip III M.D.   On: 12/17/2018 13:58      CBC    Component Value Date/Time   WBC 6.5 02/01/2019 1102   RBC 3.45 (L) 02/01/2019 1102   HGB 10.1 (L) 02/01/2019 1102   HCT 28.8 (L) 02/01/2019 1102   PLT 128 (L) 02/01/2019 1102   MCV 83.5 02/01/2019 1102   MCH 29.3 02/01/2019 1102   MCHC 35.1 02/01/2019 1102   RDW 16.2 (H) 02/01/2019 1102   LYMPHSABS 1.2 02/01/2019 1102   MONOABS 0.6 02/01/2019 1102   EOSABS 0.1 02/01/2019 1102   BASOSABS 0.1 02/01/2019 1102   CMP     Component Value Date/Time   NA 136 02/01/2019 1104   K 4.6 02/01/2019 1104   CL 94 (L) 02/01/2019 1104   CO2 29 02/01/2019 1104   GLUCOSE 131 (H) 02/01/2019 1104   BUN 14 02/01/2019 1104   CREATININE 5.25 (H) 02/01/2019 1104   CREATININE 9.18 (H) 02/15/2017 1425   CALCIUM 8.4 (L) 02/01/2019 1104   CALCIUM 8.1 (L) 07/11/2011 1117   PROT 6.7 02/01/2019 1104   ALBUMIN 3.8 02/01/2019 1104   AST 30 02/01/2019 1104   ALT 19 02/01/2019 1104   ALT 39 07/22/2016 1400   ALKPHOS 116 02/01/2019 1104  BILITOT 0.8 02/01/2019 1104   GFRNONAA 11 (L) 02/01/2019 1104   GFRNONAA 6 (L) 02/15/2017 1425   GFRAA 13 (L) 02/01/2019 1104   GFRAA 7 (L) 02/15/2017 1425   Lab Results  Component Value Date   INR 1.2 (H) 12/26/2018   INR 1.26 03/11/2018   INR 1.26 01/21/2018        Assessment & Plan:  58 year old male with a history of decompensated cirrhosis secondary to hepatitis B and C with recurrent ascites and history of SBP, end-stage renal disease on dialysis, history of atrial fibrillation, COPD, history of multiple colon polyps, GERD, prior substance abuse, who is seen for follow-up.  1.  Cirrhosis with recurrent ascites --he has decompensated cirrhosis due to prior alcohol use in the setting of hepatitis B and C.  See #2 regarding viral hepatitis.  He is frustrated and interested in discussing liver transplantation.  [facility] did not feel that he was a liver transplant.  I think that is reasonable to get a second opinion.  I will refer him to [facility] for  their opinion.  I spent time today discussing portal hypertension with him and also management options for his ascites.  His end-stage renal disease and dialysis makes management of his ascites more difficult.  Options as I see that are repeated large-volume paracentesis and TIPS.  He prefers transplantation and would like his visit with [facility] to occur first before considering TIPS.  We did discuss the tips procedure including the risk of hepatic encephalopathy thereafter and also the rare but risk of hepatic failure due to TIPS.  He should continue low-sodium diet --For now we will schedule large-volume paracentesis with 50 g of IV albumin every 2 weeks, next would be 02/15/2019 --Low-sodium diet --Variceal screening up-to-date, last EGD was July 2019, repeat around July 2021 --[facility] screening up-to-date with recent cross-sectional imaging/CT abdomen pelvis.  No evidence for [place].  Repeat [place] screening with cross-sectional imaging in June 2021 versus ultrasound at that time --Referral to [facility] for consideration of transplant.  His MELD score is actually low but rather ascites is his biggest issue.  If [facility] does not feel he has a transplant candidate then we can consider IR referral to discuss TIPS --No evidence for hepatic encephalopathy today --continue lactulose 30 g twice daily  2.  Viral hepatitis, history of hepatitis B and C --need to determine activity of these infections.  His liver enzymes are normal. --Check viral hepatitis load of both hepatitis B and C  3.  History of multiple colon polyps --up-to-date with screening and surveillance colonoscopy.  Repeat colonoscopy recommended July 2022  4.  End-stage renal disease --on hemodialysis, Monday Wednesday and Friday.  5.  GERD --he does not have specific GERD symptoms.  I think continuing daily PPI is reasonable in the setting of cirrhosis for gastric protection.  Followup in 4-5 months, sooner as needed 25 minutes spent with the  patient today. Greater than 50% was spent in counseling and coordination of care with the patient

## 2019-02-07 DIAGNOSIS — D509 Iron deficiency anemia, unspecified: Secondary | ICD-10-CM | POA: Diagnosis not present

## 2019-02-07 DIAGNOSIS — I120 Hypertensive chronic kidney disease with stage 5 chronic kidney disease or end stage renal disease: Secondary | ICD-10-CM | POA: Diagnosis not present

## 2019-02-07 DIAGNOSIS — N186 End stage renal disease: Secondary | ICD-10-CM | POA: Diagnosis not present

## 2019-02-07 DIAGNOSIS — N2581 Secondary hyperparathyroidism of renal origin: Secondary | ICD-10-CM | POA: Diagnosis not present

## 2019-02-07 DIAGNOSIS — D631 Anemia in chronic kidney disease: Secondary | ICD-10-CM | POA: Diagnosis not present

## 2019-02-09 ENCOUNTER — Emergency Department (HOSPITAL_COMMUNITY)
Admission: EM | Admit: 2019-02-09 | Discharge: 2019-02-09 | Disposition: A | Discharge status: Home / Self Care | Payer: Medicare Other | Attending: Emergency Medicine | Admitting: Emergency Medicine

## 2019-02-09 ENCOUNTER — Telehealth: Payer: Self-pay | Admitting: *Deleted

## 2019-02-09 ENCOUNTER — Emergency Department (HOSPITAL_COMMUNITY): Payer: Medicare Other

## 2019-02-09 DIAGNOSIS — R0602 Shortness of breath: Secondary | ICD-10-CM | POA: Diagnosis not present

## 2019-02-09 DIAGNOSIS — K7689 Other specified diseases of liver: Secondary | ICD-10-CM | POA: Insufficient documentation

## 2019-02-09 DIAGNOSIS — Z209 Contact with and (suspected) exposure to unspecified communicable disease: Secondary | ICD-10-CM | POA: Diagnosis not present

## 2019-02-09 DIAGNOSIS — J449 Chronic obstructive pulmonary disease, unspecified: Secondary | ICD-10-CM | POA: Diagnosis not present

## 2019-02-09 DIAGNOSIS — I120 Hypertensive chronic kidney disease with stage 5 chronic kidney disease or end stage renal disease: Secondary | ICD-10-CM | POA: Diagnosis not present

## 2019-02-09 DIAGNOSIS — F17210 Nicotine dependence, cigarettes, uncomplicated: Secondary | ICD-10-CM | POA: Insufficient documentation

## 2019-02-09 DIAGNOSIS — N186 End stage renal disease: Secondary | ICD-10-CM | POA: Insufficient documentation

## 2019-02-09 DIAGNOSIS — I252 Old myocardial infarction: Secondary | ICD-10-CM | POA: Insufficient documentation

## 2019-02-09 DIAGNOSIS — I2510 Atherosclerotic heart disease of native coronary artery without angina pectoris: Secondary | ICD-10-CM | POA: Diagnosis not present

## 2019-02-09 DIAGNOSIS — Z79899 Other long term (current) drug therapy: Secondary | ICD-10-CM | POA: Insufficient documentation

## 2019-02-09 DIAGNOSIS — D631 Anemia in chronic kidney disease: Secondary | ICD-10-CM | POA: Diagnosis not present

## 2019-02-09 DIAGNOSIS — R1084 Generalized abdominal pain: Secondary | ICD-10-CM | POA: Diagnosis not present

## 2019-02-09 DIAGNOSIS — Z992 Dependence on renal dialysis: Secondary | ICD-10-CM | POA: Insufficient documentation

## 2019-02-09 DIAGNOSIS — R188 Other ascites: Secondary | ICD-10-CM

## 2019-02-09 DIAGNOSIS — D509 Iron deficiency anemia, unspecified: Secondary | ICD-10-CM | POA: Diagnosis not present

## 2019-02-09 DIAGNOSIS — N2581 Secondary hyperparathyroidism of renal origin: Secondary | ICD-10-CM | POA: Diagnosis not present

## 2019-02-09 LAB — BASIC METABOLIC PANEL
Anion gap: 15 (ref 5–15)
BUN: 19 mg/dL (ref 6–20)
CO2: 27 mmol/L (ref 22–32)
Calcium: 8.8 mg/dL — ABNORMAL LOW (ref 8.9–10.3)
Chloride: 96 mmol/L — ABNORMAL LOW (ref 98–111)
Creatinine, Ser: 6.25 mg/dL — ABNORMAL HIGH (ref 0.61–1.24)
GFR calc Af Amer: 11 mL/min — ABNORMAL LOW (ref >60)
GFR calc non Af Amer: 9 mL/min — ABNORMAL LOW (ref >60)
Glucose, Bld: 100 mg/dL — ABNORMAL HIGH (ref 70–99)
Potassium: 4.1 mmol/L (ref 3.5–5.1)
Sodium: 138 mmol/L (ref 135–145)

## 2019-02-09 MED ORDER — FENTANYL CITRATE (PF) 100 MCG/2ML IJ SOLN
50.0000 ug | Freq: Once | INTRAMUSCULAR | Status: DC
  Filled 2019-02-09: qty 2

## 2019-02-09 MED ORDER — ALBUTEROL SULFATE HFA 108 (90 BASE) MCG/ACT IN AERS
2.0000 | INHALATION_SPRAY | Freq: Once | RESPIRATORY_TRACT | Status: AC
  Administered 2019-02-09: 2 via RESPIRATORY_TRACT
  Filled 2019-02-09: qty 6.7

## 2019-02-09 MED ORDER — FENTANYL CITRATE (PF) 100 MCG/2ML IJ SOLN
50.0000 ug | Freq: Once | INTRAMUSCULAR | Status: AC
  Administered 2019-02-09: 50 ug via INTRAVENOUS

## 2019-02-09 NOTE — ED Triage Notes (Signed)
Pt from home with complaints of shortness of breath and 10/10 abdominal pain. Pt states is has been going on all day. Goes to HD M/W/F. Abdomin distended and tight to touch.

## 2019-02-09 NOTE — ED Notes (Signed)
Pt  Refusing to keep monitoring devices on

## 2019-02-09 NOTE — ED Notes (Signed)
Pt refusing gown

## 2019-02-09 NOTE — Discharge Instructions (Signed)
Please call the center to get another paracentesis scheduled within the next month.

## 2019-02-09 NOTE — Telephone Encounter (Signed)
[facility] referral has been placed. 64 pages of notes faxed to [phone]

## 2019-02-10 DIAGNOSIS — N186 End stage renal disease: Secondary | ICD-10-CM | POA: Diagnosis not present

## 2019-02-10 DIAGNOSIS — I120 Hypertensive chronic kidney disease with stage 5 chronic kidney disease or end stage renal disease: Secondary | ICD-10-CM | POA: Diagnosis not present

## 2019-02-10 DIAGNOSIS — Z992 Dependence on renal dialysis: Secondary | ICD-10-CM | POA: Diagnosis not present

## 2019-02-10 NOTE — ED Provider Notes (Signed)
[facility] EMERGENCY DEPARTMENT Provider Note   CSN: [id number] Arrival date & time: 02/09/19  0357     History   Chief Complaint Chief Complaint  Patient presents with  . Abdominal Pain  . Shortness of Breath    HPI [name] is a 58 y.o. male.      Abdominal Pain Pain location:  Generalized Pain quality: aching and sharp   Pain radiates to:  Does not radiate Pain severity:  Mild Onset quality:  Gradual Duration:  2 weeks Timing:  Constant Progression:  Worsening Chronicity:  Recurrent Relieved by:  None tried Worsened by:  Nothing Ineffective treatments:  None tried Associated symptoms: shortness of breath   Shortness of Breath Associated symptoms: abdominal pain     Past Medical History:  Diagnosis Date  . Anemia   . Anxiety   . Arthritis    left shoulder  . Atherosclerosis of aorta ([place])   . Cardiomegaly   . Chest pain    DATE UNKNOWN, C/O PERIODICALLY  . Cocaine abuse ([place])   . COPD exacerbation ([place]) 08/17/2016  . Coronary artery disease    stent 02/22/17  . ESRD (end stage renal disease) on dialysis ([place])    "[name]; MWF" (07/04/2017)  . GERD (gastroesophageal reflux disease)    DATE UNKNOWN  . Hemorrhoids   . Hepatitis B, chronic ([place])   . Hepatitis C   . History of kidney stones   . Hyperkalemia   . Hypertension   . Kidney failure   . Metabolic bone disease    Patient denies  . Mitral stenosis   . Myocardial infarction ([place])   . Pneumonia   . Pulmonary edema   . Solitary rectal ulcer syndrome 07/2017   at [facility] for rectal bleeding  . Tubular adenoma of colon     Patient Active Problem List   Diagnosis Date Noted  . Volume overload 12/28/2018  . Sepsis ([place]) 09/12/2018  . Atherosclerosis of native coronary artery of native heart without angina pectoris 03/11/2018  . Benign neoplasm of cecum   . Benign neoplasm of ascending colon   . Benign neoplasm of descending colon   . Benign neoplasm  of rectum   . Paroxysmal atrial fibrillation ([place]) 01/23/2018  . Hx of colonic polyps 01/20/2018  . ESRD (end stage renal disease) ([place]) 11/21/2017  . GERD (gastroesophageal reflux disease) 11/16/2017  . Liver cirrhosis ([place]) 11/15/2017  . DNR (do not resuscitate)   . Palliative care by specialist   . Hyponatremia 11/04/2017  . SBP (spontaneous bacterial peritonitis) ([place]) 10/30/2017  . Liver disease, chronic 10/30/2017  . SOB (shortness of breath)   . Abdominal pain 10/28/2017  . Upper airway cough syndrome with flattening on f/v loop 10/13/17 c/w vcd 10/17/2017  . Elevated diaphragm 10/13/2017  . Ileus ([place]) 09/29/2017  . Malnutrition of moderate degree 09/29/2017  . Sinus congestion 09/03/2017  . Symptomatic anemia 09/02/2017  . Other cirrhosis of liver ([place]) 09/02/2017  . Left bundle branch block 09/02/2017  . Mitral stenosis 09/02/2017  . Hematochezia 07/15/2017  . Wide-complex tachycardia ([place])   . Endotracheally intubated   . ESRD on dialysis ([place]) 07/04/2017  . Acute respiratory failure with hypoxia ([place]) 06/18/2017  . CKD (chronic kidney disease) stage V requiring chronic dialysis ([place]) 06/18/2017  . History of Cocaine abuse ([place]) 06/18/2017  . Hypertension 06/18/2017  . Infection of AV graft for dialysis ([place]) 06/18/2017  . Anxiety 06/18/2017  . Anemia due to chronic  kidney disease 06/18/2017  . Atrial flutter with rapid ventricular response ([place]) 06/18/2017  . Personality disorder ([place]) 06/13/2017  . Cellulitis 06/12/2017  . Adjustment disorder with mixed anxiety and depressed mood 06/10/2017  . Suicidal ideation 06/10/2017  . Arm wound, left, sequela 06/10/2017  . Dyspnea on exertion 05/29/2017  . Tachycardia 05/29/2017  . Hyperkalemia 05/22/2017  . Acute metabolic encephalopathy   . Anemia 04/23/2017  . Ascites 04/23/2017  . COPD (chronic obstructive pulmonary disease) ([place]) 04/23/2017  . Acute on chronic respiratory failure with hypoxia ([place]) 03/25/2017  .  Arrhythmia 03/25/2017  . COPD GOLD 0 with flattening on inps f/v  09/27/2016  . Essential hypertension 09/27/2016  . Fluid overload 08/30/2016  . COPD exacerbation ([place]) 08/17/2016  . Hypertensive urgency 08/17/2016  . Respiratory failure ([place]) 08/17/2016  . Problem with dialysis access ([place]) 07/23/2016  . Chronic hepatitis B ([place]) 03/05/2014  . Chronic hepatitis C without hepatic coma ([place]) 03/05/2014  . Internal hemorrhoids with bleeding, swelling and itching 03/05/2014  . Thrombocytopenia ([place]) 03/05/2014  . Chest pain 02/27/2014  . Alcohol abuse 04/14/2009  . Cigarette smoker 04/14/2009  . GANGLION CYST 04/14/2009    Past Surgical History:  Procedure Laterality Date  . A/V FISTULAGRAM Left 05/26/2017   Procedure: A/V FISTULAGRAM;  Surgeon: Conrad Ripley, MD;  Location: [facility];  Service: Cardiovascular;  Laterality: Left;  . A/V FISTULAGRAM Right 11/18/2017   Procedure: A/V FISTULAGRAM - Right Arm;  Surgeon: Elam Dutch, MD;  Location: [facility];  Service: Cardiovascular;  Laterality: Right;  . APPLICATION OF WOUND VAC Left 06/14/2017   Procedure: APPLICATION OF WOUND VAC;  Surgeon: Katha Cabal, MD;  Location: [facility] ORS;  Service: Vascular;  Laterality: Left;  . AV FISTULA PLACEMENT  2012   BELIEVED WAS PLACED IN JUNE  . AV FISTULA PLACEMENT Right 08/09/2017   Procedure: Creation Right arm ARTERIOVENOUS BRACHIOCEPOHALIC FISTULA;  Surgeon: Elam Dutch, MD;  Location: [facility] OR;  Service: Vascular;  Laterality: Right;  . AV FISTULA PLACEMENT Right 11/22/2017   Procedure: INSERTION OF ARTERIOVENOUS (AV) GORE-TEX GRAFT RIGHT UPPER ARM;  Surgeon: Elam Dutch, MD;  Location: [facility];  Service: Vascular;  Laterality: Right;  . BIOPSY  01/25/2018   Procedure: BIOPSY;  Surgeon: Jerene Bears, MD;  Location: [facility] ENDOSCOPY;  Service: Gastroenterology;;  . COLONOSCOPY    . COLONOSCOPY WITH PROPOFOL N/A 01/25/2018   Procedure: COLONOSCOPY WITH PROPOFOL;   Surgeon: Jerene Bears, MD;  Location: [facility];  Service: Gastroenterology;  Laterality: N/A;  . CORONARY STENT INTERVENTION N/A 02/22/2017   Procedure: CORONARY STENT INTERVENTION;  Surgeon: Nigel Mormon, MD;  Location: [facility] LAB;  Service: Cardiovascular;  Laterality: N/A;  . ESOPHAGOGASTRODUODENOSCOPY (EGD) WITH PROPOFOL N/A 01/25/2018   Procedure: ESOPHAGOGASTRODUODENOSCOPY (EGD) WITH PROPOFOL;  Surgeon: Jerene Bears, MD;  Location: [facility];  Service: Gastroenterology;  Laterality: N/A;  . FLEXIBLE SIGMOIDOSCOPY N/A 07/15/2017   Procedure: FLEXIBLE SIGMOIDOSCOPY;  Surgeon: Carol Ada, MD;  Location: [facility];  Service: Endoscopy;  Laterality: N/A;  . HEMORRHOID BANDING    . I&D EXTREMITY Left 06/01/2017   Procedure: IRRIGATION AND DEBRIDEMENT LEFT ARM HEMATOMA WITH LIGATION OF LEFT ARM AV FISTULA;  Surgeon: Elam Dutch, MD;  Location: [facility];  Service: Vascular;  Laterality: Left;  . I&D EXTREMITY Left 06/14/2017   Procedure: IRRIGATION AND DEBRIDEMENT EXTREMITY;  Surgeon: Katha Cabal, MD;  Location: [facility] ORS;  Service: Vascular;  Laterality: Left;  .  INSERTION OF DIALYSIS CATHETER  05/30/2017  . INSERTION OF DIALYSIS CATHETER N/A 05/30/2017   Procedure: INSERTION OF DIALYSIS CATHETER;  Surgeon: Elam Dutch, MD;  Location: [facility];  Service: Vascular;  Laterality: N/A;  . IR PARACENTESIS  08/30/2017  . IR PARACENTESIS  09/29/2017  . IR PARACENTESIS  10/28/2017  . IR PARACENTESIS  11/09/2017  . IR PARACENTESIS  11/16/2017  . IR PARACENTESIS  11/28/2017  . IR PARACENTESIS  12/01/2017  . IR PARACENTESIS  12/06/2017  . IR PARACENTESIS  01/03/2018  . IR PARACENTESIS  01/23/2018  . IR PARACENTESIS  02/07/2018  . IR PARACENTESIS  02/21/2018  . IR PARACENTESIS  03/06/2018  . IR PARACENTESIS  03/17/2018  . IR PARACENTESIS  04/04/2018  . IR PARACENTESIS  12/28/2018  . IR PARACENTESIS  01/08/2019  . IR PARACENTESIS  01/23/2019  . IR PARACENTESIS  02/01/2019  . IR  RADIOLOGIST EVAL & MGMT  02/14/2018  . LEFT HEART CATH AND CORONARY ANGIOGRAPHY N/A 02/22/2017   Procedure: LEFT HEART CATH AND CORONARY ANGIOGRAPHY;  Surgeon: Nigel Mormon, MD;  Location: [facility];  Service: Cardiovascular;  Laterality: N/A;  . LIGATION OF ARTERIOVENOUS  FISTULA Left 10/12/5954   Procedure: Plication of Left Arm Arteriovenous Fistula;  Surgeon: Elam Dutch, MD;  Location: [facility];  Service: Vascular;  Laterality: Left;  . POLYPECTOMY    . POLYPECTOMY  01/25/2018   Procedure: POLYPECTOMY;  Surgeon: Jerene Bears, MD;  Location: [facility];  Service: Gastroenterology;;  . REVISON OF ARTERIOVENOUS FISTULA Left 3/87/5643   Procedure: PLICATION OF DISTAL ANEURYSMAL SEGEMENT OF LEFT UPPER ARM ARTERIOVENOUS FISTULA;  Surgeon: Elam Dutch, MD;  Location: [facility];  Service: Vascular;  Laterality: Left;  . REVISON OF ARTERIOVENOUS FISTULA Left 10/08/5186   Procedure: Plication of Left Upper Arm Fistula ;  Surgeon: Waynetta Sandy, MD;  Location: [facility];  Service: Vascular;  Laterality: Left;  . SKIN GRAFT SPLIT THICKNESS LEG / FOOT Left    SKIN GRAFT SPLIT THICKNESS LEFT ARM DONOR SITE: LEFT ANTERIOR THIGH  . SKIN SPLIT GRAFT Left 07/04/2017   Procedure: SKIN GRAFT SPLIT THICKNESS LEFT ARM DONOR SITE: LEFT ANTERIOR THIGH;  Surgeon: Elam Dutch, MD;  Location: [facility];  Service: Vascular;  Laterality: Left;  . THROMBECTOMY W/ EMBOLECTOMY Left 06/05/2017   Procedure: EXPLORATION OF LEFT ARM FOR BLEEDING; OVERSEWED PROXIMAL FISTULA;  Surgeon: Angelia Mould, MD;  Location: [facility];  Service: Vascular;  Laterality: Left;  . WOUND EXPLORATION Left 06/03/2017   Procedure: WOUND EXPLORATION WITH WOUND VAC APPLICATION TO LEFT ARM;  Surgeon: Angelia Mould, MD;  Location: [facility];  Service: Vascular;  Laterality: Left;        Home Medications    Prior to Admission medications   Medication Sig Start Date End Date Taking? Authorizing Provider   albuterol (VENTOLIN HFA) 108 (90 Base) MCG/ACT inhaler Inhale 2 puffs into the lungs every 6 (six) hours as needed for wheezing or shortness of breath.    [provider]  docusate sodium ([employer] STOOL SOFTENER) 250 MG capsule Take 250 mg by mouth daily.    [provider]  lactulose (CHRONULAC) 10 GM/15ML solution TAKE 45 MLS BY MOUTH 2 TIMES DAILY. Patient taking differently: Take 45 g by mouth 2 (two) times daily.  01/30/19   Pyrtle, Lajuan Lines, MD  metoprolol tartrate (LOPRESSOR) 25 MG tablet Take 1 tablet (25 mg total) by mouth 2 (two) times daily. 03/11/18   Eliseo Squires,  Jessica U, DO  montelukast (SINGULAIR) 10 MG tablet Take 10 mg by mouth daily.    [provider]  ondansetron (ZOFRAN) 8 MG tablet Take 8 mg by mouth every 8 (eight) hours as needed for nausea or vomiting.     [provider]  pantoprazole (PROTONIX) 40 MG tablet Take 1 tablet (40 mg total) by mouth See admin instructions. Take one tablet (40 mg) by mouth in the morning on Sunday, Tuesday, Thursday, Saturday, take one tablet (40 mg) after dialysis on Monday, Wednesday, Friday. May also take one tablet (40 mg) in the evening as needed for acid reflux. 12/26/18   Esterwood, Amy S, PA-C  sevelamer carbonate (RENVELA) 800 MG tablet Take 4 tablets with meals  And 2 tablets twice daily with snacks 01/22/18   Patrecia Pour, MD  sorbitol 70 % SOLN Take 30 mLs by mouth 3 (three) times daily as needed for constipation. 12/07/18   [provider]  sulfamethoxazole-trimethoprim (BACTRIM DS) 800-160 MG tablet Take 1 tablet by mouth daily. 12/30/18   Geradine Girt, DO  SYMBICORT 160-4.5 MCG/ACT inhaler Inhale 2 puffs into the lungs 2 (two) times a day. 11/30/18   [provider]  zolpidem (AMBIEN) 5 MG tablet Take 5 mg by mouth at bedtime. 12/08/18   [provider]    Family History Family History  Problem Relation Age of Onset  . Heart disease Mother   . Lung cancer Mother   . Heart disease  Father   . Malignant hyperthermia Father   . COPD Father   . Throat cancer Sister   . Esophageal cancer Sister   . Hypertension Other   . COPD Other   . Colon cancer Neg Hx   . Colon polyps Neg Hx   . Rectal cancer Neg Hx   . Stomach cancer Neg Hx     Social History Social History   Tobacco Use  . Smoking status: Current Every Day Smoker    Packs/day: 0.50    Years: 43.00    Pack years: 21.50    Types: Cigarettes    Start date: 08/13/1973  . Smokeless tobacco: Never Used  Substance Use Topics  . Alcohol use: Not Currently    Frequency: Never    Comment: quit drinking in 2017  . Drug use: Yes    Types: Marijuana, Cocaine    Comment: reports using once every 3 months     Allergies   Morphine and related, Aspirin, Clonidine derivatives, Tramadol, and Tylenol [acetaminophen]   Review of Systems Review of Systems  Respiratory: Positive for shortness of breath.   Gastrointestinal: Positive for abdominal pain.  All other systems reviewed and are negative.    Physical Exam Updated Vital Signs BP (!) 165/90 (BP Location: Right Arm)   Pulse 60   Temp 98.1 F (36.7 C) (Oral)   Resp 20   SpO2 100%   Physical Exam Vitals signs and nursing note reviewed.  Constitutional:      Appearance: He is well-developed.  HENT:     Head: Normocephalic and atraumatic.  Neck:     Musculoskeletal: Normal range of motion.  Cardiovascular:     Rate and Rhythm: Normal rate.  Pulmonary:     Effort: Pulmonary effort is normal. No respiratory distress.     Breath sounds: No decreased breath sounds or wheezing.  Abdominal:     General: Bowel sounds are normal. There is distension.     Palpations: Abdomen is soft. There is  fluid wave.     Tenderness: There is no rebound.  Musculoskeletal: Normal range of motion.  Skin:    General: Skin is warm and dry.  Neurological:     General: No focal deficit present.     Mental Status: He is alert.      ED Treatments / Results  Labs  (all labs ordered are listed, but only abnormal results are displayed) Labs Reviewed  BASIC METABOLIC PANEL - Abnormal; Notable for the following components:      Result Value   Chloride 96 (*)    Glucose, Bld 100 (*)    Creatinine, Ser 6.25 (*)    Calcium 8.8 (*)    GFR calc non Af Amer 9 (*)    GFR calc Af Amer 11 (*)    All other components within normal limits    EKG None  Radiology Dg Chest Portable 1 View  Result Date: 02/09/2019 CLINICAL DATA:  Shortness of breath EXAM: PORTABLE CHEST 1 VIEW COMPARISON:  02/01/2019 FINDINGS: Cardiomegaly. Chronic diffuse interstitial opacity. No effusion or pneumothorax. Atherosclerosis. IMPRESSION: Cardiomegaly and vascular congestion that is stable compared to multiple priors. Electronically Signed   By: Monte Fantasia M.D.   On: 02/09/2019 04:56    Procedures Procedures (including critical care time)  Medications Ordered in ED Medications  albuterol (VENTOLIN HFA) 108 (90 Base) MCG/ACT inhaler 2 puff (2 puffs Inhalation Given 02/09/19 0508)  fentaNYL (SUBLIMAZE) injection 50 mcg (50 mcg Intravenous Given 02/09/19 0521)     Initial Impression / Assessment and Plan / ED Course  I have reviewed the triage vital signs and the nursing notes.  Pertinent labs & imaging results that were available during my care of the patient were reviewed by me and considered in my medical decision making (see chart for details).   Here with pain in abdomen from distension, will work with IR to receive another paracentesis. No resp distress or concern for SBP to be concerned for emergent need at this time.   Final Clinical Impressions(s) / ED Diagnoses   Final diagnoses:  Other ascites    ED Discharge Orders    None       Cory Rama, Corene Cornea, MD 02/10/19 [phone]

## 2019-02-11 ENCOUNTER — Encounter (HOSPITAL_COMMUNITY): Payer: Self-pay | Admitting: Emergency Medicine

## 2019-02-11 ENCOUNTER — Emergency Department (HOSPITAL_COMMUNITY)
Admission: EM | Admit: 2019-02-11 | Discharge: 2019-02-11 | Discharge status: Against Medical Advice | Payer: Medicare Other | Attending: Emergency Medicine | Admitting: Emergency Medicine

## 2019-02-11 ENCOUNTER — Other Ambulatory Visit: Payer: Self-pay

## 2019-02-11 ENCOUNTER — Emergency Department (HOSPITAL_COMMUNITY): Payer: Medicare Other

## 2019-02-11 DIAGNOSIS — I10 Essential (primary) hypertension: Secondary | ICD-10-CM | POA: Diagnosis not present

## 2019-02-11 DIAGNOSIS — Z5320 Procedure and treatment not carried out because of patient's decision for unspecified reasons: Secondary | ICD-10-CM | POA: Insufficient documentation

## 2019-02-11 DIAGNOSIS — R188 Other ascites: Secondary | ICD-10-CM | POA: Diagnosis not present

## 2019-02-11 DIAGNOSIS — R1084 Generalized abdominal pain: Secondary | ICD-10-CM | POA: Diagnosis not present

## 2019-02-11 DIAGNOSIS — K7031 Alcoholic cirrhosis of liver with ascites: Secondary | ICD-10-CM | POA: Diagnosis not present

## 2019-02-11 DIAGNOSIS — R0602 Shortness of breath: Secondary | ICD-10-CM | POA: Diagnosis not present

## 2019-02-11 LAB — COMPREHENSIVE METABOLIC PANEL
ALT: 17 U/L (ref 0–44)
AST: 28 U/L (ref 15–41)
Albumin: 3.8 g/dL (ref 3.5–5.0)
Alkaline Phosphatase: 109 U/L (ref 38–126)
Anion gap: 10 (ref 5–15)
BUN: 22 mg/dL — ABNORMAL HIGH (ref 6–20)
CO2: 26 mmol/L (ref 22–32)
Calcium: 8.5 mg/dL — ABNORMAL LOW (ref 8.9–10.3)
Chloride: 97 mmol/L — ABNORMAL LOW (ref 98–111)
Creatinine, Ser: 7.27 mg/dL — ABNORMAL HIGH (ref 0.61–1.24)
GFR calc Af Amer: 9 mL/min — ABNORMAL LOW (ref >60)
GFR calc non Af Amer: 8 mL/min — ABNORMAL LOW (ref >60)
Glucose, Bld: 89 mg/dL (ref 70–99)
Potassium: 4.6 mmol/L (ref 3.5–5.1)
Sodium: 133 mmol/L — ABNORMAL LOW (ref 135–145)
Total Bilirubin: 1.2 mg/dL (ref 0.3–1.2)
Total Protein: 6.8 g/dL (ref 6.5–8.1)

## 2019-02-11 LAB — CBC
HCT: 31 % — ABNORMAL LOW (ref 39.0–52.0)
Hemoglobin: 10.8 g/dL — ABNORMAL LOW (ref 13.0–17.0)
MCH: 29.3 pg (ref 26.0–34.0)
MCHC: 34.8 g/dL (ref 30.0–36.0)
MCV: 84.2 fL (ref 80.0–100.0)
Platelets: 123 10*3/uL — ABNORMAL LOW (ref 150–400)
RBC: 3.68 MIL/uL — ABNORMAL LOW (ref 4.22–5.81)
RDW: 16.6 % — ABNORMAL HIGH (ref 11.5–15.5)
WBC: 8.2 10*3/uL (ref 4.0–10.5)
nRBC: 0 % (ref 0.0–0.2)

## 2019-02-11 LAB — BODY FLUID CELL COUNT WITH DIFFERENTIAL
Eos, Fluid: 0 %
Lymphs, Fluid: 29 %
Monocyte-Macrophage-Serous Fluid: 67 % (ref 50–90)
Neutrophil Count, Fluid: 4 % (ref 0–25)
Total Nucleated Cell Count, Fluid: 78 cu mm (ref 0–1000)

## 2019-02-11 LAB — GRAM STAIN

## 2019-02-11 LAB — PROTIME-INR
INR: 1.2 (ref 0.8–1.2)
Prothrombin Time: 15.2 seconds (ref 11.4–15.2)

## 2019-02-11 LAB — LIPASE, BLOOD: Lipase: 24 U/L (ref 11–51)

## 2019-02-11 MED ORDER — LIDOCAINE HCL (PF) 1 % IJ SOLN
INTRAMUSCULAR | Status: AC
  Filled 2019-02-11: qty 5

## 2019-02-11 MED ORDER — HYDROMORPHONE HCL 1 MG/ML IJ SOLN: 1.0000 mg | Freq: Once | INTRAMUSCULAR | Status: DC

## 2019-02-11 MED ORDER — LIDOCAINE HCL (PF) 1 % IJ SOLN
INTRAMUSCULAR | Status: AC
  Filled 2019-02-11: qty 10

## 2019-02-11 NOTE — ED Notes (Signed)
This RN attempted to obtain blood specimen from pt. After RN stuck pt and began to draw blood pt demanded RN to take needle out of his arm and pt began moving arm. This RN removed needle from pt arm as requested by pt. RN unable to obtain blood specimen from pt at this time.

## 2019-02-11 NOTE — Procedures (Signed)
PROCEDURE SUMMARY:  Successful image-guided paracentesis from the left lower abdomen.  Yielded 2.7 liters of amber fluid.  No immediate complications.  EBL: zero Patient tolerated well.   Specimen was sent for labs.  Please see imaging section of Epic for full dictation.  Joaquim Nam PA-C 02/11/2019 3:34 PM

## 2019-02-11 NOTE — ED Triage Notes (Signed)
Pt BIB [facility] from home. Pt complaining of shortness of breath that is worse upon exertion and abdominal pain. Pt was seen 7/31 for same. Pt up walking upon arrival to room.

## 2019-02-11 NOTE — ED Notes (Signed)
Pt given [place] sandwich, cheese, apple sauce and coke

## 2019-02-11 NOTE — ED Notes (Signed)
Pt refused to stay for results/dispo. Pt eloped.

## 2019-02-11 NOTE — ED Provider Notes (Signed)
[facility] Emergency Department Provider Note MRN:  [id number]  Arrival date & time: 02/11/19     Chief Complaint   Shortness of Breath and Abdominal Pain   History of Present Illness   [name] is a 58 y.o. year-old male with a history of cirrhosis presenting to the ED with chief complaint of abdominal pain.  Pain is gradual onset located diffusely in the abdomen, associated with swelling.  He feels like his fluid in his belly has come back.  He gets regular paracentesis.  He denies fever, feels like the amount of fluid in his abdomen is keeping him from taking deep breaths.  Denies chest pain, no dysuria.  Pain is constant, moderate, no exacerbating or alleviating factors.  Review of Systems  A complete 10 system review of systems was obtained and all systems are negative except as noted in the HPI and PMH.   Patient's Health History    Past Medical History:  Diagnosis Date  . Anemia   . Anxiety   . Arthritis    left shoulder  . Atherosclerosis of aorta ([place])   . Cardiomegaly   . Chest pain    DATE UNKNOWN, C/O PERIODICALLY  . Cocaine abuse ([place])   . COPD exacerbation ([place]) 08/17/2016  . Coronary artery disease    stent 02/22/17  . ESRD (end stage renal disease) on dialysis ([place])    "[name]; MWF" (07/04/2017)  . GERD (gastroesophageal reflux disease)    DATE UNKNOWN  . Hemorrhoids   . Hepatitis B, chronic ([place])   . Hepatitis C   . History of kidney stones   . Hyperkalemia   . Hypertension   . Kidney failure   . Metabolic bone disease    Patient denies  . Mitral stenosis   . Myocardial infarction ([place])   . Pneumonia   . Pulmonary edema   . Solitary rectal ulcer syndrome 07/2017   at flex sig for rectal bleeding  . Tubular adenoma of colon     Past Surgical History:  Procedure Laterality Date  . A/V FISTULAGRAM Left 05/26/2017   Procedure: A/V FISTULAGRAM;  Surgeon: Conrad [place], MD;  Location: [place] [facility];  Service:  Cardiovascular;  Laterality: Left;  . A/V FISTULAGRAM Right 11/18/2017   Procedure: A/V FISTULAGRAM - Right Arm;  Surgeon: Elam Dutch, MD;  Location: [place] [facility];  Service: Cardiovascular;  Laterality: Right;  . APPLICATION OF WOUND VAC Left 06/14/2017   Procedure: APPLICATION OF WOUND VAC;  Surgeon: Katha Cabal, MD;  Location: [facility] ORS;  Service: Vascular;  Laterality: Left;  . AV FISTULA PLACEMENT  2012   BELIEVED WAS PLACED IN JUNE  . AV FISTULA PLACEMENT Right 08/09/2017   Procedure: Creation Right arm ARTERIOVENOUS BRACHIOCEPOHALIC FISTULA;  Surgeon: Elam Dutch, MD;  Location: [facility] OR;  Service: Vascular;  Laterality: Right;  . AV FISTULA PLACEMENT Right 11/22/2017   Procedure: INSERTION OF ARTERIOVENOUS (AV) GORE-TEX GRAFT RIGHT UPPER ARM;  Surgeon: Elam Dutch, MD;  Location: [place];  Service: Vascular;  Laterality: Right;  . BIOPSY  01/25/2018   Procedure: BIOPSY;  Surgeon: Jerene Bears, MD;  Location: [facility] ENDOSCOPY;  Service: Gastroenterology;;  . COLONOSCOPY    . COLONOSCOPY WITH PROPOFOL N/A 01/25/2018   Procedure: COLONOSCOPY WITH PROPOFOL;  Surgeon: Jerene Bears, MD;  Location: [place];  Service: Gastroenterology;  Laterality: N/A;  . CORONARY STENT INTERVENTION N/A 02/22/2017   Procedure: CORONARY STENT INTERVENTION;  Surgeon: Nigel Mormon, MD;  Location: [place] [facility];  Service: Cardiovascular;  Laterality: N/A;  . ESOPHAGOGASTRODUODENOSCOPY (EGD) WITH PROPOFOL N/A 01/25/2018   Procedure: ESOPHAGOGASTRODUODENOSCOPY (EGD) WITH PROPOFOL;  Surgeon: Jerene Bears, MD;  Location: [place];  Service: Gastroenterology;  Laterality: N/A;  . FLEXIBLE SIGMOIDOSCOPY N/A 07/15/2017   Procedure: FLEXIBLE SIGMOIDOSCOPY;  Surgeon: Carol Ada, MD;  Location: [place];  Service: Endoscopy;  Laterality: N/A;  . HEMORRHOID BANDING    . I&D EXTREMITY Left 06/01/2017   Procedure: IRRIGATION AND DEBRIDEMENT LEFT ARM HEMATOMA WITH LIGATION OF LEFT  ARM AV FISTULA;  Surgeon: Elam Dutch, MD;  Location: [place];  Service: Vascular;  Laterality: Left;  . I&D EXTREMITY Left 06/14/2017   Procedure: IRRIGATION AND DEBRIDEMENT EXTREMITY;  Surgeon: Katha Cabal, MD;  Location: [facility] ORS;  Service: Vascular;  Laterality: Left;  . INSERTION OF DIALYSIS CATHETER  05/30/2017  . INSERTION OF DIALYSIS CATHETER N/A 05/30/2017   Procedure: INSERTION OF DIALYSIS CATHETER;  Surgeon: Elam Dutch, MD;  Location: [place];  Service: Vascular;  Laterality: N/A;  . IR PARACENTESIS  08/30/2017  . IR PARACENTESIS  09/29/2017  . IR PARACENTESIS  10/28/2017  . IR PARACENTESIS  11/09/2017  . IR PARACENTESIS  11/16/2017  . IR PARACENTESIS  11/28/2017  . IR PARACENTESIS  12/01/2017  . IR PARACENTESIS  12/06/2017  . IR PARACENTESIS  01/03/2018  . IR PARACENTESIS  01/23/2018  . IR PARACENTESIS  02/07/2018  . IR PARACENTESIS  02/21/2018  . IR PARACENTESIS  03/06/2018  . IR PARACENTESIS  03/17/2018  . IR PARACENTESIS  04/04/2018  . IR PARACENTESIS  12/28/2018  . IR PARACENTESIS  01/08/2019  . IR PARACENTESIS  01/23/2019  . IR PARACENTESIS  02/01/2019  . IR RADIOLOGIST EVAL & MGMT  02/14/2018  . LEFT HEART CATH AND CORONARY ANGIOGRAPHY N/A 02/22/2017   Procedure: LEFT HEART CATH AND CORONARY ANGIOGRAPHY;  Surgeon: Nigel Mormon, MD;  Location: [place] [facility];  Service: Cardiovascular;  Laterality: N/A;  . LIGATION OF ARTERIOVENOUS  FISTULA Left 09/10/5174   Procedure: Plication of Left Arm Arteriovenous Fistula;  Surgeon: Elam Dutch, MD;  Location: [place];  Service: Vascular;  Laterality: Left;  . POLYPECTOMY    . POLYPECTOMY  01/25/2018   Procedure: POLYPECTOMY;  Surgeon: Jerene Bears, MD;  Location: [place];  Service: Gastroenterology;;  . REVISON OF ARTERIOVENOUS FISTULA Left 1/60/7371   Procedure: PLICATION OF DISTAL ANEURYSMAL SEGEMENT OF LEFT UPPER ARM ARTERIOVENOUS FISTULA;  Surgeon: Elam Dutch, MD;  Location: [place];  Service: Vascular;   Laterality: Left;  . REVISON OF ARTERIOVENOUS FISTULA Left 0/62/6948   Procedure: Plication of Left Upper Arm Fistula ;  Surgeon: Waynetta Sandy, MD;  Location: [place];  Service: Vascular;  Laterality: Left;  . SKIN GRAFT SPLIT THICKNESS LEG / FOOT Left    SKIN GRAFT SPLIT THICKNESS LEFT ARM DONOR SITE: LEFT ANTERIOR THIGH  . SKIN SPLIT GRAFT Left 07/04/2017   Procedure: SKIN GRAFT SPLIT THICKNESS LEFT ARM DONOR SITE: LEFT ANTERIOR THIGH;  Surgeon: Elam Dutch, MD;  Location: [place];  Service: Vascular;  Laterality: Left;  . THROMBECTOMY W/ EMBOLECTOMY Left 06/05/2017   Procedure: EXPLORATION OF LEFT ARM FOR BLEEDING; OVERSEWED PROXIMAL FISTULA;  Surgeon: Angelia Mould, MD;  Location: [place];  Service: Vascular;  Laterality: Left;  . WOUND EXPLORATION Left 06/03/2017   Procedure: WOUND EXPLORATION WITH WOUND VAC APPLICATION TO LEFT ARM;  Surgeon: Angelia Mould,  MD;  Location: [facility];  Service: Vascular;  Laterality: Left;    Family History  Problem Relation Age of Onset  . Heart disease Mother   . Lung cancer Mother   . Heart disease Father   . Malignant hyperthermia Father   . COPD Father   . Throat cancer Sister   . Esophageal cancer Sister   . Hypertension Other   . COPD Other   . Colon cancer Neg Hx   . Colon polyps Neg Hx   . Rectal cancer Neg Hx   . Stomach cancer Neg Hx     Social History   Socioeconomic History  . Marital status: Single    Spouse name: Not on file  . Number of children: 3  . Years of education: 10  . Highest education level: Not on file  Occupational History  . Occupation: Unemployed  Social Needs  . Financial resource strain: Not on file  . Food insecurity    Worry: Not on file    Inability: Not on file  . Transportation needs    Medical: Not on file    Non-medical: Not on file  Tobacco Use  . Smoking status: Current Every Day Smoker    Packs/day: 0.50    Years: 43.00    Pack years: 21.50    Types: Cigarettes     Start date: 08/13/1973  . Smokeless tobacco: Never Used  Substance and Sexual Activity  . Alcohol use: Not Currently    Frequency: Never    Comment: quit drinking in 2017  . Drug use: Yes    Types: Marijuana, Cocaine    Comment: reports using once every 3 months  . Sexual activity: Not on file  Lifestyle  . Physical activity    Days per week: Not on file    Minutes per session: Not on file  . Stress: Not on file  Relationships  . Social Herbalist on phone: Not on file    Gets together: Not on file    Attends religious service: Not on file    Active member of club or organization: Not on file    Attends meetings of clubs or organizations: Not on file    Relationship status: Not on file  . Intimate partner violence    Fear of current or ex partner: Not on file    Emotionally abused: Not on file    Physically abused: Not on file    Forced sexual activity: Not on file  Other Topics Concern  . Not on file  Social History Narrative   Lives alone   Caffeine use: Coffee-rare   Soda- daily       Pulmonary (03/10/17):   Originally from [place] [facility]. Previously worked trimming trees. No pets currently. No bird or mold exposure.      Physical Exam  Vital Signs and Nursing Notes reviewed Vitals:   02/11/19 1320  BP: (!) 187/92  Pulse: (!) 53  Resp: (!) 25  Temp: 98.1 F (36.7 C)  SpO2: 100%    CONSTITUTIONAL: Chronically ill-appearing, NAD NEURO:  Alert and oriented x 3, no focal deficits EYES:  eyes equal and reactive ENT/NECK:  no LAD, no JVD CARDIO: Regular rate, well-perfused, normal S1 and S2 PULM:  CTAB no wheezing or rhonchi GI/GU:  normal bowel sounds, significant abdominal distention with fluid wave, mild diffuse tenderness MSK/SPINE:  No gross deformities, no edema SKIN:  no rash, atraumatic PSYCH:  Appropriate speech and behavior  Diagnostic and  Interventional Summary    Labs Reviewed  CBC - Abnormal; Notable for the following components:       Result Value   RBC 3.68 (*)    Hemoglobin 10.8 (*)    HCT 31.0 (*)    RDW 16.6 (*)    Platelets 123 (*)    All other components within normal limits  COMPREHENSIVE METABOLIC PANEL - Abnormal; Notable for the following components:   Sodium 133 (*)    Chloride 97 (*)    BUN 22 (*)    Creatinine, Ser 7.27 (*)    Calcium 8.5 (*)    GFR calc non Af Amer 8 (*)    GFR calc Af Amer 9 (*)    All other components within normal limits  LIPASE, BLOOD  PROTIME-INR    US Paracentesis    (Results Pending)    Medications  HYDROmorphone (DILAUDID) injection 1 mg (has no administration in time range)  lidocaine (PF) (XYLOCAINE) 1 % injection (has no administration in time range)  lidocaine (PF) (XYLOCAINE) 1 % injection (has no administration in time range)     Procedures Critical Care  ED Course and Medical Decision Making  I have reviewed the triage vital signs and the nursing notes.  Pertinent labs & imaging results that were available during my care of the patient were reviewed by me and considered in my medical decision making (see below for details).  58 year old male with history of cirrhosis who receives frequent IR paracentesis here with reaccumulation of ascitic fluid, protuberant abdomen with fluid wave.  He has mild diffuse tenderness, no fever, low concern for SBP.  Able to send him to IR for drainage.  Awaiting fluid analysis.  Signed out to oncoming provider at shift change.   Barth Kirks. Sedonia Small, MD [place] [email or web address]  Final Clinical Impressions(s) / ED Diagnoses     ICD-10-CM   1. Ascites  R18.8 US Paracentesis    US Paracentesis    ED Discharge Orders    None         Maudie Flakes, MD 02/11/19 [phone]

## 2019-02-12 DIAGNOSIS — Z992 Dependence on renal dialysis: Secondary | ICD-10-CM | POA: Diagnosis not present

## 2019-02-12 DIAGNOSIS — N2581 Secondary hyperparathyroidism of renal origin: Secondary | ICD-10-CM | POA: Diagnosis not present

## 2019-02-12 DIAGNOSIS — N186 End stage renal disease: Secondary | ICD-10-CM | POA: Diagnosis not present

## 2019-02-12 DIAGNOSIS — D631 Anemia in chronic kidney disease: Secondary | ICD-10-CM | POA: Diagnosis not present

## 2019-02-12 DIAGNOSIS — D509 Iron deficiency anemia, unspecified: Secondary | ICD-10-CM | POA: Diagnosis not present

## 2019-02-12 LAB — PATHOLOGIST SMEAR REVIEW

## 2019-02-14 DIAGNOSIS — Z992 Dependence on renal dialysis: Secondary | ICD-10-CM | POA: Diagnosis not present

## 2019-02-14 DIAGNOSIS — D631 Anemia in chronic kidney disease: Secondary | ICD-10-CM | POA: Diagnosis not present

## 2019-02-14 DIAGNOSIS — N2581 Secondary hyperparathyroidism of renal origin: Secondary | ICD-10-CM | POA: Diagnosis not present

## 2019-02-14 DIAGNOSIS — N186 End stage renal disease: Secondary | ICD-10-CM | POA: Diagnosis not present

## 2019-02-14 DIAGNOSIS — D509 Iron deficiency anemia, unspecified: Secondary | ICD-10-CM | POA: Diagnosis not present

## 2019-02-15 ENCOUNTER — Ambulatory Visit (HOSPITAL_COMMUNITY): Admission: RE | Admit: 2019-02-15 | Payer: Medicare Other | Source: Ambulatory Visit

## 2019-02-16 DIAGNOSIS — Z992 Dependence on renal dialysis: Secondary | ICD-10-CM | POA: Diagnosis not present

## 2019-02-16 DIAGNOSIS — D631 Anemia in chronic kidney disease: Secondary | ICD-10-CM | POA: Diagnosis not present

## 2019-02-16 DIAGNOSIS — N186 End stage renal disease: Secondary | ICD-10-CM | POA: Diagnosis not present

## 2019-02-16 DIAGNOSIS — N2581 Secondary hyperparathyroidism of renal origin: Secondary | ICD-10-CM | POA: Diagnosis not present

## 2019-02-16 DIAGNOSIS — D509 Iron deficiency anemia, unspecified: Secondary | ICD-10-CM | POA: Diagnosis not present

## 2019-02-16 LAB — CULTURE, BODY FLUID W GRAM STAIN -BOTTLE: Culture: NO GROWTH

## 2019-02-18 ENCOUNTER — Other Ambulatory Visit: Payer: Self-pay

## 2019-02-18 ENCOUNTER — Emergency Department (HOSPITAL_COMMUNITY)
Admission: EM | Admit: 2019-02-18 | Discharge: 2019-02-18 | Disposition: A | Discharge status: Home / Self Care | Payer: Medicare Other | Attending: Emergency Medicine | Admitting: Emergency Medicine

## 2019-02-18 ENCOUNTER — Encounter (HOSPITAL_COMMUNITY): Payer: Self-pay | Admitting: Emergency Medicine

## 2019-02-18 DIAGNOSIS — J449 Chronic obstructive pulmonary disease, unspecified: Secondary | ICD-10-CM | POA: Diagnosis not present

## 2019-02-18 DIAGNOSIS — F17210 Nicotine dependence, cigarettes, uncomplicated: Secondary | ICD-10-CM | POA: Insufficient documentation

## 2019-02-18 DIAGNOSIS — K7460 Unspecified cirrhosis of liver: Secondary | ICD-10-CM | POA: Insufficient documentation

## 2019-02-18 DIAGNOSIS — R188 Other ascites: Secondary | ICD-10-CM | POA: Diagnosis not present

## 2019-02-18 DIAGNOSIS — R609 Edema, unspecified: Secondary | ICD-10-CM | POA: Diagnosis not present

## 2019-02-18 DIAGNOSIS — I259 Chronic ischemic heart disease, unspecified: Secondary | ICD-10-CM | POA: Insufficient documentation

## 2019-02-18 DIAGNOSIS — Z992 Dependence on renal dialysis: Secondary | ICD-10-CM | POA: Diagnosis not present

## 2019-02-18 DIAGNOSIS — N186 End stage renal disease: Secondary | ICD-10-CM | POA: Diagnosis not present

## 2019-02-18 DIAGNOSIS — I10 Essential (primary) hypertension: Secondary | ICD-10-CM | POA: Diagnosis not present

## 2019-02-18 DIAGNOSIS — K310 Acute dilatation of stomach: Secondary | ICD-10-CM | POA: Diagnosis present

## 2019-02-18 DIAGNOSIS — I120 Hypertensive chronic kidney disease with stage 5 chronic kidney disease or end stage renal disease: Secondary | ICD-10-CM | POA: Insufficient documentation

## 2019-02-18 DIAGNOSIS — Z79899 Other long term (current) drug therapy: Secondary | ICD-10-CM | POA: Diagnosis not present

## 2019-02-18 DIAGNOSIS — I959 Hypotension, unspecified: Secondary | ICD-10-CM | POA: Diagnosis not present

## 2019-02-18 LAB — COMPREHENSIVE METABOLIC PANEL
ALT: 16 U/L (ref 0–44)
AST: 31 U/L (ref 15–41)
Albumin: 3.6 g/dL (ref 3.5–5.0)
Alkaline Phosphatase: 92 U/L (ref 38–126)
Anion gap: 14 (ref 5–15)
BUN: 25 mg/dL — ABNORMAL HIGH (ref 6–20)
CO2: 26 mmol/L (ref 22–32)
Calcium: 8.7 mg/dL — ABNORMAL LOW (ref 8.9–10.3)
Chloride: 99 mmol/L (ref 98–111)
Creatinine, Ser: 6.68 mg/dL — ABNORMAL HIGH (ref 0.61–1.24)
GFR calc Af Amer: 10 mL/min — ABNORMAL LOW (ref >60)
GFR calc non Af Amer: 8 mL/min — ABNORMAL LOW (ref >60)
Glucose, Bld: 91 mg/dL (ref 70–99)
Potassium: 5.2 mmol/L — ABNORMAL HIGH (ref 3.5–5.1)
Sodium: 139 mmol/L (ref 135–145)
Total Bilirubin: 1 mg/dL (ref 0.3–1.2)
Total Protein: 6.5 g/dL (ref 6.5–8.1)

## 2019-02-18 LAB — CBC
HCT: 31.2 % — ABNORMAL LOW (ref 39.0–52.0)
Hemoglobin: 10.8 g/dL — ABNORMAL LOW (ref 13.0–17.0)
MCH: 29.7 pg (ref 26.0–34.0)
MCHC: 34.6 g/dL (ref 30.0–36.0)
MCV: 85.7 fL (ref 80.0–100.0)
Platelets: 137 10*3/uL — ABNORMAL LOW (ref 150–400)
RBC: 3.64 MIL/uL — ABNORMAL LOW (ref 4.22–5.81)
RDW: 16.8 % — ABNORMAL HIGH (ref 11.5–15.5)
WBC: 7.3 10*3/uL (ref 4.0–10.5)
nRBC: 0 % (ref 0.0–0.2)

## 2019-02-18 MED ORDER — SODIUM CHLORIDE 0.9% FLUSH: 3.0000 mL | Freq: Once | INTRAVENOUS | Status: DC

## 2019-02-18 MED ORDER — OXYCODONE-ACETAMINOPHEN 5-325 MG PO TABS
1.0000 | ORAL_TABLET | Freq: Once | ORAL | Status: AC
  Administered 2019-02-18: 15:00:00 1 via ORAL
  Filled 2019-02-18: qty 1

## 2019-02-18 NOTE — ED Notes (Signed)
Pt. Refuse to put on a gown stating he never wears gowns when he comes to the hospital.

## 2019-02-18 NOTE — Discharge Instructions (Signed)
Tomorrow morning, please call the IR department to schedule your paracentesis.  They are expecting to fit you in their schedule.  If you develop fever, worsening abdominal pain, please return to the ER for reassessment.  Please also call your primary doctor for recheck in the clinic next week.

## 2019-02-18 NOTE — ED Notes (Signed)
Pt. Taking off electrode stickers while NT tried to get EKG. Pt. Stating he does not like that stuff sticking on him. After NT was able to capture EKG pt. Took secondary electrodes off.

## 2019-02-18 NOTE — ED Triage Notes (Signed)
Per EMS- pt arrives for reeval of ascites. Pt is a dialysis, last dialyzed Friday.

## 2019-02-18 NOTE — ED Provider Notes (Signed)
[facility] EMERGENCY DEPARTMENT Provider Note   CSN: [id number] Arrival date & time: 02/18/19  1307    History   Chief Complaint Chief Complaint  Patient presents with  . Ascites    HPI [name] is a 58 y.o. male.PMH ESRD, Hep C, CAD, cirhosis.  Presents to the ED with chief complaint of abdominal distention.  States recently had a therapeutic paracentesis around one week ago. He is requesting another one today. He states his abdominal fullness causes some shortness of breath. He additionally states the swelling causes some discomfort but denies new or different abdominal pain from his typical discomfort. He reports compliance with prophylactc SBP bactrim therapy. Patient denies any fever, chills, chest pain, nausea, vomiting.      HPI  Past Medical History:  Diagnosis Date  . Anemia   . Anxiety   . Arthritis    left shoulder  . Atherosclerosis of aorta ([place])   . Cardiomegaly   . Chest pain    DATE UNKNOWN, C/O PERIODICALLY  . Cocaine abuse ([place])   . COPD exacerbation ([place]) 08/17/2016  . Coronary artery disease    stent 02/22/17  . ESRD (end stage renal disease) on dialysis ([place])    "[name]; MWF" (07/04/2017)  . GERD (gastroesophageal reflux disease)    DATE UNKNOWN  . Hemorrhoids   . Hepatitis B, chronic ([place])   . Hepatitis C   . History of kidney stones   . Hyperkalemia   . Hypertension   . Kidney failure   . Metabolic bone disease    Patient denies  . Mitral stenosis   . Myocardial infarction ([place])   . Pneumonia   . Pulmonary edema   . Solitary rectal ulcer syndrome 07/2017   at flex sig for rectal bleeding  . Tubular adenoma of colon     Patient Active Problem List   Diagnosis Date Noted  . Volume overload 12/28/2018  . Sepsis ([place]) 09/12/2018  . Atherosclerosis of native coronary artery of native heart without angina pectoris 03/11/2018  . Benign neoplasm of cecum   . Benign neoplasm of ascending colon   . Benign  neoplasm of descending colon   . Benign neoplasm of rectum   . Paroxysmal atrial fibrillation ([place]) 01/23/2018  . Hx of colonic polyps 01/20/2018  . ESRD (end stage renal disease) ([place]) 11/21/2017  . GERD (gastroesophageal reflux disease) 11/16/2017  . Liver cirrhosis ([place]) 11/15/2017  . DNR (do not resuscitate)   . Palliative care by specialist   . Hyponatremia 11/04/2017  . SBP (spontaneous bacterial peritonitis) ([place]) 10/30/2017  . Liver disease, chronic 10/30/2017  . SOB (shortness of breath)   . Abdominal pain 10/28/2017  . Upper airway cough syndrome with flattening on f/v loop 10/13/17 c/w vcd 10/17/2017  . Elevated diaphragm 10/13/2017  . Ileus ([place]) 09/29/2017  . Malnutrition of moderate degree 09/29/2017  . Sinus congestion 09/03/2017  . Symptomatic anemia 09/02/2017  . Other cirrhosis of liver ([place]) 09/02/2017  . Left bundle branch block 09/02/2017  . Mitral stenosis 09/02/2017  . Hematochezia 07/15/2017  . Wide-complex tachycardia ([place])   . Endotracheally intubated   . ESRD on dialysis ([place]) 07/04/2017  . Acute respiratory failure with hypoxia ([place]) 06/18/2017  . CKD (chronic kidney disease) stage V requiring chronic dialysis ([place]) 06/18/2017  . History of Cocaine abuse ([place]) 06/18/2017  . Hypertension 06/18/2017  . Infection of AV graft for dialysis ([place]) 06/18/2017  . Anxiety 06/18/2017  . Anemia due to  chronic kidney disease 06/18/2017  . Atrial flutter with rapid ventricular response ([place]) 06/18/2017  . Personality disorder ([place]) 06/13/2017  . Cellulitis 06/12/2017  . Adjustment disorder with mixed anxiety and depressed mood 06/10/2017  . Suicidal ideation 06/10/2017  . Arm wound, left, sequela 06/10/2017  . Dyspnea on exertion 05/29/2017  . Tachycardia 05/29/2017  . Hyperkalemia 05/22/2017  . Acute metabolic encephalopathy   . Anemia 04/23/2017  . Ascites 04/23/2017  . COPD (chronic obstructive pulmonary disease) ([place]) 04/23/2017  . Acute on chronic  respiratory failure with hypoxia ([place]) 03/25/2017  . Arrhythmia 03/25/2017  . COPD GOLD 0 with flattening on inps f/v  09/27/2016  . Essential hypertension 09/27/2016  . Fluid overload 08/30/2016  . COPD exacerbation ([place]) 08/17/2016  . Hypertensive urgency 08/17/2016  . Respiratory failure ([place]) 08/17/2016  . Problem with dialysis access ([place]) 07/23/2016  . Chronic hepatitis B ([place]) 03/05/2014  . Chronic hepatitis C without hepatic coma ([place]) 03/05/2014  . Internal hemorrhoids with bleeding, swelling and itching 03/05/2014  . Thrombocytopenia ([place]) 03/05/2014  . Chest pain 02/27/2014  . Alcohol abuse 04/14/2009  . Cigarette smoker 04/14/2009  . GANGLION CYST 04/14/2009    Past Surgical History:  Procedure Laterality Date  . A/V FISTULAGRAM Left 05/26/2017   Procedure: A/V FISTULAGRAM;  Surgeon: Conrad Wise, MD;  Location: [facility] CV LAB;  Service: Cardiovascular;  Laterality: Left;  . A/V FISTULAGRAM Right 11/18/2017   Procedure: A/V FISTULAGRAM - Right Arm;  Surgeon: Elam Dutch, MD;  Location: [facility] CV LAB;  Service: Cardiovascular;  Laterality: Right;  . APPLICATION OF WOUND VAC Left 06/14/2017   Procedure: APPLICATION OF WOUND VAC;  Surgeon: Katha Cabal, MD;  Location: [facility] ORS;  Service: Vascular;  Laterality: Left;  . AV FISTULA PLACEMENT  2012   BELIEVED WAS PLACED IN JUNE  . AV FISTULA PLACEMENT Right 08/09/2017   Procedure: Creation Right arm ARTERIOVENOUS BRACHIOCEPOHALIC FISTULA;  Surgeon: Elam Dutch, MD;  [facility];  Service: Vascular;  Laterality: Right;  . AV FISTULA PLACEMENT Right 11/22/2017   Procedure: INSERTION OF ARTERIOVENOUS (AV) GORE-TEX GRAFT RIGHT UPPER ARM;  Surgeon: Elam Dutch, MD;  Location: [facility];  Service: Vascular;  Laterality: Right;  . BIOPSY  01/25/2018   Procedure: BIOPSY;  Surgeon: Jerene Bears, MD;  Location: [facility] ENDOSCOPY;  Service: Gastroenterology;;  . COLONOSCOPY    . COLONOSCOPY WITH PROPOFOL N/A  01/25/2018   Procedure: COLONOSCOPY WITH PROPOFOL;  Surgeon: Jerene Bears, MD;  Location: [facility];  Service: Gastroenterology;  Laterality: N/A;  . CORONARY STENT INTERVENTION N/A 02/22/2017   Procedure: CORONARY STENT INTERVENTION;  Surgeon: Nigel Mormon, MD;  Location: [facility] CV LAB;  Service: Cardiovascular;  Laterality: N/A;  . ESOPHAGOGASTRODUODENOSCOPY (EGD) WITH PROPOFOL N/A 01/25/2018   Procedure: ESOPHAGOGASTRODUODENOSCOPY (EGD) WITH PROPOFOL;  Surgeon: Jerene Bears, MD;  Location: [facility];  Service: Gastroenterology;  Laterality: N/A;  . FLEXIBLE SIGMOIDOSCOPY N/A 07/15/2017   Procedure: FLEXIBLE SIGMOIDOSCOPY;  Surgeon: Carol Ada, MD;  Location: [facility];  Service: Endoscopy;  Laterality: N/A;  . HEMORRHOID BANDING    . I&D EXTREMITY Left 06/01/2017   Procedure: IRRIGATION AND DEBRIDEMENT LEFT ARM HEMATOMA WITH LIGATION OF LEFT ARM AV FISTULA;  Surgeon: Elam Dutch, MD;  Location: [facility];  Service: Vascular;  Laterality: Left;  . I&D EXTREMITY Left 06/14/2017   Procedure: IRRIGATION AND DEBRIDEMENT EXTREMITY;  Surgeon: Katha Cabal, MD;  Location: [facility] ORS;  Service: Vascular;  Laterality: Left;  .  INSERTION OF DIALYSIS CATHETER  05/30/2017  . INSERTION OF DIALYSIS CATHETER N/A 05/30/2017   Procedure: INSERTION OF DIALYSIS CATHETER;  Surgeon: Elam Dutch, MD;  Location: [facility];  Service: Vascular;  Laterality: N/A;  . IR PARACENTESIS  08/30/2017  . IR PARACENTESIS  09/29/2017  . IR PARACENTESIS  10/28/2017  . IR PARACENTESIS  11/09/2017  . IR PARACENTESIS  11/16/2017  . IR PARACENTESIS  11/28/2017  . IR PARACENTESIS  12/01/2017  . IR PARACENTESIS  12/06/2017  . IR PARACENTESIS  01/03/2018  . IR PARACENTESIS  01/23/2018  . IR PARACENTESIS  02/07/2018  . IR PARACENTESIS  02/21/2018  . IR PARACENTESIS  03/06/2018  . IR PARACENTESIS  03/17/2018  . IR PARACENTESIS  04/04/2018  . IR PARACENTESIS  12/28/2018  . IR PARACENTESIS  01/08/2019  . IR PARACENTESIS   01/23/2019  . IR PARACENTESIS  02/01/2019  . IR RADIOLOGIST EVAL & MGMT  02/14/2018  . LEFT HEART CATH AND CORONARY ANGIOGRAPHY N/A 02/22/2017   Procedure: LEFT HEART CATH AND CORONARY ANGIOGRAPHY;  Surgeon: Nigel Mormon, MD;  Location: [facility] CV LAB;  Service: Cardiovascular;  Laterality: N/A;  . LIGATION OF ARTERIOVENOUS  FISTULA Left 09/16/8586   Procedure: Plication of Left Arm Arteriovenous Fistula;  Surgeon: Elam Dutch, MD;  Location: [facility];  Service: Vascular;  Laterality: Left;  . POLYPECTOMY    . POLYPECTOMY  01/25/2018   Procedure: POLYPECTOMY;  Surgeon: Jerene Bears, MD;  Location: [facility];  Service: Gastroenterology;;  . REVISON OF ARTERIOVENOUS FISTULA Left 11/11/7739   Procedure: PLICATION OF DISTAL ANEURYSMAL SEGEMENT OF LEFT UPPER ARM ARTERIOVENOUS FISTULA;  Surgeon: Elam Dutch, MD;  Location: [facility];  Service: Vascular;  Laterality: Left;  . REVISON OF ARTERIOVENOUS FISTULA Left 2/87/8676   Procedure: Plication of Left Upper Arm Fistula ;  Surgeon: Waynetta Sandy, MD;  Location: [facility];  Service: Vascular;  Laterality: Left;  . SKIN GRAFT SPLIT THICKNESS LEG / FOOT Left    SKIN GRAFT SPLIT THICKNESS LEFT ARM DONOR SITE: LEFT ANTERIOR THIGH  . SKIN SPLIT GRAFT Left 07/04/2017   Procedure: SKIN GRAFT SPLIT THICKNESS LEFT ARM DONOR SITE: LEFT ANTERIOR THIGH;  Surgeon: Elam Dutch, MD;  Location: [facility];  Service: Vascular;  Laterality: Left;  . THROMBECTOMY W/ EMBOLECTOMY Left 06/05/2017   Procedure: EXPLORATION OF LEFT ARM FOR BLEEDING; OVERSEWED PROXIMAL FISTULA;  Surgeon: Angelia Mould, MD;  Location: [facility];  Service: Vascular;  Laterality: Left;  . WOUND EXPLORATION Left 06/03/2017   Procedure: WOUND EXPLORATION WITH WOUND VAC APPLICATION TO LEFT ARM;  Surgeon: Angelia Mould, MD;  Location: [facility];  Service: Vascular;  Laterality: Left;        Home Medications    Prior to Admission medications   Medication Sig Start  Date End Date Taking? Authorizing Provider  albuterol (VENTOLIN HFA) 108 (90 Base) MCG/ACT inhaler Inhale 2 puffs into the lungs every 6 (six) hours as needed for wheezing or shortness of breath.    [provider]  hydrALAZINE (APRESOLINE) 100 MG tablet Take 100 mg by mouth daily. 02/13/19   [provider]  hydrOXYzine (ATARAX/VISTARIL) 25 MG tablet Take 25 mg by mouth 2 (two) times daily as needed. 02/14/19   [provider]  lactulose (CHRONULAC) 10 GM/15ML solution TAKE 45 MLS BY MOUTH 2 TIMES DAILY. Patient taking differently: Take 45 g by mouth 2 (two) times daily.  01/30/19   Pyrtle, Lajuan Lines, MD  metoprolol tartrate (  LOPRESSOR) 25 MG tablet Take 1 tablet (25 mg total) by mouth 2 (two) times daily. 03/11/18   Geradine Girt, DO  montelukast (SINGULAIR) 10 MG tablet Take 10 mg by mouth daily.    [provider]  ondansetron (ZOFRAN) 8 MG tablet Take 8 mg by mouth every 8 (eight) hours as needed for nausea or vomiting.     [provider]  pantoprazole (PROTONIX) 40 MG tablet Take 1 tablet (40 mg total) by mouth See admin instructions. Take one tablet (40 mg) by mouth in the morning on Sunday, Tuesday, Thursday, Saturday, take one tablet (40 mg) after dialysis on Monday, Wednesday, Friday. May also take one tablet (40 mg) in the evening as needed for acid reflux. 12/26/18   Esterwood, Amy S, PA-C  sevelamer carbonate (RENVELA) 800 MG tablet Take 4 tablets with meals  And 2 tablets twice daily with snacks Patient taking differently: Take 1,600-3,200 mg by mouth See admin instructions. Take 3200 mg three time a day with meals, and 1600 mg twice daily with snacks 01/22/18   Patrecia Pour, MD  sulfamethoxazole-trimethoprim (BACTRIM DS) 800-160 MG tablet Take 1 tablet by mouth daily. 12/30/18   Geradine Girt, DO  SYMBICORT 160-4.5 MCG/ACT inhaler Inhale 2 puffs into the lungs 2 (two) times a day. 11/30/18   [provider]  zolpidem (AMBIEN) 5 MG tablet Take  5 mg by mouth at bedtime. 12/08/18   [provider]    Family History Family History  Problem Relation Age of Onset  . Heart disease Mother   . Lung cancer Mother   . Heart disease Father   . Malignant hyperthermia Father   . COPD Father   . Throat cancer Sister   . Esophageal cancer Sister   . Hypertension Other   . COPD Other   . Colon cancer Neg Hx   . Colon polyps Neg Hx   . Rectal cancer Neg Hx   . Stomach cancer Neg Hx     Social History Social History   Tobacco Use  . Smoking status: Current Every Day Smoker    Packs/day: 0.50    Years: 43.00    Pack years: 21.50    Types: Cigarettes    Start date: 08/13/1973  . Smokeless tobacco: Never Used  Substance Use Topics  . Alcohol use: Not Currently    Frequency: Never    Comment: quit drinking in 2017  . Drug use: Yes    Types: Marijuana, Cocaine    Comment: reports using once every 3 months     Allergies   Morphine and related, Aspirin, Clonidine derivatives, Tramadol, and Tylenol [acetaminophen]   Review of Systems Review of Systems  Constitutional: Negative for chills and fever.  HENT: Negative for ear pain and sore throat.   Eyes: Negative for pain and visual disturbance.  Respiratory: Negative for cough and shortness of breath.   Cardiovascular: Negative for chest pain and palpitations.  Gastrointestinal: Positive for abdominal distention. Negative for abdominal pain and vomiting.  Genitourinary: Negative for dysuria and hematuria.  Musculoskeletal: Negative for arthralgias and back pain.  Skin: Negative for color change and rash.  Neurological: Negative for seizures and syncope.  All other systems reviewed and are negative.    Physical Exam Updated Vital Signs BP (!) 172/75 (BP Location: Left Arm)   Pulse 61   Temp 98.2 F (36.8 C) (Oral)   Resp 20   SpO2 100%   Physical Exam Vitals signs and nursing note  reviewed.  Constitutional:      Appearance: He is well-developed.  HENT:      Head: Normocephalic and atraumatic.  Eyes:     Conjunctiva/sclera: Conjunctivae normal.  Neck:     Musculoskeletal: Neck supple.  Cardiovascular:     Rate and Rhythm: Normal rate and regular rhythm.     Heart sounds: No murmur.  Pulmonary:     Effort: Pulmonary effort is normal. No respiratory distress.     Breath sounds: Normal breath sounds.  Abdominal:     General: Bowel sounds are normal. There is distension.     Tenderness: There is no abdominal tenderness.  Skin:    General: Skin is warm and dry.  Neurological:     Mental Status: He is alert.      ED Treatments / Results  Labs (all labs ordered are listed, but only abnormal results are displayed) Labs Reviewed  COMPREHENSIVE METABOLIC PANEL - Abnormal; Notable for the following components:      Result Value   Potassium 5.2 (*)    BUN 25 (*)    Creatinine, Ser 6.68 (*)    Calcium 8.7 (*)    GFR calc non Af Amer 8 (*)    GFR calc Af Amer 10 (*)    All other components within normal limits  CBC - Abnormal; Notable for the following components:   RBC 3.64 (*)    Hemoglobin 10.8 (*)    HCT 31.2 (*)    RDW 16.8 (*)    Platelets 137 (*)    All other components within normal limits    EKG EKG Interpretation  Date/Time:  Sunday February 18 2019 13:57:40 EDT Ventricular Rate:  102 PR Interval:    QRS Duration: 168 QT Interval:  484 QTC Calculation: 589 R Axis:   -147 Text Interpretation:  Atrial fibrillation Consider left ventricular hypertrophy Borderline T abnormalities, lateral leads Prolonged QT interval No STEMI  Confirmed by Long, Joshua ([id number]) on 02/19/2019 10:37:55 AM   Radiology No results found.  Procedures Procedures (including critical care time)  Medications Ordered in ED Medications  oxyCODONE-acetaminophen (PERCOCET/ROXICET) 5-325 MG per tablet 1 tablet (1 tablet Oral Given 02/18/19 1445)     Initial Impression / Assessment and Plan / ED Course  I have reviewed the triage vital signs  and the nursing notes.  Pertinent labs & imaging results that were available during my care of the patient were reviewed by me and considered in my medical decision making (see chart for details).        58  year old gentleman who presented to the ER with complaints of abdominal distention requesting paracentesis.  On exam noted abdominal distention without any tenderness. No fever, no leukocytosis. Highly doubt SBP. Recent tap was clean without evidence for SBP.  Vital signs stable, lungs clear, no visible tachypnea or increased work of breathing.  I contacted the IR physician on call who did not have availability this afternoon but would make arrangements for patient to come back tomorrow for this procedure. He goes to dialysis MWF.  I instructed him to keep his dailysis slot for tomorrow morning and get the tap done in the afternoon. Patient verbalized understanding of this plan and was in agreement.  After the discussed management above, the patient was determined to be safe for discharge.  The patient was in agreement with this plan and all questions regarding their care were answered.  ED return precautions were discussed and the patient will return to the  ED with any significant worsening of condition.    Final Clinical Impressions(s) / ED Diagnoses   Final diagnoses:  Other ascites    ED Discharge Orders    None       Lucrezia Starch, MD 02/19/19 1152

## 2019-02-19 ENCOUNTER — Other Ambulatory Visit (HOSPITAL_COMMUNITY): Payer: Self-pay | Admitting: Internal Medicine

## 2019-02-19 ENCOUNTER — Emergency Department (HOSPITAL_COMMUNITY): Payer: Medicare Other

## 2019-02-19 ENCOUNTER — Other Ambulatory Visit: Payer: Self-pay

## 2019-02-19 ENCOUNTER — Ambulatory Visit (HOSPITAL_COMMUNITY): Admission: RE | Admit: 2019-02-19 | Payer: Medicare Other | Source: Ambulatory Visit

## 2019-02-19 ENCOUNTER — Encounter (HOSPITAL_COMMUNITY): Payer: Self-pay | Admitting: *Deleted

## 2019-02-19 ENCOUNTER — Emergency Department (HOSPITAL_COMMUNITY)
Admission: EM | Admit: 2019-02-19 | Discharge: 2019-02-19 | Disposition: A | Discharge status: Home / Self Care | Payer: Medicare Other | Attending: Emergency Medicine | Admitting: Emergency Medicine

## 2019-02-19 DIAGNOSIS — E875 Hyperkalemia: Secondary | ICD-10-CM | POA: Insufficient documentation

## 2019-02-19 DIAGNOSIS — F17210 Nicotine dependence, cigarettes, uncomplicated: Secondary | ICD-10-CM | POA: Insufficient documentation

## 2019-02-19 DIAGNOSIS — J449 Chronic obstructive pulmonary disease, unspecified: Secondary | ICD-10-CM | POA: Insufficient documentation

## 2019-02-19 DIAGNOSIS — K7290 Hepatic failure, unspecified without coma: Secondary | ICD-10-CM | POA: Diagnosis not present

## 2019-02-19 DIAGNOSIS — R8569 Abnormal cytological findings in specimens from other digestive organs and abdominal cavity: Secondary | ICD-10-CM | POA: Diagnosis not present

## 2019-02-19 DIAGNOSIS — I10 Essential (primary) hypertension: Secondary | ICD-10-CM | POA: Diagnosis not present

## 2019-02-19 DIAGNOSIS — I120 Hypertensive chronic kidney disease with stage 5 chronic kidney disease or end stage renal disease: Secondary | ICD-10-CM | POA: Diagnosis not present

## 2019-02-19 DIAGNOSIS — K7031 Alcoholic cirrhosis of liver with ascites: Secondary | ICD-10-CM | POA: Diagnosis not present

## 2019-02-19 DIAGNOSIS — R188 Other ascites: Secondary | ICD-10-CM

## 2019-02-19 DIAGNOSIS — K7210 Chronic hepatic failure without coma: Secondary | ICD-10-CM

## 2019-02-19 DIAGNOSIS — Z992 Dependence on renal dialysis: Secondary | ICD-10-CM | POA: Diagnosis not present

## 2019-02-19 DIAGNOSIS — N186 End stage renal disease: Secondary | ICD-10-CM | POA: Insufficient documentation

## 2019-02-19 DIAGNOSIS — Z79899 Other long term (current) drug therapy: Secondary | ICD-10-CM | POA: Insufficient documentation

## 2019-02-19 DIAGNOSIS — K7460 Unspecified cirrhosis of liver: Secondary | ICD-10-CM

## 2019-02-19 DIAGNOSIS — R140 Abdominal distension (gaseous): Secondary | ICD-10-CM | POA: Diagnosis present

## 2019-02-19 HISTORY — PX: IR PARACENTESIS: IMG2679

## 2019-02-19 LAB — CBC WITH DIFFERENTIAL/PLATELET
Abs Immature Granulocytes: 0.04 10*3/uL (ref 0.00–0.07)
Basophils Absolute: 0.1 10*3/uL (ref 0.0–0.1)
Basophils Relative: 1 %
Eosinophils Absolute: 0.1 10*3/uL (ref 0.0–0.5)
Eosinophils Relative: 1 %
HCT: 32.2 % — ABNORMAL LOW (ref 39.0–52.0)
Hemoglobin: 11.1 g/dL — ABNORMAL LOW (ref 13.0–17.0)
Immature Granulocytes: 0 %
Lymphocytes Relative: 13 %
Lymphs Abs: 1.2 10*3/uL (ref 0.7–4.0)
MCH: 29.7 pg (ref 26.0–34.0)
MCHC: 34.5 g/dL (ref 30.0–36.0)
MCV: 86.1 fL (ref 80.0–100.0)
Monocytes Absolute: 0.8 10*3/uL (ref 0.1–1.0)
Monocytes Relative: 8 %
Neutro Abs: 7.2 10*3/uL (ref 1.7–7.7)
Neutrophils Relative %: 77 %
Platelets: 147 10*3/uL — ABNORMAL LOW (ref 150–400)
RBC: 3.74 MIL/uL — ABNORMAL LOW (ref 4.22–5.81)
RDW: 16.6 % — ABNORMAL HIGH (ref 11.5–15.5)
WBC: 9.3 10*3/uL (ref 4.0–10.5)
nRBC: 0 % (ref 0.0–0.2)

## 2019-02-19 LAB — HEPATIC FUNCTION PANEL
ALT: 15 U/L (ref 0–44)
AST: 28 U/L (ref 15–41)
Albumin: 3.8 g/dL (ref 3.5–5.0)
Alkaline Phosphatase: 105 U/L (ref 38–126)
Bilirubin, Direct: 0.3 mg/dL — ABNORMAL HIGH (ref 0.0–0.2)
Indirect Bilirubin: 0.5 mg/dL (ref 0.3–0.9)
Total Bilirubin: 0.8 mg/dL (ref 0.3–1.2)
Total Protein: 6.8 g/dL (ref 6.5–8.1)

## 2019-02-19 LAB — BODY FLUID CELL COUNT WITH DIFFERENTIAL
Eos, Fluid: 0 %
Lymphs, Fluid: 23 %
Monocyte-Macrophage-Serous Fluid: 73 % (ref 50–90)
Neutrophil Count, Fluid: 4 % (ref 0–25)
Total Nucleated Cell Count, Fluid: 83 cu mm (ref 0–1000)

## 2019-02-19 LAB — BASIC METABOLIC PANEL
Anion gap: 17 — ABNORMAL HIGH (ref 5–15)
BUN: 32 mg/dL — ABNORMAL HIGH (ref 6–20)
CO2: 23 mmol/L (ref 22–32)
Calcium: 9 mg/dL (ref 8.9–10.3)
Chloride: 98 mmol/L (ref 98–111)
Creatinine, Ser: 8.14 mg/dL — ABNORMAL HIGH (ref 0.61–1.24)
GFR calc Af Amer: 8 mL/min — ABNORMAL LOW (ref >60)
GFR calc non Af Amer: 7 mL/min — ABNORMAL LOW (ref >60)
Glucose, Bld: 99 mg/dL (ref 70–99)
Potassium: 5.7 mmol/L — ABNORMAL HIGH (ref 3.5–5.1)
Sodium: 138 mmol/L (ref 135–145)

## 2019-02-19 LAB — GRAM STAIN

## 2019-02-19 LAB — PROTIME-INR
INR: 1.1 (ref 0.8–1.2)
Prothrombin Time: 14.5 seconds (ref 11.4–15.2)

## 2019-02-19 IMAGING — DX DG CHEST 1V PORT
1 series · 1 of 1 positions shown · non-contrast
Comparison: July 14, 2017

CLINICAL DATA: Chest pain.  Renal failure

EXAM:
PORTABLE CHEST 1 VIEW

[chest ap]
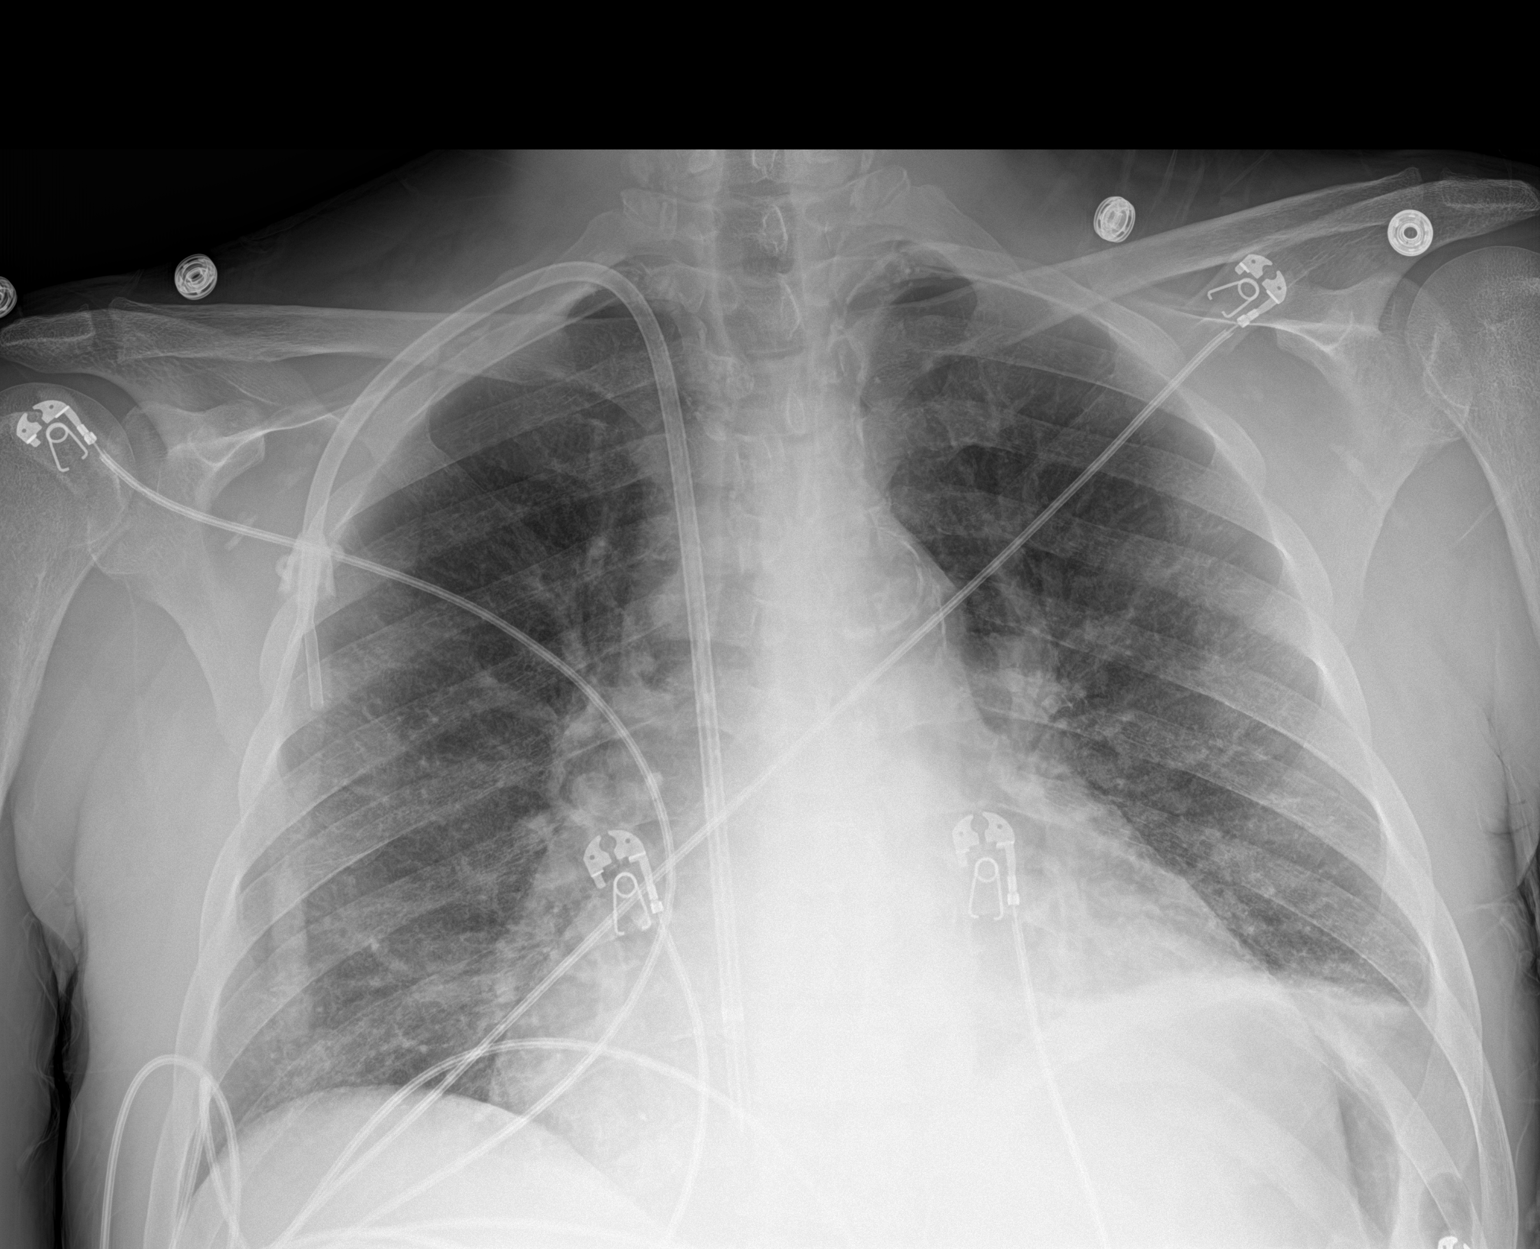

[1 of 1 positions shown; findings below may reference images not displayed]

FINDINGS: Central catheter tip is in the right atrium. No pneumothorax. There
is a small left pleural effusion with mild left base atelectasis.
Lungs elsewhere are clear. Heart is enlarged with mild pulmonary
venous hypertension. There is aortic atherosclerosis. No adenopathy.
No bone lesions. There is calcification in both carotid arteries.
There is also calcification in the left and right subclavian
arteries.
IMPRESSION: Central catheter tip in right atrium. No pneumothorax. Small left
pleural effusion with left base atelectasis. No frank edema or
consolidation.

There is underlying pulmonary vascular congestion. There is aortic
atherosclerosis as well as extensive carotid and subclavian artery
calcification.

Aortic Atherosclerosis ([id number]-Q10.0).

## 2019-02-19 MED ORDER — LIDOCAINE HCL 1 % IJ SOLN
INTRAMUSCULAR | Status: AC
  Filled 2019-02-19: qty 20

## 2019-02-19 MED ORDER — SODIUM ZIRCONIUM CYCLOSILICATE 10 G PO PACK
10.0000 g | PACK | Freq: Once | ORAL | Status: AC
  Administered 2019-02-19: 15:00:00 10 g via ORAL
  Filled 2019-02-19: qty 1

## 2019-02-19 MED ORDER — LIDOCAINE HCL 1 % IJ SOLN
INTRAMUSCULAR | Status: AC | PRN
  Administered 2019-02-19: 10 mL

## 2019-02-19 MED ORDER — CALCIUM GLUCONATE 10 % IV SOLN
1.0000 g | Freq: Once | INTRAVENOUS | Status: AC
  Administered 2019-02-19: 15:00:00 1 g via INTRAVENOUS
  Filled 2019-02-19: qty 10

## 2019-02-19 NOTE — ED Notes (Signed)
Paged IR

## 2019-02-19 NOTE — ED Triage Notes (Signed)
PT was brought here from dialysis (which he did not complete) because he states he needs a paracentesis.  States he was seen here yesterday but "they didn't have anyone to do the paracentesis, so they sent me home".  Last had dialysis on Fri.

## 2019-02-19 NOTE — ED Notes (Signed)
Pt refusing to allow this RN to complete EKG, Lab draws, and assessment. RN informed MD

## 2019-02-19 NOTE — ED Provider Notes (Signed)
[facility] EMERGENCY DEPARTMENT Provider Note   CSN: [id number] Arrival date & time: 02/19/19  1884    History   Chief Complaint Chief Complaint  Patient presents with   Abdominal Pain    HPI Mithran Strike is a 58 y.o. male.     The history is provided by the patient.  Abdominal Pain Pain location:  Generalized Pain quality: bloating   Pain radiates to:  Does not radiate Pain severity:  Mild Onset quality:  Gradual Timing:  Intermittent Progression:  Waxing and waning Chronicity:  Chronic Context comment:  Hx of cirrohsis, ESRD goes to dialysis and IR for paracentesis every several weeks. Refused dialysis today, states he needs paracentesis because his abdomen in full. Last done last week. Supposed to talk to IR about a TIPS but hasnt. Relieved by:  Nothing Worsened by:  Nothing Associated symptoms: no chest pain, no chills, no constipation, no cough, no dysuria, no fever, no hematuria, no shortness of breath, no sore throat and no vomiting     Past Medical History:  Diagnosis Date   Anemia    Anxiety    Arthritis    left shoulder   Atherosclerosis of aorta (HCC)    Cardiomegaly    Chest pain    DATE UNKNOWN, C/O PERIODICALLY   Cocaine abuse (HCC)    COPD exacerbation ([place]) 08/17/2016   Coronary artery disease    stent 02/22/17   ESRD (end stage renal disease) on dialysis ([place])    "[name]; MWF" (07/04/2017)   GERD (gastroesophageal reflux disease)    DATE UNKNOWN   Hemorrhoids    Hepatitis B, chronic (HCC)    Hepatitis C    History of kidney stones    Hyperkalemia    Hypertension    Kidney failure    Metabolic bone disease    Patient denies   Mitral stenosis    Myocardial infarction [facility])    Pneumonia    Pulmonary edema    Solitary rectal ulcer syndrome 07/2017   at flex sig for rectal bleeding   Tubular adenoma of colon     Patient Active Problem List   Diagnosis Date Noted   Volume  overload 12/28/2018   Sepsis ([place]) 09/12/2018   Atherosclerosis of native coronary artery of native heart without angina pectoris 03/11/2018   Benign neoplasm of cecum    Benign neoplasm of ascending colon    Benign neoplasm of descending colon    Benign neoplasm of rectum    Paroxysmal atrial fibrillation ([place]) 01/23/2018   Hx of colonic polyps 01/20/2018   ESRD (end stage renal disease) ([place]) 11/21/2017   GERD (gastroesophageal reflux disease) 11/16/2017   Liver cirrhosis ([place]) 11/15/2017   DNR (do not resuscitate)    Palliative care by specialist    Hyponatremia 11/04/2017   SBP (spontaneous bacterial peritonitis) ([place]) 10/30/2017   Liver disease, chronic 10/30/2017   SOB (shortness of breath)    Abdominal pain 10/28/2017   Upper airway cough syndrome with flattening on f/v loop 10/13/17 c/w vcd 10/17/2017   Elevated diaphragm 10/13/2017   Ileus ([place]) 09/29/2017   Malnutrition of moderate degree 09/29/2017   Sinus congestion 09/03/2017   Symptomatic anemia 09/02/2017   Other cirrhosis of liver ([place]) 09/02/2017   Left bundle branch block 09/02/2017   Mitral stenosis 09/02/2017   Hematochezia 07/15/2017   Wide-complex tachycardia ([place])    Endotracheally intubated    ESRD on dialysis ([place]) 07/04/2017   Acute respiratory failure with hypoxia (  [place]) 06/18/2017   CKD (chronic kidney disease) stage V requiring chronic dialysis ([place]) 06/18/2017   History of Cocaine abuse ([place]) 06/18/2017   Hypertension 06/18/2017   Infection of AV graft for dialysis ([place]) 06/18/2017   Anxiety 06/18/2017   Anemia due to chronic kidney disease 06/18/2017   Atrial flutter with rapid ventricular response ([place]) 06/18/2017   Personality disorder ([place]) 06/13/2017   Cellulitis 06/12/2017   Adjustment disorder with mixed anxiety and depressed mood 06/10/2017   Suicidal ideation 06/10/2017   Arm wound, left, sequela 06/10/2017   Dyspnea on exertion 05/29/2017     Tachycardia 05/29/2017   Hyperkalemia 71/69/6789   Acute metabolic encephalopathy    Anemia 04/23/2017   Ascites 04/23/2017   COPD (chronic obstructive pulmonary disease) ([place]) 04/23/2017   Acute on chronic respiratory failure with hypoxia ([place]) 03/25/2017   Arrhythmia 03/25/2017   COPD GOLD 0 with flattening on inps f/v  09/27/2016   Essential hypertension 09/27/2016   Fluid overload 08/30/2016   COPD exacerbation ([place]) 08/17/2016   Hypertensive urgency 08/17/2016   Respiratory failure ([place]) 08/17/2016   Problem with dialysis access ([place]) 07/23/2016   Chronic hepatitis B ([place]) 03/05/2014   Chronic hepatitis C without hepatic coma ([place]) 03/05/2014   Internal hemorrhoids with bleeding, swelling and itching 03/05/2014   Thrombocytopenia ([place]) 03/05/2014   Chest pain 02/27/2014   Alcohol abuse 04/14/2009   Cigarette smoker 04/14/2009   GANGLION CYST 04/14/2009    Past Surgical History:  Procedure Laterality Date   A/V FISTULAGRAM Left 05/26/2017   Procedure: A/V FISTULAGRAM;  Surgeon: Conrad Palestine, MD;  Location: [place] [facility];  Service: Cardiovascular;  Laterality: Left;   A/V FISTULAGRAM Right 11/18/2017   Procedure: A/V FISTULAGRAM - Right Arm;  Surgeon: Elam Dutch, MD;  Location: [place] [facility];  Service: Cardiovascular;  Laterality: Right;   APPLICATION OF WOUND VAC Left 06/14/2017   Procedure: APPLICATION OF WOUND VAC;  Surgeon: Katha Cabal, MD;  Location: [facility] ORS;  Service: Vascular;  Laterality: Left;   AV FISTULA PLACEMENT  2012   BELIEVED WAS PLACED IN JUNE   AV FISTULA PLACEMENT Right 08/09/2017   Procedure: Creation Right arm ARTERIOVENOUS BRACHIOCEPOHALIC FISTULA;  Surgeon: Elam Dutch, MD;  Location: [facility] OR;  Service: Vascular;  Laterality: Right;   AV FISTULA PLACEMENT Right 11/22/2017   Procedure: INSERTION OF ARTERIOVENOUS (AV) GORE-TEX GRAFT RIGHT UPPER ARM;  Surgeon: Elam Dutch, MD;  Location: [place];  Service: Vascular;  Laterality: Right;   BIOPSY  01/25/2018   Procedure: BIOPSY;  Surgeon: Jerene Bears, MD;  Location: [place];  Service: Gastroenterology;;   COLONOSCOPY     COLONOSCOPY WITH PROPOFOL N/A 01/25/2018   Procedure: COLONOSCOPY WITH PROPOFOL;  Surgeon: Jerene Bears, MD;  Location: [place];  Service: Gastroenterology;  Laterality: N/A;   CORONARY STENT INTERVENTION N/A 02/22/2017   Procedure: CORONARY STENT INTERVENTION;  Surgeon: Nigel Mormon, MD;  Location: [place] [facility];  Service: Cardiovascular;  Laterality: N/A;   ESOPHAGOGASTRODUODENOSCOPY (EGD) WITH PROPOFOL N/A 01/25/2018   Procedure: ESOPHAGOGASTRODUODENOSCOPY (EGD) WITH PROPOFOL;  Surgeon: Jerene Bears, MD;  Location: [place];  Service: Gastroenterology;  Laterality: N/A;   FLEXIBLE SIGMOIDOSCOPY N/A 07/15/2017   Procedure: FLEXIBLE SIGMOIDOSCOPY;  Surgeon: Carol Ada, MD;  Location: [place];  Service: Endoscopy;  Laterality: N/A;   HEMORRHOID BANDING     I&D EXTREMITY Left 06/01/2017   Procedure: IRRIGATION AND DEBRIDEMENT LEFT ARM HEMATOMA WITH LIGATION OF LEFT ARM  AV FISTULA;  Surgeon: Elam Dutch, MD;  Location: [place];  Service: Vascular;  Laterality: Left;   I&D EXTREMITY Left 06/14/2017   Procedure: IRRIGATION AND DEBRIDEMENT EXTREMITY;  Surgeon: Katha Cabal, MD;  Location: [facility];  Service: Vascular;  Laterality: Left;   INSERTION OF DIALYSIS CATHETER  05/30/2017   INSERTION OF DIALYSIS CATHETER N/A 05/30/2017   Procedure: INSERTION OF DIALYSIS CATHETER;  Surgeon: Elam Dutch, MD;  Location: [place];  Service: Vascular;  Laterality: N/A;   IR PARACENTESIS  08/30/2017   IR PARACENTESIS  09/29/2017   IR PARACENTESIS  10/28/2017   IR PARACENTESIS  11/09/2017   IR PARACENTESIS  11/16/2017   IR PARACENTESIS  11/28/2017   IR PARACENTESIS  12/01/2017   IR PARACENTESIS  12/06/2017   IR PARACENTESIS  01/03/2018   IR PARACENTESIS  01/23/2018   IR  PARACENTESIS  02/07/2018   IR PARACENTESIS  02/21/2018   IR PARACENTESIS  03/06/2018   IR PARACENTESIS  03/17/2018   IR PARACENTESIS  04/04/2018   IR PARACENTESIS  12/28/2018   IR PARACENTESIS  01/08/2019   IR PARACENTESIS  01/23/2019   IR PARACENTESIS  02/01/2019   IR RADIOLOGIST EVAL & MGMT  02/14/2018   LEFT HEART CATH AND CORONARY ANGIOGRAPHY N/A 02/22/2017   Procedure: LEFT HEART CATH AND CORONARY ANGIOGRAPHY;  Surgeon: Nigel Mormon, MD;  Location: [place] [facility];  Service: Cardiovascular;  Laterality: N/A;   LIGATION OF ARTERIOVENOUS  FISTULA Left 11/14/3873   Procedure: Plication of Left Arm Arteriovenous Fistula;  Surgeon: Elam Dutch, MD;  Location: [facility] OR;  Service: Vascular;  Laterality: Left;   POLYPECTOMY     POLYPECTOMY  01/25/2018   Procedure: POLYPECTOMY;  Surgeon: Jerene Bears, MD;  Location: [place];  Service: Gastroenterology;;   REVISON OF ARTERIOVENOUS FISTULA Left 6/43/3295   Procedure: PLICATION OF DISTAL ANEURYSMAL SEGEMENT OF LEFT UPPER ARM ARTERIOVENOUS FISTULA;  Surgeon: Elam Dutch, MD;  Location: [place];  Service: Vascular;  Laterality: Left;   REVISON OF ARTERIOVENOUS FISTULA Left 1/88/4166   Procedure: Plication of Left Upper Arm Fistula ;  Surgeon: Waynetta Sandy, MD;  Location: [place];  Service: Vascular;  Laterality: Left;   SKIN GRAFT SPLIT THICKNESS LEG / FOOT Left    SKIN GRAFT SPLIT THICKNESS LEFT ARM DONOR SITE: LEFT ANTERIOR THIGH   SKIN SPLIT GRAFT Left 07/04/2017   Procedure: SKIN GRAFT SPLIT THICKNESS LEFT ARM DONOR SITE: LEFT ANTERIOR THIGH;  Surgeon: Elam Dutch, MD;  Location: [place];  Service: Vascular;  Laterality: Left;   THROMBECTOMY W/ EMBOLECTOMY Left 06/05/2017   Procedure: EXPLORATION OF LEFT ARM FOR BLEEDING; OVERSEWED PROXIMAL FISTULA;  Surgeon: Angelia Mould, MD;  Location: [place];  Service: Vascular;  Laterality: Left;   WOUND EXPLORATION Left 06/03/2017   Procedure: WOUND  EXPLORATION WITH WOUND VAC APPLICATION TO LEFT ARM;  Surgeon: Angelia Mould, MD;  Location: [facility] OR;  Service: Vascular;  Laterality: Left;        Home Medications    Prior to Admission medications   Medication Sig Start Date End Date Taking? Authorizing Provider  albuterol (VENTOLIN HFA) 108 (90 Base) MCG/ACT inhaler Inhale 2 puffs into the lungs every 6 (six) hours as needed for wheezing or shortness of breath.   Yes [provider]  hydrALAZINE (APRESOLINE) 100 MG tablet Take 100 mg by mouth daily. 02/13/19  Yes [provider]  hydrOXYzine (ATARAX/VISTARIL) 25 MG tablet Take 25 mg by mouth  2 (two) times daily as needed. 02/14/19  Yes [provider]  lactulose (CHRONULAC) 10 GM/15ML solution TAKE 45 MLS BY MOUTH 2 TIMES DAILY. Patient taking differently: Take 45 g by mouth 2 (two) times daily.  01/30/19  Yes Pyrtle, Lajuan Lines, MD  metoprolol tartrate (LOPRESSOR) 25 MG tablet Take 1 tablet (25 mg total) by mouth 2 (two) times daily. 03/11/18  Yes Vann, Jessica U, DO  montelukast (SINGULAIR) 10 MG tablet Take 10 mg by mouth daily.   Yes [provider]  ondansetron (ZOFRAN) 8 MG tablet Take 8 mg by mouth every 8 (eight) hours as needed for nausea or vomiting.    Yes [provider]  pantoprazole (PROTONIX) 40 MG tablet Take 1 tablet (40 mg total) by mouth See admin instructions. Take one tablet (40 mg) by mouth in the morning on Sunday, Tuesday, Thursday, Saturday, take one tablet (40 mg) after dialysis on Monday, Wednesday, Friday. May also take one tablet (40 mg) in the evening as needed for acid reflux. 12/26/18  Yes Esterwood, Amy S, PA-C  sevelamer carbonate (RENVELA) 800 MG tablet Take 4 tablets with meals  And 2 tablets twice daily with snacks Patient taking differently: Take 1,600-3,200 mg by mouth See admin instructions. Take 3200 mg three time a day with meals, and 1600 mg twice daily with snacks 01/22/18  Yes Patrecia Pour, MD    sulfamethoxazole-trimethoprim (BACTRIM DS) 800-160 MG tablet Take 1 tablet by mouth daily. 12/30/18  Yes Vann, Jessica U, DO  SYMBICORT 160-4.5 MCG/ACT inhaler Inhale 2 puffs into the lungs 2 (two) times a day. 11/30/18  Yes [provider]  zolpidem (AMBIEN) 5 MG tablet Take 5 mg by mouth at bedtime. 12/08/18  Yes [provider]    Family History Family History  Problem Relation Age of Onset   Heart disease Mother    Lung cancer Mother    Heart disease Father    Malignant hyperthermia Father    COPD Father    Throat cancer Sister    Esophageal cancer Sister    Hypertension Other    COPD Other    Colon cancer Neg Hx    Colon polyps Neg Hx    Rectal cancer Neg Hx    Stomach cancer Neg Hx     Social History Social History   Tobacco Use   Smoking status: Current Every Day Smoker    Packs/day: 0.50    Years: 43.00    Pack years: 21.50    Types: Cigarettes    Start date: 08/13/1973   Smokeless tobacco: Never Used  Substance Use Topics   Alcohol use: Not Currently    Frequency: Never    Comment: quit drinking in 2017   Drug use: Yes    Types: Marijuana, Cocaine    Comment: reports using once every 3 months     Allergies   Morphine and related, Aspirin, Clonidine derivatives, Tramadol, and Tylenol [acetaminophen]   Review of Systems Review of Systems  Constitutional: Negative for chills and fever.  HENT: Negative for ear pain and sore throat.   Eyes: Negative for pain and visual disturbance.  Respiratory: Negative for cough and shortness of breath.   Cardiovascular: Negative for chest pain and palpitations.  Gastrointestinal: Positive for abdominal distention and abdominal pain. Negative for constipation and vomiting.  Genitourinary: Negative for dysuria and hematuria.  Musculoskeletal: Negative for arthralgias and back pain.  Skin: Negative for color change and rash.  Neurological: Negative for seizures and syncope.  All other  systems reviewed and are negative.    Physical Exam Updated Vital Signs  ED Triage Vitals  Enc Vitals Group     BP 02/19/19 0859 120/67     Pulse Rate 02/19/19 0859 (!) 109     Resp 02/19/19 0859 20     Temp 02/19/19 0859 98.2 F (36.8 C)     Temp src --      SpO2 02/19/19 0859 100 %     Weight 02/19/19 0901 139 lb 15.9 oz (63.5 kg)     Height 02/19/19 0901 5\' 9"  (1.753 m)     Head Circumference --      Peak Flow --      Pain Score 02/19/19 0901 10     Pain Loc --      Pain Edu? --      Excl. in [place]? --     Physical Exam Vitals signs and nursing note reviewed.  Constitutional:      General: He is not in acute distress.    Appearance: He is well-developed. He is not ill-appearing.  HENT:     Head: Normocephalic and atraumatic.  Eyes:     Extraocular Movements: Extraocular movements intact.     Conjunctiva/sclera: Conjunctivae normal.     Pupils: Pupils are equal, round, and reactive to light.  Neck:     Musculoskeletal: Neck supple.  Cardiovascular:     Rate and Rhythm: Normal rate and regular rhythm.     Heart sounds: Normal heart sounds. No murmur.  Pulmonary:     Effort: Pulmonary effort is normal. No respiratory distress.     Breath sounds: Normal breath sounds.  Abdominal:     General: Bowel sounds are normal. There is distension.     Palpations: Abdomen is soft.     Tenderness: There is generalized abdominal tenderness. There is no right CVA tenderness, left CVA tenderness, guarding or rebound. Negative signs include Murphy's sign and Rovsing's sign.  Skin:    General: Skin is warm and dry.     Capillary Refill: Capillary refill takes less than 2 seconds.  Neurological:     General: No focal deficit present.     Mental Status: He is alert.      ED Treatments / Results  Labs (all labs ordered are listed, but only abnormal results are displayed) Labs Reviewed  CBC WITH DIFFERENTIAL/PLATELET - Abnormal; Notable for the following components:      Result  Value   RBC 3.74 (*)    Hemoglobin 11.1 (*)    HCT 32.2 (*)    RDW 16.6 (*)    Platelets 147 (*)    All other components within normal limits  BASIC METABOLIC PANEL - Abnormal; Notable for the following components:   Potassium 5.7 (*)    BUN 32 (*)    Creatinine, Ser 8.14 (*)    GFR calc non Af Amer 7 (*)    GFR calc Af Amer 8 (*)    Anion gap 17 (*)    All other components within normal limits  HEPATIC FUNCTION PANEL - Abnormal; Notable for the following components:   Bilirubin, Direct 0.3 (*)    All other components within normal limits  GRAM STAIN  BODY FLUID CULTURE  PROTIME-INR  BODY FLUID CELL COUNT WITH DIFFERENTIAL    EKG EKG Interpretation  Date/Time:  Monday February 19 2019 12:16:40 EDT Ventricular Rate:  82 PR Interval:    QRS Duration: 167 QT Interval:  465  QTC Calculation: 544 R Axis:   -168 Text Interpretation:  Atrial fibrillation Consider left ventricular hypertrophy Borderline T abnormalities, lateral leads Prolonged QT interval Confirmed by Lennice Sites [phone]) on 02/19/2019 12:26:45 PM   Radiology No results found.  Procedures .Critical Care Performed by: Lennice Sites, DO Authorized by: Lennice Sites, DO   Critical care provider statement:    Critical care time (minutes):  35   Critical care was necessary to treat or prevent imminent or life-threatening deterioration of the following conditions:  Metabolic crisis   Critical care was time spent personally by me on the following activities:  Blood draw for specimens, development of treatment plan with patient or surrogate, discussions with consultants, ordering and performing treatments and interventions, ordering and review of laboratory studies, ordering and review of radiographic studies, pulse oximetry, examination of patient, re-evaluation of patient's condition, review of old charts and obtaining history from patient or surrogate   I assumed direction of critical care for this patient from  another provider in my specialty: no     (including critical care time)  Medications Ordered in ED Medications  sodium zirconium cyclosilicate (LOKELMA) packet 10 g (has no administration in time range)  lidocaine (XYLOCAINE) 1 % (with pres) injection (has no administration in time range)  calcium gluconate inj 10% (1 g) URGENT USE ONLY! (has no administration in time range)  lidocaine (XYLOCAINE) 1 % (with pres) injection (10 mLs Infiltration Given 02/19/19 1349)     Initial Impression / Assessment and Plan / ED Course  I have reviewed the triage vital signs and the nursing notes.  Pertinent labs & imaging results that were available during my care of the patient were reviewed by me and considered in my medical decision making (see chart for details).     [name] is a 58 year old male who presents to the ED wanting paracentesis for abdominal pain and distention.  Patient with normal vitals.  No fever.  Patient was supposed to be at dialysis today but states that he needs a paracentesis.  Had dialysis last Friday.  Had last paracentesis about 8 days ago.  States he supposed to get them about every 2 weeks.  But feels like it is worse.  Has not had fever.  Has diffuse tenderness on exam and distention of his abdomen.  Low concern for peritonitis.  Does not take any blood thinners.  Last INR was normal.  Will obtain lab work.  Will talk with IR about paracentesis as they are able to do it.  He is supposed to follow-up with him outpatient to discuss a TIPS procedure. It appears that GI has tried to arrange for paracentesis every 2 weeks but patient appears to want them more frequently. Will talk with IR to see if they are able to fit him in for paracentesis today.  Otherwise may need to follow-up another time.  IR has him on the list for paracentesis.  He was actually scheduled to have one at 1:00 it sounds like.  I have placed a standing order for him to have a paracentesis every 2  weeks outpatient as it appeared that his GI doctor wanted him to have.  He already has a standing albumin order for 50 g for every time he has 5 L taken off.  Anticipate discharge to home after paracentesis.  He needs to follow-up with IR outpatient to discuss TIPS procedure and we have discussed that as well.  We will try to provide him with a  number.  Patient came back from paracentesis did not want to stay for dialysis or for paracentesis fluid analysis results.  He did stay for lokelma and calcium.  Nephrology came down to the ED to try to convince him to stay for dialysis as well.  However, patient wants to leave [place].  He understands the risks and benefits about staying.  He understands that he could have a fatal arrhythmia without dialysis.  Nephrology has been able to arrange for a make-up session tomorrow.  Overall have low suspicion that patient has SBP but does not want to stay for fluid analysis results.   This chart was dictated using voice recognition software.  Despite best efforts to proofread,  errors can occur which can change the documentation meaning.   Final Clinical Impressions(s) / ED Diagnoses   Final diagnoses:  Liver failure [facility])  Hyperkalemia    ED Discharge Orders         Ordered    IR Paracentesis     02/19/19 1025           [place], DO 02/19/19 1515

## 2019-02-19 NOTE — ED Notes (Signed)
Pt transported to IR 

## 2019-02-19 NOTE — Procedures (Signed)
PROCEDURE SUMMARY:  Successful US guided paracentesis from left lateral abdomen.  Yielded 2.1 liters of clear yellow fluid.  No immediate complications.  Patient tolerated well.  EBL = trace  Specimen was sent for labs.  Judie Grieve Brinklee Cisse PA-C 02/19/2019 3:39 PM

## 2019-02-19 NOTE — ED Notes (Signed)
Pt left prior to receiving D/C instructions. Dr Ronnald Nian spoke with pt about the importance of getting dialysis as soon as possible. Pt refused to stay for dialysis stating he needed to go to a funeral

## 2019-02-20 ENCOUNTER — Telehealth: Payer: Self-pay | Admitting: Internal Medicine

## 2019-02-20 LAB — PATHOLOGIST SMEAR REVIEW

## 2019-02-20 NOTE — Telephone Encounter (Signed)
Pt returned your call and would like a call back .. °

## 2019-02-20 NOTE — Telephone Encounter (Signed)
I returned patient's call. He states "nobody ever called me." However, he also tells me he never got my voicemail. I have given him the phone number to [facility] liver clinic and asked that he call to schedule appointment. He indicates that he will do this.

## 2019-02-20 NOTE — Telephone Encounter (Signed)
Per Jasmine Awe at [facility], they reached out to patient but were unable to get him. They left a voicemail for him to call back and schedule an appointment but got no return call.  I have called patient to speak with him regarding this. Unfortunately, I have gotten no answer and have gotten a voicemail. I have advised on voicemail that [facility] liver clinic has reached out to him on 02/16/19 and is awaiting a return call from him to schedule. I have also given him [facility] Liver clinic number so he can contact them for appointment.

## 2019-02-20 NOTE — Telephone Encounter (Signed)
Pt was at the ED yesterday for liver failure and abd pain. He states that he is feeling bad, he feels like his liver is swollen. He would like some advise, Pls call him.

## 2019-02-20 NOTE — Telephone Encounter (Signed)
Pt calling to check on the status of his referral to the liver clinic. Looks like referral was faxed 7/31, have you been notified of an appt yet?

## 2019-02-21 ENCOUNTER — Other Ambulatory Visit: Payer: Self-pay | Admitting: Emergency Medicine

## 2019-02-21 DIAGNOSIS — R188 Other ascites: Secondary | ICD-10-CM

## 2019-02-21 DIAGNOSIS — N186 End stage renal disease: Secondary | ICD-10-CM | POA: Diagnosis not present

## 2019-02-21 DIAGNOSIS — Z992 Dependence on renal dialysis: Secondary | ICD-10-CM | POA: Diagnosis not present

## 2019-02-21 DIAGNOSIS — D631 Anemia in chronic kidney disease: Secondary | ICD-10-CM | POA: Diagnosis not present

## 2019-02-21 DIAGNOSIS — D509 Iron deficiency anemia, unspecified: Secondary | ICD-10-CM | POA: Diagnosis not present

## 2019-02-21 DIAGNOSIS — N2581 Secondary hyperparathyroidism of renal origin: Secondary | ICD-10-CM | POA: Diagnosis not present

## 2019-02-22 ENCOUNTER — Ambulatory Visit
Admission: RE | Admit: 2019-02-22 | Discharge: 2019-02-22 | Disposition: A | Discharge status: Home / Self Care | Payer: Medicare Other | Source: Ambulatory Visit | Attending: Emergency Medicine | Admitting: Emergency Medicine

## 2019-02-22 ENCOUNTER — Other Ambulatory Visit: Payer: Self-pay

## 2019-02-22 ENCOUNTER — Encounter: Payer: Self-pay | Admitting: *Deleted

## 2019-02-22 DIAGNOSIS — R188 Other ascites: Secondary | ICD-10-CM

## 2019-02-22 HISTORY — PX: IR RADIOLOGIST EVAL & MGMT: IMG5224

## 2019-02-22 NOTE — Consult Note (Signed)
Chief Complaint: Patient was consulted remotely today (TeleHealth) for recurrent large volume abdominal ascites, possible TIPS at the request of Curatolo,Adam.    Referring Physician(s): Curatolo,Adam  History of Present Illness: [name] is a 58 y.o. male  with a history of decompensated cirrhosis secondary to hepatitis B and C with recurrent ascites and history of SBP, end-stage renal disease on dialysis, history of atrial fibrillation, COPD, history of multiple colon polyps, GERD, prior substance abuse, who was referred for consultation for possible TIPS due to his recurrent need for large-volume paracentesis.   He has required paracentesis x7 since, June 1 typically removing 2-5 L.    He reports that he continues to struggle with recurrent ascites.  This is very frustrating to him and he wants to do something definitive to deal with the ascitic fluid.  He is having intermittent paracentesis which helps.  When the fluid builds up he has abdominal discomfort, early satiety and also more issues with his breathing.  After paracentesis he has less dyspnea on exertion and better appetite.  He wants to have a liver transplant and was referred for evaluation by [facility] liver clinic. He also wants to know if there is a way to deal with the ascites.  He continues with dialysis but when the ascites is severe the dialysis makes him feel "really bad" and at times he is unable to finish his dialysis session.  He also is frustrated that he is losing weight and "wasting away".  He is not drinking alcohol.  He reports he is taking his lactulose twice a day and having 2-3 bowel movements per day.  He is taking Protonix 40 mg a day.  He also reports compliance with Bactrim 1 double strength tablet daily which he takes for SBP prophylaxis.  Dr. Merrilee Jansky with [facility] reviewed the patient's chart.  Per note it was felt that he was not a liver transplant candidate due to issues with adherence to  dialysis regimen.  The patient found this to be very frustrating.  He feels like no one is giving him a chance for transplant.  He states it is very difficult to complete dialysis when it makes you feel so bad.  He reports it is not that he does not want to finish dialysis but that it is simply impossible for him to do so sometimes because of the way he feels.  Past Medical History:  Diagnosis Date   Anemia    Anxiety    Arthritis    left shoulder   Atherosclerosis of aorta (HCC)    Cardiomegaly    Chest pain    DATE UNKNOWN, C/O PERIODICALLY   Cocaine abuse (HCC)    COPD exacerbation (HCC) 08/17/2016   Coronary artery disease    stent 02/22/17   ESRD (end stage renal disease) on dialysis ([place])    "[name]; MWF" (07/04/2017)   GERD (gastroesophageal reflux disease)    DATE UNKNOWN   Hemorrhoids    Hepatitis B, chronic (HCC)    Hepatitis C    History of kidney stones    Hyperkalemia    Hypertension    Kidney failure    Metabolic bone disease    Patient denies   Mitral stenosis    Myocardial infarction [facility])    Pneumonia    Pulmonary edema    Solitary rectal ulcer syndrome 07/2017   at flex sig for rectal bleeding   Tubular adenoma of colon     Past Surgical  History:  Procedure Laterality Date   A/V FISTULAGRAM Left 05/26/2017   Procedure: A/V FISTULAGRAM;  Surgeon: Conrad Mont Belvieu, MD;  Location: [place] [facility];  Service: Cardiovascular;  Laterality: Left;   A/V FISTULAGRAM Right 11/18/2017   Procedure: A/V FISTULAGRAM - Right Arm;  Surgeon: Elam Dutch, MD;  Location: [place] [facility];  Service: Cardiovascular;  Laterality: Right;   APPLICATION OF WOUND VAC Left 06/14/2017   Procedure: APPLICATION OF WOUND VAC;  Surgeon: Katha Cabal, MD;  Location: [facility];  Service: Vascular;  Laterality: Left;   AV FISTULA PLACEMENT  2012   BELIEVED WAS PLACED IN JUNE   AV FISTULA PLACEMENT Right 08/09/2017   Procedure: Creation Right  arm ARTERIOVENOUS BRACHIOCEPOHALIC FISTULA;  Surgeon: Elam Dutch, MD;  Location: [facility] OR;  Service: Vascular;  Laterality: Right;   AV FISTULA PLACEMENT Right 11/22/2017   Procedure: INSERTION OF ARTERIOVENOUS (AV) GORE-TEX GRAFT RIGHT UPPER ARM;  Surgeon: Elam Dutch, MD;  Location: [place];  Service: Vascular;  Laterality: Right;   BIOPSY  01/25/2018   Procedure: BIOPSY;  Surgeon: Jerene Bears, MD;  Location: [place];  Service: Gastroenterology;;   COLONOSCOPY     COLONOSCOPY WITH PROPOFOL N/A 01/25/2018   Procedure: COLONOSCOPY WITH PROPOFOL;  Surgeon: Jerene Bears, MD;  Location: [place];  Service: Gastroenterology;  Laterality: N/A;   CORONARY STENT INTERVENTION N/A 02/22/2017   Procedure: CORONARY STENT INTERVENTION;  Surgeon: Nigel Mormon, MD;  Location: [place] [facility];  Service: Cardiovascular;  Laterality: N/A;   ESOPHAGOGASTRODUODENOSCOPY (EGD) WITH PROPOFOL N/A 01/25/2018   Procedure: ESOPHAGOGASTRODUODENOSCOPY (EGD) WITH PROPOFOL;  Surgeon: Jerene Bears, MD;  Location: [place];  Service: Gastroenterology;  Laterality: N/A;   FLEXIBLE SIGMOIDOSCOPY N/A 07/15/2017   Procedure: FLEXIBLE SIGMOIDOSCOPY;  Surgeon: Carol Ada, MD;  Location: [place];  Service: Endoscopy;  Laterality: N/A;   HEMORRHOID BANDING     I&D EXTREMITY Left 06/01/2017   Procedure: IRRIGATION AND DEBRIDEMENT LEFT ARM HEMATOMA WITH LIGATION OF LEFT ARM AV FISTULA;  Surgeon: Elam Dutch, MD;  Location: [place];  Service: Vascular;  Laterality: Left;   I&D EXTREMITY Left 06/14/2017   Procedure: IRRIGATION AND DEBRIDEMENT EXTREMITY;  Surgeon: Katha Cabal, MD;  Location: [facility];  Service: Vascular;  Laterality: Left;   INSERTION OF DIALYSIS CATHETER  05/30/2017   INSERTION OF DIALYSIS CATHETER N/A 05/30/2017   Procedure: INSERTION OF DIALYSIS CATHETER;  Surgeon: Elam Dutch, MD;  Location: [place];  Service: Vascular;  Laterality: N/A;   IR PARACENTESIS   08/30/2017   IR PARACENTESIS  09/29/2017   IR PARACENTESIS  10/28/2017   IR PARACENTESIS  11/09/2017   IR PARACENTESIS  11/16/2017   IR PARACENTESIS  11/28/2017   IR PARACENTESIS  12/01/2017   IR PARACENTESIS  12/06/2017   IR PARACENTESIS  01/03/2018   IR PARACENTESIS  01/23/2018   IR PARACENTESIS  02/07/2018   IR PARACENTESIS  02/21/2018   IR PARACENTESIS  03/06/2018   IR PARACENTESIS  03/17/2018   IR PARACENTESIS  04/04/2018   IR PARACENTESIS  12/28/2018   IR PARACENTESIS  01/08/2019   IR PARACENTESIS  01/23/2019   IR PARACENTESIS  02/01/2019   IR PARACENTESIS  02/19/2019   IR RADIOLOGIST EVAL & MGMT  02/14/2018   LEFT HEART CATH AND CORONARY ANGIOGRAPHY N/A 02/22/2017   Procedure: LEFT HEART CATH AND CORONARY ANGIOGRAPHY;  Surgeon: Nigel Mormon, MD;  Location: [place] [facility];  Service: Cardiovascular;  Laterality: N/A;   LIGATION OF ARTERIOVENOUS  FISTULA Left 12/14/9933   Procedure: Plication of Left Arm Arteriovenous Fistula;  Surgeon: Elam Dutch, MD;  Location: [facility] OR;  Service: Vascular;  Laterality: Left;   POLYPECTOMY     POLYPECTOMY  01/25/2018   Procedure: POLYPECTOMY;  Surgeon: Jerene Bears, MD;  Location: [place];  Service: Gastroenterology;;   REVISON OF ARTERIOVENOUS FISTULA Left 01/10/7792   Procedure: PLICATION OF DISTAL ANEURYSMAL SEGEMENT OF LEFT UPPER ARM ARTERIOVENOUS FISTULA;  Surgeon: Elam Dutch, MD;  Location: [place];  Service: Vascular;  Laterality: Left;   REVISON OF ARTERIOVENOUS FISTULA Left 03/15/91   Procedure: Plication of Left Upper Arm Fistula ;  Surgeon: Waynetta Sandy, MD;  Location: [place];  Service: Vascular;  Laterality: Left;   SKIN GRAFT SPLIT THICKNESS LEG / FOOT Left    SKIN GRAFT SPLIT THICKNESS LEFT ARM DONOR SITE: LEFT ANTERIOR THIGH   SKIN SPLIT GRAFT Left 07/04/2017   Procedure: SKIN GRAFT SPLIT THICKNESS LEFT ARM DONOR SITE: LEFT ANTERIOR THIGH;  Surgeon: Elam Dutch, MD;  Location: [place];   Service: Vascular;  Laterality: Left;   THROMBECTOMY W/ EMBOLECTOMY Left 06/05/2017   Procedure: EXPLORATION OF LEFT ARM FOR BLEEDING; OVERSEWED PROXIMAL FISTULA;  Surgeon: [place] Mould, MD;  Location: [place];  Service: Vascular;  Laterality: Left;   WOUND EXPLORATION Left 06/03/2017   Procedure: WOUND EXPLORATION WITH WOUND VAC APPLICATION TO LEFT ARM;  Surgeon: [place] Mould, MD;  Location: [place];  Service: Vascular;  Laterality: Left;    Allergies: Morphine and related, Aspirin, Clonidine derivatives, Tramadol, and Tylenol [acetaminophen]  Medications: Prior to Admission medications   Medication Sig Start Date End Date Taking? Authorizing Provider  albuterol (VENTOLIN HFA) 108 (90 Base) MCG/ACT inhaler Inhale 2 puffs into the lungs every 6 (six) hours as needed for wheezing or shortness of breath.    [provider]  hydrALAZINE (APRESOLINE) 100 MG tablet Take 100 mg by mouth daily. 02/13/19   [provider]  hydrOXYzine (ATARAX/VISTARIL) 25 MG tablet Take 25 mg by mouth 2 (two) times daily as needed. 02/14/19   [provider]  lactulose (CHRONULAC) 10 GM/15ML solution TAKE 45 MLS BY MOUTH 2 TIMES DAILY. Patient taking differently: Take 45 g by mouth 2 (two) times daily.  01/30/19   Pyrtle, Lajuan Lines, MD  metoprolol tartrate (LOPRESSOR) 25 MG tablet Take 1 tablet (25 mg total) by mouth 2 (two) times daily. 03/11/18   Geradine Girt, DO  montelukast (SINGULAIR) 10 MG tablet Take 10 mg by mouth daily.    [provider]  ondansetron (ZOFRAN) 8 MG tablet Take 8 mg by mouth every 8 (eight) hours as needed for nausea or vomiting.     [provider]  pantoprazole (PROTONIX) 40 MG tablet Take 1 tablet (40 mg total) by mouth See admin instructions. Take one tablet (40 mg) by mouth in the morning on Sunday, Tuesday, Thursday, Saturday, take one tablet (40 mg) after dialysis on Monday, Wednesday, Friday. May also take one tablet (40 mg) in the  evening as needed for acid reflux. 12/26/18   Esterwood, Amy S, PA-C  sevelamer carbonate (RENVELA) 800 MG tablet Take 4 tablets with meals  And 2 tablets twice daily with snacks Patient taking differently: Take 1,600-3,200 mg by mouth See admin instructions. Take 3200 mg three time a day with meals, and 1600 mg twice daily with snacks 01/22/18   Patrecia Pour, MD  sulfamethoxazole-trimethoprim (BACTRIM  DS) 800-160 MG tablet Take 1 tablet by mouth daily. 12/30/18   Geradine Girt, DO  SYMBICORT 160-4.5 MCG/ACT inhaler Inhale 2 puffs into the lungs 2 (two) times a day. 11/30/18   [provider]  zolpidem (AMBIEN) 5 MG tablet Take 5 mg by mouth at bedtime. 12/08/18   [provider]     Family History  Problem Relation Age of Onset   Heart disease Mother    Lung cancer Mother    Heart disease Father    Malignant hyperthermia Father    COPD Father    Throat cancer Sister    Esophageal cancer Sister    Hypertension Other    COPD Other    Colon cancer Neg Hx    Colon polyps Neg Hx    Rectal cancer Neg Hx    Stomach cancer Neg Hx     Social History   Socioeconomic History   Marital status: Single    Spouse name: Not on file   Number of children: 3   Years of education: 10   Highest education level: Not on file  Occupational History   Occupation: Unemployed  Scientist, product/process development strain: Not on file   Food insecurity    Worry: Not on file    Inability: Not on file   Transportation needs    Medical: Not on file    Non-medical: Not on file  Tobacco Use   Smoking status: Current Every Day Smoker    Packs/day: 0.50    Years: 43.00    Pack years: 21.50    Types: Cigarettes    Start date: 08/13/1973   Smokeless tobacco: Never Used  Substance and Sexual Activity   Alcohol use: Not Currently    Frequency: Never    Comment: quit drinking in 2017   Drug use: Yes    Types: Marijuana, Cocaine    Comment: reports using once every  3 months   Sexual activity: Not on file  Lifestyle   Physical activity    Days per week: Not on file    Minutes per session: Not on file   Stress: Not on file  Relationships   Social connections    Talks on phone: Not on file    Gets together: Not on file    Attends religious service: Not on file    Active member of club or organization: Not on file    Attends meetings of clubs or organizations: Not on file    Relationship status: Not on file  Other Topics Concern   Not on file  Social History Narrative   Lives alone   Caffeine use: Coffee-rare   Soda- daily      [facility] (03/10/17):   Originally from [facility]. Previously worked trimming trees. No pets currently. No bird or mold exposure.     ECOG Status: 2 - Symptomatic, <50% confined to bed  Review of Systems  Review of Systems: A 12 point ROS discussed and pertinent positives are indicated in the HPI above.  All other systems are negative.  Physical Exam No direct physical exam was performed (except for noted visual exam findings with Video Visits).     Vital Signs: There were no vitals taken for this visit.  Imaging: US Paracentesis  Result Date: 02/11/2019 INDICATION: Patient with history of alcoholic cirrhosis with recurrent ascites presented to [facility] today with complaints of abdominal pain. Request for diagnostic and therapeutic paracentesis today in IR. EXAM: ULTRASOUND  GUIDED diagnostic and therapeutic PARACENTESIS MEDICATIONS: 15 mL 1% lidocaine COMPLICATIONS: None immediate. PROCEDURE: Informed written consent was obtained from the patient after a discussion of the risks, benefits and alternatives to treatment. A timeout was performed prior to the initiation of the procedure. Initial ultrasound scanning demonstrates a large amount of ascites within the left lower abdominal quadrant. The left lower abdomen was prepped and draped in the usual sterile fashion. 1% lidocaine was used for local anesthesia.  Following this, a 19 gauge, 7-cm, Yueh catheter was introduced. An ultrasound image was saved for documentation purposes. The paracentesis was performed. The catheter was removed and a dressing was applied. The patient tolerated the procedure well without immediate post procedural complication. FINDINGS: A total of approximately 2.7 L of amber fluid was removed. Samples were sent to the laboratory as requested by the clinical team. IMPRESSION: Successful ultrasound-guided paracentesis yielding 2.7 liters of peritoneal fluid. Read by Candiss Norse, PA-C Electronically Signed   By: Lucrezia Europe M.D.   On: 02/11/2019 15:55   Dg Chest Portable 1 View  Result Date: 02/09/2019 CLINICAL DATA:  Shortness of breath EXAM: PORTABLE CHEST 1 VIEW COMPARISON:  02/01/2019 FINDINGS: Cardiomegaly. Chronic diffuse interstitial opacity. No effusion or pneumothorax. Atherosclerosis. IMPRESSION: Cardiomegaly and vascular congestion that is stable compared to multiple priors. Electronically Signed   By: Monte Fantasia M.D.   On: 02/09/2019 04:56   Dg Chest Port 1 View  Result Date: 02/01/2019 CLINICAL DATA:  fluid in abdomen;sob, hx of COPD, afib, ESRD EXAM: PORTABLE CHEST 1 VIEW COMPARISON:  01/01/2019 FINDINGS: Enlarged cardiac silhouette, stable from the prior study. No mediastinal or hilar masses. Prominent bronchovascular interstitial markings, stable from the prior chest radiographs. No evidence of pneumonia. No pleural effusion or pneumothorax. Skeletal structures are grossly intact. IMPRESSION: 1. No significant change from the prior chest radiographs. 2. Cardiomegaly prominent bronchovascular and interstitial markings, but without convincing pulmonary edema. No evidence of pneumonia. Electronically Signed   By: Lajean Manes M.D.   On: 02/01/2019 12:14   Ir Paracentesis  Result Date: 02/19/2019 INDICATION: Ascites secondary to alcoholic cirrhosis. Request for diagnostic and therapeutic paracentesis. EXAM:  ULTRASOUND GUIDED PARACENTESIS MEDICATIONS: % lidocaine 15 mL COMPLICATIONS: None immediate. PROCEDURE: Informed written consent was obtained from the patient after a discussion of the risks, benefits and alternatives to treatment. A timeout was performed prior to the initiation of the procedure. Initial ultrasound scanning demonstrates a moderate amount of ascites within the left lower abdominal quadrant. The left lower abdomen was prepped and draped in the usual sterile fashion. 1% lidocaine with epinephrine was used for local anesthesia. Following this, a 19 gauge, 7-cm, Yueh catheter was introduced. An ultrasound image was saved for documentation purposes. The paracentesis was performed. The catheter was removed and a dressing was applied. The patient tolerated the procedure well without immediate post procedural complication. FINDINGS: A total of approximately 2.1 L of clear yellow fluid was removed. Samples were sent to the laboratory as requested by the clinical team. IMPRESSION: Successful ultrasound-guided paracentesis yielding 2.1 liters of peritoneal fluid. Read by: Gareth Eagle, PA-C Electronically Signed   By: Markus Daft M.D.   On: 02/19/2019 15:38   Ir Paracentesis  Result Date: 02/01/2019 INDICATION: Recurrent ascites EXAM: ULTRASOUND-GUIDED PARACENTESIS COMPARISON:  Previous paracentesis. MEDICATIONS: 10 cc 1% lidocaine COMPLICATIONS: None immediate. TECHNIQUE: Informed written consent was obtained from the patient after a discussion of the risks, benefits and alternatives to treatment. A timeout was performed prior to the initiation of the procedure. Initial  ultrasound scanning demonstrates a moderate amount of ascites within the left lower abdominal quadrant. The left lower abdomen was prepped and draped in the usual sterile fashion. 1% lidocaine with epinephrine was used for local anesthesia. Under direct ultrasound guidance, a 19 gauge, 7-cm, Yueh catheter was introduced. An ultrasound image  was saved for documentation purposed. The paracentesis was performed. The catheter was removed and a dressing was applied. The patient tolerated the procedure well without immediate post procedural complication. FINDINGS: A total of approximately 2.2 liters of yellow fluid was removed. IMPRESSION: Successful ultrasound-guided paracentesis yielding 2.2 liters of peritoneal fluid. Read by Lavonia Drafts [facility] Electronically Signed   By: Sandi Mariscal M.D.   On: 02/01/2019 13:53   CT ABDOMEN AND PELVIS WITH CONTRAST  TECHNIQUE: Multidetector CT imaging of the abdomen and pelvis was performed using the standard protocol following bolus administration of intravenous contrast.  CONTRAST: 148mL OMNIPAQUE IOHEXOL 300 MG/ML SOLN  COMPARISON: August 03, 2018  FINDINGS: Lower chest: There is atelectatic change in the left base. Visualized lung bases otherwise are clear. There is cardiomegaly. There is a degree of left ventricular hypertrophy. There is calcification in the mitral valve.  Hepatobiliary: The liver has a subtly nodular contour, consistent with cirrhosis. There is generalized decreased in liver attenuation, potentially with underlying hepatic steatosis. No focal liver lesions are evident. The portal and hepatic veins appear patent. Gallbladder is contracted there is no appreciable biliary duct dilatation.  Pancreas: There is no pancreatic mass or inflammatory focus.  Spleen: The spleen is normal in size and contour. No splenic lesions are evident.  Adrenals/Urinary Tract: Adrenals bilaterally appear unremarkable. Native kidneys are atrophic bilaterally. There is extensive vascular calcification in both main renal arteries as well as in peripheral renal arterial branches. There is an approximately 1 x 1 cm cyst arising from the medial upper pole left kidney. There is no hydronephrosis on either side. There is no evident renal or ureteral calculus on either side. Urinary bladder is  virtually empty. There is no appreciable urinary bladder wall thickening given virtually empty state.  Stomach/Bowel: There is no appreciable bowel wall or mesenteric thickening. A clip is noted in the rectum. There is no evident bowel obstruction. There is no free air or portal venous air.  Vascular/Lymphatic: There is extensive aortic and mesenteric arterial vascular calcification. There is extensive generalized pelvic arterial vascular calcification. Penile artery calcification bilaterally noted. No adenopathy is evident in the abdomen or pelvis.  Reproductive: Prostate and seminal vesicles appear normal in size and contour. There is mild prostatic calcification.  Other: There is marked ascites throughout the abdomen and pelvis. No abscess evident in the abdomen or pelvis. There is no periappendiceal region inflammatory change.  Musculoskeletal: Bones are diffusely sclerotic, consistent with secondary hyperparathyroidism, a consequence of chronic renal failure. No destructive bony changes evident. There is no evident intramuscular lesion. No abdominal wall lesions evident.  IMPRESSION: 1. Evidence of hepatic cirrhosis. No focal liver lesions evident.  2. Extensive ascites throughout the abdomen and pelvis.  3. No evident bowel obstruction. No evident abscess in the abdomen or pelvis. No inflammatory foci evident.  4. Atrophic kidneys without hydronephrosis. No renal or ureteral calculi.  5. Marked diffuse arterial vascular calcification.  6. Diffuse bony sclerosis, a consequence of chronic secondary hyperparathyroidism.   Electronically Signed By: Lowella Grip III M.D. On: 12/17/2018 13:58   Labs:  CBC: Recent Labs    02/01/19 1102 02/11/19 1438 02/18/19 1333 02/19/19 1007  WBC 6.5 8.2  7.3 9.3  HGB 10.1* 10.8* 10.8* 11.1*  HCT 28.8* 31.0* 31.2* 32.2*  PLT 128* 123* 137* 147*    COAGS: Recent Labs    03/11/18 0313 12/26/18 1110 02/11/19 1438  02/19/19 1008  INR 1.26 1.2* 1.2 1.1  APTT 32  --   --   --     BMP: Recent Labs    02/09/19 0500 02/11/19 1438 02/18/19 1333 02/19/19 1007  NA 138 133* 139 138  K 4.1 4.6 5.2* 5.7*  CL 96* 97* 99 98  CO2 27 26 26 23   GLUCOSE 100* 89 91 99  BUN 19 22* 25* 32*  CALCIUM 8.8* 8.5* 8.7* 9.0  CREATININE 6.25* 7.27* 6.68* 8.14*  GFRNONAA 9* 8* 8* 7*  GFRAA 11* 9* 10* 8*    LIVER FUNCTION TESTS: Recent Labs    02/01/19 1104 02/11/19 1438 02/18/19 1333 02/19/19 1007  BILITOT 0.8 1.2 1.0 0.8  AST 30 28 31 28   ALT 19 17 16 15   ALKPHOS 116 109 92 105  PROT 6.7 6.8 6.5 6.8  ALBUMIN 3.8 3.8 3.6 3.8    TUMOR MARKERS: Recent Labs    12/26/18 1110  AFPTM 1.6   MELD score 21  Assessment and Plan:  My impression is that this patient has recurrent abdominal ascites probably largely related to his cirrhosis and portal venous hypertension.  Recent CT shows patency of hepatic and portal veins; he is anatomically a candidate for TIPS creation.  His relatively high MELDscore historically would be a contraindication to elective TIPS placement although more recent literature with modern covered stents has extended the coverage to patients with MELD less than 24.  I reviewed with the patient the pathophysiology of cirrhosis, portal venous hypertension, and recurrent abdominal ascites. We discussed the irreversibility of hepatic cirrhosis, curable only by transplantation.  We discussed the concept of TIPS creation to decrease the portosystemic gradient and decrease ascites creation, and that presence of a TIPS is not a contraindication to liver transplantation.  We discussed in detail  the TIPS technique, anticipated benefits, possible risks and complications, and need for anesthesia.  Alternatives for ascites control include recurrent paracentesis, tunneled peritoneal drain catheter, and peritoneo-venous shunting.  We discussed the possibility of hepatic encephalopathy post TIPS, and the  need for adequate lactulose dosing.   We discussed the importance of close coordination with GI and renal services with regards to fluid balance.  We discussed the need for continued ultrasound surveillance after TIPS creation.  Confirm long-term patency.   He seemed to understand, and was eager to proceed.   Accordingly, we can set up for elective TIPS creation to help control his recurrent large volume abdominal ascites.  Thank you for this interesting consult.  I greatly enjoyed meeting [name] and look forward to participating in their care.  A copy of this report was sent to the requesting provider on this date.  Electronically Signed: Rickard Rhymes 02/22/2019, 4:11 PM   I spent a total of  40 Minutes   in remote  clinical consultation, greater than 50% of which was counseling/coordinating care for recurrent abdominal ascites.    Visit type: Audio only (telephone). Audio (no video) only due to patient's lack of internet/smartphone capability. Alternative for in-person consultation at [facility] [address], [address], [place], [place]. This visit type was conducted due to national recommendations for restrictions regarding the COVID-19 Pandemic (e.g. social distancing).  This format is felt to be most appropriate for this patient at this  time.  All issues noted in this document were discussed and addressed.

## 2019-02-23 DIAGNOSIS — D631 Anemia in chronic kidney disease: Secondary | ICD-10-CM | POA: Diagnosis not present

## 2019-02-23 DIAGNOSIS — D509 Iron deficiency anemia, unspecified: Secondary | ICD-10-CM | POA: Diagnosis not present

## 2019-02-23 DIAGNOSIS — N186 End stage renal disease: Secondary | ICD-10-CM | POA: Diagnosis not present

## 2019-02-23 DIAGNOSIS — N2581 Secondary hyperparathyroidism of renal origin: Secondary | ICD-10-CM | POA: Diagnosis not present

## 2019-02-23 DIAGNOSIS — Z992 Dependence on renal dialysis: Secondary | ICD-10-CM | POA: Diagnosis not present

## 2019-02-24 LAB — CULTURE, BODY FLUID W GRAM STAIN -BOTTLE: Culture: NO GROWTH

## 2019-02-26 DIAGNOSIS — D509 Iron deficiency anemia, unspecified: Secondary | ICD-10-CM | POA: Diagnosis not present

## 2019-02-26 DIAGNOSIS — Z992 Dependence on renal dialysis: Secondary | ICD-10-CM | POA: Diagnosis not present

## 2019-02-26 DIAGNOSIS — N2581 Secondary hyperparathyroidism of renal origin: Secondary | ICD-10-CM | POA: Diagnosis not present

## 2019-02-26 DIAGNOSIS — D631 Anemia in chronic kidney disease: Secondary | ICD-10-CM | POA: Diagnosis not present

## 2019-02-26 DIAGNOSIS — N186 End stage renal disease: Secondary | ICD-10-CM | POA: Diagnosis not present

## 2019-02-27 DIAGNOSIS — R188 Other ascites: Secondary | ICD-10-CM | POA: Diagnosis not present

## 2019-02-27 DIAGNOSIS — K7469 Other cirrhosis of liver: Secondary | ICD-10-CM | POA: Diagnosis not present

## 2019-02-28 ENCOUNTER — Telehealth: Payer: Self-pay | Admitting: Adult Health Nurse Practitioner

## 2019-02-28 DIAGNOSIS — N186 End stage renal disease: Secondary | ICD-10-CM | POA: Diagnosis not present

## 2019-02-28 DIAGNOSIS — D509 Iron deficiency anemia, unspecified: Secondary | ICD-10-CM | POA: Diagnosis not present

## 2019-02-28 DIAGNOSIS — N2581 Secondary hyperparathyroidism of renal origin: Secondary | ICD-10-CM | POA: Diagnosis not present

## 2019-02-28 DIAGNOSIS — D631 Anemia in chronic kidney disease: Secondary | ICD-10-CM | POA: Diagnosis not present

## 2019-02-28 DIAGNOSIS — Z992 Dependence on renal dialysis: Secondary | ICD-10-CM | POA: Diagnosis not present

## 2019-02-28 IMAGING — DX DG ABDOMEN ACUTE W/ 1V CHEST
3 series · 3 of 3 positions shown · non-contrast
Comparison: 08/20/2017

CLINICAL DATA: Chest pain and shortness of breath following
dialysis

EXAM:
DG ABDOMEN ACUTE W/ 1V CHEST

[abdomen erect]
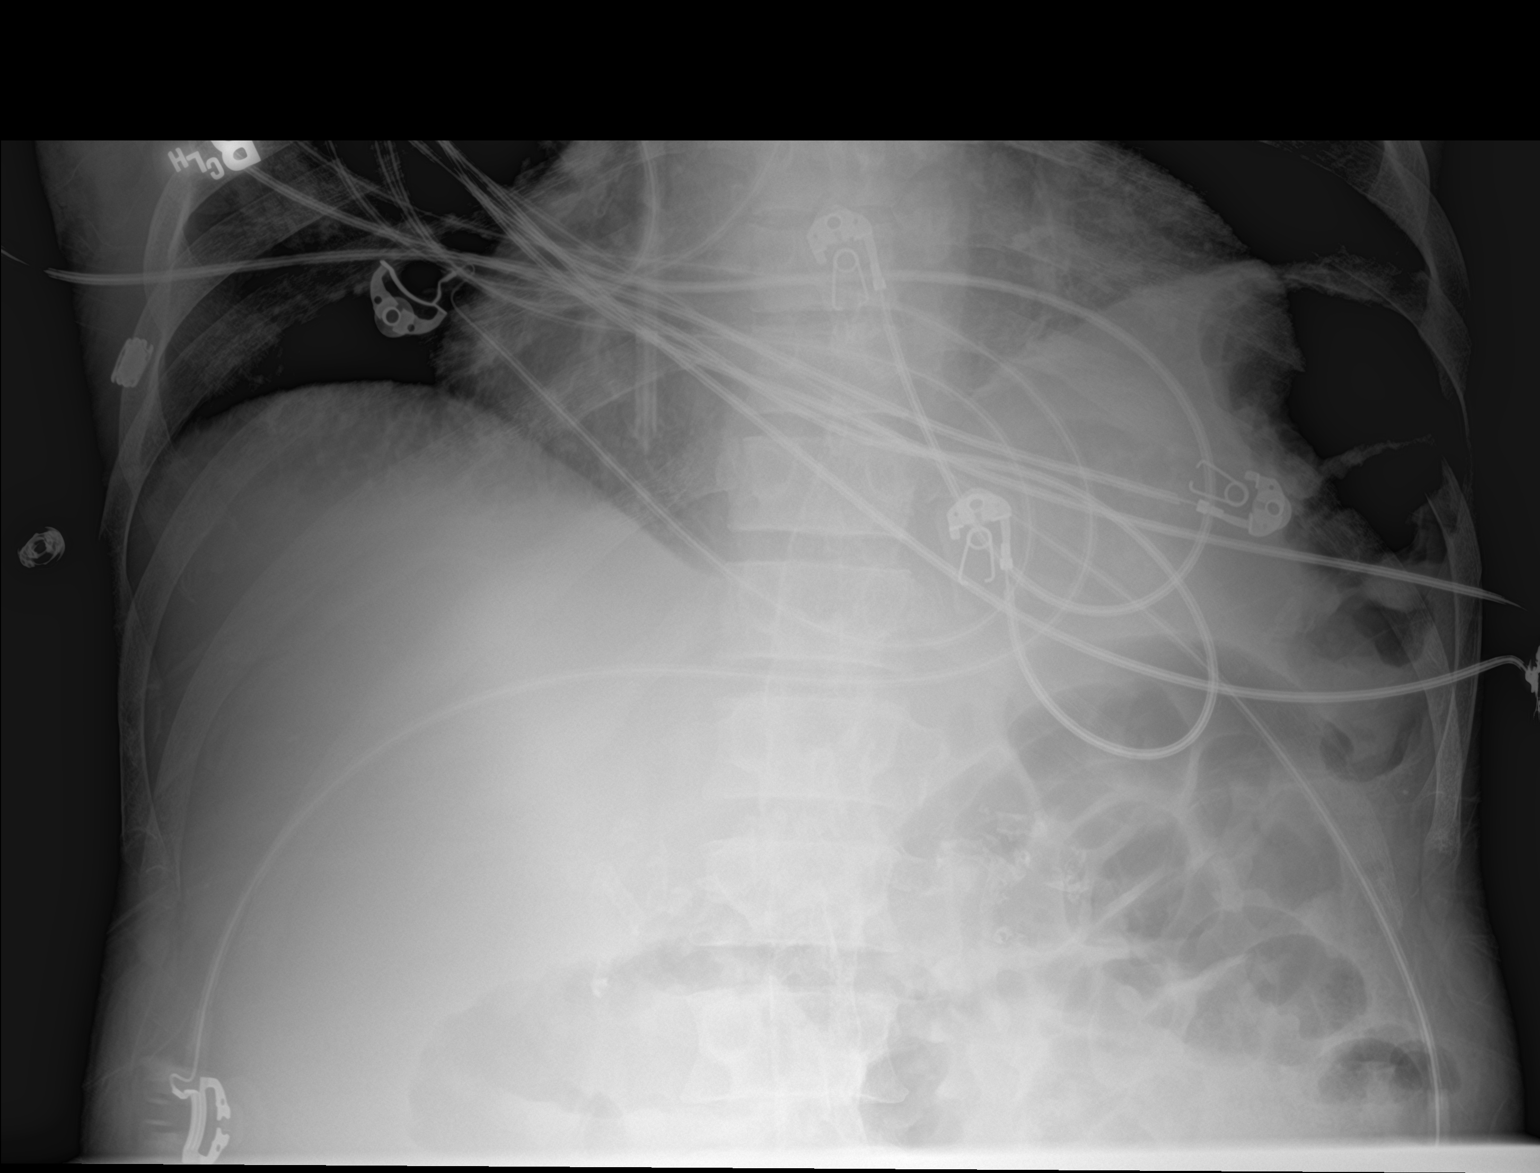

[abdomen supine]
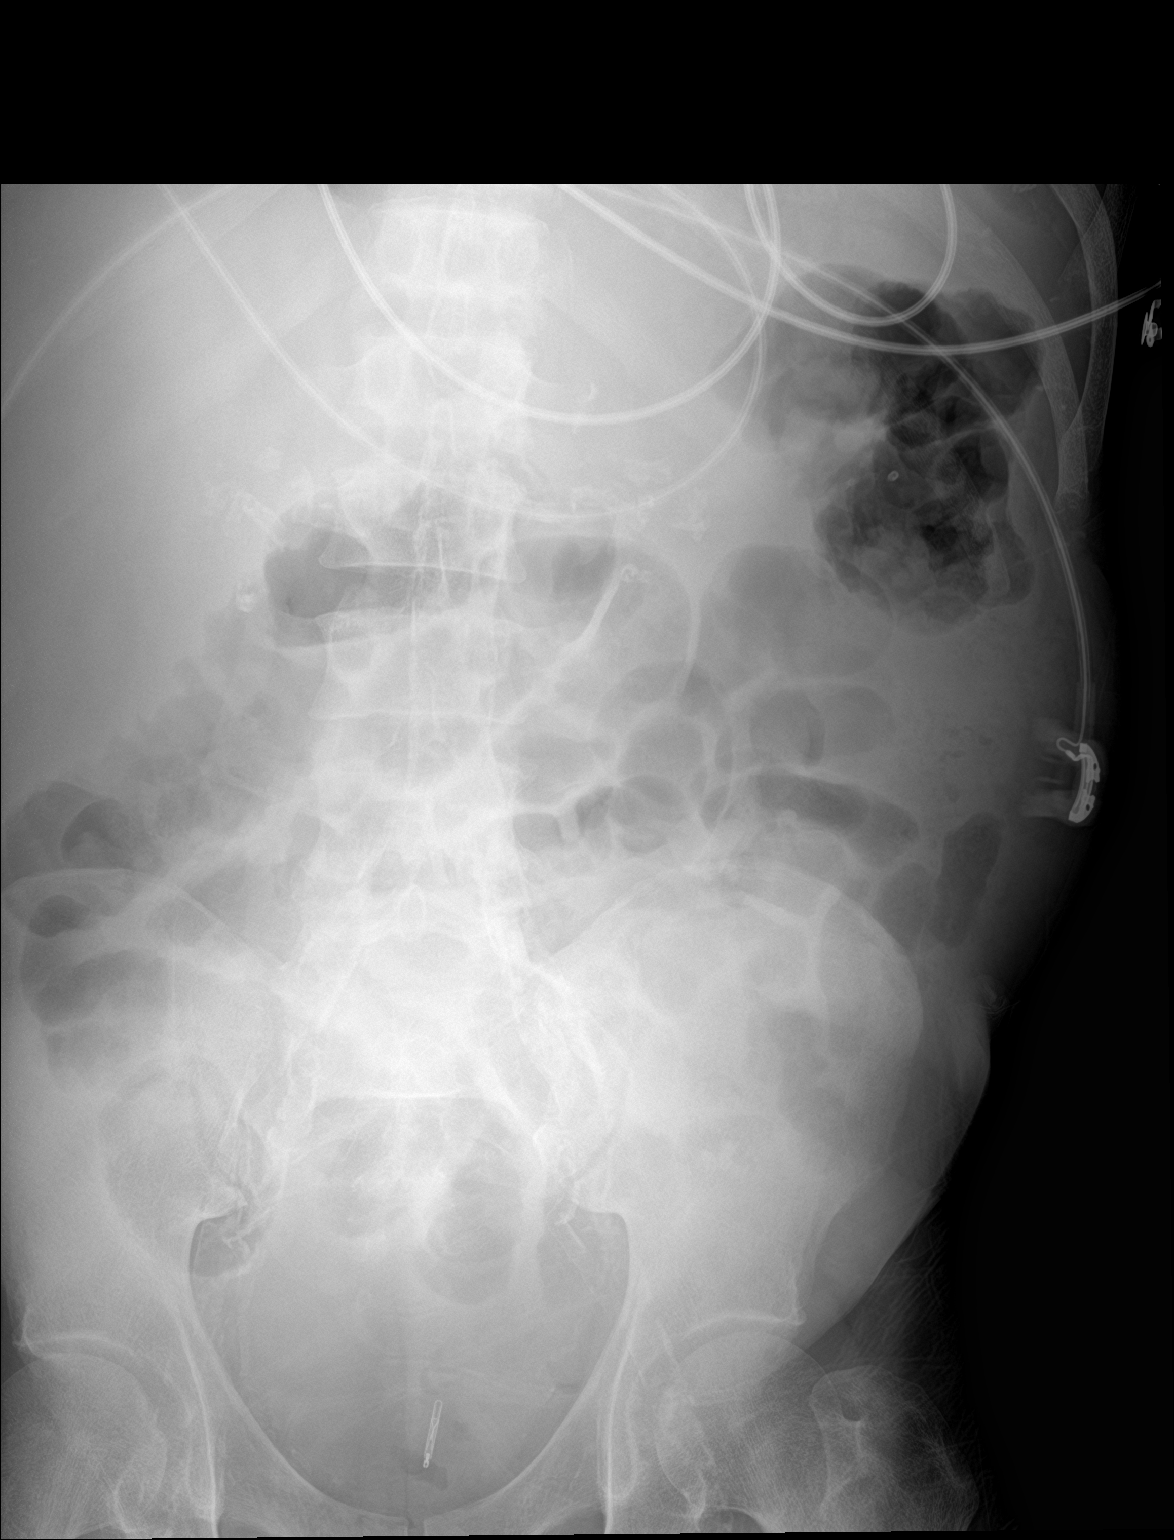

[chest ap]
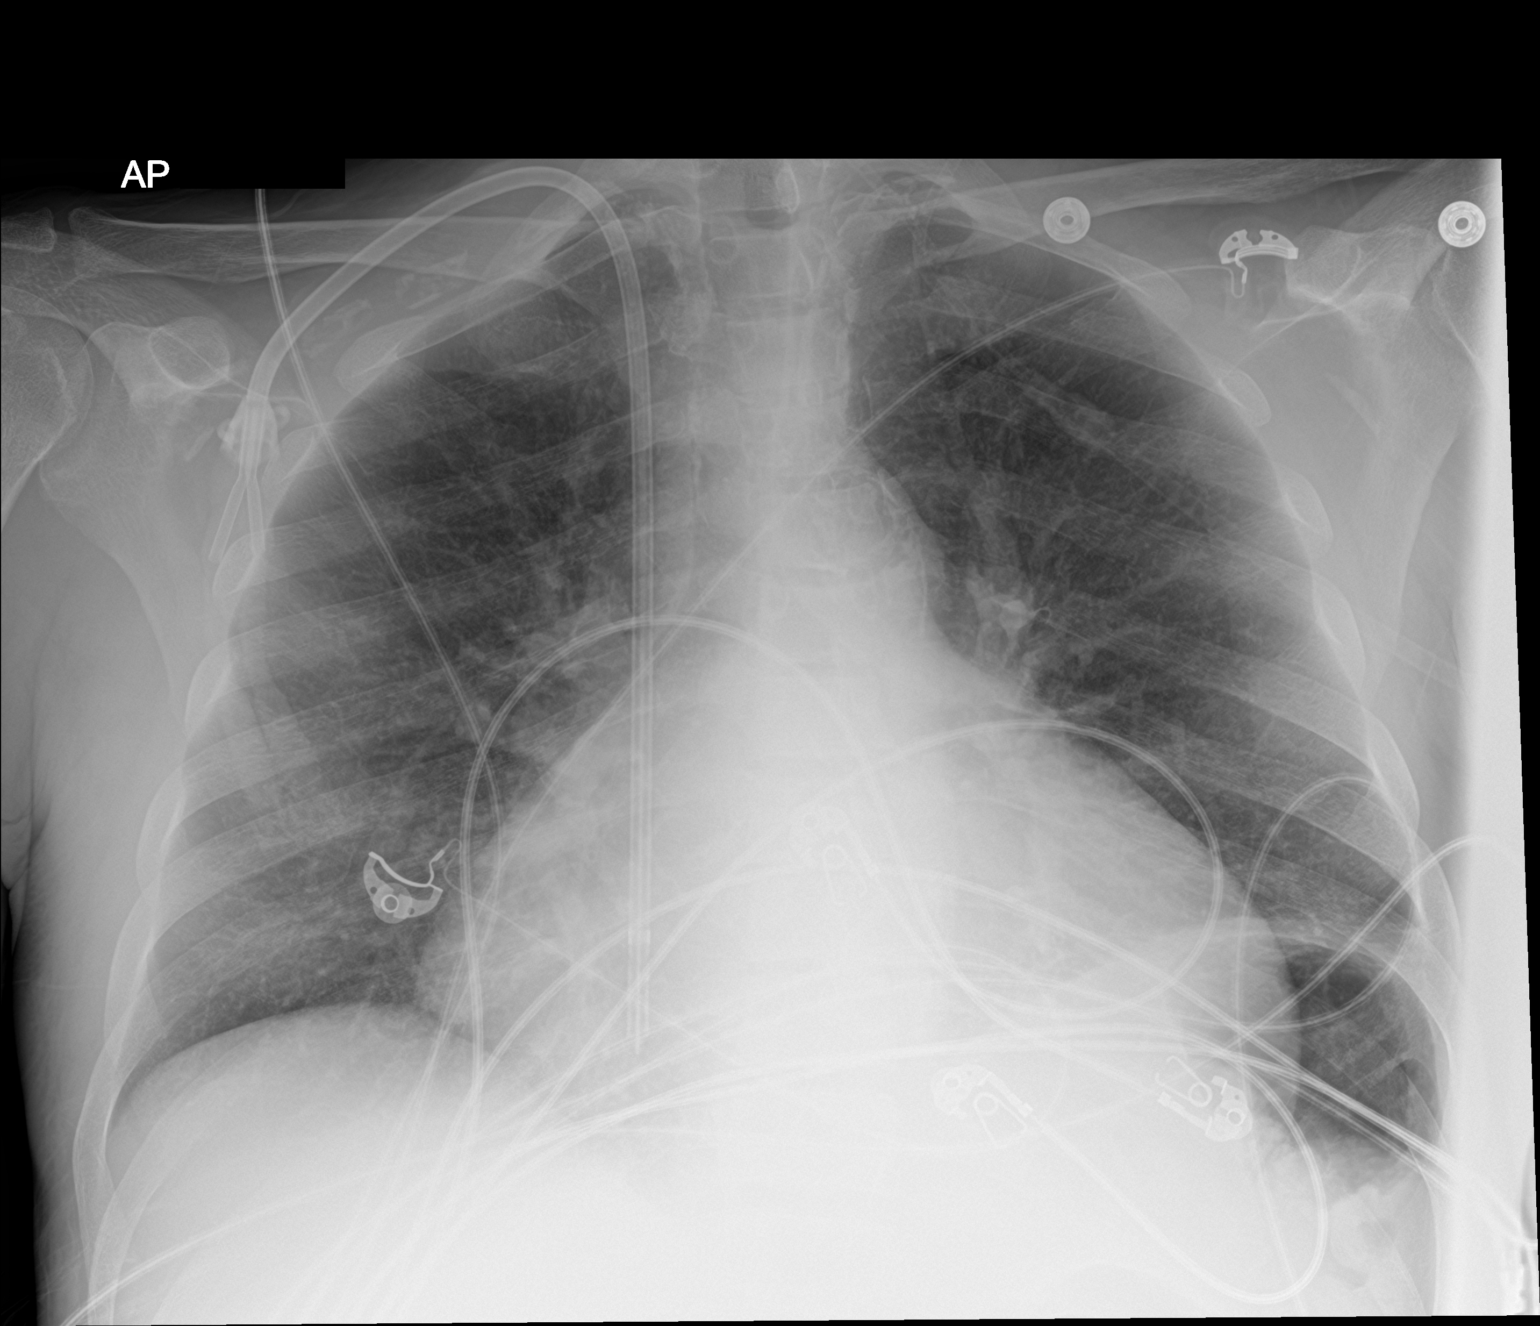

[3 of 3 positions shown; findings below may reference images not displayed]

FINDINGS: Cardiac shadow remains enlarged. Dialysis catheter is again seen and
stable. Aortic calcifications are seen. The lungs are well aerated
bilaterally. No focal infiltrate or effusion is seen. Stable
vascular prominence is noted likely related to a degree of volume
overload.

Scattered large and small bowel gas is noted. No obstructive changes
are seen. Endoscopic clip is noted over the lower pelvis. Diffuse
vascular calcifications are seen. No free air is noted.
IMPRESSION: No acute abnormality noted.

## 2019-02-28 NOTE — Telephone Encounter (Signed)
Called patient to schedule a Palliative f/u visit, no answer.  Left message with reason for call along with my name and contact information.

## 2019-03-01 ENCOUNTER — Other Ambulatory Visit: Payer: Self-pay

## 2019-03-01 ENCOUNTER — Encounter (HOSPITAL_COMMUNITY): Payer: Self-pay | Admitting: Radiology

## 2019-03-01 ENCOUNTER — Ambulatory Visit (HOSPITAL_COMMUNITY)
Admission: RE | Admit: 2019-03-01 | Discharge: 2019-03-01 | Disposition: A | Discharge status: Home / Self Care | Payer: Medicare Other | Source: Ambulatory Visit | Attending: Internal Medicine | Admitting: Internal Medicine

## 2019-03-01 DIAGNOSIS — I480 Paroxysmal atrial fibrillation: Secondary | ICD-10-CM | POA: Insufficient documentation

## 2019-03-01 DIAGNOSIS — Z992 Dependence on renal dialysis: Secondary | ICD-10-CM | POA: Insufficient documentation

## 2019-03-01 DIAGNOSIS — D631 Anemia in chronic kidney disease: Secondary | ICD-10-CM

## 2019-03-01 DIAGNOSIS — I120 Hypertensive chronic kidney disease with stage 5 chronic kidney disease or end stage renal disease: Secondary | ICD-10-CM | POA: Diagnosis not present

## 2019-03-01 DIAGNOSIS — N186 End stage renal disease: Secondary | ICD-10-CM | POA: Insufficient documentation

## 2019-03-01 DIAGNOSIS — I10 Essential (primary) hypertension: Secondary | ICD-10-CM

## 2019-03-01 DIAGNOSIS — J42 Unspecified chronic bronchitis: Secondary | ICD-10-CM | POA: Diagnosis not present

## 2019-03-01 DIAGNOSIS — B181 Chronic viral hepatitis B without delta-agent: Secondary | ICD-10-CM | POA: Diagnosis not present

## 2019-03-01 DIAGNOSIS — B182 Chronic viral hepatitis C: Secondary | ICD-10-CM

## 2019-03-01 DIAGNOSIS — R188 Other ascites: Secondary | ICD-10-CM | POA: Insufficient documentation

## 2019-03-01 HISTORY — PX: IR PARACENTESIS: IMG2679

## 2019-03-01 MED ORDER — LIDOCAINE HCL 1 % IJ SOLN
INTRAMUSCULAR | Status: AC
  Filled 2019-03-01: qty 20

## 2019-03-01 MED ORDER — LIDOCAINE HCL (PF) 1 % IJ SOLN
INTRAMUSCULAR | Status: DC | PRN
  Administered 2019-03-01: 10 mL

## 2019-03-01 NOTE — Procedures (Signed)
   US guided RLQ paracentesis  4L light amber fluid  Tolerated well

## 2019-03-02 DIAGNOSIS — Z992 Dependence on renal dialysis: Secondary | ICD-10-CM | POA: Diagnosis not present

## 2019-03-02 DIAGNOSIS — N186 End stage renal disease: Secondary | ICD-10-CM | POA: Diagnosis not present

## 2019-03-02 DIAGNOSIS — D631 Anemia in chronic kidney disease: Secondary | ICD-10-CM | POA: Diagnosis not present

## 2019-03-02 DIAGNOSIS — N2581 Secondary hyperparathyroidism of renal origin: Secondary | ICD-10-CM | POA: Diagnosis not present

## 2019-03-02 DIAGNOSIS — D509 Iron deficiency anemia, unspecified: Secondary | ICD-10-CM | POA: Diagnosis not present

## 2019-03-04 IMAGING — DX DG CHEST 2V
2 series · 2 of 2 positions shown · non-contrast
Comparison: August 29, 2017

CLINICAL DATA: Shortness of breath. Renal failure, receiving
dialysis.

EXAM:
CHEST  2 VIEW

[chest lat]
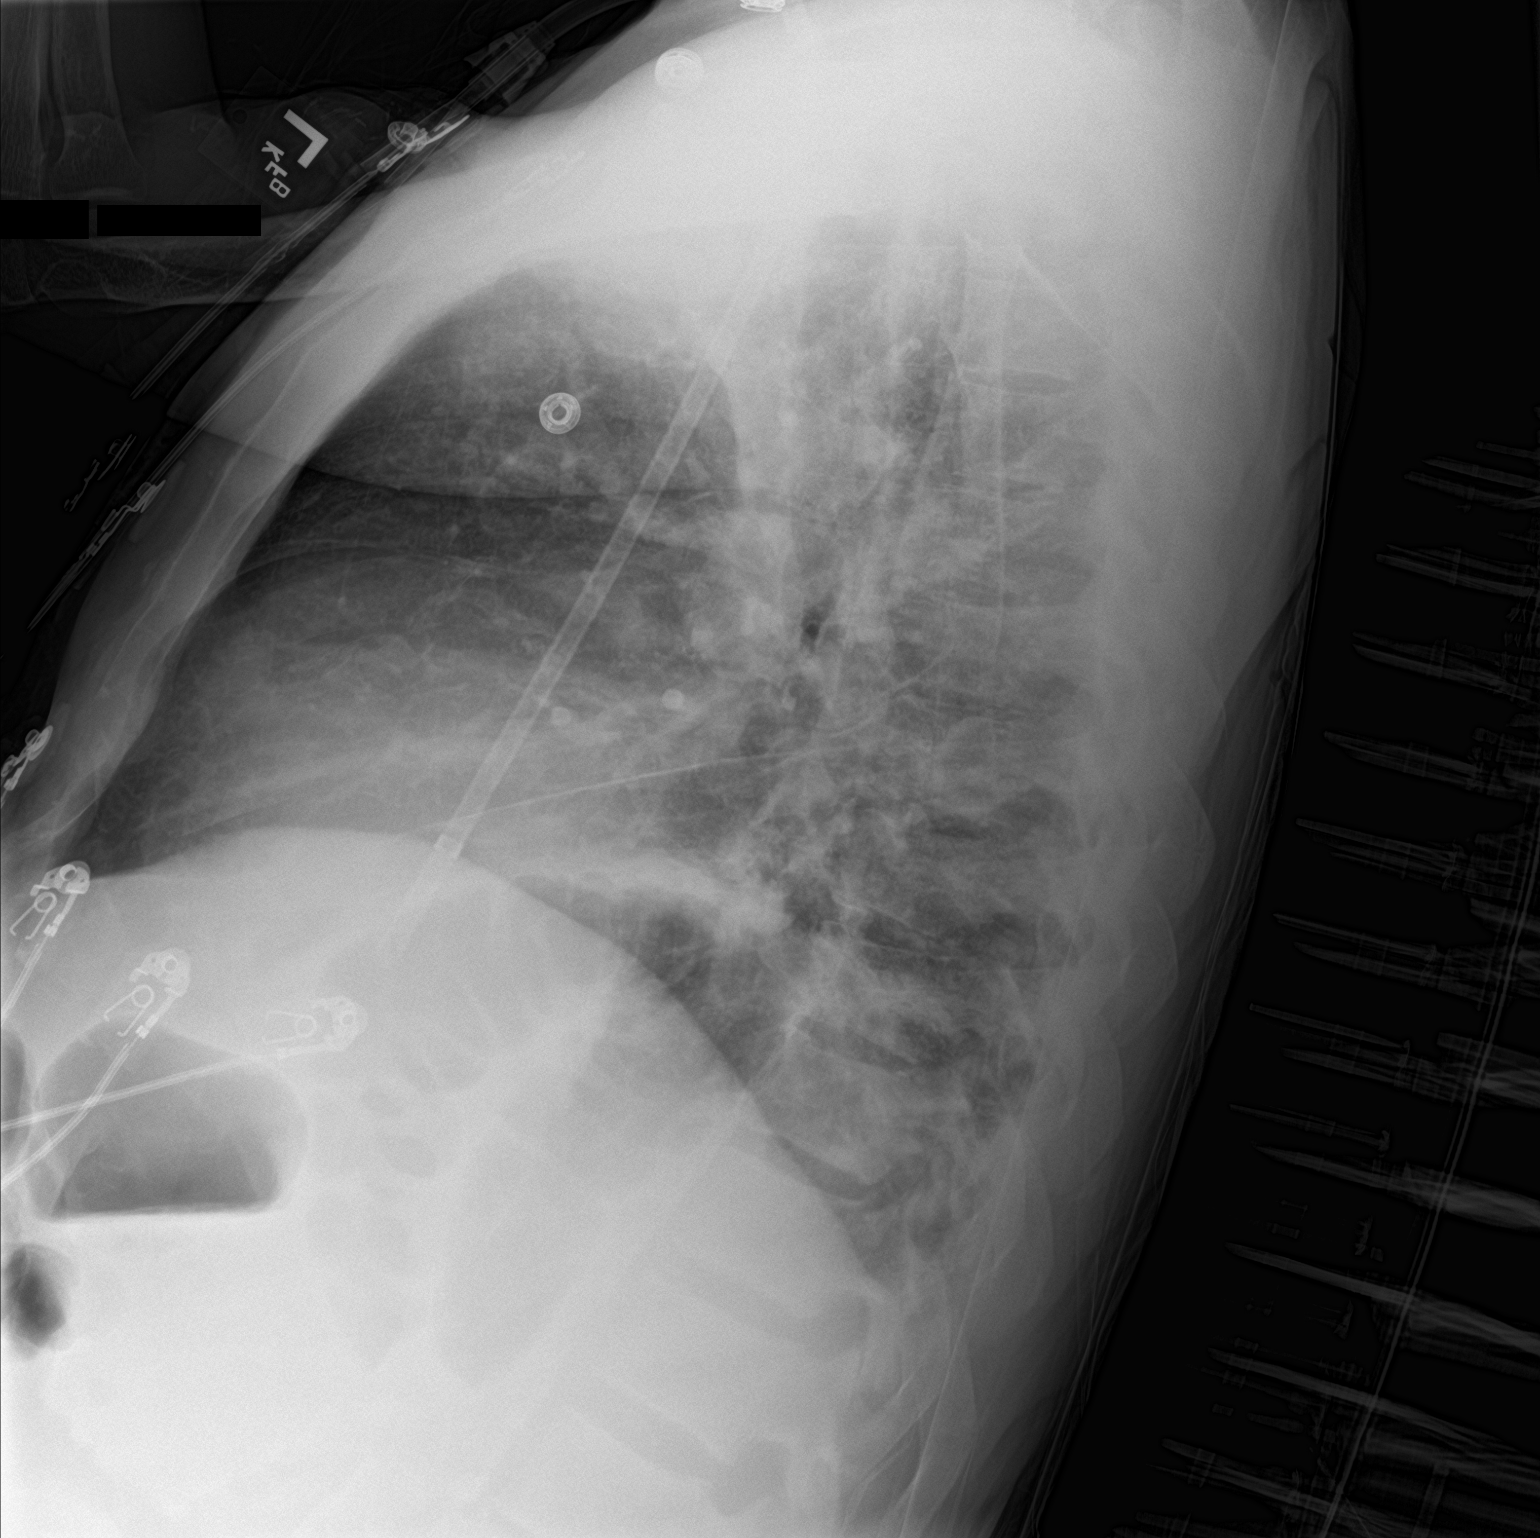

[chest ap]
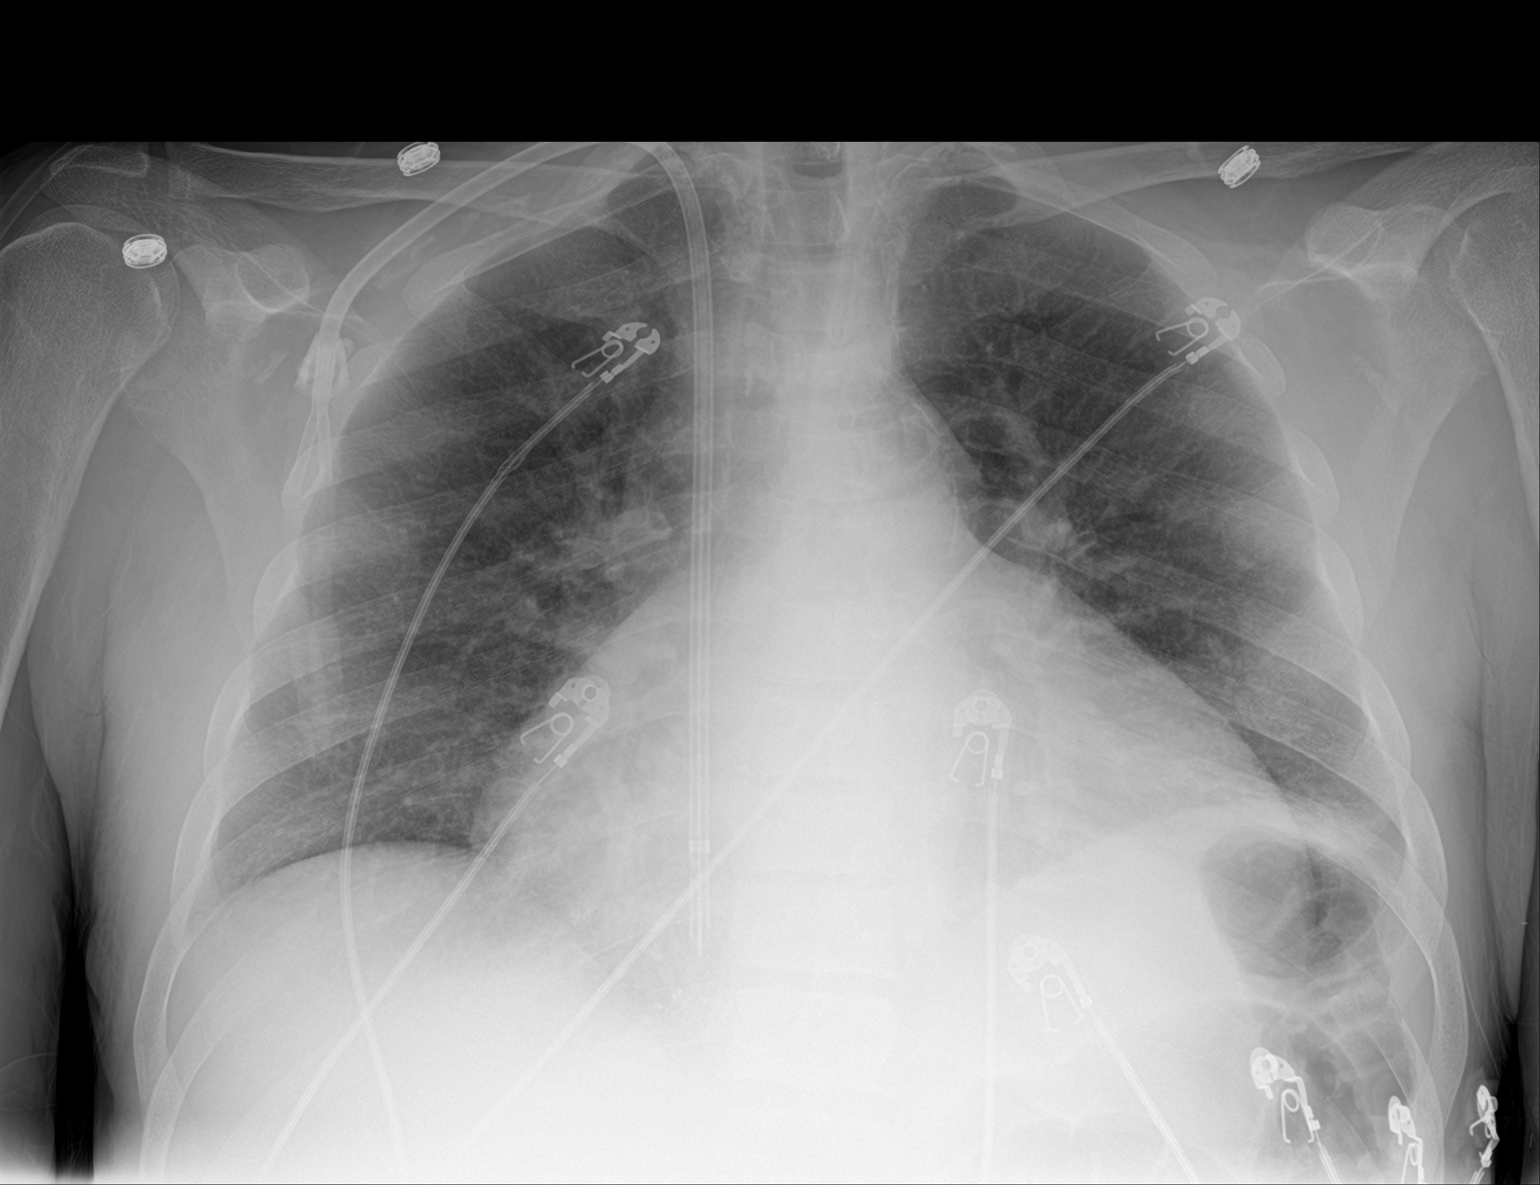

[2 of 2 positions shown; findings below may reference images not displayed]

FINDINGS: Central catheter tip is in the right atrium. No pneumothorax. There
is no edema or consolidation. There is generalized cardiomegaly with
pulmonary vascularity normal. No adenopathy. There is aortic
atherosclerosis. No evident bone lesions.
IMPRESSION: Cardiomegaly. Superimposed pericardial effusion cannot be excluded.
No edema or consolidation. Pulmonary vascularity normal. Aortic
atherosclerosis.

Central catheter tip in right atrium.  No pneumothorax.

Aortic Atherosclerosis ([id number]).

## 2019-03-04 IMAGING — CT CT ABD-PELV W/ CM
2 of 5 series · 16 of 46 positions shown, 18 images · IV contrast (Omni 300)
Comparison: 07/15/2017

CLINICAL DATA: Abdominal pain for 3 days.

EXAM:
CT ABDOMEN AND PELVIS WITH CONTRAST
TECHNIQUE: Multidetector CT imaging of the abdomen and pelvis was performed
using the standard protocol following bolus administration of
intravenous contrast.
CONTRAST:  100mL [id number] IOPAMIDOL ([id number]-2CC) INJECTION 61%

[Series 3: a/p w/ 5mm · axial · 0.83mm/px · z∈[+847,+1322]mm · 13 of 107 slices shown, 15 images]
[im 6/107  soft-tissue]
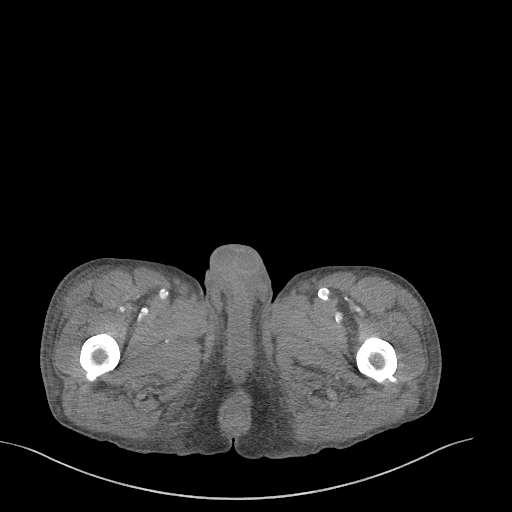
[im 6/107  bone]
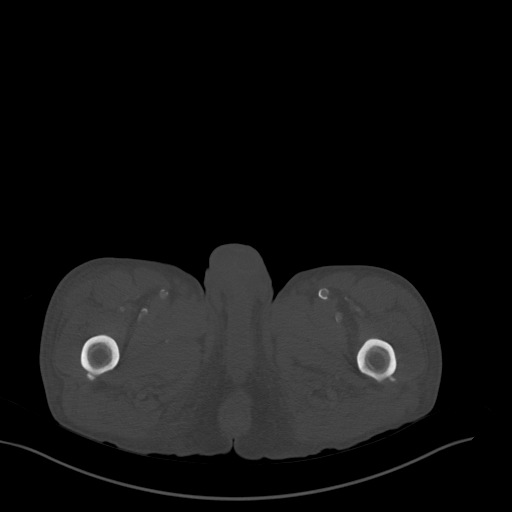
[im 16/107  soft-tissue]
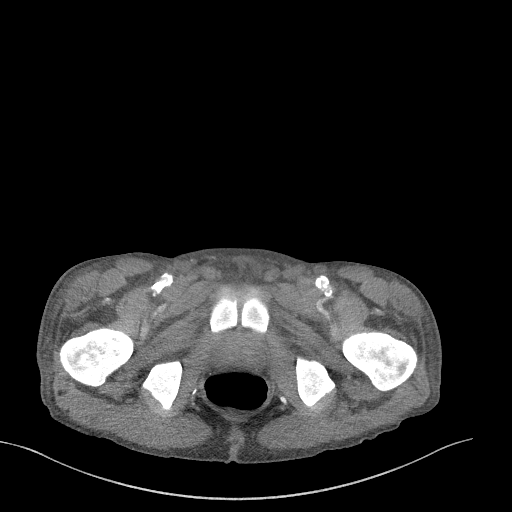
[im 22/107  soft-tissue]
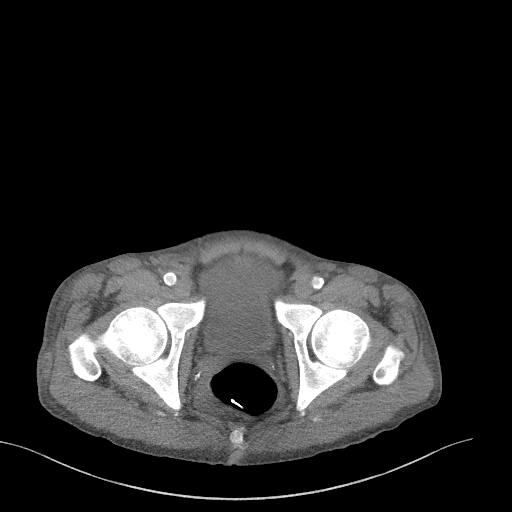
[im 32/107  soft-tissue]
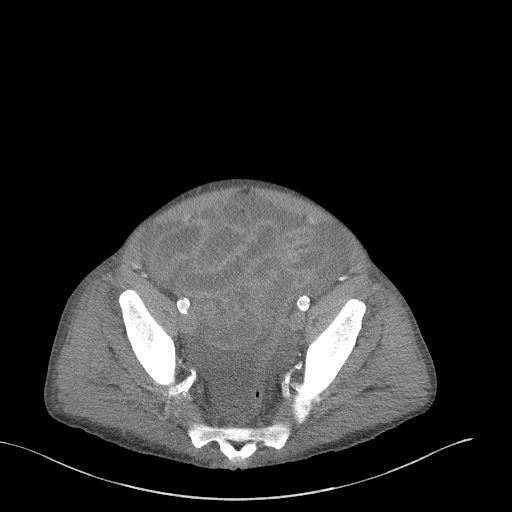
[im 38/107  soft-tissue]
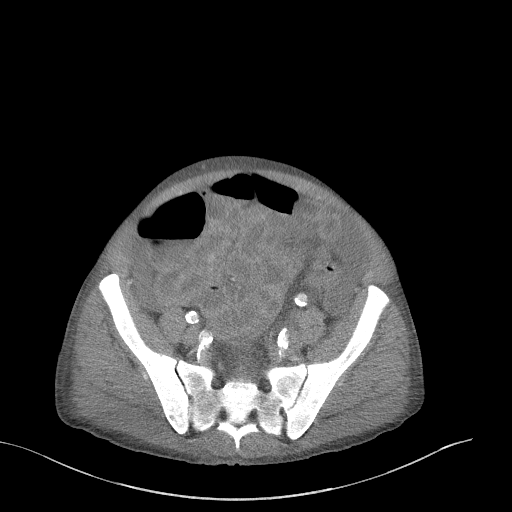
[im 48/107  soft-tissue]
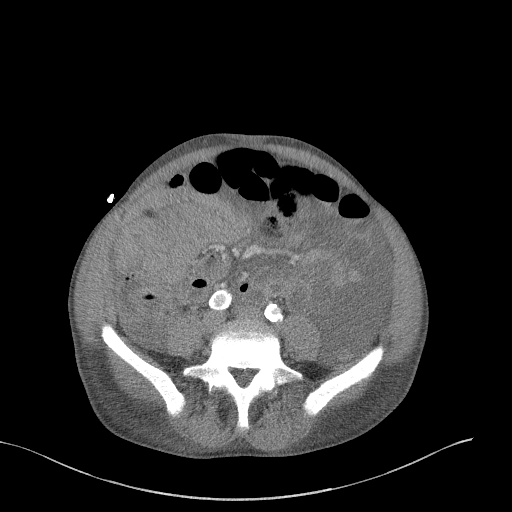
[im 54/107  soft-tissue]
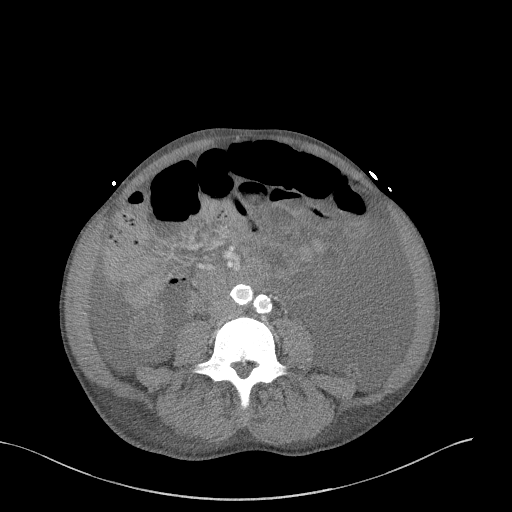
[im 59/107  soft-tissue]
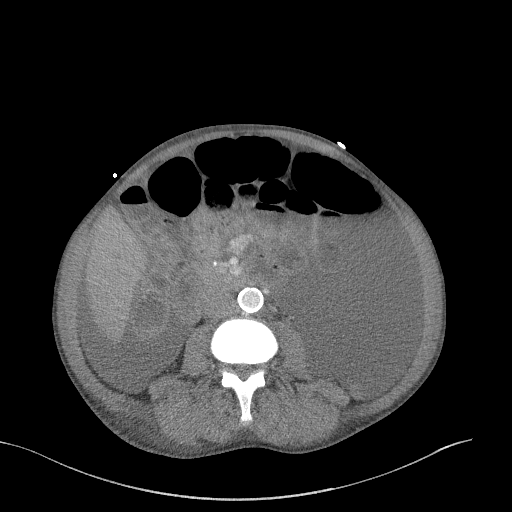
[im 69/107  soft-tissue]
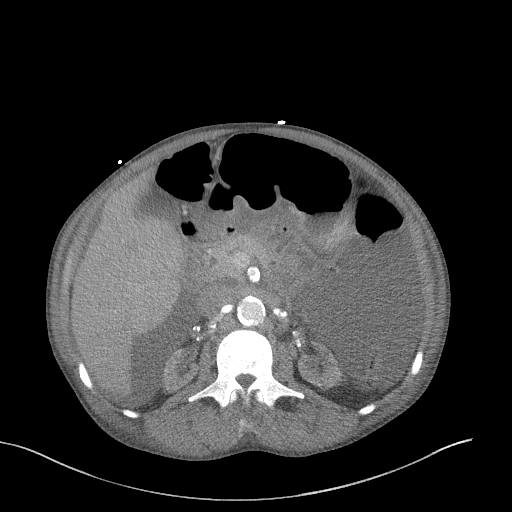
[im 69/107  bone]
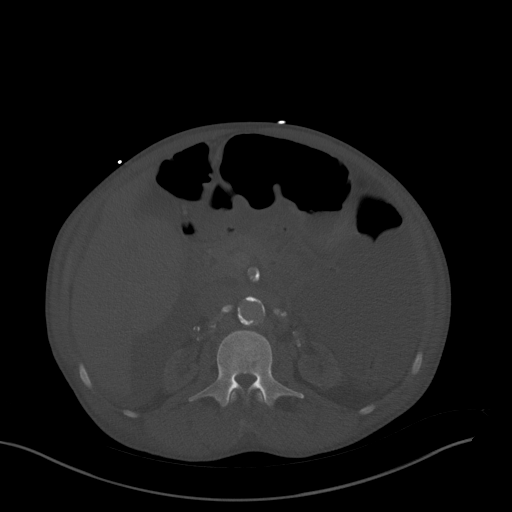
[im 75/107  soft-tissue]
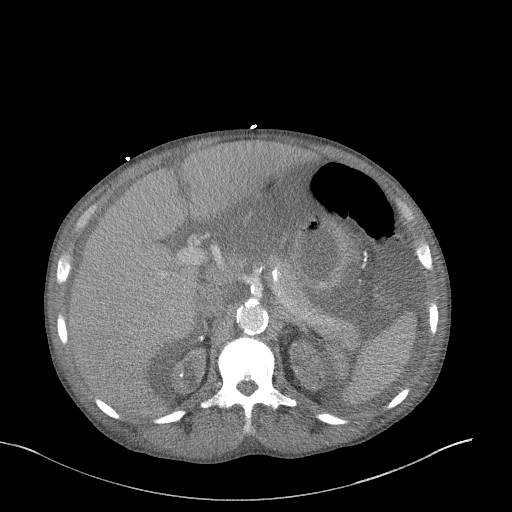
[im 85/107  soft-tissue]
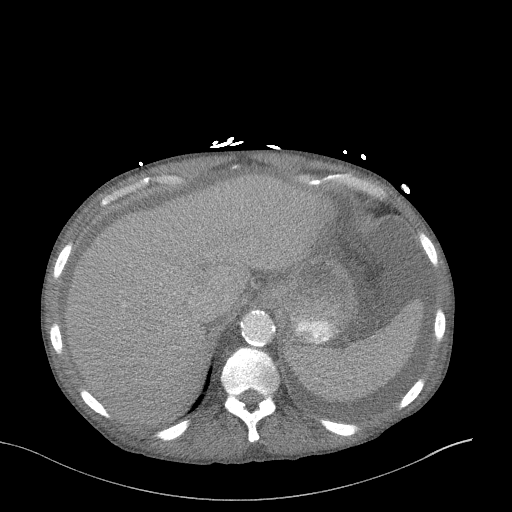
[im 91/107  soft-tissue]
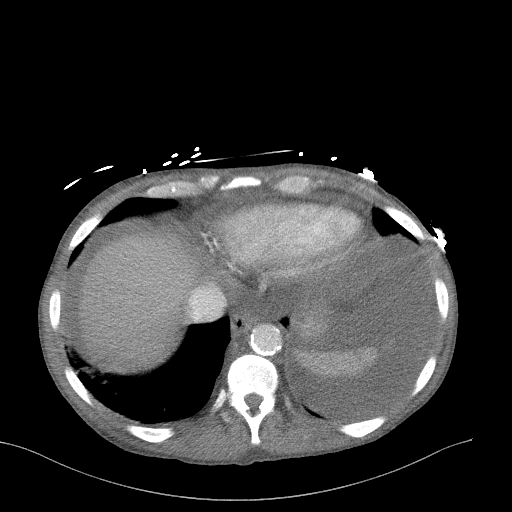
[im 101/107  soft-tissue]
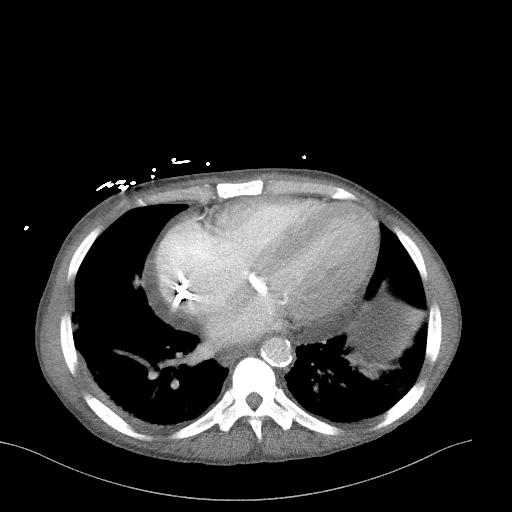

[Series 6: a/p w/ cor · coronal · 0.81mm/px · 3 of 166 slices shown]
[im 56/166  soft-tissue]
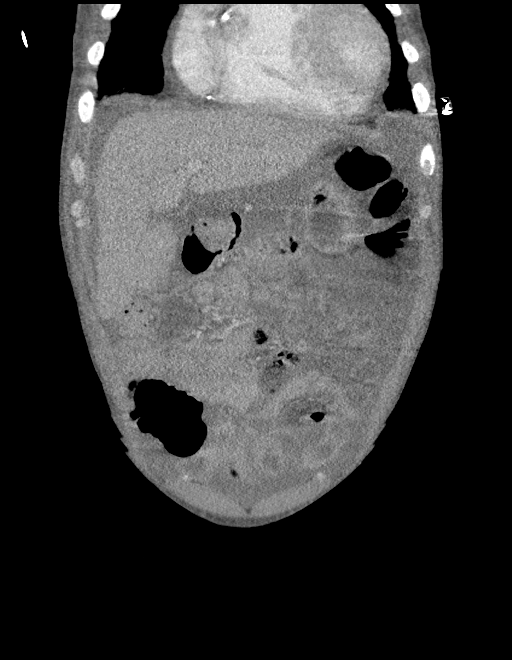
[im 74/166  soft-tissue]
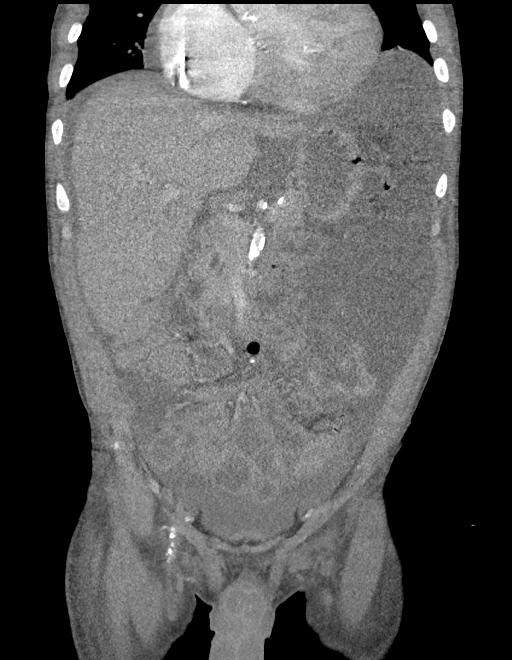
[im 92/166  soft-tissue]
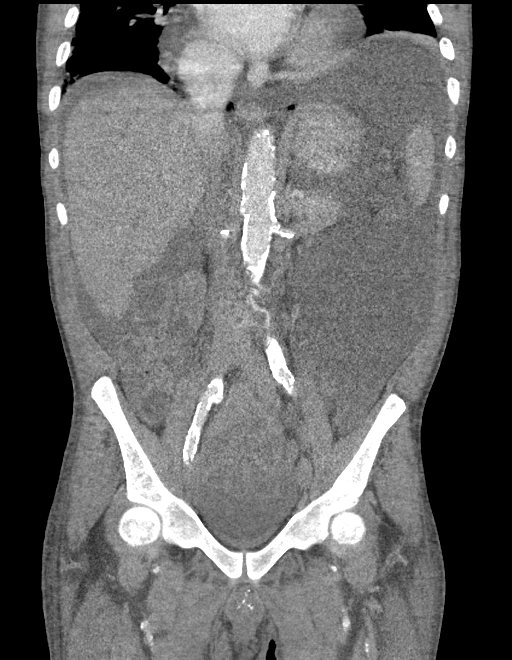

[16 of 46 positions shown; findings below may reference images not displayed]

FINDINGS: Lower chest: New trace pleural effusions bilaterally. Subsegmental
atelectasis in both lung bases. Cardiomegaly and coronary artery
atherosclerosis with similar appearance of a small pericardial
effusion. Dialysis catheter in the right atrium.

Hepatobiliary: No focal liver abnormality is seen. No gallstones or
biliary dilatation.

Pancreas: Unremarkable.

Spleen: Unremarkable.

Adrenals/Urinary Tract: Unremarkable adrenal glands. Similar
appearance of marked bilateral renal atrophy and small bilateral
low-density renal lesions. No excreted contrast on delayed imaging.
No hydronephrosis. Unremarkable bladder.

Stomach/Bowel: The stomach is within normal limits. Fluid-filled
nondilated small bowel loops in the lower abdomen and pelvis. No
evidence of bowel obstruction or gross bowel wall thickening. A
hemostatic clip is present in the rectum.

Vascular/Lymphatic: Extensive atherosclerotic calcification of the
abdominal aorta and its major branch vessels. No abdominal aortic
aneurysm. No enlarged lymph nodes.

Reproductive: Unremarkable prostate.

Other: Large volume ascites, similar to prior. No pneumoperitoneum.
No abdominal wall hernia.

Musculoskeletal: Diffusely increased osseous density likely
reflecting renal osteodystrophy. Focally advanced disc degeneration
at L5-S1.
IMPRESSION: 1. Large volume ascites, similar to prior.
2. New trace bilateral pleural effusions and bibasilar subsegmental
atelectasis.
3. Small pericardial effusion.
4.  Aortic Atherosclerosis ([id number]).

## 2019-03-05 DIAGNOSIS — N2581 Secondary hyperparathyroidism of renal origin: Secondary | ICD-10-CM | POA: Diagnosis not present

## 2019-03-05 DIAGNOSIS — D631 Anemia in chronic kidney disease: Secondary | ICD-10-CM | POA: Diagnosis not present

## 2019-03-05 DIAGNOSIS — N186 End stage renal disease: Secondary | ICD-10-CM | POA: Diagnosis not present

## 2019-03-05 DIAGNOSIS — D509 Iron deficiency anemia, unspecified: Secondary | ICD-10-CM | POA: Diagnosis not present

## 2019-03-05 DIAGNOSIS — Z992 Dependence on renal dialysis: Secondary | ICD-10-CM | POA: Diagnosis not present

## 2019-03-06 ENCOUNTER — Telehealth: Payer: Self-pay | Admitting: Internal Medicine

## 2019-03-06 NOTE — Telephone Encounter (Signed)
Dr. Hilarie Fredrickson please see note below, have you spoken with Liver care MD?

## 2019-03-08 ENCOUNTER — Encounter (HOSPITAL_COMMUNITY): Payer: Self-pay | Admitting: *Deleted

## 2019-03-08 ENCOUNTER — Inpatient Hospital Stay (HOSPITAL_COMMUNITY)
Admission: EM | Admit: 2019-03-08 | Discharge: 2019-03-10 | DRG: 432 | Disposition: A | Discharge status: Home / Self Care | Payer: Medicare Other | Attending: Family Medicine | Admitting: Family Medicine

## 2019-03-08 ENCOUNTER — Telehealth: Payer: Self-pay | Admitting: Adult Health Nurse Practitioner

## 2019-03-08 ENCOUNTER — Other Ambulatory Visit: Payer: Self-pay

## 2019-03-08 ENCOUNTER — Telehealth: Payer: Self-pay | Admitting: Internal Medicine

## 2019-03-08 ENCOUNTER — Emergency Department (HOSPITAL_COMMUNITY): Payer: Medicare Other

## 2019-03-08 DIAGNOSIS — Z66 Do not resuscitate: Secondary | ICD-10-CM | POA: Diagnosis present

## 2019-03-08 DIAGNOSIS — I272 Pulmonary hypertension, unspecified: Secondary | ICD-10-CM | POA: Diagnosis present

## 2019-03-08 DIAGNOSIS — Z992 Dependence on renal dialysis: Secondary | ICD-10-CM

## 2019-03-08 DIAGNOSIS — I447 Left bundle-branch block, unspecified: Secondary | ICD-10-CM | POA: Diagnosis present

## 2019-03-08 DIAGNOSIS — Z20828 Contact with and (suspected) exposure to other viral communicable diseases: Secondary | ICD-10-CM | POA: Diagnosis present

## 2019-03-08 DIAGNOSIS — I2510 Atherosclerotic heart disease of native coronary artery without angina pectoris: Secondary | ICD-10-CM | POA: Diagnosis present

## 2019-03-08 DIAGNOSIS — D631 Anemia in chronic kidney disease: Secondary | ICD-10-CM | POA: Diagnosis not present

## 2019-03-08 DIAGNOSIS — Z808 Family history of malignant neoplasm of other organs or systems: Secondary | ICD-10-CM

## 2019-03-08 DIAGNOSIS — Z9115 Patient's noncompliance with renal dialysis: Secondary | ICD-10-CM

## 2019-03-08 DIAGNOSIS — Z886 Allergy status to analgesic agent status: Secondary | ICD-10-CM

## 2019-03-08 DIAGNOSIS — Z800 Family history of malignant neoplasm of digestive organs: Secondary | ICD-10-CM | POA: Diagnosis not present

## 2019-03-08 DIAGNOSIS — G9341 Metabolic encephalopathy: Secondary | ICD-10-CM | POA: Diagnosis present

## 2019-03-08 DIAGNOSIS — B181 Chronic viral hepatitis B without delta-agent: Secondary | ICD-10-CM | POA: Diagnosis present

## 2019-03-08 DIAGNOSIS — Z825 Family history of asthma and other chronic lower respiratory diseases: Secondary | ICD-10-CM

## 2019-03-08 DIAGNOSIS — L89322 Pressure ulcer of left buttock, stage 2: Secondary | ICD-10-CM | POA: Diagnosis present

## 2019-03-08 DIAGNOSIS — E162 Hypoglycemia, unspecified: Secondary | ICD-10-CM | POA: Diagnosis not present

## 2019-03-08 DIAGNOSIS — B182 Chronic viral hepatitis C: Secondary | ICD-10-CM | POA: Diagnosis present

## 2019-03-08 DIAGNOSIS — J449 Chronic obstructive pulmonary disease, unspecified: Secondary | ICD-10-CM | POA: Diagnosis present

## 2019-03-08 DIAGNOSIS — F17210 Nicotine dependence, cigarettes, uncomplicated: Secondary | ICD-10-CM | POA: Diagnosis present

## 2019-03-08 DIAGNOSIS — E161 Other hypoglycemia: Secondary | ICD-10-CM | POA: Diagnosis not present

## 2019-03-08 DIAGNOSIS — K7031 Alcoholic cirrhosis of liver with ascites: Principal | ICD-10-CM | POA: Diagnosis present

## 2019-03-08 DIAGNOSIS — K7460 Unspecified cirrhosis of liver: Secondary | ICD-10-CM | POA: Diagnosis present

## 2019-03-08 DIAGNOSIS — K219 Gastro-esophageal reflux disease without esophagitis: Secondary | ICD-10-CM | POA: Diagnosis present

## 2019-03-08 DIAGNOSIS — R140 Abdominal distension (gaseous): Secondary | ICD-10-CM | POA: Diagnosis not present

## 2019-03-08 DIAGNOSIS — L8990 Pressure ulcer of unspecified site, unspecified stage: Secondary | ICD-10-CM | POA: Insufficient documentation

## 2019-03-08 DIAGNOSIS — Z7951 Long term (current) use of inhaled steroids: Secondary | ICD-10-CM | POA: Diagnosis not present

## 2019-03-08 DIAGNOSIS — R188 Other ascites: Secondary | ICD-10-CM | POA: Diagnosis not present

## 2019-03-08 DIAGNOSIS — I120 Hypertensive chronic kidney disease with stage 5 chronic kidney disease or end stage renal disease: Secondary | ICD-10-CM | POA: Diagnosis present

## 2019-03-08 DIAGNOSIS — N186 End stage renal disease: Secondary | ICD-10-CM

## 2019-03-08 DIAGNOSIS — Z801 Family history of malignant neoplasm of trachea, bronchus and lung: Secondary | ICD-10-CM

## 2019-03-08 DIAGNOSIS — I252 Old myocardial infarction: Secondary | ICD-10-CM

## 2019-03-08 DIAGNOSIS — E875 Hyperkalemia: Secondary | ICD-10-CM | POA: Diagnosis not present

## 2019-03-08 DIAGNOSIS — I499 Cardiac arrhythmia, unspecified: Secondary | ICD-10-CM | POA: Diagnosis not present

## 2019-03-08 DIAGNOSIS — I050 Rheumatic mitral stenosis: Secondary | ICD-10-CM | POA: Diagnosis present

## 2019-03-08 DIAGNOSIS — K7290 Hepatic failure, unspecified without coma: Secondary | ICD-10-CM | POA: Diagnosis present

## 2019-03-08 DIAGNOSIS — N2581 Secondary hyperparathyroidism of renal origin: Secondary | ICD-10-CM | POA: Diagnosis present

## 2019-03-08 DIAGNOSIS — R000 Tachycardia, unspecified: Secondary | ICD-10-CM | POA: Diagnosis not present

## 2019-03-08 DIAGNOSIS — Z885 Allergy status to narcotic agent status: Secondary | ICD-10-CM

## 2019-03-08 DIAGNOSIS — Z9119 Patient's noncompliance with other medical treatment and regimen: Secondary | ICD-10-CM

## 2019-03-08 DIAGNOSIS — K7210 Chronic hepatic failure without coma: Secondary | ICD-10-CM | POA: Diagnosis present

## 2019-03-08 DIAGNOSIS — Z888 Allergy status to other drugs, medicaments and biological substances status: Secondary | ICD-10-CM

## 2019-03-08 DIAGNOSIS — R8569 Abnormal cytological findings in specimens from other digestive organs and abdominal cavity: Secondary | ICD-10-CM | POA: Diagnosis not present

## 2019-03-08 DIAGNOSIS — K7469 Other cirrhosis of liver: Secondary | ICD-10-CM | POA: Diagnosis present

## 2019-03-08 DIAGNOSIS — Z8249 Family history of ischemic heart disease and other diseases of the circulatory system: Secondary | ICD-10-CM

## 2019-03-08 LAB — CBC
HCT: 31.9 % — ABNORMAL LOW (ref 39.0–52.0)
Hemoglobin: 11.3 g/dL — ABNORMAL LOW (ref 13.0–17.0)
MCH: 29.7 pg (ref 26.0–34.0)
MCHC: 35.4 g/dL (ref 30.0–36.0)
MCV: 83.9 fL (ref 80.0–100.0)
Platelets: 171 10*3/uL (ref 150–400)
RBC: 3.8 MIL/uL — ABNORMAL LOW (ref 4.22–5.81)
RDW: 14.2 % (ref 11.5–15.5)
WBC: 9 10*3/uL (ref 4.0–10.5)
nRBC: 0 % (ref 0.0–0.2)

## 2019-03-08 LAB — I-STAT CHEM 8, ED
BUN: 42 mg/dL — ABNORMAL HIGH (ref 6–20)
Calcium, Ion: 1.05 mmol/L — ABNORMAL LOW (ref 1.15–1.40)
Chloride: 94 mmol/L — ABNORMAL LOW (ref 98–111)
Creatinine, Ser: 9.5 mg/dL — ABNORMAL HIGH (ref 0.61–1.24)
Glucose, Bld: 56 mg/dL — ABNORMAL LOW (ref 70–99)
HCT: 38 % — ABNORMAL LOW (ref 39.0–52.0)
Hemoglobin: 12.9 g/dL — ABNORMAL LOW (ref 13.0–17.0)
Potassium: 6.5 mmol/L (ref 3.5–5.1)
Sodium: 129 mmol/L — ABNORMAL LOW (ref 135–145)
TCO2: 26 mmol/L (ref 22–32)

## 2019-03-08 LAB — COMPREHENSIVE METABOLIC PANEL
ALT: 13 U/L (ref 0–44)
AST: 27 U/L (ref 15–41)
Albumin: 3.7 g/dL (ref 3.5–5.0)
Alkaline Phosphatase: 105 U/L (ref 38–126)
Anion gap: 18 — ABNORMAL HIGH (ref 5–15)
BUN: 45 mg/dL — ABNORMAL HIGH (ref 6–20)
CO2: 23 mmol/L (ref 22–32)
Calcium: 9 mg/dL (ref 8.9–10.3)
Chloride: 90 mmol/L — ABNORMAL LOW (ref 98–111)
Creatinine, Ser: 9.74 mg/dL — ABNORMAL HIGH (ref 0.61–1.24)
GFR calc Af Amer: 6 mL/min — ABNORMAL LOW (ref >60)
GFR calc non Af Amer: 5 mL/min — ABNORMAL LOW (ref >60)
Glucose, Bld: 60 mg/dL — ABNORMAL LOW (ref 70–99)
Potassium: 6.6 mmol/L (ref 3.5–5.1)
Sodium: 131 mmol/L — ABNORMAL LOW (ref 135–145)
Total Bilirubin: 1.2 mg/dL (ref 0.3–1.2)
Total Protein: 6.6 g/dL (ref 6.5–8.1)

## 2019-03-08 LAB — CBC WITH DIFFERENTIAL/PLATELET
Abs Immature Granulocytes: 0.04 10*3/uL (ref 0.00–0.07)
Basophils Absolute: 0.1 10*3/uL (ref 0.0–0.1)
Basophils Relative: 2 %
Eosinophils Absolute: 0.1 10*3/uL (ref 0.0–0.5)
Eosinophils Relative: 1 %
HCT: 33.5 % — ABNORMAL LOW (ref 39.0–52.0)
Hemoglobin: 11.9 g/dL — ABNORMAL LOW (ref 13.0–17.0)
Immature Granulocytes: 0 %
Lymphocytes Relative: 21 %
Lymphs Abs: 2 10*3/uL (ref 0.7–4.0)
MCH: 29.5 pg (ref 26.0–34.0)
MCHC: 35.5 g/dL (ref 30.0–36.0)
MCV: 83.1 fL (ref 80.0–100.0)
Monocytes Absolute: 1 10*3/uL (ref 0.1–1.0)
Monocytes Relative: 10 %
Neutro Abs: 6.4 10*3/uL (ref 1.7–7.7)
Neutrophils Relative %: 66 %
Platelets: 160 10*3/uL (ref 150–400)
RBC: 4.03 MIL/uL — ABNORMAL LOW (ref 4.22–5.81)
RDW: 13.9 % (ref 11.5–15.5)
WBC: 9.6 10*3/uL (ref 4.0–10.5)
nRBC: 0.2 % (ref 0.0–0.2)

## 2019-03-08 LAB — RENAL FUNCTION PANEL
Albumin: 3.5 g/dL (ref 3.5–5.0)
Anion gap: 18 — ABNORMAL HIGH (ref 5–15)
BUN: 47 mg/dL — ABNORMAL HIGH (ref 6–20)
CO2: 23 mmol/L (ref 22–32)
Calcium: 9 mg/dL (ref 8.9–10.3)
Chloride: 89 mmol/L — ABNORMAL LOW (ref 98–111)
Creatinine, Ser: 9.8 mg/dL — ABNORMAL HIGH (ref 0.61–1.24)
GFR calc Af Amer: 6 mL/min — ABNORMAL LOW (ref >60)
GFR calc non Af Amer: 5 mL/min — ABNORMAL LOW (ref >60)
Glucose, Bld: 95 mg/dL (ref 70–99)
Phosphorus: 5 mg/dL — ABNORMAL HIGH (ref 2.5–4.6)
Potassium: 5.9 mmol/L — ABNORMAL HIGH (ref 3.5–5.1)
Sodium: 130 mmol/L — ABNORMAL LOW (ref 135–145)

## 2019-03-08 LAB — PROTIME-INR
INR: 1.3 — ABNORMAL HIGH (ref 0.8–1.2)
Prothrombin Time: 16 seconds — ABNORMAL HIGH (ref 11.4–15.2)

## 2019-03-08 LAB — LIPASE, BLOOD: Lipase: 21 U/L (ref 11–51)

## 2019-03-08 LAB — SARS CORONAVIRUS 2 BY RT PCR (HOSPITAL ORDER, PERFORMED IN ~~LOC~~ HOSPITAL LAB): SARS Coronavirus 2: NEGATIVE

## 2019-03-08 MED ORDER — IOHEXOL 300 MG/ML  SOLN
100.0000 mL | Freq: Once | INTRAMUSCULAR | Status: AC | PRN
  Administered 2019-03-08: 19:00:00 100 mL via INTRAVENOUS

## 2019-03-08 MED ORDER — FENTANYL CITRATE (PF) 100 MCG/2ML IJ SOLN
50.0000 ug | Freq: Once | INTRAMUSCULAR | Status: AC
  Administered 2019-03-08: 50 ug via INTRAVENOUS
  Filled 2019-03-08: qty 2

## 2019-03-08 MED ORDER — SODIUM ZIRCONIUM CYCLOSILICATE 10 G PO PACK
10.0000 g | PACK | Freq: Once | ORAL | Status: AC
  Administered 2019-03-08: 10 g via ORAL
  Filled 2019-03-08: qty 1

## 2019-03-08 MED ORDER — ONDANSETRON HCL 4 MG PO TABS: 8.0000 mg | ORAL_TABLET | Freq: Three times a day (TID) | ORAL | Status: DC | PRN

## 2019-03-08 MED ORDER — HYDROMORPHONE HCL 1 MG/ML IJ SOLN
1.0000 mg | Freq: Once | INTRAMUSCULAR | Status: AC
  Administered 2019-03-08: 1 mg via INTRAVENOUS
  Filled 2019-03-08: qty 1

## 2019-03-08 MED ORDER — ALTEPLASE 2 MG IJ SOLR: 2.0000 mg | Freq: Once | INTRAMUSCULAR | Status: DC | PRN

## 2019-03-08 MED ORDER — SEVELAMER CARBONATE 800 MG PO TABS
3200.0000 mg | ORAL_TABLET | Freq: Three times a day (TID) | ORAL | Status: DC
  Administered 2019-03-09 – 2019-03-10 (×5): 3200 mg via ORAL
  Filled 2019-03-08 (×5): qty 4

## 2019-03-08 MED ORDER — FLUTICASONE FUROATE-VILANTEROL 200-25 MCG/INH IN AEPB: 1.0000 | INHALATION_SPRAY | Freq: Every day | RESPIRATORY_TRACT | Status: DC

## 2019-03-08 MED ORDER — PENTAFLUOROPROP-TETRAFLUOROETH EX AERO
1.0000 "application " | INHALATION_SPRAY | CUTANEOUS | Status: DC | PRN
  Filled 2019-03-08: qty 116

## 2019-03-08 MED ORDER — DEXTROSE 50 % IV SOLN
1.0000 | Freq: Once | INTRAVENOUS | Status: AC
  Administered 2019-03-08: 50 mL via INTRAVENOUS
  Filled 2019-03-08: qty 50

## 2019-03-08 MED ORDER — LIDOCAINE-PRILOCAINE 2.5-2.5 % EX CREA
1.0000 "application " | TOPICAL_CREAM | CUTANEOUS | Status: DC | PRN
  Filled 2019-03-08: qty 5

## 2019-03-08 MED ORDER — CHLORHEXIDINE GLUCONATE CLOTH 2 % EX PADS: 6.0000 | MEDICATED_PAD | Freq: Every day | CUTANEOUS | Status: DC

## 2019-03-08 MED ORDER — HYDROXYZINE HCL 25 MG PO TABS
25.0000 mg | ORAL_TABLET | Freq: Two times a day (BID) | ORAL | Status: DC | PRN
  Administered 2019-03-09 (×2): 25 mg via ORAL
  Filled 2019-03-08 (×3): qty 1

## 2019-03-08 MED ORDER — LACTULOSE 10 GM/15ML PO SOLN
45.0000 g | Freq: Two times a day (BID) | ORAL | Status: DC
  Filled 2019-03-08 (×5): qty 90

## 2019-03-08 MED ORDER — ONDANSETRON HCL 4 MG/2ML IJ SOLN
4.0000 mg | Freq: Once | INTRAMUSCULAR | Status: AC
  Administered 2019-03-08: 4 mg via INTRAVENOUS
  Filled 2019-03-08: qty 2

## 2019-03-08 MED ORDER — SODIUM CHLORIDE 0.9 % IV SOLN: 100.0000 mL | INTRAVENOUS | Status: DC | PRN

## 2019-03-08 MED ORDER — CALCIUM GLUCONATE 10 % IV SOLN
1.0000 g | Freq: Once | INTRAVENOUS | Status: AC
  Administered 2019-03-08: 20:00:00 1 g via INTRAVENOUS
  Filled 2019-03-08: qty 10

## 2019-03-08 MED ORDER — MONTELUKAST SODIUM 10 MG PO TABS
10.0000 mg | ORAL_TABLET | Freq: Every day | ORAL | Status: DC
  Administered 2019-03-09 – 2019-03-10 (×2): 10 mg via ORAL
  Filled 2019-03-08 (×2): qty 1

## 2019-03-08 MED ORDER — ALBUTEROL SULFATE (2.5 MG/3ML) 0.083% IN NEBU: 3.0000 mL | INHALATION_SOLUTION | Freq: Four times a day (QID) | RESPIRATORY_TRACT | Status: DC | PRN

## 2019-03-08 MED ORDER — HEPARIN SODIUM (PORCINE) 1000 UNIT/ML DIALYSIS
1000.0000 [IU] | INTRAMUSCULAR | Status: DC | PRN
  Filled 2019-03-08: qty 1

## 2019-03-08 MED ORDER — PANTOPRAZOLE SODIUM 40 MG PO TBEC: 40.0000 mg | DELAYED_RELEASE_TABLET | ORAL | Status: DC

## 2019-03-08 MED ORDER — LIDOCAINE HCL (PF) 1 % IJ SOLN: 5.0000 mL | INTRAMUSCULAR | Status: DC | PRN

## 2019-03-08 MED ORDER — HEPARIN SODIUM (PORCINE) 5000 UNIT/ML IJ SOLN
5000.0000 [IU] | Freq: Three times a day (TID) | INTRAMUSCULAR | Status: DC
  Administered 2019-03-10: 5000 [IU] via SUBCUTANEOUS
  Filled 2019-03-08 (×4): qty 1

## 2019-03-08 MED ORDER — METOPROLOL TARTRATE 25 MG PO TABS
25.0000 mg | ORAL_TABLET | Freq: Two times a day (BID) | ORAL | Status: DC
  Administered 2019-03-09 – 2019-03-10 (×4): 25 mg via ORAL
  Filled 2019-03-08 (×4): qty 1

## 2019-03-08 MED ORDER — ZOLPIDEM TARTRATE 5 MG PO TABS
5.0000 mg | ORAL_TABLET | Freq: Every day | ORAL | Status: DC
  Administered 2019-03-09 (×2): 5 mg via ORAL
  Filled 2019-03-08 (×2): qty 1

## 2019-03-08 MED ORDER — HYDRALAZINE HCL 50 MG PO TABS
100.0000 mg | ORAL_TABLET | Freq: Every day | ORAL | Status: DC
  Administered 2019-03-09 – 2019-03-10 (×2): 100 mg via ORAL
  Filled 2019-03-08 (×2): qty 2

## 2019-03-08 MED ORDER — DIPHENHYDRAMINE HCL 50 MG/ML IJ SOLN
25.0000 mg | Freq: Once | INTRAMUSCULAR | Status: AC
  Administered 2019-03-08: 20:00:00 25 mg via INTRAVENOUS
  Filled 2019-03-08: qty 1

## 2019-03-08 NOTE — Telephone Encounter (Signed)
Spoke with patient and have scheduled a Telephone Palliative f/u visit for 03/13/19 @ 1 PM

## 2019-03-08 NOTE — Telephone Encounter (Signed)
I did discuss his evaluation by [facility] with Roosevelt Locks, NP. They did not feel like he would be liver transplant candidate due to his severe mitral stenosis and PA pressure gradient of 91 mmHg. The transplant would not be able to be tolerated due to his heart.  In other words, he would not survive the transplant.  He met with Dr. Vernard Gambles with IR regarding TIPS placement to control his ascites.  This will be technically possible. I have some concern when we redirect blood flow through the TIP shunt this may increase right heart pressures, which can also be a big problem.  This could also cause heart failure, worsening liver injury/failure and even death.  Thus, while I remain open to TIPS possibility, he needs to be seen by cardiology to eval his mitral stenosis and elevated PA pressures, specifically as it relates to possible TIPS.  Please explain that I am not "running him around", but trying my best to take the best care of him I can.  I know he is frustrated by the recurrent ascites. Until cards eval and decision on TIPS he will need scheduled LVPs with cell count each time.  Thank you

## 2019-03-08 NOTE — ED Provider Notes (Signed)
[facility] EMERGENCY DEPARTMENT Provider Note   CSN: [id number] Arrival date & time: 03/08/19  1459     History   Chief Complaint Chief Complaint  Patient presents with   Weakness    HPI [name] is a 58 y.o. male with past medical history significant for ESRD on dialysis at [facility] MWF, CAD, hypertension, hyperkalemia, Hep C mitral stenosis presents emergency department today with chief complaint of abdominal pain and distention.  Patient states his last paracentesis was 1 week ago.  Yesterday while he was lying in bed he felt a sharp pain on his left lower quadrant.  Since then it has progressively worsened.  He rates the pain 10 out of 10 in severity.  It does not radiate.  He did not take anything for pain prior to arrival.  He states he was unable to finish his dialysis session yesterday because his abdominal pain was too intense, he completed 3 hours.  He admits to feeling short of breath since leaving dialysis yesterday.  He denies fever, chills, chest pain, emesis, diarrhea, lower extremity edema.  Chart review shows patient had paracentesis on 03/01/2019.  Approximately 4 L of light amber fluid were removed.  Past Medical History:  Diagnosis Date   Anemia    Anxiety    Arthritis    left shoulder   Atherosclerosis of aorta (HCC)    Cardiomegaly    Chest pain    DATE UNKNOWN, C/O PERIODICALLY   Cocaine abuse (HCC)    COPD exacerbation (HCC) 08/17/2016   Coronary artery disease    stent 02/22/17   ESRD (end stage renal disease) on dialysis ([place])    "[name]; MWF" (07/04/2017)   GERD (gastroesophageal reflux disease)    DATE UNKNOWN   Hemorrhoids    Hepatitis B, chronic (HCC)    Hepatitis C    History of kidney stones    Hyperkalemia    Hypertension    Kidney failure    Metabolic bone disease    Patient denies   Mitral stenosis    Myocardial infarction [facility])    Pneumonia    Pulmonary edema     Solitary rectal ulcer syndrome 07/2017   at flex sig for rectal bleeding   Tubular adenoma of colon     Patient Active Problem List   Diagnosis Date Noted   Volume overload 12/28/2018   Sepsis ([place]) 09/12/2018   Atherosclerosis of native coronary artery of native heart without angina pectoris 03/11/2018   Benign neoplasm of cecum    Benign neoplasm of ascending colon    Benign neoplasm of descending colon    Benign neoplasm of rectum    Paroxysmal atrial fibrillation ([place]) 01/23/2018   Hx of colonic polyps 01/20/2018   ESRD (end stage renal disease) ([place]) 11/21/2017   GERD (gastroesophageal reflux disease) 11/16/2017   Liver cirrhosis ([place]) 11/15/2017   DNR (do not resuscitate)    Palliative care by specialist    Hyponatremia 11/04/2017   SBP (spontaneous bacterial peritonitis) ([place]) 10/30/2017   Liver disease, chronic 10/30/2017   SOB (shortness of breath)    Abdominal pain 10/28/2017   Upper airway cough syndrome with flattening on f/v loop 10/13/17 c/w vcd 10/17/2017   Elevated diaphragm 10/13/2017   Ileus ([place]) 09/29/2017   Malnutrition of moderate degree 09/29/2017   Sinus congestion 09/03/2017   Symptomatic anemia 09/02/2017   Other cirrhosis of liver ([place]) 09/02/2017   Left bundle branch block 09/02/2017   Mitral stenosis  09/02/2017   Hematochezia 07/15/2017   Wide-complex tachycardia (HCC)    Endotracheally intubated    ESRD on dialysis ([place]) 07/04/2017   Acute respiratory failure with hypoxia (HCC) 06/18/2017   CKD (chronic kidney disease) stage V requiring chronic dialysis ([place]) 06/18/2017   History of Cocaine abuse ([place]) 06/18/2017   Hypertension 06/18/2017   Infection of AV graft for dialysis ([place]) 06/18/2017   Anxiety 06/18/2017   Anemia due to chronic kidney disease 06/18/2017   Atrial flutter with rapid ventricular response ([place]) 06/18/2017   Personality disorder ([place]) 06/13/2017   Cellulitis 06/12/2017    Adjustment disorder with mixed anxiety and depressed mood 06/10/2017   Suicidal ideation 06/10/2017   Arm wound, left, sequela 06/10/2017   Dyspnea on exertion 05/29/2017   Tachycardia 05/29/2017   Hyperkalemia 43/15/4008   Acute metabolic encephalopathy    Anemia 04/23/2017   Ascites 04/23/2017   COPD (chronic obstructive pulmonary disease) ([place]) 04/23/2017   Acute on chronic respiratory failure with hypoxia ([place]) 03/25/2017   Arrhythmia 03/25/2017   COPD GOLD 0 with flattening on inps f/v  09/27/2016   Essential hypertension 09/27/2016   Fluid overload 08/30/2016   COPD exacerbation ([place]) 08/17/2016   Hypertensive urgency 08/17/2016   Respiratory failure ([place]) 08/17/2016   Problem with dialysis access ([place]) 07/23/2016   Chronic hepatitis B ([place]) 03/05/2014   Chronic hepatitis C without hepatic coma ([place]) 03/05/2014   Internal hemorrhoids with bleeding, swelling and itching 03/05/2014   Thrombocytopenia ([place]) 03/05/2014   Chest pain 02/27/2014   Alcohol abuse 04/14/2009   Cigarette smoker 04/14/2009   GANGLION CYST 04/14/2009    Past Surgical History:  Procedure Laterality Date   A/V FISTULAGRAM Left 05/26/2017   Procedure: A/V FISTULAGRAM;  Surgeon: Conrad Albert Lea, MD;  Location: [place] [facility];  Service: Cardiovascular;  Laterality: Left;   A/V FISTULAGRAM Right 11/18/2017   Procedure: A/V FISTULAGRAM - Right Arm;  Surgeon: Elam Dutch, MD;  Location: [place] [facility];  Service: Cardiovascular;  Laterality: Right;   APPLICATION OF WOUND VAC Left 06/14/2017   Procedure: APPLICATION OF WOUND VAC;  Surgeon: Katha Cabal, MD;  Location: [facility] ORS;  Service: Vascular;  Laterality: Left;   AV FISTULA PLACEMENT  2012   BELIEVED WAS PLACED IN JUNE   AV FISTULA PLACEMENT Right 08/09/2017   Procedure: Creation Right arm ARTERIOVENOUS BRACHIOCEPOHALIC FISTULA;  Surgeon: Elam Dutch, MD;  Location: [facility] OR;  Service: Vascular;   Laterality: Right;   AV FISTULA PLACEMENT Right 11/22/2017   Procedure: INSERTION OF ARTERIOVENOUS (AV) GORE-TEX GRAFT RIGHT UPPER ARM;  Surgeon: Elam Dutch, MD;  Location: [place];  Service: Vascular;  Laterality: Right;   BIOPSY  01/25/2018   Procedure: BIOPSY;  Surgeon: Jerene Bears, MD;  Location: [place];  Service: Gastroenterology;;   COLONOSCOPY     COLONOSCOPY WITH PROPOFOL N/A 01/25/2018   Procedure: COLONOSCOPY WITH PROPOFOL;  Surgeon: Jerene Bears, MD;  Location: [place];  Service: Gastroenterology;  Laterality: N/A;   CORONARY STENT INTERVENTION N/A 02/22/2017   Procedure: CORONARY STENT INTERVENTION;  Surgeon: Nigel Mormon, MD;  Location: [place] [facility];  Service: Cardiovascular;  Laterality: N/A;   ESOPHAGOGASTRODUODENOSCOPY (EGD) WITH PROPOFOL N/A 01/25/2018   Procedure: ESOPHAGOGASTRODUODENOSCOPY (EGD) WITH PROPOFOL;  Surgeon: Jerene Bears, MD;  Location: [place];  Service: Gastroenterology;  Laterality: N/A;   FLEXIBLE SIGMOIDOSCOPY N/A 07/15/2017   Procedure: FLEXIBLE SIGMOIDOSCOPY;  Surgeon: Carol Ada, MD;  Location: [place];  Service: Endoscopy;  Laterality: N/A;   HEMORRHOID BANDING     I&D EXTREMITY Left 06/01/2017   Procedure: IRRIGATION AND DEBRIDEMENT LEFT ARM HEMATOMA WITH LIGATION OF LEFT ARM AV FISTULA;  Surgeon: Elam Dutch, MD;  Location: [place];  Service: Vascular;  Laterality: Left;   I&D EXTREMITY Left 06/14/2017   Procedure: IRRIGATION AND DEBRIDEMENT EXTREMITY;  Surgeon: Katha Cabal, MD;  Location: [facility];  Service: Vascular;  Laterality: Left;   INSERTION OF DIALYSIS CATHETER  05/30/2017   INSERTION OF DIALYSIS CATHETER N/A 05/30/2017   Procedure: INSERTION OF DIALYSIS CATHETER;  Surgeon: Elam Dutch, MD;  Location: [place];  Service: Vascular;  Laterality: N/A;   IR PARACENTESIS  08/30/2017   IR PARACENTESIS  09/29/2017   IR PARACENTESIS  10/28/2017   IR PARACENTESIS  11/09/2017   IR  PARACENTESIS  11/16/2017   IR PARACENTESIS  11/28/2017   IR PARACENTESIS  12/01/2017   IR PARACENTESIS  12/06/2017   IR PARACENTESIS  01/03/2018   IR PARACENTESIS  01/23/2018   IR PARACENTESIS  02/07/2018   IR PARACENTESIS  02/21/2018   IR PARACENTESIS  03/06/2018   IR PARACENTESIS  03/17/2018   IR PARACENTESIS  04/04/2018   IR PARACENTESIS  12/28/2018   IR PARACENTESIS  01/08/2019   IR PARACENTESIS  01/23/2019   IR PARACENTESIS  02/01/2019   IR PARACENTESIS  02/19/2019   IR PARACENTESIS  03/01/2019   IR RADIOLOGIST EVAL & MGMT  02/14/2018   IR RADIOLOGIST EVAL & MGMT  02/22/2019   LEFT HEART CATH AND CORONARY ANGIOGRAPHY N/A 02/22/2017   Procedure: LEFT HEART CATH AND CORONARY ANGIOGRAPHY;  Surgeon: Nigel Mormon, MD;  Location: [place] [facility];  Service: Cardiovascular;  Laterality: N/A;   LIGATION OF ARTERIOVENOUS  FISTULA Left 07/17/1094   Procedure: Plication of Left Arm Arteriovenous Fistula;  Surgeon: Elam Dutch, MD;  Location[facility];  Service: Vascular;  Laterality: Left;   POLYPECTOMY     POLYPECTOMY  01/25/2018   Procedure: POLYPECTOMY;  Surgeon: Jerene Bears, MD;  Location: [place];  Service: Gastroenterology;;   REVISON OF ARTERIOVENOUS FISTULA Left 0/45/4098   Procedure: PLICATION OF DISTAL ANEURYSMAL SEGEMENT OF LEFT UPPER ARM ARTERIOVENOUS FISTULA;  Surgeon: Elam Dutch, MD;  Location: [place];  Service: Vascular;  Laterality: Left;   REVISON OF ARTERIOVENOUS FISTULA Left 07/30/1476   Procedure: Plication of Left Upper Arm Fistula ;  Surgeon: Waynetta Sandy, MD;  Location: [place];  Service: Vascular;  Laterality: Left;   SKIN GRAFT SPLIT THICKNESS LEG / FOOT Left    SKIN GRAFT SPLIT THICKNESS LEFT ARM DONOR SITE: LEFT ANTERIOR THIGH   SKIN SPLIT GRAFT Left 07/04/2017   Procedure: SKIN GRAFT SPLIT THICKNESS LEFT ARM DONOR SITE: LEFT ANTERIOR THIGH;  Surgeon: Elam Dutch, MD;  Location: [place];  Service: Vascular;  Laterality: Left;     THROMBECTOMY W/ EMBOLECTOMY Left 06/05/2017   Procedure: EXPLORATION OF LEFT ARM FOR BLEEDING; OVERSEWED PROXIMAL FISTULA;  Surgeon: Angelia Mould, MD;  Location: [place];  Service: Vascular;  Laterality: Left;   WOUND EXPLORATION Left 06/03/2017   Procedure: WOUND EXPLORATION WITH WOUND VAC APPLICATION TO LEFT ARM;  Surgeon: Angelia Mould, MD;  Location: [facility] OR;  Service: Vascular;  Laterality: Left;        Home Medications    Prior to Admission medications   Medication Sig Start Date End Date Taking? Authorizing Provider  albuterol (VENTOLIN HFA) 108 (90 Base) MCG/ACT inhaler Inhale 2 puffs  into the lungs every 6 (six) hours as needed for wheezing or shortness of breath.   Yes [provider]  hydrALAZINE (APRESOLINE) 100 MG tablet Take 100 mg by mouth daily. 02/13/19  Yes [provider]  hydrOXYzine (ATARAX/VISTARIL) 25 MG tablet Take 25 mg by mouth 2 (two) times daily as needed. 02/14/19  Yes [provider]  lactulose (CHRONULAC) 10 GM/15ML solution TAKE 45 MLS BY MOUTH 2 TIMES DAILY. Patient taking differently: Take 45 g by mouth 2 (two) times daily.  01/30/19  Yes Pyrtle, Lajuan Lines, MD  metoprolol tartrate (LOPRESSOR) 25 MG tablet Take 1 tablet (25 mg total) by mouth 2 (two) times daily. 03/11/18  Yes Vann, Jessica U, DO  montelukast (SINGULAIR) 10 MG tablet Take 10 mg by mouth daily.   Yes [provider]  ondansetron (ZOFRAN) 8 MG tablet Take 8 mg by mouth every 8 (eight) hours as needed for nausea or vomiting.    Yes [provider]  pantoprazole (PROTONIX) 40 MG tablet Take 1 tablet (40 mg total) by mouth See admin instructions. Take one tablet (40 mg) by mouth in the morning on Sunday, Tuesday, Thursday, Saturday, take one tablet (40 mg) after dialysis on Monday, Wednesday, Friday. May also take one tablet (40 mg) in the evening as needed for acid reflux. 12/26/18  Yes Esterwood, Amy S, PA-C  sevelamer carbonate (RENVELA) 800 MG  tablet Take 4 tablets with meals  And 2 tablets twice daily with snacks Patient taking differently: Take 1,600-3,200 mg by mouth See admin instructions. Take 3200 mg three time a day with meals, and 1600 mg twice daily with snacks 01/22/18  Yes Patrecia Pour, MD  sulfamethoxazole-trimethoprim (BACTRIM DS) 800-160 MG tablet Take 1 tablet by mouth daily. 12/30/18  Yes Vann, Jessica U, DO  SYMBICORT 160-4.5 MCG/ACT inhaler Inhale 2 puffs into the lungs 2 (two) times a day. 11/30/18  Yes [provider]  zolpidem (AMBIEN) 5 MG tablet Take 5 mg by mouth at bedtime. 12/08/18  Yes [provider]    Family History Family History  Problem Relation Age of Onset   Heart disease Mother    Lung cancer Mother    Heart disease Father    Malignant hyperthermia Father    COPD Father    Throat cancer Sister    Esophageal cancer Sister    Hypertension Other    COPD Other    Colon cancer Neg Hx    Colon polyps Neg Hx    Rectal cancer Neg Hx    Stomach cancer Neg Hx     Social History Social History   Tobacco Use   Smoking status: Current Every Day Smoker    Packs/day: 0.50    Years: 43.00    Pack years: 21.50    Types: Cigarettes    Start date: 08/13/1973   Smokeless tobacco: Never Used  Substance Use Topics   Alcohol use: Not Currently    Frequency: Never    Comment: quit drinking in 2017   Drug use: Yes    Types: Marijuana, Cocaine    Comment: reports using once every 3 months     Allergies   Morphine and related, Aspirin, Clonidine derivatives, Tramadol, and Tylenol [acetaminophen]   Review of Systems Review of Systems  Constitutional: Negative for chills and fever.  HENT: Negative for congestion, rhinorrhea, sinus pressure and sore throat.   Eyes: Negative for pain and redness.  Respiratory: Negative for cough, shortness of breath and wheezing.  Cardiovascular: Negative for chest pain and palpitations.  Gastrointestinal: Positive for abdominal  distention, abdominal pain and nausea. Negative for constipation, diarrhea and vomiting.  Genitourinary: Negative for dysuria.  Musculoskeletal: Negative for arthralgias, back pain, myalgias and neck pain.  Skin: Negative for rash and wound.  Neurological: Positive for weakness (generalized). Negative for dizziness, syncope, numbness and headaches.  Psychiatric/Behavioral: Negative for confusion.     Physical Exam Updated Vital Signs BP (!) 139/101 (BP Location: Right Arm)    Pulse 94    Temp (!) 97.5 F (36.4 C) (Oral)    Resp 18    Ht 5\' 10"  (1.778 m)    Wt 63.5 kg    SpO2 100%    BMI 20.09 kg/m   Physical Exam Vitals signs and nursing note reviewed.  Constitutional:      General: He is not in acute distress.    Appearance: He is not ill-appearing or toxic-appearing.  HENT:     Head: Normocephalic and atraumatic.     Right Ear: Tympanic membrane and external ear normal.     Left Ear: Tympanic membrane and external ear normal.     Nose: Nose normal.     Mouth/Throat:     Mouth: Mucous membranes are moist.     Pharynx: Oropharynx is clear.  Eyes:     General: No scleral icterus.       Right eye: No discharge.        Left eye: No discharge.     Extraocular Movements: Extraocular movements intact.     Conjunctiva/sclera: Conjunctivae normal.     Pupils: Pupils are equal, round, and reactive to light.  Neck:     Musculoskeletal: Normal range of motion.     Vascular: No JVD.  Cardiovascular:     Rate and Rhythm: Normal rate and regular rhythm.     Pulses: Normal pulses.          Radial pulses are 2+ on the right side and 2+ on the left side.     Heart sounds: Normal heart sounds.  Pulmonary:     Comments: Lungs clear to auscultation in all fields. Symmetric chest rise. No wheezing, rales, or rhonchi. Abdominal:     Comments: Abdomen is distended. No rigidity, no guarding. No peritoneal signs.  Musculoskeletal: Normal range of motion.  Skin:    General: Skin is warm and  dry.     Capillary Refill: Capillary refill takes less than 2 seconds.  Neurological:     Mental Status: He is oriented to person, place, and time.     GCS: GCS eye subscore is 4. GCS verbal subscore is 5. GCS motor subscore is 6.     Comments: Fluent speech, no facial droop.  Psychiatric:        Behavior: Behavior normal.      ED Treatments / Results  Labs (all labs ordered are listed, but only abnormal results are displayed) Labs Reviewed  COMPREHENSIVE METABOLIC PANEL - Abnormal; Notable for the following components:      Result Value   Sodium 131 (*)    Potassium 6.6 (*)    Chloride 90 (*)    Glucose, Bld 60 (*)    BUN 45 (*)    Creatinine, Ser 9.74 (*)    GFR calc non Af Amer 5 (*)    GFR calc Af Amer 6 (*)    Anion gap 18 (*)    All other components within normal limits  CBC WITH DIFFERENTIAL/PLATELET -  Abnormal; Notable for the following components:   RBC 4.03 (*)    Hemoglobin 11.9 (*)    HCT 33.5 (*)    All other components within normal limits  PROTIME-INR - Abnormal; Notable for the following components:   Prothrombin Time 16.0 (*)    INR 1.3 (*)    All other components within normal limits  CBC - Abnormal; Notable for the following components:   RBC 3.80 (*)    Hemoglobin 11.3 (*)    HCT 31.9 (*)    All other components within normal limits  RENAL FUNCTION PANEL - Abnormal; Notable for the following components:   Sodium 130 (*)    Potassium 5.9 (*)    Chloride 89 (*)    BUN 47 (*)    Creatinine, Ser 9.80 (*)    Phosphorus 5.0 (*)    GFR calc non Af Amer 5 (*)    GFR calc Af Amer 6 (*)    Anion gap 18 (*)    All other components within normal limits  I-STAT CHEM 8, ED - Abnormal; Notable for the following components:   Sodium 129 (*)    Potassium 6.5 (*)    Chloride 94 (*)    BUN 42 (*)    Creatinine, Ser 9.50 (*)    Glucose, Bld 56 (*)    Calcium, Ion 1.05 (*)    Hemoglobin 12.9 (*)    HCT 38.0 (*)    All other components within normal limits   SARS CORONAVIRUS 2 (HOSPITAL ORDER, [place] [facility])  LIPASE, BLOOD  BASIC METABOLIC PANEL  BASIC METABOLIC PANEL  BASIC METABOLIC PANEL    EKG EKG Interpretation  Date/Time:  Thursday March 08 2019 15:00:19 EDT Ventricular Rate:  141 PR Interval:    QRS Duration: 180 QT Interval:  384 QTC Calculation: 589 R Axis:   -89 Text Interpretation:  Sinus rhythm Left bundle branch block Confirmed by Lajean Saver [phone]) on 03/08/2019 3:05:03 PM   Radiology Ct Abdomen Pelvis W Contrast  Result Date: 03/08/2019 CLINICAL DATA:  Abdominal pain, nausea, vomiting EXAM: CT ABDOMEN AND PELVIS WITH CONTRAST TECHNIQUE: Multidetector CT imaging of the abdomen and pelvis was performed using the standard protocol following bolus administration of intravenous contrast. CONTRAST:  153mL OMNIPAQUE IOHEXOL 300 MG/ML  SOLN COMPARISON:  CT 12/17/2018 FINDINGS: Lower chest: Cardiomegaly. Small pericardial effusion. Calcifications throughout the visualized right coronary artery. Lung bases clear. Hepatobiliary: Changes of cirrhosis. No focal hepatic abnormality. Gallbladder grossly unremarkable. Pancreas: No focal abnormality or ductal dilatation. Spleen: No focal abnormality.  Normal size. Adrenals/Urinary Tract: Severely atrophic kidneys, likely related to end-stage renal disease. Extensive renovascular calcifications. No hydronephrosis. Adrenal glands and urinary bladder unremarkable. Stomach/Bowel: Stomach, large and small bowel grossly unremarkable. Clip again noted within the rectum. Vascular/Lymphatic: No evidence of aneurysm or adenopathy. Diffuse aortic and branch vessel atherosclerosis. Reproductive: No visible focal abnormality. Other: Marked large volume ascites throughout the abdomen and pelvis, stable. Musculoskeletal: No acute bony abnormality. Degenerative disc disease at L5-S1. IMPRESSION: Cirrhosis with large volume ascites again noted, stable since prior study. Severely atrophic  kidneys bilaterally with extensive renovascular calcifications. Aortic and branch vessel atherosclerosis. Cardiomegaly, small pericardial effusion. Electronically Signed   By: Rolm Baptise M.D.   On: 03/08/2019 19:49    Procedures .Critical Care Performed by: Cherre Robins, PA-C Authorized by: Cherre Robins, PA-C   Critical care provider statement:    Critical care time (minutes):  46   Critical care was  necessary to treat or prevent imminent or life-threatening deterioration of the following conditions:  Metabolic crisis   Critical care was time spent personally by me on the following activities:  Discussions with consultants, evaluation of patient's response to treatment, examination of patient, ordering and review of laboratory studies, ordering and review of radiographic studies, pulse oximetry, re-evaluation of patient's condition, obtaining history from patient or surrogate and review of old charts   (including critical care time)  Medications Ordered in ED Medications  Chlorhexidine Gluconate Cloth 2 % PADS 6 each (has no administration in time range)  pentafluoroprop-tetrafluoroeth (GEBAUERS) aerosol 1 application (has no administration in time range)  lidocaine (PF) (XYLOCAINE) 1 % injection 5 mL (has no administration in time range)  lidocaine-prilocaine (EMLA) cream 1 application (has no administration in time range)  0.9 %  sodium chloride infusion (has no administration in time range)  0.9 %  sodium chloride infusion (has no administration in time range)  heparin injection 1,000 Units (has no administration in time range)  alteplase (CATHFLO ACTIVASE) injection 2 mg (has no administration in time range)  hydrALAZINE (APRESOLINE) tablet 100 mg (has no administration in time range)  metoprolol tartrate (LOPRESSOR) tablet 25 mg (has no administration in time range)  hydrOXYzine (ATARAX/VISTARIL) tablet 25 mg (has no administration in time range)  zolpidem (AMBIEN)  tablet 5 mg (has no administration in time range)  lactulose (CHRONULAC) 10 GM/15ML solution 45 g (has no administration in time range)  ondansetron (ZOFRAN) tablet 8 mg (has no administration in time range)  pantoprazole (PROTONIX) EC tablet 40 mg (has no administration in time range)  sevelamer carbonate (RENVELA) tablet 1,600-3,200 mg (has no administration in time range)  albuterol (PROVENTIL) (2.5 MG/3ML) 0.083% nebulizer solution 3 mL (has no administration in time range)  montelukast (SINGULAIR) tablet 10 mg (has no administration in time range)  fluticasone furoate-vilanterol (BREO ELLIPTA) 200-25 MCG/INH 1 puff (1 puff Inhalation Not Given 03/08/19 2200)  heparin injection 5,000 Units (has no administration in time range)  fentaNYL (SUBLIMAZE) injection 50 mcg (50 mcg Intravenous Given 03/08/19 1858)  ondansetron (ZOFRAN) injection 4 mg (4 mg Intravenous Given 03/08/19 1857)  sodium zirconium cyclosilicate (LOKELMA) packet 10 g (10 g Oral Given 03/08/19 1959)  calcium gluconate inj 10% (1 g) URGENT USE ONLY! (1 g Intravenous Given 03/08/19 1959)  dextrose 50 % solution 50 mL (50 mLs Intravenous Given 03/08/19 1959)  iohexol (OMNIPAQUE) 300 MG/ML solution 100 mL (100 mLs Intravenous Contrast Given 03/08/19 1925)  HYDROmorphone (DILAUDID) injection 1 mg (1 mg Intravenous Given 03/08/19 1959)  ondansetron (ZOFRAN) injection 4 mg (4 mg Intravenous Given 03/08/19 2008)  diphenhydrAMINE (BENADRYL) injection 25 mg (25 mg Intravenous Given 03/08/19 2012)  HYDROmorphone (DILAUDID) injection 1 mg (1 mg Intravenous Given 03/08/19 2213)     Initial Impression / Assessment and Plan / ED Course  I have reviewed the triage vital signs and the nursing notes.  Pertinent labs & imaging results that were available during my care of the patient were reviewed by me and considered in my medical decision making (see chart for details).  59 year old male presents with abdominal distention. He went to dialysis  yesterday however did not complete the session.  He has history of cirrhosis and has been getting paracentesis every other week.  His last one was 1 week ago.  On arrival to the ED he is afebrile, in no acute distress.  Nontoxic in appearance.  He has tenderness to left lower quadrant without peritoneal signs.  Labs show patient has potassium of 6.5, glucose of 56.  Creatinine appears close to baseline. Patient given Lokelma and calcium gluconate as well as D50 for hyperglycemia.  EKG is without ischemic changes, no peaked T waves.  CBC is without leukocytosis, hemoglobin is stable. CT A/P shows cirrhosis with large volume ascites again noted, stable compared to prior study.  No acute findings.  Case discussed with nephrology Dr. Marval Regal who will be down to see the pt and arrange dialysis.  Case also discussed with family medicine service who agrees to see him care admit the patient to the hospital.  This was a shared visit with the ED attending Dr. Ashok Cordia.  He has personally seen and evaluated the patient.  He agrees with plan for admission.  This note was prepared using Dragon voice recognition software and may include unintentional dictation errors due to the inherent limitations of voice recognition software.    Final Clinical Impressions(s) / ED Diagnoses   Final diagnoses:  Hyperkalemia  Abdominal distention    ED Discharge Orders    None       Cherre Robins, PA-C 03/08/19 [id number]    Lajean Saver, MD 03/09/19 [phone]

## 2019-03-08 NOTE — Telephone Encounter (Signed)
Unable to get through on phone number provided.

## 2019-03-08 NOTE — H&P (Addendum)
[facility] Admission History and Physical Service Pager: [phone]  Patient name: [name] Medical record number: [id number] Date of birth: 07-08-1961 Age: 58 y.o. Gender: male  Primary Care Provider: [name], FNP Consultants:Nephrology, GI, IR Code Status:DNR  Chief Complaint: Abdominal pain  Assessment and Plan: [name] is a 58 y.o. male presenting with increasing abdominal pain. PMH is significant for ESRD on dialysis Monday Wednesday Friday, CAD, hypertension, hyperkalemia, cirrhosis.  Hyperkalemia due to incomplete HD session Potassium on admission was 6.5.  Patient has a history of missions due to hyperkalemia in the past with the most recent being 01/01/2019.  Patient given calcium gluconate, Lokelma, dextrose in the ED.  Insulin was held due to hypoglycemia. - Continue calcium gluconate, Lokelma,  - BMPs every 4 hours -Cardiac monitoring  Abdominal pain with ascites in setting of cirrhosis 2/2 Chronic Hep B and C Patient has had increasing abdominal pain over the last few days.  He ended his dialysis yesterday early due to the abdominal pain.  He reports feeling a large pop in the left lower quadrant yesterday.  The pain did not resolve the day so he came to the emergency room.  CT abdomen showed cirrhosis with large volume ascites again noted, stable since prior study.  Severe atrophic kidneys bilaterally with extensive renovascular calcifications.  Aortic and branch vessel atherosclerosis.  Cardiomegaly, small pericardial effusion.  EKG wide-complex QTC of 589. Abdominal pain most likely due to ascites Due to chronic liver failure.  Patient often gets paracentesis every other week.  No sign of bowel obstruction CT. - Admit to FPTS with Dr. Andria Frames as attending - Consult GI for further recs regarding cirrhosis - Consult renal for evaluation for emergent hemodialysis -Consult IR for evaluation for paracentesis versus  TIPS with GI  Cirrhosis from hep B/hep C GI wrote a note today regarding the patient.  He said that he spoke with liver care and that the patient is not a candidate for transplant due to his mitral stenosis and PA pressure gradient of 91 mmHg.  IR was consulted about TIPS placement to control ascites.  There is concern that this may not work due to pressure radiance.  He goes on to recommend until a decision on TIPS is made he needs scheduled LVP's with cell counts. -Consider consulting GI in the morning for scheduling LVP - If considering TIPS will require cardiology consult  ESRD on HD MWF Patient with history of noncompliance. -Nephro consulted in ED for about emergent dialysis today  CAD Patient currently on no medications for coronary artery disease.  Last lipid panel done 08/17/2016. - Consider cardiology consult for medical optimization  HTN Blood pressures have ranged from 97/74-139/101 since admission.  Patient home meds include hydralazine 100 mg daily, metoprolol 25 mg twice a day - Continue home medications while hospitalized  COPD Patient on Symbicort at home 2 puffs twice daily -Breo Ellipta 200-25 MCG/INH 1 puff daily  FEN/GI: Renal Limited fluid Prophylaxis: Heparin  Disposition: Pending nephrology, GI, IR  History of Present Illness:  [name] is a 58 y.o. male presenting with abdominal pain and hyperkalemia.  Patient refused to answer any questions because he says "I am not the question type of person".  Per limited history from patient, chart review, and emergency room provider patient reported because he was at dialysis yesterday and not complete dialysis due to increasing abdominal pain.  He says that after dialysis yesterday he felt a large  pop in his left lower quadrant but he is unsure of what it was.  He came in today because the pain had not resolved.  He is a Monday Wednesday Friday dialysis patient with compliance issues.  He also has cirrhosis with  ascites.  He had a paracentesis approximately 1 week ago where they removed 4 L of straw-colored fluid.  After presentation today patient was advertently found to have a potassium of 6.5.  Review Of Systems:  ROS  Patient reports abdominal pain, denies shortness of breath chest pain, headaches.  Limited review of systems due to patient's refusal to answer any more questions.  Patient Active Problem List   Diagnosis Date Noted  . Volume overload 12/28/2018  . Sepsis ([place]) 09/12/2018  . Atherosclerosis of native coronary artery of native heart without angina pectoris 03/11/2018  . Benign neoplasm of cecum   . Benign neoplasm of ascending colon   . Benign neoplasm of descending colon   . Benign neoplasm of rectum   . Paroxysmal atrial fibrillation ([place]) 01/23/2018  . Hx of colonic polyps 01/20/2018  . ESRD (end stage renal disease) ([place]) 11/21/2017  . GERD (gastroesophageal reflux disease) 11/16/2017  . Liver cirrhosis ([place]) 11/15/2017  . DNR (do not resuscitate)   . Palliative care by specialist   . Hyponatremia 11/04/2017  . SBP (spontaneous bacterial peritonitis) ([place]) 10/30/2017  . Liver disease, chronic 10/30/2017  . SOB (shortness of breath)   . Abdominal pain 10/28/2017  . Upper airway cough syndrome with flattening on f/v loop 10/13/17 c/w vcd 10/17/2017  . Elevated diaphragm 10/13/2017  . Ileus ([place]) 09/29/2017  . Malnutrition of moderate degree 09/29/2017  . Sinus congestion 09/03/2017  . Symptomatic anemia 09/02/2017  . Other cirrhosis of liver ([place]) 09/02/2017  . Left bundle branch block 09/02/2017  . Mitral stenosis 09/02/2017  . Hematochezia 07/15/2017  . Wide-complex tachycardia ([place])   . Endotracheally intubated   . ESRD on dialysis ([place]) 07/04/2017  . Acute respiratory failure with hypoxia ([place]) 06/18/2017  . CKD (chronic kidney disease) stage V requiring chronic dialysis ([place]) 06/18/2017  . History of Cocaine abuse ([place]) 06/18/2017  . Hypertension 06/18/2017   . Infection of AV graft for dialysis ([place]) 06/18/2017  . Anxiety 06/18/2017  . Anemia due to chronic kidney disease 06/18/2017  . Atrial flutter with rapid ventricular response ([place]) 06/18/2017  . Personality disorder ([place]) 06/13/2017  . Cellulitis 06/12/2017  . Adjustment disorder with mixed anxiety and depressed mood 06/10/2017  . Suicidal ideation 06/10/2017  . Arm wound, left, sequela 06/10/2017  . Dyspnea on exertion 05/29/2017  . Tachycardia 05/29/2017  . Hyperkalemia 05/22/2017  . Acute metabolic encephalopathy   . Anemia 04/23/2017  . Ascites 04/23/2017  . COPD (chronic obstructive pulmonary disease) ([place]) 04/23/2017  . Acute on chronic respiratory failure with hypoxia ([place]) 03/25/2017  . Arrhythmia 03/25/2017  . COPD GOLD 0 with flattening on inps f/v  09/27/2016  . Essential hypertension 09/27/2016  . Fluid overload 08/30/2016  . COPD exacerbation ([place]) 08/17/2016  . Hypertensive urgency 08/17/2016  . Respiratory failure ([place]) 08/17/2016  . Problem with dialysis access ([place]) 07/23/2016  . Chronic hepatitis B ([place]) 03/05/2014  . Chronic hepatitis C without hepatic coma ([place]) 03/05/2014  . Internal hemorrhoids with bleeding, swelling and itching 03/05/2014  . Thrombocytopenia ([place]) 03/05/2014  . Chest pain 02/27/2014  . Alcohol abuse 04/14/2009  . Cigarette smoker 04/14/2009  . GANGLION CYST 04/14/2009    Past Medical History: Past Medical History:  Diagnosis Date  .  Anemia   . Anxiety   . Arthritis    left shoulder  . Atherosclerosis of aorta ([place])   . Cardiomegaly   . Chest pain    DATE UNKNOWN, C/O PERIODICALLY  . Cocaine abuse ([place])   . COPD exacerbation ([place]) 08/17/2016  . Coronary artery disease    stent 02/22/17  . ESRD (end stage renal disease) on dialysis ([place])    "[name]; MWF" (07/04/2017)  . GERD (gastroesophageal reflux disease)    DATE UNKNOWN  . Hemorrhoids   . Hepatitis B, chronic ([place])   . Hepatitis C   . History of kidney stones    . Hyperkalemia   . Hypertension   . Kidney failure   . Metabolic bone disease    Patient denies  . Mitral stenosis   . Myocardial infarction ([place])   . Pneumonia   . Pulmonary edema   . Solitary rectal ulcer syndrome 07/2017   at flex sig for rectal bleeding  . Tubular adenoma of colon     Past Surgical History: Past Surgical History:  Procedure Laterality Date  . A/V FISTULAGRAM Left 05/26/2017   Procedure: A/V FISTULAGRAM;  Surgeon: Conrad Green Bank, MD;  Location: [place] [facility];  Service: Cardiovascular;  Laterality: Left;  . A/V FISTULAGRAM Right 11/18/2017   Procedure: A/V FISTULAGRAM - Right Arm;  Surgeon: Elam Dutch, MD;  Location: [place] [facility];  Service: Cardiovascular;  Laterality: Right;  . APPLICATION OF WOUND VAC Left 06/14/2017   Procedure: APPLICATION OF WOUND VAC;  Surgeon: Katha Cabal, MD;  Location: [facility];  Service: Vascular;  Laterality: Left;  . AV FISTULA PLACEMENT  2012   BELIEVED WAS PLACED IN JUNE  . AV FISTULA PLACEMENT Right 08/09/2017   Procedure: Creation Right arm ARTERIOVENOUS BRACHIOCEPOHALIC FISTULA;  Surgeon: Elam Dutch, MD;  Location: [facility] OR;  Service: Vascular;  Laterality: Right;  . AV FISTULA PLACEMENT Right 11/22/2017   Procedure: INSERTION OF ARTERIOVENOUS (AV) GORE-TEX GRAFT RIGHT UPPER ARM;  Surgeon: Elam Dutch, MD;  Location: [place];  Service: Vascular;  Laterality: Right;  . BIOPSY  01/25/2018   Procedure: BIOPSY;  Surgeon: Jerene Bears, MD;  Location: [facility];  Service: Gastroenterology;;  . COLONOSCOPY    . COLONOSCOPY WITH PROPOFOL N/A 01/25/2018   Procedure: COLONOSCOPY WITH PROPOFOL;  Surgeon: Jerene Bears, MD;  Location: [place];  Service: Gastroenterology;  Laterality: N/A;  . CORONARY STENT INTERVENTION N/A 02/22/2017   Procedure: CORONARY STENT INTERVENTION;  Surgeon: Nigel Mormon, MD;  Location: [place] [facility];  Service: Cardiovascular;  Laterality: N/A;  .  ESOPHAGOGASTRODUODENOSCOPY (EGD) WITH PROPOFOL N/A 01/25/2018   Procedure: ESOPHAGOGASTRODUODENOSCOPY (EGD) WITH PROPOFOL;  Surgeon: Jerene Bears, MD;  Location: [place];  Service: Gastroenterology;  Laterality: N/A;  . FLEXIBLE SIGMOIDOSCOPY N/A 07/15/2017   Procedure: FLEXIBLE SIGMOIDOSCOPY;  Surgeon: Carol Ada, MD;  Location: [place];  Service: Endoscopy;  Laterality: N/A;  . HEMORRHOID BANDING    . I&D EXTREMITY Left 06/01/2017   Procedure: IRRIGATION AND DEBRIDEMENT LEFT ARM HEMATOMA WITH LIGATION OF LEFT ARM AV FISTULA;  Surgeon: Elam Dutch, MD;  Location: [place];  Service: Vascular;  Laterality: Left;  . I&D EXTREMITY Left 06/14/2017   Procedure: IRRIGATION AND DEBRIDEMENT EXTREMITY;  Surgeon: Katha Cabal, MD;  Location: [facility]S;  Service: Vascular;  Laterality: Left;  . INSERTION OF DIALYSIS CATHETER  05/30/2017  . INSERTION OF DIALYSIS CATHETER N/A 05/30/2017   Procedure: INSERTION OF  DIALYSIS CATHETER;  Surgeon: Elam Dutch, MD;  Location: [place];  Service: Vascular;  Laterality: N/A;  . IR PARACENTESIS  08/30/2017  . IR PARACENTESIS  09/29/2017  . IR PARACENTESIS  10/28/2017  . IR PARACENTESIS  11/09/2017  . IR PARACENTESIS  11/16/2017  . IR PARACENTESIS  11/28/2017  . IR PARACENTESIS  12/01/2017  . IR PARACENTESIS  12/06/2017  . IR PARACENTESIS  01/03/2018  . IR PARACENTESIS  01/23/2018  . IR PARACENTESIS  02/07/2018  . IR PARACENTESIS  02/21/2018  . IR PARACENTESIS  03/06/2018  . IR PARACENTESIS  03/17/2018  . IR PARACENTESIS  04/04/2018  . IR PARACENTESIS  12/28/2018  . IR PARACENTESIS  01/08/2019  . IR PARACENTESIS  01/23/2019  . IR PARACENTESIS  02/01/2019  . IR PARACENTESIS  02/19/2019  . IR PARACENTESIS  03/01/2019  . IR RADIOLOGIST EVAL & MGMT  02/14/2018  . IR RADIOLOGIST EVAL & MGMT  02/22/2019  . LEFT HEART CATH AND CORONARY ANGIOGRAPHY N/A 02/22/2017   Procedure: LEFT HEART CATH AND CORONARY ANGIOGRAPHY;  Surgeon: Nigel Mormon, MD;  Location: [place] [facility];  Service: Cardiovascular;  Laterality: N/A;  . LIGATION OF ARTERIOVENOUS  FISTULA Left 0/03/6044   Procedure: Plication of Left Arm Arteriovenous Fistula;  Surgeon: Elam Dutch, MD;  Location: [place];  Service: Vascular;  Laterality: Left;  . POLYPECTOMY    . POLYPECTOMY  01/25/2018   Procedure: POLYPECTOMY;  Surgeon: Jerene Bears, MD;  Location: [place];  Service: Gastroenterology;;  . REVISON OF ARTERIOVENOUS FISTULA Left 10/18/8117   Procedure: PLICATION OF DISTAL ANEURYSMAL SEGEMENT OF LEFT UPPER ARM ARTERIOVENOUS FISTULA;  Surgeon: Elam Dutch, MD;  Location: [place];  Service: Vascular;  Laterality: Left;  . REVISON OF ARTERIOVENOUS FISTULA Left 1/47/8295   Procedure: Plication of Left Upper Arm Fistula ;  Surgeon: Waynetta Sandy, MD;  Location: [place];  Service: Vascular;  Laterality: Left;  . SKIN GRAFT SPLIT THICKNESS LEG / FOOT Left    SKIN GRAFT SPLIT THICKNESS LEFT ARM DONOR SITE: LEFT ANTERIOR THIGH  . SKIN SPLIT GRAFT Left 07/04/2017   Procedure: SKIN GRAFT SPLIT THICKNESS LEFT ARM DONOR SITE: LEFT ANTERIOR THIGH;  Surgeon: Elam Dutch, MD;  Location: [place];  Service: Vascular;  Laterality: Left;  . THROMBECTOMY W/ EMBOLECTOMY Left 06/05/2017   Procedure: EXPLORATION OF LEFT ARM FOR BLEEDING; OVERSEWED PROXIMAL FISTULA;  Surgeon: Angelia Mould, MD;  Location: [place];  Service: Vascular;  Laterality: Left;  . WOUND EXPLORATION Left 06/03/2017   Procedure: WOUND EXPLORATION WITH WOUND VAC APPLICATION TO LEFT ARM;  Surgeon: Angelia Mould, MD;  [facility];  Service: Vascular;  Laterality: Left;    Social History: Social History   Tobacco Use  . Smoking status: Current Every Day Smoker    Packs/day: 0.50    Years: 43.00    Pack years: 21.50    Types: Cigarettes    Start date: 08/13/1973  . Smokeless tobacco: Never Used  Substance Use Topics  . Alcohol use: Not Currently    Frequency: Never    Comment: quit  drinking in 2017  . Drug use: Yes    Types: Marijuana, Cocaine    Comment: reports using once every 3 months   Additional social history:  Please also refer to relevant sections of EMR.  Family History: Family History  Problem Relation Age of Onset  . Heart disease Mother   . Lung cancer Mother   . Heart  disease Father   . Malignant hyperthermia Father   . COPD Father   . Throat cancer Sister   . Esophageal cancer Sister   . Hypertension Other   . COPD Other   . Colon cancer Neg Hx   . Colon polyps Neg Hx   . Rectal cancer Neg Hx   . Stomach cancer Neg Hx     Allergies and Medications: Allergies  Allergen Reactions  . Morphine And Related Other (See Comments)    Stomach pain  . Aspirin Other (See Comments)    STOMACH PAIN  . Clonidine Derivatives Itching  . Tramadol Itching  . Tylenol [Acetaminophen] Nausea Only    Stomach ache   Current Facility-Administered Medications on File Prior to Encounter  Medication Dose Route Frequency Provider Last Rate Last Dose  . albumin human 25 % solution 50 g  50 g Intravenous UD Pyrtle, Lajuan Lines, MD       Current Outpatient Medications on File Prior to Encounter  Medication Sig Dispense Refill  . albuterol (VENTOLIN HFA) 108 (90 Base) MCG/ACT inhaler Inhale 2 puffs into the lungs every 6 (six) hours as needed for wheezing or shortness of breath.    . hydrALAZINE (APRESOLINE) 100 MG tablet Take 100 mg by mouth daily.    . hydrOXYzine (ATARAX/VISTARIL) 25 MG tablet Take 25 mg by mouth 2 (two) times daily as needed.    . lactulose (CHRONULAC) 10 GM/15ML solution TAKE 45 MLS BY MOUTH 2 TIMES DAILY. (Patient taking differently: Take 45 g by mouth 2 (two) times daily. ) 2700 mL 0  . metoprolol tartrate (LOPRESSOR) 25 MG tablet Take 1 tablet (25 mg total) by mouth 2 (two) times daily. 60 tablet 0  . montelukast (SINGULAIR) 10 MG tablet Take 10 mg by mouth daily.    . ondansetron (ZOFRAN) 8 MG tablet Take 8 mg by mouth every 8 (eight) hours  as needed for nausea or vomiting.     . pantoprazole (PROTONIX) 40 MG tablet Take 1 tablet (40 mg total) by mouth See admin instructions. Take one tablet (40 mg) by mouth in the morning on Sunday, Tuesday, Thursday, Saturday, take one tablet (40 mg) after dialysis on Monday, Wednesday, Friday. May also take one tablet (40 mg) in the evening as needed for acid reflux. 30 tablet 11  . sevelamer carbonate (RENVELA) 800 MG tablet Take 4 tablets with meals  And 2 tablets twice daily with snacks (Patient taking differently: Take 1,600-3,200 mg by mouth See admin instructions. Take 3200 mg three time a day with meals, and 1600 mg twice daily with snacks)    . sulfamethoxazole-trimethoprim (BACTRIM DS) 800-160 MG tablet Take 1 tablet by mouth daily. 30 tablet 11  . SYMBICORT 160-4.5 MCG/ACT inhaler Inhale 2 puffs into the lungs 2 (two) times a day.    . zolpidem (AMBIEN) 5 MG tablet Take 5 mg by mouth at bedtime.      Objective: BP (!) 140/93   Pulse 92   Temp (!) 97.5 F (36.4 C) (Oral)   Resp 18   Ht 5\' 10"  (1.778 m)   Wt 63.5 kg   SpO2 100%   BMI 20.09 kg/m  Physical Exam  Constitutional: No distress.  Sitting in bed resting comfortably  HENT:  Head: Normocephalic and atraumatic.  Eyes: EOM are normal.  Cardiovascular: Normal rate, regular rhythm and normal heart sounds. Exam reveals no gallop and no friction rub.  No murmur heard. Pulmonary/Chest: Breath sounds normal. No respiratory distress. He has  no wheezes. He has no rales.  Abdominal: Bowel sounds are normal. He exhibits distension.  Mildly tympanic  Musculoskeletal:        General: No edema.  Neurological: He is alert.  Skin: Skin is warm and dry. He is not diaphoretic.     Labs and Imaging: CBC BMET  Recent Labs  Lab 03/08/19 2200  WBC 9.0  HGB 11.3*  HCT 31.9*  PLT 171   Recent Labs  Lab 03/08/19 2200  NA 130*  K 5.9*  CL 89*  CO2 23  BUN 47*  CREATININE 9.80*  GLUCOSE 95  CALCIUM 9.0     EKG-sinus  rhythm with incomplete left bundle branch block and peaked T waves Phosphorus-1.05 PT-16.0 INR-1.3 Coronavirus-negative  Ct Abdomen Pelvis W Contrast  Result Date: 03/08/2019 CLINICAL DATA:  Abdominal pain, nausea, vomiting EXAM: CT ABDOMEN AND PELVIS WITH CONTRAST TECHNIQUE: Multidetector CT imaging of the abdomen and pelvis was performed using the standard protocol following bolus administration of intravenous contrast. CONTRAST:  172mL OMNIPAQUE IOHEXOL 300 MG/ML  SOLN COMPARISON:  CT 12/17/2018 FINDINGS: Lower chest: Cardiomegaly. Small pericardial effusion. Calcifications throughout the visualized right coronary artery. Lung bases clear. Hepatobiliary: Changes of cirrhosis. No focal hepatic abnormality. Gallbladder grossly unremarkable. Pancreas: No focal abnormality or ductal dilatation. Spleen: No focal abnormality.  Normal size. Adrenals/Urinary Tract: Severely atrophic kidneys, likely related to end-stage renal disease. Extensive renovascular calcifications. No hydronephrosis. Adrenal glands and urinary bladder unremarkable. Stomach/Bowel: Stomach, large and small bowel grossly unremarkable. Clip again noted within the rectum. Vascular/Lymphatic: No evidence of aneurysm or adenopathy. Diffuse aortic and branch vessel atherosclerosis. Reproductive: No visible focal abnormality. Other: Marked large volume ascites throughout the abdomen and pelvis, stable. Musculoskeletal: No acute bony abnormality. Degenerative disc disease at L5-S1. IMPRESSION: Cirrhosis with large volume ascites again noted, stable since prior study. Severely atrophic kidneys bilaterally with extensive renovascular calcifications. Aortic and branch vessel atherosclerosis. Cardiomegaly, small pericardial effusion. Electronically Signed   By: Rolm Baptise M.D.   On: 03/08/2019 19:49   US Paracentesis  Result Date: 02/11/2019 INDICATION: Patient with history of alcoholic cirrhosis with recurrent ascites presented to [facility] [place]  today with complaints of abdominal pain. Request for diagnostic and therapeutic paracentesis today in IR. EXAM: ULTRASOUND GUIDED diagnostic and therapeutic PARACENTESIS MEDICATIONS: 15 mL 1% lidocaine COMPLICATIONS: None immediate. PROCEDURE: Informed written consent was obtained from the patient after a discussion of the risks, benefits and alternatives to treatment. A timeout was performed prior to the initiation of the procedure. Initial ultrasound scanning demonstrates a large amount of ascites within the left lower abdominal quadrant. The left lower abdomen was prepped and draped in the usual sterile fashion. 1% lidocaine was used for local anesthesia. Following this, a 19 gauge, 7-cm, Yueh catheter was introduced. An ultrasound image was saved for documentation purposes. The paracentesis was performed. The catheter was removed and a dressing was applied. The patient tolerated the procedure well without immediate post procedural complication. FINDINGS: A total of approximately 2.7 L of amber fluid was removed. Samples were sent to the laboratory as requested by the clinical team. IMPRESSION: Successful ultrasound-guided paracentesis yielding 2.7 liters of peritoneal fluid. Read by Candiss Norse, PA-C Electronically Signed   By: Lucrezia Europe M.D.   On: 02/11/2019 15:55   Dg Chest Portable 1 View  Result Date: 02/09/2019 CLINICAL DATA:  Shortness of breath EXAM: PORTABLE CHEST 1 VIEW COMPARISON:  02/01/2019 FINDINGS: Cardiomegaly. Chronic diffuse interstitial opacity. No effusion or pneumothorax. Atherosclerosis. IMPRESSION: Cardiomegaly and vascular  congestion that is stable compared to multiple priors. Electronically Signed   By: Monte Fantasia M.D.   On: 02/09/2019 04:56   Ir Radiologist Eval & Mgmt  Result Date: 02/22/2019 Please refer to notes tab for details about interventional procedure. (Op Note)  Ir Paracentesis  Result Date: 03/01/2019 INDICATION: Recurrent ascites EXAM:  ULTRASOUND-GUIDED PARACENTESIS COMPARISON:  Previous paracentesis. MEDICATIONS: 10 cc 1% lidocaine COMPLICATIONS: None immediate. TECHNIQUE: Informed written consent was obtained from the patient after a discussion of the risks, benefits and alternatives to treatment. A timeout was performed prior to the initiation of the procedure. Initial ultrasound scanning demonstrates a large amount of ascites within the right lower abdominal quadrant. The right lower abdomen was prepped and draped in the usual sterile fashion. 1% lidocaine with epinephrine was used for local anesthesia. Under direct ultrasound guidance, a 19 gauge, 7-cm, Yueh catheter was introduced. An ultrasound image was saved for documentation purposed. The paracentesis was performed. The catheter was removed and a dressing was applied. The patient tolerated the procedure well without immediate post procedural complication. FINDINGS: A total of approximately 4 liters of light amber fluid was removed. IMPRESSION: Successful ultrasound-guided paracentesis yielding 4 liters of peritoneal fluid. Read by Lavonia Drafts [facility] Electronically Signed   By: Markus Daft M.D.   On: 03/01/2019 14:22   Ir Paracentesis  Result Date: 02/19/2019 INDICATION: Ascites secondary to alcoholic cirrhosis. Request for diagnostic and therapeutic paracentesis. EXAM: ULTRASOUND GUIDED PARACENTESIS MEDICATIONS: % lidocaine 15 mL COMPLICATIONS: None immediate. PROCEDURE: Informed written consent was obtained from the patient after a discussion of the risks, benefits and alternatives to treatment. A timeout was performed prior to the initiation of the procedure. Initial ultrasound scanning demonstrates a moderate amount of ascites within the left lower abdominal quadrant. The left lower abdomen was prepped and draped in the usual sterile fashion. 1% lidocaine with epinephrine was used for local anesthesia. Following this, a 19 gauge, 7-cm, Yueh catheter was introduced. An ultrasound  image was saved for documentation purposes. The paracentesis was performed. The catheter was removed and a dressing was applied. The patient tolerated the procedure well without immediate post procedural complication. FINDINGS: A total of approximately 2.1 L of clear yellow fluid was removed. Samples were sent to the laboratory as requested by the clinical team. IMPRESSION: Successful ultrasound-guided paracentesis yielding 2.1 liters of peritoneal fluid. Read by: Gareth Eagle, PA-C Electronically Signed   By: Markus Daft M.D.   On: 02/19/2019 15:38     Gifford Shave, MD 03/08/2019, 11:43 PM PGY-1, [place] Intern pager: [phone], text pages welcome  [place]   I have seen and examined this patient.    I have discussed the findings and exam with the intern and agree with the above note, which I have edited appropriately in [place]. I helped develop the management plan that is described in the resident's note, and I agree with the content.   Marny Lowenstein, MD, MS FAMILY MEDICINE RESIDENT - PGY3 03/08/2019 11:48 PM

## 2019-03-08 NOTE — Consult Note (Addendum)
[facility] Renal Consultation Note    Indication for Consultation:  Management of ESRD/hemodialysis; anemia, hypertension/volume and secondary hyperparathyroidism  HPI: [name] is a 58 y.o. male.  Mr. 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  He reports worsening abdominal distension and has frequent paracenteses (last one 03/01/19 of 4 liters).  While in the ED, his labs were significant for K of 6.6, Na 131, BUN 45, Ca 9, normal LFT's, albumin 3.7.  We were consulted to provide dialysis for his hyperkalemia.  He did not go to HD on 03/05/19 because he said he was "sick" and his "stomach hurts".  He has not run his full time for the previous 4 treatments and has a history on noncompliance with HD.  When told of the plan to admit for further workup and for HD tonight due to his dangerously high potassium he refused saying "its too late" and "I wont go until I get something to eat".  I discussed the risks of refusing dialysis including cardiac arythmia and death.  Past Medical History:  Diagnosis Date  . Anemia   . Anxiety   . Arthritis    left shoulder  . Atherosclerosis of aorta ([place])   . Cardiomegaly   . Chest pain    DATE UNKNOWN, C/O PERIODICALLY  . Cocaine abuse ([place])   . COPD exacerbation ([place]) 08/17/2016  . Coronary artery disease    stent 02/22/17  . ESRD (end stage renal disease) on dialysis ([place])    "[name]; MWF" (07/04/2017)  . GERD (gastroesophageal reflux disease)    DATE UNKNOWN  . Hemorrhoids   . Hepatitis B, chronic ([place])   . Hepatitis C   . History of kidney stones   . Hyperkalemia   . Hypertension   . Kidney failure   . Metabolic bone disease    Patient denies  . Mitral stenosis   . Myocardial infarction ([place])   . Pneumonia   . Pulmonary edema   .  Solitary rectal ulcer syndrome 07/2017   at flex sig for rectal bleeding  . Tubular adenoma of colon    Past Surgical History:  Procedure Laterality Date  . A/V FISTULAGRAM Left 05/26/2017   Procedure: A/V FISTULAGRAM;  Surgeon: Conrad Winona, MD;  Location: [place] [facility];  Service: Cardiovascular;  Laterality: Left;  . A/V FISTULAGRAM Right 11/18/2017   Procedure: A/V FISTULAGRAM - Right Arm;  Surgeon: Elam Dutch, MD;  Location: [place] [facility];  Service: Cardiovascular;  Laterality: Right;  . APPLICATION OF WOUND VAC Left 06/14/2017   Procedure: APPLICATION OF WOUND VAC;  Surgeon: Katha Cabal, MD;  Location: [facility] ORS;  Service: Vascular;  Laterality: Left;  . AV FISTULA PLACEMENT  2012   BELIEVED WAS PLACED IN JUNE  . AV FISTULA PLACEMENT Right 08/09/2017   Procedure: Creation Right arm ARTERIOVENOUS BRACHIOCEPOHALIC FISTULA;  Surgeon: Elam Dutch, MD;  Location: [facility] OR;  Service: Vascular;  Laterality: Right;  . AV FISTULA PLACEMENT Right 11/22/2017   Procedure: INSERTION OF ARTERIOVENOUS (AV) GORE-TEX GRAFT RIGHT UPPER ARM;  Surgeon: Elam Dutch, MD;  Location: [place];  Service: Vascular;  Laterality: Right;  . BIOPSY  01/25/2018   Procedure: BIOPSY;  Surgeon: Jerene Bears, MD;  Location: [facility] ENDOSCOPY;  Service: Gastroenterology;;  . COLONOSCOPY    .  COLONOSCOPY WITH PROPOFOL N/A 01/25/2018   Procedure: COLONOSCOPY WITH PROPOFOL;  Surgeon: Jerene Bears, MD;  Location: [place];  Service: Gastroenterology;  Laterality: N/A;  . CORONARY STENT INTERVENTION N/A 02/22/2017   Procedure: CORONARY STENT INTERVENTION;  Surgeon: Nigel Mormon, MD;  Location: [place] [facility];  Service: Cardiovascular;  Laterality: N/A;  . ESOPHAGOGASTRODUODENOSCOPY (EGD) WITH PROPOFOL N/A 01/25/2018   Procedure: ESOPHAGOGASTRODUODENOSCOPY (EGD) WITH PROPOFOL;  Surgeon: Jerene Bears, MD;  Location: [place];  Service: Gastroenterology;  Laterality: N/A;  . FLEXIBLE  SIGMOIDOSCOPY N/A 07/15/2017   Procedure: FLEXIBLE SIGMOIDOSCOPY;  Surgeon: Carol Ada, MD;  Location: [place];  Service: Endoscopy;  Laterality: N/A;  . HEMORRHOID BANDING    . I&D EXTREMITY Left 06/01/2017   Procedure: IRRIGATION AND DEBRIDEMENT LEFT ARM HEMATOMA WITH LIGATION OF LEFT ARM AV FISTULA;  Surgeon: Elam Dutch, MD;  Location: [place];  Service: Vascular;  Laterality: Left;  . I&D EXTREMITY Left 06/14/2017   Procedure: IRRIGATION AND DEBRIDEMENT EXTREMITY;  Surgeon: Katha Cabal, MD;  Location: [facility] ORS;  Service: Vascular;  Laterality: Left;  . INSERTION OF DIALYSIS CATHETER  05/30/2017  . INSERTION OF DIALYSIS CATHETER N/A 05/30/2017   Procedure: INSERTION OF DIALYSIS CATHETER;  Surgeon: Elam Dutch, MD;  Location: [place];  Service: Vascular;  Laterality: N/A;  . IR PARACENTESIS  08/30/2017  . IR PARACENTESIS  09/29/2017  . IR PARACENTESIS  10/28/2017  . IR PARACENTESIS  11/09/2017  . IR PARACENTESIS  11/16/2017  . IR PARACENTESIS  11/28/2017  . IR PARACENTESIS  12/01/2017  . IR PARACENTESIS  12/06/2017  . IR PARACENTESIS  01/03/2018  . IR PARACENTESIS  01/23/2018  . IR PARACENTESIS  02/07/2018  . IR PARACENTESIS  02/21/2018  . IR PARACENTESIS  03/06/2018  . IR PARACENTESIS  03/17/2018  . IR PARACENTESIS  04/04/2018  . IR PARACENTESIS  12/28/2018  . IR PARACENTESIS  01/08/2019  . IR PARACENTESIS  01/23/2019  . IR PARACENTESIS  02/01/2019  . IR PARACENTESIS  02/19/2019  . IR PARACENTESIS  03/01/2019  . IR RADIOLOGIST EVAL & MGMT  02/14/2018  . IR RADIOLOGIST EVAL & MGMT  02/22/2019  . LEFT HEART CATH AND CORONARY ANGIOGRAPHY N/A 02/22/2017   Procedure: LEFT HEART CATH AND CORONARY ANGIOGRAPHY;  Surgeon: Nigel Mormon, MD;  Location: [place] [facility];  Service: Cardiovascular;  Laterality: N/A;  . LIGATION OF ARTERIOVENOUS  FISTULA Left 03/13/1193   Procedure: Plication of Left Arm Arteriovenous Fistula;  Surgeon: Elam Dutch, MD;  Location: [place];  Service:  Vascular;  Laterality: Left;  . POLYPECTOMY    . POLYPECTOMY  01/25/2018   Procedure: POLYPECTOMY;  Surgeon: Jerene Bears, MD;  Location: [place];  Service: Gastroenterology;;  . REVISON OF ARTERIOVENOUS FISTULA Left 1/74/0814   Procedure: PLICATION OF DISTAL ANEURYSMAL SEGEMENT OF LEFT UPPER ARM ARTERIOVENOUS FISTULA;  Surgeon: Elam Dutch, MD;  Location: [place];  Service: Vascular;  Laterality: Left;  . REVISON OF ARTERIOVENOUS FISTULA Left 4/81/8563   Procedure: Plication of Left Upper Arm Fistula ;  Surgeon: Waynetta Sandy, MD;  Location: [place];  Service: Vascular;  Laterality: Left;  . SKIN GRAFT SPLIT THICKNESS LEG / FOOT Left    SKIN GRAFT SPLIT THICKNESS LEFT ARM DONOR SITE: LEFT ANTERIOR THIGH  . SKIN SPLIT GRAFT Left 07/04/2017   Procedure: SKIN GRAFT SPLIT THICKNESS LEFT ARM DONOR SITE: LEFT ANTERIOR THIGH;  Surgeon: Elam Dutch, MD;  Location: [place];  Service:  Vascular;  Laterality: Left;  . THROMBECTOMY W/ EMBOLECTOMY Left 06/05/2017   Procedure: EXPLORATION OF LEFT ARM FOR BLEEDING; OVERSEWED PROXIMAL FISTULA;  Surgeon: Angelia Mould, MD;  Location: [place];  Service: Vascular;  Laterality: Left;  . WOUND EXPLORATION Left 06/03/2017   Procedure: WOUND EXPLORATION WITH WOUND VAC APPLICATION TO LEFT ARM;  Surgeon: Angelia Mould, MD;  Location: [facility] OR;  Service: Vascular;  Laterality: Left;   Family History:   Family History  Problem Relation Age of Onset  . Heart disease Mother   . Lung cancer Mother   . Heart disease Father   . Malignant hyperthermia Father   . COPD Father   . Throat cancer Sister   . Esophageal cancer Sister   . Hypertension Other   . COPD Other   . Colon cancer Neg Hx   . Colon polyps Neg Hx   . Rectal cancer Neg Hx   . Stomach cancer Neg Hx    Social History:  reports that he has been smoking cigarettes. He started smoking about 45 years ago. He has a 21.50 pack-year smoking history. He has never used  smokeless tobacco. He reports previous alcohol use. He reports current drug use. Drugs: Marijuana and Cocaine. Allergies  Allergen Reactions  . Morphine And Related Other (See Comments)    Stomach pain  . Aspirin Other (See Comments)    STOMACH PAIN  . Clonidine Derivatives Itching  . Tramadol Itching  . Tylenol [Acetaminophen] Nausea Only    Stomach ache   Prior to Admission medications   Medication Sig Start Date End Date Taking? Authorizing Provider  albuterol (VENTOLIN HFA) 108 (90 Base) MCG/ACT inhaler Inhale 2 puffs into the lungs every 6 (six) hours as needed for wheezing or shortness of breath.   Yes [provider]  hydrALAZINE (APRESOLINE) 100 MG tablet Take 100 mg by mouth daily. 02/13/19  Yes [provider]  hydrOXYzine (ATARAX/VISTARIL) 25 MG tablet Take 25 mg by mouth 2 (two) times daily as needed. 02/14/19  Yes [provider]  lactulose (CHRONULAC) 10 GM/15ML solution TAKE 45 MLS BY MOUTH 2 TIMES DAILY. Patient taking differently: Take 45 g by mouth 2 (two) times daily.  01/30/19  Yes Pyrtle, Lajuan Lines, MD  metoprolol tartrate (LOPRESSOR) 25 MG tablet Take 1 tablet (25 mg total) by mouth 2 (two) times daily. 03/11/18  Yes Vann, Jessica U, DO  montelukast (SINGULAIR) 10 MG tablet Take 10 mg by mouth daily.   Yes [provider]  ondansetron (ZOFRAN) 8 MG tablet Take 8 mg by mouth every 8 (eight) hours as needed for nausea or vomiting.    Yes [provider]  pantoprazole (PROTONIX) 40 MG tablet Take 1 tablet (40 mg total) by mouth See admin instructions. Take one tablet (40 mg) by mouth in the morning on Sunday, Tuesday, Thursday, Saturday, take one tablet (40 mg) after dialysis on Monday, Wednesday, Friday. May also take one tablet (40 mg) in the evening as needed for acid reflux. 12/26/18  Yes Esterwood, Amy S, PA-C  sevelamer carbonate (RENVELA) 800 MG tablet Take 4 tablets with meals  And 2 tablets twice daily with snacks Patient taking  differently: Take 1,600-3,200 mg by mouth See admin instructions. Take 3200 mg three time a day with meals, and 1600 mg twice daily with snacks 01/22/18  Yes Patrecia Pour, MD  sulfamethoxazole-trimethoprim (BACTRIM DS) 800-160 MG tablet Take 1 tablet by mouth daily. 12/30/18  Yes Geradine Girt, DO  SYMBICORT  160-4.5 MCG/ACT inhaler Inhale 2 puffs into the lungs 2 (two) times a day. 11/30/18  Yes [provider]  zolpidem (AMBIEN) 5 MG tablet Take 5 mg by mouth at bedtime. 12/08/18  Yes [provider]   Current Facility-Administered Medications  Medication Dose Route Frequency Provider Last Rate Last Dose  . albumin human 25 % solution 50 g  50 g Intravenous UD Pyrtle, Lajuan Lines, MD      . Derrill Memo ON 03/09/2019] Chlorhexidine Gluconate Cloth 2 % PADS 6 each  6 each Topical Q0600 Donato Heinz, MD       Current Outpatient Medications  Medication Sig Dispense Refill  . albuterol (VENTOLIN HFA) 108 (90 Base) MCG/ACT inhaler Inhale 2 puffs into the lungs every 6 (six) hours as needed for wheezing or shortness of breath.    . hydrALAZINE (APRESOLINE) 100 MG tablet Take 100 mg by mouth daily.    . hydrOXYzine (ATARAX/VISTARIL) 25 MG tablet Take 25 mg by mouth 2 (two) times daily as needed.    . lactulose (CHRONULAC) 10 GM/15ML solution TAKE 45 MLS BY MOUTH 2 TIMES DAILY. (Patient taking differently: Take 45 g by mouth 2 (two) times daily. ) 2700 mL 0  . metoprolol tartrate (LOPRESSOR) 25 MG tablet Take 1 tablet (25 mg total) by mouth 2 (two) times daily. 60 tablet 0  . montelukast (SINGULAIR) 10 MG tablet Take 10 mg by mouth daily.    . ondansetron (ZOFRAN) 8 MG tablet Take 8 mg by mouth every 8 (eight) hours as needed for nausea or vomiting.     . pantoprazole (PROTONIX) 40 MG tablet Take 1 tablet (40 mg total) by mouth See admin instructions. Take one tablet (40 mg) by mouth in the morning on Sunday, Tuesday, Thursday, Saturday, take one tablet (40 mg) after dialysis on Monday,  Wednesday, Friday. May also take one tablet (40 mg) in the evening as needed for acid reflux. 30 tablet 11  . sevelamer carbonate (RENVELA) 800 MG tablet Take 4 tablets with meals  And 2 tablets twice daily with snacks (Patient taking differently: Take 1,600-3,200 mg by mouth See admin instructions. Take 3200 mg three time a day with meals, and 1600 mg twice daily with snacks)    . sulfamethoxazole-trimethoprim (BACTRIM DS) 800-160 MG tablet Take 1 tablet by mouth daily. 30 tablet 11  . SYMBICORT 160-4.5 MCG/ACT inhaler Inhale 2 puffs into the lungs 2 (two) times a day.    . zolpidem (AMBIEN) 5 MG tablet Take 5 mg by mouth at bedtime.     Labs: Basic Metabolic Panel: Recent Labs  Lab 03/08/19 1850 03/08/19 1857  NA 131* 129*  K 6.6* 6.5*  CL 90* 94*  CO2 23  --   GLUCOSE 60* 56*  BUN 45* 42*  CREATININE 9.74* 9.50*  CALCIUM 9.0  --    Liver Function Tests: Recent Labs  Lab 03/08/19 1850  AST 27  ALT 13  ALKPHOS 105  BILITOT 1.2  PROT 6.6  ALBUMIN 3.7   Recent Labs  Lab 03/08/19 1850  LIPASE 21   No results for input(s): AMMONIA in the last 168 hours. CBC: Recent Labs  Lab 03/08/19 1850 03/08/19 1857  WBC 9.6  --   NEUTROABS 6.4  --   HGB 11.9* 12.9*  HCT 33.5* 38.0*  MCV 83.1  --   PLT 160  --    Cardiac Enzymes: No results for input(s): CKTOTAL, CKMB, CKMBINDEX, TROPONINI in the last 168 hours. CBG: No results for input(s):  GLUCAP in the last 168 hours. Iron Studies: No results for input(s): IRON, TIBC, TRANSFERRIN, FERRITIN in the last 72 hours. Studies/Results: Ct Abdomen Pelvis W Contrast  Result Date: 03/08/2019 CLINICAL DATA:  Abdominal pain, nausea, vomiting EXAM: CT ABDOMEN AND PELVIS WITH CONTRAST TECHNIQUE: Multidetector CT imaging of the abdomen and pelvis was performed using the standard protocol following bolus administration of intravenous contrast. CONTRAST:  150mL OMNIPAQUE IOHEXOL 300 MG/ML  SOLN COMPARISON:  CT 12/17/2018 FINDINGS: Lower  chest: Cardiomegaly. Small pericardial effusion. Calcifications throughout the visualized right coronary artery. Lung bases clear. Hepatobiliary: Changes of cirrhosis. No focal hepatic abnormality. Gallbladder grossly unremarkable. Pancreas: No focal abnormality or ductal dilatation. Spleen: No focal abnormality.  Normal size. Adrenals/Urinary Tract: Severely atrophic kidneys, likely related to end-stage renal disease. Extensive renovascular calcifications. No hydronephrosis. Adrenal glands and urinary bladder unremarkable. Stomach/Bowel: Stomach, large and small bowel grossly unremarkable. Clip again noted within the rectum. Vascular/Lymphatic: No evidence of aneurysm or adenopathy. Diffuse aortic and branch vessel atherosclerosis. Reproductive: No visible focal abnormality. Other: Marked large volume ascites throughout the abdomen and pelvis, stable. Musculoskeletal: No acute bony abnormality. Degenerative disc disease at L5-S1. IMPRESSION: Cirrhosis with large volume ascites again noted, stable since prior study. Severely atrophic kidneys bilaterally with extensive renovascular calcifications. Aortic and branch vessel atherosclerosis. Cardiomegaly, small pericardial effusion. Electronically Signed   By: Rolm Baptise M.D.   On: 03/08/2019 19:49    ROS: Pertinent items are noted in HPI. Physical Exam: Vitals:   03/08/19 1517 03/08/19 1734 03/08/19 1807 03/08/19 2011  BP:   94/74   Pulse:  (!) 109 97   Resp:  (!) 25 13   Temp:    (!) 97.5 F (36.4 C)  TempSrc:    Oral  SpO2:  98% 99%   Weight: 63.5 kg     Height: 5\' 10"  (1.778 m)         Weight change:  No intake or output data in the 24 hours ending 03/08/19 2033 BP 94/74 (BP Location: Right Arm)   Pulse 97   Temp (!) 97.5 F (36.4 C) (Oral)   Resp 13   Ht 5\' 10"  (1.778 m)   Wt 63.5 kg   SpO2 99%   BMI 20.09 kg/m  General appearance: no distress and lying in bed and appears irritable Head: Normocephalic, without obvious abnormality,  atraumatic Eyes: negative findings: lids and lashes normal, conjunctivae and sclerae normal and corneas clear Resp: clear to auscultation bilaterally Cardio: no rub GI: +BS, distended, mild tenderness to palpation but no rebound or guarding, + fluid wave Extremities: extremities normal, atraumatic, no cyanosis or edema and RUE AVG +T/B Dialysis Access:  Dialysis Orders: Center: [facility]  on MWF . EDW 68kg HD [place] 2K/2Ca  Time 4 hours Heparin none. Access RUE AVG BFR 400 DFR 800   Assessment/Plan: 1.  Hyperkalemia- due to noncompliance with HD.  He was given calcium gluconate and lokelma 10 gm po.  I discussed the need for HD and asked to nurse to give him a sandwich so he would agree to go tonight.  2.  ESRD -  As above.  Noncompliant and plan for urgent HD due to hyperkalemia.  Hopefully he will agree to go to HD later today after he gets pain meds and food. 3.  Hypertension/volume  -  He is below his edw but has ascites 4. Cirrhosis with recurrent ascites- managed by regular, large volume paracenteses.  He is followed by Dr. Paulita Fujita who is exploring possible TIPS (  was not a candidate for liver transplant due to severe mitral stenosis and elevated pulmonary artery pressure 5. Abdominal Pain- pain meds per primary svc.   6. Anemia  - stable no esa 7.  Metabolic bone disease -  Check phos 8.  Nutrition - renal diet 9. Noncompliance- ongoing issue.  Donetta Potts, MD [place] Pager [phone] 03/08/2019, 8:33 PM

## 2019-03-08 NOTE — ED Triage Notes (Signed)
PT refused care from EMS. Pt reported LLQ pain and nausea. Pt not going to dialysis and has a distended AB.

## 2019-03-09 DIAGNOSIS — R140 Abdominal distension (gaseous): Secondary | ICD-10-CM

## 2019-03-09 DIAGNOSIS — L8990 Pressure ulcer of unspecified site, unspecified stage: Secondary | ICD-10-CM | POA: Insufficient documentation

## 2019-03-09 LAB — BASIC METABOLIC PANEL
Anion gap: 14 (ref 5–15)
BUN: 28 mg/dL — ABNORMAL HIGH (ref 6–20)
CO2: 26 mmol/L (ref 22–32)
Calcium: 8.4 mg/dL — ABNORMAL LOW (ref 8.9–10.3)
Chloride: 94 mmol/L — ABNORMAL LOW (ref 98–111)
Creatinine, Ser: 6.58 mg/dL — ABNORMAL HIGH (ref 0.61–1.24)
GFR calc Af Amer: 10 mL/min — ABNORMAL LOW (ref >60)
GFR calc non Af Amer: 9 mL/min — ABNORMAL LOW (ref >60)
Glucose, Bld: 78 mg/dL (ref 70–99)
Potassium: 4.7 mmol/L (ref 3.5–5.1)
Sodium: 134 mmol/L — ABNORMAL LOW (ref 135–145)

## 2019-03-09 MED ORDER — PANTOPRAZOLE SODIUM 40 MG PO TBEC
40.0000 mg | DELAYED_RELEASE_TABLET | ORAL | Status: DC
  Administered 2019-03-09: 40 mg via ORAL
  Filled 2019-03-09: qty 1

## 2019-03-09 MED ORDER — FENTANYL CITRATE (PF) 100 MCG/2ML IJ SOLN
25.0000 ug | Freq: Once | INTRAMUSCULAR | Status: AC
  Administered 2019-03-09: 25 ug via INTRAVENOUS
  Filled 2019-03-09: qty 2

## 2019-03-09 MED ORDER — SEVELAMER CARBONATE 800 MG PO TABS: 1600.0000 mg | ORAL_TABLET | ORAL | Status: DC | PRN

## 2019-03-09 MED ORDER — ALBUMIN HUMAN 25 % IV SOLN
50.0000 g | Freq: Once | INTRAVENOUS | Status: DC
  Filled 2019-03-09: qty 200

## 2019-03-09 MED ORDER — PANTOPRAZOLE SODIUM 40 MG PO TBEC: 40.0000 mg | DELAYED_RELEASE_TABLET | Freq: Every day | ORAL | Status: DC | PRN

## 2019-03-09 MED ORDER — ACETAMINOPHEN 500 MG PO TABS
500.0000 mg | ORAL_TABLET | Freq: Four times a day (QID) | ORAL | Status: DC | PRN
  Administered 2019-03-10: 500 mg via ORAL
  Filled 2019-03-09: qty 1

## 2019-03-09 MED ORDER — PANTOPRAZOLE SODIUM 40 MG PO TBEC
40.0000 mg | DELAYED_RELEASE_TABLET | ORAL | Status: DC
  Administered 2019-03-10: 40 mg via ORAL
  Filled 2019-03-09: qty 1

## 2019-03-09 MED ORDER — HYDROMORPHONE HCL 1 MG/ML IJ SOLN
1.0000 mg | Freq: Once | INTRAMUSCULAR | Status: AC
  Administered 2019-03-09: 1 mg via INTRAVENOUS
  Filled 2019-03-09: qty 1

## 2019-03-09 MED ORDER — FENTANYL CITRATE 100 MCG BU TABS: 100.0000 ug | ORAL_TABLET | Freq: Once | BUCCAL | Status: DC

## 2019-03-09 MED ORDER — FLUTICASONE FUROATE-VILANTEROL 200-25 MCG/INH IN AEPB
1.0000 | INHALATION_SPRAY | Freq: Every day | RESPIRATORY_TRACT | Status: DC
  Administered 2019-03-09 – 2019-03-10 (×2): 1 via RESPIRATORY_TRACT
  Filled 2019-03-09: qty 28

## 2019-03-09 NOTE — Telephone Encounter (Signed)
Noted. Pt is currently an inpatient.

## 2019-03-09 NOTE — Discharge Summary (Addendum)
[facility] Discharge Summary  Patient name: Jaken Fregia Medical record number: [id number] Date of birth: 1961/01/24 Age: 58 y.o. Gender: male Date of Admission: 03/08/2019  Date of Discharge: 03/10/19 Admitting Physician: Gifford Shave, MD  Primary Care Provider: [name], FNP Consultants: IR  Indication for Hospitalization: abdominal pain 2/2 to ascites/liver cirrhosis and hyperkalemia 2/2 to incomplete HD  Discharge Diagnoses/Problem List:   Active Problems:   Chronic hepatitis B ([place])   Acute metabolic encephalopathy   Hyperkalemia   CKD (chronic kidney disease) stage V requiring chronic dialysis ([place])   ESRD on dialysis ([place])   Other cirrhosis of liver ([place])   Liver cirrhosis ([place])   Pressure injury of skin   Abdominal distention  Disposition: discharge to home   Discharge Condition: stable   Discharge Exam:  Physical Exam  Brief Hospital Course:   Jojo Pehl was admitted with abdominal pain 2/2 to ascites fluid buildup in the setting of end stage liver disease. Patient was treated with IV dilaudid that eased his abdominal pain and received urgent HD on the night of admission. Patient was scheduled for HD the following day but denied this as he reported feeling "dehydrated already". Patient was recently seen in GI clinic and was determined to now be candidate for liver transplant given his pulmonary hypertension and severe mitral stenosis. Patient also poor candidate for TIPS procedure given concern for worsening right heart strain. Patient's outpatient treatment plans were to receive scheduled paracentesis procedures with cell counts. During this admission patient had one paracentesis that helped to relieve his abdominal ascites volume and was deemed safe to discharge home. Patient's initial hyperkalemia was resolved following HD.   Issues for Follow Up:  1. Patient already scheduled for paracentesis for abdominal  ascites. 2. Please monitor electrolyte and hepatic enzyme levels with CMP    Significant Procedures:  HD  Paracentesis  Significant Labs and Imaging:  Recent Labs  Lab 03/08/19 1850 03/08/19 1857 03/08/19 2200  WBC 9.6  --  9.0  HGB 11.9* 12.9* 11.3*  HCT 33.5* 38.0* 31.9*  PLT 160  --  171   Recent Labs  Lab 03/08/19 1850 03/08/19 1857 03/08/19 2200 03/09/19 0529  NA 131* 129* 130* 134*  K 6.6* 6.5* 5.9* 4.7  CL 90* 94* 89* 94*  CO2 23  --  23 26  GLUCOSE 60* 56* 95 78  BUN 45* 42* 47* 28*  CREATININE 9.74* 9.50* 9.80* 6.58*  CALCIUM 9.0  --  9.0 8.4*  PHOS  --   --  5.0*  --   ALKPHOS 105  --   --   --   AST 27  --   --   --   ALT 13  --   --   --   ALBUMIN 3.7  --  3.5  --     Results/Tests Pending at Time of Discharge:   Discharge Medications:  Allergies as of 03/10/2019      Reactions   Morphine And Related Other (See Comments)   Stomach pain   Aspirin Other (See Comments)   STOMACH PAIN   Clonidine Derivatives Itching   Tramadol Itching   Tylenol [acetaminophen] Nausea Only   Stomach ache      Medication List    TAKE these medications   hydrALAZINE 100 MG tablet Commonly known as: APRESOLINE Take 100 mg by mouth daily.   hydrOXYzine 25 MG tablet Commonly known as: ATARAX/VISTARIL Take 25 mg by mouth  2 (two) times daily as needed.   lactulose 10 GM/15ML solution Commonly known as: CHRONULAC TAKE 45 MLS BY MOUTH 2 TIMES DAILY. What changed: See the new instructions.   metoprolol tartrate 25 MG tablet Commonly known as: LOPRESSOR Take 1 tablet (25 mg total) by mouth 2 (two) times daily.   montelukast 10 MG tablet Commonly known as: SINGULAIR Take 10 mg by mouth daily.   ondansetron 8 MG tablet Commonly known as: ZOFRAN Take 8 mg by mouth every 8 (eight) hours as needed for nausea or vomiting.   pantoprazole 40 MG tablet Commonly known as: PROTONIX Take 1 tablet (40 mg total) by mouth See admin instructions. Take one tablet (40 mg)  by mouth in the morning on Sunday, Tuesday, Thursday, Saturday, take one tablet (40 mg) after dialysis on Monday, Wednesday, Friday. May also take one tablet (40 mg) in the evening as needed for acid reflux.   sevelamer carbonate 800 MG tablet Commonly known as: RENVELA Take 4 tablets with meals  And 2 tablets twice daily with snacks What changed:   how much to take  how to take this  when to take this  additional instructions   sulfamethoxazole-trimethoprim 800-160 MG tablet Commonly known as: BACTRIM DS Take 1 tablet by mouth daily.   Symbicort 160-4.5 MCG/ACT inhaler Generic drug: budesonide-formoterol Inhale 2 puffs into the lungs 2 (two) times a day.   Ventolin HFA 108 (90 Base) MCG/ACT inhaler Generic drug: albuterol Inhale 2 puffs into the lungs every 6 (six) hours as needed for wheezing or shortness of breath.   zolpidem 5 MG tablet Commonly known as: AMBIEN Take 5 mg by mouth at bedtime.       Discharge Instructions: Please refer to Patient Instructions section of EMR for full details.  Patient was counseled important signs and symptoms that should prompt return to medical care, changes in medications, dietary instructions, activity restrictions, and follow up appointments.   Follow-Up Appointments:  Patient follows regularly with GI   Please attend appointments as previously scheduled.   Stark Klein, MD 03/13/2019, 7:59 AM PGY-1, [place]

## 2019-03-09 NOTE — Progress Notes (Signed)
Family Medicine Teaching Service Daily Progress Note Intern Pager: [phone]  Patient name: [name] Medical record number: [id number] Date of birth: July 10, 1961 Age: 58 y.o. Gender: male  Primary Care Provider: [name], FNP Consultants:  Code Status: DNR  Pt Overview and Major Events to Date:  8/27: Admitted with abdominal pain and hyperkalemia  Assessment and Plan: 58 year old male p/w abdominal pain and hyperkalemia in the setting of incomplete HD and ascites.   Abdominal Pain 2/2 to ascites & Cirrhosis and Chronic Hep B,C  Patient previously reported that he did not desire to have paracentesis but now states that he would like to have the procedure as soon as possible. IR consulted and paracentesis ordered with administration of albumin. Patient may be able to have procedure done today or 03/10/19 pending on scheduling. Patient reports that he no longer is experiencing abdominal pain. Physical exam notable for fluid wave 2/2 to ascites, non tender with abdominal distention and scleral icterus.  -if continued pain, will give PO dilaudid  -patient denies pain today, states dilaudid helped  -plan for paracentesis with IR, pending scheduling availability  Hyperkalemia  K decreased to 4.7 from 5.9, was 6.5 on admission -will continue to monitor with BMP   ESRD  Patient had HD overnight, according to his normal schedule should have another session today. Patient refusing HD today stating he feels dehydrated from his emergent session yesterday.  -Nephrology consulted  -HD MWF  CAD Patient currently on no medications for coronary artery disease.  Last lipid panel done 08/17/2016. - Consider cardiology consult for medical optimization  HTN Blood pressures have ranged from 97/74-139/101 since admission.  Patient home meds include hydralazine 100 mg daily, metoprolol 25 mg twice a day - Continue home medications while hospitalized  COPD Patient on Symbicort at home  2 puffs twice daily -Breo Ellipta 200-25 MCG/INH 1 puff daily  FEN/GI: Renal Limited fluid Prophylaxis: Heparin  PPx: Hep 5,000 units    Disposition: discharge pending paracentesis and HD given patient's end stage liver disease and ESRD   Subjective:  Patient reports that he is no longer experiencing abdominal pain. He continues to   Objective: Temp:  [97.4 F (36.3 C)-99 F (37.2 C)] 99 F (37.2 C) (08/28 0901) Pulse Rate:  [64-117] 64 (08/28 0901) Resp:  [13-25] 18 (08/28 0901) BP: (94-150)/(49-101) 123/75 (08/28 0901) SpO2:  [91 %-100 %] 93 % (08/28 0901) Weight:  [67.4 kg] 67.4 kg (08/28 0133)  Physical Exam: General: chronically ill appearing male in no acute distress, lying in bed, thin extremities Cardiovascular: mitral stenosis, palpable bilateral radial pulses l Respiratory: CTAB without crackles appreciated, no increased WOB, stable on room air Abdomen: distended with fluid wave, dull to percussion, soft without tenderness to palpation  Extremities: thin extremities, moves all with normal range of motion   Laboratory: Recent Labs  Lab 03/08/19 1850 03/08/19 1857 03/08/19 2200  WBC 9.6  --  9.0  HGB 11.9* 12.9* 11.3*  HCT 33.5* 38.0* 31.9*  PLT 160  --  171   Recent Labs  Lab 03/08/19 1850 03/08/19 1857 03/08/19 2200 03/09/19 0529  NA 131* 129* 130* 134*  K 6.6* 6.5* 5.9* 4.7  CL 90* 94* 89* 94*  CO2 23  --  23 26  BUN 45* 42* 47* 28*  CREATININE 9.74* 9.50* 9.80* 6.58*  CALCIUM 9.0  --  9.0 8.4*  PROT 6.6  --   --   --   BILITOT 1.2  --   --   --  ALKPHOS 105  --   --   --   ALT 13  --   --   --   AST 27  --   --   --   GLUCOSE 60* 56* 95 78   Imaging/Diagnostic Tests: CT Abd and Pelvis 03/08/19  IMPRESSION: Cirrhosis with large volume ascites again noted, stable since prior study.  Severely atrophic kidneys bilaterally with extensive renovascular calcifications.  Aortic and branch vessel atherosclerosis.  Cardiomegaly, small  pericardial effusion.   Stark Klein, MD 03/09/2019, 3:37 PM PGY-1, [place] Intern pager: [phone], text pages welcome

## 2019-03-09 NOTE — Progress Notes (Signed)
Pt refused paracentesis, MD aware.

## 2019-03-09 NOTE — Progress Notes (Signed)
[facility] Progress Note   Subjective:   Seen and examined at bedside.  Feeling better.  Wants to go home.   Objective Vitals:   03/09/19 0133 03/09/19 0202 03/09/19 0854 03/09/19 0901  BP:  (!) 150/96  123/75  Pulse:  (!) 107 88 64  Resp:  18 18 18   Temp:  97.9 F (36.6 C)  99 F (37.2 C)  TempSrc:  Oral  Oral  SpO2:  91% 96% 93%  Weight: 67.4 kg     Height:       Physical Exam General:NAD, chronically ill appearing male Heart:RRR Lungs:CTAB Abdomen:+BS, +distended, NT Extremities:no LE edema Dialysis Access: RU AVF +b   Filed Weights   03/08/19 1517 03/09/19 0133  Weight: 63.5 kg 67.4 kg    Intake/Output Summary (Last 24 hours) at 03/09/2019 1200 Last data filed at 03/09/2019 0900 Gross per 24 hour  Intake 540 ml  Output 611 ml  Net -71 ml    Additional Objective Labs: Basic Metabolic Panel: Recent Labs  Lab 03/08/19 1850 03/08/19 1857 03/08/19 2200 03/09/19 0529  NA 131* 129* 130* 134*  K 6.6* 6.5* 5.9* 4.7  CL 90* 94* 89* 94*  CO2 23  --  23 26  GLUCOSE 60* 56* 95 78  BUN 45* 42* 47* 28*  CREATININE 9.74* 9.50* 9.80* 6.58*  CALCIUM 9.0  --  9.0 8.4*  PHOS  --   --  5.0*  --    Liver Function Tests: Recent Labs  Lab 03/08/19 1850 03/08/19 2200  AST 27  --   ALT 13  --   ALKPHOS 105  --   BILITOT 1.2  --   PROT 6.6  --   ALBUMIN 3.7 3.5   Recent Labs  Lab 03/08/19 1850  LIPASE 21   CBC: Recent Labs  Lab 03/08/19 1850 03/08/19 1857 03/08/19 2200  WBC 9.6  --  9.0  NEUTROABS 6.4  --   --   HGB 11.9* 12.9* 11.3*  HCT 33.5* 38.0* 31.9*  MCV 83.1  --  83.9  PLT 160  --  171    Lab Results  Component Value Date   INR 1.3 (H) 03/08/2019   INR 1.1 02/19/2019   INR 1.2 02/11/2019   Studies/Results: Ct Abdomen Pelvis W Contrast  Result Date: 03/08/2019 CLINICAL DATA:  Abdominal pain, nausea, vomiting EXAM: CT ABDOMEN AND PELVIS WITH CONTRAST TECHNIQUE: Multidetector CT imaging of the abdomen and pelvis was  performed using the standard protocol following bolus administration of intravenous contrast. CONTRAST:  15mL OMNIPAQUE IOHEXOL 300 MG/ML  SOLN COMPARISON:  CT 12/17/2018 FINDINGS: Lower chest: Cardiomegaly. Small pericardial effusion. Calcifications throughout the visualized right coronary artery. Lung bases clear. Hepatobiliary: Changes of cirrhosis. No focal hepatic abnormality. Gallbladder grossly unremarkable. Pancreas: No focal abnormality or ductal dilatation. Spleen: No focal abnormality.  Normal size. Adrenals/Urinary Tract: Severely atrophic kidneys, likely related to end-stage renal disease. Extensive renovascular calcifications. No hydronephrosis. Adrenal glands and urinary bladder unremarkable. Stomach/Bowel: Stomach, large and small bowel grossly unremarkable. Clip again noted within the rectum. Vascular/Lymphatic: No evidence of aneurysm or adenopathy. Diffuse aortic and branch vessel atherosclerosis. Reproductive: No visible focal abnormality. Other: Marked large volume ascites throughout the abdomen and pelvis, stable. Musculoskeletal: No acute bony abnormality. Degenerative disc disease at L5-S1. IMPRESSION: Cirrhosis with large volume ascites again noted, stable since prior study. Severely atrophic kidneys bilaterally with extensive renovascular calcifications. Aortic and branch vessel atherosclerosis. Cardiomegaly, small pericardial effusion. Electronically Signed   By: Lennette Bihari  [place] M.D.   On: 03/08/2019 19:49    Medications: . sodium chloride    . sodium chloride    . albumin human     . Chlorhexidine Gluconate Cloth  6 each Topical Q0600  . fluticasone furoate-vilanterol  1 puff Inhalation Daily  . heparin  5,000 Units Subcutaneous Q8H  . hydrALAZINE  100 mg Oral Daily  . lactulose  45 g Oral BID  . metoprolol tartrate  25 mg Oral BID  . montelukast  10 mg Oral Daily  . [START ON 03/10/2019] pantoprazole  40 mg Oral Once per day on Sun Tue Thu Sat   And  . pantoprazole  40 mg  Oral Q M,W,F-HD  . sevelamer carbonate  3,200 mg Oral TID WC  . zolpidem  5 mg Oral QHS    Dialysis Orders: Center: [facility]  on MWF . EDW 68kg HD Bath 2K/2Ca  Time 4 hours Heparin none. Access RUE AVG BFR 400 DFR 800    Assessment/Plan: 1. Hyperkalemia - 2/2 non compliance w/HD.  He was given Ca gluconate and lokelma 10gm PO.  Improved post HD. K 4.7 2. ESRD -on HD MWF.  Non compliant.  Urgent HD last night.  Labs improved today. Next HD 8/31. 3. Anemia of CKD- Hgb 11.3. No indication for ESA. 4. Secondary hyperparathyroidism - Ca/Phos at goal 5. HTN/volume - BP improved post HD.  Below his EDW but with ascites   6. Nutrition - Renal diet w/fluid restrictions.  7. Cirrhosis w/recurrent ascites - managed by regular large volume paracentesis.  Follow by Dr. Paulita Fujita who is exploring possible TIPS (not candidate for liver transplant d/t severe mitral stenosis & elevated pulmonary artery pressure.  8. Abdominal pain - per primary 9. Non compliance - on going issue.  Discussed again the importance of attending every dialysis.  [name], PA-C [facility] Pager: [phone] 03/09/2019,12:00 PM  LOS: 1 day

## 2019-03-09 NOTE — Progress Notes (Signed)
FPTS Interim Progress Note  S:  During morning rounds, patient stated that he did not want the paracentesis nor HD today as he felt "dehydrated already". Patient then proceeded to say that he did not want anyone else to knock on his door unless it was to say he was being discharged. I informed the patient that IR had already agreed to perform his paracentesis but was unsure of when it would occur. Patient stated he did not want the procedure and continued stating that GI had told him "there was nothing else they can do for me".   I returned to patient's room in the afternoon to ask again if he would prefer to have the paracentesis or be discharged. The patient responded that he would get the paracentesis if it could be done today. I reached out to IR and was informed that they would try to fit him in today 03/09/19 but may have to push procedure to tomorrow 03/10/19.   Around 5 PM I returned to patient's room after being paged that he wanted an update on his treatment plan and that is was not true that he had refused the paracentesis and reported abdominal pain and requested pain medications. I again asked patient to clarify his desire for the paracentesis and he stated, "I'll stay another night and get it [the paracentesis] then." I informed the patient that I would order pain medications for him.   O: BP (!) 143/84 (BP Location: Left Arm)   Pulse 76   Temp 98.6 F (37 C) (Oral)   Resp 18   Ht 5\' 10"  (1.778 m)   Wt 67.4 kg Comment: stood to scale   SpO2 96%   BMI 21.32 kg/m     A/P: Abdominal Pain with Ascites  -Patient offered Tylenol 650mg   -Ordered Fentanyl 25 mg (2 orders for a total of 50 mg)    Stark Klein, MD 03/09/2019, 7:36 PM PGY-1, [place] Medicine Service pager [phone]

## 2019-03-10 ENCOUNTER — Inpatient Hospital Stay (HOSPITAL_COMMUNITY): Payer: Medicare Other

## 2019-03-10 LAB — BODY FLUID CELL COUNT WITH DIFFERENTIAL
Lymphs, Fluid: 48 %
Monocyte-Macrophage-Serous Fluid: 45 % — ABNORMAL LOW (ref 50–90)
Neutrophil Count, Fluid: 7 % (ref 0–25)
Total Nucleated Cell Count, Fluid: 1380 cu mm — ABNORMAL HIGH (ref 0–1000)

## 2019-03-10 MED ORDER — LIDOCAINE HCL (PF) 1 % IJ SOLN
INTRAMUSCULAR | Status: AC
  Filled 2019-03-10: qty 30

## 2019-03-10 MED ORDER — CHLORHEXIDINE GLUCONATE CLOTH 2 % EX PADS: 6.0000 | MEDICATED_PAD | Freq: Every day | CUTANEOUS | Status: DC

## 2019-03-10 NOTE — Procedures (Signed)
PROCEDURE SUMMARY:  Successful US guided paracentesis from RLQ.  Yielded 4.1 L of clear yellow fluid.  No immediate complications.  Pt tolerated well.   Specimen was sent for labs.  EBL < 36mL  Ascencion Dike PA-C 03/10/2019 11:54 AM

## 2019-03-10 NOTE — Discharge Instructions (Signed)
Dear [name],   Thank you for letting us participate in your care! In this section, you will find a brief hospital admission summary of why you were admitted to the hospital, what happened during your admission, your diagnosis/diagnoses, and recommended follow up.   You were admitted because you were experiencing abdominal pain and was requesting paracentesis.  You also received emergency dialysis for high potassium in your blood when you arrived.  There was no further work-up or treatment that you wanted to perform while you are in the hospital and you were discharged after paracentesis.  Please continue to follow-up with gastroenterology and your primary care provider closely.  DOCTOR'S APPOINTMENT & FOLLOW UP CARE INSTRUCTIONS  Future Appointments  Date Time Provider [place]  03/13/2019  1:00 PM Berneta Sages, NP ACP-ACP None  03/15/2019  1:00 PM MC-IR 3 MC-IR [facility]  03/29/2019  1:00 PM MC-IR 3 MC-IR [facility]  04/12/2019  1:00 PM MC-IR 3 MC-IR [facility]  04/30/2019  9:00 AM Vyas, Sylvan Cheese, MD PCV-PCV None     Thank you for choosing [facility]! Take care and be well!  [facility]  [place], [place] [address] [phone]

## 2019-03-10 NOTE — Progress Notes (Addendum)
Family Medicine Teaching Service Daily Progress Note Intern Pager: [phone]  Patient name: [name] Medical record number: [id number] Date of birth: May 20, 1961 Age: 58 y.o. Gender: male  Primary Care Provider: [name], FNP Consultants: Nephrology  Code Status: DNR   Pt Overview and Major Events to Date:  Hospital Day: 3 03/08/2019: admitted for Weakness   Assessment and Plan: [name] is a 58 y.o. male who presented w/ abdominal pain and diffuse ascites in the setting of decompensated end-stage liver failure.  Patient also presented with hyperkalemia.PMHx s/f ESRD on HD Monday Wednesday Friday.  # Ascites # ESLD 2/2 chronic, untreated HBV & HCV Followed by Dr. Hilarie Fredrickson with GI.  Patient has scheduled paracenteses outpatient every 2 weeks.  Yesterday, IR was consulted but patient refused procedure yesterday.  Called over to IR this morning and they will call ultrasound to be put on the list for paracentesis today.  Likely discharge after paracentesis if vital signs and potassium stable  Follow-up with GI outpatient  #Hyperkalemia #ESRD, HD MWF Refused to schedule HD yesterday as he had emergent HD on Thursday.  Nephrology consulted  Plan per nephrology recommendations  Renal diet, fluid restriction  #Hypertension  Stable. Range BP: (138-143)/(71-85) 143/71 (08/29 0811)  Continue home hydralazine 100 mg daily and metoprolol 25 mg twice daily.   Volume control via HD  #COPD Stable, satting well on room air.   Substitute formulary Brio Ellipta 1 puff daily (home Symbicort 2 puffs twice daily)  Pressure injury left buttocks . Reposition  . OOB as tolerated  . Wound care PRN per nursing  #FEN/GI:  . Fluids: restriction 1200cc . Electrolytes: wnl . Nutrition: Renal diet    Access: RUE Fistula,  PIV L VTE prophylaxis: Heparin 5000 Q8H   Disposition: Home    Subjective:  NAEO.   Objective: Temp:  [98.3 F (36.8 C)-98.6 F (37  C)] 98.3 F (36.8 C) (08/29 0811) Pulse Rate:  [60-76] 60 (08/29 0811) Resp:  [18] 18 (08/29 0811) BP: (138-143)/(71-85) 143/71 (08/29 0811) SpO2:  [92 %-100 %] 92 % (08/29 0819) Weight:  [67.4 kg] 67.4 kg (08/28 2040) Intake/Output      08/28 0701 - 08/29 0700 08/29 0701 - 08/30 0700   P.O. 1140 240   Total Intake(mL/kg) 1140 (16.9) 240 (3.6)   Urine (mL/kg/hr) 0 (0)    Other     Stool 0    Total Output 0    Net +1140 +240        Urine Occurrence 0 x    Stool Occurrence 1 x        Physical Exam: General: NAD, ill appearing male, lying in bed sleeping comfortably.  HEENT: [place]/AT. Temporal wasting. Periorbital swelling  Cardiovascular: RRR, normal S1, S2. B/L 2+ RP. No BLEE Respiratory: CTAB. No IWOB.  Abdomen: + BS. + Fluid wave. Distended, dull to percussion.  Extremities: Warm and well perfused. Moving spontaneously. Lower and upper extremities are thin. Mil asterixis on exam.  Integumentary: both upper extremities with fistula placement.  Neuro: A & O x4. CN grossly intact. No FND  Laboratory: I have personally read and reviewed all labs and imaging studies.  CBC: Recent Labs  Lab 03/08/19 1850 03/08/19 1857 03/08/19 2200  WBC 9.6  --  9.0  NEUTROABS 6.4  --   --   HGB 11.9* 12.9* 11.3*  HCT 33.5* 38.0* 31.9*  MCV 83.1  --  83.9  PLT 160  --  171  CMP: Recent Labs  Lab 03/08/19 1850 03/08/19 1857 03/08/19 2200 03/09/19 0529  NA 131* 129* 130* 134*  K 6.6* 6.5* 5.9* 4.7  CL 90* 94* 89* 94*  CO2 23  --  23 26  GLUCOSE 60* 56* 95 78  BUN 45* 42* 47* 28*  CREATININE 9.74* 9.50* 9.80* 6.58*  CALCIUM 9.0  --  9.0 8.4*  PHOS  --   --  5.0*  --   ALBUMIN 3.7  --  3.5  --    CBG: No results for input(s): GLUCAP in the last 168 hours. Micro: Covid Negative   Imaging/Diagnostic Tests: Ct Abdomen Pelvis W Contrast  Result Date: 03/08/2019 CLINICAL DATA:  Abdominal pain, nausea, vomiting EXAM: CT ABDOMEN AND PELVIS WITH CONTRAST TECHNIQUE: Multidetector  CT imaging of the abdomen and pelvis was performed using the standard protocol following bolus administration of intravenous contrast. CONTRAST:  152mL OMNIPAQUE IOHEXOL 300 MG/ML  SOLN COMPARISON:  CT 12/17/2018 FINDINGS: Lower chest: Cardiomegaly. Small pericardial effusion. Calcifications throughout the visualized right coronary artery. Lung bases clear. Hepatobiliary: Changes of cirrhosis. No focal hepatic abnormality. Gallbladder grossly unremarkable. Pancreas: No focal abnormality or ductal dilatation. Spleen: No focal abnormality.  Normal size. Adrenals/Urinary Tract: Severely atrophic kidneys, likely related to end-stage renal disease. Extensive renovascular calcifications. No hydronephrosis. Adrenal glands and urinary bladder unremarkable. Stomach/Bowel: Stomach, large and small bowel grossly unremarkable. Clip again noted within the rectum. Vascular/Lymphatic: No evidence of aneurysm or adenopathy. Diffuse aortic and branch vessel atherosclerosis. Reproductive: No visible focal abnormality. Other: Marked large volume ascites throughout the abdomen and pelvis, stable. Musculoskeletal: No acute bony abnormality. Degenerative disc disease at L5-S1. IMPRESSION: Cirrhosis with large volume ascites again noted, stable since prior study. Severely atrophic kidneys bilaterally with extensive renovascular calcifications. Aortic and branch vessel atherosclerosis. Cardiomegaly, small pericardial effusion. Electronically Signed   By: Rolm Baptise M.D.   On: 03/08/2019 19:49    EKG Interpretation  Date/Time:  Thursday March 08 2019 15:00:19 EDT Ventricular Rate:  141 PR Interval:    QRS Duration: 180 QT Interval:  384 QTC Calculation: 589 R Axis:   -89 Text Interpretation:  Sinus rhythm Left bundle branch block Confirmed by Lajean Saver [phone]) on 03/08/2019 3:05:03 PM        Procedures:    Wilber Oliphant, MD 03/10/2019, 9:31 AM PGY-2, [place] Intern pager: [phone], text  pages welcome

## 2019-03-10 NOTE — Progress Notes (Signed)
Refused to go on his hemodialysis treatment.

## 2019-03-10 NOTE — Progress Notes (Signed)
[facility] Progress Note   Subjective:   Patient seen and examined at bedside.  Tired, wants to sleep and not answer questions.  Agreed to dialysis today and also to have paracentesis.    Objective Vitals:   03/09/19 2040 03/10/19 0516 03/10/19 0811 03/10/19 0819  BP: (!) 141/80 138/85 (!) 143/71   Pulse: 64 60 60   Resp: 18 18 18    Temp: 98.4 F (36.9 C) 98.6 F (37 C) 98.3 F (36.8 C)   TempSrc: Oral Oral Oral   SpO2: 100% 100% 95% 92%  Weight: 67.4 kg     Height:       Physical Exam General:NAD, chronically ill appearing male Heart:RRR Lungs:CTAB Abdomen:distended, +BS Extremities:no LE edema Dialysis Access: RU AVF +b   Filed Weights   03/08/19 1517 03/09/19 0133 03/09/19 2040  Weight: 63.5 kg 67.4 kg 67.4 kg    Intake/Output Summary (Last 24 hours) at 03/10/2019 1025 Last data filed at 03/10/2019 0812 Gross per 24 hour  Intake 1080 ml  Output 0 ml  Net 1080 ml    Additional Objective Labs: Basic Metabolic Panel: Recent Labs  Lab 03/08/19 1850 03/08/19 1857 03/08/19 2200 03/09/19 0529  NA 131* 129* 130* 134*  K 6.6* 6.5* 5.9* 4.7  CL 90* 94* 89* 94*  CO2 23  --  23 26  GLUCOSE 60* 56* 95 78  BUN 45* 42* 47* 28*  CREATININE 9.74* 9.50* 9.80* 6.58*  CALCIUM 9.0  --  9.0 8.4*  PHOS  --   --  5.0*  --    Liver Function Tests: Recent Labs  Lab 03/08/19 1850 03/08/19 2200  AST 27  --   ALT 13  --   ALKPHOS 105  --   BILITOT 1.2  --   PROT 6.6  --   ALBUMIN 3.7 3.5   Recent Labs  Lab 03/08/19 1850  LIPASE 21   CBC: Recent Labs  Lab 03/08/19 1850 03/08/19 1857 03/08/19 2200  WBC 9.6  --  9.0  NEUTROABS 6.4  --   --   HGB 11.9* 12.9* 11.3*  HCT 33.5* 38.0* 31.9*  MCV 83.1  --  83.9  PLT 160  --  171   Lab Results  Component Value Date   INR 1.3 (H) 03/08/2019   INR 1.1 02/19/2019   INR 1.2 02/11/2019   Studies/Results: Ct Abdomen Pelvis W Contrast  Result Date: 03/08/2019 CLINICAL DATA:  Abdominal pain,  nausea, vomiting EXAM: CT ABDOMEN AND PELVIS WITH CONTRAST TECHNIQUE: Multidetector CT imaging of the abdomen and pelvis was performed using the standard protocol following bolus administration of intravenous contrast. CONTRAST:  162mL OMNIPAQUE IOHEXOL 300 MG/ML  SOLN COMPARISON:  CT 12/17/2018 FINDINGS: Lower chest: Cardiomegaly. Small pericardial effusion. Calcifications throughout the visualized right coronary artery. Lung bases clear. Hepatobiliary: Changes of cirrhosis. No focal hepatic abnormality. Gallbladder grossly unremarkable. Pancreas: No focal abnormality or ductal dilatation. Spleen: No focal abnormality.  Normal size. Adrenals/Urinary Tract: Severely atrophic kidneys, likely related to end-stage renal disease. Extensive renovascular calcifications. No hydronephrosis. Adrenal glands and urinary bladder unremarkable. Stomach/Bowel: Stomach, large and small bowel grossly unremarkable. Clip again noted within the rectum. Vascular/Lymphatic: No evidence of aneurysm or adenopathy. Diffuse aortic and branch vessel atherosclerosis. Reproductive: No visible focal abnormality. Other: Marked large volume ascites throughout the abdomen and pelvis, stable. Musculoskeletal: No acute bony abnormality. Degenerative disc disease at L5-S1. IMPRESSION: Cirrhosis with large volume ascites again noted, stable since prior study. Severely atrophic kidneys bilaterally with extensive  renovascular calcifications. Aortic and branch vessel atherosclerosis. Cardiomegaly, small pericardial effusion. Electronically Signed   By: Rolm Baptise M.D.   On: 03/08/2019 19:49    Medications: . sodium chloride    . sodium chloride    . albumin human     . Chlorhexidine Gluconate Cloth  6 each Topical Q0600  . fluticasone furoate-vilanterol  1 puff Inhalation Daily  . heparin  5,000 Units Subcutaneous Q8H  . hydrALAZINE  100 mg Oral Daily  . lactulose  45 g Oral BID  . metoprolol tartrate  25 mg Oral BID  . montelukast  10 mg  Oral Daily  . pantoprazole  40 mg Oral Once per day on Sun Tue Thu Sat   And  . pantoprazole  40 mg Oral Q M,W,F-HD  . sevelamer carbonate  3,200 mg Oral TID WC  . zolpidem  5 mg Oral QHS    Dialysis Orders: Center:[facility] MWF. EDW68kgHD Bath 2K/2CaTime 4 hoursHeparin none. AccessRUE AVGBFR 400DFR 800  Assessment/Plan: 1. Hyperkalemia - 2/2 non compliance w/HD.  He was given Ca gluconate and lokelma 10gm PO on admission.  Improved post HD. K 4.7 2. ESRD -on HD MWF.  Non compliant.  Orders written for make up HD today.  Resume regular schedule on Monday.  3. Anemia of CKD- Hgb 11.3. No indication for ESA. 4. Secondary hyperparathyroidism - Ca/Phos at goal 5. HTN/volume - BP improved post HD.  Below his EDW but with ascites   6. Nutrition - Renal diet w/fluid restrictions.  7. Cirrhosis w/recurrent ascites - managed by regular large volume paracentesis.  Follow by Dr. Paulita Fujita who is exploring possible TIPS (not candidate for liver transplant d/t severe mitral stenosis & elevated pulmonary artery pressure. Refused paracentesis yesterday, agreed to have it done today.  8. Abdominal pain - per primary 9. Non compliance - on going issue.  Discussed again the importance of attending every dialysis.  [name], PA-C [facility] Pager: [phone] 03/10/2019,10:25 AM  LOS: 2 days

## 2019-03-10 NOTE — Plan of Care (Signed)
Stable to be discharged.Refused to have his HD treatment.

## 2019-03-10 NOTE — Progress Notes (Signed)
DISCHARGE NOTE HOME [name] to be discharged home per MD order. Discussed prescriptions and follow up appointments with the patient. Prescriptions given to patient; medication list explained in detail. Patient verbalized understanding.  Skin clean, dry and intact without evidence of skin break down, no evidence of skin tears noted. IV catheter discontinued intact. Site without signs and symptoms of complications. Dressing and pressure applied. Pt denies pain at the site currently. No complaints noted.  Patient free of lines, drains, and wounds.   An After Visit Summary (AVS) was printed and given to the patient. Patient escorted via wheelchair, and discharged home via private auto.  [place], Zenon Mayo, RN

## 2019-03-12 DIAGNOSIS — D631 Anemia in chronic kidney disease: Secondary | ICD-10-CM | POA: Diagnosis not present

## 2019-03-12 DIAGNOSIS — Z992 Dependence on renal dialysis: Secondary | ICD-10-CM | POA: Diagnosis not present

## 2019-03-12 DIAGNOSIS — D509 Iron deficiency anemia, unspecified: Secondary | ICD-10-CM | POA: Diagnosis not present

## 2019-03-12 DIAGNOSIS — N2581 Secondary hyperparathyroidism of renal origin: Secondary | ICD-10-CM | POA: Diagnosis not present

## 2019-03-12 DIAGNOSIS — N186 End stage renal disease: Secondary | ICD-10-CM | POA: Diagnosis not present

## 2019-03-12 LAB — PATHOLOGIST SMEAR REVIEW

## 2019-03-13 ENCOUNTER — Other Ambulatory Visit: Payer: Medicare Other | Admitting: Adult Health Nurse Practitioner

## 2019-03-13 ENCOUNTER — Other Ambulatory Visit: Payer: Self-pay

## 2019-03-13 DIAGNOSIS — Z515 Encounter for palliative care: Secondary | ICD-10-CM

## 2019-03-13 DIAGNOSIS — N186 End stage renal disease: Secondary | ICD-10-CM | POA: Diagnosis not present

## 2019-03-13 DIAGNOSIS — Z992 Dependence on renal dialysis: Secondary | ICD-10-CM | POA: Diagnosis not present

## 2019-03-13 DIAGNOSIS — I120 Hypertensive chronic kidney disease with stage 5 chronic kidney disease or end stage renal disease: Secondary | ICD-10-CM | POA: Diagnosis not present

## 2019-03-13 NOTE — Progress Notes (Signed)
[facility] Consult Note Telephone: [phone]  Fax: [phone]  PATIENT NAME: [name] DOB: January 27, 1961 MRN: [id number]  PRIMARY CARE PROVIDER:   [name], FNP  REFERRING PROVIDER:  [name] [place],  [place] [address]  RESPONSIBLE PARTY:   Self [phone]  Due to the COVID-19 crisis, this visit was done via telemedicine and it was initiated and consent by this patient and or family. Video-audio (telehealth) contact was unable to be done due to technical barriers from the patient's side.         RECOMMENDATIONS and PLAN:  1.  Advance care planning.  Patient listed as DNR in Epic note on 01/02/2019. He wants to stay home for as long as possible.  Patient was getting upset with present situation and did not go over further.  2.  ESRD on HD.  Has dialysis M,W,F.  Documentation shows noncompliance with dialysis.  Patient states that he goes most days but at times leaves early due to feeling sick.  With multiple health issues including cirrhosis of the liver with ascites he is encouraged to stay for his full sessions.    3.  Cirrhosis of the liver.  Patient has multiple ER visits due ascites.  His last was 8/27-8/29.  And today he states that he can already feel the fluid building up in his abdomen.  He does take lactulose 30g BID and he expresses compliance with this.  He has been seen at [facility] and he states that they told him there was nothing they could do for him.  He is not a candidate for liver transplant or TIPS due to his pulmonary HTN and CHF.  This leaves him with the option of multiple paracenteses.  This is frustrating to him and he feels like there should be a better option. He is being followed by nephrology and GI.  Reached out to my supervising physician for any advice and he is in agreement that everything is being done that can be and that the only option would be hospice  if he wanted to stop the paracenteses and dialysis.  Have spoken with patient about hospice and he did get upset with me about it.  He is not ready for hospice.  Patient needs to continue what he is doing and palliative will continue to monitor for readiness for hospice.  I spent 30 minutes providing this consultation,  from 1:00 to 1:30. More than 50% of the time in this consultation was spent coordinating communication.   HISTORY OF PRESENT ILLNESS:  [name] is a 58 y.o. year old male with multiple medical problems including ESRD on HD, cirrhosis of the liver, Hep C, CAD, pulmonary HTN, CHF, anemia. Palliative Care was asked to help address goals of care.   CODE STATUS: DNR  PPS: 0% HOSPICE ELIGIBILITY/DIAGNOSIS: TBD  PAST MEDICAL HISTORY:  Past Medical History:  Diagnosis Date  . Anemia   . Anxiety   . Arthritis    left shoulder  . Atherosclerosis of aorta ([place])   . Cardiomegaly   . Chest pain    DATE UNKNOWN, C/O PERIODICALLY  . Cocaine abuse ([place])   . COPD exacerbation ([place]) 08/17/2016  . Coronary artery disease    stent 02/22/17  . ESRD (end stage renal disease) on dialysis ([place])    "[name]; MWF" (07/04/2017)  . GERD (gastroesophageal reflux disease)    DATE UNKNOWN  .  Hemorrhoids   . Hepatitis B, chronic ([place])   . Hepatitis C   . History of kidney stones   . Hyperkalemia   . Hypertension   . Kidney failure   . Metabolic bone disease    Patient denies  . Mitral stenosis   . Myocardial infarction ([place])   . Pneumonia   . Pulmonary edema   . Solitary rectal ulcer syndrome 07/2017   at [facility] for rectal bleeding  . Tubular adenoma of colon     SOCIAL HX:  Social History   Tobacco Use  . Smoking status: Current Every Day Smoker    Packs/day: 0.50    Years: 43.00    Pack years: 21.50    Types: Cigarettes    Start date: 08/13/1973  . Smokeless tobacco: Never Used  Substance Use Topics  . Alcohol use: Not Currently    Frequency: Never     Comment: quit drinking in 2017    ALLERGIES:  Allergies  Allergen Reactions  . Morphine And Related Other (See Comments)    Stomach pain  . Aspirin Other (See Comments)    STOMACH PAIN  . Clonidine Derivatives Itching  . Tramadol Itching  . Tylenol [Acetaminophen] Nausea Only    Stomach ache     PERTINENT MEDICATIONS:  Outpatient Encounter Medications as of 03/13/2019  Medication Sig  . albuterol (VENTOLIN HFA) 108 (90 Base) MCG/ACT inhaler Inhale 2 puffs into the lungs every 6 (six) hours as needed for wheezing or shortness of breath.  . hydrALAZINE (APRESOLINE) 100 MG tablet Take 100 mg by mouth daily.  . hydrOXYzine (ATARAX/VISTARIL) 25 MG tablet Take 25 mg by mouth 2 (two) times daily as needed.  . lactulose (CHRONULAC) 10 GM/15ML solution TAKE 45 MLS BY MOUTH 2 TIMES DAILY. (Patient taking differently: Take 45 g by mouth 2 (two) times daily. )  . metoprolol tartrate (LOPRESSOR) 25 MG tablet Take 1 tablet (25 mg total) by mouth 2 (two) times daily.  . montelukast (SINGULAIR) 10 MG tablet Take 10 mg by mouth daily.  . ondansetron (ZOFRAN) 8 MG tablet Take 8 mg by mouth every 8 (eight) hours as needed for nausea or vomiting.   . pantoprazole (PROTONIX) 40 MG tablet Take 1 tablet (40 mg total) by mouth See admin instructions. Take one tablet (40 mg) by mouth in the morning on Sunday, Tuesday, Thursday, Saturday, take one tablet (40 mg) after dialysis on Monday, Wednesday, Friday. May also take one tablet (40 mg) in the evening as needed for acid reflux.  . sevelamer carbonate (RENVELA) 800 MG tablet Take 4 tablets with meals  And 2 tablets twice daily with snacks (Patient taking differently: Take 1,600-3,200 mg by mouth See admin instructions. Take 3200 mg three time a day with meals, and 1600 mg twice daily with snacks)  . sulfamethoxazole-trimethoprim (BACTRIM DS) 800-160 MG tablet Take 1 tablet by mouth daily.  . SYMBICORT 160-4.5 MCG/ACT inhaler Inhale 2 puffs into the lungs 2 (two)  times a day.  . zolpidem (AMBIEN) 5 MG tablet Take 5 mg by mouth at bedtime.   Facility-Administered Encounter Medications as of 03/13/2019  Medication  . albumin human 25 % solution 50 g       Berneta Sages, NP

## 2019-03-14 ENCOUNTER — Telehealth: Payer: Self-pay | Admitting: Adult Health Nurse Practitioner

## 2019-03-14 NOTE — Telephone Encounter (Signed)
Called patient to discuss what I discussed with my supervising physician.  He is not a candidate for TIPS or liver transplant, and that leaves paracenteses.  He states that he is scheduled for this every 2 weeks but the fluid is building up before that and states that he did not go to dialysis today due to pain caused by the fluid.  Did discuss going to dialysis as much as possible to help with the fluid build up.  Also discussed hospice and he wanted to call back later. This will be an ongoing discussion as his paracenteses is occurring more frequently [name] K. Olena Heckle NP

## 2019-03-15 ENCOUNTER — Ambulatory Visit (HOSPITAL_COMMUNITY)
Admission: RE | Admit: 2019-03-15 | Discharge: 2019-03-15 | Disposition: A | Discharge status: Home / Self Care | Payer: Medicare Other | Source: Ambulatory Visit | Attending: Emergency Medicine | Admitting: Emergency Medicine

## 2019-03-15 ENCOUNTER — Encounter (HOSPITAL_COMMUNITY): Payer: Self-pay | Admitting: Radiology

## 2019-03-15 ENCOUNTER — Other Ambulatory Visit: Payer: Self-pay

## 2019-03-15 DIAGNOSIS — R188 Other ascites: Secondary | ICD-10-CM | POA: Insufficient documentation

## 2019-03-15 DIAGNOSIS — K7290 Hepatic failure, unspecified without coma: Secondary | ICD-10-CM | POA: Insufficient documentation

## 2019-03-15 HISTORY — PX: IR PARACENTESIS: IMG2679

## 2019-03-15 MED ORDER — LIDOCAINE HCL (PF) 1 % IJ SOLN
INTRAMUSCULAR | Status: DC | PRN
  Administered 2019-03-15: 10 mL

## 2019-03-15 MED ORDER — LIDOCAINE HCL 1 % IJ SOLN
INTRAMUSCULAR | Status: AC
  Filled 2019-03-15: qty 20

## 2019-03-15 NOTE — Procedures (Addendum)
Ultrasound-guided  therapeutic paracentesis performed yielding 4.1 liters of amber fluid. No immediate complications. EBL < 1cc.

## 2019-03-16 DIAGNOSIS — D509 Iron deficiency anemia, unspecified: Secondary | ICD-10-CM | POA: Diagnosis not present

## 2019-03-16 DIAGNOSIS — D631 Anemia in chronic kidney disease: Secondary | ICD-10-CM | POA: Diagnosis not present

## 2019-03-16 DIAGNOSIS — N2581 Secondary hyperparathyroidism of renal origin: Secondary | ICD-10-CM | POA: Diagnosis not present

## 2019-03-16 DIAGNOSIS — N186 End stage renal disease: Secondary | ICD-10-CM | POA: Diagnosis not present

## 2019-03-16 DIAGNOSIS — Z992 Dependence on renal dialysis: Secondary | ICD-10-CM | POA: Diagnosis not present

## 2019-03-19 ENCOUNTER — Encounter (HOSPITAL_COMMUNITY): Payer: Self-pay | Admitting: Emergency Medicine

## 2019-03-19 ENCOUNTER — Emergency Department (HOSPITAL_COMMUNITY)
Admission: EM | Admit: 2019-03-19 | Discharge: 2019-03-19 | Disposition: A | Discharge status: Home / Self Care | Payer: Medicare Other | Attending: Emergency Medicine | Admitting: Emergency Medicine

## 2019-03-19 ENCOUNTER — Other Ambulatory Visit: Payer: Self-pay

## 2019-03-19 DIAGNOSIS — N186 End stage renal disease: Secondary | ICD-10-CM | POA: Diagnosis not present

## 2019-03-19 DIAGNOSIS — J449 Chronic obstructive pulmonary disease, unspecified: Secondary | ICD-10-CM | POA: Diagnosis not present

## 2019-03-19 DIAGNOSIS — Z955 Presence of coronary angioplasty implant and graft: Secondary | ICD-10-CM | POA: Diagnosis not present

## 2019-03-19 DIAGNOSIS — I259 Chronic ischemic heart disease, unspecified: Secondary | ICD-10-CM | POA: Insufficient documentation

## 2019-03-19 DIAGNOSIS — R197 Diarrhea, unspecified: Secondary | ICD-10-CM

## 2019-03-19 DIAGNOSIS — R1030 Lower abdominal pain, unspecified: Secondary | ICD-10-CM

## 2019-03-19 DIAGNOSIS — F17210 Nicotine dependence, cigarettes, uncomplicated: Secondary | ICD-10-CM | POA: Insufficient documentation

## 2019-03-19 DIAGNOSIS — Z79899 Other long term (current) drug therapy: Secondary | ICD-10-CM | POA: Insufficient documentation

## 2019-03-19 DIAGNOSIS — Z992 Dependence on renal dialysis: Secondary | ICD-10-CM | POA: Diagnosis not present

## 2019-03-19 DIAGNOSIS — R000 Tachycardia, unspecified: Secondary | ICD-10-CM | POA: Diagnosis not present

## 2019-03-19 DIAGNOSIS — I252 Old myocardial infarction: Secondary | ICD-10-CM | POA: Diagnosis not present

## 2019-03-19 DIAGNOSIS — I491 Atrial premature depolarization: Secondary | ICD-10-CM | POA: Diagnosis not present

## 2019-03-19 DIAGNOSIS — I499 Cardiac arrhythmia, unspecified: Secondary | ICD-10-CM | POA: Diagnosis not present

## 2019-03-19 DIAGNOSIS — R1084 Generalized abdominal pain: Secondary | ICD-10-CM | POA: Diagnosis not present

## 2019-03-19 DIAGNOSIS — I4891 Unspecified atrial fibrillation: Secondary | ICD-10-CM | POA: Diagnosis not present

## 2019-03-19 DIAGNOSIS — I120 Hypertensive chronic kidney disease with stage 5 chronic kidney disease or end stage renal disease: Secondary | ICD-10-CM | POA: Diagnosis not present

## 2019-03-19 LAB — LIPASE, BLOOD: Lipase: 26 U/L (ref 11–51)

## 2019-03-19 LAB — CBC WITH DIFFERENTIAL/PLATELET
Abs Immature Granulocytes: 0.14 10*3/uL — ABNORMAL HIGH (ref 0.00–0.07)
Basophils Absolute: 0.1 10*3/uL (ref 0.0–0.1)
Basophils Relative: 1 %
Eosinophils Absolute: 0.1 10*3/uL (ref 0.0–0.5)
Eosinophils Relative: 1 %
HCT: 32.9 % — ABNORMAL LOW (ref 39.0–52.0)
Hemoglobin: 11.5 g/dL — ABNORMAL LOW (ref 13.0–17.0)
Immature Granulocytes: 1 %
Lymphocytes Relative: 13 %
Lymphs Abs: 1.3 10*3/uL (ref 0.7–4.0)
MCH: 29.5 pg (ref 26.0–34.0)
MCHC: 35 g/dL (ref 30.0–36.0)
MCV: 84.4 fL (ref 80.0–100.0)
Monocytes Absolute: 0.8 10*3/uL (ref 0.1–1.0)
Monocytes Relative: 8 %
Neutro Abs: 7.5 10*3/uL (ref 1.7–7.7)
Neutrophils Relative %: 76 %
Platelets: 191 10*3/uL (ref 150–400)
RBC: 3.9 MIL/uL — ABNORMAL LOW (ref 4.22–5.81)
RDW: 13.8 % (ref 11.5–15.5)
WBC: 9.9 10*3/uL (ref 4.0–10.5)
nRBC: 0 % (ref 0.0–0.2)

## 2019-03-19 LAB — COMPREHENSIVE METABOLIC PANEL
ALT: 14 U/L (ref 0–44)
AST: 28 U/L (ref 15–41)
Albumin: 3.1 g/dL — ABNORMAL LOW (ref 3.5–5.0)
Alkaline Phosphatase: 85 U/L (ref 38–126)
Anion gap: 14 (ref 5–15)
BUN: 46 mg/dL — ABNORMAL HIGH (ref 6–20)
CO2: 25 mmol/L (ref 22–32)
Calcium: 8.6 mg/dL — ABNORMAL LOW (ref 8.9–10.3)
Chloride: 97 mmol/L — ABNORMAL LOW (ref 98–111)
Creatinine, Ser: 9.76 mg/dL — ABNORMAL HIGH (ref 0.61–1.24)
GFR calc Af Amer: 6 mL/min — ABNORMAL LOW (ref >60)
GFR calc non Af Amer: 5 mL/min — ABNORMAL LOW (ref >60)
Glucose, Bld: 104 mg/dL — ABNORMAL HIGH (ref 70–99)
Potassium: 5.4 mmol/L — ABNORMAL HIGH (ref 3.5–5.1)
Sodium: 136 mmol/L (ref 135–145)
Total Bilirubin: 0.9 mg/dL (ref 0.3–1.2)
Total Protein: 5.8 g/dL — ABNORMAL LOW (ref 6.5–8.1)

## 2019-03-19 MED ORDER — METOPROLOL TARTRATE 5 MG/5ML IV SOLN
2.5000 mg | Freq: Once | INTRAVENOUS | Status: AC
  Administered 2019-03-19: 12:00:00 2.5 mg via INTRAVENOUS
  Filled 2019-03-19: qty 5

## 2019-03-19 MED ORDER — FENTANYL CITRATE (PF) 100 MCG/2ML IJ SOLN
25.0000 ug | Freq: Once | INTRAMUSCULAR | Status: AC
  Administered 2019-03-19: 25 ug via INTRAVENOUS
  Filled 2019-03-19: qty 2

## 2019-03-19 NOTE — ED Provider Notes (Signed)
[facility] EMERGENCY DEPARTMENT Provider Note   CSN: [id number] Arrival date & time: 03/19/19  3149     History   Chief Complaint Chief Complaint  Patient presents with   Abdominal Pain    HPI [name] is a 58 y.o. male with history of CAD, COPD, ESRD on dialysis x10 years Monday Wednesday Friday, hepatitis, MI, cirrhosis, hypertension presents today for abdominal pain and diarrhea x4 days.  Patient reports that on 03/15/2019 he underwent therapeutic paracentesis yielding approximately 4 L of fluid, he reports that he undergoes paracentesis every 2 weeks for ascites.  He reports that he was feeling well after this procedure and then went to [place] and had a [place] sandwich.  Patient reports that shortly after eating a [place] sandwich he developed diarrhea and a lower abdominal cramping sensation.  Patient reports nonbloody diarrhea x4 days has been slowly improving.  He describes a moderate intensity lower abdominal cramping sensation worsened with diarrhea and improved with rest, he has taken Pepto-Bismol for his symptoms with some relief, he denies radiation of pain.  Patient has fever/chills, headache/vision changes, chest pain/shortness of breath, nausea/vomiting, testicular pain/swelling, pain to extremities or any additional concerns.  Of note patient reports being on dialysis for 10+ years and does not produce urine.  He last underwent dialysis on Friday, 3 days ago.  He was going to dialysis today however when his dialysis center heard he was having diarrhea and abdominal pain they sent him to this ED for further evaluation.    HPI  Past Medical History:  Diagnosis Date   Anemia    Anxiety    Arthritis    left shoulder   Atherosclerosis of aorta (HCC)    Cardiomegaly    Chest pain    DATE UNKNOWN, C/O PERIODICALLY   Cocaine abuse (HCC)    COPD exacerbation ([place]) 08/17/2016   Coronary artery disease    stent 02/22/17   ESRD (end  stage renal disease) on dialysis ([place])    "[name]; MWF" (07/04/2017)   GERD (gastroesophageal reflux disease)    DATE UNKNOWN   Hemorrhoids    Hepatitis B, chronic (HCC)    Hepatitis C    History of kidney stones    Hyperkalemia    Hypertension    Kidney failure    Metabolic bone disease    Patient denies   Mitral stenosis    Myocardial infarction [facility])    Pneumonia    Pulmonary edema    Solitary rectal ulcer syndrome 07/2017   at flex sig for rectal bleeding   Tubular adenoma of colon     Patient Active Problem List   Diagnosis Date Noted   Pressure injury of skin 03/09/2019   Abdominal distention    Volume overload 12/28/2018   Sepsis ([place]) 09/12/2018   Atherosclerosis of native coronary artery of native heart without angina pectoris 03/11/2018   Benign neoplasm of cecum    Benign neoplasm of ascending colon    Benign neoplasm of descending colon    Benign neoplasm of rectum    Paroxysmal atrial fibrillation ([place]) 01/23/2018   Hx of colonic polyps 01/20/2018   ESRD (end stage renal disease) ([place]) 11/21/2017   GERD (gastroesophageal reflux disease) 11/16/2017   Liver cirrhosis ([place]) 11/15/2017   DNR (do not resuscitate)    Palliative care by specialist    Hyponatremia 11/04/2017   SBP (spontaneous bacterial peritonitis) ([place]) 10/30/2017   Liver disease, chronic 10/30/2017   SOB (shortness of  breath)    Abdominal pain 10/28/2017   Upper airway cough syndrome with flattening on f/v loop 10/13/17 c/w vcd 10/17/2017   Elevated diaphragm 10/13/2017   Ileus ([place]) 09/29/2017   Malnutrition of moderate degree 09/29/2017   Sinus congestion 09/03/2017   Symptomatic anemia 09/02/2017   Other cirrhosis of liver ([place]) 09/02/2017   Left bundle branch block 09/02/2017   Mitral stenosis 09/02/2017   Hematochezia 07/15/2017   Wide-complex tachycardia ([place])    Endotracheally intubated    ESRD on dialysis ([place]) 07/04/2017    Acute respiratory failure with hypoxia ([place]) 06/18/2017   CKD (chronic kidney disease) stage V requiring chronic dialysis ([place]) 06/18/2017   History of Cocaine abuse ([place]) 06/18/2017   Hypertension 06/18/2017   Infection of AV graft for dialysis ([place]) 06/18/2017   Anxiety 06/18/2017   Anemia due to chronic kidney disease 06/18/2017   Atrial flutter with rapid ventricular response ([place]) 06/18/2017   Personality disorder ([place]) 06/13/2017   Cellulitis 06/12/2017   Adjustment disorder with mixed anxiety and depressed mood 06/10/2017   Suicidal ideation 06/10/2017   Arm wound, left, sequela 06/10/2017   Dyspnea on exertion 05/29/2017   Tachycardia 05/29/2017   Hyperkalemia 45/80/9983   Acute metabolic encephalopathy    Anemia 04/23/2017   Ascites 04/23/2017   COPD (chronic obstructive pulmonary disease) ([place]) 04/23/2017   Acute on chronic respiratory failure with hypoxia ([place]) 03/25/2017   Arrhythmia 03/25/2017   COPD GOLD 0 with flattening on inps f/v  09/27/2016   Essential hypertension 09/27/2016   Fluid overload 08/30/2016   COPD exacerbation ([place]) 08/17/2016   Hypertensive urgency 08/17/2016   Respiratory failure ([place]) 08/17/2016   Problem with dialysis access ([place]) 07/23/2016   Chronic hepatitis B ([place]) 03/05/2014   Chronic hepatitis C without hepatic coma ([place]) 03/05/2014   Internal hemorrhoids with bleeding, swelling and itching 03/05/2014   Thrombocytopenia ([place]) 03/05/2014   Chest pain 02/27/2014   Alcohol abuse 04/14/2009   Cigarette smoker 04/14/2009   GANGLION CYST 04/14/2009    Past Surgical History:  Procedure Laterality Date   A/V FISTULAGRAM Left 05/26/2017   Procedure: A/V FISTULAGRAM;  Surgeon: Conrad , MD;  Location: [place] [facility];  Service: Cardiovascular;  Laterality: Left;   A/V FISTULAGRAM Right 11/18/2017   Procedure: A/V FISTULAGRAM - Right Arm;  Surgeon: Elam Dutch, MD;  Location: [place] [facility];  Service: Cardiovascular;  Laterality: Right;   APPLICATION OF WOUND VAC Left 06/14/2017   Procedure: APPLICATION OF WOUND VAC;  Surgeon: Katha Cabal, MD;  Location: [facility] ORS;  Service: Vascular;  Laterality: Left;   AV FISTULA PLACEMENT  2012   BELIEVED WAS PLACED IN JUNE   AV FISTULA PLACEMENT Right 08/09/2017   Procedure: Creation Right arm ARTERIOVENOUS BRACHIOCEPOHALIC FISTULA;  Surgeon: Elam Dutch, MD;  Location: [facility] OR;  Service: Vascular;  Laterality: Right;   AV FISTULA PLACEMENT Right 11/22/2017   Procedure: INSERTION OF ARTERIOVENOUS (AV) GORE-TEX GRAFT RIGHT UPPER ARM;  Surgeon: Elam Dutch, MD;  Location: [place];  Service: Vascular;  Laterality: Right;   BIOPSY  01/25/2018   Procedure: BIOPSY;  Surgeon: Jerene Bears, MD;  Location: [place];  Service: Gastroenterology;;   COLONOSCOPY     COLONOSCOPY WITH PROPOFOL N/A 01/25/2018   Procedure: COLONOSCOPY WITH PROPOFOL;  Surgeon: Jerene Bears, MD;  Location: [place];  Service: Gastroenterology;  Laterality: N/A;   CORONARY STENT INTERVENTION N/A 02/22/2017   Procedure: CORONARY STENT INTERVENTION;  Surgeon: Vernell Leep  J, MD;  Location: [place] [facility];  Service: Cardiovascular;  Laterality: N/A;   ESOPHAGOGASTRODUODENOSCOPY (EGD) WITH PROPOFOL N/A 01/25/2018   Procedure: ESOPHAGOGASTRODUODENOSCOPY (EGD) WITH PROPOFOL;  Surgeon: Jerene Bears, MD;  Location: [place];  Service: Gastroenterology;  Laterality: N/A;   FLEXIBLE SIGMOIDOSCOPY N/A 07/15/2017   Procedure: FLEXIBLE SIGMOIDOSCOPY;  Surgeon: Carol Ada, MD;  Location: [place];  Service: Endoscopy;  Laterality: N/A;   HEMORRHOID BANDING     I&D EXTREMITY Left 06/01/2017   Procedure: IRRIGATION AND DEBRIDEMENT LEFT ARM HEMATOMA WITH LIGATION OF LEFT ARM AV FISTULA;  Surgeon: Elam Dutch, MD;  Location: [place];  Service: Vascular;  Laterality: Left;   I&D EXTREMITY Left 06/14/2017   Procedure: IRRIGATION AND  DEBRIDEMENT EXTREMITY;  Surgeon: Katha Cabal, MD;  Location: [facility] ORS;  Service: Vascular;  Laterality: Left;   INSERTION OF DIALYSIS CATHETER  05/30/2017   INSERTION OF DIALYSIS CATHETER N/A 05/30/2017   Procedure: INSERTION OF DIALYSIS CATHETER;  Surgeon: Elam Dutch, MD;  [facility]: [facility];  Service: Vascular;  Laterality: N/A;   IR PARACENTESIS  08/30/2017   IR PARACENTESIS  09/29/2017   IR PARACENTESIS  10/28/2017   IR PARACENTESIS  11/09/2017   IR PARACENTESIS  11/16/2017   IR PARACENTESIS  11/28/2017   IR PARACENTESIS  12/01/2017   IR PARACENTESIS  12/06/2017   IR PARACENTESIS  01/03/2018   IR PARACENTESIS  01/23/2018   IR PARACENTESIS  02/07/2018   IR PARACENTESIS  02/21/2018   IR PARACENTESIS  03/06/2018   IR PARACENTESIS  03/17/2018   IR PARACENTESIS  04/04/2018   IR PARACENTESIS  12/28/2018   IR PARACENTESIS  01/08/2019   IR PARACENTESIS  01/23/2019   IR PARACENTESIS  02/01/2019   IR PARACENTESIS  02/19/2019   IR PARACENTESIS  03/01/2019   IR PARACENTESIS  03/15/2019   IR RADIOLOGIST EVAL & MGMT  02/14/2018   IR RADIOLOGIST EVAL & MGMT  02/22/2019   LEFT HEART CATH AND CORONARY ANGIOGRAPHY N/A 02/22/2017   Procedure: LEFT HEART CATH AND CORONARY ANGIOGRAPHY;  Surgeon: Nigel Mormon, MD;  Location: [place] [facility];  Service: Cardiovascular;  Laterality: N/A;   LIGATION OF ARTERIOVENOUS  FISTULA Left 07/14/7515   Procedure: Plication of Left Arm Arteriovenous Fistula;  Surgeon: Elam Dutch, MD;  Location: [facility] OR;  Service: Vascular;  Laterality: Left;   POLYPECTOMY     POLYPECTOMY  01/25/2018   Procedure: POLYPECTOMY;  Surgeon: Jerene Bears, MD;  Location: [place];  Service: Gastroenterology;;   REVISON OF ARTERIOVENOUS FISTULA Left 0/07/7492   Procedure: PLICATION OF DISTAL ANEURYSMAL SEGEMENT OF LEFT UPPER ARM ARTERIOVENOUS FISTULA;  Surgeon: Elam Dutch, MD;  Location: [place];  Service: Vascular;  Laterality: Left;   REVISON OF  ARTERIOVENOUS FISTULA Left 4/96/7591   Procedure: Plication of Left Upper Arm Fistula ;  Surgeon: Waynetta Sandy, MD;  Location: [place];  Service: Vascular;  Laterality: Left;   SKIN GRAFT SPLIT THICKNESS LEG / FOOT Left    SKIN GRAFT SPLIT THICKNESS LEFT ARM DONOR SITE: LEFT ANTERIOR THIGH   SKIN SPLIT GRAFT Left 07/04/2017   Procedure: SKIN GRAFT SPLIT THICKNESS LEFT ARM DONOR SITE: LEFT ANTERIOR THIGH;  Surgeon: Elam Dutch, MD;  Location: [place];  Service: Vascular;  Laterality: Left;   THROMBECTOMY W/ EMBOLECTOMY Left 06/05/2017   Procedure: EXPLORATION OF LEFT ARM FOR BLEEDING; OVERSEWED PROXIMAL FISTULA;  Surgeon: Angelia Mould, MD;  Location: [place];  Service: Vascular;  Laterality:  Left;   WOUND EXPLORATION Left 06/03/2017   Procedure: WOUND EXPLORATION WITH WOUND VAC APPLICATION TO LEFT ARM;  Surgeon: Angelia Mould, MD;  Location: [place];  Service: Vascular;  Laterality: Left;      Home Medications    Prior to Admission medications   Medication Sig Start Date End Date Taking? Authorizing Provider  albuterol (VENTOLIN HFA) 108 (90 Base) MCG/ACT inhaler Inhale 2 puffs into the lungs every 6 (six) hours as needed for wheezing or shortness of breath.    [provider]  hydrALAZINE (APRESOLINE) 100 MG tablet Take 100 mg by mouth daily. 02/13/19   [provider]  hydrOXYzine (ATARAX/VISTARIL) 25 MG tablet Take 25 mg by mouth 2 (two) times daily as needed. 02/14/19   [provider]  lactulose (CHRONULAC) 10 GM/15ML solution TAKE 45 MLS BY MOUTH 2 TIMES DAILY. Patient taking differently: Take 45 g by mouth 2 (two) times daily.  01/30/19   Pyrtle, Lajuan Lines, MD  metoprolol tartrate (LOPRESSOR) 25 MG tablet Take 1 tablet (25 mg total) by mouth 2 (two) times daily. 03/11/18   Geradine Girt, DO  montelukast (SINGULAIR) 10 MG tablet Take 10 mg by mouth daily.    [provider]  ondansetron (ZOFRAN) 8 MG tablet Take 8 mg by mouth  every 8 (eight) hours as needed for nausea or vomiting.     [provider]  pantoprazole (PROTONIX) 40 MG tablet Take 1 tablet (40 mg total) by mouth See admin instructions. Take one tablet (40 mg) by mouth in the morning on Sunday, Tuesday, Thursday, Saturday, take one tablet (40 mg) after dialysis on Monday, Wednesday, Friday. May also take one tablet (40 mg) in the evening as needed for acid reflux. 12/26/18   Esterwood, Amy S, PA-C  sevelamer carbonate (RENVELA) 800 MG tablet Take 4 tablets with meals  And 2 tablets twice daily with snacks Patient taking differently: Take 1,600-3,200 mg by mouth See admin instructions. Take 3200 mg three time a day with meals, and 1600 mg twice daily with snacks 01/22/18   Patrecia Pour, MD  sulfamethoxazole-trimethoprim (BACTRIM DS) 800-160 MG tablet Take 1 tablet by mouth daily. 12/30/18   Geradine Girt, DO  SYMBICORT 160-4.5 MCG/ACT inhaler Inhale 2 puffs into the lungs 2 (two) times a day. 11/30/18   [provider]  zolpidem (AMBIEN) 5 MG tablet Take 5 mg by mouth at bedtime. 12/08/18   [provider]    Family History Family History  Problem Relation Age of Onset   Heart disease Mother    Lung cancer Mother    Heart disease Father    Malignant hyperthermia Father    COPD Father    Throat cancer Sister    Esophageal cancer Sister    Hypertension Other    COPD Other    Colon cancer Neg Hx    Colon polyps Neg Hx    Rectal cancer Neg Hx    Stomach cancer Neg Hx     Social History Social History   Tobacco Use   Smoking status: Current Every Day Smoker    Packs/day: 0.50    Years: 43.00    Pack years: 21.50    Types: Cigarettes    Start date: 08/13/1973   Smokeless tobacco: Never Used  Substance Use Topics   Alcohol use: Not Currently    Frequency: Never    Comment: quit drinking in 2017   Drug use: Yes    Types: Marijuana, Cocaine  Comment: reports using once every 3 months     Allergies    Morphine and related, Aspirin, Clonidine derivatives, Tramadol, and Tylenol [acetaminophen]   Review of Systems Review of Systems Ten systems are reviewed and are negative for acute change except as noted in the HPI   Physical Exam Updated Vital Signs BP 138/65    Pulse 78    Temp 98.2 F (36.8 C) (Oral)    Resp 20    Ht 5\' 10"  (1.778 m)    Wt 67.4 kg    SpO2 99%    BMI 21.32 kg/m   Physical Exam Constitutional:      General: He is not in acute distress.    Appearance: Normal appearance. He is well-developed. He is not ill-appearing or diaphoretic.  HENT:     Head: Normocephalic and atraumatic.     Right Ear: External ear normal.     Left Ear: External ear normal.     Nose: Nose normal.  Eyes:     General: Vision grossly intact. Gaze aligned appropriately.     Pupils: Pupils are equal, round, and reactive to light.  Neck:     Musculoskeletal: Normal range of motion.     Trachea: Trachea and phonation normal. No tracheal deviation.  Cardiovascular:     Rate and Rhythm: Normal rate and regular rhythm.  Pulmonary:     Effort: Pulmonary effort is normal. No accessory muscle usage or respiratory distress.     Breath sounds: Normal breath sounds and air entry.  Abdominal:     General: There is no distension.     Palpations: Abdomen is soft.     Tenderness: There is no abdominal tenderness. There is no guarding or rebound.  Musculoskeletal: Normal range of motion.  Skin:    General: Skin is warm and dry.  Neurological:     Mental Status: He is alert.     GCS: GCS eye subscore is 4. GCS verbal subscore is 5. GCS motor subscore is 6.     Comments: Speech is clear and goal oriented, follows commands Major Cranial nerves without deficit, no facial droop Moves extremities without ataxia, coordination intact  Psychiatric:        Behavior: Behavior normal.     ED Treatments / Results  Labs (all labs ordered are listed, but only abnormal results are displayed) Labs Reviewed    CBC WITH DIFFERENTIAL/PLATELET - Abnormal; Notable for the following components:      Result Value   RBC 3.90 (*)    Hemoglobin 11.5 (*)    HCT 32.9 (*)    Abs Immature Granulocytes 0.14 (*)    All other components within normal limits  COMPREHENSIVE METABOLIC PANEL - Abnormal; Notable for the following components:   Potassium 5.4 (*)    Chloride 97 (*)    Glucose, Bld 104 (*)    BUN 46 (*)    Creatinine, Ser 9.76 (*)    Calcium 8.6 (*)    Total Protein 5.8 (*)    Albumin 3.1 (*)    GFR calc non Af Amer 5 (*)    GFR calc Af Amer 6 (*)    All other components within normal limits  LIPASE, BLOOD    EKG EKG Interpretation  Date/Time:  Monday March 19 2019 09:52:16 EDT Ventricular Rate:  95 PR Interval:    QRS Duration: 174 QT Interval:  457 QTC Calculation: 575 R Axis:   -71 Text Interpretation:  Atrial flutter with predominant 3:1  AV block Paired ventricular premature complexes Left bundle branch block No significant change since last tracing Confirmed by Blanchie Dessert [phone]) on 03/19/2019 9:56:26 AM   Radiology No results found.  Procedures Procedures (including critical care time)  Medications Ordered in ED Medications  fentaNYL (SUBLIMAZE) injection 25 mcg (25 mcg Intravenous Given 03/19/19 0954)  metoprolol tartrate (LOPRESSOR) injection 2.5 mg (2.5 mg Intravenous Given 03/19/19 1131)     Initial Impression / Assessment and Plan / ED Course  I have reviewed the triage vital signs and the nursing notes.  Pertinent labs & imaging results that were available during my care of the patient were reviewed by me and considered in my medical decision making (see chart for details).    Patient with what appears to be resolving diarrhea and lower abdominal cramping after eating a sandwich 4 days ago.  On initial evaluation he is overall very well-appearing and in no acute distress.  Heart regular rate and rhythm, lungs clear to auscultation, abdomen is soft nontender  and without peritoneal signs.  He is without history of fever, doubt SBP or other acute intra-abdominal pathologies at this time.  Will obtain CBC, CMP and lipase, unable to obtain urinalysis as patient is anuric. - CBC nonacute Lipase within normal limits CMP with potassium 5.4, creatinine 9.76 consistent with patient's ESRD, appears baseline EKG:  Atrial flutter with predominant 3:1 AV block Paired ventricular premature complexes Left bundle branch block No significant change since last tracing Confirmed by Blanchie Dessert [phone]) on 03/19/2019 9:56:26 AM - On reassessment patient sleeping, easily arousable to voice.  States he is feeling well at this time he is requesting additional fentanyl.  On reassessment of the abdomen is soft, nontender without peritoneal signs.  Vital signs stable.  Discussed plan of care with patient, he understands need for dialysis today he states that if he has discharged soon he will be able to follow-up with his dialysis clinic to have his normal dialysis today.  Discussed care plan with Dr. Theora Gianotti who agrees. - Unfortunately dialysis coordinator is not available today.  Case management was consulted and they have contacted patient's dialysis facility, they have room for patient early tomorrow morning for dialysis.  Case management to provide patient with taxi [place] for transportation home as well as to dialysis facility tomorrow morning.  Patient reassessed he is resting comfortably and in no acute distress he is requesting food today and discharge.  He has been informed of care plan and is agreeable to discharge at this time and to go to his dialysis center tomorrow morning for treatment.  I have offered patient testing for possible C. difficile and asked him to provide a stool sample today he has refused to provide stool sample and would like to be discharged with some food.  Do not feel patient's diarrhea today likely to be infectious he has no recent antibiotic  usage, do not feel patient assisted to him.  Antibiotic treatment for his diarrhea today.  Patient to be discharged with outpatient follow-up and dialysis treatment tomorrow morning.  Case and care plan discussed with Dr. Theora Gianotti who agrees with above. - Patient has been given [place] sandwich here today.  According to nursing staff patient has finished his entire sandwich and is now requesting a "hot meal" he has not had any nausea vomiting on reassessment overall well-appearing and in no acute distress.  Patient appears safe for discharge to dialysis center tomorrow morning.  Discussed with choices with the patient in increasing his  water intake to avoid dehydration.  At this time there does not appear to be any evidence of an acute emergency medical condition and the patient appears stable for discharge with appropriate outpatient follow up. Diagnosis was discussed with patient who verbalizes understanding of care plan and is agreeable to discharge. I have discussed return precautions with patient who verbalizes understanding of return precautions. Patient encouraged to follow-up with their PCP and dialysis tomorrow. All questions answered.  Patient has been discharged in good condition.  Patient's case rediscussed with Dr. Maryan Rued who agrees with plan to discharge with follow-up.   Note: Portions of this report may have been transcribed using voice recognition software. Every effort was made to ensure accuracy; however, inadvertent computerized transcription errors may still be present. Final Clinical Impressions(s) / ED Diagnoses   Final diagnoses:  Lower abdominal pain  Diarrhea, unspecified type    ED Discharge Orders    None       Gari Crown 03/19/19 1249    Blanchie Dessert, MD 03/19/19 2109

## 2019-03-19 NOTE — ED Notes (Signed)
Patient wheeled outside and [employer] called. Patient stating to RN he needs help with transportation to dialysis center in the morning. Social work called and informs will contact dialysis coordinator for further help.

## 2019-03-19 NOTE — ED Triage Notes (Signed)
Per [facility] pt coming from home c/o abdominal pain and diarrhea since he was seen here on Thursday. Patient due for dialysis today. Patient refusing to put gown on. Does not make urine.

## 2019-03-19 NOTE — TOC Initial Note (Addendum)
Transition of Care [facility]) - Initial/Assessment Note    Patient Details  Name: [name] MRN: [id number] Date of Birth: 03/29/1961  Transition of Care [facility] CM/SW Contact:    Erenest Rasher, RN Phone Number:[phone] 03/19/2019, 12:04 PM  Clinical Narrative:                 Spoke to pt and he goes to [place] for HD on MWF. He missed his treatment time for today. He uses Medicaid Transportation to his HD treatment. TOC CM contacted dialysis center, [phone] to schedule him for a work in appt today. Dialysis center unable to scheduled for today. Pt will need to call early in the am for an appt time tomorrow. Will provide information to pt and place on his dc instructions. Message sent to Dialysis Navigator, Terri Piedra to follow up with center and transportation issues.   Expected Discharge Plan: Home/Self Care Barriers to Discharge: No Barriers Identified   Patient Goals and CMS Choice        Expected Discharge Plan and Services Expected Discharge Plan: Home/Self Care In-house Referral: Clinical Social Work Discharge Planning Services: CM Consult, Follow-up appt scheduled   Living arrangements for the past 2 months: Apartment                                      Prior Living Arrangements/Services Living arrangements for the past 2 months: Apartment Lives with:: Self Patient language and need for interpreter reviewed:: Yes Do you feel safe going back to the place where you live?: Yes      Need for Family Participation in Patient Care: No (Comment) Care giver support system in place?: No (comment)   Criminal Activity/Legal Involvement Pertinent to Current Situation/Hospitalization: No - Comment as needed  Activities of Daily Living      Permission Sought/Granted Permission sought to share information with : Case Manager, Family Supports Permission granted to share information with : Yes, Verbal Permission Granted               Emotional Assessment       Orientation: : Oriented to Self, Oriented to Place, Oriented to  Time, Oriented to Situation   Psych Involvement: No (comment)  Admission diagnosis:  abd pain,dialysis pt Patient Active Problem List   Diagnosis Date Noted  . Pressure injury of skin 03/09/2019  . Abdominal distention   . Volume overload 12/28/2018  . Sepsis ([place]) 09/12/2018  . Atherosclerosis of native coronary artery of native heart without angina pectoris 03/11/2018  . Benign neoplasm of cecum   . Benign neoplasm of ascending colon   . Benign neoplasm of descending colon   . Benign neoplasm of rectum   . Paroxysmal atrial fibrillation ([place]) 01/23/2018  . Hx of colonic polyps 01/20/2018  . ESRD (end stage renal disease) ([place]) 11/21/2017  . GERD (gastroesophageal reflux disease) 11/16/2017  . Liver cirrhosis ([place]) 11/15/2017  . DNR (do not resuscitate)   . Palliative care by specialist   . Hyponatremia 11/04/2017  . SBP (spontaneous bacterial peritonitis) ([place]) 10/30/2017  . Liver disease, chronic 10/30/2017  . SOB (shortness of breath)   . Abdominal pain 10/28/2017  . Upper airway cough syndrome with flattening on f/v loop 10/13/17 c/w vcd 10/17/2017  . Elevated diaphragm 10/13/2017  . Ileus ([place]) 09/29/2017  . Malnutrition of moderate degree 09/29/2017  . Sinus congestion 09/03/2017  .  Symptomatic anemia 09/02/2017  . Other cirrhosis of liver ([place]) 09/02/2017  . Left bundle branch block 09/02/2017  . Mitral stenosis 09/02/2017  . Hematochezia 07/15/2017  . Wide-complex tachycardia ([place])   . Endotracheally intubated   . ESRD on dialysis ([place]) 07/04/2017  . Acute respiratory failure with hypoxia ([place]) 06/18/2017  . CKD (chronic kidney disease) stage V requiring chronic dialysis ([place]) 06/18/2017  . History of Cocaine abuse ([place]) 06/18/2017  . Hypertension 06/18/2017  . Infection of AV graft for dialysis ([place]) 06/18/2017  . Anxiety 06/18/2017  . Anemia due to chronic kidney  disease 06/18/2017  . Atrial flutter with rapid ventricular response ([place]) 06/18/2017  . Personality disorder ([place]) 06/13/2017  . Cellulitis 06/12/2017  . Adjustment disorder with mixed anxiety and depressed mood 06/10/2017  . Suicidal ideation 06/10/2017  . Arm wound, left, sequela 06/10/2017  . Dyspnea on exertion 05/29/2017  . Tachycardia 05/29/2017  . Hyperkalemia 05/22/2017  . Acute metabolic encephalopathy   . Anemia 04/23/2017  . Ascites 04/23/2017  . COPD (chronic obstructive pulmonary disease) ([place]) 04/23/2017  . Acute on chronic respiratory failure with hypoxia ([place]) 03/25/2017  . Arrhythmia 03/25/2017  . COPD GOLD 0 with flattening on inps f/v  09/27/2016  . Essential hypertension 09/27/2016  . Fluid overload 08/30/2016  . COPD exacerbation ([place]) 08/17/2016  . Hypertensive urgency 08/17/2016  . Respiratory failure ([place]) 08/17/2016  . Problem with dialysis access ([place]) 07/23/2016  . Chronic hepatitis B ([place]) 03/05/2014  . Chronic hepatitis C without hepatic coma ([place]) 03/05/2014  . Internal hemorrhoids with bleeding, swelling and itching 03/05/2014  . Thrombocytopenia ([place]) 03/05/2014  . Chest pain 02/27/2014  . Alcohol abuse 04/14/2009  . Cigarette smoker 04/14/2009  . GANGLION CYST 04/14/2009   PCP:  [name], FNP Pharmacy:   [place], [place] - [address] [place] [address] Phone: [phone] Fax: [phone]  [id number] - [place], [place] [address] [place] [address] Phone: [phone] Fax: [phone]     Social Determinants of Health (SDOH) Interventions    Readmission Risk Interventions No flowsheet data found.

## 2019-03-19 NOTE — Discharge Instructions (Addendum)
You have been diagnosed today with abdominal pain and diarrhea.  At this time there does not appear to be the presence of an emergent medical condition, however there is always the potential for conditions to change. Please read and follow the below instructions.  Please return to the Emergency Department immediately for any new or worsening symptoms. Please be sure to follow up with your Primary Care Provider within one week regarding your visit today; please call their office to schedule an appointment even if you are feeling better for a follow-up visit. Please go to your dialysis center early tomorrow morning as instructed for dialysis.  You may use the taxi vouchers given to you today to help with transportation. Please drink plenty of water to avoid dehydration, eat bland foods to avoid irritation of your stomach.  Please call your primary care provider today to establish a follow-up appointment.  Get help right away if: Your pain does not go away as soon as your doctor says it should. You cannot stop throwing up. Your pain is only in areas of your belly, such as the right side or the left lower part of the belly. You have bloody or black poop, or poop that looks like tar. You have very bad pain, cramping, or bloating in your belly. You have signs of not having enough fluid or water in your body (dehydration), such as: Dark pee, very little pee, or no pee. Cracked lips. Dry mouth. Sunken eyes. Sleepiness. Weakness. You have chest pain. You feel very weak or you pass out (faint). You have bloody or black poop or poop that looks like tar. You have very bad pain, cramping, or bloating in your belly (abdomen). You have trouble breathing or you are breathing very quickly. Your heart is beating very quickly. Your skin feels cold and clammy. You feel confused. You have any new/concerning or worsening symptoms  Please read the additional information packets attached to your discharge  summary.

## 2019-03-20 ENCOUNTER — Emergency Department (HOSPITAL_COMMUNITY)
Admission: EM | Admit: 2019-03-20 | Discharge: 2019-03-20 | Disposition: A | Discharge status: Home / Self Care | Payer: Medicare Other | Attending: Emergency Medicine | Admitting: Emergency Medicine

## 2019-03-20 ENCOUNTER — Telehealth: Payer: Self-pay | Admitting: Adult Health Nurse Practitioner

## 2019-03-20 DIAGNOSIS — Z955 Presence of coronary angioplasty implant and graft: Secondary | ICD-10-CM | POA: Diagnosis not present

## 2019-03-20 DIAGNOSIS — R1084 Generalized abdominal pain: Secondary | ICD-10-CM

## 2019-03-20 DIAGNOSIS — D124 Benign neoplasm of descending colon: Secondary | ICD-10-CM | POA: Insufficient documentation

## 2019-03-20 DIAGNOSIS — E875 Hyperkalemia: Secondary | ICD-10-CM | POA: Diagnosis not present

## 2019-03-20 DIAGNOSIS — F17210 Nicotine dependence, cigarettes, uncomplicated: Secondary | ICD-10-CM | POA: Insufficient documentation

## 2019-03-20 DIAGNOSIS — R140 Abdominal distension (gaseous): Secondary | ICD-10-CM | POA: Insufficient documentation

## 2019-03-20 DIAGNOSIS — Z79899 Other long term (current) drug therapy: Secondary | ICD-10-CM | POA: Insufficient documentation

## 2019-03-20 DIAGNOSIS — D122 Benign neoplasm of ascending colon: Secondary | ICD-10-CM | POA: Diagnosis not present

## 2019-03-20 DIAGNOSIS — R197 Diarrhea, unspecified: Secondary | ICD-10-CM

## 2019-03-20 DIAGNOSIS — Z992 Dependence on renal dialysis: Secondary | ICD-10-CM | POA: Insufficient documentation

## 2019-03-20 DIAGNOSIS — J449 Chronic obstructive pulmonary disease, unspecified: Secondary | ICD-10-CM | POA: Diagnosis not present

## 2019-03-20 DIAGNOSIS — N186 End stage renal disease: Secondary | ICD-10-CM | POA: Diagnosis not present

## 2019-03-20 DIAGNOSIS — D128 Benign neoplasm of rectum: Secondary | ICD-10-CM | POA: Insufficient documentation

## 2019-03-20 DIAGNOSIS — R000 Tachycardia, unspecified: Secondary | ICD-10-CM | POA: Diagnosis not present

## 2019-03-20 DIAGNOSIS — R109 Unspecified abdominal pain: Secondary | ICD-10-CM | POA: Diagnosis present

## 2019-03-20 DIAGNOSIS — I120 Hypertensive chronic kidney disease with stage 5 chronic kidney disease or end stage renal disease: Secondary | ICD-10-CM | POA: Insufficient documentation

## 2019-03-20 DIAGNOSIS — I2510 Atherosclerotic heart disease of native coronary artery without angina pectoris: Secondary | ICD-10-CM | POA: Insufficient documentation

## 2019-03-20 DIAGNOSIS — R1030 Lower abdominal pain, unspecified: Secondary | ICD-10-CM | POA: Diagnosis not present

## 2019-03-20 LAB — GASTROINTESTINAL PANEL BY PCR, STOOL (REPLACES STOOL CULTURE)

## 2019-03-20 LAB — CBC WITH DIFFERENTIAL/PLATELET
Abs Immature Granulocytes: 0.04 10*3/uL (ref 0.00–0.07)
Basophils Absolute: 0.1 10*3/uL (ref 0.0–0.1)
Basophils Relative: 1 %
Eosinophils Absolute: 0.1 10*3/uL (ref 0.0–0.5)
Eosinophils Relative: 1 %
HCT: 32.8 % — ABNORMAL LOW (ref 39.0–52.0)
Hemoglobin: 11.8 g/dL — ABNORMAL LOW (ref 13.0–17.0)
Immature Granulocytes: 0 %
Lymphocytes Relative: 11 %
Lymphs Abs: 1.1 10*3/uL (ref 0.7–4.0)
MCH: 29.9 pg (ref 26.0–34.0)
MCHC: 36 g/dL (ref 30.0–36.0)
MCV: 83.2 fL (ref 80.0–100.0)
Monocytes Absolute: 0.7 10*3/uL (ref 0.1–1.0)
Monocytes Relative: 7 %
Neutro Abs: 7.7 10*3/uL (ref 1.7–7.7)
Neutrophils Relative %: 80 %
Platelets: 200 10*3/uL (ref 150–400)
RBC: 3.94 MIL/uL — ABNORMAL LOW (ref 4.22–5.81)
RDW: 13.6 % (ref 11.5–15.5)
WBC: 9.8 10*3/uL (ref 4.0–10.5)
nRBC: 0 % (ref 0.0–0.2)

## 2019-03-20 LAB — ETHANOL: Alcohol, Ethyl (B): <10 mg/dL (ref <10)

## 2019-03-20 LAB — BASIC METABOLIC PANEL
Anion gap: 14 (ref 5–15)
BUN: 55 mg/dL — ABNORMAL HIGH (ref 6–20)
CO2: 24 mmol/L (ref 22–32)
Calcium: 9 mg/dL (ref 8.9–10.3)
Chloride: 98 mmol/L (ref 98–111)
Creatinine, Ser: 10.33 mg/dL — ABNORMAL HIGH (ref 0.61–1.24)
GFR calc Af Amer: 6 mL/min — ABNORMAL LOW (ref >60)
GFR calc non Af Amer: 5 mL/min — ABNORMAL LOW (ref >60)
Glucose, Bld: 103 mg/dL — ABNORMAL HIGH (ref 70–99)
Potassium: 5.8 mmol/L — ABNORMAL HIGH (ref 3.5–5.1)
Sodium: 136 mmol/L (ref 135–145)

## 2019-03-20 MED ORDER — HYDROCODONE-ACETAMINOPHEN 5-325 MG PO TABS
1.0000 | ORAL_TABLET | Freq: Once | ORAL | Status: AC
  Administered 2019-03-20: 1 via ORAL
  Filled 2019-03-20: qty 1

## 2019-03-20 MED ORDER — KETOROLAC TROMETHAMINE 30 MG/ML IJ SOLN
30.0000 mg | Freq: Once | INTRAMUSCULAR | Status: AC
  Administered 2019-03-20: 30 mg via INTRAMUSCULAR
  Filled 2019-03-20: qty 1

## 2019-03-20 MED ORDER — SODIUM ZIRCONIUM CYCLOSILICATE 10 G PO PACK
10.0000 g | PACK | Freq: Once | ORAL | Status: AC
  Administered 2019-03-20: 10 g via ORAL
  Filled 2019-03-20: qty 1

## 2019-03-20 MED ORDER — HYDROCODONE-ACETAMINOPHEN 5-325 MG PO TABS: 1.0000 | ORAL_TABLET | ORAL | 0 refills | Status: DC | PRN

## 2019-03-20 NOTE — ED Provider Notes (Signed)
[facility] EMERGENCY DEPARTMENT Provider Note   CSN: [id number] Arrival date & time: 03/20/19  2876     History   Chief Complaint No chief complaint on file.   HPI [name] is a 58 y.o. male.     HPI   Patient here for evaluation of abdominal pain.  He was in the ED yesterday to be evaluated for diarrhea.  He was due to have dialysis but it could not be obtained yesterday.  He was supposed to go there this morning.  Patient has requested Dilaudid, when he talked to the triage nurse.  He states that he was hurting too much, to go to dialysis this morning as scheduled.  He also states he continues to have diarrhea "a lot."  He denies vomiting.  He requests "something good for pain."  He denies fever, chills, vomiting, dizziness, focal weakness.  He has had paracentesis, x5, within the last 4 weeks, for ascites from cirrhosis.  He has a standing order for every 2 week paracentesis, from his gastroenterologist, last 5 were ordered by other providers.  Patient continues to have lots of pain, and has not been diagnosed with spontaneous bacterial peritonitis.  There are no other known modifying factors.  There are no other known modifying factors.    Past Medical History:  Diagnosis Date  . Anemia   . Anxiety   . Arthritis    left shoulder  . Atherosclerosis of aorta ([place])   . Cardiomegaly   . Chest pain    DATE UNKNOWN, C/O PERIODICALLY  . Cocaine abuse ([place])   . COPD exacerbation ([place]) 08/17/2016  . Coronary artery disease    stent 02/22/17  . ESRD (end stage renal disease) on dialysis ([place])    "[name]; MWF" (07/04/2017)  . GERD (gastroesophageal reflux disease)    DATE UNKNOWN  . Hemorrhoids   . Hepatitis B, chronic ([place])   . Hepatitis C   . History of kidney stones   . Hyperkalemia   . Hypertension   . Kidney failure   . Metabolic bone disease    Patient denies  . Mitral stenosis   . Myocardial infarction ([place])   . Pneumonia   .  Pulmonary edema   . Solitary rectal ulcer syndrome 07/2017   at [facility] for rectal bleeding  . Tubular adenoma of colon     Patient Active Problem List   Diagnosis Date Noted  . Pressure injury of skin 03/09/2019  . Abdominal distention   . Volume overload 12/28/2018  . Sepsis ([place]) 09/12/2018  . Atherosclerosis of native coronary artery of native heart without angina pectoris 03/11/2018  . Benign neoplasm of cecum   . Benign neoplasm of ascending colon   . Benign neoplasm of descending colon   . Benign neoplasm of rectum   . Paroxysmal atrial fibrillation ([place]) 01/23/2018  . Hx of colonic polyps 01/20/2018  . ESRD (end stage renal disease) ([place]) 11/21/2017  . GERD (gastroesophageal reflux disease) 11/16/2017  . Liver cirrhosis ([place]) 11/15/2017  . DNR (do not resuscitate)   . Palliative care by specialist   . Hyponatremia 11/04/2017  . SBP (spontaneous bacterial peritonitis) ([place]) 10/30/2017  . Liver disease, chronic 10/30/2017  . SOB (shortness of breath)   . Abdominal pain 10/28/2017  . Upper airway cough syndrome with flattening on f/v loop 10/13/17 c/w vcd 10/17/2017  . Elevated diaphragm 10/13/2017  . Ileus ([place]) 09/29/2017  . Malnutrition of moderate degree 09/29/2017  .  Sinus congestion 09/03/2017  . Symptomatic anemia 09/02/2017  . Other cirrhosis of liver ([place]) 09/02/2017  . Left bundle branch block 09/02/2017  . Mitral stenosis 09/02/2017  . Hematochezia 07/15/2017  . Wide-complex tachycardia ([place])   . Endotracheally intubated   . ESRD on dialysis ([place]) 07/04/2017  . Acute respiratory failure with hypoxia ([place]) 06/18/2017  . CKD (chronic kidney disease) stage V requiring chronic dialysis ([place]) 06/18/2017  . History of Cocaine abuse ([place]) 06/18/2017  . Hypertension 06/18/2017  . Infection of AV graft for dialysis ([place]aur[place]) 06/18/2017  . Anxiety 06/18/2017  . Anemia due to chronic kidney disease 06/18/2017  . Atrial flutter with rapid ventricular response ([place])  06/18/2017  . Personality disorder ([place]) 06/13/2017  . Cellulitis 06/12/2017  . Adjustment disorder with mixed anxiety and depressed mood 06/10/2017  . Suicidal ideation 06/10/2017  . Arm wound, left, sequela 06/10/2017  . Dyspnea on exertion 05/29/2017  . Tachycardia 05/29/2017  . Hyperkalemia 05/22/2017  . Acute metabolic encephalopathy   . Anemia 04/23/2017  . Ascites 04/23/2017  . COPD (chronic obstructive pulmonary disease) ([place]) 04/23/2017  . Acute on chronic respiratory failure with hypoxia ([place]) 03/25/2017  . Arrhythmia 03/25/2017  . COPD GOLD 0 with flattening on inps f/v  09/27/2016  . Essential hypertension 09/27/2016  . Fluid overload 08/30/2016  . COPD exacerbation ([place]ley) 08/17/2016  . Hypertensive urgency 08/17/2016  . Respiratory failure ([place]) 08/17/2016  . Problem with dialysis access ([place]) 07/23/2016  . Chronic hepatitis B ([place]) 03/05/2014  . Chronic hepatitis C without hepatic coma ([place]) 03/05/2014  . Internal hemorrhoids with bleeding, swelling and itching 03/05/2014  . Thrombocytopenia ([place]) 03/05/2014  . Chest pain 02/27/2014  . Alcohol abuse 04/14/2009  . Cigarette smoker 04/14/2009  . GANGLION CYST 04/14/2009    Past Surgical History:  Procedure Laterality Date  . A/V FISTULAGRAM Left 05/26/2017   Procedure: A/V FISTULAGRAM;  Surgeon: Conrad Mooresville, MD;  Location: [facility];  Service: Cardiovascular;  Laterality: Left;  . A/V FISTULAGRAM Right 11/18/2017   Procedure: A/V FISTULAGRAM - Right Arm;  Surgeon: Elam Dutch, MD;  Location: [facility];  Service: Cardiovascular;  Laterality: Right;  . APPLICATION OF WOUND VAC Left 06/14/2017   Procedure: APPLICATION OF WOUND VAC;  Surgeon: Katha Cabal, MD;  Location: [facility];  Service: Vascular;  Laterality: Left;  . AV FISTULA PLACEMENT  2012   BELIEVED WAS PLACED IN JUNE  . AV FISTULA PLACEMENT Right 08/09/2017   Procedure: Creation Right arm ARTERIOVENOUS BRACHIOCEPOHALIC FISTULA;   Surgeon: Elam Dutch, MD;  [facility];  Service: Vascular;  Laterality: Right;  . AV FISTULA PLACEMENT Right 11/22/2017   Procedure: INSERTION OF ARTERIOVENOUS (AV) GORE-TEX GRAFT RIGHT UPPER ARM;  Surgeon: Elam Dutch, MD;  Location: [facility];  Service: Vascular;  Laterality: Right;  . BIOPSY  01/25/2018   Procedure: BIOPSY;  Surgeon: Jerene Bears, MD;  Location: [facility];  Service: Gastroenterology;;  . COLONOSCOPY    . COLONOSCOPY WITH PROPOFOL N/A 01/25/2018   Procedure: COLONOSCOPY WITH PROPOFOL;  Surgeon: Jerene Bears, MD;  Location: [facility];  Service: Gastroenterology;  Laterality: N/A;  . CORONARY STENT INTERVENTION N/A 02/22/2017   Procedure: CORONARY STENT INTERVENTION;  Surgeon: Nigel Mormon, MD;  Location: [facility];  Service: Cardiovascular;  Laterality: N/A;  . ESOPHAGOGASTRODUODENOSCOPY (EGD) WITH PROPOFOL N/A 01/25/2018   Procedure: ESOPHAGOGASTRODUODENOSCOPY (EGD) WITH PROPOFOL;  Surgeon: Jerene Bears, MD;  Location: [facility];  Service:  Gastroenterology;  Laterality: N/A;  . FLEXIBLE SIGMOIDOSCOPY N/A 07/15/2017   Procedure: FLEXIBLE SIGMOIDOSCOPY;  Surgeon: Carol Ada, MD;  Location: [facility];  Service: Endoscopy;  Laterality: N/A;  . HEMORRHOID BANDING    . I&D EXTREMITY Left 06/01/2017   Procedure: IRRIGATION AND DEBRIDEMENT LEFT ARM HEMATOMA WITH LIGATION OF LEFT ARM AV FISTULA;  Surgeon: Elam Dutch, MD;  Location: [facility];  Service: Vascular;  Laterality: Left;  . I&D EXTREMITY Left 06/14/2017   Procedure: IRRIGATION AND DEBRIDEMENT EXTREMITY;  Surgeon: Katha Cabal, MD;  Location: [facility];  Service: Vascular;  Laterality: Left;  . INSERTION OF DIALYSIS CATHETER  05/30/2017  . INSERTION OF DIALYSIS CATHETER N/A 05/30/2017   Procedure: INSERTION OF DIALYSIS CATHETER;  Surgeon: Elam Dutch, MD;  Location: [facility];  Service: Vascular;  Laterality: N/A;  . IR PARACENTESIS  08/30/2017  . IR PARACENTESIS  09/29/2017   . IR PARACENTESIS  10/28/2017  . IR PARACENTESIS  11/09/2017  . IR PARACENTESIS  11/16/2017  . IR PARACENTESIS  11/28/2017  . IR PARACENTESIS  12/01/2017  . IR PARACENTESIS  12/06/2017  . IR PARACENTESIS  01/03/2018  . IR PARACENTESIS  01/23/2018  . IR PARACENTESIS  02/07/2018  . IR PARACENTESIS  02/21/2018  . IR PARACENTESIS  03/06/2018  . IR PARACENTESIS  03/17/2018  . IR PARACENTESIS  04/04/2018  . IR PARACENTESIS  12/28/2018  . IR PARACENTESIS  01/08/2019  . IR PARACENTESIS  01/23/2019  . IR PARACENTESIS  02/01/2019  . IR PARACENTESIS  02/19/2019  . IR PARACENTESIS  03/01/2019  . IR PARACENTESIS  03/15/2019  . IR RADIOLOGIST EVAL & MGMT  02/14/2018  . IR RADIOLOGIST EVAL & MGMT  02/22/2019  . LEFT HEART CATH AND CORONARY ANGIOGRAPHY N/A 02/22/2017   Procedure: LEFT HEART CATH AND CORONARY ANGIOGRAPHY;  Surgeon: Nigel Mormon, MD;  Location: [facility] CV LAB;  Service: Cardiovascular;  Laterality: N/A;  . LIGATION OF ARTERIOVENOUS  FISTULA Left 0/12/3014   Procedure: Plication of Left Arm Arteriovenous Fistula;  Surgeon: Elam Dutch, MD;  Location: [facility];  Service: Vascular;  Laterality: Left;  . POLYPECTOMY    . POLYPECTOMY  01/25/2018   Procedure: POLYPECTOMY;  Surgeon: Jerene Bears, MD;  Location: [facility];  Service: Gastroenterology;;  . REVISON OF ARTERIOVENOUS FISTULA Left 0/04/9322   Procedure: PLICATION OF DISTAL ANEURYSMAL SEGEMENT OF LEFT UPPER ARM ARTERIOVENOUS FISTULA;  Surgeon: Elam Dutch, MD;  Location: [facility];  Service: Vascular;  Laterality: Left;  . REVISON OF ARTERIOVENOUS FISTULA Left 5/57/3220   Procedure: Plication of Left Upper Arm Fistula ;  Surgeon: Waynetta Sandy, MD;  Location: [facility];  Service: Vascular;  Laterality: Left;  . SKIN GRAFT SPLIT THICKNESS LEG / FOOT Left    SKIN GRAFT SPLIT THICKNESS LEFT ARM DONOR SITE: LEFT ANTERIOR THIGH  . SKIN SPLIT GRAFT Left 07/04/2017   Procedure: SKIN GRAFT SPLIT THICKNESS LEFT ARM DONOR SITE: LEFT ANTERIOR  THIGH;  Surgeon: Elam Dutch, MD;  Location: [facility];  Service: Vascular;  Laterality: Left;  . THROMBECTOMY W/ EMBOLECTOMY Left 06/05/2017   Procedure: EXPLORATION OF LEFT ARM FOR BLEEDING; OVERSEWED PROXIMAL FISTULA;  Surgeon: Angelia Mould, MD;  Location: [facility];  Service: Vascular;  Laterality: Left;  . WOUND EXPLORATION Left 06/03/2017   Procedure: WOUND EXPLORATION WITH WOUND VAC APPLICATION TO LEFT ARM;  Surgeon: Angelia Mould, MD;  Location: [facility];  Service: Vascular;  Laterality: Left;  Home Medications    Prior to Admission medications   Medication Sig Start Date End Date Taking? Authorizing Provider  albuterol (VENTOLIN HFA) 108 (90 Base) MCG/ACT inhaler Inhale 2 puffs into the lungs every 6 (six) hours as needed for wheezing or shortness of breath.    [provider]  hydrALAZINE (APRESOLINE) 100 MG tablet Take 100 mg by mouth daily. 02/13/19   [provider]  HYDROcodone-acetaminophen (NORCO/VICODIN) 5-325 MG tablet Take 1 tablet by mouth every 4 (four) hours as needed for moderate pain. 03/20/19   Daleen Bo, MD  hydrOXYzine (ATARAX/VISTARIL) 25 MG tablet Take 25 mg by mouth 2 (two) times daily as needed. 02/14/19   [provider]  lactulose (CHRONULAC) 10 GM/15ML solution TAKE 45 MLS BY MOUTH 2 TIMES DAILY. Patient taking differently: Take 45 g by mouth 2 (two) times daily.  01/30/19   Pyrtle, Lajuan Lines, MD  metoprolol tartrate (LOPRESSOR) 25 MG tablet Take 1 tablet (25 mg total) by mouth 2 (two) times daily. 03/11/18   Geradine Girt, DO  montelukast (SINGULAIR) 10 MG tablet Take 10 mg by mouth daily.    [provider]  ondansetron (ZOFRAN) 8 MG tablet Take 8 mg by mouth every 8 (eight) hours as needed for nausea or vomiting.     [provider]  pantoprazole (PROTONIX) 40 MG tablet Take 1 tablet (40 mg total) by mouth See admin instructions. Take one tablet (40 mg) by mouth in the morning on Sunday, Tuesday,  Thursday, Saturday, take one tablet (40 mg) after dialysis on Monday, Wednesday, Friday. May also take one tablet (40 mg) in the evening as needed for acid reflux. 12/26/18   Esterwood, Amy S, PA-C  sevelamer carbonate (RENVELA) 800 MG tablet Take 4 tablets with meals  And 2 tablets twice daily with snacks Patient taking differently: Take 1,600-3,200 mg by mouth See admin instructions. Take 3200 mg three time a day with meals, and 1600 mg twice daily with snacks 01/22/18   Patrecia Pour, MD  sulfamethoxazole-trimethoprim (BACTRIM DS) 800-160 MG tablet Take 1 tablet by mouth daily. 12/30/18   Geradine Girt, DO  SYMBICORT 160-4.5 MCG/ACT inhaler Inhale 2 puffs into the lungs 2 (two) times a day. 11/30/18   [provider]  zolpidem (AMBIEN) 5 MG tablet Take 5 mg by mouth at bedtime. 12/08/18   [provider]    Family History Family History  Problem Relation Age of Onset  . Heart disease Mother   . Lung cancer Mother   . Heart disease Father   . Malignant hyperthermia Father   . COPD Father   . Throat cancer Sister   . Esophageal cancer Sister   . Hypertension Other   . COPD Other   . Colon cancer Neg Hx   . Colon polyps Neg Hx   . Rectal cancer Neg Hx   . Stomach cancer Neg Hx     Social History Social History   Tobacco Use  . Smoking status: Current Every Day Smoker    Packs/day: 0.50    Years: 43.00    Pack years: 21.50    Types: Cigarettes    Start date: 08/13/1973  . Smokeless tobacco: Never Used  Substance Use Topics  . Alcohol use: Not Currently    Frequency: Never    Comment: quit drinking in 2017  . Drug use: Yes    Types: Marijuana, Cocaine    Comment: reports using once every 3 months     Allergies  Morphine and related, Aspirin, Clonidine derivatives, Tramadol, and Tylenol [acetaminophen]   Review of Systems Review of Systems  All other systems reviewed and are negative.    Physical Exam Updated Vital Signs BP (!) 143/93 (BP  Location: Right Arm)   Pulse 100   Temp 97.7 F (36.5 C) (Oral)   Resp 16   SpO2 98%   Physical Exam Vitals signs and nursing note reviewed.  Constitutional:      General: He is not in acute distress.    Appearance: He is well-developed. He is not ill-appearing, toxic-appearing or diaphoretic.     Comments: He appears under nourished.  HENT:     Head: Normocephalic and atraumatic.     Right Ear: External ear normal.     Left Ear: External ear normal.     Mouth/Throat:     Mouth: Mucous membranes are moist.  Eyes:     Conjunctiva/sclera: Conjunctivae normal.     Pupils: Pupils are equal, round, and reactive to light.  Neck:     Musculoskeletal: Normal range of motion and neck supple.     Trachea: Phonation normal.  Cardiovascular:     Rate and Rhythm: Normal rate and regular rhythm.     Heart sounds: Normal heart sounds.  Pulmonary:     Effort: Pulmonary effort is normal.     Breath sounds: Normal breath sounds.  Abdominal:     General: There is distension.     Palpations: Abdomen is soft.     Tenderness: There is abdominal tenderness (Diffuse, with very light touch.).  Musculoskeletal: Normal range of motion.  Skin:    General: Skin is warm and dry.  Neurological:     Mental Status: He is alert and oriented to person, place, and time.     Cranial Nerves: No cranial nerve deficit.     Sensory: No sensory deficit.     Motor: No abnormal muscle tone.     Coordination: Coordination normal.  Psychiatric:        Mood and Affect: Mood normal.        Behavior: Behavior normal.        Thought Content: Thought content normal.        Judgment: Judgment normal.      ED Treatments / Results  Labs (all labs ordered are listed, but only abnormal results are displayed) Labs Reviewed  BASIC METABOLIC PANEL - Abnormal; Notable for the following components:      Result Value   Potassium 5.8 (*)    Glucose, Bld 103 (*)    BUN 55 (*)    Creatinine, Ser 10.33 (*)    GFR calc  non Af Amer 5 (*)    GFR calc Af Amer 6 (*)    All other components within normal limits  CBC WITH DIFFERENTIAL/PLATELET - Abnormal; Notable for the following components:   RBC 3.94 (*)    Hemoglobin 11.8 (*)    HCT 32.8 (*)    All other components within normal limits  GASTROINTESTINAL PANEL BY PCR, STOOL (REPLACES STOOL CULTURE)  ETHANOL    EKG None  Radiology No results found.  Procedures .Critical Care Performed by: Daleen Bo, MD Authorized by: Daleen Bo, MD   Critical care provider statement:    Critical care time (minutes):  35   Critical care start time:  03/20/2019 10:40 AM   Critical care end time:  03/20/2019 2:46 PM   Critical care time was exclusive of:  Separately billable procedures and  treating other patients   Critical care was necessary to treat or prevent imminent or life-threatening deterioration of the following conditions:  Metabolic crisis   Critical care was time spent personally by me on the following activities:  Blood draw for specimens, development of treatment plan with patient or surrogate, discussions with consultants, evaluation of patient's response to treatment, examination of patient, obtaining history from patient or surrogate, ordering and performing treatments and interventions, ordering and review of laboratory studies, pulse oximetry, re-evaluation of patient's condition, review of old charts and ordering and review of radiographic studies   (including critical care time)  Medications Ordered in ED Medications  ketorolac (TORADOL) 30 MG/ML injection 30 mg (30 mg Intramuscular Given 03/20/19 1119)  sodium zirconium cyclosilicate (LOKELMA) packet 10 g (10 g Oral Given 03/20/19 1446)  HYDROcodone-acetaminophen (NORCO/VICODIN) 5-325 MG per tablet 1 tablet (1 tablet Oral Given 03/20/19 1446)     Initial Impression / Assessment and Plan / ED Course  I have reviewed the triage vital signs and the nursing notes.  Pertinent labs & imaging  results that were available during my care of the patient were reviewed by me and considered in my medical decision making (see chart for details).  Clinical Course as of Mar 19 1445  Tue Mar 20, 2019  1300 The case was discussed with the on-call GI provider.  His primary gastroenterologist, is not currently available.  She recommends that the patient follow-up with the office for ongoing management.   [EW]  1301 Normal  Ethanol [EW]  1301 Normal except hemoglobin low  CBC with Differential(!) [EW]  1301 Normal except potassium high, glucose high, BUN high, creatinine high, GFR low  Basic metabolic panel(!) [EW]    Clinical Course User Index [EW] Daleen Bo, MD        Patient Vitals for the past 24 hrs:  BP Temp Temp src Pulse Resp SpO2  03/20/19 1439 (!) 143/93 - - 100 16 98 %  03/20/19 0913 140/84 97.7 F (36.5 C) Oral (!) 116 18 98 %    2:30 PM Reevaluation with update and discussion. After initial assessment and treatment, an updated evaluation reveals he again requested Dilaudid and I told him I would be giving him a single hydrocodone, and a prescription to last until he sees his GI doctor.  He then stated he wanted "something hot to eat."  Findings discussed with the patient and all questions were answered. Daleen Bo   Medical Decision Making: Recurrent abdominal pain with abdominal swelling and suspected mild ascites.  No indication for paracentesis at this time.  Nonspecific diarrhea being evaluated with stool culture, specimen obtained today.  There is no indication for metabolic instability need for urgent dialysis or suspected impending vascular collapse.  Mild hyperkalemia has been treated with Lokelma.  CRITICAL CARE-yes Performed by: Daleen Bo  Nursing Notes Reviewed/ Care Coordinated Applicable Imaging Reviewed Interpretation of Laboratory Data incorporated into ED treatment  The patient appears reasonably screened and/or stabilized for discharge and  I doubt any other medical condition or other [facility] requiring further screening, evaluation, or treatment in the ED at this time prior to discharge.  Plan: Home Medications-usual; Home Treatments-drink plenty of fluids; return here if the recommended treatment, does not improve the symptoms; Recommended follow up-follow-up with GI, as scheduled, call dialysis unit tomorrow for dialysis arrangements, see GI for diarrhea.   Final Clinical Impressions(s) / ED Diagnoses   Final diagnoses:  Generalized abdominal pain  Diarrhea, unspecified type  Hyperkalemia  End stage renal disease [facility])    ED Discharge Orders         Ordered    HYDROcodone-acetaminophen (NORCO/VICODIN) 5-325 MG tablet  Every 4 hours PRN     03/20/19 1444           Daleen Bo, MD 03/21/19 [phone]

## 2019-03-20 NOTE — Progress Notes (Signed)
Renal Navigator returned call to CM/A. Shavis-message left to call back as needed. Renal Navigator spoke with patient this AM to stress importance of keeping all scheduled OP HD appointments (MWF schedule).  Alphonzo Cruise, [place]  Renal Navigator [phone]

## 2019-03-20 NOTE — Telephone Encounter (Signed)
Returned patient's message left with organization on call.  Patient had been having diarrhea and abdominal cramping after eating a sandwich on 9/3. He was in the ER yesterday for his symptoms. States that the symptoms were worse and he is currently in the ER triage.  Got off the phone so he could be attended to by ER staff. Rhett Mutschler K. Olena Heckle NP

## 2019-03-20 NOTE — Progress Notes (Addendum)
TOC CM spoke to pt and states he can go to appt for GI but will need transportation. Appt arranged with Alonza Bogus PA, [facility] on 9/10 at 1030 am. [facility] and spoke to supervisor, Ms Gloriann Loan and exception made for transport. (3 day notification required). She was able to put him in to have transport on Thursday. CM explained this to patient to be ready 1 hour and 30 minutes prior to appt and they will confirm prior to appt. Explained to patient to make sure he keeps HD appt on Wednesday.    Left message for Terri Piedra, Dialysis Navigator to return call. Pt has his HD treatment on MWF. [place], [facility] TOC CM [phone]

## 2019-03-20 NOTE — Progress Notes (Signed)
Renal Navigator received message from ED CSW to contact patient to ensure that he has transportation to HD today. Transportation is often unavailable on day of appointment, however. Renal Navigator contacted OP HD clinic[facility] to find out what time patient's appointment is and was told that he does not have an appointment. Renal Navigator read ED notes that state he was supposed to call to be a "work-in," however, he cannot be worked in because he has to be run in isolation due to HepB positive status.  Renal Navigator contacted patient to discuss how important it is to keep his regularly scheduled appointment for tomorrow morning. He informed Renal Navigator that he has already contacted the ambulance and is on his way back to the hospital.   Alphonzo Cruise, [place] Renal Navigator [phone]

## 2019-03-20 NOTE — ED Triage Notes (Signed)
Pt back today after being seen here yesterday foe abd pain , pt states that the only thing that works for abd pain is dilaudid

## 2019-03-20 NOTE — Discharge Instructions (Signed)
Your potassium was high today and it was treated with an oral medication.  Make sure you are drinking plenty of water.  Go to your dialysis facility tomorrow for dialysis.  You should call them to make sure the proper time to come.  We schedule appointment for you with your GI doctor in a couple of days.  Make sure you go to that appointment.  They can help make arrangements for further care of your diarrhea and paracentesis as needed.

## 2019-03-21 DIAGNOSIS — N186 End stage renal disease: Secondary | ICD-10-CM | POA: Diagnosis not present

## 2019-03-21 DIAGNOSIS — D509 Iron deficiency anemia, unspecified: Secondary | ICD-10-CM | POA: Diagnosis not present

## 2019-03-21 DIAGNOSIS — D631 Anemia in chronic kidney disease: Secondary | ICD-10-CM | POA: Diagnosis not present

## 2019-03-21 DIAGNOSIS — N2581 Secondary hyperparathyroidism of renal origin: Secondary | ICD-10-CM | POA: Diagnosis not present

## 2019-03-21 DIAGNOSIS — Z992 Dependence on renal dialysis: Secondary | ICD-10-CM | POA: Diagnosis not present

## 2019-03-22 ENCOUNTER — Other Ambulatory Visit: Payer: Self-pay

## 2019-03-22 ENCOUNTER — Ambulatory Visit (INDEPENDENT_AMBULATORY_CARE_PROVIDER_SITE_OTHER): Payer: Medicare Other | Admitting: Gastroenterology

## 2019-03-22 ENCOUNTER — Encounter: Payer: Self-pay | Admitting: Gastroenterology

## 2019-03-22 VITALS — BP 116/58 | HR 83 | Temp 97.4°F | Ht 70.0 in | Wt 153.0 lb

## 2019-03-22 DIAGNOSIS — K7460 Unspecified cirrhosis of liver: Secondary | ICD-10-CM

## 2019-03-22 DIAGNOSIS — R188 Other ascites: Secondary | ICD-10-CM

## 2019-03-22 DIAGNOSIS — I2510 Atherosclerotic heart disease of native coronary artery without angina pectoris: Secondary | ICD-10-CM

## 2019-03-22 DIAGNOSIS — K921 Melena: Secondary | ICD-10-CM | POA: Diagnosis not present

## 2019-03-22 DIAGNOSIS — R1030 Lower abdominal pain, unspecified: Secondary | ICD-10-CM

## 2019-03-22 MED ORDER — PANTOPRAZOLE SODIUM 40 MG PO TBEC: 40.0000 mg | DELAYED_RELEASE_TABLET | Freq: Every day | ORAL | 3 refills | Status: DC

## 2019-03-22 NOTE — Progress Notes (Signed)
03/22/2019 [name] [id number] Sep 27, 1960   HISTORY OF PRESENT ILLNESS:  This is a 58 year old male who is a patient of Dr. Vena Rua.  He has a history of decompensated cirrhosis secondary to hepatitis B and C with recurrent ascites and history of SBP, end-stage renal disease on dialysis, history of atrial fibrillation, COPD, history of multiple colon polyps, GERD, prior substance abuse, who is seen for follow-up.  He is here alone today.  He was last seen in the office on 7/78/2020 by Dr. Hilarie Fredrickson.  He reports that he continues to struggle with recurrent ascites.  Is receiving LVP every 2 weeks.  Last paracentesis was on 9/3, fluid negative for SBP.  Complains of diffuse abdominal pain.  CT scan abdomen and pelvis with contrast on 8/27 showed the following:    IMPRESSION: Cirrhosis with large volume ascites again noted, stable since prior study.  Severely atrophic kidneys bilaterally with extensive renovascular calcifications.  Aortic and branch vessel atherosclerosis.  Cardiomegaly, small pericardial effusion.  He reports he is taking his lactulose twice a day and having 2-3 bowel movements per day.  He is taking Protonix 40 mg a day.  He also reports compliance with Bactrim 1 double strength tablet daily which he takes for SBP prophylaxis.  Dr. Merrilee Jansky with [facility] reviewed the patient's chart.  Per note it was felt that he was not a liver transplant candidate due to issues with adherence to dialysis regimen.  This was also discussed with [name] at [facility] who believed that patient would not be a candidate due to his severe mitral stenosis and elevated PA pressures.  TIPS procedure has been discussed, but he has an appointment with cardiology October 19 2 have them assess and see if this would be an option with his cardiac conditions.  He also reports black stools for the past week.  Hemoglobin was stable at 11.8 g at his emergency room visit 2 days ago.  He  denies any hematemesis or coffee-ground emesis.  Denied rectal exam in the office today.   Past Medical History:  Diagnosis Date  . Anemia   . Anxiety   . Arthritis    left shoulder  . Atherosclerosis of aorta ([place])   . Cardiomegaly   . Chest pain    DATE UNKNOWN, C/O PERIODICALLY  . Cocaine abuse ([place])   . COPD exacerbation ([place]) 08/17/2016  . Coronary artery disease    stent 02/22/17  . ESRD (end stage renal disease) on dialysis ([place])    "[name]; MWF" (07/04/2017)  . GERD (gastroesophageal reflux disease)    DATE UNKNOWN  . Hemorrhoids   . Hepatitis B, chronic ([place])   . Hepatitis C   . History of kidney stones   . Hyperkalemia   . Hypertension   . Kidney failure   . Metabolic bone disease    Patient denies  . Mitral stenosis   . Myocardial infarction ([place])   . Pneumonia   . Pulmonary edema   . Solitary rectal ulcer syndrome 07/2017   at flex sig for rectal bleeding  . Tubular adenoma of colon    Past Surgical History:  Procedure Laterality Date  . A/V FISTULAGRAM Left 05/26/2017   Procedure: A/V FISTULAGRAM;  Surgeon: Conrad , MD;  Location: [place] [facility];  Service: Cardiovascular;  Laterality: Left;  . A/V FISTULAGRAM Right 11/18/2017   Procedure: A/V FISTULAGRAM - Right Arm;  Surgeon: Elam Dutch, MD;  Location: [place] [facility];  Service: Cardiovascular;  Laterality: Right;  . APPLICATION OF WOUND VAC Left 06/14/2017   Procedure: APPLICATION OF WOUND VAC;  Surgeon: Katha Cabal, MD;  Location: [facility];  Service: Vascular;  Laterality: Left;  . AV FISTULA PLACEMENT  2012   BELIEVED WAS PLACED IN JUNE  . AV FISTULA PLACEMENT Right 08/09/2017   Procedure: Creation Right arm ARTERIOVENOUS BRACHIOCEPOHALIC FISTULA;  Surgeon: Elam Dutch, MD;  Location: [facility] OR;  Service: Vascular;  Laterality: Right;  . AV FISTULA PLACEMENT Right 11/22/2017   Procedure: INSERTION OF ARTERIOVENOUS (AV) GORE-TEX GRAFT RIGHT UPPER ARM;  Surgeon: Elam Dutch, MD;  Location: [place];  Service: Vascular;  Laterality: Right;  . BIOPSY  01/25/2018   Procedure: BIOPSY;  Surgeon: Jerene Bears, MD;  Location: [facility] ENDOSCOPY;  Service: Gastroenterology;;  . COLONOSCOPY    . COLONOSCOPY WITH PROPOFOL N/A 01/25/2018   Procedure: COLONOSCOPY WITH PROPOFOL;  Surgeon: Jerene Bears, MD;  Location: [place];  Service: Gastroenterology;  Laterality: N/A;  . CORONARY STENT INTERVENTION N/A 02/22/2017   Procedure: CORONARY STENT INTERVENTION;  Surgeon: Nigel Mormon, MD;  Location: [place] [facility];  Service: Cardiovascular;  Laterality: N/A;  . ESOPHAGOGASTRODUODENOSCOPY (EGD) WITH PROPOFOL N/A 01/25/2018   Procedure: ESOPHAGOGASTRODUODENOSCOPY (EGD) WITH PROPOFOL;  Surgeon: Jerene Bears, MD;  Location: [place];  Service: Gastroenterology;  Laterality: N/A;  . FLEXIBLE SIGMOIDOSCOPY N/A 07/15/2017   Procedure: FLEXIBLE SIGMOIDOSCOPY;  Surgeon: Carol Ada, MD;  Location: [place];  Service: Endoscopy;  Laterality: N/A;  . HEMORRHOID BANDING    . I&D EXTREMITY Left 06/01/2017   Procedure: IRRIGATION AND DEBRIDEMENT LEFT ARM HEMATOMA WITH LIGATION OF LEFT ARM AV FISTULA;  Surgeon: Elam Dutch, MD;  Location: [place];  Service: Vascular;  Laterality: Left;  . I&D EXTREMITY Left 06/14/2017   Procedure: IRRIGATION AND DEBRIDEMENT EXTREMITY;  Surgeon: Katha Cabal, MD;  Location: [facility];  Service: Vascular;  Laterality: Left;  . INSERTION OF DIALYSIS CATHETER  05/30/2017  . INSERTION OF DIALYSIS CATHETER N/A 05/30/2017   Procedure: INSERTION OF DIALYSIS CATHETER;  Surgeon: Elam Dutch, MD;  Location: [employer];  Service: Vascular;  Laterality: N/A;  . IR PARACENTESIS  08/30/2017  . IR PARACENTESIS  09/29/2017  . IR PARACENTESIS  10/28/2017  . IR PARACENTESIS  11/09/2017  . IR PARACENTESIS  11/16/2017  . IR PARACENTESIS  11/28/2017  . IR PARACENTESIS  12/01/2017  . IR PARACENTESIS  12/06/2017  . IR PARACENTESIS  01/03/2018  . IR  PARACENTESIS  01/23/2018  . IR PARACENTESIS  02/07/2018  . IR PARACENTESIS  02/21/2018  . IR PARACENTESIS  03/06/2018  . IR PARACENTESIS  03/17/2018  . IR PARACENTESIS  04/04/2018  . IR PARACENTESIS  12/28/2018  . IR PARACENTESIS  01/08/2019  . IR PARACENTESIS  01/23/2019  . IR PARACENTESIS  02/01/2019  . IR PARACENTESIS  02/19/2019  . IR PARACENTESIS  03/01/2019  . IR PARACENTESIS  03/15/2019  . IR RADIOLOGIST EVAL & MGMT  02/14/2018  . IR RADIOLOGIST EVAL & MGMT  02/22/2019  . LEFT HEART CATH AND CORONARY ANGIOGRAPHY N/A 02/22/2017   Procedure: LEFT HEART CATH AND CORONARY ANGIOGRAPHY;  Surgeon: Nigel Mormon, MD;  Location: [place] [facility];  Service: Cardiovascular;  Laterality: N/A;  . LIGATION OF ARTERIOVENOUS  FISTULA Left 01/14/8114   Procedure: Plication of Left Arm Arteriovenous Fistula;  Surgeon: Elam Dutch, MD;  Location: [place];  Service: Vascular;  Laterality: Left;  .  POLYPECTOMY    . POLYPECTOMY  01/25/2018   Procedure: POLYPECTOMY;  Surgeon: Jerene Bears, MD;  Location: [place];  Service: Gastroenterology;;  . REVISON OF ARTERIOVENOUS FISTULA Left 2/97/9892   Procedure: PLICATION OF DISTAL ANEURYSMAL SEGEMENT OF LEFT UPPER ARM ARTERIOVENOUS FISTULA;  Surgeon: Elam Dutch, MD;  Location: [place];  Service: Vascular;  Laterality: Left;  . REVISON OF ARTERIOVENOUS FISTULA Left 07/30/4172   Procedure: Plication of Left Upper Arm Fistula ;  Surgeon: Waynetta Sandy, MD;  Location: [place];  Service: Vascular;  Laterality: Left;  . SKIN GRAFT SPLIT THICKNESS LEG / FOOT Left    SKIN GRAFT SPLIT THICKNESS LEFT ARM DONOR SITE: LEFT ANTERIOR THIGH  . SKIN SPLIT GRAFT Left 07/04/2017   Procedure: SKIN GRAFT SPLIT THICKNESS LEFT ARM DONOR SITE: LEFT ANTERIOR THIGH;  Surgeon: Elam Dutch, MD;  Location: [place];  Service: Vascular;  Laterality: Left;  . THROMBECTOMY W/ EMBOLECTOMY Left 06/05/2017   Procedure: EXPLORATION OF LEFT ARM FOR BLEEDING; OVERSEWED PROXIMAL  FISTULA;  Surgeon: Angelia Mould, MD;  Location: [place];  Service: Vascular;  Laterality: Left;  . WOUND EXPLORATION Left 06/03/2017   Procedure: WOUND EXPLORATION WITH WOUND VAC APPLICATION TO LEFT ARM;  Surgeon: Angelia Mould, MD;  Location: [place];  Service: Vascular;  Laterality: Left;    reports that he has been smoking cigarettes. He started smoking about 45 years ago. He has a 21.50 pack-year smoking history. He has never used smokeless tobacco. He reports previous alcohol use. He reports current drug use. Drugs: Marijuana and Cocaine. family history includes COPD in his father and another family member; Esophageal cancer in his sister; Heart disease in his father and mother; Hypertension in an other family member; Lung cancer in his mother; Malignant hyperthermia in his father; Throat cancer in his sister. Allergies  Allergen Reactions  . Morphine And Related Other (See Comments)    Stomach pain  . Aspirin Other (See Comments)    STOMACH PAIN  . Clonidine Derivatives Itching  . Tramadol Itching  . Tylenol [Acetaminophen] Nausea Only    Stomach ache      Outpatient Encounter Medications as of 03/22/2019  Medication Sig  . albuterol (VENTOLIN HFA) 108 (90 Base) MCG/ACT inhaler Inhale 2 puffs into the lungs every 6 (six) hours as needed for wheezing or shortness of breath.  . hydrALAZINE (APRESOLINE) 100 MG tablet Take 100 mg by mouth daily.  Marland Kitchen HYDROcodone-acetaminophen (NORCO/VICODIN) 5-325 MG tablet Take 1 tablet by mouth every 4 (four) hours as needed for moderate pain.  . hydrOXYzine (ATARAX/VISTARIL) 25 MG tablet Take 25 mg by mouth 2 (two) times daily as needed.  . lactulose (CHRONULAC) 10 GM/15ML solution TAKE 45 MLS BY MOUTH 2 TIMES DAILY. (Patient taking differently: Take 45 g by mouth 2 (two) times daily. )  . metoprolol tartrate (LOPRESSOR) 25 MG tablet Take 1 tablet (25 mg total) by mouth 2 (two) times daily.  . montelukast (SINGULAIR) 10 MG tablet Take 10  mg by mouth daily.  . ondansetron (ZOFRAN) 8 MG tablet Take 8 mg by mouth every 8 (eight) hours as needed for nausea or vomiting.   . pantoprazole (PROTONIX) 40 MG tablet Take 1 tablet (40 mg total) by mouth See admin instructions. Take one tablet (40 mg) by mouth in the morning on Sunday, Tuesday, Thursday, Saturday, take one tablet (40 mg) after dialysis on Monday, Wednesday, Friday. May also take one tablet (40 mg) in the evening as needed for  acid reflux.  . sevelamer carbonate (RENVELA) 800 MG tablet Take 4 tablets with meals  And 2 tablets twice daily with snacks (Patient taking differently: Take 1,600-3,200 mg by mouth See admin instructions. Take 3200 mg three time a day with meals, and 1600 mg twice daily with snacks)  . sulfamethoxazole-trimethoprim (BACTRIM DS) 800-160 MG tablet Take 1 tablet by mouth daily.  . SYMBICORT 160-4.5 MCG/ACT inhaler Inhale 2 puffs into the lungs 2 (two) times a day.  . zolpidem (AMBIEN) 5 MG tablet Take 5 mg by mouth at bedtime.   Facility-Administered Encounter Medications as of 03/22/2019  Medication  . albumin human 25 % solution 50 g    REVIEW OF SYSTEMS  : All other systems reviewed and negative except where noted in the History of Present Illness.   PHYSICAL EXAM: BP (!) 116/58   Pulse 83   Temp (!) 97.4 F (36.3 C)   Ht 5\' 10"  (1.778 m)   Wt 153 lb (69.4 kg)   BMI 21.95 kg/m  General: Well developed black male in no acute distress Head: Normocephalic and atraumatic Eyes:  Sclerae anicteric, conjunctiva pink. Ears: Normal auditory acuity Lungs: Clear throughout to auscultation; no increased WOB. Heart: Regular rate and rhythm; murmur noted. Abdomen: Soft, distended with ascites fluid.  BS present.  Mild diffuse TTP. Rectal:  Patient declined. Musculoskeletal: Symmetrical with no gross deformities  Skin: No lesions on visible extremities Extremities: No edema  Neurological: Alert oriented x 4, grossly non-focal Psychological:  Alert and  cooperative. Normal mood and affect  ASSESSMENT AND PLAN: 58 year old male with a history of decompensated cirrhosis secondary to hepatitis B and C with recurrent ascites and history of SBP, end-stage renal disease on dialysis, history of atrial fibrillation, COPD, history of multiple colon polyps, GERD, prior substance abuse, who is seen for follow-up.  1.  Cirrhosis with recurrent ascites --he has decompensated cirrhosis due to prior alcohol use in the setting of hepatitis B and C.  Deemed not a liver transplant candidate by [facility] and [facility] due to severe mitral stenosis and elevated PA pressures.  His end-stage renal disease and dialysis makes management of his ascites more difficult.  Has been getting LVP every 2 weeks.  Has an appt with cardiology next month to see if TIPS would be an option with his cardiac issues.  He should continue low-sodium diet --For now we will continue large-volume paracentesis with 50 g of IV albumin every 2 weeks, next would be 9/17. --Low-sodium diet --Will plan for EGD next week due to complaints of abdominal pain and black stools.  He declined rectal exam today.  Hgb was stable in the ED 2 days ago.  Will schedule with Dr. Hilarie Fredrickson for next Thursday, 9/17.  He says that he will be here at 330 PM for 430 PM procedure but his care partner cannot be here until 4 o'clock. --Continue pantoprazole 40 mg daily.  New prescription sent. --[place] screening up-to-date with recent cross-sectional imaging/CT abdomen pelvis.  No evidence for [place].  Repeat [place] screening with cross-sectional imaging in June 2021 versus ultrasound at that time --No evidence for hepatic encephalopathy today --continue lactulose 30 g twice daily.  2.  Viral hepatitis, history of hepatitis B and C --His liver enzymes are normal.  3.  History of multiple colon polyps --up-to-date with screening and surveillance colonoscopy.  Repeat colonoscopy recommended July 2022  4.  End-stage renal disease --on  hemodialysis, Monday Wednesday and Friday.  5.  GERD --he does not  have specific GERD symptoms.  I think continuing daily PPI is reasonable in the setting of cirrhosis for gastric protection.   CC:  [name], FNP

## 2019-03-22 NOTE — Patient Instructions (Signed)
If you are age 56 or older, your body mass index should be between 23-30. Your Body mass index is 21.95 kg/m. If this is out of the aforementioned range listed, please consider follow up with your Primary Care Provider.  If you are age 31 or younger, your body mass index should be between 19-25. Your Body mass index is 21.95 kg/m. If this is out of the aformentioned range listed, please consider follow up with your Primary Care Provider.   We have sent the following medications to your pharmacy for you to pick up at your convenience:  Pantoprazole 40mg  daily  You have been scheduled for an endoscopy. Please follow written instructions given to you at your visit today. If you use inhalers (even only as needed), please bring them with you on the day of your procedure.  Thank you for choosing me and [facility]  Alonza Bogus, PA-C

## 2019-03-22 NOTE — H&P (View-Only) (Signed)
03/22/2019 [facility] [id number] February 05, 1961   HISTORY OF PRESENT ILLNESS:  This is a 58 year old male who is a patient of Dr. Vena Rua.  He has a history of decompensated cirrhosis secondary to hepatitis B and C with recurrent ascites and history of SBP, end-stage renal disease on dialysis, history of atrial fibrillation, COPD, history of multiple colon polyps, GERD, prior substance abuse, who is seen for follow-up.  He is here alone today.  He was last seen in the office on 7/78/2020 by Dr. Hilarie Fredrickson.  He reports that he continues to struggle with recurrent ascites.  Is receiving LVP every 2 weeks.  Last paracentesis was on 9/3, fluid negative for SBP.  Complains of diffuse abdominal pain.  CT scan abdomen and pelvis with contrast on 8/27 showed the following:    IMPRESSION: Cirrhosis with large volume ascites again noted, stable since prior study.  Severely atrophic kidneys bilaterally with extensive renovascular calcifications.  Aortic and branch vessel atherosclerosis.  Cardiomegaly, small pericardial effusion.  He reports he is taking his lactulose twice a day and having 2-3 bowel movements per day.  He is taking Protonix 40 mg a day.  He also reports compliance with Bactrim 1 double strength tablet daily which he takes for SBP prophylaxis.  Dr. Merrilee Jansky with [facility] reviewed the patient's chart.  Per note it was felt that he was not a liver transplant candidate due to issues with adherence to dialysis regimen.  This was also discussed with [name] at [facility] who believed that patient would not be a candidate due to his severe mitral stenosis and elevated PA pressures.  TIPS procedure has been discussed, but he has an appointment with cardiology October 19 2 have them assess and see if this would be an option with his cardiac conditions.  He also reports black stools for the past week.  Hemoglobin was stable at 11.8 g at his emergency room visit 2 days ago.  He  denies any hematemesis or coffee-ground emesis.  Denied rectal exam in the office today.   Past Medical History:  Diagnosis Date  . Anemia   . Anxiety   . Arthritis    left shoulder  . Atherosclerosis of aorta ([place])   . Cardiomegaly   . Chest pain    DATE UNKNOWN, C/O PERIODICALLY  . Cocaine abuse ([place])   . COPD exacerbation ([place]) 08/17/2016  . Coronary artery disease    stent 02/22/17  . ESRD (end stage renal disease) on dialysis ([facility])    "[name]; MWF" (07/04/2017)  . GERD (gastroesophageal reflux disease)    DATE UNKNOWN  . Hemorrhoids   . Hepatitis B, chronic ([place])   . Hepatitis C   . History of kidney stones   . Hyperkalemia   . Hypertension   . Kidney failure   . Metabolic bone disease    Patient denies  . Mitral stenosis   . Myocardial infarction ([place])   . Pneumonia   . Pulmonary edema   . Solitary rectal ulcer syndrome 07/2017   at flex sig for rectal bleeding  . Tubular adenoma of colon    Past Surgical History:  Procedure Laterality Date  . A/V FISTULAGRAM Left 05/26/2017   Procedure: A/V FISTULAGRAM;  Surgeon: Conrad South Shore, MD;  Location: [place] [facility];  Service: Cardiovascular;  Laterality: Left;  . A/V FISTULAGRAM Right 11/18/2017   Procedure: A/V FISTULAGRAM - Right Arm;  Surgeon: Elam Dutch, MD;  Location: [place] [facility];  Service: Cardiovascular;  Laterality: Right;  . APPLICATION OF WOUND VAC Left 06/14/2017   Procedure: APPLICATION OF WOUND VAC;  Surgeon: Katha Cabal, MD;  Location: [facility];  Service: Vascular;  Laterality: Left;  . AV FISTULA PLACEMENT  2012   BELIEVED WAS PLACED IN JUNE  . AV FISTULA PLACEMENT Right 08/09/2017   Procedure: Creation Right arm ARTERIOVENOUS BRACHIOCEPOHALIC FISTULA;  Surgeon: Elam Dutch, MD;  Location: [facility] OR;  Service: Vascular;  Laterality: Right;  . AV FISTULA PLACEMENT Right 11/22/2017   Procedure: INSERTION OF ARTERIOVENOUS (AV) GORE-TEX GRAFT RIGHT UPPER ARM;  Surgeon: Elam Dutch, MD;  Location: [place];  Service: Vascular;  Laterality: Right;  . BIOPSY  01/25/2018   Procedure: BIOPSY;  Surgeon: Jerene Bears, MD;  Location: [facility];  Service: Gastroenterology;;  . COLONOSCOPY    . COLONOSCOPY WITH PROPOFOL N/A 01/25/2018   Procedure: COLONOSCOPY WITH PROPOFOL;  Surgeon: Jerene Bears, MD;  Location: [place];  Service: Gastroenterology;  Laterality: N/A;  . CORONARY STENT INTERVENTION N/A 02/22/2017   Procedure: CORONARY STENT INTERVENTION;  Surgeon: Nigel Mormon, MD;  Location: [place] [facility];  Service: Cardiovascular;  Laterality: N/A;  . ESOPHAGOGASTRODUODENOSCOPY (EGD) WITH PROPOFOL N/A 01/25/2018   Procedure: ESOPHAGOGASTRODUODENOSCOPY (EGD) WITH PROPOFOL;  Surgeon: Jerene Bears, MD;  Location: [place];  Service: Gastroenterology;  Laterality: N/A;  . FLEXIBLE SIGMOIDOSCOPY N/A 07/15/2017   Procedure: FLEXIBLE SIGMOIDOSCOPY;  Surgeon: Carol Ada, MD;  Location: [place];  Service: Endoscopy;  Laterality: N/A;  . HEMORRHOID BANDING    . I&D EXTREMITY Left 06/01/2017   Procedure: IRRIGATION AND DEBRIDEMENT LEFT ARM HEMATOMA WITH LIGATION OF LEFT ARM AV FISTULA;  Surgeon: Elam Dutch, MD;  Location: [place];  Service: Vascular;  Laterality: Left;  . I&D EXTREMITY Left 06/14/2017   Procedure: IRRIGATION AND DEBRIDEMENT EXTREMITY;  Surgeon: Katha Cabal, MD;  Location: [facility];  Service: Vascular;  Laterality: Left;  . INSERTION OF DIALYSIS CATHETER  05/30/2017  . INSERTION OF DIALYSIS CATHETER N/A 05/30/2017   Procedure: INSERTION OF DIALYSIS CATHETER;  Surgeon: Elam Dutch, MD;  Location: [place];  Service: Vascular;  Laterality: N/A;  . IR PARACENTESIS  08/30/2017  . IR PARACENTESIS  09/29/2017  . IR PARACENTESIS  10/28/2017  . IR PARACENTESIS  11/09/2017  . IR PARACENTESIS  11/16/2017  . IR PARACENTESIS  11/28/2017  . IR PARACENTESIS  12/01/2017  . IR PARACENTESIS  12/06/2017  . IR PARACENTESIS  01/03/2018  . IR  PARACENTESIS  01/23/2018  . IR PARACENTESIS  02/07/2018  . IR PARACENTESIS  02/21/2018  . IR PARACENTESIS  03/06/2018  . IR PARACENTESIS  03/17/2018  . IR PARACENTESIS  04/04/2018  . IR PARACENTESIS  12/28/2018  . IR PARACENTESIS  01/08/2019  . IR PARACENTESIS  01/23/2019  . IR PARACENTESIS  02/01/2019  . IR PARACENTESIS  02/19/2019  . IR PARACENTESIS  03/01/2019  . IR PARACENTESIS  03/15/2019  . IR RADIOLOGIST EVAL & MGMT  02/14/2018  . IR RADIOLOGIST EVAL & MGMT  02/22/2019  . LEFT HEART CATH AND CORONARY ANGIOGRAPHY N/A 02/22/2017   Procedure: LEFT HEART CATH AND CORONARY ANGIOGRAPHY;  Surgeon: Nigel Mormon, MD;  Location: [place] [facility];  Service: Cardiovascular;  Laterality: N/A;  . LIGATION OF ARTERIOVENOUS  FISTULA Left 07/17/1094   Procedure: Plication of Left Arm Arteriovenous Fistula;  Surgeon: Elam Dutch, MD;  Location: [place];  Service: Vascular;  Laterality: Left;  .  POLYPECTOMY    . POLYPECTOMY  01/25/2018   Procedure: POLYPECTOMY;  Surgeon: Jerene Bears, MD;  Location: [place];  Service: Gastroenterology;;  . REVISON OF ARTERIOVENOUS FISTULA Left 01/19/6268   Procedure: PLICATION OF DISTAL ANEURYSMAL SEGEMENT OF LEFT UPPER ARM ARTERIOVENOUS FISTULA;  Surgeon: Elam Dutch, MD;  Location: [place];  Service: Vascular;  Laterality: Left;  . REVISON OF ARTERIOVENOUS FISTULA Left 4/85/4627   Procedure: Plication of Left Upper Arm Fistula ;  Surgeon: Waynetta Sandy, MD;  Location: [place];  Service: Vascular;  Laterality: Left;  . SKIN GRAFT SPLIT THICKNESS LEG / FOOT Left    SKIN GRAFT SPLIT THICKNESS LEFT ARM DONOR SITE: LEFT ANTERIOR THIGH  . SKIN SPLIT GRAFT Left 07/04/2017   Procedure: SKIN GRAFT SPLIT THICKNESS LEFT ARM DONOR SITE: LEFT ANTERIOR THIGH;  Surgeon: Elam Dutch, MD;  Location: [place];  Service: Vascular;  Laterality: Left;  . THROMBECTOMY W/ EMBOLECTOMY Left 06/05/2017   Procedure: EXPLORATION OF LEFT ARM FOR BLEEDING; OVERSEWED PROXIMAL  FISTULA;  Surgeon: Angelia Mould, MD;  Location: [place];  Service: Vascular;  Laterality: Left;  . WOUND EXPLORATION Left 06/03/2017   Procedure: WOUND EXPLORATION WITH WOUND VAC APPLICATION TO LEFT ARM;  Surgeon: Angelia Mould, MD;  Location: [place];  Service: Vascular;  Laterality: Left;    reports that he has been smoking cigarettes. He started smoking about 45 years ago. He has a 21.50 pack-year smoking history. He has never used smokeless tobacco. He reports previous alcohol use. He reports current drug use. Drugs: Marijuana and Cocaine. family history includes COPD in his father and another family member; Esophageal cancer in his sister; Heart disease in his father and mother; Hypertension in an other family member; Lung cancer in his mother; Malignant hyperthermia in his father; Throat cancer in his sister. Allergies  Allergen Reactions  . Morphine And Related Other (See Comments)    Stomach pain  . Aspirin Other (See Comments)    STOMACH PAIN  . Clonidine Derivatives Itching  . Tramadol Itching  . Tylenol [Acetaminophen] Nausea Only    Stomach ache      Outpatient Encounter Medications as of 03/22/2019  Medication Sig  . albuterol (VENTOLIN HFA) 108 (90 Base) MCG/ACT inhaler Inhale 2 puffs into the lungs every 6 (six) hours as needed for wheezing or shortness of breath.  . hydrALAZINE (APRESOLINE) 100 MG tablet Take 100 mg by mouth daily.  Marland Kitchen HYDROcodone-acetaminophen (NORCO/VICODIN) 5-325 MG tablet Take 1 tablet by mouth every 4 (four) hours as needed for moderate pain.  . hydrOXYzine (ATARAX/VISTARIL) 25 MG tablet Take 25 mg by mouth 2 (two) times daily as needed.  . lactulose (CHRONULAC) 10 GM/15ML solution TAKE 45 MLS BY MOUTH 2 TIMES DAILY. (Patient taking differently: Take 45 g by mouth 2 (two) times daily. )  . metoprolol tartrate (LOPRESSOR) 25 MG tablet Take 1 tablet (25 mg total) by mouth 2 (two) times daily.  . montelukast (SINGULAIR) 10 MG tablet Take 10  mg by mouth daily.  . ondansetron (ZOFRAN) 8 MG tablet Take 8 mg by mouth every 8 (eight) hours as needed for nausea or vomiting.   . pantoprazole (PROTONIX) 40 MG tablet Take 1 tablet (40 mg total) by mouth See admin instructions. Take one tablet (40 mg) by mouth in the morning on Sunday, Tuesday, Thursday, Saturday, take one tablet (40 mg) after dialysis on Monday, Wednesday, Friday. May also take one tablet (40 mg) in the evening as needed for  acid reflux.  . sevelamer carbonate (RENVELA) 800 MG tablet Take 4 tablets with meals  And 2 tablets twice daily with snacks (Patient taking differently: Take 1,600-3,200 mg by mouth See admin instructions. Take 3200 mg three time a day with meals, and 1600 mg twice daily with snacks)  . sulfamethoxazole-trimethoprim (BACTRIM DS) 800-160 MG tablet Take 1 tablet by mouth daily.  . SYMBICORT 160-4.5 MCG/ACT inhaler Inhale 2 puffs into the lungs 2 (two) times a day.  . zolpidem (AMBIEN) 5 MG tablet Take 5 mg by mouth at bedtime.   Facility-Administered Encounter Medications as of 03/22/2019  Medication  . albumin human 25 % solution 50 g    REVIEW OF SYSTEMS  : All other systems reviewed and negative except where noted in the History of Present Illness.   PHYSICAL EXAM: BP (!) 116/58   Pulse 83   Temp (!) 97.4 F (36.3 C)   Ht 5\' 10"  (1.778 m)   Wt 153 lb (69.4 kg)   BMI 21.95 kg/m  General: Well developed black male in no acute distress Head: Normocephalic and atraumatic Eyes:  Sclerae anicteric, conjunctiva pink. Ears: Normal auditory acuity Lungs: Clear throughout to auscultation; no increased WOB. Heart: Regular rate and rhythm; murmur noted. Abdomen: Soft, distended with ascites fluid.  BS present.  Mild diffuse TTP. Rectal:  Patient declined. Musculoskeletal: Symmetrical with no gross deformities  Skin: No lesions on visible extremities Extremities: No edema  Neurological: Alert oriented x 4, grossly non-focal Psychological:  Alert and  cooperative. Normal mood and affect  ASSESSMENT AND PLAN: 58 year old male with a history of decompensated cirrhosis secondary to hepatitis B and C with recurrent ascites and history of SBP, end-stage renal disease on dialysis, history of atrial fibrillation, COPD, history of multiple colon polyps, GERD, prior substance abuse, who is seen for follow-up.  1.  Cirrhosis with recurrent ascites --he has decompensated cirrhosis due to prior alcohol use in the setting of hepatitis B and C.  Deemed not a liver transplant candidate by [facility] and [facility] due to severe mitral stenosis and elevated PA pressures.  His end-stage renal disease and dialysis makes management of his ascites more difficult.  Has been getting LVP every 2 weeks.  Has an appt with cardiology next month to see if TIPS would be an option with his cardiac issues.  He should continue low-sodium diet --For now we will continue large-volume paracentesis with 50 g of IV albumin every 2 weeks, next would be 9/17. --Low-sodium diet --Will plan for EGD next week due to complaints of abdominal pain and black stools.  He declined rectal exam today.  Hgb was stable in the ED 2 days ago.  Will schedule with Dr. Hilarie Fredrickson for next Thursday, 9/17.  He says that he will be here at 330 PM for 430 PM procedure but his care partner cannot be here until 4 o'clock. --Continue pantoprazole 40 mg daily.  New prescription sent. --[place] screening up-to-date with recent cross-sectional imaging/CT abdomen pelvis.  No evidence for [place].  Repeat [place] screening with cross-sectional imaging in June 2021 versus ultrasound at that time --No evidence for hepatic encephalopathy today --continue lactulose 30 g twice daily.  2.  Viral hepatitis, history of hepatitis B and C --His liver enzymes are normal.  3.  History of multiple colon polyps --up-to-date with screening and surveillance colonoscopy.  Repeat colonoscopy recommended July 2022  4.  End-stage renal disease --on  hemodialysis, Monday Wednesday and Friday.  5.  GERD --he does not  have specific GERD symptoms.  I think continuing daily PPI is reasonable in the setting of cirrhosis for gastric protection.   CC:  [name], FNP

## 2019-03-23 DIAGNOSIS — N2581 Secondary hyperparathyroidism of renal origin: Secondary | ICD-10-CM | POA: Diagnosis not present

## 2019-03-23 DIAGNOSIS — D631 Anemia in chronic kidney disease: Secondary | ICD-10-CM | POA: Diagnosis not present

## 2019-03-23 DIAGNOSIS — D509 Iron deficiency anemia, unspecified: Secondary | ICD-10-CM | POA: Diagnosis not present

## 2019-03-23 DIAGNOSIS — Z992 Dependence on renal dialysis: Secondary | ICD-10-CM | POA: Diagnosis not present

## 2019-03-23 DIAGNOSIS — N186 End stage renal disease: Secondary | ICD-10-CM | POA: Diagnosis not present

## 2019-03-26 DIAGNOSIS — D509 Iron deficiency anemia, unspecified: Secondary | ICD-10-CM | POA: Diagnosis not present

## 2019-03-26 DIAGNOSIS — D631 Anemia in chronic kidney disease: Secondary | ICD-10-CM | POA: Diagnosis not present

## 2019-03-26 DIAGNOSIS — Z992 Dependence on renal dialysis: Secondary | ICD-10-CM | POA: Diagnosis not present

## 2019-03-26 DIAGNOSIS — N2581 Secondary hyperparathyroidism of renal origin: Secondary | ICD-10-CM | POA: Diagnosis not present

## 2019-03-26 DIAGNOSIS — N186 End stage renal disease: Secondary | ICD-10-CM | POA: Diagnosis not present

## 2019-03-27 ENCOUNTER — Telehealth: Payer: Self-pay | Admitting: *Deleted

## 2019-03-27 NOTE — Telephone Encounter (Signed)
-----   Message from Osvaldo Angst, CRNA sent at 03/27/2019 11:11 AM EDT ----- Regarding: ASA IV Dr. Hilarie Fredrickson,  This pt is scheduled with you for EGD Thursday, Sept 17.  I reviewed his chart last week but failed to send a note.  He is clearly a ASA IV, is due for a paracentesis, and his procedure should be performed in the hospital.  I regret the late notification.  Thanks,  Osvaldo Angst

## 2019-03-27 NOTE — Telephone Encounter (Signed)
Left voicemail for patient that we are cancelling his endoscopy on Thursday, 03/29/19 due to anesthesia request for his procedure to be completed at the hospital. I have asked that he returned my call so he can be rescheduled (he can be rescheduled to 04/09/19 or 04/10/19 at 1 pm). He will also need COVID testing prior to appointment.

## 2019-03-28 ENCOUNTER — Other Ambulatory Visit: Payer: Self-pay | Admitting: *Deleted

## 2019-03-28 DIAGNOSIS — Z992 Dependence on renal dialysis: Secondary | ICD-10-CM | POA: Diagnosis not present

## 2019-03-28 DIAGNOSIS — K7460 Unspecified cirrhosis of liver: Secondary | ICD-10-CM

## 2019-03-28 DIAGNOSIS — N186 End stage renal disease: Secondary | ICD-10-CM | POA: Diagnosis not present

## 2019-03-28 DIAGNOSIS — D631 Anemia in chronic kidney disease: Secondary | ICD-10-CM | POA: Diagnosis not present

## 2019-03-28 DIAGNOSIS — N2581 Secondary hyperparathyroidism of renal origin: Secondary | ICD-10-CM | POA: Diagnosis not present

## 2019-03-28 DIAGNOSIS — K921 Melena: Secondary | ICD-10-CM

## 2019-03-28 DIAGNOSIS — R188 Other ascites: Secondary | ICD-10-CM

## 2019-03-28 DIAGNOSIS — D509 Iron deficiency anemia, unspecified: Secondary | ICD-10-CM | POA: Diagnosis not present

## 2019-03-28 IMAGING — DX DG CHEST 2V
2 series · 2 of 2 positions shown · non-contrast
Comparison: 09/02/2017

CLINICAL DATA: Short of breath

EXAM:
CHEST - 2 VIEW

[chest lat]
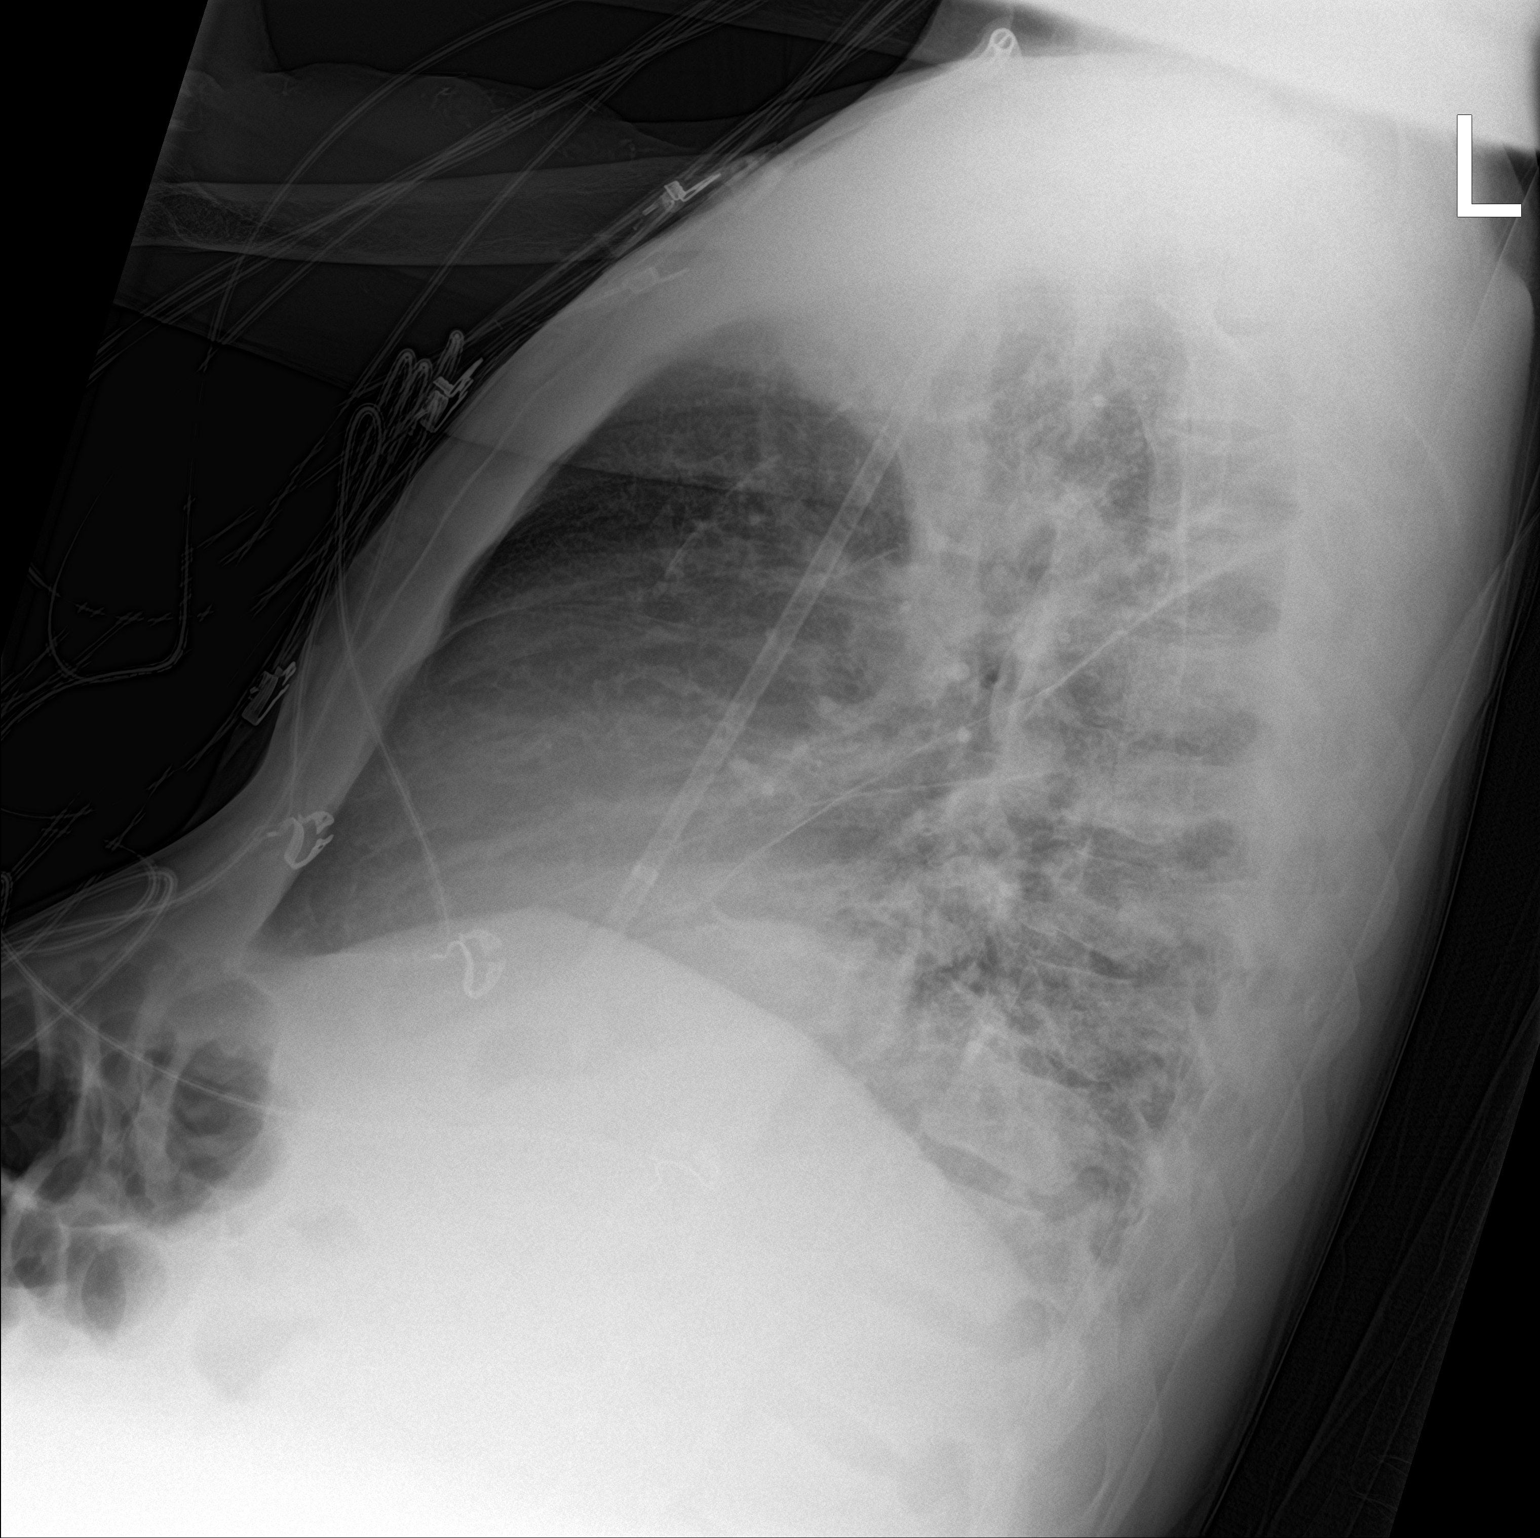

[chest ap]
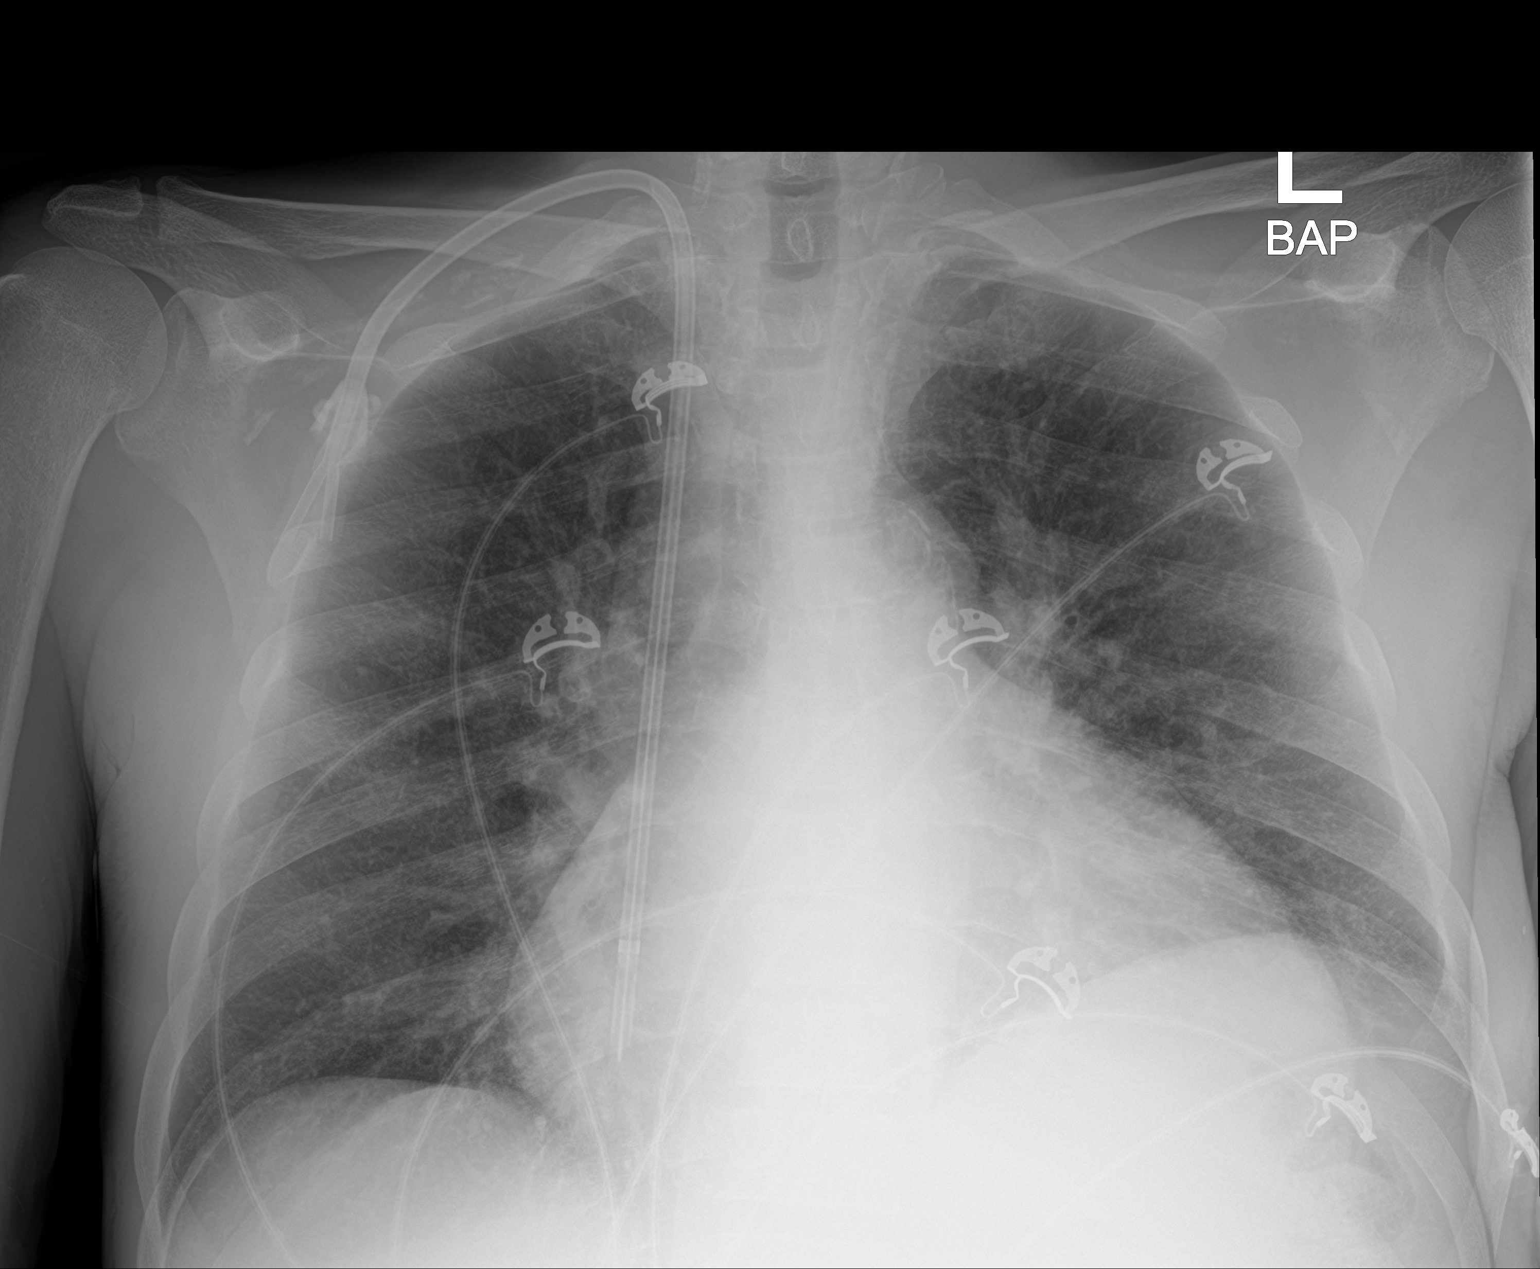

[2 of 2 positions shown; findings below may reference images not displayed]

FINDINGS: Right-sided central venous catheter tip overlies the right atrium.
Aortic atherosclerosis. Carotid vascular calcifications. Axillary
vascular. Cardiomegaly with elevation of left diaphragm as before.
No large effusion. No focal consolidation. Vascular congestion. No
pneumothorax.
IMPRESSION: 1. Cardiomegaly with mild vascular congestion
2. No pleural effusion or focal pulmonary infiltrate.

## 2019-03-28 NOTE — Telephone Encounter (Signed)
[name] called back to reschedule endoscopy to hospital. He is trying to arrange for transportation for 04/10/19. He will be back in touch with me once this has been arranged.

## 2019-03-28 NOTE — Telephone Encounter (Signed)
Mr.Gasaway indicates he has transportation for covid testing at 1230 on Saturday, 04/07/19 and for hospital endoscopy starting with arrival at 1130 am on 04/10/19. I have given him verbal instructions for time/date/prep/location for both appointments but have also mailed instructions to him home address (I verified address with him).  Also of note: we have verified with Frederich Cha, RN at [facility] that as of current regulations, patient may have COVID testing on Saturday and go strictly for dialysis on Monday prior to his Tuesday 9/929/20 procedure without penalty.

## 2019-03-29 ENCOUNTER — Encounter: Payer: Medicare Other | Admitting: Internal Medicine

## 2019-03-29 ENCOUNTER — Ambulatory Visit (HOSPITAL_COMMUNITY): Payer: Medicare Other

## 2019-03-30 ENCOUNTER — Other Ambulatory Visit: Payer: Self-pay

## 2019-03-30 ENCOUNTER — Encounter (HOSPITAL_COMMUNITY): Payer: Self-pay

## 2019-03-30 ENCOUNTER — Ambulatory Visit (HOSPITAL_COMMUNITY)
Admission: RE | Admit: 2019-03-30 | Discharge: 2019-03-30 | Disposition: A | Discharge status: Home / Self Care | Payer: Medicare Other | Source: Ambulatory Visit | Attending: Internal Medicine | Admitting: Internal Medicine

## 2019-03-30 DIAGNOSIS — D631 Anemia in chronic kidney disease: Secondary | ICD-10-CM | POA: Diagnosis not present

## 2019-03-30 DIAGNOSIS — N2581 Secondary hyperparathyroidism of renal origin: Secondary | ICD-10-CM | POA: Diagnosis not present

## 2019-03-30 DIAGNOSIS — Z992 Dependence on renal dialysis: Secondary | ICD-10-CM | POA: Diagnosis not present

## 2019-03-30 DIAGNOSIS — N186 End stage renal disease: Secondary | ICD-10-CM | POA: Diagnosis not present

## 2019-03-30 DIAGNOSIS — D509 Iron deficiency anemia, unspecified: Secondary | ICD-10-CM | POA: Diagnosis not present

## 2019-03-30 DIAGNOSIS — R188 Other ascites: Secondary | ICD-10-CM

## 2019-03-30 IMAGING — CR DG ABDOMEN ACUTE W/ 1V CHEST
4 series · 4 of 4 positions shown · non-contrast
Comparison: PA and lateral chest 09/26/2017. CT abdomen and pelvis
09/02/2017.

CLINICAL DATA: Shortness of breath, abdominal pain, distention and
diarrhea for 3 days.

EXAM:
DG ABDOMEN ACUTE W/ 1V CHEST

[chest pa (1 of 2)]
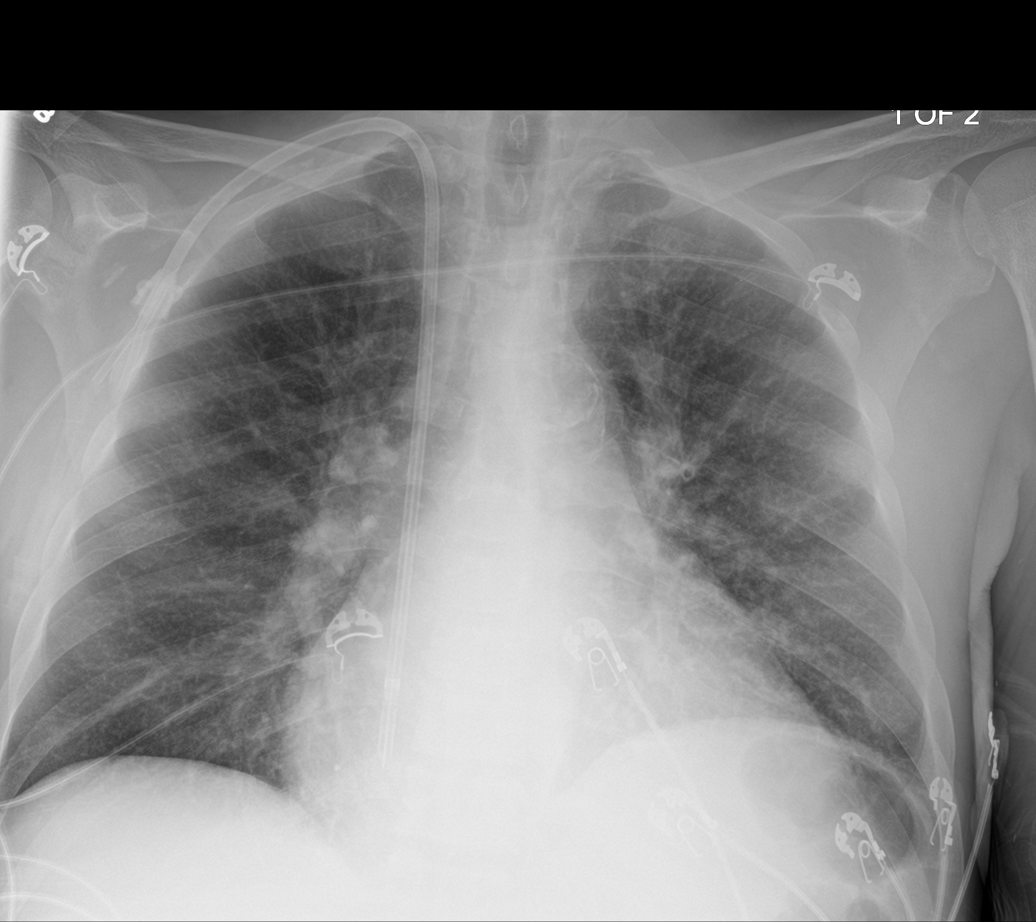

[abdomen erect]
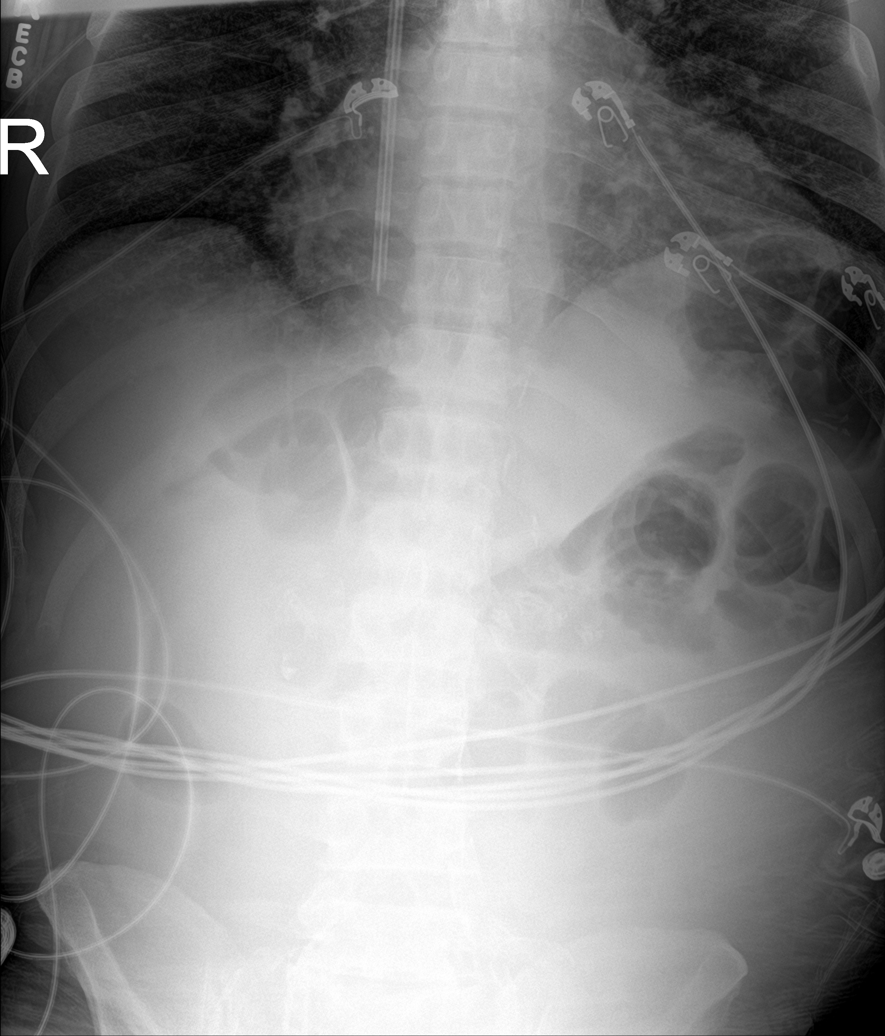

[abdomen supine]
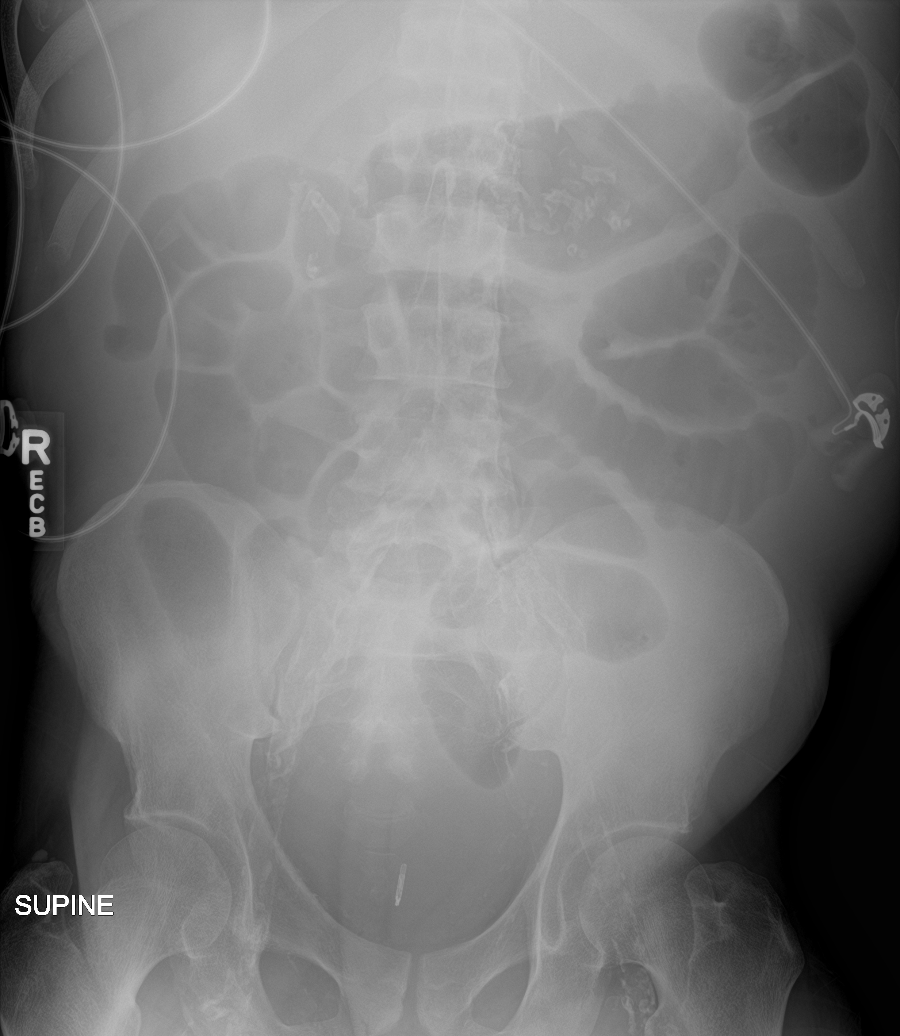

[chest pa (2 of 2)]
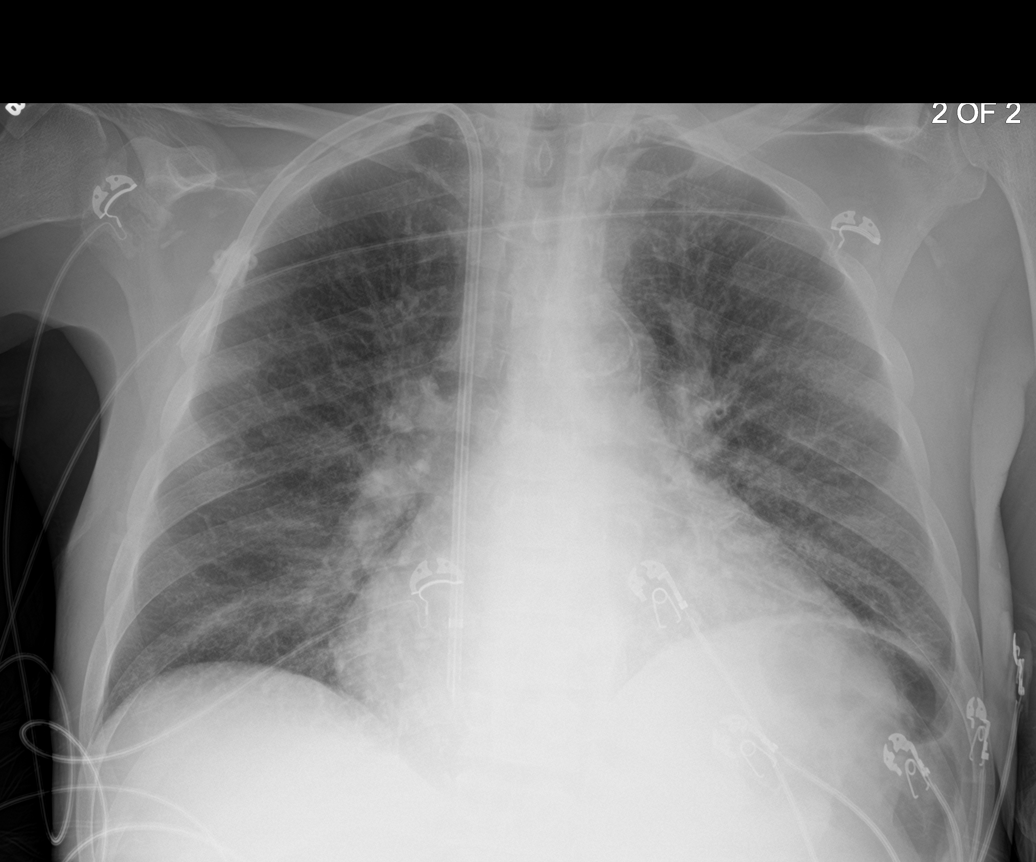

[4 of 4 positions shown; findings below may reference images not displayed]

FINDINGS: Single-view of the chest demonstrates enlarged cardiopericardial
silhouette consistent with cardiomegaly and small pericardial
effusion seen on prior CT. There is pulmonary vascular congestion.
No consolidative process, pneumothorax or effusion. Aortic
atherosclerosis is seen. Dialysis catheter is unchanged.

Two views of the abdomen show no free intraperitoneal air. There is
centralization of bowel loops and increased density of the abdomen
consistent with ascites as seen on prior CT. Small bowel loops are
mildly dilated up to 4.0 cm.
IMPRESSION: Mild dilatation of small bowel loops could be due to ileus or
obstruction.

Findings consistent with ascites as seen on prior CT.

Enlarged cardiopericardial silhouette consistent with cardiomegaly
and small pericardial effusion as seen on prior CT.

Pulmonary vascular congestion.

## 2019-03-30 IMAGING — CT CT ABD-PELV W/O CM
2 of 4 series · 16 of 46 positions shown, 18 images · non-contrast
Comparison: Abdominal radiographs earlier this day.  CT 09/02/2017

CLINICAL DATA: History of ascites. Abdominal distension. Question
small bowel obstruction on radiographs.

EXAM:
CT ABDOMEN AND PELVIS WITHOUT CONTRAST
TECHNIQUE: Multidetector CT imaging of the abdomen and pelvis was performed
following the standard protocol without IV contrast.

[Series 3: abd/ pelvis 5.0 i30f 2 · axial · 0.79mm/px · z∈[-122,+318]mm · 13 of 96 slices shown, 15 images]
[im 4/96  soft-tissue]
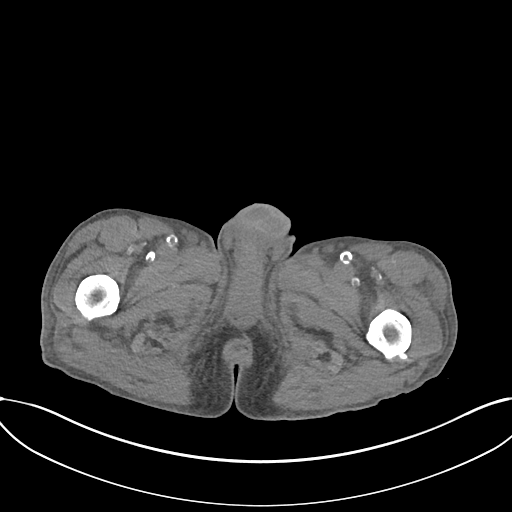
[im 4/96  bone]
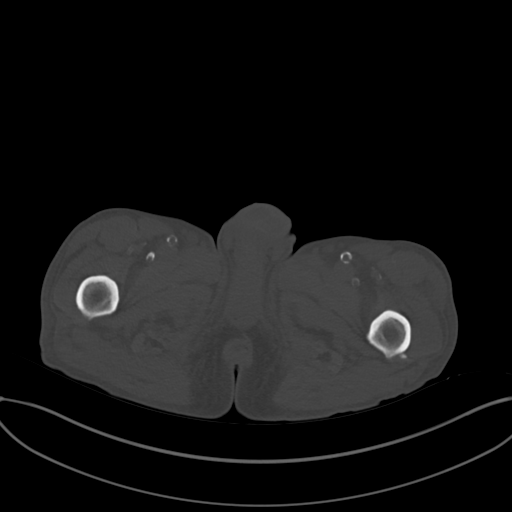
[im 12/96  soft-tissue]
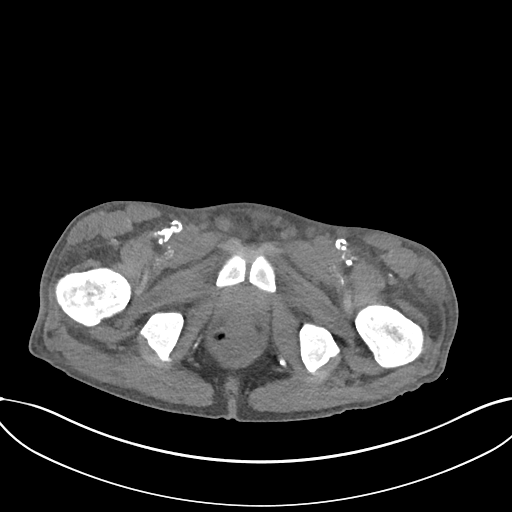
[im 20/96  soft-tissue]
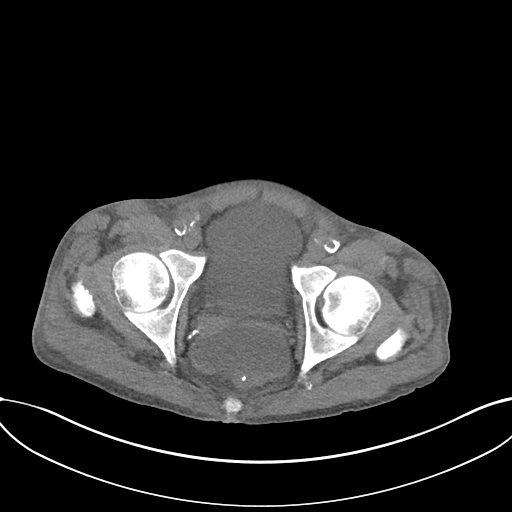
[im 28/96  soft-tissue]
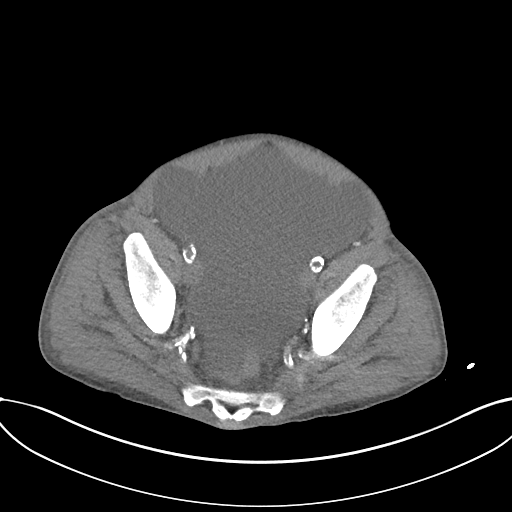
[im 32/96  soft-tissue]
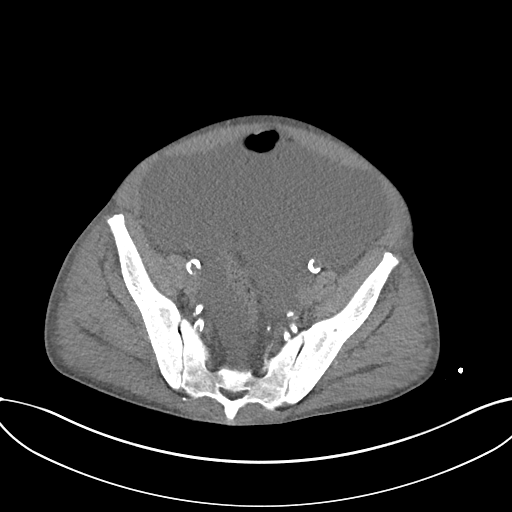
[im 40/96  soft-tissue]
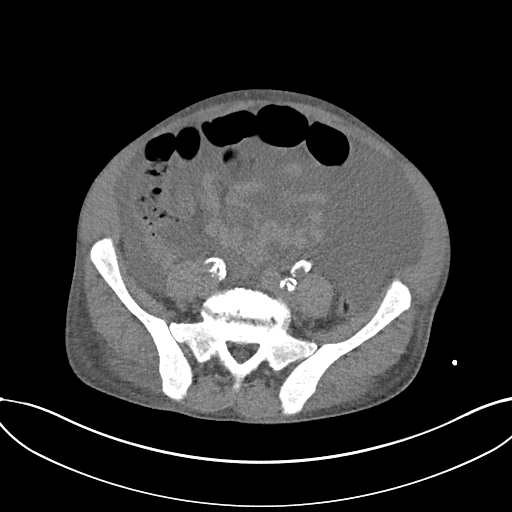
[im 48/96  soft-tissue]
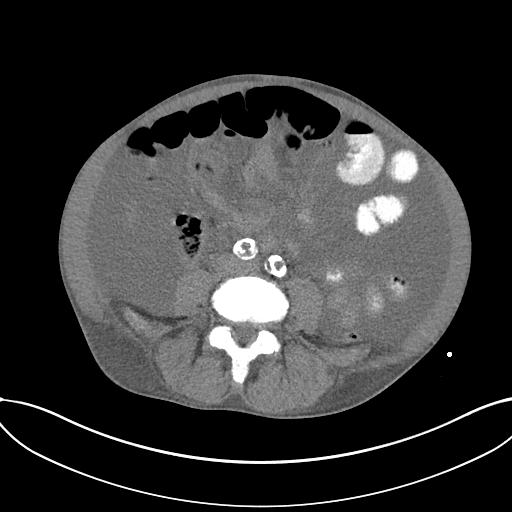
[im 56/96  soft-tissue]
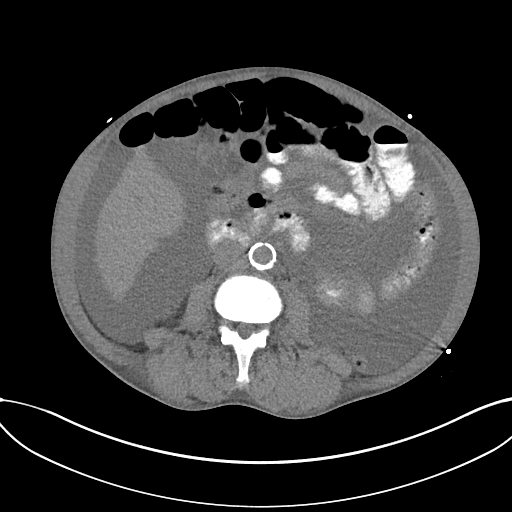
[im 64/96  soft-tissue]
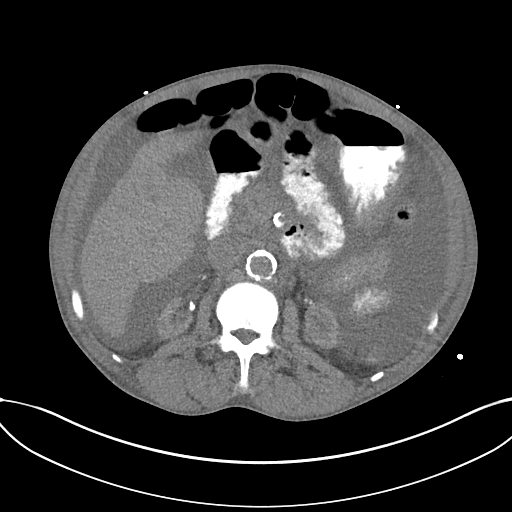
[im 64/96  bone]
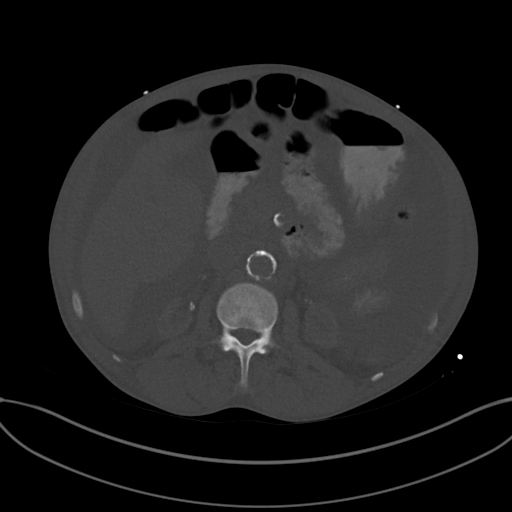
[im 68/96  soft-tissue]
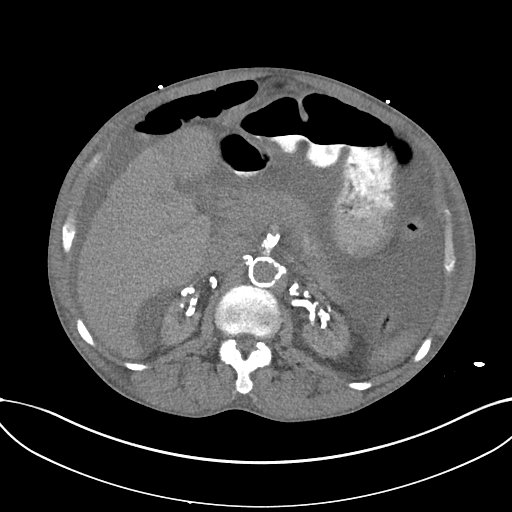
[im 76/96  soft-tissue]
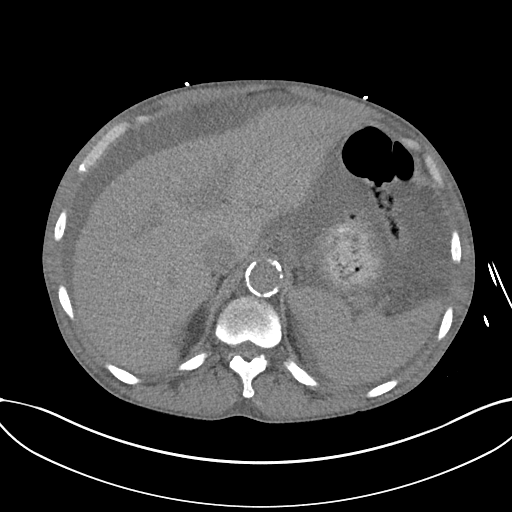
[im 84/96  soft-tissue]
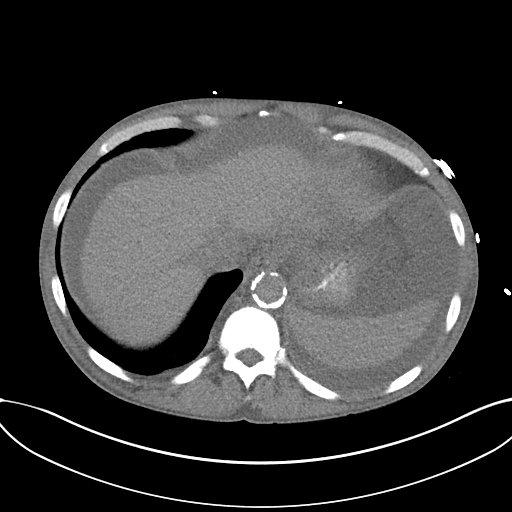
[im 92/96  soft-tissue]
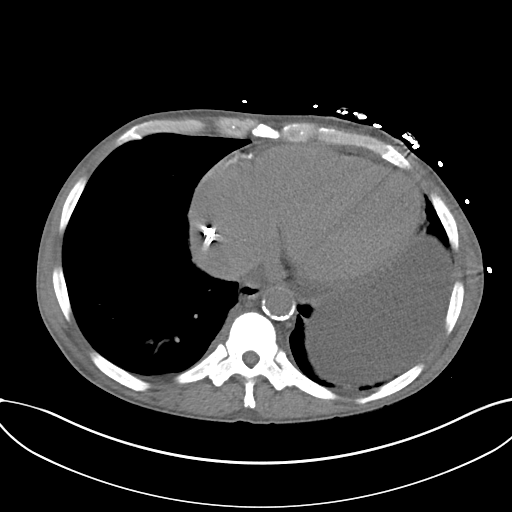

[Series 6: cor st · coronal · 0.80mm/px · 3 of 99 slices shown]
[im 33/99  soft-tissue]
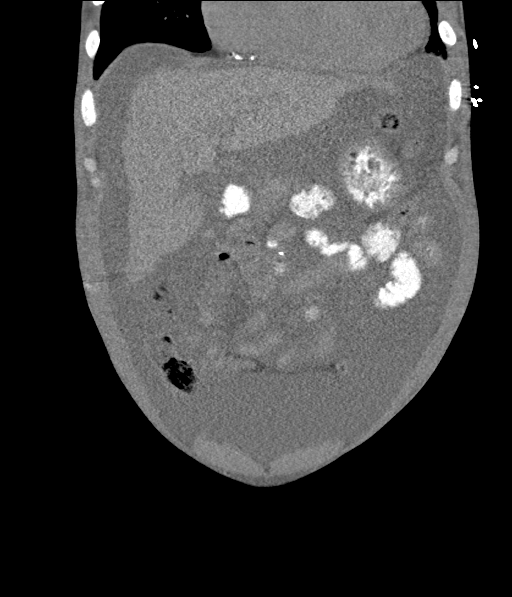
[im 44/99  soft-tissue]
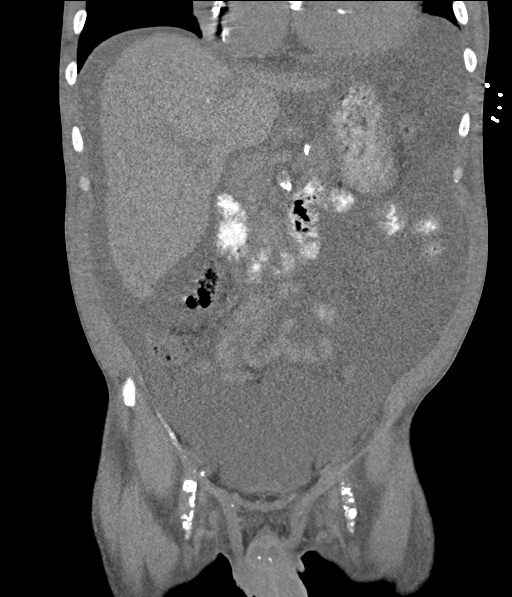
[im 55/99  soft-tissue]
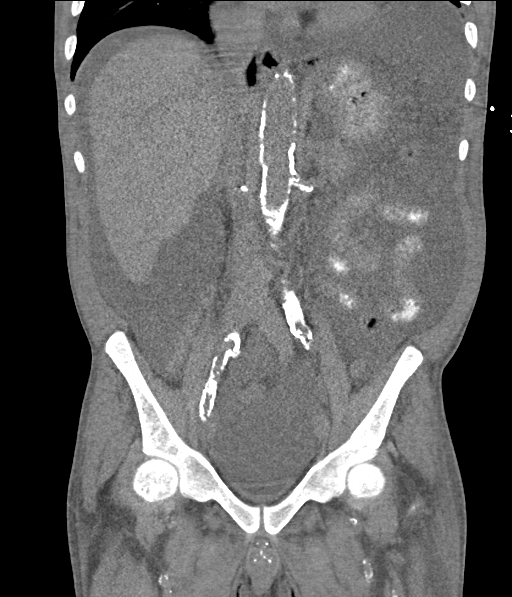

[16 of 46 positions shown; findings below may reference images not displayed]

FINDINGS: Lower chest: The heart is enlarged. Minimal pericardial
thickening/fluid. Left basilar atelectasis.

Hepatobiliary: Slight capsular nodularity which may represent
cirrhosis. No discrete focal lesion. Gallbladder is not well
visualized due to adjacent ascites.

Pancreas: No ductal dilatation. Limited assessment for
peripancreatic inflammation given adjacent ascites.

Spleen: Normal in size without focal abnormality.

Adrenals/Urinary Tract: Adrenal glands not as well visualized on the
current exam in the absence of IV contrast. Atrophic kidneys
consistent with chronic renal disease. Probable parenchymal
calcification in the upper right kidney. Advanced renovascular
calcifications. Urinary bladder is nondistended.

Stomach/Bowel: Administered enteric contrast within the stomach and
proximal small bowel. Bowel loops upper normal caliber measuring
cm but no definite bowel dilatation to suggest obstruction. More
distal small bowel is nondistended. No colonic distension. Probable
clip in the distal sigmoid colon.

Vascular/Lymphatic: Advanced aortic and branch atherosclerosis.
Limited assessment for adenopathy given presence of ascites.

Reproductive: Prostate is unremarkable.

Other: Large volume intra-abdominopelvic ascites, progressed from
prior CT. Chronic hyperdensity dependently in the is situs in the
right pelvis, unchanged dating back to at least 3029 and of doubtful
clinical significance. No free air.

Musculoskeletal: Sequela of renal osteodystrophy with degenerative
disc disease L5-S1. There are no acute or suspicious osseous
abnormalities.
IMPRESSION: 1. Slight small bowel prominence suggesting ileus. No evidence of
obstruction.
2. Large volume intra-abdominopelvic ascites, increased from CT 1
month ago.
3. Advanced Aortic Atherosclerosis (ODESL-CFC.C).

## 2019-03-30 MED ORDER — ALBUMIN HUMAN 25 % IV SOLN
50.0000 g | Freq: Once | INTRAVENOUS | Status: DC
  Filled 2019-03-30: qty 200

## 2019-03-31 IMAGING — US IR PARACENTESIS
1 series · 7 of 7 positions shown · non-contrast
Comparison: none

INDICATION: Patient with recurrent ascites. Request is made for diagnostic and
therapeutic paracentesis.

[Series 1: ir (id) (id)/(id)/(id) ir · 7 of 7 slices shown]
[im 1/7]
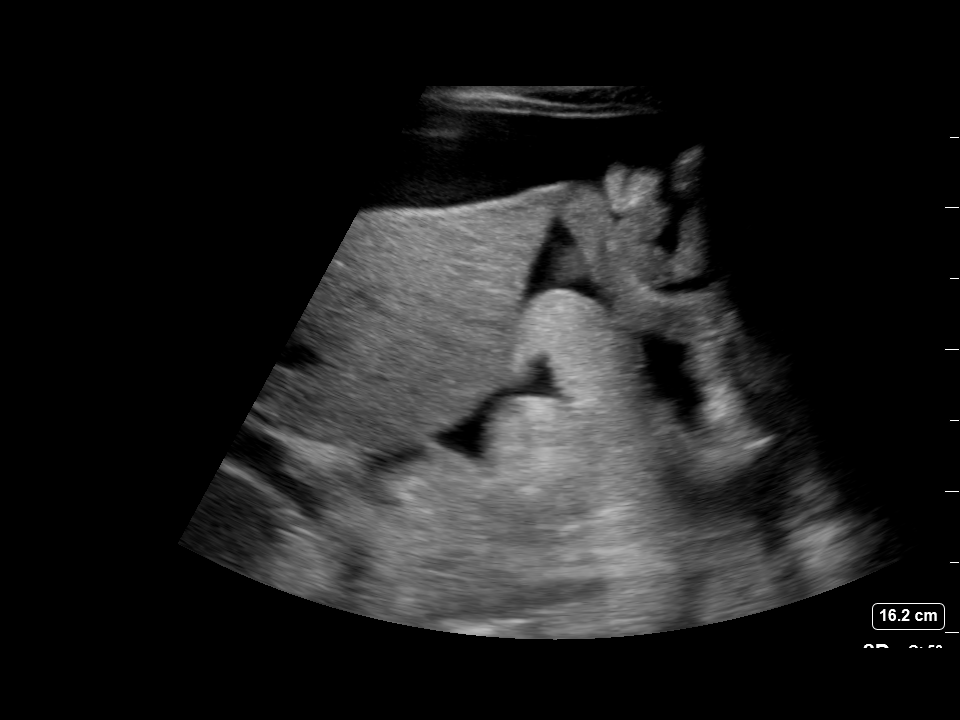
[im 2/7]
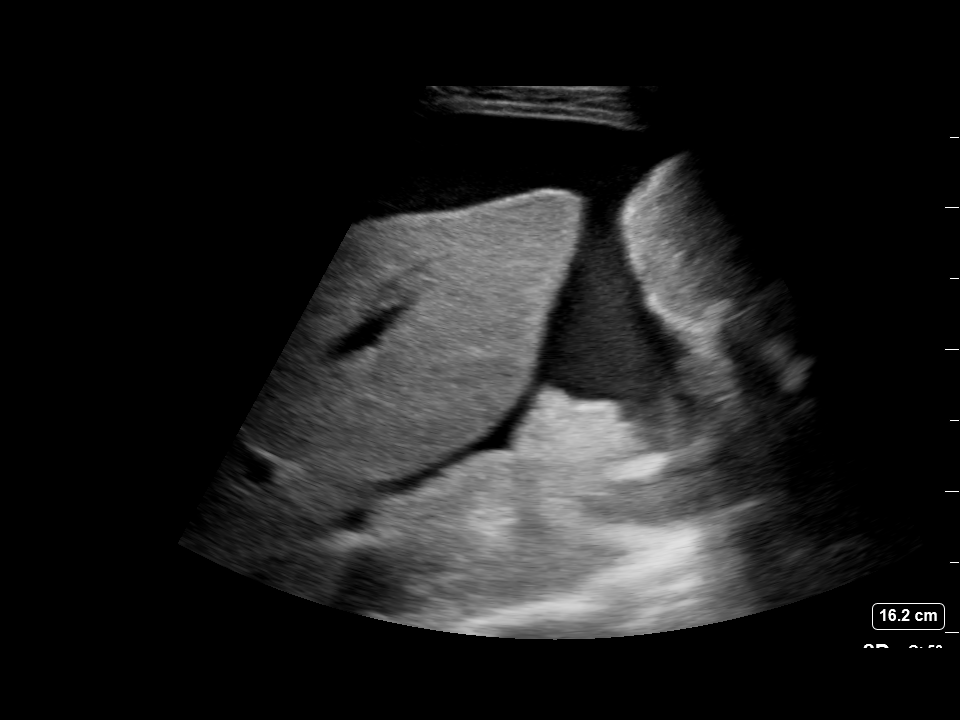
[im 3/7]
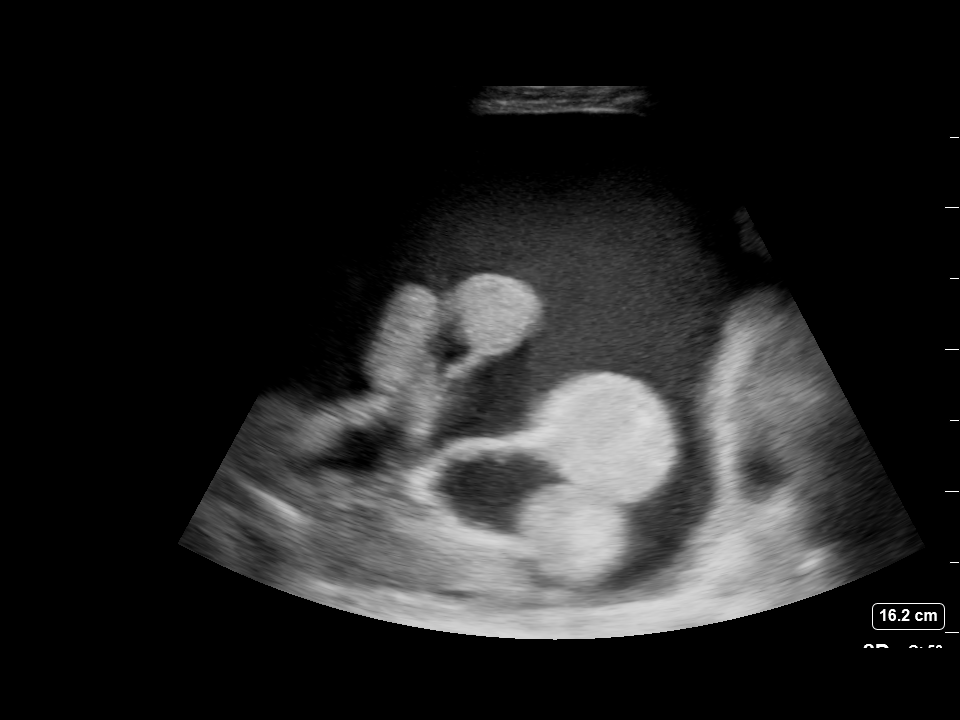
[im 4/7]
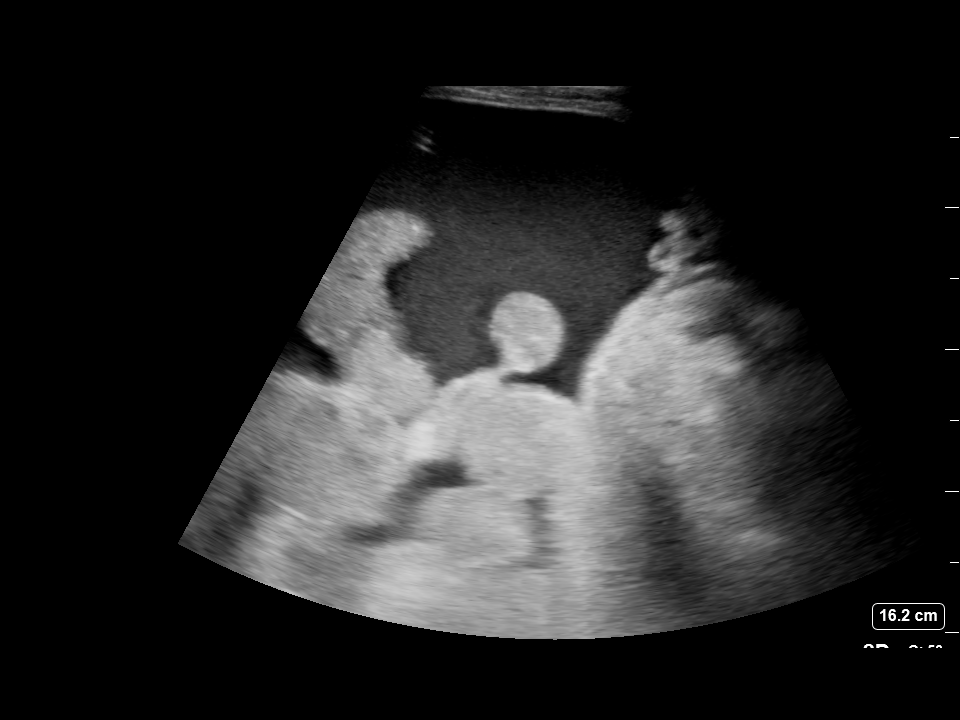
[im 5/7]
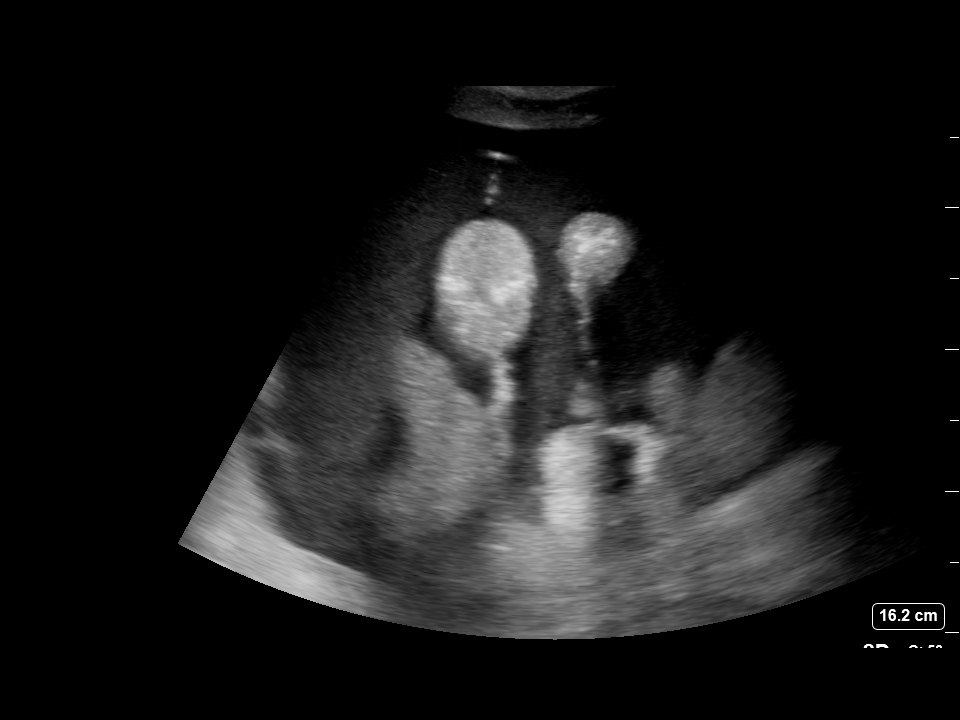
[im 6/7]
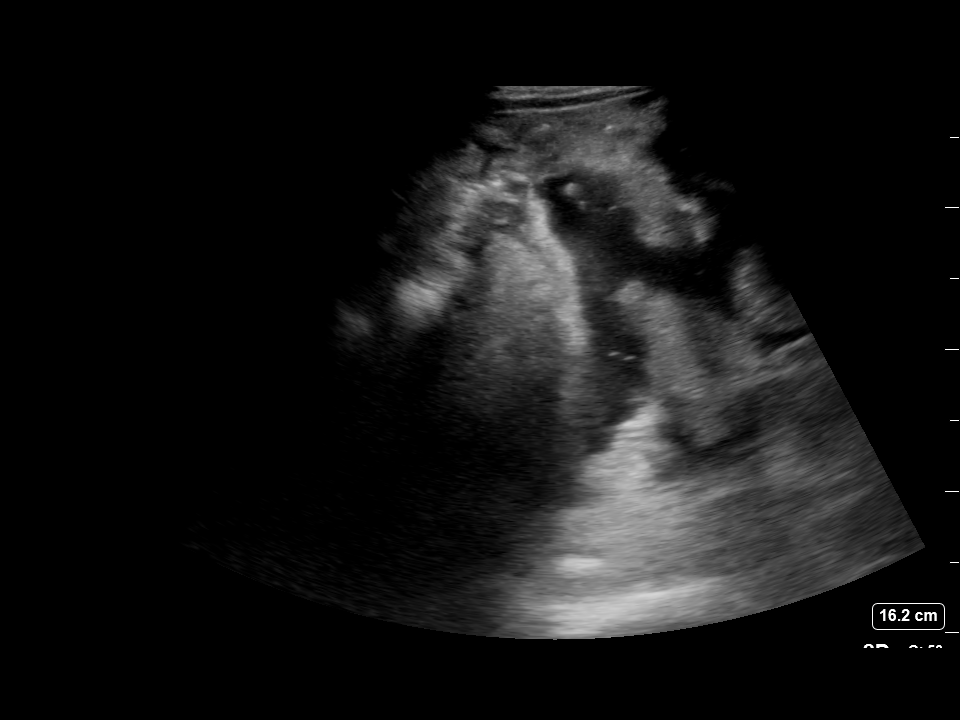
[im 7/7]
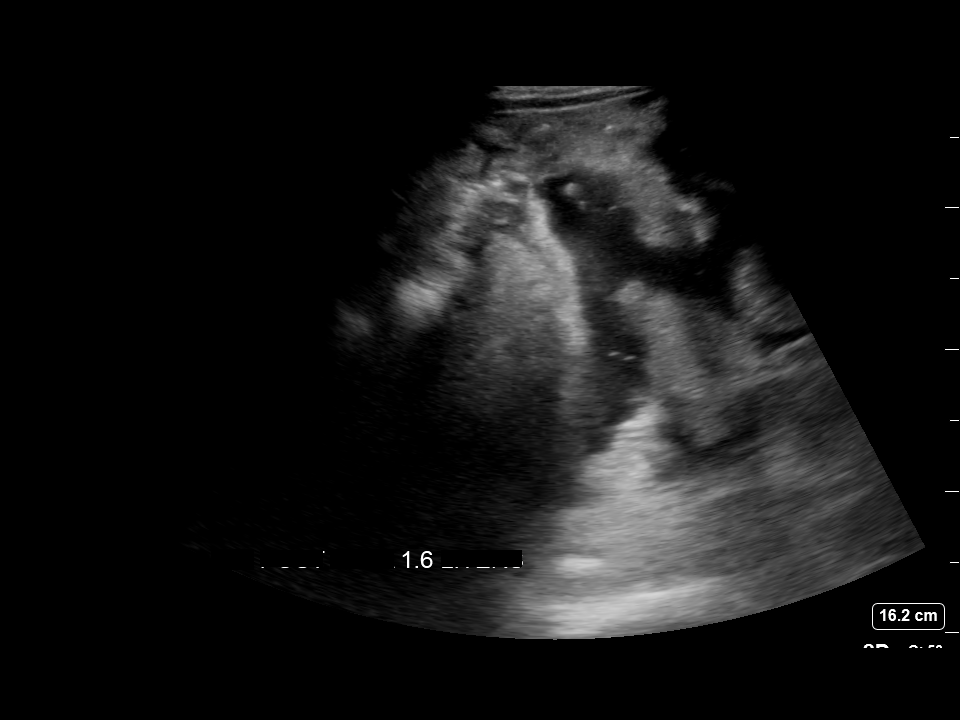

[7 of 7 positions shown; findings below may reference images not displayed]

EXAM:
ULTRASOUND GUIDED DIAGNOSTIC AND THERAPEUTIC PARACENTESIS

MEDICATIONS:
10 mL 1% lidocaine

COMPLICATIONS:
None immediate.

PROCEDURE:
Informed written consent was obtained from the patient after a
discussion of the risks, benefits and alternatives to treatment. A
timeout was performed prior to the initiation of the procedure.

Initial ultrasound scanning demonstrates a small amount of ascites
within the left lateral abdomen. The left lateral abdomen was
prepped and draped in the usual sterile fashion. 1% lidocaine was
used for local anesthesia.

Following this, a 19 gauge, 7-cm, Yueh catheter was introduced. An
ultrasound image was saved for documentation purposes. The
paracentesis was performed. The catheter was removed and a dressing
was applied. The patient tolerated the procedure well without
immediate post procedural complication.
FINDINGS: A total of approximately 1.6 liters of bloody fluid was removed.
Samples were sent to the laboratory as requested by the clinical
team.
IMPRESSION: Successful ultrasound-guided diagnostic and therapeutic paracentesis
yielding 1.6 liters of peritoneal fluid.

## 2019-04-02 ENCOUNTER — Other Ambulatory Visit (HOSPITAL_COMMUNITY): Payer: Medicare Other

## 2019-04-02 DIAGNOSIS — D631 Anemia in chronic kidney disease: Secondary | ICD-10-CM | POA: Diagnosis not present

## 2019-04-02 DIAGNOSIS — N186 End stage renal disease: Secondary | ICD-10-CM | POA: Diagnosis not present

## 2019-04-02 DIAGNOSIS — D509 Iron deficiency anemia, unspecified: Secondary | ICD-10-CM | POA: Diagnosis not present

## 2019-04-02 DIAGNOSIS — N2581 Secondary hyperparathyroidism of renal origin: Secondary | ICD-10-CM | POA: Diagnosis not present

## 2019-04-02 DIAGNOSIS — Z992 Dependence on renal dialysis: Secondary | ICD-10-CM | POA: Diagnosis not present

## 2019-04-03 ENCOUNTER — Ambulatory Visit (HOSPITAL_COMMUNITY)
Admission: RE | Admit: 2019-04-03 | Discharge: 2019-04-03 | Disposition: A | Discharge status: Home / Self Care | Payer: Medicare Other | Source: Ambulatory Visit | Attending: Internal Medicine | Admitting: Internal Medicine

## 2019-04-03 ENCOUNTER — Other Ambulatory Visit: Payer: Self-pay

## 2019-04-03 ENCOUNTER — Encounter (HOSPITAL_COMMUNITY): Payer: Self-pay | Admitting: Student

## 2019-04-03 ENCOUNTER — Ambulatory Visit (HOSPITAL_COMMUNITY): Payer: Medicare Other

## 2019-04-03 DIAGNOSIS — K7031 Alcoholic cirrhosis of liver with ascites: Secondary | ICD-10-CM | POA: Insufficient documentation

## 2019-04-03 HISTORY — PX: IR PARACENTESIS: IMG2679

## 2019-04-03 LAB — BODY FLUID CELL COUNT WITH DIFFERENTIAL
Eos, Fluid: 0 %
Lymphs, Fluid: 11 %
Monocyte-Macrophage-Serous Fluid: 89 % (ref 50–90)
Neutrophil Count, Fluid: 0 % (ref 0–25)
Total Nucleated Cell Count, Fluid: 210 cu mm (ref 0–1000)

## 2019-04-03 MED ORDER — LIDOCAINE HCL 1 % IJ SOLN
INTRAMUSCULAR | Status: AC
  Filled 2019-04-03: qty 20

## 2019-04-03 MED ORDER — ALBUMIN HUMAN 25 % IV SOLN
50.0000 g | Freq: Once | INTRAVENOUS | Status: DC
  Filled 2019-04-03: qty 200

## 2019-04-03 MED ORDER — LIDOCAINE HCL (PF) 1 % IJ SOLN
INTRAMUSCULAR | Status: AC | PRN
  Administered 2019-04-03: 10 mL

## 2019-04-03 NOTE — Procedures (Signed)
PROCEDURE SUMMARY:  Successful image-guided paracentesis from the right lateral abdomen.  Yielded 5.5 liters of hazy gold fluid.  No immediate complications.  EBL = 0 mL. Patient tolerated well.   Specimen was sent for labs.  Please see imaging section of Epic for full dictation.   Claris Pong Louk PA-C 04/03/2019 1:17 PM

## 2019-04-04 DIAGNOSIS — Z992 Dependence on renal dialysis: Secondary | ICD-10-CM | POA: Diagnosis not present

## 2019-04-04 DIAGNOSIS — N2581 Secondary hyperparathyroidism of renal origin: Secondary | ICD-10-CM | POA: Diagnosis not present

## 2019-04-04 DIAGNOSIS — N186 End stage renal disease: Secondary | ICD-10-CM | POA: Diagnosis not present

## 2019-04-04 DIAGNOSIS — D509 Iron deficiency anemia, unspecified: Secondary | ICD-10-CM | POA: Diagnosis not present

## 2019-04-04 DIAGNOSIS — D631 Anemia in chronic kidney disease: Secondary | ICD-10-CM | POA: Diagnosis not present

## 2019-04-04 LAB — PATHOLOGIST SMEAR REVIEW

## 2019-04-04 IMAGING — CR DG CHEST 2V
2 series · 2 of 2 positions shown · non-contrast
Comparison: Chest x-ray dated September 28, 2017.

CLINICAL DATA: Shortness of breath.

EXAM:
CHEST - 2 VIEW

[chest lat]
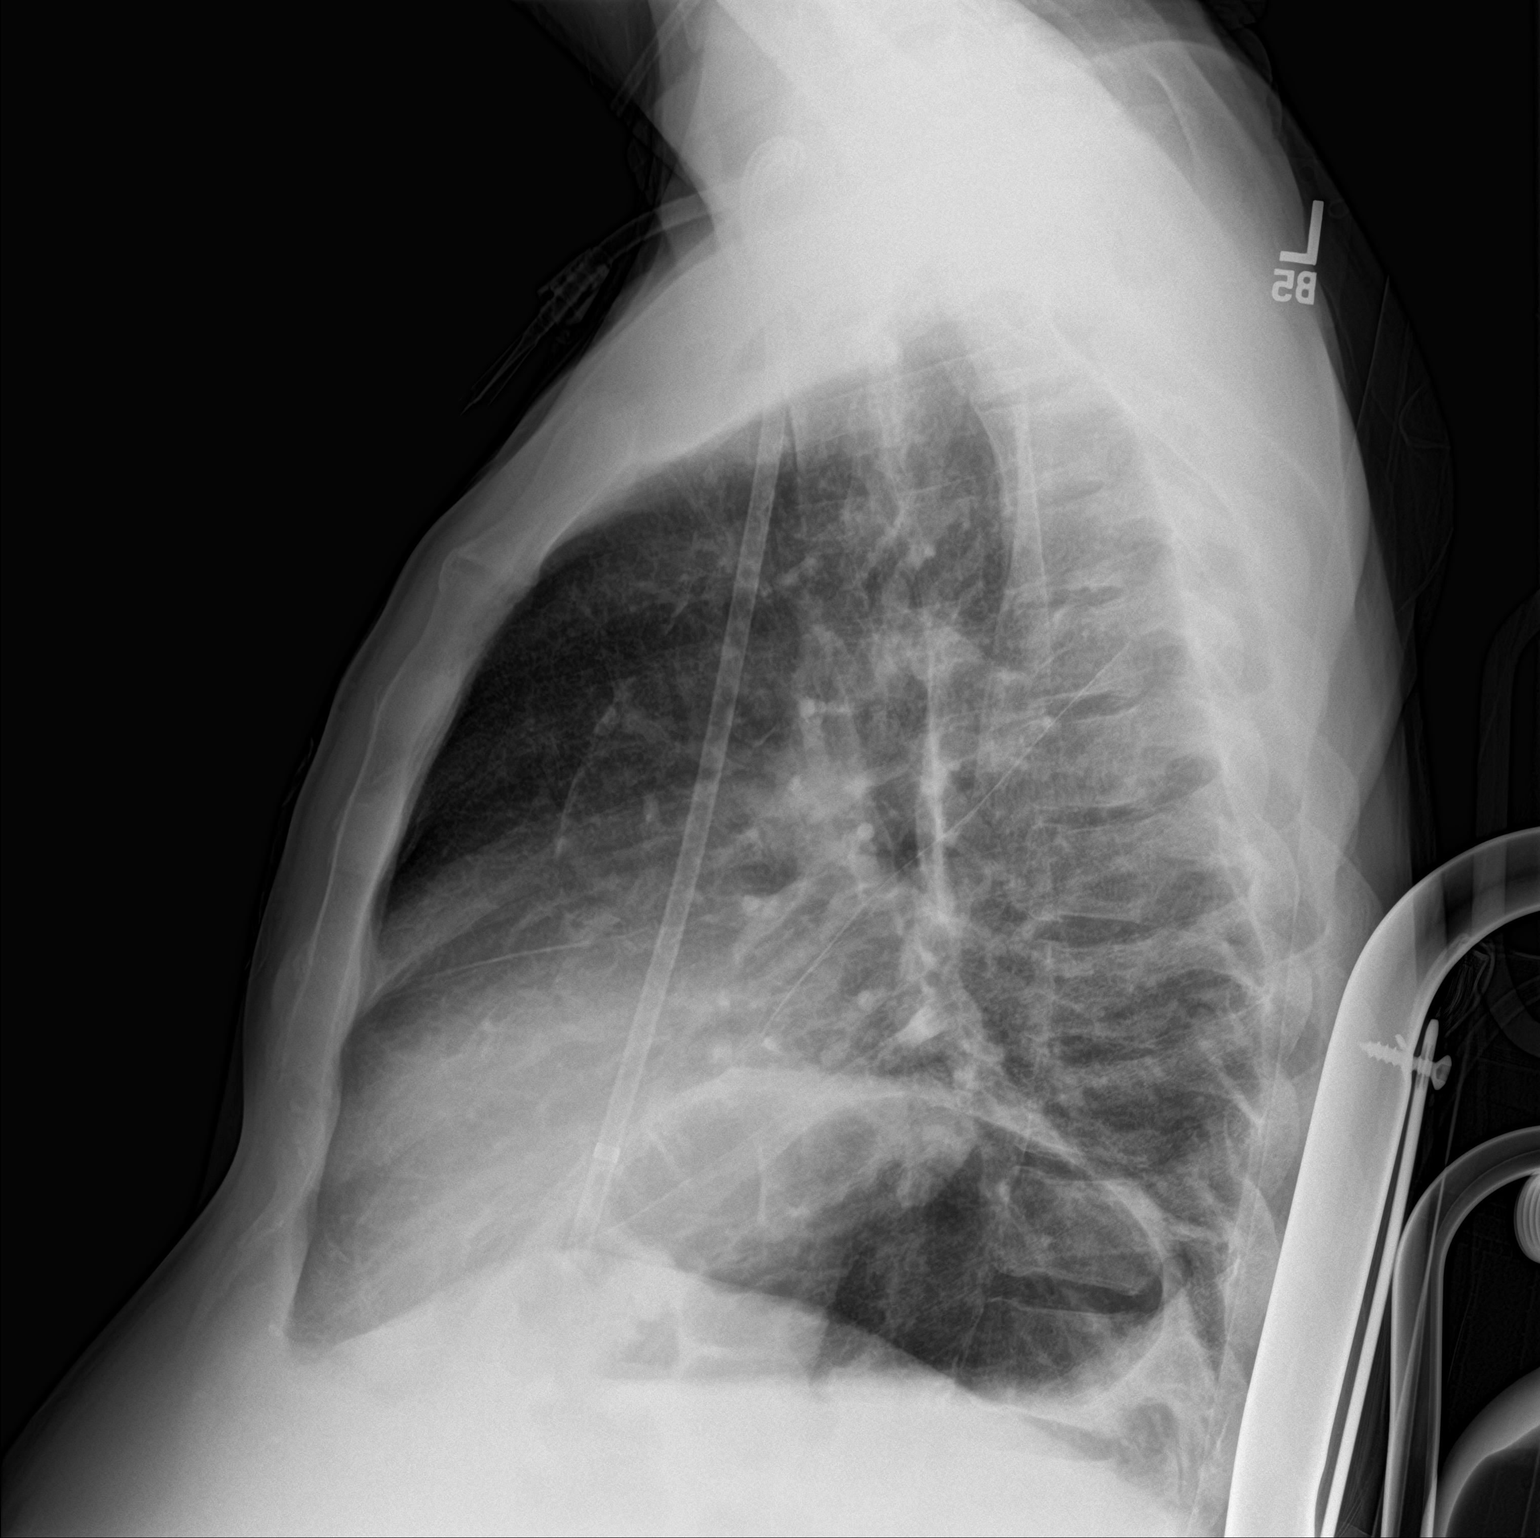

[chest ap]
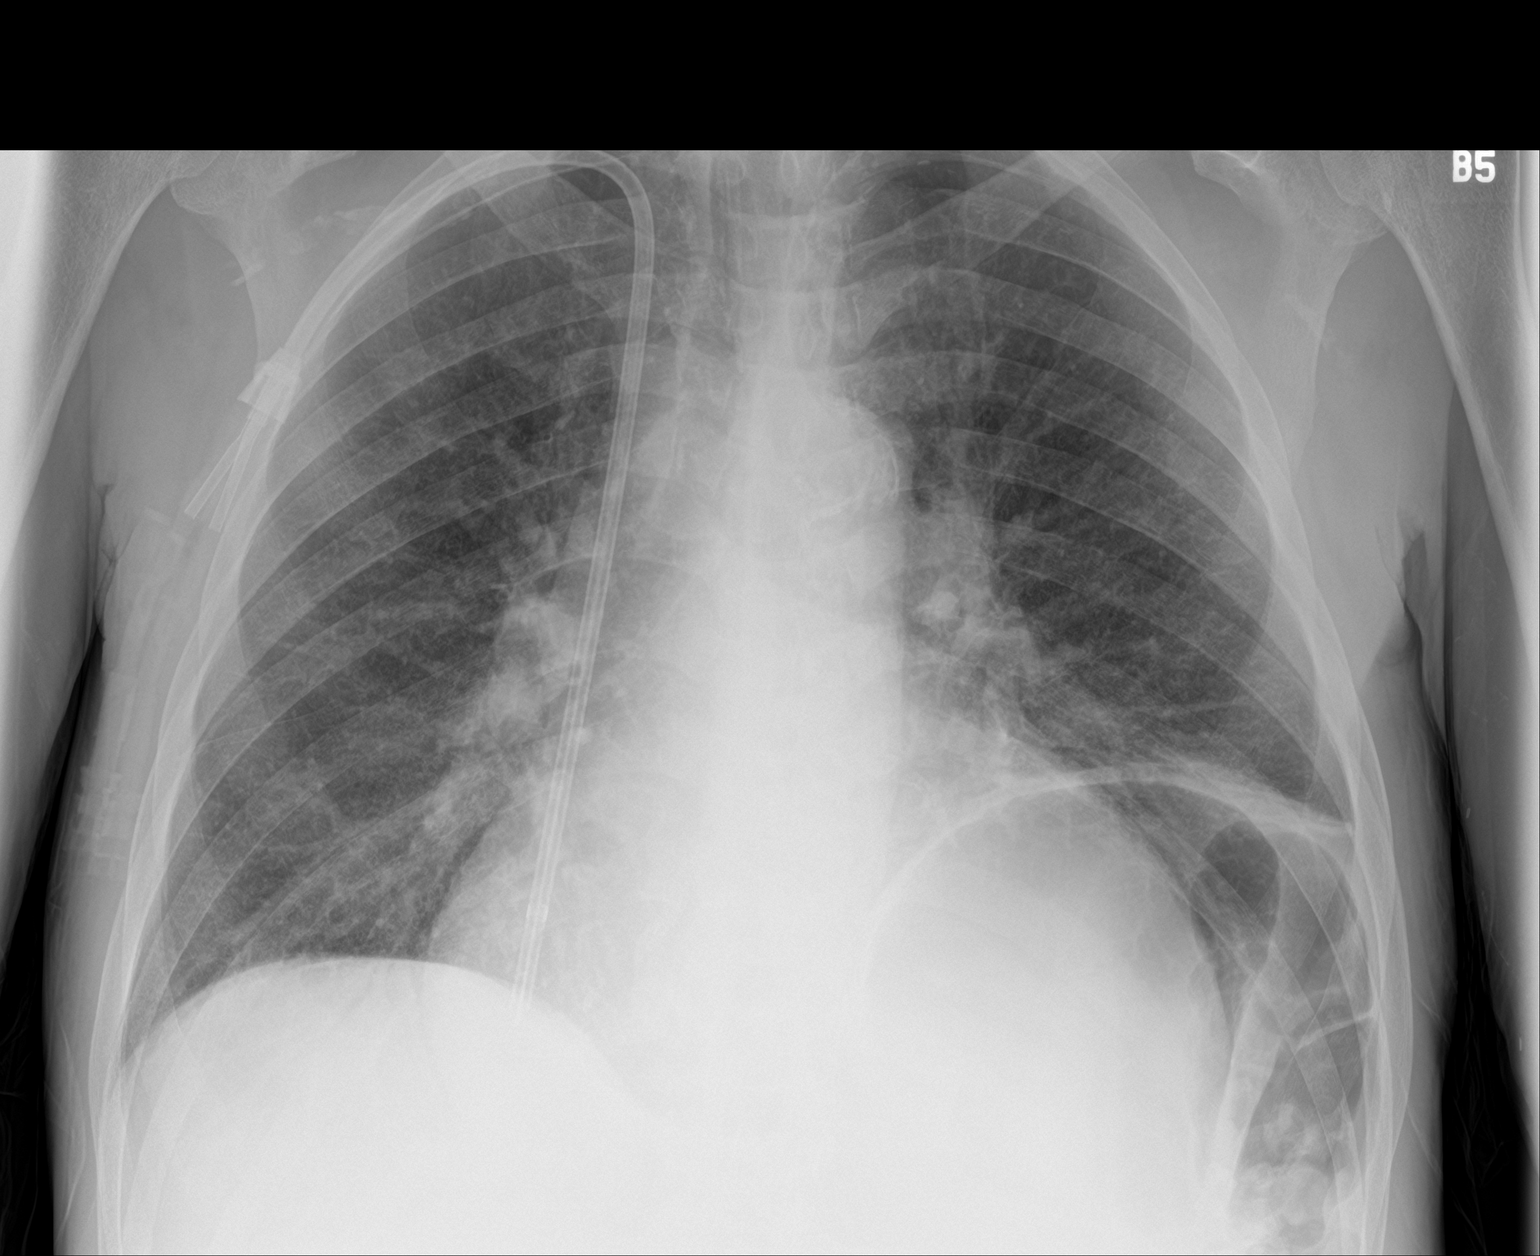

[2 of 2 positions shown; findings below may reference images not displayed]

FINDINGS: Unchanged tunneled right internal jugular dialysis catheter with the
tip in the right atrium. Stable cardiomegaly and mild pulmonary
vascular congestion. New increased elevation of the left
hemidiaphragm with adjacent left basilar atelectasis. No focal
consolidation, pleural effusion, or pneumothorax. No acute osseous
abnormality. Gaseous distention of the stomach.
IMPRESSION: 1. Stable cardiomegaly and mild pulmonary vascular congestion.
2. New increased elevation of the left hemidiaphragm with adjacent
left lower lobe atelectasis.

## 2019-04-06 ENCOUNTER — Other Ambulatory Visit: Payer: Self-pay

## 2019-04-06 ENCOUNTER — Encounter (HOSPITAL_COMMUNITY): Payer: Self-pay | Admitting: Emergency Medicine

## 2019-04-06 DIAGNOSIS — N2581 Secondary hyperparathyroidism of renal origin: Secondary | ICD-10-CM | POA: Diagnosis not present

## 2019-04-06 DIAGNOSIS — N186 End stage renal disease: Secondary | ICD-10-CM | POA: Diagnosis not present

## 2019-04-06 DIAGNOSIS — Z992 Dependence on renal dialysis: Secondary | ICD-10-CM | POA: Diagnosis not present

## 2019-04-06 DIAGNOSIS — D631 Anemia in chronic kidney disease: Secondary | ICD-10-CM | POA: Diagnosis not present

## 2019-04-06 DIAGNOSIS — D509 Iron deficiency anemia, unspecified: Secondary | ICD-10-CM | POA: Diagnosis not present

## 2019-04-06 NOTE — Progress Notes (Signed)
Pre-op endo call completed  

## 2019-04-07 ENCOUNTER — Other Ambulatory Visit (HOSPITAL_COMMUNITY)
Admission: RE | Admit: 2019-04-07 | Discharge: 2019-04-07 | Disposition: A | Discharge status: Home / Self Care | Payer: Medicare Other | Source: Ambulatory Visit | Attending: Internal Medicine | Admitting: Internal Medicine

## 2019-04-07 DIAGNOSIS — Z01812 Encounter for preprocedural laboratory examination: Secondary | ICD-10-CM | POA: Diagnosis not present

## 2019-04-07 DIAGNOSIS — Z20828 Contact with and (suspected) exposure to other viral communicable diseases: Secondary | ICD-10-CM | POA: Diagnosis not present

## 2019-04-08 LAB — NOVEL CORONAVIRUS, NAA (HOSP ORDER, SEND-OUT TO REF LAB; TAT 18-24 HRS): SARS-CoV-2, NAA: NOT DETECTED

## 2019-04-09 DIAGNOSIS — D509 Iron deficiency anemia, unspecified: Secondary | ICD-10-CM | POA: Diagnosis not present

## 2019-04-09 DIAGNOSIS — D631 Anemia in chronic kidney disease: Secondary | ICD-10-CM | POA: Diagnosis not present

## 2019-04-09 DIAGNOSIS — N186 End stage renal disease: Secondary | ICD-10-CM | POA: Diagnosis not present

## 2019-04-09 DIAGNOSIS — Z992 Dependence on renal dialysis: Secondary | ICD-10-CM | POA: Diagnosis not present

## 2019-04-09 DIAGNOSIS — N2581 Secondary hyperparathyroidism of renal origin: Secondary | ICD-10-CM | POA: Diagnosis not present

## 2019-04-10 ENCOUNTER — Telehealth: Payer: Self-pay | Admitting: Internal Medicine

## 2019-04-10 ENCOUNTER — Encounter (HOSPITAL_COMMUNITY)
Admission: RE | Disposition: A | Discharge status: Home / Self Care | Payer: Self-pay | Source: Home / Self Care | Attending: Internal Medicine

## 2019-04-10 ENCOUNTER — Ambulatory Visit (HOSPITAL_COMMUNITY)
Admission: RE | Admit: 2019-04-10 | Discharge: 2019-04-10 | Disposition: A | Discharge status: Home / Self Care | Payer: Medicare Other | Attending: Internal Medicine | Admitting: Internal Medicine

## 2019-04-10 ENCOUNTER — Encounter (HOSPITAL_COMMUNITY): Payer: Self-pay | Admitting: *Deleted

## 2019-04-10 ENCOUNTER — Ambulatory Visit (HOSPITAL_COMMUNITY): Payer: Medicare Other | Admitting: Certified Registered Nurse Anesthetist

## 2019-04-10 DIAGNOSIS — Z888 Allergy status to other drugs, medicaments and biological substances status: Secondary | ICD-10-CM | POA: Insufficient documentation

## 2019-04-10 DIAGNOSIS — I050 Rheumatic mitral stenosis: Secondary | ICD-10-CM | POA: Diagnosis not present

## 2019-04-10 DIAGNOSIS — K7460 Unspecified cirrhosis of liver: Secondary | ICD-10-CM | POA: Insufficient documentation

## 2019-04-10 DIAGNOSIS — F17210 Nicotine dependence, cigarettes, uncomplicated: Secondary | ICD-10-CM | POA: Diagnosis not present

## 2019-04-10 DIAGNOSIS — K766 Portal hypertension: Secondary | ICD-10-CM | POA: Insufficient documentation

## 2019-04-10 DIAGNOSIS — K3189 Other diseases of stomach and duodenum: Secondary | ICD-10-CM | POA: Insufficient documentation

## 2019-04-10 DIAGNOSIS — Z955 Presence of coronary angioplasty implant and graft: Secondary | ICD-10-CM | POA: Insufficient documentation

## 2019-04-10 DIAGNOSIS — I313 Pericardial effusion (noninflammatory): Secondary | ICD-10-CM | POA: Insufficient documentation

## 2019-04-10 DIAGNOSIS — J449 Chronic obstructive pulmonary disease, unspecified: Secondary | ICD-10-CM | POA: Diagnosis not present

## 2019-04-10 DIAGNOSIS — Z886 Allergy status to analgesic agent status: Secondary | ICD-10-CM | POA: Insufficient documentation

## 2019-04-10 DIAGNOSIS — Z8249 Family history of ischemic heart disease and other diseases of the circulatory system: Secondary | ICD-10-CM | POA: Insufficient documentation

## 2019-04-10 DIAGNOSIS — R188 Other ascites: Secondary | ICD-10-CM | POA: Insufficient documentation

## 2019-04-10 DIAGNOSIS — B1920 Unspecified viral hepatitis C without hepatic coma: Secondary | ICD-10-CM | POA: Insufficient documentation

## 2019-04-10 DIAGNOSIS — I120 Hypertensive chronic kidney disease with stage 5 chronic kidney disease or end stage renal disease: Secondary | ICD-10-CM | POA: Diagnosis not present

## 2019-04-10 DIAGNOSIS — Z885 Allergy status to narcotic agent status: Secondary | ICD-10-CM | POA: Insufficient documentation

## 2019-04-10 DIAGNOSIS — I2510 Atherosclerotic heart disease of native coronary artery without angina pectoris: Secondary | ICD-10-CM | POA: Diagnosis not present

## 2019-04-10 DIAGNOSIS — I4891 Unspecified atrial fibrillation: Secondary | ICD-10-CM | POA: Diagnosis not present

## 2019-04-10 DIAGNOSIS — I252 Old myocardial infarction: Secondary | ICD-10-CM | POA: Insufficient documentation

## 2019-04-10 DIAGNOSIS — K921 Melena: Secondary | ICD-10-CM

## 2019-04-10 DIAGNOSIS — K2970 Gastritis, unspecified, without bleeding: Secondary | ICD-10-CM | POA: Insufficient documentation

## 2019-04-10 DIAGNOSIS — Z79899 Other long term (current) drug therapy: Secondary | ICD-10-CM | POA: Insufficient documentation

## 2019-04-10 DIAGNOSIS — F419 Anxiety disorder, unspecified: Secondary | ICD-10-CM | POA: Insufficient documentation

## 2019-04-10 DIAGNOSIS — Z992 Dependence on renal dialysis: Secondary | ICD-10-CM | POA: Diagnosis not present

## 2019-04-10 DIAGNOSIS — B181 Chronic viral hepatitis B without delta-agent: Secondary | ICD-10-CM | POA: Diagnosis not present

## 2019-04-10 DIAGNOSIS — K219 Gastro-esophageal reflux disease without esophagitis: Secondary | ICD-10-CM | POA: Insufficient documentation

## 2019-04-10 DIAGNOSIS — K449 Diaphragmatic hernia without obstruction or gangrene: Secondary | ICD-10-CM | POA: Insufficient documentation

## 2019-04-10 DIAGNOSIS — M19012 Primary osteoarthritis, left shoulder: Secondary | ICD-10-CM | POA: Insufficient documentation

## 2019-04-10 DIAGNOSIS — N186 End stage renal disease: Secondary | ICD-10-CM | POA: Insufficient documentation

## 2019-04-10 HISTORY — PX: BIOPSY: SHX5522

## 2019-04-10 HISTORY — PX: ESOPHAGOGASTRODUODENOSCOPY (EGD) WITH PROPOFOL: SHX5813

## 2019-04-10 SURGERY — ESOPHAGOGASTRODUODENOSCOPY (EGD) WITH PROPOFOL
Anesthesia: Monitor Anesthesia Care

## 2019-04-10 MED ORDER — PHENYLEPHRINE 40 MCG/ML (10ML) SYRINGE FOR IV PUSH (FOR BLOOD PRESSURE SUPPORT)
PREFILLED_SYRINGE | INTRAVENOUS | Status: DC | PRN
  Administered 2019-04-10 (×2): 80 ug via INTRAVENOUS

## 2019-04-10 MED ORDER — PROPOFOL 10 MG/ML IV BOLUS
INTRAVENOUS | Status: AC
  Filled 2019-04-10: qty 40

## 2019-04-10 MED ORDER — SODIUM CHLORIDE 0.9 % IV SOLN
INTRAVENOUS | Status: DC
  Administered 2019-04-10: 12:00:00 via INTRAVENOUS

## 2019-04-10 MED ORDER — LIDOCAINE 2% (20 MG/ML) 5 ML SYRINGE
INTRAMUSCULAR | Status: DC | PRN
  Administered 2019-04-10: 80 mg via INTRAVENOUS

## 2019-04-10 MED ORDER — PROPOFOL 10 MG/ML IV BOLUS
INTRAVENOUS | Status: DC | PRN
  Administered 2019-04-10: 30 mg via INTRAVENOUS

## 2019-04-10 MED ORDER — PROPOFOL 500 MG/50ML IV EMUL
INTRAVENOUS | Status: DC | PRN
  Administered 2019-04-10: 150 ug/kg/min via INTRAVENOUS

## 2019-04-10 SURGICAL SUPPLY — 15 items

## 2019-04-10 NOTE — Anesthesia Preprocedure Evaluation (Signed)
Anesthesia Evaluation  Patient identified by MRN, date of birth, ID band Patient awake    Reviewed: Allergy & Precautions, NPO status , Patient's Chart, lab work & pertinent test results  Airway Mallampati: II  TM Distance: >3 FB Neck ROM: Full    Dental no notable dental hx.    Pulmonary neg pulmonary ROS, Current Smoker,    Pulmonary exam normal breath sounds clear to auscultation       Cardiovascular hypertension, + CAD, + Past MI, + Cardiac Stents and + Peripheral Vascular Disease  Normal cardiovascular exam+ dysrhythmias  Rhythm:Regular Rate:Normal     Neuro/Psych negative neurological ROS  negative psych ROS   GI/Hepatic PUD, GERD  ,(+)     substance abuse  , Hepatitis -  Endo/Other  negative endocrine ROS  Renal/GU DialysisRenal diseaseK 6.1 01/24/18 at 1500  negative genitourinary   Musculoskeletal negative musculoskeletal ROS (+)   Abdominal Normal abdominal exam  (+)   Peds negative pediatric ROS (+)  Hematology negative hematology ROS (+)   Anesthesia Other Findings   Reproductive/Obstetrics negative OB ROS                             Anesthesia Physical  Anesthesia Plan  ASA: IV  Anesthesia Plan: MAC   Post-op Pain Management:    Induction:   PONV Risk Score and Plan: Propofol infusion  Airway Management Planned: Simple Face Mask, Nasal Cannula and Natural Airway  Additional Equipment:   Intra-op Plan:   Post-operative Plan:   Informed Consent: I have reviewed the patients History and Physical, chart, labs and discussed the procedure including the risks, benefits and alternatives for the proposed anesthesia with the patient or authorized representative who has indicated his/her understanding and acceptance.     Dental advisory given  Plan Discussed with: CRNA  Anesthesia Plan Comments: (Awaiting istat potassium)        Anesthesia Quick  Evaluation

## 2019-04-10 NOTE — Discharge Instructions (Signed)
YOU HAD AN ENDOSCOPIC PROCEDURE TODAY: Refer to the procedure report and other information in the discharge instructions given to you for any specific questions about what was found during the examination. If this information does not answer your questions, please call [place] office at [phone] to clarify.  ° °YOU SHOULD EXPECT: Some feelings of bloating in the abdomen. Passage of more gas than usual. Walking can help get rid of the air that was put into your GI tract during the procedure and reduce the bloating. If you had a lower endoscopy (such as a colonoscopy or flexible sigmoidoscopy) you may notice spotting of blood in your stool or on the toilet paper. Some abdominal soreness may be present for a day or two, also. ° °DIET: Your first meal following the procedure should be a light meal and then it is ok to progress to your normal diet. A half-sandwich or bowl of soup is an example of a good first meal. Heavy or fried foods are harder to digest and may make you feel nauseous or bloated. Drink plenty of fluids but you should avoid alcoholic beverages for 24 hours. If you had a esophageal dilation, please see attached instructions for diet.   ° °ACTIVITY: Your care partner should take you home directly after the procedure. You should plan to take it easy, moving slowly for the rest of the day. You can resume normal activity the day after the procedure however YOU SHOULD NOT DRIVE, use power tools, machinery or perform tasks that involve climbing or major physical exertion for 24 hours (because of the sedation medicines used during the test).  ° °SYMPTOMS TO REPORT IMMEDIATELY: °A gastroenterologist can be reached at any hour. Please call [phone]  for any of the following symptoms:  °Following lower endoscopy (colonoscopy, flexible sigmoidoscopy) °Excessive amounts of blood in the stool  °Significant tenderness, worsening of abdominal pains  °Swelling of the abdomen that is new, acute  °Fever of 100° or  higher  °Following upper endoscopy (EGD, EUS, ERCP, esophageal dilation) °Vomiting of blood or coffee ground material  °New, significant abdominal pain  °New, significant chest pain or pain under the shoulder blades  °Painful or persistently difficult swallowing  °New shortness of breath  °Black, tarry-looking or red, bloody stools ° °FOLLOW UP:  °If any biopsies were taken you will be contacted by phone or by letter within the next 1-3 weeks. Call [phone]  if you have not heard about the biopsies in 3 weeks.  °Please also call with any specific questions about appointments or follow up tests. ° °

## 2019-04-10 NOTE — Interval H&P Note (Signed)
History and Physical Interval Note: For EGD today to eval recent melena.  Cirrhosis patient.  ESRD. HIGHER THAN BASELINE RISK.The nature of the procedure, as well as the risks, benefits, and alternatives were carefully and thoroughly reviewed with the patient. Ample time for discussion and questions allowed. The patient understood, was satisfied, and agreed to proceed.   CBC Latest Ref Rng & Units 03/20/2019 03/19/2019 03/08/2019  WBC 4.0 - 10.5 K/uL 9.8 9.9 9.0  Hemoglobin 13.0 - 17.0 g/dL 11.8(L) 11.5(L) 11.3(L)  Hematocrit 39.0 - 52.0 % 32.8(L) 32.9(L) 31.9(L)  Platelets 150 - 400 K/uL 200 191 171   Lab Results  Component Value Date   INR 1.3 (H) 03/08/2019   INR 1.1 02/19/2019   INR 1.2 02/11/2019      04/10/2019 12:33 PM  [name]  has presented today for surgery, with the diagnosis of black stool, cirrhosis.  The various methods of treatment have been discussed with the patient and family. After consideration of risks, benefits and other options for treatment, the patient has consented to  Procedure(s): ESOPHAGOGASTRODUODENOSCOPY (EGD) WITH PROPOFOL (N/A) as a surgical intervention.  The patient's history has been reviewed, patient examined, no change in status, stable for surgery.  I have reviewed the patient's chart and labs.  Questions were answered to the patient's satisfaction.     Lajuan Lines Dexton Zwilling

## 2019-04-10 NOTE — Anesthesia Postprocedure Evaluation (Signed)
Anesthesia Post Note  Patient: [name]  Procedure(s) Performed: ESOPHAGOGASTRODUODENOSCOPY (EGD) WITH PROPOFOL (N/A ) BIOPSY     Patient location during evaluation: Endoscopy Anesthesia Type: MAC Level of consciousness: awake Pain management: pain level controlled Vital Signs Assessment: post-procedure vital signs reviewed and stable Respiratory status: spontaneous breathing Cardiovascular status: stable Postop Assessment: no apparent nausea or vomiting Anesthetic complications: no    Last Vitals:  Vitals:   04/10/19 1323 04/10/19 1330  BP: 113/84 93/71  Pulse: 95 (!) 51  Resp: (!) 21 20  Temp:    SpO2: 95% 97%    Last Pain:  Vitals:   04/10/19 1308  TempSrc: Temporal  PainSc:    Pain Goal:                   Huston Foley

## 2019-04-10 NOTE — Telephone Encounter (Signed)
Pt had a procedure today at [facility] and stated that he had spoken "to someone here about something to do with [name]."

## 2019-04-10 NOTE — Transfer of Care (Signed)
Immediate Anesthesia Transfer of Care Note  Patient: [name]  Procedure(s) Performed: ESOPHAGOGASTRODUODENOSCOPY (EGD) WITH PROPOFOL (N/A ) BIOPSY  Patient Location: Endoscopy Unit  Anesthesia Type:MAC  Level of Consciousness: drowsy and responds to stimulation  Airway & Oxygen Therapy: Patient Spontanous Breathing and Patient connected to face mask oxygen  Post-op Assessment: Report given to RN, Post -op Vital signs reviewed and stable and Patient moving all extremities  Post vital signs: Reviewed and stable  Last Vitals:  Vitals Value Taken Time  BP    Temp    Pulse 110 04/10/19 1309  Resp 17 04/10/19 1309  SpO2 100 % 04/10/19 1309  Vitals shown include unvalidated device data.  Last Pain:  Vitals:   04/10/19 1107  TempSrc: Oral  PainSc: 0-No pain         Complications: No apparent anesthesia complications

## 2019-04-10 NOTE — Op Note (Signed)
[facility] Patient Name: [name] Procedure Date: 04/10/2019 MRN: [id number] Attending MD: Jerene Bears , MD Date of Birth: 10-06-1960 CSN: [id number] Age: 58 Admit Type: Outpatient Procedure:                Upper GI endoscopy Indications:              Melena, Cirrhosis rule out esophageal varices Providers:                Lajuan Lines. Hilarie Fredrickson, MD, Josie Dixon, RN, Grace Isaac, RN, William Dalton, Technician Referring MD:              Medicines:                Monitored Anesthesia Care Complications:            No immediate complications. Estimated Blood Loss:     Estimated blood loss was minimal. Procedure:                Pre-Anesthesia Assessment:                           - Prior to the procedure, a History and Physical                            was performed, and patient medications and                            allergies were reviewed. The patient's tolerance of                            previous anesthesia was also reviewed. The risks                            and benefits of the procedure and the sedation                            options and risks were discussed with the patient.                            All questions were answered, and informed consent                            was obtained. Prior Anticoagulants: The patient has                            taken no previous anticoagulant or antiplatelet                            agents. ASA Grade Assessment: III - A patient with                            severe systemic disease. After reviewing the risks  and benefits, the patient was deemed in                            satisfactory condition to undergo the procedure.                           After obtaining informed consent, the endoscope was                            passed under direct vision. Throughout the                            procedure, the patient's blood pressure, pulse, and                            oxygen saturations were monitored continuously. The                            GIF-H190 ([id number] Olympus gastroscope was                            introduced through the mouth, and advanced to the                            second part of duodenum. The upper GI endoscopy was                            accomplished without difficulty. The patient                            tolerated the procedure well. Scope In: Scope Out: Findings:      Normal mucosa was found in the entire esophagus.      A 2 cm hiatal hernia was present.      There is no endoscopic evidence of varices in the entire esophagus.      Mild portal hypertensive gastropathy was found in the cardia, in the       gastric fundus and in the gastric body. Nonbleeding today.      Localized mild inflammation characterized by erythema and slightly       nodularity was found in the gastric antrum and in the prepyloric region       of the stomach. Biopsies were taken with a cold forceps for histology       and Helicobacter pylori testing (body, antrum, incisura).      There is no endoscopic evidence of varices in the gastroesophageal       junction, in the cardia and in the gastric fundus.      The examined duodenum was normal. Impression:               - Normal mucosa was found in the entire esophagus.                           - 2 cm hiatal hernia.                           -  Mild portal hypertensive gastropathy (nonbleeding                            at this time).                           - Gastritis (nonbleeding). Biopsied.                           - Normal examined duodenum. Moderate Sedation:      N/A Recommendation:           - Patient has a contact number available for                            emergencies. The signs and symptoms of potential                            delayed complications were discussed with the                            patient. Return to normal activities tomorrow.                             Written discharge instructions were provided to the                            patient.                           - Resume previous diet.                           - Continue present medications including daily PPI                            (pantoprazole 40 mg once daily).                           - Await pathology results.                           - Repeat upper endoscopy for screening purposes in                            2 years (cancel any previous recall for EGD). Procedure Code(s):        --- Professional ---                           [phone], Esophagogastroduodenoscopy, flexible,                            transoral; with biopsy, single or multiple Diagnosis Code(s):        --- Professional ---                           K44.9, Diaphragmatic hernia without obstruction or  gangrene                           K76.6, Portal hypertension                           K31.89, Other diseases of stomach and duodenum                           K29.70, Gastritis, unspecified, without bleeding                           K92.1, Melena (includes Hematochezia)                           K74.60, Unspecified cirrhosis of liver CPT copyright 2019 [employer]. All rights reserved. The codes documented in this report are preliminary and upon coder review may  be revised to meet current compliance requirements. Jerene Bears, MD 04/10/2019 1:12:28 PM This report has been signed electronically. Number of Addenda: 0

## 2019-04-10 NOTE — Telephone Encounter (Signed)
Reviewed with pt that he has appt at [facility] 04/12/19@1pm . He reports he knows about that appt.

## 2019-04-11 ENCOUNTER — Encounter (HOSPITAL_COMMUNITY): Payer: Self-pay | Admitting: Internal Medicine

## 2019-04-11 ENCOUNTER — Other Ambulatory Visit: Payer: Self-pay | Admitting: Internal Medicine

## 2019-04-11 DIAGNOSIS — Z992 Dependence on renal dialysis: Secondary | ICD-10-CM | POA: Diagnosis not present

## 2019-04-11 DIAGNOSIS — N186 End stage renal disease: Secondary | ICD-10-CM | POA: Diagnosis not present

## 2019-04-11 DIAGNOSIS — D509 Iron deficiency anemia, unspecified: Secondary | ICD-10-CM | POA: Diagnosis not present

## 2019-04-11 DIAGNOSIS — N2581 Secondary hyperparathyroidism of renal origin: Secondary | ICD-10-CM | POA: Diagnosis not present

## 2019-04-11 DIAGNOSIS — D631 Anemia in chronic kidney disease: Secondary | ICD-10-CM | POA: Diagnosis not present

## 2019-04-11 LAB — SURGICAL PATHOLOGY

## 2019-04-11 LAB — POCT I-STAT, CHEM 8
BUN: 22 mg/dL — ABNORMAL HIGH (ref 6–20)
Calcium, Ion: 1.08 mmol/L — ABNORMAL LOW (ref 1.15–1.40)
Chloride: 93 mmol/L — ABNORMAL LOW (ref 98–111)
Creatinine, Ser: 5 mg/dL — ABNORMAL HIGH (ref 0.61–1.24)
Glucose, Bld: 86 mg/dL (ref 70–99)
HCT: 33 % — ABNORMAL LOW (ref 39.0–52.0)
Hemoglobin: 11.2 g/dL — ABNORMAL LOW (ref 13.0–17.0)
Potassium: 4.5 mmol/L (ref 3.5–5.1)
Sodium: 136 mmol/L (ref 135–145)
TCO2: 30 mmol/L (ref 22–32)

## 2019-04-11 NOTE — Progress Notes (Signed)
Addendum: Reviewed and agree with assessment and management plan. Jamarie Joplin M, MD  

## 2019-04-12 ENCOUNTER — Telehealth: Payer: Self-pay | Admitting: Hospice

## 2019-04-12 ENCOUNTER — Other Ambulatory Visit: Payer: Self-pay

## 2019-04-12 ENCOUNTER — Ambulatory Visit (HOSPITAL_COMMUNITY)
Admission: RE | Admit: 2019-04-12 | Discharge: 2019-04-12 | Disposition: A | Discharge status: Home / Self Care | Payer: Medicare Other | Source: Ambulatory Visit | Attending: Internal Medicine | Admitting: Internal Medicine

## 2019-04-12 ENCOUNTER — Encounter (HOSPITAL_COMMUNITY): Payer: Self-pay | Admitting: Radiology

## 2019-04-12 DIAGNOSIS — N2581 Secondary hyperparathyroidism of renal origin: Secondary | ICD-10-CM | POA: Diagnosis not present

## 2019-04-12 DIAGNOSIS — K7031 Alcoholic cirrhosis of liver with ascites: Secondary | ICD-10-CM | POA: Diagnosis not present

## 2019-04-12 DIAGNOSIS — R188 Other ascites: Secondary | ICD-10-CM | POA: Insufficient documentation

## 2019-04-12 DIAGNOSIS — Z992 Dependence on renal dialysis: Secondary | ICD-10-CM | POA: Diagnosis not present

## 2019-04-12 DIAGNOSIS — D631 Anemia in chronic kidney disease: Secondary | ICD-10-CM | POA: Diagnosis not present

## 2019-04-12 DIAGNOSIS — D509 Iron deficiency anemia, unspecified: Secondary | ICD-10-CM | POA: Diagnosis not present

## 2019-04-12 DIAGNOSIS — I120 Hypertensive chronic kidney disease with stage 5 chronic kidney disease or end stage renal disease: Secondary | ICD-10-CM | POA: Diagnosis not present

## 2019-04-12 DIAGNOSIS — N186 End stage renal disease: Secondary | ICD-10-CM | POA: Diagnosis not present

## 2019-04-12 HISTORY — PX: IR PARACENTESIS: IMG2679

## 2019-04-12 LAB — BODY FLUID CELL COUNT WITH DIFFERENTIAL
Eos, Fluid: 0 %
Lymphs, Fluid: 17 %
Monocyte-Macrophage-Serous Fluid: 82 % (ref 50–90)
Neutrophil Count, Fluid: 1 % (ref 0–25)
Total Nucleated Cell Count, Fluid: 180 cu mm (ref 0–1000)

## 2019-04-12 MED ORDER — LIDOCAINE HCL 1 % IJ SOLN
INTRAMUSCULAR | Status: AC
  Filled 2019-04-12: qty 20

## 2019-04-12 NOTE — Telephone Encounter (Signed)
Called patient back and I have scheduled a Telephone Palliative f/u visit for 04/17/19 @ 3:30 PM.

## 2019-04-12 NOTE — Telephone Encounter (Signed)
Rec'd call from patient wanting to schedule a Palliative f/u visit.  In speaking with the patient he stated that he was in route to the hospital to have a procedure and I asked him if he wanted me to call him back and he requested I call him back at 4 pm today.

## 2019-04-13 DIAGNOSIS — N2581 Secondary hyperparathyroidism of renal origin: Secondary | ICD-10-CM | POA: Diagnosis not present

## 2019-04-13 DIAGNOSIS — D631 Anemia in chronic kidney disease: Secondary | ICD-10-CM | POA: Diagnosis not present

## 2019-04-13 DIAGNOSIS — N186 End stage renal disease: Secondary | ICD-10-CM | POA: Diagnosis not present

## 2019-04-13 DIAGNOSIS — Z992 Dependence on renal dialysis: Secondary | ICD-10-CM | POA: Diagnosis not present

## 2019-04-13 DIAGNOSIS — D509 Iron deficiency anemia, unspecified: Secondary | ICD-10-CM | POA: Diagnosis not present

## 2019-04-13 LAB — PATHOLOGIST SMEAR REVIEW

## 2019-04-14 ENCOUNTER — Other Ambulatory Visit: Payer: Self-pay | Admitting: Internal Medicine

## 2019-04-15 ENCOUNTER — Emergency Department (HOSPITAL_COMMUNITY)
Admission: EM | Admit: 2019-04-15 | Discharge: 2019-04-16 | Disposition: A | Discharge status: Home / Self Care | Payer: Medicare Other | Attending: Emergency Medicine | Admitting: Emergency Medicine

## 2019-04-15 DIAGNOSIS — Z992 Dependence on renal dialysis: Secondary | ICD-10-CM | POA: Insufficient documentation

## 2019-04-15 DIAGNOSIS — Z79899 Other long term (current) drug therapy: Secondary | ICD-10-CM | POA: Diagnosis not present

## 2019-04-15 DIAGNOSIS — B182 Chronic viral hepatitis C: Secondary | ICD-10-CM | POA: Insufficient documentation

## 2019-04-15 DIAGNOSIS — F17210 Nicotine dependence, cigarettes, uncomplicated: Secondary | ICD-10-CM | POA: Insufficient documentation

## 2019-04-15 DIAGNOSIS — I447 Left bundle-branch block, unspecified: Secondary | ICD-10-CM | POA: Diagnosis not present

## 2019-04-15 DIAGNOSIS — Z7689 Persons encountering health services in other specified circumstances: Secondary | ICD-10-CM

## 2019-04-15 DIAGNOSIS — K7469 Other cirrhosis of liver: Secondary | ICD-10-CM | POA: Diagnosis not present

## 2019-04-15 DIAGNOSIS — B181 Chronic viral hepatitis B without delta-agent: Secondary | ICD-10-CM | POA: Diagnosis not present

## 2019-04-15 DIAGNOSIS — R0602 Shortness of breath: Secondary | ICD-10-CM | POA: Diagnosis not present

## 2019-04-15 DIAGNOSIS — I120 Hypertensive chronic kidney disease with stage 5 chronic kidney disease or end stage renal disease: Secondary | ICD-10-CM | POA: Diagnosis not present

## 2019-04-15 DIAGNOSIS — N186 End stage renal disease: Secondary | ICD-10-CM | POA: Insufficient documentation

## 2019-04-15 DIAGNOSIS — J449 Chronic obstructive pulmonary disease, unspecified: Secondary | ICD-10-CM | POA: Diagnosis not present

## 2019-04-15 DIAGNOSIS — R1084 Generalized abdominal pain: Secondary | ICD-10-CM | POA: Diagnosis not present

## 2019-04-15 DIAGNOSIS — I959 Hypotension, unspecified: Secondary | ICD-10-CM | POA: Diagnosis not present

## 2019-04-15 DIAGNOSIS — R188 Other ascites: Secondary | ICD-10-CM | POA: Diagnosis not present

## 2019-04-15 DIAGNOSIS — R069 Unspecified abnormalities of breathing: Secondary | ICD-10-CM | POA: Diagnosis not present

## 2019-04-15 IMAGING — US US PARACENTESIS
1 series · 5 of 5 positions shown · non-contrast
Comparison: none

INDICATION: Patient with history of alcoholic cirrhosis, hepatitis B/C,
end-stage renal disease, recurrent ascites. Request made for
diagnostic and therapeutic paracentesis.

[Series 1: us paracentesis · 0.26mm/px · 5 of 5 slices shown]
[im 1/5]
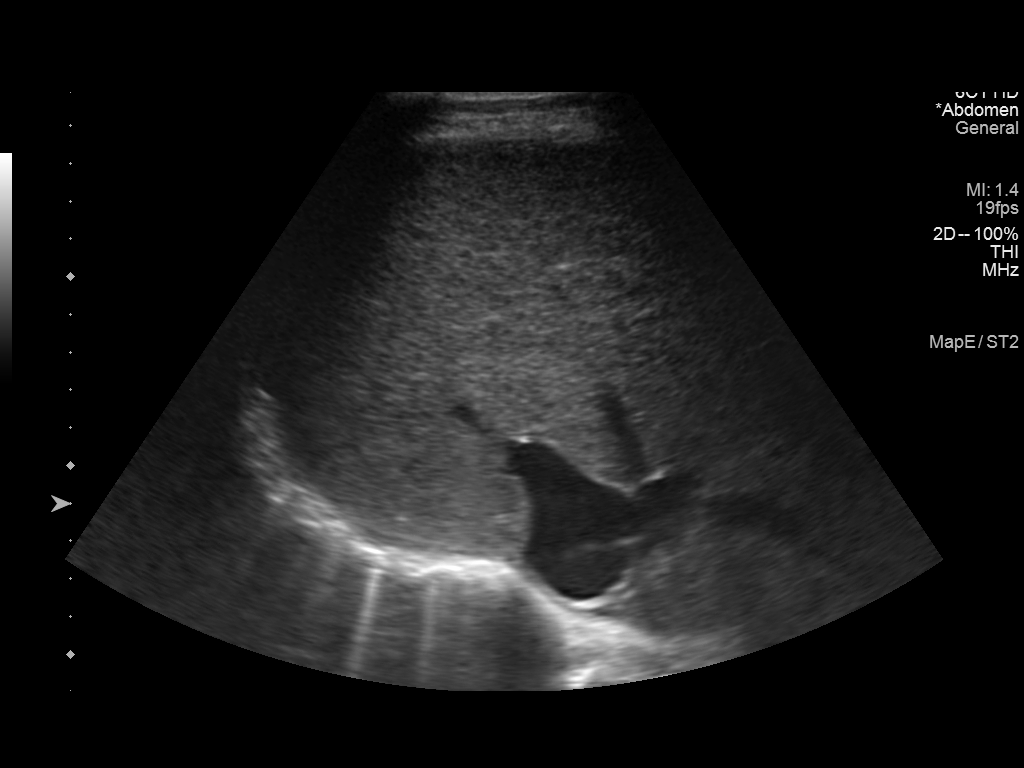
[im 2/5]
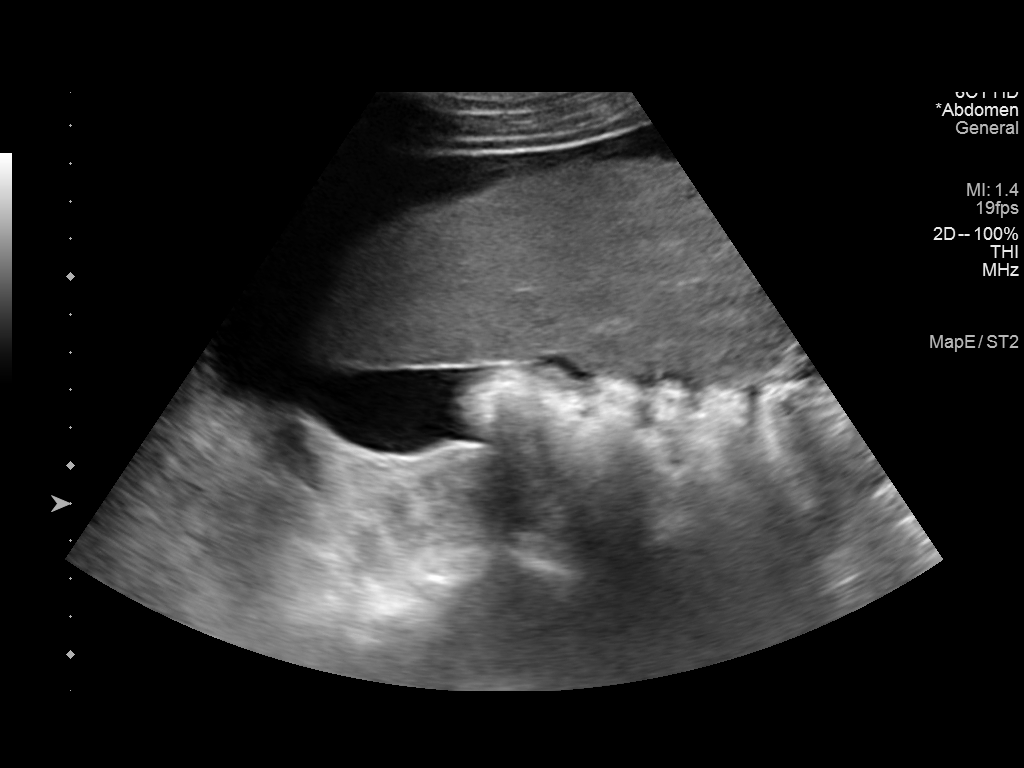
[im 3/5]
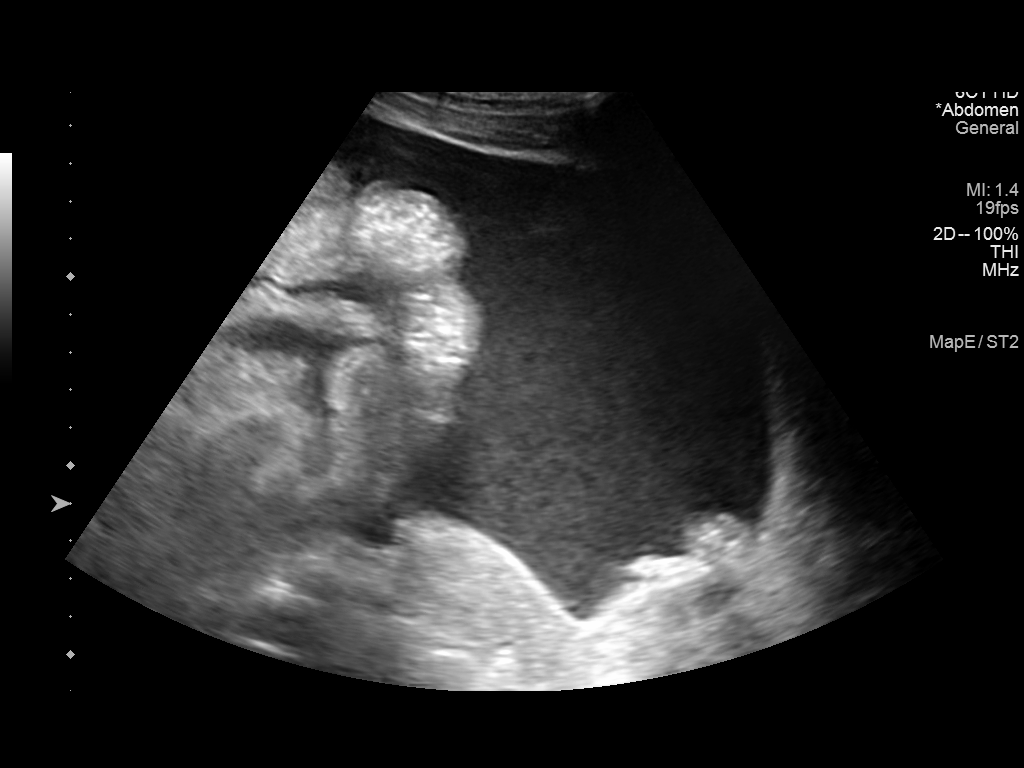
[im 4/5]
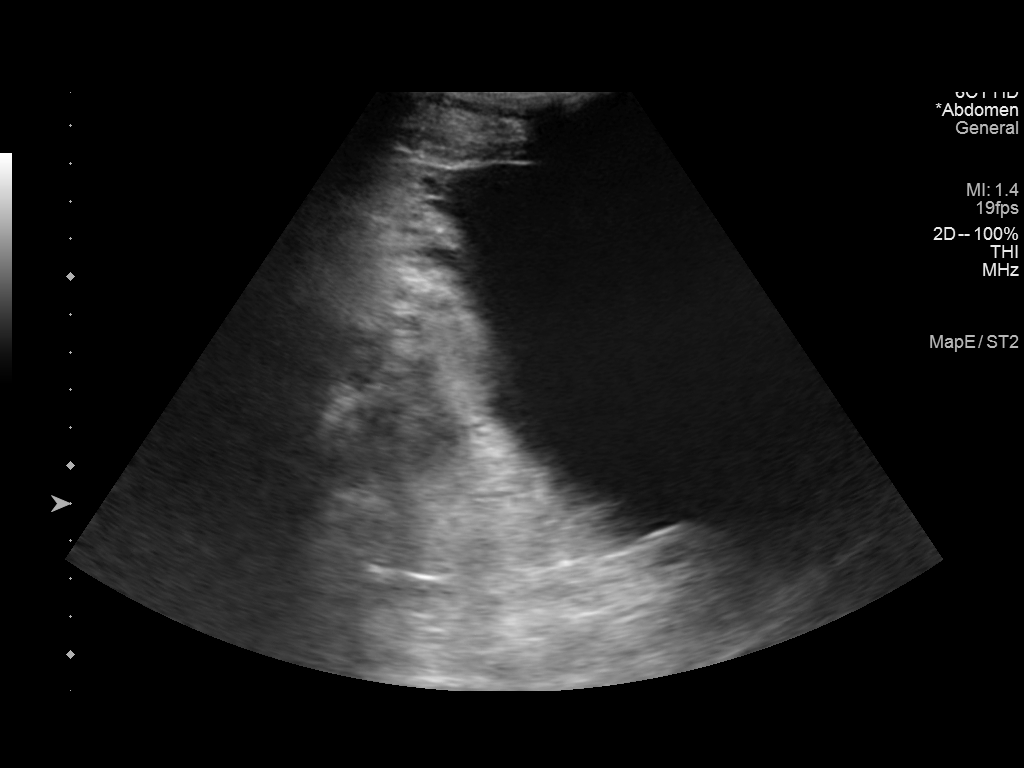
[im 5/5]
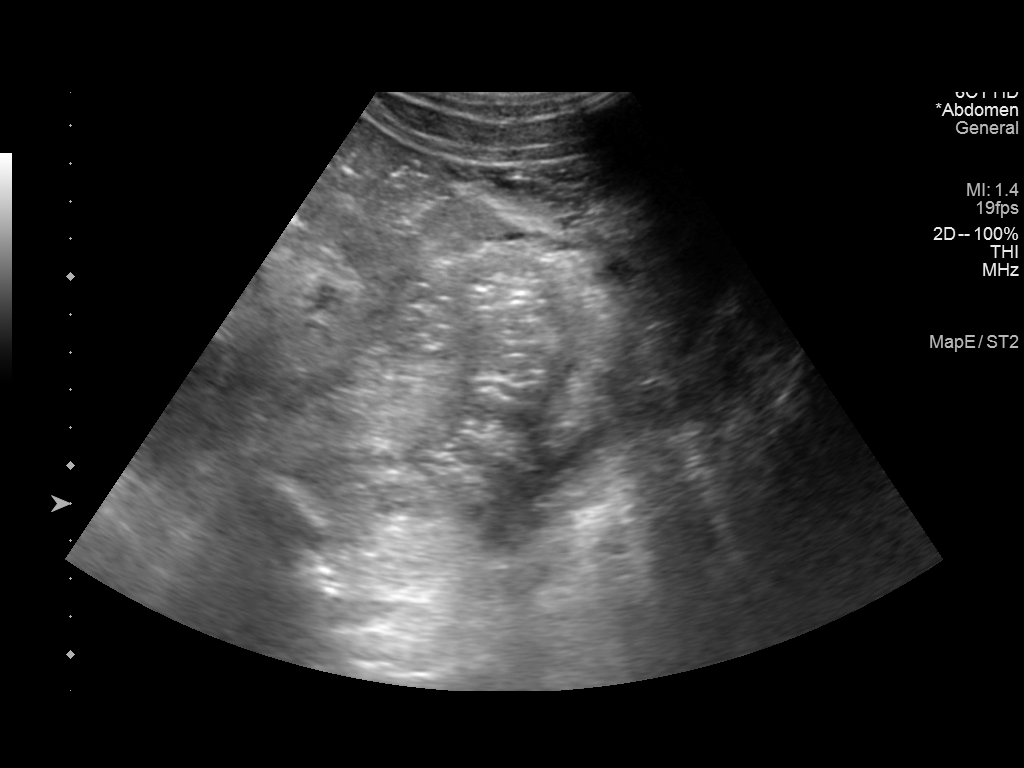

[5 of 5 positions shown; findings below may reference images not displayed]

EXAM:
ULTRASOUND GUIDED DIAGNOSTIC AND THERAPEUTIC PARACENTESIS

MEDICATIONS:
None

COMPLICATIONS:
None immediate.

PROCEDURE:
Informed written consent was obtained from the patient after a
discussion of the risks, benefits and alternatives to treatment. A
timeout was performed prior to the initiation of the procedure.

Initial ultrasound scanning demonstrates a moderate-to-large amount
of ascites within the left mid to lower abdominal quadrant. The left
mid to lower abdomen was prepped and draped in the usual sterile
fashion. 1% lidocaine was used for local anesthesia.

Following this, a 6 Fr Safe-T-Centesis catheter was introduced. An
ultrasound image was saved for documentation purposes. The
paracentesis was performed. The catheter was removed and a dressing
was applied. The patient tolerated the procedure well without
immediate post procedural complication.
FINDINGS: A total of approximately 3.7 liters of bloody fluid was removed.
Samples were sent to the laboratory as requested by the clinical
team.
IMPRESSION: Successful ultrasound-guided diagnostic and therapeutic paracentesis
yielding 3.7 liters of bloody peritoneal fluid. Dr. Algerina was
notified of the above findings.

## 2019-04-15 NOTE — ED Provider Notes (Signed)
[facility] EMERGENCY DEPARTMENT Provider Note   CSN: [id number] Arrival date & time: 04/15/19  2344     History   Chief Complaint Chief Complaint  Patient presents with  . Shortness of Breath    HPI [name] is a 58 y.o. male.     58 y/o decompensated cirrhosis secondary to hepatitis B and C with recurrent ascites and history of SBP, ESRD on MWF dialysis, Afib, COPD, GERD, prior substance abuse presents to the ED with c/o abdominal ascites.  States that fluid continues to accumulate in his abdomen.  When this occurs, it makes it hard for his diaphragm to expand causing him to feel short of breath.  He is not on any supplemental oxygen at home.  Reports some mild nausea without vomiting.  Had a normal bowel movement today.  Denies fevers, melena, hematochezia, chest pain.  Last underwent IR paracentesis on Thursday (3 days ago) where 3.75L peritoneal fluid was drained.  Due for dialysis tomorrow with complete compliance with sessions last week.   Shortness of Breath   Past Medical History:  Diagnosis Date  . Anemia   . Anxiety   . Arthritis    left shoulder  . Atherosclerosis of aorta ([place])   . Cardiomegaly   . Chest pain    DATE UNKNOWN, C/O PERIODICALLY  . Cocaine abuse ([place])   . COPD exacerbation ([place]) 08/17/2016  . Coronary artery disease    stent 02/22/17  . ESRD (end stage renal disease) on dialysis ([place])    "[name]; MWF" (07/04/2017)  . GERD (gastroesophageal reflux disease)    DATE UNKNOWN  . Hemorrhoids   . Hepatitis B, chronic ([place])   . Hepatitis C   . History of kidney stones   . Hyperkalemia   . Hypertension   . Kidney failure   . Metabolic bone disease    Patient denies  . Mitral stenosis   . Myocardial infarction ([place])   . Pneumonia   . Pulmonary edema   . Solitary rectal ulcer syndrome 07/2017   at flex sig for rectal bleeding  . Tubular adenoma of colon     Patient Active Problem List   Diagnosis Date  Noted  . Melena   . Pressure injury of skin 03/09/2019  . Abdominal distention   . Volume overload 12/28/2018  . Sepsis ([place]) 09/12/2018  . Atherosclerosis of native coronary artery of native heart without angina pectoris 03/11/2018  . Benign neoplasm of cecum   . Benign neoplasm of ascending colon   . Benign neoplasm of descending colon   . Benign neoplasm of rectum   . Paroxysmal atrial fibrillation ([place]) 01/23/2018  . Hx of colonic polyps 01/20/2018  . ESRD (end stage renal disease) ([place]) 11/21/2017  . GERD (gastroesophageal reflux disease) 11/16/2017  . Liver cirrhosis ([place]) 11/15/2017  . DNR (do not resuscitate)   . Palliative care by specialist   . Hyponatremia 11/04/2017  . SBP (spontaneous bacterial peritonitis) ([place]) 10/30/2017  . Liver disease, chronic 10/30/2017  . SOB (shortness of breath)   . Abdominal pain 10/28/2017  . Upper airway cough syndrome with flattening on f/v loop 10/13/17 c/w vcd 10/17/2017  . Elevated diaphragm 10/13/2017  . Ileus ([place]) 09/29/2017  . Malnutrition of moderate degree 09/29/2017  . Sinus congestion 09/03/2017  . Symptomatic anemia 09/02/2017  . Other cirrhosis of liver ([place]) 09/02/2017  . Left bundle branch block 09/02/2017  . Mitral stenosis 09/02/2017  . Hematochezia 07/15/2017  . Wide-complex  tachycardia ([place])   . Endotracheally intubated   . ESRD on dialysis ([place]) 07/04/2017  . Acute respiratory failure with hypoxia ([place]) 06/18/2017  . CKD (chronic kidney disease) stage V requiring chronic dialysis ([place]) 06/18/2017  . History of Cocaine abuse ([place]) 06/18/2017  . Hypertension 06/18/2017  . Infection of AV graft for dialysis ([place]) 06/18/2017  . Anxiety 06/18/2017  . Anemia due to chronic kidney disease 06/18/2017  . Atrial flutter with rapid ventricular response ([place]) 06/18/2017  . Personality disorder ([place]) 06/13/2017  . Cellulitis 06/12/2017  . Adjustment disorder with mixed anxiety and depressed mood 06/10/2017  . Suicidal  ideation 06/10/2017  . Arm wound, left, sequela 06/10/2017  . Dyspnea on exertion 05/29/2017  . Tachycardia 05/29/2017  . Hyperkalemia 05/22/2017  . Acute metabolic encephalopathy   . Anemia 04/23/2017  . Ascites 04/23/2017  . COPD (chronic obstructive pulmonary disease) ([place]) 04/23/2017  . Acute on chronic respiratory failure with hypoxia ([place]) 03/25/2017  . Arrhythmia 03/25/2017  . COPD GOLD 0 with flattening on inps f/v  09/27/2016  . Essential hypertension 09/27/2016  . Fluid overload 08/30/2016  . COPD exacerbation ([place]) 08/17/2016  . Hypertensive urgency 08/17/2016  . Respiratory failure ([place]) 08/17/2016  . Problem with dialysis access ([place]) 07/23/2016  . Chronic hepatitis B ([place]) 03/05/2014  . Chronic hepatitis C without hepatic coma ([place]) 03/05/2014  . Internal hemorrhoids with bleeding, swelling and itching 03/05/2014  . Thrombocytopenia ([place]) 03/05/2014  . Chest pain 02/27/2014  . Alcohol abuse 04/14/2009  . Cigarette smoker 04/14/2009  . GANGLION CYST 04/14/2009    Past Surgical History:  Procedure Laterality Date  . A/V FISTULAGRAM Left 05/26/2017   Procedure: A/V FISTULAGRAM;  Surgeon: Conrad Ellisville, MD;  Location: [facility];  Service: Cardiovascular;  Laterality: Left;  . A/V FISTULAGRAM Right 11/18/2017   Procedure: A/V FISTULAGRAM - Right Arm;  Surgeon: Elam Dutch, MD;  Location: [facility];  Service: Cardiovascular;  Laterality: Right;  . APPLICATION OF WOUND VAC Left 06/14/2017   Procedure: APPLICATION OF WOUND VAC;  Surgeon: Katha Cabal, MD;  Location: [facility];  Service: Vascular;  Laterality: Left;  . AV FISTULA PLACEMENT  2012   BELIEVED WAS PLACED IN JUNE  . AV FISTULA PLACEMENT Right 08/09/2017   Procedure: Creation Right arm ARTERIOVENOUS BRACHIOCEPOHALIC FISTULA;  Surgeon: Elam Dutch, MD;  Location: [facility] OR;  Service: Vascular;  Laterality: Right;  . AV FISTULA PLACEMENT Right 11/22/2017   Procedure: INSERTION OF  ARTERIOVENOUS (AV) GORE-TEX GRAFT RIGHT UPPER ARM;  Surgeon: Elam Dutch, MD;  Location: [facility];  Service: Vascular;  Laterality: Right;  . BIOPSY  01/25/2018   Procedure: BIOPSY;  Surgeon: Jerene Bears, MD;  Location: [facility];  Service: Gastroenterology;;  . BIOPSY  04/10/2019   Procedure: BIOPSY;  Surgeon: Jerene Bears, MD;  Location: [facility];  Service: Gastroenterology;;  . COLONOSCOPY    . COLONOSCOPY WITH PROPOFOL N/A 01/25/2018   Procedure: COLONOSCOPY WITH PROPOFOL;  Surgeon: Jerene Bears, MD;  Location: [facility];  Service: Gastroenterology;  Laterality: N/A;  . CORONARY STENT INTERVENTION N/A 02/22/2017   Procedure: CORONARY STENT INTERVENTION;  Surgeon: Nigel Mormon, MD;  Location: [facility];  Service: Cardiovascular;  Laterality: N/A;  . ESOPHAGOGASTRODUODENOSCOPY (EGD) WITH PROPOFOL N/A 01/25/2018   Procedure: ESOPHAGOGASTRODUODENOSCOPY (EGD) WITH PROPOFOL;  Surgeon: Jerene Bears, MD;  Location: [facility];  Service: Gastroenterology;  Laterality: N/A;  . ESOPHAGOGASTRODUODENOSCOPY (EGD) WITH PROPOFOL N/A 04/10/2019  Procedure: ESOPHAGOGASTRODUODENOSCOPY (EGD) WITH PROPOFOL;  Surgeon: Jerene Bears, MD;  Location: [facility];  Service: Gastroenterology;  Laterality: N/A;  . FLEXIBLE SIGMOIDOSCOPY N/A 07/15/2017   Procedure: FLEXIBLE SIGMOIDOSCOPY;  Surgeon: Carol Ada, MD;  Location: [facility];  Service: Endoscopy;  Laterality: N/A;  . HEMORRHOID BANDING    . I&D EXTREMITY Left 06/01/2017   Procedure: IRRIGATION AND DEBRIDEMENT LEFT ARM HEMATOMA WITH LIGATION OF LEFT ARM AV FISTULA;  Surgeon: Elam Dutch, MD;  Location: [facility];  Service: Vascular;  Laterality: Left;  . I&D EXTREMITY Left 06/14/2017   Procedure: IRRIGATION AND DEBRIDEMENT EXTREMITY;  Surgeon: Katha Cabal, MD;  Location: [facility];  Service: Vascular;  Laterality: Left;  . INSERTION OF DIALYSIS CATHETER  05/30/2017  . INSERTION OF DIALYSIS CATHETER N/A 05/30/2017    Procedure: INSERTION OF DIALYSIS CATHETER;  Surgeon: Elam Dutch, MD;  Location: [facility];  Service: Vascular;  Laterality: N/A;  . IR PARACENTESIS  08/30/2017  . IR PARACENTESIS  09/29/2017  . IR PARACENTESIS  10/28/2017  . IR PARACENTESIS  11/09/2017  . IR PARACENTESIS  11/16/2017  . IR PARACENTESIS  11/28/2017  . IR PARACENTESIS  12/01/2017  . IR PARACENTESIS  12/06/2017  . IR PARACENTESIS  01/03/2018  . IR PARACENTESIS  01/23/2018  . IR PARACENTESIS  02/07/2018  . IR PARACENTESIS  02/21/2018  . IR PARACENTESIS  03/06/2018  . IR PARACENTESIS  03/17/2018  . IR PARACENTESIS  04/04/2018  . IR PARACENTESIS  12/28/2018  . IR PARACENTESIS  01/08/2019  . IR PARACENTESIS  01/23/2019  . IR PARACENTESIS  02/01/2019  . IR PARACENTESIS  02/19/2019  . IR PARACENTESIS  03/01/2019  . IR PARACENTESIS  03/15/2019  . IR PARACENTESIS  04/03/2019  . IR PARACENTESIS  04/12/2019  . IR RADIOLOGIST EVAL & MGMT  02/14/2018  . IR RADIOLOGIST EVAL & MGMT  02/22/2019  . LEFT HEART CATH AND CORONARY ANGIOGRAPHY N/A 02/22/2017   Procedure: LEFT HEART CATH AND CORONARY ANGIOGRAPHY;  Surgeon: Nigel Mormon, MD;  Location: [facility];  Service: Cardiovascular;  Laterality: N/A;  . LIGATION OF ARTERIOVENOUS  FISTULA Left 07/17/1094   Procedure: Plication of Left Arm Arteriovenous Fistula;  Surgeon: Elam Dutch, MD;  Location: [facility];  Service: Vascular;  Laterality: Left;  . POLYPECTOMY    . POLYPECTOMY  01/25/2018   Procedure: POLYPECTOMY;  Surgeon: Jerene Bears, MD;  Location: [facility];  Service: Gastroenterology;;  . REVISON OF ARTERIOVENOUS FISTULA Left 0/45/4098   Procedure: PLICATION OF DISTAL ANEURYSMAL SEGEMENT OF LEFT UPPER ARM ARTERIOVENOUS FISTULA;  Surgeon: Elam Dutch, MD;  Location: [facility];  Service: Vascular;  Laterality: Left;  . REVISON OF ARTERIOVENOUS FISTULA Left 07/30/1476   Procedure: Plication of Left Upper Arm Fistula ;  Surgeon: Waynetta Sandy, MD;  Location: [facility];  Service:  Vascular;  Laterality: Left;  . SKIN GRAFT SPLIT THICKNESS LEG / FOOT Left    SKIN GRAFT SPLIT THICKNESS LEFT ARM DONOR SITE: LEFT ANTERIOR THIGH  . SKIN SPLIT GRAFT Left 07/04/2017   Procedure: SKIN GRAFT SPLIT THICKNESS LEFT ARM DONOR SITE: LEFT ANTERIOR THIGH;  Surgeon: Elam Dutch, MD;  Location: [facility];  Service: Vascular;  Laterality: Left;  . THROMBECTOMY W/ EMBOLECTOMY Left 06/05/2017   Procedure: EXPLORATION OF LEFT ARM FOR BLEEDING; OVERSEWED PROXIMAL FISTULA;  Surgeon: Angelia Mould, MD;  Location: [facility];  Service: Vascular;  Laterality: Left;  . WOUND EXPLORATION Left 06/03/2017   Procedure: WOUND EXPLORATION  WITH WOUND VAC APPLICATION TO LEFT ARM;  Surgeon: Angelia Mould, MD;  Location: [facility];  Service: Vascular;  Laterality: Left;        Home Medications    Prior to Admission medications   Medication Sig Start Date End Date Taking? Authorizing Provider  albuterol (VENTOLIN HFA) 108 (90 Base) MCG/ACT inhaler Inhale 2 puffs into the lungs every 6 (six) hours as needed for wheezing or shortness of breath.    [provider]  hydrALAZINE (APRESOLINE) 100 MG tablet Take 100 mg by mouth 3 (three) times daily.  02/13/19   [provider]  hydrOXYzine (ATARAX/VISTARIL) 25 MG tablet Take 25 mg by mouth 3 (three) times daily as needed for itching.  02/14/19   [provider]  lactulose (CHRONULAC) 10 GM/15ML solution TAKE 45 MLS BY MOUTH 2 TIMES DAILY. Patient taking differently: Take 30 g by mouth daily.  01/30/19   Pyrtle, Lajuan Lines, MD  metoprolol tartrate (LOPRESSOR) 25 MG tablet Take 1 tablet (25 mg total) by mouth 2 (two) times daily. 03/11/18   Geradine Girt, DO  montelukast (SINGULAIR) 10 MG tablet Take 10 mg by mouth every evening.     [provider]  ondansetron (ZOFRAN) 8 MG tablet Take 8 mg by mouth every 8 (eight) hours as needed for nausea or vomiting.     [provider]  pantoprazole (PROTONIX) 40 MG tablet  Take 1 tablet (40 mg total) by mouth daily. Patient taking differently: Take 40 mg by mouth every evening.  03/22/19   Zehr, Laban Emperor, PA-C  sevelamer carbonate (RENVELA) 800 MG tablet Take 4 tablets with meals  And 2 tablets twice daily with snacks Patient taking differently: Take 1,600 mg by mouth See admin instructions. Take 2 tablets (1600mg ) with meals & with snacks 01/22/18   Patrecia Pour, MD  sulfamethoxazole-trimethoprim (BACTRIM DS) 800-160 MG tablet Take 1 tablet by mouth daily. Patient taking differently: Take 1 tablet by mouth 2 (two) times daily.  12/30/18   Geradine Girt, DO  SYMBICORT 160-4.5 MCG/ACT inhaler Inhale 2 puffs into the lungs 2 (two) times daily.  11/30/18   [provider]  zolpidem (AMBIEN) 10 MG tablet Take 10 mg by mouth at bedtime as needed for sleep.    [provider]    Family History Family History  Problem Relation Age of Onset  . Heart disease Mother   . Lung cancer Mother   . Heart disease Father   . Malignant hyperthermia Father   . COPD Father   . Throat cancer Sister   . Esophageal cancer Sister   . Hypertension Other   . COPD Other   . Colon cancer Neg Hx   . Colon polyps Neg Hx   . Rectal cancer Neg Hx   . Stomach cancer Neg Hx     Social History Social History   Tobacco Use  . Smoking status: Current Every Day Smoker    Packs/day: 0.50    Years: 43.00    Pack years: 21.50    Types: Cigarettes    Start date: 08/13/1973  . Smokeless tobacco: Never Used  . Tobacco comment: i dont know i just make   Substance Use Topics  . Alcohol use: Not Currently    Frequency: Never    Comment: quit drinking in 2017  . Drug use: Yes    Types: Marijuana, Cocaine    Comment: reports using once every 3 months, 04-06-2019 was this  Allergies   Morphine and related, Aspirin, Clonidine derivatives, Tramadol, and Tylenol [acetaminophen]   Review of Systems Review of Systems  Respiratory: Positive for shortness of breath.    Ten systems reviewed and are negative for acute change, except as noted in the HPI.     Physical Exam Updated Vital Signs BP 107/67   Pulse 94   Temp 98.3 F (36.8 C)   Resp (!) 22   Ht 5\' 10"  (1.778 m)   Wt 61 kg   SpO2 95%   BMI 19.30 kg/m   Physical Exam Vitals signs and nursing note reviewed.  Constitutional:      General: He is not in acute distress.    Appearance: He is well-developed. He is not diaphoretic.     Comments: Chronically ill appearing, nontoxic.  HENT:     Head: Normocephalic and atraumatic.  Eyes:     General: No scleral icterus.    Conjunctiva/sclera: Conjunctivae normal.  Neck:     Musculoskeletal: Normal range of motion.  Cardiovascular:     Rate and Rhythm: Normal rate. Rhythm irregularly irregular.  Pulmonary:     Effort: Pulmonary effort is normal. No respiratory distress.     Breath sounds: No stridor. No wheezing or rhonchi.     Comments: Lungs grossly CTAB. Respirations even and unlabored. Placed on supplemental O2 for comfort. Satting 98-100% on 2L via [place]. Abdominal:     General: There is distension.     Palpations: Abdomen is soft.     Comments: Abdomen soft, distended. Mild fluid wave. No focal TTP. No peritoneal signs.  Musculoskeletal: Normal range of motion.  Skin:    General: Skin is warm and dry.     Coloration: Skin is not pale.     Findings: No erythema or rash.  Neurological:     General: No focal deficit present.     Mental Status: He is alert and oriented to person, place, and time.     Coordination: Coordination normal.     Comments: GCS 15. Patient moving all extremities spontaneously.  Psychiatric:        Behavior: Behavior normal.      ED Treatments / Results  Labs (all labs ordered are listed, but only abnormal results are displayed) Labs Reviewed  CBC WITH DIFFERENTIAL/PLATELET - Abnormal; Notable for the following components:      Result Value   RBC 3.65 (*)    Hemoglobin 10.9 (*)    HCT 29.8 (*)     MCHC 36.6 (*)    All other components within normal limits  COMPREHENSIVE METABOLIC PANEL - Abnormal; Notable for the following components:   Sodium 132 (*)    Chloride 94 (*)    BUN 46 (*)    Creatinine, Ser 7.91 (*)    Calcium 8.8 (*)    Total Protein 6.2 (*)    Albumin 3.2 (*)    GFR calc non Af Amer 7 (*)    GFR calc Af Amer 8 (*)    All other components within normal limits  PROTIME-INR    EKG EKG Interpretation  Date/Time:  Sunday April 15 2019 23:51:16 EDT Ventricular Rate:  109 PR Interval:    QRS Duration: 166 QT Interval:  428 QTC Calculation: 577 R Axis:   -64 Text Interpretation:  Atrial Fibrillation Left bundle branch block No significant change since last tracing Confirmed by Orpah Greek ([id number]) on 04/15/2019 11:59:26 PM   Radiology Dg Chest Port 1 View  Result Date:  04/16/2019 CLINICAL DATA:  Shortness of breath EXAM: PORTABLE CHEST 1 VIEW COMPARISON:  02/09/2019 FINDINGS: Carotid calcifications. Stable moderate cardiomegaly. No consolidation, pleural effusion, or overt pulmonary edema. IMPRESSION: No active disease.  Stable cardiomegaly. Electronically Signed   By: Donavan Foil M.D.   On: 04/16/2019 00:35    Procedures Procedures (including critical care time)  Medications Ordered in ED Medications  fentaNYL (SUBLIMAZE) injection 50 mcg (50 mcg Intravenous Given 04/16/19 0042)  HYDROmorphone (DILAUDID) injection 0.5 mg (0.5 mg Intravenous Given 04/16/19 0135)     Initial Impression / Assessment and Plan / ED Course  I have reviewed the triage vital signs and the nursing notes.  Pertinent labs & imaging results that were available during my care of the patient were reviewed by me and considered in my medical decision making (see chart for details).        58 y/o presenting with c/o ascites. States his ascites is accumulating causing pressure in his abdomen and on his diaphragm which makes it hard for him to breathe.   His abdominal  exam is fairly benign. Slight distension noted, but abdomen is soft without TTP. No fever or leukocytosis. Doubt SBP. On chart review, the patient just had 3.75L of fluid drained off by IR 3 days ago. Peritoneal fluid at this time was without concern for infection. His lungs are grossly CTAB. Placed on supplemental O2 by EMS for comfort; however is without chronic O2 requirement. sats here are 95-97% on room air. No signs of respiratory distress.  Labs reviewed and at baseline, c/w need for dialysis which patient is scheduled for tomorrow. CXR today is also stable. EKG with Afib which patient has a history of. He is not on chronic anticoagulation.  Presently, I do not feel there is an emergent condition that requires further evaluation in the ED or inpatient. The patient has been instructed to follow up with his GI physician regarding his ongoing concern of ascites management. Return precautions discussed and provided. Patient discharged in stable condition with no unaddressed concerns.   Final Clinical Impressions(s) / ED Diagnoses   Final diagnoses:  Encounter for assessment of ascites    ED Discharge Orders    None       Antonietta Breach, PA-C 04/16/19 0235    Orpah Greek, MD 04/16/19 [phone]

## 2019-04-15 NOTE — ED Notes (Signed)
Moved patient to room 24; this patient is requesting to be put on "mask" for oxygen. Noted patient is on 6 L via [place]; Sp02: 100 %. Informed patient of current Sp02. Patient requested to be lay flat as well and c/o difficulty breathing. Patient continued to ask for a mask. No respiratory distress noted. Informed patient of current Sp02 w/c remained 97-100% on current oxygen delivery.

## 2019-04-15 NOTE — ED Triage Notes (Signed)
Pt bib [facility] d/t shob/abdominal pain. Pt is a MWF dialysis.  Pt reported needing paracentesis Q week otherwise he becomes shob. Per EMS pt's abdomen is distended.  Pt was placed on 2L O2 via [place] by EMS for comfort. Pt denies CP, weakness, N/V or other sx. Pt was found to be orthostatic. 108/60 lying and 82/40 sitting.

## 2019-04-16 ENCOUNTER — Encounter (HOSPITAL_COMMUNITY): Payer: Self-pay | Admitting: Oncology

## 2019-04-16 ENCOUNTER — Other Ambulatory Visit: Payer: Self-pay

## 2019-04-16 ENCOUNTER — Emergency Department (HOSPITAL_COMMUNITY): Payer: Medicare Other

## 2019-04-16 DIAGNOSIS — N186 End stage renal disease: Secondary | ICD-10-CM | POA: Diagnosis not present

## 2019-04-16 DIAGNOSIS — R0602 Shortness of breath: Secondary | ICD-10-CM | POA: Diagnosis not present

## 2019-04-16 DIAGNOSIS — D509 Iron deficiency anemia, unspecified: Secondary | ICD-10-CM | POA: Diagnosis not present

## 2019-04-16 DIAGNOSIS — R188 Other ascites: Secondary | ICD-10-CM | POA: Diagnosis not present

## 2019-04-16 DIAGNOSIS — N2581 Secondary hyperparathyroidism of renal origin: Secondary | ICD-10-CM | POA: Diagnosis not present

## 2019-04-16 DIAGNOSIS — Z992 Dependence on renal dialysis: Secondary | ICD-10-CM | POA: Diagnosis not present

## 2019-04-16 DIAGNOSIS — D631 Anemia in chronic kidney disease: Secondary | ICD-10-CM | POA: Diagnosis not present

## 2019-04-16 LAB — COMPREHENSIVE METABOLIC PANEL
ALT: 12 U/L (ref 0–44)
AST: 19 U/L (ref 15–41)
Albumin: 3.2 g/dL — ABNORMAL LOW (ref 3.5–5.0)
Alkaline Phosphatase: 90 U/L (ref 38–126)
Anion gap: 14 (ref 5–15)
BUN: 46 mg/dL — ABNORMAL HIGH (ref 6–20)
CO2: 24 mmol/L (ref 22–32)
Calcium: 8.8 mg/dL — ABNORMAL LOW (ref 8.9–10.3)
Chloride: 94 mmol/L — ABNORMAL LOW (ref 98–111)
Creatinine, Ser: 7.91 mg/dL — ABNORMAL HIGH (ref 0.61–1.24)
GFR calc Af Amer: 8 mL/min — ABNORMAL LOW (ref >60)
GFR calc non Af Amer: 7 mL/min — ABNORMAL LOW (ref >60)
Glucose, Bld: 99 mg/dL (ref 70–99)
Potassium: 4.9 mmol/L (ref 3.5–5.1)
Sodium: 132 mmol/L — ABNORMAL LOW (ref 135–145)
Total Bilirubin: 1 mg/dL (ref 0.3–1.2)
Total Protein: 6.2 g/dL — ABNORMAL LOW (ref 6.5–8.1)

## 2019-04-16 LAB — CBC WITH DIFFERENTIAL/PLATELET
Abs Immature Granulocytes: 0.04 10*3/uL (ref 0.00–0.07)
Basophils Absolute: 0.1 10*3/uL (ref 0.0–0.1)
Basophils Relative: 1 %
Eosinophils Absolute: 0.2 10*3/uL (ref 0.0–0.5)
Eosinophils Relative: 2 %
HCT: 29.8 % — ABNORMAL LOW (ref 39.0–52.0)
Hemoglobin: 10.9 g/dL — ABNORMAL LOW (ref 13.0–17.0)
Immature Granulocytes: 0 %
Lymphocytes Relative: 17 %
Lymphs Abs: 1.7 10*3/uL (ref 0.7–4.0)
MCH: 29.9 pg (ref 26.0–34.0)
MCHC: 36.6 g/dL — ABNORMAL HIGH (ref 30.0–36.0)
MCV: 81.6 fL (ref 80.0–100.0)
Monocytes Absolute: 0.9 10*3/uL (ref 0.1–1.0)
Monocytes Relative: 9 %
Neutro Abs: 7 10*3/uL (ref 1.7–7.7)
Neutrophils Relative %: 71 %
Platelets: 198 10*3/uL (ref 150–400)
RBC: 3.65 MIL/uL — ABNORMAL LOW (ref 4.22–5.81)
RDW: 13.2 % (ref 11.5–15.5)
WBC: 9.9 10*3/uL (ref 4.0–10.5)
nRBC: 0 % (ref 0.0–0.2)

## 2019-04-16 LAB — PROTIME-INR
INR: 1.1 (ref 0.8–1.2)
Prothrombin Time: 13.9 seconds (ref 11.4–15.2)

## 2019-04-16 MED ORDER — HYDROMORPHONE HCL 1 MG/ML IJ SOLN
0.5000 mg | Freq: Once | INTRAMUSCULAR | Status: AC
  Administered 2019-04-16: 0.5 mg via INTRAVENOUS
  Filled 2019-04-16: qty 1

## 2019-04-16 MED ORDER — FENTANYL CITRATE (PF) 100 MCG/2ML IJ SOLN
50.0000 ug | Freq: Once | INTRAMUSCULAR | Status: AC
  Administered 2019-04-16: 50 ug via INTRAVENOUS
  Filled 2019-04-16: qty 2

## 2019-04-16 NOTE — ED Notes (Signed)
Removed supplemental O2 and noticed patient's Sp02 remained in 95-97% on room air. Breathes easy and unlabored.

## 2019-04-16 NOTE — Discharge Instructions (Addendum)
Continue your daily prescribed medications.  Go to dialysis as scheduled.  Call your GI doctor to discuss the potential need for more frequent paracentesis to drain the accumulating fluid from your abdomen.  Your work-up in the ED today is been reassuring.  You may return for new or concerning symptoms.

## 2019-04-17 ENCOUNTER — Other Ambulatory Visit: Payer: Medicare Other | Admitting: Hospice

## 2019-04-17 DIAGNOSIS — Z515 Encounter for palliative care: Secondary | ICD-10-CM

## 2019-04-17 NOTE — Progress Notes (Signed)
[facility] Consult Note Telephone: [phone]  Fax: [phone]  PATIENT NAME: [name] DOB: 1960-09-12 MRN: [id number]  PRIMARY CARE PROVIDER:   [name], FNP  REFERRING PROVIDER:  [name], [place],  [place] [address]  RESPONSIBLE PARTY:   Self [phone]  Due to the COVID-19 crisis, this visit was done via telemedicine and it was initiated and consent by this patient and or family. Video-audio (telehealth) contact was unable to be done due to technical barriers from the patient's side.     RECOMMENDATIONS and PLAN:  1.  Advance care planning.  Patient listed as DNR in Epic note on 8/29//2020. He said he has different forms of care with limited options and sometimes confusing and frustrating to him. Therapeutic listening provided.  He affirmed his goal of care is to maximize quality of life and symptom management. He requested further discusion during next visit.   2.  ESRD on HD.  He said he had dialysis yesterday and it went well. He is able to go shopping at the grocery today. He denied pain/discomfort, SOB. Documentation shows noncompliance with dialysis. Encouraged compliance since it is a symptom management tool to can help maximize quality of life.  With multiple health issues including cirrhosis of the liver with ascites he verbalized understanding on the need to stay for his full sessions of dialysis. He said he has appointment Thursday next week for paracentesis for ascites.  I spent 30  minutes providing this consultation,  from 3.30pm  to 4.00pm. More than 50% of the time in this consultation was spent coordinating communication.   HISTORY OF PRESENT ILLNESS:  [name] is a 58 y.o. year old male with multiple medical problems including ESRD on HD, cirrhosis of the liver, Hep C, CAD, pulmonary HTN, CHF, COPD. Palliative Care was asked to help address goals of care.    CODE STATUS: DNR  PPS: 60% HOSPICE ELIGIBILITY/DIAGNOSIS: TBD  PAST MEDICAL HISTORY:  Past Medical History:  Diagnosis Date  . Anemia   . Anxiety   . Arthritis    left shoulder  . Atherosclerosis of aorta ([place])   . Cardiomegaly   . Chest pain    DATE UNKNOWN, C/O PERIODICALLY  . Cocaine abuse ([place])   . COPD exacerbation ([place]) 08/17/2016  . Coronary artery disease    stent 02/22/17  . ESRD (end stage renal disease) on dialysis ([place])    "[name]; MWF" (07/04/2017)  . GERD (gastroesophageal reflux disease)    DATE UNKNOWN  . Hemorrhoids   . Hepatitis B, chronic ([place])   . Hepatitis C   . History of kidney stones   . Hyperkalemia   . Hypertension   . Kidney failure   . Metabolic bone disease    Patient denies  . Mitral stenosis   . Myocardial infarction ([place])   . Pneumonia   . Pulmonary edema   . Solitary rectal ulcer syndrome 07/2017   at [facility] for rectal bleeding  . Tubular adenoma of colon     SOCIAL HX:  Social History   Tobacco Use  . Smoking status: Current Every Day Smoker    Packs/day: 0.50    Years: 43.00    Pack years: 21.50    Types: Cigarettes    Start date: 08/13/1973  . Smokeless tobacco: Never Used  . Tobacco comment: i dont know i just make   Substance Use Topics  .  Alcohol use: Not Currently    Frequency: Never    Comment: quit drinking in 2017    ALLERGIES:  Allergies  Allergen Reactions  . Morphine And Related Other (See Comments)    Stomach pain  . Aspirin Other (See Comments)    STOMACH PAIN  . Clonidine Derivatives Itching  . Tramadol Itching  . Tylenol [Acetaminophen] Nausea Only    Stomach ache     PERTINENT MEDICATIONS:  Outpatient Encounter Medications as of 04/17/2019  Medication Sig  . albuterol (VENTOLIN HFA) 108 (90 Base) MCG/ACT inhaler Inhale 2 puffs into the lungs every 6 (six) hours as needed for wheezing or shortness of breath.  . hydrALAZINE (APRESOLINE) 100 MG tablet Take 100 mg by mouth 3 (three) times  daily.   . hydrOXYzine (ATARAX/VISTARIL) 25 MG tablet Take 25 mg by mouth 3 (three) times daily as needed for itching.   . lactulose (CHRONULAC) 10 GM/15ML solution TAKE 45 MLS BY MOUTH 2 TIMES DAILY.  . metoprolol tartrate (LOPRESSOR) 25 MG tablet Take 1 tablet (25 mg total) by mouth 2 (two) times daily.  . montelukast (SINGULAIR) 10 MG tablet Take 10 mg by mouth every evening.   . ondansetron (ZOFRAN) 8 MG tablet Take 8 mg by mouth every 8 (eight) hours as needed for nausea or vomiting.   . pantoprazole (PROTONIX) 40 MG tablet Take 1 tablet (40 mg total) by mouth daily. (Patient taking differently: Take 40 mg by mouth every evening. )  . sevelamer carbonate (RENVELA) 800 MG tablet Take 4 tablets with meals  And 2 tablets twice daily with snacks (Patient taking differently: Take 1,600 mg by mouth See admin instructions. Take 2 tablets (1600mg ) with meals & with snacks)  . sulfamethoxazole-trimethoprim (BACTRIM DS) 800-160 MG tablet Take 1 tablet by mouth daily. (Patient taking differently: Take 1 tablet by mouth 2 (two) times daily. )  . SYMBICORT 160-4.5 MCG/ACT inhaler Inhale 2 puffs into the lungs 2 (two) times daily.   Marland Kitchen zolpidem (AMBIEN) 10 MG tablet Take 10 mg by mouth at bedtime as needed for sleep.   Facility-Administered Encounter Medications as of 04/17/2019  Medication  . albumin human 25 % solution 50 g    Teodoro Spray, NP

## 2019-04-19 ENCOUNTER — Emergency Department (HOSPITAL_COMMUNITY)
Admission: EM | Admit: 2019-04-19 | Discharge: 2019-04-20 | Disposition: A | Discharge status: Home / Self Care | Payer: Medicare Other | Attending: Emergency Medicine | Admitting: Emergency Medicine

## 2019-04-19 ENCOUNTER — Other Ambulatory Visit: Payer: Self-pay

## 2019-04-19 ENCOUNTER — Encounter (HOSPITAL_COMMUNITY): Payer: Self-pay | Admitting: Emergency Medicine

## 2019-04-19 DIAGNOSIS — R140 Abdominal distension (gaseous): Secondary | ICD-10-CM | POA: Diagnosis not present

## 2019-04-19 DIAGNOSIS — Z5321 Procedure and treatment not carried out due to patient leaving prior to being seen by health care provider: Secondary | ICD-10-CM | POA: Diagnosis not present

## 2019-04-19 DIAGNOSIS — R109 Unspecified abdominal pain: Secondary | ICD-10-CM | POA: Diagnosis not present

## 2019-04-19 DIAGNOSIS — R5381 Other malaise: Secondary | ICD-10-CM | POA: Diagnosis not present

## 2019-04-19 DIAGNOSIS — R531 Weakness: Secondary | ICD-10-CM | POA: Diagnosis not present

## 2019-04-19 LAB — COMPREHENSIVE METABOLIC PANEL
ALT: 12 U/L (ref 0–44)
AST: 26 U/L (ref 15–41)
Albumin: 3.3 g/dL — ABNORMAL LOW (ref 3.5–5.0)
Alkaline Phosphatase: 92 U/L (ref 38–126)
Anion gap: 16 — ABNORMAL HIGH (ref 5–15)
BUN: 51 mg/dL — ABNORMAL HIGH (ref 6–20)
CO2: 23 mmol/L (ref 22–32)
Calcium: 8.9 mg/dL (ref 8.9–10.3)
Chloride: 93 mmol/L — ABNORMAL LOW (ref 98–111)
Creatinine, Ser: 8.3 mg/dL — ABNORMAL HIGH (ref 0.61–1.24)
GFR calc Af Amer: 8 mL/min — ABNORMAL LOW (ref >60)
GFR calc non Af Amer: 6 mL/min — ABNORMAL LOW (ref >60)
Glucose, Bld: 100 mg/dL — ABNORMAL HIGH (ref 70–99)
Potassium: 5.1 mmol/L (ref 3.5–5.1)
Sodium: 132 mmol/L — ABNORMAL LOW (ref 135–145)
Total Bilirubin: 0.9 mg/dL (ref 0.3–1.2)
Total Protein: 6.4 g/dL — ABNORMAL LOW (ref 6.5–8.1)

## 2019-04-19 LAB — CBC
HCT: 29.6 % — ABNORMAL LOW (ref 39.0–52.0)
Hemoglobin: 10.3 g/dL — ABNORMAL LOW (ref 13.0–17.0)
MCH: 28.6 pg (ref 26.0–34.0)
MCHC: 34.8 g/dL (ref 30.0–36.0)
MCV: 82.2 fL (ref 80.0–100.0)
Platelets: 216 10*3/uL (ref 150–400)
RBC: 3.6 MIL/uL — ABNORMAL LOW (ref 4.22–5.81)
RDW: 13.3 % (ref 11.5–15.5)
WBC: 9 10*3/uL (ref 4.0–10.5)
nRBC: 0 % (ref 0.0–0.2)

## 2019-04-19 LAB — LIPASE, BLOOD: Lipase: 27 U/L (ref 11–51)

## 2019-04-19 MED ORDER — SODIUM CHLORIDE 0.9% FLUSH: 3.0000 mL | Freq: Once | INTRAVENOUS | Status: DC

## 2019-04-19 NOTE — ED Notes (Signed)
Pt called for vitals recheck but no answer in the lobby

## 2019-04-19 NOTE — ED Triage Notes (Signed)
Pt BIB [facility], c/o abdominal pain and distension. Dialysis MWF, pt states he did not go yesterday. EMS VSS.

## 2019-04-19 NOTE — ED Notes (Signed)
Pt called x2 for vitals recheck. No answer.

## 2019-04-20 DIAGNOSIS — D509 Iron deficiency anemia, unspecified: Secondary | ICD-10-CM | POA: Diagnosis not present

## 2019-04-20 DIAGNOSIS — N2581 Secondary hyperparathyroidism of renal origin: Secondary | ICD-10-CM | POA: Diagnosis not present

## 2019-04-20 DIAGNOSIS — N186 End stage renal disease: Secondary | ICD-10-CM | POA: Diagnosis not present

## 2019-04-20 DIAGNOSIS — D631 Anemia in chronic kidney disease: Secondary | ICD-10-CM | POA: Diagnosis not present

## 2019-04-20 DIAGNOSIS — Z992 Dependence on renal dialysis: Secondary | ICD-10-CM | POA: Diagnosis not present

## 2019-04-20 NOTE — ED Notes (Signed)
Pt called for vitals recheck with no answer in the lobby

## 2019-04-20 NOTE — ED Notes (Signed)
Pt taken OTF

## 2019-04-23 DIAGNOSIS — N186 End stage renal disease: Secondary | ICD-10-CM | POA: Diagnosis not present

## 2019-04-23 DIAGNOSIS — N2581 Secondary hyperparathyroidism of renal origin: Secondary | ICD-10-CM | POA: Diagnosis not present

## 2019-04-23 DIAGNOSIS — Z992 Dependence on renal dialysis: Secondary | ICD-10-CM | POA: Diagnosis not present

## 2019-04-23 DIAGNOSIS — D631 Anemia in chronic kidney disease: Secondary | ICD-10-CM | POA: Diagnosis not present

## 2019-04-23 DIAGNOSIS — D509 Iron deficiency anemia, unspecified: Secondary | ICD-10-CM | POA: Diagnosis not present

## 2019-04-25 DIAGNOSIS — N2581 Secondary hyperparathyroidism of renal origin: Secondary | ICD-10-CM | POA: Diagnosis not present

## 2019-04-25 DIAGNOSIS — Z992 Dependence on renal dialysis: Secondary | ICD-10-CM | POA: Diagnosis not present

## 2019-04-25 DIAGNOSIS — D631 Anemia in chronic kidney disease: Secondary | ICD-10-CM | POA: Diagnosis not present

## 2019-04-25 DIAGNOSIS — D509 Iron deficiency anemia, unspecified: Secondary | ICD-10-CM | POA: Diagnosis not present

## 2019-04-25 DIAGNOSIS — N186 End stage renal disease: Secondary | ICD-10-CM | POA: Diagnosis not present

## 2019-04-26 IMAGING — CT CT ABD-PELV W/ CM
2 of 5 series · 16 of 46 positions shown, 18 images · IV contrast (ISOVUE)
Comparison: 09/28/2017

CLINICAL DATA: Acute abdominal pain, generalized.

EXAM:
CT ABDOMEN AND PELVIS WITH CONTRAST
TECHNIQUE: Multidetector CT imaging of the abdomen and pelvis was performed
using the standard protocol following bolus administration of
intravenous contrast.
CONTRAST:  80mL [id number] IOPAMIDOL (W3F13O-[id number]) INJECTION 61%

[Series 2: axial st · axial · 0.73mm/px · z∈[-450,-30]mm · 13 of 98 slices shown, 15 images]
[im 7/98  soft-tissue]
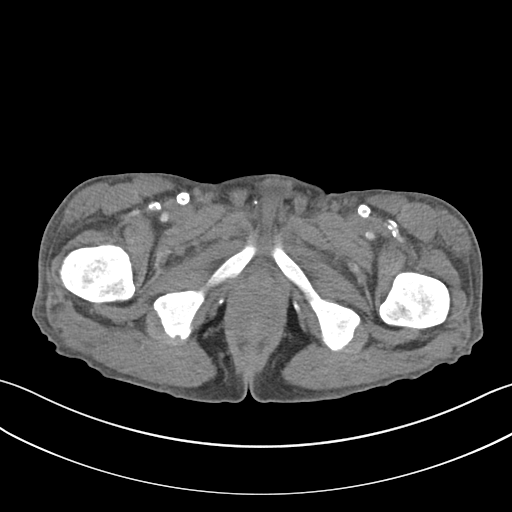
[im 7/98  bone]
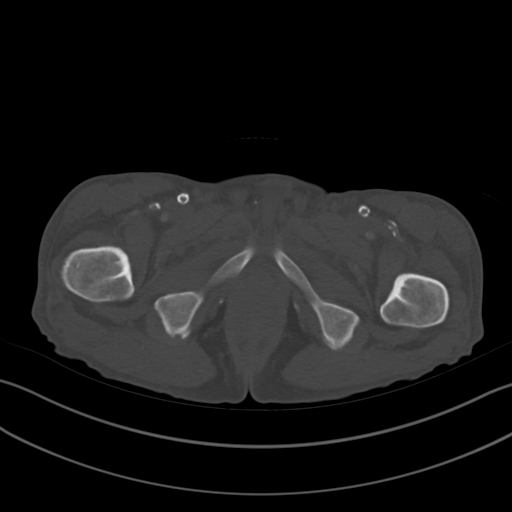
[im 13/98  soft-tissue]
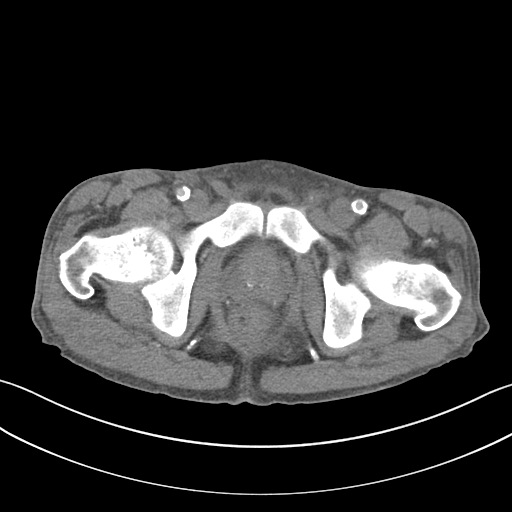
[im 20/98  soft-tissue]
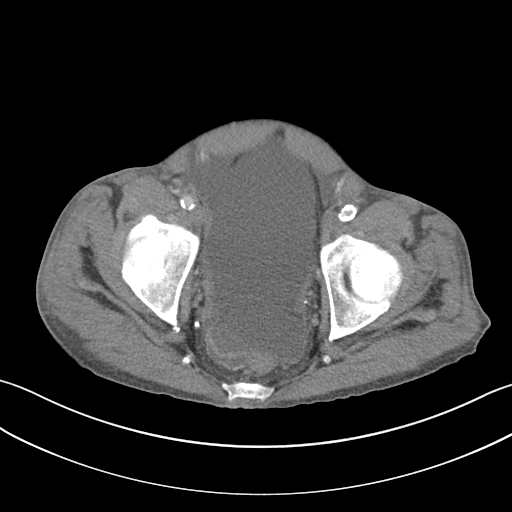
[im 26/98  soft-tissue]
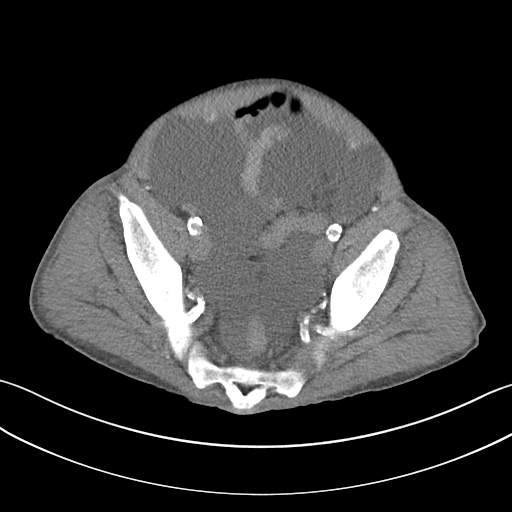
[im 33/98  soft-tissue]
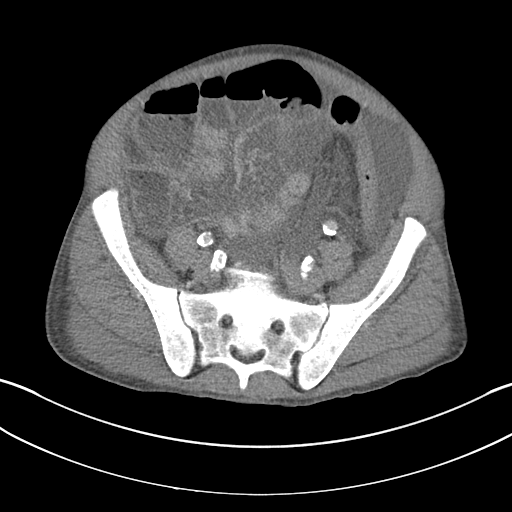
[im 39/98  soft-tissue]
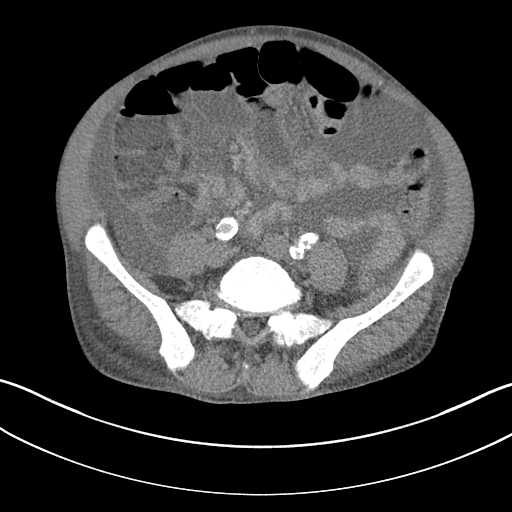
[im 52/98  soft-tissue]
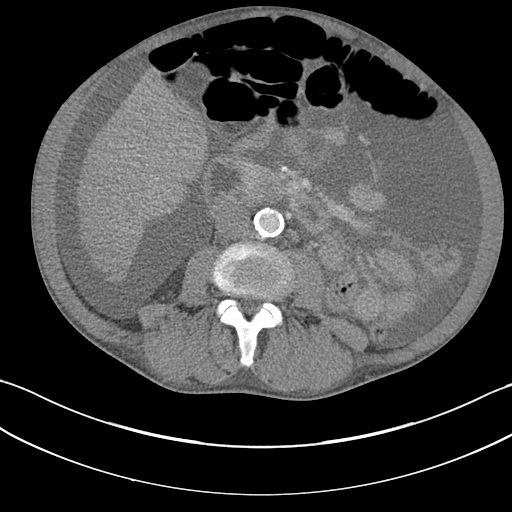
[im 59/98  soft-tissue]
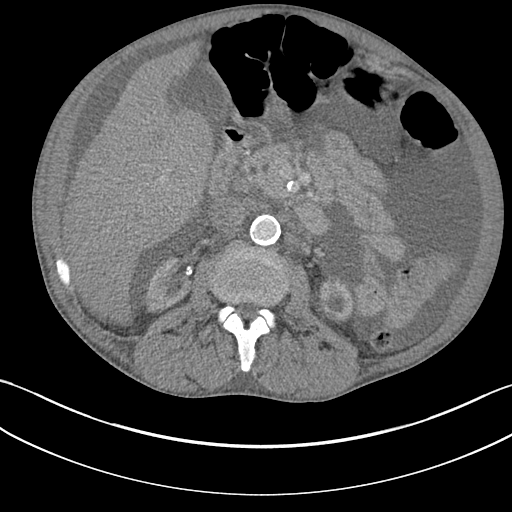
[im 65/98  soft-tissue]
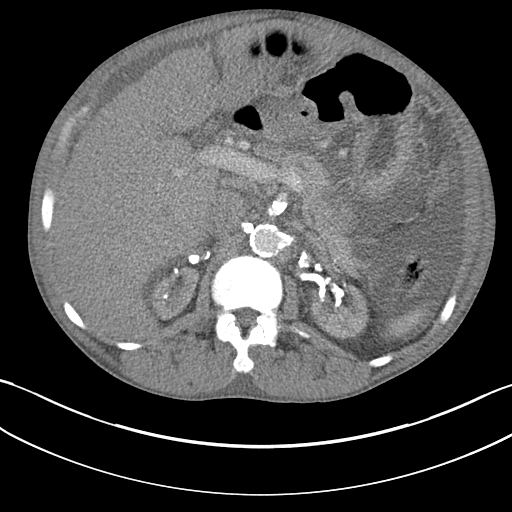
[im 65/98  bone]
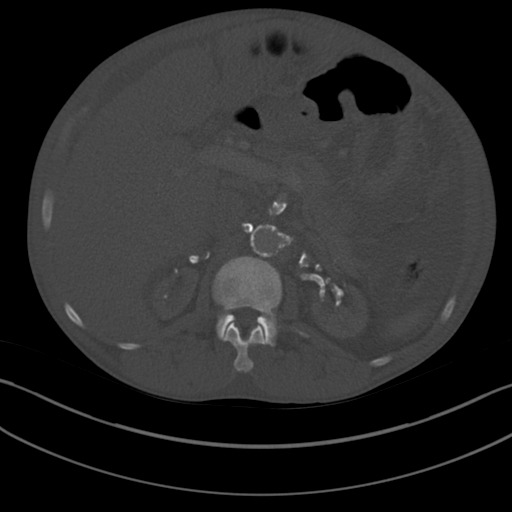
[im 72/98  soft-tissue]
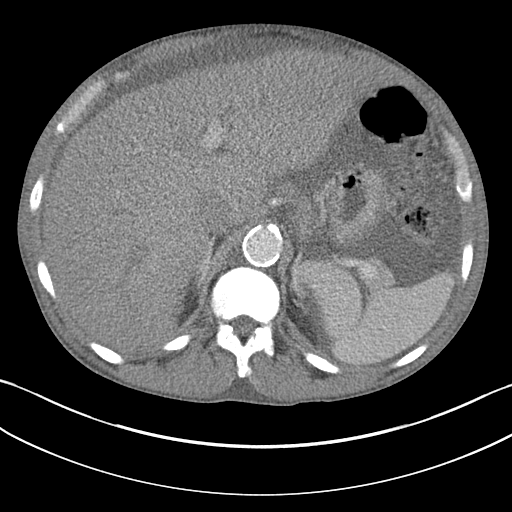
[im 78/98  soft-tissue]
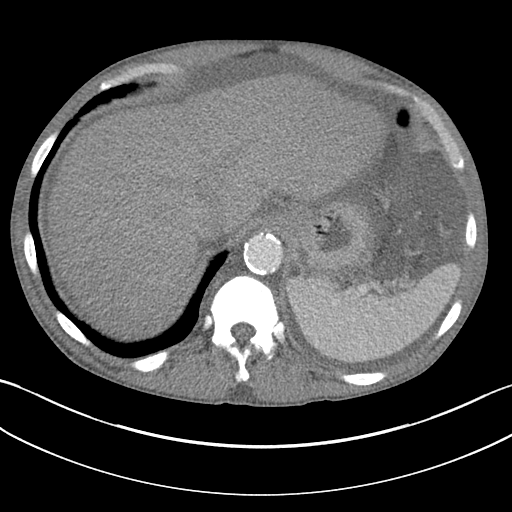
[im 85/98  soft-tissue]
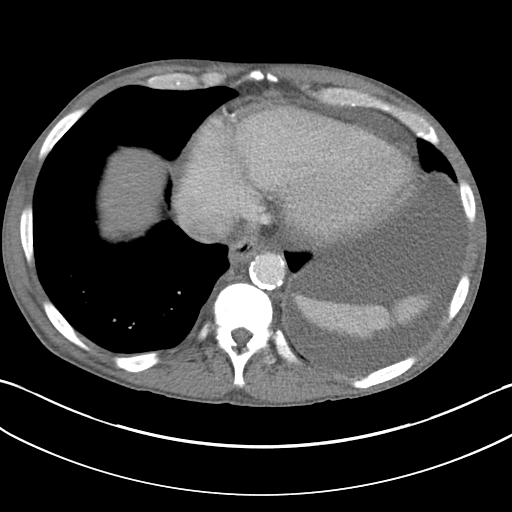
[im 91/98  soft-tissue]
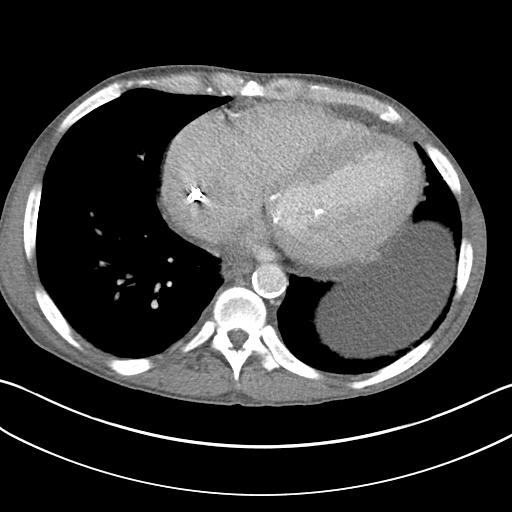

[Series 5: coronal st · coronal · 0.72mm/px · 3 of 98 slices shown]
[im 33/98  soft-tissue]
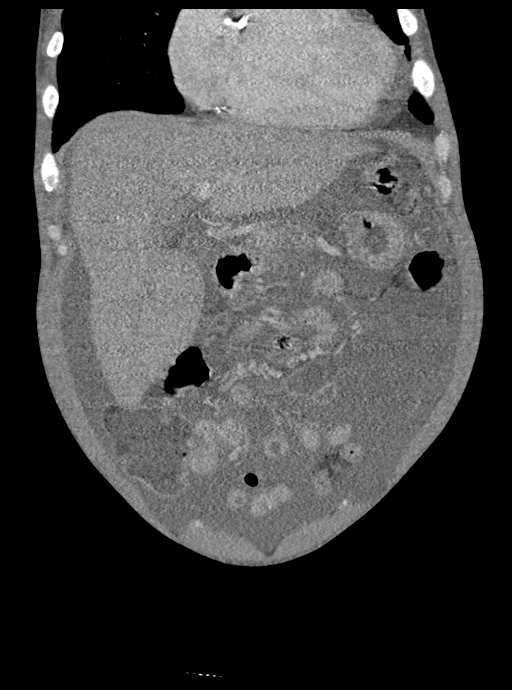
[im 44/98  soft-tissue]
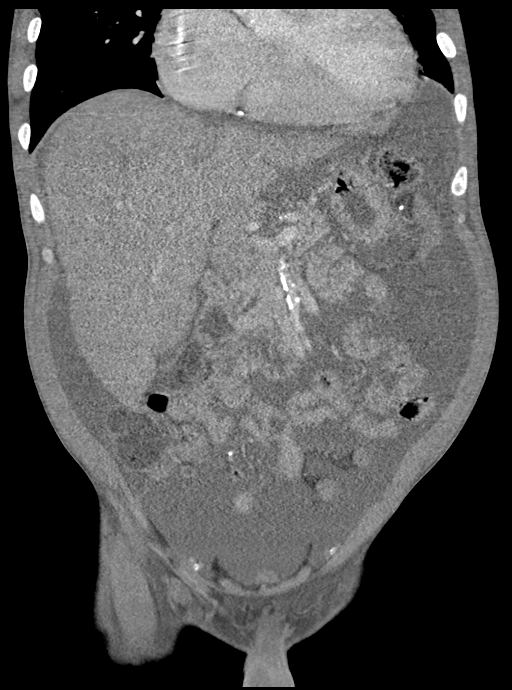
[im 54/98  soft-tissue]
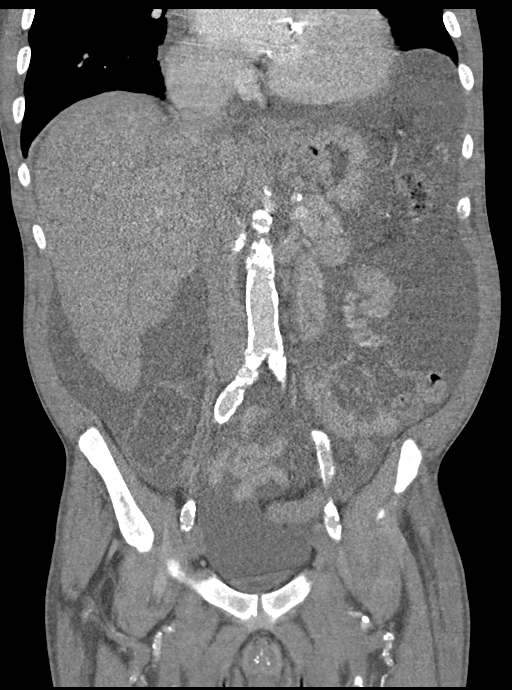

[16 of 46 positions shown; findings below may reference images not displayed]

FINDINGS: Lower chest: Cardiomegaly. Central line with tip in the right
atrium. Overall small pericardial effusion, stable. Extensive
atherosclerotic calcification of the coronaries and aorta.

Hepatobiliary: Lobulated liver surface and large caudate lobe
suggesting cirrhosis. No focal mass. Patent portal vein.No evidence
of biliary obstruction or stone.

Pancreas: Stable

Spleen: Unremarkable.

Adrenals/Urinary Tract: Negative adrenals. Marked bilateral renal
atrophy with small cystic densities. Patient has history of
end-stage renal disease. Unremarkable bladder.

Stomach/Bowel: No obstruction. No visible inflammation. There is a
clip noted within the rectum.

Vascular/Lymphatic: No acute vascular abnormality. Severe
atherosclerosis with confluent calcification along the aorta and
visceral branches. No mass or adenopathy.

Reproductive:No pathologic findings.

Other: There is a large volume ascites without visible loculation.
Chronic peritoneal thickening in the pelvis, seen since at least

Musculoskeletal: Generalized bony sclerosis, likely renal
osteodystrophy.
IMPRESSION: 1. No acute finding.
2. Large volume ascites. Chronic peritoneal thickening in the
pelvis, please correlate with recent paracentesis results.
3. Suspected cirrhosis.
4.  Aortic Atherosclerosis ([id number]).  Coronary atherosclerosis.

## 2019-04-27 DIAGNOSIS — D509 Iron deficiency anemia, unspecified: Secondary | ICD-10-CM | POA: Diagnosis not present

## 2019-04-27 DIAGNOSIS — Z992 Dependence on renal dialysis: Secondary | ICD-10-CM | POA: Diagnosis not present

## 2019-04-27 DIAGNOSIS — N2581 Secondary hyperparathyroidism of renal origin: Secondary | ICD-10-CM | POA: Diagnosis not present

## 2019-04-27 DIAGNOSIS — N186 End stage renal disease: Secondary | ICD-10-CM | POA: Diagnosis not present

## 2019-04-27 DIAGNOSIS — D631 Anemia in chronic kidney disease: Secondary | ICD-10-CM | POA: Diagnosis not present

## 2019-04-28 IMAGING — DX DG CHEST 2V
2 series · 2 of 2 positions shown · non-contrast
Comparison: 10/03/2017

CLINICAL DATA: Sharp abdominal pain for 3 days.  Dyspnea.

EXAM:
CHEST - 2 VIEW

[chest lat]
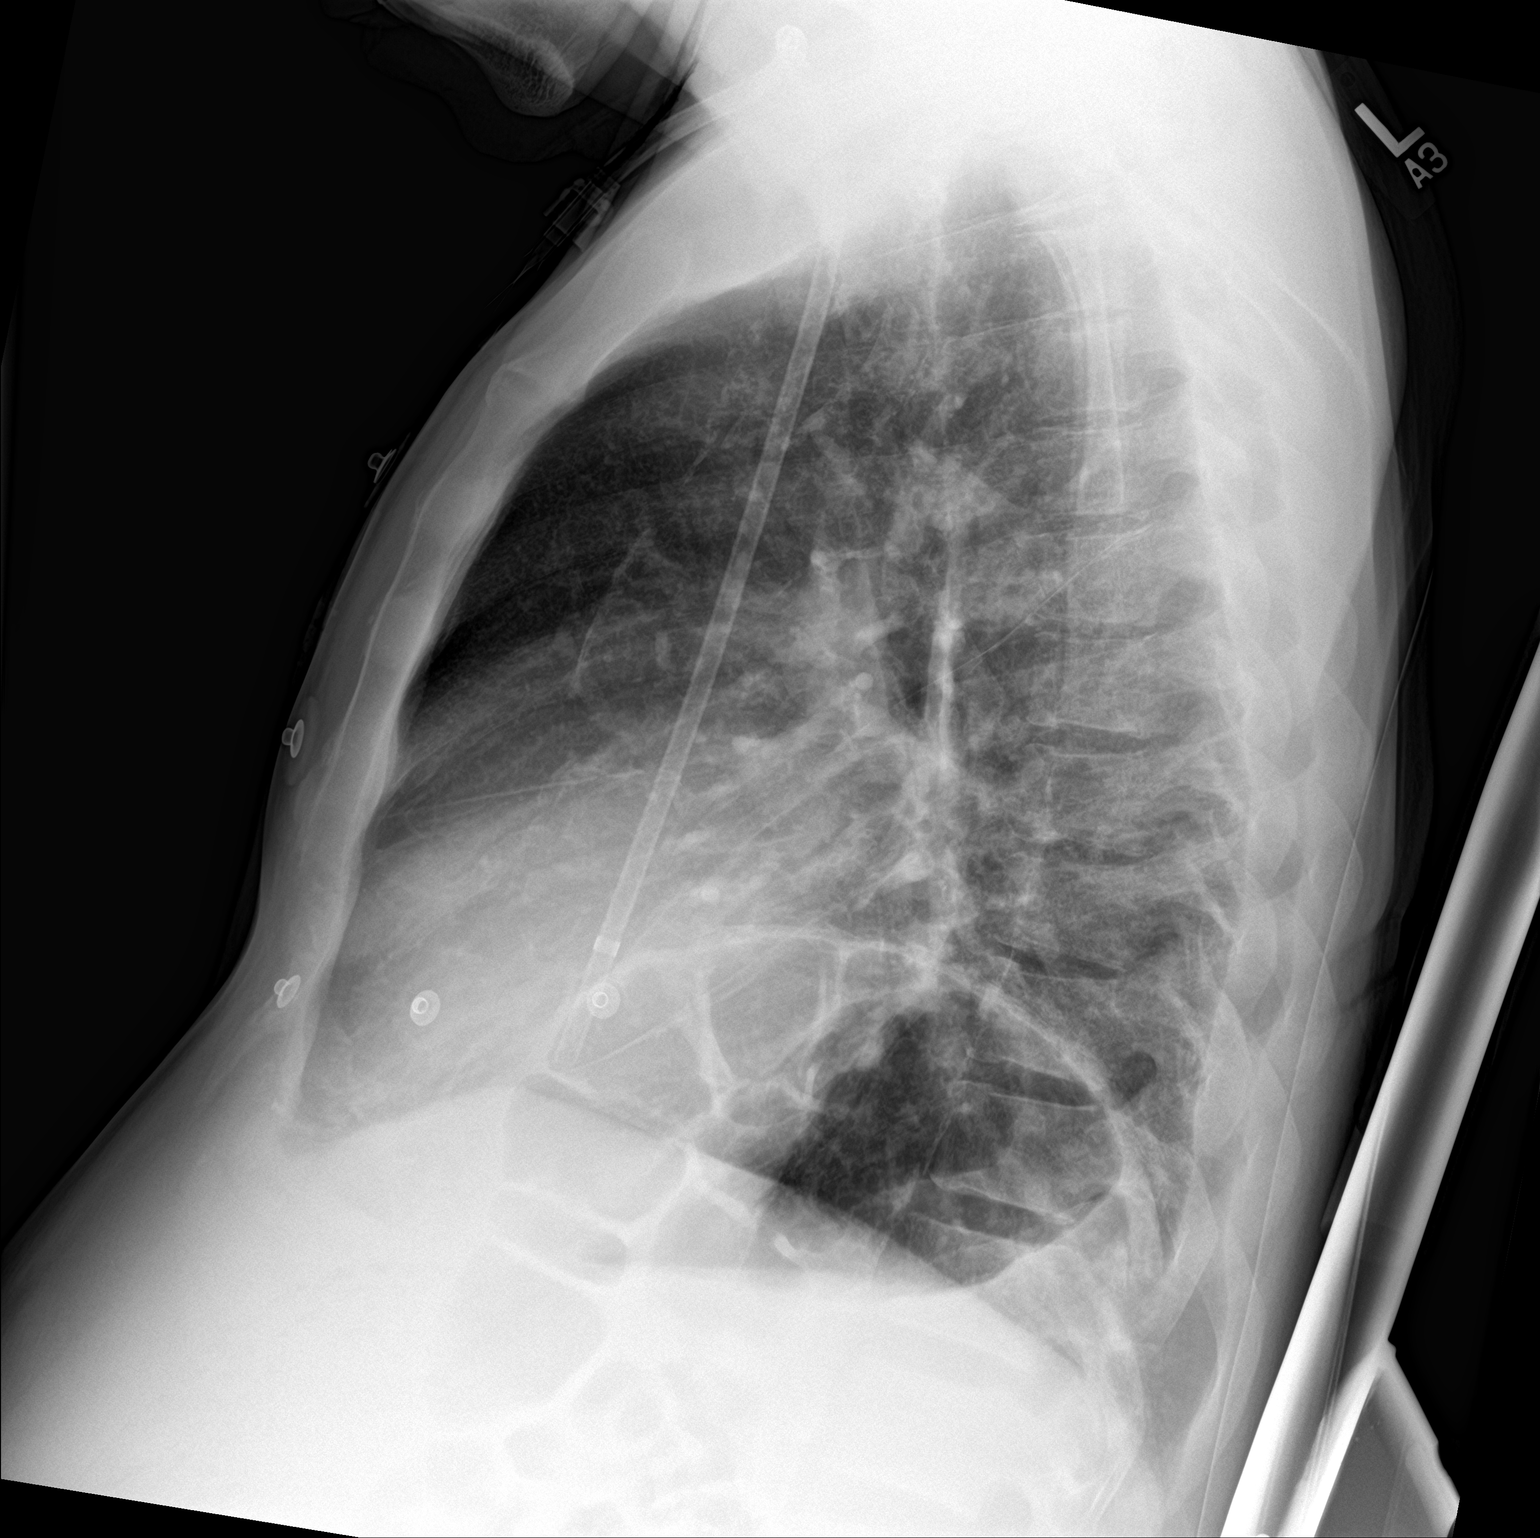

[chest ap]
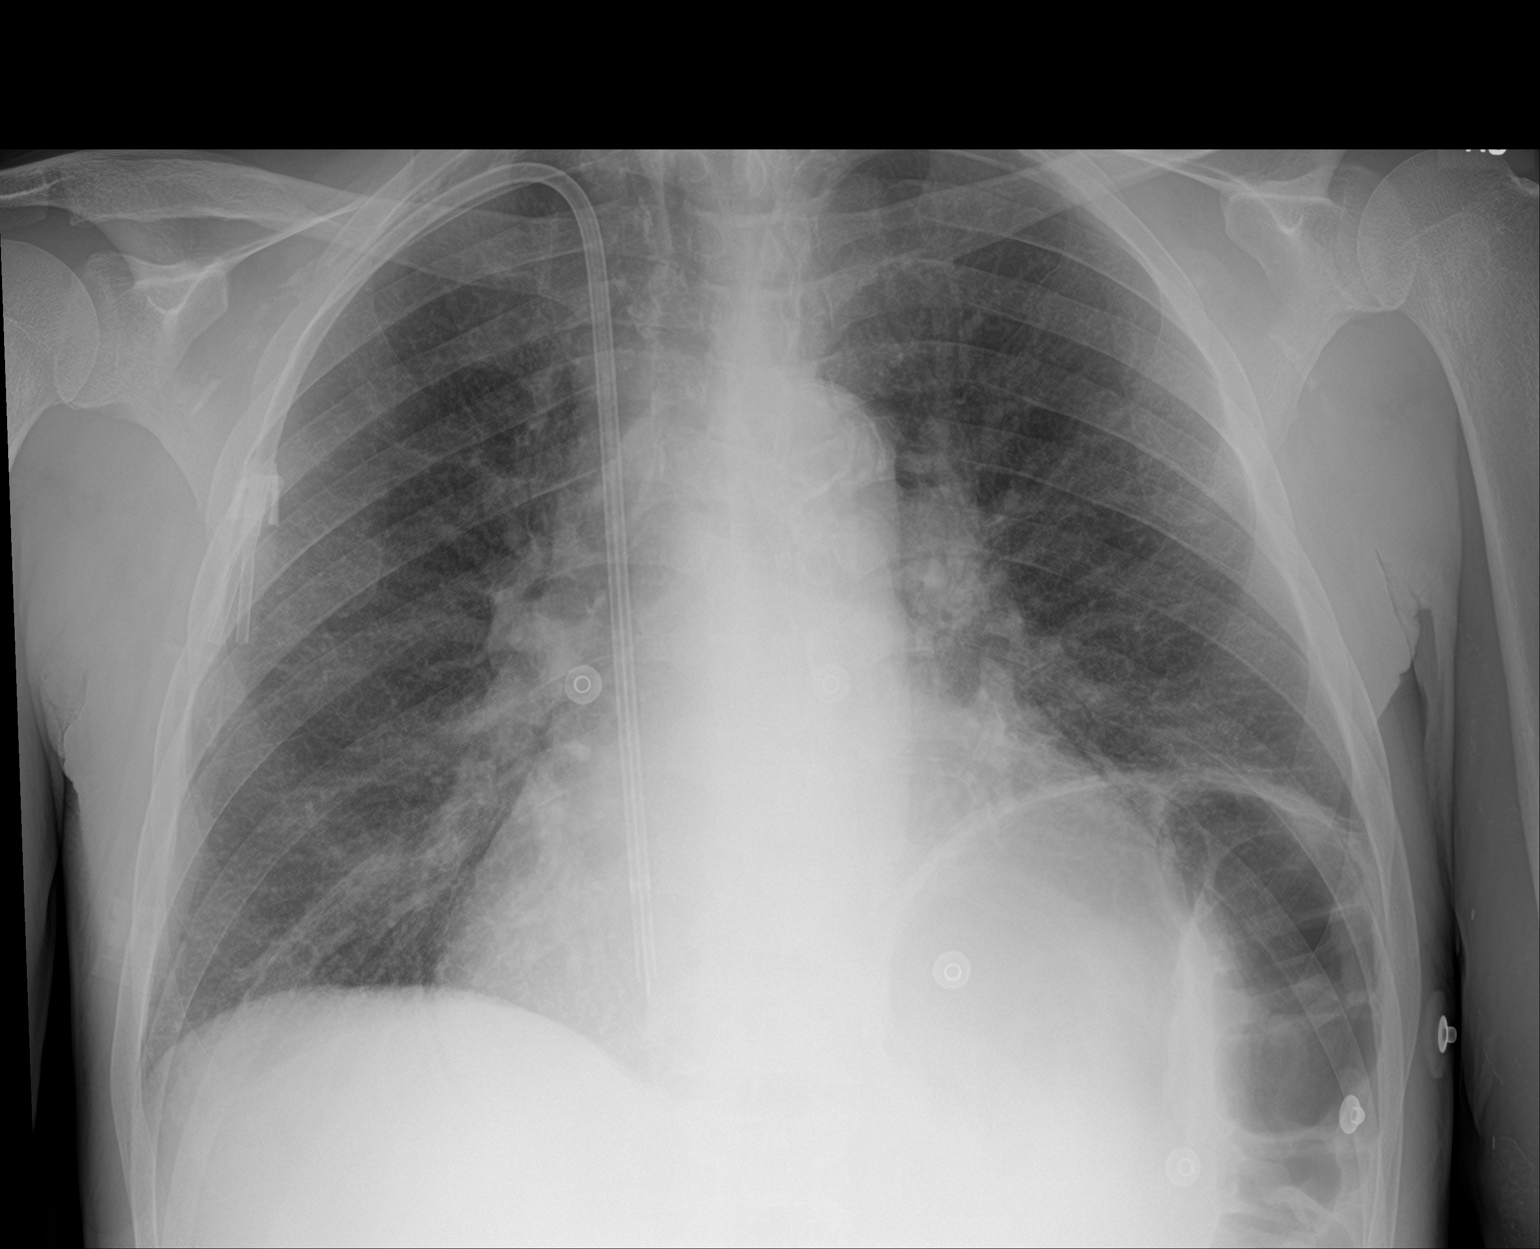

[2 of 2 positions shown; findings below may reference images not displayed]

FINDINGS: Stable cardiomegaly with aortic atherosclerosis. Dialysis catheter
tip from right IJ approach terminates in the mid right atrium.
Interstitial edema is noted with left basilar atelectasis and
chronic elevation of the left hemidiaphragm. No acute nor suspicious
osseous abnormality.
IMPRESSION: 1. Stable cardiomegaly with mild interstitial pulmonary edema,
slightly worse than on comparison. No effusion or pneumothorax.
2. Aortic atherosclerosis without aneurysm.

## 2019-04-29 IMAGING — US IR PARACENTESIS
1 series · 2 of 2 positions shown · non-contrast
Comparison: none

INDICATION: Patient with recurrent ascites. Request is made for diagnostic and
therapeutic paracentesis.

[Series 1: ir (id) (id)/(id)/(id) ir · 2 of 2 slices shown]
[im 1/2]
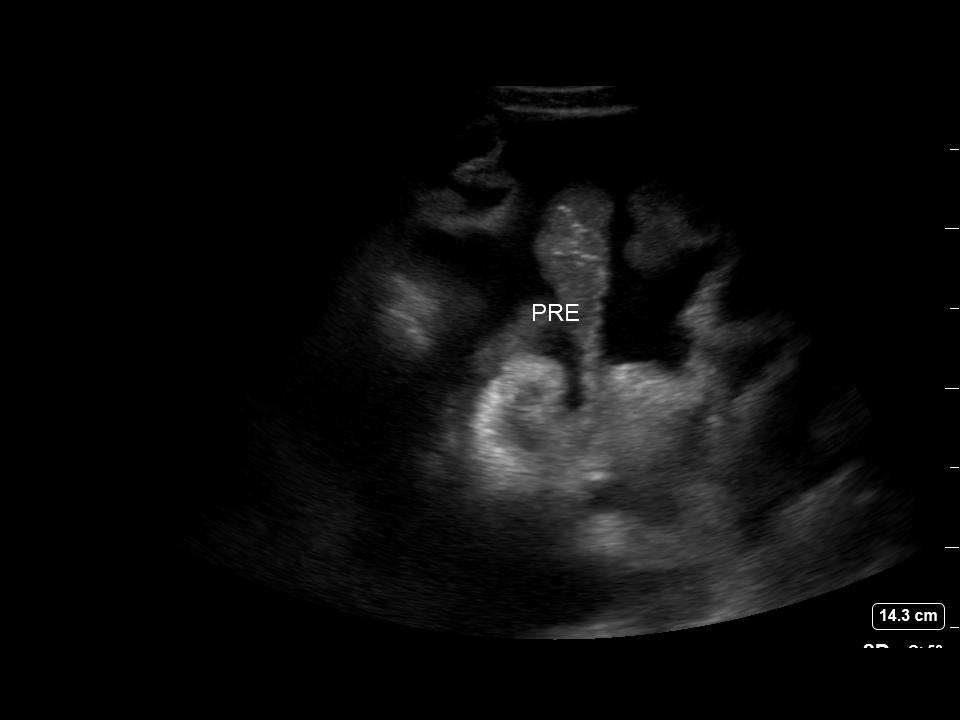
[im 2/2]
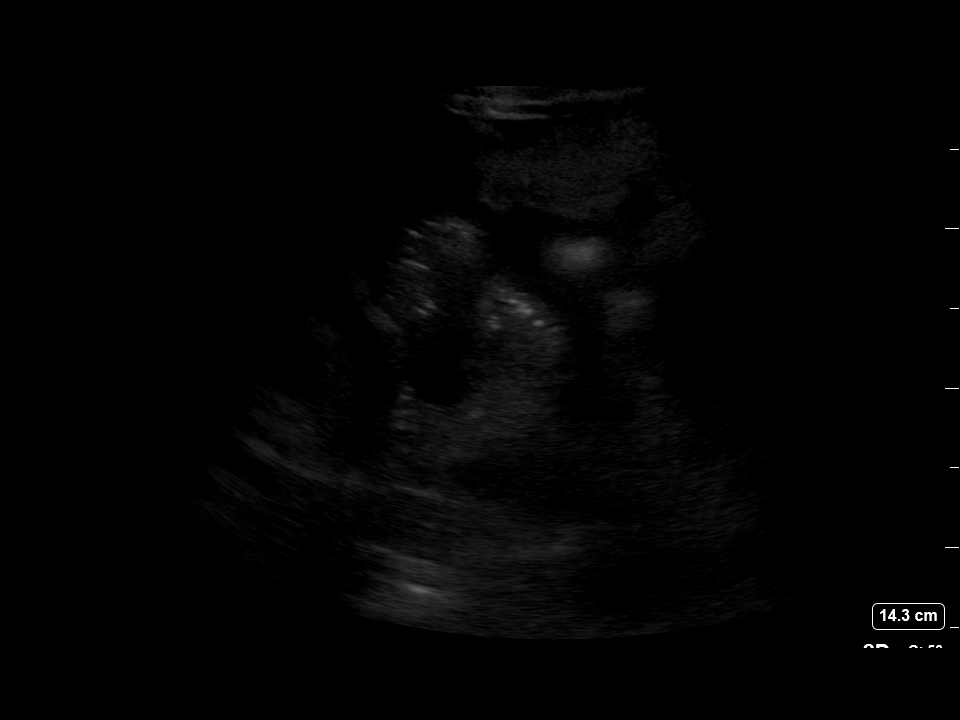

[2 of 2 positions shown; findings below may reference images not displayed]

EXAM:
ULTRASOUND GUIDED DIAGNOSTIC AND THERAPEUTIC PARACENTESIS

MEDICATIONS:
10 mL 2% lidocaine

COMPLICATIONS:
None immediate.

PROCEDURE:
Informed written consent was obtained from the patient after a
discussion of the risks, benefits and alternatives to treatment. A
timeout was performed prior to the initiation of the procedure.

Initial ultrasound scanning demonstrates a small amount of ascites
within the left lateral abdomen. The left lateral abdomen was
prepped and draped in the usual sterile fashion. 2% lidocaine was
used for local anesthesia.

Following this, a 19 gauge, 7-cm, Yueh catheter was introduced. An
ultrasound image was saved for documentation purposes. The
paracentesis was performed. The catheter was removed and a dressing
was applied. The patient tolerated the procedure well without
immediate post procedural complication.
FINDINGS: A total of approximately 5 mL of amber fluid was removed. Samples
were sent to the laboratory as requested by the clinical team.
IMPRESSION: Successful ultrasound-guided diagnostic and therapeutic paracentesis
yielding 5 mL liters of peritoneal fluid.

## 2019-04-30 ENCOUNTER — Ambulatory Visit: Payer: Medicare Other | Admitting: Cardiology

## 2019-04-30 DIAGNOSIS — N2581 Secondary hyperparathyroidism of renal origin: Secondary | ICD-10-CM | POA: Diagnosis not present

## 2019-04-30 DIAGNOSIS — N186 End stage renal disease: Secondary | ICD-10-CM | POA: Diagnosis not present

## 2019-04-30 DIAGNOSIS — D631 Anemia in chronic kidney disease: Secondary | ICD-10-CM | POA: Diagnosis not present

## 2019-04-30 DIAGNOSIS — D509 Iron deficiency anemia, unspecified: Secondary | ICD-10-CM | POA: Diagnosis not present

## 2019-04-30 DIAGNOSIS — Z992 Dependence on renal dialysis: Secondary | ICD-10-CM | POA: Diagnosis not present

## 2019-04-30 IMAGING — DX DG CHEST 1V PORT
1 series · 1 of 1 positions shown · non-contrast
Comparison: 10/27/2017.

CLINICAL DATA: Chest pain and shortness of breath for 1 day.

EXAM:
PORTABLE CHEST 1 VIEW

[chest ap]
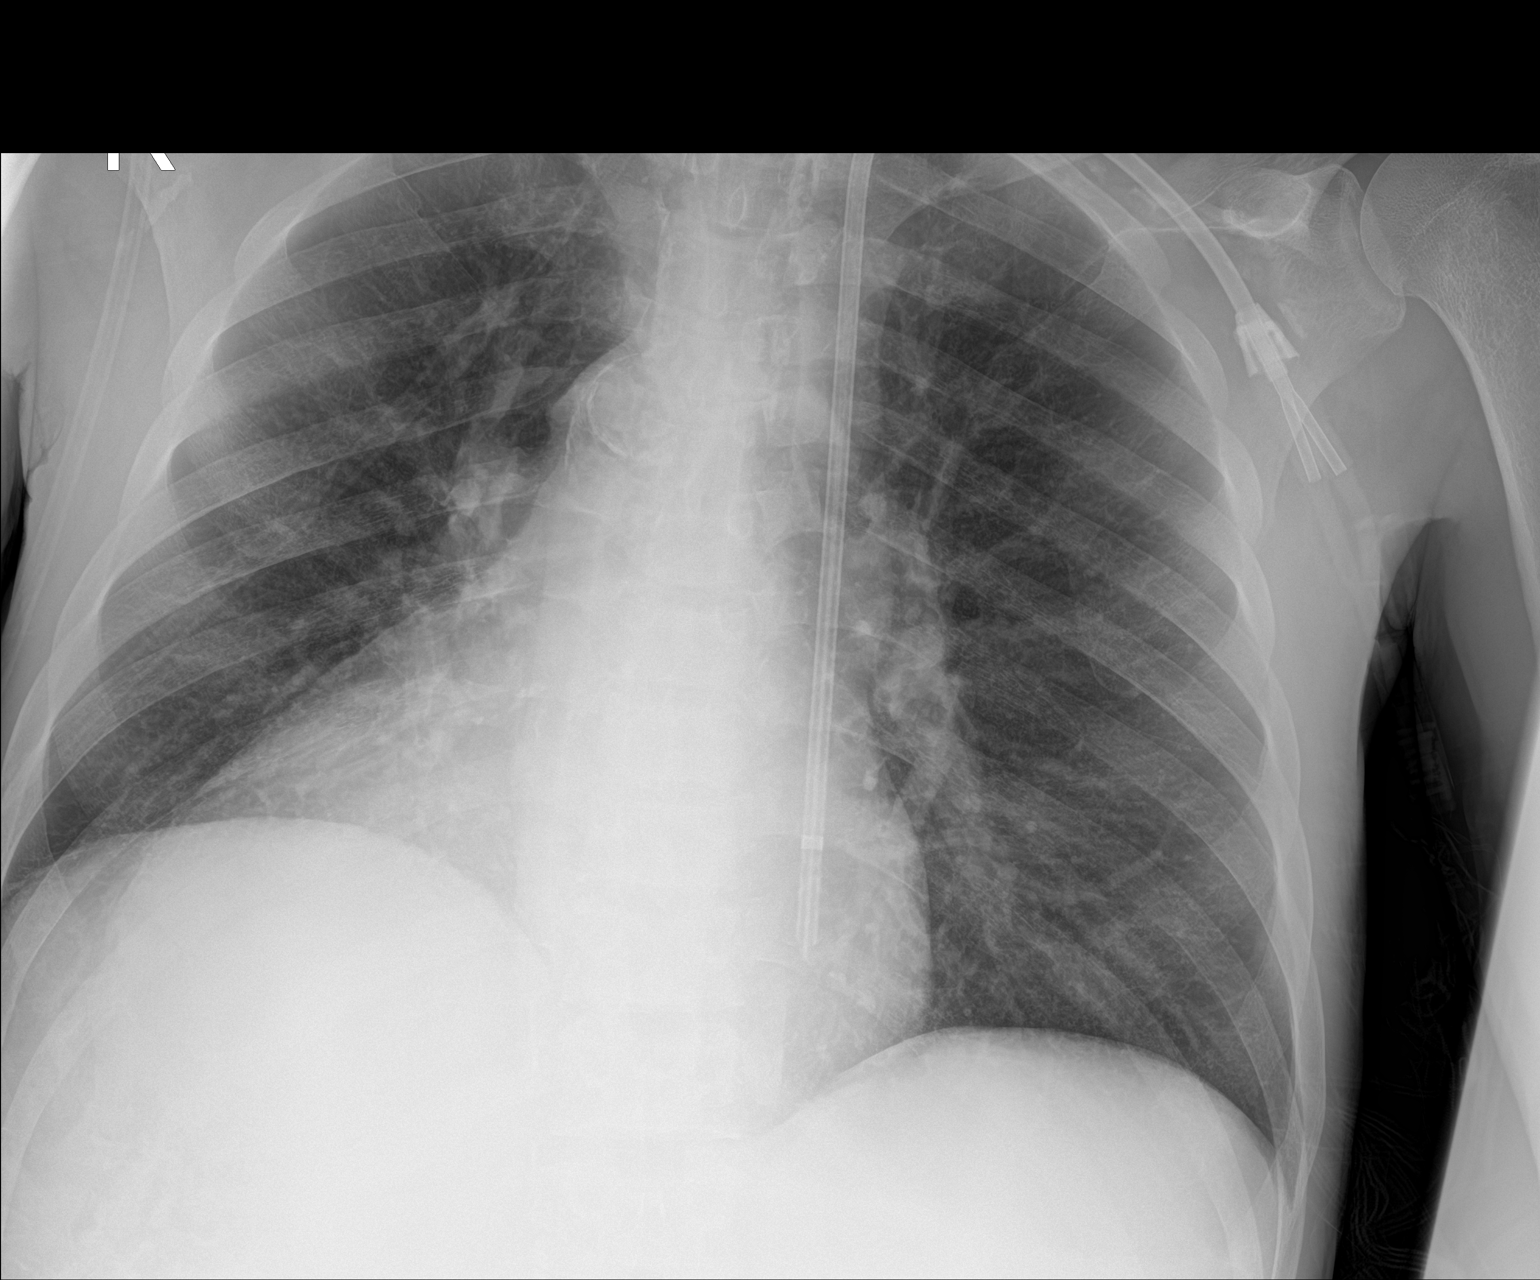

[1 of 1 positions shown; findings below may reference images not displayed]

FINDINGS: Stable enlarged cardiac silhouette. The right jugular catheter tip
is in the right atrium. The left hemidiaphragm remains elevated.
Clear lungs with normal vascularity.
IMPRESSION: 1. The right jugular catheter tip remains in the mid right atrium.
2. Stable cardiomegaly and elevation of the left hemidiaphragm.

## 2019-05-01 ENCOUNTER — Encounter (HOSPITAL_COMMUNITY): Payer: Self-pay | Admitting: Interventional Radiology

## 2019-05-01 ENCOUNTER — Ambulatory Visit (HOSPITAL_COMMUNITY)
Admission: RE | Admit: 2019-05-01 | Discharge: 2019-05-01 | Disposition: A | Discharge status: Home / Self Care | Payer: Medicare Other | Source: Ambulatory Visit | Attending: Emergency Medicine | Admitting: Emergency Medicine

## 2019-05-01 ENCOUNTER — Other Ambulatory Visit: Payer: Self-pay

## 2019-05-01 DIAGNOSIS — K7290 Hepatic failure, unspecified without coma: Secondary | ICD-10-CM | POA: Diagnosis not present

## 2019-05-01 DIAGNOSIS — R188 Other ascites: Secondary | ICD-10-CM | POA: Diagnosis not present

## 2019-05-01 HISTORY — PX: IR PARACENTESIS: IMG2679

## 2019-05-01 MED ORDER — LIDOCAINE HCL 1 % IJ SOLN
INTRAMUSCULAR | Status: AC
  Filled 2019-05-01: qty 20

## 2019-05-01 MED ORDER — LIDOCAINE HCL 1 % IJ SOLN
INTRAMUSCULAR | Status: DC | PRN
  Administered 2019-05-01: 10 mL

## 2019-05-01 NOTE — Procedures (Signed)
PROCEDURE SUMMARY:  Successful image-guided paracentesis from the right lower abdomen.  Yielded 3.5 liters of hazy yellow fluid.  No immediate complications.  EBL: zero Patient tolerated well.   Specimen was not sent for labs.  Please see imaging section of Epic for full dictation.  Joaquim Nam PA-C 05/01/2019 1:11 PM

## 2019-05-03 DIAGNOSIS — Z79899 Other long term (current) drug therapy: Secondary | ICD-10-CM | POA: Diagnosis not present

## 2019-05-03 DIAGNOSIS — K7460 Unspecified cirrhosis of liver: Secondary | ICD-10-CM | POA: Diagnosis not present

## 2019-05-03 DIAGNOSIS — I447 Left bundle-branch block, unspecified: Secondary | ICD-10-CM | POA: Diagnosis not present

## 2019-05-03 DIAGNOSIS — Z8619 Personal history of other infectious and parasitic diseases: Secondary | ICD-10-CM | POA: Diagnosis not present

## 2019-05-03 DIAGNOSIS — I480 Paroxysmal atrial fibrillation: Secondary | ICD-10-CM | POA: Diagnosis not present

## 2019-05-03 DIAGNOSIS — R1084 Generalized abdominal pain: Secondary | ICD-10-CM | POA: Diagnosis not present

## 2019-05-04 DIAGNOSIS — D509 Iron deficiency anemia, unspecified: Secondary | ICD-10-CM | POA: Diagnosis not present

## 2019-05-04 DIAGNOSIS — N2581 Secondary hyperparathyroidism of renal origin: Secondary | ICD-10-CM | POA: Diagnosis not present

## 2019-05-04 DIAGNOSIS — Z992 Dependence on renal dialysis: Secondary | ICD-10-CM | POA: Diagnosis not present

## 2019-05-04 DIAGNOSIS — D631 Anemia in chronic kidney disease: Secondary | ICD-10-CM | POA: Diagnosis not present

## 2019-05-04 DIAGNOSIS — N186 End stage renal disease: Secondary | ICD-10-CM | POA: Diagnosis not present

## 2019-05-06 IMAGING — US IR ABDOMEN US LIMITED
1 series · 4 of 4 positions shown · non-contrast
Comparison: 10/28/2017

CLINICAL DATA: Abdominal pain, recurrent abdominal ascites.
Paracentesis requested.

EXAM:
LIMITED ABDOMEN ULTRASOUND FOR ASCITES
TECHNIQUE: Limited ultrasound survey for ascites was performed in all four
abdominal quadrants.

[Series 1: ir abdomen us limited · 4 of 4 slices shown]
[im 1/4]
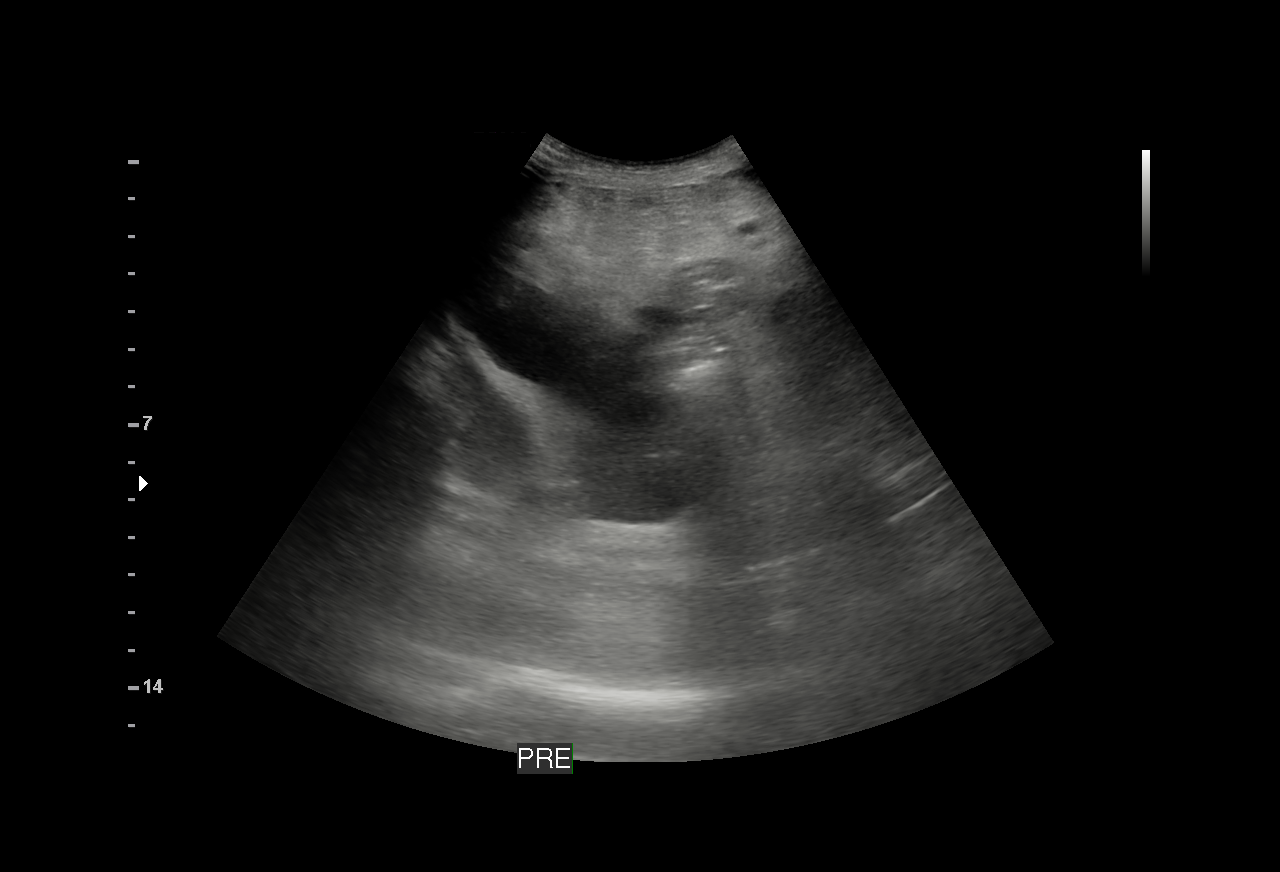
[im 2/4]
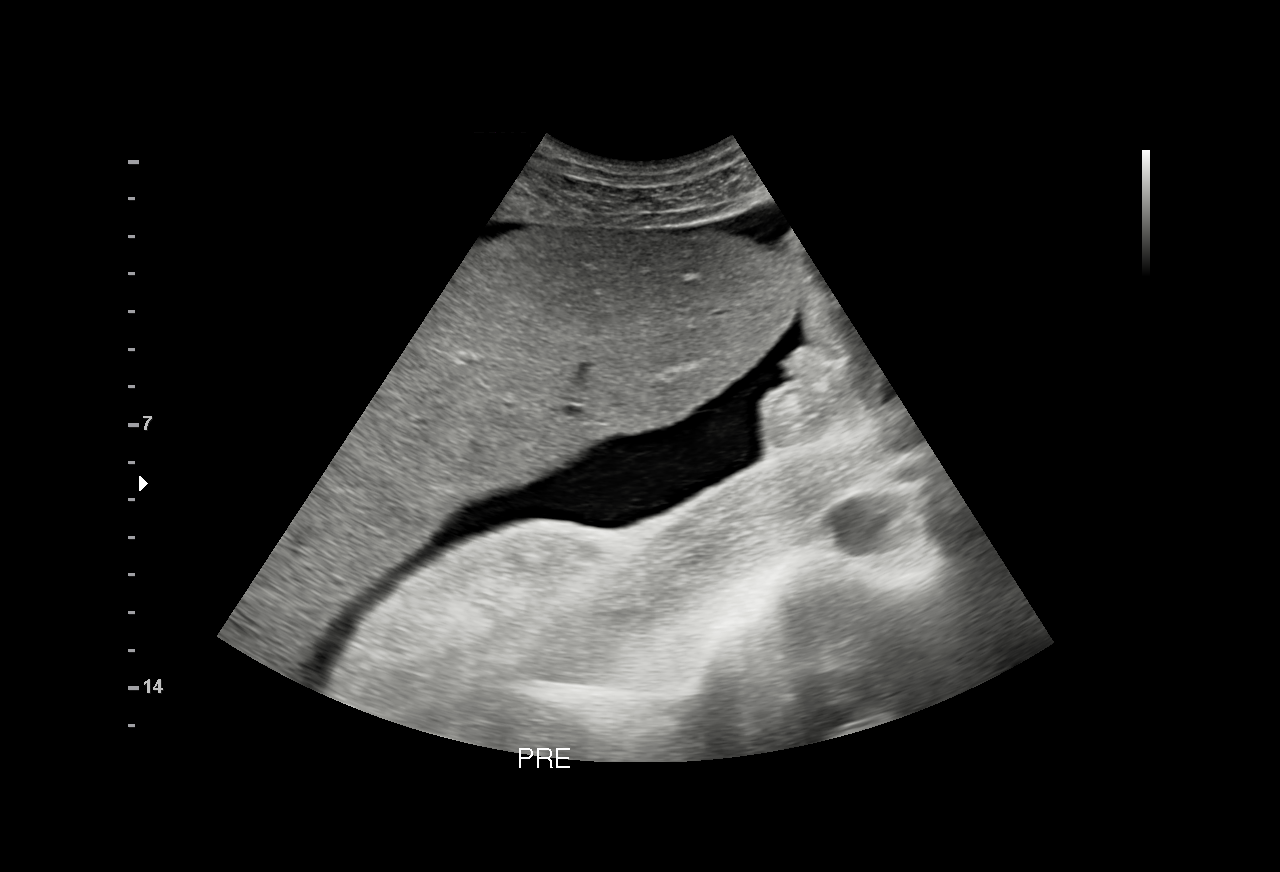
[im 3/4]
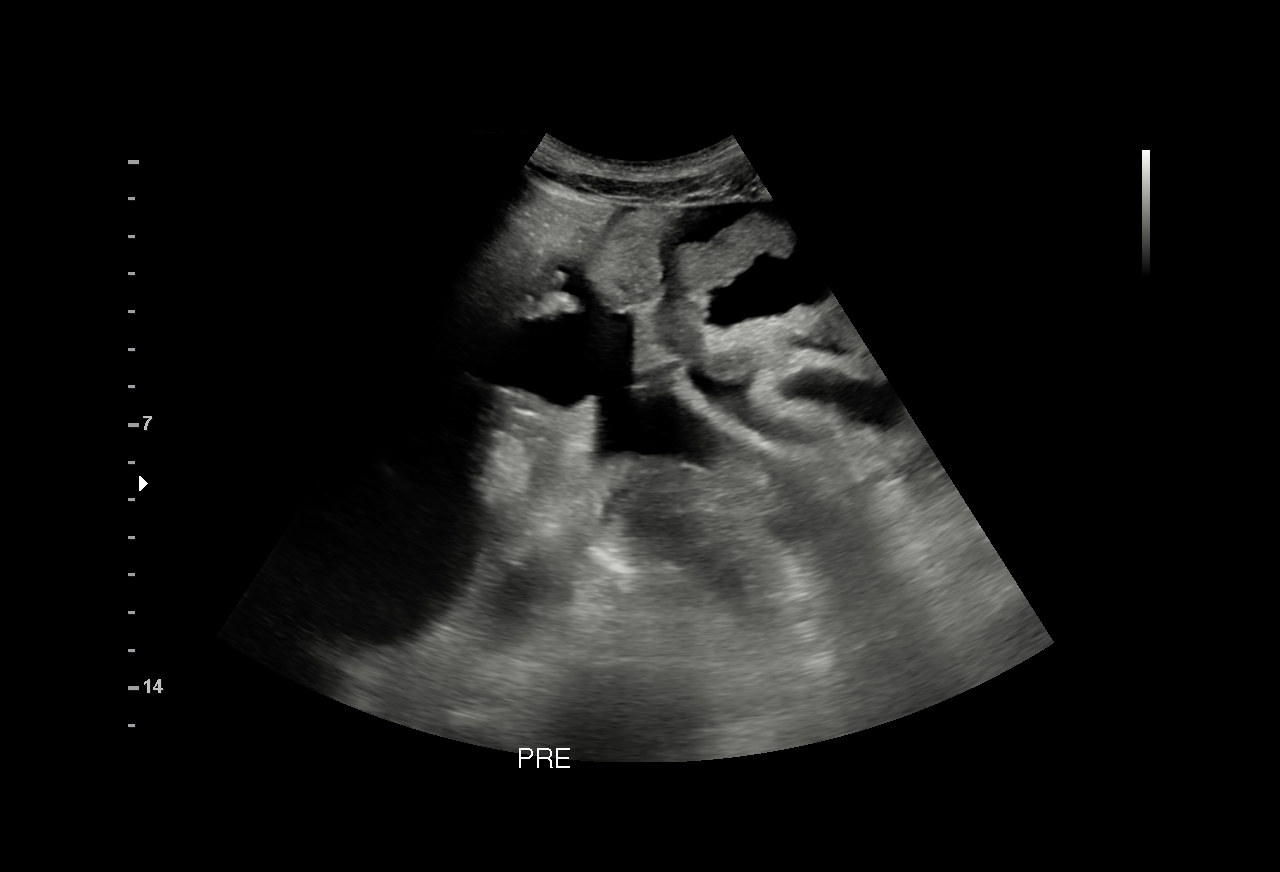
[im 4/4]
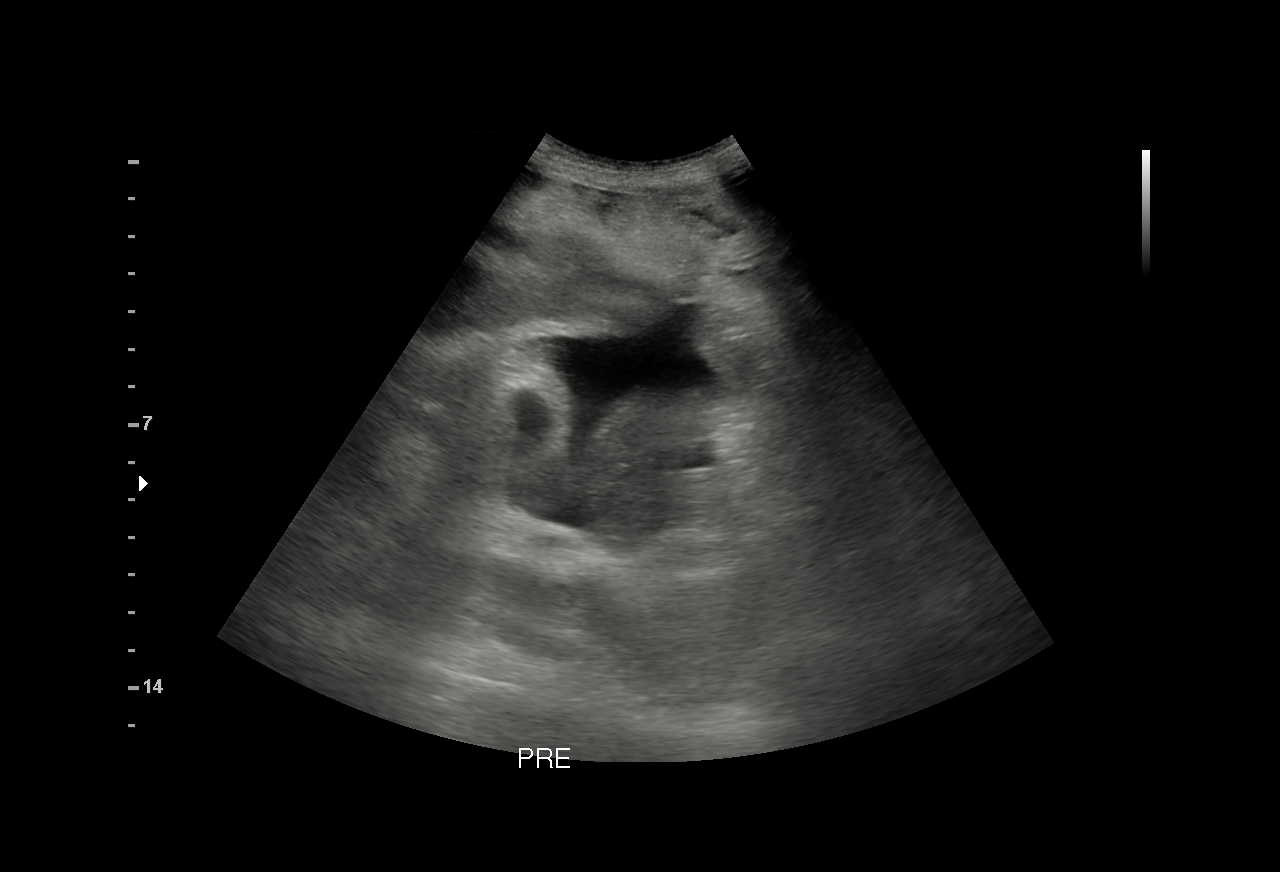

[4 of 4 positions shown; findings below may reference images not displayed]

FINDINGS: Survey ultrasound demonstrates a small amount of scattered abdominal
ascites. No large pocket to allow safe therapeutic paracentesis.
IMPRESSION: Small amount of scattered abdominal ascites. Therapeutic
paracentesis deferred.

## 2019-05-06 IMAGING — DX DG CHEST 1V PORT
1 series · 1 of 1 positions shown · non-contrast
Comparison: 10/29/2017

CLINICAL DATA: Productive cough with generalized weakness and
dyspnea.

EXAM:
PORTABLE CHEST 1 VIEW

[chest ap]
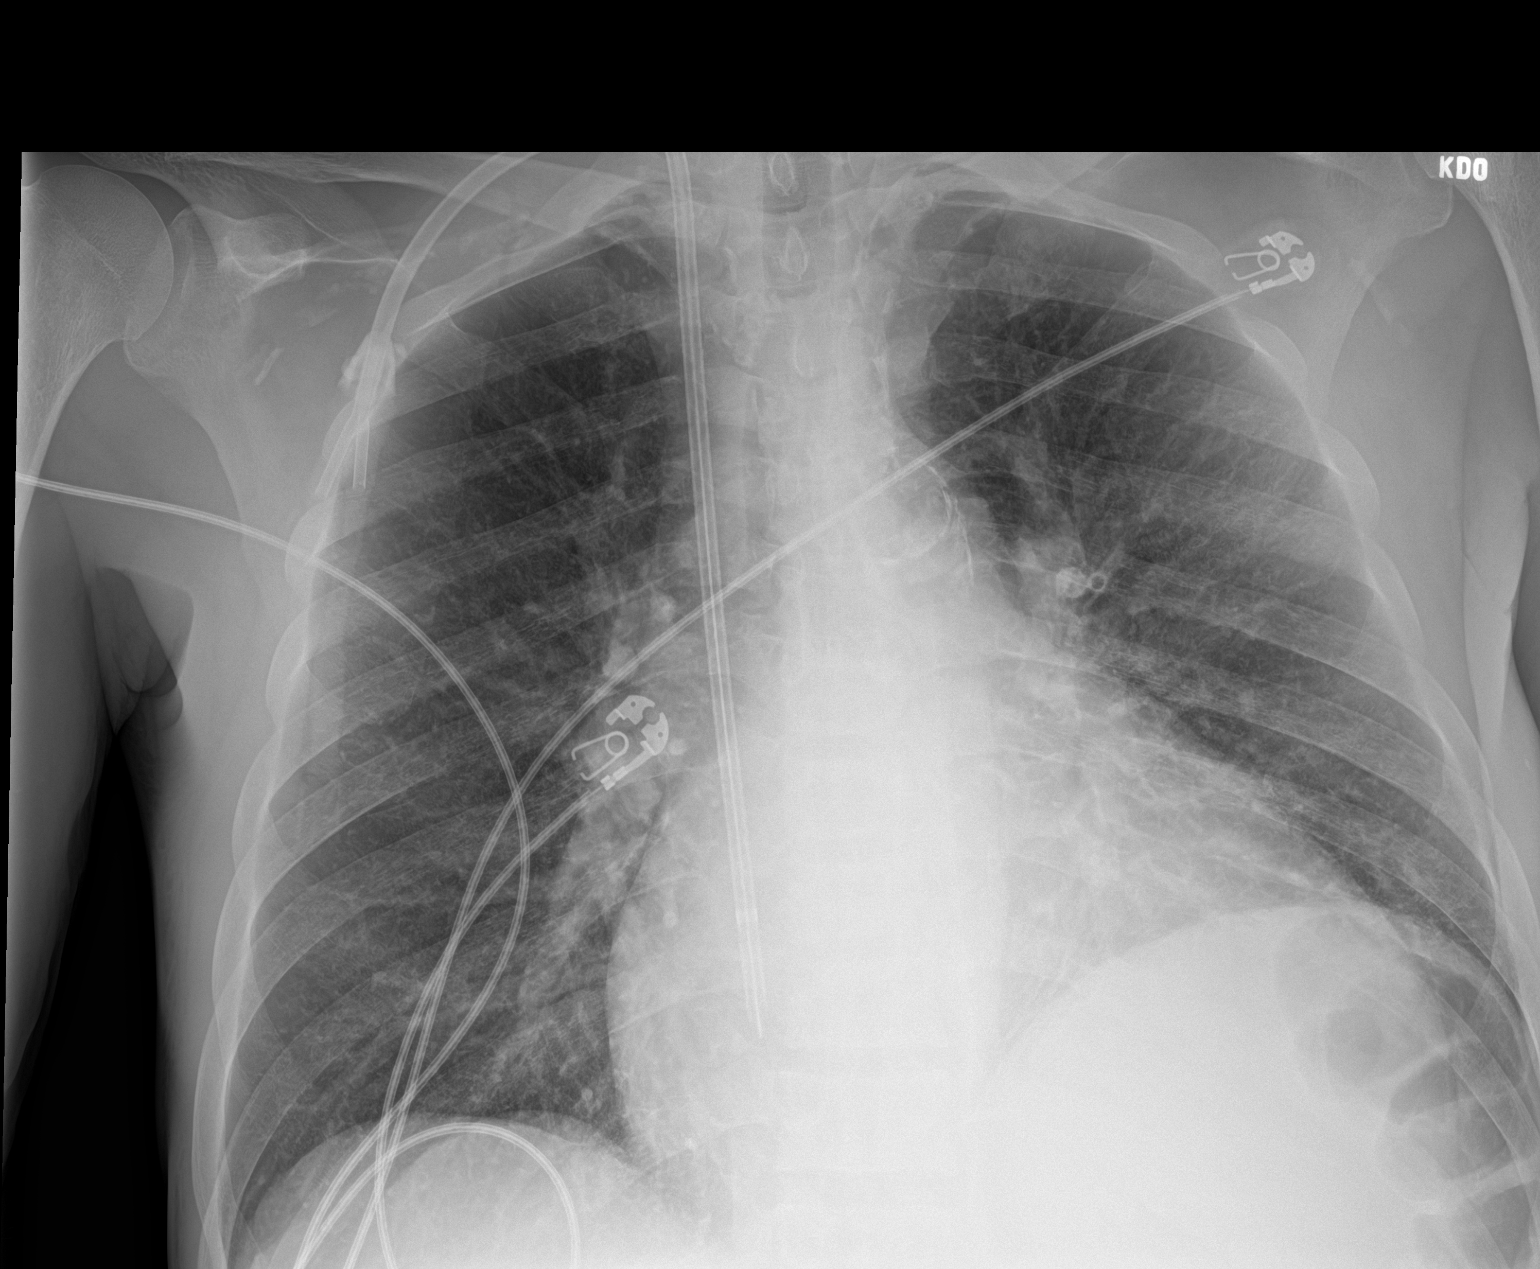

[1 of 1 positions shown; findings below may reference images not displayed]

FINDINGS: Stable cardiomegaly with aortic atherosclerosis. Mild interstitial
edema. No pulmonary consolidation. Left basilar atelectasis is
identified with slight elevation of the left hemidiaphragm, chronic
in appearance. Right IJ dialysis catheter tip remains in the mid
right atrium. No acute osseous abnormality.
IMPRESSION: Cardiomegaly with mild interstitial edema. Aortic atherosclerosis.
Dialysis catheter tip in the mid right atrium.

## 2019-05-07 ENCOUNTER — Emergency Department (HOSPITAL_COMMUNITY)
Admission: EM | Admit: 2019-05-07 | Discharge: 2019-05-07 | Disposition: A | Discharge status: Home / Self Care | Payer: Medicare Other | Attending: Emergency Medicine | Admitting: Emergency Medicine

## 2019-05-07 ENCOUNTER — Encounter (HOSPITAL_COMMUNITY): Payer: Self-pay | Admitting: Emergency Medicine

## 2019-05-07 ENCOUNTER — Emergency Department (HOSPITAL_COMMUNITY): Payer: Medicare Other

## 2019-05-07 DIAGNOSIS — F17210 Nicotine dependence, cigarettes, uncomplicated: Secondary | ICD-10-CM | POA: Insufficient documentation

## 2019-05-07 DIAGNOSIS — J449 Chronic obstructive pulmonary disease, unspecified: Secondary | ICD-10-CM | POA: Diagnosis not present

## 2019-05-07 DIAGNOSIS — R197 Diarrhea, unspecified: Secondary | ICD-10-CM | POA: Insufficient documentation

## 2019-05-07 DIAGNOSIS — N186 End stage renal disease: Secondary | ICD-10-CM | POA: Diagnosis not present

## 2019-05-07 DIAGNOSIS — R1084 Generalized abdominal pain: Secondary | ICD-10-CM | POA: Insufficient documentation

## 2019-05-07 DIAGNOSIS — J9611 Chronic respiratory failure with hypoxia: Secondary | ICD-10-CM | POA: Insufficient documentation

## 2019-05-07 DIAGNOSIS — R1010 Upper abdominal pain, unspecified: Secondary | ICD-10-CM | POA: Diagnosis not present

## 2019-05-07 DIAGNOSIS — R110 Nausea: Secondary | ICD-10-CM | POA: Diagnosis not present

## 2019-05-07 DIAGNOSIS — I2510 Atherosclerotic heart disease of native coronary artery without angina pectoris: Secondary | ICD-10-CM | POA: Insufficient documentation

## 2019-05-07 DIAGNOSIS — I252 Old myocardial infarction: Secondary | ICD-10-CM | POA: Insufficient documentation

## 2019-05-07 DIAGNOSIS — I480 Paroxysmal atrial fibrillation: Secondary | ICD-10-CM | POA: Diagnosis not present

## 2019-05-07 DIAGNOSIS — I4892 Unspecified atrial flutter: Secondary | ICD-10-CM | POA: Insufficient documentation

## 2019-05-07 DIAGNOSIS — D631 Anemia in chronic kidney disease: Secondary | ICD-10-CM | POA: Diagnosis not present

## 2019-05-07 DIAGNOSIS — K7460 Unspecified cirrhosis of liver: Secondary | ICD-10-CM | POA: Diagnosis not present

## 2019-05-07 DIAGNOSIS — R188 Other ascites: Secondary | ICD-10-CM | POA: Insufficient documentation

## 2019-05-07 DIAGNOSIS — Z992 Dependence on renal dialysis: Secondary | ICD-10-CM | POA: Insufficient documentation

## 2019-05-07 DIAGNOSIS — J9811 Atelectasis: Secondary | ICD-10-CM | POA: Diagnosis not present

## 2019-05-07 DIAGNOSIS — D509 Iron deficiency anemia, unspecified: Secondary | ICD-10-CM | POA: Diagnosis not present

## 2019-05-07 DIAGNOSIS — Z79899 Other long term (current) drug therapy: Secondary | ICD-10-CM | POA: Diagnosis not present

## 2019-05-07 DIAGNOSIS — I120 Hypertensive chronic kidney disease with stage 5 chronic kidney disease or end stage renal disease: Secondary | ICD-10-CM | POA: Insufficient documentation

## 2019-05-07 DIAGNOSIS — R1111 Vomiting without nausea: Secondary | ICD-10-CM | POA: Diagnosis not present

## 2019-05-07 DIAGNOSIS — J439 Emphysema, unspecified: Secondary | ICD-10-CM | POA: Diagnosis not present

## 2019-05-07 DIAGNOSIS — N2581 Secondary hyperparathyroidism of renal origin: Secondary | ICD-10-CM | POA: Diagnosis not present

## 2019-05-07 LAB — COMPREHENSIVE METABOLIC PANEL
ALT: 12 U/L (ref 0–44)
AST: 24 U/L (ref 15–41)
Albumin: 3.5 g/dL (ref 3.5–5.0)
Alkaline Phosphatase: 94 U/L (ref 38–126)
Anion gap: 15 (ref 5–15)
BUN: 37 mg/dL — ABNORMAL HIGH (ref 6–20)
CO2: 27 mmol/L (ref 22–32)
Calcium: 9.6 mg/dL (ref 8.9–10.3)
Chloride: 90 mmol/L — ABNORMAL LOW (ref 98–111)
Creatinine, Ser: 9.87 mg/dL — ABNORMAL HIGH (ref 0.61–1.24)
GFR calc Af Amer: 6 mL/min — ABNORMAL LOW (ref >60)
GFR calc non Af Amer: 5 mL/min — ABNORMAL LOW (ref >60)
Glucose, Bld: 114 mg/dL — ABNORMAL HIGH (ref 70–99)
Potassium: 5.7 mmol/L — ABNORMAL HIGH (ref 3.5–5.1)
Sodium: 132 mmol/L — ABNORMAL LOW (ref 135–145)
Total Bilirubin: 1 mg/dL (ref 0.3–1.2)
Total Protein: 6.5 g/dL (ref 6.5–8.1)

## 2019-05-07 LAB — CBC WITH DIFFERENTIAL/PLATELET
Abs Immature Granulocytes: 0.03 10*3/uL (ref 0.00–0.07)
Basophils Absolute: 0.1 10*3/uL (ref 0.0–0.1)
Basophils Relative: 2 %
Eosinophils Absolute: 0.2 10*3/uL (ref 0.0–0.5)
Eosinophils Relative: 2 %
HCT: 33.6 % — ABNORMAL LOW (ref 39.0–52.0)
Hemoglobin: 11.7 g/dL — ABNORMAL LOW (ref 13.0–17.0)
Immature Granulocytes: 0 %
Lymphocytes Relative: 16 %
Lymphs Abs: 1.4 10*3/uL (ref 0.7–4.0)
MCH: 28.3 pg (ref 26.0–34.0)
MCHC: 34.8 g/dL (ref 30.0–36.0)
MCV: 81.4 fL (ref 80.0–100.0)
Monocytes Absolute: 0.8 10*3/uL (ref 0.1–1.0)
Monocytes Relative: 9 %
Neutro Abs: 5.8 10*3/uL (ref 1.7–7.7)
Neutrophils Relative %: 71 %
Platelets: 172 10*3/uL (ref 150–400)
RBC: 4.13 MIL/uL — ABNORMAL LOW (ref 4.22–5.81)
RDW: 13.4 % (ref 11.5–15.5)
WBC: 8.3 10*3/uL (ref 4.0–10.5)
nRBC: 0 % (ref 0.0–0.2)

## 2019-05-07 LAB — LIPASE, BLOOD: Lipase: 18 U/L (ref 11–51)

## 2019-05-07 MED ORDER — ONDANSETRON HCL 4 MG/2ML IJ SOLN
4.0000 mg | Freq: Once | INTRAMUSCULAR | Status: AC
  Administered 2019-05-07: 4 mg via INTRAVENOUS
  Filled 2019-05-07: qty 2

## 2019-05-07 MED ORDER — HYDROMORPHONE HCL 1 MG/ML IJ SOLN
1.0000 mg | Freq: Once | INTRAMUSCULAR | Status: AC
  Administered 2019-05-07: 1 mg via INTRAVENOUS
  Filled 2019-05-07: qty 1

## 2019-05-07 MED ORDER — DIPHENHYDRAMINE HCL 50 MG/ML IJ SOLN
25.0000 mg | Freq: Once | INTRAMUSCULAR | Status: AC
  Administered 2019-05-07: 25 mg via INTRAVENOUS
  Filled 2019-05-07: qty 1

## 2019-05-07 MED ORDER — ONDANSETRON 4 MG PO TBDP: 4.0000 mg | ORAL_TABLET | Freq: Three times a day (TID) | ORAL | 1 refills | Status: DC | PRN

## 2019-05-07 MED ORDER — IOHEXOL 300 MG/ML  SOLN
100.0000 mL | Freq: Once | INTRAMUSCULAR | Status: AC | PRN
  Administered 2019-05-07: 100 mL via INTRAVENOUS

## 2019-05-07 NOTE — ED Triage Notes (Signed)
Pt has belly pain. Gets tapped every 2 weeks in belly. He reports nausea and diarr. Was about to get his dialysis when he started dry heaving.

## 2019-05-07 NOTE — ED Notes (Signed)
ED Provider at bedside. 

## 2019-05-07 NOTE — ED Notes (Signed)
Patient is resting comfortably. 

## 2019-05-07 NOTE — Discharge Instructions (Addendum)
Follow-up with [facility] gastroenterology.  Rearrange to have your dialysis done tomorrow.  Take Zofran as needed for the nausea.  Return for any new or worse symptoms.  Keep your appointment for your paracentesis next week.

## 2019-05-07 NOTE — ED Provider Notes (Signed)
[facility] EMERGENCY DEPARTMENT Provider Note   CSN: [id number] Arrival date & time: 05/07/19  3536     History   Chief Complaint Chief Complaint  Patient presents with   Abdominal Pain    HPI [name] is a 58 y.o. male.     Patient under palliative care.  Patient is a DNR.  Patient is a dialysis patient.  Patient was at dialysis today.  He is normally dialyzed Monday Wednesday and Friday.  He was dialyzed on Friday.  Today he did not get dialyzed because he was having dry heaves so they sent him here.  Patient states that on Saturday and Sunday he has been having dry heaves and also having diarrhea.  And having some abdominal discomfort all over.  Chart review shows that patient is seen for this frequently going all the way back to September 4.  Also cyst is seen recently here in October for similar complaint.  Patient known to have cirrhosis seems to be hepatitis C related patient also has ascites and gets his peritoneal fluid drained his ascites drained every 2 weeks.  He had that done this past Tuesday.  Patient states he is on prophylactic antibiotics but cannot remember which one it is to help prevent infection but not able to confirm that.  Patient denies any fevers.  Today's complaint is almost identical to the complaint that he had back in September.  Patient is followed by [facility] gastroenterology.     Past Medical History:  Diagnosis Date   Anemia    Anxiety    Arthritis    left shoulder   Atherosclerosis of aorta (HCC)    Cardiomegaly    Chest pain    DATE UNKNOWN, C/O PERIODICALLY   Cocaine abuse (HCC)    COPD exacerbation (HCC) 08/17/2016   Coronary artery disease    stent 02/22/17   ESRD (end stage renal disease) on dialysis ([place])    "[name]; MWF" (07/04/2017)   GERD (gastroesophageal reflux disease)    DATE UNKNOWN   Hemorrhoids    Hepatitis B, chronic (HCC)    Hepatitis C    History of kidney stones     Hyperkalemia    Hypertension    Kidney failure    Metabolic bone disease    Patient denies   Mitral stenosis    Myocardial infarction [facility])    Pneumonia    Pulmonary edema    Solitary rectal ulcer syndrome 07/2017   at flex sig for rectal bleeding   Tubular adenoma of colon     Patient Active Problem List   Diagnosis Date Noted   Melena    Pressure injury of skin 03/09/2019   Abdominal distention    Volume overload 12/28/2018   Sepsis ([place]) 09/12/2018   Atherosclerosis of native coronary artery of native heart without angina pectoris 03/11/2018   Benign neoplasm of cecum    Benign neoplasm of ascending colon    Benign neoplasm of descending colon    Benign neoplasm of rectum    Paroxysmal atrial fibrillation ([place]) 01/23/2018   Hx of colonic polyps 01/20/2018   ESRD (end stage renal disease) ([place]) 11/21/2017   GERD (gastroesophageal reflux disease) 11/16/2017   Liver cirrhosis ([place]) 11/15/2017   DNR (do not resuscitate)    Palliative care by specialist    Hyponatremia 11/04/2017   SBP (spontaneous bacterial peritonitis) ([place]) 10/30/2017   Liver disease, chronic 10/30/2017   SOB (shortness of breath)    Abdominal  pain 10/28/2017   Upper airway cough syndrome with flattening on f/v loop 10/13/17 c/w vcd 10/17/2017   Elevated diaphragm 10/13/2017   Ileus ([place]) 09/29/2017   Malnutrition of moderate degree 09/29/2017   Sinus congestion 09/03/2017   Symptomatic anemia 09/02/2017   Other cirrhosis of liver ([place]) 09/02/2017   Left bundle branch block 09/02/2017   Mitral stenosis 09/02/2017   Hematochezia 07/15/2017   Wide-complex tachycardia ([place])    Endotracheally intubated    ESRD on dialysis ([place]) 07/04/2017   Acute respiratory failure with hypoxia ([place]) 06/18/2017   CKD (chronic kidney disease) stage V requiring chronic dialysis ([place]) 06/18/2017   History of Cocaine abuse ([place]) 06/18/2017   Hypertension 06/18/2017    Infection of AV graft for dialysis ([place]) 06/18/2017   Anxiety 06/18/2017   Anemia due to chronic kidney disease 06/18/2017   Atrial flutter with rapid ventricular response ([place]) 06/18/2017   Personality disorder ([place]) 06/13/2017   Cellulitis 06/12/2017   Adjustment disorder with mixed anxiety and depressed mood 06/10/2017   Suicidal ideation 06/10/2017   Arm wound, left, sequela 06/10/2017   Dyspnea on exertion 05/29/2017   Tachycardia 05/29/2017   Hyperkalemia 54/03/8118   Acute metabolic encephalopathy    Anemia 04/23/2017   Ascites 04/23/2017   COPD (chronic obstructive pulmonary disease) ([place]) 04/23/2017   Acute on chronic respiratory failure with hypoxia ([place]) 03/25/2017   Arrhythmia 03/25/2017   COPD GOLD 0 with flattening on inps f/v  09/27/2016   Essential hypertension 09/27/2016   Fluid overload 08/30/2016   COPD exacerbation ([place]) 08/17/2016   Hypertensive urgency 08/17/2016   Respiratory failure ([place]) 08/17/2016   Problem with dialysis access ([place]) 07/23/2016   Chronic hepatitis B ([place]) 03/05/2014   Chronic hepatitis C without hepatic coma ([place]) 03/05/2014   Internal hemorrhoids with bleeding, swelling and itching 03/05/2014   Thrombocytopenia ([place]) 03/05/2014   Chest pain 02/27/2014   Alcohol abuse 04/14/2009   Cigarette smoker 04/14/2009   GANGLION CYST 04/14/2009    Past Surgical History:  Procedure Laterality Date   A/V FISTULAGRAM Left 05/26/2017   Procedure: A/V FISTULAGRAM;  Surgeon: Conrad Susquehanna Depot, MD;  Location: [place] [facility];  Service: Cardiovascular;  Laterality: Left;   A/V FISTULAGRAM Right 11/18/2017   Procedure: A/V FISTULAGRAM - Right Arm;  Surgeon: Elam Dutch, MD;  Location: [place] [facility];  Service: Cardiovascular;  Laterality: Right;   APPLICATION OF WOUND VAC Left 06/14/2017   Procedure: APPLICATION OF WOUND VAC;  Surgeon: Katha Cabal, MD;  Location: [facility];  Service: Vascular;   Laterality: Left;   AV FISTULA PLACEMENT  2012   BELIEVED WAS PLACED IN JUNE   AV FISTULA PLACEMENT Right 08/09/2017   Procedure: Creation Right arm ARTERIOVENOUS BRACHIOCEPOHALIC FISTULA;  Surgeon: Elam Dutch, MD;  Location: [facility];  Service: Vascular;  Laterality: Right;   AV FISTULA PLACEMENT Right 11/22/2017   Procedure: INSERTION OF ARTERIOVENOUS (AV) GORE-TEX GRAFT RIGHT UPPER ARM;  Surgeon: Elam Dutch, MD;  Location: [place];  Service: Vascular;  Laterality: Right;   BIOPSY  01/25/2018   Procedure: BIOPSY;  Surgeon: Jerene Bears, MD;  Location: [place];  Service: Gastroenterology;;   BIOPSY  04/10/2019   Procedure: BIOPSY;  Surgeon: Jerene Bears, MD;  Location: [facility];  Service: Gastroenterology;;   COLONOSCOPY     COLONOSCOPY WITH PROPOFOL N/A 01/25/2018   Procedure: COLONOSCOPY WITH PROPOFOL;  Surgeon: Jerene Bears, MD;  Location: [place];  Service: Gastroenterology;  Laterality: N/A;  CORONARY STENT INTERVENTION N/A 02/22/2017   Procedure: CORONARY STENT INTERVENTION;  Surgeon: Nigel Mormon, MD;  Location: [place] [facility];  Service: Cardiovascular;  Laterality: N/A;   ESOPHAGOGASTRODUODENOSCOPY (EGD) WITH PROPOFOL N/A 01/25/2018   Procedure: ESOPHAGOGASTRODUODENOSCOPY (EGD) WITH PROPOFOL;  Surgeon: Jerene Bears, MD;  Location: [place];  Service: Gastroenterology;  Laterality: N/A;   ESOPHAGOGASTRODUODENOSCOPY (EGD) WITH PROPOFOL N/A 04/10/2019   Procedure: ESOPHAGOGASTRODUODENOSCOPY (EGD) WITH PROPOFOL;  Surgeon: Jerene Bears, MD;  [facility];  Service: Gastroenterology;  Laterality: N/A;   FLEXIBLE SIGMOIDOSCOPY N/A 07/15/2017   Procedure: FLEXIBLE SIGMOIDOSCOPY;  Surgeon: Carol Ada, MD;  Location: [place];  Service: Endoscopy;  Laterality: N/A;   HEMORRHOID BANDING     I&D EXTREMITY Left 06/01/2017   Procedure: IRRIGATION AND DEBRIDEMENT LEFT ARM HEMATOMA WITH LIGATION OF LEFT ARM AV FISTULA;  Surgeon:  Elam Dutch, MD;  Location: [place];  Service: Vascular;  Laterality: Left;   I&D EXTREMITY Left 06/14/2017   Procedure: IRRIGATION AND DEBRIDEMENT EXTREMITY;  Surgeon: Katha Cabal, MD;  Location: [facility];  Service: Vascular;  Laterality: Left;   INSERTION OF DIALYSIS CATHETER  05/30/2017   INSERTION OF DIALYSIS CATHETER N/A 05/30/2017   Procedure: INSERTION OF DIALYSIS CATHETER;  Surgeon: Elam Dutch, MD;  Location: [facility];  Service: Vascular;  Laterality: N/A;   IR PARACENTESIS  08/30/2017   IR PARACENTESIS  09/29/2017   IR PARACENTESIS  10/28/2017   IR PARACENTESIS  11/09/2017   IR PARACENTESIS  11/16/2017   IR PARACENTESIS  11/28/2017   IR PARACENTESIS  12/01/2017   IR PARACENTESIS  12/06/2017   IR PARACENTESIS  01/03/2018   IR PARACENTESIS  01/23/2018   IR PARACENTESIS  02/07/2018   IR PARACENTESIS  02/21/2018   IR PARACENTESIS  03/06/2018   IR PARACENTESIS  03/17/2018   IR PARACENTESIS  04/04/2018   IR PARACENTESIS  12/28/2018   IR PARACENTESIS  01/08/2019   IR PARACENTESIS  01/23/2019   IR PARACENTESIS  02/01/2019   IR PARACENTESIS  02/19/2019   IR PARACENTESIS  03/01/2019   IR PARACENTESIS  03/15/2019   IR PARACENTESIS  04/03/2019   IR PARACENTESIS  04/12/2019   IR PARACENTESIS  05/01/2019   IR RADIOLOGIST EVAL & MGMT  02/14/2018   IR RADIOLOGIST EVAL & MGMT  02/22/2019   LEFT HEART CATH AND CORONARY ANGIOGRAPHY N/A 02/22/2017   Procedure: LEFT HEART CATH AND CORONARY ANGIOGRAPHY;  Surgeon: Nigel Mormon, MD;  Location: [place] [facility];  Service: Cardiovascular;  Laterality: N/A;   LIGATION OF ARTERIOVENOUS  FISTULA Left 09/15/1060   Procedure: Plication of Left Arm Arteriovenous Fistula;  Surgeon: Elam Dutch, MD;  Location: [facility] OR;  Service: Vascular;  Laterality: Left;   POLYPECTOMY     POLYPECTOMY  01/25/2018   Procedure: POLYPECTOMY;  Surgeon: Jerene Bears, MD;  Location: [place];  Service: Gastroenterology;;   REVISON OF  ARTERIOVENOUS FISTULA Left 6/94/8546   Procedure: PLICATION OF DISTAL ANEURYSMAL SEGEMENT OF LEFT UPPER ARM ARTERIOVENOUS FISTULA;  Surgeon: Elam Dutch, MD;  Location: [place];  Service: Vascular;  Laterality: Left;   REVISON OF ARTERIOVENOUS FISTULA Left 2/70/3500   Procedure: Plication of Left Upper Arm Fistula ;  Surgeon: Waynetta Sandy, MD;  Location: [place];  Service: Vascular;  Laterality: Left;   SKIN GRAFT SPLIT THICKNESS LEG / FOOT Left    SKIN GRAFT SPLIT THICKNESS LEFT ARM DONOR SITE: LEFT ANTERIOR THIGH   SKIN SPLIT GRAFT Left 07/04/2017  Procedure: SKIN GRAFT SPLIT THICKNESS LEFT ARM DONOR SITE: LEFT ANTERIOR THIGH;  Surgeon: Elam Dutch, MD;  Location: [place];  Service: Vascular;  Laterality: Left;   THROMBECTOMY W/ EMBOLECTOMY Left 06/05/2017   Procedure: EXPLORATION OF LEFT ARM FOR BLEEDING; OVERSEWED PROXIMAL FISTULA;  Surgeon: Angelia Mould, MD;  Location: [place];  Service: Vascular;  Laterality: Left;   WOUND EXPLORATION Left 06/03/2017   Procedure: WOUND EXPLORATION WITH WOUND VAC APPLICATION TO LEFT ARM;  Surgeon: Angelia Mould, MD;  Location: [place];  Service: Vascular;  Laterality: Left;        Home Medications    Prior to Admission medications   Medication Sig Start Date End Date Taking? Authorizing Provider  albuterol (VENTOLIN HFA) 108 (90 Base) MCG/ACT inhaler Inhale 2 puffs into the lungs every 6 (six) hours as needed for wheezing or shortness of breath.    [provider]  hydrALAZINE (APRESOLINE) 100 MG tablet Take 100 mg by mouth 3 (three) times daily.  02/13/19   [provider]  hydrOXYzine (ATARAX/VISTARIL) 25 MG tablet Take 25 mg by mouth 3 (three) times daily as needed for itching.  02/14/19   [provider]  lactulose (CHRONULAC) 10 GM/15ML solution TAKE 45 MLS BY MOUTH 2 TIMES DAILY. 04/16/19   Pyrtle, Lajuan Lines, MD  metoprolol tartrate (LOPRESSOR) 25 MG tablet Take 1 tablet (25 mg total) by  mouth 2 (two) times daily. 03/11/18   Geradine Girt, DO  montelukast (SINGULAIR) 10 MG tablet Take 10 mg by mouth every evening.     [provider]  ondansetron (ZOFRAN ODT) 4 MG disintegrating tablet Take 1 tablet (4 mg total) by mouth every 8 (eight) hours as needed for nausea or vomiting. 05/07/19   Fredia Sorrow, MD  ondansetron (ZOFRAN) 8 MG tablet Take 8 mg by mouth every 8 (eight) hours as needed for nausea or vomiting.     [provider]  pantoprazole (PROTONIX) 40 MG tablet Take 1 tablet (40 mg total) by mouth daily. Patient taking differently: Take 40 mg by mouth every evening.  03/22/19   Zehr, Laban Emperor, PA-C  sevelamer carbonate (RENVELA) 800 MG tablet Take 4 tablets with meals  And 2 tablets twice daily with snacks Patient taking differently: Take 1,600 mg by mouth See admin instructions. Take 2 tablets (1600mg ) with meals & with snacks 01/22/18   Patrecia Pour, MD  sulfamethoxazole-trimethoprim (BACTRIM DS) 800-160 MG tablet Take 1 tablet by mouth daily. Patient taking differently: Take 1 tablet by mouth 2 (two) times daily.  12/30/18   Geradine Girt, DO  SYMBICORT 160-4.5 MCG/ACT inhaler Inhale 2 puffs into the lungs 2 (two) times daily.  11/30/18   [provider]  zolpidem (AMBIEN) 10 MG tablet Take 10 mg by mouth at bedtime as needed for sleep.    [provider]    Family History Family History  Problem Relation Age of Onset   Heart disease Mother    Lung cancer Mother    Heart disease Father    Malignant hyperthermia Father    COPD Father    Throat cancer Sister    Esophageal cancer Sister    Hypertension Other    COPD Other    Colon cancer Neg Hx    Colon polyps Neg Hx    Rectal cancer Neg Hx    Stomach cancer Neg Hx     Social History Social History   Tobacco Use   Smoking status: Current Every  Day Smoker    Packs/day: 0.50    Years: 43.00    Pack years: 21.50    Types: Cigarettes    Start date:  08/13/1973   Smokeless tobacco: Never Used   Tobacco comment: i dont know i just make   Substance Use Topics   Alcohol use: Not Currently    Frequency: Never    Comment: quit drinking in 2017   Drug use: Yes    Types: Marijuana, Cocaine    Comment: reports using once every 3 months, 04-06-2019 was this      Allergies   Morphine and related, Aspirin, Clonidine derivatives, Tramadol, and Tylenol [acetaminophen]   Review of Systems Review of Systems  Constitutional: Negative for chills and fever.  HENT: Negative for congestion, rhinorrhea and sore throat.   Eyes: Negative for visual disturbance.  Respiratory: Negative for cough and shortness of breath.   Cardiovascular: Negative for chest pain and leg swelling.  Gastrointestinal: Positive for abdominal pain, diarrhea and vomiting. Negative for nausea.  Musculoskeletal: Negative for back pain and neck pain.  Skin: Negative for rash.  Neurological: Negative for dizziness, light-headedness and headaches.  Hematological: Does not bruise/bleed easily.  Psychiatric/Behavioral: Negative for confusion.     Physical Exam Updated Vital Signs BP 115/85    Pulse 78    Temp 97.9 F (36.6 C) (Oral)    Resp 18    SpO2 98%   Physical Exam Vitals signs and nursing note reviewed.  Constitutional:      Appearance: Normal appearance. He is well-developed.  HENT:     Head: Normocephalic and atraumatic.  Eyes:     Extraocular Movements: Extraocular movements intact.     Conjunctiva/sclera: Conjunctivae normal.     Pupils: Pupils are equal, round, and reactive to light.  Neck:     Musculoskeletal: Normal range of motion and neck supple.  Cardiovascular:     Rate and Rhythm: Normal rate and regular rhythm.     Heart sounds: No murmur.  Pulmonary:     Effort: Pulmonary effort is normal. No respiratory distress.     Breath sounds: Normal breath sounds.  Abdominal:     General: Bowel sounds are normal. There is distension.      Palpations: Abdomen is soft.     Tenderness: There is no abdominal tenderness. There is no guarding.     Comments: Abdomen distended but not firm.  No tenderness no guarding.  Musculoskeletal: Normal range of motion.     Comments: Patient with AV fistula his right arm and left arm.  Currently the right arm is the one in use.  Has a good thrill.  Skin:    General: Skin is warm and dry.  Neurological:     General: No focal deficit present.     Mental Status: He is alert and oriented to person, place, and time.      ED Treatments / Results  Labs (all labs ordered are listed, but only abnormal results are displayed) Labs Reviewed  COMPREHENSIVE METABOLIC PANEL - Abnormal; Notable for the following components:      Result Value   Sodium 132 (*)    Potassium 5.7 (*)    Chloride 90 (*)    Glucose, Bld 114 (*)    BUN 37 (*)    Creatinine, Ser 9.87 (*)    GFR calc non Af Amer 5 (*)    GFR calc Af Amer 6 (*)    All other components within normal limits  CBC  WITH DIFFERENTIAL/PLATELET - Abnormal; Notable for the following components:   RBC 4.13 (*)    Hemoglobin 11.7 (*)    HCT 33.6 (*)    All other components within normal limits  LIPASE, BLOOD    EKG None  Radiology Ct Chest W Contrast  Result Date: 05/07/2019 CLINICAL DATA:  Abdominal pain with nausea and diarrhea, recurrent ascites, dialysis patient EXAM: CT CHEST, ABDOMEN, AND PELVIS WITH CONTRAST TECHNIQUE: Multidetector CT imaging of the chest, abdomen and pelvis was performed following the standard protocol during bolus administration of intravenous contrast. CONTRAST:  170mL OMNIPAQUE IOHEXOL 300 MG/ML  SOLN COMPARISON:  03/08/2019, 05/08/2017 FINDINGS: CT CHEST FINDINGS Cardiovascular: Extensive calcific atherosclerosis of the major branch vessels and aorta. No acute dissection, aneurysm, or mediastinal hematoma. Central pulmonary arteries are patent. No large central pulmonary embolus. Native coronary atherosclerosis.  Heart is enlarged. Small pericardial effusion noted. Contrast refluxes into the hepatic IVC and hepatic veins compatible with elevated right heart pressures or right heart failure. Mediastinum/Nodes: Thyroid, trachea and esophagus unremarkable. Mildly prominent left supraclavicular adenopathy, short axis measurements 12 mm, image 6 series 3. No significant axillary adenopathy. Right paratracheal mild adenopathy measures 1.3 cm in short axis, image 24 series 3. Prominent subcarinal lymph nodes with short axis measurements 1.4 cm. No hilar adenopathy appreciated. Lungs/Pleura: Minor apical scarring and small subpleural blebs noted. Mild upper lobe paraseptal emphysema peripherally. Minor dependent basilar atelectasis. No focal pneumonia, collapse or consolidation. Negative for edema or interstitial process. No pleural abnormality, effusion, or pneumothorax. Musculoskeletal: Chronic osseous changes of renal osteodystrophy. Degenerative changes of the spine. Intact thoracic spine. No compression fracture. Sternum intact. CT ABDOMEN PELVIS FINDINGS Hepatobiliary: Cirrhosis of the liver noted with micro nodularity of the surface. No large focal hepatic abnormality or intrahepatic biliary dilatation. Gallbladder biliary system unremarkable. Common bile duct nondilated. Pancreas: Unremarkable. No pancreatic ductal dilatation or surrounding inflammatory changes. Spleen: Normal in size without focal abnormality. Adrenals/Urinary Tract: Normal adrenal glands. Chronic renal atrophy and subcentimeter chronic cystic disease related to long-term dialysis. Extensive renal atherosclerosis. No renal obstruction or hydronephrosis. No hydroureter. Bladder collapsed. Stomach/Bowel: Negative for bowel obstruction, significant dilatation, ileus, free air. No definite focal bowel thickening. Large volume of abdominopelvic ascites. Vascular/Lymphatic: Extensive abdominal atherosclerosis with heavy calcification of the abdominopelvic  vasculature and aorta. No occlusive process. No bulky adenopathy. Reproductive: No significant finding by CT. Other: No inguinal or abdominal wall hernia. Musculoskeletal: Chronic changes of renal osteodystrophy. Degenerative changes of the lumbar spine. No acute osseous finding. IMPRESSION: No acute intrathoracic finding. Cardiomegaly with reflux of contrast into the hepatic IVC and hepatic veins suggesting elevated right heart pressures or right heart failure. 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  Shick M.D.   On: 05/07/2019 14:01   Ct Abdomen Pelvis W Contrast  Result Date: 05/07/2019 CLINICAL DATA:  Abdominal pain with nausea and diarrhea, recurrent ascites, dialysis patient EXAM: CT CHEST, ABDOMEN, AND PELVIS WITH CONTRAST TECHNIQUE: Multidetector CT imaging of the chest, abdomen and pelvis was performed following the standard protocol during bolus administration of intravenous contrast. CONTRAST:  157mL OMNIPAQUE IOHEXOL 300 MG/ML  SOLN COMPARISON:  03/08/2019, 05/08/2017 FINDINGS: CT CHEST FINDINGS Cardiovascular: Extensive calcific atherosclerosis of the major branch vessels and aorta. No acute dissection, aneurysm, or  mediastinal hematoma. Central pulmonary arteries are patent. No large central pulmonary embolus. Native coronary atherosclerosis. Heart is enlarged. Small pericardial effusion noted. Contrast refluxes into the hepatic IVC and hepatic veins compatible with elevated right heart pressures or right heart failure. Mediastinum/Nodes: Thyroid, trachea and esophagus unremarkable. Mildly prominent left supraclavicular adenopathy, short axis  measurements 12 mm, image 6 series 3. No significant axillary adenopathy. Right paratracheal mild adenopathy measures 1.3 cm in short axis, image 24 series 3. Prominent subcarinal lymph nodes with short axis measurements 1.4 cm. No hilar adenopathy appreciated. Lungs/Pleura: Minor apical scarring and small subpleural blebs noted. Mild upper lobe paraseptal emphysema peripherally. Minor dependent basilar atelectasis. No focal pneumonia, collapse or consolidation. Negative for edema or interstitial process. No pleural abnormality, effusion, or pneumothorax. Musculoskeletal: Chronic osseous changes of renal osteodystrophy. Degenerative changes of the spine. Intact thoracic spine. No compression fracture. Sternum intact. CT ABDOMEN PELVIS FINDINGS Hepatobiliary: Cirrhosis of the liver noted with micro nodularity of the surface. No large focal hepatic abnormality or intrahepatic biliary dilatation. Gallbladder biliary system unremarkable. Common bile duct nondilated. Pancreas: Unremarkable. No pancreatic ductal dilatation or surrounding inflammatory changes. Spleen: Normal in size without focal abnormality. Adrenals/Urinary Tract: Normal adrenal glands. Chronic renal atrophy and subcentimeter chronic cystic disease related to long-term dialysis. Extensive renal atherosclerosis. No renal obstruction or hydronephrosis. No hydroureter. Bladder collapsed. Stomach/Bowel: Negative for bowel obstruction, significant dilatation, ileus, free air. No definite focal bowel thickening. Large volume of abdominopelvic ascites. Vascular/Lymphatic: Extensive abdominal atherosclerosis with heavy calcification of the abdominopelvic vasculature and aorta. No occlusive process. No bulky adenopathy. Reproductive: No significant finding by CT. Other: No inguinal or abdominal wall hernia. Musculoskeletal: Chronic changes of renal osteodystrophy. Degenerative changes of the lumbar spine. No acute osseous finding. IMPRESSION: No acute  intrathoracic finding. Cardiomegaly with reflux of contrast into the hepatic IVC and hepatic veins suggesting elevated right heart pressures or right heart failure. 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  Shick M.D.   On: 05/07/2019 14:01   Dg Chest Port 1 View  Result Date: 05/07/2019 CLINICAL DATA:  Chronic renal failure.  Nausea and diarrhea EXAM: PORTABLE CHEST 1 VIEW COMPARISON:  April 16, 2019. FINDINGS: There is slight atelectasis in the left base. The lungs elsewhere are clear. There is generalized cardiac enlargement with pulmonary vascularity normal. No adenopathy. There is aortic atherosclerosis. There is extensive carotid artery calcification bilaterally. There is also extensive right axillary artery and right subclavian artery calcification. No evident bone lesions. IMPRESSION: Slight left base atelectasis. Cardiac enlargement. There may be pericardial effusion superimposed on cardiomegaly given the configuration of the heart. Extensive multifocal arterial vascular calcification. No adenopathy. Aortic Atherosclerosis (ICD10-I70.0). Electronically Signed   By: Lowella Grip III M.D.   On: 05/07/2019 10:10    Procedures Procedures (including critical care time)  Medications Ordered in ED Medications  ondansetron (ZOFRAN) injection 4 mg (4 mg Intravenous Given 05/07/19 0950)  HYDROmorphone (DILAUDID) injection 1 mg (1 mg Intravenous Given 05/07/19 0950)  diphenhydrAMINE (BENADRYL) injection 25 mg (25 mg Intravenous Given 05/07/19 1112)  iohexol (OMNIPAQUE) 300 MG/ML solution 100 mL (100 mLs Intravenous Contrast Given 05/07/19 1310)       Initial Impression / Assessment and Plan / ED Course  I have reviewed the triage  vital signs and the nursing notes.  Pertinent labs & imaging results that were available during my care of the patient were reviewed by me and considered in my medical decision making (see chart for details).       Patient with visits in September and October for very similar findings.  Patient often has a complaint of generalized abdominal pain.  And like today there is no abdominal tenderness.  Do not think there is any evidence of spontaneous bacterial peritonitis.  No leukocytosis.  Patient missed dialysis today because of that but he can reschedule that for tomorrow.  Chest x-ray and CT chest was done to rule out pericardial effusion or any significant effusion on the lungs there was no evidence of that.  Patient without any diarrhea here.  Only dry heaves not vomiting any fluids.  Think patient stable enough to discharge home.  He is scheduled again for paracentesis next week is followed by [facility].  Cording patient's note patient is a DNR.  Patient stable for discharge home.  Will be given prescription for Zofran.  He was given 1 dose of hydromorphone here which will stay around until he is dialyzed.  Patient is more comfortable.  Final Clinical Impressions(s) / ED Diagnoses   Final diagnoses:  Generalized abdominal pain  Diarrhea, unspecified type  End-stage renal disease on hemodialysis ([place])  Other ascites    ED Discharge Orders         Ordered    ondansetron (ZOFRAN ODT) 4 MG disintegrating tablet  Every 8 hours PRN     05/07/19 1549            Fredia Sorrow, MD 05/07/19 1717

## 2019-05-08 ENCOUNTER — Encounter (HOSPITAL_COMMUNITY): Payer: Self-pay | Admitting: Physician Assistant

## 2019-05-08 ENCOUNTER — Telehealth: Payer: Self-pay | Admitting: Internal Medicine

## 2019-05-08 ENCOUNTER — Other Ambulatory Visit: Payer: Self-pay

## 2019-05-08 ENCOUNTER — Ambulatory Visit (HOSPITAL_COMMUNITY)
Admission: RE | Admit: 2019-05-08 | Discharge: 2019-05-08 | Disposition: A | Discharge status: Home / Self Care | Payer: Medicare Other | Source: Ambulatory Visit | Attending: Internal Medicine | Admitting: Internal Medicine

## 2019-05-08 DIAGNOSIS — K7031 Alcoholic cirrhosis of liver with ascites: Secondary | ICD-10-CM | POA: Diagnosis not present

## 2019-05-08 DIAGNOSIS — R188 Other ascites: Secondary | ICD-10-CM

## 2019-05-08 DIAGNOSIS — K7460 Unspecified cirrhosis of liver: Secondary | ICD-10-CM | POA: Diagnosis not present

## 2019-05-08 HISTORY — PX: IR PARACENTESIS: IMG2679

## 2019-05-08 LAB — GRAM STAIN

## 2019-05-08 LAB — BODY FLUID CELL COUNT WITH DIFFERENTIAL
Eos, Fluid: 0 %
Lymphs, Fluid: 30 %
Monocyte-Macrophage-Serous Fluid: 66 % (ref 50–90)
Neutrophil Count, Fluid: 4 % (ref 0–25)
Total Nucleated Cell Count, Fluid: 260 cu mm (ref 0–1000)

## 2019-05-08 MED ORDER — LIDOCAINE HCL 1 % IJ SOLN
INTRAMUSCULAR | Status: AC
  Filled 2019-05-08: qty 20

## 2019-05-08 NOTE — Telephone Encounter (Signed)
By CT he has large volume ascites Would schedule for paracentesis up to 6 L; should not need albumin given that he is on dialysis Please send for cell count, culture

## 2019-05-08 NOTE — Telephone Encounter (Signed)
Pt called stating that he is experiencing severe abd pain, he went to the ED yesterday but they did not help him much he said. Pls call him.

## 2019-05-08 NOTE — Procedures (Signed)
PROCEDURE SUMMARY:  Successful US guided paracentesis from left lateral abdomen.  Yielded 2 liters of clear yellow fluid.  No immediate complications.  Patient tolerated well.  EBL = trace  Specimen was sent for labs.  Shanica Castellanos S Jelisa  PA-C 05/08/2019 3:14 PM

## 2019-05-08 NOTE — Telephone Encounter (Signed)
Pt seen in the ER yesterday for abd pain, states they did not help him. Calling stating his abdomen hurts so bad around his navel. Reports the pain is constant and it hurts when he walks. States it has been hurting but it is definitely worse, denies fever. Pt was given some pain med IV and some zofran in the ER. Please advise.

## 2019-05-08 NOTE — Telephone Encounter (Signed)
Pt scheduled for IR para as ordered today at [facility] at 2pm, pt to arrive there at 1:45pm.

## 2019-05-09 DIAGNOSIS — D509 Iron deficiency anemia, unspecified: Secondary | ICD-10-CM | POA: Diagnosis not present

## 2019-05-09 DIAGNOSIS — N186 End stage renal disease: Secondary | ICD-10-CM | POA: Diagnosis not present

## 2019-05-09 DIAGNOSIS — N2581 Secondary hyperparathyroidism of renal origin: Secondary | ICD-10-CM | POA: Diagnosis not present

## 2019-05-09 DIAGNOSIS — D631 Anemia in chronic kidney disease: Secondary | ICD-10-CM | POA: Diagnosis not present

## 2019-05-09 DIAGNOSIS — Z992 Dependence on renal dialysis: Secondary | ICD-10-CM | POA: Diagnosis not present

## 2019-05-10 LAB — PATHOLOGIST SMEAR REVIEW

## 2019-05-10 IMAGING — DX DG CHEST 1V PORT
1 series · 1 of 1 positions shown · non-contrast
Comparison: 11/04/2017

CLINICAL DATA: Shortness of breath.  Smoker

EXAM:
PORTABLE CHEST 1 VIEW

[chest ap]
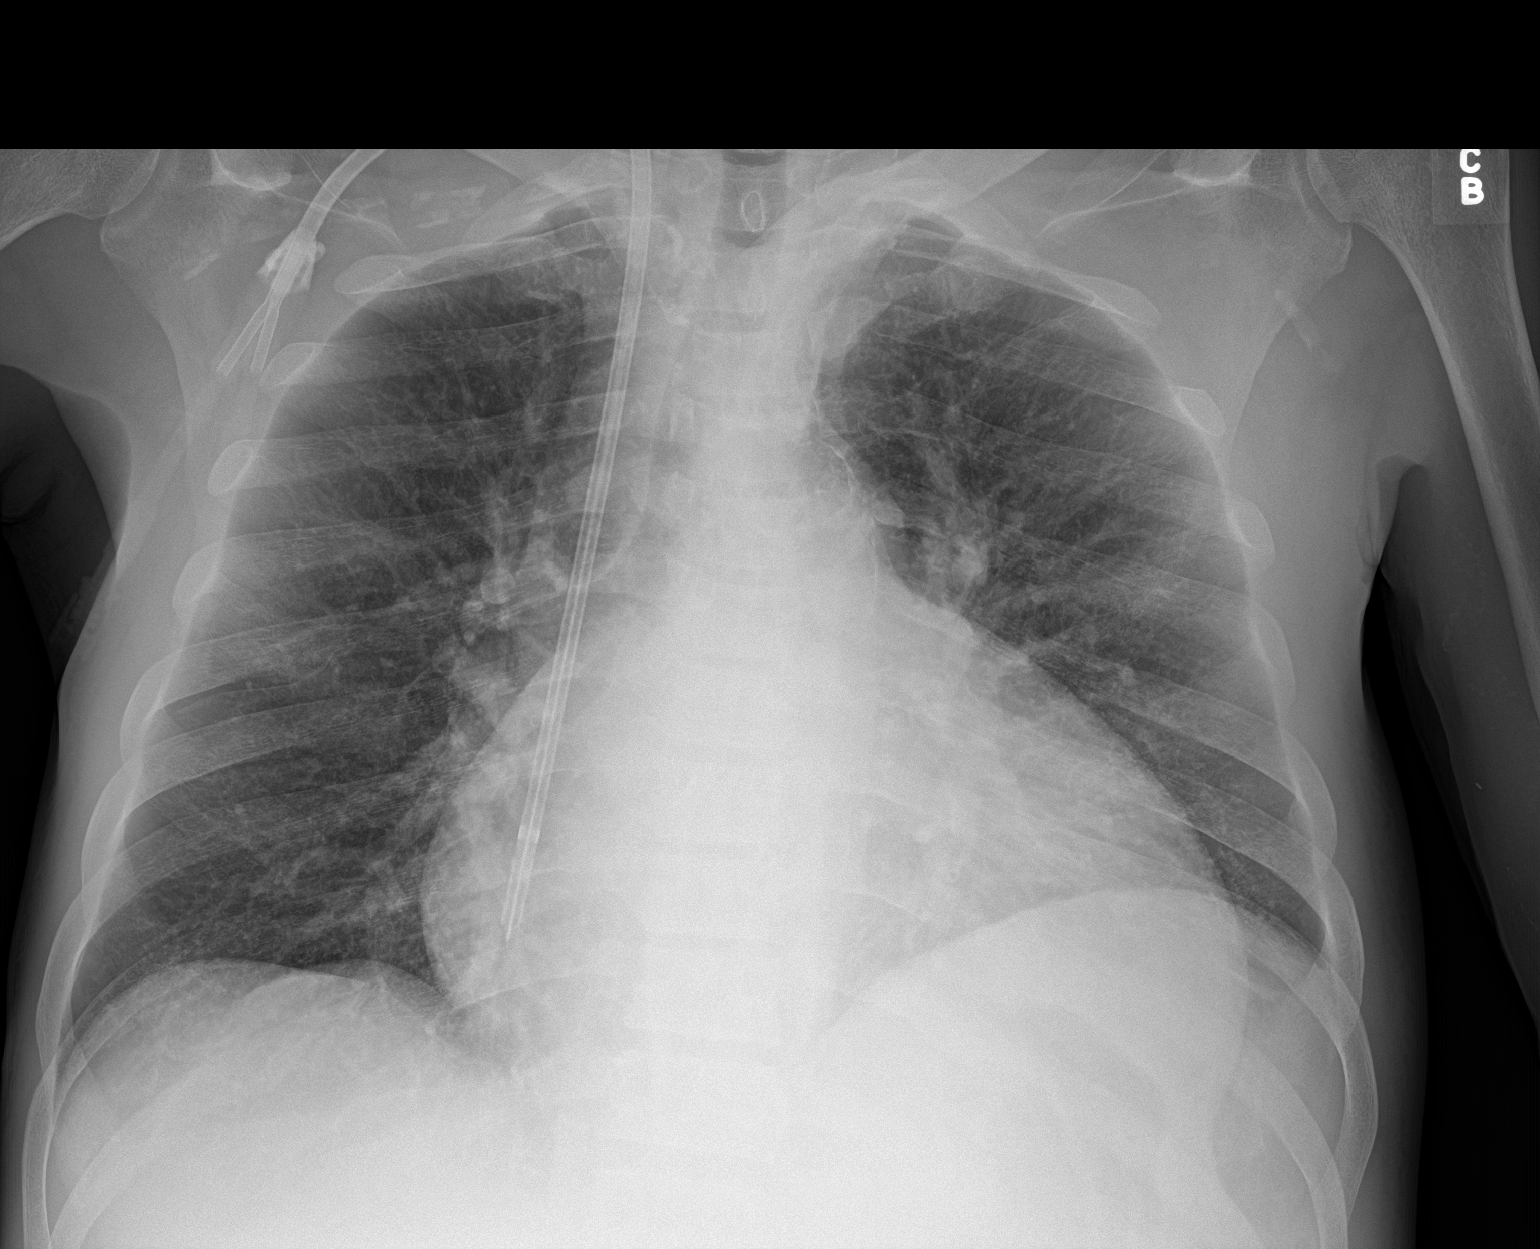

[1 of 1 positions shown; findings below may reference images not displayed]

FINDINGS: Decreased inspiration. Stable enlarged cardiac silhouette. The right
jugular double-lumen catheter tips remain in the right atrium,
currently in the mid to lower right atrium. Stable prominence of the
interstitial markings and mild prominence of the pulmonary
vasculature. Minimal left basilar linear atelectasis and possible
small left pleural effusion. Diffuse osteopenia. Atheromatous
arterial calcifications.
IMPRESSION: 1. Decreased inspiration with stable mild elevation of the left
hemidiaphragm and interval minimal left basilar linear atelectasis
or scarring and possible small left pleural effusion.
2. Stable cardiomegaly, mild pulmonary vascular congestion and mild
chronic interstitial lung disease.

## 2019-05-11 ENCOUNTER — Other Ambulatory Visit: Payer: Self-pay | Admitting: Internal Medicine

## 2019-05-11 DIAGNOSIS — N2581 Secondary hyperparathyroidism of renal origin: Secondary | ICD-10-CM | POA: Diagnosis not present

## 2019-05-11 DIAGNOSIS — D509 Iron deficiency anemia, unspecified: Secondary | ICD-10-CM | POA: Diagnosis not present

## 2019-05-11 DIAGNOSIS — D631 Anemia in chronic kidney disease: Secondary | ICD-10-CM | POA: Diagnosis not present

## 2019-05-11 DIAGNOSIS — N186 End stage renal disease: Secondary | ICD-10-CM | POA: Diagnosis not present

## 2019-05-11 DIAGNOSIS — Z992 Dependence on renal dialysis: Secondary | ICD-10-CM | POA: Diagnosis not present

## 2019-05-11 IMAGING — US IR PARACENTESIS
2 series · 3 of 3 positions shown · non-contrast
Comparison: Ultrasound-guided paracentesis - 10/28/2017 CT abdomen
and pelvis - 10/25/2017

MEDICATIONS:
None.

COMPLICATIONS:
None immediate.

INDICATION: Recurrent symptomatic ascites. Please perform ultrasound-guided
paracentesis for diagnostic and therapeutic purposes.

EXAM:
ULTRASOUND-GUIDED PARACENTESIS
TECHNIQUE: Informed written consent was obtained from the patient after a
discussion of the risks, benefits and alternatives to treatment. A
timeout was performed prior to the initiation of the procedure.

[Series 1: ir (id) (id)/(id)/(id) ir · 1 of 1 slices shown (1 of 2)]
[im 1/1]
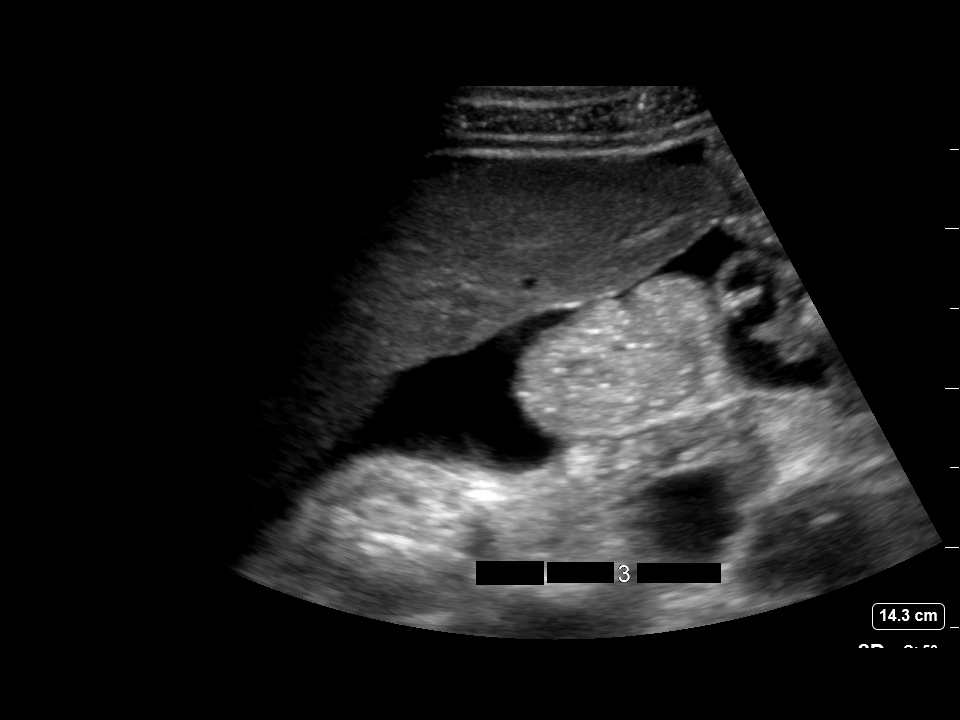

[Series 1: ir (id) (id)/(id)/(id) ir · 2 of 2 slices shown (2 of 2)]
[im 1/2]
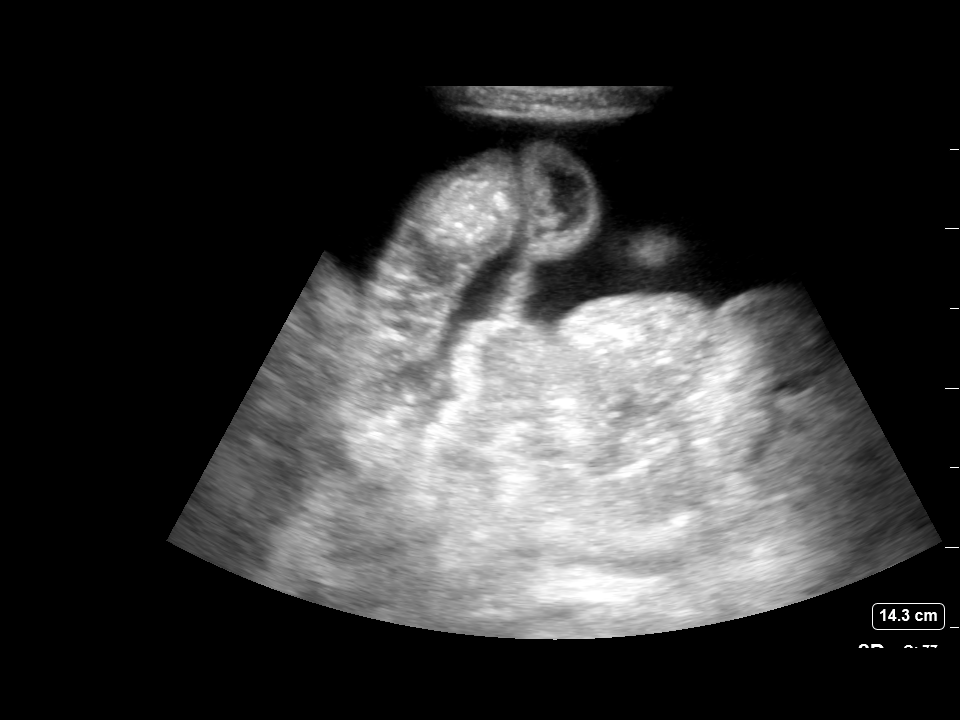
[im 2/2]
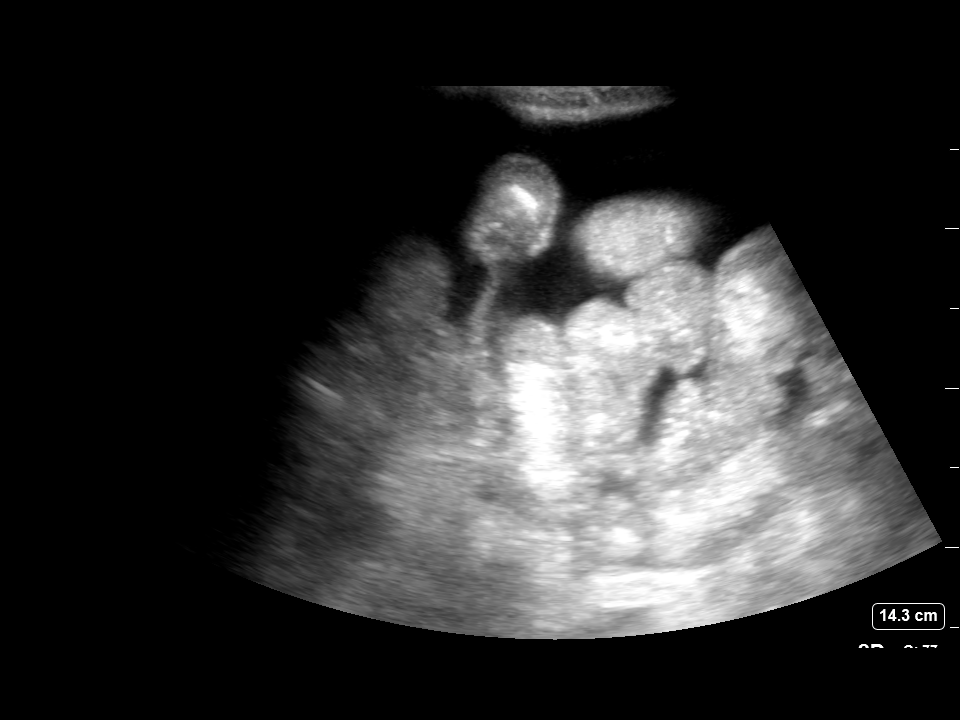

[3 of 3 positions shown; findings below may reference images not displayed]

Initial ultrasound scanning demonstrates a small to moderate amount
of ascites within the right lower abdominal quadrant. The right
lower abdomen was prepped and draped in the usual sterile fashion.
1% lidocaine with epinephrine was used for local anesthesia. Under
direct ultrasound guidance, a 19 gauge, 7-cm, Yueh catheter was
introduced. An ultrasound image was saved for documentation
purposed. The paracentesis was performed. The catheter was removed
and a dressing was applied. The patient tolerated the procedure well
without immediate post procedural complication.
FINDINGS: A total of approximately 3 liters of serous fluid was removed.
Samples were sent to the laboratory as requested by the clinical
team.
IMPRESSION: Successful ultrasound-guided paracentesis yielding 3 liters of
peritoneal fluid.

## 2019-05-11 IMAGING — US IR ABDOMEN US LIMITED
1 series · 3 of 3 positions shown · non-contrast
Comparison: Ascites search ultrasound-11/04/2017; ultrasound-guided
paracentesis-10/28/2017; CT abdomen and pelvis - 10/25/2017

CLINICAL DATA: History of cirrhosis with recurrent symptomatic
ascites. Please perform ultrasound-guided paracentesis for
therapeutic purposes.

EXAM:
LIMITED ABDOMEN ULTRASOUND FOR ASCITES
TECHNIQUE: Limited ultrasound survey for ascites was performed in all four
abdominal quadrants.

[Series 1: ir (id) (id)/(id)/(id) ir · 3 of 3 slices shown]
[im 1/3]
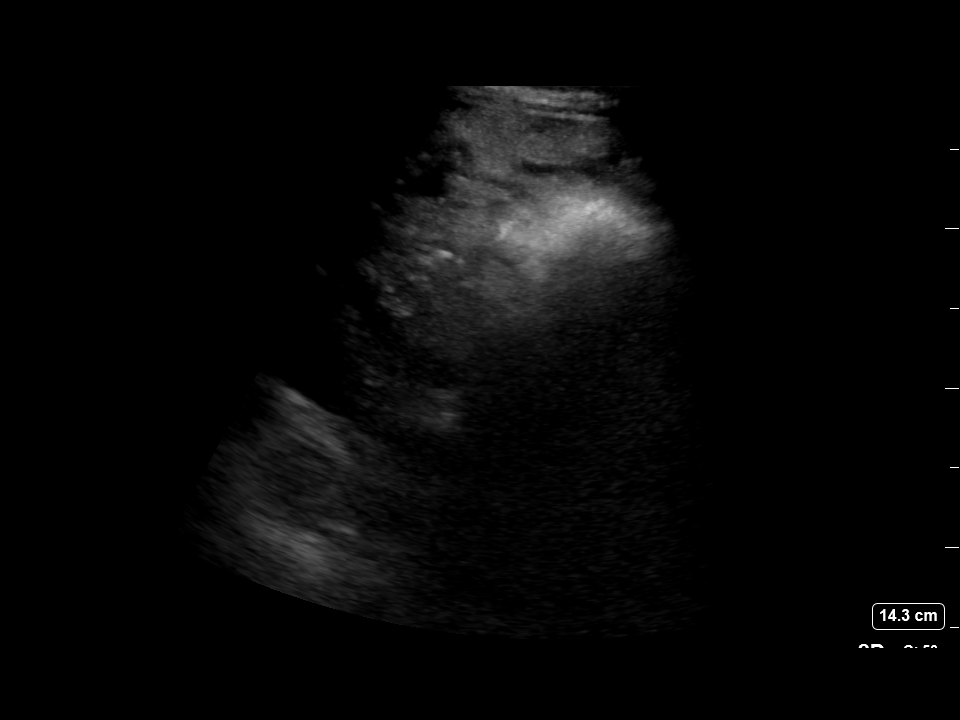
[im 2/3]
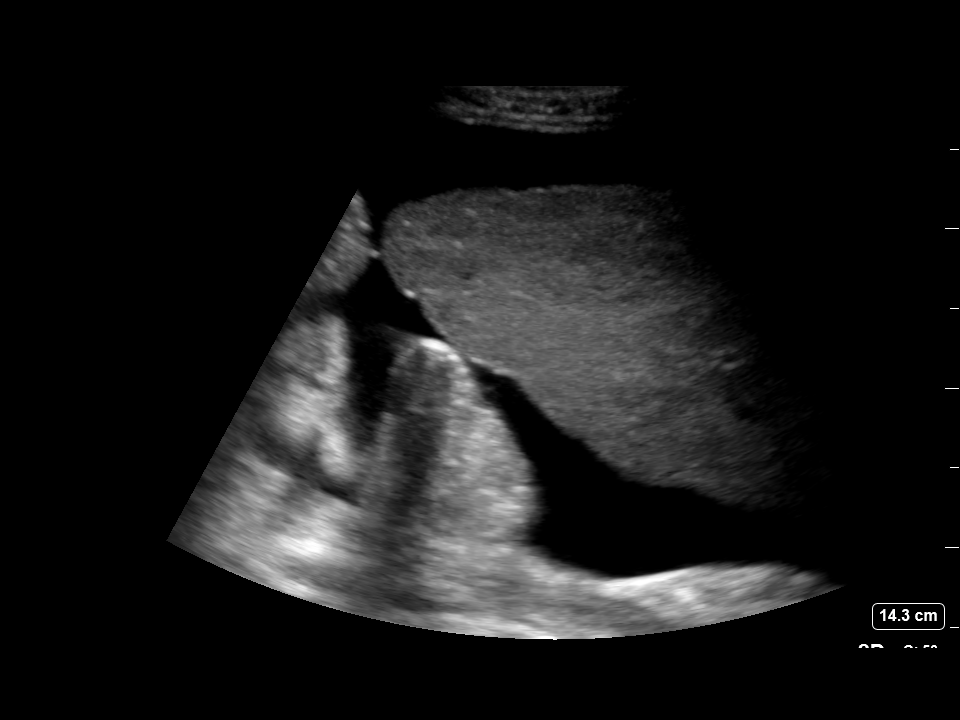
[im 3/3]
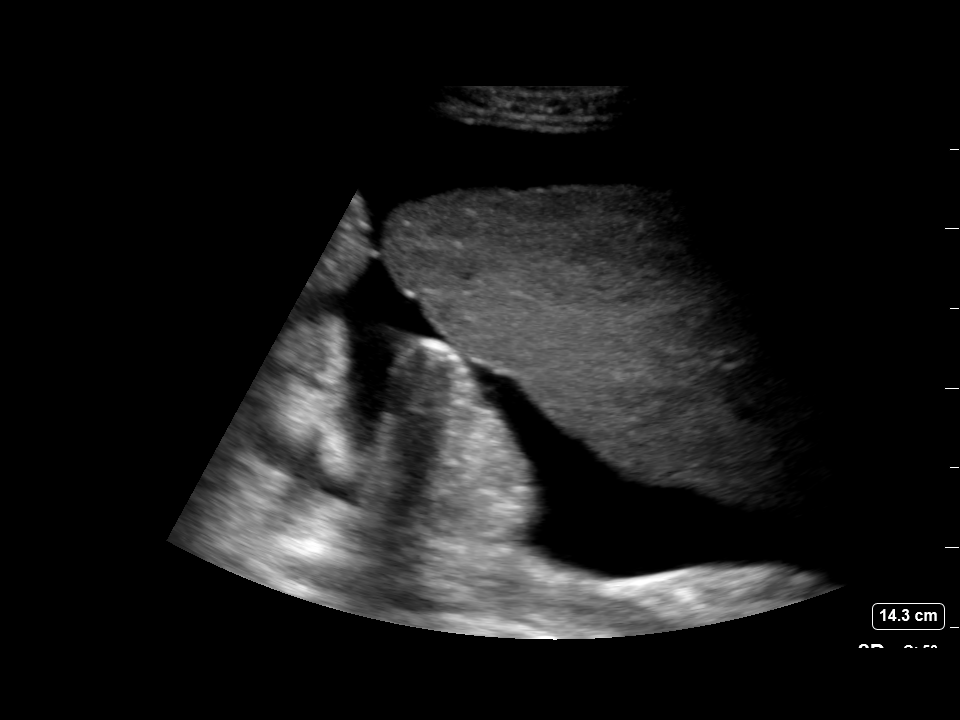

[3 of 3 positions shown; findings below may reference images not displayed]

FINDINGS: Sonographic evaluation of the abdomen demonstrates a small amount of
predominantly perihepatic intra-abdominal ascites.

While the amount of fluid is likely amenable to ultrasound-guided
paracentesis, the patient refused the procedure and as such, no
procedure was performed.
IMPRESSION: Small amount of intra-abdominal ascites, potentially amenable to
ultrasound-guided paracentesis however the present time, the patient
refuses to undergo the paracentesis

Referring physician was made aware of patient's request.

## 2019-05-13 ENCOUNTER — Emergency Department (HOSPITAL_COMMUNITY): Payer: Medicare Other

## 2019-05-13 ENCOUNTER — Other Ambulatory Visit: Payer: Self-pay

## 2019-05-13 ENCOUNTER — Emergency Department (HOSPITAL_COMMUNITY)
Admission: EM | Admit: 2019-05-13 | Discharge: 2019-05-13 | Disposition: A | Discharge status: Home / Self Care | Payer: Medicare Other | Attending: Emergency Medicine | Admitting: Emergency Medicine

## 2019-05-13 DIAGNOSIS — Z79899 Other long term (current) drug therapy: Secondary | ICD-10-CM | POA: Insufficient documentation

## 2019-05-13 DIAGNOSIS — I10 Essential (primary) hypertension: Secondary | ICD-10-CM | POA: Diagnosis not present

## 2019-05-13 DIAGNOSIS — Z992 Dependence on renal dialysis: Secondary | ICD-10-CM | POA: Insufficient documentation

## 2019-05-13 DIAGNOSIS — R1084 Generalized abdominal pain: Secondary | ICD-10-CM | POA: Diagnosis not present

## 2019-05-13 DIAGNOSIS — R188 Other ascites: Secondary | ICD-10-CM | POA: Insufficient documentation

## 2019-05-13 DIAGNOSIS — F17210 Nicotine dependence, cigarettes, uncomplicated: Secondary | ICD-10-CM | POA: Insufficient documentation

## 2019-05-13 DIAGNOSIS — R0602 Shortness of breath: Secondary | ICD-10-CM | POA: Diagnosis not present

## 2019-05-13 DIAGNOSIS — J449 Chronic obstructive pulmonary disease, unspecified: Secondary | ICD-10-CM | POA: Diagnosis not present

## 2019-05-13 DIAGNOSIS — I120 Hypertensive chronic kidney disease with stage 5 chronic kidney disease or end stage renal disease: Secondary | ICD-10-CM | POA: Diagnosis not present

## 2019-05-13 DIAGNOSIS — N186 End stage renal disease: Secondary | ICD-10-CM | POA: Diagnosis not present

## 2019-05-13 DIAGNOSIS — R109 Unspecified abdominal pain: Secondary | ICD-10-CM | POA: Diagnosis present

## 2019-05-13 LAB — CULTURE, BODY FLUID W GRAM STAIN -BOTTLE: Culture: NO GROWTH

## 2019-05-13 MED ORDER — HYDROMORPHONE HCL 1 MG/ML IJ SOLN: 1.0000 mg | Freq: Once | INTRAMUSCULAR | Status: DC

## 2019-05-13 MED ORDER — HYDROMORPHONE HCL 1 MG/ML IJ SOLN
1.0000 mg | Freq: Once | INTRAMUSCULAR | Status: AC
  Administered 2019-05-13: 1 mg via INTRAVENOUS
  Filled 2019-05-13: qty 1

## 2019-05-13 NOTE — ED Provider Notes (Addendum)
[facility] EMERGENCY DEPARTMENT Provider Note   CSN: [id number] Arrival date & time: 05/13/19  1102     History   Chief Complaint Chief Complaint  Patient presents with  . Abdominal Pain    HPI [name] is a 58 y.o. male PMH significant for CAD, HTN, COPD, ESRD on dialysis M/W/F, atrial fibrillation, cirrhosis, hepatitis, and ascites that requires drainage every 2 weeks who presents to the ED with generalized abdominal discomfort and mild associated SOB.  He reports having 3 L of fluid drained from abdomen last week.  Evidently patient was in A. fib with RVR on EMS arrival and received Cardizem.  Patient is followed by [facility] here in [place], [place].  He was evaluated in the ED 6 days ago for the same complaints and received extensive work-up and ultimately was scheduled for IR paracentesis the following day with Dr. Hilarie Fredrickson with [facility].  Patient confirmed that this is the same abdominal discomfort he experiences when he has ascites.  Patient denies any headache, dizziness, fevers or chills, new or different abdominal discomfort, chest pain, difficulty breathing, nausea or vomiting, hematemesis, or any other symptoms.     HPI  Past Medical History:  Diagnosis Date  . Anemia   . Anxiety   . Arthritis    left shoulder  . Atherosclerosis of aorta ([place])   . Cardiomegaly   . Chest pain    DATE UNKNOWN, C/O PERIODICALLY  . Cocaine abuse ([place])   . COPD exacerbation ([place]) 08/17/2016  . Coronary artery disease    stent 02/22/17  . ESRD (end stage renal disease) on dialysis ([place])    "[name]; MWF" (07/04/2017)  . GERD (gastroesophageal reflux disease)    DATE UNKNOWN  . Hemorrhoids   . Hepatitis B, chronic ([place])   . Hepatitis C   . History of kidney stones   . Hyperkalemia   . Hypertension   . Kidney failure   . Metabolic bone disease    Patient denies  . Mitral stenosis   . Myocardial infarction ([place])   . Pneumonia   .  Pulmonary edema   . Solitary rectal ulcer syndrome 07/2017   at flex sig for rectal bleeding  . Tubular adenoma of colon     Patient Active Problem List   Diagnosis Date Noted  . Melena   . Pressure injury of skin 03/09/2019  . Abdominal distention   . Volume overload 12/28/2018  . Sepsis ([place]) 09/12/2018  . Atherosclerosis of native coronary artery of native heart without angina pectoris 03/11/2018  . Benign neoplasm of cecum   . Benign neoplasm of ascending colon   . Benign neoplasm of descending colon   . Benign neoplasm of rectum   . Paroxysmal atrial fibrillation ([place]) 01/23/2018  . Hx of colonic polyps 01/20/2018  . ESRD (end stage renal disease) ([place]) 11/21/2017  . GERD (gastroesophageal reflux disease) 11/16/2017  . Liver cirrhosis ([place]) 11/15/2017  . DNR (do not resuscitate)   . Palliative care by specialist   . Hyponatremia 11/04/2017  . SBP (spontaneous bacterial peritonitis) ([place]) 10/30/2017  . Liver disease, chronic 10/30/2017  . SOB (shortness of breath)   . Abdominal pain 10/28/2017  . Upper airway cough syndrome with flattening on f/v loop 10/13/17 c/w vcd 10/17/2017  . Elevated diaphragm 10/13/2017  . Ileus ([place]) 09/29/2017  . Malnutrition of moderate degree 09/29/2017  . Sinus congestion 09/03/2017  . Symptomatic anemia 09/02/2017  . Other cirrhosis of liver ([place]) 09/02/2017  .  Left bundle branch block 09/02/2017  . Mitral stenosis 09/02/2017  . Hematochezia 07/15/2017  . Wide-complex tachycardia ([place])   . Endotracheally intubated   . ESRD on dialysis ([place]) 07/04/2017  . Acute respiratory failure with hypoxia ([place]) 06/18/2017  . CKD (chronic kidney disease) stage V requiring chronic dialysis ([place]) 06/18/2017  . History of Cocaine abuse ([place]) 06/18/2017  . Hypertension 06/18/2017  . Infection of AV graft for dialysis ([place]) 06/18/2017  . Anxiety 06/18/2017  . Anemia due to chronic kidney disease 06/18/2017  . Atrial flutter with rapid ventricular  response ([place]) 06/18/2017  . Personality disorder ([place]) 06/13/2017  . Cellulitis 06/12/2017  . Adjustment disorder with mixed anxiety and depressed mood 06/10/2017  . Suicidal ideation 06/10/2017  . Arm wound, left, sequela 06/10/2017  . Dyspnea on exertion 05/29/2017  . Tachycardia 05/29/2017  . Hyperkalemia 05/22/2017  . Acute metabolic encephalopathy   . Anemia 04/23/2017  . Ascites 04/23/2017  . COPD (chronic obstructive pulmonary disease) ([place]) 04/23/2017  . Acute on chronic respiratory failure with hypoxia ([place]) 03/25/2017  . Arrhythmia 03/25/2017  . COPD GOLD 0 with flattening on inps f/v  09/27/2016  . Essential hypertension 09/27/2016  . Fluid overload 08/30/2016  . COPD exacerbation ([place]) 08/17/2016  . Hypertensive urgency 08/17/2016  . Respiratory failure ([place]) 08/17/2016  . Problem with dialysis access ([place]) 07/23/2016  . Chronic hepatitis B ([place]) 03/05/2014  . Chronic hepatitis C without hepatic coma ([place]) 03/05/2014  . Internal hemorrhoids with bleeding, swelling and itching 03/05/2014  . Thrombocytopenia ([place]) 03/05/2014  . Chest pain 02/27/2014  . Alcohol abuse 04/14/2009  . Cigarette smoker 04/14/2009  . GANGLION CYST 04/14/2009    Past Surgical History:  Procedure Laterality Date  . A/V FISTULAGRAM Left 05/26/2017   Procedure: A/V FISTULAGRAM;  Surgeon: Conrad , MD;  Location: [facility];  Service: Cardiovascular;  Laterality: Left;  . A/V FISTULAGRAM Right 11/18/2017   Procedure: A/V FISTULAGRAM - Right Arm;  Surgeon: Elam Dutch, MD;  Location: [facility];  Service: Cardiovascular;  Laterality: Right;  . APPLICATION OF WOUND VAC Left 06/14/2017   Procedure: APPLICATION OF WOUND VAC;  Surgeon: Katha Cabal, MD;  Location: [facility];  Service: Vascular;  Laterality: Left;  . AV FISTULA PLACEMENT  2012   BELIEVED WAS PLACED IN JUNE  . AV FISTULA PLACEMENT Right 08/09/2017   Procedure: Creation Right arm ARTERIOVENOUS  BRACHIOCEPOHALIC FISTULA;  Surgeon: Elam Dutch, MD;  Location[facility];  Service: Vascular;  Laterality: Right;  . AV FISTULA PLACEMENT Right 11/22/2017   Procedure: INSERTION OF ARTERIOVENOUS (AV) GORE-TEX GRAFT RIGHT UPPER ARM;  Surgeon: Elam Dutch, MD;  Location: [facility];  Service: Vascular;  Laterality: Right;  . BIOPSY  01/25/2018   Procedure: BIOPSY;  Surgeon: Jerene Bears, MD;  Location: [facility];  Service: Gastroenterology;;  . BIOPSY  04/10/2019   Procedure: BIOPSY;  Surgeon: Jerene Bears, MD;  Location: [facility];  Service: Gastroenterology;;  . COLONOSCOPY    . COLONOSCOPY WITH PROPOFOL N/A 01/25/2018   Procedure: COLONOSCOPY WITH PROPOFOL;  Surgeon: Jerene Bears, MD;  Location: [facility];  Service: Gastroenterology;  Laterality: N/A;  . CORONARY STENT INTERVENTION N/A 02/22/2017   Procedure: CORONARY STENT INTERVENTION;  Surgeon: Nigel Mormon, MD;  Location: [facility] CV LAB;  Service: Cardiovascular;  Laterality: N/A;  . ESOPHAGOGASTRODUODENOSCOPY (EGD) WITH PROPOFOL N/A 01/25/2018   Procedure: ESOPHAGOGASTRODUODENOSCOPY (EGD) WITH PROPOFOL;  Surgeon: Jerene Bears, MD;  Location:  [facility];  Service: Gastroenterology;  Laterality: N/A;  . ESOPHAGOGASTRODUODENOSCOPY (EGD) WITH PROPOFOL N/A 04/10/2019   Procedure: ESOPHAGOGASTRODUODENOSCOPY (EGD) WITH PROPOFOL;  Surgeon: Jerene Bears, MD;  Location: [facility];  Service: Gastroenterology;  Laterality: N/A;  . FLEXIBLE SIGMOIDOSCOPY N/A 07/15/2017   Procedure: FLEXIBLE SIGMOIDOSCOPY;  Surgeon: Carol Ada, MD;  Location: [facility];  Service: Endoscopy;  Laterality: N/A;  . HEMORRHOID BANDING    . I&D EXTREMITY Left 06/01/2017   Procedure: IRRIGATION AND DEBRIDEMENT LEFT ARM HEMATOMA WITH LIGATION OF LEFT ARM AV FISTULA;  Surgeon: Elam Dutch, MD;  Location: [facility];  Service: Vascular;  Laterality: Left;  . I&D EXTREMITY Left 06/14/2017   Procedure: IRRIGATION AND DEBRIDEMENT EXTREMITY;  Surgeon:  Katha Cabal, MD;  Location: [facility];  Service: Vascular;  Laterality: Left;  . INSERTION OF DIALYSIS CATHETER  05/30/2017  . INSERTION OF DIALYSIS CATHETER N/A 05/30/2017   Procedure: INSERTION OF DIALYSIS CATHETER;  Surgeon: Elam Dutch, MD;  Location: [facility];  Service: Vascular;  Laterality: N/A;  . IR PARACENTESIS  08/30/2017  . IR PARACENTESIS  09/29/2017  . IR PARACENTESIS  10/28/2017  . IR PARACENTESIS  11/09/2017  . IR PARACENTESIS  11/16/2017  . IR PARACENTESIS  11/28/2017  . IR PARACENTESIS  12/01/2017  . IR PARACENTESIS  12/06/2017  . IR PARACENTESIS  01/03/2018  . IR PARACENTESIS  01/23/2018  . IR PARACENTESIS  02/07/2018  . IR PARACENTESIS  02/21/2018  . IR PARACENTESIS  03/06/2018  . IR PARACENTESIS  03/17/2018  . IR PARACENTESIS  04/04/2018  . IR PARACENTESIS  12/28/2018  . IR PARACENTESIS  01/08/2019  . IR PARACENTESIS  01/23/2019  . IR PARACENTESIS  02/01/2019  . IR PARACENTESIS  02/19/2019  . IR PARACENTESIS  03/01/2019  . IR PARACENTESIS  03/15/2019  . IR PARACENTESIS  04/03/2019  . IR PARACENTESIS  04/12/2019  . IR PARACENTESIS  05/01/2019  . IR PARACENTESIS  05/08/2019  . IR RADIOLOGIST EVAL & MGMT  02/14/2018  . IR RADIOLOGIST EVAL & MGMT  02/22/2019  . LEFT HEART CATH AND CORONARY ANGIOGRAPHY N/A 02/22/2017   Procedure: LEFT HEART CATH AND CORONARY ANGIOGRAPHY;  Surgeon: Nigel Mormon, MD;  Location: [facility];  Service: Cardiovascular;  Laterality: N/A;  . LIGATION OF ARTERIOVENOUS  FISTULA Left 09/11/8248   Procedure: Plication of Left Arm Arteriovenous Fistula;  Surgeon: Elam Dutch, MD;  Location: [facility];  Service: Vascular;  Laterality: Left;  . POLYPECTOMY    . POLYPECTOMY  01/25/2018   Procedure: POLYPECTOMY;  Surgeon: Jerene Bears, MD;  Location: [facility];  Service: Gastroenterology;;  . REVISON OF ARTERIOVENOUS FISTULA Left 5/39/7673   Procedure: PLICATION OF DISTAL ANEURYSMAL SEGEMENT OF LEFT UPPER ARM ARTERIOVENOUS FISTULA;  Surgeon: Elam Dutch, MD;  Location: [facility];  Service: Vascular;  Laterality: Left;  . REVISON OF ARTERIOVENOUS FISTULA Left 10/28/3788   Procedure: Plication of Left Upper Arm Fistula ;  Surgeon: Waynetta Sandy, MD;  Location: [facility];  Service: Vascular;  Laterality: Left;  . SKIN GRAFT SPLIT THICKNESS LEG / FOOT Left    SKIN GRAFT SPLIT THICKNESS LEFT ARM DONOR SITE: LEFT ANTERIOR THIGH  . SKIN SPLIT GRAFT Left 07/04/2017   Procedure: SKIN GRAFT SPLIT THICKNESS LEFT ARM DONOR SITE: LEFT ANTERIOR THIGH;  Surgeon: Elam Dutch, MD;  Location: [facility];  Service: Vascular;  Laterality: Left;  . THROMBECTOMY W/ EMBOLECTOMY Left 06/05/2017   Procedure: EXPLORATION OF LEFT ARM FOR BLEEDING;  OVERSEWED PROXIMAL FISTULA;  Surgeon: Angelia Mould, MD;  Location: [facility];  Service: Vascular;  Laterality: Left;  . WOUND EXPLORATION Left 06/03/2017   Procedure: WOUND EXPLORATION WITH WOUND VAC APPLICATION TO LEFT ARM;  Surgeon: Angelia Mould, MD;  Location: [facility];  Service: Vascular;  Laterality: Left;        Home Medications    Prior to Admission medications   Medication Sig Start Date End Date Taking? Authorizing Provider  albuterol (VENTOLIN HFA) 108 (90 Base) MCG/ACT inhaler Inhale 2 puffs into the lungs every 6 (six) hours as needed for wheezing or shortness of breath.    [provider]  hydrALAZINE (APRESOLINE) 100 MG tablet Take 100 mg by mouth 3 (three) times daily.  02/13/19   [provider]  hydrOXYzine (ATARAX/VISTARIL) 25 MG tablet Take 25 mg by mouth 3 (three) times daily as needed for itching.  02/14/19   [provider]  lactulose (CHRONULAC) 10 GM/15ML solution TAKE 45 MLS BY MOUTH 2 TIMES DAILY. 05/11/19   Pyrtle, Lajuan Lines, MD  metoprolol tartrate (LOPRESSOR) 25 MG tablet Take 1 tablet (25 mg total) by mouth 2 (two) times daily. 03/11/18   Geradine Girt, DO  montelukast (SINGULAIR) 10 MG tablet Take 10 mg by mouth every evening.     [provider]  ondansetron (ZOFRAN ODT) 4 MG disintegrating tablet Take 1 tablet (4 mg total) by mouth every 8 (eight) hours as needed for nausea or vomiting. 05/07/19   Fredia Sorrow, MD  ondansetron (ZOFRAN) 8 MG tablet Take 8 mg by mouth every 8 (eight) hours as needed for nausea or vomiting.     [provider]  pantoprazole (PROTONIX) 40 MG tablet Take 1 tablet (40 mg total) by mouth daily. Patient taking differently: Take 40 mg by mouth every evening.  03/22/19   Zehr, Laban Emperor, PA-C  sevelamer carbonate (RENVELA) 800 MG tablet Take 4 tablets with meals  And 2 tablets twice daily with snacks Patient taking differently: Take 1,600 mg by mouth See admin instructions. Take 2 tablets (1600mg ) with meals & with snacks 01/22/18   Patrecia Pour, MD  sulfamethoxazole-trimethoprim (BACTRIM DS) 800-160 MG tablet Take 1 tablet by mouth daily. Patient taking differently: Take 1 tablet by mouth 2 (two) times daily.  12/30/18   Geradine Girt, DO  SYMBICORT 160-4.5 MCG/ACT inhaler Inhale 2 puffs into the lungs 2 (two) times daily.  11/30/18   [provider]  zolpidem (AMBIEN) 10 MG tablet Take 10 mg by mouth at bedtime as needed for sleep.    [provider]    Family History Family History  Problem Relation Age of Onset  . Heart disease Mother   . Lung cancer Mother   . Heart disease Father   . Malignant hyperthermia Father   . COPD Father   . Throat cancer Sister   . Esophageal cancer Sister   . Hypertension Other   . COPD Other   . Colon cancer Neg Hx   . Colon polyps Neg Hx   . Rectal cancer Neg Hx   . Stomach cancer Neg Hx     Social History Social History   Tobacco Use  . Smoking status: Current Every Day Smoker    Packs/day: 0.50    Years: 43.00    Pack years: 21.50    Types: Cigarettes    Start date: 08/13/1973  . Smokeless tobacco: Never Used  . Tobacco comment: i dont know i just  make   Substance Use Topics  . Alcohol use: Not Currently     Frequency: Never    Comment: quit drinking in 2017  . Drug use: Yes    Types: Marijuana, Cocaine    Comment: reports using once every 3 months, 04-06-2019 was this      Allergies   Morphine and related, Aspirin, Clonidine derivatives, Tramadol, and Tylenol [acetaminophen]   Review of Systems Review of Systems  All other systems reviewed and are negative.    Physical Exam Updated Vital Signs BP (!) 116/94   Pulse 68   Temp 97.9 F (36.6 C) (Oral)   Resp 14   Ht 5\' 10"  (1.778 m)   Wt 68 kg   SpO2 98%   BMI 21.52 kg/m   Physical Exam   ED Treatments / Results  Labs (all labs ordered are listed, but only abnormal results are displayed) Labs Reviewed - No data to display  EKG None  Radiology Dg Chest Portable 1 View  Result Date: 05/13/2019 CLINICAL DATA:  Shortness of breath. Increasing fluid in the abdomen per patient with 3 L of fluid removed last week. EXAM: PORTABLE CHEST 1 VIEW COMPARISON:  Chest radiograph and abdomen and pelvis CT dated 05/07/2019. FINDINGS: Stable markedly enlarged cardiac silhouette. Mild increase in diffuse prominence of the interstitial markings. No visible pleural fluid. The right lateral costophrenic angle and inferior left lateral costophrenic angle are not included. Extensive atheromatous arterial calcifications. Unremarkable bones. IMPRESSION: 1. Stable marked cardiomegaly and pericardial effusion. 2. Mild changes of congestive heart failure superimposed on chronic interstitial lung disease. 3. Extensive atheromatous arterial calcifications. Electronically Signed   By: Claudie Revering M.D.   On: 05/13/2019 12:46    Procedures Procedures (including critical care time)  Medications Ordered in ED Medications  HYDROmorphone (DILAUDID) injection 1 mg (1 mg Intravenous Given 05/13/19 1242)     Initial Impression / Assessment and Plan / ED Course  I have reviewed the triage vital signs and the nursing notes.  Pertinent labs & imaging  results that were available during my care of the patient were reviewed by me and considered in my medical decision making (see chart for details).        His vitals and physical exam is not concerning for spontaneous bacterial peritonitis.  He is afebrile and he had no significant tenderness to palpation on my exams.  Discussed case with Dr. Roderic Palau who personally evaluated patient and did not feel as though any additional lab work-up was warranted.  Patient's vitals are all within normal limits on my examination.   Patient is not complaining of any chest pain, his vital signs are all within normal months, he is not diaphoretic or appears in any acute distress. Low suspicion for ACS.  Do not feel troponin work-up is indicated.  His EKG demonstrates atrial fibrillation, but is unchanged since last tracing.  His is not RVR and is HR is in the 70s. He is in no obvious respiratory distress and he is not tachypneic.  Denies any immobilization or leg swelling.  He does not have any cough and his chest x-ray was interpreted and demonstrates stable pericardial effusion but no acute cardiopulmonary findings.  Called and spoke with [facility] and inquired as to whether or not we should schedule him for IR paracentesis or have him follow-up with them in their office.  Spoke with Berniece Pap NP who will speak with Dr. Hilarie Fredrickson tomorrow and will reach out to the patient. She appreciates  [place] putting in a discharge order for outpatient IR paracentesis.   His last IR paracentesis was on 05/08/2019 and he had 2 L of clear yellow fluid removed.  Previously, he would only have paracenteses performed every couple weeks so it seems as though today's presentation is unusually premature.  He has dialysis scheduled every M/W/F so will schedule IR paracentesis outpatient for Tuesday or Thursday.  Patient received pain medication while here in the ER before discharge.  He voiced understanding and agreement for plan.   Instructed to return to the ED or seek medical attention should he develop any fevers or chills, worsening or different abdominal pain, difficulty breathing, chest pain, uncontrolled nausea or vomiting, or any other new or worsening symptoms.   Final Clinical Impressions(s) / ED Diagnoses   Final diagnoses:  Generalized abdominal pain    ED Discharge Orders         Ordered    IR Paracentesis    Comments: Patient has dialysis M/W/F, so preferably Tuesday if possible.   05/13/19 1311           Corena Herter, PA-C 05/13/19 1311    Corena Herter, PA-C 05/13/19 1316    Milton Ferguson, MD 05/14/19 1000

## 2019-05-13 NOTE — ED Triage Notes (Signed)
Pt BIB [facility] from home. Pt complaining of shortness of breath and increasing fluid in abdomen. Pt reports having 3L fluid taken off abdomen last week. Pt in afib rvr 180-200 upon EMS arrival. Received 20 cardizem rate 60-80. Pt has history of afib. Pt also dialysis pt. VSS. NAD.

## 2019-05-13 NOTE — Discharge Instructions (Addendum)
Please attend your scheduled dialysis appointment tomorrow.  I have placed an order for IR paracentesis to call you to schedule an appointment.  I have requested Tuesday given your dialysis Monday Wednesday and Friday.  Dr. Hilarie Fredrickson or NP Berniece Pap will reach out to you tomorrow to help determine plan for ongoing care.  Please return to the ED or seek medical attention should he develop any fevers or chills, worsening or different abdominal pain, difficulty breathing, chest pain, uncontrolled nausea or vomiting, or any other new or worsening symptoms.

## 2019-05-14 ENCOUNTER — Other Ambulatory Visit: Payer: Self-pay

## 2019-05-14 ENCOUNTER — Emergency Department (HOSPITAL_COMMUNITY): Payer: Medicare Other

## 2019-05-14 ENCOUNTER — Emergency Department (HOSPITAL_COMMUNITY)
Admission: EM | Admit: 2019-05-14 | Discharge: 2019-05-14 | Disposition: A | Discharge status: Home / Self Care | Payer: Medicare Other | Attending: Emergency Medicine | Admitting: Emergency Medicine

## 2019-05-14 ENCOUNTER — Encounter (HOSPITAL_COMMUNITY): Payer: Self-pay

## 2019-05-14 DIAGNOSIS — I120 Hypertensive chronic kidney disease with stage 5 chronic kidney disease or end stage renal disease: Secondary | ICD-10-CM | POA: Diagnosis not present

## 2019-05-14 DIAGNOSIS — F17210 Nicotine dependence, cigarettes, uncomplicated: Secondary | ICD-10-CM | POA: Insufficient documentation

## 2019-05-14 DIAGNOSIS — I2510 Atherosclerotic heart disease of native coronary artery without angina pectoris: Secondary | ICD-10-CM | POA: Insufficient documentation

## 2019-05-14 DIAGNOSIS — Z20828 Contact with and (suspected) exposure to other viral communicable diseases: Secondary | ICD-10-CM | POA: Insufficient documentation

## 2019-05-14 DIAGNOSIS — F1410 Cocaine abuse, uncomplicated: Secondary | ICD-10-CM | POA: Insufficient documentation

## 2019-05-14 DIAGNOSIS — J449 Chronic obstructive pulmonary disease, unspecified: Secondary | ICD-10-CM | POA: Insufficient documentation

## 2019-05-14 DIAGNOSIS — K652 Spontaneous bacterial peritonitis: Secondary | ICD-10-CM | POA: Diagnosis not present

## 2019-05-14 DIAGNOSIS — R1084 Generalized abdominal pain: Secondary | ICD-10-CM | POA: Diagnosis not present

## 2019-05-14 DIAGNOSIS — R0602 Shortness of breath: Secondary | ICD-10-CM | POA: Diagnosis not present

## 2019-05-14 DIAGNOSIS — N186 End stage renal disease: Secondary | ICD-10-CM | POA: Insufficient documentation

## 2019-05-14 DIAGNOSIS — Z79899 Other long term (current) drug therapy: Secondary | ICD-10-CM | POA: Insufficient documentation

## 2019-05-14 DIAGNOSIS — F1210 Cannabis abuse, uncomplicated: Secondary | ICD-10-CM | POA: Diagnosis not present

## 2019-05-14 DIAGNOSIS — I4891 Unspecified atrial fibrillation: Secondary | ICD-10-CM | POA: Insufficient documentation

## 2019-05-14 LAB — CBC WITH DIFFERENTIAL/PLATELET
Abs Immature Granulocytes: 0.04 10*3/uL (ref 0.00–0.07)
Basophils Absolute: 0.1 10*3/uL (ref 0.0–0.1)
Basophils Relative: 1 %
Eosinophils Absolute: 0.1 10*3/uL (ref 0.0–0.5)
Eosinophils Relative: 1 %
HCT: 32.8 % — ABNORMAL LOW (ref 39.0–52.0)
Hemoglobin: 11.4 g/dL — ABNORMAL LOW (ref 13.0–17.0)
Immature Granulocytes: 0 %
Lymphocytes Relative: 16 %
Lymphs Abs: 1.5 10*3/uL (ref 0.7–4.0)
MCH: 27.5 pg (ref 26.0–34.0)
MCHC: 34.8 g/dL (ref 30.0–36.0)
MCV: 79 fL — ABNORMAL LOW (ref 80.0–100.0)
Monocytes Absolute: 0.7 10*3/uL (ref 0.1–1.0)
Monocytes Relative: 8 %
Neutro Abs: 7.1 10*3/uL (ref 1.7–7.7)
Neutrophils Relative %: 74 %
Platelets: 189 10*3/uL (ref 150–400)
RBC: 4.15 MIL/uL — ABNORMAL LOW (ref 4.22–5.81)
RDW: 14.5 % (ref 11.5–15.5)
WBC: 9.6 10*3/uL (ref 4.0–10.5)
nRBC: 0 % (ref 0.0–0.2)

## 2019-05-14 LAB — AMMONIA: Ammonia: 29 umol/L (ref 9–35)

## 2019-05-14 LAB — COMPREHENSIVE METABOLIC PANEL
ALT: 12 U/L (ref 0–44)
AST: 23 U/L (ref 15–41)
Albumin: 3.4 g/dL — ABNORMAL LOW (ref 3.5–5.0)
Alkaline Phosphatase: 74 U/L (ref 38–126)
Anion gap: 18 — ABNORMAL HIGH (ref 5–15)
BUN: 32 mg/dL — ABNORMAL HIGH (ref 6–20)
CO2: 23 mmol/L (ref 22–32)
Calcium: 9.6 mg/dL (ref 8.9–10.3)
Chloride: 95 mmol/L — ABNORMAL LOW (ref 98–111)
Creatinine, Ser: 8.78 mg/dL — ABNORMAL HIGH (ref 0.61–1.24)
GFR calc Af Amer: 7 mL/min — ABNORMAL LOW (ref >60)
GFR calc non Af Amer: 6 mL/min — ABNORMAL LOW (ref >60)
Glucose, Bld: 89 mg/dL (ref 70–99)
Potassium: 5.8 mmol/L — ABNORMAL HIGH (ref 3.5–5.1)
Sodium: 136 mmol/L (ref 135–145)
Total Bilirubin: 1.2 mg/dL (ref 0.3–1.2)
Total Protein: 6.5 g/dL (ref 6.5–8.1)

## 2019-05-14 LAB — SYNOVIAL CELL COUNT + DIFF, W/ CRYSTALS
Crystals, Fluid: NONE SEEN
Eosinophils-Synovial: 1 % (ref 0–1)
Lymphocytes-Synovial Fld: 66 % — ABNORMAL HIGH (ref 0–20)
Monocyte-Macrophage-Synovial Fluid: 30 % — ABNORMAL LOW (ref 50–90)
Neutrophil, Synovial: 3 % (ref 0–25)
WBC, Synovial: 58 /mm3 (ref 0–200)

## 2019-05-14 LAB — SARS CORONAVIRUS 2 (TAT 6-24 HRS): SARS Coronavirus 2: NEGATIVE

## 2019-05-14 LAB — PROTIME-INR
INR: 1.2 (ref 0.8–1.2)
Prothrombin Time: 14.7 seconds (ref 11.4–15.2)

## 2019-05-14 MED ORDER — SODIUM CHLORIDE 0.9 % IV SOLN
2.0000 g | Freq: Once | INTRAVENOUS | Status: AC
  Administered 2019-05-14: 2 g via INTRAVENOUS
  Filled 2019-05-14: qty 20

## 2019-05-14 MED ORDER — METOPROLOL TARTRATE 25 MG PO TABS: 25.0000 mg | ORAL_TABLET | Freq: Once | ORAL | Status: DC

## 2019-05-14 MED ORDER — DIPHENOXYLATE-ATROPINE 2.5-0.025 MG PO TABS
2.0000 | ORAL_TABLET | Freq: Once | ORAL | Status: AC
  Administered 2019-05-14: 2 via ORAL
  Filled 2019-05-14: qty 2

## 2019-05-14 MED ORDER — FENTANYL CITRATE (PF) 100 MCG/2ML IJ SOLN
100.0000 ug | Freq: Once | INTRAMUSCULAR | Status: AC
  Administered 2019-05-14: 100 ug via INTRAVENOUS
  Filled 2019-05-14: qty 2

## 2019-05-14 NOTE — ED Notes (Signed)
Breakfast order placed ?

## 2019-05-14 NOTE — ED Provider Notes (Addendum)
[facility] EMERGENCY DEPARTMENT Provider Note   CSN: [id number] Arrival date & time: 05/14/19  0539     History   Chief Complaint Chief Complaint  Patient presents with  . Shortness of Breath    HPI [name] is a 58 y.o. male.     Patient presents to the emergency department for evaluation of abdominal pain, abdominal distention and shortness of breath.  Patient has a history of cirrhosis.  He reports that his abdomen has been filling up with fluid causing him distention and pain.  This abdominal distention causes him to feel like he cannot catch his breath.  He reports that he is due for dialysis this morning, however he feels that if he had "showed up for dialysis in this state, they would have sent me to the ER".     Past Medical History:  Diagnosis Date  . Anemia   . Anxiety   . Arthritis    left shoulder  . Atherosclerosis of aorta ([place])   . Cardiomegaly   . Chest pain    DATE UNKNOWN, C/O PERIODICALLY  . Cocaine abuse ([place])   . COPD exacerbation ([place]) 08/17/2016  . Coronary artery disease    stent 02/22/17  . ESRD (end stage renal disease) on dialysis ([place])    "[name]; MWF" (07/04/2017)  . GERD (gastroesophageal reflux disease)    DATE UNKNOWN  . Hemorrhoids   . Hepatitis B, chronic ([place])   . Hepatitis C   . History of kidney stones   . Hyperkalemia   . Hypertension   . Kidney failure   . Metabolic bone disease    Patient denies  . Mitral stenosis   . Myocardial infarction ([place])   . Pneumonia   . Pulmonary edema   . Solitary rectal ulcer syndrome 07/2017   at flex [facility] for rectal bleeding  . Tubular adenoma of colon     Patient Active Problem List   Diagnosis Date Noted  . Melena   . Pressure injury of skin 03/09/2019  . Abdominal distention   . Volume overload 12/28/2018  . Sepsis ([place]) 09/12/2018  . Atherosclerosis of native coronary artery of native heart without angina pectoris 03/11/2018  . Benign  neoplasm of cecum   . Benign neoplasm of ascending colon   . Benign neoplasm of descending colon   . Benign neoplasm of rectum   . Paroxysmal atrial fibrillation ([place]) 01/23/2018  . Hx of colonic polyps 01/20/2018  . ESRD (end stage renal disease) ([place]) 11/21/2017  . GERD (gastroesophageal reflux disease) 11/16/2017  . Liver cirrhosis ([place]) 11/15/2017  . DNR (do not resuscitate)   . Palliative care by specialist   . Hyponatremia 11/04/2017  . SBP (spontaneous bacterial peritonitis) ([place]) 10/30/2017  . Liver disease, chronic 10/30/2017  . SOB (shortness of breath)   . Abdominal pain 10/28/2017  . Upper airway cough syndrome with flattening on f/v loop 10/13/17 c/w vcd 10/17/2017  . Elevated diaphragm 10/13/2017  . Ileus ([place]) 09/29/2017  . Malnutrition of moderate degree 09/29/2017  . Sinus congestion 09/03/2017  . Symptomatic anemia 09/02/2017  . Other cirrhosis of liver ([place]) 09/02/2017  . Left bundle branch block 09/02/2017  . Mitral stenosis 09/02/2017  . Hematochezia 07/15/2017  . Wide-complex tachycardia ([place])   . Endotracheally intubated   . ESRD on dialysis ([place]) 07/04/2017  . Acute respiratory failure with hypoxia ([place]) 06/18/2017  . CKD (chronic kidney disease) stage V requiring chronic dialysis ([place]) 06/18/2017  .  History of Cocaine abuse ([place]) 06/18/2017  . Hypertension 06/18/2017  . Infection of AV graft for dialysis ([place]) 06/18/2017  . Anxiety 06/18/2017  . Anemia due to chronic kidney disease 06/18/2017  . Atrial flutter with rapid ventricular response ([place]) 06/18/2017  . Personality disorder ([place]) 06/13/2017  . Cellulitis 06/12/2017  . Adjustment disorder with mixed anxiety and depressed mood 06/10/2017  . Suicidal ideation 06/10/2017  . Arm wound, left, sequela 06/10/2017  . Dyspnea on exertion 05/29/2017  . Tachycardia 05/29/2017  . Hyperkalemia 05/22/2017  . Acute metabolic encephalopathy   . Anemia 04/23/2017  . Ascites 04/23/2017  . COPD (chronic  obstructive pulmonary disease) ([place]) 04/23/2017  . Acute on chronic respiratory failure with hypoxia ([place]) 03/25/2017  . Arrhythmia 03/25/2017  . COPD GOLD 0 with flattening on inps f/v  09/27/2016  . Essential hypertension 09/27/2016  . Fluid overload 08/30/2016  . COPD exacerbation ([place]) 08/17/2016  . Hypertensive urgency 08/17/2016  . Respiratory failure ([place]) 08/17/2016  . Problem with dialysis access ([place]) 07/23/2016  . Chronic hepatitis B ([place]) 03/05/2014  . Chronic hepatitis C without hepatic coma ([place]) 03/05/2014  . Internal hemorrhoids with bleeding, swelling and itching 03/05/2014  . Thrombocytopenia ([place]) 03/05/2014  . Chest pain 02/27/2014  . Alcohol abuse 04/14/2009  . Cigarette smoker 04/14/2009  . GANGLION CYST 04/14/2009    Past Surgical History:  Procedure Laterality Date  . A/V FISTULAGRAM Left 05/26/2017   Procedure: A/V FISTULAGRAM;  Surgeon: Conrad Sherman, MD;  Location: [facility];  Service: Cardiovascular;  Laterality: Left;  . A/V FISTULAGRAM Right 11/18/2017   Procedure: A/V FISTULAGRAM - Right Arm;  Surgeon: Elam Dutch, MD;  Location: [facility];  Service: Cardiovascular;  Laterality: Right;  . APPLICATION OF WOUND VAC Left 06/14/2017   Procedure: APPLICATION OF WOUND VAC;  Surgeon: Katha Cabal, MD;  Location: [facility];  Service: Vascular;  Laterality: Left;  . AV FISTULA PLACEMENT  2012   BELIEVED WAS PLACED IN JUNE  . AV FISTULA PLACEMENT Right 08/09/2017   Procedure: Creation Right arm ARTERIOVENOUS BRACHIOCEPOHALIC FISTULA;  Surgeon: Elam Dutch, MD;  Location: [facility] OR;  Service: Vascular;  Laterality: Right;  . AV FISTULA PLACEMENT Right 11/22/2017   Procedure: INSERTION OF ARTERIOVENOUS (AV) GORE-TEX GRAFT RIGHT UPPER ARM;  Surgeon: Elam Dutch, MD;  Location: [facility];  Service: Vascular;  Laterality: Right;  . BIOPSY  01/25/2018   Procedure: BIOPSY;  Surgeon: Jerene Bears, MD;  Location: [facility];  Service:  Gastroenterology;;  . BIOPSY  04/10/2019   Procedure: BIOPSY;  Surgeon: Jerene Bears, MD;  Location: [facility];  Service: Gastroenterology;;  . COLONOSCOPY    . COLONOSCOPY WITH PROPOFOL N/A 01/25/2018   Procedure: COLONOSCOPY WITH PROPOFOL;  Surgeon: Jerene Bears, MD;  Location: [facility];  Service: Gastroenterology;  Laterality: N/A;  . CORONARY STENT INTERVENTION N/A 02/22/2017   Procedure: CORONARY STENT INTERVENTION;  Surgeon: Nigel Mormon, MD;  Location: [facility];  Service: Cardiovascular;  Laterality: N/A;  . ESOPHAGOGASTRODUODENOSCOPY (EGD) WITH PROPOFOL N/A 01/25/2018   Procedure: ESOPHAGOGASTRODUODENOSCOPY (EGD) WITH PROPOFOL;  Surgeon: Jerene Bears, MD;  Location: [facility];  Service: Gastroenterology;  Laterality: N/A;  . ESOPHAGOGASTRODUODENOSCOPY (EGD) WITH PROPOFOL N/A 04/10/2019   Procedure: ESOPHAGOGASTRODUODENOSCOPY (EGD) WITH PROPOFOL;  Surgeon: Jerene Bears, MD;  [facility]: [facility];  Service: Gastroenterology;  Laterality: N/A;  . FLEXIBLE SIGMOIDOSCOPY N/A 07/15/2017   Procedure: FLEXIBLE SIGMOIDOSCOPY;  Surgeon: Carol Ada, MD;  Location: [facility];  Service: Endoscopy;  Laterality: N/A;  . HEMORRHOID BANDING    . I&D EXTREMITY Left 06/01/2017   Procedure: IRRIGATION AND DEBRIDEMENT LEFT ARM HEMATOMA WITH LIGATION OF LEFT ARM AV FISTULA;  Surgeon: Elam Dutch, MD;  Location: [facility];  Service: Vascular;  Laterality: Left;  . I&D EXTREMITY Left 06/14/2017   Procedure: IRRIGATION AND DEBRIDEMENT EXTREMITY;  Surgeon: Katha Cabal, MD;  Location: [facility];  Service: Vascular;  Laterality: Left;  . INSERTION OF DIALYSIS CATHETER  05/30/2017  . INSERTION OF DIALYSIS CATHETER N/A 05/30/2017   Procedure: INSERTION OF DIALYSIS CATHETER;  Surgeon: Elam Dutch, MD;  Location: [facility];  Service: Vascular;  Laterality: N/A;  . IR PARACENTESIS  08/30/2017  . IR PARACENTESIS  09/29/2017  . IR PARACENTESIS  10/28/2017  . IR PARACENTESIS   11/09/2017  . IR PARACENTESIS  11/16/2017  . IR PARACENTESIS  11/28/2017  . IR PARACENTESIS  12/01/2017  . IR PARACENTESIS  12/06/2017  . IR PARACENTESIS  01/03/2018  . IR PARACENTESIS  01/23/2018  . IR PARACENTESIS  02/07/2018  . IR PARACENTESIS  02/21/2018  . IR PARACENTESIS  03/06/2018  . IR PARACENTESIS  03/17/2018  . IR PARACENTESIS  04/04/2018  . IR PARACENTESIS  12/28/2018  . IR PARACENTESIS  01/08/2019  . IR PARACENTESIS  01/23/2019  . IR PARACENTESIS  02/01/2019  . IR PARACENTESIS  02/19/2019  . IR PARACENTESIS  03/01/2019  . IR PARACENTESIS  03/15/2019  . IR PARACENTESIS  04/03/2019  . IR PARACENTESIS  04/12/2019  . IR PARACENTESIS  05/01/2019  . IR PARACENTESIS  05/08/2019  . IR RADIOLOGIST EVAL & MGMT  02/14/2018  . IR RADIOLOGIST EVAL & MGMT  02/22/2019  . LEFT HEART CATH AND CORONARY ANGIOGRAPHY N/A 02/22/2017   Procedure: LEFT HEART CATH AND CORONARY ANGIOGRAPHY;  Surgeon: Nigel Mormon, MD;  Location: [facility];  Service: Cardiovascular;  Laterality: N/A;  . LIGATION OF ARTERIOVENOUS  FISTULA Left 11/16/3218   Procedure: Plication of Left Arm Arteriovenous Fistula;  Surgeon: Elam Dutch, MD;  Location: [facility];  Service: Vascular;  Laterality: Left;  . POLYPECTOMY    . POLYPECTOMY  01/25/2018   Procedure: POLYPECTOMY;  Surgeon: Jerene Bears, MD;  Location: [facility];  Service: Gastroenterology;;  . REVISON OF ARTERIOVENOUS FISTULA Left 2/54/2706   Procedure: PLICATION OF DISTAL ANEURYSMAL SEGEMENT OF LEFT UPPER ARM ARTERIOVENOUS FISTULA;  Surgeon: Elam Dutch, MD;  Location: [facility];  Service: Vascular;  Laterality: Left;  . REVISON OF ARTERIOVENOUS FISTULA Left 2/37/6283   Procedure: Plication of Left Upper Arm Fistula ;  Surgeon: Waynetta Sandy, MD;  Location: [facility];  Service: Vascular;  Laterality: Left;  . SKIN GRAFT SPLIT THICKNESS LEG / FOOT Left    SKIN GRAFT SPLIT THICKNESS LEFT ARM DONOR SITE: LEFT ANTERIOR THIGH  . SKIN SPLIT GRAFT Left 07/04/2017    Procedure: SKIN GRAFT SPLIT THICKNESS LEFT ARM DONOR SITE: LEFT ANTERIOR THIGH;  Surgeon: Elam Dutch, MD;  Location: [facility];  Service: Vascular;  Laterality: Left;  . THROMBECTOMY W/ EMBOLECTOMY Left 06/05/2017   Procedure: EXPLORATION OF LEFT ARM FOR BLEEDING; OVERSEWED PROXIMAL FISTULA;  Surgeon: Angelia Mould, MD;  Location: [facility];  Service: Vascular;  Laterality: Left;  . WOUND EXPLORATION Left 06/03/2017   Procedure: WOUND EXPLORATION WITH WOUND VAC APPLICATION TO LEFT ARM;  Surgeon: Angelia Mould, MD;  Location: [facility];  Service: Vascular;  Laterality: Left;  Home Medications    Prior to Admission medications   Medication Sig Start Date End Date Taking? Authorizing Provider  albuterol (VENTOLIN HFA) 108 (90 Base) MCG/ACT inhaler Inhale 2 puffs into the lungs every 6 (six) hours as needed for wheezing or shortness of breath.    [provider]  hydrALAZINE (APRESOLINE) 100 MG tablet Take 100 mg by mouth 3 (three) times daily.  02/13/19   [provider]  hydrOXYzine (ATARAX/VISTARIL) 25 MG tablet Take 25 mg by mouth 3 (three) times daily as needed for itching.  02/14/19   [provider]  lactulose (CHRONULAC) 10 GM/15ML solution TAKE 45 MLS BY MOUTH 2 TIMES DAILY. 05/11/19   Pyrtle, Lajuan Lines, MD  metoprolol tartrate (LOPRESSOR) 25 MG tablet Take 1 tablet (25 mg total) by mouth 2 (two) times daily. 03/11/18   Geradine Girt, DO  montelukast (SINGULAIR) 10 MG tablet Take 10 mg by mouth every evening.     [provider]  ondansetron (ZOFRAN ODT) 4 MG disintegrating tablet Take 1 tablet (4 mg total) by mouth every 8 (eight) hours as needed for nausea or vomiting. 05/07/19   Fredia Sorrow, MD  ondansetron (ZOFRAN) 8 MG tablet Take 8 mg by mouth every 8 (eight) hours as needed for nausea or vomiting.     [provider]  pantoprazole (PROTONIX) 40 MG tablet Take 1 tablet (40 mg total) by mouth daily. Patient taking  differently: Take 40 mg by mouth every evening.  03/22/19   Zehr, Laban Emperor, PA-C  sevelamer carbonate (RENVELA) 800 MG tablet Take 4 tablets with meals  And 2 tablets twice daily with snacks Patient taking differently: Take 1,600 mg by mouth See admin instructions. Take 2 tablets (1600mg ) with meals & with snacks 01/22/18   Patrecia Pour, MD  sulfamethoxazole-trimethoprim (BACTRIM DS) 800-160 MG tablet Take 1 tablet by mouth daily. Patient taking differently: Take 1 tablet by mouth 2 (two) times daily.  12/30/18   Geradine Girt, DO  SYMBICORT 160-4.5 MCG/ACT inhaler Inhale 2 puffs into the lungs 2 (two) times daily.  11/30/18   [provider]  zolpidem (AMBIEN) 10 MG tablet Take 10 mg by mouth at bedtime as needed for sleep.    [provider]    Family History Family History  Problem Relation Age of Onset  . Heart disease Mother   . Lung cancer Mother   . Heart disease Father   . Malignant hyperthermia Father   . COPD Father   . Throat cancer Sister   . Esophageal cancer Sister   . Hypertension Other   . COPD Other   . Colon cancer Neg Hx   . Colon polyps Neg Hx   . Rectal cancer Neg Hx   . Stomach cancer Neg Hx     Social History Social History   Tobacco Use  . Smoking status: Current Every Day Smoker    Packs/day: 0.50    Years: 43.00    Pack years: 21.50    Types: Cigarettes    Start date: 08/13/1973  . Smokeless tobacco: Never Used  . Tobacco comment: i dont know i just make   Substance Use Topics  . Alcohol use: Not Currently    Frequency: Never    Comment: quit drinking in 2017  . Drug use: Yes    Types: Marijuana, Cocaine    Comment: reports using once every 3 months, 04-06-2019 was this      Allergies   Morphine and related,  Aspirin, Clonidine derivatives, Tramadol, and Tylenol [acetaminophen]   Review of Systems Review of Systems  Respiratory: Positive for shortness of breath.   Gastrointestinal: Positive for abdominal distention and  abdominal pain.  All other systems reviewed and are negative.    Physical Exam Updated Vital Signs BP (!) 133/107 (BP Location: Right Leg)   Pulse (!) 112   Temp 97.7 F (36.5 C) (Oral)   Resp (!) 32   SpO2 100%   Physical Exam Vitals signs and nursing note reviewed.  Constitutional:      General: He is not in acute distress.    Appearance: Normal appearance. He is well-developed.  HENT:     Head: Normocephalic and atraumatic.     Right Ear: Hearing normal.     Left Ear: Hearing normal.     Nose: Nose normal.  Eyes:     Conjunctiva/sclera: Conjunctivae normal.     Pupils: Pupils are equal, round, and reactive to light.  Neck:     Musculoskeletal: Normal range of motion and neck supple.  Cardiovascular:     Rate and Rhythm: Regular rhythm.     Heart sounds: S1 normal and S2 normal. No murmur. No friction rub. No gallop.   Pulmonary:     Effort: Pulmonary effort is normal. No respiratory distress.     Breath sounds: Normal breath sounds.  Chest:     Chest wall: No tenderness.  Abdominal:     General: Bowel sounds are normal. There is distension.     Palpations: Abdomen is soft.     Tenderness: There is generalized abdominal tenderness. There is no guarding or rebound. Negative signs include Murphy's sign and McBurney's sign.     Hernia: No hernia is present.  Musculoskeletal: Normal range of motion.  Skin:    General: Skin is warm and dry.     Findings: No rash.  Neurological:     Mental Status: He is alert and oriented to person, place, and time.     GCS: GCS eye subscore is 4. GCS verbal subscore is 5. GCS motor subscore is 6.     Cranial Nerves: No cranial nerve deficit.     Sensory: No sensory deficit.     Coordination: Coordination normal.  Psychiatric:        Speech: Speech normal.        Behavior: Behavior normal.        Thought Content: Thought content normal.      ED Treatments / Results  Labs (all labs ordered are listed, but only abnormal results  are displayed) Labs Reviewed  CBC WITH DIFFERENTIAL/PLATELET - Abnormal; Notable for the following components:      Result Value   RBC 4.15 (*)    Hemoglobin 11.4 (*)    HCT 32.8 (*)    MCV 79.0 (*)    All other components within normal limits  COMPREHENSIVE METABOLIC PANEL - Abnormal; Notable for the following components:   Potassium 5.8 (*)    Chloride 95 (*)    BUN 32 (*)    Creatinine, Ser 8.78 (*)    Albumin 3.4 (*)    GFR calc non Af Amer 6 (*)    GFR calc Af Amer 7 (*)    Anion gap 18 (*)    All other components within normal limits  BODY FLUID CULTURE  GRAM STAIN  CULTURE, BLOOD (ROUTINE X 2)  CULTURE, BLOOD (ROUTINE X 2)  SARS CORONAVIRUS 2 (TAT 6-24 HRS)  AMMONIA  PROTIME-INR  GLUCOSE, BODY FLUID OTHER  PROTEIN, BODY FLUID (OTHER)  SYNOVIAL CELL COUNT + DIFF, W/ CRYSTALS    EKG None  Radiology Dg Chest Port 1 View  Result Date: 05/14/2019 CLINICAL DATA:  Shortness of breath EXAM: PORTABLE CHEST 1 VIEW COMPARISON:  May 13, 2019 FINDINGS: The heart size is enlarged. There is vascular congestion without overt pulmonary edema. There is elevation of the left hemidiaphragm. Thoracic aortic calcifications are noted. There is no pneumothorax. IMPRESSION: Cardiomegaly with vascular congestion. Electronically Signed   By: Constance Holster M.D.   On: 05/14/2019 06:06   Dg Chest Portable 1 View  Result Date: 05/13/2019 CLINICAL DATA:  Shortness of breath. Increasing fluid in the abdomen per patient with 3 L of fluid removed last week. EXAM: PORTABLE CHEST 1 VIEW COMPARISON:  Chest radiograph and abdomen and pelvis CT dated 05/07/2019. FINDINGS: Stable markedly enlarged cardiac silhouette. Mild increase in diffuse prominence of the interstitial markings. No visible pleural fluid. The right lateral costophrenic angle and inferior left lateral costophrenic angle are not included. Extensive atheromatous arterial calcifications. Unremarkable bones. IMPRESSION: 1.  Stable marked cardiomegaly and pericardial effusion. 2. Mild changes of congestive heart failure superimposed on chronic interstitial lung disease. 3. Extensive atheromatous arterial calcifications. Electronically Signed   By: Claudie Revering M.D.   On: 05/13/2019 12:46    Procedures .Paracentesis  Date/Time: 05/14/2019 7:20 AM Performed by: Orpah Greek, MD Authorized by: Orpah Greek, MD   Consent:    Consent obtained:  Verbal   Consent given by:  Patient   Risks discussed:  Bleeding, bowel perforation, infection and pain Universal protocol:    Procedure explained and questions answered to patient or proxy's satisfaction: yes     Required blood products, implants, devices and special equipment available: yes     Site/side marked: yes     Immediately prior to procedure a time out was called: yes     Patient identity confirmed:  Hospital-assigned identification number Pre-procedure details:    Procedure purpose:  Diagnostic   Preparation: Patient was prepped and draped in usual sterile fashion   Anesthesia (see MAR for exact dosages):    Anesthesia method:  Local infiltration   Local anesthetic:  Lidocaine 1% w/o epi Procedure details:    Needle gauge:  18   Ultrasound guidance: yes     Puncture site:  L lower quadrant   Fluid appearance:  Cloudy and amber   Dressing:  Adhesive bandage (dermabond) Post-procedure details:    Patient tolerance of procedure:  Tolerated well, no immediate complications   (including critical care time)  Medications Ordered in ED Medications  diphenoxylate-atropine (LOMOTIL) 2.5-0.025 MG per tablet 2 tablet (has no administration in time range)  cefTRIAXone (ROCEPHIN) 2 g in sodium chloride 0.9 % 100 mL IVPB (has no administration in time range)  fentaNYL (SUBLIMAZE) injection 100 mcg (100 mcg Intravenous Given 05/14/19 0617)     Initial Impression / Assessment and Plan / ED Course  I have reviewed the triage vital signs and the  nursing notes.  Pertinent labs & imaging results that were available during my care of the patient were reviewed by me and considered in my medical decision making (see chart for details).        Patient presented to the emergency department for evaluation of abdominal pain.  Patient has a history of end-stage renal disease, due for dialysis this morning.  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  He has scheduled outpatient  paracentesis monthly.  He was seen in the ER several days ago with abdominal pain, was supposed to have outpatient paracentesis arranged by his GI doctor, Dr. Hilarie Fredrickson.  This has not happened yet.    Patient returns today with increasing pain.  There was a question of possible fever prehospital, but he is afebrile at arrival.  He has some distention and diffuse tenderness but the ascites does not appear to be tense at this time.  An ultrasound probe was used to look for fluid and he did have a moderate amount of fluid so paracentesis was performed.  1.75 L was obtained.  Fluid was slightly cloudy and amber in color.  He is still having pain and some tenderness after paracentesis, therefore SBP is considered.  Fluid sent for usual studies.  Will be given Rocephin.    Patient is mildly tachycardic.  He has a history of baseline atrial fibrillation, exhibiting mild RVR.  Patient does have a mildly elevated potassium at 5.8.  Chest x-ray does not show any significant volume overload.  He is due for dialysis today but cannot be discharged at this time because of the possible SBP.  Will require dialysis while in-house, will admit patient.  Patient initially stated that he did not want to be admitted but when I talked him about the severity of his findings he agrees to admission.  He understands if he leaves the hospital he could die.  Final Clinical Impressions(s) / ED Diagnoses   Final diagnoses:  SBP (spontaneous bacterial peritonitis) [facility])    ED Discharge  Orders    None       Riyanshi Wahab, Gwenyth Allegra, MD 05/14/19 [id number]    Orpah Greek, MD 05/14/19 [phone]

## 2019-05-14 NOTE — ED Provider Notes (Signed)
Patient seen and evaluated by the internal medicine team.  Patient has a strong preference for discharge, and internal medicine team is designated him as appropriate for this. Outpatient follow-up with GI facilitated with an appointment made this week.   Carmin Muskrat, MD 05/14/19 [phone]

## 2019-05-14 NOTE — Consult Note (Addendum)
Date: 05/14/2019               Patient Name:  [name] MRN: [id number]  DOB: 1960-11-07 Age / Sex: 58 y.o., male   PCP: [name], FNP         Requesting Physician: Dr. Betsey Holiday    Consulting Reason:  Ascites due to liver cirrhosis     Chief Complaint: Abdominal pain  History of Present Illness:  Mr. Mozingo is a 58 y.o male with esrd on hd mwf, paroxysmal atrial fibrillation, cirrhosis 2/2 prior etoh and chronic hepatitis B and C with recurrent ascites and history of SBP requiring LVP every 2 weeks, history of multiple colon polyps, cad, htn, COPD who presented with abdominal pain and distention along with dyspnea for the past few days. He mentioned that the abdominal pain is similar to how he feels when he usually has ascites.  Patient stated that he had some relief in abdominal pain after paracentesis but it seemed to have gotten slightly worse after he ate breakfast in the ED.  Patient was seen in the ER two other times this past week (10/26, 11/1).  On 10/26 the patient had chest x-ray and CT chest and abdomen done which showed large volume abdominal pelvic ascites and hepatic cirrhosis.  The patient was given antiemetic and pain medication and discharged home.    After discharge from the ED on 10/26 Dr. Hilarie Fredrickson  requested for large-volume paracentesis (6L) to be done after reviewing imaging results. However, only 2 L of clear yellow fluid was removed by IR on 10/27.  Patient's last HD session was Friday 10/30. He has history of non-adherence to dialysis. No growth was seen from fluid culture and Gram stain was also without any presence of organisms, there were mixed inflammatory cells seen on pathologic review.  Cell count showed 4% neutrophils, 260 total nucleated cell count.    Patient follows with [facility] Dr. Hilarie Fredrickson. He is being prescribed lactulose twice daily titrated to 2-3 bowel movements per day, Protonix 40 mg daily, and Bactrim double strength  daily for SBP prophylaxis, and HD for diuresis.  Patient is not a candidate for liver transplant due to poor adherence, severe mitral stenosis, elevated PA pressures.  Patient is currently in the process of being evaluated to determine if he is a candidate for TI PS procedure.  On 11/1 the patient was again evaluated in ED for abdominal pain which was deemed to be secondary to ascites.  ED provider reached out to gastroenterology who mentioned that they will follow up with the patient for outpatient paracentesis later in the week.  In the ED, the patient was afebrile, tachycardic in the 110s, tachypneic 18-30s, normotensive, saturating well on room air. Paracentesis was performed which withdrew 1.75 L that was cloudy and amber in color.  Fluid was sent off for further studies. He was given ceftriaxone.  Meds: Current Facility-Administered Medications  Medication Dose Route Frequency Provider Last Rate Last Dose   albumin human 25 % solution 50 g  50 g Intravenous UD Pyrtle, Lajuan Lines, MD       Current Outpatient Medications  Medication Sig Dispense Refill   albuterol (VENTOLIN HFA) 108 (90 Base) MCG/ACT inhaler Inhale 2 puffs into the lungs every 6 (six) hours as needed for wheezing or shortness of breath.     hydrALAZINE (APRESOLINE) 100 MG tablet Take 100 mg by mouth 3 (three) times daily.      hydrOXYzine (ATARAX/VISTARIL) 25 MG tablet  Take 25 mg by mouth 3 (three) times daily as needed for itching.      lactulose (CHRONULAC) 10 GM/15ML solution TAKE 45 MLS BY MOUTH 2 TIMES DAILY. 2700 mL 0   metoprolol tartrate (LOPRESSOR) 25 MG tablet Take 1 tablet (25 mg total) by mouth 2 (two) times daily. 60 tablet 0   montelukast (SINGULAIR) 10 MG tablet Take 10 mg by mouth every evening.      ondansetron (ZOFRAN ODT) 4 MG disintegrating tablet Take 1 tablet (4 mg total) by mouth every 8 (eight) hours as needed for nausea or vomiting. 10 tablet 1   ondansetron (ZOFRAN) 8 MG tablet Take 8 mg by mouth  every 8 (eight) hours as needed for nausea or vomiting.      pantoprazole (PROTONIX) 40 MG tablet Take 1 tablet (40 mg total) by mouth daily. (Patient taking differently: Take 40 mg by mouth every evening. ) 90 tablet 3   sevelamer carbonate (RENVELA) 800 MG tablet Take 4 tablets with meals  And 2 tablets twice daily with snacks (Patient taking differently: Take 1,600 mg by mouth See admin instructions. Take 2 tablets (1600mg ) with meals & with snacks)     sulfamethoxazole-trimethoprim (BACTRIM DS) 800-160 MG tablet Take 1 tablet by mouth daily. (Patient taking differently: Take 1 tablet by mouth 2 (two) times daily. ) 30 tablet 11   SYMBICORT 160-4.5 MCG/ACT inhaler Inhale 2 puffs into the lungs 2 (two) times daily.      zolpidem (AMBIEN) 10 MG tablet Take 10 mg by mouth at bedtime as needed for sleep.      Allergies: Allergies as of 05/14/2019 - Review Complete 05/14/2019  Allergen Reaction Noted   Morphine and related Other (See Comments) 12/23/2017   Aspirin Other (See Comments) 02/27/2014   Clonidine derivatives Itching 08/05/2013   Tramadol Itching 02/25/2011   Tylenol [acetaminophen] Nausea Only 10/27/2017   Past Medical History:  Diagnosis Date   Anemia    Anxiety    Arthritis    left shoulder   Atherosclerosis of aorta (HCC)    Cardiomegaly    Chest pain    DATE UNKNOWN, C/O PERIODICALLY   Cocaine abuse (HCC)    COPD exacerbation ([place]) 08/17/2016   Coronary artery disease    stent 02/22/17   ESRD (end stage renal disease) on dialysis ([place])    "[name]; MWF" (07/04/2017)   GERD (gastroesophageal reflux disease)    DATE UNKNOWN   Hemorrhoids    Hepatitis B, chronic (HCC)    Hepatitis C    History of kidney stones    Hyperkalemia    Hypertension    Kidney failure    Metabolic bone disease    Patient denies   Mitral stenosis    Myocardial infarction [facility])    Pneumonia    Pulmonary edema    Solitary rectal ulcer syndrome 07/2017     at flex sig for rectal bleeding   Tubular adenoma of colon    Past Surgical History:  Procedure Laterality Date   A/V FISTULAGRAM Left 05/26/2017   Procedure: A/V FISTULAGRAM;  Surgeon: Conrad Trout Lake, MD;  Location: [place] [facility];  Service: Cardiovascular;  Laterality: Left;   A/V FISTULAGRAM Right 11/18/2017   Procedure: A/V FISTULAGRAM - Right Arm;  Surgeon: Elam Dutch, MD;  Location: [place] [facility];  Service: Cardiovascular;  Laterality: Right;   APPLICATION OF WOUND VAC Left 06/14/2017   Procedure: APPLICATION OF WOUND VAC;  Surgeon: Katha Cabal, MD;  Location: [facility];  Service: Vascular;  Laterality: Left;   AV FISTULA PLACEMENT  2012   BELIEVED WAS PLACED IN JUNE   AV FISTULA PLACEMENT Right 08/09/2017   Procedure: Creation Right arm ARTERIOVENOUS BRACHIOCEPOHALIC FISTULA;  Surgeon: Elam Dutch, MD;  Location: [facility] OR;  Service: Vascular;  Laterality: Right;   AV FISTULA PLACEMENT Right 11/22/2017   Procedure: INSERTION OF ARTERIOVENOUS (AV) GORE-TEX GRAFT RIGHT UPPER ARM;  Surgeon: Elam Dutch, MD;  Location: [place];  Service: Vascular;  Laterality: Right;   BIOPSY  01/25/2018   Procedure: BIOPSY;  Surgeon: Jerene Bears, MD;  Location: [place];  Service: Gastroenterology;;   BIOPSY  04/10/2019   Procedure: BIOPSY;  Surgeon: Jerene Bears, MD;  Location: [facility];  Service: Gastroenterology;;   COLONOSCOPY     COLONOSCOPY WITH PROPOFOL N/A 01/25/2018   Procedure: COLONOSCOPY WITH PROPOFOL;  Surgeon: Jerene Bears, MD;  Location: [place];  Service: Gastroenterology;  Laterality: N/A;   CORONARY STENT INTERVENTION N/A 02/22/2017   Procedure: CORONARY STENT INTERVENTION;  Surgeon: Nigel Mormon, MD;  Location: [place] [facility];  Service: Cardiovascular;  Laterality: N/A;   ESOPHAGOGASTRODUODENOSCOPY (EGD) WITH PROPOFOL N/A 01/25/2018   Procedure: ESOPHAGOGASTRODUODENOSCOPY (EGD) WITH PROPOFOL;  Surgeon: Jerene Bears, MD;   Location: [place];  Service: Gastroenterology;  Laterality: N/A;   ESOPHAGOGASTRODUODENOSCOPY (EGD) WITH PROPOFOL N/A 04/10/2019   Procedure: ESOPHAGOGASTRODUODENOSCOPY (EGD) WITH PROPOFOL;  Surgeon: Jerene Bears, MD;  Location: [facility];  Service: Gastroenterology;  Laterality: N/A;   FLEXIBLE SIGMOIDOSCOPY N/A 07/15/2017   Procedure: FLEXIBLE SIGMOIDOSCOPY;  Surgeon: Carol Ada, MD;  Location: [place];  Service: Endoscopy;  Laterality: N/A;   HEMORRHOID BANDING     I&D EXTREMITY Left 06/01/2017   Procedure: IRRIGATION AND DEBRIDEMENT LEFT ARM HEMATOMA WITH LIGATION OF LEFT ARM AV FISTULA;  Surgeon: Elam Dutch, MD;  Location: [place];  Service: Vascular;  Laterality: Left;   I&D EXTREMITY Left 06/14/2017   Procedure: IRRIGATION AND DEBRIDEMENT EXTREMITY;  Surgeon: Katha Cabal, MD;  Location: [facility];  Service: Vascular;  Laterality: Left;   INSERTION OF DIALYSIS CATHETER  05/30/2017   INSERTION OF DIALYSIS CATHETER N/A 05/30/2017   Procedure: INSERTION OF DIALYSIS CATHETER;  Surgeon: Elam Dutch, MD;  Location: [place];  Service: Vascular;  Laterality: N/A;   IR PARACENTESIS  08/30/2017   IR PARACENTESIS  09/29/2017   IR PARACENTESIS  10/28/2017   IR PARACENTESIS  11/09/2017   IR PARACENTESIS  11/16/2017   IR PARACENTESIS  11/28/2017   IR PARACENTESIS  12/01/2017   IR PARACENTESIS  12/06/2017   IR PARACENTESIS  01/03/2018   IR PARACENTESIS  01/23/2018   IR PARACENTESIS  02/07/2018   IR PARACENTESIS  02/21/2018   IR PARACENTESIS  03/06/2018   IR PARACENTESIS  03/17/2018   IR PARACENTESIS  04/04/2018   IR PARACENTESIS  12/28/2018   IR PARACENTESIS  01/08/2019   IR PARACENTESIS  01/23/2019   IR PARACENTESIS  02/01/2019   IR PARACENTESIS  02/19/2019   IR PARACENTESIS  03/01/2019   IR PARACENTESIS  03/15/2019   IR PARACENTESIS  04/03/2019   IR PARACENTESIS  04/12/2019   IR PARACENTESIS  05/01/2019   IR PARACENTESIS  05/08/2019   IR RADIOLOGIST  EVAL & MGMT  02/14/2018   IR RADIOLOGIST EVAL & MGMT  02/22/2019   LEFT HEART CATH AND CORONARY ANGIOGRAPHY N/A 02/22/2017   Procedure: LEFT HEART CATH AND CORONARY ANGIOGRAPHY;  Surgeon: Nigel Mormon, MD;  Location: [place] [facility];  Service: Cardiovascular;  Laterality: N/A;   LIGATION OF ARTERIOVENOUS  FISTULA Left 02/09/8298   Procedure: Plication of Left Arm Arteriovenous Fistula;  Surgeon: Elam Dutch, MD;  Location: [facility] OR;  Service: Vascular;  Laterality: Left;   POLYPECTOMY     POLYPECTOMY  01/25/2018   Procedure: POLYPECTOMY;  Surgeon: Jerene Bears, MD;  Location: [place];  Service: Gastroenterology;;   REVISON OF ARTERIOVENOUS FISTULA Left 3/71/6967   Procedure: PLICATION OF DISTAL ANEURYSMAL SEGEMENT OF LEFT UPPER ARM ARTERIOVENOUS FISTULA;  Surgeon: Elam Dutch, MD;  Location: [place];  Service: Vascular;  Laterality: Left;   REVISON OF ARTERIOVENOUS FISTULA Left 8/93/8101   Procedure: Plication of Left Upper Arm Fistula ;  Surgeon: Waynetta Sandy, MD;  Location: [place];  Service: Vascular;  Laterality: Left;   SKIN GRAFT SPLIT THICKNESS LEG / FOOT Left    SKIN GRAFT SPLIT THICKNESS LEFT ARM DONOR SITE: LEFT ANTERIOR THIGH   SKIN SPLIT GRAFT Left 07/04/2017   Procedure: SKIN GRAFT SPLIT THICKNESS LEFT ARM DONOR SITE: LEFT ANTERIOR THIGH;  Surgeon: Elam Dutch, MD;  Location: [place];  Service: Vascular;  Laterality: Left;   THROMBECTOMY W/ EMBOLECTOMY Left 06/05/2017   Procedure: EXPLORATION OF LEFT ARM FOR BLEEDING; OVERSEWED PROXIMAL FISTULA;  Surgeon: Angelia Mould, MD;  Location: [facility] OR;  Service: Vascular;  Laterality: Left;   WOUND EXPLORATION Left 06/03/2017   Procedure: WOUND EXPLORATION WITH WOUND VAC APPLICATION TO LEFT ARM;  Surgeon: Angelia Mould, MD;  Location: [facility] OR;  Service: Vascular;  Laterality: Left;   Family History  Problem Relation Age of Onset   Heart disease Mother    Lung cancer Mother     Heart disease Father    Malignant hyperthermia Father    COPD Father    Throat cancer Sister    Esophageal cancer Sister    Hypertension Other    COPD Other    Colon cancer Neg Hx    Colon polyps Neg Hx    Rectal cancer Neg Hx    Stomach cancer Neg Hx    Social History   Socioeconomic History   Marital status: Single    Spouse name: Not on file   Number of children: 3   Years of education: 10   Highest education level: Not on file  Occupational History   Occupation: Unemployed  Scientist, product/process development strain: Not on file   Food insecurity    Worry: Not on file    Inability: Not on file   Transportation needs    Medical: Not on file    Non-medical: Not on file  Tobacco Use   Smoking status: Current Every Day Smoker    Packs/day: 0.50    Years: 43.00    Pack years: 21.50    Types: Cigarettes    Start date: 08/13/1973   Smokeless tobacco: Never Used   Tobacco comment: i dont know i just make   Substance and Sexual Activity   Alcohol use: Not Currently    Frequency: Never    Comment: quit drinking in 2017   Drug use: Yes    Types: Marijuana, Cocaine    Comment: reports using once every 3 months, 04-06-2019 was this    Sexual activity: Not on file  Lifestyle   Physical activity    Days per week: Not on file    Minutes per session: Not on file   Stress: Not on file  Relationships   Social Herbalist on phone: Not on file    Gets together: Not on file    Attends religious service: Not on file    Active member of club or organization: Not on file    Attends meetings of clubs or organizations: Not on file    Relationship status: Not on file   Intimate partner violence    Fear of current or ex partner: Not on file    Emotionally abused: Not on file    Physically abused: Not on file    Forced sexual activity: Not on file  Other Topics Concern   Not on file  Social History Narrative   Lives alone   Caffeine use:  Coffee-rare   Soda- daily      [facility] (03/10/17):   Originally from [facility]. Previously worked trimming trees. No pets currently. No bird or mold exposure.     Review of Systems: Pertinent items are noted in HPI.  The patient denies any dizziness, chest pain.    Physical Exam: Blood pressure 128/62, pulse (!) 120, temperature 97.7 F (36.5 C), temperature source Oral, resp. rate 14, SpO2 98 %.  Physical Exam  Constitutional: He is oriented to person, place, and time. He appears well-developed and well-nourished. No distress.  HENT:  Head: Normocephalic and atraumatic.  Eyes: Conjunctivae are normal.  Cardiovascular: Normal rate, regular rhythm and normal heart sounds.  No murmur heard. Respiratory: Effort normal and breath sounds normal. No respiratory distress. He has no wheezes.  GI: Soft. Bowel sounds are normal. He exhibits distension. He exhibits no mass. There is abdominal tenderness (on palpation of abdomen).  No fluid wave present  Musculoskeletal:        General: No edema.  Neurological: He is alert and oriented to person, place, and time.  Skin: He is not diaphoretic.  Psychiatric: He has a normal mood and affect. His behavior is normal. Judgment and thought content normal.    Lab results:  CBC: WBC 9.6, Hb 11.4, HCT 32.8, PLT 189 CMP: NA 136, K5.8, CR 8.78, AST 23, ALT 12, ALP 74 INR: 1.2  Ammonia: 29 COVID-19 negative Body fluid culture no organisms seen Blood cultures pending Peritoneal fluid cell count: WBC 58, neutrophils 3%, straw-colored, hazy appearance Peritoneal fluid studies pending  Imaging results:  Dg Chest Port 1 View  Result Date: 05/14/2019 CLINICAL DATA:  Shortness of breath EXAM: PORTABLE CHEST 1 VIEW COMPARISON:  May 13, 2019 FINDINGS: The heart size is enlarged. There is vascular congestion without overt pulmonary edema. There is elevation of the left hemidiaphragm. Thoracic aortic calcifications are noted. There is no  pneumothorax. IMPRESSION: Cardiomegaly with vascular congestion. Electronically Signed   By: Constance Holster M.D.   On: 05/14/2019 06:06   Dg Chest Portable 1 View  Result Date: 05/13/2019 CLINICAL DATA:  Shortness of breath. Increasing fluid in the abdomen per patient with 3 L of fluid removed last week. EXAM: PORTABLE CHEST 1 VIEW COMPARISON:  Chest radiograph and abdomen and pelvis CT dated 05/07/2019. FINDINGS: Stable markedly enlarged cardiac silhouette. Mild increase in diffuse prominence of the interstitial markings. No visible pleural fluid. The right lateral costophrenic angle and inferior left lateral costophrenic angle are not included. Extensive atheromatous arterial calcifications. Unremarkable bones. IMPRESSION: 1. Stable marked cardiomegaly and pericardial effusion. 2. Mild changes of congestive heart failure superimposed on chronic interstitial lung disease. 3. Extensive atheromatous arterial calcifications. Electronically Signed   By: Claudie Revering M.D.   On:  05/13/2019 12:46   Other results: EKG: Pending   Assessment, Plan, & Recommendations by Problem:  Mr. Ferraris is a 58 year old male with a history of decompensated cirrhosis, ESRD on HD, hypertension, CAD, COPD who presents with worsening abdominal pain and dyspnea.  Decompensated cirrhosis 2/2 prior alcohol use, chronic hep B and hep C Patient appears comfortable and only with abdominal pain when his stomach is palpated.  His liver enzymes, INR are within normal range.  There is low suspicion for SBP given that the patient is afebrile, without leukocytosis, no altered mentation, and peritoneal fluid cell count is 58.  I spoke with Tye Savoy of [facility] who reviewed the patient's chart and stated that she agrees that the patient is stable for outpatient management.  She obtained an appointment with the [facility] gastroenterology on 11/5 at 9:30 AM.  -Low-sodium diet -Pending peritoneal fluid  analysis -Continue pantoprazole 40 mg daily -Continue lactulose 30 mg twice daily -Follow-up with gastroenterology on 11/5 at 9:30 AM -Needs to be adherent to hd schedule   Paroxysmal atrial fibrillation Patient has mildly elevated heart rate in the 110s.  -Continue metoprolol 25 mg twice daily  ESRD on HD Monday Wednesday Friday The patient is due for hemodialysis session today.  His labs show creatinine 8.78, BUN 32, potassium 5.8.  Counseled patient to get hemodialysis session done today but he stated that he does not feel that he can tolerate it as he is already had fluid removed via paracentesis.  Inflammatory anemia The patient's hemoglobin is 11.4, HCT 32.8, MCV 79, RDW 14.5.  The patient's hemoglobin has chronically ranged 10-11.  She is to follow-up with nephrology outpatient  Hypertension Patient's blood pressure has ranged 101-133/85-107.  -Continue metoprolol 25 mg twice daily -Continue hydralazine 100 mg 3 times daily  CAD The patient's last left heart cath was done in August 2018 which showed 90% occlusion of RCA for which he had an angioplasty done with stent placement.  Last echo was done in February 2018 which showed LVEF 55-60%, hypokinesis of anterior septal and inferior septal myocardium, severely dilated left atrium, right atrium, right ventricle along with severe mitral stenosis.  COPD  -Continue Symbicort 2 puffs twice daily -Continue montelukast 10 mg every evening -Continue albuterol 2 puffs every 6 hours as needed   Signed: Lars Mage, MD 05/14/2019, 3:24 PM

## 2019-05-14 NOTE — ED Notes (Signed)
Pt refused blood culture draw

## 2019-05-14 NOTE — ED Notes (Signed)
Pt verbalized understanding of discharge instructions and follow up care. Pt wheeled to lobby. Alert and oriented X4

## 2019-05-14 NOTE — ED Triage Notes (Signed)
Pt from home c/o SOB. Dialysis patient who states "woke up with fluid on stomach". Previously seen for the same, where ems reports 3L was taken off.

## 2019-05-14 NOTE — Discharge Instructions (Signed)
As discussed, is very important you follow-up with your physician this week. You have an appointment on the fifth, at 130, with the gastroenterology team.

## 2019-05-14 NOTE — ED Notes (Signed)
Pt reporting diarrhea, ambulated to restroom

## 2019-05-15 LAB — PROTEIN, BODY FLUID (OTHER): Total Protein, Body Fluid Other: 3.4 g/dL

## 2019-05-15 LAB — GLUCOSE, BODY FLUID OTHER: Glucose, Body Fluid Other: 97 mg/dL

## 2019-05-16 DIAGNOSIS — D631 Anemia in chronic kidney disease: Secondary | ICD-10-CM | POA: Diagnosis not present

## 2019-05-16 DIAGNOSIS — N186 End stage renal disease: Secondary | ICD-10-CM | POA: Diagnosis not present

## 2019-05-16 DIAGNOSIS — Z992 Dependence on renal dialysis: Secondary | ICD-10-CM | POA: Diagnosis not present

## 2019-05-16 DIAGNOSIS — N2581 Secondary hyperparathyroidism of renal origin: Secondary | ICD-10-CM | POA: Diagnosis not present

## 2019-05-16 DIAGNOSIS — D509 Iron deficiency anemia, unspecified: Secondary | ICD-10-CM | POA: Diagnosis not present

## 2019-05-17 ENCOUNTER — Ambulatory Visit: Payer: Medicare Other | Admitting: Gastroenterology

## 2019-05-17 LAB — BODY FLUID CULTURE: Culture: NO GROWTH

## 2019-05-17 IMAGING — CR DG CHEST 2V
2 series · 2 of 2 positions shown · non-contrast
Comparison: None.

CLINICAL DATA: Abdominal distention with productive cough.

EXAM:
CHEST - 2 VIEW

[chest lat]
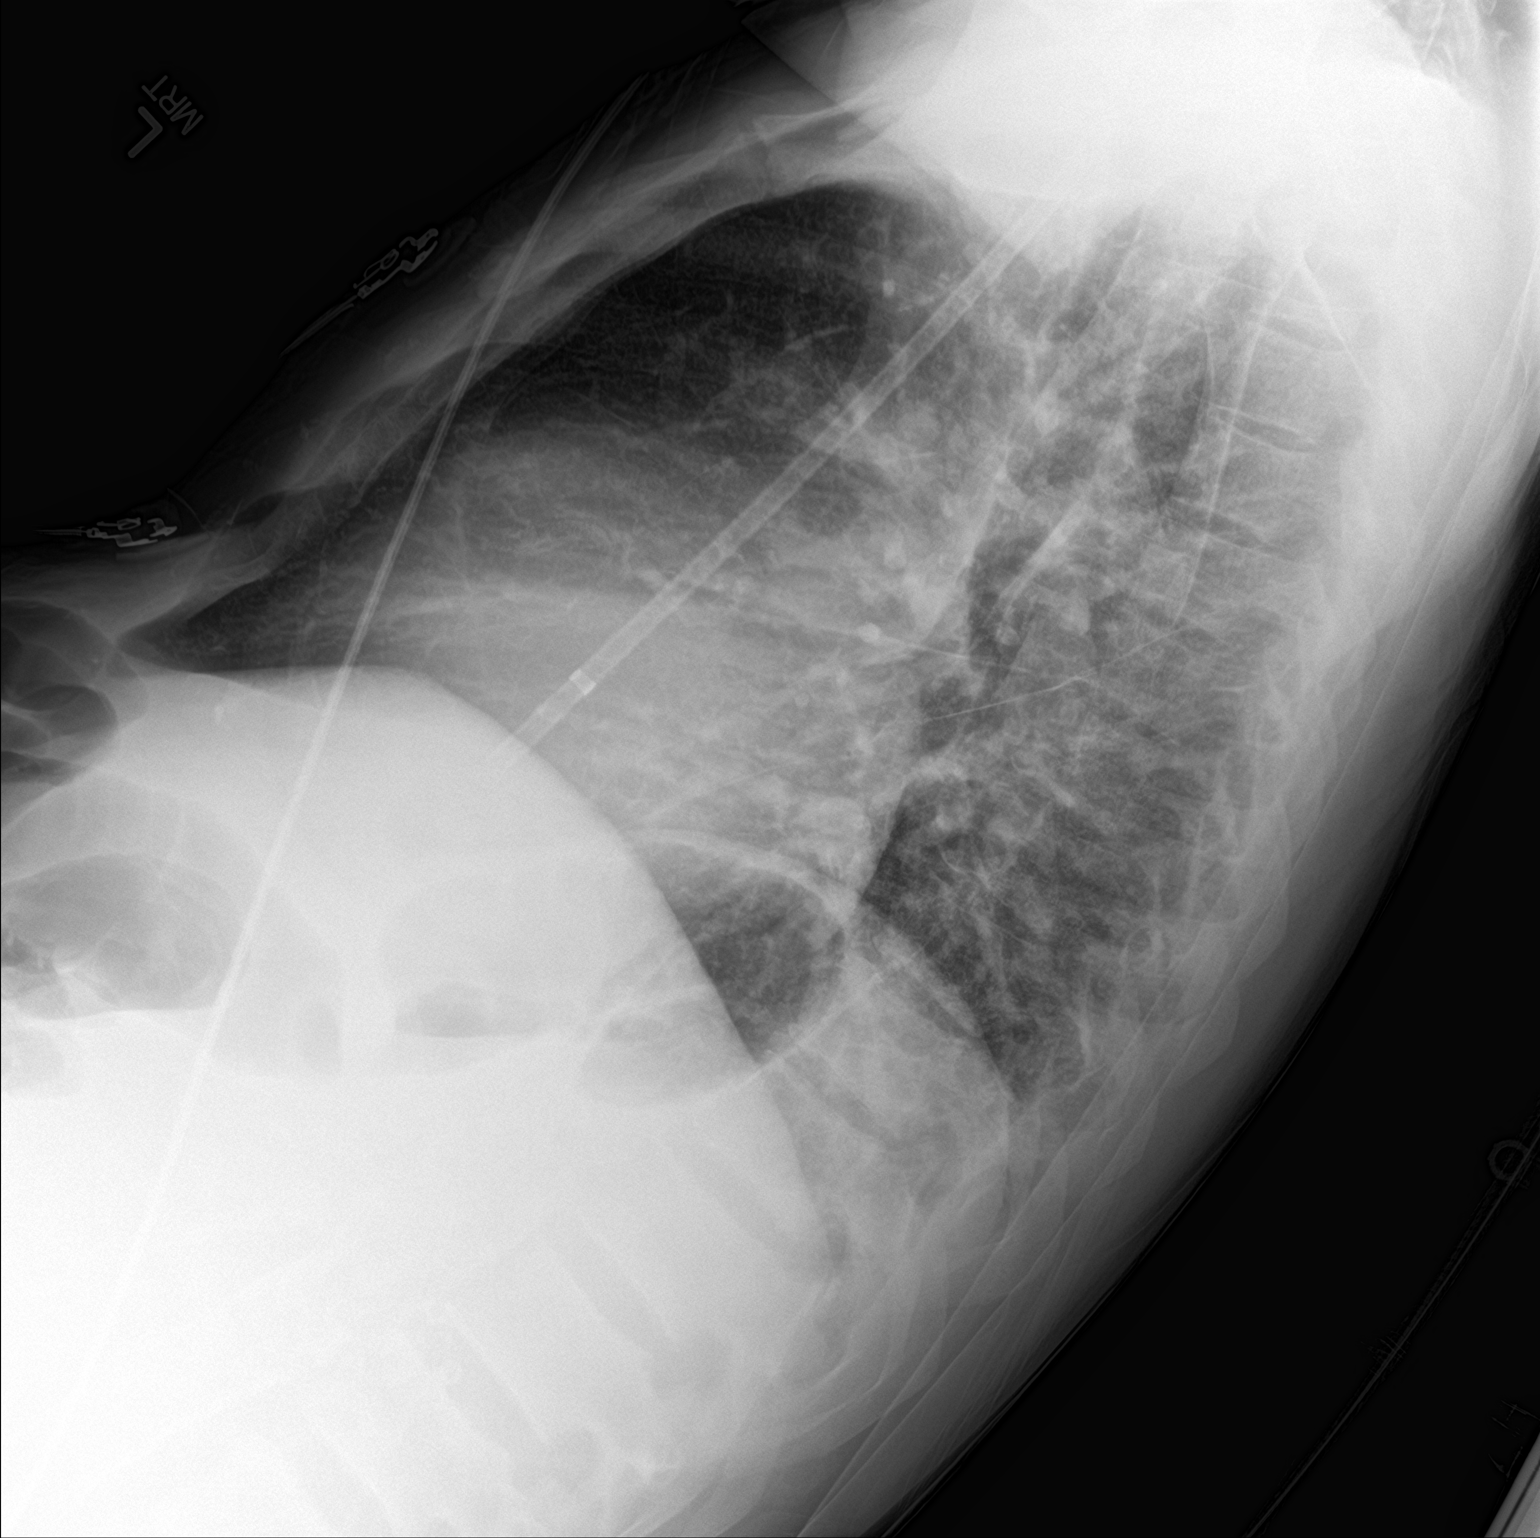

[chest ap]
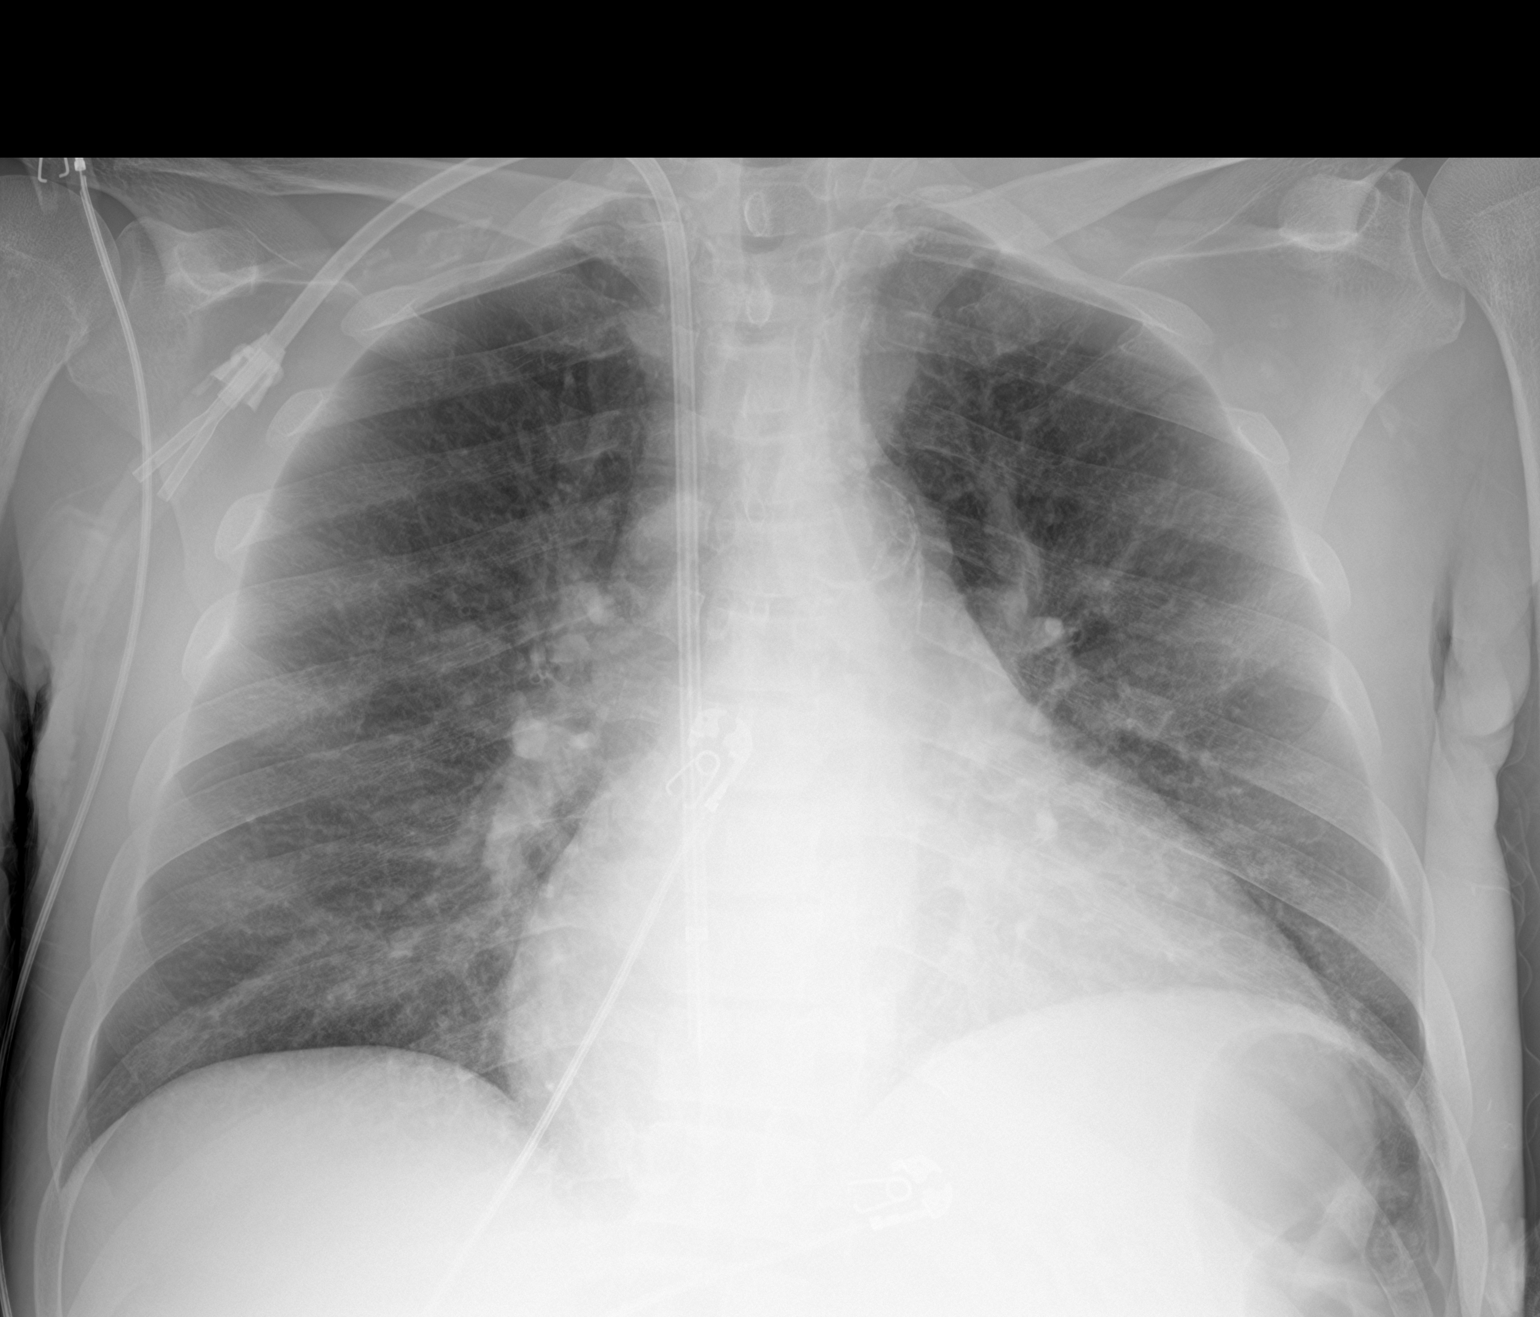

[2 of 2 positions shown; findings below may reference images not displayed]

FINDINGS: The heart is enlarged. Dialysis catheter tips project over the RIGHT
atrium. Mild vascular congestion without consolidation or edema. No
effusion or pneumothorax. No acute osseous findings.
IMPRESSION: Cardiomegaly. Mild vascular congestion. No frank edema or
consolidation.

## 2019-05-18 DIAGNOSIS — N2581 Secondary hyperparathyroidism of renal origin: Secondary | ICD-10-CM | POA: Diagnosis not present

## 2019-05-18 DIAGNOSIS — Z992 Dependence on renal dialysis: Secondary | ICD-10-CM | POA: Diagnosis not present

## 2019-05-18 DIAGNOSIS — D509 Iron deficiency anemia, unspecified: Secondary | ICD-10-CM | POA: Diagnosis not present

## 2019-05-18 DIAGNOSIS — N186 End stage renal disease: Secondary | ICD-10-CM | POA: Diagnosis not present

## 2019-05-18 DIAGNOSIS — D631 Anemia in chronic kidney disease: Secondary | ICD-10-CM | POA: Diagnosis not present

## 2019-05-18 IMAGING — US IR PARACENTESIS
1 series · 3 of 3 positions shown · non-contrast
Comparison: Previous paracentesis.

MEDICATIONS:
10 cc 2% lidocaine.

COMPLICATIONS:
None immediate.

INDICATION: Recurrent ascites

EXAM:
ULTRASOUND-GUIDED PARACENTESIS
TECHNIQUE: Informed written consent was obtained from the patient after a
discussion of the risks, benefits and alternatives to treatment. A
timeout was performed prior to the initiation of the procedure.

[Series 1: ir (id) (id)/(id)/(id) ir · 3 of 3 slices shown]
[im 1/3]
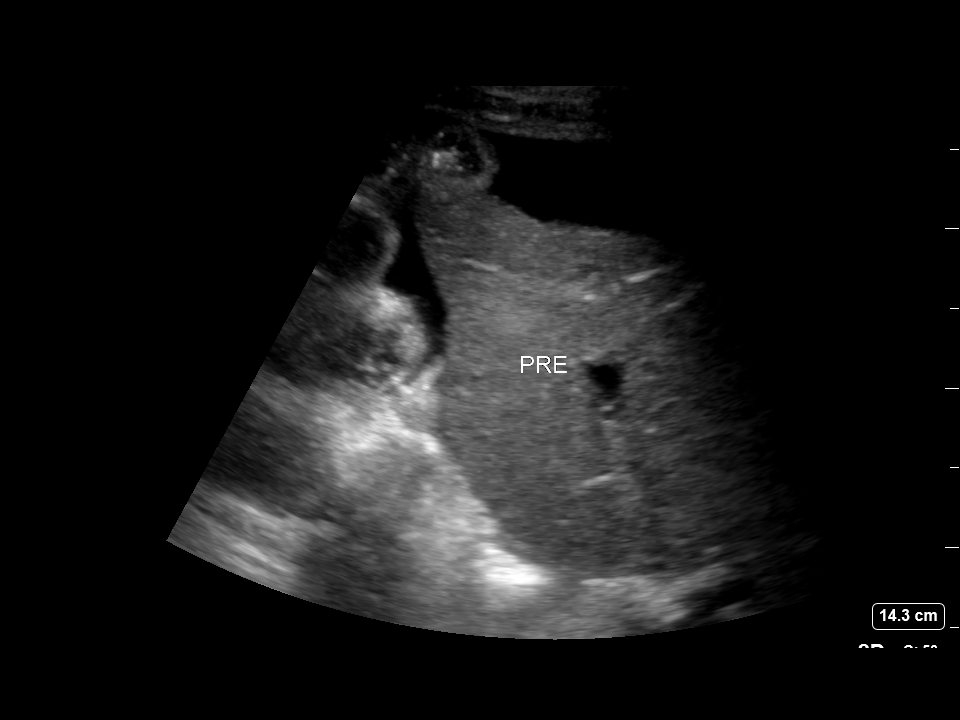
[im 2/3]
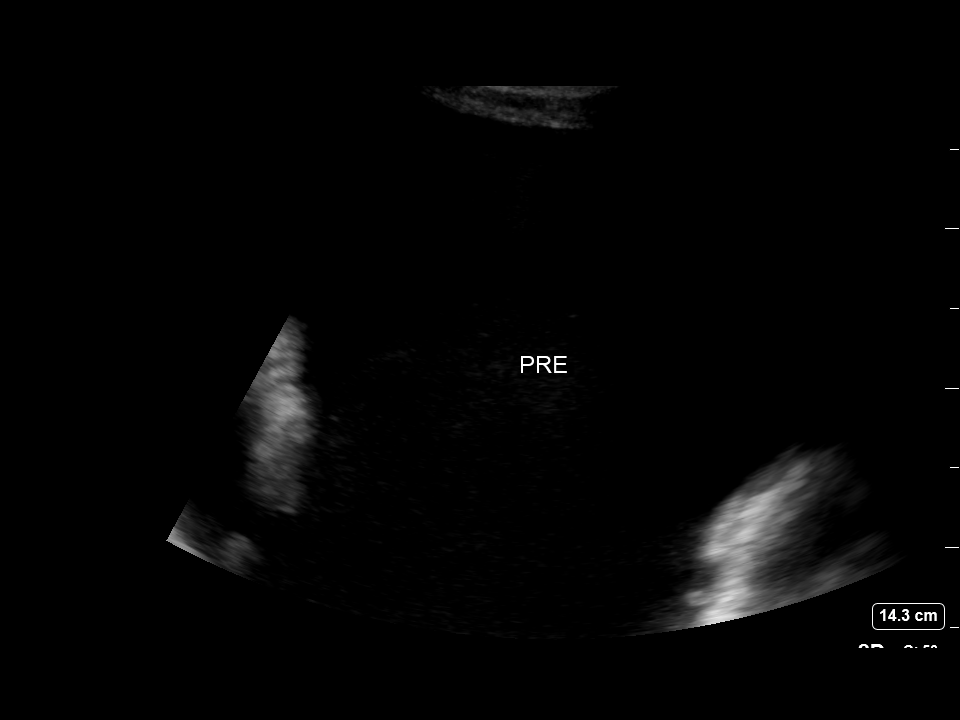
[im 3/3]
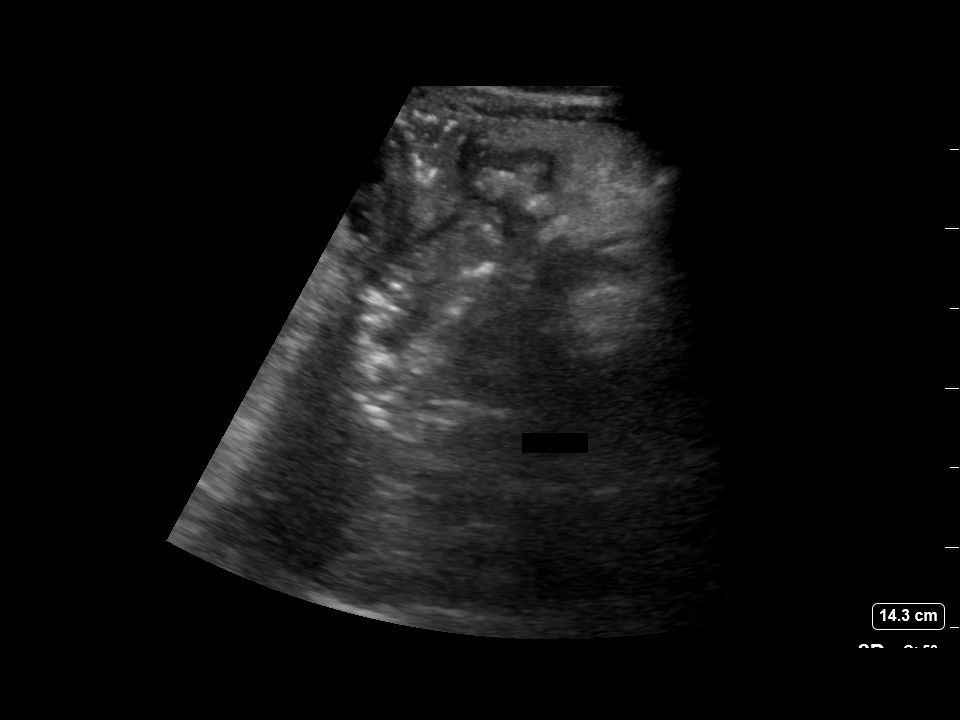

[3 of 3 positions shown; findings below may reference images not displayed]

Initial ultrasound scanning demonstrates a large amount of ascites
within the left lower abdominal quadrant. The right lower abdomen
was prepped and draped in the usual sterile fashion. 1% lidocaine
with epinephrine was used for local anesthesia. Under direct
ultrasound guidance, a 19 gauge, 7-cm, Yueh catheter was introduced.
An ultrasound image was saved for documentation purposed. The
paracentesis was performed. The catheter was removed and a dressing
was applied. The patient tolerated the procedure well without
immediate post procedural complication.
FINDINGS: A total of approximately 2.1 liters of yellow fluid was removed.
Samples were sent to the laboratory as requested by the clinical
team.
IMPRESSION: Successful ultrasound-guided paracentesis yielding 2.1 liters of
peritoneal fluid.

Read by

Lilibeth Bug

## 2019-05-20 ENCOUNTER — Emergency Department (HOSPITAL_COMMUNITY): Payer: Medicare Other

## 2019-05-20 ENCOUNTER — Emergency Department (HOSPITAL_COMMUNITY)
Admission: EM | Admit: 2019-05-20 | Discharge: 2019-05-20 | Disposition: A | Discharge status: Home / Self Care | Payer: Medicare Other | Attending: Emergency Medicine | Admitting: Emergency Medicine

## 2019-05-20 ENCOUNTER — Encounter (HOSPITAL_COMMUNITY): Payer: Self-pay

## 2019-05-20 ENCOUNTER — Other Ambulatory Visit: Payer: Self-pay

## 2019-05-20 DIAGNOSIS — J449 Chronic obstructive pulmonary disease, unspecified: Secondary | ICD-10-CM | POA: Diagnosis not present

## 2019-05-20 DIAGNOSIS — I4891 Unspecified atrial fibrillation: Secondary | ICD-10-CM | POA: Diagnosis not present

## 2019-05-20 DIAGNOSIS — I2510 Atherosclerotic heart disease of native coronary artery without angina pectoris: Secondary | ICD-10-CM | POA: Diagnosis not present

## 2019-05-20 DIAGNOSIS — N186 End stage renal disease: Secondary | ICD-10-CM

## 2019-05-20 DIAGNOSIS — I499 Cardiac arrhythmia, unspecified: Secondary | ICD-10-CM | POA: Diagnosis not present

## 2019-05-20 DIAGNOSIS — R0602 Shortness of breath: Secondary | ICD-10-CM | POA: Insufficient documentation

## 2019-05-20 DIAGNOSIS — F17210 Nicotine dependence, cigarettes, uncomplicated: Secondary | ICD-10-CM | POA: Diagnosis not present

## 2019-05-20 DIAGNOSIS — E875 Hyperkalemia: Secondary | ICD-10-CM | POA: Diagnosis not present

## 2019-05-20 DIAGNOSIS — Z79899 Other long term (current) drug therapy: Secondary | ICD-10-CM | POA: Diagnosis not present

## 2019-05-20 DIAGNOSIS — Z743 Need for continuous supervision: Secondary | ICD-10-CM | POA: Diagnosis not present

## 2019-05-20 DIAGNOSIS — Z992 Dependence on renal dialysis: Secondary | ICD-10-CM | POA: Insufficient documentation

## 2019-05-20 DIAGNOSIS — I120 Hypertensive chronic kidney disease with stage 5 chronic kidney disease or end stage renal disease: Secondary | ICD-10-CM | POA: Insufficient documentation

## 2019-05-20 DIAGNOSIS — R0902 Hypoxemia: Secondary | ICD-10-CM | POA: Diagnosis not present

## 2019-05-20 LAB — CBC WITH DIFFERENTIAL/PLATELET
Abs Immature Granulocytes: 0.05 10*3/uL (ref 0.00–0.07)
Basophils Absolute: 0.1 10*3/uL (ref 0.0–0.1)
Basophils Relative: 1 %
Eosinophils Absolute: 0.1 10*3/uL (ref 0.0–0.5)
Eosinophils Relative: 1 %
HCT: 31.5 % — ABNORMAL LOW (ref 39.0–52.0)
Hemoglobin: 11 g/dL — ABNORMAL LOW (ref 13.0–17.0)
Immature Granulocytes: 1 %
Lymphocytes Relative: 12 %
Lymphs Abs: 1.1 10*3/uL (ref 0.7–4.0)
MCH: 27.9 pg (ref 26.0–34.0)
MCHC: 34.9 g/dL (ref 30.0–36.0)
MCV: 79.9 fL — ABNORMAL LOW (ref 80.0–100.0)
Monocytes Absolute: 0.7 10*3/uL (ref 0.1–1.0)
Monocytes Relative: 7 %
Neutro Abs: 7.7 10*3/uL (ref 1.7–7.7)
Neutrophils Relative %: 78 %
Platelets: 190 10*3/uL (ref 150–400)
RBC: 3.94 MIL/uL — ABNORMAL LOW (ref 4.22–5.81)
RDW: 15.3 % (ref 11.5–15.5)
WBC: 9.8 10*3/uL (ref 4.0–10.5)
nRBC: 0 % (ref 0.0–0.2)

## 2019-05-20 LAB — BASIC METABOLIC PANEL
Anion gap: 18 — ABNORMAL HIGH (ref 5–15)
BUN: 37 mg/dL — ABNORMAL HIGH (ref 6–20)
CO2: 21 mmol/L — ABNORMAL LOW (ref 22–32)
Calcium: 9.6 mg/dL (ref 8.9–10.3)
Chloride: 96 mmol/L — ABNORMAL LOW (ref 98–111)
Creatinine, Ser: 7.36 mg/dL — ABNORMAL HIGH (ref 0.61–1.24)
GFR calc Af Amer: 9 mL/min — ABNORMAL LOW (ref >60)
GFR calc non Af Amer: 7 mL/min — ABNORMAL LOW (ref >60)
Glucose, Bld: 142 mg/dL — ABNORMAL HIGH (ref 70–99)
Potassium: 5.7 mmol/L — ABNORMAL HIGH (ref 3.5–5.1)
Sodium: 135 mmol/L (ref 135–145)

## 2019-05-20 MED ORDER — SODIUM ZIRCONIUM CYCLOSILICATE 5 G PO PACK
5.0000 g | PACK | Freq: Once | ORAL | Status: AC
  Administered 2019-05-20: 5 g via ORAL
  Filled 2019-05-20: qty 1

## 2019-05-20 MED ORDER — OXYCODONE HCL 5 MG PO TABS
5.0000 mg | ORAL_TABLET | Freq: Once | ORAL | Status: AC
  Administered 2019-05-20: 5 mg via ORAL
  Filled 2019-05-20: qty 1

## 2019-05-20 NOTE — ED Notes (Signed)
Pt ambulated in hall with pulse oximetry. SpO2 maintained between 92% and 96%. Pt complained of shortness of breath.

## 2019-05-20 NOTE — ED Notes (Signed)
Ordered house tray 

## 2019-05-20 NOTE — ED Provider Notes (Signed)
[facility] EMERGENCY DEPARTMENT Provider Note   CSN: [id number] Arrival date & time:        History   Chief Complaint Chief Complaint  Patient presents with  . Shortness of Breath    HPI [name] is a 58 y.o. male.     Patient with history of chronic kidney disease on hemodialysis Monday/Wednesday/Friday, cirrhosis of the liver with recurrent ascites last drained about a week ago, atrial fibrillation on Lopressor --presents to the emergency department with complaint of shortness of breath.  Patient has had worsening shortness of breath since this morning.  EMS was called once and patient refused transport.  Symptoms got worse and he called EMS again and was transported to the hospital.  States that he feels like his heart is racing.  No chest pain.  He has some mild abdominal pain which is chronic.  Denies any fevers or cough.  States that shortness of breath is worse with walking.  Patient states that he took his Lopressor today.  20 mg of Cardizem was given IV by EMS.  Heart rate was reported 80-160.  Rate well controlled upon arrival to the emergency department.     Past Medical History:  Diagnosis Date  . Anemia   . Anxiety   . Arthritis    left shoulder  . Atherosclerosis of aorta ([place])   . Cardiomegaly   . Chest pain    DATE UNKNOWN, C/O PERIODICALLY  . Cocaine abuse ([place])   . COPD exacerbation ([place]) 08/17/2016  . Coronary artery disease    stent 02/22/17  . ESRD (end stage renal disease) on dialysis ([place])    "[name]; MWF" (07/04/2017)  . GERD (gastroesophageal reflux disease)    DATE UNKNOWN  . Hemorrhoids   . Hepatitis B, chronic ([place])   . Hepatitis C   . History of kidney stones   . Hyperkalemia   . Hypertension   . Kidney failure   . Metabolic bone disease    Patient denies  . Mitral stenosis   . Myocardial infarction ([place])   . Pneumonia   . Pulmonary edema   . Solitary rectal ulcer syndrome 07/2017   at [facility]  for rectal bleeding  . Tubular adenoma of colon     Patient Active Problem List   Diagnosis Date Noted  . Melena   . Pressure injury of skin 03/09/2019  . Abdominal distention   . Volume overload 12/28/2018  . Sepsis ([place]) 09/12/2018  . Atherosclerosis of native coronary artery of native heart without angina pectoris 03/11/2018  . Benign neoplasm of cecum   . Benign neoplasm of ascending colon   . Benign neoplasm of descending colon   . Benign neoplasm of rectum   . Paroxysmal atrial fibrillation ([place]) 01/23/2018  . Hx of colonic polyps 01/20/2018  . ESRD (end stage renal disease) ([place]) 11/21/2017  . GERD (gastroesophageal reflux disease) 11/16/2017  . Liver cirrhosis ([place]) 11/15/2017  . DNR (do not resuscitate)   . Palliative care by specialist   . Hyponatremia 11/04/2017  . SBP (spontaneous bacterial peritonitis) ([place]) 10/30/2017  . Liver disease, chronic 10/30/2017  . SOB (shortness of breath)   . Abdominal pain 10/28/2017  . Upper airway cough syndrome with flattening on f/v loop 10/13/17 c/w vcd 10/17/2017  . Elevated diaphragm 10/13/2017  . Ileus ([place]) 09/29/2017  . Malnutrition of moderate degree 09/29/2017  . Sinus congestion 09/03/2017  . Symptomatic anemia 09/02/2017  . Other cirrhosis of liver (  [place]) 09/02/2017  . Left bundle branch block 09/02/2017  . Mitral stenosis 09/02/2017  . Hematochezia 07/15/2017  . Wide-complex tachycardia ([place])   . Endotracheally intubated   . ESRD on dialysis ([place]) 07/04/2017  . Acute respiratory failure with hypoxia ([place]) 06/18/2017  . CKD (chronic kidney disease) stage V requiring chronic dialysis ([place]) 06/18/2017  . History of Cocaine abuse ([place]) 06/18/2017  . Hypertension 06/18/2017  . Infection of AV graft for dialysis ([place]) 06/18/2017  . Anxiety 06/18/2017  . Anemia due to chronic kidney disease 06/18/2017  . Atrial flutter with rapid ventricular response ([place]) 06/18/2017  . Personality disorder ([place]) 06/13/2017  .  Cellulitis 06/12/2017  . Adjustment disorder with mixed anxiety and depressed mood 06/10/2017  . Suicidal ideation 06/10/2017  . Arm wound, left, sequela 06/10/2017  . Dyspnea on exertion 05/29/2017  . Tachycardia 05/29/2017  . Hyperkalemia 05/22/2017  . Acute metabolic encephalopathy   . Anemia 04/23/2017  . Ascites 04/23/2017  . COPD (chronic obstructive pulmonary disease) ([place]) 04/23/2017  . Acute on chronic respiratory failure with hypoxia ([place]) 03/25/2017  . Arrhythmia 03/25/2017  . COPD GOLD 0 with flattening on inps f/v  09/27/2016  . Essential hypertension 09/27/2016  . Fluid overload 08/30/2016  . COPD exacerbation ([place]) 08/17/2016  . Hypertensive urgency 08/17/2016  . Respiratory failure ([place]) 08/17/2016  . Problem with dialysis access ([place]) 07/23/2016  . Chronic hepatitis B ([place]) 03/05/2014  . Chronic hepatitis C without hepatic coma ([place]) 03/05/2014  . Internal hemorrhoids with bleeding, swelling and itching 03/05/2014  . Thrombocytopenia ([place]) 03/05/2014  . Chest pain 02/27/2014  . Alcohol abuse 04/14/2009  . Cigarette smoker 04/14/2009  . GANGLION CYST 04/14/2009    Past Surgical History:  Procedure Laterality Date  . A/V FISTULAGRAM Left 05/26/2017   Procedure: A/V FISTULAGRAM;  Surgeon: Conrad Flaxville, MD;  Location: [facility];  Service: Cardiovascular;  Laterality: Left;  . A/V FISTULAGRAM Right 11/18/2017   Procedure: A/V FISTULAGRAM - Right Arm;  Surgeon: Elam Dutch, MD;  Location: [facility];  Service: Cardiovascular;  Laterality: Right;  . APPLICATION OF WOUND VAC Left 06/14/2017   Procedure: APPLICATION OF WOUND VAC;  Surgeon: Katha Cabal, MD;  Location: [facility];  Service: Vascular;  Laterality: Left;  . AV FISTULA PLACEMENT  2012   BELIEVED WAS PLACED IN JUNE  . AV FISTULA PLACEMENT Right 08/09/2017   Procedure: Creation Right arm ARTERIOVENOUS BRACHIOCEPOHALIC FISTULA;  Surgeon: Elam Dutch, MD;  Location: [facility];   Service: Vascular;  Laterality: Right;  . AV FISTULA PLACEMENT Right 11/22/2017   Procedure: INSERTION OF ARTERIOVENOUS (AV) GORE-TEX GRAFT RIGHT UPPER ARM;  Surgeon: Elam Dutch, MD;  Location: [facility];  Service: Vascular;  Laterality: Right;  . BIOPSY  01/25/2018   Procedure: BIOPSY;  Surgeon: Jerene Bears, MD;  Location: [facility];  Service: Gastroenterology;;  . BIOPSY  04/10/2019   Procedure: BIOPSY;  Surgeon: Jerene Bears, MD;  Location: [facility];  Service: Gastroenterology;;  . COLONOSCOPY    . COLONOSCOPY WITH PROPOFOL N/A 01/25/2018   Procedure: COLONOSCOPY WITH PROPOFOL;  Surgeon: Jerene Bears, MD;  Location: [facility];  Service: Gastroenterology;  Laterality: N/A;  . CORONARY STENT INTERVENTION N/A 02/22/2017   Procedure: CORONARY STENT INTERVENTION;  Surgeon: Nigel Mormon, MD;  Location: [facility];  Service: Cardiovascular;  Laterality: N/A;  . ESOPHAGOGASTRODUODENOSCOPY (EGD) WITH PROPOFOL N/A 01/25/2018   Procedure: ESOPHAGOGASTRODUODENOSCOPY (EGD) WITH PROPOFOL;  Surgeon: Zenovia Jarred  M, MD;  Location: [facility];  Service: Gastroenterology;  Laterality: N/A;  . ESOPHAGOGASTRODUODENOSCOPY (EGD) WITH PROPOFOL N/A 04/10/2019   Procedure: ESOPHAGOGASTRODUODENOSCOPY (EGD) WITH PROPOFOL;  Surgeon: Jerene Bears, MD;  Location: [facility];  Service: Gastroenterology;  Laterality: N/A;  . FLEXIBLE SIGMOIDOSCOPY N/A 07/15/2017   Procedure: FLEXIBLE SIGMOIDOSCOPY;  Surgeon: Carol Ada, MD;  Location: [facility];  Service: Endoscopy;  Laterality: N/A;  . HEMORRHOID BANDING    . I&D EXTREMITY Left 06/01/2017   Procedure: IRRIGATION AND DEBRIDEMENT LEFT ARM HEMATOMA WITH LIGATION OF LEFT ARM AV FISTULA;  Surgeon: Elam Dutch, MD;  Location: [facility];  Service: Vascular;  Laterality: Left;  . I&D EXTREMITY Left 06/14/2017   Procedure: IRRIGATION AND DEBRIDEMENT EXTREMITY;  Surgeon: Katha Cabal, MD;  Location: [facility];  Service: Vascular;  Laterality:  Left;  . INSERTION OF DIALYSIS CATHETER  05/30/2017  . INSERTION OF DIALYSIS CATHETER N/A 05/30/2017   Procedure: INSERTION OF DIALYSIS CATHETER;  Surgeon: Elam Dutch, MD;  Location: [facility];  Service: Vascular;  Laterality: N/A;  . IR PARACENTESIS  08/30/2017  . IR PARACENTESIS  09/29/2017  . IR PARACENTESIS  10/28/2017  . IR PARACENTESIS  11/09/2017  . IR PARACENTESIS  11/16/2017  . IR PARACENTESIS  11/28/2017  . IR PARACENTESIS  12/01/2017  . IR PARACENTESIS  12/06/2017  . IR PARACENTESIS  01/03/2018  . IR PARACENTESIS  01/23/2018  . IR PARACENTESIS  02/07/2018  . IR PARACENTESIS  02/21/2018  . IR PARACENTESIS  03/06/2018  . IR PARACENTESIS  03/17/2018  . IR PARACENTESIS  04/04/2018  . IR PARACENTESIS  12/28/2018  . IR PARACENTESIS  01/08/2019  . IR PARACENTESIS  01/23/2019  . IR PARACENTESIS  02/01/2019  . IR PARACENTESIS  02/19/2019  . IR PARACENTESIS  03/01/2019  . IR PARACENTESIS  03/15/2019  . IR PARACENTESIS  04/03/2019  . IR PARACENTESIS  04/12/2019  . IR PARACENTESIS  05/01/2019  . IR PARACENTESIS  05/08/2019  . IR RADIOLOGIST EVAL & MGMT  02/14/2018  . IR RADIOLOGIST EVAL & MGMT  02/22/2019  . LEFT HEART CATH AND CORONARY ANGIOGRAPHY N/A 02/22/2017   Procedure: LEFT HEART CATH AND CORONARY ANGIOGRAPHY;  Surgeon: Nigel Mormon, MD;  Location: [facility];  Service: Cardiovascular;  Laterality: N/A;  . LIGATION OF ARTERIOVENOUS  FISTULA Left 10/13/345   Procedure: Plication of Left Arm Arteriovenous Fistula;  Surgeon: Elam Dutch, MD;  Location: [facility];  Service: Vascular;  Laterality: Left;  . POLYPECTOMY    . POLYPECTOMY  01/25/2018   Procedure: POLYPECTOMY;  Surgeon: Jerene Bears, MD;  Location: [facility];  Service: Gastroenterology;;  . REVISON OF ARTERIOVENOUS FISTULA Left 11/03/9561   Procedure: PLICATION OF DISTAL ANEURYSMAL SEGEMENT OF LEFT UPPER ARM ARTERIOVENOUS FISTULA;  Surgeon: Elam Dutch, MD;  Location: [facility];  Service: Vascular;  Laterality: Left;  .  REVISON OF ARTERIOVENOUS FISTULA Left 8/75/6433   Procedure: Plication of Left Upper Arm Fistula ;  Surgeon: Waynetta Sandy, MD;  Location: [facility];  Service: Vascular;  Laterality: Left;  . SKIN GRAFT SPLIT THICKNESS LEG / FOOT Left    SKIN GRAFT SPLIT THICKNESS LEFT ARM DONOR SITE: LEFT ANTERIOR THIGH  . SKIN SPLIT GRAFT Left 07/04/2017   Procedure: SKIN GRAFT SPLIT THICKNESS LEFT ARM DONOR SITE: LEFT ANTERIOR THIGH;  Surgeon: Elam Dutch, MD;  Location: [facility];  Service: Vascular;  Laterality: Left;  . THROMBECTOMY W/ EMBOLECTOMY Left 06/05/2017   Procedure: EXPLORATION OF  LEFT ARM FOR BLEEDING; OVERSEWED PROXIMAL FISTULA;  Surgeon: Angelia Mould, MD;  Location: [facility];  Service: Vascular;  Laterality: Left;  . WOUND EXPLORATION Left 06/03/2017   Procedure: WOUND EXPLORATION WITH WOUND VAC APPLICATION TO LEFT ARM;  Surgeon: Angelia Mould, MD;  Location: [facility];  Service: Vascular;  Laterality: Left;        Home Medications    Prior to Admission medications   Medication Sig Start Date End Date Taking? Authorizing Provider  albuterol (VENTOLIN HFA) 108 (90 Base) MCG/ACT inhaler Inhale 2 puffs into the lungs every 6 (six) hours as needed for wheezing or shortness of breath.    [provider]  hydrALAZINE (APRESOLINE) 100 MG tablet Take 100 mg by mouth 3 (three) times daily.  02/13/19   [provider]  hydrOXYzine (ATARAX/VISTARIL) 25 MG tablet Take 25 mg by mouth 3 (three) times daily as needed for itching.  02/14/19   [provider]  lactulose (CHRONULAC) 10 GM/15ML solution TAKE 45 MLS BY MOUTH 2 TIMES DAILY. 05/11/19   Pyrtle, Lajuan Lines, MD  metoprolol tartrate (LOPRESSOR) 25 MG tablet Take 1 tablet (25 mg total) by mouth 2 (two) times daily. 03/11/18   Geradine Girt, DO  montelukast (SINGULAIR) 10 MG tablet Take 10 mg by mouth every evening.     [provider]  ondansetron (ZOFRAN ODT) 4 MG disintegrating tablet Take 1 tablet  (4 mg total) by mouth every 8 (eight) hours as needed for nausea or vomiting. 05/07/19   Fredia Sorrow, MD  ondansetron (ZOFRAN) 8 MG tablet Take 8 mg by mouth every 8 (eight) hours as needed for nausea or vomiting.     [provider]  pantoprazole (PROTONIX) 40 MG tablet Take 1 tablet (40 mg total) by mouth daily. Patient taking differently: Take 40 mg by mouth every evening.  03/22/19   Zehr, Laban Emperor, PA-C  sevelamer carbonate (RENVELA) 800 MG tablet Take 4 tablets with meals  And 2 tablets twice daily with snacks Patient taking differently: Take 1,600 mg by mouth See admin instructions. Take 2 tablets (1600mg ) with meals & with snacks 01/22/18   Patrecia Pour, MD  sulfamethoxazole-trimethoprim (BACTRIM DS) 800-160 MG tablet Take 1 tablet by mouth daily. Patient taking differently: Take 1 tablet by mouth 2 (two) times daily.  12/30/18   Geradine Girt, DO  SYMBICORT 160-4.5 MCG/ACT inhaler Inhale 2 puffs into the lungs 2 (two) times daily.  11/30/18   [provider]  zolpidem (AMBIEN) 10 MG tablet Take 10 mg by mouth at bedtime as needed for sleep.    [provider]    Family History Family History  Problem Relation Age of Onset  . Heart disease Mother   . Lung cancer Mother   . Heart disease Father   . Malignant hyperthermia Father   . COPD Father   . Throat cancer Sister   . Esophageal cancer Sister   . Hypertension Other   . COPD Other   . Colon cancer Neg Hx   . Colon polyps Neg Hx   . Rectal cancer Neg Hx   . Stomach cancer Neg Hx     Social History Social History   Tobacco Use  . Smoking status: Current Every Day Smoker    Packs/day: 0.50    Years: 43.00    Pack years: 21.50    Types: Cigarettes    Start date: 08/13/1973  . Smokeless tobacco: Never Used  . Tobacco comment: i  dont know i just make   Substance Use Topics  . Alcohol use: Not Currently    Frequency: Never    Comment: quit drinking in 2017  . Drug use: Yes    Types:  Marijuana, Cocaine    Comment: reports using once every 3 months, 04-06-2019 was this      Allergies   Morphine and related, Aspirin, Clonidine derivatives, Tramadol, and Tylenol [acetaminophen]   Review of Systems Review of Systems  Constitutional: Negative for fever.  HENT: Negative for rhinorrhea and sore throat.   Eyes: Negative for redness.  Respiratory: Positive for shortness of breath. Negative for cough.   Cardiovascular: Positive for palpitations. Negative for chest pain.  Gastrointestinal: Positive for abdominal distention and abdominal pain. Negative for diarrhea, nausea and vomiting.  Genitourinary: Negative for dysuria.  Musculoskeletal: Negative for myalgias.  Skin: Negative for rash.  Neurological: Negative for headaches.     Physical Exam Updated Vital Signs BP 125/73 (BP Location: Left Arm)   Pulse (!) 58   Temp 98.3 F (36.8 C) (Oral)   Resp 18   Ht 5\' 10"  (1.778 m)   Wt 68 kg   BMI 21.52 kg/m   Physical Exam Vitals signs and nursing note reviewed.  Constitutional:      Appearance: He is well-developed.  HENT:     Head: Normocephalic and atraumatic.  Eyes:     General:        Right eye: No discharge.        Left eye: No discharge.     Conjunctiva/sclera: Conjunctivae normal.  Neck:     Musculoskeletal: Normal range of motion and neck supple.  Cardiovascular:     Rate and Rhythm: Normal rate. Rhythm irregular.     Heart sounds: Normal heart sounds.  Pulmonary:     Effort: Pulmonary effort is normal.     Breath sounds: Normal breath sounds. No decreased breath sounds, wheezing, rhonchi or rales.     Comments: Patient is talking in full sentences without any distress. Abdominal:     Palpations: Abdomen is soft.     Tenderness: There is no abdominal tenderness.     Comments: Mild to moderate abdominal distention without any significant tenderness.  No peritoneal signs.  Skin:    General: Skin is warm and dry.  Neurological:     Mental  Status: He is alert.      ED Treatments / Results  Labs (all labs ordered are listed, but only abnormal results are displayed) Labs Reviewed  CBC WITH DIFFERENTIAL/PLATELET - Abnormal; Notable for the following components:      Result Value   RBC 3.94 (*)    Hemoglobin 11.0 (*)    HCT 31.5 (*)    MCV 79.9 (*)    All other components within normal limits  BASIC METABOLIC PANEL - Abnormal; Notable for the following components:   Potassium 5.7 (*)    Chloride 96 (*)    CO2 21 (*)    Glucose, Bld 142 (*)    BUN 37 (*)    Creatinine, Ser 7.36 (*)    GFR calc non Af Amer 7 (*)    GFR calc Af Amer 9 (*)    Anion gap 18 (*)    All other components within normal limits    ED ECG REPORT   Date: 05/20/2019  Rate: 60  Rhythm: atrial fibrillation  QRS Axis: left  Intervals: QT prolonged  ST/T Wave abnormalities: nonspecific T wave changes  Conduction  [place] bundle branch block  Narrative Interpretation:   Old EKG Reviewed: unchanged  I have personally reviewed the EKG tracing and agree with the computerized printout as noted.  Radiology Dg Chest Port 1 View  Result Date: 05/20/2019 CLINICAL DATA:  Shortness of breath EXAM: PORTABLE CHEST 1 VIEW COMPARISON:  05/14/2019 FINDINGS: Cardiomegaly. Aortic atherosclerosis. Unchanged elevation of the left hemidiaphragm. The visualized skeletal structures are unremarkable. IMPRESSION: Cardiomegaly without acute abnormality of the lungs in AP portable projection. Electronically Signed   By: Eddie Candle M.D.   On: 05/20/2019 18:34    Procedures Procedures (including critical care time)  Medications Ordered in ED Medications  sodium zirconium cyclosilicate (LOKELMA) packet 5 g (has no administration in time range)  oxyCODONE (Oxy IR/ROXICODONE) immediate release tablet 5 mg (5 mg Oral Given 05/20/19 2030)     Initial Impression / Assessment and Plan / ED Course  I have reviewed the triage vital signs and the nursing  notes.  Pertinent labs & imaging results that were available during my care of the patient were reviewed by me and considered in my medical decision making (see chart for details).        Patient seen and examined.  Patient appears to be in no distress.  He is currently in atrial fibrillation that is rate controlled.  His main complaint currently is that he wants a hot meal because he has only had a bowl of cereal today.  He requests something to eat several times.  Multiple chronic medical conditions, appear reasonably compensated at this point.  Will check lab work, chest x-ray, ambulate to ensure no desaturation.  While evaluating the patient, he was removed from oxygen and maintained oxygen sats durations of 97-99%.  Vital signs reviewed and are as follows: BP 125/73 (BP Location: Left Arm)   Pulse (!) 58   Temp 98.3 F (36.8 C) (Oral)   Resp 18   Ht 5\' 10"  (1.778 m)   Wt 68 kg   BMI 21.52 kg/m   9:43 PM Mr. Bonner has eaten dinner and is resting comfortably in bed.  No respiratory distress.  Heart rate 90 on the monitor.  He has dialysis at 8:30 AM tomorrow morning.  Plan for discharge with follow-up with dialysis.  Will give 1 dose of Lokelma 5 g prior to discharge due to potassium of 5.7.  Patient was discharged home on previous ED visit with higher potassium.  Patient verbalizes understanding and is in agreement with the plan.  Encouraged return to the emergency department with worsening symptoms especially if he has worsening difficulty breathing or shortness of breath, chest pain, syncope or lightheadedness, development of any infectious symptoms.    Final Clinical Impressions(s) / ED Diagnoses   Final diagnoses:  Shortness of breath  Hyperkalemia  ESRD (end stage renal disease) ([place])   Patient with multiple comorbidities here with shortness of breath today.  This is not an unusual presentation for him.  No signs of infection on chest x-ray.  Lab work is reassuring.   Patient has ambulated without hypoxia.  End-stage renal disease: Patient does not appear to be particularly fluid overloaded.  Chest x-ray without edema.  Lower extremities are not swollen.  Potassium is 5.7 without any significant EKG changes.  Treated with 1 dose of Lokelma.  Patient is due for dialysis in less than 12 hours.  Liver cirrhosis with ascites: Patient appears mildly distended but abdomen is not tight.  No significant tenderness.  No fevers, elevated white count,  or presentation suggestive of spontaneous bacterial peritonitis.  This may be contributing in part to his shortness of breath, however I do not feel that it is to the point where he needs admission for paracentesis tonight.  He can have this arranged as an outpatient as needed.  Atrial fibrillation with RVR: Rate controlled in the emergency department.  Again no signs of overt failure.  Patient was given IV Cardizem in route.  He will continue his Lopressor at home.  Do not suspect ACS, PE, coronavirus.  Patient does not have significant chest pain.  Mild anemia at baseline.  I feel that patient is stable for discharge to home for continued treatment of his multiple chronic medical problems which seem to be reasonably compensated tonight.  ED Discharge Orders    None       Carlisle Cater, Hershal Coria 05/20/19 [id number]    Sherwood Gambler, MD 05/20/19 [phone]

## 2019-05-20 NOTE — Discharge Instructions (Signed)
Please read and follow all provided instructions.  Your diagnoses today include:  1. Shortness of breath   2. Hyperkalemia   3. ESRD (end stage renal disease) ([place])     Tests performed today include:  Blood counts and electrolytes - slightly high potassium  EKG - shows afib  Chest x-ray -no pneumonia or excess fluid buildup  Vital signs. See below for your results today.   Medications prescribed:   None  Take any prescribed medications only as directed.  Home care instructions:  Follow any educational materials contained in this packet.  BE VERY CAREFUL not to take multiple medicines containing Tylenol (also called acetaminophen). Doing so can lead to an overdose which can damage your liver and cause liver failure and possibly death.   Follow-up instructions: Please follow-up with dialysis tomorrow morning for recheck of your symptoms.  Return instructions:   Please return to the Emergency Department if you experience worsening symptoms.   Return with worsening shortness of breath, trouble breathing, lightheadedness, passing out, chest pain.  Please return if you have any other emergent concerns.  Additional Information:  Your vital signs today were: BP 111/75    Pulse 97    Temp 98.3 F (36.8 C) (Oral)    Resp (!) 25    Ht 5\' 10"  (1.778 m)    Wt 68 kg    SpO2 97%    BMI 21.52 kg/m  If your blood pressure (BP) was elevated above 135/85 this visit, please have this repeated by your doctor within one month. --------------

## 2019-05-20 NOTE — ED Notes (Signed)
Woke up with sob and weakness.Called EMS was in  Afib with rvr , refused transport to hospital, called ems again with sob, weakness. Took lopressor  afib rate 80-160 per ems.   18 LFA 20 Cardizem Iv given by ems  Mon, wed, fri hd pt. Complainant

## 2019-05-21 DIAGNOSIS — D509 Iron deficiency anemia, unspecified: Secondary | ICD-10-CM | POA: Diagnosis not present

## 2019-05-21 DIAGNOSIS — Z992 Dependence on renal dialysis: Secondary | ICD-10-CM | POA: Diagnosis not present

## 2019-05-21 DIAGNOSIS — N2581 Secondary hyperparathyroidism of renal origin: Secondary | ICD-10-CM | POA: Diagnosis not present

## 2019-05-21 DIAGNOSIS — D631 Anemia in chronic kidney disease: Secondary | ICD-10-CM | POA: Diagnosis not present

## 2019-05-21 DIAGNOSIS — N186 End stage renal disease: Secondary | ICD-10-CM | POA: Diagnosis not present

## 2019-05-22 ENCOUNTER — Telehealth: Payer: Self-pay | Admitting: Internal Medicine

## 2019-05-22 ENCOUNTER — Other Ambulatory Visit: Payer: Self-pay

## 2019-05-22 DIAGNOSIS — R188 Other ascites: Secondary | ICD-10-CM

## 2019-05-22 DIAGNOSIS — K7460 Unspecified cirrhosis of liver: Secondary | ICD-10-CM

## 2019-05-22 NOTE — Telephone Encounter (Signed)
Pt scheduled for IR Para at [facility] 05/24/19@2pm , pt to arrive there at 1:45pm. Ordered para as order below. Pt aware of appt.

## 2019-05-22 NOTE — Telephone Encounter (Signed)
[facility] for repeat LVP, does not need iv albumin Cell count please

## 2019-05-22 NOTE — Telephone Encounter (Signed)
Pt would like to schedule a paracentesis.  °

## 2019-05-22 NOTE — Telephone Encounter (Signed)
Pt calling requesting paracentesis, last one done 05/14/19. Pt was seen in the ER again for SOB on 05/20/19, please advise.

## 2019-05-23 DIAGNOSIS — Z992 Dependence on renal dialysis: Secondary | ICD-10-CM | POA: Diagnosis not present

## 2019-05-23 DIAGNOSIS — D631 Anemia in chronic kidney disease: Secondary | ICD-10-CM | POA: Diagnosis not present

## 2019-05-23 DIAGNOSIS — N2581 Secondary hyperparathyroidism of renal origin: Secondary | ICD-10-CM | POA: Diagnosis not present

## 2019-05-23 DIAGNOSIS — N186 End stage renal disease: Secondary | ICD-10-CM | POA: Diagnosis not present

## 2019-05-23 DIAGNOSIS — D509 Iron deficiency anemia, unspecified: Secondary | ICD-10-CM | POA: Diagnosis not present

## 2019-05-24 ENCOUNTER — Other Ambulatory Visit: Payer: Self-pay

## 2019-05-24 ENCOUNTER — Other Ambulatory Visit: Payer: Medicare Other | Admitting: Hospice

## 2019-05-24 ENCOUNTER — Ambulatory Visit (HOSPITAL_COMMUNITY)
Admission: RE | Admit: 2019-05-24 | Discharge: 2019-05-24 | Disposition: A | Discharge status: Home / Self Care | Payer: Medicare Other | Source: Ambulatory Visit | Attending: Emergency Medicine | Admitting: Emergency Medicine

## 2019-05-24 ENCOUNTER — Encounter (HOSPITAL_COMMUNITY): Payer: Self-pay | Admitting: Interventional Radiology

## 2019-05-24 DIAGNOSIS — R188 Other ascites: Secondary | ICD-10-CM | POA: Diagnosis not present

## 2019-05-24 DIAGNOSIS — Z515 Encounter for palliative care: Secondary | ICD-10-CM

## 2019-05-24 DIAGNOSIS — N186 End stage renal disease: Secondary | ICD-10-CM

## 2019-05-24 HISTORY — PX: IR PARACENTESIS: IMG2679

## 2019-05-24 MED ORDER — LIDOCAINE HCL 1 % IJ SOLN
INTRAMUSCULAR | Status: AC
  Filled 2019-05-24: qty 20

## 2019-05-24 MED ORDER — LIDOCAINE HCL (PF) 1 % IJ SOLN
INTRAMUSCULAR | Status: DC | PRN
  Administered 2019-05-24: 10 mL

## 2019-05-24 NOTE — Progress Notes (Signed)
[facility] Consult Note Telephone: [phone]  Fax: [phone]  PATIENT NAME: [name] DOB: 30-Aug-1960 MRN: [id number]  PRIMARY CARE PROVIDER:   [name], FNP  REFERRING PROVIDER:  [name], [place],  [place] [id number]  RESPONSIBLE PARTY:  Self [phone]   TELEHEALTH VISIT STATEMENT Due to the COVID-19 crisis, this visit was done via telephone from my office. It was initiated and consented to by this patient and/or family.  RECOMMENDATIONS/PLAN:   Advance Care Planning/Goals of Care: Telehealth Visit consisted of building trust and discussions on Palliative Medicine as specialized medical care for people living with serious illness, aimed at facilitating advance care plan, symptoms relief and establishing goals of care. Goals of care include to maximize quality of life and symptom management. Patient not ready to further discuss advance care directive because he said he has an appointment to go to the hospital for paracentesis.  Symptom management: 2. ESRD on HD. Encouraged compliance since it is a symptom management tool to help minimize shortness of breath and maximize quality of life. He agreed that when he has fluid build up it causes him to have shortness of breath.With multiple health issues including cirrhosis of the liver with ascites he verbalized understanding on the need to stay for his full sessions of dialysis and go for paracentesis as planned. Follow up: Palliative care will continue to follow patient for goals of care clarification and symptom management I spent 20 minutes providing this consultation from  1pm to 1.20pm. More than 50% of the time in this consultation was spent on coordinating communication   HISTORY OF PRESENT ILLNESS:Joshua Chrissie Noa Hoskinsis a 58 y.o.year oldmalewith multiple medical problems including ESRD on HD, cirrhosis of the liver, Hep C, CAD,  pulmonary HTN, CHF, COPD. Palliative Care was asked to help address goals of care.  CODE STATUS: DNR  PPS: 60% HOSPICE ELIGIBILITY/DIAGNOSIS: TBD  PAST MEDICAL HISTORY:  Past Medical History:  Diagnosis Date  . Anemia   . Anxiety   . Arthritis    left shoulder  . Atherosclerosis of aorta ([facility])   . Cardiomegaly   . Chest pain    DATE UNKNOWN, C/O PERIODICALLY  . Cocaine abuse ([place])   . COPD exacerbation ([facility]) 08/17/2016  . Coronary artery disease    stent 02/22/17  . ESRD (end stage renal disease) on dialysis ([facility])    "[name]; MWF" (07/04/2017)  . GERD (gastroesophageal reflux disease)    DATE UNKNOWN  . Hemorrhoids   . Hepatitis B, chronic ([facility])   . Hepatitis C   . History of kidney stones   . Hyperkalemia   . Hypertension   . Kidney failure   . Metabolic bone disease    Patient denies  . Mitral stenosis   . Myocardial infarction ([facility])   . Pneumonia   . Pulmonary edema   . Solitary rectal ulcer syndrome 07/2017   at [facility] for rectal bleeding  . Tubular adenoma of colon     SOCIAL HX:  Social History   Tobacco Use  . Smoking status: Current Every Day Smoker    Packs/day: 0.50    Years: 43.00    Pack years: 21.50    Types: Cigarettes    Start date: 08/13/1973  . Smokeless tobacco: Never Used  . Tobacco comment: i dont know i just make   Substance Use Topics  . Alcohol use: Not Currently    Frequency:  Never    Comment: quit drinking in 2017    ALLERGIES:  Allergies  Allergen Reactions  . Morphine And Related Other (See Comments)    Stomach pain  . Aspirin Other (See Comments)    STOMACH PAIN  . Clonidine Derivatives Itching  . Tramadol Itching  . Tylenol [Acetaminophen] Nausea Only    Stomach ache     PERTINENT MEDICATIONS:  Outpatient Encounter Medications as of 05/24/2019  Medication Sig  . albuterol (VENTOLIN HFA) 108 (90 Base) MCG/ACT inhaler Inhale 2 puffs into the lungs every 6 (six) hours as needed for wheezing or shortness of breath.   . hydrALAZINE (APRESOLINE) 100 MG tablet Take 100 mg by mouth 3 (three) times daily.   Marland Kitchen lactulose (CHRONULAC) 10 GM/15ML solution TAKE 45 MLS BY MOUTH 2 TIMES DAILY. (Patient not taking: Reported on 05/20/2019)  . metoprolol tartrate (LOPRESSOR) 25 MG tablet Take 1 tablet (25 mg total) by mouth 2 (two) times daily.  . montelukast (SINGULAIR) 10 MG tablet Take 10 mg by mouth every evening.   . ondansetron (ZOFRAN ODT) 4 MG disintegrating tablet Take 1 tablet (4 mg total) by mouth every 8 (eight) hours as needed for nausea or vomiting.  Marland Kitchen oxyCODONE (OXY IR/ROXICODONE) 5 MG immediate release tablet Take 2.5 mg by mouth 2 (two) times daily as needed for moderate pain.   . pantoprazole (PROTONIX) 40 MG tablet Take 1 tablet (40 mg total) by mouth daily. (Patient taking differently: Take 40 mg by mouth every evening. )  . sevelamer carbonate (RENVELA) 800 MG tablet Take 4 tablets with meals  And 2 tablets twice daily with snacks (Patient taking differently: Take 1,600 mg by mouth See admin instructions. Take 2 tablets (1600mg ) with meals & with snacks)  . sulfamethoxazole-trimethoprim (BACTRIM DS) 800-160 MG tablet Take 1 tablet by mouth daily. (Patient taking differently: Take 1 tablet by mouth 2 (two) times daily. )  . SYMBICORT 160-4.5 MCG/ACT inhaler Inhale 2 puffs into the lungs 2 (two) times daily.   Marland Kitchen zolpidem (AMBIEN) 10 MG tablet Take 10 mg by mouth at bedtime as needed for sleep.   Facility-Administered Encounter Medications as of 05/24/2019  Medication  . albumin human 25 % solution 50 g  . lidocaine (PF) (XYLOCAINE) 1 % injection  . lidocaine (XYLOCAINE) 1 % (with pres) injection     Teodoro Spray, NP

## 2019-05-25 ENCOUNTER — Other Ambulatory Visit: Payer: Self-pay | Admitting: Internal Medicine

## 2019-05-27 ENCOUNTER — Encounter (HOSPITAL_COMMUNITY): Payer: Self-pay | Admitting: Emergency Medicine

## 2019-05-27 ENCOUNTER — Observation Stay (HOSPITAL_BASED_OUTPATIENT_CLINIC_OR_DEPARTMENT_OTHER)
Admission: EM | Admit: 2019-05-27 | Discharge: 2019-05-28 | Disposition: A | Discharge status: Home / Self Care | Payer: Medicare Other | Source: Home / Self Care | Attending: Emergency Medicine | Admitting: Emergency Medicine

## 2019-05-27 ENCOUNTER — Encounter (HOSPITAL_COMMUNITY): Payer: Self-pay

## 2019-05-27 ENCOUNTER — Emergency Department (HOSPITAL_COMMUNITY): Payer: Medicare Other

## 2019-05-27 ENCOUNTER — Emergency Department (HOSPITAL_COMMUNITY)
Admission: EM | Admit: 2019-05-27 | Discharge: 2019-05-27 | Discharge status: Against Medical Advice | Payer: Medicare Other | Source: Home / Self Care | Attending: Emergency Medicine | Admitting: Emergency Medicine

## 2019-05-27 ENCOUNTER — Other Ambulatory Visit: Payer: Self-pay

## 2019-05-27 DIAGNOSIS — D631 Anemia in chronic kidney disease: Secondary | ICD-10-CM | POA: Diagnosis present

## 2019-05-27 DIAGNOSIS — N2581 Secondary hyperparathyroidism of renal origin: Secondary | ICD-10-CM | POA: Insufficient documentation

## 2019-05-27 DIAGNOSIS — Z79899 Other long term (current) drug therapy: Secondary | ICD-10-CM | POA: Insufficient documentation

## 2019-05-27 DIAGNOSIS — K7290 Hepatic failure, unspecified without coma: Secondary | ICD-10-CM | POA: Diagnosis present

## 2019-05-27 DIAGNOSIS — I480 Paroxysmal atrial fibrillation: Secondary | ICD-10-CM | POA: Insufficient documentation

## 2019-05-27 DIAGNOSIS — Z885 Allergy status to narcotic agent status: Secondary | ICD-10-CM | POA: Insufficient documentation

## 2019-05-27 DIAGNOSIS — Z992 Dependence on renal dialysis: Secondary | ICD-10-CM

## 2019-05-27 DIAGNOSIS — I252 Old myocardial infarction: Secondary | ICD-10-CM | POA: Insufficient documentation

## 2019-05-27 DIAGNOSIS — Z9119 Patient's noncompliance with other medical treatment and regimen: Secondary | ICD-10-CM | POA: Insufficient documentation

## 2019-05-27 DIAGNOSIS — R0602 Shortness of breath: Secondary | ICD-10-CM | POA: Diagnosis not present

## 2019-05-27 DIAGNOSIS — K7460 Unspecified cirrhosis of liver: Secondary | ICD-10-CM | POA: Diagnosis present

## 2019-05-27 DIAGNOSIS — Z03818 Encounter for observation for suspected exposure to other biological agents ruled out: Secondary | ICD-10-CM | POA: Diagnosis not present

## 2019-05-27 DIAGNOSIS — Z7951 Long term (current) use of inhaled steroids: Secondary | ICD-10-CM | POA: Insufficient documentation

## 2019-05-27 DIAGNOSIS — R002 Palpitations: Secondary | ICD-10-CM | POA: Diagnosis not present

## 2019-05-27 DIAGNOSIS — N189 Chronic kidney disease, unspecified: Secondary | ICD-10-CM | POA: Diagnosis present

## 2019-05-27 DIAGNOSIS — Z20828 Contact with and (suspected) exposure to other viral communicable diseases: Secondary | ICD-10-CM | POA: Insufficient documentation

## 2019-05-27 DIAGNOSIS — G8929 Other chronic pain: Secondary | ICD-10-CM | POA: Insufficient documentation

## 2019-05-27 DIAGNOSIS — J9811 Atelectasis: Secondary | ICD-10-CM | POA: Diagnosis not present

## 2019-05-27 DIAGNOSIS — N186 End stage renal disease: Secondary | ICD-10-CM | POA: Insufficient documentation

## 2019-05-27 DIAGNOSIS — R188 Other ascites: Secondary | ICD-10-CM | POA: Insufficient documentation

## 2019-05-27 DIAGNOSIS — F17210 Nicotine dependence, cigarettes, uncomplicated: Secondary | ICD-10-CM | POA: Insufficient documentation

## 2019-05-27 DIAGNOSIS — I272 Pulmonary hypertension, unspecified: Secondary | ICD-10-CM | POA: Insufficient documentation

## 2019-05-27 DIAGNOSIS — I1311 Hypertensive heart and chronic kidney disease without heart failure, with stage 5 chronic kidney disease, or end stage renal disease: Secondary | ICD-10-CM | POA: Insufficient documentation

## 2019-05-27 DIAGNOSIS — Z888 Allergy status to other drugs, medicaments and biological substances status: Secondary | ICD-10-CM | POA: Insufficient documentation

## 2019-05-27 DIAGNOSIS — I120 Hypertensive chronic kidney disease with stage 5 chronic kidney disease or end stage renal disease: Secondary | ICD-10-CM | POA: Diagnosis not present

## 2019-05-27 DIAGNOSIS — E875 Hyperkalemia: Secondary | ICD-10-CM | POA: Insufficient documentation

## 2019-05-27 DIAGNOSIS — B181 Chronic viral hepatitis B without delta-agent: Secondary | ICD-10-CM | POA: Insufficient documentation

## 2019-05-27 DIAGNOSIS — K7469 Other cirrhosis of liver: Secondary | ICD-10-CM | POA: Insufficient documentation

## 2019-05-27 DIAGNOSIS — I10 Essential (primary) hypertension: Secondary | ICD-10-CM | POA: Diagnosis present

## 2019-05-27 DIAGNOSIS — E46 Unspecified protein-calorie malnutrition: Secondary | ICD-10-CM | POA: Insufficient documentation

## 2019-05-27 DIAGNOSIS — Z886 Allergy status to analgesic agent status: Secondary | ICD-10-CM | POA: Insufficient documentation

## 2019-05-27 DIAGNOSIS — I4891 Unspecified atrial fibrillation: Secondary | ICD-10-CM | POA: Diagnosis not present

## 2019-05-27 DIAGNOSIS — Z66 Do not resuscitate: Secondary | ICD-10-CM | POA: Insufficient documentation

## 2019-05-27 DIAGNOSIS — E8889 Other specified metabolic disorders: Secondary | ICD-10-CM | POA: Insufficient documentation

## 2019-05-27 DIAGNOSIS — R109 Unspecified abdominal pain: Secondary | ICD-10-CM | POA: Diagnosis not present

## 2019-05-27 DIAGNOSIS — J449 Chronic obstructive pulmonary disease, unspecified: Secondary | ICD-10-CM | POA: Diagnosis present

## 2019-05-27 DIAGNOSIS — Z955 Presence of coronary angioplasty implant and graft: Secondary | ICD-10-CM | POA: Insufficient documentation

## 2019-05-27 DIAGNOSIS — M19012 Primary osteoarthritis, left shoulder: Secondary | ICD-10-CM | POA: Insufficient documentation

## 2019-05-27 DIAGNOSIS — I2510 Atherosclerotic heart disease of native coronary artery without angina pectoris: Secondary | ICD-10-CM | POA: Diagnosis present

## 2019-05-27 DIAGNOSIS — I700 Atherosclerosis of aorta: Secondary | ICD-10-CM | POA: Insufficient documentation

## 2019-05-27 DIAGNOSIS — B182 Chronic viral hepatitis C: Secondary | ICD-10-CM | POA: Insufficient documentation

## 2019-05-27 DIAGNOSIS — J811 Chronic pulmonary edema: Secondary | ICD-10-CM | POA: Diagnosis not present

## 2019-05-27 DIAGNOSIS — R000 Tachycardia, unspecified: Secondary | ICD-10-CM | POA: Diagnosis not present

## 2019-05-27 DIAGNOSIS — I4892 Unspecified atrial flutter: Secondary | ICD-10-CM | POA: Insufficient documentation

## 2019-05-27 DIAGNOSIS — I2781 Cor pulmonale (chronic): Secondary | ICD-10-CM | POA: Insufficient documentation

## 2019-05-27 DIAGNOSIS — K219 Gastro-esophageal reflux disease without esophagitis: Secondary | ICD-10-CM | POA: Insufficient documentation

## 2019-05-27 DIAGNOSIS — Z86010 Personal history of colonic polyps: Secondary | ICD-10-CM | POA: Insufficient documentation

## 2019-05-27 LAB — CBC
HCT: 30.3 % — ABNORMAL LOW (ref 39.0–52.0)
Hemoglobin: 10.7 g/dL — ABNORMAL LOW (ref 13.0–17.0)
MCH: 27.4 pg (ref 26.0–34.0)
MCHC: 35.3 g/dL (ref 30.0–36.0)
MCV: 77.7 fL — ABNORMAL LOW (ref 80.0–100.0)
Platelets: 174 10*3/uL (ref 150–400)
RBC: 3.9 MIL/uL — ABNORMAL LOW (ref 4.22–5.81)
RDW: 15.1 % (ref 11.5–15.5)
WBC: 9.6 10*3/uL (ref 4.0–10.5)
nRBC: 0 % (ref 0.0–0.2)

## 2019-05-27 LAB — I-STAT CHEM 8, ED
BUN: 84 mg/dL — ABNORMAL HIGH (ref 6–20)
Calcium, Ion: 1.3 mmol/L (ref 1.15–1.40)
Chloride: 100 mmol/L (ref 98–111)
Creatinine, Ser: 10 mg/dL — ABNORMAL HIGH (ref 0.61–1.24)
Glucose, Bld: 35 mg/dL — CL (ref 70–99)
HCT: 38 % — ABNORMAL LOW (ref 39.0–52.0)
Hemoglobin: 12.9 g/dL — ABNORMAL LOW (ref 13.0–17.0)
Potassium: 7 mmol/L (ref 3.5–5.1)
Sodium: 133 mmol/L — ABNORMAL LOW (ref 135–145)
TCO2: 26 mmol/L (ref 22–32)

## 2019-05-27 LAB — COMPREHENSIVE METABOLIC PANEL
ALT: 16 U/L (ref 0–44)
AST: 31 U/L (ref 15–41)
Albumin: 3.4 g/dL — ABNORMAL LOW (ref 3.5–5.0)
Alkaline Phosphatase: 79 U/L (ref 38–126)
Anion gap: 17 — ABNORMAL HIGH (ref 5–15)
BUN: 58 mg/dL — ABNORMAL HIGH (ref 6–20)
CO2: 20 mmol/L — ABNORMAL LOW (ref 22–32)
Calcium: 10 mg/dL (ref 8.9–10.3)
Chloride: 98 mmol/L (ref 98–111)
Creatinine, Ser: 9.97 mg/dL — ABNORMAL HIGH (ref 0.61–1.24)
GFR calc Af Amer: 6 mL/min — ABNORMAL LOW (ref >60)
GFR calc non Af Amer: 5 mL/min — ABNORMAL LOW (ref >60)
Glucose, Bld: 125 mg/dL — ABNORMAL HIGH (ref 70–99)
Potassium: 6.9 mmol/L (ref 3.5–5.1)
Sodium: 135 mmol/L (ref 135–145)
Total Bilirubin: 0.9 mg/dL (ref 0.3–1.2)
Total Protein: 6.5 g/dL (ref 6.5–8.1)

## 2019-05-27 LAB — GLUCOSE, CAPILLARY
Glucose-Capillary: 86 mg/dL (ref 70–99)
Glucose-Capillary: 147 mg/dL — ABNORMAL HIGH (ref 70–99)
Glucose-Capillary: 26 mg/dL — CL (ref 70–99)
Glucose-Capillary: 57 mg/dL — ABNORMAL LOW (ref 70–99)

## 2019-05-27 LAB — BASIC METABOLIC PANEL
Anion gap: 19 — ABNORMAL HIGH (ref 5–15)
BUN: 64 mg/dL — ABNORMAL HIGH (ref 6–20)
CO2: 22 mmol/L (ref 22–32)
Calcium: 10.2 mg/dL (ref 8.9–10.3)
Chloride: 96 mmol/L — ABNORMAL LOW (ref 98–111)
Creatinine, Ser: 9.95 mg/dL — ABNORMAL HIGH (ref 0.61–1.24)
GFR calc Af Amer: 6 mL/min — ABNORMAL LOW (ref >60)
GFR calc non Af Amer: 5 mL/min — ABNORMAL LOW (ref >60)
Glucose, Bld: 94 mg/dL (ref 70–99)
Potassium: 6.2 mmol/L — ABNORMAL HIGH (ref 3.5–5.1)
Sodium: 137 mmol/L (ref 135–145)

## 2019-05-27 LAB — CBG MONITORING, ED
Glucose-Capillary: 30 mg/dL — CL (ref 70–99)
Glucose-Capillary: 63 mg/dL — ABNORMAL LOW (ref 70–99)

## 2019-05-27 LAB — LIPASE, BLOOD: Lipase: 29 U/L (ref 11–51)

## 2019-05-27 LAB — SARS CORONAVIRUS 2 (TAT 6-24 HRS): SARS Coronavirus 2: NEGATIVE

## 2019-05-27 MED ORDER — CALCIUM GLUCONATE-NACL 1-0.675 GM/50ML-% IV SOLN
1.0000 g | Freq: Once | INTRAVENOUS | Status: AC
  Administered 2019-05-27: 1000 mg via INTRAVENOUS
  Filled 2019-05-27: qty 50

## 2019-05-27 MED ORDER — DEXTROSE 50 % IV SOLN
50.0000 mL | Freq: Once | INTRAVENOUS | Status: AC
  Administered 2019-05-27: 50 mL via INTRAVENOUS
  Filled 2019-05-27: qty 50

## 2019-05-27 MED ORDER — SULFAMETHOXAZOLE-TRIMETHOPRIM 800-160 MG PO TABS
1.0000 | ORAL_TABLET | Freq: Every day | ORAL | Status: DC
  Administered 2019-05-28 (×2): 1 via ORAL
  Filled 2019-05-27 (×2): qty 1

## 2019-05-27 MED ORDER — IPRATROPIUM-ALBUTEROL 0.5-2.5 (3) MG/3ML IN SOLN: 3.0000 mL | RESPIRATORY_TRACT | Status: DC

## 2019-05-27 MED ORDER — METOPROLOL TARTRATE 25 MG PO TABS
25.0000 mg | ORAL_TABLET | Freq: Two times a day (BID) | ORAL | Status: DC
  Administered 2019-05-28 (×2): 25 mg via ORAL
  Filled 2019-05-27 (×2): qty 1

## 2019-05-27 MED ORDER — DEXTROSE 50 % IV SOLN
1.0000 | Freq: Once | INTRAVENOUS | Status: AC
  Administered 2019-05-27: 50 mL via INTRAVENOUS
  Filled 2019-05-27: qty 50

## 2019-05-27 MED ORDER — INSULIN ASPART 100 UNIT/ML ~~LOC~~ SOLN: 10.0000 [IU] | Freq: Once | SUBCUTANEOUS | Status: DC

## 2019-05-27 MED ORDER — ONDANSETRON HCL 4 MG/2ML IJ SOLN
4.0000 mg | Freq: Once | INTRAMUSCULAR | Status: AC
  Administered 2019-05-27: 4 mg via INTRAVENOUS
  Filled 2019-05-27: qty 2

## 2019-05-27 MED ORDER — IPRATROPIUM-ALBUTEROL 0.5-2.5 (3) MG/3ML IN SOLN: 3.0000 mL | RESPIRATORY_TRACT | Status: AC

## 2019-05-27 MED ORDER — PANTOPRAZOLE SODIUM 40 MG PO TBEC: 40.0000 mg | DELAYED_RELEASE_TABLET | Freq: Every day | ORAL | Status: DC

## 2019-05-27 MED ORDER — SODIUM BICARBONATE 8.4 % IV SOLN
INTRAVENOUS | Status: AC
  Filled 2019-05-27: qty 50

## 2019-05-27 MED ORDER — SODIUM CHLORIDE 0.9 % IV SOLN
2.0000 g | INTRAVENOUS | Status: DC
  Administered 2019-05-27: 2 g via INTRAVENOUS
  Filled 2019-05-27: qty 20
  Filled 2019-05-27: qty 2

## 2019-05-27 MED ORDER — SEVELAMER CARBONATE 800 MG PO TABS
1600.0000 mg | ORAL_TABLET | Freq: Three times a day (TID) | ORAL | Status: DC
  Administered 2019-05-28: 1600 mg via ORAL
  Filled 2019-05-27 (×2): qty 2

## 2019-05-27 MED ORDER — ALBUTEROL SULFATE (2.5 MG/3ML) 0.083% IN NEBU: 10.0000 mg | INHALATION_SOLUTION | Freq: Once | RESPIRATORY_TRACT | Status: DC

## 2019-05-27 MED ORDER — SEVELAMER CARBONATE 800 MG PO TABS
1600.0000 mg | ORAL_TABLET | ORAL | Status: DC
  Administered 2019-05-28: 1600 mg via ORAL
  Filled 2019-05-27: qty 2

## 2019-05-27 MED ORDER — MOMETASONE FURO-FORMOTEROL FUM 200-5 MCG/ACT IN AERO
2.0000 | INHALATION_SPRAY | Freq: Two times a day (BID) | RESPIRATORY_TRACT | Status: DC
  Administered 2019-05-28: 2 via RESPIRATORY_TRACT
  Filled 2019-05-27: qty 8.8

## 2019-05-27 MED ORDER — INSULIN ASPART 100 UNIT/ML IV SOLN
5.0000 [IU] | Freq: Once | INTRAVENOUS | Status: AC
  Administered 2019-05-27: 5 [IU] via INTRAVENOUS

## 2019-05-27 MED ORDER — HYDROMORPHONE HCL 1 MG/ML IJ SOLN
1.0000 mg | Freq: Once | INTRAMUSCULAR | Status: AC
  Administered 2019-05-27: 1 mg via INTRAVENOUS
  Filled 2019-05-27: qty 1

## 2019-05-27 MED ORDER — SODIUM BICARBONATE 8.4 % IV SOLN
50.0000 meq | Freq: Once | INTRAVENOUS | Status: AC
  Administered 2019-05-27: 50 meq via INTRAVENOUS

## 2019-05-27 MED ORDER — HEPARIN SODIUM (PORCINE) 5000 UNIT/ML IJ SOLN: 5000.0000 [IU] | Freq: Three times a day (TID) | INTRAMUSCULAR | Status: DC

## 2019-05-27 MED ORDER — LACTULOSE 10 GM/15ML PO SOLN
30.0000 g | Freq: Two times a day (BID) | ORAL | Status: DC
  Filled 2019-05-27 (×2): qty 45

## 2019-05-27 MED ORDER — INSULIN ASPART 100 UNIT/ML ~~LOC~~ SOLN
10.0000 [IU] | Freq: Once | SUBCUTANEOUS | Status: AC
  Administered 2019-05-27: 10 [IU] via SUBCUTANEOUS

## 2019-05-27 MED ORDER — SODIUM CHLORIDE 0.9% FLUSH: 3.0000 mL | Freq: Once | INTRAVENOUS | Status: DC

## 2019-05-27 MED ORDER — HYDROMORPHONE HCL 1 MG/ML IJ SOLN
0.5000 mg | Freq: Once | INTRAMUSCULAR | Status: AC
  Administered 2019-05-27: 0.5 mg via INTRAVENOUS
  Filled 2019-05-27: qty 1

## 2019-05-27 MED ORDER — DEXTROSE 50 % IV SOLN
12.5000 g | INTRAVENOUS | Status: AC
  Administered 2019-05-27: 21:00:00 12.5 g via INTRAVENOUS

## 2019-05-27 MED ORDER — DEXTROSE 50 % IV SOLN
25.0000 g | INTRAVENOUS | Status: AC
  Administered 2019-05-27: 25 g via INTRAVENOUS

## 2019-05-27 MED ORDER — SODIUM ZIRCONIUM CYCLOSILICATE 10 G PO PACK: 10.0000 g | PACK | Freq: Two times a day (BID) | ORAL | Status: DC

## 2019-05-27 MED ORDER — ALBUTEROL SULFATE HFA 108 (90 BASE) MCG/ACT IN AERS: 2.0000 | INHALATION_SPRAY | Freq: Four times a day (QID) | RESPIRATORY_TRACT | Status: DC | PRN

## 2019-05-27 MED ORDER — HYDROMORPHONE HCL 1 MG/ML IJ SOLN
1.0000 mg | INTRAMUSCULAR | Status: DC | PRN
  Administered 2019-05-27 – 2019-05-28 (×2): 1 mg via INTRAVENOUS
  Filled 2019-05-27 (×2): qty 1

## 2019-05-27 MED ORDER — DEXTROSE 10 % IV SOLN: INTRAVENOUS | Status: DC

## 2019-05-27 MED ORDER — SODIUM ZIRCONIUM CYCLOSILICATE 10 G PO PACK: 10.0000 g | PACK | Freq: Three times a day (TID) | ORAL | Status: DC

## 2019-05-27 MED ORDER — SODIUM CHLORIDE 0.9 % IV SOLN: 1.0000 g | Freq: Once | INTRAVENOUS | Status: DC

## 2019-05-27 MED ORDER — DEXTROSE 50 % IV SOLN
INTRAVENOUS | Status: AC
  Administered 2019-05-27: 12.5 g via INTRAVENOUS
  Filled 2019-05-27: qty 50

## 2019-05-27 MED ORDER — MONTELUKAST SODIUM 10 MG PO TABS: 10.0000 mg | ORAL_TABLET | Freq: Every evening | ORAL | Status: DC

## 2019-05-27 NOTE — Procedures (Signed)
   I was present at this dialysis session, have reviewed the session itself and made  appropriate changes [name] MD [facility] pager [phone]   05/27/2019, 6:59 PM

## 2019-05-27 NOTE — Progress Notes (Signed)
Hypoglycemic Event  CBG:  26 Treatment:  1 amp of D50 Symptoms:  Sweating and SOB Follow-up CBG: 57 Possible Reasons for Event: Unknown

## 2019-05-27 NOTE — ED Notes (Signed)
CBG Results of 63 Reported to [place], Therapist, sports.

## 2019-05-27 NOTE — ED Triage Notes (Signed)
Patient complains of ongoing abdominal pain since Thursday with last dialysis on Wednesday. Missed dialysis friday

## 2019-05-27 NOTE — H&P (Addendum)
Date: 05/27/2019               Patient Name:  [name] MRN: [id number]  DOB: Apr 24, 1961 Age / Sex: 58 y.o., male   PCP: [name], FNP              Medical Service: Internal Medicine Teaching Service              Attending Physician: Dr. Lucious Groves, DO    First Contact: Roxan Diesel, MS 3 Pager: [phone]  Second Contact: Dr. Darrick Meigs Pager: [phone]  Third Contact Dr. Koleen Distance Pager: [phone]       After Hours (After 5p/  First Contact Pager: [phone]  weekends / holidays): Second Contact Pager: [phone]   Chief Complaint: abdominal pain  History of Present Illness: [name] is a 58 y.o. male with PMHx of decompensated cirrhosis with recurrent ascites and history of SBP, ESRD on HD, atrial fibrillation, COPD, HTN, and CAD s/p stent 02/2017 who presented for abdominal pain and was found to have hyperkalemia.  Patient has chronic abdominal pain and recurrent ascites for which he has been getting paracentesis multiple times per month.  He states his abdominal pain has been growing more severe since his most recent paracentesis 3 days ago.  Currently he rates pain at 10/10.  He also reports associated nausea but denies vomiting.  He has also had diarrhea 4x/day without blood.  Denies fevers or chills.  He has also had some shortness of breath but denies chest pain.  He typically has hemodialysis on Monday-Wednesday-Friday but he missed his Friday session due to his abdominal pain.    Patient has been seen in the ER multiple times in the past month for abdominal pain and dyspnea which has been suspected to be due to his ascites.  He has been referred to [facility] for consideration of liver transplant but it was felt that he was not a liver transplant candidate due to issues with adherence to dialysis regimen as well as his severe mitral stenosis and elevated PA pressures.  TIPS procedure has been discussed, but given his cardiac conditions he has  been referred to cardiology to assess whether this would be an option, and has not yet seen them.   Meds:  Current Outpatient Medications  Medication Instructions  . albuterol (VENTOLIN HFA) 108 (90 Base) MCG/ACT inhaler 2 puffs, Inhalation, Every 6 hours PRN  . hydrALAZINE (APRESOLINE) 100 mg, Oral, 3 times daily  . lactulose (CHRONULAC) 10 GM/15ML solution TAKE 45 MLS BY MOUTH 2 TIMES DAILY.  . metoprolol tartrate (LOPRESSOR) 25 mg, Oral, 2 times daily  . montelukast (SINGULAIR) 10 mg, Oral, Every evening  . ondansetron (ZOFRAN ODT) 4 mg, Oral, Every 8 hours PRN  . pantoprazole (PROTONIX) 40 mg, Oral, Daily  . sevelamer carbonate (RENVELA) 800 MG tablet Take 4 tablets with meals  And 2 tablets twice daily with snacks  . sulfamethoxazole-trimethoprim (BACTRIM DS) 800-160 MG tablet 1 tablet, Oral, Daily  . SYMBICORT 160-4.5 MCG/ACT inhaler 2 puffs, Inhalation, 2 times daily  . zolpidem (AMBIEN) 10 mg, Oral, At bedtime PRN     Allergies: Allergies as of 05/27/2019 - Review Complete 05/27/2019  Allergen Reaction Noted  . Morphine and related Other (See Comments) 12/23/2017  . Aspirin Other (See Comments) 02/27/2014  . Clonidine derivatives Itching 08/05/2013  . Tramadol Itching 02/25/2011  . Tylenol [acetaminophen] Nausea Only 10/27/2017   Past Medical History:  Diagnosis Date  . Anemia   .  Anxiety   . Arthritis    left shoulder  . Atherosclerosis of aorta ([place])   . Cardiomegaly   . Chest pain    DATE UNKNOWN, C/O PERIODICALLY  . Cocaine abuse ([place])   . COPD exacerbation ([place]) 08/17/2016  . Coronary artery disease    stent 02/22/17  . ESRD (end stage renal disease) on dialysis ([facility])    "[name]; MWF" (07/04/2017)  . GERD (gastroesophageal reflux disease)    DATE UNKNOWN  . Hemorrhoids   . Hepatitis B, chronic ([place])   . Hepatitis C   . History of kidney stones   . Hyperkalemia   . Hypertension   . Kidney failure   . Metabolic bone disease    Patient denies  .  Mitral stenosis   . Myocardial infarction ([facility])   . Pneumonia   . Pulmonary edema   . Solitary rectal ulcer syndrome 07/2017   at [facility] for rectal bleeding  . Tubular adenoma of colon     Family History:  Family History  Problem Relation Age of Onset  . Heart disease Mother   . Lung cancer Mother   . Heart disease Father   . Malignant hyperthermia Father   . COPD Father   . Throat cancer Sister   . Esophageal cancer Sister   . Hypertension Other   . COPD Other   . Colon cancer Neg Hx   . Colon polyps Neg Hx   . Rectal cancer Neg Hx   . Stomach cancer Neg Hx    Social History:  Social History   Tobacco Use  . Smoking status: Current Every Day Smoker    Packs/day: 0.50    Years: 43.00    Pack years: 21.50    Types: Cigarettes    Start date: 08/13/1973  . Smokeless tobacco: Never Used  . Tobacco comment: i dont know i just make   Substance Use Topics  . Alcohol use: Not Currently    Frequency: Never    Comment: quit drinking in 2017  . Drug use: Yes    Types: Marijuana, Cocaine    Comment: reports using once every 3 months, 04-06-2019 was this     Review of Systems: A complete ROS was negative except as per HPI.   Physical Exam: Blood pressure (!) 198/102, pulse (!) 45 (but 90 on cardiac monitor), resp. rate 16, SpO2 98 % General: appears uncomfortable, intermittently becomes less responsive during exam but becomes alert and responsive again with questioning Pulm: crackles bilaterally, normal work of breathing on room air CV: regular rate, regular rhythm GI: abdomen distended but soft, mildly tender diffusely MSK: no peripheral edema, +asterixis Neuro: A&Ox3; no focal deficits Skin: warm and dry Psych: normal mood and affect    EKG: personally reviewed my interpretation is peaked T waves, LBBB, ?RBBB, PVCs  CXR: personally reviewed my interpretation is stable cardiomegaly  Assessment & Plan by Problem: Principal Problem:   Hyperkalemia Active  Problems:   COPD (chronic obstructive pulmonary disease) (HCC)   Hypertension   Anemia due to chronic kidney disease   ESRD on dialysis ([place])   Liver cirrhosis (HCC)   Paroxysmal atrial fibrillation (HCC)   Atherosclerosis of native coronary artery of native heart without angina pectoris  Hyperkalemia: K of 6.9 on arrival  Likely due to exacerbation of his chronic kidney disease from missing a session of dialysis. He has acidosis with AG 17 and CO2 20 but this is stable over the past month.  He  takes metoprolol and TMP-SMX but has been on the same dose of these for some time so not likely to cause acute worsening of hyperkalemia.  EKG shows peaked T-waves.  He received IV calcium gluconate and insulin 10u along with dextrose in the ED, as well as a sodium bicarbonate injection, but K was still 7 several hours later.  Blood glucose dropped to <30 after receiving insulin so he was given IV dextrose with subsequent rise back to 53. - Repeat IV calcium gluconate given ongoing marked hyperkalemia with EKG changes - Repeat IV insulin along with dextrose.  Giving 5u rather than 10u per Pharmacy to prevent hypoglycemia. - Give albuterol nebulizer - Monitor BG - Admit for emergent inpatient hemodialysis today.  Nephrology consulted - Continuous cardiac monitoring  Decompensated cirrhosis due to prior alcohol use, chronic hep B and hep C:  Low suspicion for SBP given that he is afebrile with normal WBC.  Mentation was slightly altered during exam but it was later determined that he was likely very hypoglycemic at that time, and he has since become more alert.  He is also already on SBP prophylaxis with TMP-SMX 800-160mg  daily.  Abdominal pain is likely due to his chronic recurrent ascites. - Start ceftriaxone 2g daily for SBP coverage and bactrim resistant organisms - Continue home lactulose 30g BID - Hold home pantoprazole as it can increase SBP risk  ESRD on hemodialysis Monday-Wednesday-Friday:  Initiating hemodialysis today as above.  Cr today is 10, which does not appear to be unusual for him (Cr fluctuating from ~5-12 over the past several months).  BUN is 58, CO2 20, anion gap 17, Cl 98, Ca 10. - Continue to monitor renal function - Sevelamer 1,600mg  with meals  Hypertension  Atrial fibrillation:  Initially hypertensive to the 242A-834H systolic in the ED, but now in the 150s-170s/70s-90s.  No signs of volume overload. - Continue home metoprolol 25mg  BID - Continue to monitor  COPD: Currently good O2 saturation on room air.  Patient states he is not on home oxygen. Ruthe Mannan 2 puffs BID - Montelukast 10 mg every evening  CAD: No signs of ACS currently. Last left heart cath in 02/2017 showed 90% occlusion of RCA, for which he received an angioplasty with stent placement. Last echo in 03/2018 showed LVEF 45-50% with severe concentric remodeling of the left ventricle, mild global hypokinesis, abnormal septal wall motion due to right ventricular volume and pressure overload, moderately dilated left and right atrial cavities, moderate-to-severe pulmonary hypertension, and moderate tricuspid regurgitation. Chronic Cor Pulmonale. Last echo in January estimates PAP ~46mmHg. Sees cardiology for this. Continue to monitor for now and monitor for s/s of pulmonary edema  Anemia due to chronic kidney disease: Hgb stable at 10.7-12.9.   - Continue to monitor CBC   CODE STATUS: DNR Diet: NPO Pain management: Dilaudid 1mg  q4hr prn for severe pain DVT prophylaxis: Heparin 5,000 units subq q8hrs  Dispo: Admit patient to Inpatient with expected length of stay greater than 2 midnights.  Signed: Tonia Ghent, Medical Student 05/27/2019, 6:15 PM  Pager: [phone]  Attestation for Student Documentation:  I personally was present and performed or re-performed the history, physical exam and medical decision-making activities of this service and have verified that the service and findings  are accurately documented in the student's note.  Mitzi Hansen, MD 05/27/2019, 6:56 PM

## 2019-05-27 NOTE — ED Notes (Signed)
CBG Results of 30 Reported to [place], Therapist, sports.

## 2019-05-27 NOTE — Progress Notes (Signed)
Transport here to take patient to hemodialysis. Spoke with hemodialysis nurse Bo, RN and informed her that the patient's CBG is 57 and I gave him D50 per protocol. Bo, RN stated she would recheck patient's CBG when he arrived.

## 2019-05-27 NOTE — ED Notes (Addendum)
Pt not in waiting room.  Staff has not seen pt leave.  BP cuff and labels lying in wheelchair.

## 2019-05-27 NOTE — ED Notes (Signed)
RN called pt's number and he is at home laying down.  He is on the way back to the ED.

## 2019-05-27 NOTE — Consult Note (Addendum)
[name] Renal Consultation Note  Indication for Consultation:  Management of ESRD/hemodialysis; anemia, hypertension/volume and secondary hyperparathyroidism  HPI: [name] is a 58 y.o. male with ESRD ( chronic HD  MWF [facility] Hep B )cirrhosis of the liver with recurrent ascites last drained about a week ago( 10/27  Per IR notes), atrial fibrillation on Lopressor/ ASA, Substance abuse , COPD,MS , HTN  ,Anxiety  presents to ER co Abdominal pain.and weakness Last HD wed 11/11, and he has signed off early last 6 txs. About 2.2-2.45min of 4 hr tx .  Noted on ER  Wide complex rhythm peaked t waves   And NS after IV bicarb  Currently denies sob was sleeping when I walked in his  ER room. Reports some diarrhea , no fevers , sweats, chills, dizzinees, or trouble using hs AVGG.      Past Medical History:  Diagnosis Date  . Anemia   . Anxiety   . Arthritis    left shoulder  . Atherosclerosis of aorta ([place])   . Cardiomegaly   . Chest pain    DATE UNKNOWN, C/O PERIODICALLY  . Cocaine abuse ([place])   . COPD exacerbation ([place]) 08/17/2016  . Coronary artery disease    stent 02/22/17  . ESRD (end stage renal disease) on dialysis ([place])    "[name]; MWF" (07/04/2017)  . GERD (gastroesophageal reflux disease)    DATE UNKNOWN  . Hemorrhoids   . Hepatitis B, chronic ([place])   . Hepatitis C   . History of kidney stones   . Hyperkalemia   . Hypertension   . Kidney failure   . Metabolic bone disease    Patient denies  . Mitral stenosis   . Myocardial infarction ([place])   . Pneumonia   . Pulmonary edema   . Solitary rectal ulcer syndrome 07/2017   at flex sig for rectal bleeding  . Tubular adenoma of colon     Past Surgical History:  Procedure Laterality Date  . A/V FISTULAGRAM Left 05/26/2017   Procedure: A/V FISTULAGRAM;  Surgeon: Conrad Williston, MD;  Location: [place] [facility];  Service: Cardiovascular;  Laterality: Left;  . A/V FISTULAGRAM Right  11/18/2017   Procedure: A/V FISTULAGRAM - Right Arm;  Surgeon: Elam Dutch, MD;  Location: [place] [facility];  Service: Cardiovascular;  Laterality: Right;  . APPLICATION OF WOUND VAC Left 06/14/2017   Procedure: APPLICATION OF WOUND VAC;  Surgeon: Katha Cabal, MD;  Location: [facility];  Service: Vascular;  Laterality: Left;  . AV FISTULA PLACEMENT  2012   BELIEVED WAS PLACED IN JUNE  . AV FISTULA PLACEMENT Right 08/09/2017   Procedure: Creation Right arm ARTERIOVENOUS BRACHIOCEPOHALIC FISTULA;  Surgeon: Elam Dutch, MD;  Location: [facility] OR;  Service: Vascular;  Laterality: Right;  . AV FISTULA PLACEMENT Right 11/22/2017   Procedure: INSERTION OF ARTERIOVENOUS (AV) GORE-TEX GRAFT RIGHT UPPER ARM;  Surgeon: Elam Dutch, MD;  Location: [place];  Service: Vascular;  Laterality: Right;  . BIOPSY  01/25/2018   Procedure: BIOPSY;  Surgeon: Jerene Bears, MD;  Location: [place];  Service: Gastroenterology;;  . BIOPSY  04/10/2019   Procedure: BIOPSY;  Surgeon: Jerene Bears, MD;  Location: [facility];  Service: Gastroenterology;;  . COLONOSCOPY    . COLONOSCOPY WITH PROPOFOL N/A 01/25/2018   Procedure: COLONOSCOPY WITH PROPOFOL;  Surgeon: Jerene Bears, MD;  Location: [place];  Service: Gastroenterology;  Laterality: N/A;  . CORONARY STENT  INTERVENTION N/A 02/22/2017   Procedure: CORONARY STENT INTERVENTION;  Surgeon: Nigel Mormon, MD;  Location: [place] [facility];  Service: Cardiovascular;  Laterality: N/A;  . ESOPHAGOGASTRODUODENOSCOPY (EGD) WITH PROPOFOL N/A 01/25/2018   Procedure: ESOPHAGOGASTRODUODENOSCOPY (EGD) WITH PROPOFOL;  Surgeon: Jerene Bears, MD;  Location: [place];  Service: Gastroenterology;  Laterality: N/A;  . ESOPHAGOGASTRODUODENOSCOPY (EGD) WITH PROPOFOL N/A 04/10/2019   Procedure: ESOPHAGOGASTRODUODENOSCOPY (EGD) WITH PROPOFOL;  Surgeon: Jerene Bears, MD;  [facility]: [facility];  Service: Gastroenterology;  Laterality: N/A;  . FLEXIBLE  SIGMOIDOSCOPY N/A 07/15/2017   Procedure: FLEXIBLE SIGMOIDOSCOPY;  Surgeon: Carol Ada, MD;  Location: [place];  Service: Endoscopy;  Laterality: N/A;  . HEMORRHOID BANDING    . I&D EXTREMITY Left 06/01/2017   Procedure: IRRIGATION AND DEBRIDEMENT LEFT ARM HEMATOMA WITH LIGATION OF LEFT ARM AV FISTULA;  Surgeon: Elam Dutch, MD;  Location: [place];  Service: Vascular;  Laterality: Left;  . I&D EXTREMITY Left 06/14/2017   Procedure: IRRIGATION AND DEBRIDEMENT EXTREMITY;  Surgeon: Katha Cabal, MD;  Location: [facility];  Service: Vascular;  Laterality: Left;  . INSERTION OF DIALYSIS CATHETER  05/30/2017  . INSERTION OF DIALYSIS CATHETER N/A 05/30/2017   Procedure: INSERTION OF DIALYSIS CATHETER;  Surgeon: Elam Dutch, MD;  Location: [place];  Service: Vascular;  Laterality: N/A;  . IR PARACENTESIS  08/30/2017  . IR PARACENTESIS  09/29/2017  . IR PARACENTESIS  10/28/2017  . IR PARACENTESIS  11/09/2017  . IR PARACENTESIS  11/16/2017  . IR PARACENTESIS  11/28/2017  . IR PARACENTESIS  12/01/2017  . IR PARACENTESIS  12/06/2017  . IR PARACENTESIS  01/03/2018  . IR PARACENTESIS  01/23/2018  . IR PARACENTESIS  02/07/2018  . IR PARACENTESIS  02/21/2018  . IR PARACENTESIS  03/06/2018  . IR PARACENTESIS  03/17/2018  . IR PARACENTESIS  04/04/2018  . IR PARACENTESIS  12/28/2018  . IR PARACENTESIS  01/08/2019  . IR PARACENTESIS  01/23/2019  . IR PARACENTESIS  02/01/2019  . IR PARACENTESIS  02/19/2019  . IR PARACENTESIS  03/01/2019  . IR PARACENTESIS  03/15/2019  . IR PARACENTESIS  04/03/2019  . IR PARACENTESIS  04/12/2019  . IR PARACENTESIS  05/01/2019  . IR PARACENTESIS  05/08/2019  . IR PARACENTESIS  05/24/2019  . IR RADIOLOGIST EVAL & MGMT  02/14/2018  . IR RADIOLOGIST EVAL & MGMT  02/22/2019  . LEFT HEART CATH AND CORONARY ANGIOGRAPHY N/A 02/22/2017   Procedure: LEFT HEART CATH AND CORONARY ANGIOGRAPHY;  Surgeon: Nigel Mormon, MD;  Location: [place] [facility];  Service: Cardiovascular;   Laterality: N/A;  . LIGATION OF ARTERIOVENOUS  FISTULA Left 12/10/4429   Procedure: Plication of Left Arm Arteriovenous Fistula;  Surgeon: Elam Dutch, MD;  Location: [place];  Service: Vascular;  Laterality: Left;  . POLYPECTOMY    . POLYPECTOMY  01/25/2018   Procedure: POLYPECTOMY;  Surgeon: Jerene Bears, MD;  Location: [place];  Service: Gastroenterology;;  . REVISON OF ARTERIOVENOUS FISTULA Left 5/40/0867   Procedure: PLICATION OF DISTAL ANEURYSMAL SEGEMENT OF LEFT UPPER ARM ARTERIOVENOUS FISTULA;  Surgeon: Elam Dutch, MD;  Location: [place];  Service: Vascular;  Laterality: Left;  . REVISON OF ARTERIOVENOUS FISTULA Left 12/29/5091   Procedure: Plication of Left Upper Arm Fistula ;  Surgeon: Waynetta Sandy, MD;  Location: [place];  Service: Vascular;  Laterality: Left;  . SKIN GRAFT SPLIT THICKNESS LEG / FOOT Left    SKIN GRAFT SPLIT THICKNESS LEFT ARM DONOR SITE:  LEFT ANTERIOR THIGH  . SKIN SPLIT GRAFT Left 07/04/2017   Procedure: SKIN GRAFT SPLIT THICKNESS LEFT ARM DONOR SITE: LEFT ANTERIOR THIGH;  Surgeon: Elam Dutch, MD;  Location: [place];  Service: Vascular;  Laterality: Left;  . THROMBECTOMY W/ EMBOLECTOMY Left 06/05/2017   Procedure: EXPLORATION OF LEFT ARM FOR BLEEDING; OVERSEWED PROXIMAL FISTULA;  Surgeon: Angelia Mould, MD;  Location: [place];  Service: Vascular;  Laterality: Left;  . WOUND EXPLORATION Left 06/03/2017   Procedure: WOUND EXPLORATION WITH WOUND VAC APPLICATION TO LEFT ARM;  Surgeon: Angelia Mould, MD;  Location[facility];  Service: Vascular;  Laterality: Left;      Family History  Problem Relation Age of Onset  . Heart disease Mother   . Lung cancer Mother   . Heart disease Father   . Malignant hyperthermia Father   . COPD Father   . Throat cancer Sister   . Esophageal cancer Sister   . Hypertension Other   . COPD Other   . Colon cancer Neg Hx   . Colon polyps Neg Hx   . Rectal cancer Neg Hx   . Stomach cancer Neg Hx        reports that he has been smoking cigarettes. He started smoking about 45 years ago. He has a 21.50 pack-year smoking history. He has never used smokeless tobacco. He reports previous alcohol use. He reports current drug use. Drugs: Marijuana and Cocaine.   Allergies  Allergen Reactions  . Morphine And Related Other (See Comments)    Stomach pain  . Aspirin Other (See Comments)    STOMACH PAIN  . Clonidine Derivatives Itching  . Tramadol Itching  . Tylenol [Acetaminophen] Nausea Only    Stomach ache    Prior to Admission medications   Medication Sig Start Date End Date Taking? Authorizing Provider  albuterol (VENTOLIN HFA) 108 (90 Base) MCG/ACT inhaler Inhale 2 puffs into the lungs every 6 (six) hours as needed for wheezing or shortness of breath.    [provider]  hydrALAZINE (APRESOLINE) 100 MG tablet Take 100 mg by mouth 3 (three) times daily.  02/13/19   [provider]  lactulose (CHRONULAC) 10 GM/15ML solution TAKE 45 MLS BY MOUTH 2 TIMES DAILY. 05/25/19   Pyrtle, Lajuan Lines, MD  metoprolol tartrate (LOPRESSOR) 25 MG tablet Take 1 tablet (25 mg total) by mouth 2 (two) times daily. 03/11/18   Geradine Girt, DO  montelukast (SINGULAIR) 10 MG tablet Take 10 mg by mouth every evening.     [provider]  ondansetron (ZOFRAN ODT) 4 MG disintegrating tablet Take 1 tablet (4 mg total) by mouth every 8 (eight) hours as needed for nausea or vomiting. 05/07/19   Fredia Sorrow, MD  oxyCODONE (OXY IR/ROXICODONE) 5 MG immediate release tablet Take 2.5 mg by mouth 2 (two) times daily as needed for moderate pain.  05/03/19   [provider]  pantoprazole (PROTONIX) 40 MG tablet Take 1 tablet (40 mg total) by mouth daily. Patient taking differently: Take 40 mg by mouth every evening.  03/22/19   Zehr, Laban Emperor, PA-C  sevelamer carbonate (RENVELA) 800 MG tablet Take 4 tablets with meals  And 2 tablets twice daily with snacks Patient taking differently: Take  1,600 mg by mouth See admin instructions. Take 2 tablets (1600mg ) with meals & with snacks 01/22/18   Patrecia Pour, MD  sulfamethoxazole-trimethoprim (BACTRIM DS) 800-160 MG tablet Take 1 tablet by mouth daily. Patient taking differently: Take 1 tablet  by mouth 2 (two) times daily.  12/30/18   Geradine Girt, DO  SYMBICORT 160-4.5 MCG/ACT inhaler Inhale 2 puffs into the lungs 2 (two) times daily.  11/30/18   [provider]  zolpidem (AMBIEN) 10 MG tablet Take 10 mg by mouth at bedtime as needed for sleep.    [provider]      Results for orders placed or performed during the hospital encounter of 05/27/19 (from the past 48 hour(s))  Lipase, blood     Status: None   Collection Time: 05/27/19  9:44 AM  Result Value Ref Range   Lipase 29 11 - 51 U/L    Comment: Performed at [facility], [place] [address], [place], [place] [address]  Comprehensive metabolic panel     Status: Abnormal   Collection Time: 05/27/19  9:44 AM  Result Value Ref Range   Sodium 135 135 - 145 mmol/L   Potassium 6.9 (HH) 3.5 - 5.1 mmol/L    Comment: NO VISIBLE HEMOLYSIS CRITICAL RESULT CALLED TO, READ BACK BY AND VERIFIED WITH: A.MCKEOWN RN @ 2025 05/27/2019 BY C.EDENS    Chloride 98 98 - 111 mmol/L   CO2 20 (L) 22 - 32 mmol/L   Glucose, Bld 125 (H) 70 - 99 mg/dL   BUN 58 (H) 6 - 20 mg/dL   Creatinine, Ser 9.97 (H) 0.61 - 1.24 mg/dL   Calcium 10.0 8.9 - 10.3 mg/dL   Total Protein 6.5 6.5 - 8.1 g/dL   Albumin 3.4 (L) 3.5 - 5.0 g/dL   AST 31 15 - 41 U/L   ALT 16 0 - 44 U/L   Alkaline Phosphatase 79 38 - 126 U/L   Total Bilirubin 0.9 0.3 - 1.2 mg/dL   GFR calc non Af Amer 5 (L) >60 mL/min   GFR calc Af Amer 6 (L) >60 mL/min   Anion gap 17 (H) 5 - 15    Comment: Performed at [facility] [address] [place], [place] [address]  CBC     Status: Abnormal   Collection Time: 05/27/19  9:44 AM  Result Value Ref Range   WBC 9.6 4.0 - 10.5 K/uL   RBC 3.90 (L) 4.22 - 5.81 MIL/uL    Hemoglobin 10.7 (L) 13.0 - 17.0 g/dL   HCT 30.3 (L) 39.0 - 52.0 %   MCV 77.7 (L) 80.0 - 100.0 fL   MCH 27.4 26.0 - 34.0 pg   MCHC 35.3 30.0 - 36.0 g/dL   RDW 15.1 11.5 - 15.5 %   Platelets 174 150 - 400 K/uL   nRBC 0.0 0.0 - 0.2 %    Comment: Performed at [facility], [place] [address] [place], [place] [address]    ROS: see hpi  Physical Exam: Vitals:   05/27/19 1520  BP: (!) 205/75  Pulse: (!) 45  Resp: 20  SpO2: 98%     General: thin chronically ill AAM, NAD  Slightly more lethargic than his baseline but appropriate  HEENT: [place], Mild yellowing of sclera, eomi, MMdry  Neck: supple, no jvd Heart: Irreg, irreg Rate stable, 1/6 sem ,no rub or gallop Lungs: CTA , unlabored breathing  Abdomen: BS pos and slightly hyperactive, ascites( but better than baseline), tender R upper and lower  Extremities: No pedal edema Skin:  No pedal ulcers or overt rash appreciated , warm dry Neuro: Alert , Ox3, moves all extrem , no acute focal deficits appreciated  Dialysis Access:Pos bruit R UA AVGG  Dialysis Orders: [facility]   on MWF  ( HEP B +) isolation . EDW 65 kg  HD Bath 2k/ 2ca  Time 4hr Heparin none . Access RUA AVGG  BFR 450  DFR 800    [employer] Calcitriol  2.5 mcg po / hd  O esa , last 2  hgb11    Assessment/Plan 1. Hyperkalemia- missed hd Friday and cutting txs short on hd , Iv meds in Er and emergent HD today Initial EKG's worrisome but looking back pt has LBBB at baseline. Bigeminy improved after IV NaHCO3 in ED, now is in NSR (w/ LBBB). Planning semi-urgent HD today upstairs, get K+ down. Should be on machine now.  2. ESRD -  Nl MWF schedule / tx tonight and full tx mon 3. Abdominal pain = wu per admit 4. Hypertension/volume  - no excess vol on exam  htn in ER  Hone meds Lopressor, Hydralazine , fu trend in [place]  With hd  5. Anemia  - no esa needs with 10.7. 12.9 hgb  6. Metabolic bone disease -  Renvela binder when eating , Po vit d on HD  7. Hep B positive/ ho  Cirrhosis/recurrent ascites - Paracentesis  Prn/ Noted to Have [facility] Eval at [facility] soon "appointmet pending per pt ,ihave to call for time "  8. Noncompliance- on going issue  , but is better with attending txs /just not full time recently     Ernest Haber, PA-C [place] [phone] 05/27/2019, 3:44 PM   Pt seen, examined and agree w assess/plan as above with additions as indicated.  [facility] 05/27/2019, 6:59 PM

## 2019-05-27 NOTE — ED Triage Notes (Signed)
C/o recurrent generalized abd pain with nausea since Thursday.  Last dialysis on Wednesday.  States he didn't go to dialysis on Friday due to abd pain and being below his dry weight.

## 2019-05-27 NOTE — Progress Notes (Signed)
NEW ADMISSION NOTE New Admission Note:   Arrival Method: stretcher from ED Mental Orientation: alert and oriented x4  Telemetry: box: 21  Assessment: Completed Skin: dry and intact  IV: LFA Pain: 0 Safety Measures: Safety Fall Prevention Plan has been given, discussed and signed Admission: Completed [facility] Orientation: Patient has been orientated to the room, unit and staff.  Family: NA  Orders have been reviewed and implemented. Will continue to monitor the patient. Call light has been placed within reach and bed alarm has been activated.   Baldo Ash, RN

## 2019-05-27 NOTE — ED Provider Notes (Signed)
[facility] EMERGENCY DEPARTMENT Provider Note   CSN: [id number] Arrival date & time: 05/27/19  1229     History   Chief Complaint No chief complaint on file.   HPI [name] is a 58 y.o. male.     Patient is a 58 year old gentleman with past medical history of COPD, end-stage renal disease on dialysis Monday, Wednesday, Friday, hypertension, cirrhosis with ascites presenting to the emergency department for lower abdominal pain.  Patient had a paracentesis last Thursday.  Reports that his belly is feeling smaller with less pressure but he is having some lower abdominal pain.  Denies any fever, chills, dysuriaeeling well so he did not go.  Denies any chest pain, shortness of breath, fever, chills, nausea, vomiting     Past Medical History:  Diagnosis Date  . Anemia   . Anxiety   . Arthritis    left shoulder  . Atherosclerosis of aorta ([place])   . Cardiomegaly   . Chest pain    DATE UNKNOWN, C/O PERIODICALLY  . Cocaine abuse ([place])   . COPD exacerbation ([place]) 08/17/2016  . Coronary artery disease    stent 02/22/17  . ESRD (end stage renal disease) on dialysis ([place])    "[name]; MWF" (07/04/2017)  . GERD (gastroesophageal reflux disease)    DATE UNKNOWN  . Hemorrhoids   . Hepatitis B, chronic ([place])   . Hepatitis C   . History of kidney stones   . Hyperkalemia   . Hypertension   . Kidney failure   . Metabolic bone disease    Patient denies  . Mitral stenosis   . Myocardial infarction ([place])   . Pneumonia   . Pulmonary edema   . Solitary rectal ulcer syndrome 07/2017   at [facility] for rectal bleeding  . Tubular adenoma of colon     Patient Active Problem List   Diagnosis Date Noted  . Melena   . Pressure injury of skin 03/09/2019  . Abdominal distention   . Volume overload 12/28/2018  . Sepsis ([place]) 09/12/2018  . Atherosclerosis of native coronary artery of native heart without angina pectoris 03/11/2018  . Benign neoplasm of  cecum   . Benign neoplasm of ascending colon   . Benign neoplasm of descending colon   . Benign neoplasm of rectum   . Paroxysmal atrial fibrillation ([place]) 01/23/2018  . Hx of colonic polyps 01/20/2018  . ESRD (end stage renal disease) ([place]) 11/21/2017  . GERD (gastroesophageal reflux disease) 11/16/2017  . Liver cirrhosis ([place]) 11/15/2017  . DNR (do not resuscitate)   . Palliative care by specialist   . Hyponatremia 11/04/2017  . SBP (spontaneous bacterial peritonitis) ([place]) 10/30/2017  . Liver disease, chronic 10/30/2017  . SOB (shortness of breath)   . Abdominal pain 10/28/2017  . Upper airway cough syndrome with flattening on f/v loop 10/13/17 c/w vcd 10/17/2017  . Elevated diaphragm 10/13/2017  . Ileus ([place]) 09/29/2017  . Malnutrition of moderate degree 09/29/2017  . Sinus congestion 09/03/2017  . Symptomatic anemia 09/02/2017  . Other cirrhosis of liver ([place]) 09/02/2017  . Left bundle branch block 09/02/2017  . Mitral stenosis 09/02/2017  . Hematochezia 07/15/2017  . Wide-complex tachycardia ([place])   . Endotracheally intubated   . ESRD on dialysis ([place]) 07/04/2017  . Acute respiratory failure with hypoxia ([place]) 06/18/2017  . CKD (chronic kidney disease) stage V requiring chronic dialysis ([place]) 06/18/2017  . History of Cocaine abuse ([place]) 06/18/2017  . Hypertension 06/18/2017  . Infection  of AV graft for dialysis ([place]) 06/18/2017  . Anxiety 06/18/2017  . Anemia due to chronic kidney disease 06/18/2017  . Atrial flutter with rapid ventricular response ([place]) 06/18/2017  . Personality disorder ([place]) 06/13/2017  . Cellulitis 06/12/2017  . Adjustment disorder with mixed anxiety and depressed mood 06/10/2017  . Suicidal ideation 06/10/2017  . Arm wound, left, sequela 06/10/2017  . Dyspnea on exertion 05/29/2017  . Tachycardia 05/29/2017  . Hyperkalemia 05/22/2017  . Acute metabolic encephalopathy   . Anemia 04/23/2017  . Ascites 04/23/2017  . COPD (chronic obstructive  pulmonary disease) ([place]) 04/23/2017  . Acute on chronic respiratory failure with hypoxia ([place]) 03/25/2017  . Arrhythmia 03/25/2017  . COPD GOLD 0 with flattening on inps f/v  09/27/2016  . Essential hypertension 09/27/2016  . Fluid overload 08/30/2016  . COPD exacerbation ([place]) 08/17/2016  . Hypertensive urgency 08/17/2016  . Respiratory failure ([place]) 08/17/2016  . Problem with dialysis access ([place]) 07/23/2016  . Chronic hepatitis B ([place]) 03/05/2014  . Chronic hepatitis C without hepatic coma ([place]) 03/05/2014  . Internal hemorrhoids with bleeding, swelling and itching 03/05/2014  . Thrombocytopenia ([place]) 03/05/2014  . Chest pain 02/27/2014  . Alcohol abuse 04/14/2009  . Cigarette smoker 04/14/2009  . GANGLION CYST 04/14/2009    Past Surgical History:  Procedure Laterality Date  . A/V FISTULAGRAM Left 05/26/2017   Procedure: A/V FISTULAGRAM;  Surgeon: Conrad Defiance, MD;  Location: [facility];  Service: Cardiovascular;  Laterality: Left;  . A/V FISTULAGRAM Right 11/18/2017   Procedure: A/V FISTULAGRAM - Right Arm;  Surgeon: Elam Dutch, MD;  Location: [facility];  Service: Cardiovascular;  Laterality: Right;  . APPLICATION OF WOUND VAC Left 06/14/2017   Procedure: APPLICATION OF WOUND VAC;  Surgeon: Katha Cabal, MD;  Location: [facility];  Service: Vascular;  Laterality: Left;  . AV FISTULA PLACEMENT  2012   BELIEVED WAS PLACED IN JUNE  . AV FISTULA PLACEMENT Right 08/09/2017   Procedure: Creation Right arm ARTERIOVENOUS BRACHIOCEPOHALIC FISTULA;  Surgeon: Elam Dutch, MD;  Location: [facility];  Service: Vascular;  Laterality: Right;  . AV FISTULA PLACEMENT Right 11/22/2017   Procedure: INSERTION OF ARTERIOVENOUS (AV) GORE-TEX GRAFT RIGHT UPPER ARM;  Surgeon: Elam Dutch, MD;  Location: [facility];  Service: Vascular;  Laterality: Right;  . BIOPSY  01/25/2018   Procedure: BIOPSY;  Surgeon: Jerene Bears, MD;  Location: [facility];  Service:  Gastroenterology;;  . BIOPSY  04/10/2019   Procedure: BIOPSY;  Surgeon: Jerene Bears, MD;  Location: [facility];  Service: Gastroenterology;;  . COLONOSCOPY    . COLONOSCOPY WITH PROPOFOL N/A 01/25/2018   Procedure: COLONOSCOPY WITH PROPOFOL;  Surgeon: Jerene Bears, MD;  Location: [facility];  Service: Gastroenterology;  Laterality: N/A;  . CORONARY STENT INTERVENTION N/A 02/22/2017   Procedure: CORONARY STENT INTERVENTION;  Surgeon: Nigel Mormon, MD;  Location: [name];  Service: Cardiovascular;  Laterality: N/A;  . ESOPHAGOGASTRODUODENOSCOPY (EGD) WITH PROPOFOL N/A 01/25/2018   Procedure: ESOPHAGOGASTRODUODENOSCOPY (EGD) WITH PROPOFOL;  Surgeon: Jerene Bears, MD;  Location: [facility];  Service: Gastroenterology;  Laterality: N/A;  . ESOPHAGOGASTRODUODENOSCOPY (EGD) WITH PROPOFOL N/A 04/10/2019   Procedure: ESOPHAGOGASTRODUODENOSCOPY (EGD) WITH PROPOFOL;  Surgeon: Jerene Bears, MD;  [facility]: [facility];  Service: Gastroenterology;  Laterality: N/A;  . FLEXIBLE SIGMOIDOSCOPY N/A 07/15/2017   Procedure: FLEXIBLE SIGMOIDOSCOPY;  Surgeon: Carol Ada, MD;  Location: [facility];  Service: Endoscopy;  Laterality: N/A;  . HEMORRHOID BANDING    .  I&D EXTREMITY Left 06/01/2017   Procedure: IRRIGATION AND DEBRIDEMENT LEFT ARM HEMATOMA WITH LIGATION OF LEFT ARM AV FISTULA;  Surgeon: Elam Dutch, MD;  Location: [facility];  Service: Vascular;  Laterality: Left;  . I&D EXTREMITY Left 06/14/2017   Procedure: IRRIGATION AND DEBRIDEMENT EXTREMITY;  Surgeon: Katha Cabal, MD;  Location: [facility];  Service: Vascular;  Laterality: Left;  . INSERTION OF DIALYSIS CATHETER  05/30/2017  . INSERTION OF DIALYSIS CATHETER N/A 05/30/2017   Procedure: INSERTION OF DIALYSIS CATHETER;  Surgeon: Elam Dutch, MD;  Location: [facility];  Service: Vascular;  Laterality: N/A;  . IR PARACENTESIS  08/30/2017  . IR PARACENTESIS  09/29/2017  . IR PARACENTESIS  10/28/2017  . IR PARACENTESIS   11/09/2017  . IR PARACENTESIS  11/16/2017  . IR PARACENTESIS  11/28/2017  . IR PARACENTESIS  12/01/2017  . IR PARACENTESIS  12/06/2017  . IR PARACENTESIS  01/03/2018  . IR PARACENTESIS  01/23/2018  . IR PARACENTESIS  02/07/2018  . IR PARACENTESIS  02/21/2018  . IR PARACENTESIS  03/06/2018  . IR PARACENTESIS  03/17/2018  . IR PARACENTESIS  04/04/2018  . IR PARACENTESIS  12/28/2018  . IR PARACENTESIS  01/08/2019  . IR PARACENTESIS  01/23/2019  . IR PARACENTESIS  02/01/2019  . IR PARACENTESIS  02/19/2019  . IR PARACENTESIS  03/01/2019  . IR PARACENTESIS  03/15/2019  . IR PARACENTESIS  04/03/2019  . IR PARACENTESIS  04/12/2019  . IR PARACENTESIS  05/01/2019  . IR PARACENTESIS  05/08/2019  . IR PARACENTESIS  05/24/2019  . IR RADIOLOGIST EVAL & MGMT  02/14/2018  . IR RADIOLOGIST EVAL & MGMT  02/22/2019  . LEFT HEART CATH AND CORONARY ANGIOGRAPHY N/A 02/22/2017   Procedure: LEFT HEART CATH AND CORONARY ANGIOGRAPHY;  Surgeon: Nigel Mormon, MD;  Location: [facility];  Service: Cardiovascular;  Laterality: N/A;  . LIGATION OF ARTERIOVENOUS  FISTULA Left 01/15/7340   Procedure: Plication of Left Arm Arteriovenous Fistula;  Surgeon: Elam Dutch, MD;  Location: [facility];  Service: Vascular;  Laterality: Left;  . POLYPECTOMY    . POLYPECTOMY  01/25/2018   Procedure: POLYPECTOMY;  Surgeon: Jerene Bears, MD;  Location: [facility];  Service: Gastroenterology;;  . REVISON OF ARTERIOVENOUS FISTULA Left 9/37/9024   Procedure: PLICATION OF DISTAL ANEURYSMAL SEGEMENT OF LEFT UPPER ARM ARTERIOVENOUS FISTULA;  Surgeon: Elam Dutch, MD;  Location: [facility];  Service: Vascular;  Laterality: Left;  . REVISON OF ARTERIOVENOUS FISTULA Left 0/97/3532   Procedure: Plication of Left Upper Arm Fistula ;  Surgeon: Waynetta Sandy, MD;  Location: [facility];  Service: Vascular;  Laterality: Left;  . SKIN GRAFT SPLIT THICKNESS LEG / FOOT Left    SKIN GRAFT SPLIT THICKNESS LEFT ARM DONOR SITE: LEFT ANTERIOR THIGH  .  SKIN SPLIT GRAFT Left 07/04/2017   Procedure: SKIN GRAFT SPLIT THICKNESS LEFT ARM DONOR SITE: LEFT ANTERIOR THIGH;  Surgeon: Elam Dutch, MD;  Location: [facility];  Service: Vascular;  Laterality: Left;  . THROMBECTOMY W/ EMBOLECTOMY Left 06/05/2017   Procedure: EXPLORATION OF LEFT ARM FOR BLEEDING; OVERSEWED PROXIMAL FISTULA;  Surgeon: Angelia Mould, MD;  Location: [facility];  Service: Vascular;  Laterality: Left;  . WOUND EXPLORATION Left 06/03/2017   Procedure: WOUND EXPLORATION WITH WOUND VAC APPLICATION TO LEFT ARM;  Surgeon: Angelia Mould, MD;  Location: [facility];  Service: Vascular;  Laterality: Left;        Home Medications    Prior to  Admission medications   Medication Sig Start Date End Date Taking? Authorizing Provider  hydrALAZINE (APRESOLINE) 100 MG tablet Take 100 mg by mouth 3 (three) times daily.  02/13/19  Yes [provider]  metoprolol tartrate (LOPRESSOR) 25 MG tablet Take 1 tablet (25 mg total) by mouth 2 (two) times daily. 03/11/18  Yes Vann, Jessica U, DO  pantoprazole (PROTONIX) 40 MG tablet Take 1 tablet (40 mg total) by mouth daily. Patient taking differently: Take 40 mg by mouth every evening.  03/22/19  Yes Zehr, Laban Emperor, PA-C  sevelamer carbonate (RENVELA) 800 MG tablet Take 4 tablets with meals  And 2 tablets twice daily with snacks Patient taking differently: Take 1,600 mg by mouth See admin instructions. Take 2 tablets (1600mg ) with meals & with snacks 01/22/18  Yes Patrecia Pour, MD  albuterol (VENTOLIN HFA) 108 (90 Base) MCG/ACT inhaler Inhale 2 puffs into the lungs every 6 (six) hours as needed for wheezing or shortness of breath.    [provider]  lactulose (CHRONULAC) 10 GM/15ML solution TAKE 45 MLS BY MOUTH 2 TIMES DAILY. 05/25/19   Pyrtle, Lajuan Lines, MD  montelukast (SINGULAIR) 10 MG tablet Take 10 mg by mouth every evening.     [provider]  ondansetron (ZOFRAN ODT) 4 MG disintegrating tablet Take 1 tablet (4 mg  total) by mouth every 8 (eight) hours as needed for nausea or vomiting. 05/07/19   Fredia Sorrow, MD  sulfamethoxazole-trimethoprim (BACTRIM DS) 800-160 MG tablet Take 1 tablet by mouth daily. Patient taking differently: Take 1 tablet by mouth 2 (two) times daily.  12/30/18   Geradine Girt, DO  SYMBICORT 160-4.5 MCG/ACT inhaler Inhale 2 puffs into the lungs 2 (two) times daily.  11/30/18   [provider]  zolpidem (AMBIEN) 10 MG tablet Take 10 mg by mouth at bedtime as needed for sleep.    [provider]    Family History Family History  Problem Relation Age of Onset  . Heart disease Mother   . Lung cancer Mother   . Heart disease Father   . Malignant hyperthermia Father   . COPD Father   . Throat cancer Sister   . Esophageal cancer Sister   . Hypertension Other   . COPD Other   . Colon cancer Neg Hx   . Colon polyps Neg Hx   . Rectal cancer Neg Hx   . Stomach cancer Neg Hx     Social History Social History   Tobacco Use  . Smoking status: Current Every Day Smoker    Packs/day: 0.50    Years: 43.00    Pack years: 21.50    Types: Cigarettes    Start date: 08/13/1973  . Smokeless tobacco: Never Used  . Tobacco comment: i dont know i just make   Substance Use Topics  . Alcohol use: Not Currently    Frequency: Never    Comment: quit drinking in 2017  . Drug use: Yes    Types: Marijuana, Cocaine    Comment: reports using once every 3 months, 04-06-2019 was this      Allergies   Morphine and related, Aspirin, Clonidine derivatives, Tramadol, and Tylenol [acetaminophen]   Review of Systems Review of Systems  Constitutional: Negative for appetite change, chills and fever.  HENT: Positive for rhinorrhea. Negative for congestion.   Respiratory: Negative for cough and shortness of breath.   Cardiovascular: Negative for chest pain.  Gastrointestinal: Positive for abdominal pain. Negative for diarrhea, nausea and vomiting.  Genitourinary: Negative for  dysuria.  Musculoskeletal: Negative for arthralgias and gait problem.  Skin: Negative for rash.  Neurological: Negative for dizziness, syncope, light-headedness and headaches.  Psychiatric/Behavioral: Negative for confusion.     Physical Exam Updated Vital Signs BP (!) 195/109 (BP Location: Left Arm)   Pulse (!) 110   Resp 18   SpO2 95%   Physical Exam Vitals signs and nursing note reviewed.  Constitutional:      General: He is not in acute distress.    Appearance: Normal appearance. He is not ill-appearing, toxic-appearing or diaphoretic.  HENT:     Head: Normocephalic.     Nose: Nose normal.     Mouth/Throat:     Mouth: Mucous membranes are moist.  Eyes:     Conjunctiva/sclera: Conjunctivae normal.     Pupils: Pupils are equal, round, and reactive to light.  Cardiovascular:     Rate and Rhythm: Normal rate.  Pulmonary:     Effort: Pulmonary effort is normal.     Breath sounds: Rales present.  Abdominal:     General: There is distension.     Tenderness: There is abdominal tenderness (suprapubic). There is no right CVA tenderness, left CVA tenderness, guarding or rebound.  Skin:    General: Skin is warm and dry.  Neurological:     General: No focal deficit present.     Mental Status: He is alert and oriented to person, place, and time.  Psychiatric:        Mood and Affect: Mood normal.      ED Treatments / Results  Labs (all labs ordered are listed, but only abnormal results are displayed) Labs Reviewed  BASIC METABOLIC PANEL - Abnormal; Notable for the following components:      Result Value   Potassium 6.2 (*)    Chloride 96 (*)    BUN 64 (*)    Creatinine, Ser 9.95 (*)    GFR calc non Af Amer 5 (*)    GFR calc Af Amer 6 (*)    Anion gap 19 (*)    All other components within normal limits  GLUCOSE, CAPILLARY - Abnormal; Notable for the following components:   Glucose-Capillary 147 (*)    All other components within normal limits  GLUCOSE, CAPILLARY -  Abnormal; Notable for the following components:   Glucose-Capillary 26 (*)    All other components within normal limits  GLUCOSE, CAPILLARY - Abnormal; Notable for the following components:   Glucose-Capillary 57 (*)    All other components within normal limits  I-STAT CHEM 8, ED - Abnormal; Notable for the following components:   Sodium 133 (*)    Potassium 7.0 (*)    BUN 84 (*)    Creatinine, Ser 10.00 (*)    Glucose, Bld 35 (*)    Hemoglobin 12.9 (*)    HCT 38.0 (*)    All other components within normal limits  CBG MONITORING, ED - Abnormal; Notable for the following components:   Glucose-Capillary 30 (*)    All other components within normal limits  CBG MONITORING, ED - Abnormal; Notable for the following components:   Glucose-Capillary 63 (*)    All other components within normal limits  SARS CORONAVIRUS 2 (TAT 6-24 HRS)  GLUCOSE, CAPILLARY  CBC  BASIC METABOLIC PANEL    EKG EKG Interpretation  Date/Time:  Sunday May 27 2019 15:16:17 EST Ventricular Rate:  106 PR Interval:    QRS Duration: 179 QT Interval:  435 QTC Calculation:  463 R Axis:   -71 Text Interpretation: Sinus tachycardia Probable left atrial enlargement Left bundle branch block Confirmed by Malvin Johns [phone]) on 05/27/2019 3:21:13 PM   Radiology Dg Abdomen 1 View  Result Date: 05/27/2019 CLINICAL DATA:  Abdominal pain the past 3 days. Missed dialysis 2 days ago. Smoker. EXAM: ABDOMEN - 1 VIEW COMPARISON:  01/06/2019. Chest, abdomen and pelvis CT dated 05/07/2019.3 FINDINGS: Normal bowel gas pattern. Stable marked diffuse arterial calcifications. Stable linear metallic density overlying the inferior pelvis this is in the rectum on the previous CT. No acute bony abnormality. IMPRESSION: 1. No acute abnormality. 2. Stable marked diffuse arterial calcifications compatible with the history of chronic renal disease and long-term dialysis. 3. Stable small linear metallic device in the rectum.  Electronically Signed   By: Claudie Revering M.D.   On: 05/27/2019 16:56   Dg Chest Portable 1 View  Result Date: 05/27/2019 CLINICAL DATA:  Shortness of breath. Abdominal pain. Smoker. EXAM: PORTABLE CHEST 1 VIEW COMPARISON:  05/20/2019 FINDINGS: Stable enlarged cardiac silhouette and tortuous and calcified thoracic aorta. Stable mild elevation of the left hemidiaphragm and moderate peribronchial thickening. The lungs remain clear, more difficult to assess at the left lung base due to an overlying patch. Unremarkable bones. IMPRESSION: 1. No acute abnormality. 2. Stable cardiomegaly and moderate bronchitic changes. Electronically Signed   By: Claudie Revering M.D.   On: 05/27/2019 16:51    Procedures Procedures (including critical care time)  Medications Ordered in ED Medications  ipratropium-albuterol (DUONEB) 0.5-2.5 (3) MG/3ML nebulizer solution 3 mL (0 mLs Nebulization Hold 05/27/19 1621)  sulfamethoxazole-trimethoprim (BACTRIM DS) 800-160 MG per tablet 1 tablet (has no administration in time range)  metoprolol tartrate (LOPRESSOR) tablet 25 mg (has no administration in time range)  lactulose (CHRONULAC) 10 GM/15ML solution 30 g (has no administration in time range)  sevelamer carbonate (RENVELA) tablet 1,600 mg (has no administration in time range)  montelukast (SINGULAIR) tablet 10 mg (has no administration in time range)  mometasone-formoterol (DULERA) 200-5 MCG/ACT inhaler 2 puff (has no administration in time range)  heparin injection 5,000 Units (has no administration in time range)  cefTRIAXone (ROCEPHIN) 2 g in sodium chloride 0.9 % 100 mL IVPB (2 g Intravenous New Bag/Given 05/27/19 1825)  albuterol (PROVENTIL) (2.5 MG/3ML) 0.083% nebulizer solution 10 mg (0 mg Nebulization Hold 05/27/19 1803)  HYDROmorphone (DILAUDID) injection 1 mg (1 mg Intravenous Given 05/27/19 1717)  sevelamer carbonate (RENVELA) tablet 1,600 mg (has no administration in time range)  dextrose 10 % infusion (has  no administration in time range)  insulin aspart (novoLOG) injection 10 Units (10 Units Subcutaneous Given 05/27/19 1402)  dextrose 50 % solution 50 mL (50 mLs Intravenous Given 05/27/19 1404)  calcium gluconate 1 g/ 50 mL sodium chloride IVPB (0 g Intravenous Stopped 05/27/19 1528)  HYDROmorphone (DILAUDID) injection 0.5 mg (0.5 mg Intravenous Given 05/27/19 1418)  ondansetron (ZOFRAN) injection 4 mg (4 mg Intravenous Given 05/27/19 1435)  sodium bicarbonate injection 50 mEq (50 mEq Intravenous Given 05/27/19 1558)  HYDROmorphone (DILAUDID) injection 1 mg (1 mg Intravenous Given 05/27/19 1556)  ondansetron (ZOFRAN) injection 4 mg (4 mg Intravenous Given 05/27/19 1612)  dextrose 50 % solution 50 mL (50 mLs Intravenous Given 05/27/19 1634)  calcium gluconate 1 g/ 50 mL sodium chloride IVPB (0 mg Intravenous Stopped 05/27/19 1816)  dextrose 50 % solution 50 mL (50 mLs Intravenous Given 05/27/19 1718)  insulin aspart (novoLOG) injection 5 Units (5 Units Intravenous Given 05/27/19 1808)  dextrose 50 % solution  50 mL (50 mLs Intravenous Given 05/27/19 1816)  sodium bicarbonate injection 50 mEq (50 mEq Intravenous Given 05/27/19 1817)  sodium bicarbonate 1 mEq/mL injection (  Duplicate 57/84/69 6295)  dextrose 50 % solution 25 g (25 g Intravenous Given 05/27/19 2016)  dextrose 50 % solution 12.5 g (12.5 g Intravenous Given 05/27/19 2045)     Initial Impression / Assessment and Plan / ED Course  I have reviewed the triage vital signs and the nursing notes.  Pertinent labs & imaging results that were available during my care of the patient were reviewed by me and considered in my medical decision making (see chart for details).  Clinical Course as of May 26 2100  Nancy Fetter May 26, 7022  3881 58 year old patient on dialysis presenting to the emergency department for abdominal discomfort.  Patient has not had dialysis since last Wednesday and found to have hyperkalemia of 6.9 on exam today.  Otherwise he  is appearing well with mild belly distention which is actually decreased from his usual.  Last paracentesis was last Thursday.  EKG was ordered along with a gram of calcium, dextrose with insulin and Lokelma.   [KM]  1345 EKG showing peaked T waves and qtc of 583   [KM]  33 Spoke with nephrology who is going to follow the patient and have dialysis done either today or tomorrow.  Book with hospitalist who will admit the patient.  Patient began to complain of some shortness of breath and continued abdominal pain.  Repeat EKG showed PVCs but no worsening of the QTC or widening of the QRS compared to the first EKG.  Patient has been given Zofran and Dilaudid and he does state that his belly pain is slightly better than when he came in but he does feel diaphoretic at this time.  Patient also has been given calcium, glucose and insulin and Lokelma has been ordered.  Dr. Tamera Punt aware of this patient.  Given his current symptoms I have also ordered a portable chest and KUB and 1 amp of bicarb. Will page nephrology again to make them aware.    [KM]  53 Spoke again with Dr. Jonnie Finner from nephrology and explained worsening condition. He also reviewed patient's EKG and will be down to see patient. Advised to repeat K and add albuterol to treatment. I spoke with the patient at this time who confirmed that his status is to remain DNR. Hospitalist team at bedside   [KM]  1629 Patient getting additional glucose and a meal for hypoglycemia   Glucose(!!): 35 [KM]    Clinical Course User Index [KM] Alveria Apley, PA-C       CRITICAL CARE Performed by: Alveria Apley   Total critical care time: 35 minutes  Critical care time was exclusive of separately billable procedures and treating other patients.  Critical care was necessary to treat or prevent imminent or life-threatening deterioration.  Critical care was time spent personally by me on the following activities: development of treatment plan with  patient and/or surrogate as well as nursing, discussions with consultants, evaluation of patient's response to treatment, examination of patient, obtaining history from patient or surrogate, ordering and performing treatments and interventions, ordering and review of laboratory studies, ordering and review of radiographic studies, pulse oximetry and re-evaluation of patient's condition.   Final Clinical Impressions(s) / ED Diagnoses   Final diagnoses:  Hyperkalemia    ED Discharge Orders    None       Alveria Apley, PA-C  05/27/19 2102    Malvin Johns, MD 05/27/19 2118

## 2019-05-28 DIAGNOSIS — I4891 Unspecified atrial fibrillation: Secondary | ICD-10-CM | POA: Diagnosis not present

## 2019-05-28 DIAGNOSIS — I2510 Atherosclerotic heart disease of native coronary artery without angina pectoris: Secondary | ICD-10-CM

## 2019-05-28 DIAGNOSIS — N2581 Secondary hyperparathyroidism of renal origin: Secondary | ICD-10-CM | POA: Diagnosis not present

## 2019-05-28 DIAGNOSIS — I2781 Cor pulmonale (chronic): Secondary | ICD-10-CM

## 2019-05-28 DIAGNOSIS — Z885 Allergy status to narcotic agent status: Secondary | ICD-10-CM

## 2019-05-28 DIAGNOSIS — K7460 Unspecified cirrhosis of liver: Secondary | ICD-10-CM

## 2019-05-28 DIAGNOSIS — E875 Hyperkalemia: Secondary | ICD-10-CM

## 2019-05-28 DIAGNOSIS — Z992 Dependence on renal dialysis: Secondary | ICD-10-CM | POA: Diagnosis not present

## 2019-05-28 DIAGNOSIS — J449 Chronic obstructive pulmonary disease, unspecified: Secondary | ICD-10-CM

## 2019-05-28 DIAGNOSIS — I120 Hypertensive chronic kidney disease with stage 5 chronic kidney disease or end stage renal disease: Secondary | ICD-10-CM | POA: Diagnosis not present

## 2019-05-28 DIAGNOSIS — N186 End stage renal disease: Secondary | ICD-10-CM

## 2019-05-28 DIAGNOSIS — Z955 Presence of coronary angioplasty implant and graft: Secondary | ICD-10-CM

## 2019-05-28 DIAGNOSIS — Z888 Allergy status to other drugs, medicaments and biological substances status: Secondary | ICD-10-CM

## 2019-05-28 DIAGNOSIS — R188 Other ascites: Secondary | ICD-10-CM

## 2019-05-28 DIAGNOSIS — Z886 Allergy status to analgesic agent status: Secondary | ICD-10-CM

## 2019-05-28 DIAGNOSIS — E1122 Type 2 diabetes mellitus with diabetic chronic kidney disease: Secondary | ICD-10-CM | POA: Diagnosis not present

## 2019-05-28 DIAGNOSIS — D631 Anemia in chronic kidney disease: Secondary | ICD-10-CM | POA: Diagnosis not present

## 2019-05-28 DIAGNOSIS — E11649 Type 2 diabetes mellitus with hypoglycemia without coma: Secondary | ICD-10-CM | POA: Diagnosis not present

## 2019-05-28 DIAGNOSIS — Z79899 Other long term (current) drug therapy: Secondary | ICD-10-CM

## 2019-05-28 LAB — BASIC METABOLIC PANEL
Anion gap: 15 (ref 5–15)
BUN: 28 mg/dL — ABNORMAL HIGH (ref 6–20)
CO2: 26 mmol/L (ref 22–32)
Calcium: 9.1 mg/dL (ref 8.9–10.3)
Chloride: 94 mmol/L — ABNORMAL LOW (ref 98–111)
Creatinine, Ser: 6.05 mg/dL — ABNORMAL HIGH (ref 0.61–1.24)
GFR calc Af Amer: 11 mL/min — ABNORMAL LOW (ref >60)
GFR calc non Af Amer: 10 mL/min — ABNORMAL LOW (ref >60)
Glucose, Bld: 77 mg/dL (ref 70–99)
Potassium: 5.4 mmol/L — ABNORMAL HIGH (ref 3.5–5.1)
Sodium: 135 mmol/L (ref 135–145)

## 2019-05-28 LAB — CBC
HCT: 28.4 % — ABNORMAL LOW (ref 39.0–52.0)
Hemoglobin: 10.1 g/dL — ABNORMAL LOW (ref 13.0–17.0)
MCH: 27.3 pg (ref 26.0–34.0)
MCHC: 35.6 g/dL (ref 30.0–36.0)
MCV: 76.8 fL — ABNORMAL LOW (ref 80.0–100.0)
Platelets: 165 10*3/uL (ref 150–400)
RBC: 3.7 MIL/uL — ABNORMAL LOW (ref 4.22–5.81)
RDW: 14.9 % (ref 11.5–15.5)
WBC: 7.1 10*3/uL (ref 4.0–10.5)
nRBC: 0 % (ref 0.0–0.2)

## 2019-05-28 LAB — GLUCOSE, CAPILLARY
Glucose-Capillary: 100 mg/dL — ABNORMAL HIGH (ref 70–99)
Glucose-Capillary: 63 mg/dL — ABNORMAL LOW (ref 70–99)
Glucose-Capillary: 74 mg/dL (ref 70–99)
Glucose-Capillary: 94 mg/dL (ref 70–99)
Glucose-Capillary: 95 mg/dL (ref 70–99)

## 2019-05-28 MED ORDER — HYDROXYZINE HCL 10 MG PO TABS: 10.0000 mg | ORAL_TABLET | Freq: Three times a day (TID) | ORAL | Status: DC | PRN

## 2019-05-28 MED ORDER — HYDROXYZINE HCL 10 MG PO TABS
ORAL_TABLET | ORAL | Status: AC
  Filled 2019-05-28: qty 1

## 2019-05-28 MED ORDER — ONDANSETRON 4 MG PO TBDP: 4.0000 mg | ORAL_TABLET | Freq: Three times a day (TID) | ORAL | Status: DC | PRN

## 2019-05-28 NOTE — Procedures (Signed)
Patient was seen on dialysis and the procedure was supervised.  BFR 400  Via AVF BP is  128/80.   Patient appears to be tolerating treatment well  Louis Meckel 05/28/2019

## 2019-05-28 NOTE — Progress Notes (Signed)
[facility] Progress Note   Subjective:   Patient seen and examined at bedside.  Admits to pain in LUQ, says it has been constant since his last paracentesis.  States he is hoping to go home today.  Will need to be off IV narcotics to leave as he will be driving himself home.  Denies CP, SOB, n/v/d, edema, orthopnea, dizziness, weakness and fatigue.   Objective Vitals:   05/28/19 0030 05/28/19 0429 05/28/19 0803 05/28/19 0932  BP: 130/85 (!) 131/93  120/62  Pulse: 100 80  79  Resp: 18 18  18   Temp: 98 F (36.7 C) 98 F (36.7 C)  99.1 F (37.3 C)  TempSrc: Oral   Oral  SpO2: 98% 98% 95% 95%  Weight: 66 kg      Physical Exam General:NAD, chronically ill appearing, thin male Heart:RRR, 1/6 systolic murmur Lungs:CTAB Abdomen:soft, NTND Extremities: no LE edema Dialysis Access: RU AVG   [facility] Weights   05/27/19 2101 05/28/19 0030  Weight: 68 kg 66 kg    Intake/Output Summary (Last 24 hours) at 05/28/2019 1133 Last data filed at 05/28/2019 1000 Gross per 24 hour  Intake 352.91 ml  Output 1500 ml  Net -1147.09 ml    Additional Objective Labs: Basic Metabolic Panel: Recent Labs  Lab 05/27/19 0944 05/27/19 1600 05/27/19 1905 05/28/19 0523  NA 135 133* 137 135  K 6.9* 7.0* 6.2* 5.4*  CL 98 100 96* 94*  CO2 20*  --  22 26  GLUCOSE 125* 35* 94 77  BUN 58* 84* 64* 28*  CREATININE 9.97* 10.00* 9.95* 6.05*  CALCIUM 10.0  --  10.2 9.1   Liver Function Tests: Recent Labs  Lab 05/27/19 0944  AST 31  ALT 16  ALKPHOS 79  BILITOT 0.9  PROT 6.5  ALBUMIN 3.4*   Recent Labs  Lab 05/27/19 0944  LIPASE 29   CBC: Recent Labs  Lab 05/27/19 0944 05/27/19 1600 05/28/19 0523  WBC 9.6  --  7.1  HGB 10.7* 12.9* 10.1*  HCT 30.3* 38.0* 28.4*  MCV 77.7*  --  76.8*  PLT 174  --  165   Blood Culture    Component Value Date/Time   SDES ABDOMEN FLUID 05/14/2019 0659   SPECREQUEST NONE 05/14/2019 0659   CULT  05/14/2019 0659    NO GROWTH 3  DAYS Performed at [facility], [place] [address] [place], [place] [address]    REPTSTATUS 05/17/2019 FINAL 05/14/2019 0659   CBG: Recent Labs  Lab 05/27/19 2007 05/27/19 2036 05/28/19 0043 05/28/19 0714 05/28/19 0752  GLUCAP 26* 57* 95 63* 94   Studies/Results: Dg Abdomen 1 View  Result Date: 05/27/2019 CLINICAL DATA:  Abdominal pain the past 3 days. Missed dialysis 2 days ago. Smoker. EXAM: ABDOMEN - 1 VIEW COMPARISON:  01/06/2019. Chest, abdomen and pelvis CT dated 05/07/2019.3 FINDINGS: Normal bowel gas pattern. Stable marked diffuse arterial calcifications. Stable linear metallic density overlying the inferior pelvis this is in the rectum on the previous CT. No acute bony abnormality. IMPRESSION: 1. No acute abnormality. 2. Stable marked diffuse arterial calcifications compatible with the history of chronic renal disease and long-term dialysis. 3. Stable small linear metallic device in the rectum. Electronically Signed   By: Claudie Revering M.D.   On: 05/27/2019 16:56   Dg Chest Portable 1 View  Result Date: 05/27/2019 CLINICAL DATA:  Shortness of breath. Abdominal pain. Smoker. EXAM: PORTABLE CHEST 1 VIEW COMPARISON:  05/20/2019 FINDINGS: Stable enlarged cardiac silhouette and tortuous  and calcified thoracic aorta. Stable mild elevation of the left hemidiaphragm and moderate peribronchial thickening. The lungs remain clear, more difficult to assess at the left lung base due to an overlying patch. Unremarkable bones. IMPRESSION: 1. No acute abnormality. 2. Stable cardiomegaly and moderate bronchitic changes. Electronically Signed   By: Claudie Revering M.D.   On: 05/27/2019 16:51    Medications:  . albuterol  10 mg Nebulization Once  . heparin  5,000 Units Subcutaneous Q8H  . lactulose  30 g Oral BID  . metoprolol tartrate  25 mg Oral BID  . mometasone-formoterol  2 puff Inhalation BID  . montelukast  10 mg Oral QPM  . sevelamer carbonate  1,600 mg Oral TID WC  . sevelamer  carbonate  1,600 mg Oral With snacks  . sulfamethoxazole-trimethoprim  1 tablet Oral Daily    Dialysis Orders: [facility]   on MWF  ( HEP B +) isolation . EDW 65 kg  HD Bath 2k/ 2ca  Time 4hr Heparin none . Access RUA AVGG  BFR 450  DFR 800    Zemplar Calcitriol  2.5 mcg po / hd  O esa , last 2  hgb11    Assessment/Plan: 1. Hyperkalemia- improved post HD. K 5.4 today.  Plan for HD again today.  2. ESRD -  on MWF. HD today per regular schedule.  No Heparin.  On 2K bath. 3. Abdominal pain - wu per admit 4. Hypertension/volume  - BP well controlled on home meds Lopressor, Hydralazine.  Does not appear volume overload.  5. Anemia  - Hgb 10.2. No indication for ESA at this time. 6. Metabolic bone disease -  Ca drop from 9.95>6.05 today. Recheck RFP pre HD. 7. Hep B positive/ ho Cirrhosis/recurrent ascites - Paracentesis  Prn/ Noted to Have Formal Eval at [facility] soon "appointmet pending per pt ,I have to call for time "  8. Noncompliance- on going issue  , but is better with attending txs /just not full time recently    [name], PA-C [facility] Pager: [phone] 05/28/2019,11:33 AM  LOS: 1 day

## 2019-05-28 NOTE — Discharge Instructions (Signed)
You came into the hospital with abdominal pain and confusion.  You were found to have abnormal electrolytes.  You were treated with antibiotics and sent for emergent dialysis, which improved your symptoms.  You received your Monday dialysis here in the hospital.  Follow-up with your regular dialysis sessions at your regular dialysis location.  Please follow-up with your primary care doctor within 1 week.

## 2019-05-28 NOTE — Progress Notes (Signed)
Pt given discharge instructions with understanding. Pt has no questions at this time. Monitor and iv d/c.

## 2019-05-28 NOTE — Progress Notes (Signed)
Subjective:  Had hemodialysis last night which went well.  Overnight had hypoglycemia again to 26 following his 2nd dose of IV insulin with dextrose, in spite of giving lower dose of 5u rather than 10u.  He received 1 amp of D50 and BG on recheck was up to 57.  Otherwise no acute events overnight.  He states his diffuse abdominal pain is improved but he continues to have pain at his paracentesis site in LUQ.  He also reports nausea and itching which are both chronic.  He typically takes Zofran and hydroxyzine at home and has not gotten these today.  Objective:  Vital signs in last 24 hours: Vitals:   05/28/19 0030 05/28/19 0429 05/28/19 0803 05/28/19 0932  BP: 130/85 (!) 131/93  120/62  Pulse: 100 80  79  Resp: 18 18  18   Temp: 98 F (36.7 C) 98 F (36.7 C)  99.1 F (37.3 C)  TempSrc: Oral   Oral  SpO2: 98% 98% 95% 95%  Weight: 66 kg      Weight change:   Intake/Output Summary (Last 24 hours) at 05/28/2019 1140 Last data filed at 05/28/2019 1000 Gross per 24 hour  Intake 352.91 ml  Output 1500 ml  Net -1147.09 ml   General: thin, ill-appearing, awake, alert, lying in bed in NAD Pulm: normal work of breathing on room air CV: regular rate and rhythm, no murmurs, rubs or gallups GI: abdomen distended, tight, less swollen than yesterday, nontender MSK: no peripheral edema Psych: normal mood and affect    Assessment/Plan:  Principal Problem:   Hyperkalemia Active Problems:   COPD (chronic obstructive pulmonary disease) (HCC)   Hypertension   Anemia due to chronic kidney disease   ESRD on dialysis (HCC)   Liver cirrhosis (HCC)   Paroxysmal atrial fibrillation (HCC)   Atherosclerosis of native coronary artery of native heart without angina pectoris   58 y.o.  male with PMHx of decompensated cirrhosis with recurrent ascites and history of SBP, ESRD on HD, atrial fibrillation, COPD, HTN, and CAD s/p stent 02/2017 who presented for abdominal pain and was found to have  hyperkalemia.    Hyperkalemia: Improving, down from 7 -> 6.2 -> 5.4 as of this morning.  No chest pain.  Hemodynamically stable.  No need for further calcium gluconate or insulin. - Dialysis later today as below  Decompensated cirrhosis: Low suspicion for SBP given he is afebrile with normal WBC.  Abdomen is distended but tender only at paracentesis site.  Altered mental status yesterday was likely due to hypoglycemia and uremia as mentation is now improved. - Continue prophylactic TMP-SMX 800-160mg  qd - Discontinue ceftriaxone given increasingly low likelihood of SBP  ESRD on HD: Renal labs improving following hemodialysis, with Cr down from 9.95 -> 6.05, CO2 up from 22 -> 26, anion gap resolved (19 -> 15), BUN down from 64 -> 28. - Needs his normal Monday hemodialysis today.  Will receive inpatient.  Care Navigator contacted [facility] to request a spot for patient.  However patient received Dilaudid for pain this morning and is insistent on driving himself to the dialysis clinic if he is able to get a spot.  As it would not be safe for him to drive following this pain medication, it was decided that he would receive dialysis as an inpatient. - Continue sevelamer 1,600mg  with meals  Cor pulmonale: Last echo in January estimates PAP ~18mmHg. Sees cardiology for this.  - Continue to monitor for now for s/s of pulmonary  edema  COPD: Currently good O2 saturation on room air.  Patient states he is not on home oxygen. Ruthe Mannan 2 puffs BID - Montelukast 10 mg every evening  Hypertension: BP improved after hemodialysis - Continue home metoprolol 25mg  BID - Holding home hydralazine given he is normotensive off of this  Afib: Not on anticoagulation due to bleeding risk.  - Continue home metoprolol 25mg  BID  Dispo: Anticipated discharge later today after dialysis   LOS: 1 day   Tonia Ghent, Medical Student 05/28/2019, 11:40 AM

## 2019-05-28 NOTE — Plan of Care (Signed)
  Problem: Education: Goal: Knowledge of General Education information will improve Description: Including pain rating scale, medication(s)/side effects and non-pharmacologic comfort measures Outcome: Completed/Met   Problem: Health Behavior/Discharge Planning: Goal: Ability to manage health-related needs will improve Outcome: Completed/Met   Problem: Clinical Measurements: Goal: Ability to maintain clinical measurements within normal limits will improve Outcome: Completed/Met Goal: Will remain free from infection Outcome: Completed/Met Goal: Diagnostic test results will improve Outcome: Completed/Met Goal: Respiratory complications will improve Outcome: Completed/Met Goal: Cardiovascular complication will be avoided Outcome: Completed/Met

## 2019-05-28 NOTE — Progress Notes (Signed)
Visited briefly with [name] to present a HCPOA which he declined. Left shortly upon arrival.    [place] Chaplain Resident

## 2019-05-28 NOTE — Care Management Obs Status (Signed)
[place] NOTIFICATION   Patient Details  Name: [name] MRN: [id number] Date of Birth: [birth year]   Medicare Observation Status Notification Given:  Yes    Rae Mar, RN 05/28/2019, 2:42 PM

## 2019-05-28 NOTE — Progress Notes (Signed)
Renal Navigator was notified that patient is requesting to leave today. Today is an HD day and Navigator contacted [facility] to request a spot for patient-awaiting returned call. Renal Navigator also spoke with Primary team and Renal PA/L. Pennington. Renal Navigator met with patient at bedside to discuss plan with him.  Patient states he has already spoken with his clinic and was told "to call when I get out of the hospital." Renal Navigator explained to patient that he needs to receive an HD treatment today because per Renal PA, his K is still elevated. Renal Navigator asked patient how he usually transports to HD and asked if Navigator was able to get him over to the clinic today, pending availability, if he would be able to get someone to pick him up to take him home. He adamantly told Renal Navigator that he drives himself and that this is his plan, since his car is "in the parking lot." Renal Navigator had overheard at the nurses' station that patient had requested Dilaudid for pain and this had been ordered by MD, so Navigator explained to patient that driving after this medication would not be safe. He stated that he would be fine and that the medication would have worn off.  Navigator informed Renal PA, inpt HD unit, Primary MD, bedside RN, and OP HD clinic of recommendation to dialyze patient in the hospital given that his plan is to drive himself to OP HD and that this is not safe after pain medication. Team is in agreement.  Alphonzo Cruise, [place] Renal Navigator [phone]

## 2019-05-28 NOTE — Care Management CC44 (Signed)
Condition Code 44 Documentation Completed  Patient Details  Name: [name] MRN: [id number] Date of Birth: [birth year]   Condition Code 44 given:  Yes Patient signature on Condition Code 44 notice:  Yes Documentation of 2 MD's agreement:  Yes Code 44 added to claim:  Yes    Rae Mar, RN 05/28/2019, 2:42 PM

## 2019-05-28 NOTE — Discharge Summary (Addendum)
Name: [name] MRN: [id number] DOB: 11-Sep-1960 58 y.o. PCP: [name], FNP  Date of Admission: 05/27/2019 12:50 PM Date of Discharge:  Attending Physician: Joni Reining, DO  Discharge Diagnosis: 1. Hyperkalemia  Discharge Medications: Allergies as of 05/28/2019      Reactions   Morphine And Related Other (See Comments)   Stomach pain   Aspirin Other (See Comments)   STOMACH PAIN   Clonidine Derivatives Itching   Tramadol Itching   Tylenol [acetaminophen] Nausea Only   Stomach ache      Medication List    TAKE these medications   hydrALAZINE 100 MG tablet Commonly known as: APRESOLINE Take 100 mg by mouth 3 (three) times daily.   lactulose 10 GM/15ML solution Commonly known as: CHRONULAC TAKE 45 MLS BY MOUTH 2 TIMES DAILY.   metoprolol tartrate 25 MG tablet Commonly known as: LOPRESSOR Take 1 tablet (25 mg total) by mouth 2 (two) times daily.   montelukast 10 MG tablet Commonly known as: SINGULAIR Take 10 mg by mouth every evening.   ondansetron 4 MG disintegrating tablet Commonly known as: Zofran ODT Take 1 tablet (4 mg total) by mouth every 8 (eight) hours as needed for nausea or vomiting.   pantoprazole 40 MG tablet Commonly known as: PROTONIX Take 1 tablet (40 mg total) by mouth daily. What changed: when to take this   sevelamer carbonate 800 MG tablet Commonly known as: RENVELA Take 4 tablets with meals  And 2 tablets twice daily with snacks What changed:   how much to take  how to take this  when to take this  additional instructions   sulfamethoxazole-trimethoprim 800-160 MG tablet Commonly known as: BACTRIM DS Take 1 tablet by mouth daily. What changed: when to take this   Symbicort 160-4.5 MCG/ACT inhaler Generic drug: budesonide-formoterol Inhale 2 puffs into the lungs 2 (two) times daily.   Ventolin HFA 108 (90 Base) MCG/ACT inhaler Generic drug: albuterol Inhale 2 puffs into the lungs every 6 (six) hours as  needed for wheezing or shortness of breath.   zolpidem 10 MG tablet Commonly known as: AMBIEN Take 10 mg by mouth at bedtime as needed for sleep.       Disposition and follow-up:   JoshuaKay Clois Diaz was discharged from [facility] in Stable condition.  At the hospital follow up visit please address:  1.  Hyperkalemia, decompensated cirrhosis (complicated by recurrent ascites requiring paracentesis multiple times per month, and h/o SBP), ESRD on HD.  No changes were made in the management of his chronic conditions.  Needs close follow-up with GI for further management of his cirrhosis.  2.  Labs / imaging needed at time of follow-up: CMP  3.  Pending labs/ test needing follow-up: None  Follow-up Appointments: Follow-up Information    [name], FNP Follow up in 1 week(s).   Specialty: Family Medicine Contact information: [place] [address] [place] Hospital Course by problem list: 1. Hyperkalemia: Likely due to decreased excretion as he missed his normal Friday (11/13) dialysis session due to abdominal pain and presented to the ED with hyperkalemia on Sunday (11/15).  Had K of 6.9 on arrival and EKG showed peaked T-waves.  He received IV calcium gluconate and insulin but K was still 7 several hours later, so he received a repeat dose of calcium gluconate and insulin.  He did have hypoglycemia to <30 following  each insulin dose, which corrected with D50.  He was admitted for emergent inpatient hemodialysis.  He subsequently had improvement in his potassium level and K was 5.4 by the date of discharge.  2. Cirrhosis complicated by recurrent ascites and h/o SBP:  He had unremarkable LFTs on arrival to the ED including normal ALT and AST.  There was low suspicion for SBP given that he was afebrile with normal WBC. Mentation was slightly altered on initial exam but this was likely due to hypoglycemia and uremia, as his  mentation improved following treatment of these issues.  He was continued on home SBP prophylaxis with TMP-SMX.  He was also started on ceftriaxone for additional SBP prophylaxis but this was discontinued on his second day given low likelihood of SBP.  He was discharged on home TMP-SMX 800-160mg  BID (had been prescribed QD but patient was taking BID) and lactulose 10mg  BID.  3. ESRD on hemodialysis MWF: On arrival had Cr of 10, which is not unusual for him (Cr fluctuating from ~5-12 over the past several months).  BUN was also elevated at 58 and he had an anion gap of 17-19.  His renal function improved following hemodialysis.  By day of discharge he had Cr 6.05, BUN 28, normal anion gap of 15.  On his second day he received his normal Monday hemodialysis session as an inpatient as he had already missed his regularly scheduled outpatient session. However, he terminated dialysis early and requested discharge papers on arrival back to unit. Given he had received a complete dialysis session the day prior we believed he was stable for discharge.  4. Hypertension: Initially hypertensive to the [id number] systolic in the ED but BP improved after hemodialysis.  He was continued on his home metoprolol 25mg  BID while in the hospital.  Home hydralazine was initially held.  He was discharged home on the same metoprolol dose plus home hydralazine 100mg  TID.  Discharge Vitals:   BP (!) 148/94 (BP Location: Left Leg)   Pulse 97   Temp 98.9 F (37.2 C) (Oral)   Resp 18   Wt 64.5 kg   SpO2 98%   BMI 20.40 kg/m   Pertinent Labs, Studies, and Procedures:  BMP Latest Ref Rng & Units 05/29/2019 05/28/2019 05/27/2019  Glucose 70 - 99 mg/dL 108(H) 77 94  BUN 6 - 20 mg/dL 23(H) 28(H) 64(H)  Creatinine 0.61 - 1.24 mg/dL 5.73(H) 6.05(H) 9.95(H)  Sodium 135 - 145 mmol/L 135 135 137  Potassium 3.5 - 5.1 mmol/L 4.7 5.4(H) 6.2(H)  Chloride 98 - 111 mmol/L 100 94(L) 96(L)  CO2 22 - 32 mmol/L 25 26 22   Calcium 8.9 - 10.3  mg/dL 8.9 9.1 10.2     Discharge Instructions: Discharge Instructions    Activity as tolerated - No restrictions   Complete by: As directed    Diet - low sodium heart healthy   Complete by: As directed       Signed: Tonia Ghent, Medical Student 05/29/2019, 10:25 AM   Pager: [phone]   Al Decant, MD 05/29/2019, 10:54 AM Pager: [phone]

## 2019-05-28 NOTE — Progress Notes (Signed)
Pt unwilling to wait for wheelchair, Pt walks towards elevators and not listening to staff.

## 2019-05-28 NOTE — Progress Notes (Signed)
Pt terminated dialysis early and requested discharge papers on arrival back to unit. Dr. Madilyn Fireman made aware. Pt eating lunch tray and refusing to wear tele at this time.

## 2019-05-29 ENCOUNTER — Encounter (HOSPITAL_COMMUNITY): Payer: Self-pay | Admitting: Emergency Medicine

## 2019-05-29 ENCOUNTER — Ambulatory Visit: Payer: Medicare Other | Admitting: Gastroenterology

## 2019-05-29 ENCOUNTER — Emergency Department (HOSPITAL_COMMUNITY)
Admission: EM | Admit: 2019-05-29 | Discharge: 2019-05-29 | Disposition: A | Discharge status: Home / Self Care | Payer: Medicare Other | Source: Home / Self Care | Attending: Emergency Medicine | Admitting: Emergency Medicine

## 2019-05-29 ENCOUNTER — Encounter (HOSPITAL_COMMUNITY): Payer: Self-pay | Admitting: *Deleted

## 2019-05-29 ENCOUNTER — Inpatient Hospital Stay (HOSPITAL_COMMUNITY)
Admission: EM | Admit: 2019-05-29 | Discharge: 2019-05-31 | DRG: 308 | Disposition: A | Discharge status: Home / Self Care | Payer: Medicare Other | Attending: Internal Medicine | Admitting: Internal Medicine

## 2019-05-29 ENCOUNTER — Other Ambulatory Visit: Payer: Self-pay

## 2019-05-29 ENCOUNTER — Emergency Department (HOSPITAL_COMMUNITY): Payer: Medicare Other

## 2019-05-29 DIAGNOSIS — Z9119 Patient's noncompliance with other medical treatment and regimen: Secondary | ICD-10-CM

## 2019-05-29 DIAGNOSIS — Z7951 Long term (current) use of inhaled steroids: Secondary | ICD-10-CM

## 2019-05-29 DIAGNOSIS — J811 Chronic pulmonary edema: Secondary | ICD-10-CM | POA: Diagnosis not present

## 2019-05-29 DIAGNOSIS — I4891 Unspecified atrial fibrillation: Secondary | ICD-10-CM

## 2019-05-29 DIAGNOSIS — W230XXA Caught, crushed, jammed, or pinched between moving objects, initial encounter: Secondary | ICD-10-CM | POA: Diagnosis not present

## 2019-05-29 DIAGNOSIS — I700 Atherosclerosis of aorta: Secondary | ICD-10-CM | POA: Diagnosis present

## 2019-05-29 DIAGNOSIS — I120 Hypertensive chronic kidney disease with stage 5 chronic kidney disease or end stage renal disease: Secondary | ICD-10-CM | POA: Insufficient documentation

## 2019-05-29 DIAGNOSIS — B181 Chronic viral hepatitis B without delta-agent: Secondary | ICD-10-CM | POA: Diagnosis present

## 2019-05-29 DIAGNOSIS — K7290 Hepatic failure, unspecified without coma: Secondary | ICD-10-CM | POA: Diagnosis present

## 2019-05-29 DIAGNOSIS — F17210 Nicotine dependence, cigarettes, uncomplicated: Secondary | ICD-10-CM | POA: Insufficient documentation

## 2019-05-29 DIAGNOSIS — Z800 Family history of malignant neoplasm of digestive organs: Secondary | ICD-10-CM

## 2019-05-29 DIAGNOSIS — J9811 Atelectasis: Secondary | ICD-10-CM | POA: Diagnosis not present

## 2019-05-29 DIAGNOSIS — I447 Left bundle-branch block, unspecified: Secondary | ICD-10-CM | POA: Diagnosis present

## 2019-05-29 DIAGNOSIS — S60416A Abrasion of right little finger, initial encounter: Secondary | ICD-10-CM | POA: Diagnosis not present

## 2019-05-29 DIAGNOSIS — N186 End stage renal disease: Secondary | ICD-10-CM | POA: Diagnosis not present

## 2019-05-29 DIAGNOSIS — Z20828 Contact with and (suspected) exposure to other viral communicable diseases: Secondary | ICD-10-CM | POA: Diagnosis present

## 2019-05-29 DIAGNOSIS — I050 Rheumatic mitral stenosis: Secondary | ICD-10-CM | POA: Diagnosis present

## 2019-05-29 DIAGNOSIS — I480 Paroxysmal atrial fibrillation: Principal | ICD-10-CM | POA: Diagnosis present

## 2019-05-29 DIAGNOSIS — Z79899 Other long term (current) drug therapy: Secondary | ICD-10-CM | POA: Insufficient documentation

## 2019-05-29 DIAGNOSIS — Y92231 Patient bathroom in hospital as the place of occurrence of the external cause: Secondary | ICD-10-CM | POA: Diagnosis not present

## 2019-05-29 DIAGNOSIS — I2510 Atherosclerotic heart disease of native coronary artery without angina pectoris: Secondary | ICD-10-CM | POA: Diagnosis present

## 2019-05-29 DIAGNOSIS — Z9114 Patient's other noncompliance with medication regimen: Secondary | ICD-10-CM

## 2019-05-29 DIAGNOSIS — I4892 Unspecified atrial flutter: Secondary | ICD-10-CM | POA: Diagnosis present

## 2019-05-29 DIAGNOSIS — S60414A Abrasion of right ring finger, initial encounter: Secondary | ICD-10-CM | POA: Diagnosis not present

## 2019-05-29 DIAGNOSIS — Z955 Presence of coronary angioplasty implant and graft: Secondary | ICD-10-CM

## 2019-05-29 DIAGNOSIS — R0602 Shortness of breath: Secondary | ICD-10-CM

## 2019-05-29 DIAGNOSIS — Z801 Family history of malignant neoplasm of trachea, bronchus and lung: Secondary | ICD-10-CM

## 2019-05-29 DIAGNOSIS — Z8619 Personal history of other infectious and parasitic diseases: Secondary | ICD-10-CM

## 2019-05-29 DIAGNOSIS — I252 Old myocardial infarction: Secondary | ICD-10-CM

## 2019-05-29 DIAGNOSIS — I1311 Hypertensive heart and chronic kidney disease without heart failure, with stage 5 chronic kidney disease, or end stage renal disease: Secondary | ICD-10-CM | POA: Diagnosis present

## 2019-05-29 DIAGNOSIS — Z8719 Personal history of other diseases of the digestive system: Secondary | ICD-10-CM

## 2019-05-29 DIAGNOSIS — Z808 Family history of malignant neoplasm of other organs or systems: Secondary | ICD-10-CM

## 2019-05-29 DIAGNOSIS — Z8249 Family history of ischemic heart disease and other diseases of the circulatory system: Secondary | ICD-10-CM

## 2019-05-29 DIAGNOSIS — Z886 Allergy status to analgesic agent status: Secondary | ICD-10-CM

## 2019-05-29 DIAGNOSIS — Z888 Allergy status to other drugs, medicaments and biological substances status: Secondary | ICD-10-CM

## 2019-05-29 DIAGNOSIS — Z03818 Encounter for observation for suspected exposure to other biological agents ruled out: Secondary | ICD-10-CM | POA: Diagnosis not present

## 2019-05-29 DIAGNOSIS — Z885 Allergy status to narcotic agent status: Secondary | ICD-10-CM

## 2019-05-29 DIAGNOSIS — Z992 Dependence on renal dialysis: Secondary | ICD-10-CM | POA: Diagnosis not present

## 2019-05-29 DIAGNOSIS — J449 Chronic obstructive pulmonary disease, unspecified: Secondary | ICD-10-CM | POA: Diagnosis present

## 2019-05-29 DIAGNOSIS — I482 Chronic atrial fibrillation, unspecified: Secondary | ICD-10-CM | POA: Diagnosis present

## 2019-05-29 DIAGNOSIS — B182 Chronic viral hepatitis C: Secondary | ICD-10-CM | POA: Diagnosis present

## 2019-05-29 DIAGNOSIS — K219 Gastro-esophageal reflux disease without esophagitis: Secondary | ICD-10-CM | POA: Diagnosis present

## 2019-05-29 DIAGNOSIS — I10 Essential (primary) hypertension: Secondary | ICD-10-CM | POA: Diagnosis present

## 2019-05-29 DIAGNOSIS — Z792 Long term (current) use of antibiotics: Secondary | ICD-10-CM

## 2019-05-29 DIAGNOSIS — Z5329 Procedure and treatment not carried out because of patient's decision for other reasons: Secondary | ICD-10-CM | POA: Diagnosis not present

## 2019-05-29 DIAGNOSIS — I959 Hypotension, unspecified: Secondary | ICD-10-CM | POA: Diagnosis present

## 2019-05-29 DIAGNOSIS — K7460 Unspecified cirrhosis of liver: Secondary | ICD-10-CM | POA: Diagnosis present

## 2019-05-29 DIAGNOSIS — Z825 Family history of asthma and other chronic lower respiratory diseases: Secondary | ICD-10-CM

## 2019-05-29 DIAGNOSIS — I2781 Cor pulmonale (chronic): Secondary | ICD-10-CM | POA: Diagnosis present

## 2019-05-29 LAB — CBC WITH DIFFERENTIAL/PLATELET
Abs Immature Granulocytes: 0.02 10*3/uL (ref 0.00–0.07)
Basophils Absolute: 0.1 10*3/uL (ref 0.0–0.1)
Basophils Relative: 1 %
Eosinophils Absolute: 0.1 10*3/uL (ref 0.0–0.5)
Eosinophils Relative: 1 %
HCT: 27.2 % — ABNORMAL LOW (ref 39.0–52.0)
Hemoglobin: 9.6 g/dL — ABNORMAL LOW (ref 13.0–17.0)
Immature Granulocytes: 0 %
Lymphocytes Relative: 19 %
Lymphs Abs: 1.2 10*3/uL (ref 0.7–4.0)
MCH: 27.3 pg (ref 26.0–34.0)
MCHC: 35.3 g/dL (ref 30.0–36.0)
MCV: 77.3 fL — ABNORMAL LOW (ref 80.0–100.0)
Monocytes Absolute: 0.6 10*3/uL (ref 0.1–1.0)
Monocytes Relative: 10 %
Neutro Abs: 4.3 10*3/uL (ref 1.7–7.7)
Neutrophils Relative %: 69 %
Platelets: 131 10*3/uL — ABNORMAL LOW (ref 150–400)
RBC: 3.52 MIL/uL — ABNORMAL LOW (ref 4.22–5.81)
RDW: 14.9 % (ref 11.5–15.5)
WBC: 6.2 10*3/uL (ref 4.0–10.5)
nRBC: 0 % (ref 0.0–0.2)

## 2019-05-29 LAB — CBC
HCT: 30.5 % — ABNORMAL LOW (ref 39.0–52.0)
HCT: 28.5 % — ABNORMAL LOW (ref 39.0–52.0)
Hemoglobin: 10.7 g/dL — ABNORMAL LOW (ref 13.0–17.0)
Hemoglobin: 10 g/dL — ABNORMAL LOW (ref 13.0–17.0)
MCH: 27.4 pg (ref 26.0–34.0)
MCH: 27.3 pg (ref 26.0–34.0)
MCHC: 35.1 g/dL (ref 30.0–36.0)
MCHC: 35.1 g/dL (ref 30.0–36.0)
MCV: 78.2 fL — ABNORMAL LOW (ref 80.0–100.0)
MCV: 77.9 fL — ABNORMAL LOW (ref 80.0–100.0)
Platelets: 164 10*3/uL (ref 150–400)
Platelets: 142 10*3/uL — ABNORMAL LOW (ref 150–400)
RBC: 3.9 MIL/uL — ABNORMAL LOW (ref 4.22–5.81)
RBC: 3.66 MIL/uL — ABNORMAL LOW (ref 4.22–5.81)
RDW: 15.2 % (ref 11.5–15.5)
RDW: 14.9 % (ref 11.5–15.5)
WBC: 8.3 10*3/uL (ref 4.0–10.5)
WBC: 8.5 10*3/uL (ref 4.0–10.5)
nRBC: 0 % (ref 0.0–0.2)
nRBC: 0 % (ref 0.0–0.2)

## 2019-05-29 LAB — BASIC METABOLIC PANEL
Anion gap: 10 (ref 5–15)
Anion gap: 15 (ref 5–15)
BUN: 23 mg/dL — ABNORMAL HIGH (ref 6–20)
BUN: 28 mg/dL — ABNORMAL HIGH (ref 6–20)
CO2: 25 mmol/L (ref 22–32)
CO2: 23 mmol/L (ref 22–32)
Calcium: 8.9 mg/dL (ref 8.9–10.3)
Calcium: 9.5 mg/dL (ref 8.9–10.3)
Chloride: 100 mmol/L (ref 98–111)
Chloride: 99 mmol/L (ref 98–111)
Creatinine, Ser: 5.73 mg/dL — ABNORMAL HIGH (ref 0.61–1.24)
Creatinine, Ser: 6.27 mg/dL — ABNORMAL HIGH (ref 0.61–1.24)
GFR calc Af Amer: 12 mL/min — ABNORMAL LOW (ref >60)
GFR calc Af Amer: 11 mL/min — ABNORMAL LOW (ref >60)
GFR calc non Af Amer: 10 mL/min — ABNORMAL LOW (ref >60)
GFR calc non Af Amer: 9 mL/min — ABNORMAL LOW (ref >60)
Glucose, Bld: 108 mg/dL — ABNORMAL HIGH (ref 70–99)
Glucose, Bld: 152 mg/dL — ABNORMAL HIGH (ref 70–99)
Potassium: 4.7 mmol/L (ref 3.5–5.1)
Potassium: 4.9 mmol/L (ref 3.5–5.1)
Sodium: 135 mmol/L (ref 135–145)
Sodium: 137 mmol/L (ref 135–145)

## 2019-05-29 LAB — TROPONIN I (HIGH SENSITIVITY)
Troponin I (High Sensitivity): 118 ng/L (ref <18)
Troponin I (High Sensitivity): 111 ng/L (ref <18)
Troponin I (High Sensitivity): 108 ng/L (ref <18)
Troponin I (High Sensitivity): 92 ng/L — ABNORMAL HIGH (ref <18)

## 2019-05-29 LAB — CREATININE, SERUM
Creatinine, Ser: 6.53 mg/dL — ABNORMAL HIGH (ref 0.61–1.24)
GFR calc Af Amer: 10 mL/min — ABNORMAL LOW (ref >60)
GFR calc non Af Amer: 9 mL/min — ABNORMAL LOW (ref >60)

## 2019-05-29 LAB — BRAIN NATRIURETIC PEPTIDE: B Natriuretic Peptide: >4500 pg/mL — ABNORMAL HIGH (ref 0.0–100.0)

## 2019-05-29 LAB — MAGNESIUM: Magnesium: 1.8 mg/dL (ref 1.7–2.4)

## 2019-05-29 IMAGING — CT CT ABD-PELV W/ CM
2 of 5 series · 16 of 46 positions shown, 18 images · IV contrast (Omni 300)
Comparison: CT of the abdomen and pelvis performed 11/16/2017

CLINICAL DATA: Acute onset of generalized abdominal pain and
distention.

EXAM:
CT ABDOMEN AND PELVIS WITH CONTRAST
TECHNIQUE: Multidetector CT imaging of the abdomen and pelvis was performed
using the standard protocol following bolus administration of
intravenous contrast.
CONTRAST:  100mL OMNIPAQUE IOHEXOL 300 MG/ML  SOLN

[Series 3: a/p w/ 5mm · axial · 0.77mm/px · z∈[+809,+1234]mm · 13 of 95 slices shown, 15 images]
[im 5/95  soft-tissue]
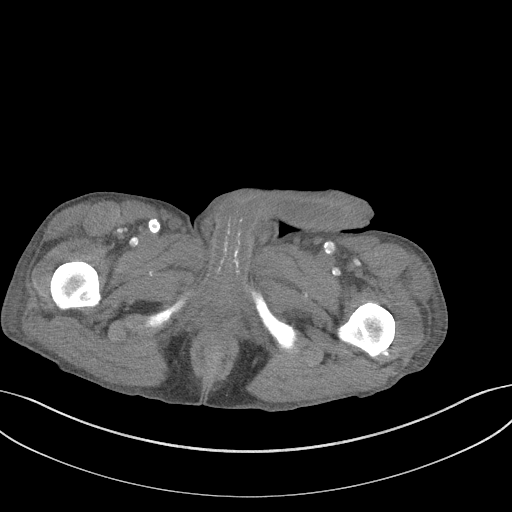
[im 5/95  bone]
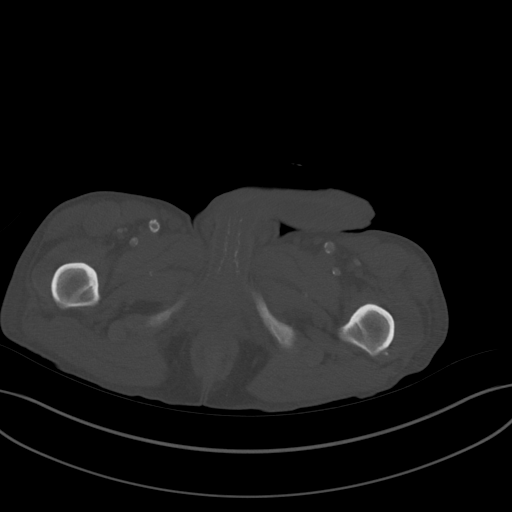
[im 14/95  soft-tissue]
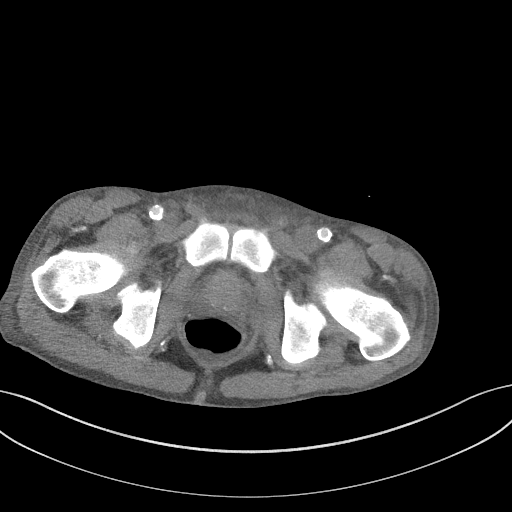
[im 18/95  soft-tissue]
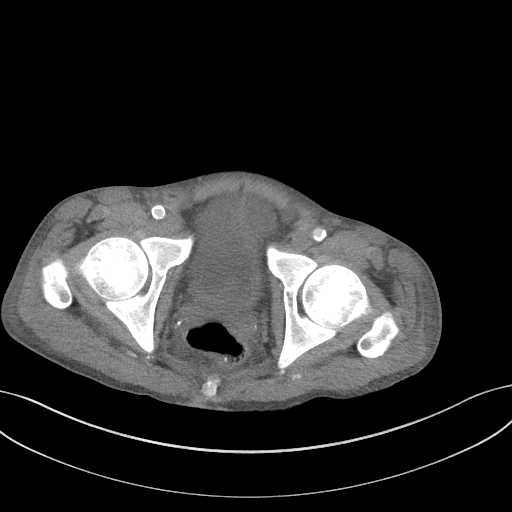
[im 27/95  soft-tissue]
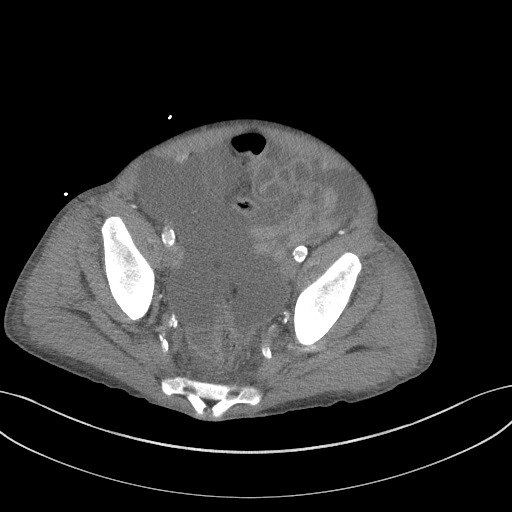
[im 32/95  soft-tissue]
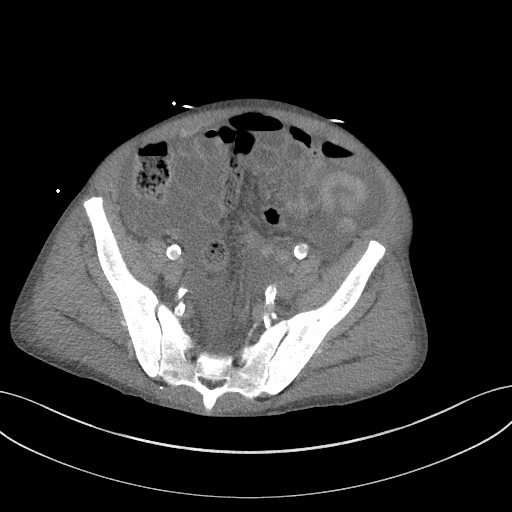
[im 41/95  soft-tissue]
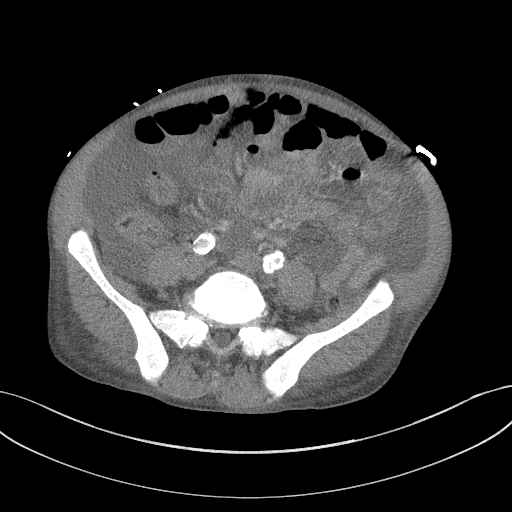
[im 50/95  soft-tissue]
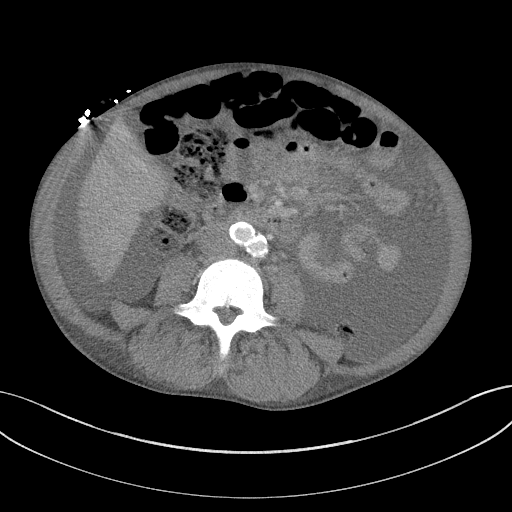
[im 54/95  soft-tissue]
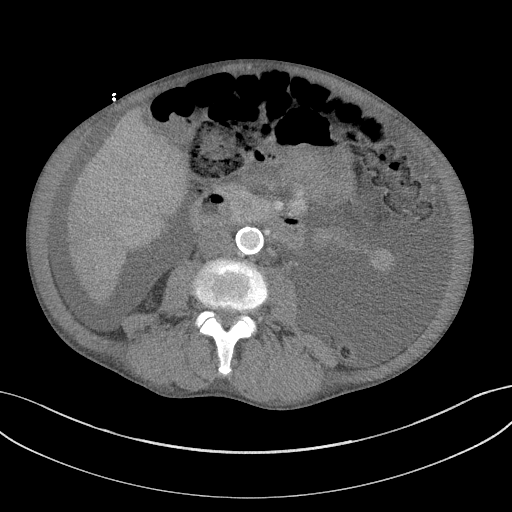
[im 63/95  soft-tissue]
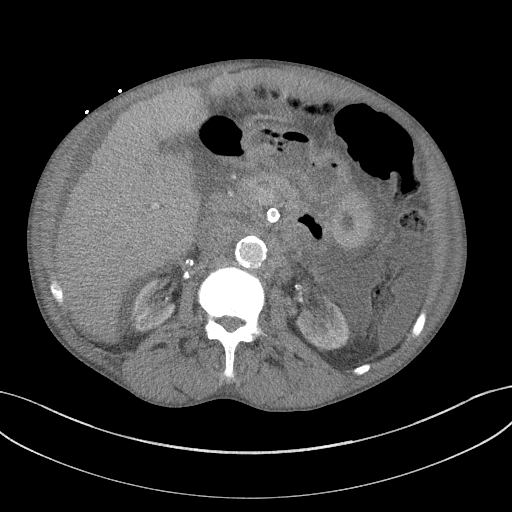
[im 63/95  bone]
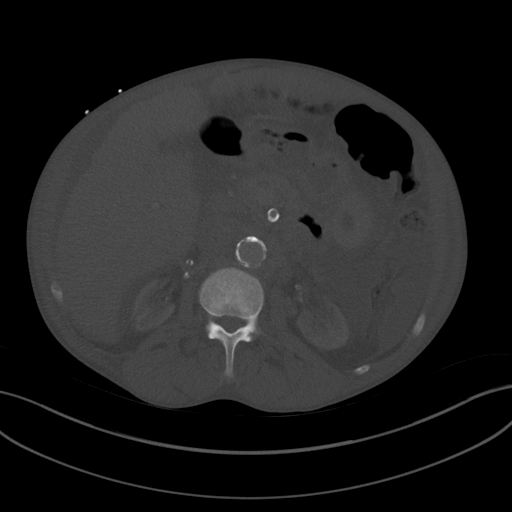
[im 68/95  soft-tissue]
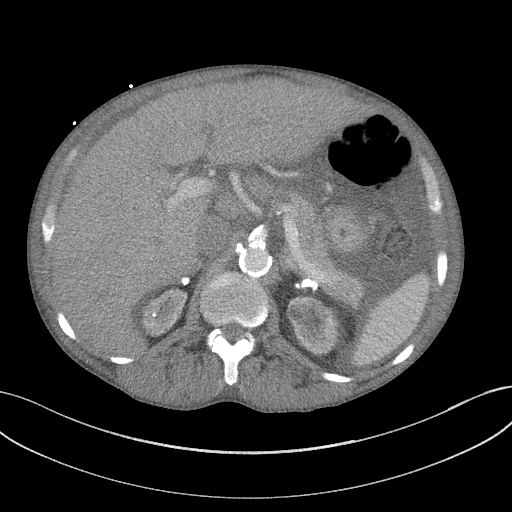
[im 77/95  soft-tissue]
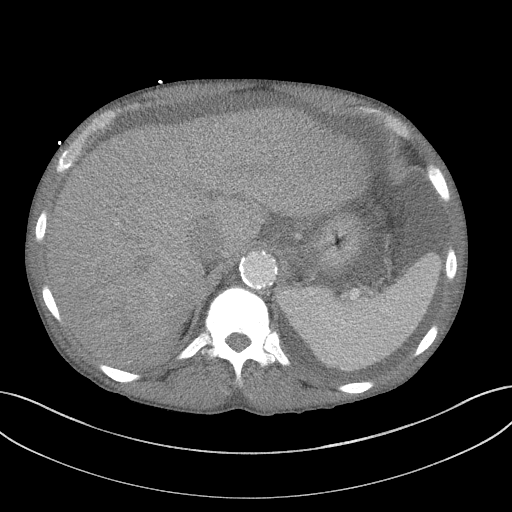
[im 81/95  soft-tissue]
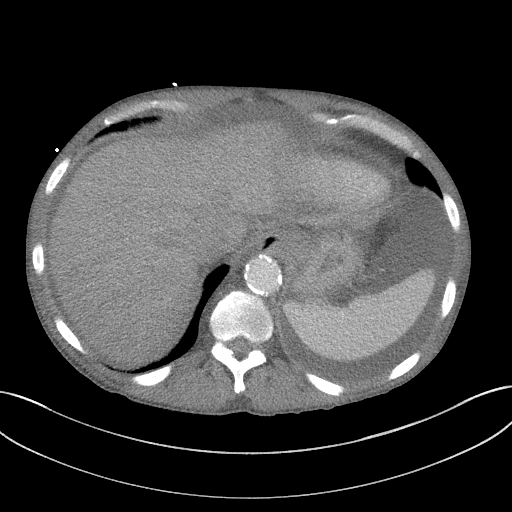
[im 90/95  soft-tissue]
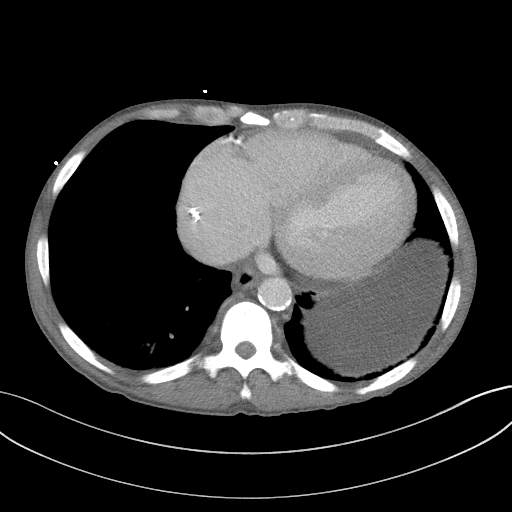

[Series 6: a/p w/ cor · coronal · 0.85mm/px · 3 of 132 slices shown]
[im 44/132  soft-tissue]
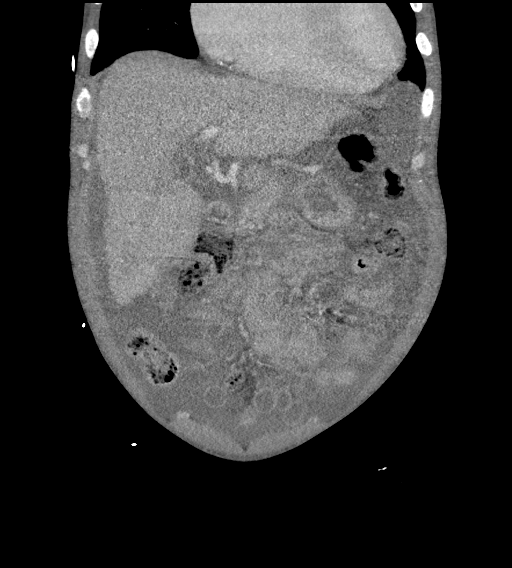
[im 59/132  soft-tissue]
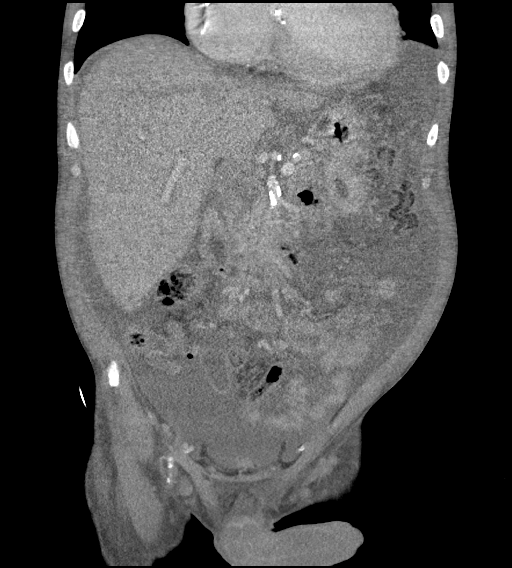
[im 73/132  soft-tissue]
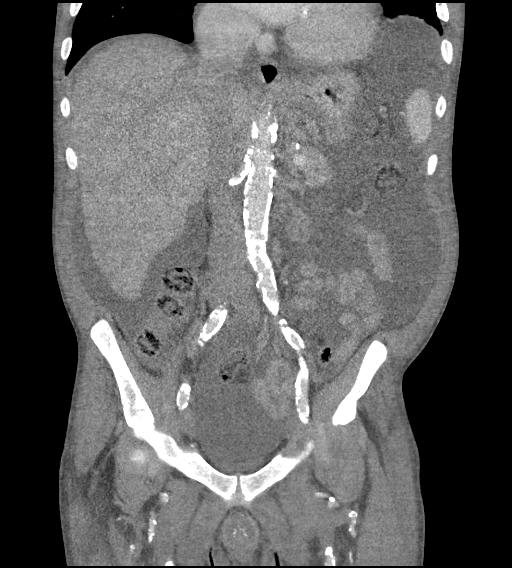

[16 of 46 positions shown; findings below may reference images not displayed]

FINDINGS: Lower chest: Mild bibasilar atelectasis or scarring is noted.
Scattered coronary artery calcifications are seen. Calcification is
noted at the mitral valve.

Hepatobiliary: The nodular contour of the liver is compatible with
hepatic cirrhosis. The gallbladder is difficult to fully assess
given surrounding ascites. The common bile duct remains normal in
caliber.

Pancreas: The pancreas is within normal limits.

Spleen: The spleen is unremarkable in appearance.

Adrenals/Urinary Tract: The adrenal glands are unremarkable in
appearance.

Severe chronic bilateral renal atrophy is noted, with scattered
bilateral renal cysts and diffuse bilateral vascular calcification.
There is no evidence of hydronephrosis. No obstructing ureteral
stones are seen.

Stomach/Bowel: The stomach is unremarkable in appearance. The small
bowel is within normal limits. The appendix is normal in caliber,
without evidence of appendicitis. The colon is unremarkable in
appearance.

Vascular/Lymphatic: Diffuse calcification is seen along the
abdominal aorta and its branches. The abdominal aorta is otherwise
grossly unremarkable. The inferior vena cava is grossly
unremarkable. No retroperitoneal lymphadenopathy is seen. No pelvic
sidewall lymphadenopathy is identified.

Reproductive: The bladder is decompressed and not well
characterized. The prostate is normal in size.

Other: Large volume ascites is noted within the abdomen and pelvis.
There is soft tissue density layering dependently within the pelvis.
This is of uncertain significance, given that fluid cultures and
pathology have been negative.

Musculoskeletal: No acute osseous abnormalities are identified. The
visualized musculature is unremarkable in appearance.
IMPRESSION: 1. Large volume ascites noted within the abdomen and pelvis, with
soft tissue density again noted layering dependently in the pelvis.
This is of uncertain significance, given that peritoneal fluid
cultures and pathology have been negative.
2. Findings of hepatic cirrhosis.
3. Severe chronic bilateral renal atrophy, with scattered bilateral
renal cysts.
4. Scattered coronary artery calcifications seen.
5. Mild bibasilar atelectasis or scarring noted.

Aortic Atherosclerosis ([id number].O).

## 2019-05-29 MED ORDER — SULFAMETHOXAZOLE-TRIMETHOPRIM 800-160 MG PO TABS
1.0000 | ORAL_TABLET | Freq: Every day | ORAL | Status: DC
  Administered 2019-05-29 – 2019-05-31 (×3): 1 via ORAL
  Filled 2019-05-29 (×3): qty 1

## 2019-05-29 MED ORDER — ZOLPIDEM TARTRATE 5 MG PO TABS
10.0000 mg | ORAL_TABLET | Freq: Every evening | ORAL | Status: DC | PRN
  Administered 2019-05-29: 10 mg via ORAL
  Filled 2019-05-29: qty 2

## 2019-05-29 MED ORDER — METOPROLOL TARTRATE 25 MG PO TABS
25.0000 mg | ORAL_TABLET | Freq: Two times a day (BID) | ORAL | Status: DC
  Administered 2019-05-29: 25 mg via ORAL
  Filled 2019-05-29: qty 1

## 2019-05-29 MED ORDER — DILTIAZEM HCL 25 MG/5ML IV SOLN
10.0000 mg | Freq: Once | INTRAVENOUS | Status: AC
  Administered 2019-05-29: 17:00:00 10 mg via INTRAVENOUS
  Filled 2019-05-29: qty 5

## 2019-05-29 MED ORDER — ONDANSETRON 4 MG PO TBDP
4.0000 mg | ORAL_TABLET | Freq: Three times a day (TID) | ORAL | Status: DC | PRN
  Filled 2019-05-29: qty 1

## 2019-05-29 MED ORDER — MOMETASONE FURO-FORMOTEROL FUM 200-5 MCG/ACT IN AERO
2.0000 | INHALATION_SPRAY | Freq: Two times a day (BID) | RESPIRATORY_TRACT | Status: DC
  Administered 2019-05-31: 2 via RESPIRATORY_TRACT
  Filled 2019-05-29 (×2): qty 8.8

## 2019-05-29 MED ORDER — ALBUTEROL SULFATE HFA 108 (90 BASE) MCG/ACT IN AERS
2.0000 | INHALATION_SPRAY | Freq: Four times a day (QID) | RESPIRATORY_TRACT | Status: DC | PRN
  Filled 2019-05-29: qty 6.7

## 2019-05-29 MED ORDER — HEPARIN SODIUM (PORCINE) 5000 UNIT/ML IJ SOLN
5000.0000 [IU] | Freq: Three times a day (TID) | INTRAMUSCULAR | Status: DC
  Administered 2019-05-30: 5000 [IU] via SUBCUTANEOUS
  Filled 2019-05-29 (×3): qty 1

## 2019-05-29 MED ORDER — LACTULOSE 10 GM/15ML PO SOLN
30.0000 g | Freq: Two times a day (BID) | ORAL | Status: DC
  Filled 2019-05-29 (×3): qty 45

## 2019-05-29 MED ORDER — DILTIAZEM HCL-DEXTROSE 125-5 MG/125ML-% IV SOLN (PREMIX)
5.0000 mg/h | INTRAVENOUS | Status: DC
  Administered 2019-05-29: 5 mg/h via INTRAVENOUS
  Filled 2019-05-29: qty 125

## 2019-05-29 MED ORDER — MONTELUKAST SODIUM 10 MG PO TABS
10.0000 mg | ORAL_TABLET | Freq: Every evening | ORAL | Status: DC
  Administered 2019-05-30: 17:00:00 10 mg via ORAL
  Filled 2019-05-29 (×2): qty 1

## 2019-05-29 MED ORDER — HYDROMORPHONE HCL 2 MG PO TABS
2.0000 mg | ORAL_TABLET | Freq: Once | ORAL | Status: AC
  Administered 2019-05-29: 2 mg via ORAL
  Filled 2019-05-29: qty 1

## 2019-05-29 MED ORDER — LORAZEPAM 2 MG/ML IJ SOLN
1.0000 mg | Freq: Once | INTRAMUSCULAR | Status: AC
  Administered 2019-05-29: 1 mg via INTRAVENOUS
  Filled 2019-05-29: qty 1

## 2019-05-29 MED ORDER — SODIUM CHLORIDE 0.9% FLUSH
3.0000 mL | Freq: Once | INTRAVENOUS | Status: AC
  Administered 2019-05-29: 3 mL via INTRAVENOUS

## 2019-05-29 MED ORDER — PANTOPRAZOLE SODIUM 40 MG PO TBEC
40.0000 mg | DELAYED_RELEASE_TABLET | Freq: Every day | ORAL | Status: DC
  Administered 2019-05-29 – 2019-05-31 (×3): 40 mg via ORAL
  Filled 2019-05-29 (×3): qty 1

## 2019-05-29 NOTE — ED Notes (Signed)
ED TO INPATIENT HANDOFF REPORT  ED Nurse Name and Phone #: Shirell Struthers/Kim [phone]   S Name/Age/Gender [name] 58 y.o. male Room/Bed: 023C/023C  Code Status   Code Status: DNR  Home/SNF/Other Home Patient oriented to: self, place, time and situation Is this baseline? Yes   Triage Complete: Triage complete  Chief Complaint Tachy  Triage Note Pt here from home, just d/c from ED- pt went home and was getting out of bed when he felt his heart racing again. Arrives with HR in 140s, hypotensive.    Allergies Allergies  Allergen Reactions  . Morphine And Related Other (See Comments)    Stomach pain  . Aspirin Other (See Comments)    STOMACH PAIN  . Clonidine Derivatives Itching  . Tramadol Itching  . Tylenol [Acetaminophen] Nausea Only    Stomach ache    Level of Care/Admitting Diagnosis ED Disposition    ED Disposition Condition Comment   Admit  Hospital Area: [facility] [[id number]]  Level of Care: Telemetry Cardiac [[id number]]  Covid Evaluation: Asymptomatic Screening Protocol (No Symptoms)  Diagnosis: Atrial fibrillation with RVR ([place]) [706237]  Admitting Physician: Lucious Groves [[id number]]  Attending Physician: HOFFMAN, ERIK C [[id number]]  PT Class (Do Not Modify): Observation [104]  PT Acc Code (Do Not Modify): Observation [10022]       B Medical/Surgery History Past Medical History:  Diagnosis Date  . Anemia   . Anxiety   . Arthritis    left shoulder  . Atherosclerosis of aorta ([place])   . Cardiomegaly   . Chest pain    DATE UNKNOWN, C/O PERIODICALLY  . Cocaine abuse ([place])   . COPD exacerbation ([place]) 08/17/2016  . Coronary artery disease    stent 02/22/17  . ESRD (end stage renal disease) on dialysis ([employer])    "[name]; MWF" (07/04/2017)  . GERD (gastroesophageal reflux disease)    DATE UNKNOWN  . Hemorrhoids   . Hepatitis B, chronic ([place])   . Hepatitis C   . History of kidney stones   . Hyperkalemia   . Hypertension   . Kidney  failure   . Metabolic bone disease    Patient denies  . Mitral stenosis   . Myocardial infarction ([place])   . Pneumonia   . Pulmonary edema   . Solitary rectal ulcer syndrome 07/2017   at flex sig for rectal bleeding  . Tubular adenoma of colon    Past Surgical History:  Procedure Laterality Date  . A/V FISTULAGRAM Left 05/26/2017   Procedure: A/V FISTULAGRAM;  Surgeon: Conrad Wilmette, MD;  Location: [facility];  Service: Cardiovascular;  Laterality: Left;  . A/V FISTULAGRAM Right 11/18/2017   Procedure: A/V FISTULAGRAM - Right Arm;  Surgeon: Elam Dutch, MD;  Location: [facility];  Service: Cardiovascular;  Laterality: Right;  . APPLICATION OF WOUND VAC Left 06/14/2017   Procedure: APPLICATION OF WOUND VAC;  Surgeon: Katha Cabal, MD;  Location: [facility];  Service: Vascular;  Laterality: Left;  . AV FISTULA PLACEMENT  2012   BELIEVED WAS PLACED IN JUNE  . AV FISTULA PLACEMENT Right 08/09/2017   Procedure: Creation Right arm ARTERIOVENOUS BRACHIOCEPOHALIC FISTULA;  Surgeon: Elam Dutch, MD;  Location: [facility];  Service: Vascular;  Laterality: Right;  . AV FISTULA PLACEMENT Right 11/22/2017   Procedure: INSERTION OF ARTERIOVENOUS (AV) GORE-TEX GRAFT RIGHT UPPER ARM;  Surgeon: Elam Dutch, MD;  Location: [place];  Service: Vascular;  Laterality: Right;  .  BIOPSY  01/25/2018   Procedure: BIOPSY;  Surgeon: Jerene Bears, MD;  Location: [place];  Service: Gastroenterology;;  . BIOPSY  04/10/2019   Procedure: BIOPSY;  Surgeon: Jerene Bears, MD;  Location: [facility];  Service: Gastroenterology;;  . COLONOSCOPY    . COLONOSCOPY WITH PROPOFOL N/A 01/25/2018   Procedure: COLONOSCOPY WITH PROPOFOL;  Surgeon: Jerene Bears, MD;  Location: [place];  Service: Gastroenterology;  Laterality: N/A;  . CORONARY STENT INTERVENTION N/A 02/22/2017   Procedure: CORONARY STENT INTERVENTION;  Surgeon: Nigel Mormon, MD;  Location: [place] [facility];  Service:  Cardiovascular;  Laterality: N/A;  . ESOPHAGOGASTRODUODENOSCOPY (EGD) WITH PROPOFOL N/A 01/25/2018   Procedure: ESOPHAGOGASTRODUODENOSCOPY (EGD) WITH PROPOFOL;  Surgeon: Jerene Bears, MD;  Location: [facility] [facility];  Service: Gastroenterology;  Laterality: N/A;  . ESOPHAGOGASTRODUODENOSCOPY (EGD) WITH PROPOFOL N/A 04/10/2019   Procedure: ESOPHAGOGASTRODUODENOSCOPY (EGD) WITH PROPOFOL;  Surgeon: Jerene Bears, MD;  Location: [facility];  Service: Gastroenterology;  Laterality: N/A;  . FLEXIBLE SIGMOIDOSCOPY N/A 07/15/2017   Procedure: FLEXIBLE SIGMOIDOSCOPY;  Surgeon: Carol Ada, MD;  Location: [place];  Service: Endoscopy;  Laterality: N/A;  . HEMORRHOID BANDING    . I&D EXTREMITY Left 06/01/2017   Procedure: IRRIGATION AND DEBRIDEMENT LEFT ARM HEMATOMA WITH LIGATION OF LEFT ARM AV FISTULA;  Surgeon: Elam Dutch, MD;  Location: [place];  Service: Vascular;  Laterality: Left;  . I&D EXTREMITY Left 06/14/2017   Procedure: IRRIGATION AND DEBRIDEMENT EXTREMITY;  Surgeon: Katha Cabal, MD;  Location: [facility];  Service: Vascular;  Laterality: Left;  . INSERTION OF DIALYSIS CATHETER  05/30/2017  . INSERTION OF DIALYSIS CATHETER N/A 05/30/2017   Procedure: INSERTION OF DIALYSIS CATHETER;  Surgeon: Elam Dutch, MD;  Location: [place];  Service: Vascular;  Laterality: N/A;  . IR PARACENTESIS  08/30/2017  . IR PARACENTESIS  09/29/2017  . IR PARACENTESIS  10/28/2017  . IR PARACENTESIS  11/09/2017  . IR PARACENTESIS  11/16/2017  . IR PARACENTESIS  11/28/2017  . IR PARACENTESIS  12/01/2017  . IR PARACENTESIS  12/06/2017  . IR PARACENTESIS  01/03/2018  . IR PARACENTESIS  01/23/2018  . IR PARACENTESIS  02/07/2018  . IR PARACENTESIS  02/21/2018  . IR PARACENTESIS  03/06/2018  . IR PARACENTESIS  03/17/2018  . IR PARACENTESIS  04/04/2018  . IR PARACENTESIS  12/28/2018  . IR PARACENTESIS  01/08/2019  . IR PARACENTESIS  01/23/2019  . IR PARACENTESIS  02/01/2019  . IR PARACENTESIS  02/19/2019  . IR  PARACENTESIS  03/01/2019  . IR PARACENTESIS  03/15/2019  . IR PARACENTESIS  04/03/2019  . IR PARACENTESIS  04/12/2019  . IR PARACENTESIS  05/01/2019  . IR PARACENTESIS  05/08/2019  . IR PARACENTESIS  05/24/2019  . IR RADIOLOGIST EVAL & MGMT  02/14/2018  . IR RADIOLOGIST EVAL & MGMT  02/22/2019  . LEFT HEART CATH AND CORONARY ANGIOGRAPHY N/A 02/22/2017   Procedure: LEFT HEART CATH AND CORONARY ANGIOGRAPHY;  Surgeon: Nigel Mormon, MD;  Location: [place] [facility];  Service: Cardiovascular;  Laterality: N/A;  . LIGATION OF ARTERIOVENOUS  FISTULA Left 07/13/8784   Procedure: Plication of Left Arm Arteriovenous Fistula;  Surgeon: Elam Dutch, MD;  Location: [place];  Service: Vascular;  Laterality: Left;  . POLYPECTOMY    . POLYPECTOMY  01/25/2018   Procedure: POLYPECTOMY;  Surgeon: Jerene Bears, MD;  Location: [place];  Service: Gastroenterology;;  . REVISON OF ARTERIOVENOUS FISTULA Left 7/67/2094   Procedure: PLICATION OF  DISTAL ANEURYSMAL SEGEMENT OF LEFT UPPER ARM ARTERIOVENOUS FISTULA;  Surgeon: Elam Dutch, MD;  Location: [place];  Service: Vascular;  Laterality: Left;  . REVISON OF ARTERIOVENOUS FISTULA Left 5/70/1779   Procedure: Plication of Left Upper Arm Fistula ;  Surgeon: Waynetta Sandy, MD;  Location: [place];  Service: Vascular;  Laterality: Left;  . SKIN GRAFT SPLIT THICKNESS LEG / FOOT Left    SKIN GRAFT SPLIT THICKNESS LEFT ARM DONOR SITE: LEFT ANTERIOR THIGH  . SKIN SPLIT GRAFT Left 07/04/2017   Procedure: SKIN GRAFT SPLIT THICKNESS LEFT ARM DONOR SITE: LEFT ANTERIOR THIGH;  Surgeon: Elam Dutch, MD;  Location: [place];  Service: Vascular;  Laterality: Left;  . THROMBECTOMY W/ EMBOLECTOMY Left 06/05/2017   Procedure: EXPLORATION OF LEFT ARM FOR BLEEDING; OVERSEWED PROXIMAL FISTULA;  Surgeon: Angelia Mould, MD;  Location: [place];  Service: Vascular;  Laterality: Left;  . WOUND EXPLORATION Left 06/03/2017   Procedure: WOUND EXPLORATION WITH WOUND  VAC APPLICATION TO LEFT ARM;  Surgeon: Angelia Mould, MD;  Location: [place];  Service: Vascular;  Laterality: Left;     A IV Location/Drains/Wounds Patient Lines/Drains/Airways Status   Active Line/Drains/Airways    Name:   Placement date:   Placement time:   Site:   Days:   Peripheral IV 05/29/19 Left;Posterior Forearm   05/29/19    1659    Forearm   less than 1   Fistula / Graft Right Upper arm Arteriovenous vein graft   11/22/17    0836    Upper arm   553          Intake/Output Last 24 hours No intake or output data in the 24 hours ending 05/29/19 2005  Labs/Imaging Results for orders placed or performed during the hospital encounter of 05/29/19 (from the past 48 hour(s))  Basic metabolic panel     Status: Abnormal   Collection Time: 05/29/19  4:14 PM  Result Value Ref Range   Sodium 137 135 - 145 mmol/L   Potassium 4.9 3.5 - 5.1 mmol/L   Chloride 99 98 - 111 mmol/L   CO2 23 22 - 32 mmol/L   Glucose, Bld 152 (H) 70 - 99 mg/dL   BUN 28 (H) 6 - 20 mg/dL   Creatinine, Ser 6.27 (H) 0.61 - 1.24 mg/dL   Calcium 9.5 8.9 - 10.3 mg/dL   GFR calc non Af Amer 9 (L) >60 mL/min   GFR calc Af Amer 11 (L) >60 mL/min   Anion gap 15 5 - 15    Comment: Performed at [facility], [address], [place], [place] [address]  CBC     Status: Abnormal   Collection Time: 05/29/19  4:14 PM  Result Value Ref Range   WBC 8.3 4.0 - 10.5 K/uL   RBC 3.90 (L) 4.22 - 5.81 MIL/uL   Hemoglobin 10.7 (L) 13.0 - 17.0 g/dL   HCT 30.5 (L) 39.0 - 52.0 %   MCV 78.2 (L) 80.0 - 100.0 fL   MCH 27.4 26.0 - 34.0 pg   MCHC 35.1 30.0 - 36.0 g/dL   RDW 15.2 11.5 - 15.5 %   Platelets 164 150 - 400 K/uL   nRBC 0.0 0.0 - 0.2 %    Comment: Performed at [facility], [place] [address], [place], [place] [address]  Troponin I (High Sensitivity)     Status: Abnormal   Collection Time: 05/29/19  4:14 PM  Result Value Ref Range  Troponin I (High Sensitivity) 108 (HH) <18 ng/L    Comment: CRITICAL  VALUE NOTED.  VALUE IS CONSISTENT WITH PREVIOUSLY REPORTED AND CALLED VALUE. (NOTE) Elevated high sensitivity troponin I (hsTnI) values and significant  changes across serial measurements may suggest ACS but many other  chronic and acute conditions are known to elevate hsTnI results.  Refer to the Links section for chest pain algorithms and additional  guidance. Performed at [facility], [place] [address], [place], [place] [address]   CBC     Status: Abnormal   Collection Time: 05/29/19  7:30 PM  Result Value Ref Range   WBC 8.5 4.0 - 10.5 K/uL   RBC 3.66 (L) 4.22 - 5.81 MIL/uL   Hemoglobin 10.0 (L) 13.0 - 17.0 g/dL   HCT 28.5 (L) 39.0 - 52.0 %   MCV 77.9 (L) 80.0 - 100.0 fL   MCH 27.3 26.0 - 34.0 pg   MCHC 35.1 30.0 - 36.0 g/dL   RDW 14.9 11.5 - 15.5 %   Platelets 142 (L) 150 - 400 K/uL   nRBC 0.0 0.0 - 0.2 %    Comment: Performed at [facility], [place] [address] [place], [place] [address]   Dg Chest Portable 1 View  Result Date: 05/29/2019 CLINICAL DATA:  Dyspnea EXAM: PORTABLE CHEST 1 VIEW COMPARISON:  05/27/2019 chest radiograph. FINDINGS: Stable cardiomediastinal silhouette with moderate cardiomegaly. No pneumothorax. No pleural effusion. Mild pulmonary edema is slightly improved. Mild left lung base atelectasis. IMPRESSION: 1. Moderate cardiomegaly.  Mild pulmonary edema, slightly improved. 2. Stable mild left lung base atelectasis. Electronically Signed   By: Ilona Sorrel M.D.   On: 05/29/2019 09:48    Pending Labs Unresulted Labs (From admission, onward)    Start     Ordered   05/30/19 0500  Renal function panel  Tomorrow morning,   R     05/29/19 1844   05/30/19 0500  CBC  Tomorrow morning,   R     05/29/19 1844   05/29/19 1853  SARS CORONAVIRUS 2 (TAT 6-24 HRS) Nasopharyngeal Nasopharyngeal Swab  (Asymptomatic/Tier 2)  Once,   STAT    Question Answer Comment  Is this test for diagnosis or screening Screening   Symptomatic for COVID-19 as defined by CDC No    Hospitalized for COVID-19 No   Admitted to ICU for COVID-19 No   Previously tested for COVID-19 Yes   Resident in a congregate (group) care setting No   Employed in healthcare setting No      05/29/19 1853   05/29/19 1824  Creatinine, serum  (heparin)  Once,   STAT    Comments: Baseline for heparin therapy IF NOT ALREADY DRAWN.    05/29/19 1827          Vitals/Pain Today's Vitals   05/29/19 1845 05/29/19 1900 05/29/19 1915 05/29/19 1930  BP: 103/60 115/67 140/73 125/75  Pulse: 74     Resp: 13 14 12 17   Temp:      TempSrc:      SpO2: 96%     Weight:      Height:      PainSc:        Isolation Precautions No active isolations  Medications Medications  diltiazem (CARDIZEM) 125 mg in dextrose 5% 125 mL (1 mg/mL) infusion (5 mg/hr Intravenous New Bag/Given 05/29/19 1712)  sulfamethoxazole-trimethoprim (BACTRIM DS) 800-160 MG per tablet 1 tablet (1 tablet Oral Given 05/29/19 1941)  zolpidem (AMBIEN) tablet 10 mg (has no  administration in time range)  lactulose (CHRONULAC) 10 GM/15ML solution 30 g (has no administration in time range)  ondansetron (ZOFRAN-ODT) disintegrating tablet 4 mg (has no administration in time range)  pantoprazole (PROTONIX) EC tablet 40 mg (40 mg Oral Given 05/29/19 1941)  albuterol (VENTOLIN HFA) 108 (90 Base) MCG/ACT inhaler 2 puff (has no administration in time range)  montelukast (SINGULAIR) tablet 10 mg (has no administration in time range)  mometasone-formoterol (DULERA) 200-5 MCG/ACT inhaler 2 puff (has no administration in time range)  heparin injection 5,000 Units (has no administration in time range)  sodium chloride flush (NS) 0.9 % injection 3 mL (3 mLs Intravenous Given 05/29/19 1712)  LORazepam (ATIVAN) injection 1 mg (1 mg Intravenous Given 05/29/19 1659)  diltiazem (CARDIZEM) injection 10 mg (10 mg Intravenous Given 05/29/19 1701)    Mobility walks Low fall risk   Focused Assessments Cardiac Assessment Handoff:  Cardiac  Rhythm: Normal sinus rhythm, Bundle branch block Lab Results  Component Value Date   CKTOTAL 274 (H) 12/23/2010   CKMB 4.6 (H) 12/23/2010   TROPONINI 0.19 (HH) 01/02/2019   Lab Results  Component Value Date   DDIMER >20.00 (H) 07/08/2017   Does the Patient currently have chest pain? No     R Recommendations: See Admitting Provider Note  Report given to:   Additional Notes:

## 2019-05-29 NOTE — ED Provider Notes (Signed)
[facility] EMERGENCY DEPARTMENT Provider Note   CSN: [id number] Arrival date & time: 05/29/19  0831     History   Chief Complaint Chief Complaint  Patient presents with  . Shortness of Breath    HPI [name] is a 58 y.o. male with a history of end-stage renal disease on dialysis (MWF), hepatitis, cirrhosis, hypertension, COPD, coronary artery disease status post stents in August 2018, MI, anemia, presented to emergency department shortness of breath.  Patient called EMS this morning after became acutely short of breath after waking up.  He was having palpitations and felt short of breath and lightheaded.  EMS reports when they initially arrived the patient was tachycardic with a heart rate in the 419F, systolic blood pressure of 80.  Her ECG showed at the patient was in rapid A. fib.  They gave the patient 20 mg of IV Cardizem, which improved his heart rate down below 100, as well as improved his blood pressure.  Here in the ER, the patient has no acute complaints.  He states that he feels well and is no longer short of breath or having palpitations.  He states he is prescribed Toprol at home for rate control and has been taking it and compliant.  He also reports that he has been compliant with his dialysis.  He most recently was dialyzed 2 days ago on an emergent basis, at which time the patient was seen in the emergency department for having missed recent dialysis. He was found to have marked hyperkalemia at the time.     HPI   Past Medical History:  Diagnosis Date  . Anemia   . Anxiety   . Arthritis    left shoulder  . Atherosclerosis of aorta ([place])   . Cardiomegaly   . Chest pain    DATE UNKNOWN, C/O PERIODICALLY  . Cocaine abuse ([place])   . COPD exacerbation ([place]) 08/17/2016  . Coronary artery disease    stent 02/22/17  . ESRD (end stage renal disease) on dialysis ([place])    "[name]; MWF" (07/04/2017)  . GERD (gastroesophageal reflux  disease)    DATE UNKNOWN  . Hemorrhoids   . Hepatitis B, chronic ([place])   . Hepatitis C   . History of kidney stones   . Hyperkalemia   . Hypertension   . Kidney failure   . Metabolic bone disease    Patient denies  . Mitral stenosis   . Myocardial infarction ([place])   . Pneumonia   . Pulmonary edema   . Solitary rectal ulcer syndrome 07/2017   at flex sig for rectal bleeding  . Tubular adenoma of colon     Patient Active Problem List   Diagnosis Date Noted  . Melena   . Pressure injury of skin 03/09/2019  . Abdominal distention   . Volume overload 12/28/2018  . Sepsis ([place]) 09/12/2018  . Atherosclerosis of native coronary artery of native heart without angina pectoris 03/11/2018  . Benign neoplasm of cecum   . Benign neoplasm of ascending colon   . Benign neoplasm of descending colon   . Benign neoplasm of rectum   . Paroxysmal atrial fibrillation ([place]) 01/23/2018  . Hx of colonic polyps 01/20/2018  . ESRD (end stage renal disease) ([place]) 11/21/2017  . GERD (gastroesophageal reflux disease) 11/16/2017  . Liver cirrhosis ([place]) 11/15/2017  . DNR (do not resuscitate)   . Palliative care by specialist   . Hyponatremia 11/04/2017  . SBP (spontaneous bacterial peritonitis) (  [place]) 10/30/2017  . Liver disease, chronic 10/30/2017  . SOB (shortness of breath)   . Abdominal pain 10/28/2017  . Upper airway cough syndrome with flattening on f/v loop 10/13/17 c/w vcd 10/17/2017  . Elevated diaphragm 10/13/2017  . Ileus ([place]) 09/29/2017  . Malnutrition of moderate degree 09/29/2017  . Sinus congestion 09/03/2017  . Symptomatic anemia 09/02/2017  . Other cirrhosis of liver ([place]) 09/02/2017  . Left bundle branch block 09/02/2017  . Mitral stenosis 09/02/2017  . Hematochezia 07/15/2017  . Wide-complex tachycardia ([place])   . Endotracheally intubated   . ESRD on dialysis ([place]) 07/04/2017  . Acute respiratory failure with hypoxia ([place]) 06/18/2017  . CKD (chronic kidney disease) stage V  requiring chronic dialysis ([place]) 06/18/2017  . History of Cocaine abuse ([place]) 06/18/2017  . Hypertension 06/18/2017  . Infection of AV graft for dialysis ([place]) 06/18/2017  . Anxiety 06/18/2017  . Anemia due to chronic kidney disease 06/18/2017  . Atrial flutter with rapid ventricular response ([place]) 06/18/2017  . Personality disorder ([place]) 06/13/2017  . Cellulitis 06/12/2017  . Adjustment disorder with mixed anxiety and depressed mood 06/10/2017  . Suicidal ideation 06/10/2017  . Arm wound, left, sequela 06/10/2017  . Dyspnea on exertion 05/29/2017  . Tachycardia 05/29/2017  . Hyperkalemia 05/22/2017  . Acute metabolic encephalopathy   . Anemia 04/23/2017  . Ascites 04/23/2017  . COPD (chronic obstructive pulmonary disease) ([place]) 04/23/2017  . Acute on chronic respiratory failure with hypoxia ([place]) 03/25/2017  . Arrhythmia 03/25/2017  . COPD GOLD 0 with flattening on inps f/v  09/27/2016  . Essential hypertension 09/27/2016  . Fluid overload 08/30/2016  . COPD exacerbation ([place]) 08/17/2016  . Hypertensive urgency 08/17/2016  . Respiratory failure ([place]) 08/17/2016  . Problem with dialysis access ([place]) 07/23/2016  . Chronic hepatitis B ([place]) 03/05/2014  . Chronic hepatitis C without hepatic coma ([place]) 03/05/2014  . Internal hemorrhoids with bleeding, swelling and itching 03/05/2014  . Thrombocytopenia ([place]) 03/05/2014  . Chest pain 02/27/2014  . Alcohol abuse 04/14/2009  . Cigarette smoker 04/14/2009  . GANGLION CYST 04/14/2009    Past Surgical History:  Procedure Laterality Date  . A/V FISTULAGRAM Left 05/26/2017   Procedure: A/V FISTULAGRAM;  Surgeon: Conrad Manteno, MD;  Location: [facility];  Service: Cardiovascular;  Laterality: Left;  . A/V FISTULAGRAM Right 11/18/2017   Procedure: A/V FISTULAGRAM - Right Arm;  Surgeon: Elam Dutch, MD;  Location: [facility];  Service: Cardiovascular;  Laterality: Right;  . APPLICATION OF WOUND VAC Left 06/14/2017    Procedure: APPLICATION OF WOUND VAC;  Surgeon: Katha Cabal, MD;  Location: [facility];  Service: Vascular;  Laterality: Left;  . AV FISTULA PLACEMENT  2012   BELIEVED WAS PLACED IN JUNE  . AV FISTULA PLACEMENT Right 08/09/2017   Procedure: Creation Right arm ARTERIOVENOUS BRACHIOCEPOHALIC FISTULA;  Surgeon: Elam Dutch, MD;  Location: [facility] OR;  Service: Vascular;  Laterality: Right;  . AV FISTULA PLACEMENT Right 11/22/2017   Procedure: INSERTION OF ARTERIOVENOUS (AV) GORE-TEX GRAFT RIGHT UPPER ARM;  Surgeon: Elam Dutch, MD;  Location: [facility];  Service: Vascular;  Laterality: Right;  . BIOPSY  01/25/2018   Procedure: BIOPSY;  Surgeon: Jerene Bears, MD;  Location: [facility];  Service: Gastroenterology;;  . BIOPSY  04/10/2019   Procedure: BIOPSY;  Surgeon: Jerene Bears, MD;  Location: [facility];  Service: Gastroenterology;;  . COLONOSCOPY    . COLONOSCOPY WITH PROPOFOL N/A 01/25/2018   Procedure: COLONOSCOPY  WITH PROPOFOL;  Surgeon: Jerene Bears, MD;  Location: [facility] ENDOSCOPY;  Service: Gastroenterology;  Laterality: N/A;  . CORONARY STENT INTERVENTION N/A 02/22/2017   Procedure: CORONARY STENT INTERVENTION;  Surgeon: Nigel Mormon, MD;  Location: [facility];  Service: Cardiovascular;  Laterality: N/A;  . ESOPHAGOGASTRODUODENOSCOPY (EGD) WITH PROPOFOL N/A 01/25/2018   Procedure: ESOPHAGOGASTRODUODENOSCOPY (EGD) WITH PROPOFOL;  Surgeon: Jerene Bears, MD;  Location: [facility];  Service: Gastroenterology;  Laterality: N/A;  . ESOPHAGOGASTRODUODENOSCOPY (EGD) WITH PROPOFOL N/A 04/10/2019   Procedure: ESOPHAGOGASTRODUODENOSCOPY (EGD) WITH PROPOFOL;  Surgeon: Jerene Bears, MD;  Location: [facility];  Service: Gastroenterology;  Laterality: N/A;  . FLEXIBLE SIGMOIDOSCOPY N/A 07/15/2017   Procedure: FLEXIBLE SIGMOIDOSCOPY;  Surgeon: Carol Ada, MD;  Location: [facility];  Service: Endoscopy;  Laterality: N/A;  . HEMORRHOID BANDING    . I&D EXTREMITY Left 06/01/2017    Procedure: IRRIGATION AND DEBRIDEMENT LEFT ARM HEMATOMA WITH LIGATION OF LEFT ARM AV FISTULA;  Surgeon: Elam Dutch, MD;  Location: [facility];  Service: Vascular;  Laterality: Left;  . I&D EXTREMITY Left 06/14/2017   Procedure: IRRIGATION AND DEBRIDEMENT EXTREMITY;  Surgeon: Katha Cabal, MD;  Location: [facility];  Service: Vascular;  Laterality: Left;  . INSERTION OF DIALYSIS CATHETER  05/30/2017  . INSERTION OF DIALYSIS CATHETER N/A 05/30/2017   Procedure: INSERTION OF DIALYSIS CATHETER;  Surgeon: Elam Dutch, MD;  Location: [facility];  Service: Vascular;  Laterality: N/A;  . IR PARACENTESIS  08/30/2017  . IR PARACENTESIS  09/29/2017  . IR PARACENTESIS  10/28/2017  . IR PARACENTESIS  11/09/2017  . IR PARACENTESIS  11/16/2017  . IR PARACENTESIS  11/28/2017  . IR PARACENTESIS  12/01/2017  . IR PARACENTESIS  12/06/2017  . IR PARACENTESIS  01/03/2018  . IR PARACENTESIS  01/23/2018  . IR PARACENTESIS  02/07/2018  . IR PARACENTESIS  02/21/2018  . IR PARACENTESIS  03/06/2018  . IR PARACENTESIS  03/17/2018  . IR PARACENTESIS  04/04/2018  . IR PARACENTESIS  12/28/2018  . IR PARACENTESIS  01/08/2019  . IR PARACENTESIS  01/23/2019  . IR PARACENTESIS  02/01/2019  . IR PARACENTESIS  02/19/2019  . IR PARACENTESIS  03/01/2019  . IR PARACENTESIS  03/15/2019  . IR PARACENTESIS  04/03/2019  . IR PARACENTESIS  04/12/2019  . IR PARACENTESIS  05/01/2019  . IR PARACENTESIS  05/08/2019  . IR PARACENTESIS  05/24/2019  . IR RADIOLOGIST EVAL & MGMT  02/14/2018  . IR RADIOLOGIST EVAL & MGMT  02/22/2019  . LEFT HEART CATH AND CORONARY ANGIOGRAPHY N/A 02/22/2017   Procedure: LEFT HEART CATH AND CORONARY ANGIOGRAPHY;  Surgeon: Nigel Mormon, MD;  Location: [facility];  Service: Cardiovascular;  Laterality: N/A;  . LIGATION OF ARTERIOVENOUS  FISTULA Left 0/08/6376   Procedure: Plication of Left Arm Arteriovenous Fistula;  Surgeon: Elam Dutch, MD;  Location: [facility];  Service: Vascular;  Laterality: Left;  .  POLYPECTOMY    . POLYPECTOMY  01/25/2018   Procedure: POLYPECTOMY;  Surgeon: Jerene Bears, MD;  Location: [facility];  Service: Gastroenterology;;  . REVISON OF ARTERIOVENOUS FISTULA Left 5/88/5027   Procedure: PLICATION OF DISTAL ANEURYSMAL SEGEMENT OF LEFT UPPER ARM ARTERIOVENOUS FISTULA;  Surgeon: Elam Dutch, MD;  Location: [facility];  Service: Vascular;  Laterality: Left;  . REVISON OF ARTERIOVENOUS FISTULA Left 7/41/2878   Procedure: Plication of Left Upper Arm Fistula ;  Surgeon: Waynetta Sandy, MD;  Location: [facility];  Service: Vascular;  Laterality:  Left;  . SKIN GRAFT SPLIT THICKNESS LEG / FOOT Left    SKIN GRAFT SPLIT THICKNESS LEFT ARM DONOR SITE: LEFT ANTERIOR THIGH  . SKIN SPLIT GRAFT Left 07/04/2017   Procedure: SKIN GRAFT SPLIT THICKNESS LEFT ARM DONOR SITE: LEFT ANTERIOR THIGH;  Surgeon: Elam Dutch, MD;  Location: [facility];  Service: Vascular;  Laterality: Left;  . THROMBECTOMY W/ EMBOLECTOMY Left 06/05/2017   Procedure: EXPLORATION OF LEFT ARM FOR BLEEDING; OVERSEWED PROXIMAL FISTULA;  Surgeon: Angelia Mould, MD;  Location: [facility];  Service: Vascular;  Laterality: Left;  . WOUND EXPLORATION Left 06/03/2017   Procedure: WOUND EXPLORATION WITH WOUND VAC APPLICATION TO LEFT ARM;  Surgeon: Angelia Mould, MD;  Location: [facility];  Service: Vascular;  Laterality: Left;        Home Medications    Prior to Admission medications   Medication Sig Start Date End Date Taking? Authorizing Provider  albuterol (VENTOLIN HFA) 108 (90 Base) MCG/ACT inhaler Inhale 2 puffs into the lungs every 6 (six) hours as needed for wheezing or shortness of breath.    [provider]  hydrALAZINE (APRESOLINE) 100 MG tablet Take 100 mg by mouth 3 (three) times daily.  02/13/19   [provider]  lactulose (CHRONULAC) 10 GM/15ML solution TAKE 45 MLS BY MOUTH 2 TIMES DAILY. 05/25/19   Pyrtle, Lajuan Lines, MD  metoprolol tartrate (LOPRESSOR) 25 MG tablet Take 1 tablet  (25 mg total) by mouth 2 (two) times daily. 03/11/18   Geradine Girt, DO  montelukast (SINGULAIR) 10 MG tablet Take 10 mg by mouth every evening.     [provider]  ondansetron (ZOFRAN ODT) 4 MG disintegrating tablet Take 1 tablet (4 mg total) by mouth every 8 (eight) hours as needed for nausea or vomiting. 05/07/19   Fredia Sorrow, MD  pantoprazole (PROTONIX) 40 MG tablet Take 1 tablet (40 mg total) by mouth daily. Patient taking differently: Take 40 mg by mouth every evening.  03/22/19   Zehr, Laban Emperor, PA-C  sevelamer carbonate (RENVELA) 800 MG tablet Take 4 tablets with meals  And 2 tablets twice daily with snacks Patient taking differently: Take 1,600 mg by mouth See admin instructions. Take 2 tablets (1600mg ) with meals & with snacks 01/22/18   Patrecia Pour, MD  sulfamethoxazole-trimethoprim (BACTRIM DS) 800-160 MG tablet Take 1 tablet by mouth daily. Patient taking differently: Take 1 tablet by mouth 2 (two) times daily.  12/30/18   Geradine Girt, DO  SYMBICORT 160-4.5 MCG/ACT inhaler Inhale 2 puffs into the lungs 2 (two) times daily.  11/30/18   [provider]  zolpidem (AMBIEN) 10 MG tablet Take 10 mg by mouth at bedtime as needed for sleep.    [provider]    Family History Family History  Problem Relation Age of Onset  . Heart disease Mother   . Lung cancer Mother   . Heart disease Father   . Malignant hyperthermia Father   . COPD Father   . Throat cancer Sister   . Esophageal cancer Sister   . Hypertension Other   . COPD Other   . Colon cancer Neg Hx   . Colon polyps Neg Hx   . Rectal cancer Neg Hx   . Stomach cancer Neg Hx     Social History Social History   Tobacco Use  . Smoking status: Current Every Day Smoker    Packs/day: 0.50    Years: 43.00    Pack years: 21.50  Types: Cigarettes    Start date: 08/13/1973  . Smokeless tobacco: Never Used  . Tobacco comment: i dont know i just make   Substance Use Topics  . Alcohol  use: Not Currently    Frequency: Never    Comment: quit drinking in 2017  . Drug use: Yes    Types: Marijuana, Cocaine    Comment: reports using once every 3 months, 04-06-2019 was this      Allergies   Morphine and related, Aspirin, Clonidine derivatives, Tramadol, and Tylenol [acetaminophen]   Review of Systems Review of Systems  Constitutional: Negative for chills and fever.  Respiratory: Positive for shortness of breath. Negative for cough.   Cardiovascular: Positive for chest pain and palpitations.  Gastrointestinal: Positive for nausea. Negative for abdominal pain and vomiting.  Musculoskeletal: Negative for arthralgias and back pain.  Skin: Negative for pallor and rash.  Neurological: Positive for light-headedness. Negative for syncope.  All other systems reviewed and are negative.    Physical Exam Updated Vital Signs BP 139/86 (BP Location: Left Leg)   Pulse 87   Temp 98.6 F (37 C) (Oral)   Resp 18   Ht 5\' 10"  (1.778 m)   Wt 64.5 kg   SpO2 100%   BMI 20.40 kg/m   Physical Exam Vitals signs and nursing note reviewed.  Constitutional:      Appearance: He is well-developed.  HENT:     Head: Normocephalic and atraumatic.  Eyes:     Conjunctiva/sclera: Conjunctivae normal.  Neck:     Musculoskeletal: Neck supple.  Cardiovascular:     Rate and Rhythm: Normal rate. Rhythm irregular.     Heart sounds: No murmur.  Pulmonary:     Effort: Pulmonary effort is normal. No respiratory distress.     Breath sounds: Normal breath sounds.     Comments: 97% on room air Abdominal:     Palpations: Abdomen is soft.     Tenderness: There is no abdominal tenderness.     Comments: Distended abdomen, soft, nontender, fluid wave  Musculoskeletal:     Comments: Dialysis site left arm, fistula patent  Skin:    General: Skin is warm and dry.  Neurological:     General: No focal deficit present.     Mental Status: He is alert and oriented to person, place, and time.       ED Treatments / Results  Labs (all labs ordered are listed, but only abnormal results are displayed) Labs Reviewed  BRAIN NATRIURETIC PEPTIDE - Abnormal; Notable for the following components:      Result Value   B Natriuretic Peptide >4,500.0 (*)    All other components within normal limits  CBC WITH DIFFERENTIAL/PLATELET - Abnormal; Notable for the following components:   RBC 3.52 (*)    Hemoglobin 9.6 (*)    HCT 27.2 (*)    MCV 77.3 (*)    Platelets 131 (*)    All other components within normal limits  BASIC METABOLIC PANEL - Abnormal; Notable for the following components:   Glucose, Bld 108 (*)    BUN 23 (*)    Creatinine, Ser 5.73 (*)    GFR calc non Af Amer 10 (*)    GFR calc Af Amer 12 (*)    All other components within normal limits  TROPONIN I (HIGH SENSITIVITY) - Abnormal; Notable for the following components:   Troponin I (High Sensitivity) 118 (*)    All other components within normal limits  TROPONIN I (HIGH  SENSITIVITY) - Abnormal; Notable for the following components:   Troponin I (High Sensitivity) 111 (*)    All other components within normal limits  MAGNESIUM    EKG EKG Interpretation  Date/Time:  Tuesday May 29 2019 08:35:46 EST Ventricular Rate:  89 PR Interval:    QRS Duration: 167 QT Interval:  498 QTC Calculation: 607 R Axis:   -72 Text Interpretation: Sinus rhythm Borderline prolonged PR interval Biatrial enlargement Left bundle branch block - noted on prior ecg May 27, 2019 Does not meet sgarbossa criteria for STEMI Confirmed by Octaviano Glow ([id number]) on 05/29/2019 8:45:48 AM   Radiology Dg Chest Portable 1 View  Result Date: 05/29/2019 CLINICAL DATA:  Dyspnea EXAM: PORTABLE CHEST 1 VIEW COMPARISON:  05/27/2019 chest radiograph. FINDINGS: Stable cardiomediastinal silhouette with moderate cardiomegaly. No pneumothorax. No pleural effusion. Mild pulmonary edema is slightly improved. Mild left lung base atelectasis. IMPRESSION: 1. Moderate  cardiomegaly.  Mild pulmonary edema, slightly improved. 2. Stable mild left lung base atelectasis. Electronically Signed   By: Ilona Sorrel M.D.   On: 05/29/2019 09:48    Procedures Procedures (including critical care time)  Medications Ordered in ED Medications - No data to display   Initial Impression / Assessment and Plan / ED Course  I have reviewed the triage vital signs and the nursing notes.  Pertinent labs & imaging results that were available during my care of the patient were reviewed by me and considered in my medical decision making (see chart for details).  58 year old with a history of end-stage renal disease on dialysis, A. fib on metoprolol, presenting to emergency department with rapid A. fib and hypotension.  This reportedly began this morning spontaneously.  EMS gave the patient 20 mg of IV diltiazem in route, with resolution of the patient's A. fib on his arrival.  My exam the patient's heart rate was in the 80s and irregular.  He was asymptomatic.  His lungs are clear to auscultation.  He does not examine his of his volume overloaded.  He was recently dialysis 2 days ago on an emergent basis found to be hyperkalemic at the time.  He is not currently due for dialysis  Plan to give the patient his p.o. metoprolol here.  We will monitor his A. fib.  The patient is immediately requesting to leave the emergency department after his arrival, but I explained that we do need to monitor him here as he may revert into rapid A. fib at home.  Clinical Course as of May 29 1647  Tue May 29, 2019  1009 Reverted into A Fib (on 1005 ecg) and then spontaneously out of A Fib, BP stable.   [MT]  1026 Trop 118 in setting of A Fib episodes, likely related to mild demand ischemia or ESRD, patient has no symptoms now, I do not believe he needs IV heparin.  We will repeat the troponin at 2 hours of trended.   [MT]  1026 Of note the patient's potassium was within normal limits of 4.7.  I will  give him his morning dose of metoprolol 25 mg p.o.   [MT]  1229 No significant increase in troponins.  Patient has been seen by cardiology Dr. Virgina Jock. Per his recommendation, okay to discharge home as rate is controlled now.  Patient is otherwise asymptomatic.  He is not in A. fib.  Patient reminded of importance of attending dialysis also taking home medication as prescribed.   [MT]    Clinical Course User Index [MT] Shaheer Bonfield,  Carola Rhine, MD    Final Clinical Impressions(s) / ED Diagnoses   Final diagnoses:  ESRD (end stage renal disease) ([place])  Atrial fibrillation, unspecified type ([place])  SOB (shortness of breath)    ED Discharge Orders    None       Wyvonnia Dusky, MD 05/29/19 [phone]

## 2019-05-29 NOTE — ED Triage Notes (Signed)
Pt here from home, just d/c from ED- pt went home and was getting out of bed when he felt his heart racing again. Arrives with HR in 140s, hypotensive.

## 2019-05-29 NOTE — ED Triage Notes (Signed)
Pt arrives via EMS from home with reports of SOB, pt HR found to be 170s. Pt initially coughed and converted by himself. He went back into 160s and 20 mg cardizem given by EMS. 8 puff on home inhaler

## 2019-05-29 NOTE — H&P (Signed)
Date: 05/29/2019               Patient Name:  [name] MRN: [id number]  DOB: 10/17/1960 Age / Sex: 58 y.o., male   PCP: [name], FNP         Medical Service: Internal Medicine Teaching Service         Attending Physician: Dr. Lucious Groves, DO    First Contact: Dr.  Tamsen Snider Pager: [phone]  Second Contact: Dr. Modena Nunnery Pager: [phone]       After Hours (After 5p/  First Contact Pager: [phone]  weekends / holidays): Second Contact Pager: [phone]   Chief Complaint: My heart is beating fast  History of Present Illness: Ruairi Stutsman.Soderquist is a 58 year old male with past medical history of decompensated cirrhosis with recurrent ascites and history of SBP, ESRD on HD, paroxysmal atrial fibrillation, COPD, HTN, and CAD status post stent 02/2017 who presented with a fast heart beat and found to be tachycardic. Treated for Afib with RVR.  Patient seen earlier today in the ED. He was given 20 mg of IV diltiazem by EMS and then started on home Metoprolol. Patient left and returned to the ED with fast heart rate. Patient was given diltiazem blous and drip.  Patient is abrupt on interview. He was recently admitted for hyperkalemia due to missed dialysis session. Pt discharged yesterday after dialysis. Reports he has been compliant on metoprolol and has the medication at home. Reports chronic shortness of breath from COPD, but denies any recent change. Denies chest pain, abdominal pain, or changes in bowels.   Meds:  Current Facility-Administered Medications on File Prior to Encounter  Medication Dose Route Frequency Provider Last Rate Last Dose  . albumin human 25 % solution 50 g  50 g Intravenous UD Pyrtle, Lajuan Lines, MD       Current Outpatient Medications on File Prior to Encounter  Medication Sig Dispense Refill  . albuterol (VENTOLIN HFA) 108 (90 Base) MCG/ACT inhaler Inhale 2 puffs into the lungs every 6 (six) hours as needed for wheezing or shortness of  breath.    . hydrALAZINE (APRESOLINE) 100 MG tablet Take 100 mg by mouth 3 (three) times daily.     Marland Kitchen lactulose (CHRONULAC) 10 GM/15ML solution TAKE 45 MLS BY MOUTH 2 TIMES DAILY. 8100 mL 0  . metoprolol tartrate (LOPRESSOR) 25 MG tablet Take 1 tablet (25 mg total) by mouth 2 (two) times daily. 60 tablet 0  . montelukast (SINGULAIR) 10 MG tablet Take 10 mg by mouth every evening.     . ondansetron (ZOFRAN ODT) 4 MG disintegrating tablet Take 1 tablet (4 mg total) by mouth every 8 (eight) hours as needed for nausea or vomiting. 10 tablet 1  . pantoprazole (PROTONIX) 40 MG tablet Take 1 tablet (40 mg total) by mouth daily. (Patient taking differently: Take 40 mg by mouth every evening. ) 90 tablet 3  . sevelamer carbonate (RENVELA) 800 MG tablet Take 4 tablets with meals  And 2 tablets twice daily with snacks (Patient taking differently: Take 1,600 mg by mouth See admin instructions. Take 2 tablets (1600mg ) with meals & with snacks)    . sulfamethoxazole-trimethoprim (BACTRIM DS) 800-160 MG tablet Take 1 tablet by mouth daily. (Patient taking differently: Take 1 tablet by mouth 2 (two) times daily. ) 30 tablet 11  . SYMBICORT 160-4.5 MCG/ACT inhaler Inhale 2 puffs into the lungs 2 (two) times daily.     Marland Kitchen  zolpidem (AMBIEN) 10 MG tablet Take 10 mg by mouth at bedtime as needed for sleep.      Current Facility-Administered Medications for the 05/29/19 encounter [facility] Encounter)  Medication  . albumin human 25 % solution 50 g   No outpatient medications have been marked as taking for the 05/29/19 encounter [facility] Encounter).     Allergies: Allergies as of 05/29/2019 - Review Complete 05/29/2019  Allergen Reaction Noted  . Morphine and related Other (See Comments) 12/23/2017  . Aspirin Other (See Comments) 02/27/2014  . Clonidine derivatives Itching 08/05/2013  . Tramadol Itching 02/25/2011  . Tylenol [acetaminophen] Nausea Only 10/27/2017   Past Medical History:  Diagnosis Date  .  Anemia   . Anxiety   . Arthritis    left shoulder  . Atherosclerosis of aorta ([place])   . Cardiomegaly   . Chest pain    DATE UNKNOWN, C/O PERIODICALLY  . Cocaine abuse ([place])   . COPD exacerbation ([place]) 08/17/2016  . Coronary artery disease    stent 02/22/17  . ESRD (end stage renal disease) on dialysis ([place])    "[name]; MWF" (07/04/2017)  . GERD (gastroesophageal reflux disease)    DATE UNKNOWN  . Hemorrhoids   . Hepatitis B, chronic ([place])   . Hepatitis C   . History of kidney stones   . Hyperkalemia   . Hypertension   . Kidney failure   . Metabolic bone disease    Patient denies  . Mitral stenosis   . Myocardial infarction ([place])   . Pneumonia   . Pulmonary edema   . Solitary rectal ulcer syndrome 07/2017   at [facility] for rectal bleeding  . Tubular adenoma of colon     Family History:  Family History  Problem Relation Age of Onset  . Heart disease Mother   . Lung cancer Mother   . Heart disease Father   . Malignant hyperthermia Father   . COPD Father   . Throat cancer Sister   . Esophageal cancer Sister   . Hypertension Other   . COPD Other   . Colon cancer Neg Hx   . Colon polyps Neg Hx   . Rectal cancer Neg Hx   . Stomach cancer Neg Hx      Social History:  Social History   Tobacco Use  . Smoking status: Current Every Day Smoker    Packs/day: 0.50    Years: 43.00    Pack years: 21.50    Types: Cigarettes    Start date: 08/13/1973  . Smokeless tobacco: Never Used  . Tobacco comment: i dont know i just make   Substance Use Topics  . Alcohol use: Not Currently    Frequency: Never    Comment: quit drinking in 2017  . Drug use: Yes    Types: Marijuana, Cocaine    Comment: reports using once every 3 months, 04-06-2019 was this      Review of Systems: A complete ROS was negative except as per HPI.   Physical Exam: Blood pressure 98/60, pulse 78, temperature 98.2 F (36.8 C), temperature source Oral, resp. rate 14, height 5\' 10"  (1.778 m),  weight 64.5 kg, SpO2 94 %..  Gen: Patient resting in bed, appears tired and agitated when asked to stay awake  Cardiovascular: Tachycardic Pulmonary: Uncooperative , will not allow auscultation. Not using accessory muscle, sat well on RA GI: normal bowel sounds , not tender Skin: warm and dry Psych: Uncooperative, appears agitated   EKG: personally reviewed  my interpretation is rate 150 bpm , left bundle branch block, cant appreciate p-waves.   Assessment & Plan by Problem: Active Problems:   Atrial fibrillation with RVR ([place])  Barrie Folk.Carrico is a 58 year old male with past medical history of decompensated cirrhosis with recurrent ascites and history of SBP, ESRD on HD, paroxysmal atrial fibrillation, COPD, HTN, and CAD status post stent 02/2017 who presented with tachycardia.  #Afib/Aflutter  Patient with history of Afib, rate appears regular at 150 bmp on ekg, can not appreciate P-waves. Stable on exam. Start on Diltiazem drip and rate is better controlled. Will repeat EKG now that rate is better controlled. Patient with history of Afib , possible patient is volume down with recent nausea and vomiting.  Denies chest pain.  - Repeat EKG - Trend troponin  #ESRD on HD M/W/F - consult nephrology tomorrow for HD  #Hx of Cirrhosis  - continued on SBP ppx, lactulose; no evidence of acute decompensation   Dispo: Admit patient to Observation with expected length of stay less than 2 midnights.  Signed:  Tamsen Snider, MD PGY1  [phone]

## 2019-05-29 NOTE — ED Provider Notes (Signed)
[facility] EMERGENCY DEPARTMENT Provider Note   CSN: [id number] Arrival date & time: 05/29/19  1602     History   Chief Complaint No chief complaint on file.   HPI [name] is a 58 y.o. male hx of ESRD on HD, COPD, who presented with palpitation.  Patient was seen earlier today and was diagnosed with rapid atrial fibrillation.  Patient was given Cardizem and heart rate normalized.  Patient was seen by cardiology and recommended metoprolol.  It is unclear if patient left AGAINST MEDICAL ADVICE or was discharged.  When patient got home, he had rapid heart rate again.  He was noted to be in rapid A. fib with RVR. Patient was just discharged from the hospital yesterday       The history is provided by the patient.    Past Medical History:  Diagnosis Date  . Anemia   . Anxiety   . Arthritis    left shoulder  . Atherosclerosis of aorta ([place])   . Cardiomegaly   . Chest pain    DATE UNKNOWN, C/O PERIODICALLY  . Cocaine abuse ([place])   . COPD exacerbation ([place]) 08/17/2016  . Coronary artery disease    stent 02/22/17  . ESRD (end stage renal disease) on dialysis ([place])    "[name]; MWF" (07/04/2017)  . GERD (gastroesophageal reflux disease)    DATE UNKNOWN  . Hemorrhoids   . Hepatitis B, chronic ([place])   . Hepatitis C   . History of kidney stones   . Hyperkalemia   . Hypertension   . Kidney failure   . Metabolic bone disease    Patient denies  . Mitral stenosis   . Myocardial infarction ([place])   . Pneumonia   . Pulmonary edema   . Solitary rectal ulcer syndrome 07/2017   at flex sig for rectal bleeding  . Tubular adenoma of colon     Patient Active Problem List   Diagnosis Date Noted  . Melena   . Pressure injury of skin 03/09/2019  . Abdominal distention   . Volume overload 12/28/2018  . Sepsis ([place]) 09/12/2018  . Atherosclerosis of native coronary artery of native heart without angina pectoris 03/11/2018  . Benign neoplasm of  cecum   . Benign neoplasm of ascending colon   . Benign neoplasm of descending colon   . Benign neoplasm of rectum   . Paroxysmal atrial fibrillation ([place]) 01/23/2018  . Hx of colonic polyps 01/20/2018  . ESRD (end stage renal disease) ([place]) 11/21/2017  . GERD (gastroesophageal reflux disease) 11/16/2017  . Liver cirrhosis ([place]) 11/15/2017  . DNR (do not resuscitate)   . Palliative care by specialist   . Hyponatremia 11/04/2017  . SBP (spontaneous bacterial peritonitis) ([place]) 10/30/2017  . Liver disease, chronic 10/30/2017  . SOB (shortness of breath)   . Abdominal pain 10/28/2017  . Upper airway cough syndrome with flattening on f/v loop 10/13/17 c/w vcd 10/17/2017  . Elevated diaphragm 10/13/2017  . Ileus ([place]) 09/29/2017  . Malnutrition of moderate degree 09/29/2017  . Sinus congestion 09/03/2017  . Symptomatic anemia 09/02/2017  . Other cirrhosis of liver ([place]) 09/02/2017  . Left bundle branch block 09/02/2017  . Mitral stenosis 09/02/2017  . Hematochezia 07/15/2017  . Wide-complex tachycardia ([place])   . Endotracheally intubated   . ESRD on dialysis ([place]) 07/04/2017  . Acute respiratory failure with hypoxia ([place]) 06/18/2017  . CKD (chronic kidney disease) stage V requiring chronic dialysis ([place]) 06/18/2017  . History of  Cocaine abuse ([place]) 06/18/2017  . Hypertension 06/18/2017  . Infection of AV graft for dialysis ([place]) 06/18/2017  . Anxiety 06/18/2017  . Anemia due to chronic kidney disease 06/18/2017  . Atrial flutter with rapid ventricular response ([place]) 06/18/2017  . Personality disorder ([place]) 06/13/2017  . Cellulitis 06/12/2017  . Adjustment disorder with mixed anxiety and depressed mood 06/10/2017  . Suicidal ideation 06/10/2017  . Arm wound, left, sequela 06/10/2017  . Dyspnea on exertion 05/29/2017  . Tachycardia 05/29/2017  . Hyperkalemia 05/22/2017  . Acute metabolic encephalopathy   . Anemia 04/23/2017  . Ascites 04/23/2017  . COPD (chronic obstructive  pulmonary disease) ([place]) 04/23/2017  . Acute on chronic respiratory failure with hypoxia ([place]) 03/25/2017  . Arrhythmia 03/25/2017  . COPD GOLD 0 with flattening on inps f/v  09/27/2016  . Essential hypertension 09/27/2016  . Fluid overload 08/30/2016  . COPD exacerbation ([place]) 08/17/2016  . Hypertensive urgency 08/17/2016  . Respiratory failure ([place]) 08/17/2016  . Problem with dialysis access ([place]) 07/23/2016  . Chronic hepatitis B ([place]) 03/05/2014  . Chronic hepatitis C without hepatic coma ([place]) 03/05/2014  . Internal hemorrhoids with bleeding, swelling and itching 03/05/2014  . Thrombocytopenia ([place]) 03/05/2014  . Chest pain 02/27/2014  . Alcohol abuse 04/14/2009  . Cigarette smoker 04/14/2009  . GANGLION CYST 04/14/2009    Past Surgical History:  Procedure Laterality Date  . A/V FISTULAGRAM Left 05/26/2017   Procedure: A/V FISTULAGRAM;  Surgeon: Conrad Union, MD;  Location: [facility];  Service: Cardiovascular;  Laterality: Left;  . A/V FISTULAGRAM Right 11/18/2017   Procedure: A/V FISTULAGRAM - Right Arm;  Surgeon: Elam Dutch, MD;  Location: [facility];  Service: Cardiovascular;  Laterality: Right;  . APPLICATION OF WOUND VAC Left 06/14/2017   Procedure: APPLICATION OF WOUND VAC;  Surgeon: Katha Cabal, MD;  Location: [facility];  Service: Vascular;  Laterality: Left;  . AV FISTULA PLACEMENT  2012   BELIEVED WAS PLACED IN JUNE  . AV FISTULA PLACEMENT Right 08/09/2017   Procedure: Creation Right arm ARTERIOVENOUS BRACHIOCEPOHALIC FISTULA;  Surgeon: Elam Dutch, MD;  Location: [facility] OR;  Service: Vascular;  Laterality: Right;  . AV FISTULA PLACEMENT Right 11/22/2017   Procedure: INSERTION OF ARTERIOVENOUS (AV) GORE-TEX GRAFT RIGHT UPPER ARM;  Surgeon: Elam Dutch, MD;  Location: [facility];  Service: Vascular;  Laterality: Right;  . BIOPSY  01/25/2018   Procedure: BIOPSY;  Surgeon: Jerene Bears, MD;  Location: [facility];  Service:  Gastroenterology;;  . BIOPSY  04/10/2019   Procedure: BIOPSY;  Surgeon: Jerene Bears, MD;  Location: [facility];  Service: Gastroenterology;;  . COLONOSCOPY    . COLONOSCOPY WITH PROPOFOL N/A 01/25/2018   Procedure: COLONOSCOPY WITH PROPOFOL;  Surgeon: Jerene Bears, MD;  Location: [facility];  Service: Gastroenterology;  Laterality: N/A;  . CORONARY STENT INTERVENTION N/A 02/22/2017   Procedure: CORONARY STENT INTERVENTION;  Surgeon: Nigel Mormon, MD;  Location: [facility];  Service: Cardiovascular;  Laterality: N/A;  . ESOPHAGOGASTRODUODENOSCOPY (EGD) WITH PROPOFOL N/A 01/25/2018   Procedure: ESOPHAGOGASTRODUODENOSCOPY (EGD) WITH PROPOFOL;  Surgeon: Jerene Bears, MD;  Location: [facility];  Service: Gastroenterology;  Laterality: N/A;  . ESOPHAGOGASTRODUODENOSCOPY (EGD) WITH PROPOFOL N/A 04/10/2019   Procedure: ESOPHAGOGASTRODUODENOSCOPY (EGD) WITH PROPOFOL;  Surgeon: Jerene Bears, MD;  Location: [facility];  Service: Gastroenterology;  Laterality: N/A;  . FLEXIBLE SIGMOIDOSCOPY N/A 07/15/2017   Procedure: FLEXIBLE SIGMOIDOSCOPY;  Surgeon: Carol Ada, MD;  Location: [facility]  ENDOSCOPY;  Service: Endoscopy;  Laterality: N/A;  . HEMORRHOID BANDING    . I&D EXTREMITY Left 06/01/2017   Procedure: IRRIGATION AND DEBRIDEMENT LEFT ARM HEMATOMA WITH LIGATION OF LEFT ARM AV FISTULA;  Surgeon: Elam Dutch, MD;  Location: [facility];  Service: Vascular;  Laterality: Left;  . I&D EXTREMITY Left 06/14/2017   Procedure: IRRIGATION AND DEBRIDEMENT EXTREMITY;  Surgeon: Katha Cabal, MD;  Location: [facility];  Service: Vascular;  Laterality: Left;  . INSERTION OF DIALYSIS CATHETER  05/30/2017  . INSERTION OF DIALYSIS CATHETER N/A 05/30/2017   Procedure: INSERTION OF DIALYSIS CATHETER;  Surgeon: Elam Dutch, MD;  Location: [facility];  Service: Vascular;  Laterality: N/A;  . IR PARACENTESIS  08/30/2017  . IR PARACENTESIS  09/29/2017  . IR PARACENTESIS  10/28/2017  . IR PARACENTESIS   11/09/2017  . IR PARACENTESIS  11/16/2017  . IR PARACENTESIS  11/28/2017  . IR PARACENTESIS  12/01/2017  . IR PARACENTESIS  12/06/2017  . IR PARACENTESIS  01/03/2018  . IR PARACENTESIS  01/23/2018  . IR PARACENTESIS  02/07/2018  . IR PARACENTESIS  02/21/2018  . IR PARACENTESIS  03/06/2018  . IR PARACENTESIS  03/17/2018  . IR PARACENTESIS  04/04/2018  . IR PARACENTESIS  12/28/2018  . IR PARACENTESIS  01/08/2019  . IR PARACENTESIS  01/23/2019  . IR PARACENTESIS  02/01/2019  . IR PARACENTESIS  02/19/2019  . IR PARACENTESIS  03/01/2019  . IR PARACENTESIS  03/15/2019  . IR PARACENTESIS  04/03/2019  . IR PARACENTESIS  04/12/2019  . IR PARACENTESIS  05/01/2019  . IR PARACENTESIS  05/08/2019  . IR PARACENTESIS  05/24/2019  . IR RADIOLOGIST EVAL & MGMT  02/14/2018  . IR RADIOLOGIST EVAL & MGMT  02/22/2019  . LEFT HEART CATH AND CORONARY ANGIOGRAPHY N/A 02/22/2017   Procedure: LEFT HEART CATH AND CORONARY ANGIOGRAPHY;  Surgeon: Nigel Mormon, MD;  Location: [facility];  Service: Cardiovascular;  Laterality: N/A;  . LIGATION OF ARTERIOVENOUS  FISTULA Left 12/15/2945   Procedure: Plication of Left Arm Arteriovenous Fistula;  Surgeon: Elam Dutch, MD;  Location: [facility];  Service: Vascular;  Laterality: Left;  . POLYPECTOMY    . POLYPECTOMY  01/25/2018   Procedure: POLYPECTOMY;  Surgeon: Jerene Bears, MD;  Location: [facility];  Service: Gastroenterology;;  . REVISON OF ARTERIOVENOUS FISTULA Left 6/54/6503   Procedure: PLICATION OF DISTAL ANEURYSMAL SEGEMENT OF LEFT UPPER ARM ARTERIOVENOUS FISTULA;  Surgeon: Elam Dutch, MD;  Location: [facility];  Service: Vascular;  Laterality: Left;  . REVISON OF ARTERIOVENOUS FISTULA Left 5/46/5681   Procedure: Plication of Left Upper Arm Fistula ;  Surgeon: Waynetta Sandy, MD;  Location: [facility];  Service: Vascular;  Laterality: Left;  . SKIN GRAFT SPLIT THICKNESS LEG / FOOT Left    SKIN GRAFT SPLIT THICKNESS LEFT ARM DONOR SITE: LEFT ANTERIOR THIGH  .  SKIN SPLIT GRAFT Left 07/04/2017   Procedure: SKIN GRAFT SPLIT THICKNESS LEFT ARM DONOR SITE: LEFT ANTERIOR THIGH;  Surgeon: Elam Dutch, MD;  Location: [facility];  Service: Vascular;  Laterality: Left;  . THROMBECTOMY W/ EMBOLECTOMY Left 06/05/2017   Procedure: EXPLORATION OF LEFT ARM FOR BLEEDING; OVERSEWED PROXIMAL FISTULA;  Surgeon: Angelia Mould, MD;  Location: [facility];  Service: Vascular;  Laterality: Left;  . WOUND EXPLORATION Left 06/03/2017   Procedure: WOUND EXPLORATION WITH WOUND VAC APPLICATION TO LEFT ARM;  Surgeon: Angelia Mould, MD;  Location: [facility];  Service: Vascular;  Laterality:  Left;        Home Medications    Prior to Admission medications   Medication Sig Start Date End Date Taking? Authorizing Provider  albuterol (VENTOLIN HFA) 108 (90 Base) MCG/ACT inhaler Inhale 2 puffs into the lungs every 6 (six) hours as needed for wheezing or shortness of breath.    [provider]  hydrALAZINE (APRESOLINE) 100 MG tablet Take 100 mg by mouth 3 (three) times daily.  02/13/19   [provider]  lactulose (CHRONULAC) 10 GM/15ML solution TAKE 45 MLS BY MOUTH 2 TIMES DAILY. 05/25/19   Pyrtle, Lajuan Lines, MD  metoprolol tartrate (LOPRESSOR) 25 MG tablet Take 1 tablet (25 mg total) by mouth 2 (two) times daily. 03/11/18   Geradine Girt, DO  montelukast (SINGULAIR) 10 MG tablet Take 10 mg by mouth every evening.     [provider]  ondansetron (ZOFRAN ODT) 4 MG disintegrating tablet Take 1 tablet (4 mg total) by mouth every 8 (eight) hours as needed for nausea or vomiting. 05/07/19   Fredia Sorrow, MD  pantoprazole (PROTONIX) 40 MG tablet Take 1 tablet (40 mg total) by mouth daily. Patient taking differently: Take 40 mg by mouth every evening.  03/22/19   Zehr, Laban Emperor, PA-C  sevelamer carbonate (RENVELA) 800 MG tablet Take 4 tablets with meals  And 2 tablets twice daily with snacks Patient taking differently: Take 1,600 mg by mouth See admin  instructions. Take 2 tablets (1600mg ) with meals & with snacks 01/22/18   Patrecia Pour, MD  sulfamethoxazole-trimethoprim (BACTRIM DS) 800-160 MG tablet Take 1 tablet by mouth daily. Patient taking differently: Take 1 tablet by mouth 2 (two) times daily.  12/30/18   Geradine Girt, DO  SYMBICORT 160-4.5 MCG/ACT inhaler Inhale 2 puffs into the lungs 2 (two) times daily.  11/30/18   [provider]  zolpidem (AMBIEN) 10 MG tablet Take 10 mg by mouth at bedtime as needed for sleep.    [provider]    Family History Family History  Problem Relation Age of Onset  . Heart disease Mother   . Lung cancer Mother   . Heart disease Father   . Malignant hyperthermia Father   . COPD Father   . Throat cancer Sister   . Esophageal cancer Sister   . Hypertension Other   . COPD Other   . Colon cancer Neg Hx   . Colon polyps Neg Hx   . Rectal cancer Neg Hx   . Stomach cancer Neg Hx     Social History Social History   Tobacco Use  . Smoking status: Current Every Day Smoker    Packs/day: 0.50    Years: 43.00    Pack years: 21.50    Types: Cigarettes    Start date: 08/13/1973  . Smokeless tobacco: Never Used  . Tobacco comment: i dont know i just make   Substance Use Topics  . Alcohol use: Not Currently    Frequency: Never    Comment: quit drinking in 2017  . Drug use: Yes    Types: Marijuana, Cocaine    Comment: reports using once every 3 months, 04-06-2019 was this      Allergies   Morphine and related, Aspirin, Clonidine derivatives, Tramadol, and Tylenol [acetaminophen]   Review of Systems Review of Systems  Cardiovascular: Positive for palpitations.  All other systems reviewed and are negative.    Physical Exam Updated Vital Signs BP (!) 101/56   Pulse 72   Temp  98.2 F (36.8 C) (Oral)   Resp 18   Ht 5\' 10"  (1.778 m)   Wt 64.5 kg   SpO2 100%   BMI 20.40 kg/m   Physical Exam Vitals signs and nursing note reviewed.  Constitutional:       Comments: Chronically ill, tachycardic   HENT:     Head: Normocephalic.     Nose: Nose normal.     Mouth/Throat:     Mouth: Mucous membranes are moist.  Eyes:     Extraocular Movements: Extraocular movements intact.     Pupils: Pupils are equal, round, and reactive to light.  Neck:     Musculoskeletal: Normal range of motion.  Cardiovascular:     Comments: Tachy, irregular  Pulmonary:     Effort: Pulmonary effort is normal.  Abdominal:     General: Abdomen is flat.  Musculoskeletal: Normal range of motion.  Skin:    General: Skin is warm.     Capillary Refill: Capillary refill takes less than 2 seconds.  Neurological:     General: No focal deficit present.     Mental Status: He is oriented to person, place, and time.  Psychiatric:        Mood and Affect: Mood normal.      ED Treatments / Results  Labs (all labs ordered are listed, but only abnormal results are displayed) Labs Reviewed  CBC - Abnormal; Notable for the following components:      Result Value   RBC 3.90 (*)    Hemoglobin 10.7 (*)    HCT 30.5 (*)    MCV 78.2 (*)    All other components within normal limits  BASIC METABOLIC PANEL  TROPONIN I (HIGH SENSITIVITY)    EKG EKG Interpretation  Date/Time:  Tuesday May 29 2019 16:07:05 EST Ventricular Rate:  151 PR Interval:  56 QRS Duration: 152 QT Interval:  346 QTC Calculation: 548 R Axis:   -61 Text Interpretation: Unusual P axis, possible ectopic atrial tachycardia Left axis deviation Left bundle branch block Abnormal ECG No significant change since last tracing Confirmed by Wandra Arthurs [phone]) on 05/29/2019 4:39:02 PM   Radiology Dg Chest Portable 1 View  Result Date: 05/29/2019 CLINICAL DATA:  Dyspnea EXAM: PORTABLE CHEST 1 VIEW COMPARISON:  05/27/2019 chest radiograph. FINDINGS: Stable cardiomediastinal silhouette with moderate cardiomegaly. No pneumothorax. No pleural effusion. Mild pulmonary edema is slightly improved. Mild left lung  base atelectasis. IMPRESSION: 1. Moderate cardiomegaly.  Mild pulmonary edema, slightly improved. 2. Stable mild left lung base atelectasis. Electronically Signed   By: Ilona Sorrel M.D.   On: 05/29/2019 09:48    Procedures Procedures (including critical care time)  CRITICAL CARE Performed by: Wandra Arthurs   Total critical care time: 30 minutes  Critical care time was exclusive of separately billable procedures and treating other patients.  Critical care was necessary to treat or prevent imminent or life-threatening deterioration.  Critical care was time spent personally by me on the following activities: development of treatment plan with patient and/or surrogate as well as nursing, discussions with consultants, evaluation of patient's response to treatment, examination of patient, obtaining history from patient or surrogate, ordering and performing treatments and interventions, ordering and review of laboratory studies, ordering and review of radiographic studies, pulse oximetry and re-evaluation of patient's condition.   Medications Ordered in ED Medications  diltiazem (CARDIZEM) 125 mg in dextrose 5% 125 mL (1 mg/mL) infusion (5 mg/hr Intravenous New Bag/Given 05/29/19 1712)  sodium chloride flush (NS)  0.9 % injection 3 mL (3 mLs Intravenous Given 05/29/19 1712)  LORazepam (ATIVAN) injection 1 mg (1 mg Intravenous Given 05/29/19 1659)  diltiazem (CARDIZEM) injection 10 mg (10 mg Intravenous Given 05/29/19 1701)     Initial Impression / Assessment and Plan / ED Course  I have reviewed the triage vital signs and the nursing notes.  Pertinent labs & imaging results that were available during my care of the patient were reviewed by me and considered in my medical decision making (see chart for details).       Koleton Duchemin is a 58 y.o. male who presented with palpitations.  Per previous record earlier in the day, patient apparently went into rapid atrial fibrillation.   Patient actually does not have any history of atrial fibrillation.  On his initial EKG, is unclear if he is in A. fib or just rapid sinus tachycardia.  Reportedly was seen by cardiology earlier today but there was no consult note.  He is a known cocaine abuser.  We will try Cardizem bolus and drip and will likely need admission for recurrent tachycardia.   5:49 PM HR down to the 80-90s on cardizem drip. Internal medicine to readmit.   Final Clinical Impressions(s) / ED Diagnoses   Final diagnoses:  None    ED Discharge Orders    None       Drenda Freeze, MD 05/29/19 1750

## 2019-05-29 NOTE — Discharge Instructions (Signed)
Cardiology office will reach out to you to arrange for follow-up.  It is extremely important that you continue attending her dialysis as scheduled.  Missing dialysis can be life-threatening can lead to heart failure, problems breathing, and death.  Should also continue taking your blood pressure medicine at home including her metoprolol, which is your medicine for your A. Fib.  You should make every effort to stop smoking.  Smoking can trigger heart disease and lung problems.

## 2019-05-29 NOTE — Progress Notes (Addendum)
Patient seen at the bedside after being paged that his fingers were accidentally shut in the bathroom door.  Upon arriving to the room, the patient was resting in bed and appeared comfortable.  His right pinky and ring fingers were wrapped in gauze, and there was some blood on the guaze around the pinky. Patient said that he was experiencing pain, but did not feel like anything was broken. I examined his fingers and they were nontender to palpation. Grip strength was fully intact.  I do not feel x-rays indicated at this time, and the patient is in agreement. Will order a one-time dose of 2 mg of oral Dilaudid.  If his pain worsens, will reevaluate the need for x-rays.  Earlene Plater, MD Internal Medicine, PGY1 Pager: [phone]  05/29/2019,9:27 PM

## 2019-05-29 NOTE — ED Notes (Signed)
Report given to Carmell Austria, RN on 6E

## 2019-05-29 NOTE — ED Notes (Signed)
Pt expressing he wants to leave. Is thinking about leaving AMA

## 2019-05-30 DIAGNOSIS — Z7951 Long term (current) use of inhaled steroids: Secondary | ICD-10-CM

## 2019-05-30 DIAGNOSIS — Z79899 Other long term (current) drug therapy: Secondary | ICD-10-CM | POA: Diagnosis not present

## 2019-05-30 DIAGNOSIS — S60416A Abrasion of right little finger, initial encounter: Secondary | ICD-10-CM

## 2019-05-30 DIAGNOSIS — Y92231 Patient bathroom in hospital as the place of occurrence of the external cause: Secondary | ICD-10-CM

## 2019-05-30 DIAGNOSIS — I2781 Cor pulmonale (chronic): Secondary | ICD-10-CM | POA: Diagnosis not present

## 2019-05-30 DIAGNOSIS — W230XXA Caught, crushed, jammed, or pinched between moving objects, initial encounter: Secondary | ICD-10-CM

## 2019-05-30 DIAGNOSIS — N186 End stage renal disease: Secondary | ICD-10-CM | POA: Diagnosis not present

## 2019-05-30 DIAGNOSIS — Y93E1 Activity, personal bathing and showering: Secondary | ICD-10-CM

## 2019-05-30 DIAGNOSIS — I2510 Atherosclerotic heart disease of native coronary artery without angina pectoris: Secondary | ICD-10-CM

## 2019-05-30 DIAGNOSIS — D631 Anemia in chronic kidney disease: Secondary | ICD-10-CM | POA: Diagnosis not present

## 2019-05-30 DIAGNOSIS — I447 Left bundle-branch block, unspecified: Secondary | ICD-10-CM | POA: Diagnosis present

## 2019-05-30 DIAGNOSIS — Z20828 Contact with and (suspected) exposure to other viral communicable diseases: Secondary | ICD-10-CM | POA: Diagnosis not present

## 2019-05-30 DIAGNOSIS — E875 Hyperkalemia: Secondary | ICD-10-CM | POA: Diagnosis not present

## 2019-05-30 DIAGNOSIS — I1311 Hypertensive heart and chronic kidney disease without heart failure, with stage 5 chronic kidney disease, or end stage renal disease: Secondary | ICD-10-CM | POA: Diagnosis present

## 2019-05-30 DIAGNOSIS — B182 Chronic viral hepatitis C: Secondary | ICD-10-CM | POA: Diagnosis present

## 2019-05-30 DIAGNOSIS — Z992 Dependence on renal dialysis: Secondary | ICD-10-CM

## 2019-05-30 DIAGNOSIS — I4891 Unspecified atrial fibrillation: Secondary | ICD-10-CM | POA: Diagnosis present

## 2019-05-30 DIAGNOSIS — Z9119 Patient's noncompliance with other medical treatment and regimen: Secondary | ICD-10-CM | POA: Diagnosis not present

## 2019-05-30 DIAGNOSIS — Z886 Allergy status to analgesic agent status: Secondary | ICD-10-CM | POA: Diagnosis not present

## 2019-05-30 DIAGNOSIS — I050 Rheumatic mitral stenosis: Secondary | ICD-10-CM | POA: Diagnosis present

## 2019-05-30 DIAGNOSIS — Z955 Presence of coronary angioplasty implant and graft: Secondary | ICD-10-CM | POA: Diagnosis not present

## 2019-05-30 DIAGNOSIS — I700 Atherosclerosis of aorta: Secondary | ICD-10-CM | POA: Diagnosis present

## 2019-05-30 DIAGNOSIS — I120 Hypertensive chronic kidney disease with stage 5 chronic kidney disease or end stage renal disease: Secondary | ICD-10-CM | POA: Diagnosis not present

## 2019-05-30 DIAGNOSIS — B181 Chronic viral hepatitis B without delta-agent: Secondary | ICD-10-CM | POA: Diagnosis not present

## 2019-05-30 DIAGNOSIS — F17210 Nicotine dependence, cigarettes, uncomplicated: Secondary | ICD-10-CM | POA: Diagnosis present

## 2019-05-30 DIAGNOSIS — R002 Palpitations: Secondary | ICD-10-CM | POA: Diagnosis not present

## 2019-05-30 DIAGNOSIS — J449 Chronic obstructive pulmonary disease, unspecified: Secondary | ICD-10-CM

## 2019-05-30 DIAGNOSIS — Z9114 Patient's other noncompliance with medication regimen: Secondary | ICD-10-CM | POA: Diagnosis not present

## 2019-05-30 DIAGNOSIS — I4892 Unspecified atrial flutter: Secondary | ICD-10-CM | POA: Diagnosis present

## 2019-05-30 DIAGNOSIS — I482 Chronic atrial fibrillation, unspecified: Secondary | ICD-10-CM | POA: Diagnosis present

## 2019-05-30 DIAGNOSIS — I480 Paroxysmal atrial fibrillation: Secondary | ICD-10-CM | POA: Diagnosis present

## 2019-05-30 DIAGNOSIS — I252 Old myocardial infarction: Secondary | ICD-10-CM | POA: Diagnosis not present

## 2019-05-30 DIAGNOSIS — K7460 Unspecified cirrhosis of liver: Secondary | ICD-10-CM | POA: Diagnosis not present

## 2019-05-30 DIAGNOSIS — K219 Gastro-esophageal reflux disease without esophagitis: Secondary | ICD-10-CM | POA: Diagnosis present

## 2019-05-30 DIAGNOSIS — S60414A Abrasion of right ring finger, initial encounter: Secondary | ICD-10-CM | POA: Diagnosis not present

## 2019-05-30 DIAGNOSIS — N2581 Secondary hyperparathyroidism of renal origin: Secondary | ICD-10-CM | POA: Diagnosis not present

## 2019-05-30 DIAGNOSIS — I959 Hypotension, unspecified: Secondary | ICD-10-CM | POA: Diagnosis present

## 2019-05-30 LAB — CBC
HCT: 26.1 % — ABNORMAL LOW (ref 39.0–52.0)
Hemoglobin: 9.1 g/dL — ABNORMAL LOW (ref 13.0–17.0)
MCH: 27.2 pg (ref 26.0–34.0)
MCHC: 34.9 g/dL (ref 30.0–36.0)
MCV: 77.9 fL — ABNORMAL LOW (ref 80.0–100.0)
Platelets: 134 10*3/uL — ABNORMAL LOW (ref 150–400)
RBC: 3.35 MIL/uL — ABNORMAL LOW (ref 4.22–5.81)
RDW: 15.1 % (ref 11.5–15.5)
WBC: 8.2 10*3/uL (ref 4.0–10.5)
nRBC: 0 % (ref 0.0–0.2)

## 2019-05-30 LAB — RENAL FUNCTION PANEL
Albumin: 3.1 g/dL — ABNORMAL LOW (ref 3.5–5.0)
Anion gap: 13 (ref 5–15)
BUN: 31 mg/dL — ABNORMAL HIGH (ref 6–20)
CO2: 24 mmol/L (ref 22–32)
Calcium: 9.3 mg/dL (ref 8.9–10.3)
Chloride: 101 mmol/L (ref 98–111)
Creatinine, Ser: 6.87 mg/dL — ABNORMAL HIGH (ref 0.61–1.24)
GFR calc Af Amer: 9 mL/min — ABNORMAL LOW (ref >60)
GFR calc non Af Amer: 8 mL/min — ABNORMAL LOW (ref >60)
Glucose, Bld: 134 mg/dL — ABNORMAL HIGH (ref 70–99)
Phosphorus: 3.6 mg/dL (ref 2.5–4.6)
Potassium: 5.4 mmol/L — ABNORMAL HIGH (ref 3.5–5.1)
Sodium: 138 mmol/L (ref 135–145)

## 2019-05-30 IMAGING — US IR PARACENTESIS
1 series · 4 of 4 positions shown · non-contrast
Comparison: none

INDICATION: Ascites

[Series 1: ir (id) (id)/(id)/(id) ir · 4 of 4 slices shown]
[im 1/4]
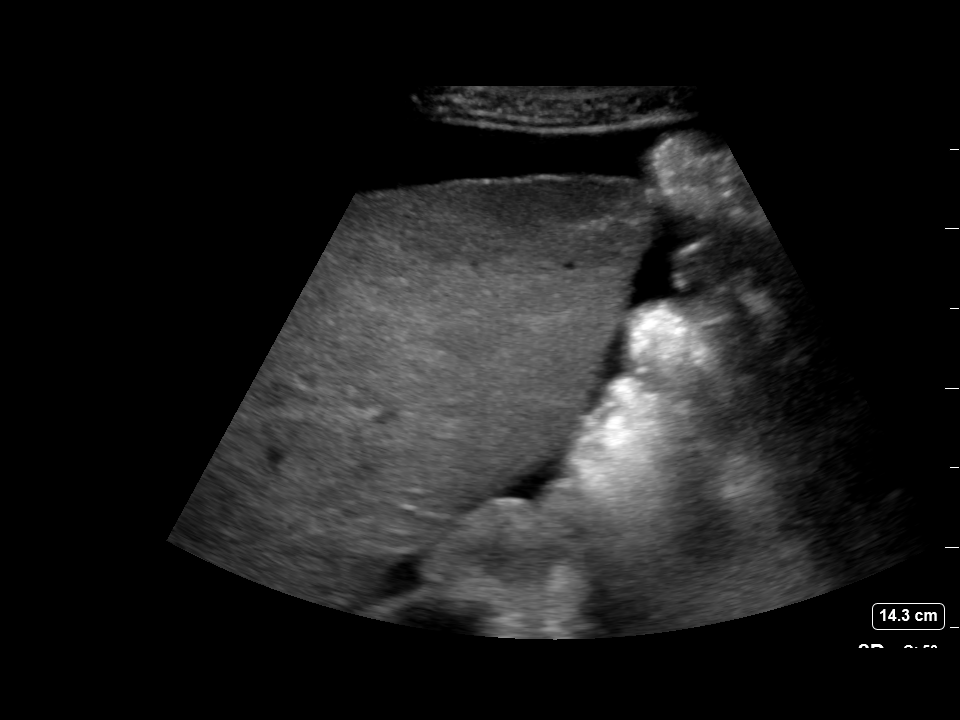
[im 2/4]
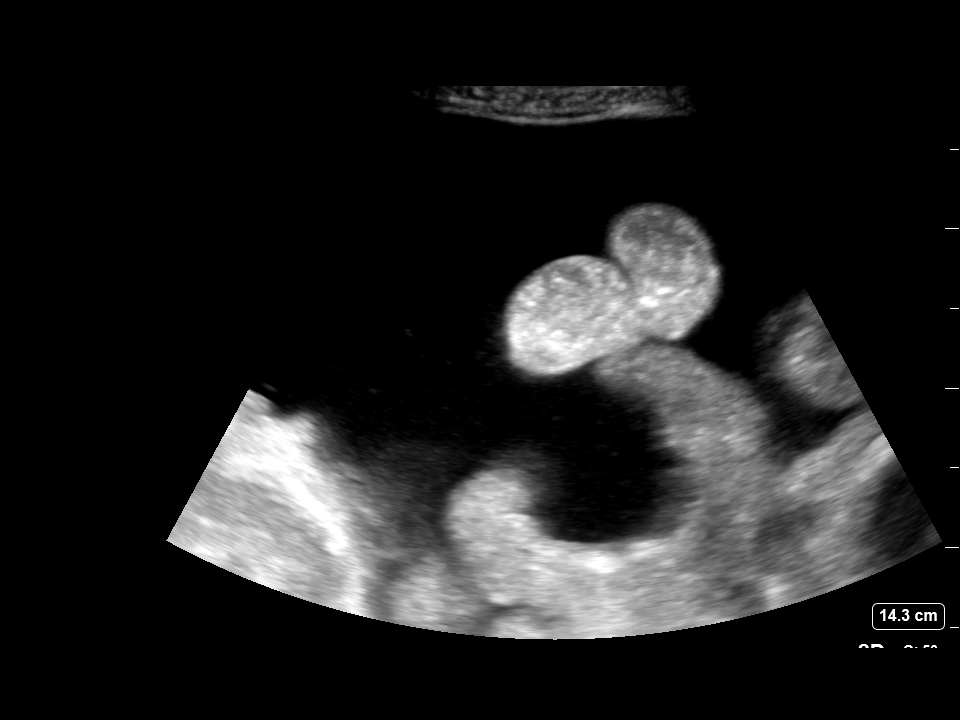
[im 3/4]
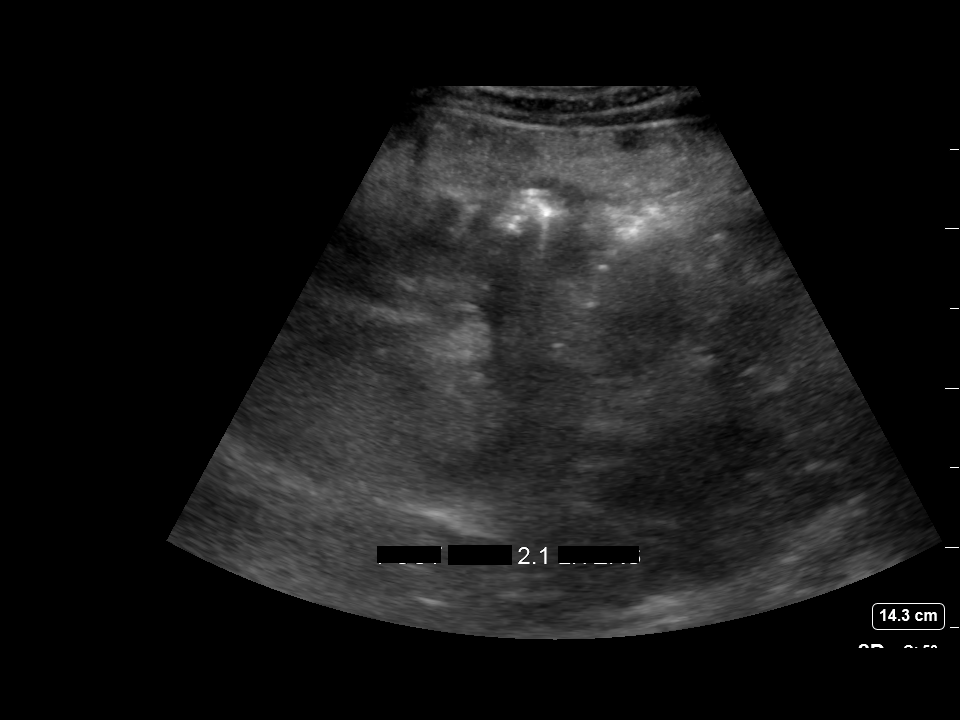
[im 4/4]
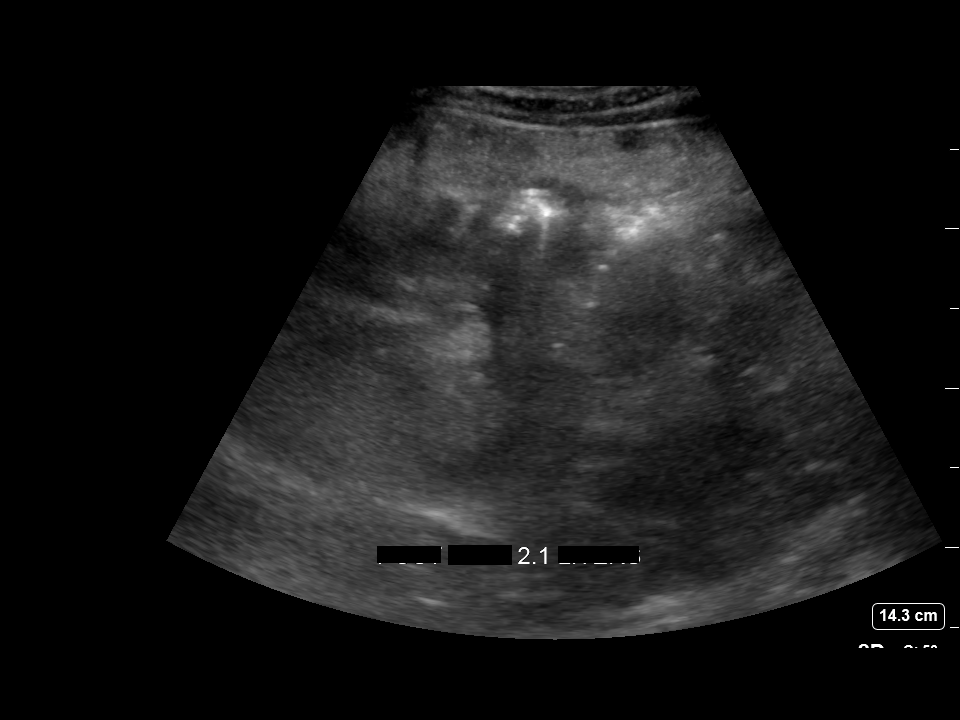

[4 of 4 positions shown; findings below may reference images not displayed]

EXAM:
ULTRASOUND GUIDED  PARACENTESIS

MEDICATIONS:
None.

COMPLICATIONS:
None immediate.

PROCEDURE:
Informed written consent was obtained from the patient after a
discussion of the risks, benefits and alternatives to treatment. A
timeout was performed prior to the initiation of the procedure.

Initial ultrasound scanning demonstrates a large amount of ascites
within the right lower abdominal quadrant. The right lower abdomen
was prepped and draped in the usual sterile fashion. 1% lidocaine
with epinephrine was used for local anesthesia.

Following this, a 6 Fr Safe-T-Centesis catheter was introduced. An
ultrasound image was saved for documentation purposes. The
paracentesis was performed. The catheter was removed and a dressing
was applied. The patient tolerated the procedure well without
immediate post procedural complication.
FINDINGS: A total of approximately 2.1 L of dark clear fluid was removed.
Samples were sent to the laboratory as requested by the clinical
team.
IMPRESSION: Successful ultrasound-guided paracentesis yielding 2.1 liters of
peritoneal fluid.

## 2019-05-30 MED ORDER — METOPROLOL TARTRATE 50 MG PO TABS
50.0000 mg | ORAL_TABLET | Freq: Two times a day (BID) | ORAL | Status: DC
  Administered 2019-05-30 (×2): 50 mg via ORAL
  Filled 2019-05-30 (×2): qty 1

## 2019-05-30 MED ORDER — CALCITRIOL 0.5 MCG PO CAPS: 1.0000 ug | ORAL_CAPSULE | ORAL | Status: DC

## 2019-05-30 MED ORDER — DARBEPOETIN ALFA 25 MCG/0.42ML IJ SOSY
25.0000 ug | PREFILLED_SYRINGE | INTRAMUSCULAR | Status: DC
  Filled 2019-05-30: qty 0.42

## 2019-05-30 MED ORDER — DIPHENHYDRAMINE HCL 25 MG PO CAPS
25.0000 mg | ORAL_CAPSULE | Freq: Once | ORAL | Status: AC
  Administered 2019-05-30: 04:00:00 25 mg via ORAL
  Filled 2019-05-30: qty 1

## 2019-05-30 MED ORDER — HYDROXYZINE HCL 10 MG PO TABS
10.0000 mg | ORAL_TABLET | Freq: Three times a day (TID) | ORAL | Status: DC | PRN
  Administered 2019-05-30 (×2): 10 mg via ORAL
  Filled 2019-05-30 (×2): qty 1

## 2019-05-30 MED ORDER — RAMELTEON 8 MG PO TABS
8.0000 mg | ORAL_TABLET | Freq: Every day | ORAL | Status: DC
  Administered 2019-05-30: 8 mg via ORAL
  Filled 2019-05-30 (×2): qty 1

## 2019-05-30 MED ORDER — ACETAMINOPHEN 500 MG PO TABS
1000.0000 mg | ORAL_TABLET | Freq: Three times a day (TID) | ORAL | Status: DC
  Administered 2019-05-30 – 2019-05-31 (×3): 1000 mg via ORAL
  Filled 2019-05-30 (×3): qty 2

## 2019-05-30 MED ORDER — METOPROLOL TARTRATE 50 MG PO TABS: 50.0000 mg | ORAL_TABLET | Freq: Two times a day (BID) | ORAL | Status: DC

## 2019-05-30 MED ORDER — ACETAMINOPHEN 325 MG PO TABS
650.0000 mg | ORAL_TABLET | Freq: Once | ORAL | Status: AC
  Administered 2019-05-30: 650 mg via ORAL
  Filled 2019-05-30: qty 2

## 2019-05-30 MED ORDER — SEVELAMER CARBONATE 800 MG PO TABS
1600.0000 mg | ORAL_TABLET | Freq: Two times a day (BID) | ORAL | Status: DC
  Administered 2019-05-30 – 2019-05-31 (×2): 1600 mg via ORAL
  Filled 2019-05-30 (×2): qty 2

## 2019-05-30 MED ORDER — ACETAMINOPHEN 325 MG PO TABS
650.0000 mg | ORAL_TABLET | Freq: Four times a day (QID) | ORAL | Status: DC | PRN
  Administered 2019-05-30: 04:00:00 650 mg via ORAL
  Filled 2019-05-30: qty 2

## 2019-05-30 MED ORDER — DILTIAZEM HCL-DEXTROSE 125-5 MG/125ML-% IV SOLN (PREMIX)
5.0000 mg/h | INTRAVENOUS | Status: DC
  Administered 2019-05-30: 14:00:00 5 mg/h via INTRAVENOUS
  Filled 2019-05-30: qty 125

## 2019-05-30 MED ORDER — ACETAMINOPHEN 500 MG PO TABS: 1000.0000 mg | ORAL_TABLET | Freq: Four times a day (QID) | ORAL | Status: DC | PRN

## 2019-05-30 MED ORDER — BACITRACIN-NEOMYCIN-POLYMYXIN 400-5-5000 EX OINT
TOPICAL_OINTMENT | Freq: Two times a day (BID) | CUTANEOUS | Status: DC
  Filled 2019-05-30: qty 1

## 2019-05-30 MED ORDER — BACITRACIN-NEOMYCIN-POLYMYXIN OINTMENT TUBE
TOPICAL_OINTMENT | Freq: Two times a day (BID) | CUTANEOUS | Status: DC
  Administered 2019-05-31 (×2): 1 via TOPICAL
  Filled 2019-05-30: qty 14

## 2019-05-30 NOTE — Progress Notes (Signed)
Pt placed back on cardizem drip. Pt heart rate decreased to 70s and pt heart rhythm converted back to normal sinus.

## 2019-05-30 NOTE — Progress Notes (Signed)
HD was ready to get patient at 1 PM-  He refused to come.  I spoke to him after 3 and he agreed to come but now his spot was taken and will not be able to run until after 6 PM-  Pt told   Louis Meckel

## 2019-05-30 NOTE — Progress Notes (Signed)
Providers to bedside to assess pt. Will await further orders.

## 2019-05-30 NOTE — Plan of Care (Signed)
  Problem: Education: Goal: Knowledge of disease or condition will improve Outcome: Progressing   Problem: Activity: Goal: Ability to tolerate increased activity will improve Outcome: Progressing   

## 2019-05-30 NOTE — Progress Notes (Signed)
Subjective:   Overnight he had his right 4th and 5th fingers accidentally shut in the bathroom door.  Now has small abrasions on those fingers and has them wrapped in Band-Aids.  He received a one-time dose of oral 20mg  dilaudid overnight for pain to his fingers.  Otherwise no acute events overnight.  Still on diltiazem drip.  Vital signs were stable overnight.  Currently he complains of pain to his fingers and generalized itching.  Denies CP, SOB, palpitations, abdominal pain or headaches.  Objective:  Vital signs in last 24 hours: Vitals:   05/29/19 1930 05/29/19 2035 05/30/19 0405 05/30/19 0815  BP: 125/75 135/70 117/69 (!) 147/68  Pulse:   76 78  Resp: 17  18 18   Temp:   99.1 F (37.3 C) 98.2 F (36.8 C)  TempSrc:   Oral Oral  SpO2:   96% 99%  Weight:      Height:       Weight change:   Intake/Output Summary (Last 24 hours) at 05/30/2019 1117 Last data filed at 05/30/2019 1056 Gross per 24 hour  Intake 1066 ml  Output -  Net 1066 ml    General: awake, alert, lying comfortably in bed in NAD Pulm: lungs clear to auscultation bilaterally, normal work of breathing on room air CV: regular rate and rhythm, no murmurs, rubs or gallups GI: abdomen soft, nontender, mildly distended MSK: no peripheral edema Neuro: A&Ox3; no focal deficits Skin: warm and dry; there is a ~1cm abrasion on dorsal aspect of right 4th digit over DIP joint, no associated swelling or erythema. Band-Aid not removed from right 5th finger but no visible swelling or erythema.  Psych: normal mood and affect     Assessment/Plan:  Principal Problem:   Atrial fibrillation with RVR (HCC) Active Problems:   COPD (chronic obstructive pulmonary disease) (HCC)   Hypertension   ESRD on dialysis [facility])   Liver cirrhosis ([place])   58 y.o. male with a PMHx of cirrhosis with recurrent ascites and h/o SBP, ESRD on HD MWF, atrial fibrillation, COPD, cor pulmonale, CAD s/p stent 02/2017 and HTN who presented with  palpitations on 05/29/2019 and was found to be in Afib/flutter with RVR.  Atrial fibrillation/flutter with RVR: Pt currently stable, rate controlled on IV diltiazem.  Unclear cause of RVR, reports adherence with his home metoprolol. Pharmacy checked fill records today and confirmed he has been picking up his metoprolol on schedule.  He is not on anticoagulation.  CHADS-VASC score is 5. However HAS-BLED score is 4. Platelets low currently at 134.  Over the past several months he has often had normal platelets but has also often had thrombocytopenia down to 128.  INR has ranged from 1.1-1.3 over the last few months.  He has poor adherence and often does not show up to appointments, which would make INR monitoring difficult.  Warfarin is unlikely to add considerable benefit in this severely chronically ill patient with ESRD and cirrhosis w/ MELD score of 22.  Patient is also not interested in anticoagulation and states he would not take it even if it were prescribed. - Continue to hold anticoagulation - Stop IV diltiazem this morning and transition back to PO metoprolol later today - Recheck ECG - If ECG looks improved, will discharge home on increased metoprolol dose of 50mg  BID (up from 25mg  BID) to prevent recurrence of RVR  ESRD on HD MWF: Renal labs today overall stable, including K of 5.4 which is within his baseline - If he stays  late in the day today, will need inpatient hemodialysis.  Nephrology made aware  Cirrhosis: Currently no evidence of SBP or acute decompensation - Continue SBP ppx with TMP-SMX - Continue home lactulose  - Order hydroxyzine 10mg  TID, which patient takes at home, for itching  COPD: Currently no SOB, good O2 saturation on room air.   - Dulera 2 puffs BID - Montelukast 10 mg every evening   Hypertension: Metoprolol has been held on initiation of diltiazem, and holding hydralazine due to hypotension yesterday.  BP well-controlled currently off meds - Restart metoprolol  at higher dose of 50mg  BID as above - On discharge will restart hydralazine at lower dose of 50mg  TID (down from 100mg  TID) to prevent hypotension  Right 4th and 5th finger abrasions: No signs of nerve or vascular damage or infection. - Neosporin ointment BID - Acetaminophen for pain  Dispo: Anticipated discharge today   LOS: 0 days   Tonia Ghent, Medical Student 05/30/2019, 11:17 AM

## 2019-05-30 NOTE — Progress Notes (Signed)
Pharmacy student, while rounding with [place] service, observed a 58 year old man who was admitted to the hospital on 17-Nov-20 for Atrial fibrillation with rapid ventricular response. He was rate controlled on IV diltiazem then discharged that afternoon. Later that same evening, the patient returned due to his increased heart rate recurrence and was controlled again on IV diltiazem.  Have evaluated his laboratory values as documented in his history and physical exam  His admission averages: 05/29/19 Blood Pressure: 129 (103-147) / 70 (62-86), and Heart Rate: 86 (74 -97)  The patient was on metoprolol tartrate 25 mg BID for rate control due to Atrial fibrillation. Due to the patient's recent ED visits for an increased heart rate, I recommend increasing metoprolol tartrate 25 mg BID to metoprolol tartrate 50 mg BID for better rate control. Plan to educate the patient to report/document any side effects from dose increase.     Marlowe Aschoff, PharmD Student 4th year

## 2019-05-30 NOTE — Progress Notes (Addendum)
Pt with an elevated hr running in the 150S. RN went in to assess pt at bedside. VS taken with a b/p-126/82, hr-157, o2 sat-93% on RA. Pt complaining of SOB, O2 PLACED ON [place] at 2L. pAGED PROVIDER ON CALL X3.Awaiting further orders and a call back.

## 2019-05-30 NOTE — Discharge Instructions (Signed)
You were seen in the hospital for an elevated heart rate.  You were treated with IV medicines which brought down your heart rate.  We have changed one of your medicines to a higher dose to lower your heart rate outside of the hospital.  The only possible side effect of this could be to also lower your blood pressure.  You underwent dialysis here and your blood pressure tolerated this new medicine dose.  Please follow-up with your primary care doctor within the week.  You can return to the hospital if he have increased heart rate.

## 2019-05-30 NOTE — Progress Notes (Signed)
Per provider Madilyn Fireman, pt to get po metoprolol and nurse to reassess pt in 30 mins. Will continue to monitor pt.

## 2019-05-30 NOTE — Progress Notes (Signed)
Renal is aware that [name] is in the hospital.  Will plan to do his routine HD later today- orders are in.  As he is observation status will not do a formal consult   Louis Meckel

## 2019-05-31 DIAGNOSIS — Z888 Allergy status to other drugs, medicaments and biological substances status: Secondary | ICD-10-CM

## 2019-05-31 DIAGNOSIS — Z886 Allergy status to analgesic agent status: Secondary | ICD-10-CM

## 2019-05-31 DIAGNOSIS — Z885 Allergy status to narcotic agent status: Secondary | ICD-10-CM

## 2019-05-31 LAB — BASIC METABOLIC PANEL
Anion gap: 9 (ref 5–15)
BUN: 18 mg/dL (ref 6–20)
CO2: 28 mmol/L (ref 22–32)
Calcium: 8.8 mg/dL — ABNORMAL LOW (ref 8.9–10.3)
Chloride: 99 mmol/L (ref 98–111)
Creatinine, Ser: 5.08 mg/dL — ABNORMAL HIGH (ref 0.61–1.24)
GFR calc Af Amer: 14 mL/min — ABNORMAL LOW (ref >60)
GFR calc non Af Amer: 12 mL/min — ABNORMAL LOW (ref >60)
Glucose, Bld: 112 mg/dL — ABNORMAL HIGH (ref 70–99)
Potassium: 4.4 mmol/L (ref 3.5–5.1)
Sodium: 136 mmol/L (ref 135–145)

## 2019-05-31 MED ORDER — METOPROLOL TARTRATE 50 MG PO TABS
50.0000 mg | ORAL_TABLET | Freq: Two times a day (BID) | ORAL | Status: DC
  Administered 2019-05-31: 07:00:00 50 mg via ORAL
  Filled 2019-05-31: qty 1

## 2019-05-31 MED ORDER — METOPROLOL TARTRATE 50 MG PO TABS: 50.0000 mg | ORAL_TABLET | Freq: Two times a day (BID) | ORAL | 0 refills | Status: DC

## 2019-05-31 MED ORDER — HYDRALAZINE HCL 100 MG PO TABS: 50.0000 mg | ORAL_TABLET | Freq: Three times a day (TID) | ORAL | 0 refills | Status: DC

## 2019-05-31 NOTE — Progress Notes (Addendum)
Awoken from sleep , no cos,tolerted HD last evening as he refused hd in afternoon.  k 4.4  He is at his edw  With no HD Changes /Noted for dc home today with  Med changes  With his Afib  RVR  event  . Metoprololat higher dose of 50mg  BID as above - Hydralazineat lower dose of 50mg  TID   Ernest Haber, PA-C [place] [phone] 05/31/2019,11:02 AM  LOS: 1 day   Patient seen and examined, agree with above note with above modifications. Going home today.  Should present to OP HD unit tomorrow  Corliss Parish, MD 05/31/2019

## 2019-05-31 NOTE — Progress Notes (Signed)
   Subjective:  No acute events overnight.  Vital signs stable.  Had his HD yesterday late in the afternoon.  He is feeling fairly well this morning and denies CP or SOB.  Wants to go home to see his cats.  Objective:  Vital signs in last 24 hours: Vitals:   05/30/19 2238 05/31/19 0526 05/31/19 0813 05/31/19 0821  BP: (!) 144/58 (!) 155/73    Pulse: 72 62 66   Resp: 20 20 18    Temp: 97.8 F (36.6 C) 98.7 F (37.1 C) 98.6 F (37 C)   TempSrc: Oral Oral Oral   SpO2: 100% 97% 99% 96%  Weight:  65.2 kg    Height:       Weight change: 0.7 kg  Intake/Output Summary (Last 24 hours) at 05/31/2019 1035 Last data filed at 05/31/2019 0500 Gross per 24 hour  Intake 600.67 ml  Output 1538 ml  Net -937.33 ml    General: awake, alert, lying comfortably in bed in NAD Pulm: normal work of breathing on room air CV: regular rate and rhythm, no murmurs, rubs or gallups GI: abdomen soft, nontender, mildly distended MSK: no peripheral edema Neuro: A&Ox3; no focal deficits Skin: warm and dry Psych: normal mood and affect     Assessment/Plan:  Principal Problem:   Atrial fibrillation with RVR (HCC) Active Problems:   COPD (chronic obstructive pulmonary disease) (HCC)   Hypertension   ESRD on dialysis ([place])   Liver cirrhosis (HCC)   A-fib ([place])   58 y.o. male with a PMHx of cirrhosis with recurrent ascites and h/o SBP, ESRD on HD MWF, atrial fibrillation, COPD, cor pulmonale, CAD s/p stent 02/2017 and HTN who presented with palpitations on 05/29/2019 and was found to be in Afib/flutter with RVR.  Atrial fibrillation/flutter with RVR: Initially rate-controlled on IV diltiazem yesterday but then reverted to RVR after transitioning off the diltiazem, before metoprolol was started.  His IV diltiazem was restarted yesterday afternoon and he returned to normal HR with normal sinus rhythm.  HR remained stable overnight.  We gave him his initial higher metoprolol dose of 50mg  last night, then  again this morning, and then stopped the diltiazem.  HR has remained in the 60s-70s off of the diltiazem and he is now stable to discharge home. - Discharge on metoprolol 50mg  BID (up from 25mg  BID prior to arrival) - Continue to hold Warfarin due to bleeding risk, poor adherence, patient disinterest, and likely minimal long-term benefit given his overall health  ESRD on HD MWF: Renal labs have been stable, including K of 4.4 this morning.  Had HD last night. - ESRD management per Nephrology; no changes made by our service  Cirrhosis: No evidence of SBP or acute decompensation - Continue SBP ppx with TMP-SMX - Continue home lactulose   COPD: Currently no SOB, good O2 saturation on room air.   - Dulera 2 puffs BID -Montelukast 10 mg every evening  Hypertension:  Discharging home on: - Metoprolol at higher dose of 50mg  BID as above - Hydralazine at lower dose of 50mg  TID (down from 100mg  TID) to prevent hypotension   Dispo: Anticipated discharge today   LOS: 1 day   Tonia Ghent, Medical Student 05/31/2019, 10:35 AM

## 2019-05-31 NOTE — Progress Notes (Signed)
Pt given discharge instructions, all questions answered. All vitals stable. All belongings returned including glasses, cellphone/charger, car keys, and clothing IV removed, pt discharged in wheelchair to personal vehicle.

## 2019-05-31 NOTE — Discharge Summary (Addendum)
Name: [name] MRN: [id number] DOB: 03-16-1961 58 y.o. PCP: [name], FNP  Date of Admission: 05/29/2019  4:03 PM Date of Discharge: 05/31/2019 Attending Physician: No att. providers found  Discharge Diagnosis: 1. Atrial fibrillation/flutter with RVR  Discharge Medications: Allergies as of 05/31/2019      Reactions   Morphine And Related Other (See Comments)   Stomach pain   Aspirin Other (See Comments)   STOMACH PAIN   Clonidine Derivatives Itching   Tramadol Itching   Tylenol [acetaminophen] Nausea Only   Stomach ache      Medication List    TAKE these medications   hydrALAZINE 100 MG tablet Commonly known as: APRESOLINE Take 0.5 tablets (50 mg total) by mouth 3 (three) times daily. What changed: how much to take   lactulose 10 GM/15ML solution Commonly known as: CHRONULAC TAKE 45 MLS BY MOUTH 2 TIMES DAILY.   metoprolol tartrate 50 MG tablet Commonly known as: LOPRESSOR Take 1 tablet (50 mg total) by mouth 2 (two) times daily. What changed:   medication strength  how much to take   montelukast 10 MG tablet Commonly known as: SINGULAIR Take 10 mg by mouth every evening.   ondansetron 4 MG disintegrating tablet Commonly known as: Zofran ODT Take 1 tablet (4 mg total) by mouth every 8 (eight) hours as needed for nausea or vomiting.   pantoprazole 40 MG tablet Commonly known as: PROTONIX Take 1 tablet (40 mg total) by mouth daily.   sevelamer carbonate 800 MG tablet Commonly known as: RENVELA Take 4 tablets with meals  And 2 tablets twice daily with snacks What changed:   how much to take  how to take this  when to take this  additional instructions   sulfamethoxazole-trimethoprim 800-160 MG tablet Commonly known as: BACTRIM DS Take 1 tablet by mouth daily. What changed: when to take this   Symbicort 160-4.5 MCG/ACT inhaler Generic drug: budesonide-formoterol Inhale 2 puffs into the lungs 2 (two) times daily.     Ventolin HFA 108 (90 Base) MCG/ACT inhaler Generic drug: albuterol Inhale 2 puffs into the lungs every 6 (six) hours as needed for wheezing or shortness of breath.   zolpidem 10 MG tablet Commonly known as: AMBIEN Take 10 mg by mouth at bedtime as needed for sleep.       Disposition and follow-up:   [name] was discharged from [facility] in Stable condition.  At the hospital follow up visit please address:  1.  #Atrial fibrillation/flutter with RVR  Has prior h/o Afib and was already on metoprolol 25mg  BID.  Unclear what triggered refractory RVR.  We were able to achieve rate control with IV diltiazem and then transitioned to po metoprolol 50mg  BID.       #Hypertension  Because he was discharged home on higher metoprolol dose of 50mg  BID, he was discharged on lower hydralazine dose of 50mg  TID (down from 100mg  TID) to prevent hypotension.  2.  Labs / imaging needed at time of follow-up: none  3.  Pending labs/ test needing follow-up: none  Follow-up Appointments: Follow-up Information    [name], FNP Follow up.   Specialty: Family Medicine Contact information: [place] [id number] [place] [facility] Course by problem list: [name] is a 58 year old male with past medical history of decompensated cirrhosis with recurrent ascites and history of SBP, ESRD on HD,  paroxysmal atrial fibrillation, COPD, HTN, and CAD status post stent 02/2017 who presented on 05/29/2019 with tachycardia.  1. Atrial fibrillation/flutter with RVR: EKG on admission was consistent with RVR with rates above 150.  He was treated with IV diltiazem and placed on diltiazem drip which brought his rate under control.  On 11/18 his diltiazem was discontinued with plan to transition to PO metoprolol later that day.  However he reverted to RVR after transitioning off the diltiazem, before metoprolol was started.  His IV diltiazem  was restarted and he returned to normal HR with normal sinus rhythm, and he was restarted on metoprolol later that night while still on the diltiazem.  Metoprolol was restarted at higher dose of 50mg  BID (up from 25mg  BID prior to arrival).  On 11/19 his diltiazem was discontinued.  He continued to be rate-controlled on metoprolol so he was discharged home.  2. Hypertension: Discharged home on metoprololat higher dose of 50mg  BID as above, and hydralazineat lower dose of 50mg  TID (down from 100mg  TID) to prevent hypotension.  3. ESRD on HD TRR:NHAFB labs were stable during hospital stay.  Had his routine Wednesday HD on the night of 11/18.  4. Cirrhosis: Had no evidence of SBP or acute decompensation.  He was continued on SBP prophylaxis with TMP-SMX and continued home lactulose.  5. COPD:He had no complications of COPD while hospitalized.  Discharged home on home regimen of albuterol 2 puff q6hrs prn, Singulair 10mg  qhs, and Symbicort 2 puffs BID.    Discharge Vitals:   BP (!) 155/73 (BP Location: Left Arm)    Pulse 66    Temp 98.6 F (37 C) (Oral)    Resp 18    Ht 5\' 10"  (1.778 m)    Wt 65.2 kg    SpO2 96%    BMI 20.62 kg/m   Pertinent Labs, Studies, and Procedures:  BMP Latest Ref Rng & Units 06/01/2019 05/31/2019 05/30/2019  Glucose 70 - 99 mg/dL 164(H) 112(H) 134(H)  BUN 6 - 20 mg/dL 27(H) 18 31(H)  Creatinine 0.61 - 1.24 mg/dL 6.72(H) 5.08(H) 6.87(H)  Sodium 135 - 145 mmol/L 136 136 138  Potassium 3.5 - 5.1 mmol/L 4.5 4.4 5.4(H)  Chloride 98 - 111 mmol/L 98 99 101  CO2 22 - 32 mmol/L 22 28 24   Calcium 8.9 - 10.3 mg/dL 9.5 8.8(L) 9.3     Discharge Instructions: Discharge Instructions    Diet - low sodium heart healthy   Complete by: As directed    Increase activity slowly   Complete by: As directed       Signed: Tonia Ghent, Medical Student 06/01/2019, 2:06 PM   Pager: [phone]

## 2019-06-01 ENCOUNTER — Encounter (HOSPITAL_COMMUNITY): Payer: Self-pay

## 2019-06-01 ENCOUNTER — Emergency Department (HOSPITAL_COMMUNITY)
Admission: EM | Admit: 2019-06-01 | Discharge: 2019-06-01 | Disposition: A | Discharge status: Home / Self Care | Payer: Medicare Other | Source: Home / Self Care | Attending: Emergency Medicine | Admitting: Emergency Medicine

## 2019-06-01 ENCOUNTER — Emergency Department (HOSPITAL_COMMUNITY): Payer: Medicare Other

## 2019-06-01 DIAGNOSIS — J449 Chronic obstructive pulmonary disease, unspecified: Secondary | ICD-10-CM | POA: Insufficient documentation

## 2019-06-01 DIAGNOSIS — I120 Hypertensive chronic kidney disease with stage 5 chronic kidney disease or end stage renal disease: Secondary | ICD-10-CM | POA: Insufficient documentation

## 2019-06-01 DIAGNOSIS — D631 Anemia in chronic kidney disease: Secondary | ICD-10-CM | POA: Diagnosis not present

## 2019-06-01 DIAGNOSIS — F17210 Nicotine dependence, cigarettes, uncomplicated: Secondary | ICD-10-CM | POA: Insufficient documentation

## 2019-06-01 DIAGNOSIS — R072 Precordial pain: Secondary | ICD-10-CM | POA: Diagnosis not present

## 2019-06-01 DIAGNOSIS — R188 Other ascites: Secondary | ICD-10-CM | POA: Diagnosis not present

## 2019-06-01 DIAGNOSIS — K7030 Alcoholic cirrhosis of liver without ascites: Secondary | ICD-10-CM | POA: Diagnosis not present

## 2019-06-01 DIAGNOSIS — D509 Iron deficiency anemia, unspecified: Secondary | ICD-10-CM | POA: Diagnosis not present

## 2019-06-01 DIAGNOSIS — I4819 Other persistent atrial fibrillation: Secondary | ICD-10-CM | POA: Diagnosis not present

## 2019-06-01 DIAGNOSIS — R079 Chest pain, unspecified: Secondary | ICD-10-CM | POA: Diagnosis not present

## 2019-06-01 DIAGNOSIS — R0602 Shortness of breath: Secondary | ICD-10-CM | POA: Diagnosis not present

## 2019-06-01 DIAGNOSIS — N2581 Secondary hyperparathyroidism of renal origin: Secondary | ICD-10-CM | POA: Diagnosis not present

## 2019-06-01 DIAGNOSIS — N186 End stage renal disease: Secondary | ICD-10-CM | POA: Diagnosis not present

## 2019-06-01 DIAGNOSIS — I1311 Hypertensive heart and chronic kidney disease without heart failure, with stage 5 chronic kidney disease, or end stage renal disease: Secondary | ICD-10-CM | POA: Diagnosis not present

## 2019-06-01 DIAGNOSIS — I2510 Atherosclerotic heart disease of native coronary artery without angina pectoris: Secondary | ICD-10-CM | POA: Insufficient documentation

## 2019-06-01 DIAGNOSIS — Z20828 Contact with and (suspected) exposure to other viral communicable diseases: Secondary | ICD-10-CM | POA: Diagnosis not present

## 2019-06-01 DIAGNOSIS — Z79899 Other long term (current) drug therapy: Secondary | ICD-10-CM | POA: Insufficient documentation

## 2019-06-01 DIAGNOSIS — I4891 Unspecified atrial fibrillation: Secondary | ICD-10-CM

## 2019-06-01 DIAGNOSIS — Z992 Dependence on renal dialysis: Secondary | ICD-10-CM | POA: Diagnosis not present

## 2019-06-01 LAB — BASIC METABOLIC PANEL
Anion gap: 16 — ABNORMAL HIGH (ref 5–15)
BUN: 27 mg/dL — ABNORMAL HIGH (ref 6–20)
CO2: 22 mmol/L (ref 22–32)
Calcium: 9.5 mg/dL (ref 8.9–10.3)
Chloride: 98 mmol/L (ref 98–111)
Creatinine, Ser: 6.72 mg/dL — ABNORMAL HIGH (ref 0.61–1.24)
GFR calc Af Amer: 10 mL/min — ABNORMAL LOW (ref >60)
GFR calc non Af Amer: 8 mL/min — ABNORMAL LOW (ref >60)
Glucose, Bld: 164 mg/dL — ABNORMAL HIGH (ref 70–99)
Potassium: 4.5 mmol/L (ref 3.5–5.1)
Sodium: 136 mmol/L (ref 135–145)

## 2019-06-01 LAB — TROPONIN I (HIGH SENSITIVITY)
Troponin I (High Sensitivity): 83 ng/L — ABNORMAL HIGH (ref <18)
Troponin I (High Sensitivity): 91 ng/L — ABNORMAL HIGH (ref <18)

## 2019-06-01 LAB — CBC
HCT: 28.3 % — ABNORMAL LOW (ref 39.0–52.0)
Hemoglobin: 9.9 g/dL — ABNORMAL LOW (ref 13.0–17.0)
MCH: 27 pg (ref 26.0–34.0)
MCHC: 35 g/dL (ref 30.0–36.0)
MCV: 77.3 fL — ABNORMAL LOW (ref 80.0–100.0)
Platelets: 160 10*3/uL (ref 150–400)
RBC: 3.66 MIL/uL — ABNORMAL LOW (ref 4.22–5.81)
RDW: 15 % (ref 11.5–15.5)
WBC: 7.8 10*3/uL (ref 4.0–10.5)
nRBC: 0 % (ref 0.0–0.2)

## 2019-06-01 MED ORDER — METOPROLOL TARTRATE 25 MG PO TABS
50.0000 mg | ORAL_TABLET | Freq: Once | ORAL | Status: AC
  Administered 2019-06-01: 50 mg via ORAL
  Filled 2019-06-01: qty 2

## 2019-06-01 NOTE — Discharge Planning (Signed)
EDCM contacted Garth Bigness, LCSW Renal Navigator to ensure pt could keep his dialysis appointment today.  EDCM will provide transportation to HD clinic and Renal Navigator will arrange transportation home.

## 2019-06-01 NOTE — ED Provider Notes (Signed)
  Provider Note MRN:  [id number]  Arrival date & time: 06/01/19    ED Course and Medical Decision Making  Assumed care from Dr. Dayna Barker at shift change.  Patient had no return of RVR during the remainder of his ED stay.  Second troponin minimally increased from prior but similar to his level upon discharge yesterday.  He is requesting discharge.  He wants to go to dialysis this morning.  He agrees to follow-up in the A. fib clinic.  Procedures  Final Clinical Impressions(s) / ED Diagnoses     ICD-10-CM   1. Atrial fibrillation with rapid ventricular response [facility])  I48.91     ED Discharge Orders         Ordered    Amb Referral to AFIB Clinic     06/01/19 0916            Discharge Instructions     You were evaluated in the Emergency Department and after careful evaluation, we did not find any emergent condition requiring admission or further testing in the hospital.  Your exam/testing today is overall reassuring.  Please go to your dialysis appointment today and follow-up with the cardiologist.  Please return to the Emergency Department if you experience any worsening of your condition.  We encourage you to follow up with a primary care provider.  Thank you for allowing [place] to be a part of your care.    Barth Kirks. Sedonia Small, [place] [email or web address]    Maudie Flakes, MD 06/01/19 [phone]

## 2019-06-01 NOTE — ED Notes (Signed)
ED Provider at bedside. 

## 2019-06-01 NOTE — Discharge Instructions (Addendum)
You were evaluated in the Emergency Department and after careful evaluation, we did not find any emergent condition requiring admission or further testing in the hospital.  Your exam/testing today is overall reassuring.  Please go to your dialysis appointment today and follow-up with the cardiologist.  Please return to the Emergency Department if you experience any worsening of your condition.  We encourage you to follow up with a primary care provider.  Thank you for allowing [place] to be a part of your care.

## 2019-06-01 NOTE — ED Provider Notes (Signed)
Emergency Department Provider Note   I have reviewed the triage vital signs and the nursing notes.   HISTORY  Chief Complaint Tachycardia   HPI [name] is a 58 y.o. male who presents emergency department today with palpitations and dyspnea.  Similar to previous evaluations for atrial fibrillation with rapid ventricular response.  EMS thought it was V. tach secondary to him having a wide QRS so gave amiodarone which resolved the symptoms.  At this time patient symptom-free this morning was contacted.  He states compliance with his medications however he just left the hospital yesterday.  At this time has no chest pain or other associated symptoms.   No other associated or modifying symptoms.    Past Medical History:  Diagnosis Date  . Anemia   . Anxiety   . Arthritis    left shoulder  . Atherosclerosis of aorta ([place])   . Cardiomegaly   . Chest pain    DATE UNKNOWN, C/O PERIODICALLY  . Cocaine abuse ([place])   . COPD exacerbation ([place]) 08/17/2016  . Coronary artery disease    stent 02/22/17  . ESRD (end stage renal disease) on dialysis ([place])    "[name]; MWF" (07/04/2017)  . GERD (gastroesophageal reflux disease)    DATE UNKNOWN  . Hemorrhoids   . Hepatitis B, chronic ([place])   . Hepatitis C   . History of kidney stones   . Hyperkalemia   . Hypertension   . Kidney failure   . Metabolic bone disease    Patient denies  . Mitral stenosis   . Myocardial infarction ([place])   . Pneumonia   . Pulmonary edema   . Solitary rectal ulcer syndrome 07/2017   at [facility] for rectal bleeding  . Tubular adenoma of colon     Patient Active Problem List   Diagnosis Date Noted  . A-fib ([place]) 05/30/2019  . Atrial fibrillation with RVR ([place]) 05/29/2019  . Melena   . Pressure injury of skin 03/09/2019  . Abdominal distention   . Volume overload 12/28/2018  . Sepsis ([place]) 09/12/2018  . Atherosclerosis of native coronary artery of native heart without angina pectoris  03/11/2018  . Benign neoplasm of cecum   . Benign neoplasm of ascending colon   . Benign neoplasm of descending colon   . Benign neoplasm of rectum   . Paroxysmal atrial fibrillation ([place]) 01/23/2018  . Hx of colonic polyps 01/20/2018  . ESRD (end stage renal disease) ([place]) 11/21/2017  . GERD (gastroesophageal reflux disease) 11/16/2017  . Liver cirrhosis ([place]) 11/15/2017  . DNR (do not resuscitate)   . Palliative care by specialist   . Hyponatremia 11/04/2017  . SBP (spontaneous bacterial peritonitis) ([place]) 10/30/2017  . Liver disease, chronic 10/30/2017  . SOB (shortness of breath)   . Abdominal pain 10/28/2017  . Upper airway cough syndrome with flattening on f/v loop 10/13/17 c/w vcd 10/17/2017  . Elevated diaphragm 10/13/2017  . Ileus ([place]) 09/29/2017  . Malnutrition of moderate degree 09/29/2017  . Sinus congestion 09/03/2017  . Symptomatic anemia 09/02/2017  . Other cirrhosis of liver ([place]) 09/02/2017  . Left bundle branch block 09/02/2017  . Mitral stenosis 09/02/2017  . Hematochezia 07/15/2017  . Wide-complex tachycardia ([place])   . Endotracheally intubated   . ESRD on dialysis ([place]) 07/04/2017  . Acute respiratory failure with hypoxia ([place]) 06/18/2017  . CKD (chronic kidney disease) stage V requiring chronic dialysis ([place]) 06/18/2017  . History of Cocaine abuse ([place]) 06/18/2017  . Hypertension  06/18/2017  . Infection of AV graft for dialysis ([place]) 06/18/2017  . Anxiety 06/18/2017  . Anemia due to chronic kidney disease 06/18/2017  . Atrial flutter with rapid ventricular response ([place]) 06/18/2017  . Personality disorder ([place]) 06/13/2017  . Cellulitis 06/12/2017  . Adjustment disorder with mixed anxiety and depressed mood 06/10/2017  . Suicidal ideation 06/10/2017  . Arm wound, left, sequela 06/10/2017  . Dyspnea on exertion 05/29/2017  . Tachycardia 05/29/2017  . Hyperkalemia 05/22/2017  . Acute metabolic encephalopathy   . Anemia 04/23/2017  . Ascites 04/23/2017   . COPD (chronic obstructive pulmonary disease) ([place]) 04/23/2017  . Acute on chronic respiratory failure with hypoxia ([place]) 03/25/2017  . Arrhythmia 03/25/2017  . COPD GOLD 0 with flattening on inps f/v  09/27/2016  . Essential hypertension 09/27/2016  . Fluid overload 08/30/2016  . COPD exacerbation ([place]) 08/17/2016  . Hypertensive urgency 08/17/2016  . Respiratory failure ([place]) 08/17/2016  . Problem with dialysis access ([place]) 07/23/2016  . Chronic hepatitis B ([place]) 03/05/2014  . Chronic hepatitis C without hepatic coma ([place]) 03/05/2014  . Internal hemorrhoids with bleeding, swelling and itching 03/05/2014  . Thrombocytopenia ([place]) 03/05/2014  . Chest pain 02/27/2014  . Alcohol abuse 04/14/2009  . Cigarette smoker 04/14/2009  . GANGLION CYST 04/14/2009    Past Surgical History:  Procedure Laterality Date  . A/V FISTULAGRAM Left 05/26/2017   Procedure: A/V FISTULAGRAM;  Surgeon: Conrad Fredonia, MD;  Location: [facility];  Service: Cardiovascular;  Laterality: Left;  . A/V FISTULAGRAM Right 11/18/2017   Procedure: A/V FISTULAGRAM - Right Arm;  Surgeon: Elam Dutch, MD;  Location: [facility];  Service: Cardiovascular;  Laterality: Right;  . APPLICATION OF WOUND VAC Left 06/14/2017   Procedure: APPLICATION OF WOUND VAC;  Surgeon: Katha Cabal, MD;  Location: [facility];  Service: Vascular;  Laterality: Left;  . AV FISTULA PLACEMENT  2012   BELIEVED WAS PLACED IN JUNE  . AV FISTULA PLACEMENT Right 08/09/2017   Procedure: Creation Right arm ARTERIOVENOUS BRACHIOCEPOHALIC FISTULA;  Surgeon: Elam Dutch, MD;  Location: [facility];  Service: Vascular;  Laterality: Right;  . AV FISTULA PLACEMENT Right 11/22/2017   Procedure: INSERTION OF ARTERIOVENOUS (AV) GORE-TEX GRAFT RIGHT UPPER ARM;  Surgeon: Elam Dutch, MD;  Location: [facility];  Service: Vascular;  Laterality: Right;  . BIOPSY  01/25/2018   Procedure: BIOPSY;  Surgeon: Jerene Bears, MD;  Location: [facility];  Service: Gastroenterology;;  . BIOPSY  04/10/2019   Procedure: BIOPSY;  Surgeon: Jerene Bears, MD;  Location: [facility];  Service: Gastroenterology;;  . COLONOSCOPY    . COLONOSCOPY WITH PROPOFOL N/A 01/25/2018   Procedure: COLONOSCOPY WITH PROPOFOL;  Surgeon: Jerene Bears, MD;  Location: [facility];  Service: Gastroenterology;  Laterality: N/A;  . CORONARY STENT INTERVENTION N/A 02/22/2017   Procedure: CORONARY STENT INTERVENTION;  Surgeon: Nigel Mormon, MD;  Location: [place] [facility];  Service: Cardiovascular;  Laterality: N/A;  . ESOPHAGOGASTRODUODENOSCOPY (EGD) WITH PROPOFOL N/A 01/25/2018   Procedure: ESOPHAGOGASTRODUODENOSCOPY (EGD) WITH PROPOFOL;  Surgeon: Jerene Bears, MD;  Location: [place];  Service: Gastroenterology;  Laterality: N/A;  . ESOPHAGOGASTRODUODENOSCOPY (EGD) WITH PROPOFOL N/A 04/10/2019   Procedure: ESOPHAGOGASTRODUODENOSCOPY (EGD) WITH PROPOFOL;  Surgeon: Jerene Bears, MD;  [facility];  Service: Gastroenterology;  Laterality: N/A;  . FLEXIBLE SIGMOIDOSCOPY N/A 07/15/2017   Procedure: FLEXIBLE SIGMOIDOSCOPY;  Surgeon: Carol Ada, MD;  Location: [facility];  Service: Endoscopy;  Laterality: N/A;  .  HEMORRHOID BANDING    . I&D EXTREMITY Left 06/01/2017   Procedure: IRRIGATION AND DEBRIDEMENT LEFT ARM HEMATOMA WITH LIGATION OF LEFT ARM AV FISTULA;  Surgeon: Elam Dutch, MD;  Location: [facility];  Service: Vascular;  Laterality: Left;  . I&D EXTREMITY Left 06/14/2017   Procedure: IRRIGATION AND DEBRIDEMENT EXTREMITY;  Surgeon: Katha Cabal, MD;  Location: [facility];  Service: Vascular;  Laterality: Left;  . INSERTION OF DIALYSIS CATHETER  05/30/2017  . INSERTION OF DIALYSIS CATHETER N/A 05/30/2017   Procedure: INSERTION OF DIALYSIS CATHETER;  Surgeon: Elam Dutch, MD;  Location: [facility];  Service: Vascular;  Laterality: N/A;  . IR PARACENTESIS  08/30/2017  . IR PARACENTESIS  09/29/2017  . IR PARACENTESIS  10/28/2017  .  IR PARACENTESIS  11/09/2017  . IR PARACENTESIS  11/16/2017  . IR PARACENTESIS  11/28/2017  . IR PARACENTESIS  12/01/2017  . IR PARACENTESIS  12/06/2017  . IR PARACENTESIS  01/03/2018  . IR PARACENTESIS  01/23/2018  . IR PARACENTESIS  02/07/2018  . IR PARACENTESIS  02/21/2018  . IR PARACENTESIS  03/06/2018  . IR PARACENTESIS  03/17/2018  . IR PARACENTESIS  04/04/2018  . IR PARACENTESIS  12/28/2018  . IR PARACENTESIS  01/08/2019  . IR PARACENTESIS  01/23/2019  . IR PARACENTESIS  02/01/2019  . IR PARACENTESIS  02/19/2019  . IR PARACENTESIS  03/01/2019  . IR PARACENTESIS  03/15/2019  . IR PARACENTESIS  04/03/2019  . IR PARACENTESIS  04/12/2019  . IR PARACENTESIS  05/01/2019  . IR PARACENTESIS  05/08/2019  . IR PARACENTESIS  05/24/2019  . IR RADIOLOGIST EVAL & MGMT  02/14/2018  . IR RADIOLOGIST EVAL & MGMT  02/22/2019  . LEFT HEART CATH AND CORONARY ANGIOGRAPHY N/A 02/22/2017   Procedure: LEFT HEART CATH AND CORONARY ANGIOGRAPHY;  Surgeon: Nigel Mormon, MD;  Location: [facility];  Service: Cardiovascular;  Laterality: N/A;  . LIGATION OF ARTERIOVENOUS  FISTULA Left 10/17/5460   Procedure: Plication of Left Arm Arteriovenous Fistula;  Surgeon: Elam Dutch, MD;  Location: [facility];  Service: Vascular;  Laterality: Left;  . POLYPECTOMY    . POLYPECTOMY  01/25/2018   Procedure: POLYPECTOMY;  Surgeon: Jerene Bears, MD;  Location: [facility];  Service: Gastroenterology;;  . REVISON OF ARTERIOVENOUS FISTULA Left 01/12/5008   Procedure: PLICATION OF DISTAL ANEURYSMAL SEGEMENT OF LEFT UPPER ARM ARTERIOVENOUS FISTULA;  Surgeon: Elam Dutch, MD;  Location: [facility];  Service: Vascular;  Laterality: Left;  . REVISON OF ARTERIOVENOUS FISTULA Left 3/81/8299   Procedure: Plication of Left Upper Arm Fistula ;  Surgeon: Waynetta Sandy, MD;  Location: [facility];  Service: Vascular;  Laterality: Left;  . SKIN GRAFT SPLIT THICKNESS LEG / FOOT Left    SKIN GRAFT SPLIT THICKNESS LEFT ARM DONOR SITE: LEFT  ANTERIOR THIGH  . SKIN SPLIT GRAFT Left 07/04/2017   Procedure: SKIN GRAFT SPLIT THICKNESS LEFT ARM DONOR SITE: LEFT ANTERIOR THIGH;  Surgeon: Elam Dutch, MD;  Location: [facility];  Service: Vascular;  Laterality: Left;  . THROMBECTOMY W/ EMBOLECTOMY Left 06/05/2017   Procedure: EXPLORATION OF LEFT ARM FOR BLEEDING; OVERSEWED PROXIMAL FISTULA;  Surgeon: Angelia Mould, MD;  Location: [facility];  Service: Vascular;  Laterality: Left;  . WOUND EXPLORATION Left 06/03/2017   Procedure: WOUND EXPLORATION WITH WOUND VAC APPLICATION TO LEFT ARM;  Surgeon: Angelia Mould, MD;  Location: [facility];  Service: Vascular;  Laterality: Left;    Current Outpatient Rx  .  Order #: [id number] Class: Historical Med  . Order #: [id number] Class: Normal  . Order #: [id number] Class: Normal  . Order #: [id number] Class: Normal  . Order #: [id number] Class: Historical Med  . Order #: [id number] Class: Print  . Order #: [id number] Class: Normal  . Order #: [id number] Class: No Print  . Order #: [id number] Class: No Print  . Order #: [id number] Class: Historical Med  . Order #: [id number] Class: Historical Med    Allergies Morphine and related, Aspirin, Clonidine derivatives, Tramadol, and Tylenol [acetaminophen]  Family History  Problem Relation Age of Onset  . Heart disease Mother   . Lung cancer Mother   . Heart disease Father   . Malignant hyperthermia Father   . COPD Father   . Throat cancer Sister   . Esophageal cancer Sister   . Hypertension Other   . COPD Other   . Colon cancer Neg Hx   . Colon polyps Neg Hx   . Rectal cancer Neg Hx   . Stomach cancer Neg Hx     Social History Social History   Tobacco Use  . Smoking status: Current Every Day Smoker    Packs/day: 0.50    Years: 43.00    Pack years: 21.50    Types: Cigarettes    Start date: 08/13/1973  . Smokeless tobacco: Never Used  . Tobacco comment: i dont know i just make   Substance Use Topics  . Alcohol use: Not Currently     Frequency: Never    Comment: quit drinking in 2017  . Drug use: Yes    Types: Marijuana, Cocaine    Comment: reports using once every 3 months, 04-06-2019 was this     Review of Systems  All other systems negative except as documented in the HPI. All pertinent positives and negatives as reviewed in the HPI. ____________________________________________   PHYSICAL EXAM:  VITAL SIGNS: ED Triage Vitals  Enc Vitals Group     BP 06/01/19 0500 132/75     Pulse Rate 06/01/19 0501 82     Resp 06/01/19 0501 14     Temp 06/01/19 0502 97.6 F (36.4 C)     Temp Source 06/01/19 0502 Oral     SpO2 06/01/19 0501 100 %    Constitutional: Alert and oriented. Well appearing and in no acute distress. Eyes: Conjunctivae are normal. PERRL. EOMI. Head: Atraumatic. Nose: No congestion/rhinnorhea. Mouth/Throat: Mucous membranes are moist.  Oropharynx non-erythematous. Neck: No stridor.  No meningeal signs.   Cardiovascular: Normal rate, regular rhythm. Good peripheral circulation. Grossly normal heart sounds.   Respiratory: Normal respiratory effort.  No retractions. Lungs CTAB. Gastrointestinal: Soft and nontender. No distention.  Musculoskeletal: No lower extremity tenderness nor edema.  Neurologic:  Normal speech and language. No gross focal neurologic deficits are appreciated.  Skin:  Skin is warm, dry and intact. No rash noted.   ____________________________________________   LABS (all labs ordered are listed, but only abnormal results are displayed)  Labs Reviewed  CBC - Abnormal; Notable for the following components:      Result Value   RBC 3.66 (*)    Hemoglobin 9.9 (*)    HCT 28.3 (*)    MCV 77.3 (*)    All other components within normal limits  BASIC METABOLIC PANEL  TROPONIN I (HIGH SENSITIVITY)   ____________________________________________  EKG   EKG Interpretation  Date/Time:  Friday June 01 2019 05:01:16 EST Ventricular Rate:  81 PR Interval:    QRS  Duration: 183 QT Interval:  567 QTC Calculation: 659  R Axis:   -89 Text Interpretation: Accelerated junctional rhythm Left bundle branch block Confirmed by Veryl Speak [phone]) on 06/01/2019 5:07:17 AM       ____________________________________________  RADIOLOGY  Dg Chest Portable 1 View  Result Date: 06/01/2019 CLINICAL DATA:  Shortness of breath. EXAM: PORTABLE CHEST 1 VIEW COMPARISON:  05/29/2019 CT 05/07/2019 FINDINGS: Unchanged cardiomegaly. Worsening pulmonary edema from prior exam. Question of developing more focal airspace opacity in the left mid lung. Skin folds project over the right hemithorax. No pneumothorax. No significant pleural fluid. Aortic atherosclerosis. IMPRESSION: 1. Worsening pulmonary edema from radiographs 3 days ago. 2. Question of developing more focal airspace opacity in the left mid lung which may represent vascular confluence or infection. 3. Unchanged cardiomegaly. 4.  Aortic Atherosclerosis (ICD10-I70.0). Electronically Signed   By: Keith Rake M.D.   On: 06/01/2019 06:36   ____________________________________________  INITIAL IMPRESSION / ASSESSMENT AND PLAN / ED COURSE  NSR at this time. Will give dose of oral metoprolol. Workup appropriately. Likely able to dc home if stays stable. Not on anticoagulation after multiple consultations with cardiology, will not start now even with high chadsvasc.  xr with worsening pulm edema but no wheezing or crackles on exam. Will get dialysis for same. No fever or productive cough to suggest infectious cause.  First troponin elevated similar to previous. Will recheck to ensur eno change. If stable with HR and trop not significantly diff, will dc.   Care transferred pendign monitoring and repeat troponin.   Pertinent labs & imaging results that were available during my care of the patient were reviewed by me and considered in my medical decision making (see chart for details).  CHA2DS2/VAS Stroke Risk Points   Current as of 4 minutes ago     2 >= 2 Points: High Risk  1 - 1.99 Points: Medium Risk  0 Points: Low Risk    This is the only CHA2DS2/VAS Stroke Risk Points available for the past  year.: Last Change: N/A     Details    This score determines the patient's risk of having a stroke if the  patient has atrial fibrillation.       Points Metrics  0 Has Congestive Heart Failure:  No    Current as of 4 minutes ago  1 Has Vascular Disease:  Yes    Current as of 4 minutes ago  1 Has Hypertension:  Yes    Current as of 4 minutes ago  0 Age:  69    Current as of 4 minutes ago  0 Has Diabetes:  No    Current as of 4 minutes ago  0 Had Stroke:  No  Had TIA:  No  Had thromboembolism:  No    Current as of 4 minutes ago  0 Male:  No    Current as of 4 minutes ago           ____________________________________________  FINAL CLINICAL IMPRESSION(S) / ED DIAGNOSES  Final diagnoses:  None     MEDICATIONS GIVEN DURING THIS VISIT:  Medications  metoprolol tartrate (LOPRESSOR) tablet 50 mg (50 mg Oral Given 06/01/19 0606)     NEW OUTPATIENT MEDICATIONS STARTED DURING THIS VISIT:  New Prescriptions   No medications on file    Note:  This note was prepared with assistance of Dragon voice recognition software. Occasional wrong-word or sound-a-like substitutions may have occurred due to the inherent limitations of voice recognition software.   Breniya Goertzen, Corene Cornea, MD 06/01/19 [phone]

## 2019-06-01 NOTE — ED Notes (Signed)
Patient requesting to use the restroom, this RN offered patient a urinal to which he refused. Pt was also offered a wheelchair to be transported to restroom which he also refused. Pt ambulatory to restroom with nad with this RN standing by for assistance if needed.

## 2019-06-01 NOTE — ED Notes (Addendum)
Patient verbalized understanding of dc instructions, vss, ambulatory with nad. Taxi called for patient and taxi voucher provided per Lynnea Ferrier, CSW

## 2019-06-01 NOTE — ED Triage Notes (Addendum)
Pt comes via [facility] for SOB, went into afib RVR, gave 150mg  amiodarone and then pt converted to NSR , pt is a dialysis pt, due for dialysis today.

## 2019-06-01 NOTE — Progress Notes (Signed)
Renal Navigator received call from EDCM/Camille stating patient has missed OP HD this morning. He is in the ED and does not need to be admitted, but needs his OP HD appointment rescheduled. Navigator contacted [facility] and made appointment for patient at 12:00pm. EDCM to provide cab voucher to patient. Renal Navigator has arranged [employer] to pick patient up from OP HD clinic at 4:30pm to take him home.   Alphonzo Cruise, [place] Renal Navigator [phone]

## 2019-06-02 ENCOUNTER — Encounter (HOSPITAL_COMMUNITY): Payer: Self-pay | Admitting: Emergency Medicine

## 2019-06-02 ENCOUNTER — Inpatient Hospital Stay (HOSPITAL_COMMUNITY)
Admission: EM | Admit: 2019-06-02 | Discharge: 2019-06-04 | DRG: 308 | Disposition: A | Discharge status: Home / Self Care | Payer: Medicare Other | Attending: Internal Medicine | Admitting: Internal Medicine

## 2019-06-02 ENCOUNTER — Emergency Department (HOSPITAL_COMMUNITY): Payer: Medicare Other

## 2019-06-02 ENCOUNTER — Other Ambulatory Visit: Payer: Self-pay

## 2019-06-02 DIAGNOSIS — R188 Other ascites: Secondary | ICD-10-CM | POA: Diagnosis present

## 2019-06-02 DIAGNOSIS — Z8619 Personal history of other infectious and parasitic diseases: Secondary | ICD-10-CM

## 2019-06-02 DIAGNOSIS — Z886 Allergy status to analgesic agent status: Secondary | ICD-10-CM

## 2019-06-02 DIAGNOSIS — N186 End stage renal disease: Secondary | ICD-10-CM

## 2019-06-02 DIAGNOSIS — I4891 Unspecified atrial fibrillation: Secondary | ICD-10-CM | POA: Diagnosis not present

## 2019-06-02 DIAGNOSIS — D649 Anemia, unspecified: Secondary | ICD-10-CM | POA: Diagnosis present

## 2019-06-02 DIAGNOSIS — Z20828 Contact with and (suspected) exposure to other viral communicable diseases: Secondary | ICD-10-CM | POA: Diagnosis present

## 2019-06-02 DIAGNOSIS — N2581 Secondary hyperparathyroidism of renal origin: Secondary | ICD-10-CM | POA: Diagnosis present

## 2019-06-02 DIAGNOSIS — Z885 Allergy status to narcotic agent status: Secondary | ICD-10-CM

## 2019-06-02 DIAGNOSIS — Z825 Family history of asthma and other chronic lower respiratory diseases: Secondary | ICD-10-CM

## 2019-06-02 DIAGNOSIS — I1311 Hypertensive heart and chronic kidney disease without heart failure, with stage 5 chronic kidney disease, or end stage renal disease: Secondary | ICD-10-CM | POA: Diagnosis present

## 2019-06-02 DIAGNOSIS — Z8249 Family history of ischemic heart disease and other diseases of the circulatory system: Secondary | ICD-10-CM

## 2019-06-02 DIAGNOSIS — Z9981 Dependence on supplemental oxygen: Secondary | ICD-10-CM

## 2019-06-02 DIAGNOSIS — M19012 Primary osteoarthritis, left shoulder: Secondary | ICD-10-CM | POA: Diagnosis present

## 2019-06-02 DIAGNOSIS — B182 Chronic viral hepatitis C: Secondary | ICD-10-CM | POA: Diagnosis present

## 2019-06-02 DIAGNOSIS — I4892 Unspecified atrial flutter: Secondary | ICD-10-CM | POA: Diagnosis present

## 2019-06-02 DIAGNOSIS — I050 Rheumatic mitral stenosis: Secondary | ICD-10-CM | POA: Diagnosis present

## 2019-06-02 DIAGNOSIS — Z87442 Personal history of urinary calculi: Secondary | ICD-10-CM

## 2019-06-02 DIAGNOSIS — F17210 Nicotine dependence, cigarettes, uncomplicated: Secondary | ICD-10-CM | POA: Diagnosis present

## 2019-06-02 DIAGNOSIS — Z79899 Other long term (current) drug therapy: Secondary | ICD-10-CM

## 2019-06-02 DIAGNOSIS — Z992 Dependence on renal dialysis: Secondary | ICD-10-CM

## 2019-06-02 DIAGNOSIS — I4819 Other persistent atrial fibrillation: Principal | ICD-10-CM | POA: Diagnosis present

## 2019-06-02 DIAGNOSIS — R072 Precordial pain: Secondary | ICD-10-CM

## 2019-06-02 DIAGNOSIS — I252 Old myocardial infarction: Secondary | ICD-10-CM

## 2019-06-02 DIAGNOSIS — Z888 Allergy status to other drugs, medicaments and biological substances status: Secondary | ICD-10-CM

## 2019-06-02 DIAGNOSIS — I2510 Atherosclerotic heart disease of native coronary artery without angina pectoris: Secondary | ICD-10-CM | POA: Diagnosis present

## 2019-06-02 DIAGNOSIS — Z9115 Patient's noncompliance with renal dialysis: Secondary | ICD-10-CM

## 2019-06-02 DIAGNOSIS — Z7951 Long term (current) use of inhaled steroids: Secondary | ICD-10-CM

## 2019-06-02 DIAGNOSIS — I700 Atherosclerosis of aorta: Secondary | ICD-10-CM | POA: Diagnosis present

## 2019-06-02 DIAGNOSIS — R001 Bradycardia, unspecified: Secondary | ICD-10-CM | POA: Diagnosis not present

## 2019-06-02 DIAGNOSIS — K7290 Hepatic failure, unspecified without coma: Secondary | ICD-10-CM | POA: Diagnosis present

## 2019-06-02 DIAGNOSIS — R079 Chest pain, unspecified: Secondary | ICD-10-CM | POA: Diagnosis not present

## 2019-06-02 DIAGNOSIS — B181 Chronic viral hepatitis B without delta-agent: Secondary | ICD-10-CM | POA: Diagnosis present

## 2019-06-02 DIAGNOSIS — Z792 Long term (current) use of antibiotics: Secondary | ICD-10-CM

## 2019-06-02 DIAGNOSIS — I447 Left bundle-branch block, unspecified: Secondary | ICD-10-CM | POA: Diagnosis present

## 2019-06-02 DIAGNOSIS — R0602 Shortness of breath: Secondary | ICD-10-CM

## 2019-06-02 DIAGNOSIS — Z9119 Patient's noncompliance with other medical treatment and regimen: Secondary | ICD-10-CM

## 2019-06-02 DIAGNOSIS — K7460 Unspecified cirrhosis of liver: Secondary | ICD-10-CM | POA: Diagnosis present

## 2019-06-02 DIAGNOSIS — Z955 Presence of coronary angioplasty implant and graft: Secondary | ICD-10-CM

## 2019-06-02 DIAGNOSIS — J449 Chronic obstructive pulmonary disease, unspecified: Secondary | ICD-10-CM | POA: Diagnosis present

## 2019-06-02 LAB — COMPREHENSIVE METABOLIC PANEL
ALT: 28 U/L (ref 0–44)
AST: 48 U/L — ABNORMAL HIGH (ref 15–41)
Albumin: 3.5 g/dL (ref 3.5–5.0)
Alkaline Phosphatase: 118 U/L (ref 38–126)
Anion gap: 11 (ref 5–15)
BUN: 26 mg/dL — ABNORMAL HIGH (ref 6–20)
CO2: 29 mmol/L (ref 22–32)
Calcium: 9.8 mg/dL (ref 8.9–10.3)
Chloride: 99 mmol/L (ref 98–111)
Creatinine, Ser: 5.96 mg/dL — ABNORMAL HIGH (ref 0.61–1.24)
GFR calc Af Amer: 11 mL/min — ABNORMAL LOW (ref >60)
GFR calc non Af Amer: 10 mL/min — ABNORMAL LOW (ref >60)
Glucose, Bld: 120 mg/dL — ABNORMAL HIGH (ref 70–99)
Potassium: 3.8 mmol/L (ref 3.5–5.1)
Sodium: 139 mmol/L (ref 135–145)
Total Bilirubin: 0.4 mg/dL (ref 0.3–1.2)
Total Protein: 6.9 g/dL (ref 6.5–8.1)

## 2019-06-02 LAB — I-STAT CHEM 8, ED
BUN: 28 mg/dL — ABNORMAL HIGH (ref 6–20)
Calcium, Ion: 1.15 mmol/L (ref 1.15–1.40)
Chloride: 98 mmol/L (ref 98–111)
Creatinine, Ser: 6 mg/dL — ABNORMAL HIGH (ref 0.61–1.24)
Glucose, Bld: 118 mg/dL — ABNORMAL HIGH (ref 70–99)
HCT: 32 % — ABNORMAL LOW (ref 39.0–52.0)
Hemoglobin: 10.9 g/dL — ABNORMAL LOW (ref 13.0–17.0)
Potassium: 3.7 mmol/L (ref 3.5–5.1)
Sodium: 137 mmol/L (ref 135–145)
TCO2: 29 mmol/L (ref 22–32)

## 2019-06-02 LAB — CBC WITH DIFFERENTIAL/PLATELET
Abs Immature Granulocytes: 0.04 10*3/uL (ref 0.00–0.07)
Basophils Absolute: 0.1 10*3/uL (ref 0.0–0.1)
Basophils Relative: 1 %
Eosinophils Absolute: 0.1 10*3/uL (ref 0.0–0.5)
Eosinophils Relative: 1 %
HCT: 27.6 % — ABNORMAL LOW (ref 39.0–52.0)
Hemoglobin: 9.9 g/dL — ABNORMAL LOW (ref 13.0–17.0)
Immature Granulocytes: 0 %
Lymphocytes Relative: 13 %
Lymphs Abs: 1.2 10*3/uL (ref 0.7–4.0)
MCH: 27.7 pg (ref 26.0–34.0)
MCHC: 35.9 g/dL (ref 30.0–36.0)
MCV: 77.1 fL — ABNORMAL LOW (ref 80.0–100.0)
Monocytes Absolute: 0.8 10*3/uL (ref 0.1–1.0)
Monocytes Relative: 9 %
Neutro Abs: 7.3 10*3/uL (ref 1.7–7.7)
Neutrophils Relative %: 76 %
Platelets: 181 10*3/uL (ref 150–400)
RBC: 3.58 MIL/uL — ABNORMAL LOW (ref 4.22–5.81)
RDW: 15.1 % (ref 11.5–15.5)
WBC: 9.6 10*3/uL (ref 4.0–10.5)
nRBC: 0 % (ref 0.0–0.2)

## 2019-06-02 LAB — TROPONIN I (HIGH SENSITIVITY)
Troponin I (High Sensitivity): 132 ng/L (ref <18)
Troponin I (High Sensitivity): 138 ng/L (ref <18)

## 2019-06-02 LAB — MAGNESIUM: Magnesium: 1.8 mg/dL (ref 1.7–2.4)

## 2019-06-02 LAB — POC SARS CORONAVIRUS 2 AG -  ED: SARS Coronavirus 2 Ag: NEGATIVE

## 2019-06-02 LAB — LIPASE, BLOOD: Lipase: 34 U/L (ref 11–51)

## 2019-06-02 LAB — SARS CORONAVIRUS 2 (TAT 6-24 HRS): SARS Coronavirus 2: NEGATIVE

## 2019-06-02 IMAGING — US IR PARACENTESIS
1 series · 2 of 2 positions shown · non-contrast
Comparison: none

INDICATION: Patient with history of end-stage renal disease, end-stage liver
disease/hepatitis B, C, recurrent ascites, recent SBP. Request
received for diagnostic and therapeutic paracentesis.

[Series 1: ir (id) (id)/(id)/(id) ir · 2 of 2 slices shown]
[im 1/2]
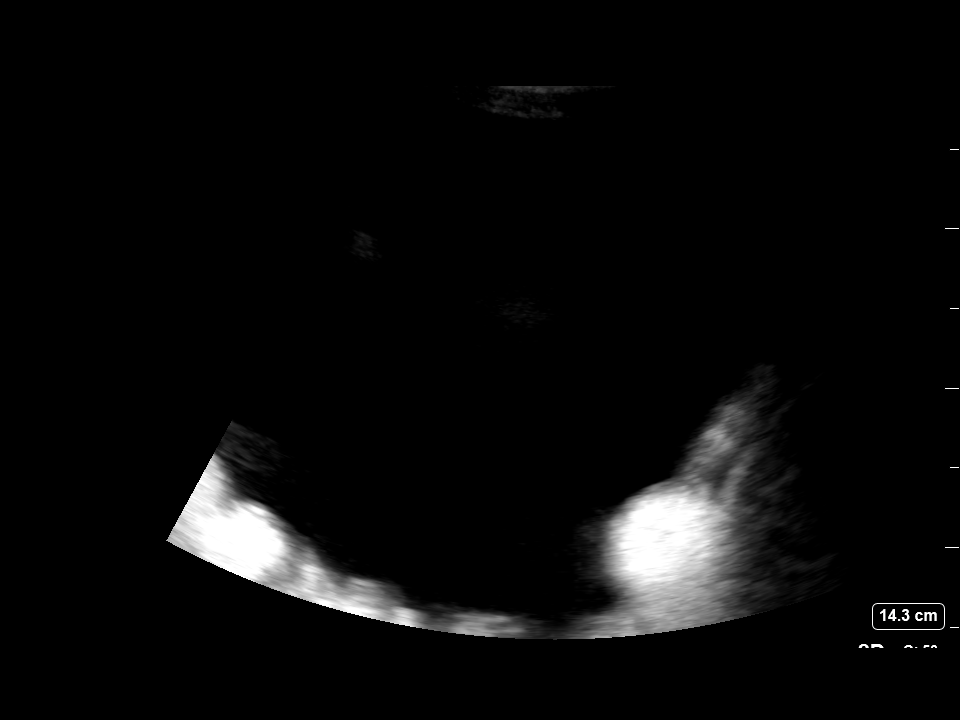
[im 2/2]
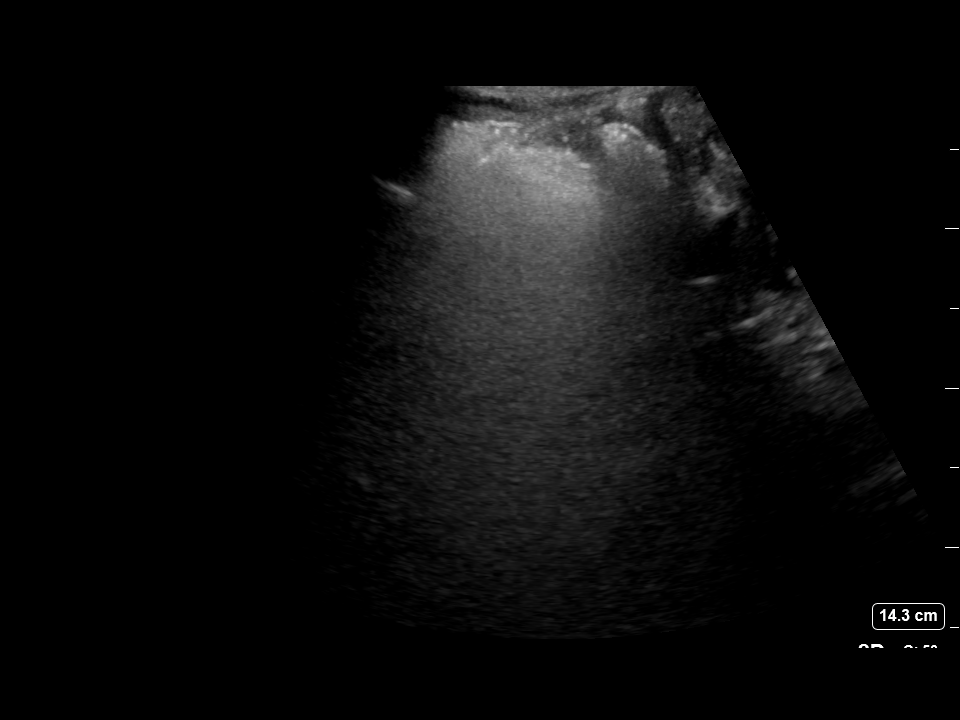

[2 of 2 positions shown; findings below may reference images not displayed]

EXAM:
ULTRASOUND GUIDED DIAGNOSTIC AND THERAPEUTIC PARACENTESIS

MEDICATIONS:
None

COMPLICATIONS:
None immediate.

PROCEDURE:
Informed written consent was obtained from the patient after a
discussion of the risks, benefits and alternatives to treatment. A
timeout was performed prior to the initiation of the procedure.

Initial ultrasound scanning demonstrates a small to moderate amount
of ascites within the left lower abdominal quadrant. The left lower
abdomen was prepped and draped in the usual sterile fashion. 2%
lidocaine was used for local anesthesia.

Following this, a 6 Fr Safe-T-Centesis catheter was introduced. An
ultrasound image was saved for documentation purposes. The
paracentesis was performed. The catheter was removed and a dressing
was applied. The patient tolerated the procedure well without
immediate post procedural complication.
FINDINGS: A total of approximately 1.9 liters of slightly hazy, yellow fluid
was removed. Samples were sent to the laboratory as requested by the
clinical team.
IMPRESSION: Successful ultrasound-guided diagnostic and therapeutic paracentesis
yielding 1.9 liters of peritoneal fluid.

## 2019-06-02 MED ORDER — ACETAMINOPHEN 325 MG PO TABS: 650.0000 mg | ORAL_TABLET | Freq: Four times a day (QID) | ORAL | Status: DC | PRN

## 2019-06-02 MED ORDER — HEPARIN SODIUM (PORCINE) 5000 UNIT/ML IJ SOLN
5000.0000 [IU] | Freq: Three times a day (TID) | INTRAMUSCULAR | Status: DC
  Administered 2019-06-02 (×2): 5000 [IU] via SUBCUTANEOUS
  Filled 2019-06-02 (×3): qty 1

## 2019-06-02 MED ORDER — MOMETASONE FURO-FORMOTEROL FUM 200-5 MCG/ACT IN AERO
2.0000 | INHALATION_SPRAY | Freq: Two times a day (BID) | RESPIRATORY_TRACT | Status: DC
  Administered 2019-06-03 – 2019-06-04 (×4): 2 via RESPIRATORY_TRACT
  Filled 2019-06-02 (×2): qty 8.8

## 2019-06-02 MED ORDER — SEVELAMER CARBONATE 800 MG PO TABS
1600.0000 mg | ORAL_TABLET | Freq: Three times a day (TID) | ORAL | Status: DC
  Administered 2019-06-02 – 2019-06-04 (×6): 1600 mg via ORAL
  Filled 2019-06-02 (×6): qty 2

## 2019-06-02 MED ORDER — HYDROXYZINE HCL 25 MG PO TABS: 25.0000 mg | ORAL_TABLET | Freq: Three times a day (TID) | ORAL | Status: DC | PRN

## 2019-06-02 MED ORDER — PANTOPRAZOLE SODIUM 40 MG PO TBEC
40.0000 mg | DELAYED_RELEASE_TABLET | Freq: Every day | ORAL | Status: DC
  Administered 2019-06-03 – 2019-06-04 (×2): 40 mg via ORAL
  Filled 2019-06-02 (×2): qty 1

## 2019-06-02 MED ORDER — ONDANSETRON 4 MG PO TBDP
4.0000 mg | ORAL_TABLET | Freq: Three times a day (TID) | ORAL | Status: DC | PRN
  Administered 2019-06-04: 4 mg via ORAL
  Filled 2019-06-02: qty 1

## 2019-06-02 MED ORDER — SULFAMETHOXAZOLE-TRIMETHOPRIM 800-160 MG PO TABS
1.0000 | ORAL_TABLET | Freq: Every day | ORAL | Status: DC
  Administered 2019-06-02 – 2019-06-04 (×3): 1 via ORAL
  Filled 2019-06-02 (×3): qty 1

## 2019-06-02 MED ORDER — ZOLPIDEM TARTRATE 5 MG PO TABS
10.0000 mg | ORAL_TABLET | Freq: Every evening | ORAL | Status: DC | PRN
  Administered 2019-06-02: 10 mg via ORAL
  Filled 2019-06-02: qty 2

## 2019-06-02 MED ORDER — DILTIAZEM HCL-DEXTROSE 125-5 MG/125ML-% IV SOLN (PREMIX)
5.0000 mg/h | INTRAVENOUS | Status: DC
  Administered 2019-06-02 – 2019-06-03 (×2): 5 mg/h via INTRAVENOUS
  Filled 2019-06-02 (×2): qty 125

## 2019-06-02 MED ORDER — LACTULOSE 10 GM/15ML PO SOLN
30.0000 g | Freq: Two times a day (BID) | ORAL | Status: DC | PRN
  Filled 2019-06-02: qty 45

## 2019-06-02 MED ORDER — ALBUTEROL SULFATE (2.5 MG/3ML) 0.083% IN NEBU: 3.0000 mL | INHALATION_SOLUTION | Freq: Four times a day (QID) | RESPIRATORY_TRACT | Status: DC | PRN

## 2019-06-02 MED ORDER — MONTELUKAST SODIUM 10 MG PO TABS
10.0000 mg | ORAL_TABLET | Freq: Every evening | ORAL | Status: DC
  Administered 2019-06-02 – 2019-06-03 (×2): 10 mg via ORAL
  Filled 2019-06-02 (×2): qty 1

## 2019-06-02 MED ORDER — ACETAMINOPHEN 325 MG PO TABS
650.0000 mg | ORAL_TABLET | Freq: Once | ORAL | Status: AC
  Administered 2019-06-02: 650 mg via ORAL
  Filled 2019-06-02: qty 2

## 2019-06-02 NOTE — H&P (Signed)
Date: 06/02/2019               Patient Name:  [name] MRN: [id number]  DOB: Jul 03, 1961 Age / Sex: 58 y.o., male   PCP: [name], FNP         Medical Service: Internal Medicine Teaching Service         Attending Physician: Dr. Aldine Contes, MD    First Contact: Dr.  Tamsen Snider Pager: [phone]  Second Contact: Dr. Modena Nunnery Pager: [phone]       After Hours (After 5p/  First Contact Pager: [phone]  weekends / holidays): Second Contact Pager: [phone]   Chief Complaint: My heart is beating fast  History of Present Illness: [name] is a 58 year old male with past medical history of decompensated cirrhosis with recurrent ascites and history of SBP, ESRD on HD, paroxysmal atrial fibrillation, COPD, HTN, and CAD status post stent 02/2017 , recent admission for atrial fibrillation who presented with tachycardia. Patient reports compliance on home metoprolol. Says he took extra doses today , total of 125 mg. He was started on diltiazem drip in ED and rate is controlled on exam. Denies chest pain, abdominal pain, or changes in bowels .  Meds:  No current facility-administered medications on file prior to encounter.    Current Outpatient Medications on File Prior to Encounter  Medication Sig Dispense Refill  . albuterol (VENTOLIN HFA) 108 (90 Base) MCG/ACT inhaler Inhale 2 puffs into the lungs every 6 (six) hours as needed for wheezing or shortness of breath.    . hydrALAZINE (APRESOLINE) 100 MG tablet Take 0.5 tablets (50 mg total) by mouth 3 (three) times daily. 150 tablet 0  . metoprolol tartrate (LOPRESSOR) 50 MG tablet Take 1 tablet (50 mg total) by mouth 2 (two) times daily. 60 tablet 0  . montelukast (SINGULAIR) 10 MG tablet Take 10 mg by mouth every evening.     . ondansetron (ZOFRAN ODT) 4 MG disintegrating tablet Take 1 tablet (4 mg total) by mouth every 8 (eight) hours as needed for nausea or vomiting. 10 tablet 1  . pantoprazole  (PROTONIX) 40 MG tablet Take 1 tablet (40 mg total) by mouth daily. 90 tablet 3  . sevelamer carbonate (RENVELA) 800 MG tablet Take 4 tablets with meals  And 2 tablets twice daily with snacks (Patient taking differently: Take 1,600 mg by mouth See admin instructions. Take 2 tablets (1600mg ) with meals & with snacks)    . sulfamethoxazole-trimethoprim (BACTRIM DS) 800-160 MG tablet Take 1 tablet by mouth daily. (Patient taking differently: Take 1 tablet by mouth 2 (two) times daily. ) 30 tablet 11  . SYMBICORT 160-4.5 MCG/ACT inhaler Inhale 2 puffs into the lungs 2 (two) times daily.     Marland Kitchen zolpidem (AMBIEN) 10 MG tablet Take 10 mg by mouth at bedtime as needed for sleep.      Current Facility-Administered Medications for the 06/02/19 encounter [facility] Encounter)  Medication  . albumin human 25 % solution 50 g   Current Meds  Medication Sig  . albuterol (VENTOLIN HFA) 108 (90 Base) MCG/ACT inhaler Inhale 2 puffs into the lungs every 6 (six) hours as needed for wheezing or shortness of breath.  . hydrALAZINE (APRESOLINE) 100 MG tablet Take 0.5 tablets (50 mg total) by mouth 3 (three) times daily.  . metoprolol tartrate (LOPRESSOR) 50 MG tablet Take 1 tablet (50 mg total) by mouth 2 (two) times daily.  . montelukast (SINGULAIR) 10 MG  tablet Take 10 mg by mouth every evening.   . ondansetron (ZOFRAN ODT) 4 MG disintegrating tablet Take 1 tablet (4 mg total) by mouth every 8 (eight) hours as needed for nausea or vomiting.  . pantoprazole (PROTONIX) 40 MG tablet Take 1 tablet (40 mg total) by mouth daily.  . sevelamer carbonate (RENVELA) 800 MG tablet Take 4 tablets with meals  And 2 tablets twice daily with snacks (Patient taking differently: Take 1,600 mg by mouth See admin instructions. Take 2 tablets (1600mg ) with meals & with snacks)  . sulfamethoxazole-trimethoprim (BACTRIM DS) 800-160 MG tablet Take 1 tablet by mouth daily. (Patient taking differently: Take 1 tablet by mouth 2 (two) times  daily. )  . SYMBICORT 160-4.5 MCG/ACT inhaler Inhale 2 puffs into the lungs 2 (two) times daily.   Marland Kitchen zolpidem (AMBIEN) 10 MG tablet Take 10 mg by mouth at bedtime as needed for sleep.     Allergies: Allergies as of 06/02/2019 - Review Complete 06/02/2019  Allergen Reaction Noted  . Morphine and related Other (See Comments) 12/23/2017  . Aspirin Other (See Comments) 02/27/2014  . Clonidine derivatives Itching 08/05/2013  . Tramadol Itching 02/25/2011  . Tylenol [acetaminophen] Nausea Only 10/27/2017   Past Medical History:  Diagnosis Date  . Anemia   . Anxiety   . Arthritis    left shoulder  . Atherosclerosis of aorta ([place])   . Cardiomegaly   . Chest pain    DATE UNKNOWN, C/O PERIODICALLY  . Cocaine abuse ([place])   . COPD exacerbation ([place]) 08/17/2016  . Coronary artery disease    stent 02/22/17  . ESRD (end stage renal disease) on dialysis ([place])    "[name]; MWF" (07/04/2017)  . GERD (gastroesophageal reflux disease)    DATE UNKNOWN  . Hemorrhoids   . Hepatitis B, chronic ([place])   . Hepatitis C   . History of kidney stones   . Hyperkalemia   . Hypertension   . Kidney failure   . Metabolic bone disease    Patient denies  . Mitral stenosis   . Myocardial infarction ([place])   . Pneumonia   . Pulmonary edema   . Solitary rectal ulcer syndrome 07/2017   at [facility] for rectal bleeding  . Tubular adenoma of colon     Family History:  Family History  Problem Relation Age of Onset  . Heart disease Mother   . Lung cancer Mother   . Heart disease Father   . Malignant hyperthermia Father   . COPD Father   . Throat cancer Sister   . Esophageal cancer Sister   . Hypertension Other   . COPD Other   . Colon cancer Neg Hx   . Colon polyps Neg Hx   . Rectal cancer Neg Hx   . Stomach cancer Neg Hx      Social History:  Social History   Tobacco Use  . Smoking status: Current Every Day Smoker    Packs/day: 0.50    Years: 43.00    Pack years: 21.50    Types:  Cigarettes    Start date: 08/13/1973  . Smokeless tobacco: Never Used  . Tobacco comment: i dont know i just make   Substance Use Topics  . Alcohol use: Not Currently    Frequency: Never    Comment: quit drinking in 2017  . Drug use: Yes    Types: Marijuana, Cocaine    Comment: reports using once every 3 months, 04-06-2019 was this  Review of Systems: A complete ROS was negative except as per HPI.   Physical Exam: Blood pressure 96/83, pulse 66, temperature 97.9 F (36.6 C), temperature source Oral, resp. rate 20, SpO2 100 %..  Gen: Patient resting in bed, NAD Cardiovascular: Regular rate , irregular rhythm.  Pulmonary: CTAB, no wheezes or rales GI: normal bowel sounds , not tender Muscoskeletal: No LEE, no deformity  Skin: warm and dry Neurological No focal deficients, alert on exam  Psych: Calm, cooperative.  EKG: personally reviewed my interpretation is rate ~100bpm , LAD, LVH, atrial flutter.   Assessment & Plan by Problem: Active Problems:   Atrial fibrillation with RVR ([place])  [name] is a 58 year old male with past medical history of decompensated cirrhosis with recurrent ascites and history of SBP, ESRD on HD, paroxysmal atrial fibrillation, COPD, HTN, and CAD status post stent 02/2017 who presented with tachycardia.  #Afib/Aflutter  Patient with recent admissions for Afib. Reports compliance on medications. Reported chest pain and SOB in ED, but resolved with rate control.  - Continue Diltiazem drip  #ESRD on HD M/W/F - reports half HD session on Friday due to heart rate, consult nephrology tomorrow for HD  #Hx of Cirrhosis  - continued on SBP ppx, lactulose; no evidence of acute decompensation   Dispo: Admit patient to Observation with expected length of stay less than 2 midnights.  Signed:  Tamsen Snider, MD PGY1  [phone]

## 2019-06-02 NOTE — ED Notes (Signed)
Admitting providers at bedside

## 2019-06-02 NOTE — ED Provider Notes (Signed)
[facility] EMERGENCY DEPARTMENT Provider Note   CSN: [id number] Arrival date & time: 06/02/19  1138     History   Chief Complaint Chief Complaint  Patient presents with  . Chest Pain    HPI [name] is a 58 y.o. male.     The history is provided by the patient and medical records. No language interpreter was used.  Chest Pain Pain location:  Substernal area Pain quality: burning and pressure   Pain radiates to:  Does not radiate Pain severity:  Severe Onset quality:  Sudden Duration:  1 hour Timing:  Constant Progression:  Unchanged Chronicity:  Recurrent Relieved by:  Nothing Worsened by:  Nothing Ineffective treatments:  None tried Associated symptoms: cough, fatigue, palpitations and shortness of breath   Associated symptoms: no abdominal pain, no back pain, no diaphoresis, no dizziness, no fever, no headache, no nausea, no near-syncope, no vomiting and no weakness   Risk factors: male sex     Past Medical History:  Diagnosis Date  . Anemia   . Anxiety   . Arthritis    left shoulder  . Atherosclerosis of aorta ([place])   . Cardiomegaly   . Chest pain    DATE UNKNOWN, C/O PERIODICALLY  . Cocaine abuse ([place])   . COPD exacerbation ([place]) 08/17/2016  . Coronary artery disease    stent 02/22/17  . ESRD (end stage renal disease) on dialysis ([place])    "[name]; MWF" (07/04/2017)  . GERD (gastroesophageal reflux disease)    DATE UNKNOWN  . Hemorrhoids   . Hepatitis B, chronic ([place])   . Hepatitis C   . History of kidney stones   . Hyperkalemia   . Hypertension   . Kidney failure   . Metabolic bone disease    Patient denies  . Mitral stenosis   . Myocardial infarction ([place])   . Pneumonia   . Pulmonary edema   . Solitary rectal ulcer syndrome 07/2017   at [facility] for rectal bleeding  . Tubular adenoma of colon     Patient Active Problem List   Diagnosis Date Noted  . A-fib ([place]) 05/30/2019  . Atrial fibrillation with  RVR ([place]) 05/29/2019  . Melena   . Pressure injury of skin 03/09/2019  . Abdominal distention   . Volume overload 12/28/2018  . Sepsis ([place]) 09/12/2018  . Atherosclerosis of native coronary artery of native heart without angina pectoris 03/11/2018  . Benign neoplasm of cecum   . Benign neoplasm of ascending colon   . Benign neoplasm of descending colon   . Benign neoplasm of rectum   . Paroxysmal atrial fibrillation ([place]) 01/23/2018  . Hx of colonic polyps 01/20/2018  . ESRD (end stage renal disease) ([place]) 11/21/2017  . GERD (gastroesophageal reflux disease) 11/16/2017  . Liver cirrhosis ([place]) 11/15/2017  . DNR (do not resuscitate)   . Palliative care by specialist   . Hyponatremia 11/04/2017  . SBP (spontaneous bacterial peritonitis) ([place]) 10/30/2017  . Liver disease, chronic 10/30/2017  . SOB (shortness of breath)   . Abdominal pain 10/28/2017  . Upper airway cough syndrome with flattening on f/v loop 10/13/17 c/w vcd 10/17/2017  . Elevated diaphragm 10/13/2017  . Ileus ([place]) 09/29/2017  . Malnutrition of moderate degree 09/29/2017  . Sinus congestion 09/03/2017  . Symptomatic anemia 09/02/2017  . Other cirrhosis of liver ([place]) 09/02/2017  . Left bundle branch block 09/02/2017  . Mitral stenosis 09/02/2017  . Hematochezia 07/15/2017  . Wide-complex tachycardia ([place])   .  Endotracheally intubated   . ESRD on dialysis ([place]) 07/04/2017  . Acute respiratory failure with hypoxia ([place]) 06/18/2017  . CKD (chronic kidney disease) stage V requiring chronic dialysis ([place]) 06/18/2017  . History of Cocaine abuse ([place]) 06/18/2017  . Hypertension 06/18/2017  . Infection of AV graft for dialysis ([place]) 06/18/2017  . Anxiety 06/18/2017  . Anemia due to chronic kidney disease 06/18/2017  . Atrial flutter with rapid ventricular response ([place]) 06/18/2017  . Personality disorder ([place]) 06/13/2017  . Cellulitis 06/12/2017  . Adjustment disorder with mixed anxiety and depressed mood 06/10/2017   . Suicidal ideation 06/10/2017  . Arm wound, left, sequela 06/10/2017  . Dyspnea on exertion 05/29/2017  . Tachycardia 05/29/2017  . Hyperkalemia 05/22/2017  . Acute metabolic encephalopathy   . Anemia 04/23/2017  . Ascites 04/23/2017  . COPD (chronic obstructive pulmonary disease) ([place]) 04/23/2017  . Acute on chronic respiratory failure with hypoxia ([place]) 03/25/2017  . Arrhythmia 03/25/2017  . COPD GOLD 0 with flattening on inps f/v  09/27/2016  . Essential hypertension 09/27/2016  . Fluid overload 08/30/2016  . COPD exacerbation ([place]) 08/17/2016  . Hypertensive urgency 08/17/2016  . Respiratory failure ([place]) 08/17/2016  . Problem with dialysis access ([place]) 07/23/2016  . Chronic hepatitis B ([place]) 03/05/2014  . Chronic hepatitis C without hepatic coma ([place]) 03/05/2014  . Internal hemorrhoids with bleeding, swelling and itching 03/05/2014  . Thrombocytopenia ([place]) 03/05/2014  . Chest pain 02/27/2014  . Alcohol abuse 04/14/2009  . Cigarette smoker 04/14/2009  . GANGLION CYST 04/14/2009    Past Surgical History:  Procedure Laterality Date  . A/V FISTULAGRAM Left 05/26/2017   Procedure: A/V FISTULAGRAM;  Surgeon: Conrad Wild Rose, MD;  Location: [facility];  Service: Cardiovascular;  Laterality: Left;  . A/V FISTULAGRAM Right 11/18/2017   Procedure: A/V FISTULAGRAM - Right Arm;  Surgeon: Elam Dutch, MD;  Location: [facility];  Service: Cardiovascular;  Laterality: Right;  . APPLICATION OF WOUND VAC Left 06/14/2017   Procedure: APPLICATION OF WOUND VAC;  Surgeon: Katha Cabal, MD;  Location: [facility];  Service: Vascular;  Laterality: Left;  . AV FISTULA PLACEMENT  2012   BELIEVED WAS PLACED IN JUNE  . AV FISTULA PLACEMENT Right 08/09/2017   Procedure: Creation Right arm ARTERIOVENOUS BRACHIOCEPOHALIC FISTULA;  Surgeon: Elam Dutch, MD;  Location: [facility] OR;  Service: Vascular;  Laterality: Right;  . AV FISTULA PLACEMENT Right 11/22/2017   Procedure:  INSERTION OF ARTERIOVENOUS (AV) GORE-TEX GRAFT RIGHT UPPER ARM;  Surgeon: Elam Dutch, MD;  Location: [facility];  Service: Vascular;  Laterality: Right;  . BIOPSY  01/25/2018   Procedure: BIOPSY;  Surgeon: Jerene Bears, MD;  Location: [facility];  Service: Gastroenterology;;  . BIOPSY  04/10/2019   Procedure: BIOPSY;  Surgeon: Jerene Bears, MD;  Location: [facility];  Service: Gastroenterology;;  . COLONOSCOPY    . COLONOSCOPY WITH PROPOFOL N/A 01/25/2018   Procedure: COLONOSCOPY WITH PROPOFOL;  Surgeon: Jerene Bears, MD;  Location: [facility];  Service: Gastroenterology;  Laterality: N/A;  . CORONARY STENT INTERVENTION N/A 02/22/2017   Procedure: CORONARY STENT INTERVENTION;  Surgeon: Nigel Mormon, MD;  Location: [facility];  Service: Cardiovascular;  Laterality: N/A;  . ESOPHAGOGASTRODUODENOSCOPY (EGD) WITH PROPOFOL N/A 01/25/2018   Procedure: ESOPHAGOGASTRODUODENOSCOPY (EGD) WITH PROPOFOL;  Surgeon: Jerene Bears, MD;  Location: [facility];  Service: Gastroenterology;  Laterality: N/A;  . ESOPHAGOGASTRODUODENOSCOPY (EGD) WITH PROPOFOL N/A 04/10/2019   Procedure: ESOPHAGOGASTRODUODENOSCOPY (EGD) WITH  PROPOFOL;  Surgeon: Jerene Bears, MD;  Location: [facility];  Service: Gastroenterology;  Laterality: N/A;  . FLEXIBLE SIGMOIDOSCOPY N/A 07/15/2017   Procedure: FLEXIBLE SIGMOIDOSCOPY;  Surgeon: Carol Ada, MD;  Location: [facility];  Service: Endoscopy;  Laterality: N/A;  . HEMORRHOID BANDING    . I&D EXTREMITY Left 06/01/2017   Procedure: IRRIGATION AND DEBRIDEMENT LEFT ARM HEMATOMA WITH LIGATION OF LEFT ARM AV FISTULA;  Surgeon: Elam Dutch, MD;  Location: [facility];  Service: Vascular;  Laterality: Left;  . I&D EXTREMITY Left 06/14/2017   Procedure: IRRIGATION AND DEBRIDEMENT EXTREMITY;  Surgeon: Katha Cabal, MD;  Location: [facility];  Service: Vascular;  Laterality: Left;  . INSERTION OF DIALYSIS CATHETER  05/30/2017  . INSERTION OF DIALYSIS CATHETER N/A  05/30/2017   Procedure: INSERTION OF DIALYSIS CATHETER;  Surgeon: Elam Dutch, MD;  Location: [facility];  Service: Vascular;  Laterality: N/A;  . IR PARACENTESIS  08/30/2017  . IR PARACENTESIS  09/29/2017  . IR PARACENTESIS  10/28/2017  . IR PARACENTESIS  11/09/2017  . IR PARACENTESIS  11/16/2017  . IR PARACENTESIS  11/28/2017  . IR PARACENTESIS  12/01/2017  . IR PARACENTESIS  12/06/2017  . IR PARACENTESIS  01/03/2018  . IR PARACENTESIS  01/23/2018  . IR PARACENTESIS  02/07/2018  . IR PARACENTESIS  02/21/2018  . IR PARACENTESIS  03/06/2018  . IR PARACENTESIS  03/17/2018  . IR PARACENTESIS  04/04/2018  . IR PARACENTESIS  12/28/2018  . IR PARACENTESIS  01/08/2019  . IR PARACENTESIS  01/23/2019  . IR PARACENTESIS  02/01/2019  . IR PARACENTESIS  02/19/2019  . IR PARACENTESIS  03/01/2019  . IR PARACENTESIS  03/15/2019  . IR PARACENTESIS  04/03/2019  . IR PARACENTESIS  04/12/2019  . IR PARACENTESIS  05/01/2019  . IR PARACENTESIS  05/08/2019  . IR PARACENTESIS  05/24/2019  . IR RADIOLOGIST EVAL & MGMT  02/14/2018  . IR RADIOLOGIST EVAL & MGMT  02/22/2019  . LEFT HEART CATH AND CORONARY ANGIOGRAPHY N/A 02/22/2017   Procedure: LEFT HEART CATH AND CORONARY ANGIOGRAPHY;  Surgeon: Nigel Mormon, MD;  Location: [facility];  Service: Cardiovascular;  Laterality: N/A;  . LIGATION OF ARTERIOVENOUS  FISTULA Left 09/11/6710   Procedure: Plication of Left Arm Arteriovenous Fistula;  Surgeon: Elam Dutch, MD;  Location: [facility];  Service: Vascular;  Laterality: Left;  . POLYPECTOMY    . POLYPECTOMY  01/25/2018   Procedure: POLYPECTOMY;  Surgeon: Jerene Bears, MD;  Location: [facility];  Service: Gastroenterology;;  . REVISON OF ARTERIOVENOUS FISTULA Left 4/58/0998   Procedure: PLICATION OF DISTAL ANEURYSMAL SEGEMENT OF LEFT UPPER ARM ARTERIOVENOUS FISTULA;  Surgeon: Elam Dutch, MD;  Location: [facility];  Service: Vascular;  Laterality: Left;  . REVISON OF ARTERIOVENOUS FISTULA Left 3/38/2505   Procedure:  Plication of Left Upper Arm Fistula ;  Surgeon: Waynetta Sandy, MD;  Location: [facility];  Service: Vascular;  Laterality: Left;  . SKIN GRAFT SPLIT THICKNESS LEG / FOOT Left    SKIN GRAFT SPLIT THICKNESS LEFT ARM DONOR SITE: LEFT ANTERIOR THIGH  . SKIN SPLIT GRAFT Left 07/04/2017   Procedure: SKIN GRAFT SPLIT THICKNESS LEFT ARM DONOR SITE: LEFT ANTERIOR THIGH;  Surgeon: Elam Dutch, MD;  Location: [facility];  Service: Vascular;  Laterality: Left;  . THROMBECTOMY W/ EMBOLECTOMY Left 06/05/2017   Procedure: EXPLORATION OF LEFT ARM FOR BLEEDING; OVERSEWED PROXIMAL FISTULA;  Surgeon: Angelia Mould, MD;  Location: [facility];  Service: Vascular;  Laterality: Left;  . WOUND EXPLORATION Left 06/03/2017   Procedure: WOUND EXPLORATION WITH WOUND VAC APPLICATION TO LEFT ARM;  Surgeon: Angelia Mould, MD;  Location: [facility];  Service: Vascular;  Laterality: Left;        Home Medications    Prior to Admission medications   Medication Sig Start Date End Date Taking? Authorizing Provider  albuterol (VENTOLIN HFA) 108 (90 Base) MCG/ACT inhaler Inhale 2 puffs into the lungs every 6 (six) hours as needed for wheezing or shortness of breath.    [provider]  hydrALAZINE (APRESOLINE) 100 MG tablet Take 0.5 tablets (50 mg total) by mouth 3 (three) times daily. 05/31/19   [place], Carley D, DO  lactulose (CHRONULAC) 10 GM/15ML solution TAKE 45 MLS BY MOUTH 2 TIMES DAILY. Patient not taking: Reported on 05/29/2019 05/25/19   Pyrtle, Lajuan Lines, MD  metoprolol tartrate (LOPRESSOR) 50 MG tablet Take 1 tablet (50 mg total) by mouth 2 (two) times daily. 05/31/19 06/30/19  [place], Carley D, DO  montelukast (SINGULAIR) 10 MG tablet Take 10 mg by mouth every evening.     [provider]  ondansetron (ZOFRAN ODT) 4 MG disintegrating tablet Take 1 tablet (4 mg total) by mouth every 8 (eight) hours as needed for nausea or vomiting. 05/07/19   Fredia Sorrow, MD  pantoprazole  (PROTONIX) 40 MG tablet Take 1 tablet (40 mg total) by mouth daily. 03/22/19   Zehr, Laban Emperor, PA-C  sevelamer carbonate (RENVELA) 800 MG tablet Take 4 tablets with meals  And 2 tablets twice daily with snacks Patient taking differently: Take 1,600 mg by mouth See admin instructions. Take 2 tablets (1600mg ) with meals & with snacks 01/22/18   Patrecia Pour, MD  sulfamethoxazole-trimethoprim (BACTRIM DS) 800-160 MG tablet Take 1 tablet by mouth daily. Patient taking differently: Take 1 tablet by mouth 2 (two) times daily.  12/30/18   Geradine Girt, DO  SYMBICORT 160-4.5 MCG/ACT inhaler Inhale 2 puffs into the lungs 2 (two) times daily.  11/30/18   [provider]  zolpidem (AMBIEN) 10 MG tablet Take 10 mg by mouth at bedtime as needed for sleep.    [provider]    Family History Family History  Problem Relation Age of Onset  . Heart disease Mother   . Lung cancer Mother   . Heart disease Father   . Malignant hyperthermia Father   . COPD Father   . Throat cancer Sister   . Esophageal cancer Sister   . Hypertension Other   . COPD Other   . Colon cancer Neg Hx   . Colon polyps Neg Hx   . Rectal cancer Neg Hx   . Stomach cancer Neg Hx     Social History Social History   Tobacco Use  . Smoking status: Current Every Day Smoker    Packs/day: 0.50    Years: 43.00    Pack years: 21.50    Types: Cigarettes    Start date: 08/13/1973  . Smokeless tobacco: Never Used  . Tobacco comment: i dont know i just make   Substance Use Topics  . Alcohol use: Not Currently    Frequency: Never    Comment: quit drinking in 2017  . Drug use: Yes    Types: Marijuana, Cocaine    Comment: reports using once every 3 months, 04-06-2019 was this      Allergies   Morphine and related, Aspirin, Clonidine derivatives, Tramadol, and Tylenol [acetaminophen]   Review of Systems Review  of Systems  Constitutional: Positive for fatigue. Negative for chills, diaphoresis and fever.  HENT:  Negative for congestion.   Respiratory: Positive for cough and shortness of breath. Negative for chest tightness and wheezing.   Cardiovascular: Positive for chest pain and palpitations. Negative for leg swelling and near-syncope.  Gastrointestinal: Negative for abdominal pain, constipation, diarrhea, nausea and vomiting.  Genitourinary: Negative for flank pain.  Musculoskeletal: Negative for back pain, neck pain and neck stiffness.  Skin: Negative for rash and wound.  Neurological: Negative for dizziness, weakness, light-headedness and headaches.  Psychiatric/Behavioral: Negative for agitation.  All other systems reviewed and are negative.    Physical Exam Updated Vital Signs BP (!) 120/94   Pulse (!) 105   Temp 97.6 F (36.4 C) (Oral)   Resp 17   SpO2 99%   Physical Exam Vitals signs and nursing note reviewed.  Constitutional:      General: He is not in acute distress.    Appearance: He is well-developed. He is not ill-appearing, toxic-appearing or diaphoretic.  HENT:     Head: Normocephalic and atraumatic.  Eyes:     Conjunctiva/sclera: Conjunctivae normal.     Pupils: Pupils are equal, round, and reactive to light.  Neck:     Musculoskeletal: Normal range of motion and neck supple.  Cardiovascular:     Rate and Rhythm: Tachycardia present. Rhythm irregular.     Heart sounds: No murmur. No systolic murmur.  Pulmonary:     Effort: Pulmonary effort is normal. Tachypnea present. No respiratory distress.     Breath sounds: Normal breath sounds. No decreased breath sounds, wheezing, rhonchi or rales.  Chest:     Chest wall: No tenderness.  Abdominal:     Palpations: Abdomen is soft.     Tenderness: There is no abdominal tenderness.  Musculoskeletal:     Right lower leg: He exhibits no tenderness. No edema.     Left lower leg: He exhibits no tenderness. No edema.  Skin:    General: Skin is warm and dry.     Capillary Refill: Capillary refill takes less than 2 seconds.   Neurological:     General: No focal deficit present.     Mental Status: He is alert.  Psychiatric:        Mood and Affect: Mood normal.      ED Treatments / Results  Labs (all labs ordered are listed, but only abnormal results are displayed) Labs Reviewed  CBC WITH DIFFERENTIAL/PLATELET - Abnormal; Notable for the following components:      Result Value   RBC 3.58 (*)    Hemoglobin 9.9 (*)    HCT 27.6 (*)    MCV 77.1 (*)    All other components within normal limits  COMPREHENSIVE METABOLIC PANEL - Abnormal; Notable for the following components:   Glucose, Bld 120 (*)    BUN 26 (*)    Creatinine, Ser 5.96 (*)    AST 48 (*)    GFR calc non Af Amer 10 (*)    GFR calc Af Amer 11 (*)    All other components within normal limits  I-STAT CHEM 8, ED - Abnormal; Notable for the following components:   BUN 28 (*)    Creatinine, Ser 6.00 (*)    Glucose, Bld 118 (*)    Hemoglobin 10.9 (*)    HCT 32.0 (*)    All other components within normal limits  TROPONIN I (HIGH SENSITIVITY) - Abnormal; Notable for the following components:  Troponin I (High Sensitivity) 132 (*)    All other components within normal limits  TROPONIN I (HIGH SENSITIVITY) - Abnormal; Notable for the following components:   Troponin I (High Sensitivity) 138 (*)    All other components within normal limits  SARS CORONAVIRUS 2 (TAT 6-24 HRS)  LIPASE, BLOOD  MAGNESIUM  POC SARS CORONAVIRUS 2 AG -  ED    EKG EKG Interpretation  Date/Time:  Saturday June 02 2019 11:46:55 EST Ventricular Rate:  103 PR Interval:    QRS Duration: 167 QT Interval:  416 QTC Calculation: 545 R Axis:   -108 Text Interpretation: Atrial flutter Nonspecific IVCD with LAD Consider left ventricular hypertrophy Abnormal T, probable ischemia, lateral leads When compared to prior, sharper t waves. No STEMI Confirmed by Antony Blackbird [phone]) on 06/02/2019 11:56:49 AM   Radiology Dg Chest Portable 1 View  Result Date: 06/02/2019  CLINICAL DATA:  Chest pain, shortness of breath EXAM: PORTABLE CHEST 1 VIEW COMPARISON:  06/01/2019 FINDINGS: Stable cardiomegaly. Persistent pulmonary vascular congestion with interstitial edema. Previously seen developing opacity in the left mid lung is less conspicuous compared to prior exam. No new areas of focal airspace consolidation. No pleural effusion. No pneumothorax. IMPRESSION: 1. Stable cardiomegaly with pulmonary vascular congestion and interstitial edema. 2. Previously seen developing opacity in the left mid lung is less conspicuous compared to prior exam. Electronically Signed   By: Davina Poke M.D.   On: 06/02/2019 14:21   Dg Chest Portable 1 View  Result Date: 06/01/2019 CLINICAL DATA:  Shortness of breath. EXAM: PORTABLE CHEST 1 VIEW COMPARISON:  05/29/2019 CT 05/07/2019 FINDINGS: Unchanged cardiomegaly. Worsening pulmonary edema from prior exam. Question of developing more focal airspace opacity in the left mid lung. Skin folds project over the right hemithorax. No pneumothorax. No significant pleural fluid. Aortic atherosclerosis. IMPRESSION: 1. Worsening pulmonary edema from radiographs 3 days ago. 2. Question of developing more focal airspace opacity in the left mid lung which may represent vascular confluence or infection. 3. Unchanged cardiomegaly. 4.  Aortic Atherosclerosis (ICD10-I70.0). Electronically Signed   By: Keith Rake M.D.   On: 06/01/2019 06:36    Procedures Procedures (including critical care time)  CRITICAL CARE Performed by: Gwenyth Allegra Tegeler Total critical care time: 35 minutes Critical care time was exclusive of separately billable procedures and treating other patients. Critical care was necessary to treat or prevent imminent or life-threatening deterioration. Critical care was time spent personally by me on the following activities: development of treatment plan with patient and/or surrogate as well as nursing, discussions with consultants,  evaluation of patient's response to treatment, examination of patient, obtaining history from patient or surrogate, ordering and performing treatments and interventions, ordering and review of laboratory studies, ordering and review of radiographic studies, pulse oximetry and re-evaluation of patient's condition.   Medications Ordered in ED Medications  diltiazem (CARDIZEM) 125 mg in dextrose 5% 125 mL (1 mg/mL) infusion (5 mg/hr Intravenous Transfusing/Transfer 06/02/19 1605)  sulfamethoxazole-trimethoprim (BACTRIM DS) 800-160 MG per tablet 1 tablet (has no administration in time range)  zolpidem (AMBIEN) tablet 10 mg (has no administration in time range)  lactulose (CHRONULAC) 10 GM/15ML solution 30 g (has no administration in time range)  ondansetron (ZOFRAN-ODT) disintegrating tablet 4 mg (has no administration in time range)  pantoprazole (PROTONIX) EC tablet 40 mg (has no administration in time range)  sevelamer carbonate (RENVELA) tablet 1,600 mg (has no administration in time range)  albuterol (PROVENTIL) (2.5 MG/3ML) 0.083% nebulizer solution 3 mL (has no administration  in time range)  montelukast (SINGULAIR) tablet 10 mg (has no administration in time range)  mometasone-formoterol (DULERA) 200-5 MCG/ACT inhaler 2 puff (has no administration in time range)  hydrOXYzine (ATARAX/VISTARIL) tablet 25 mg (has no administration in time range)  heparin injection 5,000 Units (has no administration in time range)     Initial Impression / Assessment and Plan / ED Course  I have reviewed the triage vital signs and the nursing notes.  Pertinent labs & imaging results that were available during my care of the patient were reviewed by me and considered in my medical decision making (see chart for details).        [name] is a 58 y.o. male with a past medical history significant for ESRD on dialysis MWF, chronic hepatitis/cirrhosis, COPD on home oxygen, hypertension, CAD status  post PCI, recurrent A. fib with RVR and polysubstance abuse who presents with fatigue, chest pain, palpitations, shortness of breath.  Patient reports that he was discharged hospital 2 days ago after admission for A. fib with RVR.  He reports he came yesterday but converted back to sinus rhythm after being in A. fib with RVR.  He reports that today he again had an episode of A. fib feeling intense and painful palpitations with associated shortness of breath.  He reports his chest discomfort is burning and crushing it is worsened with exertion.  He reports he is having shortness of breath with it.  He does report a productive cough but denies fevers or chills.  He reports fatigue.  He reports some nausea but no vomiting.  He reports that he needs medicine to "slow him down" and get him feeling better.  He reports that he took 2 metoprolol when he started it this morning but it has not seemed to help.  He reports no recent trauma.  No other medication changes by report.  Patient reports that he had nausea yesterday during dialysis and only had about half of his normal 4-hour treatment.  On exam, patient is having a rapid pulse with irregularity.  Telemetry and EKG shows A. fib with RVR with rate between 110-130s.  Patient's lungs have some crackles in the bases but no significant wheezing or rhonchi.  Abdomen nontender.  Legs have minimal edema.  EKG shows sharp T waves from prior and A. fib with RVR with a rate just over 100.  No STEMI seen.  Clinically I am can concerned about electrolyte imbalance causing the sharp T waves and his recurrent A. fib with RVR.  Due to the fast rate, will start diltiazem again as well as check electrolytes.  Will make sure he does not need emergent dialysis.  Will hold on fluids given the crackles in his lungs on exam and the partial dialysis yesterday.  Anticipate admission after work-up  Potassium was found to be normal despite sharp T waves on EKG.  Troponin is more  elevated than last time.  Initial rapid Covid test was negative.  Heart rate improved on diltiazem but remained in A. fib.  Given the patient's recurrent A. fib for the last 2 days and recent discharge, patient will be readmitted to internal medicine service for further management.  Chest x-ray shows stable cardiomegaly with vascular congestion and edema.  We held on fluids initially and will defer to admitting team for further fluid hydration.  Patient admitted for further management   Final Clinical Impressions(s) / ED Diagnoses   Final diagnoses:  Atrial fibrillation with RVR ([place])  Precordial pain  Shortness of breath    Clinical Impression: 1. Atrial fibrillation with RVR ([place])   2. Precordial pain   3. Shortness of breath     Disposition: Admit  This note was prepared with assistance of Dragon voice recognition software. Occasional wrong-word or sound-a-like substitutions may have occurred due to the inherent limitations of voice recognition software.     Tegeler, Gwenyth Allegra, MD 06/02/19 [phone]

## 2019-06-02 NOTE — ED Triage Notes (Signed)
Patient complains of chest tightness and shortness of breath that started at around 1045 today. MWF dialysis, right arm restricted, last full treatment earlier in the week, did not complete treatment yesterday because he said he started to feel nauseous. Patient alert and oriented at this time.

## 2019-06-02 NOTE — ED Notes (Signed)
Attempted to call report x 1  

## 2019-06-03 DIAGNOSIS — I4891 Unspecified atrial fibrillation: Secondary | ICD-10-CM | POA: Diagnosis not present

## 2019-06-03 DIAGNOSIS — N2581 Secondary hyperparathyroidism of renal origin: Secondary | ICD-10-CM | POA: Diagnosis present

## 2019-06-03 DIAGNOSIS — D631 Anemia in chronic kidney disease: Secondary | ICD-10-CM | POA: Diagnosis not present

## 2019-06-03 DIAGNOSIS — D649 Anemia, unspecified: Secondary | ICD-10-CM | POA: Diagnosis present

## 2019-06-03 DIAGNOSIS — R188 Other ascites: Secondary | ICD-10-CM

## 2019-06-03 DIAGNOSIS — I2510 Atherosclerotic heart disease of native coronary artery without angina pectoris: Secondary | ICD-10-CM | POA: Diagnosis present

## 2019-06-03 DIAGNOSIS — R011 Cardiac murmur, unspecified: Secondary | ICD-10-CM

## 2019-06-03 DIAGNOSIS — Z20828 Contact with and (suspected) exposure to other viral communicable diseases: Secondary | ICD-10-CM | POA: Diagnosis present

## 2019-06-03 DIAGNOSIS — N186 End stage renal disease: Secondary | ICD-10-CM | POA: Diagnosis present

## 2019-06-03 DIAGNOSIS — I480 Paroxysmal atrial fibrillation: Secondary | ICD-10-CM

## 2019-06-03 DIAGNOSIS — I252 Old myocardial infarction: Secondary | ICD-10-CM | POA: Diagnosis not present

## 2019-06-03 DIAGNOSIS — K7460 Unspecified cirrhosis of liver: Secondary | ICD-10-CM

## 2019-06-03 DIAGNOSIS — I700 Atherosclerosis of aorta: Secondary | ICD-10-CM | POA: Diagnosis present

## 2019-06-03 DIAGNOSIS — Z9119 Patient's noncompliance with other medical treatment and regimen: Secondary | ICD-10-CM | POA: Diagnosis not present

## 2019-06-03 DIAGNOSIS — I4819 Other persistent atrial fibrillation: Secondary | ICD-10-CM | POA: Diagnosis present

## 2019-06-03 DIAGNOSIS — B181 Chronic viral hepatitis B without delta-agent: Secondary | ICD-10-CM | POA: Diagnosis present

## 2019-06-03 DIAGNOSIS — Z9981 Dependence on supplemental oxygen: Secondary | ICD-10-CM | POA: Diagnosis not present

## 2019-06-03 DIAGNOSIS — R001 Bradycardia, unspecified: Secondary | ICD-10-CM | POA: Diagnosis not present

## 2019-06-03 DIAGNOSIS — J449 Chronic obstructive pulmonary disease, unspecified: Secondary | ICD-10-CM | POA: Diagnosis present

## 2019-06-03 DIAGNOSIS — I120 Hypertensive chronic kidney disease with stage 5 chronic kidney disease or end stage renal disease: Secondary | ICD-10-CM | POA: Diagnosis not present

## 2019-06-03 DIAGNOSIS — Z955 Presence of coronary angioplasty implant and graft: Secondary | ICD-10-CM

## 2019-06-03 DIAGNOSIS — Z886 Allergy status to analgesic agent status: Secondary | ICD-10-CM | POA: Diagnosis not present

## 2019-06-03 DIAGNOSIS — Z8719 Personal history of other diseases of the digestive system: Secondary | ICD-10-CM

## 2019-06-03 DIAGNOSIS — Z992 Dependence on renal dialysis: Secondary | ICD-10-CM

## 2019-06-03 DIAGNOSIS — F17210 Nicotine dependence, cigarettes, uncomplicated: Secondary | ICD-10-CM | POA: Diagnosis present

## 2019-06-03 DIAGNOSIS — Z9115 Patient's noncompliance with renal dialysis: Secondary | ICD-10-CM | POA: Diagnosis not present

## 2019-06-03 DIAGNOSIS — B182 Chronic viral hepatitis C: Secondary | ICD-10-CM | POA: Diagnosis present

## 2019-06-03 DIAGNOSIS — M19012 Primary osteoarthritis, left shoulder: Secondary | ICD-10-CM | POA: Diagnosis present

## 2019-06-03 DIAGNOSIS — I050 Rheumatic mitral stenosis: Secondary | ICD-10-CM | POA: Diagnosis present

## 2019-06-03 DIAGNOSIS — I483 Typical atrial flutter: Secondary | ICD-10-CM | POA: Diagnosis not present

## 2019-06-03 DIAGNOSIS — E8770 Fluid overload, unspecified: Secondary | ICD-10-CM | POA: Diagnosis not present

## 2019-06-03 DIAGNOSIS — I1311 Hypertensive heart and chronic kidney disease without heart failure, with stage 5 chronic kidney disease, or end stage renal disease: Secondary | ICD-10-CM | POA: Diagnosis present

## 2019-06-03 DIAGNOSIS — I4892 Unspecified atrial flutter: Secondary | ICD-10-CM | POA: Diagnosis present

## 2019-06-03 DIAGNOSIS — I447 Left bundle-branch block, unspecified: Secondary | ICD-10-CM

## 2019-06-03 DIAGNOSIS — R0602 Shortness of breath: Secondary | ICD-10-CM | POA: Diagnosis not present

## 2019-06-03 LAB — RENAL FUNCTION PANEL
Albumin: 3.1 g/dL — ABNORMAL LOW (ref 3.5–5.0)
Anion gap: 11 (ref 5–15)
BUN: 34 mg/dL — ABNORMAL HIGH (ref 6–20)
CO2: 29 mmol/L (ref 22–32)
Calcium: 9.6 mg/dL (ref 8.9–10.3)
Chloride: 96 mmol/L — ABNORMAL LOW (ref 98–111)
Creatinine, Ser: 6.99 mg/dL — ABNORMAL HIGH (ref 0.61–1.24)
GFR calc Af Amer: 9 mL/min — ABNORMAL LOW (ref >60)
GFR calc non Af Amer: 8 mL/min — ABNORMAL LOW (ref >60)
Glucose, Bld: 99 mg/dL (ref 70–99)
Phosphorus: 2.9 mg/dL (ref 2.5–4.6)
Potassium: 4.9 mmol/L (ref 3.5–5.1)
Sodium: 136 mmol/L (ref 135–145)

## 2019-06-03 MED ORDER — HYDROXYZINE HCL 25 MG PO TABS
ORAL_TABLET | ORAL | Status: AC
  Administered 2019-06-03: 25 mg via ORAL
  Filled 2019-06-03: qty 1

## 2019-06-03 MED ORDER — RAMELTEON 8 MG PO TABS
8.0000 mg | ORAL_TABLET | Freq: Every day | ORAL | Status: DC
  Administered 2019-06-03: 8 mg via ORAL
  Filled 2019-06-03 (×2): qty 1

## 2019-06-03 MED ORDER — DARBEPOETIN ALFA 25 MCG/0.42ML IJ SOSY
PREFILLED_SYRINGE | INTRAMUSCULAR | Status: AC
  Filled 2019-06-03: qty 0.42

## 2019-06-03 MED ORDER — METOPROLOL TARTRATE 100 MG PO TABS
100.0000 mg | ORAL_TABLET | Freq: Two times a day (BID) | ORAL | Status: DC
  Administered 2019-06-03: 100 mg via ORAL
  Filled 2019-06-03: qty 1

## 2019-06-03 MED ORDER — CALCITRIOL 0.25 MCG PO CAPS
2.5000 ug | ORAL_CAPSULE | Freq: Once | ORAL | Status: AC
  Administered 2019-06-03: 2.5 ug via ORAL
  Filled 2019-06-03: qty 10

## 2019-06-03 MED ORDER — DARBEPOETIN ALFA 25 MCG/0.42ML IJ SOSY
25.0000 ug | PREFILLED_SYRINGE | Freq: Once | INTRAMUSCULAR | Status: AC
  Administered 2019-06-03: 25 ug via INTRAVENOUS
  Filled 2019-06-03: qty 0.42

## 2019-06-03 MED ORDER — CHLORHEXIDINE GLUCONATE CLOTH 2 % EX PADS
6.0000 | MEDICATED_PAD | Freq: Every day | CUTANEOUS | Status: DC
  Administered 2019-06-03 – 2019-06-04 (×2): 6 via TOPICAL

## 2019-06-03 MED ORDER — METOPROLOL TARTRATE 100 MG PO TABS
100.0000 mg | ORAL_TABLET | Freq: Two times a day (BID) | ORAL | Status: DC
  Administered 2019-06-04: 100 mg via ORAL
  Filled 2019-06-03: qty 1

## 2019-06-03 MED ORDER — HYDROXYZINE HCL 25 MG PO TABS
25.0000 mg | ORAL_TABLET | Freq: Once | ORAL | Status: AC
  Administered 2019-06-03: 15:00:00 25 mg via ORAL

## 2019-06-03 MED ORDER — ACETAMINOPHEN 325 MG PO TABS
650.0000 mg | ORAL_TABLET | Freq: Four times a day (QID) | ORAL | Status: DC | PRN
  Administered 2019-06-03: 650 mg via ORAL
  Filled 2019-06-03: qty 2

## 2019-06-03 NOTE — Progress Notes (Signed)
IMTS Alexander made aware of Pt refusing sq heparin. Pt educated on purpose of VTE and he continues to refuses.

## 2019-06-03 NOTE — Progress Notes (Signed)
  Date: 06/03/2019  Patient name: [name]  Medical record number: [id number]  Date of birth: October 17, 1960   I have seen and evaluated [name] and discussed their care with the Residency Team.  In brief, patient is a 58 year old male with a past medical history of decompensated cirrhosis with recurrent ascites and history of SBP, ESRD on hemodialysis, paroxysmal atrial fibrillation, COPD, hypertension and CAD status post stent placement in 2018 with recent admission for recurrent A. fib who presented to the ED with palpitations and shortness of breath.  Patient was recently admitted to the hospital from November 17 to November 19 for A. fib/flutter with RVR.  At that time patient was rate controlled with IV diltiazem and then transitioned to oral metoprolol 50 mg twice daily which controlled his heart rate.  Patient states that while he was at home he developed recurrent palpitations and shortness of breath and took an extra dose of his home metoprolol which did not resolve his symptoms.  He came to the ED for further evaluation and was found to be in A. fib/flutter with RVR and was started on diltiazem drip.  Today patient states that his symptoms have resolved.  His heart rate is well controlled on current regimen.  PMHx, Fam Hx, and/or Soc Hx : As per resident note  Vitals:   06/03/19 0926 06/03/19 0956  BP: 112/70   Pulse: 63 71  Resp: 16   Temp: 98.5 F (36.9 C)   SpO2: 98%    General: Awake, alert, oriented x3, NAD CVS: Irregularly irregular, normal heart sounds Lungs: CTA bilaterally Abdomen: Soft, nontender, nondistended, normoactive bowel sounds Extremities: No edema noted, nontender to palpation Skin: Warm and dry Psych: Normal mood and affect Neuro: Oriented x3, no focal deficit noted  Assessment and Plan: I have seen and evaluated the patient as outlined above. I agree with the formulated Assessment and Plan as detailed in the residents' note, with  the following changes:   1.  A. fib/flutter with RVR: -Patient presented to the ED with recurrent palpitations and shortness of breath and was found to be in A. fib/flutter with RVR. -EKG done today showed a flutter with RVR but rate controlled as well as a left bundle branch block -Patient's heart rate is well controlled currently with diltiazem drip.  Will transition patient off diltiazem drip to metoprolol 100 mg twice daily -If patient has recurrent tachycardia he may need follow-up with cardiology as well as a possible flutter ablation -Continue with hemodialysis per nephrology -We will monitor the patient for an additional 24 hours given recurrent episodes of tachycardia -Patient is not on anticoagulation at this time secondary to increased bleeding risk -No further work-up at this time  Aldine Contes, MD 11/22/202012:00 PM

## 2019-06-03 NOTE — Procedures (Signed)
Patient seen on Hemodialysis. BP 110/90 (BP Location: Right Leg)   Pulse (!) 58   Temp 98.5 F (36.9 C) (Oral)   Resp 17   SpO2 100%    Qb 400 mL/ min via R UE AVG UF goal 3L.  Tolerating rx well, no complaints.  Eating graham crackers.  Encouraged to stay full rx.   Madelon Lips MD 3:46 PM

## 2019-06-03 NOTE — Progress Notes (Addendum)
   Subjective:  Overnight he had a headache and was given Tylenol 650mg .  Otherwise no acute events overnight.  Has continued to be rate-controlled on IV diltiazem.  Denies chest pain, SOB or palpitations currently.  Objective:  Vital signs in last 24 hours: Vitals:   06/03/19 0541 06/03/19 0824 06/03/19 0926 06/03/19 0956  BP:   112/70   Pulse:   63 71  Resp:   16   Temp: 98 F (36.7 C)  98.5 F (36.9 C)   TempSrc: Oral  Oral   SpO2:  97% 98%    Weight change:   Intake/Output Summary (Last 24 hours) at 06/03/2019 1107 Last data filed at 06/03/2019 0400 Gross per 24 hour  Intake 75.86 ml  Output -  Net 75.86 ml    General: awake, alert, lying comfortably in bed in NAD Pulm: lungs clear to auscultation bilaterally, normal work of breathing on room air CV: irregular rhythm, 2/6 holosystolic murmur GI: abdomen mildly distended, normal bowel sounds MSK: no peripheral edema Neuro: A&Ox3; no focal deficits Skin: warm and dry Psych: normal mood and affect    Assessment/Plan:  Active Problems:   Atrial fibrillation with RVR ([place])   58 y.o. male with past medical history of decompensated cirrhosis with recurrent ascites and history of SBP, ESRD on HD, paroxysmal atrial fibrillation, COPD, HTN, and CAD status post stent 02/2017 who presented on 11/21 for short readmission for recurrent Afib/Aflutter with RVR with associated chest pain and SOB.  Afib/Aflutter: Now rate-controlled on IV diltiazem.   - Transition to po metoprolol at higher dose of 100mg  BID (up from 50mg  BID) - On discharge will restart hydralazine at lower dose of 25mg  TID (down from 50mg  TID) to prevent hypotension - Given his multiple recent ED visits and short readmission for refractory arrhythmia, we will continue to monitor on telemetry until tomorrow to ensure he continues to be rate controlled prior to discharge home - May need to see Cardiology and consider ablation if RVR continues to be refractory to  increased doses of metoprolol, especially with EKG more consistent with atrial flutter which can be difficult to rate control pharmaceutically   ESRD on HD M/W/F - Had half HD session on Friday due to tachycardia - Nephrology consulted to have HD today  Hx of Cirrhosis  - Continued on SBP ppx, lactulose; no evidence of acute decompensation  Dispo: Anticipated discharge tomorrow   LOS: 0 days   Tonia Ghent, Medical Student  06/03/2019, 11:07 AM

## 2019-06-03 NOTE — Consult Note (Signed)
[facility] Renal Consultation Note    Indication for Consultation:  Management of ESRD/hemodialysis, anemia, hypertension/volume, and secondary hyperparathyroidism. PCP:  HPI: [name] is a 59 y.o. male with ESRD, HBV, COPD, HTN, A-fib, Hx substance abuse, and CAD (stents) who was admitted with CP and found to be in atrial flutter.   He has been in the ED on multiple occasions and admitted twice in the past month with the same complaints. Gets symptomatic with CP and dyspnea in setting of A-fib. Last discharged on 11/19, then back in ED on 11/20 with A-fib which resolved with 1 time dose amiodarone.  On 11/21 he reports that he was sitting and talking when symptoms recurred. CP was burning in nature. No N/V or fever. Took home metoprolol without relief. He presented to ED again. Labs ok but EKG showed persistent A-flutter with abnormal appearing T waves. He was started on diltiazem drip and admitted.   Today he was seen in his room. Says he is hungry. Denies CP or dyspnea at the moment. Plan is to restart PO meds this morning.  Dialyzes on MWF schedule at [facility] - last was 11/20 although he cut his time short. He is due for HD today per holiday schedule (Sun/Tues/Fri this week).  Past Medical History:  Diagnosis Date  . Anemia   . Anxiety   . Arthritis    left shoulder  . Atherosclerosis of aorta ([place])   . Cardiomegaly   . Chest pain    DATE UNKNOWN, C/O PERIODICALLY  . Cocaine abuse ([place])   . COPD exacerbation ([place]) 08/17/2016  . Coronary artery disease    stent 02/22/17  . ESRD (end stage renal disease) on dialysis ([place])    "[name]; MWF" (07/04/2017)  . GERD (gastroesophageal reflux disease)    DATE UNKNOWN  . Hemorrhoids   . Hepatitis B, chronic ([place])   . Hepatitis C   . History of kidney stones   . Hyperkalemia   . Hypertension   . Kidney failure   . Metabolic bone disease    Patient denies  . Mitral stenosis   . Myocardial  infarction ([place])   . Pneumonia   . Pulmonary edema   . Solitary rectal ulcer syndrome 07/2017   at flex sig for rectal bleeding  . Tubular adenoma of colon    Past Surgical History:  Procedure Laterality Date  . A/V FISTULAGRAM Left 05/26/2017   Procedure: A/V FISTULAGRAM;  Surgeon: Conrad [place]ville, MD;  Location: [place] CV LAB;  Service: Cardiovascular;  Laterality: Left;  . A/V FISTULAGRAM Right 11/18/2017   Procedure: A/V FISTULAGRAM - Right Arm;  Surgeon: Elam Dutch, MD;  Location: [place] [facility];  Service: Cardiovascular;  Laterality: Right;  . APPLICATION OF WOUND VAC Left 06/14/2017   Procedure: APPLICATION OF WOUND VAC;  Surgeon: Katha Cabal, MD;  Location: [facility] ORS;  Service: Vascular;  Laterality: Left;  . AV FISTULA PLACEMENT  2012   BELIEVED WAS PLACED IN JUNE  . AV FISTULA PLACEMENT Right 08/09/2017   Procedure: Creation Right arm ARTERIOVENOUS BRACHIOCEPOHALIC FISTULA;  Surgeon: Elam Dutch, MD;  Location: [facility] OR;  Service: Vascular;  Laterality: Right;  . AV FISTULA PLACEMENT Right 11/22/2017   Procedure: INSERTION OF ARTERIOVENOUS (AV) GORE-TEX GRAFT RIGHT UPPER ARM;  Surgeon: Elam Dutch, MD;  Location: [place];  Service: Vascular;  Laterality: Right;  . BIOPSY  01/25/2018   Procedure: BIOPSY;  Surgeon: Jerene Bears, MD;  Location: [facility];  Service: Gastroenterology;;  . BIOPSY  04/10/2019   Procedure: BIOPSY;  Surgeon: Jerene Bears, MD;  Location: [facility];  Service: Gastroenterology;;  . COLONOSCOPY    . COLONOSCOPY WITH PROPOFOL N/A 01/25/2018   Procedure: COLONOSCOPY WITH PROPOFOL;  Surgeon: Jerene Bears, MD;  Location: [place];  Service: Gastroenterology;  Laterality: N/A;  . CORONARY STENT INTERVENTION N/A 02/22/2017   Procedure: CORONARY STENT INTERVENTION;  Surgeon: Nigel Mormon, MD;  Location: [place] [facility];  Service: Cardiovascular;  Laterality: N/A;  . ESOPHAGOGASTRODUODENOSCOPY (EGD) WITH PROPOFOL N/A  01/25/2018   Procedure: ESOPHAGOGASTRODUODENOSCOPY (EGD) WITH PROPOFOL;  Surgeon: Jerene Bears, MD;  Location: [place];  Service: Gastroenterology;  Laterality: N/A;  . ESOPHAGOGASTRODUODENOSCOPY (EGD) WITH PROPOFOL N/A 04/10/2019   Procedure: ESOPHAGOGASTRODUODENOSCOPY (EGD) WITH PROPOFOL;  Surgeon: Jerene Bears, MD;  Location: [facility];  Service: Gastroenterology;  Laterality: N/A;  . FLEXIBLE SIGMOIDOSCOPY N/A 07/15/2017   Procedure: FLEXIBLE SIGMOIDOSCOPY;  Surgeon: Carol Ada, MD;  Location: [place];  Service: Endoscopy;  Laterality: N/A;  . HEMORRHOID BANDING    . I&D EXTREMITY Left 06/01/2017   Procedure: IRRIGATION AND DEBRIDEMENT LEFT ARM HEMATOMA WITH LIGATION OF LEFT ARM AV FISTULA;  Surgeon: Elam Dutch, MD;  Location: [place];  Service: Vascular;  Laterality: Left;  . I&D EXTREMITY Left 06/14/2017   Procedure: IRRIGATION AND DEBRIDEMENT EXTREMITY;  Surgeon: Katha Cabal, MD;  Location: [facility];  Service: Vascular;  Laterality: Left;  . INSERTION OF DIALYSIS CATHETER  05/30/2017  . INSERTION OF DIALYSIS CATHETER N/A 05/30/2017   Procedure: INSERTION OF DIALYSIS CATHETER;  Surgeon: Elam Dutch, MD;  Location: [place];  Service: Vascular;  Laterality: N/A;  . IR PARACENTESIS  08/30/2017  . IR PARACENTESIS  09/29/2017  . IR PARACENTESIS  10/28/2017  . IR PARACENTESIS  11/09/2017  . IR PARACENTESIS  11/16/2017  . IR PARACENTESIS  11/28/2017  . IR PARACENTESIS  12/01/2017  . IR PARACENTESIS  12/06/2017  . IR PARACENTESIS  01/03/2018  . IR PARACENTESIS  01/23/2018  . IR PARACENTESIS  02/07/2018  . IR PARACENTESIS  02/21/2018  . IR PARACENTESIS  03/06/2018  . IR PARACENTESIS  03/17/2018  . IR PARACENTESIS  04/04/2018  . IR PARACENTESIS  12/28/2018  . IR PARACENTESIS  01/08/2019  . IR PARACENTESIS  01/23/2019  . IR PARACENTESIS  02/01/2019  . IR PARACENTESIS  02/19/2019  . IR PARACENTESIS  03/01/2019  . IR PARACENTESIS  03/15/2019  . IR PARACENTESIS  04/03/2019  . IR  PARACENTESIS  04/12/2019  . IR PARACENTESIS  05/01/2019  . IR PARACENTESIS  05/08/2019  . IR PARACENTESIS  05/24/2019  . IR RADIOLOGIST EVAL & MGMT  02/14/2018  . IR RADIOLOGIST EVAL & MGMT  02/22/2019  . LEFT HEART CATH AND CORONARY ANGIOGRAPHY N/A 02/22/2017   Procedure: LEFT HEART CATH AND CORONARY ANGIOGRAPHY;  Surgeon: Nigel Mormon, MD;  Location: [place] [facility];  Service: Cardiovascular;  Laterality: N/A;  . LIGATION OF ARTERIOVENOUS  FISTULA Left 07/15/4313   Procedure: Plication of Left Arm Arteriovenous Fistula;  Surgeon: Elam Dutch, MD;  Location: [place];  Service: Vascular;  Laterality: Left;  . POLYPECTOMY    . POLYPECTOMY  01/25/2018   Procedure: POLYPECTOMY;  Surgeon: Jerene Bears, MD;  Location: [facility] ENDOSCOPY;  Service: Gastroenterology;;  . REVISON OF ARTERIOVENOUS FISTULA Left 4/00/8676   Procedure: PLICATION OF DISTAL ANEURYSMAL SEGEMENT OF LEFT UPPER ARM ARTERIOVENOUS FISTULA;  Surgeon: Elam Dutch,  MD;  Location: [place];  Service: Vascular;  Laterality: Left;  . REVISON OF ARTERIOVENOUS FISTULA Left 10/21/8784   Procedure: Plication of Left Upper Arm Fistula ;  Surgeon: Waynetta Sandy, MD;  Location: [place];  Service: Vascular;  Laterality: Left;  . SKIN GRAFT SPLIT THICKNESS LEG / FOOT Left    SKIN GRAFT SPLIT THICKNESS LEFT ARM DONOR SITE: LEFT ANTERIOR THIGH  . SKIN SPLIT GRAFT Left 07/04/2017   Procedure: SKIN GRAFT SPLIT THICKNESS LEFT ARM DONOR SITE: LEFT ANTERIOR THIGH;  Surgeon: Elam Dutch, MD;  Location: [place];  Service: Vascular;  Laterality: Left;  . THROMBECTOMY W/ EMBOLECTOMY Left 06/05/2017   Procedure: EXPLORATION OF LEFT ARM FOR BLEEDING; OVERSEWED PROXIMAL FISTULA;  Surgeon: Angelia Mould, MD;  Location: [place];  Service: Vascular;  Laterality: Left;  . WOUND EXPLORATION Left 06/03/2017   Procedure: WOUND EXPLORATION WITH WOUND VAC APPLICATION TO LEFT ARM;  Surgeon: Angelia Mould, MD;  Location: [facility] OR;  Service:  Vascular;  Laterality: Left;   Family History  Problem Relation Age of Onset  . Heart disease Mother   . Lung cancer Mother   . Heart disease Father   . Malignant hyperthermia Father   . COPD Father   . Throat cancer Sister   . Esophageal cancer Sister   . Hypertension Other   . COPD Other   . Colon cancer Neg Hx   . Colon polyps Neg Hx   . Rectal cancer Neg Hx   . Stomach cancer Neg Hx    Social History:  reports that he has been smoking cigarettes. He started smoking about 45 years ago. He has a 21.50 pack-year smoking history. He has never used smokeless tobacco. He reports previous alcohol use. He reports current drug use. Drugs: Marijuana and Cocaine.  ROS: As per HPI otherwise negative.  Physical Exam: Vitals:   06/03/19 0541 06/03/19 0824 06/03/19 0926 06/03/19 0956  BP:   112/70   Pulse:   63 71  Resp:   16   Temp: 98 F (36.7 C)  98.5 F (36.9 C)   TempSrc: Oral  Oral   SpO2:  97% 98%      General: Well developed, well nourished, in no acute distress. Room air. Head: Normocephalic, atraumatic, sclera non-icteric, mucus membranes are moist. Neck: Supple without lymphadenopathy/masses.  Lungs: Clear bilaterally to auscultation without wheezes, rales, or rhonchi. Breathing is unlabored. Heart: RRR with normal S1, S2. No murmurs, rubs, or gallops appreciated. Abdomen: Soft, non-tender. Slightly distended (recurrent ascites) Musculoskeletal:  Strength and tone appear normal for age. Lower extremities: No edema or ischemic changes, no open wounds. Neuro: Alert and oriented X 3. Moves all extremities spontaneously. Psych:  Responds to questions appropriately with a normal affect. Dialysis Access: RUE AVG + bruit  Allergies  Allergen Reactions  . Morphine And Related Other (See Comments)    Stomach pain  . Aspirin Other (See Comments)    STOMACH PAIN  . Clonidine Derivatives Itching  . Tramadol Itching  . Tylenol [Acetaminophen] Nausea Only    Stomach ache    Prior to Admission medications   Medication Sig Start Date End Date Taking? Authorizing Provider  albuterol (VENTOLIN HFA) 108 (90 Base) MCG/ACT inhaler Inhale 2 puffs into the lungs every 6 (six) hours as needed for wheezing or shortness of breath.   Yes [provider]  hydrALAZINE (APRESOLINE) 100 MG tablet Take 0.5 tablets (50 mg total) by mouth 3 (three) times daily. 05/31/19  Yes Bloomfield, Carley D, DO  metoprolol tartrate (LOPRESSOR) 50 MG tablet Take 1 tablet (50 mg total) by mouth 2 (two) times daily. 05/31/19 06/30/19 Yes Bloomfield, Carley D, DO  montelukast (SINGULAIR) 10 MG tablet Take 10 mg by mouth every evening.    Yes [provider]  ondansetron (ZOFRAN ODT) 4 MG disintegrating tablet Take 1 tablet (4 mg total) by mouth every 8 (eight) hours as needed for nausea or vomiting. 05/07/19  Yes Fredia Sorrow, MD  pantoprazole (PROTONIX) 40 MG tablet Take 1 tablet (40 mg total) by mouth daily. 03/22/19  Yes Zehr, Laban Emperor, PA-C  sevelamer carbonate (RENVELA) 800 MG tablet Take 4 tablets with meals  And 2 tablets twice daily with snacks Patient taking differently: Take 1,600 mg by mouth See admin instructions. Take 2 tablets (1600mg ) with meals & with snacks 01/22/18  Yes Patrecia Pour, MD  sulfamethoxazole-trimethoprim (BACTRIM DS) 800-160 MG tablet Take 1 tablet by mouth daily. Patient taking differently: Take 1 tablet by mouth 2 (two) times daily.  12/30/18  Yes Vann, Jessica U, DO  SYMBICORT 160-4.5 MCG/ACT inhaler Inhale 2 puffs into the lungs 2 (two) times daily.  11/30/18  Yes [provider]  zolpidem (AMBIEN) 10 MG tablet Take 10 mg by mouth at bedtime as needed for sleep.   Yes [provider]   Current Facility-Administered Medications  Medication Dose Route Frequency Provider Last Rate Last Dose  . albuterol (PROVENTIL) (2.5 MG/3ML) 0.083% nebulizer solution 3 mL  3 mL Inhalation Q6H PRN Bloomfield, Carley D, DO      . Chlorhexidine  Gluconate Cloth 2 % PADS 6 each  6 each Topical Q0600 Loren Racer, PA-C      . diltiazem (CARDIZEM) 125 mg in dextrose 5% 125 mL (1 mg/mL) infusion  5-15 mg/hr Intravenous Continuous Bloomfield, Carley D, DO 5 mL/hr at 06/03/19 0617 5 mg/hr at 06/03/19 0617  . heparin injection 5,000 Units  5,000 Units Subcutaneous Q8H Bloomfield, Carley D, DO   5,000 Units at 06/02/19 2107  . hydrOXYzine (ATARAX/VISTARIL) tablet 25 mg  25 mg Oral TID PRN Bloomfield, Carley D, DO      . lactulose (CHRONULAC) 10 GM/15ML solution 30 g  30 g Oral BID PRN Bloomfield, Carley D, DO      . metoprolol tartrate (LOPRESSOR) tablet 100 mg  100 mg Oral BID Bloomfield, Carley D, DO   100 mg at 06/03/19 0956  . mometasone-formoterol (DULERA) 200-5 MCG/ACT inhaler 2 puff  2 puff Inhalation BID Bloomfield, Carley D, DO   2 puff at 06/03/19 0823  . montelukast (SINGULAIR) tablet 10 mg  10 mg Oral QPM Bloomfield, Carley D, DO   10 mg at 06/02/19 1840  . ondansetron (ZOFRAN-ODT) disintegrating tablet 4 mg  4 mg Oral Q8H PRN Bloomfield, Carley D, DO      . pantoprazole (PROTONIX) EC tablet 40 mg  40 mg Oral Daily Bloomfield, Carley D, DO   40 mg at 06/03/19 0958  . sevelamer carbonate (RENVELA) tablet 1,600 mg  1,600 mg Oral TID WC Bloomfield, Carley D, DO   1,600 mg at 06/03/19 0956  . sulfamethoxazole-trimethoprim (BACTRIM DS) 800-160 MG per tablet 1 tablet  1 tablet Oral Daily Bloomfield, Carley D, DO   1 tablet at 06/03/19 0956  . zolpidem (AMBIEN) tablet 10 mg  10 mg Oral QHS PRN Modena Nunnery D, DO   10 mg at 06/02/19 2107   Labs: Basic Metabolic Panel: Recent Labs  Lab 05/30/19 [phone]  06/01/19 0506 06/02/19 1207 06/02/19 1215 06/03/19 0800  NA 138   < > 136 139 137 136  K 5.4*   < > 4.5 3.8 3.7 4.9  CL 101   < > 98 99 98 96*  CO2 24   < > 22 29  --  29  GLUCOSE 134*   < > 164* 120* 118* 99  BUN 31*   < > 27* 26* 28* 34*  CREATININE 6.87*   < > 6.72* 5.96* 6.00* 6.99*  CALCIUM 9.3   < > 9.5 9.8  --  9.6   PHOS 3.6  --   --   --   --  2.9   < > = values in this interval not displayed.   Liver Function Tests: Recent Labs  Lab 05/30/19 0412 06/02/19 1207 06/03/19 0800  AST  --  48*  --   ALT  --  28  --   ALKPHOS  --  118  --   BILITOT  --  0.4  --   PROT  --  6.9  --   ALBUMIN 3.1* 3.5 3.1*   Recent Labs  Lab 06/02/19 1207  LIPASE 34   CBC: Recent Labs  Lab 05/29/19 0900 05/29/19 1614 05/29/19 1930 05/30/19 0412 06/01/19 0506 06/02/19 1203 06/02/19 1215  WBC 6.2 8.3 8.5 8.2 7.8 9.6  --   NEUTROABS 4.3  --   --   --   --  7.3  --   HGB 9.6* 10.7* 10.0* 9.1* 9.9* 9.9* 10.9*  HCT 27.2* 30.5* 28.5* 26.1* 28.3* 27.6* 32.0*  MCV 77.3* 78.2* 77.9* 77.9* 77.3* 77.1*  --   PLT 131* 164 142* 134* 160 181  --    Cardiac Enzymes: No results for input(s): CKTOTAL, CKMB, CKMBINDEX, TROPONINI in the last 168 hours. CBG: Recent Labs  Lab 05/27/19 2111 05/28/19 0043 05/28/19 0714 05/28/19 0752 05/28/19 1124  GLUCAP 74 95 63* 94 100*   Studies/Results: Dg Chest Portable 1 View  Result Date: 06/02/2019 CLINICAL DATA:  Chest pain, shortness of breath EXAM: PORTABLE CHEST 1 VIEW COMPARISON:  06/01/2019 FINDINGS: Stable cardiomegaly. Persistent pulmonary vascular congestion with interstitial edema. Previously seen developing opacity in the left mid lung is less conspicuous compared to prior exam. No new areas of focal airspace consolidation. No pleural effusion. No pneumothorax. IMPRESSION: 1. Stable cardiomegaly with pulmonary vascular congestion and interstitial edema. 2. Previously seen developing opacity in the left mid lung is less conspicuous compared to prior exam. Electronically Signed   By: Davina Poke M.D.   On: 06/02/2019 14:21    Dialysis Orders:  MWF at [facility] 450/800, EDW 65kg, 2K/2Ca, AVG, no heparin - Parsabiv 2.5mg  IV q HD - Calcitriol 2.55mcg PO q HD - Mircera 50mg  IV q 2 weeks (ordered, not yet given - last OP dose 76mcg on  10/16)  Assessment/Plan: 1.  A-fib/flutter: Improved on diltiazem - transitioning back to ^ dose PO metoprolol. 2.  ESRD: Usual MWF schedule - due today per holiday sched (Su/Tu/Fri) - will dialyze today. 3.  Hypertension/volume: BP stable - CXR with pulm edema and his face looks a little puffy today - 2.5L UF as tolerated - possible more if tolerates. 4.  Anemia: Hgb 9.9 - will give Aranesp 63mcg today. 5.  Metabolic bone disease: Ca/Phos ok. Continue home binders/VDRA. Parsabiv not option here. 6.  HBV 7.  COPD 8.  CAD 9. Dispo: Per notes, plan to observe another 24hr to make sure rhythm/rate stable.  Veneta Penton, PA-C 06/03/2019, 11:25 AM  [facility] Pager: [phone]

## 2019-06-04 ENCOUNTER — Encounter (HOSPITAL_COMMUNITY): Payer: Self-pay | Admitting: General Practice

## 2019-06-04 DIAGNOSIS — Z885 Allergy status to narcotic agent status: Secondary | ICD-10-CM

## 2019-06-04 DIAGNOSIS — I483 Typical atrial flutter: Secondary | ICD-10-CM

## 2019-06-04 DIAGNOSIS — Z886 Allergy status to analgesic agent status: Secondary | ICD-10-CM

## 2019-06-04 DIAGNOSIS — Z888 Allergy status to other drugs, medicaments and biological substances status: Secondary | ICD-10-CM

## 2019-06-04 DIAGNOSIS — I4892 Unspecified atrial flutter: Secondary | ICD-10-CM

## 2019-06-04 MED ORDER — OXYCODONE HCL 5 MG PO TABS
5.0000 mg | ORAL_TABLET | ORAL | Status: DC | PRN
  Administered 2019-06-04: 5 mg via ORAL
  Filled 2019-06-04: qty 1

## 2019-06-04 MED ORDER — METOPROLOL TARTRATE 100 MG PO TABS: 100.0000 mg | ORAL_TABLET | Freq: Two times a day (BID) | ORAL | 0 refills | Status: DC

## 2019-06-04 MED ORDER — LACTULOSE 10 GM/15ML PO SOLN: ORAL | 0 refills | Status: DC

## 2019-06-04 NOTE — Consult Note (Signed)
CARDIOLOGY CONSULT NOTE  Patient ID: [name] MRN: [id number] DOB/AGE: May 11, 1961 58 y.o.  Admit date: 06/02/2019 Referring Physician: [facility] Reason for Consultation:  Afib  HPI:   58 y/o African American male with hypertension, CAD (mid RCA PCI 02/2017), persistent Afib/flutter, severe COPD, ESRD on hemodialysis, chronic hepatitis B and C with liver cirrhosis, prior h/o GI bleed, h/o polysubstance abuse, admitted with Afib/flutter.   Patient has had recurrent ED presentations with tachycardia, associated with chest pain and shortness of breath. While in the hospital, his rate is very controlled on metoprolol tartarate 100 mg bid. At this point, he denies any complaint of chest pain, shortness of breath.  Historically, he has bene noncompliant with medical therapy, including warfarin,    Past Medical History:  Diagnosis Date  . Anemia   . Anxiety   . Arthritis    left shoulder  . Atherosclerosis of aorta ([place])   . Cardiomegaly   . Chest pain    DATE UNKNOWN, C/O PERIODICALLY  . Cocaine abuse ([place])   . COPD exacerbation ([place]) 08/17/2016  . Coronary artery disease    stent 02/22/17  . ESRD (end stage renal disease) on dialysis ([place])    "[name]; MWF" (07/04/2017)  . GERD (gastroesophageal reflux disease)    DATE UNKNOWN  . Hemorrhoids   . Hepatitis B, chronic ([place])   . Hepatitis C   . History of kidney stones   . Hyperkalemia   . Hypertension   . Kidney failure   . Metabolic bone disease    Patient denies  . Mitral stenosis   . Myocardial infarction ([place])   . Pneumonia   . Pulmonary edema   . Solitary rectal ulcer syndrome 07/2017   at flex sig for rectal bleeding  . Tubular adenoma of colon      Past Surgical History:  Procedure Laterality Date  . A/V FISTULAGRAM Left 05/26/2017   Procedure: A/V FISTULAGRAM;  Surgeon: Conrad Porter, MD;  Location: [place] [facility];  Service: Cardiovascular;  Laterality: Left;  . A/V FISTULAGRAM  Right 11/18/2017   Procedure: A/V FISTULAGRAM - Right Arm;  Surgeon: Elam Dutch, MD;  Location: [place] [facility];  Service: Cardiovascular;  Laterality: Right;  . APPLICATION OF WOUND VAC Left 06/14/2017   Procedure: APPLICATION OF WOUND VAC;  Surgeon: Katha Cabal, MD;  Location: [facility];  Service: Vascular;  Laterality: Left;  . AV FISTULA PLACEMENT  2012   BELIEVED WAS PLACED IN JUNE  . AV FISTULA PLACEMENT Right 08/09/2017   Procedure: Creation Right arm ARTERIOVENOUS BRACHIOCEPOHALIC FISTULA;  Surgeon: Elam Dutch, MD;  Location: [facility] OR;  Service: Vascular;  Laterality: Right;  . AV FISTULA PLACEMENT Right 11/22/2017   Procedure: INSERTION OF ARTERIOVENOUS (AV) GORE-TEX GRAFT RIGHT UPPER ARM;  Surgeon: Elam Dutch, MD;  Location: [place];  Service: Vascular;  Laterality: Right;  . BIOPSY  01/25/2018   Procedure: BIOPSY;  Surgeon: Jerene Bears, MD;  Location: [place];  Service: Gastroenterology;;  . BIOPSY  04/10/2019   Procedure: BIOPSY;  Surgeon: Jerene Bears, MD;  Location: [facility];  Service: Gastroenterology;;  . COLONOSCOPY    . COLONOSCOPY WITH PROPOFOL N/A 01/25/2018   Procedure: COLONOSCOPY WITH PROPOFOL;  Surgeon: Jerene Bears, MD;  Location: [place];  Service: Gastroenterology;  Laterality: N/A;  . CORONARY STENT INTERVENTION N/A 02/22/2017   Procedure: CORONARY STENT INTERVENTION;  Surgeon: Nigel Mormon, MD;  Location: [place] [facility];  Service: Cardiovascular;  Laterality: N/A;  . ESOPHAGOGASTRODUODENOSCOPY (EGD) WITH PROPOFOL N/A 01/25/2018   Procedure: ESOPHAGOGASTRODUODENOSCOPY (EGD) WITH PROPOFOL;  Surgeon: Jerene Bears, MD;  Location: [place];  Service: Gastroenterology;  Laterality: N/A;  . ESOPHAGOGASTRODUODENOSCOPY (EGD) WITH PROPOFOL N/A 04/10/2019   Procedure: ESOPHAGOGASTRODUODENOSCOPY (EGD) WITH PROPOFOL;  Surgeon: Jerene Bears, MD;  Location: [facility];  Service: Gastroenterology;  Laterality: N/A;  . FLEXIBLE  SIGMOIDOSCOPY N/A 07/15/2017   Procedure: FLEXIBLE SIGMOIDOSCOPY;  Surgeon: Carol Ada, MD;  Location: [place];  Service: Endoscopy;  Laterality: N/A;  . HEMORRHOID BANDING    . I&D EXTREMITY Left 06/01/2017   Procedure: IRRIGATION AND DEBRIDEMENT LEFT ARM HEMATOMA WITH LIGATION OF LEFT ARM AV FISTULA;  Surgeon: Elam Dutch, MD;  Location: [place];  Service: Vascular;  Laterality: Left;  . I&D EXTREMITY Left 06/14/2017   Procedure: IRRIGATION AND DEBRIDEMENT EXTREMITY;  Surgeon: Katha Cabal, MD;  Location: [facility];  Service: Vascular;  Laterality: Left;  . INSERTION OF DIALYSIS CATHETER  05/30/2017  . INSERTION OF DIALYSIS CATHETER N/A 05/30/2017   Procedure: INSERTION OF DIALYSIS CATHETER;  Surgeon: Elam Dutch, MD;  Location: [place];  Service: Vascular;  Laterality: N/A;  . IR PARACENTESIS  08/30/2017  . IR PARACENTESIS  09/29/2017  . IR PARACENTESIS  10/28/2017  . IR PARACENTESIS  11/09/2017  . IR PARACENTESIS  11/16/2017  . IR PARACENTESIS  11/28/2017  . IR PARACENTESIS  12/01/2017  . IR PARACENTESIS  12/06/2017  . IR PARACENTESIS  01/03/2018  . IR PARACENTESIS  01/23/2018  . IR PARACENTESIS  02/07/2018  . IR PARACENTESIS  02/21/2018  . IR PARACENTESIS  03/06/2018  . IR PARACENTESIS  03/17/2018  . IR PARACENTESIS  04/04/2018  . IR PARACENTESIS  12/28/2018  . IR PARACENTESIS  01/08/2019  . IR PARACENTESIS  01/23/2019  . IR PARACENTESIS  02/01/2019  . IR PARACENTESIS  02/19/2019  . IR PARACENTESIS  03/01/2019  . IR PARACENTESIS  03/15/2019  . IR PARACENTESIS  04/03/2019  . IR PARACENTESIS  04/12/2019  . IR PARACENTESIS  05/01/2019  . IR PARACENTESIS  05/08/2019  . IR PARACENTESIS  05/24/2019  . IR RADIOLOGIST EVAL & MGMT  02/14/2018  . IR RADIOLOGIST EVAL & MGMT  02/22/2019  . LEFT HEART CATH AND CORONARY ANGIOGRAPHY N/A 02/22/2017   Procedure: LEFT HEART CATH AND CORONARY ANGIOGRAPHY;  Surgeon: Nigel Mormon, MD;  Location: [place] [facility];  Service: Cardiovascular;   Laterality: N/A;  . LIGATION OF ARTERIOVENOUS  FISTULA Left 08/17/3333   Procedure: Plication of Left Arm Arteriovenous Fistula;  Surgeon: Elam Dutch, MD;  Location: [place];  Service: Vascular;  Laterality: Left;  . POLYPECTOMY    . POLYPECTOMY  01/25/2018   Procedure: POLYPECTOMY;  Surgeon: Jerene Bears, MD;  Location: [place];  Service: Gastroenterology;;  . REVISON OF ARTERIOVENOUS FISTULA Left 4/56/2563   Procedure: PLICATION OF DISTAL ANEURYSMAL SEGEMENT OF LEFT UPPER ARM ARTERIOVENOUS FISTULA;  Surgeon: Elam Dutch, MD;  Location: [place];  Service: Vascular;  Laterality: Left;  . REVISON OF ARTERIOVENOUS FISTULA Left 8/93/7342   Procedure: Plication of Left Upper Arm Fistula ;  Surgeon: Waynetta Sandy, MD;  Location: [place];  Service: Vascular;  Laterality: Left;  . SKIN GRAFT SPLIT THICKNESS LEG / FOOT Left    SKIN GRAFT SPLIT THICKNESS LEFT ARM DONOR SITE: LEFT ANTERIOR THIGH  . SKIN SPLIT GRAFT Left 07/04/2017   Procedure: SKIN GRAFT SPLIT THICKNESS LEFT ARM DONOR SITE: LEFT  ANTERIOR THIGH;  Surgeon: Elam Dutch, MD;  Location: [place];  Service: Vascular;  Laterality: Left;  . THROMBECTOMY W/ EMBOLECTOMY Left 06/05/2017   Procedure: EXPLORATION OF LEFT ARM FOR BLEEDING; OVERSEWED PROXIMAL FISTULA;  Surgeon: Angelia Mould, MD;  Location: [place];  Service: Vascular;  Laterality: Left;  . WOUND EXPLORATION Left 06/03/2017   Procedure: WOUND EXPLORATION WITH WOUND VAC APPLICATION TO LEFT ARM;  Surgeon: Angelia Mould, MD;  Location: [facility] OR;  Service: Vascular;  Laterality: Left;     Family History  Problem Relation Age of Onset  . Heart disease Mother   . Lung cancer Mother   . Heart disease Father   . Malignant hyperthermia Father   . COPD Father   . Throat cancer Sister   . Esophageal cancer Sister   . Hypertension Other   . COPD Other   . Colon cancer Neg Hx   . Colon polyps Neg Hx   . Rectal cancer Neg Hx   . Stomach cancer Neg Hx       Social History: Social History   Socioeconomic History  . Marital status: Single    Spouse name: Not on file  . Number of children: 3  . Years of education: 10  . Highest education level: Not on file  Occupational History  . Occupation: Unemployed  Social Needs  . Financial resource strain: Not on file  . Food insecurity    Worry: Not on file    Inability: Not on file  . Transportation needs    Medical: Not on file    Non-medical: Not on file  Tobacco Use  . Smoking status: Current Every Day Smoker    Packs/day: 0.50    Years: 43.00    Pack years: 21.50    Types: Cigarettes    Start date: 08/13/1973  . Smokeless tobacco: Never Used  . Tobacco comment: i dont know i just make   Substance and Sexual Activity  . Alcohol use: Not Currently    Frequency: Never    Comment: quit drinking in 2017  . Drug use: Yes    Types: Marijuana, Cocaine    Comment: reports using once every 3 months, 04-06-2019 was this   . Sexual activity: Not on file  Lifestyle  . Physical activity    Days per week: Not on file    Minutes per session: Not on file  . Stress: Not on file  Relationships  . Social Herbalist on phone: Not on file    Gets together: Not on file    Attends religious service: Not on file    Active member of club or organization: Not on file    Attends meetings of clubs or organizations: Not on file    Relationship status: Not on file  . Intimate partner violence    Fear of current or ex partner: Not on file    Emotionally abused: Not on file    Physically abused: Not on file    Forced sexual activity: Not on file  Other Topics Concern  . Not on file  Social History Narrative   Lives alone   Caffeine use: Coffee-rare   Soda- daily      [facility] (03/10/17):   Originally from [facility]. Previously worked trimming trees. No pets currently. No bird or mold exposure.      Facility-Administered Medications Prior to Admission  Medication Dose Route  Frequency Provider Last Rate Last Dose  . albumin  human 25 % solution 50 g  50 g Intravenous UD Pyrtle, Lajuan Lines, MD       Medications Prior to Admission  Medication Sig Dispense Refill Last Dose  . albuterol (VENTOLIN HFA) 108 (90 Base) MCG/ACT inhaler Inhale 2 puffs into the lungs every 6 (six) hours as needed for wheezing or shortness of breath.   06/02/2019 at Unknown time  . hydrALAZINE (APRESOLINE) 100 MG tablet Take 0.5 tablets (50 mg total) by mouth 3 (three) times daily. 150 tablet 0 06/02/2019 at Unknown time  . metoprolol tartrate (LOPRESSOR) 50 MG tablet Take 1 tablet (50 mg total) by mouth 2 (two) times daily. 60 tablet 0 06/02/2019 at 0800  . montelukast (SINGULAIR) 10 MG tablet Take 10 mg by mouth every evening.    06/01/2019 at Unknown time  . ondansetron (ZOFRAN ODT) 4 MG disintegrating tablet Take 1 tablet (4 mg total) by mouth every 8 (eight) hours as needed for nausea or vomiting. 10 tablet 1 06/01/2019 at Unknown time  . pantoprazole (PROTONIX) 40 MG tablet Take 1 tablet (40 mg total) by mouth daily. 90 tablet 3 06/02/2019 at Unknown time  . sevelamer carbonate (RENVELA) 800 MG tablet Take 4 tablets with meals  And 2 tablets twice daily with snacks (Patient taking differently: Take 1,600 mg by mouth See admin instructions. Take 2 tablets (1600mg ) with meals & with snacks)   06/02/2019 at Unknown time  . sulfamethoxazole-trimethoprim (BACTRIM DS) 800-160 MG tablet Take 1 tablet by mouth daily. (Patient taking differently: Take 1 tablet by mouth 2 (two) times daily. ) 30 tablet 11 06/01/2019 at Unknown time  . SYMBICORT 160-4.5 MCG/ACT inhaler Inhale 2 puffs into the lungs 2 (two) times daily.    06/02/2019 at Unknown time  . zolpidem (AMBIEN) 10 MG tablet Take 10 mg by mouth at bedtime as needed for sleep.   06/01/2019 at Unknown time    Review of Systems  Constitution: Negative for decreased appetite, malaise/fatigue, weight gain and weight loss.  HENT: Negative for congestion.    Eyes: Negative for visual disturbance.  Cardiovascular: Negative for chest pain, dyspnea on exertion, leg swelling, palpitations and syncope.  Respiratory: Negative for cough.   Endocrine: Negative for cold intolerance.  Hematologic/Lymphatic: Does not bruise/bleed easily.  Skin: Negative for itching and rash.  Musculoskeletal: Negative for myalgias.  Gastrointestinal: Negative for abdominal pain, nausea and vomiting.  Genitourinary: Negative for dysuria.  Neurological: Negative for dizziness and weakness.  Psychiatric/Behavioral: The patient is not nervous/anxious.   All other systems reviewed and are negative.     Physical Exam: Physical Exam  Constitutional: He is oriented to person, place, and time. He appears well-developed and well-nourished. No distress.  HENT:  Head: Normocephalic and atraumatic.  Eyes: Pupils are equal, round, and reactive to light. Conjunctivae are normal.  Neck: No JVD present.  Cardiovascular: Normal rate, regular rhythm and intact distal pulses.  Murmur heard.  Harsh midsystolic murmur is present with a grade of 2/6 at the upper right sternal border radiating to the neck. High-pitched blowing holosystolic murmur is also present at the apex.  Low-pitched rumbling crescendo presystolic murmur is present with a grade of 1/6 at the apex. Pulmonary/Chest: Effort normal and breath sounds normal. He has no wheezes. He has no rales.  Abdominal: Soft. Bowel sounds are normal. He exhibits distension (Mild ascites). There is no rebound.  Musculoskeletal:        General: No edema.  Lymphadenopathy:    He has no cervical adenopathy.  Neurological: He is alert and oriented to person, place, and time. No cranial nerve deficit.  Skin: Skin is warm and dry.  Psychiatric: He has a normal mood and affect.  Nursing note and vitals reviewed.    Labs:   Lab Results  Component Value Date   WBC 9.6 06/02/2019   HGB 10.9 (L) 06/02/2019   HCT 32.0 (L) 06/02/2019    MCV 77.1 (L) 06/02/2019   PLT 181 06/02/2019    Recent Labs  Lab 06/02/19 1207  06/03/19 0800  NA 139   < > 136  K 3.8   < > 4.9  CL 99   < > 96*  CO2 29  --  29  BUN 26*   < > 34*  CREATININE 5.96*   < > 6.99*  CALCIUM 9.8  --  9.6  PROT 6.9  --   --   BILITOT 0.4  --   --   ALKPHOS 118  --   --   ALT 28  --   --   AST 48*  --   --   GLUCOSE 120*   < > 99   < > = values in this interval not displayed.    Lipid Panel     Component Value Date/Time   CHOL 103 08/17/2016 0134   TRIG 97 08/17/2016 0134   HDL 34 (L) 08/17/2016 0134   CHOLHDL 3.0 08/17/2016 0134   VLDL 19 08/17/2016 0134   LDLCALC 50 08/17/2016 0134    BNP (last 3 results) Recent Labs    05/29/19 0900  BNP >4,500.0*    HEMOGLOBIN A1C Lab Results  Component Value Date   HGBA1C 4.6 (L) 04/24/2017   MPG 85.32 04/24/2017    Cardiac Panel (last 3 results) No results for input(s): CKTOTAL, CKMB, RELINDX in the last 8760 hours.  Invalid input(s): TROPONINHS  Lab Results  Component Value Date   CKTOTAL 274 (H) 12/23/2010   CKMB 4.6 (H) 12/23/2010     TSH No results for input(s): TSH in the last 8760 hours.    Radiology: Dg Chest Portable 1 View  Result Date: 06/02/2019 CLINICAL DATA:  Chest pain, shortness of breath EXAM: PORTABLE CHEST 1 VIEW COMPARISON:  06/01/2019 FINDINGS: Stable cardiomegaly. Persistent pulmonary vascular congestion with interstitial edema. Previously seen developing opacity in the left mid lung is less conspicuous compared to prior exam. No new areas of focal airspace consolidation. No pleural effusion. No pneumothorax. IMPRESSION: 1. Stable cardiomegaly with pulmonary vascular congestion and interstitial edema. 2. Previously seen developing opacity in the left mid lung is less conspicuous compared to prior exam. Electronically Signed   By: Davina Poke M.D.   On: 06/02/2019 14:21    Scheduled Meds: . Chlorhexidine Gluconate Cloth  6 each Topical Q0600  . heparin   5,000 Units Subcutaneous Q8H  . metoprolol tartrate  100 mg Oral BID  . mometasone-formoterol  2 puff Inhalation BID  . montelukast  10 mg Oral QPM  . pantoprazole  40 mg Oral Daily  . ramelteon  8 mg Oral QHS  . sevelamer carbonate  1,600 mg Oral TID WC  . sulfamethoxazole-trimethoprim  1 tablet Oral Daily   Continuous Infusions: PRN Meds:.acetaminophen, albuterol, lactulose, ondansetron, oxyCODONE  CARDIAC STUDIES:  EKG 06/03/2019: Atrial flutter with variable conduction. LBBB.  Outpatient echocardiogram 04/03/2018: Severe concentric LVH. EF 45-50%. Septal flattening due to RV pressure/volume overload. Indeterminate diastolic filling pattern due to MV calcification and Afib. Mod biatrial dilatation.   Right ventricle  cavity is mildly dilated. Normal right ventricular function. Trileaflet aortic valve with no regurgitation noted. Mild aortic valve leaflet calcification with mild aortic stenosis. Mean PG 12 mmHg. AVA 1.6 cm2. Moderate calcification of the mitral valve annulus. Mild mitral valve leaflet calcification. Mild calcific mitral valve stenosis. Mean PG 8 mmHg, MVA 2.5 cm2. Moderate tricuspid regurgitation due to annular dilatation. Moderate to severe pulmonary hypertension. PA systolic pressure 66 mm with CVP 15 mm Hg. IVC is dilated with poor inspiration collapse consistent with elevated right atrial pressure. No significant change compared to previous study in 08/2017.    Assessment & Recommendations:  58 y/o [place] American male with hypertension, CAD (mid RCA PCI 02/2017), persistent Afib/flutter, severe COPD, ESRD on hemodialysis, chronic hepatitis B and C with liver cirrhosis, prior h/o GI bleed, h/o polysubstance abuse, admitted with Afib/flutter.   Afib/flutter: Persistent, with variable AV conduction. While compliant with medical therapy, as during inpatient stay, his rate is very well controlled. Noncompliance is most likely reason for his episodes with RVR. In  spite of CHA2DS2VAsc score 3, he has not been on anticoagulation for a long time due to h/o GI bleed, cirrhosis, and noncompliance. With ongoing black stools with no recent EGD, holding off anticoagulation given bleeding risks is appropriate. Given his medical comorbidities (liver cirrhosis, ESRD), I do not think flutter ablation will have any meaningful impact. Continue medical management at this time with increased dose of metoprolol tartarate 100 mg bid, ensuring compliance. If he continues to have recurrent RVR in spite of adequate compliance, will discuss with EP. Brief anticoagulation may be necessary, should he undergo ablation.   Recurrent ascites: Given his valvular dysfunction, pulmonary hypertension, he would likely have increased risk of cardiac decompensation with TIPS.  Overall long term cardiac and medical prognosis remains poor.   Nigel Mormon, MD 06/04/2019, 12:03 PM [facility]. [place] Pager: [phone] Office: [phone] If no answer Cell [phone]

## 2019-06-04 NOTE — Progress Notes (Addendum)
Subjective:  Awoken  From  Sleep , no cos tolerated hd yest on holiday schedule / Cardiology to see  Per admit   Objective Vital signs in last 24 hours: Vitals:   06/04/19 0600 06/04/19 0601 06/04/19 0819 06/04/19 0846  BP:  119/80  (!) 158/77  Pulse:   60 65  Resp:   18 14  Temp: 98.1 F (36.7 C) 98.1 F (36.7 C)    TempSrc: Oral Oral    SpO2:  96% 99%   Weight:  67.2 kg     Weight change:   Physical Exam: General: thin chronically ill AAM, NAD  Heart: Irreg, irreg Rate stable, 1/6 sem ,no rub or gallop Lungs: CTA , unlabored breathing  Abdomen: BS pos and slightly hyperactive, ascites, tender R upper and lower  Extremities: No pedal edema   Dialysis Access:Pos bruit R UA   Dialysis Orders:  MWF at [facility] 4hr 450/800, EDW 65kg, 2K/2Ca, AVG, no heparin - Parsabiv 2.5mg  IV q HD - Calcitriol 2.65mcg PO q HD - Mircera 50mg  IV q 2 weeks (ordered, not yet given - last OP dose 27mcg on 10/16)  Problem/Plan: 1. Recurrent  Symptomatic A.Fi/ flutter with RVR - plans per admit/ Card to see  2. ESRD - HD MWF Schedule  With Holiday  Schedule =Sunday , Tues , Fri K 4.9    3. HTN/volume - Admit CXR  pulm  Edema  uf 2.5 yest hd  Wt  1.8  KG > edw post  Hd / tomor uf 2.5 l on hd  4. Anemia - hgb 10.9  Aranesp  25 mcg given  11/22 ( q mon HD ) 5. Secondary hyperparathyroidism-Ac / phos stable  , Calcitriol po on hd , phos binder with meals 6. Hep B/ Ho cirrhosis with recurrent Ascites  = HD in isolation / close to need for his Paracentesis  7. COPD=stable 8. CAD  Ernest Haber, PA-C [place] [phone] 06/04/2019,12:41 PM  LOS: 1 day   Pt seen, examined and agree w A/P as above.  [name]  MD 06/04/2019, 4:47 PM    Labs: Basic Metabolic Panel: Recent Labs  Lab 05/30/19 0412  06/01/19 0506 06/02/19 1207 06/02/19 1215 06/03/19 0800  NA 138   < > 136 139 137 136  K 5.4*   < > 4.5 3.8 3.7 4.9  CL 101   < > 98 99 98 96*  CO2 24   < > 22 29  --  29   GLUCOSE 134*   < > 164* 120* 118* 99  BUN 31*   < > 27* 26* 28* 34*  CREATININE 6.87*   < > 6.72* 5.96* 6.00* 6.99*  CALCIUM 9.3   < > 9.5 9.8  --  9.6  PHOS 3.6  --   --   --   --  2.9   < > = values in this interval not displayed.   Liver Function Tests: Recent Labs  Lab 05/30/19 0412 06/02/19 1207 06/03/19 0800  AST  --  48*  --   ALT  --  28  --   ALKPHOS  --  118  --   BILITOT  --  0.4  --   PROT  --  6.9  --   ALBUMIN 3.1* 3.5 3.1*   Recent Labs  Lab 06/02/19 1207  LIPASE 34   No results for input(s): AMMONIA in the last 168 hours. CBC: Recent Labs  Lab 05/29/19 0900 05/29/19 1614  05/29/19 1930 05/30/19 0412 06/01/19 0506 06/02/19 1203 06/02/19 1215  WBC 6.2 8.3 8.5 8.2 7.8 9.6  --   NEUTROABS 4.3  --   --   --   --  7.3  --   HGB 9.6* 10.7* 10.0* 9.1* 9.9* 9.9* 10.9*  HCT 27.2* 30.5* 28.5* 26.1* 28.3* 27.6* 32.0*  MCV 77.3* 78.2* 77.9* 77.9* 77.3* 77.1*  --   PLT 131* 164 142* 134* 160 181  --    Cardiac Enzymes: No results for input(s): CKTOTAL, CKMB, CKMBINDEX, TROPONINI in the last 168 hours.  Medications:  . Chlorhexidine Gluconate Cloth  6 each Topical Q0600  . heparin  5,000 Units Subcutaneous Q8H  . metoprolol tartrate  100 mg Oral BID  . mometasone-formoterol  2 puff Inhalation BID  . montelukast  10 mg Oral QPM  . pantoprazole  40 mg Oral Daily  . ramelteon  8 mg Oral QHS  . sevelamer carbonate  1,600 mg Oral TID WC  . sulfamethoxazole-trimethoprim  1 tablet Oral Daily

## 2019-06-04 NOTE — Discharge Summary (Signed)
Name: [name] MRN: [id number] DOB: [birth year] 58 y.o. PCP: [name], FNP  Date of Admission: 06/02/2019 11:40 AM Date of Discharge: 06/04/2019 Attending Physician: No att. providers found  Discharge Diagnosis: 1. Atrial fibrillation/flutter with RVR  Discharge Medications: Allergies as of 06/04/2019      Reactions   Morphine And Related Other (See Comments)   Stomach pain   Aspirin Other (See Comments)   STOMACH PAIN   Clonidine Derivatives Itching   Tramadol Itching   Tylenol [acetaminophen] Nausea Only   Stomach ache      Medication List    TAKE these medications   hydrALAZINE 100 MG tablet Commonly known as: APRESOLINE Take 0.5 tablets (50 mg total) by mouth 3 (three) times daily.   lactulose 10 GM/15ML solution Commonly known as: CHRONULAC TAKE 45 MLS BY MOUTH 2 TIMES DAILY.   metoprolol tartrate 100 MG tablet Commonly known as: LOPRESSOR Take 1 tablet (100 mg total) by mouth 2 (two) times daily. What changed:   medication strength  how much to take   montelukast 10 MG tablet Commonly known as: SINGULAIR Take 10 mg by mouth every evening.   ondansetron 4 MG disintegrating tablet Commonly known as: Zofran ODT Take 1 tablet (4 mg total) by mouth every 8 (eight) hours as needed for nausea or vomiting.   pantoprazole 40 MG tablet Commonly known as: PROTONIX Take 1 tablet (40 mg total) by mouth daily.   sevelamer carbonate 800 MG tablet Commonly known as: RENVELA Take 4 tablets with meals  And 2 tablets twice daily with snacks What changed:   how much to take  how to take this  when to take this  additional instructions   sulfamethoxazole-trimethoprim 800-160 MG tablet Commonly known as: BACTRIM DS Take 1 tablet by mouth daily. What changed: when to take this   Symbicort 160-4.5 MCG/ACT inhaler Generic drug: budesonide-formoterol Inhale 2 puffs into the lungs 2 (two) times daily.   Ventolin HFA 108 (90 Base)  MCG/ACT inhaler Generic drug: albuterol Inhale 2 puffs into the lungs every 6 (six) hours as needed for wheezing or shortness of breath.   zolpidem 10 MG tablet Commonly known as: AMBIEN Take 10 mg by mouth at bedtime as needed for sleep.       Disposition and follow-up:   Mr.[name] was discharged from [facility] in Stable condition.  At the hospital follow up visit please address:  1.  #Atrial fibrillation/flutter with RVR  Has prior h/o Afib and was already on metoprolol 50mg  BID after recent admission for afib with RVR. Unclear what triggered refractory RVR.  We were able to achieve rate control with IV diltiazem and then transitioned to po metoprolol 100mg  BID.  2.  Labs / imaging needed at time of follow-up: none  3.  Pending labs/ test needing follow-up: none  Follow-up Appointments: [facility] Follow up in 1 week(s).   Specialty: Internal Medicine Contact information: [address] [phone] [place] [facility] Course by problem list: [name] is a 58 year old male with past medical history of decompensated cirrhosis with recurrent ascites and history of SBP, ESRD on HD, paroxysmal atrial fibrillation, COPD, HTN, and CAD status post stent 02/2017 who presented with tachycardia.  1. Atrial fibrillation/flutter with RVR: He was treated with IV diltiazem and transitioned to 100 mg metoprolol po  BID. Cardiology was consulted and said there are no great definitive treatment options for him at this time and to continue 100 mg metoprolol for now.  Discharge Vitals:   BP (!) 158/77 (BP Location: Right Leg)   Pulse 65   Temp 98.1 F (36.7 C) (Oral)   Resp 14   Wt 67.2 kg   SpO2 99%   BMI 21.25 kg/m   Pertinent Labs, Studies, and Procedures:  BMP Latest Ref Rng & Units 06/03/2019 06/02/2019 06/02/2019  Glucose 70 - 99  mg/dL 99 118(H) 120(H)  BUN 6 - 20 mg/dL 34(H) 28(H) 26(H)  Creatinine 0.61 - 1.24 mg/dL 6.99(H) 6.00(H) 5.96(H)  Sodium 135 - 145 mmol/L 136 137 139  Potassium 3.5 - 5.1 mmol/L 4.9 3.7 3.8  Chloride 98 - 111 mmol/L 96(L) 98 99  CO2 22 - 32 mmol/L 29 - 29  Calcium 8.9 - 10.3 mg/dL 9.6 - 9.8     Discharge Instructions: Discharge Instructions    Diet - low sodium heart healthy   Complete by: As directed    Increase activity slowly   Complete by: As directed       Signed: Al Decant, MD 06/05/2019, 1:35 PM   Pager: [phone]

## 2019-06-04 NOTE — Progress Notes (Signed)
Discharge AVS meds take and those due reviewed with pt. Follow up appointments and when to call MD reviewed. All questions and concerns addressed. No further questions at this time. D/c IV and TELE, CCMD notified. D/C home per orders. Pt niece to come pick pt up, pt brought down via wheelchair with all belongings with staff. Amanda Cockayne, RN

## 2019-06-04 NOTE — Discharge Instructions (Signed)
You were admitted to the hospital for a fast heart rate. You were treated with an IV medication and then we increased your home dose of metoprolol to 100 twice a day. You heart rate is well controlled on this dose. Please pick up this prescription and continue taking metoprolol 100 mg twice a day. Please follow up in our clinic within a week.

## 2019-06-04 NOTE — Progress Notes (Signed)
CSW consulted for transportation. Patient reported to RN he did not have a ride but when CSW arrived he advised his niece is on the way to pick him up. Since it was the end of the day- CSW provided RN with taxi voucher as a back plan.   Thurmond Butts, MSW, [facility] Social Worker [phone]

## 2019-06-04 NOTE — Progress Notes (Signed)
   Subjective:  Overnight he refused his subq heparin because the injections are painful due to his ascites.  Otherwise no acute events overnight.  Did not receive his metoprolol last night due to bradycardia yesterday, but did receive his morning dose this morning.  Vital signs stable including HR running in the 50s-60s overnight, and still in the 60s after morning dose today.  Denies CP or palpitations.  Objective:  Vital signs in last 24 hours: Vitals:   06/04/19 0600 06/04/19 0601 06/04/19 0819 06/04/19 0846  BP:  119/80  (!) 158/77  Pulse:   60 65  Resp:   18 14  Temp: 98.1 F (36.7 C) 98.1 F (36.7 C)    TempSrc: Oral Oral    SpO2:  96% 99%   Weight:  67.2 kg     Weight change:   Intake/Output Summary (Last 24 hours) at 06/04/2019 1032 Last data filed at 06/03/2019 1900 Gross per 24 hour  Intake 720 ml  Output 2500 ml  Net -1780 ml    General: awake, alert, lying comfortably in bed in NAD CV: irregular rhythm, rate-controlled, +7/9 holosystolic murmur GI: abdomen mildly distended Neuro: A&Ox3; no focal deficits Skin: warm and dry Psych: normal mood and affect     Assessment/Plan:  Active Problems:   ESRD on dialysis [facility])   Liver cirrhosis (HCC)   Atrial fibrillation with RVR ([place])   58 y.o. male with past medical history of decompensated cirrhosis with recurrent ascites and history of SBP, ESRD on HD, paroxysmal atrial fibrillation, COPD, HTN, and CAD status post stent 02/2017 who presented on 11/21 for short readmission for recurrent Afib/Aflutter with RVR with associated chest pain and SOB.  Afib/Aflutter: Attempted to transition to po metoprolol 100mg  BID yesterday but he developed asymptomatic bradycardic into the 30s-40s after first dose while still on diltiazem drip.  Diltiazem drip was held.  He continued to be intermittently bradycardic in the 40s-50s throughout the day so evening metoprolol dose was also held.  HR was in the 50s-60s overnight.  He did  receive his morning metoprolol 100mg  today and has continued to be rate-controlled, HR now in the 60s. - Given his multiple recent ED visits and short readmission for refractory atrial flutter/fibrillation, and now difficulty achieving rate control without resultant bradycardia, consulting Cardiology today for guidance on further management (appreciate their involvement) - Continue to hold diltiazem drip - Continue to monitor on telemetry - Not on anticoagulation due to increased bleeding risk  ESRD on HD M/W/F - Nephrology consulted yesterday, received HD (Sunday) - Per Nephrology, getting HD on holiday schedule this week of Sun/Tues/Fri  Hx of Cirrhosis  - Continued on SBP ppx, lactulose; no evidence of acute decompensation  Dispo: Anticipated discharge today or tomorrow pending Cardiology recommendations   LOS: 1 day   Tonia Ghent, Medical Student 06/04/2019, 10:32 AM

## 2019-06-05 ENCOUNTER — Ambulatory Visit (INDEPENDENT_AMBULATORY_CARE_PROVIDER_SITE_OTHER): Payer: Medicare Other | Admitting: Internal Medicine

## 2019-06-05 ENCOUNTER — Ambulatory Visit (HOSPITAL_COMMUNITY)
Admission: RE | Admit: 2019-06-05 | Discharge: 2019-06-05 | Disposition: A | Discharge status: Home / Self Care | Payer: Medicare Other | Source: Ambulatory Visit | Attending: Physician Assistant | Admitting: Physician Assistant

## 2019-06-05 ENCOUNTER — Other Ambulatory Visit: Payer: Self-pay

## 2019-06-05 ENCOUNTER — Encounter (HOSPITAL_COMMUNITY): Payer: Self-pay | Admitting: Physician Assistant

## 2019-06-05 ENCOUNTER — Telehealth: Payer: Self-pay | Admitting: Internal Medicine

## 2019-06-05 VITALS — BP 124/58 | HR 71 | Ht 70.0 in | Wt 147.4 lb

## 2019-06-05 DIAGNOSIS — B182 Chronic viral hepatitis C: Secondary | ICD-10-CM | POA: Insufficient documentation

## 2019-06-05 DIAGNOSIS — Z992 Dependence on renal dialysis: Secondary | ICD-10-CM | POA: Diagnosis not present

## 2019-06-05 DIAGNOSIS — R188 Other ascites: Secondary | ICD-10-CM | POA: Insufficient documentation

## 2019-06-05 DIAGNOSIS — K7460 Unspecified cirrhosis of liver: Secondary | ICD-10-CM | POA: Diagnosis not present

## 2019-06-05 DIAGNOSIS — K219 Gastro-esophageal reflux disease without esophagitis: Secondary | ICD-10-CM | POA: Diagnosis not present

## 2019-06-05 DIAGNOSIS — I484 Atypical atrial flutter: Secondary | ICD-10-CM

## 2019-06-05 DIAGNOSIS — I2510 Atherosclerotic heart disease of native coronary artery without angina pectoris: Secondary | ICD-10-CM | POA: Diagnosis not present

## 2019-06-05 DIAGNOSIS — Z8249 Family history of ischemic heart disease and other diseases of the circulatory system: Secondary | ICD-10-CM | POA: Diagnosis not present

## 2019-06-05 DIAGNOSIS — K7031 Alcoholic cirrhosis of liver with ascites: Secondary | ICD-10-CM

## 2019-06-05 DIAGNOSIS — F17210 Nicotine dependence, cigarettes, uncomplicated: Secondary | ICD-10-CM | POA: Diagnosis not present

## 2019-06-05 DIAGNOSIS — D6869 Other thrombophilia: Secondary | ICD-10-CM | POA: Diagnosis not present

## 2019-06-05 DIAGNOSIS — B181 Chronic viral hepatitis B without delta-agent: Secondary | ICD-10-CM | POA: Insufficient documentation

## 2019-06-05 DIAGNOSIS — Z79899 Other long term (current) drug therapy: Secondary | ICD-10-CM | POA: Diagnosis not present

## 2019-06-05 DIAGNOSIS — I4819 Other persistent atrial fibrillation: Secondary | ICD-10-CM | POA: Insufficient documentation

## 2019-06-05 DIAGNOSIS — I252 Old myocardial infarction: Secondary | ICD-10-CM | POA: Insufficient documentation

## 2019-06-05 DIAGNOSIS — I4892 Unspecified atrial flutter: Secondary | ICD-10-CM | POA: Diagnosis not present

## 2019-06-05 DIAGNOSIS — Z886 Allergy status to analgesic agent status: Secondary | ICD-10-CM | POA: Insufficient documentation

## 2019-06-05 DIAGNOSIS — N186 End stage renal disease: Secondary | ICD-10-CM | POA: Insufficient documentation

## 2019-06-05 DIAGNOSIS — D631 Anemia in chronic kidney disease: Secondary | ICD-10-CM | POA: Diagnosis not present

## 2019-06-05 DIAGNOSIS — Z888 Allergy status to other drugs, medicaments and biological substances status: Secondary | ICD-10-CM | POA: Diagnosis not present

## 2019-06-05 DIAGNOSIS — J449 Chronic obstructive pulmonary disease, unspecified: Secondary | ICD-10-CM | POA: Insufficient documentation

## 2019-06-05 DIAGNOSIS — I120 Hypertensive chronic kidney disease with stage 5 chronic kidney disease or end stage renal disease: Secondary | ICD-10-CM | POA: Insufficient documentation

## 2019-06-05 DIAGNOSIS — Z885 Allergy status to narcotic agent status: Secondary | ICD-10-CM | POA: Diagnosis not present

## 2019-06-05 DIAGNOSIS — I4891 Unspecified atrial fibrillation: Secondary | ICD-10-CM

## 2019-06-05 DIAGNOSIS — N2581 Secondary hyperparathyroidism of renal origin: Secondary | ICD-10-CM | POA: Diagnosis not present

## 2019-06-05 DIAGNOSIS — D509 Iron deficiency anemia, unspecified: Secondary | ICD-10-CM | POA: Diagnosis not present

## 2019-06-05 MED ORDER — ONDANSETRON 4 MG PO TBDP: 4.0000 mg | ORAL_TABLET | Freq: Three times a day (TID) | ORAL | 1 refills | Status: DC | PRN

## 2019-06-05 MED ORDER — PANTOPRAZOLE SODIUM 40 MG PO TBEC: 40.0000 mg | DELAYED_RELEASE_TABLET | Freq: Every day | ORAL | 3 refills | Status: DC

## 2019-06-05 MED ORDER — ONDANSETRON HCL 4 MG PO TABS: 4.0000 mg | ORAL_TABLET | Freq: Every day | ORAL | 1 refills | Status: AC | PRN

## 2019-06-05 NOTE — Telephone Encounter (Signed)
Added no albumin to the appt note.

## 2019-06-05 NOTE — Telephone Encounter (Signed)
Pt does NOT need alb as he already has ESRD This will be the case with future LVP as well

## 2019-06-05 NOTE — Progress Notes (Signed)
Primary Care Physician: [name], FNP Primary Cardiologist: Dr Virgina Jock Primary Electrophysiologist: none Referring Physician: [name]   [name] is a 58 y.o. male with a history of hypertension, CAD (mid RCA PCI 02/2017), persistent Afib/flutter, severe COPD, ESRD on hemodialysis, chronic hepatitis B and C with liver cirrhosis, prior h/o GI bleed, h/o polysubstance abuse who presents for consultation in the [facility]. He has had multiple ED visits and hospitalizations with recurrent afib with RVR. This appears to be related to medication noncompliance in the past. He is well rate controlled today on metoprolol. He is not on anticoagulation despite a CHADS2VASC score of 3 due to history of cirrhosis, GI bleeding, and medication noncompliance. Patient reports that he feels his abdomen is swelling again. He has frequent paracentesis and has another scheduled for next week. He is unaware of his arrhythmia.   Today, he denies symptoms of palpitations, chest pain, orthopnea, PND, lower extremity edema, dizziness, presyncope, syncope, snoring, daytime somnolence, bleeding, or neurologic sequela. The patient is tolerating medications without difficulties and is otherwise without complaint today.    Atrial Fibrillation Risk Factors:  he does not have symptoms or diagnosis of sleep apnea. he does not have a history of rheumatic fever.   he has a BMI of Body mass index is 21.15 kg/m.Marland Kitchen Filed Weights   06/05/19 1310  Weight: 66.9 kg    Family History  Problem Relation Age of Onset  . Heart disease Mother   . Lung cancer Mother   . Heart disease Father   . Malignant hyperthermia Father   . COPD Father   . Throat cancer Sister   . Esophageal cancer Sister   . Hypertension Other   . COPD Other   . Colon cancer Neg Hx   . Colon polyps Neg Hx   . Rectal cancer Neg Hx   . Stomach cancer Neg Hx      Atrial Fibrillation  Management history:  Previous antiarrhythmic drugs: none Previous cardioversions: none Previous ablations: non CHADS2VASC score: 3 Anticoagulation history: none   Past Medical History:  Diagnosis Date  . Anemia   . Anxiety   . Arthritis    left shoulder  . Atherosclerosis of aorta ([place])   . Cardiomegaly   . Chest pain    DATE UNKNOWN, C/O PERIODICALLY  . Cocaine abuse ([place])   . COPD exacerbation ([place]) 08/17/2016  . Coronary artery disease    stent 02/22/17  . ESRD (end stage renal disease) on dialysis ([place])    "[name]; MWF" (07/04/2017)  . GERD (gastroesophageal reflux disease)    DATE UNKNOWN  . Hemorrhoids   . Hepatitis B, chronic ([place])   . Hepatitis C   . History of kidney stones   . Hyperkalemia   . Hypertension   . Kidney failure   . Metabolic bone disease    Patient denies  . Mitral stenosis   . Myocardial infarction ([place])   . Pneumonia   . Pulmonary edema   . Solitary rectal ulcer syndrome 07/2017   at flex sig for rectal bleeding  . Tubular adenoma of colon    Past Surgical History:  Procedure Laterality Date  . A/V FISTULAGRAM Left 05/26/2017   Procedure: A/V FISTULAGRAM;  Surgeon: Conrad Holly Hill, MD;  Location: [place] [facility];  Service: Cardiovascular;  Laterality: Left;  . A/V FISTULAGRAM Right 11/18/2017   Procedure: A/V FISTULAGRAM - Right Arm;  Surgeon: Elam Dutch,  MD;  Location: [place] [facility];  Service: Cardiovascular;  Laterality: Right;  . APPLICATION OF WOUND VAC Left 06/14/2017   Procedure: APPLICATION OF WOUND VAC;  Surgeon: Katha Cabal, MD;  Location: [facility];  Service: Vascular;  Laterality: Left;  . AV FISTULA PLACEMENT  2012   BELIEVED WAS PLACED IN JUNE  . AV FISTULA PLACEMENT Right 08/09/2017   Procedure: Creation Right arm ARTERIOVENOUS BRACHIOCEPOHALIC FISTULA;  Surgeon: Elam Dutch, MD;  Location: [facility];  Service: Vascular;  Laterality: Right;  . AV FISTULA PLACEMENT Right 11/22/2017   Procedure:  INSERTION OF ARTERIOVENOUS (AV) GORE-TEX GRAFT RIGHT UPPER ARM;  Surgeon: Elam Dutch, MD;  Location: [place];  Service: Vascular;  Laterality: Right;  . BIOPSY  01/25/2018   Procedure: BIOPSY;  Surgeon: Jerene Bears, MD;  Location: [place];  Service: Gastroenterology;;  . BIOPSY  04/10/2019   Procedure: BIOPSY;  Surgeon: Jerene Bears, MD;  Location: [facility];  Service: Gastroenterology;;  . COLONOSCOPY    . COLONOSCOPY WITH PROPOFOL N/A 01/25/2018   Procedure: COLONOSCOPY WITH PROPOFOL;  Surgeon: Jerene Bears, MD;  Location: [place];  Service: Gastroenterology;  Laterality: N/A;  . CORONARY STENT INTERVENTION N/A 02/22/2017   Procedure: CORONARY STENT INTERVENTION;  Surgeon: Nigel Mormon, MD;  Location: [place] [facility];  Service: Cardiovascular;  Laterality: N/A;  . ESOPHAGOGASTRODUODENOSCOPY (EGD) WITH PROPOFOL N/A 01/25/2018   Procedure: ESOPHAGOGASTRODUODENOSCOPY (EGD) WITH PROPOFOL;  Surgeon: Jerene Bears, MD;  Location: [place];  Service: Gastroenterology;  Laterality: N/A;  . ESOPHAGOGASTRODUODENOSCOPY (EGD) WITH PROPOFOL N/A 04/10/2019   Procedure: ESOPHAGOGASTRODUODENOSCOPY (EGD) WITH PROPOFOL;  Surgeon: Jerene Bears, MD;  [facility]: [facility];  Service: Gastroenterology;  Laterality: N/A;  . FLEXIBLE SIGMOIDOSCOPY N/A 07/15/2017   Procedure: FLEXIBLE SIGMOIDOSCOPY;  Surgeon: Carol Ada, MD;  Location: [place];  Service: Endoscopy;  Laterality: N/A;  . HEMORRHOID BANDING    . I&D EXTREMITY Left 06/01/2017   Procedure: IRRIGATION AND DEBRIDEMENT LEFT ARM HEMATOMA WITH LIGATION OF LEFT ARM AV FISTULA;  Surgeon: Elam Dutch, MD;  Location: [place];  Service: Vascular;  Laterality: Left;  . I&D EXTREMITY Left 06/14/2017   Procedure: IRRIGATION AND DEBRIDEMENT EXTREMITY;  Surgeon: Katha Cabal, MD;  Location: [facility];  Service: Vascular;  Laterality: Left;  . INSERTION OF DIALYSIS CATHETER  05/30/2017  . INSERTION OF DIALYSIS CATHETER N/A  05/30/2017   Procedure: INSERTION OF DIALYSIS CATHETER;  Surgeon: Elam Dutch, MD;  Location: [place];  Service: Vascular;  Laterality: N/A;  . IR PARACENTESIS  08/30/2017  . IR PARACENTESIS  09/29/2017  . IR PARACENTESIS  10/28/2017  . IR PARACENTESIS  11/09/2017  . IR PARACENTESIS  11/16/2017  . IR PARACENTESIS  11/28/2017  . IR PARACENTESIS  12/01/2017  . IR PARACENTESIS  12/06/2017  . IR PARACENTESIS  01/03/2018  . IR PARACENTESIS  01/23/2018  . IR PARACENTESIS  02/07/2018  . IR PARACENTESIS  02/21/2018  . IR PARACENTESIS  03/06/2018  . IR PARACENTESIS  03/17/2018  . IR PARACENTESIS  04/04/2018  . IR PARACENTESIS  12/28/2018  . IR PARACENTESIS  01/08/2019  . IR PARACENTESIS  01/23/2019  . IR PARACENTESIS  02/01/2019  . IR PARACENTESIS  02/19/2019  . IR PARACENTESIS  03/01/2019  . IR PARACENTESIS  03/15/2019  . IR PARACENTESIS  04/03/2019  . IR PARACENTESIS  04/12/2019  . IR PARACENTESIS  05/01/2019  . IR PARACENTESIS  05/08/2019  . IR PARACENTESIS  05/24/2019  .  IR RADIOLOGIST EVAL & MGMT  02/14/2018  . IR RADIOLOGIST EVAL & MGMT  02/22/2019  . LEFT HEART CATH AND CORONARY ANGIOGRAPHY N/A 02/22/2017   Procedure: LEFT HEART CATH AND CORONARY ANGIOGRAPHY;  Surgeon: Nigel Mormon, MD;  Location: [place] [facility];  Service: Cardiovascular;  Laterality: N/A;  . LIGATION OF ARTERIOVENOUS  FISTULA Left 08/16/5807   Procedure: Plication of Left Arm Arteriovenous Fistula;  Surgeon: Elam Dutch, MD;  Location: [place];  Service: Vascular;  Laterality: Left;  . POLYPECTOMY    . POLYPECTOMY  01/25/2018   Procedure: POLYPECTOMY;  Surgeon: Jerene Bears, MD;  Location: [place];  Service: Gastroenterology;;  . REVISON OF ARTERIOVENOUS FISTULA Left 9/83/3825   Procedure: PLICATION OF DISTAL ANEURYSMAL SEGEMENT OF LEFT UPPER ARM ARTERIOVENOUS FISTULA;  Surgeon: Elam Dutch, MD;  Location: [place];  Service: Vascular;  Laterality: Left;  . REVISON OF ARTERIOVENOUS FISTULA Left 0/53/9767   Procedure:  Plication of Left Upper Arm Fistula ;  Surgeon: Waynetta Sandy, MD;  Location: [place];  Service: Vascular;  Laterality: Left;  . SKIN GRAFT SPLIT THICKNESS LEG / FOOT Left    SKIN GRAFT SPLIT THICKNESS LEFT ARM DONOR SITE: LEFT ANTERIOR THIGH  . SKIN SPLIT GRAFT Left 07/04/2017   Procedure: SKIN GRAFT SPLIT THICKNESS LEFT ARM DONOR SITE: LEFT ANTERIOR THIGH;  Surgeon: Elam Dutch, MD;  Location: [place];  Service: Vascular;  Laterality: Left;  . THROMBECTOMY W/ EMBOLECTOMY Left 06/05/2017   Procedure: EXPLORATION OF LEFT ARM FOR BLEEDING; OVERSEWED PROXIMAL FISTULA;  Surgeon: Angelia Mould, MD;  Location: [place];  Service: Vascular;  Laterality: Left;  . WOUND EXPLORATION Left 06/03/2017   Procedure: WOUND EXPLORATION WITH WOUND VAC APPLICATION TO LEFT ARM;  Surgeon: Angelia Mould, MD;  Location: [facility] OR;  Service: Vascular;  Laterality: Left;    Current Outpatient Medications  Medication Sig Dispense Refill  . albuterol (VENTOLIN HFA) 108 (90 Base) MCG/ACT inhaler Inhale 2 puffs into the lungs every 6 (six) hours as needed for wheezing or shortness of breath.    . hydrALAZINE (APRESOLINE) 100 MG tablet Take 0.5 tablets (50 mg total) by mouth 3 (three) times daily. 150 tablet 0  . lactulose (CHRONULAC) 10 GM/15ML solution TAKE 45 MLS BY MOUTH 2 TIMES DAILY. 8100 mL 0  . metoprolol tartrate (LOPRESSOR) 100 MG tablet Take 1 tablet (100 mg total) by mouth 2 (two) times daily. 60 tablet 0  . montelukast (SINGULAIR) 10 MG tablet Take 10 mg by mouth every evening.     . ondansetron (ZOFRAN ODT) 4 MG disintegrating tablet Take 1 tablet (4 mg total) by mouth every 8 (eight) hours as needed for nausea or vomiting. 10 tablet 1  . pantoprazole (PROTONIX) 40 MG tablet Take 1 tablet (40 mg total) by mouth daily. 90 tablet 3  . sevelamer carbonate (RENVELA) 800 MG tablet Take 4 tablets with meals  And 2 tablets twice daily with snacks (Patient taking differently: Take 1,600 mg by  mouth See admin instructions. Take 2 tablets (1600mg ) with meals & with snacks)    . sulfamethoxazole-trimethoprim (BACTRIM DS) 800-160 MG tablet Take 1 tablet by mouth daily. (Patient taking differently: Take 1 tablet by mouth 2 (two) times daily. ) 30 tablet 11  . SYMBICORT 160-4.5 MCG/ACT inhaler Inhale 2 puffs into the lungs 2 (two) times daily.     Marland Kitchen zolpidem (AMBIEN) 10 MG tablet Take 10 mg by mouth at bedtime as needed for sleep.  Current Facility-Administered Medications  Medication Dose Route Frequency Provider Last Rate Last Dose  . albumin human 25 % solution 50 g  50 g Intravenous UD Pyrtle, Lajuan Lines, MD        Allergies  Allergen Reactions  . Morphine And Related Other (See Comments)    Stomach pain  . Aspirin Other (See Comments)    STOMACH PAIN  . Clonidine Derivatives Itching  . Tramadol Itching  . Tylenol [Acetaminophen] Nausea Only    Stomach ache    Social History   Socioeconomic History  . Marital status: Single    Spouse name: Not on file  . Number of children: 3  . Years of education: 10  . Highest education level: Not on file  Occupational History  . Occupation: Unemployed  Social Needs  . Financial resource strain: Not on file  . Food insecurity    Worry: Not on file    Inability: Not on file  . Transportation needs    Medical: Not on file    Non-medical: Not on file  Tobacco Use  . Smoking status: Current Every Day Smoker    Packs/day: 0.50    Years: 43.00    Pack years: 21.50    Types: Cigarettes    Start date: 08/13/1973  . Smokeless tobacco: Never Used  . Tobacco comment: i dont know i just make   Substance and Sexual Activity  . Alcohol use: Not Currently    Frequency: Never    Comment: quit drinking in 2017  . Drug use: Not Currently    Types: Marijuana, Cocaine    Comment: reports using once every 3 months, 04-06-2019 was this   . Sexual activity: Not on file  Lifestyle  . Physical activity    Days per week: Not on file     Minutes per session: Not on file  . Stress: Not on file  Relationships  . Social Herbalist on phone: Not on file    Gets together: Not on file    Attends religious service: Not on file    Active member of club or organization: Not on file    Attends meetings of clubs or organizations: Not on file    Relationship status: Not on file  . Intimate partner violence    Fear of current or ex partner: Not on file    Emotionally abused: Not on file    Physically abused: Not on file    Forced sexual activity: Not on file  Other Topics Concern  . Not on file  Social History Narrative   Lives alone   Caffeine use: Coffee-rare   Soda- daily      [facility] (03/10/17):   Originally from [facility]. Previously worked trimming trees. No pets currently. No bird or mold exposure.      ROS- All systems are reviewed and negative except as per the HPI above.  Physical Exam: Vitals:   06/05/19 1310  BP: (!) 124/58  Pulse: 71  Weight: 66.9 kg  Height: 5\' 10"  (1.778 m)    GEN- The patient is well appearing, alert and oriented x 3 today.   Head- normocephalic, atraumatic Eyes-  Sclera clear, conjunctiva pink Ears- hearing intact Oropharynx- clear Neck- supple  Lungs- Clear to ausculation bilaterally, normal work of breathing Heart- irregular rate and rhythm, no murmurs, rubs or gallops  GI- ascites, + BS Extremities- no clubbing, cyanosis, or edema MS- no significant deformity or atrophy Skin- no rash or lesion  Psych- euthymic mood, full affect Neuro- strength and sensation are intact  Wt Readings from Last 3 Encounters:  06/05/19 66.9 kg  06/04/19 67.2 kg  05/31/19 65.2 kg    EKG today demonstrates atypical atrial flutter HR 71, LBBB, QRS 166, QTc 552  Echo 08/30/17 demonstrated  - Left ventricle: The cavity size was normal. There was severe   concentric hypertrophy. Systolic function was normal. The   estimated ejection fraction was in the range of 55% to 60%.    Hypokinesis of the anteroseptal and inferoseptal myocardium. - Ventricular septum: The contour showed diastolic flattening and   systolic flattening consistent with right ventricular pressure   and volume overload. - Aortic valve: Transvalvular velocity was within the normal range.   There was no stenosis. There was no regurgitation. - Mitral valve: Calcified annulus. Moderately thickened leaflets .   Mobility was restricted. The findings are consistent with severe   stenosis. There was mild regurgitation. Mean gradient (D): 16 mm   Hg. Valve area by pressure half-time: 2.01 cm^2. - Left atrium: The atrium was severely dilated. - Right ventricle: The cavity size was severely dilated. Wall   thickness was normal. Systolic function was normal. - Right atrium: The atrium was severely dilated. - Atrial septum: No defect or patent foramen ovale was identified   by color flow Doppler. - Tricuspid valve: There was moderate regurgitation. - Pulmonary arteries: PA peak pressure: 91 mm Hg (S). - Pericardium, extracardiac: A moderate pericardial effusion was   identified along the left ventricular free wall measuring up to   1.64 cm. Unchanged compared with the echo 05/2017. There was no   chamber collapse. There was evidence for increased RV-LV   interaction demonstrated by respirophasic changes in transmitral   velocities and tricuspid velocities. The possibility of   hemodynamic compromise cannot be excluded on the basis of   available images. - Impressions: Severe mitral stenosis by mean gradient. However,   calculated valve area indicates mild stenosis. The leaflets are   restricted but there does not appear to be severe stenosis.  Impressions:  - Severe mitral stenosis by mean gradient. However, calculated   valve area indicates mild stenosis. The leaflets are restricted   but there does not appear to be severe stenosis.  Epic records are reviewed at length today  Assessment and  Plan:  1. Persistent atrial fibrillation/atrial flutter General education about afib/flutter provided and questions answered.  Therapeutic options are limited given his medical history and history of noncompliance with anticoagulation. LA severely dilated on echo (46 mm with 128 mL volume) Would recommend continued rate control for now as he is asymptomatic and rate controlled on metoprolol 100 mg BID.  This patients CHA2DS2-VASc Score and unadjusted Ischemic Stroke Rate (% per year) is equal to 3.2 % stroke rate/year from a score of 3  Above score calculated as 1 point each if present [CHF, HTN, DM, Vascular=MI/PAD/Aortic Plaque, Age if 65-74, or Male] Above score calculated as 2 points each if present [Age > 75, or Stroke/TIA/TE]   2. CAD RCA PCI 2018. Followed by Dr Virgina Jock. No anginal symptoms today.  3. Recurrent Ascites Followed by Dr Hilarie Fredrickson. Patient scheduled for paracentesis.    Follow up with Jeri Lager NP as scheduled. AF clinic as needed.   [facility] [address] [place], [place] [address] [phone] 06/05/2019 1:42 PM

## 2019-06-05 NOTE — Patient Instructions (Addendum)
[name],  It was a pleasure meeting you today. Today we looked at your belly with an ultrasound, and it is recommended to keep your appointment with radiology on June 13, 2019. We will refill your zofran for your nausea as well as your protonix. Please continue to take your metoprolol 100 mg two times a day to control your heart rate. Please reach out if you begin to have a fast heart beat, or a change in your condition.  Sincerely,  Maudie Mercury

## 2019-06-05 NOTE — Progress Notes (Signed)
CC: Atrial Fibrillation and Abdominal Distension  HPI:  [name] is a 58 y.o., with a PMH noted below, who presents to the clinic for a follow up for rate control of his recent discharge and abdominal distension. To see the management of his acute and chronic conditions, please see the A&P note under the encounters tab.   Past Medical History:  Diagnosis Date  . Anemia   . Anxiety   . Arthritis    left shoulder  . Atherosclerosis of aorta ([place])   . Cardiomegaly   . Chest pain    DATE UNKNOWN, C/O PERIODICALLY  . Cocaine abuse ([place])   . COPD exacerbation ([place]) 08/17/2016  . Coronary artery disease    stent 02/22/17  . ESRD (end stage renal disease) on dialysis ([place])    "[name]; MWF" (07/04/2017)  . GERD (gastroesophageal reflux disease)    DATE UNKNOWN  . Hemorrhoids   . Hepatitis B, chronic ([place])   . Hepatitis C   . History of kidney stones   . Hyperkalemia   . Hypertension   . Kidney failure   . Metabolic bone disease    Patient denies  . Mitral stenosis   . Myocardial infarction ([place])   . Pneumonia   . Pulmonary edema   . Solitary rectal ulcer syndrome 07/2017   at flex sig for rectal bleeding  . Tubular adenoma of colon    Review of Systems:   Review of Systems  Constitutional: Negative for chills, fever, malaise/fatigue and weight loss.  Eyes: Negative for blurred vision, double vision and photophobia.  Respiratory: Negative for cough.   Cardiovascular: Negative for chest pain, palpitations and orthopnea.  Gastrointestinal: Positive for abdominal pain, nausea and vomiting. Negative for blood in stool, constipation and diarrhea.       Patient states that he has vomited due to abdominal pain. The vomitus is the clear and is "mucus" in consistency. Denies blood in vomitus.  Patient also complains of abdominal pain with a scheduled paracentesis June 13, 2019.    Musculoskeletal: Negative for back pain, joint pain, myalgias and neck pain.    Physical Exam:  Vitals:   06/05/19 1422  BP: (!) 138/58  Pulse: 69  Temp: 98.3 F (36.8 C)  TempSrc: Oral  SpO2: 99%  Weight: 149 lb 1.6 oz (67.6 kg)  Height: 5\' 10"  (1.778 m)   Physical Exam Vitals signs and nursing note reviewed.  Constitutional:      Appearance: He is well-developed.     Comments: Patient sitting in chair, limited communication.   HENT:     Head: Normocephalic.  Cardiovascular:     Rate and Rhythm: Normal rate and regular rhythm.     Heart sounds: Normal heart sounds. No murmur. No friction rub. No gallop.   Pulmonary:     Effort: Pulmonary effort is normal. No respiratory distress.     Breath sounds: Normal breath sounds. No wheezing or rhonchi.  Abdominal:     General: Abdomen is protuberant. Bowel sounds are normal. There is distension.     Palpations: Abdomen is rigid.     Tenderness: There is generalized abdominal tenderness.     Comments: Patient with complaints of tenderness and the environment being cool.   Skin:    Coloration: Skin is not mottled.     Findings: No erythema.     Comments: Multiple punctate scars noted in the RLQ  Neurological:     Mental Status: He is alert and oriented  to person, place, and time.     Assessment & Plan:   See Encounters Tab for problem based charting.  Patient seen with Dr. Evette Doffing

## 2019-06-05 NOTE — Telephone Encounter (Signed)
Called and [facility] has an appt available at 1pm today but per epic pt has an OV scheduled at 1:30pm. Left message for pt to call back to discuss further.  Pt scheduled for IR para at [facility] 06/12/19@9am . Pt aware. Max amt to be drawn off 6 liters, pt to receive albumin 50gm IV, fluid for cell ct and diff.

## 2019-06-06 ENCOUNTER — Encounter: Payer: Self-pay | Admitting: Internal Medicine

## 2019-06-06 NOTE — Assessment & Plan Note (Signed)
Patient states that he has abdominal distension with pain and nausea. Patient endorses multiple paracentesis procedures due to his cirrhosis. Patient has an appointment with radiology 06/13/2019, but inquired about a paracentesis today. An abdominal ultrasound was performed, and a mild to moderate perfusion was visualized. The risk of perforation due to the amount of fluid was deemed greater than the benefit of withdrawal, and the patient was instructed to keep his radiology appointment.  Plan:  - Refill Zofran 4 mg PRN for nausea - Patient instructed to follow up with radiology appointment on 06/13/2019 for paracentesis.

## 2019-06-06 NOTE — Assessment & Plan Note (Signed)
Patient was recently admitted to the [facility]. Patient's home dose of metoprolol 50 mg BID was increased to 100 mg BID. Patient has no complaints of dizziness, headache, or fatigue. Patient denies heart palpitations, SHOB, or other indicators of uncontrolled A-fib. At today's visit his pulses were regular, his HR was 69.  Plan:  - Continue Metoprolol 100 mg BID - Continue to monitor at follow up with PCP

## 2019-06-06 NOTE — Assessment & Plan Note (Signed)
Patient with a PMH of GERD in need of a refill of his protonix.   Plan:  - Protonix 40 mg ordered.

## 2019-06-07 ENCOUNTER — Emergency Department (HOSPITAL_COMMUNITY)
Admission: EM | Admit: 2019-06-07 | Discharge: 2019-06-07 | Disposition: A | Discharge status: Home / Self Care | Payer: Medicare Other | Source: Home / Self Care | Attending: Emergency Medicine | Admitting: Emergency Medicine

## 2019-06-07 ENCOUNTER — Other Ambulatory Visit: Payer: Self-pay

## 2019-06-07 ENCOUNTER — Emergency Department (HOSPITAL_COMMUNITY): Payer: Medicare Other

## 2019-06-07 DIAGNOSIS — I517 Cardiomegaly: Secondary | ICD-10-CM | POA: Diagnosis not present

## 2019-06-07 DIAGNOSIS — Z03818 Encounter for observation for suspected exposure to other biological agents ruled out: Secondary | ICD-10-CM | POA: Diagnosis not present

## 2019-06-07 DIAGNOSIS — R0600 Dyspnea, unspecified: Secondary | ICD-10-CM | POA: Diagnosis not present

## 2019-06-07 DIAGNOSIS — F17210 Nicotine dependence, cigarettes, uncomplicated: Secondary | ICD-10-CM | POA: Insufficient documentation

## 2019-06-07 DIAGNOSIS — I120 Hypertensive chronic kidney disease with stage 5 chronic kidney disease or end stage renal disease: Secondary | ICD-10-CM | POA: Insufficient documentation

## 2019-06-07 DIAGNOSIS — Z20828 Contact with and (suspected) exposure to other viral communicable diseases: Secondary | ICD-10-CM | POA: Insufficient documentation

## 2019-06-07 DIAGNOSIS — J449 Chronic obstructive pulmonary disease, unspecified: Secondary | ICD-10-CM | POA: Insufficient documentation

## 2019-06-07 DIAGNOSIS — Z79899 Other long term (current) drug therapy: Secondary | ICD-10-CM | POA: Insufficient documentation

## 2019-06-07 DIAGNOSIS — R140 Abdominal distension (gaseous): Secondary | ICD-10-CM | POA: Diagnosis not present

## 2019-06-07 DIAGNOSIS — R188 Other ascites: Secondary | ICD-10-CM

## 2019-06-07 DIAGNOSIS — E875 Hyperkalemia: Secondary | ICD-10-CM | POA: Diagnosis not present

## 2019-06-07 DIAGNOSIS — N186 End stage renal disease: Secondary | ICD-10-CM | POA: Insufficient documentation

## 2019-06-07 DIAGNOSIS — R0602 Shortness of breath: Secondary | ICD-10-CM | POA: Diagnosis not present

## 2019-06-07 DIAGNOSIS — R180 Malignant ascites: Secondary | ICD-10-CM | POA: Insufficient documentation

## 2019-06-07 DIAGNOSIS — I2510 Atherosclerotic heart disease of native coronary artery without angina pectoris: Secondary | ICD-10-CM | POA: Insufficient documentation

## 2019-06-07 DIAGNOSIS — Z992 Dependence on renal dialysis: Secondary | ICD-10-CM | POA: Insufficient documentation

## 2019-06-07 DIAGNOSIS — B182 Chronic viral hepatitis C: Secondary | ICD-10-CM | POA: Insufficient documentation

## 2019-06-07 LAB — CBC WITH DIFFERENTIAL/PLATELET
Abs Immature Granulocytes: 0.06 10*3/uL (ref 0.00–0.07)
Basophils Absolute: 0.1 10*3/uL (ref 0.0–0.1)
Basophils Relative: 1 %
Eosinophils Absolute: 0.1 10*3/uL (ref 0.0–0.5)
Eosinophils Relative: 1 %
HCT: 26.8 % — ABNORMAL LOW (ref 39.0–52.0)
Hemoglobin: 9.5 g/dL — ABNORMAL LOW (ref 13.0–17.0)
Immature Granulocytes: 1 %
Lymphocytes Relative: 15 %
Lymphs Abs: 1.4 10*3/uL (ref 0.7–4.0)
MCH: 27.7 pg (ref 26.0–34.0)
MCHC: 35.4 g/dL (ref 30.0–36.0)
MCV: 78.1 fL — ABNORMAL LOW (ref 80.0–100.0)
Monocytes Absolute: 0.9 10*3/uL (ref 0.1–1.0)
Monocytes Relative: 10 %
Neutro Abs: 6.8 10*3/uL (ref 1.7–7.7)
Neutrophils Relative %: 72 %
Platelets: 193 10*3/uL (ref 150–400)
RBC: 3.43 MIL/uL — ABNORMAL LOW (ref 4.22–5.81)
RDW: 16.3 % — ABNORMAL HIGH (ref 11.5–15.5)
WBC: 9.4 10*3/uL (ref 4.0–10.5)
nRBC: 0 % (ref 0.0–0.2)

## 2019-06-07 LAB — COMPREHENSIVE METABOLIC PANEL
ALT: 29 U/L (ref 0–44)
AST: 37 U/L (ref 15–41)
Albumin: 3.3 g/dL — ABNORMAL LOW (ref 3.5–5.0)
Alkaline Phosphatase: 129 U/L — ABNORMAL HIGH (ref 38–126)
Anion gap: 13 (ref 5–15)
BUN: 33 mg/dL — ABNORMAL HIGH (ref 6–20)
CO2: 28 mmol/L (ref 22–32)
Calcium: 10 mg/dL (ref 8.9–10.3)
Chloride: 98 mmol/L (ref 98–111)
Creatinine, Ser: 8.15 mg/dL — ABNORMAL HIGH (ref 0.61–1.24)
GFR calc Af Amer: 8 mL/min — ABNORMAL LOW (ref >60)
GFR calc non Af Amer: 7 mL/min — ABNORMAL LOW (ref >60)
Glucose, Bld: 104 mg/dL — ABNORMAL HIGH (ref 70–99)
Potassium: 4.6 mmol/L (ref 3.5–5.1)
Sodium: 139 mmol/L (ref 135–145)
Total Bilirubin: 1 mg/dL (ref 0.3–1.2)
Total Protein: 6.6 g/dL (ref 6.5–8.1)

## 2019-06-07 LAB — PROTIME-INR
INR: 1.1 (ref 0.8–1.2)
Prothrombin Time: 14 seconds (ref 11.4–15.2)

## 2019-06-07 LAB — ETHANOL: Alcohol, Ethyl (B): <10 mg/dL (ref <10)

## 2019-06-07 LAB — SARS CORONAVIRUS 2 (TAT 6-24 HRS): SARS Coronavirus 2: NEGATIVE

## 2019-06-07 LAB — LIPASE, BLOOD: Lipase: 35 U/L (ref 11–51)

## 2019-06-07 IMAGING — US IR PARACENTESIS
1 series · 3 of 3 positions shown · non-contrast
Comparison: none

INDICATION: History of hepatitis C. Chronic renal insufficiency on dialysis.
Abdominal distention secondary to recurrent ascites. Request for
diagnostic and therapeutic paracentesis.

[Series 1: ir (id) (id)/(id)/(id) ir · 3 of 3 slices shown]
[im 1/3]
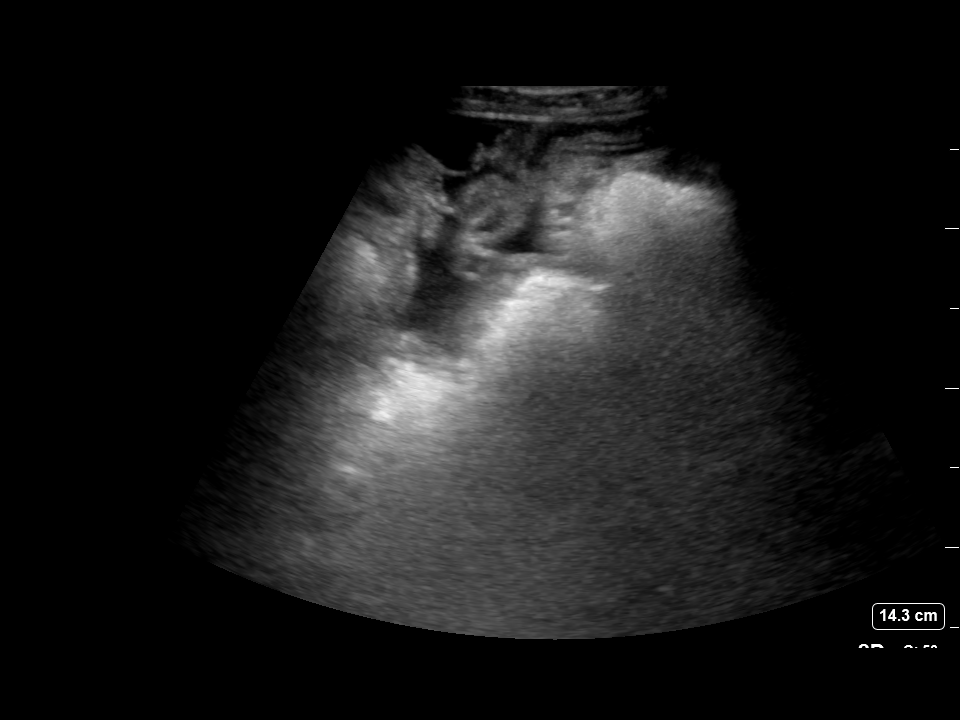
[im 2/3]
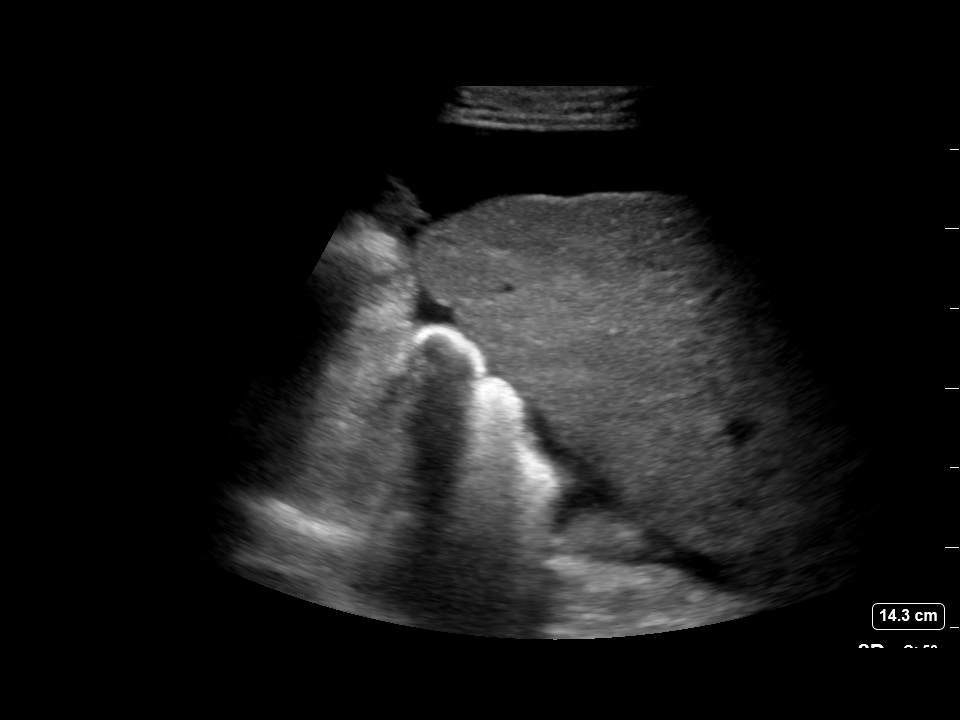
[im 3/3]
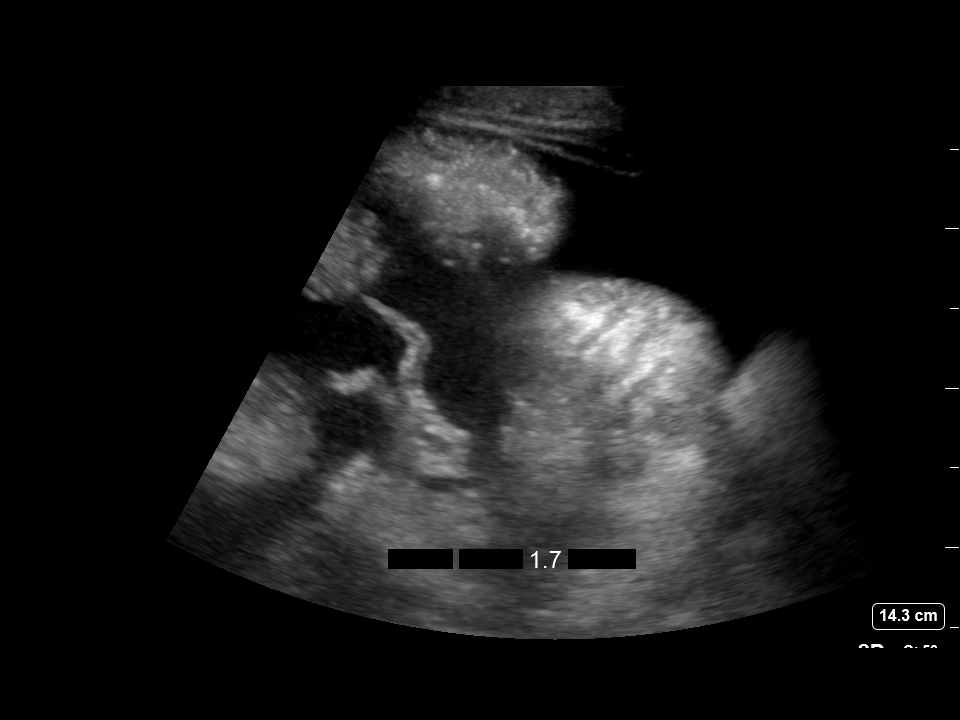

[3 of 3 positions shown; findings below may reference images not displayed]

EXAM:
ULTRASOUND GUIDED LEFT LOWER QUADRANT PARACENTESIS

MEDICATIONS:
None.

COMPLICATIONS:
None immediate.

PROCEDURE:
Informed written consent was obtained from the patient after a
discussion of the risks, benefits and alternatives to treatment. A
timeout was performed prior to the initiation of the procedure.

Initial ultrasound scanning demonstrates a moderate amount of
ascites within the left lower abdominal quadrant. The left lower
abdomen was prepped and draped in the usual sterile fashion. 1%
lidocaine with epinephrine was used for local anesthesia.

Following this, a 19 gauge, 7-cm, Yueh catheter was introduced. An
ultrasound image was saved for documentation purposes. The
paracentesis was performed. The catheter was removed and a dressing
was applied. The patient tolerated the procedure well without
immediate post procedural complication.
FINDINGS: A total of approximately 1.7 L of hazy, amber colored fluid was
removed. Samples were sent to the laboratory as requested by the
clinical team.
IMPRESSION: Successful ultrasound-guided paracentesis yielding 1.7 liters of
peritoneal fluid.

## 2019-06-07 IMAGING — DX DG CHEST 2V
2 series · 2 of 2 positions shown · non-contrast
Comparison: PA and lateral chest x-ray November 15, 2017

CLINICAL DATA: Two weeks of abdominal pain and chest tightness.
In-states dialysis dependent renal failure. History of coronary
artery disease and COPD.

EXAM:
CHEST - 2 VIEW

[x chest ap]
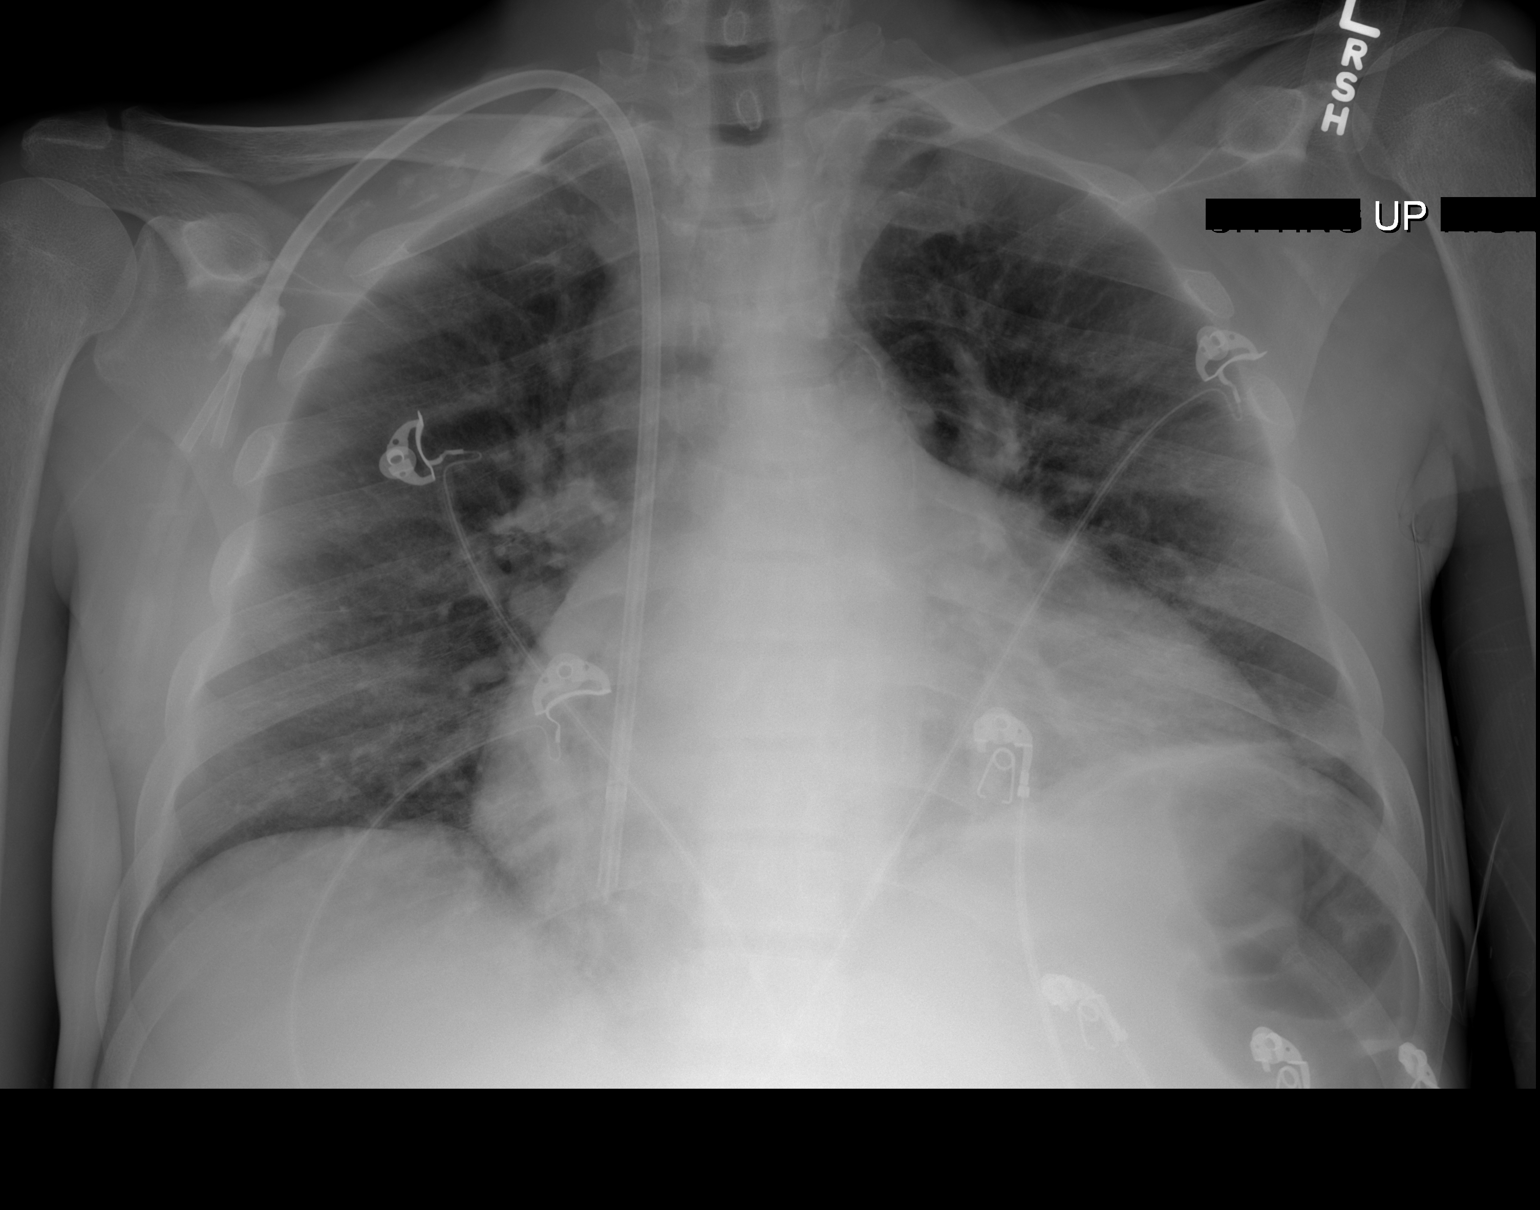

[w chest lat]
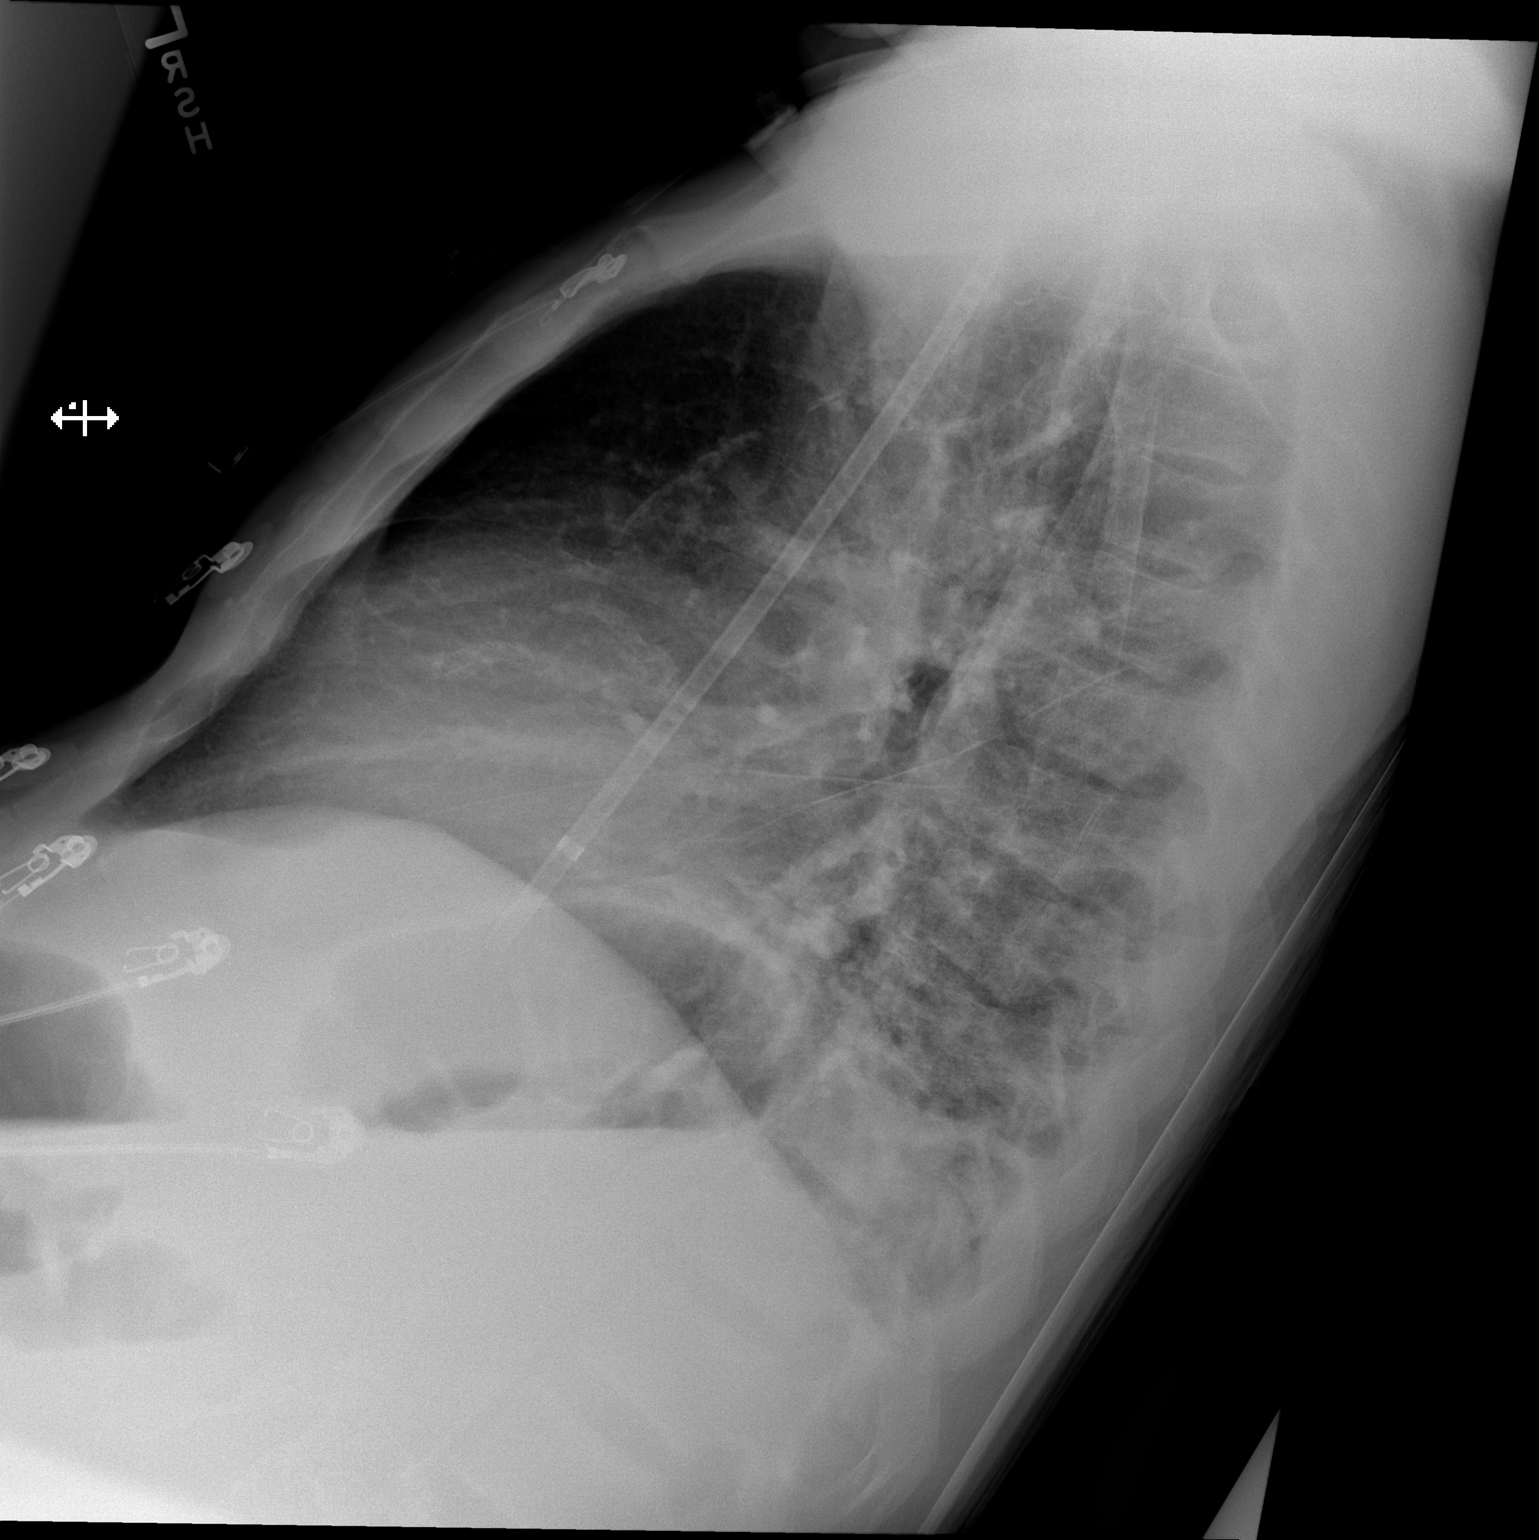

[2 of 2 positions shown; findings below may reference images not displayed]

FINDINGS: The lungs are adequately inflated. The interstitial markings are
mildly prominent but this is not entirely new. The cardiac
silhouette is enlarged and more conspicuous today. The pulmonary
vascularity is more engorged. There is calcification in the wall of
the thoracic aorta. A dual-lumen dialysis catheter terminates at the
cavoatrial junction. The observed bony thorax exhibits no acute
abnormality.
IMPRESSION: Mild CHF which is likely both acute and chronic. No alveolar
pneumonia.

Thoracic aortic atherosclerosis.

## 2019-06-07 MED ORDER — OXYCODONE HCL 5 MG PO TABS
5.0000 mg | ORAL_TABLET | Freq: Once | ORAL | Status: AC
  Administered 2019-06-07: 5 mg via ORAL
  Filled 2019-06-07: qty 1

## 2019-06-07 NOTE — Discharge Instructions (Addendum)
1.  An order has been placed for you to get scheduled for an outpatient paracentesis.  Also, call your doctor on Monday to discuss the follow-up evaluation and assist you in ongoing care.

## 2019-06-07 NOTE — ED Triage Notes (Signed)
Came in BIB EMS; c/o shortness of breathe. Stated usually gets "fluid removed from abdomen" ecery two weeks. Pt reported he can't wait that long and is having difficulty breathing. Reported dialysis pt as well MWF.

## 2019-06-07 NOTE — ED Provider Notes (Addendum)
[facility] EMERGENCY DEPARTMENT Provider Note   CSN: [id number] Arrival date & time: 06/07/19  1554     History   Chief Complaint Chief Complaint  Patient presents with  . Shortness of Breath    HPI [name] is a 58 y.o. male.     HPI Patient reports that he is short of breath.  He states that he needs "a paracentesis".  He reports the reason he short of breath is because his abdomen has filled with fluid.  He says "his liver is leaking".  Patient reports he has pain on the left lateral lower abdomen.  He thinks this is because of the fluid.  He denies he has had any fever.  He has not had vomiting or diarrhea.  He is requesting something for pain.  He also is requesting to get his paracentesis done.  He denies he is having chest pain.  He denies he is having cough or fever.  He denies any swelling or pain of the lower extremities.  Patient reports due to the holiday schedule and he is on Sunday Tuesday dialysis which he reports he did have this week.  He reports he will get dialyzed on his regular schedule next week. Past Medical History:  Diagnosis Date  . Anemia   . Anxiety   . Arthritis    left shoulder  . Atherosclerosis of aorta ([place])   . Cardiomegaly   . Chest pain    DATE UNKNOWN, C/O PERIODICALLY  . Cocaine abuse ([place])   . COPD exacerbation ([place]) 08/17/2016  . Coronary artery disease    stent 02/22/17  . ESRD (end stage renal disease) on dialysis ([place])    "[name]; MWF" (07/04/2017)  . GERD (gastroesophageal reflux disease)    DATE UNKNOWN  . Hemorrhoids   . Hepatitis B, chronic ([place])   . Hepatitis C   . History of kidney stones   . Hyperkalemia   . Hypertension   . Kidney failure   . Metabolic bone disease    Patient denies  . Mitral stenosis   . Myocardial infarction ([place])   . Pneumonia   . Pulmonary edema   . Solitary rectal ulcer syndrome 07/2017   at flex sig for rectal bleeding  . Tubular adenoma of colon      Patient Active Problem List   Diagnosis Date Noted  . Acquired thrombophilia ([place]) 06/05/2019  . A-fib ([place]) 05/30/2019  . Atrial fibrillation with RVR ([place]) 05/29/2019  . Melena   . Pressure injury of skin 03/09/2019  . Abdominal distention   . Volume overload 12/28/2018  . Sepsis ([place]) 09/12/2018  . Atherosclerosis of native coronary artery of native heart without angina pectoris 03/11/2018  . Benign neoplasm of cecum   . Benign neoplasm of ascending colon   . Benign neoplasm of descending colon   . Benign neoplasm of rectum   . Paroxysmal atrial fibrillation ([place]) 01/23/2018  . Hx of colonic polyps 01/20/2018  . ESRD (end stage renal disease) ([place]) 11/21/2017  . GERD (gastroesophageal reflux disease) 11/16/2017  . Liver cirrhosis ([place]) 11/15/2017  . DNR (do not resuscitate)   . Palliative care by specialist   . Hyponatremia 11/04/2017  . SBP (spontaneous bacterial peritonitis) ([place]) 10/30/2017  . Liver disease, chronic 10/30/2017  . SOB (shortness of breath)   . Abdominal pain 10/28/2017  . Upper airway cough syndrome with flattening on f/v loop 10/13/17 c/w vcd 10/17/2017  . Elevated diaphragm 10/13/2017  .  Ileus ([place]) 09/29/2017  . Malnutrition of moderate degree 09/29/2017  . Sinus congestion 09/03/2017  . Symptomatic anemia 09/02/2017  . Other cirrhosis of liver ([place]) 09/02/2017  . Left bundle branch block 09/02/2017  . Mitral stenosis 09/02/2017  . Hematochezia 07/15/2017  . Wide-complex tachycardia ([place])   . Endotracheally intubated   . ESRD on dialysis ([place]) 07/04/2017  . Acute respiratory failure with hypoxia ([place]) 06/18/2017  . CKD (chronic kidney disease) stage V requiring chronic dialysis ([place]) 06/18/2017  . History of Cocaine abuse ([place]) 06/18/2017  . Hypertension 06/18/2017  . Infection of AV graft for dialysis ([place]) 06/18/2017  . Anxiety 06/18/2017  . Anemia due to chronic kidney disease 06/18/2017  . Atypical atrial flutter ([place]) 06/18/2017  .  Personality disorder ([place]) 06/13/2017  . Cellulitis 06/12/2017  . Adjustment disorder with mixed anxiety and depressed mood 06/10/2017  . Suicidal ideation 06/10/2017  . Arm wound, left, sequela 06/10/2017  . Dyspnea on exertion 05/29/2017  . Tachycardia 05/29/2017  . Hyperkalemia 05/22/2017  . Acute metabolic encephalopathy   . Anemia 04/23/2017  . Ascites 04/23/2017  . COPD (chronic obstructive pulmonary disease) ([place]) 04/23/2017  . Acute on chronic respiratory failure with hypoxia ([place]) 03/25/2017  . Arrhythmia 03/25/2017  . COPD GOLD 0 with flattening on inps f/v  09/27/2016  . Essential hypertension 09/27/2016  . Fluid overload 08/30/2016  . COPD exacerbation ([place]) 08/17/2016  . Hypertensive urgency 08/17/2016  . Respiratory failure ([place]) 08/17/2016  . Problem with dialysis access ([place]) 07/23/2016  . Chronic hepatitis B ([place]) 03/05/2014  . Chronic hepatitis C without hepatic coma ([place]) 03/05/2014  . Internal hemorrhoids with bleeding, swelling and itching 03/05/2014  . Thrombocytopenia ([place]) 03/05/2014  . Chest pain 02/27/2014  . Alcohol abuse 04/14/2009  . Cigarette smoker 04/14/2009  . GANGLION CYST 04/14/2009    Past Surgical History:  Procedure Laterality Date  . A/V FISTULAGRAM Left 05/26/2017   Procedure: A/V FISTULAGRAM;  Surgeon: Conrad Westfield, MD;  Location: [facility];  Service: Cardiovascular;  Laterality: Left;  . A/V FISTULAGRAM Right 11/18/2017   Procedure: A/V FISTULAGRAM - Right Arm;  Surgeon: Elam Dutch, MD;  Location: [facility];  Service: Cardiovascular;  Laterality: Right;  . APPLICATION OF WOUND VAC Left 06/14/2017   Procedure: APPLICATION OF WOUND VAC;  Surgeon: Katha Cabal, MD;  Location: [facility] ORS;  Service: Vascular;  Laterality: Left;  . AV FISTULA PLACEMENT  2012   BELIEVED WAS PLACED IN JUNE  . AV FISTULA PLACEMENT Right 08/09/2017   Procedure: Creation Right arm ARTERIOVENOUS BRACHIOCEPOHALIC FISTULA;  Surgeon:  Elam Dutch, MD;  Location: [facility] OR;  Service: Vascular;  Laterality: Right;  . AV FISTULA PLACEMENT Right 11/22/2017   Procedure: INSERTION OF ARTERIOVENOUS (AV) GORE-TEX GRAFT RIGHT UPPER ARM;  Surgeon: Elam Dutch, MD;  Location: [facility];  Service: Vascular;  Laterality: Right;  . BIOPSY  01/25/2018   Procedure: BIOPSY;  Surgeon: Jerene Bears, MD;  Location: [facility];  Service: Gastroenterology;;  . BIOPSY  04/10/2019   Procedure: BIOPSY;  Surgeon: Jerene Bears, MD;  Location: [facility];  Service: Gastroenterology;;  . COLONOSCOPY    . COLONOSCOPY WITH PROPOFOL N/A 01/25/2018   Procedure: COLONOSCOPY WITH PROPOFOL;  Surgeon: Jerene Bears, MD;  Location: [facility];  Service: Gastroenterology;  Laterality: N/A;  . CORONARY STENT INTERVENTION N/A 02/22/2017   Procedure: CORONARY STENT INTERVENTION;  Surgeon: Nigel Mormon, MD;  Location: [facility];  Service:  Cardiovascular;  Laterality: N/A;  . ESOPHAGOGASTRODUODENOSCOPY (EGD) WITH PROPOFOL N/A 01/25/2018   Procedure: ESOPHAGOGASTRODUODENOSCOPY (EGD) WITH PROPOFOL;  Surgeon: Jerene Bears, MD;  Location: [facility]COPY;  Service: Gastroenterology;  Laterality: N/A;  . ESOPHAGOGASTRODUODENOSCOPY (EGD) WITH PROPOFOL N/A 04/10/2019   Procedure: ESOPHAGOGASTRODUODENOSCOPY (EGD) WITH PROPOFOL;  Surgeon: Jerene Bears, MD;  Location: [facility];  Service: Gastroenterology;  Laterality: N/A;  . FLEXIBLE SIGMOIDOSCOPY N/A 07/15/2017   Procedure: FLEXIBLE SIGMOIDOSCOPY;  Surgeon: Carol Ada, MD;  Location: [facility];  Service: Endoscopy;  Laterality: N/A;  . HEMORRHOID BANDING    . I&D EXTREMITY Left 06/01/2017   Procedure: IRRIGATION AND DEBRIDEMENT LEFT ARM HEMATOMA WITH LIGATION OF LEFT ARM AV FISTULA;  Surgeon: Elam Dutch, MD;  Location: [facility];  Service: Vascular;  Laterality: Left;  . I&D EXTREMITY Left 06/14/2017   Procedure: IRRIGATION AND DEBRIDEMENT EXTREMITY;  Surgeon: Katha Cabal, MD;  Location:  [facility] ORS;  Service: Vascular;  Laterality: Left;  . INSERTION OF DIALYSIS CATHETER  05/30/2017  . INSERTION OF DIALYSIS CATHETER N/A 05/30/2017   Procedure: INSERTION OF DIALYSIS CATHETER;  Surgeon: Elam Dutch, MD;  Location: [facility];  Service: Vascular;  Laterality: N/A;  . IR PARACENTESIS  08/30/2017  . IR PARACENTESIS  09/29/2017  . IR PARACENTESIS  10/28/2017  . IR PARACENTESIS  11/09/2017  . IR PARACENTESIS  11/16/2017  . IR PARACENTESIS  11/28/2017  . IR PARACENTESIS  12/01/2017  . IR PARACENTESIS  12/06/2017  . IR PARACENTESIS  01/03/2018  . IR PARACENTESIS  01/23/2018  . IR PARACENTESIS  02/07/2018  . IR PARACENTESIS  02/21/2018  . IR PARACENTESIS  03/06/2018  . IR PARACENTESIS  03/17/2018  . IR PARACENTESIS  04/04/2018  . IR PARACENTESIS  12/28/2018  . IR PARACENTESIS  01/08/2019  . IR PARACENTESIS  01/23/2019  . IR PARACENTESIS  02/01/2019  . IR PARACENTESIS  02/19/2019  . IR PARACENTESIS  03/01/2019  . IR PARACENTESIS  03/15/2019  . IR PARACENTESIS  04/03/2019  . IR PARACENTESIS  04/12/2019  . IR PARACENTESIS  05/01/2019  . IR PARACENTESIS  05/08/2019  . IR PARACENTESIS  05/24/2019  . IR RADIOLOGIST EVAL & MGMT  02/14/2018  . IR RADIOLOGIST EVAL & MGMT  02/22/2019  . LEFT HEART CATH AND CORONARY ANGIOGRAPHY N/A 02/22/2017   Procedure: LEFT HEART CATH AND CORONARY ANGIOGRAPHY;  Surgeon: Nigel Mormon, MD;  Location: [facility];  Service: Cardiovascular;  Laterality: N/A;  . LIGATION OF ARTERIOVENOUS  FISTULA Left 07/17/1094   Procedure: Plication of Left Arm Arteriovenous Fistula;  Surgeon: Elam Dutch, MD;  Location: [facility];  Service: Vascular;  Laterality: Left;  . POLYPECTOMY    . POLYPECTOMY  01/25/2018   Procedure: POLYPECTOMY;  Surgeon: Jerene Bears, MD;  Location: [facility];  Service: Gastroenterology;;  . REVISON OF ARTERIOVENOUS FISTULA Left 0/45/4098   Procedure: PLICATION OF DISTAL ANEURYSMAL SEGEMENT OF LEFT UPPER ARM ARTERIOVENOUS FISTULA;  Surgeon: Elam Dutch, MD;  Location: [facility];  Service: Vascular;  Laterality: Left;  . REVISON OF ARTERIOVENOUS FISTULA Left 07/30/1476   Procedure: Plication of Left Upper Arm Fistula ;  Surgeon: Waynetta Sandy, MD;  Location: [facility];  Service: Vascular;  Laterality: Left;  . SKIN GRAFT SPLIT THICKNESS LEG / FOOT Left    SKIN GRAFT SPLIT THICKNESS LEFT ARM DONOR SITE: LEFT ANTERIOR THIGH  . SKIN SPLIT GRAFT Left 07/04/2017   Procedure: SKIN GRAFT SPLIT THICKNESS LEFT ARM DONOR SITE: LEFT ANTERIOR  THIGH;  Surgeon: Elam Dutch, MD;  Location: [facility];  Service: Vascular;  Laterality: Left;  . THROMBECTOMY W/ EMBOLECTOMY Left 06/05/2017   Procedure: EXPLORATION OF LEFT ARM FOR BLEEDING; OVERSEWED PROXIMAL FISTULA;  Surgeon: Angelia Mould, MD;  Location: [facility];  Service: Vascular;  Laterality: Left;  . WOUND EXPLORATION Left 06/03/2017   Procedure: WOUND EXPLORATION WITH WOUND VAC APPLICATION TO LEFT ARM;  Surgeon: Angelia Mould, MD;  Location: [facility];  Service: Vascular;  Laterality: Left;        Home Medications    Prior to Admission medications   Medication Sig Start Date End Date Taking? Authorizing Provider  albuterol (VENTOLIN HFA) 108 (90 Base) MCG/ACT inhaler Inhale 2 puffs into the lungs every 6 (six) hours as needed for wheezing or shortness of breath.    [provider]  hydrALAZINE (APRESOLINE) 100 MG tablet Take 0.5 tablets (50 mg total) by mouth 3 (three) times daily. 05/31/19   Bloomfield, Carley D, DO  lactulose (CHRONULAC) 10 GM/15ML solution TAKE 45 MLS BY MOUTH 2 TIMES DAILY. 06/04/19   Al Decant, MD  metoprolol tartrate (LOPRESSOR) 100 MG tablet Take 1 tablet (100 mg total) by mouth 2 (two) times daily. 06/04/19   Al Decant, MD  montelukast (SINGULAIR) 10 MG tablet Take 10 mg by mouth every evening.     [provider]  ondansetron (ZOFRAN) 4 MG tablet Take 1 tablet (4 mg total) by mouth daily as needed for up to 10 days for nausea  or vomiting. 06/05/19 06/15/19  Maudie Mercury, MD  pantoprazole (PROTONIX) 40 MG tablet Take 1 tablet (40 mg total) by mouth daily. 06/05/19   Maudie Mercury, MD  sevelamer carbonate (RENVELA) 800 MG tablet Take 4 tablets with meals  And 2 tablets twice daily with snacks Patient taking differently: Take 1,600 mg by mouth See admin instructions. Take 2 tablets (1644m) with meals & with snacks 01/22/18   GPatrecia Pour MD  sulfamethoxazole-trimethoprim (BACTRIM DS) 800-160 MG tablet Take 1 tablet by mouth daily. Patient taking differently: Take 1 tablet by mouth 2 (two) times daily.  12/30/18   VGeradine Girt DO  SYMBICORT 160-4.5 MCG/ACT inhaler Inhale 2 puffs into the lungs 2 (two) times daily.  11/30/18   [provider]  zolpidem (AMBIEN) 10 MG tablet Take 10 mg by mouth at bedtime as needed for sleep.    [provider]    Family History Family History  Problem Relation Age of Onset  . Heart disease Mother   . Lung cancer Mother   . Heart disease Father   . Malignant hyperthermia Father   . COPD Father   . Throat cancer Sister   . Esophageal cancer Sister   . Hypertension Other   . COPD Other   . Colon cancer Neg Hx   . Colon polyps Neg Hx   . Rectal cancer Neg Hx   . Stomach cancer Neg Hx     Social History Social History   Tobacco Use  . Smoking status: Current Every Day Smoker    Packs/day: 0.50    Years: 43.00    Pack years: 21.50    Types: Cigarettes    Start date: 08/13/1973  . Smokeless tobacco: Never Used  . Tobacco comment: i dont know i just make   Substance Use Topics  . Alcohol use: Not Currently    Frequency: Never    Comment: quit drinking in 2017  . Drug use: Not Currently  Types: Marijuana, Cocaine    Comment: reports using once every 3 months, 04-06-2019 was this      Allergies   Morphine and related, Aspirin, Clonidine derivatives, Tramadol, and Tylenol [acetaminophen]   Review of Systems Review of Systems 10 Systems  reviewed and are negative for acute change except as noted in the HPI.  Physical Exam Updated Vital Signs BP (!) 141/65 (BP Location: Left Arm)   Pulse 64   Temp 98.5 F (36.9 C) (Oral)   Resp 18   SpO2 97%   Physical Exam Constitutional:      Comments: Alert and nontoxic.  No respiratory distress at rest.  Patient is very thin.  Eyes:     Extraocular Movements: Extraocular movements intact.  Cardiovascular:     Rate and Rhythm: Normal rate and regular rhythm.  Pulmonary:     Comments: No respiratory distress at rest.  Good airflow on the right.  Breath sounds sound diminished on the left base. Abdominal:     Comments: Abdomen has moderate distention.  It is however soft and pliable.  Not severely turgid.  I can palpate the liver margin low on the right lower quadrant.  Patient endorses some tenderness on the left but is not guarding.  Abdomen is not erythematous or warm to the touch.  Musculoskeletal: Normal range of motion.     Comments: No peripheral edema.  Skin:    General: Skin is warm and dry.  Neurological:     General: No focal deficit present.     Mental Status: He is oriented to person, place, and time.     Coordination: Coordination normal.  Psychiatric:        Mood and Affect: Mood normal.      ED Treatments / Results  Labs (all labs ordered are listed, but only abnormal results are displayed) Labs Reviewed  COMPREHENSIVE METABOLIC PANEL - Abnormal; Notable for the following components:      Result Value   Glucose, Bld 104 (*)    BUN 33 (*)    Creatinine, Ser 8.15 (*)    Albumin 3.3 (*)    Alkaline Phosphatase 129 (*)    GFR calc non Af Amer 7 (*)    GFR calc Af Amer 8 (*)    All other components within normal limits  CBC WITH DIFFERENTIAL/PLATELET - Abnormal; Notable for the following components:   RBC 3.43 (*)    Hemoglobin 9.5 (*)    HCT 26.8 (*)    MCV 78.1 (*)    RDW 16.3 (*)    All other components within normal limits  SARS CORONAVIRUS 2  (TAT 6-24 HRS)  LIPASE, BLOOD  ETHANOL  PROTIME-INR    EKG EKG Interpretation  Date/Time:  Thursday June 07 2019 16:14:58 EST Ventricular Rate:  58 PR Interval:    QRS Duration: 164 QT Interval:  505 QTC Calculation: 497 R Axis:   -36 Text Interpretation: Atrial fibrillation Left bundle branch block Old LBBB. no acute ischemic appearance compared tp previous Confirmed by Charlesetta Shanks [phone]) on 06/07/2019 4:44:00 PM   Radiology Dg Chest Port 1 View  Result Date: 06/07/2019 CLINICAL DATA:  Short of breath. EXAM: PORTABLE CHEST 1 VIEW COMPARISON:  06/02/2019 FINDINGS: Mild enlargement of the cardiopericardial silhouette is stable. No mediastinal or hilar masses. Prominent bronchovascular markings, stable. No evidence of pneumonia or pulmonary edema. No pleural effusion or pneumothorax. Skeletal structures are grossly intact. IMPRESSION: No acute cardiopulmonary disease. Electronically Signed   By: Lajean Manes  M.D.   On: 06/07/2019 17:34    Procedures .Paracentesis  Date/Time: 06/07/2019 9:50 PM Performed by: Charlesetta Shanks, MD Authorized by: Charlesetta Shanks, MD   Consent:    Consent obtained:  Verbal   Consent given by:  Patient   Risks discussed:  Bleeding, bowel perforation, infection and pain   Alternatives discussed:  Delayed treatment Pre-procedure details:    Procedure purpose:  Therapeutic   Preparation: Patient was prepped and draped in usual sterile fashion   Anesthesia (see MAR for exact dosages):    Anesthesia method:  Local infiltration   Local anesthetic:  Lidocaine 1% w/o epi Procedure details:    Ultrasound guidance: yes     Puncture site:  L lower quadrant   Fluid removed amount:  2800   Fluid appearance:  Amber   Dressing:  Adhesive bandage and 4x4 sterile gauze Post-procedure details:    Patient tolerance of procedure:  Tolerated well, no immediate complications Comments:     Paracentesis kit with catheter over needle used.  Large fluid  pocket identified by ultrasound.   (including critical care time)  Medications Ordered in ED Medications  oxyCODONE (Oxy IR/ROXICODONE) immediate release tablet 5 mg (5 mg Oral Given 06/07/19 1726)     Initial Impression / Assessment and Plan / ED Course  I have reviewed the triage vital signs and the nursing notes.  Pertinent labs & imaging results that were available during my care of the patient were reviewed by me and considered in my medical decision making (see chart for details).       Patient is alert and nontoxic.  He is not showing any signs of hepatic encephalopathy or spontaneous bacterial peritonitis.  Patient reports he feels like he needs a paracentesis.  His abdomen is moderately distended but soft and he is not having any respiratory distress.  I did offer to do this in the emergency department but also have been very busy with other patients department.  Patient does not wish to wait any longer for the procedure.  I have placed a outpatient request for interventional radiology.  He has had multiple paracentesis by IR.  He is also counseled to follow-up with his PCP to assist him in ongoing management.  No signs of significant volume overload due to kidney failure at this time.  He is on scheduled for his next dialysis session.  Patient changed his mind and decided to stay to complete paracentesis.  Procedure note as per above.  Patient tolerated without difficulty.  Felt much improved after the procedure.  Final Clinical Impressions(s) / ED Diagnoses   Final diagnoses:  ESRD on dialysis [facility])  Other ascites    ED Discharge Orders         Ordered    IR Paracentesis     06/07/19 1906           Charlesetta Shanks, MD 06/07/19 Remonia Richter    Charlesetta Shanks, MD 06/07/19 2152

## 2019-06-07 NOTE — ED Notes (Signed)
This patient refused the hospital gown at this time stating "all I need is for this fluid to be removed". Pt refused monitor attachment as well, re-directed to keep pulse oximetry r/t chief complaint of SOB.

## 2019-06-07 NOTE — ED Notes (Signed)
Pt stated he is not getting into a gown and refusing to stay on the cardiac monitor, he let this tech get EKG but then insisted that all of the cords were to come off and stay off because he already knows what is wrong with him and doesn't need unnecessary testing.

## 2019-06-08 ENCOUNTER — Emergency Department (HOSPITAL_COMMUNITY)
Admission: EM | Admit: 2019-06-08 | Discharge: 2019-06-08 | Disposition: A | Discharge status: Home / Self Care | Payer: Medicare Other | Source: Home / Self Care | Attending: Emergency Medicine | Admitting: Emergency Medicine

## 2019-06-08 ENCOUNTER — Encounter (HOSPITAL_COMMUNITY): Payer: Self-pay | Admitting: Pharmacy Technician

## 2019-06-08 ENCOUNTER — Other Ambulatory Visit: Payer: Self-pay

## 2019-06-08 ENCOUNTER — Emergency Department (HOSPITAL_COMMUNITY): Payer: Medicare Other

## 2019-06-08 DIAGNOSIS — R609 Edema, unspecified: Secondary | ICD-10-CM | POA: Diagnosis not present

## 2019-06-08 DIAGNOSIS — R188 Other ascites: Secondary | ICD-10-CM | POA: Diagnosis not present

## 2019-06-08 DIAGNOSIS — I517 Cardiomegaly: Secondary | ICD-10-CM | POA: Diagnosis not present

## 2019-06-08 DIAGNOSIS — R140 Abdominal distension (gaseous): Secondary | ICD-10-CM | POA: Insufficient documentation

## 2019-06-08 DIAGNOSIS — J449 Chronic obstructive pulmonary disease, unspecified: Secondary | ICD-10-CM | POA: Insufficient documentation

## 2019-06-08 DIAGNOSIS — R1084 Generalized abdominal pain: Secondary | ICD-10-CM | POA: Diagnosis not present

## 2019-06-08 DIAGNOSIS — I252 Old myocardial infarction: Secondary | ICD-10-CM | POA: Insufficient documentation

## 2019-06-08 DIAGNOSIS — R0602 Shortness of breath: Secondary | ICD-10-CM | POA: Diagnosis not present

## 2019-06-08 DIAGNOSIS — N186 End stage renal disease: Secondary | ICD-10-CM | POA: Insufficient documentation

## 2019-06-08 DIAGNOSIS — I120 Hypertensive chronic kidney disease with stage 5 chronic kidney disease or end stage renal disease: Secondary | ICD-10-CM | POA: Insufficient documentation

## 2019-06-08 DIAGNOSIS — R52 Pain, unspecified: Secondary | ICD-10-CM | POA: Diagnosis not present

## 2019-06-08 DIAGNOSIS — Z992 Dependence on renal dialysis: Secondary | ICD-10-CM | POA: Insufficient documentation

## 2019-06-08 DIAGNOSIS — F17210 Nicotine dependence, cigarettes, uncomplicated: Secondary | ICD-10-CM | POA: Insufficient documentation

## 2019-06-08 DIAGNOSIS — Z79899 Other long term (current) drug therapy: Secondary | ICD-10-CM | POA: Insufficient documentation

## 2019-06-08 LAB — COMPREHENSIVE METABOLIC PANEL
ALT: 27 U/L (ref 0–44)
AST: 35 U/L (ref 15–41)
Albumin: 3.4 g/dL — ABNORMAL LOW (ref 3.5–5.0)
Alkaline Phosphatase: 126 U/L (ref 38–126)
Anion gap: 15 (ref 5–15)
BUN: 44 mg/dL — ABNORMAL HIGH (ref 6–20)
CO2: 25 mmol/L (ref 22–32)
Calcium: 9.8 mg/dL (ref 8.9–10.3)
Chloride: 99 mmol/L (ref 98–111)
Creatinine, Ser: 9.62 mg/dL — ABNORMAL HIGH (ref 0.61–1.24)
GFR calc Af Amer: 6 mL/min — ABNORMAL LOW (ref >60)
GFR calc non Af Amer: 5 mL/min — ABNORMAL LOW (ref >60)
Glucose, Bld: 86 mg/dL (ref 70–99)
Potassium: 5.3 mmol/L — ABNORMAL HIGH (ref 3.5–5.1)
Sodium: 139 mmol/L (ref 135–145)
Total Bilirubin: 1 mg/dL (ref 0.3–1.2)
Total Protein: 6.2 g/dL — ABNORMAL LOW (ref 6.5–8.1)

## 2019-06-08 LAB — CBC WITH DIFFERENTIAL/PLATELET
Abs Immature Granulocytes: 0.05 10*3/uL (ref 0.00–0.07)
Basophils Absolute: 0.1 10*3/uL (ref 0.0–0.1)
Basophils Relative: 1 %
Eosinophils Absolute: 0.2 10*3/uL (ref 0.0–0.5)
Eosinophils Relative: 2 %
HCT: 25.2 % — ABNORMAL LOW (ref 39.0–52.0)
Hemoglobin: 8.8 g/dL — ABNORMAL LOW (ref 13.0–17.0)
Immature Granulocytes: 1 %
Lymphocytes Relative: 17 %
Lymphs Abs: 1.5 10*3/uL (ref 0.7–4.0)
MCH: 27.2 pg (ref 26.0–34.0)
MCHC: 34.9 g/dL (ref 30.0–36.0)
MCV: 78 fL — ABNORMAL LOW (ref 80.0–100.0)
Monocytes Absolute: 0.9 10*3/uL (ref 0.1–1.0)
Monocytes Relative: 10 %
Neutro Abs: 6.3 10*3/uL (ref 1.7–7.7)
Neutrophils Relative %: 69 %
Platelets: 179 10*3/uL (ref 150–400)
RBC: 3.23 MIL/uL — ABNORMAL LOW (ref 4.22–5.81)
RDW: 16.3 % — ABNORMAL HIGH (ref 11.5–15.5)
WBC: 9.1 10*3/uL (ref 4.0–10.5)
nRBC: 0 % (ref 0.0–0.2)

## 2019-06-08 LAB — LIPASE, BLOOD: Lipase: 29 U/L (ref 11–51)

## 2019-06-08 IMAGING — CR DG CHEST 2V
2 series · 2 of 2 positions shown · non-contrast
Comparison: 12/06/2017 and prior radiograph

CLINICAL DATA: Acute chest pain today.

EXAM:
CHEST - 2 VIEW

[chest lat]
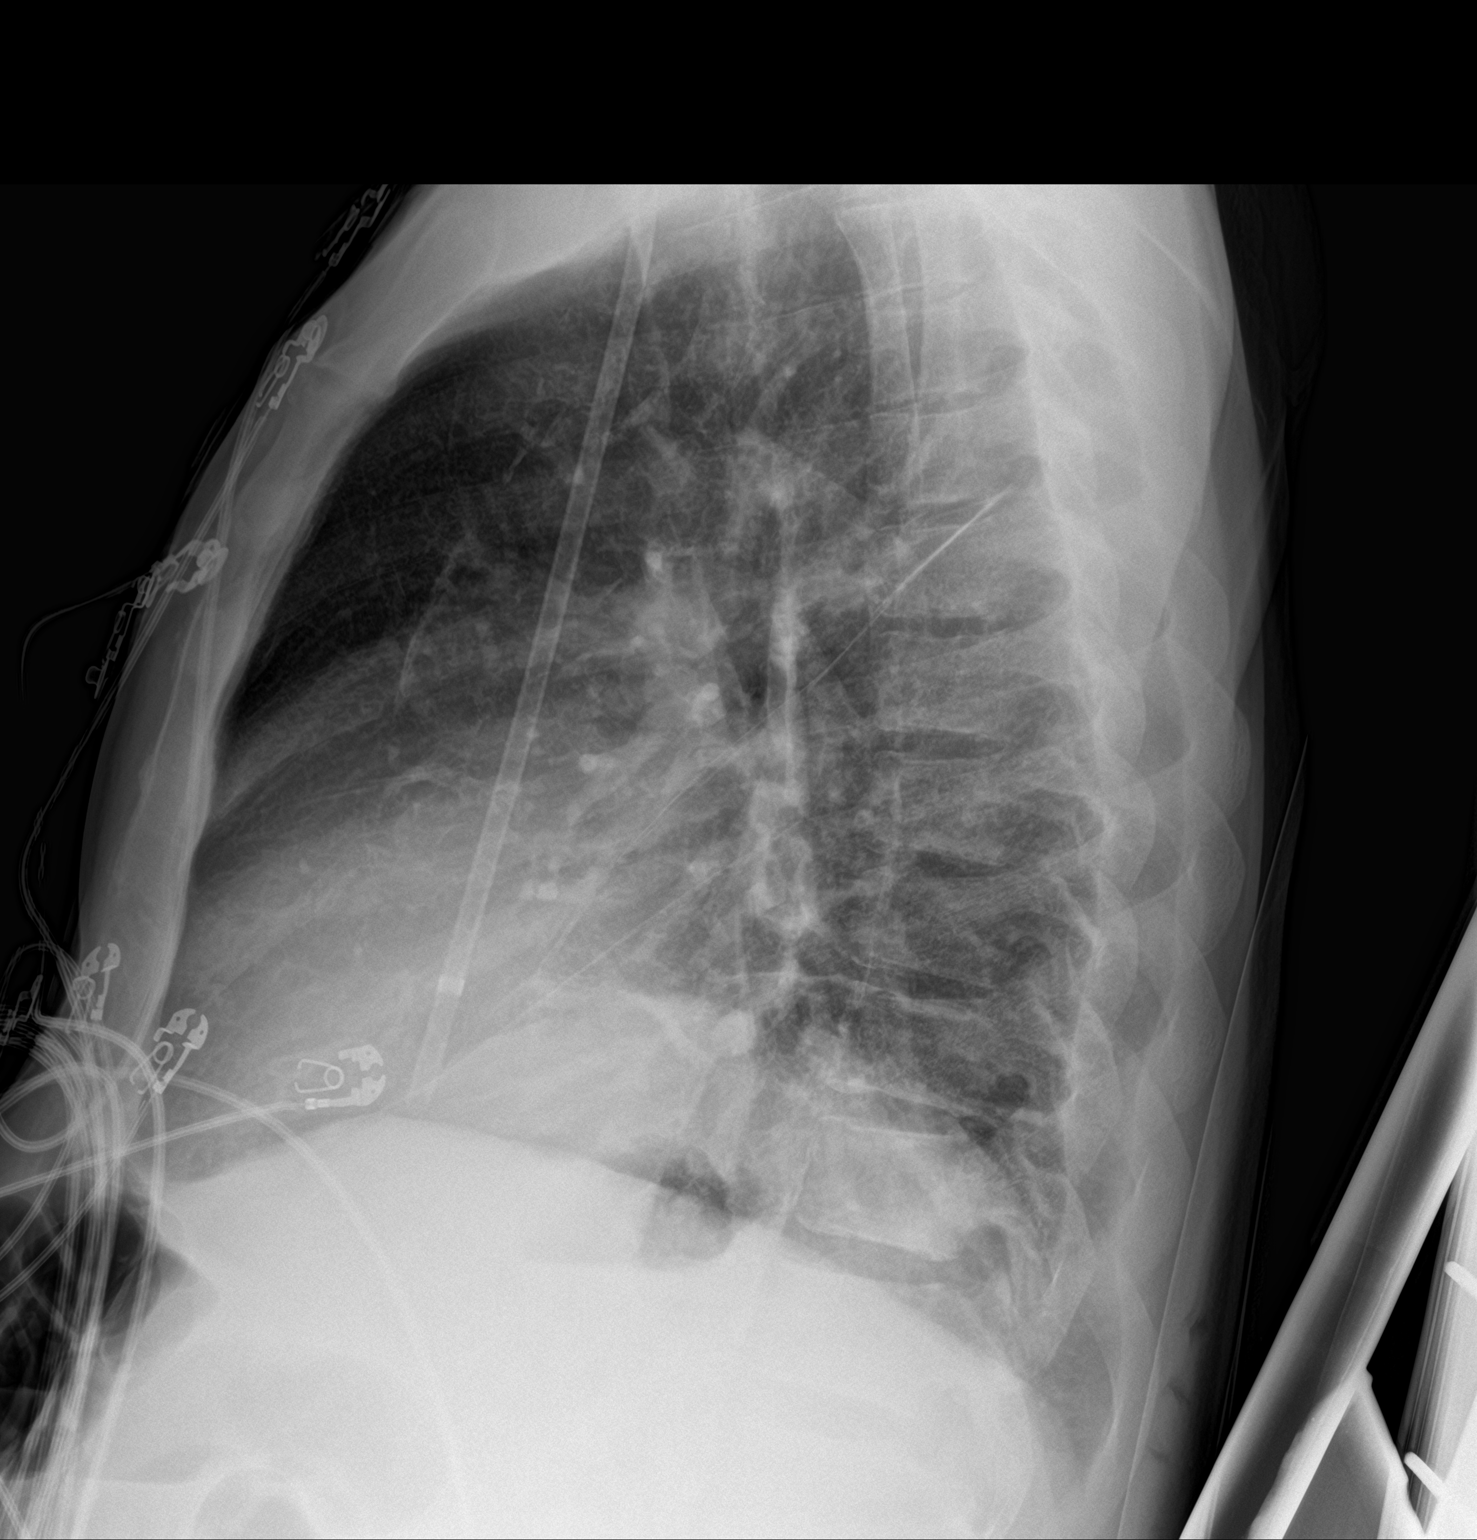

[chest ap]
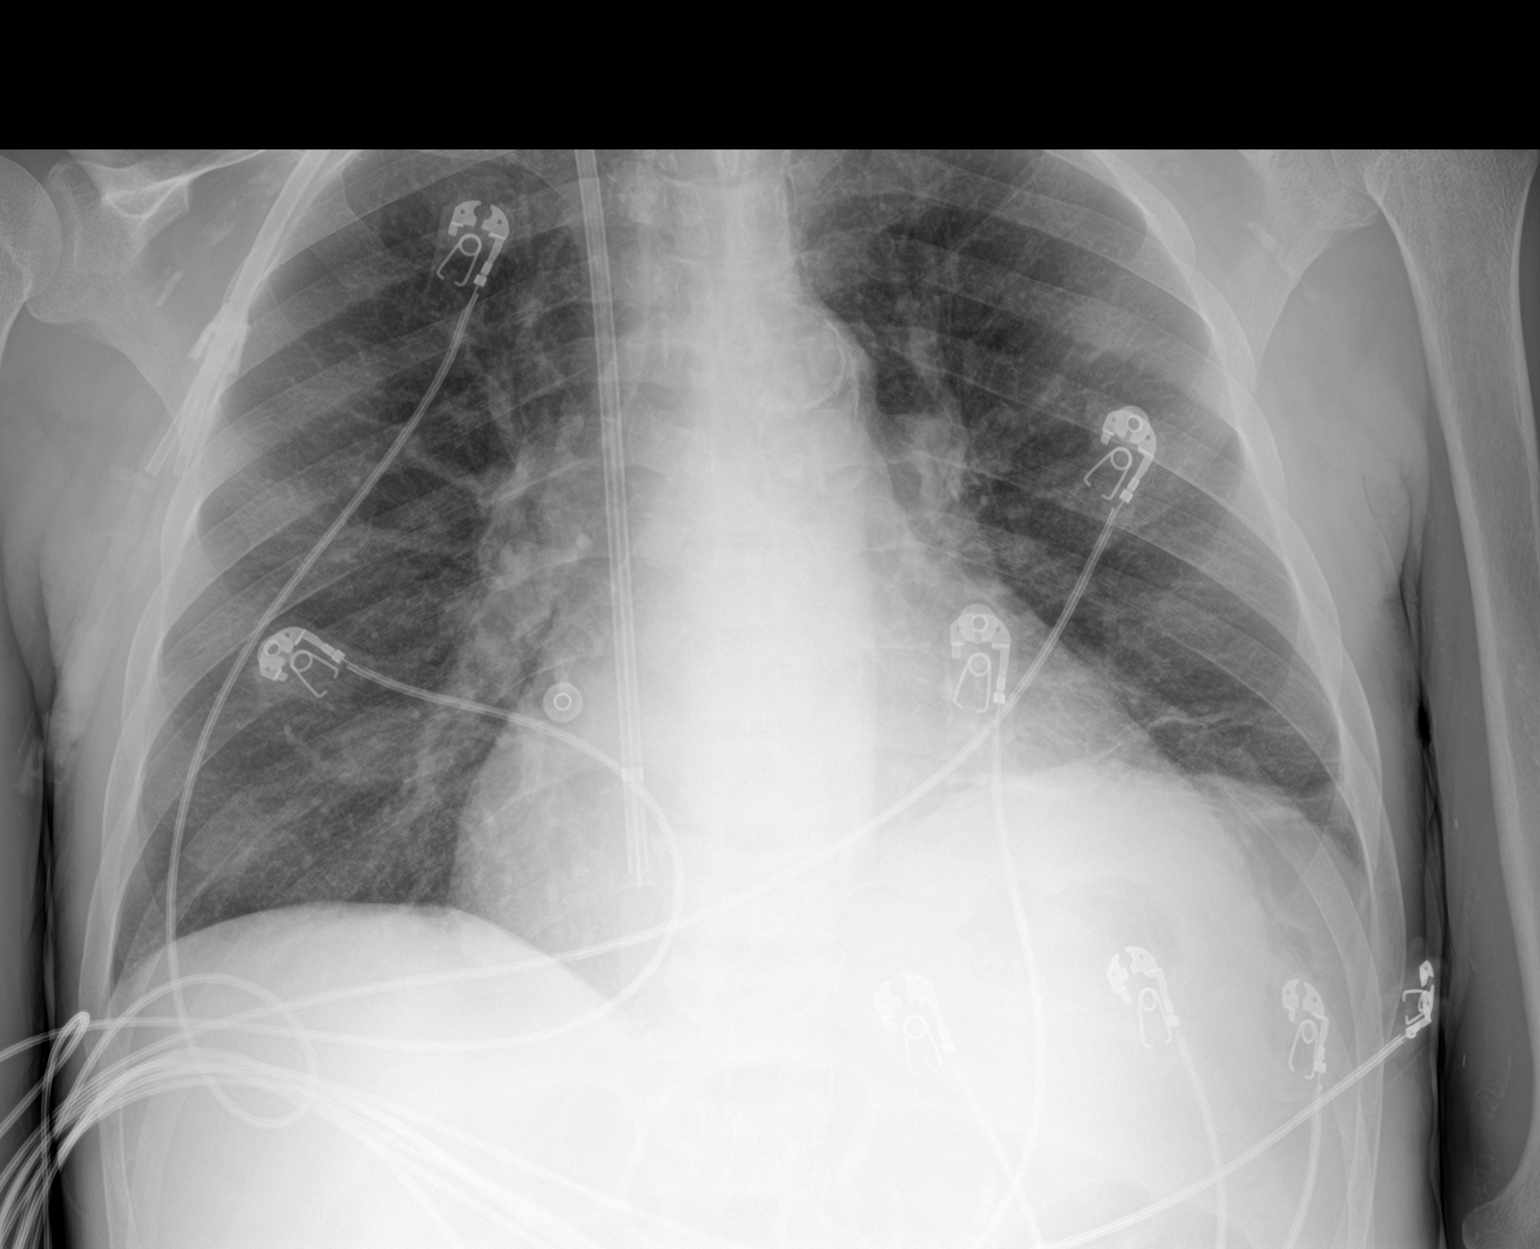

[2 of 2 positions shown; findings below may reference images not displayed]

FINDINGS: Moderate to marked cardiomegaly and pulmonary vascular congestion
again noted.

Mild LEFT basilar atelectasis/scarring is unchanged.

A RIGHT IJ central venous catheter with tips overlying the RIGHT
atrium again identified.

There is no evidence of airspace disease, pleural effusion or
pneumothorax.
IMPRESSION: Moderate to marked cardiomegaly with pulmonary vascular congestion.

Unchanged mild LEFT basilar atelectasis/scarring.

## 2019-06-08 IMAGING — CT CT ABD-PELV W/ CM
2 of 5 series · 16 of 46 positions shown, 18 images · IV contrast (APPLIED)
Comparison: 11/27/2017

CLINICAL DATA: abdominal pain/chest pain. Seen here yesterday for
the same. Pt had fluid drained off his abdomen. Pt began having abd
pain while at dialysis today and had felt "like an elephant was
sitting on his chest."

EXAM:
CT ABDOMEN AND PELVIS WITH CONTRAST
TECHNIQUE: Multidetector CT imaging of the abdomen and pelvis was performed
using the standard protocol following bolus administration of
intravenous contrast.
CONTRAST:  100mL OMNIPAQUE IOHEXOL 300 MG/ML  SOLN

[Series 3: abd/ pelvis 5.0 i30f 2 · axial · 0.84mm/px · z∈[-426,+28]mm · 13 of 101 slices shown, 15 images]
[im 5/101  soft-tissue]
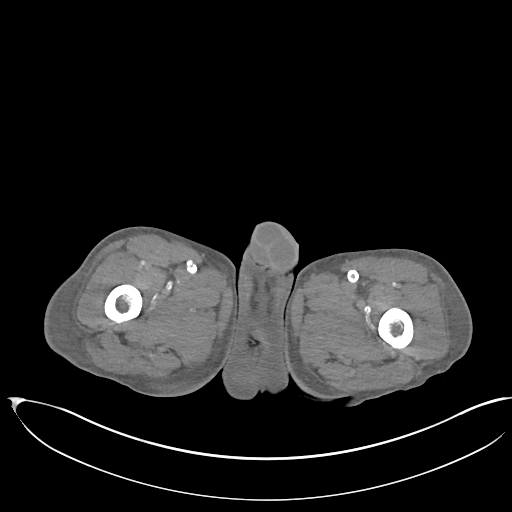
[im 5/101  bone]
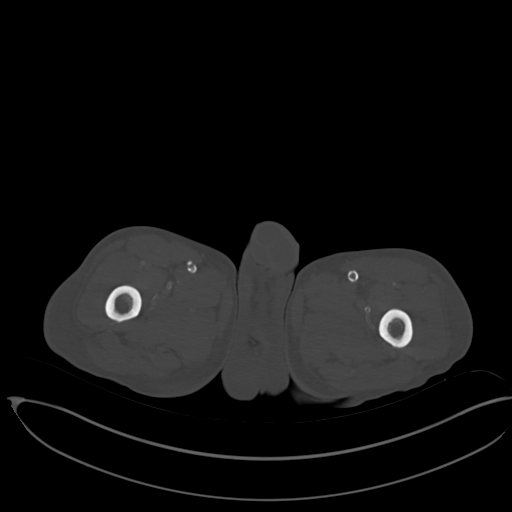
[im 15/101  soft-tissue]
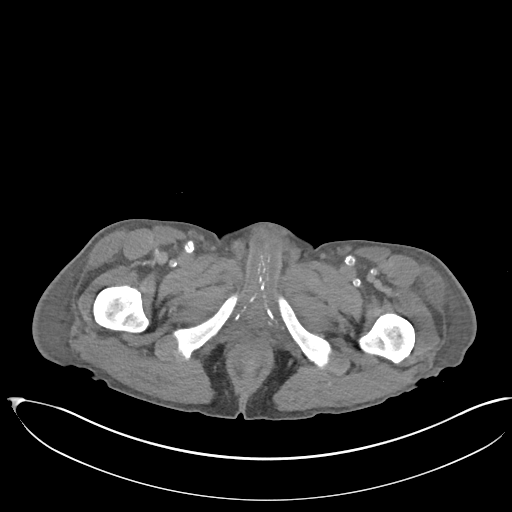
[im 20/101  soft-tissue]
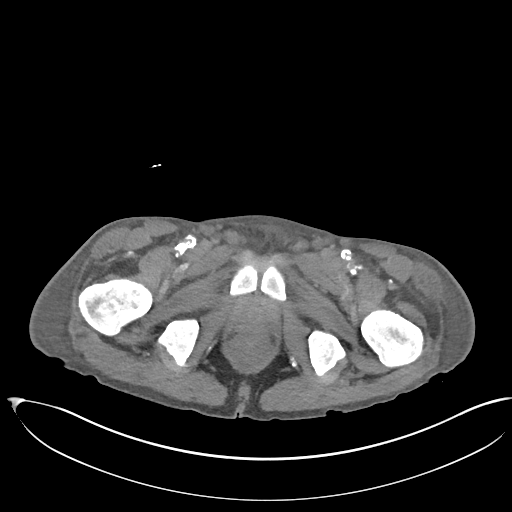
[im 29/101  soft-tissue]
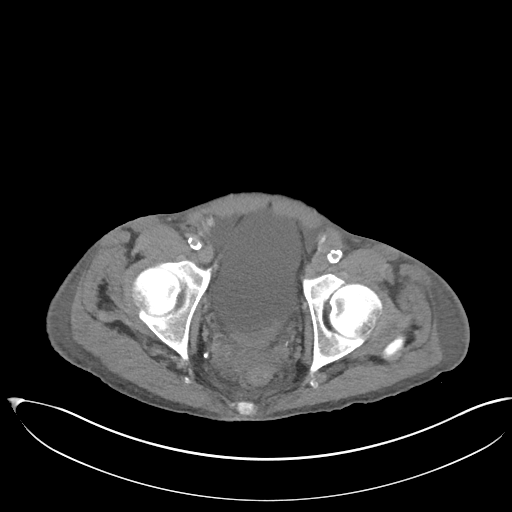
[im 34/101  soft-tissue]
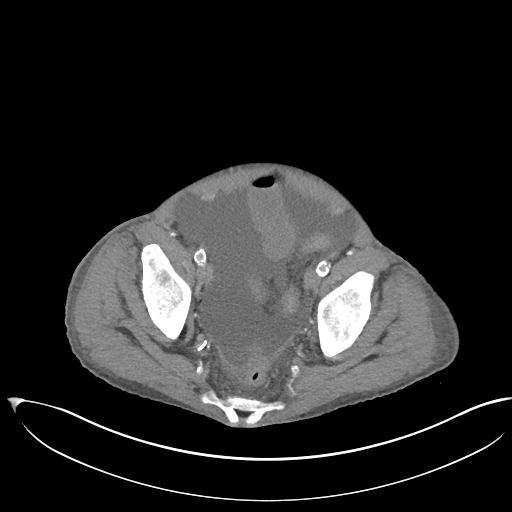
[im 43/101  soft-tissue]
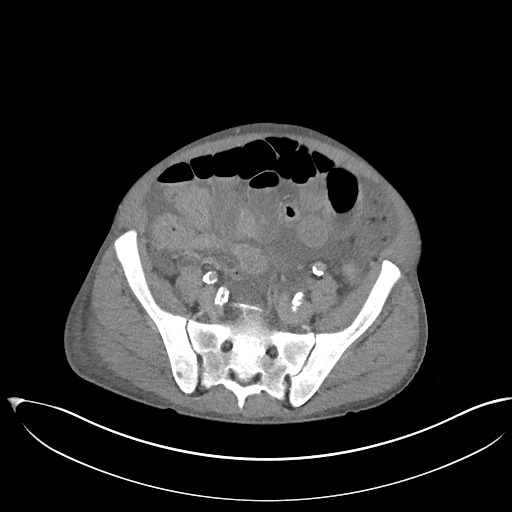
[im 53/101  soft-tissue]
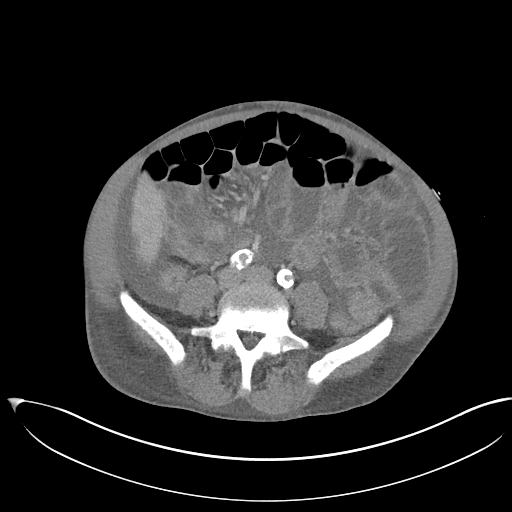
[im 58/101  soft-tissue]
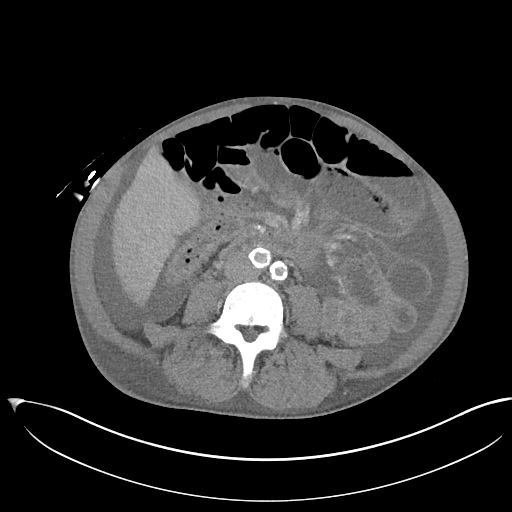
[im 67/101  soft-tissue]
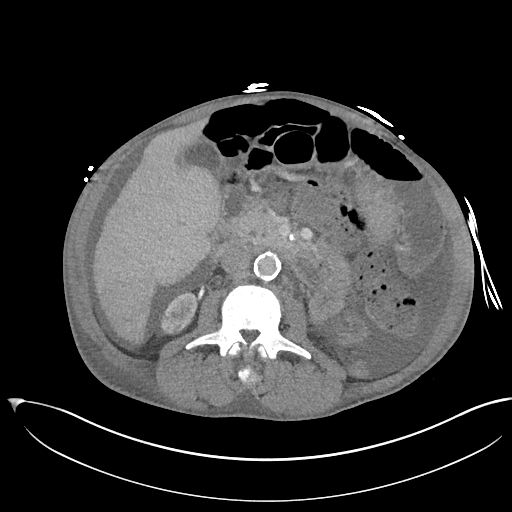
[im 67/101  bone]
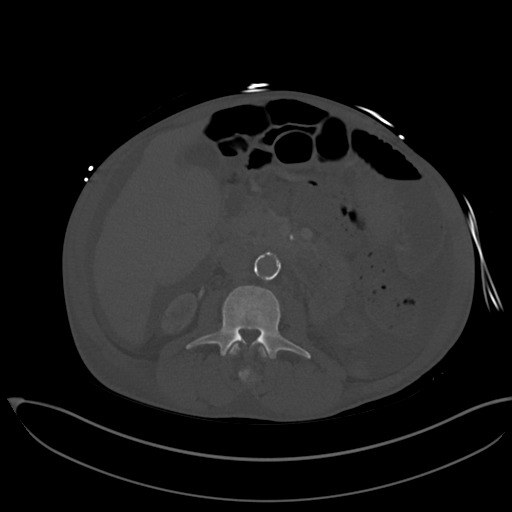
[im 72/101  soft-tissue]
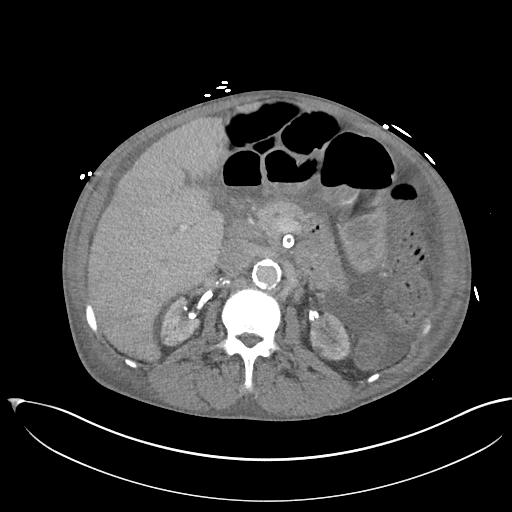
[im 81/101  soft-tissue]
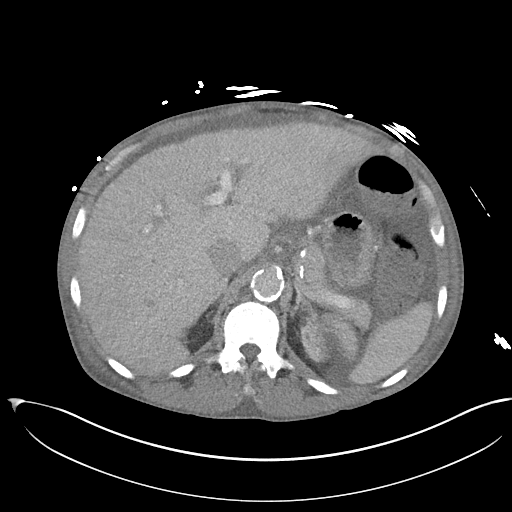
[im 86/101  soft-tissue]
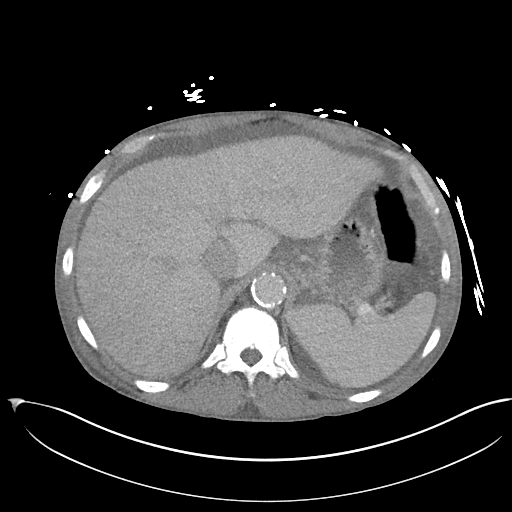
[im 96/101  soft-tissue]
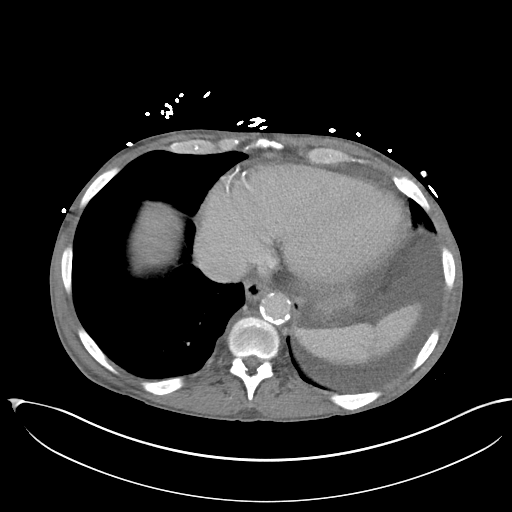

[Series 6: coronal soft tissue · coronal · 0.76mm/px · 3 of 98 slices shown]
[im 33/98  soft-tissue]
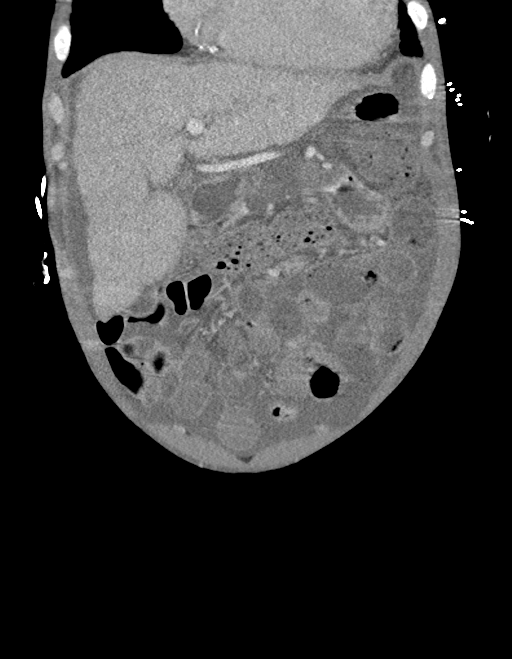
[im 44/98  soft-tissue]
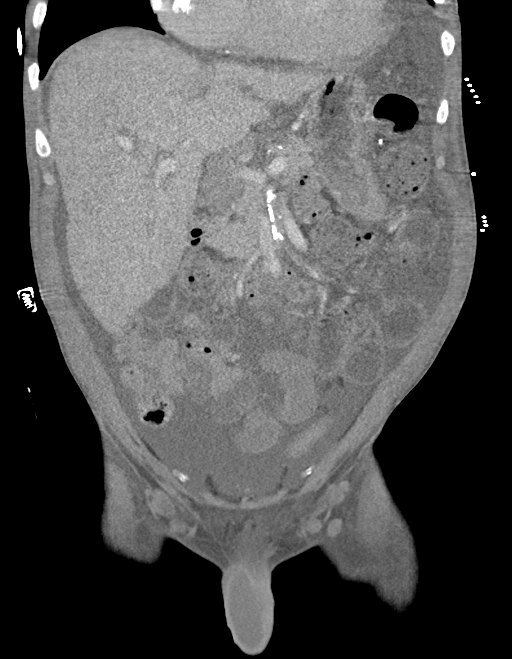
[im 54/98  soft-tissue]
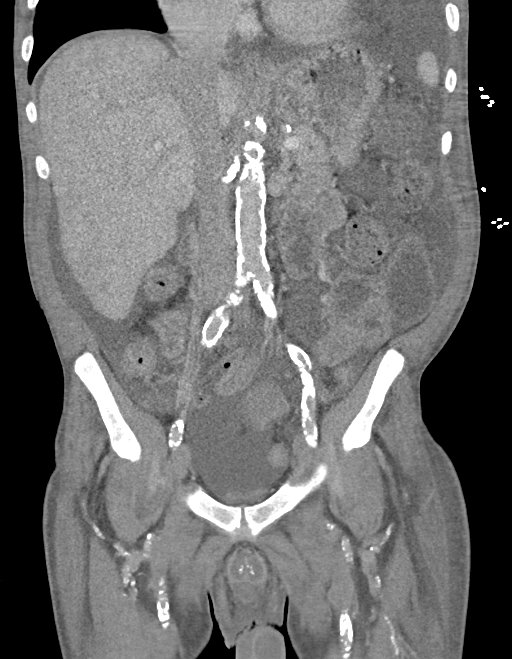

[16 of 46 positions shown; findings below may reference images not displayed]

FINDINGS: Lower chest: Coronary calcifications. Cardiomegaly. Small
pericardial effusion. No pleural effusion. Visualized lung bases
clear.

Hepatobiliary: Mildly nodular liver contour. No focal lesion.
Gallbladder unremarkable. No biliary ductal dilatation. Portal vein
patent.

Pancreas: Unremarkable. No pancreatic ductal dilatation or
surrounding inflammatory changes.

Spleen: Normal in size without focal abnormality.

Adrenals/Urinary Tract: Normal adrenals. Bilateral renal atrophy
with small low-attenuation lesions up to 1 cm. Heavy renal arterial
calcifications. No hydronephrosis. Urinary bladder decompressed.

Stomach/Bowel: Stomach is nondistended. Multiple fluid distended
small bowel loops. Appendix not discretely identified. The colon is
nondilated, unremarkable.

Vascular/Lymphatic: Heavy aortic and branch vessel atheromatous
calcifications without aneurysm. No abdominal or pelvic adenopathy
localized.

Reproductive: Prostate is unremarkable.

Other: Stable  moderate pelvic and abdominal ascites.  No free air.

Musculoskeletal: Sclerotic changes of renal osteodystrophy in the
pelvis and lumbosacral spine. Degenerative disc disease L5-S1.
Negative for fracture or worrisome bone lesion.
IMPRESSION: 1. No acute findings.
2. Cirrhosis with stable abdominal ascites since prior study.
3. Coronary and aortoiliac atherosclerosis.

## 2019-06-08 MED ORDER — OXYCODONE HCL 5 MG PO TABS
5.0000 mg | ORAL_TABLET | Freq: Once | ORAL | Status: AC
  Administered 2019-06-08: 5 mg via ORAL
  Filled 2019-06-08: qty 1

## 2019-06-08 NOTE — ED Notes (Signed)
Pt refused gown and heart monitor/EKG at this time.

## 2019-06-08 NOTE — ED Notes (Signed)
Patients niece called by RN and states she will be picking patient up.

## 2019-06-08 NOTE — ED Triage Notes (Signed)
Pt bib ems from home with reports of needing another paracentesis. Just had one done yesterday here where he states they removed a little over 2L. Pt reports he does not feel like they removed enough fluid. Pt did not go to dialysis today because of the abd pain. Pt MWF dialysis. Last dialysis Tuesday. 122/70, HR 602, RR 26, 97% RA.

## 2019-06-08 NOTE — ED Provider Notes (Signed)
[facility] EMERGENCY DEPARTMENT Provider Note   CSN: [id number] Arrival date & time: 06/08/19  1539     History   Chief Complaint Chief Complaint  Patient presents with  . Abdominal Pain    HPI [name] is a 58 y.o. male.     HPI  Pt is a 58 year old male with PMH of ESRD (HD on MWF), hepatitis C, chronic hepatitis B, cirrhosis, COPD, CAD status post PCI, hypertension, history of A. fib (metoprolol) who presents to the ED with concern for abdominal pain and distention.  Patient reports he routinely gets paracentesis as an outpatient.  He reports he was seen in the emergency room yesterday and had 2 L drained but it was not enough and he continues to have abdominal discomfort and swelling.  He denies any new or worsening pain.  Patient states because of the abdominal discomfort he is experiencing some shortness of breath.  Patient last receive dialysis on Tuesday and did not get today as scheduled.  No fever or infectious symptoms.  Patient is not had any nausea, vomiting.  He denies any chest pain.  No recent illness.  Past Medical History:  Diagnosis Date  . Anemia   . Anxiety   . Arthritis    left shoulder  . Atherosclerosis of aorta ([place])   . Cardiomegaly   . Chest pain    DATE UNKNOWN, C/O PERIODICALLY  . Cocaine abuse ([place])   . COPD exacerbation ([place]) 08/17/2016  . Coronary artery disease    stent 02/22/17  . ESRD (end stage renal disease) on dialysis ([place])    "[name]; MWF" (07/04/2017)  . GERD (gastroesophageal reflux disease)    DATE UNKNOWN  . Hemorrhoids   . Hepatitis B, chronic ([place])   . Hepatitis C   . History of kidney stones   . Hyperkalemia   . Hypertension   . Kidney failure   . Metabolic bone disease    Patient denies  . Mitral stenosis   . Myocardial infarction ([place])   . Pneumonia   . Pulmonary edema   . Solitary rectal ulcer syndrome 07/2017   at [facility] for rectal bleeding  . Tubular adenoma of colon     Patient Active Problem List   Diagnosis Date Noted  . Acquired thrombophilia ([place]) 06/05/2019  . A-fib ([place]) 05/30/2019  . Atrial fibrillation with RVR ([place]) 05/29/2019  . Melena   . Pressure injury of skin 03/09/2019  . Abdominal distention   . Volume overload 12/28/2018  . Sepsis ([place]) 09/12/2018  . Atherosclerosis of native coronary artery of native heart without angina pectoris 03/11/2018  . Benign neoplasm of cecum   . Benign neoplasm of ascending colon   . Benign neoplasm of descending colon   . Benign neoplasm of rectum   . Paroxysmal atrial fibrillation ([place]) 01/23/2018  . Hx of colonic polyps 01/20/2018  . ESRD (end stage renal disease) ([place]) 11/21/2017  . GERD (gastroesophageal reflux disease) 11/16/2017  . Liver cirrhosis ([place]) 11/15/2017  . DNR (do not resuscitate)   . Palliative care by specialist   . Hyponatremia 11/04/2017  . SBP (spontaneous bacterial peritonitis) ([place]) 10/30/2017  . Liver disease, chronic 10/30/2017  . SOB (shortness of breath)   . Abdominal pain 10/28/2017  . Upper airway cough syndrome with flattening on f/v loop 10/13/17 c/w vcd 10/17/2017  . Elevated diaphragm 10/13/2017  . Ileus ([place]) 09/29/2017  . Malnutrition of moderate degree 09/29/2017  . Sinus congestion  09/03/2017  . Symptomatic anemia 09/02/2017  . Other cirrhosis of liver ([place]) 09/02/2017  . Left bundle branch block 09/02/2017  . Mitral stenosis 09/02/2017  . Hematochezia 07/15/2017  . Wide-complex tachycardia ([place])   . Endotracheally intubated   . ESRD on dialysis ([place]) 07/04/2017  . Acute respiratory failure with hypoxia ([place]) 06/18/2017  . CKD (chronic kidney disease) stage V requiring chronic dialysis ([place]) 06/18/2017  . History of Cocaine abuse ([place]) 06/18/2017  . Hypertension 06/18/2017  . Infection of AV graft for dialysis ([place]) 06/18/2017  . Anxiety 06/18/2017  . Anemia due to chronic kidney disease 06/18/2017  . Atypical atrial flutter ([place]) 06/18/2017  .  Personality disorder ([place]) 06/13/2017  . Cellulitis 06/12/2017  . Adjustment disorder with mixed anxiety and depressed mood 06/10/2017  . Suicidal ideation 06/10/2017  . Arm wound, left, sequela 06/10/2017  . Dyspnea on exertion 05/29/2017  . Tachycardia 05/29/2017  . Hyperkalemia 05/22/2017  . Acute metabolic encephalopathy   . Anemia 04/23/2017  . Ascites 04/23/2017  . COPD (chronic obstructive pulmonary disease) ([place]) 04/23/2017  . Acute on chronic respiratory failure with hypoxia ([place]) 03/25/2017  . Arrhythmia 03/25/2017  . COPD GOLD 0 with flattening on inps f/v  09/27/2016  . Essential hypertension 09/27/2016  . Fluid overload 08/30/2016  . COPD exacerbation ([place]) 08/17/2016  . Hypertensive urgency 08/17/2016  . Respiratory failure ([place]) 08/17/2016  . Problem with dialysis access ([place]) 07/23/2016  . Chronic hepatitis B ([place]) 03/05/2014  . Chronic hepatitis C without hepatic coma ([place]) 03/05/2014  . Internal hemorrhoids with bleeding, swelling and itching 03/05/2014  . Thrombocytopenia ([place]) 03/05/2014  . Chest pain 02/27/2014  . Alcohol abuse 04/14/2009  . Cigarette smoker 04/14/2009  . GANGLION CYST 04/14/2009    Past Surgical History:  Procedure Laterality Date  . A/V FISTULAGRAM Left 05/26/2017   Procedure: A/V FISTULAGRAM;  Surgeon: Conrad Maunabo, MD;  Location: [facility];  Service: Cardiovascular;  Laterality: Left;  . A/V FISTULAGRAM Right 11/18/2017   Procedure: A/V FISTULAGRAM - Right Arm;  Surgeon: Elam Dutch, MD;  Location: [facility];  Service: Cardiovascular;  Laterality: Right;  . APPLICATION OF WOUND VAC Left 06/14/2017   Procedure: APPLICATION OF WOUND VAC;  Surgeon: Katha Cabal, MD;  Location: [facility];  Service: Vascular;  Laterality: Left;  . AV FISTULA PLACEMENT  2012   BELIEVED WAS PLACED IN JUNE  . AV FISTULA PLACEMENT Right 08/09/2017   Procedure: Creation Right arm ARTERIOVENOUS BRACHIOCEPOHALIC FISTULA;  Surgeon:  Elam Dutch, MD;  Location: [facility] OR;  Service: Vascular;  Laterality: Right;  . AV FISTULA PLACEMENT Right 11/22/2017   Procedure: INSERTION OF ARTERIOVENOUS (AV) GORE-TEX GRAFT RIGHT UPPER ARM;  Surgeon: Elam Dutch, MD;  Location: [facility];  Service: Vascular;  Laterality: Right;  . BIOPSY  01/25/2018   Procedure: BIOPSY;  Surgeon: Jerene Bears, MD;  Location: [facility];  Service: Gastroenterology;;  . BIOPSY  04/10/2019   Procedure: BIOPSY;  Surgeon: Jerene Bears, MD;  Location: [facility];  Service: Gastroenterology;;  . COLONOSCOPY    . COLONOSCOPY WITH PROPOFOL N/A 01/25/2018   Procedure: COLONOSCOPY WITH PROPOFOL;  Surgeon: Jerene Bears, MD;  Location: [facility];  Service: Gastroenterology;  Laterality: N/A;  . CORONARY STENT INTERVENTION N/A 02/22/2017   Procedure: CORONARY STENT INTERVENTION;  Surgeon: Nigel Mormon, MD;  Location: [facility];  Service: Cardiovascular;  Laterality: N/A;  . ESOPHAGOGASTRODUODENOSCOPY (EGD) WITH PROPOFOL N/A 01/25/2018  Procedure: ESOPHAGOGASTRODUODENOSCOPY (EGD) WITH PROPOFOL;  Surgeon: Jerene Bears, MD;  Location: [facility];  Service: Gastroenterology;  Laterality: N/A;  . ESOPHAGOGASTRODUODENOSCOPY (EGD) WITH PROPOFOL N/A 04/10/2019   Procedure: ESOPHAGOGASTRODUODENOSCOPY (EGD) WITH PROPOFOL;  Surgeon: Jerene Bears, MD;  Location: [facility];  Service: Gastroenterology;  Laterality: N/A;  . FLEXIBLE SIGMOIDOSCOPY N/A 07/15/2017   Procedure: FLEXIBLE SIGMOIDOSCOPY;  Surgeon: Carol Ada, MD;  Location: [facility];  Service: Endoscopy;  Laterality: N/A;  . HEMORRHOID BANDING    . I&D EXTREMITY Left 06/01/2017   Procedure: IRRIGATION AND DEBRIDEMENT LEFT ARM HEMATOMA WITH LIGATION OF LEFT ARM AV FISTULA;  Surgeon: Elam Dutch, MD;  Location: [facility];  Service: Vascular;  Laterality: Left;  . I&D EXTREMITY Left 06/14/2017   Procedure: IRRIGATION AND DEBRIDEMENT EXTREMITY;  Surgeon: Katha Cabal, MD;  Location:  [facility];  Service: Vascular;  Laterality: Left;  . INSERTION OF DIALYSIS CATHETER  05/30/2017  . INSERTION OF DIALYSIS CATHETER N/A 05/30/2017   Procedure: INSERTION OF DIALYSIS CATHETER;  Surgeon: Elam Dutch, MD;  Location: [facility];  Service: Vascular;  Laterality: N/A;  . IR PARACENTESIS  08/30/2017  . IR PARACENTESIS  09/29/2017  . IR PARACENTESIS  10/28/2017  . IR PARACENTESIS  11/09/2017  . IR PARACENTESIS  11/16/2017  . IR PARACENTESIS  11/28/2017  . IR PARACENTESIS  12/01/2017  . IR PARACENTESIS  12/06/2017  . IR PARACENTESIS  01/03/2018  . IR PARACENTESIS  01/23/2018  . IR PARACENTESIS  02/07/2018  . IR PARACENTESIS  02/21/2018  . IR PARACENTESIS  03/06/2018  . IR PARACENTESIS  03/17/2018  . IR PARACENTESIS  04/04/2018  . IR PARACENTESIS  12/28/2018  . IR PARACENTESIS  01/08/2019  . IR PARACENTESIS  01/23/2019  . IR PARACENTESIS  02/01/2019  . IR PARACENTESIS  02/19/2019  . IR PARACENTESIS  03/01/2019  . IR PARACENTESIS  03/15/2019  . IR PARACENTESIS  04/03/2019  . IR PARACENTESIS  04/12/2019  . IR PARACENTESIS  05/01/2019  . IR PARACENTESIS  05/08/2019  . IR PARACENTESIS  05/24/2019  . IR RADIOLOGIST EVAL & MGMT  02/14/2018  . IR RADIOLOGIST EVAL & MGMT  02/22/2019  . LEFT HEART CATH AND CORONARY ANGIOGRAPHY N/A 02/22/2017   Procedure: LEFT HEART CATH AND CORONARY ANGIOGRAPHY;  Surgeon: Nigel Mormon, MD;  Location: [facility];  Service: Cardiovascular;  Laterality: N/A;  . LIGATION OF ARTERIOVENOUS  FISTULA Left 09/10/5174   Procedure: Plication of Left Arm Arteriovenous Fistula;  Surgeon: Elam Dutch, MD;  Location: [facility];  Service: Vascular;  Laterality: Left;  . POLYPECTOMY    . POLYPECTOMY  01/25/2018   Procedure: POLYPECTOMY;  Surgeon: Jerene Bears, MD;  Location: [facility];  Service: Gastroenterology;;  . REVISON OF ARTERIOVENOUS FISTULA Left 1/60/7371   Procedure: PLICATION OF DISTAL ANEURYSMAL SEGEMENT OF LEFT UPPER ARM ARTERIOVENOUS FISTULA;  Surgeon: Elam Dutch, MD;  Location: [facility];  Service: Vascular;  Laterality: Left;  . REVISON OF ARTERIOVENOUS FISTULA Left 0/62/6948   Procedure: Plication of Left Upper Arm Fistula ;  Surgeon: Waynetta Sandy, MD;  Location: [facility];  Service: Vascular;  Laterality: Left;  . SKIN GRAFT SPLIT THICKNESS LEG / FOOT Left    SKIN GRAFT SPLIT THICKNESS LEFT ARM DONOR SITE: LEFT ANTERIOR THIGH  . SKIN SPLIT GRAFT Left 07/04/2017   Procedure: SKIN GRAFT SPLIT THICKNESS LEFT ARM DONOR SITE: LEFT ANTERIOR THIGH;  Surgeon: Elam Dutch, MD;  Location: [facility];  Service: Vascular;  Laterality: Left;  . THROMBECTOMY W/ EMBOLECTOMY Left 06/05/2017   Procedure: EXPLORATION OF LEFT ARM FOR BLEEDING; OVERSEWED PROXIMAL FISTULA;  Surgeon: Angelia Mould, MD;  Location: [facility];  Service: Vascular;  Laterality: Left;  . WOUND EXPLORATION Left 06/03/2017   Procedure: WOUND EXPLORATION WITH WOUND VAC APPLICATION TO LEFT ARM;  Surgeon: Angelia Mould, MD;  Location: [facility];  Service: Vascular;  Laterality: Left;        Home Medications    Prior to Admission medications   Medication Sig Start Date End Date Taking? Authorizing Provider  albuterol (VENTOLIN HFA) 108 (90 Base) MCG/ACT inhaler Inhale 2 puffs into the lungs every 6 (six) hours as needed for wheezing or shortness of breath.    [provider]  hydrALAZINE (APRESOLINE) 100 MG tablet Take 0.5 tablets (50 mg total) by mouth 3 (three) times daily. 05/31/19   [place], Carley D, DO  lactulose (CHRONULAC) 10 GM/15ML solution TAKE 45 MLS BY MOUTH 2 TIMES DAILY. 06/04/19   Al Decant, MD  metoprolol tartrate (LOPRESSOR) 100 MG tablet Take 1 tablet (100 mg total) by mouth 2 (two) times daily. 06/04/19   Al Decant, MD  montelukast (SINGULAIR) 10 MG tablet Take 10 mg by mouth every evening.     [provider]  ondansetron (ZOFRAN) 4 MG tablet Take 1 tablet (4 mg total) by mouth daily as needed for up to 10 days for nausea  or vomiting. 06/05/19 06/15/19  Maudie Mercury, MD  pantoprazole (PROTONIX) 40 MG tablet Take 1 tablet (40 mg total) by mouth daily. 06/05/19   Maudie Mercury, MD  sevelamer carbonate (RENVELA) 800 MG tablet Take 4 tablets with meals  And 2 tablets twice daily with snacks Patient taking differently: Take 1,600 mg by mouth See admin instructions. Take 2 tablets (1600mg ) with meals & with snacks 01/22/18   Patrecia Pour, MD  sulfamethoxazole-trimethoprim (BACTRIM DS) 800-160 MG tablet Take 1 tablet by mouth daily. Patient taking differently: Take 1 tablet by mouth 2 (two) times daily.  12/30/18   Geradine Girt, DO  SYMBICORT 160-4.5 MCG/ACT inhaler Inhale 2 puffs into the lungs 2 (two) times daily.  11/30/18   [provider]  zolpidem (AMBIEN) 10 MG tablet Take 10 mg by mouth at bedtime as needed for sleep.    [provider]    Family History Family History  Problem Relation Age of Onset  . Heart disease Mother   . Lung cancer Mother   . Heart disease Father   . Malignant hyperthermia Father   . COPD Father   . Throat cancer Sister   . Esophageal cancer Sister   . Hypertension Other   . COPD Other   . Colon cancer Neg Hx   . Colon polyps Neg Hx   . Rectal cancer Neg Hx   . Stomach cancer Neg Hx     Social History Social History   Tobacco Use  . Smoking status: Current Every Day Smoker    Packs/day: 0.50    Years: 43.00    Pack years: 21.50    Types: Cigarettes    Start date: 08/13/1973  . Smokeless tobacco: Never Used  . Tobacco comment: i dont know i just make   Substance Use Topics  . Alcohol use: Not Currently    Frequency: Never    Comment: quit drinking in 2017  . Drug use: Not Currently    Types: Marijuana, Cocaine    Comment: reports using once every 3 months,  04-06-2019 was this      Allergies   Morphine and related, Aspirin, Clonidine derivatives, Tramadol, and Tylenol [acetaminophen]   Review of Systems Review of Systems   Constitutional: Negative for chills and fever.  HENT: Negative for congestion.   Respiratory: Positive for shortness of breath. Negative for cough.   Cardiovascular: Negative for chest pain.  Gastrointestinal: Positive for abdominal distention and abdominal pain. Negative for diarrhea, nausea and vomiting.  Skin: Negative for rash.  Neurological: Negative for weakness and headaches.     Physical Exam Updated Vital Signs BP 128/65   Pulse 62   Temp 97.9 F (36.6 C) (Oral)   Resp 20   SpO2 98%   Physical Exam Vitals signs and nursing note reviewed.  Constitutional:      Appearance: He is well-developed.  HENT:     Head: Normocephalic and atraumatic.  Eyes:     Conjunctiva/sclera: Conjunctivae normal.  Neck:     Musculoskeletal: Neck supple.  Cardiovascular:     Rate and Rhythm: Normal rate and regular rhythm.     Heart sounds: No murmur.  Pulmonary:     Effort: Pulmonary effort is normal. No respiratory distress.     Breath sounds: Normal breath sounds.  Abdominal:     General: There is distension.     Palpations: Abdomen is soft.     Tenderness: There is no abdominal tenderness.     Comments: +hepatomegaly   Musculoskeletal:     Right lower leg: No edema.     Left lower leg: No edema.     Comments: R AVF with palpable pulse   Skin:    General: Skin is warm and dry.  Neurological:     General: No focal deficit present.     Mental Status: He is alert.      ED Treatments / Results  Labs (all labs ordered are listed, but only abnormal results are displayed) Labs Reviewed  CBC WITH DIFFERENTIAL/PLATELET - Abnormal; Notable for the following components:      Result Value   RBC 3.23 (*)    Hemoglobin 8.8 (*)    HCT 25.2 (*)    MCV 78.0 (*)    RDW 16.3 (*)    All other components within normal limits  COMPREHENSIVE METABOLIC PANEL - Abnormal; Notable for the following components:   Potassium 5.3 (*)    BUN 44 (*)    Creatinine, Ser 9.62 (*)    Total  Protein 6.2 (*)    Albumin 3.4 (*)    GFR calc non Af Amer 5 (*)    GFR calc Af Amer 6 (*)    All other components within normal limits  LIPASE, BLOOD    EKG None  Radiology Dg Chest Portable 1 View  Result Date: 06/08/2019 CLINICAL DATA:  Patient with abdominal distension. EXAM: PORTABLE CHEST 1 VIEW COMPARISON:  Chest radiograph 06/07/2019 FINDINGS: Stable cardiomegaly. No large area of pulmonary consolidation. No pleural effusion or pneumothorax. Regional skeleton is unremarkable. IMPRESSION: No acute cardiopulmonary process.  Cardiomegaly. Electronically Signed   By: Lovey Newcomer M.D.   On: 06/08/2019 16:20   Dg Chest Port 1 View  Result Date: 06/07/2019 CLINICAL DATA:  Short of breath. EXAM: PORTABLE CHEST 1 VIEW COMPARISON:  06/02/2019 FINDINGS: Mild enlargement of the cardiopericardial silhouette is stable. No mediastinal or hilar masses. Prominent bronchovascular markings, stable. No evidence of pneumonia or pulmonary edema. No pleural effusion or pneumothorax. Skeletal structures are grossly intact. IMPRESSION: No acute cardiopulmonary disease.  Electronically Signed   By: Lajean Manes M.D.   On: 06/07/2019 17:34    Procedures Procedures (including critical care time)  Medications Ordered in ED Medications  oxyCODONE (Oxy IR/ROXICODONE) immediate release tablet 5 mg (5 mg Oral Given 06/08/19 1852)     Initial Impression / Assessment and Plan / ED Course  I have reviewed the triage vital signs and the nursing notes.  Pertinent labs & imaging results that were available during my care of the patient were reviewed by me and considered in my medical decision making (see chart for details).       On arrival, patient is afebrile, HDS, satting appropriately on room air no acute respiratory distress.  Abdomen is distended but soft and nonfocally tender. No focal tenderness palpation to suggest SBO, hepatitis, pyelonephritis; no fever, no leukocytosis, no pain to suggest  SBP. Per chart review, patient underwent therapeutic paracentesis yesterday in the ED during which time 2876mL were drained.   EKG: LBBB similar to prior, no hyperacute T waves noted CXR: no acute findings or evidence of fluid overload Screening labs: mild hyperkalemia of 5.3 in setting of missed dialysis  Unable to reach dialysis coordinator to help arrange patient for dialysis session tomorrow in setting of holiday week.  Tried reaching patient's dialysis center but they appear to be closed for the day.  Patient strongly encouraged to call for seen in the morning to try to have make-up dialysis tomorrow given his increasing potassium.  Patient versus understanding.  He otherwise has ambulated on room air with no acute respiratory distress and no other evidence for need for emergent dialysis or admission.  Abdomen remains distended but soft no focal tenderness on reassessment.  On bedside ultrasound, do not feel there is enough fluid collection for safe repeat paracentesis at this time as there are many loops of bowel near the abdominal wall and on the right lower quadrant patient's hepatomegaly/liver tip is noted.  Patient agreeable to have dialysis arranged tomorrow.  Per chart review, patient is scheduled on 12/1 to have repeat paracentesis with IR as an outpatient was encouraged to follow-up closely.  Stable for discharge at this time.  Strict return precautions given.    Final Clinical Impressions(s) / ED Diagnoses   Final diagnoses:  Abdominal distension    ED Discharge Orders    None       Burns Spain, MD 06/08/19 1910    Blanchie Dessert, MD 06/08/19 [phone]

## 2019-06-08 NOTE — Discharge Instructions (Addendum)
Please call your dialysis center first thing in the morning for make up Dialysis. Please follow up with IR as outpatient for scheduled paracentesis.

## 2019-06-09 ENCOUNTER — Encounter (HOSPITAL_COMMUNITY): Payer: Self-pay

## 2019-06-09 ENCOUNTER — Inpatient Hospital Stay (HOSPITAL_COMMUNITY)
Admission: EM | Admit: 2019-06-09 | Discharge: 2019-06-11 | DRG: 682 | Disposition: A | Discharge status: Home / Self Care | Payer: Medicare Other | Attending: Internal Medicine | Admitting: Internal Medicine

## 2019-06-09 ENCOUNTER — Other Ambulatory Visit: Payer: Self-pay

## 2019-06-09 ENCOUNTER — Emergency Department (HOSPITAL_COMMUNITY)
Admission: EM | Admit: 2019-06-09 | Discharge: 2019-06-09 | Disposition: A | Discharge status: Home / Self Care | Payer: Medicare Other | Source: Home / Self Care | Attending: Emergency Medicine | Admitting: Emergency Medicine

## 2019-06-09 DIAGNOSIS — F419 Anxiety disorder, unspecified: Secondary | ICD-10-CM | POA: Diagnosis present

## 2019-06-09 DIAGNOSIS — Z955 Presence of coronary angioplasty implant and graft: Secondary | ICD-10-CM

## 2019-06-09 DIAGNOSIS — I252 Old myocardial infarction: Secondary | ICD-10-CM

## 2019-06-09 DIAGNOSIS — J449 Chronic obstructive pulmonary disease, unspecified: Secondary | ICD-10-CM | POA: Diagnosis present

## 2019-06-09 DIAGNOSIS — K7290 Hepatic failure, unspecified without coma: Secondary | ICD-10-CM | POA: Diagnosis present

## 2019-06-09 DIAGNOSIS — E875 Hyperkalemia: Secondary | ICD-10-CM | POA: Insufficient documentation

## 2019-06-09 DIAGNOSIS — Z87442 Personal history of urinary calculi: Secondary | ICD-10-CM

## 2019-06-09 DIAGNOSIS — B181 Chronic viral hepatitis B without delta-agent: Secondary | ICD-10-CM | POA: Diagnosis present

## 2019-06-09 DIAGNOSIS — I2510 Atherosclerotic heart disease of native coronary artery without angina pectoris: Secondary | ICD-10-CM | POA: Diagnosis present

## 2019-06-09 DIAGNOSIS — Z800 Family history of malignant neoplasm of digestive organs: Secondary | ICD-10-CM

## 2019-06-09 DIAGNOSIS — N2581 Secondary hyperparathyroidism of renal origin: Secondary | ICD-10-CM | POA: Diagnosis present

## 2019-06-09 DIAGNOSIS — Z66 Do not resuscitate: Secondary | ICD-10-CM | POA: Diagnosis present

## 2019-06-09 DIAGNOSIS — Z03818 Encounter for observation for suspected exposure to other biological agents ruled out: Secondary | ICD-10-CM | POA: Diagnosis not present

## 2019-06-09 DIAGNOSIS — D631 Anemia in chronic kidney disease: Secondary | ICD-10-CM | POA: Diagnosis present

## 2019-06-09 DIAGNOSIS — B182 Chronic viral hepatitis C: Secondary | ICD-10-CM | POA: Diagnosis present

## 2019-06-09 DIAGNOSIS — Z79899 Other long term (current) drug therapy: Secondary | ICD-10-CM | POA: Insufficient documentation

## 2019-06-09 DIAGNOSIS — R188 Other ascites: Secondary | ICD-10-CM | POA: Diagnosis not present

## 2019-06-09 DIAGNOSIS — Z801 Family history of malignant neoplasm of trachea, bronchus and lung: Secondary | ICD-10-CM

## 2019-06-09 DIAGNOSIS — R140 Abdominal distension (gaseous): Secondary | ICD-10-CM | POA: Diagnosis present

## 2019-06-09 DIAGNOSIS — F1410 Cocaine abuse, uncomplicated: Secondary | ICD-10-CM | POA: Diagnosis present

## 2019-06-09 DIAGNOSIS — I120 Hypertensive chronic kidney disease with stage 5 chronic kidney disease or end stage renal disease: Principal | ICD-10-CM | POA: Diagnosis present

## 2019-06-09 DIAGNOSIS — Z9115 Patient's noncompliance with renal dialysis: Secondary | ICD-10-CM

## 2019-06-09 DIAGNOSIS — Z992 Dependence on renal dialysis: Secondary | ICD-10-CM

## 2019-06-09 DIAGNOSIS — I482 Chronic atrial fibrillation, unspecified: Secondary | ICD-10-CM | POA: Diagnosis present

## 2019-06-09 DIAGNOSIS — E8770 Fluid overload, unspecified: Secondary | ICD-10-CM | POA: Diagnosis not present

## 2019-06-09 DIAGNOSIS — Z885 Allergy status to narcotic agent status: Secondary | ICD-10-CM

## 2019-06-09 DIAGNOSIS — Z8249 Family history of ischemic heart disease and other diseases of the circulatory system: Secondary | ICD-10-CM

## 2019-06-09 DIAGNOSIS — N186 End stage renal disease: Secondary | ICD-10-CM

## 2019-06-09 DIAGNOSIS — R0600 Dyspnea, unspecified: Secondary | ICD-10-CM | POA: Diagnosis present

## 2019-06-09 DIAGNOSIS — K7460 Unspecified cirrhosis of liver: Secondary | ICD-10-CM | POA: Diagnosis present

## 2019-06-09 DIAGNOSIS — F17210 Nicotine dependence, cigarettes, uncomplicated: Secondary | ICD-10-CM | POA: Insufficient documentation

## 2019-06-09 DIAGNOSIS — I4891 Unspecified atrial fibrillation: Secondary | ICD-10-CM | POA: Diagnosis present

## 2019-06-09 DIAGNOSIS — Z888 Allergy status to other drugs, medicaments and biological substances status: Secondary | ICD-10-CM

## 2019-06-09 DIAGNOSIS — I480 Paroxysmal atrial fibrillation: Secondary | ICD-10-CM | POA: Diagnosis present

## 2019-06-09 DIAGNOSIS — Z808 Family history of malignant neoplasm of other organs or systems: Secondary | ICD-10-CM

## 2019-06-09 DIAGNOSIS — Z20828 Contact with and (suspected) exposure to other viral communicable diseases: Secondary | ICD-10-CM | POA: Diagnosis present

## 2019-06-09 DIAGNOSIS — K219 Gastro-esophageal reflux disease without esophagitis: Secondary | ICD-10-CM | POA: Diagnosis present

## 2019-06-09 DIAGNOSIS — Z825 Family history of asthma and other chronic lower respiratory diseases: Secondary | ICD-10-CM

## 2019-06-09 LAB — CBC WITH DIFFERENTIAL/PLATELET
Abs Immature Granulocytes: 0.04 10*3/uL (ref 0.00–0.07)
Basophils Absolute: 0.1 10*3/uL (ref 0.0–0.1)
Basophils Relative: 1 %
Eosinophils Absolute: 0.1 10*3/uL (ref 0.0–0.5)
Eosinophils Relative: 2 %
HCT: 26.1 % — ABNORMAL LOW (ref 39.0–52.0)
Hemoglobin: 9.1 g/dL — ABNORMAL LOW (ref 13.0–17.0)
Immature Granulocytes: 0 %
Lymphocytes Relative: 14 %
Lymphs Abs: 1.3 10*3/uL (ref 0.7–4.0)
MCH: 27.4 pg (ref 26.0–34.0)
MCHC: 34.9 g/dL (ref 30.0–36.0)
MCV: 78.6 fL — ABNORMAL LOW (ref 80.0–100.0)
Monocytes Absolute: 0.8 10*3/uL (ref 0.1–1.0)
Monocytes Relative: 9 %
Neutro Abs: 6.6 10*3/uL (ref 1.7–7.7)
Neutrophils Relative %: 74 %
Platelets: 171 10*3/uL (ref 150–400)
RBC: 3.32 MIL/uL — ABNORMAL LOW (ref 4.22–5.81)
RDW: 16.2 % — ABNORMAL HIGH (ref 11.5–15.5)
WBC: 8.9 10*3/uL (ref 4.0–10.5)
nRBC: 0 % (ref 0.0–0.2)

## 2019-06-09 LAB — BASIC METABOLIC PANEL
Anion gap: 15 (ref 5–15)
Anion gap: 16 — ABNORMAL HIGH (ref 5–15)
BUN: 52 mg/dL — ABNORMAL HIGH (ref 6–20)
BUN: 57 mg/dL — ABNORMAL HIGH (ref 6–20)
CO2: 25 mmol/L (ref 22–32)
CO2: 24 mmol/L (ref 22–32)
Calcium: 10 mg/dL (ref 8.9–10.3)
Calcium: 10.1 mg/dL (ref 8.9–10.3)
Chloride: 95 mmol/L — ABNORMAL LOW (ref 98–111)
Chloride: 96 mmol/L — ABNORMAL LOW (ref 98–111)
Creatinine, Ser: 10.55 mg/dL — ABNORMAL HIGH (ref 0.61–1.24)
Creatinine, Ser: 10.57 mg/dL — ABNORMAL HIGH (ref 0.61–1.24)
GFR calc Af Amer: 6 mL/min — ABNORMAL LOW (ref >60)
GFR calc Af Amer: 6 mL/min — ABNORMAL LOW (ref >60)
GFR calc non Af Amer: 5 mL/min — ABNORMAL LOW (ref >60)
GFR calc non Af Amer: 5 mL/min — ABNORMAL LOW (ref >60)
Glucose, Bld: 102 mg/dL — ABNORMAL HIGH (ref 70–99)
Glucose, Bld: 116 mg/dL — ABNORMAL HIGH (ref 70–99)
Potassium: 5.8 mmol/L — ABNORMAL HIGH (ref 3.5–5.1)
Potassium: 5.8 mmol/L — ABNORMAL HIGH (ref 3.5–5.1)
Sodium: 135 mmol/L (ref 135–145)
Sodium: 136 mmol/L (ref 135–145)

## 2019-06-09 LAB — CBC
HCT: 24.4 % — ABNORMAL LOW (ref 39.0–52.0)
Hemoglobin: 8.7 g/dL — ABNORMAL LOW (ref 13.0–17.0)
MCH: 27.7 pg (ref 26.0–34.0)
MCHC: 35.7 g/dL (ref 30.0–36.0)
MCV: 77.7 fL — ABNORMAL LOW (ref 80.0–100.0)
Platelets: 167 10*3/uL (ref 150–400)
RBC: 3.14 MIL/uL — ABNORMAL LOW (ref 4.22–5.81)
RDW: 16.1 % — ABNORMAL HIGH (ref 11.5–15.5)
WBC: 9 10*3/uL (ref 4.0–10.5)
nRBC: 0 % (ref 0.0–0.2)

## 2019-06-09 LAB — MRSA PCR SCREENING: MRSA by PCR: NEGATIVE

## 2019-06-09 MED ORDER — SODIUM ZIRCONIUM CYCLOSILICATE 5 G PO PACK
5.0000 g | PACK | Freq: Once | ORAL | Status: DC
  Filled 2019-06-09: qty 1

## 2019-06-09 MED ORDER — HEPARIN SODIUM (PORCINE) 5000 UNIT/ML IJ SOLN
5000.0000 [IU] | Freq: Three times a day (TID) | INTRAMUSCULAR | Status: DC
  Filled 2019-06-09 (×2): qty 1

## 2019-06-09 MED ORDER — METOPROLOL TARTRATE 100 MG PO TABS
100.0000 mg | ORAL_TABLET | Freq: Two times a day (BID) | ORAL | Status: DC
  Administered 2019-06-10: 100 mg via ORAL
  Filled 2019-06-09: qty 1
  Filled 2019-06-09: qty 4

## 2019-06-09 MED ORDER — HYDRALAZINE HCL 100 MG PO TABS: 50.0000 mg | ORAL_TABLET | Freq: Three times a day (TID) | ORAL | Status: DC

## 2019-06-09 MED ORDER — OXYCODONE HCL 5 MG PO TABS: 5.0000 mg | ORAL_TABLET | Freq: Two times a day (BID) | ORAL | Status: DC | PRN

## 2019-06-09 MED ORDER — ZOLPIDEM TARTRATE 5 MG PO TABS: 10.0000 mg | ORAL_TABLET | Freq: Every evening | ORAL | Status: DC | PRN

## 2019-06-09 MED ORDER — SEVELAMER CARBONATE 800 MG PO TABS
1600.0000 mg | ORAL_TABLET | ORAL | Status: DC
  Administered 2019-06-10 (×2): 1600 mg via ORAL
  Filled 2019-06-09 (×2): qty 2

## 2019-06-09 MED ORDER — MONTELUKAST SODIUM 10 MG PO TABS
10.0000 mg | ORAL_TABLET | Freq: Every evening | ORAL | Status: DC
  Administered 2019-06-10: 10 mg via ORAL
  Filled 2019-06-09: qty 1

## 2019-06-09 MED ORDER — PANTOPRAZOLE SODIUM 40 MG PO TBEC
40.0000 mg | DELAYED_RELEASE_TABLET | Freq: Every day | ORAL | Status: DC
  Administered 2019-06-10 – 2019-06-11 (×2): 40 mg via ORAL
  Filled 2019-06-09 (×2): qty 1

## 2019-06-09 MED ORDER — CHLORHEXIDINE GLUCONATE CLOTH 2 % EX PADS: 6.0000 | MEDICATED_PAD | Freq: Every day | CUTANEOUS | Status: DC

## 2019-06-09 MED ORDER — SULFAMETHOXAZOLE-TRIMETHOPRIM 800-160 MG PO TABS
1.0000 | ORAL_TABLET | Freq: Every day | ORAL | Status: DC
  Administered 2019-06-10 – 2019-06-11 (×2): 1 via ORAL
  Filled 2019-06-09 (×2): qty 1

## 2019-06-09 MED ORDER — HYDROMORPHONE HCL 2 MG PO TABS
1.0000 mg | ORAL_TABLET | Freq: Four times a day (QID) | ORAL | Status: DC | PRN
  Administered 2019-06-09: 1 mg via ORAL
  Filled 2019-06-09: qty 1

## 2019-06-09 MED ORDER — SEVELAMER CARBONATE 800 MG PO TABS: 1600.0000 mg | ORAL_TABLET | ORAL | Status: DC

## 2019-06-09 MED ORDER — MOMETASONE FURO-FORMOTEROL FUM 200-5 MCG/ACT IN AERO
2.0000 | INHALATION_SPRAY | Freq: Two times a day (BID) | RESPIRATORY_TRACT | Status: DC
  Administered 2019-06-10 – 2019-06-11 (×2): 2 via RESPIRATORY_TRACT
  Filled 2019-06-09: qty 8.8

## 2019-06-09 MED ORDER — LACTULOSE 10 GM/15ML PO SOLN
30.0000 g | Freq: Two times a day (BID) | ORAL | Status: DC
  Filled 2019-06-09 (×4): qty 45

## 2019-06-09 MED ORDER — SODIUM ZIRCONIUM CYCLOSILICATE 10 G PO PACK
10.0000 g | PACK | Freq: Once | ORAL | Status: AC
  Administered 2019-06-09: 10 g via ORAL
  Filled 2019-06-09: qty 1

## 2019-06-09 MED ORDER — DARBEPOETIN ALFA 40 MCG/0.4ML IJ SOSY: 40.0000 ug | PREFILLED_SYRINGE | INTRAMUSCULAR | Status: DC

## 2019-06-09 MED ORDER — ZOLPIDEM TARTRATE 5 MG PO TABS
10.0000 mg | ORAL_TABLET | Freq: Every evening | ORAL | Status: DC | PRN
  Administered 2019-06-10: 10 mg via ORAL
  Filled 2019-06-09: qty 2

## 2019-06-09 MED ORDER — HYDRALAZINE HCL 50 MG PO TABS
50.0000 mg | ORAL_TABLET | Freq: Three times a day (TID) | ORAL | Status: DC
  Administered 2019-06-09 – 2019-06-10 (×2): 50 mg via ORAL
  Filled 2019-06-09 (×3): qty 1

## 2019-06-09 MED ORDER — SODIUM ZIRCONIUM CYCLOSILICATE 10 G PO PACK
10.0000 g | PACK | Freq: Three times a day (TID) | ORAL | Status: AC
  Administered 2019-06-09 – 2019-06-10 (×2): 10 g via ORAL
  Filled 2019-06-09 (×2): qty 1

## 2019-06-09 MED ORDER — SODIUM ZIRCONIUM CYCLOSILICATE 10 G PO PACK: 10.0000 g | PACK | Freq: Once | ORAL | Status: DC

## 2019-06-09 MED ORDER — SEVELAMER CARBONATE 800 MG PO TABS
2400.0000 mg | ORAL_TABLET | Freq: Three times a day (TID) | ORAL | Status: DC
  Administered 2019-06-10 – 2019-06-11 (×3): 2400 mg via ORAL
  Filled 2019-06-09 (×4): qty 3

## 2019-06-09 MED ORDER — ALBUTEROL SULFATE (2.5 MG/3ML) 0.083% IN NEBU: 2.5000 mg | INHALATION_SOLUTION | Freq: Four times a day (QID) | RESPIRATORY_TRACT | Status: DC | PRN

## 2019-06-09 NOTE — ED Triage Notes (Signed)
Pt arrives ambulatory POV stating he needs dialysis. Pt was seen earlier today, had bloodwork drawn and was given Lokelma for K of 5.3, MD advised pt to have dialysis tmrw, offered admission for dialysis tmrw and declined. Pt ambulated into lobby and sat himself in wheelchair and scooted up to desk stating "dialyze me now".

## 2019-06-09 NOTE — ED Notes (Signed)
Patient refused COVID swab

## 2019-06-09 NOTE — ED Provider Notes (Signed)
[facility] EMERGENCY DEPARTMENT Provider Note   CSN: [id number] Arrival date & time: 06/09/19  1447     History   Chief Complaint Chief Complaint  Patient presents with  . Wants Dialysis    HPI [name] is a 58 y.o. male.     Pt just left here this afternoon.  Pt states he went home to lock his house. Pt reports he was advised to be admitted to have dialysis in the am.  Pt reports he can not get dialysis at the center. Pt denies any new concerns.  I reviewed pt's visit notes. He has not had dialysis in 5 days.      Past Medical History:  Diagnosis Date  . Anemia   . Anxiety   . Arthritis    left shoulder  . Atherosclerosis of aorta ([place])   . Cardiomegaly   . Chest pain    DATE UNKNOWN, C/O PERIODICALLY  . Cocaine abuse ([place])   . COPD exacerbation ([place]) 08/17/2016  . Coronary artery disease    stent 02/22/17  . ESRD (end stage renal disease) on dialysis ([place])    "[name]; MWF" (07/04/2017)  . GERD (gastroesophageal reflux disease)    DATE UNKNOWN  . Hemorrhoids   . Hepatitis B, chronic ([place])   . Hepatitis C   . History of kidney stones   . Hyperkalemia   . Hypertension   . Kidney failure   . Metabolic bone disease    Patient denies  . Mitral stenosis   . Myocardial infarction ([place])   . Pneumonia   . Pulmonary edema   . Solitary rectal ulcer syndrome 07/2017   at [facility] for rectal bleeding  . Tubular adenoma of colon     Patient Active Problem List   Diagnosis Date Noted  . Acquired thrombophilia ([place]) 06/05/2019  . A-fib ([place]) 05/30/2019  . Atrial fibrillation with RVR ([place]) 05/29/2019  . Melena   . Pressure injury of skin 03/09/2019  . Abdominal distention   . Volume overload 12/28/2018  . Sepsis ([place]) 09/12/2018  . Atherosclerosis of native coronary artery of native heart without angina pectoris 03/11/2018  . Benign neoplasm of cecum   . Benign neoplasm of ascending colon   . Benign neoplasm of descending  colon   . Benign neoplasm of rectum   . Paroxysmal atrial fibrillation ([place]) 01/23/2018  . Hx of colonic polyps 01/20/2018  . ESRD (end stage renal disease) ([place]) 11/21/2017  . GERD (gastroesophageal reflux disease) 11/16/2017  . Liver cirrhosis ([place]) 11/15/2017  . DNR (do not resuscitate)   . Palliative care by specialist   . Hyponatremia 11/04/2017  . SBP (spontaneous bacterial peritonitis) ([place]) 10/30/2017  . Liver disease, chronic 10/30/2017  . SOB (shortness of breath)   . Abdominal pain 10/28/2017  . Upper airway cough syndrome with flattening on f/v loop 10/13/17 c/w vcd 10/17/2017  . Elevated diaphragm 10/13/2017  . Ileus ([place]) 09/29/2017  . Malnutrition of moderate degree 09/29/2017  . Sinus congestion 09/03/2017  . Symptomatic anemia 09/02/2017  . Other cirrhosis of liver ([place]) 09/02/2017  . Left bundle branch block 09/02/2017  . Mitral stenosis 09/02/2017  . Hematochezia 07/15/2017  . Wide-complex tachycardia ([place])   . Endotracheally intubated   . ESRD on dialysis ([place]) 07/04/2017  . Acute respiratory failure with hypoxia ([place]) 06/18/2017  . CKD (chronic kidney disease) stage V requiring chronic dialysis ([place]) 06/18/2017  . History of Cocaine abuse ([place]) 06/18/2017  . Hypertension  06/18/2017  . Infection of AV graft for dialysis ([place]) 06/18/2017  . Anxiety 06/18/2017  . Anemia due to chronic kidney disease 06/18/2017  . Atypical atrial flutter ([place]) 06/18/2017  . Personality disorder ([place]) 06/13/2017  . Cellulitis 06/12/2017  . Adjustment disorder with mixed anxiety and depressed mood 06/10/2017  . Suicidal ideation 06/10/2017  . Arm wound, left, sequela 06/10/2017  . Dyspnea on exertion 05/29/2017  . Tachycardia 05/29/2017  . Hyperkalemia 05/22/2017  . Acute metabolic encephalopathy   . Anemia 04/23/2017  . Ascites 04/23/2017  . COPD (chronic obstructive pulmonary disease) ([place]) 04/23/2017  . Acute on chronic respiratory failure with hypoxia ([place]) 03/25/2017   . Arrhythmia 03/25/2017  . COPD GOLD 0 with flattening on inps f/v  09/27/2016  . Essential hypertension 09/27/2016  . Fluid overload 08/30/2016  . COPD exacerbation ([place]) 08/17/2016  . Hypertensive urgency 08/17/2016  . Respiratory failure ([place]) 08/17/2016  . Problem with dialysis access ([place]) 07/23/2016  . Chronic hepatitis B ([place]) 03/05/2014  . Chronic hepatitis C without hepatic coma ([place]) 03/05/2014  . Internal hemorrhoids with bleeding, swelling and itching 03/05/2014  . Thrombocytopenia ([place]) 03/05/2014  . Chest pain 02/27/2014  . Alcohol abuse 04/14/2009  . Cigarette smoker 04/14/2009  . GANGLION CYST 04/14/2009    Past Surgical History:  Procedure Laterality Date  . A/V FISTULAGRAM Left 05/26/2017   Procedure: A/V FISTULAGRAM;  Surgeon: Conrad Mount Sterling, MD;  Location: [facility];  Service: Cardiovascular;  Laterality: Left;  . A/V FISTULAGRAM Right 11/18/2017   Procedure: A/V FISTULAGRAM - Right Arm;  Surgeon: Elam Dutch, MD;  Location: [facility];  Service: Cardiovascular;  Laterality: Right;  . APPLICATION OF WOUND VAC Left 06/14/2017   Procedure: APPLICATION OF WOUND VAC;  Surgeon: Katha Cabal, MD;  Location: [facility];  Service: Vascular;  Laterality: Left;  . AV FISTULA PLACEMENT  2012   BELIEVED WAS PLACED IN JUNE  . AV FISTULA PLACEMENT Right 08/09/2017   Procedure: Creation Right arm ARTERIOVENOUS BRACHIOCEPOHALIC FISTULA;  Surgeon: Elam Dutch, MD;  Location: [facility] OR;  Service: Vascular;  Laterality: Right;  . AV FISTULA PLACEMENT Right 11/22/2017   Procedure: INSERTION OF ARTERIOVENOUS (AV) GORE-TEX GRAFT RIGHT UPPER ARM;  Surgeon: Elam Dutch, MD;  Location: [facility];  Service: Vascular;  Laterality: Right;  . BIOPSY  01/25/2018   Procedure: BIOPSY;  Surgeon: Jerene Bears, MD;  Location: [facility];  Service: Gastroenterology;;  . BIOPSY  04/10/2019   Procedure: BIOPSY;  Surgeon: Jerene Bears, MD;  Location: [facility];   Service: Gastroenterology;;  . COLONOSCOPY    . COLONOSCOPY WITH PROPOFOL N/A 01/25/2018   Procedure: COLONOSCOPY WITH PROPOFOL;  Surgeon: Jerene Bears, MD;  Location: [facility];  Service: Gastroenterology;  Laterality: N/A;  . CORONARY STENT INTERVENTION N/A 02/22/2017   Procedure: CORONARY STENT INTERVENTION;  Surgeon: Nigel Mormon, MD;  Location: [facility];  Service: Cardiovascular;  Laterality: N/A;  . ESOPHAGOGASTRODUODENOSCOPY (EGD) WITH PROPOFOL N/A 01/25/2018   Procedure: ESOPHAGOGASTRODUODENOSCOPY (EGD) WITH PROPOFOL;  Surgeon: Jerene Bears, MD;  Location: [facility];  Service: Gastroenterology;  Laterality: N/A;  . ESOPHAGOGASTRODUODENOSCOPY (EGD) WITH PROPOFOL N/A 04/10/2019   Procedure: ESOPHAGOGASTRODUODENOSCOPY (EGD) WITH PROPOFOL;  Surgeon: Jerene Bears, MD;  Location: [facility];  Service: Gastroenterology;  Laterality: N/A;  . FLEXIBLE SIGMOIDOSCOPY N/A 07/15/2017   Procedure: FLEXIBLE SIGMOIDOSCOPY;  Surgeon: Carol Ada, MD;  Location: [facility];  Service: Endoscopy;  Laterality: N/A;  . HEMORRHOID  BANDING    . I&D EXTREMITY Left 06/01/2017   Procedure: IRRIGATION AND DEBRIDEMENT LEFT ARM HEMATOMA WITH LIGATION OF LEFT ARM AV FISTULA;  Surgeon: Elam Dutch, MD;  Location: [facility];  Service: Vascular;  Laterality: Left;  . I&D EXTREMITY Left 06/14/2017   Procedure: IRRIGATION AND DEBRIDEMENT EXTREMITY;  Surgeon: Katha Cabal, MD;  Location: [facility];  Service: Vascular;  Laterality: Left;  . INSERTION OF DIALYSIS CATHETER  05/30/2017  . INSERTION OF DIALYSIS CATHETER N/A 05/30/2017   Procedure: INSERTION OF DIALYSIS CATHETER;  Surgeon: Elam Dutch, MD;  Location: [facility];  Service: Vascular;  Laterality: N/A;  . IR PARACENTESIS  08/30/2017  . IR PARACENTESIS  09/29/2017  . IR PARACENTESIS  10/28/2017  . IR PARACENTESIS  11/09/2017  . IR PARACENTESIS  11/16/2017  . IR PARACENTESIS  11/28/2017  . IR PARACENTESIS  12/01/2017  . IR PARACENTESIS   12/06/2017  . IR PARACENTESIS  01/03/2018  . IR PARACENTESIS  01/23/2018  . IR PARACENTESIS  02/07/2018  . IR PARACENTESIS  02/21/2018  . IR PARACENTESIS  03/06/2018  . IR PARACENTESIS  03/17/2018  . IR PARACENTESIS  04/04/2018  . IR PARACENTESIS  12/28/2018  . IR PARACENTESIS  01/08/2019  . IR PARACENTESIS  01/23/2019  . IR PARACENTESIS  02/01/2019  . IR PARACENTESIS  02/19/2019  . IR PARACENTESIS  03/01/2019  . IR PARACENTESIS  03/15/2019  . IR PARACENTESIS  04/03/2019  . IR PARACENTESIS  04/12/2019  . IR PARACENTESIS  05/01/2019  . IR PARACENTESIS  05/08/2019  . IR PARACENTESIS  05/24/2019  . IR RADIOLOGIST EVAL & MGMT  02/14/2018  . IR RADIOLOGIST EVAL & MGMT  02/22/2019  . LEFT HEART CATH AND CORONARY ANGIOGRAPHY N/A 02/22/2017   Procedure: LEFT HEART CATH AND CORONARY ANGIOGRAPHY;  Surgeon: Nigel Mormon, MD;  Location: [facility];  Service: Cardiovascular;  Laterality: N/A;  . LIGATION OF ARTERIOVENOUS  FISTULA Left 02/16/8675   Procedure: Plication of Left Arm Arteriovenous Fistula;  Surgeon: Elam Dutch, MD;  Location: [facility];  Service: Vascular;  Laterality: Left;  . POLYPECTOMY    . POLYPECTOMY  01/25/2018   Procedure: POLYPECTOMY;  Surgeon: Jerene Bears, MD;  Location: [facility];  Service: Gastroenterology;;  . REVISON OF ARTERIOVENOUS FISTULA Left 01/28/9469   Procedure: PLICATION OF DISTAL ANEURYSMAL SEGEMENT OF LEFT UPPER ARM ARTERIOVENOUS FISTULA;  Surgeon: Elam Dutch, MD;  Location: [facility];  Service: Vascular;  Laterality: Left;  . REVISON OF ARTERIOVENOUS FISTULA Left 9/62/8366   Procedure: Plication of Left Upper Arm Fistula ;  Surgeon: Waynetta Sandy, MD;  Location: [facility];  Service: Vascular;  Laterality: Left;  . SKIN GRAFT SPLIT THICKNESS LEG / FOOT Left    SKIN GRAFT SPLIT THICKNESS LEFT ARM DONOR SITE: LEFT ANTERIOR THIGH  . SKIN SPLIT GRAFT Left 07/04/2017   Procedure: SKIN GRAFT SPLIT THICKNESS LEFT ARM DONOR SITE: LEFT ANTERIOR THIGH;   Surgeon: Elam Dutch, MD;  Location: [facility];  Service: Vascular;  Laterality: Left;  . THROMBECTOMY W/ EMBOLECTOMY Left 06/05/2017   Procedure: EXPLORATION OF LEFT ARM FOR BLEEDING; OVERSEWED PROXIMAL FISTULA;  Surgeon: Angelia Mould, MD;  Location: [facility];  Service: Vascular;  Laterality: Left;  . WOUND EXPLORATION Left 06/03/2017   Procedure: WOUND EXPLORATION WITH WOUND VAC APPLICATION TO LEFT ARM;  Surgeon: Angelia Mould, MD;  Location: [facility];  Service: Vascular;  Laterality: Left;        Home Medications  Prior to Admission medications   Medication Sig Start Date End Date Taking? Authorizing Provider  albuterol (VENTOLIN HFA) 108 (90 Base) MCG/ACT inhaler Inhale 2 puffs into the lungs every 6 (six) hours as needed for wheezing or shortness of breath.    [provider]  hydrALAZINE (APRESOLINE) 100 MG tablet Take 0.5 tablets (50 mg total) by mouth 3 (three) times daily. 05/31/19   [place]field, Carley D, DO  lactulose (CHRONULAC) 10 GM/15ML solution TAKE 45 MLS BY MOUTH 2 TIMES DAILY. 06/04/19   Al Decant, MD  metoprolol tartrate (LOPRESSOR) 100 MG tablet Take 1 tablet (100 mg total) by mouth 2 (two) times daily. 06/04/19   Al Decant, MD  montelukast (SINGULAIR) 10 MG tablet Take 10 mg by mouth every evening.     [provider]  ondansetron (ZOFRAN) 4 MG tablet Take 1 tablet (4 mg total) by mouth daily as needed for up to 10 days for nausea or vomiting. 06/05/19 06/15/19  Maudie Mercury, MD  pantoprazole (PROTONIX) 40 MG tablet Take 1 tablet (40 mg total) by mouth daily. 06/05/19   Maudie Mercury, MD  sevelamer carbonate (RENVELA) 800 MG tablet Take 4 tablets with meals  And 2 tablets twice daily with snacks Patient taking differently: Take 1,600 mg by mouth See admin instructions. Take 2 tablets (1600mg ) with meals & with snacks 01/22/18   Patrecia Pour, MD  sulfamethoxazole-trimethoprim (BACTRIM DS) 800-160 MG tablet Take 1 tablet by  mouth daily. Patient taking differently: Take 1 tablet by mouth 2 (two) times daily.  12/30/18   Geradine Girt, DO  SYMBICORT 160-4.5 MCG/ACT inhaler Inhale 2 puffs into the lungs 2 (two) times daily.  11/30/18   [provider]  zolpidem (AMBIEN) 10 MG tablet Take 10 mg by mouth at bedtime as needed for sleep.    [provider]    Family History Family History  Problem Relation Age of Onset  . Heart disease Mother   . Lung cancer Mother   . Heart disease Father   . Malignant hyperthermia Father   . COPD Father   . Throat cancer Sister   . Esophageal cancer Sister   . Hypertension Other   . COPD Other   . Colon cancer Neg Hx   . Colon polyps Neg Hx   . Rectal cancer Neg Hx   . Stomach cancer Neg Hx     Social History Social History   Tobacco Use  . Smoking status: Current Every Day Smoker    Packs/day: 0.50    Years: 43.00    Pack years: 21.50    Types: Cigarettes    Start date: 08/13/1973  . Smokeless tobacco: Never Used  . Tobacco comment: i dont know i just make   Substance Use Topics  . Alcohol use: Not Currently    Frequency: Never    Comment: quit drinking in 2017  . Drug use: Not Currently    Types: Marijuana, Cocaine    Comment: reports using once every 3 months, 04-06-2019 was this      Allergies   Morphine and related, Aspirin, Clonidine derivatives, Tramadol, and Tylenol [acetaminophen]   Review of Systems Review of Systems  Gastrointestinal: Positive for abdominal distention and abdominal pain.  All other systems reviewed and are negative.    Physical Exam Updated Vital Signs BP (!) 143/68 (BP Location: Left Arm)   Pulse 72   Temp 98.1 F (36.7 C) (Oral)   Resp 17   Ht 5\' 10"  (1.778  m)   Wt 67 kg   SpO2 100%   BMI 21.19 kg/m   Physical Exam Vitals signs and nursing note reviewed.  Constitutional:      Appearance: He is well-developed.  HENT:     Head: Normocephalic.  Neck:     Musculoskeletal: Normal range of  motion.  Cardiovascular:     Rate and Rhythm: Normal rate.  Pulmonary:     Effort: Pulmonary effort is normal.  Abdominal:     General: There is no distension.  Musculoskeletal: Normal range of motion.  Neurological:     Mental Status: He is alert and oriented to person, place, and time.  Psychiatric:        Mood and Affect: Mood normal.      ED Treatments / Results  Labs (all labs ordered are listed, but only abnormal results are displayed) Labs Reviewed - No data to display  EKG None  Radiology Dg Chest Portable 1 View  Result Date: 06/08/2019 CLINICAL DATA:  Patient with abdominal distension. EXAM: PORTABLE CHEST 1 VIEW COMPARISON:  Chest radiograph 06/07/2019 FINDINGS: Stable cardiomegaly. No large area of pulmonary consolidation. No pleural effusion or pneumothorax. Regional skeleton is unremarkable. IMPRESSION: No acute cardiopulmonary process.  Cardiomegaly. Electronically Signed   By: Lovey Newcomer M.D.   On: 06/08/2019 16:20   Dg Chest Port 1 View  Result Date: 06/07/2019 CLINICAL DATA:  Short of breath. EXAM: PORTABLE CHEST 1 VIEW COMPARISON:  06/02/2019 FINDINGS: Mild enlargement of the cardiopericardial silhouette is stable. No mediastinal or hilar masses. Prominent bronchovascular markings, stable. No evidence of pneumonia or pulmonary edema. No pleural effusion or pneumothorax. Skeletal structures are grossly intact. IMPRESSION: No acute cardiopulmonary disease. Electronically Signed   By: Lajean Manes M.D.   On: 06/07/2019 17:34    Procedures Procedures (including critical care time)  Medications Ordered in ED Medications - No data to display   Initial Impression / Assessment and Plan / ED Course  I have reviewed the triage vital signs and the nursing notes.  Pertinent labs & imaging results that were available during my care of the patient were reviewed by me and considered in my medical decision making (see chart for details).       I spoke to Dr.  Jonnie Finner who advised pt can be dialyzed in am.  Internal Medicine resident consulted to admit    Final Clinical Impressions(s) / ED Diagnoses   Final diagnoses:  ESRF (end stage renal failure) [facility])    ED Discharge Orders    None       [name] 06/09/19 1619    Quintella Reichert, MD 06/10/19 0126

## 2019-06-09 NOTE — Discharge Instructions (Signed)
Please call your dialysis center for dialysis tomorrow.  Your potassium is 5.8 today

## 2019-06-09 NOTE — H&P (Signed)
Date: 06/09/2019               Patient Name:  [name] MRN: [id number]  DOB: 17-Feb-1961 Age / Sex: 58 y.o., male   PCP: [name], FNP         Medical Service: Internal Medicine Teaching Service         Attending Physician: Dr. Evette Doffing    First Contact: Dr. Koleen Distance Pager: [phone]  Second Contact: Dr. Court Joy Pager: [phone]       After Hours (After 5p/  First Contact Pager: [phone]  weekends / holidays): Second Contact Pager: [phone]   Chief Complaint: Missed dialysis, shortness of breath  History of Present Illness: [name] is a  58 y.o.  male w/ PMH significant for ESRD on HD MWF, recurrent ascites due to decompensated cirrhosis and history of SBP, paroxysmal atrial fibrillation, COPD, chronic normocytic anemia due to renal disease, and CAD s/p stent placement in 2018, with multiple recent hospital and ED admissions. He was recently admitted for A.fib with RVR requiring diltiazem drip and subsequently discharged with metoprolol 100mg  bid. Since discharge, patient has had multiple ED visits for abdominal distension and had a paracentesis on 11/26 with 2.8L of ascitic fluid removed. He has subsequently not had dialysis over the past 5 days due to his abdominal distension and pain. His last session was on Tuesday. He presented today for dialysis. He denies any fevers/chills, headaches, dizziness/lightheadedness, chest pain, palpitations, generalized weakness, nausea or vomiting. He does note ongoing abdominal pain and distension causing shortness of breath that is improved with lying on his side.   ED course:  Afebrile, pulse 72, RR 17, BP 143/68.  CHR from yesterday showed no acute cardiopulmonary process, cardiomegaly.  BMP from earlier today reviewed K+ 5.8  Meds:  Current Facility-Administered Medications for the 06/09/19 encounter [facility])  Medication  . albumin human 25 % solution 50 g   Current Meds  Medication Sig  .  albuterol (VENTOLIN HFA) 108 (90 Base) MCG/ACT inhaler Inhale 2 puffs into the lungs every 6 (six) hours as needed for wheezing or shortness of breath.  . hydrALAZINE (APRESOLINE) 100 MG tablet Take 0.5 tablets (50 mg total) by mouth 3 (three) times daily. (Patient taking differently: Take 100 mg by mouth 3 (three) times daily. )  . lactulose (CHRONULAC) 10 GM/15ML solution TAKE 45 MLS BY MOUTH 2 TIMES DAILY. (Patient taking differently: Take 30 g by mouth 2 (two) times daily. )  . loperamide (IMODIUM A-D) 2 MG tablet Take 2 mg by mouth 4 (four) times daily as needed for diarrhea or loose stools.  . metoprolol tartrate (LOPRESSOR) 100 MG tablet Take 1 tablet (100 mg total) by mouth 2 (two) times daily.  . montelukast (SINGULAIR) 10 MG tablet Take 10 mg by mouth every evening.   . ondansetron (ZOFRAN) 4 MG tablet Take 1 tablet (4 mg total) by mouth daily as needed for up to 10 days for nausea or vomiting. (Patient taking differently: Take 4 mg by mouth 3 (three) times daily as needed for nausea or vomiting. )  . oxyCODONE (OXY IR/ROXICODONE) 5 MG immediate release tablet Take 5 mg by mouth 2 (two) times daily as needed for moderate pain or severe pain.  . pantoprazole (PROTONIX) 40 MG tablet Take 1 tablet (40 mg total) by mouth daily. (Patient taking differently: Take 40 mg by mouth See admin instructions. Take 40 mg by mouth in the morning before breakfast and  an additional 40 mg once a day, if symptoms persist)  . sevelamer carbonate (RENVELA) 800 MG tablet Take 4 tablets with meals  And 2 tablets twice daily with snacks (Patient taking differently: Take 1,600-2,400 mg by mouth See admin instructions. Take 2,400 mg by mouth three times a day with meals and 1,600 mg with each snack)  . sulfamethoxazole-trimethoprim (BACTRIM DS) 800-160 MG tablet Take 1 tablet by mouth daily. (Patient taking differently: Take 1 tablet by mouth 2 (two) times daily. )  . SYMBICORT 160-4.5 MCG/ACT inhaler Inhale 2 puffs into  the lungs 2 (two) times daily.   Marland Kitchen zolpidem (AMBIEN) 10 MG tablet Take 10 mg by mouth at bedtime as needed for sleep.     Allergies: Allergies as of 06/09/2019 - Review Complete 06/09/2019  Allergen Reaction Noted  . Morphine and related Other (See Comments) 12/23/2017  . Aspirin Other (See Comments) 02/27/2014  . Clonidine derivatives Itching 08/05/2013  . Tramadol Itching 02/25/2011  . Tylenol [acetaminophen] Nausea Only 10/27/2017   Past Medical History:  Diagnosis Date  . Anemia   . Anxiety   . Arthritis    left shoulder  . Atherosclerosis of aorta ([place])   . Cardiomegaly   . Chest pain    DATE UNKNOWN, C/O PERIODICALLY  . Cocaine abuse ([place])   . COPD exacerbation ([place]) 08/17/2016  . Coronary artery disease    stent 02/22/17  . ESRD (end stage renal disease) on dialysis ([place])    "[name]; MWF" (07/04/2017)  . GERD (gastroesophageal reflux disease)    DATE UNKNOWN  . Hemorrhoids   . Hepatitis B, chronic ([place])   . Hepatitis C   . History of kidney stones   . Hyperkalemia   . Hypertension   . Kidney failure   . Metabolic bone disease    Patient denies  . Mitral stenosis   . Myocardial infarction ([place])   . Pneumonia   . Pulmonary edema   . Solitary rectal ulcer syndrome 07/2017   at [facility] for rectal bleeding  . Tubular adenoma of colon     Family History: Mother and father with heart disease.  Social History: Lives in [place].  Currently denies EtOH use.  Review of Systems: A complete ROS was negative except as per HPI.   Physical Exam: Blood pressure (!) 143/68, pulse 72, temperature 98.1 F (36.7 C), temperature source Oral, resp. rate 17, height 5\' 10"  (1.778 m), weight 67 kg, SpO2 100 %. Physical Exam  Constitutional: He is oriented to person, place, and time and well-developed, well-nourished, and in no distress. No distress.  HENT:  Head: Normocephalic and atraumatic.  Neck: Neck supple.  Cardiovascular: Normal rate and regular rhythm.  No  murmur heard. Pulmonary/Chest: Effort normal and breath sounds normal. He has no wheezes. He has no rales.  Abdominal: Bowel sounds are normal. He exhibits distension (Due to ascites).  Musculoskeletal: Normal range of motion.        General: No tenderness, deformity or edema.  Neurological: He is alert and oriented to person, place, and time.  Skin: He is not diaphoretic.  Psychiatric: Mood, memory, affect and judgment normal.    EKG: Pending   CXR: None ordered  Assessment & Plan by Problem: Principal Problem:   Dyspnea Active Problems:   Hyperkalemia   ESRD on dialysis (HCC)   Abdominal distention   A-fib [facility])  [name] is a  58 y.o.  male w/ PMH significant for ESRD on HD MWF,  recurrent ascites due to decompensated cirrhosis and history of SBP, paroxysmal atrial fibrillation, COPD, chronic normocytic anemia due to renal disease, and CAD s/p stent placement in 2018 here with shortness of breath after missing dialysis for the past week.   #ESRD on HD MWF: Unfortunately he has not been able to undergo dialysis for the past week as he had been to the emergency department to undergo paracentesis on 11/26.  He presented to the ED again on 1127 with abdominal distention however bedside ultrasound did not reveal significant ascites and thus did not undergo paracentesis.  Nephrology was consulted and plans to dialyze patient in the a.m. -HD session in the a.m. -Continue cardiac monitoring -Follow-up renal function panel -Continue Renvela   #Mild hyperkalemia: K+ of 5.8.  EKG currently pending.  He is status post Lokelma -Follow BMP   #Decompensated cirrhosis secondary to EtOH use, chronic hepatitis B and C: He undergoes serial paracentesis with last paracentesis performed in the ED on 06/07/2019.  He had a total of 2.8 L of fluid removed.  On examination today, abdomen looks distended and reports of pain at the prior needle insertion site.  Currently, there is no  objective evidence of SBP as he remains afebrile and without leukocytosis.  Thus, will not initiate antibiotics for SBP prophylaxis.   #Anemia of chronic disease: Hemoglobin stable at 9.1 (baseline 9.5-10) -Continue to monitor   #Hypertension: Continue hydralazine, metoprolol   #Atrial fibrillation: Continue Metoprolol   #COPD: Continue home inhalers  FEN: Renal diet VTE ppx: Subcutaneous heparin CODE STATUS: DNR  Dispo: Admit patient to Observation with expected length of stay less than 2 midnights.  Signed: Jean Rosenthal, MD 06/09/2019, 5:28 PM  Pager: [phone] Internal Medicine Teaching Service

## 2019-06-09 NOTE — ED Notes (Signed)
Patient verbalized understanding of discharge instructions. Opportunities for questioning and answers were provided. Armband removed by staff. Patient removed from ED.   

## 2019-06-09 NOTE — ED Triage Notes (Addendum)
Pt came in today POV c/o of needing dialysis. He said "I was discharged yesterday (06/08/2019) with a potassium of 5.3". Did not want blood to be drawn.

## 2019-06-09 NOTE — ED Notes (Signed)
IV attempt x 1 to [facility] unsuccessful, able to draw labs but then infiltrated.   Pt states that he wants IV pain meds, but will start with PO for now.

## 2019-06-09 NOTE — Consult Note (Signed)
[facility] Renal Consultation Note    Indication for Consultation:  Management of ESRD/hemodialysis, anemia, hypertension/volume, and secondary hyperparathyroidism.  HPI: [name] is a 58 y.o. male with a history of ESRD on dialysis, recurrent ascites 2/2 cirrhosis, COPD and CAD who presented to the ED today "for dialysis." Patient has been to the ED several times over the past several days.   11/26: presented for SOB, underwent thoracentesis.  11/27: presented with abdominal pain, not enough fluid for repeat paracentesis. Pt had missed HD and was advised to reschedule treatment for today.  11/28: Presented this AM because HD outpatient HD unit did not have any open slots for make up treatment. Admission advised for HD tomorrow AM due to high inpatient dialysis census. Pt declined but returned this afternoon for dialysis.   K+ this AM was 5.8, BUN 52, Cr 10.55, Ca 10.0. Patient declined admission but was given lokelma. K+ has not yet been repeated. CXR from 11/27 with no acute cardiopulmonary process. COVID testing 11/26 negative. Patient reports abdominal pain. Denies nausea, vomiting, diarrhea. No SOB, orthopnea, cough, CP, palpitations, or dizziness. Pt is requesting something to eat. He is agreeable to be admitted to receive HD late tonight/tomorrow morning depending on dialysis schedule.   Past Medical History:  Diagnosis Date  . Anemia   . Anxiety   . Arthritis    left shoulder  . Atherosclerosis of aorta ([place])   . Cardiomegaly   . Chest pain    DATE UNKNOWN, C/O PERIODICALLY  . Cocaine abuse ([place])   . COPD exacerbation ([place]) 08/17/2016  . Coronary artery disease    stent 02/22/17  . ESRD (end stage renal disease) on dialysis ([place])    "[name]; MWF" (07/04/2017)  . GERD (gastroesophageal reflux disease)    DATE UNKNOWN  . Hemorrhoids   . Hepatitis B, chronic ([place])   . Hepatitis C   . History of kidney stones   . Hyperkalemia   . Hypertension    . Kidney failure   . Metabolic bone disease    Patient denies  . Mitral stenosis   . Myocardial infarction ([place])   . Pneumonia   . Pulmonary edema   . Solitary rectal ulcer syndrome 07/2017   at flex sig for rectal bleeding  . Tubular adenoma of colon    Past Surgical History:  Procedure Laterality Date  . A/V FISTULAGRAM Left 05/26/2017   Procedure: A/V FISTULAGRAM;  Surgeon: Conrad , MD;  Location: [place] [facility];  Service: Cardiovascular;  Laterality: Left;  . A/V FISTULAGRAM Right 11/18/2017   Procedure: A/V FISTULAGRAM - Right Arm;  Surgeon: Elam Dutch, MD;  Location: [place] [facility];  Service: Cardiovascular;  Laterality: Right;  . APPLICATION OF WOUND VAC Left 06/14/2017   Procedure: APPLICATION OF WOUND VAC;  Surgeon: Katha Cabal, MD;  Location: [facility];  Service: Vascular;  Laterality: Left;  . AV FISTULA PLACEMENT  2012   BELIEVED WAS PLACED IN JUNE  . AV FISTULA PLACEMENT Right 08/09/2017   Procedure: Creation Right arm ARTERIOVENOUS BRACHIOCEPOHALIC FISTULA;  Surgeon: Elam Dutch, MD;  Location: [facility] OR;  Service: Vascular;  Laterality: Right;  . AV FISTULA PLACEMENT Right 11/22/2017   Procedure: INSERTION OF ARTERIOVENOUS (AV) GORE-TEX GRAFT RIGHT UPPER ARM;  Surgeon: Elam Dutch, MD;  Location: [place];  Service: Vascular;  Laterality: Right;  . BIOPSY  01/25/2018   Procedure: BIOPSY;  Surgeon: Jerene Bears, MD;  Location: [place] [facility];  Service: Gastroenterology;;  . BIOPSY  04/10/2019   Procedure: BIOPSY;  Surgeon: Jerene Bears, MD;  Location: [facility];  Service: Gastroenterology;;  . COLONOSCOPY    . COLONOSCOPY WITH PROPOFOL N/A 01/25/2018   Procedure: COLONOSCOPY WITH PROPOFOL;  Surgeon: Jerene Bears, MD;  Location: [place];  Service: Gastroenterology;  Laterality: N/A;  . CORONARY STENT INTERVENTION N/A 02/22/2017   Procedure: CORONARY STENT INTERVENTION;  Surgeon: Nigel Mormon, MD;  Location: [place] [facility];   Service: Cardiovascular;  Laterality: N/A;  . ESOPHAGOGASTRODUODENOSCOPY (EGD) WITH PROPOFOL N/A 01/25/2018   Procedure: ESOPHAGOGASTRODUODENOSCOPY (EGD) WITH PROPOFOL;  Surgeon: Jerene Bears, MD;  Location: [place];  Service: Gastroenterology;  Laterality: N/A;  . ESOPHAGOGASTRODUODENOSCOPY (EGD) WITH PROPOFOL N/A 04/10/2019   Procedure: ESOPHAGOGASTRODUODENOSCOPY (EGD) WITH PROPOFOL;  Surgeon: Jerene Bears, MD;  Location: [facility];  Service: Gastroenterology;  Laterality: N/A;  . FLEXIBLE SIGMOIDOSCOPY N/A 07/15/2017   Procedure: FLEXIBLE SIGMOIDOSCOPY;  Surgeon: Carol Ada, MD;  Location: [place];  Service: Endoscopy;  Laterality: N/A;  . HEMORRHOID BANDING    . I&D EXTREMITY Left 06/01/2017   Procedure: IRRIGATION AND DEBRIDEMENT LEFT ARM HEMATOMA WITH LIGATION OF LEFT ARM AV FISTULA;  Surgeon: Elam Dutch, MD;  Location: [place];  Service: Vascular;  Laterality: Left;  . I&D EXTREMITY Left 06/14/2017   Procedure: IRRIGATION AND DEBRIDEMENT EXTREMITY;  Surgeon: Katha Cabal, MD;  Location: [facility];  Service: Vascular;  Laterality: Left;  . INSERTION OF DIALYSIS CATHETER  05/30/2017  . INSERTION OF DIALYSIS CATHETER N/A 05/30/2017   Procedure: INSERTION OF DIALYSIS CATHETER;  Surgeon: Elam Dutch, MD;  Location: [place];  Service: Vascular;  Laterality: N/A;  . IR PARACENTESIS  08/30/2017  . IR PARACENTESIS  09/29/2017  . IR PARACENTESIS  10/28/2017  . IR PARACENTESIS  11/09/2017  . IR PARACENTESIS  11/16/2017  . IR PARACENTESIS  11/28/2017  . IR PARACENTESIS  12/01/2017  . IR PARACENTESIS  12/06/2017  . IR PARACENTESIS  01/03/2018  . IR PARACENTESIS  01/23/2018  . IR PARACENTESIS  02/07/2018  . IR PARACENTESIS  02/21/2018  . IR PARACENTESIS  03/06/2018  . IR PARACENTESIS  03/17/2018  . IR PARACENTESIS  04/04/2018  . IR PARACENTESIS  12/28/2018  . IR PARACENTESIS  01/08/2019  . IR PARACENTESIS  01/23/2019  . IR PARACENTESIS  02/01/2019  . IR PARACENTESIS  02/19/2019  . IR  PARACENTESIS  03/01/2019  . IR PARACENTESIS  03/15/2019  . IR PARACENTESIS  04/03/2019  . IR PARACENTESIS  04/12/2019  . IR PARACENTESIS  05/01/2019  . IR PARACENTESIS  05/08/2019  . IR PARACENTESIS  05/24/2019  . IR RADIOLOGIST EVAL & MGMT  02/14/2018  . IR RADIOLOGIST EVAL & MGMT  02/22/2019  . LEFT HEART CATH AND CORONARY ANGIOGRAPHY N/A 02/22/2017   Procedure: LEFT HEART CATH AND CORONARY ANGIOGRAPHY;  Surgeon: Nigel Mormon, MD;  Location: [place] [facility];  Service: Cardiovascular;  Laterality: N/A;  . LIGATION OF ARTERIOVENOUS  FISTULA Left 11/11/9765   Procedure: Plication of Left Arm Arteriovenous Fistula;  Surgeon: Elam Dutch, MD;  Location: [place];  Service: Vascular;  Laterality: Left;  . POLYPECTOMY    . POLYPECTOMY  01/25/2018   Procedure: POLYPECTOMY;  Surgeon: Jerene Bears, MD;  Location: [place];  Service: Gastroenterology;;  . REVISON OF ARTERIOVENOUS FISTULA Left 3/41/9379   Procedure: PLICATION OF DISTAL ANEURYSMAL SEGEMENT OF LEFT UPPER ARM ARTERIOVENOUS FISTULA;  Surgeon: Elam Dutch, MD;  Location: [facility]  OR;  Service: Vascular;  Laterality: Left;  . REVISON OF ARTERIOVENOUS FISTULA Left 4/48/1856   Procedure: Plication of Left Upper Arm Fistula ;  Surgeon: Waynetta Sandy, MD;  Location: [place];  Service: Vascular;  Laterality: Left;  . SKIN GRAFT SPLIT THICKNESS LEG / FOOT Left    SKIN GRAFT SPLIT THICKNESS LEFT ARM DONOR SITE: LEFT ANTERIOR THIGH  . SKIN SPLIT GRAFT Left 07/04/2017   Procedure: SKIN GRAFT SPLIT THICKNESS LEFT ARM DONOR SITE: LEFT ANTERIOR THIGH;  Surgeon: Elam Dutch, MD;  Location: [place];  Service: Vascular;  Laterality: Left;  . THROMBECTOMY W/ EMBOLECTOMY Left 06/05/2017   Procedure: EXPLORATION OF LEFT ARM FOR BLEEDING; OVERSEWED PROXIMAL FISTULA;  Surgeon: Angelia Mould, MD;  Location: [place];  Service: Vascular;  Laterality: Left;  . WOUND EXPLORATION Left 06/03/2017   Procedure: WOUND EXPLORATION WITH WOUND  VAC APPLICATION TO LEFT ARM;  Surgeon: Angelia Mould, MD;  Location: [facility] OR;  Service: Vascular;  Laterality: Left;   Family History  Problem Relation Age of Onset  . Heart disease Mother   . Lung cancer Mother   . Heart disease Father   . Malignant hyperthermia Father   . COPD Father   . Throat cancer Sister   . Esophageal cancer Sister   . Hypertension Other   . COPD Other   . Colon cancer Neg Hx   . Colon polyps Neg Hx   . Rectal cancer Neg Hx   . Stomach cancer Neg Hx    Social History:  reports that he has been smoking cigarettes. He started smoking about 45 years ago. He has a 21.50 pack-year smoking history. He has never used smokeless tobacco. He reports previous alcohol use. He reports previous drug use. Drugs: Marijuana and Cocaine.  ROS: As per HPI otherwise negative.  Physical Exam: Vitals:   06/09/19 1504 06/09/19 1505  BP:  (!) 143/68  Pulse:  72  Resp:  17  Temp:  98.1 F (36.7 C)  TempSrc:  Oral  SpO2:  100%  Weight: 67 kg   Height: 5\' 10"  (1.778 m)      General: Well developed, well nourished, in no acute distress. Head: Normocephalic, atraumatic, sclera non-icteric, mucus membranes are moist. Neck:JVD not elevated. Lungs: Clear bilaterally to auscultation without wheezes, rales, or rhonchi. Breathing is unlabored on RA. Heart: RRR with normal S1, S2. No murmurs, rubs, or gallops appreciated. Abdomen: Distended but soft, non-tender with normoactive bowel sounds. No rebound/guarding. No obvious abdominal masses. Musculoskeletal:  Strength and tone appear normal for age. Lower extremities: No edema or ischemic changes, no open wounds. Neuro: Alert and oriented X 3. Moves all extremities spontaneously. Psych:  Responds to questions appropriately with a normal affect. Dialysis Access: RUE AVF + bruit  Allergies  Allergen Reactions  . Morphine And Related Other (See Comments)    Stomach pain  . Aspirin Other (See Comments)    STOMACH PAIN  .  Clonidine Derivatives Itching  . Tramadol Itching  . Tylenol [Acetaminophen] Nausea Only    Stomach ache   Prior to Admission medications   Medication Sig Start Date End Date Taking? Authorizing Provider  albuterol (VENTOLIN HFA) 108 (90 Base) MCG/ACT inhaler Inhale 2 puffs into the lungs every 6 (six) hours as needed for wheezing or shortness of breath.    [provider]  hydrALAZINE (APRESOLINE) 100 MG tablet Take 0.5 tablets (50 mg total) by mouth 3 (three) times daily. 05/31/19   Modena Nunnery D,  DO  lactulose (CHRONULAC) 10 GM/15ML solution TAKE 45 MLS BY MOUTH 2 TIMES DAILY. 06/04/19   Al Decant, MD  metoprolol tartrate (LOPRESSOR) 100 MG tablet Take 1 tablet (100 mg total) by mouth 2 (two) times daily. 06/04/19   Al Decant, MD  montelukast (SINGULAIR) 10 MG tablet Take 10 mg by mouth every evening.     [provider]  ondansetron (ZOFRAN) 4 MG tablet Take 1 tablet (4 mg total) by mouth daily as needed for up to 10 days for nausea or vomiting. 06/05/19 06/15/19  Maudie Mercury, MD  pantoprazole (PROTONIX) 40 MG tablet Take 1 tablet (40 mg total) by mouth daily. 06/05/19   Maudie Mercury, MD  sevelamer carbonate (RENVELA) 800 MG tablet Take 4 tablets with meals  And 2 tablets twice daily with snacks Patient taking differently: Take 1,600 mg by mouth See admin instructions. Take 2 tablets (1600mg ) with meals & with snacks 01/22/18   Patrecia Pour, MD  sulfamethoxazole-trimethoprim (BACTRIM DS) 800-160 MG tablet Take 1 tablet by mouth daily. Patient taking differently: Take 1 tablet by mouth 2 (two) times daily.  12/30/18   Geradine Girt, DO  SYMBICORT 160-4.5 MCG/ACT inhaler Inhale 2 puffs into the lungs 2 (two) times daily.  11/30/18   [provider]  zolpidem (AMBIEN) 10 MG tablet Take 10 mg by mouth at bedtime as needed for sleep.    [provider]   Current Facility-Administered Medications  Medication Dose Route Frequency Provider  Last Rate Last Dose  . albumin human 25 % solution 50 g  50 g Intravenous UD Pyrtle, Lajuan Lines, MD       Current Outpatient Medications  Medication Sig Dispense Refill  . albuterol (VENTOLIN HFA) 108 (90 Base) MCG/ACT inhaler Inhale 2 puffs into the lungs every 6 (six) hours as needed for wheezing or shortness of breath.    . hydrALAZINE (APRESOLINE) 100 MG tablet Take 0.5 tablets (50 mg total) by mouth 3 (three) times daily. 150 tablet 0  . lactulose (CHRONULAC) 10 GM/15ML solution TAKE 45 MLS BY MOUTH 2 TIMES DAILY. 8100 mL 0  . metoprolol tartrate (LOPRESSOR) 100 MG tablet Take 1 tablet (100 mg total) by mouth 2 (two) times daily. 60 tablet 0  . montelukast (SINGULAIR) 10 MG tablet Take 10 mg by mouth every evening.     . ondansetron (ZOFRAN) 4 MG tablet Take 1 tablet (4 mg total) by mouth daily as needed for up to 10 days for nausea or vomiting. 10 tablet 1  . pantoprazole (PROTONIX) 40 MG tablet Take 1 tablet (40 mg total) by mouth daily. 90 tablet 3  . sevelamer carbonate (RENVELA) 800 MG tablet Take 4 tablets with meals  And 2 tablets twice daily with snacks (Patient taking differently: Take 1,600 mg by mouth See admin instructions. Take 2 tablets (1600mg ) with meals & with snacks)    . sulfamethoxazole-trimethoprim (BACTRIM DS) 800-160 MG tablet Take 1 tablet by mouth daily. (Patient taking differently: Take 1 tablet by mouth 2 (two) times daily. ) 30 tablet 11  . SYMBICORT 160-4.5 MCG/ACT inhaler Inhale 2 puffs into the lungs 2 (two) times daily.     Marland Kitchen zolpidem (AMBIEN) 10 MG tablet Take 10 mg by mouth at bedtime as needed for sleep.     Labs: Basic Metabolic Panel: Recent Labs  Lab 06/03/19 0800 06/07/19 1658 06/08/19 1601 06/09/19 1008  NA 136 139 139 135  K 4.9 4.6 5.3* 5.8*  CL 96* 98 99 95*  CO2 29 28 25 25   GLUCOSE 99 104* 86 102*  BUN 34* 33* 44* 52*  CREATININE 6.99* 8.15* 9.62* 10.55*  CALCIUM 9.6 10.0 9.8 10.0  PHOS 2.9  --   --   --    Liver Function Tests: Recent  Labs  Lab 06/03/19 0800 06/07/19 1658 06/08/19 1601  AST  --  37 35  ALT  --  29 27  ALKPHOS  --  129* 126  BILITOT  --  1.0 1.0  PROT  --  6.6 6.2*  ALBUMIN 3.1* 3.3* 3.4*   Recent Labs  Lab 06/07/19 1658 06/08/19 1601  LIPASE 35 29   CBC: Recent Labs  Lab 06/07/19 1658 06/08/19 1601 06/09/19 1008  WBC 9.4 9.1 8.9  NEUTROABS 6.8 6.3 6.6  HGB 9.5* 8.8* 9.1*  HCT 26.8* 25.2* 26.1*  MCV 78.1* 78.0* 78.6*  PLT 193 179 171   Studies/Results: Dg Chest Portable 1 View  Result Date: 06/08/2019 CLINICAL DATA:  Patient with abdominal distension. EXAM: PORTABLE CHEST 1 VIEW COMPARISON:  Chest radiograph 06/07/2019 FINDINGS: Stable cardiomegaly. No large area of pulmonary consolidation. No pleural effusion or pneumothorax. Regional skeleton is unremarkable. IMPRESSION: No acute cardiopulmonary process.  Cardiomegaly. Electronically Signed   By: Lovey Newcomer M.D.   On: 06/08/2019 16:20   Dg Chest Port 1 View  Result Date: 06/07/2019 CLINICAL DATA:  Short of breath. EXAM: PORTABLE CHEST 1 VIEW COMPARISON:  06/02/2019 FINDINGS: Mild enlargement of the cardiopericardial silhouette is stable. No mediastinal or hilar masses. Prominent bronchovascular markings, stable. No evidence of pneumonia or pulmonary edema. No pleural effusion or pneumothorax. Skeletal structures are grossly intact. IMPRESSION: No acute cardiopulmonary disease. Electronically Signed   By: Lajean Manes M.D.   On: 06/07/2019 17:34    Dialysis Orders: Center: [facility]  on MWF. Time: 4 hours, 180NRe, BFR 450, DFR 800, EDW 65kg, 2K/2Ca - last HD 11/24 (usually runs about 2.5 hours)  Mircera 50 mcg IV q 2 weeks- last dose 75 mcg on 04/27/19 Parsabiv 2.5 mg IV q HD Calcitriol 2.5 mcg PO q HD Renvela 4 tabs PO TID with meals and 2/snack No heparin  Assessment/Plan: 1.  ESRD:  MWF schedule. Last dialysis was 11/24. Patient chronically shortens his treatments. K+ 5.8 before dose of kayexalate, will  repeat RFP. No SOB and CXR yesterday clear. No indication for emergent HD. High inpatient HD census so may be unable to dialyze today, will likely late tonight/early AM. Compliance with outpatient HD encouraged. 2. Recurrent ascites: Last paracentesis 06/07/19 with 2.8L removed. Further volume management with HD.  3.  Hypertension/volume: BP slightly elevated. Continue home meds. 2kg above EDW, UF with HD tolerated. 4.  Anemia: Hemoglobin 9.1. Mircera last dosed 10/16, will order aranesp with HD.  5.  Metabolic bone disease: Corrected calcium high. Hold calcitriol. Parsabiv not on formulary in the hospital- will resume at discharge. Continue renvela.  6.  Nutrition:  Renal diet with fluid restrictions.  Anice Paganini, PA-C 06/09/2019, 4:00 PM  [facility] Pager: [phone]

## 2019-06-09 NOTE — ED Provider Notes (Signed)
[facility] EMERGENCY DEPARTMENT Provider Note   CSN: [id number] Arrival date & time: 06/09/19  3818     History   Chief Complaint Chief Complaint  Patient presents with  . Needs Dialysis    HPI [name] is a 58 y.o. male.     The history is provided by the patient and medical records. No language interpreter was used.     58 year old male with multiple medical problems including end-stage renal disease currently on Monday/Wednesday/Friday dialysis, recurrent abdominal ascites secondary to liver cirrhosis, COPD, CAD presenting requesting for dialysis.  Patient report his last dialysis was Tuesday which was 5 days ago.  He was seen in ED yesterday for abdominal discomfort due to chronic anxiety and states that he did miss his dialysis session due to that.  He was told to contact his dialysis center today for dialysis session however there was no bed available.  He is here requesting to be dialyzed.  Does not report any significant shortness of breath there is more than usual.  He mentioned that his last potassium from yesterday was 5.3.  No complaints of chest pain.  No fever chills.  Patient received dialysis at [facility] dialysis center.  Past Medical History:  Diagnosis Date  . Anemia   . Anxiety   . Arthritis    left shoulder  . Atherosclerosis of aorta ([place])   . Cardiomegaly   . Chest pain    DATE UNKNOWN, C/O PERIODICALLY  . Cocaine abuse ([place])   . COPD exacerbation ([place]) 08/17/2016  . Coronary artery disease    stent 02/22/17  . ESRD (end stage renal disease) on dialysis ([place])    "[name]; MWF" (07/04/2017)  . GERD (gastroesophageal reflux disease)    DATE UNKNOWN  . Hemorrhoids   . Hepatitis B, chronic ([place])   . Hepatitis C   . History of kidney stones   . Hyperkalemia   . Hypertension   . Kidney failure   . Metabolic bone disease    Patient denies  . Mitral stenosis   . Myocardial infarction ([place])   . Pneumonia   .  Pulmonary edema   . Solitary rectal ulcer syndrome 07/2017   at flex sig for rectal bleeding  . Tubular adenoma of colon     Patient Active Problem List   Diagnosis Date Noted  . Acquired thrombophilia ([place]) 06/05/2019  . A-fib ([place]) 05/30/2019  . Atrial fibrillation with RVR ([place]) 05/29/2019  . Melena   . Pressure injury of skin 03/09/2019  . Abdominal distention   . Volume overload 12/28/2018  . Sepsis ([place]) 09/12/2018  . Atherosclerosis of native coronary artery of native heart without angina pectoris 03/11/2018  . Benign neoplasm of cecum   . Benign neoplasm of ascending colon   . Benign neoplasm of descending colon   . Benign neoplasm of rectum   . Paroxysmal atrial fibrillation ([place]) 01/23/2018  . Hx of colonic polyps 01/20/2018  . ESRD (end stage renal disease) ([place]) 11/21/2017  . GERD (gastroesophageal reflux disease) 11/16/2017  . Liver cirrhosis ([place]) 11/15/2017  . DNR (do not resuscitate)   . Palliative care by specialist   . Hyponatremia 11/04/2017  . SBP (spontaneous bacterial peritonitis) ([place]) 10/30/2017  . Liver disease, chronic 10/30/2017  . SOB (shortness of breath)   . Abdominal pain 10/28/2017  . Upper airway cough syndrome with flattening on f/v loop 10/13/17 c/w vcd 10/17/2017  . Elevated diaphragm 10/13/2017  . Ileus ([place]) 09/29/2017  .  Malnutrition of moderate degree 09/29/2017  . Sinus congestion 09/03/2017  . Symptomatic anemia 09/02/2017  . Other cirrhosis of liver ([place]) 09/02/2017  . Left bundle branch block 09/02/2017  . Mitral stenosis 09/02/2017  . Hematochezia 07/15/2017  . Wide-complex tachycardia ([place])   . Endotracheally intubated   . ESRD on dialysis ([place]) 07/04/2017  . Acute respiratory failure with hypoxia ([place]) 06/18/2017  . CKD (chronic kidney disease) stage V requiring chronic dialysis ([place]) 06/18/2017  . History of Cocaine abuse ([place]) 06/18/2017  . Hypertension 06/18/2017  . Infection of AV graft for dialysis ([place]) 06/18/2017   . Anxiety 06/18/2017  . Anemia due to chronic kidney disease 06/18/2017  . Atypical atrial flutter ([place]) 06/18/2017  . Personality disorder ([place]) 06/13/2017  . Cellulitis 06/12/2017  . Adjustment disorder with mixed anxiety and depressed mood 06/10/2017  . Suicidal ideation 06/10/2017  . Arm wound, left, sequela 06/10/2017  . Dyspnea on exertion 05/29/2017  . Tachycardia 05/29/2017  . Hyperkalemia 05/22/2017  . Acute metabolic encephalopathy   . Anemia 04/23/2017  . Ascites 04/23/2017  . COPD (chronic obstructive pulmonary disease) ([place]) 04/23/2017  . Acute on chronic respiratory failure with hypoxia ([place]) 03/25/2017  . Arrhythmia 03/25/2017  . COPD GOLD 0 with flattening on inps f/v  09/27/2016  . Essential hypertension 09/27/2016  . Fluid overload 08/30/2016  . COPD exacerbation ([place]) 08/17/2016  . Hypertensive urgency 08/17/2016  . Respiratory failure ([place]) 08/17/2016  . Problem with dialysis access ([place]) 07/23/2016  . Chronic hepatitis B ([place]) 03/05/2014  . Chronic hepatitis C without hepatic coma ([place]) 03/05/2014  . Internal hemorrhoids with bleeding, swelling and itching 03/05/2014  . Thrombocytopenia ([place]) 03/05/2014  . Chest pain 02/27/2014  . Alcohol abuse 04/14/2009  . Cigarette smoker 04/14/2009  . GANGLION CYST 04/14/2009    Past Surgical History:  Procedure Laterality Date  . A/V FISTULAGRAM Left 05/26/2017   Procedure: A/V FISTULAGRAM;  Surgeon: Conrad Spickard, MD;  Location: [place] [facility];  Service: Cardiovascular;  Laterality: Left;  . A/V FISTULAGRAM Right 11/18/2017   Procedure: A/V FISTULAGRAM - Right Arm;  Surgeon: Elam Dutch, MD;  Location: [facility] [facility];  Service: Cardiovascular;  Laterality: Right;  . APPLICATION OF WOUND VAC Left 06/14/2017   Procedure: APPLICATION OF WOUND VAC;  Surgeon: Katha Cabal, MD;  Location: [facility];  Service: Vascular;  Laterality: Left;  . AV FISTULA PLACEMENT  2012   BELIEVED WAS PLACED IN JUNE  .  AV FISTULA PLACEMENT Right 08/09/2017   Procedure: Creation Right arm ARTERIOVENOUS BRACHIOCEPOHALIC FISTULA;  Surgeon: Elam Dutch, MD;  Location: [facility] OR;  Service: Vascular;  Laterality: Right;  . AV FISTULA PLACEMENT Right 11/22/2017   Procedure: INSERTION OF ARTERIOVENOUS (AV) GORE-TEX GRAFT RIGHT UPPER ARM;  Surgeon: Elam Dutch, MD;  Location: [facility];  Service: Vascular;  Laterality: Right;  . BIOPSY  01/25/2018   Procedure: BIOPSY;  Surgeon: Jerene Bears, MD;  Location: [facility];  Service: Gastroenterology;;  . BIOPSY  04/10/2019   Procedure: BIOPSY;  Surgeon: Jerene Bears, MD;  Location: [facility];  Service: Gastroenterology;;  . COLONOSCOPY    . COLONOSCOPY WITH PROPOFOL N/A 01/25/2018   Procedure: COLONOSCOPY WITH PROPOFOL;  Surgeon: Jerene Bears, MD;  Location: [facility];  Service: Gastroenterology;  Laterality: N/A;  . CORONARY STENT INTERVENTION N/A 02/22/2017   Procedure: CORONARY STENT INTERVENTION;  Surgeon: Nigel Mormon, MD;  Location: [facility];  Service: Cardiovascular;  Laterality: N/A;  .  ESOPHAGOGASTRODUODENOSCOPY (EGD) WITH PROPOFOL N/A 01/25/2018   Procedure: ESOPHAGOGASTRODUODENOSCOPY (EGD) WITH PROPOFOL;  Surgeon: Jerene Bears, MD;  Location: [place];  Service: Gastroenterology;  Laterality: N/A;  . ESOPHAGOGASTRODUODENOSCOPY (EGD) WITH PROPOFOL N/A 04/10/2019   Procedure: ESOPHAGOGASTRODUODENOSCOPY (EGD) WITH PROPOFOL;  Surgeon: Jerene Bears, MD;  Location: [facility];  Service: Gastroenterology;  Laterality: N/A;  . FLEXIBLE SIGMOIDOSCOPY N/A 07/15/2017   Procedure: FLEXIBLE SIGMOIDOSCOPY;  Surgeon: Carol Ada, MD;  Location: [facility];  Service: Endoscopy;  Laterality: N/A;  . HEMORRHOID BANDING    . I&D EXTREMITY Left 06/01/2017   Procedure: IRRIGATION AND DEBRIDEMENT LEFT ARM HEMATOMA WITH LIGATION OF LEFT ARM AV FISTULA;  Surgeon: Elam Dutch, MD;  Location: [facility];  Service: Vascular;  Laterality: Left;  . I&D  EXTREMITY Left 06/14/2017   Procedure: IRRIGATION AND DEBRIDEMENT EXTREMITY;  Surgeon: Katha Cabal, MD;  Location: [facility];  Service: Vascular;  Laterality: Left;  . INSERTION OF DIALYSIS CATHETER  05/30/2017  . INSERTION OF DIALYSIS CATHETER N/A 05/30/2017   Procedure: INSERTION OF DIALYSIS CATHETER;  Surgeon: Elam Dutch, MD;  Location: [facility];  Service: Vascular;  Laterality: N/A;  . IR PARACENTESIS  08/30/2017  . IR PARACENTESIS  09/29/2017  . IR PARACENTESIS  10/28/2017  . IR PARACENTESIS  11/09/2017  . IR PARACENTESIS  11/16/2017  . IR PARACENTESIS  11/28/2017  . IR PARACENTESIS  12/01/2017  . IR PARACENTESIS  12/06/2017  . IR PARACENTESIS  01/03/2018  . IR PARACENTESIS  01/23/2018  . IR PARACENTESIS  02/07/2018  . IR PARACENTESIS  02/21/2018  . IR PARACENTESIS  03/06/2018  . IR PARACENTESIS  03/17/2018  . IR PARACENTESIS  04/04/2018  . IR PARACENTESIS  12/28/2018  . IR PARACENTESIS  01/08/2019  . IR PARACENTESIS  01/23/2019  . IR PARACENTESIS  02/01/2019  . IR PARACENTESIS  02/19/2019  . IR PARACENTESIS  03/01/2019  . IR PARACENTESIS  03/15/2019  . IR PARACENTESIS  04/03/2019  . IR PARACENTESIS  04/12/2019  . IR PARACENTESIS  05/01/2019  . IR PARACENTESIS  05/08/2019  . IR PARACENTESIS  05/24/2019  . IR RADIOLOGIST EVAL & MGMT  02/14/2018  . IR RADIOLOGIST EVAL & MGMT  02/22/2019  . LEFT HEART CATH AND CORONARY ANGIOGRAPHY N/A 02/22/2017   Procedure: LEFT HEART CATH AND CORONARY ANGIOGRAPHY;  Surgeon: Nigel Mormon, MD;  Location: [facility];  Service: Cardiovascular;  Laterality: N/A;  . LIGATION OF ARTERIOVENOUS  FISTULA Left 07/19/5629   Procedure: Plication of Left Arm Arteriovenous Fistula;  Surgeon: Elam Dutch, MD;  Location: [facility];  Service: Vascular;  Laterality: Left;  . POLYPECTOMY    . POLYPECTOMY  01/25/2018   Procedure: POLYPECTOMY;  Surgeon: Jerene Bears, MD;  Location: [facility];  Service: Gastroenterology;;  . REVISON OF ARTERIOVENOUS FISTULA Left  4/97/0263   Procedure: PLICATION OF DISTAL ANEURYSMAL SEGEMENT OF LEFT UPPER ARM ARTERIOVENOUS FISTULA;  Surgeon: Elam Dutch, MD;  Location: [facility];  Service: Vascular;  Laterality: Left;  . REVISON OF ARTERIOVENOUS FISTULA Left 7/85/8850   Procedure: Plication of Left Upper Arm Fistula ;  Surgeon: Waynetta Sandy, MD;  Location: [facility];  Service: Vascular;  Laterality: Left;  . SKIN GRAFT SPLIT THICKNESS LEG / FOOT Left    SKIN GRAFT SPLIT THICKNESS LEFT ARM DONOR SITE: LEFT ANTERIOR THIGH  . SKIN SPLIT GRAFT Left 07/04/2017   Procedure: SKIN GRAFT SPLIT THICKNESS LEFT ARM DONOR SITE: LEFT ANTERIOR THIGH;  Surgeon: Elam Dutch,  MD;  Location: [facility];  Service: Vascular;  Laterality: Left;  . THROMBECTOMY W/ EMBOLECTOMY Left 06/05/2017   Procedure: EXPLORATION OF LEFT ARM FOR BLEEDING; OVERSEWED PROXIMAL FISTULA;  Surgeon: Angelia Mould, MD;  Location: [facility];  Service: Vascular;  Laterality: Left;  . WOUND EXPLORATION Left 06/03/2017   Procedure: WOUND EXPLORATION WITH WOUND VAC APPLICATION TO LEFT ARM;  Surgeon: Angelia Mould, MD;  Location: [facility];  Service: Vascular;  Laterality: Left;        Home Medications    Prior to Admission medications   Medication Sig Start Date End Date Taking? Authorizing Provider  albuterol (VENTOLIN HFA) 108 (90 Base) MCG/ACT inhaler Inhale 2 puffs into the lungs every 6 (six) hours as needed for wheezing or shortness of breath.    [provider]  hydrALAZINE (APRESOLINE) 100 MG tablet Take 0.5 tablets (50 mg total) by mouth 3 (three) times daily. 05/31/19   [place]omfield, Carley D, DO  lactulose (CHRONULAC) 10 GM/15ML solution TAKE 45 MLS BY MOUTH 2 TIMES DAILY. 06/04/19   Al Decant, MD  metoprolol tartrate (LOPRESSOR) 100 MG tablet Take 1 tablet (100 mg total) by mouth 2 (two) times daily. 06/04/19   Al Decant, MD  montelukast (SINGULAIR) 10 MG tablet Take 10 mg by mouth every evening.     [provider]  ondansetron (ZOFRAN) 4 MG tablet Take 1 tablet (4 mg total) by mouth daily as needed for up to 10 days for nausea or vomiting. 06/05/19 06/15/19  Maudie Mercury, MD  pantoprazole (PROTONIX) 40 MG tablet Take 1 tablet (40 mg total) by mouth daily. 06/05/19   Maudie Mercury, MD  sevelamer carbonate (RENVELA) 800 MG tablet Take 4 tablets with meals  And 2 tablets twice daily with snacks Patient taking differently: Take 1,600 mg by mouth See admin instructions. Take 2 tablets (1600mg ) with meals & with snacks 01/22/18   Patrecia Pour, MD  sulfamethoxazole-trimethoprim (BACTRIM DS) 800-160 MG tablet Take 1 tablet by mouth daily. Patient taking differently: Take 1 tablet by mouth 2 (two) times daily.  12/30/18   Geradine Girt, DO  SYMBICORT 160-4.5 MCG/ACT inhaler Inhale 2 puffs into the lungs 2 (two) times daily.  11/30/18   [provider]  zolpidem (AMBIEN) 10 MG tablet Take 10 mg by mouth at bedtime as needed for sleep.    [provider]    Family History Family History  Problem Relation Age of Onset  . Heart disease Mother   . Lung cancer Mother   . Heart disease Father   . Malignant hyperthermia Father   . COPD Father   . Throat cancer Sister   . Esophageal cancer Sister   . Hypertension Other   . COPD Other   . Colon cancer Neg Hx   . Colon polyps Neg Hx   . Rectal cancer Neg Hx   . Stomach cancer Neg Hx     Social History Social History   Tobacco Use  . Smoking status: Current Every Day Smoker    Packs/day: 0.50    Years: 43.00    Pack years: 21.50    Types: Cigarettes    Start date: 08/13/1973  . Smokeless tobacco: Never Used  . Tobacco comment: i dont know i just make   Substance Use Topics  . Alcohol use: Not Currently    Frequency: Never    Comment: quit drinking in 2017  . Drug use: Not Currently    Types: Marijuana, Cocaine  Comment: reports using once every 3 months, 04-06-2019 was this      Allergies   Morphine and  related, Aspirin, Clonidine derivatives, Tramadol, and Tylenol [acetaminophen]   Review of Systems Review of Systems  All other systems reviewed and are negative.    Physical Exam Updated Vital Signs BP 127/62 (BP Location: Left Arm)   Pulse 60   Temp 98.2 F (36.8 C) (Oral)   Resp 16   Ht 5\' 10"  (1.778 m)   Wt 66.7 kg   SpO2 100%   BMI 21.09 kg/m   Physical Exam Vitals signs and nursing note reviewed.  Constitutional:      General: He is not in acute distress.    Appearance: He is well-developed.  HENT:     Head: Atraumatic.  Eyes:     Conjunctiva/sclera: Conjunctivae normal.  Neck:     Musculoskeletal: Neck supple.  Cardiovascular:     Rate and Rhythm: Normal rate and regular rhythm.     Heart sounds: Murmur present.  Pulmonary:     Effort: Pulmonary effort is normal.     Breath sounds: Normal breath sounds.  Abdominal:     General: There is distension.     Tenderness: There is no abdominal tenderness.  Musculoskeletal:        General: No swelling.  Skin:    Findings: No rash.  Neurological:     Mental Status: He is alert and oriented to person, place, and time.  Psychiatric:        Mood and Affect: Mood normal.      ED Treatments / Results  Labs (all labs ordered are listed, but only abnormal results are displayed) Labs Reviewed  CBC WITH DIFFERENTIAL/PLATELET - Abnormal; Notable for the following components:      Result Value   RBC 3.32 (*)    Hemoglobin 9.1 (*)    HCT 26.1 (*)    MCV 78.6 (*)    RDW 16.2 (*)    All other components within normal limits  BASIC METABOLIC PANEL - Abnormal; Notable for the following components:   Potassium 5.8 (*)    Chloride 95 (*)    Glucose, Bld 102 (*)    BUN 52 (*)    Creatinine, Ser 10.55 (*)    GFR calc non Af Amer 5 (*)    GFR calc Af Amer 6 (*)    All other components within normal limits  SARS CORONAVIRUS 2 (TAT 6-24 HRS)    EKG None  Radiology Dg Chest Portable 1 View  Result Date:  06/08/2019 CLINICAL DATA:  Patient with abdominal distension. EXAM: PORTABLE CHEST 1 VIEW COMPARISON:  Chest radiograph 06/07/2019 FINDINGS: Stable cardiomegaly. No large area of pulmonary consolidation. No pleural effusion or pneumothorax. Regional skeleton is unremarkable. IMPRESSION: No acute cardiopulmonary process.  Cardiomegaly. Electronically Signed   By: Lovey Newcomer M.D.   On: 06/08/2019 16:20   Dg Chest Port 1 View  Result Date: 06/07/2019 CLINICAL DATA:  Short of breath. EXAM: PORTABLE CHEST 1 VIEW COMPARISON:  06/02/2019 FINDINGS: Mild enlargement of the cardiopericardial silhouette is stable. No mediastinal or hilar masses. Prominent bronchovascular markings, stable. No evidence of pneumonia or pulmonary edema. No pleural effusion or pneumothorax. Skeletal structures are grossly intact. IMPRESSION: No acute cardiopulmonary disease. Electronically Signed   By: Lajean Manes M.D.   On: 06/07/2019 17:34    Procedures Procedures (including critical care time)  Medications Ordered in ED Medications  sodium zirconium cyclosilicate (LOKELMA) packet 10 g (has  no administration in time range)     Initial Impression / Assessment and Plan / ED Course  I have reviewed the triage vital signs and the nursing notes.  Pertinent labs & imaging results that were available during my care of the patient were reviewed by me and considered in my medical decision making (see chart for details).        BP 127/62 (BP Location: Left Arm)   Pulse 60   Temp 98.2 F (36.8 C) (Oral)   Resp 16   Ht 5\' 10"  (1.778 m)   Wt 66.7 kg   SpO2 100%   BMI 21.09 kg/m    Final Clinical Impressions(s) / ED Diagnoses   Final diagnoses:  Dialysis patient, noncompliant ([place])  Hyperkalemia    ED Discharge Orders    None     9:48 AM Patient here requesting to be dialyzed.  Last dialysis session was approximately 5 days ago.  He is overall well-appearing, no shortness of breath, lungs clear on  auscultation.  Chest x-ray from yesterday without any evidence of pleural effusion.  Potassium of 5.3 obtained yesterday.  I have consulted dialysis specialist Dr. Melvia Heaps who recommend dialyzing tomorrow. Will recheck K+ today.   11:00 AM K+ is 5.8.  I discuss finding with patient and encourage admission for dialysis tomorrow.  Pt refused to stay but agrees to f/u with his dialysis center tomorrow for dialysis.  Will give Lokelma for reduction of K+.  Pt otherwise stable for discharge.  Pt understand to return tomorrow if unable to be dialyze outpt. Care discussed with Dr. Vanita Panda.   Domenic Moras, PA-C 06/09/19 1102    Carmin Muskrat, MD 06/09/19 1359

## 2019-06-10 DIAGNOSIS — D631 Anemia in chronic kidney disease: Secondary | ICD-10-CM

## 2019-06-10 DIAGNOSIS — I120 Hypertensive chronic kidney disease with stage 5 chronic kidney disease or end stage renal disease: Secondary | ICD-10-CM | POA: Diagnosis not present

## 2019-06-10 DIAGNOSIS — E8770 Fluid overload, unspecified: Secondary | ICD-10-CM

## 2019-06-10 DIAGNOSIS — I2510 Atherosclerotic heart disease of native coronary artery without angina pectoris: Secondary | ICD-10-CM | POA: Diagnosis not present

## 2019-06-10 DIAGNOSIS — J449 Chronic obstructive pulmonary disease, unspecified: Secondary | ICD-10-CM

## 2019-06-10 DIAGNOSIS — B182 Chronic viral hepatitis C: Secondary | ICD-10-CM | POA: Diagnosis present

## 2019-06-10 DIAGNOSIS — D509 Iron deficiency anemia, unspecified: Secondary | ICD-10-CM | POA: Diagnosis not present

## 2019-06-10 DIAGNOSIS — Z9115 Patient's noncompliance with renal dialysis: Secondary | ICD-10-CM

## 2019-06-10 DIAGNOSIS — K7460 Unspecified cirrhosis of liver: Secondary | ICD-10-CM | POA: Diagnosis not present

## 2019-06-10 DIAGNOSIS — N186 End stage renal disease: Secondary | ICD-10-CM

## 2019-06-10 DIAGNOSIS — Z79899 Other long term (current) drug therapy: Secondary | ICD-10-CM

## 2019-06-10 DIAGNOSIS — Z955 Presence of coronary angioplasty implant and graft: Secondary | ICD-10-CM

## 2019-06-10 DIAGNOSIS — R188 Other ascites: Secondary | ICD-10-CM

## 2019-06-10 DIAGNOSIS — B181 Chronic viral hepatitis B without delta-agent: Secondary | ICD-10-CM | POA: Diagnosis present

## 2019-06-10 DIAGNOSIS — Z66 Do not resuscitate: Secondary | ICD-10-CM | POA: Diagnosis present

## 2019-06-10 DIAGNOSIS — Z801 Family history of malignant neoplasm of trachea, bronchus and lung: Secondary | ICD-10-CM | POA: Diagnosis not present

## 2019-06-10 DIAGNOSIS — F1410 Cocaine abuse, uncomplicated: Secondary | ICD-10-CM | POA: Diagnosis present

## 2019-06-10 DIAGNOSIS — Z800 Family history of malignant neoplasm of digestive organs: Secondary | ICD-10-CM | POA: Diagnosis not present

## 2019-06-10 DIAGNOSIS — N2581 Secondary hyperparathyroidism of renal origin: Secondary | ICD-10-CM | POA: Diagnosis not present

## 2019-06-10 DIAGNOSIS — Z20828 Contact with and (suspected) exposure to other viral communicable diseases: Secondary | ICD-10-CM | POA: Diagnosis present

## 2019-06-10 DIAGNOSIS — R140 Abdominal distension (gaseous): Secondary | ICD-10-CM | POA: Diagnosis present

## 2019-06-10 DIAGNOSIS — Z808 Family history of malignant neoplasm of other organs or systems: Secondary | ICD-10-CM | POA: Diagnosis not present

## 2019-06-10 DIAGNOSIS — Z825 Family history of asthma and other chronic lower respiratory diseases: Secondary | ICD-10-CM | POA: Diagnosis not present

## 2019-06-10 DIAGNOSIS — E875 Hyperkalemia: Secondary | ICD-10-CM | POA: Diagnosis present

## 2019-06-10 DIAGNOSIS — Z992 Dependence on renal dialysis: Secondary | ICD-10-CM | POA: Diagnosis not present

## 2019-06-10 DIAGNOSIS — I480 Paroxysmal atrial fibrillation: Secondary | ICD-10-CM

## 2019-06-10 DIAGNOSIS — F17210 Nicotine dependence, cigarettes, uncomplicated: Secondary | ICD-10-CM | POA: Diagnosis present

## 2019-06-10 DIAGNOSIS — Z87442 Personal history of urinary calculi: Secondary | ICD-10-CM | POA: Diagnosis not present

## 2019-06-10 DIAGNOSIS — R0600 Dyspnea, unspecified: Secondary | ICD-10-CM | POA: Diagnosis present

## 2019-06-10 DIAGNOSIS — K219 Gastro-esophageal reflux disease without esophagitis: Secondary | ICD-10-CM | POA: Diagnosis present

## 2019-06-10 DIAGNOSIS — F419 Anxiety disorder, unspecified: Secondary | ICD-10-CM | POA: Diagnosis present

## 2019-06-10 DIAGNOSIS — I252 Old myocardial infarction: Secondary | ICD-10-CM | POA: Diagnosis not present

## 2019-06-10 LAB — SARS CORONAVIRUS 2 (TAT 6-24 HRS): SARS Coronavirus 2: NEGATIVE

## 2019-06-10 MED ORDER — DIPHENHYDRAMINE HCL 50 MG/ML IJ SOLN
INTRAMUSCULAR | Status: AC
  Filled 2019-06-10: qty 1

## 2019-06-10 MED ORDER — HYDROXYZINE HCL 10 MG PO TABS
10.0000 mg | ORAL_TABLET | Freq: Three times a day (TID) | ORAL | Status: DC | PRN
  Administered 2019-06-10 – 2019-06-11 (×2): 10 mg via ORAL
  Filled 2019-06-10 (×4): qty 1

## 2019-06-10 MED ORDER — HYDROMORPHONE HCL 1 MG/ML IJ SOLN
0.5000 mg | Freq: Once | INTRAMUSCULAR | Status: AC | PRN
  Administered 2019-06-10: 0.5 mg via INTRAVENOUS
  Filled 2019-06-10: qty 1

## 2019-06-10 MED ORDER — HYDROMORPHONE HCL 1 MG/ML IJ SOLN: 0.5000 mg | Freq: Once | INTRAMUSCULAR | Status: DC

## 2019-06-10 MED ORDER — DIPHENHYDRAMINE HCL 50 MG/ML IJ SOLN
25.0000 mg | Freq: Once | INTRAMUSCULAR | Status: AC
  Administered 2019-06-10: 25 mg via INTRAVENOUS

## 2019-06-10 NOTE — Progress Notes (Signed)
HD tx terminated with 50 mins remaining as ordered. EArly termination against medical advice signed. On call  Nephrologist made aware. Pt alert/oriented in no acute distress No complaints voiced.. VSS. Report given to primary nurse.

## 2019-06-10 NOTE — Progress Notes (Signed)
[facility] Progress Note   Subjective:   Seen in room. HD delayed this AM due to water connectivity issues in the room, will have to wait until HD unit is finished being sanitized. Pt reports, "I'm okay." Denies SOB, cough, CP, palpitations, abdominal pain, N/V/D.  Objective Vitals:   06/09/19 1845 06/09/19 2130 06/10/19 0451 06/10/19 0916  BP: (!) 159/70 (!) 164/60 (!) 175/75 (!) 157/68  Pulse:  64 68 70  Resp: 18 20  18   Temp:  98 F (36.7 C) 98.7 F (37.1 C) 98.2 F (36.8 C)  TempSrc:  Oral Oral Oral  SpO2:  99% 91% 95%  Weight:  68.2 kg    Height:  5\' 10"  (1.778 m)     Physical Exam General: Well developed, well nourished male. Alert and in NAD Heart: RRR, no murmurs, rubs or gallops Lungs: CTA without wheezing, rhonchi or rales. Breathing unlabored on RA Abdomen:Distended but soft, non-tender with normoactive bowel sounds. No rebound/guarding. No obvious abdominal masses. Extremities:No edema b/l lower extremities Dialysis Access:  RUE AVF + bruit  Additional Objective Labs: Basic Metabolic Panel: Recent Labs  Lab 06/08/19 1601 06/09/19 1008 06/09/19 1800  NA 139 135 136  K 5.3* 5.8* 5.8*  CL 99 95* 96*  CO2 25 25 24   GLUCOSE 86 102* 116*  BUN 44* 52* 57*  CREATININE 9.62* 10.55* 10.57*  CALCIUM 9.8 10.0 10.1   Liver Function Tests: Recent Labs  Lab 06/07/19 1658 06/08/19 1601  AST 37 35  ALT 29 27  ALKPHOS 129* 126  BILITOT 1.0 1.0  PROT 6.6 6.2*  ALBUMIN 3.3* 3.4*   Recent Labs  Lab 06/07/19 1658 06/08/19 1601  LIPASE 35 29   CBC: Recent Labs  Lab 06/07/19 1658 06/08/19 1601 06/09/19 1008 06/09/19 1800  WBC 9.4 9.1 8.9 9.0  NEUTROABS 6.8 6.3 6.6  --   HGB 9.5* 8.8* 9.1* 8.7*  HCT 26.8* 25.2* 26.1* 24.4*  MCV 78.1* 78.0* 78.6* 77.7*  PLT 193 179 171 167   Blood Culture    Component Value Date/Time   SDES ABDOMEN FLUID 05/14/2019 0659   SPECREQUEST NONE 05/14/2019 0659   CULT  05/14/2019 0659    NO GROWTH 3  DAYS Performed at [facility], [place] [address], [place],  [place] [address]    REPTSTATUS 05/17/2019 FINAL 05/14/2019 3893     Studies/Results: Dg Chest Portable 1 View  Result Date: 06/08/2019 CLINICAL DATA:  Patient with abdominal distension. EXAM: PORTABLE CHEST 1 VIEW COMPARISON:  Chest radiograph 06/07/2019 FINDINGS: Stable cardiomegaly. No large area of pulmonary consolidation. No pleural effusion or pneumothorax. Regional skeleton is unremarkable. IMPRESSION: No acute cardiopulmonary process.  Cardiomegaly. Electronically Signed   By: Lovey Newcomer M.D.   On: 06/08/2019 16:20   Medications:  . Chlorhexidine Gluconate Cloth  6 each Topical Q0600  . [START ON 06/16/2019] darbepoetin (ARANESP) injection - DIALYSIS  40 mcg Intravenous Q Sat-HD  . heparin  5,000 Units Subcutaneous Q8H  . hydrALAZINE  50 mg Oral TID  . lactulose  30 g Oral BID  . metoprolol tartrate  100 mg Oral BID  . mometasone-formoterol  2 puff Inhalation BID  . montelukast  10 mg Oral QPM  . pantoprazole  40 mg Oral Daily  . sevelamer carbonate  1,600 mg Oral With snacks  . sevelamer carbonate  2,400 mg Oral TID WC  . sodium zirconium cyclosilicate  10 g Oral TID  . sulfamethoxazole-trimethoprim  1 tablet Oral Daily  Dialysis Orders: Center: [facility]  on MWF. Time: 4 hours, 180NRe, BFR 450, DFR 800, EDW 65kg, 2K/2Ca - last HD 11/24 (usually runs about 2.5 hours)  Mircera 50 mcg IV q 2 weeks- last dose 75 mcg on 04/27/19 Parsabiv 2.5 mg IV q HD Calcitriol 2.5 mcg PO q HD Renvela 4 tabs PO TID with meals and 2/snack No heparin   Assessment/Plan: 1. ESRD: MWF schedule, last HD was 11/24. Unable to dialyze yet due to high inpatient census and water connectivity issues this AM. Will need to wait until HD unit is sanitized to dialyze in the unit. K+ 5.8, on lokelma TID for stabilization. Will hopefully be able to discharge after HD unit and resume MWF schedule at his outpatient  unit tomorrow.  2. Recurrent ascites: Last paracentesis 06/07/19 with 2.8L removed. Further volume management with HD. 3. Hypertension: BP elevated. Continue home meds. 2kg above EDW, UF with HD tolerated. 4. Anemia: Hemoglobin 8.7. Mircera last dosed 10/16, will order aranesp with HD.  5. Metabolic bone disease: Corrected calcium high. Use low Ca bath with HD. Hold calcitriol. Parsabiv not on formulary in the hospital- will resume at discharge. Continue renvela.  6. Nutrition:  Renal diet with fluid restrictions.  Anice Paganini, PA-C 06/10/2019, 12:49 PM  [facility] Pager: [phone]

## 2019-06-10 NOTE — Progress Notes (Signed)
HD unit in system disinfect, attempt to start pt. HD at bedside with no success due to low water pressure in room facilities called to address water pressure issue unable to resolve issue at this time. Dr. Jonnie Finner paged and made aware, ok to hold off on HD until able to bring up to HD unit. Pt. Stable

## 2019-06-10 NOTE — Progress Notes (Signed)
Internal Medicine Teaching Service Attending:   I saw and examined the patient. I reviewed the resident's note and I agree with the resident's findings and plan as documented in the resident's note.  Principal Problem:   Dyspnea Active Problems:   Hyperkalemia   ESRD on dialysis ([place])   Liver cirrhosis ([place])   A-fib [facility])  Hospital day #1 for this 58 year old person living with end-stage renal disease on hemodialysis and chronic liver disease complicated by ascites admitted with volume overload and dyspnea due to missed HD sessions.  Patient is clinically stable today but still in need of hemodialysis.  Hyperkalemia has been medically treated with Lokelma.  Greatly appreciate nephrology consultation and they are planning on inpatient hemodialysis later today.  Will reassess the patient after HD session and consider if he needs further HD or will be eligible for discharge to home.  Lalla Brothers, MD FACP

## 2019-06-10 NOTE — Progress Notes (Signed)
   Subjective:  Patient is resting in bed. Patient reports he is itching. In addition he is very uncomfortable due to the fluid on his abdomen. Patient made aware HD is planned for this afternoon due to not being able to conduct HD. Denies SOB.   Objective:  Vital signs in last 24 hours: Vitals:   06/09/19 1751 06/09/19 1845 06/09/19 2130 06/10/19 0451  BP:  (!) 159/70 (!) 164/60 (!) 175/75  Pulse: 65  64 68  Resp: 18 18 20    Temp:   98 F (36.7 C) 98.7 F (37.1 C)  TempSrc:   Oral Oral  SpO2: 98%  99% 91%  Weight:   68.2 kg   Height:   5\' 10"  (1.778 m)    Gen: appears uncomfortable, chronically ill appearing. Uncooperative , but redirected and allowed for physical exam CV:  Regular rate and rhythm, no murmurs rub or gallops Pulm: CTA anteriorly Abd: Distended with mild diffuse tenderness , normal bowel sounds  Assessment/Plan:  Principal Problem:   Dyspnea Active Problems:   Hyperkalemia   ESRD on dialysis ([place])   Abdominal distention   A-fib ([place])  [name] is a 58 year old male with past medical history of end-stage renal disease on HD MWF, recurrent ascites due to decompensated cirrhosis, history of SBP, paroxysmal A. fib, COPD, chronic worsening anemia due to renal disease, and coronary artery disease status post stent placement 2018 admitted for SOB after missing dialysis.   #ESRD on HD MWF Plans to dialyze this afternoon, approximately 4 p.m. Plan to discharge first thing in the morning pending no changes.  - HD today - Appreciate nephrology managing HD - Telemetry  #Anemia of chronic disease - Hgb stable on admission  #HTN Continue home hydralazine, metoprolol tartrate  #Atrial Fibrillation -Continue  metoprolol tartrate as above  #COPD - continue home inhalers  Dispo: Anticipated discharge in approximately 0-1.   Tamsen Snider, MD PGY1  [phone]

## 2019-06-11 DIAGNOSIS — Z992 Dependence on renal dialysis: Secondary | ICD-10-CM | POA: Diagnosis not present

## 2019-06-11 DIAGNOSIS — D631 Anemia in chronic kidney disease: Secondary | ICD-10-CM | POA: Diagnosis not present

## 2019-06-11 DIAGNOSIS — N186 End stage renal disease: Secondary | ICD-10-CM | POA: Diagnosis not present

## 2019-06-11 DIAGNOSIS — D509 Iron deficiency anemia, unspecified: Secondary | ICD-10-CM | POA: Diagnosis not present

## 2019-06-11 DIAGNOSIS — N2581 Secondary hyperparathyroidism of renal origin: Secondary | ICD-10-CM | POA: Diagnosis not present

## 2019-06-11 MED ORDER — DIPHENHYDRAMINE HCL 25 MG PO CAPS
25.0000 mg | ORAL_CAPSULE | Freq: Once | ORAL | Status: AC | PRN
  Administered 2019-06-11: 25 mg via ORAL
  Filled 2019-06-11: qty 1

## 2019-06-11 MED ORDER — CAMPHOR-MENTHOL 0.5-0.5 % EX LOTN
TOPICAL_LOTION | CUTANEOUS | Status: DC | PRN
  Filled 2019-06-11: qty 222

## 2019-06-11 MED ORDER — VITAMIN E 180 MG (400 UNIT) PO CAPS
400.0000 [IU] | ORAL_CAPSULE | Freq: Every day | ORAL | Status: DC
  Filled 2019-06-11: qty 1

## 2019-06-11 NOTE — Discharge Instructions (Signed)
You were admitted for shortness of breath after missing dialysis. Please attend your scheduled Monday, Wednesday, Friday dialysis sessions as scheduled. It is important not to miss any. Please have your scheduled paracentesis tomorrow. Please follow up with your primary care doctor within one week.

## 2019-06-11 NOTE — Progress Notes (Signed)
DISCHARGE NOTE HOME [name] to be discharged Home per MD order. Discussed prescriptions and follow up appointments with the patient. Prescriptions given to patient; medication list explained in detail. Patient verbalized understanding.  Skin clean, dry and intact without evidence of skin break down, no evidence of skin tears noted. IV catheter discontinued intact. Site without signs and symptoms of complications. Dressing and pressure applied. Pt denies pain at the site currently. No complaints noted.  Patient free of lines, drains, and wounds.   An After Visit Summary (AVS) was printed and given to the patient. Patient escorted via wheelchair, and discharged home via private auto.  Paulla Fore, RN

## 2019-06-11 NOTE — Progress Notes (Signed)
   Subjective: Mr. Hehl reports some abdominal cramping this morning.  He denies diarrhea or constipation.  He has had 2 bowel movements this morning.  He has no other complaints or concerns.   Consults: Nephrology  Objective:  Vital signs in last 24 hours: Vitals:   06/10/19 2150 06/10/19 2252 06/11/19 0557 06/11/19 0922  BP: 122/72 (!) 164/99 (!) 153/106 (!) 177/95  Pulse: (!) 101 79 70 70  Resp: 18 18 16 18   Temp: 98 F (36.7 C) 98.6 F (37 C) 98.8 F (37.1 C) 98.7 F (37.1 C)  TempSrc: Oral Oral Oral Oral  SpO2: 95% 93% 93% 97%  Weight: 68.1 kg  68 kg   Height:        Physical Exam  Constitutional: He is oriented to person, place, and time. No distress.  Chronically ill-appearing male; thin with bloated abdomen  Neck: Normal range of motion.  Cardiovascular: Normal rate and regular rhythm.  Pulmonary/Chest: Effort normal and breath sounds normal. No respiratory distress.  Abdominal: Bowel sounds are normal. He exhibits distension. There is no abdominal tenderness.  Musculoskeletal: Normal range of motion.        General: No edema.  Neurological: He is alert and oriented to person, place, and time.  Skin: Skin is warm and dry. He is not diaphoretic.  Nursing note and vitals reviewed.   I/Os:  Intake/Output Summary (Last 24 hours) at 06/11/2019 1045 Last data filed at 06/11/2019 0900 Gross per 24 hour  Intake 780 ml  Output 1700 ml  Net -920 ml   Labs: BMP Latest Ref Rng & Units 06/09/2019 06/09/2019 06/08/2019  Glucose 70 - 99 mg/dL 116(H) 102(H) 86  BUN 6 - 20 mg/dL 57(H) 52(H) 44(H)  Creatinine 0.61 - 1.24 mg/dL 10.57(H) 10.55(H) 9.62(H)  Sodium 135 - 145 mmol/L 136 135 139  Potassium 3.5 - 5.1 mmol/L 5.8(H) 5.8(H) 5.3(H)  Chloride 98 - 111 mmol/L 96(L) 95(L) 99  CO2 22 - 32 mmol/L 24 25 25   Calcium 8.9 - 10.3 mg/dL 10.1 10.0 9.8   CBC Latest Ref Rng & Units 06/09/2019 06/09/2019 06/08/2019  WBC 4.0 - 10.5 K/uL 9.0 8.9 9.1  Hemoglobin 13.0 - 17.0  g/dL 8.7(L) 9.1(L) 8.8(L)  Hematocrit 39.0 - 52.0 % 24.4(L) 26.1(L) 25.2(L)  Platelets 150 - 400 K/uL 167 171 179    Assessment/Plan:  Assessment: Mr. Iovino is a 58 year old male with a past medical history of ESRD on HD MWF, recurrent ascites due to decompensated cirrhosis with a history of SBP, paroxysmal A. fib, COPD, chronic anemia due to renal disease and CAD status post stent placement admitted for SOB after missing dialysis.  Plan: Principal Problem:   Dyspnea Active Problems:   Hyperkalemia   ESRD on dialysis ([place]) -Dialyzed yesterday although he did end his session 50 minutes early -No shortness of breath today -Plan for discharge with regular outpatient HD on MWF    Abdominal cramping -Patient complains of abdominal cramping  -Denies diarrhea or constipation; he has had 2 bowel movements today -Treated with 1 dose of vitamin D -Patient reports he has a scheduled outpatient paracentesis tomorrow      Anemia of renal disease -Hemoglobin stable    A-fib ([facility]) -Continue metoprolol tartrate    Hypertension -Continue home hydralazine and metoprolol tartrate    COPD -Continue home inhalers  Dispo: Anticipated discharge today.  Al Decant, MD 06/11/2019, 10:45 AM Pager: [phone]

## 2019-06-11 NOTE — Discharge Summary (Addendum)
Name: [name] MRN: [id number] DOB: February 24, 1961 58 y.o. PCP: [name], FNP  Date of Admission: 06/09/2019  2:52 PM Date of Discharge: 06/11/2019  Attending Physician: No att. providers found  Discharge Diagnosis:  1. Shortness of breath 2. Volume overload 3. ESRD on HD with noncompliance 4. Cirrhosis with ascites.  Discharge Medications: Allergies as of 06/11/2019       Reactions   Morphine And Related Other (See Comments)   Stomach pain   Aspirin Other (See Comments)   STOMACH PAIN   Clonidine Derivatives Itching   Tramadol Itching   Tylenol [acetaminophen] Nausea Only   Stomach ache        Medication List     TAKE these medications    hydrALAZINE 100 MG tablet Commonly known as: APRESOLINE Take 0.5 tablets (50 mg total) by mouth 3 (three) times daily. What changed: how much to take   lactulose 10 GM/15ML solution Commonly known as: CHRONULAC TAKE 45 MLS BY MOUTH 2 TIMES DAILY. What changed:  how much to take how to take this when to take this additional instructions   loperamide 2 MG tablet Commonly known as: IMODIUM A-D Take 2 mg by mouth 4 (four) times daily as needed for diarrhea or loose stools.   metoprolol tartrate 100 MG tablet Commonly known as: LOPRESSOR Take 1 tablet (100 mg total) by mouth 2 (two) times daily.   montelukast 10 MG tablet Commonly known as: SINGULAIR Take 10 mg by mouth every evening.   ondansetron 4 MG tablet Commonly known as: Zofran Take 1 tablet (4 mg total) by mouth daily as needed for up to 10 days for nausea or vomiting. What changed: when to take this   oxyCODONE 5 MG immediate release tablet Commonly known as: Oxy IR/ROXICODONE Take 5 mg by mouth 2 (two) times daily as needed for moderate pain or severe pain.   pantoprazole 40 MG tablet Commonly known as: PROTONIX Take 1 tablet (40 mg total) by mouth daily. What changed:  when to take this additional instructions   sevelamer  carbonate 800 MG tablet Commonly known as: RENVELA Take 4 tablets with meals  And 2 tablets twice daily with snacks What changed:  how much to take how to take this when to take this additional instructions   sulfamethoxazole-trimethoprim 800-160 MG tablet Commonly known as: BACTRIM DS Take 1 tablet by mouth daily. What changed: when to take this   Symbicort 160-4.5 MCG/ACT inhaler Generic drug: budesonide-formoterol Inhale 2 puffs into the lungs 2 (two) times daily.   Ventolin HFA 108 (90 Base) MCG/ACT inhaler Generic drug: albuterol Inhale 2 puffs into the lungs every 6 (six) hours as needed for wheezing or shortness of breath.   zolpidem 10 MG tablet Commonly known as: AMBIEN Take 10 mg by mouth at bedtime as needed for sleep.        Disposition and follow-up:   JoshuaJoshua Diaz was discharged from [facility] in Stable condition.  At the hospital follow up visit please address:   1.  Adherence to outpatient dialysis schedule.  2.  Labs / imaging needed at time of follow-up: na  3.  Pending labs/ test needing follow-up: na  Follow-up Appointments: Follow-up Information     [name], FNP Follow up.   Specialty: Family Medicine Contact information: [place] [address] [place] [facility] Course by problem list:  1.  Shortness of breath 2/2 Volume overload from ESRD on HD with noncompliance with outpatient HD. -[name] is a 58 year old male with a past medical history of ESRD on HD MWF, recurrent ascites due to decompensated cirrhosis with a history of SBP, paroxysmal A. fib, COPD, chronic anemia due to renal disease and CAD status post stent placement admitted for SOB after missing dialysis who had resolution of his symptoms with an in-hospital dialysis session.  Discharge Vitals:   BP (!) 177/95 (BP Location: Left Arm)   Pulse 70   Temp 98.7 F (37.1 C) (Oral)   Resp 18   Ht 5\' 10"   (1.778 m)   Wt 68 kg   SpO2 97%   BMI 21.51 kg/m   Pertinent Labs, Studies, and Procedures:  BMP Latest Ref Rng & Units 06/09/2019 06/09/2019 06/08/2019  Glucose 70 - 99 mg/dL 116(H) 102(H) 86  BUN 6 - 20 mg/dL 57(H) 52(H) 44(H)  Creatinine 0.61 - 1.24 mg/dL 10.57(H) 10.55(H) 9.62(H)  Sodium 135 - 145 mmol/L 136 135 139  Potassium 3.5 - 5.1 mmol/L 5.8(H) 5.8(H) 5.3(H)  Chloride 98 - 111 mmol/L 96(L) 95(L) 99  CO2 22 - 32 mmol/L 24 25 25   Calcium 8.9 - 10.3 mg/dL 10.1 10.0 9.8     Discharge Instructions: Discharge Instructions     Diet - low sodium heart healthy   Complete by: As directed    Increase activity slowly   Complete by: As directed        Signed: Al Decant, MD 06/12/2019, 12:36 PM   Pager: [phone]

## 2019-06-11 NOTE — Progress Notes (Signed)
Pt just removed IV and took off tele. He states that he is going home tomorrow and that he is itching despite Atarax and Benadryl given and that he just don't want anything on his skin, so he can get relief. RN educated pt on importance of having IV and tele, however pt still refused. RN informed MD on call to make aware.   Eleanora Neighbor, RN

## 2019-06-11 NOTE — Progress Notes (Signed)
Internal Medicine Clinic Attending  I saw and evaluated the patient.  I personally confirmed the key portions of the history and exam documented by Dr. Winters and I reviewed pertinent patient test results.  The assessment, diagnosis, and plan were formulated together and I agree with the documentation in the resident's note.  

## 2019-06-11 NOTE — Progress Notes (Signed)
[facility] Progress Note   Subjective:  Seen in room - denies CP or dyspnea for now. C/o mild R abd pain - says this is recurrent.    Objective Vitals:   06/10/19 2150 06/10/19 2252 06/11/19 0557 06/11/19 0922  BP: 122/72 (!) 164/99 (!) 153/106 (!) 177/95  Pulse: (!) 101 79 70 70  Resp: 18 18 16 18   Temp: 98 F (36.7 C) 98.6 F (37 C) 98.8 F (37.1 C) 98.7 F (37.1 C)  TempSrc: Oral Oral Oral Oral  SpO2: 95% 93% 93% 97%  Weight: 68.1 kg  68 kg   Height:       Physical Exam General: NAD, + facial edema Heart: RRR; 2/6 murmur Lungs: CTA in upper lobes, rales in RLL Abdomen: mild distention, but soft Extremities: No LE edema Dialysis Access: RUE AVG + bruit  Additional Objective Labs: Basic Metabolic Panel: Recent Labs  Lab 06/08/19 1601 06/09/19 1008 06/09/19 1800  NA 139 135 136  K 5.3* 5.8* 5.8*  CL 99 95* 96*  CO2 25 25 24   GLUCOSE 86 102* 116*  BUN 44* 52* 57*  CREATININE 9.62* 10.55* 10.57*  CALCIUM 9.8 10.0 10.1   Liver Function Tests: Recent Labs  Lab 06/07/19 1658 06/08/19 1601  AST 37 35  ALT 29 27  ALKPHOS 129* 126  BILITOT 1.0 1.0  PROT 6.6 6.2*  ALBUMIN 3.3* 3.4*   Recent Labs  Lab 06/07/19 1658 06/08/19 1601  LIPASE 35 29   CBC: Recent Labs  Lab 06/07/19 1658 06/08/19 1601 06/09/19 1008 06/09/19 1800  WBC 9.4 9.1 8.9 9.0  NEUTROABS 6.8 6.3 6.6  --   HGB 9.5* 8.8* 9.1* 8.7*  HCT 26.8* 25.2* 26.1* 24.4*  MCV 78.1* 78.0* 78.6* 77.7*  PLT 193 179 171 167   Blood Culture    Component Value Date/Time   SDES ABDOMEN FLUID 05/14/2019 0659   SPECREQUEST NONE 05/14/2019 0659   CULT  05/14/2019 0659    NO GROWTH 3 DAYS Performed at [facility], [place] [address], [place], [place] [address]    REPTSTATUS 05/17/2019 FINAL 05/14/2019 0659   Medications:  . Chlorhexidine Gluconate Cloth  6 each Topical Q0600  . [START ON 06/16/2019] darbepoetin (ARANESP) injection - DIALYSIS  40 mcg Intravenous Q Sat-HD  .  heparin  5,000 Units Subcutaneous Q8H  . hydrALAZINE  50 mg Oral TID  . lactulose  30 g Oral BID  . metoprolol tartrate  100 mg Oral BID  . mometasone-formoterol  2 puff Inhalation BID  . montelukast  10 mg Oral QPM  . pantoprazole  40 mg Oral Daily  . sevelamer carbonate  1,600 mg Oral With snacks  . sevelamer carbonate  2,400 mg Oral TID WC  . sulfamethoxazole-trimethoprim  1 tablet Oral Daily  . vitamin E  400 Units Oral Daily    [facility], 180NRe, 450/800, EDW 65kg, 2K/2Ca - last HD 11/24 (usually runs about 2.5 hours), no heparin - Mircera 50 mcg IV q 2 weeks- lastdose 75 mcg on 04/27/19 - Parsabiv 2.5 mg IV q HD - Calcitriol 2.5 mcg PO q HD - Binders: Renvela 4 tabs PO TID with meals and 2/snack  Assessment/Plan: 1. ESRD: S/p 3hr HD on 11/29 - would love to be able to get him out this morning to go to outpt HD session - HD coordinator working on this and will d/w primary team (he did miss his usual time, trying to  set up for 2nd shift outpt). If felt unable to discharge today, we can run him here but will be later this afternoon. 2. Recurrent ascites: Last paracentesis 06/07/19 with 2.8L removed. Further volume management with HD. 3. Hypertension: BP elevated. Continue home meds. 3kg above EDW, UF with HD tolerated. 4. Anemia:Hemoglobin low - needs Aranesp today with HD. 5. Metabolic bone disease:Corrected calcium high. Use low Ca bath with HD. Hold calcitriol. Parsabiv not on formulary in the hospital - will resume at discharge. Continue renvela. 6. Nutrition:Renal diet with fluid restrictions.  Veneta Penton, PA-C 06/11/2019, 9:59 AM  [employer] Pager: [phone]

## 2019-06-12 ENCOUNTER — Ambulatory Visit (HOSPITAL_COMMUNITY)
Admission: RE | Admit: 2019-06-12 | Discharge: 2019-06-12 | Disposition: A | Discharge status: Home / Self Care | Payer: Medicare Other | Source: Ambulatory Visit | Attending: Internal Medicine | Admitting: Internal Medicine

## 2019-06-12 ENCOUNTER — Encounter (HOSPITAL_COMMUNITY): Payer: Self-pay | Admitting: Physician Assistant

## 2019-06-12 ENCOUNTER — Other Ambulatory Visit: Payer: Self-pay

## 2019-06-12 DIAGNOSIS — R188 Other ascites: Secondary | ICD-10-CM | POA: Insufficient documentation

## 2019-06-12 DIAGNOSIS — K7460 Unspecified cirrhosis of liver: Secondary | ICD-10-CM | POA: Insufficient documentation

## 2019-06-12 HISTORY — PX: IR PARACENTESIS: IMG2679

## 2019-06-12 LAB — BODY FLUID CELL COUNT WITH DIFFERENTIAL
Lymphs, Fluid: 16 %
Monocyte-Macrophage-Serous Fluid: 83 % (ref 50–90)
Neutrophil Count, Fluid: 1 % (ref 0–25)
Total Nucleated Cell Count, Fluid: 119 cu mm (ref 0–1000)

## 2019-06-12 MED ORDER — LIDOCAINE HCL 1 % IJ SOLN
INTRAMUSCULAR | Status: AC
  Filled 2019-06-12: qty 20

## 2019-06-12 MED ORDER — LIDOCAINE HCL (PF) 1 % IJ SOLN
INTRAMUSCULAR | Status: DC | PRN
  Administered 2019-06-12: 10 mL

## 2019-06-12 NOTE — Procedures (Signed)
Ultrasound-guided diagnostic and therapeutic paracentesis performed yielding 2.5 liters of serosanguinous colored fluid.  Fluid was sent to lab for analysis. No immediate complications. EBL is none.

## 2019-06-12 NOTE — Progress Notes (Signed)
Per Nephrology, patient is cleared for discharge. Navigator called [facility] clinic[facility] who state they can accommodate patient today if he arrives by noon. Renal Navigator spoke with Primary team who states he is cleared for discharge pending paracentesis. Renal Navigator spoke with patient who states he has OP paracentesis scheduled for tomorrow. Therefore, primary states he can be discharged to OP HD clinic. Navigator spoke with patient regarding going to his OP HD clinic today for treatment after discharge. Patient states he is willing to go. He reports that he will drive himself as his car is in the parking lot here at the hospital. He states promise to go to his OP HD clinic for treatment today.   Alphonzo Cruise, [place]  Renal Navigator [phone]

## 2019-06-14 DIAGNOSIS — N186 End stage renal disease: Secondary | ICD-10-CM | POA: Diagnosis not present

## 2019-06-14 LAB — PATHOLOGIST SMEAR REVIEW: Path Review: 12022020

## 2019-06-19 DIAGNOSIS — Z8619 Personal history of other infectious and parasitic diseases: Secondary | ICD-10-CM | POA: Diagnosis not present

## 2019-06-19 DIAGNOSIS — K7460 Unspecified cirrhosis of liver: Secondary | ICD-10-CM | POA: Diagnosis not present

## 2019-06-19 DIAGNOSIS — Z79899 Other long term (current) drug therapy: Secondary | ICD-10-CM | POA: Diagnosis not present

## 2019-06-20 ENCOUNTER — Ambulatory Visit: Payer: Medicare Other | Admitting: Cardiology

## 2019-06-20 IMAGING — DX DG CHEST 2V
2 series · 2 of 2 positions shown · non-contrast
Comparison: Chest radiograph 12/07/2017

CLINICAL DATA: Shortness of breath.

EXAM:
CHEST - 2 VIEW

[chest pa]
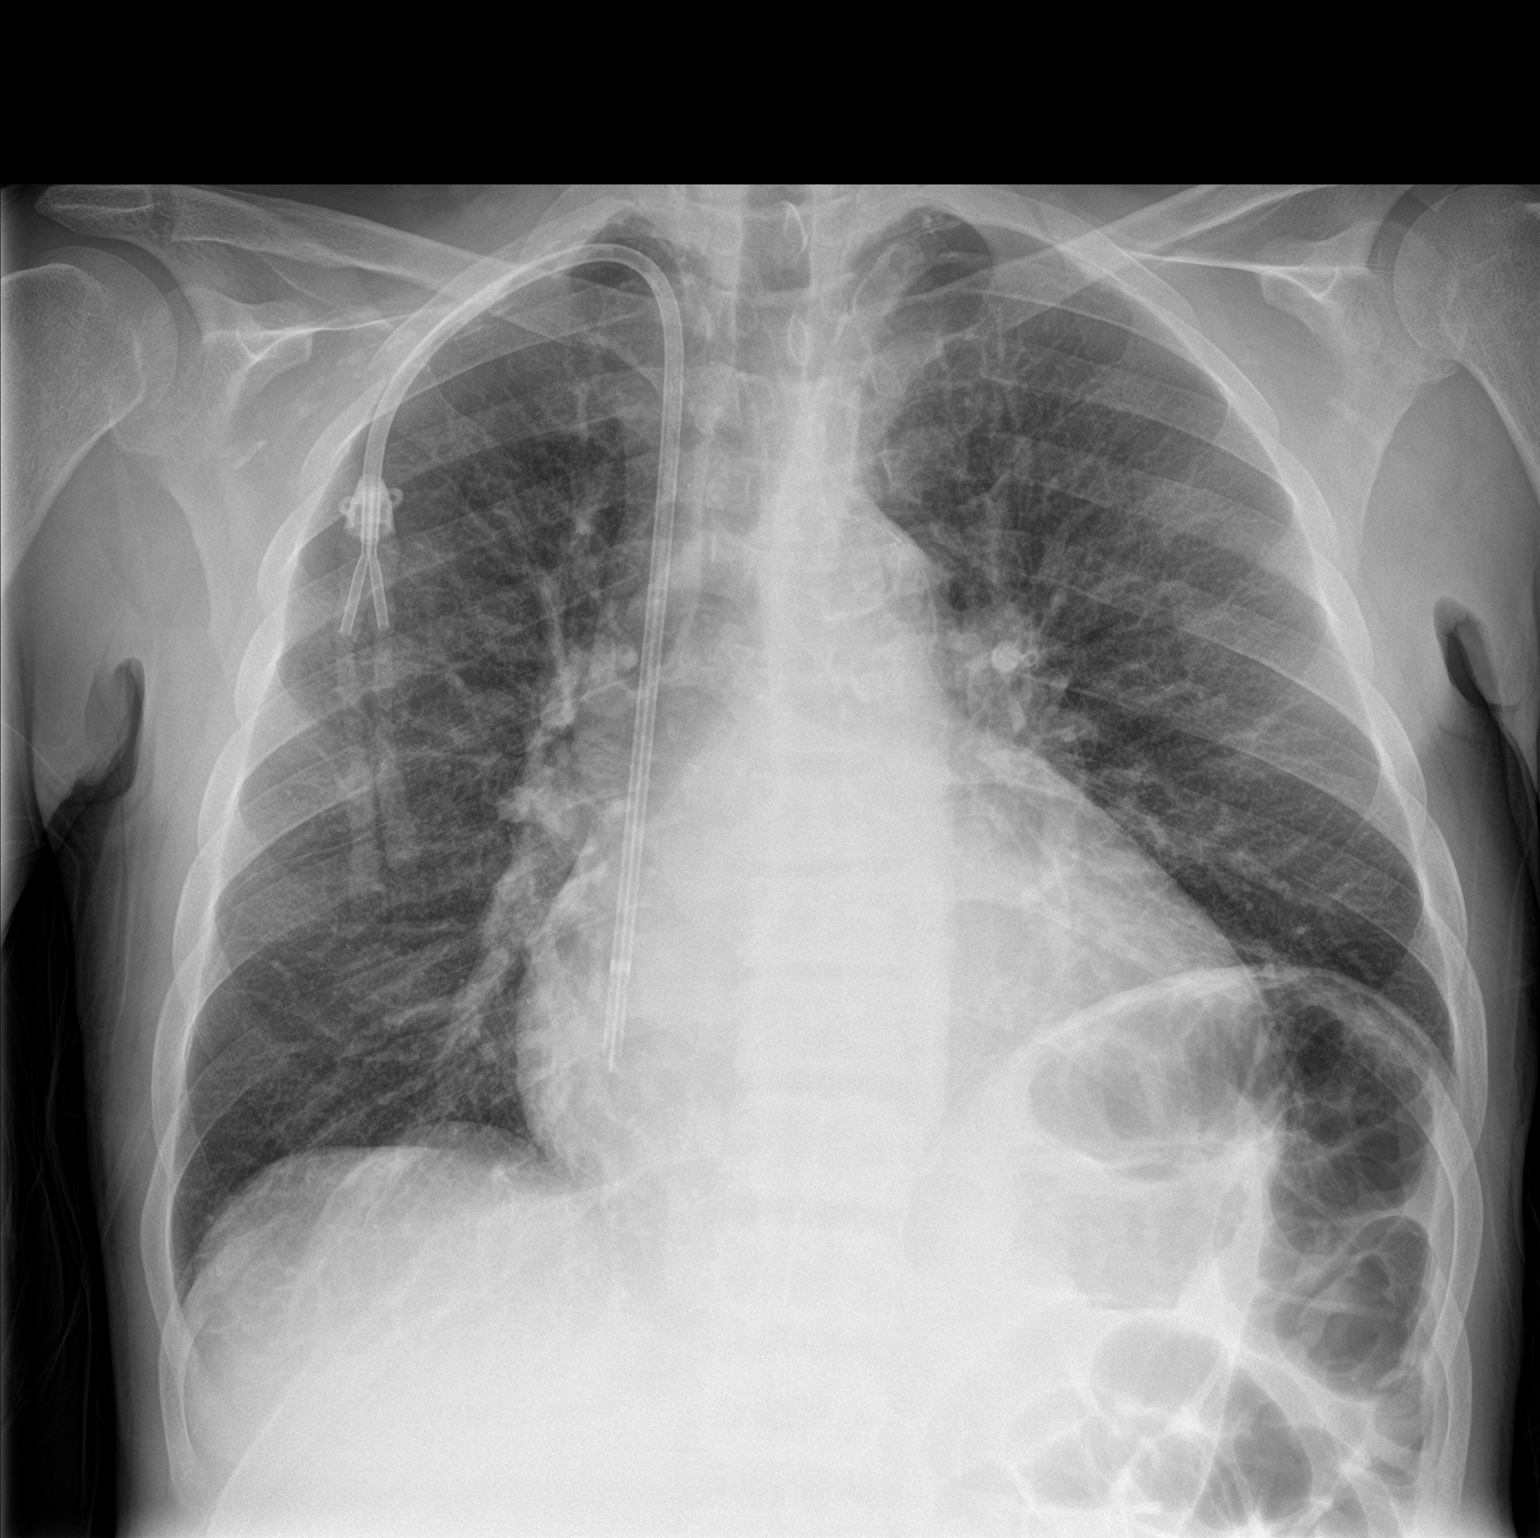

[chest lat]
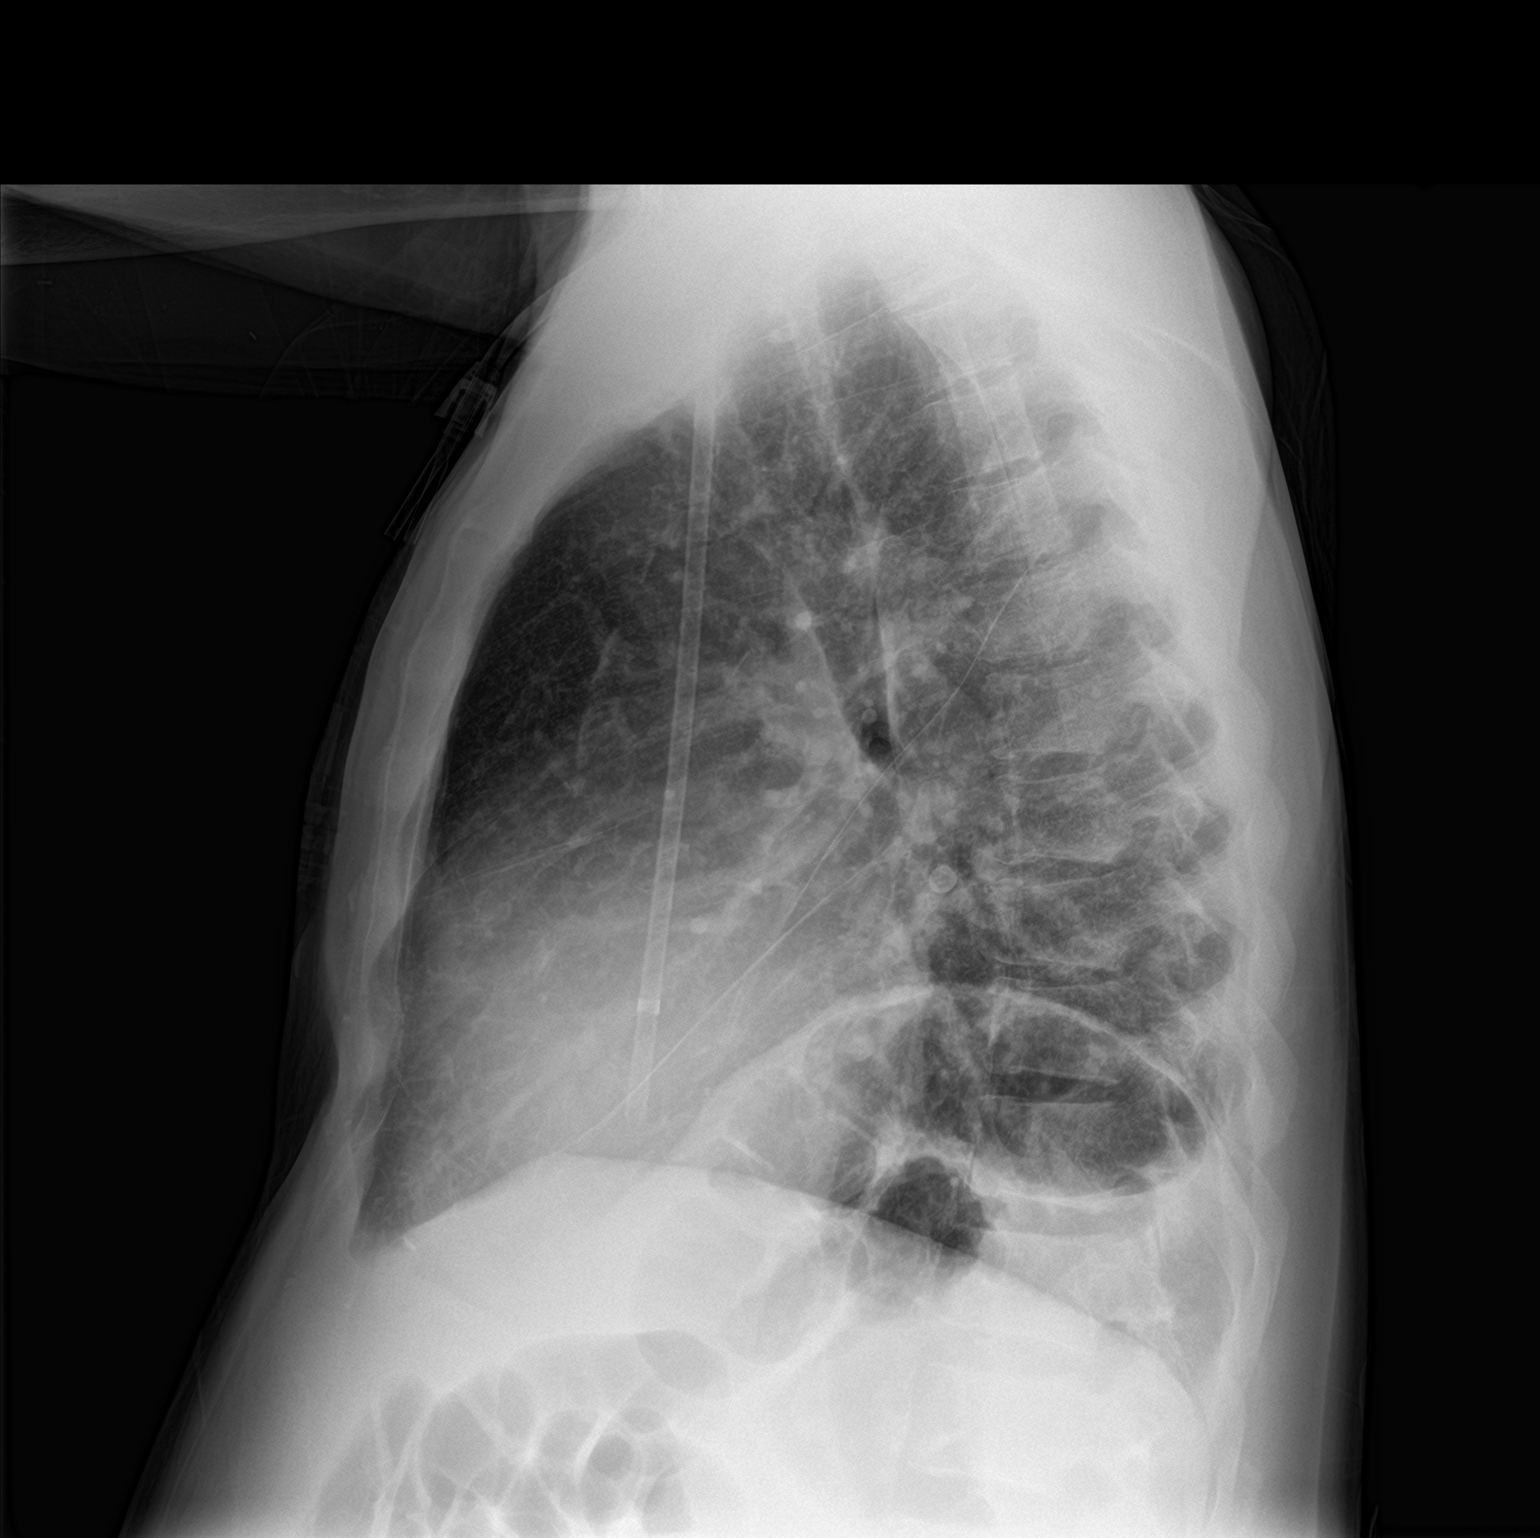

[2 of 2 positions shown; findings below may reference images not displayed]

FINDINGS: Right-sided dialysis catheter tip in the atrial caval junction.
Unchanged cardiomegaly. New pulmonary edema from prior exam. No
pleural fluid. Improved left basilar aeration from prior exam with
decreased atelectasis. No focal airspace disease. No pneumothorax.
No acute osseous abnormalities.
IMPRESSION: New pulmonary edema from prior exam.  Chronic cardiomegaly.

## 2019-06-20 IMAGING — US IR ABDOMEN US LIMITED
1 series · 2 of 2 positions shown · non-contrast
Comparison: 12/06/2017 and CT of the abdomen on 12/07/2017

CLINICAL DATA: History of ascites with prior paracentesis on
12/06/2017.

EXAM:
LIMITED ABDOMEN ULTRASOUND FOR ASCITES
TECHNIQUE: Limited ultrasound survey for ascites was performed in all four
abdominal quadrants.

[Series 1: ir (id) (id)/(id)/(id) ir · 2 of 2 slices shown]
[im 1/2]
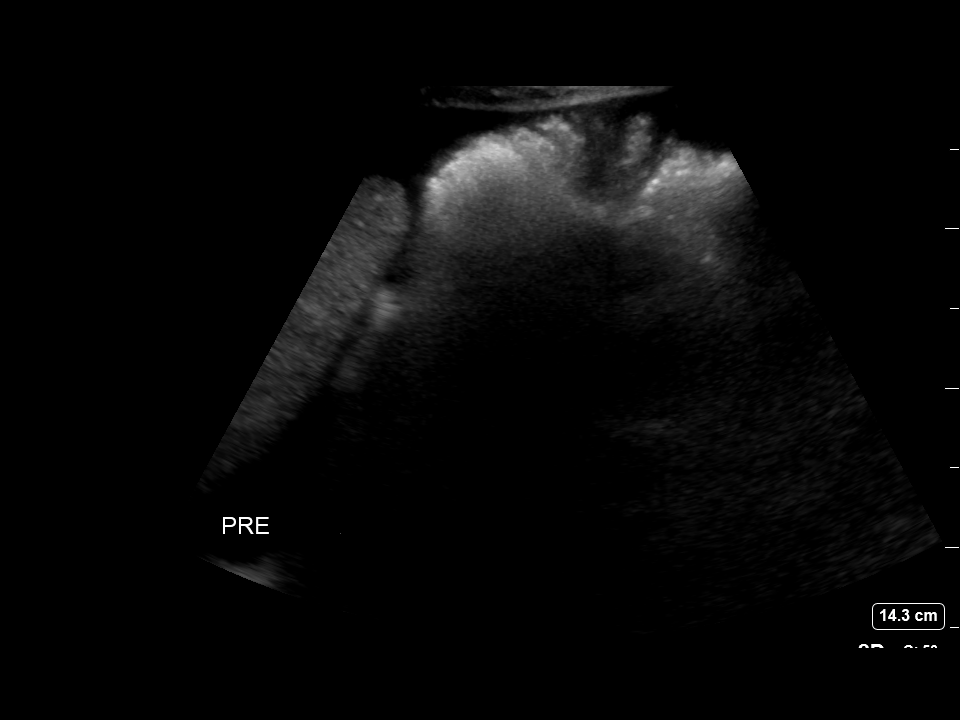
[im 2/2]
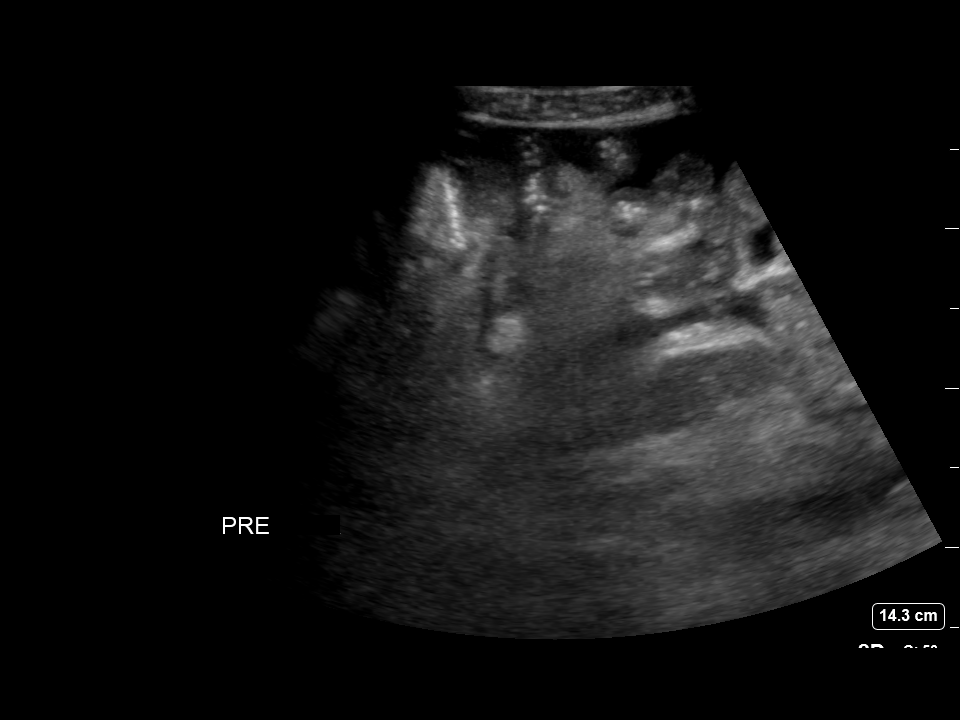

[2 of 2 positions shown; findings below may reference images not displayed]

FINDINGS: By ultrasound, there is only a very small amount of ascites in the
peritoneal cavity. There was not enough fluid to warrant
paracentesis today.
IMPRESSION: Small amount of fluid in the peritoneal cavity. Paracentesis was not
performed today.

## 2019-06-20 NOTE — Progress Notes (Deleted)
Primary Physician:  [name], FNP   Patient ID: [name], male    DOB: 1961/04/01, 58 y.o.   MRN: [id number]  Subjective:    No chief complaint on file.   HPI: [name]  is a 58 y.o. male  with ***  Past Medical History:  Diagnosis Date  . Anemia   . Anxiety   . Arthritis    left shoulder  . Atherosclerosis of aorta ([place])   . Cardiomegaly   . Chest pain    DATE UNKNOWN, C/O PERIODICALLY  . Cocaine abuse ([place])   . COPD exacerbation ([place]) 08/17/2016  . Coronary artery disease    stent 02/22/17  . ESRD (end stage renal disease) on dialysis ([place])    "[name]; MWF" (07/04/2017)  . GERD (gastroesophageal reflux disease)    DATE UNKNOWN  . Hemorrhoids   . Hepatitis B, chronic ([place])   . Hepatitis C   . History of kidney stones   . Hyperkalemia   . Hypertension   . Kidney failure   . Metabolic bone disease    Patient denies  . Mitral stenosis   . Myocardial infarction ([place])   . Pneumonia   . Pulmonary edema   . Solitary rectal ulcer syndrome 07/2017   at flex sig for rectal bleeding  . Tubular adenoma of colon     Past Surgical History:  Procedure Laterality Date  . A/V FISTULAGRAM Left 05/26/2017   Procedure: A/V FISTULAGRAM;  Surgeon: Conrad Ebensburg, MD;  Location: [place] [facility];  Service: Cardiovascular;  Laterality: Left;  . A/V FISTULAGRAM Right 11/18/2017   Procedure: A/V FISTULAGRAM - Right Arm;  Surgeon: Elam Dutch, MD;  Location: [place] [facility];  Service: Cardiovascular;  Laterality: Right;  . APPLICATION OF WOUND VAC Left 06/14/2017   Procedure: APPLICATION OF WOUND VAC;  Surgeon: Katha Cabal, MD;  Location: [facility];  Service: Vascular;  Laterality: Left;  . AV FISTULA PLACEMENT  2012   BELIEVED WAS PLACED IN JUNE  . AV FISTULA PLACEMENT Right 08/09/2017   Procedure: Creation Right arm ARTERIOVENOUS BRACHIOCEPOHALIC FISTULA;  Surgeon: Elam Dutch, MD;  Location: [facility];  Service: Vascular;   Laterality: Right;  . AV FISTULA PLACEMENT Right 11/22/2017   Procedure: INSERTION OF ARTERIOVENOUS (AV) GORE-TEX GRAFT RIGHT UPPER ARM;  Surgeon: Elam Dutch, MD;  Location: [place];  Service: Vascular;  Laterality: Right;  . BIOPSY  01/25/2018   Procedure: BIOPSY;  Surgeon: Jerene Bears, MD;  Location: [place];  Service: Gastroenterology;;  . BIOPSY  04/10/2019   Procedure: BIOPSY;  Surgeon: Jerene Bears, MD;  Location: [facility];  Service: Gastroenterology;;  . COLONOSCOPY    . COLONOSCOPY WITH PROPOFOL N/A 01/25/2018   Procedure: COLONOSCOPY WITH PROPOFOL;  Surgeon: Jerene Bears, MD;  Location: [place];  Service: Gastroenterology;  Laterality: N/A;  . CORONARY STENT INTERVENTION N/A 02/22/2017   Procedure: CORONARY STENT INTERVENTION;  Surgeon: Nigel Mormon, MD;  Location: [place] [facility];  Service: Cardiovascular;  Laterality: N/A;  . ESOPHAGOGASTRODUODENOSCOPY (EGD) WITH PROPOFOL N/A 01/25/2018   Procedure: ESOPHAGOGASTRODUODENOSCOPY (EGD) WITH PROPOFOL;  Surgeon: Jerene Bears, MD;  Location: [place];  Service: Gastroenterology;  Laterality: N/A;  . ESOPHAGOGASTRODUODENOSCOPY (EGD) WITH PROPOFOL N/A 04/10/2019   Procedure: ESOPHAGOGASTRODUODENOSCOPY (EGD) WITH PROPOFOL;  Surgeon: Jerene Bears, MD;  Location: [facility];  Service: Gastroenterology;  Laterality: N/A;  . FLEXIBLE SIGMOIDOSCOPY N/A 07/15/2017   Procedure:  FLEXIBLE SIGMOIDOSCOPY;  Surgeon: Carol Ada, MD;  Location: [place];  Service: Endoscopy;  Laterality: N/A;  . HEMORRHOID BANDING    . I&D EXTREMITY Left 06/01/2017   Procedure: IRRIGATION AND DEBRIDEMENT LEFT ARM HEMATOMA WITH LIGATION OF LEFT ARM AV FISTULA;  Surgeon: Elam Dutch, MD;  Location: [place];  Service: Vascular;  Laterality: Left;  . I&D EXTREMITY Left 06/14/2017   Procedure: IRRIGATION AND DEBRIDEMENT EXTREMITY;  Surgeon: Katha Cabal, MD;  Location: [facility];  Service: Vascular;  Laterality: Left;  . INSERTION  OF DIALYSIS CATHETER  05/30/2017  . INSERTION OF DIALYSIS CATHETER N/A 05/30/2017   Procedure: INSERTION OF DIALYSIS CATHETER;  Surgeon: Elam Dutch, MD;  Location: [place];  Service: Vascular;  Laterality: N/A;  . IR PARACENTESIS  08/30/2017  . IR PARACENTESIS  09/29/2017  . IR PARACENTESIS  10/28/2017  . IR PARACENTESIS  11/09/2017  . IR PARACENTESIS  11/16/2017  . IR PARACENTESIS  11/28/2017  . IR PARACENTESIS  12/01/2017  . IR PARACENTESIS  12/06/2017  . IR PARACENTESIS  01/03/2018  . IR PARACENTESIS  01/23/2018  . IR PARACENTESIS  02/07/2018  . IR PARACENTESIS  02/21/2018  . IR PARACENTESIS  03/06/2018  . IR PARACENTESIS  03/17/2018  . IR PARACENTESIS  04/04/2018  . IR PARACENTESIS  12/28/2018  . IR PARACENTESIS  01/08/2019  . IR PARACENTESIS  01/23/2019  . IR PARACENTESIS  02/01/2019  . IR PARACENTESIS  02/19/2019  . IR PARACENTESIS  03/01/2019  . IR PARACENTESIS  03/15/2019  . IR PARACENTESIS  04/03/2019  . IR PARACENTESIS  04/12/2019  . IR PARACENTESIS  05/01/2019  . IR PARACENTESIS  05/08/2019  . IR PARACENTESIS  05/24/2019  . IR PARACENTESIS  06/12/2019  . IR RADIOLOGIST EVAL & MGMT  02/14/2018  . IR RADIOLOGIST EVAL & MGMT  02/22/2019  . LEFT HEART CATH AND CORONARY ANGIOGRAPHY N/A 02/22/2017   Procedure: LEFT HEART CATH AND CORONARY ANGIOGRAPHY;  Surgeon: Nigel Mormon, MD;  Location: [place] [facility];  Service: Cardiovascular;  Laterality: N/A;  . LIGATION OF ARTERIOVENOUS  FISTULA Left 11/10/8839   Procedure: Plication of Left Arm Arteriovenous Fistula;  Surgeon: Elam Dutch, MD;  Location: [place];  Service: Vascular;  Laterality: Left;  . POLYPECTOMY    . POLYPECTOMY  01/25/2018   Procedure: POLYPECTOMY;  Surgeon: Jerene Bears, MD;  Location: [place];  Service: Gastroenterology;;  . REVISON OF ARTERIOVENOUS FISTULA Left 6/60/6301   Procedure: PLICATION OF DISTAL ANEURYSMAL SEGEMENT OF LEFT UPPER ARM ARTERIOVENOUS FISTULA;  Surgeon: Elam Dutch, MD;  Location: [place];   Service: Vascular;  Laterality: Left;  . REVISON OF ARTERIOVENOUS FISTULA Left 12/11/930   Procedure: Plication of Left Upper Arm Fistula ;  Surgeon: Waynetta Sandy, MD;  Location: [place];  Service: Vascular;  Laterality: Left;  . SKIN GRAFT SPLIT THICKNESS LEG / FOOT Left    SKIN GRAFT SPLIT THICKNESS LEFT ARM DONOR SITE: LEFT ANTERIOR THIGH  . SKIN SPLIT GRAFT Left 07/04/2017   Procedure: SKIN GRAFT SPLIT THICKNESS LEFT ARM DONOR SITE: LEFT ANTERIOR THIGH;  Surgeon: Elam Dutch, MD;  Location: [place];  Service: Vascular;  Laterality: Left;  . THROMBECTOMY W/ EMBOLECTOMY Left 06/05/2017   Procedure: EXPLORATION OF LEFT ARM FOR BLEEDING; OVERSEWED PROXIMAL FISTULA;  Surgeon: Angelia Mould, MD;  Location: [place];  Service: Vascular;  Laterality: Left;  . WOUND EXPLORATION Left 06/03/2017   Procedure: WOUND EXPLORATION WITH WOUND VAC APPLICATION TO LEFT  ARM;  Surgeon: Angelia Mould, MD;  Location: [facility];  Service: Vascular;  Laterality: Left;    Social History   Socioeconomic History  . Marital status: Single    Spouse name: Not on file  . Number of children: 3  . Years of education: 10  . Highest education level: Not on file  Occupational History  . Occupation: Unemployed  Social Needs  . Financial resource strain: Not on file  . Food insecurity    Worry: Not on file    Inability: Not on file  . Transportation needs    Medical: Not on file    Non-medical: Not on file  Tobacco Use  . Smoking status: Current Every Day Smoker    Packs/day: 0.50    Years: 43.00    Pack years: 21.50    Types: Cigarettes    Start date: 08/13/1973  . Smokeless tobacco: Never Used  . Tobacco comment: i dont know i just make   Substance and Sexual Activity  . Alcohol use: Not Currently    Frequency: Never    Comment: quit drinking in 2017  . Drug use: Not Currently    Types: Marijuana, Cocaine    Comment: reports using once every 3 months, 04-06-2019 was this   .  Sexual activity: Not on file  Lifestyle  . Physical activity    Days per week: Not on file    Minutes per session: Not on file  . Stress: Not on file  Relationships  . Social Herbalist on phone: Not on file    Gets together: Not on file    Attends religious service: Not on file    Active member of club or organization: Not on file    Attends meetings of clubs or organizations: Not on file    Relationship status: Not on file  . Intimate partner violence    Fear of current or ex partner: Not on file    Emotionally abused: Not on file    Physically abused: Not on file    Forced sexual activity: Not on file  Other Topics Concern  . Not on file  Social History Narrative   Lives alone   Caffeine use: Coffee-rare   Soda- daily      [facility] (03/10/17):   Originally from [facility]. Previously worked trimming trees. No pets currently. No bird or mold exposure.     ROS    Objective:  There were no vitals taken for this visit. There is no height or weight on file to calculate BMI.    Physical Exam Radiology: No results found.  Laboratory examination:   *** CMP Latest Ref Rng & Units 06/09/2019 06/09/2019 06/08/2019  Glucose 70 - 99 mg/dL 116(H) 102(H) 86  BUN 6 - 20 mg/dL 57(H) 52(H) 44(H)  Creatinine 0.61 - 1.24 mg/dL 10.57(H) 10.55(H) 9.62(H)  Sodium 135 - 145 mmol/L 136 135 139  Potassium 3.5 - 5.1 mmol/L 5.8(H) 5.8(H) 5.3(H)  Chloride 98 - 111 mmol/L 96(L) 95(L) 99  CO2 22 - 32 mmol/L 24 25 25   Calcium 8.9 - 10.3 mg/dL 10.1 10.0 9.8  Total Protein 6.5 - 8.1 g/dL - - 6.2(L)  Total Bilirubin 0.3 - 1.2 mg/dL - - 1.0  Alkaline Phos 38 - 126 U/L - - 126  AST 15 - 41 U/L - - 35  ALT 0 - 44 U/L - - 27   CBC Latest Ref Rng & Units 06/09/2019 06/09/2019 06/08/2019  WBC 4.0 -  10.5 K/uL 9.0 8.9 9.1  Hemoglobin 13.0 - 17.0 g/dL 8.7(L) 9.1(L) 8.8(L)  Hematocrit 39.0 - 52.0 % 24.4(L) 26.1(L) 25.2(L)  Platelets 150 - 400 K/uL 167 171 179   Lipid Panel      Component Value Date/Time   CHOL 103 08/17/2016 0134   TRIG 97 08/17/2016 0134   HDL 34 (L) 08/17/2016 0134   CHOLHDL 3.0 08/17/2016 0134   VLDL 19 08/17/2016 0134   LDLCALC 50 08/17/2016 0134   HEMOGLOBIN A1C Lab Results  Component Value Date   HGBA1C 4.6 (L) 04/24/2017   MPG 85.32 04/24/2017   TSH No results for input(s): TSH in the last 8760 hours.  PRN Meds:. There are no discontinued medications. No outpatient medications have been marked as taking for the 06/20/19 encounter (Appointment) with Miquel Dunn, NP.   Current Facility-Administered Medications for the 06/20/19 encounter (Appointment) with Miquel Dunn, NP  Medication  . albumin human 25 % solution 50 g    Cardiac Studies:   ***  Assessment:   No diagnosis found.  ***  Recommendations:   ***  Miquel Dunn, MSN, APRN, FNP-C [facility]. [place] Office: [phone] Fax: [phone]

## 2019-06-21 ENCOUNTER — Encounter: Payer: Self-pay | Admitting: Gastroenterology

## 2019-06-21 ENCOUNTER — Telehealth: Payer: Self-pay | Admitting: *Deleted

## 2019-06-21 ENCOUNTER — Ambulatory Visit (INDEPENDENT_AMBULATORY_CARE_PROVIDER_SITE_OTHER): Payer: Medicare Other | Admitting: Gastroenterology

## 2019-06-21 VITALS — BP 118/58 | HR 76 | Temp 98.5°F | Ht 70.0 in | Wt 144.2 lb

## 2019-06-21 DIAGNOSIS — I2510 Atherosclerotic heart disease of native coronary artery without angina pectoris: Secondary | ICD-10-CM

## 2019-06-21 DIAGNOSIS — R188 Other ascites: Secondary | ICD-10-CM

## 2019-06-21 DIAGNOSIS — K7460 Unspecified cirrhosis of liver: Secondary | ICD-10-CM | POA: Diagnosis not present

## 2019-06-21 NOTE — Telephone Encounter (Signed)
Left message for patient to call office. Mailed AVS. Need to inform patient of follow up and scheduled Paracentesis. Patient was unable to wait in the office.

## 2019-06-21 NOTE — Progress Notes (Signed)
06/21/2019 [name] [id number] [birth year]   HISTORY OF PRESENT ILLNESS: This is a 58 year old male who is a patient of Dr. Vena Rua.  He has a history of decompensated cirrhosis secondary to hepatitis B and C with recurrent ascites and history of SBP, end-stage renal disease on dialysis, history of atrial fibrillation, COPD, history of multiple colon polyps, GERD, prior substance abuse, who is seen for follow-up.  He is here alone today.  He was last seen by me in the office on 03/22/2019.  He underwent EGD with Dr. Hilarie Fredrickson on 04/10/2019 that showed the following:  - Normal mucosa was found in the entire esophagus. - 2 cm hiatal hernia. - Mild portal hypertensive gastropathy (nonbleeding at this time). - Gastritis (nonbleeding). Biopsied. - Normal examined duodenum.  He continues to struggle with recurrent ascites.  He gets large-volume paracentesis every 2 to 3 weeks on average.  Last was on December 1 with fluid studies negative for SBP.  Had CT scan of the abdomen pelvis with contrast on October 26 that showed the following:  IMPRESSION: No acute intrathoracic finding.  Cardiomegaly with reflux of contrast into the hepatic IVC and hepatic veins suggesting elevated right heart pressures or right heart failure.  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   He reports that he is taking his lactulose once a day, sometimes twice a day.  No confusion.  He is taking Protonix 40 mg a day.  He also reports compliance with Bactrim 1 double strength tablet daily, which he takes for SBP prophylaxis.   Dr. Merrilee Jansky with [facility] reviewed  the patient's chart.  Per note it was felt that he was not a liver transplant candidate due to issues with adherence to dialysis regimen.  This was also discussed with [name] at [facility] who believed that patient would not be a candidate due to his severe mitral stenosis and elevated PA pressures.  He actually feels well today.  Had recently been hospitalized for shortness of breath and volume overload, but says that he is feeling much better.  Does not feel that abdomen is extremely full or uncomfortable at this point.  Hemoglobin continues to bounce around with last 8.7 g on November 28.  He denies having evidence of GI bleeding with dark or bloody stools.   Past Medical History:  Diagnosis Date  . Anemia   . Anxiety   . Arthritis    left shoulder  . Atherosclerosis of aorta ([place])   . Cardiomegaly   . Chest pain    DATE UNKNOWN, C/O PERIODICALLY  . Cocaine abuse ([place])   . COPD exacerbation ([place]) 08/17/2016  . Coronary artery disease    stent 02/22/17  . ESRD (end stage renal disease) on dialysis ([place])    "[name]; MWF" (07/04/2017)  . GERD (gastroesophageal reflux disease)    DATE UNKNOWN  . Hemorrhoids   . Hepatitis B, chronic ([place])   . Hepatitis C   . History of kidney stones   . Hyperkalemia   . Hypertension   . Kidney failure   . Metabolic bone disease    Patient denies  . Mitral stenosis   . Myocardial infarction ([place])   . Pneumonia   . Pulmonary edema   .  Solitary rectal ulcer syndrome 07/2017   at flex [facility] for rectal bleeding  . Tubular adenoma of colon    Past Surgical History:  Procedure Laterality Date  . A/V FISTULAGRAM Left 05/26/2017   Procedure: A/V FISTULAGRAM;  Surgeon: Conrad Woodlake, MD;  Location: [place] [facility];  Service: Cardiovascular;  Laterality: Left;  . A/V FISTULAGRAM Right 11/18/2017   Procedure: A/V FISTULAGRAM - Right Arm;  Surgeon: Elam Dutch, MD;  Location: [place] [facility];  Service: Cardiovascular;  Laterality: Right;  .  APPLICATION OF WOUND VAC Left 06/14/2017   Procedure: APPLICATION OF WOUND VAC;  Surgeon: Katha Cabal, MD;  Location: [facility];  Service: Vascular;  Laterality: Left;  . AV FISTULA PLACEMENT  2012   BELIEVED WAS PLACED IN JUNE  . AV FISTULA PLACEMENT Right 08/09/2017   Procedure: Creation Right arm ARTERIOVENOUS BRACHIOCEPOHALIC FISTULA;  Surgeon: Elam Dutch, MD;  Location: [facility];  Service: Vascular;  Laterality: Right;  . AV FISTULA PLACEMENT Right 11/22/2017   Procedure: INSERTION OF ARTERIOVENOUS (AV) GORE-TEX GRAFT RIGHT UPPER ARM;  Surgeon: Elam Dutch, MD;  Location: [place];  Service: Vascular;  Laterality: Right;  . BIOPSY  01/25/2018   Procedure: BIOPSY;  Surgeon: Jerene Bears, MD;  Location: [place];  Service: Gastroenterology;;  . BIOPSY  04/10/2019   Procedure: BIOPSY;  Surgeon: Jerene Bears, MD;  Location: [facility];  Service: Gastroenterology;;  . COLONOSCOPY    . COLONOSCOPY WITH PROPOFOL N/A 01/25/2018   Procedure: COLONOSCOPY WITH PROPOFOL;  Surgeon: Jerene Bears, MD;  Location: [place];  Service: Gastroenterology;  Laterality: N/A;  . CORONARY STENT INTERVENTION N/A 02/22/2017   Procedure: CORONARY STENT INTERVENTION;  Surgeon: Nigel Mormon, MD;  Location: [place] [facility];  Service: Cardiovascular;  Laterality: N/A;  . ESOPHAGOGASTRODUODENOSCOPY (EGD) WITH PROPOFOL N/A 01/25/2018   Procedure: ESOPHAGOGASTRODUODENOSCOPY (EGD) WITH PROPOFOL;  Surgeon: Jerene Bears, MD;  Location: [place];  Service: Gastroenterology;  Laterality: N/A;  . ESOPHAGOGASTRODUODENOSCOPY (EGD) WITH PROPOFOL N/A 04/10/2019   Procedure: ESOPHAGOGASTRODUODENOSCOPY (EGD) WITH PROPOFOL;  Surgeon: Jerene Bears, MD;  Location: [facility];  Service: Gastroenterology;  Laterality: N/A;  . FLEXIBLE SIGMOIDOSCOPY N/A 07/15/2017   Procedure: FLEXIBLE SIGMOIDOSCOPY;  Surgeon: Carol Ada, MD;  Location: [place];  Service: Endoscopy;  Laterality: N/A;  . HEMORRHOID  BANDING    . I&D EXTREMITY Left 06/01/2017   Procedure: IRRIGATION AND DEBRIDEMENT LEFT ARM HEMATOMA WITH LIGATION OF LEFT ARM AV FISTULA;  Surgeon: Elam Dutch, MD;  Location: [place];  Service: Vascular;  Laterality: Left;  . I&D EXTREMITY Left 06/14/2017   Procedure: IRRIGATION AND DEBRIDEMENT EXTREMITY;  Surgeon: Katha Cabal, MD;  Location: [facility];  Service: Vascular;  Laterality: Left;  . INSERTION OF DIALYSIS CATHETER  05/30/2017  . INSERTION OF DIALYSIS CATHETER N/A 05/30/2017   Procedure: INSERTION OF DIALYSIS CATHETER;  Surgeon: Elam Dutch, MD;  Location: [place];  Service: Vascular;  Laterality: N/A;  . IR PARACENTESIS  08/30/2017  . IR PARACENTESIS  09/29/2017  . IR PARACENTESIS  10/28/2017  . IR PARACENTESIS  11/09/2017  . IR PARACENTESIS  11/16/2017  . IR PARACENTESIS  11/28/2017  . IR PARACENTESIS  12/01/2017  . IR PARACENTESIS  12/06/2017  . IR PARACENTESIS  01/03/2018  . IR PARACENTESIS  01/23/2018  . IR PARACENTESIS  02/07/2018  . IR PARACENTESIS  02/21/2018  . IR PARACENTESIS  03/06/2018  . IR PARACENTESIS  03/17/2018  . IR  PARACENTESIS  04/04/2018  . IR PARACENTESIS  12/28/2018  . IR PARACENTESIS  01/08/2019  . IR PARACENTESIS  01/23/2019  . IR PARACENTESIS  02/01/2019  . IR PARACENTESIS  02/19/2019  . IR PARACENTESIS  03/01/2019  . IR PARACENTESIS  03/15/2019  . IR PARACENTESIS  04/03/2019  . IR PARACENTESIS  04/12/2019  . IR PARACENTESIS  05/01/2019  . IR PARACENTESIS  05/08/2019  . IR PARACENTESIS  05/24/2019  . IR PARACENTESIS  06/12/2019  . IR RADIOLOGIST EVAL & MGMT  02/14/2018  . IR RADIOLOGIST EVAL & MGMT  02/22/2019  . LEFT HEART CATH AND CORONARY ANGIOGRAPHY N/A 02/22/2017   Procedure: LEFT HEART CATH AND CORONARY ANGIOGRAPHY;  Surgeon: Nigel Mormon, MD;  Location: [place] [facility];  Service: Cardiovascular;  Laterality: N/A;  . LIGATION OF ARTERIOVENOUS  FISTULA Left 3/0/8657   Procedure: Plication of Left Arm Arteriovenous Fistula;  Surgeon: Elam Dutch, MD;  Location: [place];  Service: Vascular;  Laterality: Left;  . POLYPECTOMY    . POLYPECTOMY  01/25/2018   Procedure: POLYPECTOMY;  Surgeon: Jerene Bears, MD;  Location: [name];  Service: Gastroenterology;;  . REVISON OF ARTERIOVENOUS FISTULA Left 8/46/9629   Procedure: PLICATION OF DISTAL ANEURYSMAL SEGEMENT OF LEFT UPPER ARM ARTERIOVENOUS FISTULA;  Surgeon: Elam Dutch, MD;  Location: [place];  Service: Vascular;  Laterality: Left;  . REVISON OF ARTERIOVENOUS FISTULA Left 12/07/4130   Procedure: Plication of Left Upper Arm Fistula ;  Surgeon: Waynetta Sandy, MD;  Location: [place];  Service: Vascular;  Laterality: Left;  . SKIN GRAFT SPLIT THICKNESS LEG / FOOT Left    SKIN GRAFT SPLIT THICKNESS LEFT ARM DONOR SITE: LEFT ANTERIOR THIGH  . SKIN SPLIT GRAFT Left 07/04/2017   Procedure: SKIN GRAFT SPLIT THICKNESS LEFT ARM DONOR SITE: LEFT ANTERIOR THIGH;  Surgeon: Elam Dutch, MD;  Location: [place];  Service: Vascular;  Laterality: Left;  . THROMBECTOMY W/ EMBOLECTOMY Left 06/05/2017   Procedure: EXPLORATION OF LEFT ARM FOR BLEEDING; OVERSEWED PROXIMAL FISTULA;  Surgeon: Angelia Mould, MD;  Location: [place];  Service: Vascular;  Laterality: Left;  . WOUND EXPLORATION Left 06/03/2017   Procedure: WOUND EXPLORATION WITH WOUND VAC APPLICATION TO LEFT ARM;  Surgeon: Angelia Mould, MD;  Location: [place];  Service: Vascular;  Laterality: Left;    reports that he has been smoking cigarettes. He started smoking about 45 years ago. He has a 21.50 pack-year smoking history. He has never used smokeless tobacco. He reports previous alcohol use. He reports previous drug use. Drugs: Marijuana and Cocaine. family history includes COPD in his father and another family member; Esophageal cancer in his sister; Heart disease in his father and mother; Hypertension in an other family member; Lung cancer in his mother; Malignant hyperthermia in his father; Throat cancer in his  sister. Allergies  Allergen Reactions  . Morphine And Related Other (See Comments)    Stomach pain  . Aspirin Other (See Comments)    STOMACH PAIN  . Clonidine Derivatives Itching  . Tramadol Itching  . Tylenol [Acetaminophen] Nausea Only    Stomach ache      Outpatient Encounter Medications as of 06/21/2019  Medication Sig  . albuterol (VENTOLIN HFA) 108 (90 Base) MCG/ACT inhaler Inhale 2 puffs into the lungs every 6 (six) hours as needed for wheezing or shortness of breath.  . hydrALAZINE (APRESOLINE) 100 MG tablet Take 0.5 tablets (50 mg total) by mouth 3 (three) times daily. (Patient taking  differently: Take 100 mg by mouth 3 (three) times daily. )  . lactulose (CHRONULAC) 10 GM/15ML solution TAKE 45 MLS BY MOUTH 2 TIMES DAILY. (Patient taking differently: Take 30 g by mouth 2 (two) times daily. )  . loperamide (IMODIUM A-D) 2 MG tablet Take 2 mg by mouth 4 (four) times daily as needed for diarrhea or loose stools.  . metoprolol tartrate (LOPRESSOR) 100 MG tablet Take 1 tablet (100 mg total) by mouth 2 (two) times daily.  . montelukast (SINGULAIR) 10 MG tablet Take 10 mg by mouth every evening.   Marland Kitchen oxyCODONE (OXY IR/ROXICODONE) 5 MG immediate release tablet Take 5 mg by mouth 2 (two) times daily as needed for moderate pain or severe pain.  . pantoprazole (PROTONIX) 40 MG tablet Take 1 tablet (40 mg total) by mouth daily. (Patient taking differently: Take 40 mg by mouth See admin instructions. Take 40 mg by mouth in the morning before breakfast and an additional 40 mg once a day, if symptoms persist)  . sevelamer carbonate (RENVELA) 800 MG tablet Take 4 tablets with meals  And 2 tablets twice daily with snacks (Patient taking differently: Take 1,600-2,400 mg by mouth See admin instructions. Take 2,400 mg by mouth three times a day with meals and 1,600 mg with each snack)  . sulfamethoxazole-trimethoprim (BACTRIM DS) 800-160 MG tablet Take 1 tablet by mouth daily. (Patient taking  differently: Take 1 tablet by mouth 2 (two) times daily. )  . SYMBICORT 160-4.5 MCG/ACT inhaler Inhale 2 puffs into the lungs 2 (two) times daily.   Marland Kitchen zolpidem (AMBIEN) 10 MG tablet Take 10 mg by mouth at bedtime as needed for sleep.  . [DISCONTINUED] albumin human 25 % solution 50 g    No facility-administered encounter medications on file as of 06/21/2019.     REVIEW OF SYSTEMS  : All other systems reviewed and negative except where noted in the History of Present Illness.   PHYSICAL EXAM: BP (!) 118/58   Pulse 76   Temp 98.5 F (36.9 C)   Ht 5\' 10"  (1.778 m)   Wt 144 lb 3.2 oz (65.4 kg)   BMI 20.69 kg/m  General: Well developed black male in no acute distress Head: Normocephalic and atraumatic Eyes:  Sclerae anicteric, conjunctiva pink. Ears: Normal auditory acuity Lungs: Clear throughout to auscultation; no increased WOB. Heart: Regular rate and rhythm; no M/R/G. Abdomen: Softly distended with ascites fluid.  BS present.  Non-tender. Musculoskeletal: Symmetrical with no gross deformities  Skin: No lesions on visible extremities Extremities: No edema  Neurological: Alert oriented x 4, grossly non-focal Psychological:  Alert and cooperative. Normal mood and affect  ASSESSMENT AND PLAN: 58 year old male with a history of decompensated cirrhosis secondary to hepatitis B and C with recurrent ascites and history of SBP, end-stage renal disease on dialysis, history of atrial fibrillation, COPD, history of multiple colon polyps, GERD, prior substance abuse, who is seen for follow-up.  1.Cirrhosis with recurrent ascites --he has decompensated cirrhosis due to prior alcohol use in the setting of hepatitis B and C. Deemed not a liver transplant candidate by [facility] and [facility] due to severe mitral stenosis and elevated PA pressures.  His end-stage renal disease and dialysis makes management of his ascites more difficult.  Has been getting LVP every 2-3 weeks.  He should continue low-sodium  diet --For now we will continue large-volume paracentesis every 2-3 weeks.  He usually calls our office when he is becoming symptomatic.  His last tap was about 9  days ago.  He would like [place] to try to schedule something for later this month, the week between Christmas and New Year's.  Will check fluid for cell count and culture. --Low-sodium diet. --He denies any sign of GI bleeding at this point.  Hemoglobin continues to bounce around.  Has history of portal hypertensive gastropathy.  Due for  --Continue pantoprazole 40 mg daily. --HCC screening up-to-date with recent cross-sectional imaging/CT abdomen pelvis in October 2020. --No evidence for hepatic encephalopathy today--continue lactulose 30 g twice daily.  2. Viral hepatitis, history of hepatitis B and C --His liver enzymes are normal.  3. History of multiple colon polyps --up-to-date with screening and surveillance colonoscopy. Repeat colonoscopy recommended July 2022  4. End-stage renal disease --on hemodialysis, Monday Wednesday and Friday.  5. GERD --he does not have specific GERD symptoms. I think continuing daily PPI is reasonable in the setting of cirrhosis for gastric protection.  **I will have him follow-up with Dr. Hilarie Fredrickson in about 4 to 6 weeks.  CC:  [name], FNP

## 2019-06-21 NOTE — Patient Instructions (Signed)
If you are age 56 or older, your body mass index should be between 23-30. Your Body mass index is 20.69 kg/m. If this is out of the aforementioned range listed, please consider follow up with your Primary Care Provider.  If you are age 37 or younger, your body mass index should be between 19-25. Your Body mass index is 20.69 kg/m. If this is out of the aformentioned range listed, please consider follow up with your Primary Care Provider.   You have been scheduled for an abdominal paracentesis at [facility] radiology (1st floor of hospital) on Tuesday 07/10/19 at 10 am. Please arrive at least 15 minutes prior to your appointment time for registration. Should you need to reschedule this appointment for any reason, please call our office at [phone].   Follow up with Dr. Hilarie Fredrickson on Friday 08/10/19 @ 10:30 am.

## 2019-06-22 ENCOUNTER — Other Ambulatory Visit: Payer: Self-pay | Admitting: Internal Medicine

## 2019-06-22 NOTE — Progress Notes (Signed)
Addendum: Reviewed and agree with assessment and management plan. Ron Beske M, MD  

## 2019-06-24 IMAGING — DX DG CHEST 2V
2 series · 2 of 2 positions shown · non-contrast
Comparison: Chest x-rays dated 12/19/2017 and 12/07/2017.

CLINICAL DATA: Chest pain

EXAM:
CHEST - 2 VIEW

[chest pa]
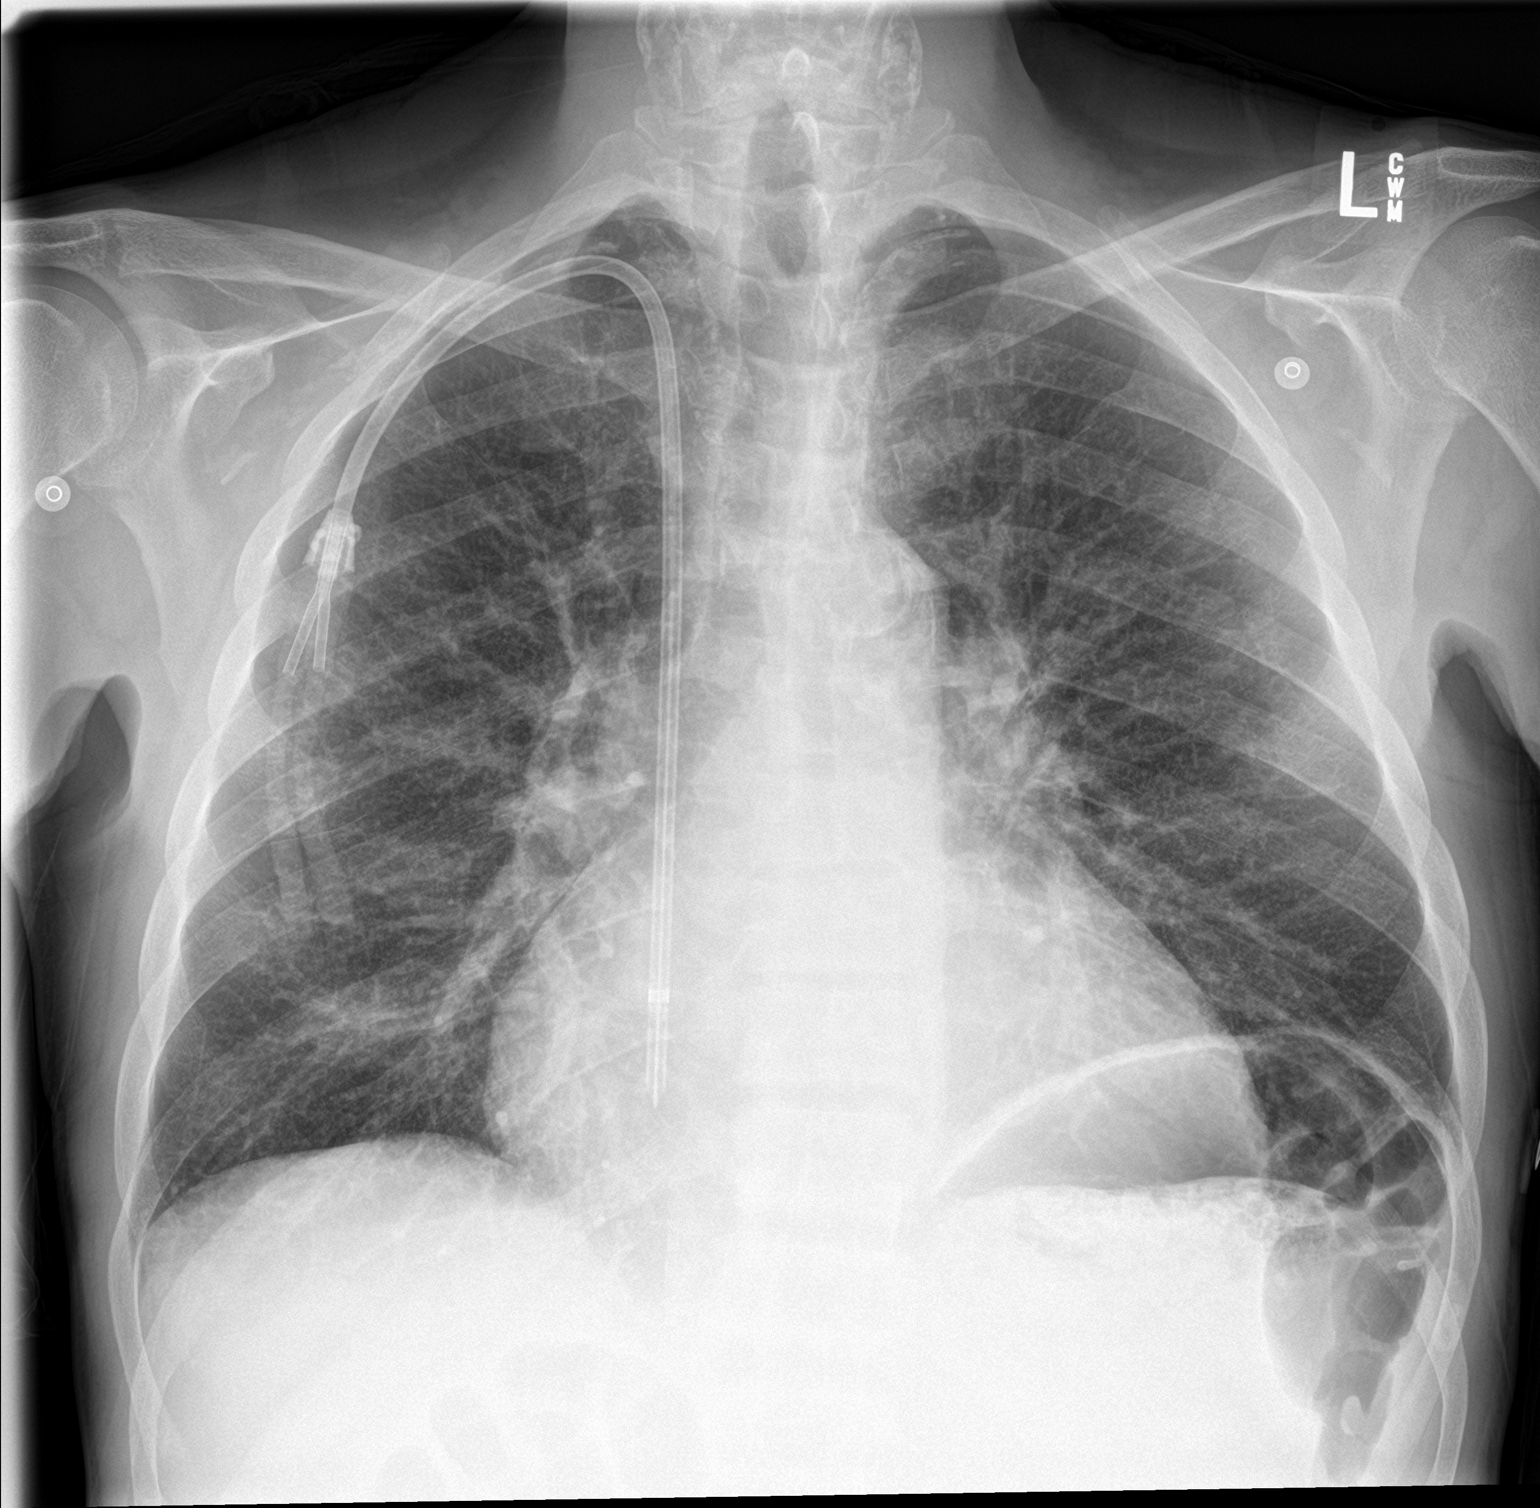

[chest lat]
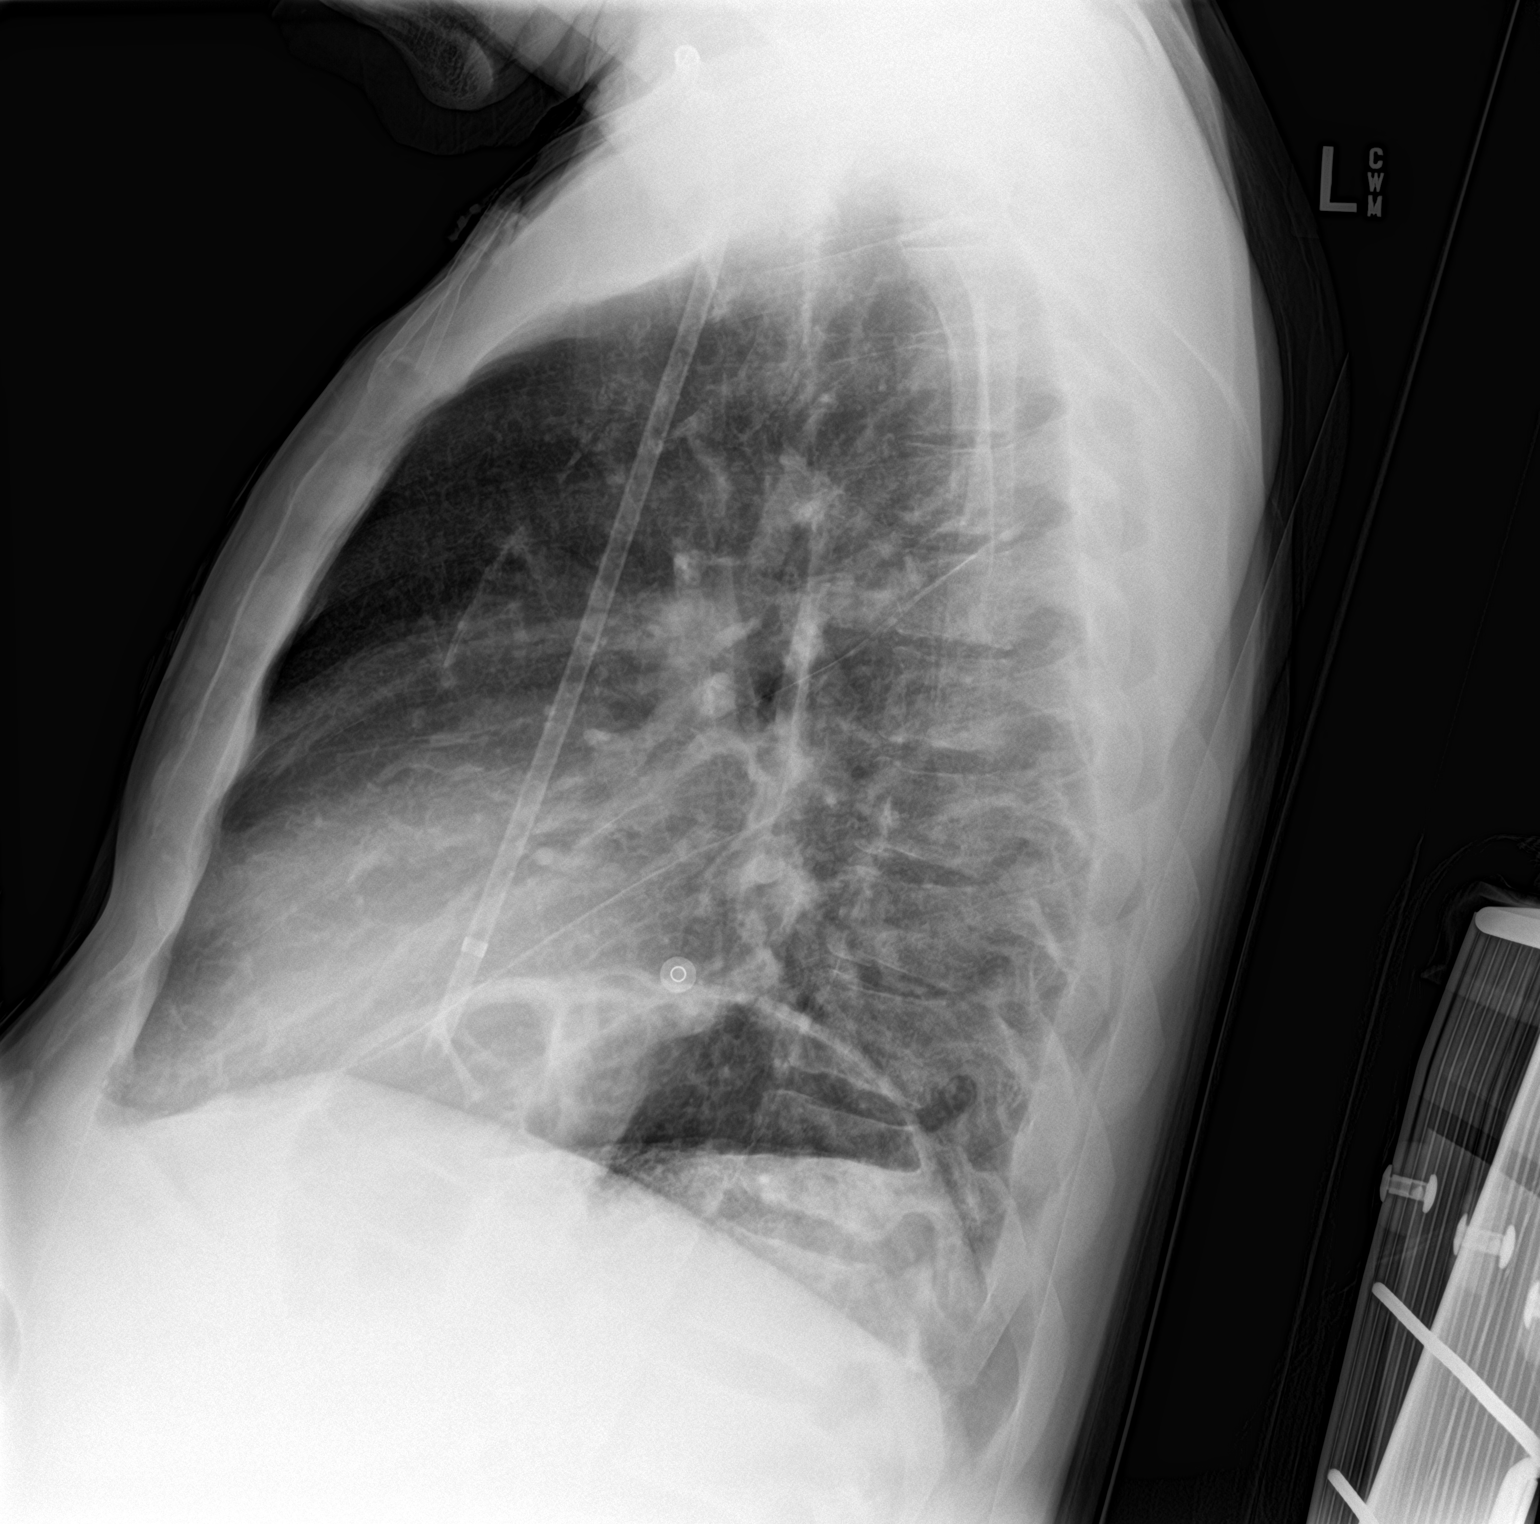

[2 of 2 positions shown; findings below may reference images not displayed]

FINDINGS: Stable cardiomegaly. Aortic atherosclerosis. Central pulmonary
vascular congestion is stable compared to the most recent exam, with
associated mild bilateral interstitial edema. No confluent opacity
to suggest a developing pneumonia. No pleural effusion or
pneumothorax seen.

No acute or suspicious osseous finding. Central catheter is stable
in position with tip overlying the RIGHT atrium.
IMPRESSION: 1. Stable cardiomegaly with central pulmonary vascular congestion
and mild bilateral interstitial edema, consistent with persistent
mild CHF.
2. No evidence of pneumonia.
3. Aortic atherosclerosis.

## 2019-06-27 ENCOUNTER — Other Ambulatory Visit: Payer: Self-pay | Admitting: Radiation Oncology

## 2019-06-30 IMAGING — CR DG ABDOMEN ACUTE W/ 1V CHEST
4 series · 4 of 4 positions shown · non-contrast
Comparison: Chest 12/23/2017

CLINICAL DATA: Acute on chronic abdominal pain and distention.
End-stage renal disease, last dialysis yesterday.

EXAM:
DG ABDOMEN ACUTE W/ 1V CHEST

[chest pa]
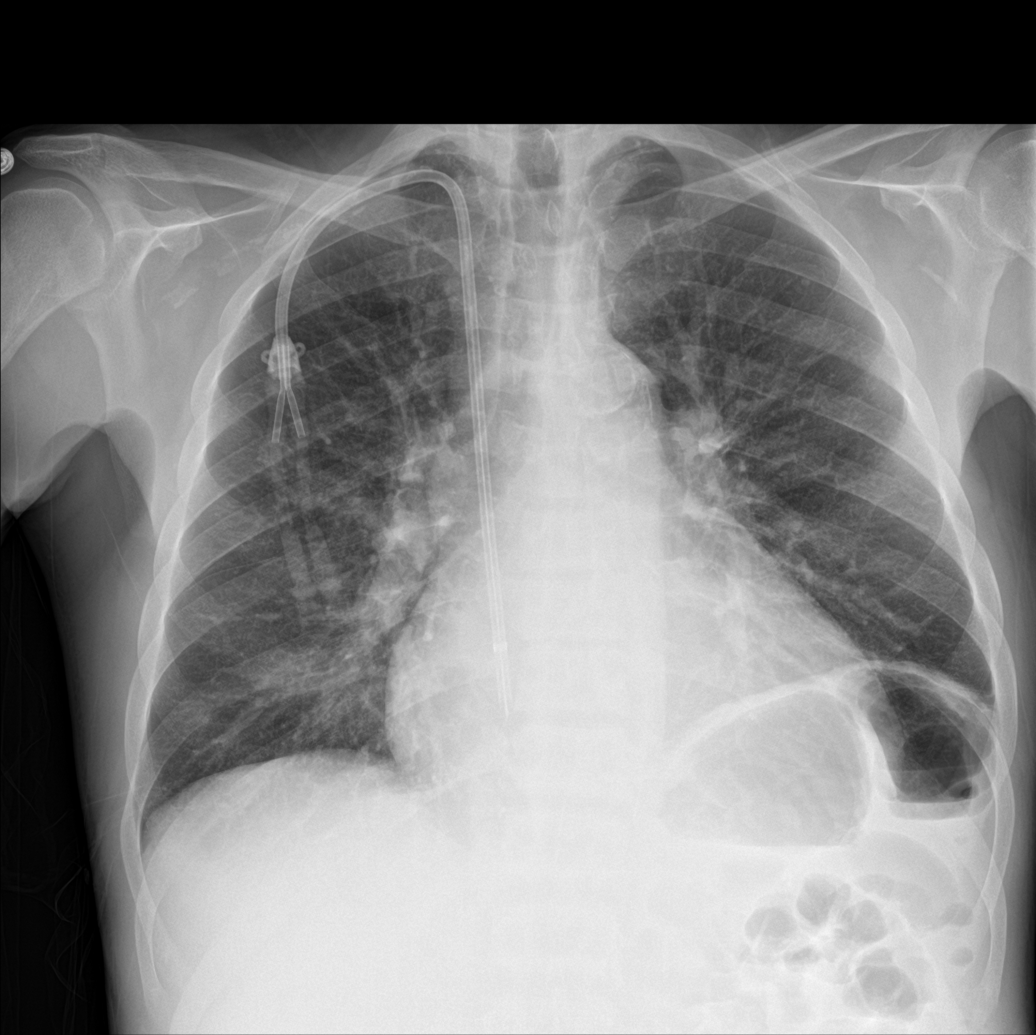

[abdomen supine (1 of 3)]
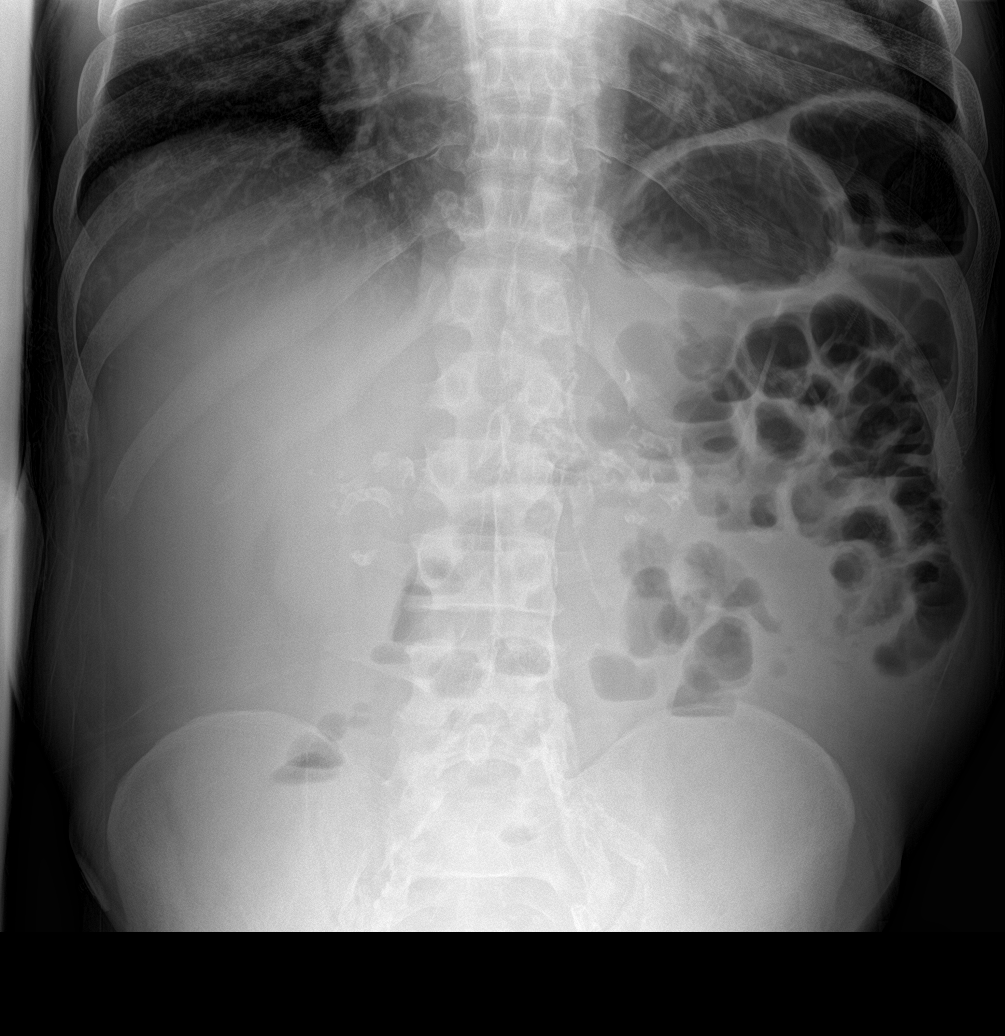

[abdomen supine (2 of 3)]
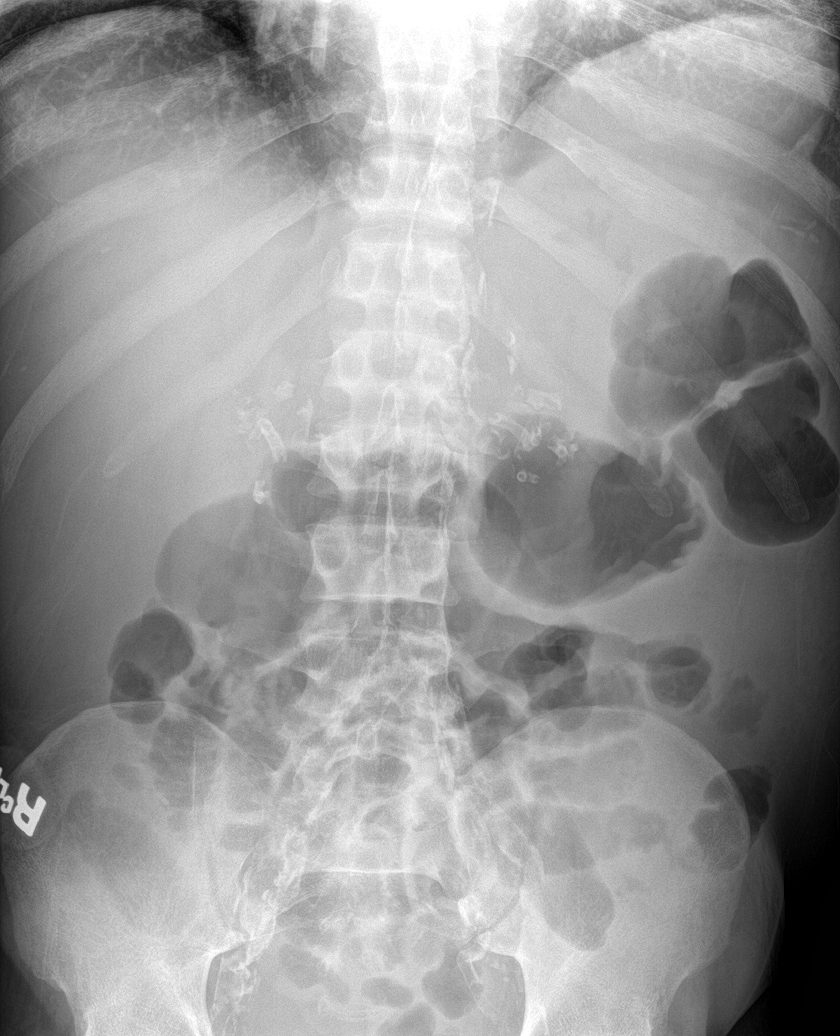

[abdomen supine (3 of 3)]
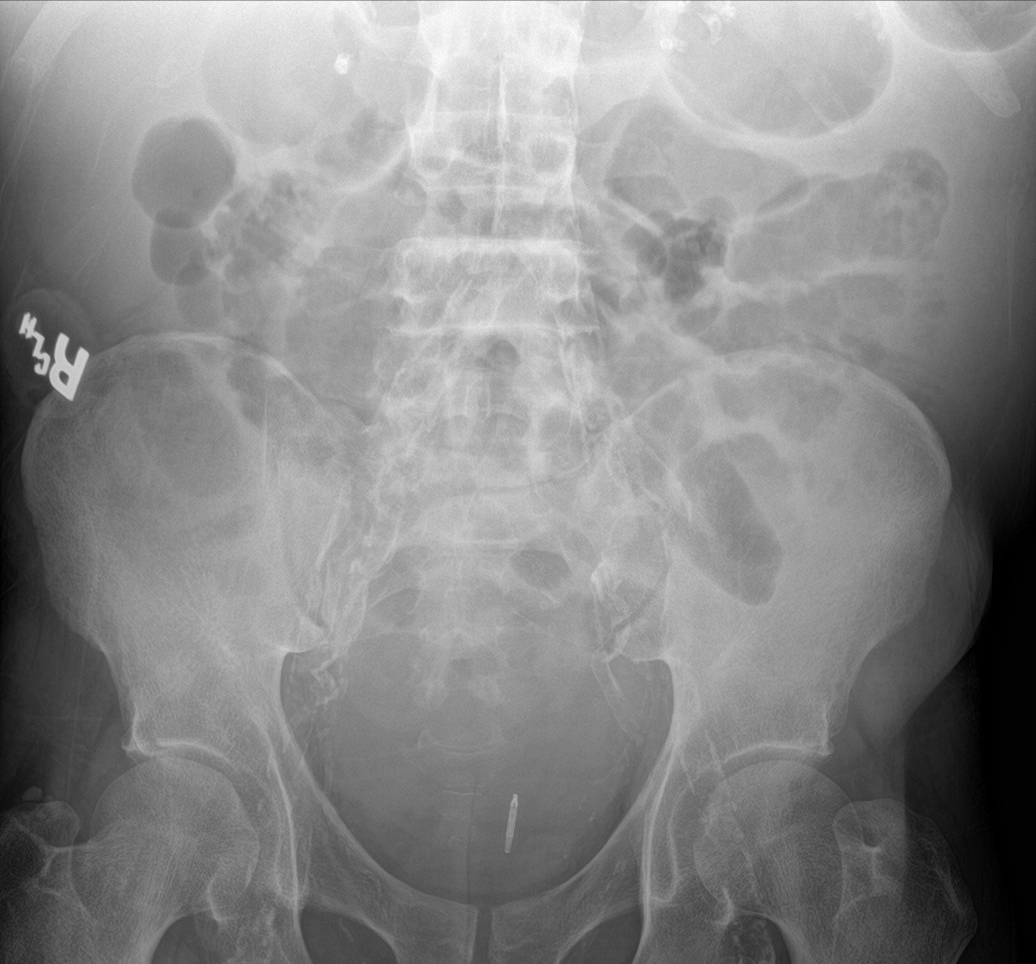

[4 of 4 positions shown; findings below may reference images not displayed]

FINDINGS: Shallow inspiration with elevation of the left hemidiaphragm and
mild linear atelectasis in the left lung base. Cardiac enlargement
with mild central vascular congestion. No edema or consolidation. No
blunting of costophrenic angles. No pneumothorax. Right central
venous catheter with tip over the right atrium. Calcification of the
aorta.

Gas-filled central mid abdominal small and large bowel with mild
distention. There a few air-fluid levels on the upright view. This
could represent early small bowel obstruction or enteritis. No free
air. No radiopaque stones. Extensive vascular calcifications in the
abdomen. Probable surgical clip in the pelvis without change.
Degenerative changes in the spine and hips.
IMPRESSION: 1. Cardiac enlargement with mild central vascular congestion. No
edema or consolidation.
2. Gas-filled mildly distended central abdominal bowel with a few
air-fluid levels suggesting early obstruction or enteritis.
3. Extensive vascular calcifications.

## 2019-07-01 IMAGING — CR DG CHEST 2V
2 series · 2 of 2 positions shown · non-contrast
Comparison: Chest and abdominal series 12/29/2017 and earlier.

CLINICAL DATA: 57-year-old male with increasing shortness of breath
for 2 days. Dialysis today. Wheezing.

EXAM:
CHEST - 2 VIEW

[chest lat]
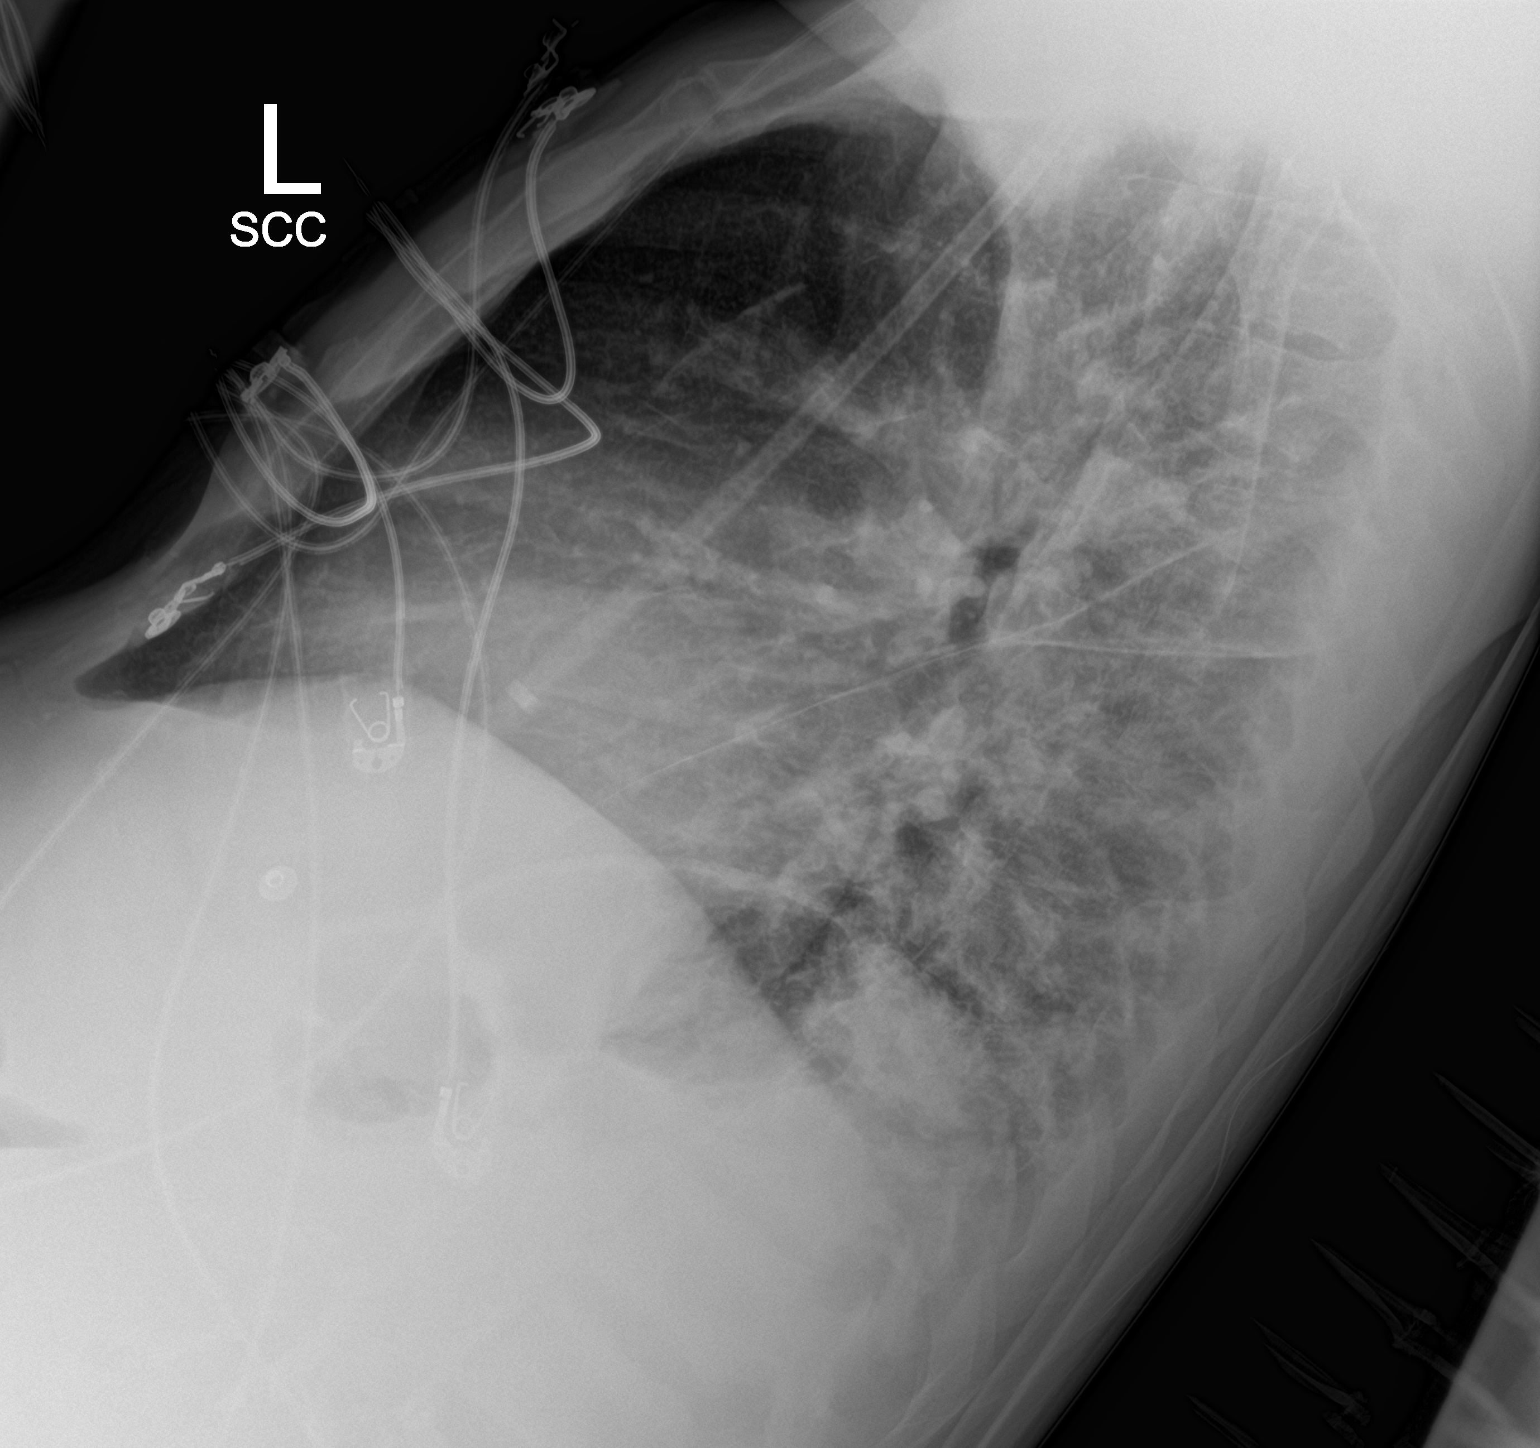

[chest ap]
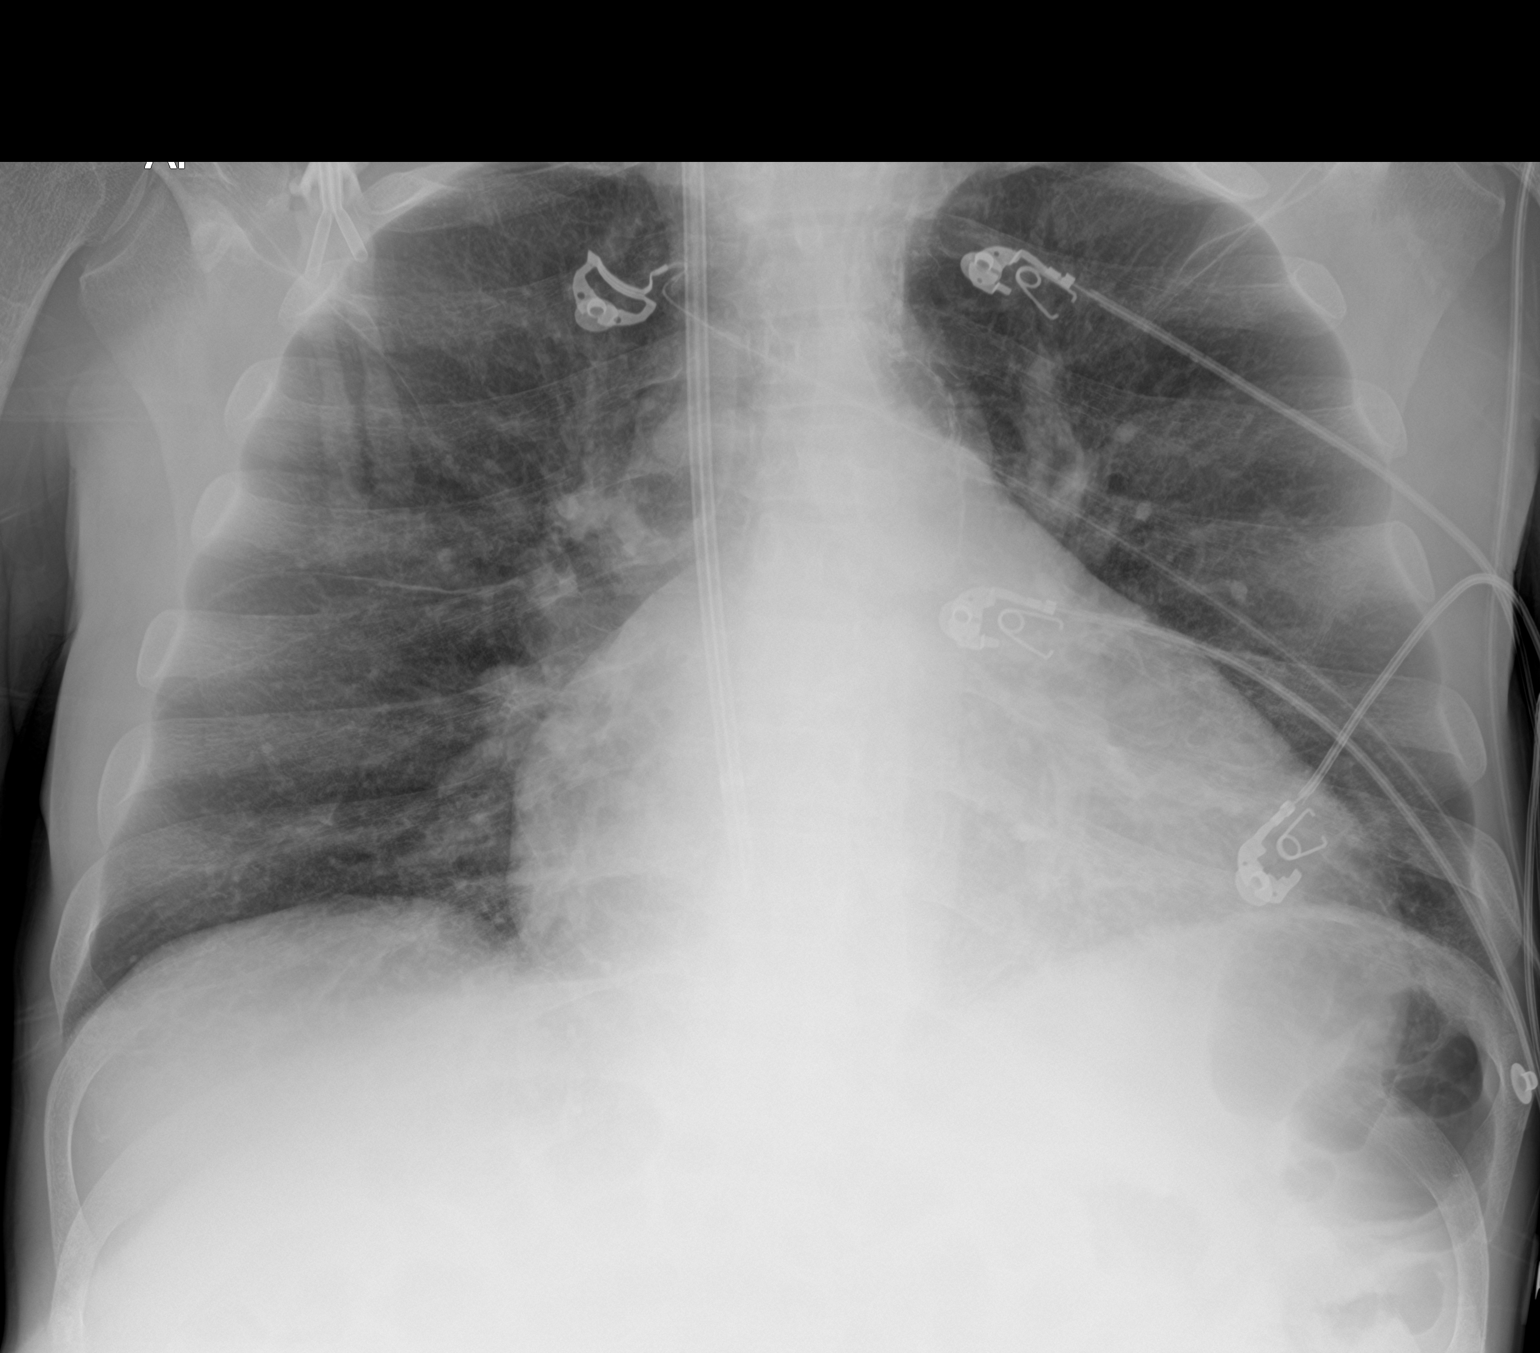

[2 of 2 positions shown; findings below may reference images not displayed]

FINDINGS: Stable right chest dual lumen dialysis catheter. Stable cardiomegaly
and mediastinal contours. Stable lung volumes. Pulmonary vascularity
appears stable without pulmonary edema. No pneumothorax. No pleural
effusion. There is streaky increased lower lobe opacity on the
lateral view. No consolidation. Visualized tracheal air column is
within normal limits. Negative visible bowel gas pattern. Bony
sclerosis likely reflecting renal osteodystrophy.
IMPRESSION: 1. Nonspecific mild streaky lower lobe opacity. If there are signs
and symptoms of infection consider developing pneumonia.
2. No pleural effusion or other acute cardiopulmonary abnormality.
3. Stable cardiomegaly.

## 2019-07-05 IMAGING — US IR PARACENTESIS
1 series · 5 of 5 positions shown · non-contrast
Comparison: Multiple previous ultrasound-guided paracenteses, most
recently on 12/19/2017;

INDICATION: Recurrent symptomatic ascites. Please perform ultrasound-guided
paracentesis for diagnostic and therapeutic purposes.

EXAM:
ULTRASOUND-GUIDED PARACENTESIS
TECHNIQUE: Informed written consent was obtained from the patient after a
discussion of the risks, benefits and alternatives to treatment. A
timeout was performed prior to the initiation of the procedure.

[Series 1: ir (id) (id)/(id)/(id) ir · 5 of 5 slices shown]
[im 1/5]
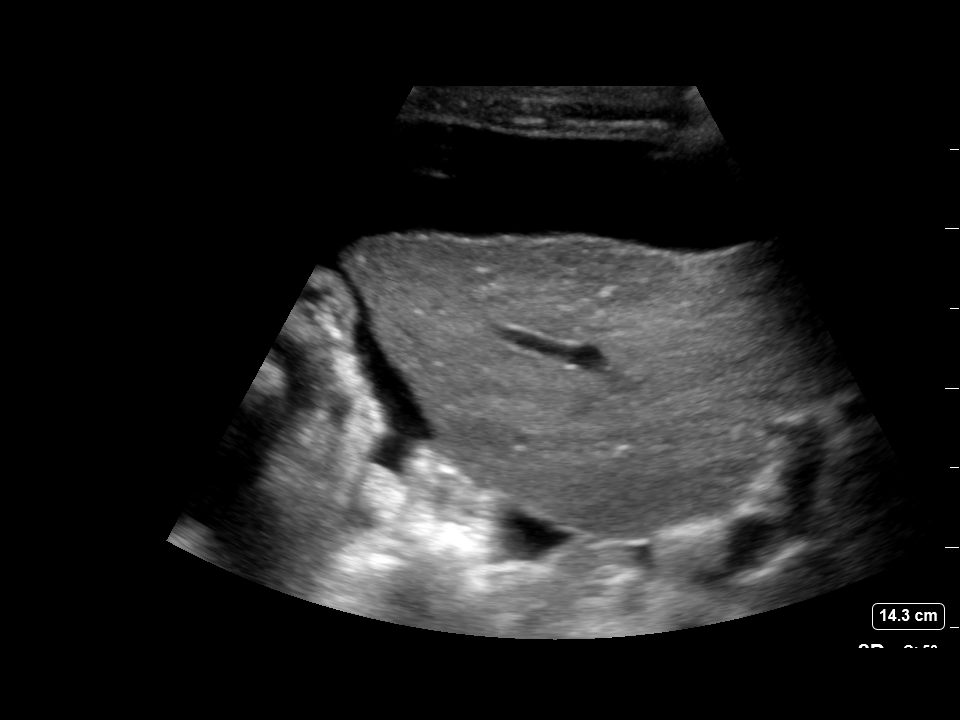
[im 2/5]
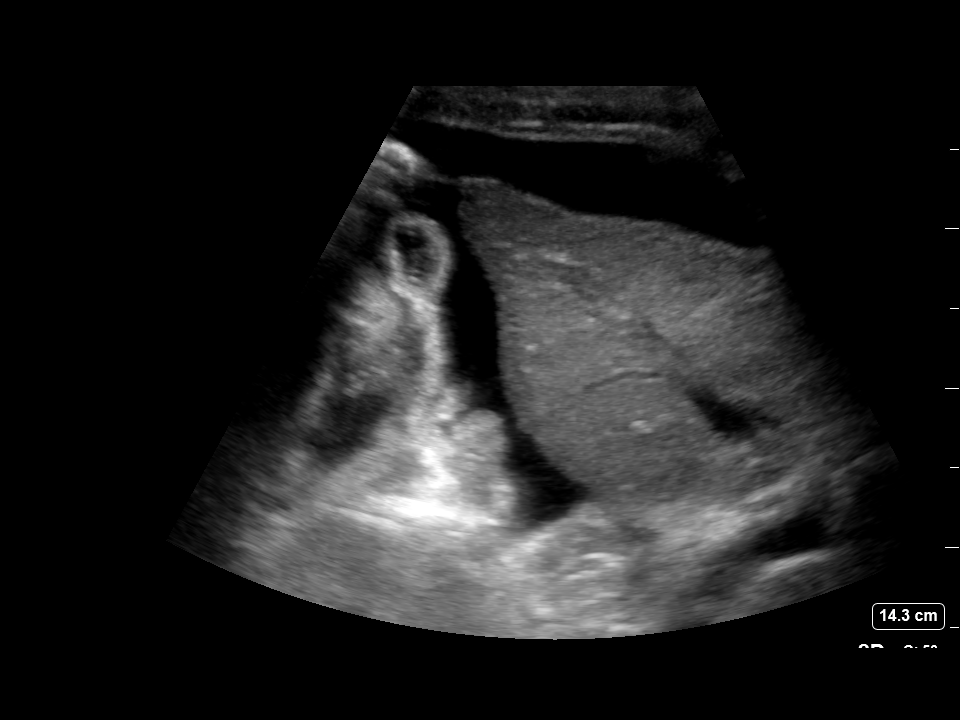
[im 3/5]
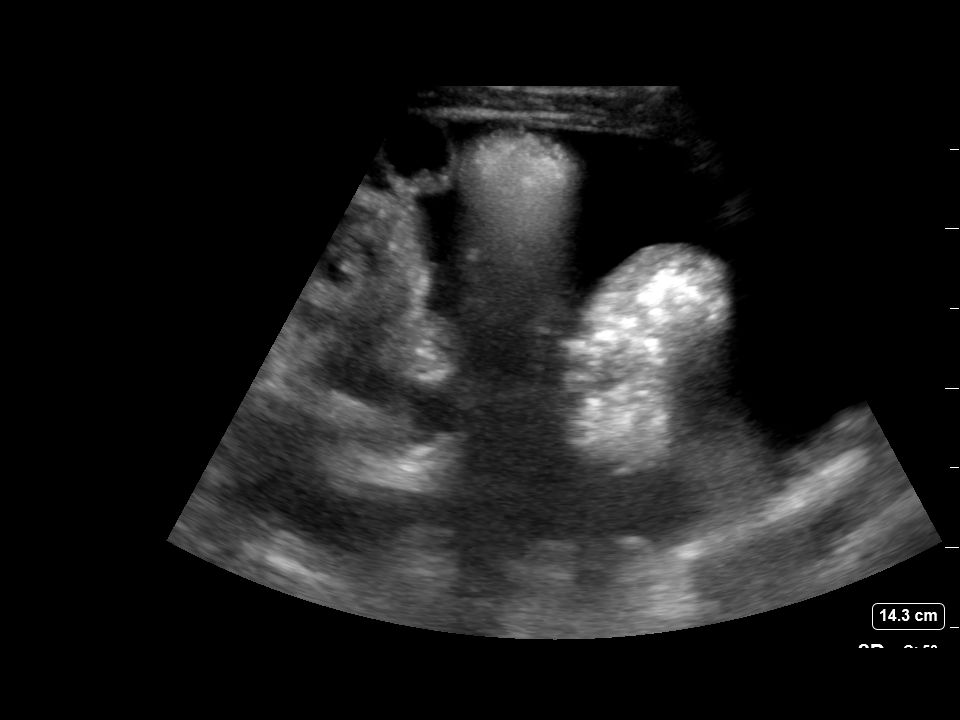
[im 4/5]
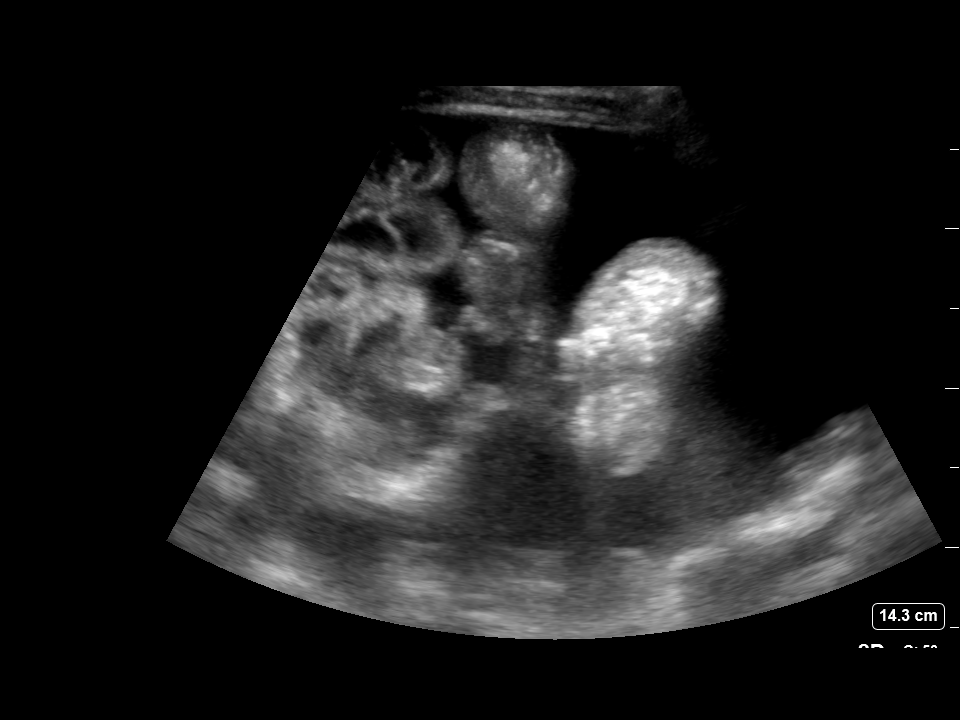
[im 5/5]
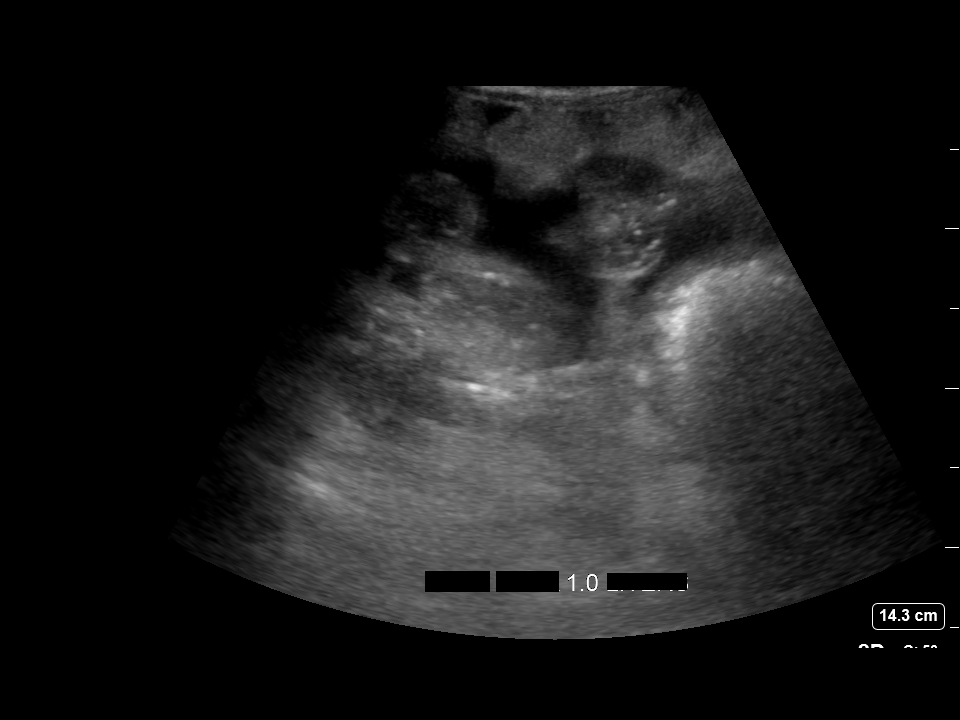

[5 of 5 positions shown; findings below may reference images not displayed]

CT abdomen and pelvis - 01/02/2018

MEDICATIONS:
None.

COMPLICATIONS:
None immediate.
Initial ultrasound scanning demonstrates a moderate amount of
ascites within the right lower abdominal quadrant. The right lower
abdomen was prepped and draped in the usual sterile fashion. 1%
lidocaine with epinephrine was used for local anesthesia. A 19
gauge, 7-cm, Yueh catheter was introduced. An ultrasound image was
saved for documentation purposed. The paracentesis was performed.
The catheter was removed and a dressing was applied. The patient
tolerated the procedure well without immediate post procedural
complication.
FINDINGS: A total of approximately 2.1 liters of serous fluid was removed.
Samples were sent to the laboratory as requested by the clinical
team.
IMPRESSION: Successful ultrasound-guided paracentesis yielding 2.1 liters of
peritoneal fluid.

## 2019-07-09 ENCOUNTER — Emergency Department (HOSPITAL_COMMUNITY): Payer: Medicare Other

## 2019-07-09 ENCOUNTER — Encounter (HOSPITAL_COMMUNITY): Payer: Self-pay | Admitting: Emergency Medicine

## 2019-07-09 ENCOUNTER — Other Ambulatory Visit: Payer: Self-pay

## 2019-07-09 ENCOUNTER — Emergency Department (HOSPITAL_COMMUNITY)
Admission: EM | Admit: 2019-07-09 | Discharge: 2019-07-09 | Disposition: A | Discharge status: Home / Self Care | Payer: Medicare Other | Attending: Emergency Medicine | Admitting: Emergency Medicine

## 2019-07-09 DIAGNOSIS — B182 Chronic viral hepatitis C: Secondary | ICD-10-CM | POA: Diagnosis not present

## 2019-07-09 DIAGNOSIS — I161 Hypertensive emergency: Secondary | ICD-10-CM | POA: Insufficient documentation

## 2019-07-09 DIAGNOSIS — R188 Other ascites: Secondary | ICD-10-CM

## 2019-07-09 DIAGNOSIS — N186 End stage renal disease: Secondary | ICD-10-CM | POA: Diagnosis not present

## 2019-07-09 DIAGNOSIS — Z992 Dependence on renal dialysis: Secondary | ICD-10-CM | POA: Insufficient documentation

## 2019-07-09 DIAGNOSIS — Z79899 Other long term (current) drug therapy: Secondary | ICD-10-CM | POA: Diagnosis not present

## 2019-07-09 DIAGNOSIS — Z9115 Patient's noncompliance with renal dialysis: Secondary | ICD-10-CM

## 2019-07-09 DIAGNOSIS — F17210 Nicotine dependence, cigarettes, uncomplicated: Secondary | ICD-10-CM | POA: Insufficient documentation

## 2019-07-09 DIAGNOSIS — J449 Chronic obstructive pulmonary disease, unspecified: Secondary | ICD-10-CM | POA: Diagnosis not present

## 2019-07-09 DIAGNOSIS — I120 Hypertensive chronic kidney disease with stage 5 chronic kidney disease or end stage renal disease: Secondary | ICD-10-CM | POA: Insufficient documentation

## 2019-07-09 DIAGNOSIS — I160 Hypertensive urgency: Secondary | ICD-10-CM

## 2019-07-09 DIAGNOSIS — I2510 Atherosclerotic heart disease of native coronary artery without angina pectoris: Secondary | ICD-10-CM | POA: Insufficient documentation

## 2019-07-09 DIAGNOSIS — I252 Old myocardial infarction: Secondary | ICD-10-CM | POA: Diagnosis not present

## 2019-07-09 DIAGNOSIS — Z5189 Encounter for other specified aftercare: Secondary | ICD-10-CM

## 2019-07-09 HISTORY — PX: IR PARACENTESIS: IMG2679

## 2019-07-09 LAB — CBC
HCT: 27.5 % — ABNORMAL LOW (ref 39.0–52.0)
Hemoglobin: 9.4 g/dL — ABNORMAL LOW (ref 13.0–17.0)
MCH: 27.1 pg (ref 26.0–34.0)
MCHC: 34.2 g/dL (ref 30.0–36.0)
MCV: 79.3 fL — ABNORMAL LOW (ref 80.0–100.0)
Platelets: 158 10*3/uL (ref 150–400)
RBC: 3.47 MIL/uL — ABNORMAL LOW (ref 4.22–5.81)
RDW: 15 % (ref 11.5–15.5)
WBC: 8 10*3/uL (ref 4.0–10.5)
nRBC: 0 % (ref 0.0–0.2)

## 2019-07-09 LAB — COMPREHENSIVE METABOLIC PANEL
ALT: 12 U/L (ref 0–44)
AST: 24 U/L (ref 15–41)
Albumin: 3.7 g/dL (ref 3.5–5.0)
Alkaline Phosphatase: 94 U/L (ref 38–126)
Anion gap: 16 — ABNORMAL HIGH (ref 5–15)
BUN: 31 mg/dL — ABNORMAL HIGH (ref 6–20)
CO2: 26 mmol/L (ref 22–32)
Calcium: 9.9 mg/dL (ref 8.9–10.3)
Chloride: 91 mmol/L — ABNORMAL LOW (ref 98–111)
Creatinine, Ser: 7.83 mg/dL — ABNORMAL HIGH (ref 0.61–1.24)
GFR calc Af Amer: 8 mL/min — ABNORMAL LOW (ref >60)
GFR calc non Af Amer: 7 mL/min — ABNORMAL LOW (ref >60)
Glucose, Bld: 136 mg/dL — ABNORMAL HIGH (ref 70–99)
Potassium: 5.6 mmol/L — ABNORMAL HIGH (ref 3.5–5.1)
Sodium: 133 mmol/L — ABNORMAL LOW (ref 135–145)
Total Bilirubin: 0.4 mg/dL (ref 0.3–1.2)
Total Protein: 6.7 g/dL (ref 6.5–8.1)

## 2019-07-09 LAB — PROTIME-INR
INR: 1.2 (ref 0.8–1.2)
Prothrombin Time: 14.8 seconds (ref 11.4–15.2)

## 2019-07-09 LAB — LIPASE, BLOOD: Lipase: 25 U/L (ref 11–51)

## 2019-07-09 MED ORDER — LIDOCAINE HCL (PF) 1 % IJ SOLN
INTRAMUSCULAR | Status: DC | PRN
  Administered 2019-07-09: 5 mL

## 2019-07-09 MED ORDER — LIDOCAINE HCL 1 % IJ SOLN
INTRAMUSCULAR | Status: AC
  Filled 2019-07-09: qty 20

## 2019-07-09 MED ORDER — SODIUM CHLORIDE 0.9% FLUSH: 3.0000 mL | Freq: Once | INTRAVENOUS | Status: DC

## 2019-07-09 MED ORDER — HYDRALAZINE HCL 25 MG PO TABS
50.0000 mg | ORAL_TABLET | Freq: Once | ORAL | Status: AC
  Administered 2019-07-09: 50 mg via ORAL
  Filled 2019-07-09: qty 2

## 2019-07-09 NOTE — Procedures (Signed)
PROCEDURE SUMMARY:  Successful US guided paracentesis from [facility].  Yielded 2.1 L of blood tinged fluid.  No immediate complications.  Pt tolerated well.   Specimen was not sent for labs.  EBL < 49mL  Ascencion Dike PA-C 07/09/2019 10:03 AM

## 2019-07-09 NOTE — ED Notes (Signed)
RN Vicente Males notified of pt's pressure.

## 2019-07-09 NOTE — ED Notes (Signed)
Patient transported to IR 

## 2019-07-09 NOTE — ED Provider Notes (Signed)
[facility] EMERGENCY DEPARTMENT Provider Note   CSN: [id number] Arrival date & time: 07/09/19  0446     History Chief Complaint  Patient presents with  . abdominal distention  . Abdominal Pain    [name] is a 58 y.o. male.  The history is provided by the patient and medical records. No language interpreter was used.  Abdominal Pain      58 year old male with history of liver cirrhosis secondary to hepatitis, abdominal ascites, end-stage renal disease currently on Monday Wednesday Friday dialysis, CAD, COPD, polysubstance abuse presented to ED requesting for paracentesis.  Patient states he feels that he needs paracentesis today even though he has one scheduled for tomorrow.  He also mention he did not go to his dialysis session today because he would like to have both done during this visit.  He report gradual onset of pressure sensation in his abdomen for the past 2 to 3weeks relating to fluid retention.  He does not complain of any fever or chills no worsening shortness of breath or increasing cough.  Denies any recent sick contact.  No report of any COVID-19 symptoms.  States he is currently taking antibiotic for SBP prophylaxis.  Past Medical History:  Diagnosis Date  . Anemia   . Anxiety   . Arthritis    left shoulder  . Atherosclerosis of aorta ([place])   . Cardiomegaly   . Chest pain    DATE UNKNOWN, C/O PERIODICALLY  . Cocaine abuse ([place])   . COPD exacerbation ([place]) 08/17/2016  . Coronary artery disease    stent 02/22/17  . ESRD (end stage renal disease) on dialysis ([place])    "[name]; MWF" (07/04/2017)  . GERD (gastroesophageal reflux disease)    DATE UNKNOWN  . Hemorrhoids   . Hepatitis B, chronic ([place])   . Hepatitis C   . History of kidney stones   . Hyperkalemia   . Hypertension   . Kidney failure   . Metabolic bone disease    Patient denies  . Mitral stenosis   . Myocardial infarction ([place])   . Pneumonia   . Pulmonary  edema   . Solitary rectal ulcer syndrome 07/2017   at [facility] for rectal bleeding  . Tubular adenoma of colon     Patient Active Problem List   Diagnosis Date Noted  . Dyspnea 06/09/2019  . Acquired thrombophilia ([place]) 06/05/2019  . A-fib ([place]) 05/30/2019  . Atrial fibrillation with RVR ([place]) 05/29/2019  . Melena   . Pressure injury of skin 03/09/2019  . Abdominal distention   . Volume overload 12/28/2018  . Sepsis ([place]) 09/12/2018  . Atherosclerosis of native coronary artery of native heart without angina pectoris 03/11/2018  . Benign neoplasm of cecum   . Benign neoplasm of ascending colon   . Benign neoplasm of descending colon   . Benign neoplasm of rectum   . Paroxysmal atrial fibrillation ([place]) 01/23/2018  . Hx of colonic polyps 01/20/2018  . ESRD (end stage renal disease) ([place]) 11/21/2017  . GERD (gastroesophageal reflux disease) 11/16/2017  . Liver cirrhosis ([place]) 11/15/2017  . DNR (do not resuscitate)   . Palliative care by specialist   . Hyponatremia 11/04/2017  . SBP (spontaneous bacterial peritonitis) ([place]) 10/30/2017  . Liver disease, chronic 10/30/2017  . SOB (shortness of breath)   . Abdominal pain 10/28/2017  . Upper airway cough syndrome with flattening on f/v loop 10/13/17 c/w vcd 10/17/2017  . Elevated diaphragm 10/13/2017  . Ileus (  [place]) 09/29/2017  . Malnutrition of moderate degree 09/29/2017  . Sinus congestion 09/03/2017  . Symptomatic anemia 09/02/2017  . Other cirrhosis of liver ([place]) 09/02/2017  . Left bundle branch block 09/02/2017  . Mitral stenosis 09/02/2017  . Hematochezia 07/15/2017  . Wide-complex tachycardia ([place])   . Endotracheally intubated   . ESRD on dialysis ([place]) 07/04/2017  . Acute respiratory failure with hypoxia ([place]) 06/18/2017  . CKD (chronic kidney disease) stage V requiring chronic dialysis ([place]) 06/18/2017  . History of Cocaine abuse ([place]) 06/18/2017  . Hypertension 06/18/2017  . Infection of AV graft for dialysis ([place])  06/18/2017  . Anxiety 06/18/2017  . Anemia due to chronic kidney disease 06/18/2017  . Atypical atrial flutter ([place]) 06/18/2017  . Personality disorder ([place]) 06/13/2017  . Cellulitis 06/12/2017  . Adjustment disorder with mixed anxiety and depressed mood 06/10/2017  . Suicidal ideation 06/10/2017  . Arm wound, left, sequela 06/10/2017  . Dyspnea on exertion 05/29/2017  . Tachycardia 05/29/2017  . Hyperkalemia 05/22/2017  . Acute metabolic encephalopathy   . Anemia 04/23/2017  . Ascites 04/23/2017  . COPD (chronic obstructive pulmonary disease) ([place]) 04/23/2017  . Acute on chronic respiratory failure with hypoxia ([place]) 03/25/2017  . Arrhythmia 03/25/2017  . COPD GOLD 0 with flattening on inps f/v  09/27/2016  . Essential hypertension 09/27/2016  . Fluid overload 08/30/2016  . COPD exacerbation ([place]) 08/17/2016  . Hypertensive urgency 08/17/2016  . Respiratory failure ([place]) 08/17/2016  . Problem with dialysis access ([place]) 07/23/2016  . Chronic hepatitis B ([place]) 03/05/2014  . Chronic hepatitis C without hepatic coma ([place]) 03/05/2014  . Internal hemorrhoids with bleeding, swelling and itching 03/05/2014  . Thrombocytopenia ([place]) 03/05/2014  . Chest pain 02/27/2014  . Alcohol abuse 04/14/2009  . Cigarette smoker 04/14/2009  . GANGLION CYST 04/14/2009    Past Surgical History:  Procedure Laterality Date  . A/V FISTULAGRAM Left 05/26/2017   Procedure: A/V FISTULAGRAM;  Surgeon: Conrad Hermleigh, MD;  Location: [facility];  Service: Cardiovascular;  Laterality: Left;  . A/V FISTULAGRAM Right 11/18/2017   Procedure: A/V FISTULAGRAM - Right Arm;  Surgeon: Elam Dutch, MD;  Location: [facility];  Service: Cardiovascular;  Laterality: Right;  . APPLICATION OF WOUND VAC Left 06/14/2017   Procedure: APPLICATION OF WOUND VAC;  Surgeon: Katha Cabal, MD;  Location: [facility];  Service: Vascular;  Laterality: Left;  . AV FISTULA PLACEMENT  2012   BELIEVED WAS PLACED  IN JUNE  . AV FISTULA PLACEMENT Right 08/09/2017   Procedure: Creation Right arm ARTERIOVENOUS BRACHIOCEPOHALIC FISTULA;  Surgeon: Elam Dutch, MD;  Location: [facility];  Service: Vascular;  Laterality: Right;  . AV FISTULA PLACEMENT Right 11/22/2017   Procedure: INSERTION OF ARTERIOVENOUS (AV) GORE-TEX GRAFT RIGHT UPPER ARM;  Surgeon: Elam Dutch, MD;  Location: [facility];  Service: Vascular;  Laterality: Right;  . BIOPSY  01/25/2018   Procedure: BIOPSY;  Surgeon: Jerene Bears, MD;  Location: [facility];  Service: Gastroenterology;;  . BIOPSY  04/10/2019   Procedure: BIOPSY;  Surgeon: Jerene Bears, MD;  Location: [facility];  Service: Gastroenterology;;  . COLONOSCOPY    . COLONOSCOPY WITH PROPOFOL N/A 01/25/2018   Procedure: COLONOSCOPY WITH PROPOFOL;  Surgeon: Jerene Bears, MD;  Location: [facility];  Service: Gastroenterology;  Laterality: N/A;  . CORONARY STENT INTERVENTION N/A 02/22/2017   Procedure: CORONARY STENT INTERVENTION;  Surgeon: Nigel Mormon, MD;  Location: [facility];  Service: Cardiovascular;  Laterality: N/A;  . ESOPHAGOGASTRODUODENOSCOPY (EGD) WITH PROPOFOL N/A 01/25/2018   Procedure: ESOPHAGOGASTRODUODENOSCOPY (EGD) WITH PROPOFOL;  Surgeon: Jerene Bears, MD;  Location: [facility];  Service: Gastroenterology;  Laterality: N/A;  . ESOPHAGOGASTRODUODENOSCOPY (EGD) WITH PROPOFOL N/A 04/10/2019   Procedure: ESOPHAGOGASTRODUODENOSCOPY (EGD) WITH PROPOFOL;  Surgeon: Jerene Bears, MD;  Location: [facility];  Service: Gastroenterology;  Laterality: N/A;  . FLEXIBLE SIGMOIDOSCOPY N/A 07/15/2017   Procedure: FLEXIBLE SIGMOIDOSCOPY;  Surgeon: Carol Ada, MD;  Location: [facility];  Service: Endoscopy;  Laterality: N/A;  . HEMORRHOID BANDING    . I & D EXTREMITY Left 06/01/2017   Procedure: IRRIGATION AND DEBRIDEMENT LEFT ARM HEMATOMA WITH LIGATION OF LEFT ARM AV FISTULA;  Surgeon: Elam Dutch, MD;  Location: [facility];  Service: Vascular;  Laterality:  Left;  . I & D EXTREMITY Left 06/14/2017   Procedure: IRRIGATION AND DEBRIDEMENT EXTREMITY;  Surgeon: Katha Cabal, MD;  Location: [facility];  Service: Vascular;  Laterality: Left;  . INSERTION OF DIALYSIS CATHETER  05/30/2017  . INSERTION OF DIALYSIS CATHETER N/A 05/30/2017   Procedure: INSERTION OF DIALYSIS CATHETER;  Surgeon: Elam Dutch, MD;  Location: [facility];  Service: Vascular;  Laterality: N/A;  . IR PARACENTESIS  08/30/2017  . IR PARACENTESIS  09/29/2017  . IR PARACENTESIS  10/28/2017  . IR PARACENTESIS  11/09/2017  . IR PARACENTESIS  11/16/2017  . IR PARACENTESIS  11/28/2017  . IR PARACENTESIS  12/01/2017  . IR PARACENTESIS  12/06/2017  . IR PARACENTESIS  01/03/2018  . IR PARACENTESIS  01/23/2018  . IR PARACENTESIS  02/07/2018  . IR PARACENTESIS  02/21/2018  . IR PARACENTESIS  03/06/2018  . IR PARACENTESIS  03/17/2018  . IR PARACENTESIS  04/04/2018  . IR PARACENTESIS  12/28/2018  . IR PARACENTESIS  01/08/2019  . IR PARACENTESIS  01/23/2019  . IR PARACENTESIS  02/01/2019  . IR PARACENTESIS  02/19/2019  . IR PARACENTESIS  03/01/2019  . IR PARACENTESIS  03/15/2019  . IR PARACENTESIS  04/03/2019  . IR PARACENTESIS  04/12/2019  . IR PARACENTESIS  05/01/2019  . IR PARACENTESIS  05/08/2019  . IR PARACENTESIS  05/24/2019  . IR PARACENTESIS  06/12/2019  . IR RADIOLOGIST EVAL & MGMT  02/14/2018  . IR RADIOLOGIST EVAL & MGMT  02/22/2019  . LEFT HEART CATH AND CORONARY ANGIOGRAPHY N/A 02/22/2017   Procedure: LEFT HEART CATH AND CORONARY ANGIOGRAPHY;  Surgeon: Nigel Mormon, MD;  Location: [facility];  Service: Cardiovascular;  Laterality: N/A;  . LIGATION OF ARTERIOVENOUS  FISTULA Left 09/17/298   Procedure: Plication of Left Arm Arteriovenous Fistula;  Surgeon: Elam Dutch, MD;  Location: [facility];  Service: Vascular;  Laterality: Left;  . POLYPECTOMY    . POLYPECTOMY  01/25/2018   Procedure: POLYPECTOMY;  Surgeon: Jerene Bears, MD;  Location: [facility];  Service:  Gastroenterology;;  . REVISON OF ARTERIOVENOUS FISTULA Left 04/03/3006   Procedure: PLICATION OF DISTAL ANEURYSMAL SEGEMENT OF LEFT UPPER ARM ARTERIOVENOUS FISTULA;  Surgeon: Elam Dutch, MD;  Location: [facility];  Service: Vascular;  Laterality: Left;  . REVISON OF ARTERIOVENOUS FISTULA Left 01/01/6332   Procedure: Plication of Left Upper Arm Fistula ;  Surgeon: Waynetta Sandy, MD;  Location: [facility];  Service: Vascular;  Laterality: Left;  . SKIN GRAFT SPLIT THICKNESS LEG / FOOT Left    SKIN GRAFT SPLIT THICKNESS LEFT ARM DONOR SITE: LEFT ANTERIOR THIGH  . SKIN SPLIT GRAFT Left 07/04/2017   Procedure: SKIN GRAFT  SPLIT THICKNESS LEFT ARM DONOR SITE: LEFT ANTERIOR THIGH;  Surgeon: Elam Dutch, MD;  Location: [facility];  Service: Vascular;  Laterality: Left;  . THROMBECTOMY W/ EMBOLECTOMY Left 06/05/2017   Procedure: EXPLORATION OF LEFT ARM FOR BLEEDING; OVERSEWED PROXIMAL FISTULA;  Surgeon: Angelia Mould, MD;  Location: [facility];  Service: Vascular;  Laterality: Left;  . WOUND EXPLORATION Left 06/03/2017   Procedure: WOUND EXPLORATION WITH WOUND VAC APPLICATION TO LEFT ARM;  Surgeon: Angelia Mould, MD;  Location: [facility];  Service: Vascular;  Laterality: Left;       Family History  Problem Relation Age of Onset  . Heart disease Mother   . Lung cancer Mother   . Heart disease Father   . Malignant hyperthermia Father   . COPD Father   . Throat cancer Sister   . Esophageal cancer Sister   . Hypertension Other   . COPD Other   . Colon cancer Neg Hx   . Colon polyps Neg Hx   . Rectal cancer Neg Hx   . Stomach cancer Neg Hx     Social History   Tobacco Use  . Smoking status: Current Every Day Smoker    Packs/day: 0.50    Years: 43.00    Pack years: 21.50    Types: Cigarettes    Start date: 08/13/1973  . Smokeless tobacco: Never Used  . Tobacco comment: i dont know i just make   Substance Use Topics  . Alcohol use: Not Currently    Comment: quit drinking in  2017  . Drug use: Not Currently    Types: Marijuana, Cocaine    Comment: reports using once every 3 months, 04-06-2019 was this     Home Medications Prior to Admission medications   Medication Sig Start Date End Date Taking? Authorizing Provider  albuterol (VENTOLIN HFA) 108 (90 Base) MCG/ACT inhaler Inhale 2 puffs into the lungs every 6 (six) hours as needed for wheezing or shortness of breath.    [provider]  hydrALAZINE (APRESOLINE) 100 MG tablet Take 0.5 tablets (50 mg total) by mouth 3 (three) times daily. Patient taking differently: Take 100 mg by mouth 3 (three) times daily.  05/31/19   [place], Carley D, DO  lactulose (CHRONULAC) 10 GM/15ML solution TAKE 45 MLS BY MOUTH 2 TIMES DAILY. Patient taking differently: Take 30 g by mouth 2 (two) times daily.  06/04/19   Al Decant, MD  loperamide (IMODIUM A-D) 2 MG tablet Take 2 mg by mouth 4 (four) times daily as needed for diarrhea or loose stools.    [provider]  metoprolol tartrate (LOPRESSOR) 100 MG tablet Take 1 tablet (100 mg total) by mouth 2 (two) times daily. 06/04/19   Al Decant, MD  montelukast (SINGULAIR) 10 MG tablet Take 10 mg by mouth every evening.     [provider]  oxyCODONE (OXY IR/ROXICODONE) 5 MG immediate release tablet Take 5 mg by mouth 2 (two) times daily as needed for moderate pain or severe pain.    [provider]  pantoprazole (PROTONIX) 40 MG tablet Take 1 tablet (40 mg total) by mouth daily. Patient taking differently: Take 40 mg by mouth See admin instructions. Take 40 mg by mouth in the morning before breakfast and an additional 40 mg once a day, if symptoms persist 06/05/19   Maudie Mercury, MD  sevelamer carbonate (RENVELA) 800 MG tablet Take 4 tablets with meals  And 2 tablets twice daily with snacks Patient taking differently: Take  1,600-2,400 mg by mouth See admin instructions. Take 2,400 mg by mouth three times a day with meals and 1,600 mg with  each snack 01/22/18   Patrecia Pour, MD  sulfamethoxazole-trimethoprim (BACTRIM DS) 800-160 MG tablet Take 1 tablet by mouth daily. Patient taking differently: Take 1 tablet by mouth 2 (two) times daily.  12/30/18   Geradine Girt, DO  SYMBICORT 160-4.5 MCG/ACT inhaler Inhale 2 puffs into the lungs 2 (two) times daily.  11/30/18   [provider]  zolpidem (AMBIEN) 10 MG tablet Take 10 mg by mouth at bedtime as needed for sleep.    [provider]    Allergies    Morphine and related, Aspirin, Clonidine derivatives, Tramadol, and Tylenol [acetaminophen]  Review of Systems   Review of Systems  Gastrointestinal: Positive for abdominal pain.  All other systems reviewed and are negative.   Physical Exam Updated Vital Signs BP (!) 195/83 (BP Location: Left Arm)   Pulse 63   Temp 98.2 F (36.8 C) (Oral)   Resp 18   Wt 65.4 kg   SpO2 98%   BMI 20.69 kg/m   Physical Exam Vitals and nursing note reviewed.  Constitutional:      General: He is not in acute distress.    Appearance: He is well-developed.  HENT:     Head: Atraumatic.  Eyes:     Conjunctiva/sclera: Conjunctivae normal.  Cardiovascular:     Rate and Rhythm: Normal rate and regular rhythm.  Pulmonary:     Effort: Pulmonary effort is normal.     Breath sounds: Normal breath sounds.  Abdominal:     General: There is distension.     Palpations: There is shifting dullness.     Tenderness: There is abdominal tenderness (Mild diffuse abdominal tenderness without guarding or rebound tenderness.).  Musculoskeletal:        General: No swelling.     Cervical back: Neck supple.  Skin:    Findings: No rash.  Neurological:     Mental Status: He is alert.  Psychiatric:        Mood and Affect: Mood normal.     ED Results / Procedures / Treatments   Labs (all labs ordered are listed, but only abnormal results are displayed) Labs Reviewed  COMPREHENSIVE METABOLIC PANEL - Abnormal; Notable for the following  components:      Result Value   Sodium 133 (*)    Potassium 5.6 (*)    Chloride 91 (*)    Glucose, Bld 136 (*)    BUN 31 (*)    Creatinine, Ser 7.83 (*)    GFR calc non Af Amer 7 (*)    GFR calc Af Amer 8 (*)    Anion gap 16 (*)    All other components within normal limits  CBC - Abnormal; Notable for the following components:   RBC 3.47 (*)    Hemoglobin 9.4 (*)    HCT 27.5 (*)    MCV 79.3 (*)    All other components within normal limits  LIPASE, BLOOD  PROTIME-INR  URINALYSIS, ROUTINE W REFLEX MICROSCOPIC    EKG EKG Interpretation  Date/Time:  Monday July 09 2019 07:42:07 EST Ventricular Rate:  67 PR Interval:    QRS Duration: 176 QT Interval:  524 QTC Calculation: 554 R Axis:   -108 Text Interpretation: Sinus rhythm Prolonged PR interval Consider left atrial enlargement Nonspecific IVCD with LAD Consider left ventricular hypertrophy No significant change since last tracing Confirmed by  Pattricia Boss [phone]) on 07/09/2019 7:45:11 AM   Radiology No results found.  Procedures Procedures (including critical care time)  Medications Ordered in ED Medications  sodium chloride flush (NS) 0.9 % injection 3 mL (3 mLs Intravenous Not Given 07/09/19 0933)  lidocaine (XYLOCAINE) 1 % (with pres) injection (has no administration in time range)  lidocaine (PF) (XYLOCAINE) 1 % injection (5 mLs  Given 07/09/19 0957)  hydrALAZINE (APRESOLINE) tablet 50 mg (50 mg Oral Given 07/09/19 6378)    ED Course  I have reviewed the triage vital signs and the nursing notes.  Pertinent labs & imaging results that were available during my care of the patient were reviewed by me and considered in my medical decision making (see chart for details).    MDM Rules/Calculators/A&P                      BP (!) 201/94 (BP Location: Left Arm)   Pulse 63   Temp 98.1 F (36.7 C) (Oral)   Resp 17   Wt 65.4 kg   SpO2 99%   BMI 20.69 kg/m   Final Clinical Impression(s) / ED  Diagnoses Final diagnoses:  Therapeutic  Other ascites  Dialysis patient, noncompliant ([facility])  Hypertensive urgency    Rx / DC Orders ED Discharge Orders    None     7:04 AM Patient normally has large volume paracentesis every 3 weeks presenting requesting to have paracentesis.  Does have a schedule paracentesis tomorrow but states he cannot wait.  Also is scheduled to have his dialysis session today but prefers to have it done in the hospital as well for this visit.  Denies worsening abdominal pain or fever to suggest underlying SBP. He is currently at baseline mentation.  Appears to be in no acute discomfort.  Labs remarkable for potassium of 5.6, likely benefiting from dialysis today.  8:45 AM I have consulted IR radiologist Dr. Vernard Gambles who agrees to have pt undergo therapeutic paracentesis today.  I have also consulted nephrologist Dr. Melvia Heaps who will help coordinate dialysis session for pt either today or tomorrow. Care discussed with Dr. Jeanell Sparrow.  Pt found to be hypertensive with BP 201/94.  No additional sxs suggestive of hypertensive emergency.  Will give his daily BP medication Hydralazine 50mg  PO.  PT currently resting comfortably, in no acute distress.   10:31 AM Nephrology PA has seen and evaluated patient.  Patient can follow-up outpatient for his dialysis session either today or tomorrow after he receives his therapeutic paracentesis. Pt did received his therapeutic paracentesis and felt better.  He is stable for discharge.    Domenic Moras, PA-C 07/09/19 1043    Pattricia Boss, MD 07/10/19 [phone]

## 2019-07-09 NOTE — ED Triage Notes (Signed)
Pt in with abdominal distention and pain. States hx of liver disease, and was scheduled for paracentesis tomorrow, but "cannot wait any longer".  C/o some sob, sats 97% on RA, no sick contacts. Is MWF dialysis pt, last session Saturday

## 2019-07-09 NOTE — Discharge Instructions (Addendum)
Please get dialyze today or tomorrow.  Follow up with your doctor for further care.

## 2019-07-10 ENCOUNTER — Ambulatory Visit (HOSPITAL_COMMUNITY): Payer: Medicare Other

## 2019-07-14 ENCOUNTER — Emergency Department (HOSPITAL_COMMUNITY)
Admission: EM | Admit: 2019-07-14 | Discharge: 2019-07-14 | Disposition: A | Discharge status: Home / Self Care | Payer: Medicare Other | Source: Home / Self Care | Attending: Emergency Medicine | Admitting: Emergency Medicine

## 2019-07-14 ENCOUNTER — Other Ambulatory Visit: Payer: Self-pay

## 2019-07-14 ENCOUNTER — Encounter (HOSPITAL_COMMUNITY): Payer: Self-pay | Admitting: Emergency Medicine

## 2019-07-14 DIAGNOSIS — E875 Hyperkalemia: Secondary | ICD-10-CM

## 2019-07-14 LAB — COMPREHENSIVE METABOLIC PANEL
ALT: 14 U/L (ref 0–44)
AST: 26 U/L (ref 15–41)
Albumin: 3.5 g/dL (ref 3.5–5.0)
Alkaline Phosphatase: 94 U/L (ref 38–126)
Anion gap: 17 — ABNORMAL HIGH (ref 5–15)
BUN: 44 mg/dL — ABNORMAL HIGH (ref 6–20)
CO2: 24 mmol/L (ref 22–32)
Calcium: 9.9 mg/dL (ref 8.9–10.3)
Chloride: 93 mmol/L — ABNORMAL LOW (ref 98–111)
Creatinine, Ser: 9.63 mg/dL — ABNORMAL HIGH (ref 0.61–1.24)
GFR calc Af Amer: 6 mL/min — ABNORMAL LOW (ref >60)
GFR calc non Af Amer: 5 mL/min — ABNORMAL LOW (ref >60)
Glucose, Bld: 141 mg/dL — ABNORMAL HIGH (ref 70–99)
Potassium: 6.3 mmol/L (ref 3.5–5.1)
Sodium: 134 mmol/L — ABNORMAL LOW (ref 135–145)
Total Bilirubin: 0.6 mg/dL (ref 0.3–1.2)
Total Protein: 6.7 g/dL (ref 6.5–8.1)

## 2019-07-14 LAB — CBC
HCT: 28 % — ABNORMAL LOW (ref 39.0–52.0)
Hemoglobin: 9.8 g/dL — ABNORMAL LOW (ref 13.0–17.0)
MCH: 27.5 pg (ref 26.0–34.0)
MCHC: 35 g/dL (ref 30.0–36.0)
MCV: 78.4 fL — ABNORMAL LOW (ref 80.0–100.0)
Platelets: 175 10*3/uL (ref 150–400)
RBC: 3.57 MIL/uL — ABNORMAL LOW (ref 4.22–5.81)
RDW: 14.6 % (ref 11.5–15.5)
WBC: 9.3 10*3/uL (ref 4.0–10.5)
nRBC: 0 % (ref 0.0–0.2)

## 2019-07-14 LAB — CBG MONITORING, ED
Glucose-Capillary: 33 mg/dL — CL (ref 70–99)
Glucose-Capillary: 63 mg/dL — ABNORMAL LOW (ref 70–99)

## 2019-07-14 LAB — RESPIRATORY PANEL BY RT PCR (FLU A&B, COVID)
Influenza A by PCR: NEGATIVE
Influenza B by PCR: NEGATIVE
SARS Coronavirus 2 by RT PCR: NEGATIVE

## 2019-07-14 LAB — LIPASE, BLOOD: Lipase: 25 U/L (ref 11–51)

## 2019-07-14 MED ORDER — MORPHINE SULFATE (PF) 4 MG/ML IV SOLN
4.0000 mg | Freq: Once | INTRAVENOUS | Status: AC
  Administered 2019-07-14: 19:00:00 4 mg via INTRAVENOUS
  Filled 2019-07-14: qty 1

## 2019-07-14 MED ORDER — LABETALOL HCL 5 MG/ML IV SOLN
20.0000 mg | Freq: Once | INTRAVENOUS | Status: AC
  Administered 2019-07-14: 19:00:00 20 mg via INTRAVENOUS
  Filled 2019-07-14: qty 4

## 2019-07-14 MED ORDER — DEXTROSE 50 % IV SOLN
INTRAVENOUS | Status: AC
  Administered 2019-07-14: 20:00:00 50 mL via INTRAVENOUS
  Filled 2019-07-14: qty 50

## 2019-07-14 MED ORDER — DEXTROSE 50 % IV SOLN: 1.0000 | Freq: Once | INTRAVENOUS | Status: AC

## 2019-07-14 MED ORDER — DEXTROSE 50 % IV SOLN
1.0000 | Freq: Once | INTRAVENOUS | Status: AC
  Administered 2019-07-14: 50 mL via INTRAVENOUS
  Filled 2019-07-14: qty 50

## 2019-07-14 MED ORDER — INSULIN ASPART 100 UNIT/ML IV SOLN
5.0000 [IU] | Freq: Once | INTRAVENOUS | Status: AC
  Administered 2019-07-14: 5 [IU] via INTRAVENOUS

## 2019-07-14 NOTE — ED Notes (Signed)
Unable to locate pt in waiting area for EKG.  Called number and he states he left.  Informed pt that he needed an EKG and he states that he will return.

## 2019-07-14 NOTE — ED Triage Notes (Signed)
Pt endorses abd pain for a week. States he had paracentesis recently but did not help. Last HD Wednesday.

## 2019-07-14 NOTE — Progress Notes (Signed)
Asked to see pt for hyperkalemia and possible dialysis. Did exam, no sig vol overload, ascites not as bad as usual, pt is calm in no distress. Pt refusing dialysis, says "i'll go Monday".  Warned him about risks.  Would give him K+ medication (kayexalate or Lokelma) to take tomorrow 1 or 2 times. Please call w/ any questions.   [name], MD 07/14/2019, 8:58 PM

## 2019-07-14 NOTE — ED Notes (Signed)
Pt. Insisted to leave AMA. Provider at bedside. Risk and benefits discussed. Pt. States, "I still want to leave".

## 2019-07-14 NOTE — ED Provider Notes (Signed)
[facility] EMERGENCY DEPARTMENT Provider Note   CSN: [id number] Arrival date & time: 07/14/19  1527     History Chief Complaint  Patient presents with  . Abdominal Pain    [name] is a 59 y.o. male.  HPI [name] is a 59 y.o. male with a medical history of hepatitis b/c, mi, copd, esrd hd mwf who presents to the ED for missing dialysis and abdominal discomfort. He did not go to dialysis today, last dialyzed Wednesday. He came here for abdominal discomfort and feeling fluid overloaded. Reports has about 1 therapeutic paracentesis every other week, last performed last week. Is on bactrim for sbp ppx and states has been compliant. He denies constitutional symptoms, shortness of breath, fever, vomiting. 1 episode of diarrhea.      Past Medical History:  Diagnosis Date  . Anemia   . Anxiety   . Arthritis    left shoulder  . Atherosclerosis of aorta ([place])   . Cardiomegaly   . Chest pain    DATE UNKNOWN, C/O PERIODICALLY  . Cocaine abuse ([place])   . COPD exacerbation ([place]) 08/17/2016  . Coronary artery disease    stent 02/22/17  . ESRD (end stage renal disease) on dialysis ([place])    "[name]; MWF" (07/04/2017)  . GERD (gastroesophageal reflux disease)    DATE UNKNOWN  . Hemorrhoids   . Hepatitis B, chronic ([place])   . Hepatitis C   . History of kidney stones   . Hyperkalemia   . Hypertension   . Kidney failure   . Metabolic bone disease    Patient denies  . Mitral stenosis   . Myocardial infarction ([place])   . Pneumonia   . Pulmonary edema   . Solitary rectal ulcer syndrome 07/2017   at [facility] for rectal bleeding  . Tubular adenoma of colon     Patient Active Problem List   Diagnosis Date Noted  . Dyspnea 06/09/2019  . Acquired thrombophilia ([place]) 06/05/2019  . A-fib ([place]) 05/30/2019  . Atrial fibrillation with RVR ([place]) 05/29/2019  . Melena   . Pressure injury of skin 03/09/2019  . Abdominal distention   . Volume  overload 12/28/2018  . Sepsis ([place]) 09/12/2018  . Atherosclerosis of native coronary artery of native heart without angina pectoris 03/11/2018  . Benign neoplasm of cecum   . Benign neoplasm of ascending colon   . Benign neoplasm of descending colon   . Benign neoplasm of rectum   . Paroxysmal atrial fibrillation ([place]) 01/23/2018  . Hx of colonic polyps 01/20/2018  . ESRD (end stage renal disease) ([place]) 11/21/2017  . GERD (gastroesophageal reflux disease) 11/16/2017  . Liver cirrhosis ([place]) 11/15/2017  . DNR (do not resuscitate)   . Palliative care by specialist   . Hyponatremia 11/04/2017  . SBP (spontaneous bacterial peritonitis) ([place]) 10/30/2017  . Liver disease, chronic 10/30/2017  . SOB (shortness of breath)   . Abdominal pain 10/28/2017  . Upper airway cough syndrome with flattening on f/v loop 10/13/17 c/w vcd 10/17/2017  . Elevated diaphragm 10/13/2017  . Ileus ([place]) 09/29/2017  . Malnutrition of moderate degree 09/29/2017  . Sinus congestion 09/03/2017  . Symptomatic anemia 09/02/2017  . Other cirrhosis of liver ([place]) 09/02/2017  . Left bundle branch block 09/02/2017  . Mitral stenosis 09/02/2017  . Hematochezia 07/15/2017  . Wide-complex tachycardia ([place])   . Endotracheally intubated   . ESRD on dialysis ([place]) 07/04/2017  . Acute respiratory failure with hypoxia ([place])  06/18/2017  . CKD (chronic kidney disease) stage V requiring chronic dialysis ([place]) 06/18/2017  . History of Cocaine abuse ([place]) 06/18/2017  . Hypertension 06/18/2017  . Infection of AV graft for dialysis ([place]) 06/18/2017  . Anxiety 06/18/2017  . Anemia due to chronic kidney disease 06/18/2017  . Atypical atrial flutter ([place]) 06/18/2017  . Personality disorder ([place]) 06/13/2017  . Cellulitis 06/12/2017  . Adjustment disorder with mixed anxiety and depressed mood 06/10/2017  . Suicidal ideation 06/10/2017  . Arm wound, left, sequela 06/10/2017  . Dyspnea on exertion 05/29/2017  . Tachycardia  05/29/2017  . Hyperkalemia 05/22/2017  . Acute metabolic encephalopathy   . Anemia 04/23/2017  . Ascites 04/23/2017  . COPD (chronic obstructive pulmonary disease) ([place]) 04/23/2017  . Acute on chronic respiratory failure with hypoxia ([place]) 03/25/2017  . Arrhythmia 03/25/2017  . COPD GOLD 0 with flattening on inps f/v  09/27/2016  . Essential hypertension 09/27/2016  . Fluid overload 08/30/2016  . COPD exacerbation ([place]) 08/17/2016  . Hypertensive urgency 08/17/2016  . Respiratory failure ([place]) 08/17/2016  . Problem with dialysis access ([place]) 07/23/2016  . Chronic hepatitis B ([place]) 03/05/2014  . Chronic hepatitis C without hepatic coma ([place]) 03/05/2014  . Internal hemorrhoids with bleeding, swelling and itching 03/05/2014  . Thrombocytopenia ([place]) 03/05/2014  . Chest pain 02/27/2014  . Alcohol abuse 04/14/2009  . Cigarette smoker 04/14/2009  . GANGLION CYST 04/14/2009    Past Surgical History:  Procedure Laterality Date  . A/V FISTULAGRAM Left 05/26/2017   Procedure: A/V FISTULAGRAM;  Surgeon: Conrad Laurel, MD;  Location: [facility];  Service: Cardiovascular;  Laterality: Left;  . A/V FISTULAGRAM Right 11/18/2017   Procedure: A/V FISTULAGRAM - Right Arm;  Surgeon: Elam Dutch, MD;  Location: [facility];  Service: Cardiovascular;  Laterality: Right;  . APPLICATION OF WOUND VAC Left 06/14/2017   Procedure: APPLICATION OF WOUND VAC;  Surgeon: Katha Cabal, MD;  Location: [facility];  Service: Vascular;  Laterality: Left;  . AV FISTULA PLACEMENT  2012   BELIEVED WAS PLACED IN JUNE  . AV FISTULA PLACEMENT Right 08/09/2017   Procedure: Creation Right arm ARTERIOVENOUS BRACHIOCEPOHALIC FISTULA;  Surgeon: Elam Dutch, MD;  Location: [facility];  Service: Vascular;  Laterality: Right;  . AV FISTULA PLACEMENT Right 11/22/2017   Procedure: INSERTION OF ARTERIOVENOUS (AV) GORE-TEX GRAFT RIGHT UPPER ARM;  Surgeon: Elam Dutch, MD;  Location: [facility];  Service:  Vascular;  Laterality: Right;  . BIOPSY  01/25/2018   Procedure: BIOPSY;  Surgeon: Jerene Bears, MD;  Location: [facility];  Service: Gastroenterology;;  . BIOPSY  04/10/2019   Procedure: BIOPSY;  Surgeon: Jerene Bears, MD;  Location: [facility];  Service: Gastroenterology;;  . COLONOSCOPY    . COLONOSCOPY WITH PROPOFOL N/A 01/25/2018   Procedure: COLONOSCOPY WITH PROPOFOL;  Surgeon: Jerene Bears, MD;  Location: [facility];  Service: Gastroenterology;  Laterality: N/A;  . CORONARY STENT INTERVENTION N/A 02/22/2017   Procedure: CORONARY STENT INTERVENTION;  Surgeon: Nigel Mormon, MD;  Location: [facility];  Service: Cardiovascular;  Laterality: N/A;  . ESOPHAGOGASTRODUODENOSCOPY (EGD) WITH PROPOFOL N/A 01/25/2018   Procedure: ESOPHAGOGASTRODUODENOSCOPY (EGD) WITH PROPOFOL;  Surgeon: Jerene Bears, MD;  Location: [facility];  Service: Gastroenterology;  Laterality: N/A;  . ESOPHAGOGASTRODUODENOSCOPY (EGD) WITH PROPOFOL N/A 04/10/2019   Procedure: ESOPHAGOGASTRODUODENOSCOPY (EGD) WITH PROPOFOL;  Surgeon: Jerene Bears, MD;  [facility];  Service: Gastroenterology;  Laterality: N/A;  . FLEXIBLE SIGMOIDOSCOPY  N/A 07/15/2017   Procedure: FLEXIBLE SIGMOIDOSCOPY;  Surgeon: Carol Ada, MD;  Location: [facility];  Service: Endoscopy;  Laterality: N/A;  . HEMORRHOID BANDING    . I & D EXTREMITY Left 06/01/2017   Procedure: IRRIGATION AND DEBRIDEMENT LEFT ARM HEMATOMA WITH LIGATION OF LEFT ARM AV FISTULA;  Surgeon: Elam Dutch, MD;  Location: [facility];  Service: Vascular;  Laterality: Left;  . I & D EXTREMITY Left 06/14/2017   Procedure: IRRIGATION AND DEBRIDEMENT EXTREMITY;  Surgeon: Katha Cabal, MD;  Location: [facility];  Service: Vascular;  Laterality: Left;  . INSERTION OF DIALYSIS CATHETER  05/30/2017  . INSERTION OF DIALYSIS CATHETER N/A 05/30/2017   Procedure: INSERTION OF DIALYSIS CATHETER;  Surgeon: Elam Dutch, MD;  Location: [facility];  Service:  Vascular;  Laterality: N/A;  . IR PARACENTESIS  08/30/2017  . IR PARACENTESIS  09/29/2017  . IR PARACENTESIS  10/28/2017  . IR PARACENTESIS  11/09/2017  . IR PARACENTESIS  11/16/2017  . IR PARACENTESIS  11/28/2017  . IR PARACENTESIS  12/01/2017  . IR PARACENTESIS  12/06/2017  . IR PARACENTESIS  01/03/2018  . IR PARACENTESIS  01/23/2018  . IR PARACENTESIS  02/07/2018  . IR PARACENTESIS  02/21/2018  . IR PARACENTESIS  03/06/2018  . IR PARACENTESIS  03/17/2018  . IR PARACENTESIS  04/04/2018  . IR PARACENTESIS  12/28/2018  . IR PARACENTESIS  01/08/2019  . IR PARACENTESIS  01/23/2019  . IR PARACENTESIS  02/01/2019  . IR PARACENTESIS  02/19/2019  . IR PARACENTESIS  03/01/2019  . IR PARACENTESIS  03/15/2019  . IR PARACENTESIS  04/03/2019  . IR PARACENTESIS  04/12/2019  . IR PARACENTESIS  05/01/2019  . IR PARACENTESIS  05/08/2019  . IR PARACENTESIS  05/24/2019  . IR PARACENTESIS  06/12/2019  . IR PARACENTESIS  07/09/2019  . IR RADIOLOGIST EVAL & MGMT  02/14/2018  . IR RADIOLOGIST EVAL & MGMT  02/22/2019  . LEFT HEART CATH AND CORONARY ANGIOGRAPHY N/A 02/22/2017   Procedure: LEFT HEART CATH AND CORONARY ANGIOGRAPHY;  Surgeon: Nigel Mormon, MD;  Location: [facility];  Service: Cardiovascular;  Laterality: N/A;  . LIGATION OF ARTERIOVENOUS  FISTULA Left 08/20/9240   Procedure: Plication of Left Arm Arteriovenous Fistula;  Surgeon: Elam Dutch, MD;  Location: [facility];  Service: Vascular;  Laterality: Left;  . POLYPECTOMY    . POLYPECTOMY  01/25/2018   Procedure: POLYPECTOMY;  Surgeon: Jerene Bears, MD;  Location: [place];  Service: Gastroenterology;;  . REVISON OF ARTERIOVENOUS FISTULA Left 6/83/4196   Procedure: PLICATION OF DISTAL ANEURYSMAL SEGEMENT OF LEFT UPPER ARM ARTERIOVENOUS FISTULA;  Surgeon: Elam Dutch, MD;  Location: [facility];  Service: Vascular;  Laterality: Left;  . REVISON OF ARTERIOVENOUS FISTULA Left 09/02/9796   Procedure: Plication of Left Upper Arm Fistula ;  Surgeon: Waynetta Sandy, MD;  Location: [facility];  Service: Vascular;  Laterality: Left;  . SKIN GRAFT SPLIT THICKNESS LEG / FOOT Left    SKIN GRAFT SPLIT THICKNESS LEFT ARM DONOR SITE: LEFT ANTERIOR THIGH  . SKIN SPLIT GRAFT Left 07/04/2017   Procedure: SKIN GRAFT SPLIT THICKNESS LEFT ARM DONOR SITE: LEFT ANTERIOR THIGH;  Surgeon: Elam Dutch, MD;  Location: [facility];  Service: Vascular;  Laterality: Left;  . THROMBECTOMY W/ EMBOLECTOMY Left 06/05/2017   Procedure: EXPLORATION OF LEFT ARM FOR BLEEDING; OVERSEWED PROXIMAL FISTULA;  Surgeon: Angelia Mould, MD;  Location: [facility];  Service: Vascular;  Laterality: Left;  .  WOUND EXPLORATION Left 06/03/2017   Procedure: WOUND EXPLORATION WITH WOUND VAC APPLICATION TO LEFT ARM;  Surgeon: Angelia Mould, MD;  Location: [facility];  Service: Vascular;  Laterality: Left;       Family History  Problem Relation Age of Onset  . Heart disease Mother   . Lung cancer Mother   . Heart disease Father   . Malignant hyperthermia Father   . COPD Father   . Throat cancer Sister   . Esophageal cancer Sister   . Hypertension Other   . COPD Other   . Colon cancer Neg Hx   . Colon polyps Neg Hx   . Rectal cancer Neg Hx   . Stomach cancer Neg Hx     Social History   Tobacco Use  . Smoking status: Current Every [place] Smoker    Packs/[place] Smart: 0.50    Years: 43.00    Pack years: 21.50    Types: Cigarettes    Start date: 08/13/1973  . Smokeless tobacco: Never Used  . Tobacco comment: i dont know i just make   Substance Use Topics  . Alcohol use: Not Currently    Comment: quit drinking in 2017  . Drug use: Not Currently    Types: Marijuana, Cocaine    Comment: reports using once every 3 months, 04-06-2019 was this     Home Medications Prior to Admission medications   Medication Sig Start Date End Date Taking? Authorizing Provider  albuterol (VENTOLIN HFA) 108 (90 Base) MCG/ACT inhaler Inhale 2 puffs into the lungs every 6 (six) hours as needed for  wheezing or shortness of breath.    [provider]  gabapentin (NEURONTIN) 100 MG capsule Take 100 mg by mouth 2 (two) times daily. 07/03/19   [provider]  hydrALAZINE (APRESOLINE) 100 MG tablet Take 0.5 tablets (50 mg total) by mouth 3 (three) times daily. 05/31/19   [place], Carley D, DO  lactulose (CHRONULAC) 10 GM/15ML solution TAKE 45 MLS BY MOUTH 2 TIMES DAILY. Patient taking differently: Take 30 g by mouth 2 (two) times daily.  06/04/19   Al Decant, MD  loperamide (IMODIUM A-D) 2 MG tablet Take 2 mg by mouth 4 (four) times daily as needed for diarrhea or loose stools.    [provider]  metoprolol tartrate (LOPRESSOR) 100 MG tablet Take 1 tablet (100 mg total) by mouth 2 (two) times daily. 06/04/19   Al Decant, MD  montelukast (SINGULAIR) 10 MG tablet Take 10 mg by mouth every evening.     [provider]  ondansetron (ZOFRAN) 8 MG tablet Take 8 mg by mouth every 4 (four) hours as needed. 06/14/19   [provider]  pantoprazole (PROTONIX) 40 MG tablet Take 1 tablet (40 mg total) by mouth daily. Patient taking differently: Take 40 mg by mouth See admin instructions. Take 40 mg by mouth in the morning before breakfast and an additional 40 mg once a [name], if symptoms persist 06/05/19   Maudie Mercury, MD  sevelamer carbonate (RENVELA) 800 MG tablet Take 4 tablets with meals  And 2 tablets twice daily with snacks Patient taking differently: Take 1,600-2,400 mg by mouth See admin instructions. Take 2,400 mg by mouth three times a [name] with meals and 1,600 mg with each snack 01/22/18   Patrecia Pour, MD  sulfamethoxazole-trimethoprim (BACTRIM DS) 800-160 MG tablet Take 1 tablet by mouth daily. Patient taking differently: Take 1 tablet by mouth 2 (two) times daily.  12/30/18   Geradine Girt,  DO  SYMBICORT 160-4.5 MCG/ACT inhaler Inhale 2 puffs into the lungs 2 (two) times daily.  11/30/18   [provider]  zolpidem (AMBIEN) 10 MG  tablet Take 10 mg by mouth at bedtime as needed for sleep.    [provider]    Allergies    Morphine and related, Aspirin, Clonidine derivatives, Tramadol, and Tylenol [acetaminophen]  Review of Systems   Review of Systems  Constitutional: Negative for chills and fever.  HENT: Negative for congestion, ear pain and sore throat.   Eyes: Negative for pain, discharge, redness and visual disturbance.  Respiratory: Negative for cough and shortness of breath.   Cardiovascular: Negative for chest pain and palpitations.  Gastrointestinal: Positive for abdominal pain. Negative for nausea and vomiting.  Genitourinary: Negative for dysuria, frequency and hematuria.  Musculoskeletal: Negative for arthralgias, back pain and neck stiffness.  Skin: Negative for color change and rash.  Neurological: Negative for seizures, syncope and weakness.  Psychiatric/Behavioral: Negative for agitation.  All other systems reviewed and are negative.   Physical Exam Updated Vital Signs BP (!) 204/94   Pulse 63   Temp (P) 97.9 F (36.6 C) (Oral)   Resp 19   SpO2 99%   Physical Exam Vitals and nursing note reviewed.  Constitutional:      General: He is in acute distress.     Appearance: Normal appearance. He is well-developed. He is not ill-appearing.     Comments: Appears older than stated age, chronically ill, in mild acute distress, no respiratory distress  HENT:     Head: Normocephalic and atraumatic.     Right Ear: External ear normal.     Left Ear: External ear normal.     Nose: Nose normal. No congestion.     Mouth/Throat:     Mouth: Mucous membranes are moist.  Eyes:     General:        Right eye: No discharge.        Left eye: No discharge.     Conjunctiva/sclera: Conjunctivae normal.  Cardiovascular:     Rate and Rhythm: Normal rate and regular rhythm.     Pulses: Normal pulses.     Heart sounds: Normal heart sounds. No murmur.  Pulmonary:     Effort: Pulmonary effort is  normal. No respiratory distress.     Breath sounds: Normal breath sounds. No wheezing or rales.  Abdominal:     General: Abdomen is flat. There is no distension.     Palpations: Abdomen is soft. There is no mass.     Tenderness: There is no abdominal tenderness. There is no guarding or rebound.     Hernia: No hernia is present.     Comments: His abdomen is soft and non tender with no guarding or rebound  Musculoskeletal:        General: No signs of injury. Normal range of motion.     Cervical back: Normal range of motion and neck supple.  Skin:    General: Skin is warm and dry.     Capillary Refill: Capillary refill takes less than 2 seconds.  Neurological:     General: No focal deficit present.     Mental Status: He is alert. Mental status is at baseline.  Psychiatric:        Mood and Affect: Mood normal.        Behavior: Behavior normal.     ED Results / Procedures / Treatments   Labs (all labs ordered are listed,  but only abnormal results are displayed) Labs Reviewed  COMPREHENSIVE METABOLIC PANEL - Abnormal; Notable for the following components:      Result Value   Sodium 134 (*)    Potassium 6.3 (*)    Chloride 93 (*)    Glucose, Bld 141 (*)    BUN 44 (*)    Creatinine, Ser 9.63 (*)    GFR calc non Af Amer 5 (*)    GFR calc Af Amer 6 (*)    Anion gap 17 (*)    All other components within normal limits  CBC - Abnormal; Notable for the following components:   RBC 3.57 (*)    Hemoglobin 9.8 (*)    HCT 28.0 (*)    MCV 78.4 (*)    All other components within normal limits  CBG MONITORING, ED - Abnormal; Notable for the following components:   Glucose-Capillary 33 (*)    All other components within normal limits  CBG MONITORING, ED - Abnormal; Notable for the following components:   Glucose-Capillary 63 (*)    All other components within normal limits  RESPIRATORY PANEL BY RT PCR (FLU A&B, COVID)  LIPASE, BLOOD    EKG EKG Interpretation  Date/Time:  Saturday  July 14 2019 17:22:53 EST Ventricular Rate:  66 PR Interval:  216 QRS Duration: 166 QT Interval:  522 QTC Calculation: 547 R Axis:   -174 Text Interpretation: Sinus rhythm with 1st degree A-V block Right superior axis deviation Left bundle branch block Abnormal ECG no acute changes Confirmed by Madalyn Rob [phone]) on 07/14/2019 5:32:00 PM   Radiology No results found.  Procedures Procedures (including critical care time)  Medications Ordered in ED Medications  insulin aspart (novoLOG) injection 5 Units (5 Units Intravenous Given 07/14/19 1904)    And  dextrose 50 % solution 50 mL (50 mLs Intravenous Given 07/14/19 1858)  morphine 4 MG/ML injection 4 mg (4 mg Intravenous Given 07/14/19 1905)  labetalol (NORMODYNE) injection 20 mg (20 mg Intravenous Given 07/14/19 1901)  dextrose 50 % solution 50 mL (50 mLs Intravenous Given 07/14/19 2014)    ED Course  I have reviewed the triage vital signs and the nursing notes.  Pertinent labs & imaging results that were available during my care of the patient were reviewed by me and considered in my medical decision making (see chart for details).    MDM Rules/Calculators/A&P                      [name] is a 59 y.o. male with a medical history of hepatitis b/c, mi, copd, esrd hd mwf who presents to the ED for missing dialysis and abdominal discomfort. Last dialyzed Wednesday. He feels fluid overloaded. Reports has about 1 therapeutic paracentesis every other week, last performed last week. Is on bactrim for sbp ppx and states has been compliant. He denies constitutional symptoms, shortness of breath, fever, vomiting.   HPI and physical exam as above. Appears older than stated age, chronically ill, in mild acute distress, no respiratory distress, awake, alert, hemodynamically stable, afebrile, non toxic. His abdomen is soft and non tender with no guarding or rebound.  We temporized his potassium of 6.3 with insulin and dextrose.   Upon recheck his glucose was low at 30, he was given food to eat and his recheck glucose was 63.  He is now alert, oriented.  Low suspicion for SBP at this time (abdomen is soft and non tender, no leukocytosis, compliant with  bactrim for ppx).  Needs dialysis for hyperkalemia, will plan to dialyze and reassess.  We consulted nephrology who agrees with evaluation and will plan to dialyze him this evening.  Patient later expressed that he wishes to no longer stay for dialysis this evening and would like to go home.  He is consistent in his decision and understands the risks of leaving [employer].  Discussed life-threatening risk of hyperkalemia and other conditions that could occur without medical management.  He understands and would like to sign out AMA.   Final Clinical Impression(s) / ED Diagnoses Final diagnoses:  None    Rx / DC Orders ED Discharge Orders    None       Cashtyn Pouliot, Lovena Le, MD 07/15/19 0007    Lucrezia Starch, MD 07/16/19 1255    Lucrezia Starch, MD 07/16/19 1304

## 2019-07-14 NOTE — ED Notes (Signed)
Pt returns.  Took to triage for EKG.

## 2019-07-14 NOTE — ED Notes (Addendum)
Potassium 6.3 per lab.  Dr. Roslynn Amble notified of pt in [place].  Order received from EKG.

## 2019-07-14 NOTE — ED Notes (Signed)
Pt ambulated to bathroom with steady gait. 

## 2019-07-14 NOTE — ED Notes (Signed)
Pt refused to wear a hospital gown & would not allow this RN to put him on the cardiac monitor.

## 2019-07-15 ENCOUNTER — Other Ambulatory Visit: Payer: Self-pay

## 2019-07-15 ENCOUNTER — Encounter (HOSPITAL_COMMUNITY): Payer: Self-pay | Admitting: Emergency Medicine

## 2019-07-15 ENCOUNTER — Inpatient Hospital Stay (HOSPITAL_COMMUNITY)
Admission: EM | Admit: 2019-07-15 | Discharge: 2019-07-16 | DRG: 640 | Discharge status: Against Medical Advice | Payer: Medicare Other | Attending: Internal Medicine | Admitting: Internal Medicine

## 2019-07-15 ENCOUNTER — Emergency Department (HOSPITAL_COMMUNITY): Payer: Medicare Other

## 2019-07-15 DIAGNOSIS — K7469 Other cirrhosis of liver: Secondary | ICD-10-CM

## 2019-07-15 DIAGNOSIS — M19012 Primary osteoarthritis, left shoulder: Secondary | ICD-10-CM | POA: Diagnosis present

## 2019-07-15 DIAGNOSIS — Z825 Family history of asthma and other chronic lower respiratory diseases: Secondary | ICD-10-CM

## 2019-07-15 DIAGNOSIS — K219 Gastro-esophageal reflux disease without esophagitis: Secondary | ICD-10-CM | POA: Diagnosis present

## 2019-07-15 DIAGNOSIS — Z5329 Procedure and treatment not carried out because of patient's decision for other reasons: Secondary | ICD-10-CM | POA: Diagnosis present

## 2019-07-15 DIAGNOSIS — I132 Hypertensive heart and chronic kidney disease with heart failure and with stage 5 chronic kidney disease, or end stage renal disease: Secondary | ICD-10-CM | POA: Diagnosis present

## 2019-07-15 DIAGNOSIS — R109 Unspecified abdominal pain: Secondary | ICD-10-CM | POA: Diagnosis present

## 2019-07-15 DIAGNOSIS — B182 Chronic viral hepatitis C: Secondary | ICD-10-CM | POA: Diagnosis present

## 2019-07-15 DIAGNOSIS — I2510 Atherosclerotic heart disease of native coronary artery without angina pectoris: Secondary | ICD-10-CM | POA: Diagnosis present

## 2019-07-15 DIAGNOSIS — I252 Old myocardial infarction: Secondary | ICD-10-CM

## 2019-07-15 DIAGNOSIS — I160 Hypertensive urgency: Secondary | ICD-10-CM | POA: Diagnosis present

## 2019-07-15 DIAGNOSIS — Z888 Allergy status to other drugs, medicaments and biological substances status: Secondary | ICD-10-CM

## 2019-07-15 DIAGNOSIS — E8770 Fluid overload, unspecified: Secondary | ICD-10-CM | POA: Diagnosis present

## 2019-07-15 DIAGNOSIS — I440 Atrioventricular block, first degree: Secondary | ICD-10-CM | POA: Diagnosis present

## 2019-07-15 DIAGNOSIS — Z9119 Patient's noncompliance with other medical treatment and regimen: Secondary | ICD-10-CM

## 2019-07-15 DIAGNOSIS — B1920 Unspecified viral hepatitis C without hepatic coma: Secondary | ICD-10-CM | POA: Diagnosis present

## 2019-07-15 DIAGNOSIS — E875 Hyperkalemia: Secondary | ICD-10-CM | POA: Diagnosis present

## 2019-07-15 DIAGNOSIS — I447 Left bundle-branch block, unspecified: Secondary | ICD-10-CM | POA: Diagnosis present

## 2019-07-15 DIAGNOSIS — Z7951 Long term (current) use of inhaled steroids: Secondary | ICD-10-CM

## 2019-07-15 DIAGNOSIS — I480 Paroxysmal atrial fibrillation: Secondary | ICD-10-CM

## 2019-07-15 DIAGNOSIS — F17210 Nicotine dependence, cigarettes, uncomplicated: Secondary | ICD-10-CM | POA: Diagnosis present

## 2019-07-15 DIAGNOSIS — E162 Hypoglycemia, unspecified: Secondary | ICD-10-CM | POA: Diagnosis not present

## 2019-07-15 DIAGNOSIS — Z992 Dependence on renal dialysis: Secondary | ICD-10-CM | POA: Diagnosis not present

## 2019-07-15 DIAGNOSIS — J449 Chronic obstructive pulmonary disease, unspecified: Secondary | ICD-10-CM

## 2019-07-15 DIAGNOSIS — R9431 Abnormal electrocardiogram [ECG] [EKG]: Secondary | ICD-10-CM | POA: Diagnosis present

## 2019-07-15 DIAGNOSIS — D631 Anemia in chronic kidney disease: Secondary | ICD-10-CM

## 2019-07-15 DIAGNOSIS — I5033 Acute on chronic diastolic (congestive) heart failure: Secondary | ICD-10-CM | POA: Diagnosis present

## 2019-07-15 DIAGNOSIS — Z87442 Personal history of urinary calculi: Secondary | ICD-10-CM

## 2019-07-15 DIAGNOSIS — Z66 Do not resuscitate: Secondary | ICD-10-CM | POA: Diagnosis present

## 2019-07-15 DIAGNOSIS — Z20822 Contact with and (suspected) exposure to covid-19: Secondary | ICD-10-CM | POA: Diagnosis present

## 2019-07-15 DIAGNOSIS — I050 Rheumatic mitral stenosis: Secondary | ICD-10-CM | POA: Diagnosis present

## 2019-07-15 DIAGNOSIS — N186 End stage renal disease: Secondary | ICD-10-CM

## 2019-07-15 DIAGNOSIS — R188 Other ascites: Secondary | ICD-10-CM | POA: Diagnosis present

## 2019-07-15 DIAGNOSIS — Z9115 Patient's noncompliance with renal dialysis: Secondary | ICD-10-CM

## 2019-07-15 DIAGNOSIS — E8779 Other fluid overload: Secondary | ICD-10-CM | POA: Diagnosis not present

## 2019-07-15 DIAGNOSIS — N189 Chronic kidney disease, unspecified: Secondary | ICD-10-CM | POA: Diagnosis present

## 2019-07-15 DIAGNOSIS — Z8701 Personal history of pneumonia (recurrent): Secondary | ICD-10-CM

## 2019-07-15 DIAGNOSIS — Z886 Allergy status to analgesic agent status: Secondary | ICD-10-CM

## 2019-07-15 DIAGNOSIS — B181 Chronic viral hepatitis B without delta-agent: Secondary | ICD-10-CM | POA: Diagnosis present

## 2019-07-15 DIAGNOSIS — Z79899 Other long term (current) drug therapy: Secondary | ICD-10-CM

## 2019-07-15 DIAGNOSIS — Z8249 Family history of ischemic heart disease and other diseases of the circulatory system: Secondary | ICD-10-CM

## 2019-07-15 DIAGNOSIS — Z885 Allergy status to narcotic agent status: Secondary | ICD-10-CM

## 2019-07-15 DIAGNOSIS — Z955 Presence of coronary angioplasty implant and graft: Secondary | ICD-10-CM

## 2019-07-15 DIAGNOSIS — K7460 Unspecified cirrhosis of liver: Secondary | ICD-10-CM | POA: Diagnosis present

## 2019-07-15 LAB — COMPREHENSIVE METABOLIC PANEL
ALT: 11 U/L (ref 0–44)
AST: 25 U/L (ref 15–41)
Albumin: 3.7 g/dL (ref 3.5–5.0)
Alkaline Phosphatase: 79 U/L (ref 38–126)
Anion gap: 17 — ABNORMAL HIGH (ref 5–15)
BUN: 53 mg/dL — ABNORMAL HIGH (ref 6–20)
CO2: 22 mmol/L (ref 22–32)
Calcium: 9.9 mg/dL (ref 8.9–10.3)
Chloride: 94 mmol/L — ABNORMAL LOW (ref 98–111)
Creatinine, Ser: 10.82 mg/dL — ABNORMAL HIGH (ref 0.61–1.24)
GFR calc Af Amer: 5 mL/min — ABNORMAL LOW (ref >60)
GFR calc non Af Amer: 5 mL/min — ABNORMAL LOW (ref >60)
Glucose, Bld: 87 mg/dL (ref 70–99)
Potassium: 7 mmol/L (ref 3.5–5.1)
Sodium: 133 mmol/L — ABNORMAL LOW (ref 135–145)
Total Bilirubin: 0.9 mg/dL (ref 0.3–1.2)
Total Protein: 6.4 g/dL — ABNORMAL LOW (ref 6.5–8.1)

## 2019-07-15 LAB — BASIC METABOLIC PANEL
Anion gap: 15 (ref 5–15)
BUN: 56 mg/dL — ABNORMAL HIGH (ref 6–20)
CO2: 23 mmol/L (ref 22–32)
Calcium: 10.2 mg/dL (ref 8.9–10.3)
Chloride: 92 mmol/L — ABNORMAL LOW (ref 98–111)
Creatinine, Ser: 11.61 mg/dL — ABNORMAL HIGH (ref 0.61–1.24)
GFR calc Af Amer: 5 mL/min — ABNORMAL LOW (ref >60)
GFR calc non Af Amer: 4 mL/min — ABNORMAL LOW (ref >60)
Glucose, Bld: 90 mg/dL (ref 70–99)
Potassium: 6.6 mmol/L (ref 3.5–5.1)
Sodium: 130 mmol/L — ABNORMAL LOW (ref 135–145)

## 2019-07-15 LAB — CBC
HCT: 26.4 % — ABNORMAL LOW (ref 39.0–52.0)
Hemoglobin: 9.2 g/dL — ABNORMAL LOW (ref 13.0–17.0)
MCH: 27 pg (ref 26.0–34.0)
MCHC: 34.8 g/dL (ref 30.0–36.0)
MCV: 77.4 fL — ABNORMAL LOW (ref 80.0–100.0)
Platelets: 155 10*3/uL (ref 150–400)
RBC: 3.41 MIL/uL — ABNORMAL LOW (ref 4.22–5.81)
RDW: 14.4 % (ref 11.5–15.5)
WBC: 8.1 10*3/uL (ref 4.0–10.5)
nRBC: 0 % (ref 0.0–0.2)

## 2019-07-15 LAB — CBG MONITORING, ED: Glucose-Capillary: 72 mg/dL (ref 70–99)

## 2019-07-15 LAB — LIPASE, BLOOD: Lipase: 20 U/L (ref 11–51)

## 2019-07-15 MED ORDER — LACTULOSE 10 GM/15ML PO SOLN
30.0000 g | Freq: Two times a day (BID) | ORAL | Status: DC
  Filled 2019-07-15: qty 45

## 2019-07-15 MED ORDER — HYDRALAZINE HCL 50 MG PO TABS
50.0000 mg | ORAL_TABLET | Freq: Three times a day (TID) | ORAL | Status: DC
  Administered 2019-07-16 (×2): 50 mg via ORAL
  Filled 2019-07-15 (×2): qty 1
  Filled 2019-07-15: qty 2

## 2019-07-15 MED ORDER — SODIUM CHLORIDE 0.9 % IV SOLN: 250.0000 mL | INTRAVENOUS | Status: DC | PRN

## 2019-07-15 MED ORDER — MOMETASONE FURO-FORMOTEROL FUM 200-5 MCG/ACT IN AERO
2.0000 | INHALATION_SPRAY | Freq: Two times a day (BID) | RESPIRATORY_TRACT | Status: DC
  Filled 2019-07-15: qty 8.8

## 2019-07-15 MED ORDER — SODIUM BICARBONATE 8.4 % IV SOLN
50.0000 meq | Freq: Once | INTRAVENOUS | Status: AC
  Administered 2019-07-15: 21:00:00 50 meq via INTRAVENOUS
  Filled 2019-07-15: qty 50

## 2019-07-15 MED ORDER — SODIUM CHLORIDE 0.9% FLUSH: 3.0000 mL | Freq: Two times a day (BID) | INTRAVENOUS | Status: DC

## 2019-07-15 MED ORDER — ALBUTEROL SULFATE HFA 108 (90 BASE) MCG/ACT IN AERS
2.0000 | INHALATION_SPRAY | RESPIRATORY_TRACT | Status: DC | PRN
  Filled 2019-07-15: qty 6.7

## 2019-07-15 MED ORDER — SODIUM CHLORIDE 0.9% FLUSH
3.0000 mL | Freq: Once | INTRAVENOUS | Status: AC
  Administered 2019-07-15: 3 mL via INTRAVENOUS

## 2019-07-15 MED ORDER — CHLORHEXIDINE GLUCONATE CLOTH 2 % EX PADS
6.0000 | MEDICATED_PAD | Freq: Every day | CUTANEOUS | Status: DC
  Administered 2019-07-16: 02:00:00 6 via TOPICAL

## 2019-07-15 MED ORDER — METOPROLOL TARTRATE 100 MG PO TABS
100.0000 mg | ORAL_TABLET | Freq: Two times a day (BID) | ORAL | Status: DC
  Administered 2019-07-16 (×2): 100 mg via ORAL
  Filled 2019-07-15 (×2): qty 1
  Filled 2019-07-15: qty 4

## 2019-07-15 MED ORDER — SODIUM CHLORIDE 0.9 % IV SOLN
1.0000 g | Freq: Once | INTRAVENOUS | Status: AC
  Administered 2019-07-15: 1 g via INTRAVENOUS
  Filled 2019-07-15: qty 10

## 2019-07-15 MED ORDER — SODIUM POLYSTYRENE SULFONATE 15 GM/60ML PO SUSP
45.0000 g | Freq: Once | ORAL | Status: AC
  Administered 2019-07-15: 45 g via ORAL
  Filled 2019-07-15: qty 180

## 2019-07-15 MED ORDER — SODIUM CHLORIDE 0.9% FLUSH
3.0000 mL | Freq: Two times a day (BID) | INTRAVENOUS | Status: DC
  Administered 2019-07-15: 3 mL via INTRAVENOUS

## 2019-07-15 MED ORDER — ACETAMINOPHEN 325 MG PO TABS: 650.0000 mg | ORAL_TABLET | Freq: Four times a day (QID) | ORAL | Status: DC | PRN

## 2019-07-15 MED ORDER — INSULIN ASPART 100 UNIT/ML IV SOLN
5.0000 [IU] | Freq: Once | INTRAVENOUS | Status: AC
  Administered 2019-07-15: 5 [IU] via INTRAVENOUS

## 2019-07-15 MED ORDER — SODIUM CHLORIDE 0.9% FLUSH: 3.0000 mL | INTRAVENOUS | Status: DC | PRN

## 2019-07-15 MED ORDER — ACETAMINOPHEN 650 MG RE SUPP: 650.0000 mg | Freq: Four times a day (QID) | RECTAL | Status: DC | PRN

## 2019-07-15 MED ORDER — HEPARIN SODIUM (PORCINE) 5000 UNIT/ML IJ SOLN
5000.0000 [IU] | Freq: Three times a day (TID) | INTRAMUSCULAR | Status: DC
  Filled 2019-07-15: qty 1

## 2019-07-15 MED ORDER — DEXTROSE 50 % IV SOLN
1.0000 | Freq: Once | INTRAVENOUS | Status: AC
  Administered 2019-07-15: 21:00:00 50 mL via INTRAVENOUS
  Filled 2019-07-15: qty 50

## 2019-07-15 NOTE — Consult Note (Signed)
Renal Service Consult Note [facility]  Erich Kochan [facility] 07/15/2019 [name] Requesting Physician:  Dr Rogene Houston  Reason for Consult:  ESRD pt w/ high K+ HPI: The patient is a 59 y.o. year-old w/ hx of MI, HTN, hep C, chron hep B, ESRD on HD, COPD, anemia , cirrhosis and recurrent ascites presenting to ED w/ abd pain and swelling, nausea , but no vomiting. Missed HD on Friday. Came to ED yest on Sat but refused dialysis and was dc'd home.  Here tonight K+ is 7.0. asked to see for dialysis.   Patient seen in ER.  No SOB, +abd distension and pain, not severe. Wants to eat.     ROS  denies CP  no joint pain   no HA  no blurry vision  no rash  no diarrhea  no nausea/ vomiting   Past Medical History  Past Medical History:  Diagnosis Date  . Anemia   . Anxiety   . Arthritis    left shoulder  . Atherosclerosis of aorta ([place])   . Cardiomegaly   . Chest pain    DATE UNKNOWN, C/O PERIODICALLY  . Cocaine abuse ([place])   . COPD exacerbation ([place]) 08/17/2016  . Coronary artery disease    stent 02/22/17  . ESRD (end stage renal disease) on dialysis ([place])    "[name]; MWF" (07/04/2017)  . GERD (gastroesophageal reflux disease)    DATE UNKNOWN  . Hemorrhoids   . Hepatitis B, chronic ([place])   . Hepatitis C   . History of kidney stones   . Hyperkalemia   . Hypertension   . Kidney failure   . Metabolic bone disease    Patient denies  . Mitral stenosis   . Myocardial infarction ([place])   . Pneumonia   . Pulmonary edema   . Solitary rectal ulcer syndrome 07/2017   at flex sig for rectal bleeding  . Tubular adenoma of colon    Past Surgical History  Past Surgical History:  Procedure Laterality Date  . A/V FISTULAGRAM Left 05/26/2017   Procedure: A/V FISTULAGRAM;  Surgeon: Conrad Blair, MD;  Location: [place] [facility];  Service: Cardiovascular;  Laterality: Left;  . A/V FISTULAGRAM Right 11/18/2017   Procedure: A/V FISTULAGRAM - Right Arm;   Surgeon: Elam Dutch, MD;  Location: [place] [facility];  Service: Cardiovascular;  Laterality: Right;  . APPLICATION OF WOUND VAC Left 06/14/2017   Procedure: APPLICATION OF WOUND VAC;  Surgeon: Katha Cabal, MD;  Location: [facility];  Service: Vascular;  Laterality: Left;  . AV FISTULA PLACEMENT  2012   BELIEVED WAS PLACED IN JUNE  . AV FISTULA PLACEMENT Right 08/09/2017   Procedure: Creation Right arm ARTERIOVENOUS BRACHIOCEPOHALIC FISTULA;  Surgeon: Elam Dutch, MD;  Location: [facility] OR;  Service: Vascular;  Laterality: Right;  . AV FISTULA PLACEMENT Right 11/22/2017   Procedure: INSERTION OF ARTERIOVENOUS (AV) GORE-TEX GRAFT RIGHT UPPER ARM;  Surgeon: Elam Dutch, MD;  Location: [place];  Service: Vascular;  Laterality: Right;  . BIOPSY  01/25/2018   Procedure: BIOPSY;  Surgeon: Jerene Bears, MD;  Location: [place];  Service: Gastroenterology;;  . BIOPSY  04/10/2019   Procedure: BIOPSY;  Surgeon: Jerene Bears, MD;  Location: [facility];  Service: Gastroenterology;;  . COLONOSCOPY    . COLONOSCOPY WITH PROPOFOL N/A 01/25/2018   Procedure: COLONOSCOPY WITH PROPOFOL;  Surgeon: Jerene Bears, MD;  Location: [place];  Service: Gastroenterology;  Laterality: N/A;  .  CORONARY STENT INTERVENTION N/A 02/22/2017   Procedure: CORONARY STENT INTERVENTION;  Surgeon: Nigel Mormon, MD;  Location: [place] [facility];  Service: Cardiovascular;  Laterality: N/A;  . ESOPHAGOGASTRODUODENOSCOPY (EGD) WITH PROPOFOL N/A 01/25/2018   Procedure: ESOPHAGOGASTRODUODENOSCOPY (EGD) WITH PROPOFOL;  Surgeon: Jerene Bears, MD;  Location: [place];  Service: Gastroenterology;  Laterality: N/A;  . ESOPHAGOGASTRODUODENOSCOPY (EGD) WITH PROPOFOL N/A 04/10/2019   Procedure: ESOPHAGOGASTRODUODENOSCOPY (EGD) WITH PROPOFOL;  Surgeon: Jerene Bears, MD;  Location: [facility];  Service: Gastroenterology;  Laterality: N/A;  . FLEXIBLE SIGMOIDOSCOPY N/A 07/15/2017   Procedure: FLEXIBLE  SIGMOIDOSCOPY;  Surgeon: Carol Ada, MD;  Location: [place];  Service: Endoscopy;  Laterality: N/A;  . HEMORRHOID BANDING    . I & D EXTREMITY Left 06/01/2017   Procedure: IRRIGATION AND DEBRIDEMENT LEFT ARM HEMATOMA WITH LIGATION OF LEFT ARM AV FISTULA;  Surgeon: Elam Dutch, MD;  Location: [place];  Service: Vascular;  Laterality: Left;  . I & D EXTREMITY Left 06/14/2017   Procedure: IRRIGATION AND DEBRIDEMENT EXTREMITY;  Surgeon: Katha Cabal, MD;  Location: [facility];  Service: Vascular;  Laterality: Left;  . INSERTION OF DIALYSIS CATHETER  05/30/2017  . INSERTION OF DIALYSIS CATHETER N/A 05/30/2017   Procedure: INSERTION OF DIALYSIS CATHETER;  Surgeon: Elam Dutch, MD;  Location: [place];  Service: Vascular;  Laterality: N/A;  . IR PARACENTESIS  08/30/2017  . IR PARACENTESIS  09/29/2017  . IR PARACENTESIS  10/28/2017  . IR PARACENTESIS  11/09/2017  . IR PARACENTESIS  11/16/2017  . IR PARACENTESIS  11/28/2017  . IR PARACENTESIS  12/01/2017  . IR PARACENTESIS  12/06/2017  . IR PARACENTESIS  01/03/2018  . IR PARACENTESIS  01/23/2018  . IR PARACENTESIS  02/07/2018  . IR PARACENTESIS  02/21/2018  . IR PARACENTESIS  03/06/2018  . IR PARACENTESIS  03/17/2018  . IR PARACENTESIS  04/04/2018  . IR PARACENTESIS  12/28/2018  . IR PARACENTESIS  01/08/2019  . IR PARACENTESIS  01/23/2019  . IR PARACENTESIS  02/01/2019  . IR PARACENTESIS  02/19/2019  . IR PARACENTESIS  03/01/2019  . IR PARACENTESIS  03/15/2019  . IR PARACENTESIS  04/03/2019  . IR PARACENTESIS  04/12/2019  . IR PARACENTESIS  05/01/2019  . IR PARACENTESIS  05/08/2019  . IR PARACENTESIS  05/24/2019  . IR PARACENTESIS  06/12/2019  . IR PARACENTESIS  07/09/2019  . IR RADIOLOGIST EVAL & MGMT  02/14/2018  . IR RADIOLOGIST EVAL & MGMT  02/22/2019  . LEFT HEART CATH AND CORONARY ANGIOGRAPHY N/A 02/22/2017   Procedure: LEFT HEART CATH AND CORONARY ANGIOGRAPHY;  Surgeon: Nigel Mormon, MD;  Location: [place] [facility];  Service:  Cardiovascular;  Laterality: N/A;  . LIGATION OF ARTERIOVENOUS  FISTULA Left 4/0/9811   Procedure: Plication of Left Arm Arteriovenous Fistula;  Surgeon: Elam Dutch, MD;  Location: [place];  Service: Vascular;  Laterality: Left;  . POLYPECTOMY    . POLYPECTOMY  01/25/2018   Procedure: POLYPECTOMY;  Surgeon: Jerene Bears, MD;  Location: [place];  Service: Gastroenterology;;  . REVISON OF ARTERIOVENOUS FISTULA Left 03/25/7828   Procedure: PLICATION OF DISTAL ANEURYSMAL SEGEMENT OF LEFT UPPER ARM ARTERIOVENOUS FISTULA;  Surgeon: Elam Dutch, MD;  Location: [place];  Service: Vascular;  Laterality: Left;  . REVISON OF ARTERIOVENOUS FISTULA Left 5/62/1308   Procedure: Plication of Left Upper Arm Fistula ;  Surgeon: Waynetta Sandy, MD;  Location: [place];  Service: Vascular;  Laterality: Left;  . SKIN  GRAFT SPLIT THICKNESS LEG / FOOT Left    SKIN GRAFT SPLIT THICKNESS LEFT ARM DONOR SITE: LEFT ANTERIOR THIGH  . SKIN SPLIT GRAFT Left 07/04/2017   Procedure: SKIN GRAFT SPLIT THICKNESS LEFT ARM DONOR SITE: LEFT ANTERIOR THIGH;  Surgeon: Elam Dutch, MD;  Location: [place];  Service: Vascular;  Laterality: Left;  . THROMBECTOMY W/ EMBOLECTOMY Left 06/05/2017   Procedure: EXPLORATION OF LEFT ARM FOR BLEEDING; OVERSEWED PROXIMAL FISTULA;  Surgeon: Angelia Mould, MD;  Location: [place];  Service: Vascular;  Laterality: Left;  . WOUND EXPLORATION Left 06/03/2017   Procedure: WOUND EXPLORATION WITH WOUND VAC APPLICATION TO LEFT ARM;  Surgeon: Angelia Mould, MD;  Location: [facility] OR;  Service: Vascular;  Laterality: Left;   Family History  Family History  Problem Relation Age of Onset  . Heart disease Mother   . Lung cancer Mother   . Heart disease Father   . Malignant hyperthermia Father   . COPD Father   . Throat cancer Sister   . Esophageal cancer Sister   . Hypertension Other   . COPD Other   . Colon cancer Neg Hx   . Colon polyps Neg Hx   . Rectal cancer Neg  Hx   . Stomach cancer Neg Hx    Social History  reports that he has been smoking cigarettes. He started smoking about 45 years ago. He has a 21.50 pack-year smoking history. He has never used smokeless tobacco. He reports previous alcohol use. He reports previous drug use. Drugs: Marijuana and Cocaine. Allergies  Allergies  Allergen Reactions  . Morphine And Related Other (See Comments)    Stomach pain  . Aspirin Other (See Comments)    STOMACH PAIN  . Clonidine Derivatives Itching  . Tramadol Itching  . Tylenol [Acetaminophen] Nausea Only    Stomach ache   Home medications Prior to Admission medications   Medication Sig Start Date End Date Taking? Authorizing Provider  albuterol (VENTOLIN HFA) 108 (90 Base) MCG/ACT inhaler Inhale 2 puffs into the lungs every 6 (six) hours as needed for wheezing or shortness of breath.    [provider]  gabapentin (NEURONTIN) 100 MG capsule Take 100 mg by mouth 2 (two) times daily. 07/03/19   [provider]  hydrALAZINE (APRESOLINE) 100 MG tablet Take 0.5 tablets (50 mg total) by mouth 3 (three) times daily. 05/31/19   [place], Carley D, DO  lactulose (CHRONULAC) 10 GM/15ML solution TAKE 45 MLS BY MOUTH 2 TIMES DAILY. Patient taking differently: Take 30 g by mouth 2 (two) times daily.  06/04/19   Al Decant, MD  loperamide (IMODIUM A-D) 2 MG tablet Take 2 mg by mouth 4 (four) times daily as needed for diarrhea or loose stools.    [provider]  metoprolol tartrate (LOPRESSOR) 100 MG tablet Take 1 tablet (100 mg total) by mouth 2 (two) times daily. 06/04/19   Al Decant, MD  montelukast (SINGULAIR) 10 MG tablet Take 10 mg by mouth every evening.     [provider]  ondansetron (ZOFRAN) 8 MG tablet Take 8 mg by mouth every 4 (four) hours as needed. 06/14/19   [provider]  pantoprazole (PROTONIX) 40 MG tablet Take 1 tablet (40 mg total) by mouth daily. Patient taking differently: Take 40 mg by  mouth See admin instructions. Take 40 mg by mouth in the morning before breakfast and an additional 40 mg once a day, if symptoms persist 06/05/19   Maudie Mercury, MD  sevelamer  carbonate (RENVELA) 800 MG tablet Take 4 tablets with meals  And 2 tablets twice daily with snacks Patient taking differently: Take 1,600-2,400 mg by mouth See admin instructions. Take 2,400 mg by mouth three times a day with meals and 1,600 mg with each snack 01/22/18   Patrecia Pour, MD  sulfamethoxazole-trimethoprim (BACTRIM DS) 800-160 MG tablet Take 1 tablet by mouth daily. Patient taking differently: Take 1 tablet by mouth 2 (two) times daily.  12/30/18   Geradine Girt, DO  SYMBICORT 160-4.5 MCG/ACT inhaler Inhale 2 puffs into the lungs 2 (two) times daily.  11/30/18   [provider]  zolpidem (AMBIEN) 10 MG tablet Take 10 mg by mouth at bedtime as needed for sleep.    [provider]   Liver Function Tests Recent Labs  Lab 07/09/19 0515 07/14/19 1555 07/15/19 1115  AST 24 26 25   ALT 12 14 11   ALKPHOS 94 94 79  BILITOT 0.4 0.6 0.9  PROT 6.7 6.7 6.4*  ALBUMIN 3.7 3.5 3.7   Recent Labs  Lab 07/09/19 0515 07/14/19 1555 07/15/19 1115  LIPASE 25 25 20    CBC Recent Labs  Lab 07/09/19 0515 07/14/19 1555 07/15/19 1115  WBC 8.0 9.3 8.1  HGB 9.4* 9.8* 9.2*  HCT 27.5* 28.0* 26.4*  MCV 79.3* 78.4* 77.4*  PLT 158 175 025   Basic Metabolic Panel Recent Labs  Lab 07/09/19 0515 07/14/19 1555 07/15/19 1115  NA 133* 134* 133*  K 5.6* 6.3* 7.0*  CL 91* 93* 94*  CO2 26 24 22   GLUCOSE 136* 141* 87  BUN 31* 44* 53*  CREATININE 7.83* 9.63* 10.82*  CALCIUM 9.9 9.9 9.9   Iron/TIBC/Ferritin/ %Sat    Component Value Date/Time   IRON 104 09/03/2017 0800   TIBC 343 09/03/2017 0800   FERRITIN 513 (H) 09/03/2017 0800   IRONPCTSAT 30 09/03/2017 0800    Vitals:   07/15/19 1111 07/15/19 1233 07/15/19 1730 07/15/19 1828  BP: (!) 190/80 (!) 199/103 (!) 202/99 (!) 189/97  Pulse: 68 66  69 68  Resp: 16 20 19 12   Temp: 98.5 F (36.9 C) 98 F (36.7 C)    TempSrc: Oral Oral    SpO2: 100% 100% 99% 98%    Exam Gen alert, no distress, lying flat No rash, cyanosis or gangrene Sclera anicteric, throat clear   No jvd or bruits Chest clear bilat to bases no rales RRR no MRG Abd soft ntnd no mass, small ascites GU normal male MS no joint effusions or deformity Ext no LE edema, no wounds or ulcers Neuro is alert, Ox 3 , nf   R arm AVG+bruit    Outpt HD: [facility] [place]  4h   65kg  2/2 bath  450/800  R arm AVG     Assessment/ Plan: 1. Hyperkalemia - given Lokelma and IV Ca in ED, ordered also kayexalate.  Will plan on acute tonight later tonight.  EKG showing old LBBB unchanged from prior and no bradycardia.  2. ESRD - on HD MWF.  Will get HD later tonight as above.  3. Chronic hepatitis B 4. H/o hep C 5. Htn - cont meds w/ metoprolol/ hydralazine 6. Cirrhosis - on lactulose 7. COPD - cont meds      [name]  MD 07/15/2019, 7:56 PM

## 2019-07-15 NOTE — H&P (Signed)
History and Physical    [name] UVO:[id number] DOB: Sep 17, 1960 DOA: 07/15/2019  PCP: [name], FNP   Patient coming from: Home   Chief Complaint: SOB, abdominal distension   HPI: [name] is a 59 y.o. male with medical history significant for hepatitis B and C with decompensated cirrhosis, ESRD, hypertension, and chronic anemia, now presenting to the emergency department for evaluation of abdominal distention and shortness of breath.  Patient reports chronic abdominal distention and discomfort, requiring regular paracenteses, last had therapeutic paracentesis on 07/09/2019.  He is missed multiple dialysis sessions recently, was in the emergency department last night with hyperkalemia, nephrology was consulted and planning for inpatient dialysis, however the patient left AMA at that time.  He now returns with the same complaints.  He denies any fevers, chills, chest pain, or palpitations.  ED Course: Upon arrival to the ED, patient is found to be afebrile, saturating upper 90s on room air, and with blood pressure as high as 200/100.  EKG features sinus rhythm with first-degree AV nodal block, chronic LBBB, and QTc 548 ms.  Chest x-ray with minimal diffuse interstitial opacity, likely reflecting edema.  Chemistry panel with potassium 7.0, normal bicarbonate, BUN 53, and anion gap 17.  CBC with chronic stable microcytic anemia.  Patient was treated with Kayexalate and calcium in the emergency department and nephrology was consulted, recommending medical admission.  Review of Systems:  All other systems reviewed and apart from HPI, are negative.  Past Medical History:  Diagnosis Date  . Anemia   . Anxiety   . Arthritis    left shoulder  . Atherosclerosis of aorta ([place])   . Cardiomegaly   . Chest pain    DATE UNKNOWN, C/O PERIODICALLY  . Cocaine abuse ([place])   . COPD exacerbation ([place]) 08/17/2016  . Coronary artery disease    stent 02/22/17  . ESRD (end stage  renal disease) on dialysis ([place])    "[name]; MWF" (07/04/2017)  . GERD (gastroesophageal reflux disease)    DATE UNKNOWN  . Hemorrhoids   . Hepatitis B, chronic ([place])   . Hepatitis C   . History of kidney stones   . Hyperkalemia   . Hypertension   . Kidney failure   . Metabolic bone disease    Patient denies  . Mitral stenosis   . Myocardial infarction ([place])   . Pneumonia   . Pulmonary edema   . Solitary rectal ulcer syndrome 07/2017   at flex sig for rectal bleeding  . Tubular adenoma of colon     Past Surgical History:  Procedure Laterality Date  . A/V FISTULAGRAM Left 05/26/2017   Procedure: A/V FISTULAGRAM;  Surgeon: Conrad Hugo, MD;  Location: [place] [facility];  Service: Cardiovascular;  Laterality: Left;  . A/V FISTULAGRAM Right 11/18/2017   Procedure: A/V FISTULAGRAM - Right Arm;  Surgeon: Elam Dutch, MD;  Location: [place] [facility];  Service: Cardiovascular;  Laterality: Right;  . APPLICATION OF WOUND VAC Left 06/14/2017   Procedure: APPLICATION OF WOUND VAC;  Surgeon: Katha Cabal, MD;  Location: [facility];  Service: Vascular;  Laterality: Left;  . AV FISTULA PLACEMENT  2012   BELIEVED WAS PLACED IN JUNE  . AV FISTULA PLACEMENT Right 08/09/2017   Procedure: Creation Right arm ARTERIOVENOUS BRACHIOCEPOHALIC FISTULA;  Surgeon: Elam Dutch, MD;  Location: [facility];  Service: Vascular;  Laterality: Right;  . AV FISTULA PLACEMENT Right 11/22/2017   Procedure: INSERTION OF ARTERIOVENOUS (  AV) GORE-TEX GRAFT RIGHT UPPER ARM;  Surgeon: Elam Dutch, MD;  Location: [place];  Service: Vascular;  Laterality: Right;  . BIOPSY  01/25/2018   Procedure: BIOPSY;  Surgeon: Jerene Bears, MD;  Location: [place];  Service: Gastroenterology;;  . BIOPSY  04/10/2019   Procedure: BIOPSY;  Surgeon: Jerene Bears, MD;  Location: [facility];  Service: Gastroenterology;;  . COLONOSCOPY    . COLONOSCOPY WITH PROPOFOL N/A 01/25/2018   Procedure: COLONOSCOPY WITH  PROPOFOL;  Surgeon: Jerene Bears, MD;  Location: [place];  Service: Gastroenterology;  Laterality: N/A;  . CORONARY STENT INTERVENTION N/A 02/22/2017   Procedure: CORONARY STENT INTERVENTION;  Surgeon: Nigel Mormon, MD;  Location: [place] [facility];  Service: Cardiovascular;  Laterality: N/A;  . ESOPHAGOGASTRODUODENOSCOPY (EGD) WITH PROPOFOL N/A 01/25/2018   Procedure: ESOPHAGOGASTRODUODENOSCOPY (EGD) WITH PROPOFOL;  Surgeon: Jerene Bears, MD;  Location: [place];  Service: Gastroenterology;  Laterality: N/A;  . ESOPHAGOGASTRODUODENOSCOPY (EGD) WITH PROPOFOL N/A 04/10/2019   Procedure: ESOPHAGOGASTRODUODENOSCOPY (EGD) WITH PROPOFOL;  Surgeon: Jerene Bears, MD;  Location: [facility];  Service: Gastroenterology;  Laterality: N/A;  . FLEXIBLE SIGMOIDOSCOPY N/A 07/15/2017   Procedure: FLEXIBLE SIGMOIDOSCOPY;  Surgeon: Carol Ada, MD;  Location: [place];  Service: Endoscopy;  Laterality: N/A;  . HEMORRHOID BANDING    . I & D EXTREMITY Left 06/01/2017   Procedure: IRRIGATION AND DEBRIDEMENT LEFT ARM HEMATOMA WITH LIGATION OF LEFT ARM AV FISTULA;  Surgeon: Elam Dutch, MD;  Location: [place];  Service: Vascular;  Laterality: Left;  . I & D EXTREMITY Left 06/14/2017   Procedure: IRRIGATION AND DEBRIDEMENT EXTREMITY;  Surgeon: Katha Cabal, MD;  Location: [facility];  Service: Vascular;  Laterality: Left;  . INSERTION OF DIALYSIS CATHETER  05/30/2017  . INSERTION OF DIALYSIS CATHETER N/A 05/30/2017   Procedure: INSERTION OF DIALYSIS CATHETER;  Surgeon: Elam Dutch, MD;  Location: [place];  Service: Vascular;  Laterality: N/A;  . IR PARACENTESIS  08/30/2017  . IR PARACENTESIS  09/29/2017  . IR PARACENTESIS  10/28/2017  . IR PARACENTESIS  11/09/2017  . IR PARACENTESIS  11/16/2017  . IR PARACENTESIS  11/28/2017  . IR PARACENTESIS  12/01/2017  . IR PARACENTESIS  12/06/2017  . IR PARACENTESIS  01/03/2018  . IR PARACENTESIS  01/23/2018  . IR PARACENTESIS  02/07/2018  . IR  PARACENTESIS  02/21/2018  . IR PARACENTESIS  03/06/2018  . IR PARACENTESIS  03/17/2018  . IR PARACENTESIS  04/04/2018  . IR PARACENTESIS  12/28/2018  . IR PARACENTESIS  01/08/2019  . IR PARACENTESIS  01/23/2019  . IR PARACENTESIS  02/01/2019  . IR PARACENTESIS  02/19/2019  . IR PARACENTESIS  03/01/2019  . IR PARACENTESIS  03/15/2019  . IR PARACENTESIS  04/03/2019  . IR PARACENTESIS  04/12/2019  . IR PARACENTESIS  05/01/2019  . IR PARACENTESIS  05/08/2019  . IR PARACENTESIS  05/24/2019  . IR PARACENTESIS  06/12/2019  . IR PARACENTESIS  07/09/2019  . IR RADIOLOGIST EVAL & MGMT  02/14/2018  . IR RADIOLOGIST EVAL & MGMT  02/22/2019  . LEFT HEART CATH AND CORONARY ANGIOGRAPHY N/A 02/22/2017   Procedure: LEFT HEART CATH AND CORONARY ANGIOGRAPHY;  Surgeon: Nigel Mormon, MD;  Location: [place] [facility];  Service: Cardiovascular;  Laterality: N/A;  . LIGATION OF ARTERIOVENOUS  FISTULA Left 6/0/1093   Procedure: Plication of Left Arm Arteriovenous Fistula;  Surgeon: Elam Dutch, MD;  Location: [place];  Service: Vascular;  Laterality: Left;  .  POLYPECTOMY    . POLYPECTOMY  01/25/2018   Procedure: POLYPECTOMY;  Surgeon: Jerene Bears, MD;  Location: [place];  Service: Gastroenterology;;  . REVISON OF ARTERIOVENOUS FISTULA Left 9/38/1829   Procedure: PLICATION OF DISTAL ANEURYSMAL SEGEMENT OF LEFT UPPER ARM ARTERIOVENOUS FISTULA;  Surgeon: Elam Dutch, MD;  Location: [place];  Service: Vascular;  Laterality: Left;  . REVISON OF ARTERIOVENOUS FISTULA Left 9/37/1696   Procedure: Plication of Left Upper Arm Fistula ;  Surgeon: Waynetta Sandy, MD;  Location: [place];  Service: Vascular;  Laterality: Left;  . SKIN GRAFT SPLIT THICKNESS LEG / FOOT Left    SKIN GRAFT SPLIT THICKNESS LEFT ARM DONOR SITE: LEFT ANTERIOR THIGH  . SKIN SPLIT GRAFT Left 07/04/2017   Procedure: SKIN GRAFT SPLIT THICKNESS LEFT ARM DONOR SITE: LEFT ANTERIOR THIGH;  Surgeon: Elam Dutch, MD;  Location: [place];   Service: Vascular;  Laterality: Left;  . THROMBECTOMY W/ EMBOLECTOMY Left 06/05/2017   Procedure: EXPLORATION OF LEFT ARM FOR BLEEDING; OVERSEWED PROXIMAL FISTULA;  Surgeon: Angelia Mould, MD;  Location: [place];  Service: Vascular;  Laterality: Left;  . WOUND EXPLORATION Left 06/03/2017   Procedure: WOUND EXPLORATION WITH WOUND VAC APPLICATION TO LEFT ARM;  Surgeon: Angelia Mould, MD;  Location: [place];  Service: Vascular;  Laterality: Left;     reports that he has been smoking cigarettes. He started smoking about 45 years ago. He has a 21.50 pack-year smoking history. He has never used smokeless tobacco. He reports previous alcohol use. He reports previous drug use. Drugs: Marijuana and Cocaine.  Allergies  Allergen Reactions  . Morphine And Related Other (See Comments)    Stomach pain  . Aspirin Other (See Comments)    STOMACH PAIN  . Clonidine Derivatives Itching  . Tramadol Itching  . Tylenol [Acetaminophen] Nausea Only    Stomach ache    Family History  Problem Relation Age of Onset  . Heart disease Mother   . Lung cancer Mother   . Heart disease Father   . Malignant hyperthermia Father   . COPD Father   . Throat cancer Sister   . Esophageal cancer Sister   . Hypertension Other   . COPD Other   . Colon cancer Neg Hx   . Colon polyps Neg Hx   . Rectal cancer Neg Hx   . Stomach cancer Neg Hx      Prior to Admission medications   Medication Sig Start Date End Date Taking? Authorizing Provider  albuterol (VENTOLIN HFA) 108 (90 Base) MCG/ACT inhaler Inhale 2 puffs into the lungs every 6 (six) hours as needed for wheezing or shortness of breath.    [provider]  gabapentin (NEURONTIN) 100 MG capsule Take 100 mg by mouth 2 (two) times daily. 07/03/19   [provider]  hydrALAZINE (APRESOLINE) 100 MG tablet Take 0.5 tablets (50 mg total) by mouth 3 (three) times daily. 05/31/19   [place], Carley D, DO  lactulose (CHRONULAC) 10 GM/15ML  solution TAKE 45 MLS BY MOUTH 2 TIMES DAILY. Patient taking differently: Take 30 g by mouth 2 (two) times daily.  06/04/19   Al Decant, MD  loperamide (IMODIUM A-D) 2 MG tablet Take 2 mg by mouth 4 (four) times daily as needed for diarrhea or loose stools.    [provider]  metoprolol tartrate (LOPRESSOR) 100 MG tablet Take 1 tablet (100 mg total) by mouth 2 (two) times daily. 06/04/19   Al Decant, MD  montelukast (  SINGULAIR) 10 MG tablet Take 10 mg by mouth every evening.     [provider]  ondansetron (ZOFRAN) 8 MG tablet Take 8 mg by mouth every 4 (four) hours as needed. 06/14/19   [provider]  pantoprazole (PROTONIX) 40 MG tablet Take 1 tablet (40 mg total) by mouth daily. Patient taking differently: Take 40 mg by mouth See admin instructions. Take 40 mg by mouth in the morning before breakfast and an additional 40 mg once a day, if symptoms persist 06/05/19   Maudie Mercury, MD  sevelamer carbonate (RENVELA) 800 MG tablet Take 4 tablets with meals  And 2 tablets twice daily with snacks Patient taking differently: Take 1,600-2,400 mg by mouth See admin instructions. Take 2,400 mg by mouth three times a day with meals and 1,600 mg with each snack 01/22/18   Patrecia Pour, MD  sulfamethoxazole-trimethoprim (BACTRIM DS) 800-160 MG tablet Take 1 tablet by mouth daily. Patient taking differently: Take 1 tablet by mouth 2 (two) times daily.  12/30/18   Geradine Girt, DO  SYMBICORT 160-4.5 MCG/ACT inhaler Inhale 2 puffs into the lungs 2 (two) times daily.  11/30/18   [provider]  zolpidem (AMBIEN) 10 MG tablet Take 10 mg by mouth at bedtime as needed for sleep.    [provider]    Physical Exam: Vitals:   07/15/19 1111 07/15/19 1233 07/15/19 1730 07/15/19 1828  BP: (!) 190/80 (!) 199/103 (!) 202/99 (!) 189/97  Pulse: 68 66 69 68  Resp: 16 20 19 12   Temp: 98.5 F (36.9 C) 98 F (36.7 C)    TempSrc: Oral Oral    SpO2: 100% 100%  99% 98%    Constitutional: NAD, calm  Eyes: PERTLA, lids and conjunctivae normal ENMT: Mucous membranes are moist. Posterior pharynx clear of any exudate or lesions.   Neck: normal, supple, no masses, no thyromegaly Respiratory: no wheezing, no rhonchi. Normal respiratory effort. No accessory muscle use.  Cardiovascular: S1 & S2 heard, regular rate and rhythm. No extremity edema.   Abdomen: Distended, soft, no rebound pain or guarding. Bowel sounds active.  Musculoskeletal: no clubbing / cyanosis. No joint deformity upper and lower extremities.  Skin: no significant rashes, lesions, ulcers. Warm, dry, well-perfused. Neurologic: No facial asymmetry. Sensation intact. Moving all extremities.  Psychiatric: Alert. Oriented to person, place, and situation.     Labs on Admission: I have personally reviewed following labs and imaging studies  CBC: Recent Labs  Lab 07/09/19 0515 07/14/19 1555 07/15/19 1115  WBC 8.0 9.3 8.1  HGB 9.4* 9.8* 9.2*  HCT 27.5* 28.0* 26.4*  MCV 79.3* 78.4* 77.4*  PLT 158 175 454   Basic Metabolic Panel: Recent Labs  Lab 07/09/19 0515 07/14/19 1555 07/15/19 1115  NA 133* 134* 133*  K 5.6* 6.3* 7.0*  CL 91* 93* 94*  CO2 26 24 22   GLUCOSE 136* 141* 87  BUN 31* 44* 53*  CREATININE 7.83* 9.63* 10.82*  CALCIUM 9.9 9.9 9.9   GFR: Estimated Creatinine Clearance: 7.1 mL/min (A) (by C-G formula based on SCr of 10.82 mg/dL (H)). Liver Function Tests: Recent Labs  Lab 07/09/19 0515 07/14/19 1555 07/15/19 1115  AST 24 26 25   ALT 12 14 11   ALKPHOS 94 94 79  BILITOT 0.4 0.6 0.9  PROT 6.7 6.7 6.4*  ALBUMIN 3.7 3.5 3.7   Recent Labs  Lab 07/09/19 0515 07/14/19 1555 07/15/19 1115  LIPASE 25 25 20    No results for input(s): AMMONIA in the  last 168 hours. Coagulation Profile: Recent Labs  Lab 07/09/19 0736  INR 1.2   Cardiac Enzymes: No results for input(s): CKTOTAL, CKMB, CKMBINDEX, TROPONINI in the last 168 hours. BNP (last 3 results) No  results for input(s): PROBNP in the last 8760 hours. HbA1C: No results for input(s): HGBA1C in the last 72 hours. CBG: Recent Labs  Lab 07/14/19 2008 07/14/19 2049  GLUCAP 33* 63*   Lipid Profile: No results for input(s): CHOL, HDL, LDLCALC, TRIG, CHOLHDL, LDLDIRECT in the last 72 hours. Thyroid Function Tests: No results for input(s): TSH, T4TOTAL, FREET4, T3FREE, THYROIDAB in the last 72 hours. Anemia Panel: No results for input(s): VITAMINB12, FOLATE, FERRITIN, TIBC, IRON, RETICCTPCT in the last 72 hours. Urine analysis:    Component Value Date/Time   COLORURINE YELLOW 07/16/2011 1619   APPEARANCEUR CLEAR 07/16/2011 1619   LABSPEC 1.018 07/16/2011 1619   PHURINE 7.5 07/16/2011 1619   GLUCOSEU 250 (A) 07/16/2011 1619   HGBUR MODERATE (A) 07/16/2011 1619   BILIRUBINUR SMALL (A) 07/16/2011 1619   KETONESUR NEGATIVE 07/16/2011 1619   PROTEINUR >300 (A) 07/16/2011 1619   UROBILINOGEN 1.0 07/16/2011 1619   NITRITE NEGATIVE 07/16/2011 1619   LEUKOCYTESUR SMALL (A) 07/16/2011 1619   Sepsis Labs: @LABRCNTIP (procalcitonin:4,lacticidven:4) ) Recent Results (from the past 240 hour(s))  Respiratory Panel by RT PCR (Flu A&B, Covid) - Nasopharyngeal Swab     Status: None   Collection Time: 07/14/19  8:04 PM   Specimen: Nasopharyngeal Swab  Result Value Ref Range Status   SARS Coronavirus 2 by RT PCR NEGATIVE NEGATIVE Final    Comment: (NOTE) SARS-CoV-2 target nucleic acids are NOT DETECTED. The SARS-CoV-2 RNA is generally detectable in upper respiratoy specimens during the acute phase of infection. The lowest concentration of SARS-CoV-2 viral copies this assay can detect is 131 copies/mL. A negative result does not preclude SARS-Cov-2 infection and should not be used as the sole basis for treatment or other patient management decisions. A negative result may occur with  improper specimen collection/handling, submission of specimen other than nasopharyngeal swab, presence of  viral mutation(s) within the areas targeted by this assay, and inadequate number of viral copies (<131 copies/mL). A negative result must be combined with clinical observations, patient history, and epidemiological information. The expected result is Negative. Fact Sheet for Patients:  PinkCheek.be Fact Sheet for Healthcare Providers:  GravelBags.it This test is not yet ap proved or cleared by the [place] FDA and  has been authorized for detection and/or diagnosis of SARS-CoV-2 by FDA under an Emergency Use Authorization (EUA). This EUA will remain  in effect (meaning this test can be used) for the duration of the COVID-19 declaration under Section 564(b)(1) of the Act, 21 U.S.C. section 360bbb-3(b)(1), unless the authorization is terminated or revoked sooner.    Influenza A by PCR NEGATIVE NEGATIVE Final   Influenza B by PCR NEGATIVE NEGATIVE Final    Comment: (NOTE) The Xpert Xpress SARS-CoV-2/FLU/RSV assay is intended as an aid in  the diagnosis of influenza from Nasopharyngeal swab specimens and  should not be used as a sole basis for treatment. Nasal washings and  aspirates are unacceptable for Xpert Xpress SARS-CoV-2/FLU/RSV  testing. Fact Sheet for Patients: PinkCheek.be Fact Sheet for Healthcare Providers: GravelBags.it This test is not yet approved or cleared by the [place] FDA and  has been authorized for detection and/or diagnosis of SARS-CoV-2 by  FDA under an Emergency Use Authorization (EUA). This EUA will remain  in effect (meaning this test can be  used) for the duration of the  Covid-19 declaration under Section 564(b)(1) of the Act, 21  U.S.C. section 360bbb-3(b)(1), unless the authorization is  terminated or revoked. Performed at [facility], [place] [address], [place],  [address]      Radiological Exams on Admission: DG  Chest Port 1 View  Result Date: 07/15/2019 CLINICAL DATA:  Dialysis patient EXAM: PORTABLE CHEST 1 VIEW COMPARISON:  06/08/2019 FINDINGS: Cardiomegaly. Aortic atherosclerosis and extensive vascular calcinosis. Minimal, diffuse interstitial opacity. Unchanged elevation of the left hemidiaphragm the visualized skeletal structures are unremarkable. IMPRESSION: Cardiomegaly with minimal, diffuse interstitial opacity, likely edema. No focal airspace opacity. Electronically Signed   By: Eddie Candle M.D.   On: 07/15/2019 17:07    EKG: Independently reviewed. Sinus rhythm, 1st degree AVB, chronic LBBB, QTc 548 ms.   Assessment/Plan   1. Hyperkalemia; pulmonary edema; ESRD  - Presents with abdominal distension and SOB after missing HD, and is found to have potassium of 7.0, severe HTN, mild pulm edema without resp distress or hypoxia   - EKG with 1st degree AVB, chronic LBBB, QTc 548 ms  - Treated with kayexalate and calcium in ED  - Nephrology consulting and much appreciated; due to high volume, will likely not be able to dialyze [name] until am  - Repeat potassium needed prior to patient going upstairs, may need additional temporizing medications  - Continue cardiac monitoring, serial potassium levels    2. Cirrhosis with ascites   - Attributed to hepatitis B and C, has hx of SBP  - Abd distended on admission but soft and non tender, had therapeutic paracentesis on 12/28   - Continue PPI, lactulose; he has been on Bactrim for SBP ppx but will hold tonight in light of hyperkalemia   3. Paroxysmal atrial fibrillation - In sinus rhythm on admission  - Not anticoagulated due to GIB history and non-compliance  - Continue metoprolol as tolerated    4. COPD  - No wheezing on admission  - Patient complains of SOB, likely secondary to pulmonary edema, not in any distress on admission  - Continue inhalers   5. Anemia  - Hgb 9.4 on admission with MCV 77.4  - Appears stable, no active bleeding     DVT prophylaxis: sq heparin   Code Status: DNR  Family Communication: Discussed with patient  Consults called: Nephrology Admission status: Inpatient     Vianne Bulls, MD [facility] Pager [phone]  If 7PM-7AM, please contact night-coverage [email or web address] Password [facility]  07/15/2019, 7:35 PM

## 2019-07-15 NOTE — ED Notes (Addendum)
Pt stated that he was having a hard time breathing. Pt's O2 checked. Pt at 100% O2 on room air. Pt asking for oxygen. Informed Jamie - Agricultural consultant and Santiago Glad - Triage RN. Pt's vitals reassessed.

## 2019-07-15 NOTE — ED Notes (Signed)
Pt refused to be stuck and asked if blood could be drawn from the IV.

## 2019-07-15 NOTE — ED Provider Notes (Signed)
[facility] EMERGENCY DEPARTMENT Provider Note   CSN: [id number] Arrival date & time: 07/15/19  1059     History Chief Complaint  Patient presents with  . Abdominal Pain    [name] is a 59 y.o. male.  Patient is a dialysis patient.  Normally dialyzed Monday Wednesdays and Fridays.  But due to holiday schedule was due to be dialyzed yesterday.  He missed that he was here.  He had an elevated potassium at 6.3.  Nephrology was planning on dialyzing him.  But patient left AMA from the emergency department.  Patient's potassium was treated with some insulin and sugar but his blood sugar dropped.  But he did eat and that was confirmed to be back up.  Patient has a known history of cirrhosis and ascites.  Does get outpatient paracentesis.  Patient with complaint of abdominal pain yesterday and again today.  No leukocytosis yesterday no significant abdominal tenderness today were not worried about spontaneous peritonitis.  Plus patient is on Bactrim on a regular basis.        Past Medical History:  Diagnosis Date  . Anemia   . Anxiety   . Arthritis    left shoulder  . Atherosclerosis of aorta ([place])   . Cardiomegaly   . Chest pain    DATE UNKNOWN, C/O PERIODICALLY  . Cocaine abuse ([place])   . COPD exacerbation ([place]) 08/17/2016  . Coronary artery disease    stent 02/22/17  . ESRD (end stage renal disease) on dialysis ([place])    "[name]; MWF" (07/04/2017)  . GERD (gastroesophageal reflux disease)    DATE UNKNOWN  . Hemorrhoids   . Hepatitis B, chronic ([place])   . Hepatitis C   . History of kidney stones   . Hyperkalemia   . Hypertension   . Kidney failure   . Metabolic bone disease    Patient denies  . Mitral stenosis   . Myocardial infarction ([place])   . Pneumonia   . Pulmonary edema   . Solitary rectal ulcer syndrome 07/2017   at [facility] for rectal bleeding  . Tubular adenoma of colon     Patient Active Problem List   Diagnosis Date Noted    . Dyspnea 06/09/2019  . Acquired thrombophilia ([place]) 06/05/2019  . A-fib ([place]) 05/30/2019  . Atrial fibrillation with RVR ([name]) 05/29/2019  . Melena   . Pressure injury of skin 03/09/2019  . Abdominal distention   . Volume overload 12/28/2018  . Sepsis ([place]) 09/12/2018  . Atherosclerosis of native coronary artery of native heart without angina pectoris 03/11/2018  . Benign neoplasm of cecum   . Benign neoplasm of ascending colon   . Benign neoplasm of descending colon   . Benign neoplasm of rectum   . Paroxysmal atrial fibrillation ([place]) 01/23/2018  . Hx of colonic polyps 01/20/2018  . ESRD (end stage renal disease) ([place]) 11/21/2017  . GERD (gastroesophageal reflux disease) 11/16/2017  . Liver cirrhosis ([address]) 11/15/2017  . DNR (do not resuscitate)   . Palliative care by specialist   . Hyponatremia 11/04/2017  . SBP (spontaneous bacterial peritonitis) ([place]) 10/30/2017  . Liver disease, chronic 10/30/2017  . SOB (shortness of breath)   . Abdominal pain 10/28/2017  . Upper airway cough syndrome with flattening on f/v loop 10/13/17 c/w vcd 10/17/2017  . Elevated diaphragm 10/13/2017  . Ileus ([place]) 09/29/2017  . Malnutrition of moderate degree 09/29/2017  . Sinus congestion 09/03/2017  . Symptomatic anemia 09/02/2017  .  Other cirrhosis of liver ([place]) 09/02/2017  . Left bundle branch block 09/02/2017  . Mitral stenosis 09/02/2017  . Hematochezia 07/15/2017  . Wide-complex tachycardia ([place])   . Endotracheally intubated   . ESRD on dialysis ([place]) 07/04/2017  . Acute respiratory failure with hypoxia ([place]) 06/18/2017  . CKD (chronic kidney disease) stage V requiring chronic dialysis ([place]) 06/18/2017  . History of Cocaine abuse ([place]) 06/18/2017  . Hypertension 06/18/2017  . Infection of AV graft for dialysis ([place]) 06/18/2017  . Anxiety 06/18/2017  . Anemia due to chronic kidney disease 06/18/2017  . Atypical atrial flutter ([place]) 06/18/2017  . Personality disorder ([place]) 06/13/2017   . Cellulitis 06/12/2017  . Adjustment disorder with mixed anxiety and depressed mood 06/10/2017  . Suicidal ideation 06/10/2017  . Arm wound, left, sequela 06/10/2017  . Dyspnea on exertion 05/29/2017  . Tachycardia 05/29/2017  . Hyperkalemia 05/22/2017  . Acute metabolic encephalopathy   . Anemia 04/23/2017  . Ascites 04/23/2017  . COPD (chronic obstructive pulmonary disease) ([place]) 04/23/2017  . Acute on chronic respiratory failure with hypoxia ([place]) 03/25/2017  . Arrhythmia 03/25/2017  . COPD GOLD 0 with flattening on inps f/v  09/27/2016  . Essential hypertension 09/27/2016  . Fluid overload 08/30/2016  . COPD exacerbation ([place]) 08/17/2016  . Hypertensive urgency 08/17/2016  . Respiratory failure ([place]) 08/17/2016  . Problem with dialysis access ([place]) 07/23/2016  . Chronic hepatitis B ([place]) 03/05/2014  . Chronic hepatitis C without hepatic coma ([place]) 03/05/2014  . Internal hemorrhoids with bleeding, swelling and itching 03/05/2014  . Thrombocytopenia ([place]) 03/05/2014  . Chest pain 02/27/2014  . Alcohol abuse 04/14/2009  . Cigarette smoker 04/14/2009  . GANGLION CYST 04/14/2009    Past Surgical History:  Procedure Laterality Date  . A/V FISTULAGRAM Left 05/26/2017   Procedure: A/V FISTULAGRAM;  Surgeon: Conrad Abrams, MD;  Location: [facility];  Service: Cardiovascular;  Laterality: Left;  . A/V FISTULAGRAM Right 11/18/2017   Procedure: A/V FISTULAGRAM - Right Arm;  Surgeon: Elam Dutch, MD;  Location: [facility];  Service: Cardiovascular;  Laterality: Right;  . APPLICATION OF WOUND VAC Left 06/14/2017   Procedure: APPLICATION OF WOUND VAC;  Surgeon: Katha Cabal, MD;  Location: [facility];  Service: Vascular;  Laterality: Left;  . AV FISTULA PLACEMENT  2012   BELIEVED WAS PLACED IN JUNE  . AV FISTULA PLACEMENT Right 08/09/2017   Procedure: Creation Right arm ARTERIOVENOUS BRACHIOCEPOHALIC FISTULA;  Surgeon: Elam Dutch, MD;  Location: [facility];   Service: Vascular;  Laterality: Right;  . AV FISTULA PLACEMENT Right 11/22/2017   Procedure: INSERTION OF ARTERIOVENOUS (AV) GORE-TEX GRAFT RIGHT UPPER ARM;  Surgeon: Elam Dutch, MD;  Location: [facility];  Service: Vascular;  Laterality: Right;  . BIOPSY  01/25/2018   Procedure: BIOPSY;  Surgeon: Jerene Bears, MD;  Location: [facility];  Service: Gastroenterology;;  . BIOPSY  04/10/2019   Procedure: BIOPSY;  Surgeon: Jerene Bears, MD;  Location: [facility];  Service: Gastroenterology;;  . COLONOSCOPY    . COLONOSCOPY WITH PROPOFOL N/A 01/25/2018   Procedure: COLONOSCOPY WITH PROPOFOL;  Surgeon: Jerene Bears, MD;  Location: [facility];  Service: Gastroenterology;  Laterality: N/A;  . CORONARY STENT INTERVENTION N/A 02/22/2017   Procedure: CORONARY STENT INTERVENTION;  Surgeon: Nigel Mormon, MD;  Location: [facility];  Service: Cardiovascular;  Laterality: N/A;  . ESOPHAGOGASTRODUODENOSCOPY (EGD) WITH PROPOFOL N/A 01/25/2018   Procedure: ESOPHAGOGASTRODUODENOSCOPY (EGD) WITH PROPOFOL;  Surgeon: Hilarie Fredrickson,  Lajuan Lines, MD;  Location: [facility];  Service: Gastroenterology;  Laterality: N/A;  . ESOPHAGOGASTRODUODENOSCOPY (EGD) WITH PROPOFOL N/A 04/10/2019   Procedure: ESOPHAGOGASTRODUODENOSCOPY (EGD) WITH PROPOFOL;  Surgeon: Jerene Bears, MD;  [facility]: [facility];  Service: Gastroenterology;  Laterality: N/A;  . FLEXIBLE SIGMOIDOSCOPY N/A 07/15/2017   Procedure: FLEXIBLE SIGMOIDOSCOPY;  Surgeon: Carol Ada, MD;  Location: [facility];  Service: Endoscopy;  Laterality: N/A;  . HEMORRHOID BANDING    . I & D EXTREMITY Left 06/01/2017   Procedure: IRRIGATION AND DEBRIDEMENT LEFT ARM HEMATOMA WITH LIGATION OF LEFT ARM AV FISTULA;  Surgeon: Elam Dutch, MD;  Location: [facility];  Service: Vascular;  Laterality: Left;  . I & D EXTREMITY Left 06/14/2017   Procedure: IRRIGATION AND DEBRIDEMENT EXTREMITY;  Surgeon: Katha Cabal, MD;  Location: [facility];  Service: Vascular;   Laterality: Left;  . INSERTION OF DIALYSIS CATHETER  05/30/2017  . INSERTION OF DIALYSIS CATHETER N/A 05/30/2017   Procedure: INSERTION OF DIALYSIS CATHETER;  Surgeon: Elam Dutch, MD;  Location: [facility];  Service: Vascular;  Laterality: N/A;  . IR PARACENTESIS  08/30/2017  . IR PARACENTESIS  09/29/2017  . IR PARACENTESIS  10/28/2017  . IR PARACENTESIS  11/09/2017  . IR PARACENTESIS  11/16/2017  . IR PARACENTESIS  11/28/2017  . IR PARACENTESIS  12/01/2017  . IR PARACENTESIS  12/06/2017  . IR PARACENTESIS  01/03/2018  . IR PARACENTESIS  01/23/2018  . IR PARACENTESIS  02/07/2018  . IR PARACENTESIS  02/21/2018  . IR PARACENTESIS  03/06/2018  . IR PARACENTESIS  03/17/2018  . IR PARACENTESIS  04/04/2018  . IR PARACENTESIS  12/28/2018  . IR PARACENTESIS  01/08/2019  . IR PARACENTESIS  01/23/2019  . IR PARACENTESIS  02/01/2019  . IR PARACENTESIS  02/19/2019  . IR PARACENTESIS  03/01/2019  . IR PARACENTESIS  03/15/2019  . IR PARACENTESIS  04/03/2019  . IR PARACENTESIS  04/12/2019  . IR PARACENTESIS  05/01/2019  . IR PARACENTESIS  05/08/2019  . IR PARACENTESIS  05/24/2019  . IR PARACENTESIS  06/12/2019  . IR PARACENTESIS  07/09/2019  . IR RADIOLOGIST EVAL & MGMT  02/14/2018  . IR RADIOLOGIST EVAL & MGMT  02/22/2019  . LEFT HEART CATH AND CORONARY ANGIOGRAPHY N/A 02/22/2017   Procedure: LEFT HEART CATH AND CORONARY ANGIOGRAPHY;  Surgeon: Nigel Mormon, MD;  Location: [facility];  Service: Cardiovascular;  Laterality: N/A;  . LIGATION OF ARTERIOVENOUS  FISTULA Left 01/11/4286   Procedure: Plication of Left Arm Arteriovenous Fistula;  Surgeon: Elam Dutch, MD;  Location: [facility];  Service: Vascular;  Laterality: Left;  . POLYPECTOMY    . POLYPECTOMY  01/25/2018   Procedure: POLYPECTOMY;  Surgeon: Jerene Bears, MD;  Location: [facility];  Service: Gastroenterology;;  . REVISON OF ARTERIOVENOUS FISTULA Left 6/81/1572   Procedure: PLICATION OF DISTAL ANEURYSMAL SEGEMENT OF LEFT UPPER ARM  ARTERIOVENOUS FISTULA;  Surgeon: Elam Dutch, MD;  Location: [facility];  Service: Vascular;  Laterality: Left;  . REVISON OF ARTERIOVENOUS FISTULA Left 12/29/3557   Procedure: Plication of Left Upper Arm Fistula ;  Surgeon: Waynetta Sandy, MD;  Location: [facility];  Service: Vascular;  Laterality: Left;  . SKIN GRAFT SPLIT THICKNESS LEG / FOOT Left    SKIN GRAFT SPLIT THICKNESS LEFT ARM DONOR SITE: LEFT ANTERIOR THIGH  . SKIN SPLIT GRAFT Left 07/04/2017   Procedure: SKIN GRAFT SPLIT THICKNESS LEFT ARM DONOR SITE: LEFT ANTERIOR THIGH;  Surgeon: Elam Dutch,  MD;  Location: [facility];  Service: Vascular;  Laterality: Left;  . THROMBECTOMY W/ EMBOLECTOMY Left 06/05/2017   Procedure: EXPLORATION OF LEFT ARM FOR BLEEDING; OVERSEWED PROXIMAL FISTULA;  Surgeon: Angelia Mould, MD;  Location: [facility];  Service: Vascular;  Laterality: Left;  . WOUND EXPLORATION Left 06/03/2017   Procedure: WOUND EXPLORATION WITH WOUND VAC APPLICATION TO LEFT ARM;  Surgeon: Angelia Mould, MD;  Location: [facility] OR;  Service: Vascular;  Laterality: Left;       Family History  Problem Relation Age of Onset  . Heart disease Mother   . Lung cancer Mother   . Heart disease Father   . Malignant hyperthermia Father   . COPD Father   . Throat cancer Sister   . Esophageal cancer Sister   . Hypertension Other   . COPD Other   . Colon cancer Neg Hx   . Colon polyps Neg Hx   . Rectal cancer Neg Hx   . Stomach cancer Neg Hx     Social History   Tobacco Use  . Smoking status: Current Every Day Smoker    Packs/day: 0.50    Years: 43.00    Pack years: 21.50    Types: Cigarettes    Start date: 08/13/1973  . Smokeless tobacco: Never Used  . Tobacco comment: i dont know i just make   Substance Use Topics  . Alcohol use: Not Currently    Comment: quit drinking in 2017  . Drug use: Not Currently    Types: Marijuana, Cocaine    Comment: reports using once every 3 months, 04-06-2019 was this     Home  Medications Prior to Admission medications   Medication Sig Start Date End Date Taking? Authorizing Provider  albuterol (VENTOLIN HFA) 108 (90 Base) MCG/ACT inhaler Inhale 2 puffs into the lungs every 6 (six) hours as needed for wheezing or shortness of breath.    [provider]  gabapentin (NEURONTIN) 100 MG capsule Take 100 mg by mouth 2 (two) times daily. 07/03/19   [provider]  hydrALAZINE (APRESOLINE) 100 MG tablet Take 0.5 tablets (50 mg total) by mouth 3 (three) times daily. 05/31/19   [place], Carley D, DO  lactulose (CHRONULAC) 10 GM/15ML solution TAKE 45 MLS BY MOUTH 2 TIMES DAILY. Patient taking differently: Take 30 g by mouth 2 (two) times daily.  06/04/19   Al Decant, MD  loperamide (IMODIUM A-D) 2 MG tablet Take 2 mg by mouth 4 (four) times daily as needed for diarrhea or loose stools.    [provider]  metoprolol tartrate (LOPRESSOR) 100 MG tablet Take 1 tablet (100 mg total) by mouth 2 (two) times daily. 06/04/19   Al Decant, MD  montelukast (SINGULAIR) 10 MG tablet Take 10 mg by mouth every evening.     [provider]  ondansetron (ZOFRAN) 8 MG tablet Take 8 mg by mouth every 4 (four) hours as needed. 06/14/19   [provider]  pantoprazole (PROTONIX) 40 MG tablet Take 1 tablet (40 mg total) by mouth daily. Patient taking differently: Take 40 mg by mouth See admin instructions. Take 40 mg by mouth in the morning before breakfast and an additional 40 mg once a day, if symptoms persist 06/05/19   Maudie Mercury, MD  sevelamer carbonate (RENVELA) 800 MG tablet Take 4 tablets with meals  And 2 tablets twice daily with snacks Patient taking differently: Take 1,600-2,400 mg by mouth See admin instructions. Take 2,400 mg by mouth three times a  day with meals and 1,600 mg with each snack 01/22/18   Patrecia Pour, MD  sulfamethoxazole-trimethoprim (BACTRIM DS) 800-160 MG tablet Take 1 tablet by mouth daily. Patient taking  differently: Take 1 tablet by mouth 2 (two) times daily.  12/30/18   Geradine Girt, DO  SYMBICORT 160-4.5 MCG/ACT inhaler Inhale 2 puffs into the lungs 2 (two) times daily.  11/30/18   [provider]  zolpidem (AMBIEN) 10 MG tablet Take 10 mg by mouth at bedtime as needed for sleep.    [provider]    Allergies    Morphine and related, Aspirin, Clonidine derivatives, Tramadol, and Tylenol [acetaminophen]  Review of Systems   Review of Systems  Constitutional: Negative for chills and fever.  HENT: Negative for congestion, rhinorrhea and sore throat.   Eyes: Negative for visual disturbance.  Respiratory: Negative for cough and shortness of breath.   Cardiovascular: Negative for chest pain and leg swelling.  Gastrointestinal: Positive for abdominal distention and abdominal pain. Negative for diarrhea, nausea and vomiting.  Genitourinary: Negative for dysuria.  Musculoskeletal: Negative for back pain and neck pain.  Skin: Negative for rash.  Neurological: Negative for dizziness, light-headedness and headaches.  Hematological: Does not bruise/bleed easily.  Psychiatric/Behavioral: Negative for confusion.    Physical Exam Updated Vital Signs BP (!) 199/103 (BP Location: Left Arm)   Pulse 66   Temp 98 F (36.7 C) (Oral)   Resp 20   SpO2 100%   Physical Exam Vitals and nursing note reviewed.  Constitutional:      Appearance: Normal appearance. He is well-developed.  HENT:     Head: Normocephalic and atraumatic.  Eyes:     Extraocular Movements: Extraocular movements intact.     Conjunctiva/sclera: Conjunctivae normal.     Pupils: Pupils are equal, round, and reactive to light.  Cardiovascular:     Rate and Rhythm: Normal rate and regular rhythm.     Heart sounds: No murmur.  Pulmonary:     Effort: Pulmonary effort is normal. No respiratory distress.     Breath sounds: Normal breath sounds.  Abdominal:     General: There is distension.     Palpations:  Abdomen is soft.     Tenderness: There is no abdominal tenderness. There is no guarding.     Comments: Abdomen distended but no significant tenderness.  No guarding.  Musculoskeletal:        General: Normal range of motion.     Cervical back: Normal range of motion and neck supple.     Comments: AV fistulas in both upper arms but the cart and wanting uses in his right upper arm good thrill.  Skin:    General: Skin is warm and dry.  Neurological:     General: No focal deficit present.     Mental Status: He is alert and oriented to person, place, and time.     ED Results / Procedures / Treatments   Labs (all labs ordered are listed, but only abnormal results are displayed) Labs Reviewed  COMPREHENSIVE METABOLIC PANEL - Abnormal; Notable for the following components:      Result Value   Sodium 133 (*)    Potassium 7.0 (*)    Chloride 94 (*)    BUN 53 (*)    Creatinine, Ser 10.82 (*)    Total Protein 6.4 (*)    GFR calc non Af Amer 5 (*)    GFR calc Af Amer 5 (*)    Anion gap  17 (*)    All other components within normal limits  CBC - Abnormal; Notable for the following components:   RBC 3.41 (*)    Hemoglobin 9.2 (*)    HCT 26.4 (*)    MCV 77.4 (*)    All other components within normal limits  LIPASE, BLOOD  URINALYSIS, ROUTINE W REFLEX MICROSCOPIC    EKG EKG Interpretation  Date/Time:  Sunday July 15 2019 11:14:01 EST Ventricular Rate:  68 PR Interval:  222 QRS Duration: 180 QT Interval:  516 QTC Calculation: 548 R Axis:   141 Text Interpretation: Sinus rhythm with 1st degree A-V block Right axis deviation Left bundle branch block Abnormal ECG Confirmed by Fredia Sorrow [phone]) on 07/15/2019 4:02:26 PM   Radiology No results found.  Procedures Procedures (including critical care time)  CRITICAL CARE Performed by: Fredia Sorrow Total critical care time: 30 minutes Critical care time was exclusive of separately billable procedures and treating other  patients. Critical care was necessary to treat or prevent imminent or life-threatening deterioration. Critical care was time spent personally by me on the following activities: development of treatment plan with patient and/or surrogate as well as nursing, discussions with consultants, evaluation of patient's response to treatment, examination of patient, obtaining history from patient or surrogate, ordering and performing treatments and interventions, ordering and review of laboratory studies, ordering and review of radiographic studies, pulse oximetry and re-evaluation of patient's condition.   Medications Ordered in ED Medications  sodium chloride flush (NS) 0.9 % injection 3 mL (has no administration in time range)  calcium gluconate 1 g in sodium chloride 0.9 % 100 mL IVPB (has no administration in time range)    ED Course  I have reviewed the triage vital signs and the nursing notes.  Pertinent labs & imaging results that were available during my care of the patient were reviewed by me and considered in my medical decision making (see chart for details).    MDM Rules/Calculators/A&P                      Patient's potassium done in the waiting area was 7.  Patient's EKG has a left bundle branch block pattern no significant change.  Patient vital signs without significant abnormality.  Discussed with nephrology.  They will dialyze but it probably will not be till first thing in the morning.  I had ordered calcium gluconate IV to help stabilize the heart from the elevated potassium and at the request of nephrology ordered Kayexalate.  Potassium was repeated.  Had come down to 6.6 which shows signs of little bit improvement.  Patient seems to be tolerating elevated potassiums pretty well.  Chest x-ray with some mild edema but nothing significant.  Nephrology also agreed with the plan just for the Kayexalate.  Hospitalist service will admit.   Final Clinical Impression(s) / ED  Diagnoses Final diagnoses:  Hyperkalemia  ESRD on dialysis [facility])    Rx / DC Orders ED Discharge Orders    None       Fredia Sorrow, MD 07/15/19 2202

## 2019-07-15 NOTE — ED Triage Notes (Addendum)
Pt to triage via [facility] from home.  Seen in ED yesterday. C/o generalized abd pain with distended abd.  Reports nausea and no vomiting.  Missed dialysis on Friday.  Pt states " I wouldn't let them do dialysis yesterday."

## 2019-07-16 LAB — BASIC METABOLIC PANEL
Anion gap: 17 — ABNORMAL HIGH (ref 5–15)
BUN: 56 mg/dL — ABNORMAL HIGH (ref 6–20)
CO2: 24 mmol/L (ref 22–32)
Calcium: 9.9 mg/dL (ref 8.9–10.3)
Chloride: 94 mmol/L — ABNORMAL LOW (ref 98–111)
Creatinine, Ser: 11.49 mg/dL — ABNORMAL HIGH (ref 0.61–1.24)
GFR calc Af Amer: 5 mL/min — ABNORMAL LOW (ref >60)
GFR calc non Af Amer: 4 mL/min — ABNORMAL LOW (ref >60)
Glucose, Bld: 87 mg/dL (ref 70–99)
Potassium: 6.3 mmol/L (ref 3.5–5.1)
Sodium: 135 mmol/L (ref 135–145)

## 2019-07-16 LAB — POTASSIUM
Potassium: 4.2 mmol/L (ref 3.5–5.1)
Potassium: 4.7 mmol/L (ref 3.5–5.1)

## 2019-07-16 LAB — CBC
HCT: 27.5 % — ABNORMAL LOW (ref 39.0–52.0)
Hemoglobin: 9.6 g/dL — ABNORMAL LOW (ref 13.0–17.0)
MCH: 27.1 pg (ref 26.0–34.0)
MCHC: 34.9 g/dL (ref 30.0–36.0)
MCV: 77.7 fL — ABNORMAL LOW (ref 80.0–100.0)
Platelets: 167 10*3/uL (ref 150–400)
RBC: 3.54 MIL/uL — ABNORMAL LOW (ref 4.22–5.81)
RDW: 14.2 % (ref 11.5–15.5)
WBC: 8.2 10*3/uL (ref 4.0–10.5)
nRBC: 0 % (ref 0.0–0.2)

## 2019-07-16 MED ORDER — SODIUM CHLORIDE 0.9 % IV SOLN: 100.0000 mL | INTRAVENOUS | Status: DC | PRN

## 2019-07-16 MED ORDER — ALBUTEROL SULFATE (2.5 MG/3ML) 0.083% IN NEBU: 2.5000 mg | INHALATION_SOLUTION | RESPIRATORY_TRACT | Status: DC | PRN

## 2019-07-16 MED ORDER — LIDOCAINE HCL (PF) 1 % IJ SOLN: 5.0000 mL | INTRAMUSCULAR | Status: DC | PRN

## 2019-07-16 MED ORDER — LIDOCAINE-PRILOCAINE 2.5-2.5 % EX CREA: 1.0000 "application " | TOPICAL_CREAM | CUTANEOUS | Status: DC | PRN

## 2019-07-16 MED ORDER — LABETALOL HCL 5 MG/ML IV SOLN
10.0000 mg | INTRAVENOUS | Status: DC | PRN
  Administered 2019-07-16: 10 mg via INTRAVENOUS
  Filled 2019-07-16: qty 4

## 2019-07-16 MED ORDER — PENTAFLUOROPROP-TETRAFLUOROETH EX AERO: 1.0000 "application " | INHALATION_SPRAY | CUTANEOUS | Status: DC | PRN

## 2019-07-16 NOTE — Procedures (Addendum)
Patient was seen on dialysis and the procedure was supervised.  BFR 400  Via R AVG BP is  183/100 .  Patient appears to be tolerating treatment well. Potassium expect to improve after dialysis today.  I discussed about low potassium diet. Okay to discharge from renal perspective.  Next dialysis on Wednesday to resume at [facility].  [name] Tanna Furry 07/16/2019

## 2019-07-16 NOTE — Progress Notes (Signed)
Pt signed himself out AMA and left the hospital.  MD notified.

## 2019-07-16 NOTE — Progress Notes (Signed)
PROGRESS NOTE   Magdaleno Lortie  XHB:[id number]    DOB: 1961-06-02    DOA: 07/15/2019  PCP: [name], FNP   I have briefly reviewed patients previous medical records in [facility].  Chief Complaint:   Chief Complaint  Patient presents with  . Abdominal Pain    Brief Narrative:  59 year old male with PMH of hepatitis B and C with decompensated cirrhosis, ascites requiring regular paracentesis, ESRD on HD, HTN, chronic anemia, COPD, MI, PAF, medical noncompliance, Mrs. Dialysis, recently in ED with hyperkalemia at which time nephrology consulted and planned inpatient dialysis but patient left AMA, presented with complaints of abdominal distention and dyspnea.  BP 200/100 in ED.  Potassium 7.    Assessment & Plan:  Principal Problem:   Hyperkalemia Active Problems:   COPD (chronic obstructive pulmonary disease) (HCC)   Anemia due to chronic kidney disease   Other cirrhosis of liver (HCC)   QT prolongation   ESRD (end stage renal disease) (HCC)   Paroxysmal atrial fibrillation (HCC)   Volume overload   Hyperkalemia  Likely secondary to missing HD.  Given Lokelma and IV calcium in ED and Kayexalate.  EKG with chronic LBBB unchanged to prior.  Nephrology consulted, unable to dialyze last night due to high volume of patients but underwent HD this morning.  Cleared from nephrology perspective for discharge, continue outpatient HD.  ESRD on HD  Nephrology consulted and management as above.  Acute on chronic diastolic CHF  Complicated by ESRD with noncompliance to HD and by cirrhosis.  Volume management across HD.  Cardiology consulted and will defer to them regarding repeating TTE.  Hypertensive urgency  Complicated by noncompliance and volume overload.  Continue prior home dose of metoprolol 100 mg twice daily and hydralazine 50 mg 3 times daily.   Blood pressures widely fluctuating.  Monitor closely.  Cirrhosis with ascites  Attributed to hepatitis  B and C.  Has history of SBP.  Abdomen mildly distended but no indications for urgent paracentesis.  Last had paracentesis on 12/28.  Continue PPI, lactulose, Bactrim for SBP prophylaxis at discharge.  Bactrim held overnight due to hyperkalemia.  Patient refuses many of his medications.  Paroxysmal A. fib with RVR  Reportedly in sinus rhythm on admission.  Briefly this morning, wide-complex tachycardia in the 150s which may be his A. fib with RVR but cannot rule out NSVT.  Subsequently reverted to rate controlled wide-complex, A. Fib.  Continue metoprolol.  Likely not a candidate for anticoagulation due to noncompliance and GI bleed.  COPD  No clinical bronchospasm.  Anemia of chronic kidney disease and chronic disease.  Stable.  Hypoglycemia  Noted on 1/2.  Monitor CBGs.   DVT prophylaxis: Subcutaneous heparin Code Status: DNR Family Communication: None at bedside Disposition: DC home pending clinical improvement.   Consultants:   Cardiology/Dr. Will Bonnet. Nephrology.  Procedures:   HD 1/4  Antimicrobials:   None.   Subjective:  Patient interviewed and examined along with his RN in room.  Seen after HD this morning.  Poor historian.  "Hungry".  Reports some dyspnea and palpitations after just returning from the bathroom.  No chest pain, dizziness or lightheadedness.  Coincided with heart rate of 150 on telemetry.  Objective:   Vitals:   07/16/19 1140 07/16/19 1141 07/16/19 1144 07/16/19 1207  BP: (!) 170/94 90/67    Pulse:  (!) 154    Resp: 17 16 20 16   Temp:  (!) 97.4 F (36.3 C)  TempSrc:  Oral    SpO2: 99% 100% 100%   Weight:      Height:        General exam: Young male, moderately built, thinly nourished ambulating comfortably in the room. Respiratory system: Occasional basal crackles but otherwise clear to auscultation Cardiovascular system: S1 & S2 heard, irregularly irregular. No JVD, murmurs, rubs, gallops or clicks. No pedal edema.   Telemetry personally reviewed: Wide-complex QRS tachycardia in the 150s which abruptly reverted to controlled ventricular rate in the 70s-80s wide-complex.  Suspect A. fib. Gastrointestinal system: Abdomen is mildly distended, soft and nontender. No organomegaly or masses felt. Normal bowel sounds heard. Central nervous system: Alert and oriented. No focal neurological deficits. Extremities: Symmetric 5 x 5 power. Skin: No rashes, lesions or ulcers Psychiatry: Judgement and insight appear impaired. Mood & affect appropriate.     Data Reviewed:   I have personally reviewed following labs and imaging studies   CBC: Recent Labs  Lab 07/14/19 1555 07/15/19 1115 07/16/19 0454  WBC 9.3 8.1 8.2  HGB 9.8* 9.2* 9.6*  HCT 28.0* 26.4* 27.5*  MCV 78.4* 77.4* 77.7*  PLT 175 155 546    Basic Metabolic Panel: Recent Labs  Lab 07/15/19 1115 07/15/19 2019 07/16/19 0454 07/16/19 1250  NA 133* 130* 135  --   K 7.0* 6.6* 6.3* 4.2  CL 94* 92* 94*  --   CO2 22 23 24   --   GLUCOSE 87 90 87  --   BUN 53* 56* 56*  --   CREATININE 10.82* 11.61* 11.49*  --   CALCIUM 9.9 10.2 9.9  --     Liver Function Tests: Recent Labs  Lab 07/14/19 1555 07/15/19 1115  AST 26 25  ALT 14 11  ALKPHOS 94 79  BILITOT 0.6 0.9  PROT 6.7 6.4*  ALBUMIN 3.5 3.7    CBG: Recent Labs  Lab 07/14/19 2008 07/14/19 2049 07/15/19 2114  GLUCAP 33* 63* 72    Microbiology Studies:   Recent Results (from the past 240 hour(s))  Respiratory Panel by RT PCR (Flu A&B, Covid) - Nasopharyngeal Swab     Status: None   Collection Time: 07/14/19  8:04 PM   Specimen: Nasopharyngeal Swab  Result Value Ref Range Status   SARS Coronavirus 2 by RT PCR NEGATIVE NEGATIVE Final    Comment: (NOTE) SARS-CoV-2 target nucleic acids are NOT DETECTED. The SARS-CoV-2 RNA is generally detectable in upper respiratoy specimens during the acute phase of infection. The lowest concentration of SARS-CoV-2 viral copies this assay can  detect is 131 copies/mL. A negative result does not preclude SARS-Cov-2 infection and should not be used as the sole basis for treatment or other patient management decisions. A negative result may occur with  improper specimen collection/handling, submission of specimen other than nasopharyngeal swab, presence of viral mutation(s) within the areas targeted by this assay, and inadequate number of viral copies (<131 copies/mL). A negative result must be combined with clinical observations, patient history, and epidemiological information. The expected result is Negative. Fact Sheet for Patients:  PinkCheek.be Fact Sheet for Healthcare Providers:  GravelBags.it This test is not yet ap proved or cleared by the [place] FDA and  has been authorized for detection and/or diagnosis of SARS-CoV-2 by FDA under an Emergency Use Authorization (EUA). This EUA will remain  in effect (meaning this test can be used) for the duration of the COVID-19 declaration under Section 564(b)(1) of the Act, 21 U.S.C. section 360bbb-3(b)(1), unless the authorization is terminated  or revoked sooner.    Influenza A by PCR NEGATIVE NEGATIVE Final   Influenza B by PCR NEGATIVE NEGATIVE Final    Comment: (NOTE) The Xpert Xpress SARS-CoV-2/FLU/RSV assay is intended as an aid in  the diagnosis of influenza from Nasopharyngeal swab specimens and  should not be used as a sole basis for treatment. Nasal washings and  aspirates are unacceptable for Xpert Xpress SARS-CoV-2/FLU/RSV  testing. Fact Sheet for Patients: PinkCheek.be Fact Sheet for Healthcare Providers: GravelBags.it This test is not yet approved or cleared by the [place] FDA and  has been authorized for detection and/or diagnosis of SARS-CoV-2 by  FDA under an Emergency Use Authorization (EUA). This EUA will remain  in effect (meaning  this test can be used) for the duration of the  Covid-19 declaration under Section 564(b)(1) of the Act, 21  U.S.C. section 360bbb-3(b)(1), unless the authorization is  terminated or revoked. Performed at [facility], [place] [address], [place],  [address]      Radiology Studies:  DG Chest Port 1 View  Result Date: 07/15/2019 CLINICAL DATA:  Dialysis patient EXAM: PORTABLE CHEST 1 VIEW COMPARISON:  06/08/2019 FINDINGS: Cardiomegaly. Aortic atherosclerosis and extensive vascular calcinosis. Minimal, diffuse interstitial opacity. Unchanged elevation of the left hemidiaphragm the visualized skeletal structures are unremarkable. IMPRESSION: Cardiomegaly with minimal, diffuse interstitial opacity, likely edema. No focal airspace opacity. Electronically Signed   By: Eddie Candle M.D.   On: 07/15/2019 17:07     Scheduled Meds:   . Chlorhexidine Gluconate Cloth  6 each Topical Q0600  . heparin  5,000 Units Subcutaneous Q8H  . hydrALAZINE  50 mg Oral Q8H  . lactulose  30 g Oral BID  . metoprolol tartrate  100 mg Oral BID  . mometasone-formoterol  2 puff Inhalation BID  . sodium chloride flush  3 mL Intravenous Q12H  . sodium chloride flush  3 mL Intravenous Q12H    Continuous Infusions:   . sodium chloride       LOS: 1 day     Vernell Leep, MD, [place], [facility]. [facility]    To contact the attending provider between 7A-7P or the covering provider during after hours 7P-7A, please log into the web site [email or web address] and access using universal [place] password for that web site. If you do not have the password, please call the hospital operator.  07/16/2019, 4:37 PM

## 2019-07-16 NOTE — Progress Notes (Signed)
Patient refused labs and 2200 Lactulose again. Patient did allow RN to administer IV Labetalol 10mg  and 2200 PO Hydralazine 50mg  and PO Metoprolol 100mg . BP 219/93 @ 0107. Patient refused vitals to recheck BP post medication administration.

## 2019-07-16 NOTE — ED Notes (Signed)
ED TO INPATIENT HANDOFF REPORT  ED Nurse Name and Phone #: William Hamburger RN [phone]  S Name/Age/Gender [name] 59 y.o. male Room/Bed: 023C/023C  Code Status   Code Status: DNR  Home/SNF/Other Home Patient oriented to: self, place, time and situation Is this baseline? Yes   Triage Complete: Triage complete  Chief Complaint Hyperkalemia [E87.5]  Triage Note Pt to triage via [facility] from home.  Seen in ED yesterday. C/o generalized abd pain with distended abd.  Reports nausea and no vomiting.  Missed dialysis on Friday.  Pt states " I wouldn't let them do dialysis yesterday."    Allergies Allergies  Allergen Reactions  . Morphine And Related Other (See Comments)    Stomach pain  . Aspirin Other (See Comments)    STOMACH PAIN  . Clonidine Derivatives Itching  . Tramadol Itching  . Tylenol [Acetaminophen] Nausea Only    Stomach ache    Level of Care/Admitting Diagnosis ED Disposition    ED Disposition Condition [facility]: [place] [[id number]]  Level of Care: Progressive [102]  Admit to Progressive based on following criteria: MULTISYSTEM THREATS such as stable sepsis, metabolic/electrolyte imbalance with or without encephalopathy that is responding to early treatment.  Covid Evaluation: Asymptomatic Screening Protocol (No Symptoms)  Diagnosis: Hyperkalemia [419379]  Admitting Physician: Vianne Bulls [[id number]]  Attending Physician: Vianne Bulls [[id number]]  Estimated length of stay: past midnight tomorrow  Certification:: I certify this patient will need inpatient services for at least 2 midnights       B Medical/Surgery History Past Medical History:  Diagnosis Date  . Anemia   . Anxiety   . Arthritis    left shoulder  . Atherosclerosis of aorta ([place])   . Cardiomegaly   . Chest pain    DATE UNKNOWN, C/O PERIODICALLY  . Cocaine abuse ([place])   . COPD exacerbation ([place]) 08/17/2016  . Coronary artery disease    stent 02/22/17  . ESRD (end stage renal disease) on dialysis ([place])    "[name]; MWF" (07/04/2017)  . GERD (gastroesophageal reflux disease)    DATE UNKNOWN  . Hemorrhoids   . Hepatitis B, chronic ([place])   . Hepatitis C   . History of kidney stones   . Hyperkalemia   . Hypertension   . Kidney failure   . Metabolic bone disease    Patient denies  . Mitral stenosis   . Myocardial infarction ([place])   . Pneumonia   . Pulmonary edema   . Solitary rectal ulcer syndrome 07/2017   at flex sig for rectal bleeding  . Tubular adenoma of colon    Past Surgical History:  Procedure Laterality Date  . A/V FISTULAGRAM Left 05/26/2017   Procedure: A/V FISTULAGRAM;  Surgeon: Conrad Louisa, MD;  Location: [place] [facility];  Service: Cardiovascular;  Laterality: Left;  . A/V FISTULAGRAM Right 11/18/2017   Procedure: A/V FISTULAGRAM - Right Arm;  Surgeon: Elam Dutch, MD;  Location: [place] [facility];  Service: Cardiovascular;  Laterality: Right;  . APPLICATION OF WOUND VAC Left 06/14/2017   Procedure: APPLICATION OF WOUND VAC;  Surgeon: Katha Cabal, MD;  Location: [facility];  Service: Vascular;  Laterality: Left;  . AV FISTULA PLACEMENT  2012   BELIEVED WAS PLACED IN JUNE  . AV FISTULA PLACEMENT Right 08/09/2017   Procedure: Creation Right arm ARTERIOVENOUS BRACHIOCEPOHALIC FISTULA;  Surgeon: Elam Dutch, MD;  Location: [place];  Service: Vascular;  Laterality: Right;  . AV FISTULA PLACEMENT Right 11/22/2017   Procedure: INSERTION OF ARTERIOVENOUS (AV) GORE-TEX GRAFT RIGHT UPPER ARM;  Surgeon: Elam Dutch, MD;  Location: [place];  Service: Vascular;  Laterality: Right;  . BIOPSY  01/25/2018   Procedure: BIOPSY;  Surgeon: Jerene Bears, MD;  Location: [place];  Service: Gastroenterology;;  . BIOPSY  04/10/2019   Procedure: BIOPSY;  Surgeon: Jerene Bears, MD;  Location: [facility];  Service: Gastroenterology;;  . COLONOSCOPY    . COLONOSCOPY WITH PROPOFOL N/A 01/25/2018    Procedure: COLONOSCOPY WITH PROPOFOL;  Surgeon: Jerene Bears, MD;  Location: [place];  Service: Gastroenterology;  Laterality: N/A;  . CORONARY STENT INTERVENTION N/A 02/22/2017   Procedure: CORONARY STENT INTERVENTION;  Surgeon: Nigel Mormon, MD;  Location: [place] [facility];  Service: Cardiovascular;  Laterality: N/A;  . ESOPHAGOGASTRODUODENOSCOPY (EGD) WITH PROPOFOL N/A 01/25/2018   Procedure: ESOPHAGOGASTRODUODENOSCOPY (EGD) WITH PROPOFOL;  Surgeon: Jerene Bears, MD;  Location: [place];  Service: Gastroenterology;  Laterality: N/A;  . ESOPHAGOGASTRODUODENOSCOPY (EGD) WITH PROPOFOL N/A 04/10/2019   Procedure: ESOPHAGOGASTRODUODENOSCOPY (EGD) WITH PROPOFOL;  Surgeon: Jerene Bears, MD;  [facility]: [facility];  Service: Gastroenterology;  Laterality: N/A;  . FLEXIBLE SIGMOIDOSCOPY N/A 07/15/2017   Procedure: FLEXIBLE SIGMOIDOSCOPY;  Surgeon: Carol Ada, MD;  Location: [place];  Service: Endoscopy;  Laterality: N/A;  . HEMORRHOID BANDING    . I & D EXTREMITY Left 06/01/2017   Procedure: IRRIGATION AND DEBRIDEMENT LEFT ARM HEMATOMA WITH LIGATION OF LEFT ARM AV FISTULA;  Surgeon: Elam Dutch, MD;  Location: [facility];  Service: Vascular;  Laterality: Left;  . I & D EXTREMITY Left 06/14/2017   Procedure: IRRIGATION AND DEBRIDEMENT EXTREMITY;  Surgeon: Katha Cabal, MD;  Location: [facility];  Service: Vascular;  Laterality: Left;  . INSERTION OF DIALYSIS CATHETER  05/30/2017  . INSERTION OF DIALYSIS CATHETER N/A 05/30/2017   Procedure: INSERTION OF DIALYSIS CATHETER;  Surgeon: Elam Dutch, MD;  Location: [place];  Service: Vascular;  Laterality: N/A;  . IR PARACENTESIS  08/30/2017  . IR PARACENTESIS  09/29/2017  . IR PARACENTESIS  10/28/2017  . IR PARACENTESIS  11/09/2017  . IR PARACENTESIS  11/16/2017  . IR PARACENTESIS  11/28/2017  . IR PARACENTESIS  12/01/2017  . IR PARACENTESIS  12/06/2017  . IR PARACENTESIS  01/03/2018  . IR PARACENTESIS  01/23/2018  . IR  PARACENTESIS  02/07/2018  . IR PARACENTESIS  02/21/2018  . IR PARACENTESIS  03/06/2018  . IR PARACENTESIS  03/17/2018  . IR PARACENTESIS  04/04/2018  . IR PARACENTESIS  12/28/2018  . IR PARACENTESIS  01/08/2019  . IR PARACENTESIS  01/23/2019  . IR PARACENTESIS  02/01/2019  . IR PARACENTESIS  02/19/2019  . IR PARACENTESIS  03/01/2019  . IR PARACENTESIS  03/15/2019  . IR PARACENTESIS  04/03/2019  . IR PARACENTESIS  04/12/2019  . IR PARACENTESIS  05/01/2019  . IR PARACENTESIS  05/08/2019  . IR PARACENTESIS  05/24/2019  . IR PARACENTESIS  06/12/2019  . IR PARACENTESIS  07/09/2019  . IR RADIOLOGIST EVAL & MGMT  02/14/2018  . IR RADIOLOGIST EVAL & MGMT  02/22/2019  . LEFT HEART CATH AND CORONARY ANGIOGRAPHY N/A 02/22/2017   Procedure: LEFT HEART CATH AND CORONARY ANGIOGRAPHY;  Surgeon: Nigel Mormon, MD;  Location: [facility] [facility];  Service: Cardiovascular;  Laterality: N/A;  . LIGATION OF ARTERIOVENOUS  FISTULA Left 11/16/3218   Procedure: Plication of Left Arm Arteriovenous Fistula;  Surgeon: Elam Dutch, MD;  Location: [place];  Service: Vascular;  Laterality: Left;  . POLYPECTOMY    . POLYPECTOMY  01/25/2018   Procedure: POLYPECTOMY;  Surgeon: Jerene Bears, MD;  Location: [place];  Service: Gastroenterology;;  . REVISON OF ARTERIOVENOUS FISTULA Left 7/82/9562   Procedure: PLICATION OF DISTAL ANEURYSMAL SEGEMENT OF LEFT UPPER ARM ARTERIOVENOUS FISTULA;  Surgeon: Elam Dutch, MD;  Location: [place];  Service: Vascular;  Laterality: Left;  . REVISON OF ARTERIOVENOUS FISTULA Left 08/11/8655   Procedure: Plication of Left Upper Arm Fistula ;  Surgeon: Waynetta Sandy, MD;  Location: [place];  Service: Vascular;  Laterality: Left;  . SKIN GRAFT SPLIT THICKNESS LEG / FOOT Left    SKIN GRAFT SPLIT THICKNESS LEFT ARM DONOR SITE: LEFT ANTERIOR THIGH  . SKIN SPLIT GRAFT Left 07/04/2017   Procedure: SKIN GRAFT SPLIT THICKNESS LEFT ARM DONOR SITE: LEFT ANTERIOR THIGH;  Surgeon: Elam Dutch, MD;  Location: [place];  Service: Vascular;  Laterality: Left;  . THROMBECTOMY W/ EMBOLECTOMY Left 06/05/2017   Procedure: EXPLORATION OF LEFT ARM FOR BLEEDING; OVERSEWED PROXIMAL FISTULA;  Surgeon: Angelia Mould, MD;  Location: [place];  Service: Vascular;  Laterality: Left;  . WOUND EXPLORATION Left 06/03/2017   Procedure: WOUND EXPLORATION WITH WOUND VAC APPLICATION TO LEFT ARM;  Surgeon: Angelia Mould, MD;  Location: [place];  Service: Vascular;  Laterality: Left;     A IV Location/Drains/Wounds Patient Lines/Drains/Airways Status   Active Line/Drains/Airways    Name:   Placement date:   Placement time:   Site:   Days:   Peripheral IV 07/15/19 Left Forearm   07/15/19    1755    Forearm   1   Fistula / Graft Right Upper arm Arteriovenous vein graft   11/22/17    0836    Upper arm   601   Wound / Incision (Open or Dehisced) 05/29/19 Other (Comment) Finger (Comment which one) Right skin torn at 1st knuckles from being mashed in door   05/29/19    2030    Finger (Comment which one)   48          Intake/Output Last 24 hours No intake or output data in the 24 hours ending 07/16/19 0021  Labs/Imaging Results for orders placed or performed during the hospital encounter of 07/15/19 (from the past 48 hour(s))  Lipase, blood     Status: None   Collection Time: 07/15/19 11:15 AM  Result Value Ref Range   Lipase 20 11 - 51 U/L    Comment: Performed at [facility], [place] [address], [place], [place] [address]  Comprehensive metabolic panel     Status: Abnormal   Collection Time: 07/15/19 11:15 AM  Result Value Ref Range   Sodium 133 (L) 135 - 145 mmol/L   Potassium 7.0 (HH) 3.5 - 5.1 mmol/L    Comment: NO VISIBLE HEMOLYSIS CRITICAL RESULT CALLED TO, READ BACK BY AND VERIFIED WITH: J.BLUE RN @ 2952 07/15/2019 BY C.EDENS    Chloride 94 (L) 98 - 111 mmol/L   CO2 22 22 - 32 mmol/L   Glucose, Bld 87 70 - 99 mg/dL   BUN 53 (H) 6 - 20 mg/dL   Creatinine, Ser 10.82  (H) 0.61 - 1.24 mg/dL   Calcium 9.9 8.9 - 10.3 mg/dL   Total Protein 6.4 (L) 6.5 - 8.1 g/dL   Albumin 3.7 3.5 - 5.0 g/dL   AST 25 15 -  41 U/L   ALT 11 0 - 44 U/L   Alkaline Phosphatase 79 38 - 126 U/L   Total Bilirubin 0.9 0.3 - 1.2 mg/dL   GFR calc non Af Amer 5 (L) >60 mL/min   GFR calc Af Amer 5 (L) >60 mL/min   Anion gap 17 (H) 5 - 15    Comment: Performed at [facility] [address] [place], [place] [address]  CBC     Status: Abnormal   Collection Time: 07/15/19 11:15 AM  Result Value Ref Range   WBC 8.1 4.0 - 10.5 K/uL   RBC 3.41 (L) 4.22 - 5.81 MIL/uL   Hemoglobin 9.2 (L) 13.0 - 17.0 g/dL   HCT 26.4 (L) 39.0 - 52.0 %   MCV 77.4 (L) 80.0 - 100.0 fL   MCH 27.0 26.0 - 34.0 pg   MCHC 34.8 30.0 - 36.0 g/dL   RDW 14.4 11.5 - 15.5 %   Platelets 155 150 - 400 K/uL   nRBC 0.0 0.0 - 0.2 %    Comment: Performed at [facility], [place] [address], [place], [place] [address]  Basic metabolic panel     Status: Abnormal   Collection Time: 07/15/19  8:19 PM  Result Value Ref Range   Sodium 130 (L) 135 - 145 mmol/L   Potassium 6.6 (HH) 3.5 - 5.1 mmol/L    Comment: CRITICAL RESULT CALLED TO, READ BACK BY AND VERIFIED WITH: CAIN A,RN 07/15/19 2053 WAYK    Chloride 92 (L) 98 - 111 mmol/L   CO2 23 22 - 32 mmol/L   Glucose, Bld 90 70 - 99 mg/dL   BUN 56 (H) 6 - 20 mg/dL   Creatinine, Ser 11.61 (H) 0.61 - 1.24 mg/dL   Calcium 10.2 8.9 - 10.3 mg/dL   GFR calc non Af Amer 4 (L) >60 mL/min   GFR calc Af Amer 5 (L) >60 mL/min   Anion gap 15 5 - 15    Comment: Performed at [facility], [place] [address] [place], [place] [address]  CBG monitoring, ED     Status: None   Collection Time: 07/15/19  9:14 PM  Result Value Ref Range   Glucose-Capillary 72 70 - 99 mg/dL   Comment 1 Document in Chart    DG Chest Port 1 View  Result Date: 07/15/2019 CLINICAL DATA:  Dialysis patient EXAM: PORTABLE CHEST 1 VIEW COMPARISON:  06/08/2019 FINDINGS: Cardiomegaly. Aortic  atherosclerosis and extensive vascular calcinosis. Minimal, diffuse interstitial opacity. Unchanged elevation of the left hemidiaphragm the visualized skeletal structures are unremarkable. IMPRESSION: Cardiomegaly with minimal, diffuse interstitial opacity, likely edema. No focal airspace opacity. Electronically Signed   By: Eddie Candle M.D.   On: 07/15/2019 17:07    Pending Labs Unresulted Labs (From admission, onward)    Start     Ordered   07/16/19 0102  Basic metabolic panel  Tomorrow morning,   R     07/15/19 2058   07/16/19 0500  CBC  Tomorrow morning,   R     07/15/19 2058   07/16/19 0000  Potassium  Now then every 4 hours,   R (with STAT occurrences)    Comments: Please discontinue order once potassium normal on 2 consecutive checks.    07/15/19 2011   07/15/19 2001  Na and K (sodium & potassium), rand urine  ONCE - STAT,   STAT     07/15/19 2001   07/15/19 1104  Urinalysis, Routine w reflex  microscopic  ONCE - STAT,   STAT     07/15/19 1104          Vitals/Pain Today's Vitals   07/15/19 2115 07/15/19 2130 07/15/19 2145 07/15/19 2255  BP: (!) 169/64 (!) 175/58 (!) 175/58   Pulse:    84  Resp: 12 15 14    Temp:      TempSrc:      SpO2:    95%  PainSc:        Isolation Precautions No active isolations  Medications Medications  Chlorhexidine Gluconate Cloth 2 % PADS 6 each (has no administration in time range)  hydrALAZINE (APRESOLINE) tablet 50 mg (50 mg Oral Not Given 07/15/19 2129)  metoprolol tartrate (LOPRESSOR) tablet 100 mg (100 mg Oral Not Given 07/15/19 2129)  lactulose (CHRONULAC) 10 GM/15ML solution 30 g (30 g Oral Refused 07/15/19 2130)  mometasone-formoterol (DULERA) 200-5 MCG/ACT inhaler 2 puff (has no administration in time range)  albuterol (VENTOLIN HFA) 108 (90 Base) MCG/ACT inhaler 2 puff (has no administration in time range)  heparin injection 5,000 Units (5,000 Units Subcutaneous Not Given 07/15/19 2130)  sodium chloride flush (NS) 0.9 % injection 3 mL  (3 mLs Intravenous Not Given 07/15/19 2334)  sodium chloride flush (NS) 0.9 % injection 3 mL (3 mLs Intravenous Given 07/15/19 2122)  sodium chloride flush (NS) 0.9 % injection 3 mL (has no administration in time range)  0.9 %  sodium chloride infusion (has no administration in time range)  acetaminophen (TYLENOL) tablet 650 mg (has no administration in time range)    Or  acetaminophen (TYLENOL) suppository 650 mg (has no administration in time range)  sodium chloride flush (NS) 0.9 % injection 3 mL (3 mLs Intravenous Given 07/15/19 1820)  calcium gluconate 1 g in sodium chloride 0.9 % 100 mL IVPB (0 g Intravenous Stopped 07/15/19 1925)  sodium polystyrene (KAYEXALATE) 15 GM/60ML suspension 45 g (45 g Oral Given 07/15/19 1828)  insulin aspart (novoLOG) injection 5 Units (5 Units Intravenous Given 07/15/19 2036)    And  dextrose 50 % solution 50 mL (50 mLs Intravenous Given 07/15/19 2035)  sodium bicarbonate injection 50 mEq (50 mEq Intravenous Given 07/15/19 2035)    Mobility walks with device Low fall risk   Focused Assessments Renal Assessment Handoff:  Hemodialysis Schedule: Hemodialysis Schedule: Monday/Wednesday/Friday Last Hemodialysis date and time:    Restricted appendage: right arm     R Recommendations: See Admitting Provider Note  Report given to:   Additional Notes:

## 2019-07-16 NOTE — Plan of Care (Signed)
Pt signed out AMA and left the hospital.  IV removed prior to patient leaving.

## 2019-07-17 ENCOUNTER — Other Ambulatory Visit: Payer: Self-pay

## 2019-07-17 ENCOUNTER — Observation Stay (HOSPITAL_COMMUNITY)
Admission: EM | Admit: 2019-07-17 | Discharge: 2019-07-18 | Disposition: A | Discharge status: Home / Self Care | Payer: Medicare Other | Attending: Internal Medicine | Admitting: Internal Medicine

## 2019-07-17 ENCOUNTER — Emergency Department (HOSPITAL_COMMUNITY): Payer: Medicare Other

## 2019-07-17 DIAGNOSIS — I447 Left bundle-branch block, unspecified: Secondary | ICD-10-CM | POA: Insufficient documentation

## 2019-07-17 DIAGNOSIS — I4891 Unspecified atrial fibrillation: Secondary | ICD-10-CM | POA: Diagnosis present

## 2019-07-17 DIAGNOSIS — E875 Hyperkalemia: Principal | ICD-10-CM | POA: Insufficient documentation

## 2019-07-17 DIAGNOSIS — K219 Gastro-esophageal reflux disease without esophagitis: Secondary | ICD-10-CM | POA: Diagnosis not present

## 2019-07-17 DIAGNOSIS — Z20822 Contact with and (suspected) exposure to covid-19: Secondary | ICD-10-CM | POA: Insufficient documentation

## 2019-07-17 DIAGNOSIS — I700 Atherosclerosis of aorta: Secondary | ICD-10-CM | POA: Diagnosis not present

## 2019-07-17 DIAGNOSIS — I132 Hypertensive heart and chronic kidney disease with heart failure and with stage 5 chronic kidney disease, or end stage renal disease: Secondary | ICD-10-CM | POA: Diagnosis not present

## 2019-07-17 DIAGNOSIS — Z79899 Other long term (current) drug therapy: Secondary | ICD-10-CM | POA: Diagnosis not present

## 2019-07-17 DIAGNOSIS — F1410 Cocaine abuse, uncomplicated: Secondary | ICD-10-CM | POA: Insufficient documentation

## 2019-07-17 DIAGNOSIS — Z9114 Patient's other noncompliance with medication regimen: Secondary | ICD-10-CM | POA: Insufficient documentation

## 2019-07-17 DIAGNOSIS — K7290 Hepatic failure, unspecified without coma: Secondary | ICD-10-CM | POA: Diagnosis present

## 2019-07-17 DIAGNOSIS — I480 Paroxysmal atrial fibrillation: Secondary | ICD-10-CM | POA: Diagnosis not present

## 2019-07-17 DIAGNOSIS — I2510 Atherosclerotic heart disease of native coronary artery without angina pectoris: Secondary | ICD-10-CM | POA: Diagnosis not present

## 2019-07-17 DIAGNOSIS — D631 Anemia in chronic kidney disease: Secondary | ICD-10-CM | POA: Insufficient documentation

## 2019-07-17 DIAGNOSIS — Z992 Dependence on renal dialysis: Secondary | ICD-10-CM | POA: Diagnosis not present

## 2019-07-17 DIAGNOSIS — Z85038 Personal history of other malignant neoplasm of large intestine: Secondary | ICD-10-CM | POA: Insufficient documentation

## 2019-07-17 DIAGNOSIS — K7469 Other cirrhosis of liver: Secondary | ICD-10-CM | POA: Insufficient documentation

## 2019-07-17 DIAGNOSIS — R000 Tachycardia, unspecified: Secondary | ICD-10-CM

## 2019-07-17 DIAGNOSIS — J449 Chronic obstructive pulmonary disease, unspecified: Secondary | ICD-10-CM | POA: Insufficient documentation

## 2019-07-17 DIAGNOSIS — N186 End stage renal disease: Secondary | ICD-10-CM

## 2019-07-17 DIAGNOSIS — R188 Other ascites: Secondary | ICD-10-CM | POA: Diagnosis not present

## 2019-07-17 DIAGNOSIS — B181 Chronic viral hepatitis B without delta-agent: Secondary | ICD-10-CM | POA: Diagnosis not present

## 2019-07-17 DIAGNOSIS — Z9115 Patient's noncompliance with renal dialysis: Secondary | ICD-10-CM | POA: Diagnosis not present

## 2019-07-17 DIAGNOSIS — M19012 Primary osteoarthritis, left shoulder: Secondary | ICD-10-CM | POA: Insufficient documentation

## 2019-07-17 DIAGNOSIS — I252 Old myocardial infarction: Secondary | ICD-10-CM | POA: Insufficient documentation

## 2019-07-17 DIAGNOSIS — D649 Anemia, unspecified: Secondary | ICD-10-CM | POA: Diagnosis present

## 2019-07-17 DIAGNOSIS — K7460 Unspecified cirrhosis of liver: Secondary | ICD-10-CM | POA: Diagnosis present

## 2019-07-17 DIAGNOSIS — Z955 Presence of coronary angioplasty implant and graft: Secondary | ICD-10-CM | POA: Diagnosis not present

## 2019-07-17 DIAGNOSIS — I10 Essential (primary) hypertension: Secondary | ICD-10-CM | POA: Diagnosis present

## 2019-07-17 DIAGNOSIS — F419 Anxiety disorder, unspecified: Secondary | ICD-10-CM | POA: Insufficient documentation

## 2019-07-17 DIAGNOSIS — Z66 Do not resuscitate: Secondary | ICD-10-CM | POA: Diagnosis present

## 2019-07-17 DIAGNOSIS — N2581 Secondary hyperparathyroidism of renal origin: Secondary | ICD-10-CM | POA: Insufficient documentation

## 2019-07-17 DIAGNOSIS — B182 Chronic viral hepatitis C: Secondary | ICD-10-CM | POA: Diagnosis not present

## 2019-07-17 DIAGNOSIS — Z7951 Long term (current) use of inhaled steroids: Secondary | ICD-10-CM | POA: Diagnosis not present

## 2019-07-17 DIAGNOSIS — F17210 Nicotine dependence, cigarettes, uncomplicated: Secondary | ICD-10-CM | POA: Insufficient documentation

## 2019-07-17 DIAGNOSIS — C7951 Secondary malignant neoplasm of bone: Secondary | ICD-10-CM | POA: Insufficient documentation

## 2019-07-17 DIAGNOSIS — I482 Chronic atrial fibrillation, unspecified: Secondary | ICD-10-CM | POA: Diagnosis present

## 2019-07-17 DIAGNOSIS — F329 Major depressive disorder, single episode, unspecified: Secondary | ICD-10-CM | POA: Insufficient documentation

## 2019-07-17 LAB — I-STAT CHEM 8, ED
BUN: 43 mg/dL — ABNORMAL HIGH (ref 6–20)
Calcium, Ion: 0.99 mmol/L — ABNORMAL LOW (ref 1.15–1.40)
Chloride: 99 mmol/L (ref 98–111)
Creatinine, Ser: 9.3 mg/dL — ABNORMAL HIGH (ref 0.61–1.24)
Glucose, Bld: 191 mg/dL — ABNORMAL HIGH (ref 70–99)
HCT: 33 % — ABNORMAL LOW (ref 39.0–52.0)
Hemoglobin: 11.2 g/dL — ABNORMAL LOW (ref 13.0–17.0)
Potassium: 6.6 mmol/L (ref 3.5–5.1)
Sodium: 131 mmol/L — ABNORMAL LOW (ref 135–145)
TCO2: 23 mmol/L (ref 22–32)

## 2019-07-17 LAB — COMPREHENSIVE METABOLIC PANEL
ALT: 13 U/L (ref 0–44)
AST: 43 U/L — ABNORMAL HIGH (ref 15–41)
Albumin: 3.5 g/dL (ref 3.5–5.0)
Alkaline Phosphatase: 76 U/L (ref 38–126)
Anion gap: 19 — ABNORMAL HIGH (ref 5–15)
BUN: 36 mg/dL — ABNORMAL HIGH (ref 6–20)
CO2: 20 mmol/L — ABNORMAL LOW (ref 22–32)
Calcium: 9.5 mg/dL (ref 8.9–10.3)
Chloride: 95 mmol/L — ABNORMAL LOW (ref 98–111)
Creatinine, Ser: 8.5 mg/dL — ABNORMAL HIGH (ref 0.61–1.24)
GFR calc Af Amer: 7 mL/min — ABNORMAL LOW (ref >60)
GFR calc non Af Amer: 6 mL/min — ABNORMAL LOW (ref >60)
Glucose, Bld: 200 mg/dL — ABNORMAL HIGH (ref 70–99)
Potassium: 6.9 mmol/L (ref 3.5–5.1)
Sodium: 134 mmol/L — ABNORMAL LOW (ref 135–145)
Total Bilirubin: 0.8 mg/dL (ref 0.3–1.2)
Total Protein: 6.7 g/dL (ref 6.5–8.1)

## 2019-07-17 LAB — CBC
HCT: 32.8 % — ABNORMAL LOW (ref 39.0–52.0)
Hemoglobin: 10.7 g/dL — ABNORMAL LOW (ref 13.0–17.0)
MCH: 27.1 pg (ref 26.0–34.0)
MCHC: 32.6 g/dL (ref 30.0–36.0)
MCV: 83 fL (ref 80.0–100.0)
Platelets: 186 10*3/uL (ref 150–400)
RBC: 3.95 MIL/uL — ABNORMAL LOW (ref 4.22–5.81)
RDW: 15.1 % (ref 11.5–15.5)
WBC: 9.6 10*3/uL (ref 4.0–10.5)
nRBC: 0 % (ref 0.0–0.2)

## 2019-07-17 LAB — PROTIME-INR
INR: 1.2 (ref 0.8–1.2)
Prothrombin Time: 15.5 seconds — ABNORMAL HIGH (ref 11.4–15.2)

## 2019-07-17 LAB — TROPONIN I (HIGH SENSITIVITY)
Troponin I (High Sensitivity): 54 ng/L — ABNORMAL HIGH (ref <18)
Troponin I (High Sensitivity): 311 ng/L (ref <18)

## 2019-07-17 LAB — CBG MONITORING, ED: Glucose-Capillary: 190 mg/dL — ABNORMAL HIGH (ref 70–99)

## 2019-07-17 LAB — APTT: aPTT: 30 seconds (ref 24–36)

## 2019-07-17 LAB — SARS CORONAVIRUS 2 (TAT 6-24 HRS): SARS Coronavirus 2: NEGATIVE

## 2019-07-17 MED ORDER — HYDRALAZINE HCL 50 MG PO TABS
50.0000 mg | ORAL_TABLET | Freq: Three times a day (TID) | ORAL | Status: DC
  Administered 2019-07-18: 50 mg via ORAL
  Filled 2019-07-17 (×4): qty 1

## 2019-07-17 MED ORDER — CHLORHEXIDINE GLUCONATE CLOTH 2 % EX PADS: 6.0000 | MEDICATED_PAD | Freq: Every day | CUTANEOUS | Status: DC

## 2019-07-17 MED ORDER — SODIUM CHLORIDE 0.9% FLUSH
3.0000 mL | Freq: Two times a day (BID) | INTRAVENOUS | Status: DC
  Administered 2019-07-17 – 2019-07-18 (×2): 3 mL via INTRAVENOUS

## 2019-07-17 MED ORDER — METOPROLOL TARTRATE 25 MG PO TABS
100.0000 mg | ORAL_TABLET | Freq: Two times a day (BID) | ORAL | Status: DC
  Administered 2019-07-17 – 2019-07-18 (×2): 100 mg via ORAL
  Filled 2019-07-17 (×3): qty 4

## 2019-07-17 MED ORDER — SODIUM BICARBONATE 8.4 % IV SOLN
50.0000 meq | Freq: Once | INTRAVENOUS | Status: AC
  Administered 2019-07-17: 50 meq via INTRAVENOUS
  Filled 2019-07-17: qty 50

## 2019-07-17 MED ORDER — FLUTICASONE FUROATE-VILANTEROL 200-25 MCG/INH IN AEPB
1.0000 | INHALATION_SPRAY | Freq: Every day | RESPIRATORY_TRACT | Status: DC
  Filled 2019-07-17: qty 28

## 2019-07-17 MED ORDER — PROCHLORPERAZINE EDISYLATE 10 MG/2ML IJ SOLN
10.0000 mg | Freq: Four times a day (QID) | INTRAMUSCULAR | Status: DC | PRN
  Administered 2019-07-17: 10 mg via INTRAVENOUS
  Filled 2019-07-17: qty 2

## 2019-07-17 MED ORDER — HEPARIN SODIUM (PORCINE) 5000 UNIT/ML IJ SOLN
5000.0000 [IU] | Freq: Three times a day (TID) | INTRAMUSCULAR | Status: DC
  Filled 2019-07-17 (×2): qty 1

## 2019-07-17 MED ORDER — CALCIUM GLUCONATE 10 % IV SOLN
INTRAVENOUS | Status: AC
  Filled 2019-07-17: qty 10

## 2019-07-17 MED ORDER — SODIUM CHLORIDE 0.9 % IV SOLN: INTRAVENOUS | Status: DC

## 2019-07-17 MED ORDER — INSULIN ASPART 100 UNIT/ML IV SOLN
5.0000 [IU] | Freq: Once | INTRAVENOUS | Status: AC
  Administered 2019-07-17: 5 [IU] via INTRAVENOUS

## 2019-07-17 MED ORDER — PANTOPRAZOLE SODIUM 40 MG PO TBEC
40.0000 mg | DELAYED_RELEASE_TABLET | Freq: Every day | ORAL | Status: DC
  Administered 2019-07-18: 40 mg via ORAL
  Filled 2019-07-17 (×2): qty 1

## 2019-07-17 MED ORDER — ETOMIDATE 2 MG/ML IV SOLN
8.0000 mg | Freq: Once | INTRAVENOUS | Status: DC
  Filled 2019-07-17: qty 10

## 2019-07-17 MED ORDER — LACTULOSE 10 GM/15ML PO SOLN
30.0000 g | Freq: Two times a day (BID) | ORAL | Status: DC
  Filled 2019-07-17: qty 60
  Filled 2019-07-17 (×2): qty 45

## 2019-07-17 MED ORDER — SODIUM CHLORIDE 0.9 % IV SOLN: 250.0000 mL | INTRAVENOUS | Status: DC | PRN

## 2019-07-17 MED ORDER — DEXTROSE 50 % IV SOLN
1.0000 | Freq: Once | INTRAVENOUS | Status: AC
  Administered 2019-07-17: 50 mL via INTRAVENOUS
  Filled 2019-07-17: qty 50

## 2019-07-17 MED ORDER — DILTIAZEM HCL 25 MG/5ML IV SOLN
10.0000 mg | Freq: Once | INTRAVENOUS | Status: AC
  Administered 2019-07-17: 10 mg via INTRAVENOUS
  Filled 2019-07-17: qty 5

## 2019-07-17 MED ORDER — SODIUM CHLORIDE 0.9% FLUSH: 3.0000 mL | INTRAVENOUS | Status: DC | PRN

## 2019-07-17 MED ORDER — CALCIUM GLUCONATE 10 % IV SOLN
1.0000 g | Freq: Once | INTRAVENOUS | Status: AC
  Administered 2019-07-17: 1 g via INTRAVENOUS
  Filled 2019-07-17: qty 10

## 2019-07-17 MED ORDER — SODIUM CHLORIDE 0.9 % IV SOLN
1.0000 g | Freq: Once | INTRAVENOUS | Status: AC
  Administered 2019-07-17: 1 g via INTRAVENOUS
  Filled 2019-07-17: qty 10

## 2019-07-17 MED ORDER — ZOLPIDEM TARTRATE 5 MG PO TABS: 10.0000 mg | ORAL_TABLET | Freq: Every evening | ORAL | Status: DC | PRN

## 2019-07-17 MED ORDER — SODIUM ZIRCONIUM CYCLOSILICATE 10 G PO PACK
10.0000 g | PACK | Freq: Once | ORAL | Status: AC
  Administered 2019-07-17: 10 g via ORAL
  Filled 2019-07-17: qty 1

## 2019-07-17 MED ORDER — SODIUM BICARBONATE 8.4 % IV SOLN
25.0000 meq | Freq: Once | INTRAVENOUS | Status: AC
  Administered 2019-07-17: 25 meq via INTRAVENOUS
  Filled 2019-07-17: qty 50

## 2019-07-17 MED ORDER — MONTELUKAST SODIUM 10 MG PO TABS
10.0000 mg | ORAL_TABLET | Freq: Every evening | ORAL | Status: DC
  Filled 2019-07-17 (×2): qty 1

## 2019-07-17 NOTE — ED Notes (Signed)
Pt requesting pain medication for back pain; requesting dilaudid; offered tylenol, pt reports he will try it but will need meds for itching

## 2019-07-17 NOTE — ED Notes (Signed)
Text page via amion with troponin results sent to Dr. Maylene Roes

## 2019-07-17 NOTE — ED Notes (Signed)
Pt requests to have BM, bedpan offered, refuses, explained to pt that walking or being OOB not safe at this time, continues to decline bedpan

## 2019-07-17 NOTE — ED Triage Notes (Signed)
Pt presents for tachycardia just prior arrival, arrives via POV driven by family. Pt had dialysis yesterday, missed Saturday, usual schedule MWF. Upon arrival to exam roo, HR 165bpm, zoll pads applied, IV access obtain and prescribed meds given (see MAR, Ca gluc, bicarb, dextrose, insulin). Pt cardiac monitor now displays afib at 75 bpm, reports decreased shortness of breath corresponding to this change. Updated EKG give to EDP.

## 2019-07-17 NOTE — H&P (Signed)
History and Physical    [name] TMA:[id number] DOB: 07-Sep-1960 DOA: 07/17/2019  PCP: [name], FNP  Patient coming from: Home  Chief Complaint: SOB   HPI: [name] is a 59 y.o. male with medical history significant of ESRD, medical noncompliance, hepatitis B and C with decompensated cirrhosis, ascites requiring regular paracentesis, COPD, hx PAF. He was admitted on 07/15/2019 due to SOB and abdominal distention, was admitted due to hyperkalemia, pulmonary edema, and required HD during hospitalization. He signed out [name] on 07/16/2019 and now returns due to shortness of breath.  He denies any fevers, cough, chest pain.  Does admit to some nausea and vomiting.  Does not make any urine.  ED Course: Labs significant for K 6.6, Cr 9.3, CXR shows persistent mild left basilar atelectasis. Nephrology consulted for HD.   Review of Systems: As per HPI. Otherwise, all other review of systems reviewed and are negative.   Past Medical History:  Diagnosis Date  . Anemia   . Anxiety   . Arthritis    left shoulder  . Atherosclerosis of aorta ([place])   . Cardiomegaly   . Chest pain    DATE UNKNOWN, C/O PERIODICALLY  . Cocaine abuse ([place])   . COPD exacerbation ([place]) 08/17/2016  . Coronary artery disease    stent 02/22/17  . ESRD (end stage renal disease) on dialysis ([place])    "[name]; MWF" (07/04/2017)  . GERD (gastroesophageal reflux disease)    DATE UNKNOWN  . Hemorrhoids   . Hepatitis B, chronic ([place])   . Hepatitis C   . History of kidney stones   . Hyperkalemia   . Hypertension   . Kidney failure   . Metabolic bone disease    Patient denies  . Mitral stenosis   . Myocardial infarction ([place])   . Pneumonia   . Pulmonary edema   . Solitary rectal ulcer syndrome 07/2017   at flex sig for rectal bleeding  . Tubular adenoma of colon     Past Surgical History:  Procedure Laterality Date  . A/V FISTULAGRAM Left 05/26/2017   Procedure: A/V FISTULAGRAM;   Surgeon: Conrad Montross, MD;  Location: [facility];  Service: Cardiovascular;  Laterality: Left;  . A/V FISTULAGRAM Right 11/18/2017   Procedure: A/V FISTULAGRAM - Right Arm;  Surgeon: Elam Dutch, MD;  Location: [facility];  Service: Cardiovascular;  Laterality: Right;  . APPLICATION OF WOUND VAC Left 06/14/2017   Procedure: APPLICATION OF WOUND VAC;  Surgeon: Katha Cabal, MD;  Location: [facility];  Service: Vascular;  Laterality: Left;  . AV FISTULA PLACEMENT  2012   BELIEVED WAS PLACED IN JUNE  . AV FISTULA PLACEMENT Right 08/09/2017   Procedure: Creation Right arm ARTERIOVENOUS BRACHIOCEPOHALIC FISTULA;  Surgeon: Elam Dutch, MD;  Location[facility];  Service: Vascular;  Laterality: Right;  . AV FISTULA PLACEMENT Right 11/22/2017   Procedure: INSERTION OF ARTERIOVENOUS (AV) GORE-TEX GRAFT RIGHT UPPER ARM;  Surgeon: Elam Dutch, MD;  Location: [place];  Service: Vascular;  Laterality: Right;  . BIOPSY  01/25/2018   Procedure: BIOPSY;  Surgeon: Jerene Bears, MD;  Location: [place];  Service: Gastroenterology;;  . BIOPSY  04/10/2019   Procedure: BIOPSY;  Surgeon: Jerene Bears, MD;  Location: [facility];  Service: Gastroenterology;;  . COLONOSCOPY    . COLONOSCOPY WITH PROPOFOL N/A 01/25/2018   Procedure: COLONOSCOPY WITH PROPOFOL;  Surgeon: Jerene Bears, MD;  Location: [facility]  ENDOSCOPY;  Service: Gastroenterology;  Laterality: N/A;  . CORONARY STENT INTERVENTION N/A 02/22/2017   Procedure: CORONARY STENT INTERVENTION;  Surgeon: Nigel Mormon, MD;  Location: [place] [facility];  Service: Cardiovascular;  Laterality: N/A;  . ESOPHAGOGASTRODUODENOSCOPY (EGD) WITH PROPOFOL N/A 01/25/2018   Procedure: ESOPHAGOGASTRODUODENOSCOPY (EGD) WITH PROPOFOL;  Surgeon: Jerene Bears, MD;  Location: [place];  Service: Gastroenterology;  Laterality: N/A;  . ESOPHAGOGASTRODUODENOSCOPY (EGD) WITH PROPOFOL N/A 04/10/2019   Procedure: ESOPHAGOGASTRODUODENOSCOPY (EGD) WITH  PROPOFOL;  Surgeon: Jerene Bears, MD;  Location: [facility];  Service: Gastroenterology;  Laterality: N/A;  . FLEXIBLE SIGMOIDOSCOPY N/A 07/15/2017   Procedure: FLEXIBLE SIGMOIDOSCOPY;  Surgeon: Carol Ada, MD;  Location: [place];  Service: Endoscopy;  Laterality: N/A;  . HEMORRHOID BANDING    . I & D EXTREMITY Left 06/01/2017   Procedure: IRRIGATION AND DEBRIDEMENT LEFT ARM HEMATOMA WITH LIGATION OF LEFT ARM AV FISTULA;  Surgeon: Elam Dutch, MD;  Location: [place];  Service: Vascular;  Laterality: Left;  . I & D EXTREMITY Left 06/14/2017   Procedure: IRRIGATION AND DEBRIDEMENT EXTREMITY;  Surgeon: Katha Cabal, MD;  Location: [facility];  Service: Vascular;  Laterality: Left;  . INSERTION OF DIALYSIS CATHETER  05/30/2017  . INSERTION OF DIALYSIS CATHETER N/A 05/30/2017   Procedure: INSERTION OF DIALYSIS CATHETER;  Surgeon: Elam Dutch, MD;  Location: [place];  Service: Vascular;  Laterality: N/A;  . IR PARACENTESIS  08/30/2017  . IR PARACENTESIS  09/29/2017  . IR PARACENTESIS  10/28/2017  . IR PARACENTESIS  11/09/2017  . IR PARACENTESIS  11/16/2017  . IR PARACENTESIS  11/28/2017  . IR PARACENTESIS  12/01/2017  . IR PARACENTESIS  12/06/2017  . IR PARACENTESIS  01/03/2018  . IR PARACENTESIS  01/23/2018  . IR PARACENTESIS  02/07/2018  . IR PARACENTESIS  02/21/2018  . IR PARACENTESIS  03/06/2018  . IR PARACENTESIS  03/17/2018  . IR PARACENTESIS  04/04/2018  . IR PARACENTESIS  12/28/2018  . IR PARACENTESIS  01/08/2019  . IR PARACENTESIS  01/23/2019  . IR PARACENTESIS  02/01/2019  . IR PARACENTESIS  02/19/2019  . IR PARACENTESIS  03/01/2019  . IR PARACENTESIS  03/15/2019  . IR PARACENTESIS  04/03/2019  . IR PARACENTESIS  04/12/2019  . IR PARACENTESIS  05/01/2019  . IR PARACENTESIS  05/08/2019  . IR PARACENTESIS  05/24/2019  . IR PARACENTESIS  06/12/2019  . IR PARACENTESIS  07/09/2019  . IR RADIOLOGIST EVAL & MGMT  02/14/2018  . IR RADIOLOGIST EVAL & MGMT  02/22/2019  . LEFT HEART CATH AND  CORONARY ANGIOGRAPHY N/A 02/22/2017   Procedure: LEFT HEART CATH AND CORONARY ANGIOGRAPHY;  Surgeon: Nigel [place], MD;  Location: [place] [facility];  Service: Cardiovascular;  Laterality: N/A;  . LIGATION OF ARTERIOVENOUS  FISTULA Left 3/0/1601   Procedure: Plication of Left Arm Arteriovenous Fistula;  Surgeon: Elam Dutch, MD;  Location: [place];  Service: Vascular;  Laterality: Left;  . POLYPECTOMY    . POLYPECTOMY  01/25/2018   Procedure: POLYPECTOMY;  Surgeon: Jerene Bears, MD;  Location: [place];  Service: Gastroenterology;;  . REVISON OF ARTERIOVENOUS FISTULA Left 0/93/2355   Procedure: PLICATION OF DISTAL ANEURYSMAL SEGEMENT OF LEFT UPPER ARM ARTERIOVENOUS FISTULA;  Surgeon: Elam Dutch, MD;  Location: [place];  Service: Vascular;  Laterality: Left;  . REVISON OF ARTERIOVENOUS FISTULA Left 7/32/2025   Procedure: Plication of Left Upper Arm Fistula ;  Surgeon: Waynetta [place], MD;  Location: [place];  Service: Vascular;  Laterality: Left;  . SKIN GRAFT SPLIT THICKNESS LEG / FOOT Left    SKIN GRAFT SPLIT THICKNESS LEFT ARM DONOR SITE: LEFT ANTERIOR THIGH  . SKIN SPLIT GRAFT Left 07/04/2017   Procedure: SKIN GRAFT SPLIT THICKNESS LEFT ARM DONOR SITE: LEFT ANTERIOR THIGH;  Surgeon: Elam Dutch, MD;  Location: [place];  Service: Vascular;  Laterality: Left;  . THROMBECTOMY W/ EMBOLECTOMY Left 06/05/2017   Procedure: EXPLORATION OF LEFT ARM FOR BLEEDING; OVERSEWED PROXIMAL FISTULA;  Surgeon: Angelia Mould, MD;  Location: [place];  Service: Vascular;  Laterality: Left;  . WOUND EXPLORATION Left 06/03/2017   Procedure: WOUND EXPLORATION WITH WOUND VAC APPLICATION TO LEFT ARM;  Surgeon: Angelia Mould, MD;  Location: [place];  Service: Vascular;  Laterality: Left;     reports that he has been smoking cigarettes. He started smoking about 45 years ago. He has a 21.50 pack-year smoking history. He has never used smokeless tobacco. He reports previous alcohol  use. He reports previous drug use. Drugs: Marijuana and Cocaine.  Allergies  Allergen Reactions  . Morphine And Related Other (See Comments)    Stomach pain  . Aspirin Other (See Comments)    STOMACH PAIN  . Clonidine Derivatives Itching  . Tramadol Itching  . Tylenol [Acetaminophen] Nausea Only    Stomach ache    Family History  Problem Relation Age of Onset  . Heart disease Mother   . Lung cancer Mother   . Heart disease Father   . Malignant hyperthermia Father   . COPD Father   . Throat cancer Sister   . Esophageal cancer Sister   . Hypertension Other   . COPD Other   . Colon cancer Neg Hx   . Colon polyps Neg Hx   . Rectal cancer Neg Hx   . Stomach cancer Neg Hx      Prior to Admission medications   Medication Sig Start Date End Date Taking? Authorizing Provider  albuterol (VENTOLIN HFA) 108 (90 Base) MCG/ACT inhaler Inhale 2 puffs into the lungs every 6 (six) hours as needed for wheezing or shortness of breath.    [provider]  gabapentin (NEURONTIN) 100 MG capsule Take 100 mg by mouth 2 (two) times daily. 07/03/19   [provider]  hydrALAZINE (APRESOLINE) 100 MG tablet Take 0.5 tablets (50 mg total) by mouth 3 (three) times daily. 05/31/19   [place], Carley D, DO  lactulose (CHRONULAC) 10 GM/15ML solution TAKE 45 MLS BY MOUTH 2 TIMES DAILY. Patient taking differently: Take 30 g by mouth 2 (two) times daily.  06/04/19   Al Decant, MD  loperamide (IMODIUM A-D) 2 MG tablet Take 2 mg by mouth 4 (four) times daily as needed for diarrhea or loose stools.    [provider]  metoprolol tartrate (LOPRESSOR) 100 MG tablet Take 1 tablet (100 mg total) by mouth 2 (two) times daily. 06/04/19   Al Decant, MD  montelukast (SINGULAIR) 10 MG tablet Take 10 mg by mouth every evening.     [provider]  ondansetron (ZOFRAN) 8 MG tablet Take 8 mg by mouth every 4 (four) hours as needed. 06/14/19   [provider]    pantoprazole (PROTONIX) 40 MG tablet Take 1 tablet (40 mg total) by mouth daily. Patient taking differently: Take 40 mg by mouth See admin instructions. Take 40 mg by mouth in the morning before breakfast and an additional 40 mg once a day, if symptoms persist 06/05/19  Maudie Mercury, MD  sevelamer carbonate (RENVELA) 800 MG tablet Take 4 tablets with meals  And 2 tablets twice daily with snacks Patient taking differently: Take 1,600-2,400 mg by mouth See admin instructions. Take 2,400 mg by mouth three times a day with meals and 1,600 mg with each snack 01/22/18   Patrecia Pour, MD  sulfamethoxazole-trimethoprim (BACTRIM DS) 800-160 MG tablet Take 1 tablet by mouth daily. Patient taking differently: Take 1 tablet by mouth 2 (two) times daily.  12/30/18   Geradine Girt, DO  SYMBICORT 160-4.5 MCG/ACT inhaler Inhale 2 puffs into the lungs 2 (two) times daily.  11/30/18   [provider]  zolpidem (AMBIEN) 10 MG tablet Take 10 mg by mouth at bedtime as needed for sleep.    [provider]    Physical Exam: Vitals:   07/17/19 1530 07/17/19 1545 07/17/19 1600 07/17/19 1615  BP: (!) 160/97 (!) 169/98 (!) 188/107 (!) 155/112  Pulse: 94 84 (!) 57 (!) 120  Resp: 14 16 20 18   Temp:      TempSrc:      SpO2: 100% 97% 98% 98%     Constitutional: NAD, calm, comfortable Eyes: PERRL, lids and conjunctivae normal ENMT: Mucous membranes are moist. Respiratory: Clear to auscultation bilaterally, no wheezing, no crackles. Normal respiratory effort. No accessory muscle use. No conversational dyspnea.  On room air Cardiovascular: Irregular rhythm, rate ranging from 70-130 during examination, no murmurs. No extremity edema.  Abdomen: Soft, nondistended, nontender to palpation. Bowel sounds positive.  Musculoskeletal: No joint deformity upper and lower extremities. No contractures. Normal muscle tone.  Skin: no rashes, lesions, ulcers on exposed skin  Neurologic: Alert and oriented,  speech fluent, no focal deficits.   Psychiatric: Stable.  Normal mood and affect   Labs on Admission: I have personally reviewed following labs and imaging studies  CBC: Recent Labs  Lab 07/14/19 1555 07/15/19 1115 07/16/19 0454 07/17/19 1400 07/17/19 1425  WBC 9.3 8.1 8.2 9.6  --   HGB 9.8* 9.2* 9.6* 10.7* 11.2*  HCT 28.0* 26.4* 27.5* 32.8* 33.0*  MCV 78.4* 77.4* 77.7* 83.0  --   PLT 175 155 167 186  --    Basic Metabolic Panel: Recent Labs  Lab 07/14/19 1555 07/15/19 1115 07/15/19 2019 07/16/19 0454 07/16/19 1250 07/16/19 1624 07/17/19 1400 07/17/19 1425  NA 134* 133* 130* 135  --   --  134* 131*  K 6.3* 7.0* 6.6* 6.3* 4.2 4.7 6.9* 6.6*  CL 93* 94* 92* 94*  --   --  95* 99  CO2 24 22 23 24   --   --  20*  --   GLUCOSE 141* 87 90 87  --   --  200* 191*  BUN 44* 53* 56* 56*  --   --  36* 43*  CREATININE 9.63* 10.82* 11.61* 11.49*  --   --  8.50* 9.30*  CALCIUM 9.9 9.9 10.2 9.9  --   --  9.5  --    GFR: Estimated Creatinine Clearance: 8.2 mL/min (A) (by C-G formula based on SCr of 9.3 mg/dL (H)). Liver Function Tests: Recent Labs  Lab 07/14/19 1555 07/15/19 1115 07/17/19 1400  AST 26 25 43*  ALT 14 11 13   ALKPHOS 94 79 76  BILITOT 0.6 0.9 0.8  PROT 6.7 6.4* 6.7  ALBUMIN 3.5 3.7 3.5   Recent Labs  Lab 07/14/19 1555 07/15/19 1115  LIPASE 25 20   No results for input(s): AMMONIA in the last 168 hours. Coagulation  Profile: Recent Labs  Lab 07/17/19 1422  INR 1.2   Cardiac Enzymes: No results for input(s): CKTOTAL, CKMB, CKMBINDEX, TROPONINI in the last 168 hours. BNP (last 3 results) No results for input(s): PROBNP in the last 8760 hours. HbA1C: No results for input(s): HGBA1C in the last 72 hours. CBG: Recent Labs  Lab 07/14/19 2008 07/14/19 2049 07/15/19 2114 07/17/19 1351  GLUCAP 33* 63* 72 190*   Lipid Profile: No results for input(s): CHOL, HDL, LDLCALC, TRIG, CHOLHDL, LDLDIRECT in the last 72 hours. Thyroid Function Tests: No  results for input(s): TSH, T4TOTAL, FREET4, T3FREE, THYROIDAB in the last 72 hours. Anemia Panel: No results for input(s): VITAMINB12, FOLATE, FERRITIN, TIBC, IRON, RETICCTPCT in the last 72 hours. Urine analysis:    Component Value Date/Time   COLORURINE YELLOW 07/16/2011 1619   APPEARANCEUR CLEAR 07/16/2011 1619   LABSPEC 1.018 07/16/2011 1619   PHURINE 7.5 07/16/2011 1619   GLUCOSEU 250 (A) 07/16/2011 1619   HGBUR MODERATE (A) 07/16/2011 1619   BILIRUBINUR SMALL (A) 07/16/2011 1619   KETONESUR NEGATIVE 07/16/2011 1619   PROTEINUR >300 (A) 07/16/2011 1619   UROBILINOGEN 1.0 07/16/2011 1619   NITRITE NEGATIVE 07/16/2011 1619   LEUKOCYTESUR SMALL (A) 07/16/2011 1619   Sepsis Labs: !!!!!!!!!!!!!!!!!!!!!!!!!!!!!!!!!!!!!!!!!!!! @LABRCNTIP (procalcitonin:4,lacticidven:4) ) Recent Results (from the past 240 hour(s))  Respiratory Panel by RT PCR (Flu A&B, Covid) - Nasopharyngeal Swab     Status: None   Collection Time: 07/14/19  8:04 PM   Specimen: Nasopharyngeal Swab  Result Value Ref Range Status   SARS Coronavirus 2 by RT PCR NEGATIVE NEGATIVE Final    Comment: (NOTE) SARS-CoV-2 target nucleic acids are NOT DETECTED. The SARS-CoV-2 RNA is generally detectable in upper respiratoy specimens during the acute phase of infection. The lowest concentration of SARS-CoV-2 viral copies this assay can detect is 131 copies/mL. A negative result does not preclude SARS-Cov-2 infection and should not be used as the sole basis for treatment or other patient management decisions. A negative result may occur with  improper specimen collection/handling, submission of specimen other than nasopharyngeal swab, presence of viral mutation(s) within the areas targeted by this assay, and inadequate number of viral copies (<131 copies/mL). A negative result must be combined with clinical observations, patient history, and epidemiological information. The expected result is Negative. Fact Sheet for  Patients:  PinkCheek.be Fact Sheet for Healthcare Providers:  GravelBags.it This test is not yet ap proved or cleared by the [place] FDA and  has been authorized for detection and/or diagnosis of SARS-CoV-2 by FDA under an Emergency Use Authorization (EUA). This EUA will remain  in effect (meaning this test can be used) for the duration of the COVID-19 declaration under Section 564(b)(1) of the Act, 21 U.S.C. section 360bbb-3(b)(1), unless the authorization is terminated or revoked sooner.    Influenza A by PCR NEGATIVE NEGATIVE Final   Influenza B by PCR NEGATIVE NEGATIVE Final    Comment: (NOTE) The Xpert Xpress SARS-CoV-2/FLU/RSV assay is intended as an aid in  the diagnosis of influenza from Nasopharyngeal swab specimens and  should not be used as a sole basis for treatment. Nasal washings and  aspirates are unacceptable for Xpert Xpress SARS-CoV-2/FLU/RSV  testing. Fact Sheet for Patients: PinkCheek.be Fact Sheet for Healthcare Providers: GravelBags.it This test is not yet approved or cleared by the [place] FDA and  has been authorized for detection and/or diagnosis of SARS-CoV-2 by  FDA under an Emergency Use Authorization (EUA). This EUA will remain  in effect (meaning this  test can be used) for the duration of the  Covid-19 declaration under Section 564(b)(1) of the Act, 21  U.S.C. section 360bbb-3(b)(1), unless the authorization is  terminated or revoked. Performed at [facility], [place] [address] [place], [place] [address]      Radiological Exams on Admission: XR Chest Portable  Result Date: 07/17/2019 CLINICAL DATA:  Tachycardia, shortness of breath, hyperkalemia, dialysis patient, hypertension, COPD, colorectal cancer, bony metastases EXAM: PORTABLE CHEST 1 VIEW COMPARISON:  Portable exam 1425 hours compared to 07/15/2019 FINDINGS:  External pacing leads project over chest. Enlargement of cardiac silhouette. Mediastinal contours and pulmonary vascularity normal. Atherosclerotic calcification aorta. Elevation of LEFT diaphragm with LEFT basilar atelectasis. Remaining lungs clear. Skin fold projects over lateral upper RIGHT hemithorax. No acute infiltrate, pleural effusion or pneumothorax. IMPRESSION: Persistent mild LEFT basilar atelectasis. Enlargement of cardiac silhouette. Electronically Signed   By: Lavonia Dana M.D.   On: 07/17/2019 14:36    EKG: Independently reviewed. A Fib with LBBB (present in previous EKG)  Assessment/Plan Principal Problem:   Hyperkalemia Active Problems:   Chronic hepatitis B (HCC)   Chronic hepatitis C without hepatic coma (HCC)   Essential hypertension   Anemia   ESRD on dialysis ([place])   DNR (do not resuscitate)   Liver cirrhosis ([place])   A-fib ([place])    Hyperkalemia with noncompliance with HD -Given calcium gluconate, insulin/dextrose, bicarb, lokelma in the ED  -Nephrology consulted for HD  Chronic diastolic CHF -Volume management via HD   Chronic hepatitis B and C with cirrhosis and ascites -Last paracentesis 12/28 -PPI, lactulose, bactrim for SBP prophylaxis (on hold due to hyperkalemia)   Paroxysmal A Fib with RVR -Metoprolol  HTN -Hydralazine, metoprolol   Chronic anemia of chronic kidney disease -Stable    DVT prophylaxis: Subq hep Code Status: DNR   Family Communication: None at bedside  Disposition Plan: Pending improvement after HD  Consults called: Nephrology  Admission status: Inpatient    * I certify that at the point of admission it is my clinical judgment that the patient will require inpatient hospital care spanning beyond 2 midnights from the point of admission due to high intensity of service, high risk for further deterioration and high frequency of surveillance required.Dessa Phi, DO [facility] 07/17/2019, 5:15 PM   Available via  Epic secure chat 7am-7pm After these hours, please refer to coverage provider listed on [email or web address]

## 2019-07-17 NOTE — ED Notes (Addendum)
Pt noted in wide complex rate 170bpm, EDP consulted while paging n call MD (Dr.Choi). EDP gives orders for diltiazem and bicarb, received return call from Dr. Maylene Roes, agrees with this plan, would also like lopressor to be given now. Rate now92 bpm, irregular.

## 2019-07-17 NOTE — ED Notes (Signed)
Charge RN Roselyn Reef aware of patient's HR of 160, Charge RN working to find a treatment room for patient.

## 2019-07-17 NOTE — ED Notes (Signed)
Pt HR 160. Charge RN aware.

## 2019-07-17 NOTE — Progress Notes (Addendum)
[facility] Progress Note    Background: Patient presented to ED with abdominal pain/hyperkalemia 07/14/2019. He refused to stay for hemodialysis and left AMA. He returned to ED 07/15/2019 with hyperkalemia and was admitted for hyperkalemia/pulmonary edema. He received HD 07/17/2019 then left hospital [facility] 07/17/2019. He returned to ED today with SOB, HR 160s, K+6.6. HR now back down to 90s, SR with LBBB on EKG (Old).  CXR with mild persistent left basilar atelectasis, otherwise clear. Initially he refused to have HD three times, now after discussion with myself and ED physician, he agrees to stay to receive hemodialysis.   Subjective: Asking for water but no specific complaints. Denies SOB at present.   Objective Vitals:   07/17/19 1316 07/17/19 1356 07/17/19 1400 07/17/19 1530  BP: 111/84 103/78  (!) 160/97  Pulse: (!) 160 (!) 158  94  Resp: (!) 22 18  14   Temp: 97.7 F (36.5 C) 97.6 F (36.4 C)    TempSrc:  Oral    SpO2: 100% 100% 100% 100%   Physical Exam General: Chronically ill appearing male in NAD Heart: S1,S2 RRR. SR/ST on monitor.  Lungs: CTAB slightly decreased in bases. RR 14-24, no WOB Abdomen: protuberant, probable ascites present. Active BS Extremities: No LE edema Dialysis Access: R AVG + bruit   Additional Objective Labs: Basic Metabolic Panel: Recent Labs  Lab 07/15/19 2019 07/16/19 0454 07/16/19 1624 07/17/19 1400 07/17/19 1425  NA 130* 135  --  134* 131*  K 6.6* 6.3* 4.7 6.9* 6.6*  CL 92* 94*  --  95* 99  CO2 23 24  --  20*  --   GLUCOSE 90 87  --  200* 191*  BUN 56* 56*  --  36* 43*  CREATININE 11.61* 11.49*  --  8.50* 9.30*  CALCIUM 10.2 9.9  --  9.5  --    Liver Function Tests: Recent Labs  Lab 07/14/19 1555 07/15/19 1115 07/17/19 1400  AST 26 25 43*  ALT 14 11 13   ALKPHOS 94 79 76  BILITOT 0.6 0.9 0.8  PROT 6.7 6.4* 6.7  ALBUMIN 3.5 3.7 3.5   Recent Labs  Lab 07/14/19 1555 07/15/19 1115  LIPASE 25 20    CBC: Recent Labs  Lab 07/14/19 1555 07/15/19 1115 07/16/19 0454 07/17/19 1400 07/17/19 1425  WBC 9.3 8.1 8.2 9.6  --   HGB 9.8* 9.2* 9.6* 10.7* 11.2*  HCT 28.0* 26.4* 27.5* 32.8* 33.0*  MCV 78.4* 77.4* 77.7* 83.0  --   PLT 175 155 167 186  --    Blood Culture    Component Value Date/Time   SDES ABDOMEN FLUID 05/14/2019 0659   SPECREQUEST NONE 05/14/2019 0659   CULT  05/14/2019 0659    NO GROWTH 3 DAYS Performed at [facility], [place] [address] [place], [place] [address]    REPTSTATUS 05/17/2019 FINAL 05/14/2019 0659    Cardiac Enzymes: No results for input(s): CKTOTAL, CKMB, CKMBINDEX, TROPONINI in the last 168 hours. CBG: Recent Labs  Lab 07/14/19 2008 07/14/19 2049 07/15/19 2114 07/17/19 1351  GLUCAP 33* 63* 72 190*   Iron Studies: No results for input(s): IRON, TIBC, TRANSFERRIN, FERRITIN in the last 72 hours. @lablastinr3 @ Studies/Results: XR Chest Portable  Result Date: 07/17/2019 CLINICAL DATA:  Tachycardia, shortness of breath, hyperkalemia, dialysis patient, hypertension, COPD, colorectal cancer, bony metastases EXAM: PORTABLE CHEST 1 VIEW COMPARISON:  Portable exam 1425 hours compared to 07/15/2019 FINDINGS: External pacing leads project over chest. Enlargement of cardiac silhouette. Mediastinal contours and  pulmonary vascularity normal. Atherosclerotic calcification aorta. Elevation of LEFT diaphragm with LEFT basilar atelectasis. Remaining lungs clear. Skin fold projects over lateral upper RIGHT hemithorax. No acute infiltrate, pleural effusion or pneumothorax. IMPRESSION: Persistent mild LEFT basilar atelectasis. Enlargement of cardiac silhouette. Electronically Signed   By: Lavonia Dana M.D.   On: 07/17/2019 14:36   DG Chest Port 1 View  Result Date: 07/15/2019 CLINICAL DATA:  Dialysis patient EXAM: PORTABLE CHEST 1 VIEW COMPARISON:  06/08/2019 FINDINGS: Cardiomegaly. Aortic atherosclerosis and extensive vascular calcinosis. Minimal, diffuse  interstitial opacity. Unchanged elevation of the left hemidiaphragm the visualized skeletal structures are unremarkable. IMPRESSION: Cardiomegaly with minimal, diffuse interstitial opacity, likely edema. No focal airspace opacity. Electronically Signed   By: Eddie Candle M.D.   On: 07/15/2019 17:07   Medications: . sodium chloride 20 mL/hr at 07/17/19 1422   . calcium gluconate      . calcium gluconate      . [START ON 07/18/2019] Chlorhexidine Gluconate Cloth  6 each Topical Q0600  . etomidate  8 mg Intravenous Once  . sodium zirconium cyclosilicate  10 g Oral Once   HD orders: [facility] MWF 4 hrs 180NRe 450/800 65 kg 2.0 K/ 2.0 Ca AVG -No heparin -Parsabiv 2.5 mg IV TIW -Mircera 100 mcg IV q 2 weeks (last dose 06/29/2019    Assessment/Plan: 1. Hyperkalemia-K+6.6. Urgent HD today if patient actually agrees to HD.  2. ESRD -MWF HD off schedule today and again tomorrow to get back on schedule.  3. Anemia - HGB 11.2. No recent ESA, not needed now.  4. Secondary hyperparathyroidism - Ca 9.5 continue orders, Parsabiv not on hospital formulary.  5. HTN/volume -Does not seem to be volume overloaded by exam or CXR. Got to ED last HD 07/16/19. BP better controlled this admission, continue home meds.  6. Nutrition -NPO at present 7. Chronic hepatitis C 8. Cirrhosis with ascites  Rita H. Brown NP-C 07/17/2019, 4:15 PM  [facility] [phone]  Nephrology attending: Seen and examined.  Chart reviewed.  I agree with assessment and plan as outlined above. He is in the ED, lying on bed comfortable.  Initially presented with wide-complex tachycardia associated with hyperkalemia.  Patient is noncompliant with dialysis treatment.  He finally agreed to stay and have dialysis tonight.  He received medical treatment.  Plan for urgent HD today.  He has right AV graft for the access.  Lawson Radar, MD [facility].

## 2019-07-17 NOTE — ED Provider Notes (Signed)
Pt seen by Dr Kathrynn Humble initially.  Please see his note.  Pt has been admitted for tachycardia and hyperkalemia.  Back in wide complex irregular tachycardic rhythm.  Appears to be a fib abberancy.  Will tx with cardizem, bicarb and cacium gluconate.   Dorie Rank, MD 07/17/19 [phone]

## 2019-07-17 NOTE — ED Notes (Signed)
Pt requesting pain meds for back pain. Paged admitting MD. Pt has several allergies to pain meds, so verbal orders to give Tylenol if pt agrees to admin for pain, due to it causing nausea per chart

## 2019-07-18 ENCOUNTER — Telehealth: Payer: Self-pay | Admitting: Nephrology

## 2019-07-18 DIAGNOSIS — E875 Hyperkalemia: Secondary | ICD-10-CM | POA: Diagnosis not present

## 2019-07-18 LAB — BASIC METABOLIC PANEL
Anion gap: 14 (ref 5–15)
BUN: 40 mg/dL — ABNORMAL HIGH (ref 6–20)
CO2: 28 mmol/L (ref 22–32)
Calcium: 9.5 mg/dL (ref 8.9–10.3)
Chloride: 94 mmol/L — ABNORMAL LOW (ref 98–111)
Creatinine, Ser: 9.08 mg/dL — ABNORMAL HIGH (ref 0.61–1.24)
GFR calc Af Amer: 7 mL/min — ABNORMAL LOW (ref >60)
GFR calc non Af Amer: 6 mL/min — ABNORMAL LOW (ref >60)
Glucose, Bld: 98 mg/dL (ref 70–99)
Potassium: 5 mmol/L (ref 3.5–5.1)
Sodium: 136 mmol/L (ref 135–145)

## 2019-07-18 LAB — CBC
HCT: 27.1 % — ABNORMAL LOW (ref 39.0–52.0)
Hemoglobin: 9.6 g/dL — ABNORMAL LOW (ref 13.0–17.0)
MCH: 27.4 pg (ref 26.0–34.0)
MCHC: 35.4 g/dL (ref 30.0–36.0)
MCV: 77.2 fL — ABNORMAL LOW (ref 80.0–100.0)
Platelets: 151 10*3/uL (ref 150–400)
RBC: 3.51 MIL/uL — ABNORMAL LOW (ref 4.22–5.81)
RDW: 14.4 % (ref 11.5–15.5)
WBC: 7.7 10*3/uL (ref 4.0–10.5)
nRBC: 0 % (ref 0.0–0.2)

## 2019-07-18 MED ORDER — PENTAFLUOROPROP-TETRAFLUOROETH EX AERO
1.0000 "application " | INHALATION_SPRAY | CUTANEOUS | Status: DC | PRN
  Filled 2019-07-18: qty 116

## 2019-07-18 MED ORDER — LIDOCAINE-PRILOCAINE 2.5-2.5 % EX CREA
1.0000 "application " | TOPICAL_CREAM | CUTANEOUS | Status: DC | PRN
  Filled 2019-07-18: qty 5

## 2019-07-18 MED ORDER — SODIUM CHLORIDE 0.9 % IV SOLN: 100.0000 mL | INTRAVENOUS | Status: DC | PRN

## 2019-07-18 MED ORDER — LIDOCAINE HCL (PF) 1 % IJ SOLN: 5.0000 mL | INTRAMUSCULAR | Status: DC | PRN

## 2019-07-18 MED ORDER — SODIUM ZIRCONIUM CYCLOSILICATE 10 G PO PACK
10.0000 g | PACK | Freq: Once | ORAL | Status: AC
  Administered 2019-07-18: 10 g via ORAL
  Filled 2019-07-18: qty 1

## 2019-07-18 NOTE — ED Notes (Signed)
Pt taken to dialysis 

## 2019-07-18 NOTE — Discharge Summary (Signed)
Physician Discharge Summary  [name] WGY:[id number] DOB: 20-May-1961 DOA: 07/17/2019  PCP: [name], FNP  Admit date: 07/17/2019 Discharge date: 07/18/2019  Admitted From: home Disposition:  home  Recommendations for Outpatient Follow-up:  1. Follow up with PCP in 1 week 2. Follow up with HD MWF as scheduled. Refused HD today.   Discharge Condition: Stable CODE STATUS: DNR  Diet recommendation: Renal diet   Brief/Interim Summary: Joshua Lazarz Hoskinsis a 59 y.o.malewith medical history significant ofESRD, medical noncompliance, hepatitis B and C with decompensated cirrhosis, ascites requiring regular paracentesis, COPD, hx PAF. He was admitted on 07/15/2019 due to SOB and abdominal distention, was admitted due to hyperkalemia, pulmonary edema, and required HD during hospitalization. He signed out [facility] on 07/16/2019 and now returns due toshortness of breath. He denies any fevers, cough, chest pain. Does admit to some nausea and vomiting. Does not make any urine. In the ED, labs significant for K 6.6, Cr 9.3, CXR shows persistent mild left basilar atelectasis. Nephrology consulted for HD.  He was also in A. fib RVR, given 1 dose IV Cardizem and resumed on home Lopressor with improvement in his heart rate. He underwent dialysis during hospitalization (early 1/6 AM).   Discharge Diagnoses:  Principal Problem:   Hyperkalemia Active Problems:   Chronic hepatitis B (HCC)   Chronic hepatitis C without hepatic coma (HCC)   Essential hypertension   Anemia   ESRD on dialysis ([place])   DNR (do not resuscitate)   Liver cirrhosis ([place])   A-fib ([place])    Hyperkalemia with noncompliance with HD -Given calcium gluconate, insulin/dextrose, bicarb, lokelma in the ED. Underwent urgent dialysis overnight.  Hyperkalemia resolved. -Nephrology following for HD  Chronic diastolic CHF -Volume management via HD   Chronic hepatitis B and C with cirrhosis and ascites -Last  paracentesis 12/28 -PPI, lactulose, bactrim for SBP prophylaxis  Paroxysmal A Fib with RVR -Continue metoprolol -Heart rate improved in the 70s today  HTN -Continue hydralazine, metoprolol   Chronic anemia of chronic kidney disease -Stable  Elevated troponin -Likely due to demand ischemia in setting of ESRD and A. fib RVR, denies any chest pain this morning   Discharge Instructions  Discharge Instructions    Call MD for:  difficulty breathing, headache or visual disturbances   Complete by: As directed    Call MD for:  extreme fatigue   Complete by: As directed    Call MD for:  persistant dizziness or light-headedness   Complete by: As directed    Call MD for:  persistant nausea and vomiting   Complete by: As directed    Call MD for:  severe uncontrolled pain   Complete by: As directed    Call MD for:  temperature >100.4   Complete by: As directed    Discharge instructions   Complete by: As directed    You were cared for by a hospitalist during your hospital stay. If you have any questions about your discharge medications or the care you received while you were in the hospital after you are discharged, you can call the unit and ask to speak with the hospitalist on call if the hospitalist that took care of you is not available. Once you are discharged, your primary care physician will handle any further medical issues. Please note that NO REFILLS for any discharge medications will be authorized once you are discharged, as it is imperative that you return to your primary care physician (or establish a relationship with a  primary care physician if you do not have one) for your aftercare needs so that they can reassess your need for medications and monitor your lab values.   Increase activity slowly   Complete by: As directed      Allergies as of 07/18/2019      Reactions   Morphine And Related Other (See Comments)   Stomach pain   Aspirin Other (See Comments)   STOMACH PAIN    Clonidine Derivatives Itching   Tramadol Itching   Tylenol [acetaminophen] Nausea Only   Stomach ache      Medication List    TAKE these medications   hydrALAZINE 100 MG tablet Commonly known as: APRESOLINE Take 0.5 tablets (50 mg total) by mouth 3 (three) times daily.   lactulose 10 GM/15ML solution Commonly known as: CHRONULAC TAKE 45 MLS BY MOUTH 2 TIMES DAILY. What changed:   how much to take  how to take this  when to take this  additional instructions   loperamide 2 MG tablet Commonly known as: IMODIUM A-D Take 2 mg by mouth 4 (four) times daily as needed for diarrhea or loose stools.   metoprolol tartrate 100 MG tablet Commonly known as: LOPRESSOR Take 1 tablet (100 mg total) by mouth 2 (two) times daily.   montelukast 10 MG tablet Commonly known as: SINGULAIR Take 10 mg by mouth every evening.   ondansetron 8 MG tablet Commonly known as: ZOFRAN Take 8 mg by mouth every 4 (four) hours as needed.   pantoprazole 40 MG tablet Commonly known as: PROTONIX Take 1 tablet (40 mg total) by mouth daily. What changed:   when to take this  additional instructions   sevelamer carbonate 800 MG tablet Commonly known as: RENVELA Take 4 tablets with meals  And 2 tablets twice daily with snacks What changed:   how much to take  how to take this  when to take this  additional instructions   sulfamethoxazole-trimethoprim 800-160 MG tablet Commonly known as: BACTRIM DS Take 1 tablet by mouth daily. What changed: when to take this   Symbicort 160-4.5 MCG/ACT inhaler Generic drug: budesonide-formoterol Inhale 2 puffs into the lungs 2 (two) times daily.   Ventolin HFA 108 (90 Base) MCG/ACT inhaler Generic drug: albuterol Inhale 2 puffs into the lungs every 6 (six) hours as needed for wheezing or shortness of breath.   zolpidem 10 MG tablet Commonly known as: AMBIEN Take 10 mg by mouth at bedtime as needed for sleep.      Follow-up Information     [name], FNP. Schedule an appointment as soon as possible for a visit in 1 week(s).   Specialty: Family Medicine Contact information: [place] [address] [phone]          Allergies  Allergen Reactions  . Morphine And Related Other (See Comments)    Stomach pain  . Aspirin Other (See Comments)    STOMACH PAIN  . Clonidine Derivatives Itching  . Tramadol Itching  . Tylenol [Acetaminophen] Nausea Only    Stomach ache    Consultations:  Nephrology    Procedures/Studies: XR Chest Portable  Result Date: 07/17/2019 CLINICAL DATA:  Tachycardia, shortness of breath, hyperkalemia, dialysis patient, hypertension, COPD, colorectal cancer, bony metastases EXAM: PORTABLE CHEST 1 VIEW COMPARISON:  Portable exam 1425 hours compared to 07/15/2019 FINDINGS: External pacing leads project over chest. Enlargement of cardiac silhouette. Mediastinal contours and pulmonary vascularity normal. Atherosclerotic calcification aorta. Elevation of LEFT diaphragm with LEFT basilar atelectasis.  Remaining lungs clear. Skin fold projects over lateral upper RIGHT hemithorax. No acute infiltrate, pleural effusion or pneumothorax. IMPRESSION: Persistent mild LEFT basilar atelectasis. Enlargement of cardiac silhouette. Electronically Signed   By: Lavonia Dana M.D.   On: 07/17/2019 14:36   DG Chest Port 1 View  Result Date: 07/15/2019 CLINICAL DATA:  Dialysis patient EXAM: PORTABLE CHEST 1 VIEW COMPARISON:  06/08/2019 FINDINGS: Cardiomegaly. Aortic atherosclerosis and extensive vascular calcinosis. Minimal, diffuse interstitial opacity. Unchanged elevation of the left hemidiaphragm the visualized skeletal structures are unremarkable. IMPRESSION: Cardiomegaly with minimal, diffuse interstitial opacity, likely edema. No focal airspace opacity. Electronically Signed   By: Eddie Candle M.D.   On: 07/15/2019 17:07   IR Paracentesis  Result Date: 07/09/2019 INDICATION: History of renal  failure. Cirrhosis. Abdominal distention secondary to recurrent ascites. Request for therapeutic paracentesis. EXAM: ULTRASOUND GUIDED LEFT LOWER QUADRANT PARACENTESIS MEDICATIONS: None. COMPLICATIONS: None immediate. PROCEDURE: Informed written consent was obtained from the patient after a discussion of the risks, benefits and alternatives to treatment. A timeout was performed prior to the initiation of the procedure. Initial ultrasound scanning demonstrates a large amount of ascites within the left lower abdominal quadrant. The left lower abdomen was prepped and draped in the usual sterile fashion. 1% lidocaine was used for local anesthesia. Following this, a 19 gauge, 7-cm, Yueh catheter was introduced. An ultrasound image was saved for documentation purposes. The paracentesis was performed. The catheter was removed and a dressing was applied. The patient tolerated the procedure well without immediate post procedural complication. FINDINGS: A total of approximately 2.1 L of blood-tinged fluid was removed. IMPRESSION: Successful ultrasound-guided paracentesis yielding 2.1 liters of peritoneal fluid. Read by: Ascencion Dike PA-C Electronically Signed   By: Lucrezia Europe M.D.   On: 07/09/2019 10:05       Discharge Exam: Vitals:   07/18/19 0830 07/18/19 1054  BP: (!) 172/70 (!) 167/86  Pulse: 76 73  Resp: 15 14  Temp:    SpO2: 100% 99%    General exam: Appears calm and comfortable  Respiratory system: Clear to auscultation. Respiratory effort normal. No respiratory distress. No conversational dyspnea.  Cardiovascular system: S1 & S2 heard, irregular rhythm, rate 70s. No murmurs. No pedal edema. Gastrointestinal system: Abdomen is nondistended, soft and nontender. Normal bowel sounds heard. Central nervous system: Alert. No focal neurological deficits. Speech clear.  Extremities: Symmetric in appearance  Skin: No rashes, lesions or ulcers on exposed skin  Psychiatry: Stable   The results of  significant diagnostics from this hospitalization (including imaging, microbiology, ancillary and laboratory) are listed below for reference.     Microbiology: Recent Results (from the past 240 hour(s))  Respiratory Panel by RT PCR (Flu A&B, Covid) - Nasopharyngeal Swab     Status: None   Collection Time: 07/14/19  8:04 PM   Specimen: Nasopharyngeal Swab  Result Value Ref Range Status   SARS Coronavirus 2 by RT PCR NEGATIVE NEGATIVE Final    Comment: (NOTE) SARS-CoV-2 target nucleic acids are NOT DETECTED. The SARS-CoV-2 RNA is generally detectable in upper respiratoy specimens during the acute phase of infection. The lowest concentration of SARS-CoV-2 viral copies this assay can detect is 131 copies/mL. A negative result does not preclude SARS-Cov-2 infection and should not be used as the sole basis for treatment or other patient management decisions. A negative result may occur with  improper specimen collection/handling, submission of specimen other than nasopharyngeal swab, presence of viral mutation(s) within the areas targeted by this assay, and inadequate number of  viral copies (<131 copies/mL). A negative result must be combined with clinical observations, patient history, and epidemiological information. The expected result is Negative. Fact Sheet for Patients:  PinkCheek.be Fact Sheet for Healthcare Providers:  GravelBags.it This test is not yet ap proved or cleared by the [place] FDA and  has been authorized for detection and/or diagnosis of SARS-CoV-2 by FDA under an Emergency Use Authorization (EUA). This EUA will remain  in effect (meaning this test can be used) for the duration of the COVID-19 declaration under Section 564(b)(1) of the Act, 21 U.S.C. section 360bbb-3(b)(1), unless the authorization is terminated or revoked sooner.    Influenza A by PCR NEGATIVE NEGATIVE Final   Influenza B by PCR  NEGATIVE NEGATIVE Final    Comment: (NOTE) The Xpert Xpress SARS-CoV-2/FLU/RSV assay is intended as an aid in  the diagnosis of influenza from Nasopharyngeal swab specimens and  should not be used as a sole basis for treatment. Nasal washings and  aspirates are unacceptable for Xpert Xpress SARS-CoV-2/FLU/RSV  testing. Fact Sheet for Patients: PinkCheek.be Fact Sheet for Healthcare Providers: GravelBags.it This test is not yet approved or cleared by the [place] FDA and  has been authorized for detection and/or diagnosis of SARS-CoV-2 by  FDA under an Emergency Use Authorization (EUA). This EUA will remain  in effect (meaning this test can be used) for the duration of the  Covid-19 declaration under Section 564(b)(1) of the Act, 21  U.S.C. section 360bbb-3(b)(1), unless the authorization is  terminated or revoked. Performed at [facility], [place] [address], [place], [place] [address]   SARS CORONAVIRUS 2 (TAT 6-24 HRS) Nasopharyngeal Nasopharyngeal Swab     Status: None   Collection Time: 07/17/19  2:17 PM   Specimen: Nasopharyngeal Swab  Result Value Ref Range Status   SARS Coronavirus 2 NEGATIVE NEGATIVE Final    Comment: (NOTE) SARS-CoV-2 target nucleic acids are NOT DETECTED. The SARS-CoV-2 RNA is generally detectable in upper and lower respiratory specimens during the acute phase of infection. Negative results do not preclude SARS-CoV-2 infection, do not rule out co-infections with other pathogens, and should not be used as the sole basis for treatment or other patient management decisions. Negative results must be combined with clinical observations, patient history, and epidemiological information. The expected result is Negative. Fact Sheet for Patients: SugarRoll.be Fact Sheet for Healthcare Providers: https://www.woods-mathews.com/ This test is not yet approved  or cleared by the [place] FDA and  has been authorized for detection and/or diagnosis of SARS-CoV-2 by FDA under an Emergency Use Authorization (EUA). This EUA will remain  in effect (meaning this test can be used) for the duration of the COVID-19 declaration under Section 56 4(b)(1) of the Act, 21 U.S.C. section 360bbb-3(b)(1), unless the authorization is terminated or revoked sooner. Performed at [facility], [place] [address] [place], [place] [address]      Labs: BNP (last 3 results) Recent Labs    05/29/19 0900  BNP >3,903.0*   Basic Metabolic Panel: Recent Labs  Lab 07/15/19 1115 07/15/19 2019 07/16/19 0454 07/16/19 1250 07/16/19 1624 07/17/19 1400 07/17/19 1425 07/18/19 0332  NA 133* 130* 135  --   --  134* 131* 136  K 7.0* 6.6* 6.3* 4.2 4.7 6.9* 6.6* 5.0  CL 94* 92* 94*  --   --  95* 99 94*  CO2 22 23 24   --   --  20*  --  28  GLUCOSE 87 90 87  --   --  200* 191* 98  BUN 53* 56* 56*  --   --  36* 43* 40*  CREATININE 10.82* 11.61* 11.49*  --   --  8.50* 9.30* 9.08*  CALCIUM 9.9 10.2 9.9  --   --  9.5  --  9.5   Liver Function Tests: Recent Labs  Lab 07/14/19 1555 07/15/19 1115 07/17/19 1400  AST 26 25 43*  ALT 14 11 13   ALKPHOS 94 79 76  BILITOT 0.6 0.9 0.8  PROT 6.7 6.4* 6.7  ALBUMIN 3.5 3.7 3.5   Recent Labs  Lab 07/14/19 1555 07/15/19 1115  LIPASE 25 20   No results for input(s): AMMONIA in the last 168 hours. CBC: Recent Labs  Lab 07/14/19 1555 07/15/19 1115 07/16/19 0454 07/17/19 1400 07/17/19 1425 07/18/19 0332  WBC 9.3 8.1 8.2 9.6  --  7.7  HGB 9.8* 9.2* 9.6* 10.7* 11.2* 9.6*  HCT 28.0* 26.4* 27.5* 32.8* 33.0* 27.1*  MCV 78.4* 77.4* 77.7* 83.0  --  77.2*  PLT 175 155 167 186  --  151   Cardiac Enzymes: No results for input(s): CKTOTAL, CKMB, CKMBINDEX, TROPONINI in the last 168 hours. BNP: Invalid input(s): POCBNP CBG: Recent Labs  Lab 07/14/19 2008 07/14/19 2049 07/15/19 2114 07/17/19 1351  GLUCAP 33* 63* 72  190*   D-Dimer No results for input(s): DDIMER in the last 72 hours. Hgb A1c No results for input(s): HGBA1C in the last 72 hours. Lipid Profile No results for input(s): CHOL, HDL, LDLCALC, TRIG, CHOLHDL, LDLDIRECT in the last 72 hours. Thyroid function studies No results for input(s): TSH, T4TOTAL, T3FREE, THYROIDAB in the last 72 hours.  Invalid input(s): FREET3 Anemia work up No results for input(s): VITAMINB12, FOLATE, FERRITIN, TIBC, IRON, RETICCTPCT in the last 72 hours. Urinalysis    Component Value Date/Time   COLORURINE YELLOW 07/16/2011 1619   APPEARANCEUR CLEAR 07/16/2011 1619   LABSPEC 1.018 07/16/2011 1619   PHURINE 7.5 07/16/2011 1619   GLUCOSEU 250 (A) 07/16/2011 1619   HGBUR MODERATE (A) 07/16/2011 1619   BILIRUBINUR SMALL (A) 07/16/2011 1619   KETONESUR NEGATIVE 07/16/2011 1619   PROTEINUR >300 (A) 07/16/2011 1619   UROBILINOGEN 1.0 07/16/2011 1619   NITRITE NEGATIVE 07/16/2011 1619   LEUKOCYTESUR SMALL (A) 07/16/2011 1619   Sepsis Labs Invalid input(s): PROCALCITONIN,  WBC,  LACTICIDVEN Microbiology Recent Results (from the past 240 hour(s))  Respiratory Panel by RT PCR (Flu A&B, Covid) - Nasopharyngeal Swab     Status: None   Collection Time: 07/14/19  8:04 PM   Specimen: Nasopharyngeal Swab  Result Value Ref Range Status   SARS Coronavirus 2 by RT PCR NEGATIVE NEGATIVE Final    Comment: (NOTE) SARS-CoV-2 target nucleic acids are NOT DETECTED. The SARS-CoV-2 RNA is generally detectable in upper respiratoy specimens during the acute phase of infection. The lowest concentration of SARS-CoV-2 viral copies this assay can detect is 131 copies/mL. A negative result does not preclude SARS-Cov-2 infection and should not be used as the sole basis for treatment or other patient management decisions. A negative result may occur with  improper specimen collection/handling, submission of specimen other than nasopharyngeal swab, presence of viral mutation(s)  within the areas targeted by this assay, and inadequate number of viral copies (<131 copies/mL). A negative result must be combined with clinical observations, patient history, and epidemiological information. The expected result is Negative. Fact Sheet for Patients:  PinkCheek.be Fact Sheet for Healthcare Providers:  GravelBags.it This test is not yet ap proved or cleared by the [place]  States FDA and  has been authorized for detection and/or diagnosis of SARS-CoV-2 by FDA under an Emergency Use Authorization (EUA). This EUA will remain  in effect (meaning this test can be used) for the duration of the COVID-19 declaration under Section 564(b)(1) of the Act, 21 U.S.C. section 360bbb-3(b)(1), unless the authorization is terminated or revoked sooner.    Influenza A by PCR NEGATIVE NEGATIVE Final   Influenza B by PCR NEGATIVE NEGATIVE Final    Comment: (NOTE) The Xpert Xpress SARS-CoV-2/FLU/RSV assay is intended as an aid in  the diagnosis of influenza from Nasopharyngeal swab specimens and  should not be used as a sole basis for treatment. Nasal washings and  aspirates are unacceptable for Xpert Xpress SARS-CoV-2/FLU/RSV  testing. Fact Sheet for Patients: PinkCheek.be Fact Sheet for Healthcare Providers: GravelBags.it This test is not yet approved or cleared by the [place] FDA and  has been authorized for detection and/or diagnosis of SARS-CoV-2 by  FDA under an Emergency Use Authorization (EUA). This EUA will remain  in effect (meaning this test can be used) for the duration of the  Covid-19 declaration under Section 564(b)(1) of the Act, 21  U.S.C. section 360bbb-3(b)(1), unless the authorization is  terminated or revoked. Performed at [facility], [place] [address], [place], [place] [address]   SARS CORONAVIRUS 2 (TAT 6-24 HRS) Nasopharyngeal  Nasopharyngeal Swab     Status: None   Collection Time: 07/17/19  2:17 PM   Specimen: Nasopharyngeal Swab  Result Value Ref Range Status   SARS Coronavirus 2 NEGATIVE NEGATIVE Final    Comment: (NOTE) SARS-CoV-2 target nucleic acids are NOT DETECTED. The SARS-CoV-2 RNA is generally detectable in upper and lower respiratory specimens during the acute phase of infection. Negative results do not preclude SARS-CoV-2 infection, do not rule out co-infections with other pathogens, and should not be used as the sole basis for treatment or other patient management decisions. Negative results must be combined with clinical observations, patient history, and epidemiological information. The expected result is Negative. Fact Sheet for Patients: SugarRoll.be Fact Sheet for Healthcare Providers: https://www.woods-mathews.com/ This test is not yet approved or cleared by the [place] FDA and  has been authorized for detection and/or diagnosis of SARS-CoV-2 by FDA under an Emergency Use Authorization (EUA). This EUA will remain  in effect (meaning this test can be used) for the duration of the COVID-19 declaration under Section 56 4(b)(1) of the Act, 21 U.S.C. section 360bbb-3(b)(1), unless the authorization is terminated or revoked sooner. Performed at [facility], [place] [address], [place], [place] [address]      Patient was seen and examined on the day of discharge and was found to be in stable condition. Time coordinating discharge: 25 minutes including assessment and coordination of care, as well as examination of the patient.   SIGNED:  Dessa Phi, DO [facility] 07/18/2019, 11:46 AM

## 2019-07-18 NOTE — Care Management Obs Status (Signed)
[facility] NOTIFICATION   Patient Details  Name: [name] MRN: [id number] Date of Birth: 1960-09-01   Medicare Observation Status Notification Given:  Yes    Fuller Mandril, RN 07/18/2019, 12:03 PM

## 2019-07-18 NOTE — Telephone Encounter (Signed)
Transition of care contact from inpatient facility  Date of Discharge: 07/16/2019, then readmitted OBS 1/5-07/18/2019, d/c again this morning Date of Contact: 07/18/2019 - attempted Method of contact: Phone  Attempted to contact patient to discuss transition of carefrom inpatient admission. Patient did not answer the phone. Emphasized importance of coming to dialysis tomorrow. Message was left on the patient's voicemail with call back number [phone].  Veneta Penton, PA-C [employer] Pager [phone]

## 2019-07-18 NOTE — ED Notes (Signed)
Pt discharge instructions reviewed with pt. Pt verbalized understanding of instructions. Pt discharged.

## 2019-07-18 NOTE — Progress Notes (Signed)
PROGRESS NOTE    [name]  FTD:[id number] DOB: 1960/11/23 DOA: 07/17/2019 PCP: [name], FNP     Brief Narrative:  [name] is a 59 y.o. male with medical history significant of ESRD, medical noncompliance, hepatitis B and C with decompensated cirrhosis, ascites requiring regular paracentesis, COPD, hx PAF. He was admitted on 07/15/2019 due to SOB and abdominal distention, was admitted due to hyperkalemia, pulmonary edema, and required HD during hospitalization. He signed out [place] on 07/16/2019 and now returns due to shortness of breath.  He denies any fevers, cough, chest pain.  Does admit to some nausea and vomiting.  Does not make any urine. In the ED, labs significant for K 6.6, Cr 9.3, CXR shows persistent mild left basilar atelectasis. Nephrology consulted for HD.   He was also in A. fib RVR, given 1 dose IV Cardizem and resumed on home Lopressor with improvement in his heart rate.  New events last 24 hours / Subjective: States that he is sleeping, has no further complaints.  Assessment & Plan:   Principal Problem:   Hyperkalemia Active Problems:   Chronic hepatitis B (HCC)   Chronic hepatitis C without hepatic coma (HCC)   Essential hypertension   Anemia   ESRD on dialysis ([place])   DNR (do not resuscitate)   Liver cirrhosis ([place])   A-fib ([place])     Hyperkalemia with noncompliance with HD -Given calcium gluconate, insulin/dextrose, bicarb, lokelma in the ED. Underwent urgent dialysis overnight.  Hyperkalemia resolved. -Nephrology following for HD  Chronic diastolic CHF -Volume management via HD   Chronic hepatitis B and C with cirrhosis and ascites -Last paracentesis 12/28 -PPI, lactulose, bactrim for SBP prophylaxis (on hold due to hyperkalemia)   Paroxysmal A Fib with RVR -Continue metoprolol -Heart rate improved in the 70s today  HTN -Continue hydralazine, metoprolol   Chronic anemia of chronic kidney disease -Stable    Elevated troponin -Likely due to demand ischemia in setting of ESRD and A. fib RVR, denies any chest pain this morning   DVT prophylaxis: Subcutaneous heparin Code Status: DNR Family Communication: No family at bedside Disposition Plan: Pending further improvement and clearance for discharge by nephrology service   Consultants:   Nephrology  Procedures:   None  Antimicrobials:  Anti-infectives (From admission, onward)   None        Objective: Vitals:   07/18/19 0730 07/18/19 0745 07/18/19 0815 07/18/19 0830  BP: (!) 174/89 (!) 164/86 (!) 168/94 (!) 172/70  Pulse: 71 74 75 76  Resp: 12 11 15 15   Temp:      TempSrc:      SpO2: 100% 100% 100% 100%    Intake/Output Summary (Last 24 hours) at 07/18/2019 0956 Last data filed at 07/18/2019 0615 Gross per 24 hour  Intake -  Output 3012 ml  Net -3012 ml   Filed Weights    Examination:  General exam: Appears calm and comfortable  Respiratory system: Clear to auscultation. Respiratory effort normal. No respiratory distress. No conversational dyspnea.  Cardiovascular system: S1 & S2 heard, irregular rhythm, rate 70s. No murmurs. No pedal edema. Gastrointestinal system: Abdomen is nondistended, soft and nontender. Normal bowel sounds heard. Central nervous system: Alert. No focal neurological deficits. Speech clear.  Extremities: Symmetric in appearance  Skin: No rashes, lesions or ulcers on exposed skin  Psychiatry: Stable  Data Reviewed: I have personally reviewed following labs and imaging studies  CBC: Recent Labs  Lab 07/14/19 1555 07/15/19 1115 07/16/19 0454  07/17/19 1400 07/17/19 1425 07/18/19 0332  WBC 9.3 8.1 8.2 9.6  --  7.7  HGB 9.8* 9.2* 9.6* 10.7* 11.2* 9.6*  HCT 28.0* 26.4* 27.5* 32.8* 33.0* 27.1*  MCV 78.4* 77.4* 77.7* 83.0  --  77.2*  PLT 175 155 167 186  --  557   Basic Metabolic Panel: Recent Labs  Lab 07/15/19 1115 07/15/19 2019 07/16/19 0454 07/16/19 1250 07/16/19 1624 07/17/19  1400 07/17/19 1425 07/18/19 0332  NA 133* 130* 135  --   --  134* 131* 136  K 7.0* 6.6* 6.3* 4.2 4.7 6.9* 6.6* 5.0  CL 94* 92* 94*  --   --  95* 99 94*  CO2 22 23 24   --   --  20*  --  28  GLUCOSE 87 90 87  --   --  200* 191* 98  BUN 53* 56* 56*  --   --  36* 43* 40*  CREATININE 10.82* 11.61* 11.49*  --   --  8.50* 9.30* 9.08*  CALCIUM 9.9 10.2 9.9  --   --  9.5  --  9.5   GFR: Estimated Creatinine Clearance: 8.4 mL/min (A) (by C-G formula based on SCr of 9.08 mg/dL (H)). Liver Function Tests: Recent Labs  Lab 07/14/19 1555 07/15/19 1115 07/17/19 1400  AST 26 25 43*  ALT 14 11 13   ALKPHOS 94 79 76  BILITOT 0.6 0.9 0.8  PROT 6.7 6.4* 6.7  ALBUMIN 3.5 3.7 3.5   Recent Labs  Lab 07/14/19 1555 07/15/19 1115  LIPASE 25 20   No results for input(s): AMMONIA in the last 168 hours. Coagulation Profile: Recent Labs  Lab 07/17/19 1422  INR 1.2   Cardiac Enzymes: No results for input(s): CKTOTAL, CKMB, CKMBINDEX, TROPONINI in the last 168 hours. BNP (last 3 results) No results for input(s): PROBNP in the last 8760 hours. HbA1C: No results for input(s): HGBA1C in the last 72 hours. CBG: Recent Labs  Lab 07/14/19 2008 07/14/19 2049 07/15/19 2114 07/17/19 1351  GLUCAP 33* 63* 72 190*   Lipid Profile: No results for input(s): CHOL, HDL, LDLCALC, TRIG, CHOLHDL, LDLDIRECT in the last 72 hours. Thyroid Function Tests: No results for input(s): TSH, T4TOTAL, FREET4, T3FREE, THYROIDAB in the last 72 hours. Anemia Panel: No results for input(s): VITAMINB12, FOLATE, FERRITIN, TIBC, IRON, RETICCTPCT in the last 72 hours. Sepsis Labs: No results for input(s): PROCALCITON, LATICACIDVEN in the last 168 hours.  Recent Results (from the past 240 hour(s))  Respiratory Panel by RT PCR (Flu A&B, Covid) - Nasopharyngeal Swab     Status: None   Collection Time: 07/14/19  8:04 PM   Specimen: Nasopharyngeal Swab  Result Value Ref Range Status   SARS Coronavirus 2 by RT PCR NEGATIVE  NEGATIVE Final    Comment: (NOTE) SARS-CoV-2 target nucleic acids are NOT DETECTED. The SARS-CoV-2 RNA is generally detectable in upper respiratoy specimens during the acute phase of infection. The lowest concentration of SARS-CoV-2 viral copies this assay can detect is 131 copies/mL. A negative result does not preclude SARS-Cov-2 infection and should not be used as the sole basis for treatment or other patient management decisions. A negative result may occur with  improper specimen collection/handling, submission of specimen other than nasopharyngeal swab, presence of viral mutation(s) within the areas targeted by this assay, and inadequate number of viral copies (<131 copies/mL). A negative result must be combined with clinical observations, patient history, and epidemiological information. The expected result is Negative. Fact Sheet for Patients:  PinkCheek.be  Fact Sheet for Healthcare Providers:  GravelBags.it This test is not yet ap proved or cleared by the [place] FDA and  has been authorized for detection and/or diagnosis of SARS-CoV-2 by FDA under an Emergency Use Authorization (EUA). This EUA will remain  in effect (meaning this test can be used) for the duration of the COVID-19 declaration under Section 564(b)(1) of the Act, 21 U.S.C. section 360bbb-3(b)(1), unless the authorization is terminated or revoked sooner.    Influenza A by PCR NEGATIVE NEGATIVE Final   Influenza B by PCR NEGATIVE NEGATIVE Final    Comment: (NOTE) The Xpert Xpress SARS-CoV-2/FLU/RSV assay is intended as an aid in  the diagnosis of influenza from Nasopharyngeal swab specimens and  should not be used as a sole basis for treatment. Nasal washings and  aspirates are unacceptable for Xpert Xpress SARS-CoV-2/FLU/RSV  testing. Fact Sheet for Patients: PinkCheek.be Fact Sheet for Healthcare Providers:  GravelBags.it This test is not yet approved or cleared by the [place] FDA and  has been authorized for detection and/or diagnosis of SARS-CoV-2 by  FDA under an Emergency Use Authorization (EUA). This EUA will remain  in effect (meaning this test can be used) for the duration of the  Covid-19 declaration under Section 564(b)(1) of the Act, 21  U.S.C. section 360bbb-3(b)(1), unless the authorization is  terminated or revoked. Performed at [facility], [place] [address] [place], [place] [address]   SARS CORONAVIRUS 2 (TAT 6-24 HRS) Nasopharyngeal Nasopharyngeal Swab     Status: None   Collection Time: 07/17/19  2:17 PM   Specimen: Nasopharyngeal Swab  Result Value Ref Range Status   SARS Coronavirus 2 NEGATIVE NEGATIVE Final    Comment: (NOTE) SARS-CoV-2 target nucleic acids are NOT DETECTED. The SARS-CoV-2 RNA is generally detectable in upper and lower respiratory specimens during the acute phase of infection. Negative results do not preclude SARS-CoV-2 infection, do not rule out co-infections with other pathogens, and should not be used as the sole basis for treatment or other patient management decisions. Negative results must be combined with clinical observations, patient history, and epidemiological information. The expected result is Negative. Fact Sheet for Patients: SugarRoll.be Fact Sheet for Healthcare Providers: https://www.woods-mathews.com/ This test is not yet approved or cleared by the [place] FDA and  has been authorized for detection and/or diagnosis of SARS-CoV-2 by FDA under an Emergency Use Authorization (EUA). This EUA will remain  in effect (meaning this test can be used) for the duration of the COVID-19 declaration under Section 56 4(b)(1) of the Act, 21 U.S.C. section 360bbb-3(b)(1), unless the authorization is terminated or revoked sooner. Performed at [facility], [place] [address], [place], [place] [address]       Radiology Studies: XR Chest Portable  Result Date: 07/17/2019 CLINICAL DATA:  Tachycardia, shortness of breath, hyperkalemia, dialysis patient, hypertension, COPD, colorectal cancer, bony metastases EXAM: PORTABLE CHEST 1 VIEW COMPARISON:  Portable exam 1425 hours compared to 07/15/2019 FINDINGS: External pacing leads project over chest. Enlargement of cardiac silhouette. Mediastinal contours and pulmonary vascularity normal. Atherosclerotic calcification aorta. Elevation of LEFT diaphragm with LEFT basilar atelectasis. Remaining lungs clear. Skin fold projects over lateral upper RIGHT hemithorax. No acute infiltrate, pleural effusion or pneumothorax. IMPRESSION: Persistent mild LEFT basilar atelectasis. Enlargement of cardiac silhouette. Electronically Signed   By: Lavonia Dana M.D.   On: 07/17/2019 14:36      Scheduled Meds: . Chlorhexidine Gluconate Cloth  6 each Topical Q0600  . fluticasone furoate-vilanterol  1 puff Inhalation Daily  . heparin  5,000 Units Subcutaneous Q8H  . hydrALAZINE  50 mg Oral TID  . lactulose  30 g Oral BID  . metoprolol tartrate  100 mg Oral BID  . montelukast  10 mg Oral QPM  . pantoprazole  40 mg Oral Daily  . sodium chloride flush  3 mL Intravenous Q12H  . sodium chloride flush  3 mL Intravenous Q12H   Continuous Infusions: . sodium chloride Stopped (07/17/19 2329)  . sodium chloride    . sodium chloride    . sodium chloride       LOS: 1 day      Time spent: 25 minutes   Dessa Phi, DO [facility] 07/18/2019, 9:56 AM   Available via Epic secure chat 7am-7pm After these hours, please refer to coverage provider listed on [email or web address]

## 2019-07-18 NOTE — Progress Notes (Signed)
[facility] Progress Note   Subjective: No complaints at present. Waiting for DC. Says he will not leave without taking Lokelma.     Objective Vitals:   07/18/19 0745 07/18/19 0815 07/18/19 0830 07/18/19 1054  BP: (!) 164/86 (!) 168/94 (!) 172/70 (!) 167/86  Pulse: 74 75 76 73  Resp: 11 15 15 14   Temp:      TempSrc:      SpO2: 100% 100% 100% 99%   Physical Exam General: Chronically ill appearing male in NAD Heart: S1,S2 RRR. SR/ST on monitor.  Lungs: CTAB slightly decreased in bases. RR 14-24, no WOB Abdomen: protuberant, probable ascites present. Active BS Extremities: No LE edema Dialysis Access: R AVG + bruit  Dialysis Orders:  Additional Objective Labs: Basic Metabolic Panel: Recent Labs  Lab 07/16/19 0454 07/17/19 1400 07/17/19 1425 07/18/19 0332  NA 135 134* 131* 136  K 6.3* 6.9* 6.6* 5.0  CL 94* 95* 99 94*  CO2 24 20*  --  28  GLUCOSE 87 200* 191* 98  BUN 56* 36* 43* 40*  CREATININE 11.49* 8.50* 9.30* 9.08*  CALCIUM 9.9 9.5  --  9.5   Liver Function Tests: Recent Labs  Lab 07/14/19 1555 07/15/19 1115 07/17/19 1400  AST 26 25 43*  ALT 14 11 13   ALKPHOS 94 79 76  BILITOT 0.6 0.9 0.8  PROT 6.7 6.4* 6.7  ALBUMIN 3.5 3.7 3.5   Recent Labs  Lab 07/14/19 1555 07/15/19 1115  LIPASE 25 20   CBC: Recent Labs  Lab 07/14/19 1555 07/15/19 1115 07/16/19 0454 07/17/19 1400 07/17/19 1425 07/18/19 0332  WBC 9.3 8.1 8.2 9.6  --  7.7  HGB 9.8* 9.2* 9.6* 10.7* 11.2* 9.6*  HCT 28.0* 26.4* 27.5* 32.8* 33.0* 27.1*  MCV 78.4* 77.4* 77.7* 83.0  --  77.2*  PLT 175 155 167 186  --  151   Blood Culture    Component Value Date/Time   SDES ABDOMEN FLUID 05/14/2019 0659   SPECREQUEST NONE 05/14/2019 0659   CULT  05/14/2019 0659    NO GROWTH 3 DAYS Performed at [facility], [place] [address] [place], [place] [address]    REPTSTATUS 05/17/2019 FINAL 05/14/2019 0659    Cardiac Enzymes: No results for input(s): CKTOTAL, CKMB,  CKMBINDEX, TROPONINI in the last 168 hours. CBG: Recent Labs  Lab 07/14/19 2008 07/14/19 2049 07/15/19 2114 07/17/19 1351  GLUCAP 33* 63* 72 190*   Iron Studies: No results for input(s): IRON, TIBC, TRANSFERRIN, FERRITIN in the last 72 hours. @lablastinr3 @ Studies/Results: XR Chest Portable  Result Date: 07/17/2019 CLINICAL DATA:  Tachycardia, shortness of breath, hyperkalemia, dialysis patient, hypertension, COPD, colorectal cancer, bony metastases EXAM: PORTABLE CHEST 1 VIEW COMPARISON:  Portable exam 1425 hours compared to 07/15/2019 FINDINGS: External pacing leads project over chest. Enlargement of cardiac silhouette. Mediastinal contours and pulmonary vascularity normal. Atherosclerotic calcification aorta. Elevation of LEFT diaphragm with LEFT basilar atelectasis. Remaining lungs clear. Skin fold projects over lateral upper RIGHT hemithorax. No acute infiltrate, pleural effusion or pneumothorax. IMPRESSION: Persistent mild LEFT basilar atelectasis. Enlargement of cardiac silhouette. Electronically Signed   By: Lavonia Dana M.D.   On: 07/17/2019 14:36   Medications: . sodium chloride Stopped (07/17/19 2329)  . sodium chloride    . sodium chloride    . sodium chloride     . Chlorhexidine Gluconate Cloth  6 each Topical Q0600  . fluticasone furoate-vilanterol  1 puff Inhalation Daily  . heparin  5,000 Units Subcutaneous Q8H  .  hydrALAZINE  50 mg Oral TID  . lactulose  30 g Oral BID  . metoprolol tartrate  100 mg Oral BID  . montelukast  10 mg Oral QPM  . pantoprazole  40 mg Oral Daily  . sodium chloride flush  3 mL Intravenous Q12H  . sodium chloride flush  3 mL Intravenous Q12H  . sodium zirconium cyclosilicate  10 g Oral Once     HD orders: [facility] M[facility] 4 hrs 180NRe 450/800 65 kg 2.0 K/ 2.0 Ca AVG -No heparin -Parsabiv 2.5 mg IV TIW -Mircera 100 mcg IV q 2 weeks (last dose 06/29/2019    Assessment/Plan: 1. Hyperkalemia-K+6.6 on adm. Had HD last PM, K+ still 5.0. Swears he  is not eating high potassium foods. Concerned for issues with AVG. Will refer to [facility] for shuntogram as OP. Give Lokelma 10G prior to DC today.  2. ESRD -MWF HD. Had HD last PM and finished this AM so technically has had HD today. Has appt with GI at [facility] 07/19/19 he insists he has to keep. Hopefully he will show up for HD at center Friday.  3. Anemia - HGB 9.6. No recent ESA, not needed now. Will order OP ESA.  4. Secondary hyperparathyroidism - Ca 9.5 continue orders, Parsabiv not on hospital formulary.  5. HTN/volume -Does not seem to be volume overloaded by exam or CXR. HD last PM Net UF 3 liters, no weights done since adm.BP better controlled this admission, continue home meds.  6. Nutrition -Renal diet 7. Chronic hepatitis C 8. Cirrhosis with ascites  Mertha Clyatt H. Margaretann Abate NP-C 07/18/2019, 11:43 AM  [employer] [phone]

## 2019-07-18 NOTE — ED Provider Notes (Signed)
[facility] EMERGENCY DEPARTMENT Provider Note   CSN: [id number] Arrival date & time: 07/17/19  1259     History Chief Complaint  Patient presents with  . Tachycardia    [name] is a 59 y.o. male.  HPI     59 year old male with history of A. fib, ESRD on hemodialysis, hep C cirrhosis, cocaine abuse comes in a chief complaint of feeling unwell.  Triage patient was noted to be in wide-complex tachycardia and brought into the ED that immediately.  Patient denies any chest pain but indicates that he feels weak and dizzy.  His symptoms started earlier today.  Patient was seen in the ER just 2 days ago with hyperkalemia and needed emergent dialysis.  He denies any nausea, vomiting, fevers, chills, substance abuse.  Past Medical History:  Diagnosis Date  . Anemia   . Anxiety   . Arthritis    left shoulder  . Atherosclerosis of aorta ([place])   . Cardiomegaly   . Chest pain    DATE UNKNOWN, C/O PERIODICALLY  . Cocaine abuse ([place])   . COPD exacerbation ([place]) 08/17/2016  . Coronary artery disease    stent 02/22/17  . ESRD (end stage renal disease) on dialysis ([place])    "[name]; MWF" (07/04/2017)  . GERD (gastroesophageal reflux disease)    DATE UNKNOWN  . Hemorrhoids   . Hepatitis B, chronic ([place])   . Hepatitis C   . History of kidney stones   . Hyperkalemia   . Hypertension   . Kidney failure   . Metabolic bone disease    Patient denies  . Mitral stenosis   . Myocardial infarction ([place])   . Pneumonia   . Pulmonary edema   . Solitary rectal ulcer syndrome 07/2017   at [facility] for rectal bleeding  . Tubular adenoma of colon     Patient Active Problem List   Diagnosis Date Noted  . Dyspnea 06/09/2019  . Acquired thrombophilia ([place]) 06/05/2019  . A-fib ([place]) 05/30/2019  . Atrial fibrillation with RVR ([place]) 05/29/2019  . Melena   . Pressure injury of skin 03/09/2019  . Abdominal distention   . Volume overload 12/28/2018  .  Sepsis ([place]) 09/12/2018  . Atherosclerosis of native coronary artery of native heart without angina pectoris 03/11/2018  . Benign neoplasm of cecum   . Benign neoplasm of ascending colon   . Benign neoplasm of descending colon   . Benign neoplasm of rectum   . Paroxysmal atrial fibrillation ([place]) 01/23/2018  . Hx of colonic polyps 01/20/2018  . ESRD (end stage renal disease) ([place]) 11/21/2017  . GERD (gastroesophageal reflux disease) 11/16/2017  . Liver cirrhosis ([place]) 11/15/2017  . DNR (do not resuscitate)   . Palliative care by specialist   . Hyponatremia 11/04/2017  . SBP (spontaneous bacterial peritonitis) ([place]) 10/30/2017  . Liver disease, chronic 10/30/2017  . SOB (shortness of breath)   . Abdominal pain 10/28/2017  . Upper airway cough syndrome with flattening on f/v loop 10/13/17 c/w vcd 10/17/2017  . Elevated diaphragm 10/13/2017  . Ileus ([place]) 09/29/2017  . QT prolongation 09/29/2017  . Malnutrition of moderate degree 09/29/2017  . Sinus congestion 09/03/2017  . Symptomatic anemia 09/02/2017  . Other cirrhosis of liver ([place]) 09/02/2017  . Left bundle branch block 09/02/2017  . Mitral stenosis 09/02/2017  . Hematochezia 07/15/2017  . Wide-complex tachycardia ([place])   . Endotracheally intubated   . ESRD on dialysis ([place]) 07/04/2017  . Acute respiratory failure  with hypoxia ([place]) 06/18/2017  . CKD (chronic kidney disease) stage V requiring chronic dialysis ([place]) 06/18/2017  . History of Cocaine abuse ([place]) 06/18/2017  . Hypertension 06/18/2017  . Infection of AV graft for dialysis ([place]) 06/18/2017  . Anxiety 06/18/2017  . Anemia due to chronic kidney disease 06/18/2017  . Atypical atrial flutter ([place]) 06/18/2017  . Personality disorder ([place]) 06/13/2017  . Cellulitis 06/12/2017  . Adjustment disorder with mixed anxiety and depressed mood 06/10/2017  . Suicidal ideation 06/10/2017  . Arm wound, left, sequela 06/10/2017  . Dyspnea on exertion 05/29/2017  . Tachycardia  05/29/2017  . Hyperkalemia 05/22/2017  . Acute metabolic encephalopathy   . Anemia 04/23/2017  . Ascites 04/23/2017  . COPD (chronic obstructive pulmonary disease) ([place]) 04/23/2017  . Acute on chronic respiratory failure with hypoxia ([place]) 03/25/2017  . Arrhythmia 03/25/2017  . COPD GOLD 0 with flattening on inps f/v  09/27/2016  . Essential hypertension 09/27/2016  . Fluid overload 08/30/2016  . COPD exacerbation ([place]) 08/17/2016  . Hypertensive urgency 08/17/2016  . Respiratory failure ([place]) 08/17/2016  . Problem with dialysis access ([place]) 07/23/2016  . Chronic hepatitis B ([place]) 03/05/2014  . Chronic hepatitis C without hepatic coma ([place]) 03/05/2014  . Internal hemorrhoids with bleeding, swelling and itching 03/05/2014  . Thrombocytopenia ([place]) 03/05/2014  . Chest pain 02/27/2014  . Alcohol abuse 04/14/2009  . Cigarette smoker 04/14/2009  . GANGLION CYST 04/14/2009    Past Surgical History:  Procedure Laterality Date  . A/V FISTULAGRAM Left 05/26/2017   Procedure: A/V FISTULAGRAM;  Surgeon: Conrad Aitkin, MD;  Location: [facility];  Service: Cardiovascular;  Laterality: Left;  . A/V FISTULAGRAM Right 11/18/2017   Procedure: A/V FISTULAGRAM - Right Arm;  Surgeon: Elam Dutch, MD;  Location: [facility];  Service: Cardiovascular;  Laterality: Right;  . APPLICATION OF WOUND VAC Left 06/14/2017   Procedure: APPLICATION OF WOUND VAC;  Surgeon: Katha Cabal, MD;  Location: [facility];  Service: Vascular;  Laterality: Left;  . AV FISTULA PLACEMENT  2012   BELIEVED WAS PLACED IN JUNE  . AV FISTULA PLACEMENT Right 08/09/2017   Procedure: Creation Right arm ARTERIOVENOUS BRACHIOCEPOHALIC FISTULA;  Surgeon: Elam Dutch, MD;  Location: [facility] OR;  Service: Vascular;  Laterality: Right;  . AV FISTULA PLACEMENT Right 11/22/2017   Procedure: INSERTION OF ARTERIOVENOUS (AV) GORE-TEX GRAFT RIGHT UPPER ARM;  Surgeon: Elam Dutch, MD;  Location: [facility];  Service:  Vascular;  Laterality: Right;  . BIOPSY  01/25/2018   Procedure: BIOPSY;  Surgeon: Jerene Bears, MD;  Location: [facility];  Service: Gastroenterology;;  . BIOPSY  04/10/2019   Procedure: BIOPSY;  Surgeon: Jerene Bears, MD;  Location: [facility];  Service: Gastroenterology;;  . COLONOSCOPY    . COLONOSCOPY WITH PROPOFOL N/A 01/25/2018   Procedure: COLONOSCOPY WITH PROPOFOL;  Surgeon: Jerene Bears, MD;  Location: [facility];  Service: Gastroenterology;  Laterality: N/A;  . CORONARY STENT INTERVENTION N/A 02/22/2017   Procedure: CORONARY STENT INTERVENTION;  Surgeon: Nigel Mormon, MD;  Location: [facility];  Service: Cardiovascular;  Laterality: N/A;  . ESOPHAGOGASTRODUODENOSCOPY (EGD) WITH PROPOFOL N/A 01/25/2018   Procedure: ESOPHAGOGASTRODUODENOSCOPY (EGD) WITH PROPOFOL;  Surgeon: Jerene Bears, MD;  Location: [facility];  Service: Gastroenterology;  Laterality: N/A;  . ESOPHAGOGASTRODUODENOSCOPY (EGD) WITH PROPOFOL N/A 04/10/2019   Procedure: ESOPHAGOGASTRODUODENOSCOPY (EGD) WITH PROPOFOL;  Surgeon: Jerene Bears, MD;  Location: [facility];  Service: Gastroenterology;  Laterality: N/A;  .  FLEXIBLE SIGMOIDOSCOPY N/A 07/15/2017   Procedure: FLEXIBLE SIGMOIDOSCOPY;  Surgeon: Carol Ada, MD;  Location: [facility];  Service: Endoscopy;  Laterality: N/A;  . HEMORRHOID BANDING    . I & D EXTREMITY Left 06/01/2017   Procedure: IRRIGATION AND DEBRIDEMENT LEFT ARM HEMATOMA WITH LIGATION OF LEFT ARM AV FISTULA;  Surgeon: Elam Dutch, MD;  Location: [facility];  Service: Vascular;  Laterality: Left;  . I & D EXTREMITY Left 06/14/2017   Procedure: IRRIGATION AND DEBRIDEMENT EXTREMITY;  Surgeon: Katha Cabal, MD;  Location: [facility];  Service: Vascular;  Laterality: Left;  . INSERTION OF DIALYSIS CATHETER  05/30/2017  . INSERTION OF DIALYSIS CATHETER N/A 05/30/2017   Procedure: INSERTION OF DIALYSIS CATHETER;  Surgeon: Elam Dutch, MD;  Location: [facility];  Service:  Vascular;  Laterality: N/A;  . IR PARACENTESIS  08/30/2017  . IR PARACENTESIS  09/29/2017  . IR PARACENTESIS  10/28/2017  . IR PARACENTESIS  11/09/2017  . IR PARACENTESIS  11/16/2017  . IR PARACENTESIS  11/28/2017  . IR PARACENTESIS  12/01/2017  . IR PARACENTESIS  12/06/2017  . IR PARACENTESIS  01/03/2018  . IR PARACENTESIS  01/23/2018  . IR PARACENTESIS  02/07/2018  . IR PARACENTESIS  02/21/2018  . IR PARACENTESIS  03/06/2018  . IR PARACENTESIS  03/17/2018  . IR PARACENTESIS  04/04/2018  . IR PARACENTESIS  12/28/2018  . IR PARACENTESIS  01/08/2019  . IR PARACENTESIS  01/23/2019  . IR PARACENTESIS  02/01/2019  . IR PARACENTESIS  02/19/2019  . IR PARACENTESIS  03/01/2019  . IR PARACENTESIS  03/15/2019  . IR PARACENTESIS  04/03/2019  . IR PARACENTESIS  04/12/2019  . IR PARACENTESIS  05/01/2019  . IR PARACENTESIS  05/08/2019  . IR PARACENTESIS  05/24/2019  . IR PARACENTESIS  06/12/2019  . IR PARACENTESIS  07/09/2019  . IR RADIOLOGIST EVAL & MGMT  02/14/2018  . IR RADIOLOGIST EVAL & MGMT  02/22/2019  . LEFT HEART CATH AND CORONARY ANGIOGRAPHY N/A 02/22/2017   Procedure: LEFT HEART CATH AND CORONARY ANGIOGRAPHY;  Surgeon: Nigel Mormon, MD;  Location: [facility];  Service: Cardiovascular;  Laterality: N/A;  . LIGATION OF ARTERIOVENOUS  FISTULA Left 03/18/2951   Procedure: Plication of Left Arm Arteriovenous Fistula;  Surgeon: Elam Dutch, MD;  Location: [facility];  Service: Vascular;  Laterality: Left;  . POLYPECTOMY    . POLYPECTOMY  01/25/2018   Procedure: POLYPECTOMY;  Surgeon: Jerene Bears, MD;  Location: [facility];  Service: Gastroenterology;;  . REVISON OF ARTERIOVENOUS FISTULA Left 8/41/3244   Procedure: PLICATION OF DISTAL ANEURYSMAL SEGEMENT OF LEFT UPPER ARM ARTERIOVENOUS FISTULA;  Surgeon: Elam Dutch, MD;  Location: [facility];  Service: Vascular;  Laterality: Left;  . REVISON OF ARTERIOVENOUS FISTULA Left 0/04/2724   Procedure: Plication of Left Upper Arm Fistula ;  Surgeon: Waynetta Sandy, MD;  Location: [facility];  Service: Vascular;  Laterality: Left;  . SKIN GRAFT SPLIT THICKNESS LEG / FOOT Left    SKIN GRAFT SPLIT THICKNESS LEFT ARM DONOR SITE: LEFT ANTERIOR THIGH  . SKIN SPLIT GRAFT Left 07/04/2017   Procedure: SKIN GRAFT SPLIT THICKNESS LEFT ARM DONOR SITE: LEFT ANTERIOR THIGH;  Surgeon: Elam Dutch, MD;  Location: [facility];  Service: Vascular;  Laterality: Left;  . THROMBECTOMY W/ EMBOLECTOMY Left 06/05/2017   Procedure: EXPLORATION OF LEFT ARM FOR BLEEDING; OVERSEWED PROXIMAL FISTULA;  Surgeon: Angelia Mould, MD;  Location: [facility];  Service: Vascular;  Laterality: Left;  .  WOUND EXPLORATION Left 06/03/2017   Procedure: WOUND EXPLORATION WITH WOUND VAC APPLICATION TO LEFT ARM;  Surgeon: Angelia Mould, MD;  Location[facility];  Service: Vascular;  Laterality: Left;       Family History  Problem Relation Age of Onset  . Heart disease Mother   . Lung cancer Mother   . Heart disease Father   . Malignant hyperthermia Father   . COPD Father   . Throat cancer Sister   . Esophageal cancer Sister   . Hypertension Other   . COPD Other   . Colon cancer Neg Hx   . Colon polyps Neg Hx   . Rectal cancer Neg Hx   . Stomach cancer Neg Hx     Social History   Tobacco Use  . Smoking status: Current Every Day Smoker    Packs/day: 0.50    Years: 43.00    Pack years: 21.50    Types: Cigarettes    Start date: 08/13/1973  . Smokeless tobacco: Never Used  . Tobacco comment: i dont know i just make   Substance Use Topics  . Alcohol use: Not Currently    Comment: quit drinking in 2017  . Drug use: Not Currently    Types: Marijuana, Cocaine    Comment: reports using once every 3 months, 04-06-2019 was this     Home Medications Prior to Admission medications   Medication Sig Start Date End Date Taking? Authorizing Provider  albuterol (VENTOLIN HFA) 108 (90 Base) MCG/ACT inhaler Inhale 2 puffs into the lungs every 6 (six) hours as needed for  wheezing or shortness of breath.   Yes [provider]  hydrALAZINE (APRESOLINE) 100 MG tablet Take 0.5 tablets (50 mg total) by mouth 3 (three) times daily. 05/31/19  Yes [place]field, Carley D, DO  lactulose (CHRONULAC) 10 GM/15ML solution TAKE 45 MLS BY MOUTH 2 TIMES DAILY. Patient taking differently: Take 30 g by mouth 2 (two) times daily.  06/04/19  Yes Al Decant, MD  loperamide (IMODIUM A-D) 2 MG tablet Take 2 mg by mouth 4 (four) times daily as needed for diarrhea or loose stools.   Yes [provider]  metoprolol tartrate (LOPRESSOR) 100 MG tablet Take 1 tablet (100 mg total) by mouth 2 (two) times daily. 06/04/19  Yes Al Decant, MD  montelukast (SINGULAIR) 10 MG tablet Take 10 mg by mouth every evening.    Yes [provider]  ondansetron (ZOFRAN) 8 MG tablet Take 8 mg by mouth every 4 (four) hours as needed. 06/14/19  Yes [provider]  pantoprazole (PROTONIX) 40 MG tablet Take 1 tablet (40 mg total) by mouth daily. Patient taking differently: Take 40 mg by mouth See admin instructions. Take 40 mg by mouth in the morning before breakfast and an additional 40 mg once a day, if symptoms persist 06/05/19  Yes Maudie Mercury, MD  sevelamer carbonate (RENVELA) 800 MG tablet Take 4 tablets with meals  And 2 tablets twice daily with snacks Patient taking differently: Take 1,600-2,400 mg by mouth See admin instructions. Take 2,400 mg by mouth three times a day with meals and 1,600 mg with each snack 01/22/18  Yes Patrecia Pour, MD  sulfamethoxazole-trimethoprim (BACTRIM DS) 800-160 MG tablet Take 1 tablet by mouth daily. Patient taking differently: Take 1 tablet by mouth 2 (two) times daily.  12/30/18  Yes Vann, Jessica U, DO  SYMBICORT 160-4.5 MCG/ACT inhaler Inhale 2 puffs into the lungs 2 (two) times daily.  11/30/18  Yes [provider]  zolpidem (AMBIEN) 10 MG tablet Take 10 mg by mouth at bedtime as needed for sleep.   Yes [provider]    Allergies    Morphine and related, Aspirin, Clonidine derivatives, Tramadol, and Tylenol [acetaminophen]  Review of Systems   Review of Systems  Constitutional: Positive for activity change.  Respiratory: Negative for shortness of breath.   Cardiovascular: Negative for chest pain.  Neurological: Positive for light-headedness.  Hematological: Does not bruise/bleed easily.  All other systems reviewed and are negative.   Physical Exam Updated Vital Signs BP (!) 164/86   Pulse 74   Temp (!) 97.5 F (36.4 C) (Oral)   Resp 11   SpO2 100%   Physical Exam Vitals and nursing note reviewed.  Constitutional:      Appearance: He is well-developed.  HENT:     Head: Atraumatic.  Cardiovascular:     Rate and Rhythm: Tachycardia present.  Pulmonary:     Effort: Pulmonary effort is normal.  Abdominal:     General: There is distension.     Tenderness: There is no abdominal tenderness.  Musculoskeletal:     Cervical back: Neck supple.  Skin:    General: Skin is warm.  Neurological:     Mental Status: He is alert and oriented to person, place, and time.     ED Results / Procedures / Treatments   Labs (all labs ordered are listed, but only abnormal results are displayed) Labs Reviewed  CBC - Abnormal; Notable for the following components:      Result Value   RBC 3.95 (*)    Hemoglobin 10.7 (*)    HCT 32.8 (*)    All other components within normal limits  COMPREHENSIVE METABOLIC PANEL - Abnormal; Notable for the following components:   Sodium 134 (*)    Potassium 6.9 (*)    Chloride 95 (*)    CO2 20 (*)    Glucose, Bld 200 (*)    BUN 36 (*)    Creatinine, Ser 8.50 (*)    AST 43 (*)    GFR calc non Af Amer 6 (*)    GFR calc Af Amer 7 (*)    Anion gap 19 (*)    All other components within normal limits  PROTIME-INR - Abnormal; Notable for the following components:   Prothrombin Time 15.5 (*)    All other components within normal limits  CBC -  Abnormal; Notable for the following components:   RBC 3.51 (*)    Hemoglobin 9.6 (*)    HCT 27.1 (*)    MCV 77.2 (*)    All other components within normal limits  BASIC METABOLIC PANEL - Abnormal; Notable for the following components:   Chloride 94 (*)    BUN 40 (*)    Creatinine, Ser 9.08 (*)    GFR calc non Af Amer 6 (*)    GFR calc Af Amer 7 (*)    All other components within normal limits  I-STAT CHEM 8, ED - Abnormal; Notable for the following components:   Sodium 131 (*)    Potassium 6.6 (*)    BUN 43 (*)    Creatinine, Ser 9.30 (*)    Glucose, Bld 191 (*)    Calcium, Ion 0.99 (*)    Hemoglobin 11.2 (*)    HCT 33.0 (*)    All other components within normal limits  CBG MONITORING, ED - Abnormal; Notable for the following components:   Glucose-Capillary  190 (*)    All other components within normal limits  TROPONIN I (HIGH SENSITIVITY) - Abnormal; Notable for the following components:   Troponin I (High Sensitivity) 54 (*)    All other components within normal limits  TROPONIN I (HIGH SENSITIVITY) - Abnormal; Notable for the following components:   Troponin I (High Sensitivity) 311 (*)    All other components within normal limits  SARS CORONAVIRUS 2 (TAT 6-24 HRS)  APTT  I-STAT CHEM 8, ED    EKG EKG Interpretation  Date/Time:  Tuesday July 17 2019 14:14:31 EST Ventricular Rate:  66 PR Interval:    QRS Duration: 170 QT Interval:  443 QTC Calculation: 465 R Axis:   -94 Text Interpretation: Sinus rhythm Prolonged PR interval Consider left atrial enlargement Nonspecific IVCD with LAD Consider left ventricular hypertrophy Abnormal T, consider ischemia, lateral leads peaked t wave wide complex tachycardia resolved Confirmed by Varney Biles [phone]) on 07/17/2019 2:58:52 PM   ED ECG REPORT   Date: 07/18/2019  Rate: 157  Rhythm: wide complex tachycardia, likely afib with abberancy  QRS Axis: left  Intervals: indeterminate  ST/T Wave abnormalities: nonspecific  ST/T changes  Conduction Disutrbances:nonspecific intraventricular conduction delay  Narrative Interpretation:   Old EKG Reviewed: changes noted  I have personally reviewed the EKG tracing and agree with the computerized printout as noted.   Radiology XR Chest Portable  Result Date: 07/17/2019 CLINICAL DATA:  Tachycardia, shortness of breath, hyperkalemia, dialysis patient, hypertension, COPD, colorectal cancer, bony metastases EXAM: PORTABLE CHEST 1 VIEW COMPARISON:  Portable exam 1425 hours compared to 07/15/2019 FINDINGS: External pacing leads project over chest. Enlargement of cardiac silhouette. Mediastinal contours and pulmonary vascularity normal. Atherosclerotic calcification aorta. Elevation of LEFT diaphragm with LEFT basilar atelectasis. Remaining lungs clear. Skin fold projects over lateral upper RIGHT hemithorax. No acute infiltrate, pleural effusion or pneumothorax. IMPRESSION: Persistent mild LEFT basilar atelectasis. Enlargement of cardiac silhouette. Electronically Signed   By: Lavonia Dana M.D.   On: 07/17/2019 14:36    Procedures .Critical Care Performed by: Varney Biles, MD Authorized by: Varney Biles, MD   Critical care provider statement:    Critical care time (minutes):  52   Critical care was necessary to treat or prevent imminent or life-threatening deterioration of the following conditions:  Circulatory failure and cardiac failure   Critical care was time spent personally by me on the following activities:  Discussions with consultants, evaluation of patient's response to treatment, examination of patient, ordering and performing treatments and interventions, ordering and review of laboratory studies, ordering and review of radiographic studies, pulse oximetry, re-evaluation of patient's condition, obtaining history from patient or surrogate and review of old charts   (including critical care time)  Medications Ordered in ED Medications  0.9 %  sodium  chloride infusion ( Intravenous Stopped 07/17/19 2329)  Chlorhexidine Gluconate Cloth 2 % PADS 6 each (has no administration in time range)  pentafluoroprop-tetrafluoroeth (GEBAUERS) aerosol 1 application (has no administration in time range)  lidocaine (PF) (XYLOCAINE) 1 % injection 5 mL (has no administration in time range)  lidocaine-prilocaine (EMLA) cream 1 application (has no administration in time range)  0.9 %  sodium chloride infusion (has no administration in time range)  0.9 %  sodium chloride infusion (has no administration in time range)  metoprolol tartrate (LOPRESSOR) tablet 100 mg (0 mg Oral Hold 07/17/19 2330)  hydrALAZINE (APRESOLINE) tablet 50 mg (0 mg Oral Hold 07/17/19 2330)  zolpidem (AMBIEN) tablet 10 mg (has no administration in time range)  lactulose (CHRONULAC) 10 GM/15ML solution 30 g (30 g Oral Refused 07/17/19 2328)  pantoprazole (PROTONIX) EC tablet 40 mg (40 mg Oral Refused 07/17/19 1949)  montelukast (SINGULAIR) tablet 10 mg (10 mg Oral Refused 07/17/19 1949)  fluticasone furoate-vilanterol (BREO ELLIPTA) 200-25 MCG/INH 1 puff (has no administration in time range)  heparin injection 5,000 Units (5,000 Units Subcutaneous Refused 07/17/19 2328)  sodium chloride flush (NS) 0.9 % injection 3 mL (3 mLs Intravenous Given 07/17/19 2106)  sodium chloride flush (NS) 0.9 % injection 3 mL (3 mLs Intravenous Given 07/17/19 2106)  sodium chloride flush (NS) 0.9 % injection 3 mL (has no administration in time range)  0.9 %  sodium chloride infusion (has no administration in time range)  prochlorperazine (COMPAZINE) injection 10 mg (10 mg Intravenous Given 07/17/19 1938)  calcium gluconate inj 10% (1 g) URGENT USE ONLY! ( Intravenous Not Given 07/17/19 1409)  sodium bicarbonate injection 50 mEq (50 mEq Intravenous Given 07/17/19 1408)  insulin aspart (novoLOG) injection 5 Units (5 Units Intravenous Given 07/17/19 1405)    And  dextrose 50 % solution 50 mL (50 mLs Intravenous Given 07/17/19 1407)    sodium bicarbonate injection 50 mEq (50 mEq Intravenous Given 07/17/19 1512)  sodium zirconium cyclosilicate (LOKELMA) packet 10 g (10 g Oral Given 07/17/19 1732)  calcium gluconate 1 g in sodium chloride 0.9 % 100 mL IVPB (0 g Intravenous Stopped 07/17/19 2045)  sodium bicarbonate injection 25 mEq (25 mEq Intravenous Given 07/17/19 1802)  diltiazem (CARDIZEM) injection 10 mg (10 mg Intravenous Given 07/17/19 1800)    ED Course  I have reviewed the triage vital signs and the nursing notes.  Pertinent labs & imaging results that were available during my care of the patient were reviewed by me and considered in my medical decision making (see chart for details).    MDM Rules/Calculators/A&P                       59 year old comes in a chief complaint of weakness.  He is noted to be in wide-complex tachycardia.  He has history of A. fib, ESRD on HD, cocaine abuse and liver cirrhosis.  On exam patient is hemodynamically stable.  He is not having any chest pain or shortness of breath.  His wide-complex tachycardia appears to be A. fib with aberrancy.  Cardiac pads placed on him.  We noted that he was admitted to the hospital for hyperkalemia.  It is possible that the widening of the QRS could be a result of hyperkalemia again therefore we decided to give him hyper K treatment immediately.  With the calcium gluconate, sodium bicarb -patient's wide-complex tachycardia actually resolved.  Repeat EKG still showed peaked T waves and potassium came back at over 6.5.  Nephrology was consulted for emergent dialysis and they are at the bedside.  Patient is feeling better as well.  He will need admission to the hospital per nephrology as he might need long duration hemodialysis or repeat hemodialysis tomorrow.  Final Clinical Impression(s) / ED Diagnoses Final diagnoses:  Stage 5 chronic kidney disease on chronic dialysis ([place])  Hyperkalemia  Tachycardia    Rx / DC Orders ED Discharge Orders    None        Varney Biles, MD 07/18/19 [phone]

## 2019-07-18 NOTE — Care Management CC44 (Signed)
Condition Code 44 Documentation Completed  Patient Details  Name: [name] MRN: [id number] Date of Birth: September 04, 1960   Condition Code 44 given:  Yes Patient signature on Condition Code 44 notice:  Yes Documentation of 2 MD's agreement:  Yes Code 44 added to claim:  Yes    Fuller Mandril, RN 07/18/2019, 12:03 PM

## 2019-07-18 NOTE — ED Notes (Signed)
ED TO INPATIENT HANDOFF REPORT  ED Nurse Name and Phone #: Thurmond Butts Gorman Name/Age/Gender [name] 59 y.o. male Room/Bed: 007C/007C  Code Status   Code Status: DNR  Home/SNF/Other Home Patient oriented to: self, place, time and situation Is this baseline? Yes   Triage Complete: Triage complete  Chief Complaint Hyperkalemia [E87.5]  Triage Note Pt presents for tachycardia just prior arrival, arrives via POV driven by family. Pt had dialysis yesterday, missed Saturday, usual schedule MWF. Upon arrival to exam roo, HR 165bpm, zoll pads applied, IV access obtain and prescribed meds given (see MAR, Ca gluc, bicarb, dextrose, insulin). Pt cardiac monitor now displays afib at 75 bpm, reports decreased shortness of breath corresponding to this change. Updated EKG give to EDP.    Allergies Allergies  Allergen Reactions  . Morphine And Related Other (See Comments)    Stomach pain  . Aspirin Other (See Comments)    STOMACH PAIN  . Clonidine Derivatives Itching  . Tramadol Itching  . Tylenol [Acetaminophen] Nausea Only    Stomach ache    Level of Care/Admitting Diagnosis ED Disposition    ED Disposition Condition [facility] Area: [place] [100100]  Level of Care: Telemetry Medical [104]  Covid Evaluation: Asymptomatic Screening Protocol (No Symptoms)  Diagnosis: Hyperkalemia [829562]  Admitting Physician: Dessa Phi [[id number]]  Attending Physician: Dessa Phi [id number]  Estimated length of stay: past midnight tomorrow  Certification:: I certify this patient will need inpatient services for at least 2 midnights       B Medical/Surgery History Past Medical History:  Diagnosis Date  . Anemia   . Anxiety   . Arthritis    left shoulder  . Atherosclerosis of aorta ([place])   . Cardiomegaly   . Chest pain    DATE UNKNOWN, C/O PERIODICALLY  . Cocaine abuse ([place])   . COPD exacerbation ([place]) 08/17/2016  . Coronary  artery disease    stent 02/22/17  . ESRD (end stage renal disease) on dialysis ([place])    "[name]; MWF" (07/04/2017)  . GERD (gastroesophageal reflux disease)    DATE UNKNOWN  . Hemorrhoids   . Hepatitis B, chronic ([place])   . Hepatitis C   . History of kidney stones   . Hyperkalemia   . Hypertension   . Kidney failure   . Metabolic bone disease    Patient denies  . Mitral stenosis   . Myocardial infarction ([place])   . Pneumonia   . Pulmonary edema   . Solitary rectal ulcer syndrome 07/2017   at flex sig for rectal bleeding  . Tubular adenoma of colon    Past Surgical History:  Procedure Laterality Date  . A/V FISTULAGRAM Left 05/26/2017   Procedure: A/V FISTULAGRAM;  Surgeon: Conrad Paxtonia, MD;  Location: [place] [facility];  Service: Cardiovascular;  Laterality: Left;  . A/V FISTULAGRAM Right 11/18/2017   Procedure: A/V FISTULAGRAM - Right Arm;  Surgeon: Elam Dutch, MD;  Location: [place] [facility];  Service: Cardiovascular;  Laterality: Right;  . APPLICATION OF WOUND VAC Left 06/14/2017   Procedure: APPLICATION OF WOUND VAC;  Surgeon: Katha Cabal, MD;  Location: [facility];  Service: Vascular;  Laterality: Left;  . AV FISTULA PLACEMENT  2012   BELIEVED WAS PLACED IN JUNE  . AV FISTULA PLACEMENT Right 08/09/2017   Procedure: Creation Right arm ARTERIOVENOUS BRACHIOCEPOHALIC FISTULA;  Surgeon: Elam Dutch, MD;  Location: [place];  Service: Vascular;  Laterality: Right;  . AV FISTULA PLACEMENT Right 11/22/2017   Procedure: INSERTION OF ARTERIOVENOUS (AV) GORE-TEX GRAFT RIGHT UPPER ARM;  Surgeon: Elam Dutch, MD;  Location: [place];  Service: Vascular;  Laterality: Right;  . BIOPSY  01/25/2018   Procedure: BIOPSY;  Surgeon: Jerene Bears, MD;  Location: [place];  Service: Gastroenterology;;  . BIOPSY  04/10/2019   Procedure: BIOPSY;  Surgeon: Jerene Bears, MD;  Location: [facility];  Service: Gastroenterology;;  . COLONOSCOPY    . COLONOSCOPY WITH  PROPOFOL N/A 01/25/2018   Procedure: COLONOSCOPY WITH PROPOFOL;  Surgeon: Jerene Bears, MD;  Location: [place];  Service: Gastroenterology;  Laterality: N/A;  . CORONARY STENT INTERVENTION N/A 02/22/2017   Procedure: CORONARY STENT INTERVENTION;  Surgeon: Nigel Mormon, MD;  Location: [place] [facility];  Service: Cardiovascular;  Laterality: N/A;  . ESOPHAGOGASTRODUODENOSCOPY (EGD) WITH PROPOFOL N/A 01/25/2018   Procedure: ESOPHAGOGASTRODUODENOSCOPY (EGD) WITH PROPOFOL;  Surgeon: Jerene Bears, MD;  Location: [place];  Service: Gastroenterology;  Laterality: N/A;  . ESOPHAGOGASTRODUODENOSCOPY (EGD) WITH PROPOFOL N/A 04/10/2019   Procedure: ESOPHAGOGASTRODUODENOSCOPY (EGD) WITH PROPOFOL;  Surgeon: Jerene Bears, MD;  [facility];  Service: Gastroenterology;  Laterality: N/A;  . FLEXIBLE SIGMOIDOSCOPY N/A 07/15/2017   Procedure: FLEXIBLE SIGMOIDOSCOPY;  Surgeon: Carol Ada, MD;  Location: [place];  Service: Endoscopy;  Laterality: N/A;  . HEMORRHOID BANDING    . I & D EXTREMITY Left 06/01/2017   Procedure: IRRIGATION AND DEBRIDEMENT LEFT ARM HEMATOMA WITH LIGATION OF LEFT ARM AV FISTULA;  Surgeon: Elam Dutch, MD;  Location: [place];  Service: Vascular;  Laterality: Left;  . I & D EXTREMITY Left 06/14/2017   Procedure: IRRIGATION AND DEBRIDEMENT EXTREMITY;  Surgeon: Katha Cabal, MD;  Location: [facility];  Service: Vascular;  Laterality: Left;  . INSERTION OF DIALYSIS CATHETER  05/30/2017  . INSERTION OF DIALYSIS CATHETER N/A 05/30/2017   Procedure: INSERTION OF DIALYSIS CATHETER;  Surgeon: Elam Dutch, MD;  Location: [place];  Service: Vascular;  Laterality: N/A;  . IR PARACENTESIS  08/30/2017  . IR PARACENTESIS  09/29/2017  . IR PARACENTESIS  10/28/2017  . IR PARACENTESIS  11/09/2017  . IR PARACENTESIS  11/16/2017  . IR PARACENTESIS  11/28/2017  . IR PARACENTESIS  12/01/2017  . IR PARACENTESIS  12/06/2017  . IR PARACENTESIS  01/03/2018  . IR PARACENTESIS   01/23/2018  . IR PARACENTESIS  02/07/2018  . IR PARACENTESIS  02/21/2018  . IR PARACENTESIS  03/06/2018  . IR PARACENTESIS  03/17/2018  . IR PARACENTESIS  04/04/2018  . IR PARACENTESIS  12/28/2018  . IR PARACENTESIS  01/08/2019  . IR PARACENTESIS  01/23/2019  . IR PARACENTESIS  02/01/2019  . IR PARACENTESIS  02/19/2019  . IR PARACENTESIS  03/01/2019  . IR PARACENTESIS  03/15/2019  . IR PARACENTESIS  04/03/2019  . IR PARACENTESIS  04/12/2019  . IR PARACENTESIS  05/01/2019  . IR PARACENTESIS  05/08/2019  . IR PARACENTESIS  05/24/2019  . IR PARACENTESIS  06/12/2019  . IR PARACENTESIS  07/09/2019  . IR RADIOLOGIST EVAL & MGMT  02/14/2018  . IR RADIOLOGIST EVAL & MGMT  02/22/2019  . LEFT HEART CATH AND CORONARY ANGIOGRAPHY N/A 02/22/2017   Procedure: LEFT HEART CATH AND CORONARY ANGIOGRAPHY;  Surgeon: Nigel Mormon, MD;  Location: [place] [facility];  Service: Cardiovascular;  Laterality: N/A;  . LIGATION OF ARTERIOVENOUS  FISTULA Left 11/16/175   Procedure: Plication of Left Arm Arteriovenous Fistula;  Surgeon: Elam Dutch, MD;  Location: [place];  Service: Vascular;  Laterality: Left;  . POLYPECTOMY    . POLYPECTOMY  01/25/2018   Procedure: POLYPECTOMY;  Surgeon: Jerene Bears, MD;  Location: [place];  Service: Gastroenterology;;  . REVISON OF ARTERIOVENOUS FISTULA Left 7/93/9030   Procedure: PLICATION OF DISTAL ANEURYSMAL SEGEMENT OF LEFT UPPER ARM ARTERIOVENOUS FISTULA;  Surgeon: Elam Dutch, MD;  Location: [place];  Service: Vascular;  Laterality: Left;  . REVISON OF ARTERIOVENOUS FISTULA Left 0/92/3300   Procedure: Plication of Left Upper Arm Fistula ;  Surgeon: Waynetta Sandy, MD;  Location: [place];  Service: Vascular;  Laterality: Left;  . SKIN GRAFT SPLIT THICKNESS LEG / FOOT Left    SKIN GRAFT SPLIT THICKNESS LEFT ARM DONOR SITE: LEFT ANTERIOR THIGH  . SKIN SPLIT GRAFT Left 07/04/2017   Procedure: SKIN GRAFT SPLIT THICKNESS LEFT ARM DONOR SITE: LEFT ANTERIOR THIGH;   Surgeon: Elam Dutch, MD;  Location: [place];  Service: Vascular;  Laterality: Left;  . THROMBECTOMY W/ EMBOLECTOMY Left 06/05/2017   Procedure: EXPLORATION OF LEFT ARM FOR BLEEDING; OVERSEWED PROXIMAL FISTULA;  Surgeon: Angelia Mould, MD;  Location: [place];  Service: Vascular;  Laterality: Left;  . WOUND EXPLORATION Left 06/03/2017   Procedure: WOUND EXPLORATION WITH WOUND VAC APPLICATION TO LEFT ARM;  Surgeon: Angelia Mould, MD;  Location: [place];  Service: Vascular;  Laterality: Left;     A IV Location/Drains/Wounds Patient Lines/Drains/Airways Status   Active Line/Drains/Airways    Name:   Placement date:   Placement time:   Site:   Days:   Peripheral IV 07/17/19 Left;Proximal Forearm   07/17/19    1400    Forearm   1   Fistula / Graft Right Upper arm Arteriovenous vein graft   11/22/17    0836    Upper arm   603   Wound / Incision (Open or Dehisced) 05/29/19 Other (Comment) Finger (Comment which one) Right skin torn at 1st knuckles from being mashed in door   05/29/19    2030    Finger (Comment which one)   50          Intake/Output Last 24 hours  Intake/Output Summary (Last 24 hours) at 07/18/2019 1021 Last data filed at 07/18/2019 0615 Gross per 24 hour  Intake -  Output 3012 ml  Net -3012 ml    Labs/Imaging Results for orders placed or performed during the hospital encounter of 07/17/19 (from the past 48 hour(s))  POC CBG, ED     Status: Abnormal   Collection Time: 07/17/19  1:51 PM  Result Value Ref Range   Glucose-Capillary 190 (H) 70 - 99 mg/dL   Comment 1 Notify RN    Comment 2 Document in Chart   CBC     Status: Abnormal   Collection Time: 07/17/19  2:00 PM  Result Value Ref Range   WBC 9.6 4.0 - 10.5 K/uL   RBC 3.95 (L) 4.22 - 5.81 MIL/uL   Hemoglobin 10.7 (L) 13.0 - 17.0 g/dL   HCT 32.8 (L) 39.0 - 52.0 %   MCV 83.0 80.0 - 100.0 fL   MCH 27.1 26.0 - 34.0 pg   MCHC 32.6 30.0 - 36.0 g/dL   RDW 15.1 11.5 - 15.5 %   Platelets 186 150 - 400  K/uL   nRBC 0.0 0.0 - 0.2 %    Comment: Performed at [facility], [address] [place], [place] [address]  CMET     Status: Abnormal   Collection Time: 07/17/19  2:00 PM  Result Value Ref Range   Sodium 134 (L) 135 - 145 mmol/L   Potassium 6.9 (HH) 3.5 - 5.1 mmol/L    Comment: SLIGHT HEMOLYSIS CRITICAL RESULT CALLED TO, READ BACK BY AND VERIFIED WITH: B.HONEYCUTT RN 1519 07/17/19 MCCORMICK K    Chloride 95 (L) 98 - 111 mmol/L   CO2 20 (L) 22 - 32 mmol/L   Glucose, Bld 200 (H) 70 - 99 mg/dL   BUN 36 (H) 6 - 20 mg/dL   Creatinine, Ser 8.50 (H) 0.61 - 1.24 mg/dL   Calcium 9.5 8.9 - 10.3 mg/dL   Total Protein 6.7 6.5 - 8.1 g/dL   Albumin 3.5 3.5 - 5.0 g/dL   AST 43 (H) 15 - 41 U/L   ALT 13 0 - 44 U/L   Alkaline Phosphatase 76 38 - 126 U/L   Total Bilirubin 0.8 0.3 - 1.2 mg/dL   GFR calc non Af Amer 6 (L) >60 mL/min   GFR calc Af Amer 7 (L) >60 mL/min   Anion gap 19 (H) 5 - 15    Comment: Performed at [facility], [address], [place], [place] [address]  Troponin I (High Sensitivity)     Status: Abnormal   Collection Time: 07/17/19  2:00 PM  Result Value Ref Range   Troponin I (High Sensitivity) 54 (H) <18 ng/L    Comment: (NOTE) Elevated high sensitivity troponin I (hsTnI) values and significant  changes across serial measurements may suggest ACS but many other  chronic and acute conditions are known to elevate hsTnI results.  Refer to the Links section for chest pain algorithms and additional  guidance. Performed at [facility], [place] [address] [place], [place] [address]   SARS CORONAVIRUS 2 (TAT 6-24 HRS) Nasopharyngeal Nasopharyngeal Swab     Status: None   Collection Time: 07/17/19  2:17 PM   Specimen: Nasopharyngeal Swab  Result Value Ref Range   SARS Coronavirus 2 NEGATIVE NEGATIVE    Comment: (NOTE) SARS-CoV-2 target nucleic acids are NOT DETECTED. The SARS-CoV-2 RNA is generally detectable in upper and lower respiratory specimens during  the acute phase of infection. Negative results do not preclude SARS-CoV-2 infection, do not rule out co-infections with other pathogens, and should not be used as the sole basis for treatment or other patient management decisions. Negative results must be combined with clinical observations, patient history, and epidemiological information. The expected result is Negative. Fact Sheet for Patients: SugarRoll.be Fact Sheet for Healthcare Providers: https://www.woods-mathews.com/ This test is not yet approved or cleared by the [place] FDA and  has been authorized for detection and/or diagnosis of SARS-CoV-2 by FDA under an Emergency Use Authorization (EUA). This EUA will remain  in effect (meaning this test can be used) for the duration of the COVID-19 declaration under Section 56 4(b)(1) of the Act, 21 U.S.C. section 360bbb-3(b)(1), unless the authorization is terminated or revoked sooner. Performed at [facility], [place] [address] [place], [place] [address]   Protime-INR     Status: Abnormal   Collection Time: 07/17/19  2:22 PM  Result Value Ref Range   Prothrombin Time 15.5 (H) 11.4 - 15.2 seconds   INR 1.2 0.8 - 1.2    Comment: (NOTE) INR goal varies based on device and disease states. Performed at [facility], [place] [address], [place], [place] [address]   APTT  Status: None   Collection Time: 07/17/19  2:22 PM  Result Value Ref Range   aPTT 30 24 - 36 seconds    Comment: Performed at [facility], [place] [address] [place], [place] [address]  I-stat chem 8, ED (not at [facility] or [facility])     Status: Abnormal   Collection Time: 07/17/19  2:25 PM  Result Value Ref Range   Sodium 131 (L) 135 - 145 mmol/L   Potassium 6.6 (HH) 3.5 - 5.1 mmol/L   Chloride 99 98 - 111 mmol/L   BUN 43 (H) 6 - 20 mg/dL   Creatinine, Ser 9.30 (H) 0.61 - 1.24 mg/dL   Glucose, Bld 191 (H) 70 - 99 mg/dL   Calcium, Ion 0.99 (L) 1.15 - 1.40  mmol/L   TCO2 23 22 - 32 mmol/L   Hemoglobin 11.2 (L) 13.0 - 17.0 g/dL   HCT 33.0 (L) 39.0 - 52.0 %   Comment NOTIFIED PHYSICIAN   Troponin I (High Sensitivity)     Status: Abnormal   Collection Time: 07/17/19  5:30 PM  Result Value Ref Range   Troponin I (High Sensitivity) 311 (HH) <18 ng/L    Comment: CRITICAL RESULT CALLED TO, READ BACK BY AND VERIFIED WITH: H.MASHBURN RN 1846 07/17/19 MCCORMICK K (NOTE) Elevated high sensitivity troponin I (hsTnI) values and significant  changes across serial measurements may suggest ACS but many other  chronic and acute conditions are known to elevate hsTnI results.  Refer to the Links section for chest pain algorithms and additional  guidance. Performed at [facility], [place] [address] [place], [place] [address]   CBC     Status: Abnormal   Collection Time: 07/18/19  3:32 AM  Result Value Ref Range   WBC 7.7 4.0 - 10.5 K/uL   RBC 3.51 (L) 4.22 - 5.81 MIL/uL   Hemoglobin 9.6 (L) 13.0 - 17.0 g/dL   HCT 27.1 (L) 39.0 - 52.0 %   MCV 77.2 (L) 80.0 - 100.0 fL    Comment: REPEATED TO VERIFY DELTA CHECK NOTED    MCH 27.4 26.0 - 34.0 pg   MCHC 35.4 30.0 - 36.0 g/dL   RDW 14.4 11.5 - 15.5 %   Platelets 151 150 - 400 K/uL   nRBC 0.0 0.0 - 0.2 %    Comment: Performed at [facility], [place] [address], [place], [place] [address]  Basic metabolic panel     Status: Abnormal   Collection Time: 07/18/19  3:32 AM  Result Value Ref Range   Sodium 136 135 - 145 mmol/L   Potassium 5.0 3.5 - 5.1 mmol/L   Chloride 94 (L) 98 - 111 mmol/L   CO2 28 22 - 32 mmol/L   Glucose, Bld 98 70 - 99 mg/dL   BUN 40 (H) 6 - 20 mg/dL   Creatinine, Ser 9.08 (H) 0.61 - 1.24 mg/dL   Calcium 9.5 8.9 - 10.3 mg/dL   GFR calc non Af Amer 6 (L) >60 mL/min   GFR calc Af Amer 7 (L) >60 mL/min   Anion gap 14 5 - 15    Comment: Performed at [facility], [place] [address] [place], [place] [address]   XR Chest Portable  Result Date: 07/17/2019 CLINICAL DATA:   Tachycardia, shortness of breath, hyperkalemia, dialysis patient, hypertension, COPD, colorectal cancer, bony metastases EXAM: PORTABLE CHEST 1 VIEW COMPARISON:  Portable exam 1425 hours compared to 07/15/2019 FINDINGS: External pacing leads project over chest. Enlargement  of cardiac silhouette. Mediastinal contours and pulmonary vascularity normal. Atherosclerotic calcification aorta. Elevation of LEFT diaphragm with LEFT basilar atelectasis. Remaining lungs clear. Skin fold projects over lateral upper RIGHT hemithorax. No acute infiltrate, pleural effusion or pneumothorax. IMPRESSION: Persistent mild LEFT basilar atelectasis. Enlargement of cardiac silhouette. Electronically Signed   By: Lavonia Dana M.D.   On: 07/17/2019 14:36    Pending Labs Unresulted Labs (From admission, onward)    Start     Ordered   07/19/19 0500  BMP tmrw AM  Tomorrow morning,   R     07/18/19 0959          Vitals/Pain Today's Vitals   07/18/19 0730 07/18/19 0745 07/18/19 0815 07/18/19 0830  BP: (!) 174/89 (!) 164/86 (!) 168/94 (!) 172/70  Pulse: 71 74 75 76  Resp: 12 11 15 15   Temp:      TempSrc:      SpO2: 100% 100% 100% 100%  PainSc:        Isolation Precautions No active isolations  Medications Medications  0.9 %  sodium chloride infusion ( Intravenous Stopped 07/17/19 2329)  Chlorhexidine Gluconate Cloth 2 % PADS 6 each (has no administration in time range)  pentafluoroprop-tetrafluoroeth (GEBAUERS) aerosol 1 application (has no administration in time range)  lidocaine (PF) (XYLOCAINE) 1 % injection 5 mL (has no administration in time range)  lidocaine-prilocaine (EMLA) cream 1 application (has no administration in time range)  0.9 %  sodium chloride infusion (has no administration in time range)  0.9 %  sodium chloride infusion (has no administration in time range)  metoprolol tartrate (LOPRESSOR) tablet 100 mg (0 mg Oral Hold 07/17/19 2330)  hydrALAZINE (APRESOLINE) tablet 50 mg (0 mg Oral Hold  07/17/19 2330)  zolpidem (AMBIEN) tablet 10 mg (has no administration in time range)  lactulose (CHRONULAC) 10 GM/15ML solution 30 g (30 g Oral Refused 07/17/19 2328)  pantoprazole (PROTONIX) EC tablet 40 mg (40 mg Oral Refused 07/17/19 1949)  montelukast (SINGULAIR) tablet 10 mg (10 mg Oral Refused 07/17/19 1949)  fluticasone furoate-vilanterol (BREO ELLIPTA) 200-25 MCG/INH 1 puff (has no administration in time range)  heparin injection 5,000 Units (5,000 Units Subcutaneous Refused 07/17/19 2328)  sodium chloride flush (NS) 0.9 % injection 3 mL (3 mLs Intravenous Given 07/17/19 2106)  sodium chloride flush (NS) 0.9 % injection 3 mL (3 mLs Intravenous Given 07/17/19 2106)  sodium chloride flush (NS) 0.9 % injection 3 mL (has no administration in time range)  0.9 %  sodium chloride infusion (has no administration in time range)  prochlorperazine (COMPAZINE) injection 10 mg (10 mg Intravenous Given 07/17/19 1938)  calcium gluconate inj 10% (1 g) URGENT USE ONLY! ( Intravenous Not Given 07/17/19 1409)  sodium bicarbonate injection 50 mEq (50 mEq Intravenous Given 07/17/19 1408)  insulin aspart (novoLOG) injection 5 Units (5 Units Intravenous Given 07/17/19 1405)    And  dextrose 50 % solution 50 mL (50 mLs Intravenous Given 07/17/19 1407)  sodium bicarbonate injection 50 mEq (50 mEq Intravenous Given 07/17/19 1512)  sodium zirconium cyclosilicate (LOKELMA) packet 10 g (10 g Oral Given 07/17/19 1732)  calcium gluconate 1 g in sodium chloride 0.9 % 100 mL IVPB (0 g Intravenous Stopped 07/17/19 2045)  sodium bicarbonate injection 25 mEq (25 mEq Intravenous Given 07/17/19 1802)  diltiazem (CARDIZEM) injection 10 mg (10 mg Intravenous Given 07/17/19 1800)    Mobility walks     Focused Assessments    R Recommendations: See Admitting Provider Note  Report given to:  Additional Notes:

## 2019-07-19 ENCOUNTER — Telehealth: Payer: Self-pay | Admitting: Nephrology

## 2019-07-19 NOTE — Discharge Summary (Signed)
Physician Discharge Summary  [name] NFA:[id number] DOB: 1961-02-19  PCP: [name], FNP   The patient left the hospital Against Medical Advise.   Admit date: 07/15/2019 Discharge date: Left AMA on 07/16/2019  Recommendations for Outpatient Follow-up:  None made because patient had already left AMA.    Home Health: N/A Equipment/Devices: N/A  Discharge Condition: Guarded and at risk for decline. CODE STATUS: Full Diet recommendation: N/A  Discharge Diagnoses:  Principal Problem:   Hyperkalemia Active Problems:   COPD (chronic obstructive pulmonary disease) (HCC)   Anemia due to chronic kidney disease   Other cirrhosis of liver (HCC)   QT prolongation   ESRD (end stage renal disease) (HCC)   Paroxysmal atrial fibrillation (HCC)   Volume overload   Brief Summary: 59 year old male with PMH of hepatitis B and C with decompensated cirrhosis, ascites requiring regular paracentesis, ESRD on HD, HTN, chronic anemia, COPD, MI, PAF, medical noncompliance, [name], recently in ED with hyperkalemia at which time nephrology consulted and planned inpatient Diaz but patient left AMA, presented with complaints of abdominal distention and dyspnea.  BP 200/100 in ED.  Potassium 7.    Assessment & Plan:   Hyperkalemia  Likely secondary to missing HD.  Given Kayexelate and IV calcium in ED.  EKG with chronic LBBB unchanged to prior.  Nephrology consulted, unable to dialyze last night due to high volume of patients but underwent HD this morning.  Cleared from nephrology perspective for discharge, continue outpatient HD.  Resolved.  ESRD on HD  Nephrology consulted and management as above.  Acute on chronic diastolic CHF  Complicated by ESRD with noncompliance to HD and by cirrhosis.  Volume management across HD.  Cardiology consulted and will defer to them regarding repeating TTE.  He left before being evaluated by his primary Cardiologist with whom  I had discussed his case in detail.  Hypertensive urgency  Complicated by noncompliance and volume overload.  Continue prior home dose of metoprolol 100 mg twice daily and hydralazine 50 mg 3 times daily.   Blood pressures widely fluctuating.   Cirrhosis with ascites  Attributed to hepatitis B and C.  Has history of SBP.  Abdomen mildly distended but no indications for urgent paracentesis.  Last had paracentesis on 12/28.  Continue PPI, lactulose, Bactrim for SBP prophylaxis at discharge.  Bactrim held overnight due to hyperkalemia.  Patient refuses many of his medications.  Paroxysmal A. fib with RVR  Reportedly in sinus rhythm on admission.  Briefly on morning of leaving AMA, wide-complex tachycardia in the 150s which may be his A. fib with RVR but cannot rule out NSVT.  Subsequently reverted to rate controlled wide-complex, A. Fib.  Continued metoprolol.  Likely not a candidate for anticoagulation due to noncompliance and GI bleed.  I had consulted and discussed with Cardiology but he left before being seen by them. I again communicated with Cardiology and requested their office arrange OP follow up.   COPD  No clinical bronchospasm.  Anemia of chronic kidney disease and chronic disease.  Stable.  Hypoglycemia  Noted on 1/2.  Monitor CBGs.   Consultants:   Cardiology/Dr. Will Bonnet. Nephrology.  Procedures:   HD 1/4   Discharge Instructions  Unable, because he left AMA before provider was informed.  Allergies  Allergen Reactions  . Morphine And Related Other (See Comments)    Stomach pain  . Aspirin Other (See Comments)    STOMACH PAIN  . Clonidine Derivatives Itching  . Tramadol Itching  .  Tylenol [Acetaminophen] Nausea Only    Stomach ache      Procedures/Studies:  Result Date: 07/15/2019 CLINICAL DATA:  Diaz patient EXAM: PORTABLE CHEST 1 VIEW COMPARISON:  06/08/2019 FINDINGS: Cardiomegaly. Aortic atherosclerosis and extensive  vascular calcinosis. Minimal, diffuse interstitial opacity. Unchanged elevation of the left hemidiaphragm the visualized skeletal structures are unremarkable. IMPRESSION: Cardiomegaly with minimal, diffuse interstitial opacity, likely edema. No focal airspace opacity. Electronically Signed   By: Eddie Candle M.D.   On: 07/15/2019 17:07   The results of significant diagnostics from this hospitalization (including imaging, microbiology, ancillary and laboratory) are listed below for reference.     Microbiology: Recent Results (from the past 240 hour(s))  Respiratory Panel by RT PCR (Flu A&B, Covid) - Nasopharyngeal Swab     Status: None   Collection Time: 07/14/19  8:04 PM   Specimen: Nasopharyngeal Swab  Result Value Ref Range Status   SARS Coronavirus 2 by RT PCR NEGATIVE NEGATIVE Final    Comment: (NOTE) SARS-CoV-2 target nucleic acids are NOT DETECTED. The SARS-CoV-2 RNA is generally detectable in upper respiratoy specimens during the acute phase of infection. The lowest concentration of SARS-CoV-2 viral copies this assay can detect is 131 copies/mL. A negative result does not preclude SARS-Cov-2 infection and should not be used as the sole basis for treatment or other patient management decisions. A negative result may occur with  improper specimen collection/handling, submission of specimen other than nasopharyngeal swab, presence of viral mutation(s) within the areas targeted by this assay, and inadequate number of viral copies (<131 copies/mL). A negative result must be combined with clinical observations, patient history, and epidemiological information. The expected result is Negative. Fact Sheet for Patients:  PinkCheek.be Fact Sheet for Healthcare Providers:  GravelBags.it This test is not yet ap proved or cleared by the [place] FDA and  has been authorized for detection and/or diagnosis of SARS-CoV-2 by FDA  under an Emergency Use Authorization (EUA). This EUA will remain  in effect (meaning this test can be used) for the duration of the COVID-19 declaration under Section 564(b)(1) of the Act, 21 U.S.C. section 360bbb-3(b)(1), unless the authorization is terminated or revoked sooner.    Influenza A by PCR NEGATIVE NEGATIVE Final   Influenza B by PCR NEGATIVE NEGATIVE Final    Comment: (NOTE) The Xpert Xpress SARS-CoV-2/FLU/RSV assay is intended as an aid in  the diagnosis of influenza from Nasopharyngeal swab specimens and  should not be used as a sole basis for treatment. Nasal washings and  aspirates are unacceptable for Xpert Xpress SARS-CoV-2/FLU/RSV  testing. Fact Sheet for Patients: PinkCheek.be Fact Sheet for Healthcare Providers: GravelBags.it This test is not yet approved or cleared by the [place] FDA and  has been authorized for detection and/or diagnosis of SARS-CoV-2 by  FDA under an Emergency Use Authorization (EUA). This EUA will remain  in effect (meaning this test can be used) for the duration of the  Covid-19 declaration under Section 564(b)(1) of the Act, 21  U.S.C. section 360bbb-3(b)(1), unless the authorization is  terminated or revoked. Performed at [facility], [place] [address] [place], [place] [address]   SARS CORONAVIRUS 2 (TAT 6-24 HRS) Nasopharyngeal Nasopharyngeal Swab     Status: None   Collection Time: 07/17/19  2:17 PM   Specimen: Nasopharyngeal Swab  Result Value Ref Range Status   SARS Coronavirus 2 NEGATIVE NEGATIVE Final    Comment: (NOTE) SARS-CoV-2 target nucleic acids are NOT DETECTED. The SARS-CoV-2 RNA is generally detectable in  upper and lower respiratory specimens during the acute phase of infection. Negative results do not preclude SARS-CoV-2 infection, do not rule out co-infections with other pathogens, and should not be used as the sole basis for treatment or other  patient management decisions. Negative results must be combined with clinical observations, patient history, and epidemiological information. The expected result is Negative. Fact Sheet for Patients: SugarRoll.be Fact Sheet for Healthcare Providers: https://www.woods-mathews.com/ This test is not yet approved or cleared by the [place] FDA and  has been authorized for detection and/or diagnosis of SARS-CoV-2 by FDA under an Emergency Use Authorization (EUA). This EUA will remain  in effect (meaning this test can be used) for the duration of the COVID-19 declaration under Section 56 4(b)(1) of the Act, 21 U.S.C. section 360bbb-3(b)(1), unless the authorization is terminated or revoked sooner. Performed at [facility], [place] [address], [place], [place] [address]      Labs: CBC: Recent Labs  Lab 07/14/19 1555 07/15/19 1115 07/16/19 0454 07/17/19 1400 07/17/19 1425 07/18/19 0332  WBC 9.3 8.1 8.2 9.6  --  7.7  HGB 9.8* 9.2* 9.6* 10.7* 11.2* 9.6*  HCT 28.0* 26.4* 27.5* 32.8* 33.0* 27.1*  MCV 78.4* 77.4* 77.7* 83.0  --  77.2*  PLT 175 155 167 186  --  505    Basic Metabolic Panel: Recent Labs  Lab 07/15/19 1115 07/15/19 2019 07/16/19 0454 07/16/19 1250 07/16/19 1624 07/17/19 1400 07/17/19 1425 07/18/19 0332  NA 133* 130* 135  --   --  134* 131* 136  K 7.0* 6.6* 6.3* 4.2 4.7 6.9* 6.6* 5.0  CL 94* 92* 94*  --   --  95* 99 94*  CO2 22 23 24   --   --  20*  --  28  GLUCOSE 87 90 87  --   --  200* 191* 98  BUN 53* 56* 56*  --   --  36* 43* 40*  CREATININE 10.82* 11.61* 11.49*  --   --  8.50* 9.30* 9.08*  CALCIUM 9.9 10.2 9.9  --   --  9.5  --  9.5    Liver Function Tests: Recent Labs  Lab 07/14/19 1555 07/15/19 1115 07/17/19 1400  AST 26 25 43*  ALT 14 11 13   ALKPHOS 94 79 76  BILITOT 0.6 0.9 0.8  PROT 6.7 6.4* 6.7  ALBUMIN 3.5 3.7 3.5    CBG: Recent Labs  Lab 07/14/19 2008 07/14/19 2049 07/15/19 2114  07/17/19 1351  GLUCAP 33* 63* 72 190*    Time coordinating discharge: 15 minutes  SIGNED:  Vernell Leep, MD, FACP, [facility]. [facility]  To contact the attending provider between 7A-7P or the covering provider during after hours 7P-7A, please log into the web site [email or web address] and access using universal [place] password for that web site. If you do not have the password, please call the hospital operator.

## 2019-07-19 NOTE — Telephone Encounter (Signed)
Transition of care contact from inpatient facility  Date of Discharge: 07/16/2019, then readmitted OBS 1/5-07/18/2019 Date of Contact: 07/19/2019 Method of contact: Phone  Attempted to contact patient to discuss transition of carefrom inpatient admission. Patient did not answer the phone. Message was left on the patient's voicemail with call back number [phone].

## 2019-07-20 ENCOUNTER — Telehealth: Payer: Medicare Other | Admitting: Cardiology

## 2019-07-22 ENCOUNTER — Emergency Department (HOSPITAL_COMMUNITY)
Admission: EM | Admit: 2019-07-22 | Discharge: 2019-07-22 | Disposition: A | Discharge status: Home / Self Care | Payer: Medicare Other | Attending: Emergency Medicine | Admitting: Emergency Medicine

## 2019-07-22 ENCOUNTER — Encounter (HOSPITAL_COMMUNITY): Payer: Self-pay | Admitting: Emergency Medicine

## 2019-07-22 ENCOUNTER — Emergency Department (HOSPITAL_COMMUNITY): Payer: Medicare Other

## 2019-07-22 ENCOUNTER — Other Ambulatory Visit: Payer: Self-pay

## 2019-07-22 DIAGNOSIS — N186 End stage renal disease: Secondary | ICD-10-CM | POA: Insufficient documentation

## 2019-07-22 DIAGNOSIS — I4891 Unspecified atrial fibrillation: Secondary | ICD-10-CM

## 2019-07-22 DIAGNOSIS — I2510 Atherosclerotic heart disease of native coronary artery without angina pectoris: Secondary | ICD-10-CM | POA: Diagnosis not present

## 2019-07-22 DIAGNOSIS — F17210 Nicotine dependence, cigarettes, uncomplicated: Secondary | ICD-10-CM | POA: Diagnosis not present

## 2019-07-22 DIAGNOSIS — Z992 Dependence on renal dialysis: Secondary | ICD-10-CM | POA: Insufficient documentation

## 2019-07-22 DIAGNOSIS — Z79899 Other long term (current) drug therapy: Secondary | ICD-10-CM | POA: Diagnosis not present

## 2019-07-22 DIAGNOSIS — J449 Chronic obstructive pulmonary disease, unspecified: Secondary | ICD-10-CM | POA: Diagnosis not present

## 2019-07-22 DIAGNOSIS — R002 Palpitations: Secondary | ICD-10-CM | POA: Diagnosis present

## 2019-07-22 DIAGNOSIS — I252 Old myocardial infarction: Secondary | ICD-10-CM | POA: Diagnosis not present

## 2019-07-22 DIAGNOSIS — I120 Hypertensive chronic kidney disease with stage 5 chronic kidney disease or end stage renal disease: Secondary | ICD-10-CM | POA: Insufficient documentation

## 2019-07-22 LAB — I-STAT CHEM 8, ED
BUN: 32 mg/dL — ABNORMAL HIGH (ref 6–20)
Calcium, Ion: 1.08 mmol/L — ABNORMAL LOW (ref 1.15–1.40)
Chloride: 98 mmol/L (ref 98–111)
Creatinine, Ser: 9.1 mg/dL — ABNORMAL HIGH (ref 0.61–1.24)
Glucose, Bld: 183 mg/dL — ABNORMAL HIGH (ref 70–99)
HCT: 32 % — ABNORMAL LOW (ref 39.0–52.0)
Hemoglobin: 10.9 g/dL — ABNORMAL LOW (ref 13.0–17.0)
Potassium: 5 mmol/L (ref 3.5–5.1)
Sodium: 132 mmol/L — ABNORMAL LOW (ref 135–145)
TCO2: 25 mmol/L (ref 22–32)

## 2019-07-22 LAB — BASIC METABOLIC PANEL
Anion gap: 15 (ref 5–15)
BUN: 33 mg/dL — ABNORMAL HIGH (ref 6–20)
CO2: 23 mmol/L (ref 22–32)
Calcium: 9.7 mg/dL (ref 8.9–10.3)
Chloride: 95 mmol/L — ABNORMAL LOW (ref 98–111)
Creatinine, Ser: 8.59 mg/dL — ABNORMAL HIGH (ref 0.61–1.24)
GFR calc Af Amer: 7 mL/min — ABNORMAL LOW (ref >60)
GFR calc non Af Amer: 6 mL/min — ABNORMAL LOW (ref >60)
Glucose, Bld: 188 mg/dL — ABNORMAL HIGH (ref 70–99)
Potassium: 5.1 mmol/L (ref 3.5–5.1)
Sodium: 133 mmol/L — ABNORMAL LOW (ref 135–145)

## 2019-07-22 LAB — CBC WITH DIFFERENTIAL/PLATELET
Abs Immature Granulocytes: 0.03 10*3/uL (ref 0.00–0.07)
Basophils Absolute: 0.1 10*3/uL (ref 0.0–0.1)
Basophils Relative: 2 %
Eosinophils Absolute: 0.1 10*3/uL (ref 0.0–0.5)
Eosinophils Relative: 2 %
HCT: 29 % — ABNORMAL LOW (ref 39.0–52.0)
Hemoglobin: 10.1 g/dL — ABNORMAL LOW (ref 13.0–17.0)
Immature Granulocytes: 0 %
Lymphocytes Relative: 23 %
Lymphs Abs: 1.9 10*3/uL (ref 0.7–4.0)
MCH: 27.7 pg (ref 26.0–34.0)
MCHC: 34.8 g/dL (ref 30.0–36.0)
MCV: 79.5 fL — ABNORMAL LOW (ref 80.0–100.0)
Monocytes Absolute: 0.5 10*3/uL (ref 0.1–1.0)
Monocytes Relative: 7 %
Neutro Abs: 5.4 10*3/uL (ref 1.7–7.7)
Neutrophils Relative %: 66 %
Platelets: 152 10*3/uL (ref 150–400)
RBC: 3.65 MIL/uL — ABNORMAL LOW (ref 4.22–5.81)
RDW: 15.4 % (ref 11.5–15.5)
WBC: 8.1 10*3/uL (ref 4.0–10.5)
nRBC: 0 % (ref 0.0–0.2)

## 2019-07-22 LAB — MAGNESIUM: Magnesium: 1.8 mg/dL (ref 1.7–2.4)

## 2019-07-22 LAB — TROPONIN I (HIGH SENSITIVITY)
Troponin I (High Sensitivity): 114 ng/L (ref <18)
Troponin I (High Sensitivity): 115 ng/L (ref <18)

## 2019-07-22 MED ORDER — INSULIN ASPART 100 UNIT/ML IV SOLN
5.0000 [IU] | Freq: Once | INTRAVENOUS | Status: AC
  Administered 2019-07-22: 5 [IU] via INTRAVENOUS

## 2019-07-22 MED ORDER — LORAZEPAM 1 MG PO TABS
1.0000 mg | ORAL_TABLET | Freq: Once | ORAL | Status: AC
  Administered 2019-07-22: 1 mg via ORAL
  Filled 2019-07-22: qty 1

## 2019-07-22 MED ORDER — ALBUTEROL SULFATE HFA 108 (90 BASE) MCG/ACT IN AERS
2.0000 | INHALATION_SPRAY | Freq: Once | RESPIRATORY_TRACT | Status: AC
  Administered 2019-07-22: 10:00:00 2 via RESPIRATORY_TRACT
  Filled 2019-07-22: qty 6.7

## 2019-07-22 MED ORDER — AMIODARONE HCL 200 MG PO TABS: 200.0000 mg | ORAL_TABLET | Freq: Every day | ORAL | 0 refills | Status: DC

## 2019-07-22 MED ORDER — AMIODARONE HCL 200 MG PO TABS
400.0000 mg | ORAL_TABLET | Freq: Once | ORAL | Status: AC
  Administered 2019-07-22: 400 mg via ORAL
  Filled 2019-07-22: qty 2

## 2019-07-22 MED ORDER — FENTANYL CITRATE (PF) 100 MCG/2ML IJ SOLN
INTRAMUSCULAR | Status: AC
  Filled 2019-07-22: qty 2

## 2019-07-22 MED ORDER — CALCIUM GLUCONATE-NACL 1-0.675 GM/50ML-% IV SOLN
1.0000 g | Freq: Once | INTRAVENOUS | Status: AC
  Administered 2019-07-22: 1000 mg via INTRAVENOUS
  Filled 2019-07-22: qty 50

## 2019-07-22 MED ORDER — FENTANYL CITRATE (PF) 100 MCG/2ML IJ SOLN
50.0000 ug | Freq: Once | INTRAMUSCULAR | Status: AC
  Administered 2019-07-22: 50 ug via INTRAVENOUS

## 2019-07-22 MED ORDER — DEXTROSE 50 % IV SOLN
1.0000 | Freq: Once | INTRAVENOUS | Status: AC
  Administered 2019-07-22: 10:00:00 50 mL via INTRAVENOUS
  Filled 2019-07-22: qty 50

## 2019-07-22 MED ORDER — METOPROLOL TARTRATE 25 MG PO TABS
100.0000 mg | ORAL_TABLET | Freq: Once | ORAL | Status: DC
  Filled 2019-07-22: qty 4

## 2019-07-22 NOTE — ED Notes (Signed)
Patient verbalizes understanding of discharge instructions. Opportunity for questioning and answers were provided. Armband removed by staff, pt discharged from ED.  

## 2019-07-22 NOTE — ED Triage Notes (Signed)
Pt arrives to ED with [facility] from home with complaints of sudden shortness of breath starting this morning. Per EMS patient was in [place] on their arrival. EMS gave patient 150mg  amiodarone and 4mg  of zofran. Patient has not missed dialysis this week. Patient stated that he ate over 10 prunes yesterday because he felt constipated.

## 2019-07-22 NOTE — ED Provider Notes (Signed)
[facility] EMERGENCY DEPARTMENT Provider Note   CSN: [id number] Arrival date & time: 07/22/19  4008     History Chief Complaint  Patient presents with  . Ventricular Tachycardia    [name] is a 59 y.o. male.  Presents to the emergency department with concern for palpitations.  Patient states he was in his normal state of health, not having symptoms today when suddenly he felt examined palpitations and feeling somewhat short of breath.  States feels this way whenever he goes into fast A. fib.  EMS concern for possible ventricular tachycardia and gave patient dose of amiodarone.  Time of eval in ER, patient reports that the symptoms have resolved.  States he deals with chronic abdominal pain, chronic abdominal distention, no changes today.  Denies any missed dialysis sessions, received full dialysis session on Friday.  States he is intending to go to dialysis tomorrow.  Denies missing any of his medications today.  HPI     Past Medical History:  Diagnosis Date  . Anemia   . Anxiety   . Arthritis    left shoulder  . Atherosclerosis of aorta ([place])   . Cardiomegaly   . Chest pain    DATE UNKNOWN, C/O PERIODICALLY  . Cocaine abuse ([place])   . COPD exacerbation ([place]) 08/17/2016  . Coronary artery disease    stent 02/22/17  . ESRD (end stage renal disease) on dialysis ([place])    "[name]; MWF" (07/04/2017)  . GERD (gastroesophageal reflux disease)    DATE UNKNOWN  . Hemorrhoids   . Hepatitis B, chronic ([place])   . Hepatitis C   . History of kidney stones   . Hyperkalemia   . Hypertension   . Kidney failure   . Metabolic bone disease    Patient denies  . Mitral stenosis   . Myocardial infarction ([place])   . Pneumonia   . Pulmonary edema   . Solitary rectal ulcer syndrome 07/2017   at flex sig for rectal bleeding  . Tubular adenoma of colon     Patient Active Problem List   Diagnosis Date Noted  . Dyspnea 06/09/2019  . Acquired thrombophilia  ([place]) 06/05/2019  . A-fib ([place]) 05/30/2019  . Atrial fibrillation with RVR ([place]) 05/29/2019  . Melena   . Pressure injury of skin 03/09/2019  . Abdominal distention   . Volume overload 12/28/2018  . Sepsis ([place]) 09/12/2018  . Atherosclerosis of native coronary artery of native heart without angina pectoris 03/11/2018  . Benign neoplasm of cecum   . Benign neoplasm of ascending colon   . Benign neoplasm of descending colon   . Benign neoplasm of rectum   . Paroxysmal atrial fibrillation ([place]) 01/23/2018  . Hx of colonic polyps 01/20/2018  . ESRD (end stage renal disease) ([place]) 11/21/2017  . GERD (gastroesophageal reflux disease) 11/16/2017  . Liver cirrhosis ([place]) 11/15/2017  . DNR (do not resuscitate)   . Palliative care by specialist   . Hyponatremia 11/04/2017  . SBP (spontaneous bacterial peritonitis) ([place]) 10/30/2017  . Liver disease, chronic 10/30/2017  . SOB (shortness of breath)   . Abdominal pain 10/28/2017  . Upper airway cough syndrome with flattening on f/v loop 10/13/17 c/w vcd 10/17/2017  . Elevated diaphragm 10/13/2017  . Ileus ([place]) 09/29/2017  . QT prolongation 09/29/2017  . Malnutrition of moderate degree 09/29/2017  . Sinus congestion 09/03/2017  . Symptomatic anemia 09/02/2017  . Other cirrhosis of liver ([place]) 09/02/2017  . Left bundle branch block 09/02/2017  .  Mitral stenosis 09/02/2017  . Hematochezia 07/15/2017  . Wide-complex tachycardia ([place])   . Endotracheally intubated   . ESRD on dialysis ([place]) 07/04/2017  . Acute respiratory failure with hypoxia ([place]) 06/18/2017  . CKD (chronic kidney disease) stage V requiring chronic dialysis ([place]) 06/18/2017  . History of Cocaine abuse ([place]) 06/18/2017  . Hypertension 06/18/2017  . Infection of AV graft for dialysis ([place]) 06/18/2017  . Anxiety 06/18/2017  . Anemia due to chronic kidney disease 06/18/2017  . Atypical atrial flutter ([place]) 06/18/2017  . Personality disorder ([place]) 06/13/2017  . Cellulitis  06/12/2017  . Adjustment disorder with mixed anxiety and depressed mood 06/10/2017  . Suicidal ideation 06/10/2017  . Arm wound, left, sequela 06/10/2017  . Dyspnea on exertion 05/29/2017  . Tachycardia 05/29/2017  . Hyperkalemia 05/22/2017  . Acute metabolic encephalopathy   . Anemia 04/23/2017  . Ascites 04/23/2017  . COPD (chronic obstructive pulmonary disease) ([place]) 04/23/2017  . Acute on chronic respiratory failure with hypoxia ([place]) 03/25/2017  . Arrhythmia 03/25/2017  . COPD GOLD 0 with flattening on inps f/v  09/27/2016  . Essential hypertension 09/27/2016  . Fluid overload 08/30/2016  . COPD exacerbation ([place]) 08/17/2016  . Hypertensive urgency 08/17/2016  . Respiratory failure ([place]) 08/17/2016  . Problem with dialysis access ([place]) 07/23/2016  . Chronic hepatitis B ([place]) 03/05/2014  . Chronic hepatitis C without hepatic coma ([place]) 03/05/2014  . Internal hemorrhoids with bleeding, swelling and itching 03/05/2014  . Thrombocytopenia ([place]) 03/05/2014  . Chest pain 02/27/2014  . Alcohol abuse 04/14/2009  . Cigarette smoker 04/14/2009  . GANGLION CYST 04/14/2009    Past Surgical History:  Procedure Laterality Date  . A/V FISTULAGRAM Left 05/26/2017   Procedure: A/V FISTULAGRAM;  Surgeon: Conrad Holmes Beach, MD;  Location: [place] [facility];  Service: Cardiovascular;  Laterality: Left;  . A/V FISTULAGRAM Right 11/18/2017   Procedure: A/V FISTULAGRAM - Right Arm;  Surgeon: Elam Dutch, MD;  Location: [place] [facility];  Service: Cardiovascular;  Laterality: Right;  . APPLICATION OF WOUND VAC Left 06/14/2017   Procedure: APPLICATION OF WOUND VAC;  Surgeon: Katha Cabal, MD;  Location: [facility];  Service: Vascular;  Laterality: Left;  . AV FISTULA PLACEMENT  2012   BELIEVED WAS PLACED IN JUNE  . AV FISTULA PLACEMENT Right 08/09/2017   Procedure: Creation Right arm ARTERIOVENOUS BRACHIOCEPOHALIC FISTULA;  Surgeon: Elam Dutch, MD;  Location: [facility] OR;  Service:  Vascular;  Laterality: Right;  . AV FISTULA PLACEMENT Right 11/22/2017   Procedure: INSERTION OF ARTERIOVENOUS (AV) GORE-TEX GRAFT RIGHT UPPER ARM;  Surgeon: Elam Dutch, MD;  Location: [place];  Service: Vascular;  Laterality: Right;  . BIOPSY  01/25/2018   Procedure: BIOPSY;  Surgeon: Jerene Bears, MD;  Location: [place];  Service: Gastroenterology;;  . BIOPSY  04/10/2019   Procedure: BIOPSY;  Surgeon: Jerene Bears, MD;  Location: [facility];  Service: Gastroenterology;;  . COLONOSCOPY    . COLONOSCOPY WITH PROPOFOL N/A 01/25/2018   Procedure: COLONOSCOPY WITH PROPOFOL;  Surgeon: Jerene Bears, MD;  Location: [place];  Service: Gastroenterology;  Laterality: N/A;  . CORONARY STENT INTERVENTION N/A 02/22/2017   Procedure: CORONARY STENT INTERVENTION;  Surgeon: Nigel Mormon, MD;  Location: [place] [facility];  Service: Cardiovascular;  Laterality: N/A;  . ESOPHAGOGASTRODUODENOSCOPY (EGD) WITH PROPOFOL N/A 01/25/2018   Procedure: ESOPHAGOGASTRODUODENOSCOPY (EGD) WITH PROPOFOL;  Surgeon: Jerene Bears, MD;  Location: [place];  Service: Gastroenterology;  Laterality: N/A;  .  ESOPHAGOGASTRODUODENOSCOPY (EGD) WITH PROPOFOL N/A 04/10/2019   Procedure: ESOPHAGOGASTRODUODENOSCOPY (EGD) WITH PROPOFOL;  Surgeon: Jerene Bears, MD;  Location: [facility];  Service: Gastroenterology;  Laterality: N/A;  . FLEXIBLE SIGMOIDOSCOPY N/A 07/15/2017   Procedure: FLEXIBLE SIGMOIDOSCOPY;  Surgeon: Carol Ada, MD;  Location: [place];  Service: Endoscopy;  Laterality: N/A;  . HEMORRHOID BANDING    . I & D EXTREMITY Left 06/01/2017   Procedure: IRRIGATION AND DEBRIDEMENT LEFT ARM HEMATOMA WITH LIGATION OF LEFT ARM AV FISTULA;  Surgeon: Elam Dutch, MD;  Location: [place];  Service: Vascular;  Laterality: Left;  . I & D EXTREMITY Left 06/14/2017   Procedure: IRRIGATION AND DEBRIDEMENT EXTREMITY;  Surgeon: Katha Cabal, MD;  Location: [facility];  Service: Vascular;  Laterality:  Left;  . INSERTION OF DIALYSIS CATHETER  05/30/2017  . INSERTION OF DIALYSIS CATHETER N/A 05/30/2017   Procedure: INSERTION OF DIALYSIS CATHETER;  Surgeon: Elam Dutch, MD;  Location: [place];  Service: Vascular;  Laterality: N/A;  . IR PARACENTESIS  08/30/2017  . IR PARACENTESIS  09/29/2017  . IR PARACENTESIS  10/28/2017  . IR PARACENTESIS  11/09/2017  . IR PARACENTESIS  11/16/2017  . IR PARACENTESIS  11/28/2017  . IR PARACENTESIS  12/01/2017  . IR PARACENTESIS  12/06/2017  . IR PARACENTESIS  01/03/2018  . IR PARACENTESIS  01/23/2018  . IR PARACENTESIS  02/07/2018  . IR PARACENTESIS  02/21/2018  . IR PARACENTESIS  03/06/2018  . IR PARACENTESIS  03/17/2018  . IR PARACENTESIS  04/04/2018  . IR PARACENTESIS  12/28/2018  . IR PARACENTESIS  01/08/2019  . IR PARACENTESIS  01/23/2019  . IR PARACENTESIS  02/01/2019  . IR PARACENTESIS  02/19/2019  . IR PARACENTESIS  03/01/2019  . IR PARACENTESIS  03/15/2019  . IR PARACENTESIS  04/03/2019  . IR PARACENTESIS  04/12/2019  . IR PARACENTESIS  05/01/2019  . IR PARACENTESIS  05/08/2019  . IR PARACENTESIS  05/24/2019  . IR PARACENTESIS  06/12/2019  . IR PARACENTESIS  07/09/2019  . IR RADIOLOGIST EVAL & MGMT  02/14/2018  . IR RADIOLOGIST EVAL & MGMT  02/22/2019  . LEFT HEART CATH AND CORONARY ANGIOGRAPHY N/A 02/22/2017   Procedure: LEFT HEART CATH AND CORONARY ANGIOGRAPHY;  Surgeon: Nigel Mormon, MD;  Location: [place] [facility];  Service: Cardiovascular;  Laterality: N/A;  . LIGATION OF ARTERIOVENOUS  FISTULA Left 0/09/5463   Procedure: Plication of Left Arm Arteriovenous Fistula;  Surgeon: Elam Dutch, MD;  Location: [place];  Service: Vascular;  Laterality: Left;  . POLYPECTOMY    . POLYPECTOMY  01/25/2018   Procedure: POLYPECTOMY;  Surgeon: Jerene Bears, MD;  Location: [place];  Service: Gastroenterology;;  . REVISON OF ARTERIOVENOUS FISTULA Left 6/81/2751   Procedure: PLICATION OF DISTAL ANEURYSMAL SEGEMENT OF LEFT UPPER ARM ARTERIOVENOUS FISTULA;   Surgeon: Elam Dutch, MD;  Location: [place];  Service: Vascular;  Laterality: Left;  . REVISON OF ARTERIOVENOUS FISTULA Left 7/00/1749   Procedure: Plication of Left Upper Arm Fistula ;  Surgeon: Waynetta Sandy, MD;  Location: [place];  Service: Vascular;  Laterality: Left;  . SKIN GRAFT SPLIT THICKNESS LEG / FOOT Left    SKIN GRAFT SPLIT THICKNESS LEFT ARM DONOR SITE: LEFT ANTERIOR THIGH  . SKIN SPLIT GRAFT Left 07/04/2017   Procedure: SKIN GRAFT SPLIT THICKNESS LEFT ARM DONOR SITE: LEFT ANTERIOR THIGH;  Surgeon: Elam Dutch, MD;  Location: [place];  Service: Vascular;  Laterality: Left;  . THROMBECTOMY W/  EMBOLECTOMY Left 06/05/2017   Procedure: EXPLORATION OF LEFT ARM FOR BLEEDING; OVERSEWED PROXIMAL FISTULA;  Surgeon: Angelia Mould, MD;  Location: [place];  Service: Vascular;  Laterality: Left;  . WOUND EXPLORATION Left 06/03/2017   Procedure: WOUND EXPLORATION WITH WOUND VAC APPLICATION TO LEFT ARM;  Surgeon: Angelia Mould, MD;  Location: [facility] OR;  Service: Vascular;  Laterality: Left;       Family History  Problem Relation Age of Onset  . Heart disease Mother   . Lung cancer Mother   . Heart disease Father   . Malignant hyperthermia Father   . COPD Father   . Throat cancer Sister   . Esophageal cancer Sister   . Hypertension Other   . COPD Other   . Colon cancer Neg Hx   . Colon polyps Neg Hx   . Rectal cancer Neg Hx   . Stomach cancer Neg Hx     Social History   Tobacco Use  . Smoking status: Current Every Day Smoker    Packs/day: 0.50    Years: 43.00    Pack years: 21.50    Types: Cigarettes    Start date: 08/13/1973  . Smokeless tobacco: Never Used  . Tobacco comment: i dont know i just make   Substance Use Topics  . Alcohol use: Not Currently    Comment: quit drinking in 2017  . Drug use: Not Currently    Types: Marijuana, Cocaine    Comment: reports using once every 3 months, 04-06-2019 was this     Home Medications Prior to  Admission medications   Medication Sig Start Date End Date Taking? Authorizing Provider  albuterol (VENTOLIN HFA) 108 (90 Base) MCG/ACT inhaler Inhale 2 puffs into the lungs every 6 (six) hours as needed for wheezing or shortness of breath.    [provider]  amiodarone (PACERONE) 200 MG tablet Take 1 tablet (200 mg total) by mouth daily. 07/22/19 08/21/19  Lucrezia Starch, MD  hydrALAZINE (APRESOLINE) 100 MG tablet Take 0.5 tablets (50 mg total) by mouth 3 (three) times daily. 05/31/19   [place], Carley D, DO  lactulose (CHRONULAC) 10 GM/15ML solution TAKE 45 MLS BY MOUTH 2 TIMES DAILY. Patient taking differently: Take 30 g by mouth 2 (two) times daily.  06/04/19   Al Decant, MD  loperamide (IMODIUM A-D) 2 MG tablet Take 2 mg by mouth 4 (four) times daily as needed for diarrhea or loose stools.    [provider]  metoprolol tartrate (LOPRESSOR) 100 MG tablet Take 1 tablet (100 mg total) by mouth 2 (two) times daily. 06/04/19   Al Decant, MD  montelukast (SINGULAIR) 10 MG tablet Take 10 mg by mouth every evening.     [provider]  ondansetron (ZOFRAN) 8 MG tablet Take 8 mg by mouth every 4 (four) hours as needed. 06/14/19   [provider]  pantoprazole (PROTONIX) 40 MG tablet Take 1 tablet (40 mg total) by mouth daily. Patient taking differently: Take 40 mg by mouth See admin instructions. Take 40 mg by mouth in the morning before breakfast and an additional 40 mg once a day, if symptoms persist 06/05/19   Maudie Mercury, MD  sevelamer carbonate (RENVELA) 800 MG tablet Take 4 tablets with meals  And 2 tablets twice daily with snacks Patient taking differently: Take 1,600-2,400 mg by mouth See admin instructions. Take 2,400 mg by mouth three times a day with meals and 1,600 mg with each snack 01/22/18   Vance Gather  B, MD  sulfamethoxazole-trimethoprim (BACTRIM DS) 800-160 MG tablet Take 1 tablet by mouth daily. Patient taking differently: Take 1  tablet by mouth 2 (two) times daily.  12/30/18   Geradine Girt, DO  SYMBICORT 160-4.5 MCG/ACT inhaler Inhale 2 puffs into the lungs 2 (two) times daily.  11/30/18   [provider]  zolpidem (AMBIEN) 10 MG tablet Take 10 mg by mouth at bedtime as needed for sleep.    [provider]    Allergies    Morphine and related, Aspirin, Clonidine derivatives, Tramadol, and Tylenol [acetaminophen]  Review of Systems   Review of Systems  Constitutional: Negative for chills and fever.  HENT: Negative for ear pain and sore throat.   Eyes: Negative for pain and visual disturbance.  Respiratory: Positive for shortness of breath. Negative for cough.   Cardiovascular: Positive for palpitations. Negative for chest pain.  Gastrointestinal: Negative for abdominal pain and vomiting.  Genitourinary: Negative for dysuria and hematuria.  Musculoskeletal: Negative for arthralgias and back pain.  Skin: Negative for color change and rash.  Neurological: Negative for seizures and syncope.  All other systems reviewed and are negative.   Physical Exam Updated Vital Signs BP (!) 173/67   Pulse (!) 43   Temp 97.9 F (36.6 C) (Oral)   Resp 17   Wt 65.8 kg   SpO2 99%   BMI 21.41 kg/m   Physical Exam Vitals and nursing note reviewed.  Constitutional:      Appearance: He is well-developed.  HENT:     Head: Normocephalic and atraumatic.  Eyes:     Conjunctiva/sclera: Conjunctivae normal.  Cardiovascular:     Rate and Rhythm: Normal rate and regular rhythm.     Heart sounds: No murmur.  Pulmonary:     Effort: Pulmonary effort is normal. No respiratory distress.     Breath sounds: Normal breath sounds.  Abdominal:     Palpations: Abdomen is soft.     Tenderness: There is no abdominal tenderness.     Comments: distended but no focal tenderness  Musculoskeletal:     Cervical back: Neck supple.  Skin:    General: Skin is warm and dry.  Neurological:     General: No focal deficit  present.     Mental Status: He is alert and oriented to person, place, and time.  Psychiatric:        Mood and Affect: Mood normal.        Behavior: Behavior normal.     ED Results / Procedures / Treatments   Labs (all labs ordered are listed, but only abnormal results are displayed) Labs Reviewed  CBC WITH DIFFERENTIAL/PLATELET - Abnormal; Notable for the following components:      Result Value   RBC 3.65 (*)    Hemoglobin 10.1 (*)    HCT 29.0 (*)    MCV 79.5 (*)    All other components within normal limits  BASIC METABOLIC PANEL - Abnormal; Notable for the following components:   Sodium 133 (*)    Chloride 95 (*)    Glucose, Bld 188 (*)    BUN 33 (*)    Creatinine, Ser 8.59 (*)    GFR calc non Af Amer 6 (*)    GFR calc Af Amer 7 (*)    All other components within normal limits  I-STAT CHEM 8, ED - Abnormal; Notable for the following components:   Sodium 132 (*)    BUN 32 (*)    Creatinine, Ser  9.10 (*)    Glucose, Bld 183 (*)    Calcium, Ion 1.08 (*)    Hemoglobin 10.9 (*)    HCT 32.0 (*)    All other components within normal limits  TROPONIN I (HIGH SENSITIVITY) - Abnormal; Notable for the following components:   Troponin I (High Sensitivity) 114 (*)    All other components within normal limits  TROPONIN I (HIGH SENSITIVITY) - Abnormal; Notable for the following components:   Troponin I (High Sensitivity) 115 (*)    All other components within normal limits  MAGNESIUM    EKG EKG Interpretation  Date/Time:  Sunday July 22 2019 09:29:20 EST Ventricular Rate:  67 PR Interval:    QRS Duration: 177 QT Interval:  486 QTC Calculation: 514 R Axis:   -146 Text Interpretation: Possible atrial arrhythmia Atrial premature complexes in couplets Second deg AVB, Mobitz I (Wenckebach) Consider left ventricular hypertrophy Borderline T abnormalities, lateral leads Prolonged QT interval Confirmed by ,  ([id number]) on 07/22/2019 9:40:15 AM   Radiology DG Chest  Portable 1 View  Result Date: 07/22/2019 CLINICAL DATA:  Chest pain EXAM: PORTABLE CHEST 1 VIEW COMPARISON:  July 17, 2019 FINDINGS: Lungs clear. There is cardiomegaly with pulmonary vascularity normal. No adenopathy. There is aortic atherosclerosis. No bone lesions. There is calcification in both carotid arteries as well as in the right subclavian artery. IMPRESSION: Stable cardiomegaly. Lungs clear. Aortic Atherosclerosis (ICD10-I70.0). Electronically Signed   By: William  Woodruff III M.D.   On: 07/22/2019 09:59    Procedures Procedures (including critical care time)  Medications Ordered in ED Medications  fentaNYL (SUBLIMAZE) 100 MCG/2ML injection (has no administration in time range)  fentaNYL (SUBLIMAZE) injection 50 mcg (50 mcg Intravenous Given 07/22/19 1000)  calcium gluconate 1 g/ 50 mL sodium chloride IVPB (0 g Intravenous Stopped 07/22/19 1019)  insulin aspart (novoLOG) injection 5 Units (5 Units Intravenous Given 07/22/19 0948)    And  dextrose 50 % solution 50 mL (50 mLs Intravenous Given 07/22/19 0942)  albuterol (VENTOLIN HFA) 108 (90 Base) MCG/ACT inhaler 2 puff (2 puffs Inhalation Given 07/22/19 0946)  amiodarone (PACERONE) tablet 400 mg (400 mg Oral Given 07/22/19 1300)  LORazepam (ATIVAN) tablet 1 mg (1 mg Oral Given 07/22/19 1300)    ED Course  I have reviewed the triage vital signs and the nursing notes.  Pertinent labs & imaging results that were available during my care of the patient were reviewed by me and considered in my medical decision making (see chart for details).  Clinical Course as of Jul 21 1602  Sun Jul 22, 2019  0931 On arrival, patient now in narrow complex rhythm, HR 60s; concern for possible hyperkalemia leading to arrythmia, will give insulin/D50, calcium, albuterol   [RD]  0953 Reviewed initial labs   [RD]    Clinical Course User Index [RD] ,  S, MD   MDM Rules/Calculators/A&P                      58  year old male ESRD on  dialysis, A. fib not on anticoagulation presents to ER after episode of palpitations, shortness of breath.  EMS found patient to have wide-complex tachycardia, suspected V. tach and was given dose of amiodarone.  Here patient's heart rate was much more well controlled.  Treated empirically for possible hyperkalemia given dialysis patient reported tachycardia with insulin, calcium, albuterol.  No further episodes of the tachycardia.  On further review of the rhythm strip, suspect patient had atrial fibrillation with  aberrancy.  His potassium was actually normal.  I discussed case with his cardiologist, Dr. Einar Gip.  Recommended starting patient on amiodarone 200 mg daily.  Mild troponin elevation likely rate related, stable, not trending up, also dialysis, appears baseline in comparison to other troponins recently.  Per chart review, not on AC due to prior hx GI bleed, medication noncompliance. Recommended close follow-up in their clinic this coming week.  Vital signs remained stable while in ER and patient remained well-appearing with no ongoing symptoms.  Reviewed return precautions, will dc home.    After the discussed management above, the patient was determined to be safe for discharge.  The patient was in agreement with this plan and all questions regarding their care were answered.  ED return precautions were discussed and the patient will return to the ED with any significant worsening of condition.    Final Clinical Impression(s) / ED Diagnoses Final diagnoses:  Atrial fibrillation, unspecified type [facility])    Rx / DC Orders ED Discharge Orders         Ordered    amiodarone (PACERONE) 200 MG tablet  Daily     07/22/19 1210           Lucrezia Starch, MD 07/22/19 1605

## 2019-07-22 NOTE — Discharge Instructions (Signed)
Take the medication as prescribed.  Return to ER for any recurrent shortness of breath, chest pain, palpitations.  Please go to dialysis as previously scheduled.  Please call cardiologist and your primary care doctor to schedule follow-up appointment regarding episode today.

## 2019-07-23 ENCOUNTER — Other Ambulatory Visit: Payer: Self-pay

## 2019-07-23 ENCOUNTER — Emergency Department (HOSPITAL_COMMUNITY)
Admission: EM | Admit: 2019-07-23 | Discharge: 2019-07-23 | Disposition: A | Discharge status: Home / Self Care | Payer: Medicare Other | Attending: Emergency Medicine | Admitting: Emergency Medicine

## 2019-07-23 ENCOUNTER — Encounter (HOSPITAL_COMMUNITY): Payer: Self-pay | Admitting: Emergency Medicine

## 2019-07-23 ENCOUNTER — Emergency Department (HOSPITAL_COMMUNITY): Payer: Medicare Other

## 2019-07-23 DIAGNOSIS — Z955 Presence of coronary angioplasty implant and graft: Secondary | ICD-10-CM | POA: Insufficient documentation

## 2019-07-23 DIAGNOSIS — Z992 Dependence on renal dialysis: Secondary | ICD-10-CM | POA: Insufficient documentation

## 2019-07-23 DIAGNOSIS — Z79899 Other long term (current) drug therapy: Secondary | ICD-10-CM | POA: Diagnosis not present

## 2019-07-23 DIAGNOSIS — R0602 Shortness of breath: Secondary | ICD-10-CM | POA: Diagnosis present

## 2019-07-23 DIAGNOSIS — I120 Hypertensive chronic kidney disease with stage 5 chronic kidney disease or end stage renal disease: Secondary | ICD-10-CM | POA: Insufficient documentation

## 2019-07-23 DIAGNOSIS — J449 Chronic obstructive pulmonary disease, unspecified: Secondary | ICD-10-CM | POA: Insufficient documentation

## 2019-07-23 DIAGNOSIS — N186 End stage renal disease: Secondary | ICD-10-CM | POA: Diagnosis not present

## 2019-07-23 DIAGNOSIS — I252 Old myocardial infarction: Secondary | ICD-10-CM | POA: Insufficient documentation

## 2019-07-23 DIAGNOSIS — I2510 Atherosclerotic heart disease of native coronary artery without angina pectoris: Secondary | ICD-10-CM | POA: Insufficient documentation

## 2019-07-23 DIAGNOSIS — F17210 Nicotine dependence, cigarettes, uncomplicated: Secondary | ICD-10-CM | POA: Insufficient documentation

## 2019-07-23 LAB — COMPREHENSIVE METABOLIC PANEL
ALT: 19 U/L (ref 0–44)
AST: 39 U/L (ref 15–41)
Albumin: 3.8 g/dL (ref 3.5–5.0)
Alkaline Phosphatase: 81 U/L (ref 38–126)
Anion gap: 17 — ABNORMAL HIGH (ref 5–15)
BUN: 40 mg/dL — ABNORMAL HIGH (ref 6–20)
CO2: 22 mmol/L (ref 22–32)
Calcium: 10 mg/dL (ref 8.9–10.3)
Chloride: 97 mmol/L — ABNORMAL LOW (ref 98–111)
Creatinine, Ser: 9.95 mg/dL — ABNORMAL HIGH (ref 0.61–1.24)
GFR calc Af Amer: 6 mL/min — ABNORMAL LOW (ref >60)
GFR calc non Af Amer: 5 mL/min — ABNORMAL LOW (ref >60)
Glucose, Bld: 113 mg/dL — ABNORMAL HIGH (ref 70–99)
Potassium: 6 mmol/L — ABNORMAL HIGH (ref 3.5–5.1)
Sodium: 136 mmol/L (ref 135–145)
Total Bilirubin: 0.8 mg/dL (ref 0.3–1.2)
Total Protein: 6.8 g/dL (ref 6.5–8.1)

## 2019-07-23 LAB — CBC WITH DIFFERENTIAL/PLATELET
Abs Immature Granulocytes: 0.03 10*3/uL (ref 0.00–0.07)
Basophils Absolute: 0.1 10*3/uL (ref 0.0–0.1)
Basophils Relative: 1 %
Eosinophils Absolute: 0.1 10*3/uL (ref 0.0–0.5)
Eosinophils Relative: 1 %
HCT: 28.2 % — ABNORMAL LOW (ref 39.0–52.0)
Hemoglobin: 9.7 g/dL — ABNORMAL LOW (ref 13.0–17.0)
Immature Granulocytes: 0 %
Lymphocytes Relative: 18 %
Lymphs Abs: 1.4 10*3/uL (ref 0.7–4.0)
MCH: 27.2 pg (ref 26.0–34.0)
MCHC: 34.4 g/dL (ref 30.0–36.0)
MCV: 79.2 fL — ABNORMAL LOW (ref 80.0–100.0)
Monocytes Absolute: 0.7 10*3/uL (ref 0.1–1.0)
Monocytes Relative: 8 %
Neutro Abs: 5.7 10*3/uL (ref 1.7–7.7)
Neutrophils Relative %: 72 %
Platelets: 129 10*3/uL — ABNORMAL LOW (ref 150–400)
RBC: 3.56 MIL/uL — ABNORMAL LOW (ref 4.22–5.81)
RDW: 15.5 % (ref 11.5–15.5)
WBC: 8 10*3/uL (ref 4.0–10.5)
nRBC: 0 % (ref 0.0–0.2)

## 2019-07-23 NOTE — Discharge Instructions (Addendum)
Go to dialysis today as scheduled at 0830. Follow-up with Dr. Einar Gip about your AFIB, continue amiodarone from yesterday. Can scheduled outpatient paracentesis if you feel this is needed. Return here for any new/acute changes.

## 2019-07-23 NOTE — ED Provider Notes (Signed)
[facility] EMERGENCY DEPARTMENT Provider Note   CSN: [id number] Arrival date & time: 07/23/19  0436     History Chief Complaint  Patient presents with  . Shortness of Breath    [name] is a 59 y.o. male.  The history is provided by the patient and medical records.  Shortness of Breath   59 y.o. M with hx of anemia, anxiety, cocaine abuse, COPD, CAD, ESRD on HD (MWF), Hep B&C, prior MI, presenting to the ED for SOB.  EMS called this AM as patient woke up and was SOB.  He was initially 89% on RA which improved with NRB.  Initial rhythm of AFIB with rate about 160's, however patient converted spontaneously and now NSR with rate in the 80's.  States he is feeling better now, no further palpitations or SOB.  He states he feels like his stomach is tight.  He gets occasional paracentesis whenever needed-- last one was 2 weeks ago, took off 2+ liters.  He is due for dialysis this AM, had full treatment on Friday.  Denies cough, fever, vomiting, diarrhea, etc.  He is not currently on anticoagulation due to history of GI bleeding.  Of note, patient seen in the ED yesterday for similar.  Potassium at that time was normal.  Discussed with cardiology regarding his AFIB, recommended to start amiodarone and close office follow-up.  Past Medical History:  Diagnosis Date  . Anemia   . Anxiety   . Arthritis    left shoulder  . Atherosclerosis of aorta ([place])   . Cardiomegaly   . Chest pain    DATE UNKNOWN, C/O PERIODICALLY  . Cocaine abuse ([place])   . COPD exacerbation ([place]) 08/17/2016  . Coronary artery disease    stent 02/22/17  . ESRD (end stage renal disease) on dialysis ([place])    "[name]; MWF" (07/04/2017)  . GERD (gastroesophageal reflux disease)    DATE UNKNOWN  . Hemorrhoids   . Hepatitis B, chronic ([place])   . Hepatitis C   . History of kidney stones   . Hyperkalemia   . Hypertension   . Kidney failure   . Metabolic bone disease    Patient denies   . Mitral stenosis   . Myocardial infarction ([place])   . Pneumonia   . Pulmonary edema   . Solitary rectal ulcer syndrome 07/2017   at flex sig for rectal bleeding  . Tubular adenoma of colon     Patient Active Problem List   Diagnosis Date Noted  . Dyspnea 06/09/2019  . Acquired thrombophilia ([place]) 06/05/2019  . A-fib ([place]) 05/30/2019  . Atrial fibrillation with RVR ([place]) 05/29/2019  . Melena   . Pressure injury of skin 03/09/2019  . Abdominal distention   . Volume overload 12/28/2018  . Sepsis ([place]) 09/12/2018  . Atherosclerosis of native coronary artery of native heart without angina pectoris 03/11/2018  . Benign neoplasm of cecum   . Benign neoplasm of ascending colon   . Benign neoplasm of descending colon   . Benign neoplasm of rectum   . Paroxysmal atrial fibrillation ([place]) 01/23/2018  . Hx of colonic polyps 01/20/2018  . ESRD (end stage renal disease) ([place]) 11/21/2017  . GERD (gastroesophageal reflux disease) 11/16/2017  . Liver cirrhosis ([place]) 11/15/2017  . DNR (do not resuscitate)   . Palliative care by specialist   . Hyponatremia 11/04/2017  . SBP (spontaneous bacterial peritonitis) ([place]) 10/30/2017  . Liver disease, chronic 10/30/2017  . SOB (shortness of  breath)   . Abdominal pain 10/28/2017  . Upper airway cough syndrome with flattening on f/v loop 10/13/17 c/w vcd 10/17/2017  . Elevated diaphragm 10/13/2017  . Ileus ([place]) 09/29/2017  . QT prolongation 09/29/2017  . Malnutrition of moderate degree 09/29/2017  . Sinus congestion 09/03/2017  . Symptomatic anemia 09/02/2017  . Other cirrhosis of liver ([place]) 09/02/2017  . Left bundle branch block 09/02/2017  . Mitral stenosis 09/02/2017  . Hematochezia 07/15/2017  . Wide-complex tachycardia ([place])   . Endotracheally intubated   . ESRD on dialysis ([place]) 07/04/2017  . Acute respiratory failure with hypoxia ([place]) 06/18/2017  . CKD (chronic kidney disease) stage V requiring chronic dialysis ([place]) 06/18/2017  .  History of Cocaine abuse ([place]) 06/18/2017  . Hypertension 06/18/2017  . Infection of AV graft for dialysis ([place]) 06/18/2017  . Anxiety 06/18/2017  . Anemia due to chronic kidney disease 06/18/2017  . Atypical atrial flutter ([place]) 06/18/2017  . Personality disorder ([place]) 06/13/2017  . Cellulitis 06/12/2017  . Adjustment disorder with mixed anxiety and depressed mood 06/10/2017  . Suicidal ideation 06/10/2017  . Arm wound, left, sequela 06/10/2017  . Dyspnea on exertion 05/29/2017  . Tachycardia 05/29/2017  . Hyperkalemia 05/22/2017  . Acute metabolic encephalopathy   . Anemia 04/23/2017  . Ascites 04/23/2017  . COPD (chronic obstructive pulmonary disease) ([place]) 04/23/2017  . Acute on chronic respiratory failure with hypoxia ([place]) 03/25/2017  . Arrhythmia 03/25/2017  . COPD GOLD 0 with flattening on inps f/v  09/27/2016  . Essential hypertension 09/27/2016  . Fluid overload 08/30/2016  . COPD exacerbation ([place]) 08/17/2016  . Hypertensive urgency 08/17/2016  . Respiratory failure ([place]) 08/17/2016  . Problem with dialysis access ([place]) 07/23/2016  . Chronic hepatitis B ([place]) 03/05/2014  . Chronic hepatitis C without hepatic coma ([place]) 03/05/2014  . Internal hemorrhoids with bleeding, swelling and itching 03/05/2014  . Thrombocytopenia ([place]) 03/05/2014  . Chest pain 02/27/2014  . Alcohol abuse 04/14/2009  . Cigarette smoker 04/14/2009  . GANGLION CYST 04/14/2009    Past Surgical History:  Procedure Laterality Date  . A/V FISTULAGRAM Left 05/26/2017   Procedure: A/V FISTULAGRAM;  Surgeon: Conrad Rockford, MD;  Location: [facility];  Service: Cardiovascular;  Laterality: Left;  . A/V FISTULAGRAM Right 11/18/2017   Procedure: A/V FISTULAGRAM - Right Arm;  Surgeon: Elam Dutch, MD;  Location: [facility];  Service: Cardiovascular;  Laterality: Right;  . APPLICATION OF WOUND VAC Left 06/14/2017   Procedure: APPLICATION OF WOUND VAC;  Surgeon: Katha Cabal, MD;   Location: [facility];  Service: Vascular;  Laterality: Left;  . AV FISTULA PLACEMENT  2012   BELIEVED WAS PLACED IN JUNE  . AV FISTULA PLACEMENT Right 08/09/2017   Procedure: Creation Right arm ARTERIOVENOUS BRACHIOCEPOHALIC FISTULA;  Surgeon: Elam Dutch, MD;  Location: [facility];  Service: Vascular;  Laterality: Right;  . AV FISTULA PLACEMENT Right 11/22/2017   Procedure: INSERTION OF ARTERIOVENOUS (AV) GORE-TEX GRAFT RIGHT UPPER ARM;  Surgeon: Elam Dutch, MD;  Location: [facility];  Service: Vascular;  Laterality: Right;  . BIOPSY  01/25/2018   Procedure: BIOPSY;  Surgeon: Jerene Bears, MD;  Location: [facility];  Service: Gastroenterology;;  . BIOPSY  04/10/2019   Procedure: BIOPSY;  Surgeon: Jerene Bears, MD;  Location: [facility];  Service: Gastroenterology;;  . COLONOSCOPY    . COLONOSCOPY WITH PROPOFOL N/A 01/25/2018   Procedure: COLONOSCOPY WITH PROPOFOL;  Surgeon: Jerene Bears, MD;  Location: [facility]  ENDOSCOPY;  Service: Gastroenterology;  Laterality: N/A;  . CORONARY STENT INTERVENTION N/A 02/22/2017   Procedure: CORONARY STENT INTERVENTION;  Surgeon: Nigel Mormon, MD;  Location: [facility];  Service: Cardiovascular;  Laterality: N/A;  . ESOPHAGOGASTRODUODENOSCOPY (EGD) WITH PROPOFOL N/A 01/25/2018   Procedure: ESOPHAGOGASTRODUODENOSCOPY (EGD) WITH PROPOFOL;  Surgeon: Jerene Bears, MD;  Location: [facility];  Service: Gastroenterology;  Laterality: N/A;  . ESOPHAGOGASTRODUODENOSCOPY (EGD) WITH PROPOFOL N/A 04/10/2019   Procedure: ESOPHAGOGASTRODUODENOSCOPY (EGD) WITH PROPOFOL;  Surgeon: Jerene Bears, MD;  Location: [facility];  Service: Gastroenterology;  Laterality: N/A;  . FLEXIBLE SIGMOIDOSCOPY N/A 07/15/2017   Procedure: FLEXIBLE SIGMOIDOSCOPY;  Surgeon: Carol Ada, MD;  Location: [place];  Service: Endoscopy;  Laterality: N/A;  . HEMORRHOID BANDING    . I & D EXTREMITY Left 06/01/2017   Procedure: IRRIGATION AND DEBRIDEMENT LEFT ARM HEMATOMA WITH  LIGATION OF LEFT ARM AV FISTULA;  Surgeon: Elam Dutch, MD;  Location: [facility];  Service: Vascular;  Laterality: Left;  . I & D EXTREMITY Left 06/14/2017   Procedure: IRRIGATION AND DEBRIDEMENT EXTREMITY;  Surgeon: Katha Cabal, MD;  Location: [facility];  Service: Vascular;  Laterality: Left;  . INSERTION OF DIALYSIS CATHETER  05/30/2017  . INSERTION OF DIALYSIS CATHETER N/A 05/30/2017   Procedure: INSERTION OF DIALYSIS CATHETER;  Surgeon: Elam Dutch, MD;  Location: [facility];  Service: Vascular;  Laterality: N/A;  . IR PARACENTESIS  08/30/2017  . IR PARACENTESIS  09/29/2017  . IR PARACENTESIS  10/28/2017  . IR PARACENTESIS  11/09/2017  . IR PARACENTESIS  11/16/2017  . IR PARACENTESIS  11/28/2017  . IR PARACENTESIS  12/01/2017  . IR PARACENTESIS  12/06/2017  . IR PARACENTESIS  01/03/2018  . IR PARACENTESIS  01/23/2018  . IR PARACENTESIS  02/07/2018  . IR PARACENTESIS  02/21/2018  . IR PARACENTESIS  03/06/2018  . IR PARACENTESIS  03/17/2018  . IR PARACENTESIS  04/04/2018  . IR PARACENTESIS  12/28/2018  . IR PARACENTESIS  01/08/2019  . IR PARACENTESIS  01/23/2019  . IR PARACENTESIS  02/01/2019  . IR PARACENTESIS  02/19/2019  . IR PARACENTESIS  03/01/2019  . IR PARACENTESIS  03/15/2019  . IR PARACENTESIS  04/03/2019  . IR PARACENTESIS  04/12/2019  . IR PARACENTESIS  05/01/2019  . IR PARACENTESIS  05/08/2019  . IR PARACENTESIS  05/24/2019  . IR PARACENTESIS  06/12/2019  . IR PARACENTESIS  07/09/2019  . IR RADIOLOGIST EVAL & MGMT  02/14/2018  . IR RADIOLOGIST EVAL & MGMT  02/22/2019  . LEFT HEART CATH AND CORONARY ANGIOGRAPHY N/A 02/22/2017   Procedure: LEFT HEART CATH AND CORONARY ANGIOGRAPHY;  Surgeon: Nigel Mormon, MD;  Location: [facility];  Service: Cardiovascular;  Laterality: N/A;  . LIGATION OF ARTERIOVENOUS  FISTULA Left 11/11/8411   Procedure: Plication of Left Arm Arteriovenous Fistula;  Surgeon: Elam Dutch, MD;  Location: [facility];  Service: Vascular;  Laterality: Left;  .  POLYPECTOMY    . POLYPECTOMY  01/25/2018   Procedure: POLYPECTOMY;  Surgeon: Jerene Bears, MD;  Location: [facility] [facility];  Service: Gastroenterology;;  . REVISON OF ARTERIOVENOUS FISTULA Left 2/44/0102   Procedure: PLICATION OF DISTAL ANEURYSMAL SEGEMENT OF LEFT UPPER ARM ARTERIOVENOUS FISTULA;  Surgeon: Elam Dutch, MD;  Location: [facility];  Service: Vascular;  Laterality: Left;  . REVISON OF ARTERIOVENOUS FISTULA Left 02/03/3663   Procedure: Plication of Left Upper Arm Fistula ;  Surgeon: Waynetta Sandy, MD;  Location: [facility];  Service: Vascular;  Laterality: Left;  . SKIN GRAFT SPLIT THICKNESS LEG / FOOT Left    SKIN GRAFT SPLIT THICKNESS LEFT ARM DONOR SITE: LEFT ANTERIOR THIGH  . SKIN SPLIT GRAFT Left 07/04/2017   Procedure: SKIN GRAFT SPLIT THICKNESS LEFT ARM DONOR SITE: LEFT ANTERIOR THIGH;  Surgeon: Elam Dutch, MD;  Location: [place] [facility];  Service: Vascular;  Laterality: Left;  . THROMBECTOMY W/ EMBOLECTOMY Left 06/05/2017   Procedure: EXPLORATION OF LEFT ARM FOR BLEEDING; OVERSEWED PROXIMAL FISTULA;  Surgeon: Angelia Mould, MD;  Location: [facility];  Service: Vascular;  Laterality: Left;  . WOUND EXPLORATION Left 06/03/2017   Procedure: WOUND EXPLORATION WITH WOUND VAC APPLICATION TO LEFT ARM;  Surgeon: Angelia Mould, MD;  Location: [facility];  Service: Vascular;  Laterality: Left;       Family History  Problem Relation Age of Onset  . Heart disease Mother   . Lung cancer Mother   . Heart disease Father   . Malignant hyperthermia Father   . COPD Father   . Throat cancer Sister   . Esophageal cancer Sister   . Hypertension Other   . COPD Other   . Colon cancer Neg Hx   . Colon polyps Neg Hx   . Rectal cancer Neg Hx   . Stomach cancer Neg Hx     Social History   Tobacco Use  . Smoking status: Current Every Day Smoker    Packs/day: 0.50    Years: 43.00    Pack years: 21.50    Types: Cigarettes    Start date: 08/13/1973  . Smokeless tobacco: Never  Used  . Tobacco comment: i dont know i just make   Substance Use Topics  . Alcohol use: Not Currently    Comment: quit drinking in 2017  . Drug use: Not Currently    Types: Marijuana, Cocaine    Comment: reports using once every 3 months, 04-06-2019 was this     Home Medications Prior to Admission medications   Medication Sig Start Date End Date Taking? Authorizing Provider  albuterol (VENTOLIN HFA) 108 (90 Base) MCG/ACT inhaler Inhale 2 puffs into the lungs every 6 (six) hours as needed for wheezing or shortness of breath.    [provider]  amiodarone (PACERONE) 200 MG tablet Take 1 tablet (200 mg total) by mouth daily. 07/22/19 08/21/19  Lucrezia Starch, MD  hydrALAZINE (APRESOLINE) 100 MG tablet Take 0.5 tablets (50 mg total) by mouth 3 (three) times daily. 05/31/19   [place], Carley D, DO  lactulose (CHRONULAC) 10 GM/15ML solution TAKE 45 MLS BY MOUTH 2 TIMES DAILY. Patient taking differently: Take 30 g by mouth 2 (two) times daily.  06/04/19   Al Decant, MD  loperamide (IMODIUM A-D) 2 MG tablet Take 2 mg by mouth 4 (four) times daily as needed for diarrhea or loose stools.    [provider]  metoprolol tartrate (LOPRESSOR) 100 MG tablet Take 1 tablet (100 mg total) by mouth 2 (two) times daily. 06/04/19   Al Decant, MD  montelukast (SINGULAIR) 10 MG tablet Take 10 mg by mouth every evening.     [provider]  ondansetron (ZOFRAN) 8 MG tablet Take 8 mg by mouth every 4 (four) hours as needed. 06/14/19   [provider]  pantoprazole (PROTONIX) 40 MG tablet Take 1 tablet (40 mg total) by mouth daily. Patient taking differently: Take 40 mg by mouth See admin instructions. Take 40 mg by mouth in the morning before breakfast  and an additional 40 mg once a day, if symptoms persist 06/05/19   Maudie Mercury, MD  sevelamer carbonate (RENVELA) 800 MG tablet Take 4 tablets with meals  And 2 tablets twice daily with snacks Patient taking  differently: Take 1,600-2,400 mg by mouth See admin instructions. Take 2,400 mg by mouth three times a day with meals and 1,600 mg with each snack 01/22/18   Patrecia Pour, MD  sulfamethoxazole-trimethoprim (BACTRIM DS) 800-160 MG tablet Take 1 tablet by mouth daily. Patient taking differently: Take 1 tablet by mouth 2 (two) times daily.  12/30/18   Geradine Girt, DO  SYMBICORT 160-4.5 MCG/ACT inhaler Inhale 2 puffs into the lungs 2 (two) times daily.  11/30/18   [provider]  zolpidem (AMBIEN) 10 MG tablet Take 10 mg by mouth at bedtime as needed for sleep.    [provider]    Allergies    Morphine and related, Aspirin, Clonidine derivatives, Tramadol, and Tylenol [acetaminophen]  Review of Systems   Review of Systems  Respiratory: Positive for shortness of breath.   All other systems reviewed and are negative.   Physical Exam Updated Vital Signs BP (!) 136/98 (BP Location: Left Arm)   Pulse 71   Ht 5\' 10"  (1.778 m)   Wt 65.8 kg   SpO2 99%   BMI 20.81 kg/m   Physical Exam Vitals and nursing note reviewed.  Constitutional:      Appearance: He is well-developed.  HENT:     Head: Normocephalic and atraumatic.  Eyes:     Conjunctiva/sclera: Conjunctivae normal.     Pupils: Pupils are equal, round, and reactive to light.  Cardiovascular:     Rate and Rhythm: Normal rate and regular rhythm.     Heart sounds: Normal heart sounds.  Pulmonary:     Effort: Pulmonary effort is normal.     Breath sounds: Normal breath sounds. No wheezing or rhonchi.     Comments: O2 sats 100% on RA, no distress, talking easily during conversation Abdominal:     General: Bowel sounds are normal. There is distension.     Palpations: Abdomen is soft.     Comments: Distended (?chronic), no peritoneal signs  Musculoskeletal:        General: Normal range of motion.     Cervical back: Normal range of motion.  Skin:    General: Skin is warm and dry.  Neurological:     Mental  Status: He is alert and oriented to person, place, and time.     ED Results / Procedures / Treatments   Labs (all labs ordered are listed, but only abnormal results are displayed) Labs Reviewed  COMPREHENSIVE METABOLIC PANEL - Abnormal; Notable for the following components:      Result Value   Potassium 6.0 (*)    Chloride 97 (*)    Glucose, Bld 113 (*)    BUN 40 (*)    Creatinine, Ser 9.95 (*)    GFR calc non Af Amer 5 (*)    GFR calc Af Amer 6 (*)    Anion gap 17 (*)    All other components within normal limits  CBC WITH DIFFERENTIAL/PLATELET - Abnormal; Notable for the following components:   RBC 3.56 (*)    Hemoglobin 9.7 (*)    HCT 28.2 (*)    MCV 79.2 (*)    Platelets 129 (*)    All other components within normal limits  CBC WITH DIFFERENTIAL/PLATELET    EKG EKG  Interpretation  Date/Time:  Monday July 23 2019 04:43:36 EST Ventricular Rate:  85 PR Interval:    QRS Duration: 177 QT Interval:  450 QTC Calculation: 536 R Axis:   -84 Text Interpretation: Sinus rhythm Prolonged PR interval Consider left atrial enlargement Nonspecific IVCD with LAD LVH with secondary repolarization abnormality When compared with ECG of 07/22/2019, No significant change was found Confirmed by Delora Fuel ([id number]) on 07/23/2019 4:53:27 AM   Radiology DG Chest Port 1 View  Result Date: 07/23/2019 CLINICAL DATA:  Shortness of breath EXAM: PORTABLE CHEST 1 VIEW COMPARISON:  July 21, 2018 FINDINGS: There is mild cardiomegaly. Aortic knob calcifications. Mild prominence of the central pulmonary vasculature. The visualized skeletal structures are unremarkable. IMPRESSION: Cardiomegaly and mild pulmonary vascular congestion. Electronically Signed   By: Prudencio Pair M.D.   On: 07/23/2019 06:25   DG Chest Portable 1 View  Result Date: 07/22/2019 CLINICAL DATA:  Chest pain EXAM: PORTABLE CHEST 1 VIEW COMPARISON:  July 17, 2019 FINDINGS: Lungs clear. There is cardiomegaly with pulmonary  vascularity normal. No adenopathy. There is aortic atherosclerosis. No bone lesions. There is calcification in both carotid arteries as well as in the right subclavian artery. IMPRESSION: Stable cardiomegaly. Lungs clear. Aortic Atherosclerosis (ICD10-I70.0). Electronically Signed   By: Lowella Grip III M.D.   On: 07/22/2019 09:59    Procedures Procedures (including critical care time)  Medications Ordered in ED Medications - No data to display  ED Course  I have reviewed the triage vital signs and the nursing notes.  Pertinent labs & imaging results that were available during my care of the patient were reviewed by me and considered in my medical decision making (see chart for details).    MDM Rules/Calculators/A&P  59 y.o. M here with SOB.  Initially AFIB rate of 160's with hypoxia, converted spontaneously with EMS and now VSS.  Seen yesterday for similar.  Has hx of ESRD on HD, scheduled for treatment this AM at 0830.  He is afebrile and nontoxic, vitals are stable on room air.  He is not displaying any signs of respiratory distress.  He remains in normal sinus rhythm.  Plan for screening labs, portable chest x-ray.  EKG without any significant ischemic changes.   Does have some abdominal distention, it sounds like this is somewhat chronic due to cirrhosis.  No fever or peritoneal signs.  Doubt SBP.  CMP with K+ of 6.0-- due for dialysis at 0830 this AM.  CXR with some findings of fluid overload but grossly unchanged from yesterday.   Remains hemodynamically stable on RA, remains in NSR.  Feel patient is stable to go to dialysis this AM.  He will follow-up with cardiology as an OP for PAF, continue amiodarone as instructed yesterday.  In regards to abdominal distention, can continue scheduling OP paracentesis as needed.  He may return here for any new/acute changes.  Discussed with attending physician, Dr. Roxanne Mins, who agrees with assessment and plan of care.  Final Clinical  Impression(s) / ED Diagnoses Final diagnoses:  SOB (shortness of breath)    Rx / DC Orders ED Discharge Orders    None       Larene Pickett, PA-C 37/85/88 [id number]    Delora Fuel, MD 74/12/87 [phone]

## 2019-07-23 NOTE — ED Triage Notes (Signed)
Pt from home, woke up SOB, hx of COPD, 89% on RA, on rebreather 10L at 100%.  Hx of afib, initially was at 160 however he converted himself down to between 90-60.  Dialysis is M,W,F, full treatment last Friday. Stomach is swollen and tight.  198/100 Heart rate 60-90

## 2019-07-24 NOTE — Telephone Encounter (Signed)
Pt returned your call. He stated that he needs to schedule paracentesis asap. Pls call him.

## 2019-07-24 NOTE — Telephone Encounter (Signed)
Patient scheduled for Paracentesis at [facility] on Friday 07/27/19 @ 1 pm.

## 2019-07-24 NOTE — Telephone Encounter (Signed)
Patient informed of paracentesis.

## 2019-07-25 IMAGING — CR DG ABDOMEN ACUTE W/ 1V CHEST
4 series · 4 of 4 positions shown · non-contrast
Comparison: CT scan of the abdomen dated 01/02/2018 and chest x-ray
dated 12/30/2017

CLINICAL DATA: Abdominal pain and distention.

EXAM:
DG ABDOMEN ACUTE W/ 1V CHEST

[chest pa]
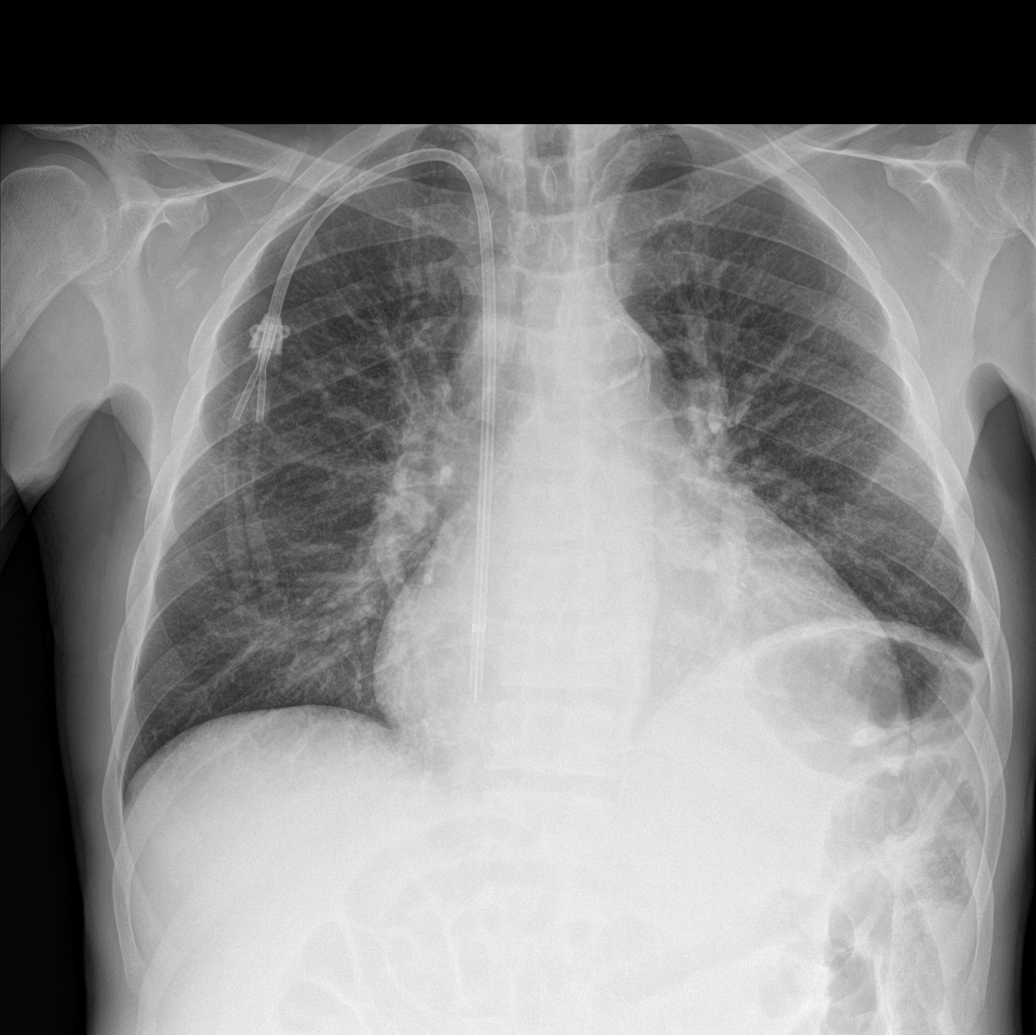

[abdomen erect]
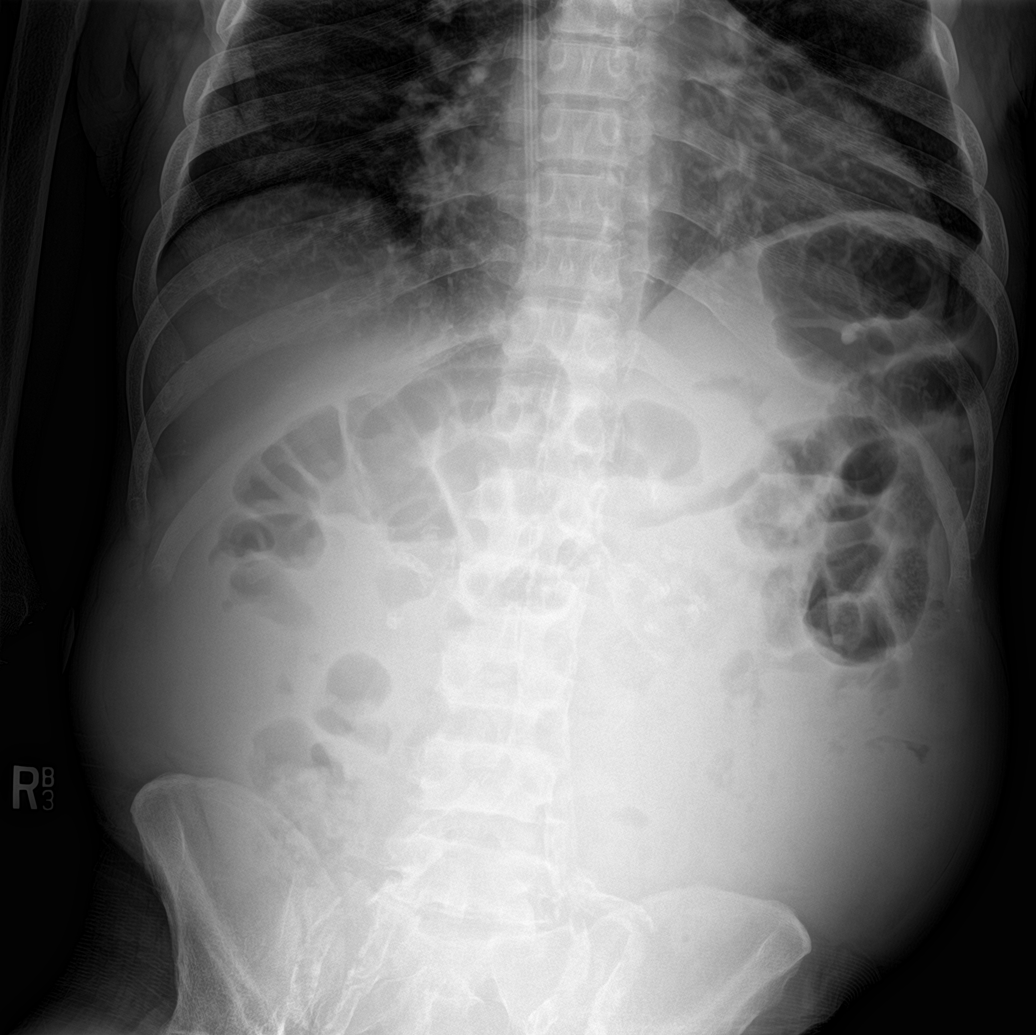

[abdomen supine (1 of 2)]
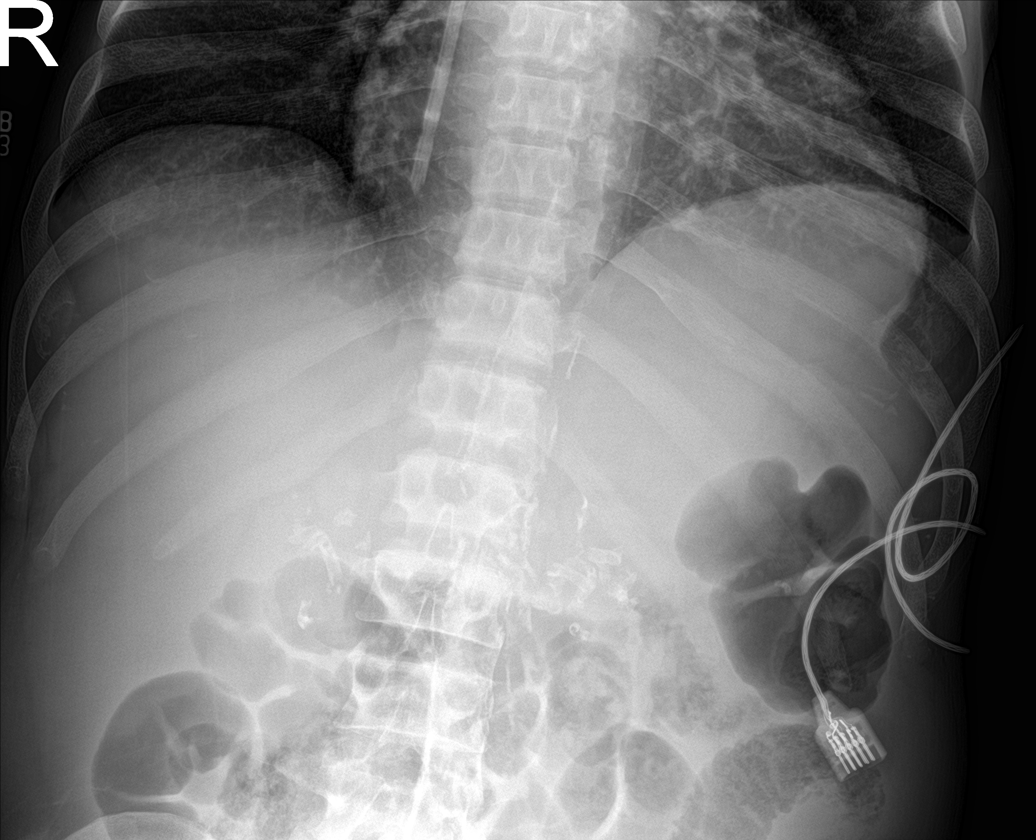

[abdomen supine (2 of 2)]
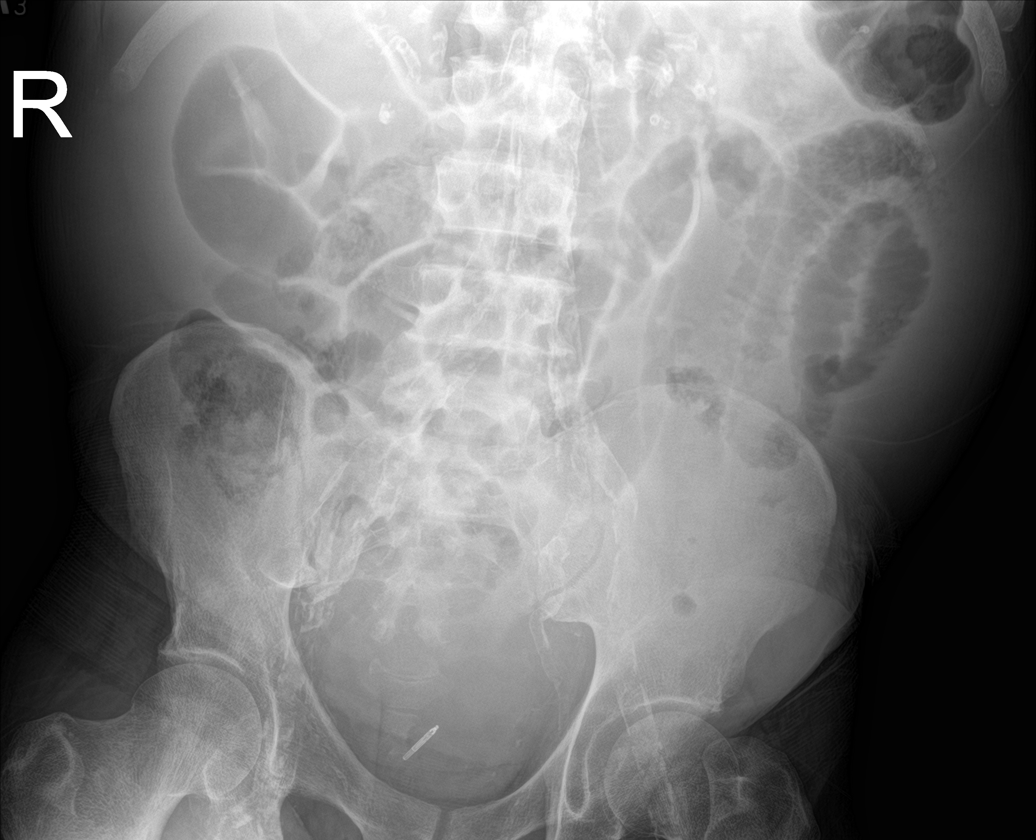

[4 of 4 positions shown; findings below may reference images not displayed]

FINDINGS: There are no dilated loops of bowel. Extensive ascites in the
abdomen. Extensive arterial vascular calcifications throughout the
abdomen.

Chronic cardiomegaly. Double lumen central venous catheter in place
with the tips in the right atrium, unchanged. Minimal blunting of
the left costophrenic angle laterally.

Small metallic device in the rectum, unchanged since the prior CT
scan.
IMPRESSION: Prominent ascites.  No significant change since the prior CT scan.

## 2019-07-25 IMAGING — US IR PARACENTESIS
1 series · 2 of 2 positions shown · non-contrast
Comparison: none

INDICATION: Ascites secondary to alcoholic cirrhosis. Request of therapeutic
paracentesis.

[Series 1: ir (id) (id)/(id)/(id) ir · 2 of 2 slices shown]
[im 1/2]
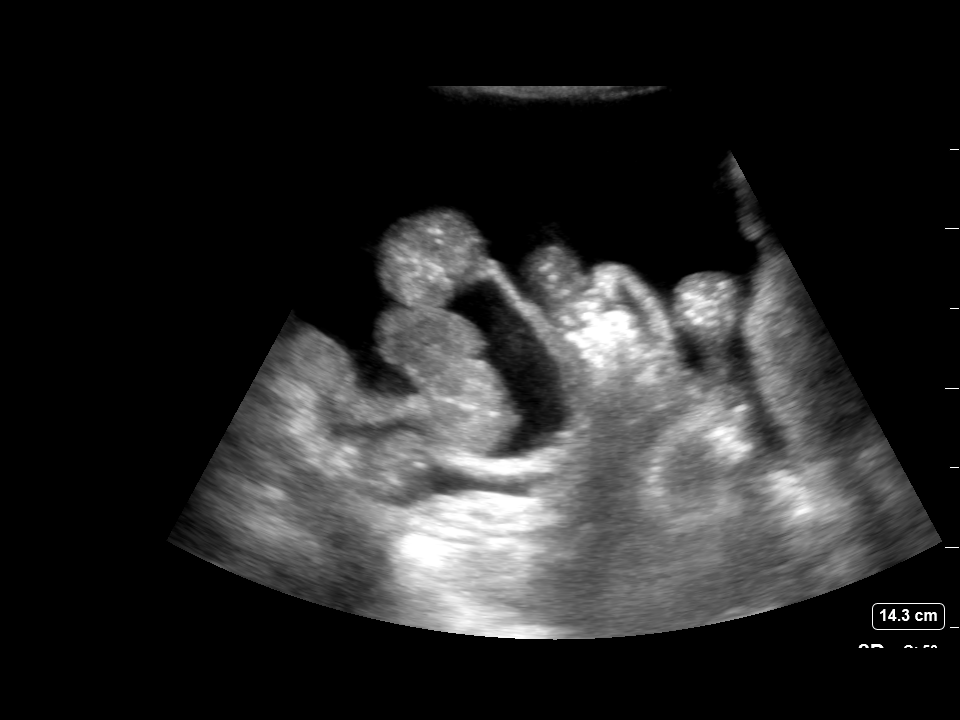
[im 2/2]
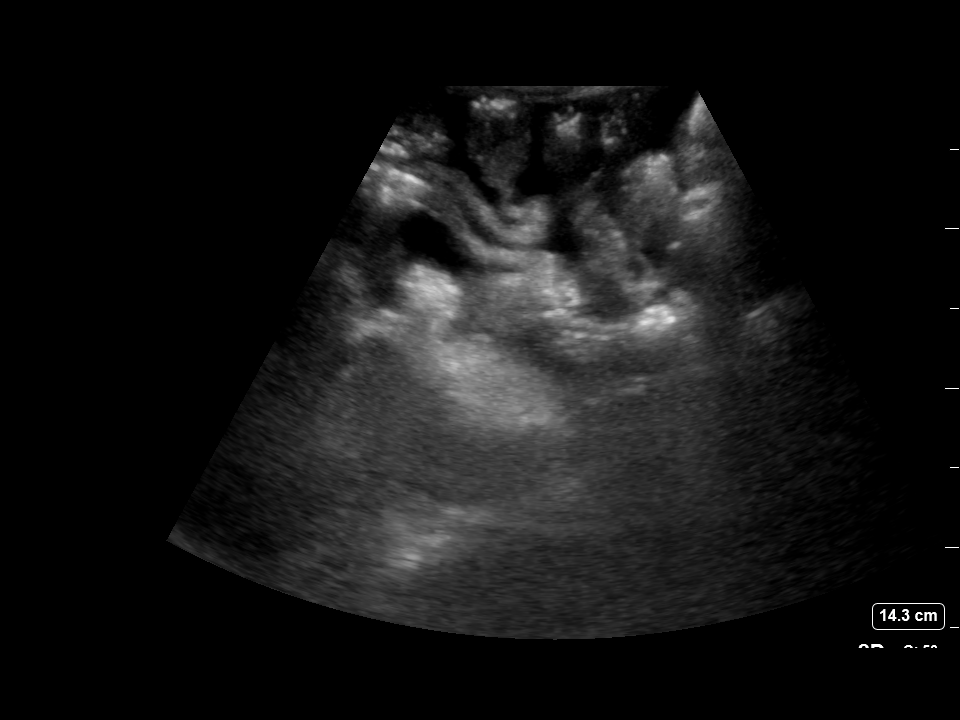

[2 of 2 positions shown; findings below may reference images not displayed]

EXAM:
ULTRASOUND GUIDED therapeutic PARACENTESIS

MEDICATIONS:
10 mL 2% lidocaine

COMPLICATIONS:
None immediate.

PROCEDURE:
Informed written consent was obtained from the patient after a
discussion of the risks, benefits and alternatives to treatment. A
timeout was performed prior to the initiation of the procedure.

Initial ultrasound scanning demonstrates a moderate amount of
ascites within the left lower abdominal quadrant. The left lower
abdomen was prepped and draped in the usual sterile fashion. 2%
lidocaine was used for local anesthesia.

Following this, a 19 gauge, 7-cm, Yueh catheter was introduced. An
ultrasound image was saved for documentation purposes. The
paracentesis was performed. The catheter was removed and a dressing
was applied. The patient tolerated the procedure well without
immediate post procedural complication.
FINDINGS: A total of approximately 1.6L of clear yellow fluid was removed.
Samples were not sent to the laboratory as requested by the clinical
team.
IMPRESSION: Successful ultrasound-guided paracentesis yielding 1.6 liters of
peritoneal fluid.

Read by Rosales, Sagal

## 2019-07-26 ENCOUNTER — Ambulatory Visit: Payer: Medicare Other | Admitting: Cardiology

## 2019-07-27 ENCOUNTER — Other Ambulatory Visit: Payer: Self-pay

## 2019-07-27 ENCOUNTER — Ambulatory Visit (HOSPITAL_COMMUNITY)
Admission: RE | Admit: 2019-07-27 | Discharge: 2019-07-27 | Disposition: A | Discharge status: Home / Self Care | Payer: Medicare Other | Source: Ambulatory Visit | Attending: Gastroenterology | Admitting: Gastroenterology

## 2019-07-27 DIAGNOSIS — K7460 Unspecified cirrhosis of liver: Secondary | ICD-10-CM | POA: Insufficient documentation

## 2019-07-27 DIAGNOSIS — R188 Other ascites: Secondary | ICD-10-CM | POA: Insufficient documentation

## 2019-07-27 HISTORY — PX: IR PARACENTESIS: IMG2679

## 2019-07-27 LAB — BODY FLUID CELL COUNT WITH DIFFERENTIAL
Eos, Fluid: 0 %
Lymphs, Fluid: 9 %
Monocyte-Macrophage-Serous Fluid: 89 % (ref 50–90)
Neutrophil Count, Fluid: 2 % (ref 0–25)
Total Nucleated Cell Count, Fluid: 1073 cu mm — ABNORMAL HIGH (ref 0–1000)

## 2019-07-27 LAB — GRAM STAIN

## 2019-07-27 MED ORDER — LIDOCAINE HCL (PF) 1 % IJ SOLN
INTRAMUSCULAR | Status: DC | PRN
  Administered 2019-07-27: 30 mL

## 2019-07-27 MED ORDER — LIDOCAINE HCL 1 % IJ SOLN
INTRAMUSCULAR | Status: AC
  Filled 2019-07-27: qty 20

## 2019-07-27 NOTE — Procedures (Signed)
PROCEDURE SUMMARY:  Successful image-guided paracentesis from the left lower abdomen.  Yielded 3.2 liters of serosanguineous fluid.  No immediate complications.  EBL: zero Patient tolerated well.   Specimen was sent for labs.  Please see imaging section of Epic for full dictation.  Joaquim Nam PA-C 07/27/2019 1:15 PM

## 2019-07-29 ENCOUNTER — Emergency Department (HOSPITAL_COMMUNITY)
Admission: EM | Admit: 2019-07-29 | Discharge: 2019-07-29 | Disposition: A | Discharge status: Home / Self Care | Payer: Medicare Other | Attending: Emergency Medicine | Admitting: Emergency Medicine

## 2019-07-29 ENCOUNTER — Encounter (HOSPITAL_COMMUNITY): Payer: Self-pay | Admitting: Emergency Medicine

## 2019-07-29 ENCOUNTER — Encounter (HOSPITAL_COMMUNITY): Payer: Self-pay | Admitting: Pharmacy Technician

## 2019-07-29 ENCOUNTER — Other Ambulatory Visit: Payer: Self-pay

## 2019-07-29 ENCOUNTER — Non-Acute Institutional Stay (HOSPITAL_COMMUNITY)
Admission: EM | Admit: 2019-07-29 | Discharge: 2019-07-30 | Disposition: A | Discharge status: Home / Self Care | Payer: Medicare Other | Source: Home / Self Care | Attending: Emergency Medicine | Admitting: Emergency Medicine

## 2019-07-29 ENCOUNTER — Emergency Department (HOSPITAL_COMMUNITY): Payer: Medicare Other

## 2019-07-29 DIAGNOSIS — J449 Chronic obstructive pulmonary disease, unspecified: Secondary | ICD-10-CM | POA: Insufficient documentation

## 2019-07-29 DIAGNOSIS — I252 Old myocardial infarction: Secondary | ICD-10-CM | POA: Insufficient documentation

## 2019-07-29 DIAGNOSIS — Z7951 Long term (current) use of inhaled steroids: Secondary | ICD-10-CM | POA: Insufficient documentation

## 2019-07-29 DIAGNOSIS — I2510 Atherosclerotic heart disease of native coronary artery without angina pectoris: Secondary | ICD-10-CM | POA: Insufficient documentation

## 2019-07-29 DIAGNOSIS — E875 Hyperkalemia: Secondary | ICD-10-CM | POA: Diagnosis present

## 2019-07-29 DIAGNOSIS — Z20822 Contact with and (suspected) exposure to covid-19: Secondary | ICD-10-CM | POA: Insufficient documentation

## 2019-07-29 DIAGNOSIS — R109 Unspecified abdominal pain: Secondary | ICD-10-CM | POA: Diagnosis not present

## 2019-07-29 DIAGNOSIS — R188 Other ascites: Secondary | ICD-10-CM

## 2019-07-29 DIAGNOSIS — F1210 Cannabis abuse, uncomplicated: Secondary | ICD-10-CM | POA: Insufficient documentation

## 2019-07-29 DIAGNOSIS — Z992 Dependence on renal dialysis: Secondary | ICD-10-CM | POA: Diagnosis not present

## 2019-07-29 DIAGNOSIS — Z79899 Other long term (current) drug therapy: Secondary | ICD-10-CM | POA: Insufficient documentation

## 2019-07-29 DIAGNOSIS — I120 Hypertensive chronic kidney disease with stage 5 chronic kidney disease or end stage renal disease: Secondary | ICD-10-CM | POA: Insufficient documentation

## 2019-07-29 DIAGNOSIS — I4821 Permanent atrial fibrillation: Secondary | ICD-10-CM | POA: Insufficient documentation

## 2019-07-29 DIAGNOSIS — Z955 Presence of coronary angioplasty implant and graft: Secondary | ICD-10-CM | POA: Insufficient documentation

## 2019-07-29 DIAGNOSIS — I480 Paroxysmal atrial fibrillation: Secondary | ICD-10-CM | POA: Insufficient documentation

## 2019-07-29 DIAGNOSIS — E889 Metabolic disorder, unspecified: Secondary | ICD-10-CM | POA: Insufficient documentation

## 2019-07-29 DIAGNOSIS — Z888 Allergy status to other drugs, medicaments and biological substances status: Secondary | ICD-10-CM | POA: Insufficient documentation

## 2019-07-29 DIAGNOSIS — G8929 Other chronic pain: Secondary | ICD-10-CM

## 2019-07-29 DIAGNOSIS — F17210 Nicotine dependence, cigarettes, uncomplicated: Secondary | ICD-10-CM | POA: Insufficient documentation

## 2019-07-29 DIAGNOSIS — K219 Gastro-esophageal reflux disease without esophagitis: Secondary | ICD-10-CM | POA: Insufficient documentation

## 2019-07-29 DIAGNOSIS — F1410 Cocaine abuse, uncomplicated: Secondary | ICD-10-CM | POA: Insufficient documentation

## 2019-07-29 DIAGNOSIS — K7460 Unspecified cirrhosis of liver: Secondary | ICD-10-CM | POA: Diagnosis not present

## 2019-07-29 DIAGNOSIS — Z886 Allergy status to analgesic agent status: Secondary | ICD-10-CM | POA: Insufficient documentation

## 2019-07-29 DIAGNOSIS — Z8249 Family history of ischemic heart disease and other diseases of the circulatory system: Secondary | ICD-10-CM | POA: Insufficient documentation

## 2019-07-29 DIAGNOSIS — N186 End stage renal disease: Secondary | ICD-10-CM | POA: Insufficient documentation

## 2019-07-29 DIAGNOSIS — R0602 Shortness of breath: Secondary | ICD-10-CM

## 2019-07-29 DIAGNOSIS — Z885 Allergy status to narcotic agent status: Secondary | ICD-10-CM | POA: Insufficient documentation

## 2019-07-29 LAB — CBC WITH DIFFERENTIAL/PLATELET
Abs Immature Granulocytes: 0.02 10*3/uL (ref 0.00–0.07)
Basophils Absolute: 0.1 10*3/uL (ref 0.0–0.1)
Basophils Relative: 1 %
Eosinophils Absolute: 0.2 10*3/uL (ref 0.0–0.5)
Eosinophils Relative: 2 %
HCT: 29.3 % — ABNORMAL LOW (ref 39.0–52.0)
Hemoglobin: 9.7 g/dL — ABNORMAL LOW (ref 13.0–17.0)
Immature Granulocytes: 0 %
Lymphocytes Relative: 17 %
Lymphs Abs: 1.1 10*3/uL (ref 0.7–4.0)
MCH: 26.9 pg (ref 26.0–34.0)
MCHC: 33.1 g/dL (ref 30.0–36.0)
MCV: 81.2 fL (ref 80.0–100.0)
Monocytes Absolute: 0.6 10*3/uL (ref 0.1–1.0)
Monocytes Relative: 10 %
Neutro Abs: 4.7 10*3/uL (ref 1.7–7.7)
Neutrophils Relative %: 70 %
Platelets: 127 10*3/uL — ABNORMAL LOW (ref 150–400)
RBC: 3.61 MIL/uL — ABNORMAL LOW (ref 4.22–5.81)
RDW: 15.5 % (ref 11.5–15.5)
WBC: 6.7 10*3/uL (ref 4.0–10.5)
nRBC: 0 % (ref 0.0–0.2)

## 2019-07-29 LAB — CBG MONITORING, ED
Glucose-Capillary: 27 mg/dL — CL (ref 70–99)
Glucose-Capillary: 64 mg/dL — ABNORMAL LOW (ref 70–99)
Glucose-Capillary: 114 mg/dL — ABNORMAL HIGH (ref 70–99)

## 2019-07-29 LAB — CBC
HCT: 29.9 % — ABNORMAL LOW (ref 39.0–52.0)
Hemoglobin: 10.2 g/dL — ABNORMAL LOW (ref 13.0–17.0)
MCH: 27.2 pg (ref 26.0–34.0)
MCHC: 34.1 g/dL (ref 30.0–36.0)
MCV: 79.7 fL — ABNORMAL LOW (ref 80.0–100.0)
Platelets: 131 10*3/uL — ABNORMAL LOW (ref 150–400)
RBC: 3.75 MIL/uL — ABNORMAL LOW (ref 4.22–5.81)
RDW: 15.2 % (ref 11.5–15.5)
WBC: 7.7 10*3/uL (ref 4.0–10.5)
nRBC: 0 % (ref 0.0–0.2)

## 2019-07-29 LAB — BASIC METABOLIC PANEL
Anion gap: 12 (ref 5–15)
Anion gap: 12 (ref 5–15)
BUN: 29 mg/dL — ABNORMAL HIGH (ref 6–20)
BUN: 34 mg/dL — ABNORMAL HIGH (ref 6–20)
CO2: 29 mmol/L (ref 22–32)
CO2: 27 mmol/L (ref 22–32)
Calcium: 9.6 mg/dL (ref 8.9–10.3)
Calcium: 9.7 mg/dL (ref 8.9–10.3)
Chloride: 95 mmol/L — ABNORMAL LOW (ref 98–111)
Chloride: 97 mmol/L — ABNORMAL LOW (ref 98–111)
Creatinine, Ser: 7.11 mg/dL — ABNORMAL HIGH (ref 0.61–1.24)
Creatinine, Ser: 7.71 mg/dL — ABNORMAL HIGH (ref 0.61–1.24)
GFR calc Af Amer: 9 mL/min — ABNORMAL LOW (ref >60)
GFR calc Af Amer: 8 mL/min — ABNORMAL LOW (ref >60)
GFR calc non Af Amer: 8 mL/min — ABNORMAL LOW (ref >60)
GFR calc non Af Amer: 7 mL/min — ABNORMAL LOW (ref >60)
Glucose, Bld: 104 mg/dL — ABNORMAL HIGH (ref 70–99)
Glucose, Bld: 76 mg/dL (ref 70–99)
Potassium: 6.2 mmol/L — ABNORMAL HIGH (ref 3.5–5.1)
Potassium: 6.8 mmol/L (ref 3.5–5.1)
Sodium: 136 mmol/L (ref 135–145)
Sodium: 136 mmol/L (ref 135–145)

## 2019-07-29 LAB — TROPONIN I (HIGH SENSITIVITY)
Troponin I (High Sensitivity): 28 ng/L — ABNORMAL HIGH (ref <18)
Troponin I (High Sensitivity): 61 ng/L — ABNORMAL HIGH (ref <18)
Troponin I (High Sensitivity): 57 ng/L — ABNORMAL HIGH (ref <18)

## 2019-07-29 LAB — POCT I-STAT EG7
Acid-Base Excess: 10 mmol/L — ABNORMAL HIGH (ref 0.0–2.0)
Bicarbonate: 32.9 mmol/L — ABNORMAL HIGH (ref 20.0–28.0)
Calcium, Ion: 1.1 mmol/L — ABNORMAL LOW (ref 1.15–1.40)
HCT: 30 % — ABNORMAL LOW (ref 39.0–52.0)
Hemoglobin: 10.2 g/dL — ABNORMAL LOW (ref 13.0–17.0)
O2 Saturation: 99 %
Potassium: 6.2 mmol/L — ABNORMAL HIGH (ref 3.5–5.1)
Sodium: 134 mmol/L — ABNORMAL LOW (ref 135–145)
TCO2: 34 mmol/L — ABNORMAL HIGH (ref 22–32)
pCO2, Ven: 34.9 mmHg — ABNORMAL LOW (ref 44.0–60.0)
pH, Ven: 7.583 — ABNORMAL HIGH (ref 7.250–7.430)
pO2, Ven: 134 mmHg — ABNORMAL HIGH (ref 32.0–45.0)

## 2019-07-29 LAB — RESPIRATORY PANEL BY RT PCR (FLU A&B, COVID)
Influenza A by PCR: NEGATIVE
Influenza B by PCR: NEGATIVE
SARS Coronavirus 2 by RT PCR: NEGATIVE

## 2019-07-29 MED ORDER — LOKELMA 10 G PO PACK: 10.0000 g | PACK | Freq: Once | ORAL | 0 refills | Status: AC

## 2019-07-29 MED ORDER — DEXTROSE 50 % IV SOLN
INTRAVENOUS | Status: AC
  Filled 2019-07-29: qty 50

## 2019-07-29 MED ORDER — HYDROMORPHONE HCL 1 MG/ML IJ SOLN
1.0000 mg | Freq: Once | INTRAMUSCULAR | Status: AC
  Administered 2019-07-29: 1 mg via INTRAVENOUS
  Filled 2019-07-29: qty 1

## 2019-07-29 MED ORDER — SODIUM CHLORIDE 0.9% FLUSH
3.0000 mL | Freq: Once | INTRAVENOUS | Status: AC
  Administered 2019-07-29: 3 mL via INTRAVENOUS

## 2019-07-29 MED ORDER — ONDANSETRON HCL 4 MG/2ML IJ SOLN
4.0000 mg | Freq: Once | INTRAMUSCULAR | Status: AC
  Administered 2019-07-29: 4 mg via INTRAVENOUS
  Filled 2019-07-29: qty 2

## 2019-07-29 MED ORDER — DEXTROSE 50 % IV SOLN
50.0000 mL | Freq: Once | INTRAVENOUS | Status: AC
  Administered 2019-07-29: 50 mL via INTRAVENOUS

## 2019-07-29 MED ORDER — SODIUM ZIRCONIUM CYCLOSILICATE 10 G PO PACK
10.0000 g | PACK | Freq: Once | ORAL | Status: AC
  Administered 2019-07-29: 08:00:00 10 g via ORAL
  Filled 2019-07-29: qty 1

## 2019-07-29 MED ORDER — CHLORHEXIDINE GLUCONATE CLOTH 2 % EX PADS: 6.0000 | MEDICATED_PAD | Freq: Every day | CUTANEOUS | Status: DC

## 2019-07-29 MED ORDER — CALCIUM GLUCONATE-NACL 1-0.675 GM/50ML-% IV SOLN
1.0000 g | Freq: Once | INTRAVENOUS | Status: AC
  Administered 2019-07-29: 1000 mg via INTRAVENOUS
  Filled 2019-07-29: qty 50

## 2019-07-29 MED ORDER — INSULIN ASPART 100 UNIT/ML IV SOLN
5.0000 [IU] | Freq: Once | INTRAVENOUS | Status: AC
  Administered 2019-07-29: 5 [IU] via INTRAVENOUS

## 2019-07-29 MED ORDER — DEXTROSE 50 % IV SOLN
1.0000 | Freq: Once | INTRAVENOUS | Status: AC
  Administered 2019-07-29: 50 mL via INTRAVENOUS
  Filled 2019-07-29: qty 50

## 2019-07-29 MED ORDER — HYDROMORPHONE HCL 1 MG/ML IJ SOLN
0.5000 mg | Freq: Once | INTRAMUSCULAR | Status: AC
  Administered 2019-07-29: 0.5 mg via INTRAVENOUS
  Filled 2019-07-29: qty 1

## 2019-07-29 MED ORDER — SODIUM ZIRCONIUM CYCLOSILICATE 10 G PO PACK
10.0000 g | PACK | Freq: Once | ORAL | Status: AC
  Administered 2019-07-29: 10 g via ORAL
  Filled 2019-07-29: qty 1

## 2019-07-29 NOTE — ED Provider Notes (Signed)
[facility] EMERGENCY DEPARTMENT Provider Note   CSN: [id number] Arrival date & time: 07/29/19  1606     History No chief complaint on file. cc sob  [name] is a 59 y.o. male.  He is frequently in the ED.  He was just discharged this morning after being seen for shortness of breath.  He said he went home and slept and when he woke up he still felt short of breath so called EMS.  He has chronic abdominal ascites and he said he was tapped 2 days ago.  He also has end-stage renal disease and is due for dialysis.  Per EMS he had a wide-complex tachycardia that converted spontaneously to a wide-complex rate in the 70s.  He has a history of arrhythmias before thought to be A. fib with aberrancy. complaining of abdominal tightness and shortness of breath.  The history is provided by the patient.  Shortness of Breath Severity:  Moderate Onset quality:  Gradual Timing:  Constant Progression:  Unchanged Chronicity:  Recurrent Relieved by:  Nothing Worsened by:  Activity Ineffective treatments:  None tried Associated symptoms: abdominal pain   Associated symptoms: no chest pain, no fever, no headaches, no hemoptysis, no neck pain, no rash, no sore throat and no sputum production        Past Medical History:  Diagnosis Date  . Anemia   . Anxiety   . Arthritis    left shoulder  . Atherosclerosis of aorta ([place])   . Cardiomegaly   . Chest pain    DATE UNKNOWN, C/O PERIODICALLY  . Cocaine abuse ([place])   . COPD exacerbation ([place]) 08/17/2016  . Coronary artery disease    stent 02/22/17  . ESRD (end stage renal disease) on dialysis ([place])    "[name]; MWF" (07/04/2017)  . GERD (gastroesophageal reflux disease)    DATE UNKNOWN  . Hemorrhoids   . Hepatitis B, chronic ([place])   . Hepatitis C   . History of kidney stones   . Hyperkalemia   . Hypertension   . Kidney failure   . Metabolic bone disease    Patient denies  . Mitral stenosis   . Myocardial  infarction ([place])   . Pneumonia   . Pulmonary edema   . Solitary rectal ulcer syndrome 07/2017   at [facility] for rectal bleeding  . Tubular adenoma of colon     Patient Active Problem List   Diagnosis Date Noted  . Dyspnea 06/09/2019  . Acquired thrombophilia ([place]) 06/05/2019  . A-fib ([place]) 05/30/2019  . Atrial fibrillation with RVR ([place]) 05/29/2019  . Melena   . Pressure injury of skin 03/09/2019  . Abdominal distention   . Volume overload 12/28/2018  . Sepsis ([place]) 09/12/2018  . Atherosclerosis of native coronary artery of native heart without angina pectoris 03/11/2018  . Benign neoplasm of cecum   . Benign neoplasm of ascending colon   . Benign neoplasm of descending colon   . Benign neoplasm of rectum   . Paroxysmal atrial fibrillation ([place]) 01/23/2018  . Hx of colonic polyps 01/20/2018  . ESRD (end stage renal disease) ([place]) 11/21/2017  . GERD (gastroesophageal reflux disease) 11/16/2017  . Liver cirrhosis ([place]) 11/15/2017  . DNR (do not resuscitate)   . Palliative care by specialist   . Hyponatremia 11/04/2017  . SBP (spontaneous bacterial peritonitis) ([place]) 10/30/2017  . Liver disease, chronic 10/30/2017  . SOB (shortness of breath)   . Abdominal pain 10/28/2017  . Upper airway  cough syndrome with flattening on f/v loop 10/13/17 c/w vcd 10/17/2017  . Elevated diaphragm 10/13/2017  . Ileus ([place]) 09/29/2017  . QT prolongation 09/29/2017  . Malnutrition of moderate degree 09/29/2017  . Sinus congestion 09/03/2017  . Symptomatic anemia 09/02/2017  . Other cirrhosis of liver ([place]) 09/02/2017  . Left bundle branch block 09/02/2017  . Mitral stenosis 09/02/2017  . Hematochezia 07/15/2017  . Wide-complex tachycardia ([place])   . Endotracheally intubated   . ESRD on dialysis ([place]) 07/04/2017  . Acute respiratory failure with hypoxia ([place]) 06/18/2017  . CKD (chronic kidney disease) stage V requiring chronic dialysis ([place]) 06/18/2017  . History of Cocaine abuse ([place])  06/18/2017  . Hypertension 06/18/2017  . Infection of AV graft for dialysis ([place]) 06/18/2017  . Anxiety 06/18/2017  . Anemia due to chronic kidney disease 06/18/2017  . Atypical atrial flutter ([place]) 06/18/2017  . Personality disorder ([place]) 06/13/2017  . Cellulitis 06/12/2017  . Adjustment disorder with mixed anxiety and depressed mood 06/10/2017  . Suicidal ideation 06/10/2017  . Arm wound, left, sequela 06/10/2017  . Dyspnea on exertion 05/29/2017  . Tachycardia 05/29/2017  . Hyperkalemia 05/22/2017  . Acute metabolic encephalopathy   . Anemia 04/23/2017  . Ascites 04/23/2017  . COPD (chronic obstructive pulmonary disease) ([place]) 04/23/2017  . Acute on chronic respiratory failure with hypoxia ([place]) 03/25/2017  . Arrhythmia 03/25/2017  . COPD GOLD 0 with flattening on inps f/v  09/27/2016  . Essential hypertension 09/27/2016  . Fluid overload 08/30/2016  . COPD exacerbation ([place]) 08/17/2016  . Hypertensive urgency 08/17/2016  . Respiratory failure ([place]) 08/17/2016  . Problem with dialysis access ([place]) 07/23/2016  . Chronic hepatitis B ([place]) 03/05/2014  . Chronic hepatitis C without hepatic coma ([place]) 03/05/2014  . Internal hemorrhoids with bleeding, swelling and itching 03/05/2014  . Thrombocytopenia ([place]) 03/05/2014  . Chest pain 02/27/2014  . Alcohol abuse 04/14/2009  . Cigarette smoker 04/14/2009  . GANGLION CYST 04/14/2009    Past Surgical History:  Procedure Laterality Date  . A/V FISTULAGRAM Left 05/26/2017   Procedure: A/V FISTULAGRAM;  Surgeon: Conrad Weatogue, MD;  Location: [facility];  Service: Cardiovascular;  Laterality: Left;  . A/V FISTULAGRAM Right 11/18/2017   Procedure: A/V FISTULAGRAM - Right Arm;  Surgeon: Elam Dutch, MD;  Location: [facility];  Service: Cardiovascular;  Laterality: Right;  . APPLICATION OF WOUND VAC Left 06/14/2017   Procedure: APPLICATION OF WOUND VAC;  Surgeon: Katha Cabal, MD;  Location: [facility];  Service:  Vascular;  Laterality: Left;  . AV FISTULA PLACEMENT  2012   BELIEVED WAS PLACED IN JUNE  . AV FISTULA PLACEMENT Right 08/09/2017   Procedure: Creation Right arm ARTERIOVENOUS BRACHIOCEPOHALIC FISTULA;  Surgeon: Elam Dutch, MD;  Location: [facility];  Service: Vascular;  Laterality: Right;  . AV FISTULA PLACEMENT Right 11/22/2017   Procedure: INSERTION OF ARTERIOVENOUS (AV) GORE-TEX GRAFT RIGHT UPPER ARM;  Surgeon: Elam Dutch, MD;  Location: [facility];  Service: Vascular;  Laterality: Right;  . BIOPSY  01/25/2018   Procedure: BIOPSY;  Surgeon: Jerene Bears, MD;  Location: [facility];  Service: Gastroenterology;;  . BIOPSY  04/10/2019   Procedure: BIOPSY;  Surgeon: Jerene Bears, MD;  Location: [facility];  Service: Gastroenterology;;  . COLONOSCOPY    . COLONOSCOPY WITH PROPOFOL N/A 01/25/2018   Procedure: COLONOSCOPY WITH PROPOFOL;  Surgeon: Jerene Bears, MD;  Location: [facility];  Service: Gastroenterology;  Laterality: N/A;  . CORONARY STENT  INTERVENTION N/A 02/22/2017   Procedure: CORONARY STENT INTERVENTION;  Surgeon: Nigel Mormon, MD;  Location: [facility];  Service: Cardiovascular;  Laterality: N/A;  . ESOPHAGOGASTRODUODENOSCOPY (EGD) WITH PROPOFOL N/A 01/25/2018   Procedure: ESOPHAGOGASTRODUODENOSCOPY (EGD) WITH PROPOFOL;  Surgeon: Jerene Bears, MD;  Location: [facility];  Service: Gastroenterology;  Laterality: N/A;  . ESOPHAGOGASTRODUODENOSCOPY (EGD) WITH PROPOFOL N/A 04/10/2019   Procedure: ESOPHAGOGASTRODUODENOSCOPY (EGD) WITH PROPOFOL;  Surgeon: Jerene Bears, MD;  [facility]: [facility];  Service: Gastroenterology;  Laterality: N/A;  . FLEXIBLE SIGMOIDOSCOPY N/A 07/15/2017   Procedure: FLEXIBLE SIGMOIDOSCOPY;  Surgeon: Carol Ada, MD;  Location: [facility];  Service: Endoscopy;  Laterality: N/A;  . HEMORRHOID BANDING    . I & D EXTREMITY Left 06/01/2017   Procedure: IRRIGATION AND DEBRIDEMENT LEFT ARM HEMATOMA WITH LIGATION OF LEFT ARM AV FISTULA;   Surgeon: Elam Dutch, MD;  Location: [facility];  Service: Vascular;  Laterality: Left;  . I & D EXTREMITY Left 06/14/2017   Procedure: IRRIGATION AND DEBRIDEMENT EXTREMITY;  Surgeon: Katha Cabal, MD;  Location: [facility];  Service: Vascular;  Laterality: Left;  . INSERTION OF DIALYSIS CATHETER  05/30/2017  . INSERTION OF DIALYSIS CATHETER N/A 05/30/2017   Procedure: INSERTION OF DIALYSIS CATHETER;  Surgeon: Elam Dutch, MD;  Location: [facility];  Service: Vascular;  Laterality: N/A;  . IR PARACENTESIS  08/30/2017  . IR PARACENTESIS  09/29/2017  . IR PARACENTESIS  10/28/2017  . IR PARACENTESIS  11/09/2017  . IR PARACENTESIS  11/16/2017  . IR PARACENTESIS  11/28/2017  . IR PARACENTESIS  12/01/2017  . IR PARACENTESIS  12/06/2017  . IR PARACENTESIS  01/03/2018  . IR PARACENTESIS  01/23/2018  . IR PARACENTESIS  02/07/2018  . IR PARACENTESIS  02/21/2018  . IR PARACENTESIS  03/06/2018  . IR PARACENTESIS  03/17/2018  . IR PARACENTESIS  04/04/2018  . IR PARACENTESIS  12/28/2018  . IR PARACENTESIS  01/08/2019  . IR PARACENTESIS  01/23/2019  . IR PARACENTESIS  02/01/2019  . IR PARACENTESIS  02/19/2019  . IR PARACENTESIS  03/01/2019  . IR PARACENTESIS  03/15/2019  . IR PARACENTESIS  04/03/2019  . IR PARACENTESIS  04/12/2019  . IR PARACENTESIS  05/01/2019  . IR PARACENTESIS  05/08/2019  . IR PARACENTESIS  05/24/2019  . IR PARACENTESIS  06/12/2019  . IR PARACENTESIS  07/09/2019  . IR PARACENTESIS  07/27/2019  . IR RADIOLOGIST EVAL & MGMT  02/14/2018  . IR RADIOLOGIST EVAL & MGMT  02/22/2019  . LEFT HEART CATH AND CORONARY ANGIOGRAPHY N/A 02/22/2017   Procedure: LEFT HEART CATH AND CORONARY ANGIOGRAPHY;  Surgeon: Nigel Mormon, MD;  Location: [facility];  Service: Cardiovascular;  Laterality: N/A;  . LIGATION OF ARTERIOVENOUS  FISTULA Left 6/0/1093   Procedure: Plication of Left Arm Arteriovenous Fistula;  Surgeon: Elam Dutch, MD;  Location: [facility];  Service: Vascular;  Laterality: Left;  .  POLYPECTOMY    . POLYPECTOMY  01/25/2018   Procedure: POLYPECTOMY;  Surgeon: Jerene Bears, MD;  Location: [facility];  Service: Gastroenterology;;  . REVISON OF ARTERIOVENOUS FISTULA Left 2/35/5732   Procedure: PLICATION OF DISTAL ANEURYSMAL SEGEMENT OF LEFT UPPER ARM ARTERIOVENOUS FISTULA;  Surgeon: Elam Dutch, MD;  Location: [facility];  Service: Vascular;  Laterality: Left;  . REVISON OF ARTERIOVENOUS FISTULA Left 08/13/5425   Procedure: Plication of Left Upper Arm Fistula ;  Surgeon: Waynetta Sandy, MD;  Location: [facility];  Service: Vascular;  Laterality:  Left;  . SKIN GRAFT SPLIT THICKNESS LEG / FOOT Left    SKIN GRAFT SPLIT THICKNESS LEFT ARM DONOR SITE: LEFT ANTERIOR THIGH  . SKIN SPLIT GRAFT Left 07/04/2017   Procedure: SKIN GRAFT SPLIT THICKNESS LEFT ARM DONOR SITE: LEFT ANTERIOR THIGH;  Surgeon: Elam Dutch, MD;  Location: [facility];  Service: Vascular;  Laterality: Left;  . THROMBECTOMY W/ EMBOLECTOMY Left 06/05/2017   Procedure: EXPLORATION OF LEFT ARM FOR BLEEDING; OVERSEWED PROXIMAL FISTULA;  Surgeon: Angelia Mould, MD;  Location: [facility];  Service: Vascular;  Laterality: Left;  . WOUND EXPLORATION Left 06/03/2017   Procedure: WOUND EXPLORATION WITH WOUND VAC APPLICATION TO LEFT ARM;  Surgeon: Angelia Mould, MD;  Location: [facility];  Service: Vascular;  Laterality: Left;       Family History  Problem Relation Age of Onset  . Heart disease Mother   . Lung cancer Mother   . Heart disease Father   . Malignant hyperthermia Father   . COPD Father   . Throat cancer Sister   . Esophageal cancer Sister   . Hypertension Other   . COPD Other   . Colon cancer Neg Hx   . Colon polyps Neg Hx   . Rectal cancer Neg Hx   . Stomach cancer Neg Hx     Social History   Tobacco Use  . Smoking status: Current Every Day Smoker    Packs/day: 0.50    Years: 43.00    Pack years: 21.50    Types: Cigarettes    Start date: 08/13/1973  . Smokeless tobacco: Never  Used  . Tobacco comment: i dont know i just make   Substance Use Topics  . Alcohol use: Not Currently    Comment: quit drinking in 2017  . Drug use: Not Currently    Types: Marijuana, Cocaine    Comment: reports using once every 3 months, 04-06-2019 was this     Home Medications Prior to Admission medications   Medication Sig Start Date End Date Taking? Authorizing Provider  albuterol (VENTOLIN HFA) 108 (90 Base) MCG/ACT inhaler Inhale 2 puffs into the lungs every 6 (six) hours as needed for wheezing or shortness of breath.    [provider]  amiodarone (PACERONE) 200 MG tablet Take 1 tablet (200 mg total) by mouth daily. 07/22/19 08/21/19  Lucrezia Starch, MD  hydrALAZINE (APRESOLINE) 100 MG tablet Take 0.5 tablets (50 mg total) by mouth 3 (three) times daily. 05/31/19   [place], Carley D, DO  lactulose (CHRONULAC) 10 GM/15ML solution TAKE 45 MLS BY MOUTH 2 TIMES DAILY. Patient taking differently: Take 30 g by mouth 2 (two) times daily.  06/04/19   Al Decant, MD  loperamide (IMODIUM A-D) 2 MG tablet Take 2 mg by mouth 4 (four) times daily as needed for diarrhea or loose stools.    [provider]  metoprolol tartrate (LOPRESSOR) 100 MG tablet Take 1 tablet (100 mg total) by mouth 2 (two) times daily. 06/04/19   Al Decant, MD  montelukast (SINGULAIR) 10 MG tablet Take 10 mg by mouth every evening.     [provider]  ondansetron (ZOFRAN) 8 MG tablet Take 8 mg by mouth every 4 (four) hours as needed for nausea.  06/14/19   [provider]  oxyCODONE (OXY IR/ROXICODONE) 5 MG immediate release tablet Take 5 mg by mouth every 6 (six) hours as needed for moderate pain.  07/17/19   [provider]  pantoprazole (PROTONIX) 40 MG tablet Take  1 tablet (40 mg total) by mouth daily. Patient taking differently: Take 40 mg by mouth See admin instructions. Take 40 mg by mouth in the morning before breakfast and an additional 40 mg once a day, if  symptoms persist 06/05/19   Maudie Mercury, MD  sevelamer carbonate (RENVELA) 800 MG tablet Take 4 tablets with meals  And 2 tablets twice daily with snacks Patient taking differently: Take 1,600-2,400 mg by mouth See admin instructions. Take 2,400 mg by mouth three times a day with meals and 1,600 mg with each snack 01/22/18   Patrecia Pour, MD  sodium zirconium cyclosilicate (LOKELMA) 10 g PACK packet Take 10 g by mouth once for 1 dose. 07/29/19 07/29/19  [place]atolo, Adam, DO  sulfamethoxazole-trimethoprim (BACTRIM DS) 800-160 MG tablet Take 1 tablet by mouth daily. 12/30/18   Geradine Girt, DO  SYMBICORT 160-4.5 MCG/ACT inhaler Inhale 2 puffs into the lungs 2 (two) times daily.  11/30/18   [provider]  zolpidem (AMBIEN) 10 MG tablet Take 10 mg by mouth at bedtime as needed for sleep.    [provider]    Allergies    Morphine and related, Aspirin, Clonidine derivatives, Tramadol, and Tylenol [acetaminophen]  Review of Systems   Review of Systems  Constitutional: Negative for fever.  HENT: Negative for sore throat.   Eyes: Negative for visual disturbance.  Respiratory: Positive for shortness of breath. Negative for hemoptysis and sputum production.   Cardiovascular: Negative for chest pain.  Gastrointestinal: Positive for abdominal pain.  Genitourinary: Negative for dysuria.  Musculoskeletal: Negative for neck pain.  Skin: Negative for rash.  Neurological: Negative for headaches.    Physical Exam Updated Vital Signs BP 135/69   Pulse 76   Temp 98.2 F (36.8 C) (Oral)   Resp 16   SpO2 100%   Physical Exam Vitals and nursing note reviewed.  Constitutional:      Appearance: He is well-developed.  HENT:     Head: Normocephalic and atraumatic.  Eyes:     Conjunctiva/sclera: Conjunctivae normal.  Cardiovascular:     Rate and Rhythm: Normal rate and regular rhythm.     Pulses: Normal pulses.     Heart sounds: No murmur.  Pulmonary:     Effort: Pulmonary  effort is normal. No respiratory distress.     Breath sounds: Normal breath sounds.  Abdominal:     General: There is distension.     Tenderness: There is no abdominal tenderness. There is no guarding or rebound.  Musculoskeletal:        General: Normal range of motion.     Cervical back: Neck supple.     Right lower leg: No edema.     Left lower leg: No edema.  Skin:    General: Skin is warm and dry.     Capillary Refill: Capillary refill takes less than 2 seconds.  Neurological:     General: No focal deficit present.     Mental Status: He is alert.     ED Results / Procedures / Treatments   Labs (all labs ordered are listed, but only abnormal results are displayed) Labs Reviewed  BASIC METABOLIC PANEL - Abnormal; Notable for the following components:      Result Value   Potassium 6.8 (*)    Chloride 97 (*)    BUN 34 (*)    Creatinine, Ser 7.71 (*)    GFR calc non Af Amer 7 (*)    GFR calc Af Amer 8 (*)  All other components within normal limits  CBC - Abnormal; Notable for the following components:   RBC 3.75 (*)    Hemoglobin 10.2 (*)    HCT 29.9 (*)    MCV 79.7 (*)    Platelets 131 (*)    All other components within normal limits  HEPATITIS B SURFACE ANTIGEN - Abnormal; Notable for the following components:   Hepatitis B Surface Ag Reactive (*)    All other components within normal limits  CBG MONITORING, ED - Abnormal; Notable for the following components:   Glucose-Capillary 27 (*)    All other components within normal limits  CBG MONITORING, ED - Abnormal; Notable for the following components:   Glucose-Capillary 64 (*)    All other components within normal limits  CBG MONITORING, ED - Abnormal; Notable for the following components:   Glucose-Capillary 114 (*)    All other components within normal limits  TROPONIN I (HIGH SENSITIVITY) - Abnormal; Notable for the following components:   Troponin I (High Sensitivity) 61 (*)    All other components within  normal limits  TROPONIN I (HIGH SENSITIVITY) - Abnormal; Notable for the following components:   Troponin I (High Sensitivity) 57 (*)    All other components within normal limits  RESPIRATORY PANEL BY RT PCR (FLU A&B, COVID)    EKG EKG Interpretation  Date/Time:  Sunday July 29 2019 16:16:03 EST Ventricular Rate:  87 PR Interval:    QRS Duration: 180 QT Interval:  466 QTC Calculation: 560 R Axis:   -78 Text Interpretation: Atrial fibrillation Left axis deviation Left bundle branch block Abnormal ECG similar to prior today Confirmed by Aletta Edouard [phone]) on 07/29/2019 4:41:53 PM   Radiology DG Chest Port 1 View  Result Date: 07/29/2019 CLINICAL DATA:  Ventricular tachycardia EXAM: PORTABLE CHEST 1 VIEW COMPARISON:  07/23/2019 FINDINGS: Enlarged cardiac silhouette similar prior. Mild central venous congestion. No effusion, infiltrate pneumothorax. IMPRESSION: Cardiomegaly and mild venous congestion. Electronically Signed   By: Suzy Bouchard M.D.   On: 07/29/2019 05:58    Procedures .Critical Care Performed by: Hayden Rasmussen, MD Authorized by: Hayden Rasmussen, MD   Critical care provider statement:    Critical care time (minutes):  45   Critical care time was exclusive of:  Separately billable procedures and treating other patients   Critical care was necessary to treat or prevent imminent or life-threatening deterioration of the following conditions:  Metabolic crisis   Critical care was time spent personally by me on the following activities:  Discussions with consultants, evaluation of patient's response to treatment, examination of patient, ordering and performing treatments and interventions, ordering and review of laboratory studies, ordering and review of radiographic studies, pulse oximetry, re-evaluation of patient's condition, obtaining history from patient or surrogate, review of old charts and development of treatment plan with patient or surrogate   I  assumed direction of critical care for this patient from another provider in my specialty: no     (including critical care time)  Medications Ordered in ED Medications  calcium gluconate 1 g/ 50 mL sodium chloride IVPB (1,000 mg Intravenous New Bag/Given 07/29/19 1813)  sodium chloride flush (NS) 0.9 % injection 3 mL (3 mLs Intravenous Given 07/29/19 1812)  HYDROmorphone (DILAUDID) injection 1 mg (1 mg Intravenous Given 07/29/19 1812)  ondansetron (ZOFRAN) injection 4 mg (4 mg Intravenous Given 07/29/19 1812)  insulin aspart (novoLOG) injection 5 Units (5 Units Intravenous Given 07/29/19 1811)    And  dextrose 50 %  solution 50 mL (50 mLs Intravenous Given 07/29/19 1813)  sodium zirconium cyclosilicate (LOKELMA) packet 10 g (10 g Oral Given 07/29/19 1817)    ED Course  I have reviewed the triage vital signs and the nursing notes.  Pertinent labs & imaging results that were available during my care of the patient were reviewed by me and considered in my medical decision making (see chart for details).  Clinical Course as of Jul 29 1099  Sun Jul 28, 4370  6967 59 year old history of cirrhosis end-stage renal disease here with shortness of breath abdominal pain nausea.  Well-appearing normal vitals.  Abnormal EKG but close to baseline.  Discharged earlier this morning.  Differential includes ascites, CHF, metabolic derangement   [MB]  1710 On review of prior records he was actually had his paracentesis 2 days ago.   [MB]  5997 Discussed with Dr. Jonnie Finner about the patient's potassium is 6.8.  He said to go ahead and give him treatment although he likely will dialyze him tonight and he can be discharged afterwards.   [MB]  1920 Informed that the patient's blood sugar has dropped to 27.  Ordered an amp of D50.  This is likely in response to the hyperkalemia treatment he received.  We will continue to monitor.   [MB]  2111 Delta troponin essentially unchanged.  Awaiting dialysis.   [MB]      Clinical Course User Index [MB] Hayden Rasmussen, MD   MDM Rules/Calculators/A&P                       Final Clinical Impression(s) / ED Diagnoses Final diagnoses:  Acute hyperkalemia  SOB (shortness of breath)  Ascites of liver    Rx / DC Orders ED Discharge Orders    None       Hayden Rasmussen, MD 07/30/19 1103

## 2019-07-29 NOTE — Procedures (Signed)
Pt returned to ED now w/ K+ 6.8 and still SOB.  Exam is unchanged from this am.  Got IV Ca/ ins/ glu in ED and lokelma. Asked to see for dialysis.  Pt agrees now to HD this evening. Plan is for HD this evening upstairs w/ low K bath to normalize K and get volume down some, and then will dc home after HD. Have d/w ED MD Dr Melina Copa.    I was present at this dialysis session, have reviewed the session itself and made  appropriate changes [name] MD [place] pager [phone]   07/29/2019, 10:52 PM

## 2019-07-29 NOTE — ED Provider Notes (Signed)
[facility] EMERGENCY DEPARTMENT Provider Note   CSN: [id number] Arrival date & time: 07/29/19  0458     History Chief Complaint  Patient presents with  . Tanna Furry    Beren Yniguez is a 59 y.o. male.  The history is provided by the patient.  Shortness of Breath Severity:  Severe Onset quality:  Sudden Timing:  Constant Progression:  Worsening Chronicity:  New Relieved by:  Nothing Worsened by:  Nothing Associated symptoms: abdominal pain   Associated symptoms: no chest pain and no fever   Patient with extensive medical history including ESRD, COPD, CAD presents with abdominal pain or shortness of breath.  Patient reports his abdominal pain is very frequent and he had a recent paracentesis.  He reports the shortness of breath began tonight.  EMS was called and they felt the patient was in ventricular tachycardia and was given amiodarone.  He has since converted to rate controlled atrial fibrillation.  Patient denies any recent missed dialysis sessions but did leave early     Past Medical History:  Diagnosis Date  . Anemia   . Anxiety   . Arthritis    left shoulder  . Atherosclerosis of aorta ([place])   . Cardiomegaly   . Chest pain    DATE UNKNOWN, C/O PERIODICALLY  . Cocaine abuse ([place])   . COPD exacerbation ([place]) 08/17/2016  . Coronary artery disease    stent 02/22/17  . ESRD (end stage renal disease) on dialysis ([place])    "[name]; MWF" (07/04/2017)  . GERD (gastroesophageal reflux disease)    DATE UNKNOWN  . Hemorrhoids   . Hepatitis B, chronic ([place])   . Hepatitis C   . History of kidney stones   . Hyperkalemia   . Hypertension   . Kidney failure   . Metabolic bone disease    Patient denies  . Mitral stenosis   . Myocardial infarction ([place])   . Pneumonia   . Pulmonary edema   . Solitary rectal ulcer syndrome 07/2017   at [facility] for rectal bleeding  . Tubular adenoma of colon     Patient Active Problem List   Diagnosis Date  Noted  . Dyspnea 06/09/2019  . Acquired thrombophilia ([place]) 06/05/2019  . A-fib ([place]) 05/30/2019  . Atrial fibrillation with RVR ([place]) 05/29/2019  . Melena   . Pressure injury of skin 03/09/2019  . Abdominal distention   . Volume overload 12/28/2018  . Sepsis ([place]) 09/12/2018  . Atherosclerosis of native coronary artery of native heart without angina pectoris 03/11/2018  . Benign neoplasm of cecum   . Benign neoplasm of ascending colon   . Benign neoplasm of descending colon   . Benign neoplasm of rectum   . Paroxysmal atrial fibrillation ([place]) 01/23/2018  . Hx of colonic polyps 01/20/2018  . ESRD (end stage renal disease) ([place]) 11/21/2017  . GERD (gastroesophageal reflux disease) 11/16/2017  . Liver cirrhosis ([place]) 11/15/2017  . DNR (do not resuscitate)   . Palliative care by specialist   . Hyponatremia 11/04/2017  . SBP (spontaneous bacterial peritonitis) ([place]) 10/30/2017  . Liver disease, chronic 10/30/2017  . SOB (shortness of breath)   . Abdominal pain 10/28/2017  . Upper airway cough syndrome with flattening on f/v loop 10/13/17 c/w vcd 10/17/2017  . Elevated diaphragm 10/13/2017  . Ileus ([place]) 09/29/2017  . QT prolongation 09/29/2017  . Malnutrition of moderate degree 09/29/2017  . Sinus congestion 09/03/2017  . Symptomatic anemia 09/02/2017  . Other cirrhosis of  liver ([place]) 09/02/2017  . Left bundle branch block 09/02/2017  . Mitral stenosis 09/02/2017  . Hematochezia 07/15/2017  . Wide-complex tachycardia ([place])   . Endotracheally intubated   . ESRD on dialysis ([place]) 07/04/2017  . Acute respiratory failure with hypoxia ([place]) 06/18/2017  . CKD (chronic kidney disease) stage V requiring chronic dialysis ([place]) 06/18/2017  . History of Cocaine abuse ([place]) 06/18/2017  . Hypertension 06/18/2017  . Infection of AV graft for dialysis ([place]) 06/18/2017  . Anxiety 06/18/2017  . Anemia due to chronic kidney disease 06/18/2017  . Atypical atrial flutter ([place]) 06/18/2017  .  Personality disorder ([place]) 06/13/2017  . Cellulitis 06/12/2017  . Adjustment disorder with mixed anxiety and depressed mood 06/10/2017  . Suicidal ideation 06/10/2017  . Arm wound, left, sequela 06/10/2017  . Dyspnea on exertion 05/29/2017  . Tachycardia 05/29/2017  . Hyperkalemia 05/22/2017  . Acute metabolic encephalopathy   . Anemia 04/23/2017  . Ascites 04/23/2017  . COPD (chronic obstructive pulmonary disease) ([place]) 04/23/2017  . Acute on chronic respiratory failure with hypoxia ([place]) 03/25/2017  . Arrhythmia 03/25/2017  . COPD GOLD 0 with flattening on inps f/v  09/27/2016  . Essential hypertension 09/27/2016  . Fluid overload 08/30/2016  . COPD exacerbation ([place]) 08/17/2016  . Hypertensive urgency 08/17/2016  . Respiratory failure ([place]) 08/17/2016  . Problem with dialysis access ([place]) 07/23/2016  . Chronic hepatitis B ([place]) 03/05/2014  . Chronic hepatitis C without hepatic coma ([place]) 03/05/2014  . Internal hemorrhoids with bleeding, swelling and itching 03/05/2014  . Thrombocytopenia ([place]) 03/05/2014  . Chest pain 02/27/2014  . Alcohol abuse 04/14/2009  . Cigarette smoker 04/14/2009  . GANGLION CYST 04/14/2009    Past Surgical History:  Procedure Laterality Date  . A/V FISTULAGRAM Left 05/26/2017   Procedure: A/V FISTULAGRAM;  Surgeon: Conrad [place]illasville, MD;  Location: [facility];  Service: Cardiovascular;  Laterality: Left;  . A/V FISTULAGRAM Right 11/18/2017   Procedure: A/V FISTULAGRAM - Right Arm;  Surgeon: Elam Dutch, MD;  Location: [facility];  Service: Cardiovascular;  Laterality: Right;  . APPLICATION OF WOUND VAC Left 06/14/2017   Procedure: APPLICATION OF WOUND VAC;  Surgeon: Katha Cabal, MD;  Location: [facility];  Service: Vascular;  Laterality: Left;  . AV FISTULA PLACEMENT  2012   BELIEVED WAS PLACED IN JUNE  . AV FISTULA PLACEMENT Right 08/09/2017   Procedure: Creation Right arm ARTERIOVENOUS BRACHIOCEPOHALIC FISTULA;  Surgeon:  Elam Dutch, MD;  Location: [facility];  Service: Vascular;  Laterality: Right;  . AV FISTULA PLACEMENT Right 11/22/2017   Procedure: INSERTION OF ARTERIOVENOUS (AV) GORE-TEX GRAFT RIGHT UPPER ARM;  Surgeon: Elam Dutch, MD;  Location: [facility];  Service: Vascular;  Laterality: Right;  . BIOPSY  01/25/2018   Procedure: BIOPSY;  Surgeon: Jerene Bears, MD;  Location: [facility];  Service: Gastroenterology;;  . BIOPSY  04/10/2019   Procedure: BIOPSY;  Surgeon: Jerene Bears, MD;  Location: [facility];  Service: Gastroenterology;;  . COLONOSCOPY    . COLONOSCOPY WITH PROPOFOL N/A 01/25/2018   Procedure: COLONOSCOPY WITH PROPOFOL;  Surgeon: Jerene Bears, MD;  Location: [facility];  Service: Gastroenterology;  Laterality: N/A;  . CORONARY STENT INTERVENTION N/A 02/22/2017   Procedure: CORONARY STENT INTERVENTION;  Surgeon: Nigel Mormon, MD;  Location: [facility];  Service: Cardiovascular;  Laterality: N/A;  . ESOPHAGOGASTRODUODENOSCOPY (EGD) WITH PROPOFOL N/A 01/25/2018   Procedure: ESOPHAGOGASTRODUODENOSCOPY (EGD) WITH PROPOFOL;  Surgeon: Jerene Bears, MD;  Location: [facility];  Service: Gastroenterology;  Laterality: N/A;  . ESOPHAGOGASTRODUODENOSCOPY (EGD) WITH PROPOFOL N/A 04/10/2019   Procedure: ESOPHAGOGASTRODUODENOSCOPY (EGD) WITH PROPOFOL;  Surgeon: Jerene Bears, MD;  Location: [facility];  Service: Gastroenterology;  Laterality: N/A;  . FLEXIBLE SIGMOIDOSCOPY N/A 07/15/2017   Procedure: FLEXIBLE SIGMOIDOSCOPY;  Surgeon: Carol Ada, MD;  Location: [facility];  Service: Endoscopy;  Laterality: N/A;  . HEMORRHOID BANDING    . I & D EXTREMITY Left 06/01/2017   Procedure: IRRIGATION AND DEBRIDEMENT LEFT ARM HEMATOMA WITH LIGATION OF LEFT ARM AV FISTULA;  Surgeon: Elam Dutch, MD;  Location: [facility];  Service: Vascular;  Laterality: Left;  . I & D EXTREMITY Left 06/14/2017   Procedure: IRRIGATION AND DEBRIDEMENT EXTREMITY;  Surgeon: Katha Cabal, MD;   Location: [facility];  Service: Vascular;  Laterality: Left;  . INSERTION OF DIALYSIS CATHETER  05/30/2017  . INSERTION OF DIALYSIS CATHETER N/A 05/30/2017   Procedure: INSERTION OF DIALYSIS CATHETER;  Surgeon: Elam Dutch, MD;  Location: [facility];  Service: Vascular;  Laterality: N/A;  . IR PARACENTESIS  08/30/2017  . IR PARACENTESIS  09/29/2017  . IR PARACENTESIS  10/28/2017  . IR PARACENTESIS  11/09/2017  . IR PARACENTESIS  11/16/2017  . IR PARACENTESIS  11/28/2017  . IR PARACENTESIS  12/01/2017  . IR PARACENTESIS  12/06/2017  . IR PARACENTESIS  01/03/2018  . IR PARACENTESIS  01/23/2018  . IR PARACENTESIS  02/07/2018  . IR PARACENTESIS  02/21/2018  . IR PARACENTESIS  03/06/2018  . IR PARACENTESIS  03/17/2018  . IR PARACENTESIS  04/04/2018  . IR PARACENTESIS  12/28/2018  . IR PARACENTESIS  01/08/2019  . IR PARACENTESIS  01/23/2019  . IR PARACENTESIS  02/01/2019  . IR PARACENTESIS  02/19/2019  . IR PARACENTESIS  03/01/2019  . IR PARACENTESIS  03/15/2019  . IR PARACENTESIS  04/03/2019  . IR PARACENTESIS  04/12/2019  . IR PARACENTESIS  05/01/2019  . IR PARACENTESIS  05/08/2019  . IR PARACENTESIS  05/24/2019  . IR PARACENTESIS  06/12/2019  . IR PARACENTESIS  07/09/2019  . IR PARACENTESIS  07/27/2019  . IR RADIOLOGIST EVAL & MGMT  02/14/2018  . IR RADIOLOGIST EVAL & MGMT  02/22/2019  . LEFT HEART CATH AND CORONARY ANGIOGRAPHY N/A 02/22/2017   Procedure: LEFT HEART CATH AND CORONARY ANGIOGRAPHY;  Surgeon: Nigel Mormon, MD;  Location: [facility];  Service: Cardiovascular;  Laterality: N/A;  . LIGATION OF ARTERIOVENOUS  FISTULA Left 07/20/1476   Procedure: Plication of Left Arm Arteriovenous Fistula;  Surgeon: Elam Dutch, MD;  Location: [facility];  Service: Vascular;  Laterality: Left;  . POLYPECTOMY    . POLYPECTOMY  01/25/2018   Procedure: POLYPECTOMY;  Surgeon: Jerene Bears, MD;  Location: [facility];  Service: Gastroenterology;;  . REVISON OF ARTERIOVENOUS FISTULA Left 2/95/6213    Procedure: PLICATION OF DISTAL ANEURYSMAL SEGEMENT OF LEFT UPPER ARM ARTERIOVENOUS FISTULA;  Surgeon: Elam Dutch, MD;  Location: [facility];  Service: Vascular;  Laterality: Left;  . REVISON OF ARTERIOVENOUS FISTULA Left 0/86/5784   Procedure: Plication of Left Upper Arm Fistula ;  Surgeon: Waynetta Sandy, MD;  Location: [facility];  Service: Vascular;  Laterality: Left;  . SKIN GRAFT SPLIT THICKNESS LEG / FOOT Left    SKIN GRAFT SPLIT THICKNESS LEFT ARM DONOR SITE: LEFT ANTERIOR THIGH  . SKIN SPLIT GRAFT Left 07/04/2017   Procedure: SKIN GRAFT SPLIT THICKNESS LEFT ARM DONOR SITE: LEFT ANTERIOR THIGH;  Surgeon: Oneida Alar,  Jessy Oto, MD;  Location: [facility];  Service: Vascular;  Laterality: Left;  . THROMBECTOMY W/ EMBOLECTOMY Left 06/05/2017   Procedure: EXPLORATION OF LEFT ARM FOR BLEEDING; OVERSEWED PROXIMAL FISTULA;  Surgeon: Angelia Mould, MD;  Location: [facility];  Service: Vascular;  Laterality: Left;  . WOUND EXPLORATION Left 06/03/2017   Procedure: WOUND EXPLORATION WITH WOUND VAC APPLICATION TO LEFT ARM;  Surgeon: Angelia Mould, MD;  Location: [facility];  Service: Vascular;  Laterality: Left;       Family History  Problem Relation Age of Onset  . Heart disease Mother   . Lung cancer Mother   . Heart disease Father   . Malignant hyperthermia Father   . COPD Father   . Throat cancer Sister   . Esophageal cancer Sister   . Hypertension Other   . COPD Other   . Colon cancer Neg Hx   . Colon polyps Neg Hx   . Rectal cancer Neg Hx   . Stomach cancer Neg Hx     Social History   Tobacco Use  . Smoking status: Current Every Day Smoker    Packs/day: 0.50    Years: 43.00    Pack years: 21.50    Types: Cigarettes    Start date: 08/13/1973  . Smokeless tobacco: Never Used  . Tobacco comment: i dont know i just make   Substance Use Topics  . Alcohol use: Not Currently    Comment: quit drinking in 2017  . Drug use: Not Currently    Types: Marijuana, Cocaine     Comment: reports using once every 3 months, 04-06-2019 was this     Home Medications Prior to Admission medications   Medication Sig Start Date End Date Taking? Authorizing Provider  albuterol (VENTOLIN HFA) 108 (90 Base) MCG/ACT inhaler Inhale 2 puffs into the lungs every 6 (six) hours as needed for wheezing or shortness of breath.    [provider]  amiodarone (PACERONE) 200 MG tablet Take 1 tablet (200 mg total) by mouth daily. 07/22/19 08/21/19  Lucrezia Starch, MD  hydrALAZINE (APRESOLINE) 100 MG tablet Take 0.5 tablets (50 mg total) by mouth 3 (three) times daily. 05/31/19   [place]omfield, Carley D, DO  lactulose (CHRONULAC) 10 GM/15ML solution TAKE 45 MLS BY MOUTH 2 TIMES DAILY. Patient taking differently: Take 30 g by mouth 2 (two) times daily.  06/04/19   Al Decant, MD  loperamide (IMODIUM A-D) 2 MG tablet Take 2 mg by mouth 4 (four) times daily as needed for diarrhea or loose stools.    [provider]  metoprolol tartrate (LOPRESSOR) 100 MG tablet Take 1 tablet (100 mg total) by mouth 2 (two) times daily. 06/04/19   Al Decant, MD  montelukast (SINGULAIR) 10 MG tablet Take 10 mg by mouth every evening.     [provider]  ondansetron (ZOFRAN) 8 MG tablet Take 8 mg by mouth every 4 (four) hours as needed for nausea.  06/14/19   [provider]  oxyCODONE (OXY IR/ROXICODONE) 5 MG immediate release tablet Take 5 mg by mouth every 6 (six) hours as needed for moderate pain.  07/17/19   [provider]  pantoprazole (PROTONIX) 40 MG tablet Take 1 tablet (40 mg total) by mouth daily. Patient taking differently: Take 40 mg by mouth See admin instructions. Take 40 mg by mouth in the morning before breakfast and an additional 40 mg once a day, if symptoms persist 06/05/19   Maudie Mercury, MD  sevelamer carbonate (  RENVELA) 800 MG tablet Take 4 tablets with meals  And 2 tablets twice daily with snacks Patient taking differently: Take 1,600-2,400 mg  by mouth See admin instructions. Take 2,400 mg by mouth three times a day with meals and 1,600 mg with each snack 01/22/18   Patrecia Pour, MD  sulfamethoxazole-trimethoprim (BACTRIM DS) 800-160 MG tablet Take 1 tablet by mouth daily. 12/30/18   Geradine Girt, DO  SYMBICORT 160-4.5 MCG/ACT inhaler Inhale 2 puffs into the lungs 2 (two) times daily.  11/30/18   [provider]  zolpidem (AMBIEN) 10 MG tablet Take 10 mg by mouth at bedtime as needed for sleep.    [provider]    Allergies    Morphine and related, Aspirin, Clonidine derivatives, Tramadol, and Tylenol [acetaminophen]  Review of Systems   Review of Systems  Constitutional: Negative for fever.  Respiratory: Positive for shortness of breath.   Cardiovascular: Negative for chest pain.  Gastrointestinal: Positive for abdominal pain.  All other systems reviewed and are negative.   Physical Exam Updated Vital Signs BP (!) 149/70   Pulse 79   Temp 97.8 F (36.6 C) (Oral)   Resp (!) 23   SpO2 100%   Physical Exam CONSTITUTIONAL: Chronically ill-appearing HEAD: Normocephalic/atraumatic EYES: EOMI ENMT: Poor dentition NECK: supple no meningeal signs SPINE/BACK:entire spine nontender CV: Irregular LUNGS: Lungs are clear to auscultation bilaterally, no apparent distress ABDOMEN: soft, distended but nontender, no rebound or guarding GU:no cva tenderness NEURO: Pt is awake/alert/appropriate, moves all extremitiesx4.  No facial droop.   EXTREMITIES: pulses normal/equal, full ROM, dialysis access to right arm SKIN: warm, color normal PSYCH: no abnormalities of mood noted, alert and oriented to situation  ED Results / Procedures / Treatments   Labs (all labs ordered are listed, but only abnormal results are displayed) Labs Reviewed  POCT I-STAT EG7 - Abnormal; Notable for the following components:      Result Value   pH, Ven 7.583 (*)    pCO2, Ven 34.9 (*)    pO2, Ven 134.0 (*)    Bicarbonate 32.9 (*)     TCO2 34 (*)    Acid-Base Excess 10.0 (*)    Sodium 134 (*)    Potassium 6.2 (*)    Calcium, Ion 1.10 (*)    HCT 30.0 (*)    Hemoglobin 10.2 (*)    All other components within normal limits  BASIC METABOLIC PANEL  CBC WITH DIFFERENTIAL/PLATELET  TROPONIN I (HIGH SENSITIVITY)    EKG EKG Interpretation  Date/Time:  Sunday July 29 2019 05:05:00 EST Ventricular Rate:  93 PR Interval:    QRS Duration: 175 QT Interval:  455 QTC Calculation: 522 R Axis:   -94 Text Interpretation: Atrial fibrillation Paired ventricular premature complexes Short PR interval Nonspecific IVCD with LAD Consider left ventricular hypertrophy Anterior Q waves, possibly due to LVH Baseline wander in lead(s) V4 Interpretation limited secondary to artifact Confirmed by Ripley Fraise [phone]) on 07/29/2019 5:22:30 AM   Radiology DG Chest Port 1 View  Result Date: 07/29/2019 CLINICAL DATA:  Ventricular tachycardia EXAM: PORTABLE CHEST 1 VIEW COMPARISON:  07/23/2019 FINDINGS: Enlarged cardiac silhouette similar prior. Mild central venous congestion. No effusion, infiltrate pneumothorax. IMPRESSION: Cardiomegaly and mild venous congestion. Electronically Signed   By: Suzy Bouchard M.D.   On: 07/29/2019 05:58   IR Paracentesis  Result Date: 07/27/2019 INDICATION: Patient with history of hepatitis-C, cirrhosis, end-stage renal disease on hemodialysis and recurrent ascites. Request IR for diagnostic and therapeutic paracentesis. EXAM:  ULTRASOUND GUIDED DIAGNOSTIC AND THERAPEUTIC PARACENTESIS MEDICATIONS: 10 mL 1% lidocaine COMPLICATIONS: None immediate. PROCEDURE: Informed written consent was obtained from the patient after a discussion of the risks, benefits and alternatives to treatment. A timeout was performed prior to the initiation of the procedure. Initial ultrasound scanning demonstrates a moderate amount of ascites within the left lower abdominal quadrant. The left lower abdomen was prepped and draped in  the usual sterile fashion. 1% lidocaine was used for local anesthesia. Following this, a 19 gauge, 7-cm, Yueh catheter was introduced. An ultrasound image was saved for documentation purposes. The paracentesis was performed. The catheter was removed and a dressing was applied. The patient tolerated the procedure well without immediate post procedural complication. FINDINGS: A total of approximately 3.2 L of serosanguineous fluid was removed. Samples were sent to the laboratory as requested by the clinical team. IMPRESSION: Successful ultrasound-guided paracentesis yielding 3.2 liters of peritoneal fluid. Read by Candiss Norse, PA-C Electronically Signed   By: Jacqulynn Cadet M.D.   On: 07/27/2019 13:50    Procedures Procedures   Medications Ordered in ED Medications  HYDROmorphone (DILAUDID) injection 1 mg (1 mg Intravenous Given 07/29/19 0515)    ED Course  I have reviewed the triage vital signs and the nursing notes.  Pertinent labs & imaging results that were available during my care of the patient were reviewed by me and considered in my medical decision making (see chart for details).    MDM Rules/Calculators/A&P                      5:26 AM PT Well-known to the ER with 31 ER visits in 6 months Patient came in today for shortness of breath.  EMS prehospital EKG revealed a wide-complex tachycardia, he was treated for ventricular tachycardia with amiodarone bolus and drip. He has since converted to rate controlled atrial fibrillation He has been seen for this previously and is on amiodarone at home.  Previous episodes was felt to be due to A. fib with aberrancy rather than V. Tach Currently is awake alert and appropriate.  Patient is stable.  Will obtain x-ray and labs and call his cardiologist 6:10 AM Discussed with Dr. Virgina Jock with cardiology He knows this patient well.  He reports the patient actually has runs of A. fib with aberrancy rather than true ventricular  tachycardia.  Patient is compliant with his amiodarone typically he does not have these issues.  Patient has difficulty keeping compliant to being seen as an outpatient. No further intervention advised at this point. 7:15 AM D/w dr Jonnie Finner with nephrology to see if patient should get HD He will call back with final plan Signed out to dr. Ronnald Nian with dispo pending Pt is in no distress at this time, watching TV Final Clinical Impression(s) / ED Diagnoses Final diagnoses:  Permanent atrial fibrillation ([place])  ESRD (end stage renal disease) ([place])  Chronic abdominal pain    Rx / DC Orders ED Discharge Orders    None       Ripley Fraise, MD 07/29/19 0715

## 2019-07-29 NOTE — ED Notes (Deleted)
Assumed care pt at this time. Pt alert, resting on cart in NAD. Breathing easy, non-labored.  Call light within reach

## 2019-07-29 NOTE — Discharge Instructions (Addendum)
Take your dose of Lokelma tonight at 7 PM.  Go to dialysis tomorrow.  Return to the ED if your symptoms worsen.

## 2019-07-29 NOTE — ED Triage Notes (Signed)
Patient from home with Vtach.  Patient has converted to Afib with PVCs.  Patient was given 150mg  Amiodarone en route to ED.  Patient is dialysis patient MWF and left an hour early from dialysis.

## 2019-07-29 NOTE — ED Notes (Signed)
Ginger ale provided to pt.

## 2019-07-29 NOTE — ED Notes (Signed)
Patient verbalizes understanding of discharge instructions. Opportunity for questioning and answers were provided. Armband removed by staff, pt discharged from ED.  

## 2019-07-29 NOTE — Progress Notes (Signed)
Asked to see patient for K 6.2 and ? Early chf by CXR.  Pt here for abd pain as usual w/ [name].  On exam no edema , mod ascites and clear lungs. CXR looks at baseline w/o convincing edema. Pt declines considering inpt HD today, will go his center for HD tomorrow. Would give Rx for lokelma 10 gm or kayexalate 30 gm once tonight at home.  Please call w/ questions.   [name], MD 07/29/2019, 9:12 AM

## 2019-07-29 NOTE — ED Provider Notes (Signed)
Patient signed out to me at 7 AM.  Mildly hyperkalemic at 6.2.  Given Lokelma.  Awaiting dialysis plan.  Patient was offered dialysis today but wants to go tomorrow.  He understands the risks and benefits.  Was given a prescription for [facility] for dose tonight.  Understands that he can return to the ED if he chooses to have dialysis later.  This chart was dictated using voice recognition software.  Despite best efforts to proofread,  errors can occur which can change the documentation meaning.     Lennice Sites, DO 07/29/19 [phone]

## 2019-07-29 NOTE — ED Triage Notes (Signed)
Pt bib ems from home with reports of shob. Pt on scene wide complex tachycardia at rate of 130. Denies cp and NV. Self converted to rate of 70, still wide complex. Pt still endorsing shob. Seen earlier today and discharged after being in vtach and afib rvr. 146/78 HR 74 CBG 71 RR 20 98% on NRB

## 2019-07-30 ENCOUNTER — Telehealth: Payer: Self-pay | Admitting: Gastroenterology

## 2019-07-30 NOTE — ED Provider Notes (Signed)
Sent down from HD after completed session. No longer SOB.  The patient is safe for discharge with strict return precautions.     Fatima Blank, MD 07/30/19 [phone]

## 2019-07-31 LAB — HEPATITIS B SURFACE ANTIGEN: Hepatitis B Surface Ag: REACTIVE — AB

## 2019-07-31 LAB — PATHOLOGIST SMEAR REVIEW

## 2019-07-31 NOTE — Telephone Encounter (Signed)
Please advice him to take a MiraLax bowel purge to try and induce adequate BM 16 doses of MiraLax in 64 oz of Gatorade, drink this over 3-4 hours until good BM Once he is clear, begin Miralax 17g daily

## 2019-07-31 NOTE — Telephone Encounter (Signed)
Patient is returning your call.  

## 2019-07-31 NOTE — Telephone Encounter (Signed)
The pt states that he can not take lactulose it makes him vomit.  He states he has not taken it in several weeks and "my colon is full".  He has an appt 1/29 with Dr Hilarie Fredrickson. He says he can not wait until then.  Dr Hilarie Fredrickson you have a banding spot for 1/22 do you want me to put him in that spot?

## 2019-07-31 NOTE — Telephone Encounter (Signed)
The pt has been given the instructions and verbalized understanding.  He will begin 17 g daily after the purge.

## 2019-07-31 NOTE — Telephone Encounter (Signed)
Left message on machine to call back looks like the pt was seen in the ED on 1/17.  I will await further communication from the pt.

## 2019-08-01 ENCOUNTER — Other Ambulatory Visit: Payer: Self-pay

## 2019-08-01 ENCOUNTER — Emergency Department (HOSPITAL_COMMUNITY): Payer: Medicare Other

## 2019-08-01 ENCOUNTER — Encounter: Payer: Self-pay | Admitting: Cardiology

## 2019-08-01 ENCOUNTER — Emergency Department (HOSPITAL_COMMUNITY)
Admission: EM | Admit: 2019-08-01 | Discharge: 2019-08-01 | Disposition: A | Discharge status: Home / Self Care | Payer: Medicare Other | Attending: Emergency Medicine | Admitting: Emergency Medicine

## 2019-08-01 ENCOUNTER — Encounter (HOSPITAL_COMMUNITY): Payer: Self-pay

## 2019-08-01 ENCOUNTER — Non-Acute Institutional Stay (HOSPITAL_COMMUNITY)
Admission: EM | Admit: 2019-08-01 | Discharge: 2019-08-02 | Disposition: A | Discharge status: Home / Self Care | Payer: Medicare Other | Source: Home / Self Care | Attending: Emergency Medicine | Admitting: Emergency Medicine

## 2019-08-01 ENCOUNTER — Ambulatory Visit (INDEPENDENT_AMBULATORY_CARE_PROVIDER_SITE_OTHER): Payer: Medicare Other | Admitting: Cardiology

## 2019-08-01 VITALS — BP 177/95 | HR 66 | Temp 97.0°F | Ht 70.0 in | Wt 151.6 lb

## 2019-08-01 DIAGNOSIS — E875 Hyperkalemia: Secondary | ICD-10-CM | POA: Insufficient documentation

## 2019-08-01 DIAGNOSIS — N186 End stage renal disease: Secondary | ICD-10-CM | POA: Insufficient documentation

## 2019-08-01 DIAGNOSIS — G8929 Other chronic pain: Secondary | ICD-10-CM

## 2019-08-01 DIAGNOSIS — Z992 Dependence on renal dialysis: Secondary | ICD-10-CM | POA: Diagnosis not present

## 2019-08-01 DIAGNOSIS — I2510 Atherosclerotic heart disease of native coronary artery without angina pectoris: Secondary | ICD-10-CM | POA: Diagnosis not present

## 2019-08-01 DIAGNOSIS — I10 Essential (primary) hypertension: Secondary | ICD-10-CM

## 2019-08-01 DIAGNOSIS — R109 Unspecified abdominal pain: Secondary | ICD-10-CM | POA: Insufficient documentation

## 2019-08-01 DIAGNOSIS — I4891 Unspecified atrial fibrillation: Secondary | ICD-10-CM

## 2019-08-01 DIAGNOSIS — F17210 Nicotine dependence, cigarettes, uncomplicated: Secondary | ICD-10-CM | POA: Insufficient documentation

## 2019-08-01 DIAGNOSIS — J449 Chronic obstructive pulmonary disease, unspecified: Secondary | ICD-10-CM | POA: Insufficient documentation

## 2019-08-01 DIAGNOSIS — Z79899 Other long term (current) drug therapy: Secondary | ICD-10-CM | POA: Diagnosis not present

## 2019-08-01 DIAGNOSIS — R000 Tachycardia, unspecified: Secondary | ICD-10-CM | POA: Diagnosis present

## 2019-08-01 DIAGNOSIS — F17200 Nicotine dependence, unspecified, uncomplicated: Secondary | ICD-10-CM

## 2019-08-01 DIAGNOSIS — I120 Hypertensive chronic kidney disease with stage 5 chronic kidney disease or end stage renal disease: Secondary | ICD-10-CM | POA: Diagnosis not present

## 2019-08-01 LAB — CBC
HCT: 28.6 % — ABNORMAL LOW (ref 39.0–52.0)
Hemoglobin: 10 g/dL — ABNORMAL LOW (ref 13.0–17.0)
MCH: 27.9 pg (ref 26.0–34.0)
MCHC: 35 g/dL (ref 30.0–36.0)
MCV: 79.9 fL — ABNORMAL LOW (ref 80.0–100.0)
Platelets: 121 10*3/uL — ABNORMAL LOW (ref 150–400)
RBC: 3.58 MIL/uL — ABNORMAL LOW (ref 4.22–5.81)
RDW: 14.7 % (ref 11.5–15.5)
WBC: 6.9 10*3/uL (ref 4.0–10.5)
nRBC: 0 % (ref 0.0–0.2)

## 2019-08-01 LAB — BASIC METABOLIC PANEL
Anion gap: 17 — ABNORMAL HIGH (ref 5–15)
BUN: 48 mg/dL — ABNORMAL HIGH (ref 6–20)
CO2: 23 mmol/L (ref 22–32)
Calcium: 9.8 mg/dL (ref 8.9–10.3)
Chloride: 97 mmol/L — ABNORMAL LOW (ref 98–111)
Creatinine, Ser: 9.94 mg/dL — ABNORMAL HIGH (ref 0.61–1.24)
GFR calc Af Amer: 6 mL/min — ABNORMAL LOW (ref >60)
GFR calc non Af Amer: 5 mL/min — ABNORMAL LOW (ref >60)
Glucose, Bld: 88 mg/dL (ref 70–99)
Potassium: 6 mmol/L — ABNORMAL HIGH (ref 3.5–5.1)
Sodium: 137 mmol/L (ref 135–145)

## 2019-08-01 LAB — CBC WITH DIFFERENTIAL/PLATELET
Abs Immature Granulocytes: 0.04 10*3/uL (ref 0.00–0.07)
Basophils Absolute: 0.1 10*3/uL (ref 0.0–0.1)
Basophils Relative: 1 %
Eosinophils Absolute: 0.1 10*3/uL (ref 0.0–0.5)
Eosinophils Relative: 2 %
HCT: 28.9 % — ABNORMAL LOW (ref 39.0–52.0)
Hemoglobin: 9.9 g/dL — ABNORMAL LOW (ref 13.0–17.0)
Immature Granulocytes: 1 %
Lymphocytes Relative: 15 %
Lymphs Abs: 1 10*3/uL (ref 0.7–4.0)
MCH: 27.5 pg (ref 26.0–34.0)
MCHC: 34.3 g/dL (ref 30.0–36.0)
MCV: 80.3 fL (ref 80.0–100.0)
Monocytes Absolute: 0.5 10*3/uL (ref 0.1–1.0)
Monocytes Relative: 8 %
Neutro Abs: 4.8 10*3/uL (ref 1.7–7.7)
Neutrophils Relative %: 73 %
Platelets: 116 10*3/uL — ABNORMAL LOW (ref 150–400)
RBC: 3.6 MIL/uL — ABNORMAL LOW (ref 4.22–5.81)
RDW: 14.8 % (ref 11.5–15.5)
WBC: 6.5 10*3/uL (ref 4.0–10.5)
nRBC: 0 % (ref 0.0–0.2)

## 2019-08-01 LAB — POCT I-STAT EG7
Acid-Base Excess: 4 mmol/L — ABNORMAL HIGH (ref 0.0–2.0)
Bicarbonate: 27.8 mmol/L (ref 20.0–28.0)
Calcium, Ion: 1.16 mmol/L (ref 1.15–1.40)
HCT: 33 % — ABNORMAL LOW (ref 39.0–52.0)
Hemoglobin: 11.2 g/dL — ABNORMAL LOW (ref 13.0–17.0)
O2 Saturation: 99 %
Potassium: 5.5 mmol/L — ABNORMAL HIGH (ref 3.5–5.1)
Sodium: 136 mmol/L (ref 135–145)
TCO2: 29 mmol/L (ref 22–32)
pCO2, Ven: 36 mmHg — ABNORMAL LOW (ref 44.0–60.0)
pH, Ven: 7.496 — ABNORMAL HIGH (ref 7.250–7.430)
pO2, Ven: 148 mmHg — ABNORMAL HIGH (ref 32.0–45.0)

## 2019-08-01 LAB — COMPREHENSIVE METABOLIC PANEL
ALT: 14 U/L (ref 0–44)
AST: 27 U/L (ref 15–41)
Albumin: 3.5 g/dL (ref 3.5–5.0)
Alkaline Phosphatase: 83 U/L (ref 38–126)
Anion gap: 12 (ref 5–15)
BUN: 42 mg/dL — ABNORMAL HIGH (ref 6–20)
CO2: 25 mmol/L (ref 22–32)
Calcium: 9.4 mg/dL (ref 8.9–10.3)
Chloride: 96 mmol/L — ABNORMAL LOW (ref 98–111)
Creatinine, Ser: 8.94 mg/dL — ABNORMAL HIGH (ref 0.61–1.24)
GFR calc Af Amer: 7 mL/min — ABNORMAL LOW (ref >60)
GFR calc non Af Amer: 6 mL/min — ABNORMAL LOW (ref >60)
Glucose, Bld: 98 mg/dL (ref 70–99)
Potassium: 6.4 mmol/L (ref 3.5–5.1)
Sodium: 133 mmol/L — ABNORMAL LOW (ref 135–145)
Total Bilirubin: 0.7 mg/dL (ref 0.3–1.2)
Total Protein: 6.3 g/dL — ABNORMAL LOW (ref 6.5–8.1)

## 2019-08-01 LAB — LIPASE, BLOOD: Lipase: 21 U/L (ref 11–51)

## 2019-08-01 LAB — CULTURE, BODY FLUID W GRAM STAIN -BOTTLE: Culture: NO GROWTH

## 2019-08-01 MED ORDER — HYDROMORPHONE HCL 1 MG/ML IJ SOLN
0.5000 mg | Freq: Once | INTRAMUSCULAR | Status: AC
  Administered 2019-08-01: 0.5 mg via INTRAVENOUS
  Filled 2019-08-01: qty 1

## 2019-08-01 MED ORDER — ONDANSETRON HCL 4 MG/2ML IJ SOLN
4.0000 mg | Freq: Once | INTRAMUSCULAR | Status: AC
  Administered 2019-08-01: 4 mg via INTRAVENOUS
  Filled 2019-08-01: qty 2

## 2019-08-01 MED ORDER — SODIUM ZIRCONIUM CYCLOSILICATE 10 G PO PACK
10.0000 g | PACK | Freq: Once | ORAL | Status: AC
  Administered 2019-08-01: 07:00:00 10 g via ORAL
  Filled 2019-08-01: qty 1

## 2019-08-01 MED ORDER — LOKELMA 5 G PO PACK: 5.0000 g | PACK | Freq: Two times a day (BID) | ORAL | 0 refills | Status: DC

## 2019-08-01 NOTE — ED Triage Notes (Signed)
Medications given and charted per MAR. Tolerated well.  

## 2019-08-01 NOTE — ED Notes (Signed)
Pt refused EKG.

## 2019-08-01 NOTE — ED Provider Notes (Signed)
[facility] EMERGENCY DEPARTMENT Provider Note   CSN: [id number] Arrival date & time: 08/01/19  0137     History Chief Complaint  Patient presents with  . Tachycardia    [name] is a 59 y.o. male.  59 yo M with a cc of sob, abdominal pain and palpitations.  Going on suddenly this evening.   The history is provided by the patient.  Illness Severity:  Moderate Onset quality:  Gradual Duration:  2 days Timing:  Constant Progression:  Worsening Chronicity:  New Associated symptoms: shortness of breath   Associated symptoms: no abdominal pain, no chest pain, no congestion, no diarrhea, no fever, no headaches, no myalgias, no rash and no vomiting        Past Medical History:  Diagnosis Date  . Anemia   . Anxiety   . Arthritis    left shoulder  . Atherosclerosis of aorta ([place])   . Cardiomegaly   . Chest pain    DATE UNKNOWN, C/O PERIODICALLY  . Cocaine abuse ([place])   . COPD exacerbation ([place]) 08/17/2016  . Coronary artery disease    stent 02/22/17  . ESRD (end stage renal disease) on dialysis ([place])    "[name]; MWF" (07/04/2017)  . GERD (gastroesophageal reflux disease)    DATE UNKNOWN  . Hemorrhoids   . Hepatitis B, chronic ([place])   . Hepatitis C   . History of kidney stones   . Hyperkalemia   . Hypertension   . Kidney failure   . Metabolic bone disease    Patient denies  . Mitral stenosis   . Myocardial infarction ([place])   . Pneumonia   . Pulmonary edema   . Solitary rectal ulcer syndrome 07/2017   at flex sig for rectal bleeding  . Tubular adenoma of colon     Patient Active Problem List   Diagnosis Date Noted  . Dyspnea 06/09/2019  . Acquired thrombophilia ([place]) 06/05/2019  . A-fib ([place]) 05/30/2019  . Atrial fibrillation with RVR ([place]) 05/29/2019  . Melena   . Pressure injury of skin 03/09/2019  . Abdominal distention   . Volume overload 12/28/2018  . Sepsis ([place]) 09/12/2018  . Atherosclerosis of native  coronary artery of native heart without angina pectoris 03/11/2018  . Benign neoplasm of cecum   . Benign neoplasm of ascending colon   . Benign neoplasm of descending colon   . Benign neoplasm of rectum   . Paroxysmal atrial fibrillation ([place]) 01/23/2018  . Hx of colonic polyps 01/20/2018  . ESRD (end stage renal disease) ([place]) 11/21/2017  . GERD (gastroesophageal reflux disease) 11/16/2017  . Liver cirrhosis ([place]) 11/15/2017  . DNR (do not resuscitate)   . Palliative care by specialist   . Hyponatremia 11/04/2017  . SBP (spontaneous bacterial peritonitis) ([place]) 10/30/2017  . Liver disease, chronic 10/30/2017  . SOB (shortness of breath)   . Abdominal pain 10/28/2017  . Upper airway cough syndrome with flattening on f/v loop 10/13/17 c/w vcd 10/17/2017  . Elevated diaphragm 10/13/2017  . Ileus ([place]) 09/29/2017  . QT prolongation 09/29/2017  . Malnutrition of moderate degree 09/29/2017  . Sinus congestion 09/03/2017  . Symptomatic anemia 09/02/2017  . Other cirrhosis of liver ([place]) 09/02/2017  . Left bundle branch block 09/02/2017  . Mitral stenosis 09/02/2017  . Hematochezia 07/15/2017  . Wide-complex tachycardia ([place])   . Endotracheally intubated   . ESRD on dialysis ([place]) 07/04/2017  . Acute respiratory failure with hypoxia ([place]) 06/18/2017  . CKD (  chronic kidney disease) stage V requiring chronic dialysis ([place]) 06/18/2017  . History of Cocaine abuse ([place]) 06/18/2017  . Hypertension 06/18/2017  . Infection of AV graft for dialysis ([place]) 06/18/2017  . Anxiety 06/18/2017  . Anemia due to chronic kidney disease 06/18/2017  . Atypical atrial flutter ([place]) 06/18/2017  . Personality disorder ([place]) 06/13/2017  . Cellulitis 06/12/2017  . Adjustment disorder with mixed anxiety and depressed mood 06/10/2017  . Suicidal ideation 06/10/2017  . Arm wound, left, sequela 06/10/2017  . Dyspnea on exertion 05/29/2017  . Tachycardia 05/29/2017  . Hyperkalemia 05/22/2017  . Acute  metabolic encephalopathy   . Anemia 04/23/2017  . Ascites 04/23/2017  . COPD (chronic obstructive pulmonary disease) ([place]) 04/23/2017  . Acute on chronic respiratory failure with hypoxia ([place]) 03/25/2017  . Arrhythmia 03/25/2017  . COPD GOLD 0 with flattening on inps f/v  09/27/2016  . Essential hypertension 09/27/2016  . Fluid overload 08/30/2016  . COPD exacerbation ([place]) 08/17/2016  . Hypertensive urgency 08/17/2016  . Respiratory failure ([place]) 08/17/2016  . Problem with dialysis access ([place]) 07/23/2016  . Chronic hepatitis B ([place]) 03/05/2014  . Chronic hepatitis C without hepatic coma ([place]) 03/05/2014  . Internal hemorrhoids with bleeding, swelling and itching 03/05/2014  . Thrombocytopenia ([place]) 03/05/2014  . Chest pain 02/27/2014  . Alcohol abuse 04/14/2009  . Cigarette smoker 04/14/2009  . GANGLION CYST 04/14/2009    Past Surgical History:  Procedure Laterality Date  . A/V FISTULAGRAM Left 05/26/2017   Procedure: A/V FISTULAGRAM;  Surgeon: Conrad Byron, MD;  Location: [facility];  Service: Cardiovascular;  Laterality: Left;  . A/V FISTULAGRAM Right 11/18/2017   Procedure: A/V FISTULAGRAM - Right Arm;  Surgeon: Elam Dutch, MD;  Location: [place] CV LAB;  Service: Cardiovascular;  Laterality: Right;  . APPLICATION OF WOUND VAC Left 06/14/2017   Procedure: APPLICATION OF WOUND VAC;  Surgeon: Katha Cabal, MD;  Location: [facility] ORS;  Service: Vascular;  Laterality: Left;  . AV FISTULA PLACEMENT  2012   BELIEVED WAS PLACED IN JUNE  . AV FISTULA PLACEMENT Right 08/09/2017   Procedure: Creation Right arm ARTERIOVENOUS BRACHIOCEPOHALIC FISTULA;  Surgeon: Elam Dutch, MD;  Location: [facility] OR;  Service: Vascular;  Laterality: Right;  . AV FISTULA PLACEMENT Right 11/22/2017   Procedure: INSERTION OF ARTERIOVENOUS (AV) GORE-TEX GRAFT RIGHT UPPER ARM;  Surgeon: Elam Dutch, MD;  Location: [place];  Service: Vascular;  Laterality: Right;  . BIOPSY  01/25/2018     Procedure: BIOPSY;  Surgeon: Jerene Bears, MD;  Location: [place];  Service: Gastroenterology;;  . BIOPSY  04/10/2019   Procedure: BIOPSY;  Surgeon: Jerene Bears, MD;  Location: [facility];  Service: Gastroenterology;;  . COLONOSCOPY    . COLONOSCOPY WITH PROPOFOL N/A 01/25/2018   Procedure: COLONOSCOPY WITH PROPOFOL;  Surgeon: Jerene Bears, MD;  Location: [place];  Service: Gastroenterology;  Laterality: N/A;  . CORONARY STENT INTERVENTION N/A 02/22/2017   Procedure: CORONARY STENT INTERVENTION;  Surgeon: Nigel Mormon, MD;  Location: [facility];  Service: Cardiovascular;  Laterality: N/A;  . ESOPHAGOGASTRODUODENOSCOPY (EGD) WITH PROPOFOL N/A 01/25/2018   Procedure: ESOPHAGOGASTRODUODENOSCOPY (EGD) WITH PROPOFOL;  Surgeon: Jerene Bears, MD;  Location: [place];  Service: Gastroenterology;  Laterality: N/A;  . ESOPHAGOGASTRODUODENOSCOPY (EGD) WITH PROPOFOL N/A 04/10/2019   Procedure: ESOPHAGOGASTRODUODENOSCOPY (EGD) WITH PROPOFOL;  Surgeon: Jerene Bears, MD;  Location: [facility];  Service: Gastroenterology;  Laterality: N/A;  . FLEXIBLE SIGMOIDOSCOPY N/A 07/15/2017  Procedure: FLEXIBLE SIGMOIDOSCOPY;  Surgeon: Carol Ada, MD;  Location: [place];  Service: Endoscopy;  Laterality: N/A;  . HEMORRHOID BANDING    . I & D EXTREMITY Left 06/01/2017   Procedure: IRRIGATION AND DEBRIDEMENT LEFT ARM HEMATOMA WITH LIGATION OF LEFT ARM AV FISTULA;  Surgeon: Elam Dutch, MD;  Location: [place];  Service: Vascular;  Laterality: Left;  . I & D EXTREMITY Left 06/14/2017   Procedure: IRRIGATION AND DEBRIDEMENT EXTREMITY;  Surgeon: Katha Cabal, MD;  Location: [facility];  Service: Vascular;  Laterality: Left;  . INSERTION OF DIALYSIS CATHETER  05/30/2017  . INSERTION OF DIALYSIS CATHETER N/A 05/30/2017   Procedure: INSERTION OF DIALYSIS CATHETER;  Surgeon: Elam Dutch, MD;  Location: [place];  Service: Vascular;  Laterality: N/A;  . IR PARACENTESIS   08/30/2017  . IR PARACENTESIS  09/29/2017  . IR PARACENTESIS  10/28/2017  . IR PARACENTESIS  11/09/2017  . IR PARACENTESIS  11/16/2017  . IR PARACENTESIS  11/28/2017  . IR PARACENTESIS  12/01/2017  . IR PARACENTESIS  12/06/2017  . IR PARACENTESIS  01/03/2018  . IR PARACENTESIS  01/23/2018  . IR PARACENTESIS  02/07/2018  . IR PARACENTESIS  02/21/2018  . IR PARACENTESIS  03/06/2018  . IR PARACENTESIS  03/17/2018  . IR PARACENTESIS  04/04/2018  . IR PARACENTESIS  12/28/2018  . IR PARACENTESIS  01/08/2019  . IR PARACENTESIS  01/23/2019  . IR PARACENTESIS  02/01/2019  . IR PARACENTESIS  02/19/2019  . IR PARACENTESIS  03/01/2019  . IR PARACENTESIS  03/15/2019  . IR PARACENTESIS  04/03/2019  . IR PARACENTESIS  04/12/2019  . IR PARACENTESIS  05/01/2019  . IR PARACENTESIS  05/08/2019  . IR PARACENTESIS  05/24/2019  . IR PARACENTESIS  06/12/2019  . IR PARACENTESIS  07/09/2019  . IR PARACENTESIS  07/27/2019  . IR RADIOLOGIST EVAL & MGMT  02/14/2018  . IR RADIOLOGIST EVAL & MGMT  02/22/2019  . LEFT HEART CATH AND CORONARY ANGIOGRAPHY N/A 02/22/2017   Procedure: LEFT HEART CATH AND CORONARY ANGIOGRAPHY;  Surgeon: Nigel Mormon, MD;  Location: [place] CV LAB;  Service: Cardiovascular;  Laterality: N/A;  . LIGATION OF ARTERIOVENOUS  FISTULA Left 0/09/90   Procedure: Plication of Left Arm Arteriovenous Fistula;  Surgeon: Elam Dutch, MD;  Location: [place];  Service: Vascular;  Laterality: Left;  . POLYPECTOMY    . POLYPECTOMY  01/25/2018   Procedure: POLYPECTOMY;  Surgeon: Jerene Bears, MD;  Location: [place];  Service: Gastroenterology;;  . REVISON OF ARTERIOVENOUS FISTULA Left 10/08/760   Procedure: PLICATION OF DISTAL ANEURYSMAL SEGEMENT OF LEFT UPPER ARM ARTERIOVENOUS FISTULA;  Surgeon: Elam Dutch, MD;  Location: [place];  Service: Vascular;  Laterality: Left;  . REVISON OF ARTERIOVENOUS FISTULA Left 2/63/3354   Procedure: Plication of Left Upper Arm Fistula ;  Surgeon: Waynetta Sandy,  MD;  Location: [place];  Service: Vascular;  Laterality: Left;  . SKIN GRAFT SPLIT THICKNESS LEG / FOOT Left    SKIN GRAFT SPLIT THICKNESS LEFT ARM DONOR SITE: LEFT ANTERIOR THIGH  . SKIN SPLIT GRAFT Left 07/04/2017   Procedure: SKIN GRAFT SPLIT THICKNESS LEFT ARM DONOR SITE: LEFT ANTERIOR THIGH;  Surgeon: Elam Dutch, MD;  Location: [place];  Service: Vascular;  Laterality: Left;  . THROMBECTOMY W/ EMBOLECTOMY Left 06/05/2017   Procedure: EXPLORATION OF LEFT ARM FOR BLEEDING; OVERSEWED PROXIMAL FISTULA;  Surgeon: Angelia Mould, MD;  Location: [place];  Service: Vascular;  Laterality: Left;  .  WOUND EXPLORATION Left 06/03/2017   Procedure: WOUND EXPLORATION WITH WOUND VAC APPLICATION TO LEFT ARM;  Surgeon: Angelia Mould, MD;  Location: [facility] OR;  Service: Vascular;  Laterality: Left;       Family History  Problem Relation Age of Onset  . Heart disease Mother   . Lung cancer Mother   . Heart disease Father   . Malignant hyperthermia Father   . COPD Father   . Throat cancer Sister   . Esophageal cancer Sister   . Hypertension Other   . COPD Other   . Colon cancer Neg Hx   . Colon polyps Neg Hx   . Rectal cancer Neg Hx   . Stomach cancer Neg Hx     Social History   Tobacco Use  . Smoking status: Current Every Day Smoker    Packs/day: 0.50    Years: 43.00    Pack years: 21.50    Types: Cigarettes    Start date: 08/13/1973  . Smokeless tobacco: Never Used  . Tobacco comment: i dont know i just make   Substance Use Topics  . Alcohol use: Not Currently    Comment: quit drinking in 2017  . Drug use: Not Currently    Types: Marijuana, Cocaine    Comment: reports using once every 3 months, 04-06-2019 was this     Home Medications Prior to Admission medications   Medication Sig Start Date End Date Taking? Authorizing Provider  albuterol (VENTOLIN HFA) 108 (90 Base) MCG/ACT inhaler Inhale 2 puffs into the lungs every 6 (six) hours as needed for wheezing or  shortness of breath.    [provider]  amiodarone (PACERONE) 200 MG tablet Take 1 tablet (200 mg total) by mouth daily. 07/22/19 08/21/19  Lucrezia Starch, MD  hydrALAZINE (APRESOLINE) 100 MG tablet Take 0.5 tablets (50 mg total) by mouth 3 (three) times daily. 05/31/19   [place]omfield, Carley D, DO  lactulose (CHRONULAC) 10 GM/15ML solution TAKE 45 MLS BY MOUTH 2 TIMES DAILY. Patient taking differently: Take 30 g by mouth 2 (two) times daily.  06/04/19   Al Decant, MD  loperamide (IMODIUM A-D) 2 MG tablet Take 2 mg by mouth 4 (four) times daily as needed for diarrhea or loose stools.    [provider]  metoprolol tartrate (LOPRESSOR) 100 MG tablet Take 1 tablet (100 mg total) by mouth 2 (two) times daily. 06/04/19   Al Decant, MD  montelukast (SINGULAIR) 10 MG tablet Take 10 mg by mouth every evening.     [provider]  ondansetron (ZOFRAN) 8 MG tablet Take 8 mg by mouth every 4 (four) hours as needed for nausea.  06/14/19   [provider]  oxyCODONE (OXY IR/ROXICODONE) 5 MG immediate release tablet Take 5 mg by mouth every 6 (six) hours as needed for moderate pain.  07/17/19   [provider]  pantoprazole (PROTONIX) 40 MG tablet Take 1 tablet (40 mg total) by mouth daily. Patient taking differently: Take 40 mg by mouth See admin instructions. Take 40 mg by mouth in the morning before breakfast and an additional 40 mg once a day, if symptoms persist 06/05/19   Maudie Mercury, MD  sevelamer carbonate (RENVELA) 800 MG tablet Take 4 tablets with meals  And 2 tablets twice daily with snacks Patient taking differently: Take 1,600-2,400 mg by mouth See admin instructions. Take 2,400 mg by mouth three times a day with meals and 1,600 mg with each snack 01/22/18   Vance Gather  B, MD  sulfamethoxazole-trimethoprim (BACTRIM DS) 800-160 MG tablet Take 1 tablet by mouth daily. 12/30/18   Geradine Girt, DO  SYMBICORT 160-4.5 MCG/ACT inhaler Inhale 2 puffs  into the lungs 2 (two) times daily.  11/30/18   [provider]  zolpidem (AMBIEN) 10 MG tablet Take 10 mg by mouth at bedtime as needed for sleep.    [provider]    Allergies    Morphine and related, Aspirin, Clonidine derivatives, Tramadol, and Tylenol [acetaminophen]  Review of Systems   Review of Systems  Constitutional: Negative for chills and fever.  HENT: Negative for congestion and facial swelling.   Eyes: Negative for discharge and visual disturbance.  Respiratory: Positive for shortness of breath.   Cardiovascular: Negative for chest pain and palpitations.  Gastrointestinal: Negative for abdominal pain, diarrhea and vomiting.  Musculoskeletal: Negative for arthralgias and myalgias.  Skin: Negative for color change and rash.  Neurological: Negative for tremors, syncope and headaches.  Psychiatric/Behavioral: Negative for confusion and dysphoric mood.    Physical Exam Updated Vital Signs BP 127/86   Pulse (!) 50   Temp 98.2 F (36.8 C) (Oral)   Resp 13   SpO2 98%   Physical Exam Vitals and nursing note reviewed.  Constitutional:      Appearance: He is well-developed.  HENT:     Head: Normocephalic and atraumatic.  Eyes:     Pupils: Pupils are equal, round, and reactive to light.  Neck:     Vascular: No JVD.  Cardiovascular:     Rate and Rhythm: Normal rate and regular rhythm.     Heart sounds: No murmur. No friction rub. No gallop.   Pulmonary:     Effort: No respiratory distress.     Breath sounds: No wheezing.  Abdominal:     General: There is distension.     Tenderness: There is abdominal tenderness (mild diffuse). There is no guarding or rebound.  Musculoskeletal:        General: Normal range of motion.     Cervical back: Normal range of motion and neck supple.  Skin:    Coloration: Skin is not pale.     Findings: No rash.  Neurological:     Mental Status: He is alert and oriented to person, place, and time.  Psychiatric:         Behavior: Behavior normal.     ED Results / Procedures / Treatments   Labs (all labs ordered are listed, but only abnormal results are displayed) Labs Reviewed  CBC WITH DIFFERENTIAL/PLATELET - Abnormal; Notable for the following components:      Result Value   RBC 3.60 (*)    Hemoglobin 9.9 (*)    HCT 28.9 (*)    Platelets 116 (*)    All other components within normal limits  COMPREHENSIVE METABOLIC PANEL - Abnormal; Notable for the following components:   Sodium 133 (*)    Potassium 6.4 (*)    Chloride 96 (*)    BUN 42 (*)    Creatinine, Ser 8.94 (*)    Total Protein 6.3 (*)    GFR calc non Af Amer 6 (*)    GFR calc Af Amer 7 (*)    All other components within normal limits  POCT I-STAT EG7 - Abnormal; Notable for the following components:   pH, Ven 7.496 (*)    pCO2, Ven 36.0 (*)    pO2, Ven 148.0 (*)    Acid-Base Excess 4.0 (*)  Potassium 5.5 (*)    HCT 33.0 (*)    Hemoglobin 11.2 (*)    All other components within normal limits  LIPASE, BLOOD    EKG EKG Interpretation  Date/Time:  Wednesday August 01 2019 01:42:35 EST Ventricular Rate:  82 PR Interval:    QRS Duration: 176 QT Interval:  498 QTC Calculation: 553 R Axis:   -85 Text Interpretation: Atrial fibrillation Left bundle branch block No significant change since last tracing Confirmed by Deno Etienne [phone]) on 08/01/2019 1:53:40 AM   Radiology DG Chest Port 1 View  Result Date: 08/01/2019 CLINICAL DATA:  Shortness of breath. EXAM: PORTABLE CHEST 1 VIEW COMPARISON:  Radiograph 07/29/2019 FINDINGS: Stable cardiomegaly. Unchanged mediastinal contours with aortic atherosclerosis. Similar vascular congestion. No large pleural effusion or focal airspace disease. No pneumothorax. No acute osseous abnormalities. IMPRESSION: Stable cardiomegaly and vascular congestion.  No new abnormality. Aortic Atherosclerosis (ICD10-I70.0). Electronically Signed   By: Keith Rake M.D.   On: 08/01/2019 02:38     Procedures Procedures (including critical care time)  Medications Ordered in ED Medications  HYDROmorphone (DILAUDID) injection 0.5 mg (has no administration in time range)  sodium zirconium cyclosilicate (LOKELMA) packet 10 g (has no administration in time range)  HYDROmorphone (DILAUDID) injection 0.5 mg (0.5 mg Intravenous Given 08/01/19 0223)  ondansetron (ZOFRAN) injection 4 mg (4 mg Intravenous Given 08/01/19 8338)    ED Course  I have reviewed the triage vital signs and the nursing notes.  Pertinent labs & imaging results that were available during my care of the patient were reviewed by me and considered in my medical decision making (see chart for details).    MDM Rules/Calculators/A&P                      59 yo M with a chief complaints of sob and abdominal pain. Patient is here frequently. Was recently here 2 days ago for a similar complaints and ended up getting emergent dialysis. EMS was initially concerned with ventricular tachycardia on his last visit as well as this 1. Seems most likely to be A. fib with aberrancy the patient had some improvement with adenosine and now is rate controlled. He has chronic abdominal pain as noted in discharge. Tympanic to percussion. Will obtain lab work chest x-ray treat his pain and reassess.  Lab work with a potassium of 6.4 no acidosis not fluid overloaded.  I discussed the case with the nephrologist on-call, Dr. Posey Pronto.  He recommended giving 110 g dose of Lokelma and having the patient follow-up with scheduled dialysis today.  CRITICAL CARE Performed by: Cecilio Asper   Total critical care time: 35 minutes  Critical care time was exclusive of separately billable procedures and treating other patients.  Critical care was necessary to treat or prevent imminent or life-threatening deterioration.  Critical care was time spent personally by me on the following activities: development of treatment plan with patient and/or  surrogate as well as nursing, discussions with consultants, evaluation of patient's response to treatment, examination of patient, obtaining history from patient or surrogate, ordering and performing treatments and interventions, ordering and review of laboratory studies, ordering and review of radiographic studies, pulse oximetry and re-evaluation of patient's condition.  6:23 AM:  I have discussed the diagnosis/risks/treatment options with the patient and believe the pt to be eligible for discharge home to follow-up with Dialysis. We also discussed returning to the ED immediately if new or worsening sx occur. We discussed the sx which  are most concerning (e.g., sudden worsening pain, fever, inability to tolerate by mouth) that necessitate immediate return. Medications administered to the patient during their visit and any new prescriptions provided to the patient are listed below.  Medications given during this visit Medications  HYDROmorphone (DILAUDID) injection 0.5 mg (has no administration in time range)  sodium zirconium cyclosilicate (LOKELMA) packet 10 g (has no administration in time range)  HYDROmorphone (DILAUDID) injection 0.5 mg (0.5 mg Intravenous Given 08/01/19 0223)  ondansetron (ZOFRAN) injection 4 mg (4 mg Intravenous Given 08/01/19 0223)     The patient appears reasonably screen and/or stabilized for discharge and I doubt any other medical condition or other [facility] requiring further screening, evaluation, or treatment in the ED at this time prior to discharge.    Final Clinical Impression(s) / ED Diagnoses Final diagnoses:  Atrial fibrillation with RVR (HCC)  Chronic abdominal pain  Hyperkalemia    Rx / DC Orders ED Discharge Orders    None       Deno Etienne, DO 08/01/19 8527

## 2019-08-01 NOTE — ED Notes (Signed)
Pt came to the ED via EMS. Pt conscious, breathing, and A&Ox4. Pt felt his heart rate racing. Per EMS pt HR was 160's and monomorphic V-tach. EMS established an 18G on the left forearm which flushed easily but did not draw back blood. Pt denies cp, sob, dizziness, n/v/d, and f/c. Pt endorses abdominal pain. Abdomen distended upon examination with generalized pain. PIVC placed on the left hand with a 20G which had positive blood return and flushed without pain or infiltration. Blood collected, labeled, and sent to lab. Floyd DO at bedside assessing and discussing plan of care. Xray at bedside. No distress noted. 12-L EKG obtained

## 2019-08-01 NOTE — ED Triage Notes (Signed)
Pt reports continued Sob and severe abd pain, seen at [facility] last night with high K+ level, was told to go to dialysis today, pt went but states they refused to dialyze him. Pt alert, speaking in complete sentences, 100% on room air.

## 2019-08-01 NOTE — ED Notes (Signed)
Pt discharged from the ED. Pt conscious, breathing, and A&Ox4. Pt speaking in complete sentences. No distress noted. Pt give discharge paperwork in which the patient read and understood. Pt pushed out to the lobby where he was going to call a friend to pick him up.

## 2019-08-01 NOTE — Progress Notes (Signed)
Primary Physician:  [name], FNP   Patient ID: [name], male    DOB: 11/16/1960, 59 y.o.   MRN: [id number]  Subjective:    Chief Complaint  Patient presents with  . Atrial Fibrillation  . Tachycardia  . Hypertension  . Follow-up    HPI: [name]  is a 60 y.o. male  with very complex medical problems and has been noncompliant of therapy. He has history of severe COPD, cor pulmonale with pulmonary hypertension, known coronary artery disease s/p stenting to mid RCA in 2018,hypertensive cardiomyopathy, dietary and medication noncompliance, end-stage renal disease on hemodialysis, chronic hepatitis B and C, tobacco use , smokes 1/2 ppd, h/o marijuana use and prior cocaine use quit in 2017, and Paroxysmal atrial fibrillation with wide complex tachycardia. He has had multiple ER visits for A fib with RVR, syncope, and hyperkalemia since he was last seen in April 2020. He has also missed several appointments with [place].  Patients main complaint today is abdominal bloating. States that he cannot take Lactulose due to nausea and vomiting. He is requesting bowel prep. States that he showed up late for dialysis this morning and they never hooked him up, so he left. He states that he has still had episodes of elevated heart rate.    He denies any shortness of breath. Patient denies any complaints of chest pain, tightness or pressure. No orthopnea or PND. No history of swelling on the legs. No history of severe dizziness, near-syncope or syncope. No history of any active bleeding.  Past Medical History:  Diagnosis Date  . Anemia   . Anxiety   . Arthritis    left shoulder  . Atherosclerosis of aorta ([place])   . Cardiomegaly   . Chest pain    DATE UNKNOWN, C/O PERIODICALLY  . Cocaine abuse ([place])   . COPD exacerbation ([place]) 08/17/2016  . Coronary artery disease    stent 02/22/17  . ESRD (end stage renal disease) on dialysis ([place])    "[name]; MWF"  (07/04/2017)  . GERD (gastroesophageal reflux disease)    DATE UNKNOWN  . Hemorrhoids   . Hepatitis B, chronic ([place])   . Hepatitis C   . History of kidney stones   . Hyperkalemia   . Hypertension   . Kidney failure   . Metabolic bone disease    Patient denies  . Mitral stenosis   . Myocardial infarction ([place])   . Pneumonia   . Pulmonary edema   . Solitary rectal ulcer syndrome 07/2017   at flex sig for rectal bleeding  . Tubular adenoma of colon     Past Surgical History:  Procedure Laterality Date  . A/V FISTULAGRAM Left 05/26/2017   Procedure: A/V FISTULAGRAM;  Surgeon: Conrad [place], MD;  Location: [place] [facility];  Service: Cardiovascular;  Laterality: Left;  . A/V FISTULAGRAM Right 11/18/2017   Procedure: A/V FISTULAGRAM - Right Arm;  Surgeon: Elam Dutch, MD;  Location: [place] [facility];  Service: Cardiovascular;  Laterality: Right;  . APPLICATION OF WOUND VAC Left 06/14/2017   Procedure: APPLICATION OF WOUND VAC;  Surgeon: Katha Cabal, MD;  Location: [facility];  Service: Vascular;  Laterality: Left;  . AV FISTULA PLACEMENT  2012   BELIEVED WAS PLACED IN JUNE  . AV FISTULA PLACEMENT Right 08/09/2017   Procedure: Creation Right arm ARTERIOVENOUS BRACHIOCEPOHALIC FISTULA;  Surgeon: Elam Dutch, MD;  Location: [place];  Service: Vascular;  Laterality: Right;  .  AV FISTULA PLACEMENT Right 11/22/2017   Procedure: INSERTION OF ARTERIOVENOUS (AV) GORE-TEX GRAFT RIGHT UPPER ARM;  Surgeon: Elam Dutch, MD;  Location: [place];  Service: Vascular;  Laterality: Right;  . BIOPSY  01/25/2018   Procedure: BIOPSY;  Surgeon: Jerene Bears, MD;  Location: [place];  Service: Gastroenterology;;  . BIOPSY  04/10/2019   Procedure: BIOPSY;  Surgeon: Jerene Bears, MD;  Location: [facility];  Service: Gastroenterology;;  . COLONOSCOPY    . COLONOSCOPY WITH PROPOFOL N/A 01/25/2018   Procedure: COLONOSCOPY WITH PROPOFOL;  Surgeon: Jerene Bears, MD;  Location: [place];  Service: Gastroenterology;  Laterality: N/A;  . CORONARY STENT INTERVENTION N/A 02/22/2017   Procedure: CORONARY STENT INTERVENTION;  Surgeon: Nigel Mormon, MD;  Location: [place] [facility];  Service: Cardiovascular;  Laterality: N/A;  . ESOPHAGOGASTRODUODENOSCOPY (EGD) WITH PROPOFOL N/A 01/25/2018   Procedure: ESOPHAGOGASTRODUODENOSCOPY (EGD) WITH PROPOFOL;  Surgeon: Jerene Bears, MD;  Location: [place];  Service: Gastroenterology;  Laterality: N/A;  . ESOPHAGOGASTRODUODENOSCOPY (EGD) WITH PROPOFOL N/A 04/10/2019   Procedure: ESOPHAGOGASTRODUODENOSCOPY (EGD) WITH PROPOFOL;  Surgeon: Jerene Bears, MD;  [facility]: [facility];  Service: Gastroenterology;  Laterality: N/A;  . FLEXIBLE SIGMOIDOSCOPY N/A 07/15/2017   Procedure: FLEXIBLE SIGMOIDOSCOPY;  Surgeon: Carol Ada, MD;  Location: [place];  Service: Endoscopy;  Laterality: N/A;  . HEMORRHOID BANDING    . I & D EXTREMITY Left 06/01/2017   Procedure: IRRIGATION AND DEBRIDEMENT LEFT ARM HEMATOMA WITH LIGATION OF LEFT ARM AV FISTULA;  Surgeon: Elam Dutch, MD;  Location: [place];  Service: Vascular;  Laterality: Left;  . I & D EXTREMITY Left 06/14/2017   Procedure: IRRIGATION AND DEBRIDEMENT EXTREMITY;  Surgeon: Katha Cabal, MD;  Location: [facility];  Service: Vascular;  Laterality: Left;  . INSERTION OF DIALYSIS CATHETER  05/30/2017  . INSERTION OF DIALYSIS CATHETER N/A 05/30/2017   Procedure: INSERTION OF DIALYSIS CATHETER;  Surgeon: Elam Dutch, MD;  Location: [place];  Service: Vascular;  Laterality: N/A;  . IR PARACENTESIS  08/30/2017  . IR PARACENTESIS  09/29/2017  . IR PARACENTESIS  10/28/2017  . IR PARACENTESIS  11/09/2017  . IR PARACENTESIS  11/16/2017  . IR PARACENTESIS  11/28/2017  . IR PARACENTESIS  12/01/2017  . IR PARACENTESIS  12/06/2017  . IR PARACENTESIS  01/03/2018  . IR PARACENTESIS  01/23/2018  . IR PARACENTESIS  02/07/2018  . IR PARACENTESIS  02/21/2018  . IR PARACENTESIS  03/06/2018  .  IR PARACENTESIS  03/17/2018  . IR PARACENTESIS  04/04/2018  . IR PARACENTESIS  12/28/2018  . IR PARACENTESIS  01/08/2019  . IR PARACENTESIS  01/23/2019  . IR PARACENTESIS  02/01/2019  . IR PARACENTESIS  02/19/2019  . IR PARACENTESIS  03/01/2019  . IR PARACENTESIS  03/15/2019  . IR PARACENTESIS  04/03/2019  . IR PARACENTESIS  04/12/2019  . IR PARACENTESIS  05/01/2019  . IR PARACENTESIS  05/08/2019  . IR PARACENTESIS  05/24/2019  . IR PARACENTESIS  06/12/2019  . IR PARACENTESIS  07/09/2019  . IR PARACENTESIS  07/27/2019  . IR RADIOLOGIST EVAL & MGMT  02/14/2018  . IR RADIOLOGIST EVAL & MGMT  02/22/2019  . LEFT HEART CATH AND CORONARY ANGIOGRAPHY N/A 02/22/2017   Procedure: LEFT HEART CATH AND CORONARY ANGIOGRAPHY;  Surgeon: Nigel Mormon, MD;  Location: [place] [facility];  Service: Cardiovascular;  Laterality: N/A;  . LIGATION OF ARTERIOVENOUS  FISTULA Left 08/14/7626   Procedure: Plication of Left Arm Arteriovenous  Fistula;  Surgeon: Elam Dutch, MD;  Location: [place];  Service: Vascular;  Laterality: Left;  . POLYPECTOMY    . POLYPECTOMY  01/25/2018   Procedure: POLYPECTOMY;  Surgeon: Jerene Bears, MD;  Location: [place];  Service: Gastroenterology;;  . REVISON OF ARTERIOVENOUS FISTULA Left 10/18/1446   Procedure: PLICATION OF DISTAL ANEURYSMAL SEGEMENT OF LEFT UPPER ARM ARTERIOVENOUS FISTULA;  Surgeon: Elam Dutch, MD;  Location: [place];  Service: Vascular;  Laterality: Left;  . REVISON OF ARTERIOVENOUS FISTULA Left 1/85/6314   Procedure: Plication of Left Upper Arm Fistula ;  Surgeon: Waynetta Sandy, MD;  Location: [place];  Service: Vascular;  Laterality: Left;  . SKIN GRAFT SPLIT THICKNESS LEG / FOOT Left    SKIN GRAFT SPLIT THICKNESS LEFT ARM DONOR SITE: LEFT ANTERIOR THIGH  . SKIN SPLIT GRAFT Left 07/04/2017   Procedure: SKIN GRAFT SPLIT THICKNESS LEFT ARM DONOR SITE: LEFT ANTERIOR THIGH;  Surgeon: Elam Dutch, MD;  Location: [place];  Service: Vascular;  Laterality:  Left;  . THROMBECTOMY W/ EMBOLECTOMY Left 06/05/2017   Procedure: EXPLORATION OF LEFT ARM FOR BLEEDING; OVERSEWED PROXIMAL FISTULA;  Surgeon: Angelia Mould, MD;  Location: [place];  Service: Vascular;  Laterality: Left;  . WOUND EXPLORATION Left 06/03/2017   Procedure: WOUND EXPLORATION WITH WOUND VAC APPLICATION TO LEFT ARM;  Surgeon: Angelia Mould, MD;  Location: [facility];  Service: Vascular;  Laterality: Left;    Social History   Socioeconomic History  . Marital status: Single    Spouse name: Not on file  . Number of children: 3  . Years of education: 10  . Highest education level: Not on file  Occupational History  . Occupation: Unemployed  Tobacco Use  . Smoking status: Current Every Day Smoker    Packs/day: 0.50    Years: 43.00    Pack years: 21.50    Types: Cigarettes    Start date: 08/13/1973  . Smokeless tobacco: Never Used  . Tobacco comment: i dont know i just make   Substance and Sexual Activity  . Alcohol use: Not Currently    Comment: quit drinking in 2017  . Drug use: Not Currently    Types: Marijuana, Cocaine    Comment: reports using once every 3 months, 04-06-2019 was this   . Sexual activity: Not on file  Other Topics Concern  . Not on file  Social History Narrative   Lives alone   Caffeine use: Coffee-rare   Soda- daily      [facility] (03/10/17):   Originally from [facility]. Previously worked trimming trees. No pets currently. No bird or mold exposure.    Social Determinants of Health   Financial Resource Strain:   . Difficulty of Paying Living Expenses: Not on file  Food Insecurity:   . Worried About Charity fundraiser in the Last Year: Not on file  . Ran Out of Food in the Last Year: Not on file  Transportation Needs:   . Lack of Transportation (Medical): Not on file  . Lack of Transportation (Non-Medical): Not on file  Physical Activity:   . Days of Exercise per Week: Not on file  . Minutes of Exercise per Session: Not on file    Stress:   . Feeling of Stress : Not on file  Social Connections:   . Frequency of Communication with Friends and Family: Not on file  . Frequency of Social Gatherings with Friends and Family: Not on file  .  Attends Religious Services: Not on file  . Active Member of Clubs or Organizations: Not on file  . Attends Archivist Meetings: Not on file  . Marital Status: Not on file  Intimate Partner Violence:   . Fear of Current or Ex-Partner: Not on file  . Emotionally Abused: Not on file  . Physically Abused: Not on file  . Sexually Abused: Not on file    Review of Systems  Constitution: Negative for decreased appetite, malaise/fatigue, weight gain and weight loss.  Eyes: Negative for visual disturbance.  Cardiovascular: Negative for chest pain, claudication, dyspnea on exertion, leg swelling, orthopnea, palpitations and syncope.  Respiratory: Negative for hemoptysis and wheezing.   Endocrine: Negative for cold intolerance and heat intolerance.  Hematologic/Lymphatic: Does not bruise/bleed easily.  Skin: Negative for nail changes.  Musculoskeletal: Negative for muscle weakness and myalgias.  Gastrointestinal: Negative for abdominal pain, change in bowel habit, nausea and vomiting.  Neurological: Negative for difficulty with concentration, dizziness, focal weakness and headaches.  Psychiatric/Behavioral: Negative for altered mental status and suicidal ideas.  All other systems reviewed and are negative.     Objective:  Blood pressure (!) 177/95, pulse 66, temperature (!) 97 F (36.1 C), height 5\' 10"  (1.778 m), weight 151 lb 9.6 oz (68.8 kg), SpO2 97 %. Body mass index is 21.75 kg/m.    Physical Exam  Constitutional: He is oriented to person, place, and time. Vital signs are normal. He appears well-developed and well-nourished.  HENT:  Head: Normocephalic and atraumatic.  Cardiovascular: Normal rate, regular rhythm, normal heart sounds and intact distal pulses.   Pulmonary/Chest: Effort normal and breath sounds normal. No accessory muscle usage. No respiratory distress.  Abdominal: Soft. Bowel sounds are normal.  Musculoskeletal:        General: Normal range of motion.     Cervical back: Normal range of motion.  Neurological: He is alert and oriented to person, place, and time.  Skin: Skin is warm and dry.  Vitals reviewed.  Radiology: No results found.  Laboratory examination:    CMP Latest Ref Rng & Units 08/01/2019 08/01/2019 08/01/2019  Glucose 70 - 99 mg/dL 88 98 -  BUN 6 - 20 mg/dL 48(H) 42(H) -  Creatinine 0.61 - 1.24 mg/dL 9.94(H) 8.94(H) -  Sodium 135 - 145 mmol/L 137 133(L) 136  Potassium 3.5 - 5.1 mmol/L 6.0(H) 6.4(HH) 5.5(H)  Chloride 98 - 111 mmol/L 97(L) 96(L) -  CO2 22 - 32 mmol/L 23 25 -  Calcium 8.9 - 10.3 mg/dL 9.8 9.4 -  Total Protein 6.5 - 8.1 g/dL - 6.3(L) -  Total Bilirubin 0.3 - 1.2 mg/dL - 0.7 -  Alkaline Phos 38 - 126 U/L - 83 -  AST 15 - 41 U/L - 27 -  ALT 0 - 44 U/L - 14 -   CBC Latest Ref Rng & Units 08/01/2019 08/01/2019 08/01/2019  WBC 4.0 - 10.5 K/uL 6.9 - 6.5  Hemoglobin 13.0 - 17.0 g/dL 10.0(L) 11.2(L) 9.9(L)  Hematocrit 39.0 - 52.0 % 28.6(L) 33.0(L) 28.9(L)  Platelets 150 - 400 K/uL 121(L) - 116(L)   Lipid Panel     Component Value Date/Time   CHOL 103 08/17/2016 0134   TRIG 97 08/17/2016 0134   HDL 34 (L) 08/17/2016 0134   CHOLHDL 3.0 08/17/2016 0134   VLDL 19 08/17/2016 0134   LDLCALC 50 08/17/2016 0134   HEMOGLOBIN A1C Lab Results  Component Value Date   HGBA1C 4.6 (L) 04/24/2017   MPG 85.32 04/24/2017  TSH No results for input(s): TSH in the last 8760 hours.  PRN Meds:. There are no discontinued medications. Current Meds  Medication Sig  . albuterol (VENTOLIN HFA) 108 (90 Base) MCG/ACT inhaler Inhale 2 puffs into the lungs every 6 (six) hours as needed for wheezing or shortness of breath.  Marland Kitchen amiodarone (PACERONE) 200 MG tablet Take 1 tablet (200 mg total) by mouth daily.  .  hydrALAZINE (APRESOLINE) 100 MG tablet Take 0.5 tablets (50 mg total) by mouth 3 (three) times daily.  . metoprolol tartrate (LOPRESSOR) 100 MG tablet Take 1 tablet (100 mg total) by mouth 2 (two) times daily.  . montelukast (SINGULAIR) 10 MG tablet Take 10 mg by mouth every evening.   . ondansetron (ZOFRAN) 8 MG tablet Take 8 mg by mouth every 4 (four) hours as needed for nausea.   Marland Kitchen oxyCODONE (OXY IR/ROXICODONE) 5 MG immediate release tablet Take 5 mg by mouth every 6 (six) hours as needed for moderate pain.   . pantoprazole (PROTONIX) 40 MG tablet Take 1 tablet (40 mg total) by mouth daily. (Patient taking differently: Take 40 mg by mouth See admin instructions. Take 40 mg by mouth in the morning before breakfast and an additional 40 mg once a day, if symptoms persist)  . sevelamer carbonate (RENVELA) 800 MG tablet Take 4 tablets with meals  And 2 tablets twice daily with snacks (Patient taking differently: Take 1,600-2,400 mg by mouth See admin instructions. Take 2,400 mg by mouth three times a day with meals and 1,600 mg with each snack)  . sulfamethoxazole-trimethoprim (BACTRIM DS) 800-160 MG tablet Take 1 tablet by mouth daily.  . SYMBICORT 160-4.5 MCG/ACT inhaler Inhale 2 puffs into the lungs 2 (two) times daily.   Marland Kitchen zolpidem (AMBIEN) 10 MG tablet Take 10 mg by mouth at bedtime as needed for sleep.  . [DISCONTINUED] Polyethylene Glycol 3350 (MIRALAX PO) Take by mouth.    Cardiac Studies:   Coronary Angiogram [02/22/2017]: No significant disease on left, mid RCA high-grade stenosis status post 3.5 x 23 mm Xience [place] DES.  Carotid Doppler [10/28/2016]: Stenosis in the right common carotid artery (<50%) with severe heteregenous plaque. Stenosis in the left internal carotid artery (16-49%). Severe heteregenous plaque in the left common carotid artery with < 50% stenosis. Mild stenosis in the left external carotid artery (<50%). Antegrade vertebral artery flow. Follow up in one year is  appropriate if clinically indicated.  Echocardiogram  04/03/2018: Left ventricle cavity is normal in size. Severe concentric remodeling of the left ventricle. Visual EF is 45-50% with mild global hypokinesis. Abnormal septal wall motion due to right ventricular volume and pressure overload. Indeterminate diastolic filling pattern due to MV calcification. Left atrial cavity is moderately dilated at 4.5 cm and volume of 56 mL. Right atrial cavity is moderately dilated. Right ventricle cavity is mildly dilated. Normal right ventricular function. Trileaflet aortic valve with no regurgitation noted. Mild aortic valve leaflet calcification. Mild aortic valve stenosis. Aortic valve peak pressure gradient of 20 and mean gradient of 12 mmHg, calculated aortic valve area 1.69 cm. Moderate calcification of the mitral valve annulus. Mild mitral valve leaflet calcification. Mild mitral valve stenosis. No mitral valve regurgitation noted. Mitral valve peak pressure gradient of 21 and mean gradient of 8.4 mmHg, calculated mitral valve area 2.5 cm. Moderate tricuspid regurgitation due to annular dilatation. Moderate to severe pulmonary hypertension. PA systolic pressure 66 mm with CVP 15 mm Hg. IVC is dilated with poor inspiration collapse consistent with elevated right atrial  pressure. Compared to the study done on 03/21/2014, previously EF 66%, right ventricular findings, valvular findings and pulmonary hypertension are new.  Assessment:   Atrial fibrillation with controlled ventricular response (HCC) - Plan: EKG 12-Lead  Atherosclerosis of native coronary artery of native heart without angina pectoris - Plan: Lipid Profile  Essential hypertension  CKD (chronic kidney disease) stage V requiring chronic dialysis ([place])  Tobacco use disorder  EKG 08/01/19: Atrial fibrillation at 79 bpm, LBBB. No further analysis due to LBBB.  Recommendations:   Patient with complex medical history, history of  noncompliance, and multiple ER visits since last seen by [place], presents for follow up on A fib. Overall, his heart rate has been stable since being on amiodarone, with occasional episodes of RVR that appear to correlate with missing dialysis. I have urged him to be compliant with this. Will continue with Amiodarone 200 mg daily. I have not further increased this in view of his cirrhosis. Although could consider if he again has frequent episodes of RVR. Blood pressure is elevated today; however, he has missed dialysis today. I am concerned as his potassium levels were 6.4 this AM in the ER.  Discussed with Ron, RN at [facility] He has been offered to come 2nd shift tomorrow for dialysis. Patient prefers this option vs. Going back to ER for dialysis. He prefers to go to dialysis tomorrow. I will send in [place] 5mg  to be taken now and then 8 hours later, advised not to take on 01/21 as he will be going for dialysis. I advised patient that should he decide not to go to dialysis tomorrow, he will need to go back to ER for dialysis.    Denies any chest pain or shortness of breath. No recurrence of syncope. He has not recently seen his PCP, but states that he will make an appointment. It does not appear that his lipids have been evaluated in sometime. I will place an order for lipid panel; however, if he does see his PCP can be performed there instead. Encouraged him to work on quitting smoking to reduce his risk.Will arrange for him to come back to see [place] in 4-6 weeks to improve compliance, close monitoring of A fib, and to hopefully reduce ER visits.   Miquel Dunn, MSN, APRN, FNP-C [facility]. [place] Office: [phone] Fax: [phone]

## 2019-08-01 NOTE — ED Notes (Signed)
Pt resting in bed. Pt denies new or worsening complaints. No distress noted. Will continue to monitor.

## 2019-08-01 NOTE — ED Notes (Signed)
CMP redrawn and sent to lab. 

## 2019-08-01 NOTE — ED Notes (Signed)
Pt resting in bed. No distress noted. Will continue to monitor.  

## 2019-08-01 NOTE — Discharge Instructions (Signed)
Go to your dialysis center today for dialysis.  Your potassium was high if this gets too high could make you go into a fatal arrhythmia that could kill you.  Please follow-up with your doctor for your abdominal pain.  Return for worsening pain fever or inability to eat or drink.

## 2019-08-02 DIAGNOSIS — I4891 Unspecified atrial fibrillation: Secondary | ICD-10-CM | POA: Diagnosis not present

## 2019-08-02 LAB — RESPIRATORY PANEL BY RT PCR (FLU A&B, COVID)
Influenza A by PCR: NEGATIVE
Influenza B by PCR: NEGATIVE
SARS Coronavirus 2 by RT PCR: NEGATIVE

## 2019-08-02 MED ORDER — CALCIUM GLUCONATE 10 % IV SOLN
1.0000 g | Freq: Once | INTRAVENOUS | Status: AC
  Administered 2019-08-02: 1 g via INTRAVENOUS
  Filled 2019-08-02: qty 10

## 2019-08-02 MED ORDER — SODIUM BICARBONATE 8.4 % IV SOLN
50.0000 meq | Freq: Once | INTRAVENOUS | Status: AC
  Administered 2019-08-02: 50 meq via INTRAVENOUS
  Filled 2019-08-02: qty 50

## 2019-08-02 MED ORDER — SODIUM ZIRCONIUM CYCLOSILICATE 10 G PO PACK
10.0000 g | PACK | Freq: Once | ORAL | Status: AC
  Administered 2019-08-02: 02:00:00 10 g via ORAL
  Filled 2019-08-02: qty 1

## 2019-08-02 MED ORDER — CHLORHEXIDINE GLUCONATE CLOTH 2 % EX PADS: 6.0000 | MEDICATED_PAD | Freq: Every day | CUTANEOUS | Status: DC

## 2019-08-02 NOTE — ED Provider Notes (Signed)
[facility] EMERGENCY DEPARTMENT Provider Note   CSN: [id number] Arrival date & time: 08/01/19  1824     History Chief Complaint  Patient presents with   Shortness of Breath   Abdominal Pain    [name] is a 59 y.o. male.  The history is provided by the patient and medical records.  Shortness of Breath Associated symptoms: abdominal pain   Abdominal Pain Associated symptoms: shortness of breath     59 y.o. with history of end-stage renal disease on hemodialysis (schedule MWF at [facility]), chronic abdominal pain, COPD, cocaine abuse, hepatitis B and C, hypertension, presenting to the ED with shortness of breath and abdominal pain.  States he was seen in the ER last night for same, found to have potassium of 6.4.  States he was given some medicine to lower his potassium and went to dialysis today, however states he waited at the Center for several hours and was unable to be dialyzed.  States he was told to call his doctor which she did and then was sent to the ER.  He states he is not exactly sure what the problem was.  He did have dialysis on Monday, he is unable to tell me if this was a full session or not.  He does report worsening shortness of breath.  He denies any chest pain.  Also complaining of abdominal pain, this is a chronic, ongoing issue.  Feels his abdomen is distended.  He does get outpatient paracentesis when needed.  No fevers, cough, or other infectious symptoms.  He does intermittently use home O2.  Past Medical History:  Diagnosis Date   Anemia    Anxiety    Arthritis    left shoulder   Atherosclerosis of aorta (HCC)    Cardiomegaly    Chest pain    DATE UNKNOWN, C/O PERIODICALLY   Cocaine abuse (HCC)    COPD exacerbation (HCC) 08/17/2016   Coronary artery disease    stent 02/22/17   ESRD (end stage renal disease) on dialysis ([place])    "[name]; MWF" (07/04/2017)   GERD (gastroesophageal reflux disease)      DATE UNKNOWN   Hemorrhoids    Hepatitis B, chronic (HCC)    Hepatitis C    History of kidney stones    Hyperkalemia    Hypertension    Kidney failure    Metabolic bone disease    Patient denies   Mitral stenosis    Myocardial infarction [facility])    Pneumonia    Pulmonary edema    Solitary rectal ulcer syndrome 07/2017   at flex sig for rectal bleeding   Tubular adenoma of colon     Patient Active Problem List   Diagnosis Date Noted   Dyspnea 06/09/2019   Acquired thrombophilia ([place]) 06/05/2019   A-fib ([place]) 05/30/2019   Atrial fibrillation with RVR ([place]) 05/29/2019   Melena    Pressure injury of skin 03/09/2019   Abdominal distention    Volume overload 12/28/2018   Sepsis ([place]) 09/12/2018   Atherosclerosis of native coronary artery of native heart without angina pectoris 03/11/2018   Benign neoplasm of cecum    Benign neoplasm of ascending colon    Benign neoplasm of descending colon    Benign neoplasm of rectum    Paroxysmal atrial fibrillation ([place]) 01/23/2018   Hx of colonic polyps 01/20/2018   ESRD (end stage renal disease) ([place]) 11/21/2017   GERD (gastroesophageal reflux disease) 11/16/2017   Liver cirrhosis (  [place]) 11/15/2017   DNR (do not resuscitate)    Palliative care by specialist    Hyponatremia 11/04/2017   SBP (spontaneous bacterial peritonitis) ([place]) 10/30/2017   Liver disease, chronic 10/30/2017   SOB (shortness of breath)    Abdominal pain 10/28/2017   Upper airway cough syndrome with flattening on f/v loop 10/13/17 c/w vcd 10/17/2017   Elevated diaphragm 10/13/2017   Ileus ([place]) 09/29/2017   QT prolongation 09/29/2017   Malnutrition of moderate degree 09/29/2017   Sinus congestion 09/03/2017   Symptomatic anemia 09/02/2017   Other cirrhosis of liver ([place]) 09/02/2017   Left bundle branch block 09/02/2017   Mitral stenosis 09/02/2017   Hematochezia 07/15/2017   Wide-complex tachycardia ([place])     Endotracheally intubated    ESRD on dialysis ([place]) 07/04/2017   Acute respiratory failure with hypoxia ([place]) 06/18/2017   CKD (chronic kidney disease) stage V requiring chronic dialysis ([place]) 06/18/2017   History of Cocaine abuse ([place]) 06/18/2017   Hypertension 06/18/2017   Infection of AV graft for dialysis ([place]) 06/18/2017   Anxiety 06/18/2017   Anemia due to chronic kidney disease 06/18/2017   Atypical atrial flutter ([place]) 06/18/2017   Personality disorder ([place]) 06/13/2017   Cellulitis 06/12/2017   Adjustment disorder with mixed anxiety and depressed mood 06/10/2017   Suicidal ideation 06/10/2017   Arm wound, left, sequela 06/10/2017   Dyspnea on exertion 05/29/2017   Tachycardia 05/29/2017   Hyperkalemia 94/76/5465   Acute metabolic encephalopathy    Anemia 04/23/2017   Ascites 04/23/2017   COPD (chronic obstructive pulmonary disease) ([place]) 04/23/2017   Acute on chronic respiratory failure with hypoxia ([place]) 03/25/2017   Arrhythmia 03/25/2017   COPD GOLD 0 with flattening on inps f/v  09/27/2016   Essential hypertension 09/27/2016   Fluid overload 08/30/2016   COPD exacerbation ([place]) 08/17/2016   Hypertensive urgency 08/17/2016   Respiratory failure ([place]) 08/17/2016   Problem with dialysis access ([place]) 07/23/2016   Chronic hepatitis B ([place]) 03/05/2014   Chronic hepatitis C without hepatic coma ([place]) 03/05/2014   Internal hemorrhoids with bleeding, swelling and itching 03/05/2014   Thrombocytopenia ([place]) 03/05/2014   Chest pain 02/27/2014   Alcohol abuse 04/14/2009   Cigarette smoker 04/14/2009   GANGLION CYST 04/14/2009    Past Surgical History:  Procedure Laterality Date   A/V FISTULAGRAM Left 05/26/2017   Procedure: A/V FISTULAGRAM;  Surgeon: Conrad Chrisney, MD;  Location: [place] [facility];  Service: Cardiovascular;  Laterality: Left;   A/V FISTULAGRAM Right 11/18/2017   Procedure: A/V FISTULAGRAM - Right Arm;  Surgeon:  Elam Dutch, MD;  Location: [place] [facility];  Service: Cardiovascular;  Laterality: Right;   APPLICATION OF WOUND VAC Left 06/14/2017   Procedure: APPLICATION OF WOUND VAC;  Surgeon: Katha Cabal, MD;  Location: [facility] ORS;  Service: Vascular;  Laterality: Left;   AV FISTULA PLACEMENT  2012   BELIEVED WAS PLACED IN JUNE   AV FISTULA PLACEMENT Right 08/09/2017   Procedure: Creation Right arm ARTERIOVENOUS BRACHIOCEPOHALIC FISTULA;  Surgeon: Elam Dutch, MD;  Location: [facility] OR;  Service: Vascular;  Laterality: Right;   AV FISTULA PLACEMENT Right 11/22/2017   Procedure: INSERTION OF ARTERIOVENOUS (AV) GORE-TEX GRAFT RIGHT UPPER ARM;  Surgeon: Elam Dutch, MD;  Location: [place];  Service: Vascular;  Laterality: Right;   BIOPSY  01/25/2018   Procedure: BIOPSY;  Surgeon: Jerene Bears, MD;  Location: [place];  Service: Gastroenterology;;   BIOPSY  04/10/2019   Procedure: BIOPSY;  Surgeon: Jerene Bears, MD;  Location: [facility];  Service: Gastroenterology;;   COLONOSCOPY     COLONOSCOPY WITH PROPOFOL N/A 01/25/2018   Procedure: COLONOSCOPY WITH PROPOFOL;  Surgeon: Jerene Bears, MD;  Location: [place];  Service: Gastroenterology;  Laterality: N/A;   CORONARY STENT INTERVENTION N/A 02/22/2017   Procedure: CORONARY STENT INTERVENTION;  Surgeon: Nigel Mormon, MD;  Location: [place] [facility];  Service: Cardiovascular;  Laterality: N/A;   ESOPHAGOGASTRODUODENOSCOPY (EGD) WITH PROPOFOL N/A 01/25/2018   Procedure: ESOPHAGOGASTRODUODENOSCOPY (EGD) WITH PROPOFOL;  Surgeon: Jerene Bears, MD;  Location: [place];  Service: Gastroenterology;  Laterality: N/A;   ESOPHAGOGASTRODUODENOSCOPY (EGD) WITH PROPOFOL N/A 04/10/2019   Procedure: ESOPHAGOGASTRODUODENOSCOPY (EGD) WITH PROPOFOL;  Surgeon: Jerene Bears, MD;  Location: [facility];  Service: Gastroenterology;  Laterality: N/A;   FLEXIBLE SIGMOIDOSCOPY N/A 07/15/2017   Procedure: FLEXIBLE SIGMOIDOSCOPY;   Surgeon: Carol Ada, MD;  Location: [place];  Service: Endoscopy;  Laterality: N/A;   HEMORRHOID BANDING     I & D EXTREMITY Left 06/01/2017   Procedure: IRRIGATION AND DEBRIDEMENT LEFT ARM HEMATOMA WITH LIGATION OF LEFT ARM AV FISTULA;  Surgeon: Elam Dutch, MD;  Location: [place];  Service: Vascular;  Laterality: Left;   I & D EXTREMITY Left 06/14/2017   Procedure: IRRIGATION AND DEBRIDEMENT EXTREMITY;  Surgeon: Katha Cabal, MD;  Location: [facility];  Service: Vascular;  Laterality: Left;   INSERTION OF DIALYSIS CATHETER  05/30/2017   INSERTION OF DIALYSIS CATHETER N/A 05/30/2017   Procedure: INSERTION OF DIALYSIS CATHETER;  Surgeon: Elam Dutch, MD;  Location: [place];  Service: Vascular;  Laterality: N/A;   IR PARACENTESIS  08/30/2017   IR PARACENTESIS  09/29/2017   IR PARACENTESIS  10/28/2017   IR PARACENTESIS  11/09/2017   IR PARACENTESIS  11/16/2017   IR PARACENTESIS  11/28/2017   IR PARACENTESIS  12/01/2017   IR PARACENTESIS  12/06/2017   IR PARACENTESIS  01/03/2018   IR PARACENTESIS  01/23/2018   IR PARACENTESIS  02/07/2018   IR PARACENTESIS  02/21/2018   IR PARACENTESIS  03/06/2018   IR PARACENTESIS  03/17/2018   IR PARACENTESIS  04/04/2018   IR PARACENTESIS  12/28/2018   IR PARACENTESIS  01/08/2019   IR PARACENTESIS  01/23/2019   IR PARACENTESIS  02/01/2019   IR PARACENTESIS  02/19/2019   IR PARACENTESIS  03/01/2019   IR PARACENTESIS  03/15/2019   IR PARACENTESIS  04/03/2019   IR PARACENTESIS  04/12/2019   IR PARACENTESIS  05/01/2019   IR PARACENTESIS  05/08/2019   IR PARACENTESIS  05/24/2019   IR PARACENTESIS  06/12/2019   IR PARACENTESIS  07/09/2019   IR PARACENTESIS  07/27/2019   IR RADIOLOGIST EVAL & MGMT  02/14/2018   IR RADIOLOGIST EVAL & MGMT  02/22/2019   LEFT HEART CATH AND CORONARY ANGIOGRAPHY N/A 02/22/2017   Procedure: LEFT HEART CATH AND CORONARY ANGIOGRAPHY;  Surgeon: Nigel Mormon, MD;  Location: [place] [facility];   Service: Cardiovascular;  Laterality: N/A;   LIGATION OF ARTERIOVENOUS  FISTULA Left 12/11/9507   Procedure: Plication of Left Arm Arteriovenous Fistula;  Surgeon: Elam Dutch, MD;  Location: [facility] OR;  Service: Vascular;  Laterality: Left;   POLYPECTOMY     POLYPECTOMY  01/25/2018   Procedure: POLYPECTOMY;  Surgeon: Jerene Bears, MD;  Location: [place];  Service: Gastroenterology;;   REVISON OF ARTERIOVENOUS FISTULA Left 10/05/7122   Procedure: PLICATION OF DISTAL ANEURYSMAL SEGEMENT OF LEFT UPPER ARM ARTERIOVENOUS  FISTULA;  Surgeon: Elam Dutch, MD;  Location: [facility] OR;  Service: Vascular;  Laterality: Left;   REVISON OF ARTERIOVENOUS FISTULA Left 2/70/6237   Procedure: Plication of Left Upper Arm Fistula ;  Surgeon: Waynetta Sandy, MD;  Location: [place];  Service: Vascular;  Laterality: Left;   SKIN GRAFT SPLIT THICKNESS LEG / FOOT Left    SKIN GRAFT SPLIT THICKNESS LEFT ARM DONOR SITE: LEFT ANTERIOR THIGH   SKIN SPLIT GRAFT Left 07/04/2017   Procedure: SKIN GRAFT SPLIT THICKNESS LEFT ARM DONOR SITE: LEFT ANTERIOR THIGH;  Surgeon: Elam Dutch, MD;  Location: [place];  Service: Vascular;  Laterality: Left;   THROMBECTOMY W/ EMBOLECTOMY Left 06/05/2017   Procedure: EXPLORATION OF LEFT ARM FOR BLEEDING; OVERSEWED PROXIMAL FISTULA;  Surgeon: Angelia Mould, MD;  Location: [place];  Service: Vascular;  Laterality: Left;   WOUND EXPLORATION Left 06/03/2017   Procedure: WOUND EXPLORATION WITH WOUND VAC APPLICATION TO LEFT ARM;  Surgeon: Angelia Mould, MD;  Location: [facility] OR;  Service: Vascular;  Laterality: Left;       Family History  Problem Relation Age of Onset   Heart disease Mother    Lung cancer Mother    Heart disease Father    Malignant hyperthermia Father    COPD Father    Throat cancer Sister    Esophageal cancer Sister    Hypertension Other    COPD Other    Colon cancer Neg Hx    Colon polyps Neg Hx    Rectal cancer Neg Hx     Stomach cancer Neg Hx     Social History   Tobacco Use   Smoking status: Current Every Day Smoker    Packs/day: 0.50    Years: 43.00    Pack years: 21.50    Types: Cigarettes    Start date: 08/13/1973   Smokeless tobacco: Never Used   Tobacco comment: i dont know i just make   Substance Use Topics   Alcohol use: Not Currently    Comment: quit drinking in 2017   Drug use: Not Currently    Types: Marijuana, Cocaine    Comment: reports using once every 3 months, 04-06-2019 was this     Home Medications Prior to Admission medications   Medication Sig Start Date End Date Taking? Authorizing Provider  albuterol (VENTOLIN HFA) 108 (90 Base) MCG/ACT inhaler Inhale 2 puffs into the lungs every 6 (six) hours as needed for wheezing or shortness of breath.    [provider]  amiodarone (PACERONE) 200 MG tablet Take 1 tablet (200 mg total) by mouth daily. 07/22/19 08/21/19  Lucrezia Starch, MD  hydrALAZINE (APRESOLINE) 100 MG tablet Take 0.5 tablets (50 mg total) by mouth 3 (three) times daily. 05/31/19   [place], Carley D, DO  lactulose (CHRONULAC) 10 GM/15ML solution TAKE 45 MLS BY MOUTH 2 TIMES DAILY. Patient not taking: Reported on 08/01/2019 06/04/19   Al Decant, MD  loperamide (IMODIUM A-D) 2 MG tablet Take 2 mg by mouth 4 (four) times daily as needed for diarrhea or loose stools.    [provider]  metoprolol tartrate (LOPRESSOR) 100 MG tablet Take 1 tablet (100 mg total) by mouth 2 (two) times daily. 06/04/19   Al Decant, MD  montelukast (SINGULAIR) 10 MG tablet Take 10 mg by mouth every evening.     [provider]  ondansetron (ZOFRAN) 8 MG tablet Take 8 mg by mouth every 4 (four) hours as needed for nausea.  06/14/19   [provider]  oxyCODONE (OXY IR/ROXICODONE) 5 MG immediate release tablet Take 5 mg by mouth every 6 (six) hours as needed for moderate pain.  07/17/19   [provider]  pantoprazole (PROTONIX) 40 MG  tablet Take 1 tablet (40 mg total) by mouth daily. Patient taking differently: Take 40 mg by mouth See admin instructions. Take 40 mg by mouth in the morning before breakfast and an additional 40 mg once a day, if symptoms persist 06/05/19   Maudie Mercury, MD  Polyethylene Glycol 3350 (MIRALAX PO) Take by mouth.    [provider]  sevelamer carbonate (RENVELA) 800 MG tablet Take 4 tablets with meals  And 2 tablets twice daily with snacks Patient taking differently: Take 1,600-2,400 mg by mouth See admin instructions. Take 2,400 mg by mouth three times a day with meals and 1,600 mg with each snack 01/22/18   Patrecia Pour, MD  sodium zirconium cyclosilicate (LOKELMA) 5 g packet Take 5 g by mouth 2 (two) times daily. 08/01/19   Miquel Dunn, NP  sulfamethoxazole-trimethoprim (BACTRIM DS) 800-160 MG tablet Take 1 tablet by mouth daily. 12/30/18   Geradine Girt, DO  SYMBICORT 160-4.5 MCG/ACT inhaler Inhale 2 puffs into the lungs 2 (two) times daily.  11/30/18   [provider]  zolpidem (AMBIEN) 10 MG tablet Take 10 mg by mouth at bedtime as needed for sleep.    [provider]    Allergies    Morphine and related, Aspirin, Clonidine derivatives, Tramadol, and Tylenol [acetaminophen]  Review of Systems   Review of Systems  Respiratory: Positive for shortness of breath.   Gastrointestinal: Positive for abdominal pain.  All other systems reviewed and are negative.   Physical Exam Updated Vital Signs BP (!) 161/91 (BP Location: Right Arm)    Pulse 79    Temp 98.8 F (37.1 C) (Oral)    Resp (!) 21    SpO2 95%   Physical Exam Vitals and nursing note reviewed.  Constitutional:      Appearance: He is well-developed.  HENT:     Head: Normocephalic and atraumatic.  Eyes:     Conjunctiva/sclera: Conjunctivae normal.     Pupils: Pupils are equal, round, and reactive to light.  Cardiovascular:     Rate and Rhythm: Normal rate and regular rhythm.     Heart  sounds: Normal heart sounds.  Pulmonary:     Effort: Pulmonary effort is normal.     Breath sounds: Normal breath sounds.  Abdominal:     General: Bowel sounds are normal.     Palpations: Abdomen is soft.     Comments: Chronically distended, no peritoneal signs  Musculoskeletal:        General: Normal range of motion.     Cervical back: Normal range of motion.     Comments: Fistula RUE, thrill noted  Skin:    General: Skin is warm and dry.  Neurological:     Mental Status: He is alert and oriented to person, place, and time.     ED Results / Procedures / Treatments   Labs (all labs ordered are listed, but only abnormal results are displayed) Labs Reviewed  BASIC METABOLIC PANEL - Abnormal; Notable for the following components:      Result Value   Potassium 6.0 (*)    Chloride 97 (*)    BUN 48 (*)    Creatinine, Ser 9.94 (*)    GFR calc non Af Amer 5 (*)  GFR calc Af Amer 6 (*)    Anion gap 17 (*)    All other components within normal limits  CBC - Abnormal; Notable for the following components:   RBC 3.58 (*)    Hemoglobin 10.0 (*)    HCT 28.6 (*)    MCV 79.9 (*)    Platelets 121 (*)    All other components within normal limits    EKG EKG Interpretation  Date/Time:  Thursday August 02 2019 00:33:19 EST Ventricular Rate:  79 PR Interval:    QRS Duration: 184 QT Interval:  495 QTC Calculation: 568 R Axis:   -135 Text Interpretation: Sinus rhythm Prolonged PR interval Consider left atrial enlargement Consider left ventricular hypertrophy Borderline T abnormalities, lateral leads Prolonged QT interval No significant change since last tracing Confirmed by Orpah Greek ([id number]) on 08/02/2019 1:45:23 AM   Radiology DG Chest Port 1 View  Result Date: 08/01/2019 CLINICAL DATA:  Shortness of breath. EXAM: PORTABLE CHEST 1 VIEW COMPARISON:  Radiograph 07/29/2019 FINDINGS: Stable cardiomegaly. Unchanged mediastinal contours with aortic atherosclerosis. Similar  vascular congestion. No large pleural effusion or focal airspace disease. No pneumothorax. No acute osseous abnormalities. IMPRESSION: Stable cardiomegaly and vascular congestion.  No new abnormality. Aortic Atherosclerosis (ICD10-I70.0). Electronically Signed   By: Keith Rake M.D.   On: 08/01/2019 02:38    Procedures Procedures (including critical care time)  CRITICAL CARE Performed by: Larene Pickett   Total critical care time: 35 minutes  Critical care time was exclusive of separately billable procedures and treating other patients.  Critical care was necessary to treat or prevent imminent or life-threatening deterioration.  Critical care was time spent personally by me on the following activities: development of treatment plan with patient and/or surrogate as well as nursing, discussions with consultants, evaluation of patient's response to treatment, examination of patient, obtaining history from patient or surrogate, ordering and performing treatments and interventions, ordering and review of laboratory studies, ordering and review of radiographic studies, pulse oximetry and re-evaluation of patient's condition.   Medications Ordered in ED Medications - No data to display  ED Course  I have reviewed the triage vital signs and the nursing notes.  Pertinent labs & imaging results that were available during my care of the patient were reviewed by me and considered in my medical decision making (see chart for details).    MDM Rules/Calculators/A&P  59 year old male presenting to the ED with shortness of breath.  Was at [facility] ED last night for same, found to be hyperkalemic at 6.4, given Lokelma and encouraged to have dialysis today.  He reports he went to his center and could not get treatment and then was told to come to the ER.  Patient is well-known to myself from prior visits, he is somewhat noncompliant with his dialysis treatments.  He did get treatment on Monday but  unable to tell me if this is full treatment or not.  Patient is hemodynamically stable here, oxygen saturations are stable on room air.  Also complains of abdominal pain, however this is chronic for him due to liver disease/ascites.  He has not had any fever or other infectious symptoms.  Doubt SBP or other acute/emergent pathology.  Labs today again with hyperkalemia of 6.0, however improved from yesterday.  Some minor t-wave changes on EKG.  Will discuss with nephology for recommendations.  12:45 AM Discussed with Dr. Royce Macadamia--- recommends Lokelma 10g now add 1g calcium gluconate.  She has discussed with dialysis staff-- it  appears patient did go to his center today, however was very late for treatment and refused to wait for afternoon shift.  Unsure if he can be added on to schedule for Thursday so will send COVID screen and arrange for dialysis this AM in hospital. Will not require admission for this.  6:09 AM Discussed with Dr. Royce Macadamia again-- due to his Hep B status, dialysis here will take longer to arrange and likely will result in shortened treatment.  His OP center has a slot for him today at 10am without delay and ability for full treatment so would be more beneficial to go there.  I have made patient aware of this, states he will go to his center today at 10am as scheduled.    He may return here for any new/acute changes.  Final Clinical Impression(s) / ED Diagnoses Final diagnoses:  ESRD on hemodialysis [facility])  Hyperkalemia    Rx / DC Orders ED Discharge Orders    None       Larene Pickett, PA-C 08/02/19 0617    Orpah Greek, MD 08/02/19 [phone]

## 2019-08-02 NOTE — ED Notes (Signed)
Breakfast ordered 

## 2019-08-02 NOTE — Discharge Instructions (Signed)
Go to your dialysis center at 10am today for make up dialysis session.  Please be on time so you do not miss treatment.

## 2019-08-02 NOTE — Plan of Care (Addendum)
Patient presented to ER overnight and found to have hyperkalemia.  Recurrent presentations to the ER.  Had received temporizing measures and was sent to outpatient unit [facility]) 1/20.  He is charted as arriving late and then refusing to stay to be put on treatment.   K 6.0.  Lokelma, bicarb, calcium ordered.    HD orders placed for first shift.  Have asked ER for updated covid test.  He is able to be discharged home after HD.  Will call [facility] early AM to see if they are able to accommodate however he has not followed through well in past.  Salvadore Oxford with nursing at [place] and HD unit here.  He can be dialyzed at [facility] with 10 am arrival in isolation room due to hep B status.  This is faster than he can be dialyzed inpatient. Spoke with ER PA and patient agrees to go to [place].  He left outpatient unit yesterday because he arrived during shift change and didn't want to wait.  Cuts treatments short.  Noncompliance increases his risk of adverse events including death and we have discussed with him  Claudia Desanctis 6:11 AM 08/02/2019

## 2019-08-02 NOTE — ED Notes (Signed)
Patient refusing to be placed on the vital signs machine.

## 2019-08-08 ENCOUNTER — Emergency Department (HOSPITAL_COMMUNITY)
Admission: EM | Admit: 2019-08-08 | Discharge: 2019-08-08 | Disposition: A | Discharge status: Home / Self Care | Payer: Medicare Other | Attending: Emergency Medicine | Admitting: Emergency Medicine

## 2019-08-08 ENCOUNTER — Emergency Department (HOSPITAL_COMMUNITY): Payer: Medicare Other

## 2019-08-08 ENCOUNTER — Telehealth: Payer: Self-pay | Admitting: Gastroenterology

## 2019-08-08 ENCOUNTER — Other Ambulatory Visit: Payer: Self-pay

## 2019-08-08 ENCOUNTER — Encounter (HOSPITAL_COMMUNITY): Payer: Self-pay | Admitting: Emergency Medicine

## 2019-08-08 DIAGNOSIS — I4891 Unspecified atrial fibrillation: Secondary | ICD-10-CM | POA: Diagnosis not present

## 2019-08-08 DIAGNOSIS — Z79899 Other long term (current) drug therapy: Secondary | ICD-10-CM | POA: Insufficient documentation

## 2019-08-08 DIAGNOSIS — K7460 Unspecified cirrhosis of liver: Secondary | ICD-10-CM

## 2019-08-08 DIAGNOSIS — R1084 Generalized abdominal pain: Secondary | ICD-10-CM

## 2019-08-08 DIAGNOSIS — R0602 Shortness of breath: Secondary | ICD-10-CM | POA: Insufficient documentation

## 2019-08-08 DIAGNOSIS — Z992 Dependence on renal dialysis: Secondary | ICD-10-CM | POA: Insufficient documentation

## 2019-08-08 DIAGNOSIS — J449 Chronic obstructive pulmonary disease, unspecified: Secondary | ICD-10-CM | POA: Diagnosis not present

## 2019-08-08 DIAGNOSIS — E875 Hyperkalemia: Secondary | ICD-10-CM | POA: Diagnosis not present

## 2019-08-08 DIAGNOSIS — I252 Old myocardial infarction: Secondary | ICD-10-CM | POA: Insufficient documentation

## 2019-08-08 DIAGNOSIS — N186 End stage renal disease: Secondary | ICD-10-CM | POA: Insufficient documentation

## 2019-08-08 DIAGNOSIS — F17210 Nicotine dependence, cigarettes, uncomplicated: Secondary | ICD-10-CM | POA: Diagnosis not present

## 2019-08-08 DIAGNOSIS — I2510 Atherosclerotic heart disease of native coronary artery without angina pectoris: Secondary | ICD-10-CM | POA: Insufficient documentation

## 2019-08-08 DIAGNOSIS — I120 Hypertensive chronic kidney disease with stage 5 chronic kidney disease or end stage renal disease: Secondary | ICD-10-CM | POA: Diagnosis not present

## 2019-08-08 DIAGNOSIS — R188 Other ascites: Secondary | ICD-10-CM

## 2019-08-08 LAB — COMPREHENSIVE METABOLIC PANEL
ALT: 12 U/L (ref 0–44)
AST: 23 U/L (ref 15–41)
Albumin: 3.4 g/dL — ABNORMAL LOW (ref 3.5–5.0)
Alkaline Phosphatase: 85 U/L (ref 38–126)
Anion gap: 12 (ref 5–15)
BUN: 28 mg/dL — ABNORMAL HIGH (ref 6–20)
CO2: 28 mmol/L (ref 22–32)
Calcium: 9.7 mg/dL (ref 8.9–10.3)
Chloride: 98 mmol/L (ref 98–111)
Creatinine, Ser: 7.84 mg/dL — ABNORMAL HIGH (ref 0.61–1.24)
GFR calc Af Amer: 8 mL/min — ABNORMAL LOW (ref >60)
GFR calc non Af Amer: 7 mL/min — ABNORMAL LOW (ref >60)
Glucose, Bld: 94 mg/dL (ref 70–99)
Potassium: 5.3 mmol/L — ABNORMAL HIGH (ref 3.5–5.1)
Sodium: 138 mmol/L (ref 135–145)
Total Bilirubin: 0.6 mg/dL (ref 0.3–1.2)
Total Protein: 6.7 g/dL (ref 6.5–8.1)

## 2019-08-08 LAB — CBC WITH DIFFERENTIAL/PLATELET
Abs Immature Granulocytes: 0.03 10*3/uL (ref 0.00–0.07)
Basophils Absolute: 0.1 10*3/uL (ref 0.0–0.1)
Basophils Relative: 1 %
Eosinophils Absolute: 0.1 10*3/uL (ref 0.0–0.5)
Eosinophils Relative: 1 %
HCT: 30.6 % — ABNORMAL LOW (ref 39.0–52.0)
Hemoglobin: 10.4 g/dL — ABNORMAL LOW (ref 13.0–17.0)
Immature Granulocytes: 0 %
Lymphocytes Relative: 16 %
Lymphs Abs: 1.3 10*3/uL (ref 0.7–4.0)
MCH: 27.5 pg (ref 26.0–34.0)
MCHC: 34 g/dL (ref 30.0–36.0)
MCV: 81 fL (ref 80.0–100.0)
Monocytes Absolute: 0.6 10*3/uL (ref 0.1–1.0)
Monocytes Relative: 8 %
Neutro Abs: 5.7 10*3/uL (ref 1.7–7.7)
Neutrophils Relative %: 74 %
Platelets: 170 10*3/uL (ref 150–400)
RBC: 3.78 MIL/uL — ABNORMAL LOW (ref 4.22–5.81)
RDW: 15.5 % (ref 11.5–15.5)
WBC: 7.8 10*3/uL (ref 4.0–10.5)
nRBC: 0 % (ref 0.0–0.2)

## 2019-08-08 LAB — TROPONIN I (HIGH SENSITIVITY): Troponin I (High Sensitivity): 33 ng/L — ABNORMAL HIGH (ref <18)

## 2019-08-08 LAB — LIPASE, BLOOD: Lipase: 21 U/L (ref 11–51)

## 2019-08-08 MED ORDER — SODIUM ZIRCONIUM CYCLOSILICATE 5 G PO PACK
5.0000 g | PACK | Freq: Once | ORAL | Status: DC
  Filled 2019-08-08: qty 1

## 2019-08-08 NOTE — Telephone Encounter (Signed)
Pls cal pt. He states that he is at the hospital at this moment for his dialysis and he wants to know if he can have paracentesis done today since he is there.

## 2019-08-08 NOTE — ED Notes (Signed)
Pt came out to nursing station and states Dialysis stated they can dialyze him today if he comes now. PA made aware.

## 2019-08-08 NOTE — ED Notes (Signed)
Pt sp02 100% on RA however, demanding to be on oxygen 2L [place].

## 2019-08-08 NOTE — ED Notes (Signed)
Got patient on the monitor did ekg shown to dr Alvino Chapel patient is resting with call bell in reach

## 2019-08-08 NOTE — ED Triage Notes (Signed)
Pt here VIA EMS with c/o abdominal pain and tachycardia.  Pt is a dialysis pt (MWF).  Last treatment was Monday (received partial treatment).    EMS vitals: BP 128/74 HR 80-90 Sp02 100% on 2L [place] Temp 98.3 RR 28

## 2019-08-08 NOTE — Telephone Encounter (Signed)
Per the last office note by Alonza Bogus the pt gets LVP every 2-3 weeks.  He has called stating he has ascites and need para ASAP.  Appt made at [facility] for 08/09/19 at 1 pm to arrive at 1245 pm.  The patient has been notified of this information and all questions answered.

## 2019-08-08 NOTE — ED Notes (Signed)
Patient Alert and oriented to baseline. Stable and ambulatory to baseline. Patient verbalized understanding of the discharge instructions.  Patient belongings were taken by the patient.   

## 2019-08-08 NOTE — ED Provider Notes (Addendum)
[facility] EMERGENCY DEPARTMENT Provider Note   CSN: [id number] Arrival date & time: 08/08/19  2703     History Chief Complaint  Patient presents with  . Abdominal Pain  . Shortness of Breath    [name] is a 59 y.o. male.  HPI  Patient is a 59 y.o. male with history of chronic A. fib on aspirin, end-stage renal disease on hemodialysis (schedule MWF at [facility]), chronic abdominal pain, COPD, cocaine abuse, hepatitis B and C, hypertension, presenting to the ED with shortness of breath and abdominal pain.   Patient presents today with shortness of breath, abdominal pain and some chest tightness that he states are all chronic issues with him that are unchanged today.  States that he was at dialysis center this morning approximately 8 AM when he started feeling like his heart was racing.  He states that he has not taken his amiodarone or any other medications this morning apart from his hydralazine.  Patient states that he frequently requires paracentesis that is done on an outpatient basis at [facility] for his ascites which causes him to feel short of breath.  Patient states that his shortness of breath has been progressively worsening as his abdomen is become more distended over the past several days.  Patient states that he has been compliant with his Monday Wednesday Friday schedule however on chart review there is some indication that patient does not adhere to his schedule.  Patient states his last dialysis was Monday but that he left in the early and was not fully dialyzed.   Patient denies any new shortness of breath, new abdominal pain, new chest pain and states that all the symptoms are unchanged apart from he felt that his heart was racing.   Patient states that the last time he experiences symptoms his potassium was very elevated.  He is concerned that this is the case today.  Would like to have his potassium checked.  Patient has any fevers,  nausea, vomiting, is no longer producing urine chronically due to CKD.  Denies any cough or congestion.     Past Medical History:  Diagnosis Date  . Anemia   . Anxiety   . Arthritis    left shoulder  . Atherosclerosis of aorta ([place])   . Cardiomegaly   . Chest pain    DATE UNKNOWN, C/O PERIODICALLY  . Cocaine abuse ([place])   . COPD exacerbation ([place]) 08/17/2016  . Coronary artery disease    stent 02/22/17  . ESRD (end stage renal disease) on dialysis ([place])    "[name]; MWF" (07/04/2017)  . GERD (gastroesophageal reflux disease)    DATE UNKNOWN  . Hemorrhoids   . Hepatitis B, chronic ([place])   . Hepatitis C   . History of kidney stones   . Hyperkalemia   . Hypertension   . Kidney failure   . Metabolic bone disease    Patient denies  . Mitral stenosis   . Myocardial infarction ([place])   . Pneumonia   . Pulmonary edema   . Solitary rectal ulcer syndrome 07/2017   at [facility] for rectal bleeding  . Tubular adenoma of colon     Patient Active Problem List   Diagnosis Date Noted  . Dyspnea 06/09/2019  . Acquired thrombophilia ([place]) 06/05/2019  . A-fib ([place]) 05/30/2019  . Atrial fibrillation with RVR ([place]) 05/29/2019  . Melena   . Pressure injury of skin 03/09/2019  . Abdominal distention   . Volume  overload 12/28/2018  . Sepsis ([place]) 09/12/2018  . Atherosclerosis of native coronary artery of native heart without angina pectoris 03/11/2018  . Benign neoplasm of cecum   . Benign neoplasm of ascending colon   . Benign neoplasm of descending colon   . Benign neoplasm of rectum   . Paroxysmal atrial fibrillation ([place]) 01/23/2018  . Hx of colonic polyps 01/20/2018  . ESRD (end stage renal disease) ([place]) 11/21/2017  . GERD (gastroesophageal reflux disease) 11/16/2017  . Liver cirrhosis ([place]) 11/15/2017  . DNR (do not resuscitate)   . Palliative care by specialist   . Hyponatremia 11/04/2017  . SBP (spontaneous bacterial peritonitis) ([place]) 10/30/2017  . Liver disease,  chronic 10/30/2017  . SOB (shortness of breath)   . Abdominal pain 10/28/2017  . Upper airway cough syndrome with flattening on f/v loop 10/13/17 c/w vcd 10/17/2017  . Elevated diaphragm 10/13/2017  . Ileus ([place]) 09/29/2017  . QT prolongation 09/29/2017  . Malnutrition of moderate degree 09/29/2017  . Sinus congestion 09/03/2017  . Symptomatic anemia 09/02/2017  . Other cirrhosis of liver ([place]) 09/02/2017  . Left bundle branch block 09/02/2017  . Mitral stenosis 09/02/2017  . Hematochezia 07/15/2017  . Wide-complex tachycardia ([place])   . Endotracheally intubated   . ESRD on dialysis ([place]) 07/04/2017  . Acute respiratory failure with hypoxia ([place]) 06/18/2017  . CKD (chronic kidney disease) stage V requiring chronic dialysis ([place]) 06/18/2017  . History of Cocaine abuse ([place]) 06/18/2017  . Hypertension 06/18/2017  . Infection of AV graft for dialysis ([place]) 06/18/2017  . Anxiety 06/18/2017  . Anemia due to chronic kidney disease 06/18/2017  . Atypical atrial flutter ([place]) 06/18/2017  . Personality disorder ([place]) 06/13/2017  . Cellulitis 06/12/2017  . Adjustment disorder with mixed anxiety and depressed mood 06/10/2017  . Suicidal ideation 06/10/2017  . Arm wound, left, sequela 06/10/2017  . Dyspnea on exertion 05/29/2017  . Tachycardia 05/29/2017  . Hyperkalemia 05/22/2017  . Acute metabolic encephalopathy   . Anemia 04/23/2017  . Ascites 04/23/2017  . COPD (chronic obstructive pulmonary disease) ([place]) 04/23/2017  . Acute on chronic respiratory failure with hypoxia ([place]) 03/25/2017  . Arrhythmia 03/25/2017  . COPD GOLD 0 with flattening on inps f/v  09/27/2016  . Essential hypertension 09/27/2016  . Fluid overload 08/30/2016  . COPD exacerbation ([place]) 08/17/2016  . Hypertensive urgency 08/17/2016  . Respiratory failure ([place]) 08/17/2016  . Problem with dialysis access ([place]) 07/23/2016  . Chronic hepatitis B ([place]) 03/05/2014  . Chronic hepatitis C without hepatic coma ([place])  03/05/2014  . Internal hemorrhoids with bleeding, swelling and itching 03/05/2014  . Thrombocytopenia ([place]) 03/05/2014  . Chest pain 02/27/2014  . Alcohol abuse 04/14/2009  . Cigarette smoker 04/14/2009  . GANGLION CYST 04/14/2009    Past Surgical History:  Procedure Laterality Date  . A/V FISTULAGRAM Left 05/26/2017   Procedure: A/V FISTULAGRAM;  Surgeon: Conrad Magnolia, MD;  Location: [place] [facility];  Service: Cardiovascular;  Laterality: Left;  . A/V FISTULAGRAM Right 11/18/2017   Procedure: A/V FISTULAGRAM - Right Arm;  Surgeon: Elam Dutch, MD;  Location: [place] CV LAB;  Service: Cardiovascular;  Laterality: Right;  . APPLICATION OF WOUND VAC Left 06/14/2017   Procedure: APPLICATION OF WOUND VAC;  Surgeon: Katha Cabal, MD;  Location: [facility] ORS;  Service: Vascular;  Laterality: Left;  . AV FISTULA PLACEMENT  2012   BELIEVED WAS PLACED IN JUNE  . AV FISTULA PLACEMENT Right 08/09/2017   Procedure: Creation Right arm ARTERIOVENOUS  BRACHIOCEPOHALIC FISTULA;  Surgeon: Elam Dutch, MD;  Location: [facility] OR;  Service: Vascular;  Laterality: Right;  . AV FISTULA PLACEMENT Right 11/22/2017   Procedure: INSERTION OF ARTERIOVENOUS (AV) GORE-TEX GRAFT RIGHT UPPER ARM;  Surgeon: Elam Dutch, MD;  Location: [place];  Service: Vascular;  Laterality: Right;  . BIOPSY  01/25/2018   Procedure: BIOPSY;  Surgeon: Jerene Bears, MD;  Location: [place];  Service: Gastroenterology;;  . BIOPSY  04/10/2019   Procedure: BIOPSY;  Surgeon: Jerene Bears, MD;  Location: [facility];  Service: Gastroenterology;;  . COLONOSCOPY    . COLONOSCOPY WITH PROPOFOL N/A 01/25/2018   Procedure: COLONOSCOPY WITH PROPOFOL;  Surgeon: Jerene Bears, MD;  Location: [place];  Service: Gastroenterology;  Laterality: N/A;  . CORONARY STENT INTERVENTION N/A 02/22/2017   Procedure: CORONARY STENT INTERVENTION;  Surgeon: Nigel Mormon, MD;  Location: [place] CV LAB;  Service: Cardiovascular;   Laterality: N/A;  . ESOPHAGOGASTRODUODENOSCOPY (EGD) WITH PROPOFOL N/A 01/25/2018   Procedure: ESOPHAGOGASTRODUODENOSCOPY (EGD) WITH PROPOFOL;  Surgeon: Jerene Bears, MD;  Location: [place];  Service: Gastroenterology;  Laterality: N/A;  . ESOPHAGOGASTRODUODENOSCOPY (EGD) WITH PROPOFOL N/A 04/10/2019   Procedure: ESOPHAGOGASTRODUODENOSCOPY (EGD) WITH PROPOFOL;  Surgeon: Jerene Bears, MD;  Location: [facility];  Service: Gastroenterology;  Laterality: N/A;  . FLEXIBLE SIGMOIDOSCOPY N/A 07/15/2017   Procedure: FLEXIBLE SIGMOIDOSCOPY;  Surgeon: Carol Ada, MD;  Location: [place];  Service: Endoscopy;  Laterality: N/A;  . HEMORRHOID BANDING    . I & D EXTREMITY Left 06/01/2017   Procedure: IRRIGATION AND DEBRIDEMENT LEFT ARM HEMATOMA WITH LIGATION OF LEFT ARM AV FISTULA;  Surgeon: Elam Dutch, MD;  Location: [place];  Service: Vascular;  Laterality: Left;  . I & D EXTREMITY Left 06/14/2017   Procedure: IRRIGATION AND DEBRIDEMENT EXTREMITY;  Surgeon: Katha Cabal, MD;  Location: [facility];  Service: Vascular;  Laterality: Left;  . INSERTION OF DIALYSIS CATHETER  05/30/2017  . INSERTION OF DIALYSIS CATHETER N/A 05/30/2017   Procedure: INSERTION OF DIALYSIS CATHETER;  Surgeon: Elam Dutch, MD;  Location: [place];  Service: Vascular;  Laterality: N/A;  . IR PARACENTESIS  08/30/2017  . IR PARACENTESIS  09/29/2017  . IR PARACENTESIS  10/28/2017  . IR PARACENTESIS  11/09/2017  . IR PARACENTESIS  11/16/2017  . IR PARACENTESIS  11/28/2017  . IR PARACENTESIS  12/01/2017  . IR PARACENTESIS  12/06/2017  . IR PARACENTESIS  01/03/2018  . IR PARACENTESIS  01/23/2018  . IR PARACENTESIS  02/07/2018  . IR PARACENTESIS  02/21/2018  . IR PARACENTESIS  03/06/2018  . IR PARACENTESIS  03/17/2018  . IR PARACENTESIS  04/04/2018  . IR PARACENTESIS  12/28/2018  . IR PARACENTESIS  01/08/2019  . IR PARACENTESIS  01/23/2019  . IR PARACENTESIS  02/01/2019  . IR PARACENTESIS  02/19/2019  . IR PARACENTESIS   03/01/2019  . IR PARACENTESIS  03/15/2019  . IR PARACENTESIS  04/03/2019  . IR PARACENTESIS  04/12/2019  . IR PARACENTESIS  05/01/2019  . IR PARACENTESIS  05/08/2019  . IR PARACENTESIS  05/24/2019  . IR PARACENTESIS  06/12/2019  . IR PARACENTESIS  07/09/2019  . IR PARACENTESIS  07/27/2019  . IR RADIOLOGIST EVAL & MGMT  02/14/2018  . IR RADIOLOGIST EVAL & MGMT  02/22/2019  . LEFT HEART CATH AND CORONARY ANGIOGRAPHY N/A 02/22/2017   Procedure: LEFT HEART CATH AND CORONARY ANGIOGRAPHY;  Surgeon: Nigel Mormon, MD;  Location: [place] CV LAB;  Service: Cardiovascular;  Laterality: N/A;  . LIGATION OF ARTERIOVENOUS  FISTULA Left 09/11/2023   Procedure: Plication of Left Arm Arteriovenous Fistula;  Surgeon: Elam Dutch, MD;  Location: [place];  Service: Vascular;  Laterality: Left;  . POLYPECTOMY    . POLYPECTOMY  01/25/2018   Procedure: POLYPECTOMY;  Surgeon: Jerene Bears, MD;  Location: [place];  Service: Gastroenterology;;  . REVISON OF ARTERIOVENOUS FISTULA Left 11/05/621   Procedure: PLICATION OF DISTAL ANEURYSMAL SEGEMENT OF LEFT UPPER ARM ARTERIOVENOUS FISTULA;  Surgeon: Elam Dutch, MD;  Location: [place];  Service: Vascular;  Laterality: Left;  . REVISON OF ARTERIOVENOUS FISTULA Left 7/62/8315   Procedure: Plication of Left Upper Arm Fistula ;  Surgeon: Waynetta Sandy, MD;  Location: [place];  Service: Vascular;  Laterality: Left;  . SKIN GRAFT SPLIT THICKNESS LEG / FOOT Left    SKIN GRAFT SPLIT THICKNESS LEFT ARM DONOR SITE: LEFT ANTERIOR THIGH  . SKIN SPLIT GRAFT Left 07/04/2017   Procedure: SKIN GRAFT SPLIT THICKNESS LEFT ARM DONOR SITE: LEFT ANTERIOR THIGH;  Surgeon: Elam Dutch, MD;  Location: [place];  Service: Vascular;  Laterality: Left;  . THROMBECTOMY W/ EMBOLECTOMY Left 06/05/2017   Procedure: EXPLORATION OF LEFT ARM FOR BLEEDING; OVERSEWED PROXIMAL FISTULA;  Surgeon: Angelia Mould, MD;  Location: [place];  Service: Vascular;  Laterality: Left;  .  WOUND EXPLORATION Left 06/03/2017   Procedure: WOUND EXPLORATION WITH WOUND VAC APPLICATION TO LEFT ARM;  Surgeon: Angelia Mould, MD;  Location: [facility] OR;  Service: Vascular;  Laterality: Left;       Family History  Problem Relation Age of Onset  . Heart disease Mother   . Lung cancer Mother   . Heart disease Father   . Malignant hyperthermia Father   . COPD Father   . Throat cancer Sister   . Esophageal cancer Sister   . Hypertension Other   . COPD Other   . Colon cancer Neg Hx   . Colon polyps Neg Hx   . Rectal cancer Neg Hx   . Stomach cancer Neg Hx     Social History   Tobacco Use  . Smoking status: Current Every Day Smoker    Packs/day: 0.50    Years: 43.00    Pack years: 21.50    Types: Cigarettes    Start date: 08/13/1973  . Smokeless tobacco: Never Used  . Tobacco comment: i dont know i just make   Substance Use Topics  . Alcohol use: Not Currently    Comment: quit drinking in 2017  . Drug use: Not Currently    Types: Marijuana, Cocaine    Comment: reports using once every 3 months, 04-06-2019 was this     Home Medications Prior to Admission medications   Medication Sig Start Date End Date Taking? Authorizing Provider  albuterol (VENTOLIN HFA) 108 (90 Base) MCG/ACT inhaler Inhale 2 puffs into the lungs every 6 (six) hours as needed for wheezing or shortness of breath.    [provider]  amiodarone (PACERONE) 200 MG tablet Take 1 tablet (200 mg total) by mouth daily. 07/22/19 08/21/19  Lucrezia Starch, MD  hydrALAZINE (APRESOLINE) 100 MG tablet Take 0.5 tablets (50 mg total) by mouth 3 (three) times daily. 05/31/19   [place], Carley D, DO  lactulose (CHRONULAC) 10 GM/15ML solution TAKE 45 MLS BY MOUTH 2 TIMES DAILY. Patient not taking: Reported on 08/01/2019 06/04/19   Al Decant, MD  loperamide (IMODIUM A-D) 2 MG tablet Take 2 mg  by mouth 4 (four) times daily as needed for diarrhea or loose stools.    [provider]  metoprolol  tartrate (LOPRESSOR) 100 MG tablet Take 1 tablet (100 mg total) by mouth 2 (two) times daily. 06/04/19   Al Decant, MD  montelukast (SINGULAIR) 10 MG tablet Take 10 mg by mouth every evening.     [provider]  ondansetron (ZOFRAN) 8 MG tablet Take 8 mg by mouth every 4 (four) hours as needed for nausea.  06/14/19   [provider]  oxyCODONE (OXY IR/ROXICODONE) 5 MG immediate release tablet Take 5 mg by mouth every 6 (six) hours as needed for moderate pain.  07/17/19   [provider]  pantoprazole (PROTONIX) 40 MG tablet Take 1 tablet (40 mg total) by mouth daily. Patient taking differently: Take 40 mg by mouth See admin instructions. Take 40 mg by mouth in the morning before breakfast and an additional 40 mg once a day, if symptoms persist 06/05/19   Maudie Mercury, MD  sevelamer carbonate (RENVELA) 800 MG tablet Take 4 tablets with meals  And 2 tablets twice daily with snacks Patient taking differently: Take 1,600-2,400 mg by mouth See admin instructions. Take 2,400 mg by mouth three times a day with meals and 1,600 mg with each snack 01/22/18   Patrecia Pour, MD  sodium zirconium cyclosilicate (LOKELMA) 5 g packet Take 5 g by mouth 2 (two) times daily. 08/01/19   Miquel Dunn, NP  sulfamethoxazole-trimethoprim (BACTRIM DS) 800-160 MG tablet Take 1 tablet by mouth daily. 12/30/18   Geradine Girt, DO  SYMBICORT 160-4.5 MCG/ACT inhaler Inhale 2 puffs into the lungs 2 (two) times daily.  11/30/18   [provider]  zolpidem (AMBIEN) 10 MG tablet Take 10 mg by mouth at bedtime as needed for sleep.    [provider]    Allergies    Morphine and related, Aspirin, Clonidine derivatives, Tramadol, and Tylenol [acetaminophen]  Review of Systems   Review of Systems  Constitutional: Negative for chills and fever.  HENT: Negative for congestion.   Eyes: Negative for pain.  Respiratory: Positive for shortness of breath. Negative for cough.     Cardiovascular: Positive for chest pain. Negative for leg swelling.  Gastrointestinal: Positive for abdominal pain. Negative for vomiting.  Genitourinary: Negative for dysuria.  Musculoskeletal: Negative for myalgias.  Skin: Negative for rash.  Neurological: Negative for dizziness and headaches.    Physical Exam Updated Vital Signs BP (!) 152/91   Pulse (!) 58   Temp 97.9 F (36.6 C) (Oral)   Resp 14   SpO2 100%   Physical Exam Vitals and nursing note reviewed.  Constitutional:      General: He is not in acute distress. HENT:     Head: Normocephalic and atraumatic.     Nose: Nose normal.     Mouth/Throat:     Mouth: Mucous membranes are moist.  Eyes:     General: Scleral icterus present.     Comments: Mild scleral icterus bilaterally  Cardiovascular:     Rate and Rhythm: Normal rate and regular rhythm.     Pulses: Normal pulses.     Heart sounds: Normal heart sounds.     Comments: Right arm with brachial fistula.  Good thrill.  No redness or tenderness palpation. Pulmonary:     Effort: Pulmonary effort is normal. No respiratory distress.     Breath sounds: No wheezing.  Abdominal:     General: There is distension.  Palpations: Abdomen is soft. There is no mass.     Tenderness: There is no abdominal tenderness. There is no right CVA tenderness, left CVA tenderness, guarding or rebound.     Hernia: No hernia is present.     Comments: Protuberant abdomen.  Nontender.  Positive fluid wave.  Is not warm to touch.  No guarding, rebound palpable mass.  No CVA tenderness  Musculoskeletal:     Cervical back: Normal range of motion.     Right lower leg: No edema.     Left lower leg: No edema.     Comments: Moves all 4 limbs.  Skin:    General: Skin is warm and dry.     Capillary Refill: Capillary refill takes less than 2 seconds.  Neurological:     Mental Status: He is alert. Mental status is at baseline.     Comments: Sensation intact all 4 extremities  Psychiatric:         Mood and Affect: Mood normal.        Behavior: Behavior normal.     ED Results / Procedures / Treatments   Labs (all labs ordered are listed, but only abnormal results are displayed) Labs Reviewed  COMPREHENSIVE METABOLIC PANEL - Abnormal; Notable for the following components:      Result Value   Potassium 5.3 (*)    BUN 28 (*)    Creatinine, Ser 7.84 (*)    Albumin 3.4 (*)    GFR calc non Af Amer 7 (*)    GFR calc Af Amer 8 (*)    All other components within normal limits  CBC WITH DIFFERENTIAL/PLATELET - Abnormal; Notable for the following components:   RBC 3.78 (*)    Hemoglobin 10.4 (*)    HCT 30.6 (*)    All other components within normal limits  TROPONIN I (HIGH SENSITIVITY) - Abnormal; Notable for the following components:   Troponin I (High Sensitivity) 33 (*)    All other components within normal limits  LIPASE, BLOOD  TROPONIN I (HIGH SENSITIVITY)    EKG EKG Interpretation  Date/Time:  Wednesday August 08 2019 08:43:14 EST Ventricular Rate:  103 PR Interval:    QRS Duration: 180 QT Interval:  334 QTC Calculation: 458 R Axis:   -57 Text Interpretation: Atrial fibrillation Ventricular premature complex Left bundle branch block No significant change since last tracing Confirmed by Davonna Belling [phone]) on 08/08/2019 8:46:10 AM   Radiology DG Chest Portable 1 View  Result Date: 08/08/2019 CLINICAL DATA:  Shortness of breath, abdominal pain, tachycardia. EXAM: PORTABLE CHEST 1 VIEW COMPARISON:  08/01/2019 and CT chest 05/07/2019. FINDINGS: Trachea is midline. Heart is enlarged. Thoracic aorta is calcified. There may be very mild interstitial prominence bilaterally. No definite peripheral septal lines. No airspace consolidation. No pleural fluid. IMPRESSION: 1. Possible slight interstitial prominence. Difficult to exclude pulmonary edema. 2.  Aortic atherosclerosis (ICD10-I70.0). Electronically Signed   By: Lorin Picket M.D.   On: 08/08/2019 10:31     Procedures Procedures (including critical care time)  Medications Ordered in ED Medications  sodium zirconium cyclosilicate (LOKELMA) packet 5 g (has no administration in time range)    ED Course  I have reviewed the triage vital signs and the nursing notes.  Pertinent labs & imaging results that were available during my care of the patient were reviewed by me and considered in my medical decision making (see chart for details).  Clinical Course as of Aug 07 1213  Wed Aug 08, 2019  0941 Call made to [facility] dialysis center arrange for follow-up dialysis appointment later today. Per staff patient will need to be at dialysis center by 10:45 in order for his dialysis to occur today. They stated that patient should call by 2:30 PM today if he wants to make an appointment for tomorrow and they will be able to fit him in.   [WF]  1214 Nursing staff made me aware that patient states that he is able to be seen at dialysis center today if he leaves immediately from ED.  This is reasonable on my opinion.  Patient had a mild hyperkalemia to be better treated by dialysis then 1 dose of Lokelma here.  Patient discharged.   [WF]    Clinical Course User Index [WF] Tedd Sias, [place]   MDM Rules/Calculators/A&P                      Patient is a 59 y.o. male with history of chronic A. fib on aspirin, end-stage renal disease on hemodialysis (schedule MWF at [facility]), chronic abdominal pain, COPD, cocaine abuse, hepatitis B and C, hypertension, presenting to the ED with shortness of breath and abdominal pain.   Patient is on a Monday Wednesday Friday dialysis schedule.  Last dialysis was Monday.  States that he was at his scheduled appointment this morning 8 AM when he started feeling like his heart was racing and some chest burning which she states is a symptom that he experiences in the past with high potassium.  He was concerned for this and left the  dialysis center to present to ED today.  Patient also endorses abdominal pain which is chronic for him and shortness of breath which is chronic as well worsening between his therapeutic paracentesis treatments.   Suspect that symptoms are likely due to high potassium.  However with patient's cardiac history will obtain troponins and EKG.  Patient appears to be in A. fib and tachycardic at this time.  I educated patient on the importance of compliance with dialysis appointments. He is understanding of this.   Patient is a generally poor historian is not sure how long he has had A. fib for.  Is unable to answer whether there is been a recent diagnosis or years ago.  My review of EMR he was seen by cardiology 08/01/2019.  He has no history of SBP and I doubt that he has SBP today.  Appears that he has paracentesis done approximately 2 weeks for ascites from his cirrhosis.  Troponin is WNL for patient actually lower than baseline.  Lipase within normal limits CBC unremarkable stable anemia likely due to CKD.  Potassium mildly elevated at 5.3.  Will give 1 dose of Lokelma.  BUN and creatinine within normal months per patient.  No other acute abnormalities. EKG independently reviewed by myself and discussed with Dr. Alvino Chapel.  Unremarkable unchanged from prior.  Patient is in A. fib.  This is normal for patient he is on aspirin for anticoagulation.  The medical records were personally reviewed by myself. I personally reviewed all lab results and interpreted all imaging studies and either concurred with their official read or contacted radiology for clarification.   This patient appears reasonably screened and I doubt any other medical condition requiring further workup, evaluation, or treatment in the ED at this time prior to discharge.   Patient's vitals are WNL apart from vital sign abnormalities discussed above, patient is in NAD, and able  to ambulate in the ED at their baseline and able to tolerate  PO.  Pain has been managed or a plan has been made for home management and has no complaints prior to discharge. Patient is comfortable with above plan and for discharge at this time. All questions were answered prior to disposition. Results from the ER workup discussed with the patient face to face and all questions answered to the best of my ability. The patient is safe for discharge with strict return precautions. Patient appears safe for discharge with appropriate follow-up. Conveyed my impression with the patient and they voiced understanding and are agreeable to plan.   An After Visit Summary was printed and given to the patient.  Portions of this note were generated with Lobbyist. Dictation errors may occur despite best attempts at proofreading.   I personally called dialysis center and handed phone to patient to make appointment for tomorrow.  Final Clinical Impression(s) / ED Diagnoses Final diagnoses:  Hyperkalemia  Generalized abdominal pain    Rx / DC Orders ED Discharge Orders    None       Tedd Sias, [place] 08/08/19 1130    Pati Gallo [place], [place] 08/08/19 1215    Davonna Belling, MD 08/08/19 1554

## 2019-08-08 NOTE — Discharge Instructions (Signed)
Please call your dialysis center.  I touch base with them today and they stated that they will be able to dialyze you tomorrow.  They requested that you call before 230 today to make the appointment tomorrow.  Your potassium was mildly elevated today.  We gave you 1 dose of Lokelma.

## 2019-08-09 ENCOUNTER — Ambulatory Visit (HOSPITAL_COMMUNITY)
Admission: RE | Admit: 2019-08-09 | Discharge: 2019-08-09 | Disposition: A | Discharge status: Home / Self Care | Payer: Medicare Other | Source: Ambulatory Visit | Attending: Family Medicine | Admitting: Family Medicine

## 2019-08-09 ENCOUNTER — Other Ambulatory Visit: Payer: Self-pay | Admitting: Internal Medicine

## 2019-08-09 ENCOUNTER — Other Ambulatory Visit: Payer: Self-pay

## 2019-08-09 DIAGNOSIS — R188 Other ascites: Secondary | ICD-10-CM | POA: Insufficient documentation

## 2019-08-09 DIAGNOSIS — K7460 Unspecified cirrhosis of liver: Secondary | ICD-10-CM | POA: Diagnosis not present

## 2019-08-09 HISTORY — PX: IR PARACENTESIS: IMG2679

## 2019-08-09 LAB — BODY FLUID CELL COUNT WITH DIFFERENTIAL
Eos, Fluid: 0 %
Lymphs, Fluid: 56 %
Monocyte-Macrophage-Serous Fluid: 42 % — ABNORMAL LOW (ref 50–90)
Neutrophil Count, Fluid: 2 % (ref 0–25)
Total Nucleated Cell Count, Fluid: 235 cu mm (ref 0–1000)

## 2019-08-09 IMAGING — US IR PARACENTESIS
1 series · 8 of 8 positions shown · non-contrast
Comparison: none

INDICATION: Recurrent ascites. History of chronic hepatitis C and B; ESRD on
dialysis. Request for diagnostic and therapeutic paracentesis.

[Series 1: ir (id) (id)/(id)/(id) ir · 8 of 8 slices shown]
[im 1/8]
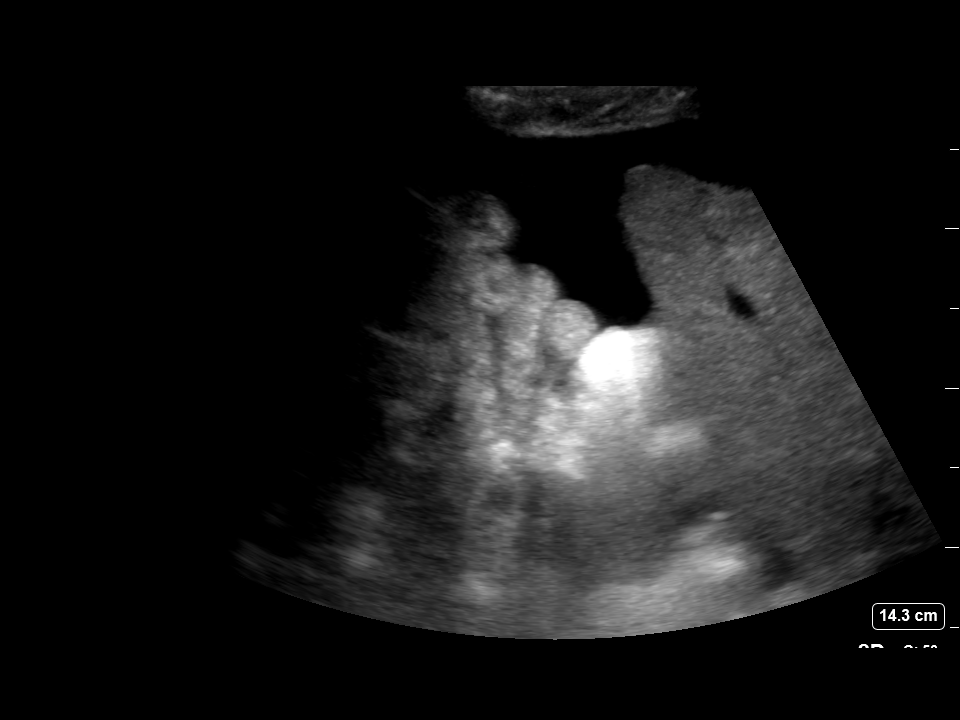
[im 2/8]
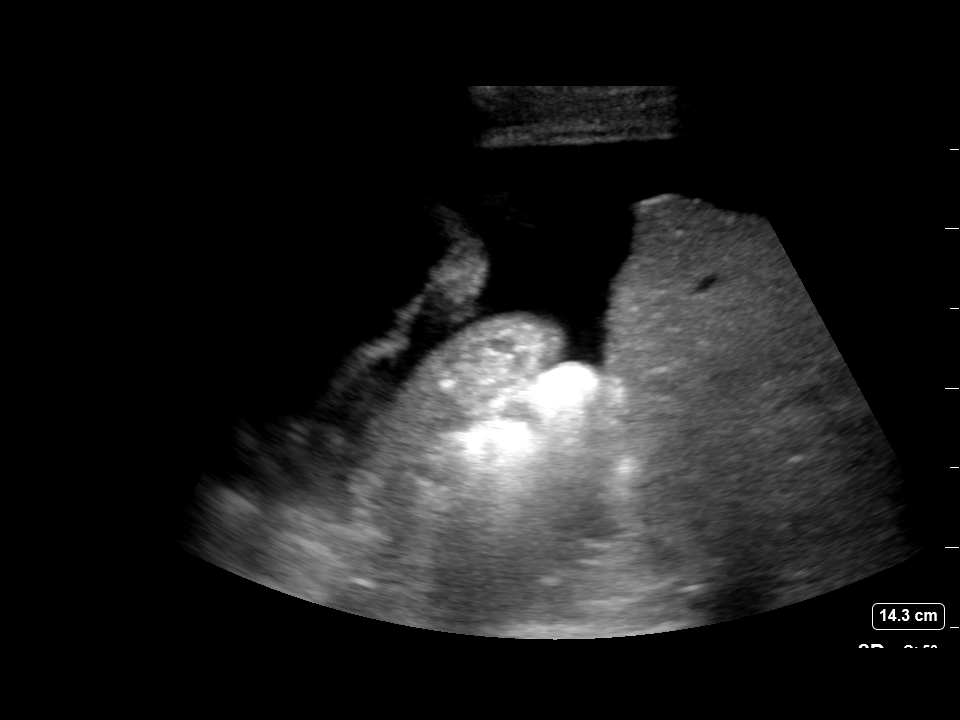
[im 3/8]
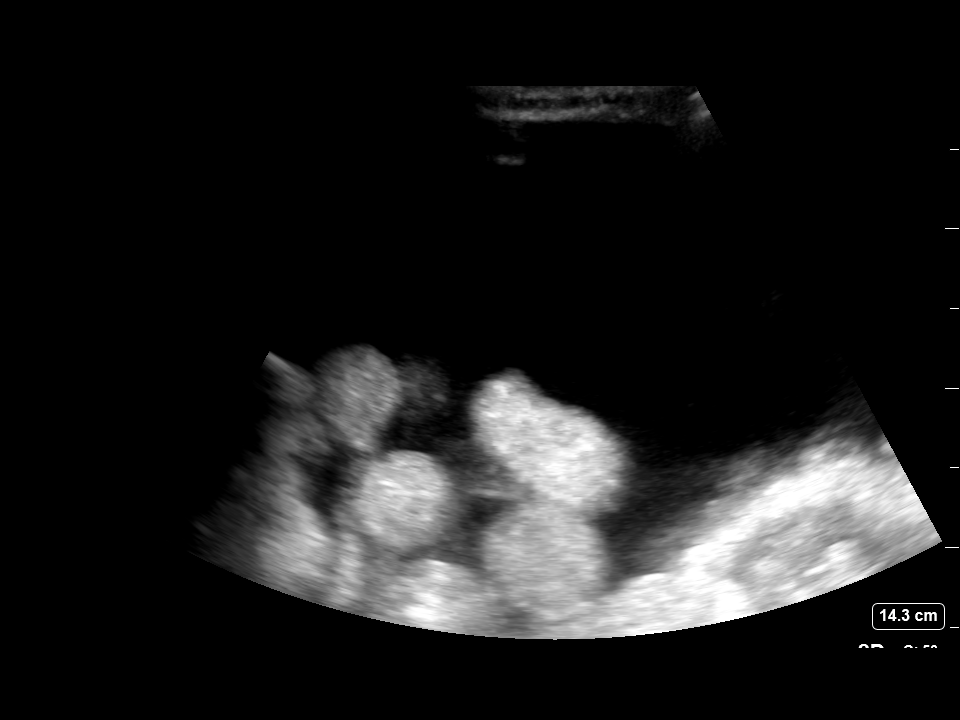
[im 4/8]
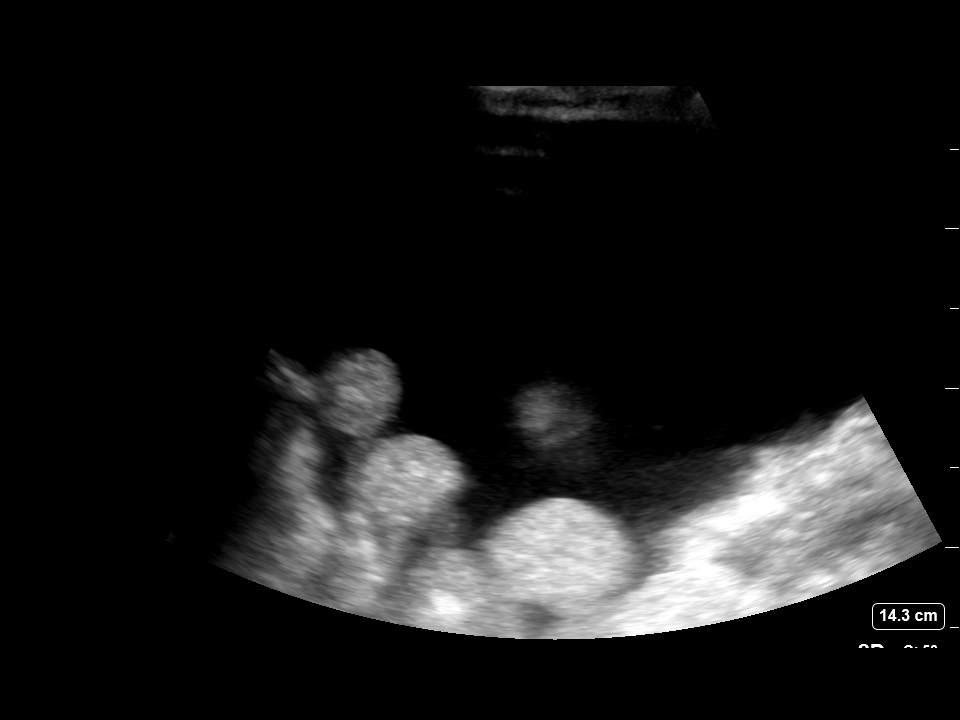
[im 5/8]
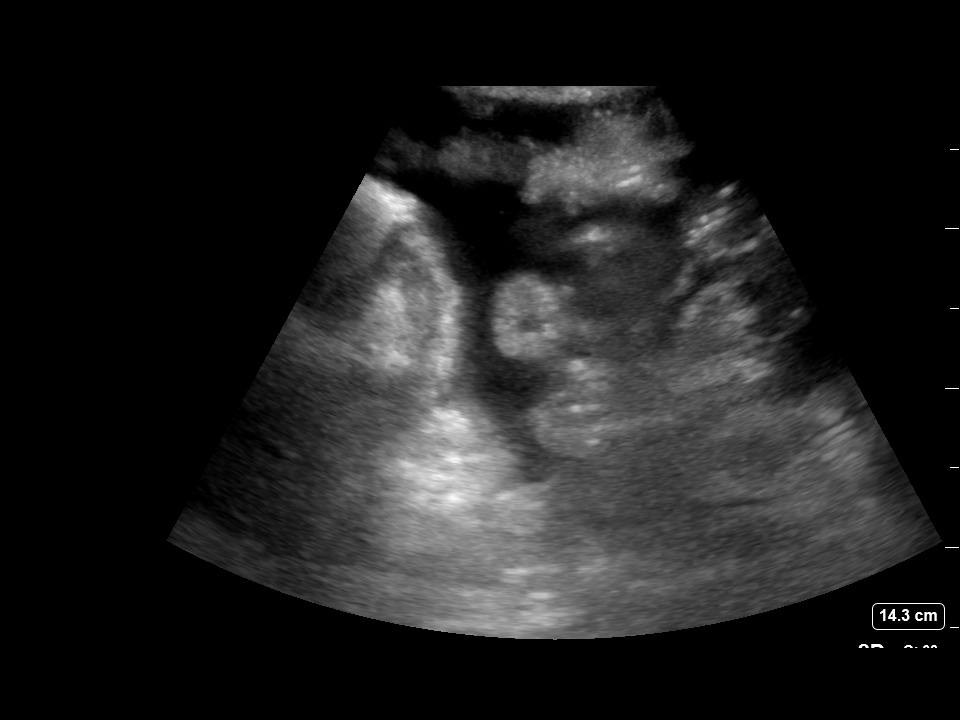
[im 6/8]
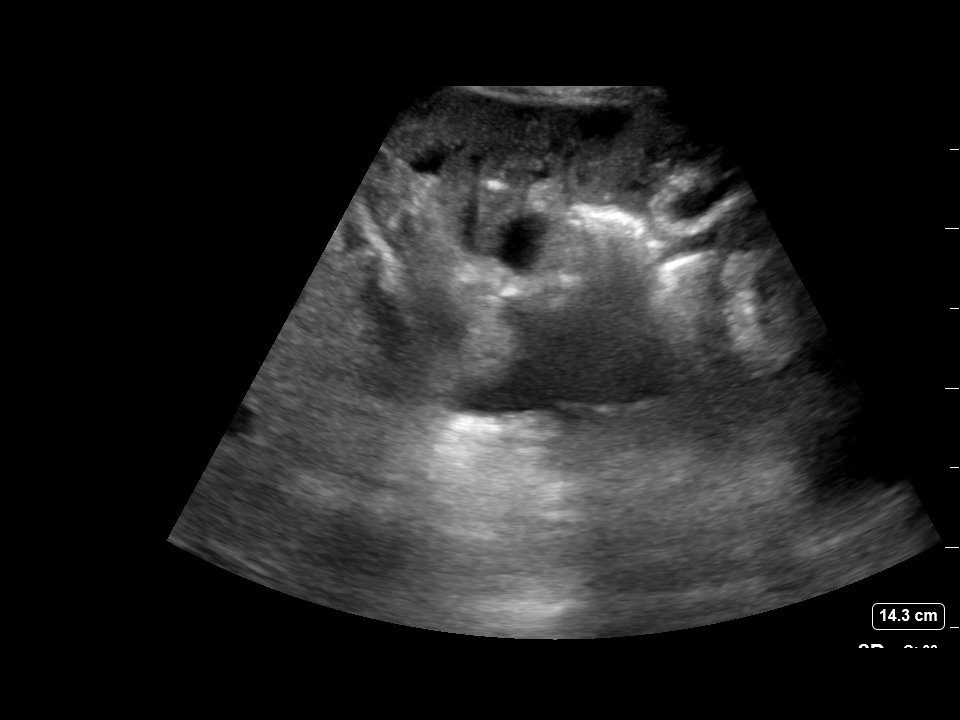
[im 7/8]
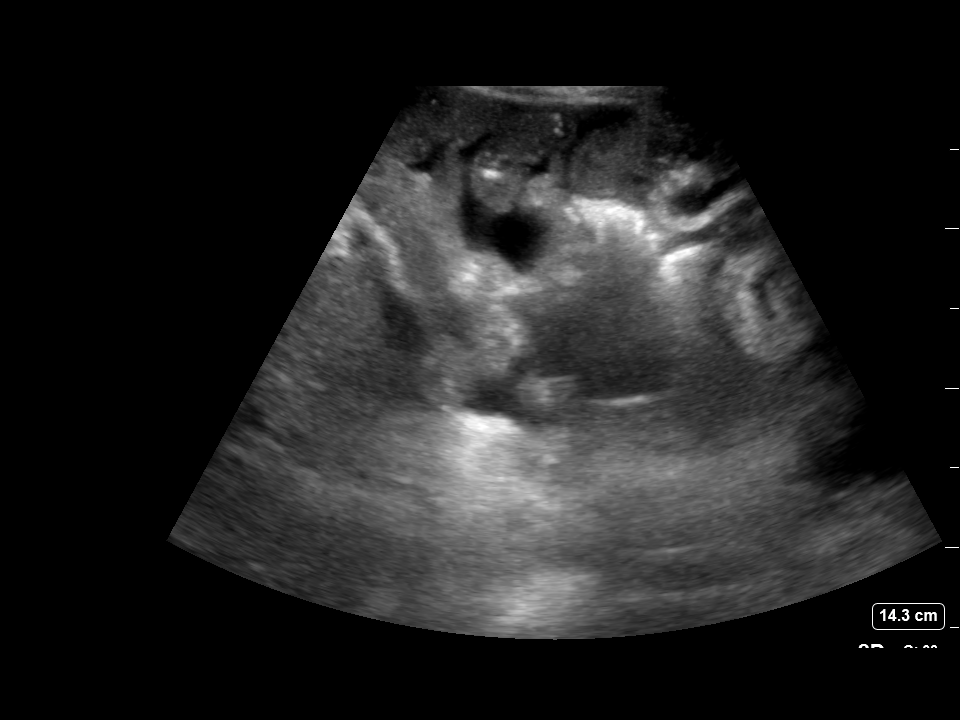
[im 8/8]
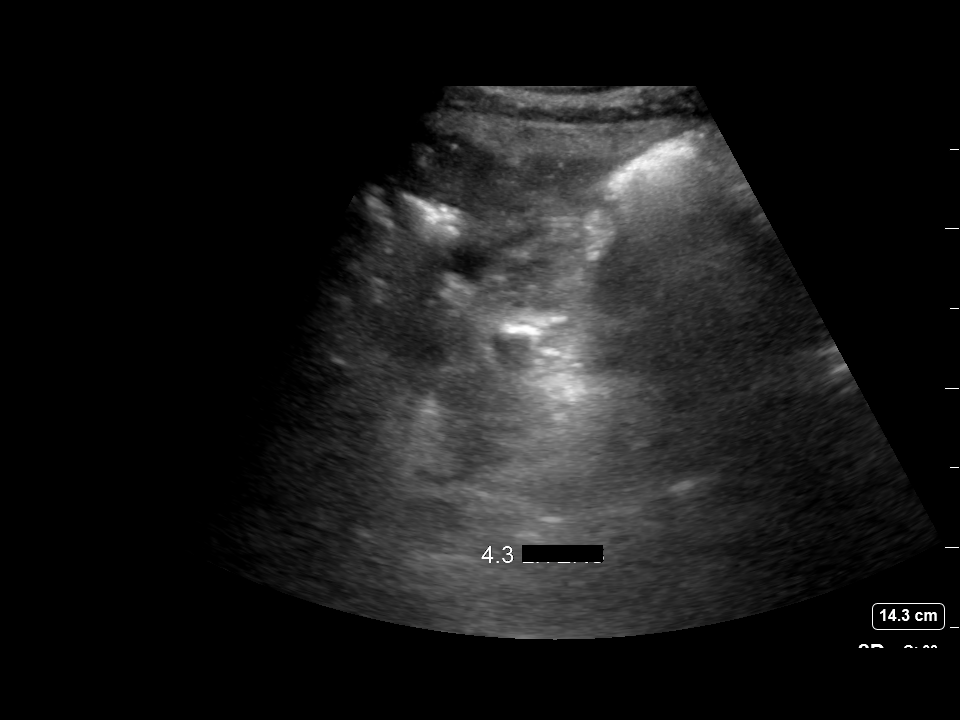

[8 of 8 positions shown; findings below may reference images not displayed]

EXAM:
ULTRASOUND GUIDED LEFT PARACENTESIS

MEDICATIONS:
15 mL 2% lidocaine.

COMPLICATIONS:
None immediate.

PROCEDURE:
Informed written consent was obtained from the patient after a
discussion of the risks, benefits and alternatives to treatment. A
timeout was performed prior to the initiation of the procedure.

Initial ultrasound scanning demonstrates a moderate amount of
ascites within the left lower abdominal quadrant. The left lower
abdomen was prepped and draped in the usual sterile fashion. 2%
lidocaine was used for local anesthesia.

Following this, a 6 Fr Safe-T-Centesis catheter was introduced. An
ultrasound image was saved for documentation purposes. The
paracentesis was performed. The catheter was removed and a dressing
was applied. The patient tolerated the procedure well without
immediate post procedural complication.
FINDINGS: A total of approximately 4.3L of yellow fluid was removed. Samples
were sent to the laboratory as requested by the clinical team.
IMPRESSION: Successful ultrasound-guided paracentesis yielding 4.3 liters of
peritoneal fluid.

Read by Kuntz, Deiziane

## 2019-08-09 MED ORDER — LIDOCAINE HCL 1 % IJ SOLN
INTRAMUSCULAR | Status: AC
  Filled 2019-08-09: qty 20

## 2019-08-09 MED ORDER — LIDOCAINE HCL (PF) 1 % IJ SOLN
INTRAMUSCULAR | Status: DC | PRN
  Administered 2019-08-09: 10 mL

## 2019-08-10 ENCOUNTER — Emergency Department (HOSPITAL_COMMUNITY): Payer: Medicare Other

## 2019-08-10 ENCOUNTER — Ambulatory Visit: Payer: Medicaid Other | Admitting: Internal Medicine

## 2019-08-10 ENCOUNTER — Emergency Department (HOSPITAL_COMMUNITY)
Admission: EM | Admit: 2019-08-10 | Discharge: 2019-08-10 | Disposition: A | Discharge status: Home / Self Care | Payer: Medicare Other | Attending: Emergency Medicine | Admitting: Emergency Medicine

## 2019-08-10 DIAGNOSIS — I120 Hypertensive chronic kidney disease with stage 5 chronic kidney disease or end stage renal disease: Secondary | ICD-10-CM | POA: Diagnosis not present

## 2019-08-10 DIAGNOSIS — B182 Chronic viral hepatitis C: Secondary | ICD-10-CM | POA: Diagnosis not present

## 2019-08-10 DIAGNOSIS — N186 End stage renal disease: Secondary | ICD-10-CM | POA: Insufficient documentation

## 2019-08-10 DIAGNOSIS — J449 Chronic obstructive pulmonary disease, unspecified: Secondary | ICD-10-CM | POA: Diagnosis not present

## 2019-08-10 DIAGNOSIS — E875 Hyperkalemia: Secondary | ICD-10-CM | POA: Insufficient documentation

## 2019-08-10 DIAGNOSIS — I4891 Unspecified atrial fibrillation: Secondary | ICD-10-CM | POA: Insufficient documentation

## 2019-08-10 DIAGNOSIS — Z9889 Other specified postprocedural states: Secondary | ICD-10-CM | POA: Diagnosis not present

## 2019-08-10 DIAGNOSIS — Z79899 Other long term (current) drug therapy: Secondary | ICD-10-CM | POA: Insufficient documentation

## 2019-08-10 DIAGNOSIS — F17210 Nicotine dependence, cigarettes, uncomplicated: Secondary | ICD-10-CM | POA: Diagnosis not present

## 2019-08-10 DIAGNOSIS — Z992 Dependence on renal dialysis: Secondary | ICD-10-CM | POA: Insufficient documentation

## 2019-08-10 DIAGNOSIS — I252 Old myocardial infarction: Secondary | ICD-10-CM | POA: Insufficient documentation

## 2019-08-10 DIAGNOSIS — R0602 Shortness of breath: Secondary | ICD-10-CM | POA: Diagnosis present

## 2019-08-10 LAB — BASIC METABOLIC PANEL
Anion gap: 14 (ref 5–15)
BUN: 30 mg/dL — ABNORMAL HIGH (ref 6–20)
CO2: 24 mmol/L (ref 22–32)
Calcium: 9.4 mg/dL (ref 8.9–10.3)
Chloride: 99 mmol/L (ref 98–111)
Creatinine, Ser: 7.76 mg/dL — ABNORMAL HIGH (ref 0.61–1.24)
GFR calc Af Amer: 8 mL/min — ABNORMAL LOW (ref >60)
GFR calc non Af Amer: 7 mL/min — ABNORMAL LOW (ref >60)
Glucose, Bld: 130 mg/dL — ABNORMAL HIGH (ref 70–99)
Potassium: 5.4 mmol/L — ABNORMAL HIGH (ref 3.5–5.1)
Sodium: 137 mmol/L (ref 135–145)

## 2019-08-10 LAB — CBC
HCT: 31.1 % — ABNORMAL LOW (ref 39.0–52.0)
Hemoglobin: 10.6 g/dL — ABNORMAL LOW (ref 13.0–17.0)
MCH: 27.5 pg (ref 26.0–34.0)
MCHC: 34.1 g/dL (ref 30.0–36.0)
MCV: 80.8 fL (ref 80.0–100.0)
Platelets: 156 10*3/uL (ref 150–400)
RBC: 3.85 MIL/uL — ABNORMAL LOW (ref 4.22–5.81)
RDW: 15.4 % (ref 11.5–15.5)
WBC: 6.5 10*3/uL (ref 4.0–10.5)
nRBC: 0 % (ref 0.0–0.2)

## 2019-08-10 LAB — TROPONIN I (HIGH SENSITIVITY)
Troponin I (High Sensitivity): 40 ng/L — ABNORMAL HIGH (ref <18)
Troponin I (High Sensitivity): 41 ng/L — ABNORMAL HIGH (ref <18)

## 2019-08-10 MED ORDER — OXYCODONE HCL 5 MG PO TABS
5.0000 mg | ORAL_TABLET | Freq: Once | ORAL | Status: AC
  Administered 2019-08-10: 5 mg via ORAL
  Filled 2019-08-10: qty 1

## 2019-08-10 NOTE — ED Provider Notes (Signed)
6:58 AM Cardiac monitoring reveals atrial fibrill (Rate & rhythm), as reviewed and interpreted by me. Cardiac monitoring was ordered due to V tach prehospital and to monitor patient for dysrhythmia.  This patient presented with acute onset of chest pain after developing ventricular tachycardia, he was initially more hypotensive, converted to atrial fibrillation and on arrival is feeling back to normal with a rhythm that is also back to normal.  The patient is a dialysis patient who last dialyzed on Wednesday, he is scheduled for this morning, he did have a paracentesis yesterday.  On exam the patient has a soft abdomen, mild distention, tenderness only over the puncture site on the left, clean dressing.  No edema, no distress, speaks in full sentences, clear lungs, mild systolic heart murmur.  The patient had a significant life-threatening arrhythmia this morning, he will need evaluation with labs and a consultation with cardiology.  Repeat EKG shows waht could be a high grade block.  7:33 PM Cardiac monitoring reveals AV block and bradycardia (Rate & rhythm), as reviewed and interpreted by me. Cardiac monitoring was ordered due to arrythmia and to monitor patient for dysrhythmia.  Procedures Cardiology consulted. Recommend dialysis - pt has essentially normal K Mild bradycardia likely related to amiodarone He is now back in the 70's in his baseline rhythym He does not want to be admitted - will arrange for outpt dialysis. 7:33 PM   Medical screening examination/treatment/procedure(s) were conducted as a shared visit with non-physician practitioner(s) and myself.  I personally evaluated the patient during the encounter.  Clinical Impression:   Final diagnoses:  Atrial fibrillation with RVR ([place])  Hyperkalemia         Noemi Chapel, MD 08/10/19 [phone]

## 2019-08-10 NOTE — Discharge Instructions (Signed)
Please go to dialysis at 12 pm today

## 2019-08-10 NOTE — ED Notes (Signed)
Pt HR noted to be in the 30's. EKG obtained and given to EDP.

## 2019-08-10 NOTE — ED Provider Notes (Signed)
[facility] EMERGENCY DEPARTMENT Provider Note   CSN: [id number] Arrival date & time: 08/10/19  6295     History Chief Complaint  Patient presents with  . Atrial Fibrillation    v tach     [name] is a 59 y.o. male with multiple medical problems and medical non-compliance who presents with palpitations and SOB. He states that he didn't sleep well last night because his side was hurting him. He had a therapeutic paracentesis done yesterday and 2L pulled off. He got up early this morning to go to the bathroom and then he acutely started to feel like his heart was racing and he was SOB. He called EMS - the initial EKG was concerning for V.tach with a rate of 160. He apparently self-converted to A.fib with intermittent runs of V.tach and they started him on an Amiodirone drip and he converted to SR. Pt denies any chest pain during this episode. The palpitations and SOB is better now as well. He is primarily concerned with his "side pain" where he had the paracentesis done yesterday.   Of note patient has been to the ED 10 times this month. He had a similar visit on Jan 17th and at that time case was discussed with Dr. Virgina Jock with Cards who states that pt actually has runs of A.fib with aberrancy rather than true V.tach and did not advised any further intervention.      HPI     Past Medical History:  Diagnosis Date  . Anemia   . Anxiety   . Arthritis    left shoulder  . Atherosclerosis of aorta ([place])   . Cardiomegaly   . Chest pain    DATE UNKNOWN, C/O PERIODICALLY  . Cocaine abuse ([place])   . COPD exacerbation ([place]) 08/17/2016  . Coronary artery disease    stent 02/22/17  . ESRD (end stage renal disease) on dialysis ([place])    "[name]; MWF" (07/04/2017)  . GERD (gastroesophageal reflux disease)    DATE UNKNOWN  . Hemorrhoids   . Hepatitis B, chronic ([place])   . Hepatitis C   . History of kidney stones   . Hyperkalemia   . Hypertension     . Kidney failure   . Metabolic bone disease    Patient denies  . Mitral stenosis   . Myocardial infarction ([place])   . Pneumonia   . Pulmonary edema   . Solitary rectal ulcer syndrome 07/2017   at flex sig for rectal bleeding  . Tubular adenoma of colon     Patient Active Problem List   Diagnosis Date Noted  . Dyspnea 06/09/2019  . Acquired thrombophilia ([place]) 06/05/2019  . A-fib ([place]) 05/30/2019  . Atrial fibrillation with RVR ([place]) 05/29/2019  . Melena   . Pressure injury of skin 03/09/2019  . Abdominal distention   . Volume overload 12/28/2018  . Sepsis ([place]) 09/12/2018  . Atherosclerosis of native coronary artery of native heart without angina pectoris 03/11/2018  . Benign neoplasm of cecum   . Benign neoplasm of ascending colon   . Benign neoplasm of descending colon   . Benign neoplasm of rectum   . Paroxysmal atrial fibrillation ([place]) 01/23/2018  . Hx of colonic polyps 01/20/2018  . ESRD (end stage renal disease) ([place]) 11/21/2017  . GERD (gastroesophageal reflux disease) 11/16/2017  . Liver cirrhosis ([place]) 11/15/2017  . DNR (do not resuscitate)   . Palliative care by specialist   . Hyponatremia 11/04/2017  . SBP (  spontaneous bacterial peritonitis) ([place]) 10/30/2017  . Liver disease, chronic 10/30/2017  . SOB (shortness of breath)   . Abdominal pain 10/28/2017  . Upper airway cough syndrome with flattening on f/v loop 10/13/17 c/w vcd 10/17/2017  . Elevated diaphragm 10/13/2017  . Ileus ([place]) 09/29/2017  . QT prolongation 09/29/2017  . Malnutrition of moderate degree 09/29/2017  . Sinus congestion 09/03/2017  . Symptomatic anemia 09/02/2017  . Other cirrhosis of liver ([place]) 09/02/2017  . Left bundle branch block 09/02/2017  . Mitral stenosis 09/02/2017  . Hematochezia 07/15/2017  . Wide-complex tachycardia ([place])   . Endotracheally intubated   . ESRD on dialysis ([place]) 07/04/2017  . Acute respiratory failure with hypoxia ([place]) 06/18/2017  . CKD (chronic kidney  disease) stage V requiring chronic dialysis ([place]) 06/18/2017  . History of Cocaine abuse ([place]) 06/18/2017  . Hypertension 06/18/2017  . Infection of AV graft for dialysis ([place]) 06/18/2017  . Anxiety 06/18/2017  . Anemia due to chronic kidney disease 06/18/2017  . Atypical atrial flutter ([place]) 06/18/2017  . Personality disorder ([place]) 06/13/2017  . Cellulitis 06/12/2017  . Adjustment disorder with mixed anxiety and depressed mood 06/10/2017  . Suicidal ideation 06/10/2017  . Arm wound, left, sequela 06/10/2017  . Dyspnea on exertion 05/29/2017  . Tachycardia 05/29/2017  . Hyperkalemia 05/22/2017  . Acute metabolic encephalopathy   . Anemia 04/23/2017  . Ascites 04/23/2017  . COPD (chronic obstructive pulmonary disease) ([place]) 04/23/2017  . Acute on chronic respiratory failure with hypoxia ([place]) 03/25/2017  . Arrhythmia 03/25/2017  . COPD GOLD 0 with flattening on inps f/v  09/27/2016  . Essential hypertension 09/27/2016  . Fluid overload 08/30/2016  . COPD exacerbation ([place]) 08/17/2016  . Hypertensive urgency 08/17/2016  . Respiratory failure ([place]) 08/17/2016  . Problem with dialysis access ([place]) 07/23/2016  . Chronic hepatitis B ([place]) 03/05/2014  . Chronic hepatitis C without hepatic coma ([place]) 03/05/2014  . Internal hemorrhoids with bleeding, swelling and itching 03/05/2014  . Thrombocytopenia ([place]) 03/05/2014  . Chest pain 02/27/2014  . Alcohol abuse 04/14/2009  . Cigarette smoker 04/14/2009  . GANGLION CYST 04/14/2009    Past Surgical History:  Procedure Laterality Date  . A/V FISTULAGRAM Left 05/26/2017   Procedure: A/V FISTULAGRAM;  Surgeon: Conrad Smithland, MD;  Location: [place] [facility];  Service: Cardiovascular;  Laterality: Left;  . A/V FISTULAGRAM Right 11/18/2017   Procedure: A/V FISTULAGRAM - Right Arm;  Surgeon: Elam Dutch, MD;  Location: [place] [facility];  Service: Cardiovascular;  Laterality: Right;  . APPLICATION OF WOUND VAC Left 06/14/2017    Procedure: APPLICATION OF WOUND VAC;  Surgeon: Katha Cabal, MD;  Location: [facility];  Service: Vascular;  Laterality: Left;  . AV FISTULA PLACEMENT  2012   BELIEVED WAS PLACED IN JUNE  . AV FISTULA PLACEMENT Right 08/09/2017   Procedure: Creation Right arm ARTERIOVENOUS BRACHIOCEPOHALIC FISTULA;  Surgeon: Elam Dutch, MD;  Location: [facility] OR;  Service: Vascular;  Laterality: Right;  . AV FISTULA PLACEMENT Right 11/22/2017   Procedure: INSERTION OF ARTERIOVENOUS (AV) GORE-TEX GRAFT RIGHT UPPER ARM;  Surgeon: Elam Dutch, MD;  Location: [place];  Service: Vascular;  Laterality: Right;  . BIOPSY  01/25/2018   Procedure: BIOPSY;  Surgeon: Jerene Bears, MD;  Location: [place];  Service: Gastroenterology;;  . BIOPSY  04/10/2019   Procedure: BIOPSY;  Surgeon: Jerene Bears, MD;  Location: [facility];  Service: Gastroenterology;;  . COLONOSCOPY    . COLONOSCOPY WITH PROPOFOL N/A  01/25/2018   Procedure: COLONOSCOPY WITH PROPOFOL;  Surgeon: Jerene Bears, MD;  Location: [place];  Service: Gastroenterology;  Laterality: N/A;  . CORONARY STENT INTERVENTION N/A 02/22/2017   Procedure: CORONARY STENT INTERVENTION;  Surgeon: Nigel Mormon, MD;  Location: [place] [facility];  Service: Cardiovascular;  Laterality: N/A;  . ESOPHAGOGASTRODUODENOSCOPY (EGD) WITH PROPOFOL N/A 01/25/2018   Procedure: ESOPHAGOGASTRODUODENOSCOPY (EGD) WITH PROPOFOL;  Surgeon: Jerene Bears, MD;  Location: [place];  Service: Gastroenterology;  Laterality: N/A;  . ESOPHAGOGASTRODUODENOSCOPY (EGD) WITH PROPOFOL N/A 04/10/2019   Procedure: ESOPHAGOGASTRODUODENOSCOPY (EGD) WITH PROPOFOL;  Surgeon: Jerene Bears, MD;  [facility]: [facility];  Service: Gastroenterology;  Laterality: N/A;  . FLEXIBLE SIGMOIDOSCOPY N/A 07/15/2017   Procedure: FLEXIBLE SIGMOIDOSCOPY;  Surgeon: Carol Ada, MD;  Location: [place];  Service: Endoscopy;  Laterality: N/A;  . HEMORRHOID BANDING    . I & D EXTREMITY Left  06/01/2017   Procedure: IRRIGATION AND DEBRIDEMENT LEFT ARM HEMATOMA WITH LIGATION OF LEFT ARM AV FISTULA;  Surgeon: Elam Dutch, MD;  Location: [place];  Service: Vascular;  Laterality: Left;  . I & D EXTREMITY Left 06/14/2017   Procedure: IRRIGATION AND DEBRIDEMENT EXTREMITY;  Surgeon: Katha Cabal, MD;  Location: [facility];  Service: Vascular;  Laterality: Left;  . INSERTION OF DIALYSIS CATHETER  05/30/2017  . INSERTION OF DIALYSIS CATHETER N/A 05/30/2017   Procedure: INSERTION OF DIALYSIS CATHETER;  Surgeon: Elam Dutch, MD;  Location: [place];  Service: Vascular;  Laterality: N/A;  . IR PARACENTESIS  08/30/2017  . IR PARACENTESIS  09/29/2017  . IR PARACENTESIS  10/28/2017  . IR PARACENTESIS  11/09/2017  . IR PARACENTESIS  11/16/2017  . IR PARACENTESIS  11/28/2017  . IR PARACENTESIS  12/01/2017  . IR PARACENTESIS  12/06/2017  . IR PARACENTESIS  01/03/2018  . IR PARACENTESIS  01/23/2018  . IR PARACENTESIS  02/07/2018  . IR PARACENTESIS  02/21/2018  . IR PARACENTESIS  03/06/2018  . IR PARACENTESIS  03/17/2018  . IR PARACENTESIS  04/04/2018  . IR PARACENTESIS  12/28/2018  . IR PARACENTESIS  01/08/2019  . IR PARACENTESIS  01/23/2019  . IR PARACENTESIS  02/01/2019  . IR PARACENTESIS  02/19/2019  . IR PARACENTESIS  03/01/2019  . IR PARACENTESIS  03/15/2019  . IR PARACENTESIS  04/03/2019  . IR PARACENTESIS  04/12/2019  . IR PARACENTESIS  05/01/2019  . IR PARACENTESIS  05/08/2019  . IR PARACENTESIS  05/24/2019  . IR PARACENTESIS  06/12/2019  . IR PARACENTESIS  07/09/2019  . IR PARACENTESIS  07/27/2019  . IR RADIOLOGIST EVAL & MGMT  02/14/2018  . IR RADIOLOGIST EVAL & MGMT  02/22/2019  . LEFT HEART CATH AND CORONARY ANGIOGRAPHY N/A 02/22/2017   Procedure: LEFT HEART CATH AND CORONARY ANGIOGRAPHY;  Surgeon: Nigel Mormon, MD;  Location: [place] [facility];  Service: Cardiovascular;  Laterality: N/A;  . LIGATION OF ARTERIOVENOUS  FISTULA Left 07/13/9415   Procedure: Plication of Left Arm  Arteriovenous Fistula;  Surgeon: Elam Dutch, MD;  Location: [place];  Service: Vascular;  Laterality: Left;  . POLYPECTOMY    . POLYPECTOMY  01/25/2018   Procedure: POLYPECTOMY;  Surgeon: Jerene Bears, MD;  Location: [place];  Service: Gastroenterology;;  . REVISON OF ARTERIOVENOUS FISTULA Left 10/18/1446   Procedure: PLICATION OF DISTAL ANEURYSMAL SEGEMENT OF LEFT UPPER ARM ARTERIOVENOUS FISTULA;  Surgeon: Elam Dutch, MD;  Location: [place];  Service: Vascular;  Laterality: Left;  . REVISON OF ARTERIOVENOUS FISTULA  Left 6/76/7209   Procedure: Plication of Left Upper Arm Fistula ;  Surgeon: Waynetta Sandy, MD;  Location: [place];  Service: Vascular;  Laterality: Left;  . SKIN GRAFT SPLIT THICKNESS LEG / FOOT Left    SKIN GRAFT SPLIT THICKNESS LEFT ARM DONOR SITE: LEFT ANTERIOR THIGH  . SKIN SPLIT GRAFT Left 07/04/2017   Procedure: SKIN GRAFT SPLIT THICKNESS LEFT ARM DONOR SITE: LEFT ANTERIOR THIGH;  Surgeon: Elam Dutch, MD;  Location: [place];  Service: Vascular;  Laterality: Left;  . THROMBECTOMY W/ EMBOLECTOMY Left 06/05/2017   Procedure: EXPLORATION OF LEFT ARM FOR BLEEDING; OVERSEWED PROXIMAL FISTULA;  Surgeon: Angelia Mould, MD;  Location: [place];  Service: Vascular;  Laterality: Left;  . WOUND EXPLORATION Left 06/03/2017   Procedure: WOUND EXPLORATION WITH WOUND VAC APPLICATION TO LEFT ARM;  Surgeon: Angelia Mould, MD;  Location[facility] OR;  Service: Vascular;  Laterality: Left;       Family History  Problem Relation Age of Onset  . Heart disease Mother   . Lung cancer Mother   . Heart disease Father   . Malignant hyperthermia Father   . COPD Father   . Throat cancer Sister   . Esophageal cancer Sister   . Hypertension Other   . COPD Other   . Colon cancer Neg Hx   . Colon polyps Neg Hx   . Rectal cancer Neg Hx   . Stomach cancer Neg Hx     Social History   Tobacco Use  . Smoking status: Current Every Day Smoker    Packs/day: 0.50      Years: 43.00    Pack years: 21.50    Types: Cigarettes    Start date: 08/13/1973  . Smokeless tobacco: Never Used  . Tobacco comment: i dont know i just make   Substance Use Topics  . Alcohol use: Not Currently    Comment: quit drinking in 2017  . Drug use: Not Currently    Types: Marijuana, Cocaine    Comment: reports using once every 3 months, 04-06-2019 was this     Home Medications Prior to Admission medications   Medication Sig Start Date End Date Taking? Authorizing Provider  albuterol (VENTOLIN HFA) 108 (90 Base) MCG/ACT inhaler Inhale 2 puffs into the lungs every 6 (six) hours as needed for wheezing or shortness of breath.    [provider]  amiodarone (PACERONE) 200 MG tablet Take 1 tablet (200 mg total) by mouth daily. 07/22/19 08/21/19  Lucrezia Starch, MD  hydrALAZINE (APRESOLINE) 100 MG tablet Take 0.5 tablets (50 mg total) by mouth 3 (three) times daily. 05/31/19   [place], Carley D, DO  lactulose (CHRONULAC) 10 GM/15ML solution TAKE 45 MLS BY MOUTH 2 TIMES DAILY. Patient not taking: Reported on 08/01/2019 06/04/19   Al Decant, MD  loperamide (IMODIUM A-D) 2 MG tablet Take 2 mg by mouth 4 (four) times daily as needed for diarrhea or loose stools.    [provider]  metoprolol tartrate (LOPRESSOR) 100 MG tablet Take 1 tablet (100 mg total) by mouth 2 (two) times daily. 06/04/19   Al Decant, MD  montelukast (SINGULAIR) 10 MG tablet Take 10 mg by mouth every evening.     [provider]  ondansetron (ZOFRAN) 8 MG tablet Take 8 mg by mouth every 4 (four) hours as needed for nausea.  06/14/19   [provider]  oxyCODONE (OXY IR/ROXICODONE) 5 MG immediate release tablet Take 5 mg by mouth every 6 (  six) hours as needed for moderate pain.  07/17/19   [provider]  pantoprazole (PROTONIX) 40 MG tablet Take 1 tablet (40 mg total) by mouth daily. Patient taking differently: Take 40 mg by mouth See admin instructions. Take 40  mg by mouth in the morning before breakfast and an additional 40 mg once a day, if symptoms persist 06/05/19   Maudie Mercury, MD  sevelamer carbonate (RENVELA) 800 MG tablet Take 4 tablets with meals  And 2 tablets twice daily with snacks Patient taking differently: Take 1,600-2,400 mg by mouth See admin instructions. Take 2,400 mg by mouth three times a day with meals and 1,600 mg with each snack 01/22/18   Patrecia Pour, MD  sodium zirconium cyclosilicate (LOKELMA) 5 g packet Take 5 g by mouth 2 (two) times daily. 08/01/19   Miquel Dunn, NP  sulfamethoxazole-trimethoprim (BACTRIM DS) 800-160 MG tablet Take 1 tablet by mouth daily. 12/30/18   Geradine Girt, DO  SYMBICORT 160-4.5 MCG/ACT inhaler Inhale 2 puffs into the lungs 2 (two) times daily.  11/30/18   [provider]  zolpidem (AMBIEN) 10 MG tablet Take 10 mg by mouth at bedtime as needed for sleep.    [provider]    Allergies    Morphine and related, Aspirin, Clonidine derivatives, Tramadol, and Tylenol [acetaminophen]  Review of Systems   Review of Systems  Constitutional: Positive for fatigue. Negative for chills and fever.  Respiratory: Positive for shortness of breath. Negative for cough and wheezing.   Cardiovascular: Positive for palpitations. Negative for chest pain.  Gastrointestinal: Positive for abdominal pain. Negative for nausea and vomiting.  Neurological: Negative for syncope and light-headedness.  All other systems reviewed and are negative.   Physical Exam Updated Vital Signs BP (!) 145/93   Pulse 80   Temp 98.1 F (36.7 C) (Oral)   Resp (!) 21   Ht 5\' 10"  (1.778 m)   Wt 68 kg   SpO2 100%   BMI 21.52 kg/m   Physical Exam Vitals and nursing note reviewed.  Constitutional:      General: He is not in acute distress.    Appearance: He is well-developed.     Comments: Chronically ill appearing male in NAD  HENT:     Head: Normocephalic and atraumatic.  Eyes:     General: No  scleral icterus.       Right eye: No discharge.        Left eye: No discharge.     Conjunctiva/sclera: Conjunctivae normal.     Pupils: Pupils are equal, round, and reactive to light.  Cardiovascular:     Rate and Rhythm: Normal rate and regular rhythm.     Heart sounds: Murmur present.     Comments: Fistula in LUE with palpable thrill Pulmonary:     Effort: Pulmonary effort is normal. No respiratory distress.     Breath sounds: Normal breath sounds.  Abdominal:     General: There is no distension.  Musculoskeletal:     Cervical back: Normal range of motion.  Skin:    General: Skin is warm and dry.  Neurological:     Mental Status: He is alert and oriented to person, place, and time.  Psychiatric:        Behavior: Behavior normal.     ED Results / Procedures / Treatments   Labs (all labs ordered are listed, but only abnormal results are displayed) Labs Reviewed  BASIC METABOLIC PANEL - Abnormal; Notable for  the following components:      Result Value   Potassium 5.4 (*)    Glucose, Bld 130 (*)    BUN 30 (*)    Creatinine, Ser 7.76 (*)    GFR calc non Af Amer 7 (*)    GFR calc Af Amer 8 (*)    All other components within normal limits  CBC - Abnormal; Notable for the following components:   RBC 3.85 (*)    Hemoglobin 10.6 (*)    HCT 31.1 (*)    All other components within normal limits  TROPONIN I (HIGH SENSITIVITY) - Abnormal; Notable for the following components:   Troponin I (High Sensitivity) 40 (*)    All other components within normal limits  TROPONIN I (HIGH SENSITIVITY) - Abnormal; Notable for the following components:   Troponin I (High Sensitivity) 41 (*)    All other components within normal limits    EKG EKG Interpretation  Date/Time:  Friday August 10 2019 06:27:46 EST Ventricular Rate:  81 PR Interval:    QRS Duration: 180 QT Interval:  483 QTC Calculation: 561 R Axis:   -82 Text Interpretation: Sinus rhythm Left bundle branch block No  significant change since last tracing Confirmed by Pryor Curia [phone]) on 08/10/2019 6:31:43 AM    EKG Interpretation  Date/Time:  Friday August 10 2019 08:36:24 EST Ventricular Rate:  39 PR Interval:    QRS Duration: 181 QT Interval:  588 QTC Calculation: 474 R Axis:   -74 Text Interpretation: Predominant 4:1 AV block Left bundle branch block  now had AV block Since last tracing rate slower and rhytym has changed Confirmed by Noemi Chapel [phone]) on 08/10/2019 8:39:16 AM        Radiology DG Chest Portable 1 View  Result Date: 08/08/2019 CLINICAL DATA:  Shortness of breath, abdominal pain, tachycardia. EXAM: PORTABLE CHEST 1 VIEW COMPARISON:  08/01/2019 and CT chest 05/07/2019. FINDINGS: Trachea is midline. Heart is enlarged. Thoracic aorta is calcified. There may be very mild interstitial prominence bilaterally. No definite peripheral septal lines. No airspace consolidation. No pleural fluid. IMPRESSION: 1. Possible slight interstitial prominence. Difficult to exclude pulmonary edema. 2.  Aortic atherosclerosis (ICD10-I70.0). Electronically Signed   By: Lorin Picket M.D.   On: 08/08/2019 10:31    Procedures Procedures (including critical care time)  Medications Ordered in ED Medications  oxyCODONE (Oxy IR/ROXICODONE) immediate release tablet 5 mg (5 mg Oral Given 08/10/19 0754)    ED Course  I have reviewed the triage vital signs and the nursing notes.  Pertinent labs & imaging results that were available during my care of the patient were reviewed by me and considered in my medical decision making (see chart for details).  59 year old male presents with palpitations and shortness of breath.  When EMS responded to the scene initial rhythm was concerning for V. tach and he was started on amiodarone.  Patient is in rate controlled A. fib on my evaluation.  He is chronically ill-appearing but states symptoms have resolved.  Now he is only having "side pain" from when he had a  paracentesis done yesterday.  He does have a history of chronic pain and goes to pain management and is prescribed narcotics.  Will obtain labs and give him a dose of his home pain medicine here.  Labs are overall at baseline.  Potassium is minimally elevated to 5.4 today.  Initial and second troponin are elevated but flat (40 and 41 respectively) and around his baseline.   8:41  AM HR was noted to be in the 30s by nursing. Another EKG was captured which shows a 4:1 AV block. Will discuss with Cards  Discussed with Dr. Virgina Jock with Cards who is familiar with the pt. He feels that new bradycardia is consistent with Atrial flutter with conduction delay due to Amiodirone. He recommends as long as pt is hemodynamically stable he would d/c from Cards standpoint and encourage patient to go to dialysis.  Discussed with Dr. Jonnie Finner with nephrology who is also familiar with the patient.  He will arrange dialysis at noon today at his regular dialysis center.  Discussed results with the patient.  He is very comfortable appearing often sleeping when I enter the exam room.  He is asking for food, drink, cab voucher.  Will discharge to dialysis.    MDM Rules/Calculators/A&P  7:00 AM Cardiac monitoring reveals sinus rhythm at 80 BPM, as reviewed and interpreted by me. Cardiac monitoring was ordered due to hx of A.fib and V.tach on an amiodarone drip and to monitor patient for dysrhythmia.    Final Clinical Impression(s) / ED Diagnoses Final diagnoses:  Atrial fibrillation with RVR ([place])  Hyperkalemia    Rx / DC Orders ED Discharge Orders    None       Recardo Evangelist, PA-C 08/10/19 1022    Noemi Chapel, MD 08/10/19 [phone]

## 2019-08-10 NOTE — ED Notes (Signed)
EDP aware of pt HR in the 30s

## 2019-08-10 NOTE — Progress Notes (Signed)
Afib RVR with underlying IVCD episode, resolved with amiodarone bolus. Currently, he is in atrial flutter with 4:1 AV conduction, hemodynamically stable and asymptomatic. Does not need any further cardiac intervention. He has h/o missing dialysis sessions and coming to the ED with hyperkalemia and/or Afib w/RVR episodes. Outpatient dialysis session was due at 8 AM today, which he will likely miss. Consider outpatient dialysis later today/tomorrow or inpatient dialysis today.  Nigel Mormon, MD [facility] Pager: [phone] Office: [phone]

## 2019-08-10 NOTE — ED Triage Notes (Signed)
Pt arrives via [facility] from home.   Pt called out for EMS for SOB and chest pain. Upon EMS arrival pt was in V tach at a rate of 160 for an estimation of 15 minutes, BP 98/68. Pt self converted into a fib. With multiple runs of v tach. Amiodarone drip started.  Pt arrives in no apparent distress.  A&ox4  Pt was scheduled for dialysis today.

## 2019-08-10 NOTE — ED Notes (Signed)
Pt d/c home per MD order. Discharge summary reviewed, verbalizes understanding, aware dialysis at 57. [employer] taxi called, taxi voucher provided.

## 2019-08-12 ENCOUNTER — Other Ambulatory Visit: Payer: Self-pay

## 2019-08-12 ENCOUNTER — Encounter (HOSPITAL_COMMUNITY): Payer: Self-pay | Admitting: Emergency Medicine

## 2019-08-12 ENCOUNTER — Emergency Department (HOSPITAL_COMMUNITY)
Admission: EM | Admit: 2019-08-12 | Discharge: 2019-08-12 | Disposition: A | Discharge status: Home / Self Care | Payer: Medicare Other | Attending: Emergency Medicine | Admitting: Emergency Medicine

## 2019-08-12 DIAGNOSIS — R002 Palpitations: Secondary | ICD-10-CM | POA: Diagnosis present

## 2019-08-12 DIAGNOSIS — N186 End stage renal disease: Secondary | ICD-10-CM | POA: Diagnosis not present

## 2019-08-12 DIAGNOSIS — I482 Chronic atrial fibrillation, unspecified: Secondary | ICD-10-CM | POA: Diagnosis present

## 2019-08-12 DIAGNOSIS — I480 Paroxysmal atrial fibrillation: Secondary | ICD-10-CM | POA: Diagnosis not present

## 2019-08-12 DIAGNOSIS — Z992 Dependence on renal dialysis: Secondary | ICD-10-CM

## 2019-08-12 DIAGNOSIS — F17210 Nicotine dependence, cigarettes, uncomplicated: Secondary | ICD-10-CM | POA: Insufficient documentation

## 2019-08-12 DIAGNOSIS — F1210 Cannabis abuse, uncomplicated: Secondary | ICD-10-CM | POA: Diagnosis not present

## 2019-08-12 DIAGNOSIS — R000 Tachycardia, unspecified: Secondary | ICD-10-CM

## 2019-08-12 DIAGNOSIS — Z79899 Other long term (current) drug therapy: Secondary | ICD-10-CM | POA: Diagnosis not present

## 2019-08-12 DIAGNOSIS — K7460 Unspecified cirrhosis of liver: Secondary | ICD-10-CM | POA: Diagnosis present

## 2019-08-12 DIAGNOSIS — J449 Chronic obstructive pulmonary disease, unspecified: Secondary | ICD-10-CM | POA: Insufficient documentation

## 2019-08-12 DIAGNOSIS — K7290 Hepatic failure, unspecified without coma: Secondary | ICD-10-CM | POA: Diagnosis present

## 2019-08-12 DIAGNOSIS — N189 Chronic kidney disease, unspecified: Secondary | ICD-10-CM

## 2019-08-12 DIAGNOSIS — I472 Ventricular tachycardia: Secondary | ICD-10-CM | POA: Diagnosis not present

## 2019-08-12 DIAGNOSIS — I120 Hypertensive chronic kidney disease with stage 5 chronic kidney disease or end stage renal disease: Secondary | ICD-10-CM | POA: Insufficient documentation

## 2019-08-12 DIAGNOSIS — D631 Anemia in chronic kidney disease: Secondary | ICD-10-CM

## 2019-08-12 DIAGNOSIS — I4891 Unspecified atrial fibrillation: Secondary | ICD-10-CM | POA: Diagnosis present

## 2019-08-12 LAB — CBC WITH DIFFERENTIAL/PLATELET
Abs Immature Granulocytes: 0.03 10*3/uL (ref 0.00–0.07)
Basophils Absolute: 0.1 10*3/uL (ref 0.0–0.1)
Basophils Relative: 1 %
Eosinophils Absolute: 0.1 10*3/uL (ref 0.0–0.5)
Eosinophils Relative: 2 %
HCT: 28.4 % — ABNORMAL LOW (ref 39.0–52.0)
Hemoglobin: 9.8 g/dL — ABNORMAL LOW (ref 13.0–17.0)
Immature Granulocytes: 0 %
Lymphocytes Relative: 24 %
Lymphs Abs: 1.7 10*3/uL (ref 0.7–4.0)
MCH: 27.9 pg (ref 26.0–34.0)
MCHC: 34.5 g/dL (ref 30.0–36.0)
MCV: 80.9 fL (ref 80.0–100.0)
Monocytes Absolute: 0.4 10*3/uL (ref 0.1–1.0)
Monocytes Relative: 6 %
Neutro Abs: 4.8 10*3/uL (ref 1.7–7.7)
Neutrophils Relative %: 67 %
Platelets: 161 10*3/uL (ref 150–400)
RBC: 3.51 MIL/uL — ABNORMAL LOW (ref 4.22–5.81)
RDW: 15.6 % — ABNORMAL HIGH (ref 11.5–15.5)
WBC: 7.1 10*3/uL (ref 4.0–10.5)
nRBC: 0 % (ref 0.0–0.2)

## 2019-08-12 LAB — BASIC METABOLIC PANEL
Anion gap: 15 (ref 5–15)
BUN: 23 mg/dL — ABNORMAL HIGH (ref 6–20)
CO2: 20 mmol/L — ABNORMAL LOW (ref 22–32)
Calcium: 8 mg/dL — ABNORMAL LOW (ref 8.9–10.3)
Chloride: 106 mmol/L (ref 98–111)
Creatinine, Ser: 6.12 mg/dL — ABNORMAL HIGH (ref 0.61–1.24)
GFR calc Af Amer: 11 mL/min — ABNORMAL LOW (ref >60)
GFR calc non Af Amer: 9 mL/min — ABNORMAL LOW (ref >60)
Glucose, Bld: 119 mg/dL — ABNORMAL HIGH (ref 70–99)
Potassium: 4.1 mmol/L (ref 3.5–5.1)
Sodium: 141 mmol/L (ref 135–145)

## 2019-08-12 LAB — CBG MONITORING, ED: Glucose-Capillary: 122 mg/dL — ABNORMAL HIGH (ref 70–99)

## 2019-08-12 LAB — MAGNESIUM: Magnesium: 1.6 mg/dL — ABNORMAL LOW (ref 1.7–2.4)

## 2019-08-12 MED ORDER — MAGNESIUM SULFATE 2 GM/50ML IV SOLN
2.0000 g | Freq: Once | INTRAVENOUS | Status: AC
  Administered 2019-08-12: 2 g via INTRAVENOUS
  Filled 2019-08-12: qty 50

## 2019-08-12 MED ORDER — PROPOFOL 10 MG/ML IV BOLUS
INTRAVENOUS | Status: AC | PRN
  Administered 2019-08-12: 35 mg via INTRAVENOUS

## 2019-08-12 NOTE — Code Documentation (Signed)
Cardiac shock given by Dr. Roxanne Mins for cardiovesion 120 J pt converted to SR on the monitor after the shock.

## 2019-08-12 NOTE — ED Triage Notes (Signed)
HD pt brought to ED by [facility] from home for c/o palpitation, denies any SOB or cp, pt on VT on EMS arrival 150 Amion given by EMS pta with no changes. BP 107/75, no other vitals gotten from EMS>

## 2019-08-12 NOTE — ED Provider Notes (Signed)
Patient care discussed with Dr. Lorin Mercy.  She has consulted with cardiology who knows patient well and he is seen here frequently.  They advised that there is no further therapy that patient requires.  Will discharge to home.   Pattricia Boss, MD 08/12/19 [phone]

## 2019-08-12 NOTE — Consult Note (Addendum)
ER Consult Note   [name] LFY:[id number] DOB: 28-Mar-1961 DOA: 08/12/2019  PCP: [name], FNP Consultants:  Einar Gip - cardiology; Pyrtle - GI; nephrology Patient coming from:  Home; NOK: [place], [place], [phone]  Chief Complaint: palpitations  HPI: [name] is a 59 y.o. male with medical history significant of ESRD, medical noncompliance, hepatitis B and C with decompensated cirrhosis, ascites requiring regular paracentesis, chronic diastolic CHF, COPD, hx PAF presenting with palpitations.  He was last admitted 1/5-6 with hyperkalemia due to non-compliance with HD.  He presented on 1/10 to the ER with wide complex tachycardia and was given Amiodarone and discharged to home.  He returned on 1/11 with SOB and was discharged to go directly to HD.  He had paracentesis on 1/15.  He returned on 1/17 with SOB and wide-complex tachycardia; he was given [facility] and discharged.  He returned later that day and was eventually dialyzed and discharged.  His next ER visit was on 1/20 for tachycardia, improved with adenosine; he was given [facility] and discharged.  He returned again the following day, on 1/21, with SOB; he was discharged to go directly to HD.  His next visit was on 1/27 and he was again discharged directly to HD.  He returned on 1/29; cardiology was consulted and recommended HD but the patient preferred outpatient HD.  He has had 26 ER evaluations and 6 admissions in the last 6 months.  At the time of my evaluation, he reported that he was feeling well and without complaint and felt like he was okay for discharge but "if you think I should come in, I'll come in."   ED Course:  Vtach.  ESRD, cirrhosis, afib.  Cardioverted in the ER.  Seen 10 days ago by Dr. Irven Shelling NP.  Needs consult from cards.  Review of Systems: As per HPI; otherwise review of systems reviewed and negative.   Ambulatory Status:  Ambulates without assistance  Past Medical History:    Diagnosis Date  . Anemia   . Anxiety   . Arthritis    left shoulder  . Atherosclerosis of aorta ([place])   . Cardiomegaly   . Chest pain    DATE UNKNOWN, C/O PERIODICALLY  . Cocaine abuse ([place])   . COPD exacerbation ([place]) 08/17/2016  . Coronary artery disease    stent 02/22/17  . ESRD (end stage renal disease) on dialysis ([place])    "[name]; MWF" (07/04/2017)  . GERD (gastroesophageal reflux disease)    DATE UNKNOWN  . Hemorrhoids   . Hepatitis B, chronic ([place])   . Hepatitis C   . History of kidney stones   . Hyperkalemia   . Hypertension   . Kidney failure   . Metabolic bone disease    Patient denies  . Mitral stenosis   . Myocardial infarction ([place])   . Pneumonia   . Pulmonary edema   . Solitary rectal ulcer syndrome 07/2017   at flex sig for rectal bleeding  . Tubular adenoma of colon     Past Surgical History:  Procedure Laterality Date  . A/V FISTULAGRAM Left 05/26/2017   Procedure: A/V FISTULAGRAM;  Surgeon: Conrad Diomede, MD;  Location: [place] [facility];  Service: Cardiovascular;  Laterality: Left;  . A/V FISTULAGRAM Right 11/18/2017   Procedure: A/V FISTULAGRAM - Right Arm;  Surgeon: Elam Dutch, MD;  Location: [place] [facility];  Service: Cardiovascular;  Laterality: Right;  . APPLICATION OF WOUND VAC Left 06/14/2017  Procedure: APPLICATION OF WOUND VAC;  Surgeon: Katha Cabal, MD;  Location: [facility];  Service: Vascular;  Laterality: Left;  . AV FISTULA PLACEMENT  2012   BELIEVED WAS PLACED IN JUNE  . AV FISTULA PLACEMENT Right 08/09/2017   Procedure: Creation Right arm ARTERIOVENOUS BRACHIOCEPOHALIC FISTULA;  Surgeon: Elam Dutch, MD;  Location: [facility] OR;  Service: Vascular;  Laterality: Right;  . AV FISTULA PLACEMENT Right 11/22/2017   Procedure: INSERTION OF ARTERIOVENOUS (AV) GORE-TEX GRAFT RIGHT UPPER ARM;  Surgeon: Elam Dutch, MD;  Location: [place];  Service: Vascular;  Laterality: Right;  . BIOPSY  01/25/2018   Procedure: BIOPSY;   Surgeon: Jerene Bears, MD;  Location: [place];  Service: Gastroenterology;;  . BIOPSY  04/10/2019   Procedure: BIOPSY;  Surgeon: Jerene Bears, MD;  Location: [facility];  Service: Gastroenterology;;  . COLONOSCOPY    . COLONOSCOPY WITH PROPOFOL N/A 01/25/2018   Procedure: COLONOSCOPY WITH PROPOFOL;  Surgeon: Jerene Bears, MD;  Location: [place];  Service: Gastroenterology;  Laterality: N/A;  . CORONARY STENT INTERVENTION N/A 02/22/2017   Procedure: CORONARY STENT INTERVENTION;  Surgeon: Nigel Mormon, MD;  Location: [place] [facility];  Service: Cardiovascular;  Laterality: N/A;  . ESOPHAGOGASTRODUODENOSCOPY (EGD) WITH PROPOFOL N/A 01/25/2018   Procedure: ESOPHAGOGASTRODUODENOSCOPY (EGD) WITH PROPOFOL;  Surgeon: Jerene Bears, MD;  Location: [place];  Service: Gastroenterology;  Laterality: N/A;  . ESOPHAGOGASTRODUODENOSCOPY (EGD) WITH PROPOFOL N/A 04/10/2019   Procedure: ESOPHAGOGASTRODUODENOSCOPY (EGD) WITH PROPOFOL;  Surgeon: Jerene Bears, MD;  Location: [facility];  Service: Gastroenterology;  Laterality: N/A;  . FLEXIBLE SIGMOIDOSCOPY N/A 07/15/2017   Procedure: FLEXIBLE SIGMOIDOSCOPY;  Surgeon: Carol Ada, MD;  Location: [place];  Service: Endoscopy;  Laterality: N/A;  . HEMORRHOID BANDING    . I & D EXTREMITY Left 06/01/2017   Procedure: IRRIGATION AND DEBRIDEMENT LEFT ARM HEMATOMA WITH LIGATION OF LEFT ARM AV FISTULA;  Surgeon: Elam Dutch, MD;  Location: [place];  Service: Vascular;  Laterality: Left;  . I & D EXTREMITY Left 06/14/2017   Procedure: IRRIGATION AND DEBRIDEMENT EXTREMITY;  Surgeon: Katha Cabal, MD;  Location: [facility];  Service: Vascular;  Laterality: Left;  . INSERTION OF DIALYSIS CATHETER  05/30/2017  . INSERTION OF DIALYSIS CATHETER N/A 05/30/2017   Procedure: INSERTION OF DIALYSIS CATHETER;  Surgeon: Elam Dutch, MD;  Location: [place];  Service: Vascular;  Laterality: N/A;  . IR PARACENTESIS  08/30/2017  . IR PARACENTESIS   09/29/2017  . IR PARACENTESIS  10/28/2017  . IR PARACENTESIS  11/09/2017  . IR PARACENTESIS  11/16/2017  . IR PARACENTESIS  11/28/2017  . IR PARACENTESIS  12/01/2017  . IR PARACENTESIS  12/06/2017  . IR PARACENTESIS  01/03/2018  . IR PARACENTESIS  01/23/2018  . IR PARACENTESIS  02/07/2018  . IR PARACENTESIS  02/21/2018  . IR PARACENTESIS  03/06/2018  . IR PARACENTESIS  03/17/2018  . IR PARACENTESIS  04/04/2018  . IR PARACENTESIS  12/28/2018  . IR PARACENTESIS  01/08/2019  . IR PARACENTESIS  01/23/2019  . IR PARACENTESIS  02/01/2019  . IR PARACENTESIS  02/19/2019  . IR PARACENTESIS  03/01/2019  . IR PARACENTESIS  03/15/2019  . IR PARACENTESIS  04/03/2019  . IR PARACENTESIS  04/12/2019  . IR PARACENTESIS  05/01/2019  . IR PARACENTESIS  05/08/2019  . IR PARACENTESIS  05/24/2019  . IR PARACENTESIS  06/12/2019  . IR PARACENTESIS  07/09/2019  . IR PARACENTESIS  07/27/2019  . IR  PARACENTESIS  08/09/2019  . IR RADIOLOGIST EVAL & MGMT  02/14/2018  . IR RADIOLOGIST EVAL & MGMT  02/22/2019  . LEFT HEART CATH AND CORONARY ANGIOGRAPHY N/A 02/22/2017   Procedure: LEFT HEART CATH AND CORONARY ANGIOGRAPHY;  Surgeon: Nigel Mormon, MD;  Location: [place] [facility];  Service: Cardiovascular;  Laterality: N/A;  . LIGATION OF ARTERIOVENOUS  FISTULA Left 02/12/6628   Procedure: Plication of Left Arm Arteriovenous Fistula;  Surgeon: Elam Dutch, MD;  Location: [place];  Service: Vascular;  Laterality: Left;  . POLYPECTOMY    . POLYPECTOMY  01/25/2018   Procedure: POLYPECTOMY;  Surgeon: Jerene Bears, MD;  Location: [place];  Service: Gastroenterology;;  . REVISON OF ARTERIOVENOUS FISTULA Left 4/76/5465   Procedure: PLICATION OF DISTAL ANEURYSMAL SEGEMENT OF LEFT UPPER ARM ARTERIOVENOUS FISTULA;  Surgeon: Elam Dutch, MD;  Location: [place];  Service: Vascular;  Laterality: Left;  . REVISON OF ARTERIOVENOUS FISTULA Left 0/35/4656   Procedure: Plication of Left Upper Arm Fistula ;  Surgeon: Waynetta Sandy,  MD;  Location: [place];  Service: Vascular;  Laterality: Left;  . SKIN GRAFT SPLIT THICKNESS LEG / FOOT Left    SKIN GRAFT SPLIT THICKNESS LEFT ARM DONOR SITE: LEFT ANTERIOR THIGH  . SKIN SPLIT GRAFT Left 07/04/2017   Procedure: SKIN GRAFT SPLIT THICKNESS LEFT ARM DONOR SITE: LEFT ANTERIOR THIGH;  Surgeon: Elam Dutch, MD;  Location: [place];  Service: Vascular;  Laterality: Left;  . THROMBECTOMY W/ EMBOLECTOMY Left 06/05/2017   Procedure: EXPLORATION OF LEFT ARM FOR BLEEDING; OVERSEWED PROXIMAL FISTULA;  Surgeon: Angelia Mould, MD;  Location: [place];  Service: Vascular;  Laterality: Left;  . WOUND EXPLORATION Left 06/03/2017   Procedure: WOUND EXPLORATION WITH WOUND VAC APPLICATION TO LEFT ARM;  Surgeon: Angelia Mould, MD;  Location: [facility] OR;  Service: Vascular;  Laterality: Left;    Social History   Socioeconomic History  . Marital status: Single    Spouse name: Not on file  . Number of children: 3  . Years of education: 10  . Highest education level: Not on file  Occupational History  . Occupation: Unemployed  Tobacco Use  . Smoking status: Current Every Day Smoker    Packs/day: 0.50    Years: 43.00    Pack years: 21.50    Types: Cigarettes    Start date: 08/13/1973  . Smokeless tobacco: Never Used  . Tobacco comment: i dont know i just make   Substance and Sexual Activity  . Alcohol use: Not Currently    Comment: quit drinking in 2017  . Drug use: Not Currently    Types: Marijuana, Cocaine    Comment: reports using once every 3 months, 04-06-2019 was this   . Sexual activity: Not on file  Other Topics Concern  . Not on file  Social History Narrative   Lives alone   Caffeine use: Coffee-rare   Soda- daily      [facility] (03/10/17):   Originally from [facility]. Previously worked trimming trees. No pets currently. No bird or mold exposure.    Social Determinants of Health   Financial Resource Strain:   . Difficulty of Paying Living Expenses: Not on  file  Food Insecurity:   . Worried About Charity fundraiser in the Last Year: Not on file  . Ran Out of Food in the Last Year: Not on file  Transportation Needs:   . Lack of Transportation (Medical): Not on file  .  Lack of Transportation (Non-Medical): Not on file  Physical Activity:   . Days of Exercise per Week: Not on file  . Minutes of Exercise per Session: Not on file  Stress:   . Feeling of Stress : Not on file  Social Connections:   . Frequency of Communication with Friends and Family: Not on file  . Frequency of Social Gatherings with Friends and Family: Not on file  . Attends Religious Services: Not on file  . Active Member of Clubs or Organizations: Not on file  . Attends Archivist Meetings: Not on file  . Marital Status: Not on file  Intimate Partner Violence:   . Fear of Current or Ex-Partner: Not on file  . Emotionally Abused: Not on file  . Physically Abused: Not on file  . Sexually Abused: Not on file    Allergies  Allergen Reactions  . Morphine And Related Other (See Comments)    Stomach pain  . Aspirin Other (See Comments)    STOMACH PAIN  . Clonidine Derivatives Itching  . Tramadol Itching  . Tylenol [Acetaminophen] Nausea Only    Stomach ache    Family History  Problem Relation Age of Onset  . Heart disease Mother   . Lung cancer Mother   . Heart disease Father   . Malignant hyperthermia Father   . COPD Father   . Throat cancer Sister   . Esophageal cancer Sister   . Hypertension Other   . COPD Other   . Colon cancer Neg Hx   . Colon polyps Neg Hx   . Rectal cancer Neg Hx   . Stomach cancer Neg Hx     Prior to Admission medications   Medication Sig Start Date End Date Taking? Authorizing Provider  albuterol (VENTOLIN HFA) 108 (90 Base) MCG/ACT inhaler Inhale 2 puffs into the lungs every 6 (six) hours as needed for wheezing or shortness of breath.    [provider]  amiodarone (PACERONE) 200 MG tablet Take 1 tablet (200  mg total) by mouth daily. 07/22/19 08/21/19  Lucrezia Starch, MD  hydrALAZINE (APRESOLINE) 100 MG tablet Take 0.5 tablets (50 mg total) by mouth 3 (three) times daily. Patient taking differently: Take 100 mg by mouth 3 (three) times daily.  05/31/19   [place], Carley D, DO  lactulose (CHRONULAC) 10 GM/15ML solution TAKE 45 MLS BY MOUTH 2 TIMES DAILY. Patient not taking: Reported on 08/01/2019 06/04/19   Al Decant, MD  loperamide (IMODIUM A-D) 2 MG tablet Take 2 mg by mouth 4 (four) times daily as needed for diarrhea or loose stools.    [provider]  metoprolol tartrate (LOPRESSOR) 100 MG tablet Take 1 tablet (100 mg total) by mouth 2 (two) times daily. 06/04/19   Al Decant, MD  montelukast (SINGULAIR) 10 MG tablet Take 10 mg by mouth every evening.     [provider]  ondansetron (ZOFRAN) 8 MG tablet Take 8 mg by mouth every 4 (four) hours as needed for nausea.  06/14/19   [provider]  pantoprazole (PROTONIX) 40 MG tablet Take 1 tablet (40 mg total) by mouth daily. Patient taking differently: Take 40 mg by mouth See admin instructions. Take 40 mg by mouth in the morning before breakfast and an additional 40 mg once a day, if symptoms persist 06/05/19   Maudie Mercury, MD  sevelamer carbonate (RENVELA) 800 MG tablet Take 4 tablets with meals  And 2 tablets twice daily with snacks Patient taking differently: Take  1,600-2,400 mg by mouth See admin instructions. Take 2,400 mg by mouth three times a day with meals and 1,600 mg with each snack 01/22/18   Patrecia Pour, MD  sodium zirconium cyclosilicate (LOKELMA) 5 g packet Take 5 g by mouth 2 (two) times daily. Patient not taking: Reported on 08/10/2019 08/01/19   Miquel Dunn, NP  sulfamethoxazole-trimethoprim (BACTRIM DS) 800-160 MG tablet Take 1 tablet by mouth daily. 12/30/18   Geradine Girt, DO  SYMBICORT 160-4.5 MCG/ACT inhaler Inhale 2 puffs into the lungs 2 (two) times daily.  11/30/18    [provider]  zolpidem (AMBIEN) 10 MG tablet Take 10 mg by mouth at bedtime as needed for sleep.    [provider]    Physical Exam: Vitals:   08/12/19 0545 08/12/19 0600 08/12/19 0645 08/12/19 0700  BP: (!) 170/99 (!) 145/64 (!) 180/74 (!) 175/74  Pulse: 63 60    Resp: 18 16 15 14   SpO2: 100% 100%    Weight:         . General:  Appears calm and comfortable and is NAD, somewhat somnolent . Eyes:  PERRL, EOMI, normal lids, iris . ENT:  grossly normal hearing, lips & tongue, mmm; poor dentition . Neck:  no LAD, masses or thyromegaly . Cardiovascular:  RRR, no m/r/g. No LE edema.  Marland Kitchen Respiratory:   CTA bilaterally with no wheezes/rales/rhonchi.  Normal respiratory effort. . Abdomen:  soft, NT, +ascites . Skin:  no rash or induration seen on limited exam . Musculoskeletal:  grossly normal tone BUE/BLE, good ROM, no bony abnormality . Psychiatric:  blunted mood and affect, speech fluent and appropriate, AOx3 . Neurologic:  CN 2-12 grossly intact, moves all extremities in coordinated fashion    Radiological Exams on Admission: No results found.  EKG: Independently reviewed.   31 - Wide complex tachycardia with rate 175 0538 - NSR with rate 71; LBBB   Labs on Admission: I have personally reviewed the available labs and imaging studies at the time of the admission.  Pertinent labs:   CO2 20 Glucose 119 BUN 23/Creatinine 6.12/GFR 11 WBC 7.1 Hgb 9.8   Assessment/Plan Active Problems:   Wide-complex tachycardia (HCC)   Decompensated hepatic cirrhosis (HCC)   ESRD (end stage renal disease) (HCC)   A-fib (HCC)   Afib with recurrent presentations of wide complex tachycardia -This is a recurrent issue for the patient and his cardiology group knows him well -He was electrically cardioverted in the ER today and is current in NSR -I discussed the patient with Dr. Virgina Jock -He is not a candidate for ablation, won't change his long-term outcome -He  likely does not need to stay in the hospital overnight -Dr. Virgina Jock will arrange outpatient f/u and possibly outpatient EP evaluation  ESRD on HD -h/o noncompliance -Currently without hyperkalemia -Due for HD tomorrow as an outpatient  Chronic cirrhosis due to Hep B/C with ascites -Last paracentesis was 1/28 -Does not appear to be uncomfortable and/or SOB at this time -He is scheduled again for 2/11   Thank you for this interesting consult.  The patient appears to be stable for d/c at this time and his cardiologist is in agreement with this plan.     Karmen Bongo MD [facility]   How to contact the [facility] Attending or Consulting provider [place] or covering provider during after hours [place], for this patient?  1. Check the care team in [facility] and look for a) attending/consulting [facility] provider listed  and b) the [facility] team listed 2. Log into [email or web address] and use [place]'s universal password to access. If you do not have the password, please contact the hospital operator. 3. Locate the [facility] provider you are looking for under [facility] and page to a number that you can be directly reached. 4. If you still have difficulty reaching the provider, please page the [facility] (Director on Call) for the Hospitalists listed on amion for assistance.   08/12/2019, 8:45 AM

## 2019-08-12 NOTE — ED Provider Notes (Addendum)
[facility]ER[facility] DEPARTMENT Provider Note   CSN: [id number] Arrival date & time: 08/12/19  3267   History Chief Complaint  Patient presents with  . Palpitations    [name] is a 59 y.o. male.  The history is provided by the patient.  Palpitations He has history of hypertension, end-stage renal disease on hemodialysis, atrial fibrillation, coronary artery disease and was brought in by ambulance because of palpitations.  He woke up with his heart racing.  He denies chest pain or dyspnea.  EMS noted wide-complex tachycardia which is a felt was probably ventricular tachycardia.  He was given amiodarone in route with no change.  His last dialysis session was 2 days ago, neck scheduled dialysis is tomorrow.  Past Medical History:  Diagnosis Date  . Anemia   . Anxiety   . Arthritis    left shoulder  . Atherosclerosis of aorta ([place])   . Cardiomegaly   . Chest pain    DATE UNKNOWN, C/O PERIODICALLY  . Cocaine abuse ([place])   . COPD exacerbation ([place]) 08/17/2016  . Coronary artery disease    stent 02/22/17  . ESRD (end stage renal disease) on dialysis ([place])    "[name]; MWF" (07/04/2017)  . GERD (gastroesophageal reflux disease)    DATE UNKNOWN  . Hemorrhoids   . Hepatitis B, chronic ([place])   . Hepatitis C   . History of kidney stones   . Hyperkalemia   . Hypertension   . Kidney failure   . Metabolic bone disease    Patient denies  . Mitral stenosis   . Myocardial infarction ([place])   . Pneumonia   . Pulmonary edema   . Solitary rectal ulcer syndrome 07/2017   at [facility] for rectal bleeding  . Tubular adenoma of colon     Patient Active Problem List   Diagnosis Date Noted  . Dyspnea 06/09/2019  . Acquired thrombophilia ([place]) 06/05/2019  . A-fib ([place]) 05/30/2019  . Atrial fibrillation with RVR ([place]) 05/29/2019  . Melena   . Pressure injury of skin 03/09/2019  . Abdominal distention   . Volume overload 12/28/2018  . Sepsis ([place])  09/12/2018  . Atherosclerosis of native coronary artery of native heart without angina pectoris 03/11/2018  . Benign neoplasm of cecum   . Benign neoplasm of ascending colon   . Benign neoplasm of descending colon   . Benign neoplasm of rectum   . Paroxysmal atrial fibrillation ([place]) 01/23/2018  . Hx of colonic polyps 01/20/2018  . ESRD (end stage renal disease) ([place]) 11/21/2017  . GERD (gastroesophageal reflux disease) 11/16/2017  . Liver cirrhosis ([place]) 11/15/2017  . DNR (do not resuscitate)   . Palliative care by specialist   . Hyponatremia 11/04/2017  . SBP (spontaneous bacterial peritonitis) ([place]) 10/30/2017  . Liver disease, chronic 10/30/2017  . SOB (shortness of breath)   . Abdominal pain 10/28/2017  . Upper airway cough syndrome with flattening on f/v loop 10/13/17 c/w vcd 10/17/2017  . Elevated diaphragm 10/13/2017  . Ileus ([place]) 09/29/2017  . QT prolongation 09/29/2017  . Malnutrition of moderate degree 09/29/2017  . Sinus congestion 09/03/2017  . Symptomatic anemia 09/02/2017  . Other cirrhosis of liver ([place]) 09/02/2017  . Left bundle branch block 09/02/2017  . Mitral stenosis 09/02/2017  . Hematochezia 07/15/2017  . Wide-complex tachycardia ([place])   . Endotracheally intubated   . ESRD on dialysis ([place]) 07/04/2017  . Acute respiratory failure with hypoxia ([place]) 06/18/2017  . CKD (chronic kidney  disease) stage V requiring chronic dialysis ([place]) 06/18/2017  . History of Cocaine abuse ([place]) 06/18/2017  . Hypertension 06/18/2017  . Infection of AV graft for dialysis ([place]) 06/18/2017  . Anxiety 06/18/2017  . Anemia due to chronic kidney disease 06/18/2017  . Atypical atrial flutter ([place]) 06/18/2017  . Personality disorder ([place]) 06/13/2017  . Cellulitis 06/12/2017  . Adjustment disorder with mixed anxiety and depressed mood 06/10/2017  . Suicidal ideation 06/10/2017  . Arm wound, left, sequela 06/10/2017  . Dyspnea on exertion 05/29/2017  . Tachycardia 05/29/2017  .  Hyperkalemia 05/22/2017  . Acute metabolic encephalopathy   . Anemia 04/23/2017  . Ascites 04/23/2017  . COPD (chronic obstructive pulmonary disease) ([place]) 04/23/2017  . Acute on chronic respiratory failure with hypoxia ([place]) 03/25/2017  . Arrhythmia 03/25/2017  . COPD GOLD 0 with flattening on inps f/v  09/27/2016  . Essential hypertension 09/27/2016  . Fluid overload 08/30/2016  . COPD exacerbation ([place]) 08/17/2016  . Hypertensive urgency 08/17/2016  . Respiratory failure ([place]) 08/17/2016  . Problem with dialysis access ([place]) 07/23/2016  . Chronic hepatitis B ([place]) 03/05/2014  . Chronic hepatitis C without hepatic coma ([place]) 03/05/2014  . Internal hemorrhoids with bleeding, swelling and itching 03/05/2014  . Thrombocytopenia ([place]) 03/05/2014  . Chest pain 02/27/2014  . Alcohol abuse 04/14/2009  . Cigarette smoker 04/14/2009  . GANGLION CYST 04/14/2009    Past Surgical History:  Procedure Laterality Date  . A/V FISTULAGRAM Left 05/26/2017   Procedure: A/V FISTULAGRAM;  Surgeon: Conrad Inchelium, MD;  Location: [facility];  Service: Cardiovascular;  Laterality: Left;  . A/V FISTULAGRAM Right 11/18/2017   Procedure: A/V FISTULAGRAM - Right Arm;  Surgeon: Elam Dutch, MD;  Location: [facility];  Service: Cardiovascular;  Laterality: Right;  . APPLICATION OF WOUND VAC Left 06/14/2017   Procedure: APPLICATION OF WOUND VAC;  Surgeon: Katha Cabal, MD;  Location: [facility];  Service: Vascular;  Laterality: Left;  . AV FISTULA PLACEMENT  2012   BELIEVED WAS PLACED IN JUNE  . AV FISTULA PLACEMENT Right 08/09/2017   Procedure: Creation Right arm ARTERIOVENOUS BRACHIOCEPOHALIC FISTULA;  Surgeon: Elam Dutch, MD;  [facility];  Service: Vascular;  Laterality: Right;  . AV FISTULA PLACEMENT Right 11/22/2017   Procedure: INSERTION OF ARTERIOVENOUS (AV) GORE-TEX GRAFT RIGHT UPPER ARM;  Surgeon: Elam Dutch, MD;  Location: [facility];  Service: Vascular;   Laterality: Right;  . BIOPSY  01/25/2018   Procedure: BIOPSY;  Surgeon: Jerene Bears, MD;  Location: [facility];  Service: Gastroenterology;;  . BIOPSY  04/10/2019   Procedure: BIOPSY;  Surgeon: Jerene Bears, MD;  Location: [facility];  Service: Gastroenterology;;  . COLONOSCOPY    . COLONOSCOPY WITH PROPOFOL N/A 01/25/2018   Procedure: COLONOSCOPY WITH PROPOFOL;  Surgeon: Jerene Bears, MD;  Location: [facility];  Service: Gastroenterology;  Laterality: N/A;  . CORONARY STENT INTERVENTION N/A 02/22/2017   Procedure: CORONARY STENT INTERVENTION;  Surgeon: Nigel Mormon, MD;  Location: [facility];  Service: Cardiovascular;  Laterality: N/A;  . ESOPHAGOGASTRODUODENOSCOPY (EGD) WITH PROPOFOL N/A 01/25/2018   Procedure: ESOPHAGOGASTRODUODENOSCOPY (EGD) WITH PROPOFOL;  Surgeon: Jerene Bears, MD;  Location: [facility];  Service: Gastroenterology;  Laterality: N/A;  . ESOPHAGOGASTRODUODENOSCOPY (EGD) WITH PROPOFOL N/A 04/10/2019   Procedure: ESOPHAGOGASTRODUODENOSCOPY (EGD) WITH PROPOFOL;  Surgeon: Jerene Bears, MD;  [facility]: [facility];  Service: Gastroenterology;  Laterality: N/A;  . FLEXIBLE SIGMOIDOSCOPY N/A 07/15/2017   Procedure: FLEXIBLE  SIGMOIDOSCOPY;  Surgeon: Carol Ada, MD;  Location: [facility];  Service: Endoscopy;  Laterality: N/A;  . HEMORRHOID BANDING    . I & D EXTREMITY Left 06/01/2017   Procedure: IRRIGATION AND DEBRIDEMENT LEFT ARM HEMATOMA WITH LIGATION OF LEFT ARM AV FISTULA;  Surgeon: Elam Dutch, MD;  Location: [facility];  Service: Vascular;  Laterality: Left;  . I & D EXTREMITY Left 06/14/2017   Procedure: IRRIGATION AND DEBRIDEMENT EXTREMITY;  Surgeon: Katha Cabal, MD;  Location: [facility];  Service: Vascular;  Laterality: Left;  . INSERTION OF DIALYSIS CATHETER  05/30/2017  . INSERTION OF DIALYSIS CATHETER N/A 05/30/2017   Procedure: INSERTION OF DIALYSIS CATHETER;  Surgeon: Elam Dutch, MD;  Location: [facility];  Service: Vascular;   Laterality: N/A;  . IR PARACENTESIS  08/30/2017  . IR PARACENTESIS  09/29/2017  . IR PARACENTESIS  10/28/2017  . IR PARACENTESIS  11/09/2017  . IR PARACENTESIS  11/16/2017  . IR PARACENTESIS  11/28/2017  . IR PARACENTESIS  12/01/2017  . IR PARACENTESIS  12/06/2017  . IR PARACENTESIS  01/03/2018  . IR PARACENTESIS  01/23/2018  . IR PARACENTESIS  02/07/2018  . IR PARACENTESIS  02/21/2018  . IR PARACENTESIS  03/06/2018  . IR PARACENTESIS  03/17/2018  . IR PARACENTESIS  04/04/2018  . IR PARACENTESIS  12/28/2018  . IR PARACENTESIS  01/08/2019  . IR PARACENTESIS  01/23/2019  . IR PARACENTESIS  02/01/2019  . IR PARACENTESIS  02/19/2019  . IR PARACENTESIS  03/01/2019  . IR PARACENTESIS  03/15/2019  . IR PARACENTESIS  04/03/2019  . IR PARACENTESIS  04/12/2019  . IR PARACENTESIS  05/01/2019  . IR PARACENTESIS  05/08/2019  . IR PARACENTESIS  05/24/2019  . IR PARACENTESIS  06/12/2019  . IR PARACENTESIS  07/09/2019  . IR PARACENTESIS  07/27/2019  . IR PARACENTESIS  08/09/2019  . IR RADIOLOGIST EVAL & MGMT  02/14/2018  . IR RADIOLOGIST EVAL & MGMT  02/22/2019  . LEFT HEART CATH AND CORONARY ANGIOGRAPHY N/A 02/22/2017   Procedure: LEFT HEART CATH AND CORONARY ANGIOGRAPHY;  Surgeon: Nigel Mormon, MD;  Location: [facility];  Service: Cardiovascular;  Laterality: N/A;  . LIGATION OF ARTERIOVENOUS  FISTULA Left 07/17/6061   Procedure: Plication of Left Arm Arteriovenous Fistula;  Surgeon: Elam Dutch, MD;  Location: [facility];  Service: Vascular;  Laterality: Left;  . POLYPECTOMY    . POLYPECTOMY  01/25/2018   Procedure: POLYPECTOMY;  Surgeon: Jerene Bears, MD;  Location: [facility];  Service: Gastroenterology;;  . REVISON OF ARTERIOVENOUS FISTULA Left 0/16/0109   Procedure: PLICATION OF DISTAL ANEURYSMAL SEGEMENT OF LEFT UPPER ARM ARTERIOVENOUS FISTULA;  Surgeon: Elam Dutch, MD;  Location: [facility];  Service: Vascular;  Laterality: Left;  . REVISON OF ARTERIOVENOUS FISTULA Left 10/02/5571   Procedure:  Plication of Left Upper Arm Fistula ;  Surgeon: Waynetta Sandy, MD;  Location: [facility];  Service: Vascular;  Laterality: Left;  . SKIN GRAFT SPLIT THICKNESS LEG / FOOT Left    SKIN GRAFT SPLIT THICKNESS LEFT ARM DONOR SITE: LEFT ANTERIOR THIGH  . SKIN SPLIT GRAFT Left 07/04/2017   Procedure: SKIN GRAFT SPLIT THICKNESS LEFT ARM DONOR SITE: LEFT ANTERIOR THIGH;  Surgeon: Elam Dutch, MD;  Location: [facility];  Service: Vascular;  Laterality: Left;  . THROMBECTOMY W/ EMBOLECTOMY Left 06/05/2017   Procedure: EXPLORATION OF LEFT ARM FOR BLEEDING; OVERSEWED PROXIMAL FISTULA;  Surgeon: Angelia Mould, MD;  Location: [facility];  Service:  Vascular;  Laterality: Left;  . WOUND EXPLORATION Left 06/03/2017   Procedure: WOUND EXPLORATION WITH WOUND VAC APPLICATION TO LEFT ARM;  Surgeon: Angelia Mould, MD;  Location: [facility] OR;  Service: Vascular;  Laterality: Left;       Family History  Problem Relation Age of Onset  . Heart disease Mother   . Lung cancer Mother   . Heart disease Father   . Malignant hyperthermia Father   . COPD Father   . Throat cancer Sister   . Esophageal cancer Sister   . Hypertension Other   . COPD Other   . Colon cancer Neg Hx   . Colon polyps Neg Hx   . Rectal cancer Neg Hx   . Stomach cancer Neg Hx     Social History   Tobacco Use  . Smoking status: Current Every Day Smoker    Packs/day: 0.50    Years: 43.00    Pack years: 21.50    Types: Cigarettes    Start date: 08/13/1973  . Smokeless tobacco: Never Used  . Tobacco comment: i dont know i just make   Substance Use Topics  . Alcohol use: Not Currently    Comment: quit drinking in 2017  . Drug use: Not Currently    Types: Marijuana, Cocaine    Comment: reports using once every 3 months, 04-06-2019 was this     Home Medications Prior to Admission medications   Medication Sig Start Date End Date Taking? Authorizing Provider  albuterol (VENTOLIN HFA) 108 (90 Base) MCG/ACT inhaler Inhale 2  puffs into the lungs every 6 (six) hours as needed for wheezing or shortness of breath.    [provider]  amiodarone (PACERONE) 200 MG tablet Take 1 tablet (200 mg total) by mouth daily. 07/22/19 08/21/19  Lucrezia Starch, MD  hydrALAZINE (APRESOLINE) 100 MG tablet Take 0.5 tablets (50 mg total) by mouth 3 (three) times daily. Patient taking differently: Take 100 mg by mouth 3 (three) times daily.  05/31/19   [place], Carley D, DO  lactulose (CHRONULAC) 10 GM/15ML solution TAKE 45 MLS BY MOUTH 2 TIMES DAILY. Patient not taking: Reported on 08/01/2019 06/04/19   Al Decant, MD  loperamide (IMODIUM A-D) 2 MG tablet Take 2 mg by mouth 4 (four) times daily as needed for diarrhea or loose stools.    [provider]  metoprolol tartrate (LOPRESSOR) 100 MG tablet Take 1 tablet (100 mg total) by mouth 2 (two) times daily. 06/04/19   Al Decant, MD  montelukast (SINGULAIR) 10 MG tablet Take 10 mg by mouth every evening.     [provider]  ondansetron (ZOFRAN) 8 MG tablet Take 8 mg by mouth every 4 (four) hours as needed for nausea.  06/14/19   [provider]  pantoprazole (PROTONIX) 40 MG tablet Take 1 tablet (40 mg total) by mouth daily. Patient taking differently: Take 40 mg by mouth See admin instructions. Take 40 mg by mouth in the morning before breakfast and an additional 40 mg once a day, if symptoms persist 06/05/19   Maudie Mercury, MD  sevelamer carbonate (RENVELA) 800 MG tablet Take 4 tablets with meals  And 2 tablets twice daily with snacks Patient taking differently: Take 1,600-2,400 mg by mouth See admin instructions. Take 2,400 mg by mouth three times a day with meals and 1,600 mg with each snack 01/22/18   Patrecia Pour, MD  sodium zirconium cyclosilicate (LOKELMA) 5 g packet Take 5 g by mouth 2 (two) times  daily. Patient not taking: Reported on 08/10/2019 08/01/19   Miquel Dunn, NP  sulfamethoxazole-trimethoprim (BACTRIM DS) 800-160 MG  tablet Take 1 tablet by mouth daily. 12/30/18   Geradine Girt, DO  SYMBICORT 160-4.5 MCG/ACT inhaler Inhale 2 puffs into the lungs 2 (two) times daily.  11/30/18   [provider]  zolpidem (AMBIEN) 10 MG tablet Take 10 mg by mouth at bedtime as needed for sleep.    [provider]    Allergies    Morphine and related, Aspirin, Clonidine derivatives, Tramadol, and Tylenol [acetaminophen]  Review of Systems   Review of Systems  Cardiovascular: Positive for palpitations.  All other systems reviewed and are negative.   Physical Exam Updated Vital Signs BP (!) 170/79   Pulse 73   Resp 16   Wt 68 kg   SpO2 100%   BMI 21.51 kg/m   Physical Exam Vitals and nursing note reviewed.   59 year old male, resting comfortably and in no acute distress. Vital signs are significant for rapid heart rate and relatively low blood pressure. Oxygen saturation is 100%, which is normal. Head is normocephalic and atraumatic. PERRLA, EOMI. Oropharynx is clear. Neck is nontender and supple without adenopathy or JVD. Back is nontender and there is no CVA tenderness. Lungs are clear without rales, wheezes, or rhonchi. Chest is nontender. Heart is tachycardic without murmur. Abdomen is soft, flat, nontender without masses or hepatosplenomegaly and peristalsis is normoactive. Extremities have no cyanosis or edema, full range of motion is present.  AV fistula present in the right antecubital area with bruit heard but no thrill noted. Skin is warm and dry without rash. Neurologic: Mental status is normal, cranial nerves are intact, there are no motor or sensory deficits.  ED Results / Procedures / Treatments   Labs (all labs ordered are listed, but only abnormal results are displayed) Labs Reviewed  BASIC METABOLIC PANEL  CBC WITH DIFFERENTIAL/PLATELET  MAGNESIUM    EKG EKG Interpretation  Date/Time:  Sunday August 12 2019 05:31:13 EST Ventricular Rate:  175 PR Interval:    QRS  Duration: 182 QT Interval:  308 QTC Calculation: 526 R Axis:   -93 Text Interpretation: Extreme tachycardia with wide complex, no further rhythm analysis attempted Probable ventricular tachycardia When compared with ECG of 08/10/2019, Wide QRS tachycardia has replaced Atrial flutter with 4:1 A-V conduction Confirmed by Delora Fuel ([id number]) on 08/12/2019 5:54:14 AM   EKG Interpretation  Date/Time:  Sunday August 12 2019 05:38:06 EST Ventricular Rate:  71 PR Interval:    QRS Duration: 171 QT Interval:  480 QTC Calculation: 522 R Axis:   -88 Text Interpretation: Sinus rhythm Atrial premature complexes Probable left atrial enlargement Left bundle branch block Baseline wander in lead(s) V3 When compared with ECG of EARLIER SAME DATE Sinus rhythm has replaced Wide QRS tachycardia Confirmed by Delora Fuel ([id number]) on 08/12/2019 5:55:47 AM       Radiology DG Chest Port 1 View  Result Date: 08/10/2019 CLINICAL DATA:  Shortness of breath. Additional history provided by technologist: Shortness of breath and chest pain. EXAM: PORTABLE CHEST 1 VIEW COMPARISON:  Chest radiograph 08/08/2019 FINDINGS: Unchanged cardiomegaly. Aortic atherosclerosis. Mild pulmonary interstitial prominence, unchanged. No evidence of pleural effusion or pneumothorax. No acute bony abnormality. Overlying cardiac monitoring leads. IMPRESSION: Mild pulmonary interstitial prominence is unchanged. This may reflect mild interstitial edema. Atypical/viral pneumonia not excluded. Unchanged cardiomegaly.  Aortic atherosclerosis. Electronically Signed   By: Kellie Simmering DO   On: 08/10/2019 07:18  Procedures .Sedation  Date/Time: 08/12/2019 5:43 AM Performed by: Delora Fuel, MD Authorized by: Delora Fuel, MD   Consent:    Consent obtained:  Verbal   Consent given by:  Patient   Risks discussed:  Allergic reaction, dysrhythmia, inadequate sedation, nausea, prolonged hypoxia resulting in organ damage, prolonged sedation  necessitating reversal, respiratory compromise necessitating ventilatory assistance and intubation and vomiting   Alternatives discussed:  Analgesia without sedation, anxiolysis and regional anesthesia Universal protocol:    Procedure explained and questions answered to patient or proxy's satisfaction: yes     Relevant documents present and verified: yes     Test results available and properly labeled: yes     Imaging studies available: yes     Required blood products, implants, devices, and special equipment available: yes     Site/side marked: yes     Immediately prior to procedure a time out was called: yes     Patient identity confirmation method:  Verbally with patient and arm band Indications:    Procedure performed:  Cardioversion   Procedure necessitating sedation performed by:  Physician performing sedation Pre-sedation assessment:    Time since last food or drink:  6 hours   ASA classification: class 3 - patient with severe systemic disease     Neck mobility: normal     Mouth opening:  3 or more finger widths   Thyromental distance:  4 finger widths   Mallampati score:  I - soft palate, uvula, fauces, pillars visible   Pre-sedation assessments completed and reviewed: airway patency, cardiovascular function, hydration status, mental status, nausea/vomiting, pain level, respiratory function and temperature     Pre-sedation assessment completed:  08/12/2019 5:35 AM Immediate pre-procedure details:    Reassessment: Patient reassessed immediately prior to procedure     Reviewed: vital signs, relevant labs/tests and NPO status     Verified: bag valve mask available, emergency equipment available, intubation equipment available, IV patency confirmed, oxygen available and suction available   Procedure details (see MAR for exact dosages):    Preoxygenation:  Nasal cannula   Sedation:  Propofol   Intended level of sedation: deep   Intra-procedure monitoring:  Blood pressure monitoring,  cardiac monitor, continuous pulse oximetry, frequent LOC assessments, frequent vital sign checks and continuous capnometry   Intra-procedure events: none     Total Provider sedation time (minutes):  30 Post-procedure details:    Post-sedation assessment completed:  08/12/2019 6:05 AM   Attendance: Constant attendance by certified staff until patient recovered     Recovery: Patient returned to pre-procedure baseline     Post-sedation assessments completed and reviewed: airway patency, cardiovascular function, hydration status, mental status, nausea/vomiting, pain level, respiratory function and temperature     Patient is stable for discharge or admission: yes     Patient tolerance:  Tolerated well, no immediate complications .Cardioversion  Date/Time: 08/12/2019 5:45 AM Performed by: Delora Fuel, MD Authorized by: Delora Fuel, MD   Consent:    Consent obtained:  Verbal   Consent given by:  Patient   Risks discussed:  Cutaneous burn, induced arrhythmia and death   Alternatives discussed:  No treatment Pre-procedure details:    Cardioversion basis:  Emergent   Rhythm:  Ventricular tachycardia   Electrode placement:  Anterior-posterior Patient sedated: Yes. Refer to sedation procedure documentation for details of sedation.  Attempt one:    Cardioversion mode:  Synchronous   Waveform:  Biphasic   Shock (Joules):  120   Shock outcome:  Conversion to normal  sinus rhythm Post-procedure details:    Patient status:  Awake   Patient tolerance of procedure:  Tolerated well, no immediate complications  CRITICAL CARE Performed by: Delora Fuel Total critical care time: 45 minutes Critical care time was exclusive of separately billable procedures and treating other patients. Critical care was necessary to treat or prevent imminent or life-threatening deterioration. Critical care was time spent personally by me on the following activities: development of treatment plan with patient and/or  surrogate as well as nursing, discussions with consultants, evaluation of patient's response to treatment, examination of patient, obtaining history from patient or surrogate, ordering and performing treatments and interventions, ordering and review of laboratory studies, ordering and review of radiographic studies, pulse oximetry and re-evaluation of patient's condition.  Medications Ordered in ED Medications  propofol (DIPRIVAN) 10 mg/mL bolus/IV push (35 mg Intravenous Given 08/12/19 0537)    ED Course  I have reviewed the triage vital signs and the nursing notes.  Pertinent labs & imaging results that were available during my care of the patient were reviewed by me and considered in my medical decision making (see chart for details).  MDM Rules/Calculators/A&P Patient presenting with wide-complex tachycardia and blood pressure that is relatively low for him.  Old records are reviewed and he has had episodes of both atrial fibrillation with rapid ventricular response and ventricular tachycardia.  With his relatively low blood pressure, it was felt that emergent cardioversion was the appropriate treatment.  He was sedated with propofol and cardioverted with resulting normal sinus rhythm.  Will check electrolytes.  Labs showed normal potassium level, end-stage renal disease, anemia which is secondary to renal failure and not significantly changed from baseline.  Magnesium level has come back low and is given a dose of intravenous magnesium.  Will plan to admit for further cardiac monitoring and cardiology consultation.  Case is discussed with Dr. Lorin Mercy of [facility], who agrees to admit the patient.  Final Clinical Impression(s) / ED Diagnoses Final diagnoses:  Wide-complex tachycardia ([place])  End-stage renal disease on hemodialysis ([place])  Hypomagnesemia  Anemia associated with chronic renal failure    Rx / DC Orders ED Discharge Orders    None       Delora Fuel, MD 55/97/41  [id number]    Delora Fuel, MD 53/64/68 [phone]

## 2019-08-12 NOTE — Discharge Instructions (Addendum)
Please take medications as prescribed and follow up with your doctors this week

## 2019-08-12 NOTE — ED Notes (Signed)
Pt BP 99/79, on VT with 165 HR, 35 mg Propofol given per Dr. Roxanne Mins order, provider at the bedside.

## 2019-08-13 LAB — PATHOLOGIST SMEAR REVIEW

## 2019-08-14 ENCOUNTER — Telehealth: Payer: Self-pay

## 2019-08-14 NOTE — Telephone Encounter (Signed)
Pt called pt to inform us that he went tot he ED 08/12/2019 due that he went into A-Fib. The Hospital told him to start taking Amiodarone 200mg . Pt mention he still does not feel better and he is still on A-Fib. Please advise thank you

## 2019-08-14 NOTE — Telephone Encounter (Signed)
Needs OV sooner

## 2019-08-15 ENCOUNTER — Telehealth: Payer: Self-pay

## 2019-08-16 ENCOUNTER — Ambulatory Visit (INDEPENDENT_AMBULATORY_CARE_PROVIDER_SITE_OTHER): Payer: Medicare Other | Admitting: Cardiology

## 2019-08-16 ENCOUNTER — Other Ambulatory Visit: Payer: Self-pay

## 2019-08-16 ENCOUNTER — Encounter: Payer: Self-pay | Admitting: Cardiology

## 2019-08-16 VITALS — BP 186/90 | HR 87 | Temp 97.9°F | Ht 69.0 in | Wt 146.9 lb

## 2019-08-16 DIAGNOSIS — I2510 Atherosclerotic heart disease of native coronary artery without angina pectoris: Secondary | ICD-10-CM

## 2019-08-16 DIAGNOSIS — I447 Left bundle-branch block, unspecified: Secondary | ICD-10-CM | POA: Diagnosis not present

## 2019-08-16 DIAGNOSIS — I480 Paroxysmal atrial fibrillation: Secondary | ICD-10-CM

## 2019-08-16 DIAGNOSIS — I2781 Cor pulmonale (chronic): Secondary | ICD-10-CM

## 2019-08-16 DIAGNOSIS — I10 Essential (primary) hypertension: Secondary | ICD-10-CM | POA: Diagnosis not present

## 2019-08-16 IMAGING — DX DG CHEST 1V PORT
1 series · 1 of 1 positions shown · non-contrast
Comparison: 01/23/2018

CLINICAL DATA: Respiratory distress.  Current smoker.

EXAM:
PORTABLE CHEST 1 VIEW

[chest ap]
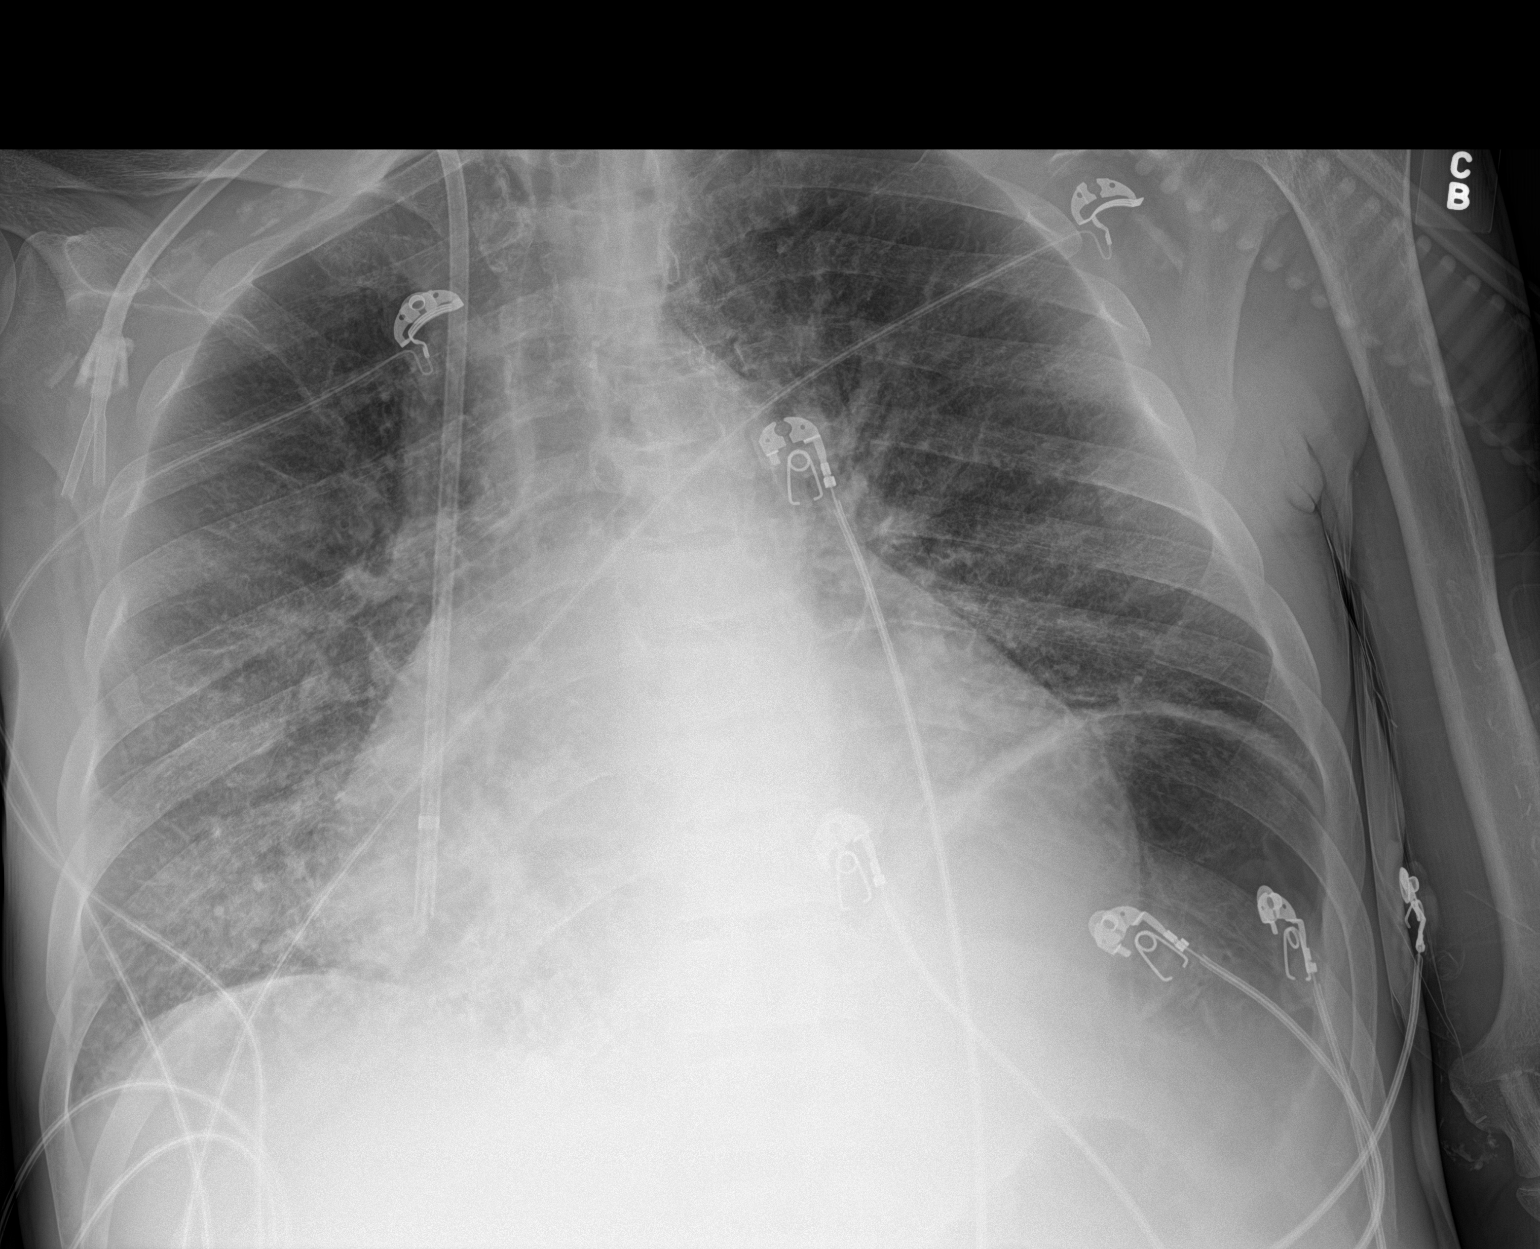

[1 of 1 positions shown; findings below may reference images not displayed]

FINDINGS: Cardiomegaly with interstitial edema, increased since prior
comparison. Right IJ approach dialysis catheter terminates in the
right atrium. Moderate aortic atherosclerosis without aneurysmal
dilatation noted. No significant effusion. No pulmonary
consolidation or pneumothorax. Slight elevation of the left
hemidiaphragm. No acute osseous abnormality.
IMPRESSION: Cardiomegaly with mild-to-moderate interstitial pulmonary edema,
increased since prior.

## 2019-08-16 MED ORDER — DILTIAZEM HCL ER COATED BEADS 240 MG PO CP24: 240.0000 mg | ORAL_CAPSULE | Freq: Every day | ORAL | 2 refills | Status: DC

## 2019-08-16 NOTE — Progress Notes (Signed)
Primary Physician:  [name], FNP   Patient ID: [name], male    DOB: Jul 30, 1960, 59 y.o.   MRN: [id number]  Subjective:    Chief Complaint  Patient presents with  . Atrial Fibrillation  . Hypertension  . Follow-up    4 week    HPI: [name]  is a 59 y.o. male  with very complex medical problemsincluding severe COPD and chronic cor pulmonale with pulmonary hypertension, coronary artery disease s/p stenting to mid RCA in 2018, paroxysmal atrial fibrillation with RVR, hypertensive cardiomyopathy, dietary and medication noncompliance, end-stage renal disease on hemodialysis, chronic hepatitis B and C, tobacco use, smokes 1/2 ppd, h/o marijuana use and prior cocaine use quit in 2017. Not on anticoagulation due to cirrhosis of liver and compliance with Warfarin.   He has had multiple ER visits for A fib with RVR, syncope, and hyperkalemia since he was last seen in April 2020. He has also missed several appointments with [place] but has restarted to come regularly.  He was started on amiodarone for A. fib with RVR, patient discontinued this 2 days ago on 08/13/2019 stating that he thinks that atrial fibrillation is worsened by this medication.  He states that he is feeling better since discontinuing this.  States that with the onset of atrial fibrillation he developed severe shortness of breath.  He denies any shortness of breath. Patient denies any complaints of chest pain, tightness or pressure. No orthopnea or PND. No history of swelling on the legs. No history of severe dizziness, near-syncope or syncope. No history of any active bleeding.  Past Medical History:  Diagnosis Date  . Anemia   . Anxiety   . Arthritis    left shoulder  . Atherosclerosis of aorta ([place])   . Cardiomegaly   . Chest pain    DATE UNKNOWN, C/O PERIODICALLY  . Cocaine abuse ([place])   . COPD exacerbation ([place]) 08/17/2016  . Coronary artery disease    stent 02/22/17  . ESRD (end  stage renal disease) on dialysis ([place])    "[name]; MWF" (07/04/2017)  . GERD (gastroesophageal reflux disease)    DATE UNKNOWN  . Hemorrhoids   . Hepatitis B, chronic ([place])   . Hepatitis C   . History of kidney stones   . Hyperkalemia   . Hypertension   . Kidney failure   . Metabolic bone disease    Patient denies  . Mitral stenosis   . Myocardial infarction ([place])   . Pneumonia   . Pulmonary edema   . Solitary rectal ulcer syndrome 07/2017   at [facility] for rectal bleeding  . Tubular adenoma of colon     Past Surgical History:  Procedure Laterality Date  . A/V FISTULAGRAM Left 05/26/2017   Procedure: A/V FISTULAGRAM;  Surgeon: Conrad Lake Preston, MD;  Location: [facility] [facility];  Service: Cardiovascular;  Laterality: Left;  . A/V FISTULAGRAM Right 11/18/2017   Procedure: A/V FISTULAGRAM - Right Arm;  Surgeon: Elam Dutch, MD;  Location: [place];  Service: Cardiovascular;  Laterality: Right;  . APPLICATION OF WOUND VAC Left 06/14/2017   Procedure: APPLICATION OF WOUND VAC;  Surgeon: Katha Cabal, MD;  Location: [facility];  Service: Vascular;  Laterality: Left;  . AV FISTULA PLACEMENT  2012   BELIEVED WAS PLACED IN JUNE  . AV FISTULA PLACEMENT Right 08/09/2017   Procedure: Creation Right arm ARTERIOVENOUS BRACHIOCEPOHALIC FISTULA;  Surgeon: Elam Dutch, MD;  [facility];  Service: Vascular;  Laterality: Right;  . AV FISTULA PLACEMENT Right 11/22/2017   Procedure: INSERTION OF ARTERIOVENOUS (AV) GORE-TEX GRAFT RIGHT UPPER ARM;  Surgeon: Elam Dutch, MD;  Location: [place];  Service: Vascular;  Laterality: Right;  . BIOPSY  01/25/2018   Procedure: BIOPSY;  Surgeon: Jerene Bears, MD;  Location: [place];  Service: Gastroenterology;;  . BIOPSY  04/10/2019   Procedure: BIOPSY;  Surgeon: Jerene Bears, MD;  Location: [facility];  Service: Gastroenterology;;  . COLONOSCOPY    . COLONOSCOPY WITH PROPOFOL N/A 01/25/2018   Procedure: COLONOSCOPY  WITH PROPOFOL;  Surgeon: Jerene Bears, MD;  Location: [place];  Service: Gastroenterology;  Laterality: N/A;  . CORONARY STENT INTERVENTION N/A 02/22/2017   Procedure: CORONARY STENT INTERVENTION;  Surgeon: Nigel Mormon, MD;  Location: [place] [facility];  Service: Cardiovascular;  Laterality: N/A;  . ESOPHAGOGASTRODUODENOSCOPY (EGD) WITH PROPOFOL N/A 01/25/2018   Procedure: ESOPHAGOGASTRODUODENOSCOPY (EGD) WITH PROPOFOL;  Surgeon: Jerene Bears, MD;  Location: [place];  Service: Gastroenterology;  Laterality: N/A;  . ESOPHAGOGASTRODUODENOSCOPY (EGD) WITH PROPOFOL N/A 04/10/2019   Procedure: ESOPHAGOGASTRODUODENOSCOPY (EGD) WITH PROPOFOL;  Surgeon: Jerene Bears, MD;  [facility];  Service: Gastroenterology;  Laterality: N/A;  . FLEXIBLE SIGMOIDOSCOPY N/A 07/15/2017   Procedure: FLEXIBLE SIGMOIDOSCOPY;  Surgeon: Carol Ada, MD;  Location: [place];  Service: Endoscopy;  Laterality: N/A;  . HEMORRHOID BANDING    . I & D EXTREMITY Left 06/01/2017   Procedure: IRRIGATION AND DEBRIDEMENT LEFT ARM HEMATOMA WITH LIGATION OF LEFT ARM AV FISTULA;  Surgeon: Elam Dutch, MD;  Location: [place];  Service: Vascular;  Laterality: Left;  . I & D EXTREMITY Left 06/14/2017   Procedure: IRRIGATION AND DEBRIDEMENT EXTREMITY;  Surgeon: Katha Cabal, MD;  Location: [facility];  Service: Vascular;  Laterality: Left;  . INSERTION OF DIALYSIS CATHETER  05/30/2017  . INSERTION OF DIALYSIS CATHETER N/A 05/30/2017   Procedure: INSERTION OF DIALYSIS CATHETER;  Surgeon: Elam Dutch, MD;  Location: [place];  Service: Vascular;  Laterality: N/A;  . IR PARACENTESIS  08/30/2017  . IR PARACENTESIS  09/29/2017  . IR PARACENTESIS  10/28/2017  . IR PARACENTESIS  11/09/2017  . IR PARACENTESIS  11/16/2017  . IR PARACENTESIS  11/28/2017  . IR PARACENTESIS  12/01/2017  . IR PARACENTESIS  12/06/2017  . IR PARACENTESIS  01/03/2018  . IR PARACENTESIS  01/23/2018  . IR PARACENTESIS  02/07/2018  . IR  PARACENTESIS  02/21/2018  . IR PARACENTESIS  03/06/2018  . IR PARACENTESIS  03/17/2018  . IR PARACENTESIS  04/04/2018  . IR PARACENTESIS  12/28/2018  . IR PARACENTESIS  01/08/2019  . IR PARACENTESIS  01/23/2019  . IR PARACENTESIS  02/01/2019  . IR PARACENTESIS  02/19/2019  . IR PARACENTESIS  03/01/2019  . IR PARACENTESIS  03/15/2019  . IR PARACENTESIS  04/03/2019  . IR PARACENTESIS  04/12/2019  . IR PARACENTESIS  05/01/2019  . IR PARACENTESIS  05/08/2019  . IR PARACENTESIS  05/24/2019  . IR PARACENTESIS  06/12/2019  . IR PARACENTESIS  07/09/2019  . IR PARACENTESIS  07/27/2019  . IR PARACENTESIS  08/09/2019  . IR RADIOLOGIST EVAL & MGMT  02/14/2018  . IR RADIOLOGIST EVAL & MGMT  02/22/2019  . LEFT HEART CATH AND CORONARY ANGIOGRAPHY N/A 02/22/2017   Procedure: LEFT HEART CATH AND CORONARY ANGIOGRAPHY;  Surgeon: Nigel Mormon, MD;  Location: [place] [facility];  Service: Cardiovascular;  Laterality: N/A;  .  LIGATION OF ARTERIOVENOUS  FISTULA Left 3/0/8657   Procedure: Plication of Left Arm Arteriovenous Fistula;  Surgeon: Elam Dutch, MD;  Location: [place];  Service: Vascular;  Laterality: Left;  . POLYPECTOMY    . POLYPECTOMY  01/25/2018   Procedure: POLYPECTOMY;  Surgeon: Jerene Bears, MD;  Location: [place];  Service: Gastroenterology;;  . REVISON OF ARTERIOVENOUS FISTULA Left 8/46/9629   Procedure: PLICATION OF DISTAL ANEURYSMAL SEGEMENT OF LEFT UPPER ARM ARTERIOVENOUS FISTULA;  Surgeon: Elam Dutch, MD;  Location: [place];  Service: Vascular;  Laterality: Left;  . REVISON OF ARTERIOVENOUS FISTULA Left 12/07/4130   Procedure: Plication of Left Upper Arm Fistula ;  Surgeon: Waynetta Sandy, MD;  Location: [place];  Service: Vascular;  Laterality: Left;  . SKIN GRAFT SPLIT THICKNESS LEG / FOOT Left    SKIN GRAFT SPLIT THICKNESS LEFT ARM DONOR SITE: LEFT ANTERIOR THIGH  . SKIN SPLIT GRAFT Left 07/04/2017   Procedure: SKIN GRAFT SPLIT THICKNESS LEFT ARM DONOR SITE: LEFT ANTERIOR  THIGH;  Surgeon: Elam Dutch, MD;  Location: [place];  Service: Vascular;  Laterality: Left;  . THROMBECTOMY W/ EMBOLECTOMY Left 06/05/2017   Procedure: EXPLORATION OF LEFT ARM FOR BLEEDING; OVERSEWED PROXIMAL FISTULA;  Surgeon: Angelia Mould, MD;  Location: [place];  Service: Vascular;  Laterality: Left;  . WOUND EXPLORATION Left 06/03/2017   Procedure: WOUND EXPLORATION WITH WOUND VAC APPLICATION TO LEFT ARM;  Surgeon: Angelia Mould, MD;  Location[facility];  Service: Vascular;  Laterality: Left;    Social History   Socioeconomic History  . Marital status: Single    Spouse name: Not on file  . Number of children: 3  . Years of education: 10  . Highest education level: Not on file  Occupational History  . Occupation: Unemployed  Tobacco Use  . Smoking status: Current Every Day Smoker    Packs/day: 0.50    Years: 43.00    Pack years: 21.50    Types: Cigarettes    Start date: 08/13/1973  . Smokeless tobacco: Never Used  . Tobacco comment: i dont know i just make   Substance and Sexual Activity  . Alcohol use: Not Currently    Comment: quit drinking in 2017  . Drug use: Not Currently    Types: Marijuana, Cocaine    Comment: reports using once every 3 months, 04-06-2019 was this   . Sexual activity: Not on file  Other Topics Concern  . Not on file  Social History Narrative   Lives alone   Caffeine use: Coffee-rare   Soda- daily      [facility] (03/10/17):   Originally from [facility]. Previously worked trimming trees. No pets currently. No bird or mold exposure.    Social Determinants of Health   Financial Resource Strain:   . Difficulty of Paying Living Expenses: Not on file  Food Insecurity:   . Worried About Charity fundraiser in the Last Year: Not on file  . Ran Out of Food in the Last Year: Not on file  Transportation Needs:   . Lack of Transportation (Medical): Not on file  . Lack of Transportation (Non-Medical): Not on file  Physical Activity:     . Days of Exercise per Week: Not on file  . Minutes of Exercise per Session: Not on file  Stress:   . Feeling of Stress : Not on file  Social Connections:   . Frequency of Communication with Friends and Family: Not on file  .  Frequency of Social Gatherings with Friends and Family: Not on file  . Attends Religious Services: Not on file  . Active Member of Clubs or Organizations: Not on file  . Attends Archivist Meetings: Not on file  . Marital Status: Not on file  Intimate Partner Violence:   . Fear of Current or Ex-Partner: Not on file  . Emotionally Abused: Not on file  . Physically Abused: Not on file  . Sexually Abused: Not on file   Review of Systems  Constitution: Negative for weight gain.  Cardiovascular: Positive for dyspnea on exertion (chronic) and palpitations. Negative for leg swelling and syncope.  Respiratory: Positive for wheezing. Negative for hemoptysis.   Endocrine: Negative for cold intolerance.  Hematologic/Lymphatic: Does not bruise/bleed easily.  Gastrointestinal: Negative for hematochezia and melena.   Objective:  Blood pressure (!) 186/90, pulse 87, temperature 97.9 F (36.6 C), height 5\' 9"  (1.753 m), weight 146 lb 14.4 oz (66.6 kg), SpO2 96 %. Body mass index is 21.69 kg/m.   Vitals with BMI 08/16/2019 08/16/2019 08/12/2019  Height - 5\' 9"  -  Weight - 146 lbs 14 oz -  BMI - 46.50 -  Systolic 354 656 -  Diastolic 90 87 -  Pulse 87 88 64  Some encounter information is confidential and restricted. Go to Review Flowsheets activity to see all data.    Physical Exam  Constitutional: Vital signs are normal.  Cardiovascular: Normal rate and regular rhythm.  Murmur heard.  Early systolic murmur is present with a grade of 2/6 at the upper right sternal border.  Low-pitched rumbling crescendo presystolic murmur is present with a grade of 3/6 at the apex. S1 normal and S2 loud.  Right arm AV graft noted  Pulmonary/Chest: Effort normal and breath  sounds normal. No accessory muscle usage. No respiratory distress.  Abdominal: Soft. Bowel sounds are normal.  Musculoskeletal:        General: Normal range of motion.  Vitals reviewed.  Laboratory examination:   CMP Latest Ref Rng & Units 08/12/2019 08/10/2019 08/08/2019  Glucose 70 - 99 mg/dL 119(H) 130(H) 94  BUN 6 - 20 mg/dL 23(H) 30(H) 28(H)  Creatinine 0.61 - 1.24 mg/dL 6.12(H) 7.76(H) 7.84(H)  Sodium 135 - 145 mmol/L 141 137 138  Potassium 3.5 - 5.1 mmol/L 4.1 5.4(H) 5.3(H)  Chloride 98 - 111 mmol/L 106 99 98  CO2 22 - 32 mmol/L 20(L) 24 28  Calcium 8.9 - 10.3 mg/dL 8.0(L) 9.4 9.7  Total Protein 6.5 - 8.1 g/dL - - 6.7  Total Bilirubin 0.3 - 1.2 mg/dL - - 0.6  Alkaline Phos 38 - 126 U/L - - 85  AST 15 - 41 U/L - - 23  ALT 0 - 44 U/L - - 12   CBC Latest Ref Rng & Units 08/12/2019 08/10/2019 08/08/2019  WBC 4.0 - 10.5 K/uL 7.1 6.5 7.8  Hemoglobin 13.0 - 17.0 g/dL 9.8(L) 10.6(L) 10.4(L)  Hematocrit 39.0 - 52.0 % 28.4(L) 31.1(L) 30.6(L)  Platelets 150 - 400 K/uL 161 156 170   Lipid Panel     Component Value Date/Time   CHOL 103 08/17/2016 0134   TRIG 97 08/17/2016 0134   HDL 34 (L) 08/17/2016 0134   CHOLHDL 3.0 08/17/2016 0134   VLDL 19 08/17/2016 0134   LDLCALC 50 08/17/2016 0134   HEMOGLOBIN A1C Lab Results  Component Value Date   HGBA1C 4.6 (L) 04/24/2017   MPG 85.32 04/24/2017   TSH No results for input(s): TSH in the last 8760  hours.  PRN Meds:. Medications Discontinued During This Encounter  Medication Reason  . amiodarone (PACERONE) 200 MG tablet Patient Preference  . sodium zirconium cyclosilicate (LOKELMA) 5 g packet Error   Current Meds  Medication Sig  . albuterol (VENTOLIN HFA) 108 (90 Base) MCG/ACT inhaler Inhale 2 puffs into the lungs every 6 (six) hours as needed for wheezing or shortness of breath.  . hydrALAZINE (APRESOLINE) 100 MG tablet Take 0.5 tablets (50 mg total) by mouth 3 (three) times daily. (Patient taking differently: Take 100 mg by  mouth 3 (three) times daily. )  . lactulose (CHRONULAC) 10 GM/15ML solution TAKE 45 MLS BY MOUTH 2 TIMES DAILY.  Marland Kitchen loperamide (IMODIUM A-D) 2 MG tablet Take 2 mg by mouth 4 (four) times daily as needed for diarrhea or loose stools.  . metoprolol tartrate (LOPRESSOR) 100 MG tablet Take 1 tablet (100 mg total) by mouth 2 (two) times daily.  . montelukast (SINGULAIR) 10 MG tablet Take 10 mg by mouth every evening.   . ondansetron (ZOFRAN) 8 MG tablet Take 8 mg by mouth every 4 (four) hours as needed for nausea.   . pantoprazole (PROTONIX) 40 MG tablet Take 1 tablet (40 mg total) by mouth daily. (Patient taking differently: Take 40 mg by mouth See admin instructions. Take 40 mg by mouth in the morning before breakfast and an additional 40 mg once a day, if symptoms persist)  . sevelamer carbonate (RENVELA) 800 MG tablet Take 4 tablets with meals  And 2 tablets twice daily with snacks (Patient taking differently: Take 1,600-2,400 mg by mouth See admin instructions. Take 2,400 mg by mouth three times a day with meals and 1,600 mg with each snack)  . sulfamethoxazole-trimethoprim (BACTRIM DS) 800-160 MG tablet Take 1 tablet by mouth daily.  . SYMBICORT 160-4.5 MCG/ACT inhaler Inhale 2 puffs into the lungs 2 (two) times daily.   Marland Kitchen zolpidem (AMBIEN) 10 MG tablet Take 10 mg by mouth at bedtime as needed for sleep.   Cardiac Studies:   Coronary Angiogram [02/22/2017]: No significant disease on left, mid RCA high-grade stenosis status post 3.5 x 23 mm Xience Alpine DES.  Carotid Doppler [10/28/2016]: Stenosis in the right common carotid artery (<50%) with severe heteregenous plaque. Stenosis in the left internal carotid artery (16-49%). Severe heteregenous plaque in the left common carotid artery with < 50% stenosis. Mild stenosis in the left external carotid artery (<50%). Antegrade vertebral artery flow. Follow up in one year is appropriate if clinically indicated.  Echocardiogram 08/30/2017: Left  ventricle is normal in size, severe LVH.  EF 55 to 60% with anteroseptal and inferoseptal hypokinesis. Ventricular septum shows diastolic flattening and systolic flattening consistent with right ventricular pressure and volume overload. Right ventricle is severely dilated.  Systolic function was normal. There is severe biatrial enlargement. Calcified mitral valve annulus.  Moderately thickened mitral valve leaflets with very mild restriction in mobility, suggest mild mitral stenosis. Mean gradient suggest severe mitral stenosis however calculated valve area indicates mild mitral valve stenosis. Mild mitral vegetation. No significant change from  03/14/2018.  Assessment:     ICD-10-CM   1. Paroxysmal atrial fibrillation ([place]). CHA2DS2-VASc Score is 2.  Yearly risk of stroke: 2.3% (HTN, CHF)   I48.0 EKG 12-Lead    Ambulatory referral to Cardiac Electrophysiology    diltiazem (CARDIZEM CD) 240 MG 24 hr capsule  2. Left bundle branch block  I44.7 Ambulatory referral to Cardiac Electrophysiology  3. Cor pulmonale, chronic (HCC)  I27.81   4. Atherosclerosis of  native coronary artery of native heart without angina pectoris  I25.10   5. Essential hypertension  I10 diltiazem (CARDIZEM CD) 240 MG 24 hr capsule    EKG 08/16/2019: Sinus rhythm with first-degree AV block at rate of 87 bpm, left atrial enlargement, left bundle branch block.  No further analysis.    EKG 08/01/19: Atrial fibrillation at 79 bpm, LBBB. No further analysis due to LBBB.  Recommendations:   [name]  is a 59 y.o. male  with very complex medical problemsincluding severe COPD and chronic cor pulmonale with pulmonary hypertension, coronary artery disease s/p stenting to mid RCA in 2018, paroxysmal atrial fibrillation with RVR, hypertensive cardiomyopathy, dietary and medication noncompliance, end-stage renal disease on hemodialysis, chronic hepatitis B and C, tobacco use, smokes 1/2 ppd, h/o marijuana use and prior  cocaine use quit in 2017. Not on anticoagulation due to cirrhosis of liver and compliance with Warfarin.   Extensive review of his medications and also his records from the hospital, in spite of medical complexities, he is presently doing well and there is no clinical evidence of heart failure.  Question is if we were to treat his atrial fibrillation he may do well without precipitation of acute decompensated heart failure.  I would refer him to be evaluated by EP, we could consider at least short course of warfarin if he is willing to be compliant prior to A. fib ablation.  We could consider use of either Eliquis or Xarelto as well although he has end-stage renal disease to improve compliance as alternative in view of FDA approval.  For now I have added diltiazem CD to 40 mg daily both for hypertension control and for A. fib with RVR prevention.  Today he is in sinus rhythm.  I would like to see him back in 4 weeks for follow-up.  Echocardiogram does reveal preserved LVEF and severe hypertension heart disease, cor pulmonale with RV dilatation and RVH but preserved RV function.  Smoking cessation again discussed.  Adrian Prows, MD, [facility] 08/16/2019, 3:00 PM [facility]. [place] Office: [phone]

## 2019-08-19 IMAGING — DX DG CHEST 2V
2 series · 3 of 3 positions shown · non-contrast
Comparison: February 14, 2018

CLINICAL DATA: Shortness of breath tonight

EXAM:
CHEST - 2 VIEW

[chest lat]
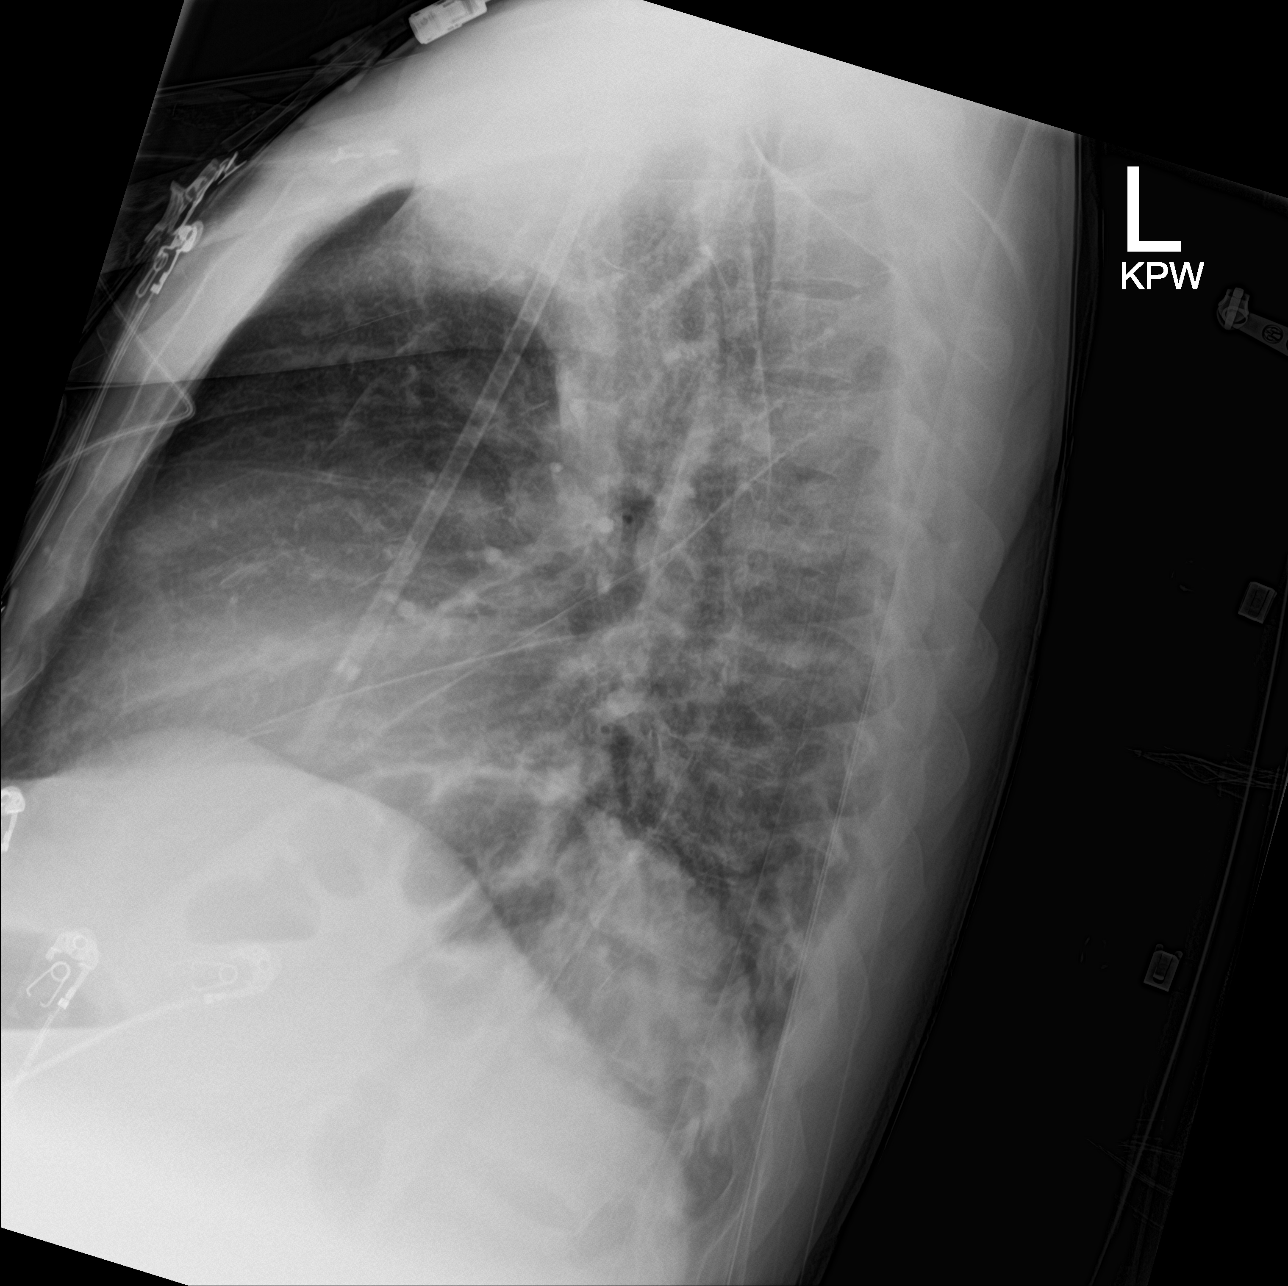

[Series 3: chest ap · 0.14mm/px · 2 of 2 slices shown]
[im 1/2]
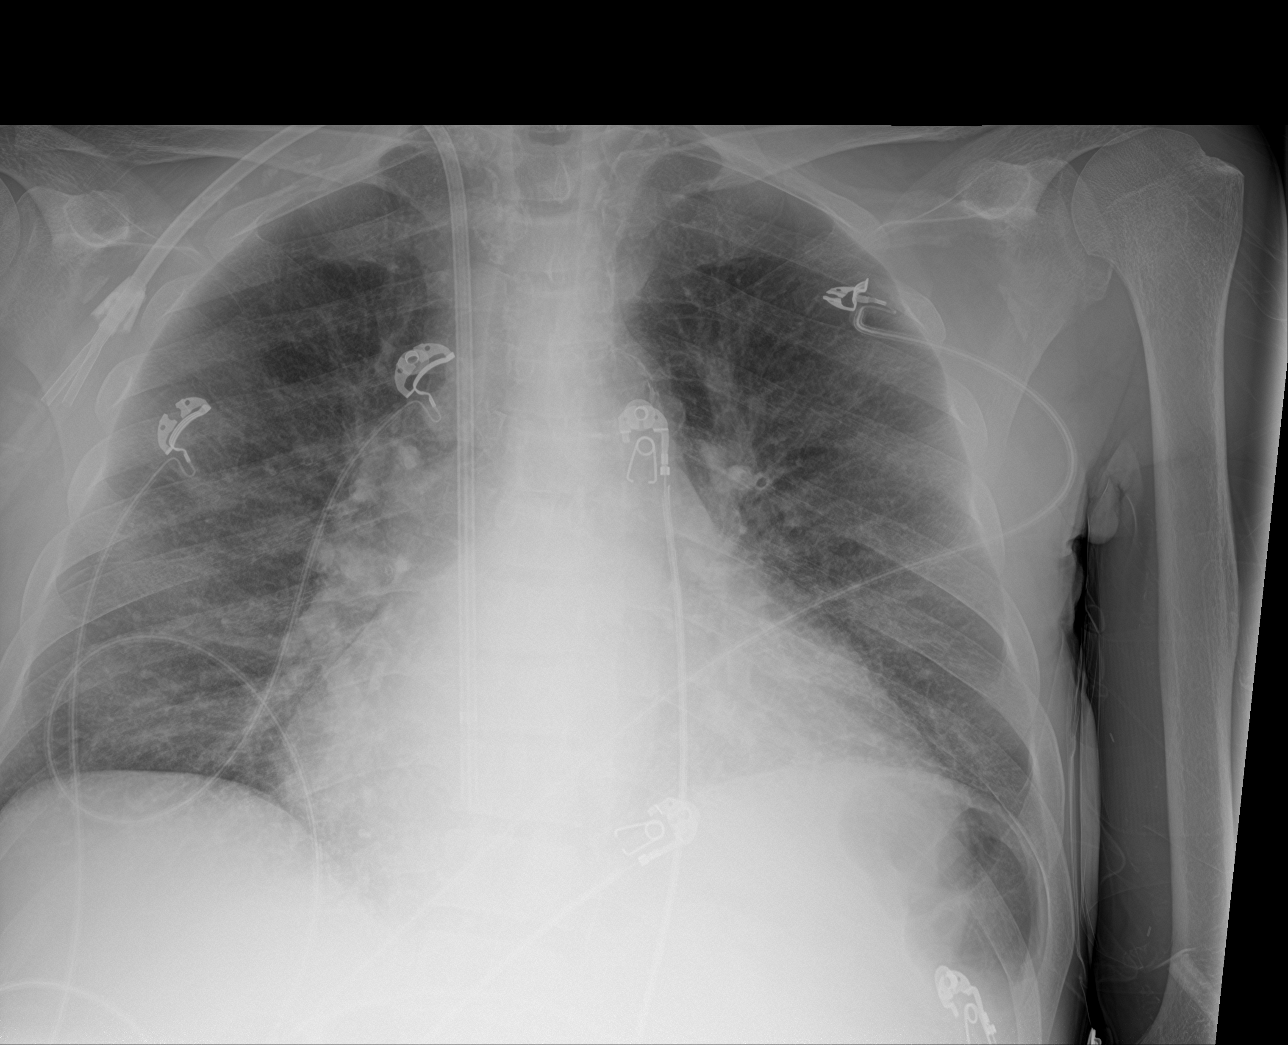
[im 2/2]
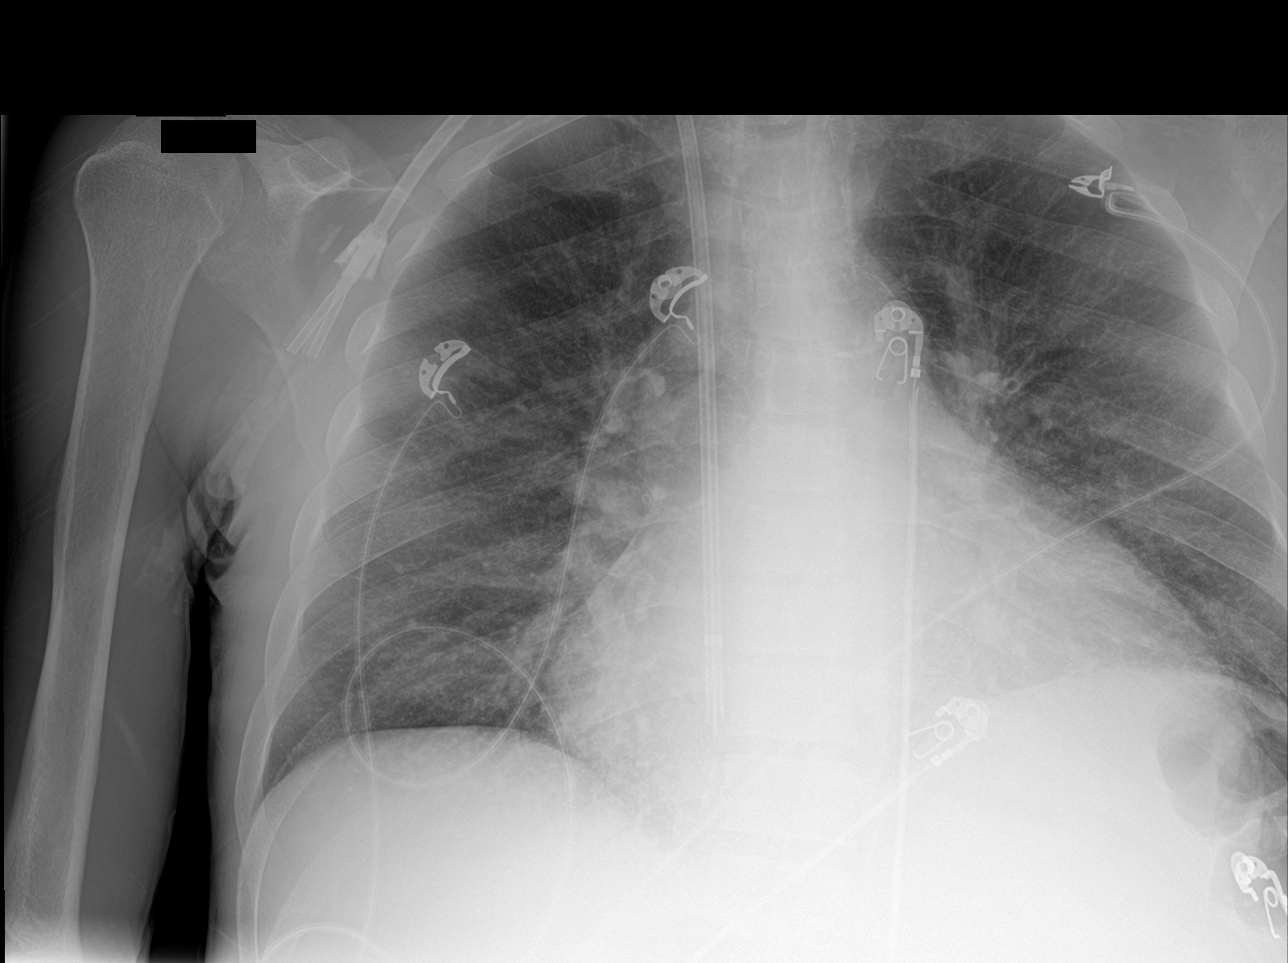

[3 of 3 positions shown; findings below may reference images not displayed]

FINDINGS: The heart size and mediastinal contours are stable. The heart size
is enlarged. Dual lumen central venous line is identified distal tip
in the right atrium. There is interstitial pulmonary edema. There is
no focal pneumonia or pleural effusion. The visualized skeletal
structures are stable.
IMPRESSION: Congestive heart failure.

## 2019-08-20 ENCOUNTER — Other Ambulatory Visit: Payer: Self-pay | Admitting: Internal Medicine

## 2019-08-20 ENCOUNTER — Telehealth: Payer: Self-pay | Admitting: Gastroenterology

## 2019-08-20 NOTE — Telephone Encounter (Signed)
The pt has been advised that he can be seen tomorrow 2/9 at 9 am.  He will take that app.

## 2019-08-21 ENCOUNTER — Other Ambulatory Visit: Payer: Self-pay

## 2019-08-21 ENCOUNTER — Ambulatory Visit (HOSPITAL_COMMUNITY)
Admission: RE | Admit: 2019-08-21 | Discharge: 2019-08-21 | Disposition: A | Discharge status: Home / Self Care | Payer: Medicare Other | Source: Ambulatory Visit | Attending: Family Medicine | Admitting: Family Medicine

## 2019-08-21 DIAGNOSIS — R188 Other ascites: Secondary | ICD-10-CM | POA: Insufficient documentation

## 2019-08-21 DIAGNOSIS — K7460 Unspecified cirrhosis of liver: Secondary | ICD-10-CM

## 2019-08-21 HISTORY — PX: IR PARACENTESIS: IMG2679

## 2019-08-21 LAB — BODY FLUID CELL COUNT WITH DIFFERENTIAL
Eos, Fluid: 0 %
Lymphs, Fluid: 87 %
Monocyte-Macrophage-Serous Fluid: 12 % — ABNORMAL LOW (ref 50–90)
Neutrophil Count, Fluid: 1 % (ref 0–25)
Total Nucleated Cell Count, Fluid: 64 cu mm (ref 0–1000)

## 2019-08-21 MED ORDER — LIDOCAINE HCL 1 % IJ SOLN
INTRAMUSCULAR | Status: AC
  Filled 2019-08-21: qty 20

## 2019-08-21 MED ORDER — LIDOCAINE HCL 1 % IJ SOLN
INTRAMUSCULAR | Status: DC | PRN
  Administered 2019-08-21: 10 mL

## 2019-08-21 NOTE — Procedures (Signed)
PROCEDURE SUMMARY:  Successful US guided paracentesis from LLQ.  Yielded 2.1 L of serosanguinous fluid.  No immediate complications.  Pt tolerated well.   Specimen was sent for labs.  EBL < 31mL  Ascencion Dike PA-C 08/21/2019 10:13 AM

## 2019-08-23 ENCOUNTER — Other Ambulatory Visit (HOSPITAL_COMMUNITY): Payer: Medicare Other

## 2019-08-23 LAB — PATHOLOGIST SMEAR REVIEW

## 2019-08-23 IMAGING — US IR PARACENTESIS
1 series · 2 of 2 positions shown · non-contrast
Comparison: none

INDICATION: Patient with history of cirrhosis, ascites. Request is made for
diagnostic and therapeutic paracentesis.

[Series 1: ir (id) (id)/(id)/(id) ir · 2 of 2 slices shown]
[im 1/2]
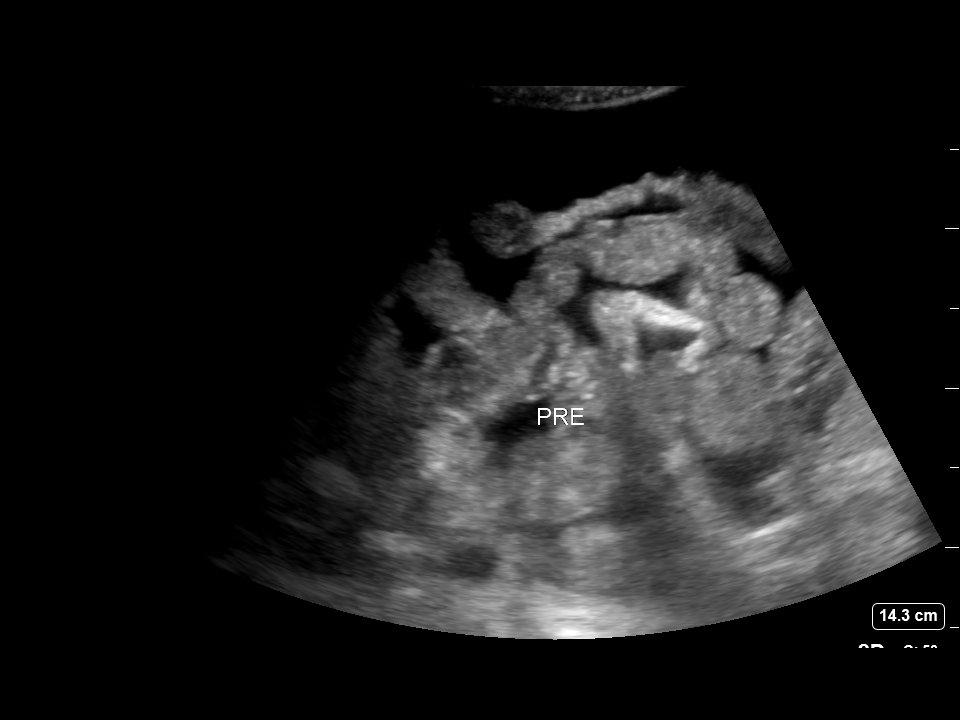
[im 2/2]
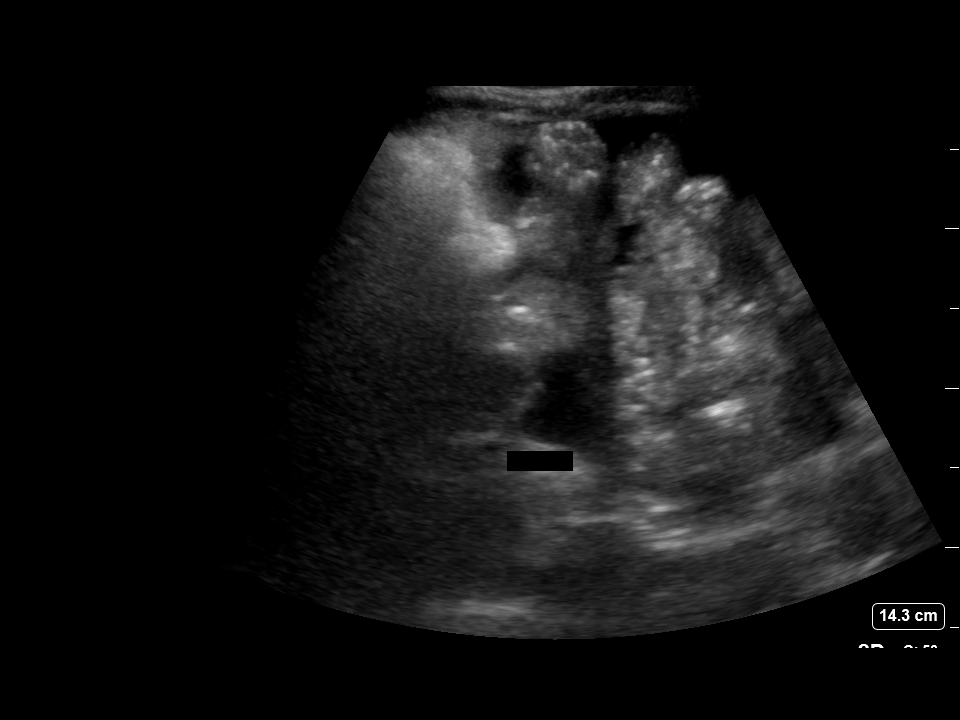

[2 of 2 positions shown; findings below may reference images not displayed]

EXAM:
ULTRASOUND GUIDED DIAGNOSTIC AND THERAPEUTIC PARACENTESIS

MEDICATIONS:
10 mL 1% lidocaine

COMPLICATIONS:
None immediate.

PROCEDURE:
Informed written consent was obtained from the patient after a
discussion of the risks, benefits and alternatives to treatment. A
timeout was performed prior to the initiation of the procedure.

Initial ultrasound scanning demonstrates a small amount of ascites
within the right lower abdominal quadrant. The right lower abdomen
was prepped and draped in the usual sterile fashion. 2% lidocaine
was used for local anesthesia.

Following this, a 19 gauge, 7-cm, Yueh catheter was introduced. An
ultrasound image was saved for documentation purposes. The
paracentesis was performed. The catheter was removed and a dressing
was applied. The patient tolerated the procedure well without
immediate post procedural complication.
FINDINGS: A total of approximately 1.2 liters of yellow fluid was removed.
Samples were sent to the laboratory as requested by the clinical
team.
IMPRESSION: Successful ultrasound-guided diagnostic and therapeutic paracentesis
yielding 1.2 liters of peritoneal fluid.

## 2019-08-27 ENCOUNTER — Other Ambulatory Visit: Payer: Medicare Other | Admitting: Hospice

## 2019-08-27 ENCOUNTER — Other Ambulatory Visit: Payer: Self-pay

## 2019-08-27 DIAGNOSIS — Z515 Encounter for palliative care: Secondary | ICD-10-CM

## 2019-08-27 DIAGNOSIS — N186 End stage renal disease: Secondary | ICD-10-CM

## 2019-08-27 NOTE — Progress Notes (Signed)
[place] Consult Note Telephone: [phone]  Fax: [phone]  PATIENT NAME: [name] DOB: 22-Aug-1960 MRN: [id number]  PRIMARY CARE PROVIDER:   [name], FNP  REFERRING PROVIDER:  [name], [name],  [name] [id number]  RESPONSIBLE PARTY:  Self [phone]   TELEHEALTH VISIT STATEMENT Due to the COVID-19 crisis, this visit was done via telephone from my office. It was initiated and consented to by this patient and/or family.  RECOMMENDATIONS/PLAN:   Advance Care Planning/Goals of Care: Telehealth Visit consisted of building trust and discussions onatient's health status. He said he has been having lots of issues with his blood pressure and Afib, saw Cardiologist last week and now feels better with new treatment. Therapeutic listening provided. He remains a DNR;  Goals of care include to maximize quality of life and symptom management.  Symptom management: 2. No palpitations or chest pain related to Afib, HTN. Patient saw his Cardiologist last week for Paroxysmal Afib HTN and dyspnea; Per chart, Cardiologist added diltiazem CD 40 mg daily both for hypertension control and for A. fib with RVR prevention, to go back in 09/13/2019. Patient is for Dialysis today for ESRD and also continues with paracentesis as scheduled. Education reinforced on adhering to dialysis schedules and appointments with his specialist, considering his multiple health issues. He verbalized understanding and said he will continue to try and comply. He mentioned he will see his Hepatologist next month for follow up. He said he tries to get help with home home services because sometimes he gets so weak he needs help at home - PT/OT Nursing aid. Called PCP's office and spoke with [name], relating message to her with call back number.  Follow up: Palliative care will continue to follow patient for goals of care clarification and  symptom management I spent 50 minutes providing this consultation; time includes chart review, documentation.  More than 50% of the time in this consultation was spent on coordinating communication   HISTORY OF PRESENT ILLNESS:[name] a 59 y.o.year oldmalewith multiple medical problems including ESRD on HD, cirrhosis of the liver, Hep C, CAD, pulmonary HTN, CHF,COPD.Palliative Care was asked to help address goals of care.  CODE STATUS: DNR  PPS: weak 60% HOSPICE ELIGIBILITY/DIAGNOSIS: TBD  PAST MEDICAL HISTORY:  Past Medical History:  Diagnosis Date  . Anemia   . Anxiety   . Arthritis    left shoulder  . Atherosclerosis of aorta ([name])   . Cardiomegaly   . Chest pain    DATE UNKNOWN, C/O PERIODICALLY  . Cocaine abuse ([name])   . COPD exacerbation ([name]) 08/17/2016  . Coronary artery disease    stent 02/22/17  . ESRD (end stage renal disease) on dialysis ([name])    "[name]; MWF" (07/04/2017)  . GERD (gastroesophageal reflux disease)    DATE UNKNOWN  . Hemorrhoids   . Hepatitis B, chronic ([name])   . Hepatitis C   . History of kidney stones   . Hyperkalemia   . Hypertension   . Kidney failure   . Metabolic bone disease    Patient denies  . Mitral stenosis   . Myocardial infarction ([name])   . Pneumonia   . Pulmonary edema   . Solitary rectal ulcer syndrome 07/2017   at flex sig for rectal bleeding  . Tubular adenoma of colon     SOCIAL HX:  Social History   Tobacco Use  . Smoking status: Current  Every Day Smoker    Packs/day: 0.50    Years: 43.00    Pack years: 21.50    Types: Cigarettes    Start date: 08/13/1973  . Smokeless tobacco: Never Used  . Tobacco comment: i dont know i just make   Substance Use Topics  . Alcohol use: Not Currently    Comment: quit drinking in 2017    ALLERGIES:  Allergies  Allergen Reactions  . Morphine And Related Other (See Comments)    Stomach pain  . Aspirin Other (See Comments)    STOMACH PAIN  .  Clonidine Derivatives Itching  . Tramadol Itching  . Tylenol [Acetaminophen] Nausea Only    Stomach ache     PERTINENT MEDICATIONS:  Outpatient Encounter Medications as of 08/27/2019  Medication Sig  . albuterol (VENTOLIN HFA) 108 (90 Base) MCG/ACT inhaler Inhale 2 puffs into the lungs every 6 (six) hours as needed for wheezing or shortness of breath.  . diltiazem (CARDIZEM CD) 240 MG 24 hr capsule Take 1 capsule (240 mg total) by mouth daily.  . hydrALAZINE (APRESOLINE) 100 MG tablet Take 0.5 tablets (50 mg total) by mouth 3 (three) times daily. (Patient taking differently: Take 100 mg by mouth 3 (three) times daily. )  . lactulose (CHRONULAC) 10 GM/15ML solution TAKE 45 MLS BY MOUTH 2 TIMES DAILY.  Marland Kitchen loperamide (IMODIUM A-D) 2 MG tablet Take 2 mg by mouth 4 (four) times daily as needed for diarrhea or loose stools.  . metoprolol tartrate (LOPRESSOR) 100 MG tablet Take 1 tablet (100 mg total) by mouth 2 (two) times daily.  . montelukast (SINGULAIR) 10 MG tablet Take 10 mg by mouth every evening.   . ondansetron (ZOFRAN) 8 MG tablet Take 8 mg by mouth every 4 (four) hours as needed for nausea.   . pantoprazole (PROTONIX) 40 MG tablet Take 1 tablet (40 mg total) by mouth daily. (Patient taking differently: Take 40 mg by mouth See admin instructions. Take 40 mg by mouth in the morning before breakfast and an additional 40 mg once a day, if symptoms persist)  . sevelamer carbonate (RENVELA) 800 MG tablet Take 4 tablets with meals  And 2 tablets twice daily with snacks (Patient taking differently: Take 1,600-2,400 mg by mouth See admin instructions. Take 2,400 mg by mouth three times a day with meals and 1,600 mg with each snack)  . sulfamethoxazole-trimethoprim (BACTRIM DS) 800-160 MG tablet Take 1 tablet by mouth daily.  . SYMBICORT 160-4.5 MCG/ACT inhaler Inhale 2 puffs into the lungs 2 (two) times daily.   Marland Kitchen zolpidem (AMBIEN) 10 MG tablet Take 10 mg by mouth at bedtime as needed for sleep.   No  facility-administered encounter medications on file as of 08/27/2019.   Joshua Spray, NP

## 2019-08-28 ENCOUNTER — Encounter: Payer: Self-pay | Admitting: Cardiology

## 2019-08-28 ENCOUNTER — Telehealth: Payer: Self-pay | Admitting: *Deleted

## 2019-08-28 ENCOUNTER — Other Ambulatory Visit: Payer: Self-pay

## 2019-08-28 ENCOUNTER — Ambulatory Visit (INDEPENDENT_AMBULATORY_CARE_PROVIDER_SITE_OTHER): Payer: Medicare Other | Admitting: Cardiology

## 2019-08-28 VITALS — BP 132/88 | HR 84 | Ht 69.0 in | Wt 142.4 lb

## 2019-08-28 DIAGNOSIS — I480 Paroxysmal atrial fibrillation: Secondary | ICD-10-CM

## 2019-08-28 NOTE — Progress Notes (Signed)
Electrophysiology Office Note   Date:  08/28/2019   ID:  [name], DOB 1961/04/13, MRN [id number]  PCP:  [name], FNP  Cardiologist:  Einar Gip Primary Electrophysiologist:  Milessa Hogan Meredith Leeds, MD    Chief Complaint: AF   History of Present Illness: [name] is a 59 y.o. male who is being seen today for the evaluation of AF at the request of Adrian Prows, MD. Presenting today for electrophysiology evaluation.  Has a history significant for severe COPD with chronic cor pulmonale and pulmonary hypertension, coronary artery disease status post RCA stent in 2018, paroxysmal atrial fibrillation, hypertensive cardiomyopathy, end-stage renal disease on dialysis, hepatitis B and C, tobacco abuse, history of marijuana and cocaine abuse.  He is not anticoagulated due to cirrhosis and complications with warfarin.  He has had multiple emergency room visits for A. fib with rapid rates, syncope, and hyperkalemia since April 2020.  He was started on amiodarone but discontinued this medication as he felt that this worsened his atrial fibrillation.  Over the course of the last few months, he has had multiple emergency room visits for atrial fibrillation.  He is required cardioversion at times.  He says that he gets quite short of breath when he is in atrial fibrillation.  He states that he does not usually get short of breath due to his COPD.  He has never had bleeding due to cirrhosis.  He has had his hepatitis treated in the past.  Today, he denies symptoms of palpitations, chest pain, shortness of breath, orthopnea, PND, lower extremity edema, claudication, dizziness, presyncope, syncope, bleeding, or neurologic sequela. The patient is tolerating medications without difficulties.    Past Medical History:  Diagnosis Date  . Anemia   . Anxiety   . Arthritis    left shoulder  . Atherosclerosis of aorta ([place])   . Cardiomegaly   . Chest pain    DATE UNKNOWN, C/O  PERIODICALLY  . Cocaine abuse ([place])   . COPD exacerbation ([place]) 08/17/2016  . Coronary artery disease    stent 02/22/17  . ESRD (end stage renal disease) on dialysis ([place])    "[name]; MWF" (07/04/2017)  . GERD (gastroesophageal reflux disease)    DATE UNKNOWN  . Hemorrhoids   . Hepatitis B, chronic ([place])   . Hepatitis C   . History of kidney stones   . Hyperkalemia   . Hypertension   . Kidney failure   . Metabolic bone disease    Patient denies  . Mitral stenosis   . Myocardial infarction ([place])   . Pneumonia   . Pulmonary edema   . Solitary rectal ulcer syndrome 07/2017   at [facility] for rectal bleeding  . Tubular adenoma of colon    Past Surgical History:  Procedure Laterality Date  . A/V FISTULAGRAM Left 05/26/2017   Procedure: A/V FISTULAGRAM;  Surgeon: Conrad Wasco, MD;  Location: [place] [facility];  Service: Cardiovascular;  Laterality: Left;  . A/V FISTULAGRAM Right 11/18/2017   Procedure: A/V FISTULAGRAM - Right Arm;  Surgeon: Elam Dutch, MD;  Location: [place] [facility];  Service: Cardiovascular;  Laterality: Right;  . APPLICATION OF WOUND VAC Left 06/14/2017   Procedure: APPLICATION OF WOUND VAC;  Surgeon: Katha Cabal, MD;  Location: [facility];  Service: Vascular;  Laterality: Left;  . AV FISTULA PLACEMENT  2012   BELIEVED WAS PLACED IN JUNE  . AV FISTULA PLACEMENT Right 08/09/2017   Procedure: Creation Right  arm ARTERIOVENOUS BRACHIOCEPOHALIC FISTULA;  Surgeon: Elam Dutch, MD;  Location: [facility];  Service: Vascular;  Laterality: Right;  . AV FISTULA PLACEMENT Right 11/22/2017   Procedure: INSERTION OF ARTERIOVENOUS (AV) GORE-TEX GRAFT RIGHT UPPER ARM;  Surgeon: Elam Dutch, MD;  Location: [place];  Service: Vascular;  Laterality: Right;  . BIOPSY  01/25/2018   Procedure: BIOPSY;  Surgeon: Jerene Bears, MD;  Location: [place];  Service: Gastroenterology;;  . BIOPSY  04/10/2019   Procedure: BIOPSY;  Surgeon: Jerene Bears, MD;  Location:  [facility];  Service: Gastroenterology;;  . COLONOSCOPY    . COLONOSCOPY WITH PROPOFOL N/A 01/25/2018   Procedure: COLONOSCOPY WITH PROPOFOL;  Surgeon: Jerene Bears, MD;  Location: [place];  Service: Gastroenterology;  Laterality: N/A;  . CORONARY STENT INTERVENTION N/A 02/22/2017   Procedure: CORONARY STENT INTERVENTION;  Surgeon: Nigel Mormon, MD;  Location: [place] [facility];  Service: Cardiovascular;  Laterality: N/A;  . ESOPHAGOGASTRODUODENOSCOPY (EGD) WITH PROPOFOL N/A 01/25/2018   Procedure: ESOPHAGOGASTRODUODENOSCOPY (EGD) WITH PROPOFOL;  Surgeon: Jerene Bears, MD;  Location: [place];  Service: Gastroenterology;  Laterality: N/A;  . ESOPHAGOGASTRODUODENOSCOPY (EGD) WITH PROPOFOL N/A 04/10/2019   Procedure: ESOPHAGOGASTRODUODENOSCOPY (EGD) WITH PROPOFOL;  Surgeon: Jerene Bears, MD;  [facility]: [facility];  Service: Gastroenterology;  Laterality: N/A;  . FLEXIBLE SIGMOIDOSCOPY N/A 07/15/2017   Procedure: FLEXIBLE SIGMOIDOSCOPY;  Surgeon: Carol Ada, MD;  Location: [place];  Service: Endoscopy;  Laterality: N/A;  . HEMORRHOID BANDING    . I & D EXTREMITY Left 06/01/2017   Procedure: IRRIGATION AND DEBRIDEMENT LEFT ARM HEMATOMA WITH LIGATION OF LEFT ARM AV FISTULA;  Surgeon: Elam Dutch, MD;  Location: [place];  Service: Vascular;  Laterality: Left;  . I & D EXTREMITY Left 06/14/2017   Procedure: IRRIGATION AND DEBRIDEMENT EXTREMITY;  Surgeon: Katha Cabal, MD;  Location: [facility];  Service: Vascular;  Laterality: Left;  . INSERTION OF DIALYSIS CATHETER  05/30/2017  . INSERTION OF DIALYSIS CATHETER N/A 05/30/2017   Procedure: INSERTION OF DIALYSIS CATHETER;  Surgeon: Elam Dutch, MD;  Location: [place];  Service: Vascular;  Laterality: N/A;  . IR PARACENTESIS  08/30/2017  . IR PARACENTESIS  09/29/2017  . IR PARACENTESIS  10/28/2017  . IR PARACENTESIS  11/09/2017  . IR PARACENTESIS  11/16/2017  . IR PARACENTESIS  11/28/2017  . IR PARACENTESIS  12/01/2017   . IR PARACENTESIS  12/06/2017  . IR PARACENTESIS  01/03/2018  . IR PARACENTESIS  01/23/2018  . IR PARACENTESIS  02/07/2018  . IR PARACENTESIS  02/21/2018  . IR PARACENTESIS  03/06/2018  . IR PARACENTESIS  03/17/2018  . IR PARACENTESIS  04/04/2018  . IR PARACENTESIS  12/28/2018  . IR PARACENTESIS  01/08/2019  . IR PARACENTESIS  01/23/2019  . IR PARACENTESIS  02/01/2019  . IR PARACENTESIS  02/19/2019  . IR PARACENTESIS  03/01/2019  . IR PARACENTESIS  03/15/2019  . IR PARACENTESIS  04/03/2019  . IR PARACENTESIS  04/12/2019  . IR PARACENTESIS  05/01/2019  . IR PARACENTESIS  05/08/2019  . IR PARACENTESIS  05/24/2019  . IR PARACENTESIS  06/12/2019  . IR PARACENTESIS  07/09/2019  . IR PARACENTESIS  07/27/2019  . IR PARACENTESIS  08/09/2019  . IR PARACENTESIS  08/21/2019  . IR RADIOLOGIST EVAL & MGMT  02/14/2018  . IR RADIOLOGIST EVAL & MGMT  02/22/2019  . LEFT HEART CATH AND CORONARY ANGIOGRAPHY N/A 02/22/2017   Procedure: LEFT HEART CATH AND CORONARY ANGIOGRAPHY;  Surgeon: Nigel Mormon, MD;  Location: [place] [facility];  Service: Cardiovascular;  Laterality: N/A;  . LIGATION OF ARTERIOVENOUS  FISTULA Left 02/13/1323   Procedure: Plication of Left Arm Arteriovenous Fistula;  Surgeon: Elam Dutch, MD;  Location: [place];  Service: Vascular;  Laterality: Left;  . POLYPECTOMY    . POLYPECTOMY  01/25/2018   Procedure: POLYPECTOMY;  Surgeon: Jerene Bears, MD;  Location: [place];  Service: Gastroenterology;;  . REVISON OF ARTERIOVENOUS FISTULA Left 10/11/270   Procedure: PLICATION OF DISTAL ANEURYSMAL SEGEMENT OF LEFT UPPER ARM ARTERIOVENOUS FISTULA;  Surgeon: Elam Dutch, MD;  Location: [place];  Service: Vascular;  Laterality: Left;  . REVISON OF ARTERIOVENOUS FISTULA Left 5/36/6440   Procedure: Plication of Left Upper Arm Fistula ;  Surgeon: Waynetta Sandy, MD;  Location: [place];  Service: Vascular;  Laterality: Left;  . SKIN GRAFT SPLIT THICKNESS LEG / FOOT Left    SKIN GRAFT SPLIT  THICKNESS LEFT ARM DONOR SITE: LEFT ANTERIOR THIGH  . SKIN SPLIT GRAFT Left 07/04/2017   Procedure: SKIN GRAFT SPLIT THICKNESS LEFT ARM DONOR SITE: LEFT ANTERIOR THIGH;  Surgeon: Elam Dutch, MD;  Location: [place];  Service: Vascular;  Laterality: Left;  . THROMBECTOMY W/ EMBOLECTOMY Left 06/05/2017   Procedure: EXPLORATION OF LEFT ARM FOR BLEEDING; OVERSEWED PROXIMAL FISTULA;  Surgeon: Angelia Mould, MD;  Location: [place];  Service: Vascular;  Laterality: Left;  . WOUND EXPLORATION Left 06/03/2017   Procedure: WOUND EXPLORATION WITH WOUND VAC APPLICATION TO LEFT ARM;  Surgeon: Angelia Mould, MD;  Location: [facility];  Service: Vascular;  Laterality: Left;     Current Outpatient Medications  Medication Sig Dispense Refill  . albuterol (VENTOLIN HFA) 108 (90 Base) MCG/ACT inhaler Inhale 2 puffs into the lungs every 6 (six) hours as needed for wheezing or shortness of breath.    . hydrALAZINE (APRESOLINE) 100 MG tablet Take 100 mg by mouth 2 (two) times daily.    . hydrOXYzine (ATARAX/VISTARIL) 25 MG tablet Take 25 mg by mouth 2 (two) times daily as needed.    . loperamide (IMODIUM A-D) 2 MG tablet Take 2 mg by mouth 4 (four) times daily as needed for diarrhea or loose stools.    . metoprolol tartrate (LOPRESSOR) 100 MG tablet Take 1 tablet (100 mg total) by mouth 2 (two) times daily. 60 tablet 0  . montelukast (SINGULAIR) 10 MG tablet Take 10 mg by mouth every evening.     . ondansetron (ZOFRAN) 8 MG tablet Take 8 mg by mouth every 4 (four) hours as needed for nausea.     . Oxycodone HCl 10 MG TABS Take 10 mg by mouth 3 (three) times daily as needed.    . pantoprazole (PROTONIX) 40 MG tablet Take 40 mg by mouth daily.    . sevelamer carbonate (RENVELA) 800 MG tablet TAKE 4 TABS WITH MEALS AND 2 TABS TWICE A DAY WITH SNACKS    . sulfamethoxazole-trimethoprim (BACTRIM DS) 800-160 MG tablet Take 1 tablet by mouth daily. 30 tablet 11  . SYMBICORT 160-4.5 MCG/ACT inhaler Inhale 2  puffs into the lungs 2 (two) times daily.     Marland Kitchen zolpidem (AMBIEN) 10 MG tablet Take 10 mg by mouth at bedtime as needed for sleep.     No current facility-administered medications for this visit.    Allergies:   Morphine and related, Aspirin, Clonidine derivatives, Tramadol, and Tylenol [acetaminophen]   Social History:  The patient  reports that  he has been smoking cigarettes. He started smoking about 46 years ago. He has a 21.50 pack-year smoking history. He has never used smokeless tobacco. He reports previous alcohol use. He reports previous drug use. Drugs: Marijuana and Cocaine.   Family History:  The patient's family history includes COPD in his father and another family member; Esophageal cancer in his sister; Heart disease in his father and mother; Hypertension in an other family member; Lung cancer in his mother; Malignant hyperthermia in his father; Throat cancer in his sister.    ROS:  Please see the history of present illness.   Otherwise, review of systems is positive for none.   All other systems are reviewed and negative.    PHYSICAL EXAM: VS:  BP 132/88   Pulse 84   Ht 5\' 9"  (1.753 m)   Wt 142 lb 6.4 oz (64.6 kg)   SpO2 95%   BMI 21.03 kg/m  , BMI Body mass index is 21.03 kg/m. GEN: Well nourished, well developed, in no acute distress  HEENT: normal  Neck: no JVD, carotid bruits, or masses Cardiac: RRR; no murmurs, rubs, or gallops,no edema  Respiratory:  clear to auscultation bilaterally, normal work of breathing GI: soft, nontender, nondistended, + BS MS: no deformity or atrophy  Skin: warm and dry Neuro:  Strength and sensation are intact Psych: euthymic mood, full affect  EKG:  EKG is not ordered today. Personal review of the ekg ordered 08/16/19 shows rhythm, left bundle branch block  Recent Labs: 05/29/2019: B Natriuretic Peptide >4,500.0 08/08/2019: ALT 12 08/12/2019: BUN 23; Creatinine, Ser 6.12; Hemoglobin 9.8; Magnesium 1.6; Platelets 161; Potassium  4.1; Sodium 141    Lipid Panel     Component Value Date/Time   CHOL 103 08/17/2016 0134   TRIG 97 08/17/2016 0134   HDL 34 (L) 08/17/2016 0134   CHOLHDL 3.0 08/17/2016 0134   VLDL 19 08/17/2016 0134   LDLCALC 50 08/17/2016 0134     Wt Readings from Last 3 Encounters:  08/28/19 142 lb 6.4 oz (64.6 kg)  08/16/19 146 lb 14.4 oz (66.6 kg)  08/12/19 149 lb 14.6 oz (68 kg)      Other studies Reviewed: Additional studies/ records that were reviewed today include: TTE 08/30/17  Review of the above records today demonstrates:  - Left ventricle: The cavity size was normal. There was severe  concentric hypertrophy. Systolic function was normal. The  estimated ejection fraction was in the range of 55% to 60%.  Hypokinesis of the anteroseptal and inferoseptal myocardium.  - Ventricular septum: The contour showed diastolic flattening and  systolic flattening consistent with right ventricular pressure  and volume overload.  - Aortic valve: Transvalvular velocity was within the normal range.  There was no stenosis. There was no regurgitation.  - Mitral valve: Calcified annulus. Moderately thickened leaflets .  Mobility was restricted. The findings are consistent with severe  stenosis. There was mild regurgitation. Mean gradient (D): 16 mm  Hg. Valve area by pressure half-time: 2.01 cm^2.  - Left atrium: The atrium was severely dilated.  - Right ventricle: The cavity size was severely dilated. Wall  thickness was normal. Systolic function was normal.  - Right atrium: The atrium was severely dilated.  - Atrial septum: No defect or patent foramen ovale was identified  by color flow Doppler.  - Tricuspid valve: There was moderate regurgitation.  - Pulmonary arteries: PA peak pressure: 91 mm Hg (S).  - Pericardium, extracardiac: A moderate pericardial effusion was  identified along  the left ventricular free wall measuring up to  1.64 cm. Unchanged compared with the  echo 05/2017. There was no  chamber collapse. There was evidence for increased RV-LV  interaction demonstrated by respirophasic changes in transmitral  velocities and tricuspid velocities. The possibility of  hemodynamic compromise cannot be excluded on the basis of  available images.    ASSESSMENT AND PLAN:  1.  Paroxysmal atrial fibrillation: CHA2DS2-VASc of at least 2.  Currently not anticoagulated.  Due to his history of both liver and kidney disease, would hold off on anticoagulation with Xarelto or Eliquis.  If he was to be anticoagulated, it seems that warfarin is just about his only option.  Prior to consideration of ablation, I would need to ensure that he was compliant with his warfarin therapy with consistent INRs.  Despite that, he is certainly not an ideal candidate for ablation with all of his medical problems including end-stage liver disease, end-stage renal disease, and severe COPD.  I Xander Jutras discuss this further with his primary cardiologist prior to initiation of warfarin.  2.  Hypertension: well controlled  3.  Coronary artery disease status post RCA stent: No current chest pain.  Continue with current management.    Current medicines are reviewed at length with the patient today.   The patient does not have concerns regarding his medicines.  The following changes were made today:  none  Labs/ tests ordered today include:  No orders of the defined types were placed in this encounter.    Disposition:   FU with Anica Alcaraz pending discussion with cardiologist  Signed, Beyonce Sawatzky Meredith Leeds, MD  08/28/2019 9:09 AM     [facility] [address] [place] [address] [phone] (office) [phone] (fax)

## 2019-08-28 NOTE — Telephone Encounter (Signed)
Left message informing pt that Dr. Einar Gip and Dr. Curt Bears spoke.  Pt to follow up w/ [place],  PRN w/ [place].

## 2019-08-28 NOTE — Patient Instructions (Signed)
Medication Instructions:  Your physician recommends that you continue on your current medications as directed. Please refer to the Current Medication list given to you today.  *If you need a refill on your cardiac medications before your next appointment, please call your pharmacy*  Lab Work: None ordered If you have labs (blood work) drawn today and your tests are completely normal, you will receive your results only by: Marland Kitchen MyChart Message (if you have MyChart) OR . A paper copy in the mail If you have any lab test that is abnormal or we need to change your treatment, we will call you to review the results.  Testing/Procedures: None ordered  Follow-Up: At [facility], you and your health needs are our priority.  As part of our continuing mission to provide you with exceptional heart care, we have created designated Provider Care Teams.  These Care Teams include your primary Cardiologist (physician) and Advanced Practice Providers (APPs -  Physician Assistants and Nurse Practitioners) who all work together to provide you with the care you need, when you need it.  Your next appointment:   To be determined.  Dr. Curt Bears is going to discuss your case further with Dr. Einar Gip

## 2019-09-05 IMAGING — US IR PARACENTESIS
1 series · 3 of 3 positions shown · non-contrast
Comparison: none

INDICATION: History of alcoholic cirrhosis. Recurrent ascites. Request for
diagnostic and therapeutic paracentesis.

[Series 1: ir (id) (id)/(id)/(id) ir · 3 of 3 slices shown]
[im 1/3]
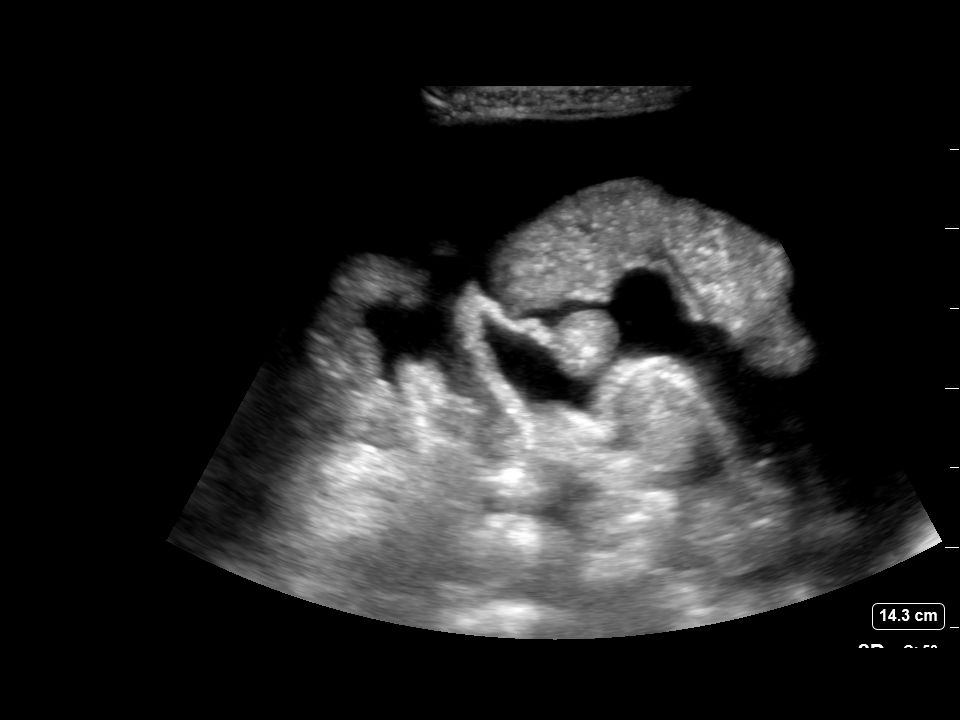
[im 2/3]
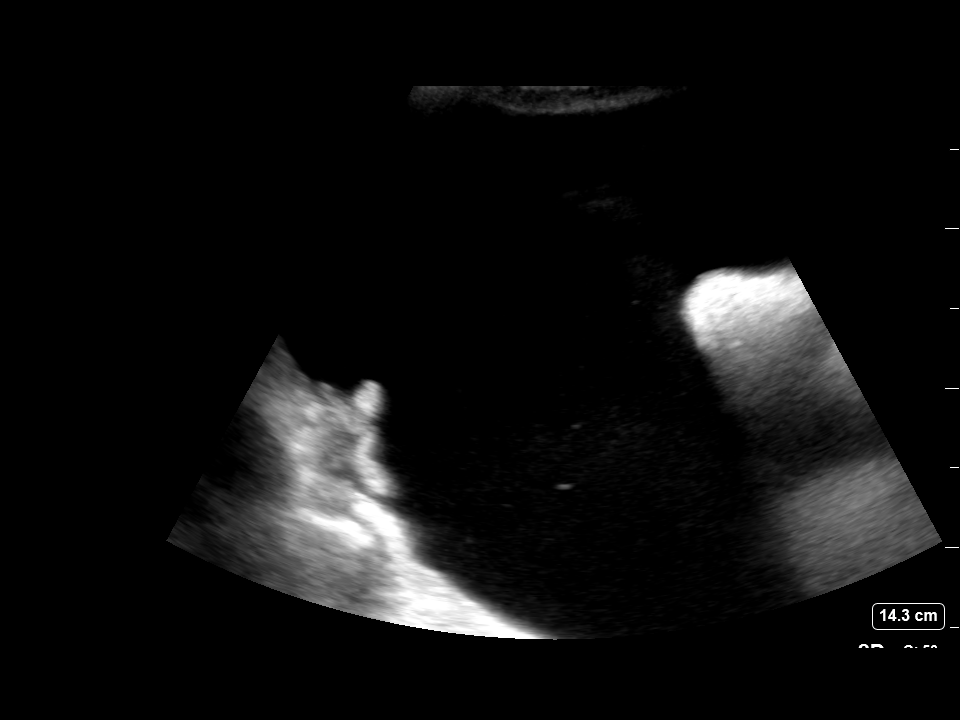
[im 3/3]
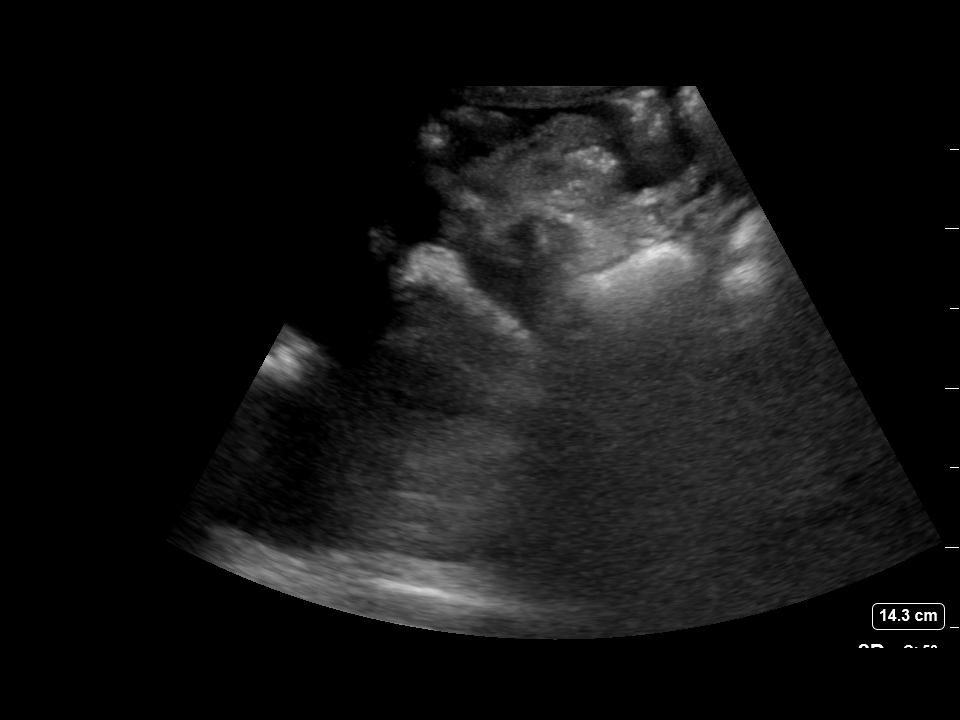

[3 of 3 positions shown; findings below may reference images not displayed]

EXAM:
ULTRASOUND GUIDED LEFT LOWER QUADRANT PARACENTESIS

MEDICATIONS:
None.

COMPLICATIONS:
None immediate.

PROCEDURE:
Informed written consent was obtained from the patient after a
discussion of the risks, benefits and alternatives to treatment. A
timeout was performed prior to the initiation of the procedure.

Initial ultrasound scanning demonstrates a large amount of ascites
within the left lower abdominal quadrant. The left lower abdomen was
prepped and draped in the usual sterile fashion. 1% lidocaine was
used for local anesthesia.

Following this, a 19 gauge, 7-cm, Yueh catheter was introduced. An
ultrasound image was saved for documentation purposes. The
paracentesis was performed. The catheter was removed and a dressing
was applied. The patient tolerated the procedure well without
immediate post procedural complication.
FINDINGS: A total of approximately 4.3 L of clear yellow fluid was removed.
Samples were sent to the laboratory as requested by the clinical
team.
IMPRESSION: Successful ultrasound-guided paracentesis yielding 4.3 liters of
peritoneal fluid.

## 2019-09-05 IMAGING — DX DG CHEST 2V
2 series · 2 of 2 positions shown · non-contrast
Comparison: 02/17/2018

CLINICAL DATA: Shortness of breath,

EXAM:
CHEST - 2 VIEW

[chest ap]
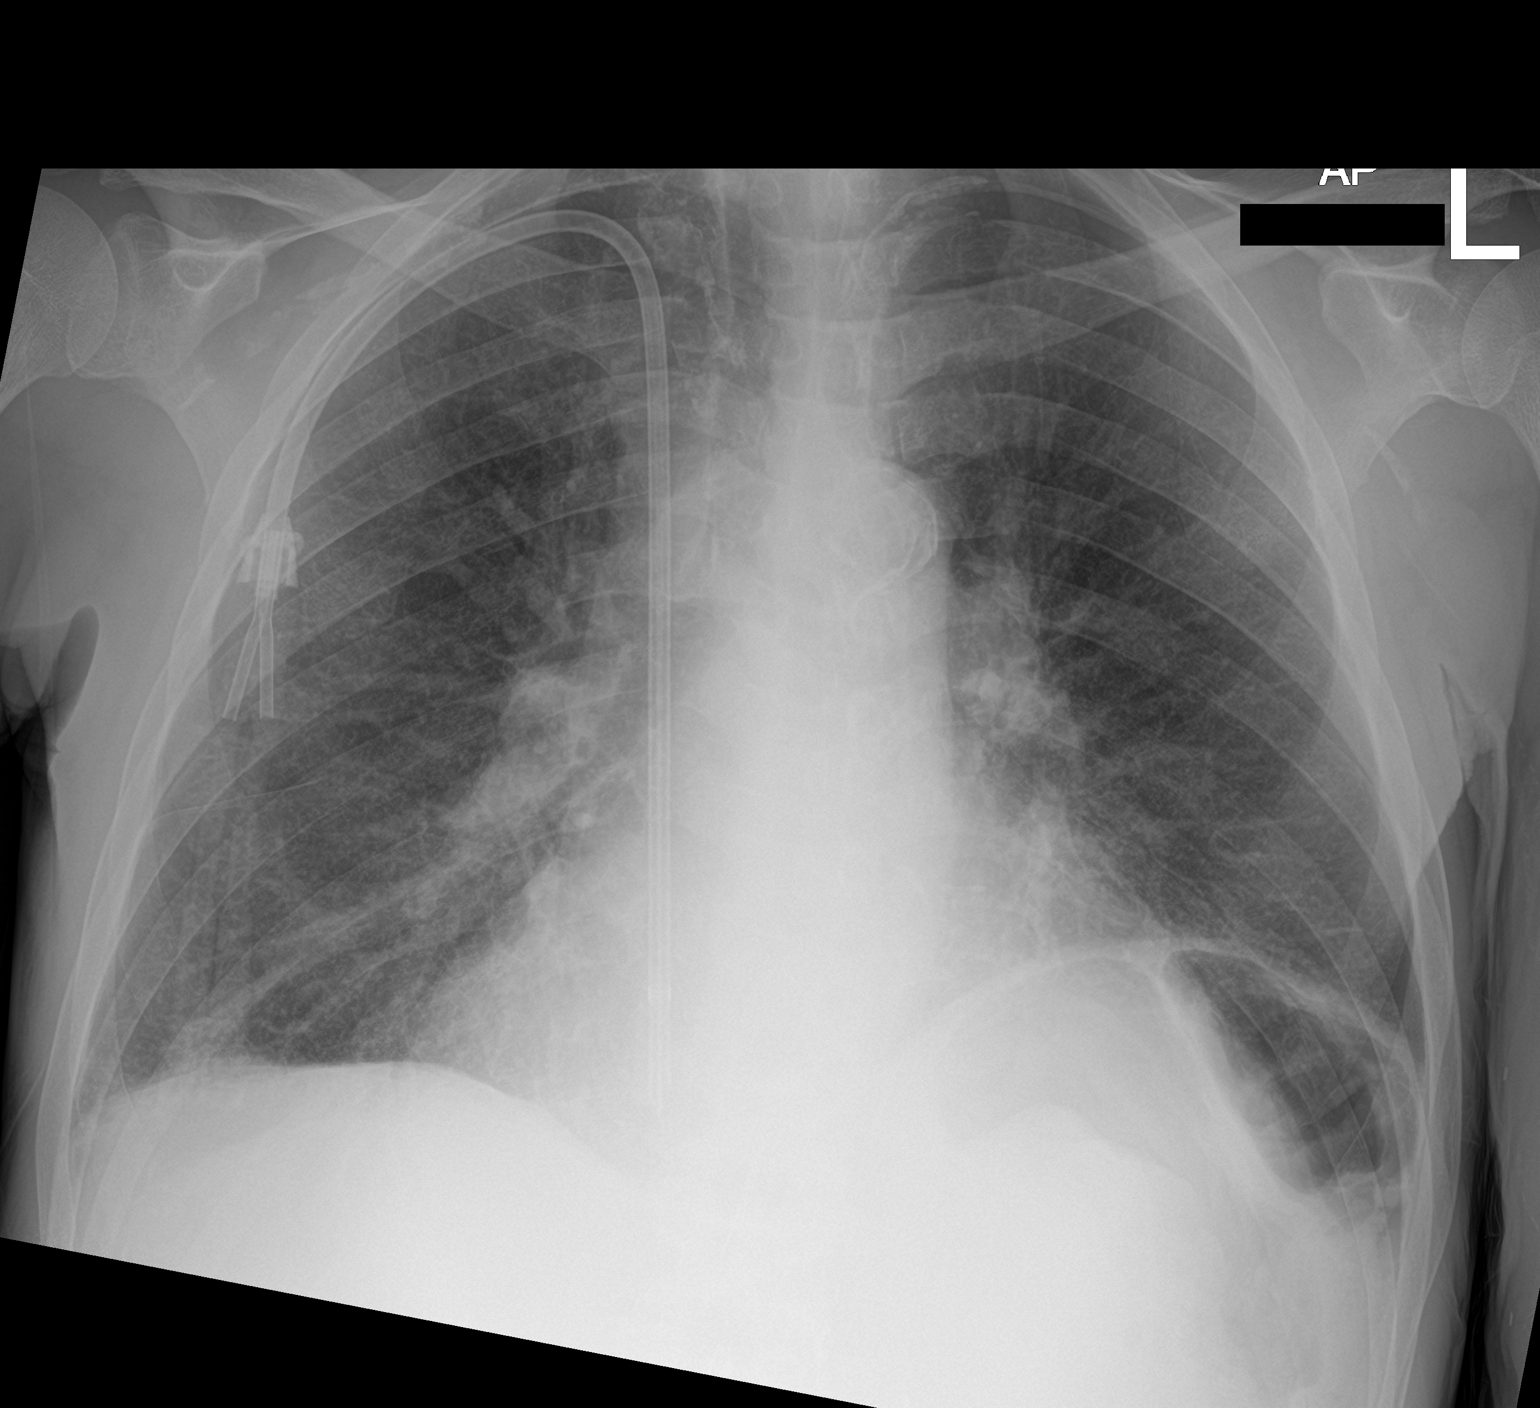

[chest lat]
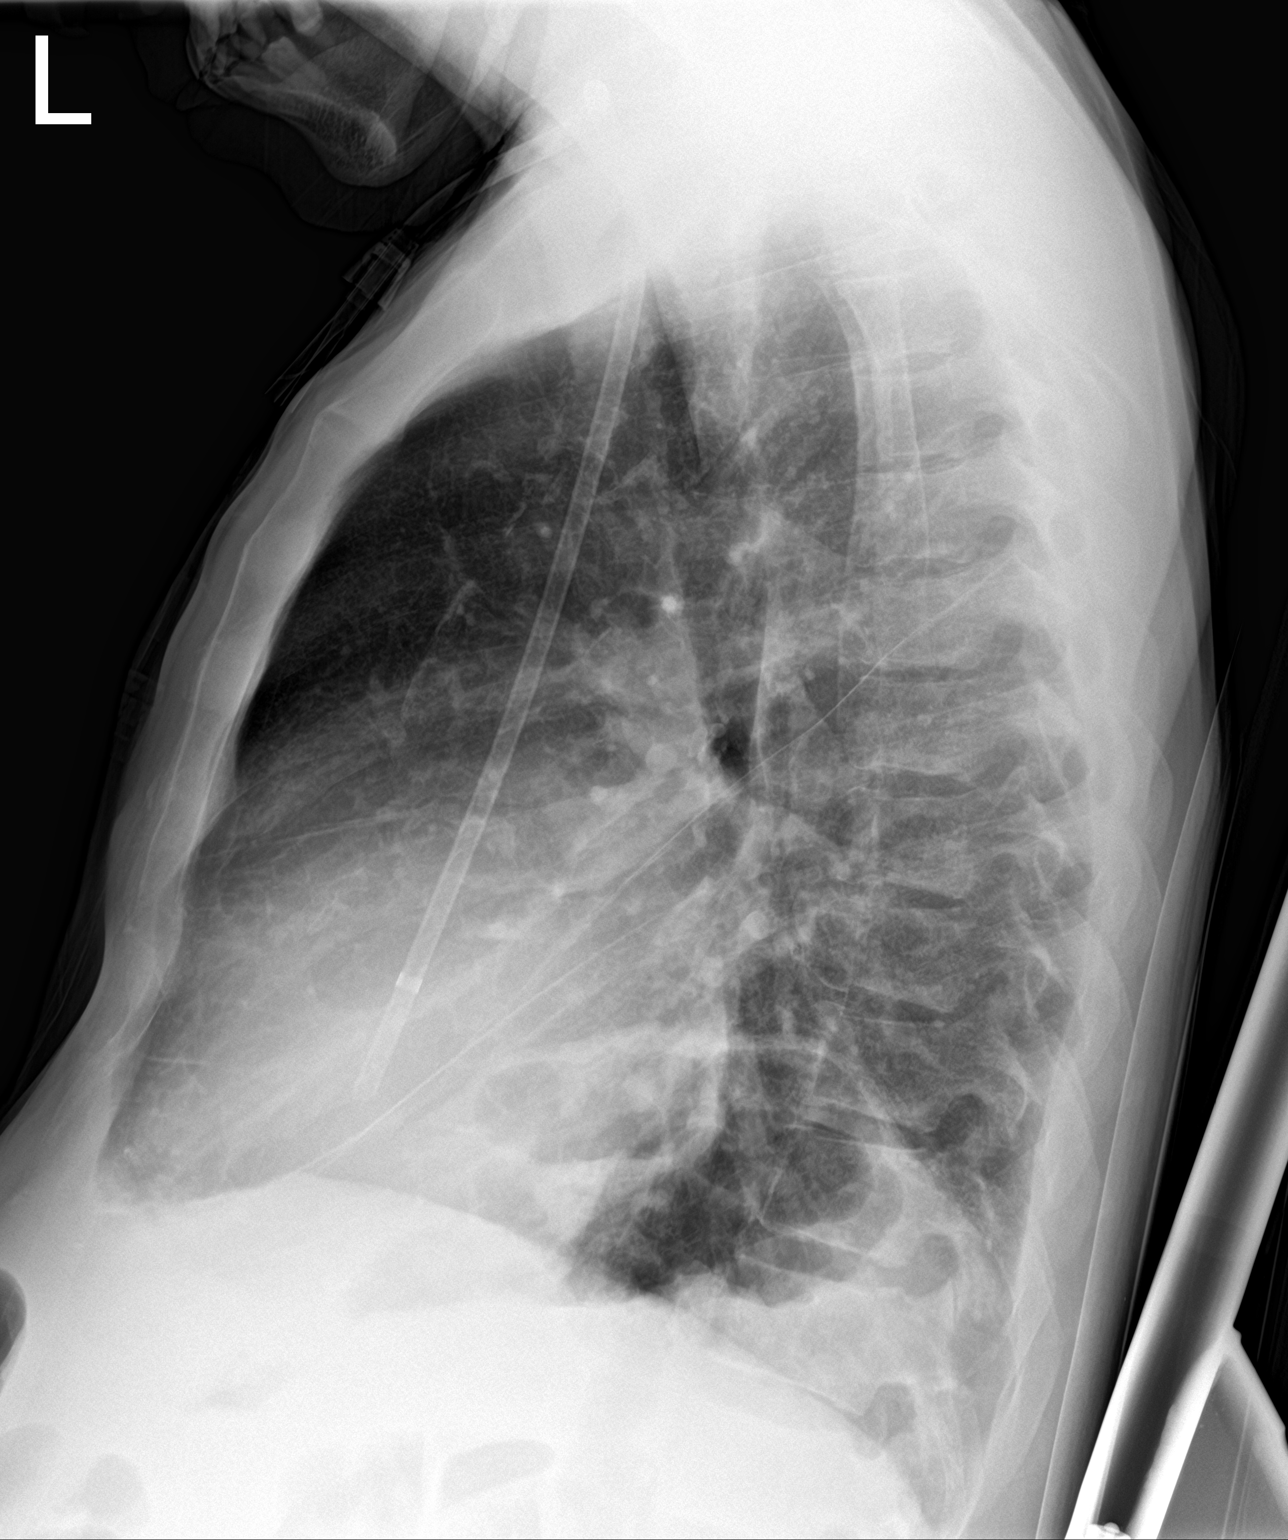

[2 of 2 positions shown; findings below may reference images not displayed]

FINDINGS: Right dialysis catheter remains in place, unchanged. Cardiomegaly.
Slight interstitial prominence, improved since prior study, likely
improving interstitial edema. Bibasilar atelectasis. No effusions or
acute bony abnormality.
IMPRESSION: Improving interstitial edema pattern.  Bibasilar atelectasis.

## 2019-09-09 IMAGING — DX DG CHEST 2V
2 series · 2 of 2 positions shown · non-contrast
Comparison: Chest x-ray dated March 06, 2018.

CLINICAL DATA: Shortness of breath.

EXAM:
CHEST - 2 VIEW

[w chest lat]
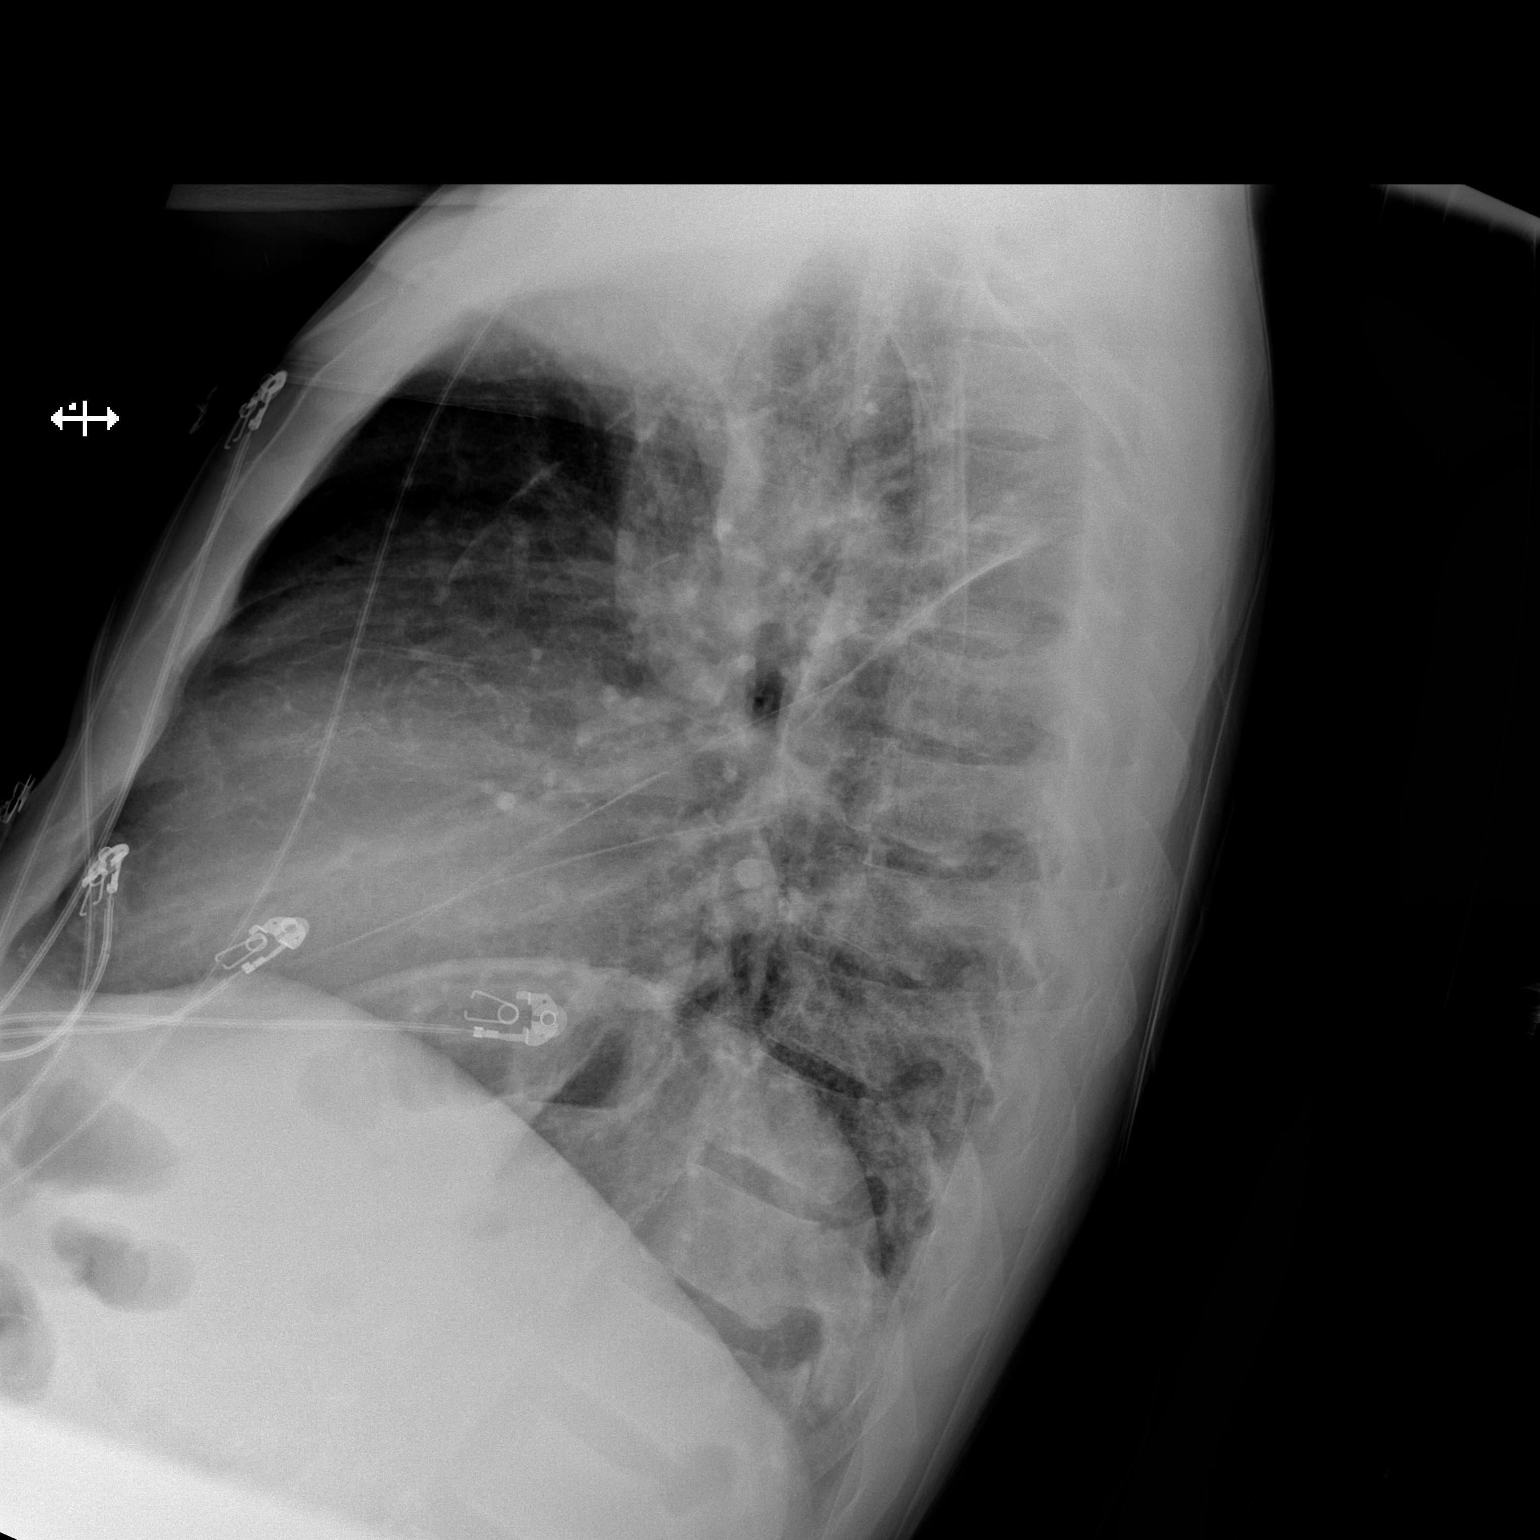

[x chest ap]
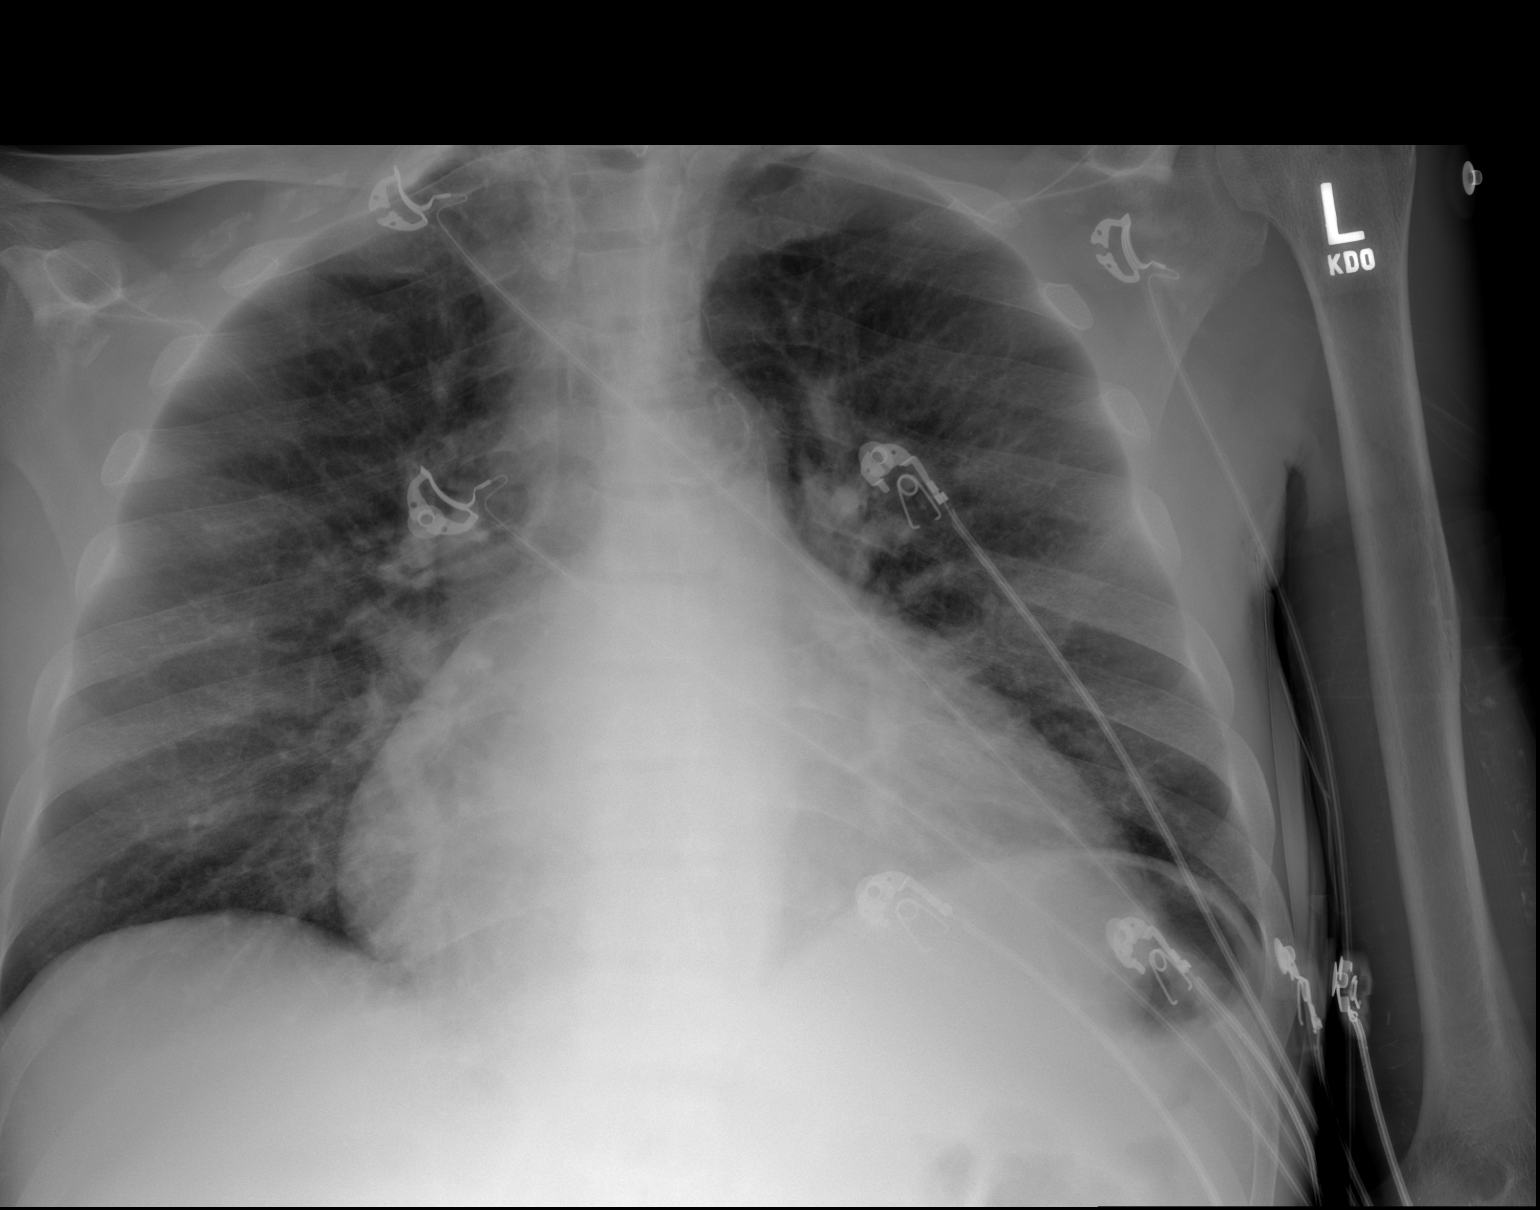

[2 of 2 positions shown; findings below may reference images not displayed]

FINDINGS: Interval removal of the right internal jugular dialysis catheter.
Stable cardiomegaly. Mild pulmonary vascular congestion with
resolved interstitial edema. No focal consolidation, pleural
effusion, or pneumothorax. No acute osseous abnormality.
IMPRESSION: Mild pulmonary vascular congestion with resolved interstitial edema.

## 2019-09-12 ENCOUNTER — Ambulatory Visit: Payer: Medicare Other | Admitting: Cardiology

## 2019-09-13 ENCOUNTER — Telehealth: Payer: Self-pay | Admitting: Gastroenterology

## 2019-09-13 DIAGNOSIS — R188 Other ascites: Secondary | ICD-10-CM

## 2019-09-13 DIAGNOSIS — K7460 Unspecified cirrhosis of liver: Secondary | ICD-10-CM

## 2019-09-13 NOTE — Telephone Encounter (Signed)
The pt has been advised that he can call and make appt for paracentesis.  The order is in [place] per Alonza Bogus PA

## 2019-09-13 NOTE — Telephone Encounter (Signed)
Pt requested to have a paracentesis.

## 2019-09-14 ENCOUNTER — Ambulatory Visit: Payer: Medicare Other | Admitting: Cardiology

## 2019-09-14 NOTE — Progress Notes (Deleted)
Primary Physician:  [name], FNP   Patient ID: [name], male    DOB: [birth year], 59 y.o.   MRN: [id number]  Subjective:    No chief complaint on file.   HPI: [name]  is a 59 y.o. male  with very complex medical problemsincluding severe COPD and chronic cor pulmonale with pulmonary hypertension, coronary artery disease s/p stenting to mid RCA in 2018, paroxysmal atrial fibrillation with RVR, hypertensive cardiomyopathy, dietary and medication noncompliance, end-stage renal disease on hemodialysis, chronic hepatitis B and C, tobacco use, smokes 1/2 ppd, h/o marijuana use and prior cocaine use quit in 2017. Not on anticoagulation due to cirrhosis of liver and compliance with Warfarin. He has had multiple ER visits for A fib with RVR, syncope, and hyperkalemia over the last few months. He had been on amiodarone; however, patient discontinued on 08/24/19 as he felt that this made A fib worse. Started on Diltiazem at his last visit with [place]. Since last seen by [place], he has been evaluated by EP.  Felt to likely not be a good candidate for ablation in view of his multiple medical problems; however, could consider this if patient were to be compliant with warfarin therapy.  Due to his liver and kidney disease, Xarelto and Eliquis not felt to be a good option.   He denies any shortness of breath. Patient denies any complaints of chest pain, tightness or pressure. No orthopnea or PND. No history of swelling on the legs. No history of severe dizziness, near-syncope or syncope. No history of any active bleeding.  Past Medical History:  Diagnosis Date  . Anemia   . Anxiety   . Arthritis    left shoulder  . Atherosclerosis of aorta ([place])   . Cardiomegaly   . Chest pain    DATE UNKNOWN, C/O PERIODICALLY  . Cocaine abuse ([place])   . COPD exacerbation ([place]) 08/17/2016  . Coronary artery disease    stent 02/22/17  . ESRD (end stage renal disease) on dialysis ([place])    "[name]; MWF" (07/04/2017)  . GERD (gastroesophageal reflux disease)    DATE UNKNOWN  . Hemorrhoids   . Hepatitis B, chronic ([place])   . Hepatitis C   . History of kidney stones   . Hyperkalemia   . Hypertension   . Kidney failure   . Metabolic bone disease    Patient denies  . Mitral stenosis   . Myocardial infarction ([place])   . Pneumonia   . Pulmonary edema   . Solitary rectal ulcer syndrome 07/2017   at [facility] for rectal bleeding  . Tubular adenoma of colon     Past Surgical History:  Procedure Laterality Date  . A/V FISTULAGRAM Left 05/26/2017   Procedure: A/V FISTULAGRAM;  Surgeon: [employer], MD;  Location: [facility];  Service: Cardiovascular;  Laterality: Left;  . A/V FISTULAGRAM Right 11/18/2017   Procedure: A/V FISTULAGRAM - Right Arm;  Surgeon: Elam Dutch, MD;  Location: [facility];  Service: Cardiovascular;  Laterality: Right;  . APPLICATION OF WOUND VAC Left 06/14/2017   Procedure: APPLICATION OF WOUND VAC;  Surgeon: Katha Cabal, MD;  Location: [facility];  Service: Vascular;  Laterality: Left;  . AV FISTULA PLACEMENT  2012   BELIEVED WAS PLACED IN JUNE  . AV FISTULA PLACEMENT Right 08/09/2017   Procedure: Creation Right arm ARTERIOVENOUS BRACHIOCEPOHALIC FISTULA;  Surgeon: Elam Dutch, MD;  Location: [facility];  Service:  Vascular;  Laterality: Right;  . AV FISTULA PLACEMENT Right 11/22/2017   Procedure: INSERTION OF ARTERIOVENOUS (AV) GORE-TEX GRAFT RIGHT UPPER ARM;  Surgeon: Elam Dutch, MD;  Location: [facility];  Service: Vascular;  Laterality: Right;  . BIOPSY  01/25/2018   Procedure: BIOPSY;  Surgeon: Jerene Bears, MD;  Location: [facility];  Service: Gastroenterology;;  . BIOPSY  04/10/2019   Procedure: BIOPSY;  Surgeon: Jerene Bears, MD;  Location: [facility];  Service: Gastroenterology;;  . COLONOSCOPY    . COLONOSCOPY WITH PROPOFOL N/A 01/25/2018   Procedure: COLONOSCOPY WITH PROPOFOL;  Surgeon: Jerene Bears, MD;   Location: [place] [facility];  Service: Gastroenterology;  Laterality: N/A;  . CORONARY STENT INTERVENTION N/A 02/22/2017   Procedure: CORONARY STENT INTERVENTION;  Surgeon: Nigel Mormon, MD;  Location: [facility];  Service: Cardiovascular;  Laterality: N/A;  . ESOPHAGOGASTRODUODENOSCOPY (EGD) WITH PROPOFOL N/A 01/25/2018   Procedure: ESOPHAGOGASTRODUODENOSCOPY (EGD) WITH PROPOFOL;  Surgeon: Jerene Bears, MD;  Location: [place] [facility];  Service: Gastroenterology;  Laterality: N/A;  . ESOPHAGOGASTRODUODENOSCOPY (EGD) WITH PROPOFOL N/A 04/10/2019   Procedure: ESOPHAGOGASTRODUODENOSCOPY (EGD) WITH PROPOFOL;  Surgeon: Jerene Bears, MD;  [facility];  Service: Gastroenterology;  Laterality: N/A;  . FLEXIBLE SIGMOIDOSCOPY N/A 07/15/2017   Procedure: FLEXIBLE SIGMOIDOSCOPY;  Surgeon: Carol Ada, MD;  Location: [facility];  Service: Endoscopy;  Laterality: N/A;  . HEMORRHOID BANDING    . I & D EXTREMITY Left 06/01/2017   Procedure: IRRIGATION AND DEBRIDEMENT LEFT ARM HEMATOMA WITH LIGATION OF LEFT ARM AV FISTULA;  Surgeon: Elam Dutch, MD;  Location: [facility] [place];  Service: Vascular;  Laterality: Left;  . I & D EXTREMITY Left 06/14/2017   Procedure: IRRIGATION AND DEBRIDEMENT EXTREMITY;  Surgeon: Katha Cabal, MD;  Location: [facility];  Service: Vascular;  Laterality: Left;  . INSERTION OF DIALYSIS CATHETER  05/30/2017  . INSERTION OF DIALYSIS CATHETER N/A 05/30/2017   Procedure: INSERTION OF DIALYSIS CATHETER;  Surgeon: Elam Dutch, MD;  Location: [place];  Service: Vascular;  Laterality: N/A;  . IR PARACENTESIS  08/30/2017  . IR PARACENTESIS  09/29/2017  . IR PARACENTESIS  10/28/2017  . IR PARACENTESIS  11/09/2017  . IR PARACENTESIS  11/16/2017  . IR PARACENTESIS  11/28/2017  . IR PARACENTESIS  12/01/2017  . IR PARACENTESIS  12/06/2017  . IR PARACENTESIS  01/03/2018  . IR PARACENTESIS  01/23/2018  . IR PARACENTESIS  02/07/2018  . IR PARACENTESIS  02/21/2018  . IR PARACENTESIS   03/06/2018  . IR PARACENTESIS  03/17/2018  . IR PARACENTESIS  04/04/2018  . IR PARACENTESIS  12/28/2018  . IR PARACENTESIS  01/08/2019  . IR PARACENTESIS  01/23/2019  . IR PARACENTESIS  02/01/2019  . IR PARACENTESIS  02/19/2019  . IR PARACENTESIS  03/01/2019  . IR PARACENTESIS  03/15/2019  . IR PARACENTESIS  04/03/2019  . IR PARACENTESIS  04/12/2019  . IR PARACENTESIS  05/01/2019  . IR PARACENTESIS  05/08/2019  . IR PARACENTESIS  05/24/2019  . IR PARACENTESIS  06/12/2019  . IR PARACENTESIS  07/09/2019  . IR PARACENTESIS  07/27/2019  . IR PARACENTESIS  08/09/2019  . IR PARACENTESIS  08/21/2019  . IR RADIOLOGIST EVAL & MGMT  02/14/2018  . IR RADIOLOGIST EVAL & MGMT  02/22/2019  . LEFT HEART CATH AND CORONARY ANGIOGRAPHY N/A 02/22/2017   Procedure: LEFT HEART CATH AND CORONARY ANGIOGRAPHY;  Surgeon: Nigel Mormon, MD;  Location: [facility];  Service: Cardiovascular;  Laterality:  N/A;  . LIGATION OF ARTERIOVENOUS  FISTULA Left 11/16/8467   Procedure: Plication of Left Arm Arteriovenous Fistula;  Surgeon: Elam Dutch, MD;  Location: [place];  Service: Vascular;  Laterality: Left;  . POLYPECTOMY    . POLYPECTOMY  01/25/2018   Procedure: POLYPECTOMY;  Surgeon: Jerene Bears, MD;  Location: [facility];  Service: Gastroenterology;;  . REVISON OF ARTERIOVENOUS FISTULA Left 01/08/5283   Procedure: PLICATION OF DISTAL ANEURYSMAL SEGEMENT OF LEFT UPPER ARM ARTERIOVENOUS FISTULA;  Surgeon: Elam Dutch, MD;  Location: [place];  Service: Vascular;  Laterality: Left;  . REVISON OF ARTERIOVENOUS FISTULA Left 1/32/4401   Procedure: Plication of Left Upper Arm Fistula ;  Surgeon: Waynetta Sandy, MD;  Location: [facility];  Service: Vascular;  Laterality: Left;  . SKIN GRAFT SPLIT THICKNESS LEG / FOOT Left    SKIN GRAFT SPLIT THICKNESS LEFT ARM DONOR SITE: LEFT ANTERIOR THIGH  . SKIN SPLIT GRAFT Left 07/04/2017   Procedure: SKIN GRAFT SPLIT THICKNESS LEFT ARM DONOR SITE: LEFT ANTERIOR THIGH;   Surgeon: Elam Dutch, MD;  Location: [place];  Service: Vascular;  Laterality: Left;  . THROMBECTOMY W/ EMBOLECTOMY Left 06/05/2017   Procedure: EXPLORATION OF LEFT ARM FOR BLEEDING; OVERSEWED PROXIMAL FISTULA;  Surgeon: Angelia Mould, MD;  Location: [place];  Service: Vascular;  Laterality: Left;  . WOUND EXPLORATION Left 06/03/2017   Procedure: WOUND EXPLORATION WITH WOUND VAC APPLICATION TO LEFT ARM;  Surgeon: Angelia Mould, MD;  Location: [facility];  Service: Vascular;  Laterality: Left;    Social History   Socioeconomic History  . Marital status: Single    Spouse name: Not on file  . Number of children: 3  . Years of education: 10  . Highest education level: Not on file  Occupational History  . Occupation: Unemployed  Tobacco Use  . Smoking status: Current Every Day Smoker    Packs/day: 0.50    Years: 43.00    Pack years: 21.50    Types: Cigarettes    Start date: 08/13/1973  . Smokeless tobacco: Never Used  . Tobacco comment: i dont know i just make   Substance and Sexual Activity  . Alcohol use: Not Currently    Comment: quit drinking in 2017  . Drug use: Not Currently    Types: Marijuana, Cocaine    Comment: reports using once every 3 months, 04-06-2019 was this   . Sexual activity: Not on file  Other Topics Concern  . Not on file  Social History Narrative   Lives alone   Caffeine use: Coffee-rare   Soda- daily      [facility] (03/10/17):   Originally from [facility]. Previously worked trimming trees. No pets currently. No bird or mold exposure.    Social Determinants of Health   Financial Resource Strain:   . Difficulty of Paying Living Expenses: Not on file  Food Insecurity:   . Worried About Charity fundraiser in the Last Year: Not on file  . Ran Out of Food in the Last Year: Not on file  Transportation Needs:   . Lack of Transportation (Medical): Not on file  . Lack of Transportation (Non-Medical): Not on file  Physical Activity:   . Days  of Exercise per Week: Not on file  . Minutes of Exercise per Session: Not on file  Stress:   . Feeling of Stress : Not on file  Social Connections:   . Frequency of Communication with Friends and Family: Not  on file  . Frequency of Social Gatherings with Friends and Family: Not on file  . Attends Religious Services: Not on file  . Active Member of Clubs or Organizations: Not on file  . Attends Archivist Meetings: Not on file  . Marital Status: Not on file  Intimate Partner Violence:   . Fear of Current or Ex-Partner: Not on file  . Emotionally Abused: Not on file  . Physically Abused: Not on file  . Sexually Abused: Not on file   Review of Systems  Constitution: Negative for weight gain.  Cardiovascular: Positive for dyspnea on exertion (chronic) and palpitations. Negative for leg swelling and syncope.  Respiratory: Positive for wheezing. Negative for hemoptysis.   Endocrine: Negative for cold intolerance.  Hematologic/Lymphatic: Does not bruise/bleed easily.  Gastrointestinal: Negative for hematochezia and melena.   Objective:  There were no vitals taken for this visit. There is no height or weight on file to calculate BMI.   Vitals with BMI 08/28/2019 08/21/2019 08/16/2019  Height 5\' 9"  - -  Weight 142 lbs 6 oz - -  BMI 47.42 - -  Systolic 595 638 756  Diastolic 88 90 90  Pulse 84 - 87  Some encounter information is confidential and restricted. Go to Review Flowsheets activity to see all data.    Physical Exam  Constitutional: Vital signs are normal.  Cardiovascular: Normal rate and regular rhythm.  Murmur heard.  Early systolic murmur is present with a grade of 2/6 at the upper right sternal border.  Low-pitched rumbling crescendo presystolic murmur is present with a grade of 3/6 at the apex. S1 normal and S2 loud.  Right arm AV graft noted  Pulmonary/Chest: Effort normal and breath sounds normal. No accessory muscle usage. No respiratory distress.  Abdominal:  Soft. Bowel sounds are normal.  Musculoskeletal:        General: Normal range of motion.  Vitals reviewed.  Laboratory examination:   CMP Latest Ref Rng & Units 08/12/2019 08/10/2019 08/08/2019  Glucose 70 - 99 mg/dL 119(H) 130(H) 94  BUN 6 - 20 mg/dL 23(H) 30(H) 28(H)  Creatinine 0.61 - 1.24 mg/dL 6.12(H) 7.76(H) 7.84(H)  Sodium 135 - 145 mmol/L 141 137 138  Potassium 3.5 - 5.1 mmol/L 4.1 5.4(H) 5.3(H)  Chloride 98 - 111 mmol/L 106 99 98  CO2 22 - 32 mmol/L 20(L) 24 28  Calcium 8.9 - 10.3 mg/dL 8.0(L) 9.4 9.7  Total Protein 6.5 - 8.1 g/dL - - 6.7  Total Bilirubin 0.3 - 1.2 mg/dL - - 0.6  Alkaline Phos 38 - 126 U/L - - 85  AST 15 - 41 U/L - - 23  ALT 0 - 44 U/L - - 12   CBC Latest Ref Rng & Units 08/12/2019 08/10/2019 08/08/2019  WBC 4.0 - 10.5 K/uL 7.1 6.5 7.8  Hemoglobin 13.0 - 17.0 g/dL 9.8(L) 10.6(L) 10.4(L)  Hematocrit 39.0 - 52.0 % 28.4(L) 31.1(L) 30.6(L)  Platelets 150 - 400 K/uL 161 156 170   Lipid Panel     Component Value Date/Time   CHOL 103 08/17/2016 0134   TRIG 97 08/17/2016 0134   HDL 34 (L) 08/17/2016 0134   CHOLHDL 3.0 08/17/2016 0134   VLDL 19 08/17/2016 0134   LDLCALC 50 08/17/2016 0134   HEMOGLOBIN A1C Lab Results  Component Value Date   HGBA1C 4.6 (L) 04/24/2017   MPG 85.32 04/24/2017   TSH No results for input(s): TSH in the last 8760 hours.  PRN Meds:. There are no discontinued medications.  No outpatient medications have been marked as taking for the 09/14/19 encounter (Appointment) with Adrian Prows, MD.   Cardiac Studies:   Coronary Angiogram [02/22/2017]: No significant disease on left, mid RCA high-grade stenosis status post 3.5 x 23 mm Xience Alpine DES.  Carotid Doppler [10/28/2016]: Stenosis in the right common carotid artery (<50%) with severe heteregenous plaque. Stenosis in the left internal carotid artery (16-49%). Severe heteregenous plaque in the left common carotid artery with < 50% stenosis. Mild stenosis in the left external  carotid artery (<50%). Antegrade vertebral artery flow. Follow up in one year is appropriate if clinically indicated.  Echocardiogram 08/30/2017: Left ventricle is normal in size, severe LVH.  EF 55 to 60% with anteroseptal and inferoseptal hypokinesis. Ventricular septum shows diastolic flattening and systolic flattening consistent with right ventricular pressure and volume overload. Right ventricle is severely dilated.  Systolic function was normal. There is severe biatrial enlargement. Calcified mitral valve annulus.  Moderately thickened mitral valve leaflets with very mild restriction in mobility, suggest mild mitral stenosis. Mean gradient suggest severe mitral stenosis however calculated valve area indicates mild mitral valve stenosis. Mild mitral vegetation. No significant change from  03/14/2018.  Assessment:   No diagnosis found.  EKG 08/16/2019: Sinus rhythm with first-degree AV block at rate of 87 bpm, left atrial enlargement, left bundle branch block.  No further analysis.    EKG 08/01/19: Atrial fibrillation at 79 bpm, LBBB. No further analysis due to LBBB.  Recommendations:   Darald Uzzle  is a 59 y.o. male  with very complex medical problemsincluding severe COPD and chronic cor pulmonale with pulmonary hypertension, coronary artery disease s/p stenting to mid RCA in 2018, paroxysmal atrial fibrillation with RVR, hypertensive cardiomyopathy, dietary and medication noncompliance, end-stage renal disease on hemodialysis, chronic hepatitis B and C, tobacco use, smokes 1/2 ppd, h/o marijuana use and prior cocaine use quit in 2017. Not on anticoagulation due to cirrhosis of liver and compliance with Warfarin.   Extensive review of his medications and also his records from the hospital, in spite of medical complexities, he is presently doing well and there is no clinical evidence of heart failure.  Question is if we were to treat his atrial fibrillation he may do well  without precipitation of acute decompensated heart failure.  I would refer him to be evaluated by EP, we could consider at least short course of warfarin if he is willing to be compliant prior to A. fib ablation.  We could consider use of either Eliquis or Xarelto as well although he has end-stage renal disease to improve compliance as alternative in view of FDA approval.  For now I have added diltiazem CD to 40 mg daily both for hypertension control and for A. fib with RVR prevention.  Today he is in sinus rhythm.  I would like to see him back in 4 weeks for follow-up.  Echocardiogram does reveal preserved LVEF and severe hypertension heart disease, cor pulmonale with RV dilatation and RVH but preserved RV function.  Smoking cessation again discussed.  Miquel Dunn, MD, [facility] 09/14/2019, 9:28 AM [facility]. [place] Office: [phone]

## 2019-09-16 IMAGING — US IR PARACENTESIS
1 series · 2 of 2 positions shown · non-contrast
Comparison: none

INDICATION: Patient with history of recurrent ascites. Request made for
diagnostic and therapeutic paracentesis.

[Series 1: ir (id) (id)/(id)/(id) ir · 2 of 2 slices shown]
[im 1/2]
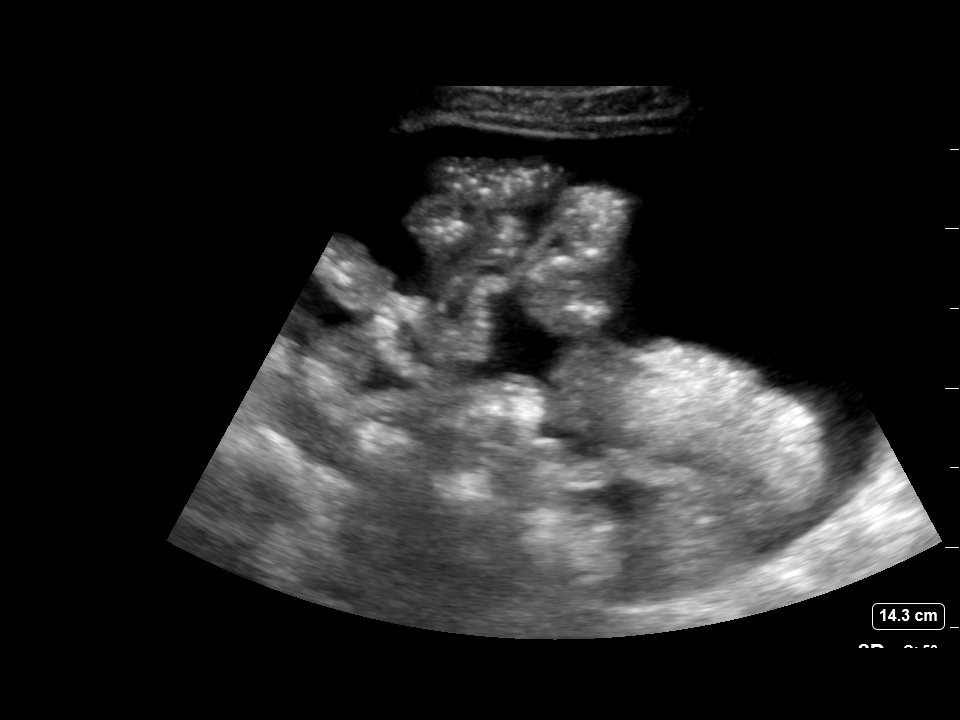
[im 2/2]
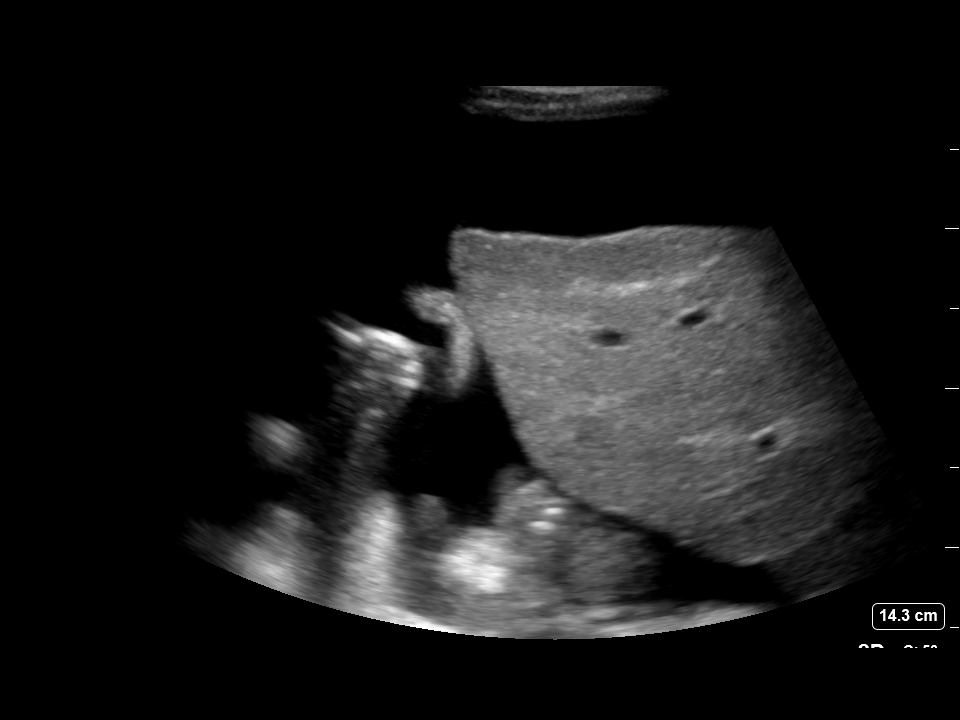

[2 of 2 positions shown; findings below may reference images not displayed]

EXAM:
ULTRASOUND GUIDED DIAGNOSTIC AND THERAPEUTIC PARACENTESIS

MEDICATIONS:
10 mL 2% lidocaine

COMPLICATIONS:
None immediate.

PROCEDURE:
Informed written consent was obtained from the patient after a
discussion of the risks, benefits and alternatives to treatment. A
timeout was performed prior to the initiation of the procedure.

Initial ultrasound scanning demonstrates a moderate amount of
ascites within the right lower abdominal quadrant. The right lower
abdomen was prepped and draped in the usual sterile fashion. 2%
lidocaine was used for local anesthesia.

Following this, a 19 gauge, 7-cm, Yueh catheter was introduced. An
ultrasound image was saved for documentation purposes. The
paracentesis was performed. The catheter was removed and a dressing
was applied. Procedure was stopped prior to removal of all fluid due
to hypotension of 66/51. A peripheral IV was placed and patient was
bolused 250 mL normal saline with improvement to blood pressure to
94/54. Patient was asymptomatic of shifts in blood pressure. No
additional complications noted and otherwise patient tolerated
procedure well.
FINDINGS: A total of approximately 3.0 liters of amber fluid was removed.
Samples were sent to the laboratory as requested by the clinical
team.
IMPRESSION: Successful ultrasound-guided paracentesis yielding 3.0 liters of
peritoneal fluid. Patient experienced hypotension during procedure
today requiring 250 mL NS bolus. Patient was asymptomatic of changes
to BP. He was discharged home in stable condition after improvement
in BP.

## 2019-09-17 ENCOUNTER — Other Ambulatory Visit: Payer: Self-pay

## 2019-09-17 ENCOUNTER — Telehealth: Payer: Self-pay | Admitting: Gastroenterology

## 2019-09-17 ENCOUNTER — Ambulatory Visit (HOSPITAL_COMMUNITY)
Admission: RE | Admit: 2019-09-17 | Discharge: 2019-09-17 | Disposition: A | Discharge status: Home / Self Care | Payer: Medicare Other | Source: Ambulatory Visit | Attending: Physician Assistant | Admitting: Physician Assistant

## 2019-09-17 DIAGNOSIS — R188 Other ascites: Secondary | ICD-10-CM | POA: Diagnosis present

## 2019-09-17 HISTORY — PX: IR PARACENTESIS: IMG2679

## 2019-09-17 LAB — BODY FLUID CELL COUNT WITH DIFFERENTIAL
Eos, Fluid: 0 %
Lymphs, Fluid: 63 %
Monocyte-Macrophage-Serous Fluid: 31 % — ABNORMAL LOW (ref 50–90)
Neutrophil Count, Fluid: 6 % (ref 0–25)
Total Nucleated Cell Count, Fluid: 95 cu mm (ref 0–1000)

## 2019-09-17 MED ORDER — LIDOCAINE HCL (PF) 1 % IJ SOLN
INTRAMUSCULAR | Status: DC | PRN
  Administered 2019-09-17: 10 mL

## 2019-09-17 MED ORDER — LIDOCAINE HCL 1 % IJ SOLN
INTRAMUSCULAR | Status: AC
  Filled 2019-09-17: qty 20

## 2019-09-17 NOTE — Procedures (Signed)
Ultrasound-guided diagnostic and therapeutic paracentesis performed yielding 2.8 liters of straw colored fluid.  Fluid was sent to lab for analysis. No immediate complications. EBL is none.

## 2019-09-17 NOTE — Telephone Encounter (Signed)
See alternate note  

## 2019-09-17 NOTE — Telephone Encounter (Signed)
Pt requested to have paracentesis today.  He stated that "he cannot make it until tomorrow."

## 2019-09-17 NOTE — Telephone Encounter (Signed)
Spoke with the pt and he has an appt for today at 11 for paracentesis.

## 2019-09-18 ENCOUNTER — Ambulatory Visit (HOSPITAL_COMMUNITY): Payer: Medicare Other

## 2019-09-19 LAB — PATHOLOGIST SMEAR REVIEW

## 2019-09-20 LAB — BODY FLUID CULTURE: Culture: NO GROWTH

## 2019-09-23 ENCOUNTER — Other Ambulatory Visit: Payer: Self-pay

## 2019-09-23 ENCOUNTER — Encounter (HOSPITAL_COMMUNITY): Payer: Self-pay

## 2019-09-23 ENCOUNTER — Emergency Department (HOSPITAL_COMMUNITY): Payer: Medicare Other

## 2019-09-23 ENCOUNTER — Emergency Department (HOSPITAL_COMMUNITY)
Admission: EM | Admit: 2019-09-23 | Discharge: 2019-09-23 | Disposition: A | Discharge status: Home / Self Care | Payer: Medicare Other | Source: Home / Self Care | Attending: Emergency Medicine | Admitting: Emergency Medicine

## 2019-09-23 DIAGNOSIS — A414 Sepsis due to anaerobes: Secondary | ICD-10-CM | POA: Diagnosis not present

## 2019-09-23 DIAGNOSIS — N186 End stage renal disease: Secondary | ICD-10-CM | POA: Insufficient documentation

## 2019-09-23 DIAGNOSIS — B182 Chronic viral hepatitis C: Secondary | ICD-10-CM | POA: Insufficient documentation

## 2019-09-23 DIAGNOSIS — I252 Old myocardial infarction: Secondary | ICD-10-CM | POA: Insufficient documentation

## 2019-09-23 DIAGNOSIS — E875 Hyperkalemia: Secondary | ICD-10-CM | POA: Diagnosis not present

## 2019-09-23 DIAGNOSIS — R188 Other ascites: Secondary | ICD-10-CM | POA: Insufficient documentation

## 2019-09-23 DIAGNOSIS — R0602 Shortness of breath: Secondary | ICD-10-CM

## 2019-09-23 DIAGNOSIS — Z79899 Other long term (current) drug therapy: Secondary | ICD-10-CM | POA: Insufficient documentation

## 2019-09-23 DIAGNOSIS — I120 Hypertensive chronic kidney disease with stage 5 chronic kidney disease or end stage renal disease: Secondary | ICD-10-CM | POA: Insufficient documentation

## 2019-09-23 DIAGNOSIS — J441 Chronic obstructive pulmonary disease with (acute) exacerbation: Secondary | ICD-10-CM

## 2019-09-23 DIAGNOSIS — I2510 Atherosclerotic heart disease of native coronary artery without angina pectoris: Secondary | ICD-10-CM | POA: Insufficient documentation

## 2019-09-23 DIAGNOSIS — F17210 Nicotine dependence, cigarettes, uncomplicated: Secondary | ICD-10-CM | POA: Insufficient documentation

## 2019-09-23 DIAGNOSIS — Z992 Dependence on renal dialysis: Secondary | ICD-10-CM | POA: Insufficient documentation

## 2019-09-23 LAB — COMPREHENSIVE METABOLIC PANEL
ALT: 23 U/L (ref 0–44)
AST: 48 U/L — ABNORMAL HIGH (ref 15–41)
Albumin: 3.4 g/dL — ABNORMAL LOW (ref 3.5–5.0)
Alkaline Phosphatase: 122 U/L (ref 38–126)
Anion gap: 13 (ref 5–15)
BUN: 49 mg/dL — ABNORMAL HIGH (ref 6–20)
CO2: 26 mmol/L (ref 22–32)
Calcium: 9.8 mg/dL (ref 8.9–10.3)
Chloride: 96 mmol/L — ABNORMAL LOW (ref 98–111)
Creatinine, Ser: 7.96 mg/dL — ABNORMAL HIGH (ref 0.61–1.24)
GFR calc Af Amer: 8 mL/min — ABNORMAL LOW (ref >60)
GFR calc non Af Amer: 7 mL/min — ABNORMAL LOW (ref >60)
Glucose, Bld: 110 mg/dL — ABNORMAL HIGH (ref 70–99)
Potassium: 6.2 mmol/L — ABNORMAL HIGH (ref 3.5–5.1)
Sodium: 135 mmol/L (ref 135–145)
Total Bilirubin: 0.4 mg/dL (ref 0.3–1.2)
Total Protein: 6.6 g/dL (ref 6.5–8.1)

## 2019-09-23 LAB — CBC WITH DIFFERENTIAL/PLATELET
Abs Immature Granulocytes: 0.01 10*3/uL (ref 0.00–0.07)
Basophils Absolute: 0 10*3/uL (ref 0.0–0.1)
Basophils Relative: 1 %
Eosinophils Absolute: 0.1 10*3/uL (ref 0.0–0.5)
Eosinophils Relative: 1 %
HCT: 27.9 % — ABNORMAL LOW (ref 39.0–52.0)
Hemoglobin: 9.6 g/dL — ABNORMAL LOW (ref 13.0–17.0)
Immature Granulocytes: 0 %
Lymphocytes Relative: 16 %
Lymphs Abs: 0.9 10*3/uL (ref 0.7–4.0)
MCH: 26.8 pg (ref 26.0–34.0)
MCHC: 34.4 g/dL (ref 30.0–36.0)
MCV: 77.9 fL — ABNORMAL LOW (ref 80.0–100.0)
Monocytes Absolute: 0.3 10*3/uL (ref 0.1–1.0)
Monocytes Relative: 6 %
Neutro Abs: 4.3 10*3/uL (ref 1.7–7.7)
Neutrophils Relative %: 76 %
Platelets: 145 10*3/uL — ABNORMAL LOW (ref 150–400)
RBC: 3.58 MIL/uL — ABNORMAL LOW (ref 4.22–5.81)
RDW: 14.6 % (ref 11.5–15.5)
WBC: 5.7 10*3/uL (ref 4.0–10.5)
nRBC: 0 % (ref 0.0–0.2)

## 2019-09-23 MED ORDER — HYDROMORPHONE HCL 1 MG/ML IJ SOLN
0.5000 mg | Freq: Once | INTRAMUSCULAR | Status: AC
  Administered 2019-09-23: 0.5 mg via INTRAVENOUS
  Filled 2019-09-23: qty 1

## 2019-09-23 MED ORDER — SODIUM ZIRCONIUM CYCLOSILICATE 10 G PO PACK
10.0000 g | PACK | Freq: Once | ORAL | Status: AC
  Administered 2019-09-23: 10 g via ORAL
  Filled 2019-09-23: qty 1

## 2019-09-23 MED ORDER — IPRATROPIUM BROMIDE HFA 17 MCG/ACT IN AERS
4.0000 | INHALATION_SPRAY | Freq: Once | RESPIRATORY_TRACT | Status: AC
  Administered 2019-09-23: 4 via RESPIRATORY_TRACT
  Filled 2019-09-23: qty 12.9

## 2019-09-23 MED ORDER — ALBUTEROL SULFATE HFA 108 (90 BASE) MCG/ACT IN AERS
8.0000 | INHALATION_SPRAY | RESPIRATORY_TRACT | Status: DC | PRN
  Administered 2019-09-23: 8 via RESPIRATORY_TRACT
  Filled 2019-09-23: qty 6.7

## 2019-09-23 NOTE — ED Notes (Signed)
Patient verbalizes understanding of discharge instructions and follow up care. Opportunity for questioning and answers were provided. All questions answered completely. Armband removed by staff, pt discharged from ED.Wheeled from ED with strong, steady gait

## 2019-09-23 NOTE — ED Triage Notes (Addendum)
Pt brought in by EMS with c/o SOB x2 weeks. Pt found to be at 93% on RA by EMS. Placed on 2L per pt request. Pt denies CP/fevers/N/V/D. Pt also c/o increased abd distention x1 week. Pt is on dialysis M/W/F

## 2019-09-23 NOTE — ED Notes (Signed)
Dr. Maryan Rued at bedside for paracentesis

## 2019-09-23 NOTE — ED Provider Notes (Signed)
[facility] EMERGENCY DEPARTMENT Provider Note   CSN: [id number] Arrival date & time: 09/23/19  2120     History Chief Complaint  Patient presents with  . Shortness of Breath    [name] is a 59 y.o. male.  Patient is a 59 year old male with significant medical history including COPD, CAD, end-stage renal disease on dialysis Monday Wednesday Friday, hepatitis C with cirrhosis requiring recurrent paracentesis, A. fib who is well-known to the emergency room presenting today with complaints of shortness of breath and abdominal pain.  Patient reports he went to dialysis 3 times last week Monday Wednesday and Friday and was below his dry weight but reports he continues to have worsening swelling in his abdomen which causes his entire abdomen to hurt and become very tight.  He denies new cough, sputum production, fever.  He last had paracentesis a week and a half ago where 2 L were removed.  Patient states the pain is a 9 out of 10 and diffuse over his abdomen.  It makes him short of breath.  However he is also been wheezing but has not used his inhaler at home.  The history is provided by the patient.  Shortness of Breath      Past Medical History:  Diagnosis Date  . Anemia   . Anxiety   . Arthritis    left shoulder  . Atherosclerosis of aorta ([place])   . Cardiomegaly   . Chest pain    DATE UNKNOWN, C/O PERIODICALLY  . Cocaine abuse ([place])   . COPD exacerbation ([place]) 08/17/2016  . Coronary artery disease    stent 02/22/17  . ESRD (end stage renal disease) on dialysis ([place])    "[name]; MWF" (07/04/2017)  . GERD (gastroesophageal reflux disease)    DATE UNKNOWN  . Hemorrhoids   . Hepatitis B, chronic ([place])   . Hepatitis C   . History of kidney stones   . Hyperkalemia   . Hypertension   . Kidney failure   . Metabolic bone disease    Patient denies  . Mitral stenosis   . Myocardial infarction ([place])   . Pneumonia   . Pulmonary edema   .  Solitary rectal ulcer syndrome 07/2017   at flex sig for rectal bleeding  . Tubular adenoma of colon     Patient Active Problem List   Diagnosis Date Noted  . Dyspnea 06/09/2019  . Acquired thrombophilia ([place]) 06/05/2019  . A-fib ([place]) 05/30/2019  . Atrial fibrillation with RVR ([place]) 05/29/2019  . Melena   . Pressure injury of skin 03/09/2019  . Abdominal distention   . Volume overload 12/28/2018  . Sepsis ([place]) 09/12/2018  . Atherosclerosis of native coronary artery of native heart without angina pectoris 03/11/2018  . Benign neoplasm of cecum   . Benign neoplasm of ascending colon   . Benign neoplasm of descending colon   . Benign neoplasm of rectum   . Paroxysmal atrial fibrillation ([place]) 01/23/2018  . Hx of colonic polyps 01/20/2018  . End-stage renal disease on hemodialysis ([place]) 11/21/2017  . GERD (gastroesophageal reflux disease) 11/16/2017  . Decompensated hepatic cirrhosis ([place]) 11/15/2017  . DNR (do not resuscitate)   . Palliative care by specialist   . Hyponatremia 11/04/2017  . SBP (spontaneous bacterial peritonitis) ([place]) 10/30/2017  . Liver disease, chronic 10/30/2017  . SOB (shortness of breath)   . Abdominal pain 10/28/2017  . Upper airway cough syndrome with flattening on f/v loop 10/13/17 c/w vcd 10/17/2017  .  Elevated diaphragm 10/13/2017  . Ileus ([place]) 09/29/2017  . QT prolongation 09/29/2017  . Malnutrition of moderate degree 09/29/2017  . Sinus congestion 09/03/2017  . Symptomatic anemia 09/02/2017  . Other cirrhosis of liver ([place]) 09/02/2017  . Left bundle branch block 09/02/2017  . Mitral stenosis 09/02/2017  . Hematochezia 07/15/2017  . Wide-complex tachycardia ([place])   . Endotracheally intubated   . ESRD on dialysis ([place]) 07/04/2017  . Acute respiratory failure with hypoxia ([place]) 06/18/2017  . CKD (chronic kidney disease) stage V requiring chronic dialysis ([place]) 06/18/2017  . History of Cocaine abuse ([place]) 06/18/2017  . Hypertension 06/18/2017    . Infection of AV graft for dialysis ([employer]) 06/18/2017  . Anxiety 06/18/2017  . Anemia due to chronic kidney disease 06/18/2017  . Atypical atrial flutter ([place]) 06/18/2017  . Personality disorder ([place]) 06/13/2017  . Cellulitis 06/12/2017  . Adjustment disorder with mixed anxiety and depressed mood 06/10/2017  . Suicidal ideation 06/10/2017  . Arm wound, left, sequela 06/10/2017  . Dyspnea on exertion 05/29/2017  . Tachycardia 05/29/2017  . Hyperkalemia 05/22/2017  . Acute metabolic encephalopathy   . Anemia 04/23/2017  . Ascites 04/23/2017  . COPD (chronic obstructive pulmonary disease) ([place]) 04/23/2017  . Acute on chronic respiratory failure with hypoxia ([place]) 03/25/2017  . Arrhythmia 03/25/2017  . COPD GOLD 0 with flattening on inps f/v  09/27/2016  . Essential hypertension 09/27/2016  . Fluid overload 08/30/2016  . COPD exacerbation ([place]) 08/17/2016  . Hypertensive urgency 08/17/2016  . Respiratory failure ([place]) 08/17/2016  . Problem with dialysis access ([place]) 07/23/2016  . Chronic hepatitis B ([place]) 03/05/2014  . Chronic hepatitis C without hepatic coma ([place]) 03/05/2014  . Internal hemorrhoids with bleeding, swelling and itching 03/05/2014  . Thrombocytopenia ([place]) 03/05/2014  . Chest pain 02/27/2014  . Alcohol abuse 04/14/2009  . Cigarette smoker 04/14/2009  . GANGLION CYST 04/14/2009    Past Surgical History:  Procedure Laterality Date  . A/V FISTULAGRAM Left 05/26/2017   Procedure: A/V FISTULAGRAM;  Surgeon: Conrad Short Pump, MD;  Location: [place] CV LAB;  Service: Cardiovascular;  Laterality: Left;  . A/V FISTULAGRAM Right 11/18/2017   Procedure: A/V FISTULAGRAM - Right Arm;  Surgeon: Elam Dutch, MD;  Location: [place] [facility];  Service: Cardiovascular;  Laterality: Right;  . APPLICATION OF WOUND VAC Left 06/14/2017   Procedure: APPLICATION OF WOUND VAC;  Surgeon: Katha Cabal, MD;  Location: [facility] ORS;  Service: Vascular;  Laterality: Left;  . AV  FISTULA PLACEMENT  2012   BELIEVED WAS PLACED IN JUNE  . AV FISTULA PLACEMENT Right 08/09/2017   Procedure: Creation Right arm ARTERIOVENOUS BRACHIOCEPOHALIC FISTULA;  Surgeon: Elam Dutch, MD;  Location: [facility] OR;  Service: Vascular;  Laterality: Right;  . AV FISTULA PLACEMENT Right 11/22/2017   Procedure: INSERTION OF ARTERIOVENOUS (AV) GORE-TEX GRAFT RIGHT UPPER ARM;  Surgeon: Elam Dutch, MD;  Location: [place];  Service: Vascular;  Laterality: Right;  . BIOPSY  01/25/2018   Procedure: BIOPSY;  Surgeon: Jerene Bears, MD;  Location: [place];  Service: Gastroenterology;;  . BIOPSY  04/10/2019   Procedure: BIOPSY;  Surgeon: Jerene Bears, MD;  Location: [facility];  Service: Gastroenterology;;  . COLONOSCOPY    . COLONOSCOPY WITH PROPOFOL N/A 01/25/2018   Procedure: COLONOSCOPY WITH PROPOFOL;  Surgeon: Jerene Bears, MD;  Location: [place];  Service: Gastroenterology;  Laterality: N/A;  . CORONARY STENT INTERVENTION N/A 02/22/2017   Procedure: CORONARY STENT INTERVENTION;  Surgeon: Virgina Jock,  Reynold Bowen, MD;  Location: [place] CV LAB;  Service: Cardiovascular;  Laterality: N/A;  . ESOPHAGOGASTRODUODENOSCOPY (EGD) WITH PROPOFOL N/A 01/25/2018   Procedure: ESOPHAGOGASTRODUODENOSCOPY (EGD) WITH PROPOFOL;  Surgeon: Jerene Bears, MD;  Location: [place];  Service: Gastroenterology;  Laterality: N/A;  . ESOPHAGOGASTRODUODENOSCOPY (EGD) WITH PROPOFOL N/A 04/10/2019   Procedure: ESOPHAGOGASTRODUODENOSCOPY (EGD) WITH PROPOFOL;  Surgeon: Jerene Bears, MD;  Location: [facility];  Service: Gastroenterology;  Laterality: N/A;  . FLEXIBLE SIGMOIDOSCOPY N/A 07/15/2017   Procedure: FLEXIBLE SIGMOIDOSCOPY;  Surgeon: Carol Ada, MD;  Location: [place];  Service: Endoscopy;  Laterality: N/A;  . HEMORRHOID BANDING    . I & D EXTREMITY Left 06/01/2017   Procedure: IRRIGATION AND DEBRIDEMENT LEFT ARM HEMATOMA WITH LIGATION OF LEFT ARM AV FISTULA;  Surgeon: Elam Dutch, MD;   Location: [place];  Service: Vascular;  Laterality: Left;  . I & D EXTREMITY Left 06/14/2017   Procedure: IRRIGATION AND DEBRIDEMENT EXTREMITY;  Surgeon: Katha Cabal, MD;  Location: [facility];  Service: Vascular;  Laterality: Left;  . INSERTION OF DIALYSIS CATHETER  05/30/2017  . INSERTION OF DIALYSIS CATHETER N/A 05/30/2017   Procedure: INSERTION OF DIALYSIS CATHETER;  Surgeon: Elam Dutch, MD;  Location: [place];  Service: Vascular;  Laterality: N/A;  . IR PARACENTESIS  08/30/2017  . IR PARACENTESIS  09/29/2017  . IR PARACENTESIS  10/28/2017  . IR PARACENTESIS  11/09/2017  . IR PARACENTESIS  11/16/2017  . IR PARACENTESIS  11/28/2017  . IR PARACENTESIS  12/01/2017  . IR PARACENTESIS  12/06/2017  . IR PARACENTESIS  01/03/2018  . IR PARACENTESIS  01/23/2018  . IR PARACENTESIS  02/07/2018  . IR PARACENTESIS  02/21/2018  . IR PARACENTESIS  03/06/2018  . IR PARACENTESIS  03/17/2018  . IR PARACENTESIS  04/04/2018  . IR PARACENTESIS  12/28/2018  . IR PARACENTESIS  01/08/2019  . IR PARACENTESIS  01/23/2019  . IR PARACENTESIS  02/01/2019  . IR PARACENTESIS  02/19/2019  . IR PARACENTESIS  03/01/2019  . IR PARACENTESIS  03/15/2019  . IR PARACENTESIS  04/03/2019  . IR PARACENTESIS  04/12/2019  . IR PARACENTESIS  05/01/2019  . IR PARACENTESIS  05/08/2019  . IR PARACENTESIS  05/24/2019  . IR PARACENTESIS  06/12/2019  . IR PARACENTESIS  07/09/2019  . IR PARACENTESIS  07/27/2019  . IR PARACENTESIS  08/09/2019  . IR PARACENTESIS  08/21/2019  . IR PARACENTESIS  09/17/2019  . IR RADIOLOGIST EVAL & MGMT  02/14/2018  . IR RADIOLOGIST EVAL & MGMT  02/22/2019  . LEFT HEART CATH AND CORONARY ANGIOGRAPHY N/A 02/22/2017   Procedure: LEFT HEART CATH AND CORONARY ANGIOGRAPHY;  Surgeon: Nigel Mormon, MD;  Location: [place] CV LAB;  Service: Cardiovascular;  Laterality: N/A;  . LIGATION OF ARTERIOVENOUS  FISTULA Left 12/14/7844   Procedure: Plication of Left Arm Arteriovenous Fistula;  Surgeon: Elam Dutch, MD;   Location: [place];  Service: Vascular;  Laterality: Left;  . POLYPECTOMY    . POLYPECTOMY  01/25/2018   Procedure: POLYPECTOMY;  Surgeon: Jerene Bears, MD;  Location: [place];  Service: Gastroenterology;;  . REVISON OF ARTERIOVENOUS FISTULA Left 9/62/9528   Procedure: PLICATION OF DISTAL ANEURYSMAL SEGEMENT OF LEFT UPPER ARM ARTERIOVENOUS FISTULA;  Surgeon: Elam Dutch, MD;  Location: [place];  Service: Vascular;  Laterality: Left;  . REVISON OF ARTERIOVENOUS FISTULA Left 10/23/2438   Procedure: Plication of Left Upper Arm Fistula ;  Surgeon: Waynetta Sandy, MD;  Location: [facility]  OR;  Service: Vascular;  Laterality: Left;  . SKIN GRAFT SPLIT THICKNESS LEG / FOOT Left    SKIN GRAFT SPLIT THICKNESS LEFT ARM DONOR SITE: LEFT ANTERIOR THIGH  . SKIN SPLIT GRAFT Left 07/04/2017   Procedure: SKIN GRAFT SPLIT THICKNESS LEFT ARM DONOR SITE: LEFT ANTERIOR THIGH;  Surgeon: Elam Dutch, MD;  Location: [place];  Service: Vascular;  Laterality: Left;  . THROMBECTOMY W/ EMBOLECTOMY Left 06/05/2017   Procedure: EXPLORATION OF LEFT ARM FOR BLEEDING; OVERSEWED PROXIMAL FISTULA;  Surgeon: Angelia Mould, MD;  Location: [place];  Service: Vascular;  Laterality: Left;  . WOUND EXPLORATION Left 06/03/2017   Procedure: WOUND EXPLORATION WITH WOUND VAC APPLICATION TO LEFT ARM;  Surgeon: Angelia Mould, MD;  Location: [facility] OR;  Service: Vascular;  Laterality: Left;       Family History  Problem Relation Age of Onset  . Heart disease Mother   . Lung cancer Mother   . Heart disease Father   . Malignant hyperthermia Father   . COPD Father   . Throat cancer Sister   . Esophageal cancer Sister   . Hypertension Other   . COPD Other   . Colon cancer Neg Hx   . Colon polyps Neg Hx   . Rectal cancer Neg Hx   . Stomach cancer Neg Hx     Social History   Tobacco Use  . Smoking status: Current Every Day Smoker    Packs/day: 0.50    Years: 43.00    Pack years: 21.50    Types:  Cigarettes    Start date: 08/13/1973  . Smokeless tobacco: Never Used  . Tobacco comment: i dont know i just make   Substance Use Topics  . Alcohol use: Not Currently    Comment: quit drinking in 2017  . Drug use: Not Currently    Types: Marijuana, Cocaine    Comment: reports using once every 3 months, 04-06-2019 was this     Home Medications Prior to Admission medications   Medication Sig Start Date End Date Taking? Authorizing Provider  albuterol (VENTOLIN HFA) 108 (90 Base) MCG/ACT inhaler Inhale 2 puffs into the lungs every 6 (six) hours as needed for wheezing or shortness of breath.    [provider]  hydrALAZINE (APRESOLINE) 100 MG tablet Take 100 mg by mouth 2 (two) times daily.    [provider]  hydrOXYzine (ATARAX/VISTARIL) 25 MG tablet Take 25 mg by mouth 2 (two) times daily as needed. 08/21/19   [provider]  loperamide (IMODIUM A-D) 2 MG tablet Take 2 mg by mouth 4 (four) times daily as needed for diarrhea or loose stools.    [provider]  metoprolol tartrate (LOPRESSOR) 100 MG tablet Take 1 tablet (100 mg total) by mouth 2 (two) times daily. 06/04/19   Al Decant, MD  montelukast (SINGULAIR) 10 MG tablet Take 10 mg by mouth every evening.     [provider]  ondansetron (ZOFRAN) 8 MG tablet Take 8 mg by mouth every 4 (four) hours as needed for nausea.  06/14/19   [provider]  Oxycodone HCl 10 MG TABS Take 10 mg by mouth 3 (three) times daily as needed. 08/25/19   [provider]  pantoprazole (PROTONIX) 40 MG tablet Take 40 mg by mouth daily.    [provider]  sevelamer carbonate (RENVELA) 800 MG tablet TAKE 4 TABS WITH MEALS AND 2 TABS TWICE A DAY WITH SNACKS    [provider]  sulfamethoxazole-trimethoprim (BACTRIM DS) 800-160 MG tablet Take 1 tablet by mouth daily. 12/30/18   Geradine Girt, DO  SYMBICORT 160-4.5 MCG/ACT inhaler Inhale 2 puffs into the lungs 2 (two) times daily.   11/30/18   [provider]  zolpidem (AMBIEN) 10 MG tablet Take 10 mg by mouth at bedtime as needed for sleep.    [provider]    Allergies    Morphine and related, Aspirin, Clonidine derivatives, Tramadol, and Tylenol [acetaminophen]  Review of Systems   Review of Systems  Respiratory: Positive for shortness of breath.   All other systems reviewed and are negative.   Physical Exam Updated Vital Signs BP (!) 163/102   Pulse (!) 109   Temp 99.1 F (37.3 C) (Oral)   Resp (!) 26   Ht 5\' 9"  (1.753 m)   Wt 63.5 kg   SpO2 99%   BMI 20.67 kg/m   Physical Exam Vitals and nursing note reviewed.  Constitutional:      General: He is not in acute distress.    Appearance: He is well-developed and underweight.  HENT:     Head: Normocephalic and atraumatic.  Eyes:     Conjunctiva/sclera: Conjunctivae normal.     Pupils: Pupils are equal, round, and reactive to light.  Cardiovascular:     Rate and Rhythm: Tachycardia present. Rhythm irregularly irregular.     Pulses: Normal pulses.     Heart sounds: No murmur.  Pulmonary:     Effort: Pulmonary effort is normal. Tachypnea present. No respiratory distress.     Breath sounds: Wheezing present. No rales.  Abdominal:     General: There is distension.     Palpations: Abdomen is soft.     Tenderness: There is abdominal tenderness. There is no guarding or rebound.     Comments: Tense ascites present throughout the abdomen  Musculoskeletal:        General: No tenderness. Normal range of motion.     Cervical back: Normal range of motion and neck supple.     Comments: Graft present in the upper extremity with palpable thrill.  No notable edema in the lower extremities.  Skin:    General: Skin is warm and dry.     Findings: No erythema or rash.  Neurological:     Mental Status: He is alert and oriented to person, place, and time.  Psychiatric:        Behavior: Behavior normal.     ED Results / Procedures /  Treatments   Labs (all labs ordered are listed, but only abnormal results are displayed) Labs Reviewed  CBC WITH DIFFERENTIAL/PLATELET - Abnormal; Notable for the following components:      Result Value   RBC 3.58 (*)    Hemoglobin 9.6 (*)    HCT 27.9 (*)    MCV 77.9 (*)    Platelets 145 (*)    All other components within normal limits  COMPREHENSIVE METABOLIC PANEL - Abnormal; Notable for the following components:   Potassium 6.2 (*)    Chloride 96 (*)    Glucose, Bld 110 (*)    BUN 49 (*)    Creatinine, Ser 7.96 (*)    Albumin 3.4 (*)    AST 48 (*)    GFR calc non Af Amer 7 (*)    GFR calc Af Amer 8 (*)    All other components within normal limits    EKG EKG Interpretation  Date/Time:  Sunday September 23 2019 21:31:37 EDT  Ventricular Rate:  111 PR Interval:    QRS Duration: 180 QT Interval:  452 QTC Calculation: 615 R Axis:   -112 Text Interpretation: Sinus tachycardia Prolonged PR interval Biatrial enlargement Nonspecific IVCD with LAD Consider left ventricular hypertrophy Anterior Q waves, possibly due to LVH Abnrm T, consider ischemia, anterolateral lds peaked t-waves new since last EKG Confirmed by Blanchie Dessert [phone]) on 09/23/2019 9:36:48 PM   Radiology DG Chest Port 1 View  Result Date: 09/23/2019 CLINICAL DATA:  Shortness of breath and abdominal distention. EXAM: PORTABLE CHEST 1 VIEW COMPARISON:  August 10, 2019 FINDINGS: Mild chronic appearing increased interstitial lung markings are again seen. Very mild hazy infiltrates are also seen along the lateral aspect of the mid lung fields, right greater than left. This represents a new finding when compared to the prior study. There is no evidence of a pleural effusion or pneumothorax. There is marked severity cardiomegaly which is unchanged in size. Marked severity calcification of the aortic arch is seen. The visualized skeletal structures are unremarkable. IMPRESSION: 1. Chronic appearing increased interstitial  lung markings with mild hazy infiltrates seen along the lateral aspects of the mid lung fields, right greater than left. Electronically Signed   By: Virgina Norfolk M.D.   On: 09/23/2019 22:04    Procedures .Paracentesis  Date/Time: 09/23/2019 11:13 PM Performed by: Blanchie Dessert, MD Authorized by: Blanchie Dessert, MD   Consent:    Consent obtained:  Verbal   Consent given by:  Patient   Risks discussed:  Bleeding, bowel perforation, infection and pain   Alternatives discussed:  No treatment Pre-procedure details:    Procedure purpose:  Therapeutic   Preparation: Patient was prepped and draped in usual sterile fashion   Anesthesia (see MAR for exact dosages):    Anesthesia method:  Local infiltration   Local anesthetic:  Lidocaine 2% w/o epi Procedure details:    Needle gauge:  20   Ultrasound guidance: yes     Puncture site:  L lower quadrant   Fluid removed amount:  4259mL   Fluid appearance:  Amber   Dressing:  4x4 sterile gauze and adhesive bandage Post-procedure details:    Patient tolerance of procedure:  Tolerated well, no immediate complications   (including critical care time)  Medications Ordered in ED Medications  HYDROmorphone (DILAUDID) injection 0.5 mg (has no administration in time range)  albuterol (VENTOLIN HFA) 108 (90 Base) MCG/ACT inhaler 8 puff (has no administration in time range)  ipratropium (ATROVENT HFA) inhaler 4 puff (has no administration in time range)    ED Course  I have reviewed the triage vital signs and the nursing notes.  Pertinent labs & imaging results that were available during my care of the patient were reviewed by me and considered in my medical decision making (see chart for details).    MDM Rules/Calculators/A&P                      59 year old male with multiple medical problems presenting today with complaint of shortness of breath and abdominal pain.  Patient reports that he did go to all of his dialysis treatments  last week and is due for dialysis in the morning.  He denies any infectious symptoms.  On exam he has increased work of breathing with wheezing bilaterally.  Patient is a ongoing smoker and has a history of COPD.  Wheezing could be related to fluid overload however could also be related to COPD.  Also shortness of breath could  be related to abdominal ascites.  Patient denies any fever and states that his abdomen feels this way when it is time for paracentesis.  Last paracentesis was approximately 10 days ago per patient with interventional radiology where he had 2 L removed.  Despite doing dialysis last week it did not prevent the fluid for reaccumulating.  Lower suspicion for SBP at this time.  Patient does have peaked T waves today on EKG and will ensure no hyperkalemia.  Patient was given albuterol and Atrovent.  He was given pain control and bedside ultrasound did show a drainable pocket of fluid and will perform paracentesis at bedside.  11:17 PM 4200 mL of fluid removed.  Patient is feeling much better.  Labs are consistent with hypokalemia of 6.2 with some peaked T waves on EKG.  No evidence of dysrhythmia at this time.  Patient's tachycardia has improved after fluid removal and inhalers.  Work of breathing has also improved.  Patient is wishing to go home.  Will run by nephrology prior to discharge.  11:21 PM Discussed patient with Dr. Burnett Sheng who evaluated the EKG and looked at the lab work.  He feels that it is reasonable since patient is feeling better and vital signs have improved that he can go home and dialyze in the morning as planned.  He was given 1 dose of Lokelma 10 g.  Final Clinical Impression(s) / ED Diagnoses Final diagnoses:  Ascites of liver  COPD exacerbation (HCC)  Shortness of breath  Hyperkalemia    Rx / DC Orders ED Discharge Orders    None       Blanchie Dessert, MD 09/23/19 2321

## 2019-09-23 NOTE — Discharge Instructions (Signed)
Follow-up for dialysis in the morning as planned.

## 2019-09-23 NOTE — ED Notes (Signed)
All meds given per [facility]. Labs labeled and sent

## 2019-09-23 NOTE — ED Notes (Signed)
ED Provider at bedside. 

## 2019-09-23 NOTE — ED Notes (Signed)
Pt breathing non-labored on 2L [place] and satting at 99%. Pt demanding to have oxygen increased. Pt educated that he does not require more oxygen at this time as it can make him sick. Pt refuses education and continues to turn his oxygen up himself. Dr. Maryan Rued aware

## 2019-09-24 ENCOUNTER — Inpatient Hospital Stay (HOSPITAL_COMMUNITY): Admission: RE | Admit: 2019-09-24 | Payer: Medicare Other | Source: Ambulatory Visit

## 2019-09-25 ENCOUNTER — Inpatient Hospital Stay (HOSPITAL_COMMUNITY)
Admission: EM | Admit: 2019-09-25 | Discharge: 2019-10-01 | DRG: 871 | Discharge status: Against Medical Advice | Payer: Medicare Other | Attending: Internal Medicine | Admitting: Internal Medicine

## 2019-09-25 ENCOUNTER — Emergency Department (HOSPITAL_COMMUNITY): Payer: Medicare Other

## 2019-09-25 ENCOUNTER — Encounter (HOSPITAL_COMMUNITY): Payer: Self-pay | Admitting: Emergency Medicine

## 2019-09-25 ENCOUNTER — Other Ambulatory Visit: Payer: Self-pay

## 2019-09-25 DIAGNOSIS — Z808 Family history of malignant neoplasm of other organs or systems: Secondary | ICD-10-CM

## 2019-09-25 DIAGNOSIS — B181 Chronic viral hepatitis B without delta-agent: Secondary | ICD-10-CM | POA: Diagnosis present

## 2019-09-25 DIAGNOSIS — E162 Hypoglycemia, unspecified: Secondary | ICD-10-CM | POA: Diagnosis present

## 2019-09-25 DIAGNOSIS — R188 Other ascites: Secondary | ICD-10-CM | POA: Diagnosis present

## 2019-09-25 DIAGNOSIS — G8929 Other chronic pain: Secondary | ICD-10-CM | POA: Diagnosis present

## 2019-09-25 DIAGNOSIS — Z765 Malingerer [conscious simulation]: Secondary | ICD-10-CM

## 2019-09-25 DIAGNOSIS — I342 Nonrheumatic mitral (valve) stenosis: Secondary | ICD-10-CM | POA: Diagnosis present

## 2019-09-25 DIAGNOSIS — Z789 Other specified health status: Secondary | ICD-10-CM

## 2019-09-25 DIAGNOSIS — R0602 Shortness of breath: Secondary | ICD-10-CM

## 2019-09-25 DIAGNOSIS — N19 Unspecified kidney failure: Secondary | ICD-10-CM | POA: Diagnosis present

## 2019-09-25 DIAGNOSIS — I132 Hypertensive heart and chronic kidney disease with heart failure and with stage 5 chronic kidney disease, or end stage renal disease: Secondary | ICD-10-CM | POA: Diagnosis present

## 2019-09-25 DIAGNOSIS — Z885 Allergy status to narcotic agent status: Secondary | ICD-10-CM

## 2019-09-25 DIAGNOSIS — I2729 Other secondary pulmonary hypertension: Secondary | ICD-10-CM | POA: Diagnosis present

## 2019-09-25 DIAGNOSIS — D638 Anemia in other chronic diseases classified elsewhere: Secondary | ICD-10-CM | POA: Diagnosis present

## 2019-09-25 DIAGNOSIS — B1920 Unspecified viral hepatitis C without hepatic coma: Secondary | ICD-10-CM | POA: Diagnosis present

## 2019-09-25 DIAGNOSIS — Z9114 Patient's other noncompliance with medication regimen: Secondary | ICD-10-CM

## 2019-09-25 DIAGNOSIS — I50812 Chronic right heart failure: Secondary | ICD-10-CM | POA: Diagnosis present

## 2019-09-25 DIAGNOSIS — Z66 Do not resuscitate: Secondary | ICD-10-CM | POA: Diagnosis present

## 2019-09-25 DIAGNOSIS — Z8249 Family history of ischemic heart disease and other diseases of the circulatory system: Secondary | ICD-10-CM

## 2019-09-25 DIAGNOSIS — Z9115 Patient's noncompliance with renal dialysis: Secondary | ICD-10-CM

## 2019-09-25 DIAGNOSIS — Z888 Allergy status to other drugs, medicaments and biological substances status: Secondary | ICD-10-CM

## 2019-09-25 DIAGNOSIS — E875 Hyperkalemia: Secondary | ICD-10-CM | POA: Diagnosis present

## 2019-09-25 DIAGNOSIS — D6869 Other thrombophilia: Secondary | ICD-10-CM | POA: Diagnosis present

## 2019-09-25 DIAGNOSIS — Z978 Presence of other specified devices: Secondary | ICD-10-CM

## 2019-09-25 DIAGNOSIS — J810 Acute pulmonary edema: Secondary | ICD-10-CM | POA: Diagnosis present

## 2019-09-25 DIAGNOSIS — J9600 Acute respiratory failure, unspecified whether with hypoxia or hypercapnia: Secondary | ICD-10-CM

## 2019-09-25 DIAGNOSIS — Z01818 Encounter for other preprocedural examination: Secondary | ICD-10-CM

## 2019-09-25 DIAGNOSIS — A414 Sepsis due to anaerobes: Principal | ICD-10-CM | POA: Diagnosis present

## 2019-09-25 DIAGNOSIS — U071 COVID-19: Secondary | ICD-10-CM | POA: Diagnosis present

## 2019-09-25 DIAGNOSIS — F609 Personality disorder, unspecified: Secondary | ICD-10-CM | POA: Diagnosis present

## 2019-09-25 DIAGNOSIS — Z955 Presence of coronary angioplasty implant and graft: Secondary | ICD-10-CM

## 2019-09-25 DIAGNOSIS — Z515 Encounter for palliative care: Secondary | ICD-10-CM | POA: Diagnosis present

## 2019-09-25 DIAGNOSIS — F419 Anxiety disorder, unspecified: Secondary | ICD-10-CM | POA: Diagnosis present

## 2019-09-25 DIAGNOSIS — R109 Unspecified abdominal pain: Secondary | ICD-10-CM

## 2019-09-25 DIAGNOSIS — Z801 Family history of malignant neoplasm of trachea, bronchus and lung: Secondary | ICD-10-CM

## 2019-09-25 DIAGNOSIS — Z87442 Personal history of urinary calculi: Secondary | ICD-10-CM

## 2019-09-25 DIAGNOSIS — I2510 Atherosclerotic heart disease of native coronary artery without angina pectoris: Secondary | ICD-10-CM | POA: Diagnosis present

## 2019-09-25 DIAGNOSIS — I252 Old myocardial infarction: Secondary | ICD-10-CM

## 2019-09-25 DIAGNOSIS — I2781 Cor pulmonale (chronic): Secondary | ICD-10-CM | POA: Diagnosis present

## 2019-09-25 DIAGNOSIS — I509 Heart failure, unspecified: Secondary | ICD-10-CM | POA: Diagnosis present

## 2019-09-25 DIAGNOSIS — K7460 Unspecified cirrhosis of liver: Secondary | ICD-10-CM | POA: Diagnosis present

## 2019-09-25 DIAGNOSIS — N186 End stage renal disease: Secondary | ICD-10-CM | POA: Diagnosis present

## 2019-09-25 DIAGNOSIS — F17210 Nicotine dependence, cigarettes, uncomplicated: Secondary | ICD-10-CM | POA: Diagnosis present

## 2019-09-25 DIAGNOSIS — R6521 Severe sepsis with septic shock: Secondary | ICD-10-CM | POA: Diagnosis not present

## 2019-09-25 DIAGNOSIS — Z7189 Other specified counseling: Secondary | ICD-10-CM

## 2019-09-25 DIAGNOSIS — J440 Chronic obstructive pulmonary disease with acute lower respiratory infection: Secondary | ICD-10-CM | POA: Diagnosis present

## 2019-09-25 DIAGNOSIS — A0472 Enterocolitis due to Clostridium difficile, not specified as recurrent: Secondary | ICD-10-CM | POA: Diagnosis present

## 2019-09-25 DIAGNOSIS — G9341 Metabolic encephalopathy: Secondary | ICD-10-CM | POA: Diagnosis present

## 2019-09-25 DIAGNOSIS — F4323 Adjustment disorder with mixed anxiety and depressed mood: Secondary | ICD-10-CM | POA: Diagnosis present

## 2019-09-25 DIAGNOSIS — F1410 Cocaine abuse, uncomplicated: Secondary | ICD-10-CM | POA: Diagnosis present

## 2019-09-25 DIAGNOSIS — Z7951 Long term (current) use of inhaled steroids: Secondary | ICD-10-CM

## 2019-09-25 DIAGNOSIS — I447 Left bundle-branch block, unspecified: Secondary | ICD-10-CM | POA: Diagnosis present

## 2019-09-25 DIAGNOSIS — I480 Paroxysmal atrial fibrillation: Secondary | ICD-10-CM | POA: Diagnosis present

## 2019-09-25 DIAGNOSIS — F1210 Cannabis abuse, uncomplicated: Secondary | ICD-10-CM | POA: Diagnosis present

## 2019-09-25 DIAGNOSIS — D631 Anemia in chronic kidney disease: Secondary | ICD-10-CM | POA: Diagnosis present

## 2019-09-25 DIAGNOSIS — Z825 Family history of asthma and other chronic lower respiratory diseases: Secondary | ICD-10-CM

## 2019-09-25 DIAGNOSIS — J1282 Pneumonia due to coronavirus disease 2019: Secondary | ICD-10-CM | POA: Diagnosis present

## 2019-09-25 DIAGNOSIS — J9601 Acute respiratory failure with hypoxia: Secondary | ICD-10-CM

## 2019-09-25 DIAGNOSIS — Z79899 Other long term (current) drug therapy: Secondary | ICD-10-CM

## 2019-09-25 DIAGNOSIS — I700 Atherosclerosis of aorta: Secondary | ICD-10-CM | POA: Diagnosis present

## 2019-09-25 DIAGNOSIS — K219 Gastro-esophageal reflux disease without esophagitis: Secondary | ICD-10-CM | POA: Diagnosis present

## 2019-09-25 DIAGNOSIS — Z886 Allergy status to analgesic agent status: Secondary | ICD-10-CM

## 2019-09-25 DIAGNOSIS — Z992 Dependence on renal dialysis: Secondary | ICD-10-CM

## 2019-09-25 LAB — CBC WITH DIFFERENTIAL/PLATELET
Abs Immature Granulocytes: 0.05 10*3/uL (ref 0.00–0.07)
Basophils Absolute: 0.1 10*3/uL (ref 0.0–0.1)
Basophils Relative: 1 %
Eosinophils Absolute: 0 10*3/uL (ref 0.0–0.5)
Eosinophils Relative: 0 %
HCT: 31 % — ABNORMAL LOW (ref 39.0–52.0)
Hemoglobin: 10.5 g/dL — ABNORMAL LOW (ref 13.0–17.0)
Immature Granulocytes: 1 %
Lymphocytes Relative: 15 %
Lymphs Abs: 0.9 10*3/uL (ref 0.7–4.0)
MCH: 26.4 pg (ref 26.0–34.0)
MCHC: 33.9 g/dL (ref 30.0–36.0)
MCV: 78.1 fL — ABNORMAL LOW (ref 80.0–100.0)
Monocytes Absolute: 0.3 10*3/uL (ref 0.1–1.0)
Monocytes Relative: 6 %
Neutro Abs: 4.7 10*3/uL (ref 1.7–7.7)
Neutrophils Relative %: 77 %
Platelets: 126 10*3/uL — ABNORMAL LOW (ref 150–400)
RBC: 3.97 MIL/uL — ABNORMAL LOW (ref 4.22–5.81)
RDW: 15 % (ref 11.5–15.5)
WBC: 6.1 10*3/uL (ref 4.0–10.5)
nRBC: 0 % (ref 0.0–0.2)

## 2019-09-25 LAB — COMPREHENSIVE METABOLIC PANEL
ALT: 19 U/L (ref 0–44)
AST: 41 U/L (ref 15–41)
Albumin: 3.2 g/dL — ABNORMAL LOW (ref 3.5–5.0)
Alkaline Phosphatase: 131 U/L — ABNORMAL HIGH (ref 38–126)
Anion gap: 19 — ABNORMAL HIGH (ref 5–15)
BUN: 72 mg/dL — ABNORMAL HIGH (ref 6–20)
CO2: 21 mmol/L — ABNORMAL LOW (ref 22–32)
Calcium: 9.7 mg/dL (ref 8.9–10.3)
Chloride: 92 mmol/L — ABNORMAL LOW (ref 98–111)
Creatinine, Ser: 10.76 mg/dL — ABNORMAL HIGH (ref 0.61–1.24)
GFR calc Af Amer: 5 mL/min — ABNORMAL LOW (ref >60)
GFR calc non Af Amer: 5 mL/min — ABNORMAL LOW (ref >60)
Glucose, Bld: 73 mg/dL (ref 70–99)
Potassium: >7.5 mmol/L (ref 3.5–5.1)
Sodium: 132 mmol/L — ABNORMAL LOW (ref 135–145)
Total Bilirubin: 0.8 mg/dL (ref 0.3–1.2)
Total Protein: 6.4 g/dL — ABNORMAL LOW (ref 6.5–8.1)

## 2019-09-25 LAB — PROTIME-INR
INR: 1.1 (ref 0.8–1.2)
Prothrombin Time: 14 seconds (ref 11.4–15.2)

## 2019-09-25 LAB — CBG MONITORING, ED
Glucose-Capillary: 100 mg/dL — ABNORMAL HIGH (ref 70–99)
Glucose-Capillary: 142 mg/dL — ABNORMAL HIGH (ref 70–99)
Glucose-Capillary: 17 mg/dL — CL (ref 70–99)
Glucose-Capillary: 202 mg/dL — ABNORMAL HIGH (ref 70–99)

## 2019-09-25 LAB — I-STAT CHEM 8, ED
BUN: 63 mg/dL — ABNORMAL HIGH (ref 6–20)
Calcium, Ion: 1.08 mmol/L — ABNORMAL LOW (ref 1.15–1.40)
Chloride: 98 mmol/L (ref 98–111)
Creatinine, Ser: 10.9 mg/dL — ABNORMAL HIGH (ref 0.61–1.24)
Glucose, Bld: 65 mg/dL — ABNORMAL LOW (ref 70–99)
HCT: 31 % — ABNORMAL LOW (ref 39.0–52.0)
Hemoglobin: 10.5 g/dL — ABNORMAL LOW (ref 13.0–17.0)
Potassium: 7.8 mmol/L (ref 3.5–5.1)
Sodium: 129 mmol/L — ABNORMAL LOW (ref 135–145)
TCO2: 26 mmol/L (ref 22–32)

## 2019-09-25 MED ORDER — SODIUM BICARBONATE 8.4 % IV SOLN
50.0000 meq | Freq: Once | INTRAVENOUS | Status: AC
  Administered 2019-09-25: 50 meq via INTRAVENOUS
  Filled 2019-09-25: qty 50

## 2019-09-25 MED ORDER — CALCIUM GLUCONATE-NACL 1-0.675 GM/50ML-% IV SOLN
1.0000 g | Freq: Once | INTRAVENOUS | Status: AC
  Administered 2019-09-25: 1000 mg via INTRAVENOUS
  Filled 2019-09-25: qty 50

## 2019-09-25 MED ORDER — CALCIUM GLUCONATE-NACL 1-0.675 GM/50ML-% IV SOLN: 1.0000 g | Freq: Once | INTRAVENOUS | Status: AC

## 2019-09-25 MED ORDER — DEXTROSE 50 % IV SOLN
1.0000 | Freq: Once | INTRAVENOUS | Status: AC
  Administered 2019-09-25: 50 mL via INTRAVENOUS

## 2019-09-25 MED ORDER — DEXTROSE 50 % IV SOLN
INTRAVENOUS | Status: AC
  Administered 2019-09-25: 50 mL via INTRAVENOUS
  Filled 2019-09-25: qty 50

## 2019-09-25 MED ORDER — DEXTROSE 50 % IV SOLN
1.0000 | Freq: Once | INTRAVENOUS | Status: AC
  Filled 2019-09-25: qty 50

## 2019-09-25 MED ORDER — DILTIAZEM HCL 25 MG/5ML IV SOLN
20.0000 mg | Freq: Once | INTRAVENOUS | Status: AC
  Administered 2019-09-25: 20 mg via INTRAVENOUS
  Filled 2019-09-25: qty 5

## 2019-09-25 MED ORDER — INSULIN ASPART 100 UNIT/ML ~~LOC~~ SOLN: 6.0000 [IU] | Freq: Once | SUBCUTANEOUS | Status: DC

## 2019-09-25 MED ORDER — PROPOFOL 1000 MG/100ML IV EMUL
INTRAVENOUS | Status: AC
  Administered 2019-09-25: 30 ug/kg/min via INTRAVENOUS
  Filled 2019-09-25: qty 100

## 2019-09-25 MED ORDER — CALCIUM GLUCONATE-NACL 1-0.675 GM/50ML-% IV SOLN
INTRAVENOUS | Status: AC
  Administered 2019-09-25: 1000 mg via INTRAVENOUS
  Filled 2019-09-25: qty 50

## 2019-09-25 MED ORDER — DEXTROSE 50 % IV SOLN
1.0000 | Freq: Once | INTRAVENOUS | Status: AC
  Administered 2019-09-25: 50 mL via INTRAVENOUS
  Filled 2019-09-25: qty 50

## 2019-09-25 MED ORDER — PROPOFOL 1000 MG/100ML IV EMUL
5.0000 ug/kg/min | INTRAVENOUS | Status: AC
  Administered 2019-09-26: 35 ug/kg/min via INTRAVENOUS
  Administered 2019-09-26 – 2019-09-27 (×3): 30 ug/kg/min via INTRAVENOUS
  Filled 2019-09-25 (×4): qty 100

## 2019-09-25 MED ORDER — CHLORHEXIDINE GLUCONATE CLOTH 2 % EX PADS
6.0000 | MEDICATED_PAD | Freq: Every day | CUTANEOUS | Status: DC
  Administered 2019-09-26 – 2019-09-28 (×3): 6 via TOPICAL

## 2019-09-25 MED ORDER — INSULIN ASPART 100 UNIT/ML ~~LOC~~ SOLN
10.0000 [IU] | Freq: Once | SUBCUTANEOUS | Status: AC
  Administered 2019-09-25: 10 [IU] via INTRAVENOUS

## 2019-09-25 MED ORDER — SODIUM CHLORIDE 0.9 % IV SOLN: 1.0000 g | Freq: Once | INTRAVENOUS | Status: DC

## 2019-09-25 MED ORDER — PROPOFOL 1000 MG/100ML IV EMUL: 5.0000 ug/kg/min | INTRAVENOUS | Status: DC

## 2019-09-25 MED ORDER — SODIUM ZIRCONIUM CYCLOSILICATE 10 G PO PACK
10.0000 g | PACK | Freq: Once | ORAL | Status: AC
  Administered 2019-09-25: 10 g via ORAL
  Filled 2019-09-25: qty 1

## 2019-09-25 NOTE — ED Provider Notes (Signed)
[facility] EMERGENCY DEPARTMENT Provider Note   CSN: [id number] Arrival date & time: 09/25/19  1956     History Chief Complaint  Patient presents with  . Shortness of Breath  . Vascular Access Problem    [name] is a 59 y.o. male.  HPI Patient presents to the emergency department with weakness that is been progressive for the last week.  Patient states he is not been to dialysis in over a week.  Patient was here 2 days ago for ascites.  The patient states that he has not been getting out of bed and eating and drinking.  The patient states that he did not take any of his medications over the last few days.  The patient denies chest pain, shortness of breath, headache,blurred vision, neck pain, fever, cough, numbness, dizziness, anorexia, edema, abdominal pain, nausea, vomiting, diarrhea, rash, back pain, dysuria, hematemesis, bloody stool, near syncope, or syncope.    Past Medical History:  Diagnosis Date  . Anemia   . Anxiety   . Arthritis    left shoulder  . Atherosclerosis of aorta ([name])   . Cardiomegaly   . Chest pain    DATE UNKNOWN, C/O PERIODICALLY  . Cocaine abuse ([place])   . COPD exacerbation ([place]) 08/17/2016  . Coronary artery disease    stent 02/22/17  . ESRD (end stage renal disease) on dialysis ([place])    "[name]; MWF" (07/04/2017)  . GERD (gastroesophageal reflux disease)    DATE UNKNOWN  . Hemorrhoids   . Hepatitis B, chronic ([place])   . Hepatitis C   . History of kidney stones   . Hyperkalemia   . Hypertension   . Kidney failure   . Metabolic bone disease    Patient denies  . Mitral stenosis   . Myocardial infarction ([place])   . Pneumonia   . Pulmonary edema   . Solitary rectal ulcer syndrome 07/2017   at [facility] for rectal bleeding  . Tubular adenoma of colon     Patient Active Problem List   Diagnosis Date Noted  . Dyspnea 06/09/2019  . Acquired thrombophilia ([place]) 06/05/2019  . A-fib ([place]) 05/30/2019  .  Atrial fibrillation with RVR ([place]) 05/29/2019  . Melena   . Pressure injury of skin 03/09/2019  . Abdominal distention   . Volume overload 12/28/2018  . Sepsis ([place]) 09/12/2018  . Atherosclerosis of native coronary artery of native heart without angina pectoris 03/11/2018  . Benign neoplasm of cecum   . Benign neoplasm of ascending colon   . Benign neoplasm of descending colon   . Benign neoplasm of rectum   . Paroxysmal atrial fibrillation ([place]) 01/23/2018  . Hx of colonic polyps 01/20/2018  . End-stage renal disease on hemodialysis ([place]) 11/21/2017  . GERD (gastroesophageal reflux disease) 11/16/2017  . Decompensated hepatic cirrhosis ([place]inf[place]) 11/15/2017  . DNR (do not resuscitate)   . Palliative care by specialist   . Hyponatremia 11/04/2017  . SBP (spontaneous bacterial peritonitis) ([place]) 10/30/2017  . Liver disease, chronic 10/30/2017  . SOB (shortness of breath)   . Abdominal pain 10/28/2017  . Upper airway cough syndrome with flattening on f/v loop 10/13/17 c/w vcd 10/17/2017  . Elevated diaphragm 10/13/2017  . Ileus ([place]) 09/29/2017  . QT prolongation 09/29/2017  . Malnutrition of moderate degree 09/29/2017  . Sinus congestion 09/03/2017  . Symptomatic anemia 09/02/2017  . Other cirrhosis of liver ([place]) 09/02/2017  . Left bundle branch block 09/02/2017  . Mitral stenosis 09/02/2017  .  Hematochezia 07/15/2017  . Wide-complex tachycardia ([place])   . Endotracheally intubated   . ESRD on dialysis ([place]) 07/04/2017  . Acute respiratory failure with hypoxia ([place]) 06/18/2017  . CKD (chronic kidney disease) stage V requiring chronic dialysis ([place]) 06/18/2017  . History of Cocaine abuse ([place]) 06/18/2017  . Hypertension 06/18/2017  . Infection of AV graft for dialysis ([place]) 06/18/2017  . Anxiety 06/18/2017  . Anemia due to chronic kidney disease 06/18/2017  . Atypical atrial flutter ([place]) 06/18/2017  . Personality disorder ([place]) 06/13/2017  . Cellulitis 06/12/2017  . Adjustment  disorder with mixed anxiety and depressed mood 06/10/2017  . Suicidal ideation 06/10/2017  . Arm wound, left, sequela 06/10/2017  . Dyspnea on exertion 05/29/2017  . Tachycardia 05/29/2017  . Hyperkalemia 05/22/2017  . Acute metabolic encephalopathy   . Anemia 04/23/2017  . Ascites 04/23/2017  . COPD (chronic obstructive pulmonary disease) ([place]) 04/23/2017  . Acute on chronic respiratory failure with hypoxia ([place]) 03/25/2017  . Arrhythmia 03/25/2017  . COPD GOLD 0 with flattening on inps f/v  09/27/2016  . Essential hypertension 09/27/2016  . Fluid overload 08/30/2016  . COPD exacerbation ([place]) 08/17/2016  . Hypertensive urgency 08/17/2016  . Respiratory failure ([place]) 08/17/2016  . Problem with dialysis access ([place]) 07/23/2016  . Chronic hepatitis B ([place]) 03/05/2014  . Chronic hepatitis C without hepatic coma ([place]) 03/05/2014  . Internal hemorrhoids with bleeding, swelling and itching 03/05/2014  . Thrombocytopenia ([place]) 03/05/2014  . Chest pain 02/27/2014  . Alcohol abuse 04/14/2009  . Cigarette smoker 04/14/2009  . GANGLION CYST 04/14/2009    Past Surgical History:  Procedure Laterality Date  . A/V FISTULAGRAM Left 05/26/2017   Procedure: A/V FISTULAGRAM;  Surgeon: Conrad , MD;  Location: [facility];  Service: Cardiovascular;  Laterality: Left;  . A/V FISTULAGRAM Right 11/18/2017   Procedure: A/V FISTULAGRAM - Right Arm;  Surgeon: Elam Dutch, MD;  Location: [facility];  Service: Cardiovascular;  Laterality: Right;  . APPLICATION OF WOUND VAC Left 06/14/2017   Procedure: APPLICATION OF WOUND VAC;  Surgeon: Katha Cabal, MD;  Location: [facility];  Service: Vascular;  Laterality: Left;  . AV FISTULA PLACEMENT  2012   BELIEVED WAS PLACED IN JUNE  . AV FISTULA PLACEMENT Right 08/09/2017   Procedure: Creation Right arm ARTERIOVENOUS BRACHIOCEPOHALIC FISTULA;  Surgeon: Elam Dutch, MD;  [facility] OR;  Service: Vascular;  Laterality: Right;   . AV FISTULA PLACEMENT Right 11/22/2017   Procedure: INSERTION OF ARTERIOVENOUS (AV) GORE-TEX GRAFT RIGHT UPPER ARM;  Surgeon: Elam Dutch, MD;  Location: [facility];  Service: Vascular;  Laterality: Right;  . BIOPSY  01/25/2018   Procedure: BIOPSY;  Surgeon: Jerene Bears, MD;  Location: [facility];  Service: Gastroenterology;;  . BIOPSY  04/10/2019   Procedure: BIOPSY;  Surgeon: Jerene Bears, MD;  Location: [facility];  Service: Gastroenterology;;  . COLONOSCOPY    . COLONOSCOPY WITH PROPOFOL N/A 01/25/2018   Procedure: COLONOSCOPY WITH PROPOFOL;  Surgeon: Jerene Bears, MD;  Location: [facility];  Service: Gastroenterology;  Laterality: N/A;  . CORONARY STENT INTERVENTION N/A 02/22/2017   Procedure: CORONARY STENT INTERVENTION;  Surgeon: Nigel Mormon, MD;  Location: [facility];  Service: Cardiovascular;  Laterality: N/A;  . ESOPHAGOGASTRODUODENOSCOPY (EGD) WITH PROPOFOL N/A 01/25/2018   Procedure: ESOPHAGOGASTRODUODENOSCOPY (EGD) WITH PROPOFOL;  Surgeon: Jerene Bears, MD;  Location: [facility];  Service: Gastroenterology;  Laterality: N/A;  . ESOPHAGOGASTRODUODENOSCOPY (EGD) WITH PROPOFOL N/A  04/10/2019   Procedure: ESOPHAGOGASTRODUODENOSCOPY (EGD) WITH PROPOFOL;  Surgeon: Jerene Bears, MD;  [facility];  Service: Gastroenterology;  Laterality: N/A;  . FLEXIBLE SIGMOIDOSCOPY N/A 07/15/2017   Procedure: FLEXIBLE SIGMOIDOSCOPY;  Surgeon: Carol Ada, MD;  Location: [facility];  Service: Endoscopy;  Laterality: N/A;  . HEMORRHOID BANDING    . I & D EXTREMITY Left 06/01/2017   Procedure: IRRIGATION AND DEBRIDEMENT LEFT ARM HEMATOMA WITH LIGATION OF LEFT ARM AV FISTULA;  Surgeon: Elam Dutch, MD;  Location: [facility];  Service: Vascular;  Laterality: Left;  . I & D EXTREMITY Left 06/14/2017   Procedure: IRRIGATION AND DEBRIDEMENT EXTREMITY;  Surgeon: Katha Cabal, MD;  Location: [facility];  Service: Vascular;  Laterality: Left;  . INSERTION OF DIALYSIS  CATHETER  05/30/2017  . INSERTION OF DIALYSIS CATHETER N/A 05/30/2017   Procedure: INSERTION OF DIALYSIS CATHETER;  Surgeon: Elam Dutch, MD;  Location: [facility];  Service: Vascular;  Laterality: N/A;  . IR PARACENTESIS  08/30/2017  . IR PARACENTESIS  09/29/2017  . IR PARACENTESIS  10/28/2017  . IR PARACENTESIS  11/09/2017  . IR PARACENTESIS  11/16/2017  . IR PARACENTESIS  11/28/2017  . IR PARACENTESIS  12/01/2017  . IR PARACENTESIS  12/06/2017  . IR PARACENTESIS  01/03/2018  . IR PARACENTESIS  01/23/2018  . IR PARACENTESIS  02/07/2018  . IR PARACENTESIS  02/21/2018  . IR PARACENTESIS  03/06/2018  . IR PARACENTESIS  03/17/2018  . IR PARACENTESIS  04/04/2018  . IR PARACENTESIS  12/28/2018  . IR PARACENTESIS  01/08/2019  . IR PARACENTESIS  01/23/2019  . IR PARACENTESIS  02/01/2019  . IR PARACENTESIS  02/19/2019  . IR PARACENTESIS  03/01/2019  . IR PARACENTESIS  03/15/2019  . IR PARACENTESIS  04/03/2019  . IR PARACENTESIS  04/12/2019  . IR PARACENTESIS  05/01/2019  . IR PARACENTESIS  05/08/2019  . IR PARACENTESIS  05/24/2019  . IR PARACENTESIS  06/12/2019  . IR PARACENTESIS  07/09/2019  . IR PARACENTESIS  07/27/2019  . IR PARACENTESIS  08/09/2019  . IR PARACENTESIS  08/21/2019  . IR PARACENTESIS  09/17/2019  . IR RADIOLOGIST EVAL & MGMT  02/14/2018  . IR RADIOLOGIST EVAL & MGMT  02/22/2019  . LEFT HEART CATH AND CORONARY ANGIOGRAPHY N/A 02/22/2017   Procedure: LEFT HEART CATH AND CORONARY ANGIOGRAPHY;  Surgeon: Nigel Mormon, MD;  Location: [facility];  Service: Cardiovascular;  Laterality: N/A;  . LIGATION OF ARTERIOVENOUS  FISTULA Left 09/15/6438   Procedure: Plication of Left Arm Arteriovenous Fistula;  Surgeon: Elam Dutch, MD;  Location: [facility];  Service: Vascular;  Laterality: Left;  . POLYPECTOMY    . POLYPECTOMY  01/25/2018   Procedure: POLYPECTOMY;  Surgeon: Jerene Bears, MD;  Location: [facility];  Service: Gastroenterology;;  . REVISON OF ARTERIOVENOUS FISTULA Left 3/47/4259    Procedure: PLICATION OF DISTAL ANEURYSMAL SEGEMENT OF LEFT UPPER ARM ARTERIOVENOUS FISTULA;  Surgeon: Elam Dutch, MD;  Location: [facility];  Service: Vascular;  Laterality: Left;  . REVISON OF ARTERIOVENOUS FISTULA Left 5/63/8756   Procedure: Plication of Left Upper Arm Fistula ;  Surgeon: Waynetta Sandy, MD;  Location: [facility];  Service: Vascular;  Laterality: Left;  . SKIN GRAFT SPLIT THICKNESS LEG / FOOT Left    SKIN GRAFT SPLIT THICKNESS LEFT ARM DONOR SITE: LEFT ANTERIOR THIGH  . SKIN SPLIT GRAFT Left 07/04/2017   Procedure: SKIN GRAFT SPLIT THICKNESS LEFT ARM DONOR SITE: LEFT ANTERIOR THIGH;  Surgeon: Elam Dutch, MD;  Location: [facility];  Service: Vascular;  Laterality: Left;  . THROMBECTOMY W/ EMBOLECTOMY Left 06/05/2017   Procedure: EXPLORATION OF LEFT ARM FOR BLEEDING; OVERSEWED PROXIMAL FISTULA;  Surgeon: Angelia Mould, MD;  Location: [facility];  Service: Vascular;  Laterality: Left;  . WOUND EXPLORATION Left 06/03/2017   Procedure: WOUND EXPLORATION WITH WOUND VAC APPLICATION TO LEFT ARM;  Surgeon: Angelia Mould, MD;  Location: [facility] OR;  Service: Vascular;  Laterality: Left;       Family History  Problem Relation Age of Onset  . Heart disease Mother   . Lung cancer Mother   . Heart disease Father   . Malignant hyperthermia Father   . COPD Father   . Throat cancer Sister   . Esophageal cancer Sister   . Hypertension Other   . COPD Other   . Colon cancer Neg Hx   . Colon polyps Neg Hx   . Rectal cancer Neg Hx   . Stomach cancer Neg Hx     Social History   Tobacco Use  . Smoking status: Current Every Day Smoker    Packs/day: 0.50    Years: 43.00    Pack years: 21.50    Types: Cigarettes    Start date: 08/13/1973  . Smokeless tobacco: Never Used  . Tobacco comment: i dont know i just make   Substance Use Topics  . Alcohol use: Not Currently    Comment: quit drinking in 2017  . Drug use: Not Currently    Types: Marijuana, Cocaine     Comment: reports using once every 3 months, 04-06-2019 was this     Home Medications Prior to Admission medications   Medication Sig Start Date End Date Taking? Authorizing Provider  diltiazem (TIAZAC) 240 MG 24 hr capsule Take 240 mg by mouth daily. 08/16/19  Yes [provider]  lactulose (CHRONULAC) 10 GM/15ML solution Take 45 mLs by mouth. 03/13/19  Yes [provider]  albuterol (VENTOLIN HFA) 108 (90 Base) MCG/ACT inhaler Inhale 2 puffs into the lungs every 6 (six) hours as needed for wheezing or shortness of breath.    [provider]  hydrALAZINE (APRESOLINE) 100 MG tablet Take 100 mg by mouth 2 (two) times daily.    [provider]  hydrOXYzine (ATARAX/VISTARIL) 25 MG tablet Take 25 mg by mouth 2 (two) times daily as needed. 08/21/19   [provider]  loperamide (IMODIUM A-D) 2 MG tablet Take 2 mg by mouth 4 (four) times daily as needed for diarrhea or loose stools.    [provider]  metoprolol tartrate (LOPRESSOR) 100 MG tablet Take 1 tablet (100 mg total) by mouth 2 (two) times daily. 06/04/19   Al Decant, MD  montelukast (SINGULAIR) 10 MG tablet Take 10 mg by mouth every evening.     [provider]  ondansetron (ZOFRAN) 4 MG tablet Take 4 mg by mouth every 4 (four) hours as needed for nausea or vomiting. 09/18/19   [provider]  ondansetron (ZOFRAN) 8 MG tablet Take 8 mg by mouth every 4 (four) hours as needed for nausea.  06/14/19   [provider]  Oxycodone HCl 10 MG TABS Take 10 mg by mouth 3 (three) times daily as needed. 08/25/19   [provider]  pantoprazole (PROTONIX) 40 MG tablet Take 40 mg by mouth daily.    [provider]  sevelamer carbonate (RENVELA) 800 MG tablet TAKE 4 TABS WITH MEALS AND 2 TABS  TWICE A DAY WITH SNACKS    [provider]  sulfamethoxazole-trimethoprim (BACTRIM DS) 800-160 MG tablet Take 1 tablet by mouth daily. 12/30/18   Geradine Girt, DO   SYMBICORT 160-4.5 MCG/ACT inhaler Inhale 2 puffs into the lungs 2 (two) times daily.  11/30/18   [provider]  zolpidem (AMBIEN) 10 MG tablet Take 10 mg by mouth at bedtime as needed for sleep.    [provider]    Allergies    Tramadol, Morphine, Morphine and related, Pollen extract, Acetaminophen, Aspirin, Clonidine, and Clonidine derivatives  Review of Systems   Review of Systems All other systems negative except as documented in the HPI. All pertinent positives and negatives as reviewed in the HPI. Physical Exam Updated Vital Signs BP (!) 87/51   Pulse (!) 136   Resp 18   SpO2 100%   Physical Exam Vitals and nursing note reviewed.  Constitutional:      General: He is not in acute distress.    Appearance: He is well-developed.  HENT:     Head: Normocephalic and atraumatic.  Eyes:     Pupils: Pupils are equal, round, and reactive to light.  Cardiovascular:     Rate and Rhythm: Normal rate and regular rhythm.     Heart sounds: Normal heart sounds. No murmur. No friction rub. No gallop.   Pulmonary:     Effort: Pulmonary effort is normal. No respiratory distress.     Breath sounds: Rales present. No wheezing or rhonchi.  Abdominal:     General: Bowel sounds are normal. There is no distension.     Palpations: Abdomen is soft.     Tenderness: There is no abdominal tenderness.  Musculoskeletal:     Cervical back: Normal range of motion and neck supple.  Skin:    General: Skin is warm and dry.     Capillary Refill: Capillary refill takes less than 2 seconds.     Findings: No erythema or rash.  Neurological:     Mental Status: He is alert and oriented to person, place, and time.     Motor: No abnormal muscle tone.     Coordination: Coordination normal.  Psychiatric:        Behavior: Behavior normal.     ED Results / Procedures / Treatments   Labs (all labs ordered are listed, but only abnormal results are displayed) Labs Reviewed  COMPREHENSIVE  METABOLIC PANEL - Abnormal; Notable for the following components:      Result Value   Sodium 132 (*)    Potassium >7.5 (*)    Chloride 92 (*)    CO2 21 (*)    BUN 72 (*)    Creatinine, Ser 10.76 (*)    Total Protein 6.4 (*)    Albumin 3.2 (*)    Alkaline Phosphatase 131 (*)    GFR calc non Af Amer 5 (*)    GFR calc Af Amer 5 (*)    Anion gap 19 (*)    All other components within normal limits  CBC WITH DIFFERENTIAL/PLATELET - Abnormal; Notable for the following components:   RBC 3.97 (*)    Hemoglobin 10.5 (*)    HCT 31.0 (*)    MCV 78.1 (*)    Platelets 126 (*)    All other components within normal limits  I-STAT CHEM 8, ED - Abnormal; Notable for the following components:   Sodium 129 (*)    Potassium 7.8 (*)    BUN 63 (*)  Creatinine, Ser 10.90 (*)    Glucose, Bld 65 (*)    Calcium, Ion 1.08 (*)    Hemoglobin 10.5 (*)    HCT 31.0 (*)    All other components within normal limits  CBG MONITORING, ED - Abnormal; Notable for the following components:   Glucose-Capillary 17 (*)    All other components within normal limits  CBG MONITORING, ED - Abnormal; Notable for the following components:   Glucose-Capillary 202 (*)    All other components within normal limits  CBG MONITORING, ED - Abnormal; Notable for the following components:   Glucose-Capillary 100 (*)    All other components within normal limits  RESPIRATORY PANEL BY RT PCR (FLU A&B, COVID)  POTASSIUM  TROPONIN I (HIGH SENSITIVITY)    EKG EKG Interpretation  Date/Time:  Tuesday September 25 2019 20:17:23 EDT Ventricular Rate:  122 PR Interval:    QRS Duration: 249 QT Interval:  476 QTC Calculation: 679 R Axis:   -86 Text Interpretation: Sinus or ectopic atrial tachycardia RBBB and LAFB LVH with secondary repolarization abnormality wide complex tachycardia Confirmed by Noemi Chapel [phone]) on 09/25/2019 11:01:29 PM   Radiology DG Chest Port 1 View  Result Date: 09/25/2019 CLINICAL DATA:  59 year old  male with cough and weakness. EXAM: PORTABLE CHEST 1 VIEW COMPARISON:  Chest radiograph dated 09/23/2019. FINDINGS: There is diffuse interstitial coarsening and mild chronic bronchitic changes. Faint bilateral mid lung field densities concerning for developing infiltrate. Clinical correlation is recommended. No large pleural effusion or pneumothorax. There is stable cardiomegaly. Atherosclerotic calcification of the aorta. No acute osseous pathology. IMPRESSION: Bilateral mid lung field densities concerning for developing infiltrate. Clinical correlation and follow-up recommended. Electronically Signed   By: Anner Crete M.D.   On: 09/25/2019 21:22    Procedures Procedures (including critical care time)  Medications Ordered in ED Medications  Chlorhexidine Gluconate Cloth 2 % PADS 6 each (has no administration in time range)  calcium gluconate 1 g/ 50 mL sodium chloride IVPB (1,000 mg Intravenous New Bag/Given 09/25/19 2324)  sodium bicarbonate injection 50 mEq (50 mEq Intravenous Given 09/25/19 2025)  calcium gluconate 1 g/ 50 mL sodium chloride IVPB (0 g Intravenous Stopped 09/25/19 2047)  insulin aspart (novoLOG) injection 10 Units (10 Units Intravenous Given 09/25/19 2100)  dextrose 50 % solution 50 mL (50 mLs Intravenous Given 09/25/19 2056)  sodium zirconium cyclosilicate (LOKELMA) packet 10 g (10 g Oral Given 09/25/19 2054)  diltiazem (CARDIZEM) injection 20 mg (20 mg Intravenous Given 09/25/19 2101)  sodium bicarbonate injection 50 mEq (50 mEq Intravenous Given 09/25/19 2324)  dextrose 50 % solution 50 mL (50 mLs Intravenous Given 09/25/19 2322)  propofol (DIPRIVAN) 1000 MG/100ML infusion (30 mcg/kg/min  New Bag/Given 09/25/19 2323)  dextrose 50 % solution 50 mL (50 mLs Intravenous Given 09/25/19 2333)    ED Course  I have reviewed the triage vital signs and the nursing notes.  Pertinent labs & imaging results that were available during my care of the patient were reviewed by me and  considered in my medical decision making (see chart for details).   The patient deteriorated and was having more significant respiratory distress.  The patient's blood sugar was not checked in a timely fashion and was found to be 17.  The patient was given insulin and D50 due to his extremely high potassium and his white complex QRS tachycardia.  The patient was ultimately intubated.  I spoke to nephrology early on in the patient's visit about his potassium and the  need for emergent dialysis.  I then recalled them and asked them to see the patient more emergently due to his decompensation.  Patient's heart rate has been fluctuating from the 180s down to the 120s.  Patient will ultimately need dialysis to help stabilize his cardiac function.  CRITICAL CARE Performed by: Resa Miner Aleks Nawrot Total critical care time :35 minutes Critical care time was  exclusive of separately billable procedures and treating other patients. Critical care was necessary to treat or prevent imminent or life-threatening deterioration. Critical care was time spent personally by me on the following activities: development of treatment plan with patient and/or surrogate as well as nursing, discussions with consultants, evaluation of patient's response to treatment, examination of patient, obtaining history from patient or surrogate, ordering and performing treatments and interventions, ordering and review of laboratory studies, ordering and review of radiographic studies, pulse oximetry and re-evaluation of patient's condition.  MDM Rules/Calculators/A&P                      Critical care was also consulted on the patient and will evaluate the patient for admission and he will be dialyzed once he gets to the unit. Final Clinical Impression(s) / ED Diagnoses Final diagnoses:  Hyperkalemia  Acute respiratory failure with hypoxia [facility])    Rx / DC Orders ED Discharge Orders    None       Dalia Heading,  PA-C 09/25/19 2356    Noemi Chapel, MD 09/27/19 [phone]

## 2019-09-25 NOTE — Progress Notes (Signed)
Called by ED to evaluate WCT.  There is not much I can do in a patient with non compliance, missing follow up, medications, dialysis.   Treat symptomatically and make DNR and palliative care.   Adrian Prows, MD, [facility] 09/25/2019, 10:58 PM [facility] [place] Office: [phone]

## 2019-09-25 NOTE — ED Triage Notes (Signed)
Patient here from home, generalized pain all over.  Patient has not been to his dialysis since Friday.  Patient does have shortness of breath.  Patient with peaked Bryan Lemma and Vtach with EMS.

## 2019-09-25 NOTE — Consult Note (Addendum)
Reason for Consult: ESRD Referring Physician: Dr. Roel Cluck  Chief Complaint: Hyperkalemia   Outpt HD: [facility]  4h   65kg  2/2 bath  450/800  R arm AVG   Assessment/ Plan: 1. Hyperkalemia - given Lokelma, D50, Insulin and IV Ca in ED.  Will plan on acute treatment tonight.   EKG showing old LBBB unchanged from prior and wide complex tachycardia.  update: issues with RO and not able to dialyze (received less 30 min of HD). Low threshold to  start on CRRT but we should be able to find the adapter to connect the RO in under 2 hours. Rhythm not the persistent wide complex tachycardia that I saw in the ED.   2. ESRD - on HD MWF.  Will get HD later tonight as above.  3. COVID+ - per primary team 4. Chronic hepatitis B 5. H/o hep C 6. Htn - cont meds w/ metoprolol/ hydralazine 7. Cirrhosis - on lactulose 8. COPD - cont meds   HPI: Antiono Ettinger is an 59 y.o. male w/ hx of MI, HTN, hep C, chron hep B, ESRD on HD, COPD, anemia , cirrhosis and recurrent ascites presenting to ED w/ nausea , weakness.  Missed entire week of dialysis prior week and last week only received 2 treatments with both shortened treatments including one under 2 hours. He's now here in the ED with a K of 7.8 and widened QRS tachycardia treated with protocol in the ED. He denies dyspnea, chest pain, fevers and just had a LVP yesterday. He complains of generalized body aches.  We are  asked to see for dialysis.    ROS Pertinent items are noted in HPI.  Chemistry and CBC: Creat  Date/Time Value Ref Range Status  02/15/2017 02:25 PM 9.18 (H) 0.70 - 1.33 mg/dL Final    Comment:      For patients > or = 57 years of age: The upper reference limit for Creatinine is approximately 13% higher for people identified as African-American.     11/16/2016 04:19 PM 8.59 (H) 0.70 - 1.33 mg/dL Final    Comment:    Result repeated and verified.   For patients > or = 57 years of age: The upper reference limit  for Creatinine is approximately 13% higher for people identified as African-American.      Creatinine, Ser  Date/Time Value Ref Range Status  09/25/2019 08:26 PM 10.90 (H) 0.61 - 1.24 mg/dL Final  09/25/2019 08:15 PM 10.76 (H) 0.61 - 1.24 mg/dL Final  09/23/2019 09:40 PM 7.96 (H) 0.61 - 1.24 mg/dL Final  08/12/2019 05:45 AM 6.12 (H) 0.61 - 1.24 mg/dL Final  08/10/2019 07:03 AM 7.76 (H) 0.61 - 1.24 mg/dL Final  08/08/2019 10:21 AM 7.84 (H) 0.61 - 1.24 mg/dL Final  08/01/2019 06:53 PM 9.94 (H) 0.61 - 1.24 mg/dL Final  08/01/2019 04:00 AM 8.94 (H) 0.61 - 1.24 mg/dL Final  07/29/2019 04:25 PM 7.71 (H) 0.61 - 1.24 mg/dL Final  07/29/2019 05:19 AM 7.11 (H) 0.61 - 1.24 mg/dL Final  07/23/2019 04:50 AM 9.95 (H) 0.61 - 1.24 mg/dL Final  07/22/2019 09:44 AM 9.10 (H) 0.61 - 1.24 mg/dL Final  07/22/2019 09:36 AM 8.59 (H) 0.61 - 1.24 mg/dL Final  07/18/2019 03:32 AM 9.08 (H) 0.61 - 1.24 mg/dL Final  07/17/2019 02:25 PM 9.30 (H) 0.61 - 1.24 mg/dL Final  07/17/2019 02:00 PM 8.50 (H) 0.61 - 1.24 mg/dL Final  07/16/2019 04:54 AM 11.49 (H) 0.61 - 1.24 mg/dL Final  07/15/2019 08:19  PM 11.61 (H) 0.61 - 1.24 mg/dL Final  07/15/2019 11:15 AM 10.82 (H) 0.61 - 1.24 mg/dL Final  07/14/2019 03:55 PM 9.63 (H) 0.61 - 1.24 mg/dL Final  07/09/2019 05:15 AM 7.83 (H) 0.61 - 1.24 mg/dL Final  06/09/2019 06:00 PM 10.57 (H) 0.61 - 1.24 mg/dL Final  06/09/2019 10:08 AM 10.55 (H) 0.61 - 1.24 mg/dL Final  06/08/2019 04:01 PM 9.62 (H) 0.61 - 1.24 mg/dL Final  06/07/2019 04:58 PM 8.15 (H) 0.61 - 1.24 mg/dL Final  06/03/2019 08:00 AM 6.99 (H) 0.61 - 1.24 mg/dL Final  06/02/2019 12:15 PM 6.00 (H) 0.61 - 1.24 mg/dL Final  06/02/2019 12:07 PM 5.96 (H) 0.61 - 1.24 mg/dL Final  06/01/2019 05:06 AM 6.72 (H) 0.61 - 1.24 mg/dL Final  05/31/2019 09:34 AM 5.08 (H) 0.61 - 1.24 mg/dL Final  05/30/2019 04:12 AM 6.87 (H) 0.61 - 1.24 mg/dL Final  05/29/2019 07:30 PM 6.53 (H) 0.61 - 1.24 mg/dL Final  05/29/2019 04:14 PM 6.27 (H)  0.61 - 1.24 mg/dL Final  05/29/2019 09:00 AM 5.73 (H) 0.61 - 1.24 mg/dL Final  05/28/2019 05:23 AM 6.05 (H) 0.61 - 1.24 mg/dL Final    Comment:    DELTA CHECK NOTED  05/27/2019 07:05 PM 9.95 (H) 0.61 - 1.24 mg/dL Final  05/27/2019 04:00 PM 10.00 (H) 0.61 - 1.24 mg/dL Final  05/27/2019 09:44 AM 9.97 (H) 0.61 - 1.24 mg/dL Final  05/20/2019 08:33 PM 7.36 (H) 0.61 - 1.24 mg/dL Final  05/14/2019 06:01 AM 8.78 (H) 0.61 - 1.24 mg/dL Final  05/07/2019 09:36 AM 9.87 (H) 0.61 - 1.24 mg/dL Final  04/19/2019 06:53 PM 8.30 (H) 0.61 - 1.24 mg/dL Final  04/16/2019 12:36 AM 7.91 (H) 0.61 - 1.24 mg/dL Final  04/10/2019 11:43 AM 5.00 (H) 0.61 - 1.24 mg/dL Final  03/20/2019 10:48 AM 10.33 (H) 0.61 - 1.24 mg/dL Final  03/19/2019 09:32 AM 9.76 (H) 0.61 - 1.24 mg/dL Final  03/09/2019 05:29 AM 6.58 (H) 0.61 - 1.24 mg/dL Final  03/08/2019 10:00 PM 9.80 (H) 0.61 - 1.24 mg/dL Final  03/08/2019 06:57 PM 9.50 (H) 0.61 - 1.24 mg/dL Final  03/08/2019 06:50 PM 9.74 (H) 0.61 - 1.24 mg/dL Final  02/19/2019 10:07 AM 8.14 (H) 0.61 - 1.24 mg/dL Final  02/18/2019 01:33 PM 6.68 (H) 0.61 - 1.24 mg/dL Final   Recent Labs  Lab 09/23/19 2140 09/25/19 2015 09/25/19 2026  NA 135 132* 129*  K 6.2* >7.5* 7.8*  CL 96* 92* 98  CO2 26 21*  --   GLUCOSE 110* 73 65*  BUN 49* 72* 63*  CREATININE 7.96* 10.76* 10.90*  CALCIUM 9.8 9.7  --    Recent Labs  Lab 09/23/19 2140 09/25/19 2015 09/25/19 2026  WBC 5.7 6.1  --   NEUTROABS 4.3 4.7  --   HGB 9.6* 10.5* 10.5*  HCT 27.9* 31.0* 31.0*  MCV 77.9* 78.1*  --   PLT 145* 126*  --    Liver Function Tests: Recent Labs  Lab 09/23/19 2140 09/25/19 2015  AST 48* 41  ALT 23 19  ALKPHOS 122 131*  BILITOT 0.4 0.8  PROT 6.6 6.4*  ALBUMIN 3.4* 3.2*   No results for input(s): LIPASE, AMYLASE in the last 168 hours. No results for input(s): AMMONIA in the last 168 hours. Cardiac Enzymes: No results for input(s): CKTOTAL, CKMB, CKMBINDEX, TROPONINI in the last 168  hours. Iron Studies: No results for input(s): IRON, TIBC, TRANSFERRIN, FERRITIN in the last 72 hours. PT/INR: @LABRCNTIP (inr:5)  Xrays/Other Studies: ) Results for orders placed  or performed during the hospital encounter of 09/25/19 (from the past 48 hour(s))  Comprehensive metabolic panel     Status: Abnormal   Collection Time: 09/25/19  8:15 PM  Result Value Ref Range   Sodium 132 (L) 135 - 145 mmol/L   Potassium >7.5 (HH) 3.5 - 5.1 mmol/L    Comment: NO VISIBLE HEMOLYSIS CRITICAL RESULT CALLED TO, READ BACK BY AND VERIFIED WITH: MCGURY K,RN 09/25/19 2135 WAYK    Chloride 92 (L) 98 - 111 mmol/L   CO2 21 (L) 22 - 32 mmol/L   Glucose, Bld 73 70 - 99 mg/dL    Comment: Glucose reference range applies only to samples taken after fasting for at least 8 hours.   BUN 72 (H) 6 - 20 mg/dL   Creatinine, Ser 10.76 (H) 0.61 - 1.24 mg/dL   Calcium 9.7 8.9 - 10.3 mg/dL   Total Protein 6.4 (L) 6.5 - 8.1 g/dL   Albumin 3.2 (L) 3.5 - 5.0 g/dL   AST 41 15 - 41 U/L   ALT 19 0 - 44 U/L   Alkaline Phosphatase 131 (H) 38 - 126 U/L   Total Bilirubin 0.8 0.3 - 1.2 mg/dL   GFR calc non Af Amer 5 (L) >60 mL/min   GFR calc Af Amer 5 (L) >60 mL/min   Anion gap 19 (H) 5 - 15    Comment: Performed at [facility], [place] [address] [place], [place] [address]  CBC with Differential     Status: Abnormal   Collection Time: 09/25/19  8:15 PM  Result Value Ref Range   WBC 6.1 4.0 - 10.5 K/uL   RBC 3.97 (L) 4.22 - 5.81 MIL/uL   Hemoglobin 10.5 (L) 13.0 - 17.0 g/dL   HCT 31.0 (L) 39.0 - 52.0 %   MCV 78.1 (L) 80.0 - 100.0 fL   MCH 26.4 26.0 - 34.0 pg   MCHC 33.9 30.0 - 36.0 g/dL   RDW 15.0 11.5 - 15.5 %   Platelets 126 (L) 150 - 400 K/uL   nRBC 0.0 0.0 - 0.2 %   Neutrophils Relative % 77 %   Neutro Abs 4.7 1.7 - 7.7 K/uL   Lymphocytes Relative 15 %   Lymphs Abs 0.9 0.7 - 4.0 K/uL   Monocytes Relative 6 %   Monocytes Absolute 0.3 0.1 - 1.0 K/uL   Eosinophils Relative 0 %   Eosinophils Absolute  0.0 0.0 - 0.5 K/uL   Basophils Relative 1 %   Basophils Absolute 0.1 0.0 - 0.1 K/uL   Immature Granulocytes 1 %   Abs Immature Granulocytes 0.05 0.00 - 0.07 K/uL    Comment: Performed at [facility], [address] [place],  [address]  I-stat chem 8, ED (not at [facility] or [facility])     Status: Abnormal   Collection Time: 09/25/19  8:26 PM  Result Value Ref Range   Sodium 129 (L) 135 - 145 mmol/L   Potassium 7.8 (HH) 3.5 - 5.1 mmol/L   Chloride 98 98 - 111 mmol/L   BUN 63 (H) 6 - 20 mg/dL   Creatinine, Ser 10.90 (H) 0.61 - 1.24 mg/dL   Glucose, Bld 65 (L) 70 - 99 mg/dL    Comment: Glucose reference range applies only to samples taken after fasting for at least 8 hours.   Calcium, Ion 1.08 (L) 1.15 - 1.40 mmol/L   TCO2 26 22 - 32 mmol/L   Hemoglobin 10.5 (L) 13.0 - 17.0 g/dL  HCT 31.0 (L) 39.0 - 52.0 %   Comment NOTIFIED PHYSICIAN    DG Chest Port 1 View  Result Date: 09/25/2019 CLINICAL DATA:  59 year old male with cough and weakness. EXAM: PORTABLE CHEST 1 VIEW COMPARISON:  Chest radiograph dated 09/23/2019. FINDINGS: There is diffuse interstitial coarsening and mild chronic bronchitic changes. Faint bilateral mid lung field densities concerning for developing infiltrate. Clinical correlation is recommended. No large pleural effusion or pneumothorax. There is stable cardiomegaly. Atherosclerotic calcification of the aorta. No acute osseous pathology. IMPRESSION: Bilateral mid lung field densities concerning for developing infiltrate. Clinical correlation and follow-up recommended. Electronically Signed   By: Anner Crete M.D.   On: 09/25/2019 21:22    PMH:   Past Medical History:  Diagnosis Date  . Anemia   . Anxiety   . Arthritis    left shoulder  . Atherosclerosis of aorta ([place])   . Cardiomegaly   . Chest pain    DATE UNKNOWN, C/O PERIODICALLY  . Cocaine abuse ([place])   . COPD exacerbation ([place]) 08/17/2016  . Coronary artery disease    stent 02/22/17  . ESRD (end  stage renal disease) on dialysis ([place])    "[name]; MWF" (07/04/2017)  . GERD (gastroesophageal reflux disease)    DATE UNKNOWN  . Hemorrhoids   . Hepatitis B, chronic ([place])   . Hepatitis C   . History of kidney stones   . Hyperkalemia   . Hypertension   . Kidney failure   . Metabolic bone disease    Patient denies  . Mitral stenosis   . Myocardial infarction ([place])   . Pneumonia   . Pulmonary edema   . Solitary rectal ulcer syndrome 07/2017   at flex sig for rectal bleeding  . Tubular adenoma of colon     PSH:   Past Surgical History:  Procedure Laterality Date  . A/V FISTULAGRAM Left 05/26/2017   Procedure: A/V FISTULAGRAM;  Surgeon: Conrad Brookville, MD;  Location: [place] [facility];  Service: Cardiovascular;  Laterality: Left;  . A/V FISTULAGRAM Right 11/18/2017   Procedure: A/V FISTULAGRAM - Right Arm;  Surgeon: Elam Dutch, MD;  Location: [place] [facility];  Service: Cardiovascular;  Laterality: Right;  . APPLICATION OF WOUND VAC Left 06/14/2017   Procedure: APPLICATION OF WOUND VAC;  Surgeon: Katha Cabal, MD;  Location: [facility];  Service: Vascular;  Laterality: Left;  . AV FISTULA PLACEMENT  2012   BELIEVED WAS PLACED IN JUNE  . AV FISTULA PLACEMENT Right 08/09/2017   Procedure: Creation Right arm ARTERIOVENOUS BRACHIOCEPOHALIC FISTULA;  Surgeon: Elam Dutch, MD;  Location: [facility];  Service: Vascular;  Laterality: Right;  . AV FISTULA PLACEMENT Right 11/22/2017   Procedure: INSERTION OF ARTERIOVENOUS (AV) GORE-TEX GRAFT RIGHT UPPER ARM;  Surgeon: Elam Dutch, MD;  Location: [place];  Service: Vascular;  Laterality: Right;  . BIOPSY  01/25/2018   Procedure: BIOPSY;  Surgeon: Jerene Bears, MD;  Location: [place];  Service: Gastroenterology;;  . BIOPSY  04/10/2019   Procedure: BIOPSY;  Surgeon: Jerene Bears, MD;  Location: [facility];  Service: Gastroenterology;;  . COLONOSCOPY    . COLONOSCOPY WITH PROPOFOL N/A 01/25/2018   Procedure:  COLONOSCOPY WITH PROPOFOL;  Surgeon: Jerene Bears, MD;  Location: [place];  Service: Gastroenterology;  Laterality: N/A;  . CORONARY STENT INTERVENTION N/A 02/22/2017   Procedure: CORONARY STENT INTERVENTION;  Surgeon: Nigel Mormon, MD;  Location: [place] [facility];  Service: Cardiovascular;  Laterality: N/A;  . ESOPHAGOGASTRODUODENOSCOPY (EGD) WITH PROPOFOL N/A 01/25/2018   Procedure: ESOPHAGOGASTRODUODENOSCOPY (EGD) WITH PROPOFOL;  Surgeon: Jerene Bears, MD;  Location: [facility];  Service: Gastroenterology;  Laterality: N/A;  . ESOPHAGOGASTRODUODENOSCOPY (EGD) WITH PROPOFOL N/A 04/10/2019   Procedure: ESOPHAGOGASTRODUODENOSCOPY (EGD) WITH PROPOFOL;  Surgeon: Jerene Bears, MD;  Location: [facility];  Service: Gastroenterology;  Laterality: N/A;  . FLEXIBLE SIGMOIDOSCOPY N/A 07/15/2017   Procedure: FLEXIBLE SIGMOIDOSCOPY;  Surgeon: Carol Ada, MD;  Location: [place];  Service: Endoscopy;  Laterality: N/A;  . HEMORRHOID BANDING    . I & D EXTREMITY Left 06/01/2017   Procedure: IRRIGATION AND DEBRIDEMENT LEFT ARM HEMATOMA WITH LIGATION OF LEFT ARM AV FISTULA;  Surgeon: Elam Dutch, MD;  Location: [place];  Service: Vascular;  Laterality: Left;  . I & D EXTREMITY Left 06/14/2017   Procedure: IRRIGATION AND DEBRIDEMENT EXTREMITY;  Surgeon: Katha Cabal, MD;  Location: [facility];  Service: Vascular;  Laterality: Left;  . INSERTION OF DIALYSIS CATHETER  05/30/2017  . INSERTION OF DIALYSIS CATHETER N/A 05/30/2017   Procedure: INSERTION OF DIALYSIS CATHETER;  Surgeon: Elam Dutch, MD;  Location: [place];  Service: Vascular;  Laterality: N/A;  . IR PARACENTESIS  08/30/2017  . IR PARACENTESIS  09/29/2017  . IR PARACENTESIS  10/28/2017  . IR PARACENTESIS  11/09/2017  . IR PARACENTESIS  11/16/2017  . IR PARACENTESIS  11/28/2017  . IR PARACENTESIS  12/01/2017  . IR PARACENTESIS  12/06/2017  . IR PARACENTESIS  01/03/2018  . IR PARACENTESIS  01/23/2018  . IR PARACENTESIS   02/07/2018  . IR PARACENTESIS  02/21/2018  . IR PARACENTESIS  03/06/2018  . IR PARACENTESIS  03/17/2018  . IR PARACENTESIS  04/04/2018  . IR PARACENTESIS  12/28/2018  . IR PARACENTESIS  01/08/2019  . IR PARACENTESIS  01/23/2019  . IR PARACENTESIS  02/01/2019  . IR PARACENTESIS  02/19/2019  . IR PARACENTESIS  03/01/2019  . IR PARACENTESIS  03/15/2019  . IR PARACENTESIS  04/03/2019  . IR PARACENTESIS  04/12/2019  . IR PARACENTESIS  05/01/2019  . IR PARACENTESIS  05/08/2019  . IR PARACENTESIS  05/24/2019  . IR PARACENTESIS  06/12/2019  . IR PARACENTESIS  07/09/2019  . IR PARACENTESIS  07/27/2019  . IR PARACENTESIS  08/09/2019  . IR PARACENTESIS  08/21/2019  . IR PARACENTESIS  09/17/2019  . IR RADIOLOGIST EVAL & MGMT  02/14/2018  . IR RADIOLOGIST EVAL & MGMT  02/22/2019  . LEFT HEART CATH AND CORONARY ANGIOGRAPHY N/A 02/22/2017   Procedure: LEFT HEART CATH AND CORONARY ANGIOGRAPHY;  Surgeon: Nigel Mormon, MD;  Location: [place] [facility];  Service: Cardiovascular;  Laterality: N/A;  . LIGATION OF ARTERIOVENOUS  FISTULA Left 08/16/9561   Procedure: Plication of Left Arm Arteriovenous Fistula;  Surgeon: Elam Dutch, MD;  Location: [place];  Service: Vascular;  Laterality: Left;  . POLYPECTOMY    . POLYPECTOMY  01/25/2018   Procedure: POLYPECTOMY;  Surgeon: Jerene Bears, MD;  Location: [place];  Service: Gastroenterology;;  . REVISON OF ARTERIOVENOUS FISTULA Left 8/75/6433   Procedure: PLICATION OF DISTAL ANEURYSMAL SEGEMENT OF LEFT UPPER ARM ARTERIOVENOUS FISTULA;  Surgeon: Elam Dutch, MD;  Location: [place];  Service: Vascular;  Laterality: Left;  . REVISON OF ARTERIOVENOUS FISTULA Left 2/95/1884   Procedure: Plication of Left Upper Arm Fistula ;  Surgeon: Waynetta Sandy, MD;  Location: [place];  Service: Vascular;  Laterality: Left;  . SKIN GRAFT SPLIT THICKNESS LEG /  FOOT Left    SKIN GRAFT SPLIT THICKNESS LEFT ARM DONOR SITE: LEFT ANTERIOR THIGH  . SKIN SPLIT GRAFT Left 07/04/2017    Procedure: SKIN GRAFT SPLIT THICKNESS LEFT ARM DONOR SITE: LEFT ANTERIOR THIGH;  Surgeon: Elam Dutch, MD;  Location: [place];  Service: Vascular;  Laterality: Left;  . THROMBECTOMY W/ EMBOLECTOMY Left 06/05/2017   Procedure: EXPLORATION OF LEFT ARM FOR BLEEDING; OVERSEWED PROXIMAL FISTULA;  Surgeon: Angelia Mould, MD;  Location: [place];  Service: Vascular;  Laterality: Left;  . WOUND EXPLORATION Left 06/03/2017   Procedure: WOUND EXPLORATION WITH WOUND VAC APPLICATION TO LEFT ARM;  Surgeon: Angelia Mould, MD;  Location: [place];  Service: Vascular;  Laterality: Left;    Allergies:  Allergies  Allergen Reactions  . Tramadol Itching and Other (See Comments)  . Morphine     Other reaction(s): Other (See Comments)  . Morphine And Related Other (See Comments)    Stomach pain  . Pollen Extract     Other reaction(s): Sneezing (finding)  . Acetaminophen Nausea Only    Stomach ache Other reaction(s): Other (See Comments) Stomach ache  . Aspirin Other (See Comments) and Itching    STOMACH PAIN Other reaction(s): Unknown STOMACH PAIN  . Clonidine Itching  . Clonidine Derivatives Itching    Medications:   Prior to Admission medications   Medication Sig Start Date End Date Taking? Authorizing Provider  diltiazem (TIAZAC) 240 MG 24 hr capsule Take 240 mg by mouth daily. 08/16/19  Yes [provider]  lactulose (CHRONULAC) 10 GM/15ML solution Take 45 mLs by mouth. 03/13/19  Yes [provider]  albuterol (VENTOLIN HFA) 108 (90 Base) MCG/ACT inhaler Inhale 2 puffs into the lungs every 6 (six) hours as needed for wheezing or shortness of breath.    [provider]  hydrALAZINE (APRESOLINE) 100 MG tablet Take 100 mg by mouth 2 (two) times daily.    [provider]  hydrOXYzine (ATARAX/VISTARIL) 25 MG tablet Take 25 mg by mouth 2 (two) times daily as needed. 08/21/19   [provider]  loperamide (IMODIUM A-D) 2 MG tablet Take 2 mg by  mouth 4 (four) times daily as needed for diarrhea or loose stools.    [provider]  metoprolol tartrate (LOPRESSOR) 100 MG tablet Take 1 tablet (100 mg total) by mouth 2 (two) times daily. 06/04/19   Al Decant, MD  montelukast (SINGULAIR) 10 MG tablet Take 10 mg by mouth every evening.     [provider]  ondansetron (ZOFRAN) 4 MG tablet Take 4 mg by mouth every 4 (four) hours as needed for nausea or vomiting. 09/18/19   [provider]  ondansetron (ZOFRAN) 8 MG tablet Take 8 mg by mouth every 4 (four) hours as needed for nausea.  06/14/19   [provider]  Oxycodone HCl 10 MG TABS Take 10 mg by mouth 3 (three) times daily as needed. 08/25/19   [provider]  pantoprazole (PROTONIX) 40 MG tablet Take 40 mg by mouth daily.    [provider]  sevelamer carbonate (RENVELA) 800 MG tablet TAKE 4 TABS WITH MEALS AND 2 TABS TWICE A DAY WITH SNACKS    [provider]  sulfamethoxazole-trimethoprim (BACTRIM DS) 800-160 MG tablet Take 1 tablet by mouth daily. 12/30/18   Geradine Girt, DO  SYMBICORT 160-4.5 MCG/ACT inhaler Inhale 2 puffs into the lungs 2 (two) times daily.  11/30/18   [provider]  zolpidem (AMBIEN) 10 MG tablet Take  10 mg by mouth at bedtime as needed for sleep.    [provider]    Discontinued Meds:   Medications Discontinued During This Encounter  Medication Reason  . calcium gluconate 1 g in sodium chloride 0.9 % 100 mL IVPB     Social History:  reports that he has been smoking cigarettes. He started smoking about 46 years ago. He has a 21.50 pack-year smoking history. He has never used smokeless tobacco. He reports previous alcohol use. He reports previous drug use. Drugs: Marijuana and Cocaine.  Family History:   Family History  Problem Relation Age of Onset  . Heart disease Mother   . Lung cancer Mother   . Heart disease Father   . Malignant hyperthermia Father   . COPD Father   .  Throat cancer Sister   . Esophageal cancer Sister   . Hypertension Other   . COPD Other   . Colon cancer Neg Hx   . Colon polyps Neg Hx   . Rectal cancer Neg Hx   . Stomach cancer Neg Hx     Blood pressure (!) 102/41, pulse 82, resp. rate 18, SpO2 96 %. General appearance: alert and cooperative Head: Normocephalic, without obvious abnormality, atraumatic Eyes: negative Neck: no adenopathy, no carotid bruit, supple, symmetrical, trachea midline and thyroid not enlarged, symmetric, no tenderness/mass/nodules Back: symmetric, no curvature. ROM normal. No CVA tenderness. Chest wall: no tenderness Cardio: Tachycardic GI: soft, non-tender; bowel sounds normal; no masses,  no organomegaly Extremities: edema tr Pulses: 2+ and symmetric Access: RUA AVG       Dwana Melena, MD 09/25/2019, 10:03 PM

## 2019-09-25 NOTE — ED Notes (Signed)
cxr at bedside

## 2019-09-25 NOTE — ED Notes (Signed)
EDP notified of HR of 7.8 and HR 230s

## 2019-09-25 NOTE — ED Notes (Signed)
All meds given per MAR. Name/DOB verified with pt 

## 2019-09-25 NOTE — ED Provider Notes (Signed)
Medical screening examination/treatment/procedure(s) were conducted as a shared visit with non-physician practitioner(s) and myself.  I personally evaluated the patient during the encounter.  Clinical Impression:   Final diagnoses:  Hyperkalemia  Acute respiratory failure with hypoxia (HCC)  Endotracheally intubated     EKG Interpretation  Date/Time:  Tuesday September 25 2019 20:17:23 EDT Ventricular Rate:  122 PR Interval:    QRS Duration: 249 QT Interval:  476 QTC Calculation: 679 R Axis:   -86 Text Interpretation: Sinus or ectopic atrial tachycardia RBBB and LAFB LVH with secondary repolarization abnormality wide complex tachycardia Confirmed by Noemi Chapel [phone]) on 09/25/2019 11:01:29 PM      Procedure Name: Intubation Date/Time: 09/25/2019 11:23 PM Performed by: Noemi Chapel, MD Pre-anesthesia Checklist: Patient identified, Patient being monitored, Emergency Drugs available, Timeout performed and Suction available Oxygen Delivery Method: Non-rebreather mask Preoxygenation: Pre-oxygenation with 100% oxygen Induction Type: Rapid sequence Ventilation: Mask ventilation without difficulty Laryngoscope Size: Mac and 4 Grade View: Grade III Tube size: 7.5 mm Number of attempts: 1 Airway Equipment and Method: Stylet (direct laryngoscopy) Placement Confirmation: ETT inserted through vocal cords under direct vision,  CO2 detector and Breath sounds checked- equal and bilateral Secured at: 23 cm Tube secured with: ETT holder Dental Injury: Teeth and Oropharynx as per pre-operative assessment  Difficulty Due To: Difficulty was unanticipated Comments:        .Critical Care Performed by: Noemi Chapel, MD Authorized by: Noemi Chapel, MD   Critical care provider statement:    Critical care time (minutes):  35   Critical care time was exclusive of:  Separately billable procedures and treating other patients and teaching time   Critical care was necessary to treat or  prevent imminent or life-threatening deterioration of the following conditions:  Metabolic crisis and respiratory failure   Critical care was time spent personally by me on the following activities:  Blood draw for specimens, development of treatment plan with patient or surrogate, discussions with consultants, evaluation of patient's response to treatment, examination of patient, obtaining history from patient or surrogate, ordering and performing treatments and interventions, ordering and review of laboratory studies, ordering and review of radiographic studies, pulse oximetry, re-evaluation of patient's condition and review of old charts   This patient is a ill-appearing 59 year old male presenting to the hospital in acute respiratory distress as well as acute cardiac distress with a heart rate of 150 to 160 bpm, wide-complex QRS, in fact it is much wider than his baseline.  The patient is noncompliant with his dialysis, he is noncompliant with his medications and frequently comes in with uncontrolled atrial fibrillation.  At this time the patient is in acute distress, he has become obtunded after aggressive treatment for his hyperglycemia, he has been given bicarbonate, calcium, insulin, glucose as well as D50.  He required a second dose of D50 secondary to hypoglycemia and ultimately because of ongoing respiratory distress and progressive somnolence due to his severe hyperkalemia and respiratory distress he required intubation.  I personally discussed the care with the intensivist, he will send an intensive care unit provider down to admit the patient.  Nephrology is on board and will dialyze the patient is does he gets to the intensive care unit.  Chest x-ray performed confirming tube position.    Noemi Chapel, MD 09/27/19 [phone]

## 2019-09-26 ENCOUNTER — Inpatient Hospital Stay (HOSPITAL_COMMUNITY): Payer: Medicare Other

## 2019-09-26 DIAGNOSIS — G9341 Metabolic encephalopathy: Secondary | ICD-10-CM | POA: Diagnosis present

## 2019-09-26 DIAGNOSIS — J9601 Acute respiratory failure with hypoxia: Secondary | ICD-10-CM

## 2019-09-26 DIAGNOSIS — U071 COVID-19: Secondary | ICD-10-CM | POA: Diagnosis present

## 2019-09-26 DIAGNOSIS — I2510 Atherosclerotic heart disease of native coronary artery without angina pectoris: Secondary | ICD-10-CM | POA: Diagnosis present

## 2019-09-26 DIAGNOSIS — I700 Atherosclerosis of aorta: Secondary | ICD-10-CM | POA: Diagnosis present

## 2019-09-26 DIAGNOSIS — D631 Anemia in chronic kidney disease: Secondary | ICD-10-CM | POA: Diagnosis present

## 2019-09-26 DIAGNOSIS — A414 Sepsis due to anaerobes: Secondary | ICD-10-CM | POA: Diagnosis present

## 2019-09-26 DIAGNOSIS — Z978 Presence of other specified devices: Secondary | ICD-10-CM | POA: Diagnosis not present

## 2019-09-26 DIAGNOSIS — A0472 Enterocolitis due to Clostridium difficile, not specified as recurrent: Secondary | ICD-10-CM | POA: Diagnosis present

## 2019-09-26 DIAGNOSIS — J1282 Pneumonia due to coronavirus disease 2019: Secondary | ICD-10-CM | POA: Diagnosis present

## 2019-09-26 DIAGNOSIS — Z992 Dependence on renal dialysis: Secondary | ICD-10-CM | POA: Diagnosis not present

## 2019-09-26 DIAGNOSIS — Z66 Do not resuscitate: Secondary | ICD-10-CM | POA: Diagnosis present

## 2019-09-26 DIAGNOSIS — N189 Chronic kidney disease, unspecified: Secondary | ICD-10-CM

## 2019-09-26 DIAGNOSIS — Z7189 Other specified counseling: Secondary | ICD-10-CM | POA: Diagnosis not present

## 2019-09-26 DIAGNOSIS — N186 End stage renal disease: Secondary | ICD-10-CM | POA: Diagnosis present

## 2019-09-26 DIAGNOSIS — B181 Chronic viral hepatitis B without delta-agent: Secondary | ICD-10-CM | POA: Diagnosis present

## 2019-09-26 DIAGNOSIS — K7460 Unspecified cirrhosis of liver: Secondary | ICD-10-CM | POA: Diagnosis not present

## 2019-09-26 DIAGNOSIS — K219 Gastro-esophageal reflux disease without esophagitis: Secondary | ICD-10-CM | POA: Diagnosis present

## 2019-09-26 DIAGNOSIS — R188 Other ascites: Secondary | ICD-10-CM | POA: Diagnosis present

## 2019-09-26 DIAGNOSIS — I132 Hypertensive heart and chronic kidney disease with heart failure and with stage 5 chronic kidney disease, or end stage renal disease: Secondary | ICD-10-CM | POA: Diagnosis present

## 2019-09-26 DIAGNOSIS — I480 Paroxysmal atrial fibrillation: Secondary | ICD-10-CM | POA: Diagnosis present

## 2019-09-26 DIAGNOSIS — E875 Hyperkalemia: Secondary | ICD-10-CM

## 2019-09-26 DIAGNOSIS — E162 Hypoglycemia, unspecified: Secondary | ICD-10-CM | POA: Diagnosis present

## 2019-09-26 DIAGNOSIS — D6869 Other thrombophilia: Secondary | ICD-10-CM | POA: Diagnosis present

## 2019-09-26 DIAGNOSIS — F419 Anxiety disorder, unspecified: Secondary | ICD-10-CM | POA: Diagnosis present

## 2019-09-26 DIAGNOSIS — J440 Chronic obstructive pulmonary disease with acute lower respiratory infection: Secondary | ICD-10-CM | POA: Diagnosis present

## 2019-09-26 DIAGNOSIS — N185 Chronic kidney disease, stage 5: Secondary | ICD-10-CM | POA: Diagnosis not present

## 2019-09-26 DIAGNOSIS — N19 Unspecified kidney failure: Secondary | ICD-10-CM | POA: Diagnosis present

## 2019-09-26 DIAGNOSIS — I50812 Chronic right heart failure: Secondary | ICD-10-CM | POA: Diagnosis present

## 2019-09-26 DIAGNOSIS — R6521 Severe sepsis with septic shock: Secondary | ICD-10-CM | POA: Diagnosis present

## 2019-09-26 DIAGNOSIS — B1910 Unspecified viral hepatitis B without hepatic coma: Secondary | ICD-10-CM | POA: Diagnosis not present

## 2019-09-26 DIAGNOSIS — Z515 Encounter for palliative care: Secondary | ICD-10-CM | POA: Diagnosis present

## 2019-09-26 LAB — POCT I-STAT 7, (LYTES, BLD GAS, ICA,H+H)
Acid-Base Excess: 3 mmol/L — ABNORMAL HIGH (ref 0.0–2.0)
Acid-Base Excess: 3 mmol/L — ABNORMAL HIGH (ref 0.0–2.0)
Acid-Base Excess: 8 mmol/L — ABNORMAL HIGH (ref 0.0–2.0)
Acid-base deficit: 2 mmol/L (ref 0.0–2.0)
Bicarbonate: 21.7 mmol/L (ref 20.0–28.0)
Bicarbonate: 26.4 mmol/L (ref 20.0–28.0)
Bicarbonate: 27.5 mmol/L (ref 20.0–28.0)
Bicarbonate: 32.2 mmol/L — ABNORMAL HIGH (ref 20.0–28.0)
Calcium, Ion: 1.14 mmol/L — ABNORMAL LOW (ref 1.15–1.40)
Calcium, Ion: 1.15 mmol/L (ref 1.15–1.40)
Calcium, Ion: 1.16 mmol/L (ref 1.15–1.40)
Calcium, Ion: 1.19 mmol/L (ref 1.15–1.40)
HCT: 27 % — ABNORMAL LOW (ref 39.0–52.0)
HCT: 28 % — ABNORMAL LOW (ref 39.0–52.0)
HCT: 30 % — ABNORMAL LOW (ref 39.0–52.0)
HCT: 30 % — ABNORMAL LOW (ref 39.0–52.0)
Hemoglobin: 10.2 g/dL — ABNORMAL LOW (ref 13.0–17.0)
Hemoglobin: 10.2 g/dL — ABNORMAL LOW (ref 13.0–17.0)
Hemoglobin: 9.2 g/dL — ABNORMAL LOW (ref 13.0–17.0)
Hemoglobin: 9.5 g/dL — ABNORMAL LOW (ref 13.0–17.0)
O2 Saturation: 100 %
O2 Saturation: 100 %
O2 Saturation: 94 %
O2 Saturation: 99 %
Patient temperature: 97.8
Patient temperature: 98.6
Potassium: 4 mmol/L (ref 3.5–5.1)
Potassium: 6.7 mmol/L (ref 3.5–5.1)
Potassium: 7.1 mmol/L (ref 3.5–5.1)
Potassium: 7.3 mmol/L (ref 3.5–5.1)
Sodium: 130 mmol/L — ABNORMAL LOW (ref 135–145)
Sodium: 131 mmol/L — ABNORMAL LOW (ref 135–145)
Sodium: 131 mmol/L — ABNORMAL LOW (ref 135–145)
Sodium: 138 mmol/L (ref 135–145)
TCO2: 23 mmol/L (ref 22–32)
TCO2: 27 mmol/L (ref 22–32)
TCO2: 29 mmol/L (ref 22–32)
TCO2: 33 mmol/L — ABNORMAL HIGH (ref 22–32)
pCO2 arterial: 31.9 mmHg — ABNORMAL LOW (ref 32.0–48.0)
pCO2 arterial: 34.3 mmHg (ref 32.0–48.0)
pCO2 arterial: 40.7 mmHg (ref 32.0–48.0)
pCO2 arterial: 42.1 mmHg (ref 32.0–48.0)
pH, Arterial: 7.435 (ref 7.350–7.450)
pH, Arterial: 7.441 (ref 7.350–7.450)
pH, Arterial: 7.49 — ABNORMAL HIGH (ref 7.350–7.450)
pH, Arterial: 7.495 — ABNORMAL HIGH (ref 7.350–7.450)
pO2, Arterial: 136 mmHg — ABNORMAL HIGH (ref 83.0–108.0)
pO2, Arterial: 175 mmHg — ABNORMAL HIGH (ref 83.0–108.0)
pO2, Arterial: 283 mmHg — ABNORMAL HIGH (ref 83.0–108.0)
pO2, Arterial: 63 mmHg — ABNORMAL LOW (ref 83.0–108.0)

## 2019-09-26 LAB — CBC
HCT: 31.1 % — ABNORMAL LOW (ref 39.0–52.0)
HCT: 29.6 % — ABNORMAL LOW (ref 39.0–52.0)
Hemoglobin: 10.6 g/dL — ABNORMAL LOW (ref 13.0–17.0)
Hemoglobin: 10.4 g/dL — ABNORMAL LOW (ref 13.0–17.0)
MCH: 26.7 pg (ref 26.0–34.0)
MCH: 26.6 pg (ref 26.0–34.0)
MCHC: 34.1 g/dL (ref 30.0–36.0)
MCHC: 35.1 g/dL (ref 30.0–36.0)
MCV: 78.3 fL — ABNORMAL LOW (ref 80.0–100.0)
MCV: 75.7 fL — ABNORMAL LOW (ref 80.0–100.0)
Platelets: 131 10*3/uL — ABNORMAL LOW (ref 150–400)
Platelets: 122 10*3/uL — ABNORMAL LOW (ref 150–400)
RBC: 3.97 MIL/uL — ABNORMAL LOW (ref 4.22–5.81)
RBC: 3.91 MIL/uL — ABNORMAL LOW (ref 4.22–5.81)
RDW: 15.1 % (ref 11.5–15.5)
RDW: 14.6 % (ref 11.5–15.5)
WBC: 6.8 10*3/uL (ref 4.0–10.5)
WBC: 5.6 10*3/uL (ref 4.0–10.5)
nRBC: 0 % (ref 0.0–0.2)
nRBC: 0 % (ref 0.0–0.2)

## 2019-09-26 LAB — RESPIRATORY PANEL BY RT PCR (FLU A&B, COVID)
Influenza A by PCR: NEGATIVE
Influenza B by PCR: NEGATIVE
SARS Coronavirus 2 by RT PCR: POSITIVE — AB

## 2019-09-26 LAB — CREATININE, SERUM
Creatinine, Ser: 11.04 mg/dL — ABNORMAL HIGH (ref 0.61–1.24)
GFR calc Af Amer: 5 mL/min — ABNORMAL LOW (ref >60)
GFR calc non Af Amer: 5 mL/min — ABNORMAL LOW (ref >60)

## 2019-09-26 LAB — RENAL FUNCTION PANEL
Albumin: 2.8 g/dL — ABNORMAL LOW (ref 3.5–5.0)
Anion gap: 18 — ABNORMAL HIGH (ref 5–15)
BUN: 76 mg/dL — ABNORMAL HIGH (ref 6–20)
CO2: 23 mmol/L (ref 22–32)
Calcium: 9.1 mg/dL (ref 8.9–10.3)
Chloride: 94 mmol/L — ABNORMAL LOW (ref 98–111)
Creatinine, Ser: 10.97 mg/dL — ABNORMAL HIGH (ref 0.61–1.24)
GFR calc Af Amer: 5 mL/min — ABNORMAL LOW (ref >60)
GFR calc non Af Amer: 5 mL/min — ABNORMAL LOW (ref >60)
Glucose, Bld: 90 mg/dL (ref 70–99)
Phosphorus: 5.9 mg/dL — ABNORMAL HIGH (ref 2.5–4.6)
Potassium: 7.3 mmol/L (ref 3.5–5.1)
Sodium: 135 mmol/L (ref 135–145)

## 2019-09-26 LAB — GLUCOSE, CAPILLARY
Glucose-Capillary: 97 mg/dL (ref 70–99)
Glucose-Capillary: 81 mg/dL (ref 70–99)
Glucose-Capillary: 111 mg/dL — ABNORMAL HIGH (ref 70–99)
Glucose-Capillary: 129 mg/dL — ABNORMAL HIGH (ref 70–99)
Glucose-Capillary: 122 mg/dL — ABNORMAL HIGH (ref 70–99)
Glucose-Capillary: 118 mg/dL — ABNORMAL HIGH (ref 70–99)
Glucose-Capillary: 125 mg/dL — ABNORMAL HIGH (ref 70–99)

## 2019-09-26 LAB — BASIC METABOLIC PANEL
Anion gap: 17 — ABNORMAL HIGH (ref 5–15)
BUN: 49 mg/dL — ABNORMAL HIGH (ref 6–20)
CO2: 24 mmol/L (ref 22–32)
Calcium: 8.6 mg/dL — ABNORMAL LOW (ref 8.9–10.3)
Chloride: 91 mmol/L — ABNORMAL LOW (ref 98–111)
Creatinine, Ser: 8.06 mg/dL — ABNORMAL HIGH (ref 0.61–1.24)
GFR calc Af Amer: 8 mL/min — ABNORMAL LOW (ref >60)
GFR calc non Af Amer: 7 mL/min — ABNORMAL LOW (ref >60)
Glucose, Bld: 117 mg/dL — ABNORMAL HIGH (ref 70–99)
Potassium: 5.7 mmol/L — ABNORMAL HIGH (ref 3.5–5.1)
Sodium: 132 mmol/L — ABNORMAL LOW (ref 135–145)

## 2019-09-26 LAB — TROPONIN I (HIGH SENSITIVITY)
Troponin I (High Sensitivity): 95 ng/L — ABNORMAL HIGH (ref <18)
Troponin I (High Sensitivity): 87 ng/L — ABNORMAL HIGH (ref <18)

## 2019-09-26 LAB — HIV ANTIBODY (ROUTINE TESTING W REFLEX): HIV Screen 4th Generation wRfx: NONREACTIVE

## 2019-09-26 LAB — D-DIMER, QUANTITATIVE: D-Dimer, Quant: 11.34 ug/mL-FEU — ABNORMAL HIGH (ref 0.00–0.50)

## 2019-09-26 LAB — CBG MONITORING, ED
Glucose-Capillary: 101 mg/dL — ABNORMAL HIGH (ref 70–99)
Glucose-Capillary: 74 mg/dL (ref 70–99)
Glucose-Capillary: 77 mg/dL (ref 70–99)
Glucose-Capillary: 88 mg/dL (ref 70–99)

## 2019-09-26 LAB — C-REACTIVE PROTEIN: CRP: 9.1 mg/dL — ABNORMAL HIGH (ref <1.0)

## 2019-09-26 LAB — POTASSIUM
Potassium: 7.1 mmol/L (ref 3.5–5.1)
Potassium: 5.9 mmol/L — ABNORMAL HIGH (ref 3.5–5.1)

## 2019-09-26 LAB — HEMOGLOBIN A1C
Hgb A1c MFr Bld: 4.7 % — ABNORMAL LOW (ref 4.8–5.6)
Mean Plasma Glucose: 88.19 mg/dL

## 2019-09-26 LAB — C DIFFICILE QUICK SCREEN W PCR REFLEX
C Diff antigen: POSITIVE — AB
C Diff toxin: NEGATIVE

## 2019-09-26 LAB — MAGNESIUM: Magnesium: 1.8 mg/dL (ref 1.7–2.4)

## 2019-09-26 LAB — CLOSTRIDIUM DIFFICILE BY PCR, REFLEXED: Toxigenic C. Difficile by PCR: POSITIVE — AB

## 2019-09-26 LAB — FIBRINOGEN: Fibrinogen: 435 mg/dL (ref 210–475)

## 2019-09-26 LAB — MRSA PCR SCREENING: MRSA by PCR: NEGATIVE

## 2019-09-26 LAB — AMMONIA: Ammonia: 31 umol/L (ref 9–35)

## 2019-09-26 MED ORDER — DEXAMETHASONE SODIUM PHOSPHATE 10 MG/ML IJ SOLN
6.0000 mg | Freq: Every day | INTRAMUSCULAR | Status: DC
  Administered 2019-09-26 – 2019-09-29 (×4): 6 mg via INTRAVENOUS
  Filled 2019-09-26 (×5): qty 1

## 2019-09-26 MED ORDER — ROCURONIUM BROMIDE 50 MG/5ML IV SOLN
100.0000 mg | Freq: Once | INTRAVENOUS | Status: AC
  Administered 2019-09-25: 100 mg via INTRAVENOUS

## 2019-09-26 MED ORDER — METOPROLOL TARTRATE 5 MG/5ML IV SOLN: 2.5000 mg | Freq: Four times a day (QID) | INTRAVENOUS | Status: DC | PRN

## 2019-09-26 MED ORDER — SODIUM CHLORIDE 0.9 % FOR CRRT: INTRAVENOUS_CENTRAL | Status: DC | PRN

## 2019-09-26 MED ORDER — FENTANYL CITRATE (PF) 100 MCG/2ML IJ SOLN
INTRAMUSCULAR | Status: AC
  Administered 2019-09-26: 100 ug
  Filled 2019-09-26: qty 2

## 2019-09-26 MED ORDER — DEXTROSE 50 % IV SOLN
25.0000 mL | Freq: Once | INTRAVENOUS | Status: AC
  Administered 2019-09-26: 25 mL via INTRAVENOUS

## 2019-09-26 MED ORDER — SODIUM CHLORIDE 0.9 % IV SOLN: 100.0000 mL | INTRAVENOUS | Status: DC | PRN

## 2019-09-26 MED ORDER — FENTANYL 2500MCG IN NS 250ML (10MCG/ML) PREMIX INFUSION
0.0000 ug/h | INTRAVENOUS | Status: DC
  Administered 2019-09-26: 50 ug/h via INTRAVENOUS
  Administered 2019-09-26 – 2019-09-27 (×2): 275 ug/h via INTRAVENOUS
  Administered 2019-09-27 (×2): 225 ug/h via INTRAVENOUS
  Administered 2019-09-28: 275 ug/h via INTRAVENOUS
  Filled 2019-09-26 (×6): qty 250

## 2019-09-26 MED ORDER — HEPARIN SODIUM (PORCINE) 1000 UNIT/ML DIALYSIS: 1000.0000 [IU] | INTRAMUSCULAR | Status: DC | PRN

## 2019-09-26 MED ORDER — ETOMIDATE 2 MG/ML IV SOLN
20.0000 mg | Freq: Once | INTRAVENOUS | Status: AC
  Administered 2019-09-25: 20 mg via INTRAVENOUS

## 2019-09-26 MED ORDER — PENTAFLUOROPROP-TETRAFLUOROETH EX AERO: 1.0000 "application " | INHALATION_SPRAY | CUTANEOUS | Status: DC | PRN

## 2019-09-26 MED ORDER — NOREPINEPHRINE 4 MG/250ML-% IV SOLN
0.0000 ug/min | INTRAVENOUS | Status: DC
  Administered 2019-09-26: 2 ug/min via INTRAVENOUS
  Administered 2019-09-27: 3 ug/min via INTRAVENOUS
  Filled 2019-09-26 (×2): qty 250

## 2019-09-26 MED ORDER — INSULIN ASPART 100 UNIT/ML ~~LOC~~ SOLN: 0.0000 [IU] | SUBCUTANEOUS | Status: DC

## 2019-09-26 MED ORDER — ACETAMINOPHEN 325 MG PO TABS
650.0000 mg | ORAL_TABLET | ORAL | Status: DC | PRN
  Administered 2019-09-28 – 2019-10-01 (×3): 650 mg via ORAL
  Filled 2019-09-26 (×4): qty 2

## 2019-09-26 MED ORDER — CHLORHEXIDINE GLUCONATE 0.12% ORAL RINSE (MEDLINE KIT)
15.0000 mL | Freq: Two times a day (BID) | OROMUCOSAL | Status: DC
  Administered 2019-09-26 – 2019-09-28 (×5): 15 mL via OROMUCOSAL

## 2019-09-26 MED ORDER — LIDOCAINE-PRILOCAINE 2.5-2.5 % EX CREA: 1.0000 "application " | TOPICAL_CREAM | CUTANEOUS | Status: DC | PRN

## 2019-09-26 MED ORDER — ORAL CARE MOUTH RINSE
15.0000 mL | OROMUCOSAL | Status: DC
  Administered 2019-09-26 – 2019-09-28 (×22): 15 mL via OROMUCOSAL

## 2019-09-26 MED ORDER — ALTEPLASE 2 MG IJ SOLR: 2.0000 mg | Freq: Once | INTRAMUSCULAR | Status: DC | PRN

## 2019-09-26 MED ORDER — PANTOPRAZOLE SODIUM 40 MG IV SOLR
40.0000 mg | Freq: Every day | INTRAVENOUS | Status: DC
  Administered 2019-09-26 – 2019-09-29 (×4): 40 mg via INTRAVENOUS
  Filled 2019-09-26 (×4): qty 40

## 2019-09-26 MED ORDER — CHLORHEXIDINE GLUCONATE 0.12% ORAL RINSE (MEDLINE KIT)
15.0000 mL | Freq: Two times a day (BID) | OROMUCOSAL | Status: DC
  Administered 2019-09-26: 15 mL via OROMUCOSAL

## 2019-09-26 MED ORDER — SODIUM CHLORIDE 0.9 % IV SOLN: 250.0000 mL | INTRAVENOUS | Status: DC

## 2019-09-26 MED ORDER — FENTANYL BOLUS VIA INFUSION
50.0000 ug | INTRAVENOUS | Status: DC | PRN
  Administered 2019-09-26: 50 ug via INTRAVENOUS
  Filled 2019-09-26: qty 50

## 2019-09-26 MED ORDER — PRISMASOL BGK 0/2.5 32-2.5 MEQ/L IV SOLN
INTRAVENOUS | Status: DC
  Filled 2019-09-26 (×6): qty 5000

## 2019-09-26 MED ORDER — PRISMASOL BGK 0/2.5 32-2.5 MEQ/L REPLACEMENT SOLN
Status: DC
  Filled 2019-09-26: qty 5000

## 2019-09-26 MED ORDER — SODIUM ZIRCONIUM CYCLOSILICATE 10 G PO PACK: 10.0000 g | PACK | Freq: Once | ORAL | Status: DC

## 2019-09-26 MED ORDER — ALBUTEROL SULFATE (2.5 MG/3ML) 0.083% IN NEBU: 2.5000 mg | INHALATION_SOLUTION | RESPIRATORY_TRACT | Status: DC | PRN

## 2019-09-26 MED ORDER — VANCOMYCIN 50 MG/ML ORAL SOLUTION
125.0000 mg | Freq: Four times a day (QID) | ORAL | Status: DC
  Administered 2019-09-26 – 2019-10-01 (×18): 125 mg via ORAL
  Filled 2019-09-26 (×25): qty 2.5

## 2019-09-26 MED ORDER — HEPARIN SODIUM (PORCINE) 5000 UNIT/ML IJ SOLN: 5000.0000 [IU] | Freq: Three times a day (TID) | INTRAMUSCULAR | Status: DC

## 2019-09-26 MED ORDER — DEXTROSE 50 % IV SOLN
INTRAVENOUS | Status: AC
  Filled 2019-09-26: qty 50

## 2019-09-26 MED ORDER — SODIUM CHLORIDE 0.9 % IV SOLN
100.0000 mg | INTRAVENOUS | Status: AC
  Administered 2019-09-26 (×2): 100 mg via INTRAVENOUS
  Filled 2019-09-26 (×2): qty 20

## 2019-09-26 MED ORDER — SODIUM CHLORIDE 0.9 % IV SOLN
100.0000 mg | Freq: Every day | INTRAVENOUS | Status: AC
  Administered 2019-09-27 – 2019-09-30 (×4): 100 mg via INTRAVENOUS
  Filled 2019-09-26 (×4): qty 20

## 2019-09-26 MED ORDER — HEPARIN SODIUM (PORCINE) 5000 UNIT/ML IJ SOLN
7500.0000 [IU] | Freq: Three times a day (TID) | INTRAMUSCULAR | Status: DC
  Administered 2019-09-26 – 2019-09-30 (×13): 7500 [IU] via SUBCUTANEOUS
  Filled 2019-09-26 (×14): qty 2

## 2019-09-26 MED ORDER — LIP MEDEX EX OINT
TOPICAL_OINTMENT | CUTANEOUS | Status: DC | PRN
  Filled 2019-09-26: qty 7

## 2019-09-26 MED ORDER — IPRATROPIUM-ALBUTEROL 0.5-2.5 (3) MG/3ML IN SOLN
3.0000 mL | RESPIRATORY_TRACT | Status: DC
  Administered 2019-09-26 – 2019-09-28 (×13): 3 mL via RESPIRATORY_TRACT
  Filled 2019-09-26 (×13): qty 3

## 2019-09-26 MED ORDER — DEXTROSE 10 % IV SOLN
INTRAVENOUS | Status: DC
  Filled 2019-09-26 (×3): qty 1000

## 2019-09-26 MED ORDER — ORAL CARE MOUTH RINSE
15.0000 mL | OROMUCOSAL | Status: DC
  Administered 2019-09-26 (×5): 15 mL via OROMUCOSAL

## 2019-09-26 MED ORDER — PRISMASOL BGK 0/2.5 32-2.5 MEQ/L REPLACEMENT SOLN
Status: DC
  Filled 2019-09-26 (×2): qty 5000

## 2019-09-26 MED ORDER — LIDOCAINE HCL (PF) 1 % IJ SOLN: 5.0000 mL | INTRAMUSCULAR | Status: DC | PRN

## 2019-09-26 NOTE — Progress Notes (Signed)
Pt came from ED w/ 1 winter jacket gloves, cell phone, phone charging cord, socks, slippers, sweat pants, and a cut t-shirt. Belongings placed in 2 pt belongings bags with Allegra Grana.

## 2019-09-26 NOTE — ED Notes (Signed)
Report given to [place], RN on 85M. All questions answered

## 2019-09-26 NOTE — ED Notes (Signed)
Phlebotomy at bedside MD Duwayne Heck with critical care notified of CBG 77 and covid positive results

## 2019-09-26 NOTE — ED Notes (Signed)
istat blood gas given to critical care MD and NP

## 2019-09-26 NOTE — ED Notes (Signed)
Pt transported to 3M01 on continuous monitors with RT and this RN with all belongings

## 2019-09-26 NOTE — H&P (Signed)
NAME:  [name], MRN:  [id number], DOB:  [birth year], LOS: 0 ADMISSION DATE:  09/25/2019, CONSULTATION DATE:  09/26/2019 REFERRING MD:  Dr. Sabra Heck, CHIEF COMPLAINT:  SOB/ hyperkalemia  Brief History   59 year old male with extensive history presenting with SOB and not feeling well with recent non-compliance in his dialysis found to be in respiratory distress, hyperkalemic with wide complex rhythm.  Required intubation in ER after being obtunded and ongoing respiratory distress.  Temporizing measures completed.  Nephrology consulted and PCCM to admit.  Noted to be positive on COVID 19 screening.   History of present illness   HPI obtained from medical chart review as patient is intubated and sedated on mechanical ventilation.   59 year old male with prior history of ESRD on MWF iHD, Afib, CAD, ongoing tobacco abuse, COPD, pulmonary HTN, chronic cor pulmonale, CAD s/p RCA stent 2018, severe mitral stenosis, HTN, chronic hep B/ C, cirrhosis with recurrent ascites requiring serial paracentesis, anemia, hx of cocaine and marijuana abuse, presenting to ER with complaints of SOB and not feeling well with generalized pain.   Of note he was recently underwent paracentesis 2/9 with 2L removed and again seen in ER on 3/14 for complaints shortness of breath and abdominal pain underwent paracentesis with 4.2L removed and noted to be hyperkalemic at 6.2, given lokelmia and discharged home with plans to have dialysis Monday morning.   However he did not go because he was feeling bad.  He additionally missed entire week of dialysis two weeks ago and last week had two short treatments.  He is followed by Palliative care on outpatient basis and previously documented DNR in Epic.     Noted with EMS to have peaked twaves with wide complex.  On arrival to ER, patient with ongoing wide complex tachycardia.  Labs ntoed for K >7.5, Na 132, CL 92, bicarb 21, BUN/ sCr 72/ 10.76, AG 19, alk phos 131, low albumin and  protein levels, trop hs 95, Hgb 10.5, normal coags, CXR consistent with developing pulmonary edema and not rule out developing infiltrate.  Treated with temporizing measures for hyperkalemia with repeat K 6.7 however complicated by ongoing hypoglycemia.  Nephrology consulted with plans for emergent iHD via patients RUE AVF.  Cardiology also consulted with recommendations for symptomatic care and palliative care given patients history of non compliance.  In ER, his mental status continued to decline with ongoing respiratory distress requiring intubation in ER.  PCCM called for admission.  SARS 2 pending.   Past Medical History  ESRD on MWF iHD, Afib, CAD, ongoing tobacco abuse, COPD, pulmonary HTN, chronic cor pulmonale, CAD s/p RCA stent 2018, severe mitral stenosis, HTN, chronic hep B/ C, cirrhosis, anemia, hx of cocaine and marijuana abuse   Significant Hospital Events   3/17 Admitted/ intubated  Consults:  Nephrology  Cardiology - [place]   Procedures:  3/16 ETT >>  Significant Diagnostic Tests:   Micro Data:  3/16 SARS 2/ Flu A/B >> neg  Antimicrobials:  N/a  3/17 remdesivir >> 3/17 decadron >>  Interim history/subjective:    Objective   Blood pressure (!) 187/105, pulse (!) 136, resp. rate 16, SpO2 99 %.    Vent Mode: PRVC FiO2 (%):  [60 %] 60 % Set Rate:  [18 bmp] 18 bmp Vt Set:  [600 mL] 600 mL PEEP:  [5 cmH20] 5 cmH20 Plateau Pressure:  [16 cmH20] 16 cmH20  No intake or output data in the 24 hours ending 09/26/19 0025 There  were no vitals filed for this visit.  Examination:  Seen with airborne and contact precautions General:  Chronically ill thin male sedated on MV HEENT: MM pink/dry, poor dentition, ETT/ OGT Neuro: sedated CV: wide complex tachycardia- 140s, RUE AVF +b/t PULM:  MV supported breaths, synchronous, diffuse rales with diffuse insp wheeze GI: protuberant but soft, +bs  Extremities: warm/dry, no LE edema, muscle atrophy  Skin: no rashes  Resolved  Hospital Problem list    Assessment & Plan:   Hyperkalemia in the setting of non compliance with dialysis  ESRD - dialysis dependent MWF P:  S/p temporizing measures in ER BP can tolerate iHD currently, will hopefully expedite bed placement to start iHD as this can only be done in ICU  Post HD labs     Acute hypoxic respiratory failure - possibly multifactorial given some pulmonary edema +/- possible developing infiltrate COVID 19 positive  COPD  P:  Full MV support, PRVC, rate 16 Target TVol 6-8cc/kgIBW Target Plateau Pressure < 30cm H20 Target driving pressure less than 15 cm of water Target PaO2 55-65: titrate PEEP/ FiO2 per protocol Ventilator associated pneumonia prevention protocol Volume removal per iHD Send inflammatory markers Decadron 59m daily for 10 days and remdesivir 5 days per pharmacy  duonebs q4 and albuterol prn  Continue airborne/ contact precautions  Repeat CXR am, prn ABG  Hold on empiric abx/ cultures for now, no clear evidence of infectious source/ normal WBC   Hypoglycemia  - s/p temporizing measures P:  D10 at 10 ml hr CBG q 15 till stable then trend    Hx HTN, pulmonary HTN, chronic cor pulmonale, severe MS, and CAD - despite his chronic RV dysfunction, its surprising patient has been able to tolerate iHD so far P:  Hold home meds for now    Wide complex tachycardia/ Afib (not on chronic anticoagulation) - LBBB at baseline but complex is significantly wider most likely related to hyperkalemia/ metabolic derangements  P:  Tele monitor  Check mag level  iHD to correct metabolic derangements  Holding home diltiazem for now    Chronic hep B/ C Cirrhosis  Recurrent ascites- last paracentesis 08/21/19 and 09/23/19 -[facility], patient was not a candidate for liver transplant, as he would require liver and kidney but not a candidate for transplant, TiPS or [place] shunt given his chronic right heart failure.   Recurrent ascites thought related to his right heart failure given elevated total fluid protein level rather than true hepatic dysfunction P:  Paracentesis as needed, abd soft currently No clear evidence for SBP  Check ammonia level    Protein calorie malnutrition  P:  Maximize nutrition when able    Chronic anemia  P:  Trend CBC   Best practice:  Diet: NPO Pain/Anxiety/Delirium protocol (if indicated): propofol/ prn fentanyl  VAP protocol (if indicated): yes DVT prophylaxis: heparin SQ/ SCDs GI prophylaxis: PPI Glucose control: D10 gtt,  Mobility: BR Code Status: DNR after speaking with sister Family Communication: Sister,[name] updated by Dr. GDuwayne Heckby phone Disposition: ICU   Labs   CBC: Recent Labs  Lab 09/23/19 2140 09/25/19 2015 09/25/19 2026 09/26/19 0001  WBC 5.7 6.1  --   --   NEUTROABS 4.3 4.7  --   --   HGB 9.6* 10.5* 10.5* 9.2*  HCT 27.9* 31.0* 31.0* 27.0*  MCV 77.9* 78.1*  --   --   PLT 145* 126*  --   --     Basic Metabolic  Panel: Recent Labs  Lab 09/23/19 2140 09/25/19 2015 09/25/19 2026 09/25/19 2252 09/26/19 0001  NA 135 132* 129*  --  131*  K 6.2* >7.5* 7.8* 7.1* 6.7*  CL 96* 92* 98  --   --   CO2 26 21*  --   --   --   GLUCOSE 110* 73 65*  --   --   BUN 49* 72* 63*  --   --   CREATININE 7.96* 10.76* 10.90*  --   --   CALCIUM 9.8 9.7  --   --   --    GFR: Estimated Creatinine Clearance: 6.7 mL/min (A) (by C-G formula based on SCr of 10.9 mg/dL (H)). Recent Labs  Lab 09/23/19 2140 09/25/19 2015  WBC 5.7 6.1    Liver Function Tests: Recent Labs  Lab 09/23/19 2140 09/25/19 2015  AST 48* 41  ALT 23 19  ALKPHOS 122 131*  BILITOT 0.4 0.8  PROT 6.6 6.4*  ALBUMIN 3.4* 3.2*   No results for input(s): LIPASE, AMYLASE in the last 168 hours. No results for input(s): AMMONIA in the last 168 hours.  ABG    Component Value Date/Time   PHART 7.495 (H) 09/26/2019 0001   PCO2ART 34.3 09/26/2019 0001   PO2ART 63.0 (L)  09/26/2019 0001   HCO3 26.4 09/26/2019 0001   TCO2 27 09/26/2019 0001   ACIDBASEDEF 2.0 07/11/2017 0730   O2SAT 94.0 09/26/2019 0001     Coagulation Profile: Recent Labs  Lab 09/25/19 2337  INR 1.1    Cardiac Enzymes: No results for input(s): CKTOTAL, CKMB, CKMBINDEX, TROPONINI in the last 168 hours.  HbA1C: Hgb A1c MFr Bld  Date/Time Value Ref Range Status  04/24/2017 03:18 PM 4.6 (L) 4.8 - 5.6 % Final    Comment:    (NOTE) Pre diabetes:          5.7%-6.4% Diabetes:              >6.4% Glycemic control for   <7.0% adults with diabetes   08/17/2016 01:31 AM 4.9 4.8 - 5.6 % Final    Comment:    (NOTE)         Pre-diabetes: 5.7 - 6.4         Diabetes: >6.4         Glycemic control for adults with diabetes: <7.0     CBG: Recent Labs  Lab 09/25/19 2307 09/25/19 2312 09/25/19 2327 09/25/19 2354  GLUCAP 17* 202* 100* 142*    Review of Systems:   Unable   Past Medical History  He,  has a past medical history of Anemia, Anxiety, Arthritis, Atherosclerosis of aorta ([place]), Cardiomegaly, Chest pain, Cocaine abuse ([place]), COPD exacerbation ([place]) (08/17/2016), Coronary artery disease, ESRD (end stage renal disease) on dialysis ([place]), GERD (gastroesophageal reflux disease), Hemorrhoids, Hepatitis B, chronic ([place]), Hepatitis C, History of kidney stones, Hyperkalemia, Hypertension, Kidney failure, Metabolic bone disease, Mitral stenosis, Myocardial infarction ([place]), Pneumonia, Pulmonary edema, Solitary rectal ulcer syndrome (07/2017), and Tubular adenoma of colon.   Surgical History    Past Surgical History:  Procedure Laterality Date  . A/V FISTULAGRAM Left 05/26/2017   Procedure: A/V FISTULAGRAM;  Surgeon: Conrad Cruger, MD;  Location: [place] [facility];  Service: Cardiovascular;  Laterality: Left;  . A/V FISTULAGRAM Right 11/18/2017   Procedure: A/V FISTULAGRAM - Right Arm;  Surgeon: Elam Dutch, MD;  Location: [place] [facility];  Service: Cardiovascular;   Laterality: Right;  . APPLICATION OF WOUND VAC Left 06/14/2017  Procedure: APPLICATION OF WOUND VAC;  Surgeon: Katha Cabal, MD;  Location: [facility];  Service: Vascular;  Laterality: Left;  . AV FISTULA PLACEMENT  2012   BELIEVED WAS PLACED IN JUNE  . AV FISTULA PLACEMENT Right 08/09/2017   Procedure: Creation Right arm ARTERIOVENOUS BRACHIOCEPOHALIC FISTULA;  Surgeon: Elam Dutch, MD;  Location: [facility];  Service: Vascular;  Laterality: Right;  . AV FISTULA PLACEMENT Right 11/22/2017   Procedure: INSERTION OF ARTERIOVENOUS (AV) GORE-TEX GRAFT RIGHT UPPER ARM;  Surgeon: Elam Dutch, MD;  Location: [place];  Service: Vascular;  Laterality: Right;  . BIOPSY  01/25/2018   Procedure: BIOPSY;  Surgeon: Jerene Bears, MD;  Location: [place];  Service: Gastroenterology;;  . BIOPSY  04/10/2019   Procedure: BIOPSY;  Surgeon: Jerene Bears, MD;  Location: [facility];  Service: Gastroenterology;;  . COLONOSCOPY    . COLONOSCOPY WITH PROPOFOL N/A 01/25/2018   Procedure: COLONOSCOPY WITH PROPOFOL;  Surgeon: Jerene Bears, MD;  Location: [place];  Service: Gastroenterology;  Laterality: N/A;  . CORONARY STENT INTERVENTION N/A 02/22/2017   Procedure: CORONARY STENT INTERVENTION;  Surgeon: Nigel Mormon, MD;  Location: [place] [facility];  Service: Cardiovascular;  Laterality: N/A;  . ESOPHAGOGASTRODUODENOSCOPY (EGD) WITH PROPOFOL N/A 01/25/2018   Procedure: ESOPHAGOGASTRODUODENOSCOPY (EGD) WITH PROPOFOL;  Surgeon: Jerene Bears, MD;  Location: [place];  Service: Gastroenterology;  Laterality: N/A;  . ESOPHAGOGASTRODUODENOSCOPY (EGD) WITH PROPOFOL N/A 04/10/2019   Procedure: ESOPHAGOGASTRODUODENOSCOPY (EGD) WITH PROPOFOL;  Surgeon: Jerene Bears, MD;  Location: [facility];  Service: Gastroenterology;  Laterality: N/A;  . FLEXIBLE SIGMOIDOSCOPY N/A 07/15/2017   Procedure: FLEXIBLE SIGMOIDOSCOPY;  Surgeon: Carol Ada, MD;  Location: [place];  Service: Endoscopy;   Laterality: N/A;  . HEMORRHOID BANDING    . I & D EXTREMITY Left 06/01/2017   Procedure: IRRIGATION AND DEBRIDEMENT LEFT ARM HEMATOMA WITH LIGATION OF LEFT ARM AV FISTULA;  Surgeon: Elam Dutch, MD;  Location: [place];  Service: Vascular;  Laterality: Left;  . I & D EXTREMITY Left 06/14/2017   Procedure: IRRIGATION AND DEBRIDEMENT EXTREMITY;  Surgeon: Katha Cabal, MD;  Location: [facility];  Service: Vascular;  Laterality: Left;  . INSERTION OF DIALYSIS CATHETER  05/30/2017  . INSERTION OF DIALYSIS CATHETER N/A 05/30/2017   Procedure: INSERTION OF DIALYSIS CATHETER;  Surgeon: Elam Dutch, MD;  Location: [place];  Service: Vascular;  Laterality: N/A;  . IR PARACENTESIS  08/30/2017  . IR PARACENTESIS  09/29/2017  . IR PARACENTESIS  10/28/2017  . IR PARACENTESIS  11/09/2017  . IR PARACENTESIS  11/16/2017  . IR PARACENTESIS  11/28/2017  . IR PARACENTESIS  12/01/2017  . IR PARACENTESIS  12/06/2017  . IR PARACENTESIS  01/03/2018  . IR PARACENTESIS  01/23/2018  . IR PARACENTESIS  02/07/2018  . IR PARACENTESIS  02/21/2018  . IR PARACENTESIS  03/06/2018  . IR PARACENTESIS  03/17/2018  . IR PARACENTESIS  04/04/2018  . IR PARACENTESIS  12/28/2018  . IR PARACENTESIS  01/08/2019  . IR PARACENTESIS  01/23/2019  . IR PARACENTESIS  02/01/2019  . IR PARACENTESIS  02/19/2019  . IR PARACENTESIS  03/01/2019  . IR PARACENTESIS  03/15/2019  . IR PARACENTESIS  04/03/2019  . IR PARACENTESIS  04/12/2019  . IR PARACENTESIS  05/01/2019  . IR PARACENTESIS  05/08/2019  . IR PARACENTESIS  05/24/2019  . IR PARACENTESIS  06/12/2019  . IR PARACENTESIS  07/09/2019  . IR PARACENTESIS  07/27/2019  . IR PARACENTESIS  08/09/2019  . IR PARACENTESIS  08/21/2019  . IR PARACENTESIS  09/17/2019  . IR RADIOLOGIST EVAL & MGMT  02/14/2018  . IR RADIOLOGIST EVAL & MGMT  02/22/2019  . LEFT HEART CATH AND CORONARY ANGIOGRAPHY N/A 02/22/2017   Procedure: LEFT HEART CATH AND CORONARY ANGIOGRAPHY;  Surgeon: Nigel Mormon, MD;  Location: [place] [facility];  Service: Cardiovascular;  Laterality: N/A;  . LIGATION OF ARTERIOVENOUS  FISTULA Left 12/12/8464   Procedure: Plication of Left Arm Arteriovenous Fistula;  Surgeon: Elam Dutch, MD;  Location: [place];  Service: Vascular;  Laterality: Left;  . POLYPECTOMY    . POLYPECTOMY  01/25/2018   Procedure: POLYPECTOMY;  Surgeon: Jerene Bears, MD;  Location: [place];  Service: Gastroenterology;;  . REVISON OF ARTERIOVENOUS FISTULA Left 5/99/3570   Procedure: PLICATION OF DISTAL ANEURYSMAL SEGEMENT OF LEFT UPPER ARM ARTERIOVENOUS FISTULA;  Surgeon: Elam Dutch, MD;  Location: [place];  Service: Vascular;  Laterality: Left;  . REVISON OF ARTERIOVENOUS FISTULA Left 1/77/9390   Procedure: Plication of Left Upper Arm Fistula ;  Surgeon: Waynetta Sandy, MD;  Location: [place];  Service: Vascular;  Laterality: Left;  . SKIN GRAFT SPLIT THICKNESS LEG / FOOT Left    SKIN GRAFT SPLIT THICKNESS LEFT ARM DONOR SITE: LEFT ANTERIOR THIGH  . SKIN SPLIT GRAFT Left 07/04/2017   Procedure: SKIN GRAFT SPLIT THICKNESS LEFT ARM DONOR SITE: LEFT ANTERIOR THIGH;  Surgeon: Elam Dutch, MD;  Location: [place];  Service: Vascular;  Laterality: Left;  . THROMBECTOMY W/ EMBOLECTOMY Left 06/05/2017   Procedure: EXPLORATION OF LEFT ARM FOR BLEEDING; OVERSEWED PROXIMAL FISTULA;  Surgeon: Angelia Mould, MD;  Location: [place];  Service: Vascular;  Laterality: Left;  . WOUND EXPLORATION Left 06/03/2017   Procedure: WOUND EXPLORATION WITH WOUND VAC APPLICATION TO LEFT ARM;  Surgeon: Angelia Mould, MD;  Location: [place];  Service: Vascular;  Laterality: Left;     Social History   reports that he has been smoking cigarettes. He started smoking about 46 years ago. He has a 21.50 pack-year smoking history. He has never used smokeless tobacco. He reports previous alcohol use. He reports previous drug use. Drugs: Marijuana and Cocaine.   Family History   His family history includes COPD in  his father and another family member; Esophageal cancer in his sister; Heart disease in his father and mother; Hypertension in an other family member; Lung cancer in his mother; Malignant hyperthermia in his father; Throat cancer in his sister. There is no history of Colon cancer, Colon polyps, Rectal cancer, or Stomach cancer.   Allergies Allergies  Allergen Reactions  . Tramadol Itching and Other (See Comments)  . Morphine     Other reaction(s): Other (See Comments)  . Morphine And Related Other (See Comments)    Stomach pain  . Pollen Extract     Other reaction(s): Sneezing (finding)  . Acetaminophen Nausea Only    Stomach ache Other reaction(s): Other (See Comments) Stomach ache  . Aspirin Other (See Comments) and Itching    STOMACH PAIN Other reaction(s): Unknown STOMACH PAIN  . Clonidine Itching  . Clonidine Derivatives Itching     Home Medications  Prior to Admission medications   Medication Sig Start Date End Date Taking? Authorizing Provider  diltiazem (TIAZAC) 240 MG 24 hr capsule Take 240 mg by mouth daily. 08/16/19  Yes [provider]  lactulose (CHRONULAC) 10 GM/15ML solution Take 45 mLs by mouth. 03/13/19  Yes [provider]  albuterol (VENTOLIN HFA) 108 (90 Base) MCG/ACT inhaler Inhale 2 puffs into the lungs every 6 (six) hours as needed for wheezing or shortness of breath.    [provider]  hydrALAZINE (APRESOLINE) 100 MG tablet Take 100 mg by mouth 2 (two) times daily.    [provider]  hydrOXYzine (ATARAX/VISTARIL) 25 MG tablet Take 25 mg by mouth 2 (two) times daily as needed. 08/21/19   [provider]  loperamide (IMODIUM A-D) 2 MG tablet Take 2 mg by mouth 4 (four) times daily as needed for diarrhea or loose stools.    [provider]  metoprolol tartrate (LOPRESSOR) 100 MG tablet Take 1 tablet (100 mg total) by mouth 2 (two) times daily. 06/04/19   Al Decant, MD  montelukast (SINGULAIR) 10 MG tablet  Take 10 mg by mouth every evening.     [provider]  ondansetron (ZOFRAN) 4 MG tablet Take 4 mg by mouth every 4 (four) hours as needed for nausea or vomiting. 09/18/19   [provider]  ondansetron (ZOFRAN) 8 MG tablet Take 8 mg by mouth every 4 (four) hours as needed for nausea.  06/14/19   [provider]  Oxycodone HCl 10 MG TABS Take 10 mg by mouth 3 (three) times daily as needed. 08/25/19   [provider]  pantoprazole (PROTONIX) 40 MG tablet Take 40 mg by mouth daily.    [provider]  sevelamer carbonate (RENVELA) 800 MG tablet TAKE 4 TABS WITH MEALS AND 2 TABS TWICE A DAY WITH SNACKS    [provider]  sulfamethoxazole-trimethoprim (BACTRIM DS) 800-160 MG tablet Take 1 tablet by mouth daily. 12/30/18   Geradine Girt, DO  SYMBICORT 160-4.5 MCG/ACT inhaler Inhale 2 puffs into the lungs 2 (two) times daily.  11/30/18   [provider]  zolpidem (AMBIEN) 10 MG tablet Take 10 mg by mouth at bedtime as needed for sleep.    [provider]     CRITICAL CARE Performed by: Kennieth Rad   Total critical care time: 65 minutes   Critical care time was exclusive of separately billable procedures and treating other patients.   Critical care was necessary to treat or prevent imminent or life-threatening deterioration.   Critical care was time spent personally by me on the following activities: development of treatment plan with patient and/or surrogate as well as nursing, discussions with consultants, evaluation of patient's response to treatment, examination of patient, obtaining history from patient or surrogate, ordering and performing treatments and interventions, ordering and review of laboratory studies, ordering and review of radiographic studies, pulse oximetry and re-evaluation of patient's condition.  Kennieth Rad, MSN, AGACNP-BC [facility] 09/26/2019, 2:07 AM

## 2019-09-26 NOTE — Progress Notes (Signed)
eLink Physician-Brief Progress Note Patient Name: [name] DOB: 09-21-1960 MRN: [id number]   Date of Service  09/26/2019  HPI/Events of Note  Pt with ESRD-DD with non-compliance who presented to the ED in pulmonary edema, with hyperkalemia  (K+ 7.8) and wide complex tachycardia. He was intubated in the ED and is for emergent dialysis.  eICU Interventions  New Patient Evaluation completed.        [name] 09/26/2019, 2:55 AM

## 2019-09-26 NOTE — ED Notes (Signed)
Pt desatting. RT called to come to bedside. Critical care MD at bedside and aware of BP

## 2019-09-26 NOTE — Progress Notes (Signed)
Due to RO complications patient placed on SEQ. Dr. Corliss Marcus aware.

## 2019-09-26 NOTE — Progress Notes (Addendum)
NAME:  [name], MRN:  [id number], DOB:  [birth year], LOS: 0 ADMISSION DATE:  09/25/2019, CONSULTATION DATE:  09/26/2019 REFERRING MD:  Dr. Sabra Heck, CHIEF COMPLAINT:  SOB/ hyperkalemia  Brief History   59 year old male with extensive history presenting with SOB and not feeling well with recent non-compliance in his dialysis found to be in respiratory distress, hyperkalemic with wide complex rhythm.  Required intubation in ER after being obtunded and ongoing respiratory distress.  Temporizing measures completed.  Nephrology consulted and PCCM to admit.  Noted to be positive on COVID 19 screening.   History of present illness   HPI obtained from medical chart review as patient is intubated and sedated on mechanical ventilation.   59 year old male with prior history of ESRD on MWF iHD, Afib, CAD, ongoing tobacco abuse, COPD, pulmonary HTN, chronic cor pulmonale, CAD s/p RCA stent 2018, severe mitral stenosis, HTN, chronic hep B/ C, cirrhosis with recurrent ascites requiring serial paracentesis, anemia, hx of cocaine and marijuana abuse, presenting to ER with complaints of SOB and not feeling well with generalized pain.   Of note he was recently underwent paracentesis 2/9 with 2L removed and again seen in ER on 3/14 for complaints shortness of breath and abdominal pain underwent paracentesis with 4.2L removed and noted to be hyperkalemic at 6.2, given lokelmia and discharged home with plans to have dialysis Monday morning.   However he did not go because he was feeling bad.  He additionally missed entire week of dialysis two weeks ago and last week had two short treatments.  He is followed by Palliative care on outpatient basis and previously documented DNR in Epic.     Noted with EMS to have peaked twaves with wide complex.  On arrival to ER, patient with ongoing wide complex tachycardia.  Labs ntoed for K >7.5, Na 132, CL 92, bicarb 21, BUN/ sCr 72/ 10.76, AG 19, alk phos 131, low albumin and  protein levels, trop hs 95, Hgb 10.5, normal coags, CXR consistent with developing pulmonary edema and not rule out developing infiltrate.  Treated with temporizing measures for hyperkalemia with repeat K 6.7 however complicated by ongoing hypoglycemia.  Nephrology consulted with plans for emergent iHD via patients RUE AVF.  Cardiology also consulted with recommendations for symptomatic care and palliative care given patients history of non compliance.  In ER, his mental status continued to decline with ongoing respiratory distress requiring intubation in ER.  PCCM called for admission.  SARS 2 pending.   Past Medical History  ESRD on MWF iHD, Afib, CAD, ongoing tobacco abuse, COPD, pulmonary HTN, chronic cor pulmonale, CAD s/p RCA stent 2018, severe mitral stenosis, HTN, chronic hep B/ C, cirrhosis, anemia, hx of cocaine and marijuana abuse   Significant Hospital Events   3/17 Admitted/ intubated  Consults:  Nephrology  Cardiology - [facility]   Procedures:  3/16 ETT >>  Significant Diagnostic Tests:   Micro Data:  3/16 SARS 2/ Flu A/B >> neg  Antimicrobials:  N/a  3/17 remdesivir >> 3/17 decadron >>  Interim history/subjective:    Objective   Blood pressure 126/76, pulse 94, temperature 97.8 F (36.6 C), temperature source Axillary, resp. rate 17, height 5' 9" (1.753 m), weight 66.4 kg, SpO2 100 %.    Vent Mode: PRVC FiO2 (%):  [50 %-100 %] 50 % Set Rate:  [16 bmp-18 bmp] 16 bmp Vt Set:  [550 mL-600 mL] 550 mL PEEP:  [5 cmH20-8 cmH20] 8 cmH20 Plateau Pressure:  [  12 cmH20-18 cmH20] 18 cmH20   Intake/Output Summary (Last 24 hours) at 09/26/2019 1610 Last data filed at 09/26/2019 0700 Gross per 24 hour  Intake 451.84 ml  Output --  Net 451.84 ml   Filed Weights   09/26/19 0224 09/26/19 0430  Weight: 63.5 kg 66.4 kg    Examination:  Gen:      No acute distress, chronically ill appearing HEENT:  EOMI, sclera anicteric Neck:     No masses; no thyromegaly, ETT Lungs:     Clear to auscultation bilaterally; normal respiratory effort CV:         Regular rate and rhythm; no murmurs Abd:      + bowel sounds; soft, non-tender; no palpable masses, no distension Ext:    No edema; adequate peripheral perfusion Skin:      Warm and dry; no rash Neuro: alert and oriented x 3 Psych: normal mood and affect  Labs significant for potassium 7.3, BUN/creatinine 76/10.9, CRP 9.1 Chest x-ray-persistent bilateral infiltrates   Resolved Hospital Problem list    Assessment & Plan:   Hyperkalemia in the setting of non compliance with dialysis  ESRD - dialysis dependent MWF P:  Currently on hemodialysis Follow labs postdialysis  Acute respiratory failure, acute hypoxic secondary to pulmonary edema, incidental finding of Covid positive COPD P: Unclear if COVID-19 is the driving issue here or just an incidental finding Continue vent support, follow chest x-ray Start pressure support wean once off dialysis Continue Decadron, remdesivir per protocol We'll hold off on anti-inflammatory therapy such as Actemra or baracitinib unless we cannot get him off the vent  Hypoglycemia  P:  Continue D10  Hx HTN, pulmonary HTN, chronic cor pulmonale, severe MS, and CAD Wide complex tachycardia/ Afib (not on chronic anticoagulation - despite his chronic RV dysfunction, its surprising patient has been able to tolerate iHD so far P:  Holding home meds Telemetry monitoring   Chronic hep B/ C Cirrhosis  Recurrent ascites- last paracentesis 08/21/19 and 09/23/19 -per recent [facility] transplant note 09/20/2019, patient was not a candidate for liver transplant, as he would require liver and kidney but not a candidate for transplant, TiPS or [place] shunt given his chronic right heart failure.  Recurrent ascites thought related to his right heart failure given elevated total fluid protein level rather than true hepatic dysfunction P:  Follow labs.  No evidence for SBP  Protein calorie  malnutrition  Tube feeds if unable to get off the ventilator  Anemia of chronic disease Trend CBC   Diarrhea Check C. difficile  Best practice:  Diet: NPO Pain/Anxiety/Delirium protocol (if indicated): propofol/ prn fentanyl  VAP protocol (if indicated): yes DVT prophylaxis: heparin SQ/ SCDs GI prophylaxis: PPI Glucose control: D10 gtt,  Mobility: BR Code Status: DNR after speaking with sister Family Communication: Sister, [name], updated Disposition: ICU   The patient is critically ill with multiple organ system failure and requires high complexity decision making for assessment and support, frequent evaluation and titration of therapies, advanced monitoring, review of radiographic studies and interpretation of complex data.   Critical Care Time devoted to patient care services, exclusive of separately billable procedures, described in this note is 35 minutes.   Marshell Garfinkel MD [facility] Please see [email or web address] for pager details.  09/26/2019, 8:15 AM

## 2019-09-26 NOTE — Progress Notes (Signed)
Admit: 09/25/2019 LOS: 0  30M severe hyperkalemia, ESRD with noncompliance, COVID19 infection  Subjective:  . Rec 3h full HD with 1K bath after RO issues resolved . Intubated, sedated, on low dose NE,  . UF limited by hypotension . Remdesivir and dex  03/16 0701 - 03/17 0700 In: 451.8 [I.V.:252; IV Piggyback:199.9] Out: -   Filed Weights   09/26/19 0224 09/26/19 0430 09/26/19 0915  Weight: 63.5 kg 66.4 kg 63.2 kg    Scheduled Meds: . chlorhexidine gluconate (MEDLINE KIT)  15 mL Mouth Rinse BID  . chlorhexidine gluconate (MEDLINE KIT)  15 mL Mouth Rinse BID  . Chlorhexidine Gluconate Cloth  6 each Topical Q0600  . dexamethasone (DECADRON) injection  6 mg Intravenous Daily  . dextrose      . heparin  7,500 Units Subcutaneous Q8H  . insulin aspart  0-6 Units Subcutaneous Q4H  . ipratropium-albuterol  3 mL Nebulization Q4H  . mouth rinse  15 mL Mouth Rinse 10 times per day  . mouth rinse  15 mL Mouth Rinse 10 times per day  . pantoprazole (PROTONIX) IV  40 mg Intravenous Daily   Continuous Infusions: . sodium chloride    . sodium chloride    . sodium chloride    . dextrose 30 mL/hr at 09/26/19 0700  . fentaNYL infusion INTRAVENOUS 50 mcg/hr (09/26/19 0903)  . norepinephrine (LEVOPHED) Adult infusion 2 mcg/min (09/26/19 0920)  . propofol (DIPRIVAN) infusion 20 mcg/kg/min (09/26/19 0700)  . [START ON 09/27/2019] remdesivir 100 mg in NS 100 mL     PRN Meds:.sodium chloride, sodium chloride, acetaminophen, albuterol, alteplase, fentaNYL, heparin, heparin, lidocaine (PF), lidocaine-prilocaine, pentafluoroprop-tetrafluoroeth, sodium chloride  Current Labs: reviewed    Physical Exam:  Blood pressure 92/65, pulse 98, temperature 97.8 F (36.6 C), temperature source Axillary, resp. rate 18, height 5' 9"  (1.753 m), weight 63.2 kg, SpO2 100 %. Intubated, sedated RUE AVG +B/T, bandaged No sig LEE Mild distension of abd, soft Regular nl s1s2 Coarse bs b/l ETT in place  Outpt  HD:[facility] 4h 65kg 2/2 bath 450/800 R arm AVG  A 1. ESRD, noncompliant 2. Severe hyperkalemia s/p emergent HD earlier this AM 3. VDRF, COVID19; remdesiver and dexamethosone 4. Chronic HBV infection 5. pulm HTN / cor pulmonale, severe MS 6. PCM 7. COPD Stable labs 8. CKD-BMD 9. ANemia: Hb 10.4  P . Check K at 1600 . Likely to need another HD tomorrow . Medication Issues; o Preferred narcotic agents for pain control are hydromorphone, fentanyl, and methadone. Morphine should not be used.  o Baclofen should be avoided o Avoid oral sodium phosphate and magnesium citrate based laxatives / bowel preps    Pearson Grippe MD 09/26/2019, 11:04 AM  Recent Labs  Lab 09/23/19 2140 09/23/19 2140 09/25/19 2015 09/25/19 2015 09/25/19 2026 09/25/19 2252 09/26/19 0001 09/26/19 0107 09/26/19 0457 09/26/19 0500  NA 135   < > 132*   < > 129*   < > 131*  --  131* 135  K 6.2*   < > >7.5*   < > 7.8*   < > 6.7*  --  7.1* 7.3*  CL 96*   < > 92*  --  98  --   --   --   --  94*  CO2 26  --  21*  --   --   --   --   --   --  23  GLUCOSE 110*   < > 73  --  65*  --   --   --   --  90  BUN 49*   < > 72*  --  63*  --   --   --   --  76*  CREATININE 7.96*   < > 10.76*   < > 10.90*  --   --  11.04*  --  10.97*  CALCIUM 9.8  --  9.7  --   --   --   --   --   --  9.1  PHOS  --   --   --   --   --   --   --   --   --  5.9*   < > = values in this interval not displayed.   Recent Labs  Lab 09/23/19 2140 09/23/19 2140 09/25/19 2015 09/25/19 2026 09/26/19 0107 09/26/19 0457 09/26/19 0611  WBC 5.7   < > 6.1  --  6.8  --  5.6  NEUTROABS 4.3  --  4.7  --   --   --   --   HGB 9.6*   < > 10.5*   < > 10.6* 9.5* 10.4*  HCT 27.9*   < > 31.0*   < > 31.1* 28.0* 29.6*  MCV 77.9*   < > 78.1*  --  78.3*  --  75.7*  PLT 145*   < > 126*  --  131*  --  122*   < > = values in this interval not displayed.

## 2019-09-26 NOTE — ED Notes (Signed)
Late entry due to continuous pt care Additional PIV initiated, 18 G to L forearm. IV flushes with 10 cc NS without s/s of infiltration. Repeat potassium and troponin drawn and sent EDPs at bedside. CBG 17. One amp D50 given.  Repeat CBG 202 2313 100 Roc and 20 mg of etomidate given for intubation 2315 RT at bedside. Size 7.5 ETT inserted by Dr. Sabra Heck. 23 at the lip, positive color change noted, bilat breath sounds present Size 16 OG tube inserted.  CXR completed

## 2019-09-27 ENCOUNTER — Inpatient Hospital Stay (HOSPITAL_COMMUNITY): Payer: Medicare Other

## 2019-09-27 LAB — RENAL FUNCTION PANEL
Albumin: 2.4 g/dL — ABNORMAL LOW (ref 3.5–5.0)
Anion gap: 19 — ABNORMAL HIGH (ref 5–15)
BUN: 56 mg/dL — ABNORMAL HIGH (ref 6–20)
CO2: 24 mmol/L (ref 22–32)
Calcium: 8.7 mg/dL — ABNORMAL LOW (ref 8.9–10.3)
Chloride: 92 mmol/L — ABNORMAL LOW (ref 98–111)
Creatinine, Ser: 8.56 mg/dL — ABNORMAL HIGH (ref 0.61–1.24)
GFR calc Af Amer: 7 mL/min — ABNORMAL LOW (ref >60)
GFR calc non Af Amer: 6 mL/min — ABNORMAL LOW (ref >60)
Glucose, Bld: 124 mg/dL — ABNORMAL HIGH (ref 70–99)
Phosphorus: 7.3 mg/dL — ABNORMAL HIGH (ref 2.5–4.6)
Potassium: 6 mmol/L — ABNORMAL HIGH (ref 3.5–5.1)
Sodium: 135 mmol/L (ref 135–145)

## 2019-09-27 LAB — GLUCOSE, CAPILLARY
Glucose-Capillary: 17 mg/dL — CL (ref 70–99)
Glucose-Capillary: 123 mg/dL — ABNORMAL HIGH (ref 70–99)
Glucose-Capillary: 105 mg/dL — ABNORMAL HIGH (ref 70–99)
Glucose-Capillary: 101 mg/dL — ABNORMAL HIGH (ref 70–99)
Glucose-Capillary: 124 mg/dL — ABNORMAL HIGH (ref 70–99)
Glucose-Capillary: 50 mg/dL — ABNORMAL LOW (ref 70–99)
Glucose-Capillary: 75 mg/dL (ref 70–99)
Glucose-Capillary: 117 mg/dL — ABNORMAL HIGH (ref 70–99)

## 2019-09-27 LAB — CBC
HCT: 30.7 % — ABNORMAL LOW (ref 39.0–52.0)
Hemoglobin: 10.8 g/dL — ABNORMAL LOW (ref 13.0–17.0)
MCH: 26.5 pg (ref 26.0–34.0)
MCHC: 35.2 g/dL (ref 30.0–36.0)
MCV: 75.2 fL — ABNORMAL LOW (ref 80.0–100.0)
Platelets: 175 10*3/uL (ref 150–400)
RBC: 4.08 MIL/uL — ABNORMAL LOW (ref 4.22–5.81)
RDW: 14.6 % (ref 11.5–15.5)
WBC: 6 10*3/uL (ref 4.0–10.5)
nRBC: 0 % (ref 0.0–0.2)

## 2019-09-27 LAB — MAGNESIUM: Magnesium: 1.8 mg/dL (ref 1.7–2.4)

## 2019-09-27 MED ORDER — DEXMEDETOMIDINE HCL IN NACL 400 MCG/100ML IV SOLN
0.4000 ug/kg/h | INTRAVENOUS | Status: DC
  Administered 2019-09-27: 0.7 ug/kg/h via INTRAVENOUS
  Administered 2019-09-27: 0.4 ug/kg/h via INTRAVENOUS
  Administered 2019-09-28 – 2019-09-29 (×2): 0.7 ug/kg/h via INTRAVENOUS
  Filled 2019-09-27 (×5): qty 100

## 2019-09-27 MED ORDER — PHENYLEPHRINE HCL-NACL 10-0.9 MG/250ML-% IV SOLN
0.0000 ug/min | INTRAVENOUS | Status: DC
  Administered 2019-09-27: 30 ug/min via INTRAVENOUS
  Administered 2019-09-27: 15 ug/min via INTRAVENOUS
  Filled 2019-09-27 (×2): qty 250

## 2019-09-27 MED ORDER — AMIODARONE LOAD VIA INFUSION
150.0000 mg | Freq: Once | INTRAVENOUS | Status: AC
  Administered 2019-09-27: 150 mg via INTRAVENOUS
  Filled 2019-09-27 (×3): qty 83.34

## 2019-09-27 MED ORDER — AMIODARONE HCL IN DEXTROSE 360-4.14 MG/200ML-% IV SOLN
60.0000 mg/h | INTRAVENOUS | Status: AC
  Administered 2019-09-27: 60 mg/h via INTRAVENOUS
  Filled 2019-09-27 (×2): qty 200

## 2019-09-27 MED ORDER — AMIODARONE HCL IN DEXTROSE 360-4.14 MG/200ML-% IV SOLN
30.0000 mg/h | INTRAVENOUS | Status: DC
  Administered 2019-09-27 – 2019-09-29 (×3): 30 mg/h via INTRAVENOUS
  Filled 2019-09-27 (×4): qty 200

## 2019-09-27 MED ORDER — VITAL HIGH PROTEIN PO LIQD
1000.0000 mL | ORAL | Status: DC
  Administered 2019-09-27 – 2019-09-28 (×2): 1000 mL

## 2019-09-27 MED ORDER — MAGNESIUM SULFATE IN D5W 1-5 GM/100ML-% IV SOLN
1.0000 g | Freq: Once | INTRAVENOUS | Status: AC
  Administered 2019-09-27: 1 g via INTRAVENOUS
  Filled 2019-09-27: qty 100

## 2019-09-27 MED ORDER — PHENYLEPHRINE HCL-NACL 10-0.9 MG/250ML-% IV SOLN
INTRAVENOUS | Status: AC
  Administered 2019-09-27: 20 ug/min via INTRAVENOUS
  Filled 2019-09-27: qty 250

## 2019-09-27 MED ORDER — PRO-STAT SUGAR FREE PO LIQD
30.0000 mL | Freq: Two times a day (BID) | ORAL | Status: DC
  Administered 2019-09-27 – 2019-09-28 (×3): 30 mL via ORAL
  Filled 2019-09-27 (×3): qty 30

## 2019-09-27 MED ORDER — DEXTROSE 50 % IV SOLN
INTRAVENOUS | Status: AC
  Administered 2019-09-27: 50 mL
  Filled 2019-09-27: qty 50

## 2019-09-27 NOTE — Procedures (Signed)
I was present at this dialysis session. I have reviewed the session itself and made appropriate changes.   Soft BPs, on 5 of NE, holding MAPs in 60s. K 6.0. Using AVG does not have TDC.  Try to complete tx today iHD, some pressor support. 2K. No UF.  If hemodynamics worsen plan for transition to CRRT, but hopefully we can complete Tx today and then not again until Sat.  On PO Vanc ? CDI  Filed Weights   09/26/19 0915 09/27/19 0500 09/27/19 0730  Weight: 63.2 kg 66.7 kg 66.7 kg    Recent Labs  Lab 09/27/19 0500  NA 135  K 6.0*  CL 92*  CO2 24  GLUCOSE 124*  BUN 56*  CREATININE 8.56*  CALCIUM 8.7*  PHOS 7.3*    Recent Labs  Lab 09/23/19 2140 09/23/19 2140 09/25/19 2015 09/25/19 2026 09/26/19 0107 09/26/19 0457 09/26/19 0611 09/26/19 0611 09/26/19 0708 09/26/19 0857 09/27/19 0500  WBC 5.7   < > 6.1   < > 6.8  --  5.6  --   --   --  6.0  NEUTROABS 4.3  --  4.7  --   --   --   --   --   --   --   --   HGB 9.6*   < > 10.5*   < > 10.6*   < > 10.4*   < > 10.2* 10.2* 10.8*  HCT 27.9*   < > 31.0*   < > 31.1*   < > 29.6*   < > 30.0* 30.0* 30.7*  MCV 77.9*   < > 78.1*   < > 78.3*  --  75.7*  --   --   --  75.2*  PLT 145*   < > 126*   < > 131*  --  122*  --   --   --  175   < > = values in this interval not displayed.    Scheduled Meds: . chlorhexidine gluconate (MEDLINE KIT)  15 mL Mouth Rinse BID  . Chlorhexidine Gluconate Cloth  6 each Topical Q0600  . dexamethasone (DECADRON) injection  6 mg Intravenous Daily  . heparin  7,500 Units Subcutaneous Q8H  . insulin aspart  0-6 Units Subcutaneous Q4H  . ipratropium-albuterol  3 mL Nebulization Q4H  . mouth rinse  15 mL Mouth Rinse 10 times per day  . pantoprazole (PROTONIX) IV  40 mg Intravenous Daily  . vancomycin  125 mg Oral QID   Continuous Infusions: . sodium chloride    . sodium chloride    . sodium chloride    . dextrose 30 mL/hr at 09/27/19 0600  . fentaNYL infusion INTRAVENOUS 225 mcg/hr (09/27/19 0600)  .  norepinephrine (LEVOPHED) Adult infusion 3 mcg/min (09/27/19 0600)  . propofol (DIPRIVAN) infusion 30 mcg/kg/min (09/27/19 0600)  . remdesivir 100 mg in NS 100 mL     PRN Meds:.sodium chloride, sodium chloride, acetaminophen, albuterol, alteplase, fentaNYL, heparin, heparin, lidocaine (PF), lidocaine-prilocaine, lip balm, metoprolol tartrate, pentafluoroprop-tetrafluoroeth, sodium chloride      MD 09/27/2019, 8:24 AM   

## 2019-09-27 NOTE — Progress Notes (Addendum)
NAME:  [name], MRN:  [id number], DOB:  Mar 14, 1961, LOS: 1 ADMISSION DATE:  09/25/2019, CONSULTATION DATE:  09/26/2019 REFERRING MD:  Dr. Sabra Heck, CHIEF COMPLAINT:  SOB/ hyperkalemia  Brief History   59 year old male with extensive history presenting with SOB and not feeling well with recent non-compliance in his dialysis found to be in respiratory distress, hyperkalemic with wide complex rhythm.  Required intubation in ER after being obtunded and ongoing respiratory distress.  Temporizing measures completed.  Nephrology consulted and PCCM to admit.  Noted to be positive on COVID 19 screening.   Of note he was recently underwent paracentesis 2/9 with 2L removed and again seen in ER on 3/14 for complaints shortness of breath and abdominal pain underwent paracentesis with 4.2L removed and noted to be hyperkalemic at 6.2, given lokelmia and discharged home with plans to have dialysis Monday morning.   However he did not go because he was feeling bad.  He additionally missed entire week of dialysis two weeks ago and last week had two short treatments.  He is followed by Palliative care on outpatient basis and previously documented DNR in Epic.     Past Medical History  ESRD on MWF iHD, Afib, CAD, ongoing tobacco abuse, COPD, pulmonary HTN, chronic cor pulmonale, CAD s/p RCA stent 2018, severe mitral stenosis, HTN, chronic hep B/ C, cirrhosis, anemia, hx of cocaine and marijuana abuse   Significant Hospital Events   3/17 Admitted/ Intubated  Consults:  Nephrology  Cardiology - [facility]   Procedures:  3/16 ETT >>  Significant Diagnostic Tests:   Micro Data:  3/16 SARS 2/ Flu A/B >> neg  Antimicrobials/COVID Rx   Remdesivir 3/17 >> Decadron 3/17 >> PO vanco 3/17 >>  Interim history/subjective:  Remains on vent and pressors. No acute events overnight  Objective   Blood pressure (!) 87/53, pulse 72, temperature 98 F (36.7 C), temperature source Axillary, resp. rate 16, height  5\' 9"  (1.753 m), weight 66.7 kg, SpO2 100 %.    Vent Mode: PRVC FiO2 (%):  [40 %] 40 % Set Rate:  [16 bmp] 16 bmp Vt Set:  [550 mL] 550 mL PEEP:  [5 cmH20-8 cmH20] 5 cmH20 Plateau Pressure:  [14 cmH20-18 cmH20] 14 cmH20   Intake/Output Summary (Last 24 hours) at 09/27/2019 8242 Last data filed at 09/27/2019 0600 Gross per 24 hour  Intake 1828.29 ml  Output 1190 ml  Net 638.29 ml   Filed Weights   09/26/19 0915 09/27/19 0500 09/27/19 0730  Weight: 63.2 kg 66.7 kg 66.7 kg    Examination:  Gen:      No acute distress, chronically ill appearing HEENT:  EOMI, sclera anicteric Neck:     No masses; no thyromegaly Lungs:    Clear to auscultation bilaterally; normal respiratory effort CV:         Regular rate and rhythm; no murmurs Abd:      + bowel sounds; soft, non-tender; no palpable masses, no distension Ext:    No edema; adequate peripheral perfusion Skin:      Warm and dry; no rash Neuro: Sedated  Labs significant for potassium 6, creatinine 8.56, phos 7.3 Chest x-ray 3/18-cardiomegaly with bilateral interstitial prominence suggestive of CHF.   Resolved Hospital Problem list    Assessment & Plan:   Hyperkalemia in the setting of non compliance with dialysis  ESRD - dialysis dependent MWF P:  Repeat dialysis today Follow labs postdialysis  Acute respiratory failure, acute hypoxic secondary to pulmonary edema,  incidental finding of Covid positive COPD P: Unclear if COVID-19 is the driving issue here or just an incidental finding Continue vent support, follow chest x-ray Start pressure support wean once off dialysis Continue Decadron, remdesivir per protocol We'll hold off on anti-inflammatory therapy such as Actemra or baracitinib as he is on minimal vent settings with unimpressive chest x-ray  Shock, multifactorial due to sepsis from c diff present on admission, sedation Hx HTN, pulmonary HTN, chronic cor pulmonale, severe MS, and CAD Wide complex tachycardia/ Afib  (not on chronic anticoagulation) P:  Holding home meds Telemetry monitoring Change Levophed to neo given intermittent atrial fibrillation May need amio  Chronic hep B/ C Cirrhosis  Recurrent ascites- last paracentesis 08/21/19 and 09/23/19 -per recent [facility] transplant note 09/20/2019, patient was not a candidate for liver transplant, as he would require liver and kidney but not a candidate for transplant, TiPS or [place] shunt given his chronic right heart failure.  Recurrent ascites thought related to his right heart failure given elevated total fluid protein level rather than true hepatic dysfunction P:  Follow labs.  No clinical evidence for SBP  Protein calorie malnutrition  Hypoglycemia Continue D10 Start tube feeds  Anemia of chronic disease Trend CBC   C diff diarrhea PO vanco  Goals of care History of noncompliance, missed dialysis Patient was a DNR, under [facility] as an outpatient Will consult palliative to see  Best practice:  Diet: Tube feed Pain/Anxiety/Delirium protocol (if indicated): Fentanyl drip, propofol.  Switch to Precedex VAP protocol (if indicated): yes DVT prophylaxis: heparin SQ/ SCDs GI prophylaxis: PPI Glucose control: D10 gtt Mobility: BR Code Status: DNR after speaking with sister. Will re engage palliative care again  Family Communication: Sister, [name], updated 3/18 Disposition: ICU   The patient is critically ill with multiple organ system failure and requires high complexity decision making for assessment and support, frequent evaluation and titration of therapies, advanced monitoring, review of radiographic studies and interpretation of complex data.   Critical Care Time devoted to patient care services, exclusive of separately billable procedures, described in this note is 35 minutes.   Marshell Garfinkel MD [facility] Please see [email or web address] for pager details.  09/27/2019, 8:12 AM

## 2019-09-27 NOTE — Progress Notes (Signed)
Initial Nutrition Assessment  DOCUMENTATION CODES:   Not applicable  INTERVENTION:   Recommend initiate Vital High Protein @ 35 ml/hr (840 ml/day) via OG tube 30 ml Prostat BID  Provides: 1040 kcal, 103 grams protein, and 702 ml free water.   TF regimen and propofol at current rate providing 1330 total kcal/day    NUTRITION DIAGNOSIS:   Increased nutrient needs related to (COVID-19) as evidenced by estimated needs.  GOAL:   Patient will meet greater than or equal to 90% of their needs  MONITOR:   Vent status  REASON FOR ASSESSMENT:   Ventilator    ASSESSMENT:   Pt with PMH of ESRD on MWF iHD, Afib, CAD, ongoing tobacco abuse, COPD, pulmonary HTN, chronic cor pulmonale, CAD s/p RCA stent, severe mitral stenosis, HTN, chronic hep B/C, cirrhosis with recurrent ascites requiring serial paracentesis, hx polysubstance abuse, and non-compliance with outpatient HD admitted with hyperkalemia in setting non compliance with HD, acute respiratory failure due to pulmonary edema and incidental finding of COVID-19.   Suspect chronic malnutrition based on medical history. Per RD notes in 2019 pt met criteria for moderate malnutrition   PTA:  Per [facility] notes pt is not a transplant candidate  2/9 paracentesis 3/14 paracentesis  Patient is currently intubated on ventilator support MV: 8.3 L/min Temp (24hrs), Avg:97.3 F (36.3 C), Min:96.1 F (35.6 C), Max:97.8 F (36.6 C)  Propofol: 11 ml/hr (30 mcg) provides: 290 kcal  Medications reviewed and include: decadron, SSI D10 @ 30 ml/hr Levophed @ 3 mcg - MAP 74 Remdesivir  Labs reviewed: K+ 6 (H), BUN: 56 (H), Cr 8.56 (H), PO4: 7.3 (H) CBG's: 118-125-123   UF: 1190 ml 16 F OG tube, tip in gastric lumen per xray  NUTRITION - FOCUSED PHYSICAL EXAM:  Deferred  Diet Order:   Diet Order            Diet NPO time specified  Diet effective now              EDUCATION NEEDS:   No education needs have been identified at  this time  Skin:  Skin Assessment: Reviewed RN Assessment  Last BM:  3/17 diarrhea noted per MD on admission  Height:   Ht Readings from Last 1 Encounters:  09/26/19 5' 9"  (1.753 m)    Weight:   Wt Readings from Last 1 Encounters:  09/27/19 66.7 kg    Ideal Body Weight:  72.7 kg  BMI:  Body mass index is 21.72 kg/m.  Estimated Nutritional Needs:   Kcal:  3391-7921  Protein:  100-125 grams  Fluid:  1.5 L/day  Lockie Pares., RD, LDN, CNSC See AMiON for contact information

## 2019-09-27 NOTE — Progress Notes (Signed)
Son - Loa Socks was updated on plan of care for the patient and all questions were answered.

## 2019-09-28 DIAGNOSIS — Z992 Dependence on renal dialysis: Secondary | ICD-10-CM

## 2019-09-28 DIAGNOSIS — N186 End stage renal disease: Secondary | ICD-10-CM

## 2019-09-28 LAB — GLUCOSE, CAPILLARY
Glucose-Capillary: 113 mg/dL — ABNORMAL HIGH (ref 70–99)
Glucose-Capillary: 119 mg/dL — ABNORMAL HIGH (ref 70–99)
Glucose-Capillary: 114 mg/dL — ABNORMAL HIGH (ref 70–99)
Glucose-Capillary: 128 mg/dL — ABNORMAL HIGH (ref 70–99)
Glucose-Capillary: 114 mg/dL — ABNORMAL HIGH (ref 70–99)

## 2019-09-28 LAB — RENAL FUNCTION PANEL
Albumin: 2.5 g/dL — ABNORMAL LOW (ref 3.5–5.0)
Anion gap: 17 — ABNORMAL HIGH (ref 5–15)
BUN: 33 mg/dL — ABNORMAL HIGH (ref 6–20)
CO2: 24 mmol/L (ref 22–32)
Calcium: 8.7 mg/dL — ABNORMAL LOW (ref 8.9–10.3)
Chloride: 95 mmol/L — ABNORMAL LOW (ref 98–111)
Creatinine, Ser: 5.43 mg/dL — ABNORMAL HIGH (ref 0.61–1.24)
GFR calc Af Amer: 12 mL/min — ABNORMAL LOW (ref >60)
GFR calc non Af Amer: 11 mL/min — ABNORMAL LOW (ref >60)
Glucose, Bld: 120 mg/dL — ABNORMAL HIGH (ref 70–99)
Phosphorus: 5.1 mg/dL — ABNORMAL HIGH (ref 2.5–4.6)
Potassium: 4.8 mmol/L (ref 3.5–5.1)
Sodium: 136 mmol/L (ref 135–145)

## 2019-09-28 LAB — MAGNESIUM: Magnesium: 1.7 mg/dL (ref 1.7–2.4)

## 2019-09-28 MED ORDER — CHLORHEXIDINE GLUCONATE CLOTH 2 % EX PADS
6.0000 | MEDICATED_PAD | Freq: Every day | CUTANEOUS | Status: DC
  Administered 2019-09-28 – 2019-09-29 (×2): 6 via TOPICAL

## 2019-09-28 MED ORDER — LIDOCAINE HCL (PF) 1 % IJ SOLN: 5.0000 mL | INTRAMUSCULAR | Status: DC | PRN

## 2019-09-28 MED ORDER — SODIUM CHLORIDE 0.9 % IV SOLN: 100.0000 mL | INTRAVENOUS | Status: DC | PRN

## 2019-09-28 MED ORDER — HEPARIN SODIUM (PORCINE) 1000 UNIT/ML DIALYSIS: 1000.0000 [IU] | INTRAMUSCULAR | Status: DC | PRN

## 2019-09-28 MED ORDER — ALTEPLASE 2 MG IJ SOLR: 2.0000 mg | Freq: Once | INTRAMUSCULAR | Status: DC | PRN

## 2019-09-28 MED ORDER — LIDOCAINE-PRILOCAINE 2.5-2.5 % EX CREA: 1.0000 "application " | TOPICAL_CREAM | CUTANEOUS | Status: DC | PRN

## 2019-09-28 MED ORDER — ALBUTEROL SULFATE HFA 108 (90 BASE) MCG/ACT IN AERS
2.0000 | INHALATION_SPRAY | Freq: Four times a day (QID) | RESPIRATORY_TRACT | Status: DC | PRN
  Filled 2019-09-28: qty 6.7

## 2019-09-28 MED ORDER — PENTAFLUOROPROP-TETRAFLUOROETH EX AERO: 1.0000 "application " | INHALATION_SPRAY | CUTANEOUS | Status: DC | PRN

## 2019-09-28 NOTE — Procedures (Signed)
Extubation Procedure Note  Patient Details:   Name: [name] DOB: 1961-01-02 MRN: [id number]   Airway Documentation:    Vent end date: 09/28/19 Vent end time: 1030   Evaluation  O2 sats: stable throughout Complications: No apparent complications Patient did tolerate procedure well. Bilateral Breath Sounds: Diminished   Yes   Pt extubated per MD order after weaning with a PS 5 and CPAP 5 for 3 hours. Cuff leak heard prior to extubation. Pt is able to speak his name. RN and RT at bedside. Pt is now on 3L [place].  Esperanza Sheets T 09/28/2019, 10:38 AM

## 2019-09-28 NOTE — Progress Notes (Signed)
NAME:  [name], MRN:  [id number], DOB:  Sep 28, 1960, LOS: 2 ADMISSION DATE:  09/25/2019, CONSULTATION DATE:  09/26/2019 REFERRING MD:  Dr. Sabra Heck, CHIEF COMPLAINT:  SOB/ hyperkalemia  Brief History   59 year old male with ESRD, cirrhosis with ascites presenting with SOB and not feeling well with recent non-compliance in his dialysis found to be in respiratory distress, hyperkalemic with wide complex rhythm.  Required intubation in ER after being obtunded and ongoing respiratory distress. Nephrology consulted and PCCM to admit.  Noted to be positive on COVID 19 screening.   Of note he was  seen in ER on 3/14 for complaints shortness of breath and abdominal pain underwent paracentesis with 4.2L removed and noted to be hyperkalemic at 6.2, given lokelmia and discharged home with plans to have dialysis Monday morning.   However he did not go because he was feeling bad.  He additionally missed entire week of dialysis two weeks ago and last week had two short treatments.  He is followed by Palliative care on outpatient basis and previously documented DNR in Epic.     Past Medical History  ESRD on MWF iHD, Afib, CAD, ongoing tobacco abuse, COPD, pulmonary HTN, chronic cor pulmonale, CAD s/p RCA stent 2018, severe mitral stenosis, HTN, chronic hep B/ C, cirrhosis, anemia, hx of cocaine and marijuana abuse   Significant Hospital Events   3/17 Admitted/ Intubated  paracentesis 2/9 with 2L removed   Consults:  Nephrology  Cardiology - [place]   Procedures:  3/16 ETT >>  Significant Diagnostic Tests:   Micro Data:  3/16 SARS 2/ Flu A/B >> neg 3/17 C diff Ag POS , toxin neg, toxigenic C. difficile by PCR positive   Antimicrobials/COVID Rx   Remdesivir 3/17 >> Decadron 3/17 >> PO vanco 3/17 >>  Interim history/subjective:   Dialyzed yesterday, Remains critically ill, intubated, on 40%/PEEP of 5 Afebrile CAlm On Precedex and fentanyl drip   Objective   Blood pressure 114/63,  pulse 70, temperature 99.8 F (37.7 C), temperature source Axillary, resp. rate 11, height 5\' 9"  (1.753 m), weight 66.7 kg, SpO2 100 %.    Vent Mode: PSV;CPAP FiO2 (%):  [30 %-40 %] 30 % Set Rate:  [16 bmp] 16 bmp Vt Set:  [550 mL] 550 mL PEEP:  [5 cmH20] 5 cmH20 Pressure Support:  [5 cmH20] 5 cmH20 Plateau Pressure:  [14 cmH20-17 cmH20] 17 cmH20   Intake/Output Summary (Last 24 hours) at 09/28/2019 1033 Last data filed at 09/28/2019 0900 Gross per 24 hour  Intake 3534.37 ml  Output -500 ml  Net 4034.37 ml   Filed Weights   09/26/19 0915 09/27/19 0500 09/27/19 0730  Weight: 63.2 kg 66.7 kg 66.7 kg    Examination:  Gen:      No acute distress, chronically ill appearing HEENT:  EOMI, sclera anicteric Neck:     No masses; no thyromegaly Lungs:    No accessory muscle use, bilateral ventilated breath sounds CV:         Regular rate and rhythm; no murmurs Abd:      + bowel sounds; soft, non-tender; no palpable masses, no distension Ext:    No edema; adequate peripheral perfusion Skin:      Warm and dry; no rash Neuro: Awake and moves all 4 extremities  Labs show decreased hyperkalemia, decreased BUN and creatinine after dialysis Chest x-ray 3/19 shows improved bilateral interstitial edema   Resolved Hospital Problem list    Assessment & Plan:   Hyperkalemia  in the setting of non compliance with dialysis  ESRD - dialysis dependent MWF P:  No dialysis required today  Acute respiratory failure, acute hypoxic secondary to pulmonary edema,  incidental finding of Covid positive COPD P: Tolerating pressure support weaning, proceed with extubation Continue Decadron, remdesivir per protocol   Shock, multifactorial due to sepsis from c diff present on admission, sedation Hx HTN, pulmonary HTN, chronic cor pulmonale, severe MS, and CAD Wide complex tachycardia/ Afib (not on chronic anticoagulation) P:  Holding home meds Telemetry monitoring On neo due to intermittent atrial  fibrillation , anticipate should be able to turn off after extubation May need amio  Chronic hep B/ C Cirrhosis  Recurrent ascites- last paracentesis 08/21/19 and 09/23/19,No clinical evidence for SBP -Reviewed [facility] transplant note 09/20/2019, patient was not a candidate for liver transplant, as he would require liver and kidney but not a candidate for transplant, TiPS or [place] shunt given his chronic right heart failure.  Recurrent ascites thought related to his right heart failure given elevated total fluid protein level rather than true hepatic dysfunction  P: Monitor LFTs   Protein calorie malnutrition  Hypoglycemia Continue D10 -until his p.o. intake improves  Anemia of chronic disease Trend CBC   C diff diarrhea PO vanco x 10 days  Goals of care History of noncompliance, missed dialysis Patient was a DNR, under [facility] as an outpatient, confirmed DNR with sister Reconsulted palliative care  Best practice:  Diet: Tube feed Pain/Anxiety/Delirium protocol (if indicated): Fentanyl drip, propofol.  Switch to Precedex VAP protocol (if indicated): yes DVT prophylaxis: heparin SQ/ SCDs GI prophylaxis: PPI Glucose control: D10 gtt Mobility: BR Code Status: DNR  Family Communication: Lenox Ahr, updated 3/18 Disposition: ICU   The patient is critically ill with multiple organ systems failure and requires high complexity decision making for assessment and support, frequent evaluation and titration of therapies, application of advanced monitoring technologies and extensive interpretation of multiple databases. Critical Care Time devoted to patient care services described in this note independent of APP/resident  time is 33 minutes.   Kara Mead MD. [facility]. [facility]  If no response to pager , please call [phone]    09/28/2019, 10:33 AM

## 2019-09-28 NOTE — Progress Notes (Signed)
RN spoke with sister, [name] and updated her. Informed her that he will likely be extubated this afternoon. All questions were answered at this time.

## 2019-09-28 NOTE — Consult Note (Signed)
Consultation Note Date: 09/28/2019   Patient Name: [name]  DOB: January 04, 1961  MRN: [id number]  Age / Sex: 59 y.o., male  PCP: Joshua Side., FNP Referring Physician: Marshell Garfinkel, Diaz  Reason for Consultation: Establishing goals of care  HPI/Patient Profile: 59 y.o. male  with past medical history of CAD, a fib, hepatic cirrhosis with recurrent ascites and frequent paracentesis, HTN, LBBB, COPD- smoker, Diaz HTN, CKD on HD- often unable to complete or attend treatments  admitted on 09/25/2019 with pain all over and generalized weakness that progressed to respiratory failure requiring intubation. Continued workup reveals Covid +, heart failure, sepsis due to C.Diff, overall failure of bodily systems due to missed dialysis.    Clinical Assessment and Goals of Care: Evaluated patient at bedside. He was awake and alert, but very sleepy- having just been intubated today and having sedation medications stopped.  We discussed his overall comordities and their interrelated influence on his disease trajectories.  I asked him if he felt that he was ready to die- he asked me why I asked him that- and then I described to him the last few days of his hospital admission- where he was near dying.  He was surprised to hear about this. He noted he was hungry and wanted to eat.  He tells me prior to admission he lived at home alone- he is hopeful to return home after admission.   I expressed to him that often people who are on dialysis and have complicated and life limiting illnesses will stop going to dialysis because they feel that dialysis is not helpful to increasing their quality of life His reply to me was, "You are right"...when I asked him to specify what I was right about- he closed his eyes and didn't answer me. I asked him who he would like me to call for him and he asked me to call his sister [name]  (although [name] is listed as his HCPOA)  I called [name]- she relayed that patient has had a great deal of decline in his ability to care for himself. He will sometimes come over to get dinner, but will not get out of his car. She agrees that he would need some supervised care at discharge  I attempted to call [name]- who is listed as patient's HCPOA- however- there was no anser and no opportunity for voicemail.  Primary Decision Maker PATIENT    SUMMARY OF RECOMMENDATIONS   - Continue full scope care -Patient may benefit from [place] discussion once he is more alert, however, I don't believe he would choose any different path- he and family will only choose to stop if providers set limits - DNR intact  Code Status/Advance Care Planning:  DNR   Discharge Planning: To Be Determined  Primary Diagnoses: Present on Admission: . Renal failure   I have reviewed the medical record, interviewed the patient and family, and examined the patient. The following aspects are pertinent.  Past Medical History:  Diagnosis Date  . Anemia   .  Anxiety   . Arthritis    left shoulder  . Atherosclerosis of aorta ([place])   . Cardiomegaly   . Chest pain    DATE UNKNOWN, C/O PERIODICALLY  . Cocaine abuse ([facility])   . COPD exacerbation ([place]) 08/17/2016  . Coronary artery disease    stent 02/22/17  . ESRD (end stage renal disease) on dialysis ([place])    "[name]; MWF" (07/04/2017)  . GERD (gastroesophageal reflux disease)    DATE UNKNOWN  . Hemorrhoids   . Hepatitis B, chronic ([place])   . Hepatitis C   . History of kidney stones   . Hyperkalemia   . Hypertension   . Kidney failure   . Metabolic bone disease    Patient denies  . Mitral stenosis   . Myocardial infarction ([place])   . Pneumonia   . Diaz edema   . Solitary rectal ulcer syndrome 07/2017   at [facility] for rectal bleeding  . Tubular adenoma of colon    Social History   Socioeconomic History  . Marital status: Single    Spouse  name: Not on file  . Number of children: 3  . Years of education: 10  . Highest education level: Not on file  Occupational History  . Occupation: Unemployed  Tobacco Use  . Smoking status: Current Every Day Smoker    Packs/day: 0.50    Years: 43.00    Pack years: 21.50    Types: Cigarettes    Start date: 08/13/1973  . Smokeless tobacco: Never Used  . Tobacco comment: i dont know i just make   Substance and Sexual Activity  . Alcohol use: Not Currently    Comment: quit drinking in 2017  . Drug use: Not Currently    Types: Marijuana, Cocaine    Comment: reports using once every 3 months, 04-06-2019 was this   . Sexual activity: Not on file  Other Topics Concern  . Not on file  Social History Narrative   Lives alone   Caffeine use: Coffee-rare   Soda- daily      [name] (03/10/17):   Originally from [facility]. Previously worked trimming trees. No pets currently. No bird or mold exposure.    Social Determinants of Health   Financial Resource Strain:   . Difficulty of Paying Living Expenses:   Food Insecurity:   . Worried About Charity fundraiser in the Last Year:   . Arboriculturist in the Last Year:   Transportation Needs:   . Film/video editor (Medical):   Marland Kitchen Lack of Transportation (Non-Medical):   Physical Activity:   . Days of Exercise per Week:   . Minutes of Exercise per Session:   Stress:   . Feeling of Stress :   Social Connections:   . Frequency of Communication with Friends and Family:   . Frequency of Social Gatherings with Friends and Family:   . Attends Religious Services:   . Active Member of Clubs or Organizations:   . Attends Archivist Meetings:   Marland Kitchen Marital Status:    Family History  Problem Relation Age of Onset  . Heart disease Mother   . Lung cancer Mother   . Heart disease Father   . Malignant hyperthermia Father   . COPD Father   . Throat cancer Sister   . Esophageal cancer Sister   . Hypertension Other   . COPD Other     . Colon cancer Neg Hx   . Colon  polyps Neg Hx   . Rectal cancer Neg Hx   . Stomach cancer Neg Hx    Scheduled Meds: . chlorhexidine gluconate (MEDLINE KIT)  15 mL Mouth Rinse BID  . Chlorhexidine Gluconate Cloth  6 each Topical Q0600  . Chlorhexidine Gluconate Cloth  6 each Topical Q0600  . dexamethasone (DECADRON) injection  6 mg Intravenous Daily  . heparin  7,500 Units Subcutaneous Q8H  . insulin aspart  0-6 Units Subcutaneous Q4H  . mouth rinse  15 mL Mouth Rinse 10 times per day  . pantoprazole (PROTONIX) IV  40 mg Intravenous Daily  . vancomycin  125 mg Oral QID   Continuous Infusions: . sodium chloride    . sodium chloride    . sodium chloride 10 mL/hr at 09/28/19 1500  . sodium chloride    . sodium chloride    . amiodarone 30 mg/hr (09/28/19 1500)  . dexmedetomidine (PRECEDEX) IV infusion 0.8 mcg/kg/hr (09/28/19 1500)  . dextrose Stopped (09/28/19 0912)  . phenylephrine (NEO-SYNEPHRINE) Adult infusion Stopped (09/28/19 0903)  . remdesivir 100 mg in NS 100 mL Stopped (09/28/19 0935)   PRN Meds:.sodium chloride, sodium chloride, sodium chloride, sodium chloride, acetaminophen, albuterol, alteplase, heparin, heparin, lidocaine (PF), lidocaine-prilocaine, lip balm, metoprolol tartrate, pentafluoroprop-tetrafluoroeth, sodium chloride Medications Prior to Admission:  Prior to Admission medications   Medication Sig Start Date End Date Taking? Authorizing Provider  diltiazem (TIAZAC) 240 MG 24 hr capsule Take 240 mg by mouth daily. 08/16/19  Yes Provider, Historical, Diaz  lactulose (CHRONULAC) 10 GM/15ML solution Take 45 mLs by mouth. 03/13/19  Yes Provider, Historical, Diaz  albuterol (VENTOLIN HFA) 108 (90 Base) MCG/ACT inhaler Inhale 2 puffs into the lungs every 6 (six) hours as needed for wheezing or shortness of breath.    Provider, Historical, Diaz  hydrALAZINE (APRESOLINE) 100 MG tablet Take 100 mg by mouth 2 (two) times daily.    Provider, Historical, Diaz  hydrOXYzine  (ATARAX/VISTARIL) 25 MG tablet Take 25 mg by mouth 2 (two) times daily as needed. 08/21/19   Provider, Historical, Diaz  loperamide (IMODIUM A-D) 2 MG tablet Take 2 mg by mouth 4 (four) times daily as needed for diarrhea or loose stools.    Provider, Historical, Diaz  metoprolol tartrate (LOPRESSOR) 100 MG tablet Take 1 tablet (100 mg total) by mouth 2 (two) times daily. 06/04/19   Al Decant, Diaz  montelukast (SINGULAIR) 10 MG tablet Take 10 mg by mouth every evening.     Provider, Historical, Diaz  ondansetron (ZOFRAN) 4 MG tablet Take 4 mg by mouth every 4 (four) hours as needed for nausea or vomiting. 09/18/19   Provider, Historical, Diaz  ondansetron (ZOFRAN) 8 MG tablet Take 8 mg by mouth every 4 (four) hours as needed for nausea.  06/14/19   Provider, Historical, Diaz  Oxycodone HCl 10 MG TABS Take 10 mg by mouth 3 (three) times daily as needed. 08/25/19   Provider, Historical, Diaz  pantoprazole (PROTONIX) 40 MG tablet Take 40 mg by mouth daily.    Provider, Historical, Diaz  sevelamer carbonate (RENVELA) 800 MG tablet TAKE 4 TABS WITH MEALS AND 2 TABS TWICE A DAY WITH SNACKS    Provider, Historical, Diaz  sulfamethoxazole-trimethoprim (BACTRIM DS) 800-160 MG tablet Take 1 tablet by mouth daily. 12/30/18   Geradine Girt, DO  SYMBICORT 160-4.5 MCG/ACT inhaler Inhale 2 puffs into the lungs 2 (two) times daily.  11/30/18   Provider, Historical, Diaz  zolpidem (AMBIEN) 10 MG tablet Take 10 mg by mouth at  bedtime as needed for sleep.    Provider, Historical, Diaz   Allergies  Allergen Reactions  . Tramadol Itching and Other (See Comments)  . Morphine     Other reaction(s): Other (See Comments)  . Morphine And Related Other (See Comments)    Stomach pain  . Pollen Extract     Other reaction(s): Sneezing (finding)  . Acetaminophen Nausea Only    Stomach ache Other reaction(s): Other (See Comments) Stomach ache  . Aspirin Other (See Comments) and Itching    STOMACH PAIN Other reaction(s): Unknown STOMACH PAIN    . Clonidine Itching  . Clonidine Derivatives Itching   Review of Systems  Physical Exam  Vital Signs: BP 123/73 (BP Location: Right Arm)   Pulse 91   Temp 99.5 F (37.5 C) (Axillary)   Resp 15   Ht _0  (1.753 m)   Wt 65.5 kg   SpO2 94%   BMI 21.32 kg/m  Pain Scale: 0-10   Pain Score: 0-No pain   SpO2: SpO2: 94 % O2 Device:SpO2: 94 % O2 Flow Rate: .O2 Flow Rate (L/min): 2 L/min  IO: Intake/output summary:   Intake/Output Summary (Last 24 hours) at 09/28/2019 1625 Last data filed at 09/28/2019 1500 Gross per 24 hour  Intake 2846.03 ml  Output 2000 ml  Net 846.03 ml    LBM: Last BM Date: 09/26/19 Baseline Weight: Weight: 63.5 kg Most recent weight: Weight: 65.5 kg     Palliative Assessment/Data: PPS: 20%     Thank you for this consult. Palliative medicine will continue to follow and assist as needed.   Time In: 1515 Time Out: 1620 Time Total: 65 minutes Greater than 50%  of this time was spent counseling and coordinating care related to the above assessment and plan.  Signed by: Mariana Kaufman, AGNP-C Palliative Medicine    Please contact Palliative Medicine Team phone at [phone] for questions and concerns.  For individual provider: See Shea Evans

## 2019-09-28 NOTE — Progress Notes (Signed)
Admit: 09/25/2019 LOS: 2  44M severe hyperkalemia, ESRD with noncompliance, COVID19 infection  Subjective: on HD now, awake and extubated, no c/o's.   03/18 0701 - 03/19 0700 In: 3492.2 [I.V.:2807.2; NG/GT:555; IV Piggyback:130.1] Out: -500   Filed Weights   09/27/19 0500 09/27/19 0730 09/28/19 1115  Weight: 66.7 kg 66.7 kg 67.5 kg    Scheduled Meds: . chlorhexidine gluconate (MEDLINE KIT)  15 mL Mouth Rinse BID  . Chlorhexidine Gluconate Cloth  6 each Topical Q0600  . Chlorhexidine Gluconate Cloth  6 each Topical Q0600  . dexamethasone (DECADRON) injection  6 mg Intravenous Daily  . heparin  7,500 Units Subcutaneous Q8H  . insulin aspart  0-6 Units Subcutaneous Q4H  . mouth rinse  15 mL Mouth Rinse 10 times per day  . pantoprazole (PROTONIX) IV  40 mg Intravenous Daily  . vancomycin  125 mg Oral QID   Continuous Infusions: . sodium chloride    . sodium chloride    . sodium chloride 10 mL/hr at 09/28/19 0900  . sodium chloride    . sodium chloride    . amiodarone 30 mg/hr (09/28/19 0900)  . dexmedetomidine (PRECEDEX) IV infusion 0.9 mcg/kg/hr (09/28/19 0900)  . dextrose 30 mL/hr at 09/28/19 0900  . phenylephrine (NEO-SYNEPHRINE) Adult infusion 10 mcg/min (09/28/19 0900)  . remdesivir 100 mg in NS 100 mL 100 mg (09/28/19 0905)   PRN Meds:.sodium chloride, sodium chloride, sodium chloride, sodium chloride, acetaminophen, albuterol, alteplase, heparin, heparin, lidocaine (PF), lidocaine-prilocaine, lip balm, metoprolol tartrate, pentafluoroprop-tetrafluoroeth, sodium chloride  Current Labs: reviewed    Physical Exam:  Blood pressure 121/66, pulse 71, temperature 98.8 F (37.1 C), temperature source Oral, resp. rate 17, height 5' 9" (1.753 m), weight 67.5 kg, SpO2 99 %. Intubated, sedated RUE AVG +B/T, bandaged No sig LEE Mild distension of abd, soft Regular nl s1s2 Coarse bs b/l ETT in place  [facility] HD:[facility] 4h 65kg 2/2 bath 450/800 R arm AVG  Assessment  / Plan 1. ESRD-  Noncompliant. Had HD yest and on again today to get back on schedule.  2. Severe hyperkalemia-  s/p emergent HD on 3/18 3. HTN/ vol - BP's low to normal, + 2-3 kg. Uf same w/ HD today 4. COVID19- remdesiver and dexamethosone 5. Chronic HBV infection 6. Pulm HTN / cor pulmonale/ severe MS 7. PCM 8. COPD Stable labs 9. CKD-BMD 10. Anemia: Hb 10.4      Recent Labs  Lab 09/26/19 0500 09/26/19 0708 09/26/19 1228 09/26/19 1228 09/26/19 1624 09/27/19 0500 09/28/19 0225  NA 135   < > 132*  --   --  135 136  K 7.3*   < > 5.7*   < > 5.9* 6.0* 4.8  CL 94*   < > 91*  --   --  92* 95*  CO2 23   < > 24  --   --  24 24  GLUCOSE 90   < > 117*  --   --  124* 120*  BUN 76*   < > 49*  --   --  56* 33*  CREATININE 10.97*   < > 8.06*  --   --  8.56* 5.43*  CALCIUM 9.1   < > 8.6*  --   --  8.7* 8.7*  PHOS 5.9*  --   --   --   --  7.3* 5.1*   < > = values in this interval not displayed.   Recent Labs  Lab 09/23/19 2140 09/23/19 2140 09/25/19 2015  09/25/19 2026 09/26/19 0107 09/26/19 0457 09/26/19 0867 09/26/19 0611 09/26/19 0708 09/26/19 0857 09/27/19 0500  WBC 5.7   < > 6.1   < > 6.8  --  5.6  --   --   --  6.0  NEUTROABS 4.3  --  4.7  --   --   --   --   --   --   --   --   HGB 9.6*   < > 10.5*   < > 10.6*   < > 10.4*   < > 10.2* 10.2* 10.8*  HCT 27.9*   < > 31.0*   < > 31.1*   < > 29.6*   < > 30.0* 30.0* 30.7*  MCV 77.9*   < > 78.1*   < > 78.3*  --  75.7*  --   --   --  75.2*  PLT 145*   < > 126*   < > 131*  --  122*  --   --   --  175   < > = values in this interval not displayed.

## 2019-09-29 DIAGNOSIS — Z7189 Other specified counseling: Secondary | ICD-10-CM

## 2019-09-29 DIAGNOSIS — K7460 Unspecified cirrhosis of liver: Secondary | ICD-10-CM

## 2019-09-29 DIAGNOSIS — Z515 Encounter for palliative care: Secondary | ICD-10-CM

## 2019-09-29 DIAGNOSIS — B1910 Unspecified viral hepatitis B without hepatic coma: Secondary | ICD-10-CM

## 2019-09-29 DIAGNOSIS — A0472 Enterocolitis due to Clostridium difficile, not specified as recurrent: Secondary | ICD-10-CM

## 2019-09-29 LAB — RENAL FUNCTION PANEL
Albumin: 2.2 g/dL — ABNORMAL LOW (ref 3.5–5.0)
Anion gap: 12 (ref 5–15)
BUN: 26 mg/dL — ABNORMAL HIGH (ref 6–20)
CO2: 26 mmol/L (ref 22–32)
Calcium: 8.6 mg/dL — ABNORMAL LOW (ref 8.9–10.3)
Chloride: 94 mmol/L — ABNORMAL LOW (ref 98–111)
Creatinine, Ser: 4.51 mg/dL — ABNORMAL HIGH (ref 0.61–1.24)
GFR calc Af Amer: 16 mL/min — ABNORMAL LOW (ref >60)
GFR calc non Af Amer: 13 mL/min — ABNORMAL LOW (ref >60)
Glucose, Bld: 98 mg/dL (ref 70–99)
Phosphorus: 3.4 mg/dL (ref 2.5–4.6)
Potassium: 4.1 mmol/L (ref 3.5–5.1)
Sodium: 132 mmol/L — ABNORMAL LOW (ref 135–145)

## 2019-09-29 LAB — CBC
HCT: 26.2 % — ABNORMAL LOW (ref 39.0–52.0)
Hemoglobin: 9.2 g/dL — ABNORMAL LOW (ref 13.0–17.0)
MCH: 26.8 pg (ref 26.0–34.0)
MCHC: 35.1 g/dL (ref 30.0–36.0)
MCV: 76.4 fL — ABNORMAL LOW (ref 80.0–100.0)
Platelets: 105 10*3/uL — ABNORMAL LOW (ref 150–400)
RBC: 3.43 MIL/uL — ABNORMAL LOW (ref 4.22–5.81)
RDW: 14.6 % (ref 11.5–15.5)
WBC: 7.8 10*3/uL (ref 4.0–10.5)
nRBC: 0 % (ref 0.0–0.2)

## 2019-09-29 LAB — GLUCOSE, CAPILLARY
Glucose-Capillary: 104 mg/dL — ABNORMAL HIGH (ref 70–99)
Glucose-Capillary: 122 mg/dL — ABNORMAL HIGH (ref 70–99)
Glucose-Capillary: 130 mg/dL — ABNORMAL HIGH (ref 70–99)
Glucose-Capillary: 133 mg/dL — ABNORMAL HIGH (ref 70–99)
Glucose-Capillary: 93 mg/dL (ref 70–99)
Glucose-Capillary: 99 mg/dL (ref 70–99)

## 2019-09-29 LAB — MAGNESIUM: Magnesium: 1.5 mg/dL — ABNORMAL LOW (ref 1.7–2.4)

## 2019-09-29 MED ORDER — LIDOCAINE HCL (PF) 1 % IJ SOLN: 5.0000 mL | INTRAMUSCULAR | Status: DC | PRN

## 2019-09-29 MED ORDER — OXYCODONE HCL 5 MG PO TABS
5.0000 mg | ORAL_TABLET | ORAL | Status: DC | PRN
  Administered 2019-09-29 (×2): 5 mg via ORAL
  Filled 2019-09-29 (×2): qty 1

## 2019-09-29 MED ORDER — LIDOCAINE-PRILOCAINE 2.5-2.5 % EX CREA: 1.0000 "application " | TOPICAL_CREAM | CUTANEOUS | Status: DC | PRN

## 2019-09-29 MED ORDER — PENTAFLUOROPROP-TETRAFLUOROETH EX AERO: 1.0000 "application " | INHALATION_SPRAY | CUTANEOUS | Status: DC | PRN

## 2019-09-29 MED ORDER — PANTOPRAZOLE SODIUM 40 MG PO TBEC: 40.0000 mg | DELAYED_RELEASE_TABLET | Freq: Every day | ORAL | Status: DC

## 2019-09-29 MED ORDER — FAMOTIDINE 20 MG PO TABS
20.0000 mg | ORAL_TABLET | Freq: Every day | ORAL | Status: DC
  Administered 2019-09-29 – 2019-09-30 (×2): 20 mg via ORAL
  Filled 2019-09-29 (×2): qty 1

## 2019-09-29 MED ORDER — ORAL CARE MOUTH RINSE: 15.0000 mL | Freq: Two times a day (BID) | OROMUCOSAL | Status: DC

## 2019-09-29 MED ORDER — ALTEPLASE 2 MG IJ SOLR: 2.0000 mg | Freq: Once | INTRAMUSCULAR | Status: DC | PRN

## 2019-09-29 MED ORDER — HEPARIN SODIUM (PORCINE) 1000 UNIT/ML DIALYSIS: 1000.0000 [IU] | INTRAMUSCULAR | Status: DC | PRN

## 2019-09-29 MED ORDER — FENTANYL CITRATE (PF) 100 MCG/2ML IJ SOLN
50.0000 ug | INTRAMUSCULAR | Status: DC | PRN
  Administered 2019-09-29 – 2019-09-30 (×4): 50 ug via INTRAVENOUS
  Filled 2019-09-29 (×4): qty 2

## 2019-09-29 MED ORDER — SODIUM CHLORIDE 0.9 % IV SOLN: 100.0000 mL | INTRAVENOUS | Status: DC | PRN

## 2019-09-29 MED ORDER — CHLORHEXIDINE GLUCONATE CLOTH 2 % EX PADS
6.0000 | MEDICATED_PAD | Freq: Every day | CUTANEOUS | Status: DC
  Administered 2019-09-29 – 2019-09-30 (×2): 6 via TOPICAL

## 2019-09-29 MED ORDER — CHLORHEXIDINE GLUCONATE 0.12 % MT SOLN
15.0000 mL | Freq: Two times a day (BID) | OROMUCOSAL | Status: DC
  Administered 2019-09-29 – 2019-09-30 (×2): 15 mL via OROMUCOSAL
  Filled 2019-09-29 (×2): qty 15

## 2019-09-29 NOTE — Progress Notes (Signed)
Spoke with [name],RN re: [facility]. [place], room [phone].  Confirmed patient needs enteric precautions for C-Diff positive PCR on 09/26/2019 (per Clostridium difficile Management polcy).

## 2019-09-29 NOTE — Progress Notes (Signed)
Admit: 09/25/2019 LOS: 3  81M severe hyperkalemia, ESRD with noncompliance, COVID19 infection  Subjective:  Patient not examined today directly given COVID-19 + status, utilizing data taken from chart +/- discussions w/ providers and staff.   Date taken from chart review. 2L off w/ HD yesterday.   03/19 0701 - 03/20 0700 In: 1491.6 [P.O.:240; I.V.:1086.6; NG/GT:65; IV Piggyback:100] Out: 2002 [Stool:2]  Filed Weights   09/28/19 1115 09/28/19 1420 09/29/19 0417  Weight: 67.5 kg 65.5 kg 69.3 kg    Scheduled Meds: . Chlorhexidine Gluconate Cloth  6 each Topical Q0600  . dexamethasone (DECADRON) injection  6 mg Intravenous Daily  . famotidine  20 mg Oral QHS  . heparin  7,500 Units Subcutaneous Q8H  . insulin aspart  0-6 Units Subcutaneous Q4H  . vancomycin  125 mg Oral QID   Continuous Infusions: . sodium chloride    . sodium chloride    . sodium chloride 10 mL/hr at 09/29/19 0700  . sodium chloride    . sodium chloride    . sodium chloride    . sodium chloride    . remdesivir 100 mg in NS 100 mL 100 mg (09/29/19 0932)   PRN Meds:.sodium chloride, sodium chloride, sodium chloride, sodium chloride, sodium chloride, sodium chloride, acetaminophen, albuterol, alteplase, alteplase, heparin, heparin, lidocaine (PF), lidocaine (PF), lidocaine-prilocaine, lidocaine-prilocaine, lip balm, metoprolol tartrate, pentafluoroprop-tetrafluoroeth, pentafluoroprop-tetrafluoroeth  Current Labs: reviewed    Physical Exam:  Blood pressure 124/62, pulse 60, temperature 100.3 F (37.9 C), temperature source Oral, resp. rate 20, height 5\' 9"  (1.753 m), weight 69.3 kg, SpO2 99 %.  Patient not examined today directly given COVID-19 + status, utilizing data taken from chart +/- discussions w/ providers and staff.    [facility] HD:[facility] 4h 65kg 2/2 bath 450/800 R arm AVG  Assessment / Plan 1. ESRD-  Noncompliant. SP HD x 3 here (Tu, Th, Fri). Next HD Monday. Stable renal status at this time.   2. Hyperkalemia- resolved w/ HD 3. HTN/ vol - BP's low to normal, euvolemic on exam. Sig wt loss per exam 4. COVID19- remdesiver and dexamethosone 5. Chronic HBV infection 6. Pulm HTN / cor pulmonale/ severe MS 7. PCM 8. COPD Stable labs 9. CKD-BMD 10. Anemia: Hb 9.2, get records   [name], MD 09/29/2019, 12:35 PM     Recent Labs  Lab 09/27/19 0500 09/28/19 0225 09/29/19 0433  NA 135 136 132*  K 6.0* 4.8 4.1  CL 92* 95* 94*  CO2 24 24 26   GLUCOSE 124* 120* 98  BUN 56* 33* 26*  CREATININE 8.56* 5.43* 4.51*  CALCIUM 8.7* 8.7* 8.6*  PHOS 7.3* 5.1* 3.4   Recent Labs  Lab 09/23/19 2140 09/23/19 2140 09/25/19 2015 09/25/19 2026 09/26/19 0611 09/26/19 0708 09/26/19 0857 09/27/19 0500 09/29/19 0433  WBC 5.7   < > 6.1   < > 5.6  --   --  6.0 7.8  NEUTROABS 4.3  --  4.7  --   --   --   --   --   --   HGB 9.6*   < > 10.5*   < > 10.4*   < > 10.2* 10.8* 9.2*  HCT 27.9*   < > 31.0*   < > 29.6*   < > 30.0* 30.7* 26.2*  MCV 77.9*   < > 78.1*   < > 75.7*  --   --  75.2* 76.4*  PLT 145*   < > 126*   < > 122*  --   --  175 105*   < > = values in this interval not displayed.

## 2019-09-29 NOTE — Evaluation (Signed)
Physical Therapy Evaluation Patient Details Name: [name] MRN: [id number] DOB: May 04, 1961 Today's Date: 09/29/2019   History of Present Illness  59 yo male with hx of ESRD and cirrhosis presented with dyspnea after recently missing HD.  Had hyperkalemia with ECG changes and required intubation (extubated 3/19).  Found to also be positive for COVID 19.PMH-ESRD on MWF iHD, Afib, CAD, ongoing tobacco abuse, COPD, pulmonary HTN, chronic cor pulmonale, CAD s/p RCA stent 2018, severe mitral stenosis, HTN, chronic hep B/ C, cirrhosis, anemia, hx of cocaine and marijuana abuse   Clinical Impression   Pt admitted with above diagnosis. Patient was independent PTA--driving to dialysis, using electric cart to grocery shop, denies falls or near falls. Per chart, sister has expressed concern that he is not taking care of himself. Despite nearly 3 days of bedrest (including intubation), he tolerated ambulating with RW on room air 22 ft, seated rest, and again 22 ft. No overt losses of balance. Patient reporting he may go stay with his sister on discharge (stating he has done this before). If sister able to provide assistance, feel home with [facility] is appropriate plan. Pt currently with functional limitations due to the deficits listed below (see PT Problem List). Pt will benefit from skilled PT to increase their independence and safety with mobility to allow discharge to the venue listed below.       Follow Up Recommendations Home health PT;Supervision for mobility/OOB(if family can provide assistance; otherwise may need SNF)    Equipment Recommendations  Rolling walker with 5" wheels    Recommendations for Other Services OT consult     Precautions / Restrictions Precautions Precautions: Fall Precaution Comments: denies h/o falls Restrictions Weight Bearing Restrictions: No      Mobility  Bed Mobility               General bed mobility comments: up in recliner on  arrival  Transfers Overall transfer level: Needs assistance Equipment used: Rolling walker (2 wheeled) Transfers: Sit to/from Stand Sit to Stand: Min assist;Mod assist         General transfer comment: vc for safe hand placment with RW; assist to boost and steady as he transitioned hands from armrests to RW; min from recliner; mod from standard toilet  Ambulation/Gait Ambulation/Gait assistance: Min assist Gait Distance (Feet): 22 Feet(x 2; seated rest between) Assistive device: Rolling walker (2 wheeled) Gait Pattern/deviations: Step-through pattern;Trunk flexed;Wide base of support Gait velocity: decr   General Gait Details: on room air; sats >92%; vc for safe use of RW (especially proximity to RW)  Science writer    Modified Rankin (Stroke Patients Only)       Balance Overall balance assessment: Needs assistance Sitting-balance support: No upper extremity supported;Feet supported Sitting balance-Leahy Scale: Fair     Standing balance support: Single extremity supported;During functional activity Standing balance-Leahy Scale: Poor Standing balance comment: after standing from toilet, used one hand to wipe himself and one hand on RW                             Pertinent Vitals/Pain Pain Assessment: 0-10 Pain Score: 10-Worst pain ever Pain Location: left side, left hand Pain Descriptors / Indicators: Grimacing;Guarding;Pressure Pain Intervention(s): Limited activity within patient's tolerance;Repositioned;Patient requesting pain meds-RN notified    Home Living Family/patient expects to be discharged to:: Private residence Living Arrangements: Alone Available Help at Discharge:  Family(sister nearby) Type of Home: Apartment Home Access: Stairs to enter Entrance Stairs-Rails: None Entrance Stairs-Number of Steps: 1+1 Home Layout: One level Home Equipment: Cane - single point;Grab bars - tub/shower      Prior Function  Level of Independence: Independent         Comments: has not been using cane recently; drives himself to dialysis; uses electric cart at grocery store; per chart, sister states pt not taking care of himself like he should     Hand Dominance   Dominant Hand: Right    Extremity/Trunk Assessment   Upper Extremity Assessment Upper Extremity Assessment: Generalized weakness    Lower Extremity Assessment Lower Extremity Assessment: Generalized weakness    Cervical / Trunk Assessment Cervical / Trunk Assessment: Other exceptions Cervical / Trunk Exceptions: left flank pain  Communication   Communication: No difficulties  Cognition Arousal/Alertness: Awake/alert Behavior During Therapy: WFL for tasks assessed/performed Overall Cognitive Status: Within Functional Limits for tasks assessed                                 General Comments: not formally assessed      General Comments General comments (skin integrity, edema, etc.): Educated pt on potential need for post-acute rehab. He states he has stayed with his sister before and may do that again on discharge.     Exercises     Assessment/Plan    PT Assessment Patient needs continued PT services  PT Problem List Decreased strength;Decreased activity tolerance;Decreased balance;Decreased mobility;Decreased knowledge of use of DME;Pain       PT Treatment Interventions DME instruction;Gait training;Functional mobility training;Therapeutic activities;Therapeutic exercise;Balance training;Patient/family education    PT Goals (Current goals can be found in the Care Plan section)  Acute Rehab PT Goals Patient Stated Goal: return home with an aide to assist him PT Goal Formulation: With patient Time For Goal Achievement: 10/13/19 Potential to Achieve Goals: Good    Frequency Min 3X/week   Barriers to discharge Decreased caregiver support potentially will stay with sister; also asking about getting an aide     Co-evaluation               AM-PAC PT "6 Clicks" Mobility  Outcome Measure Help needed turning from your back to your side while in a flat bed without using bedrails?: A Little Help needed moving from lying on your back to sitting on the side of a flat bed without using bedrails?: A Little Help needed moving to and from a bed to a chair (including a wheelchair)?: A Little Help needed standing up from a chair using your arms (e.g., wheelchair or bedside chair)?: A Little Help needed to walk in hospital room?: A Little Help needed climbing 3-5 steps with a railing? : A Lot 6 Click Score: 17    End of Session   Activity Tolerance: Patient limited by pain Patient left: in chair;with call bell/phone within reach;with nursing/sitter in room Nurse Communication: Mobility status PT Visit Diagnosis: Pain;Muscle weakness (generalized) (M62.81);Difficulty in walking, not elsewhere classified (R26.2) Pain - Right/Left: Left Pain - part of body: (torso)    Time: 6834-1962 PT Time Calculation (min) (ACUTE ONLY): 38 min   Charges:   PT Evaluation $PT Eval Moderate Complexity: 1 Mod PT Treatments $Gait Training: 8-22 mins $Self Care/Home Management: 8-22         Arby Barrette, PT Pager [phone]   Rexanne Mano 09/29/2019, 5:40 PM

## 2019-09-29 NOTE — Progress Notes (Signed)
NAME:  Ryin Ambrosius, MRN:  [id number], DOB:  April 08, 1961, LOS: 3 ADMISSION DATE:  09/25/2019, CONSULTATION DATE:  09/26/2019 REFERRING MD:  Dr. Sabra Heck, CHIEF COMPLAINT:  SOB/ hyperkalemia  Brief History   59 yo male with hx of ESRD and cirrhosis presented with dyspnea after recently missing HD.  Had hyperkalemia with ECG changes and required intubation.  Found to also be positive for COVID 19.  Past Medical History  ESRD on MWF iHD, Afib, CAD, ongoing tobacco abuse, COPD, pulmonary HTN, chronic cor pulmonale, CAD s/p RCA stent 2018, severe mitral stenosis, HTN, chronic hep B/ C, cirrhosis, anemia, hx of cocaine and marijuana abuse   Significant Hospital Events   3/17 Admit 3/19 extubated 3/20 off amiodarone/precedex gtt, tranfer to telemetry  Consults:  Nephrology  Cardiology - [place]   Procedures:  3/16 ETT >> 3/19  Significant Diagnostic Tests:    Micro Data:  SARS CoV2 PCR 3/16 >> positive Influenza PCR 3/16 >> negative HIV 3/17 >> nonreactive C diff 3/17 >> Ag positive, toxin negative, PCR postive  COVID Therapy:  Remdesivir 3/17 >> Decadron 3/17 >>  Antimicrobials:  PO vancomycin 3/17 >>  Interim history/subjective:  Mild discomfort at in LLQ at prior paracentesis site.  Wants something more solid to eat.  Objective   Blood pressure 124/62, pulse 60, temperature 100.3 F (37.9 C), temperature source Oral, resp. rate 20, height 5\' 9"  (1.753 m), weight 69.3 kg, SpO2 99 %.        Intake/Output Summary (Last 24 hours) at 09/29/2019 1660 Last data filed at 09/29/2019 0700 Gross per 24 hour  Intake 1199.28 ml  Output 2002 ml  Net -802.72 ml   Filed Weights   09/28/19 1115 09/28/19 1420 09/29/19 0417  Weight: 67.5 kg 65.5 kg 69.3 kg    Examination:   General - alert Eyes - pupils reactive ENT - no sinus tenderness, no stridor Cardiac - regular rate/rhythm, no murmur Chest - equal breath sounds b/l, no wheezing or rales Abdomen - soft, non tender, +  bowel sounds, mild distention, mild tenderness at paracentesis site, no rebound or guarding Extremities - decreased muscle bulk Skin - no rashes Neuro - normal strength, moves extremities, follows commands   Resolved Hospital Problem list   Hyperkalemia 2nd to non compliance with HD, Wide complex tachycardia in setting of hyperkalemia, Acute hypoxic respiratory failure, Acute metabolic encephalopathy 2nd to renal failure and hypoxia, Septic shock  Assessment & Plan:   COVID 19 infection. - day 4/5 of remdesivir - day 4/10 of decadron   ESRD. - HD per renal  C diff colitis. - day 4/10 of oral vancomycin - contact isolation  Hx of COPD. - continue BDs  Chronic hepatitis B and C with cirrhosis and ascites. - f/u LFTs intermittently - had assessment at [facility] on 09/20/19 >> not transplant candidate - not candidate for TIPS or [place] shunt in setting of Rt heart failure  Hx of PAF, HTN, CAD s/p RCA stent. - not candidate for anticoagulation due to liver/renal disease and non compliance - d/c amiodarone - restart lopressor if he becomes tachycardic  Moderate protein calorie malnutrition. - advance diet as able  Anemia of chronic disease - f/u CBC - transfuse for Hb < 7  Goals of care. - DNR >> confirmed with pt's sister - under [facility] as outpt - palliative care consulted  Best practice:  Diet: carb modified/renal diet DVT prophylaxis: SQ heparin GI prophylaxis: pepcid Mobility: out of bed to chair Code Status: DNR  Disposition: Telemetry  Will ask [facility] to assume care from 3/21 and PCCM sign off.  Chesley Mires, MD [facility] Pulmonary/Critical Care 09/29/2019, 10:07 AM

## 2019-09-30 ENCOUNTER — Inpatient Hospital Stay (HOSPITAL_COMMUNITY): Payer: Medicare Other

## 2019-09-30 DIAGNOSIS — N185 Chronic kidney disease, stage 5: Secondary | ICD-10-CM

## 2019-09-30 DIAGNOSIS — Z978 Presence of other specified devices: Secondary | ICD-10-CM

## 2019-09-30 LAB — CBC
HCT: 25.7 % — ABNORMAL LOW (ref 39.0–52.0)
HCT: 25.8 % — ABNORMAL LOW (ref 39.0–52.0)
Hemoglobin: 9 g/dL — ABNORMAL LOW (ref 13.0–17.0)
Hemoglobin: 9.1 g/dL — ABNORMAL LOW (ref 13.0–17.0)
MCH: 26.2 pg (ref 26.0–34.0)
MCH: 26.8 pg (ref 26.0–34.0)
MCHC: 35 g/dL (ref 30.0–36.0)
MCHC: 35.3 g/dL (ref 30.0–36.0)
MCV: 74.9 fL — ABNORMAL LOW (ref 80.0–100.0)
MCV: 75.9 fL — ABNORMAL LOW (ref 80.0–100.0)
Platelets: 105 10*3/uL — ABNORMAL LOW (ref 150–400)
Platelets: 108 10*3/uL — ABNORMAL LOW (ref 150–400)
RBC: 3.43 MIL/uL — ABNORMAL LOW (ref 4.22–5.81)
RBC: 3.4 MIL/uL — ABNORMAL LOW (ref 4.22–5.81)
RDW: 14.4 % (ref 11.5–15.5)
RDW: 14.6 % (ref 11.5–15.5)
WBC: 9.7 10*3/uL (ref 4.0–10.5)
WBC: 9.3 10*3/uL (ref 4.0–10.5)
nRBC: 0 % (ref 0.0–0.2)
nRBC: 0 % (ref 0.0–0.2)

## 2019-09-30 LAB — RENAL FUNCTION PANEL
Albumin: 2.6 g/dL — ABNORMAL LOW (ref 3.5–5.0)
Anion gap: 16 — ABNORMAL HIGH (ref 5–15)
BUN: 37 mg/dL — ABNORMAL HIGH (ref 6–20)
CO2: 25 mmol/L (ref 22–32)
Calcium: 9.5 mg/dL (ref 8.9–10.3)
Chloride: 92 mmol/L — ABNORMAL LOW (ref 98–111)
Creatinine, Ser: 5.58 mg/dL — ABNORMAL HIGH (ref 0.61–1.24)
GFR calc Af Amer: 12 mL/min — ABNORMAL LOW (ref >60)
GFR calc non Af Amer: 10 mL/min — ABNORMAL LOW (ref >60)
Glucose, Bld: 89 mg/dL (ref 70–99)
Phosphorus: 4.1 mg/dL (ref 2.5–4.6)
Potassium: 4.1 mmol/L (ref 3.5–5.1)
Sodium: 133 mmol/L — ABNORMAL LOW (ref 135–145)

## 2019-09-30 LAB — BASIC METABOLIC PANEL
Anion gap: 16 — ABNORMAL HIGH (ref 5–15)
BUN: 39 mg/dL — ABNORMAL HIGH (ref 6–20)
CO2: 23 mmol/L (ref 22–32)
Calcium: 9.4 mg/dL (ref 8.9–10.3)
Chloride: 93 mmol/L — ABNORMAL LOW (ref 98–111)
Creatinine, Ser: 5.81 mg/dL — ABNORMAL HIGH (ref 0.61–1.24)
GFR calc Af Amer: 11 mL/min — ABNORMAL LOW (ref >60)
GFR calc non Af Amer: 10 mL/min — ABNORMAL LOW (ref >60)
Glucose, Bld: 84 mg/dL (ref 70–99)
Potassium: 4.1 mmol/L (ref 3.5–5.1)
Sodium: 132 mmol/L — ABNORMAL LOW (ref 135–145)

## 2019-09-30 LAB — D-DIMER, QUANTITATIVE: D-Dimer, Quant: >20 ug/mL-FEU — ABNORMAL HIGH (ref 0.00–0.50)

## 2019-09-30 LAB — GLUCOSE, CAPILLARY
Glucose-Capillary: 101 mg/dL — ABNORMAL HIGH (ref 70–99)
Glucose-Capillary: 127 mg/dL — ABNORMAL HIGH (ref 70–99)
Glucose-Capillary: 86 mg/dL (ref 70–99)
Glucose-Capillary: 121 mg/dL — ABNORMAL HIGH (ref 70–99)

## 2019-09-30 LAB — C-REACTIVE PROTEIN: CRP: 12.5 mg/dL — ABNORMAL HIGH (ref <1.0)

## 2019-09-30 LAB — BRAIN NATRIURETIC PEPTIDE: B Natriuretic Peptide: 3065.2 pg/mL — ABNORMAL HIGH (ref 0.0–100.0)

## 2019-09-30 LAB — MAGNESIUM: Magnesium: 1.6 mg/dL — ABNORMAL LOW (ref 1.7–2.4)

## 2019-09-30 MED ORDER — METOPROLOL TARTRATE 100 MG PO TABS
100.0000 mg | ORAL_TABLET | Freq: Two times a day (BID) | ORAL | Status: DC
  Administered 2019-09-30 (×2): 100 mg via ORAL
  Filled 2019-09-30 (×2): qty 1

## 2019-09-30 MED ORDER — ZOLPIDEM TARTRATE 5 MG PO TABS
5.0000 mg | ORAL_TABLET | Freq: Every evening | ORAL | Status: DC | PRN
  Administered 2019-09-30: 5 mg via ORAL
  Filled 2019-09-30: qty 1

## 2019-09-30 MED ORDER — CHLORHEXIDINE GLUCONATE CLOTH 2 % EX PADS: 6.0000 | MEDICATED_PAD | Freq: Every day | CUTANEOUS | Status: DC

## 2019-09-30 MED ORDER — MAGNESIUM SULFATE 2 GM/50ML IV SOLN
2.0000 g | Freq: Once | INTRAVENOUS | Status: AC
  Administered 2019-09-30: 2 g via INTRAVENOUS
  Filled 2019-09-30: qty 50

## 2019-09-30 MED ORDER — DEXAMETHASONE SODIUM PHOSPHATE 4 MG/ML IJ SOLN
2.0000 mg | Freq: Every day | INTRAMUSCULAR | Status: DC
  Administered 2019-09-30: 2 mg via INTRAVENOUS
  Filled 2019-09-30 (×2): qty 1

## 2019-09-30 MED ORDER — OXYCODONE HCL 5 MG PO TABS
5.0000 mg | ORAL_TABLET | Freq: Four times a day (QID) | ORAL | Status: DC | PRN
  Administered 2019-09-30 (×3): 5 mg via ORAL
  Filled 2019-09-30 (×3): qty 1

## 2019-09-30 MED ORDER — DILTIAZEM HCL ER COATED BEADS 240 MG PO CP24
240.0000 mg | ORAL_CAPSULE | Freq: Every day | ORAL | Status: DC
  Administered 2019-09-30: 240 mg via ORAL
  Filled 2019-09-30 (×3): qty 1

## 2019-09-30 NOTE — Progress Notes (Signed)
PROGRESS NOTE                                                                                                                                                                                                             Patient Demographics:    [name], is a 59 y.o. male, DOB - 1960/08/29, WCH:[id number]  Outpatient Primary MD for the patient is [name], FNP    LOS - 4  Admit date - 09/25/2019    Chief Complaint  Patient presents with  . Shortness of Breath  . Vascular Access Problem       Brief Narrative  59 year old male with prior history of ESRD on MWF iHD, Afib, CAD, ongoing tobacco abuse, COPD, pulmonary HTN, chronic cor pulmonale, CAD s/p RCA stent 2018, severe mitral stenosis, HTN, chronic hep B/ C, cirrhosis with recurrent ascites requiring serial paracentesis, anemia, hx of cocaine and marijuana abuse, who missed his dialysis and presented to the ER on 09/25/2019 with shortness of breath.  He required intubation in the ER.  He was diagnosed with acute hypoxic respiratory failure with mental status change caused by missed dialysis and fluid overload, COVID-19 pneumonia.  He also had C. difficile colitis.  Was under ICU care and stabilized and transferred to hospitalist service on 09/30/2019 on day 4 of his hospital stay.   Subjective:    Honor Loh today in bed appears to be in no distress when observed from outside the room, complains of ongoing left arm pain and wants IV Dilaudid, denies any chest pain or shortness of breath, does have chronic abdominal discomfort.  No focal weakness.   Assessment  & Plan :   1. Acute Hypoxic Resp. Failure due to Acute Covid 19 Viral Pneumonitis during the ongoing 2020 Covid 19 Pandemic - has been treated and clinically resolved.  Initially required intubation and ventilator support in the ER but mostly due to fluid overload from missed HD.   SpO2: 92 % O2 Flow  Rate (L/min): 2 L/min FiO2 (%): 30 %  Recent Labs  Lab 09/25/19 2345 09/26/19 0611 09/30/19 0744  CRP  --  9.1* 12.5*  DDIMER  --  11.34* >20.00*  BNP  --   --  3,536.1*  SARSCOV2NAA POSITIVE*  --   --      2.  ESRD.  On Tuesday, Thursday and Saturday schedule.  Noncompliant, counseled, discussed with nephrologist, will get HD per schedule. Next run Likely 10/01/2019.  3.  C. difficile colitis.  On oral vancomycin.   4.  Hx hepatitis C hepatic cirrhosis.  Requires frequent ultrasound-guided paracentesis, last fluid removal done on 09/17/2019.  Does have ascites will monitor closely.  5.  Chronic pain with narcotic seeking behavior.  Supportive care.  Despite being in no distress wants IV Dilaudid.  Politely refused.  Will be given narcotics as clinically appropriate in my evaluation.  6.  Elevated D-dimer.  Reported infiltrated IV in the left arm.  Swelling of left arm.  Will check left upper extremity venous duplex along with bilateral lower extremity.  7.  History of atrial fibrillation.  Mali vas 2 score of at least 3.  We will start him on his home dose of diltiazem and beta-blocker, not on anticoagulation at home likely due to noncompliance.  Will defer this to PCP and primary cardiologist.  8.  Essential hypertension.  For now resume combination of beta-blocker and calcium channel blocker at home dose, monitor.    Condition - Extremely Guarded  Family Communication  :  Sister [name] [phone] on 09/30/19 was called at 11:45 AM.  Full mailbox.  Code Status :  DNR  Diet :   Diet Order            Diet renal/carb modified with fluid restriction Diet-HS Snack? Nothing; Fluid restriction: 1200 mL Fluid; Room service appropriate? Yes; Fluid consistency: Thin  Diet effective now               Disposition Plan  : Stay in the hospital for treatment of C. difficile colitis, Covid pneumonia, HD for ESRD.   Consults  :  Renal, PCCM, cardiology Dr. Einar Gip    PUD Prophylaxis  : Pepcid  DVT Prophylaxis  :    Heparin   Lab Results  Component Value Date   PLT 108 (L) 09/30/2019    Inpatient Medications  Scheduled Meds: . chlorhexidine  15 mL Mouth Rinse BID  . Chlorhexidine Gluconate Cloth  6 each Topical Daily  . dexamethasone (DECADRON) injection  2 mg Intravenous Daily  . famotidine  20 mg Oral QHS  . heparin  7,500 Units Subcutaneous Q8H  . insulin aspart  0-6 Units Subcutaneous Q4H  . mouth rinse  15 mL Mouth Rinse q12n4p  . vancomycin  125 mg Oral QID   Continuous Infusions: . sodium chloride Stopped (09/29/19 1251)   PRN Meds:.acetaminophen, albuterol, lip balm, metoprolol tartrate, oxyCODONE  Antibiotics  :    Anti-infectives (From admission, onward)   Start     Dose/Rate Route Frequency Ordered Stop   09/27/19 1000  remdesivir 100 mg in sodium chloride 0.9 % 100 mL IVPB     100 mg 200 mL/hr over 30 Minutes Intravenous Daily 09/26/19 0131 09/30/19 1044   09/26/19 1400  vancomycin (VANCOCIN) 50 mg/mL oral solution 125 mg     125 mg Oral 4 times daily 09/26/19 1304 10/06/19 1559   09/26/19 0200  remdesivir 100 mg in sodium chloride 0.9 % 100 mL IVPB     100 mg 200 mL/hr over 30 Minutes Intravenous Every 30 min 09/26/19 0131 09/26/19 0441       Time Spent in minutes  30   Lala Lund M.D on 09/30/2019 at 11:36 AM  To page go to [email or web address] - password [facility]  [facility] -  Office  [phone]  See all  Orders from today for further details    Objective:   Vitals:   09/30/19 0034 09/30/19 0117 09/30/19 0236 09/30/19 0538  BP:    (!) 178/76  Pulse: (!) 102 (!) 110 67 79  Resp: 19 (!) 23 15 20   Temp:    98.6 F (37 C)  TempSrc:    Oral  SpO2: 100% 100% 95% 92%  Weight:      Height:        Wt Readings from Last 3 Encounters:  09/29/19 69.3 kg  09/23/19 63.5 kg  08/28/19 64.6 kg     Intake/Output Summary (Last 24 hours) at 09/30/2019 1136 Last data filed at 09/29/2019 1500 Gross per 24 hour  Intake 44.34 ml   Output --  Net 44.34 ml     Physical Exam  Awake Alert, No new F.N deficits, Normal affect [place].AT,PERRAL Supple Neck,No JVD, No cervical lymphadenopathy appriciated.  Symmetrical Chest wall movement, Good air movement bilaterally, CTAB RRR,No Gallops,Rubs or new Murmurs, No Parasternal Heave +ve B.Sounds, Abd Soft but does have ascites, No tenderness, No organomegaly appriciated, No rebound - guarding or rigidity. No Cyanosis, mild swelling in the left arm    Data Review:    CBC Recent Labs  Lab 09/23/19 2140 09/23/19 2140 09/25/19 2015 09/25/19 2026 09/26/19 9357 09/26/19 0708 09/26/19 0857 09/27/19 0500 09/29/19 0433 09/30/19 0356 09/30/19 0744  WBC 5.7   < > 6.1   < > 5.6  --   --  6.0 7.8 9.7 9.3  HGB 9.6*   < > 10.5*   < > 10.4*   < > 10.2* 10.8* 9.2* 9.0* 9.1*  HCT 27.9*   < > 31.0*   < > 29.6*   < > 30.0* 30.7* 26.2* 25.7* 25.8*  PLT 145*   < > 126*   < > 122*  --   --  175 105* 105* 108*  MCV 77.9*   < > 78.1*   < > 75.7*  --   --  75.2* 76.4* 74.9* 75.9*  MCH 26.8   < > 26.4   < > 26.6  --   --  26.5 26.8 26.2 26.8  MCHC 34.4   < > 33.9   < > 35.1  --   --  35.2 35.1 35.0 35.3  RDW 14.6   < > 15.0   < > 14.6  --   --  14.6 14.6 14.4 14.6  LYMPHSABS 0.9  --  0.9  --   --   --   --   --   --   --   --   MONOABS 0.3  --  0.3  --   --   --   --   --   --   --   --   EOSABS 0.1  --  0.0  --   --   --   --   --   --   --   --   BASOSABS 0.0  --  0.1  --   --   --   --   --   --   --   --    < > = values in this interval not displayed.    [place]  Lab 09/23/19 2140 09/23/19 2140 09/25/19 2015 09/25/19 2026 09/25/19 2337 09/26/19 0001 09/26/19 0107 09/26/19 0457 09/26/19 0500 09/26/19 0552 09/26/19 0708 09/27/19 0500 09/28/19 0225 09/29/19 0433 09/30/19 0356 09/30/19 0744  NA 135   < >  132*   < >  --    < >  --    < > 135  --    < > 135 136 132* 133* 132*  K 6.2*   < > >7.5*   < >  --    < >  --    < > 7.3*  --    < > 6.0* 4.8 4.1  4.1 4.1  CL 96*   < > 92*   < >  --    < >  --   --  94*  --    < > 92* 95* 94* 92* 93*  CO2 26   < > 21*  --   --    < >  --   --  23  --    < > 24 24 26 25 23   GLUCOSE 110*   < > 73   < >  --    < >  --   --  90  --    < > 124* 120* 98 89 84  BUN 49*   < > 72*   < >  --    < >  --   --  76*  --    < > 56* 33* 26* 37* 39*  CREATININE 7.96*   < > 10.76*   < >  --    < > 11.04*   < > 10.97*  --    < > 8.56* 5.43* 4.51* 5.58* 5.81*  CALCIUM 9.8   < > 9.7  --   --    < >  --   --  9.1  --    < > 8.7* 8.7* 8.6* 9.5 9.4  AST 48*  --  41  --   --   --   --   --   --   --   --   --   --   --   --   --   ALT 23  --  19  --   --   --   --   --   --   --   --   --   --   --   --   --   ALKPHOS 122  --  131*  --   --   --   --   --   --   --   --   --   --   --   --   --   BILITOT 0.4  --  0.8  --   --   --   --   --   --   --   --   --   --   --   --   --   MG  --   --   --   --   --   --   --   --  1.8  --   --  1.8 1.7 1.5*  --  1.6*  AMMONIA  --   --   --   --   --   --   --   --   --  31  --   --   --   --   --   --   INR  --   --   --   --  1.1  --   --   --   --   --   --   --   --   --   --   --  HGBA1C  --   --   --   --   --   --  4.7*  --   --   --   --   --   --   --   --   --    < > = values in this interval not displayed.     ------------------------------------------------------------------------------------------------------------------ No results for input(s): CHOL, HDL, LDLCALC, TRIG, CHOLHDL, LDLDIRECT in the last 72 hours.  Lab Results  Component Value Date   HGBA1C 4.7 (L) 09/26/2019   ------------------------------------------------------------------------------------------------------------------ No results for input(s): TSH, T4TOTAL, T3FREE, THYROIDAB in the last 72 hours.  Invalid input(s): FREET3  Cardiac Enzymes No results for input(s): CKMB, TROPONINI, MYOGLOBIN in the last 168 hours.  Invalid input(s):  CK ------------------------------------------------------------------------------------------------------------------    Component Value Date/Time   BNP 3,065.2 (H) 09/30/2019 7591    Micro Results Recent Results (from the past 240 hour(s))  Respiratory Panel by RT PCR (Flu A&B, Covid) - Nasopharyngeal Swab     Status: Abnormal   Collection Time: 09/25/19 11:45 PM   Specimen: Nasopharyngeal Swab  Result Value Ref Range Status   SARS Coronavirus 2 by RT PCR POSITIVE (A) NEGATIVE Final    Comment: RESULT CALLED TO, READ BACK BY AND VERIFIED WITH: E ROBERTSON RN 09/26/19 0100 JDW (NOTE) SARS-CoV-2 target nucleic acids are DETECTED. SARS-CoV-2 RNA is generally detectable in upper respiratory specimens  during the acute phase of infection. Positive results are indicative of the presence of the identified virus, but do not rule out bacterial infection or co-infection with other pathogens not detected by the test. Clinical correlation with patient history and other diagnostic information is necessary to determine patient infection status. The expected result is Negative. Fact Sheet for Patients:  PinkCheek.be Fact Sheet for Healthcare Providers: GravelBags.it This test is not yet approved or cleared by the [place] FDA and  has been authorized for detection and/or diagnosis of SARS-CoV-2 by FDA under an Emergency Use Authorization (EUA).  This EUA will remain in effect (meaning this test can be used) for t he duration of  the COVID-19 declaration under Section 564(b)(1) of the Act, 21 U.S.C. section 360bbb-3(b)(1), unless the authorization is terminated or revoked sooner.    Influenza A by PCR NEGATIVE NEGATIVE Final   Influenza B by PCR NEGATIVE NEGATIVE Final    Comment: (NOTE) The Xpert Xpress SARS-CoV-2/FLU/RSV assay is intended as an aid in  the diagnosis of influenza from Nasopharyngeal swab specimens and  should  not be used as a sole basis for treatment. Nasal washings and  aspirates are unacceptable for Xpert Xpress SARS-CoV-2/FLU/RSV  testing. Fact Sheet for Patients: PinkCheek.be Fact Sheet for Healthcare Providers: GravelBags.it This test is not yet approved or cleared by the [place] FDA and  has been authorized for detection and/or diagnosis of SARS-CoV-2 by  FDA under an Emergency Use Authorization (EUA). This EUA will remain  in effect (meaning this test can be used) for the duration of the  Covid-19 declaration under Section 564(b)(1) of the Act, 21  U.S.C. section 360bbb-3(b)(1), unless the authorization is  terminated or revoked. Performed at [facility], [place] [address] [place], [place] [address]   MRSA PCR Screening     Status: None   Collection Time: 09/26/19  2:34 AM   Specimen: Nasopharyngeal  Result Value Ref Range Status   MRSA by PCR NEGATIVE NEGATIVE Final    Comment:        The GeneXpert MRSA Assay (FDA  approved for NASAL specimens only), is one component of a comprehensive MRSA colonization surveillance program. It is not intended to diagnose MRSA infection nor to guide or monitor treatment for MRSA infections. Performed at [facility], [place] [address] [place], [place] [address]   C difficile quick scan w PCR reflex     Status: Abnormal   Collection Time: 09/26/19  9:30 AM   Specimen: STOOL  Result Value Ref Range Status   C Diff antigen POSITIVE (A) NEGATIVE Final   C Diff toxin NEGATIVE NEGATIVE Final   C Diff interpretation Results are indeterminate. See PCR results.  Final    Comment: Performed at [facility], [place] [address], [place], [place] [address]  C. Diff by PCR, Reflexed     Status: Abnormal   Collection Time: 09/26/19  9:30 AM  Result Value Ref Range Status   Toxigenic C. Difficile by PCR POSITIVE (A) NEGATIVE Final    Comment: Positive for toxigenic C. difficile  with little to no toxin production. Only treat if clinical presentation suggests symptomatic illness. Performed at [facility], [place] [address] [place], [place] [address]     Radiology Reports DG Chest [place] 1 View  Result Date: 09/30/2019 CLINICAL DATA:  Shortness of breath.  Abdominal distention. EXAM: PORTABLE CHEST 1 VIEW COMPARISON:  September 27, 2019 FINDINGS: Patchy interstitial and ground-glass opacities are seen in the lungs, right greater than left. Cardiomegaly. The hila and mediastinum are normal. No pneumothorax. No other acute abnormalities. IMPRESSION: Patchy interstitial and ground-glass opacities may represent asymmetric edema versus atypical infection. No other acute abnormalities. Electronically Signed   By: Dorise Bullion III M.D   On: 09/30/2019 08:43   DG Chest Port 1 View  Result Date: 09/27/2019 CLINICAL DATA:  Acute respiratory failure. EXAM: PORTABLE CHEST 1 VIEW COMPARISON:  09/26/2019. FINDINGS: Endotracheal tube, NG tube in stable position. Cardiomegaly. Persistent bilateral interstitial prominence. Findings suggest CHF with interstitial edema. Pneumonitis cannot be excluded. Low lung volumes with bibasilar atelectasis. Small left pleural effusion cannot be excluded. No pneumothorax. Aortic, bilateral subclavian, bilateral carotid atherosclerotic vascular calcification. IMPRESSION: 1.  Endotracheal tube and NG tube in stable position. 2. Cardiomegaly persistent diffuse pulmonary interstitial prominence without significant change from prior exam. Findings suggest CHF. Small left pleural effusion cannot be excluded. 3.  Low lung volumes with bibasilar atelectasis. 4. Aortic, bilateral subclavian, bilateral carotid atherosclerotic vascular disease. Electronically Signed   By: Marcello Moores  Register   On: 09/27/2019 08:18   DG Chest Port 1 View  Result Date: 09/26/2019 CLINICAL DATA:  COVID-19 virus infection. Respiratory failure. Endotracheal intubation. EXAM: PORTABLE CHEST 1  VIEW COMPARISON:  09/25/2019 FINDINGS: Endotracheal tube and orogastric tube are in appropriate position. No pneumothorax visualized. Cardiomegaly stable. Aortic atherosclerosis incidentally noted. Elevation of left hemidiaphragm is again noted, with left basilar atelectasis. Heterogeneous bilateral airspace disease shows mild worsening in central right upper lobe. No other new or worsening areas of airspace disease identified. IMPRESSION: 1. Bilateral airspace disease, with mild worsening in central right upper lobe. 2. Persistent elevation of left hemidiaphragm and left basilar atelectasis. 3. Endotracheal tube and orogastric tube in appropriate position. Electronically Signed   By: Marlaine Hind M.D.   On: 09/26/2019 09:55   DG Chest Port 1 View  Result Date: 09/25/2019 CLINICAL DATA:  Endotracheal tube and enteric tube placement. EXAM: PORTABLE CHEST 1 VIEW COMPARISON:  Earlier on the same date FINDINGS: Interval placement of an endotracheal 2, its tip projecting 6.5 cm  above the carina. Interval placement of an enteric tube which traverses the left upper abdominal quadrant in the expected location of the gastric lumen, its tip not imaged. Stable moderate cardiomegaly with vascular congestion, moderate interstitial edema, and diffuse interstitial and patchy airspace opacities in both lungs. Thoracic aorta and bilateral subclavian and axillary coarse atherosclerotic calcifications. Telemetry leads. Probable trace left pleural effusion. No pneumothorax. IMPRESSION: Interval placement of an endotracheal tube and enteric tube; a 3 cm advancement of the endotracheal tube could be considered. Stable moderate cardiomegaly with pulmonary edema, trace left pleural effusion and diffuse pulmonary disease which could be secondary to edema or infectious. Aorta and bilateral subclavian and axillary coarse atherosclerotic calcifications. Electronically Signed   By: Revonda Humphrey   On: 09/25/2019 23:34   DG Chest Port 1  View  Result Date: 09/25/2019 CLINICAL DATA:  59 year old male with cough and weakness. EXAM: PORTABLE CHEST 1 VIEW COMPARISON:  Chest radiograph dated 09/23/2019. FINDINGS: There is diffuse interstitial coarsening and mild chronic bronchitic changes. Faint bilateral mid lung field densities concerning for developing infiltrate. Clinical correlation is recommended. No large pleural effusion or pneumothorax. There is stable cardiomegaly. Atherosclerotic calcification of the aorta. No acute osseous pathology. IMPRESSION: Bilateral mid lung field densities concerning for developing infiltrate. Clinical correlation and follow-up recommended. Electronically Signed   By: Anner Crete M.D.   On: 09/25/2019 21:22   DG Chest Port 1 View  Result Date: 09/23/2019 CLINICAL DATA:  Shortness of breath and abdominal distention. EXAM: PORTABLE CHEST 1 VIEW COMPARISON:  August 10, 2019 FINDINGS: Mild chronic appearing increased interstitial lung markings are again seen. Very mild hazy infiltrates are also seen along the lateral aspect of the mid lung fields, right greater than left. This represents a new finding when compared to the prior study. There is no evidence of a pleural effusion or pneumothorax. There is marked severity cardiomegaly which is unchanged in size. Marked severity calcification of the aortic arch is seen. The visualized skeletal structures are unremarkable. IMPRESSION: 1. Chronic appearing increased interstitial lung markings with mild hazy infiltrates seen along the lateral aspects of the mid lung fields, right greater than left. Electronically Signed   By: Virgina Norfolk M.D.   On: 09/23/2019 22:04   DG Abd Portable 1V  Result Date: 09/30/2019 CLINICAL DATA:  Abdominal pain. EXAM: PORTABLE ABDOMEN - 1 VIEW COMPARISON:  None. FINDINGS: Decubitus films are identified. No free air identified. Air-filled loops of large and small bowel are normal in caliber with no evidence of obstruction.  IMPRESSION: No evidence of free air.  No evidence of obstruction identified. Electronically Signed   By: Dorise Bullion III M.D   On: 09/30/2019 08:45   IR Paracentesis  Result Date: 09/17/2019 INDICATION: Patient history of hepatitis C cirrhosis end-stage renal disease with recurrent ascites presents for therapeutic and diagnostic paracentesis EXAM: ULTRASOUND GUIDED THERAPEUTIC AND DIAGNOSTIC PARACENTESIS MEDICATIONS: Lidocaine 1% 10 mL COMPLICATIONS: None immediate. PROCEDURE: Informed written consent was obtained from the patient after a discussion of the risks, benefits and alternatives to treatment. A timeout was performed prior to the initiation of the procedure. Initial ultrasound scanning demonstrates a moderate amount of ascites within the right lower abdominal quadrant. The right lower abdomen was prepped and draped in the usual sterile fashion. 1% lidocaine was used for local anesthesia. Following this, a 19 gauge, 7-cm, Yueh catheter was introduced. An ultrasound image was saved for documentation purposes. The paracentesis was performed. The catheter was removed and a dressing was applied. The  patient tolerated the procedure well without immediate post procedural complication. FINDINGS: A total of approximately 2.8 L of straw-colored fluid was removed. Samples were sent to the laboratory as requested by the clinical team. IMPRESSION: Successful ultrasound-guided therapeutic and diagnostic paracentesis yielding 2.8 liters of peritoneal fluid. Read by Rushie Nyhan NP Electronically Signed   By: Corrie Mckusick D.O.   On: 09/17/2019 12:04

## 2019-09-30 NOTE — Plan of Care (Signed)
  Problem: Respiratory: Goal: Ability to maintain a clear airway and adequate ventilation will improve Outcome: Progressing   Problem: Role Relationship: Goal: Method of communication will improve Outcome: Progressing   

## 2019-09-30 NOTE — Progress Notes (Deleted)
Admit: 09/25/2019 LOS: 4  82M severe hyperkalemia, ESRD with noncompliance, COVID19 infection  Subjective:  Patient seen in room, wants dilaudid, c/o swelling of L arm and scrotum and abdomen.  I/O are +5.2 L since admit.   03/20 0701 - 03/21 0700 In: 286.8 [I.V.:186.8; IV Piggyback:100.1] Out: -   Filed Weights   09/28/19 1115 09/28/19 1420 09/29/19 0417  Weight: 67.5 kg 65.5 kg 69.3 kg    Scheduled Meds: . chlorhexidine  15 mL Mouth Rinse BID  . Chlorhexidine Gluconate Cloth  6 each Topical Daily  . dexamethasone (DECADRON) injection  2 mg Intravenous Daily  . diltiazem  240 mg Oral Daily  . famotidine  20 mg Oral QHS  . heparin  7,500 Units Subcutaneous Q8H  . insulin aspart  0-6 Units Subcutaneous Q4H  . mouth rinse  15 mL Mouth Rinse q12n4p  . metoprolol tartrate  100 mg Oral BID  . vancomycin  125 mg Oral QID   Continuous Infusions: . sodium chloride Stopped (09/29/19 1251)   PRN Meds:.acetaminophen, albuterol, lip balm, metoprolol tartrate, oxyCODONE, zolpidem  Current Labs: reviewed    Physical Exam:  Blood pressure (!) 178/76, pulse 79, temperature 98.6 F (37 C), temperature source Oral, resp. rate 20, height 5\' 9"  (1.753 m), weight 69.3 kg, SpO2 92 %.  Patient not examined today directly given COVID-19 + status, utilizing data taken from chart +/- discussions w/ providers and staff.    Outpt HD:[facility]WF [facility] 4h 65kg 2/2 bath 450/800 R arm AVG  Hep none  Assessment / Plan 1. ESRD-  Noncompliant. SP HD x 3 here (Tu, Th, Fri). Next HD Monday. Get vol down. If stable ok for dc tomorrow after HD.  2. Hyperkalemia- resolved w/ HD 3. HTN/ vol - vol overload w/ scrotal / LUE edema and ascites, plan max UF on HD tomorrow 4. COVID19- remdesiver and dexamethasone completed 5. Chronic HBV infection 6. Pulm HTN / cor pulmonale/ severe MS 7. PCM 8. COPD Stable labs 9. CKD-BMD 10. Anemia: Hb 9.2, get records   [name], MD 09/30/2019, 6:42 PM     Recent  Labs  Lab 09/28/19 0225 09/28/19 0225 09/29/19 0433 09/30/19 0356 09/30/19 0744  NA 136   < > 132* 133* 132*  K 4.8   < > 4.1 4.1 4.1  CL 95*   < > 94* 92* 93*  CO2 24   < > 26 25 23   GLUCOSE 120*   < > 98 89 84  BUN 33*   < > 26* 37* 39*  CREATININE 5.43*   < > 4.51* 5.58* 5.81*  CALCIUM 8.7*   < > 8.6* 9.5 9.4  PHOS 5.1*  --  3.4 4.1  --    < > = values in this interval not displayed.   Recent Labs  Lab 09/23/19 2140 09/23/19 2140 09/25/19 2015 09/25/19 2026 09/29/19 0433 09/30/19 0356 09/30/19 0744  WBC 5.7   < > 6.1   < > 7.8 9.7 9.3  NEUTROABS 4.3  --  4.7  --   --   --   --   HGB 9.6*   < > 10.5*   < > 9.2* 9.0* 9.1*  HCT 27.9*   < > 31.0*   < > 26.2* 25.7* 25.8*  MCV 77.9*   < > 78.1*   < > 76.4* 74.9* 75.9*  PLT 145*   < > 126*   < > 105* 105* 108*   < > = values in this  interval not displayed.

## 2019-09-30 NOTE — Progress Notes (Signed)
OT Cancellation Note  Patient Details Name: [name] MRN: [id number] DOB: 1960/10/15   Cancelled Treatment:    Reason Eval/Treat Not Completed: Medical issues which prohibited therapy. Attempted to see pt this afternoon. Pt with d-dimer >20.00 reporting left arm pain. Ultrasound of BUEs and BLEs still pending to rule out DVT. RN and OT in agreement to hold this date and follow up after ultrasound results reported.   Mauri Brooklyn 09/30/2019, 3:12 PM

## 2019-09-30 NOTE — Progress Notes (Addendum)
Pt c/o severe pain throughout night despite PRN medications given, pt also complaining of bloated/gas feeling and wants dialysis today if possible, NP Bl[place] was paged for pain medication but she never responded, pt states Dilaudid is more effective for him.

## 2019-09-30 NOTE — Progress Notes (Signed)
Admit: 09/25/2019 LOS: 4  57M severe hyperkalemia, ESRD with noncompliance, COVID19 infection  Subjective:  Pt seen in room, shows me swollen L arm and scrotum edema, no SOB or cough. Eating okay.     03/20 0701 - 03/21 0700 In: 286.8 [I.V.:186.8; IV Piggyback:100.1] Out: -   Filed Weights   09/28/19 1115 09/28/19 1420 09/29/19 0417  Weight: 67.5 kg 65.5 kg 69.3 kg    Scheduled Meds: . chlorhexidine  15 mL Mouth Rinse BID  . Chlorhexidine Gluconate Cloth  6 each Topical Daily  . [START ON 10/01/2019] Chlorhexidine Gluconate Cloth  6 each Topical Q0600  . dexamethasone (DECADRON) injection  2 mg Intravenous Daily  . diltiazem  240 mg Oral Daily  . famotidine  20 mg Oral QHS  . heparin  7,500 Units Subcutaneous Q8H  . insulin aspart  0-6 Units Subcutaneous Q4H  . mouth rinse  15 mL Mouth Rinse q12n4p  . metoprolol tartrate  100 mg Oral BID  . vancomycin  125 mg Oral QID   Continuous Infusions: . sodium chloride Stopped (09/29/19 1251)   PRN Meds:.acetaminophen, albuterol, lip balm, metoprolol tartrate, oxyCODONE, zolpidem  Current Labs: reviewed    Physical Exam:  Blood pressure (!) 178/76, pulse 79, temperature 98.6 F (37 C), temperature source Oral, resp. rate 20, height 5\' 9"  (1.753 m), weight 69.3 kg, SpO2 92 %.  Patient not examined today directly given COVID-19 + status, utilizing data taken from chart +/- discussions w/ providers and staff.   alert, no distress, looks better   No jvd   Chest cta bilat    Cor reg     Abd mod ascites developing    2+ scrotal edema     1-2+ LUE edema, bilat mild hip edema  Outpt HD:[facility] 4h 65kg 2/2 bath 450/800 R arm AVG Hep none  Assessment / Plan 1. ESRD-  Noncompliant. SP HD x 3 here (Tu, Th, Fri). HD tomorrow 1st shift.  2. Hyperkalemia- resolved w/ HD 3. HTN/ vol - BP's low to normal. Vol is up today w/ abd/ scrotal swelling. Wt's up 4kg. Large UF w/ HD tomorrow.  4. COVID19- remdesiver and  dexamethosone 5. Chronic HBV infection 6. Pulm HTN / cor pulmonale/ severe MS 7. PCM 8. COPD Stable labs 9. CKD-BMD 10. Anemia: Hb 9.2   [name], MD 09/30/2019, 7:51 PM     Recent Labs  Lab 09/28/19 0225 09/28/19 0225 09/29/19 0433 09/30/19 0356 09/30/19 0744  NA 136   < > 132* 133* 132*  K 4.8   < > 4.1 4.1 4.1  CL 95*   < > 94* 92* 93*  CO2 24   < > 26 25 23   GLUCOSE 120*   < > 98 89 84  BUN 33*   < > 26* 37* 39*  CREATININE 5.43*   < > 4.51* 5.58* 5.81*  CALCIUM 8.7*   < > 8.6* 9.5 9.4  PHOS 5.1*  --  3.4 4.1  --    < > = values in this interval not displayed.   Recent Labs  Lab 09/23/19 2140 09/23/19 2140 09/25/19 2015 09/25/19 2026 09/29/19 0433 09/30/19 0356 09/30/19 0744  WBC 5.7   < > 6.1   < > 7.8 9.7 9.3  NEUTROABS 4.3  --  4.7  --   --   --   --   HGB 9.6*   < > 10.5*   < > 9.2* 9.0* 9.1*  HCT 27.9*   < >  31.0*   < > 26.2* 25.7* 25.8*  MCV 77.9*   < > 78.1*   < > 76.4* 74.9* 75.9*  PLT 145*   < > 126*   < > 105* 105* 108*   < > = values in this interval not displayed.

## 2019-10-01 ENCOUNTER — Inpatient Hospital Stay (HOSPITAL_COMMUNITY): Payer: Medicare Other

## 2019-10-01 LAB — CBC WITH DIFFERENTIAL/PLATELET
Abs Immature Granulocytes: 0.07 10*3/uL (ref 0.00–0.07)
Abs Immature Granulocytes: 0.08 10*3/uL — ABNORMAL HIGH (ref 0.00–0.07)
Basophils Absolute: 0 10*3/uL (ref 0.0–0.1)
Basophils Absolute: 0 10*3/uL (ref 0.0–0.1)
Basophils Relative: 0 %
Basophils Relative: 0 %
Eosinophils Absolute: 0 10*3/uL (ref 0.0–0.5)
Eosinophils Absolute: 0 10*3/uL (ref 0.0–0.5)
Eosinophils Relative: 0 %
Eosinophils Relative: 0 %
HCT: 25.7 % — ABNORMAL LOW (ref 39.0–52.0)
HCT: 24.2 % — ABNORMAL LOW (ref 39.0–52.0)
Hemoglobin: 9.2 g/dL — ABNORMAL LOW (ref 13.0–17.0)
Hemoglobin: 8.9 g/dL — ABNORMAL LOW (ref 13.0–17.0)
Immature Granulocytes: 1 %
Immature Granulocytes: 1 %
Lymphocytes Relative: 11 %
Lymphocytes Relative: 11 %
Lymphs Abs: 1.1 10*3/uL (ref 0.7–4.0)
Lymphs Abs: 1 10*3/uL (ref 0.7–4.0)
MCH: 27.9 pg (ref 26.0–34.0)
MCH: 29.3 pg (ref 26.0–34.0)
MCHC: 35.8 g/dL (ref 30.0–36.0)
MCHC: 36.8 g/dL — ABNORMAL HIGH (ref 30.0–36.0)
MCV: 77.9 fL — ABNORMAL LOW (ref 80.0–100.0)
MCV: 79.6 fL — ABNORMAL LOW (ref 80.0–100.0)
Monocytes Absolute: 0.7 10*3/uL (ref 0.1–1.0)
Monocytes Absolute: 0.7 10*3/uL (ref 0.1–1.0)
Monocytes Relative: 7 %
Monocytes Relative: 8 %
Neutro Abs: 8 10*3/uL — ABNORMAL HIGH (ref 1.7–7.7)
Neutro Abs: 7.5 10*3/uL (ref 1.7–7.7)
Neutrophils Relative %: 81 %
Neutrophils Relative %: 80 %
Platelets: 143 10*3/uL — ABNORMAL LOW (ref 150–400)
Platelets: 129 10*3/uL — ABNORMAL LOW (ref 150–400)
RBC: 3.3 MIL/uL — ABNORMAL LOW (ref 4.22–5.81)
RBC: 3.04 MIL/uL — ABNORMAL LOW (ref 4.22–5.81)
RDW: 14.6 % (ref 11.5–15.5)
RDW: 14.6 % (ref 11.5–15.5)
WBC: 10 10*3/uL (ref 4.0–10.5)
WBC: 9.3 10*3/uL (ref 4.0–10.5)
nRBC: 0 % (ref 0.0–0.2)
nRBC: 0 % (ref 0.0–0.2)

## 2019-10-01 LAB — COMPREHENSIVE METABOLIC PANEL
ALT: 24 U/L (ref 0–44)
ALT: 24 U/L (ref 0–44)
AST: 53 U/L — ABNORMAL HIGH (ref 15–41)
AST: 49 U/L — ABNORMAL HIGH (ref 15–41)
Albumin: 2.5 g/dL — ABNORMAL LOW (ref 3.5–5.0)
Albumin: 2.5 g/dL — ABNORMAL LOW (ref 3.5–5.0)
Alkaline Phosphatase: 110 U/L (ref 38–126)
Alkaline Phosphatase: 100 U/L (ref 38–126)
Anion gap: 18 — ABNORMAL HIGH (ref 5–15)
Anion gap: 14 (ref 5–15)
BUN: 49 mg/dL — ABNORMAL HIGH (ref 6–20)
BUN: 53 mg/dL — ABNORMAL HIGH (ref 6–20)
CO2: 22 mmol/L (ref 22–32)
CO2: 23 mmol/L (ref 22–32)
Calcium: 9.1 mg/dL (ref 8.9–10.3)
Calcium: 9 mg/dL (ref 8.9–10.3)
Chloride: 93 mmol/L — ABNORMAL LOW (ref 98–111)
Chloride: 94 mmol/L — ABNORMAL LOW (ref 98–111)
Creatinine, Ser: 6.73 mg/dL — ABNORMAL HIGH (ref 0.61–1.24)
Creatinine, Ser: 7 mg/dL — ABNORMAL HIGH (ref 0.61–1.24)
GFR calc Af Amer: 10 mL/min — ABNORMAL LOW (ref >60)
GFR calc Af Amer: 9 mL/min — ABNORMAL LOW (ref >60)
GFR calc non Af Amer: 8 mL/min — ABNORMAL LOW (ref >60)
GFR calc non Af Amer: 8 mL/min — ABNORMAL LOW (ref >60)
Glucose, Bld: 70 mg/dL (ref 70–99)
Glucose, Bld: 77 mg/dL (ref 70–99)
Potassium: 4.5 mmol/L (ref 3.5–5.1)
Potassium: 4.5 mmol/L (ref 3.5–5.1)
Sodium: 133 mmol/L — ABNORMAL LOW (ref 135–145)
Sodium: 131 mmol/L — ABNORMAL LOW (ref 135–145)
Total Bilirubin: 0.6 mg/dL (ref 0.3–1.2)
Total Bilirubin: 0.6 mg/dL (ref 0.3–1.2)
Total Protein: 5.5 g/dL — ABNORMAL LOW (ref 6.5–8.1)
Total Protein: 5.5 g/dL — ABNORMAL LOW (ref 6.5–8.1)

## 2019-10-01 LAB — D-DIMER, QUANTITATIVE: D-Dimer, Quant: 15.81 ug/mL-FEU — ABNORMAL HIGH (ref 0.00–0.50)

## 2019-10-01 LAB — MAGNESIUM
Magnesium: 1.9 mg/dL (ref 1.7–2.4)
Magnesium: 2 mg/dL (ref 1.7–2.4)

## 2019-10-01 LAB — PHOSPHORUS: Phosphorus: 4.6 mg/dL (ref 2.5–4.6)

## 2019-10-01 LAB — C-REACTIVE PROTEIN: CRP: 9 mg/dL — ABNORMAL HIGH (ref <1.0)

## 2019-10-01 MED ORDER — OXYCODONE HCL 5 MG PO TABS
5.0000 mg | ORAL_TABLET | Freq: Two times a day (BID) | ORAL | Status: DC | PRN
  Administered 2019-10-01: 5 mg via ORAL
  Filled 2019-10-01: qty 1

## 2019-10-01 MED ORDER — VANCOMYCIN 50 MG/ML ORAL SOLUTION: 125.0000 mg | Freq: Four times a day (QID) | ORAL | 0 refills | Status: DC

## 2019-10-01 MED ORDER — DILTIAZEM HCL ER COATED BEADS 180 MG PO CP24
180.0000 mg | ORAL_CAPSULE | Freq: Every day | ORAL | Status: DC
  Filled 2019-10-01: qty 1

## 2019-10-01 MED ORDER — LACTULOSE 10 GM/15ML PO SOLN: 30.0000 g | Freq: Every day | ORAL | 0 refills | Status: DC | PRN

## 2019-10-01 MED ORDER — VANCOMYCIN HCL 125 MG PO CAPS: 125.0000 mg | ORAL_CAPSULE | Freq: Four times a day (QID) | ORAL | 0 refills | Status: AC

## 2019-10-01 MED FILL — LACTULOSE 10 GM/15 ML SOLN: 10 | 5 days supply | Qty: 236 | Fill #0

## 2019-10-01 MED FILL — VANCOMYCIN HCL 125 MG CAP: 125 | 7 days supply | Qty: 28 | Fill #0

## 2019-10-01 NOTE — Consult Note (Signed)
   [facility] Inpatient Consult   10/01/2019  Nkosi Cortright [facility] [birth year] [id number]     Patient on [facility], Not in current enrollment roster:  Medicare in [place]   The patient is not on the current member enrollment rosters for any of the [facility] risk contracted plans.  The payer plans send [place] this roster regularly and we have checked the list for this member.  Currently, there is no primary care provider noted in the network.  Listed with [facility] with Dustin Folks, Brooke Bonito, FNP, not a [facility] provider, confirmed by patient via hospital phone, HIPAA verified. Also spoke with MD office at [facility] and confirmed that they are in the [facility] system.  Patient did want to pass information to [facility] team he needs a home aide, named Cookie, from [employer]. Will send information to [facility] team member.  Please call for further clarification.  Will sign off.  Natividad Brood, RN BSN [facility] Liaison  [phone] business mobile phone Toll free office [phone]  Fax number: [phone] [email or web address]

## 2019-10-01 NOTE — Discharge Summary (Signed)
LEFT AMA                                                                                                                                           AMA  Patient left before venous ultrasounds could be finished, he did not want to wait.  He was being discharged anyways but we were waiting for venous ultrasound to be done to rule out left upper extremity or lower extremity DVT.  However he left before that.  Adequately want.  Patient at this time expresses desire to leave the Hospital immidiately, patient has been warned that this is not Medically advisable at this time, and can result in Medical complications like Death and Disability, patient understands and accepts the risks involved and assumes full responsibilty of this decision.     Lala Lund M.D on 10/01/2019 at 1:15 PM  [facility]  Time < 30 minutes  Last Note Below                                                                                                                                                                                    [name] JSH:[id number] DOB: Jan 26, 1961 DOA: 09/25/2019  PCP: [name], FNP  Admit date: 09/25/2019  Discharge date: 10/01/2019  Admitted From: Home   Disposition:  Home - refused SNF   Recommendations for Outpatient Follow-up:   Follow up with PCP in 1-2 weeks  PCP Please obtain BMP/CBC, 2 view CXR in 1week,  (see Discharge instructions)   PCP Please follow up on the following pending results:    Home Health: PT, RN   Equipment/Devices: walker  Consultations: PCCM, Renal Discharge Condition: Stable    CODE STATUS: Full  Diet Recommendation: Renal-low carbohydrate, 1.2 L/day total fluid restriction    Chief Complaint  Patient presents with  . Shortness of Breath  . Vascular Access Problem     Brief history of present illness from the day of admission and additional interim summary    59 year old male with prior history  of ESRD  on MWF iHD, Afib, CAD, ongoing tobacco abuse, COPD, pulmonary HTN, chronic cor pulmonale, CAD s/p RCA stent 2018, severe mitral stenosis, HTN, chronic hep B/ C, cirrhosis with recurrent ascites requiring serial paracentesis, anemia, hx of cocaine and marijuana abuse, who missed his dialysis and presented to the ER on 09/25/2019 with shortness of breath.  He required intubation in the ER.  He was diagnosed with acute hypoxic respiratory failure with mental status change caused by missed dialysis and fluid overload, COVID-19 pneumonia.  He also had C. difficile colitis.  Was under ICU care and stabilized and transferred to hospitalist service on 09/30/2019 on day 4 of his hospital stay.                                                                  Hospital Course    1. Acute Hypoxic Resp. Failure due to Acute Covid 19 Viral Pneumonitis during the ongoing 2020 Covid 19 Pandemic - has been treated and clinically resolved.  Initially required intubation and ventilator support in the ER but mostly due to fluid overload from missed HD.  He is stable on room air now will be discharged home.  He actually qualified for SNF but he refused placement.  He says he is getting a check at home and he wants to go today at any cost.  Recent Labs  Lab 09/25/19 2345 09/26/19 0611 09/30/19 0744 10/01/19 0326  CRP  --  9.1* 12.5* 9.0*  DDIMER  --  11.34* >20.00* 15.81*  BNP  --   --  4,401.0*  --   SARSCOV2NAA POSITIVE*  --   --   --     Hepatic Function Latest Ref Rng & Units 10/01/2019 10/01/2019 09/30/2019  Total Protein 6.5 - 8.1 g/dL 5.5(L) 5.5(L) -  Albumin 3.5 - 5.0 g/dL 2.5(L) 2.5(L) 2.6(L)  AST 15 - 41 U/L 49(H) 53(H) -  ALT 0 - 44 U/L 24 24 -  Alk Phosphatase 38 - 126 U/L 100 110 -  Total Bilirubin 0.3 - 1.2 mg/dL 0.6 0.6 -  Bilirubin, Direct 0.0 - 0.2 mg/dL - - -    2.  ESRD.  On Tuesday, Thursday and Saturday schedule.  Noncompliant, counseled, discussed with nephrologist, will get HD per schedule.  Next run 10/01/2019 thereafter discharged.  3.  C. difficile colitis.  On oral vancomycin, will get 1 week supply upon discharge, he will be given this prior to his home discharge.   4.  Hx hepatitis C hepatic cirrhosis.  Requires frequent ultrasound-guided paracentesis, last fluid removal done on 09/17/2019.  Does have ascites will monitor closely.  5.  Chronic pain with narcotic seeking behavior.  Supportive care.  Despite being in no distress wants IV Dilaudid.  Politely refused.  Will be given narcotics as clinically appropriate in my evaluation.  6.  Elevated D-dimer.  Reported infiltrated IV in the left arm.  Swelling of left arm.  Pending left upper extremity venous duplex along with bilateral lower extremity - will wait for the results.  7.  History of atrial fibrillation.  Mali vas 2 score of at least 3. Continue home dose diltiazem and beta-blocker, not on anticoagulation at home likely due to noncompliance.  Will defer this to PCP and primary cardiologist.  8.  Essential hypertension.    Continue home regimen and follow with PCP, strongly question his compliance with medications.   Discharge diagnosis     Active Problems:   Renal failure   Advanced care planning/counseling discussion    Discharge instructions    Discharge Instructions    Discharge instructions   Complete by: As directed    Follow with Primary MD [name], FNP in 7 days   Get CBC, CMP, 2 view Chest X ray -  checked next visit within 1 week by Primary MD    Activity: As tolerated with Full fall precautions use walker/cane & assistance as needed  Disposition Home    Diet: Renal-low carbohydrate, 1.2 L/day total fluid restriction.  Special Instructions: If you have smoked or chewed Tobacco  in the last 2 yrs please stop smoking, stop any regular Alcohol  and or any Recreational drug use.  On your next visit with your primary care physician please Get Medicines reviewed and  adjusted.  Please request your Prim.MD to go over all Hospital Tests and Procedure/Radiological results at the follow up, please get all Hospital records sent to your Prim MD by signing hospital release before you go home.  If you experience worsening of your admission symptoms, develop shortness of breath, life threatening emergency, suicidal or homicidal thoughts you must seek medical attention immediately by calling 911 or calling your MD immediately  if symptoms less severe.  You Must read complete instructions/literature along with all the possible adverse reactions/side effects for all the Medicines you take and that have been prescribed to you. Take any new Medicines after you have completely understood and accpet all the possible adverse reactions/side effects.      Discharge Medications   Allergies as of 10/01/2019      Reactions   Tramadol Itching, Other (See Comments)   Morphine    Other reaction(s): Other (See Comments)   Morphine And Related Other (See Comments)   Stomach pain   Pollen Extract    Other reaction(s): Sneezing (finding)   Acetaminophen Nausea Only   Stomach ache Other reaction(s): Other (See Comments) Stomach ache   Aspirin Other (See Comments), Itching   STOMACH PAIN Other reaction(s): Unknown STOMACH PAIN   Clonidine Itching   Clonidine Derivatives Itching      Medication List    STOP taking these medications   loperamide 2 MG tablet Commonly known as: IMODIUM A-D   sulfamethoxazole-trimethoprim 800-160 MG tablet Commonly known as: BACTRIM DS     TAKE these medications   budesonide-formoterol 160-4.5 MCG/ACT inhaler Commonly known as: SYMBICORT Inhale 2 puffs into the lungs 2 (two) times daily.   diltiazem 240 MG 24 hr capsule Commonly known as: TIAZAC Take 240 mg by mouth daily.   hydrALAZINE 100 MG tablet Commonly known as: APRESOLINE Take 100 mg by mouth 2 (two) times daily.   hydrOXYzine 25 MG tablet Commonly known as:  ATARAX/VISTARIL Take 25 mg by mouth 2 (two) times daily as needed for itching.   lactulose 10 GM/15ML solution Commonly known as: CHRONULAC Take 45 mLs (30 g total) by mouth daily as needed for mild constipation. What changed:   when to take this  reasons to take this   metoprolol tartrate 100 MG tablet Commonly known as: LOPRESSOR Take 1 tablet (100 mg total) by mouth 2 (two) times daily.   montelukast 10 MG tablet Commonly known as: SINGULAIR Take 10 mg by mouth every evening.   ondansetron 8 MG  tablet Commonly known as: ZOFRAN Take 8 mg by mouth every 4 (four) hours as needed for nausea.   ondansetron 4 MG tablet Commonly known as: ZOFRAN Take 4 mg by mouth every 4 (four) hours as needed for nausea or vomiting.   Oxycodone HCl 10 MG Tabs Take 10 mg by mouth 3 (three) times daily as needed (pain).   pantoprazole 40 MG tablet Commonly known as: PROTONIX Take 40 mg by mouth daily.   sevelamer carbonate 800 MG tablet Commonly known as: RENVELA Take 1,600-3,200 mg by mouth See admin instructions. Take 4 tablets (3200 mg) by mouth 3 times daily with meals and 2 tablets (1600 mg) with snacks   vancomycin 125 MG capsule Commonly known as: Vancocin HCl Take 1 capsule (125 mg total) by mouth 4 (four) times daily for 7 days.   Ventolin HFA 108 (90 Base) MCG/ACT inhaler Generic drug: albuterol Inhale 2 puffs into the lungs every 6 (six) hours as needed for wheezing or shortness of breath.   zolpidem 10 MG tablet Commonly known as: AMBIEN Take 10 mg by mouth at bedtime as needed for sleep.            Durable Medical Equipment  (From admission, onward)         Start     Ordered   10/01/19 1014  For home use only DME Walker rolling  Once    Question Answer Comment  Walker: With 5 Inch Wheels   Patient needs a walker to treat with the following condition Mobility impaired      10/01/19 1014          Follow-up Information    [name], FNP. Schedule  an appointment as soon as possible for a visit in 1 week(s).   Specialty: Family Medicine Contact information: [place] [address] [phone]           Major procedures and Radiology Reports - PLEASE review detailed and final reports thoroughly  -      DG Chest Port 1 View  Result Date: 09/30/2019 CLINICAL DATA:  Shortness of breath.  Abdominal distention. EXAM: PORTABLE CHEST 1 VIEW COMPARISON:  September 27, 2019 FINDINGS: Patchy interstitial and ground-glass opacities are seen in the lungs, right greater than left. Cardiomegaly. The hila and mediastinum are normal. No pneumothorax. No other acute abnormalities. IMPRESSION: Patchy interstitial and ground-glass opacities may represent asymmetric edema versus atypical infection. No other acute abnormalities. Electronically Signed   By: Dorise Bullion III M.D   On: 09/30/2019 08:43   DG Chest Port 1 View  Result Date: 09/27/2019 CLINICAL DATA:  Acute respiratory failure. EXAM: PORTABLE CHEST 1 VIEW COMPARISON:  09/26/2019. FINDINGS: Endotracheal tube, NG tube in stable position. Cardiomegaly. Persistent bilateral interstitial prominence. Findings suggest CHF with interstitial edema. Pneumonitis cannot be excluded. Low lung volumes with bibasilar atelectasis. Small left pleural effusion cannot be excluded. No pneumothorax. Aortic, bilateral subclavian, bilateral carotid atherosclerotic vascular calcification. IMPRESSION: 1.  Endotracheal tube and NG tube in stable position. 2. Cardiomegaly persistent diffuse pulmonary interstitial prominence without significant change from prior exam. Findings suggest CHF. Small left pleural effusion cannot be excluded. 3.  Low lung volumes with bibasilar atelectasis. 4. Aortic, bilateral subclavian, bilateral carotid atherosclerotic vascular disease. Electronically Signed   By: Marcello Moores  Register   On: 09/27/2019 08:18   DG Chest Port 1 View  Result Date: 09/26/2019 CLINICAL DATA:  COVID-19 virus  infection. Respiratory failure. Endotracheal intubation. EXAM: PORTABLE CHEST 1 VIEW COMPARISON:  09/25/2019 FINDINGS: Endotracheal tube and orogastric tube are in appropriate position. No pneumothorax visualized. Cardiomegaly stable. Aortic atherosclerosis incidentally noted. Elevation of left hemidiaphragm is again noted, with left basilar atelectasis. Heterogeneous bilateral airspace disease shows mild worsening in central right upper lobe. No other new or worsening areas of airspace disease identified. IMPRESSION: 1. Bilateral airspace disease, with mild worsening in central right upper lobe. 2. Persistent elevation of left hemidiaphragm and left basilar atelectasis. 3. Endotracheal tube and orogastric tube in appropriate position. Electronically Signed   By: Marlaine Hind M.D.   On: 09/26/2019 09:55   DG Chest Port 1 View  Result Date: 09/25/2019 CLINICAL DATA:  Endotracheal tube and enteric tube placement. EXAM: PORTABLE CHEST 1 VIEW COMPARISON:  Earlier on the same date FINDINGS: Interval placement of an endotracheal 2, its tip projecting 6.5 cm above the carina. Interval placement of an enteric tube which traverses the left upper abdominal quadrant in the expected location of the gastric lumen, its tip not imaged. Stable moderate cardiomegaly with vascular congestion, moderate interstitial edema, and diffuse interstitial and patchy airspace opacities in both lungs. Thoracic aorta and bilateral subclavian and axillary coarse atherosclerotic calcifications. Telemetry leads. Probable trace left pleural effusion. No pneumothorax. IMPRESSION: Interval placement of an endotracheal tube and enteric tube; a 3 cm advancement of the endotracheal tube could be considered. Stable moderate cardiomegaly with pulmonary edema, trace left pleural effusion and diffuse pulmonary disease which could be secondary to edema or infectious. Aorta and bilateral subclavian and axillary coarse atherosclerotic calcifications.  Electronically Signed   By: Revonda Humphrey   On: 09/25/2019 23:34   DG Chest Port 1 View  Result Date: 09/25/2019 CLINICAL DATA:  59 year old male with cough and weakness. EXAM: PORTABLE CHEST 1 VIEW COMPARISON:  Chest radiograph dated 09/23/2019. FINDINGS: There is diffuse interstitial coarsening and mild chronic bronchitic changes. Faint bilateral mid lung field densities concerning for developing infiltrate. Clinical correlation is recommended. No large pleural effusion or pneumothorax. There is stable cardiomegaly. Atherosclerotic calcification of the aorta. No acute osseous pathology. IMPRESSION: Bilateral mid lung field densities concerning for developing infiltrate. Clinical correlation and follow-up recommended. Electronically Signed   By: Anner Crete M.D.   On: 09/25/2019 21:22   DG Chest Port 1 View  Result Date: 09/23/2019 CLINICAL DATA:  Shortness of breath and abdominal distention. EXAM: PORTABLE CHEST 1 VIEW COMPARISON:  August 10, 2019 FINDINGS: Mild chronic appearing increased interstitial lung markings are again seen. Very mild hazy infiltrates are also seen along the lateral aspect of the mid lung fields, right greater than left. This represents a new finding when compared to the prior study. There is no evidence of a pleural effusion or pneumothorax. There is marked severity cardiomegaly which is unchanged in size. Marked severity calcification of the aortic arch is seen. The visualized skeletal structures are unremarkable. IMPRESSION: 1. Chronic appearing increased interstitial lung markings with mild hazy infiltrates seen along the lateral aspects of the mid lung fields, right greater than left. Electronically Signed   By: Virgina Norfolk M.D.   On: 09/23/2019 22:04   DG Abd Portable 1V  Result Date: 09/30/2019 CLINICAL DATA:  Abdominal pain. EXAM: PORTABLE ABDOMEN - 1 VIEW COMPARISON:  None. FINDINGS: Decubitus films are identified. No free air identified. Air-filled loops of  large and small bowel are normal in caliber with no evidence of obstruction. IMPRESSION: No evidence of free air.  No evidence of obstruction identified. Electronically Signed   By: Dorise Bullion III M.D  On: 09/30/2019 08:45   IR Paracentesis  Result Date: 09/17/2019 INDICATION: Patient history of hepatitis C cirrhosis end-stage renal disease with recurrent ascites presents for therapeutic and diagnostic paracentesis EXAM: ULTRASOUND GUIDED THERAPEUTIC AND DIAGNOSTIC PARACENTESIS MEDICATIONS: Lidocaine 1% 10 mL COMPLICATIONS: None immediate. PROCEDURE: Informed written consent was obtained from the patient after a discussion of the risks, benefits and alternatives to treatment. A timeout was performed prior to the initiation of the procedure. Initial ultrasound scanning demonstrates a moderate amount of ascites within the right lower abdominal quadrant. The right lower abdomen was prepped and draped in the usual sterile fashion. 1% lidocaine was used for local anesthesia. Following this, a 19 gauge, 7-cm, Yueh catheter was introduced. An ultrasound image was saved for documentation purposes. The paracentesis was performed. The catheter was removed and a dressing was applied. The patient tolerated the procedure well without immediate post procedural complication. FINDINGS: A total of approximately 2.8 L of straw-colored fluid was removed. Samples were sent to the laboratory as requested by the clinical team. IMPRESSION: Successful ultrasound-guided therapeutic and diagnostic paracentesis yielding 2.8 liters of peritoneal fluid. Read by Rushie Nyhan NP Electronically Signed   By: Corrie Mckusick D.O.   On: 09/17/2019 12:04    Micro Results     Recent Results (from the past 240 hour(s))  Respiratory Panel by RT PCR (Flu A&B, Covid) - Nasopharyngeal Swab     Status: Abnormal   Collection Time: 09/25/19 11:45 PM   Specimen: Nasopharyngeal Swab  Result Value Ref Range Status   SARS Coronavirus 2 by  RT PCR POSITIVE (A) NEGATIVE Final    Comment: RESULT CALLED TO, READ BACK BY AND VERIFIED WITH: E ROBERTSON RN 09/26/19 0100 JDW (NOTE) SARS-CoV-2 target nucleic acids are DETECTED. SARS-CoV-2 RNA is generally detectable in upper respiratory specimens  during the acute phase of infection. Positive results are indicative of the presence of the identified virus, but do not rule out bacterial infection or co-infection with other pathogens not detected by the test. Clinical correlation with patient history and other diagnostic information is necessary to determine patient infection status. The expected result is Negative. Fact Sheet for Patients:  PinkCheek.be Fact Sheet for Healthcare Providers: GravelBags.it This test is not yet approved or cleared by the [place] FDA and  has been authorized for detection and/or diagnosis of SARS-CoV-2 by FDA under an Emergency Use Authorization (EUA).  This EUA will remain in effect (meaning this test can be used) for t he duration of  the COVID-19 declaration under Section 564(b)(1) of the Act, 21 U.S.C. section 360bbb-3(b)(1), unless the authorization is terminated or revoked sooner.    Influenza A by PCR NEGATIVE NEGATIVE Final   Influenza B by PCR NEGATIVE NEGATIVE Final    Comment: (NOTE) The Xpert Xpress SARS-CoV-2/FLU/RSV assay is intended as an aid in  the diagnosis of influenza from Nasopharyngeal swab specimens and  should not be used as a sole basis for treatment. Nasal washings and  aspirates are unacceptable for Xpert Xpress SARS-CoV-2/FLU/RSV  testing. Fact Sheet for Patients: PinkCheek.be Fact Sheet for Healthcare Providers: GravelBags.it This test is not yet approved or cleared by the [place] FDA and  has been authorized for detection and/or diagnosis of SARS-CoV-2 by  FDA under an Emergency Use  Authorization (EUA). This EUA will remain  in effect (meaning this test can be used) for the duration of the  Covid-19 declaration under Section 564(b)(1) of the Act, 21  U.S.C. section 360bbb-3(b)(1), unless the authorization is  terminated or revoked. Performed at [facility], [place] [address] [place], [place] [address]   MRSA PCR Screening     Status: None   Collection Time: 09/26/19  2:34 AM   Specimen: Nasopharyngeal  Result Value Ref Range Status   MRSA by PCR NEGATIVE NEGATIVE Final    Comment:        The GeneXpert MRSA Assay (FDA approved for NASAL specimens only), is one component of a comprehensive MRSA colonization surveillance program. It is not intended to diagnose MRSA infection nor to guide or monitor treatment for MRSA infections. Performed at [facility], [place] [address], [place], [place] [address]   C difficile quick scan w PCR reflex     Status: Abnormal   Collection Time: 09/26/19  9:30 AM   Specimen: STOOL  Result Value Ref Range Status   C Diff antigen POSITIVE (A) NEGATIVE Final   C Diff toxin NEGATIVE NEGATIVE Final   C Diff interpretation Results are indeterminate. See PCR results.  Final    Comment: Performed at [facility], [place] [address] [place], [place] [address]  C. Diff by PCR, Reflexed     Status: Abnormal   Collection Time: 09/26/19  9:30 AM  Result Value Ref Range Status   Toxigenic C. Difficile by PCR POSITIVE (A) NEGATIVE Final    Comment: Positive for toxigenic C. difficile with little to no toxin production. Only treat if clinical presentation suggests symptomatic illness. Performed at [facility], [place] [address] [place], [place] [address]     Today   Subjective    [name] today has no headache,no chest abdominal pain,no new weakness tingling or numbness, feels much better wants to go home today.     Objective   Blood pressure (!) 138/52, pulse (!) 58, temperature 98 F (36.7 C), temperature  source Oral, resp. rate 15, height _0  (1.753 m), weight 69.5 kg, SpO2 94 %.  No intake or output data in the 24 hours ending 10/01/19 1122  Exam  Awake Alert, Oriented x 3, No new F.N deficits, Normal affect [place].AT,PERRAL Supple Neck,No JVD, No cervical lymphadenopathy appriciated.  Symmetrical Chest wall movement, Good air movement bilaterally, CTAB RRR,No Gallops,Rubs or new Murmurs, No Parasternal Heave +ve B.Sounds, Abd Soft, Non tender, No organomegaly appriciated, No rebound -guarding or rigidity. No Cyanosis, Clubbing or edema, No new Rash or bruise   Data Review   CBC w Diff:  Lab Results  Component Value Date   WBC 9.3 10/01/2019   HGB 8.9 (L) 10/01/2019   HCT 24.2 (L) 10/01/2019   PLT 129 (L) 10/01/2019   LYMPHOPCT 11 10/01/2019   MONOPCT 8 10/01/2019   EOSPCT 0 10/01/2019   BASOPCT 0 10/01/2019    CMP:  Lab Results  Component Value Date   NA 131 (L) 10/01/2019   K 4.5 10/01/2019   CL 94 (L) 10/01/2019   CO2 23 10/01/2019   BUN 53 (H) 10/01/2019   CREATININE 7.00 (H) 10/01/2019   CREATININE 9.18 (H) 02/15/2017   PROT 5.5 (L) 10/01/2019   ALBUMIN 2.5 (L) 10/01/2019   BILITOT 0.6 10/01/2019   ALKPHOS 100 10/01/2019   AST 49 (H) 10/01/2019   ALT 24 10/01/2019   ALT 39 07/22/2016  .   Total Time in preparing paper work, data evaluation and todays exam - 25 minutes  Lala Lund M.D on 10/01/2019 at [place]  [phone]

## 2019-10-01 NOTE — Progress Notes (Addendum)
OT Cancellation Note  Patient Details Name: [name] MRN: [id number] DOB: 1961/06/12   Cancelled Treatment:    Reason Eval/Diaz Not Completed: Other (comment)(pt leaving AMA. Has already signed AMA paperwork) Hoped to see pt for OT eval after HD prior to planned DC. However, pt now leaving AMA and has already signed paperwork per RN.  Layla Maw 10/01/2019, 12:58 PM

## 2019-10-01 NOTE — Progress Notes (Signed)
Admit: 09/25/2019 LOS: 5  89M severe hyperkalemia, ESRD with noncompliance, COVID19 infection  Subjective:   Patient not examined today directly given COVID-19 + status, utilizing data taken from chart +/- discussions w/ providers and staff.   In HD now   No intake/output data recorded.  Filed Weights   09/29/19 0417 10/01/19 0738 10/01/19 1215  Weight: 69.3 kg 69.5 kg 66.3 kg    Scheduled Meds: . chlorhexidine  15 mL Mouth Rinse BID  . Chlorhexidine Gluconate Cloth  6 each Topical Daily  . Chlorhexidine Gluconate Cloth  6 each Topical Q0600  . dexamethasone (DECADRON) injection  2 mg Intravenous Daily  . diltiazem  180 mg Oral Daily  . famotidine  20 mg Oral QHS  . heparin  7,500 Units Subcutaneous Q8H  . insulin aspart  0-6 Units Subcutaneous Q4H  . mouth rinse  15 mL Mouth Rinse q12n4p  . vancomycin  125 mg Oral QID   Continuous Infusions: . sodium chloride Stopped (09/29/19 1251)   PRN Meds:.acetaminophen, albuterol, lip balm, metoprolol tartrate, oxyCODONE, zolpidem  Current Labs: reviewed    Physical Exam:  Blood pressure 108/72, pulse 65, temperature (!) 97.5 F (36.4 C), temperature source Oral, resp. rate 19, height 5\' 9"  (1.753 m), weight 66.3 kg, SpO2 94 %.  Patient not examined today directly given COVID-19 + status, utilizing data taken from chart +/- discussions w/ providers and staff.    Outpt HD:M[facility] 4h 65kg 2/2 bath 450/800 R arm AVG Hep none  Assessment / Plan 1. ESRD-  Noncompliant. SP HD x 3 here (Tu, Th, Fri). HD this am, pt is on now.  2. Hyperkalemia- resolved w/ HD 3. HTN/ vol - BP's low to normal, +vol excess up 3-5kg, large UF this am  4. COVID19- remdesiver and dexamethosone 5. Chronic HBV infection 6. Pulm HTN / cor pulmonale/ severe MS 7. PCM 8. COPD Stable labs 9. CKD-BMD 10. Anemia: Hb 9.2 11. Dispo - ok for dc from renal standpoint   [name], MD 10/01/2019, 1:07 PM     Recent Labs  Lab 09/29/19 0433  09/29/19 0433 09/30/19 0356 09/30/19 0356 09/30/19 0744 10/01/19 0326 10/01/19 0926  NA 132*   < > 133*   < > 132* 133* 131*  K 4.1   < > 4.1   < > 4.1 4.5 4.5  CL 94*   < > 92*   < > 93* 93* 94*  CO2 26   < > 25   < > 23 22 23   GLUCOSE 98   < > 89   < > 84 70 77  BUN 26*   < > 37*   < > 39* 49* 53*  CREATININE 4.51*   < > 5.58*   < > 5.81* 6.73* 7.00*  CALCIUM 8.6*   < > 9.5   < > 9.4 9.1 9.0  PHOS 3.4  --  4.1  --   --  4.6  --    < > = values in this interval not displayed.   Recent Labs  Lab 09/25/19 2015 09/25/19 2026 09/30/19 0744 10/01/19 0326 10/01/19 0926  WBC 6.1   < > 9.3 10.0 9.3  NEUTROABS 4.7  --   --  8.0* 7.5  HGB 10.5*   < > 9.1* 9.2* 8.9*  HCT 31.0*   < > 25.8* 25.7* 24.2*  MCV 78.1*   < > 75.9* 77.9* 79.6*  PLT 126*   < > 108* 143* 129*   < > =  values in this interval not displayed.

## 2019-10-01 NOTE — Progress Notes (Signed)
[name] for treatment in isolation due to COVID positive status. Renal Navigator spoke with patient and his sister/[name] to inform. Schedule is TTS at 12:00pm. Clinic information and phone number as given as well as instruction to call when he arrives to the parking lot. It appears that patient will discharge today and Navigator instructed patient to start at OP HD clinic tomorrow, though he is getting HD today. He needs to switch from MWF to TTS schedule. He reports that he "can't do back to back treatments." He states he will not go tomorrow. Navigator cannot force this, but informed him that there is no other OP HD COVID shift. He reports that he will start in [place] shift on Thursday at [facility]. Patient can drive himself if strong enough/feels well enough. Sister states she will drive him as needed. Navigator notified Dr. Schertz/Nephrologist and OP HD clinic.  [name], [place] Renal Navigator [phone]

## 2019-10-01 NOTE — Care Management Important Message (Signed)
Important Message  Patient Details  Name: [name] MRN: [id number] Date of Birth: [birth year]   Medicare Important Message Given:  Yes - Important Message mailed due to current National Emergency  Verbal consent obtained due to current National Emergency  Relationship to patient: Brother/Sister Contact Name: Juwaun Inskeep Call Date: 10/01/19  Time: 1215 Phone: [phone] Outcome: Spoke with contact Important Message mailed to: Emergency contact on file    Delorse Lek 10/01/2019, 12:16 PM

## 2019-10-01 NOTE — Progress Notes (Signed)
Patient decided to leave AMA. RN explained to patient the risks of leaving AMA including death. Despite this, patient still decided to sign papers and leave AMA.

## 2019-10-01 NOTE — Discharge Instructions (Signed)
Follow with Primary MD [name], FNP in 7 days   Get CBC, CMP, 2 view Chest X ray -  checked next visit within 1 week by Primary MD    Activity: As tolerated with Full fall precautions use walker/cane & assistance as needed  Disposition Home    Diet: Renal-low carbohydrate, 1.2 L/day total fluid restriction.  Special Instructions: If you have smoked or chewed Tobacco  in the last 2 yrs please stop smoking, stop any regular Alcohol  and or any Recreational drug use.  On your next visit with your primary care physician please Get Medicines reviewed and adjusted.  Please request your Prim.MD to go over all Hospital Tests and Procedure/Radiological results at the follow up, please get all Hospital records sent to your Prim MD by signing hospital release before you go home.  If you experience worsening of your admission symptoms, develop shortness of breath, life threatening emergency, suicidal or homicidal thoughts you must seek medical attention immediately by calling 911 or calling your MD immediately  if symptoms less severe.  You Must read complete instructions/literature along with all the possible adverse reactions/side effects for all the Medicines you take and that have been prescribed to you. Take any new Medicines after you have completely understood and accpet all the possible adverse reactions/side effects.           Person Under Monitoring Name: [name]  Location: [place] [address]   Infection Prevention Recommendations for Individuals Confirmed to have, or Being Evaluated for, 2019 Novel Coronavirus (COVID-19) Infection Who Receive Care at Home  Individuals who are confirmed to have, or are being evaluated for, COVID-19 should follow the prevention steps below until a healthcare provider or local or state health department says they can return to normal activities.  Stay home except to get medical care You should  restrict activities outside your home, except for getting medical care. Do not go to work, school, or public areas, and do not use public transportation or taxis.  Call ahead before visiting your doctor Before your medical appointment, call the healthcare provider and tell them that you have, or are being evaluated for, COVID-19 infection. This will help the healthcare provider's office take steps to keep other people from getting infected. Ask your healthcare provider to call the local or state health department.  Monitor your symptoms Seek prompt medical attention if your illness is worsening (e.g., difficulty breathing). Before going to your medical appointment, call the healthcare provider and tell them that you have, or are being evaluated for, COVID-19 infection. Ask your healthcare provider to call the local or state health department.  Wear a facemask You should wear a facemask that covers your nose and mouth when you are in the same room with other people and when you visit a healthcare provider. People who live with or visit you should also wear a facemask while they are in the same room with you.  Separate yourself from other people in your home As much as possible, you should stay in a different room from other people in your home. Also, you should use a separate bathroom, if available.  Avoid sharing household items You should not share dishes, drinking glasses, cups, eating utensils, towels, bedding, or other items with other people in your home. After using these items, you should wash them thoroughly with soap and water.  Cover your coughs and sneezes Cover your mouth and nose with a  tissue when you cough or sneeze, or you can cough or sneeze into your sleeve. Throw used tissues in a lined trash can, and immediately wash your hands with soap and water for at least 20 seconds or use an alcohol-based hand rub.  Wash your [facility] your hands often and thoroughly with  soap and water for at least 20 seconds. You can use an alcohol-based hand sanitizer if soap and water are not available and if your hands are not visibly dirty. Avoid touching your eyes, nose, and mouth with unwashed hands.   Prevention Steps for Caregivers and Household Members of Individuals Confirmed to have, or Being Evaluated for, COVID-19 Infection Being Cared for in the Home  If you live with, or provide care at home for, a person confirmed to have, or being evaluated for, COVID-19 infection please follow these guidelines to prevent infection:  Follow healthcare provider's instructions Make sure that you understand and can help the patient follow any healthcare provider instructions for all care.  Provide for the patient's basic needs You should help the patient with basic needs in the home and provide support for getting groceries, prescriptions, and other personal needs.  Monitor the patient's symptoms If they are getting sicker, call his or her medical provider and tell them that the patient has, or is being evaluated for, COVID-19 infection. This will help the healthcare provider's office take steps to keep other people from getting infected. Ask the healthcare provider to call the local or state health department.  Limit the number of people who have contact with the patient  If possible, have only one caregiver for the patient.  Other household members should stay in another home or place of residence. If this is not possible, they should stay  in another room, or be separated from the patient as much as possible. Use a separate bathroom, if available.  Restrict visitors who do not have an essential need to be in the home.  Keep older adults, very young children, and other sick people away from the patient Keep older adults, very young children, and those who have compromised immune systems or chronic health conditions away from the patient. This includes people with  chronic heart, lung, or kidney conditions, diabetes, and cancer.  Ensure good ventilation Make sure that shared spaces in the home have good air flow, such as from an air conditioner or an opened window, weather permitting.  Wash your hands often  Wash your hands often and thoroughly with soap and water for at least 20 seconds. You can use an alcohol based hand sanitizer if soap and water are not available and if your hands are not visibly dirty.  Avoid touching your eyes, nose, and mouth with unwashed hands.  Use disposable paper towels to dry your hands. If not available, use dedicated cloth towels and replace them when they become wet.  Wear a facemask and gloves  Wear a disposable facemask at all times in the room and gloves when you touch or have contact with the patient's blood, body fluids, and/or secretions or excretions, such as sweat, saliva, sputum, nasal mucus, vomit, urine, or feces.  Ensure the mask fits over your nose and mouth tightly, and do not touch it during use.  Throw out disposable facemasks and gloves after using them. Do not reuse.  Wash your hands immediately after removing your facemask and gloves.  If your personal clothing becomes contaminated, carefully remove clothing and launder. Wash your hands after handling contaminated  clothing.  Place all used disposable facemasks, gloves, and other waste in a lined container before disposing them with other household waste.  Remove gloves and wash your hands immediately after handling these items.  Do not share dishes, glasses, or other household items with the patient  Avoid sharing household items. You should not share dishes, drinking glasses, cups, eating utensils, towels, bedding, or other items with a patient who is confirmed to have, or being evaluated for, COVID-19 infection.  After the person uses these items, you should wash them thoroughly with soap and water.  Wash laundry thoroughly  Immediately  remove and wash clothes or bedding that have blood, body fluids, and/or secretions or excretions, such as sweat, saliva, sputum, nasal mucus, vomit, urine, or feces, on them.  Wear gloves when handling laundry from the patient.  Read and follow directions on labels of laundry or clothing items and detergent. In general, wash and dry with the warmest temperatures recommended on the label.  Clean all areas the individual has used often  Clean all touchable surfaces, such as counters, tabletops, doorknobs, bathroom fixtures, toilets, phones, keyboards, tablets, and bedside tables, every day. Also, clean any surfaces that may have blood, body fluids, and/or secretions or excretions on them.  Wear gloves when cleaning surfaces the patient has come in contact with.  Use a diluted bleach solution (e.g., dilute bleach with 1 part bleach and 10 parts water) or a household disinfectant with a label that says EPA-registered for coronaviruses. To make a bleach solution at home, add 1 tablespoon of bleach to 1 quart (4 cups) of water. For a larger supply, add  cup of bleach to 1 gallon (16 cups) of water.  Read labels of cleaning products and follow recommendations provided on product labels. Labels contain instructions for safe and effective use of the cleaning product including precautions you should take when applying the product, such as wearing gloves or eye protection and making sure you have good ventilation during use of the product.  Remove gloves and wash hands immediately after cleaning.  Monitor yourself for signs and symptoms of illness Caregivers and household members are considered close contacts, should monitor their health, and will be asked to limit movement outside of the home to the extent possible. Follow the monitoring steps for close contacts listed on the symptom monitoring form.   ? If you have additional questions, contact your local health department or call the epidemiologist  on call at [phone] (available 24/7). ? This guidance is subject to change. For the most up-to-date guidance from [facility], please refer to their website: [email or web address].pl

## 2019-10-01 NOTE — Progress Notes (Signed)
OT Cancellation Note  Patient Details Name: [name] MRN: [id number] DOB: [birth year]   Cancelled Treatment:    Reason Eval/Treat Not Completed: Other (comment)(Pt receiving bedside HD. Will re-attempt as appropriate.)  Layla Maw 10/01/2019, 11:11 AM

## 2019-10-01 NOTE — TOC Transition Note (Addendum)
Transition of Care [facility]) - CM/SW Discharge Note   Patient Details  Name: [name] MRN: [id number] Date of Birth: February 23, 1961  [facility] CM/SW Contact:  Joshua Labrador, RN Phone Number: 10/01/2019, 10:15 AM   Clinical Narrative:   Pt deemed stable for discharge home today.  PTA pt was from home alone - therapy recommending supervision for OOB in the home setting in addition to [facility].  Pt does not have recommended supervision at home.  CM spoke with pt and sisters [name] and [name] in detail.  [name] confirms she will take pt to HD sessions until he is able to drive himself.   [name] will allow pt to stay in her home post his initial covid vaccine.  Pt can not get vaccine until 90 days after dx - CM informed pt of this.    Pt acknowledged the recommendation for supervision at discharge however  pt declined SNF and wants to discharge home with [facility].  CM gave pt choice per medicare.gov HH list - pt chose [facility] accepts for HHPT and Aide.   Pt in agreement with RW - [facility] chose [facility] - [facility] accepts.  CM informed attending of concerns - pt deemed stable for discharge today back to his home alone with [facility].  Pt in agreement with COVID at home program - application sent via email.   Sisters confirm they will help with they can.   CM contacted pts PCP FNP [name] - office to call pt directly to set up televisit for hospital follow up.    Pt informed CM that his sister [name] will pick him up from hospital.  Update:  COVID at home declined pt secondary to documented non compliance   No other CM needs determined - discharge order written.  CM awaiting medication reconciliation before signing off  Final next level of care: Home/Self Care     Patient Goals and CMS Choice   CMS Medicare.gov Compare Post Acute Care list provided to:: Patient Choice offered to / list presented to : Patient  Discharge Placement                       Discharge Plan and Services                DME Arranged: Walker rolling DME Agency: [employer] Date DME Agency Contacted: 10/08/19 Time DME Agency Contacted: [id number] Representative spoke with at DME Agency: [facility]ack HH Arranged: PT, Nurse's Aide [place] Agency: [place] Date [place]: 10/01/19 Time [place]: 1015 Representative spoke with at [place]: [place] Interventions     Readmission Risk Interventions No flowsheet data found.

## 2019-10-02 ENCOUNTER — Inpatient Hospital Stay (HOSPITAL_COMMUNITY): Admission: RE | Admit: 2019-10-02 | Payer: Medicare Other | Source: Ambulatory Visit

## 2019-10-02 ENCOUNTER — Telehealth: Payer: Self-pay | Admitting: Nephrology

## 2019-10-02 NOTE — Telephone Encounter (Signed)
Transition of care contact from inpatient facility  Date of Discharge: 10/01/19 left AMA Date of Contact: 10/02/19 Method of contact: telephone (patient actually at dialysis unit) Talked with: patient  Patient contact to discuss transition of care from recent inpatient hospitalization. Pateint was admitted to [facility] from  3/16 - 10/01/19 with the diagnosis of acute hypoxic respiratory failure, hyperkalemia, COVID   Medication changes were reviewed. No changes  Patient will follow up at outpatient dialysis - transitioned to [facility] for HD while COVID positive with PNA. He tells me he is at EDW today so doesn't think he needs to go Thursday - stressed to him it is very important that he attend all treatments as his blood needs to be cleaned - post wt here Monday was 66.3 - so EDW was not lowered but if at EDW today - needs to be lowered which patient his highly resistant to but high BP here suggests it needs to be lowered further.  Most people with COVID have unintentional weight loss.  This is complicated by his ascites that makes it difficult to know his true EDW. Will call HD unit and discuss with charge RN the need to challenge and potentially lower EDW  Other follow up needs: none - needs to go to dialysis as scheduled  Amalia Hailey, PA-C [facility] Pager:  [phone]

## 2019-10-04 ENCOUNTER — Other Ambulatory Visit: Payer: Self-pay

## 2019-10-04 ENCOUNTER — Emergency Department (HOSPITAL_COMMUNITY): Payer: Medicare Other

## 2019-10-04 ENCOUNTER — Inpatient Hospital Stay (HOSPITAL_COMMUNITY)
Admission: EM | Admit: 2019-10-04 | Discharge: 2019-10-06 | DRG: 640 | Discharge status: Against Medical Advice | Payer: Medicare Other | Attending: Internal Medicine | Admitting: Internal Medicine

## 2019-10-04 ENCOUNTER — Encounter (HOSPITAL_COMMUNITY): Payer: Self-pay | Admitting: Emergency Medicine

## 2019-10-04 DIAGNOSIS — Z800 Family history of malignant neoplasm of digestive organs: Secondary | ICD-10-CM

## 2019-10-04 DIAGNOSIS — R188 Other ascites: Secondary | ICD-10-CM | POA: Diagnosis present

## 2019-10-04 DIAGNOSIS — A0472 Enterocolitis due to Clostridium difficile, not specified as recurrent: Secondary | ICD-10-CM | POA: Diagnosis present

## 2019-10-04 DIAGNOSIS — I351 Nonrheumatic aortic (valve) insufficiency: Secondary | ICD-10-CM | POA: Diagnosis not present

## 2019-10-04 DIAGNOSIS — I2510 Atherosclerotic heart disease of native coronary artery without angina pectoris: Secondary | ICD-10-CM | POA: Diagnosis present

## 2019-10-04 DIAGNOSIS — Z808 Family history of malignant neoplasm of other organs or systems: Secondary | ICD-10-CM

## 2019-10-04 DIAGNOSIS — Z825 Family history of asthma and other chronic lower respiratory diseases: Secondary | ICD-10-CM | POA: Diagnosis not present

## 2019-10-04 DIAGNOSIS — E875 Hyperkalemia: Secondary | ICD-10-CM | POA: Diagnosis present

## 2019-10-04 DIAGNOSIS — K7460 Unspecified cirrhosis of liver: Secondary | ICD-10-CM | POA: Diagnosis present

## 2019-10-04 DIAGNOSIS — D631 Anemia in chronic kidney disease: Secondary | ICD-10-CM | POA: Diagnosis present

## 2019-10-04 DIAGNOSIS — B181 Chronic viral hepatitis B without delta-agent: Secondary | ICD-10-CM | POA: Diagnosis present

## 2019-10-04 DIAGNOSIS — Z801 Family history of malignant neoplasm of trachea, bronchus and lung: Secondary | ICD-10-CM

## 2019-10-04 DIAGNOSIS — I350 Nonrheumatic aortic (valve) stenosis: Secondary | ICD-10-CM | POA: Diagnosis not present

## 2019-10-04 DIAGNOSIS — U071 COVID-19: Secondary | ICD-10-CM

## 2019-10-04 DIAGNOSIS — I120 Hypertensive chronic kidney disease with stage 5 chronic kidney disease or end stage renal disease: Secondary | ICD-10-CM | POA: Diagnosis present

## 2019-10-04 DIAGNOSIS — I2781 Cor pulmonale (chronic): Secondary | ICD-10-CM | POA: Diagnosis present

## 2019-10-04 DIAGNOSIS — I340 Nonrheumatic mitral (valve) insufficiency: Secondary | ICD-10-CM

## 2019-10-04 DIAGNOSIS — Z66 Do not resuscitate: Secondary | ICD-10-CM | POA: Diagnosis present

## 2019-10-04 DIAGNOSIS — I480 Paroxysmal atrial fibrillation: Secondary | ICD-10-CM | POA: Diagnosis present

## 2019-10-04 DIAGNOSIS — Z992 Dependence on renal dialysis: Secondary | ICD-10-CM | POA: Diagnosis not present

## 2019-10-04 DIAGNOSIS — B1920 Unspecified viral hepatitis C without hepatic coma: Secondary | ICD-10-CM | POA: Diagnosis present

## 2019-10-04 DIAGNOSIS — K219 Gastro-esophageal reflux disease without esophagitis: Secondary | ICD-10-CM | POA: Diagnosis present

## 2019-10-04 DIAGNOSIS — N186 End stage renal disease: Secondary | ICD-10-CM | POA: Diagnosis present

## 2019-10-04 DIAGNOSIS — J9601 Acute respiratory failure with hypoxia: Secondary | ICD-10-CM

## 2019-10-04 DIAGNOSIS — Z955 Presence of coronary angioplasty implant and graft: Secondary | ICD-10-CM

## 2019-10-04 DIAGNOSIS — I342 Nonrheumatic mitral (valve) stenosis: Secondary | ICD-10-CM

## 2019-10-04 DIAGNOSIS — N2581 Secondary hyperparathyroidism of renal origin: Secondary | ICD-10-CM | POA: Diagnosis present

## 2019-10-04 DIAGNOSIS — R0902 Hypoxemia: Secondary | ICD-10-CM | POA: Diagnosis present

## 2019-10-04 DIAGNOSIS — Z885 Allergy status to narcotic agent status: Secondary | ICD-10-CM

## 2019-10-04 DIAGNOSIS — Z8616 Personal history of COVID-19: Secondary | ICD-10-CM

## 2019-10-04 DIAGNOSIS — I2729 Other secondary pulmonary hypertension: Secondary | ICD-10-CM | POA: Diagnosis present

## 2019-10-04 DIAGNOSIS — L89152 Pressure ulcer of sacral region, stage 2: Secondary | ICD-10-CM | POA: Diagnosis present

## 2019-10-04 DIAGNOSIS — E8770 Fluid overload, unspecified: Secondary | ICD-10-CM | POA: Diagnosis present

## 2019-10-04 DIAGNOSIS — Z886 Allergy status to analgesic agent status: Secondary | ICD-10-CM

## 2019-10-04 DIAGNOSIS — I252 Old myocardial infarction: Secondary | ICD-10-CM

## 2019-10-04 DIAGNOSIS — I447 Left bundle-branch block, unspecified: Secondary | ICD-10-CM | POA: Diagnosis present

## 2019-10-04 DIAGNOSIS — Z7951 Long term (current) use of inhaled steroids: Secondary | ICD-10-CM

## 2019-10-04 DIAGNOSIS — Z8249 Family history of ischemic heart disease and other diseases of the circulatory system: Secondary | ICD-10-CM | POA: Diagnosis not present

## 2019-10-04 DIAGNOSIS — F17210 Nicotine dependence, cigarettes, uncomplicated: Secondary | ICD-10-CM | POA: Diagnosis present

## 2019-10-04 DIAGNOSIS — I083 Combined rheumatic disorders of mitral, aortic and tricuspid valves: Secondary | ICD-10-CM | POA: Diagnosis present

## 2019-10-04 DIAGNOSIS — Z888 Allergy status to other drugs, medicaments and biological substances status: Secondary | ICD-10-CM

## 2019-10-04 DIAGNOSIS — Z9115 Patient's noncompliance with renal dialysis: Secondary | ICD-10-CM

## 2019-10-04 DIAGNOSIS — R7989 Other specified abnormal findings of blood chemistry: Secondary | ICD-10-CM | POA: Diagnosis not present

## 2019-10-04 DIAGNOSIS — K7031 Alcoholic cirrhosis of liver with ascites: Secondary | ICD-10-CM | POA: Diagnosis not present

## 2019-10-04 LAB — COMPREHENSIVE METABOLIC PANEL
ALT: 20 U/L (ref 0–44)
AST: 34 U/L (ref 15–41)
Albumin: 2.7 g/dL — ABNORMAL LOW (ref 3.5–5.0)
Alkaline Phosphatase: 123 U/L (ref 38–126)
Anion gap: 16 — ABNORMAL HIGH (ref 5–15)
BUN: 36 mg/dL — ABNORMAL HIGH (ref 6–20)
CO2: 26 mmol/L (ref 22–32)
Calcium: 9.4 mg/dL (ref 8.9–10.3)
Chloride: 93 mmol/L — ABNORMAL LOW (ref 98–111)
Creatinine, Ser: 6.84 mg/dL — ABNORMAL HIGH (ref 0.61–1.24)
GFR calc Af Amer: 9 mL/min — ABNORMAL LOW (ref >60)
GFR calc non Af Amer: 8 mL/min — ABNORMAL LOW (ref >60)
Glucose, Bld: 74 mg/dL (ref 70–99)
Potassium: 6.2 mmol/L — ABNORMAL HIGH (ref 3.5–5.1)
Sodium: 135 mmol/L (ref 135–145)
Total Bilirubin: 1.2 mg/dL (ref 0.3–1.2)
Total Protein: 6 g/dL — ABNORMAL LOW (ref 6.5–8.1)

## 2019-10-04 LAB — CBC
HCT: 23.8 % — ABNORMAL LOW (ref 39.0–52.0)
Hemoglobin: 8.4 g/dL — ABNORMAL LOW (ref 13.0–17.0)
MCH: 29 pg (ref 26.0–34.0)
MCHC: 35.3 g/dL (ref 30.0–36.0)
MCV: 82.1 fL (ref 80.0–100.0)
Platelets: 152 10*3/uL (ref 150–400)
RBC: 2.9 MIL/uL — ABNORMAL LOW (ref 4.22–5.81)
RDW: 16.5 % — ABNORMAL HIGH (ref 11.5–15.5)
WBC: 11.2 10*3/uL — ABNORMAL HIGH (ref 4.0–10.5)
nRBC: 0 % (ref 0.0–0.2)

## 2019-10-04 LAB — D-DIMER, QUANTITATIVE: D-Dimer, Quant: >20 ug/mL-FEU — ABNORMAL HIGH (ref 0.00–0.50)

## 2019-10-04 LAB — FIBRINOGEN: Fibrinogen: 400 mg/dL (ref 210–475)

## 2019-10-04 LAB — PROCALCITONIN: Procalcitonin: 2.05 ng/mL

## 2019-10-04 LAB — LACTATE DEHYDROGENASE: LDH: 317 U/L — ABNORMAL HIGH (ref 98–192)

## 2019-10-04 LAB — LACTIC ACID, PLASMA: Lactic Acid, Venous: 1.5 mmol/L (ref 0.5–1.9)

## 2019-10-04 LAB — PROTIME-INR
INR: 1.4 — ABNORMAL HIGH (ref 0.8–1.2)
Prothrombin Time: 16.6 seconds — ABNORMAL HIGH (ref 11.4–15.2)

## 2019-10-04 LAB — FERRITIN: Ferritin: 3045 ng/mL — ABNORMAL HIGH (ref 24–336)

## 2019-10-04 LAB — C-REACTIVE PROTEIN: CRP: 16 mg/dL — ABNORMAL HIGH (ref <1.0)

## 2019-10-04 LAB — GLUCOSE, CAPILLARY: Glucose-Capillary: 115 mg/dL — ABNORMAL HIGH (ref 70–99)

## 2019-10-04 LAB — ECHOCARDIOGRAM COMPLETE

## 2019-10-04 LAB — TRIGLYCERIDES: Triglycerides: 183 mg/dL — ABNORMAL HIGH (ref <150)

## 2019-10-04 LAB — LIPASE, BLOOD: Lipase: 17 U/L (ref 11–51)

## 2019-10-04 IMAGING — US IR PARACENTESIS
1 series · 3 of 3 positions shown · non-contrast
Comparison: none

INDICATION: History of cirrhosis. Recurrent ascites. Request for therapeutic and
diagnostic paracentesis.

[Series 1: ir (id) (id)/(id)/(id) ir · 3 of 3 slices shown]
[im 1/3]
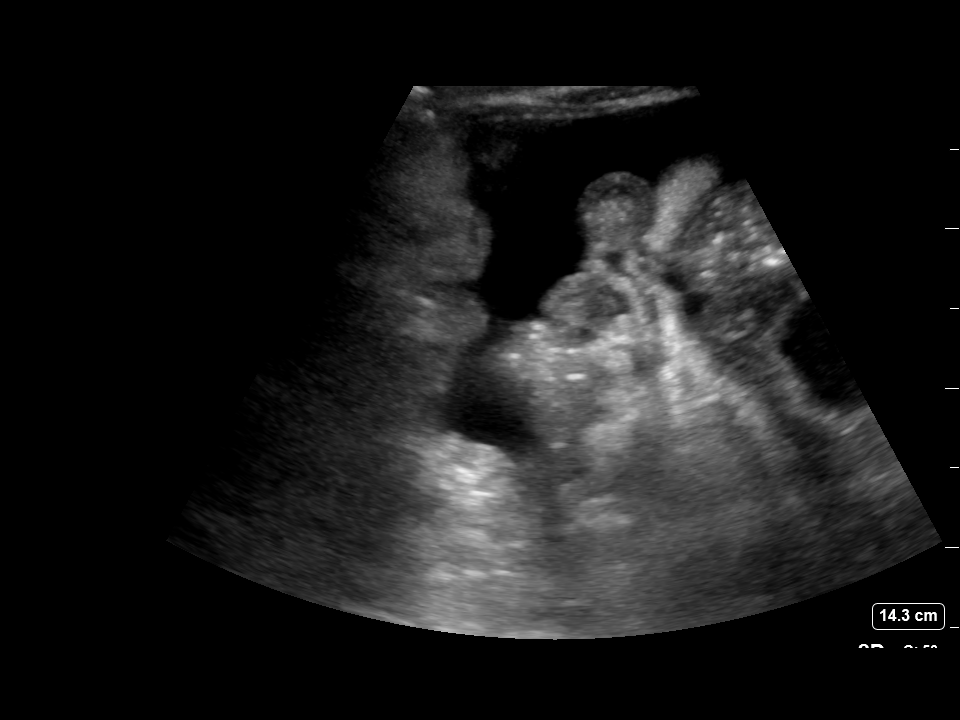
[im 2/3]
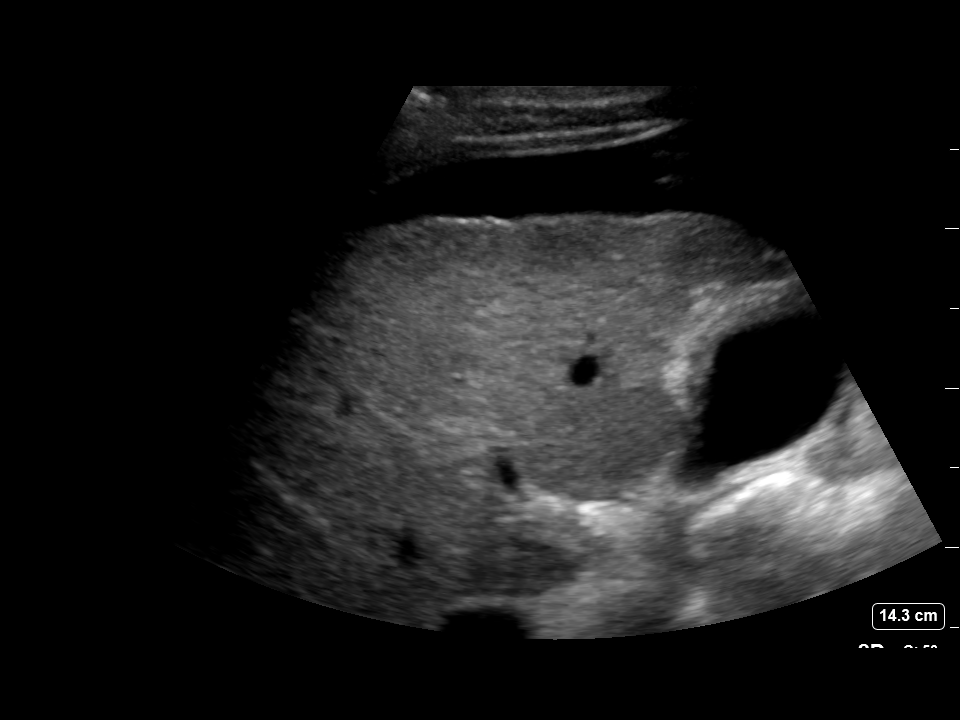
[im 3/3]
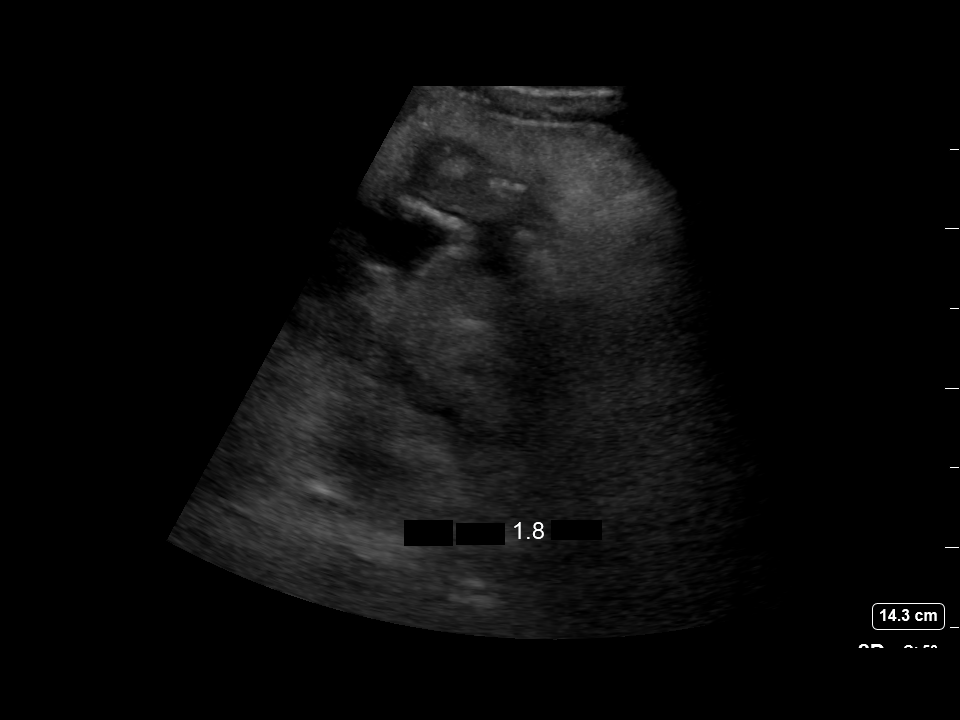

[3 of 3 positions shown; findings below may reference images not displayed]

EXAM:
ULTRASOUND GUIDED LEFT LOWER QUADRANT PARACENTESIS

MEDICATIONS:
None.

COMPLICATIONS:
None immediate.

PROCEDURE:
Informed written consent was obtained from the patient after a
discussion of the risks, benefits and alternatives to treatment. A
timeout was performed prior to the initiation of the procedure.

Initial ultrasound scanning demonstrates a moderate amount of
ascites within the left lower abdominal quadrant. The left lower
abdomen was prepped and draped in the usual sterile fashion. 1%
lidocaine with epinephrine was used for local anesthesia.

Following this, a 19 gauge, 7-cm, Yueh catheter was introduced. An
ultrasound image was saved for documentation purposes. The
paracentesis was performed. The catheter was removed and a dressing
was applied. The patient tolerated the procedure well without
immediate post procedural complication.
FINDINGS: A total of approximately 1.8 L of clear, amber color fluid was
removed.
IMPRESSION: Successful ultrasound-guided paracentesis yielding 1.8 liters of
peritoneal fluid.

## 2019-10-04 MED ORDER — SODIUM CHLORIDE 0.9 % IV SOLN: 100.0000 mL | INTRAVENOUS | Status: DC | PRN

## 2019-10-04 MED ORDER — CHLORHEXIDINE GLUCONATE CLOTH 2 % EX PADS: 6.0000 | MEDICATED_PAD | Freq: Every day | CUTANEOUS | Status: DC

## 2019-10-04 MED ORDER — OXYCODONE HCL 5 MG PO TABS
10.0000 mg | ORAL_TABLET | Freq: Three times a day (TID) | ORAL | Status: DC | PRN
  Administered 2019-10-05 – 2019-10-06 (×3): 10 mg via ORAL
  Filled 2019-10-04 (×3): qty 2

## 2019-10-04 MED ORDER — HYDRALAZINE HCL 50 MG PO TABS
100.0000 mg | ORAL_TABLET | Freq: Two times a day (BID) | ORAL | Status: DC
  Administered 2019-10-04 – 2019-10-05 (×2): 100 mg via ORAL
  Filled 2019-10-04 (×3): qty 2

## 2019-10-04 MED ORDER — HYDROXYZINE HCL 25 MG PO TABS
25.0000 mg | ORAL_TABLET | Freq: Two times a day (BID) | ORAL | Status: DC | PRN
  Administered 2019-10-04: 25 mg via ORAL
  Filled 2019-10-04: qty 1

## 2019-10-04 MED ORDER — DEXAMETHASONE SODIUM PHOSPHATE 10 MG/ML IJ SOLN
10.0000 mg | Freq: Once | INTRAMUSCULAR | Status: DC
  Filled 2019-10-04: qty 1

## 2019-10-04 MED ORDER — LACTULOSE 10 GM/15ML PO SOLN
30.0000 g | Freq: Every day | ORAL | Status: DC | PRN
  Filled 2019-10-04: qty 45

## 2019-10-04 MED ORDER — LIDOCAINE HCL (PF) 1 % IJ SOLN
5.0000 mL | INTRAMUSCULAR | Status: DC | PRN
  Filled 2019-10-04: qty 5

## 2019-10-04 MED ORDER — CALCITRIOL 0.25 MCG PO CAPS
1.0000 ug | ORAL_CAPSULE | ORAL | Status: DC
  Administered 2019-10-06: 1 ug via ORAL
  Filled 2019-10-04: qty 4

## 2019-10-04 MED ORDER — HYDROMORPHONE HCL 1 MG/ML IJ SOLN
0.5000 mg | INTRAMUSCULAR | Status: AC | PRN
  Administered 2019-10-04: 0.5 mg via INTRAVENOUS
  Filled 2019-10-04: qty 0.5

## 2019-10-04 MED ORDER — MONTELUKAST SODIUM 10 MG PO TABS
10.0000 mg | ORAL_TABLET | Freq: Every evening | ORAL | Status: DC
  Administered 2019-10-04 – 2019-10-05 (×2): 10 mg via ORAL
  Filled 2019-10-04 (×3): qty 1

## 2019-10-04 MED ORDER — ALBUMIN HUMAN 25 % IV SOLN
12.5000 g | Freq: Four times a day (QID) | INTRAVENOUS | Status: DC
  Filled 2019-10-04 (×5): qty 50

## 2019-10-04 MED ORDER — FLUTICASONE FUROATE-VILANTEROL 200-25 MCG/INH IN AEPB
1.0000 | INHALATION_SPRAY | Freq: Every day | RESPIRATORY_TRACT | Status: DC
  Administered 2019-10-05 – 2019-10-06 (×2): 1 via RESPIRATORY_TRACT
  Filled 2019-10-04: qty 28

## 2019-10-04 MED ORDER — METOCLOPRAMIDE HCL 5 MG/ML IJ SOLN: 10.0000 mg | Freq: Four times a day (QID) | INTRAMUSCULAR | Status: DC | PRN

## 2019-10-04 MED ORDER — PANTOPRAZOLE SODIUM 40 MG PO TBEC
40.0000 mg | DELAYED_RELEASE_TABLET | Freq: Every day | ORAL | Status: DC
  Administered 2019-10-05: 10:00:00 40 mg via ORAL
  Filled 2019-10-04: qty 1

## 2019-10-04 MED ORDER — GUAIFENESIN ER 600 MG PO TB12
600.0000 mg | ORAL_TABLET | Freq: Two times a day (BID) | ORAL | Status: DC
  Administered 2019-10-04 – 2019-10-06 (×5): 600 mg via ORAL
  Filled 2019-10-04 (×4): qty 1

## 2019-10-04 MED ORDER — METOPROLOL TARTRATE 100 MG PO TABS
100.0000 mg | ORAL_TABLET | Freq: Two times a day (BID) | ORAL | Status: DC
  Administered 2019-10-04 – 2019-10-06 (×2): 100 mg via ORAL
  Filled 2019-10-04 (×3): qty 1

## 2019-10-04 MED ORDER — SEVELAMER CARBONATE 800 MG PO TABS
1600.0000 mg | ORAL_TABLET | ORAL | Status: DC
  Administered 2019-10-04 – 2019-10-05 (×2): 1600 mg via ORAL
  Filled 2019-10-04 (×4): qty 2

## 2019-10-04 MED ORDER — ALBUTEROL SULFATE (2.5 MG/3ML) 0.083% IN NEBU: 2.5000 mg | INHALATION_SOLUTION | Freq: Four times a day (QID) | RESPIRATORY_TRACT | Status: DC | PRN

## 2019-10-04 MED ORDER — SEVELAMER CARBONATE 800 MG PO TABS
3200.0000 mg | ORAL_TABLET | Freq: Three times a day (TID) | ORAL | Status: DC
  Administered 2019-10-05 – 2019-10-06 (×5): 3200 mg via ORAL
  Filled 2019-10-04 (×6): qty 4

## 2019-10-04 MED ORDER — ZOLPIDEM TARTRATE 5 MG PO TABS
10.0000 mg | ORAL_TABLET | Freq: Every evening | ORAL | Status: DC | PRN
  Administered 2019-10-04: 10 mg via ORAL
  Filled 2019-10-04: qty 2

## 2019-10-04 MED ORDER — DILTIAZEM HCL ER COATED BEADS 240 MG PO CP24
240.0000 mg | ORAL_CAPSULE | Freq: Every day | ORAL | Status: DC
  Administered 2019-10-05 – 2019-10-06 (×2): 240 mg via ORAL
  Filled 2019-10-04 (×3): qty 1

## 2019-10-04 MED ORDER — LIDOCAINE-PRILOCAINE 2.5-2.5 % EX CREA
1.0000 "application " | TOPICAL_CREAM | CUTANEOUS | Status: DC | PRN
  Filled 2019-10-04: qty 5

## 2019-10-04 MED ORDER — PENTAFLUOROPROP-TETRAFLUOROETH EX AERO
1.0000 "application " | INHALATION_SPRAY | CUTANEOUS | Status: DC | PRN
  Filled 2019-10-04: qty 116

## 2019-10-04 MED ORDER — HEPARIN SODIUM (PORCINE) 5000 UNIT/ML IJ SOLN
5000.0000 [IU] | Freq: Three times a day (TID) | INTRAMUSCULAR | Status: DC
  Administered 2019-10-04 – 2019-10-05 (×3): 5000 [IU] via SUBCUTANEOUS
  Filled 2019-10-04 (×4): qty 1

## 2019-10-04 MED ORDER — ALBUMIN HUMAN 25 % IV SOLN
12.5000 g | Freq: Four times a day (QID) | INTRAVENOUS | Status: AC
  Administered 2019-10-04 – 2019-10-05 (×4): 12.5 g via INTRAVENOUS
  Filled 2019-10-04 (×3): qty 50

## 2019-10-04 MED ORDER — SODIUM CHLORIDE 0.9% FLUSH: 3.0000 mL | Freq: Once | INTRAVENOUS | Status: DC

## 2019-10-04 MED ORDER — ORAL CARE MOUTH RINSE
15.0000 mL | Freq: Two times a day (BID) | OROMUCOSAL | Status: DC
  Administered 2019-10-04 – 2019-10-06 (×4): 15 mL via OROMUCOSAL

## 2019-10-04 NOTE — Progress Notes (Signed)
RN in the ER stated he was able to establish PIV prior to IV team arrival.

## 2019-10-04 NOTE — Progress Notes (Addendum)
[facility] Progress Note  Background: patient presents back to the ED today with fever of 100 after leaving AMA 3/22 following recent COVID diagnosis and admission with related acute hypoxic respiratory failure. He had some hyperkalemia and volume excess due to missed dialysis during that admission.  He did go to dialysis for 2 of 4 hours 3/23 with a net UF of 1.1 and post wt 65.7 - edw was lowered to 64.5 for the next HD treatment as he reported SOB.  Repeat CXR reveals ? Fluid vs infection related changes.    Dialysis Orders: [facility] (currently at [place] due to COVID status) 4h 64.5kg 2/2 bath 450/800 R arm AVG Hep none calcitriol 1   Assessment/Plan: 1. COVID 19/Fevers - per primary -BC drawn  2. ESRD /hyperkalemia- TTS while COVID + K high today due to underdialysis - plan 1 hr 1 K then 2 K ; needs to consistently run full treatment 3. Anemia - hgb 8.4 - Mircera 60 given 3/23 4. Secondary hyperparathyroidism - calcitriol lowered recently due to high corrected Ca - continue 2 K 2 Ca bath 5. HTN/volume - some bradycardia - has a outpatient HD at times 6. Nutrition -renal diet/vits/needs supplements 7. Chronic Hep B - isolation dialysis 8. Pul HTN/severe MS/cor pulmonale.ascites - for paracentesis tomorrow 9. Dialysis noncompliance 10. DNR    Myriam Jacobson, PA-C [facility] Beeper [phone] 10/04/2019,3:44 PM  LOS: 0 days   Subjective:     Objective Vitals:   10/04/19 1445 10/04/19 1500 10/04/19 1515 10/04/19 1530  BP:      Pulse: 83 (!) 54 (!) 56 78  Resp: 16 (!) 22 16 14   Temp:      TempSrc:      SpO2: 98% 98% 96% 97%   Physical Exam Physical exam: unable to complete due to COVID + status.  In order to preserve PPE equipment and to minimize exposure to providers.  Notes from other caregivers reviewed  Dialysis Access: right upper AVGG   Additional Objective Labs: Basic Metabolic Panel: Recent Labs  Lab 09/29/19 0433  09/29/19 0433 09/30/19 0356 09/30/19 0744 10/01/19 0326 10/01/19 0926 10/04/19 1022  NA 132*   < > 133*   < > 133* 131* 135  K 4.1   < > 4.1   < > 4.5 4.5 6.2*  CL 94*   < > 92*   < > 93* 94* 93*  CO2 26   < > 25   < > 22 23 26   GLUCOSE 98   < > 89   < > 70 77 74  BUN 26*   < > 37*   < > 49* 53* 36*  CREATININE 4.51*   < > 5.58*   < > 6.73* 7.00* 6.84*  CALCIUM 8.6*   < > 9.5   < > 9.1 9.0 9.4  PHOS 3.4  --  4.1  --  4.6  --   --    < > = values in this interval not displayed.   Liver Function Tests: Recent Labs  Lab 10/01/19 0326 10/01/19 0926 10/04/19 1022  AST 53* 49* 34  ALT 24 24 20   ALKPHOS 110 100 123  BILITOT 0.6 0.6 1.2  PROT 5.5* 5.5* 6.0*  ALBUMIN 2.5* 2.5* 2.7*   Recent Labs  Lab 10/04/19 1022  LIPASE 17   CBC: Recent Labs  Lab 09/30/19 0356 09/30/19 0356 09/30/19 0744 09/30/19 0744 10/01/19 0326 10/01/19 0926 10/04/19 1022  WBC 9.7   < >  9.3   < > 10.0 9.3 11.2*  NEUTROABS  --   --   --   --  8.0* 7.5  --   HGB 9.0*   < > 9.1*   < > 9.2* 8.9* 8.4*  HCT 25.7*   < > 25.8*   < > 25.7* 24.2* 23.8*  MCV 74.9*  --  75.9*  --  77.9* 79.6* 82.1  PLT 105*   < > 108*   < > 143* 129* 152   < > = values in this interval not displayed.   Blood Culture    Component Value Date/Time   SDES ASCITIC FLUID 09/17/2019 1129   SPECREQUEST NONE 09/17/2019 1129   CULT  09/17/2019 1129    NO GROWTH 3 DAYS Performed at [facility], [place] [address] [place], [place] [address]    REPTSTATUS 09/20/2019 FINAL 09/17/2019 1129    Cardiac Enzymes: No results for input(s): CKTOTAL, CKMB, CKMBINDEX, TROPONINI in the last 168 hours. CBG: Recent Labs  Lab 09/29/19 2209 09/30/19 0034 09/30/19 0537 09/30/19 0942 09/30/19 1721  GLUCAP 122* 127* 86 101* 121*   Iron Studies: No results for input(s): IRON, TIBC, TRANSFERRIN, FERRITIN in the last 72 hours. Lab Results  Component Value Date   INR 1.4 (H) 10/04/2019   INR 1.1 09/25/2019   INR 1.2 07/17/2019    Studies/Results: DG Chest Portable 1 View  Result Date: 10/04/2019 CLINICAL DATA:  Hypoxia. Additional history provided: Patient from home via EMS with lower abdominal pain, had dialysis yesterday, reportedly COVID positive 316 EXAM: PORTABLE CHEST 1 VIEW COMPARISON:  Chest radiograph 09/30/2019 FINDINGS: Unchanged cardiomegaly. Aortic atherosclerosis. Chronic elevation of the left hemidiaphragm. Similar appearance of patchy interstitial opacities most notable within the bilateral mid lung fields and left lung base. No sizable pleural effusion or evidence of pneumothorax. No acute bony abnormality. IMPRESSION: Similar appearance of patchy interstitial opacities most notable within the bilateral mid lung fields and left lung base. Findings may reflect asymmetric edema or sequela of atypical/viral infection. Unchanged cardiomegaly.  Aortic atherosclerosis. Electronically Signed   By: Kellie Simmering DO   On: 10/04/2019 11:33   Medications: . albumin human     . [START ON 10/06/2019] calcitRIOL  1 mcg Oral Q T,Th,Sa-HD  . [START ON 10/05/2019] Chlorhexidine Gluconate Cloth  6 each Topical Q0600  . dexamethasone (DECADRON) injection  10 mg Intravenous Once  . [START ON 10/05/2019] diltiazem  240 mg Oral Daily  . [START ON 10/05/2019] fluticasone furoate-vilanterol  1 puff Inhalation Daily  . guaiFENesin  600 mg Oral BID  . heparin  5,000 Units Subcutaneous Q8H  . hydrALAZINE  100 mg Oral BID  . metoprolol tartrate  100 mg Oral BID  . montelukast  10 mg Oral QPM  . pantoprazole  40 mg Oral Daily  . sevelamer carbonate  1,600 mg Oral With snacks  . sevelamer carbonate  3,200 mg Oral TID with meals  . sodium chloride flush  3 mL Intravenous Once   I have seen and examined this patient and agree with plan and assessment in the above note with renal recommendations/intervention highlighted.  Pt readmitted after leaving [facility] 10/01/19.  He did go to outpatient HD on 10/02/19 but only ran for 2 hours.  With  hyperkalemia and volume overload and plan for HD today.   Broadus John A Ihan Pat,MD 10/04/2019 5:09 PM

## 2019-10-04 NOTE — H&P (Signed)
History and Physical    [name] JTT:[id number] DOB: 03/26/1961 DOA: 10/04/2019  PCP: [name], FNP (Confirm with patient/family/NH records and if not entered, this has to be entered at [facility] point of entry) Patient coming from: home  I have personally briefly reviewed patient's old medical records in [place]  Chief Complaint: Belly hurts  HPI: [name] is a 59 y.o. male with medical history significant of ESRD on MWF iHD, Afib, CAD, ongoing tobacco abuse, COPD, pulmonary HTN, chronic cor pulmonale, CAD s/p RCA stent 2018, severe mitral stenosis, HTN, chronic hep B/ C, cirrhosis with recurrent ascites requiring serial paracentesis, anemia, hx of cocaine and marijuana abuse, recent C. difficile colitis, presented with lower abdominal pain for 1 week.    Patient was recently hospitalized for respiratory failure with COVID-19 pneumonitis, fluid overload and C. difficile colitis, and he left AMA 3 days ago.  Patient claimed he continued to have lower abdominal pain sharp-like, constant, no fever chills, no diarrhea, but did have significant nauseous and decreased appetite for last 2 days.  Covid was positive on 3/16, for which patient received treatment on last admission.  Pt said they checked him for covid yesterday at dialysis and he was told it was negative.    Patient claims he does not make urine anymore, and his last paracentesis was done on 3/8.    Patient had a full session of dialysis yesterday, but continued to feel abdomen pain and distention and presented to the ER today. ED Course: Hypoxia of 88% on room air, improved to 95% on 2 L oxygen, x-ray showed signs of fluid overload, abdomen distended.  BMP showing potassium 6.2, creatinine 6.8, albumin 2.7  Review of Systems: As per HPI otherwise 10 point review of systems negative.    Past Medical History:  Diagnosis Date  . Anemia   . Anxiety   . Arthritis    left shoulder  . Atherosclerosis of  aorta ([place])   . Cardiomegaly   . Chest pain    DATE UNKNOWN, C/O PERIODICALLY  . Cocaine abuse ([place])   . COPD exacerbation ([place]) 08/17/2016  . Coronary artery disease    stent 02/22/17  . ESRD (end stage renal disease) on dialysis ([place])    "[name]; MWF" (07/04/2017)  . GERD (gastroesophageal reflux disease)    DATE UNKNOWN  . Hemorrhoids   . Hepatitis B, chronic ([place])   . Hepatitis C   . History of kidney stones   . Hyperkalemia   . Hypertension   . Kidney failure   . Metabolic bone disease    Patient denies  . Mitral stenosis   . Myocardial infarction ([place])   . Pneumonia   . Pulmonary edema   . Solitary rectal ulcer syndrome 07/2017   at flex sig for rectal bleeding  . Tubular adenoma of colon     Past Surgical History:  Procedure Laterality Date  . A/V FISTULAGRAM Left 05/26/2017   Procedure: A/V FISTULAGRAM;  Surgeon: Conrad Covington, MD;  Location: [place] [facility];  Service: Cardiovascular;  Laterality: Left;  . A/V FISTULAGRAM Right 11/18/2017   Procedure: A/V FISTULAGRAM - Right Arm;  Surgeon: Elam Dutch, MD;  Location: [place] [facility];  Service: Cardiovascular;  Laterality: Right;  . APPLICATION OF WOUND VAC Left 06/14/2017   Procedure: APPLICATION OF WOUND VAC;  Surgeon: Katha Cabal, MD;  Location: [facility];  Service: Vascular;  Laterality: Left;  . AV FISTULA  PLACEMENT  2012   BELIEVED WAS PLACED IN JUNE  . AV FISTULA PLACEMENT Right 08/09/2017   Procedure: Creation Right arm ARTERIOVENOUS BRACHIOCEPOHALIC FISTULA;  Surgeon: Elam Dutch, MD;  Location[facility];  Service: Vascular;  Laterality: Right;  . AV FISTULA PLACEMENT Right 11/22/2017   Procedure: INSERTION OF ARTERIOVENOUS (AV) GORE-TEX GRAFT RIGHT UPPER ARM;  Surgeon: Elam Dutch, MD;  Location: [place];  Service: Vascular;  Laterality: Right;  . BIOPSY  01/25/2018   Procedure: BIOPSY;  Surgeon: Jerene Bears, MD;  Location: [place];  Service: Gastroenterology;;  . BIOPSY   04/10/2019   Procedure: BIOPSY;  Surgeon: Jerene Bears, MD;  Location: [facility];  Service: Gastroenterology;;  . COLONOSCOPY    . COLONOSCOPY WITH PROPOFOL N/A 01/25/2018   Procedure: COLONOSCOPY WITH PROPOFOL;  Surgeon: Jerene Bears, MD;  Location: [place];  Service: Gastroenterology;  Laterality: N/A;  . CORONARY STENT INTERVENTION N/A 02/22/2017   Procedure: CORONARY STENT INTERVENTION;  Surgeon: Nigel Mormon, MD;  Location: [place] [facility];  Service: Cardiovascular;  Laterality: N/A;  . ESOPHAGOGASTRODUODENOSCOPY (EGD) WITH PROPOFOL N/A 01/25/2018   Procedure: ESOPHAGOGASTRODUODENOSCOPY (EGD) WITH PROPOFOL;  Surgeon: Jerene Bears, MD;  Location: [place];  Service: Gastroenterology;  Laterality: N/A;  . ESOPHAGOGASTRODUODENOSCOPY (EGD) WITH PROPOFOL N/A 04/10/2019   Procedure: ESOPHAGOGASTRODUODENOSCOPY (EGD) WITH PROPOFOL;  Surgeon: Jerene Bears, MD;  Location: [facility];  Service: Gastroenterology;  Laterality: N/A;  . FLEXIBLE SIGMOIDOSCOPY N/A 07/15/2017   Procedure: FLEXIBLE SIGMOIDOSCOPY;  Surgeon: Carol Ada, MD;  Location: [place];  Service: Endoscopy;  Laterality: N/A;  . HEMORRHOID BANDING    . I & D EXTREMITY Left 06/01/2017   Procedure: IRRIGATION AND DEBRIDEMENT LEFT ARM HEMATOMA WITH LIGATION OF LEFT ARM AV FISTULA;  Surgeon: Elam Dutch, MD;  Location: [place];  Service: Vascular;  Laterality: Left;  . I & D EXTREMITY Left 06/14/2017   Procedure: IRRIGATION AND DEBRIDEMENT EXTREMITY;  Surgeon: Katha Cabal, MD;  Location: [facility];  Service: Vascular;  Laterality: Left;  . INSERTION OF DIALYSIS CATHETER  05/30/2017  . INSERTION OF DIALYSIS CATHETER N/A 05/30/2017   Procedure: INSERTION OF DIALYSIS CATHETER;  Surgeon: Elam Dutch, MD;  Location: [place];  Service: Vascular;  Laterality: N/A;  . IR PARACENTESIS  08/30/2017  . IR PARACENTESIS  09/29/2017  . IR PARACENTESIS  10/28/2017  . IR PARACENTESIS  11/09/2017  . IR PARACENTESIS   11/16/2017  . IR PARACENTESIS  11/28/2017  . IR PARACENTESIS  12/01/2017  . IR PARACENTESIS  12/06/2017  . IR PARACENTESIS  01/03/2018  . IR PARACENTESIS  01/23/2018  . IR PARACENTESIS  02/07/2018  . IR PARACENTESIS  02/21/2018  . IR PARACENTESIS  03/06/2018  . IR PARACENTESIS  03/17/2018  . IR PARACENTESIS  04/04/2018  . IR PARACENTESIS  12/28/2018  . IR PARACENTESIS  01/08/2019  . IR PARACENTESIS  01/23/2019  . IR PARACENTESIS  02/01/2019  . IR PARACENTESIS  02/19/2019  . IR PARACENTESIS  03/01/2019  . IR PARACENTESIS  03/15/2019  . IR PARACENTESIS  04/03/2019  . IR PARACENTESIS  04/12/2019  . IR PARACENTESIS  05/01/2019  . IR PARACENTESIS  05/08/2019  . IR PARACENTESIS  05/24/2019  . IR PARACENTESIS  06/12/2019  . IR PARACENTESIS  07/09/2019  . IR PARACENTESIS  07/27/2019  . IR PARACENTESIS  08/09/2019  . IR PARACENTESIS  08/21/2019  . IR PARACENTESIS  09/17/2019  . IR RADIOLOGIST EVAL & MGMT  02/14/2018  .  IR RADIOLOGIST EVAL & MGMT  02/22/2019  . LEFT HEART CATH AND CORONARY ANGIOGRAPHY N/A 02/22/2017   Procedure: LEFT HEART CATH AND CORONARY ANGIOGRAPHY;  Surgeon: Nigel Mormon, MD;  Location: [place] [facility];  Service: Cardiovascular;  Laterality: N/A;  . LIGATION OF ARTERIOVENOUS  FISTULA Left 3/0/1601   Procedure: Plication of Left Arm Arteriovenous Fistula;  Surgeon: Elam Dutch, MD;  Location: [place];  Service: Vascular;  Laterality: Left;  . POLYPECTOMY    . POLYPECTOMY  01/25/2018   Procedure: POLYPECTOMY;  Surgeon: Jerene Bears, MD;  Location: [place];  Service: Gastroenterology;;  . REVISON OF ARTERIOVENOUS FISTULA Left 0/93/2355   Procedure: PLICATION OF DISTAL ANEURYSMAL SEGEMENT OF LEFT UPPER ARM ARTERIOVENOUS FISTULA;  Surgeon: Elam Dutch, MD;  Location: [place];  Service: Vascular;  Laterality: Left;  . REVISON OF ARTERIOVENOUS FISTULA Left 7/32/2025   Procedure: Plication of Left Upper Arm Fistula ;  Surgeon: Waynetta Sandy, MD;  Location: [place];  Service:  Vascular;  Laterality: Left;  . SKIN GRAFT SPLIT THICKNESS LEG / FOOT Left    SKIN GRAFT SPLIT THICKNESS LEFT ARM DONOR SITE: LEFT ANTERIOR THIGH  . SKIN SPLIT GRAFT Left 07/04/2017   Procedure: SKIN GRAFT SPLIT THICKNESS LEFT ARM DONOR SITE: LEFT ANTERIOR THIGH;  Surgeon: Elam Dutch, MD;  Location: [place];  Service: Vascular;  Laterality: Left;  . THROMBECTOMY W/ EMBOLECTOMY Left 06/05/2017   Procedure: EXPLORATION OF LEFT ARM FOR BLEEDING; OVERSEWED PROXIMAL FISTULA;  Surgeon: Angelia Mould, MD;  Location: [place];  Service: Vascular;  Laterality: Left;  . WOUND EXPLORATION Left 06/03/2017   Procedure: WOUND EXPLORATION WITH WOUND VAC APPLICATION TO LEFT ARM;  Surgeon: Angelia Mould, MD;  Location: [place];  Service: Vascular;  Laterality: Left;     reports that he has been smoking cigarettes. He started smoking about 46 years ago. He has a 21.50 pack-year smoking history. He has never used smokeless tobacco. He reports previous alcohol use. He reports previous drug use. Drugs: Marijuana and Cocaine.  Allergies  Allergen Reactions  . Tramadol Itching and Other (See Comments)  . Morphine     Other reaction(s): Other (See Comments)  . Morphine And Related Other (See Comments)    Stomach pain  . Pollen Extract     Other reaction(s): Sneezing (finding)  . Acetaminophen Nausea Only    Stomach ache Other reaction(s): Other (See Comments) Stomach ache  . Aspirin Other (See Comments) and Itching    STOMACH PAIN Other reaction(s): Unknown STOMACH PAIN  . Clonidine Itching  . Clonidine Derivatives Itching    Family History  Problem Relation Age of Onset  . Heart disease Mother   . Lung cancer Mother   . Heart disease Father   . Malignant hyperthermia Father   . COPD Father   . Throat cancer Sister   . Esophageal cancer Sister   . Hypertension Other   . COPD Other   . Colon cancer Neg Hx   . Colon polyps Neg Hx   . Rectal cancer Neg Hx   . Stomach cancer Neg  Hx      Prior to Admission medications   Medication Sig Start Date End Date Taking? Authorizing Provider  albuterol (VENTOLIN HFA) 108 (90 Base) MCG/ACT inhaler Inhale 2 puffs into the lungs every 6 (six) hours as needed for wheezing or shortness of breath.   Yes [provider]  budesonide-formoterol (SYMBICORT) 160-4.5 MCG/ACT inhaler Inhale 2 puffs into  the lungs 2 (two) times daily.   Yes [provider]  diltiazem (TIAZAC) 240 MG 24 hr capsule Take 240 mg by mouth daily. 08/16/19  Yes [provider]  hydrALAZINE (APRESOLINE) 100 MG tablet Take 100 mg by mouth 2 (two) times daily.   Yes [provider]  hydrOXYzine (ATARAX/VISTARIL) 25 MG tablet Take 25 mg by mouth 2 (two) times daily as needed for itching.  08/21/19  Yes [provider]  lactulose (CHRONULAC) 10 GM/15ML solution Take 45 mLs (30 g total) by mouth daily as needed for mild constipation. 10/01/19  Yes [name], MD  metoprolol tartrate (LOPRESSOR) 100 MG tablet Take 1 tablet (100 mg total) by mouth 2 (two) times daily. 06/04/19  Yes Al Decant, MD  montelukast (SINGULAIR) 10 MG tablet Take 10 mg by mouth every evening.    Yes [provider]  ondansetron (ZOFRAN) 4 MG tablet Take 4 mg by mouth every 4 (four) hours as needed for nausea or vomiting. 09/18/19  Yes [provider]  ondansetron (ZOFRAN) 8 MG tablet Take 8 mg by mouth every 4 (four) hours as needed for nausea.  06/14/19  Yes [provider]  Oxycodone HCl 10 MG TABS Take 10 mg by mouth 3 (three) times daily as needed (pain).  08/25/19  Yes [provider]  pantoprazole (PROTONIX) 40 MG tablet Take 40 mg by mouth daily.   Yes [provider]  sevelamer carbonate (RENVELA) 800 MG tablet Take 1,600-3,200 mg by mouth See admin instructions. Take 4 tablets (3200 mg) by mouth 3 times daily with meals and 2 tablets (1600 mg) with snacks   Yes [provider]  vancomycin  (VANCOCIN HCL) 125 MG capsule Take 1 capsule (125 mg total) by mouth 4 (four) times daily for 7 days. 10/01/19 10/08/19 Yes [name], MD  zolpidem (AMBIEN) 10 MG tablet Take 10 mg by mouth at bedtime as needed for sleep.   Yes [provider]    Physical Exam: Vitals:   10/04/19 1018  BP: 116/67  Pulse: 85  Resp: (!) 22  Temp: 100 F (37.8 C)  TempSrc: Oral  SpO2: (!) 82%    Constitutional: NAD, calm, comfortable Vitals:   10/04/19 1018  BP: 116/67  Pulse: 85  Resp: (!) 22  Temp: 100 F (37.8 C)  TempSrc: Oral  SpO2: (!) 82%   Eyes: PERRL, lids and conjunctivae normal ENMT: Mucous membranes are dry and significant icterus. Posterior pharynx clear of any exudate or lesions.Normal dentition.  Neck: normal, supple, no masses, no thyromegaly Respiratory: Fine crackles to the mid level bilaterally. Normal respiratory effort. No accessory muscle use.  Cardiovascular: Regular rate and rhythm, no murmurs / rubs / gallops. No extremity edema. 2+ pedal pulses. No carotid bruits.  Abdomen: no tenderness, no masses palpated. No hepatosplenomegaly. Bowel sounds positive.  Musculoskeletal: no clubbing / cyanosis. No joint deformity upper and lower extremities. Good ROM, no contractures. Normal muscle tone.  Skin: no rashes, lesions, ulcers. No induration Neurologic: CN 2-12 grossly intact. Sensation intact, DTR normal. Strength 5/5 in all 4.  Psychiatric: Normal judgment and insight. Alert and oriented x 3. Normal mood.     Labs on Admission: I have personally reviewed following labs and imaging studies  CBC: Recent Labs  Lab 09/30/19 0356 09/30/19 0744 10/01/19 0326 10/01/19 0926 10/04/19 1022  WBC 9.7 9.3 10.0 9.3 11.2*  NEUTROABS  --   --  8.0* 7.5  --   HGB 9.0* 9.1*  9.2* 8.9* 8.4*  HCT 25.7* 25.8* 25.7* 24.2* 23.8*  MCV 74.9* 75.9* 77.9* 79.6* 82.1  PLT 105* 108* 143* 129* 973   Basic Metabolic Panel: Recent Labs  Lab 09/28/19 0225 09/28/19 0225  09/29/19 0433 09/29/19 0433 09/30/19 0356 09/30/19 0744 10/01/19 0326 10/01/19 0926 10/04/19 1022  NA 136   < > 132*   < > 133* 132* 133* 131* 135  K 4.8   < > 4.1   < > 4.1 4.1 4.5 4.5 6.2*  CL 95*   < > 94*   < > 92* 93* 93* 94* 93*  CO2 24   < > 26   < > 25 23 22 23 26   GLUCOSE 120*   < > 98   < > 89 84 70 77 74  BUN 33*   < > 26*   < > 37* 39* 49* 53* 36*  CREATININE 5.43*   < > 4.51*   < > 5.58* 5.81* 6.73* 7.00* 6.84*  CALCIUM 8.7*   < > 8.6*   < > 9.5 9.4 9.1 9.0 9.4  MG 1.7  --  1.5*  --   --  1.6* 1.9 2.0  --   PHOS 5.1*  --  3.4  --  4.1  --  4.6  --   --    < > = values in this interval not displayed.   GFR: Estimated Creatinine Clearance: 11.2 mL/min (A) (by C-G formula based on SCr of 6.84 mg/dL (H)). Liver Function Tests: Recent Labs  Lab 09/29/19 0433 09/30/19 0356 10/01/19 0326 10/01/19 0926 10/04/19 1022  AST  --   --  53* 49* 34  ALT  --   --  24 24 20   ALKPHOS  --   --  110 100 123  BILITOT  --   --  0.6 0.6 1.2  PROT  --   --  5.5* 5.5* 6.0*  ALBUMIN 2.2* 2.6* 2.5* 2.5* 2.7*   Recent Labs  Lab 10/04/19 1022  LIPASE 17   No results for input(s): AMMONIA in the last 168 hours. Coagulation Profile: No results for input(s): INR, PROTIME in the last 168 hours. Cardiac Enzymes: No results for input(s): CKTOTAL, CKMB, CKMBINDEX, TROPONINI in the last 168 hours. BNP (last 3 results) No results for input(s): PROBNP in the last 8760 hours. HbA1C: No results for input(s): HGBA1C in the last 72 hours. CBG: Recent Labs  Lab 09/29/19 2209 09/30/19 0034 09/30/19 0537 09/30/19 0942 09/30/19 1721  GLUCAP 122* 127* 86 101* 121*   Lipid Profile: No results for input(s): CHOL, HDL, LDLCALC, TRIG, CHOLHDL, LDLDIRECT in the last 72 hours. Thyroid Function Tests: No results for input(s): TSH, T4TOTAL, FREET4, T3FREE, THYROIDAB in the last 72 hours. Anemia Panel: No results for input(s): VITAMINB12, FOLATE, FERRITIN, TIBC, IRON, RETICCTPCT in the last 72  hours. Urine analysis:    Component Value Date/Time   COLORURINE YELLOW 07/16/2011 1619   APPEARANCEUR CLEAR 07/16/2011 1619   LABSPEC 1.018 07/16/2011 1619   PHURINE 7.5 07/16/2011 1619   GLUCOSEU 250 (A) 07/16/2011 1619   HGBUR MODERATE (A) 07/16/2011 1619   BILIRUBINUR SMALL (A) 07/16/2011 1619   KETONESUR NEGATIVE 07/16/2011 1619   PROTEINUR >300 (A) 07/16/2011 1619   UROBILINOGEN 1.0 07/16/2011 1619   NITRITE NEGATIVE 07/16/2011 1619   LEUKOCYTESUR SMALL (A) 07/16/2011 1619    Radiological Exams on Admission: DG Chest Portable 1 View  Result Date: 10/04/2019 CLINICAL DATA:  Hypoxia. Additional history provided: Patient from home  via EMS with lower abdominal pain, had dialysis yesterday, reportedly COVID positive 316 EXAM: PORTABLE CHEST 1 VIEW COMPARISON:  Chest radiograph 09/30/2019 FINDINGS: Unchanged cardiomegaly. Aortic atherosclerosis. Chronic elevation of the left hemidiaphragm. Similar appearance of patchy interstitial opacities most notable within the bilateral mid lung fields and left lung base. No sizable pleural effusion or evidence of pneumothorax. No acute bony abnormality. IMPRESSION: Similar appearance of patchy interstitial opacities most notable within the bilateral mid lung fields and left lung base. Findings may reflect asymmetric edema or sequela of atypical/viral infection. Unchanged cardiomegaly.  Aortic atherosclerosis. Electronically Signed   By: Kellie Simmering DO   On: 10/04/2019 11:33    EKG: Independently reviewed.  Chronic LBBB, chronic QTC prolongation  Assessment/Plan Active Problems:   * No active hospital problems. *  Acute hypoxic respiratory failure -Probably multifactorial, from fluid overload, from chronic pulmonary hypertension, although there is a question of CHF, and that there is a possibility about whether patient has hepatopulmonary syndrome -Physical exam and x-ray both indicate fluid overload/pulmonary congestion.  ED physician talked to  on-call nephrology, who will come to see the patient and probably pt will benefit from a emergency dialysis today.  Will not order diuresis, since patient reported that he does not urinate more, will order echo, his most recent echo was 2 years ago -His Covid infection was sufficiently treated on last admission, will hold off further treatment -Continue his breathing meds, clinically there is no significant signs of acute COPD exacerbation  Acute abdominal pain -Probably related to C. difficile infection, suspect patient was on vancomycin -Continue p.o. vancomycin -Will order paracentesis to rule out SBP, hold off escalation of antibiotics for now  Hyperkalemia HD today  COPD As above  History of atrial fibrillation. CHADS2> 3.   But overall poor candidate for systemic anticoagulation given his liver cirrhosis condition and probably underlying coagulopathy.continue home dose diltiazem and beta-blocker, not on anticoagulation at home likely due to noncompliance. Will defer this to PCP and primary cardiologist.  HTN Continue home regimen  Chronic left bundle branch block No acute issue, continue to monitor  QTC prolongation Recheck EKG in the morning  DVT prophylaxis: Heparin subcu Code Status: DNR Family Communication: None at bedside Disposition Plan: According to discharge summary from last time, patient qualified for SNF, I will order PT evaluation, probably will need 24 to 48 hours hospital stay for symptoms management. Consults called: On-call nephrologist Admission status: Telemetry   Lequita Halt MD [facility] Pager [phone]   10/04/2019, 1:57 PM

## 2019-10-04 NOTE — Progress Notes (Signed)
  Echocardiogram 2D Echocardiogram has been performed.  [name] A Britton Perkinson 10/04/2019, 3:46 PM

## 2019-10-04 NOTE — ED Notes (Signed)
PT transported to dialysis Will go to [place] after

## 2019-10-04 NOTE — ED Notes (Signed)
Pt refused to put on gown. 

## 2019-10-04 NOTE — ED Provider Notes (Signed)
[facility] EMERGENCY DEPARTMENT Provider Note   CSN: [id number] Arrival date & time: 10/04/19  1005     History Chief Complaint  Patient presents with  . Abdominal Pain    [name] is a 59 y.o. male.  Pt presents to the ED today with lower abdominal pain.  Pt wants a paracentesis.  Pt just left AMA on 3/22 from an admission with acute resp failure requiring intubation from Covid and fluid overload from missed HD.  Pt tested + for covid on 3/16.  Pt said they checked him for covid yesterday at dialysis and he was told it was negative.  Pt does have a requirement for frequent US guided paracentesis.  His last fluid removal was done on 3/8.  Pt did also have cdiff while in the hospital.  He denies sob, but his RA O2 sat was 88% on RA.  After 2L, oxygen is 98%.         Past Medical History:  Diagnosis Date  . Anemia   . Anxiety   . Arthritis    left shoulder  . Atherosclerosis of aorta ([place])   . Cardiomegaly   . Chest pain    DATE UNKNOWN, C/O PERIODICALLY  . Cocaine abuse ([place])   . COPD exacerbation ([place]) 08/17/2016  . Coronary artery disease    stent 02/22/17  . ESRD (end stage renal disease) on dialysis ([place])    "[name]; MWF" (07/04/2017)  . GERD (gastroesophageal reflux disease)    DATE UNKNOWN  . Hemorrhoids   . Hepatitis B, chronic ([place])   . Hepatitis C   . History of kidney stones   . Hyperkalemia   . Hypertension   . Kidney failure   . Metabolic bone disease    Patient denies  . Mitral stenosis   . Myocardial infarction ([place])   . Pneumonia   . Pulmonary edema   . Solitary rectal ulcer syndrome 07/2017   at [facility] for rectal bleeding  . Tubular adenoma of colon     Patient Active Problem List   Diagnosis Date Noted  . Advanced care planning/counseling discussion   . Renal failure 09/26/2019  . Dyspnea 06/09/2019  . Acquired thrombophilia ([place]) 06/05/2019  . A-fib ([place]) 05/30/2019  . Atrial fibrillation with RVR  ([place]) 05/29/2019  . Melena   . Pressure injury of skin 03/09/2019  . Abdominal distention   . Volume overload 12/28/2018  . Sepsis ([place]) 09/12/2018  . Atherosclerosis of native coronary artery of native heart without angina pectoris 03/11/2018  . Benign neoplasm of cecum   . Benign neoplasm of ascending colon   . Benign neoplasm of descending colon   . Benign neoplasm of rectum   . Paroxysmal atrial fibrillation ([place]) 01/23/2018  . Hx of colonic polyps 01/20/2018  . End-stage renal disease on hemodialysis ([place]) 11/21/2017  . GERD (gastroesophageal reflux disease) 11/16/2017  . Decompensated hepatic cirrhosis ([place]) 11/15/2017  . DNR (do not resuscitate)   . Palliative care by specialist   . Hyponatremia 11/04/2017  . SBP (spontaneous bacterial peritonitis) ([place]) 10/30/2017  . Liver disease, chronic 10/30/2017  . SOB (shortness of breath)   . Abdominal pain 10/28/2017  . Upper airway cough syndrome with flattening on f/v loop 10/13/17 c/w vcd 10/17/2017  . Elevated diaphragm 10/13/2017  . Ileus ([place]) 09/29/2017  . QT prolongation 09/29/2017  . Malnutrition of moderate degree 09/29/2017  . Sinus congestion 09/03/2017  . Symptomatic anemia 09/02/2017  . Other cirrhosis  of liver ([place]) 09/02/2017  . Left bundle branch block 09/02/2017  . Mitral stenosis 09/02/2017  . Hematochezia 07/15/2017  . Wide-complex tachycardia ([place])   . Endotracheally intubated   . ESRD on dialysis ([place]) 07/04/2017  . Acute respiratory failure with hypoxia ([place]) 06/18/2017  . CKD (chronic kidney disease) stage V requiring chronic dialysis ([place]) 06/18/2017  . History of Cocaine abuse ([place]) 06/18/2017  . Hypertension 06/18/2017  . Infection of AV graft for dialysis ([place]) 06/18/2017  . Anxiety 06/18/2017  . Anemia due to chronic kidney disease 06/18/2017  . Atypical atrial flutter ([place]) 06/18/2017  . Personality disorder ([place]) 06/13/2017  . Cellulitis 06/12/2017  . Adjustment disorder with mixed anxiety  and depressed mood 06/10/2017  . Suicidal ideation 06/10/2017  . Arm wound, left, sequela 06/10/2017  . Dyspnea on exertion 05/29/2017  . Tachycardia 05/29/2017  . Hyperkalemia 05/22/2017  . Acute metabolic encephalopathy   . Anemia 04/23/2017  . Ascites 04/23/2017  . COPD (chronic obstructive pulmonary disease) ([place]) 04/23/2017  . Acute on chronic respiratory failure with hypoxia ([place]) 03/25/2017  . Arrhythmia 03/25/2017  . COPD GOLD 0 with flattening on inps f/v  09/27/2016  . Essential hypertension 09/27/2016  . Fluid overload 08/30/2016  . COPD exacerbation ([place]) 08/17/2016  . Hypertensive urgency 08/17/2016  . Respiratory failure ([place]) 08/17/2016  . Problem with dialysis access ([place]) 07/23/2016  . Chronic hepatitis B ([place]) 03/05/2014  . Chronic hepatitis C without hepatic coma ([place]) 03/05/2014  . Internal hemorrhoids with bleeding, swelling and itching 03/05/2014  . Thrombocytopenia ([place]) 03/05/2014  . Chest pain 02/27/2014  . Alcohol abuse 04/14/2009  . Cigarette smoker 04/14/2009  . GANGLION CYST 04/14/2009    Past Surgical History:  Procedure Laterality Date  . A/V FISTULAGRAM Left 05/26/2017   Procedure: A/V FISTULAGRAM;  Surgeon: Conrad College Park, MD;  Location: [facility];  Service: Cardiovascular;  Laterality: Left;  . A/V FISTULAGRAM Right 11/18/2017   Procedure: A/V FISTULAGRAM - Right Arm;  Surgeon: Elam Dutch, MD;  Location: [facility];  Service: Cardiovascular;  Laterality: Right;  . APPLICATION OF WOUND VAC Left 06/14/2017   Procedure: APPLICATION OF WOUND VAC;  Surgeon: Katha Cabal, MD;  Location: [facility];  Service: Vascular;  Laterality: Left;  . AV FISTULA PLACEMENT  2012   BELIEVED WAS PLACED IN JUNE  . AV FISTULA PLACEMENT Right 08/09/2017   Procedure: Creation Right arm ARTERIOVENOUS BRACHIOCEPOHALIC FISTULA;  Surgeon: Elam Dutch, MD;  Location: [facility] OR;  Service: Vascular;  Laterality: Right;  . AV FISTULA PLACEMENT Right  11/22/2017   Procedure: INSERTION OF ARTERIOVENOUS (AV) GORE-TEX GRAFT RIGHT UPPER ARM;  Surgeon: Elam Dutch, MD;  Location: [facility];  Service: Vascular;  Laterality: Right;  . BIOPSY  01/25/2018   Procedure: BIOPSY;  Surgeon: Jerene Bears, MD;  Location: [facility];  Service: Gastroenterology;;  . BIOPSY  04/10/2019   Procedure: BIOPSY;  Surgeon: Jerene Bears, MD;  Location: [facility];  Service: Gastroenterology;;  . COLONOSCOPY    . COLONOSCOPY WITH PROPOFOL N/A 01/25/2018   Procedure: COLONOSCOPY WITH PROPOFOL;  Surgeon: Jerene Bears, MD;  Location: [facility];  Service: Gastroenterology;  Laterality: N/A;  . CORONARY STENT INTERVENTION N/A 02/22/2017   Procedure: CORONARY STENT INTERVENTION;  Surgeon: Nigel Mormon, MD;  Location: [facility];  Service: Cardiovascular;  Laterality: N/A;  . ESOPHAGOGASTRODUODENOSCOPY (EGD) WITH PROPOFOL N/A 01/25/2018   Procedure: ESOPHAGOGASTRODUODENOSCOPY (EGD) WITH PROPOFOL;  Surgeon: Jerene Bears,  MD;  Location: [facility];  Service: Gastroenterology;  Laterality: N/A;  . ESOPHAGOGASTRODUODENOSCOPY (EGD) WITH PROPOFOL N/A 04/10/2019   Procedure: ESOPHAGOGASTRODUODENOSCOPY (EGD) WITH PROPOFOL;  Surgeon: Jerene Bears, MD;  [facility];  Service: Gastroenterology;  Laterality: N/A;  . FLEXIBLE SIGMOIDOSCOPY N/A 07/15/2017   Procedure: FLEXIBLE SIGMOIDOSCOPY;  Surgeon: Carol Ada, MD;  Location: [facility];  Service: Endoscopy;  Laterality: N/A;  . HEMORRHOID BANDING    . I & D EXTREMITY Left 06/01/2017   Procedure: IRRIGATION AND DEBRIDEMENT LEFT ARM HEMATOMA WITH LIGATION OF LEFT ARM AV FISTULA;  Surgeon: Elam Dutch, MD;  Location: [facility];  Service: Vascular;  Laterality: Left;  . I & D EXTREMITY Left 06/14/2017   Procedure: IRRIGATION AND DEBRIDEMENT EXTREMITY;  Surgeon: Katha Cabal, MD;  Location: [facility];  Service: Vascular;  Laterality: Left;  . INSERTION OF DIALYSIS CATHETER  05/30/2017  .  INSERTION OF DIALYSIS CATHETER N/A 05/30/2017   Procedure: INSERTION OF DIALYSIS CATHETER;  Surgeon: Elam Dutch, MD;  Location: [facility];  Service: Vascular;  Laterality: N/A;  . IR PARACENTESIS  08/30/2017  . IR PARACENTESIS  09/29/2017  . IR PARACENTESIS  10/28/2017  . IR PARACENTESIS  11/09/2017  . IR PARACENTESIS  11/16/2017  . IR PARACENTESIS  11/28/2017  . IR PARACENTESIS  12/01/2017  . IR PARACENTESIS  12/06/2017  . IR PARACENTESIS  01/03/2018  . IR PARACENTESIS  01/23/2018  . IR PARACENTESIS  02/07/2018  . IR PARACENTESIS  02/21/2018  . IR PARACENTESIS  03/06/2018  . IR PARACENTESIS  03/17/2018  . IR PARACENTESIS  04/04/2018  . IR PARACENTESIS  12/28/2018  . IR PARACENTESIS  01/08/2019  . IR PARACENTESIS  01/23/2019  . IR PARACENTESIS  02/01/2019  . IR PARACENTESIS  02/19/2019  . IR PARACENTESIS  03/01/2019  . IR PARACENTESIS  03/15/2019  . IR PARACENTESIS  04/03/2019  . IR PARACENTESIS  04/12/2019  . IR PARACENTESIS  05/01/2019  . IR PARACENTESIS  05/08/2019  . IR PARACENTESIS  05/24/2019  . IR PARACENTESIS  06/12/2019  . IR PARACENTESIS  07/09/2019  . IR PARACENTESIS  07/27/2019  . IR PARACENTESIS  08/09/2019  . IR PARACENTESIS  08/21/2019  . IR PARACENTESIS  09/17/2019  . IR RADIOLOGIST EVAL & MGMT  02/14/2018  . IR RADIOLOGIST EVAL & MGMT  02/22/2019  . LEFT HEART CATH AND CORONARY ANGIOGRAPHY N/A 02/22/2017   Procedure: LEFT HEART CATH AND CORONARY ANGIOGRAPHY;  Surgeon: Nigel Mormon, MD;  Location: [facility];  Service: Cardiovascular;  Laterality: N/A;  . LIGATION OF ARTERIOVENOUS  FISTULA Left 09/15/6438   Procedure: Plication of Left Arm Arteriovenous Fistula;  Surgeon: Elam Dutch, MD;  Location: [facility];  Service: Vascular;  Laterality: Left;  . POLYPECTOMY    . POLYPECTOMY  01/25/2018   Procedure: POLYPECTOMY;  Surgeon: Jerene Bears, MD;  Location: [facility];  Service: Gastroenterology;;  . REVISON OF ARTERIOVENOUS FISTULA Left 3/47/4259   Procedure: PLICATION OF  DISTAL ANEURYSMAL SEGEMENT OF LEFT UPPER ARM ARTERIOVENOUS FISTULA;  Surgeon: Elam Dutch, MD;  Location: [facility];  Service: Vascular;  Laterality: Left;  . REVISON OF ARTERIOVENOUS FISTULA Left 5/63/8756   Procedure: Plication of Left Upper Arm Fistula ;  Surgeon: Waynetta Sandy, MD;  Location: [facility];  Service: Vascular;  Laterality: Left;  . SKIN GRAFT SPLIT THICKNESS LEG / FOOT Left    SKIN GRAFT SPLIT THICKNESS LEFT ARM DONOR SITE: LEFT ANTERIOR THIGH  . SKIN SPLIT  GRAFT Left 07/04/2017   Procedure: SKIN GRAFT SPLIT THICKNESS LEFT ARM DONOR SITE: LEFT ANTERIOR THIGH;  Surgeon: Elam Dutch, MD;  Location: [facility];  Service: Vascular;  Laterality: Left;  . THROMBECTOMY W/ EMBOLECTOMY Left 06/05/2017   Procedure: EXPLORATION OF LEFT ARM FOR BLEEDING; OVERSEWED PROXIMAL FISTULA;  Surgeon: Angelia Mould, MD;  Location: [facility];  Service: Vascular;  Laterality: Left;  . WOUND EXPLORATION Left 06/03/2017   Procedure: WOUND EXPLORATION WITH WOUND VAC APPLICATION TO LEFT ARM;  Surgeon: Angelia Mould, MD;  Location: [facility];  Service: Vascular;  Laterality: Left;       Family History  Problem Relation Age of Onset  . Heart disease Mother   . Lung cancer Mother   . Heart disease Father   . Malignant hyperthermia Father   . COPD Father   . Throat cancer Sister   . Esophageal cancer Sister   . Hypertension Other   . COPD Other   . Colon cancer Neg Hx   . Colon polyps Neg Hx   . Rectal cancer Neg Hx   . Stomach cancer Neg Hx     Social History   Tobacco Use  . Smoking status: Current Every Day Smoker    Packs/day: 0.50    Years: 43.00    Pack years: 21.50    Types: Cigarettes    Start date: 08/13/1973  . Smokeless tobacco: Never Used  . Tobacco comment: i dont know i just make   Substance Use Topics  . Alcohol use: Not Currently    Comment: quit drinking in 2017  . Drug use: Not Currently    Types: Marijuana, Cocaine    Comment: reports using once  every 3 months, 04-06-2019 was this     Home Medications Prior to Admission medications   Medication Sig Start Date End Date Taking? Authorizing Provider  albuterol (VENTOLIN HFA) 108 (90 Base) MCG/ACT inhaler Inhale 2 puffs into the lungs every 6 (six) hours as needed for wheezing or shortness of breath.   Yes [provider]  budesonide-formoterol (SYMBICORT) 160-4.5 MCG/ACT inhaler Inhale 2 puffs into the lungs 2 (two) times daily.   Yes [provider]  diltiazem (TIAZAC) 240 MG 24 hr capsule Take 240 mg by mouth daily. 08/16/19  Yes [provider]  hydrALAZINE (APRESOLINE) 100 MG tablet Take 100 mg by mouth 2 (two) times daily.   Yes [provider]  hydrOXYzine (ATARAX/VISTARIL) 25 MG tablet Take 25 mg by mouth 2 (two) times daily as needed for itching.  08/21/19  Yes [provider]  lactulose (CHRONULAC) 10 GM/15ML solution Take 45 mLs (30 g total) by mouth daily as needed for mild constipation. 10/01/19  Yes [name], MD  metoprolol tartrate (LOPRESSOR) 100 MG tablet Take 1 tablet (100 mg total) by mouth 2 (two) times daily. 06/04/19  Yes Al Decant, MD  montelukast (SINGULAIR) 10 MG tablet Take 10 mg by mouth every evening.     [provider]  ondansetron (ZOFRAN) 4 MG tablet Take 4 mg by mouth every 4 (four) hours as needed for nausea or vomiting. 09/18/19   [provider]  ondansetron (ZOFRAN) 8 MG tablet Take 8 mg by mouth every 4 (four) hours as needed for nausea.  06/14/19   [provider]  Oxycodone HCl 10 MG TABS Take 10 mg by mouth 3 (three) times daily as needed (pain).  08/25/19   [provider]  pantoprazole (PROTONIX) 40 MG tablet Take  40 mg by mouth daily.    [provider]  sevelamer carbonate (RENVELA) 800 MG tablet Take 1,600-3,200 mg by mouth See admin instructions. Take 4 tablets (3200 mg) by mouth 3 times daily with meals and 2 tablets (1600 mg) with snacks    [provider]  vancomycin (VANCOCIN HCL) 125 MG capsule Take 1 capsule (125 mg total) by mouth 4 (four) times daily for 7 days. 10/01/19 10/08/19  [name], MD  zolpidem (AMBIEN) 10 MG tablet Take 10 mg by mouth at bedtime as needed for sleep.    [provider]    Allergies    Tramadol, Morphine, Morphine and related, Pollen extract, Acetaminophen, Aspirin, Clonidine, and Clonidine derivatives  Review of Systems   Review of Systems  Gastrointestinal: Positive for abdominal pain.  All other systems reviewed and are negative.   Physical Exam Updated Vital Signs BP 116/67 (BP Location: Right Arm)   Pulse 85   Temp 100 F (37.8 C) (Oral)   Resp (!) 22   SpO2 (!) 82%   Physical Exam Vitals and nursing note reviewed.  Constitutional:      Appearance: He is well-developed.  HENT:     Head: Normocephalic and atraumatic.     Mouth/Throat:     Mouth: Mucous membranes are moist.     Pharynx: Oropharynx is clear.  Eyes:     Extraocular Movements: Extraocular movements intact.     Pupils: Pupils are equal, round, and reactive to light.  Cardiovascular:     Rate and Rhythm: Normal rate and regular rhythm.  Pulmonary:     Effort: Pulmonary effort is normal.     Breath sounds: Normal breath sounds.  Abdominal:     Palpations: Abdomen is soft.     Tenderness: There is generalized abdominal tenderness.     Comments: Pt does have ascites, but not massive and belly is soft  Skin:    General: Skin is warm.     Capillary Refill: Capillary refill takes less than 2 seconds.  Neurological:     General: No focal deficit present.     Mental Status: He is alert and oriented to person, place, and time.  Psychiatric:        Mood and Affect: Affect is angry.        Behavior: Behavior is uncooperative.     ED Results / Procedures / Treatments   Labs (all labs ordered are listed, but only abnormal results are displayed) Labs Reviewed  COMPREHENSIVE METABOLIC PANEL -  Abnormal; Notable for the following components:      Result Value   Potassium 6.2 (*)    Chloride 93 (*)    BUN 36 (*)    Creatinine, Ser 6.84 (*)    Total Protein 6.0 (*)    Albumin 2.7 (*)    GFR calc non Af Amer 8 (*)    GFR calc Af Amer 9 (*)    Anion gap 16 (*)    All other components within normal limits  CBC - Abnormal; Notable for the following components:   WBC 11.2 (*)    RBC 2.90 (*)    Hemoglobin 8.4 (*)    HCT 23.8 (*)    RDW 16.5 (*)    All other components within normal limits  CULTURE, BLOOD (ROUTINE X 2)  CULTURE, BLOOD (ROUTINE X 2)  LIPASE, BLOOD  URINALYSIS, ROUTINE W REFLEX MICROSCOPIC  LACTIC ACID, PLASMA  LACTIC ACID, PLASMA  D-DIMER, QUANTITATIVE (NOT  AT [facility])  PROCALCITONIN  LACTATE DEHYDROGENASE  FERRITIN  TRIGLYCERIDES  FIBRINOGEN  C-REACTIVE PROTEIN    EKG None  Radiology DG Chest Portable 1 View  Result Date: 10/04/2019 CLINICAL DATA:  Hypoxia. Additional history provided: Patient from home via EMS with lower abdominal pain, had dialysis yesterday, reportedly COVID positive 316 EXAM: PORTABLE CHEST 1 VIEW COMPARISON:  Chest radiograph 09/30/2019 FINDINGS: Unchanged cardiomegaly. Aortic atherosclerosis. Chronic elevation of the left hemidiaphragm. Similar appearance of patchy interstitial opacities most notable within the bilateral mid lung fields and left lung base. No sizable pleural effusion or evidence of pneumothorax. No acute bony abnormality. IMPRESSION: Similar appearance of patchy interstitial opacities most notable within the bilateral mid lung fields and left lung base. Findings may reflect asymmetric edema or sequela of atypical/viral infection. Unchanged cardiomegaly.  Aortic atherosclerosis. Electronically Signed   By: Kellie Simmering DO   On: 10/04/2019 11:33    Procedures Procedures (including critical care time)  Medications Ordered in ED Medications  sodium chloride flush (NS) 0.9 % injection 3 mL (has no administration in  time range)  dexamethasone (DECADRON) injection 10 mg (has no administration in time range)    ED Course  I have reviewed the triage vital signs and the nursing notes.  Pertinent labs & imaging results that were available during my care of the patient were reviewed by me and considered in my medical decision making (see chart for details).    MDM Rules/Calculators/A&P                      Pt is febrile, but reports allergies to tylenol, ibuprofen, and asa.  He said he can't take anything for fever.  As he is still febrile and hypoxic, I will keep him on Covid precautions.  Pt's O2 sats are 82%, so he's put on 2L and oxygenation is improved.  Pt's K is elevated and CXR shows edema vs viral infection.    Pt d/w nephrology who will put him on the list for dialysis.  Pt d/w Dr. Roosevelt Locks (triad) for admission.  CRITICAL CARE Performed by: Isla Pence   Total critical care time: 30 minutes  Critical care time was exclusive of separately billable procedures and treating other patients.  Critical care was necessary to treat or prevent imminent or life-threatening deterioration.  Critical care was time spent personally by me on the following activities: development of treatment plan with patient and/or surrogate as well as nursing, discussions with consultants, evaluation of patient's response to treatment, examination of patient, obtaining history from patient or surrogate, ordering and performing treatments and interventions, ordering and review of laboratory studies, ordering and review of radiographic studies, pulse oximetry and re-evaluation of patient's condition.  [name] was evaluated in Emergency Department on 10/04/2019 for the symptoms described in the history of present illness. He was evaluated in the context of the global COVID-19 pandemic, which necessitated consideration that the patient might be at risk for infection with the SARS-CoV-2 virus that causes  COVID-19. Institutional protocols and algorithms that pertain to the evaluation of patients at risk for COVID-19 are in a state of rapid change based on information released by regulatory bodies including the CDC and federal and state organizations. These policies and algorithms were followed during the patient's care in the ED.  Final Clinical Impression(s) / ED Diagnoses Final diagnoses:  ESRD on hemodialysis ([place])  Hyperkalemia  Acute respiratory failure with hypoxia ([place])  COVID-19 virus infection    Rx /  DC Orders ED Discharge Orders    None       Isla Pence, MD 10/04/19 1334

## 2019-10-04 NOTE — ED Triage Notes (Addendum)
Pt arrives via [facility] from home where he came c/o lower abd pain. Had dialysis yesterday, EMS VSS. O2 on room air 88%, improved to 98% on 2L. CBG 88. Pt a/ox. Per chart, covid positive on 3/16.

## 2019-10-05 ENCOUNTER — Inpatient Hospital Stay (HOSPITAL_COMMUNITY): Payer: Medicare Other

## 2019-10-05 DIAGNOSIS — U071 COVID-19: Secondary | ICD-10-CM

## 2019-10-05 DIAGNOSIS — N186 End stage renal disease: Secondary | ICD-10-CM

## 2019-10-05 DIAGNOSIS — Z992 Dependence on renal dialysis: Secondary | ICD-10-CM

## 2019-10-05 DIAGNOSIS — R188 Other ascites: Secondary | ICD-10-CM

## 2019-10-05 DIAGNOSIS — R7989 Other specified abnormal findings of blood chemistry: Secondary | ICD-10-CM

## 2019-10-05 HISTORY — PX: IR PARACENTESIS: IMG2679

## 2019-10-05 LAB — CBC
HCT: 24.7 % — ABNORMAL LOW (ref 39.0–52.0)
Hemoglobin: 8.7 g/dL — ABNORMAL LOW (ref 13.0–17.0)
MCH: 28.8 pg (ref 26.0–34.0)
MCHC: 35.2 g/dL (ref 30.0–36.0)
MCV: 81.8 fL (ref 80.0–100.0)
Platelets: 177 10*3/uL (ref 150–400)
RBC: 3.02 MIL/uL — ABNORMAL LOW (ref 4.22–5.81)
RDW: 17.4 % — ABNORMAL HIGH (ref 11.5–15.5)
WBC: 7.8 10*3/uL (ref 4.0–10.5)
nRBC: 0 % (ref 0.0–0.2)

## 2019-10-05 LAB — COMPREHENSIVE METABOLIC PANEL
ALT: 20 U/L (ref 0–44)
AST: 34 U/L (ref 15–41)
Albumin: 2.7 g/dL — ABNORMAL LOW (ref 3.5–5.0)
Alkaline Phosphatase: 116 U/L (ref 38–126)
Anion gap: 14 (ref 5–15)
BUN: 21 mg/dL — ABNORMAL HIGH (ref 6–20)
CO2: 26 mmol/L (ref 22–32)
Calcium: 8.8 mg/dL — ABNORMAL LOW (ref 8.9–10.3)
Chloride: 96 mmol/L — ABNORMAL LOW (ref 98–111)
Creatinine, Ser: 4.28 mg/dL — ABNORMAL HIGH (ref 0.61–1.24)
GFR calc Af Amer: 17 mL/min — ABNORMAL LOW (ref >60)
GFR calc non Af Amer: 14 mL/min — ABNORMAL LOW (ref >60)
Glucose, Bld: 99 mg/dL (ref 70–99)
Potassium: 5.4 mmol/L — ABNORMAL HIGH (ref 3.5–5.1)
Sodium: 136 mmol/L (ref 135–145)
Total Bilirubin: 1 mg/dL (ref 0.3–1.2)
Total Protein: 5.8 g/dL — ABNORMAL LOW (ref 6.5–8.1)

## 2019-10-05 LAB — D-DIMER, QUANTITATIVE: D-Dimer, Quant: >20 ug/mL-FEU — ABNORMAL HIGH (ref 0.00–0.50)

## 2019-10-05 LAB — BODY FLUID CELL COUNT WITH DIFFERENTIAL
Eos, Fluid: 0 %
Lymphs, Fluid: 12 %
Monocyte-Macrophage-Serous Fluid: 64 % (ref 50–90)
Neutrophil Count, Fluid: 24 % (ref 0–25)
Total Nucleated Cell Count, Fluid: 94 cu mm (ref 0–1000)

## 2019-10-05 LAB — GRAM STAIN

## 2019-10-05 LAB — FERRITIN: Ferritin: 1851 ng/mL — ABNORMAL HIGH (ref 24–336)

## 2019-10-05 LAB — C-REACTIVE PROTEIN: CRP: 13.7 mg/dL — ABNORMAL HIGH (ref <1.0)

## 2019-10-05 MED ORDER — ALBUTEROL SULFATE HFA 108 (90 BASE) MCG/ACT IN AERS
2.0000 | INHALATION_SPRAY | RESPIRATORY_TRACT | Status: DC | PRN
  Filled 2019-10-05: qty 6.7

## 2019-10-05 MED ORDER — LIDOCAINE HCL 1 % IJ SOLN
INTRAMUSCULAR | Status: DC | PRN
  Administered 2019-10-05: 10 mL

## 2019-10-05 MED ORDER — SODIUM ZIRCONIUM CYCLOSILICATE 10 G PO PACK
10.0000 g | PACK | Freq: Every day | ORAL | Status: AC
  Administered 2019-10-05: 10 g via ORAL
  Filled 2019-10-05: qty 1

## 2019-10-05 MED ORDER — LIDOCAINE HCL 1 % IJ SOLN
INTRAMUSCULAR | Status: AC
  Filled 2019-10-05: qty 20

## 2019-10-05 MED ORDER — VANCOMYCIN 50 MG/ML ORAL SOLUTION
125.0000 mg | Freq: Four times a day (QID) | ORAL | Status: DC
  Administered 2019-10-05 – 2019-10-06 (×3): 125 mg via ORAL
  Filled 2019-10-05 (×9): qty 2.5

## 2019-10-05 NOTE — Progress Notes (Addendum)
PROGRESS NOTE                                                                                                                                                                                                             Patient Demographics:    [name], is a 59 y.o. male, DOB - 1960-08-29, GGY:[id number]  Outpatient Primary MD for the patient is [name], FNP   Admit date - 10/04/2019   LOS - 1  Chief Complaint  Patient presents with  . Abdominal Pain       Brief Narrative: Patient is a 58 y.o. male with PMHx of ESRD on HD, liver cirrhosis with recurrent ascites requiring serial paracentesis, hepatitis C, PAF not on anticoagulation, HTN, chronic pain syndrome, CAD s/p PCI to RCA in 2018,  history of cocaine/marijuana abuse-who was admitted to this facility from 3/16-3/22 for acute hypoxic respiratory failure from volume overload due to missed dialysis and COVID-19 pneumonia-he was also found to have C. difficile colitis-he unfortunately signed out AMA on 3/22-presented to the ED on 3/25 with acute hypoxic respiratory failure and abdominal pain.  See below for further details.  Significant Events: 3/16-3/22: Admit to [facility] for hypoxia from fluid overload due to missed dialysis/COVID-19 and C. difficile colitis.  Patient signed out AMA. 3/25: Presented to the ED with hypoxia and abdominal pain. 3/25>> TTE-EF 50-55%, severely reduced RV systolic function, mild/moderate MS, mild/moderate AAS  COVID-19 medications: Steroids: 3/16>> 3/21 Remdesivir: 3/16>> 3/21  Antibiotics: Oral vancomycin: 3/26>>  Microbiology data: 3/25: Blood culture>> negative so far  DVT prophylaxis: SQ heparin  Procedures: None  Consults: None    Subjective:    [name] today feels better-he does not have any shortness of breath at rest.  He was on room air this morning.  He denies any diarrhea.  Claims abdominal pain that  he had yesterday has resolved.  Claims that he has an important mail from his transplant center that he needs to go and get-and is again contemplating about leaving [place].   Assessment  & Plan :   Acute Hypoxic Resp Failure: Felt to be secondary to fluid overload in the setting of ESRD-not completing full treatment in the outpatient setting.  Underwent HD on 3/25-improved-and on room air this morning.  I doubt patient has active Covid pneumonia at  this time.  Covid 19 pneumonia: Treated appropriately during last admission-CRP significantly elevated-could be from fluid overload/abdominal pain.  He is on room air this morning-and has improved post hemodialysis-suspect does not need any further treatment-continue to observe for now.  Fever: afebrile  O2 requirements:  SpO2: 90 % O2 Flow Rate (L/min): 2 L/min   COVID-19 Labs: Recent Labs    10/04/19 1400  DDIMER >20.00*  FERRITIN 3,045*  LDH 317*  CRP 16.0*       Component Value Date/Time   BNP 3,065.2 (H) 09/30/2019 0744    Recent Labs  Lab 10/04/19 1400  PROCALCITON 2.05    Lab Results  Component Value Date   SARSCOV2NAA POSITIVE (A) 09/25/2019   [place] NEGATIVE 08/02/2019   [place] NEGATIVE 07/29/2019   [place] NEGATIVE 07/17/2019     Prone/Incentive Spirometry: encouraged  incentive spirometry use 3-4/hour.  Elevated D-dimer: Check lower extremity Dopplers-he is on room air this morning-therefore doubt PE.  Has improved after HD-high suspicion that pulmonary edema was the cause of hypoxia on admission.  Abdominal pain: Abdominal exam benign-awaiting paracentesis to rule out SBP.  Agree with admitting MD-to hold antibiotics-due to low suspicion for SBP-and recent history of C. difficile colitis.  Recent history of C. difficile colitis: Denies diarrhea-reviewed last discharge summary-was supposed to be on vancomycin x1 additional week.  Poor historian-I doubt compliance-resume vancomycin  while he is in the hospital.  Have stopped PPI today.  ESRD on HD TTS: Nephrology following-defer to nephrology.  Hyperkalemia: Continues to have mild hyperkalemia-start Lokelma-defer HD to renal.  Anemia: Secondary to ESRD-defer Aranesp/iron to nephrology.  CAD s/p PCI to RCA in 2018: No anginal symptoms-supportive care  PAF: Rate controlled with Cardizem and metoprolol-suspect due to noncompliance-not a good long-term anticoagulation candidate.  COPD/cor pulmonale: Stable-continue bronchodilators.  TTE with significantly dilated RV and poor RV systolic function which I suspect chronic given his overall clinical stability today.  HTN: Controlled-continue Cardizem, metoprolol  QTC prolongation: Noted at admission-however patient has a wide QRS complex at baseline.  Monitor in telemetry.  Liver cirrhosis/hep C: Apart from ascites-appears well compensated.  RN pressure injury documentation: Pressure Injury 10/04/19 Coccyx Stage 2 -  Partial thickness loss of dermis presenting as a shallow open injury with a red, pink wound bed without slough. (Active)  10/04/19 2100  Location: Coccyx  Location Orientation:   Staging: Stage 2 -  Partial thickness loss of dermis presenting as a shallow open injury with a red, pink wound bed without slough.  Wound Description (Comments):   Present on Admission: Yes   ABG:    Component Value Date/Time   PHART 7.490 (H) 09/26/2019 0857   PCO2ART 42.1 09/26/2019 0857   PO2ART 136.0 (H) 09/26/2019 0857   HCO3 32.2 (H) 09/26/2019 0857   TCO2 33 (H) 09/26/2019 0857   ACIDBASEDEF 2.0 09/26/2019 0708   O2SAT 99.0 09/26/2019 0857    Vent Settings: N/A   Condition -  Guarded  Family Communication  : Left a voicemail for sister  Code Status : DNR  Diet :  Diet Order            Diet NPO time specified  Diet effective midnight               Disposition Plan  :  SNF vs Home health when ready for discharge  Barriers to discharge: Improving  hypoxia-persistent hyperkalemia-needs inpatient hemodialysis tomorrow.  Awaiting paracentesis to rule out SBP.  Antimicorbials  :    Anti-infectives (From  admission, onward)   None      Inpatient Medications  Scheduled Meds: . [START ON 10/06/2019] calcitRIOL  1 mcg Oral Q T,Th,Sa-HD  . Chlorhexidine Gluconate Cloth  6 each Topical Q0600  . dexamethasone (DECADRON) injection  10 mg Intravenous Once  . diltiazem  240 mg Oral Daily  . fluticasone furoate-vilanterol  1 puff Inhalation Daily  . guaiFENesin  600 mg Oral BID  . heparin  5,000 Units Subcutaneous Q8H  . hydrALAZINE  100 mg Oral BID  . mouth rinse  15 mL Mouth Rinse BID  . metoprolol tartrate  100 mg Oral BID  . montelukast  10 mg Oral QPM  . pantoprazole  40 mg Oral Daily  . sevelamer carbonate  1,600 mg Oral With snacks  . sevelamer carbonate  3,200 mg Oral TID with meals  . sodium chloride flush  3 mL Intravenous Once   Continuous Infusions: . sodium chloride    . sodium chloride    . albumin human 12.5 g (10/05/19 0415)   PRN Meds:.sodium chloride, sodium chloride, albuterol, HYDROmorphone (DILAUDID) injection, hydrOXYzine, lactulose, lidocaine (PF), lidocaine-prilocaine, metoCLOPramide (REGLAN) injection, oxyCODONE, pentafluoroprop-tetrafluoroeth, zolpidem   Time Spent in minutes  35     See all Orders from today for further details   Oren Binet M.D on 10/05/2019 at 7:18 AM  To page go to [email or web address] - use universal password  [facility] -  Office  [phone]    Objective:   Vitals:   10/04/19 2313 10/05/19 0322 10/05/19 0326 10/05/19 0400  BP: 118/64  (!) 111/59 111/70  Pulse: 72 75 75 62  Resp: 16 (!) 21 20 20   Temp: 98.4 F (36.9 C)  98.5 F (36.9 C)   TempSrc: Oral  Oral   SpO2: 90% (!) 89% 90% 90%  Weight:  67.4 kg    Height:        Wt Readings from Last 3 Encounters:  10/05/19 67.4 kg  10/01/19 66.3 kg  09/23/19 63.5 kg     Intake/Output Summary (Last 24 hours)  at 10/05/2019 0718 Last data filed at 10/05/2019 0449 Gross per 24 hour  Intake 340 ml  Output 1500 ml  Net -1160 ml     Physical Exam Gen Exam:Alert awake-not in any distress HEENT:atraumatic, normocephalic Chest: B/L clear to auscultation anteriorly CVS:S1S2 regular Abdomen:soft non tender, mildly distended but not tense. Extremities:no edema Neurology: Non focal Skin: no rash   Data Review:    CBC Recent Labs  Lab 09/30/19 0356 09/30/19 0744 10/01/19 0326 10/01/19 0926 10/04/19 1022  WBC 9.7 9.3 10.0 9.3 11.2*  HGB 9.0* 9.1* 9.2* 8.9* 8.4*  HCT 25.7* 25.8* 25.7* 24.2* 23.8*  PLT 105* 108* 143* 129* 152  MCV 74.9* 75.9* 77.9* 79.6* 82.1  MCH 26.2 26.8 27.9 29.3 29.0  MCHC 35.0 35.3 35.8 36.8* 35.3  RDW 14.4 14.6 14.6 14.6 16.5*  LYMPHSABS  --   --  1.1 1.0  --   MONOABS  --   --  0.7 0.7  --   EOSABS  --   --  0.0 0.0  --   BASOSABS  --   --  0.0 0.0  --     Chemistries  Recent Labs  Lab 09/29/19 0433 09/30/19 0356 09/30/19 0744 10/01/19 0326 10/01/19 0926 10/04/19 1022 10/05/19 0448  NA 132*   < > 132* 133* 131* 135 136  K 4.1   < > 4.1 4.5 4.5 6.2* 5.4*  CL 94*   < > 93*  93* 94* 93* 96*  CO2 26   < > 23 22 23 26 26   GLUCOSE 98   < > 84 70 77 74 99  BUN 26*   < > 39* 49* 53* 36* 21*  CREATININE 4.51*   < > 5.81* 6.73* 7.00* 6.84* 4.28*  CALCIUM 8.6*   < > 9.4 9.1 9.0 9.4 8.8*  MG 1.5*  --  1.6* 1.9 2.0  --   --   AST  --   --   --  53* 49* 34 34  ALT  --   --   --  24 24 20 20   ALKPHOS  --   --   --  110 100 123 116  BILITOT  --   --   --  0.6 0.6 1.2 1.0   < > = values in this interval not displayed.   ------------------------------------------------------------------------------------------------------------------ Recent Labs    10/04/19 1400  TRIG 183*    Lab Results  Component Value Date   HGBA1C 4.7 (L) 09/26/2019    ------------------------------------------------------------------------------------------------------------------ No results for input(s): TSH, T4TOTAL, T3FREE, THYROIDAB in the last 72 hours.  Invalid input(s): FREET3 ------------------------------------------------------------------------------------------------------------------ Recent Labs    10/04/19 1400  FERRITIN 3,045*    Coagulation profile Recent Labs  Lab 10/04/19 1400  INR 1.4*    Recent Labs    10/04/19 1400  DDIMER >20.00*    Cardiac Enzymes No results for input(s): CKMB, TROPONINI, MYOGLOBIN in the last 168 hours.  Invalid input(s): CK ------------------------------------------------------------------------------------------------------------------    Component Value Date/Time   BNP 3,065.2 (H) 09/30/2019 1287    Micro Results Recent Results (from the past 240 hour(s))  Respiratory Panel by RT PCR (Flu A&B, Covid) - Nasopharyngeal Swab     Status: Abnormal   Collection Time: 09/25/19 11:45 PM   Specimen: Nasopharyngeal Swab  Result Value Ref Range Status   SARS Coronavirus 2 by RT PCR POSITIVE (A) NEGATIVE Final    Comment: RESULT CALLED TO, READ BACK BY AND VERIFIED WITH: E ROBERTSON RN 09/26/19 0100 JDW (NOTE) SARS-CoV-2 target nucleic acids are DETECTED. SARS-CoV-2 RNA is generally detectable in upper respiratory specimens  during the acute phase of infection. Positive results are indicative of the presence of the identified virus, but do not rule out bacterial infection or co-infection with other pathogens not detected by the test. Clinical correlation with patient history and other diagnostic information is necessary to determine patient infection status. The expected result is Negative. Fact Sheet for Patients:  PinkCheek.be Fact Sheet for Healthcare Providers: GravelBags.it This test is not yet approved or cleared by the [place] FDA and  has been authorized for detection and/or diagnosis of SARS-CoV-2 by FDA under an Emergency Use Authorization (EUA).  This EUA will remain in effect (meaning this test can be used) for t he duration of  the COVID-19 declaration under Section 564(b)(1) of the Act, 21 U.S.C. section 360bbb-3(b)(1), unless the authorization is terminated or revoked sooner.    Influenza A by PCR NEGATIVE NEGATIVE Final   Influenza B by PCR NEGATIVE NEGATIVE Final    Comment: (NOTE) The Xpert Xpress SARS-CoV-2/FLU/RSV assay is intended as an aid in  the diagnosis of influenza from Nasopharyngeal swab specimens and  should not be used as a sole basis for treatment. Nasal washings and  aspirates are unacceptable for Xpert Xpress SARS-CoV-2/FLU/RSV  testing. Fact Sheet for Patients: PinkCheek.be Fact Sheet for Healthcare Providers: GravelBags.it This test is not yet approved or cleared by the [place]  FDA and  has been authorized for detection and/or diagnosis of SARS-CoV-2 by  FDA under an Emergency Use Authorization (EUA). This EUA will remain  in effect (meaning this test can be used) for the duration of the  Covid-19 declaration under Section 564(b)(1) of the Act, 21  U.S.C. section 360bbb-3(b)(1), unless the authorization is  terminated or revoked. Performed at [facility], [place] [address] [place], [place] [address]   MRSA PCR Screening     Status: None   Collection Time: 09/26/19  2:34 AM   Specimen: Nasopharyngeal  Result Value Ref Range Status   MRSA by PCR NEGATIVE NEGATIVE Final    Comment:        The GeneXpert MRSA Assay (FDA approved for NASAL specimens only), is one component of a comprehensive MRSA colonization surveillance program. It is not intended to diagnose MRSA infection nor to guide or monitor treatment for MRSA infections. Performed at [facility], [place] [address], [place], [place]  [address]   C difficile quick scan w PCR reflex     Status: Abnormal   Collection Time: 09/26/19  9:30 AM   Specimen: STOOL  Result Value Ref Range Status   C Diff antigen POSITIVE (A) NEGATIVE Final   C Diff toxin NEGATIVE NEGATIVE Final   C Diff interpretation Results are indeterminate. See PCR results.  Final    Comment: Performed at [facility], [place] [address], [place], [place] [address]  C. Diff by PCR, Reflexed     Status: Abnormal   Collection Time: 09/26/19  9:30 AM  Result Value Ref Range Status   Toxigenic C. Difficile by PCR POSITIVE (A) NEGATIVE Final    Comment: Positive for toxigenic C. difficile with little to no toxin production. Only treat if clinical presentation suggests symptomatic illness. Performed at [facility], [place] [address], [place],  [address]     Radiology Reports DG Chest Portable 1 View  Result Date: 10/04/2019 CLINICAL DATA:  Hypoxia. Additional history provided: Patient from home via EMS with lower abdominal pain, had dialysis yesterday, reportedly COVID positive 316 EXAM: PORTABLE CHEST 1 VIEW COMPARISON:  Chest radiograph 09/30/2019 FINDINGS: Unchanged cardiomegaly. Aortic atherosclerosis. Chronic elevation of the left hemidiaphragm. Similar appearance of patchy interstitial opacities most notable within the bilateral mid lung fields and left lung base. No sizable pleural effusion or evidence of pneumothorax. No acute bony abnormality. IMPRESSION: Similar appearance of patchy interstitial opacities most notable within the bilateral mid lung fields and left lung base. Findings may reflect asymmetric edema or sequela of atypical/viral infection. Unchanged cardiomegaly.  Aortic atherosclerosis. Electronically Signed   By: Kellie Simmering DO   On: 10/04/2019 11:33   DG Chest Port 1 View  Result Date: 09/30/2019 CLINICAL DATA:  Shortness of breath.  Abdominal distention. EXAM: PORTABLE CHEST 1 VIEW COMPARISON:  September 27, 2019 FINDINGS: Patchy  interstitial and ground-glass opacities are seen in the lungs, right greater than left. Cardiomegaly. The hila and mediastinum are normal. No pneumothorax. No other acute abnormalities. IMPRESSION: Patchy interstitial and ground-glass opacities may represent asymmetric edema versus atypical infection. No other acute abnormalities. Electronically Signed   By: Dorise Bullion III M.D   On: 09/30/2019 08:43   DG Chest Port 1 View  Result Date: 09/27/2019 CLINICAL DATA:  Acute respiratory failure. EXAM: PORTABLE CHEST 1 VIEW COMPARISON:  09/26/2019. FINDINGS: Endotracheal tube, NG tube in stable position. Cardiomegaly. Persistent bilateral interstitial prominence. Findings suggest CHF with interstitial edema. Pneumonitis cannot  be excluded. Low lung volumes with bibasilar atelectasis. Small left pleural effusion cannot be excluded. No pneumothorax. Aortic, bilateral subclavian, bilateral carotid atherosclerotic vascular calcification. IMPRESSION: 1.  Endotracheal tube and NG tube in stable position. 2. Cardiomegaly persistent diffuse pulmonary interstitial prominence without significant change from prior exam. Findings suggest CHF. Small left pleural effusion cannot be excluded. 3.  Low lung volumes with bibasilar atelectasis. 4. Aortic, bilateral subclavian, bilateral carotid atherosclerotic vascular disease. Electronically Signed   By: Marcello Moores  Register   On: 09/27/2019 08:18   DG Chest Port 1 View  Result Date: 09/26/2019 CLINICAL DATA:  COVID-19 virus infection. Respiratory failure. Endotracheal intubation. EXAM: PORTABLE CHEST 1 VIEW COMPARISON:  09/25/2019 FINDINGS: Endotracheal tube and orogastric tube are in appropriate position. No pneumothorax visualized. Cardiomegaly stable. Aortic atherosclerosis incidentally noted. Elevation of left hemidiaphragm is again noted, with left basilar atelectasis. Heterogeneous bilateral airspace disease shows mild worsening in central right upper lobe. No other new or  worsening areas of airspace disease identified. IMPRESSION: 1. Bilateral airspace disease, with mild worsening in central right upper lobe. 2. Persistent elevation of left hemidiaphragm and left basilar atelectasis. 3. Endotracheal tube and orogastric tube in appropriate position. Electronically Signed   By: Marlaine Hind M.D.   On: 09/26/2019 09:55   DG Chest Port 1 View  Result Date: 09/25/2019 CLINICAL DATA:  Endotracheal tube and enteric tube placement. EXAM: PORTABLE CHEST 1 VIEW COMPARISON:  Earlier on the same date FINDINGS: Interval placement of an endotracheal 2, its tip projecting 6.5 cm above the carina. Interval placement of an enteric tube which traverses the left upper abdominal quadrant in the expected location of the gastric lumen, its tip not imaged. Stable moderate cardiomegaly with vascular congestion, moderate interstitial edema, and diffuse interstitial and patchy airspace opacities in both lungs. Thoracic aorta and bilateral subclavian and axillary coarse atherosclerotic calcifications. Telemetry leads. Probable trace left pleural effusion. No pneumothorax. IMPRESSION: Interval placement of an endotracheal tube and enteric tube; a 3 cm advancement of the endotracheal tube could be considered. Stable moderate cardiomegaly with pulmonary edema, trace left pleural effusion and diffuse pulmonary disease which could be secondary to edema or infectious. Aorta and bilateral subclavian and axillary coarse atherosclerotic calcifications. Electronically Signed   By: Revonda Humphrey   On: 09/25/2019 23:34   DG Chest Port 1 View  Result Date: 09/25/2019 CLINICAL DATA:  59 year old male with cough and weakness. EXAM: PORTABLE CHEST 1 VIEW COMPARISON:  Chest radiograph dated 09/23/2019. FINDINGS: There is diffuse interstitial coarsening and mild chronic bronchitic changes. Faint bilateral mid lung field densities concerning for developing infiltrate. Clinical correlation is recommended. No large pleural  effusion or pneumothorax. There is stable cardiomegaly. Atherosclerotic calcification of the aorta. No acute osseous pathology. IMPRESSION: Bilateral mid lung field densities concerning for developing infiltrate. Clinical correlation and follow-up recommended. Electronically Signed   By: Anner Crete M.D.   On: 09/25/2019 21:22   DG Chest Port 1 View  Result Date: 09/23/2019 CLINICAL DATA:  Shortness of breath and abdominal distention. EXAM: PORTABLE CHEST 1 VIEW COMPARISON:  August 10, 2019 FINDINGS: Mild chronic appearing increased interstitial lung markings are again seen. Very mild hazy infiltrates are also seen along the lateral aspect of the mid lung fields, right greater than left. This represents a new finding when compared to the prior study. There is no evidence of a pleural effusion or pneumothorax. There is marked severity cardiomegaly which is unchanged in size. Marked severity calcification of the aortic arch is seen. The visualized  skeletal structures are unremarkable. IMPRESSION: 1. Chronic appearing increased interstitial lung markings with mild hazy infiltrates seen along the lateral aspects of the mid lung fields, right greater than left. Electronically Signed   By: Virgina Norfolk M.D.   On: 09/23/2019 22:04   DG Abd Portable 1V  Result Date: 09/30/2019 CLINICAL DATA:  Abdominal pain. EXAM: PORTABLE ABDOMEN - 1 VIEW COMPARISON:  None. FINDINGS: Decubitus films are identified. No free air identified. Air-filled loops of large and small bowel are normal in caliber with no evidence of obstruction. IMPRESSION: No evidence of free air.  No evidence of obstruction identified. Electronically Signed   By: Dorise Bullion III M.D   On: 09/30/2019 08:45   ECHOCARDIOGRAM COMPLETE  Result Date: 10/04/2019    ECHOCARDIOGRAM REPORT   Patient Name:   GAGE WEANT Date of Exam: 10/04/2019 Medical Rec #:  [id number]              Height:       69.0 in Accession #:    [id number]              Weight:       146.2 lb Date of Birth:  02-Feb-1961               BSA:          1.808 m Patient Age:    75 years               BP:           125/70 mmHg Patient Gender: M                      HR:           71 bpm. Exam Location:  Inpatient Procedure: 2D Echo Indications:    Dyspnea 786.09 / R06.00  History:        Patient has prior history of Echocardiogram examinations, most                 recent 08/30/2017. COPD, Arrythmias:Atrial Flutter and LBBB,                 Signs/Symptoms:Shortness of Breath; Risk Factors:Current Smoker                 and Hypertension. Alcohol abuse                 Fluid overload                 ESRD.  Sonographer:    Vikki Ports Turrentine Referring Phys: [id number] [place]  1. Low normal LV systolic function; severe LVH (myocardium with specked appearance; consider amyloid); mild to moderate AS (mean gradient 11 mmHg and AVA 1.3 cm2); mild AI; thickened MV with mild to moderate MS (mean gradient 6 mmHg and MVA 1.2-2 cm2; pt in atrial fibrillation at time of study); severe biatrial enlargement; mild RVE; severe RV dysfunction; severe TR with severe pulmonary hypertension.  2. Left ventricular ejection fraction, by estimation, is 50 to 55%. The left ventricle has low normal function. The left ventricle has no regional wall motion abnormalities. There is severe left ventricular hypertrophy. Left ventricular diastolic function could not be evaluated.  3. Right ventricular systolic function is severely reduced. The right ventricular size is mildly enlarged. There is severely elevated pulmonary artery systolic pressure.  4. Left atrial size was severely dilated.  5. Right atrial size was severely dilated.  6. Large  pleural effusion in the left lateral region.  7. The mitral valve is normal in structure. Mild mitral valve regurgitation. Mild to moderate mitral stenosis.  8. Tricuspid valve regurgitation is severe.  9. The aortic valve is normal in structure. Aortic valve regurgitation  is mild. Mild to moderate aortic valve stenosis. 10. The inferior vena cava is dilated in size with <50% respiratory variability, suggesting right atrial pressure of 15 mmHg. FINDINGS  Left Ventricle: Left ventricular ejection fraction, by estimation, is 50 to 55%. The left ventricle has low normal function. The left ventricle has no regional wall motion abnormalities. The left ventricular internal cavity size was normal in size. There is severe left ventricular hypertrophy. Left ventricular diastolic function could not be evaluated due to atrial fibrillation. Left ventricular diastolic function could not be evaluated. Right Ventricle: The right ventricular size is mildly enlarged. Right ventricular systolic function is severely reduced. There is severely elevated pulmonary artery systolic pressure. The tricuspid regurgitant velocity is 4.64 m/s, and with an assumed right atrial pressure of 15 mmHg, the estimated right ventricular systolic pressure is 093.8 mmHg. Left Atrium: Left atrial size was severely dilated. Right Atrium: Right atrial size was severely dilated. Pericardium: A small pericardial effusion is present. Mitral Valve: There is moderate thickening of the mitral valve leaflet(s). Severe mitral annular calcification. Mild mitral valve regurgitation. Mild to moderate mitral valve stenosis. Tricuspid Valve: The tricuspid valve is normal in structure. Tricuspid valve regurgitation is severe. No evidence of tricuspid stenosis. Aortic Valve: The aortic valve is normal in structure. Aortic valve regurgitation is mild. Mild to moderate aortic stenosis is present. Aortic valve mean gradient measures 10.0 mmHg. Aortic valve peak gradient measures 16.6 mmHg. Aortic valve area, by VTI measures 1.28 cm. Pulmonic Valve: The pulmonic valve was normal in structure. Pulmonic valve regurgitation is trivial. No evidence of pulmonic stenosis. Aorta: The aortic root is normal in size and structure. Venous: The inferior  vena cava is dilated in size with less than 50% respiratory variability, suggesting right atrial pressure of 15 mmHg. IAS/Shunts: No atrial level shunt detected by color flow Doppler. Additional Comments: Low normal LV systolic function; severe LVH (myocardium with specked appearance; consider amyloid); mild to moderate AS (mean gradient 11 mmHg and AVA 1.3 cm2); mild AI; thickened MV with mild to moderate MS (mean gradient 6 mmHg and  MVA 1.2-2 cm2; pt in atrial fibrillation at time of study); severe biatrial enlargement; mild RVE; severe RV dysfunction; severe TR with severe pulmonary hypertension. There is a large pleural effusion in the left lateral region.  LEFT VENTRICLE PLAX 2D LVIDd:         4.10 cm LVIDs:         2.70 cm LV PW:         1.60 cm LV IVS:        1.60 cm LVOT diam:     1.90 cm LV SV:         50 LV SV Index:   27 LVOT Area:     2.84 cm  LEFT ATRIUM              Index       RIGHT ATRIUM           Index LA diam:        5.10 cm  2.82 cm/m  RA Area:     39.60 cm LA Vol (A2C):   112.0 ml 61.94 ml/m RA Volume:   166.00 ml 91.80 ml/m LA Vol (  A4C):   102.0 ml 56.41 ml/m LA Biplane Vol: 111.0 ml 61.39 ml/m  AORTIC VALVE AV Area (Vmax):    1.33 cm AV Area (Vmean):   1.20 cm AV Area (VTI):     1.28 cm AV Vmax:           204.00 cm/s AV Vmean:          146.500 cm/s AV VTI:            0.387 m AV Peak Grad:      16.6 mmHg AV Mean Grad:      10.0 mmHg LVOT Vmax:         95.80 cm/s LVOT Vmean:        61.800 cm/s LVOT VTI:          0.175 m LVOT/AV VTI ratio: 0.45  AORTA Ao Root diam: 3.10 cm TRICUSPID VALVE TR Peak grad:   86.1 mmHg TR Vmax:        464.00 cm/s  SHUNTS Systemic VTI:  0.18 m Systemic Diam: 1.90 cm Kirk Ruths MD Electronically signed by Kirk Ruths MD Signature Date/Time: 10/04/2019/4:14:14 PM    Final    IR Paracentesis  Result Date: 09/17/2019 INDICATION: Patient history of hepatitis C cirrhosis end-stage renal disease with recurrent ascites presents for therapeutic and  diagnostic paracentesis EXAM: ULTRASOUND GUIDED THERAPEUTIC AND DIAGNOSTIC PARACENTESIS MEDICATIONS: Lidocaine 1% 10 mL COMPLICATIONS: None immediate. PROCEDURE: Informed written consent was obtained from the patient after a discussion of the risks, benefits and alternatives to treatment. A timeout was performed prior to the initiation of the procedure. Initial ultrasound scanning demonstrates a moderate amount of ascites within the right lower abdominal quadrant. The right lower abdomen was prepped and draped in the usual sterile fashion. 1% lidocaine was used for local anesthesia. Following this, a 19 gauge, 7-cm, Yueh catheter was introduced. An ultrasound image was saved for documentation purposes. The paracentesis was performed. The catheter was removed and a dressing was applied. The patient tolerated the procedure well without immediate post procedural complication. FINDINGS: A total of approximately 2.8 L of straw-colored fluid was removed. Samples were sent to the laboratory as requested by the clinical team. IMPRESSION: Successful ultrasound-guided therapeutic and diagnostic paracentesis yielding 2.8 liters of peritoneal fluid. Read by Rushie Nyhan NP Electronically Signed   By: Corrie Mckusick D.O.   On: 09/17/2019 12:04

## 2019-10-05 NOTE — Evaluation (Signed)
Physical Therapy Evaluation Patient Details Name: [name] MRN: [id number] DOB: 15-Oct-1960 Today's Date: 10/05/2019   History of Present Illness  59 year old male admitted 10/04/19 after leaving AMA 3 days ago. He presented with abdominal pain, possibly from cdiff. Patient with abdominal distention. Xray showing fluid overload vs infection. Paracentesis 10/05/19 to rule out SBP. Patient also +COVID.  Clinical Impression  BLE [place] negative for DVT 10/05/19. Patient with limited tolerance for PT evaluation questions and assessment. Overall, he presents with generalized weakness, decreased activity tolerance, and impaired balance. He is close supervision for mobility and uses intermittent UE support on environmental objects such as sink counter and doorframe. Oxygen sensor not always with accurate reading but 88% sitting EOB and in 90s% when patient seated on toilet. Patient denies having any concerns for his mobility. He is requesting to discharge home. Secure message to MD sent.     Follow Up Recommendations Supervision/Assistance - 24 hour    Equipment Recommendations  (TBD, none recommended at this time)       Precautions / Restrictions Precautions Precautions: Fall Restrictions Weight Bearing Restrictions: No      Mobility  Bed Mobility Overal bed mobility: Modified Independent  General bed mobility comments: supine<>sit  Transfers Overall transfer level: Needs assistance Equipment used: None Transfers: Sit to/from Stand Sit to Stand: Supervision         General transfer comment: sit<>stand from EOB, toilet transfer with supervision and GB  Ambulation/Gait Ambulation/Gait assistance: Supervision Gait Distance (Feet): 12 Feet(x 2) Assistive device: None(intermittent UE support on counter/sink/doorway) Gait Pattern/deviations: Step-through pattern Gait velocity: decreased   General Gait Details: On room air, oxygen saturation not always with accurate pleth as  patient initially with O2 sensor off at start of session and sensor loose. Appeared to be in 90s% after ambulating to bathroom.        Balance Overall balance assessment: Needs assistance Sitting-balance support: No upper extremity supported Sitting balance-Leahy Scale: Good     Standing balance support: No upper extremity supported Standing balance-Leahy Scale: (Good-)        Pertinent Vitals/Pain Pain Assessment: Faces(when asked about pain states, "only pain is y'all" ) Faces Pain Scale: Hurts a little bit Pain Location: neck Pain Descriptors / Indicators: (sighing) Pain Intervention(s): Monitored during session;Limited activity within patient's tolerance    Home Living Family/patient expects to be discharged to:: Private residence Living Arrangements: Alone Available Help at Discharge: Other (Comment)(preacher lady)   Home Access: Level entry     Home Layout: One level Home Equipment: None Additional Comments: Patient with decreased tolerance for questions. States he lives alone, denies that there are any stairs, says preacher lady helps him with what he needs but would not elaborate on what those needs are.    Prior Function Level of Independence: Independent         Comments: Denies using AD PTA.        Extremity/Trunk Assessment        Lower Extremity Assessment Lower Extremity Assessment: Generalized weakness       Communication   Communication: No difficulties  Cognition Arousal/Alertness: Awake/alert      General Comments General comments (skin integrity, edema, etc.): O2 sat 88% sitting EOB on room air.         Assessment/Plan    PT Assessment Patient needs continued PT services  PT Problem List Decreased strength;Decreased activity tolerance;Decreased balance;Decreased mobility;Decreased safety awareness;Cardiopulmonary status limiting activity       PT Treatment Interventions DME  instruction;Gait training;Stair training;Functional  mobility training;Therapeutic activities;Therapeutic exercise;Balance training;Patient/family education    PT Goals (Current goals can be found in the Care Plan section)  Acute Rehab PT Goals Patient Stated Goal: to go home now Time For Goal Achievement: 10/18/19 Potential to Achieve Goals: Fair    Frequency Min 3X/week   Barriers to discharge Decreased caregiver support         AM-PAC PT "6 Clicks" Mobility  Outcome Measure Help needed turning from your back to your side while in a flat bed without using bedrails?: None Help needed moving from lying on your back to sitting on the side of a flat bed without using bedrails?: None Help needed moving to and from a bed to a chair (including a wheelchair)?: A Little Help needed standing up from a chair using your arms (e.g., wheelchair or bedside chair)?: A Little Help needed to walk in hospital room?: A Little Help needed climbing 3-5 steps with a railing? : A Little 6 Click Score: 20    End of Session   Activity Tolerance: Patient limited by fatigue;Other (comment)(limited by motivation) Patient left: in bed;with call bell/phone within reach Nurse Communication: Mobility status(no bed alarm needed per nurse) PT Visit Diagnosis: Unsteadiness on feet (R26.81);Other abnormalities of gait and mobility (R26.89)    Time: 0459-9774 PT Time Calculation (min) (ACUTE ONLY): 23 min   Charges:   PT Evaluation $PT Eval Low Complexity: [place], PT, DPT Acute Rehab [phone] office    Birdie Hopes 10/05/2019, 4:02 PM

## 2019-10-05 NOTE — Progress Notes (Signed)
[name] Progress Note  Background: patient presents back to the ED 3/25 with fever of 100 after leaving AMA 3/22 following recent COVID diagnosis and admission with related acute hypoxic respiratory failure. He had some hyperkalemia and volume excess due to missed dialysis during that admission.  He did go to dialysis for 2 of 4 hours 3/23 with a net UF of 1.1 and post wt 65.7 - edw was lowered to 64.5 for the next HD treatment as he reported SOB.  Repeat CXR reveals ? Fluid vs infection related changes.    Dialysis Orders: [facility] (currently at [facility] due to COVID status) 4h 64.5kg 2/2 bath 450/800 R arm AVG Hep none calcitriol 1   Assessment/Plan: 1. COVID 19/Fevers - per primary - 3/25 B Cx x2 NGTD.  2. ESRD /hyperkalemia- TTS while COVID; K up post HD today, Lokelma ordered. Needs consistent full treatments; HD tomorrow: 3.5h, 2K, 1-2L UF, No heparin, AVG 450/800 3. Anemia - hgb 8.7 - Mircera 60 given 3/23 4. Secondary hyperparathyroidism - calcitriol lowered recently due to high corrected Ca - continue 2 K 2 Ca bath 5. HTN/volume - some bradycardia - has a outpatient HD at times 6. Nutrition -renal diet/vits/needs supplements 7. Chronic Hep B - isolation dialysis 8. Pul HTN/severe MS/cor pulmonale.ascites -  9. Dialysis noncompliance 10. DNR   Rexene Agent  10/05/2019,10:14 AM  LOS: 1 day   Subjective:    HD yesterday 1.5L UF K 5.4 this AM; ordered Lokelma 10gm Pt denies complaint, not very interactive  Objective Vitals:   10/05/19 0326 10/05/19 0400 10/05/19 0821 10/05/19 1010  BP: (!) 111/59 111/70 (!) 142/72 (!) 123/54  Pulse: 75 62 87   Resp: 20 20 18    Temp: 98.5 F (36.9 C)     TempSrc: Oral     SpO2: 90% 90% 92%   Weight:      Height:       Physical Exam Physical exam: unable to complete due to COVID + status.  In order to preserve PPE equipment and to minimize exposure to providers.  Notes from other caregivers  reviewed  Dialysis Access: right upper AVGG   Additional Objective Labs: Basic Metabolic Panel: Recent Labs  Lab 09/29/19 0433 09/29/19 0433 09/30/19 0356 09/30/19 0744 10/01/19 0326 10/01/19 0326 10/01/19 0926 10/04/19 1022 10/05/19 0448  NA 132*   < > 133*   < > 133*   < > 131* 135 136  K 4.1   < > 4.1   < > 4.5   < > 4.5 6.2* 5.4*  CL 94*   < > 92*   < > 93*   < > 94* 93* 96*  CO2 26   < > 25   < > 22   < > 23 26 26   GLUCOSE 98   < > 89   < > 70   < > 77 74 99  BUN 26*   < > 37*   < > 49*   < > 53* 36* 21*  CREATININE 4.51*   < > 5.58*   < > 6.73*   < > 7.00* 6.84* 4.28*  CALCIUM 8.6*   < > 9.5   < > 9.1   < > 9.0 9.4 8.8*  PHOS 3.4  --  4.1  --  4.6  --   --   --   --    < > = values in this interval not displayed.   Liver Function Tests:  Recent Labs  Lab 10/01/19 0926 10/04/19 1022 10/05/19 0448  AST 49* 34 34  ALT 24 20 20   ALKPHOS 100 123 116  BILITOT 0.6 1.2 1.0  PROT 5.5* 6.0* 5.8*  ALBUMIN 2.5* 2.7* 2.7*   Recent Labs  Lab 10/04/19 1022  LIPASE 17   CBC: Recent Labs  Lab 09/30/19 0744 09/30/19 0744 10/01/19 0326 10/01/19 0326 10/01/19 0926 10/04/19 1022 10/05/19 0448  WBC 9.3   < > 10.0   < > 9.3 11.2* 7.8  NEUTROABS  --   --  8.0*  --  7.5  --   --   HGB 9.1*   < > 9.2*   < > 8.9* 8.4* 8.7*  HCT 25.8*   < > 25.7*   < > 24.2* 23.8* 24.7*  MCV 75.9*  --  77.9*  --  79.6* 82.1 81.8  PLT 108*   < > 143*   < > 129* 152 177   < > = values in this interval not displayed.   Blood Culture    Component Value Date/Time   SDES BLOOD RIGHT HAND 10/04/2019 1415   SPECREQUEST  10/04/2019 1415    BOTTLES DRAWN AEROBIC AND ANAEROBIC Blood Culture adequate volume   CULT  10/04/2019 1415    NO GROWTH < 24 HOURS Performed at [facility], [place] [address] [place], [place] [address]    REPTSTATUS PENDING 10/04/2019 1415    Cardiac Enzymes: No results for input(s): CKTOTAL, CKMB, CKMBINDEX, TROPONINI in the last 168 hours. CBG: Recent Labs   Lab 09/30/19 0034 09/30/19 0537 09/30/19 0942 09/30/19 1721 10/04/19 2137  GLUCAP 127* 86 101* 121* 115*   Iron Studies:  Recent Labs    10/04/19 1400  FERRITIN 3,045*   Lab Results  Component Value Date   INR 1.4 (H) 10/04/2019   INR 1.1 09/25/2019   INR 1.2 07/17/2019   Studies/Results: DG Chest Portable 1 View  Result Date: 10/04/2019 CLINICAL DATA:  Hypoxia. Additional history provided: Patient from home via EMS with lower abdominal pain, had dialysis yesterday, reportedly COVID positive 316 EXAM: PORTABLE CHEST 1 VIEW COMPARISON:  Chest radiograph 09/30/2019 FINDINGS: Unchanged cardiomegaly. Aortic atherosclerosis. Chronic elevation of the left hemidiaphragm. Similar appearance of patchy interstitial opacities most notable within the bilateral mid lung fields and left lung base. No sizable pleural effusion or evidence of pneumothorax. No acute bony abnormality. IMPRESSION: Similar appearance of patchy interstitial opacities most notable within the bilateral mid lung fields and left lung base. Findings may reflect asymmetric edema or sequela of atypical/viral infection. Unchanged cardiomegaly.  Aortic atherosclerosis. Electronically Signed   By: Kellie Simmering DO   On: 10/04/2019 11:33   ECHOCARDIOGRAM COMPLETE  Result Date: 10/04/2019    ECHOCARDIOGRAM REPORT   Patient Name:   [name] Date of Exam: 10/04/2019 Medical Rec #:  [id number]              Height:       69.0 in Accession #:    [id number]             Weight:       146.2 lb Date of Birth:  02-08-1961               BSA:          1.808 m Patient Age:    59 years               BP:  125/70 mmHg Patient Gender: M                      HR:           71 bpm. Exam Location:  Inpatient Procedure: 2D Echo Indications:    Dyspnea 786.09 / R06.00  History:        Patient has prior history of Echocardiogram examinations, most                 recent 08/30/2017. COPD, Arrythmias:Atrial Flutter and LBBB,                  Signs/Symptoms:Shortness of Breath; Risk Factors:Current Smoker                 and Hypertension. Alcohol abuse                 Fluid overload                 ESRD.  Sonographer:    Vikki Ports Turrentine Referring Phys: [id number] [place]  1. Low normal LV systolic function; severe LVH (myocardium with specked appearance; consider amyloid); mild to moderate AS (mean gradient 11 mmHg and AVA 1.3 cm2); mild AI; thickened MV with mild to moderate MS (mean gradient 6 mmHg and MVA 1.2-2 cm2; pt in atrial fibrillation at time of study); severe biatrial enlargement; mild RVE; severe RV dysfunction; severe TR with severe pulmonary hypertension.  2. Left ventricular ejection fraction, by estimation, is 50 to 55%. The left ventricle has low normal function. The left ventricle has no regional wall motion abnormalities. There is severe left ventricular hypertrophy. Left ventricular diastolic function could not be evaluated.  3. Right ventricular systolic function is severely reduced. The right ventricular size is mildly enlarged. There is severely elevated pulmonary artery systolic pressure.  4. Left atrial size was severely dilated.  5. Right atrial size was severely dilated.  6. Large pleural effusion in the left lateral region.  7. The mitral valve is normal in structure. Mild mitral valve regurgitation. Mild to moderate mitral stenosis.  8. Tricuspid valve regurgitation is severe.  9. The aortic valve is normal in structure. Aortic valve regurgitation is mild. Mild to moderate aortic valve stenosis. 10. The inferior vena cava is dilated in size with <50% respiratory variability, suggesting right atrial pressure of 15 mmHg. FINDINGS  Left Ventricle: Left ventricular ejection fraction, by estimation, is 50 to 55%. The left ventricle has low normal function. The left ventricle has no regional wall motion abnormalities. The left ventricular internal cavity size was normal in size. There is severe left ventricular  hypertrophy. Left ventricular diastolic function could not be evaluated due to atrial fibrillation. Left ventricular diastolic function could not be evaluated. Right Ventricle: The right ventricular size is mildly enlarged. Right ventricular systolic function is severely reduced. There is severely elevated pulmonary artery systolic pressure. The tricuspid regurgitant velocity is 4.64 m/s, and with an assumed right atrial pressure of 15 mmHg, the estimated right ventricular systolic pressure is 784.6 mmHg. Left Atrium: Left atrial size was severely dilated. Right Atrium: Right atrial size was severely dilated. Pericardium: A small pericardial effusion is present. Mitral Valve: There is moderate thickening of the mitral valve leaflet(s). Severe mitral annular calcification. Mild mitral valve regurgitation. Mild to moderate mitral valve stenosis. Tricuspid Valve: The tricuspid valve is normal in structure. Tricuspid valve regurgitation is severe. No evidence of tricuspid stenosis. Aortic Valve: The aortic valve is normal  in structure. Aortic valve regurgitation is mild. Mild to moderate aortic stenosis is present. Aortic valve mean gradient measures 10.0 mmHg. Aortic valve peak gradient measures 16.6 mmHg. Aortic valve area, by VTI measures 1.28 cm. Pulmonic Valve: The pulmonic valve was normal in structure. Pulmonic valve regurgitation is trivial. No evidence of pulmonic stenosis. Aorta: The aortic root is normal in size and structure. Venous: The inferior vena cava is dilated in size with less than 50% respiratory variability, suggesting right atrial pressure of 15 mmHg. IAS/Shunts: No atrial level shunt detected by color flow Doppler. Additional Comments: Low normal LV systolic function; severe LVH (myocardium with specked appearance; consider amyloid); mild to moderate AS (mean gradient 11 mmHg and AVA 1.3 cm2); mild AI; thickened MV with mild to moderate MS (mean gradient 6 mmHg and  MVA 1.2-2 cm2; pt in atrial  fibrillation at time of study); severe biatrial enlargement; mild RVE; severe RV dysfunction; severe TR with severe pulmonary hypertension. There is a large pleural effusion in the left lateral region.  LEFT VENTRICLE PLAX 2D LVIDd:         4.10 cm LVIDs:         2.70 cm LV PW:         1.60 cm LV IVS:        1.60 cm LVOT diam:     1.90 cm LV SV:         50 LV SV Index:   27 LVOT Area:     2.84 cm  LEFT ATRIUM              Index       RIGHT ATRIUM           Index LA diam:        5.10 cm  2.82 cm/m  RA Area:     39.60 cm LA Vol (A2C):   112.0 ml 61.94 ml/m RA Volume:   166.00 ml 91.80 ml/m LA Vol (A4C):   102.0 ml 56.41 ml/m LA Biplane Vol: 111.0 ml 61.39 ml/m  AORTIC VALVE AV Area (Vmax):    1.33 cm AV Area (Vmean):   1.20 cm AV Area (VTI):     1.28 cm AV Vmax:           204.00 cm/s AV Vmean:          146.500 cm/s AV VTI:            0.387 m AV Peak Grad:      16.6 mmHg AV Mean Grad:      10.0 mmHg LVOT Vmax:         95.80 cm/s LVOT Vmean:        61.800 cm/s LVOT VTI:          0.175 m LVOT/AV VTI ratio: 0.45  AORTA Ao Root diam: 3.10 cm TRICUSPID VALVE TR Peak grad:   86.1 mmHg TR Vmax:        464.00 cm/s  SHUNTS Systemic VTI:  0.18 m Systemic Diam: 1.90 cm Kirk Ruths MD Electronically signed by Kirk Ruths MD Signature Date/Time: 10/04/2019/4:14:14 PM    Final    Medications: . sodium chloride    . sodium chloride    . albumin human 12.5 g (10/05/19 0415)   . [START ON 10/06/2019] calcitRIOL  1 mcg Oral Q T,Th,Sa-HD  . Chlorhexidine Gluconate Cloth  6 each Topical Q0600  . dexamethasone (DECADRON) injection  10 mg Intravenous Once  . diltiazem  240 mg Oral Daily  .  fluticasone furoate-vilanterol  1 puff Inhalation Daily  . guaiFENesin  600 mg Oral BID  . heparin  5,000 Units Subcutaneous Q8H  . hydrALAZINE  100 mg Oral BID  . mouth rinse  15 mL Mouth Rinse BID  . metoprolol tartrate  100 mg Oral BID  . montelukast  10 mg Oral QPM  . pantoprazole  40 mg Oral Daily  . sevelamer  carbonate  1,600 mg Oral With snacks  . sevelamer carbonate  3,200 mg Oral TID with meals  . sodium chloride flush  3 mL Intravenous Once   I have seen and examined this patient and agree with plan and assessment in the above note with renal recommendations/intervention highlighted.  Pt readmitted after leaving [facility] 10/01/19.  He did go to outpatient HD on 10/02/19 but only ran for 2 hours.  With hyperkalemia and volume overload and plan for HD today.   Ayden Apodaca B Sada Mazzoni,MD 10/05/2019 10:14 AM

## 2019-10-05 NOTE — Progress Notes (Signed)
PT Cancellation Note  Patient Details Name: [name] MRN: [id number] DOB: 1961/04/15   Cancelled Treatment:    Reason Eval/Treat Not Completed: Other (comment)(patient pending BLE [place] due to severely elevated Ddimer)  PT to follow-up for evaluation as appropriate once imaging is completed.   Birdie Hopes 10/05/2019, 8:37 AM

## 2019-10-05 NOTE — Procedures (Signed)
PROCEDURE SUMMARY:  Successful US guided paracentesis from left lateral abdomen.  Yielded 2.8 liters of clear yellow fluid.  No immediate complications.  Patient tolerated well.  EBL = trace  Specimen was sent for labs.  Jovanny Stephanie S Fortune Brannigan PA-C 10/05/2019 12:03 PM

## 2019-10-05 NOTE — Progress Notes (Signed)
Bilateral lower extremity venous duplex exam.  Preliminary results can be found under CV proc under chart review.  10/05/2019 1:41 PM  Sorayah Schrodt, K., RDMS, RVT

## 2019-10-06 DIAGNOSIS — K7031 Alcoholic cirrhosis of liver with ascites: Secondary | ICD-10-CM

## 2019-10-06 LAB — COMPREHENSIVE METABOLIC PANEL
ALT: 18 U/L (ref 0–44)
AST: 22 U/L (ref 15–41)
Albumin: 2.6 g/dL — ABNORMAL LOW (ref 3.5–5.0)
Alkaline Phosphatase: 100 U/L (ref 38–126)
Anion gap: 14 (ref 5–15)
BUN: 29 mg/dL — ABNORMAL HIGH (ref 6–20)
CO2: 28 mmol/L (ref 22–32)
Calcium: 8.8 mg/dL — ABNORMAL LOW (ref 8.9–10.3)
Chloride: 94 mmol/L — ABNORMAL LOW (ref 98–111)
Creatinine, Ser: 6.31 mg/dL — ABNORMAL HIGH (ref 0.61–1.24)
GFR calc Af Amer: 10 mL/min — ABNORMAL LOW (ref >60)
GFR calc non Af Amer: 9 mL/min — ABNORMAL LOW (ref >60)
Glucose, Bld: 111 mg/dL — ABNORMAL HIGH (ref 70–99)
Potassium: 3.7 mmol/L (ref 3.5–5.1)
Sodium: 136 mmol/L (ref 135–145)
Total Bilirubin: 1.2 mg/dL (ref 0.3–1.2)
Total Protein: 5.5 g/dL — ABNORMAL LOW (ref 6.5–8.1)

## 2019-10-06 LAB — MAGNESIUM: Magnesium: 1.7 mg/dL (ref 1.7–2.4)

## 2019-10-06 LAB — RENAL FUNCTION PANEL
Albumin: 2.6 g/dL — ABNORMAL LOW (ref 3.5–5.0)
Anion gap: 12 (ref 5–15)
BUN: 29 mg/dL — ABNORMAL HIGH (ref 6–20)
CO2: 30 mmol/L (ref 22–32)
Calcium: 9 mg/dL (ref 8.9–10.3)
Chloride: 96 mmol/L — ABNORMAL LOW (ref 98–111)
Creatinine, Ser: 6.33 mg/dL — ABNORMAL HIGH (ref 0.61–1.24)
GFR calc Af Amer: 10 mL/min — ABNORMAL LOW (ref >60)
GFR calc non Af Amer: 9 mL/min — ABNORMAL LOW (ref >60)
Glucose, Bld: 112 mg/dL — ABNORMAL HIGH (ref 70–99)
Phosphorus: 2.9 mg/dL (ref 2.5–4.6)
Potassium: 4.1 mmol/L (ref 3.5–5.1)
Sodium: 138 mmol/L (ref 135–145)

## 2019-10-06 LAB — C-REACTIVE PROTEIN: CRP: 9.3 mg/dL — ABNORMAL HIGH (ref <1.0)

## 2019-10-06 LAB — GLUCOSE, CAPILLARY: Glucose-Capillary: 108 mg/dL — ABNORMAL HIGH (ref 70–99)

## 2019-10-06 LAB — D-DIMER, QUANTITATIVE: D-Dimer, Quant: 18.32 ug/mL-FEU — ABNORMAL HIGH (ref 0.00–0.50)

## 2019-10-06 LAB — BRAIN NATRIURETIC PEPTIDE: B Natriuretic Peptide: 2690.1 pg/mL — ABNORMAL HIGH (ref 0.0–100.0)

## 2019-10-06 MED ORDER — CALCITRIOL 0.5 MCG PO CAPS
ORAL_CAPSULE | ORAL | Status: AC
  Filled 2019-10-06: qty 2

## 2019-10-06 NOTE — Progress Notes (Signed)
[facility] Progress Note    Dialysis Orders: MWF [facility] (currently at [facility] due to COVID status) 4h 64.5kg 2/2 bath 450/800 R arm AVG Hep none calcitriol 1   Assessment/Plan: 1. COVID 19/Fevers - per primary - 3/25 B Cx x2 NGTD.  2. ESRD /hyperkalemia- TTS while COVID; K up post HD 3/26 and rec [place]. Needs consistent full treatments; HD today offered to pt: 3.5h, 2K, 1-2L UF, No heparin, AVG 450/800 3. Anemia - hgb 8.7 - Mircera 60 given 3/23 4. Secondary hyperparathyroidism - calcitriol lowered recently due to high corrected Ca - continue 2 K 2 Ca bath 5. HTN/volume - some bradycardia - has a outpatient HD at times 6. Nutrition -renal diet/vits/needs supplements 7. Chronic Hep B - isolation dialysis 8. Pul HTN/severe MS/cor pulmonale.ascites -  9. Dialysis noncompliance 10. DNR   Rexene Agent  10/06/2019,7:56 AM  LOS: 2 days   Subjective:    2.8L paracentesis yesterday Says he will refuse HD today, discussed, encouraged against Refused AM labs  Objective Vitals:   10/05/19 1650 10/05/19 2103 10/05/19 2104 10/06/19 0528  BP: (!) 120/50 (!) 118/102 (!) 115/36 124/63  Pulse:  83 81   Resp: 16 (!) 25 14 (!) 22  Temp: 99.3 F (37.4 C)   98.5 F (36.9 C)  TempSrc: Oral   Oral  SpO2:  96% 96%   Weight:      Height:       Physical Exam Physical exam: unable to complete due to COVID + status.  In order to preserve PPE equipment and to minimize exposure to providers.  Notes from other caregivers reviewed  Dialysis Access: right upper AVGG   Additional Objective Labs: Basic Metabolic Panel: Recent Labs  Lab 09/30/19 0356 09/30/19 0744 10/01/19 0326 10/01/19 0326 10/01/19 0926 10/04/19 1022 10/05/19 0448  NA 133*   < > 133*   < > 131* 135 136  K 4.1   < > 4.5   < > 4.5 6.2* 5.4*  CL 92*   < > 93*   < > 94* 93* 96*  CO2 25   < > 22   < > 23 26 26   GLUCOSE 89   < > 70   < > 77 74 99  BUN 37*   < > 49*   < > 53* 36* 21*  CREATININE  5.58*   < > 6.73*   < > 7.00* 6.84* 4.28*  CALCIUM 9.5   < > 9.1   < > 9.0 9.4 8.8*  PHOS 4.1  --  4.6  --   --   --   --    < > = values in this interval not displayed.   Liver Function Tests: Recent Labs  Lab 10/01/19 0926 10/04/19 1022 10/05/19 0448  AST 49* 34 34  ALT 24 20 20   ALKPHOS 100 123 116  BILITOT 0.6 1.2 1.0  PROT 5.5* 6.0* 5.8*  ALBUMIN 2.5* 2.7* 2.7*   Recent Labs  Lab 10/04/19 1022  LIPASE 17   CBC: Recent Labs  Lab 09/30/19 0744 09/30/19 0744 10/01/19 0326 10/01/19 0326 10/01/19 0926 10/04/19 1022 10/05/19 0448  WBC 9.3   < > 10.0   < > 9.3 11.2* 7.8  NEUTROABS  --   --  8.0*  --  7.5  --   --   HGB 9.1*   < > 9.2*   < > 8.9* 8.4* 8.7*  HCT 25.8*   < > 25.7*   < >  24.2* 23.8* 24.7*  MCV 75.9*  --  77.9*  --  79.6* 82.1 81.8  PLT 108*   < > 143*   < > 129* 152 177   < > = values in this interval not displayed.   Blood Culture    Component Value Date/Time   SDES FLUID 10/05/2019 0323   SPECREQUEST NONE 10/05/2019 0323   CULT  10/04/2019 1415    NO GROWTH < 24 HOURS Performed at [facility], [place] [address], [place], [place] [address]    REPTSTATUS 10/05/2019 FINAL 10/05/2019 0323    Cardiac Enzymes: No results for input(s): CKTOTAL, CKMB, CKMBINDEX, TROPONINI in the last 168 hours. CBG: Recent Labs  Lab 09/30/19 0034 09/30/19 0537 09/30/19 0942 09/30/19 1721 10/04/19 2137  GLUCAP 127* 86 101* 121* 115*   Iron Studies:  Recent Labs    10/05/19 0826  FERRITIN 1,851*   Lab Results  Component Value Date   INR 1.4 (H) 10/04/2019   INR 1.1 09/25/2019   INR 1.2 07/17/2019   Studies/Results: DG Chest Portable 1 View  Result Date: 10/04/2019 CLINICAL DATA:  Hypoxia. Additional history provided: Patient from home via EMS with lower abdominal pain, had dialysis yesterday, reportedly COVID positive 316 EXAM: PORTABLE CHEST 1 VIEW COMPARISON:  Chest radiograph 09/30/2019 FINDINGS: Unchanged cardiomegaly. Aortic atherosclerosis.  Chronic elevation of the left hemidiaphragm. Similar appearance of patchy interstitial opacities most notable within the bilateral mid lung fields and left lung base. No sizable pleural effusion or evidence of pneumothorax. No acute bony abnormality. IMPRESSION: Similar appearance of patchy interstitial opacities most notable within the bilateral mid lung fields and left lung base. Findings may reflect asymmetric edema or sequela of atypical/viral infection. Unchanged cardiomegaly.  Aortic atherosclerosis. Electronically Signed   By: Kellie Simmering DO   On: 10/04/2019 11:33   ECHOCARDIOGRAM COMPLETE  Result Date: 10/04/2019    ECHOCARDIOGRAM REPORT   Patient Name:   [name] Date of Exam: 10/04/2019 Medical Rec #:  [id number]              Height:       69.0 in Accession #:    [id number]             Weight:       146.2 lb Date of Birth:  Nov 18, 1960               BSA:          1.808 m Patient Age:    59 years               BP:           125/70 mmHg Patient Gender: M                      HR:           71 bpm. Exam Location:  Inpatient Procedure: 2D Echo Indications:    Dyspnea 786.09 / R06.00  History:        Patient has prior history of Echocardiogram examinations, most                 recent 08/30/2017. COPD, Arrythmias:Atrial Flutter and LBBB,                 Signs/Symptoms:Shortness of Breath; Risk Factors:Current Smoker                 and Hypertension. Alcohol abuse  Fluid overload                 ESRD.  Sonographer:    Vikki Ports Turrentine Referring Phys: [id number] [place]  1. Low normal LV systolic function; severe LVH (myocardium with specked appearance; consider amyloid); mild to moderate AS (mean gradient 11 mmHg and AVA 1.3 cm2); mild AI; thickened MV with mild to moderate MS (mean gradient 6 mmHg and MVA 1.2-2 cm2; pt in atrial fibrillation at time of study); severe biatrial enlargement; mild RVE; severe RV dysfunction; severe TR with severe pulmonary hypertension.   2. Left ventricular ejection fraction, by estimation, is 50 to 55%. The left ventricle has low normal function. The left ventricle has no regional wall motion abnormalities. There is severe left ventricular hypertrophy. Left ventricular diastolic function could not be evaluated.  3. Right ventricular systolic function is severely reduced. The right ventricular size is mildly enlarged. There is severely elevated pulmonary artery systolic pressure.  4. Left atrial size was severely dilated.  5. Right atrial size was severely dilated.  6. Large pleural effusion in the left lateral region.  7. The mitral valve is normal in structure. Mild mitral valve regurgitation. Mild to moderate mitral stenosis.  8. Tricuspid valve regurgitation is severe.  9. The aortic valve is normal in structure. Aortic valve regurgitation is mild. Mild to moderate aortic valve stenosis. 10. The inferior vena cava is dilated in size with <50% respiratory variability, suggesting right atrial pressure of 15 mmHg. FINDINGS  Left Ventricle: Left ventricular ejection fraction, by estimation, is 50 to 55%. The left ventricle has low normal function. The left ventricle has no regional wall motion abnormalities. The left ventricular internal cavity size was normal in size. There is severe left ventricular hypertrophy. Left ventricular diastolic function could not be evaluated due to atrial fibrillation. Left ventricular diastolic function could not be evaluated. Right Ventricle: The right ventricular size is mildly enlarged. Right ventricular systolic function is severely reduced. There is severely elevated pulmonary artery systolic pressure. The tricuspid regurgitant velocity is 4.64 m/s, and with an assumed right atrial pressure of 15 mmHg, the estimated right ventricular systolic pressure is 388.8 mmHg. Left Atrium: Left atrial size was severely dilated. Right Atrium: Right atrial size was severely dilated. Pericardium: A small pericardial effusion  is present. Mitral Valve: There is moderate thickening of the mitral valve leaflet(s). Severe mitral annular calcification. Mild mitral valve regurgitation. Mild to moderate mitral valve stenosis. Tricuspid Valve: The tricuspid valve is normal in structure. Tricuspid valve regurgitation is severe. No evidence of tricuspid stenosis. Aortic Valve: The aortic valve is normal in structure. Aortic valve regurgitation is mild. Mild to moderate aortic stenosis is present. Aortic valve mean gradient measures 10.0 mmHg. Aortic valve peak gradient measures 16.6 mmHg. Aortic valve area, by VTI measures 1.28 cm. Pulmonic Valve: The pulmonic valve was normal in structure. Pulmonic valve regurgitation is trivial. No evidence of pulmonic stenosis. Aorta: The aortic root is normal in size and structure. Venous: The inferior vena cava is dilated in size with less than 50% respiratory variability, suggesting right atrial pressure of 15 mmHg. IAS/Shunts: No atrial level shunt detected by color flow Doppler. Additional Comments: Low normal LV systolic function; severe LVH (myocardium with specked appearance; consider amyloid); mild to moderate AS (mean gradient 11 mmHg and AVA 1.3 cm2); mild AI; thickened MV with mild to moderate MS (mean gradient 6 mmHg and  MVA 1.2-2 cm2; pt in atrial fibrillation at time of study); severe biatrial  enlargement; mild RVE; severe RV dysfunction; severe TR with severe pulmonary hypertension. There is a large pleural effusion in the left lateral region.  LEFT VENTRICLE PLAX 2D LVIDd:         4.10 cm LVIDs:         2.70 cm LV PW:         1.60 cm LV IVS:        1.60 cm LVOT diam:     1.90 cm LV SV:         50 LV SV Index:   27 LVOT Area:     2.84 cm  LEFT ATRIUM              Index       RIGHT ATRIUM           Index LA diam:        5.10 cm  2.82 cm/m  RA Area:     39.60 cm LA Vol (A2C):   112.0 ml 61.94 ml/m RA Volume:   166.00 ml 91.80 ml/m LA Vol (A4C):   102.0 ml 56.41 ml/m LA Biplane Vol: 111.0  ml 61.39 ml/m  AORTIC VALVE AV Area (Vmax):    1.33 cm AV Area (Vmean):   1.20 cm AV Area (VTI):     1.28 cm AV Vmax:           204.00 cm/s AV Vmean:          146.500 cm/s AV VTI:            0.387 m AV Peak Grad:      16.6 mmHg AV Mean Grad:      10.0 mmHg LVOT Vmax:         95.80 cm/s LVOT Vmean:        61.800 cm/s LVOT VTI:          0.175 m LVOT/AV VTI ratio: 0.45  AORTA Ao Root diam: 3.10 cm TRICUSPID VALVE TR Peak grad:   86.1 mmHg TR Vmax:        464.00 cm/s  SHUNTS Systemic VTI:  0.18 m Systemic Diam: 1.90 cm Kirk Ruths MD Electronically signed by Kirk Ruths MD Signature Date/Time: 10/04/2019/4:14:14 PM    Final    VAS [place] LOWER EXTREMITY VENOUS (DVT)  Result Date: 10/05/2019  Lower Venous DVTStudy Indications: Covid+, Elevated D-dimer.  Risk Factors: ESRD, Liver cirrhosis. Limitations: Shadowing from arteries. Comparison Study: No prior exam. Performing Technologist: Baldwin Crown ARDMS, RVT  Examination Guidelines: A complete evaluation includes B-mode imaging, spectral Doppler, color Doppler, and power Doppler as needed of all accessible portions of each vessel. Bilateral testing is considered an integral part of a complete examination. Limited examinations for reoccurring indications may be performed as noted. The reflux portion of the exam is performed with the patient in reverse Trendelenburg.  +---------+---------------+---------+-----------+----------+--------------+ RIGHT    CompressibilityPhasicitySpontaneityPropertiesThrombus Aging +---------+---------------+---------+-----------+----------+--------------+ CFV      Full           Yes      Yes                                 +---------+---------------+---------+-----------+----------+--------------+ SFJ      Full                                                        +---------+---------------+---------+-----------+----------+--------------+  FV Prox  Full                                                         +---------+---------------+---------+-----------+----------+--------------+ FV Mid   Full                                                        +---------+---------------+---------+-----------+----------+--------------+ FV DistalFull                                                        +---------+---------------+---------+-----------+----------+--------------+ PFV      Full                                                        +---------+---------------+---------+-----------+----------+--------------+ POP      Full           Yes      Yes                                 +---------+---------------+---------+-----------+----------+--------------+ PTV      Full                                                        +---------+---------------+---------+-----------+----------+--------------+ PERO     Full                                                        +---------+---------------+---------+-----------+----------+--------------+   +---------+---------------+---------+-----------+----------+--------------+ LEFT     CompressibilityPhasicitySpontaneityPropertiesThrombus Aging +---------+---------------+---------+-----------+----------+--------------+ CFV      Full           Yes      Yes                                 +---------+---------------+---------+-----------+----------+--------------+ SFJ      Full                                                        +---------+---------------+---------+-----------+----------+--------------+ FV Prox  Full                                                        +---------+---------------+---------+-----------+----------+--------------+  FV Mid   Full                                                        +---------+---------------+---------+-----------+----------+--------------+ FV DistalFull                                                         +---------+---------------+---------+-----------+----------+--------------+ PFV      Full                                                        +---------+---------------+---------+-----------+----------+--------------+ POP      Full           Yes      Yes                                 +---------+---------------+---------+-----------+----------+--------------+ PTV      Full                                                        +---------+---------------+---------+-----------+----------+--------------+ PERO     Full                                                        +---------+---------------+---------+-----------+----------+--------------+     Summary: BILATERAL: - No evidence of deep vein thrombosis seen in the lower extremities, bilaterally.  RIGHT: - No cystic structure found in the popliteal fossa.  LEFT: - No cystic structure found in the popliteal fossa.  *See table(s) above for measurements and observations. Electronically signed by [name] MD on 10/05/2019 at 4:51:21 PM.    Final    IR Paracentesis  Result Date: 10/05/2019 INDICATION: Hepatitis C cirrhosis, end-stage renal disease, and recurrent ascites. Request for therapeutic and diagnostic paracentesis. EXAM: ULTRASOUND GUIDED PARACENTESIS MEDICATIONS: 1% lidocaine 15 mL COMPLICATIONS: None immediate. PROCEDURE: Informed written consent was obtained from the patient after a discussion of the risks, benefits and alternatives to treatment. A timeout was performed prior to the initiation of the procedure. Initial ultrasound scanning demonstrates a moderate amount of ascites within the left lower abdominal quadrant. The left lower abdomen was prepped and draped in the usual sterile fashion. 1% lidocaine was used for local anesthesia. Following this, a 19 gauge, 7-cm, Yueh catheter was introduced. An ultrasound image was saved for documentation purposes. The paracentesis was performed. The catheter was removed  and a dressing was applied. The patient tolerated the procedure well without immediate post procedural complication. Patient received post-procedure intravenous albumin; see nursing notes for details. FINDINGS: A total of approximately 2.8 L of clear yellow fluid was removed. Samples were  sent to the laboratory as requested by the clinical team. IMPRESSION: Successful ultrasound-guided paracentesis yielding 2.8 liters of peritoneal fluid. Read by: Gareth Eagle, PA-C Electronically Signed   By: Lucrezia Europe M.D.   On: 10/05/2019 12:03   Medications: . sodium chloride    . sodium chloride     . calcitRIOL  1 mcg Oral Q T,Th,Sa-HD  . Chlorhexidine Gluconate Cloth  6 each Topical Q0600  . diltiazem  240 mg Oral Daily  . fluticasone furoate-vilanterol  1 puff Inhalation Daily  . guaiFENesin  600 mg Oral BID  . heparin  5,000 Units Subcutaneous Q8H  . hydrALAZINE  100 mg Oral BID  . mouth rinse  15 mL Mouth Rinse BID  . metoprolol tartrate  100 mg Oral BID  . montelukast  10 mg Oral QPM  . sevelamer carbonate  1,600 mg Oral With snacks  . sevelamer carbonate  3,200 mg Oral TID with meals  . sodium chloride flush  3 mL Intravenous Once  . vancomycin  125 mg Oral QID   I have seen and examined this patient and agree with plan and assessment in the above note with renal recommendations/intervention highlighted.  Pt readmitted after leaving [facility] 10/01/19.  He did go to outpatient HD on 10/02/19 but only ran for 2 hours.  With hyperkalemia and volume overload and plan for HD today.   Meredith Leeds Raney Koeppen,MD 10/06/2019 7:56 AM

## 2019-10-06 NOTE — Progress Notes (Signed)
Pt cut off 30 mins d/t tired in HD. Pt refused to complete treatment. Dr. Joelyn Oms notified and report given to L. Demaris Callander.

## 2019-10-06 NOTE — Progress Notes (Signed)
Pt refused dialysis this A.M. after nephrology visit. After Dr. Candiss Norse saw pt he is now agreeable to dialysis.

## 2019-10-06 NOTE — Progress Notes (Signed)
Pt returned to floor after dialysis stating "I need my walking papers." Pt says that he is leaving and wants his AMA papers. AMA paper signed by the patient and this RN. Counciled on the importance of staying in the hospital at this time. Pt ultimately decided to leave AMA.

## 2019-10-06 NOTE — Progress Notes (Signed)
PROGRESS NOTE                                                                                                                                                                                                             Patient Demographics:    [name], is a 59 y.o. male, DOB - 09-Oct-1960, EHU:[id number]  Outpatient Primary MD for the patient is [name], FNP   Admit date - 10/04/2019   LOS - 2  Chief Complaint  Patient presents with  . Abdominal Pain       Brief Narrative: Patient is a 59 y.o. male with PMHx of ESRD on HD, liver cirrhosis with recurrent ascites requiring serial paracentesis, hepatitis C, PAF not on anticoagulation, HTN, chronic pain syndrome, CAD s/p PCI to RCA in 2018,  history of cocaine/marijuana abuse-who was admitted to this facility from 3/16-3/22 for acute hypoxic respiratory failure from volume overload due to missed dialysis and COVID-19 pneumonia-he was also found to have C. difficile colitis-he unfortunately signed out AMA on 3/22-presented to the ED on 3/25 with acute hypoxic respiratory failure and abdominal pain.  See below for further details.  Significant Events: 3/16-3/22: Admit to [facility] for hypoxia from fluid overload due to missed dialysis/COVID-19 and C. difficile colitis.  Patient signed out AMA. 3/25: Presented to the ED with hypoxia and abdominal pain. 3/25>> TTE-EF 50-55%, severely reduced RV systolic function, mild/moderate MS, mild/moderate AAS  COVID-19 medications: Steroids: 3/16>> 3/21 Remdesivir: 3/16>> 3/21  Antibiotics: Oral vancomycin: 3/26>>  Microbiology data: 3/25: Blood culture>> negative so far  DVT prophylaxis: SQ heparin  Procedures: None  Consults: None    Subjective:   Patient in bed, appears comfortable, denies any headache, no fever, no chest pain or pressure, no shortness of breath , no abdominal pain. No focal weakness.  Unfortunately  this patient left AMA few days ago, he was threatening to leave AMA yesterday on 10/05/2019 but finally agreed to stay, this morning when the nephrologist came to take him for dialysis he refused dialysis and threatened to leave AMA again.  After I counseled him for a prolonged.  Of time he has agreed to stay in the hospital and undergo dialysis, I have explained to him clearly that his noncompliance with treatment and signing out AMA persistently may result in stroke, disability or even death.  Have also left a detailed message for his sister on the listed phone number on 10/06/2019 at 11:10 AM.   Assessment  & Plan :   Acute Hypoxic Resp Failure: Felt to be secondary to fluid overload in the setting of ESRD-not completing full treatment in the outpatient setting.  Underwent HD on 3/25-improved-and on room air this morning.  I doubt patient has active Covid pneumonia at this time.  Covid 19 pneumonia: Treated appropriately during last admission-CRP significantly elevated-could be from fluid overload/abdominal pain.  He is on room air this morning-and has improved post hemodialysis-suspect does not need any further treatment-continue to observe for now.  Fever: afebrile  O2 requirements:  SpO2: 96 % O2 Flow Rate (L/min): 2 L/min   COVID-19 Labs: Recent Labs    10/04/19 1400 10/05/19 0826  DDIMER >20.00* >20.00*  FERRITIN 3,045* 1,851*  LDH 317*  --   CRP 16.0* 13.7*       Component Value Date/Time   BNP 3,065.2 (H) 09/30/2019 0744    Recent Labs  Lab 10/04/19 1400  PROCALCITON 2.05    Lab Results  Component Value Date   SARSCOV2NAA POSITIVE (A) 09/25/2019   [place] NEGATIVE 08/02/2019   [place] NEGATIVE 07/29/2019   [place] NEGATIVE 07/17/2019     Prone/Incentive Spirometry: encouraged  incentive spirometry use 3-4/hour.  Elevated D-dimer: Negative bilateral lower extremity Dopplers-he is on room air this morning-therefore doubt PE.  Likely his D-dimer  elevation is chronic and nonspecific.  Abdominal pain: Abdominal exam benign-pain has currently resolved.  Recent history of C. difficile colitis: Denies diarrhea-reviewed last discharge summary-was supposed to be on vancomycin x1 additional week.  Poor historian-I doubt compliance-resume vancomycin while he is in the hospital.  Have stopped PPI today.  ESRD on HD TTS: Nephrology following-defer to nephrology.  Hyperkalemia: Continues to have mild hyperkalemia-start Lokelma-defer HD to renal.  Anemia: Secondary to ESRD-defer Aranesp/iron to nephrology.  CAD s/p PCI to RCA in 2018: No anginal symptoms-supportive care  PAF: Rate controlled with Cardizem and metoprolol-suspect due to noncompliance-not a good long-term anticoagulation candidate.  COPD/cor pulmonale: Stable-continue bronchodilators.  TTE with significantly dilated RV and poor RV systolic function which I suspect chronic given his overall clinical stability today.  HTN: Controlled-continue Cardizem, metoprolol  QTC prolongation: Noted at admission-however patient has a wide QRS complex at baseline.  Monitor in telemetry.  Liver cirrhosis/hep C: Apart from ascites-appears well compensated.  RN pressure injury documentation: Pressure Injury 10/04/19 Coccyx Stage 2 -  Partial thickness loss of dermis presenting as a shallow open injury with a red, pink wound bed without slough. (Active)  10/04/19 2100  Location: Coccyx  Location Orientation:   Staging: Stage 2 -  Partial thickness loss of dermis presenting as a shallow open injury with a red, pink wound bed without slough.  Wound Description (Comments):   Present on Admission: Yes    Continued and persistent noncompliance with treatment.  Missing HD, refusing HD, signing out AMA.  Has been clearly counseled by me on 10/06/2019 that this kind of behavior may result in disability, death, stroke etc.  Also detailed message left for patient's sister on 10/06/2019.  Will also  involve palliative care for future goals of care in the light of continued noncompliance with treatment including HD.  Also discussed with nephrologist Dr. Joelyn Oms who agrees with the plan.  Clinically he has capacity to decide for himself.     Condition -  Guarded  Family Communication  : Left a voicemail for sister  on 10/06/2019 at 11:10 AM.  Code Status : DNR  Diet :  Diet Order            Diet renal/carb modified with fluid restriction Diet-HS Snack? Nothing; Fluid restriction: 1200 mL Fluid; Room service appropriate? Yes; Fluid consistency: Thin  Diet effective now               Disposition Plan  :  SNF vs Home health when ready for discharge  Barriers to discharge: Improving hypoxia-persistent hyperkalemia-needs inpatient hemodialysis tomorrow.  Awaiting paracentesis to rule out SBP.  Antimicorbials  :    Anti-infectives (From admission, onward)   Start     Dose/Rate Route Frequency Ordered Stop   10/05/19 1215  vancomycin (VANCOCIN) 50 mg/mL oral solution 125 mg     125 mg Oral 4 times daily 10/05/19 1112 10/12/19 1359      Inpatient Medications  Scheduled Meds: . calcitRIOL  1 mcg Oral Q T,Th,Sa-HD  . Chlorhexidine Gluconate Cloth  6 each Topical Q0600  . diltiazem  240 mg Oral Daily  . fluticasone furoate-vilanterol  1 puff Inhalation Daily  . guaiFENesin  600 mg Oral BID  . heparin  5,000 Units Subcutaneous Q8H  . hydrALAZINE  100 mg Oral BID  . mouth rinse  15 mL Mouth Rinse BID  . metoprolol tartrate  100 mg Oral BID  . montelukast  10 mg Oral QPM  . sevelamer carbonate  1,600 mg Oral With snacks  . sevelamer carbonate  3,200 mg Oral TID with meals  . sodium chloride flush  3 mL Intravenous Once  . vancomycin  125 mg Oral QID   Continuous Infusions: . sodium chloride    . sodium chloride     PRN Meds:.sodium chloride, sodium chloride, albuterol, hydrOXYzine, lactulose, lidocaine (PF), lidocaine, lidocaine-prilocaine, metoCLOPramide (REGLAN) injection,  oxyCODONE, pentafluoroprop-tetrafluoroeth, zolpidem   Time Spent in minutes  35     See all Orders from today for further details   Lala Lund M.D on 10/06/2019 at 11:08 AM  To page go to [email or web address] - use universal password  [facility] -  Office  [phone]    Objective:   Vitals:   10/05/19 2103 10/05/19 2104 10/06/19 0528 10/06/19 1000  BP: (!) 118/102 (!) 115/36 124/63 (!) 137/54  Pulse: 83 81  (!) 53  Resp: (!) 25 14 (!) 22   Temp:   98.5 F (36.9 C)   TempSrc:   Oral   SpO2: 96% 96%    Weight:      Height:        Wt Readings from Last 3 Encounters:  10/05/19 67.4 kg  10/01/19 66.3 kg  09/23/19 63.5 kg     Intake/Output Summary (Last 24 hours) at 10/06/2019 1108 Last data filed at 10/06/2019 0900 Gross per 24 hour  Intake 653.42 ml  Output --  Net 653.42 ml     Physical Exam  Awake Alert, No new F.N deficits, Normal affect L[place].AT,PERRAL Supple Neck,No JVD, No cervical lymphadenopathy appriciated.  Symmetrical Chest wall movement, Good air movement bilaterally, CTAB RRR,No Gallops, Rubs or new Murmurs, No Parasternal Heave +ve B.Sounds, Abd Soft, No tenderness, No organomegaly appriciated, No rebound - guarding or rigidity. No Cyanosis, Clubbing or edema, No new Rash or bruise    Data Review:    CBC Recent Labs  Lab 09/30/19 0744 10/01/19 0326 10/01/19 0926 10/04/19 1022 10/05/19 0448  WBC 9.3 10.0 9.3 11.2* 7.8  HGB 9.1* 9.2* 8.9* 8.4* 8.7*  HCT 25.8*  25.7* 24.2* 23.8* 24.7*  PLT 108* 143* 129* 152 177  MCV 75.9* 77.9* 79.6* 82.1 81.8  MCH 26.8 27.9 29.3 29.0 28.8  MCHC 35.3 35.8 36.8* 35.3 35.2  RDW 14.6 14.6 14.6 16.5* 17.4*  LYMPHSABS  --  1.1 1.0  --   --   MONOABS  --  0.7 0.7  --   --   EOSABS  --  0.0 0.0  --   --   BASOSABS  --  0.0 0.0  --   --     Chemistries  Recent Labs  Lab 09/30/19 0744 10/01/19 0326 10/01/19 0926 10/04/19 1022 10/05/19 0448  NA 132* 133* 131* 135 136  K 4.1 4.5 4.5 6.2* 5.4*   CL 93* 93* 94* 93* 96*  CO2 23 22 23 26 26   GLUCOSE 84 70 77 74 99  BUN 39* 49* 53* 36* 21*  CREATININE 5.81* 6.73* 7.00* 6.84* 4.28*  CALCIUM 9.4 9.1 9.0 9.4 8.8*  MG 1.6* 1.9 2.0  --   --   AST  --  53* 49* 34 34  ALT  --  24 24 20 20   ALKPHOS  --  110 100 123 116  BILITOT  --  0.6 0.6 1.2 1.0   ------------------------------------------------------------------------------------------------------------------ Recent Labs    10/04/19 1400  TRIG 183*    Lab Results  Component Value Date   HGBA1C 4.7 (L) 09/26/2019   ------------------------------------------------------------------------------------------------------------------ No results for input(s): TSH, T4TOTAL, T3FREE, THYROIDAB in the last 72 hours.  Invalid input(s): FREET3 ------------------------------------------------------------------------------------------------------------------ Recent Labs    10/04/19 1400 10/05/19 0826  FERRITIN 3,045* 1,851*    Coagulation profile Recent Labs  Lab 10/04/19 1400  INR 1.4*    Recent Labs    10/04/19 1400 10/05/19 0826  DDIMER >20.00* >20.00*    Cardiac Enzymes No results for input(s): CKMB, TROPONINI, MYOGLOBIN in the last 168 hours.  Invalid input(s): CK ------------------------------------------------------------------------------------------------------------------    Component Value Date/Time   BNP 3,065.2 (H) 09/30/2019 6720    Micro Results Recent Results (from the past 240 hour(s))  Blood Culture (routine x 2)     Status: None (Preliminary result)   Collection Time: 10/04/19  2:00 PM   Specimen: BLOOD RIGHT ARM  Result Value Ref Range Status   Specimen Description BLOOD RIGHT ARM  Final   Special Requests   Final    BOTTLES DRAWN AEROBIC AND ANAEROBIC Blood Culture adequate volume   Culture   Final    NO GROWTH < 24 HOURS Performed at [facility], [place] [address], [place], [place] [address]    Report Status PENDING  Incomplete   Blood Culture (routine x 2)     Status: None (Preliminary result)   Collection Time: 10/04/19  2:15 PM   Specimen: BLOOD RIGHT HAND  Result Value Ref Range Status   Specimen Description BLOOD RIGHT HAND  Final   Special Requests   Final    BOTTLES DRAWN AEROBIC AND ANAEROBIC Blood Culture adequate volume   Culture   Final    NO GROWTH < 24 HOURS Performed at [facility], [place] [address] [place], [place] [address]    Report Status PENDING  Incomplete  Gram stain     Status: None   Collection Time: 10/05/19  3:23 AM   Specimen: Fluid  Result Value Ref Range Status   Specimen Description FLUID  Final   Special Requests NONE  Final   Gram Stain   Final    CYTOSPIN  SMEAR WBC PRESENT,BOTH PMN AND MONONUCLEAR NO ORGANISMS SEEN Performed at [facility], [place] [address], [place], [place] [address]    Report Status 10/05/2019 FINAL  Final    Radiology Reports DG Chest Portable 1 View  Result Date: 10/04/2019 CLINICAL DATA:  Hypoxia. Additional history provided: Patient from home via EMS with lower abdominal pain, had dialysis yesterday, reportedly COVID positive 316 EXAM: PORTABLE CHEST 1 VIEW COMPARISON:  Chest radiograph 09/30/2019 FINDINGS: Unchanged cardiomegaly. Aortic atherosclerosis. Chronic elevation of the left hemidiaphragm. Similar appearance of patchy interstitial opacities most notable within the bilateral mid lung fields and left lung base. No sizable pleural effusion or evidence of pneumothorax. No acute bony abnormality. IMPRESSION: Similar appearance of patchy interstitial opacities most notable within the bilateral mid lung fields and left lung base. Findings may reflect asymmetric edema or sequela of atypical/viral infection. Unchanged cardiomegaly.  Aortic atherosclerosis. Electronically Signed   By: Kellie Simmering DO   On: 10/04/2019 11:33   DG Chest Port 1 View  Result Date: 09/30/2019 CLINICAL DATA:  Shortness of breath.  Abdominal distention. EXAM:  PORTABLE CHEST 1 VIEW COMPARISON:  September 27, 2019 FINDINGS: Patchy interstitial and ground-glass opacities are seen in the lungs, right greater than left. Cardiomegaly. The hila and mediastinum are normal. No pneumothorax. No other acute abnormalities. IMPRESSION: Patchy interstitial and ground-glass opacities may represent asymmetric edema versus atypical infection. No other acute abnormalities. Electronically Signed   By: Dorise Bullion III M.D   On: 09/30/2019 08:43   DG Chest Port 1 View  Result Date: 09/27/2019 CLINICAL DATA:  Acute respiratory failure. EXAM: PORTABLE CHEST 1 VIEW COMPARISON:  09/26/2019. FINDINGS: Endotracheal tube, NG tube in stable position. Cardiomegaly. Persistent bilateral interstitial prominence. Findings suggest CHF with interstitial edema. Pneumonitis cannot be excluded. Low lung volumes with bibasilar atelectasis. Small left pleural effusion cannot be excluded. No pneumothorax. Aortic, bilateral subclavian, bilateral carotid atherosclerotic vascular calcification. IMPRESSION: 1.  Endotracheal tube and NG tube in stable position. 2. Cardiomegaly persistent diffuse pulmonary interstitial prominence without significant change from prior exam. Findings suggest CHF. Small left pleural effusion cannot be excluded. 3.  Low lung volumes with bibasilar atelectasis. 4. Aortic, bilateral subclavian, bilateral carotid atherosclerotic vascular disease. Electronically Signed   By: Marcello Moores  Register   On: 09/27/2019 08:18   DG Chest Port 1 View  Result Date: 09/26/2019 CLINICAL DATA:  COVID-19 virus infection. Respiratory failure. Endotracheal intubation. EXAM: PORTABLE CHEST 1 VIEW COMPARISON:  09/25/2019 FINDINGS: Endotracheal tube and orogastric tube are in appropriate position. No pneumothorax visualized. Cardiomegaly stable. Aortic atherosclerosis incidentally noted. Elevation of left hemidiaphragm is again noted, with left basilar atelectasis. Heterogeneous bilateral airspace disease  shows mild worsening in central right upper lobe. No other new or worsening areas of airspace disease identified. IMPRESSION: 1. Bilateral airspace disease, with mild worsening in central right upper lobe. 2. Persistent elevation of left hemidiaphragm and left basilar atelectasis. 3. Endotracheal tube and orogastric tube in appropriate position. Electronically Signed   By: Marlaine Hind M.D.   On: 09/26/2019 09:55   DG Chest Port 1 View  Result Date: 09/25/2019 CLINICAL DATA:  Endotracheal tube and enteric tube placement. EXAM: PORTABLE CHEST 1 VIEW COMPARISON:  Earlier on the same date FINDINGS: Interval placement of an endotracheal 2, its tip projecting 6.5 cm above the carina. Interval placement of an enteric tube which traverses the left upper abdominal quadrant in the expected location of the gastric lumen, its tip not imaged. Stable moderate cardiomegaly with vascular congestion, moderate  interstitial edema, and diffuse interstitial and patchy airspace opacities in both lungs. Thoracic aorta and bilateral subclavian and axillary coarse atherosclerotic calcifications. Telemetry leads. Probable trace left pleural effusion. No pneumothorax. IMPRESSION: Interval placement of an endotracheal tube and enteric tube; a 3 cm advancement of the endotracheal tube could be considered. Stable moderate cardiomegaly with pulmonary edema, trace left pleural effusion and diffuse pulmonary disease which could be secondary to edema or infectious. Aorta and bilateral subclavian and axillary coarse atherosclerotic calcifications. Electronically Signed   By: Revonda Humphrey   On: 09/25/2019 23:34   DG Chest Port 1 View  Result Date: 09/25/2019 CLINICAL DATA:  59 year old male with cough and weakness. EXAM: PORTABLE CHEST 1 VIEW COMPARISON:  Chest radiograph dated 09/23/2019. FINDINGS: There is diffuse interstitial coarsening and mild chronic bronchitic changes. Faint bilateral mid lung field densities concerning for developing  infiltrate. Clinical correlation is recommended. No large pleural effusion or pneumothorax. There is stable cardiomegaly. Atherosclerotic calcification of the aorta. No acute osseous pathology. IMPRESSION: Bilateral mid lung field densities concerning for developing infiltrate. Clinical correlation and follow-up recommended. Electronically Signed   By: Anner Crete M.D.   On: 09/25/2019 21:22   DG Chest Port 1 View  Result Date: 09/23/2019 CLINICAL DATA:  Shortness of breath and abdominal distention. EXAM: PORTABLE CHEST 1 VIEW COMPARISON:  August 10, 2019 FINDINGS: Mild chronic appearing increased interstitial lung markings are again seen. Very mild hazy infiltrates are also seen along the lateral aspect of the mid lung fields, right greater than left. This represents a new finding when compared to the prior study. There is no evidence of a pleural effusion or pneumothorax. There is marked severity cardiomegaly which is unchanged in size. Marked severity calcification of the aortic arch is seen. The visualized skeletal structures are unremarkable. IMPRESSION: 1. Chronic appearing increased interstitial lung markings with mild hazy infiltrates seen along the lateral aspects of the mid lung fields, right greater than left. Electronically Signed   By: Virgina Norfolk M.D.   On: 09/23/2019 22:04   DG Abd Portable 1V  Result Date: 09/30/2019 CLINICAL DATA:  Abdominal pain. EXAM: PORTABLE ABDOMEN - 1 VIEW COMPARISON:  None. FINDINGS: Decubitus films are identified. No free air identified. Air-filled loops of large and small bowel are normal in caliber with no evidence of obstruction. IMPRESSION: No evidence of free air.  No evidence of obstruction identified. Electronically Signed   By: Dorise Bullion III M.D   On: 09/30/2019 08:45   ECHOCARDIOGRAM COMPLETE  Result Date: 10/04/2019    ECHOCARDIOGRAM REPORT   Patient Name:   [name] Date of Exam: 10/04/2019 Medical Rec #:  [id number]               Height:       69.0 in Accession #:    [id number]             Weight:       146.2 lb Date of Birth:  [birth year]               BSA:          1.808 m Patient Age:    9 years               BP:           125/70 mmHg Patient Gender: M                      HR:  71 bpm. Exam Location:  Inpatient Procedure: 2D Echo Indications:    Dyspnea 786.09 / R06.00  History:        Patient has prior history of Echocardiogram examinations, most                 recent 08/30/2017. COPD, Arrythmias:Atrial Flutter and LBBB,                 Signs/Symptoms:Shortness of Breath; Risk Factors:Current Smoker                 and Hypertension. Alcohol abuse                 Fluid overload                 ESRD.  Sonographer:    Vikki Ports [place] Referring Phys: [id number] [place]  1. Low normal LV systolic function; severe LVH (myocardium with specked appearance; consider amyloid); mild to moderate AS (mean gradient 11 mmHg and AVA 1.3 cm2); mild AI; thickened MV with mild to moderate MS (mean gradient 6 mmHg and MVA 1.2-2 cm2; pt in atrial fibrillation at time of study); severe biatrial enlargement; mild RVE; severe RV dysfunction; severe TR with severe pulmonary hypertension.  2. Left ventricular ejection fraction, by estimation, is 50 to 55%. The left ventricle has low normal function. The left ventricle has no regional wall motion abnormalities. There is severe left ventricular hypertrophy. Left ventricular diastolic function could not be evaluated.  3. Right ventricular systolic function is severely reduced. The right ventricular size is mildly enlarged. There is severely elevated pulmonary artery systolic pressure.  4. Left atrial size was severely dilated.  5. Right atrial size was severely dilated.  6. Large pleural effusion in the left lateral region.  7. The mitral valve is normal in structure. Mild mitral valve regurgitation. Mild to moderate mitral stenosis.  8. Tricuspid valve regurgitation is severe.  9.  The aortic valve is normal in structure. Aortic valve regurgitation is mild. Mild to moderate aortic valve stenosis. 10. The inferior vena cava is dilated in size with <50% respiratory variability, suggesting right atrial pressure of 15 mmHg. FINDINGS  Left Ventricle: Left ventricular ejection fraction, by estimation, is 50 to 55%. The left ventricle has low normal function. The left ventricle has no regional wall motion abnormalities. The left ventricular internal cavity size was normal in size. There is severe left ventricular hypertrophy. Left ventricular diastolic function could not be evaluated due to atrial fibrillation. Left ventricular diastolic function could not be evaluated. Right Ventricle: The right ventricular size is mildly enlarged. Right ventricular systolic function is severely reduced. There is severely elevated pulmonary artery systolic pressure. The tricuspid regurgitant velocity is 4.64 m/s, and with an assumed right atrial pressure of 15 mmHg, the estimated right ventricular systolic pressure is 387.5 mmHg. Left Atrium: Left atrial size was severely dilated. Right Atrium: Right atrial size was severely dilated. Pericardium: A small pericardial effusion is present. Mitral Valve: There is moderate thickening of the mitral valve leaflet(s). Severe mitral annular calcification. Mild mitral valve regurgitation. Mild to moderate mitral valve stenosis. Tricuspid Valve: The tricuspid valve is normal in structure. Tricuspid valve regurgitation is severe. No evidence of tricuspid stenosis. Aortic Valve: The aortic valve is normal in structure. Aortic valve regurgitation is mild. Mild to moderate aortic stenosis is present. Aortic valve mean gradient measures 10.0 mmHg. Aortic valve peak gradient measures 16.6 mmHg. Aortic valve area, by VTI measures 1.28 cm. Pulmonic Valve:  The pulmonic valve was normal in structure. Pulmonic valve regurgitation is trivial. No evidence of pulmonic stenosis. Aorta: The  aortic root is normal in size and structure. Venous: The inferior vena cava is dilated in size with less than 50% respiratory variability, suggesting right atrial pressure of 15 mmHg. IAS/Shunts: No atrial level shunt detected by color flow Doppler. Additional Comments: Low normal LV systolic function; severe LVH (myocardium with specked appearance; consider amyloid); mild to moderate AS (mean gradient 11 mmHg and AVA 1.3 cm2); mild AI; thickened MV with mild to moderate MS (mean gradient 6 mmHg and  MVA 1.2-2 cm2; pt in atrial fibrillation at time of study); severe biatrial enlargement; mild RVE; severe RV dysfunction; severe TR with severe pulmonary hypertension. There is a large pleural effusion in the left lateral region.  LEFT VENTRICLE PLAX 2D LVIDd:         4.10 cm LVIDs:         2.70 cm LV PW:         1.60 cm LV IVS:        1.60 cm LVOT diam:     1.90 cm LV SV:         50 LV SV Index:   27 LVOT Area:     2.84 cm  LEFT ATRIUM              Index       RIGHT ATRIUM           Index LA diam:        5.10 cm  2.82 cm/m  RA Area:     39.60 cm LA Vol (A2C):   112.0 ml 61.94 ml/m RA Volume:   166.00 ml 91.80 ml/m LA Vol (A4C):   102.0 ml 56.41 ml/m LA Biplane Vol: 111.0 ml 61.39 ml/m  AORTIC VALVE AV Area (Vmax):    1.33 cm AV Area (Vmean):   1.20 cm AV Area (VTI):     1.28 cm AV Vmax:           204.00 cm/s AV Vmean:          146.500 cm/s AV VTI:            0.387 m AV Peak Grad:      16.6 mmHg AV Mean Grad:      10.0 mmHg LVOT Vmax:         95.80 cm/s LVOT Vmean:        61.800 cm/s LVOT VTI:          0.175 m LVOT/AV VTI ratio: 0.45  AORTA Ao Root diam: 3.10 cm TRICUSPID VALVE TR Peak grad:   86.1 mmHg TR Vmax:        464.00 cm/s  SHUNTS Systemic VTI:  0.18 m Systemic Diam: 1.90 cm Kirk Ruths MD Electronically signed by Kirk Ruths MD Signature Date/Time: 10/04/2019/4:14:14 PM    Final    VAS [place] LOWER EXTREMITY VENOUS (DVT)  Result Date: 10/05/2019  Lower Venous DVTStudy Indications: Covid+,  Elevated D-dimer.  Risk Factors: ESRD, Liver cirrhosis. Limitations: Shadowing from arteries. Comparison Study: No prior exam. Performing Technologist: Baldwin Crown ARDMS, RVT  Examination Guidelines: A complete evaluation includes B-mode imaging, spectral Doppler, color Doppler, and power Doppler as needed of all accessible portions of each vessel. Bilateral testing is considered an integral part of a complete examination. Limited examinations for reoccurring indications may be performed as noted. The reflux portion of the exam is performed with the patient in reverse Trendelenburg.  +---------+---------------+---------+-----------+----------+--------------+  RIGHT    CompressibilityPhasicitySpontaneityPropertiesThrombus Aging +---------+---------------+---------+-----------+----------+--------------+ CFV      Full           Yes      Yes                                 +---------+---------------+---------+-----------+----------+--------------+ SFJ      Full                                                        +---------+---------------+---------+-----------+----------+--------------+ FV Prox  Full                                                        +---------+---------------+---------+-----------+----------+--------------+ FV Mid   Full                                                        +---------+---------------+---------+-----------+----------+--------------+ FV DistalFull                                                        +---------+---------------+---------+-----------+----------+--------------+ PFV      Full                                                        +---------+---------------+---------+-----------+----------+--------------+ POP      Full           Yes      Yes                                 +---------+---------------+---------+-----------+----------+--------------+ PTV      Full                                                         +---------+---------------+---------+-----------+----------+--------------+ PERO     Full                                                        +---------+---------------+---------+-----------+----------+--------------+   +---------+---------------+---------+-----------+----------+--------------+ LEFT     CompressibilityPhasicitySpontaneityPropertiesThrombus Aging +---------+---------------+---------+-----------+----------+--------------+ CFV      Full           Yes      Yes                                 +---------+---------------+---------+-----------+----------+--------------+  SFJ      Full                                                        +---------+---------------+---------+-----------+----------+--------------+ FV Prox  Full                                                        +---------+---------------+---------+-----------+----------+--------------+ FV Mid   Full                                                        +---------+---------------+---------+-----------+----------+--------------+ FV DistalFull                                                        +---------+---------------+---------+-----------+----------+--------------+ PFV      Full                                                        +---------+---------------+---------+-----------+----------+--------------+ POP      Full           Yes      Yes                                 +---------+---------------+---------+-----------+----------+--------------+ PTV      Full                                                        +---------+---------------+---------+-----------+----------+--------------+ PERO     Full                                                        +---------+---------------+---------+-----------+----------+--------------+     Summary: BILATERAL: - No evidence of deep vein thrombosis seen in the lower extremities, bilaterally.   RIGHT: - No cystic structure found in the popliteal fossa.  LEFT: - No cystic structure found in the popliteal fossa.  *See table(s) above for measurements and observations. Electronically signed by [name] MD on 10/05/2019 at 4:51:21 PM.    Final    IR Paracentesis  Result Date: 10/05/2019 INDICATION: Hepatitis C cirrhosis, end-stage renal disease, and recurrent ascites. Request for therapeutic and diagnostic paracentesis. EXAM: ULTRASOUND GUIDED PARACENTESIS MEDICATIONS: 1% lidocaine 15 mL COMPLICATIONS: None immediate. PROCEDURE: Informed written consent was obtained from the patient after a  discussion of the risks, benefits and alternatives to treatment. A timeout was performed prior to the initiation of the procedure. Initial ultrasound scanning demonstrates a moderate amount of ascites within the left lower abdominal quadrant. The left lower abdomen was prepped and draped in the usual sterile fashion. 1% lidocaine was used for local anesthesia. Following this, a 19 gauge, 7-cm, Yueh catheter was introduced. An ultrasound image was saved for documentation purposes. The paracentesis was performed. The catheter was removed and a dressing was applied. The patient tolerated the procedure well without immediate post procedural complication. Patient received post-procedure intravenous albumin; see nursing notes for details. FINDINGS: A total of approximately 2.8 L of clear yellow fluid was removed. Samples were sent to the laboratory as requested by the clinical team. IMPRESSION: Successful ultrasound-guided paracentesis yielding 2.8 liters of peritoneal fluid. Read by: Gareth Eagle, PA-C Electronically Signed   By: Lucrezia Europe M.D.   On: 10/05/2019 12:03   IR Paracentesis  Result Date: 09/17/2019 INDICATION: Patient history of hepatitis C cirrhosis end-stage renal disease with recurrent ascites presents for therapeutic and diagnostic paracentesis EXAM: ULTRASOUND GUIDED THERAPEUTIC AND DIAGNOSTIC  PARACENTESIS MEDICATIONS: Lidocaine 1% 10 mL COMPLICATIONS: None immediate. PROCEDURE: Informed written consent was obtained from the patient after a discussion of the risks, benefits and alternatives to treatment. A timeout was performed prior to the initiation of the procedure. Initial ultrasound scanning demonstrates a moderate amount of ascites within the right lower abdominal quadrant. The right lower abdomen was prepped and draped in the usual sterile fashion. 1% lidocaine was used for local anesthesia. Following this, a 19 gauge, 7-cm, Yueh catheter was introduced. An ultrasound image was saved for documentation purposes. The paracentesis was performed. The catheter was removed and a dressing was applied. The patient tolerated the procedure well without immediate post procedural complication. FINDINGS: A total of approximately 2.8 L of straw-colored fluid was removed. Samples were sent to the laboratory as requested by the clinical team. IMPRESSION: Successful ultrasound-guided therapeutic and diagnostic paracentesis yielding 2.8 liters of peritoneal fluid. Read by Rushie Nyhan NP Electronically Signed   By: Corrie Mckusick D.O.   On: 09/17/2019 12:04

## 2019-10-07 LAB — CBC
Hemoglobin: 8.1 g/dL — ABNORMAL LOW (ref 13.0–17.0)
Platelets: 181 10*3/uL (ref 150–400)
WBC: 7.5 10*3/uL (ref 4.0–10.5)
nRBC: 0 % (ref 0.0–0.2)

## 2019-10-07 NOTE — Discharge Summary (Signed)
AMA  Patient at this time expresses desire to leave the Hospital immidiately, patient has been warned that this is not Medically advisable at this time, and can result in Medical complications like Death and Disability, patient understands and accepts the risks involved and assumes full responsibilty of this decision.   Lala Lund M.D on 10/07/2019 at 7:12 AM  [facility]  Time < 30 minutes  Last Note Below                                                                       PROGRESS NOTE                                                                                                                                                                                                             Patient Demographics:    [name], is a 59 y.o. male, DOB - Nov 05, 1960, PJK:[id number]  Outpatient Primary MD for the patient is [name], FNP   Admit date - 10/04/2019   LOS - 2  Chief Complaint  Patient presents with   Abdominal Pain       Brief Narrative: Patient is a 59 y.o. male with PMHx of ESRD on HD, liver cirrhosis with recurrent ascites requiring serial paracentesis, hepatitis C, PAF not on anticoagulation, HTN, chronic pain syndrome, CAD s/p PCI to RCA in 2018,  history of cocaine/marijuana abuse-who was admitted to this facility from 3/16-3/22 for acute hypoxic respiratory failure from volume overload due to missed dialysis and COVID-19 pneumonia-he was also found to have C. difficile colitis-he unfortunately signed out AMA on 3/22-presented to the ED on 3/25 with acute hypoxic respiratory failure and abdominal pain.  See below for further details.  Significant Events: 3/16-3/22: Admit to [facility] for hypoxia from fluid overload due to missed dialysis/COVID-19 and C. difficile colitis.  Patient signed out AMA. 3/25:  Presented to the ED with hypoxia and abdominal pain. 3/25>> TTE-EF 50-55%, severely  reduced RV systolic function, mild/moderate MS, mild/moderate AAS  COVID-19 medications: Steroids: 3/16>> 3/21 Remdesivir: 3/16>> 3/21  Antibiotics: Oral vancomycin: 3/26>>  Microbiology data: 3/25: Blood culture>> negative so far  DVT prophylaxis: SQ heparin  Procedures: None  Consults: None    Subjective:   Patient in bed, appears comfortable, denies any headache, no fever, no chest pain or pressure, no shortness of breath , no abdominal pain. No focal weakness.  Unfortunately this patient left AMA few days ago, he was threatening to leave AMA yesterday on 10/05/2019 but finally agreed to stay, this morning when the nephrologist came to take him for dialysis he refused dialysis and threatened to leave AMA again.  After I counseled him for a prolonged.  Of time he has agreed to stay in the hospital and undergo dialysis, I have explained to him clearly that his noncompliance with treatment and signing out AMA persistently may result in stroke, disability or even death.  Have also left a detailed message for his sister on the listed phone number on 10/06/2019 at 11:10 AM.   Assessment  & Plan :   Acute Hypoxic Resp Failure: Felt to be secondary to fluid overload in the setting of ESRD-not completing full treatment in the outpatient setting.  Underwent HD on 3/25-improved-and on room air this morning.  I doubt patient has active Covid pneumonia at this time.  Covid 19 pneumonia: Treated appropriately during last admission-CRP significantly elevated-could be from fluid overload/abdominal pain.  He is on room air this morning-and has improved post hemodialysis-suspect does not need any further treatment-continue to observe for now.  Fever: afebrile  O2 requirements:  SpO2: 96 % O2 Flow Rate (L/min): 2 L/min   COVID-19 Labs: Recent Labs    10/04/19 1400 10/05/19 0826 10/06/19 1159  DDIMER >20.00*  >20.00* 18.32*  FERRITIN 3,045* 1,851*  --   LDH 317*  --   --   CRP 16.0* 13.7* 9.3*       Component Value Date/Time   BNP 2,690.1 (H) 10/06/2019 1159    Recent Labs  Lab 10/04/19 1400  PROCALCITON 2.05    Lab Results  Component Value Date   SARSCOV2NAA POSITIVE (A) 09/25/2019   [place] NEGATIVE 08/02/2019   [place] NEGATIVE 07/29/2019   [place] NEGATIVE 07/17/2019     Prone/Incentive Spirometry: encouraged  incentive spirometry use 3-4/hour.  Elevated D-dimer: Negative bilateral lower extremity Dopplers-he is on room air this morning-therefore doubt PE.  Likely his D-dimer elevation is chronic and nonspecific.  Abdominal pain: Abdominal exam benign-pain has currently resolved.  Recent history of C. difficile colitis: Denies diarrhea-reviewed last discharge summary-was supposed to be on vancomycin x1 additional week.  Poor historian-I doubt compliance-resume vancomycin while he is in the hospital.  Have stopped PPI today.  ESRD on HD TTS: Nephrology following-defer to nephrology.  Hyperkalemia: Continues to have mild hyperkalemia-start Lokelma-defer HD to renal.  Anemia: Secondary to ESRD-defer Aranesp/iron to nephrology.  CAD s/p PCI to RCA in 2018: No anginal symptoms-supportive care  PAF: Rate controlled with Cardizem and metoprolol-suspect due to noncompliance-not a good long-term anticoagulation candidate.  COPD/cor pulmonale: Stable-continue bronchodilators.  TTE with significantly dilated RV and poor RV systolic function which I suspect chronic given his overall clinical stability today.  HTN: Controlled-continue Cardizem, metoprolol  QTC prolongation: Noted at admission-however patient has a wide QRS complex at baseline.  Monitor in telemetry.  Liver cirrhosis/hep C: Apart from ascites-appears well compensated.  RN pressure injury documentation: Pressure Injury 10/04/19 Coccyx Stage 2 -  Partial thickness loss of dermis presenting as a shallow  open injury with a red, pink wound bed without slough. (Active)  10/04/19 2100  Location: Coccyx  Location Orientation:   Staging: Stage 2 -  Partial thickness loss of dermis presenting as a shallow open injury with a red, pink wound bed without slough.  Wound Description (Comments):   Present on Admission: Yes    Continued and persistent noncompliance with treatment.  Missing HD, refusing HD, signing out AMA.  Has been clearly counseled by me on 10/06/2019 that this kind of behavior may result in disability, death, stroke etc.  Also detailed message left for patient's sister on 10/06/2019.  Will also involve palliative care for future goals of care in the light of continued noncompliance with treatment including HD.  Also discussed with nephrologist Dr. Joelyn Oms who agrees with the plan.  Clinically he has capacity to decide for himself.     Condition -  Guarded  Family Communication  : Left a voicemail for sister on 10/06/2019 at 11:10 AM.  Code Status : DNR  Diet :  Diet Order    None       Disposition Plan  :  SNF vs Home health when ready for discharge  Barriers to discharge: Improving hypoxia-persistent hyperkalemia-needs inpatient hemodialysis tomorrow.  Awaiting paracentesis to rule out SBP.  Antimicorbials  :    Anti-infectives (From admission, onward)   Start     Dose/Rate Route Frequency Ordered Stop   10/05/19 1215  vancomycin (VANCOCIN) 50 mg/mL oral solution 125 mg  Status:  Discontinued     125 mg Oral 4 times daily 10/05/19 1112 10/07/19 0014      Inpatient Medications  Scheduled Meds:  Continuous Infusions:  PRN Meds:.   Time Spent in minutes  35     See all Orders from today for further details   Lala Lund M.D on 10/07/2019 at 7:12 AM  To page go to [email or web address] - use universal password  [facility] -  Office  [phone]    Objective:   Vitals:   10/06/19 1730 10/06/19 1800 10/06/19 1830 10/06/19 1840  BP: (!) 100/48 (!)  98/50 (!) 100/50 122/61  Pulse: (!) 40 (!) 42 (!) 48 (!) 50  Resp: 18 18 18 18   Temp:    97.6 F (36.4 C)  TempSrc:    Axillary  SpO2:    96%  Weight:    66 kg  Height:        Wt Readings from Last 3 Encounters:  10/06/19 66 kg  10/01/19 66.3 kg  09/23/19 63.5 kg     Intake/Output Summary (Last 24 hours) at 10/07/2019 2423 Last data filed at 10/06/2019 1840 Gross per 24 hour  Intake 120 ml  Output 1500 ml  Net -1380 ml     Physical Exam  Awake Alert, No new F.N deficits, Normal affect [place].AT,PERRAL Supple Neck,No JVD, No cervical lymphadenopathy appriciated.  Symmetrical Chest wall movement, Good air movement bilaterally, CTAB RRR,No Gallops, Rubs or new Murmurs, No Parasternal Heave +ve B.Sounds, Abd Soft, No tenderness, No organomegaly appriciated, No rebound - guarding or rigidity. No Cyanosis, Clubbing or edema, No new Rash or bruise    Data Review:    CBC Recent Labs  Lab 10/01/19 0326 10/01/19 0926 10/04/19 1022 10/05/19 0448 10/06/19 1159  WBC 10.0 9.3 11.2* 7.8 7.5  HGB 9.2* 8.9* 8.4* 8.7* 8.1*  HCT 25.7* 24.2* 23.8* 24.7* 18.5*  PLT 143* 129* 152 177 181  MCV  77.9* 79.6* 82.1 81.8 91.6  MCH 27.9 29.3 29.0 28.8 40.1*  MCHC 35.8 36.8* 35.3 35.2 43.8*  RDW 14.6 14.6 16.5* 17.4* Not Measured  LYMPHSABS 1.1 1.0  --   --   --   MONOABS 0.7 0.7  --   --   --   EOSABS 0.0 0.0  --   --   --   BASOSABS 0.0 0.0  --   --   --     Chemistries  Recent Labs  Lab 09/30/19 0744 09/30/19 0744 10/01/19 0326 10/01/19 0926 10/04/19 1022 10/05/19 0448 10/06/19 1159  NA 132*   < > 133* 131* 135 136 136   138  K 4.1   < > 4.5 4.5 6.2* 5.4* 3.7   4.1  CL 93*   < > 93* 94* 93* 96* 94*   96*  CO2 23   < > 22 23 26 26 28   30   GLUCOSE 84   < > 70 77 74 99 111*   112*  BUN 39*   < > 49* 53* 36* 21* 29*   29*  CREATININE 5.81*   < > 6.73* 7.00* 6.84* 4.28* 6.31*   6.33*  CALCIUM 9.4   < > 9.1 9.0 9.4 8.8* 8.8*   9.0  MG 1.6*  --  1.9 2.0  --   --  1.7  AST   --   --  53* 49* 34 34 22  ALT  --   --  24 24 20 20 18   ALKPHOS  --   --  110 100 123 116 100  BILITOT  --   --  0.6 0.6 1.2 1.0 1.2   < > = values in this interval not displayed.   ------------------------------------------------------------------------------------------------------------------ Recent Labs    10/04/19 1400  TRIG 183*    Lab Results  Component Value Date   HGBA1C 4.7 (L) 09/26/2019   ------------------------------------------------------------------------------------------------------------------ No results for input(s): TSH, T4TOTAL, T3FREE, THYROIDAB in the last 72 hours.  Invalid input(s): FREET3 ------------------------------------------------------------------------------------------------------------------ Recent Labs    10/04/19 1400 10/05/19 0826  FERRITIN 3,045* 1,851*    Coagulation profile Recent Labs  Lab 10/04/19 1400  INR 1.4*    Recent Labs    10/05/19 0826 10/06/19 1159  DDIMER >20.00* 18.32*    Cardiac Enzymes No results for input(s): CKMB, TROPONINI, MYOGLOBIN in the last 168 hours.  Invalid input(s): CK ------------------------------------------------------------------------------------------------------------------    Component Value Date/Time   BNP 2,690.1 (H) 10/06/2019 1159    Micro Results Recent Results (from the past 240 hour(s))  Blood Culture (routine x 2)     Status: None (Preliminary result)   Collection Time: 10/04/19  2:00 PM   Specimen: BLOOD RIGHT ARM  Result Value Ref Range Status   Specimen Description BLOOD RIGHT ARM  Final   Special Requests   Final    BOTTLES DRAWN AEROBIC AND ANAEROBIC Blood Culture adequate volume   Culture   Final    NO GROWTH 2 DAYS Performed at [facility], [address], [place], [place] [address]    Report Status PENDING  Incomplete  Blood Culture (routine x 2)     Status: None (Preliminary result)   Collection Time: 10/04/19  2:15 PM   Specimen: BLOOD RIGHT  HAND  Result Value Ref Range Status   Specimen Description BLOOD RIGHT HAND  Final   Special Requests   Final    BOTTLES DRAWN AEROBIC AND ANAEROBIC Blood Culture adequate volume  Culture   Final    NO GROWTH 2 DAYS Performed at [facility], [place] [address], [place], [place] [address]    Report Status PENDING  Incomplete  Gram stain     Status: None   Collection Time: 10/05/19  3:23 AM   Specimen: Fluid  Result Value Ref Range Status   Specimen Description FLUID  Final   Special Requests NONE  Final   Gram Stain   Final    CYTOSPIN SMEAR WBC PRESENT,BOTH PMN AND MONONUCLEAR NO ORGANISMS SEEN Performed at [facility], [place] [address] [place], [place] [address]    Report Status 10/05/2019 FINAL  Final  Culture, body fluid-bottle     Status: None (Preliminary result)   Collection Time: 10/05/19  3:23 AM   Specimen: Fluid  Result Value Ref Range Status   Specimen Description FLUID  Final   Special Requests NONE  Final   Culture   Final    NO GROWTH 1 DAY Performed at [facility], [place] [address] [place], [place] [address]    Report Status PENDING  Incomplete    Radiology Reports DG Chest Portable 1 View  Result Date: 10/04/2019 CLINICAL DATA:  Hypoxia. Additional history provided: Patient from home via EMS with lower abdominal pain, had dialysis yesterday, reportedly COVID positive 316 EXAM: PORTABLE CHEST 1 VIEW COMPARISON:  Chest radiograph 09/30/2019 FINDINGS: Unchanged cardiomegaly. Aortic atherosclerosis. Chronic elevation of the left hemidiaphragm. Similar appearance of patchy interstitial opacities most notable within the bilateral mid lung fields and left lung base. No sizable pleural effusion or evidence of pneumothorax. No acute bony abnormality. IMPRESSION: Similar appearance of patchy interstitial opacities most notable within the bilateral mid lung fields and left lung base. Findings may reflect asymmetric edema or sequela of atypical/viral  infection. Unchanged cardiomegaly.  Aortic atherosclerosis. Electronically Signed   By: Kellie Simmering DO   On: 10/04/2019 11:33   DG Chest Port 1 View  Result Date: 09/30/2019 CLINICAL DATA:  Shortness of breath.  Abdominal distention. EXAM: PORTABLE CHEST 1 VIEW COMPARISON:  September 27, 2019 FINDINGS: Patchy interstitial and ground-glass opacities are seen in the lungs, right greater than left. Cardiomegaly. The hila and mediastinum are normal. No pneumothorax. No other acute abnormalities. IMPRESSION: Patchy interstitial and ground-glass opacities may represent asymmetric edema versus atypical infection. No other acute abnormalities. Electronically Signed   By: Dorise Bullion III M.D   On: 09/30/2019 08:43   DG Chest Port 1 View  Result Date: 09/27/2019 CLINICAL DATA:  Acute respiratory failure. EXAM: PORTABLE CHEST 1 VIEW COMPARISON:  09/26/2019. FINDINGS: Endotracheal tube, NG tube in stable position. Cardiomegaly. Persistent bilateral interstitial prominence. Findings suggest CHF with interstitial edema. Pneumonitis cannot be excluded. Low lung volumes with bibasilar atelectasis. Small left pleural effusion cannot be excluded. No pneumothorax. Aortic, bilateral subclavian, bilateral carotid atherosclerotic vascular calcification. IMPRESSION: 1.  Endotracheal tube and NG tube in stable position. 2. Cardiomegaly persistent diffuse pulmonary interstitial prominence without significant change from prior exam. Findings suggest CHF. Small left pleural effusion cannot be excluded. 3.  Low lung volumes with bibasilar atelectasis. 4. Aortic, bilateral subclavian, bilateral carotid atherosclerotic vascular disease. Electronically Signed   By: Marcello Moores  Register   On: 09/27/2019 08:18   DG Chest Port 1 View  Result Date: 09/26/2019 CLINICAL DATA:  COVID-19 virus infection. Respiratory failure. Endotracheal intubation. EXAM: PORTABLE CHEST 1 VIEW COMPARISON:  09/25/2019 FINDINGS: Endotracheal tube and orogastric  tube are in appropriate position. No pneumothorax visualized. Cardiomegaly stable.  Aortic atherosclerosis incidentally noted. Elevation of left hemidiaphragm is again noted, with left basilar atelectasis. Heterogeneous bilateral airspace disease shows mild worsening in central right upper lobe. No other new or worsening areas of airspace disease identified. IMPRESSION: 1. Bilateral airspace disease, with mild worsening in central right upper lobe. 2. Persistent elevation of left hemidiaphragm and left basilar atelectasis. 3. Endotracheal tube and orogastric tube in appropriate position. Electronically Signed   By: Marlaine Hind M.D.   On: 09/26/2019 09:55   DG Chest Port 1 View  Result Date: 09/25/2019 CLINICAL DATA:  Endotracheal tube and enteric tube placement. EXAM: PORTABLE CHEST 1 VIEW COMPARISON:  Earlier on the same date FINDINGS: Interval placement of an endotracheal 2, its tip projecting 6.5 cm above the carina. Interval placement of an enteric tube which traverses the left upper abdominal quadrant in the expected location of the gastric lumen, its tip not imaged. Stable moderate cardiomegaly with vascular congestion, moderate interstitial edema, and diffuse interstitial and patchy airspace opacities in both lungs. Thoracic aorta and bilateral subclavian and axillary coarse atherosclerotic calcifications. Telemetry leads. Probable trace left pleural effusion. No pneumothorax. IMPRESSION: Interval placement of an endotracheal tube and enteric tube; a 3 cm advancement of the endotracheal tube could be considered. Stable moderate cardiomegaly with pulmonary edema, trace left pleural effusion and diffuse pulmonary disease which could be secondary to edema or infectious. Aorta and bilateral subclavian and axillary coarse atherosclerotic calcifications. Electronically Signed   By: Revonda Humphrey   On: 09/25/2019 23:34   DG Chest Port 1 View  Result Date: 09/25/2019 CLINICAL DATA:  59 year old male with  cough and weakness. EXAM: PORTABLE CHEST 1 VIEW COMPARISON:  Chest radiograph dated 09/23/2019. FINDINGS: There is diffuse interstitial coarsening and mild chronic bronchitic changes. Faint bilateral mid lung field densities concerning for developing infiltrate. Clinical correlation is recommended. No large pleural effusion or pneumothorax. There is stable cardiomegaly. Atherosclerotic calcification of the aorta. No acute osseous pathology. IMPRESSION: Bilateral mid lung field densities concerning for developing infiltrate. Clinical correlation and follow-up recommended. Electronically Signed   By: Anner Crete M.D.   On: 09/25/2019 21:22   DG Chest Port 1 View  Result Date: 09/23/2019 CLINICAL DATA:  Shortness of breath and abdominal distention. EXAM: PORTABLE CHEST 1 VIEW COMPARISON:  August 10, 2019 FINDINGS: Mild chronic appearing increased interstitial lung markings are again seen. Very mild hazy infiltrates are also seen along the lateral aspect of the mid lung fields, right greater than left. This represents a new finding when compared to the prior study. There is no evidence of a pleural effusion or pneumothorax. There is marked severity cardiomegaly which is unchanged in size. Marked severity calcification of the aortic arch is seen. The visualized skeletal structures are unremarkable. IMPRESSION: 1. Chronic appearing increased interstitial lung markings with mild hazy infiltrates seen along the lateral aspects of the mid lung fields, right greater than left. Electronically Signed   By: Virgina Norfolk M.D.   On: 09/23/2019 22:04   DG Abd Portable 1V  Result Date: 09/30/2019 CLINICAL DATA:  Abdominal pain. EXAM: PORTABLE ABDOMEN - 1 VIEW COMPARISON:  None. FINDINGS: Decubitus films are identified. No free air identified. Air-filled loops of large and small bowel are normal in caliber with no evidence of obstruction. IMPRESSION: No evidence of free air.  No evidence of obstruction identified.  Electronically Signed   By: Dorise Bullion III M.D   On: 09/30/2019 08:45   ECHOCARDIOGRAM COMPLETE  Result Date: 10/04/2019    ECHOCARDIOGRAM REPORT  Patient Name:   SONY SCHLARB Date of Exam: 10/04/2019 Medical Rec #:  [id number]              Height:       69.0 in Accession #:    [id number]             Weight:       146.2 lb Date of Birth:  1961-07-06               BSA:          1.808 m Patient Age:    4 years               BP:           125/70 mmHg Patient Gender: M                      HR:           71 bpm. Exam Location:  Inpatient Procedure: 2D Echo Indications:    Dyspnea 786.09 / R06.00  History:        Patient has prior history of Echocardiogram examinations, most                 recent 08/30/2017. COPD, Arrythmias:Atrial Flutter and LBBB,                 Signs/Symptoms:Shortness of Breath; Risk Factors:Current Smoker                 and Hypertension. Alcohol abuse                 Fluid overload                 ESRD.  Sonographer:    Vikki Ports Turrentine Referring Phys: [id number] [place]  1. Low normal LV systolic function; severe LVH (myocardium with specked appearance; consider amyloid); mild to moderate AS (mean gradient 11 mmHg and AVA 1.3 cm2); mild AI; thickened MV with mild to moderate MS (mean gradient 6 mmHg and MVA 1.2-2 cm2; pt in atrial fibrillation at time of study); severe biatrial enlargement; mild RVE; severe RV dysfunction; severe TR with severe pulmonary hypertension.  2. Left ventricular ejection fraction, by estimation, is 50 to 55%. The left ventricle has low normal function. The left ventricle has no regional wall motion abnormalities. There is severe left ventricular hypertrophy. Left ventricular diastolic function could not be evaluated.  3. Right ventricular systolic function is severely reduced. The right ventricular size is mildly enlarged. There is severely elevated pulmonary artery systolic pressure.  4. Left atrial size was severely dilated.  5. Right  atrial size was severely dilated.  6. Large pleural effusion in the left lateral region.  7. The mitral valve is normal in structure. Mild mitral valve regurgitation. Mild to moderate mitral stenosis.  8. Tricuspid valve regurgitation is severe.  9. The aortic valve is normal in structure. Aortic valve regurgitation is mild. Mild to moderate aortic valve stenosis. 10. The inferior vena cava is dilated in size with <50% respiratory variability, suggesting right atrial pressure of 15 mmHg. FINDINGS  Left Ventricle: Left ventricular ejection fraction, by estimation, is 50 to 55%. The left ventricle has low normal function. The left ventricle has no regional wall motion abnormalities. The left ventricular internal cavity size was normal in size. There is severe left ventricular hypertrophy. Left ventricular diastolic function could not be evaluated due to atrial fibrillation. Left ventricular diastolic function could  not be evaluated. Right Ventricle: The right ventricular size is mildly enlarged. Right ventricular systolic function is severely reduced. There is severely elevated pulmonary artery systolic pressure. The tricuspid regurgitant velocity is 4.64 m/s, and with an assumed right atrial pressure of 15 mmHg, the estimated right ventricular systolic pressure is 086.7 mmHg. Left Atrium: Left atrial size was severely dilated. Right Atrium: Right atrial size was severely dilated. Pericardium: A small pericardial effusion is present. Mitral Valve: There is moderate thickening of the mitral valve leaflet(s). Severe mitral annular calcification. Mild mitral valve regurgitation. Mild to moderate mitral valve stenosis. Tricuspid Valve: The tricuspid valve is normal in structure. Tricuspid valve regurgitation is severe. No evidence of tricuspid stenosis. Aortic Valve: The aortic valve is normal in structure. Aortic valve regurgitation is mild. Mild to moderate aortic stenosis is present. Aortic valve mean gradient measures  10.0 mmHg. Aortic valve peak gradient measures 16.6 mmHg. Aortic valve area, by VTI measures 1.28 cm. Pulmonic Valve: The pulmonic valve was normal in structure. Pulmonic valve regurgitation is trivial. No evidence of pulmonic stenosis. Aorta: The aortic root is normal in size and structure. Venous: The inferior vena cava is dilated in size with less than 50% respiratory variability, suggesting right atrial pressure of 15 mmHg. IAS/Shunts: No atrial level shunt detected by color flow Doppler. Additional Comments: Low normal LV systolic function; severe LVH (myocardium with specked appearance; consider amyloid); mild to moderate AS (mean gradient 11 mmHg and AVA 1.3 cm2); mild AI; thickened MV with mild to moderate MS (mean gradient 6 mmHg and  MVA 1.2-2 cm2; pt in atrial fibrillation at time of study); severe biatrial enlargement; mild RVE; severe RV dysfunction; severe TR with severe pulmonary hypertension. There is a large pleural effusion in the left lateral region.  LEFT VENTRICLE PLAX 2D LVIDd:         4.10 cm LVIDs:         2.70 cm LV PW:         1.60 cm LV IVS:        1.60 cm LVOT diam:     1.90 cm LV SV:         50 LV SV Index:   27 LVOT Area:     2.84 cm  LEFT ATRIUM              Index       RIGHT ATRIUM           Index LA diam:        5.10 cm  2.82 cm/m  RA Area:     39.60 cm LA Vol (A2C):   112.0 ml 61.94 ml/m RA Volume:   166.00 ml 91.80 ml/m LA Vol (A4C):   102.0 ml 56.41 ml/m LA Biplane Vol: 111.0 ml 61.39 ml/m  AORTIC VALVE AV Area (Vmax):    1.33 cm AV Area (Vmean):   1.20 cm AV Area (VTI):     1.28 cm AV Vmax:           204.00 cm/s AV Vmean:          146.500 cm/s AV VTI:            0.387 m AV Peak Grad:      16.6 mmHg AV Mean Grad:      10.0 mmHg LVOT Vmax:         95.80 cm/s LVOT Vmean:        61.800 cm/s LVOT VTI:  0.175 m LVOT/AV VTI ratio: 0.45  AORTA Ao Root diam: 3.10 cm TRICUSPID VALVE TR Peak grad:   86.1 mmHg TR Vmax:        464.00 cm/s  SHUNTS Systemic VTI:  0.18 m  Systemic Diam: 1.90 cm Kirk Ruths MD Electronically signed by Kirk Ruths MD Signature Date/Time: 10/04/2019/4:14:14 PM    Final    VAS [place] LOWER EXTREMITY VENOUS (DVT)  Result Date: 10/05/2019  Lower Venous DVTStudy Indications: Covid+, Elevated D-dimer.  Risk Factors: ESRD, Liver cirrhosis. Limitations: Shadowing from arteries. Comparison Study: No prior exam. Performing Technologist: Baldwin Crown ARDMS, RVT  Examination Guidelines: A complete evaluation includes B-mode imaging, spectral Doppler, color Doppler, and power Doppler as needed of all accessible portions of each vessel. Bilateral testing is considered an integral part of a complete examination. Limited examinations for reoccurring indications may be performed as noted. The reflux portion of the exam is performed with the patient in reverse Trendelenburg.  +---------+---------------+---------+-----------+----------+--------------+  RIGHT     Compressibility Phasicity Spontaneity Properties Thrombus Aging  +---------+---------------+---------+-----------+----------+--------------+  CFV       Full            Yes       Yes                                    +---------+---------------+---------+-----------+----------+--------------+  SFJ       Full                                                             +---------+---------------+---------+-----------+----------+--------------+  FV Prox   Full                                                             +---------+---------------+---------+-----------+----------+--------------+  FV Mid    Full                                                             +---------+---------------+---------+-----------+----------+--------------+  FV Distal Full                                                             +---------+---------------+---------+-----------+----------+--------------+  PFV       Full                                                              +---------+---------------+---------+-----------+----------+--------------+  POP       Full  Yes       Yes                                    +---------+---------------+---------+-----------+----------+--------------+  PTV       Full                                                             +---------+---------------+---------+-----------+----------+--------------+  PERO      Full                                                             +---------+---------------+---------+-----------+----------+--------------+   +---------+---------------+---------+-----------+----------+--------------+  LEFT      Compressibility Phasicity Spontaneity Properties Thrombus Aging  +---------+---------------+---------+-----------+----------+--------------+  CFV       Full            Yes       Yes                                    +---------+---------------+---------+-----------+----------+--------------+  SFJ       Full                                                             +---------+---------------+---------+-----------+----------+--------------+  FV Prox   Full                                                             +---------+---------------+---------+-----------+----------+--------------+  FV Mid    Full                                                             +---------+---------------+---------+-----------+----------+--------------+  FV Distal Full                                                             +---------+---------------+---------+-----------+----------+--------------+  PFV       Full                                                             +---------+---------------+---------+-----------+----------+--------------+  POP       Full            Yes       Yes                                    +---------+---------------+---------+-----------+----------+--------------+  PTV       Full                                                              +---------+---------------+---------+-----------+----------+--------------+  PERO      Full                                                             +---------+---------------+---------+-----------+----------+--------------+     Summary: BILATERAL: - No evidence of deep vein thrombosis seen in the lower extremities, bilaterally.  RIGHT: - No cystic structure found in the popliteal fossa.  LEFT: - No cystic structure found in the popliteal fossa.  *See table(s) above for measurements and observations. Electronically signed by [name] MD on 10/05/2019 at 4:51:21 PM.    Final    IR Paracentesis  Result Date: 10/05/2019 INDICATION: Hepatitis C cirrhosis, end-stage renal disease, and recurrent ascites. Request for therapeutic and diagnostic paracentesis. EXAM: ULTRASOUND GUIDED PARACENTESIS MEDICATIONS: 1% lidocaine 15 mL COMPLICATIONS: None immediate. PROCEDURE: Informed written consent was obtained from the patient after a discussion of the risks, benefits and alternatives to treatment. A timeout was performed prior to the initiation of the procedure. Initial ultrasound scanning demonstrates a moderate amount of ascites within the left lower abdominal quadrant. The left lower abdomen was prepped and draped in the usual sterile fashion. 1% lidocaine was used for local anesthesia. Following this, a 19 gauge, 7-cm, Yueh catheter was introduced. An ultrasound image was saved for documentation purposes. The paracentesis was performed. The catheter was removed and a dressing was applied. The patient tolerated the procedure well without immediate post procedural complication. Patient received post-procedure intravenous albumin; see nursing notes for details. FINDINGS: A total of approximately 2.8 L of clear yellow fluid was removed. Samples were sent to the laboratory as requested by the clinical team. IMPRESSION: Successful ultrasound-guided paracentesis yielding 2.8 liters of peritoneal fluid. Read by:  Gareth Eagle, PA-C Electronically Signed   By: Lucrezia Europe M.D.   On: 10/05/2019 12:03   IR Paracentesis  Result Date: 09/17/2019 INDICATION: Patient history of hepatitis C cirrhosis end-stage renal disease with recurrent ascites presents for therapeutic and diagnostic paracentesis EXAM: ULTRASOUND GUIDED THERAPEUTIC AND DIAGNOSTIC PARACENTESIS MEDICATIONS: Lidocaine 1% 10 mL COMPLICATIONS: None immediate. PROCEDURE: Informed written consent was obtained from the patient after a discussion of the risks, benefits and alternatives to treatment. A timeout was performed prior to the initiation of the procedure. Initial ultrasound scanning demonstrates a moderate amount of ascites within the right lower abdominal quadrant. The right lower abdomen was prepped and draped in the usual sterile fashion. 1% lidocaine was used for local anesthesia. Following this, a 19 gauge, 7-cm, Yueh catheter was introduced. An ultrasound image was saved  for documentation purposes. The paracentesis was performed. The catheter was removed and a dressing was applied. The patient tolerated the procedure well without immediate post procedural complication. FINDINGS: A total of approximately 2.8 L of straw-colored fluid was removed. Samples were sent to the laboratory as requested by the clinical team. IMPRESSION: Successful ultrasound-guided therapeutic and diagnostic paracentesis yielding 2.8 liters of peritoneal fluid. Read by Rushie Nyhan NP Electronically Signed   By: Corrie Mckusick D.O.   On: 09/17/2019 12:04

## 2019-10-08 LAB — PATHOLOGIST SMEAR REVIEW

## 2019-10-09 LAB — CULTURE, BLOOD (ROUTINE X 2)
Culture: NO GROWTH
Culture: NO GROWTH
Special Requests: ADEQUATE
Special Requests: ADEQUATE

## 2019-10-10 LAB — CULTURE, BODY FLUID W GRAM STAIN -BOTTLE: Culture: NO GROWTH

## 2019-10-16 ENCOUNTER — Emergency Department (HOSPITAL_COMMUNITY)
Admission: EM | Admit: 2019-10-16 | Discharge: 2019-10-16 | Disposition: A | Discharge status: Home / Self Care | Payer: Medicare Other | Source: Home / Self Care | Attending: Emergency Medicine | Admitting: Emergency Medicine

## 2019-10-16 ENCOUNTER — Emergency Department (HOSPITAL_COMMUNITY)
Admission: EM | Admit: 2019-10-16 | Discharge: 2019-10-16 | Disposition: A | Discharge status: Home / Self Care | Payer: Medicare Other | Attending: Emergency Medicine | Admitting: Emergency Medicine

## 2019-10-16 ENCOUNTER — Other Ambulatory Visit: Payer: Self-pay

## 2019-10-16 DIAGNOSIS — L89152 Pressure ulcer of sacral region, stage 2: Secondary | ICD-10-CM

## 2019-10-16 DIAGNOSIS — Z79899 Other long term (current) drug therapy: Secondary | ICD-10-CM | POA: Insufficient documentation

## 2019-10-16 DIAGNOSIS — R0602 Shortness of breath: Secondary | ICD-10-CM | POA: Diagnosis present

## 2019-10-16 DIAGNOSIS — F1210 Cannabis abuse, uncomplicated: Secondary | ICD-10-CM | POA: Insufficient documentation

## 2019-10-16 DIAGNOSIS — Z992 Dependence on renal dialysis: Secondary | ICD-10-CM | POA: Insufficient documentation

## 2019-10-16 DIAGNOSIS — F17210 Nicotine dependence, cigarettes, uncomplicated: Secondary | ICD-10-CM | POA: Insufficient documentation

## 2019-10-16 DIAGNOSIS — J449 Chronic obstructive pulmonary disease, unspecified: Secondary | ICD-10-CM | POA: Insufficient documentation

## 2019-10-16 DIAGNOSIS — R600 Localized edema: Secondary | ICD-10-CM | POA: Insufficient documentation

## 2019-10-16 DIAGNOSIS — N186 End stage renal disease: Secondary | ICD-10-CM | POA: Insufficient documentation

## 2019-10-16 DIAGNOSIS — R188 Other ascites: Secondary | ICD-10-CM | POA: Diagnosis not present

## 2019-10-16 DIAGNOSIS — I4891 Unspecified atrial fibrillation: Secondary | ICD-10-CM | POA: Insufficient documentation

## 2019-10-16 DIAGNOSIS — I120 Hypertensive chronic kidney disease with stage 5 chronic kidney disease or end stage renal disease: Secondary | ICD-10-CM | POA: Insufficient documentation

## 2019-10-16 LAB — BASIC METABOLIC PANEL
Anion gap: 12 (ref 5–15)
BUN: 20 mg/dL (ref 6–20)
CO2: 31 mmol/L (ref 22–32)
Calcium: 9.3 mg/dL (ref 8.9–10.3)
Chloride: 94 mmol/L — ABNORMAL LOW (ref 98–111)
Creatinine, Ser: 4.42 mg/dL — ABNORMAL HIGH (ref 0.61–1.24)
GFR calc Af Amer: 16 mL/min — ABNORMAL LOW (ref >60)
GFR calc non Af Amer: 14 mL/min — ABNORMAL LOW (ref >60)
Glucose, Bld: 89 mg/dL (ref 70–99)
Potassium: 4.8 mmol/L (ref 3.5–5.1)
Sodium: 137 mmol/L (ref 135–145)

## 2019-10-16 MED ORDER — OXYCODONE HCL ER 10 MG PO T12A
10.0000 mg | EXTENDED_RELEASE_TABLET | Freq: Two times a day (BID) | ORAL | Status: DC
  Administered 2019-10-16: 22:00:00 10 mg via ORAL
  Filled 2019-10-16: qty 1

## 2019-10-16 NOTE — ED Provider Notes (Signed)
[facility] Provider Note   CSN: [id number] Arrival date & time: 10/16/19  1846     History Chief Complaint  Patient presents with  . Wound Check    [name] is a 59 y.o. male.  HPI   Patient presents for the second time today, 34th time in 6 months, now with concern for pain and a sacral decubitus ulcer. He denies dyspnea, denies fever, denies complaints since he was seen approximately 9 hours ago by a colleague in this facility. He states that he has been trying to do wound care at home for his decubitus ulcer, but had not previously discussed this with clinicians.  He has not seen physicians in the office either. He denies obvious drainage, bleeding, but as the location of the wound is not ideal, he is not sure of these conditions.  Pain is severe, not improved with home Percocet use.  Past Medical History:  Diagnosis Date  . Anemia   . Anxiety   . Arthritis    left shoulder  . Atherosclerosis of aorta ([place])   . Cardiomegaly   . Chest pain    DATE UNKNOWN, C/O PERIODICALLY  . Cocaine abuse ([place])   . COPD exacerbation ([place]) 08/17/2016  . Coronary artery disease    stent 02/22/17  . ESRD (end stage renal disease) on dialysis ([place])    "[name]; MWF" (07/04/2017)  . GERD (gastroesophageal reflux disease)    DATE UNKNOWN  . Hemorrhoids   . Hepatitis B, chronic ([place])   . Hepatitis C   . History of kidney stones   . Hyperkalemia   . Hypertension   . Kidney failure   . Metabolic bone disease    Patient denies  . Mitral stenosis   . Myocardial infarction ([place])   . Pneumonia   . Pulmonary edema   . Solitary rectal ulcer syndrome 07/2017   at [facility] for rectal bleeding  . Tubular adenoma of colon     Patient Active Problem List   Diagnosis Date Noted  . Hypoxia 10/04/2019  . Advanced care planning/counseling discussion   . Renal failure 09/26/2019  . Dyspnea 06/09/2019  . Acquired thrombophilia ([place])  06/05/2019  . A-fib ([place]) 05/30/2019  . Atrial fibrillation with RVR ([place]) 05/29/2019  . Melena   . Pressure injury of skin 03/09/2019  . Abdominal distention   . Volume overload 12/28/2018  . Sepsis ([place]) 09/12/2018  . Atherosclerosis of native coronary artery of native heart without angina pectoris 03/11/2018  . Benign neoplasm of cecum   . Benign neoplasm of ascending colon   . Benign neoplasm of descending colon   . Benign neoplasm of rectum   . Paroxysmal atrial fibrillation ([place]) 01/23/2018  . Hx of colonic polyps 01/20/2018  . End-stage renal disease on hemodialysis ([place]) 11/21/2017  . GERD (gastroesophageal reflux disease) 11/16/2017  . Decompensated hepatic cirrhosis ([place]) 11/15/2017  . DNR (do not resuscitate)   . Palliative care by specialist   . Hyponatremia 11/04/2017  . SBP (spontaneous bacterial peritonitis) ([place]) 10/30/2017  . Liver disease, chronic 10/30/2017  . SOB (shortness of breath)   . Abdominal pain 10/28/2017  . Upper airway cough syndrome with flattening on f/v loop 10/13/17 c/w vcd 10/17/2017  . Elevated diaphragm 10/13/2017  . Ileus ([place]) 09/29/2017  . QT prolongation 09/29/2017  . Malnutrition of moderate degree 09/29/2017  . Sinus congestion 09/03/2017  . Symptomatic anemia 09/02/2017  . Other cirrhosis of liver ([place]) 09/02/2017  .  Left bundle branch block 09/02/2017  . Mitral stenosis 09/02/2017  . Hematochezia 07/15/2017  . Wide-complex tachycardia ([place])   . Endotracheally intubated   . ESRD on dialysis ([place]) 07/04/2017  . Acute respiratory failure with hypoxia ([place]) 06/18/2017  . CKD (chronic kidney disease) stage V requiring chronic dialysis ([place]) 06/18/2017  . History of Cocaine abuse ([place]) 06/18/2017  . Hypertension 06/18/2017  . Infection of AV graft for dialysis ([place]) 06/18/2017  . Anxiety 06/18/2017  . Anemia due to chronic kidney disease 06/18/2017  . Atypical atrial flutter ([place]) 06/18/2017  . Personality disorder ([place]) 06/13/2017    . Cellulitis 06/12/2017  . Adjustment disorder with mixed anxiety and depressed mood 06/10/2017  . Suicidal ideation 06/10/2017  . Arm wound, left, sequela 06/10/2017  . Dyspnea on exertion 05/29/2017  . Tachycardia 05/29/2017  . Hyperkalemia 05/22/2017  . Acute metabolic encephalopathy   . Anemia 04/23/2017  . Ascites 04/23/2017  . COPD (chronic obstructive pulmonary disease) ([place]) 04/23/2017  . Acute on chronic respiratory failure with hypoxia ([place]) 03/25/2017  . Arrhythmia 03/25/2017  . COPD GOLD 0 with flattening on inps f/v  09/27/2016  . Essential hypertension 09/27/2016  . Fluid overload 08/30/2016  . COPD exacerbation ([place]) 08/17/2016  . Hypertensive urgency 08/17/2016  . Respiratory failure ([place]) 08/17/2016  . Problem with dialysis access ([place]) 07/23/2016  . Chronic hepatitis B ([place]) 03/05/2014  . Chronic hepatitis C without hepatic coma ([place]) 03/05/2014  . Internal hemorrhoids with bleeding, swelling and itching 03/05/2014  . Thrombocytopenia ([place]) 03/05/2014  . Chest pain 02/27/2014  . Alcohol abuse 04/14/2009  . Cigarette smoker 04/14/2009  . GANGLION CYST 04/14/2009    Past Surgical History:  Procedure Laterality Date  . A/V FISTULAGRAM Left 05/26/2017   Procedure: A/V FISTULAGRAM;  Surgeon: Conrad Red Level, MD;  Location: [facility];  Service: Cardiovascular;  Laterality: Left;  . A/V FISTULAGRAM Right 11/18/2017   Procedure: A/V FISTULAGRAM - Right Arm;  Surgeon: Elam Dutch, MD;  Location: [facility];  Service: Cardiovascular;  Laterality: Right;  . APPLICATION OF WOUND VAC Left 06/14/2017   Procedure: APPLICATION OF WOUND VAC;  Surgeon: Katha Cabal, MD;  Location: [facility];  Service: Vascular;  Laterality: Left;  . AV FISTULA PLACEMENT  2012   BELIEVED WAS PLACED IN JUNE  . AV FISTULA PLACEMENT Right 08/09/2017   Procedure: Creation Right arm ARTERIOVENOUS BRACHIOCEPOHALIC FISTULA;  Surgeon: Elam Dutch, MD;  Location: [facility] OR;   Service: Vascular;  Laterality: Right;  . AV FISTULA PLACEMENT Right 11/22/2017   Procedure: INSERTION OF ARTERIOVENOUS (AV) GORE-TEX GRAFT RIGHT UPPER ARM;  Surgeon: Elam Dutch, MD;  Location: [facility];  Service: Vascular;  Laterality: Right;  . BIOPSY  01/25/2018   Procedure: BIOPSY;  Surgeon: Jerene Bears, MD;  Location: [facility];  Service: Gastroenterology;;  . BIOPSY  04/10/2019   Procedure: BIOPSY;  Surgeon: Jerene Bears, MD;  Location: [facility];  Service: Gastroenterology;;  . COLONOSCOPY    . COLONOSCOPY WITH PROPOFOL N/A 01/25/2018   Procedure: COLONOSCOPY WITH PROPOFOL;  Surgeon: Jerene Bears, MD;  Location: [facility];  Service: Gastroenterology;  Laterality: N/A;  . CORONARY STENT INTERVENTION N/A 02/22/2017   Procedure: CORONARY STENT INTERVENTION;  Surgeon: Nigel Mormon, MD;  Location: [facility];  Service: Cardiovascular;  Laterality: N/A;  . ESOPHAGOGASTRODUODENOSCOPY (EGD) WITH PROPOFOL N/A 01/25/2018   Procedure: ESOPHAGOGASTRODUODENOSCOPY (EGD) WITH PROPOFOL;  Surgeon: Jerene Bears, MD;  Location: [facility];  Service: Gastroenterology;  Laterality: N/A;  . ESOPHAGOGASTRODUODENOSCOPY (EGD) WITH PROPOFOL N/A 04/10/2019   Procedure: ESOPHAGOGASTRODUODENOSCOPY (EGD) WITH PROPOFOL;  Surgeon: Jerene Bears, MD;  Location: [facility];  Service: Gastroenterology;  Laterality: N/A;  . FLEXIBLE SIGMOIDOSCOPY N/A 07/15/2017   Procedure: FLEXIBLE SIGMOIDOSCOPY;  Surgeon: Carol Ada, MD;  Location: [facility];  Service: Endoscopy;  Laterality: N/A;  . HEMORRHOID BANDING    . I & D EXTREMITY Left 06/01/2017   Procedure: IRRIGATION AND DEBRIDEMENT LEFT ARM HEMATOMA WITH LIGATION OF LEFT ARM AV FISTULA;  Surgeon: Elam Dutch, MD;  Location: [facility];  Service: Vascular;  Laterality: Left;  . I & D EXTREMITY Left 06/14/2017   Procedure: IRRIGATION AND DEBRIDEMENT EXTREMITY;  Surgeon: Katha Cabal, MD;  Location: [facility];  Service: Vascular;   Laterality: Left;  . INSERTION OF DIALYSIS CATHETER  05/30/2017  . INSERTION OF DIALYSIS CATHETER N/A 05/30/2017   Procedure: INSERTION OF DIALYSIS CATHETER;  Surgeon: Elam Dutch, MD;  Location: [facility];  Service: Vascular;  Laterality: N/A;  . IR PARACENTESIS  08/30/2017  . IR PARACENTESIS  09/29/2017  . IR PARACENTESIS  10/28/2017  . IR PARACENTESIS  11/09/2017  . IR PARACENTESIS  11/16/2017  . IR PARACENTESIS  11/28/2017  . IR PARACENTESIS  12/01/2017  . IR PARACENTESIS  12/06/2017  . IR PARACENTESIS  01/03/2018  . IR PARACENTESIS  01/23/2018  . IR PARACENTESIS  02/07/2018  . IR PARACENTESIS  02/21/2018  . IR PARACENTESIS  03/06/2018  . IR PARACENTESIS  03/17/2018  . IR PARACENTESIS  04/04/2018  . IR PARACENTESIS  12/28/2018  . IR PARACENTESIS  01/08/2019  . IR PARACENTESIS  01/23/2019  . IR PARACENTESIS  02/01/2019  . IR PARACENTESIS  02/19/2019  . IR PARACENTESIS  03/01/2019  . IR PARACENTESIS  03/15/2019  . IR PARACENTESIS  04/03/2019  . IR PARACENTESIS  04/12/2019  . IR PARACENTESIS  05/01/2019  . IR PARACENTESIS  05/08/2019  . IR PARACENTESIS  05/24/2019  . IR PARACENTESIS  06/12/2019  . IR PARACENTESIS  07/09/2019  . IR PARACENTESIS  07/27/2019  . IR PARACENTESIS  08/09/2019  . IR PARACENTESIS  08/21/2019  . IR PARACENTESIS  09/17/2019  . IR PARACENTESIS  10/05/2019  . IR RADIOLOGIST EVAL & MGMT  02/14/2018  . IR RADIOLOGIST EVAL & MGMT  02/22/2019  . LEFT HEART CATH AND CORONARY ANGIOGRAPHY N/A 02/22/2017   Procedure: LEFT HEART CATH AND CORONARY ANGIOGRAPHY;  Surgeon: Nigel Mormon, MD;  Location: [facility];  Service: Cardiovascular;  Laterality: N/A;  . LIGATION OF ARTERIOVENOUS  FISTULA Left 12/13/8754   Procedure: Plication of Left Arm Arteriovenous Fistula;  Surgeon: Elam Dutch, MD;  Location: [facility];  Service: Vascular;  Laterality: Left;  . POLYPECTOMY    . POLYPECTOMY  01/25/2018   Procedure: POLYPECTOMY;  Surgeon: Jerene Bears, MD;  Location: [facility];  Service:  Gastroenterology;;  . REVISON OF ARTERIOVENOUS FISTULA Left 4/33/2951   Procedure: PLICATION OF DISTAL ANEURYSMAL SEGEMENT OF LEFT UPPER ARM ARTERIOVENOUS FISTULA;  Surgeon: Elam Dutch, MD;  Location: [facility];  Service: Vascular;  Laterality: Left;  . REVISON OF ARTERIOVENOUS FISTULA Left 8/84/1660   Procedure: Plication of Left Upper Arm Fistula ;  Surgeon: Waynetta Sandy, MD;  Location: [facility];  Service: Vascular;  Laterality: Left;  . SKIN GRAFT SPLIT THICKNESS LEG / FOOT Left    SKIN GRAFT SPLIT THICKNESS LEFT ARM DONOR SITE: LEFT ANTERIOR THIGH  . SKIN SPLIT  GRAFT Left 07/04/2017   Procedure: SKIN GRAFT SPLIT THICKNESS LEFT ARM DONOR SITE: LEFT ANTERIOR THIGH;  Surgeon: Elam Dutch, MD;  Location: [facility];  Service: Vascular;  Laterality: Left;  . THROMBECTOMY W/ EMBOLECTOMY Left 06/05/2017   Procedure: EXPLORATION OF LEFT ARM FOR BLEEDING; OVERSEWED PROXIMAL FISTULA;  Surgeon: Angelia Mould, MD;  Location: [facility];  Service: Vascular;  Laterality: Left;  . WOUND EXPLORATION Left 06/03/2017   Procedure: WOUND EXPLORATION WITH WOUND VAC APPLICATION TO LEFT ARM;  Surgeon: Angelia Mould, MD;  Location: [facility] OR;  Service: Vascular;  Laterality: Left;       Family History  Problem Relation Age of Onset  . Heart disease Mother   . Lung cancer Mother   . Heart disease Father   . Malignant hyperthermia Father   . COPD Father   . Throat cancer Sister   . Esophageal cancer Sister   . Hypertension Other   . COPD Other   . Colon cancer Neg Hx   . Colon polyps Neg Hx   . Rectal cancer Neg Hx   . Stomach cancer Neg Hx     Social History   Tobacco Use  . Smoking status: Current Every Day Smoker    Packs/day: 0.50    Years: 43.00    Pack years: 21.50    Types: Cigarettes    Start date: 08/13/1973  . Smokeless tobacco: Never Used  . Tobacco comment: i dont know i just make   Substance Use Topics  . Alcohol use: Not Currently    Comment: quit drinking in  2017  . Drug use: Not Currently    Types: Marijuana, Cocaine    Comment: reports using once every 3 months, 04-06-2019 was this     Home Medications Prior to Admission medications   Medication Sig Start Date End Date Taking? Authorizing Provider  albuterol (VENTOLIN HFA) 108 (90 Base) MCG/ACT inhaler Inhale 2 puffs into the lungs every 6 (six) hours as needed for wheezing or shortness of breath.   Yes [provider]  budesonide-formoterol (SYMBICORT) 160-4.5 MCG/ACT inhaler Inhale 2 puffs into the lungs 2 (two) times daily.   Yes [provider]  diltiazem (TIAZAC) 240 MG 24 hr capsule Take 240 mg by mouth daily. 08/16/19  Yes [provider]  hydrALAZINE (APRESOLINE) 100 MG tablet Take 100 mg by mouth 2 (two) times daily.   Yes [provider]  hydrOXYzine (ATARAX/VISTARIL) 25 MG tablet Take 25 mg by mouth 2 (two) times daily as needed for itching.  08/21/19  Yes [provider]  lactulose (CHRONULAC) 10 GM/15ML solution Take 45 mLs (30 g total) by mouth daily as needed for mild constipation. 10/01/19  Yes [name], MD  metoprolol tartrate (LOPRESSOR) 100 MG tablet Take 1 tablet (100 mg total) by mouth 2 (two) times daily. 06/04/19  Yes Al Decant, MD  montelukast (SINGULAIR) 10 MG tablet Take 10 mg by mouth every evening.    Yes [provider]  ondansetron (ZOFRAN) 4 MG tablet Take 4 mg by mouth every 4 (four) hours as needed for nausea or vomiting. 09/18/19  Yes [provider]  Oxycodone HCl 10 MG TABS Take 10 mg by mouth 3 (three) times daily as needed (pain).  08/25/19  Yes [provider]  pantoprazole (PROTONIX) 40 MG tablet Take 40 mg by mouth daily.   Yes [provider]  sevelamer carbonate (RENVELA) 800 MG tablet Take 1,600-3,200 mg by mouth See admin instructions.  Take 4 tablets (3200 mg) by mouth 3 times daily with meals and 2 tablets (1600 mg) with snacks   Yes [provider]  zolpidem  (AMBIEN) 10 MG tablet Take 10 mg by mouth at bedtime as needed for sleep.   Yes [provider]    Allergies    Tramadol, Morphine, Morphine and related, Pollen extract, Acetaminophen, Aspirin, Clonidine, and Clonidine derivatives  Review of Systems   Review of Systems  Constitutional:       Per HPI, otherwise negative  HENT:       Per HPI, otherwise negative  Respiratory:       Per HPI, otherwise negative  Cardiovascular:       Per HPI, otherwise negative  Gastrointestinal: Negative for vomiting.  Endocrine:       Negative aside from HPI  Genitourinary:       Neg aside from HPI   Musculoskeletal:       Per HPI, otherwise negative  Skin: Positive for wound.  Allergic/Immunologic: Positive for immunocompromised state.  Neurological: Positive for weakness. Negative for syncope.    Physical Exam Updated Vital Signs BP (!) 205/102 (BP Location: Left Arm)   Pulse (!) 117   Temp 98.5 F (36.9 C) (Oral)   Resp (!) 24   Ht 5\' 9"  (1.753 m)   Wt 66 kg   SpO2 95%   BMI 21.49 kg/m   Physical Exam Vitals and nursing note reviewed.  Constitutional:      General: He is not in acute distress.    Appearance: He is well-developed.     Comments: Chronically ill-appearing adult male awake and alert resting in right lateral decubitus position  HENT:     Head: Normocephalic and atraumatic.  Eyes:     Conjunctiva/sclera: Conjunctivae normal.  Pulmonary:     Effort: Pulmonary effort is normal. No respiratory distress.     Breath sounds: No stridor.  Abdominal:     General: There is no distension.     Tenderness: There is no abdominal tenderness.  Musculoskeletal:     Comments: Right AC fistula unremarkable  Skin:    General: Skin is warm and dry.       Neurological:     Mental Status: He is alert and oriented to person, place, and time.     ED Results / Procedures / Treatments    Procedures Wound care provided by nursing staff ED Course  I have reviewed the  triage vital signs and the nursing notes.  Pertinent labs & imaging results that were available during my care of the patient were reviewed by me and considered in my medical decision making (see chart for details).  After initial evaluation reviewed the patient's chart including documentation from visit earlier today.  I confirmed the patient had dialysis yesterday, intends to go tomorrow. We discussed the importance of following up with her wound care center given the location of his wound.  Today, no evidence for cellulitis, bacteremia, sepsis, and he denies any dyspnea, fever, no indication for additional labs, or emergent dialysis. After dressing change performed by nursing staff, provision of pain medication, patient discharged to follow-up with dialysis tomorrow, wound care later this week. Final Clinical Impression(s) / ED Diagnoses Final diagnoses:  Decubitus ulcer of sacral region, stage 2 [facility])     Carmin Muskrat, MD 10/16/19 2146

## 2019-10-16 NOTE — ED Triage Notes (Signed)
Pt here from home for shob x 1 week. Pt 90% on room air, improved to 100% on 2L Max. Pt denies chest pain. Had full dialysis yesterday. Covid 3/16. Pt uncooperative and irritable, made EMS take IV out that they had placed.

## 2019-10-16 NOTE — ED Triage Notes (Signed)
Patient stated he has a bed sore that needs to be checked

## 2019-10-16 NOTE — ED Provider Notes (Signed)
[facility] EMERGENCY DEPARTMENT Provider Note   CSN: [id number] Arrival date & time: 10/16/19  1200     History Chief Complaint  Patient presents with  . Shortness of Breath    [name] is a 59 y.o. male.  HPI   59 year old male, well-known to the emergency department for history of end-stage renal disease, he is on dialysis, he is very noncompliant, he has been admitted to the hospital twice in the last 3 weeks, both times for complications of renal failure including severe hyperkalemia.  He states that he dialyzed yesterday, he is denying shortness of breath or chest pain to me, he states that he feels more distended in his abdomen and feels like he needs to have paracentesis.  As I look back through the medical record I see that he last had paracentesis on March 14.  The patient denies fevers or chills, he comes by ambulance, he had told the paramedics that he was needing to be seen for shortness of breath, on arrival the patient states he is not short of breath but has complaints of abdominal distention feeling like he is reaccumulating fluid.  Past Medical History:  Diagnosis Date  . Anemia   . Anxiety   . Arthritis    left shoulder  . Atherosclerosis of aorta ([place])   . Cardiomegaly   . Chest pain    DATE UNKNOWN, C/O PERIODICALLY  . Cocaine abuse ([place])   . COPD exacerbation ([place]) 08/17/2016  . Coronary artery disease    stent 02/22/17  . ESRD (end stage renal disease) on dialysis ([place])    "[name]; MWF" (07/04/2017)  . GERD (gastroesophageal reflux disease)    DATE UNKNOWN  . Hemorrhoids   . Hepatitis B, chronic ([place])   . Hepatitis C   . History of kidney stones   . Hyperkalemia   . Hypertension   . Kidney failure   . Metabolic bone disease    Patient denies  . Mitral stenosis   . Myocardial infarction ([place])   . Pneumonia   . Pulmonary edema   . Solitary rectal ulcer syndrome 07/2017   at [facility] for rectal bleeding  .  Tubular adenoma of colon     Patient Active Problem List   Diagnosis Date Noted  . Hypoxia 10/04/2019  . Advanced care planning/counseling discussion   . Renal failure 09/26/2019  . Dyspnea 06/09/2019  . Acquired thrombophilia ([place]) 06/05/2019  . A-fib ([place]) 05/30/2019  . Atrial fibrillation with RVR ([place]) 05/29/2019  . Melena   . Pressure injury of skin 03/09/2019  . Abdominal distention   . Volume overload 12/28/2018  . Sepsis ([place]) 09/12/2018  . Atherosclerosis of native coronary artery of native heart without angina pectoris 03/11/2018  . Benign neoplasm of cecum   . Benign neoplasm of ascending colon   . Benign neoplasm of descending colon   . Benign neoplasm of rectum   . Paroxysmal atrial fibrillation ([place]) 01/23/2018  . Hx of colonic polyps 01/20/2018  . End-stage renal disease on hemodialysis ([place]) 11/21/2017  . GERD (gastroesophageal reflux disease) 11/16/2017  . Decompensated hepatic cirrhosis ([place]) 11/15/2017  . DNR (do not resuscitate)   . Palliative care by specialist   . Hyponatremia 11/04/2017  . SBP (spontaneous bacterial peritonitis) ([place]) 10/30/2017  . Liver disease, chronic 10/30/2017  . SOB (shortness of breath)   . Abdominal pain 10/28/2017  . Upper airway cough syndrome with flattening on f/v loop 10/13/17 c/w vcd 10/17/2017  .  Elevated diaphragm 10/13/2017  . Ileus ([place]) 09/29/2017  . QT prolongation 09/29/2017  . Malnutrition of moderate degree 09/29/2017  . Sinus congestion 09/03/2017  . Symptomatic anemia 09/02/2017  . Other cirrhosis of liver ([place]) 09/02/2017  . Left bundle branch block 09/02/2017  . Mitral stenosis 09/02/2017  . Hematochezia 07/15/2017  . Wide-complex tachycardia ([place])   . Endotracheally intubated   . ESRD on dialysis ([place]) 07/04/2017  . Acute respiratory failure with hypoxia ([place]) 06/18/2017  . CKD (chronic kidney disease) stage V requiring chronic dialysis ([place]) 06/18/2017  . History of Cocaine abuse ([place]) 06/18/2017  .  Hypertension 06/18/2017  . Infection of AV graft for dialysis ([place]) 06/18/2017  . Anxiety 06/18/2017  . Anemia due to chronic kidney disease 06/18/2017  . Atypical atrial flutter ([place]) 06/18/2017  . Personality disorder ([place]) 06/13/2017  . Cellulitis 06/12/2017  . Adjustment disorder with mixed anxiety and depressed mood 06/10/2017  . Suicidal ideation 06/10/2017  . Arm wound, left, sequela 06/10/2017  . Dyspnea on exertion 05/29/2017  . Tachycardia 05/29/2017  . Hyperkalemia 05/22/2017  . Acute metabolic encephalopathy   . Anemia 04/23/2017  . Ascites 04/23/2017  . COPD (chronic obstructive pulmonary disease) ([place]) 04/23/2017  . Acute on chronic respiratory failure with hypoxia ([place]) 03/25/2017  . Arrhythmia 03/25/2017  . COPD GOLD 0 with flattening on inps f/v  09/27/2016  . Essential hypertension 09/27/2016  . Fluid overload 08/30/2016  . COPD exacerbation ([place]) 08/17/2016  . Hypertensive urgency 08/17/2016  . Respiratory failure ([place]) 08/17/2016  . Problem with dialysis access ([place]) 07/23/2016  . Chronic hepatitis B ([place]) 03/05/2014  . Chronic hepatitis C without hepatic coma ([place]) 03/05/2014  . Internal hemorrhoids with bleeding, swelling and itching 03/05/2014  . Thrombocytopenia ([place]) 03/05/2014  . Chest pain 02/27/2014  . Alcohol abuse 04/14/2009  . Cigarette smoker 04/14/2009  . GANGLION CYST 04/14/2009    Past Surgical History:  Procedure Laterality Date  . A/V FISTULAGRAM Left 05/26/2017   Procedure: A/V FISTULAGRAM;  Surgeon: Conrad Coffeeville, MD;  Location: [facility] [facility];  Service: Cardiovascular;  Laterality: Left;  . A/V FISTULAGRAM Right 11/18/2017   Procedure: A/V FISTULAGRAM - Right Arm;  Surgeon: Elam Dutch, MD;  Location: [facility];  Service: Cardiovascular;  Laterality: Right;  . APPLICATION OF WOUND VAC Left 06/14/2017   Procedure: APPLICATION OF WOUND VAC;  Surgeon: Katha Cabal, MD;  Location: [facility];  Service: Vascular;   Laterality: Left;  . AV FISTULA PLACEMENT  2012   BELIEVED WAS PLACED IN JUNE  . AV FISTULA PLACEMENT Right 08/09/2017   Procedure: Creation Right arm ARTERIOVENOUS BRACHIOCEPOHALIC FISTULA;  Surgeon: Elam Dutch, MD;  Location: [facility];  Service: Vascular;  Laterality: Right;  . AV FISTULA PLACEMENT Right 11/22/2017   Procedure: INSERTION OF ARTERIOVENOUS (AV) GORE-TEX GRAFT RIGHT UPPER ARM;  Surgeon: Elam Dutch, MD;  Location: [place] [facility];  Service: Vascular;  Laterality: Right;  . BIOPSY  01/25/2018   Procedure: BIOPSY;  Surgeon: Jerene Bears, MD;  Location: [facility];  Service: Gastroenterology;;  . BIOPSY  04/10/2019   Procedure: BIOPSY;  Surgeon: Jerene Bears, MD;  Location: [facility];  Service: Gastroenterology;;  . COLONOSCOPY    . COLONOSCOPY WITH PROPOFOL N/A 01/25/2018   Procedure: COLONOSCOPY WITH PROPOFOL;  Surgeon: Jerene Bears, MD;  Location: [facility];  Service: Gastroenterology;  Laterality: N/A;  . CORONARY STENT INTERVENTION N/A 02/22/2017   Procedure: CORONARY STENT INTERVENTION;  Surgeon: Vernell Leep  J, MD;  Location: [facility];  Service: Cardiovascular;  Laterality: N/A;  . ESOPHAGOGASTRODUODENOSCOPY (EGD) WITH PROPOFOL N/A 01/25/2018   Procedure: ESOPHAGOGASTRODUODENOSCOPY (EGD) WITH PROPOFOL;  Surgeon: Jerene Bears, MD;  Location: [facility];  Service: Gastroenterology;  Laterality: N/A;  . ESOPHAGOGASTRODUODENOSCOPY (EGD) WITH PROPOFOL N/A 04/10/2019   Procedure: ESOPHAGOGASTRODUODENOSCOPY (EGD) WITH PROPOFOL;  Surgeon: Jerene Bears, MD;  [facility];  Service: Gastroenterology;  Laterality: N/A;  . FLEXIBLE SIGMOIDOSCOPY N/A 07/15/2017   Procedure: FLEXIBLE SIGMOIDOSCOPY;  Surgeon: Carol Ada, MD;  Location: [facility];  Service: Endoscopy;  Laterality: N/A;  . HEMORRHOID BANDING    . I & D EXTREMITY Left 06/01/2017   Procedure: IRRIGATION AND DEBRIDEMENT LEFT ARM HEMATOMA WITH LIGATION OF LEFT ARM AV FISTULA;  Surgeon:  Elam Dutch, MD;  Location: [facility];  Service: Vascular;  Laterality: Left;  . I & D EXTREMITY Left 06/14/2017   Procedure: IRRIGATION AND DEBRIDEMENT EXTREMITY;  Surgeon: Katha Cabal, MD;  Location: [facility];  Service: Vascular;  Laterality: Left;  . INSERTION OF DIALYSIS CATHETER  05/30/2017  . INSERTION OF DIALYSIS CATHETER N/A 05/30/2017   Procedure: INSERTION OF DIALYSIS CATHETER;  Surgeon: Elam Dutch, MD;  Location: [facility];  Service: Vascular;  Laterality: N/A;  . IR PARACENTESIS  08/30/2017  . IR PARACENTESIS  09/29/2017  . IR PARACENTESIS  10/28/2017  . IR PARACENTESIS  11/09/2017  . IR PARACENTESIS  11/16/2017  . IR PARACENTESIS  11/28/2017  . IR PARACENTESIS  12/01/2017  . IR PARACENTESIS  12/06/2017  . IR PARACENTESIS  01/03/2018  . IR PARACENTESIS  01/23/2018  . IR PARACENTESIS  02/07/2018  . IR PARACENTESIS  02/21/2018  . IR PARACENTESIS  03/06/2018  . IR PARACENTESIS  03/17/2018  . IR PARACENTESIS  04/04/2018  . IR PARACENTESIS  12/28/2018  . IR PARACENTESIS  01/08/2019  . IR PARACENTESIS  01/23/2019  . IR PARACENTESIS  02/01/2019  . IR PARACENTESIS  02/19/2019  . IR PARACENTESIS  03/01/2019  . IR PARACENTESIS  03/15/2019  . IR PARACENTESIS  04/03/2019  . IR PARACENTESIS  04/12/2019  . IR PARACENTESIS  05/01/2019  . IR PARACENTESIS  05/08/2019  . IR PARACENTESIS  05/24/2019  . IR PARACENTESIS  06/12/2019  . IR PARACENTESIS  07/09/2019  . IR PARACENTESIS  07/27/2019  . IR PARACENTESIS  08/09/2019  . IR PARACENTESIS  08/21/2019  . IR PARACENTESIS  09/17/2019  . IR PARACENTESIS  10/05/2019  . IR RADIOLOGIST EVAL & MGMT  02/14/2018  . IR RADIOLOGIST EVAL & MGMT  02/22/2019  . LEFT HEART CATH AND CORONARY ANGIOGRAPHY N/A 02/22/2017   Procedure: LEFT HEART CATH AND CORONARY ANGIOGRAPHY;  Surgeon: Nigel Mormon, MD;  Location: [facility];  Service: Cardiovascular;  Laterality: N/A;  . LIGATION OF ARTERIOVENOUS  FISTULA Left 08/16/2351   Procedure: Plication of Left Arm  Arteriovenous Fistula;  Surgeon: Elam Dutch, MD;  Location: [facility];  Service: Vascular;  Laterality: Left;  . POLYPECTOMY    . POLYPECTOMY  01/25/2018   Procedure: POLYPECTOMY;  Surgeon: Jerene Bears, MD;  Location: [facility];  Service: Gastroenterology;;  . REVISON OF ARTERIOVENOUS FISTULA Left 12/23/4313   Procedure: PLICATION OF DISTAL ANEURYSMAL SEGEMENT OF LEFT UPPER ARM ARTERIOVENOUS FISTULA;  Surgeon: Elam Dutch, MD;  Location: [facility];  Service: Vascular;  Laterality: Left;  . REVISON OF ARTERIOVENOUS FISTULA Left 4/00/8676   Procedure: Plication of Left Upper Arm Fistula ;  Surgeon: Georgia Dom  Donzetta Matters, MD;  Location: [facility];  Service: Vascular;  Laterality: Left;  . SKIN GRAFT SPLIT THICKNESS LEG / FOOT Left    SKIN GRAFT SPLIT THICKNESS LEFT ARM DONOR SITE: LEFT ANTERIOR THIGH  . SKIN SPLIT GRAFT Left 07/04/2017   Procedure: SKIN GRAFT SPLIT THICKNESS LEFT ARM DONOR SITE: LEFT ANTERIOR THIGH;  Surgeon: Elam Dutch, MD;  Location: [facility];  Service: Vascular;  Laterality: Left;  . THROMBECTOMY W/ EMBOLECTOMY Left 06/05/2017   Procedure: EXPLORATION OF LEFT ARM FOR BLEEDING; OVERSEWED PROXIMAL FISTULA;  Surgeon: Angelia Mould, MD;  Location: [facility];  Service: Vascular;  Laterality: Left;  . WOUND EXPLORATION Left 06/03/2017   Procedure: WOUND EXPLORATION WITH WOUND VAC APPLICATION TO LEFT ARM;  Surgeon: Angelia Mould, MD;  Location: [facility] OR;  Service: Vascular;  Laterality: Left;       Family History  Problem Relation Age of Onset  . Heart disease Mother   . Lung cancer Mother   . Heart disease Father   . Malignant hyperthermia Father   . COPD Father   . Throat cancer Sister   . Esophageal cancer Sister   . Hypertension Other   . COPD Other   . Colon cancer Neg Hx   . Colon polyps Neg Hx   . Rectal cancer Neg Hx   . Stomach cancer Neg Hx     Social History   Tobacco Use  . Smoking status: Current Every Day Smoker    Packs/day: 0.50      Years: 43.00    Pack years: 21.50    Types: Cigarettes    Start date: 08/13/1973  . Smokeless tobacco: Never Used  . Tobacco comment: i dont know i just make   Substance Use Topics  . Alcohol use: Not Currently    Comment: quit drinking in 2017  . Drug use: Not Currently    Types: Marijuana, Cocaine    Comment: reports using once every 3 months, 04-06-2019 was this     Home Medications Prior to Admission medications   Medication Sig Start Date End Date Taking? Authorizing Provider  albuterol (VENTOLIN HFA) 108 (90 Base) MCG/ACT inhaler Inhale 2 puffs into the lungs every 6 (six) hours as needed for wheezing or shortness of breath.    [provider]  budesonide-formoterol (SYMBICORT) 160-4.5 MCG/ACT inhaler Inhale 2 puffs into the lungs 2 (two) times daily.    [provider]  diltiazem (TIAZAC) 240 MG 24 hr capsule Take 240 mg by mouth daily. 08/16/19   [provider]  hydrALAZINE (APRESOLINE) 100 MG tablet Take 100 mg by mouth 2 (two) times daily.    [provider]  hydrOXYzine (ATARAX/VISTARIL) 25 MG tablet Take 25 mg by mouth 2 (two) times daily as needed for itching.  08/21/19   [provider]  lactulose (CHRONULAC) 10 GM/15ML solution Take 45 mLs (30 g total) by mouth daily as needed for mild constipation. 10/01/19   [name], MD  metoprolol tartrate (LOPRESSOR) 100 MG tablet Take 1 tablet (100 mg total) by mouth 2 (two) times daily. 06/04/19   Al Decant, MD  montelukast (SINGULAIR) 10 MG tablet Take 10 mg by mouth every evening.     [provider]  ondansetron (ZOFRAN) 4 MG tablet Take 4 mg by mouth every 4 (four) hours as needed for nausea or vomiting. 09/18/19   [provider]  Oxycodone HCl 10 MG TABS Take 10 mg by mouth 3 (three) times daily as needed (  pain).  08/25/19   [provider]  pantoprazole (PROTONIX) 40 MG tablet Take 40 mg by mouth daily.    [provider]  sevelamer  carbonate (RENVELA) 800 MG tablet Take 1,600-3,200 mg by mouth See admin instructions. Take 4 tablets (3200 mg) by mouth 3 times daily with meals and 2 tablets (1600 mg) with snacks    [provider]  zolpidem (AMBIEN) 10 MG tablet Take 10 mg by mouth at bedtime as needed for sleep.    [provider]    Allergies    Tramadol, Morphine, Morphine and related, Pollen extract, Acetaminophen, Aspirin, Clonidine, and Clonidine derivatives  Review of Systems   Review of Systems  All other systems reviewed and are negative.   Physical Exam Updated Vital Signs BP (!) 140/91   Pulse (!) 114   Resp (!) 26   SpO2 96%   Physical Exam Vitals and nursing note reviewed.  Constitutional:      General: He is not in acute distress.    Appearance: He is well-developed.  HENT:     Head: Normocephalic and atraumatic.     Mouth/Throat:     Pharynx: No oropharyngeal exudate.  Eyes:     General: No scleral icterus.       Right eye: No discharge.        Left eye: No discharge.     Conjunctiva/sclera: Conjunctivae normal.     Pupils: Pupils are equal, round, and reactive to light.  Neck:     Thyroid: No thyromegaly.     Vascular: No JVD.  Cardiovascular:     Rate and Rhythm: Normal rate and regular rhythm.     Heart sounds: Murmur present. No friction rub. No gallop.   Pulmonary:     Effort: Pulmonary effort is normal. No respiratory distress.     Breath sounds: Normal breath sounds. No wheezing or rales.     Comments: Clear breath sounds, no rales, speaks in full sentences Abdominal:     General: Bowel sounds are normal. There is distension.     Palpations: Abdomen is soft. There is no mass.     Tenderness: There is no abdominal tenderness. There is no guarding or rebound.     Hernia: No hernia is present.     Comments: Moderately distended abdomen, no anasarca, no focal tenderness, positive fluid wave  Musculoskeletal:        General: No tenderness. Normal range of  motion.     Cervical back: Normal range of motion and neck supple.     Right lower leg: Edema present.     Left lower leg: Edema present.     Comments: Edema is present in the bilateral lower extremities only at the ankles, no pretibial edema  Lymphadenopathy:     Cervical: No cervical adenopathy.  Skin:    General: Skin is warm and dry.     Findings: No erythema or rash.  Neurological:     Mental Status: He is alert.     Coordination: Coordination normal.  Psychiatric:        Behavior: Behavior normal.     ED Results / Procedures / Treatments   Labs (all labs ordered are listed, but only abnormal results are displayed) Labs Reviewed  BASIC METABOLIC PANEL - Abnormal; Notable for the following components:      Result Value   Chloride 94 (*)    Creatinine, Ser 4.42 (*)    GFR calc non Af Amer 14 (*)  GFR calc Af Amer 16 (*)    All other components within normal limits    EKG None  Radiology No results found.  Procedures Procedures (including critical care time)  Medications Ordered in ED Medications - No data to display  ED Course  I have reviewed the triage vital signs and the nursing notes.  Pertinent labs & imaging results that were available during my care of the patient were reviewed by me and considered in my medical decision making (see chart for details).    MDM Rules/Calculators/A&P                      Though this patient is chronically ill he is here for a very focal complaint of having abdominal distention secondary to his chronic liver failure and cirrhosis.  I will perform a bedside ultrasound to see if he has any fluid that would be amenable to drainage, he is not short of breath, he is not hypoxic, he is not tachycardic.  He states that he dialyzed yesterday, will confirm with metabolic panel to make sure he is not severely hyperkalemic as he has been in the past.  Potassium 4.8, patient stable for discharge, bedside ultrasound reveals that the  patient does not have enough fluid to safely perform paracentesis.  He was instructed of this and states "I guess I will just go home" states that he has no other indications for being here and still denies shortness of breath  On discharge the patient is ambulatory without any distress    Final Clinical Impression(s) / ED Diagnoses Final diagnoses:  Ascites of liver    Rx / DC Orders ED Discharge Orders    None       Noemi Chapel, MD 10/17/19 [phone]

## 2019-10-16 NOTE — ED Notes (Signed)
Sacral wound dressing changed.

## 2019-10-16 NOTE — Discharge Instructions (Signed)
As discussed, your evaluation today has been largely reassuring.  But, it is important that you monitor your condition carefully, and do not hesitate to return to the ED if you develop new, or concerning changes in your condition. ? ?Otherwise, please follow-up with your physician for appropriate ongoing care. ? ?

## 2019-10-16 NOTE — Discharge Instructions (Signed)
If you should develop severe or worsening abdominal distention or pain return for evaluation for the need for taking fluid off of your abdomen.  At this time you do not have enough fluid to remove.  Please continue to go to dialysis, your blood work was reassuring, you do not need emergent dialysis at this time

## 2019-10-20 ENCOUNTER — Encounter (HOSPITAL_COMMUNITY): Payer: Self-pay | Admitting: Emergency Medicine

## 2019-10-20 ENCOUNTER — Emergency Department (HOSPITAL_COMMUNITY): Payer: Medicare Other

## 2019-10-20 ENCOUNTER — Emergency Department (HOSPITAL_COMMUNITY)
Admission: EM | Admit: 2019-10-20 | Discharge: 2019-10-20 | Disposition: A | Discharge status: Home / Self Care | Payer: Medicare Other | Source: Home / Self Care | Attending: Emergency Medicine | Admitting: Emergency Medicine

## 2019-10-20 ENCOUNTER — Other Ambulatory Visit: Payer: Self-pay

## 2019-10-20 DIAGNOSIS — Z992 Dependence on renal dialysis: Secondary | ICD-10-CM | POA: Insufficient documentation

## 2019-10-20 DIAGNOSIS — N186 End stage renal disease: Secondary | ICD-10-CM

## 2019-10-20 DIAGNOSIS — R188 Other ascites: Secondary | ICD-10-CM | POA: Insufficient documentation

## 2019-10-20 DIAGNOSIS — Z79899 Other long term (current) drug therapy: Secondary | ICD-10-CM | POA: Insufficient documentation

## 2019-10-20 DIAGNOSIS — R1084 Generalized abdominal pain: Secondary | ICD-10-CM | POA: Insufficient documentation

## 2019-10-20 DIAGNOSIS — I2510 Atherosclerotic heart disease of native coronary artery without angina pectoris: Secondary | ICD-10-CM | POA: Insufficient documentation

## 2019-10-20 DIAGNOSIS — I120 Hypertensive chronic kidney disease with stage 5 chronic kidney disease or end stage renal disease: Secondary | ICD-10-CM | POA: Insufficient documentation

## 2019-10-20 DIAGNOSIS — K7460 Unspecified cirrhosis of liver: Secondary | ICD-10-CM | POA: Insufficient documentation

## 2019-10-20 DIAGNOSIS — Z66 Do not resuscitate: Secondary | ICD-10-CM | POA: Insufficient documentation

## 2019-10-20 DIAGNOSIS — J449 Chronic obstructive pulmonary disease, unspecified: Secondary | ICD-10-CM | POA: Insufficient documentation

## 2019-10-20 DIAGNOSIS — F17210 Nicotine dependence, cigarettes, uncomplicated: Secondary | ICD-10-CM | POA: Insufficient documentation

## 2019-10-20 DIAGNOSIS — K7469 Other cirrhosis of liver: Secondary | ICD-10-CM | POA: Diagnosis not present

## 2019-10-20 LAB — COMPREHENSIVE METABOLIC PANEL
ALT: 18 U/L (ref 0–44)
AST: 29 U/L (ref 15–41)
Albumin: 2.8 g/dL — ABNORMAL LOW (ref 3.5–5.0)
Alkaline Phosphatase: 164 U/L — ABNORMAL HIGH (ref 38–126)
Anion gap: 16 — ABNORMAL HIGH (ref 5–15)
BUN: 23 mg/dL — ABNORMAL HIGH (ref 6–20)
CO2: 28 mmol/L (ref 22–32)
Calcium: 9.4 mg/dL (ref 8.9–10.3)
Chloride: 94 mmol/L — ABNORMAL LOW (ref 98–111)
Creatinine, Ser: 4.3 mg/dL — ABNORMAL HIGH (ref 0.61–1.24)
GFR calc Af Amer: 17 mL/min — ABNORMAL LOW (ref >60)
GFR calc non Af Amer: 14 mL/min — ABNORMAL LOW (ref >60)
Glucose, Bld: 98 mg/dL (ref 70–99)
Potassium: 4.8 mmol/L (ref 3.5–5.1)
Sodium: 138 mmol/L (ref 135–145)
Total Bilirubin: 0.9 mg/dL (ref 0.3–1.2)
Total Protein: 6.3 g/dL — ABNORMAL LOW (ref 6.5–8.1)

## 2019-10-20 LAB — ETHANOL: Alcohol, Ethyl (B): <10 mg/dL (ref <10)

## 2019-10-20 LAB — CBC WITH DIFFERENTIAL/PLATELET
Abs Immature Granulocytes: 0.04 10*3/uL (ref 0.00–0.07)
Basophils Absolute: 0.2 10*3/uL — ABNORMAL HIGH (ref 0.0–0.1)
Basophils Relative: 2 %
Eosinophils Absolute: 0.2 10*3/uL (ref 0.0–0.5)
Eosinophils Relative: 2 %
HCT: 24.1 % — ABNORMAL LOW (ref 39.0–52.0)
Hemoglobin: 8.1 g/dL — ABNORMAL LOW (ref 13.0–17.0)
Immature Granulocytes: 1 %
Lymphocytes Relative: 17 %
Lymphs Abs: 1.4 10*3/uL (ref 0.7–4.0)
MCH: 26.4 pg (ref 26.0–34.0)
MCHC: 33.6 g/dL (ref 30.0–36.0)
MCV: 78.5 fL — ABNORMAL LOW (ref 80.0–100.0)
Monocytes Absolute: 0.6 10*3/uL (ref 0.1–1.0)
Monocytes Relative: 8 %
Neutro Abs: 5.8 10*3/uL (ref 1.7–7.7)
Neutrophils Relative %: 70 %
Platelets: 202 10*3/uL (ref 150–400)
RBC: 3.07 MIL/uL — ABNORMAL LOW (ref 4.22–5.81)
RDW: 15.6 % — ABNORMAL HIGH (ref 11.5–15.5)
WBC: 8.1 10*3/uL (ref 4.0–10.5)
nRBC: 0 % (ref 0.0–0.2)

## 2019-10-20 LAB — LIPASE, BLOOD: Lipase: 22 U/L (ref 11–51)

## 2019-10-20 LAB — PROTIME-INR
INR: 1.2 (ref 0.8–1.2)
Prothrombin Time: 14.6 seconds (ref 11.4–15.2)

## 2019-10-20 LAB — MAGNESIUM: Magnesium: 1.9 mg/dL (ref 1.7–2.4)

## 2019-10-20 MED ORDER — HYDROMORPHONE HCL 1 MG/ML IJ SOLN
1.0000 mg | Freq: Once | INTRAMUSCULAR | Status: AC
  Administered 2019-10-20: 1 mg via INTRAVENOUS
  Filled 2019-10-20: qty 1

## 2019-10-20 NOTE — ED Notes (Signed)
sats 91, 92% after administration of dilaudid; pt placed on 3L; sats 98%

## 2019-10-20 NOTE — ED Provider Notes (Signed)
[facility] EMERGENCY DEPARTMENT Provider Note   CSN: [id number] Arrival date & time: 10/20/19  8182     History Chief Complaint  Patient presents with  . Abdominal Pain    [name] is a 59 y.o. male.  Pt presents to the ED today with abdominal pain.  He has a hx of cirrhosis and ascites.  He last had a paracentesis on 3/14.  Pt was here for the same on 4/6.  He also has a hx of ESRD on HD and is very noncompliant.  However, he said he did go yesterday.  He does have sob, but this is not much worse than usual.  No cp.  No f/c.  No cough.        Past Medical History:  Diagnosis Date  . Anemia   . Anxiety   . Arthritis    left shoulder  . Atherosclerosis of aorta ([place])   . Cardiomegaly   . Chest pain    DATE UNKNOWN, C/O PERIODICALLY  . Cocaine abuse ([place])   . COPD exacerbation ([place]) 08/17/2016  . Coronary artery disease    stent 02/22/17  . ESRD (end stage renal disease) on dialysis ([place])    "[name]; MWF" (07/04/2017)  . GERD (gastroesophageal reflux disease)    DATE UNKNOWN  . Hemorrhoids   . Hepatitis B, chronic ([place])   . Hepatitis C   . History of kidney stones   . Hyperkalemia   . Hypertension   . Kidney failure   . Metabolic bone disease    Patient denies  . Mitral stenosis   . Myocardial infarction ([place])   . Pneumonia   . Pulmonary edema   . Solitary rectal ulcer syndrome 07/2017   at flex sig for rectal bleeding  . Tubular adenoma of colon     Patient Active Problem List   Diagnosis Date Noted  . Hypoxia 10/04/2019  . Advanced care planning/counseling discussion   . Renal failure 09/26/2019  . Dyspnea 06/09/2019  . Acquired thrombophilia ([place]) 06/05/2019  . A-fib ([place]) 05/30/2019  . Atrial fibrillation with RVR ([place]) 05/29/2019  . Melena   . Pressure injury of skin 03/09/2019  . Abdominal distention   . Volume overload 12/28/2018  . Sepsis ([place]) 09/12/2018  . Atherosclerosis of native coronary artery of  native heart without angina pectoris 03/11/2018  . Benign neoplasm of cecum   . Benign neoplasm of ascending colon   . Benign neoplasm of descending colon   . Benign neoplasm of rectum   . Paroxysmal atrial fibrillation ([place]) 01/23/2018  . Hx of colonic polyps 01/20/2018  . End-stage renal disease on hemodialysis ([employer]) 11/21/2017  . GERD (gastroesophageal reflux disease) 11/16/2017  . Decompensated hepatic cirrhosis ([place]) 11/15/2017  . DNR (do not resuscitate)   . Palliative care by specialist   . Hyponatremia 11/04/2017  . SBP (spontaneous bacterial peritonitis) ([place]) 10/30/2017  . Liver disease, chronic 10/30/2017  . SOB (shortness of breath)   . Abdominal pain 10/28/2017  . Upper airway cough syndrome with flattening on f/v loop 10/13/17 c/w vcd 10/17/2017  . Elevated diaphragm 10/13/2017  . Ileus ([place]) 09/29/2017  . QT prolongation 09/29/2017  . Malnutrition of moderate degree 09/29/2017  . Sinus congestion 09/03/2017  . Symptomatic anemia 09/02/2017  . Other cirrhosis of liver ([place]) 09/02/2017  . Left bundle branch block 09/02/2017  . Mitral stenosis 09/02/2017  . Hematochezia 07/15/2017  . Wide-complex tachycardia ([place])   . Endotracheally intubated   .  ESRD on dialysis ([place]) 07/04/2017  . Acute respiratory failure with hypoxia ([place]) 06/18/2017  . CKD (chronic kidney disease) stage V requiring chronic dialysis ([employer]) 06/18/2017  . History of Cocaine abuse ([place]) 06/18/2017  . Hypertension 06/18/2017  . Infection of AV graft for dialysis ([place]) 06/18/2017  . Anxiety 06/18/2017  . Anemia due to chronic kidney disease 06/18/2017  . Atypical atrial flutter ([place]) 06/18/2017  . Personality disorder ([place]) 06/13/2017  . Cellulitis 06/12/2017  . Adjustment disorder with mixed anxiety and depressed mood 06/10/2017  . Suicidal ideation 06/10/2017  . Arm wound, left, sequela 06/10/2017  . Dyspnea on exertion 05/29/2017  . Tachycardia 05/29/2017  . Hyperkalemia 05/22/2017  . Acute  metabolic encephalopathy   . Anemia 04/23/2017  . Ascites 04/23/2017  . COPD (chronic obstructive pulmonary disease) ([place]) 04/23/2017  . Acute on chronic respiratory failure with hypoxia ([place]) 03/25/2017  . Arrhythmia 03/25/2017  . COPD GOLD 0 with flattening on inps f/v  09/27/2016  . Essential hypertension 09/27/2016  . Fluid overload 08/30/2016  . COPD exacerbation ([name]) 08/17/2016  . Hypertensive urgency 08/17/2016  . Respiratory failure ([place]) 08/17/2016  . Problem with dialysis access ([place]) 07/23/2016  . Chronic hepatitis B ([place]) 03/05/2014  . Chronic hepatitis C without hepatic coma ([place]) 03/05/2014  . Internal hemorrhoids with bleeding, swelling and itching 03/05/2014  . Thrombocytopenia ([place]) 03/05/2014  . Chest pain 02/27/2014  . Alcohol abuse 04/14/2009  . Cigarette smoker 04/14/2009  . GANGLION CYST 04/14/2009    Past Surgical History:  Procedure Laterality Date  . A/V FISTULAGRAM Left 05/26/2017   Procedure: A/V FISTULAGRAM;  Surgeon: Conrad Raytown, MD;  Location: [place] [facility];  Service: Cardiovascular;  Laterality: Left;  . A/V FISTULAGRAM Right 11/18/2017   Procedure: A/V FISTULAGRAM - Right Arm;  Surgeon: Elam Dutch, MD;  Location: [place] [facility];  Service: Cardiovascular;  Laterality: Right;  . APPLICATION OF WOUND VAC Left 06/14/2017   Procedure: APPLICATION OF WOUND VAC;  Surgeon: Katha Cabal, MD;  Location: [facility];  Service: Vascular;  Laterality: Left;  . AV FISTULA PLACEMENT  2012   BELIEVED WAS PLACED IN JUNE  . AV FISTULA PLACEMENT Right 08/09/2017   Procedure: Creation Right arm ARTERIOVENOUS BRACHIOCEPOHALIC FISTULA;  Surgeon: Elam Dutch, MD;  Location: [facility] OR;  Service: Vascular;  Laterality: Right;  . AV FISTULA PLACEMENT Right 11/22/2017   Procedure: INSERTION OF ARTERIOVENOUS (AV) GORE-TEX GRAFT RIGHT UPPER ARM;  Surgeon: Elam Dutch, MD;  Location: [place];  Service: Vascular;  Laterality: Right;  . BIOPSY  01/25/2018    Procedure: BIOPSY;  Surgeon: Jerene Bears, MD;  Location: [place];  Service: Gastroenterology;;  . BIOPSY  04/10/2019   Procedure: BIOPSY;  Surgeon: Jerene Bears, MD;  Location: [facility];  Service: Gastroenterology;;  . COLONOSCOPY    . COLONOSCOPY WITH PROPOFOL N/A 01/25/2018   Procedure: COLONOSCOPY WITH PROPOFOL;  Surgeon: Jerene Bears, MD;  Location: [place];  Service: Gastroenterology;  Laterality: N/A;  . CORONARY STENT INTERVENTION N/A 02/22/2017   Procedure: CORONARY STENT INTERVENTION;  Surgeon: Nigel Mormon, MD;  Location: [place] [facility];  Service: Cardiovascular;  Laterality: N/A;  . ESOPHAGOGASTRODUODENOSCOPY (EGD) WITH PROPOFOL N/A 01/25/2018   Procedure: ESOPHAGOGASTRODUODENOSCOPY (EGD) WITH PROPOFOL;  Surgeon: Jerene Bears, MD;  Location: [place];  Service: Gastroenterology;  Laterality: N/A;  . ESOPHAGOGASTRODUODENOSCOPY (EGD) WITH PROPOFOL N/A 04/10/2019   Procedure: ESOPHAGOGASTRODUODENOSCOPY (EGD) WITH PROPOFOL;  Surgeon: Jerene Bears, MD;  Location: [facility];  Service: Gastroenterology;  Laterality: N/A;  . FLEXIBLE SIGMOIDOSCOPY N/A 07/15/2017   Procedure: FLEXIBLE SIGMOIDOSCOPY;  Surgeon: Carol Ada, MD;  Location: [place];  Service: Endoscopy;  Laterality: N/A;  . HEMORRHOID BANDING    . I & D EXTREMITY Left 06/01/2017   Procedure: IRRIGATION AND DEBRIDEMENT LEFT ARM HEMATOMA WITH LIGATION OF LEFT ARM AV FISTULA;  Surgeon: Elam Dutch, MD;  Location: [place];  Service: Vascular;  Laterality: Left;  . I & D EXTREMITY Left 06/14/2017   Procedure: IRRIGATION AND DEBRIDEMENT EXTREMITY;  Surgeon: Katha Cabal, MD;  Location: [facility];  Service: Vascular;  Laterality: Left;  . INSERTION OF DIALYSIS CATHETER  05/30/2017  . INSERTION OF DIALYSIS CATHETER N/A 05/30/2017   Procedure: INSERTION OF DIALYSIS CATHETER;  Surgeon: Elam Dutch, MD;  Location: [place];  Service: Vascular;  Laterality: N/A;  . IR PARACENTESIS   08/30/2017  . IR PARACENTESIS  09/29/2017  . IR PARACENTESIS  10/28/2017  . IR PARACENTESIS  11/09/2017  . IR PARACENTESIS  11/16/2017  . IR PARACENTESIS  11/28/2017  . IR PARACENTESIS  12/01/2017  . IR PARACENTESIS  12/06/2017  . IR PARACENTESIS  01/03/2018  . IR PARACENTESIS  01/23/2018  . IR PARACENTESIS  02/07/2018  . IR PARACENTESIS  02/21/2018  . IR PARACENTESIS  03/06/2018  . IR PARACENTESIS  03/17/2018  . IR PARACENTESIS  04/04/2018  . IR PARACENTESIS  12/28/2018  . IR PARACENTESIS  01/08/2019  . IR PARACENTESIS  01/23/2019  . IR PARACENTESIS  02/01/2019  . IR PARACENTESIS  02/19/2019  . IR PARACENTESIS  03/01/2019  . IR PARACENTESIS  03/15/2019  . IR PARACENTESIS  04/03/2019  . IR PARACENTESIS  04/12/2019  . IR PARACENTESIS  05/01/2019  . IR PARACENTESIS  05/08/2019  . IR PARACENTESIS  05/24/2019  . IR PARACENTESIS  06/12/2019  . IR PARACENTESIS  07/09/2019  . IR PARACENTESIS  07/27/2019  . IR PARACENTESIS  08/09/2019  . IR PARACENTESIS  08/21/2019  . IR PARACENTESIS  09/17/2019  . IR PARACENTESIS  10/05/2019  . IR RADIOLOGIST EVAL & MGMT  02/14/2018  . IR RADIOLOGIST EVAL & MGMT  02/22/2019  . LEFT HEART CATH AND CORONARY ANGIOGRAPHY N/A 02/22/2017   Procedure: LEFT HEART CATH AND CORONARY ANGIOGRAPHY;  Surgeon: Nigel Mormon, MD;  Location: [place] [facility];  Service: Cardiovascular;  Laterality: N/A;  . LIGATION OF ARTERIOVENOUS  FISTULA Left 02/14/7618   Procedure: Plication of Left Arm Arteriovenous Fistula;  Surgeon: Elam Dutch, MD;  Location: [place];  Service: Vascular;  Laterality: Left;  . POLYPECTOMY    . POLYPECTOMY  01/25/2018   Procedure: POLYPECTOMY;  Surgeon: Jerene Bears, MD;  Location: [place];  Service: Gastroenterology;;  . REVISON OF ARTERIOVENOUS FISTULA Left 11/17/3265   Procedure: PLICATION OF DISTAL ANEURYSMAL SEGEMENT OF LEFT UPPER ARM ARTERIOVENOUS FISTULA;  Surgeon: Elam Dutch, MD;  Location: [place];  Service: Vascular;  Laterality: Left;  . REVISON OF  ARTERIOVENOUS FISTULA Left 08/05/5807   Procedure: Plication of Left Upper Arm Fistula ;  Surgeon: Waynetta Sandy, MD;  Location: [place];  Service: Vascular;  Laterality: Left;  . SKIN GRAFT SPLIT THICKNESS LEG / FOOT Left    SKIN GRAFT SPLIT THICKNESS LEFT ARM DONOR SITE: LEFT ANTERIOR THIGH  . SKIN SPLIT GRAFT Left 07/04/2017   Procedure: SKIN GRAFT SPLIT THICKNESS LEFT ARM DONOR SITE: LEFT ANTERIOR THIGH;  Surgeon: Elam Dutch, MD;  Location: [place];  Service: Vascular;  Laterality: Left;  . THROMBECTOMY W/ EMBOLECTOMY Left 06/05/2017   Procedure: EXPLORATION OF LEFT ARM FOR BLEEDING; OVERSEWED PROXIMAL FISTULA;  Surgeon: Angelia Mould, MD;  Location: [place];  Service: Vascular;  Laterality: Left;  . WOUND EXPLORATION Left 06/03/2017   Procedure: WOUND EXPLORATION WITH WOUND VAC APPLICATION TO LEFT ARM;  Surgeon: Angelia Mould, MD;  Location: [facility] OR;  Service: Vascular;  Laterality: Left;       Family History  Problem Relation Age of Onset  . Heart disease Mother   . Lung cancer Mother   . Heart disease Father   . Malignant hyperthermia Father   . COPD Father   . Throat cancer Sister   . Esophageal cancer Sister   . Hypertension Other   . COPD Other   . Colon cancer Neg Hx   . Colon polyps Neg Hx   . Rectal cancer Neg Hx   . Stomach cancer Neg Hx     Social History   Tobacco Use  . Smoking status: Current Every Day Smoker    Packs/day: 0.50    Years: 43.00    Pack years: 21.50    Types: Cigarettes    Start date: 08/13/1973  . Smokeless tobacco: Never Used  . Tobacco comment: i dont know i just make   Substance Use Topics  . Alcohol use: Not Currently    Comment: quit drinking in 2017  . Drug use: Not Currently    Types: Marijuana, Cocaine    Comment: reports using once every 3 months, 04-06-2019 was this     Home Medications Prior to Admission medications   Medication Sig Start Date End Date Taking? Authorizing Provider  albuterol  (VENTOLIN HFA) 108 (90 Base) MCG/ACT inhaler Inhale 2 puffs into the lungs every 6 (six) hours as needed for wheezing or shortness of breath.    [provider]  budesonide-formoterol (SYMBICORT) 160-4.5 MCG/ACT inhaler Inhale 2 puffs into the lungs 2 (two) times daily.    [provider]  diltiazem (TIAZAC) 240 MG 24 hr capsule Take 240 mg by mouth daily. 08/16/19   [provider]  hydrALAZINE (APRESOLINE) 100 MG tablet Take 100 mg by mouth 2 (two) times daily.    [provider]  hydrOXYzine (ATARAX/VISTARIL) 25 MG tablet Take 25 mg by mouth 2 (two) times daily as needed for itching.  08/21/19   [provider]  lactulose (CHRONULAC) 10 GM/15ML solution Take 45 mLs (30 g total) by mouth daily as needed for mild constipation. 10/01/19   [name], MD  metoprolol tartrate (LOPRESSOR) 100 MG tablet Take 1 tablet (100 mg total) by mouth 2 (two) times daily. 06/04/19   Al Decant, MD  montelukast (SINGULAIR) 10 MG tablet Take 10 mg by mouth every evening.     [provider]  ondansetron (ZOFRAN) 4 MG tablet Take 4 mg by mouth every 4 (four) hours as needed for nausea or vomiting. 09/18/19   [provider]  Oxycodone HCl 10 MG TABS Take 10 mg by mouth 3 (three) times daily as needed (pain).  08/25/19   [provider]  pantoprazole (PROTONIX) 40 MG tablet Take 40 mg by mouth daily.    [provider]  sevelamer carbonate (RENVELA) 800 MG tablet Take 1,600-3,200 mg by mouth See admin instructions. Take 4 tablets (3200 mg) by mouth 3 times daily with meals and 2 tablets (1600 mg) with snacks    [provider]  zolpidem (AMBIEN)  10 MG tablet Take 10 mg by mouth at bedtime as needed for sleep.    [provider]    Allergies    Tramadol, Morphine, Morphine and related, Pollen extract, Acetaminophen, Aspirin, Clonidine, and Clonidine derivatives  Review of Systems   Review of Systems   Gastrointestinal: Positive for abdominal pain.  All other systems reviewed and are negative.   Physical Exam Updated Vital Signs BP (!) 146/85   Pulse 93   Temp 98 F (36.7 C) (Oral)   Resp 18   Ht 5\' 9"  (1.753 m)   Wt 63.5 kg   SpO2 94%   BMI 20.67 kg/m   Physical Exam Vitals and nursing note reviewed.  Constitutional:      Appearance: He is well-developed.  HENT:     Head: Normocephalic and atraumatic.     Mouth/Throat:     Mouth: Mucous membranes are moist.     Pharynx: Oropharynx is clear.  Eyes:     Extraocular Movements: Extraocular movements intact.     Pupils: Pupils are equal, round, and reactive to light.  Cardiovascular:     Rate and Rhythm: Normal rate and regular rhythm.  Pulmonary:     Effort: Pulmonary effort is normal.     Breath sounds: Normal breath sounds.  Abdominal:     Comments: Pt does have some ascites, but not tense.  Musculoskeletal:     Comments: R AVF good thrill  Skin:    General: Skin is warm.     Capillary Refill: Capillary refill takes less than 2 seconds.  Neurological:     General: No focal deficit present.     Mental Status: He is alert and oriented to person, place, and time.  Psychiatric:        Mood and Affect: Mood normal.        Behavior: Behavior normal.     ED Results / Procedures / Treatments   Labs (all labs ordered are listed, but only abnormal results are displayed) Labs Reviewed  CBC WITH DIFFERENTIAL/PLATELET - Abnormal; Notable for the following components:      Result Value   RBC 3.07 (*)    Hemoglobin 8.1 (*)    HCT 24.1 (*)    MCV 78.5 (*)    RDW 15.6 (*)    Basophils Absolute 0.2 (*)    All other components within normal limits  COMPREHENSIVE METABOLIC PANEL - Abnormal; Notable for the following components:   Chloride 94 (*)    BUN 23 (*)    Creatinine, Ser 4.30 (*)    Total Protein 6.3 (*)    Albumin 2.8 (*)    Alkaline Phosphatase 164 (*)    GFR calc non Af Amer 14 (*)    GFR calc Af Amer 17  (*)    Anion gap 16 (*)    All other components within normal limits  LIPASE, BLOOD  PROTIME-INR  ETHANOL  MAGNESIUM  URINALYSIS, ROUTINE W REFLEX MICROSCOPIC    EKG EKG Interpretation  Date/Time:  Saturday October 20 2019 06:51:07 EDT Ventricular Rate:  98 PR Interval:    QRS Duration: 179 QT Interval:  437 QTC Calculation: 556 R Axis:   -125 Text Interpretation: Right and left arm electrode reversal, interpretation assumes no reversal Sinus rhythm Consider left ventricular hypertrophy Prolonged QT interval No significant change since last tracing Confirmed by Isla Pence [id number]) on 10/20/2019 8:23:29 AM   Radiology DG Chest Portable 1 View  Result Date: 10/20/2019 CLINICAL DATA:  Abdominal  pain. EXAM: PORTABLE CHEST 1 VIEW COMPARISON:  Radiograph 10/04/2019 FINDINGS: Enlarged cardiac silhouette similar prior. There is new fine airspace disease in the RIGHT perihilar lung. Mild airspace disease in the LEFT lung. No focal consolidation. No pneumothorax. IMPRESSION: Cardiomegaly and fine perihilar airspace suggest pulmonary edema. Pneumonia less favored. Airspace disease is asymmetric, greater on the RIGHT. Electronically Signed   By: Suzy Bouchard M.D.   On: 10/20/2019 07:57    Procedures Procedures (including critical care time)  Medications Ordered in ED Medications  HYDROmorphone (DILAUDID) injection 1 mg (has no administration in time range)  HYDROmorphone (DILAUDID) injection 1 mg (1 mg Intravenous Given 10/20/19 0735)    ED Course  I have reviewed the triage vital signs and the nursing notes.  Pertinent labs & imaging results that were available during my care of the patient were reviewed by me and considered in my medical decision making (see chart for details).    MDM Rules/Calculators/A&P                      Pt's labs are stable.  I doubt SBP as he is afebrile and WBC is not elevated and pain is chronic.  Pt encouraged to f/u with Dr. Hilarie Fredrickson whom he's seen  in the past for his liver and cirrhosis.  Pt knows to return if worse.   Final Clinical Impression(s) / ED Diagnoses Final diagnoses:  Generalized abdominal pain  ESRD on hemodialysis ([place])  Cirrhosis of liver with ascites, unspecified hepatic cirrhosis type [facility])    Rx / DC Orders ED Discharge Orders    None       Isla Pence, MD 10/20/19 [phone]

## 2019-10-20 NOTE — ED Triage Notes (Signed)
Pt arrived from home via EMS c/o abd pain r/t liver. 15/10 pain level. Pt is a hemodialysis pt MWF and went Friday. Pt refusing gown and immediatly asked for dilaudid by name once in room.

## 2019-10-20 NOTE — ED Notes (Signed)
Patient verbalizes understanding of discharge instructions. Opportunity for questioning and answers were provided. Pt discharged from ED. 

## 2019-10-20 NOTE — ED Notes (Signed)
Pt does not produce urine

## 2019-10-21 ENCOUNTER — Other Ambulatory Visit: Payer: Self-pay

## 2019-10-21 ENCOUNTER — Emergency Department (HOSPITAL_COMMUNITY): Payer: Medicare Other

## 2019-10-21 ENCOUNTER — Observation Stay (HOSPITAL_COMMUNITY): Payer: Medicare Other

## 2019-10-21 ENCOUNTER — Inpatient Hospital Stay (HOSPITAL_COMMUNITY)
Admission: EM | Admit: 2019-10-21 | Discharge: 2019-10-23 | DRG: 432 | Disposition: A | Discharge status: Home / Self Care | Payer: Medicare Other | Attending: Family Medicine | Admitting: Family Medicine

## 2019-10-21 DIAGNOSIS — Z7189 Other specified counseling: Secondary | ICD-10-CM | POA: Diagnosis not present

## 2019-10-21 DIAGNOSIS — G894 Chronic pain syndrome: Secondary | ICD-10-CM | POA: Diagnosis present

## 2019-10-21 DIAGNOSIS — Z515 Encounter for palliative care: Secondary | ICD-10-CM

## 2019-10-21 DIAGNOSIS — Z801 Family history of malignant neoplasm of trachea, bronchus and lung: Secondary | ICD-10-CM

## 2019-10-21 DIAGNOSIS — B182 Chronic viral hepatitis C: Secondary | ICD-10-CM | POA: Diagnosis present

## 2019-10-21 DIAGNOSIS — I4891 Unspecified atrial fibrillation: Secondary | ICD-10-CM | POA: Diagnosis present

## 2019-10-21 DIAGNOSIS — I700 Atherosclerosis of aorta: Secondary | ICD-10-CM | POA: Diagnosis present

## 2019-10-21 DIAGNOSIS — R1084 Generalized abdominal pain: Secondary | ICD-10-CM | POA: Diagnosis not present

## 2019-10-21 DIAGNOSIS — R188 Other ascites: Secondary | ICD-10-CM | POA: Diagnosis present

## 2019-10-21 DIAGNOSIS — I1311 Hypertensive heart and chronic kidney disease without heart failure, with stage 5 chronic kidney disease, or end stage renal disease: Secondary | ICD-10-CM | POA: Diagnosis present

## 2019-10-21 DIAGNOSIS — J449 Chronic obstructive pulmonary disease, unspecified: Secondary | ICD-10-CM | POA: Diagnosis not present

## 2019-10-21 DIAGNOSIS — N2581 Secondary hyperparathyroidism of renal origin: Secondary | ICD-10-CM | POA: Diagnosis present

## 2019-10-21 DIAGNOSIS — Z992 Dependence on renal dialysis: Secondary | ICD-10-CM

## 2019-10-21 DIAGNOSIS — N186 End stage renal disease: Secondary | ICD-10-CM

## 2019-10-21 DIAGNOSIS — Z79899 Other long term (current) drug therapy: Secondary | ICD-10-CM

## 2019-10-21 DIAGNOSIS — Z888 Allergy status to other drugs, medicaments and biological substances status: Secondary | ICD-10-CM

## 2019-10-21 DIAGNOSIS — Z8249 Family history of ischemic heart disease and other diseases of the circulatory system: Secondary | ICD-10-CM

## 2019-10-21 DIAGNOSIS — M898X9 Other specified disorders of bone, unspecified site: Secondary | ICD-10-CM | POA: Diagnosis present

## 2019-10-21 DIAGNOSIS — I482 Chronic atrial fibrillation, unspecified: Secondary | ICD-10-CM | POA: Diagnosis present

## 2019-10-21 DIAGNOSIS — Z955 Presence of coronary angioplasty implant and graft: Secondary | ICD-10-CM

## 2019-10-21 DIAGNOSIS — Z66 Do not resuscitate: Secondary | ICD-10-CM | POA: Diagnosis present

## 2019-10-21 DIAGNOSIS — Z886 Allergy status to analgesic agent status: Secondary | ICD-10-CM

## 2019-10-21 DIAGNOSIS — Z885 Allergy status to narcotic agent status: Secondary | ICD-10-CM

## 2019-10-21 DIAGNOSIS — K7469 Other cirrhosis of liver: Principal | ICD-10-CM | POA: Diagnosis present

## 2019-10-21 DIAGNOSIS — Z7951 Long term (current) use of inhaled steroids: Secondary | ICD-10-CM

## 2019-10-21 DIAGNOSIS — B181 Chronic viral hepatitis B without delta-agent: Secondary | ICD-10-CM | POA: Diagnosis present

## 2019-10-21 DIAGNOSIS — Z20822 Contact with and (suspected) exposure to covid-19: Secondary | ICD-10-CM | POA: Diagnosis present

## 2019-10-21 DIAGNOSIS — I480 Paroxysmal atrial fibrillation: Secondary | ICD-10-CM | POA: Diagnosis present

## 2019-10-21 DIAGNOSIS — I252 Old myocardial infarction: Secondary | ICD-10-CM

## 2019-10-21 DIAGNOSIS — M19012 Primary osteoarthritis, left shoulder: Secondary | ICD-10-CM | POA: Diagnosis present

## 2019-10-21 DIAGNOSIS — K219 Gastro-esophageal reflux disease without esophagitis: Secondary | ICD-10-CM | POA: Diagnosis present

## 2019-10-21 DIAGNOSIS — D631 Anemia in chronic kidney disease: Secondary | ICD-10-CM | POA: Diagnosis present

## 2019-10-21 DIAGNOSIS — E8770 Fluid overload, unspecified: Secondary | ICD-10-CM | POA: Diagnosis present

## 2019-10-21 DIAGNOSIS — Z800 Family history of malignant neoplasm of digestive organs: Secondary | ICD-10-CM

## 2019-10-21 DIAGNOSIS — I447 Left bundle-branch block, unspecified: Secondary | ICD-10-CM | POA: Diagnosis present

## 2019-10-21 DIAGNOSIS — Z8616 Personal history of COVID-19: Secondary | ICD-10-CM

## 2019-10-21 DIAGNOSIS — F17210 Nicotine dependence, cigarettes, uncomplicated: Secondary | ICD-10-CM | POA: Diagnosis present

## 2019-10-21 DIAGNOSIS — I10 Essential (primary) hypertension: Secondary | ICD-10-CM | POA: Diagnosis present

## 2019-10-21 DIAGNOSIS — I2510 Atherosclerotic heart disease of native coronary artery without angina pectoris: Secondary | ICD-10-CM | POA: Diagnosis present

## 2019-10-21 DIAGNOSIS — I959 Hypotension, unspecified: Secondary | ICD-10-CM | POA: Diagnosis not present

## 2019-10-21 DIAGNOSIS — R109 Unspecified abdominal pain: Secondary | ICD-10-CM | POA: Diagnosis present

## 2019-10-21 LAB — GRAM STAIN

## 2019-10-21 LAB — CBC WITH DIFFERENTIAL/PLATELET
Abs Immature Granulocytes: 0.03 10*3/uL (ref 0.00–0.07)
Basophils Absolute: 0.2 10*3/uL — ABNORMAL HIGH (ref 0.0–0.1)
Basophils Relative: 2 %
Eosinophils Absolute: 0.2 10*3/uL (ref 0.0–0.5)
Eosinophils Relative: 3 %
HCT: 22.4 % — ABNORMAL LOW (ref 39.0–52.0)
Hemoglobin: 8.1 g/dL — ABNORMAL LOW (ref 13.0–17.0)
Immature Granulocytes: 0 %
Lymphocytes Relative: 18 %
Lymphs Abs: 1.5 10*3/uL (ref 0.7–4.0)
MCH: 29.9 pg (ref 26.0–34.0)
MCHC: 36.2 g/dL — ABNORMAL HIGH (ref 30.0–36.0)
MCV: 82.7 fL (ref 80.0–100.0)
Monocytes Absolute: 0.6 10*3/uL (ref 0.1–1.0)
Monocytes Relative: 7 %
Neutro Abs: 5.8 10*3/uL (ref 1.7–7.7)
Neutrophils Relative %: 70 %
Platelets: 198 10*3/uL (ref 150–400)
RBC: 2.71 MIL/uL — ABNORMAL LOW (ref 4.22–5.81)
RDW: 16 % — ABNORMAL HIGH (ref 11.5–15.5)
WBC: 8.3 10*3/uL (ref 4.0–10.5)
nRBC: 0 % (ref 0.0–0.2)

## 2019-10-21 LAB — COMPREHENSIVE METABOLIC PANEL
ALT: 17 U/L (ref 0–44)
AST: 26 U/L (ref 15–41)
Albumin: 2.7 g/dL — ABNORMAL LOW (ref 3.5–5.0)
Alkaline Phosphatase: 158 U/L — ABNORMAL HIGH (ref 38–126)
Anion gap: 18 — ABNORMAL HIGH (ref 5–15)
BUN: 33 mg/dL — ABNORMAL HIGH (ref 6–20)
CO2: 25 mmol/L (ref 22–32)
Calcium: 9.5 mg/dL (ref 8.9–10.3)
Chloride: 95 mmol/L — ABNORMAL LOW (ref 98–111)
Creatinine, Ser: 5.3 mg/dL — ABNORMAL HIGH (ref 0.61–1.24)
GFR calc Af Amer: 13 mL/min — ABNORMAL LOW (ref >60)
GFR calc non Af Amer: 11 mL/min — ABNORMAL LOW (ref >60)
Glucose, Bld: 92 mg/dL (ref 70–99)
Potassium: 4.6 mmol/L (ref 3.5–5.1)
Sodium: 138 mmol/L (ref 135–145)
Total Bilirubin: 1.1 mg/dL (ref 0.3–1.2)
Total Protein: 6.4 g/dL — ABNORMAL LOW (ref 6.5–8.1)

## 2019-10-21 LAB — AMYLASE, PLEURAL OR PERITONEAL FLUID: Amylase, Fluid: 27 U/L

## 2019-10-21 LAB — SARS CORONAVIRUS 2 (TAT 6-24 HRS): SARS Coronavirus 2: NEGATIVE

## 2019-10-21 LAB — ALBUMIN, PLEURAL OR PERITONEAL FLUID: Albumin, Fluid: 1.3 g/dL

## 2019-10-21 LAB — LIPASE, BLOOD: Lipase: 19 U/L (ref 11–51)

## 2019-10-21 LAB — BODY FLUID CELL COUNT WITH DIFFERENTIAL
Eos, Fluid: 2 %
Lymphs, Fluid: 19 %
Monocyte-Macrophage-Serous Fluid: 76 % (ref 50–90)
Neutrophil Count, Fluid: 2 % (ref 0–25)
Other Cells, Fluid: 1 %
Total Nucleated Cell Count, Fluid: 187 cu mm (ref 0–1000)

## 2019-10-21 LAB — LACTATE DEHYDROGENASE, PLEURAL OR PERITONEAL FLUID: LD, Fluid: 76 U/L — ABNORMAL HIGH (ref 3–23)

## 2019-10-21 LAB — PROTEIN, PLEURAL OR PERITONEAL FLUID: Total protein, fluid: <3 g/dL

## 2019-10-21 LAB — MAGNESIUM: Magnesium: 2.1 mg/dL (ref 1.7–2.4)

## 2019-10-21 LAB — LACTATE DEHYDROGENASE: LDH: 166 U/L (ref 98–192)

## 2019-10-21 MED ORDER — LIDOCAINE HCL (PF) 1 % IJ SOLN
INTRAMUSCULAR | Status: AC
  Administered 2019-10-21: 30 mL
  Filled 2019-10-21: qty 30

## 2019-10-21 MED ORDER — CALCITRIOL 0.5 MCG PO CAPS
1.7500 ug | ORAL_CAPSULE | ORAL | Status: DC
  Administered 2019-10-22: 1.75 ug via ORAL

## 2019-10-21 MED ORDER — DIMENHYDRINATE 50 MG PO TABS
25.0000 mg | ORAL_TABLET | Freq: Once | ORAL | Status: DC
  Filled 2019-10-21: qty 1

## 2019-10-21 MED ORDER — ONDANSETRON HCL 4 MG/2ML IJ SOLN
4.0000 mg | Freq: Once | INTRAMUSCULAR | Status: AC
  Administered 2019-10-21: 4 mg via INTRAVENOUS
  Filled 2019-10-21: qty 2

## 2019-10-21 MED ORDER — DARBEPOETIN ALFA 100 MCG/0.5ML IJ SOSY
100.0000 ug | PREFILLED_SYRINGE | INTRAMUSCULAR | Status: DC
  Filled 2019-10-21: qty 0.5

## 2019-10-21 MED ORDER — METOPROLOL TARTRATE 5 MG/5ML IV SOLN
5.0000 mg | INTRAVENOUS | Status: DC | PRN
  Administered 2019-10-22: 5 mg via INTRAVENOUS
  Filled 2019-10-21 (×2): qty 5

## 2019-10-21 MED ORDER — NICOTINE 14 MG/24HR TD PT24
14.0000 mg | MEDICATED_PATCH | Freq: Every day | TRANSDERMAL | Status: DC
  Filled 2019-10-21 (×2): qty 1

## 2019-10-21 MED ORDER — HYDROMORPHONE HCL 1 MG/ML IJ SOLN
1.0000 mg | Freq: Once | INTRAMUSCULAR | Status: AC
  Administered 2019-10-21: 1 mg via INTRAVENOUS
  Filled 2019-10-21: qty 1

## 2019-10-21 MED ORDER — SODIUM CHLORIDE 0.9 % IV SOLN: 62.5000 mg | INTRAVENOUS | Status: DC

## 2019-10-21 MED ORDER — PROMETHAZINE HCL 25 MG/ML IJ SOLN
25.0000 mg | Freq: Once | INTRAMUSCULAR | Status: AC
  Administered 2019-10-21: 25 mg via INTRAVENOUS
  Filled 2019-10-21: qty 1

## 2019-10-21 MED ORDER — METOCLOPRAMIDE HCL 5 MG/ML IJ SOLN
5.0000 mg | Freq: Once | INTRAMUSCULAR | Status: AC
  Administered 2019-10-21: 5 mg via INTRAVENOUS
  Filled 2019-10-21: qty 2

## 2019-10-21 MED ORDER — HYDROMORPHONE HCL 1 MG/ML IJ SOLN
0.5000 mg | INTRAMUSCULAR | Status: DC | PRN
  Administered 2019-10-21 – 2019-10-23 (×9): 0.5 mg via INTRAVENOUS
  Filled 2019-10-21 (×10): qty 1

## 2019-10-21 MED ORDER — ONDANSETRON HCL 4 MG/2ML IJ SOLN: 4.0000 mg | Freq: Once | INTRAMUSCULAR | Status: DC

## 2019-10-21 MED ORDER — HYDRALAZINE HCL 20 MG/ML IJ SOLN: 5.0000 mg | INTRAMUSCULAR | Status: DC | PRN

## 2019-10-21 NOTE — Progress Notes (Signed)
Patient ID: [name], male   DOB: April 06, 1961, 59 y.o.   MRN: [id number] Pt.  Was admitted this morning. [name] is a 59 y.o. male with medical history significant of ESRD on HD MWF, liver cirrhosis with recurrent ascites requiring serial paracentesis, chronic hep B, hep C, paroxysmal A. fib not on anticoagulation, hypertension, chronic pain syndrome, CAD status post PCI, COPD, hypertension, history of cocaine/marijuana abuse presenting with complaints of abdominal pain.  Patient was seen in the ED yesterday for the same complaint and his abdominal pain was felt to be related to mild ascites.  He was discharged with plan to follow-up with GI. Chart reviewed.  Spoke to patient.  He is status post paracentesis.  He is complaining of nausea and abdominal pain post paracentesis.  Nephrology was consulted as his HD is on mwf. Will continue to manage sx. S/p 2L of amber colored fluid. Labs pending. Likely HD in am

## 2019-10-21 NOTE — H&P (Signed)
History and Physical    [name] FGH:[id number] DOB: April 02, 1961 DOA: 10/21/2019  PCP: [name], FNP Patient coming from: Home  Chief Complaint: Abdominal pain  HPI: [name] is a 59 y.o. male with medical history significant of ESRD on HD MWF, liver cirrhosis with recurrent ascites requiring serial paracentesis, chronic hep B, hep C, paroxysmal A. fib not on anticoagulation, hypertension, chronic pain syndrome, CAD status post PCI, COPD, hypertension, history of cocaine/marijuana abuse presenting with complaints of abdominal pain.  Patient was seen in the ED yesterday for the same complaint and his abdominal pain was felt to be related to mild ascites.  He was discharged with plan to follow-up with GI.  Patient reports 3 month history of generalized abdominal pain.  Denies nausea, vomiting, or diarrhea.  Denies fevers or chills.  Reports having chronic shortness of breath which he thinks is due to his liver cirrhosis, no recent change.  Denies cough or chest pain.  He is requesting a pain medication.  ED Course: Afebrile.  Slightly tachycardic on arrival.  Not hypotensive.  Labs showing no leukocytosis.  Hemoglobin 8.1, at baseline.  Lipase normal.  Alk phos 158, remainder of LFTs normal.  SARS-CoV-2 PCR test pending. Chest x-ray showing slight improvement in pulmonary edema pattern.  Stable cardiomegaly. Patient received Dilaudid and Reglan.  Review of Systems:  All systems reviewed and apart from history of presenting illness, are negative.  Past Medical History:  Diagnosis Date  . Anemia   . Anxiety   . Arthritis    left shoulder  . Atherosclerosis of aorta ([place])   . Cardiomegaly   . Chest pain    DATE UNKNOWN, C/O PERIODICALLY  . Cocaine abuse ([place])   . COPD exacerbation ([place]) 08/17/2016  . Coronary artery disease    stent 02/22/17  . ESRD (end stage renal disease) on dialysis ([place])    "[name]; MWF" (07/04/2017)  . GERD (gastroesophageal  reflux disease)    DATE UNKNOWN  . Hemorrhoids   . Hepatitis B, chronic ([place])   . Hepatitis C   . History of kidney stones   . Hyperkalemia   . Hypertension   . Kidney failure   . Metabolic bone disease    Patient denies  . Mitral stenosis   . Myocardial infarction ([place])   . Pneumonia   . Pulmonary edema   . Solitary rectal ulcer syndrome 07/2017   at flex sig for rectal bleeding  . Tubular adenoma of colon     Past Surgical History:  Procedure Laterality Date  . A/V FISTULAGRAM Left 05/26/2017   Procedure: A/V FISTULAGRAM;  Surgeon: Conrad Hermann, MD;  Location: [place] [facility];  Service: Cardiovascular;  Laterality: Left;  . A/V FISTULAGRAM Right 11/18/2017   Procedure: A/V FISTULAGRAM - Right Arm;  Surgeon: Elam Dutch, MD;  Location: [place] [facility];  Service: Cardiovascular;  Laterality: Right;  . APPLICATION OF WOUND VAC Left 06/14/2017   Procedure: APPLICATION OF WOUND VAC;  Surgeon: Katha Cabal, MD;  Location: [facility];  Service: Vascular;  Laterality: Left;  . AV FISTULA PLACEMENT  2012   BELIEVED WAS PLACED IN JUNE  . AV FISTULA PLACEMENT Right 08/09/2017   Procedure: Creation Right arm ARTERIOVENOUS BRACHIOCEPOHALIC FISTULA;  Surgeon: Elam Dutch, MD;  Location: [facility];  Service: Vascular;  Laterality: Right;  . AV FISTULA PLACEMENT Right 11/22/2017   Procedure: INSERTION OF ARTERIOVENOUS (AV) GORE-TEX GRAFT RIGHT UPPER ARM;  Surgeon:  Elam Dutch, MD;  Location: [facility];  Service: Vascular;  Laterality: Right;  . BIOPSY  01/25/2018   Procedure: BIOPSY;  Surgeon: Jerene Bears, MD;  Location: [place];  Service: Gastroenterology;;  . BIOPSY  04/10/2019   Procedure: BIOPSY;  Surgeon: Jerene Bears, MD;  Location: [facility];  Service: Gastroenterology;;  . COLONOSCOPY    . COLONOSCOPY WITH PROPOFOL N/A 01/25/2018   Procedure: COLONOSCOPY WITH PROPOFOL;  Surgeon: Jerene Bears, MD;  Location: [place];  Service: Gastroenterology;   Laterality: N/A;  . CORONARY STENT INTERVENTION N/A 02/22/2017   Procedure: CORONARY STENT INTERVENTION;  Surgeon: Nigel Mormon, MD;  Location: [place] [facility];  Service: Cardiovascular;  Laterality: N/A;  . ESOPHAGOGASTRODUODENOSCOPY (EGD) WITH PROPOFOL N/A 01/25/2018   Procedure: ESOPHAGOGASTRODUODENOSCOPY (EGD) WITH PROPOFOL;  Surgeon: Jerene Bears, MD;  Location: [place];  Service: Gastroenterology;  Laterality: N/A;  . ESOPHAGOGASTRODUODENOSCOPY (EGD) WITH PROPOFOL N/A 04/10/2019   Procedure: ESOPHAGOGASTRODUODENOSCOPY (EGD) WITH PROPOFOL;  Surgeon: Jerene Bears, MD;  Location: [facility];  Service: Gastroenterology;  Laterality: N/A;  . FLEXIBLE SIGMOIDOSCOPY N/A 07/15/2017   Procedure: FLEXIBLE SIGMOIDOSCOPY;  Surgeon: Carol Ada, MD;  Location: [place];  Service: Endoscopy;  Laterality: N/A;  . HEMORRHOID BANDING    . I & D EXTREMITY Left 06/01/2017   Procedure: IRRIGATION AND DEBRIDEMENT LEFT ARM HEMATOMA WITH LIGATION OF LEFT ARM AV FISTULA;  Surgeon: Elam Dutch, MD;  Location: [facility];  Service: Vascular;  Laterality: Left;  . I & D EXTREMITY Left 06/14/2017   Procedure: IRRIGATION AND DEBRIDEMENT EXTREMITY;  Surgeon: Katha Cabal, MD;  Location: [facility];  Service: Vascular;  Laterality: Left;  . INSERTION OF DIALYSIS CATHETER  05/30/2017  . INSERTION OF DIALYSIS CATHETER N/A 05/30/2017   Procedure: INSERTION OF DIALYSIS CATHETER;  Surgeon: Elam Dutch, MD;  Location: [place];  Service: Vascular;  Laterality: N/A;  . IR PARACENTESIS  08/30/2017  . IR PARACENTESIS  09/29/2017  . IR PARACENTESIS  10/28/2017  . IR PARACENTESIS  11/09/2017  . IR PARACENTESIS  11/16/2017  . IR PARACENTESIS  11/28/2017  . IR PARACENTESIS  12/01/2017  . IR PARACENTESIS  12/06/2017  . IR PARACENTESIS  01/03/2018  . IR PARACENTESIS  01/23/2018  . IR PARACENTESIS  02/07/2018  . IR PARACENTESIS  02/21/2018  . IR PARACENTESIS  03/06/2018  . IR PARACENTESIS  03/17/2018  . IR  PARACENTESIS  04/04/2018  . IR PARACENTESIS  12/28/2018  . IR PARACENTESIS  01/08/2019  . IR PARACENTESIS  01/23/2019  . IR PARACENTESIS  02/01/2019  . IR PARACENTESIS  02/19/2019  . IR PARACENTESIS  03/01/2019  . IR PARACENTESIS  03/15/2019  . IR PARACENTESIS  04/03/2019  . IR PARACENTESIS  04/12/2019  . IR PARACENTESIS  05/01/2019  . IR PARACENTESIS  05/08/2019  . IR PARACENTESIS  05/24/2019  . IR PARACENTESIS  06/12/2019  . IR PARACENTESIS  07/09/2019  . IR PARACENTESIS  07/27/2019  . IR PARACENTESIS  08/09/2019  . IR PARACENTESIS  08/21/2019  . IR PARACENTESIS  09/17/2019  . IR PARACENTESIS  10/05/2019  . IR RADIOLOGIST EVAL & MGMT  02/14/2018  . IR RADIOLOGIST EVAL & MGMT  02/22/2019  . LEFT HEART CATH AND CORONARY ANGIOGRAPHY N/A 02/22/2017   Procedure: LEFT HEART CATH AND CORONARY ANGIOGRAPHY;  Surgeon: Nigel Mormon, MD;  Location: [place] [facility];  Service: Cardiovascular;  Laterality: N/A;  . LIGATION OF ARTERIOVENOUS  FISTULA Left 12/14/2015   Procedure:  Plication of Left Arm Arteriovenous Fistula;  Surgeon: Elam Dutch, MD;  Location: [facility];  Service: Vascular;  Laterality: Left;  . POLYPECTOMY    . POLYPECTOMY  01/25/2018   Procedure: POLYPECTOMY;  Surgeon: Jerene Bears, MD;  Location: [place];  Service: Gastroenterology;;  . REVISON OF ARTERIOVENOUS FISTULA Left 4/40/1027   Procedure: PLICATION OF DISTAL ANEURYSMAL SEGEMENT OF LEFT UPPER ARM ARTERIOVENOUS FISTULA;  Surgeon: Elam Dutch, MD;  Location: [place];  Service: Vascular;  Laterality: Left;  . REVISON OF ARTERIOVENOUS FISTULA Left 2/53/6644   Procedure: Plication of Left Upper Arm Fistula ;  Surgeon: Waynetta Sandy, MD;  Location: [place];  Service: Vascular;  Laterality: Left;  . SKIN GRAFT SPLIT THICKNESS LEG / FOOT Left    SKIN GRAFT SPLIT THICKNESS LEFT ARM DONOR SITE: LEFT ANTERIOR THIGH  . SKIN SPLIT GRAFT Left 07/04/2017   Procedure: SKIN GRAFT SPLIT THICKNESS LEFT ARM DONOR SITE: LEFT ANTERIOR  THIGH;  Surgeon: Elam Dutch, MD;  Location: [place];  Service: Vascular;  Laterality: Left;  . THROMBECTOMY W/ EMBOLECTOMY Left 06/05/2017   Procedure: EXPLORATION OF LEFT ARM FOR BLEEDING; OVERSEWED PROXIMAL FISTULA;  Surgeon: Angelia Mould, MD;  Location: [place];  Service: Vascular;  Laterality: Left;  . WOUND EXPLORATION Left 06/03/2017   Procedure: WOUND EXPLORATION WITH WOUND VAC APPLICATION TO LEFT ARM;  Surgeon: Angelia Mould, MD;  Location: [place];  Service: Vascular;  Laterality: Left;     reports that he has been smoking cigarettes. He started smoking about 46 years ago. He has a 21.50 pack-year smoking history. He has never used smokeless tobacco. He reports previous alcohol use. He reports previous drug use. Drugs: Marijuana and Cocaine.  Allergies  Allergen Reactions  . Tramadol Itching and Other (See Comments)  . Morphine     Other reaction(s): Other (See Comments)  . Morphine And Related Other (See Comments)    Stomach pain  . Pollen Extract     Other reaction(s): Sneezing (finding)  . Acetaminophen Nausea Only    Stomach ache Other reaction(s): Other (See Comments) Stomach ache  . Aspirin Other (See Comments) and Itching    STOMACH PAIN Other reaction(s): Unknown STOMACH PAIN  . Clonidine Itching  . Clonidine Derivatives Itching    Family History  Problem Relation Age of Onset  . Heart disease Mother   . Lung cancer Mother   . Heart disease Father   . Malignant hyperthermia Father   . COPD Father   . Throat cancer Sister   . Esophageal cancer Sister   . Hypertension Other   . COPD Other   . Colon cancer Neg Hx   . Colon polyps Neg Hx   . Rectal cancer Neg Hx   . Stomach cancer Neg Hx     Prior to Admission medications   Medication Sig Start Date End Date Taking? Authorizing Provider  albuterol (VENTOLIN HFA) 108 (90 Base) MCG/ACT inhaler Inhale 2 puffs into the lungs every 6 (six) hours as needed for wheezing or shortness of  breath.    [provider]  budesonide-formoterol (SYMBICORT) 160-4.5 MCG/ACT inhaler Inhale 2 puffs into the lungs 2 (two) times daily.    [provider]  diltiazem (TIAZAC) 240 MG 24 hr capsule Take 240 mg by mouth daily. 08/16/19   [provider]  hydrALAZINE (APRESOLINE) 100 MG tablet Take 100 mg by mouth 2 (two) times daily.    [provider]  hydrOXYzine (ATARAX/VISTARIL) 25  MG tablet Take 25 mg by mouth 2 (two) times daily as needed for itching.  08/21/19   [provider]  lactulose (CHRONULAC) 10 GM/15ML solution Take 45 mLs (30 g total) by mouth daily as needed for mild constipation. 10/01/19   [name], MD  metoprolol tartrate (LOPRESSOR) 100 MG tablet Take 1 tablet (100 mg total) by mouth 2 (two) times daily. 06/04/19   Al Decant, MD  montelukast (SINGULAIR) 10 MG tablet Take 10 mg by mouth every evening.     [provider]  ondansetron (ZOFRAN) 4 MG tablet Take 4 mg by mouth every 4 (four) hours as needed for nausea or vomiting. 09/18/19   [provider]  Oxycodone HCl 10 MG TABS Take 10 mg by mouth 3 (three) times daily as needed (pain).  08/25/19   [provider]  pantoprazole (PROTONIX) 40 MG tablet Take 40 mg by mouth daily.    [provider]  sevelamer carbonate (RENVELA) 800 MG tablet Take 1,600-3,200 mg by mouth See admin instructions. Take 4 tablets (3200 mg) by mouth 3 times daily with meals and 2 tablets (1600 mg) with snacks    [provider]  zolpidem (AMBIEN) 10 MG tablet Take 10 mg by mouth at bedtime as needed for sleep.    [provider]    Physical Exam: Vitals:   10/21/19 0430 10/21/19 0445 10/21/19 0515 10/21/19 0530  BP: (!) 152/83 (!) 159/83 133/87 (!) 143/96  Pulse:      Resp: 15 14 14 15   Temp:      TempSrc:      SpO2:        Physical Exam  Constitutional: He is oriented to person, place, and time. He appears well-developed and  well-nourished. No distress.  HENT:  Head: Normocephalic.  Eyes: Right eye exhibits no discharge. Left eye exhibits no discharge.  Cardiovascular: Normal rate, regular rhythm and intact distal pulses.  Pulmonary/Chest: Breath sounds normal. He has no wheezes. He has no rales.  Abdominal: Soft. Bowel sounds are normal. There is no abdominal tenderness. There is no guarding.  Tense ascites present  Musculoskeletal:        General: No edema.     Cervical back: Neck supple.  Neurological: He is alert and oriented to person, place, and time.  Skin: Skin is warm and dry. He is not diaphoretic.     Labs on Admission: I have personally reviewed following labs and imaging studies  CBC: Recent Labs  Lab 10/20/19 0743 10/21/19 0314  WBC 8.1 8.3  NEUTROABS 5.8 5.8  HGB 8.1* 8.1*  HCT 24.1* 22.4*  MCV 78.5* 82.7  PLT 202 945   Basic Metabolic Panel: Recent Labs  Lab 10/16/19 1247 10/20/19 0743 10/21/19 0314  NA 137 138 138  K 4.8 4.8 4.6  CL 94* 94* 95*  CO2 31 28 25   GLUCOSE 89 98 92  BUN 20 23* 33*  CREATININE 4.42* 4.30* 5.30*  CALCIUM 9.3 9.4 9.5  MG  --  1.9  --    GFR: Estimated Creatinine Clearance: 13.8 mL/min (A) (by C-G formula based on SCr of 5.3 mg/dL (H)). Liver Function Tests: Recent Labs  Lab 10/20/19 0743 10/21/19 0314  AST 29 26  ALT 18 17  ALKPHOS 164* 158*  BILITOT 0.9 1.1  PROT 6.3* 6.4*  ALBUMIN 2.8* 2.7*   Recent Labs  Lab 10/20/19 0743 10/21/19 0314  LIPASE 22 19   No results for input(s): AMMONIA in the last  168 hours. Coagulation Profile: Recent Labs  Lab 10/20/19 0743  INR 1.2   Cardiac Enzymes: No results for input(s): CKTOTAL, CKMB, CKMBINDEX, TROPONINI in the last 168 hours. BNP (last 3 results) No results for input(s): PROBNP in the last 8760 hours. HbA1C: No results for input(s): HGBA1C in the last 72 hours. CBG: No results for input(s): GLUCAP in the last 168 hours. Lipid Profile: No results for input(s): CHOL, HDL,  LDLCALC, TRIG, CHOLHDL, LDLDIRECT in the last 72 hours. Thyroid Function Tests: No results for input(s): TSH, T4TOTAL, FREET4, T3FREE, THYROIDAB in the last 72 hours. Anemia Panel: No results for input(s): VITAMINB12, FOLATE, FERRITIN, TIBC, IRON, RETICCTPCT in the last 72 hours. Urine analysis:    Component Value Date/Time   COLORURINE YELLOW 07/16/2011 1619   APPEARANCEUR CLEAR 07/16/2011 1619   LABSPEC 1.018 07/16/2011 1619   PHURINE 7.5 07/16/2011 1619   GLUCOSEU 250 (A) 07/16/2011 1619   HGBUR MODERATE (A) 07/16/2011 1619   BILIRUBINUR SMALL (A) 07/16/2011 1619   KETONESUR NEGATIVE 07/16/2011 1619   PROTEINUR >300 (A) 07/16/2011 1619   UROBILINOGEN 1.0 07/16/2011 1619   NITRITE NEGATIVE 07/16/2011 1619   LEUKOCYTESUR SMALL (A) 07/16/2011 1619    Radiological Exams on Admission: DG Chest Portable 1 View  Result Date: 10/21/2019 CLINICAL DATA:  Abdominal painsob EXAM: PORTABLE CHEST 1 VIEW COMPARISON:  Four hundred ten 21 FINDINGS: Stable enlarged cardiac silhouette. Perihilar airspace disease slightly improved. Elevation LEFT hemidiaphragm. No pleural fluid no pneumothorax. Vascular calcification in the RIGHT subclavian artery. IMPRESSION: Slight improvement in pulmonary edema pattern. Stable cardiomegaly Electronically Signed   By: Suzy Bouchard M.D.   On: 10/21/2019 04:06   DG Chest Portable 1 View  Result Date: 10/20/2019 CLINICAL DATA:  Abdominal pain. EXAM: PORTABLE CHEST 1 VIEW COMPARISON:  Radiograph 10/04/2019 FINDINGS: Enlarged cardiac silhouette similar prior. There is new fine airspace disease in the RIGHT perihilar lung. Mild airspace disease in the LEFT lung. No focal consolidation. No pneumothorax. IMPRESSION: Cardiomegaly and fine perihilar airspace suggest pulmonary edema. Pneumonia less favored. Airspace disease is asymmetric, greater on the RIGHT. Electronically Signed   By: Suzy Bouchard M.D.   On: 10/20/2019 07:57    EKG: Independently reviewed.  A. fib  (heart rate 113), LBBB.  QTc 622.  Appears similar to prior tracing except QT interval now worsened.  Assessment/Plan Principal Problem:   Abdominal pain Active Problems:   COPD GOLD 0 with flattening on inps f/v    Essential hypertension   Ascites   A-fib (HCC)   Abdominal pain/recurrent ascites: Suspect patient's abdominal pain is related to ascites.  No complains of vomiting or diarrhea.  Lipase normal.  Alk phos elevated in setting of ESRD.  Transaminases and T bili normal.  SBP less likely given no fever or leukocytosis and abdominal pain appears to be chronic. -IR paracentesis order placed.  Keep n.p.o.  Pain management.  ESRD on HD MWF/ mild volume overload: Patient states his last dialysis session was 4/9.  Chest x-ray showing slight improvement in pulmonary edema pattern.  Lungs clear on exam.  Consult nephrology in a.m.  Paroxysmal A. Fib: Currently in A. fib with heart rate in the low 100s to 110s.  Order metoprolol PRN HR >120.   Liver cirrhosis/hep C: Management of ascites as mentioned above.  No signs of encephalopathy at this time.  Hypertension: Stable.  Systolic currently in the 140s.  Resume home meds after pharmacy med rec is done.  COPD: Stable.  No cough or wheezing.  Resume home inhalers after pharmacy med rec is done.  QT prolongation EKG: Continue cardiac monitoring.  Keep potassium above 4 and magnesium above 2.  Avoid QT prolonging drugs if possible.  Repeat EKG in a.m.  Pharmacy med rec pending.  DVT prophylaxis: SCDs at this time Code Status: Patient wishes to be DNR. Family Communication: No family at bedside. Disposition Plan: Anticipate discharge in 1 to 2 days. Admission status: It is my clinical opinion that referral for OBSERVATION is reasonable and necessary in this patient based on the above information provided. The aforementioned taken together are felt to place the patient at high risk for further clinical deterioration. However it is anticipated  that the patient may be medically stable for discharge from the hospital within 24 to 48 hours.  The medical decision making on this patient was of high complexity and the patient is at high risk for clinical deterioration, therefore this is a level 3 visit.  Shela Leff MD [facility]  If 7PM-7AM, please contact night-coverage [email or web address]  10/21/2019, 6:41 AM

## 2019-10-21 NOTE — Procedures (Signed)
Ultrasound-guided diagnostic and therapeutic paracentesis performed yielding 2 liters of amber colored fluid.  Fluid was sent to lab for analysis. No immediate complications. EBL is none.

## 2019-10-21 NOTE — Plan of Care (Signed)
  Problem: Activity: Goal: Risk for activity intolerance will decrease Outcome: Progressing   

## 2019-10-21 NOTE — Progress Notes (Signed)
[name] is an 59 y.o. male w/ hx of MI, HTN, hep C, chron hep B, ESRD on HD, COPD, Covid PNA 3/25-27/21, now covid neg. anemia , cirrhosis and recurrent ascites presenting to ED w abd pain.  K 4.6  , hgb 8.1 WBC 8.3 , Last hd Friday  On schedule / Seen in [place] same  complaints  Asking for Dilaudid and given and dc . Today admitted to [facility] had US paracentesis 2L amber fluid  This afternoon, Feels better somewhat  Plan for HD tomorrow and if admitted full consult .  Outpt HD:[facility] 4h 64.5kg 2/2 bath 450/800 R U arm AVG Hep none/ ven 50mg  q wk/  Mircera 60 mcg q 2wks (last on 10/02/19 )/ Calcitriol 1.75 mcg po qhd   Ernest Haber, PA-C [place] [phone] 10/21/2019,5:08 PM  LOS: 0 days

## 2019-10-21 NOTE — Progress Notes (Signed)
Pt keeps asking for food. RN informed pt that he is NPO and threw up in the ED. Pt stated that he had a drink on an empty stomach and that made him throw up in the ED. Pt stated that he wanted RN to let the MD know that he wanted jello or he would be sick from not eating. RN messaged NP on call to make aware and to see if she wanted diet order to be changed.   Eleanora Neighbor, RN

## 2019-10-21 NOTE — Progress Notes (Signed)
New Admission Note:   Arrival Method: stretcher  Mental Orientation: Alert and oriented  Telemetry: Box 19  Assessment: Completed Skin: stage 2 right coccyx ZJ:IRCV hand  Pain: Tubes: none  Safety Measures: Safety Fall Prevention Plan has been given, discussed and signed Admission: Completed [facility] Orientation: Patient has been orientated to the room, unit and staff.  Family: None   Orders have been reviewed and implemented. Will continue to monitor the patient. Call light has been placed within reach and bed alarm has been activated.   Everley Evora RN [facility] Phone: [phone]

## 2019-10-21 NOTE — Progress Notes (Signed)
Received order for Dramamine, however, pt is NPO. RN messaged NP on call to make aware and to see if we can get an order for sips with meds.   Eleanora Neighbor, RN

## 2019-10-21 NOTE — ED Triage Notes (Signed)
Per pt he was here yesterday with abdominal pain and said it came back again today. Pt said no vomiting no diarrhea, no fevers

## 2019-10-21 NOTE — ED Provider Notes (Signed)
[facility] DEPARTMENT Provider Note   CSN: [id number] Arrival date & time: 10/21/19  0304     History Chief Complaint  Patient presents with  . Abdominal Pain    [name] is a 59 y.o. male.  PMHx of ESRD on HD, liver cirrhosis with recurrent ascites requiring serial paracentesis, hepatitis C, PAF not on anticoagulation, HTN, chronic pain syndrome, CAD s/p PCI to RCA in 2018,  history of cocaine/marijuana abuse presenting with abdominal pain that onset this evening after eating "egg drop soup".  He was seen for similar pain this morning and was discharged after receiving pain medication and said he felt better.  Patient has a history of cirrhosis and ascites.  Last paracentesis was on March 26.  Patient also seen in the ED on April 6 for similar presentation.  He has a history of ESRD on dialysis reports he went on his usual dialysis day on April ninth.  He has not missed any dialysis sessions.  Feels mildly short of breath due to his abdominal distention.  Denies chest pain.  No cough, no fever, chills, nausea or vomiting.  No diarrhea.  He did not make any urine.  No previous abdominal surgeries. He does not feel that he has significant ascites at this time.  The history is provided by the patient and the EMS personnel.  Abdominal Pain Associated symptoms: shortness of breath   Associated symptoms: no dysuria, no fever, no hematuria, no nausea and no vomiting        Past Medical History:  Diagnosis Date  . Anemia   . Anxiety   . Arthritis    left shoulder  . Atherosclerosis of aorta ([place])   . Cardiomegaly   . Chest pain    DATE UNKNOWN, C/O PERIODICALLY  . Cocaine abuse ([place])   . COPD exacerbation ([place]) 08/17/2016  . Coronary artery disease    stent 02/22/17  . ESRD (end stage renal disease) on dialysis ([place])    "[name]; MWF" (07/04/2017)  . GERD (gastroesophageal reflux disease)    DATE UNKNOWN  . Hemorrhoids   . Hepatitis  B, chronic ([place])   . Hepatitis C   . History of kidney stones   . Hyperkalemia   . Hypertension   . Kidney failure   . Metabolic bone disease    Patient denies  . Mitral stenosis   . Myocardial infarction ([place])   . Pneumonia   . Pulmonary edema   . Solitary rectal ulcer syndrome 07/2017   at flex [facility] for rectal bleeding  . Tubular adenoma of colon     Patient Active Problem List   Diagnosis Date Noted  . Hypoxia 10/04/2019  . Advanced care planning/counseling discussion   . Renal failure 09/26/2019  . Dyspnea 06/09/2019  . Acquired thrombophilia ([place]) 06/05/2019  . A-fib ([place]) 05/30/2019  . Atrial fibrillation with RVR ([place]) 05/29/2019  . Melena   . Pressure injury of skin 03/09/2019  . Abdominal distention   . Volume overload 12/28/2018  . Sepsis ([place]) 09/12/2018  . Atherosclerosis of native coronary artery of native heart without angina pectoris 03/11/2018  . Benign neoplasm of cecum   . Benign neoplasm of ascending colon   . Benign neoplasm of descending colon   . Benign neoplasm of rectum   . Paroxysmal atrial fibrillation ([place]) 01/23/2018  . Hx of colonic polyps 01/20/2018  . End-stage renal disease on hemodialysis ([place]) 11/21/2017  . GERD (gastroesophageal reflux disease) 11/16/2017  .  Decompensated hepatic cirrhosis ([place]) 11/15/2017  . DNR (do not resuscitate)   . Palliative care by specialist   . Hyponatremia 11/04/2017  . SBP (spontaneous bacterial peritonitis) ([place]) 10/30/2017  . Liver disease, chronic 10/30/2017  . SOB (shortness of breath)   . Abdominal pain 10/28/2017  . Upper airway cough syndrome with flattening on f/v loop 10/13/17 c/w vcd 10/17/2017  . Elevated diaphragm 10/13/2017  . Ileus ([place]) 09/29/2017  . QT prolongation 09/29/2017  . Malnutrition of moderate degree 09/29/2017  . Sinus congestion 09/03/2017  . Symptomatic anemia 09/02/2017  . Other cirrhosis of liver ([place]) 09/02/2017  . Left bundle branch block 09/02/2017  . Mitral stenosis  09/02/2017  . Hematochezia 07/15/2017  . Wide-complex tachycardia ([place])   . Endotracheally intubated   . ESRD on dialysis ([place]) 07/04/2017  . Acute respiratory failure with hypoxia ([place]) 06/18/2017  . CKD (chronic kidney disease) stage V requiring chronic dialysis ([place]) 06/18/2017  . History of Cocaine abuse ([place]) 06/18/2017  . Hypertension 06/18/2017  . Infection of AV graft for dialysis ([place]) 06/18/2017  . Anxiety 06/18/2017  . Anemia due to chronic kidney disease 06/18/2017  . Atypical atrial flutter ([place]) 06/18/2017  . Personality disorder ([place]) 06/13/2017  . Cellulitis 06/12/2017  . Adjustment disorder with mixed anxiety and depressed mood 06/10/2017  . Suicidal ideation 06/10/2017  . Arm wound, left, sequela 06/10/2017  . Dyspnea on exertion 05/29/2017  . Tachycardia 05/29/2017  . Hyperkalemia 05/22/2017  . Acute metabolic encephalopathy   . Anemia 04/23/2017  . Ascites 04/23/2017  . COPD (chronic obstructive pulmonary disease) ([place]) 04/23/2017  . Acute on chronic respiratory failure with hypoxia ([place]) 03/25/2017  . Arrhythmia 03/25/2017  . COPD GOLD 0 with flattening on inps f/v  09/27/2016  . Essential hypertension 09/27/2016  . Fluid overload 08/30/2016  . COPD exacerbation ([place]) 08/17/2016  . Hypertensive urgency 08/17/2016  . Respiratory failure ([place]) 08/17/2016  . Problem with dialysis access ([place]) 07/23/2016  . Chronic hepatitis B ([place]) 03/05/2014  . Chronic hepatitis C without hepatic coma ([place]) 03/05/2014  . Internal hemorrhoids with bleeding, swelling and itching 03/05/2014  . Thrombocytopenia ([place]) 03/05/2014  . Chest pain 02/27/2014  . Alcohol abuse 04/14/2009  . Cigarette smoker 04/14/2009  . GANGLION CYST 04/14/2009    Past Surgical History:  Procedure Laterality Date  . A/V FISTULAGRAM Left 05/26/2017   Procedure: A/V FISTULAGRAM;  Surgeon: Conrad Arendtsville, MD;  Location: [facility];  Service: Cardiovascular;  Laterality: Left;  . A/V  FISTULAGRAM Right 11/18/2017   Procedure: A/V FISTULAGRAM - Right Arm;  Surgeon: Elam Dutch, MD;  Location: [facility];  Service: Cardiovascular;  Laterality: Right;  . APPLICATION OF WOUND VAC Left 06/14/2017   Procedure: APPLICATION OF WOUND VAC;  Surgeon: Katha Cabal, MD;  Location: [facility];  Service: Vascular;  Laterality: Left;  . AV FISTULA PLACEMENT  2012   BELIEVED WAS PLACED IN JUNE  . AV FISTULA PLACEMENT Right 08/09/2017   Procedure: Creation Right arm ARTERIOVENOUS BRACHIOCEPOHALIC FISTULA;  Surgeon: Elam Dutch, MD;  Location: [facility] OR;  Service: Vascular;  Laterality: Right;  . AV FISTULA PLACEMENT Right 11/22/2017   Procedure: INSERTION OF ARTERIOVENOUS (AV) GORE-TEX GRAFT RIGHT UPPER ARM;  Surgeon: Elam Dutch, MD;  Location: [facility];  Service: Vascular;  Laterality: Right;  . BIOPSY  01/25/2018   Procedure: BIOPSY;  Surgeon: Jerene Bears, MD;  Location: [facility];  Service: Gastroenterology;;  . BIOPSY  04/10/2019  Procedure: BIOPSY;  Surgeon: Jerene Bears, MD;  Location: [facility];  Service: Gastroenterology;;  . COLONOSCOPY    . COLONOSCOPY WITH PROPOFOL N/A 01/25/2018   Procedure: COLONOSCOPY WITH PROPOFOL;  Surgeon: Jerene Bears, MD;  Location: [facility];  Service: Gastroenterology;  Laterality: N/A;  . CORONARY STENT INTERVENTION N/A 02/22/2017   Procedure: CORONARY STENT INTERVENTION;  Surgeon: Nigel Mormon, MD;  Location: [facility];  Service: Cardiovascular;  Laterality: N/A;  . ESOPHAGOGASTRODUODENOSCOPY (EGD) WITH PROPOFOL N/A 01/25/2018   Procedure: ESOPHAGOGASTRODUODENOSCOPY (EGD) WITH PROPOFOL;  Surgeon: Jerene Bears, MD;  Location: [facility];  Service: Gastroenterology;  Laterality: N/A;  . ESOPHAGOGASTRODUODENOSCOPY (EGD) WITH PROPOFOL N/A 04/10/2019   Procedure: ESOPHAGOGASTRODUODENOSCOPY (EGD) WITH PROPOFOL;  Surgeon: Jerene Bears, MD;  Location: [facility];  Service: Gastroenterology;  Laterality: N/A;    . FLEXIBLE SIGMOIDOSCOPY N/A 07/15/2017   Procedure: FLEXIBLE SIGMOIDOSCOPY;  Surgeon: Carol Ada, MD;  Location: [facility];  Service: Endoscopy;  Laterality: N/A;  . HEMORRHOID BANDING    . I & D EXTREMITY Left 06/01/2017   Procedure: IRRIGATION AND DEBRIDEMENT LEFT ARM HEMATOMA WITH LIGATION OF LEFT ARM AV FISTULA;  Surgeon: Elam Dutch, MD;  Location: [facility];  Service: Vascular;  Laterality: Left;  . I & D EXTREMITY Left 06/14/2017   Procedure: IRRIGATION AND DEBRIDEMENT EXTREMITY;  Surgeon: Katha Cabal, MD;  Location: [facility];  Service: Vascular;  Laterality: Left;  . INSERTION OF DIALYSIS CATHETER  05/30/2017  . INSERTION OF DIALYSIS CATHETER N/A 05/30/2017   Procedure: INSERTION OF DIALYSIS CATHETER;  Surgeon: Elam Dutch, MD;  Location: [facility];  Service: Vascular;  Laterality: N/A;  . IR PARACENTESIS  08/30/2017  . IR PARACENTESIS  09/29/2017  . IR PARACENTESIS  10/28/2017  . IR PARACENTESIS  11/09/2017  . IR PARACENTESIS  11/16/2017  . IR PARACENTESIS  11/28/2017  . IR PARACENTESIS  12/01/2017  . IR PARACENTESIS  12/06/2017  . IR PARACENTESIS  01/03/2018  . IR PARACENTESIS  01/23/2018  . IR PARACENTESIS  02/07/2018  . IR PARACENTESIS  02/21/2018  . IR PARACENTESIS  03/06/2018  . IR PARACENTESIS  03/17/2018  . IR PARACENTESIS  04/04/2018  . IR PARACENTESIS  12/28/2018  . IR PARACENTESIS  01/08/2019  . IR PARACENTESIS  01/23/2019  . IR PARACENTESIS  02/01/2019  . IR PARACENTESIS  02/19/2019  . IR PARACENTESIS  03/01/2019  . IR PARACENTESIS  03/15/2019  . IR PARACENTESIS  04/03/2019  . IR PARACENTESIS  04/12/2019  . IR PARACENTESIS  05/01/2019  . IR PARACENTESIS  05/08/2019  . IR PARACENTESIS  05/24/2019  . IR PARACENTESIS  06/12/2019  . IR PARACENTESIS  07/09/2019  . IR PARACENTESIS  07/27/2019  . IR PARACENTESIS  08/09/2019  . IR PARACENTESIS  08/21/2019  . IR PARACENTESIS  09/17/2019  . IR PARACENTESIS  10/05/2019  . IR RADIOLOGIST EVAL & MGMT  02/14/2018  . IR RADIOLOGIST EVAL  & MGMT  02/22/2019  . LEFT HEART CATH AND CORONARY ANGIOGRAPHY N/A 02/22/2017   Procedure: LEFT HEART CATH AND CORONARY ANGIOGRAPHY;  Surgeon: Nigel Mormon, MD;  Location: [facility];  Service: Cardiovascular;  Laterality: N/A;  . LIGATION OF ARTERIOVENOUS  FISTULA Left 02/13/8849   Procedure: Plication of Left Arm Arteriovenous Fistula;  Surgeon: Elam Dutch, MD;  Location: [facility];  Service: Vascular;  Laterality: Left;  . POLYPECTOMY    . POLYPECTOMY  01/25/2018   Procedure: POLYPECTOMY;  Surgeon: Jerene Bears, MD;  Location: [facility];  Service: Gastroenterology;;  . REVISON OF ARTERIOVENOUS FISTULA Left 10/28/3788   Procedure: PLICATION OF DISTAL ANEURYSMAL SEGEMENT OF LEFT UPPER ARM ARTERIOVENOUS FISTULA;  Surgeon: Elam Dutch, MD;  Location: [facility];  Service: Vascular;  Laterality: Left;  . REVISON OF ARTERIOVENOUS FISTULA Left 2/40/9735   Procedure: Plication of Left Upper Arm Fistula ;  Surgeon: Waynetta Sandy, MD;  Location: [facility];  Service: Vascular;  Laterality: Left;  . SKIN GRAFT SPLIT THICKNESS LEG / FOOT Left    SKIN GRAFT SPLIT THICKNESS LEFT ARM DONOR SITE: LEFT ANTERIOR THIGH  . SKIN SPLIT GRAFT Left 07/04/2017   Procedure: SKIN GRAFT SPLIT THICKNESS LEFT ARM DONOR SITE: LEFT ANTERIOR THIGH;  Surgeon: Elam Dutch, MD;  Location: [name];  Service: Vascular;  Laterality: Left;  . THROMBECTOMY W/ EMBOLECTOMY Left 06/05/2017   Procedure: EXPLORATION OF LEFT ARM FOR BLEEDING; OVERSEWED PROXIMAL FISTULA;  Surgeon: Angelia Mould, MD;  Location: [facility];  Service: Vascular;  Laterality: Left;  . WOUND EXPLORATION Left 06/03/2017   Procedure: WOUND EXPLORATION WITH WOUND VAC APPLICATION TO LEFT ARM;  Surgeon: Angelia Mould, MD;  Location: [facility] OR;  Service: Vascular;  Laterality: Left;       Family History  Problem Relation Age of Onset  . Heart disease Mother   . Lung cancer Mother   . Heart disease Father   . Malignant  hyperthermia Father   . COPD Father   . Throat cancer Sister   . Esophageal cancer Sister   . Hypertension Other   . COPD Other   . Colon cancer Neg Hx   . Colon polyps Neg Hx   . Rectal cancer Neg Hx   . Stomach cancer Neg Hx     Social History   Tobacco Use  . Smoking status: Current Every Day Smoker    Packs/day: 0.50    Years: 43.00    Pack years: 21.50    Types: Cigarettes    Start date: 08/13/1973  . Smokeless tobacco: Never Used  . Tobacco comment: i dont know i just make   Substance Use Topics  . Alcohol use: Not Currently    Comment: quit drinking in 2017  . Drug use: Not Currently    Types: Marijuana, Cocaine    Comment: reports using once every 3 months, 04-06-2019 was this     Home Medications Prior to Admission medications   Medication Sig Start Date End Date Taking? Authorizing Provider  albuterol (VENTOLIN HFA) 108 (90 Base) MCG/ACT inhaler Inhale 2 puffs into the lungs every 6 (six) hours as needed for wheezing or shortness of breath.    [provider]  budesonide-formoterol (SYMBICORT) 160-4.5 MCG/ACT inhaler Inhale 2 puffs into the lungs 2 (two) times daily.    [provider]  diltiazem (TIAZAC) 240 MG 24 hr capsule Take 240 mg by mouth daily. 08/16/19   [provider]  hydrALAZINE (APRESOLINE) 100 MG tablet Take 100 mg by mouth 2 (two) times daily.    [provider]  hydrOXYzine (ATARAX/VISTARIL) 25 MG tablet Take 25 mg by mouth 2 (two) times daily as needed for itching.  08/21/19   [provider]  lactulose (CHRONULAC) 10 GM/15ML solution Take 45 mLs (30 g total) by mouth daily as needed for mild constipation. 10/01/19   [name], MD  metoprolol tartrate (LOPRESSOR) 100 MG tablet Take 1 tablet (100 mg total) by mouth 2 (two) times daily. 06/04/19   Al Decant,  MD  montelukast (SINGULAIR) 10 MG tablet Take 10 mg by mouth every evening.     [provider]  ondansetron (ZOFRAN) 4 MG tablet Take  4 mg by mouth every 4 (four) hours as needed for nausea or vomiting. 09/18/19   [provider]  Oxycodone HCl 10 MG TABS Take 10 mg by mouth 3 (three) times daily as needed (pain).  08/25/19   [provider]  pantoprazole (PROTONIX) 40 MG tablet Take 40 mg by mouth daily.    [provider]  sevelamer carbonate (RENVELA) 800 MG tablet Take 1,600-3,200 mg by mouth See admin instructions. Take 4 tablets (3200 mg) by mouth 3 times daily with meals and 2 tablets (1600 mg) with snacks    [provider]  zolpidem (AMBIEN) 10 MG tablet Take 10 mg by mouth at bedtime as needed for sleep.    [provider]    Allergies    Tramadol, Morphine, Morphine and related, Pollen extract, Acetaminophen, Aspirin, Clonidine, and Clonidine derivatives  Review of Systems   Review of Systems  Constitutional: Negative for activity change, appetite change and fever.  HENT: Negative for congestion and postnasal drip.   Eyes: Negative for visual disturbance.  Respiratory: Positive for shortness of breath.   Gastrointestinal: Positive for abdominal pain. Negative for nausea and vomiting.  Genitourinary: Negative for dysuria and hematuria.  Musculoskeletal: Negative for arthralgias and myalgias.  Skin: Negative for rash.  Neurological: Negative for dizziness, weakness and headaches.   all other systems are negative except as noted in the HPI and PMH.    Physical Exam Updated Vital Signs BP (!) 148/92 (BP Location: Right Arm)   Pulse (!) 110   Temp 97.7 F (36.5 C) (Oral)   Resp 18   SpO2 99%   Physical Exam Vitals and nursing note reviewed.  Constitutional:      General: He is not in acute distress.    Appearance: He is well-developed.     Comments: Chronically ill-appearing  HENT:     Head: Normocephalic and atraumatic.     Mouth/Throat:     Pharynx: No oropharyngeal exudate.  Eyes:     Conjunctiva/sclera: Conjunctivae normal.     Pupils: Pupils are  equal, round, and reactive to light.  Neck:     Comments: No meningismus. Cardiovascular:     Rate and Rhythm: Normal rate and regular rhythm.     Heart sounds: Normal heart sounds. No murmur.  Pulmonary:     Effort: Pulmonary effort is normal. No respiratory distress.     Breath sounds: Normal breath sounds.  Abdominal:     General: There is distension.     Palpations: Abdomen is soft.     Tenderness: There is abdominal tenderness. There is guarding. There is no rebound.     Comments: Ascites present, not tense, diffuse tenderness with periumbilical tenderness with voluntary guarding  Musculoskeletal:        General: No tenderness. Normal range of motion.     Cervical back: Normal range of motion and neck supple.     Comments: Right AV fistula with thrill  Skin:    General: Skin is warm.  Neurological:     Mental Status: He is alert and oriented to person, place, and time.     Cranial Nerves: No cranial nerve deficit.     Motor: No abnormal muscle tone.     Coordination: Coordination normal.     Comments: No ataxia on finger to nose bilaterally.  No pronator drift. 5/5 strength throughout. CN 2-12 intact.Equal grip strength. Sensation intact.   Psychiatric:        Behavior: Behavior normal.     ED Results / Procedures / Treatments   Labs (all labs ordered are listed, but only abnormal results are displayed) Labs Reviewed  CBC WITH DIFFERENTIAL/PLATELET - Abnormal; Notable for the following components:      Result Value   RBC 2.71 (*)    Hemoglobin 8.1 (*)    HCT 22.4 (*)    MCHC 36.2 (*)    RDW 16.0 (*)    Basophils Absolute 0.2 (*)    All other components within normal limits  COMPREHENSIVE METABOLIC PANEL - Abnormal; Notable for the following components:   Chloride 95 (*)    BUN 33 (*)    Creatinine, Ser 5.30 (*)    Total Protein 6.4 (*)    Albumin 2.7 (*)    Alkaline Phosphatase 158 (*)    GFR calc non Af Amer 11 (*)    GFR calc Af Amer 13 (*)    Anion gap 18  (*)    All other components within normal limits  SARS CORONAVIRUS 2 (TAT 6-24 HRS)  LIPASE, BLOOD  MAGNESIUM  LACTATE DEHYDROGENASE    EKG EKG Interpretation  Date/Time:  Sunday October 21 2019 03:34:41 EDT Ventricular Rate:  113 PR Interval:    QRS Duration: 174 QT Interval:  453 QTC Calculation: 622 R Axis:   122 Text Interpretation: Atrial fibrillation Nonspecific intraventricular conduction delay Anterior infarct, possibly acute No significant change was found Confirmed by Ezequiel Essex [phone]) on 10/21/2019 3:50:20 AM   Radiology DG Chest Portable 1 View  Result Date: 10/20/2019 CLINICAL DATA:  Abdominal pain. EXAM: PORTABLE CHEST 1 VIEW COMPARISON:  Radiograph 10/04/2019 FINDINGS: Enlarged cardiac silhouette similar prior. There is new fine airspace disease in the RIGHT perihilar lung. Mild airspace disease in the LEFT lung. No focal consolidation. No pneumothorax. IMPRESSION: Cardiomegaly and fine perihilar airspace suggest pulmonary edema. Pneumonia less favored. Airspace disease is asymmetric, greater on the RIGHT. Electronically Signed   By: Suzy Bouchard M.D.   On: 10/20/2019 07:57    Procedures Ultrasound ED Abd  Date/Time: 10/21/2019 3:51 AM Performed by: Ezequiel Essex, MD Authorized by: Ezequiel Essex, MD   Procedure details:    Indications: abdominal pain     Assessment for:  Intra-abdominal fluid   Aorta:  Not visualized   Left renal:  Visualized   Right renal:  Visualized   Hepatobiliary:  Visualized   Images: archived    Comments:     Ascites present though bowel loops in close proximity to abdominal wall   (including critical care time)  Medications Ordered in ED Medications  HYDROmorphone (DILAUDID) injection 1 mg (has no administration in time range)    ED Course  I have reviewed the triage vital signs and the nursing notes.  Pertinent labs & imaging results that were available during my care of the patient were reviewed by me and  considered in my medical decision making (see chart for details).    MDM Rules/Calculators/A&P                      ESRD and cirrhosis here with abdominal pain.  No fever, chills, nausea, vomiting.  No chest pain.  Stable vitals. No fever.   Labs with stable anemia. No hyperkalemia. No increased work of breathing.  Bedside ultrasound shows some ascites with bowel loops close to  abdominal wall. No safe pocket for paracentesis. Patient states he doesn't think that he needs a paracentesis.  Low suspicion for SBP. No fever.  Patient with recurrent abdominal pain.  He does have some ascites but no drainable safe pocket at this time.  Given recurrent visits for same presentation will ask hospitalist to evaluate for possible IR paracentesis later today.  Low suspicion for acute surgical pathology.  Discussed with Dr. Marlowe Sax.   Final Clinical Impression(s) / ED Diagnoses Final diagnoses:  Ascites  ESRD (end stage renal disease) [facility])    Rx / DC Orders ED Discharge Orders    None       Aveya Beal, Annie Main, MD 10/21/19 [phone]

## 2019-10-21 NOTE — Progress Notes (Signed)
Pt c/o nausea. RN needs orders for nausea meds. RN messaged NP on call to make aware. Awaiting response.   Eleanora Neighbor, RN

## 2019-10-22 DIAGNOSIS — Z800 Family history of malignant neoplasm of digestive organs: Secondary | ICD-10-CM | POA: Diagnosis not present

## 2019-10-22 DIAGNOSIS — N2581 Secondary hyperparathyroidism of renal origin: Secondary | ICD-10-CM | POA: Diagnosis present

## 2019-10-22 DIAGNOSIS — R1084 Generalized abdominal pain: Secondary | ICD-10-CM | POA: Diagnosis present

## 2019-10-22 DIAGNOSIS — N186 End stage renal disease: Secondary | ICD-10-CM | POA: Diagnosis present

## 2019-10-22 DIAGNOSIS — R188 Other ascites: Secondary | ICD-10-CM | POA: Diagnosis present

## 2019-10-22 DIAGNOSIS — K7469 Other cirrhosis of liver: Secondary | ICD-10-CM | POA: Diagnosis present

## 2019-10-22 DIAGNOSIS — I700 Atherosclerosis of aorta: Secondary | ICD-10-CM | POA: Diagnosis present

## 2019-10-22 DIAGNOSIS — Z992 Dependence on renal dialysis: Secondary | ICD-10-CM | POA: Diagnosis not present

## 2019-10-22 DIAGNOSIS — I252 Old myocardial infarction: Secondary | ICD-10-CM | POA: Diagnosis not present

## 2019-10-22 DIAGNOSIS — B182 Chronic viral hepatitis C: Secondary | ICD-10-CM | POA: Diagnosis present

## 2019-10-22 DIAGNOSIS — Z801 Family history of malignant neoplasm of trachea, bronchus and lung: Secondary | ICD-10-CM | POA: Diagnosis not present

## 2019-10-22 DIAGNOSIS — Z20822 Contact with and (suspected) exposure to covid-19: Secondary | ICD-10-CM | POA: Diagnosis present

## 2019-10-22 DIAGNOSIS — D631 Anemia in chronic kidney disease: Secondary | ICD-10-CM | POA: Diagnosis present

## 2019-10-22 DIAGNOSIS — F17210 Nicotine dependence, cigarettes, uncomplicated: Secondary | ICD-10-CM | POA: Diagnosis present

## 2019-10-22 DIAGNOSIS — G894 Chronic pain syndrome: Secondary | ICD-10-CM | POA: Diagnosis present

## 2019-10-22 DIAGNOSIS — Z66 Do not resuscitate: Secondary | ICD-10-CM | POA: Diagnosis present

## 2019-10-22 DIAGNOSIS — E8770 Fluid overload, unspecified: Secondary | ICD-10-CM | POA: Diagnosis present

## 2019-10-22 DIAGNOSIS — I480 Paroxysmal atrial fibrillation: Secondary | ICD-10-CM | POA: Diagnosis present

## 2019-10-22 DIAGNOSIS — I2510 Atherosclerotic heart disease of native coronary artery without angina pectoris: Secondary | ICD-10-CM | POA: Diagnosis present

## 2019-10-22 DIAGNOSIS — I1311 Hypertensive heart and chronic kidney disease without heart failure, with stage 5 chronic kidney disease, or end stage renal disease: Secondary | ICD-10-CM | POA: Diagnosis present

## 2019-10-22 DIAGNOSIS — I447 Left bundle-branch block, unspecified: Secondary | ICD-10-CM | POA: Diagnosis present

## 2019-10-22 DIAGNOSIS — Z955 Presence of coronary angioplasty implant and graft: Secondary | ICD-10-CM | POA: Diagnosis not present

## 2019-10-22 DIAGNOSIS — M19012 Primary osteoarthritis, left shoulder: Secondary | ICD-10-CM | POA: Diagnosis present

## 2019-10-22 DIAGNOSIS — B181 Chronic viral hepatitis B without delta-agent: Secondary | ICD-10-CM | POA: Diagnosis present

## 2019-10-22 DIAGNOSIS — K219 Gastro-esophageal reflux disease without esophagitis: Secondary | ICD-10-CM | POA: Diagnosis present

## 2019-10-22 LAB — CBC WITH DIFFERENTIAL/PLATELET
Abs Immature Granulocytes: 0.03 10*3/uL (ref 0.00–0.07)
Basophils Absolute: 0.1 10*3/uL (ref 0.0–0.1)
Basophils Relative: 2 %
Eosinophils Absolute: 0.2 10*3/uL (ref 0.0–0.5)
Eosinophils Relative: 2 %
HCT: 24.4 % — ABNORMAL LOW (ref 39.0–52.0)
Hemoglobin: 8.6 g/dL — ABNORMAL LOW (ref 13.0–17.0)
Immature Granulocytes: 0 %
Lymphocytes Relative: 23 %
Lymphs Abs: 2 10*3/uL (ref 0.7–4.0)
MCH: 26.7 pg (ref 26.0–34.0)
MCHC: 35.2 g/dL (ref 30.0–36.0)
MCV: 75.8 fL — ABNORMAL LOW (ref 80.0–100.0)
Monocytes Absolute: 0.7 10*3/uL (ref 0.1–1.0)
Monocytes Relative: 8 %
Neutro Abs: 5.8 10*3/uL (ref 1.7–7.7)
Neutrophils Relative %: 65 %
Platelets: 234 10*3/uL (ref 150–400)
RBC: 3.22 MIL/uL — ABNORMAL LOW (ref 4.22–5.81)
RDW: 16.1 % — ABNORMAL HIGH (ref 11.5–15.5)
WBC: 8.7 10*3/uL (ref 4.0–10.5)
nRBC: 0 % (ref 0.0–0.2)

## 2019-10-22 LAB — COMPREHENSIVE METABOLIC PANEL
ALT: 18 U/L (ref 0–44)
AST: 38 U/L (ref 15–41)
Albumin: 2.7 g/dL — ABNORMAL LOW (ref 3.5–5.0)
Alkaline Phosphatase: 161 U/L — ABNORMAL HIGH (ref 38–126)
Anion gap: 22 — ABNORMAL HIGH (ref 5–15)
BUN: 43 mg/dL — ABNORMAL HIGH (ref 6–20)
CO2: 22 mmol/L (ref 22–32)
Calcium: 9.5 mg/dL (ref 8.9–10.3)
Chloride: 94 mmol/L — ABNORMAL LOW (ref 98–111)
Creatinine, Ser: 6.54 mg/dL — ABNORMAL HIGH (ref 0.61–1.24)
GFR calc Af Amer: 10 mL/min — ABNORMAL LOW (ref >60)
GFR calc non Af Amer: 9 mL/min — ABNORMAL LOW (ref >60)
Glucose, Bld: 92 mg/dL (ref 70–99)
Potassium: 5.6 mmol/L — ABNORMAL HIGH (ref 3.5–5.1)
Sodium: 138 mmol/L (ref 135–145)
Total Bilirubin: 1 mg/dL (ref 0.3–1.2)
Total Protein: 6.2 g/dL — ABNORMAL LOW (ref 6.5–8.1)

## 2019-10-22 MED ORDER — SODIUM CHLORIDE 0.9 % IV BOLUS
500.0000 mL | Freq: Once | INTRAVENOUS | Status: AC
  Administered 2019-10-22: 500 mL via INTRAVENOUS

## 2019-10-22 MED ORDER — MONTELUKAST SODIUM 10 MG PO TABS
10.0000 mg | ORAL_TABLET | Freq: Every evening | ORAL | Status: DC
  Administered 2019-10-22: 10 mg via ORAL
  Filled 2019-10-22: qty 1

## 2019-10-22 MED ORDER — SODIUM CHLORIDE 0.9 % IV BOLUS: 250.0000 mL | Freq: Once | INTRAVENOUS | Status: DC

## 2019-10-22 MED ORDER — WHITE PETROLATUM EX OINT
TOPICAL_OINTMENT | CUTANEOUS | Status: AC
  Administered 2019-10-22: 0.2
  Filled 2019-10-22: qty 28.35

## 2019-10-22 MED ORDER — DARBEPOETIN ALFA 100 MCG/0.5ML IJ SOSY
PREFILLED_SYRINGE | INTRAMUSCULAR | Status: AC
  Administered 2019-10-22: 100 ug via INTRAVENOUS
  Filled 2019-10-22: qty 0.5

## 2019-10-22 MED ORDER — CALCITRIOL 0.5 MCG PO CAPS
ORAL_CAPSULE | ORAL | Status: AC
  Filled 2019-10-22: qty 3

## 2019-10-22 MED ORDER — CALCITRIOL 0.25 MCG PO CAPS
ORAL_CAPSULE | ORAL | Status: AC
  Filled 2019-10-22: qty 1

## 2019-10-22 MED ORDER — ORAL CARE MOUTH RINSE
15.0000 mL | Freq: Two times a day (BID) | OROMUCOSAL | Status: DC
  Administered 2019-10-22 – 2019-10-23 (×2): 15 mL via OROMUCOSAL

## 2019-10-22 MED ORDER — METOPROLOL TARTRATE 100 MG PO TABS
100.0000 mg | ORAL_TABLET | Freq: Two times a day (BID) | ORAL | Status: DC
  Administered 2019-10-22 – 2019-10-23 (×3): 100 mg via ORAL
  Filled 2019-10-22 (×3): qty 1

## 2019-10-22 MED ORDER — ALBUTEROL SULFATE (2.5 MG/3ML) 0.083% IN NEBU: 2.5000 mg | INHALATION_SOLUTION | Freq: Four times a day (QID) | RESPIRATORY_TRACT | Status: DC | PRN

## 2019-10-22 MED ORDER — PANTOPRAZOLE SODIUM 40 MG PO TBEC
40.0000 mg | DELAYED_RELEASE_TABLET | Freq: Every day | ORAL | Status: DC
  Administered 2019-10-22 – 2019-10-23 (×2): 40 mg via ORAL
  Filled 2019-10-22 (×2): qty 1

## 2019-10-22 MED ORDER — SEVELAMER CARBONATE 800 MG PO TABS
3200.0000 mg | ORAL_TABLET | Freq: Three times a day (TID) | ORAL | Status: DC
  Filled 2019-10-22: qty 4

## 2019-10-22 MED ORDER — DILTIAZEM HCL ER COATED BEADS 120 MG PO CP24
240.0000 mg | ORAL_CAPSULE | Freq: Every day | ORAL | Status: DC
  Administered 2019-10-23: 240 mg via ORAL
  Filled 2019-10-22: qty 1
  Filled 2019-10-22: qty 2
  Filled 2019-10-22: qty 1
  Filled 2019-10-22: qty 2

## 2019-10-22 MED ORDER — LACTULOSE 10 GM/15ML PO SOLN: 30.0000 g | Freq: Every day | ORAL | Status: DC | PRN

## 2019-10-22 MED ORDER — SEVELAMER CARBONATE 800 MG PO TABS
1600.0000 mg | ORAL_TABLET | ORAL | Status: DC
  Administered 2019-10-23: 1600 mg via ORAL

## 2019-10-22 NOTE — Procedures (Signed)
I was present at this dialysis session. I have reviewed the session itself and made appropriate changes.   Vital signs in last 24 hours:  Temp:  [97.6 F (36.4 C)-99.4 F (37.4 C)] 97.7 F (36.5 C) (04/12 1415) Pulse Rate:  [94-137] 120 (04/12 1415) Resp:  [16-22] 21 (04/12 1415) BP: (79-168)/(25-105) 133/81 (04/12 1415) SpO2:  [90 %-100 %] 100 % (04/12 1415) Weight:  [62.9 kg-68.9 kg] 68.9 kg (04/12 1415) Weight change:  Filed Weights   10/21/19 1620 10/21/19 2103 10/22/19 1415  Weight: 62.9 kg 67.7 kg 68.9 kg    Recent Labs  Lab 10/22/19 0214  NA 138  K 5.6*  CL 94*  CO2 22  GLUCOSE 92  BUN 43*  CREATININE 6.54*  CALCIUM 9.5    Recent Labs  Lab 10/20/19 0743 10/21/19 0314 10/22/19 0214  WBC 8.1 8.3 8.7  NEUTROABS 5.8 5.8 5.8  HGB 8.1* 8.1* 8.6*  HCT 24.1* 22.4* 24.4*  MCV 78.5* 82.7 75.8*  PLT 202 198 234    Scheduled Meds: . calcitRIOL  1.75 mcg Oral Q M,W,F-HD  . darbepoetin (ARANESP) injection - DIALYSIS  100 mcg Intravenous Q Mon-HD  . diltiazem  240 mg Oral Daily  . metoprolol tartrate  100 mg Oral BID  . montelukast  10 mg Oral QPM  . nicotine  14 mg Transdermal Daily  . pantoprazole  40 mg Oral Daily  . sevelamer carbonate  1,600 mg Oral With snacks  . sevelamer carbonate  3,200 mg Oral TID WC  . white petrolatum       Continuous Infusions: . [START ON 10/24/2019] ferric gluconate (FERRLECIT/NULECIT) IV     PRN Meds:.albuterol, HYDROmorphone (DILAUDID) injection, lactulose, metoprolol tartrate   Donetta Potts,  MD 10/22/2019, 2:41 PM

## 2019-10-22 NOTE — Progress Notes (Signed)
Pt just got up and had second loose large BM. Pt is tachy and in AFIB. HR was sustaining greater than 120 and was up to 172. RN gave Lopressor per order and went to recheck pt. BP is now low at 84/68 manual and still tachy. RN messaged NP on call to make aware and for further directions. Awaiting response.   Eleanora Neighbor, RN

## 2019-10-22 NOTE — Progress Notes (Signed)
PROGRESS NOTE    [name]  RJJ:[id number] DOB: 1960/11/16 DOA: 10/21/2019 PCP: [name], FNP   Brief Narrative:  Joshua Bidinger Hoskinsis a 59 y.o.malewith medical history significant ofESRD on HD MWF, liver cirrhosis with recurrent ascites requiring serial paracentesis, chronic hep B, hep C, paroxysmal A. fib not on anticoagulation, hypertension, chronic pain syndrome, CAD status post PCI, COPD, hypertension, history of cocaine/marijuana abuse presenting with complaints of abdominal pain. Patient was seen in the ED yesterday for the same complaint and his abdominal pain was felt to be related to mild ascites. He was discharged with plan to follow-up with GI however he presented back to ED on late night of 1421 with generalized worsening abdominal pain which was likely going on for 3 months.  He did not have any nausea vomiting or diarrhea.  No fever or chills.He was diagnosed to have ascites causing the abdominal pain.  He was not suspected to have SBP so no antibiotics were given.  IR was consulted and 2 L of amber-colored fluid was removed via paracentesis.  Fluid analysis does not seem to be infectious.  Assessment & Plan:   Principal Problem:   Abdominal pain Active Problems:   COPD GOLD 0 with flattening on inps f/v    Essential hypertension   Ascites   A-fib (HCC)   Abdominal pain/recurrent ascites: Still complains of abdominal pain.  Tender on examination.  Now also has some diarrhea but no nausea or vomiting. Lipase normal.  Alk phos elevated in setting of ESRD.  Transaminases and T bili normal.  SBP less likely given no fever or leukocytosis and abdominal pain appears to be chronic with normal fluid analysis of ascitic fluid. Status post 2 L of acetic fluid drained on 10/21/2019.  ESRD on HD MWF/ mild volume overload: Patient states his last dialysis session was 4/9.  Chest x-ray showing slight improvement in pulmonary edema pattern.  Lungs clear on exam.   Nephrology on board.  Defer management to them.  Paroxysmal A. Fib: Currently in A. fib with controlled rates however he had significantly elevated rates overnight.  Upon chart review, it sounds like patient takes beta-blocker and calcium channel blockers at home and for some reason, they are on hold.  We will resume them.  Monitor on telemetry.  He is not a candidate for anticoagulation.  Liver cirrhosis/hep C: Management of ascites as mentioned above.  No signs of encephalopathy at this time.  Hypertension: Labile and currently hypotensive.  Resume metoprolol and diltiazem.  COPD: Stable.  No cough or wheezing.  Resume home inhalers.  QT prolongation EKG: Continue cardiac monitoring.  Keep potassium above 4 and magnesium above 2.  Avoid QT prolonging drugs if possible.  Repeat EKG in a.m.   DVT prophylaxis: SCD   Code Status: DNR  Family Communication:  None present at bedside.  Plan of care discussed with patient in length and he verbalized understanding and agreed with it. Patient is from: Home Disposition Plan: Home Barriers to discharge: Persistent abdominal pain with uncontrolled atrial fibrillation  Status is: Observation  The patient will require care spanning > 2 midnights and should be moved to inpatient because: Ongoing active pain requiring inpatient pain management  Dispo: The patient is from: Home              Anticipated d/c is to: Home              Anticipated d/c date is: 1 day  Patient currently is not medically stable to d/c.        Estimated body mass index is 22.04 kg/m as calculated from the following:   Height as of this encounter: 5' 9" (1.753 m).   Weight as of this encounter: 67.7 kg.  Pressure Injury 10/04/19 Coccyx Stage 2 -  Partial thickness loss of dermis presenting as a shallow open injury with a red, pink wound bed without slough. (Active)  10/04/19 2100  Location: Coccyx  Location Orientation:   Staging: Stage 2 -   Partial thickness loss of dermis presenting as a shallow open injury with a red, pink wound bed without slough.  Wound Description (Comments):   Present on Admission: Yes     Nutritional status:               Consultants:   None  Procedures:   Paracentesis 10/21/2019  Antimicrobials:  Anti-infectives (From admission, onward)   None         Subjective: Seen and examined.  Complains of severe abdominal pain.  Some diarrhea that started last night.  No other complaint.  Objective: Vitals:   10/22/19 0148 10/22/19 0316 10/22/19 0605 10/22/19 0947  BP: (!) 84/68 128/83 (!) 136/91 (!) 168/76  Pulse:  (!) 132 (!) 134 (!) 106  Resp:  _0 Temp:  98.5 F (36.9 C) 97.6 F (36.4 C)   TempSrc:  Oral Oral   SpO2:  97% 99% 95%  Weight:      Height:        Intake/Output Summary (Last 24 hours) at 10/22/2019 1241 Last data filed at 10/22/2019 0900 Gross per 24 hour  Intake 500.02 ml  Output 0 ml  Net 500.02 ml   Filed Weights   10/21/19 1620 10/21/19 2103  Weight: 62.9 kg 67.7 kg    Examination:  General exam: Appears slightly uncomfortable due to pain. Respiratory system: Clear to auscultation. Respiratory effort normal. Cardiovascular system: S1 & S2 heard, RRR. No JVD, murmurs, rubs, gallops or clicks. No pedal edema. Gastrointestinal system: Abdomen is nondistended, soft and generalized tender. No organomegaly or masses felt. Normal bowel sounds heard. Central nervous system: Alert and oriented. No focal neurological deficits. Extremities: Symmetric 5 x 5 power. Skin: No rashes, lesions or ulcers Psychiatry: Judgement and insight appear poor. Mood & affect sad and in pain.   Data Reviewed: I have personally reviewed following labs and imaging studies  CBC: Recent Labs  Lab 10/20/19 0743 10/21/19 0314 10/22/19 0214  WBC 8.1 8.3 8.7  NEUTROABS 5.8 5.8 5.8  HGB 8.1* 8.1* 8.6*  HCT 24.1* 22.4* 24.4*  MCV 78.5* 82.7 75.8*  PLT 202 198 037    Basic Metabolic Panel: Recent Labs  Lab 10/16/19 1247 10/20/19 0743 10/21/19 0314 10/22/19 0214  NA 137 138 138 138  K 4.8 4.8 4.6 5.6*  CL 94* 94* 95* 94*  CO2 _1 GLUCOSE 89 98 92 92  BUN 20 23* 33* 43*  CREATININE 4.42* 4.30* 5.30* 6.54*  CALCIUM 9.3 9.4 9.5 9.5  MG  --  1.9 2.1  --    GFR: Estimated Creatinine Clearance: 11.9 mL/min (A) (by C-G formula based on SCr of 6.54 mg/dL (H)). Liver Function Tests: Recent Labs  Lab 10/20/19 0743 10/21/19 0314 10/22/19 0214  AST 29 26 38  ALT _2 ALKPHOS 164* 158* 161*  BILITOT 0.9 1.1 1.0  PROT 6.3* 6.4* 6.2*  ALBUMIN 2.8* 2.7* 2.7*   Recent  Labs  Lab 10/20/19 0743 10/21/19 0314  LIPASE 22 19   No results for input(s): AMMONIA in the last 168 hours. Coagulation Profile: Recent Labs  Lab 10/20/19 0743  INR 1.2   Cardiac Enzymes: No results for input(s): CKTOTAL, CKMB, CKMBINDEX, TROPONINI in the last 168 hours. BNP (last 3 results) No results for input(s): PROBNP in the last 8760 hours. HbA1C: No results for input(s): HGBA1C in the last 72 hours. CBG: No results for input(s): GLUCAP in the last 168 hours. Lipid Profile: No results for input(s): CHOL, HDL, LDLCALC, TRIG, CHOLHDL, LDLDIRECT in the last 72 hours. Thyroid Function Tests: No results for input(s): TSH, T4TOTAL, FREET4, T3FREE, THYROIDAB in the last 72 hours. Anemia Panel: No results for input(s): VITAMINB12, FOLATE, FERRITIN, TIBC, IRON, RETICCTPCT in the last 72 hours. Sepsis Labs: No results for input(s): PROCALCITON, LATICACIDVEN in the last 168 hours.  Recent Results (from the past 240 hour(s))  SARS CORONAVIRUS 2 (TAT 6-24 HRS) Nasopharyngeal Nasopharyngeal Swab     Status: None   Collection Time: 10/21/19  6:14 AM   Specimen: Nasopharyngeal Swab  Result Value Ref Range Status   SARS Coronavirus 2 NEGATIVE NEGATIVE Final    Comment: (NOTE) SARS-CoV-2 target nucleic acids are NOT DETECTED. The SARS-CoV-2 RNA is  generally detectable in upper and lower respiratory specimens during the acute phase of infection. Negative results do not preclude SARS-CoV-2 infection, do not rule out co-infections with other pathogens, and should not be used as the sole basis for treatment or other patient management decisions. Negative results must be combined with clinical observations, patient history, and epidemiological information. The expected result is Negative. Fact Sheet for Patients: https://www.fda.gov/media/138098/download Fact Sheet for Healthcare Providers: https://www.fda.gov/media/138095/download This test is not yet approved or cleared by the [place] FDA and  has been authorized for detection and/or diagnosis of SARS-CoV-2 by FDA under an Emergency Use Authorization (EUA). This EUA will remain  in effect (meaning this test can be used) for the duration of the COVID-19 declaration under Section 56 4(b)(1) of the Act, 21 U.S.C. section 360bbb-3(b)(1), unless the authorization is terminated or revoked sooner. Performed at [facility], [address] [place], [place] [address]   Gram stain     Status: None   Collection Time: 10/21/19  1:00 PM   Specimen: PATH Cytology Peritoneal fluid  Result Value Ref Range Status   Specimen Description PERITONEAL FLUID  Final   Special Requests NONE  Final   Gram Stain   Final    WBC PRESENT, PREDOMINANTLY MONONUCLEAR NO ORGANISMS SEEN CYTOSPIN SMEAR Performed at  [facility], [address] [place], [place] [address]    Report Status 10/21/2019 FINAL  Final      Radiology Studies: US Paracentesis  Result Date: 10/21/2019 INDICATION: Patient with history of hepatitis C cirrhosis end-stage renal disease with recurrent ascites request for therapeutic and diagnostic paracentesis EXAM: ULTRASOUND GUIDED THERAPEUTIC AND DIAGNOSTIC PARACENTESIS MEDICATIONS: Lidocaine 1% 10 mL COMPLICATIONS: None immediate. PROCEDURE: Informed written consent  was obtained from the patient after a discussion of the risks, benefits and alternatives to treatment. A timeout was performed prior to the initiation of the procedure. Initial ultrasound scanning demonstrates a small amount of ascites within the right lower abdominal quadrant. The right lower abdomen was prepped and draped in the usual sterile fashion. 1% lidocaine was used for local anesthesia. Following this, a 6 Fr Safe-T-Centesis catheter was introduced. An ultrasound image was saved for documentation purposes. The paracentesis was performed.   The catheter was removed and a dressing was applied. The patient tolerated the procedure well without immediate post procedural complication. FINDINGS: A total of approximately 2 L of amber color fluid was removed. Samples were sent to the laboratory as requested by the clinical team. IMPRESSION: Successful ultrasound-guided therapeutic and diagnostic paracentesis yielding 2 liters of peritoneal fluid. Read by Rushie Nyhan NP Electronically Signed   By: Lucrezia Europe M.D.   On: 10/21/2019 13:51   DG Chest Portable 1 View  Result Date: 10/21/2019 CLINICAL DATA:  Abdominal painsob EXAM: PORTABLE CHEST 1 VIEW COMPARISON:  Four hundred ten 21 FINDINGS: Stable enlarged cardiac silhouette. Perihilar airspace disease slightly improved. Elevation LEFT hemidiaphragm. No pleural fluid no pneumothorax. Vascular calcification in the RIGHT subclavian artery. IMPRESSION: Slight improvement in pulmonary edema pattern. Stable cardiomegaly Electronically Signed   By: Suzy Bouchard M.D.   On: 10/21/2019 04:06    Scheduled Meds: . calcitRIOL  1.75 mcg Oral Q M,W,F-HD  . darbepoetin (ARANESP) injection - DIALYSIS  100 mcg Intravenous Q Mon-HD  . diltiazem  240 mg Oral Daily  . metoprolol tartrate  100 mg Oral BID  . montelukast  10 mg Oral QPM  . nicotine  14 mg Transdermal Daily  . pantoprazole  40 mg Oral Daily  . sevelamer carbonate  1,600 mg Oral With snacks  .  sevelamer carbonate  3,200 mg Oral TID WC   Continuous Infusions: . [START ON 10/24/2019] ferric gluconate (FERRLECIT/NULECIT) IV       LOS: 0 days   Time spent: 37 minutes   Darliss Cheney, MD [facility]  10/22/2019, 12:41 PM   To contact the attending provider between 7A-7P or the covering provider during after hours 7P-7A, please log into the web site [email or web address].

## 2019-10-22 NOTE — Progress Notes (Signed)
NP ordered stat CBC and bolus of 574mL. RN messaged again to see if she wants to address pulse. Tele states that pt is in and out of AFIB. RN spoke with NP and she stated to do the fluid first and re-assess. Pt is resting comfortably in bed at this time. Will continue to monitor.   Eleanora Neighbor, RN

## 2019-10-22 NOTE — Consult Note (Addendum)
[facility] Renal Consultation Note    Indication for Consultation:  Management of ESRD/hemodialysis; anemia, hypertension/volume and secondary hyperparathyroidism PCP:  HPI: [name] is a 59 y.o. male with ESRD on hemodialysis MWF at [facility]. PMH: HTN, Chronic hepatitis C with cirrhosis, recurrent ascites, COPD, PAF not on anticoagulation, chronic pain, medical noncompliance with truncated HD treatments, polysubstance abuse. He was admitted as observation patient patient 10/21/2019 with abdominal pain.  Transaminases and bilirubin WNL. Had paracentesis 10/21/2019 2 liters output. He has since been upgraded to in patient. Seen this morning, throwing up in floor-clear watery emesis. Said he thought he was going to die. C/O abdominal pain upper quadrants of abdomen, said he needed pain medications. On going issues with ascites, multiple paracentesis for ascites. Since then, feels better. Currently on HD per schedule without complaints.   Past Medical History:  Diagnosis Date  . Anemia   . Anxiety   . Arthritis    left shoulder  . Atherosclerosis of aorta ([place])   . Cardiomegaly   . Chest pain    DATE UNKNOWN, C/O PERIODICALLY  . Cocaine abuse ([place])   . COPD exacerbation ([place]) 08/17/2016  . Coronary artery disease    stent 02/22/17  . ESRD (end stage renal disease) on dialysis ([place])    "E. [name]; MWF" (07/04/2017)  . GERD (gastroesophageal reflux disease)    DATE UNKNOWN  . Hemorrhoids   . Hepatitis B, chronic ([place])   . Hepatitis C   . History of kidney stones   . Hyperkalemia   . Hypertension   . Kidney failure   . Metabolic bone disease    Patient denies  . Mitral stenosis   . Myocardial infarction ([place])   . Pneumonia   . Pulmonary edema   . Solitary rectal ulcer syndrome 07/2017   at flex sig for rectal bleeding  . Tubular adenoma of colon    Past Surgical History:  Procedure Laterality Date  . A/V FISTULAGRAM Left  05/26/2017   Procedure: A/V FISTULAGRAM;  Surgeon: Conrad [place], MD;  Location: [place] [facility];  Service: Cardiovascular;  Laterality: Left;  . A/V FISTULAGRAM Right 11/18/2017   Procedure: A/V FISTULAGRAM - Right Arm;  Surgeon: Elam Dutch, MD;  Location: [place] [facility];  Service: Cardiovascular;  Laterality: Right;  . APPLICATION OF WOUND VAC Left 06/14/2017   Procedure: APPLICATION OF WOUND VAC;  Surgeon: Katha Cabal, MD;  Location: [facility];  Service: Vascular;  Laterality: Left;  . AV FISTULA PLACEMENT  2012   BELIEVED WAS PLACED IN JUNE  . AV FISTULA PLACEMENT Right 08/09/2017   Procedure: Creation Right arm ARTERIOVENOUS BRACHIOCEPOHALIC FISTULA;  Surgeon: Elam Dutch, MD;  Location: [facility] OR;  Service: Vascular;  Laterality: Right;  . AV FISTULA PLACEMENT Right 11/22/2017   Procedure: INSERTION OF ARTERIOVENOUS (AV) GORE-TEX GRAFT RIGHT UPPER ARM;  Surgeon: Elam Dutch, MD;  Location: [place];  Service: Vascular;  Laterality: Right;  . BIOPSY  01/25/2018   Procedure: BIOPSY;  Surgeon: Jerene Bears, MD;  Location: [place];  Service: Gastroenterology;;  . BIOPSY  04/10/2019   Procedure: BIOPSY;  Surgeon: Jerene Bears, MD;  Location: [facility];  Service: Gastroenterology;;  . COLONOSCOPY    . COLONOSCOPY WITH PROPOFOL N/A 01/25/2018   Procedure: COLONOSCOPY WITH PROPOFOL;  Surgeon: Jerene Bears, MD;  Location: [place];  Service: Gastroenterology;  Laterality: N/A;  . CORONARY STENT INTERVENTION N/A 02/22/2017   Procedure:  CORONARY STENT INTERVENTION;  Surgeon: Nigel Mormon, MD;  Location: [place] [facility];  Service: Cardiovascular;  Laterality: N/A;  . ESOPHAGOGASTRODUODENOSCOPY (EGD) WITH PROPOFOL N/A 01/25/2018   Procedure: ESOPHAGOGASTRODUODENOSCOPY (EGD) WITH PROPOFOL;  Surgeon: Jerene Bears, MD;  Location: [place];  Service: Gastroenterology;  Laterality: N/A;  . ESOPHAGOGASTRODUODENOSCOPY (EGD) WITH PROPOFOL N/A 04/10/2019    Procedure: ESOPHAGOGASTRODUODENOSCOPY (EGD) WITH PROPOFOL;  Surgeon: Jerene Bears, MD;  Location: [facility];  Service: Gastroenterology;  Laterality: N/A;  . FLEXIBLE SIGMOIDOSCOPY N/A 07/15/2017   Procedure: FLEXIBLE SIGMOIDOSCOPY;  Surgeon: Carol Ada, MD;  Location: [place];  Service: Endoscopy;  Laterality: N/A;  . HEMORRHOID BANDING    . I & D EXTREMITY Left 06/01/2017   Procedure: IRRIGATION AND DEBRIDEMENT LEFT ARM HEMATOMA WITH LIGATION OF LEFT ARM AV FISTULA;  Surgeon: Elam Dutch, MD;  Location: [place];  Service: Vascular;  Laterality: Left;  . I & D EXTREMITY Left 06/14/2017   Procedure: IRRIGATION AND DEBRIDEMENT EXTREMITY;  Surgeon: Katha Cabal, MD;  Location: [facility];  Service: Vascular;  Laterality: Left;  . INSERTION OF DIALYSIS CATHETER  05/30/2017  . INSERTION OF DIALYSIS CATHETER N/A 05/30/2017   Procedure: INSERTION OF DIALYSIS CATHETER;  Surgeon: Elam Dutch, MD;  Location: [place];  Service: Vascular;  Laterality: N/A;  . IR PARACENTESIS  08/30/2017  . IR PARACENTESIS  09/29/2017  . IR PARACENTESIS  10/28/2017  . IR PARACENTESIS  11/09/2017  . IR PARACENTESIS  11/16/2017  . IR PARACENTESIS  11/28/2017  . IR PARACENTESIS  12/01/2017  . IR PARACENTESIS  12/06/2017  . IR PARACENTESIS  01/03/2018  . IR PARACENTESIS  01/23/2018  . IR PARACENTESIS  02/07/2018  . IR PARACENTESIS  02/21/2018  . IR PARACENTESIS  03/06/2018  . IR PARACENTESIS  03/17/2018  . IR PARACENTESIS  04/04/2018  . IR PARACENTESIS  12/28/2018  . IR PARACENTESIS  01/08/2019  . IR PARACENTESIS  01/23/2019  . IR PARACENTESIS  02/01/2019  . IR PARACENTESIS  02/19/2019  . IR PARACENTESIS  03/01/2019  . IR PARACENTESIS  03/15/2019  . IR PARACENTESIS  04/03/2019  . IR PARACENTESIS  04/12/2019  . IR PARACENTESIS  05/01/2019  . IR PARACENTESIS  05/08/2019  . IR PARACENTESIS  05/24/2019  . IR PARACENTESIS  06/12/2019  . IR PARACENTESIS  07/09/2019  . IR PARACENTESIS  07/27/2019  . IR PARACENTESIS   08/09/2019  . IR PARACENTESIS  08/21/2019  . IR PARACENTESIS  09/17/2019  . IR PARACENTESIS  10/05/2019  . IR RADIOLOGIST EVAL & MGMT  02/14/2018  . IR RADIOLOGIST EVAL & MGMT  02/22/2019  . LEFT HEART CATH AND CORONARY ANGIOGRAPHY N/A 02/22/2017   Procedure: LEFT HEART CATH AND CORONARY ANGIOGRAPHY;  Surgeon: Nigel Mormon, MD;  Location: [place] [facility];  Service: Cardiovascular;  Laterality: N/A;  . LIGATION OF ARTERIOVENOUS  FISTULA Left 02/14/276   Procedure: Plication of Left Arm Arteriovenous Fistula;  Surgeon: Elam Dutch, MD;  Location: [place];  Service: Vascular;  Laterality: Left;  . POLYPECTOMY    . POLYPECTOMY  01/25/2018   Procedure: POLYPECTOMY;  Surgeon: Jerene Bears, MD;  Location: [place];  Service: Gastroenterology;;  . REVISON OF ARTERIOVENOUS FISTULA Left 10/21/8784   Procedure: PLICATION OF DISTAL ANEURYSMAL SEGEMENT OF LEFT UPPER ARM ARTERIOVENOUS FISTULA;  Surgeon: Elam Dutch, MD;  Location: [place];  Service: Vascular;  Laterality: Left;  . REVISON OF ARTERIOVENOUS FISTULA Left 7/67/2094   Procedure: Plication of Left Upper  Arm Fistula ;  Surgeon: Waynetta Sandy, MD;  Location: [place];  Service: Vascular;  Laterality: Left;  . SKIN GRAFT SPLIT THICKNESS LEG / FOOT Left    SKIN GRAFT SPLIT THICKNESS LEFT ARM DONOR SITE: LEFT ANTERIOR THIGH  . SKIN SPLIT GRAFT Left 07/04/2017   Procedure: SKIN GRAFT SPLIT THICKNESS LEFT ARM DONOR SITE: LEFT ANTERIOR THIGH;  Surgeon: Elam Dutch, MD;  Location: [place];  Service: Vascular;  Laterality: Left;  . THROMBECTOMY W/ EMBOLECTOMY Left 06/05/2017   Procedure: EXPLORATION OF LEFT ARM FOR BLEEDING; OVERSEWED PROXIMAL FISTULA;  Surgeon: Angelia Mould, MD;  Location: [place];  Service: Vascular;  Laterality: Left;  . WOUND EXPLORATION Left 06/03/2017   Procedure: WOUND EXPLORATION WITH WOUND VAC APPLICATION TO LEFT ARM;  Surgeon: Angelia Mould, MD;  Location: [facility];  Service: Vascular;   Laterality: Left;   Family History  Problem Relation Age of Onset  . Heart disease Mother   . Lung cancer Mother   . Heart disease Father   . Malignant hyperthermia Father   . COPD Father   . Throat cancer Sister   . Esophageal cancer Sister   . Hypertension Other   . COPD Other   . Colon cancer Neg Hx   . Colon polyps Neg Hx   . Rectal cancer Neg Hx   . Stomach cancer Neg Hx    Social History:  reports that he has been smoking cigarettes. He started smoking about 46 years ago. He has a 21.50 pack-year smoking history. He has never used smokeless tobacco. He reports previous alcohol use. He reports previous drug use. Drugs: Marijuana and Cocaine. Allergies  Allergen Reactions  . Tramadol Itching and Other (See Comments)  . Morphine And Related Other (See Comments)    Stomach pain  . Pollen Extract     Other reaction(s): Sneezing (finding)  . Acetaminophen Nausea Only    Stomach ache Other reaction(s): Other (See Comments) Stomach ache  . Aspirin Other (See Comments) and Itching    STOMACH PAIN Other reaction(s): Unknown STOMACH PAIN  . Clonidine Derivatives Itching   Prior to Admission medications   Medication Sig Start Date End Date Taking? Authorizing Provider  albuterol (VENTOLIN HFA) 108 (90 Base) MCG/ACT inhaler Inhale 2 puffs into the lungs every 6 (six) hours as needed for wheezing or shortness of breath.   Yes [provider]  budesonide-formoterol (SYMBICORT) 160-4.5 MCG/ACT inhaler Inhale 2 puffs into the lungs 2 (two) times daily.   Yes [provider]  diltiazem (TIAZAC) 240 MG 24 hr capsule Take 240 mg by mouth daily. 08/16/19  Yes [provider]  hydrALAZINE (APRESOLINE) 100 MG tablet Take 100 mg by mouth 2 (two) times daily.   Yes [provider]  hydrOXYzine (ATARAX/VISTARIL) 25 MG tablet Take 25 mg by mouth 2 (two) times daily as needed for itching.  08/21/19  Yes [provider]  lactulose (CHRONULAC) 10 GM/15ML  solution Take 45 mLs (30 g total) by mouth daily as needed for mild constipation. 10/01/19  Yes [name], MD  metoprolol tartrate (LOPRESSOR) 100 MG tablet Take 1 tablet (100 mg total) by mouth 2 (two) times daily. 06/04/19  Yes Al Decant, MD  montelukast (SINGULAIR) 10 MG tablet Take 10 mg by mouth every evening.    Yes [provider]  ondansetron (ZOFRAN) 4 MG tablet Take 4 mg by mouth every 4 (four) hours as needed for nausea or vomiting. 09/18/19  Yes [provider]  Oxycodone HCl 10 MG TABS Take 10 mg by mouth 3 (three) times daily as needed (pain).  08/25/19  Yes [provider]  pantoprazole (PROTONIX) 40 MG tablet Take 40 mg by mouth daily.   Yes [provider]  sevelamer carbonate (RENVELA) 800 MG tablet Take 1,600-3,200 mg by mouth See admin instructions. Take 4 tablets (3200 mg) by mouth 3 times daily with meals and 2 tablets (1600 mg) with snacks   Yes [provider]  zolpidem (AMBIEN) 10 MG tablet Take 10 mg by mouth at bedtime as needed for sleep.   Yes [provider]   Current Facility-Administered Medications  Medication Dose Route Frequency Provider Last Rate Last Admin  . albuterol (PROVENTIL) (2.5 MG/3ML) 0.083% nebulizer solution 2.5 mg  2.5 mg Inhalation Q6H PRN Darliss Cheney, MD      . calcitRIOL (ROCALTROL) capsule 1.75 mcg  1.75 mcg Oral Q M,W,F-HD Ernest Haber, PA-C      . Darbepoetin Alfa (ARANESP) injection 100 mcg  100 mcg Intravenous Q Mon-HD Zeyfang, David, PA-C      . diltiazem [facility]) 24 hr capsule 240 mg  240 mg Oral Daily Pahwani, Einar Grad, MD      . Derrill Memo ON 10/24/2019] ferric gluconate (NULECIT) 62.5 mg in sodium chloride 0.9 % 100 mL IVPB  62.5 mg Intravenous Q Wed-HD Ernest Haber, PA-C      . HYDROmorphone (DILAUDID) injection 0.5 mg  0.5 mg Intravenous Q4H PRN Shela Leff, MD   0.5 mg at 10/22/19 1100  . lactulose (CHRONULAC) 10 GM/15ML solution 30 g  30 g Oral Daily PRN Darliss Cheney,  MD      . metoprolol tartrate (LOPRESSOR) injection 5 mg  5 mg Intravenous Q5 min PRN Shela Leff, MD   5 mg at 10/22/19 0119  . metoprolol tartrate (LOPRESSOR) tablet 100 mg  100 mg Oral BID Darliss Cheney, MD   100 mg at 10/22/19 1051  . montelukast (SINGULAIR) tablet 10 mg  10 mg Oral QPM Pahwani, Ravi, MD      . nicotine (NICODERM CQ - dosed in mg/24 hours) patch 14 mg  14 mg Transdermal Daily Shela Leff, MD      . pantoprazole (PROTONIX) EC tablet 40 mg  40 mg Oral Daily Darliss Cheney, MD   40 mg at 10/22/19 1050  . sevelamer carbonate (RENVELA) tablet 1,600 mg  1,600 mg Oral With snacks Darliss Cheney, MD      . sevelamer carbonate (RENVELA) tablet 3,200 mg  3,200 mg Oral TID WC Pahwani, Ravi, MD      . white petrolatum (VASELINE) gel            Labs: Basic Metabolic Panel: Recent Labs  Lab 10/20/19 0743 10/21/19 0314 10/22/19 0214  NA 138 138 138  K 4.8 4.6 5.6*  CL 94* 95* 94*  CO2 28 25 22   GLUCOSE 98 92 92  BUN 23* 33* 43*  CREATININE 4.30* 5.30* 6.54*  CALCIUM 9.4 9.5 9.5   Liver Function Tests: Recent Labs  Lab 10/20/19 0743 10/21/19 0314 10/22/19 0214  AST 29 26 38  ALT 18 17 18   ALKPHOS 164* 158* 161*  BILITOT 0.9 1.1 1.0  PROT 6.3* 6.4* 6.2*  ALBUMIN 2.8* 2.7* 2.7*   Recent Labs  Lab 10/20/19 0743 10/21/19 0314  LIPASE 22 19   No results for input(s): AMMONIA in the last 168 hours. CBC: Recent Labs  Lab 10/20/19 0743 10/21/19 0314 10/22/19 0214  WBC 8.1 8.3 8.7  NEUTROABS 5.8 5.8 5.8  HGB 8.1* 8.1* 8.6*  HCT 24.1* 22.4* 24.4*  MCV 78.5* 82.7 75.8*  PLT 202 198 234   Cardiac Enzymes: No results for input(s): CKTOTAL, CKMB, CKMBINDEX, TROPONINI in the last 168 hours. CBG: No results for input(s): GLUCAP in the last 168 hours. Iron Studies: No results for input(s): IRON, TIBC, TRANSFERRIN, FERRITIN in the last 72 hours. Studies/Results: US Paracentesis  Result Date: 10/21/2019 INDICATION: Patient with history of hepatitis C  cirrhosis end-stage renal disease with recurrent ascites request for therapeutic and diagnostic paracentesis EXAM: ULTRASOUND GUIDED THERAPEUTIC AND DIAGNOSTIC PARACENTESIS MEDICATIONS: Lidocaine 1% 10 mL COMPLICATIONS: None immediate. PROCEDURE: Informed written consent was obtained from the patient after a discussion of the risks, benefits and alternatives to treatment. A timeout was performed prior to the initiation of the procedure. Initial ultrasound scanning demonstrates a small amount of ascites within the right lower abdominal quadrant. The right lower abdomen was prepped and draped in the usual sterile fashion. 1% lidocaine was used for local anesthesia. Following this, a 6 Fr Safe-T-Centesis catheter was introduced. An ultrasound image was saved for documentation purposes. The paracentesis was performed. The catheter was removed and a dressing was applied. The patient tolerated the procedure well without immediate post procedural complication. FINDINGS: A total of approximately 2 L of amber color fluid was removed. Samples were sent to the laboratory as requested by the clinical team. IMPRESSION: Successful ultrasound-guided therapeutic and diagnostic paracentesis yielding 2 liters of peritoneal fluid. Read by Rushie Nyhan NP Electronically Signed   By: Lucrezia Europe M.D.   On: 10/21/2019 13:51   DG Chest Portable 1 View  Result Date: 10/21/2019 CLINICAL DATA:  Abdominal painsob EXAM: PORTABLE CHEST 1 VIEW COMPARISON:  Four hundred ten 21 FINDINGS: Stable enlarged cardiac silhouette. Perihilar airspace disease slightly improved. Elevation LEFT hemidiaphragm. No pleural fluid no pneumothorax. Vascular calcification in the RIGHT subclavian artery. IMPRESSION: Slight improvement in pulmonary edema pattern. Stable cardiomegaly Electronically Signed   By: Suzy Bouchard M.D.   On: 10/21/2019 04:06    ROS: As per HPI otherwise negative.   Physical Exam: Vitals:   10/22/19 1430 10/22/19 1445  10/22/19 1515 10/22/19 1530  BP: 120/79 (!) 154/79 (!) 154/108 (!) 141/82  Pulse: (!) 120 62 73 74  Resp: 20 (!) 21    Temp:      TempSrc:      SpO2:      Weight:      Height:         General: Thin, chronically ill appearing male in no acute distress. Head: Normocephalic, atraumatic, sclera non-icteric, mucus membranes are moist Neck: Supple. JVD not elevated. Lungs: Clear bilaterally to auscultation without wheezes, rales, or rhonchi. Breathing is unlabored. Heart: RRR with S1 S2. No murmurs, rubs, or gallops appreciated. Abdomen: Soft, tender upper quadrants, non-distended with normoactive bowel sounds. No rebound/guarding. No obvious abdominal masses. M-S:  Strength and tone appear normal for age. Lower extremities:without edema or ischemic changes, no open wounds  Neuro: Alert and oriented X 3. Moves all extremities spontaneously. Psych:  Responds to questions appropriately with a normal affect. Dialysis Access: R AVG + bruit  Dialysis Orders: [place] M[facility] 4 hrs 180NRe 450/800 64.5 kg 2.0K/2.0 Ca AVG -No Heparin -Venofer 50 mg IV weekly -Calcitriol 1.75 mcg PO TIW  Assessment/Plan: 1.  Abdominal pain/ascites-per primary 2.  ESRD -  Continue MWF schedule. HD today on schedule. SCr 6.54 BUN 43. No evidence of uremia despite truncated treatments.  3.  Hypertension/volume  -  variably controlled HTN. Has been restarted on metoprolol and dilt per primary. No evidence of volume overload by exam. Has mild pulmonary edema on admission which has resolved with HD. UF as tolerated.  4.  Anemia  - HGB 8.6. Getting Aranesp 100 mcg IV with HD today. Follow HGB.  5.  Metabolic bone disease -  Recent OP labs at goal. Continue binders, VDRA  6.  Nutrition - Chronically low albumin. Add prostat, renal vits although he may refuse 7. Hepatitis C with cirrhosis-per primary 8. PAF-not a candidate for anticoagulation. On CCB.   Rita H. Owens Shark, NP-C 10/22/2019, 3:46 PM  [employer] [phone]  I have seen and examined this patient and agree with plan and assessment in the above note with renal recommendations/intervention highlighted. Already wanting to sign off of dialysis early.   Governor Rooks Valdemar Mcclenahan,MD 10/22/2019 4:44 PM

## 2019-10-22 NOTE — Progress Notes (Signed)
RN called [facility] just to make them aware of pt's status and heart rate and doctor's orders. Will continue to monitor.   Eleanora Neighbor, RN

## 2019-10-23 LAB — COMPREHENSIVE METABOLIC PANEL
ALT: 16 U/L (ref 0–44)
AST: 36 U/L (ref 15–41)
Albumin: 2.5 g/dL — ABNORMAL LOW (ref 3.5–5.0)
Alkaline Phosphatase: 158 U/L — ABNORMAL HIGH (ref 38–126)
Anion gap: 15 (ref 5–15)
BUN: 29 mg/dL — ABNORMAL HIGH (ref 6–20)
CO2: 25 mmol/L (ref 22–32)
Calcium: 9.1 mg/dL (ref 8.9–10.3)
Chloride: 97 mmol/L — ABNORMAL LOW (ref 98–111)
Creatinine, Ser: 4.89 mg/dL — ABNORMAL HIGH (ref 0.61–1.24)
GFR calc Af Amer: 14 mL/min — ABNORMAL LOW (ref >60)
GFR calc non Af Amer: 12 mL/min — ABNORMAL LOW (ref >60)
Glucose, Bld: 43 mg/dL — CL (ref 70–99)
Potassium: 5 mmol/L (ref 3.5–5.1)
Sodium: 137 mmol/L (ref 135–145)
Total Bilirubin: 1.3 mg/dL — ABNORMAL HIGH (ref 0.3–1.2)
Total Protein: 6 g/dL — ABNORMAL LOW (ref 6.5–8.1)

## 2019-10-23 LAB — CBC WITH DIFFERENTIAL/PLATELET
Abs Immature Granulocytes: 0.03 10*3/uL (ref 0.00–0.07)
Basophils Absolute: 0.1 10*3/uL (ref 0.0–0.1)
Basophils Relative: 2 %
Eosinophils Absolute: 0.2 10*3/uL (ref 0.0–0.5)
Eosinophils Relative: 2 %
HCT: 22.3 % — ABNORMAL LOW (ref 39.0–52.0)
Hemoglobin: 8.1 g/dL — ABNORMAL LOW (ref 13.0–17.0)
Immature Granulocytes: 1 %
Lymphocytes Relative: 23 %
Lymphs Abs: 1.5 10*3/uL (ref 0.7–4.0)
MCH: 28.9 pg (ref 26.0–34.0)
MCHC: 36.3 g/dL — ABNORMAL HIGH (ref 30.0–36.0)
MCV: 79.6 fL — ABNORMAL LOW (ref 80.0–100.0)
Monocytes Absolute: 0.6 10*3/uL (ref 0.1–1.0)
Monocytes Relative: 10 %
Neutro Abs: 4 10*3/uL (ref 1.7–7.7)
Neutrophils Relative %: 62 %
Platelets: 195 10*3/uL (ref 150–400)
RBC: 2.8 MIL/uL — ABNORMAL LOW (ref 4.22–5.81)
RDW: 16 % — ABNORMAL HIGH (ref 11.5–15.5)
WBC: 6.4 10*3/uL (ref 4.0–10.5)
nRBC: 0.6 % — ABNORMAL HIGH (ref 0.0–0.2)

## 2019-10-23 LAB — PH, BODY FLUID: pH, Body Fluid: 7.5

## 2019-10-23 LAB — GLUCOSE, CAPILLARY: Glucose-Capillary: 103 mg/dL — ABNORMAL HIGH (ref 70–99)

## 2019-10-23 MED ORDER — DEXTROSE 50 % IV SOLN
INTRAVENOUS | Status: AC
  Filled 2019-10-23: qty 50

## 2019-10-23 MED ORDER — DEXTROSE 50 % IV SOLN
25.0000 g | INTRAVENOUS | Status: AC
  Administered 2019-10-23: 25 g via INTRAVENOUS

## 2019-10-23 NOTE — Discharge Instructions (Signed)
Ascites  Ascites is a collection of too much fluid in the abdomen. Ascites can range from mild to severe. If ascites is not treated, it can get worse. What are the causes? This condition may be caused by:  A liver condition called cirrhosis. This is the most common cause of ascites.  Long-term (chronic) or alcoholic hepatitis.  Infection or inflammation in the abdomen.  Cancer in the abdomen.  Heart failure.  Kidney disease.  Inflammation of the pancreas.  Clots in the veins of the liver. What are the signs or symptoms? Symptoms of this condition include:  A feeling of fullness in the abdomen. This is common.  An increase in the size of the abdomen or waist.  Swelling in the legs.  Swelling of the scrotum (in men).  Difficulty breathing.  Pain in the abdomen.  Sudden weight gain. If the condition is mild, you may not have symptoms. How is this diagnosed? This condition is diagnosed based on your medical history and a physical exam. Your health care provider may order imaging tests, such as an ultrasound or CT scan of your abdomen. How is this treated? Treatment for this condition depends on the cause of the ascites. It may include:  Taking a pill to make you urinate. This is called a water pill (diuretic pill).  Strictly reducing your salt (sodium) intake. Salt can cause extra fluid to be kept (retained) in the body, and this makes ascites worse.  Having a procedure to remove fluid from your abdomen (paracentesis).  Having a procedure that connects two of the major veins within your liver and relieves pressure on your liver. This is called a TIPS procedure (transjugular intrahepatic portosystemic shunt procedure).  Placement of a drainage catheter (peritoneovenous shunt) to manage the extra fluid in the abdomen. Ascites may go away or improve when the condition that caused it is treated. Follow these instructions at home:  Keep track of your weight. To do this,  weigh yourself at the same time every day and write down your weight.  Keep track of how much you drink and any changes in how much or how often you urinate.  Follow any instructions that your health care provider gives you about how much to drink.  Try not to eat salty (high-sodium) foods.  Take over-the-counter and prescription medicines only as told by your health care provider.  Keep all follow-up visits as told by your health care provider. This is important.  Report any changes in your health to your health care provider, especially if you develop new symptoms or your symptoms get worse. Contact a health care provider if:  You gain more than 3 lb (1.36 kg) in 3 days.  Your waist size increases.  You have new swelling in your legs.  The swelling in your legs gets worse. Get help right away if:  You have a fever.  You are confused.  You have new or worsening breathing trouble.  You have new or worsening pain in your abdomen.  You have new or worsening swelling in the scrotum (in men). Summary  Ascites is a collection of too much fluid in the abdomen.  Ascites may be caused by various conditions, such as cirrhosis, hepatitis, cancer, or congestive heart failure.  Symptoms may include swelling of the abdomen and other areas due to extra fluid in the body.  Treatments may involve dietary changes, medicines, or procedures. This information is not intended to replace advice given to you by your health care   provider. Make sure you discuss any questions you have with your health care provider. Document Revised: 05/30/2018 Document Reviewed: 03/10/2017 Elsevier Patient Education  2020 [employer]  

## 2019-10-23 NOTE — Progress Notes (Signed)
Hypoglycemic Event  CBG: Results for [name], [name] (MRN [id number]) as of 10/23/2019 05:15  Ref. Range 10/21/2019 07:15 10/21/2019 07:15 10/21/2019 13:00 10/21/2019 13:46 10/21/2019 13:53 10/22/2019 02:14 10/22/2019 11:28 10/22/2019 11:28 10/22/2019 11:49 10/23/2019 03:43  Glucose Latest Ref Range: 70 - 99 mg/dL      92    43 (LL)    Treatment: D50 25 mL (12.5 gm)  Symptoms: None  Follow-up CBG: Time:0541 CBG Result:103  Possible Reasons for Event: Inadequate meal intake  Comments/MD notified:    Viviano Simas

## 2019-10-23 NOTE — Discharge Summary (Signed)
Physician Discharge Summary  [name] BHA:[id number] DOB: June 08, 1961 DOA: 10/21/2019  PCP: [name], FNP  Admit date: 10/21/2019 Discharge date: 10/23/2019  Admitted From: Home Disposition: Home  Recommendations for Outpatient Follow-up:  1. Follow up with PCP in 1-2 weeks 2. Follow-up with outpatient hemodialysis as scheduled 3. Please obtain BMP/CBC in one week 4. Please follow up on the following pending results:  Home Health: None Equipment/Devices: None  Discharge Condition: Stable CODE STATUS: DNR Diet recommendation: Renal  Subjective: Seen and examined.  Feels much better.  No abdominal pain or any other complaint.  Agreeable to go home.  Brief/Interim Summary: Joshua Barris Hoskinsis a 59 y.o.malewith medical history significant ofESRD on HD MWF, liver cirrhosis with recurrent ascites requiring serial paracentesis, chronic hep B, hep C, paroxysmal A. fib not on anticoagulation, hypertension, chronic pain syndrome, CAD status post PCI, COPD, hypertension, history of cocaine/marijuana abuse presenting with complaints of abdominal pain. Patient was seen in the ED yesterday for the same complaint and his abdominal pain was felt to be related to mild ascites. He was discharged with plan to follow-up with GI however he presented back to ED on late night of 10/21/19 with generalized worsening abdominal pain which was likely going on for 3 months.  He did not have any nausea vomiting or diarrhea.  No fever or chills.He was diagnosed to have ascites causing the abdominal pain.  He was not suspected to have SBP so no antibiotics were given.  IR was consulted and 2 L of amber-colored fluid was removed via paracentesis.  Fluid analysis does not seem to be infectious.  Patient did have some uncontrolled/fast heart rate and later it was found out that his home medications were not resumed.  For this reason, his home regimen of blocker and beta-blocker were resumed and that  helped control his atrial fibrillation which he has known history of but he was not on anticoagulation as he was not a good candidate for this in the past.  Patient was given IV pain medications for pain control.  When seen today, he was feeling much better.  Heart rate controlled.  He also underwent his regular dialysis yesterday with nephrology.  He was discharged in a stable condition.  No changes in medications.  Discharge Diagnoses:  Principal Problem:   Abdominal pain Active Problems:   COPD GOLD 0 with flattening on inps f/v    Essential hypertension   Ascites   A-fib [facility])    Discharge Instructions   Allergies as of 10/23/2019      Reactions   Tramadol Itching, Other (See Comments)   Morphine And Related Other (See Comments)   Stomach pain   Pollen Extract    Other reaction(s): Sneezing (finding)   Acetaminophen Nausea Only   Stomach ache Other reaction(s): Other (See Comments) Stomach ache   Aspirin Other (See Comments), Itching   STOMACH PAIN Other reaction(s): Unknown STOMACH PAIN   Clonidine Derivatives Itching      Medication List    TAKE these medications   budesonide-formoterol 160-4.5 MCG/ACT inhaler Commonly known as: SYMBICORT Inhale 2 puffs into the lungs 2 (two) times daily.   diltiazem 240 MG 24 hr capsule Commonly known as: TIAZAC Take 240 mg by mouth daily.   hydrALAZINE 100 MG tablet Commonly known as: APRESOLINE Take 100 mg by mouth 2 (two) times daily.   hydrOXYzine 25 MG tablet Commonly known as: ATARAX/VISTARIL Take 25 mg by mouth 2 (two) times daily as needed for  itching.   lactulose 10 GM/15ML solution Commonly known as: CHRONULAC Take 45 mLs (30 g total) by mouth daily as needed for mild constipation.   metoprolol tartrate 100 MG tablet Commonly known as: LOPRESSOR Take 1 tablet (100 mg total) by mouth 2 (two) times daily.   montelukast 10 MG tablet Commonly known as: SINGULAIR Take 10 mg by mouth every evening.    ondansetron 4 MG tablet Commonly known as: ZOFRAN Take 4 mg by mouth every 4 (four) hours as needed for nausea or vomiting.   Oxycodone HCl 10 MG Tabs Take 10 mg by mouth 3 (three) times daily as needed (pain).   pantoprazole 40 MG tablet Commonly known as: PROTONIX Take 40 mg by mouth daily.   sevelamer carbonate 800 MG tablet Commonly known as: RENVELA Take 1,600-3,200 mg by mouth See admin instructions. Take 4 tablets (3200 mg) by mouth 3 times daily with meals and 2 tablets (1600 mg) with snacks   Ventolin HFA 108 (90 Base) MCG/ACT inhaler Generic drug: albuterol Inhale 2 puffs into the lungs every 6 (six) hours as needed for wheezing or shortness of breath.   zolpidem 10 MG tablet Commonly known as: AMBIEN Take 10 mg by mouth at bedtime as needed for sleep.      Follow-up Information    [name], FNP Follow up in 1 week(s).   Specialty: Family Medicine Contact information: [place] [address] [phone]          Allergies  Allergen Reactions  . Tramadol Itching and Other (See Comments)  . Morphine And Related Other (See Comments)    Stomach pain  . Pollen Extract     Other reaction(s): Sneezing (finding)  . Acetaminophen Nausea Only    Stomach ache Other reaction(s): Other (See Comments) Stomach ache  . Aspirin Other (See Comments) and Itching    STOMACH PAIN Other reaction(s): Unknown STOMACH PAIN  . Clonidine Derivatives Itching    Consultations: Nephrology   Procedures/Studies: US Paracentesis  Result Date: 10/21/2019 INDICATION: Patient with history of hepatitis C cirrhosis end-stage renal disease with recurrent ascites request for therapeutic and diagnostic paracentesis EXAM: ULTRASOUND GUIDED THERAPEUTIC AND DIAGNOSTIC PARACENTESIS MEDICATIONS: Lidocaine 1% 10 mL COMPLICATIONS: None immediate. PROCEDURE: Informed written consent was obtained from the patient after a discussion of the risks, benefits and alternatives  to treatment. A timeout was performed prior to the initiation of the procedure. Initial ultrasound scanning demonstrates a small amount of ascites within the right lower abdominal quadrant. The right lower abdomen was prepped and draped in the usual sterile fashion. 1% lidocaine was used for local anesthesia. Following this, a 6 Fr Safe-T-Centesis catheter was introduced. An ultrasound image was saved for documentation purposes. The paracentesis was performed. The catheter was removed and a dressing was applied. The patient tolerated the procedure well without immediate post procedural complication. FINDINGS: A total of approximately 2 L of amber color fluid was removed. Samples were sent to the laboratory as requested by the clinical team. IMPRESSION: Successful ultrasound-guided therapeutic and diagnostic paracentesis yielding 2 liters of peritoneal fluid. Read by Rushie Nyhan NP Electronically Signed   By: Lucrezia Europe M.D.   On: 10/21/2019 13:51   DG Chest Portable 1 View  Result Date: 10/21/2019 CLINICAL DATA:  Abdominal painsob EXAM: PORTABLE CHEST 1 VIEW COMPARISON:  Four hundred ten 21 FINDINGS: Stable enlarged cardiac silhouette. Perihilar airspace disease slightly improved. Elevation LEFT hemidiaphragm. No pleural fluid no pneumothorax. Vascular calcification in the RIGHT subclavian  artery. IMPRESSION: Slight improvement in pulmonary edema pattern. Stable cardiomegaly Electronically Signed   By: Suzy Bouchard M.D.   On: 10/21/2019 04:06   DG Chest Portable 1 View  Result Date: 10/20/2019 CLINICAL DATA:  Abdominal pain. EXAM: PORTABLE CHEST 1 VIEW COMPARISON:  Radiograph 10/04/2019 FINDINGS: Enlarged cardiac silhouette similar prior. There is new fine airspace disease in the RIGHT perihilar lung. Mild airspace disease in the LEFT lung. No focal consolidation. No pneumothorax. IMPRESSION: Cardiomegaly and fine perihilar airspace suggest pulmonary edema. Pneumonia less favored. Airspace disease  is asymmetric, greater on the RIGHT. Electronically Signed   By: Suzy Bouchard M.D.   On: 10/20/2019 07:57   DG Chest Portable 1 View  Result Date: 10/04/2019 CLINICAL DATA:  Hypoxia. Additional history provided: Patient from home via EMS with lower abdominal pain, had dialysis yesterday, reportedly COVID positive 316 EXAM: PORTABLE CHEST 1 VIEW COMPARISON:  Chest radiograph 09/30/2019 FINDINGS: Unchanged cardiomegaly. Aortic atherosclerosis. Chronic elevation of the left hemidiaphragm. Similar appearance of patchy interstitial opacities most notable within the bilateral mid lung fields and left lung base. No sizable pleural effusion or evidence of pneumothorax. No acute bony abnormality. IMPRESSION: Similar appearance of patchy interstitial opacities most notable within the bilateral mid lung fields and left lung base. Findings may reflect asymmetric edema or sequela of atypical/viral infection. Unchanged cardiomegaly.  Aortic atherosclerosis. Electronically Signed   By: Kellie Simmering DO   On: 10/04/2019 11:33   DG Chest Port 1 View  Result Date: 09/30/2019 CLINICAL DATA:  Shortness of breath.  Abdominal distention. EXAM: PORTABLE CHEST 1 VIEW COMPARISON:  September 27, 2019 FINDINGS: Patchy interstitial and ground-glass opacities are seen in the lungs, right greater than left. Cardiomegaly. The hila and mediastinum are normal. No pneumothorax. No other acute abnormalities. IMPRESSION: Patchy interstitial and ground-glass opacities may represent asymmetric edema versus atypical infection. No other acute abnormalities. Electronically Signed   By: Dorise Bullion III M.D   On: 09/30/2019 08:43   DG Chest Port 1 View  Result Date: 09/27/2019 CLINICAL DATA:  Acute respiratory failure. EXAM: PORTABLE CHEST 1 VIEW COMPARISON:  09/26/2019. FINDINGS: Endotracheal tube, NG tube in stable position. Cardiomegaly. Persistent bilateral interstitial prominence. Findings suggest CHF with interstitial edema. Pneumonitis  cannot be excluded. Low lung volumes with bibasilar atelectasis. Small left pleural effusion cannot be excluded. No pneumothorax. Aortic, bilateral subclavian, bilateral carotid atherosclerotic vascular calcification. IMPRESSION: 1.  Endotracheal tube and NG tube in stable position. 2. Cardiomegaly persistent diffuse pulmonary interstitial prominence without significant change from prior exam. Findings suggest CHF. Small left pleural effusion cannot be excluded. 3.  Low lung volumes with bibasilar atelectasis. 4. Aortic, bilateral subclavian, bilateral carotid atherosclerotic vascular disease. Electronically Signed   By: Marcello Moores  Register   On: 09/27/2019 08:18   DG Chest Port 1 View  Result Date: 09/26/2019 CLINICAL DATA:  COVID-19 virus infection. Respiratory failure. Endotracheal intubation. EXAM: PORTABLE CHEST 1 VIEW COMPARISON:  09/25/2019 FINDINGS: Endotracheal tube and orogastric tube are in appropriate position. No pneumothorax visualized. Cardiomegaly stable. Aortic atherosclerosis incidentally noted. Elevation of left hemidiaphragm is again noted, with left basilar atelectasis. Heterogeneous bilateral airspace disease shows mild worsening in central right upper lobe. No other new or worsening areas of airspace disease identified. IMPRESSION: 1. Bilateral airspace disease, with mild worsening in central right upper lobe. 2. Persistent elevation of left hemidiaphragm and left basilar atelectasis. 3. Endotracheal tube and orogastric tube in appropriate position. Electronically Signed   By: Marlaine Hind M.D.   On: 09/26/2019 09:55  DG Chest Port 1 View  Result Date: 09/25/2019 CLINICAL DATA:  Endotracheal tube and enteric tube placement. EXAM: PORTABLE CHEST 1 VIEW COMPARISON:  Earlier on the same date FINDINGS: Interval placement of an endotracheal 2, its tip projecting 6.5 cm above the carina. Interval placement of an enteric tube which traverses the left upper abdominal quadrant in the expected  location of the gastric lumen, its tip not imaged. Stable moderate cardiomegaly with vascular congestion, moderate interstitial edema, and diffuse interstitial and patchy airspace opacities in both lungs. Thoracic aorta and bilateral subclavian and axillary coarse atherosclerotic calcifications. Telemetry leads. Probable trace left pleural effusion. No pneumothorax. IMPRESSION: Interval placement of an endotracheal tube and enteric tube; a 3 cm advancement of the endotracheal tube could be considered. Stable moderate cardiomegaly with pulmonary edema, trace left pleural effusion and diffuse pulmonary disease which could be secondary to edema or infectious. Aorta and bilateral subclavian and axillary coarse atherosclerotic calcifications. Electronically Signed   By: Revonda Humphrey   On: 09/25/2019 23:34   DG Chest Port 1 View  Result Date: 09/25/2019 CLINICAL DATA:  59 year old male with cough and weakness. EXAM: PORTABLE CHEST 1 VIEW COMPARISON:  Chest radiograph dated 09/23/2019. FINDINGS: There is diffuse interstitial coarsening and mild chronic bronchitic changes. Faint bilateral mid lung field densities concerning for developing infiltrate. Clinical correlation is recommended. No large pleural effusion or pneumothorax. There is stable cardiomegaly. Atherosclerotic calcification of the aorta. No acute osseous pathology. IMPRESSION: Bilateral mid lung field densities concerning for developing infiltrate. Clinical correlation and follow-up recommended. Electronically Signed   By: Anner Crete M.D.   On: 09/25/2019 21:22   DG Chest Port 1 View  Result Date: 09/23/2019 CLINICAL DATA:  Shortness of breath and abdominal distention. EXAM: PORTABLE CHEST 1 VIEW COMPARISON:  August 10, 2019 FINDINGS: Mild chronic appearing increased interstitial lung markings are again seen. Very mild hazy infiltrates are also seen along the lateral aspect of the mid lung fields, right greater than left. This represents a new  finding when compared to the prior study. There is no evidence of a pleural effusion or pneumothorax. There is marked severity cardiomegaly which is unchanged in size. Marked severity calcification of the aortic arch is seen. The visualized skeletal structures are unremarkable. IMPRESSION: 1. Chronic appearing increased interstitial lung markings with mild hazy infiltrates seen along the lateral aspects of the mid lung fields, right greater than left. Electronically Signed   By: Virgina Norfolk M.D.   On: 09/23/2019 22:04   DG Abd Portable 1V  Result Date: 09/30/2019 CLINICAL DATA:  Abdominal pain. EXAM: PORTABLE ABDOMEN - 1 VIEW COMPARISON:  None. FINDINGS: Decubitus films are identified. No free air identified. Air-filled loops of large and small bowel are normal in caliber with no evidence of obstruction. IMPRESSION: No evidence of free air.  No evidence of obstruction identified. Electronically Signed   By: Dorise Bullion III M.D   On: 09/30/2019 08:45   ECHOCARDIOGRAM COMPLETE  Result Date: 10/04/2019    ECHOCARDIOGRAM REPORT   Patient Name:   SHONTE SODERLUND Date of Exam: 10/04/2019 Medical Rec #:  [id number]              Height:       69.0 in Accession #:    [id number]             Weight:       146.2 lb Date of Birth:  [birth year]  BSA:          1.808 m Patient Age:    85 years               BP:           125/70 mmHg Patient Gender: M                      HR:           71 bpm. Exam Location:  Inpatient Procedure: 2D Echo Indications:    Dyspnea 786.09 / R06.00  History:        Patient has prior history of Echocardiogram examinations, most                 recent 08/30/2017. COPD, Arrythmias:Atrial Flutter and LBBB,                 Signs/Symptoms:Shortness of Breath; Risk Factors:Current Smoker                 and Hypertension. Alcohol abuse                 Fluid overload                 ESRD.  Sonographer:    Vikki Ports Turrentine Referring Phys: [id number] [employer]  1. Low  normal LV systolic function; severe LVH (myocardium with specked appearance; consider amyloid); mild to moderate AS (mean gradient 11 mmHg and AVA 1.3 cm2); mild AI; thickened MV with mild to moderate MS (mean gradient 6 mmHg and MVA 1.2-2 cm2; pt in atrial fibrillation at time of study); severe biatrial enlargement; mild RVE; severe RV dysfunction; severe TR with severe pulmonary hypertension.  2. Left ventricular ejection fraction, by estimation, is 50 to 55%. The left ventricle has low normal function. The left ventricle has no regional wall motion abnormalities. There is severe left ventricular hypertrophy. Left ventricular diastolic function could not be evaluated.  3. Right ventricular systolic function is severely reduced. The right ventricular size is mildly enlarged. There is severely elevated pulmonary artery systolic pressure.  4. Left atrial size was severely dilated.  5. Right atrial size was severely dilated.  6. Large pleural effusion in the left lateral region.  7. The mitral valve is normal in structure. Mild mitral valve regurgitation. Mild to moderate mitral stenosis.  8. Tricuspid valve regurgitation is severe.  9. The aortic valve is normal in structure. Aortic valve regurgitation is mild. Mild to moderate aortic valve stenosis. 10. The inferior vena cava is dilated in size with <50% respiratory variability, suggesting right atrial pressure of 15 mmHg. FINDINGS  Left Ventricle: Left ventricular ejection fraction, by estimation, is 50 to 55%. The left ventricle has low normal function. The left ventricle has no regional wall motion abnormalities. The left ventricular internal cavity size was normal in size. There is severe left ventricular hypertrophy. Left ventricular diastolic function could not be evaluated due to atrial fibrillation. Left ventricular diastolic function could not be evaluated. Right Ventricle: The right ventricular size is mildly enlarged. Right ventricular systolic function  is severely reduced. There is severely elevated pulmonary artery systolic pressure. The tricuspid regurgitant velocity is 4.64 m/s, and with an assumed right atrial pressure of 15 mmHg, the estimated right ventricular systolic pressure is 778.2 mmHg. Left Atrium: Left atrial size was severely dilated. Right Atrium: Right atrial size was severely dilated. Pericardium: A small pericardial effusion is present. Mitral Valve: There is moderate thickening of  the mitral valve leaflet(s). Severe mitral annular calcification. Mild mitral valve regurgitation. Mild to moderate mitral valve stenosis. Tricuspid Valve: The tricuspid valve is normal in structure. Tricuspid valve regurgitation is severe. No evidence of tricuspid stenosis. Aortic Valve: The aortic valve is normal in structure. Aortic valve regurgitation is mild. Mild to moderate aortic stenosis is present. Aortic valve mean gradient measures 10.0 mmHg. Aortic valve peak gradient measures 16.6 mmHg. Aortic valve area, by VTI measures 1.28 cm. Pulmonic Valve: The pulmonic valve was normal in structure. Pulmonic valve regurgitation is trivial. No evidence of pulmonic stenosis. Aorta: The aortic root is normal in size and structure. Venous: The inferior vena cava is dilated in size with less than 50% respiratory variability, suggesting right atrial pressure of 15 mmHg. IAS/Shunts: No atrial level shunt detected by color flow Doppler. Additional Comments: Low normal LV systolic function; severe LVH (myocardium with specked appearance; consider amyloid); mild to moderate AS (mean gradient 11 mmHg and AVA 1.3 cm2); mild AI; thickened MV with mild to moderate MS (mean gradient 6 mmHg and  MVA 1.2-2 cm2; pt in atrial fibrillation at time of study); severe biatrial enlargement; mild RVE; severe RV dysfunction; severe TR with severe pulmonary hypertension. There is a large pleural effusion in the left lateral region.  LEFT VENTRICLE PLAX 2D LVIDd:         4.10 cm LVIDs:          2.70 cm LV PW:         1.60 cm LV IVS:        1.60 cm LVOT diam:     1.90 cm LV SV:         50 LV SV Index:   27 LVOT Area:     2.84 cm  LEFT ATRIUM              Index       RIGHT ATRIUM           Index LA diam:        5.10 cm  2.82 cm/m  RA Area:     39.60 cm LA Vol (A2C):   112.0 ml 61.94 ml/m RA Volume:   166.00 ml 91.80 ml/m LA Vol (A4C):   102.0 ml 56.41 ml/m LA Biplane Vol: 111.0 ml 61.39 ml/m  AORTIC VALVE AV Area (Vmax):    1.33 cm AV Area (Vmean):   1.20 cm AV Area (VTI):     1.28 cm AV Vmax:           204.00 cm/s AV Vmean:          146.500 cm/s AV VTI:            0.387 m AV Peak Grad:      16.6 mmHg AV Mean Grad:      10.0 mmHg LVOT Vmax:         95.80 cm/s LVOT Vmean:        61.800 cm/s LVOT VTI:          0.175 m LVOT/AV VTI ratio: 0.45  AORTA Ao Root diam: 3.10 cm TRICUSPID VALVE TR Peak grad:   86.1 mmHg TR Vmax:        464.00 cm/s  SHUNTS Systemic VTI:  0.18 m Systemic Diam: 1.90 cm Kirk Ruths MD Electronically signed by Kirk Ruths MD Signature Date/Time: 10/04/2019/4:14:14 PM    Final    VAS [place] LOWER EXTREMITY VENOUS (DVT)  Result Date: 10/05/2019  Lower Venous DVTStudy Indications: Covid+, Elevated D-dimer.  Risk Factors: ESRD, Liver cirrhosis. Limitations: Shadowing from arteries. Comparison Study: No prior exam. Performing Technologist: Baldwin Crown ARDMS, RVT  Examination Guidelines: A complete evaluation includes B-mode imaging, spectral Doppler, color Doppler, and power Doppler as needed of all accessible portions of each vessel. Bilateral testing is considered an integral part of a complete examination. Limited examinations for reoccurring indications may be performed as noted. The reflux portion of the exam is performed with the patient in reverse Trendelenburg.  +---------+---------------+---------+-----------+----------+--------------+ RIGHT    CompressibilityPhasicitySpontaneityPropertiesThrombus Aging  +---------+---------------+---------+-----------+----------+--------------+ CFV      Full           Yes      Yes                                 +---------+---------------+---------+-----------+----------+--------------+ SFJ      Full                                                        +---------+---------------+---------+-----------+----------+--------------+ FV Prox  Full                                                        +---------+---------------+---------+-----------+----------+--------------+ FV Mid   Full                                                        +---------+---------------+---------+-----------+----------+--------------+ FV DistalFull                                                        +---------+---------------+---------+-----------+----------+--------------+ PFV      Full                                                        +---------+---------------+---------+-----------+----------+--------------+ POP      Full           Yes      Yes                                 +---------+---------------+---------+-----------+----------+--------------+ PTV      Full                                                        +---------+---------------+---------+-----------+----------+--------------+ PERO     Full                                                        +---------+---------------+---------+-----------+----------+--------------+   +---------+---------------+---------+-----------+----------+--------------+  LEFT     CompressibilityPhasicitySpontaneityPropertiesThrombus Aging +---------+---------------+---------+-----------+----------+--------------+ CFV      Full           Yes      Yes                                 +---------+---------------+---------+-----------+----------+--------------+ SFJ      Full                                                         +---------+---------------+---------+-----------+----------+--------------+ FV Prox  Full                                                        +---------+---------------+---------+-----------+----------+--------------+ FV Mid   Full                                                        +---------+---------------+---------+-----------+----------+--------------+ FV DistalFull                                                        +---------+---------------+---------+-----------+----------+--------------+ PFV      Full                                                        +---------+---------------+---------+-----------+----------+--------------+ POP      Full           Yes      Yes                                 +---------+---------------+---------+-----------+----------+--------------+ PTV      Full                                                        +---------+---------------+---------+-----------+----------+--------------+ PERO     Full                                                        +---------+---------------+---------+-----------+----------+--------------+     Summary: BILATERAL: - No evidence of deep vein thrombosis seen in the lower extremities, bilaterally.  RIGHT: - No cystic structure found in the popliteal fossa.  LEFT: - No cystic structure found in the popliteal fossa.  *See table(s) above for measurements  and observations. Electronically signed by [name] MD on 10/05/2019 at 4:51:21 PM.    Final    IR Paracentesis  Result Date: 10/05/2019 INDICATION: Hepatitis C cirrhosis, end-stage renal disease, and recurrent ascites. Request for therapeutic and diagnostic paracentesis. EXAM: ULTRASOUND GUIDED PARACENTESIS MEDICATIONS: 1% lidocaine 15 mL COMPLICATIONS: None immediate. PROCEDURE: Informed written consent was obtained from the patient after a discussion of the risks, benefits and alternatives to treatment. A timeout was  performed prior to the initiation of the procedure. Initial ultrasound scanning demonstrates a moderate amount of ascites within the left lower abdominal quadrant. The left lower abdomen was prepped and draped in the usual sterile fashion. 1% lidocaine was used for local anesthesia. Following this, a 19 gauge, 7-cm, Yueh catheter was introduced. An ultrasound image was saved for documentation purposes. The paracentesis was performed. The catheter was removed and a dressing was applied. The patient tolerated the procedure well without immediate post procedural complication. Patient received post-procedure intravenous albumin; see nursing notes for details. FINDINGS: A total of approximately 2.8 L of clear yellow fluid was removed. Samples were sent to the laboratory as requested by the clinical team. IMPRESSION: Successful ultrasound-guided paracentesis yielding 2.8 liters of peritoneal fluid. Read by: Gareth Eagle, PA-C Electronically Signed   By: Lucrezia Europe M.D.   On: 10/05/2019 12:03      Discharge Exam: Vitals:   10/22/19 2130 10/23/19 0533  BP: 121/70 136/83  Pulse: 84 (!) 102  Resp: 18 16  Temp: 99.2 F (37.3 C) 98.2 F (36.8 C)  SpO2: 98% 92%   Vitals:   10/22/19 1709 10/22/19 2100 10/22/19 2130 10/23/19 0533  BP: (!) 148/74  121/70 136/83  Pulse: 65  84 (!) 102  Resp: 20  18 16   Temp: 97.8 F (36.6 C)  99.2 F (37.3 C) 98.2 F (36.8 C)  TempSrc: Oral  Oral Oral  SpO2: 100%  98% 92%  Weight: 60.4 kg 64.2 kg    Height:        General: Pt is alert, awake, not in acute distress Cardiovascular: RRR, S1/S2 +, no rubs, no gallops Respiratory: CTA bilaterally, no wheezing, no rhonchi Abdominal: Soft, NT, ND, bowel sounds + Extremities: no edema, no cyanosis    The results of significant diagnostics from this hospitalization (including imaging, microbiology, ancillary and laboratory) are listed below for reference.     Microbiology: Recent Results (from the past 240 hour(s))   SARS CORONAVIRUS 2 (TAT 6-24 HRS) Nasopharyngeal Nasopharyngeal Swab     Status: None   Collection Time: 10/21/19  6:14 AM   Specimen: Nasopharyngeal Swab  Result Value Ref Range Status   SARS Coronavirus 2 NEGATIVE NEGATIVE Final    Comment: (NOTE) SARS-CoV-2 target nucleic acids are NOT DETECTED. The SARS-CoV-2 RNA is generally detectable in upper and lower respiratory specimens during the acute phase of infection. Negative results do not preclude SARS-CoV-2 infection, do not rule out co-infections with other pathogens, and should not be used as the sole basis for treatment or other patient management decisions. Negative results must be combined with clinical observations, patient history, and epidemiological information. The expected result is Negative. Fact Sheet for Patients: SugarRoll.be Fact Sheet for Healthcare Providers: https://www.woods-mathews.com/ This test is not yet approved or cleared by the [place] FDA and  has been authorized for detection and/or diagnosis of SARS-CoV-2 by FDA under an Emergency Use Authorization (EUA). This EUA will remain  in effect (meaning this test can be used) for the duration of the COVID-19  declaration under Section 56 4(b)(1) of the Act, 21 U.S.C. section 360bbb-3(b)(1), unless the authorization is terminated or revoked sooner. Performed at [facility], [place] [address], [place], [place] [address]   Gram stain     Status: None   Collection Time: 10/21/19  1:00 PM   Specimen: PATH Cytology Peritoneal fluid  Result Value Ref Range Status   Specimen Description PERITONEAL FLUID  Final   Special Requests NONE  Final   Gram Stain   Final    WBC PRESENT, PREDOMINANTLY MONONUCLEAR NO ORGANISMS SEEN CYTOSPIN SMEAR Performed at [facility], [place] [address] [place], [place] [address]    Report Status 10/21/2019 FINAL  Final     Labs: BNP (last 3 results) Recent Labs     05/29/19 0900 09/30/19 0744 10/06/19 1159  BNP >4,500.0* 3,065.2* 0,932.3*   Basic Metabolic Panel: Recent Labs  Lab 10/16/19 1247 10/20/19 0743 10/21/19 0314 10/22/19 0214 10/23/19 0343  NA 137 138 138 138 137  K 4.8 4.8 4.6 5.6* 5.0  CL 94* 94* 95* 94* 97*  CO2 31 28 25 22 25   GLUCOSE 89 98 92 92 43*  BUN 20 23* 33* 43* 29*  CREATININE 4.42* 4.30* 5.30* 6.54* 4.89*  CALCIUM 9.3 9.4 9.5 9.5 9.1  MG  --  1.9 2.1  --   --    Liver Function Tests: Recent Labs  Lab 10/20/19 0743 10/21/19 0314 10/22/19 0214 10/23/19 0343  AST 29 26 38 36  ALT 18 17 18 16   ALKPHOS 164* 158* 161* 158*  BILITOT 0.9 1.1 1.0 1.3*  PROT 6.3* 6.4* 6.2* 6.0*  ALBUMIN 2.8* 2.7* 2.7* 2.5*   Recent Labs  Lab 10/20/19 0743 10/21/19 0314  LIPASE 22 19   No results for input(s): AMMONIA in the last 168 hours. CBC: Recent Labs  Lab 10/20/19 0743 10/21/19 0314 10/22/19 0214 10/23/19 0343  WBC 8.1 8.3 8.7 6.4  NEUTROABS 5.8 5.8 5.8 4.0  HGB 8.1* 8.1* 8.6* 8.1*  HCT 24.1* 22.4* 24.4* 22.3*  MCV 78.5* 82.7 75.8* 79.6*  PLT 202 198 234 195   Cardiac Enzymes: No results for input(s): CKTOTAL, CKMB, CKMBINDEX, TROPONINI in the last 168 hours. BNP: Invalid input(s): POCBNP CBG: Recent Labs  Lab 10/23/19 0539  GLUCAP 103*   D-Dimer No results for input(s): DDIMER in the last 72 hours. Hgb A1c No results for input(s): HGBA1C in the last 72 hours. Lipid Profile No results for input(s): CHOL, HDL, LDLCALC, TRIG, CHOLHDL, LDLDIRECT in the last 72 hours. Thyroid function studies No results for input(s): TSH, T4TOTAL, T3FREE, THYROIDAB in the last 72 hours.  Invalid input(s): FREET3 Anemia work up No results for input(s): VITAMINB12, FOLATE, FERRITIN, TIBC, IRON, RETICCTPCT in the last 72 hours. Urinalysis    Component Value Date/Time   COLORURINE YELLOW 07/16/2011 1619   APPEARANCEUR CLEAR 07/16/2011 1619   LABSPEC 1.018 07/16/2011 1619   PHURINE 7.5 07/16/2011 1619   GLUCOSEU  250 (A) 07/16/2011 1619   HGBUR MODERATE (A) 07/16/2011 1619   BILIRUBINUR SMALL (A) 07/16/2011 1619   KETONESUR NEGATIVE 07/16/2011 1619   PROTEINUR >300 (A) 07/16/2011 1619   UROBILINOGEN 1.0 07/16/2011 1619   NITRITE NEGATIVE 07/16/2011 1619   LEUKOCYTESUR SMALL (A) 07/16/2011 1619   Sepsis Labs Invalid input(s): PROCALCITONIN,  WBC,  LACTICIDVEN Microbiology Recent Results (from the past 240 hour(s))  SARS CORONAVIRUS 2 (TAT 6-24 HRS) Nasopharyngeal Nasopharyngeal Swab     Status: None   Collection Time: 10/21/19  6:14  AM   Specimen: Nasopharyngeal Swab  Result Value Ref Range Status   SARS Coronavirus 2 NEGATIVE NEGATIVE Final    Comment: (NOTE) SARS-CoV-2 target nucleic acids are NOT DETECTED. The SARS-CoV-2 RNA is generally detectable in upper and lower respiratory specimens during the acute phase of infection. Negative results do not preclude SARS-CoV-2 infection, do not rule out co-infections with other pathogens, and should not be used as the sole basis for treatment or other patient management decisions. Negative results must be combined with clinical observations, patient history, and epidemiological information. The expected result is Negative. Fact Sheet for Patients: SugarRoll.be Fact Sheet for Healthcare Providers: https://www.woods-mathews.com/ This test is not yet approved or cleared by the [place] FDA and  has been authorized for detection and/or diagnosis of SARS-CoV-2 by FDA under an Emergency Use Authorization (EUA). This EUA will remain  in effect (meaning this test can be used) for the duration of the COVID-19 declaration under Section 56 4(b)(1) of the Act, 21 U.S.C. section 360bbb-3(b)(1), unless the authorization is terminated or revoked sooner. Performed at [facility], [place] [address] [place], [place] [address]   Gram stain     Status: None   Collection Time: 10/21/19  1:00 PM   Specimen:  PATH Cytology Peritoneal fluid  Result Value Ref Range Status   Specimen Description PERITONEAL FLUID  Final   Special Requests NONE  Final   Gram Stain   Final    WBC PRESENT, PREDOMINANTLY MONONUCLEAR NO ORGANISMS SEEN CYTOSPIN SMEAR Performed at [facility], [place] [address], [place],  [address]    Report Status 10/21/2019 FINAL  Final     Time coordinating discharge: Over 30 minutes  SIGNED:   Darliss Cheney, MD  [facility] 10/23/2019, 11:10 AM  If 7PM-7AM, please contact night-coverage [email or web address]

## 2019-10-23 NOTE — Plan of Care (Signed)
  Problem: Clinical Measurements: Goal: Ability to maintain clinical measurements within normal limits will improve Outcome: Adequate for Discharge Goal: Diagnostic test results will improve Outcome: Adequate for Discharge   Problem: Activity: Goal: Risk for activity intolerance will decrease Outcome: Adequate for Discharge   Problem: Nutrition: Goal: Adequate nutrition will be maintained Outcome: Adequate for Discharge   Problem: Pain Managment: Goal: General experience of comfort will improve Outcome: Adequate for Discharge

## 2019-10-25 DIAGNOSIS — N186 End stage renal disease: Secondary | ICD-10-CM

## 2019-10-25 DIAGNOSIS — Z7189 Other specified counseling: Secondary | ICD-10-CM

## 2019-10-26 LAB — CULTURE, BODY FLUID W GRAM STAIN -BOTTLE: Culture: NO GROWTH

## 2019-10-27 ENCOUNTER — Emergency Department (HOSPITAL_COMMUNITY)
Admission: EM | Admit: 2019-10-27 | Discharge: 2019-10-28 | Disposition: A | Discharge status: Home / Self Care | Payer: Medicare Other | Source: Home / Self Care | Attending: Emergency Medicine | Admitting: Emergency Medicine

## 2019-10-27 ENCOUNTER — Other Ambulatory Visit: Payer: Self-pay

## 2019-10-27 ENCOUNTER — Encounter (HOSPITAL_COMMUNITY): Payer: Self-pay

## 2019-10-27 ENCOUNTER — Observation Stay (HOSPITAL_COMMUNITY)
Admission: EM | Admit: 2019-10-27 | Discharge: 2019-10-27 | Discharge status: Against Medical Advice | Payer: Medicare Other | Attending: Internal Medicine | Admitting: Internal Medicine

## 2019-10-27 DIAGNOSIS — D631 Anemia in chronic kidney disease: Secondary | ICD-10-CM | POA: Insufficient documentation

## 2019-10-27 DIAGNOSIS — I4891 Unspecified atrial fibrillation: Secondary | ICD-10-CM | POA: Diagnosis not present

## 2019-10-27 DIAGNOSIS — R1084 Generalized abdominal pain: Secondary | ICD-10-CM

## 2019-10-27 DIAGNOSIS — K219 Gastro-esophageal reflux disease without esophagitis: Secondary | ICD-10-CM | POA: Diagnosis not present

## 2019-10-27 DIAGNOSIS — F17210 Nicotine dependence, cigarettes, uncomplicated: Secondary | ICD-10-CM | POA: Insufficient documentation

## 2019-10-27 DIAGNOSIS — R748 Abnormal levels of other serum enzymes: Secondary | ICD-10-CM | POA: Diagnosis not present

## 2019-10-27 DIAGNOSIS — Z992 Dependence on renal dialysis: Secondary | ICD-10-CM | POA: Diagnosis not present

## 2019-10-27 DIAGNOSIS — N186 End stage renal disease: Secondary | ICD-10-CM | POA: Insufficient documentation

## 2019-10-27 DIAGNOSIS — Z7951 Long term (current) use of inhaled steroids: Secondary | ICD-10-CM | POA: Insufficient documentation

## 2019-10-27 DIAGNOSIS — Z20822 Contact with and (suspected) exposure to covid-19: Secondary | ICD-10-CM | POA: Diagnosis not present

## 2019-10-27 DIAGNOSIS — T391X1A Poisoning by 4-Aminophenol derivatives, accidental (unintentional), initial encounter: Secondary | ICD-10-CM | POA: Diagnosis not present

## 2019-10-27 DIAGNOSIS — I120 Hypertensive chronic kidney disease with stage 5 chronic kidney disease or end stage renal disease: Secondary | ICD-10-CM | POA: Diagnosis not present

## 2019-10-27 DIAGNOSIS — Z955 Presence of coronary angioplasty implant and graft: Secondary | ICD-10-CM | POA: Insufficient documentation

## 2019-10-27 DIAGNOSIS — Z8619 Personal history of other infectious and parasitic diseases: Secondary | ICD-10-CM | POA: Diagnosis not present

## 2019-10-27 DIAGNOSIS — K7460 Unspecified cirrhosis of liver: Secondary | ICD-10-CM | POA: Insufficient documentation

## 2019-10-27 DIAGNOSIS — I2510 Atherosclerotic heart disease of native coronary artery without angina pectoris: Secondary | ICD-10-CM | POA: Insufficient documentation

## 2019-10-27 DIAGNOSIS — R7989 Other specified abnormal findings of blood chemistry: Secondary | ICD-10-CM

## 2019-10-27 DIAGNOSIS — I252 Old myocardial infarction: Secondary | ICD-10-CM | POA: Insufficient documentation

## 2019-10-27 DIAGNOSIS — Z66 Do not resuscitate: Secondary | ICD-10-CM | POA: Insufficient documentation

## 2019-10-27 DIAGNOSIS — Z79899 Other long term (current) drug therapy: Secondary | ICD-10-CM | POA: Diagnosis not present

## 2019-10-27 DIAGNOSIS — R109 Unspecified abdominal pain: Principal | ICD-10-CM | POA: Insufficient documentation

## 2019-10-27 DIAGNOSIS — R110 Nausea: Secondary | ICD-10-CM | POA: Diagnosis not present

## 2019-10-27 DIAGNOSIS — R945 Abnormal results of liver function studies: Secondary | ICD-10-CM

## 2019-10-27 DIAGNOSIS — R9431 Abnormal electrocardiogram [ECG] [EKG]: Secondary | ICD-10-CM | POA: Insufficient documentation

## 2019-10-27 DIAGNOSIS — J449 Chronic obstructive pulmonary disease, unspecified: Secondary | ICD-10-CM | POA: Insufficient documentation

## 2019-10-27 LAB — PROTIME-INR
INR: 1.5 — ABNORMAL HIGH (ref 0.8–1.2)
Prothrombin Time: 18.2 seconds — ABNORMAL HIGH (ref 11.4–15.2)

## 2019-10-27 LAB — COMPREHENSIVE METABOLIC PANEL
ALT: 73 U/L — ABNORMAL HIGH (ref 0–44)
ALT: 94 U/L — ABNORMAL HIGH (ref 0–44)
AST: 161 U/L — ABNORMAL HIGH (ref 15–41)
AST: 203 U/L — ABNORMAL HIGH (ref 15–41)
Albumin: 2.7 g/dL — ABNORMAL LOW (ref 3.5–5.0)
Albumin: 2.7 g/dL — ABNORMAL LOW (ref 3.5–5.0)
Alkaline Phosphatase: 176 U/L — ABNORMAL HIGH (ref 38–126)
Alkaline Phosphatase: 173 U/L — ABNORMAL HIGH (ref 38–126)
Anion gap: 18 — ABNORMAL HIGH (ref 5–15)
Anion gap: 18 — ABNORMAL HIGH (ref 5–15)
BUN: 32 mg/dL — ABNORMAL HIGH (ref 6–20)
BUN: 42 mg/dL — ABNORMAL HIGH (ref 6–20)
CO2: 22 mmol/L (ref 22–32)
CO2: 25 mmol/L (ref 22–32)
Calcium: 9 mg/dL (ref 8.9–10.3)
Calcium: 9.5 mg/dL (ref 8.9–10.3)
Chloride: 95 mmol/L — ABNORMAL LOW (ref 98–111)
Chloride: 93 mmol/L — ABNORMAL LOW (ref 98–111)
Creatinine, Ser: 5.73 mg/dL — ABNORMAL HIGH (ref 0.61–1.24)
Creatinine, Ser: 6.63 mg/dL — ABNORMAL HIGH (ref 0.61–1.24)
GFR calc Af Amer: 12 mL/min — ABNORMAL LOW (ref >60)
GFR calc Af Amer: 10 mL/min — ABNORMAL LOW (ref >60)
GFR calc non Af Amer: 10 mL/min — ABNORMAL LOW (ref >60)
GFR calc non Af Amer: 8 mL/min — ABNORMAL LOW (ref >60)
Glucose, Bld: 101 mg/dL — ABNORMAL HIGH (ref 70–99)
Glucose, Bld: 100 mg/dL — ABNORMAL HIGH (ref 70–99)
Potassium: 4.4 mmol/L (ref 3.5–5.1)
Potassium: 5.1 mmol/L (ref 3.5–5.1)
Sodium: 135 mmol/L (ref 135–145)
Sodium: 136 mmol/L (ref 135–145)
Total Bilirubin: 1 mg/dL (ref 0.3–1.2)
Total Bilirubin: 1 mg/dL (ref 0.3–1.2)
Total Protein: 6.1 g/dL — ABNORMAL LOW (ref 6.5–8.1)
Total Protein: 6.1 g/dL — ABNORMAL LOW (ref 6.5–8.1)

## 2019-10-27 LAB — CBC WITH DIFFERENTIAL/PLATELET
Abs Immature Granulocytes: 0.07 10*3/uL (ref 0.00–0.07)
Basophils Absolute: 0.1 10*3/uL (ref 0.0–0.1)
Basophils Relative: 1 %
Eosinophils Absolute: 0.2 10*3/uL (ref 0.0–0.5)
Eosinophils Relative: 2 %
HCT: 24.6 % — ABNORMAL LOW (ref 39.0–52.0)
Hemoglobin: 8.7 g/dL — ABNORMAL LOW (ref 13.0–17.0)
Immature Granulocytes: 1 %
Lymphocytes Relative: 10 %
Lymphs Abs: 0.9 10*3/uL (ref 0.7–4.0)
MCH: 27.6 pg (ref 26.0–34.0)
MCHC: 35.4 g/dL (ref 30.0–36.0)
MCV: 78.1 fL — ABNORMAL LOW (ref 80.0–100.0)
Monocytes Absolute: 0.4 10*3/uL (ref 0.1–1.0)
Monocytes Relative: 5 %
Neutro Abs: 7.7 10*3/uL (ref 1.7–7.7)
Neutrophils Relative %: 81 %
Platelets: 200 10*3/uL (ref 150–400)
RBC: 3.15 MIL/uL — ABNORMAL LOW (ref 4.22–5.81)
RDW: 17.7 % — ABNORMAL HIGH (ref 11.5–15.5)
WBC: 9.4 10*3/uL (ref 4.0–10.5)
nRBC: 0.3 % — ABNORMAL HIGH (ref 0.0–0.2)

## 2019-10-27 LAB — LIPASE, BLOOD
Lipase: 22 U/L (ref 11–51)
Lipase: 20 U/L (ref 11–51)

## 2019-10-27 LAB — ETHANOL: Alcohol, Ethyl (B): <10 mg/dL (ref <10)

## 2019-10-27 LAB — CBC
HCT: 28.1 % — ABNORMAL LOW (ref 39.0–52.0)
Hemoglobin: 9.9 g/dL — ABNORMAL LOW (ref 13.0–17.0)
MCH: 27.3 pg (ref 26.0–34.0)
MCHC: 35.2 g/dL (ref 30.0–36.0)
MCV: 77.6 fL — ABNORMAL LOW (ref 80.0–100.0)
Platelets: 214 10*3/uL (ref 150–400)
RBC: 3.62 MIL/uL — ABNORMAL LOW (ref 4.22–5.81)
RDW: 18.6 % — ABNORMAL HIGH (ref 11.5–15.5)
WBC: 12.2 10*3/uL — ABNORMAL HIGH (ref 4.0–10.5)
nRBC: 0.3 % — ABNORMAL HIGH (ref 0.0–0.2)

## 2019-10-27 LAB — SARS CORONAVIRUS 2 (TAT 6-24 HRS): SARS Coronavirus 2: NEGATIVE

## 2019-10-27 LAB — ACETAMINOPHEN LEVEL
Acetaminophen (Tylenol), Serum: 52 ug/mL — ABNORMAL HIGH (ref 10–30)
Acetaminophen (Tylenol), Serum: 11 ug/mL (ref 10–30)

## 2019-10-27 LAB — APTT: aPTT: 36 seconds (ref 24–36)

## 2019-10-27 LAB — SALICYLATE LEVEL: Salicylate Lvl: <7 mg/dL — ABNORMAL LOW (ref 7.0–30.0)

## 2019-10-27 MED ORDER — SODIUM CHLORIDE 0.9% FLUSH: 3.0000 mL | Freq: Two times a day (BID) | INTRAVENOUS | Status: DC

## 2019-10-27 MED ORDER — ALBUTEROL SULFATE (2.5 MG/3ML) 0.083% IN NEBU: 2.5000 mg | INHALATION_SOLUTION | Freq: Four times a day (QID) | RESPIRATORY_TRACT | Status: DC | PRN

## 2019-10-27 MED ORDER — HYDROMORPHONE HCL 1 MG/ML IJ SOLN
1.0000 mg | Freq: Once | INTRAMUSCULAR | Status: AC
  Administered 2019-10-27: 03:00:00 1 mg via INTRAVENOUS
  Filled 2019-10-27: qty 1

## 2019-10-27 MED ORDER — SCOPOLAMINE 1 MG/3DAYS TD PT72
1.0000 | MEDICATED_PATCH | TRANSDERMAL | Status: DC | PRN
  Filled 2019-10-27 (×2): qty 1

## 2019-10-27 MED ORDER — ACETYLCYSTEINE 20 % IN SOLN
140.0000 mg/kg | Freq: Once | RESPIRATORY_TRACT | Status: AC
  Administered 2019-10-27: 8980 mg via ORAL
  Filled 2019-10-27 (×2): qty 60

## 2019-10-27 MED ORDER — ACETYLCYSTEINE 20 % IN SOLN
70.0000 mg/kg | RESPIRATORY_TRACT | Status: DC
  Filled 2019-10-27 (×2): qty 30

## 2019-10-27 MED ORDER — HEPARIN SODIUM (PORCINE) 5000 UNIT/ML IJ SOLN: 5000.0000 [IU] | Freq: Three times a day (TID) | INTRAMUSCULAR | Status: DC

## 2019-10-27 MED ORDER — LORAZEPAM 2 MG/ML IJ SOLN
0.5000 mg | Freq: Once | INTRAMUSCULAR | Status: AC
  Administered 2019-10-27: 0.5 mg via INTRAVENOUS
  Filled 2019-10-27: qty 1

## 2019-10-27 MED ORDER — SODIUM CHLORIDE 0.9% FLUSH: 3.0000 mL | Freq: Once | INTRAVENOUS | Status: DC

## 2019-10-27 NOTE — ED Notes (Signed)
ADM . MD. Contacted and is coming to talk to pt. Pt refuses to move to another room to wait.

## 2019-10-27 NOTE — ED Triage Notes (Signed)
BIB EMS from home reporting N/V. Just seen this AM for same and left AMA stating he needed to go to a wedding. Pt was seen for N/V and abd pain and was being admitted for accidental acetaminophen overdose.

## 2019-10-27 NOTE — ED Provider Notes (Cosign Needed)
[facility] EMERGENCY DEPARTMENT Provider Note   CSN: [id number] Arrival date & time: 10/27/19  0109     History Chief Complaint  Patient presents with  . Abdominal Pain    [name] is a 59 y.o. male with history of cardiomegaly, COPD, ESRD on dialysis Monday Wednesday Friday, hepatitis B, hepatitis C, liver cirrhosis presents for evaluation of ongoing but worsening abdominal pain.  He reports that this pain has been ongoing for several months for which he is seen by specialist at [facility].  He frequents our ED often for complaint of abdominal pain, has had 35 visits in the last 6 months for similar complaints.  He notes nausea but no vomiting.  He denies chest pain, shortness of breath, fevers.  He does not produce urine.  Denies diarrhea or constipation.  He reports the pain is sharp, constant, worse around the periumbilical region.  He denies abdominal distention.  He had a recent admission to the hospital on 10/21/2019 for paracentesis.  This was not concerning for SBP at the time.  He tells me that he has not tried anything for his pain because he is out of his narcotic pain medicines prescribed to him by his pain management physician.  He tells me he last received dialysis on Friday but was only able to tolerate 2 hours of the treatment due to his abdominal pain.   The history is provided by the patient.       Past Medical History:  Diagnosis Date  . Anemia   . Anxiety   . Arthritis    left shoulder  . Atherosclerosis of aorta ([place])   . Cardiomegaly   . Chest pain    DATE UNKNOWN, C/O PERIODICALLY  . Cocaine abuse ([place])   . COPD exacerbation ([place]) 08/17/2016  . Coronary artery disease    stent 02/22/17  . ESRD (end stage renal disease) on dialysis ([place])    "[name]; MWF" (07/04/2017)  . GERD (gastroesophageal reflux disease)    DATE UNKNOWN  . Hemorrhoids   . Hepatitis B, chronic ([place])   . Hepatitis C   . History of kidney stones   .  Hyperkalemia   . Hypertension   . Kidney failure   . Metabolic bone disease    Patient denies  . Mitral stenosis   . Myocardial infarction ([place])   . Pneumonia   . Pulmonary edema   . Solitary rectal ulcer syndrome 07/2017   at flex sig for rectal bleeding  . Tubular adenoma of colon     Patient Active Problem List   Diagnosis Date Noted  . Acetaminophen overdose 10/27/2019  . ESRD (end stage renal disease) ([place])   . Goals of care, counseling/discussion   . Hypoxia 10/04/2019  . Advanced care planning/counseling discussion   . Renal failure 09/26/2019  . Dyspnea 06/09/2019  . Acquired thrombophilia ([place]) 06/05/2019  . A-fib ([place]) 05/30/2019  . Atrial fibrillation with RVR ([place]) 05/29/2019  . Melena   . Pressure injury of skin 03/09/2019  . Abdominal distention   . Volume overload 12/28/2018  . Sepsis ([place]) 09/12/2018  . Atherosclerosis of native coronary artery of native heart without angina pectoris 03/11/2018  . Benign neoplasm of cecum   . Benign neoplasm of ascending colon   . Benign neoplasm of descending colon   . Benign neoplasm of rectum   . Paroxysmal atrial fibrillation ([place]) 01/23/2018  . Hx of colonic polyps 01/20/2018  . End-stage renal disease on hemodialysis (  [place]) 11/21/2017  . GERD (gastroesophageal reflux disease) 11/16/2017  . Decompensated hepatic cirrhosis ([place]) 11/15/2017  . DNR (do not resuscitate)   . Palliative care by specialist   . Hyponatremia 11/04/2017  . SBP (spontaneous bacterial peritonitis) ([place]) 10/30/2017  . Liver disease, chronic 10/30/2017  . SOB (shortness of breath)   . Abdominal pain 10/28/2017  . Upper airway cough syndrome with flattening on f/v loop 10/13/17 c/w vcd 10/17/2017  . Elevated diaphragm 10/13/2017  . Ileus ([place]) 09/29/2017  . QT prolongation 09/29/2017  . Malnutrition of moderate degree 09/29/2017  . Sinus congestion 09/03/2017  . Symptomatic anemia 09/02/2017  . Other cirrhosis of liver ([place]) 09/02/2017  .  Left bundle branch block 09/02/2017  . Mitral stenosis 09/02/2017  . Hematochezia 07/15/2017  . Wide-complex tachycardia ([place])   . Endotracheally intubated   . ESRD on dialysis ([place]) 07/04/2017  . Acute respiratory failure with hypoxia ([place]) 06/18/2017  . CKD (chronic kidney disease) stage V requiring chronic dialysis ([place]) 06/18/2017  . History of Cocaine abuse ([place]) 06/18/2017  . Hypertension 06/18/2017  . Infection of AV graft for dialysis ([place]) 06/18/2017  . Anxiety 06/18/2017  . Anemia due to chronic kidney disease 06/18/2017  . Atypical atrial flutter ([place]) 06/18/2017  . Personality disorder ([place]) 06/13/2017  . Cellulitis 06/12/2017  . Adjustment disorder with mixed anxiety and depressed mood 06/10/2017  . Suicidal ideation 06/10/2017  . Arm wound, left, sequela 06/10/2017  . Dyspnea on exertion 05/29/2017  . Tachycardia 05/29/2017  . Hyperkalemia 05/22/2017  . Acute metabolic encephalopathy   . Anemia 04/23/2017  . Ascites 04/23/2017  . COPD (chronic obstructive pulmonary disease) ([place]) 04/23/2017  . Acute on chronic respiratory failure with hypoxia ([place]) 03/25/2017  . Arrhythmia 03/25/2017  . COPD GOLD 0 with flattening on inps f/v  09/27/2016  . Essential hypertension 09/27/2016  . Fluid overload 08/30/2016  . COPD exacerbation ([place]) 08/17/2016  . Hypertensive urgency 08/17/2016  . Respiratory failure ([place]) 08/17/2016  . Problem with dialysis access ([place]) 07/23/2016  . Chronic hepatitis B ([place]) 03/05/2014  . Chronic hepatitis C without hepatic coma ([place]) 03/05/2014  . Internal hemorrhoids with bleeding, swelling and itching 03/05/2014  . Thrombocytopenia ([place]) 03/05/2014  . Chest pain 02/27/2014  . Alcohol abuse 04/14/2009  . Cigarette smoker 04/14/2009  . GANGLION CYST 04/14/2009    Past Surgical History:  Procedure Laterality Date  . A/V FISTULAGRAM Left 05/26/2017   Procedure: A/V FISTULAGRAM;  Surgeon: Conrad Park Crest, MD;  Location: [facility] CV LAB;   Service: Cardiovascular;  Laterality: Left;  . A/V FISTULAGRAM Right 11/18/2017   Procedure: A/V FISTULAGRAM - Right Arm;  Surgeon: Elam Dutch, MD;  Location: [facility];  Service: Cardiovascular;  Laterality: Right;  . APPLICATION OF WOUND VAC Left 06/14/2017   Procedure: APPLICATION OF WOUND VAC;  Surgeon: Katha Cabal, MD;  Location: [facility] ORS;  Service: Vascular;  Laterality: Left;  . AV FISTULA PLACEMENT  2012   BELIEVED WAS PLACED IN JUNE  . AV FISTULA PLACEMENT Right 08/09/2017   Procedure: Creation Right arm ARTERIOVENOUS BRACHIOCEPOHALIC FISTULA;  Surgeon: Elam Dutch, MD;  Location: [facility] OR;  Service: Vascular;  Laterality: Right;  . AV FISTULA PLACEMENT Right 11/22/2017   Procedure: INSERTION OF ARTERIOVENOUS (AV) GORE-TEX GRAFT RIGHT UPPER ARM;  Surgeon: Elam Dutch, MD;  Location: [facility];  Service: Vascular;  Laterality: Right;  . BIOPSY  01/25/2018   Procedure: BIOPSY;  Surgeon: Jerene Bears, MD;  Location: [facility]  ENDOSCOPY;  Service: Gastroenterology;;  . BIOPSY  04/10/2019   Procedure: BIOPSY;  Surgeon: Jerene Bears, MD;  Location: [facility] ENDOSCOPY;  Service: Gastroenterology;;  . COLONOSCOPY    . COLONOSCOPY WITH PROPOFOL N/A 01/25/2018   Procedure: COLONOSCOPY WITH PROPOFOL;  Surgeon: Jerene Bears, MD;  Location: [facility];  Service: Gastroenterology;  Laterality: N/A;  . CORONARY STENT INTERVENTION N/A 02/22/2017   Procedure: CORONARY STENT INTERVENTION;  Surgeon: Nigel Mormon, MD;  Location: [facility];  Service: Cardiovascular;  Laterality: N/A;  . ESOPHAGOGASTRODUODENOSCOPY (EGD) WITH PROPOFOL N/A 01/25/2018   Procedure: ESOPHAGOGASTRODUODENOSCOPY (EGD) WITH PROPOFOL;  Surgeon: Jerene Bears, MD;  Location: [facility];  Service: Gastroenterology;  Laterality: N/A;  . ESOPHAGOGASTRODUODENOSCOPY (EGD) WITH PROPOFOL N/A 04/10/2019   Procedure: ESOPHAGOGASTRODUODENOSCOPY (EGD) WITH PROPOFOL;  Surgeon: Jerene Bears, MD;  Location: [facility]  ENDOSCOPY;  Service: Gastroenterology;  Laterality: N/A;  . FLEXIBLE SIGMOIDOSCOPY N/A 07/15/2017   Procedure: FLEXIBLE SIGMOIDOSCOPY;  Surgeon: Carol Ada, MD;  Location: [facility];  Service: Endoscopy;  Laterality: N/A;  . HEMORRHOID BANDING    . I & D EXTREMITY Left 06/01/2017   Procedure: IRRIGATION AND DEBRIDEMENT LEFT ARM HEMATOMA WITH LIGATION OF LEFT ARM AV FISTULA;  Surgeon: Elam Dutch, MD;  Location: [facility];  Service: Vascular;  Laterality: Left;  . I & D EXTREMITY Left 06/14/2017   Procedure: IRRIGATION AND DEBRIDEMENT EXTREMITY;  Surgeon: Katha Cabal, MD;  Location: [facility] ORS;  Service: Vascular;  Laterality: Left;  . INSERTION OF DIALYSIS CATHETER  05/30/2017  . INSERTION OF DIALYSIS CATHETER N/A 05/30/2017   Procedure: INSERTION OF DIALYSIS CATHETER;  Surgeon: Elam Dutch, MD;  Location: [facility];  Service: Vascular;  Laterality: N/A;  . IR PARACENTESIS  08/30/2017  . IR PARACENTESIS  09/29/2017  . IR PARACENTESIS  10/28/2017  . IR PARACENTESIS  11/09/2017  . IR PARACENTESIS  11/16/2017  . IR PARACENTESIS  11/28/2017  . IR PARACENTESIS  12/01/2017  . IR PARACENTESIS  12/06/2017  . IR PARACENTESIS  01/03/2018  . IR PARACENTESIS  01/23/2018  . IR PARACENTESIS  02/07/2018  . IR PARACENTESIS  02/21/2018  . IR PARACENTESIS  03/06/2018  . IR PARACENTESIS  03/17/2018  . IR PARACENTESIS  04/04/2018  . IR PARACENTESIS  12/28/2018  . IR PARACENTESIS  01/08/2019  . IR PARACENTESIS  01/23/2019  . IR PARACENTESIS  02/01/2019  . IR PARACENTESIS  02/19/2019  . IR PARACENTESIS  03/01/2019  . IR PARACENTESIS  03/15/2019  . IR PARACENTESIS  04/03/2019  . IR PARACENTESIS  04/12/2019  . IR PARACENTESIS  05/01/2019  . IR PARACENTESIS  05/08/2019  . IR PARACENTESIS  05/24/2019  . IR PARACENTESIS  06/12/2019  . IR PARACENTESIS  07/09/2019  . IR PARACENTESIS  07/27/2019  . IR PARACENTESIS  08/09/2019  . IR PARACENTESIS  08/21/2019  . IR PARACENTESIS  09/17/2019  . IR PARACENTESIS  10/05/2019  . IR  RADIOLOGIST EVAL & MGMT  02/14/2018  . IR RADIOLOGIST EVAL & MGMT  02/22/2019  . LEFT HEART CATH AND CORONARY ANGIOGRAPHY N/A 02/22/2017   Procedure: LEFT HEART CATH AND CORONARY ANGIOGRAPHY;  Surgeon: Nigel Mormon, MD;  Location: [facility];  Service: Cardiovascular;  Laterality: N/A;  . LIGATION OF ARTERIOVENOUS  FISTULA Left 10/10/6604   Procedure: Plication of Left Arm Arteriovenous Fistula;  Surgeon: Elam Dutch, MD;  Location: [facility];  Service: Vascular;  Laterality: Left;  . POLYPECTOMY    . POLYPECTOMY  01/25/2018  Procedure: POLYPECTOMY;  Surgeon: Jerene Bears, MD;  Location: [facility];  Service: Gastroenterology;;  . REVISON OF ARTERIOVENOUS FISTULA Left 12/11/930   Procedure: PLICATION OF DISTAL ANEURYSMAL SEGEMENT OF LEFT UPPER ARM ARTERIOVENOUS FISTULA;  Surgeon: Elam Dutch, MD;  Location: [facility];  Service: Vascular;  Laterality: Left;  . REVISON OF ARTERIOVENOUS FISTULA Left 3/55/7322   Procedure: Plication of Left Upper Arm Fistula ;  Surgeon: Waynetta Sandy, MD;  Location: [facility];  Service: Vascular;  Laterality: Left;  . SKIN GRAFT SPLIT THICKNESS LEG / FOOT Left    SKIN GRAFT SPLIT THICKNESS LEFT ARM DONOR SITE: LEFT ANTERIOR THIGH  . SKIN SPLIT GRAFT Left 07/04/2017   Procedure: SKIN GRAFT SPLIT THICKNESS LEFT ARM DONOR SITE: LEFT ANTERIOR THIGH;  Surgeon: Elam Dutch, MD;  Location: [facility];  Service: Vascular;  Laterality: Left;  . THROMBECTOMY W/ EMBOLECTOMY Left 06/05/2017   Procedure: EXPLORATION OF LEFT ARM FOR BLEEDING; OVERSEWED PROXIMAL FISTULA;  Surgeon: Angelia Mould, MD;  Location: [place] [facility];  Service: Vascular;  Laterality: Left;  . WOUND EXPLORATION Left 06/03/2017   Procedure: WOUND EXPLORATION WITH WOUND VAC APPLICATION TO LEFT ARM;  Surgeon: Angelia Mould, MD;  Location[facility] OR;  Service: Vascular;  Laterality: Left;       Family History  Problem Relation Age of Onset  . Heart disease Mother   . Lung  cancer Mother   . Heart disease Father   . Malignant hyperthermia Father   . COPD Father   . Throat cancer Sister   . Esophageal cancer Sister   . Hypertension Other   . COPD Other   . Colon cancer Neg Hx   . Colon polyps Neg Hx   . Rectal cancer Neg Hx   . Stomach cancer Neg Hx     Social History   Tobacco Use  . Smoking status: Current Every Day Smoker    Packs/day: 0.50    Years: 43.00    Pack years: 21.50    Types: Cigarettes    Start date: 08/13/1973  . Smokeless tobacco: Never Used  . Tobacco comment: i dont know i just make   Substance Use Topics  . Alcohol use: Not Currently    Comment: quit drinking in 2017  . Drug use: Not Currently    Types: Marijuana, Cocaine    Comment: reports using once every 3 months, 04-06-2019 was this     Home Medications Prior to Admission medications   Medication Sig Start Date End Date Taking? Authorizing Provider  albuterol (VENTOLIN HFA) 108 (90 Base) MCG/ACT inhaler Inhale 2 puffs into the lungs every 6 (six) hours as needed for wheezing or shortness of breath.   Yes [provider]  budesonide-formoterol (SYMBICORT) 160-4.5 MCG/ACT inhaler Inhale 2 puffs into the lungs 2 (two) times daily.   Yes [provider]  diltiazem (TIAZAC) 240 MG 24 hr capsule Take 240 mg by mouth daily. 08/16/19   [provider]  hydrALAZINE (APRESOLINE) 100 MG tablet Take 100 mg by mouth 2 (two) times daily.    [provider]  hydrOXYzine (ATARAX/VISTARIL) 25 MG tablet Take 25 mg by mouth 2 (two) times daily as needed for itching.  08/21/19   [provider]  lactulose (CHRONULAC) 10 GM/15ML solution Take 45 mLs (30 g total) by mouth daily as needed for mild constipation. 10/01/19   [name], MD  metoprolol tartrate (LOPRESSOR) 100 MG tablet Take 1 tablet (100 mg total) by mouth  2 (two) times daily. 06/04/19   Al Decant, MD  montelukast (SINGULAIR) 10 MG tablet Take 10 mg by mouth every evening.      [provider]  ondansetron (ZOFRAN) 4 MG tablet Take 4 mg by mouth every 4 (four) hours as needed for nausea or vomiting. 09/18/19   [provider]  Oxycodone HCl 10 MG TABS Take 10 mg by mouth 3 (three) times daily as needed (pain).  08/25/19   [provider]  pantoprazole (PROTONIX) 40 MG tablet Take 40 mg by mouth daily.    [provider]  sevelamer carbonate (RENVELA) 800 MG tablet Take 1,600-3,200 mg by mouth See admin instructions. Take 4 tablets (3200 mg) by mouth 3 times daily with meals and 2 tablets (1600 mg) with snacks    [provider]  zolpidem (AMBIEN) 10 MG tablet Take 10 mg by mouth at bedtime as needed for sleep.    [provider]    Allergies    Tramadol, Morphine and related, Pollen extract, Acetaminophen, Aspirin, and Clonidine derivatives  Review of Systems   Review of Systems  Constitutional: Negative for chills and fever.  Respiratory: Negative for shortness of breath.   Cardiovascular: Negative for chest pain.  Gastrointestinal: Positive for abdominal pain and nausea. Negative for blood in stool, constipation, diarrhea and vomiting.  All other systems reviewed and are negative.   Physical Exam Updated Vital Signs BP (!) 148/65   Pulse 75   Temp 98.7 F (37.1 C) (Oral)   Resp 12   SpO2 97%   Physical Exam Vitals and nursing note reviewed.  Constitutional:      General: He is not in acute distress.    Comments: Chronically ill in appearance  HENT:     Head: Normocephalic and atraumatic.  Eyes:     General:        Right eye: No discharge.        Left eye: No discharge.     Conjunctiva/sclera: Conjunctivae normal.  Neck:     Vascular: No JVD.     Trachea: No tracheal deviation.  Cardiovascular:     Rate and Rhythm: Normal rate and regular rhythm.     Comments: AV fistula to the left upper extremity with palpable thrill Pulmonary:     Effort: Pulmonary effort is normal.     Breath sounds:  Normal breath sounds.  Abdominal:     General: Abdomen is protuberant. Bowel sounds are normal. There is no distension.     Palpations: Abdomen is soft.     Tenderness: There is generalized abdominal tenderness. There is no guarding or rebound.  Skin:    General: Skin is warm and dry.     Findings: No erythema.  Neurological:     Mental Status: He is alert.  Psychiatric:        Behavior: Behavior normal.     ED Results / Procedures / Treatments   Labs (all labs ordered are listed, but only abnormal results are displayed) Labs Reviewed  CBC WITH DIFFERENTIAL/PLATELET - Abnormal; Notable for the following components:      Result Value   RBC 3.15 (*)    Hemoglobin 8.7 (*)    HCT 24.6 (*)    MCV 78.1 (*)    RDW 17.7 (*)    nRBC 0.3 (*)    All other components within normal limits  COMPREHENSIVE METABOLIC PANEL - Abnormal; Notable for the following components:   Chloride 95 (*)  Glucose, Bld 101 (*)    BUN 32 (*)    Creatinine, Ser 5.73 (*)    Total Protein 6.1 (*)    Albumin 2.7 (*)    AST 161 (*)    ALT 73 (*)    Alkaline Phosphatase 176 (*)    GFR calc non Af Amer 10 (*)    GFR calc Af Amer 12 (*)    Anion gap 18 (*)    All other components within normal limits  PROTIME-INR - Abnormal; Notable for the following components:   Prothrombin Time 18.2 (*)    INR 1.5 (*)    All other components within normal limits  ACETAMINOPHEN LEVEL - Abnormal; Notable for the following components:   Acetaminophen (Tylenol), Serum 52 (*)    All other components within normal limits  SALICYLATE LEVEL - Abnormal; Notable for the following components:   Salicylate Lvl <8.1 (*)    All other components within normal limits  SARS CORONAVIRUS 2 (TAT 6-24 HRS)  LIPASE, BLOOD  APTT  ETHANOL  URINALYSIS, ROUTINE W REFLEX MICROSCOPIC    EKG EKG Interpretation  Date/Time:  Saturday October 27 2019 02:49:14 EDT Ventricular Rate:  77 PR Interval:    QRS Duration: 190 QT  Interval:  519 QTC Calculation: 588 R Axis:   -72 Text Interpretation: Sinus rhythm Prolonged PR interval Left bundle branch block No significant change since last tracing Confirmed by Addison Lank [phone]) on 10/27/2019 3:25:14 AM   Radiology No results found.  Procedures Procedures (including critical care time)  Medications Ordered in ED Medications  acetylcysteine (MUCOMYST) 20 % nebulizer / oral solution 8,980 mg (has no administration in time range)    Followed by  acetylcysteine (MUCOMYST) 20 % nebulizer / oral solution 4,500 mg (has no administration in time range)  HYDROmorphone (DILAUDID) injection 1 mg (1 mg Intravenous Given 10/27/19 0317)  LORazepam (ATIVAN) injection 0.5 mg (0.5 mg Intravenous Given 10/27/19 0440)    ED Course  I have reviewed the triage vital signs and the nursing notes.  Pertinent labs & imaging results that were available during my care of the patient were reviewed by me and considered in my medical decision making (see chart for details).    MDM Rules/Calculators/A&P                      Patient presenting for evaluation of ongoing and worsening abdominal pain.  Reports has been present for the last several months, worse in the periumbilical region.  No rebound or guarding noted on examination of the abdomen but his abdomen is protuberant.  He is afebrile, vital signs are stable.  He is chronically ill but nontoxic in appearance.  He is generally noncompliant with dialysis, had partial treatment on Friday.  He was given IV Dilaudid for pain.  He requested something for nausea, EKG shows prolonged QT so he was given Ativan to avoid any QT prolonging medications.  He has no active vomiting.  Lab work reviewed and interpreted by myself shows no leukocytosis, stable anemia with hemoglobin of 8.7.  BUN and creatinine elevated consistent with history of ESRD on dialysis.  CMP shows worsening LFTs compared to baseline.  PT/INR is also mildly elevated  compared to most recent lab work.  AST to ALT ratio concerning for alcohol abuse however he tells me he "cannot remember the last time I drink any alcohol".  His ethanol level today is negative.  He does tell me that he takes extra  strength Tylenol frequently for his pain.  He tells me that he had 4 or 5 tablets of extra strength Tylenol on Friday, thinks that his last dose was around 4 or 5 PM but he is unsure.  He is generally a poor historian.  His acetaminophen level today is elevated at 52.  Given history of cirrhosis, will give Mucomyst.  6:25 AM CONSULT: Spoke with Dr. Benson Norway with gastroenterology who is covering for [place].  He will see the patient in consultation.  7:25AM Spoke with Dr. Tamala Julian with [facility] hospitalist service who agrees to assume care of patient and bring him to the hospital for further evaluation and management.  Spoke with Steffanie Dunn at [employer].  Our pharmacist Remigio Eisenmenger has already spoken with her.  Poison control recommends giving mucomyst and will reassess. He will still be able to receive dialysis.   Final Clinical Impression(s) / ED Diagnoses Final diagnoses:  Accidental acetaminophen overdose, initial encounter  Abnormal LFTs  Generalized abdominal pain    Rx / DC Orders ED Discharge Orders    None       Renita Papa, PA-C 10/27/19 8502

## 2019-10-27 NOTE — ED Notes (Signed)
Pt stated he could not breath. Pt stated, "I just need 5 min of O2". This tech checked O2, pt's sat 98% RA.

## 2019-10-27 NOTE — ED Provider Notes (Signed)
Attestation: Medical screening examination/treatment/procedure(s) were conducted as a shared visit with non-physician practitioner(s) and myself.  I personally evaluated the patient during the encounter.   Briefly, the patient is a 59 y.o. male with h/o cardiomegaly, COPD, ESRD on dialysis Monday Wednesday Friday, hepatitis B, hepatitis C, liver cirrhosis presents for evaluation of ongoing but worsening abdominal pain.  Vitals:   10/27/19 0245 10/27/19 0500  BP: 124/61 (!) 148/65  Pulse:  75  Resp: 19 12  Temp:    SpO2:  97%    CONSTITUTIONAL:  Chronically ill-appearing, NAD NEURO:  Alert and oriented x 3, no focal deficits EYES:  pupils equal and reactive ENT/NECK:  trachea midline, no JVD CARDIO:  reg rate, reg rhythm, well-perfused PULM:  None labored breathing GI/GU:  Abdomin non-distended MSK/SPINE:  No gross deformities, no edema SKIN:  no rash, atraumatic PSYCH:  Appropriate speech and behavior   EKG Interpretation  Date/Time:  Saturday October 27 2019 02:49:14 EDT Ventricular Rate:  77 PR Interval:    QRS Duration: 190 QT Interval:  519 QTC Calculation: 588 R Axis:   -72 Text Interpretation: Sinus rhythm Prolonged PR interval Left bundle branch block No significant change since last tracing Confirmed by Addison Lank [phone]) on 10/27/2019 3:25:14 AM       Work up noted to have elevated LFTs. On questioning, patient took several Tylenols. level elevated. Mucomyst started given liver injury. Admitted.  CRITICAL CARE Performed by: Grayce Sessions Callie Bunyard Total critical care time: 10 minutes Critical care time was exclusive of separately billable procedures and treating other patients. Critical care was necessary to treat or prevent imminent or life-threatening deterioration. Critical care was time spent personally by me on the following activities: development of treatment plan with patient and/or surrogate as well as nursing, discussions with consultants, evaluation of  patient's response to treatment, examination of patient, obtaining history from patient or surrogate, ordering and performing treatments and interventions, ordering and review of laboratory studies, ordering and review of radiographic studies, pulse oximetry and re-evaluation of patient's condition.        Fatima Blank, MD 10/27/19 [phone]

## 2019-10-27 NOTE — ED Notes (Signed)
DR Tamala Julian at bed side . Pt will leave AMA.

## 2019-10-27 NOTE — ED Notes (Signed)
PT continues to remove pulse -OX sensor. Pt understands the need for sensor.

## 2019-10-27 NOTE — ED Notes (Signed)
PT refuses admission.PT reports he has a Wedding to go to.

## 2019-10-27 NOTE — H&P (Addendum)
History and Physical    [name] RKY:[id number] DOB: Apr 22, 1961 DOA: 10/27/2019  Referring MD/NP/PA: Rodell Perna, PA-C PCP: [name], FNP  Patient coming from: Home via EMS  Chief Complaint: Abdominal pain  I have personally briefly reviewed patient's old medical records in [facility]  **Patient left AGAINST MEDICAL ADVICE**  HPI: [name] is a 59 y.o. male with medical history significant of ESRD on HD(M/W/F), HTN, HLD, CAD, COPD, A. fib, anemia of chronic disease, cardiomegaly, hepatitis B & C, compensated liver cirrhosis, tobacco abuse, and remote cocaine/alcohol abuse.  Patient presented with complaints of worsening abdominal pain over the last several months.  Pain is reportedly sharp and feels it across his abdomen.  Notes that he has been taking 2 tablets extra strength Tylenol anywhere from 3-4 times a day after running out of his narcotic pain medicine to symptoms.  He reports that he previously had treatment for his hepatitis C, but is unsure if he had treatment for his hepatitis B.  Denies having any significant fever, chest pain, shortness of breath, fevers, vomiting, diarrhea, or significant leg swelling.  Associated symptoms include nausea.  His last hemodialysis session was on Friday, but he was not able to complete the full session.  He makes note that he has a wedding that he must attend to today and request to leave AMA.  ED Course: Upon admission into the emergency department patient was seen to be afebrile, mildly tachypneic, and all other vital signs relatively within normal limits.  Labs significant for hemoglobin 8.7(near baseline), potassium 4.4, BUN 32, creatinine 5.73, ALP 176, AST 161, ALT 73, lipase 22, total bilirubin 1, INR 1.5, ethanol level undetectable, and acetaminophen level 52.  Dr. Benson Norway of GI was formally consulted.  Patient was started on oral acetylcysteine.  Review of Systems  Constitutional: Negative for fever.    Eyes: Negative for photophobia and pain.  Gastrointestinal: Positive for abdominal pain and nausea. Negative for diarrhea and vomiting.    Past Medical History:  Diagnosis Date  . Anemia   . Anxiety   . Arthritis    left shoulder  . Atherosclerosis of aorta ([place])   . Cardiomegaly   . Chest pain    DATE UNKNOWN, C/O PERIODICALLY  . Cocaine abuse ([place])   . COPD exacerbation ([place]) 08/17/2016  . Coronary artery disease    stent 02/22/17  . ESRD (end stage renal disease) on dialysis ([place])    "[name]; MWF" (07/04/2017)  . GERD (gastroesophageal reflux disease)    DATE UNKNOWN  . Hemorrhoids   . Hepatitis B, chronic ([employer])   . Hepatitis C   . History of kidney stones   . Hyperkalemia   . Hypertension   . Kidney failure   . Metabolic bone disease    Patient denies  . Mitral stenosis   . Myocardial infarction ([place])   . Pneumonia   . Pulmonary edema   . Solitary rectal ulcer syndrome 07/2017   at flex sig for rectal bleeding  . Tubular adenoma of colon     Past Surgical History:  Procedure Laterality Date  . A/V FISTULAGRAM Left 05/26/2017   Procedure: A/V FISTULAGRAM;  Surgeon: Conrad [place]ann [place], MD;  Location: [place] [facility];  Service: Cardiovascular;  Laterality: Left;  . A/V FISTULAGRAM Right 11/18/2017   Procedure: A/V FISTULAGRAM - Right Arm;  Surgeon: Elam Dutch, MD;  Location: [place] [facility];  Service: Cardiovascular;  Laterality: Right;  .  APPLICATION OF WOUND VAC Left 06/14/2017   Procedure: APPLICATION OF WOUND VAC;  Surgeon: Katha Cabal, MD;  Location: [facility];  Service: Vascular;  Laterality: Left;  . AV FISTULA PLACEMENT  2012   BELIEVED WAS PLACED IN JUNE  . AV FISTULA PLACEMENT Right 08/09/2017   Procedure: Creation Right arm ARTERIOVENOUS BRACHIOCEPOHALIC FISTULA;  Surgeon: Elam Dutch, MD;  Location: [facility];  Service: Vascular;  Laterality: Right;  . AV FISTULA PLACEMENT Right 11/22/2017   Procedure: INSERTION OF ARTERIOVENOUS (AV)  GORE-TEX GRAFT RIGHT UPPER ARM;  Surgeon: Elam Dutch, MD;  Location: [place];  Service: Vascular;  Laterality: Right;  . BIOPSY  01/25/2018   Procedure: BIOPSY;  Surgeon: Jerene Bears, MD;  Location: [place];  Service: Gastroenterology;;  . BIOPSY  04/10/2019   Procedure: BIOPSY;  Surgeon: Jerene Bears, MD;  Location: [facility];  Service: Gastroenterology;;  . COLONOSCOPY    . COLONOSCOPY WITH PROPOFOL N/A 01/25/2018   Procedure: COLONOSCOPY WITH PROPOFOL;  Surgeon: Jerene Bears, MD;  Location: [place];  Service: Gastroenterology;  Laterality: N/A;  . CORONARY STENT INTERVENTION N/A 02/22/2017   Procedure: CORONARY STENT INTERVENTION;  Surgeon: Nigel Mormon, MD;  Location: [place] [facility];  Service: Cardiovascular;  Laterality: N/A;  . ESOPHAGOGASTRODUODENOSCOPY (EGD) WITH PROPOFOL N/A 01/25/2018   Procedure: ESOPHAGOGASTRODUODENOSCOPY (EGD) WITH PROPOFOL;  Surgeon: Jerene Bears, MD;  Location: [place];  Service: Gastroenterology;  Laterality: N/A;  . ESOPHAGOGASTRODUODENOSCOPY (EGD) WITH PROPOFOL N/A 04/10/2019   Procedure: ESOPHAGOGASTRODUODENOSCOPY (EGD) WITH PROPOFOL;  Surgeon: Jerene Bears, MD;  Location: [facility];  Service: Gastroenterology;  Laterality: N/A;  . FLEXIBLE SIGMOIDOSCOPY N/A 07/15/2017   Procedure: FLEXIBLE SIGMOIDOSCOPY;  Surgeon: Carol Ada, MD;  Location: [place];  Service: Endoscopy;  Laterality: N/A;  . HEMORRHOID BANDING    . I & D EXTREMITY Left 06/01/2017   Procedure: IRRIGATION AND DEBRIDEMENT LEFT ARM HEMATOMA WITH LIGATION OF LEFT ARM AV FISTULA;  Surgeon: Elam Dutch, MD;  Location: [place];  Service: Vascular;  Laterality: Left;  . I & D EXTREMITY Left 06/14/2017   Procedure: IRRIGATION AND DEBRIDEMENT EXTREMITY;  Surgeon: Katha Cabal, MD;  Location: [facility];  Service: Vascular;  Laterality: Left;  . INSERTION OF DIALYSIS CATHETER  05/30/2017  . INSERTION OF DIALYSIS CATHETER N/A 05/30/2017   Procedure:  INSERTION OF DIALYSIS CATHETER;  Surgeon: Elam Dutch, MD;  Location: [place];  Service: Vascular;  Laterality: N/A;  . IR PARACENTESIS  08/30/2017  . IR PARACENTESIS  09/29/2017  . IR PARACENTESIS  10/28/2017  . IR PARACENTESIS  11/09/2017  . IR PARACENTESIS  11/16/2017  . IR PARACENTESIS  11/28/2017  . IR PARACENTESIS  12/01/2017  . IR PARACENTESIS  12/06/2017  . IR PARACENTESIS  01/03/2018  . IR PARACENTESIS  01/23/2018  . IR PARACENTESIS  02/07/2018  . IR PARACENTESIS  02/21/2018  . IR PARACENTESIS  03/06/2018  . IR PARACENTESIS  03/17/2018  . IR PARACENTESIS  04/04/2018  . IR PARACENTESIS  12/28/2018  . IR PARACENTESIS  01/08/2019  . IR PARACENTESIS  01/23/2019  . IR PARACENTESIS  02/01/2019  . IR PARACENTESIS  02/19/2019  . IR PARACENTESIS  03/01/2019  . IR PARACENTESIS  03/15/2019  . IR PARACENTESIS  04/03/2019  . IR PARACENTESIS  04/12/2019  . IR PARACENTESIS  05/01/2019  . IR PARACENTESIS  05/08/2019  . IR PARACENTESIS  05/24/2019  . IR PARACENTESIS  06/12/2019  . IR PARACENTESIS  07/09/2019  .  IR PARACENTESIS  07/27/2019  . IR PARACENTESIS  08/09/2019  . IR PARACENTESIS  08/21/2019  . IR PARACENTESIS  09/17/2019  . IR PARACENTESIS  10/05/2019  . IR RADIOLOGIST EVAL & MGMT  02/14/2018  . IR RADIOLOGIST EVAL & MGMT  02/22/2019  . LEFT HEART CATH AND CORONARY ANGIOGRAPHY N/A 02/22/2017   Procedure: LEFT HEART CATH AND CORONARY ANGIOGRAPHY;  Surgeon: Nigel Mormon, MD;  Location: [place] [facility];  Service: Cardiovascular;  Laterality: N/A;  . LIGATION OF ARTERIOVENOUS  FISTULA Left 10/18/1854   Procedure: Plication of Left Arm Arteriovenous Fistula;  Surgeon: Elam Dutch, MD;  Location: [place];  Service: Vascular;  Laterality: Left;  . POLYPECTOMY    . POLYPECTOMY  01/25/2018   Procedure: POLYPECTOMY;  Surgeon: Jerene Bears, MD;  Location: [place];  Service: Gastroenterology;;  . REVISON OF ARTERIOVENOUS FISTULA Left 09/22/9700   Procedure: PLICATION OF DISTAL ANEURYSMAL SEGEMENT OF  LEFT UPPER ARM ARTERIOVENOUS FISTULA;  Surgeon: Elam Dutch, MD;  Location: [place];  Service: Vascular;  Laterality: Left;  . REVISON OF ARTERIOVENOUS FISTULA Left 6/37/8588   Procedure: Plication of Left Upper Arm Fistula ;  Surgeon: Waynetta Sandy, MD;  Location: [place];  Service: Vascular;  Laterality: Left;  . SKIN GRAFT SPLIT THICKNESS LEG / FOOT Left    SKIN GRAFT SPLIT THICKNESS LEFT ARM DONOR SITE: LEFT ANTERIOR THIGH  . SKIN SPLIT GRAFT Left 07/04/2017   Procedure: SKIN GRAFT SPLIT THICKNESS LEFT ARM DONOR SITE: LEFT ANTERIOR THIGH;  Surgeon: Elam Dutch, MD;  Location: [place];  Service: Vascular;  Laterality: Left;  . THROMBECTOMY W/ EMBOLECTOMY Left 06/05/2017   Procedure: EXPLORATION OF LEFT ARM FOR BLEEDING; OVERSEWED PROXIMAL FISTULA;  Surgeon: Angelia Mould, MD;  Location: [place];  Service: Vascular;  Laterality: Left;  . WOUND EXPLORATION Left 06/03/2017   Procedure: WOUND EXPLORATION WITH WOUND VAC APPLICATION TO LEFT ARM;  Surgeon: Angelia Mould, MD;  Location: [place];  Service: Vascular;  Laterality: Left;     reports that he has been smoking cigarettes. He started smoking about 46 years ago. He has a 21.50 pack-year smoking history. He has never used smokeless tobacco. He reports previous alcohol use. He reports previous drug use. Drugs: Marijuana and Cocaine.  Allergies  Allergen Reactions  . Tramadol Itching and Other (See Comments)  . Morphine And Related Other (See Comments)    Stomach pain  . Pollen Extract     Other reaction(s): Sneezing (finding)  . Acetaminophen Nausea Only    Stomach ache Other reaction(s): Other (See Comments) Stomach ache  . Aspirin Other (See Comments) and Itching    STOMACH PAIN Other reaction(s): Unknown STOMACH PAIN  . Clonidine Derivatives Itching    Family History  Problem Relation Age of Onset  . Heart disease Mother   . Lung cancer Mother   . Heart disease Father   . Malignant hyperthermia  Father   . COPD Father   . Throat cancer Sister   . Esophageal cancer Sister   . Hypertension Other   . COPD Other   . Colon cancer Neg Hx   . Colon polyps Neg Hx   . Rectal cancer Neg Hx   . Stomach cancer Neg Hx     Prior to Admission medications   Medication Sig Start Date End Date Taking? Authorizing Provider  albuterol (VENTOLIN HFA) 108 (90 Base) MCG/ACT inhaler Inhale 2 puffs into the lungs every 6 (six) hours as needed  for wheezing or shortness of breath.   Yes [provider]  budesonide-formoterol (SYMBICORT) 160-4.5 MCG/ACT inhaler Inhale 2 puffs into the lungs 2 (two) times daily.   Yes [provider]  diltiazem (TIAZAC) 240 MG 24 hr capsule Take 240 mg by mouth daily. 08/16/19   [provider]  hydrALAZINE (APRESOLINE) 100 MG tablet Take 100 mg by mouth 2 (two) times daily.    [provider]  hydrOXYzine (ATARAX/VISTARIL) 25 MG tablet Take 25 mg by mouth 2 (two) times daily as needed for itching.  08/21/19   [provider]  lactulose (CHRONULAC) 10 GM/15ML solution Take 45 mLs (30 g total) by mouth daily as needed for mild constipation. 10/01/19   [name], MD  metoprolol tartrate (LOPRESSOR) 100 MG tablet Take 1 tablet (100 mg total) by mouth 2 (two) times daily. 06/04/19   Al Decant, MD  montelukast (SINGULAIR) 10 MG tablet Take 10 mg by mouth every evening.     [provider]  ondansetron (ZOFRAN) 4 MG tablet Take 4 mg by mouth every 4 (four) hours as needed for nausea or vomiting. 09/18/19   [provider]  Oxycodone HCl 10 MG TABS Take 10 mg by mouth 3 (three) times daily as needed (pain).  08/25/19   [provider]  pantoprazole (PROTONIX) 40 MG tablet Take 40 mg by mouth daily.    [provider]  sevelamer carbonate (RENVELA) 800 MG tablet Take 1,600-3,200 mg by mouth See admin instructions. Take 4 tablets (3200 mg) by mouth 3 times daily with meals and 2 tablets (1600 mg) with  snacks    [provider]  zolpidem (AMBIEN) 10 MG tablet Take 10 mg by mouth at bedtime as needed for sleep.    [provider]    Physical Exam:  Constitutional: Middle-age male who appears older than stated age 26:   10/27/19 0129 10/27/19 0200 10/27/19 0245 10/27/19 0500  BP: (!) 130/104 (!) 131/45 124/61 (!) 148/65  Pulse: 77 77  75  Resp: (!) 24 20 19 12   Temp: 98.7 F (37.1 C)     TempSrc: Oral     SpO2: 99% 99%  97%   Eyes: PERRL, lids and conjunctivae normal ENMT: Mucous membranes are dry.  Posterior pharynx clear of any exudate or lesions.Normal dentition.  Neck: normal, supple, no masses, no thyromegaly Respiratory: clear to auscultation bilaterally, no wheezing, no crackles. Normal respiratory effort. No accessory muscle use.   Cardiovascular: Regular rate and rhythm, no murmurs / rubs / gallops. No extremity edema. 2+ pedal pulses. No carotid bruits.  Abdomen: Abdominal distention appreciated. Musculoskeletal: no clubbing / cyanosis. No joint deformity upper and lower extremities. Good ROM, no contractures. Normal muscle tone.  Skin: no rashes, lesions, ulcers. No induration Neurologic: CN 2-12 grossly intact. Sensation intact, DTR normal. Strength 5/5 in all 4.  Psychiatric: Normal judgment and insight. Alert and oriented x 3. Normal mood.     Labs on Admission: I have personally reviewed following labs and imaging studies  CBC: Recent Labs  Lab 10/20/19 0743 10/21/19 0314 10/22/19 0214 10/23/19 0343 10/27/19 0200  WBC 8.1 8.3 8.7 6.4 9.4  NEUTROABS 5.8 5.8 5.8 4.0 7.7  HGB 8.1* 8.1* 8.6* 8.1* 8.7*  HCT 24.1* 22.4* 24.4* 22.3* 24.6*  MCV 78.5* 82.7 75.8* 79.6* 78.1*  PLT 202 198 234 195 128   Basic Metabolic Panel: Recent Labs  Lab 10/20/19 0743 10/21/19 0314 10/22/19 0214 10/23/19 0343 10/27/19 0200  NA 138  138 138 137 135  K 4.8 4.6 5.6* 5.0 4.4  CL 94* 95* 94* 97* 95*  CO2 28 25 22 25 22   GLUCOSE 98 92 92 43* 101*  BUN  23* 33* 43* 29* 32*  CREATININE 4.30* 5.30* 6.54* 4.89* 5.73*  CALCIUM 9.4 9.5 9.5 9.1 9.0  MG 1.9 2.1  --   --   --    GFR: Estimated Creatinine Clearance: 12.9 mL/min (A) (by C-G formula based on SCr of 5.73 mg/dL (H)). Liver Function Tests: Recent Labs  Lab 10/20/19 0743 10/21/19 0314 10/22/19 0214 10/23/19 0343 10/27/19 0200  AST 29 26 38 36 161*  ALT 18 17 18 16  73*  ALKPHOS 164* 158* 161* 158* 176*  BILITOT 0.9 1.1 1.0 1.3* 1.0  PROT 6.3* 6.4* 6.2* 6.0* 6.1*  ALBUMIN 2.8* 2.7* 2.7* 2.5* 2.7*   Recent Labs  Lab 10/20/19 0743 10/21/19 0314 10/27/19 0200  LIPASE 22 19 22    No results for input(s): AMMONIA in the last 168 hours. Coagulation Profile: Recent Labs  Lab 10/20/19 0743 10/27/19 0200  INR 1.2 1.5*   Cardiac Enzymes: No results for input(s): CKTOTAL, CKMB, CKMBINDEX, TROPONINI in the last 168 hours. BNP (last 3 results) No results for input(s): PROBNP in the last 8760 hours. HbA1C: No results for input(s): HGBA1C in the last 72 hours. CBG: Recent Labs  Lab 10/23/19 0539  GLUCAP 103*   Lipid Profile: No results for input(s): CHOL, HDL, LDLCALC, TRIG, CHOLHDL, LDLDIRECT in the last 72 hours. Thyroid Function Tests: No results for input(s): TSH, T4TOTAL, FREET4, T3FREE, THYROIDAB in the last 72 hours. Anemia Panel: No results for input(s): VITAMINB12, FOLATE, FERRITIN, TIBC, IRON, RETICCTPCT in the last 72 hours. Urine analysis:    Component Value Date/Time   COLORURINE YELLOW 07/16/2011 1619   APPEARANCEUR CLEAR 07/16/2011 1619   LABSPEC 1.018 07/16/2011 1619   PHURINE 7.5 07/16/2011 1619   GLUCOSEU 250 (A) 07/16/2011 1619   HGBUR MODERATE (A) 07/16/2011 1619   BILIRUBINUR SMALL (A) 07/16/2011 1619   KETONESUR NEGATIVE 07/16/2011 1619   PROTEINUR >300 (A) 07/16/2011 1619   UROBILINOGEN 1.0 07/16/2011 1619   NITRITE NEGATIVE 07/16/2011 1619   LEUKOCYTESUR SMALL (A) 07/16/2011 1619   Sepsis Labs: Recent Results (from the past 240 hour(s))   SARS CORONAVIRUS 2 (TAT 6-24 HRS) Nasopharyngeal Nasopharyngeal Swab     Status: None   Collection Time: 10/21/19  6:14 AM   Specimen: Nasopharyngeal Swab  Result Value Ref Range Status   SARS Coronavirus 2 NEGATIVE NEGATIVE Final    Comment: (NOTE) SARS-CoV-2 target nucleic acids are NOT DETECTED. The SARS-CoV-2 RNA is generally detectable in upper and lower respiratory specimens during the acute phase of infection. Negative results do not preclude SARS-CoV-2 infection, do not rule out co-infections with other pathogens, and should not be used as the sole basis for treatment or other patient management decisions. Negative results must be combined with clinical observations, patient history, and epidemiological information. The expected result is Negative. Fact Sheet for Patients: SugarRoll.be Fact Sheet for Healthcare Providers: https://www.woods-mathews.com/ This test is not yet approved or cleared by the [place] FDA and  has been authorized for detection and/or diagnosis of SARS-CoV-2 by FDA under an Emergency Use Authorization (EUA). This EUA will remain  in effect (meaning this test can be used) for the duration of the COVID-19 declaration under Section 56 4(b)(1) of the Act, 21 U.S.C. section 360bbb-3(b)(1), unless the authorization is terminated or revoked sooner. Performed at [facility],  [address] [place], [place] [address]   Gram stain     Status: None   Collection Time: 10/21/19  1:00 PM   Specimen: PATH Cytology Peritoneal fluid  Result Value Ref Range Status   Specimen Description PERITONEAL FLUID  Final   Special Requests NONE  Final   Gram Stain   Final    WBC PRESENT, PREDOMINANTLY MONONUCLEAR NO ORGANISMS SEEN CYTOSPIN SMEAR Performed at [facility], [place] [address] [place], [place] [address]    Report Status 10/21/2019 FINAL  Final  Culture, body fluid-bottle     Status: None   Collection  Time: 10/21/19  1:00 PM   Specimen: Peritoneal Washings  Result Value Ref Range Status   Specimen Description PERITONEAL FLUID  Final   Special Requests NONE  Final   Culture   Final    NO GROWTH 5 DAYS Performed at [facility] [address] [place],  [address]    Report Status 10/26/2019 FINAL  Final     Radiological Exams on Admission: No results found.  EKG: Independently reviewed.  Sinus rhythm at 77 bpm with QTc of 88  Assessment/Plan  Unintentional, acetaminophen overdose elevated liver enzymes: Acute.  On admission patient noted to have elevated Tylenol level of 52.  Reports taking extra strength Tylenol to treat pain symptoms at home after running out of his oxycodone.  Patient had received his first dose of oral acetylcysteine. -Patient requested to leave [place].  He was counseled on risks which could include death, but still requested to leave.  Advised on the need to refrain from Tylenol use.  History of hepatitis B and C: Previously reported to have been treated for hepatitis C. Dr. Benson Norway of GI was initially consulted, but ultimately patient left AMA.  -He will need outpatient follow-up with GI  ESRD on HD: Patient normally dialyzes M/W/F.  Patient had a modified dialysis treatment on 4/18 was reported to be approximately 2 hours long..  Nausea: Patient reports being nauseous. -Scopolamine patch as needed for nausea   Prolonged QT interval: Acute on chronic.  Initial QTc elevated at 588 -Avoid QT prolonging medication  History of polysubstance abuse: Patient still reports smoking tobacco on a regular basis. -Counseled on need to continue to avoid alcohol and illicit drugs  DNR: Present on admission   DVT prophylaxis:  n/a  Code Status: DNR Family Communication: n/a Disposition Plan: Home Consults called: GI stop by the room but patient ultimately quested to leave and was not formally seen. Admission status: Left AMA  Norval Morton MD [facility] Pager [phone]   If 7PM-7AM, please contact night-coverage [email or web address] Password [facility]  10/27/2019, 7:31 AM

## 2019-10-27 NOTE — ED Triage Notes (Signed)
Pt arrives via [facility] from home.   Pt is up to date with his MWF dialysis. Though pt endorses 10/10 abdominal pain. Pt abd distended and tender, hx of hepatomegaly.  Pt states No relief of nausea with "medication"   A&ox4

## 2019-10-28 ENCOUNTER — Emergency Department (HOSPITAL_COMMUNITY)
Admission: EM | Admit: 2019-10-28 | Discharge: 2019-10-29 | Disposition: A | Discharge status: Home / Self Care | Payer: Medicare Other | Attending: Emergency Medicine | Admitting: Emergency Medicine

## 2019-10-28 ENCOUNTER — Encounter (HOSPITAL_COMMUNITY): Payer: Self-pay

## 2019-10-28 ENCOUNTER — Other Ambulatory Visit: Payer: Self-pay

## 2019-10-28 DIAGNOSIS — F17210 Nicotine dependence, cigarettes, uncomplicated: Secondary | ICD-10-CM | POA: Diagnosis not present

## 2019-10-28 DIAGNOSIS — R188 Other ascites: Secondary | ICD-10-CM | POA: Diagnosis not present

## 2019-10-28 DIAGNOSIS — J449 Chronic obstructive pulmonary disease, unspecified: Secondary | ICD-10-CM | POA: Diagnosis not present

## 2019-10-28 DIAGNOSIS — I120 Hypertensive chronic kidney disease with stage 5 chronic kidney disease or end stage renal disease: Secondary | ICD-10-CM | POA: Diagnosis not present

## 2019-10-28 DIAGNOSIS — R945 Abnormal results of liver function studies: Secondary | ICD-10-CM | POA: Insufficient documentation

## 2019-10-28 DIAGNOSIS — D631 Anemia in chronic kidney disease: Secondary | ICD-10-CM | POA: Insufficient documentation

## 2019-10-28 DIAGNOSIS — I2510 Atherosclerotic heart disease of native coronary artery without angina pectoris: Secondary | ICD-10-CM | POA: Diagnosis not present

## 2019-10-28 DIAGNOSIS — Z79899 Other long term (current) drug therapy: Secondary | ICD-10-CM | POA: Diagnosis not present

## 2019-10-28 DIAGNOSIS — N186 End stage renal disease: Secondary | ICD-10-CM | POA: Diagnosis not present

## 2019-10-28 DIAGNOSIS — E875 Hyperkalemia: Secondary | ICD-10-CM | POA: Diagnosis not present

## 2019-10-28 DIAGNOSIS — R7989 Other specified abnormal findings of blood chemistry: Secondary | ICD-10-CM

## 2019-10-28 DIAGNOSIS — R109 Unspecified abdominal pain: Secondary | ICD-10-CM | POA: Diagnosis present

## 2019-10-28 DIAGNOSIS — Z992 Dependence on renal dialysis: Secondary | ICD-10-CM | POA: Diagnosis not present

## 2019-10-28 DIAGNOSIS — N189 Chronic kidney disease, unspecified: Secondary | ICD-10-CM

## 2019-10-28 LAB — CBC
HCT: 26.1 % — ABNORMAL LOW (ref 39.0–52.0)
Hemoglobin: 9.4 g/dL — ABNORMAL LOW (ref 13.0–17.0)
MCH: 29.2 pg (ref 26.0–34.0)
MCHC: 36 g/dL (ref 30.0–36.0)
MCV: 81.1 fL (ref 80.0–100.0)
Platelets: 208 10*3/uL (ref 150–400)
RBC: 3.22 MIL/uL — ABNORMAL LOW (ref 4.22–5.81)
RDW: 20.2 % — ABNORMAL HIGH (ref 11.5–15.5)
WBC: 9 10*3/uL (ref 4.0–10.5)
nRBC: 0.9 % — ABNORMAL HIGH (ref 0.0–0.2)

## 2019-10-28 MED ORDER — SODIUM CHLORIDE 0.9% FLUSH
3.0000 mL | Freq: Once | INTRAVENOUS | Status: AC
  Administered 2019-10-29: 3 mL via INTRAVENOUS

## 2019-10-28 MED ORDER — LIDOCAINE 5 % EX PTCH
1.0000 | MEDICATED_PATCH | CUTANEOUS | Status: DC
  Administered 2019-10-28: 1 via TRANSDERMAL
  Filled 2019-10-28: qty 1

## 2019-10-28 MED ORDER — LIDOCAINE 5 % EX PTCH: 1.0000 | MEDICATED_PATCH | CUTANEOUS | 0 refills | Status: DC

## 2019-10-28 NOTE — ED Notes (Signed)
Pt had a 1"-1 1/2" bedsore on mid right buttock that needed a new bandage. A new bandage with bacitracin was applied.

## 2019-10-28 NOTE — ED Provider Notes (Signed)
[facility] EMERGENCY DEPARTMENT Provider Note   CSN: [id number] Arrival date & time: 10/28/19  2242   History Chief Complaint  Patient presents with  . Abdominal Pain    [name] is a 59 y.o. male.  The history is provided by the patient.  Abdominal Pain He has history of hypertension, paroxysmal atrial fibrillation, end-stage renal disease on hemodialysis and comes in complaining of ongoing abdominal pain and leg swelling.  He has been having abdominal pain and swelling over the last week.  He has chronic ascites and needs periodic paracentesis and he feels that he needs to have another paracentesis done.  He had been in the emergency department yesterday and was sent home.  He rates his pain at 10/10.  He denies nausea or vomiting he denies fever or chills.  He has also noted swelling in his feet over the last week.  Of note, he had to cut his dialysis session short when he had his last session 2 days ago.  He states he completed about 2-1/2 hours of the 4-hour session.  Session had to be terminated because of his abdominal pain and inability to lay still.  Past Medical History:  Diagnosis Date  . Anemia   . Anxiety   . Arthritis    left shoulder  . Atherosclerosis of aorta ([place])   . Cardiomegaly   . Chest pain    DATE UNKNOWN, C/O PERIODICALLY  . Cocaine abuse ([place])   . COPD exacerbation ([place]) 08/17/2016  . Coronary artery disease    stent 02/22/17  . ESRD (end stage renal disease) on dialysis ([place])    "[name]; MWF" (07/04/2017)  . GERD (gastroesophageal reflux disease)    DATE UNKNOWN  . Hemorrhoids   . Hepatitis B, chronic ([place])   . Hepatitis C   . History of kidney stones   . Hyperkalemia   . Hypertension   . Kidney failure   . Metabolic bone disease    Patient denies  . Mitral stenosis   . Myocardial infarction ([place])   . Pneumonia   . Pulmonary edema   . Solitary rectal ulcer syndrome 07/2017   at flex [facility] for rectal  bleeding  . Tubular adenoma of colon     Patient Active Problem List   Diagnosis Date Noted  . Acetaminophen overdose 10/27/2019  . ESRD (end stage renal disease) ([place])   . Goals of care, counseling/discussion   . Hypoxia 10/04/2019  . Advanced care planning/counseling discussion   . Renal failure 09/26/2019  . Dyspnea 06/09/2019  . Acquired thrombophilia ([place]) 06/05/2019  . A-fib ([place]) 05/30/2019  . Atrial fibrillation with RVR ([place]) 05/29/2019  . Melena   . Pressure injury of skin 03/09/2019  . Abdominal distention   . Volume overload 12/28/2018  . Sepsis ([place]) 09/12/2018  . Atherosclerosis of native coronary artery of native heart without angina pectoris 03/11/2018  . Benign neoplasm of cecum   . Benign neoplasm of ascending colon   . Benign neoplasm of descending colon   . Benign neoplasm of rectum   . Paroxysmal atrial fibrillation ([place]) 01/23/2018  . Hx of colonic polyps 01/20/2018  . End-stage renal disease on hemodialysis ([place]) 11/21/2017  . GERD (gastroesophageal reflux disease) 11/16/2017  . Decompensated hepatic cirrhosis ([place]) 11/15/2017  . DNR (do not resuscitate)   . Palliative care by specialist   . Hyponatremia 11/04/2017  . SBP (spontaneous bacterial peritonitis) ([place]) 10/30/2017  . Liver disease, chronic 10/30/2017  .  SOB (shortness of breath)   . Abdominal pain 10/28/2017  . Upper airway cough syndrome with flattening on f/v loop 10/13/17 c/w vcd 10/17/2017  . Elevated diaphragm 10/13/2017  . Ileus ([place]) 09/29/2017  . QT prolongation 09/29/2017  . Malnutrition of moderate degree 09/29/2017  . Sinus congestion 09/03/2017  . Symptomatic anemia 09/02/2017  . Other cirrhosis of liver ([place]) 09/02/2017  . Left bundle branch block 09/02/2017  . Mitral stenosis 09/02/2017  . Hematochezia 07/15/2017  . Wide-complex tachycardia ([place])   . Endotracheally intubated   . ESRD on dialysis ([place]) 07/04/2017  . Acute respiratory failure with hypoxia ([place]) 06/18/2017    . CKD (chronic kidney disease) stage V requiring chronic dialysis ([place]) 06/18/2017  . History of Cocaine abuse ([place]) 06/18/2017  . Hypertension 06/18/2017  . Infection of AV graft for dialysis ([place]) 06/18/2017  . Anxiety 06/18/2017  . Anemia due to chronic kidney disease 06/18/2017  . Atypical atrial flutter ([place]) 06/18/2017  . Personality disorder ([place]) 06/13/2017  . Cellulitis 06/12/2017  . Adjustment disorder with mixed anxiety and depressed mood 06/10/2017  . Suicidal ideation 06/10/2017  . Arm wound, left, sequela 06/10/2017  . Dyspnea on exertion 05/29/2017  . Tachycardia 05/29/2017  . Hyperkalemia 05/22/2017  . Acute metabolic encephalopathy   . Anemia 04/23/2017  . Ascites 04/23/2017  . COPD (chronic obstructive pulmonary disease) ([place]) 04/23/2017  . Acute on chronic respiratory failure with hypoxia ([place]) 03/25/2017  . Arrhythmia 03/25/2017  . COPD GOLD 0 with flattening on inps f/v  09/27/2016  . Essential hypertension 09/27/2016  . Fluid overload 08/30/2016  . COPD exacerbation ([place]) 08/17/2016  . Hypertensive urgency 08/17/2016  . Respiratory failure ([place]) 08/17/2016  . Problem with dialysis access ([place]) 07/23/2016  . Chronic hepatitis B ([place]) 03/05/2014  . Chronic hepatitis C without hepatic coma ([place]) 03/05/2014  . Internal hemorrhoids with bleeding, swelling and itching 03/05/2014  . Thrombocytopenia ([place]) 03/05/2014  . Chest pain 02/27/2014  . Alcohol abuse 04/14/2009  . Cigarette smoker 04/14/2009  . GANGLION CYST 04/14/2009    Past Surgical History:  Procedure Laterality Date  . A/V FISTULAGRAM Left 05/26/2017   Procedure: A/V FISTULAGRAM;  Surgeon: Conrad Gardiner, MD;  Location: [facility];  Service: Cardiovascular;  Laterality: Left;  . A/V FISTULAGRAM Right 11/18/2017   Procedure: A/V FISTULAGRAM - Right Arm;  Surgeon: Elam Dutch, MD;  Location: [facility];  Service: Cardiovascular;  Laterality: Right;  . APPLICATION OF WOUND VAC  Left 06/14/2017   Procedure: APPLICATION OF WOUND VAC;  Surgeon: Katha Cabal, MD;  Location: [facility];  Service: Vascular;  Laterality: Left;  . AV FISTULA PLACEMENT  2012   BELIEVED WAS PLACED IN JUNE  . AV FISTULA PLACEMENT Right 08/09/2017   Procedure: Creation Right arm ARTERIOVENOUS BRACHIOCEPOHALIC FISTULA;  Surgeon: Elam Dutch, MD;  Location: [facility] OR;  Service: Vascular;  Laterality: Right;  . AV FISTULA PLACEMENT Right 11/22/2017   Procedure: INSERTION OF ARTERIOVENOUS (AV) GORE-TEX GRAFT RIGHT UPPER ARM;  Surgeon: Elam Dutch, MD;  Location: [facility];  Service: Vascular;  Laterality: Right;  . BIOPSY  01/25/2018   Procedure: BIOPSY;  Surgeon: Jerene Bears, MD;  Location: [facility];  Service: Gastroenterology;;  . BIOPSY  04/10/2019   Procedure: BIOPSY;  Surgeon: Jerene Bears, MD;  Location: [facility];  Service: Gastroenterology;;  . COLONOSCOPY    . COLONOSCOPY WITH PROPOFOL N/A 01/25/2018   Procedure: COLONOSCOPY WITH PROPOFOL;  Surgeon: Jerene Bears,  MD;  Location: [facility];  Service: Gastroenterology;  Laterality: N/A;  . CORONARY STENT INTERVENTION N/A 02/22/2017   Procedure: CORONARY STENT INTERVENTION;  Surgeon: Nigel Mormon, MD;  Location: [facility];  Service: Cardiovascular;  Laterality: N/A;  . ESOPHAGOGASTRODUODENOSCOPY (EGD) WITH PROPOFOL N/A 01/25/2018   Procedure: ESOPHAGOGASTRODUODENOSCOPY (EGD) WITH PROPOFOL;  Surgeon: Jerene Bears, MD;  Location: [facility];  Service: Gastroenterology;  Laterality: N/A;  . ESOPHAGOGASTRODUODENOSCOPY (EGD) WITH PROPOFOL N/A 04/10/2019   Procedure: ESOPHAGOGASTRODUODENOSCOPY (EGD) WITH PROPOFOL;  Surgeon: Jerene Bears, MD;  [facility];  Service: Gastroenterology;  Laterality: N/A;  . FLEXIBLE SIGMOIDOSCOPY N/A 07/15/2017   Procedure: FLEXIBLE SIGMOIDOSCOPY;  Surgeon: Carol Ada, MD;  Location: [facility];  Service: Endoscopy;  Laterality: N/A;  . HEMORRHOID BANDING    . I & D  EXTREMITY Left 06/01/2017   Procedure: IRRIGATION AND DEBRIDEMENT LEFT ARM HEMATOMA WITH LIGATION OF LEFT ARM AV FISTULA;  Surgeon: Elam Dutch, MD;  Location: [facility];  Service: Vascular;  Laterality: Left;  . I & D EXTREMITY Left 06/14/2017   Procedure: IRRIGATION AND DEBRIDEMENT EXTREMITY;  Surgeon: Katha Cabal, MD;  Location: [facility];  Service: Vascular;  Laterality: Left;  . INSERTION OF DIALYSIS CATHETER  05/30/2017  . INSERTION OF DIALYSIS CATHETER N/A 05/30/2017   Procedure: INSERTION OF DIALYSIS CATHETER;  Surgeon: Elam Dutch, MD;  Location: [facility];  Service: Vascular;  Laterality: N/A;  . IR PARACENTESIS  08/30/2017  . IR PARACENTESIS  09/29/2017  . IR PARACENTESIS  10/28/2017  . IR PARACENTESIS  11/09/2017  . IR PARACENTESIS  11/16/2017  . IR PARACENTESIS  11/28/2017  . IR PARACENTESIS  12/01/2017  . IR PARACENTESIS  12/06/2017  . IR PARACENTESIS  01/03/2018  . IR PARACENTESIS  01/23/2018  . IR PARACENTESIS  02/07/2018  . IR PARACENTESIS  02/21/2018  . IR PARACENTESIS  03/06/2018  . IR PARACENTESIS  03/17/2018  . IR PARACENTESIS  04/04/2018  . IR PARACENTESIS  12/28/2018  . IR PARACENTESIS  01/08/2019  . IR PARACENTESIS  01/23/2019  . IR PARACENTESIS  02/01/2019  . IR PARACENTESIS  02/19/2019  . IR PARACENTESIS  03/01/2019  . IR PARACENTESIS  03/15/2019  . IR PARACENTESIS  04/03/2019  . IR PARACENTESIS  04/12/2019  . IR PARACENTESIS  05/01/2019  . IR PARACENTESIS  05/08/2019  . IR PARACENTESIS  05/24/2019  . IR PARACENTESIS  06/12/2019  . IR PARACENTESIS  07/09/2019  . IR PARACENTESIS  07/27/2019  . IR PARACENTESIS  08/09/2019  . IR PARACENTESIS  08/21/2019  . IR PARACENTESIS  09/17/2019  . IR PARACENTESIS  10/05/2019  . IR RADIOLOGIST EVAL & MGMT  02/14/2018  . IR RADIOLOGIST EVAL & MGMT  02/22/2019  . LEFT HEART CATH AND CORONARY ANGIOGRAPHY N/A 02/22/2017   Procedure: LEFT HEART CATH AND CORONARY ANGIOGRAPHY;  Surgeon: Nigel Mormon, MD;  Location: [facility];   Service: Cardiovascular;  Laterality: N/A;  . LIGATION OF ARTERIOVENOUS  FISTULA Left 0/09/90   Procedure: Plication of Left Arm Arteriovenous Fistula;  Surgeon: Elam Dutch, MD;  Location: [facility];  Service: Vascular;  Laterality: Left;  . POLYPECTOMY    . POLYPECTOMY  01/25/2018   Procedure: POLYPECTOMY;  Surgeon: Jerene Bears, MD;  Location: [facility];  Service: Gastroenterology;;  . REVISON OF ARTERIOVENOUS FISTULA Left 10/08/760   Procedure: PLICATION OF DISTAL ANEURYSMAL SEGEMENT OF LEFT UPPER ARM ARTERIOVENOUS FISTULA;  Surgeon: Elam Dutch, MD;  Location: [facility];  Service: Vascular;  Laterality: Left;  . REVISON OF ARTERIOVENOUS FISTULA Left 7/49/4496   Procedure: Plication of Left Upper Arm Fistula ;  Surgeon: Waynetta Sandy, MD;  Location: [facility];  Service: Vascular;  Laterality: Left;  . SKIN GRAFT SPLIT THICKNESS LEG / FOOT Left    SKIN GRAFT SPLIT THICKNESS LEFT ARM DONOR SITE: LEFT ANTERIOR THIGH  . SKIN SPLIT GRAFT Left 07/04/2017   Procedure: SKIN GRAFT SPLIT THICKNESS LEFT ARM DONOR SITE: LEFT ANTERIOR THIGH;  Surgeon: Elam Dutch, MD;  Location: [facility];  Service: Vascular;  Laterality: Left;  . THROMBECTOMY W/ EMBOLECTOMY Left 06/05/2017   Procedure: EXPLORATION OF LEFT ARM FOR BLEEDING; OVERSEWED PROXIMAL FISTULA;  Surgeon: Angelia Mould, MD;  Location: [facility];  Service: Vascular;  Laterality: Left;  . WOUND EXPLORATION Left 06/03/2017   Procedure: WOUND EXPLORATION WITH WOUND VAC APPLICATION TO LEFT ARM;  Surgeon: Angelia Mould, MD;  Location: [facility] OR;  Service: Vascular;  Laterality: Left;       Family History  Problem Relation Age of Onset  . Heart disease Mother   . Lung cancer Mother   . Heart disease Father   . Malignant hyperthermia Father   . COPD Father   . Throat cancer Sister   . Esophageal cancer Sister   . Hypertension Other   . COPD Other   . Colon cancer Neg Hx   . Colon polyps Neg Hx   . Rectal cancer Neg Hx    . Stomach cancer Neg Hx     Social History   Tobacco Use  . Smoking status: Current Every Day Smoker    Packs/day: 0.50    Years: 43.00    Pack years: 21.50    Types: Cigarettes    Start date: 08/13/1973  . Smokeless tobacco: Never Used  . Tobacco comment: i dont know i just make   Substance Use Topics  . Alcohol use: Not Currently    Comment: quit drinking in 2017  . Drug use: Not Currently    Types: Marijuana, Cocaine    Comment: reports using once every 3 months, 04-06-2019 was this     Home Medications Prior to Admission medications   Medication Sig Start Date End Date Taking? Authorizing Provider  albuterol (VENTOLIN HFA) 108 (90 Base) MCG/ACT inhaler Inhale 2 puffs into the lungs every 6 (six) hours as needed for wheezing or shortness of breath.    [provider]  budesonide-formoterol (SYMBICORT) 160-4.5 MCG/ACT inhaler Inhale 2 puffs into the lungs 2 (two) times daily.    [provider]  diltiazem (TIAZAC) 240 MG 24 hr capsule Take 240 mg by mouth daily. 08/16/19   [provider]  hydrALAZINE (APRESOLINE) 100 MG tablet Take 100 mg by mouth 2 (two) times daily.    [provider]  hydrOXYzine (ATARAX/VISTARIL) 25 MG tablet Take 25 mg by mouth 2 (two) times daily as needed for itching.  08/21/19   [provider]  lactulose (CHRONULAC) 10 GM/15ML solution Take 45 mLs (30 g total) by mouth daily as needed for mild constipation. 10/01/19   [name], MD  lidocaine (LIDODERM) 5 % Place 1 patch onto the skin daily. Remove & Discard patch within 12 hours or as directed by MD 10/28/19   McDonald, Mia A, PA-C  metoprolol tartrate (LOPRESSOR) 100 MG tablet Take 1 tablet (100 mg total) by mouth 2 (two) times daily. 06/04/19   Al Decant, MD  montelukast (SINGULAIR) 10 MG tablet Take  10 mg by mouth every evening.     [provider]  ondansetron (ZOFRAN) 4 MG tablet Take 4 mg by mouth every 4 (four) hours as needed for  nausea or vomiting. 09/18/19   [provider]  Oxycodone HCl 10 MG TABS Take 10 mg by mouth 3 (three) times daily as needed (pain).  08/25/19   [provider]  pantoprazole (PROTONIX) 40 MG tablet Take 40 mg by mouth daily.    [provider]  sevelamer carbonate (RENVELA) 800 MG tablet Take 1,600-3,200 mg by mouth See admin instructions. Take 4 tablets (3200 mg) by mouth 3 times daily with meals and 2 tablets (1600 mg) with snacks    [provider]  zolpidem (AMBIEN) 10 MG tablet Take 10 mg by mouth at bedtime as needed for sleep.    [provider]    Allergies    Tramadol, Morphine and related, Pollen extract, Acetaminophen, Aspirin, and Clonidine derivatives  Review of Systems   Review of Systems  Gastrointestinal: Positive for abdominal pain.  All other systems reviewed and are negative.   Physical Exam Updated Vital Signs BP 138/81   Pulse (!) 119   Resp 13   SpO2 99%   Physical Exam Vitals and nursing note reviewed.   59 year old male, resting comfortably and in no acute distress. Vital signs are significant for rapid heart rate. Oxygen saturation is 99%, which is normal. Head is normocephalic and atraumatic. PERRLA, EOMI. Oropharynx is clear. Neck is nontender and supple without adenopathy or JVD. Back is nontender and there is no CVA tenderness. Lungs are clear without rales, wheezes, or rhonchi. Chest is nontender. Heart is tachycardic without murmur. Abdomen is soft, protuberant, nontender without masses or hepatosplenomegaly and peristalsis is normoactive.  Positive fluid wave noted. Extremities have 2+ pedal edema, full range of motion is present.  AV fistula present right arm with thrill present. Skin is warm and dry without rash. Neurologic: Mental status is normal, cranial nerves are intact, there are no motor or sensory deficits.  ED Results / Procedures / Treatments   Labs (all labs ordered are listed, but only  abnormal results are displayed) Labs Reviewed  COMPREHENSIVE METABOLIC PANEL - Abnormal; Notable for the following components:      Result Value   Sodium 133 (*)    Potassium 6.1 (*)    Chloride 94 (*)    CO2 19 (*)    BUN 55 (*)    Creatinine, Ser 7.31 (*)    Total Protein 6.2 (*)    Albumin 2.7 (*)    AST 184 (*)    ALT 99 (*)    Alkaline Phosphatase 177 (*)    GFR calc non Af Amer 8 (*)    GFR calc Af Amer 9 (*)    Anion gap 20 (*)    All other components within normal limits  CBC - Abnormal; Notable for the following components:   RBC 3.22 (*)    Hemoglobin 9.4 (*)    HCT 26.1 (*)    RDW 20.2 (*)    nRBC 0.9 (*)    All other components within normal limits  LIPASE, BLOOD    EKG EKG Interpretation  Date/Time:  Sunday October 28 2019 22:52:42 EDT Ventricular Rate:  122 PR Interval:    QRS Duration: 188 QT Interval:  424 QTC Calculation: 604 R Axis:   -100 Text Interpretation: Wide QRS tachycardia Right superior axis deviation Non-specific intra-ventricular conduction block Minimal  voltage criteria for LVH, may be normal variant ( Cornell product ) Abnormal ECG Tall T waves worrisome for hyperkalemia Prolonged QT When compared with ECG of 10/27/2019, HEART RATE has increased QT has lengthened Tall T waves are now present, worrisome for hyperkalemia Confirmed by Delora Fuel ([id number]) on 10/28/2019 11:06:33 PM  Procedures Procedures   Medications Ordered in ED Medications  sodium chloride flush (NS) 0.9 % injection 3 mL (has no administration in time range)    ED Course  I have reviewed the triage vital signs and the nursing notes.  Pertinent labs & imaging results that were available during my care of the patient were reviewed by me and considered in my medical decision making (see chart for details).  MDM Rules/Calculators/A&P Recurrent ascites.  Will need therapeutic paracentesis.  Pedal edema, will need to be addressed through dialysis.  ECG had been obtained at  triage, concerning for hyperkalemia.  Electrolytes are pending.  He has anemia present with slight drop since last ED visit within his usual range.  12:02 AM Potassium has come back only mildly elevated at 6.1.  He will be given oral potassium binding resin.  Elevated transaminases and alkaline phosphatase are essentially unchanged.  I have discussed his situation with Dr. Earleen Newport, interventional radiologist.  He will will have paracentesis done in the morning as an add-on patient.   He has continued to require pain medication for his abdominal pain.  He is currently awaiting paracentesis.  Case is signed out to Dr. Alvino Chapel.  Final Clinical Impression(s) / ED Diagnoses Final diagnoses:  Ascites  Other ascites  Hyperkalemia  End-stage renal disease on hemodialysis ([place])  Elevated liver function tests  Anemia associated with chronic renal failure    Rx / DC Orders ED Discharge Orders    None       Delora Fuel, MD 97/02/63 [phone]

## 2019-10-28 NOTE — ED Notes (Signed)
Patient verbalizes understanding of discharge instructions. Opportunity for questioning and answers were provided. Armband removed by staff, pt discharged from ED stable & ambulatory  

## 2019-10-28 NOTE — ED Provider Notes (Signed)
[facility] EMERGENCY DEPARTMENT Provider Note   CSN: [id number] Arrival date & time: 10/27/19  2028     History Chief Complaint  Patient presents with  . Nausea    [name] is a 59 y.o. male with a history of ESRD on HD, GERD, hemorrhoids, chronic hepatitis B, hepatitis C, COPD, cocaine use disorder who presents to the emergency department by EMS.  The patient presents to the emergency department for his 36th visit in the last 6 months.  He endorses nausea and vomiting to triage, but denies these complaints for me.  His primary complaint is abdominal pain.  He is concerned that this may be secondary to accidental Tylenol overdose.  He was admitted for overdose yesterday, but left AMA.  He returns today for worsening pain, which he suspects is secondary to Tylenol ingestion.  He is requesting Dilaudid for his pain.  He was last dialyzed on 4/16.  Denies shortness of breath, cough, fever, chills, diarrhea, constipation, numbness, weakness.  He has not taken any additional Tylenol since his last admission.  Denies recent alcohol use.  States "I don't use any alcohol or drugs."  The history is provided by the patient. No language interpreter was used.       Past Medical History:  Diagnosis Date  . Anemia   . Anxiety   . Arthritis    left shoulder  . Atherosclerosis of aorta ([place])   . Cardiomegaly   . Chest pain    DATE UNKNOWN, C/O PERIODICALLY  . Cocaine abuse ([place])   . COPD exacerbation ([place]) 08/17/2016  . Coronary artery disease    stent 02/22/17  . ESRD (end stage renal disease) on dialysis ([place])    "[name]; MWF" (07/04/2017)  . GERD (gastroesophageal reflux disease)    DATE UNKNOWN  . Hemorrhoids   . Hepatitis B, chronic ([place])   . Hepatitis C   . History of kidney stones   . Hyperkalemia   . Hypertension   . Kidney failure   . Metabolic bone disease    Patient denies  . Mitral stenosis   . Myocardial infarction ([place])   .  Pneumonia   . Pulmonary edema   . Solitary rectal ulcer syndrome 07/2017   at flex sig for rectal bleeding  . Tubular adenoma of colon     Patient Active Problem List   Diagnosis Date Noted  . Acetaminophen overdose 10/27/2019  . ESRD (end stage renal disease) ([place])   . Goals of care, counseling/discussion   . Hypoxia 10/04/2019  . Advanced care planning/counseling discussion   . Renal failure 09/26/2019  . Dyspnea 06/09/2019  . Acquired thrombophilia ([place]) 06/05/2019  . A-fib ([place]) 05/30/2019  . Atrial fibrillation with RVR ([place]) 05/29/2019  . Melena   . Pressure injury of skin 03/09/2019  . Abdominal distention   . Volume overload 12/28/2018  . Sepsis ([place]) 09/12/2018  . Atherosclerosis of native coronary artery of native heart without angina pectoris 03/11/2018  . Benign neoplasm of cecum   . Benign neoplasm of ascending colon   . Benign neoplasm of descending colon   . Benign neoplasm of rectum   . Paroxysmal atrial fibrillation ([place]) 01/23/2018  . Hx of colonic polyps 01/20/2018  . End-stage renal disease on hemodialysis ([place]) 11/21/2017  . GERD (gastroesophageal reflux disease) 11/16/2017  . Decompensated hepatic cirrhosis ([place]) 11/15/2017  . DNR (do not resuscitate)   . Palliative care by specialist   . Hyponatremia 11/04/2017  .  SBP (spontaneous bacterial peritonitis) ([place]) 10/30/2017  . Liver disease, chronic 10/30/2017  . SOB (shortness of breath)   . Abdominal pain 10/28/2017  . Upper airway cough syndrome with flattening on f/v loop 10/13/17 c/w vcd 10/17/2017  . Elevated diaphragm 10/13/2017  . Ileus ([place]) 09/29/2017  . QT prolongation 09/29/2017  . Malnutrition of moderate degree 09/29/2017  . Sinus congestion 09/03/2017  . Symptomatic anemia 09/02/2017  . Other cirrhosis of liver ([place]) 09/02/2017  . Left bundle branch block 09/02/2017  . Mitral stenosis 09/02/2017  . Hematochezia 07/15/2017  . Wide-complex tachycardia ([place])   . Endotracheally  intubated   . ESRD on dialysis ([place]) 07/04/2017  . Acute respiratory failure with hypoxia ([place]) 06/18/2017  . CKD (chronic kidney disease) stage V requiring chronic dialysis ([place]) 06/18/2017  . History of Cocaine abuse ([place]) 06/18/2017  . Hypertension 06/18/2017  . Infection of AV graft for dialysis ([place]) 06/18/2017  . Anxiety 06/18/2017  . Anemia due to chronic kidney disease 06/18/2017  . Atypical atrial flutter ([place]) 06/18/2017  . Personality disorder ([place]) 06/13/2017  . Cellulitis 06/12/2017  . Adjustment disorder with mixed anxiety and depressed mood 06/10/2017  . Suicidal ideation 06/10/2017  . Arm wound, left, sequela 06/10/2017  . Dyspnea on exertion 05/29/2017  . Tachycardia 05/29/2017  . Hyperkalemia 05/22/2017  . Acute metabolic encephalopathy   . Anemia 04/23/2017  . Ascites 04/23/2017  . COPD (chronic obstructive pulmonary disease) ([place]) 04/23/2017  . Acute on chronic respiratory failure with hypoxia ([place]) 03/25/2017  . Arrhythmia 03/25/2017  . COPD GOLD 0 with flattening on inps f/v  09/27/2016  . Essential hypertension 09/27/2016  . Fluid overload 08/30/2016  . COPD exacerbation ([place]) 08/17/2016  . Hypertensive urgency 08/17/2016  . Respiratory failure ([place]) 08/17/2016  . Problem with dialysis access ([place]) 07/23/2016  . Chronic hepatitis B ([place]) 03/05/2014  . Chronic hepatitis C without hepatic coma ([place]) 03/05/2014  . Internal hemorrhoids with bleeding, swelling and itching 03/05/2014  . Thrombocytopenia ([place]) 03/05/2014  . Chest pain 02/27/2014  . Alcohol abuse 04/14/2009  . Cigarette smoker 04/14/2009  . GANGLION CYST 04/14/2009    Past Surgical History:  Procedure Laterality Date  . A/V FISTULAGRAM Left 05/26/2017   Procedure: A/V FISTULAGRAM;  Surgeon: Conrad Blair, MD;  Location: [facility];  Service: Cardiovascular;  Laterality: Left;  . A/V FISTULAGRAM Right 11/18/2017   Procedure: A/V FISTULAGRAM - Right Arm;  Surgeon: Elam Dutch,  MD;  Location: [place];  Service: Cardiovascular;  Laterality: Right;  . APPLICATION OF WOUND VAC Left 06/14/2017   Procedure: APPLICATION OF WOUND VAC;  Surgeon: Katha Cabal, MD;  Location: [facility];  Service: Vascular;  Laterality: Left;  . AV FISTULA PLACEMENT  2012   BELIEVED WAS PLACED IN JUNE  . AV FISTULA PLACEMENT Right 08/09/2017   Procedure: Creation Right arm ARTERIOVENOUS BRACHIOCEPOHALIC FISTULA;  Surgeon: Elam Dutch, MD;  Location: [facility];  Service: Vascular;  Laterality: Right;  . AV FISTULA PLACEMENT Right 11/22/2017   Procedure: INSERTION OF ARTERIOVENOUS (AV) GORE-TEX GRAFT RIGHT UPPER ARM;  Surgeon: Elam Dutch, MD;  Location: [facility];  Service: Vascular;  Laterality: Right;  . BIOPSY  01/25/2018   Procedure: BIOPSY;  Surgeon: Jerene Bears, MD;  Location: [facility];  Service: Gastroenterology;;  . BIOPSY  04/10/2019   Procedure: BIOPSY;  Surgeon: Jerene Bears, MD;  Location: [facility];  Service: Gastroenterology;;  . COLONOSCOPY    . COLONOSCOPY WITH PROPOFOL  N/A 01/25/2018   Procedure: COLONOSCOPY WITH PROPOFOL;  Surgeon: Jerene Bears, MD;  Location: [place];  Service: Gastroenterology;  Laterality: N/A;  . CORONARY STENT INTERVENTION N/A 02/22/2017   Procedure: CORONARY STENT INTERVENTION;  Surgeon: Nigel Mormon, MD;  Location: [facility];  Service: Cardiovascular;  Laterality: N/A;  . ESOPHAGOGASTRODUODENOSCOPY (EGD) WITH PROPOFOL N/A 01/25/2018   Procedure: ESOPHAGOGASTRODUODENOSCOPY (EGD) WITH PROPOFOL;  Surgeon: Jerene Bears, MD;  Location: [place] [facility];  Service: Gastroenterology;  Laterality: N/A;  . ESOPHAGOGASTRODUODENOSCOPY (EGD) WITH PROPOFOL N/A 04/10/2019   Procedure: ESOPHAGOGASTRODUODENOSCOPY (EGD) WITH PROPOFOL;  Surgeon: Jerene Bears, MD;  Location: [facility];  Service: Gastroenterology;  Laterality: N/A;  . FLEXIBLE SIGMOIDOSCOPY N/A 07/15/2017   Procedure: FLEXIBLE SIGMOIDOSCOPY;  Surgeon: Carol Ada,  MD;  Location: [facility];  Service: Endoscopy;  Laterality: N/A;  . HEMORRHOID BANDING    . I & D EXTREMITY Left 06/01/2017   Procedure: IRRIGATION AND DEBRIDEMENT LEFT ARM HEMATOMA WITH LIGATION OF LEFT ARM AV FISTULA;  Surgeon: Elam Dutch, MD;  Location: [place];  Service: Vascular;  Laterality: Left;  . I & D EXTREMITY Left 06/14/2017   Procedure: IRRIGATION AND DEBRIDEMENT EXTREMITY;  Surgeon: Katha Cabal, MD;  Location: [facility];  Service: Vascular;  Laterality: Left;  . INSERTION OF DIALYSIS CATHETER  05/30/2017  . INSERTION OF DIALYSIS CATHETER N/A 05/30/2017   Procedure: INSERTION OF DIALYSIS CATHETER;  Surgeon: Elam Dutch, MD;  Location: [place];  Service: Vascular;  Laterality: N/A;  . IR PARACENTESIS  08/30/2017  . IR PARACENTESIS  09/29/2017  . IR PARACENTESIS  10/28/2017  . IR PARACENTESIS  11/09/2017  . IR PARACENTESIS  11/16/2017  . IR PARACENTESIS  11/28/2017  . IR PARACENTESIS  12/01/2017  . IR PARACENTESIS  12/06/2017  . IR PARACENTESIS  01/03/2018  . IR PARACENTESIS  01/23/2018  . IR PARACENTESIS  02/07/2018  . IR PARACENTESIS  02/21/2018  . IR PARACENTESIS  03/06/2018  . IR PARACENTESIS  03/17/2018  . IR PARACENTESIS  04/04/2018  . IR PARACENTESIS  12/28/2018  . IR PARACENTESIS  01/08/2019  . IR PARACENTESIS  01/23/2019  . IR PARACENTESIS  02/01/2019  . IR PARACENTESIS  02/19/2019  . IR PARACENTESIS  03/01/2019  . IR PARACENTESIS  03/15/2019  . IR PARACENTESIS  04/03/2019  . IR PARACENTESIS  04/12/2019  . IR PARACENTESIS  05/01/2019  . IR PARACENTESIS  05/08/2019  . IR PARACENTESIS  05/24/2019  . IR PARACENTESIS  06/12/2019  . IR PARACENTESIS  07/09/2019  . IR PARACENTESIS  07/27/2019  . IR PARACENTESIS  08/09/2019  . IR PARACENTESIS  08/21/2019  . IR PARACENTESIS  09/17/2019  . IR PARACENTESIS  10/05/2019  . IR RADIOLOGIST EVAL & MGMT  02/14/2018  . IR RADIOLOGIST EVAL & MGMT  02/22/2019  . LEFT HEART CATH AND CORONARY ANGIOGRAPHY N/A 02/22/2017   Procedure: LEFT HEART  CATH AND CORONARY ANGIOGRAPHY;  Surgeon: Nigel Mormon, MD;  Location: [facility];  Service: Cardiovascular;  Laterality: N/A;  . LIGATION OF ARTERIOVENOUS  FISTULA Left 7/0/6237   Procedure: Plication of Left Arm Arteriovenous Fistula;  Surgeon: Elam Dutch, MD;  Location: [facility];  Service: Vascular;  Laterality: Left;  . POLYPECTOMY    . POLYPECTOMY  01/25/2018   Procedure: POLYPECTOMY;  Surgeon: Jerene Bears, MD;  Location: [facility];  Service: Gastroenterology;;  . REVISON OF ARTERIOVENOUS FISTULA Left 01/06/3150   Procedure: PLICATION OF DISTAL ANEURYSMAL SEGEMENT OF LEFT UPPER  ARM ARTERIOVENOUS FISTULA;  Surgeon: Elam Dutch, MD;  Location: [facility];  Service: Vascular;  Laterality: Left;  . REVISON OF ARTERIOVENOUS FISTULA Left 11/19/2583   Procedure: Plication of Left Upper Arm Fistula ;  Surgeon: Waynetta Sandy, MD;  Location: [facility];  Service: Vascular;  Laterality: Left;  . SKIN GRAFT SPLIT THICKNESS LEG / FOOT Left    SKIN GRAFT SPLIT THICKNESS LEFT ARM DONOR SITE: LEFT ANTERIOR THIGH  . SKIN SPLIT GRAFT Left 07/04/2017   Procedure: SKIN GRAFT SPLIT THICKNESS LEFT ARM DONOR SITE: LEFT ANTERIOR THIGH;  Surgeon: Elam Dutch, MD;  Location: [facility];  Service: Vascular;  Laterality: Left;  . THROMBECTOMY W/ EMBOLECTOMY Left 06/05/2017   Procedure: EXPLORATION OF LEFT ARM FOR BLEEDING; OVERSEWED PROXIMAL FISTULA;  Surgeon: Angelia Mould, MD;  Location: [facility];  Service: Vascular;  Laterality: Left;  . WOUND EXPLORATION Left 06/03/2017   Procedure: WOUND EXPLORATION WITH WOUND VAC APPLICATION TO LEFT ARM;  Surgeon: Angelia Mould, MD;  Location: [facility];  Service: Vascular;  Laterality: Left;       Family History  Problem Relation Age of Onset  . Heart disease Mother   . Lung cancer Mother   . Heart disease Father   . Malignant hyperthermia Father   . COPD Father   . Throat cancer Sister   . Esophageal cancer Sister   .  Hypertension Other   . COPD Other   . Colon cancer Neg Hx   . Colon polyps Neg Hx   . Rectal cancer Neg Hx   . Stomach cancer Neg Hx     Social History   Tobacco Use  . Smoking status: Current Every Day Smoker    Packs/day: 0.50    Years: 43.00    Pack years: 21.50    Types: Cigarettes    Start date: 08/13/1973  . Smokeless tobacco: Never Used  . Tobacco comment: i dont know i just make   Substance Use Topics  . Alcohol use: Not Currently    Comment: quit drinking in 2017  . Drug use: Not Currently    Types: Marijuana, Cocaine    Comment: reports using once every 3 months, 04-06-2019 was this     Home Medications Prior to Admission medications   Medication Sig Start Date End Date Taking? Authorizing Provider  albuterol (VENTOLIN HFA) 108 (90 Base) MCG/ACT inhaler Inhale 2 puffs into the lungs every 6 (six) hours as needed for wheezing or shortness of breath.    [provider]  budesonide-formoterol (SYMBICORT) 160-4.5 MCG/ACT inhaler Inhale 2 puffs into the lungs 2 (two) times daily.    [provider]  diltiazem (TIAZAC) 240 MG 24 hr capsule Take 240 mg by mouth daily. 08/16/19   [provider]  hydrALAZINE (APRESOLINE) 100 MG tablet Take 100 mg by mouth 2 (two) times daily.    [provider]  hydrOXYzine (ATARAX/VISTARIL) 25 MG tablet Take 25 mg by mouth 2 (two) times daily as needed for itching.  08/21/19   [provider]  lactulose (CHRONULAC) 10 GM/15ML solution Take 45 mLs (30 g total) by mouth daily as needed for mild constipation. 10/01/19   [name], MD  lidocaine (LIDODERM) 5 % Place 1 patch onto the skin daily. Remove & Discard patch within 12 hours or as directed by MD 10/28/19   Quetzalli Clos A, PA-C  metoprolol tartrate (LOPRESSOR) 100 MG tablet Take 1 tablet (100 mg total) by mouth 2 (two) times  daily. 06/04/19   Al Decant, MD  montelukast (SINGULAIR) 10 MG tablet Take 10 mg by mouth every evening.      [provider]  ondansetron (ZOFRAN) 4 MG tablet Take 4 mg by mouth every 4 (four) hours as needed for nausea or vomiting. 09/18/19   [provider]  Oxycodone HCl 10 MG TABS Take 10 mg by mouth 3 (three) times daily as needed (pain).  08/25/19   [provider]  pantoprazole (PROTONIX) 40 MG tablet Take 40 mg by mouth daily.    [provider]  sevelamer carbonate (RENVELA) 800 MG tablet Take 1,600-3,200 mg by mouth See admin instructions. Take 4 tablets (3200 mg) by mouth 3 times daily with meals and 2 tablets (1600 mg) with snacks    [provider]  zolpidem (AMBIEN) 10 MG tablet Take 10 mg by mouth at bedtime as needed for sleep.    [provider]    Allergies    Tramadol, Morphine and related, Pollen extract, Acetaminophen, Aspirin, and Clonidine derivatives  Review of Systems   Review of Systems  Constitutional: Negative for appetite change and fever.  Respiratory: Negative for cough and shortness of breath.   Cardiovascular: Negative for chest pain.  Gastrointestinal: Positive for abdominal distention and abdominal pain. Negative for diarrhea, nausea and vomiting.  Genitourinary: Negative for dysuria.  Musculoskeletal: Negative for back pain and myalgias.  Skin: Negative for rash.  Allergic/Immunologic: Negative for immunocompromised state.  Neurological: Negative for weakness, numbness and headaches.  Psychiatric/Behavioral: Negative for confusion.    Physical Exam Updated Vital Signs BP 118/80   Pulse (!) 110   Temp 98.4 F (36.9 C) (Oral)   Resp 18   SpO2 99%   Physical Exam Vitals and nursing note reviewed.  Constitutional:      General: He is not in acute distress.    Appearance: He is well-developed. He is not diaphoretic.     Comments: Chronically ill-appearing.  No acute distress.  Patient is uncooperative on exam.  HENT:     Head: Normocephalic.  Eyes:     Conjunctiva/sclera: Conjunctivae normal.    Cardiovascular:     Rate and Rhythm: Normal rate and regular rhythm.     Heart sounds: No murmur.  Pulmonary:     Effort: Pulmonary effort is normal.     Comments: Crackles in the bilateral bases.  No increased work of breathing.  Patient is able to recline at a 10 to 15 degree angle without increased work of breathing.  Speaks in complete, fluent sentences. Abdominal:     General: There is distension.     Palpations: Abdomen is soft. There is no mass.     Tenderness: There is no abdominal tenderness. There is no right CVA tenderness, left CVA tenderness, guarding or rebound.     Hernia: No hernia is present.     Comments: Abdomen is distended, but not tense.  He has a positive fluid wave.  Normoactive bowel sounds.  Negative Murphy sign.  No rebound or guarding.  No CVA tenderness bilaterally.  Normoactive bowel sounds.  Musculoskeletal:     Cervical back: Neck supple.     Right lower leg: Edema present.     Left lower leg: Edema present.     Comments: 1+ pitting edema to the bilateral lower extremities.  Skin:    General: Skin is warm and dry.  Neurological:     Mental Status: He is alert.  Psychiatric:  Behavior: Behavior normal.     ED Results / Procedures / Treatments   Labs (all labs ordered are listed, but only abnormal results are displayed) Labs Reviewed  COMPREHENSIVE METABOLIC PANEL - Abnormal; Notable for the following components:      Result Value   Chloride 93 (*)    Glucose, Bld 100 (*)    BUN 42 (*)    Creatinine, Ser 6.63 (*)    Total Protein 6.1 (*)    Albumin 2.7 (*)    AST 203 (*)    ALT 94 (*)    Alkaline Phosphatase 173 (*)    GFR calc non Af Amer 8 (*)    GFR calc Af Amer 10 (*)    Anion gap 18 (*)    All other components within normal limits  CBC - Abnormal; Notable for the following components:   WBC 12.2 (*)    RBC 3.62 (*)    Hemoglobin 9.9 (*)    HCT 28.1 (*)    MCV 77.6 (*)    RDW 18.6 (*)    nRBC 0.3 (*)    All other  components within normal limits  LIPASE, BLOOD  ACETAMINOPHEN LEVEL    EKG EKG Interpretation  Date/Time:  Saturday October 27 2019 20:53:56 EDT Ventricular Rate:  85 PR Interval:    QRS Duration: 178 QT Interval:  462 QTC Calculation: 549 R Axis:   -88 Text Interpretation: Atrial fibrillation with premature ventricular or aberrantly conducted complexes Left axis deviation Non-specific intra-ventricular conduction block Left ventricular hypertrophy ( Cornell product , Romhilt-Estes ) Abnormal ECG Confirmed by Addison Lank ([id number]) on 10/28/2019 6:24:24 AM   Radiology No results found.  Procedures Procedures (including critical care time)  Medications Ordered in ED Medications  sodium chloride flush (NS) 0.9 % injection 3 mL (has no administration in time range)  lidocaine (LIDODERM) 5 % 1 patch (1 patch Transdermal Patch Applied 10/28/19 0651)    ED Course  I have reviewed the triage vital signs and the nursing notes.  Pertinent labs & imaging results that were available during my care of the patient were reviewed by me and considered in my medical decision making (see chart for details).    MDM Rules/Calculators/A&P                      59 year old male with a history of ESRD on HD, GERD, hemorrhoids, chronic hepatitis B, hepatitis C, COPD, cocaine use disorder who is well-known to this emergency department with 36 visits in the last 6 months.  He was seen yesterday and admitted for accidental Tylenol overdose, but left AMA.  He returns today for abdominal pain.  He endorsed nausea and vomiting to triage staff on arrival, but has been observed for more than 10 hours in the emergency department and has had no episodes of vomiting.  Vital signs are normal.  He does have some mild tachycardia with positional changes, but is not in distress and has no increased work of breathing.  His abdomen is distended, but not tense.  He had a paracentesis on 3/26.  Tylenol level is normal.   He has no electrolyte derangements.  There is a mild increase in his transaminases, but bilirubin is normal.  Alkaline phosphatase is improved.  Hemoglobin is improved from previous.  Labs are otherwise unremarkable.  Doubt SBP, bowel obstruction, mesenteric ischemia, cholecystitis, fulminant liver failure.   His primary complaint is abdominal pain.  He is requesting Dilaudid.  The  patient was discussed with Dr. Jeris Penta, attending physician who recommends avoiding narcotics at this time.  He does not require readmission as Tylenol level has normalized.  He does not require emergent hemodialysis as he has no electrolyte derangements or respiratory distress.  He was given a lidocaine patch for pain control.  Will discharge home with a course of similar.  He was advised to go to dialysis tomorrow.   He is hemodynamically stable in no acute distress.  Safe for discharge home with outpatient follow-up to dialysis.  Final Clinical Impression(s) / ED Diagnoses Final diagnoses:  Generalized abdominal pain    Rx / DC Orders ED Discharge Orders         Ordered    lidocaine (LIDODERM) 5 %  Every 24 hours     10/28/19 0640           Joanne Gavel, PA-C 10/28/19 1031    Fatima Blank, MD 11/01/19 2239

## 2019-10-28 NOTE — ED Triage Notes (Signed)
Pt bib [facility] from home w/ c/o abdominal pain/distention and SOB. Pt hx of COPD, cirrhosis, and kidney disease on dialysis. MWF dialysis, pt did not complete tx on Friday d/t abdominal pain. Pt 100% on RA w/ EMS. Pt denies n/v/d.

## 2019-10-28 NOTE — Discharge Instructions (Addendum)
Thank you for allowing me to care for you today in the Emergency Department.   Tylenol level has improved today.  Do not take more than 4000 mg of Tylenol in a 24-hour period.  You can apply 1 lidocaine patch to areas that are painful once every 24 hours.  Do not apply more than 1 patch at a time.  Please go to dialysis tomorrow.  Please complete the entire session.  Return to the emergency department if you develop respiratory distress, uncontrollable vomiting, high fevers, or other new, concerning symptoms.

## 2019-10-29 ENCOUNTER — Emergency Department (HOSPITAL_COMMUNITY): Payer: Medicare Other

## 2019-10-29 DIAGNOSIS — R188 Other ascites: Secondary | ICD-10-CM | POA: Diagnosis not present

## 2019-10-29 HISTORY — PX: IR PARACENTESIS: IMG2679

## 2019-10-29 LAB — COMPREHENSIVE METABOLIC PANEL
ALT: 99 U/L — ABNORMAL HIGH (ref 0–44)
AST: 184 U/L — ABNORMAL HIGH (ref 15–41)
Albumin: 2.7 g/dL — ABNORMAL LOW (ref 3.5–5.0)
Alkaline Phosphatase: 177 U/L — ABNORMAL HIGH (ref 38–126)
Anion gap: 20 — ABNORMAL HIGH (ref 5–15)
BUN: 55 mg/dL — ABNORMAL HIGH (ref 6–20)
CO2: 19 mmol/L — ABNORMAL LOW (ref 22–32)
Calcium: 9.7 mg/dL (ref 8.9–10.3)
Chloride: 94 mmol/L — ABNORMAL LOW (ref 98–111)
Creatinine, Ser: 7.31 mg/dL — ABNORMAL HIGH (ref 0.61–1.24)
GFR calc Af Amer: 9 mL/min — ABNORMAL LOW (ref >60)
GFR calc non Af Amer: 8 mL/min — ABNORMAL LOW (ref >60)
Glucose, Bld: 89 mg/dL (ref 70–99)
Potassium: 6.1 mmol/L — ABNORMAL HIGH (ref 3.5–5.1)
Sodium: 133 mmol/L — ABNORMAL LOW (ref 135–145)
Total Bilirubin: 0.9 mg/dL (ref 0.3–1.2)
Total Protein: 6.2 g/dL — ABNORMAL LOW (ref 6.5–8.1)

## 2019-10-29 LAB — LIPASE, BLOOD: Lipase: 21 U/L (ref 11–51)

## 2019-10-29 LAB — PATHOLOGIST SMEAR REVIEW

## 2019-10-29 MED ORDER — HYDROMORPHONE HCL 1 MG/ML IJ SOLN
1.0000 mg | Freq: Once | INTRAMUSCULAR | Status: AC
  Administered 2019-10-29: 1 mg via INTRAVENOUS
  Filled 2019-10-29: qty 1

## 2019-10-29 MED ORDER — SODIUM ZIRCONIUM CYCLOSILICATE 10 G PO PACK
10.0000 g | PACK | Freq: Once | ORAL | Status: AC
  Administered 2019-10-29: 10 g via ORAL
  Filled 2019-10-29: qty 1

## 2019-10-29 MED ORDER — LIDOCAINE HCL 1 % IJ SOLN
INTRAMUSCULAR | Status: AC
  Filled 2019-10-29: qty 20

## 2019-10-29 MED ORDER — LIDOCAINE HCL (PF) 1 % IJ SOLN
INTRAMUSCULAR | Status: AC | PRN
  Administered 2019-10-29: 10 mL

## 2019-10-29 MED ORDER — CHLORHEXIDINE GLUCONATE CLOTH 2 % EX PADS: 6.0000 | MEDICATED_PAD | Freq: Every day | CUTANEOUS | Status: DC

## 2019-10-29 NOTE — Progress Notes (Signed)
Renal Navigator received message from Renal NP that patient is here for paracentesis and then will be discharged. Patient's regular chair time at his OP HD clinic [facility]) is 12:30pm and therefore, he needs to go there at discharge from hospital.  Renal Navigator spoke with patient on his cell phone. He reports that he arrived by ambulance and that his car is "off [address] Navigator suggests that hospital CM can provide transportation voucher to his car and then he can drive himself to HD and home after. Patient reluctantly agrees and states no reason why this plan will not work for him. Navigator stressed the importance of attending treatment. Patient stated understanding.  Navigator contacted bedside RN and ED CM to assist with plan. Navigator appreciated assistance from teammates.  Alphonzo Cruise, [place] Renal Navigator [phone]

## 2019-10-29 NOTE — ED Notes (Signed)
Pt transported to IR 

## 2019-10-29 NOTE — ED Notes (Signed)
This RN dressed this pts buttock wound

## 2019-10-29 NOTE — Procedures (Signed)
PROCEDURE SUMMARY:  Successful US guided paracentesis from [facility].  Yielded 2.5 L of clear yellow fluid.  No immediate complications.  Pt tolerated well.   Specimen was not sent for labs.  EBL < 20mL  Ascencion Dike PA-C 10/29/2019 11:30 AM

## 2019-10-29 NOTE — ED Provider Notes (Signed)
  Physical Exam  BP (!) 140/93 (BP Location: Left Arm)   Pulse (!) 118   Resp 18   SpO2 99%   Physical Exam  ED Course/Procedures     Procedures  MDM  Received patient in signout.  Interventional radiology did paracentesis.  States he feels better.  Still has some tachycardia.  Mild hyperkalemia.  Has been given Lokelma.  However has dialysis at noon today and will discharge.  Has been verified that he can still go to the treatment today.       Davonna Belling, MD 10/29/19 1051

## 2019-10-29 NOTE — ED Notes (Signed)
Pt returned from IR.

## 2019-10-29 NOTE — ED Notes (Signed)
This RN attempted twice to insert a PIV; IV Team consulted as both attempts were unsuccessful.

## 2019-10-29 NOTE — Discharge Planning (Signed)
RNCM consulted by Renal Navigator to provide transportation to pt vehicle sop that he may drive himself to and from his scheduled dialysis appointment today.  RNCM met with pt at bedside to obtain address pt will be traveling to.  RNCM provided taxi voucher to bedside RN.

## 2019-10-29 NOTE — Discharge Instructions (Addendum)
Go to dialysis to get your treatment today

## 2019-10-30 ENCOUNTER — Encounter (HOSPITAL_BASED_OUTPATIENT_CLINIC_OR_DEPARTMENT_OTHER): Payer: Medicare Other | Attending: Internal Medicine | Admitting: Internal Medicine

## 2019-10-30 ENCOUNTER — Other Ambulatory Visit: Payer: Self-pay

## 2019-10-30 DIAGNOSIS — B1910 Unspecified viral hepatitis B without hepatic coma: Secondary | ICD-10-CM | POA: Insufficient documentation

## 2019-10-30 DIAGNOSIS — I2510 Atherosclerotic heart disease of native coronary artery without angina pectoris: Secondary | ICD-10-CM | POA: Diagnosis not present

## 2019-10-30 DIAGNOSIS — I480 Paroxysmal atrial fibrillation: Secondary | ICD-10-CM | POA: Diagnosis not present

## 2019-10-30 DIAGNOSIS — Z992 Dependence on renal dialysis: Secondary | ICD-10-CM | POA: Insufficient documentation

## 2019-10-30 DIAGNOSIS — J449 Chronic obstructive pulmonary disease, unspecified: Secondary | ICD-10-CM | POA: Diagnosis not present

## 2019-10-30 DIAGNOSIS — M1990 Unspecified osteoarthritis, unspecified site: Secondary | ICD-10-CM | POA: Insufficient documentation

## 2019-10-30 DIAGNOSIS — F17210 Nicotine dependence, cigarettes, uncomplicated: Secondary | ICD-10-CM | POA: Insufficient documentation

## 2019-10-30 DIAGNOSIS — I120 Hypertensive chronic kidney disease with stage 5 chronic kidney disease or end stage renal disease: Secondary | ICD-10-CM | POA: Insufficient documentation

## 2019-10-30 DIAGNOSIS — L89159 Pressure ulcer of sacral region, unspecified stage: Secondary | ICD-10-CM | POA: Diagnosis present

## 2019-10-30 DIAGNOSIS — K7460 Unspecified cirrhosis of liver: Secondary | ICD-10-CM | POA: Insufficient documentation

## 2019-10-30 DIAGNOSIS — R188 Other ascites: Secondary | ICD-10-CM | POA: Insufficient documentation

## 2019-10-30 DIAGNOSIS — Z602 Problems related to living alone: Secondary | ICD-10-CM | POA: Insufficient documentation

## 2019-10-30 DIAGNOSIS — L89150 Pressure ulcer of sacral region, unstageable: Secondary | ICD-10-CM | POA: Insufficient documentation

## 2019-10-30 DIAGNOSIS — N186 End stage renal disease: Secondary | ICD-10-CM | POA: Diagnosis not present

## 2019-10-30 NOTE — Progress Notes (Signed)
[name], [name] ([id number]) Visit Report for 10/30/2019 Chief Complaint Document Details Patient Name: Date of Service: [name], [name] 10/30/2019 1:15 PM Medical Record [id number] Patient Account Number: [id number] Date of Birth/Sex: Treating RN: 1960-10-28 (59 y.o. Jerilynn Mages) Carlene Coria Primary Care Provider: Dustin Folks Other Clinician: Referring Provider: Treating Provider/Extender:Delise Simenson, [name], FRED Weeks in Treatment: 0 Information Obtained from: Patient Chief Complaint 10/30/2019; patient is here for review of a pressure ulcer on his lower sacrum Electronic Signature(s) Signed: 10/30/2019 5:47:38 PM By: Linton Ham MD Entered By: Linton Ham on 10/30/2019 15:04:27 -------------------------------------------------------------------------------- Debridement Details Patient Name: Date of Service: [name] 10/30/2019 1:15 PM Medical Record [id number] Patient Account Number: [id number] Date of Birth/Sex: Treating RN: [birth year] (59 y.o. Jerilynn Mages) Carlene Coria Primary Care Provider: Dustin Folks Other Clinician: Referring Provider: Treating Provider/Extender:Alitzel Cookson, [name], FRED Weeks in Treatment: 0 Debridement Performed for Wound #1 Sacrum Assessment: Performed By: Physician Ricard Dillon., MD Debridement Type: Debridement Level of Consciousness (Pre- Awake and Alert procedure): Pre-procedure Verification/Time Out Taken: Yes - 14:40 Start Time: 14:40 Pain Control: Lidocaine 5% topical ointment Total Area Debrided (L x W): 3.7 (cm) x 2.4 (cm) = 8.88 (cm) Tissue and other material Viable, Non-Viable, Slough, Subcutaneous, Skin: Dermis , Skin: Epidermis, Slough debrided: Level: Skin/Subcutaneous Tissue Debridement Description: Excisional Instrument: Curette Bleeding: Moderate Hemostasis Achieved: Pressure End Time: 14:44 Procedural Pain: 5 Post Procedural Pain: 2 Response to Treatment: Procedure was tolerated well Level of  Consciousness Awake and Alert (Post-procedure): Post Debridement Measurements of Total Wound Length: (cm) 3.7 Stage: Unstageable/Unclassified Width: (cm) 2.4 Depth: (cm) 0.1 Volume: (cm) 0.697 Character of Wound/Ulcer Post Improved Debridement: Post Procedure Diagnosis Same as Pre-procedure Electronic Signature(s) Signed: 10/30/2019 5:39:56 PM By: Carlene Coria RN Signed: 10/30/2019 5:47:38 PM By: Linton Ham MD Entered By: Linton Ham on 10/30/2019 15:04:09 -------------------------------------------------------------------------------- HPI Details Patient Name: Date of Service: [name] 10/30/2019 1:15 PM Medical Record [id number] Patient Account Number: [id number] Date of Birth/Sex: Treating RN: [birth year] (59 y.o. Oval Linsey Primary Care Provider: Dustin Folks Other Clinician: Referring Provider: Treating Provider/Extender:Jazzy Parmer, [name], FRED Weeks in Treatment: 0 History of Present Illness HPI Description: ADMISSION 10/30/2019 This is a 59 year old man with multiple medical problems predominantly chronic renal failure on dialysis and cirrhosis with ascites secondary to hepatitis B C or potentially alcohol abuse. He has an unstageable wound on his sacrum. I think this was noted during one of his innumerable trips to the ER and he was sent here. He has been using Neosporin. He is completely unable to tell us any time frame for when this started. He says he thinks this was in the hospital but again is unsure. He has no pictures on his phone. He lives alone and does not really have a lot of help with topical dressings. Past medical history includes chronic renal failure on dialysis, exact etiology of his renal failure is not really clear. He has hepatitis B C paroxysmal atrial fibrillation cirrhosis with recurrent ascites requiring multiple paracenteses, substance abuse, left arm shunt infection problems, COPD, hypertension and coronary artery  disease. Electronic Signature(s) Signed: 10/30/2019 5:47:38 PM By: Linton Ham MD Entered By: Linton Ham on 10/30/2019 15:06:03 -------------------------------------------------------------------------------- Physical Exam Details Patient Name: Date of Service: [name], [name] 10/30/2019 1:15 PM Medical Record [id number] Patient Account Number: [id number] Date of Birth/Sex: Treating RN: Nov 17, 1960 (59 y.o. Oval Linsey Primary Care Provider: Dustin Folks Other Clinician: Referring Provider: Treating Provider/Extender:Jazlin Tapscott, [name], FRED Weeks in Treatment: 0 Constitutional Patient is hypertensive.. Pulse regular  and within target range for patient.Marland Kitchen Respirations regular, non-labored and within target range.. Temperature is normal and within the target range for the patient.Marland Kitchen Appears in no distress. Gastrointestinal (GI) Abdomen is distended but nontender.. Hepatomegaly. Integumentary (Hair, Skin) Is no erythema around the wound. Psychiatric Patient appears depressed today.. Notes Wound exam; the areas on the lower sacrum approximately S3-S4. Completely nonviable unstageable surface very adherent white necrotic material. With some difficulty I got most of this debrided with a #5 curette he did not tolerate this very well. We had significant trouble with hemostasis requiring silver nitrate, Gelfoam and a pressure dressing. Electronic Signature(s) Signed: 10/30/2019 5:47:38 PM By: Linton Ham MD Entered By: Linton Ham on 10/30/2019 15:07:28 -------------------------------------------------------------------------------- Physician Orders Details Patient Name: Date of Service: [name] 10/30/2019 1:15 PM Medical Record [id number] Patient Account Number: [id number] Date of Birth/Sex: Treating RN: 07/29/1960 (59 y.o. Oval Linsey Primary Care Provider: Dustin Folks Other Clinician: Referring Provider: Treating  Provider/Extender:Mayleigh Tetrault, [name], FRED Weeks in Treatment: 0 Verbal / Phone Orders: No Diagnosis Coding Follow-up Appointments Return Appointment in 1 week. Dressing Change Frequency Change dressing every day. Wound Cleansing May shower and wash wound with soap and water. Primary Wound Dressing Medihoney gel Secondary Dressing Dry Gauze - secure with tape Electronic Signature(s) Signed: 10/30/2019 5:39:56 PM By: Carlene Coria RN Signed: 10/30/2019 5:47:38 PM By: Linton Ham MD Entered By: Carlene Coria on 10/30/2019 14:43:37 -------------------------------------------------------------------------------- Problem List Details Patient Name: Date of Service: [name] 10/30/2019 1:15 PM Medical Record [id number] Patient Account Number: [id number] Date of Birth/Sex: Treating RN: 05/04/1961 (59 y.o. Jerilynn Mages) Carlene Coria Primary Care Provider: Dustin Folks Other Clinician: Referring Provider: Treating Provider/Extender:Aria Jarrard, [name], FRED Weeks in Treatment: 0 Active Problems ICD-10 Evaluated Encounter Code Description Active Date Today Diagnosis L89.150 Pressure ulcer of sacral region, unstageable 10/30/2019 No Yes K74.69 Other cirrhosis of liver 10/30/2019 No Yes Inactive Problems Resolved Problems Electronic Signature(s) Signed: 10/30/2019 5:47:38 PM By: Linton Ham MD Entered By: Linton Ham on 10/30/2019 14:50:18 -------------------------------------------------------------------------------- Progress Note Details Patient Name: Date of Service: [name] 10/30/2019 1:15 PM Medical Record [id number] Patient Account Number: [id number] Date of Birth/Sex: Treating RN: September 16, 1960 (59 y.o. Oval Linsey Primary Care Provider: Dustin Folks Other Clinician: Referring Provider: Treating Provider/Extender:Neita Landrigan, [name], FRED Weeks in Treatment: 0 Subjective Chief Complaint Information obtained from Patient 10/30/2019;  patient is here for review of a pressure ulcer on his lower sacrum History of Present Illness (HPI) ADMISSION 10/30/2019 This is a 59 year old man with multiple medical problems predominantly chronic renal failure on dialysis and cirrhosis with ascites secondary to hepatitis B C or potentially alcohol abuse. He has an unstageable wound on his sacrum. I think this was noted during one of his innumerable trips to the ER and he was sent here. He has been using Neosporin. He is completely unable to tell us any time frame for when this started. He says he thinks this was in the hospital but again is unsure. He has no pictures on his phone. He lives alone and does not really have a lot of help with topical dressings. Past medical history includes chronic renal failure on dialysis, exact etiology of his renal failure is not really clear. He has hepatitis B C paroxysmal atrial fibrillation cirrhosis with recurrent ascites requiring multiple paracenteses, substance abuse, left arm shunt infection problems, COPD, hypertension and coronary artery disease. Patient History Information obtained from Patient. Allergies tramadol (Severity: Moderate, Reaction: itching), morphine (Severity: Moderate, Reaction: itching), pollen (Severity: Moderate,  Reaction: sneezing, rhinitis, eyes itching), acetaminophen (Severity: Moderate, Reaction: itching), aspirin (Severity: Moderate, Reaction: itching), clonidine (Severity: Moderate, Reaction: itching) Family History Cancer - Mother,Siblings, Heart Disease - Mother,Father, Hypertension, Lung Disease - Father, No family history of Diabetes, Hereditary Spherocytosis, Kidney Disease, Seizures, Stroke, Thyroid Problems, Tuberculosis. Social History Current every day smoker - 1 ppd, Marital Status - Single, Alcohol Use - Never - quit 1 yr ago, Drug Use - Prior History - cocaine, [place], Caffeine Use - Rarely. Medical History Eyes Denies history of Cataracts, Glaucoma, Optic  Neuritis Ear/Nose/Mouth/Throat Denies history of Chronic sinus problems/congestion, Middle ear problems Hematologic/Lymphatic Patient has history of Anemia Denies history of Hemophilia, Human Immunodeficiency Virus, Lymphedema, Sickle Cell Disease Respiratory Patient has history of Chronic Obstructive Pulmonary Disease (COPD) Denies history of Aspiration, Asthma, Pneumothorax, Sleep Apnea, Tuberculosis Cardiovascular Patient has history of Arrhythmia - afib, Coronary Artery Disease, Hypertension, Myocardial Infarction Gastrointestinal Patient has history of Cirrhosis , Hepatitis B, Hepatitis C Denies history of Hepatitis A Endocrine Denies history of Type I Diabetes, Type II Diabetes Genitourinary Patient has history of End Stage Renal Disease - HD Integumentary (Skin) Denies history of History of Burn Musculoskeletal Patient has history of Osteoarthritis Denies history of Gout, Rheumatoid Arthritis, Osteomyelitis Neurologic Denies history of Dementia, Neuropathy, Quadriplegia, Paraplegia, Seizure Disorder Psychiatric Patient has history of Confinement Anxiety Denies history of Anorexia/bulimia Hospitalization/Surgery History - A/V fistula placement,. - cardiac cath with stent. - hemorrhoid banding. - IandD of left arm hematoma. - multiple paracentesis. - polypectomy. - revision of AV fistula. - skin graft left arm. - left arm thrombectomy. Medical And Surgical History Notes Cardiovascular pulmonary edema, mitral stenosis Review of Systems (ROS) Constitutional Symptoms (General Health) Complains or has symptoms of Fatigue - continued fatigue, Marked Weight Change. Denies complaints or symptoms of Fever, Chills, 25 lbs Eyes Complains or has symptoms of Glasses / Contacts. Denies complaints or symptoms of Dry Eyes, Vision Changes. Ear/Nose/Mouth/Throat Complains or has symptoms of Chronic sinus problems or rhinitis, seasonal eye itching Respiratory Complains or has symptoms  of Shortness of Breath. Cardiovascular Denies complaints or symptoms of Chest pain. Endocrine Denies complaints or symptoms of Heat/cold intolerance. Integumentary (Skin) Complains or has symptoms of Wounds - sacrum. Musculoskeletal Complains or has symptoms of Muscle Weakness. Neurologic Denies complaints or symptoms of Numbness/parasthesias. Psychiatric Complains or has symptoms of Claustrophobia. Denies complaints or symptoms of Suicidal. Objective Constitutional Patient is hypertensive.. Pulse regular and within target range for patient.Marland Kitchen Respirations regular, non-labored and within target range.. Temperature is normal and within the target range for the patient.Marland Kitchen Appears in no distress. Vitals Time Taken: 1:45 AM, Height: 69 in, Source: Stated, Weight: 142 lbs, Source: Stated, BMI: 21, Temperature: 98.1 F, Pulse: 69 bpm, Respiratory Rate: 18 breaths/min, Blood Pressure: 144/55 mmHg. Gastrointestinal (GI) Abdomen is distended but nontender.. Hepatomegaly. Psychiatric Patient appears depressed today.. General Notes: Wound exam; the areas on the lower sacrum approximately S3-S4. Completely nonviable unstageable surface very adherent white necrotic material. With some difficulty I got most of this debrided with a #5 curette he did not tolerate this very well. We had significant trouble with hemostasis requiring silver nitrate, Gelfoam and a pressure dressing. Integumentary (Hair, Skin) Is no erythema around the wound. Wound #1 status is Open. Original cause of wound was Pressure Injury. The wound is located on the Sacrum. The wound measures 3.7cm length x 2.4cm width x 0.1cm depth; 6.974cm^2 area and 0.697cm^3 volume. There is Fat Layer (Subcutaneous Tissue) Exposed exposed. There is no tunneling or undermining noted. There  is a medium amount of serosanguineous drainage noted. The wound margin is flat and intact. There is no granulation within the wound bed. There is a large  (67-100%) amount of necrotic tissue within the wound bed including Adherent Slough. Assessment Active Problems ICD-10 Pressure ulcer of sacral region, unstageable Other cirrhosis of liver Procedures Wound #1 Pre-procedure diagnosis of Wound #1 is a Pressure Ulcer located on the Sacrum . There was a Excisional Skin/Subcutaneous Tissue Debridement with a total area of 8.88 sq cm performed by Ricard Dillon., MD. With the following instrument(s): Curette to remove Viable and Non-Viable tissue/material. Material removed includes Subcutaneous Tissue, Slough, Skin: Dermis, and Skin: Epidermis after achieving pain control using Lidocaine 5% topical ointment. No specimens were taken. A time out was conducted at 14:40, prior to the start of the procedure. A Moderate amount of bleeding was controlled with Pressure. The procedure was tolerated well with a pain level of 5 throughout and a pain level of 2 following the procedure. Post Debridement Measurements: 3.7cm length x 2.4cm width x 0.1cm depth; 0.697cm^3 volume. Post debridement Stage noted as Unstageable/Unclassified. Character of Wound/Ulcer Post Debridement is improved. Post procedure Diagnosis Wound #1: Same as Pre-Procedure Plan Follow-up Appointments: Return Appointment in 1 week. Dressing Change Frequency: Change dressing every day. Wound Cleansing: May shower and wash wound with soap and water. Primary Wound Dressing: Medihoney gel Secondary Dressing: Dry Gauze - secure with tape 1. We had trouble with coming up without appropriate dressing. He lives alone does not have help with dressing changes. After some consideration elected to use Medihoney with gauze and tape change daily. He may be able to do this himself. 2. Consideration of pressure relief although he sits in a dialysis chair 3 times a week. I made him aware of the importance of this. 3. Not sure of his nutritional status. Thin somewhat chronically ill man. 4. No  evidence of infection. I spent 30 minutes on direct review of this patient's record face-to-face evaluation and creation of this record. Electronic Signature(s) Signed: 10/30/2019 5:47:38 PM By: Linton Ham MD Entered By: Linton Ham on 10/30/2019 15:09:49 -------------------------------------------------------------------------------- HxROS Details Patient Name: Date of Service: [name] 10/30/2019 1:15 PM Medical Record [id number] Patient Account Number: [id number] Date of Birth/Sex: Treating RN: 1960/10/12 (59 y.o. [name] Primary Care Provider: Dustin Folks Other Clinician: Referring Provider: Treating Provider/Extender:Melenda Bielak, [name], FRED Weeks in Treatment: 0 Information Obtained From Patient Constitutional Symptoms (General Health) Complaints and Symptoms: Positive for: Fatigue - continued fatigue; Marked Weight Change Negative for: Fever; Chills Review of System Notes: 25 lbs Eyes Complaints and Symptoms: Positive for: Glasses / Contacts Negative for: Dry Eyes; Vision Changes Medical History: Negative for: Cataracts; Glaucoma; Optic Neuritis Ear/Nose/Mouth/Throat Complaints and Symptoms: Positive for: Chronic sinus problems or rhinitis Review of System Notes: seasonal eye itching Medical History: Negative for: Chronic sinus problems/congestion; Middle ear problems Respiratory Complaints and Symptoms: Positive for: Shortness of Breath Medical History: Positive for: Chronic Obstructive Pulmonary Disease (COPD) Negative for: Aspiration; Asthma; Pneumothorax; Sleep Apnea; Tuberculosis Cardiovascular Complaints and Symptoms: Negative for: Chest pain Medical History: Positive for: Arrhythmia - afib; Coronary Artery Disease; Hypertension; Myocardial Infarction Past Medical History Notes: pulmonary edema, mitral stenosis Endocrine Complaints and Symptoms: Negative for: Heat/cold intolerance Medical History: Negative for:  Type I Diabetes; Type II Diabetes Integumentary (Skin) Complaints and Symptoms: Positive for: Wounds - sacrum Medical History: Negative for: History of Burn Musculoskeletal Complaints and Symptoms: Positive for: Muscle Weakness Medical History: Positive for: Osteoarthritis Negative for:  Gout; Rheumatoid Arthritis; Osteomyelitis Neurologic Complaints and Symptoms: Negative for: Numbness/parasthesias Medical History: Negative for: Dementia; Neuropathy; Quadriplegia; Paraplegia; Seizure Disorder Psychiatric Complaints and Symptoms: Positive for: Claustrophobia Negative for: Suicidal Medical History: Positive for: Confinement Anxiety Negative for: Anorexia/bulimia Hematologic/Lymphatic Medical History: Positive for: Anemia Negative for: Hemophilia; Human Immunodeficiency Virus; Lymphedema; Sickle Cell Disease Gastrointestinal Medical History: Positive for: Cirrhosis ; Hepatitis B; Hepatitis C Negative for: Hepatitis A Genitourinary Medical History: Positive for: End Stage Renal Disease - HD Immunological Oncologic Immunizations Pneumococcal Vaccine: Received Pneumococcal Vaccination: Yes Implantable Devices Yes Hospitalization / Surgery History Type of Hospitalization/Surgery A/V fistula placement, cardiac cath with stent hemorrhoid banding IandD of left arm hematoma multiple paracentesis polypectomy revision of AV fistula skin graft left arm left arm thrombectomy Family and Social History Cancer: Yes - Mother,Siblings; Diabetes: No; Heart Disease: Yes - Mother,Father; Hereditary Spherocytosis: No; Hypertension: Yes; Kidney Disease: No; Lung Disease: Yes - Father; Seizures: No; Stroke: No; Thyroid Problems: No; Tuberculosis: No; Current every day smoker - 1 ppd; Marital Status - Single; Alcohol Use: Never - quit 1 yr ago; Drug Use: Prior History - cocaine, [place]; Caffeine Use: Rarely; Financial Concerns: No; Food, Clothing or Shelter Needs: No; Support System  Lacking: No; Transportation Concerns: No Electronic Signature(s) Signed: 10/30/2019 5:47:38 PM By: Linton Ham MD Signed: 10/30/2019 6:13:22 PM By: Baruch Gouty RN, BSN Entered By: Baruch Gouty on 10/30/2019 14:09:03 -------------------------------------------------------------------------------- [place] Details Patient Name: Date of Service: [name] 10/30/2019 Medical Record [id number] Patient Account Number: [id number] Date of Birth/Sex: Treating RN: [birth year] (59 y.o. Oval Linsey Primary Care Provider: Dustin Folks Other Clinician: Referring Provider: Treating Provider/Extender:[name], [name], FRED Weeks in Treatment: 0 Diagnosis Coding ICD-10 Codes Code Description L89.150 Pressure ulcer of sacral region, unstageable K74.69 Other cirrhosis of liver Facility Procedures CPT4 Code: 97673419 Description: [facility] VISIT-LEV 3 EST PT Modifier: 25 Quantity: 1 CPT4 Code: [id number] Description: 73532 - DEB SUBQ TISSUE 20 SQ CM/< ICD-10 Diagnosis Description L89.150 Pressure ulcer of sacral region, unstageable Modifier: Quantity: 1 Physician Procedures CPT4 Code: [id number] Description: WC PHYS LEVEL 3 NEW PT ICD-10 Diagnosis Description L89.150 Pressure ulcer of sacral region, unstageable K74.69 Other cirrhosis of liver Modifier: 25 Quantity: 1 CPT4 Code: [id number] Description: 29798 - WC PHYS SUBQ TISS 20 SQ CM ICD-10 Diagnosis Description L89.150 Pressure ulcer of sacral region, unstageable Modifier: Quantity: 1 Electronic Signature(s) Signed: 10/30/2019 5:39:56 PM By: Carlene Coria RN Signed: 10/30/2019 5:47:38 PM By: Linton Ham MD Entered By: Carlene Coria on 10/30/2019 16:21:34

## 2019-10-30 NOTE — Progress Notes (Signed)
[name] ([id number]) Visit Report for 10/30/2019 Allergy List Details Patient Name: Date of Service: [name] 10/30/2019 1:15 PM Medical Record [id number] Patient Account Number: [id number] Date of Birth/Sex: Treating RN: [birth year] (59 y.o. [name] Primary Care Dianelly Ferran: Dustin Folks Other Clinician: Referring Morrill Bomkamp: Treating Mekayla Soman/Extender:Robson, [name], FRED Weeks in Treatment: 0 Allergies Active Allergies tramadol Reaction: itching Severity: Moderate morphine Reaction: itching Severity: Moderate pollen Reaction: sneezing, rhinitis, eyes itching Severity: Moderate acetaminophen Reaction: itching Severity: Moderate aspirin Reaction: itching Severity: Moderate clonidine Reaction: itching Severity: Moderate Allergy Notes Electronic Signature(s) Signed: 10/30/2019 6:13:22 PM By: Baruch Gouty RN, BSN Entered By: Baruch Gouty on 10/30/2019 13:51:32 -------------------------------------------------------------------------------- Arrival Information Details Patient Name: Date of Service: [name] 10/30/2019 1:15 PM Medical Record [id number] Patient Account Number: [id number] Date of Birth/Sex: Treating RN: 12/12/1960 (59 y.o. [name] Primary Care Teegan Guinther: Dustin Folks Other Clinician: Referring Kelven Flater: Treating Adalae Baysinger/Extender:Robson, [name], FRED Weeks in Treatment: 0 Visit Information Patient Arrived: Wheel Chair Arrival Time: 13:40 Accompanied By: self Transfer Assistance: None Patient Identification Verified: Yes Secondary Verification Process Completed: Yes Patient Requires Transmission-Based No Precautions: Patient Has Alerts: No Electronic Signature(s) Signed: 10/30/2019 6:13:22 PM By: Baruch Gouty RN, BSN Entered By: Baruch Gouty on 10/30/2019 13:46:29 -------------------------------------------------------------------------------- Clinic Level of Care  Assessment Details Patient Name: Date of Service: [name] 10/30/2019 1:15 PM Medical Record [id number] Patient Account Number: [id number] Date of Birth/Sex: Treating RN: 1961/04/02 (59 y.o. Jerilynn Mages) Carlene Coria Primary Care Johne Buckle: Dustin Folks Other Clinician: Referring Carma Dwiggins: Treating Paco Cislo/Extender:Robson, [name], FRED Weeks in Treatment: 0 Clinic Level of Care Assessment Items TOOL 1 Quantity Score X - Use when EandM and Procedure is performed on INITIAL visit 1 0 ASSESSMENTS - Nursing Assessment / Reassessment X - General Physical Exam (combine w/ comprehensive assessment (listed just below) 1 20 when performed on new pt. evals) X - Comprehensive Assessment (HX, ROS, Risk Assessments, Wounds Hx, etc.) 1 25 ASSESSMENTS - Wound and Skin Assessment / Reassessment []  - Dermatologic / Skin Assessment (not related to wound area) 0 ASSESSMENTS - Ostomy and/or Continence Assessment and Care []  - Incontinence Assessment and Management 0 []  - Ostomy Care Assessment and Management (repouching, etc.) 0 PROCESS - Coordination of Care X - Simple Patient / Family Education for ongoing care 1 15 []  - Complex (extensive) Patient / Family Education for ongoing care 0 X - Staff obtains Consents, Records, Test Results / Process Orders 1 10 []  - Staff telephones HHA, Nursing Homes / Clarify orders / etc 0 []  - Routine Transfer to another Facility (non-emergent condition) 0 []  - Routine Hospital Admission (non-emergent condition) 0 X - New Admissions / Biomedical engineer / Ordering NPWT, Apligraf, etc. 1 15 []  - Emergency Hospital Admission (emergent condition) 0 PROCESS - Special Needs []  - Pediatric / Minor Patient Management 0 []  - Isolation Patient Management 0 []  - Hearing / Language / Visual special needs 0 []  - Assessment of Community assistance (transportation, D/C planning, etc.) 0 []  - Additional assistance / Altered mentation 0 []  - Support Surface(s)  Assessment (bed, cushion, seat, etc.) 0 INTERVENTIONS - Miscellaneous []  - External ear exam 0 []  - Patient Transfer (multiple staff / Civil Service fast streamer / Similar devices) 0 []  - Simple Staple / Suture removal (25 or less) 0 []  - Complex Staple / Suture removal (26 or more) 0 []  - Hypo/Hyperglycemic Management (do not check if billed separately) 0 X - Ankle / Brachial Index (ABI) - do not check if billed  separately 1 15 Has the patient been seen at the hospital within the last three years: Yes Total Score: 100 Level Of Care: New/Established - Level 3 Electronic Signature(s) Signed: 10/30/2019 5:39:56 PM By: Carlene Coria RN Entered By: Carlene Coria on 10/30/2019 14:46:17 -------------------------------------------------------------------------------- Encounter Discharge Information Details Patient Name: Date of Service: [name] 10/30/2019 1:15 PM Medical Record [id number] Patient Account Number: [id number] Date of Birth/Sex: Treating RN: June 05, 1961 (59 y.o. Marvis Repress Primary Care Gia Lusher: Dustin Folks Other Clinician: Referring Brock Mokry: Treating Fronie Holstein/Extender:Robson, [name], FRED Weeks in Treatment: 0 Encounter Discharge Information Items Post Procedure Vitals Discharge Condition: Stable Temperature (F): 98.1 Ambulatory Status: Wheelchair Pulse (bpm): 69 Discharge Destination: Home Respiratory Rate (breaths/min): 18 Transportation: Other Blood Pressure (mmHg): 144/55 Accompanied By: self Schedule Follow-up Appointment: Yes Clinical Summary of Care: Patient Declined Electronic Signature(s) Signed: 10/30/2019 5:57:12 PM By: Kela Millin Entered By: Kela Millin on 10/30/2019 16:42:29 -------------------------------------------------------------------------------- Lower Extremity Assessment Details Patient Name: Date of Service: [name] 10/30/2019 1:15 PM Medical Record [id number] Patient Account Number: [id number] Date  of Birth/Sex: Treating RN: [birth year] (59 y.o. [name] Primary Care [place]: Dustin Folks Other Clinician: Referring Othel Dicostanzo: Treating Jessey Huyett/Extender:Robson, [name], FRED Weeks in Treatment: 0 Electronic Signature(s) Signed: 10/30/2019 6:13:22 PM By: Baruch Gouty RN, BSN Entered By: Baruch Gouty on 10/30/2019 14:15:50 -------------------------------------------------------------------------------- Multi Wound Chart Details Patient Name: Date of Service: [name] 10/30/2019 1:15 PM Medical Record [id number] Patient Account Number: [id number] Date of Birth/Sex: Treating RN: 12/08/1960 (59 y.o. Jerilynn Mages) Carlene Coria Primary Care Vaanya Shambaugh: Dustin Folks Other Clinician: Referring Keyry Iracheta: Treating Kala Ambriz/Extender:Robson, [name], FRED Weeks in Treatment: 0 Vital Signs Height(in): 58 Pulse(bpm): 25 Weight(lbs): 142 Blood Pressure(mmHg): 144/55 Body Mass Index(BMI): 21 Temperature(F): 98.1 Respiratory 18 Rate(breaths/min): Photos: [1:No Photos] [N/A:N/A] Wound Location: [1:Sacrum] [N/A:N/A] Wounding Event: [1:Pressure Injury] [N/A:N/A] Primary Etiology: [1:Pressure Ulcer] [N/A:N/A] Comorbid History: [1:Anemia, Chronic Obstructive Pulmonary Disease (COPD), Arrhythmia, Coronary Artery Disease, Hypertension, Myocardial Infarction, Cirrhosis , Hepatitis B, Hepatitis C, End Stage Renal Disease, Osteoarthritis, Confinement Anxiety]  [N/A:N/A] Date Acquired: [1:09/10/2019] [N/A:N/A] Weeks of Treatment: [1:0] [N/A:N/A] Wound Status: [1:Open] [N/A:N/A] Measurements L x W x D 3.7x2.4x0.1 [N/A:N/A] (cm) Area (cm) : [1:6.974] [N/A:N/A] Volume (cm) : [1:0.697] [N/A:N/A] Classification: [1:Unstageable/Unclassified N/A] Exudate Amount: [1:Medium] [N/A:N/A] Exudate Type: [1:Serosanguineous] [N/A:N/A] Exudate Color: [1:red, brown] [N/A:N/A] Wound Margin: [1:Flat and Intact] [N/A:N/A] Granulation Amount: [1:None Present (0%)] [N/A:N/A] Necrotic  Amount: [1:Large (67-100%)] [N/A:N/A] Exposed Structures: [1:Fat Layer (Subcutaneous N/A Tissue) Exposed: Yes Fascia: No Tendon: No Muscle: No Joint: No Bone: No Small (1-33%)] [N/A:N/A] Treatment Notes Electronic Signature(s) Signed: 10/30/2019 5:39:56 PM By: Carlene Coria RN Signed: 10/30/2019 5:47:38 PM By: Linton Ham MD Entered By: Linton Ham on 10/30/2019 14:50:31 -------------------------------------------------------------------------------- Multi-Disciplinary Care Plan Details Patient Name: Date of Service: [name] 10/30/2019 1:15 PM Medical Record [id number] Patient Account Number: [id number] Date of Birth/Sex: Treating RN: Nov 04, 1960 (59 y.o. Oval Linsey Primary Care Yovanny Coats: Dustin Folks Other Clinician: Referring Alamin Mccuiston: Treating Randell Teare/Extender:Robson, [name], FRED Weeks in Treatment: 0 Active Inactive Wound/Skin Impairment Nursing Diagnoses: Knowledge deficit related to ulceration/compromised skin integrity Goals: Patient/caregiver will verbalize understanding of skin care regimen Date Initiated: 10/30/2019 Target Resolution Date: 11/29/2019 Goal Status: Active Ulcer/skin breakdown will have a volume reduction of 30% by week 4 Date Initiated: 10/30/2019 Target Resolution Date: 11/29/2019 Goal Status: Active Interventions: Assess patient/caregiver ability to obtain necessary supplies Assess patient/caregiver ability to perform ulcer/skin care regimen upon admission and as needed Assess ulceration(s) every visit Notes: Electronic Signature(s) Signed: 10/30/2019 5:39:56  PM By: Carlene Coria RN Entered By: Carlene Coria on 10/30/2019 14:39:38 -------------------------------------------------------------------------------- Pain Assessment Details Patient Name: Date of Service: QUINTUS, PREMO 10/30/2019 1:15 PM Medical Record [id number] Patient Account Number: [id number] Date of Birth/Sex: Treating RN: September 02, 1960 (59 y.o. [name] Primary Care Naomee Nowland: Dustin Folks Other Clinician: Referring Ranisha Allaire: Treating Wang [place]/Extender:Robson, [name], FRED Weeks in Treatment: 0 Active Problems Location of Pain Severity and Description of Pain Patient Has Paino Yes Site Locations Pain Location: Generalized Pain, Pain in Ulcers With Dressing Change: No Duration of the Pain. Constant / Intermittento Intermittent Rate the pain. Current Pain Level: 5 Worst Pain Level: 10 Least Pain Level: 0 Character of Pain Describe the Pain: Sharp Pain Management and Medication Current Pain Management: Medication: Yes Is the Current Pain Management Adequate: Adequate Rest: Yes How does your wound impact your activities of daily livingo Sleep: No Bathing: No Appetite: No Relationship With Others: No Bladder Continence: No Emotions: No Bowel Continence: No Work: No Toileting: No Drive: No Dressing: No Hobbies: No Electronic Signature(s) Signed: 10/30/2019 6:13:22 PM By: Baruch Gouty RN, BSN Entered By: Baruch Gouty on 10/30/2019 14:21:44 -------------------------------------------------------------------------------- Patient/Caregiver Education Details Patient Name: Date of Service: [name] 4/20/2021andnbsp1:15 PM Medical Record [id number] Patient Account Number: [id number] Date of Birth/Gender: Treating RN: [birth year] (59 y.o. Oval Linsey Primary Care Physician: Dustin Folks Other Clinician: Referring Physician: Treating Physician/Extender:Robson, [name], FRED Weeks in Treatment: 0 Education Assessment Education Provided To: Patient Education Topics Provided Wound/Skin Impairment: Methods: Explain/Verbal Responses: State content correctly Electronic Signature(s) Signed: 10/30/2019 5:39:56 PM By: Carlene Coria RN Entered By: Carlene Coria on 10/30/2019 14:41:11 -------------------------------------------------------------------------------- Wound Assessment  Details Patient Name: Date of Service: ADREN, DOLLINS 10/30/2019 1:15 PM Medical Record [id number] Patient Account Number: [id number] Date of Birth/Sex: Treating RN: May 11, 1961 (58 y.o. [name] Primary Care Melady Chow: Dustin Folks Other Clinician: Referring Sarvesh Meddaugh: Treating Bronsen Serano/Extender:Robson, [name], FRED Weeks in Treatment: 0 Wound Status Wound Number: 1 Primary Pressure Ulcer Etiology: Wound Location: Sacrum Wound Open Wounding Event: Pressure Injury Status: Date Acquired: 09/10/2019 Comorbid Anemia, Chronic Obstructive Pulmonary Weeks Of Treatment: 0 History: Disease (COPD), Arrhythmia, Coronary Artery Clustered Wound: No Disease, Hypertension, Myocardial Infarction, Cirrhosis , Hepatitis B, Hepatitis C, End Stage Renal Disease, Osteoarthritis, Confinement Anxiety Wound Measurements Length: (cm) 3.7 Width: (cm) 2.4 Depth: (cm) 0.1 Area: (cm) 6.974 Volume: (cm) 0.697 Wound Description Classification: Unstageable/Unclassified Wound Margin: Flat and Intact Exudate Amount: Medium Exudate Type: Serosanguineous Exudate Color: red, brown Wound Bed Granulation Amount: None Present (0%) Necrotic Amount: Large (67-100%) Necrotic Quality: Adherent Slough r After Cleansing: No ibrino Yes Exposed Structure posed: No (Subcutaneous Tissue) Exposed: Yes posed: No posed: No osed: No Exposed: No % Reduction in Area: % Reduction in Volume: Epithelialization: Small (1-33%) Tunneling: No Undermining: No Foul Odo Slough/F Fascia Ex Fat Layer Tendon Ex Muscle Ex Joint Exp Bone Treatment Notes Wound #1 (Sacrum) 1. Cleanse With Wound Cleanser 2. Periwound Care Skin Prep 3. Primary Dressing Applied Calcium Alginate 4. Secondary Dressing Dry Gauze Foam Border Dressing Notes due to bleeding, alginate applied to site after pressure was applied. MD notified and aware of treatment. Patient will be applying Medihoney at  home Electronic Signature(s) Signed: 10/30/2019 6:13:22 PM By: Baruch Gouty RN, BSN Entered By: Baruch Gouty on 10/30/2019 14:19:05 -------------------------------------------------------------------------------- [place] Details Patient Name: Date of Service: [name] 10/30/2019 1:15 PM Medical Record [id number] Patient Account Number: [id number] Date of Birth/Sex: Treating RN: 12-05-1960 (59 y.o. [name] Primary Care Tiwanda Threats: Tamala Julian, [place]  Other Clinician: Referring Wylene Weissman: Treating Tziporah Knoke/Extender:Robson, [name], FRED Weeks in Treatment: 0 Vital Signs Time Taken: 01:45 Temperature (F): 98.1 Height (in): 69 Pulse (bpm): 69 Source: Stated Respiratory Rate (breaths/min): 18 Weight (lbs): 142 Blood Pressure (mmHg): 144/55 Source: Stated Reference Range: 80 - 120 mg / dl Body Mass Index (BMI): 21 Electronic Signature(s) Signed: 10/30/2019 6:13:22 PM By: Baruch Gouty RN, BSN Entered By: Baruch Gouty on 10/30/2019 13:48:06

## 2019-10-30 NOTE — Progress Notes (Signed)
[name], [name] ([id number]) Visit Report for 10/30/2019 Abuse/Suicide Risk Screen Details Patient Name: Date of Service: [name], [name] 10/30/2019 1:15 PM Medical Record [id number] Patient Account Number: [id number] Date of Birth/Sex: Treating RN: [birth year] (59 y.o. [name] Primary Care [name]: Dustin Folks Other Clinician: Referring [name]: Treating [name]/Extender:Robson, [name], FRED Weeks in Treatment: 0 Abuse/Suicide Risk Screen Items Answer ABUSE RISK SCREEN: Has anyone close to you tried to hurt or harm you recentlyo No Do you feel uncomfortable with anyone in your familyo No Has anyone forced you do things that you didnt want to doo No Electronic Signature(s) Signed: 10/30/2019 6:13:22 PM By: Baruch Gouty RN, BSN Entered By: Baruch Gouty on 10/30/2019 14:09:22 -------------------------------------------------------------------------------- Activities of Daily Living Details Patient Name: Date of Service: [name], [name] 10/30/2019 1:15 PM Medical Record [id number] Patient Account Number: [id number] Date of Birth/Sex: Treating RN: 1961-01-01 (59 y.o. [name] Primary Care [name]: Dustin Folks Other Clinician: Referring [name]: Treating Louvina Diaz/Extender:Robson, [name], FRED Weeks in Treatment: 0 Activities of Daily Living Items Answer Activities of Daily Living (Please select one for each item) Drive Automobile Completely Able Take Medications Completely Able Use Telephone Completely Able Care for Appearance Need Assistance Use Toilet Completely Able Bath / Shower Need Assistance Dress Self Need Assistance Feed Self Completely Able Walk Need Assistance Get In / Out Bed Completely [place] for Self Completely Able Electronic Signature(s) Signed: 10/30/2019 6:13:22 PM By: Baruch Gouty RN, BSN Entered By:  Baruch Gouty on 10/30/2019 14:11:08 -------------------------------------------------------------------------------- Education Screening Details Patient Name: Date of Service: [name] 10/30/2019 1:15 PM Medical Record [id number] Patient Account Number: [id number] Date of Birth/Sex: Treating RN: 09-Sep-1960 (59 y.o. [name] Primary Care [name]: Dustin Folks Other Clinician: Referring [name]: Treating [name]/Extender:Robson, [name], FRED Weeks in Treatment: 0 Primary Learner Assessed: Patient Learning Preferences/Education Level/Primary Language Learning Preference: Explanation Highest Education Level: High School Preferred Language: English Cognitive Barrier Language Barrier: No Translator Needed: No Memory Deficit: No Emotional Barrier: No Cultural/Religious Beliefs Affecting Medical Care: No Physical Barrier Impaired Vision: Yes Glasses Impaired Hearing: No Decreased Hand dexterity: No Knowledge/Comprehension Knowledge Level: High Comprehension Level: High Ability to understand written High instructions: Ability to understand verbal High instructions: Motivation Anxiety Level: Calm Cooperation: Cooperative Education Importance: Acknowledges Need Interest in Health Problems: Asks Questions Perception: Coherent Willingness to Engage in Self- Medium Management Activities: Readiness to Engage in Self- Medium Management Activities: Electronic Signature(s) Signed: 10/30/2019 6:13:22 PM By: Baruch Gouty RN, BSN Entered By: Baruch Gouty on 10/30/2019 14:12:51 -------------------------------------------------------------------------------- Fall Risk Assessment Details Patient Name: Date of Service: Diaz, Joshua K. 10/30/2019 1:15 PM Medical Record [id number] Patient Account Number: [id number] Date of Birth/Sex: Treating RN: [birth year] (59 y.o. [name] Primary Care [name]: Dustin Folks Other  Clinician: Referring [name]: Treating [name]/Extender:Robson, [name], FRED Weeks in Treatment: 0 Fall Risk Assessment Items Have you had 2 or more falls in the last 12 monthso 0 No Have you had any fall that resulted in injury in the last 12 monthso 0 No FALLS RISK SCREEN History of falling - immediate or within 3 months 0 No Secondary diagnosis (Do you have 2 or more medical diagnoseso) 0 No Ambulatory aid None/bed rest/wheelchair/nurse 0 No Crutches/cane/walker 15 Yes Furniture 0 No Intravenous therapy Access/Saline/Heparin Lock 0 No Weak (short steps with or without shuffle, stooped but able to lift head 10 Yes while walking, may seek support from furniture) Impaired (short steps with  shuffle, may have difficulty arising from chair, 0 No head down, impaired balance) Mental Status Oriented to own ability 0 Yes Overestimates or forgets limitations 0 No Risk Level: Medium Risk Score: 25 Electronic Signature(s) Signed: 10/30/2019 6:13:22 PM By: Baruch Gouty RN, BSN Entered By: Baruch Gouty on 10/30/2019 14:14:02 -------------------------------------------------------------------------------- Foot Assessment Details Patient Name: Date of Service: [name] 10/30/2019 1:15 PM Medical Record [id number] Patient Account Number: [id number] Date of Birth/Sex: Treating RN: 09-05-1960 (59 y.o. [name] Primary Care [name]: Dustin Folks Other Clinician: Referring [name]: Treating [name]/Extender:Robson, [name], FRED Weeks in Treatment: 0 Foot Assessment Items Site Locations + = Sensation present, - = Sensation absent, C = Callus, U = Ulcer R = Redness, W = Warmth, M = Maceration, PU = Pre-ulcerative lesion F = Fissure, S = Swelling, D = Dryness Assessment Right: Left: Other Deformity: No No Prior Foot Ulcer: No No Prior Amputation: No No Charcot Joint: No No Ambulatory Status: Ambulatory With Help Assistance Device:  Walker Gait: Steady Electronic Signature(s) Signed: 10/30/2019 6:13:22 PM By: Baruch Gouty RN, BSN Entered By: Baruch Gouty on 10/30/2019 14:15:39 -------------------------------------------------------------------------------- Nutrition Risk Screening Details Patient Name: Date of Service: [name], [name] 10/30/2019 1:15 PM Medical Record [id number] Patient Account Number: [id number] Date of Birth/Sex: Treating RN: 1961-05-23 (59 y.o. [name] Primary Care Ambrea Hegler: Dustin Folks Other Clinician: Referring Antwan Bribiesca: Treating Marshea Wisher/Extender:Robson, [name], FRED Weeks in Treatment: 0 Height (in): 69 Weight (lbs): 142 Body Mass Index (BMI): 21 Nutrition Risk Screening Items Score Screening NUTRITION RISK SCREEN: I have an illness or condition that made me change the kind and/or 0 No amount of food I eat I eat fewer than two meals per day 0 No I eat few fruits and vegetables, or milk products 2 Yes I have three or more drinks of beer, liquor or wine almost every day 0 No I have tooth or mouth problems that make it hard for me to eat 0 No I don't always have enough money to buy the food I need 0 No I eat alone most of the time 1 Yes I take three or more different prescribed or over-the-counter drugs a day 1 Yes 2 Yes Without wanting to, I have lost or gained 10 pounds in the last six months I am not always physically able to shop, cook and/or feed myself 0 No Nutrition Protocols Good Risk Protocol Moderate Risk Protocol Provide education on High Risk Proctocol 0 nutrition Risk Level: High Risk Score: 6 Electronic Signature(s) Signed: 10/30/2019 6:13:22 PM By: Baruch Gouty RN, BSN Entered By: Baruch Gouty on 10/30/2019 14:15:18

## 2019-10-31 ENCOUNTER — Other Ambulatory Visit: Payer: Self-pay

## 2019-10-31 ENCOUNTER — Other Ambulatory Visit: Payer: Medicare Other | Admitting: Hospice

## 2019-10-31 DIAGNOSIS — N186 End stage renal disease: Secondary | ICD-10-CM

## 2019-10-31 DIAGNOSIS — Z515 Encounter for palliative care: Secondary | ICD-10-CM

## 2019-10-31 NOTE — Progress Notes (Signed)
[facility] Palliative Care Consult Note Telephone: [phone]  Fax: [phone]  PATIENT NAME: [name] DOB: 11/29/1960 MRN: [id number]  PRIMARY CARE PROVIDER:   Sonia Diaz., FNP  REFERRING PROVIDER:  Sonia Diaz., [place],  [place] [address]  RESPONSIBLE PARTY:Self [phone]  TELEHEALTH VISIT STATEMENT Due to the COVID-19 crisis, this visit was done via telephone from my office. It was initiated and consented to by this patient and/or family.  RECOMMENDATIONS/PLAN:  Advance Care Planning/Goals of Care: Telehealth Visit consisted of building trust and follow up on palliative care. He remains a DNR;  Goals of care include to maximize quality of life and symptom management.Patient accepted in person visit scheduled for 12/13/2019 for discussions on goals of care clarification.  Symptom management:Patient with multiple hospitalizations since last visit, reasons ranging from abdominal pain to ascites of liver, ESRD. He said  ' I am a sick man so I have to go to the hospital'. He explained there was leak in his stomach and he will be going to [facility] next month to see his Hepatologist related to his liver cirrhosis. He said he feels better recently than before, his appetite has improved; he denied shortness of breath, pain/discomfort, palpitations, chest pain. He continues on Dialysis for ESRD and also continues with paracentesis. Education reinforced on adhering to dialysis schedules and appointments with his specialist, considering his multiple health issues. He verbalized understanding and said he will continue to try and comply. He reported he now has Home health services - nursing aid come to help at home.  Follow EU:MPNTIRWERX care will continue to follow patient for goals of care clarification and symptom management I spent48minutes providing this consultation; time includes chart review, documentation.  More than 50% of the time in this consultation was spent on coordinating communication  HISTORY OF PRESENT ILLNESS:[name] a 59 y.o.year oldmalewith multiple medical problems including ESRD on HD, cirrhosis of the liver, Hep C, CAD, pulmonary HTN, CHF,COPD.Palliative Care was asked to help address goals of care  CODE STATUS: DNR  PPS: 60% HOSPICE ELIGIBILITY/DIAGNOSIS: TBD  PAST MEDICAL HISTORY:  Past Medical History:  Diagnosis Date  . Anemia   . Anxiety   . Arthritis    left shoulder  . Atherosclerosis of aorta ([place])   . Cardiomegaly   . Chest pain    DATE UNKNOWN, C/O PERIODICALLY  . Cocaine abuse ([place])   . COPD exacerbation ([place]) 08/17/2016  . Coronary artery disease    stent 02/22/17  . ESRD (end stage renal disease) on dialysis ([place])    "[name]; MWF" (07/04/2017)  . GERD (gastroesophageal reflux disease)    DATE UNKNOWN  . Hemorrhoids   . Hepatitis B, chronic ([place])   . Hepatitis C   . History of kidney stones   . Hyperkalemia   . Hypertension   . Kidney failure   . Metabolic bone disease    Patient denies  . Mitral stenosis   . Myocardial infarction ([place])   . Pneumonia   . Pulmonary edema   . Solitary rectal ulcer syndrome 07/2017   at [facility] for rectal bleeding  . Tubular adenoma of colon     SOCIAL HX:  Social History   Tobacco Use  . Smoking status: Current Every Day Smoker    Packs/day: 0.50    Years: 43.00    Pack years: 21.50    Types: Cigarettes    Start date: 08/13/1973  .  Smokeless tobacco: Never Used  . Tobacco comment: i dont know i just make   Substance Use Topics  . Alcohol use: Not Currently    Comment: quit drinking in 2017    ALLERGIES:  Allergies  Allergen Reactions  . Tramadol Itching and Other (See Comments)  . Morphine And Related Other (See Comments)    Stomach pain  . Pollen Extract     Other reaction(s): Sneezing (finding)  . Acetaminophen Nausea Only    Stomach ache Other reaction(s):  Other (See Comments) Stomach ache  . Aspirin Other (See Comments) and Itching    STOMACH PAIN Other reaction(s): Unknown STOMACH PAIN  . Clonidine Derivatives Itching     PERTINENT MEDICATIONS:  Outpatient Encounter Medications as of 10/31/2019  Medication Sig  . albuterol (VENTOLIN HFA) 108 (90 Base) MCG/ACT inhaler Inhale 2 puffs into the lungs every 6 (six) hours as needed for wheezing or shortness of breath.  . budesonide-formoterol (SYMBICORT) 160-4.5 MCG/ACT inhaler Inhale 2 puffs into the lungs 2 (two) times daily.  Marland Kitchen diltiazem (TIAZAC) 240 MG 24 hr capsule Take 240 mg by mouth daily.  . hydrALAZINE (APRESOLINE) 100 MG tablet Take 100 mg by mouth 2 (two) times daily.  . hydrOXYzine (ATARAX/VISTARIL) 25 MG tablet Take 25 mg by mouth 2 (two) times daily as needed for itching.   . lactulose (CHRONULAC) 10 GM/15ML solution Take 45 mLs (30 g total) by mouth daily as needed for mild constipation.  . lidocaine (LIDODERM) 5 % Place 1 patch onto the skin daily. Remove & Discard patch within 12 hours or as directed by MD  . metoprolol tartrate (LOPRESSOR) 100 MG tablet Take 1 tablet (100 mg total) by mouth 2 (two) times daily.  . montelukast (SINGULAIR) 10 MG tablet Take 10 mg by mouth every evening.   . ondansetron (ZOFRAN) 4 MG tablet Take 4 mg by mouth every 4 (four) hours as needed for nausea or vomiting.  . Oxycodone HCl 10 MG TABS Take 10 mg by mouth 3 (three) times daily as needed (pain).   . pantoprazole (PROTONIX) 40 MG tablet Take 40 mg by mouth daily.  . sevelamer carbonate (RENVELA) 800 MG tablet Take 1,600-3,200 mg by mouth See admin instructions. Take 4 tablets (3200 mg) by mouth 3 times daily with meals and 2 tablets (1600 mg) with snacks  . zolpidem (AMBIEN) 10 MG tablet Take 10 mg by mouth at bedtime as needed for sleep.   No facility-administered encounter medications on file as of 10/31/2019.   Teodoro Spray, NP

## 2019-11-05 ENCOUNTER — Other Ambulatory Visit: Payer: Self-pay | Admitting: Cardiology

## 2019-11-05 DIAGNOSIS — I10 Essential (primary) hypertension: Secondary | ICD-10-CM

## 2019-11-05 DIAGNOSIS — I480 Paroxysmal atrial fibrillation: Secondary | ICD-10-CM

## 2019-11-06 ENCOUNTER — Other Ambulatory Visit: Payer: Self-pay | Admitting: Internal Medicine

## 2019-11-06 ENCOUNTER — Other Ambulatory Visit (HOSPITAL_COMMUNITY): Payer: Self-pay | Admitting: Internal Medicine

## 2019-11-06 DIAGNOSIS — R0600 Dyspnea, unspecified: Secondary | ICD-10-CM

## 2019-11-06 DIAGNOSIS — R0609 Other forms of dyspnea: Secondary | ICD-10-CM

## 2019-11-07 LAB — DRUG SCREEN 10 W/CONF, SERUM
Amphetamines, IA: NEGATIVE ng/mL
Barbiturates, IA: NEGATIVE ug/mL
Benzodiazepines, IA: NEGATIVE ng/mL
Cocaine & Metabolite, IA: NEGATIVE ng/mL
Methadone, IA: NEGATIVE ng/mL
Opiates, IA: NEGATIVE ng/mL
Oxycodones, IA: NEGATIVE ng/mL
Phencyclidine, IA: NEGATIVE ng/mL
Propoxyphene, IA: NEGATIVE ng/mL
THC(Marijuana) Metabolite, IA: NEGATIVE ng/mL

## 2019-11-07 LAB — OPIATES,MS,WB/SP RFX
6-Acetylmorphine: NEGATIVE
Codeine: NEGATIVE ng/mL
Dihydrocodeine: NEGATIVE ng/mL
Hydrocodone: NEGATIVE ng/mL
Hydromorphone: NEGATIVE ng/mL
Morphine: NEGATIVE ng/mL
Opiate Confirmation: NEGATIVE

## 2019-11-07 LAB — OXYCODONES,MS,WB/SP RFX
Oxycocone: NEGATIVE ng/mL
Oxycodones Confirmation: NEGATIVE
Oxymorphone: NEGATIVE ng/mL

## 2019-11-08 ENCOUNTER — Encounter (HOSPITAL_BASED_OUTPATIENT_CLINIC_OR_DEPARTMENT_OTHER): Payer: Medicare Other | Admitting: Internal Medicine

## 2019-11-08 ENCOUNTER — Other Ambulatory Visit: Payer: Self-pay

## 2019-11-08 ENCOUNTER — Ambulatory Visit (HOSPITAL_COMMUNITY)
Admission: RE | Admit: 2019-11-08 | Discharge: 2019-11-08 | Disposition: A | Discharge status: Home / Self Care | Payer: Medicare Other | Source: Ambulatory Visit | Attending: Gastroenterology | Admitting: Gastroenterology

## 2019-11-08 DIAGNOSIS — K7460 Unspecified cirrhosis of liver: Secondary | ICD-10-CM | POA: Diagnosis not present

## 2019-11-08 DIAGNOSIS — R188 Other ascites: Secondary | ICD-10-CM | POA: Diagnosis not present

## 2019-11-08 DIAGNOSIS — L89150 Pressure ulcer of sacral region, unstageable: Secondary | ICD-10-CM | POA: Diagnosis not present

## 2019-11-08 HISTORY — PX: IR PARACENTESIS: IMG2679

## 2019-11-08 LAB — BODY FLUID CELL COUNT WITH DIFFERENTIAL
Eos, Fluid: 0 %
Lymphs, Fluid: 33 %
Monocyte-Macrophage-Serous Fluid: 57 % (ref 50–90)
Neutrophil Count, Fluid: 10 % (ref 0–25)

## 2019-11-08 MED ORDER — LIDOCAINE HCL 1 % IJ SOLN
INTRAMUSCULAR | Status: AC
  Filled 2019-11-08: qty 20

## 2019-11-08 NOTE — Procedures (Signed)
PROCEDURE SUMMARY:  Successful image-guided paracentesis from the left lower abdomen.  Yielded 2.7 liters of serosanguineous fluid.  No immediate complications.  EBL: zero Patient tolerated well.   Specimen was sent for labs.  Please see imaging section of Epic for full dictation.  Joaquim Nam PA-C 11/08/2019 2:45 PM

## 2019-11-08 NOTE — Progress Notes (Addendum)
[name], [name] ([id number]) Visit Report for 11/08/2019 HPI Details Patient Name: Date of Service: Joshua Curt Jews LD Diaz. 11/08/2019 10:30 A M Medical Record Number: [id number] Patient Account Number: [id number] Date of Birth/Sex: Treating RN: Jul 21, 1960 (59 y.o. Hessie Diener Primary Care Provider: Dustin Folks Other Clinician: Referring Provider: Treating Provider/Extender: Stefanie Libel, FRED Weeks in Treatment: 1 History of Present Illness HPI Description: ADMISSION 10/30/2019 This is a 59 year old man with multiple medical problems predominantly chronic renal failure on dialysis and cirrhosis with ascites secondary to hepatitis B C or potentially alcohol abuse. He has an unstageable wound on his sacrum. I think this was noted during one of his innumerable trips to the ER and he was sent here. He has been using Neosporin. He is completely unable to tell us any time frame for when this started. He says he thinks this was in the hospital but again is unsure. He has no pictures on his phone. He lives alone and does not really have a lot of help with topical dressings. Past medical history includes chronic renal failure on dialysis, exact etiology of his renal failure is not really clear. He has hepatitis B C paroxysmal atrial fibrillation cirrhosis with recurrent ascites requiring multiple paracenteses, substance abuse, left arm shunt infection problems, COPD, hypertension and coronary artery disease. 4/29; some improvement in the wound area. We have been using Science writer) Signed: 11/13/2019 1:05:55 PM By: Linton Ham MD Entered By: Linton Ham on 11/13/2019 12:54:33 -------------------------------------------------------------------------------- Physical Exam Details Patient Name: Date of Service: [name], [name]. 11/08/2019 10:30 A M Medical Record Number: [id number] Patient Account Number: [id number] Date of Birth/Sex: Treating RN: 06-02-1961 (59  y.o. Hessie Diener Primary Care Provider: Dustin Folks Other Clinician: Referring Provider: Treating Provider/Extender: Stefanie Libel, FRED Weeks in Treatment: 1 Notes Wound exam; the areas on the lower sacrum. Somewhat of a better surface. I am going to continue Medihoney on this. No mechanical debridement today Electronic Signature(s) Signed: 11/13/2019 1:05:55 PM By: Linton Ham MD Entered By: Linton Ham on 11/13/2019 12:54:56 -------------------------------------------------------------------------------- Physician Orders Details Patient Name: Date of Service: [name], [name]. 11/08/2019 10:30 A M Medical Record Number: [id number] Patient Account Number: [id number] Date of Birth/Sex: Treating RN: 1961/05/16 (59 y.o. Hessie Diener Primary Care Provider: Dustin Folks Other Clinician: Referring Provider: Treating Provider/Extender: Stefanie Libel, FRED Weeks in Treatment: 1 Verbal / Phone Orders: No Diagnosis Coding ICD-10 Coding Code Description L89.150 Pressure ulcer of sacral region, unstageable K74.69 Other cirrhosis of liver Follow-up Appointments Return Appointment in 1 week. Dressing Change Frequency Change dressing every day. Wound Cleansing May shower and wash wound with soap and water. Primary Wound Dressing Wound #1 Sacrum Medihoney gel Secondary Dressing Dry Gauze - secure with tape or bordered foam. Off-Loading Turn and reposition every 2 hours Electronic Signature(s) Signed: 11/08/2019 5:33:37 PM By: Deon Pilling Signed: 11/08/2019 5:41:26 PM By: Linton Ham MD Entered By: Deon Pilling on 11/08/2019 10:42:19 -------------------------------------------------------------------------------- Problem List Details Patient Name: Date of Service: [name], [name]. 11/08/2019 10:30 A M Medical Record Number: [id number] Patient Account Number: [id number] Date of Birth/Sex: Treating RN: 1960/08/23 (59 y.o. Hessie Diener Primary Care Provider: Dustin Folks Other Clinician: Referring Provider: Treating Provider/Extender: Stefanie Libel, FRED Weeks in Treatment: 1 Active Problems ICD-10 Encounter Code Description Active Date MDM Diagnosis L89.150 Pressure ulcer of sacral region, unstageable 10/30/2019 No Yes K74.69 Other cirrhosis of liver 10/30/2019 No Yes Inactive Problems Resolved Problems Electronic Signature(s)  Signed: 11/13/2019 1:05:55 PM By: Linton Ham MD Previous Signature: 11/08/2019 5:33:37 PM Version By: Deon Pilling Previous Signature: 11/08/2019 5:41:26 PM Version By: Linton Ham MD Entered By: Linton Ham on 11/13/2019 12:52:02 -------------------------------------------------------------------------------- Progress Note Details Patient Name: Date of Service: [name], [name]. 11/08/2019 10:30 A M Medical Record Number: [id number] Patient Account Number: [id number] Date of Birth/Sex: Treating RN: January 10, 1961 (59 y.o. Hessie Diener Primary Care Provider: Dustin Folks Other Clinician: Referring Provider: Treating Provider/Extender: Stefanie Libel, FRED Weeks in Treatment: 1 Subjective History of Present Illness (HPI) ADMISSION 10/30/2019 This is a 59 year old man with multiple medical problems predominantly chronic renal failure on dialysis and cirrhosis with ascites secondary to hepatitis B C or potentially alcohol abuse. He has an unstageable wound on his sacrum. I think this was noted during one of his innumerable trips to the ER and he was sent here. He has been using Neosporin. He is completely unable to tell us any time frame for when this started. He says he thinks this was in the hospital but again is unsure. He has no pictures on his phone. He lives alone and does not really have a lot of help with topical dressings. Past medical history includes chronic renal failure on dialysis, exact etiology of his renal failure is not really clear. He has  hepatitis B C paroxysmal atrial fibrillation cirrhosis with recurrent ascites requiring multiple paracenteses, substance abuse, left arm shunt infection problems, COPD, hypertension and coronary artery disease. 4/29; some improvement in the wound area. We have been using Medihoney Objective Constitutional Vitals Time Taken: 10:15 AM, Height: 69 in, Weight: 142 lbs, BMI: 21, Temperature: 97.8 F, Pulse: 82 bpm, Respiratory Rate: 18 breaths/min, Blood Pressure: 158/54 mmHg. Integumentary (Hair, Skin) Wound #1 status is Open. Original cause of wound was Pressure Injury. The wound is located on the Sacrum. The wound measures 3.2cm length x 1.9cm width x 0.1cm depth; 4.775cm^2 area and 0.478cm^3 volume. There is Fat Layer (Subcutaneous Tissue) Exposed exposed. There is no tunneling or undermining noted. There is a medium amount of serosanguineous drainage noted. The wound margin is flat and intact. There is medium (34-66%) pink granulation within the wound bed. There is a medium (34-66%) amount of necrotic tissue within the wound bed including Adherent Slough. Assessment Active Problems ICD-10 Pressure ulcer of sacral region, unstageable Other cirrhosis of liver Plan Follow-up Appointments: Return Appointment in 1 week. Dressing Change Frequency: Change dressing every day. Wound Cleansing: May shower and wash wound with soap and water. Primary Wound Dressing: Wound #1 Sacrum: Medihoney gel Secondary Dressing: Dry Gauze - secure with tape or bordered foam. Off-Loading: Turn and reposition every 2 hours 1. I did not change the primary dressing which is Medihoney gel with gauze 2. He does not have any access to care or products. We have a limited repertoire to choose from Electronic Signature(s) Signed: 11/13/2019 1:05:55 PM By: Linton Ham MD Entered By: Linton Ham on 11/13/2019 12:55:43 -------------------------------------------------------------------------------- SuperBill  Details Patient Name: Date of Service: [name], [name]. 11/08/2019 Medical Record Number: [id number] Patient Account Number: [id number] Date of Birth/Sex: Treating RN: 11/10/1960 (59 y.o. Hessie Diener Primary Care Provider: Dustin Folks Other Clinician: Referring Provider: Treating Provider/Extender: Stefanie Libel, FRED Weeks in Treatment: 1 Diagnosis Coding ICD-10 Codes Code Description L89.150 Pressure ulcer of sacral region, unstageable K74.69 Other cirrhosis of liver Facility Procedures The patient participates with Medicare or their insurance follows the Medicare Facility Guidelines: CPT4 Code Description Modifier Quantity [id number] -  WOUND CARE VISIT-LEV 3 EST PT 1 Physician Procedures : CPT4 Code Description Modifier [id number] [phone] - WC PHYS LEVEL 2 - EST PT ICD-10 Diagnosis Description L89.150 Pressure ulcer of sacral region, unstageable Quantity: 1 Electronic Signature(s) Signed: 11/13/2019 1:05:55 PM By: Linton Ham MD Previous Signature: 11/08/2019 5:33:37 PM Version By: Deon Pilling Previous Signature: 11/08/2019 5:41:26 PM Version By: Linton Ham MD Entered By: Linton Ham on 11/13/2019 12:56:02

## 2019-11-12 ENCOUNTER — Emergency Department (HOSPITAL_COMMUNITY)
Admission: EM | Admit: 2019-11-12 | Discharge: 2019-11-13 | Disposition: A | Discharge status: Home / Self Care | Payer: Medicare Other | Attending: Emergency Medicine | Admitting: Emergency Medicine

## 2019-11-12 ENCOUNTER — Encounter (HOSPITAL_COMMUNITY): Payer: Self-pay | Admitting: Emergency Medicine

## 2019-11-12 ENCOUNTER — Other Ambulatory Visit: Payer: Self-pay

## 2019-11-12 ENCOUNTER — Emergency Department (HOSPITAL_COMMUNITY): Payer: Medicare Other

## 2019-11-12 DIAGNOSIS — R0602 Shortness of breath: Secondary | ICD-10-CM | POA: Insufficient documentation

## 2019-11-12 DIAGNOSIS — R002 Palpitations: Secondary | ICD-10-CM | POA: Insufficient documentation

## 2019-11-12 DIAGNOSIS — Z992 Dependence on renal dialysis: Secondary | ICD-10-CM | POA: Diagnosis not present

## 2019-11-12 DIAGNOSIS — N186 End stage renal disease: Secondary | ICD-10-CM | POA: Diagnosis not present

## 2019-11-12 DIAGNOSIS — J449 Chronic obstructive pulmonary disease, unspecified: Secondary | ICD-10-CM | POA: Insufficient documentation

## 2019-11-12 DIAGNOSIS — I2510 Atherosclerotic heart disease of native coronary artery without angina pectoris: Secondary | ICD-10-CM | POA: Diagnosis not present

## 2019-11-12 DIAGNOSIS — R000 Tachycardia, unspecified: Secondary | ICD-10-CM

## 2019-11-12 DIAGNOSIS — Z79899 Other long term (current) drug therapy: Secondary | ICD-10-CM | POA: Diagnosis not present

## 2019-11-12 DIAGNOSIS — F17210 Nicotine dependence, cigarettes, uncomplicated: Secondary | ICD-10-CM | POA: Diagnosis not present

## 2019-11-12 DIAGNOSIS — I120 Hypertensive chronic kidney disease with stage 5 chronic kidney disease or end stage renal disease: Secondary | ICD-10-CM | POA: Diagnosis not present

## 2019-11-12 NOTE — ED Provider Notes (Addendum)
[name] Provider Note   CSN: [id number] Arrival date & time: 11/12/19  2229     History Chief Complaint  Patient presents with  . Tachycardia    [name] is a 59 y.o. male.  HPI     This is a 59 year old male with history of end-stage renal disease on dialysis Monday, Wednesday, Friday, hepatitis B and C, hypertension, cocaine abuse, COPD who presents with tachycardia.  Patient reports that "I could hear my heart in my ear."  He denies any chest pain or shortness of breath during this time.  EMS found him to be in heart rate of the 160s.  He was given amiodarone.  Patient reports he had a full dialysis session with the exception of a 1 hr because "you know I get sick."  Currently he states he feels better and no longer feels his heart rate in his ear.  Initially, he was given 100 mg amnio bolus by EMS.  Initial heart rate at triage was 143 but EKG with heart rate in the 80s.  He is irregular.  He has a history of atrial fibrillation and reports that he has been compliant with his medications.  Denies any recent fevers or cough.  Does report some lower extremity swelling.  No shortness of breath.  Past Medical History:  Diagnosis Date  . Anemia   . Anxiety   . Arthritis    left shoulder  . Atherosclerosis of aorta ([place])   . Cardiomegaly   . Chest pain    DATE UNKNOWN, C/O PERIODICALLY  . Cocaine abuse ([place])   . COPD exacerbation ([place]) 08/17/2016  . Coronary artery disease    stent 02/22/17  . ESRD (end stage renal disease) on dialysis ([place])    "[name]; MWF" (07/04/2017)  . GERD (gastroesophageal reflux disease)    DATE UNKNOWN  . Hemorrhoids   . Hepatitis B, chronic ([place])   . Hepatitis C   . History of kidney stones   . Hyperkalemia   . Hypertension   . Kidney failure   . Metabolic bone disease    Patient denies  . Mitral stenosis   . Myocardial infarction ([place])   . Pneumonia   . Pulmonary edema   . Solitary  rectal ulcer syndrome 07/2017   at flex sig for rectal bleeding  . Tubular adenoma of colon     Patient Active Problem List   Diagnosis Date Noted  . Acetaminophen overdose 10/27/2019  . ESRD (end stage renal disease) ([place])   . Goals of care, counseling/discussion   . Hypoxia 10/04/2019  . Advanced care planning/counseling discussion   . Renal failure 09/26/2019  . Dyspnea 06/09/2019  . Acquired thrombophilia ([place]) 06/05/2019  . A-fib ([place]) 05/30/2019  . Atrial fibrillation with RVR ([place]) 05/29/2019  . Melena   . Pressure injury of skin 03/09/2019  . Abdominal distention   . Volume overload 12/28/2018  . Sepsis ([place]) 09/12/2018  . Atherosclerosis of native coronary artery of native heart without angina pectoris 03/11/2018  . Benign neoplasm of cecum   . Benign neoplasm of ascending colon   . Benign neoplasm of descending colon   . Benign neoplasm of rectum   . Paroxysmal atrial fibrillation ([place]) 01/23/2018  . Hx of colonic polyps 01/20/2018  . End-stage renal disease on hemodialysis ([place]) 11/21/2017  . GERD (gastroesophageal reflux disease) 11/16/2017  . Decompensated hepatic cirrhosis ([place]) 11/15/2017  . DNR (do not resuscitate)   . Palliative  care by specialist   . Hyponatremia 11/04/2017  . SBP (spontaneous bacterial peritonitis) ([place]) 10/30/2017  . Liver disease, chronic 10/30/2017  . SOB (shortness of breath)   . Abdominal pain 10/28/2017  . Upper airway cough syndrome with flattening on f/v loop 10/13/17 c/w vcd 10/17/2017  . Elevated diaphragm 10/13/2017  . Ileus ([place]) 09/29/2017  . QT prolongation 09/29/2017  . Malnutrition of moderate degree 09/29/2017  . Sinus congestion 09/03/2017  . Symptomatic anemia 09/02/2017  . Other cirrhosis of liver ([place]) 09/02/2017  . Left bundle branch block 09/02/2017  . Mitral stenosis 09/02/2017  . Hematochezia 07/15/2017  . Wide-complex tachycardia ([place])   . Endotracheally intubated   . ESRD on dialysis ([place]) 07/04/2017  .  Acute respiratory failure with hypoxia ([place]) 06/18/2017  . CKD (chronic kidney disease) stage V requiring chronic dialysis ([place]) 06/18/2017  . History of Cocaine abuse ([place]) 06/18/2017  . Hypertension 06/18/2017  . Infection of AV graft for dialysis ([place]) 06/18/2017  . Anxiety 06/18/2017  . Anemia due to chronic kidney disease 06/18/2017  . Atypical atrial flutter ([place]) 06/18/2017  . Personality disorder ([place]) 06/13/2017  . Cellulitis 06/12/2017  . Adjustment disorder with mixed anxiety and depressed mood 06/10/2017  . Suicidal ideation 06/10/2017  . Arm wound, left, sequela 06/10/2017  . Dyspnea on exertion 05/29/2017  . Tachycardia 05/29/2017  . Hyperkalemia 05/22/2017  . Acute metabolic encephalopathy   . Anemia 04/23/2017  . Ascites 04/23/2017  . COPD (chronic obstructive pulmonary disease) ([place]) 04/23/2017  . Acute on chronic respiratory failure with hypoxia ([place]) 03/25/2017  . Arrhythmia 03/25/2017  . COPD GOLD 0 with flattening on inps f/v  09/27/2016  . Essential hypertension 09/27/2016  . Fluid overload 08/30/2016  . COPD exacerbation ([place]) 08/17/2016  . Hypertensive urgency 08/17/2016  . Respiratory failure ([place]) 08/17/2016  . Problem with dialysis access ([place]) 07/23/2016  . Chronic hepatitis B ([place]) 03/05/2014  . Chronic hepatitis C without hepatic coma ([place]) 03/05/2014  . Internal hemorrhoids with bleeding, swelling and itching 03/05/2014  . Thrombocytopenia ([place]) 03/05/2014  . Chest pain 02/27/2014  . Alcohol abuse 04/14/2009  . Cigarette smoker 04/14/2009  . GANGLION CYST 04/14/2009    Past Surgical History:  Procedure Laterality Date  . A/V FISTULAGRAM Left 05/26/2017   Procedure: A/V FISTULAGRAM;  Surgeon: Conrad [place], MD;  Location: [place] [facility];  Service: Cardiovascular;  Laterality: Left;  . A/V FISTULAGRAM Right 11/18/2017   Procedure: A/V FISTULAGRAM - Right Arm;  Surgeon: Elam Dutch, MD;  Location: [place] [facility];  Service:  Cardiovascular;  Laterality: Right;  . APPLICATION OF WOUND VAC Left 06/14/2017   Procedure: APPLICATION OF WOUND VAC;  Surgeon: Katha Cabal, MD;  Location: [facility];  Service: Vascular;  Laterality: Left;  . AV FISTULA PLACEMENT  2012   BELIEVED WAS PLACED IN JUNE  . AV FISTULA PLACEMENT Right 08/09/2017   Procedure: Creation Right arm ARTERIOVENOUS BRACHIOCEPOHALIC FISTULA;  Surgeon: Elam Dutch, MD;  Location[facility];  Service: Vascular;  Laterality: Right;  . AV FISTULA PLACEMENT Right 11/22/2017   Procedure: INSERTION OF ARTERIOVENOUS (AV) GORE-TEX GRAFT RIGHT UPPER ARM;  Surgeon: Elam Dutch, MD;  Location: [place];  Service: Vascular;  Laterality: Right;  . BIOPSY  01/25/2018   Procedure: BIOPSY;  Surgeon: Jerene Bears, MD;  Location: [place];  Service: Gastroenterology;;  . BIOPSY  04/10/2019   Procedure: BIOPSY;  Surgeon: Jerene Bears, MD;  Location: [facility];  Service: Gastroenterology;;  .  COLONOSCOPY    . COLONOSCOPY WITH PROPOFOL N/A 01/25/2018   Procedure: COLONOSCOPY WITH PROPOFOL;  Surgeon: Jerene Bears, MD;  Location: [place];  Service: Gastroenterology;  Laterality: N/A;  . CORONARY STENT INTERVENTION N/A 02/22/2017   Procedure: CORONARY STENT INTERVENTION;  Surgeon: Nigel Mormon, MD;  Location: [place] [facility];  Service: Cardiovascular;  Laterality: N/A;  . ESOPHAGOGASTRODUODENOSCOPY (EGD) WITH PROPOFOL N/A 01/25/2018   Procedure: ESOPHAGOGASTRODUODENOSCOPY (EGD) WITH PROPOFOL;  Surgeon: Jerene Bears, MD;  Location: [place];  Service: Gastroenterology;  Laterality: N/A;  . ESOPHAGOGASTRODUODENOSCOPY (EGD) WITH PROPOFOL N/A 04/10/2019   Procedure: ESOPHAGOGASTRODUODENOSCOPY (EGD) WITH PROPOFOL;  Surgeon: Jerene Bears, MD;  [facility];  Service: Gastroenterology;  Laterality: N/A;  . FLEXIBLE SIGMOIDOSCOPY N/A 07/15/2017   Procedure: FLEXIBLE SIGMOIDOSCOPY;  Surgeon: Carol Ada, MD;  Location: [place];  Service:  Endoscopy;  Laterality: N/A;  . HEMORRHOID BANDING    . I & D EXTREMITY Left 06/01/2017   Procedure: IRRIGATION AND DEBRIDEMENT LEFT ARM HEMATOMA WITH LIGATION OF LEFT ARM AV FISTULA;  Surgeon: Elam Dutch, MD;  Location: [place];  Service: Vascular;  Laterality: Left;  . I & D EXTREMITY Left 06/14/2017   Procedure: IRRIGATION AND DEBRIDEMENT EXTREMITY;  Surgeon: Katha Cabal, MD;  Location: [facility];  Service: Vascular;  Laterality: Left;  . INSERTION OF DIALYSIS CATHETER  05/30/2017  . INSERTION OF DIALYSIS CATHETER N/A 05/30/2017   Procedure: INSERTION OF DIALYSIS CATHETER;  Surgeon: Elam Dutch, MD;  Location: [place];  Service: Vascular;  Laterality: N/A;  . IR PARACENTESIS  08/30/2017  . IR PARACENTESIS  09/29/2017  . IR PARACENTESIS  10/28/2017  . IR PARACENTESIS  11/09/2017  . IR PARACENTESIS  11/16/2017  . IR PARACENTESIS  11/28/2017  . IR PARACENTESIS  12/01/2017  . IR PARACENTESIS  12/06/2017  . IR PARACENTESIS  01/03/2018  . IR PARACENTESIS  01/23/2018  . IR PARACENTESIS  02/07/2018  . IR PARACENTESIS  02/21/2018  . IR PARACENTESIS  03/06/2018  . IR PARACENTESIS  03/17/2018  . IR PARACENTESIS  04/04/2018  . IR PARACENTESIS  12/28/2018  . IR PARACENTESIS  01/08/2019  . IR PARACENTESIS  01/23/2019  . IR PARACENTESIS  02/01/2019  . IR PARACENTESIS  02/19/2019  . IR PARACENTESIS  03/01/2019  . IR PARACENTESIS  03/15/2019  . IR PARACENTESIS  04/03/2019  . IR PARACENTESIS  04/12/2019  . IR PARACENTESIS  05/01/2019  . IR PARACENTESIS  05/08/2019  . IR PARACENTESIS  05/24/2019  . IR PARACENTESIS  06/12/2019  . IR PARACENTESIS  07/09/2019  . IR PARACENTESIS  07/27/2019  . IR PARACENTESIS  08/09/2019  . IR PARACENTESIS  08/21/2019  . IR PARACENTESIS  09/17/2019  . IR PARACENTESIS  10/05/2019  . IR PARACENTESIS  10/29/2019  . IR PARACENTESIS  11/08/2019  . IR RADIOLOGIST EVAL & MGMT  02/14/2018  . IR RADIOLOGIST EVAL & MGMT  02/22/2019  . LEFT HEART CATH AND CORONARY ANGIOGRAPHY N/A 02/22/2017    Procedure: LEFT HEART CATH AND CORONARY ANGIOGRAPHY;  Surgeon: Nigel Mormon, MD;  Location: [place] [facility];  Service: Cardiovascular;  Laterality: N/A;  . LIGATION OF ARTERIOVENOUS  FISTULA Left 10/16/8293   Procedure: Plication of Left Arm Arteriovenous Fistula;  Surgeon: Elam Dutch, MD;  Location: [place];  Service: Vascular;  Laterality: Left;  . POLYPECTOMY    . POLYPECTOMY  01/25/2018   Procedure: POLYPECTOMY;  Surgeon: Jerene Bears, MD;  Location: [place];  Service:  Gastroenterology;;  . [place] OF ARTERIOVENOUS FISTULA Left 8/84/1660   Procedure: PLICATION OF DISTAL ANEURYSMAL SEGEMENT OF LEFT UPPER ARM ARTERIOVENOUS FISTULA;  Surgeon: Elam Dutch, MD;  Location: [place];  Service: Vascular;  Laterality: Left;  . REVISON OF ARTERIOVENOUS FISTULA Left 01/09/1600   Procedure: Plication of Left Upper Arm Fistula ;  Surgeon: Waynetta Sandy, MD;  Location: [place];  Service: Vascular;  Laterality: Left;  . SKIN GRAFT SPLIT THICKNESS LEG / FOOT Left    SKIN GRAFT SPLIT THICKNESS LEFT ARM DONOR SITE: LEFT ANTERIOR THIGH  . SKIN SPLIT GRAFT Left 07/04/2017   Procedure: SKIN GRAFT SPLIT THICKNESS LEFT ARM DONOR SITE: LEFT ANTERIOR THIGH;  Surgeon: Elam Dutch, MD;  Location: [place];  Service: Vascular;  Laterality: Left;  . THROMBECTOMY W/ EMBOLECTOMY Left 06/05/2017   Procedure: EXPLORATION OF LEFT ARM FOR BLEEDING; OVERSEWED PROXIMAL FISTULA;  Surgeon: Angelia Mould, MD;  Location: [place];  Service: Vascular;  Laterality: Left;  . WOUND EXPLORATION Left 06/03/2017   Procedure: WOUND EXPLORATION WITH WOUND VAC APPLICATION TO LEFT ARM;  Surgeon: Angelia Mould, MD;  [facility];  Service: Vascular;  Laterality: Left;       Family History  Problem Relation Age of Onset  . Heart disease Mother   . Lung cancer Mother   . Heart disease Father   . Malignant hyperthermia Father   . COPD Father   . Throat cancer Sister   . Esophageal cancer  Sister   . Hypertension Other   . COPD Other   . Colon cancer Neg Hx   . Colon polyps Neg Hx   . Rectal cancer Neg Hx   . Stomach cancer Neg Hx     Social History   Tobacco Use  . Smoking status: Current Every Day Smoker    Packs/day: 0.50    Years: 43.00    Pack years: 21.50    Types: Cigarettes    Start date: 08/13/1973  . Smokeless tobacco: Never Used  . Tobacco comment: i dont know i just make   Substance Use Topics  . Alcohol use: Not Currently    Comment: quit drinking in 2017  . Drug use: Not Currently    Types: Marijuana, Cocaine    Comment: reports using once every 3 months, 04-06-2019 was this     Home Medications Prior to Admission medications   Medication Sig Start Date End Date Taking? Authorizing Provider  albuterol (VENTOLIN HFA) 108 (90 Base) MCG/ACT inhaler Inhale 2 puffs into the lungs every 6 (six) hours as needed for wheezing or shortness of breath.   Yes [provider]  budesonide-formoterol (SYMBICORT) 160-4.5 MCG/ACT inhaler Inhale 2 puffs into the lungs 2 (two) times daily.   Yes [provider]  diltiazem (CARDIZEM CD) 240 MG 24 hr capsule TAKE 1 CAPSULE BY MOUTH EVERY DAY Patient taking differently: Take 240 mg by mouth daily.  11/05/19  Yes Adrian Prows, MD  hydrALAZINE (APRESOLINE) 100 MG tablet Take 100 mg by mouth 2 (two) times daily.   Yes [provider]  hydrOXYzine (ATARAX/VISTARIL) 25 MG tablet Take 25 mg by mouth 2 (two) times daily as needed for itching.  08/21/19  Yes [provider]  lactulose (CHRONULAC) 10 GM/15ML solution Take 45 mLs (30 g total) by mouth daily as needed for mild constipation. 10/01/19  Yes [name], MD  lidocaine (LIDODERM) 5 % Place 1 patch onto the skin daily. Remove & Discard patch within  12 hours or as directed by MD 10/28/19  Yes McDonald, Mia A, PA-C  metoprolol tartrate (LOPRESSOR) 100 MG tablet Take 1 tablet (100 mg total) by mouth 2 (two) times daily. 06/04/19  Yes Al Decant, MD  montelukast (SINGULAIR) 10 MG tablet Take 10 mg by mouth every evening.    Yes [provider]  ondansetron (ZOFRAN) 4 MG tablet Take 4 mg by mouth every 4 (four) hours as needed for nausea or vomiting. 09/18/19  Yes [provider]  Oxycodone HCl 10 MG TABS Take 10 mg by mouth 3 (three) times daily as needed (pain).  08/25/19  Yes [provider]  pantoprazole (PROTONIX) 40 MG tablet Take 40 mg by mouth daily.   Yes [provider]  sevelamer carbonate (RENVELA) 800 MG tablet Take 1,600-3,200 mg by mouth See admin instructions. Take 4 tablets (3200 mg) by mouth 3 times daily with meals and 2 tablets (1600 mg) with snacks   Yes [provider]  zolpidem (AMBIEN) 10 MG tablet Take 10 mg by mouth at bedtime as needed for sleep.   Yes [provider]    Allergies    Tramadol, Morphine and related, Pollen extract, Acetaminophen, Aspirin, and Clonidine derivatives  Review of Systems   Review of Systems  Constitutional: Negative for fever.  Respiratory: Negative for shortness of breath.   Cardiovascular: Positive for palpitations and leg swelling. Negative for chest pain.  Gastrointestinal: Negative for abdominal pain, nausea and vomiting.  Genitourinary: Negative for dysuria.  All other systems reviewed and are negative.   Physical Exam Updated Vital Signs BP (!) 142/71   Pulse 86   Temp 97.8 F (36.6 C) (Oral)   Resp 11   Ht 1.753 m (5\' 9" )   Wt 64.2 kg   SpO2 94%   BMI 20.90 kg/m   Physical Exam Vitals and nursing note reviewed.  Constitutional:      Appearance: He is well-developed.     Comments: Chronically ill-appearing, nontoxic  HENT:     Head: Normocephalic and atraumatic.     Nose: Nose normal.     Mouth/Throat:     Mouth: Mucous membranes are dry.  Eyes:     Pupils: Pupils are equal, round, and reactive to light.  Neck:     Comments: Distended neck veins Cardiovascular:     Rate and Rhythm: Normal  rate. Rhythm irregular.     Heart sounds: Normal heart sounds. No murmur.     Comments: Fistula right upper extremity with positive thrill Pulmonary:     Effort: Pulmonary effort is normal. No respiratory distress.     Breath sounds: Normal breath sounds. No wheezing.  Abdominal:     General: Bowel sounds are normal.     Palpations: Abdomen is soft.     Tenderness: There is no abdominal tenderness. There is no rebound.  Musculoskeletal:     Cervical back: Neck supple.     Right lower leg: Edema present.     Left lower leg: Edema present.     Comments: 3+ pitting bilateral lower extremity edema  Lymphadenopathy:     Cervical: No cervical adenopathy.  Skin:    General: Skin is warm and dry.  Neurological:     Mental Status: He is alert and oriented to person, place, and time.  Psychiatric:        Mood and Affect: Mood normal.     ED Results / Procedures / Treatments   Labs (all labs ordered are  listed, but only abnormal results are displayed) Labs Reviewed  BASIC METABOLIC PANEL - Abnormal; Notable for the following components:      Result Value   Potassium 5.2 (*)    Chloride 95 (*)    Glucose, Bld 124 (*)    BUN 33 (*)    Creatinine, Ser 5.39 (*)    Calcium 8.8 (*)    GFR calc non Af Amer 11 (*)    GFR calc Af Amer 13 (*)    All other components within normal limits  CBC - Abnormal; Notable for the following components:   RBC 2.82 (*)    Hemoglobin 8.4 (*)    HCT 23.9 (*)    RDW 19.8 (*)    All other components within normal limits  BRAIN NATRIURETIC PEPTIDE - Abnormal; Notable for the following components:   B Natriuretic Peptide 3,672.9 (*)    All other components within normal limits  MAGNESIUM    EKG   EKG Interpretation  Date/Time:  Monday Nov 12 2019 22:48:23 EDT Ventricular Rate:  89 PR Interval:    QRS Duration: 169 QT Interval:  491 QTC Calculation: 598 R Axis:   -96 Text Interpretation: Sinus rhythm Borderline short PR interval Nonspecific  IVCD with LAD Consider left ventricular hypertrophy Abnormal T, consider ischemia, lateral leads, improved from prior Reconfirmed by Thayer Jew ([id number]) on 11/13/2019 2:16:53 AM       Radiology DG Chest Port 1 View  Result Date: 11/12/2019 CLINICAL DATA:  Tachycardia EXAM: PORTABLE CHEST 1 VIEW COMPARISON:  10/21/2019 FINDINGS: Cardiomegaly with small right-sided pleural effusion. Vascular congestion. Diffuse bilateral ground-glass opacity likely pulmonary edema. No pneumothorax. Aortic atherosclerosis and carotid vascular calcification. IMPRESSION: 1. Cardiomegaly with vascular congestion and mild pulmonary edema. 2. Small right-sided pleural effusion, new or increased compared to 10/21/2019. Electronically Signed   By: Donavan Foil M.D.   On: 11/12/2019 23:28    Procedures Procedures (including critical care time)  Medications Ordered in ED Medications - No data to display  ED Course  I have reviewed the triage vital signs and the nursing notes.  Pertinent labs & imaging results that were available during my care of the patient were reviewed by me and considered in my medical decision making (see chart for details).    MDM Rules/Calculators/A&P                       Patient presents essentially with palpitations.  Had no chest pain or shortness of breath.  Tachycardia resolved prior to my evaluation and prior to her EKG.  He has no acute ischemic changes.  No arrhythmia.  He does have a history of atrial fibrillation and reports compliance with medications.  He had all but 1 hour of dialysis yesterday.  Will obtain metabolites.  He does have some evidence of volume overload and will obtain BNP and chest x-ray as well although he is not hypoxic or in any respiratory distress.  BMP shows a K of 5.2 which is improved from prior of .6.1  T wave changes improved on EKG.  Will give LoKelma given not due for dialysis for another 36hrs.  Creatinine is 5.39.  His EKG is unchanged from prior.   Do not feel acute hyperkalemia is the cause of his palpitations.  BNP is elevated and he has some evidence of volume overload on his chest x-ray.  His O2 sats are 94 to 95% and he is in no respiratory distress.  Feel it  likely that he went into atrial fibrillation which aborted prior to my evaluation.  This may have pushed him into some slight pulmonary edema and volume overload given that he recently did not finish a full dialysis session.  However, given that he is in no acute distress and not requiring any O2, do not feel he needs emergent dialysis.  I stressed with the patient that it is important for him to finish full dialysis sessions to prevent volume overload and significant hyperkalemia.  Do not feel he has indications for admission at this time.  Recommend follow-up with dialysis as scheduled.  After history, exam, and medical workup I feel the patient has been appropriately medically screened and is safe for discharge home. Pertinent diagnoses were discussed with the patient. Patient was given return precautions.   Final Clinical Impression(s) / ED Diagnoses Final diagnoses:  Palpitations  Tachycardia    Rx / DC Orders ED Discharge Orders         Ordered    Amb referral to AFIB Clinic     11/12/19 2314           Kynli Chou, Barbette Hair, MD 11/13/19 3662    Merryl Hacker, MD 11/13/19 [id number]

## 2019-11-12 NOTE — ED Notes (Signed)
Provider bedside.

## 2019-11-12 NOTE — ED Triage Notes (Signed)
Pt bib gems from home for tachycardia HR 165. EMS gave 100 amnio in route. Denies CP, SOB. Pt goes to dialysis MWF, has been going as usual. EMS pressure 95/40 after amnio

## 2019-11-13 ENCOUNTER — Ambulatory Visit (HOSPITAL_COMMUNITY): Payer: Medicare Other

## 2019-11-13 ENCOUNTER — Encounter (HOSPITAL_COMMUNITY): Payer: Self-pay

## 2019-11-13 DIAGNOSIS — R002 Palpitations: Secondary | ICD-10-CM | POA: Diagnosis not present

## 2019-11-13 LAB — CBC
HCT: 23.9 % — ABNORMAL LOW (ref 39.0–52.0)
Hemoglobin: 8.4 g/dL — ABNORMAL LOW (ref 13.0–17.0)
MCH: 29.8 pg (ref 26.0–34.0)
MCHC: 35.1 g/dL (ref 30.0–36.0)
MCV: 84.8 fL (ref 80.0–100.0)
Platelets: 199 10*3/uL (ref 150–400)
RBC: 2.82 MIL/uL — ABNORMAL LOW (ref 4.22–5.81)
RDW: 19.8 % — ABNORMAL HIGH (ref 11.5–15.5)
WBC: 6.6 10*3/uL (ref 4.0–10.5)
nRBC: 0 % (ref 0.0–0.2)

## 2019-11-13 LAB — BASIC METABOLIC PANEL
Anion gap: 13 (ref 5–15)
BUN: 33 mg/dL — ABNORMAL HIGH (ref 6–20)
CO2: 29 mmol/L (ref 22–32)
Calcium: 8.8 mg/dL — ABNORMAL LOW (ref 8.9–10.3)
Chloride: 95 mmol/L — ABNORMAL LOW (ref 98–111)
Creatinine, Ser: 5.39 mg/dL — ABNORMAL HIGH (ref 0.61–1.24)
GFR calc Af Amer: 13 mL/min — ABNORMAL LOW (ref >60)
GFR calc non Af Amer: 11 mL/min — ABNORMAL LOW (ref >60)
Glucose, Bld: 124 mg/dL — ABNORMAL HIGH (ref 70–99)
Potassium: 5.2 mmol/L — ABNORMAL HIGH (ref 3.5–5.1)
Sodium: 137 mmol/L (ref 135–145)

## 2019-11-13 LAB — BRAIN NATRIURETIC PEPTIDE: B Natriuretic Peptide: 3672.9 pg/mL — ABNORMAL HIGH (ref 0.0–100.0)

## 2019-11-13 LAB — MAGNESIUM: Magnesium: 1.9 mg/dL (ref 1.7–2.4)

## 2019-11-13 MED ORDER — SODIUM ZIRCONIUM CYCLOSILICATE 5 G PO PACK
5.0000 g | PACK | Freq: Once | ORAL | Status: AC
  Administered 2019-11-13: 03:00:00 5 g via ORAL
  Filled 2019-11-13: qty 1

## 2019-11-13 NOTE — Discharge Instructions (Addendum)
You were seen today for palpitations.  Your heart rate was very elevated for EMS.  This improved upon your arrival.  You do have some evidence of slight volume overload with some fluid on your lungs.  However, you are not requiring oxygen.  It is very important that you complete full dialysis sessions in order to prevent volume overload and hyperkalemia.

## 2019-11-14 ENCOUNTER — Emergency Department (HOSPITAL_COMMUNITY): Payer: Medicare Other

## 2019-11-14 ENCOUNTER — Emergency Department (HOSPITAL_COMMUNITY)
Admission: EM | Admit: 2019-11-14 | Discharge: 2019-11-14 | Discharge status: Against Medical Advice | Payer: Medicare Other | Attending: Emergency Medicine | Admitting: Emergency Medicine

## 2019-11-14 DIAGNOSIS — I120 Hypertensive chronic kidney disease with stage 5 chronic kidney disease or end stage renal disease: Secondary | ICD-10-CM | POA: Insufficient documentation

## 2019-11-14 DIAGNOSIS — Z79899 Other long term (current) drug therapy: Secondary | ICD-10-CM | POA: Diagnosis not present

## 2019-11-14 DIAGNOSIS — E875 Hyperkalemia: Secondary | ICD-10-CM

## 2019-11-14 DIAGNOSIS — R002 Palpitations: Secondary | ICD-10-CM | POA: Diagnosis not present

## 2019-11-14 DIAGNOSIS — R0602 Shortness of breath: Secondary | ICD-10-CM | POA: Diagnosis present

## 2019-11-14 DIAGNOSIS — Z992 Dependence on renal dialysis: Secondary | ICD-10-CM | POA: Diagnosis not present

## 2019-11-14 DIAGNOSIS — F17210 Nicotine dependence, cigarettes, uncomplicated: Secondary | ICD-10-CM | POA: Insufficient documentation

## 2019-11-14 DIAGNOSIS — I2510 Atherosclerotic heart disease of native coronary artery without angina pectoris: Secondary | ICD-10-CM | POA: Insufficient documentation

## 2019-11-14 DIAGNOSIS — J449 Chronic obstructive pulmonary disease, unspecified: Secondary | ICD-10-CM | POA: Insufficient documentation

## 2019-11-14 DIAGNOSIS — Z955 Presence of coronary angioplasty implant and graft: Secondary | ICD-10-CM | POA: Insufficient documentation

## 2019-11-14 DIAGNOSIS — N186 End stage renal disease: Secondary | ICD-10-CM

## 2019-11-14 LAB — CBC
HCT: 29.5 % — ABNORMAL LOW (ref 39.0–52.0)
Hemoglobin: 9.9 g/dL — ABNORMAL LOW (ref 13.0–17.0)
MCH: 28.4 pg (ref 26.0–34.0)
MCHC: 33.6 g/dL (ref 30.0–36.0)
MCV: 84.5 fL (ref 80.0–100.0)
Platelets: 314 10*3/uL (ref 150–400)
RBC: 3.49 MIL/uL — ABNORMAL LOW (ref 4.22–5.81)
RDW: 20.4 % — ABNORMAL HIGH (ref 11.5–15.5)
WBC: 8.8 10*3/uL (ref 4.0–10.5)
nRBC: 0 % (ref 0.0–0.2)

## 2019-11-14 LAB — BASIC METABOLIC PANEL
Anion gap: 18 — ABNORMAL HIGH (ref 5–15)
BUN: 46 mg/dL — ABNORMAL HIGH (ref 6–20)
CO2: 24 mmol/L (ref 22–32)
Calcium: 9.7 mg/dL (ref 8.9–10.3)
Chloride: 95 mmol/L — ABNORMAL LOW (ref 98–111)
Creatinine, Ser: 7.1 mg/dL — ABNORMAL HIGH (ref 0.61–1.24)
GFR calc Af Amer: 9 mL/min — ABNORMAL LOW (ref >60)
GFR calc non Af Amer: 8 mL/min — ABNORMAL LOW (ref >60)
Glucose, Bld: 128 mg/dL — ABNORMAL HIGH (ref 70–99)
Potassium: 6.4 mmol/L (ref 3.5–5.1)
Sodium: 137 mmol/L (ref 135–145)

## 2019-11-14 LAB — TROPONIN I (HIGH SENSITIVITY): Troponin I (High Sensitivity): 41 ng/L — ABNORMAL HIGH (ref <18)

## 2019-11-14 MED ORDER — SODIUM CHLORIDE 0.9% FLUSH: 3.0000 mL | Freq: Once | INTRAVENOUS | Status: DC

## 2019-11-14 NOTE — Discharge Instructions (Signed)
I recommended that you needed urgent dialysis.  You told us that you did not want to stay in the hospital for dialysis.   You should go immediately to your dialysis center TODAY for dialysis.  They can fit you in before 2:30 pm.  If you miss dialysis, or cannot get it done today, you need to come back to the ER.  Your potassium level was dangerously high, and this can lead to heart attack and arrhythmias.  If you start having palpitations or difficulty breathing, you should come back to the ER.

## 2019-11-14 NOTE — ED Provider Notes (Signed)
[facility] EMERGENCY DEPARTMENT Provider Note   CSN: [id number] Arrival date & time: 11/14/19  1048     History Chief Complaint  Patient presents with  . Shortness of Breath    [name] is a 59 y.o. male w/ a complicated medical history, including ESRD on dialysis MWF, arrhythmias, p Afib, liver cirrhosis with ascites, chronic hep B and C, chronic pain, HTN, presenting to the ED with shortness of breath and myalgia.  Patient reporting palpitations this morning.  SOB lying down.  He was due for dialysis today but has not gone.  He tells me he hasn't missed any others.  He states he took 2 pills for his palpitation, "the 240 mg one and the 200 mg one," just before EMS arrived, and feels his symptoms have already improved.  He cannot name the medications to me.   He unfortunately has a very long and documented history of medical noncompliance and missed dialysis.  He was last hospitalized in April 2021 for abdominal pain and ascites.  He had been seen in the ED several times following this for recurrent abdominal pain, and had a palliative home care visit on 10/31/19, re-iterating his DNR status.  He was set up with home health.  He was seen in the Ed on 09/25/19 for wide complex tachycardia, and noted by Dr Einar Gip in consultation that the patient has been quite compliant with his medications.  HPI     Past Medical History:  Diagnosis Date  . Anemia   . Anxiety   . Arthritis    left shoulder  . Atherosclerosis of aorta ([place])   . Cardiomegaly   . Chest pain    DATE UNKNOWN, C/O PERIODICALLY  . Cocaine abuse ([place])   . COPD exacerbation ([place]) 08/17/2016  . Coronary artery disease    stent 02/22/17  . ESRD (end stage renal disease) on dialysis ([place])    "[name]; MWF" (07/04/2017)  . GERD (gastroesophageal reflux disease)    DATE UNKNOWN  . Hemorrhoids   . Hepatitis B, chronic ([place])   . Hepatitis C   . History of kidney stones   . Hyperkalemia   .  Hypertension   . Kidney failure   . Metabolic bone disease    Patient denies  . Mitral stenosis   . Myocardial infarction ([place])   . Pneumonia   . Pulmonary edema   . Solitary rectal ulcer syndrome 07/2017   at flex [facility] for rectal bleeding  . Tubular adenoma of colon     Patient Active Problem List   Diagnosis Date Noted  . Acetaminophen overdose 10/27/2019  . ESRD (end stage renal disease) ([place])   . Goals of care, counseling/discussion   . Hypoxia 10/04/2019  . Advanced care planning/counseling discussion   . Renal failure 09/26/2019  . Dyspnea 06/09/2019  . Acquired thrombophilia ([place]) 06/05/2019  . A-fib ([place]) 05/30/2019  . Atrial fibrillation with RVR ([place]) 05/29/2019  . Melena   . Pressure injury of skin 03/09/2019  . Abdominal distention   . Volume overload 12/28/2018  . Sepsis ([place]) 09/12/2018  . Atherosclerosis of native coronary artery of native heart without angina pectoris 03/11/2018  . Benign neoplasm of cecum   . Benign neoplasm of ascending colon   . Benign neoplasm of descending colon   . Benign neoplasm of rectum   . Paroxysmal atrial fibrillation ([place]) 01/23/2018  . Hx of colonic polyps 01/20/2018  . End-stage renal disease on hemodialysis ([place]) 11/21/2017  .  GERD (gastroesophageal reflux disease) 11/16/2017  . Decompensated hepatic cirrhosis ([place]) 11/15/2017  . DNR (do not resuscitate)   . Palliative care by specialist   . Hyponatremia 11/04/2017  . SBP (spontaneous bacterial peritonitis) ([place]) 10/30/2017  . Liver disease, chronic 10/30/2017  . SOB (shortness of breath)   . Abdominal pain 10/28/2017  . Upper airway cough syndrome with flattening on f/v loop 10/13/17 c/w vcd 10/17/2017  . Elevated diaphragm 10/13/2017  . Ileus ([place]) 09/29/2017  . QT prolongation 09/29/2017  . Malnutrition of moderate degree 09/29/2017  . Sinus congestion 09/03/2017  . Symptomatic anemia 09/02/2017  . Other cirrhosis of liver ([place]) 09/02/2017  . Left bundle branch  block 09/02/2017  . Mitral stenosis 09/02/2017  . Hematochezia 07/15/2017  . Wide-complex tachycardia ([place])   . Endotracheally intubated   . ESRD on dialysis ([place]) 07/04/2017  . Acute respiratory failure with hypoxia ([place]) 06/18/2017  . CKD (chronic kidney disease) stage V requiring chronic dialysis ([place]) 06/18/2017  . History of Cocaine abuse ([place]) 06/18/2017  . Hypertension 06/18/2017  . Infection of AV graft for dialysis ([place]) 06/18/2017  . Anxiety 06/18/2017  . Anemia due to chronic kidney disease 06/18/2017  . Atypical atrial flutter ([place]) 06/18/2017  . Personality disorder ([place]) 06/13/2017  . Cellulitis 06/12/2017  . Adjustment disorder with mixed anxiety and depressed mood 06/10/2017  . Suicidal ideation 06/10/2017  . Arm wound, left, sequela 06/10/2017  . Dyspnea on exertion 05/29/2017  . Tachycardia 05/29/2017  . Hyperkalemia 05/22/2017  . Acute metabolic encephalopathy   . Anemia 04/23/2017  . Ascites 04/23/2017  . COPD (chronic obstructive pulmonary disease) ([name]) 04/23/2017  . Acute on chronic respiratory failure with hypoxia ([place]) 03/25/2017  . Arrhythmia 03/25/2017  . COPD GOLD 0 with flattening on inps f/v  09/27/2016  . Essential hypertension 09/27/2016  . Fluid overload 08/30/2016  . COPD exacerbation ([place]) 08/17/2016  . Hypertensive urgency 08/17/2016  . Respiratory failure ([place]) 08/17/2016  . Problem with dialysis access ([place]) 07/23/2016  . Chronic hepatitis B ([place]) 03/05/2014  . Chronic hepatitis C without hepatic coma ([place]) 03/05/2014  . Internal hemorrhoids with bleeding, swelling and itching 03/05/2014  . Thrombocytopenia ([place]) 03/05/2014  . Chest pain 02/27/2014  . Alcohol abuse 04/14/2009  . Cigarette smoker 04/14/2009  . GANGLION CYST 04/14/2009    Past Surgical History:  Procedure Laterality Date  . A/V FISTULAGRAM Left 05/26/2017   Procedure: A/V FISTULAGRAM;  Surgeon: Conrad J[place], MD;  Location: [facility];  Service:  Cardiovascular;  Laterality: Left;  . A/V FISTULAGRAM Right 11/18/2017   Procedure: A/V FISTULAGRAM - Right Arm;  Surgeon: Elam Dutch, MD;  Location: [facility];  Service: Cardiovascular;  Laterality: Right;  . APPLICATION OF WOUND VAC Left 06/14/2017   Procedure: APPLICATION OF WOUND VAC;  Surgeon: Katha Cabal, MD;  Location: [facility] ORS;  Service: Vascular;  Laterality: Left;  . AV FISTULA PLACEMENT  2012   BELIEVED WAS PLACED IN JUNE  . AV FISTULA PLACEMENT Right 08/09/2017   Procedure: Creation Right arm ARTERIOVENOUS BRACHIOCEPOHALIC FISTULA;  Surgeon: Elam Dutch, MD;  Location[facility] OR;  Service: Vascular;  Laterality: Right;  . AV FISTULA PLACEMENT Right 11/22/2017   Procedure: INSERTION OF ARTERIOVENOUS (AV) GORE-TEX GRAFT RIGHT UPPER ARM;  Surgeon: Elam Dutch, MD;  Location: [facility];  Service: Vascular;  Laterality: Right;  . BIOPSY  01/25/2018   Procedure: BIOPSY;  Surgeon: Jerene Bears, MD;  Location: [facility] ENDOSCOPY;  Service: Gastroenterology;;  .  BIOPSY  04/10/2019   Procedure: BIOPSY;  Surgeon: Jerene Bears, MD;  Location: [facility] ENDOSCOPY;  Service: Gastroenterology;;  . COLONOSCOPY    . COLONOSCOPY WITH PROPOFOL N/A 01/25/2018   Procedure: COLONOSCOPY WITH PROPOFOL;  Surgeon: Jerene Bears, MD;  Location: [facility];  Service: Gastroenterology;  Laterality: N/A;  . CORONARY STENT INTERVENTION N/A 02/22/2017   Procedure: CORONARY STENT INTERVENTION;  Surgeon: Nigel Mormon, MD;  Location: [facility];  Service: Cardiovascular;  Laterality: N/A;  . ESOPHAGOGASTRODUODENOSCOPY (EGD) WITH PROPOFOL N/A 01/25/2018   Procedure: ESOPHAGOGASTRODUODENOSCOPY (EGD) WITH PROPOFOL;  Surgeon: Jerene Bears, MD;  Location: [facility];  Service: Gastroenterology;  Laterality: N/A;  . ESOPHAGOGASTRODUODENOSCOPY (EGD) WITH PROPOFOL N/A 04/10/2019   Procedure: ESOPHAGOGASTRODUODENOSCOPY (EGD) WITH PROPOFOL;  Surgeon: Jerene Bears, MD;  Location: [facility];   Service: Gastroenterology;  Laterality: N/A;  . FLEXIBLE SIGMOIDOSCOPY N/A 07/15/2017   Procedure: FLEXIBLE SIGMOIDOSCOPY;  Surgeon: Carol Ada, MD;  Location: [facility];  Service: Endoscopy;  Laterality: N/A;  . HEMORRHOID BANDING    . I & D EXTREMITY Left 06/01/2017   Procedure: IRRIGATION AND DEBRIDEMENT LEFT ARM HEMATOMA WITH LIGATION OF LEFT ARM AV FISTULA;  Surgeon: Elam Dutch, MD;  Location: [facility];  Service: Vascular;  Laterality: Left;  . I & D EXTREMITY Left 06/14/2017   Procedure: IRRIGATION AND DEBRIDEMENT EXTREMITY;  Surgeon: Katha Cabal, MD;  Location: [facility] ORS;  Service: Vascular;  Laterality: Left;  . INSERTION OF DIALYSIS CATHETER  05/30/2017  . INSERTION OF DIALYSIS CATHETER N/A 05/30/2017   Procedure: INSERTION OF DIALYSIS CATHETER;  Surgeon: Elam Dutch, MD;  Location: [facility];  Service: Vascular;  Laterality: N/A;  . IR PARACENTESIS  08/30/2017  . IR PARACENTESIS  09/29/2017  . IR PARACENTESIS  10/28/2017  . IR PARACENTESIS  11/09/2017  . IR PARACENTESIS  11/16/2017  . IR PARACENTESIS  11/28/2017  . IR PARACENTESIS  12/01/2017  . IR PARACENTESIS  12/06/2017  . IR PARACENTESIS  01/03/2018  . IR PARACENTESIS  01/23/2018  . IR PARACENTESIS  02/07/2018  . IR PARACENTESIS  02/21/2018  . IR PARACENTESIS  03/06/2018  . IR PARACENTESIS  03/17/2018  . IR PARACENTESIS  04/04/2018  . IR PARACENTESIS  12/28/2018  . IR PARACENTESIS  01/08/2019  . IR PARACENTESIS  01/23/2019  . IR PARACENTESIS  02/01/2019  . IR PARACENTESIS  02/19/2019  . IR PARACENTESIS  03/01/2019  . IR PARACENTESIS  03/15/2019  . IR PARACENTESIS  04/03/2019  . IR PARACENTESIS  04/12/2019  . IR PARACENTESIS  05/01/2019  . IR PARACENTESIS  05/08/2019  . IR PARACENTESIS  05/24/2019  . IR PARACENTESIS  06/12/2019  . IR PARACENTESIS  07/09/2019  . IR PARACENTESIS  07/27/2019  . IR PARACENTESIS  08/09/2019  . IR PARACENTESIS  08/21/2019  . IR PARACENTESIS  09/17/2019  . IR PARACENTESIS  10/05/2019  . IR PARACENTESIS   10/29/2019  . IR PARACENTESIS  11/08/2019  . IR RADIOLOGIST EVAL & MGMT  02/14/2018  . IR RADIOLOGIST EVAL & MGMT  02/22/2019  . LEFT HEART CATH AND CORONARY ANGIOGRAPHY N/A 02/22/2017   Procedure: LEFT HEART CATH AND CORONARY ANGIOGRAPHY;  Surgeon: Nigel Mormon, MD;  Location: [facility];  Service: Cardiovascular;  Laterality: N/A;  . LIGATION OF ARTERIOVENOUS  FISTULA Left 12/11/6071   Procedure: Plication of Left Arm Arteriovenous Fistula;  Surgeon: Elam Dutch, MD;  Location: [facility];  Service: Vascular;  Laterality: Left;  . POLYPECTOMY    .  POLYPECTOMY  01/25/2018   Procedure: POLYPECTOMY;  Surgeon: Jerene Bears, MD;  Location: [facility];  Service: Gastroenterology;;  . REVISON OF ARTERIOVENOUS FISTULA Left 03/12/5175   Procedure: PLICATION OF DISTAL ANEURYSMAL SEGEMENT OF LEFT UPPER ARM ARTERIOVENOUS FISTULA;  Surgeon: Elam Dutch, MD;  Location: [facility];  Service: Vascular;  Laterality: Left;  . REVISON OF ARTERIOVENOUS FISTULA Left 1/60/7371   Procedure: Plication of Left Upper Arm Fistula ;  Surgeon: Waynetta Sandy, MD;  Location: [facility];  Service: Vascular;  Laterality: Left;  . SKIN GRAFT SPLIT THICKNESS LEG / FOOT Left    SKIN GRAFT SPLIT THICKNESS LEFT ARM DONOR SITE: LEFT ANTERIOR THIGH  . SKIN SPLIT GRAFT Left 07/04/2017   Procedure: SKIN GRAFT SPLIT THICKNESS LEFT ARM DONOR SITE: LEFT ANTERIOR THIGH;  Surgeon: Elam Dutch, MD;  Location: [facility];  Service: Vascular;  Laterality: Left;  . THROMBECTOMY W/ EMBOLECTOMY Left 06/05/2017   Procedure: EXPLORATION OF LEFT ARM FOR BLEEDING; OVERSEWED PROXIMAL FISTULA;  Surgeon: Angelia Mould, MD;  Location: [facility];  Service: Vascular;  Laterality: Left;  . WOUND EXPLORATION Left 06/03/2017   Procedure: WOUND EXPLORATION WITH WOUND VAC APPLICATION TO LEFT ARM;  Surgeon: Angelia Mould, MD;  Location: [facility] OR;  Service: Vascular;  Laterality: Left;       Family History  Problem Relation Age  of Onset  . Heart disease Mother   . Lung cancer Mother   . Heart disease Father   . Malignant hyperthermia Father   . COPD Father   . Throat cancer Sister   . Esophageal cancer Sister   . Hypertension Other   . COPD Other   . Colon cancer Neg Hx   . Colon polyps Neg Hx   . Rectal cancer Neg Hx   . Stomach cancer Neg Hx     Social History   Tobacco Use  . Smoking status: Current Every Day Smoker    Packs/day: 0.50    Years: 43.00    Pack years: 21.50    Types: Cigarettes    Start date: 08/13/1973  . Smokeless tobacco: Never Used  . Tobacco comment: i dont know i just make   Substance Use Topics  . Alcohol use: Not Currently    Comment: quit drinking in 2017  . Drug use: Not Currently    Types: Marijuana, Cocaine    Comment: reports using once every 3 months, 04-06-2019 was this     Home Medications Prior to Admission medications   Medication Sig Start Date End Date Taking? Authorizing Provider  albuterol (VENTOLIN HFA) 108 (90 Base) MCG/ACT inhaler Inhale 2 puffs into the lungs every 6 (six) hours as needed for wheezing or shortness of breath.    [provider]  budesonide-formoterol (SYMBICORT) 160-4.5 MCG/ACT inhaler Inhale 2 puffs into the lungs 2 (two) times daily.    [provider]  diltiazem (CARDIZEM CD) 240 MG 24 hr capsule TAKE 1 CAPSULE BY MOUTH EVERY DAY Patient taking differently: Take 240 mg by mouth daily.  11/05/19   Adrian Prows, MD  hydrALAZINE (APRESOLINE) 100 MG tablet Take 100 mg by mouth 2 (two) times daily.    [provider]  hydrOXYzine (ATARAX/VISTARIL) 25 MG tablet Take 25 mg by mouth 2 (two) times daily as needed for itching.  08/21/19   [provider]  lactulose (CHRONULAC) 10 GM/15ML solution Take 45 mLs (30 g total) by mouth daily as needed for mild constipation. 10/01/19   Lala Lund  K, MD  lidocaine (LIDODERM) 5 % Place 1 patch onto the skin daily. Remove & Discard patch within 12 hours or as directed by  MD 10/28/19   McDonald, Mia A, PA-C  metoprolol tartrate (LOPRESSOR) 100 MG tablet Take 1 tablet (100 mg total) by mouth 2 (two) times daily. 06/04/19   Al Decant, MD  montelukast (SINGULAIR) 10 MG tablet Take 10 mg by mouth every evening.     [provider]  ondansetron (ZOFRAN) 4 MG tablet Take 4 mg by mouth every 4 (four) hours as needed for nausea or vomiting. 09/18/19   [provider]  Oxycodone HCl 10 MG TABS Take 10 mg by mouth 3 (three) times daily as needed (pain).  08/25/19   [provider]  pantoprazole (PROTONIX) 40 MG tablet Take 40 mg by mouth daily.    [provider]  sevelamer carbonate (RENVELA) 800 MG tablet Take 1,600-3,200 mg by mouth See admin instructions. Take 4 tablets (3200 mg) by mouth 3 times daily with meals and 2 tablets (1600 mg) with snacks    [provider]  zolpidem (AMBIEN) 10 MG tablet Take 10 mg by mouth at bedtime as needed for sleep.    [provider]    Allergies    Tramadol, Morphine and related, Pollen extract, Acetaminophen, Aspirin, and Clonidine derivatives  Review of Systems   Review of Systems  Constitutional: Negative for chills and fever.  HENT: Negative for ear pain and sore throat.   Eyes: Negative for pain and visual disturbance.  Respiratory: Positive for shortness of breath. Negative for cough.   Cardiovascular: Positive for chest pain and palpitations.  Gastrointestinal: Positive for abdominal pain and nausea.  Musculoskeletal: Positive for arthralgias and myalgias.  Skin: Negative for color change and rash.  Neurological: Positive for light-headedness and headaches. Negative for syncope.  Psychiatric/Behavioral: Negative for agitation and confusion.  All other systems reviewed and are negative.   Physical Exam Updated Vital Signs BP 114/65   Pulse (!) 46   Resp 17   SpO2 100%   Physical Exam Vitals and nursing note reviewed.  Constitutional:      Appearance: He is  well-developed.  HENT:     Head: Normocephalic and atraumatic.  Eyes:     Conjunctiva/sclera: Conjunctivae normal.  Cardiovascular:     Rate and Rhythm: Tachycardia present. Rhythm irregular.     Heart sounds: No murmur.  Pulmonary:     Effort: Pulmonary effort is normal.     Breath sounds: Normal breath sounds.  Abdominal:     Palpations: Abdomen is soft.     Tenderness: There is no abdominal tenderness.     Comments: Distension, fluid wave  Musculoskeletal:     Cervical back: Neck supple.  Skin:    General: Skin is warm and dry.  Neurological:     General: No focal deficit present.     Mental Status: He is alert and oriented to person, place, and time.     ED Results / Procedures / Treatments   Labs (all labs ordered are listed, but only abnormal results are displayed) Labs Reviewed  BASIC METABOLIC PANEL - Abnormal; Notable for the following components:      Result Value   Potassium 6.4 (*)    Chloride 95 (*)    Glucose, Bld 128 (*)    BUN 46 (*)    Creatinine, Ser 7.10 (*)    GFR calc non Af Amer 8 (*)    GFR calc  Af Amer 9 (*)    Anion gap 18 (*)    All other components within normal limits  CBC - Abnormal; Notable for the following components:   RBC 3.49 (*)    Hemoglobin 9.9 (*)    HCT 29.5 (*)    RDW 20.4 (*)    All other components within normal limits  TROPONIN I (HIGH SENSITIVITY) - Abnormal; Notable for the following components:   Troponin I (High Sensitivity) 41 (*)    All other components within normal limits  RESPIRATORY PANEL BY RT PCR (FLU A&B, COVID)  TROPONIN I (HIGH SENSITIVITY)    EKG EKG Interpretation  Date/Time:  Wednesday Nov 14 2019 12:37:38 EDT Ventricular Rate:  112 PR Interval:    QRS Duration: 176 QT Interval:  429 QTC Calculation: 545 R Axis:   168 Text Interpretation: Age not entered, assumed to be  59 years old for purpose of ECG interpretation Sinus or ectopic atrial tachycardia Ventricular bigeminy Nonspecific  intraventricular conduction delay Minimal ST depression, inferior leads No STEMI Confirmed by Octaviano Glow [phone]) on 11/14/2019 1:19:39 PM   Radiology DG Chest Portable 1 View  Result Date: 11/14/2019 CLINICAL DATA:  Shortness of breath for 1 week. EXAM: PORTABLE CHEST 1 VIEW COMPARISON:  11/12/2019 FINDINGS: 1116 hours. Low lung volumes. The cardio pericardial silhouette is enlarged. Interstitial markings are diffusely coarsened with chronic features. Interval progression of right base collapse/consolidation with persistent small right pleural effusion. Lucency under the left hemidiaphragm likely related to large gastric bubble. Bones are diffusely demineralized. IMPRESSION: Interval progression of right base collapse/consolidation with persistent small right pleural effusion. Lucency under the left hemidiaphragm likely related to large gastric bubble. Intraperitoneal considered much less likely, but if there is clinical concern for bowel perforation, two-view abdomen could be used to further evaluate. Electronically Signed   By: Misty Stanley M.D.   On: 11/14/2019 11:32   DG Chest Port 1 View  Result Date: 11/12/2019 CLINICAL DATA:  Tachycardia EXAM: PORTABLE CHEST 1 VIEW COMPARISON:  10/21/2019 FINDINGS: Cardiomegaly with small right-sided pleural effusion. Vascular congestion. Diffuse bilateral ground-glass opacity likely pulmonary edema. No pneumothorax. Aortic atherosclerosis and carotid vascular calcification. IMPRESSION: 1. Cardiomegaly with vascular congestion and mild pulmonary edema. 2. Small right-sided pleural effusion, new or increased compared to 10/21/2019. Electronically Signed   By: Donavan Foil M.D.   On: 11/12/2019 23:28    Procedures Procedures (including critical care time)  Medications Ordered in ED Medications - No data to display  ED Course  I have reviewed the triage vital signs and the nursing notes.  Pertinent labs & imaging results that were available during my  care of the patient were reviewed by me and considered in my medical decision making (see chart for details).  59 yo male w/ complicated medical history as noted above, presenting with palpitations, found on arrival to be tachycardia with suspected SVT with aberrancy per my interpretation of his ecg.  His HR was 160 bpm initially in triage, improved to 85 bpm upon his arrival in the ED room.  His BP was stable.    It sounds like he may have taken cardizem 240 mg capsule prior to arrival ("the 240 mg tablet to slow my heart"), plus another rate controlling medice, possibly metoprolol, although he states "200 mg" and he is prescribed lopressor 100 mg BID.  He feels his palpitations improved.  He has multiple other complaints, namely abdominal pain, and is asking me for pain medications.  This appears to be  a chronic complaint per his medical history.  Labs were ordered in triage.  This was notable for hyperkalemia K+ 6.4, Cr 7.1.  Troponin 47, but near his baseline elevation with CKD.  Xray chest ordered, reviewed by myself, with right lower effusion.  I suspect this was a gastric bubble under the left hemidiaphragm, not a bowel perforation.  He does not have signs of peritonitis on my exam.  He is unfortunately quite sick with acute on chronic issues, including his tachyarrythmia and his electrolyte imbalance, likely some volume overload as well from missed dialysis.  I recommended that he get dialyzed and that we manage his hyperK, a potentially life-threatening condition, as well as monitor his heart rate.  He has chosen to leave instead, stating that he can get to his outpatient dialysis on time for his session today.  He refused an IV from nursing.  I was able to get on the phone with his dialysis center and confirm that they could fit him in today, and told him if he insisted on leaving he needs to go immediately to the center.  AMA  Clinical Course as of Nov 14 2031  Wed Nov 14, 2019  1224 Pt  assessed in triage, reporting his palpitations have improved, still feels SOB, missed dialysis today.  Awaiting him to be brought back to room, placed on tele. He took 2 pills this morning, "The 240 mg one and the 200 mg one," he does not recall their names   [MT]  1300 HR 120 bpm now   [MT]  1318 Patient reports he was to leave the hospital now go to his dialysis center.  Is able to speak to dialysis center on the phone I told me if the patient could arrive for 230 to be able to dialyze him.  I did explain to the patient that his potassium is high, that this can be life threatening, but he says "I can get dialysis faster there than sitting here all day. I want to leave."  He refused additional needle sticks from his nurses.   [MT]  1327 HR 110 on reassessment.  Unfortunately the patient has a long history of medical noncompliance, noncompliance with dialysis, and leaving AMA.  I suspect his overall prognosis is extremely poor.   [MT]    Clinical Course User Index [MT] Emoree Sasaki, Carola Rhine, MD    Final Clinical Impression(s) / ED Diagnoses Final diagnoses:  ESRD (end stage renal disease) ([place])  Hyperkalemia  Palpitations  Shortness of breath    Rx / DC Orders ED Discharge Orders    None       Wyvonnia Dusky, MD 11/14/19 2051

## 2019-11-14 NOTE — ED Notes (Signed)
Pt states he wants to have dialysis at his own center.  MD at bedside.

## 2019-11-14 NOTE — ED Triage Notes (Signed)
Pt arrives via pov with reports of shob onset today. HR 170 in triage. Pt placed on 2L [place] for comfort. Last dialysis Monday.

## 2019-11-14 NOTE — ED Notes (Signed)
Trifan MD made aware of abnormal ECG

## 2019-11-14 NOTE — ED Notes (Signed)
Pt insisting IV be placed in specific vein.  Unable to access IV.

## 2019-11-14 NOTE — ED Notes (Signed)
Date and time results received: 11/14/19    Test: Potassium Critical Value: 6.4  Name of Provider Notified: Dr. Langston Masker

## 2019-11-15 ENCOUNTER — Encounter (HOSPITAL_BASED_OUTPATIENT_CLINIC_OR_DEPARTMENT_OTHER): Payer: Medicare Other | Attending: Internal Medicine | Admitting: Internal Medicine

## 2019-11-15 ENCOUNTER — Other Ambulatory Visit: Payer: Self-pay

## 2019-11-15 DIAGNOSIS — Z602 Problems related to living alone: Secondary | ICD-10-CM | POA: Insufficient documentation

## 2019-11-15 DIAGNOSIS — K7460 Unspecified cirrhosis of liver: Secondary | ICD-10-CM | POA: Diagnosis not present

## 2019-11-15 DIAGNOSIS — Z992 Dependence on renal dialysis: Secondary | ICD-10-CM | POA: Diagnosis not present

## 2019-11-15 DIAGNOSIS — I120 Hypertensive chronic kidney disease with stage 5 chronic kidney disease or end stage renal disease: Secondary | ICD-10-CM | POA: Insufficient documentation

## 2019-11-15 DIAGNOSIS — I480 Paroxysmal atrial fibrillation: Secondary | ICD-10-CM | POA: Diagnosis not present

## 2019-11-15 DIAGNOSIS — J449 Chronic obstructive pulmonary disease, unspecified: Secondary | ICD-10-CM | POA: Diagnosis not present

## 2019-11-15 DIAGNOSIS — N186 End stage renal disease: Secondary | ICD-10-CM | POA: Insufficient documentation

## 2019-11-15 DIAGNOSIS — L89150 Pressure ulcer of sacral region, unstageable: Secondary | ICD-10-CM | POA: Diagnosis not present

## 2019-11-15 DIAGNOSIS — I2510 Atherosclerotic heart disease of native coronary artery without angina pectoris: Secondary | ICD-10-CM | POA: Insufficient documentation

## 2019-11-15 DIAGNOSIS — R188 Other ascites: Secondary | ICD-10-CM | POA: Insufficient documentation

## 2019-11-15 NOTE — Progress Notes (Signed)
[name] ([id number]) Visit Report for 11/15/2019 HPI Details Patient Name: Date of Service: [name] LD K. 11/15/2019 10:15 A M Medical Record Number: [id number] Patient Account Number: [id number] Date of Birth/Sex: Treating RN: [birth year] (59 y.o. [name] Primary Care Provider: Dustin Folks Other Clinician: Referring Provider: Treating Provider/Extender: Stefanie Libel, FRED Weeks in Treatment: 2 History of Present Illness HPI Description: ADMISSION 10/30/2019 This is a 59 year old man with multiple medical problems predominantly chronic renal failure on dialysis and cirrhosis with ascites secondary to hepatitis B C or potentially alcohol abuse. He has an unstageable wound on his sacrum. I think this was noted during one of his innumerable trips to the ER and he was sent here. He has been using Neosporin. He is completely unable to tell us any time frame for when this started. He says he thinks this was in the hospital but again is unsure. He has no pictures on his phone. He lives alone and does not really have a lot of help with topical dressings. Past medical history includes chronic renal failure on dialysis, exact etiology of his renal failure is not really clear. He has hepatitis B C paroxysmal atrial fibrillation cirrhosis with recurrent ascites requiring multiple paracenteses, substance abuse, left arm shunt infection problems, COPD, hypertension and coronary artery disease. 4/29; some improvement in the wound area. We have been using Medihoney 5/6; in general we are doing fairly well. He has been using Medihoney on this wound area. He has a family member helping him with the dressing change Electronic Signature(s) Signed: 11/15/2019 5:30:42 PM By: Linton Ham MD Entered By: Linton Ham on 11/15/2019 11:45:59 -------------------------------------------------------------------------------- Physical Exam Details Patient Name: Date of Service: Joshua  Diaz, Joshua LD K. 11/15/2019 10:15 A M Medical Record Number: [id number] Patient Account Number: [id number] Date of Birth/Sex: Treating RN: 1961/06/26 (59 y.o. [name] Primary Care Provider: Dustin Folks Other Clinician: Referring Provider: Treating Provider/Extender: Stefanie Libel, FRED Weeks in Treatment: 2 Constitutional Sitting or standing Blood Pressure is within target range for patient.. Pulse regular and within target range for patient.Marland Kitchen Respirations regular, non-labored and within target range.. Temperature is normal and within the target range for the patient.Marland Kitchen Appears in no distress. Respiratory work of breathing is normal. Integumentary (Hair, Skin) No erythema around the wound. Notes Wound exam; the areas on the lower sacrum somewhat a bit better surface. Debrided with Anasept and gauze. This did not require mechanical debridement. Surface area is better Electronic Signature(s) Signed: 11/15/2019 5:30:42 PM By: Linton Ham MD Entered By: Linton Ham on 11/15/2019 11:49:51 -------------------------------------------------------------------------------- Physician Orders Details Patient Name: Date of Service: [name], Joshua LD K. 11/15/2019 10:15 A M Medical Record Number: [id number] Patient Account Number: [id number] Date of Birth/Sex: Treating RN: April 27, 1961 (59 y.o. [name] Primary Care Provider: Dustin Folks Other Clinician: Referring Provider: Treating Provider/Extender: Stefanie Libel, FRED Weeks in Treatment: 2 Verbal / Phone Orders: No Diagnosis Coding ICD-10 Coding Code Description L89.150 Pressure ulcer of sacral region, unstageable K74.69 Other cirrhosis of liver Follow-up Appointments Return Appointment in 2 weeks. Dressing Change Frequency Change dressing every day. Wound Cleansing May shower and wash wound with soap and water. Primary Wound Dressing Wound #1 Sacrum Medihoney gel Secondary Dressing Foam Border - or  bordered gauze. Off-Loading Turn and reposition every 2 hours Electronic Signature(s) Signed: 11/15/2019 5:30:42 PM By: Linton Ham MD Signed: 11/15/2019 5:34:26 PM By: Deon Pilling Entered By: Deon Pilling on 11/15/2019 10:43:05 -------------------------------------------------------------------------------- Problem List Details Patient Name:  Date of Service: Joshua 24, Joshua LD K. 11/15/2019 10:15 A M Medical Record Number: [id number] Patient Account Number: [id number] Date of Birth/Sex: Treating RN: [birth year] (59 y.o. [name] Primary Care Provider: Dustin Folks Other Clinician: Referring Provider: Treating Provider/Extender: Stefanie Libel, FRED Weeks in Treatment: 2 Active Problems ICD-10 Encounter Code Description Active Date MDM Diagnosis L89.150 Pressure ulcer of sacral region, unstageable 10/30/2019 No Yes K74.69 Other cirrhosis of liver 10/30/2019 No Yes Inactive Problems Resolved Problems Electronic Signature(s) Signed: 11/15/2019 5:30:42 PM By: Linton Ham MD Entered By: Linton Ham on 11/15/2019 11:42:36 -------------------------------------------------------------------------------- Progress Note Details Patient Name: Date of Service: [name], Joshua LD K. 11/15/2019 10:15 A M Medical Record Number: [id number] Patient Account Number: [id number] Date of Birth/Sex: Treating RN: 1961-04-25 (59 y.o. [name] Primary Care Provider: Dustin Folks Other Clinician: Referring Provider: Treating Provider/Extender: Stefanie Libel, FRED Weeks in Treatment: 2 Subjective History of Present Illness (HPI) ADMISSION 10/30/2019 This is a 59 year old man with multiple medical problems predominantly chronic renal failure on dialysis and cirrhosis with ascites secondary to hepatitis B C or potentially alcohol abuse. He has an unstageable wound on his sacrum. I think this was noted during one of his innumerable trips to the ER and he was sent here. He  has been using Neosporin. He is completely unable to tell us any time frame for when this started. He says he thinks this was in the hospital but again is unsure. He has no pictures on his phone. He lives alone and does not really have a lot of help with topical dressings. Past medical history includes chronic renal failure on dialysis, exact etiology of his renal failure is not really clear. He has hepatitis B C paroxysmal atrial fibrillation cirrhosis with recurrent ascites requiring multiple paracenteses, substance abuse, left arm shunt infection problems, COPD, hypertension and coronary artery disease. 4/29; some improvement in the wound area. We have been using Medihoney 5/6; in general we are doing fairly well. He has been using Medihoney on this wound area. He has a family member helping him with the dressing change Objective Constitutional Sitting or standing Blood Pressure is within target range for patient.. Pulse regular and within target range for patient.Marland Kitchen Respirations regular, non-labored and within target range.. Temperature is normal and within the target range for the patient.Marland Kitchen Appears in no distress. Vitals Time Taken: 10:12 AM, Height: 69 in, Weight: 142 lbs, BMI: 21, Temperature: 97.6 F, Pulse: 101 bpm, Respiratory Rate: 18 breaths/min, Blood Pressure: 125/56 mmHg. Respiratory work of breathing is normal. General Notes: Wound exam; the areas on the lower sacrum somewhat a bit better surface. Debrided with Anasept and gauze. This did not require mechanical debridement. Surface area is better Integumentary (Hair, Skin) No erythema around the wound. Wound #1 status is Open. Original cause of wound was Pressure Injury. The wound is located on the Sacrum. The wound measures 2.2cm length x 1.8cm width x 0.1cm depth; 3.11cm^2 area and 0.311cm^3 volume. There is Fat Layer (Subcutaneous Tissue) Exposed exposed. There is no tunneling or undermining noted. There is a medium amount of  serosanguineous drainage noted. The wound margin is flat and intact. There is medium (34-66%) pink granulation within the wound bed. There is a medium (34-66%) amount of necrotic tissue within the wound bed including Adherent Slough. Assessment Active Problems ICD-10 Pressure ulcer of sacral region, unstageable Other cirrhosis of liver Plan Follow-up Appointments: Return Appointment in 2 weeks. Dressing Change Frequency: Change dressing every day. Wound Cleansing: May shower  and wash wound with soap and water. Primary Wound Dressing: Wound #1 Sacrum: Medihoney gel Secondary Dressing: Foam Border - or bordered gauze. Off-Loading: Turn and reposition every 2 hours 1. We are continuing using Medihoney covered with a border foam. He has family members changing this dressing 2. When he first came in I thought we are going to have to have a series of aggressive mechanical debridements although the Medihoney seems to have made this unnecessary 3. Follow-up in 2 weeks Electronic Signature(s) Signed: 11/15/2019 5:30:42 PM By: Linton Ham MD Entered By: Linton Ham on 11/15/2019 11:51:26 -------------------------------------------------------------------------------- SuperBill Details Patient Name: Date of Service: [name], Joshua LD K. 11/15/2019 Medical Record Number: [id number] Patient Account Number: [id number] Date of Birth/Sex: Treating RN: 05/18/1961 (59 y.o. [name] Primary Care Provider: Dustin Folks Other Clinician: Referring Provider: Treating Provider/Extender: Stefanie Libel, FRED Weeks in Treatment: 2 Diagnosis Coding ICD-10 Codes Code Description L89.150 Pressure ulcer of sacral region, unstageable K74.69 Other cirrhosis of liver Facility Procedures The patient participates with Medicare or their insurance follows the Medicare Facility Guidelines: CPT4 Code Description Modifier Quantity [id number] [place] VISIT-LEV 3 EST PT 1 Physician  Procedures : CPT4 Code Description Modifier [id number] - WC PHYS LEVEL 3 - EST PT ICD-10 Diagnosis Description L89.150 Pressure ulcer of sacral region, unstageable Quantity: 1 Electronic Signature(s) Signed: 11/15/2019 5:30:42 PM By: Linton Ham MD Entered By: Linton Ham on 11/15/2019 11:51:44

## 2019-11-20 ENCOUNTER — Ambulatory Visit (HOSPITAL_COMMUNITY): Admission: RE | Admit: 2019-11-20 | Payer: Medicare Other | Source: Ambulatory Visit

## 2019-11-20 ENCOUNTER — Ambulatory Visit (HOSPITAL_COMMUNITY): Payer: Medicare Other

## 2019-11-25 ENCOUNTER — Emergency Department (HOSPITAL_COMMUNITY)
Admission: EM | Admit: 2019-11-25 | Discharge: 2019-11-26 | Disposition: A | Discharge status: Home / Self Care | Payer: Medicare Other | Attending: Emergency Medicine | Admitting: Emergency Medicine

## 2019-11-25 ENCOUNTER — Other Ambulatory Visit: Payer: Self-pay

## 2019-11-25 ENCOUNTER — Emergency Department (HOSPITAL_COMMUNITY): Payer: Medicare Other

## 2019-11-25 DIAGNOSIS — I120 Hypertensive chronic kidney disease with stage 5 chronic kidney disease or end stage renal disease: Secondary | ICD-10-CM | POA: Insufficient documentation

## 2019-11-25 DIAGNOSIS — Z20822 Contact with and (suspected) exposure to covid-19: Secondary | ICD-10-CM | POA: Diagnosis not present

## 2019-11-25 DIAGNOSIS — N186 End stage renal disease: Secondary | ICD-10-CM | POA: Insufficient documentation

## 2019-11-25 DIAGNOSIS — J449 Chronic obstructive pulmonary disease, unspecified: Secondary | ICD-10-CM | POA: Diagnosis not present

## 2019-11-25 DIAGNOSIS — I4891 Unspecified atrial fibrillation: Secondary | ICD-10-CM | POA: Insufficient documentation

## 2019-11-25 DIAGNOSIS — Z992 Dependence on renal dialysis: Secondary | ICD-10-CM | POA: Insufficient documentation

## 2019-11-25 DIAGNOSIS — R0602 Shortness of breath: Secondary | ICD-10-CM | POA: Diagnosis present

## 2019-11-25 DIAGNOSIS — F17210 Nicotine dependence, cigarettes, uncomplicated: Secondary | ICD-10-CM | POA: Insufficient documentation

## 2019-11-25 DIAGNOSIS — Z9861 Coronary angioplasty status: Secondary | ICD-10-CM | POA: Insufficient documentation

## 2019-11-25 DIAGNOSIS — I2510 Atherosclerotic heart disease of native coronary artery without angina pectoris: Secondary | ICD-10-CM | POA: Diagnosis not present

## 2019-11-25 LAB — CBC
HCT: 28.7 % — ABNORMAL LOW (ref 39.0–52.0)
Hemoglobin: 9.5 g/dL — ABNORMAL LOW (ref 13.0–17.0)
MCH: 28.6 pg (ref 26.0–34.0)
MCHC: 33.1 g/dL (ref 30.0–36.0)
MCV: 86.4 fL (ref 80.0–100.0)
Platelets: 194 10*3/uL (ref 150–400)
RBC: 3.32 MIL/uL — ABNORMAL LOW (ref 4.22–5.81)
RDW: 18.6 % — ABNORMAL HIGH (ref 11.5–15.5)
WBC: 10.9 10*3/uL — ABNORMAL HIGH (ref 4.0–10.5)
nRBC: 0 % (ref 0.0–0.2)

## 2019-11-25 LAB — I-STAT CHEM 8, ED
BUN: 44 mg/dL — ABNORMAL HIGH (ref 6–20)
Calcium, Ion: 1.1 mmol/L — ABNORMAL LOW (ref 1.15–1.40)
Chloride: 85 mmol/L — ABNORMAL LOW (ref 98–111)
Creatinine, Ser: 5.1 mg/dL — ABNORMAL HIGH (ref 0.61–1.24)
Glucose, Bld: 554 mg/dL (ref 70–99)
HCT: 32 % — ABNORMAL LOW (ref 39.0–52.0)
Hemoglobin: 10.9 g/dL — ABNORMAL LOW (ref 13.0–17.0)
Potassium: 5 mmol/L (ref 3.5–5.1)
Sodium: 121 mmol/L — ABNORMAL LOW (ref 135–145)
TCO2: 30 mmol/L (ref 22–32)

## 2019-11-25 MED ORDER — DICLOFENAC SODIUM 1 % EX GEL
2.0000 g | Freq: Once | CUTANEOUS | Status: AC
  Administered 2019-11-26: 2 g via TOPICAL
  Filled 2019-11-25: qty 100

## 2019-11-25 MED ORDER — AMIODARONE HCL IN DEXTROSE 360-4.14 MG/200ML-% IV SOLN
INTRAVENOUS | Status: AC
  Filled 2019-11-25: qty 200

## 2019-11-25 MED ORDER — MELATONIN 3 MG PO TABS
3.0000 mg | ORAL_TABLET | Freq: Every evening | ORAL | Status: DC | PRN
  Administered 2019-11-26: 3 mg via ORAL
  Filled 2019-11-25: qty 1

## 2019-11-25 MED ORDER — AMIODARONE HCL IN DEXTROSE 360-4.14 MG/200ML-% IV SOLN
60.0000 mg/h | INTRAVENOUS | Status: DC
  Administered 2019-11-26: 60 mg/h via INTRAVENOUS

## 2019-11-25 NOTE — ED Notes (Signed)
Pt finished EMS's mixed bag of Amiodarone 150mg  upon arrival by verbal MD orders. No other Amiodarone orders placed at this time. Pt asymptomatic, stable, and on cell phone at this time. Still having runs of V Tach

## 2019-11-25 NOTE — ED Triage Notes (Signed)
Pt BIB in by [facility] after c/o of racing heart. Upon arrival to pt home, EMS reports pt was ShOB and with HR 170 and in sustained V-tach. PT received 6 mg adenosine, 12 mg adenosine, and 150 mg amiodarone. On arrival, PT HR 109, A & O x 4, O2 sat 100%, B/P 121/85 with runs of V-tach.

## 2019-11-25 NOTE — ED Provider Notes (Signed)
[facility] EMERGENCY DEPARTMENT Provider Note   CSN: [id number] Arrival date & time: 11/25/19  2242     History Chief Complaint  Patient presents with  . Stable V-Tach    [name] is a 59 y.o. male with a complicated medical history including ESRD on HD (M/W/F at [name]), paroxysmal atrial fibrillation, liver cirrhosis with ascites, chronic hepatitis B and C, chronic pain, hypertension, CAD, COPD, cocaine use disorder who presents the emergency department by EMS with a chief complaint of shortness of breath.  The patient endorses shortness of breath that began earlier tonight while he was laying flat.  He denies chest pain, cough, dizziness, lightheadedness, vomiting, leg swelling, numbness, weakness, palpitations, headache, abdominal pain, nausea, vomiting, or diarrhea.  EMS reports that the patient's heart rate was in the 170s and there was concern for V. tach on the monitor and patient pulses.  He was given 6 of adenosine followed by 12 of adenosine, and then 150 mg of amiodarone.  Heart rate was 109 on arrival with wide-complex QRS.  Patient reports that he was last dialyzed on Friday.  Reports that he completed an entire session.  He reports no complications with dialysis other than he was below his dry weight at the end of the session.  Reports that he took his home dose of amiodarone earlier tonight and states that he has been compliant with his home medications.  The history is provided by the patient and the EMS personnel. No language interpreter was used.       Past Medical History:  Diagnosis Date  . Anemia   . Anxiety   . Arthritis    left shoulder  . Atherosclerosis of aorta ([place])   . Cardiomegaly   . Chest pain    DATE UNKNOWN, C/O PERIODICALLY  . Cocaine abuse ([place])   . COPD exacerbation ([place]) 08/17/2016  . Coronary artery disease    stent 02/22/17  . ESRD (end stage renal disease) on dialysis ([place])    "[name]; MWF"  (07/04/2017)  . GERD (gastroesophageal reflux disease)    DATE UNKNOWN  . Hemorrhoids   . Hepatitis B, chronic ([place])   . Hepatitis C   . History of kidney stones   . Hyperkalemia   . Hypertension   . Kidney failure   . Metabolic bone disease    Patient denies  . Mitral stenosis   . Myocardial infarction ([place])   . Pneumonia   . Pulmonary edema   . Solitary rectal ulcer syndrome 07/2017   at flex [facility] for rectal bleeding  . Tubular adenoma of colon     Patient Active Problem List   Diagnosis Date Noted  . Acetaminophen overdose 10/27/2019  . ESRD (end stage renal disease) ([place])   . Goals of care, counseling/discussion   . Hypoxia 10/04/2019  . Advanced care planning/counseling discussion   . Renal failure 09/26/2019  . Dyspnea 06/09/2019  . Acquired thrombophilia ([place]) 06/05/2019  . A-fib ([place]) 05/30/2019  . Atrial fibrillation with RVR ([place]) 05/29/2019  . Melena   . Pressure injury of skin 03/09/2019  . Abdominal distention   . Volume overload 12/28/2018  . Sepsis ([place]) 09/12/2018  . Atherosclerosis of native coronary artery of native heart without angina pectoris 03/11/2018  . Benign neoplasm of cecum   . Benign neoplasm of ascending colon   . Benign neoplasm of descending colon   . Benign neoplasm of rectum   . Paroxysmal atrial fibrillation ([place]) 01/23/2018  .  Hx of colonic polyps 01/20/2018  . End-stage renal disease on hemodialysis ([place]) 11/21/2017  . GERD (gastroesophageal reflux disease) 11/16/2017  . Decompensated hepatic cirrhosis ([place]) 11/15/2017  . DNR (do not resuscitate)   . Palliative care by specialist   . Hyponatremia 11/04/2017  . SBP (spontaneous bacterial peritonitis) ([place]) 10/30/2017  . Liver disease, chronic 10/30/2017  . SOB (shortness of breath)   . Abdominal pain 10/28/2017  . Upper airway cough syndrome with flattening on f/v loop 10/13/17 c/w vcd 10/17/2017  . Elevated diaphragm 10/13/2017  . Ileus ([place]) 09/29/2017  . QT prolongation  09/29/2017  . Malnutrition of moderate degree 09/29/2017  . Sinus congestion 09/03/2017  . Symptomatic anemia 09/02/2017  . Other cirrhosis of liver ([place]) 09/02/2017  . Left bundle branch block 09/02/2017  . Mitral stenosis 09/02/2017  . Hematochezia 07/15/2017  . Wide-complex tachycardia ([place])   . Endotracheally intubated   . ESRD on dialysis ([place]) 07/04/2017  . Acute respiratory failure with hypoxia ([place]) 06/18/2017  . CKD (chronic kidney disease) stage V requiring chronic dialysis ([place]) 06/18/2017  . History of Cocaine abuse ([place]) 06/18/2017  . Hypertension 06/18/2017  . Infection of AV graft for dialysis ([place]) 06/18/2017  . Anxiety 06/18/2017  . Anemia due to chronic kidney disease 06/18/2017  . Atypical atrial flutter ([place]) 06/18/2017  . Personality disorder ([place]) 06/13/2017  . Cellulitis 06/12/2017  . Adjustment disorder with mixed anxiety and depressed mood 06/10/2017  . Suicidal ideation 06/10/2017  . Arm wound, left, sequela 06/10/2017  . Dyspnea on exertion 05/29/2017  . Tachycardia 05/29/2017  . Hyperkalemia 05/22/2017  . Acute metabolic encephalopathy   . Anemia 04/23/2017  . Ascites 04/23/2017  . COPD (chronic obstructive pulmonary disease) ([place]) 04/23/2017  . Acute on chronic respiratory failure with hypoxia ([place]) 03/25/2017  . Arrhythmia 03/25/2017  . COPD GOLD 0 with flattening on inps f/v  09/27/2016  . Essential hypertension 09/27/2016  . Fluid overload 08/30/2016  . COPD exacerbation ([place]) 08/17/2016  . Hypertensive urgency 08/17/2016  . Respiratory failure ([place]) 08/17/2016  . Problem with dialysis access ([place]) 07/23/2016  . Chronic hepatitis B ([place]) 03/05/2014  . Chronic hepatitis C without hepatic coma ([place]) 03/05/2014  . Internal hemorrhoids with bleeding, swelling and itching 03/05/2014  . Thrombocytopenia ([place]) 03/05/2014  . Chest pain 02/27/2014  . Alcohol abuse 04/14/2009  . Cigarette smoker 04/14/2009  . GANGLION CYST 04/14/2009    Past  Surgical History:  Procedure Laterality Date  . A/V FISTULAGRAM Left 05/26/2017   Procedure: A/V FISTULAGRAM;  Surgeon: Conrad West Havre, MD;  Location: [facility];  Service: Cardiovascular;  Laterality: Left;  . A/V FISTULAGRAM Right 11/18/2017   Procedure: A/V FISTULAGRAM - Right Arm;  Surgeon: Elam Dutch, MD;  Location: [facility];  Service: Cardiovascular;  Laterality: Right;  . APPLICATION OF WOUND VAC Left 06/14/2017   Procedure: APPLICATION OF WOUND VAC;  Surgeon: Katha Cabal, MD;  Location: [facility];  Service: Vascular;  Laterality: Left;  . AV FISTULA PLACEMENT  2012   BELIEVED WAS PLACED IN JUNE  . AV FISTULA PLACEMENT Right 08/09/2017   Procedure: Creation Right arm ARTERIOVENOUS BRACHIOCEPOHALIC FISTULA;  Surgeon: Elam Dutch, MD;  [facility] OR;  Service: Vascular;  Laterality: Right;  . AV FISTULA PLACEMENT Right 11/22/2017   Procedure: INSERTION OF ARTERIOVENOUS (AV) GORE-TEX GRAFT RIGHT UPPER ARM;  Surgeon: Elam Dutch, MD;  Location: [facility];  Service: Vascular;  Laterality: Right;  . BIOPSY  01/25/2018  Procedure: BIOPSY;  Surgeon: Jerene Bears, MD;  Location: [facility];  Service: Gastroenterology;;  . BIOPSY  04/10/2019   Procedure: BIOPSY;  Surgeon: Jerene Bears, MD;  Location: [facility];  Service: Gastroenterology;;  . COLONOSCOPY    . COLONOSCOPY WITH PROPOFOL N/A 01/25/2018   Procedure: COLONOSCOPY WITH PROPOFOL;  Surgeon: Jerene Bears, MD;  Location: [facility];  Service: Gastroenterology;  Laterality: N/A;  . CORONARY STENT INTERVENTION N/A 02/22/2017   Procedure: CORONARY STENT INTERVENTION;  Surgeon: Nigel Mormon, MD;  Location: [facility];  Service: Cardiovascular;  Laterality: N/A;  . ESOPHAGOGASTRODUODENOSCOPY (EGD) WITH PROPOFOL N/A 01/25/2018   Procedure: ESOPHAGOGASTRODUODENOSCOPY (EGD) WITH PROPOFOL;  Surgeon: Jerene Bears, MD;  Location: [facility];  Service: Gastroenterology;  Laterality: N/A;  .  ESOPHAGOGASTRODUODENOSCOPY (EGD) WITH PROPOFOL N/A 04/10/2019   Procedure: ESOPHAGOGASTRODUODENOSCOPY (EGD) WITH PROPOFOL;  Surgeon: Jerene Bears, MD;  Location: [facility];  Service: Gastroenterology;  Laterality: N/A;  . FLEXIBLE SIGMOIDOSCOPY N/A 07/15/2017   Procedure: FLEXIBLE SIGMOIDOSCOPY;  Surgeon: Carol Ada, MD;  Location: [facility];  Service: Endoscopy;  Laterality: N/A;  . HEMORRHOID BANDING    . I & D EXTREMITY Left 06/01/2017   Procedure: IRRIGATION AND DEBRIDEMENT LEFT ARM HEMATOMA WITH LIGATION OF LEFT ARM AV FISTULA;  Surgeon: Elam Dutch, MD;  Location: [facility];  Service: Vascular;  Laterality: Left;  . I & D EXTREMITY Left 06/14/2017   Procedure: IRRIGATION AND DEBRIDEMENT EXTREMITY;  Surgeon: Katha Cabal, MD;  Location: [facility];  Service: Vascular;  Laterality: Left;  . INSERTION OF DIALYSIS CATHETER  05/30/2017  . INSERTION OF DIALYSIS CATHETER N/A 05/30/2017   Procedure: INSERTION OF DIALYSIS CATHETER;  Surgeon: Elam Dutch, MD;  Location: [facility];  Service: Vascular;  Laterality: N/A;  . IR PARACENTESIS  08/30/2017  . IR PARACENTESIS  09/29/2017  . IR PARACENTESIS  10/28/2017  . IR PARACENTESIS  11/09/2017  . IR PARACENTESIS  11/16/2017  . IR PARACENTESIS  11/28/2017  . IR PARACENTESIS  12/01/2017  . IR PARACENTESIS  12/06/2017  . IR PARACENTESIS  01/03/2018  . IR PARACENTESIS  01/23/2018  . IR PARACENTESIS  02/07/2018  . IR PARACENTESIS  02/21/2018  . IR PARACENTESIS  03/06/2018  . IR PARACENTESIS  03/17/2018  . IR PARACENTESIS  04/04/2018  . IR PARACENTESIS  12/28/2018  . IR PARACENTESIS  01/08/2019  . IR PARACENTESIS  01/23/2019  . IR PARACENTESIS  02/01/2019  . IR PARACENTESIS  02/19/2019  . IR PARACENTESIS  03/01/2019  . IR PARACENTESIS  03/15/2019  . IR PARACENTESIS  04/03/2019  . IR PARACENTESIS  04/12/2019  . IR PARACENTESIS  05/01/2019  . IR PARACENTESIS  05/08/2019  . IR PARACENTESIS  05/24/2019  . IR PARACENTESIS  06/12/2019  . IR PARACENTESIS   07/09/2019  . IR PARACENTESIS  07/27/2019  . IR PARACENTESIS  08/09/2019  . IR PARACENTESIS  08/21/2019  . IR PARACENTESIS  09/17/2019  . IR PARACENTESIS  10/05/2019  . IR PARACENTESIS  10/29/2019  . IR PARACENTESIS  11/08/2019  . IR RADIOLOGIST EVAL & MGMT  02/14/2018  . IR RADIOLOGIST EVAL & MGMT  02/22/2019  . LEFT HEART CATH AND CORONARY ANGIOGRAPHY N/A 02/22/2017   Procedure: LEFT HEART CATH AND CORONARY ANGIOGRAPHY;  Surgeon: Nigel Mormon, MD;  Location: [facility];  Service: Cardiovascular;  Laterality: N/A;  . LIGATION OF ARTERIOVENOUS  FISTULA Left 09/11/2949   Procedure: Plication of Left Arm Arteriovenous Fistula;  Surgeon: Juanda Crumble  Antony Blackbird, MD;  Location: [facility];  Service: Vascular;  Laterality: Left;  . POLYPECTOMY    . POLYPECTOMY  01/25/2018   Procedure: POLYPECTOMY;  Surgeon: Jerene Bears, MD;  Location: [facility];  Service: Gastroenterology;;  . REVISON OF ARTERIOVENOUS FISTULA Left 0/30/0923   Procedure: PLICATION OF DISTAL ANEURYSMAL SEGEMENT OF LEFT UPPER ARM ARTERIOVENOUS FISTULA;  Surgeon: Elam Dutch, MD;  Location: [facility];  Service: Vascular;  Laterality: Left;  . REVISON OF ARTERIOVENOUS FISTULA Left 3/00/7622   Procedure: Plication of Left Upper Arm Fistula ;  Surgeon: Waynetta Sandy, MD;  Location: [facility];  Service: Vascular;  Laterality: Left;  . SKIN GRAFT SPLIT THICKNESS LEG / FOOT Left    SKIN GRAFT SPLIT THICKNESS LEFT ARM DONOR SITE: LEFT ANTERIOR THIGH  . SKIN SPLIT GRAFT Left 07/04/2017   Procedure: SKIN GRAFT SPLIT THICKNESS LEFT ARM DONOR SITE: LEFT ANTERIOR THIGH;  Surgeon: Elam Dutch, MD;  Location: [facility];  Service: Vascular;  Laterality: Left;  . THROMBECTOMY W/ EMBOLECTOMY Left 06/05/2017   Procedure: EXPLORATION OF LEFT ARM FOR BLEEDING; OVERSEWED PROXIMAL FISTULA;  Surgeon: Angelia Mould, MD;  Location: [facility];  Service: Vascular;  Laterality: Left;  . WOUND EXPLORATION Left 06/03/2017   Procedure: WOUND EXPLORATION  WITH WOUND VAC APPLICATION TO LEFT ARM;  Surgeon: Angelia Mould, MD;  Location: [facility] OR;  Service: Vascular;  Laterality: Left;       Family History  Problem Relation Age of Onset  . Heart disease Mother   . Lung cancer Mother   . Heart disease Father   . Malignant hyperthermia Father   . COPD Father   . Throat cancer Sister   . Esophageal cancer Sister   . Hypertension Other   . COPD Other   . Colon cancer Neg Hx   . Colon polyps Neg Hx   . Rectal cancer Neg Hx   . Stomach cancer Neg Hx     Social History   Tobacco Use  . Smoking status: Current Every Day Smoker    Packs/day: 0.50    Years: 43.00    Pack years: 21.50    Types: Cigarettes    Start date: 08/13/1973  . Smokeless tobacco: Never Used  . Tobacco comment: i dont know i just make   Substance Use Topics  . Alcohol use: Not Currently    Comment: quit drinking in 2017  . Drug use: Not Currently    Types: Marijuana, Cocaine    Comment: reports using once every 3 months, 04-06-2019 was this     Home Medications Prior to Admission medications   Medication Sig Start Date End Date Taking? Authorizing Provider  albuterol (VENTOLIN HFA) 108 (90 Base) MCG/ACT inhaler Inhale 2 puffs into the lungs every 6 (six) hours as needed for wheezing or shortness of breath.   Yes [provider]  budesonide-formoterol (SYMBICORT) 160-4.5 MCG/ACT inhaler Inhale 2 puffs into the lungs 2 (two) times daily.   Yes [provider]  diltiazem (CARDIZEM CD) 240 MG 24 hr capsule TAKE 1 CAPSULE BY MOUTH EVERY DAY Patient taking differently: Take 240 mg by mouth daily.  11/05/19  Yes Adrian Prows, MD  hydrALAZINE (APRESOLINE) 100 MG tablet Take 100 mg by mouth 2 (two) times daily.   Yes [provider]  hydrOXYzine (ATARAX/VISTARIL) 25 MG tablet Take 25 mg by mouth 2 (two) times daily as needed for itching.  08/21/19  Yes [provider]  lactulose ([place]) 10 GM/15ML  solution Take 45 mLs (30 g total)  by mouth daily as needed for mild constipation. 10/01/19  Yes [name], MD  lidocaine (LIDODERM) 5 % Place 1 patch onto the skin daily. Remove & Discard patch within 12 hours or as directed by MD 10/28/19  Yes Iann Rodier A, PA-C  metoprolol tartrate (LOPRESSOR) 100 MG tablet Take 1 tablet (100 mg total) by mouth 2 (two) times daily. 06/04/19  Yes Al Decant, MD  montelukast (SINGULAIR) 10 MG tablet Take 10 mg by mouth every evening.    Yes [provider]  ondansetron (ZOFRAN) 4 MG tablet Take 4 mg by mouth every 4 (four) hours as needed for nausea or vomiting. 09/18/19  Yes [provider]  Oxycodone HCl 10 MG TABS Take 10 mg by mouth 3 (three) times daily as needed (pain).  08/25/19  Yes [provider]  pantoprazole (PROTONIX) 40 MG tablet Take 40 mg by mouth daily.   Yes [provider]  sevelamer carbonate (RENVELA) 800 MG tablet Take 1,600-3,200 mg by mouth See admin instructions. Take 4 tablets (3200 mg) by mouth 3 times daily with meals and 2 tablets (1600 mg) with snacks   Yes [provider]  zolpidem (AMBIEN) 10 MG tablet Take 10 mg by mouth at bedtime as needed for sleep.   Yes [provider]  amiodarone (PACERONE) 200 MG tablet Take 1 tablet (200 mg total) by mouth 2 (two) times daily. 11/26/19 12/26/19  Everline Mahaffy A, PA-C    Allergies    Tramadol, Morphine and related, Pollen extract, Acetaminophen, Aspirin, and Clonidine derivatives  Review of Systems   Review of Systems  Constitutional: Negative for appetite change, chills and fever.  Respiratory: Positive for shortness of breath. Negative for cough and wheezing.   Cardiovascular: Negative for chest pain, palpitations and leg swelling.  Gastrointestinal: Negative for abdominal pain, diarrhea, nausea and vomiting.  Genitourinary: Negative for dysuria.  Musculoskeletal: Negative for back pain, neck pain and neck stiffness.  Skin: Negative for rash.    Allergic/Immunologic: Negative for immunocompromised state.  Neurological: Negative for seizures, syncope, weakness, numbness and headaches.  Psychiatric/Behavioral: Negative for confusion.    Physical Exam Updated Vital Signs BP 119/84 (BP Location: Left Arm)   Pulse 94   Temp (!) 97.4 F (36.3 C) (Oral)   Resp 14   Ht 5\' 9"  (1.753 m)   Wt 64.9 kg   SpO2 100%   BMI 21.12 kg/m   Physical Exam Vitals and nursing note reviewed.  Constitutional:      Appearance: He is well-developed. He is not toxic-appearing.     Comments: Chronically ill-appearing.  No acute distress.  HENT:     Head: Normocephalic.  Eyes:     Conjunctiva/sclera: Conjunctivae normal.  Cardiovascular:     Rate and Rhythm: Normal rate.     Pulses: Normal pulses.     Heart sounds: Normal heart sounds. No murmur. No friction rub. No gallop.      Comments: 2+ DP, PT, and radial pulses. Pulmonary:     Effort: Pulmonary effort is normal. No respiratory distress.     Breath sounds: No stridor. No wheezing, rhonchi or rales.     Comments: No tachypnea or increased work of breathing. Chest:     Chest wall: No tenderness.  Abdominal:     General: There is distension.     Palpations: Abdomen is soft.     Comments: Patient refuses palpation of the abdomen  Musculoskeletal:  Cervical back: Neck supple.     Right lower leg: No edema.     Left lower leg: No edema.  Skin:    General: Skin is warm and dry.  Neurological:     Mental Status: He is alert.     Comments: Alert.  Speaking in complete, fluent sentences.  Psychiatric:        Behavior: Behavior normal.     ED Results / Procedures / Treatments   Labs (all labs ordered are listed, but only abnormal results are displayed) Labs Reviewed  COMPREHENSIVE METABOLIC PANEL - Abnormal; Notable for the following components:      Result Value   Potassium 5.5 (*)    Chloride 93 (*)    BUN 46 (*)    Creatinine, Ser 5.57 (*)    Albumin 2.7 (*)    AST 47  (*)    Alkaline Phosphatase 249 (*)    GFR calc non Af Amer 10 (*)    GFR calc Af Amer 12 (*)    All other components within normal limits  CBC - Abnormal; Notable for the following components:   WBC 10.9 (*)    RBC 3.32 (*)    Hemoglobin 9.5 (*)    HCT 28.7 (*)    RDW 18.6 (*)    All other components within normal limits  I-STAT CHEM 8, ED - Abnormal; Notable for the following components:   Sodium 121 (*)    Chloride 85 (*)    BUN 44 (*)    Creatinine, Ser 5.10 (*)    Glucose, Bld 554 (*)    Calcium, Ion 1.10 (*)    Hemoglobin 10.9 (*)    HCT 32.0 (*)    All other components within normal limits  SARS CORONAVIRUS 2 BY RT PCR (HOSPITAL ORDER, [facility])  MAGNESIUM  CBG MONITORING, ED    EKG EKG Interpretation  Date/Time:  Sunday Nov 25 2019 22:51:52 EDT Ventricular Rate:  100 PR Interval:    QRS Duration: 173 QT Interval:  432 QTC Calculation: 535 R Axis:   -78 Text Interpretation: Sinus tachycardia Multiple ventricular premature complexes Sinus pause Nonspecific IVCD with LAD LVH with secondary repolarization abnormality Confirmed by Dene Gentry [phone]) on 11/25/2019 10:55:42 PM   Radiology DG Chest Portable 1 View  Result Date: 11/25/2019 CLINICAL DATA:  Shortness of breath. Technologist notes state V-tach with pulses. EXAM: PORTABLE CHEST 1 VIEW COMPARISON:  11/14/2019 FINDINGS: Chronic cardiomegaly. Aortic atherosclerosis. Improved bibasilar aeration and decreased pleural effusions from prior exam. Pulmonary edema which is slightly worsened. Streaky right infrahilar opacities. No pneumothorax. Carotid and subclavian vascular calcifications. IMPRESSION: 1. Increased pulmonary edema over the last 10 days. 2. However improved bibasilar aeration and decreased pleural effusions from prior exam. 3. Streaky right infrahilar opacities may be atelectasis, pneumonia or aspiration. 4. Chronic cardiomegaly.  Aortic Atherosclerosis (ICD10-I70.0).  Electronically Signed   By: Keith Rake M.D.   On: 11/25/2019 23:10    Procedures Procedures (including critical care time)  Medications Ordered in ED Medications  melatonin tablet 3 mg (3 mg Oral Given 11/26/19 0001)  amiodarone (NEXTERONE PREMIX) 360-4.14 MG/200ML-% (1.8 mg/mL) IV infusion (0 mg/hr Intravenous Stopped 11/26/19 0310)  diclofenac Sodium (VOLTAREN) 1 % topical gel 2 g (2 g Topical Given 11/26/19 0215)  metoprolol tartrate (LOPRESSOR) injection 5 mg (5 mg Intravenous Given 11/26/19 0319)  sodium zirconium cyclosilicate (LOKELMA) packet 5 g (5 g Oral Given 11/26/19 0416)    ED Course  I have reviewed the triage vital signs and the nursing notes.  Pertinent labs & imaging results that were available during my care of the patient were reviewed by me and considered in my medical decision making (see chart for details).  Clinical Course as of Nov 25 845  Sun Nov 25, 2019  2359 Patient completed 150 mg of amiodarone.  Heart rate had improved to 60s to 80s.  However, shortly thereafter, rate began to increase.  Currently 110s on my evaluation.  The patient was also seen in concert with Dr. Melanee Left, attending physician at shift change.  He recommends starting the patient on amiodarone infusion at 60 per hour.   [MM]    Clinical Course User Index [MM] Le Ferraz, Laymond Purser, PA-C   MDM Rules/Calculators/A&P                      59 year old male with a complicated medical history including ESRD on HD (M/W/F at [name]), paroxysmal atrial fibrillation, liver cirrhosis with ascites, chronic hepatitis B and C, chronic pain, hypertension, CAD, COPD, cocaine use disorder who presents to the emergency department by EMS.  Initially brought in for concern for V. tach with a pulse.  However, patient appeared to be in A. fib with RVR with a wide-complex QRS on arrival to the ER.  Patient has been seen multiple times for similar symptoms based on chart review.  He is alert's, oriented, and has  no increased work of breathing or hypoxia on room air.  Patient is finishing up 150 mg of amiodarone and tachycardia has resolved from rate of 170 that EMS reported when in route.  Spoke with the patient's cardiologist, Dr. Einar Gip after patient's rapid ventricular response had resolved.  He had recommended discharging the patient home and restarting 200 mg of amiodarone daily from a cardiac standpoint.  Shortly after talking to Dr. Einar Gip, patient's rate began to elevate and is now in the 110s and appears regular.  Dr. Melanee Left has recommended starting the patient on amiodarone infusion.  Given need for infusion, Dr. Posey Pronto with nephrology was consulted as the patient will require dialysis in the a.m., but not emergently tonight.  Dr. Posey Pronto will get the patient on the schedule in the morning.  Initial i-STAT with glucose of 554 his sodium of 121.  Patient has no history of diabetes mellitus.  CMP with glucose of 99, creatinine of 5.57, normal sodium of 135, and potassium of 5.5.  Point-of-care glucose was checked and in the 90s.  Suspect CMP is correct as opposed to i-STAT.  After discussing the patient with Dr. Wyvonnia Dusky, he recommended hospital admission.  I spoke with Dr. Alcario Drought with the hospitalist team.  He recommended touching base with Dr. Einar Gip prior to considering hospitalization.  Spoke with Dr. Einar Gip.  He reports that if patient's heart rate is now increased to stop amiodarone, give the patient 5 mg of metoprolol and observe him for 30 to 45 minutes.  If patient does not develop A. fib with RVR, he can be discharged home.  Dr. Einar Gip would like to see him in the office.  Following metoprolol, patient did not develop A. fib with RVR.  Patient is asymptomatic.  He does not get dialyzed until around 11 AM.  He would like to be discharged home.  I think this is reasonable.  We will discharge patient home with outpatient dialysis.  Dr. Posey Pronto with nephrology was updated.  Dr. Einar Gip recommended the patient  increase his home  amiodarone to 200 mg twice daily, which has been ordered.  He is hemodynamically stable and in no acute distress.  Safe for discharge home with outpatient follow-up as indicated.   EKG in the ER without evidence of ischemia.  Final Clinical Impression(s) / ED Diagnoses Final diagnoses:  Atrial fibrillation with RVR ([place])    Rx / DC Orders ED Discharge Orders         Ordered    amiodarone (PACERONE) 200 MG tablet  2 times daily     11/26/19 0501           Libni Fusaro A, PA-C 11/26/19 0847    Ezequiel Essex, MD 11/27/19 0400

## 2019-11-26 DIAGNOSIS — I4891 Unspecified atrial fibrillation: Secondary | ICD-10-CM | POA: Diagnosis not present

## 2019-11-26 LAB — COMPREHENSIVE METABOLIC PANEL
ALT: 33 U/L (ref 0–44)
AST: 47 U/L — ABNORMAL HIGH (ref 15–41)
Albumin: 2.7 g/dL — ABNORMAL LOW (ref 3.5–5.0)
Alkaline Phosphatase: 249 U/L — ABNORMAL HIGH (ref 38–126)
Anion gap: 12 (ref 5–15)
BUN: 46 mg/dL — ABNORMAL HIGH (ref 6–20)
CO2: 30 mmol/L (ref 22–32)
Calcium: 10 mg/dL (ref 8.9–10.3)
Chloride: 93 mmol/L — ABNORMAL LOW (ref 98–111)
Creatinine, Ser: 5.57 mg/dL — ABNORMAL HIGH (ref 0.61–1.24)
GFR calc Af Amer: 12 mL/min — ABNORMAL LOW (ref >60)
GFR calc non Af Amer: 10 mL/min — ABNORMAL LOW (ref >60)
Glucose, Bld: 99 mg/dL (ref 70–99)
Potassium: 5.5 mmol/L — ABNORMAL HIGH (ref 3.5–5.1)
Sodium: 135 mmol/L (ref 135–145)
Total Bilirubin: 1 mg/dL (ref 0.3–1.2)
Total Protein: 6.7 g/dL (ref 6.5–8.1)

## 2019-11-26 LAB — MAGNESIUM: Magnesium: 2.3 mg/dL (ref 1.7–2.4)

## 2019-11-26 LAB — CBG MONITORING, ED: Glucose-Capillary: 93 mg/dL (ref 70–99)

## 2019-11-26 LAB — SARS CORONAVIRUS 2 BY RT PCR (HOSPITAL ORDER, PERFORMED IN ~~LOC~~ HOSPITAL LAB): SARS Coronavirus 2: NEGATIVE

## 2019-11-26 MED ORDER — AMIODARONE HCL 200 MG PO TABS: 200.0000 mg | ORAL_TABLET | Freq: Two times a day (BID) | ORAL | 0 refills | Status: DC

## 2019-11-26 MED ORDER — METOPROLOL TARTRATE 5 MG/5ML IV SOLN
5.0000 mg | Freq: Once | INTRAVENOUS | Status: AC
  Administered 2019-11-26: 5 mg via INTRAVENOUS
  Filled 2019-11-26: qty 5

## 2019-11-26 MED ORDER — SODIUM ZIRCONIUM CYCLOSILICATE 5 G PO PACK
5.0000 g | PACK | Freq: Once | ORAL | Status: AC
  Administered 2019-11-26: 5 g via ORAL
  Filled 2019-11-26: qty 1

## 2019-11-26 NOTE — Discharge Instructions (Addendum)
Thank you for allowing me to care for you today in the Emergency Department.   Go to dialysis today.  Start taking amiodarone 2 times daily.  Take one 200 mg tablet in the morning and a second 200 mg tablet at night.  Call this morning to make an appointment with Dr. Einar Gip sometime this week.  Return to the emergency department if you develop trouble breathing, chest pain, dizziness with a rapid heart rate, or other new, worsening symptoms.

## 2019-11-29 ENCOUNTER — Encounter (HOSPITAL_BASED_OUTPATIENT_CLINIC_OR_DEPARTMENT_OTHER): Payer: Medicare Other | Admitting: Internal Medicine

## 2019-12-01 NOTE — Progress Notes (Signed)
Primary Physician/Referring:  [name], FNP  Patient ID: [name], male    DOB: 1960/07/25, 58 y.o.   MRN: [id number]  Chief Complaint  Patient presents with  . Hospitalization Follow-up  . Atrial Fibrillation   HPI:    [name]  is a 59 y.o. male  with very complex medical problems including severe COPD and chronic cor pulmonale with pulmonary hypertension, coronary artery disease s/p stenting to mid RCA in 2018, paroxysmal atrial fibrillation with RVR, hypertensive cardiomyopathy, dietary and medication noncompliance, end-stage renal disease on hemodialysis, chronic hepatitis B and C, tobacco use, smokes 1/2 ppd, h/o marijuana use and prior cocaine use quit in 2017. Not on anticoagulation due to cirrhosis of liver and compliance with Warfarin.   He has had multiple ER visits for A fib with RVR, syncope, shortness of breath and hyperkalemia.  He has been noncompliant in the past regarding his medications but has been more compliant now and he has not missed any dialysis.  He is now taking amiodarone twice a day that was recommended by me after his recent emergency room visit.  Dyspnea is stable, denies leg edema, abdominal distention has remained stable per patient.  Past Medical History:  Diagnosis Date  . Anemia   . Anxiety   . Arthritis    left shoulder  . Atherosclerosis of aorta ([place])   . Cardiomegaly   . Chest pain    DATE UNKNOWN, C/O PERIODICALLY  . Cocaine abuse ([place])   . COPD exacerbation ([place]) 08/17/2016  . Coronary artery disease    stent 02/22/17  . ESRD (end stage renal disease) on dialysis ([place])    "[name]; MWF" (07/04/2017)  . GERD (gastroesophageal reflux disease)    DATE UNKNOWN  . Hemorrhoids   . Hepatitis B, chronic ([place])   . Hepatitis C   . History of kidney stones   . Hyperkalemia   . Hypertension   . Kidney failure   . Metabolic bone disease    Patient denies  . Mitral stenosis   . Myocardial infarction ([place])    . Pneumonia   . Pulmonary edema   . Solitary rectal ulcer syndrome 07/2017   at flex sig for rectal bleeding  . Tubular adenoma of colon    Past Surgical History:  Procedure Laterality Date  . A/V FISTULAGRAM Left 05/26/2017   Procedure: A/V FISTULAGRAM;  Surgeon: Conrad [place], MD;  Location: [facility];  Service: Cardiovascular;  Laterality: Left;  . A/V FISTULAGRAM Right 11/18/2017   Procedure: A/V FISTULAGRAM - Right Arm;  Surgeon: Elam Dutch, MD;  Location: [place] [facility];  Service: Cardiovascular;  Laterality: Right;  . APPLICATION OF WOUND VAC Left 06/14/2017   Procedure: APPLICATION OF WOUND VAC;  Surgeon: Katha Cabal, MD;  Location: [facility];  Service: Vascular;  Laterality: Left;  . AV FISTULA PLACEMENT  2012   BELIEVED WAS PLACED IN JUNE  . AV FISTULA PLACEMENT Right 08/09/2017   Procedure: Creation Right arm ARTERIOVENOUS BRACHIOCEPOHALIC FISTULA;  Surgeon: Elam Dutch, MD;  Location: [facility];  Service: Vascular;  Laterality: Right;  . AV FISTULA PLACEMENT Right 11/22/2017   Procedure: INSERTION OF ARTERIOVENOUS (AV) GORE-TEX GRAFT RIGHT UPPER ARM;  Surgeon: Elam Dutch, MD;  Location: [place];  Service: Vascular;  Laterality: Right;  . BIOPSY  01/25/2018   Procedure: BIOPSY;  Surgeon: Jerene Bears, MD;  Location: [place];  Service: Gastroenterology;;  . BIOPSY  04/10/2019   Procedure: BIOPSY;  Surgeon: Jerene Bears, MD;  Location: [facility];  Service: Gastroenterology;;  . COLONOSCOPY    . COLONOSCOPY WITH PROPOFOL N/A 01/25/2018   Procedure: COLONOSCOPY WITH PROPOFOL;  Surgeon: Jerene Bears, MD;  Location: [facility];  Service: Gastroenterology;  Laterality: N/A;  . CORONARY STENT INTERVENTION N/A 02/22/2017   Procedure: CORONARY STENT INTERVENTION;  Surgeon: Nigel Mormon, MD;  Location: [place] [facility];  Service: Cardiovascular;  Laterality: N/A;  . ESOPHAGOGASTRODUODENOSCOPY (EGD) WITH PROPOFOL N/A 01/25/2018    Procedure: ESOPHAGOGASTRODUODENOSCOPY (EGD) WITH PROPOFOL;  Surgeon: Jerene Bears, MD;  Location: [place] [place];  Service: Gastroenterology;  Laterality: N/A;  . ESOPHAGOGASTRODUODENOSCOPY (EGD) WITH PROPOFOL N/A 04/10/2019   Procedure: ESOPHAGOGASTRODUODENOSCOPY (EGD) WITH PROPOFOL;  Surgeon: Jerene Bears, MD;  Location: [facility];  Service: Gastroenterology;  Laterality: N/A;  . FLEXIBLE SIGMOIDOSCOPY N/A 07/15/2017   Procedure: FLEXIBLE SIGMOIDOSCOPY;  Surgeon: Carol Ada, MD;  Location: [place];  Service: Endoscopy;  Laterality: N/A;  . HEMORRHOID BANDING    . I & D EXTREMITY Left 06/01/2017   Procedure: IRRIGATION AND DEBRIDEMENT LEFT ARM HEMATOMA WITH LIGATION OF LEFT ARM AV FISTULA;  Surgeon: Elam Dutch, MD;  Location: [place];  Service: Vascular;  Laterality: Left;  . I & D EXTREMITY Left 06/14/2017   Procedure: IRRIGATION AND DEBRIDEMENT EXTREMITY;  Surgeon: Katha Cabal, MD;  Location: [facility];  Service: Vascular;  Laterality: Left;  . INSERTION OF DIALYSIS CATHETER  05/30/2017  . INSERTION OF DIALYSIS CATHETER N/A 05/30/2017   Procedure: INSERTION OF DIALYSIS CATHETER;  Surgeon: Elam Dutch, MD;  Location: [place];  Service: Vascular;  Laterality: N/A;  . IR PARACENTESIS  08/30/2017  . IR PARACENTESIS  09/29/2017  . IR PARACENTESIS  10/28/2017  . IR PARACENTESIS  11/09/2017  . IR PARACENTESIS  11/16/2017  . IR PARACENTESIS  11/28/2017  . IR PARACENTESIS  12/01/2017  . IR PARACENTESIS  12/06/2017  . IR PARACENTESIS  01/03/2018  . IR PARACENTESIS  01/23/2018  . IR PARACENTESIS  02/07/2018  . IR PARACENTESIS  02/21/2018  . IR PARACENTESIS  03/06/2018  . IR PARACENTESIS  03/17/2018  . IR PARACENTESIS  04/04/2018  . IR PARACENTESIS  12/28/2018  . IR PARACENTESIS  01/08/2019  . IR PARACENTESIS  01/23/2019  . IR PARACENTESIS  02/01/2019  . IR PARACENTESIS  02/19/2019  . IR PARACENTESIS  03/01/2019  . IR PARACENTESIS  03/15/2019  . IR PARACENTESIS  04/03/2019  . IR PARACENTESIS   04/12/2019  . IR PARACENTESIS  05/01/2019  . IR PARACENTESIS  05/08/2019  . IR PARACENTESIS  05/24/2019  . IR PARACENTESIS  06/12/2019  . IR PARACENTESIS  07/09/2019  . IR PARACENTESIS  07/27/2019  . IR PARACENTESIS  08/09/2019  . IR PARACENTESIS  08/21/2019  . IR PARACENTESIS  09/17/2019  . IR PARACENTESIS  10/05/2019  . IR PARACENTESIS  10/29/2019  . IR PARACENTESIS  11/08/2019  . IR RADIOLOGIST EVAL & MGMT  02/14/2018  . IR RADIOLOGIST EVAL & MGMT  02/22/2019  . LEFT HEART CATH AND CORONARY ANGIOGRAPHY N/A 02/22/2017   Procedure: LEFT HEART CATH AND CORONARY ANGIOGRAPHY;  Surgeon: Nigel Mormon, MD;  Location: [place] [facility];  Service: Cardiovascular;  Laterality: N/A;  . LIGATION OF ARTERIOVENOUS  FISTULA Left 08/17/7122   Procedure: Plication of Left Arm Arteriovenous Fistula;  Surgeon: Elam Dutch, MD;  Location: [place];  Service: Vascular;  Laterality: Left;  . POLYPECTOMY    .  POLYPECTOMY  01/25/2018   Procedure: POLYPECTOMY;  Surgeon: Jerene Bears, MD;  Location: [place];  Service: Gastroenterology;;  . REVISON OF ARTERIOVENOUS FISTULA Left 6/44/0347   Procedure: PLICATION OF DISTAL ANEURYSMAL SEGEMENT OF LEFT UPPER ARM ARTERIOVENOUS FISTULA;  Surgeon: Elam Dutch, MD;  Location: [place];  Service: Vascular;  Laterality: Left;  . REVISON OF ARTERIOVENOUS FISTULA Left 11/03/9561   Procedure: Plication of Left Upper Arm Fistula ;  Surgeon: Waynetta Sandy, MD;  Location: [place];  Service: Vascular;  Laterality: Left;  . SKIN GRAFT SPLIT THICKNESS LEG / FOOT Left    SKIN GRAFT SPLIT THICKNESS LEFT ARM DONOR SITE: LEFT ANTERIOR THIGH  . SKIN SPLIT GRAFT Left 07/04/2017   Procedure: SKIN GRAFT SPLIT THICKNESS LEFT ARM DONOR SITE: LEFT ANTERIOR THIGH;  Surgeon: Elam Dutch, MD;  Location: [place];  Service: Vascular;  Laterality: Left;  . THROMBECTOMY W/ EMBOLECTOMY Left 06/05/2017   Procedure: EXPLORATION OF LEFT ARM FOR BLEEDING; OVERSEWED PROXIMAL FISTULA;   Surgeon: Angelia Mould, MD;  Location: [place];  Service: Vascular;  Laterality: Left;  . WOUND EXPLORATION Left 06/03/2017   Procedure: WOUND EXPLORATION WITH WOUND VAC APPLICATION TO LEFT ARM;  Surgeon: Angelia Mould, MD;  Location: [facility] OR;  Service: Vascular;  Laterality: Left;   Family History  Problem Relation Age of Onset  . Heart disease Mother   . Lung cancer Mother   . Heart disease Father   . Malignant hyperthermia Father   . COPD Father   . Throat cancer Sister   . Esophageal cancer Sister   . Hypertension Other   . COPD Other   . Colon cancer Neg Hx   . Colon polyps Neg Hx   . Rectal cancer Neg Hx   . Stomach cancer Neg Hx     Social History   Tobacco Use  . Smoking status: Current Every Day Smoker    Packs/day: 0.50    Years: 43.00    Pack years: 21.50    Types: Cigarettes    Start date: 08/13/1973  . Smokeless tobacco: Never Used  . Tobacco comment: i dont know i just make   Substance Use Topics  . Alcohol use: Not Currently    Comment: quit drinking in 2017   Marital Status: Single  ROS  Review of Systems  Cardiovascular: Positive for dyspnea on exertion (chronic) and palpitations. Negative for leg swelling and syncope.  Respiratory: Positive for wheezing. Negative for hemoptysis.   Endocrine: Negative for cold intolerance.  Hematologic/Lymphatic: Does not bruise/bleed easily.  Gastrointestinal: Positive for bloating. Negative for hematochezia and melena.   Objective  Blood pressure 131/60, pulse 88, temperature 97.8 F (36.6 C), temperature source Oral, resp. rate 16, height 5\' 9"  (1.753 m), weight 151 lb (68.5 kg), SpO2 97 %.  Vitals with BMI 12/03/2019 11/26/2019 11/26/2019  Height 5\' 9"  - -  Weight 151 lbs - -  BMI 87.56 - -  Systolic 433 295 188  Diastolic 60 84 75  Pulse 88 94 60  Some encounter information is confidential and restricted. Go to Review Flowsheets activity to see all data.     Physical Exam  Constitutional: He  appears cachectic. He appears ill. No distress.  Cardiovascular: Normal rate and regular rhythm.  Murmur heard.  Early systolic murmur is present with a grade of 2/6 at the upper right sternal border.  Low-pitched rumbling crescendo presystolic murmur is present with a grade of 3/6 at the apex. S1 normal  and S2 loud. No murmur. No JVD Right arm AV graft noted  Pulmonary/Chest: Effort normal and breath sounds normal. No accessory muscle usage. No respiratory distress.  Abdominal: Soft. Bowel sounds are normal. He exhibits distension.  Ascites present  Musculoskeletal:        General: Normal range of motion.  Vitals reviewed.  Laboratory examination:   Recent Labs    11/12/19 2341 11/12/19 2341 11/14/19 1115 11/25/19 2300 11/25/19 2331  NA 137   < > 137 121* 135  K 5.2*   < > 6.4* 5.0 5.5*  CL 95*   < > 95* 85* 93*  CO2 29  --  24  --  30  GLUCOSE 124*   < > 128* 554* 99  BUN 33*   < > 46* 44* 46*  CREATININE 5.39*   < > 7.10* 5.10* 5.57*  CALCIUM 8.8*  --  9.7  --  10.0  GFRNONAA 11*  --  8*  --  10*  GFRAA 13*  --  9*  --  12*   < > = values in this interval not displayed.   estimated creatinine clearance is 14.2 mL/min (A) (by C-G formula based on SCr of 5.57 mg/dL (H)).  CMP Latest Ref Rng & Units 11/25/2019 11/25/2019 11/14/2019  Glucose 70 - 99 mg/dL 99 554(HH) 128(H)  BUN 6 - 20 mg/dL 46(H) 44(H) 46(H)  Creatinine 0.61 - 1.24 mg/dL 5.57(H) 5.10(H) 7.10(H)  Sodium 135 - 145 mmol/L 135 121(L) 137  Potassium 3.5 - 5.1 mmol/L 5.5(H) 5.0 6.4(HH)  Chloride 98 - 111 mmol/L 93(L) 85(L) 95(L)  CO2 22 - 32 mmol/L 30 - 24  Calcium 8.9 - 10.3 mg/dL 10.0 - 9.7  Total Protein 6.5 - 8.1 g/dL 6.7 - -  Total Bilirubin 0.3 - 1.2 mg/dL 1.0 - -  Alkaline Phos 38 - 126 U/L 249(H) - -  AST 15 - 41 U/L 47(H) - -  ALT 0 - 44 U/L 33 - -   CBC Latest Ref Rng & Units 11/25/2019 11/25/2019 11/14/2019  WBC 4.0 - 10.5 K/uL 10.9(H) - 8.8  Hemoglobin 13.0 - 17.0 g/dL 9.5(L) 10.9(L) 9.9(L)    Hematocrit 39.0 - 52.0 % 28.7(L) 32.0(L) 29.5(L)  Platelets 150 - 400 K/uL 194 - 314   Lipid Panel     Component Value Date/Time   CHOL 103 08/17/2016 0134   TRIG 183 (H) 10/04/2019 1400   HDL 34 (L) 08/17/2016 0134   CHOLHDL 3.0 08/17/2016 0134   VLDL 19 08/17/2016 0134   LDLCALC 50 08/17/2016 0134   HEMOGLOBIN A1C Lab Results  Component Value Date   HGBA1C 4.7 (L) 09/26/2019   MPG 88.19 09/26/2019   TSH No results for input(s): TSH in the last 8760 hours.  Lipid Panel Recent Labs    10/04/19 1400  TRIG 183*    External labs:   03/11/2018: TSH 1.32 normal  Medications and allergies   Allergies  Allergen Reactions  . Tramadol Itching and Other (See Comments)  . Morphine And Related Other (See Comments)    Stomach pain  . Pollen Extract     Other reaction(s): Sneezing (finding)  . Acetaminophen Nausea Only    Stomach ache Other reaction(s): Other (See Comments) Stomach ache  . Aspirin Other (See Comments) and Itching    STOMACH PAIN Other reaction(s): Unknown STOMACH PAIN  . Clonidine Derivatives Itching     Current Outpatient Medications  Medication Instructions  . albuterol (VENTOLIN HFA) 108 (90 Base) MCG/ACT inhaler  2 puffs, Inhalation, Every 6 hours PRN  . amiodarone (PACERONE) 200 mg, Oral, 2 times daily  . budesonide-formoterol (SYMBICORT) 160-4.5 MCG/ACT inhaler 2 puffs, Inhalation, 2 times daily  . diltiazem (CARDIZEM CD) 240 MG 24 hr capsule TAKE 1 CAPSULE BY MOUTH EVERY DAY  . hydrALAZINE (APRESOLINE) 100 mg, Oral, 2 times daily  . hydrOXYzine (ATARAX/VISTARIL) 25 mg, Oral, 2 times daily PRN  . lactulose (CHRONULAC) 30 g, Oral, Daily PRN  . lidocaine (LIDODERM) 5 % 1 patch, Transdermal, Every 24 hours, Remove & Discard patch within 12 hours or as directed by MD  . metoprolol tartrate (LOPRESSOR) 100 mg, Oral, 2 times daily  . montelukast (SINGULAIR) 10 mg, Oral, Every evening  . ondansetron (ZOFRAN) 4 mg, Oral, Every 4 hours PRN  .  Oxycodone HCl 10 mg, Oral, 3 times daily PRN  . pantoprazole (PROTONIX) 40 mg, Oral, Daily  . sevelamer carbonate (RENVELA) 1,600-3,200 mg, Oral, See admin instructions, Take 4 tablets (3200 mg) by mouth 3 times daily with meals and 2 tablets (1600 mg) with snacks  . zolpidem (AMBIEN) 10 mg, Oral, At bedtime PRN   Radiology:   No results found.  Cardiac Studies:   Coronary Angiogram 02/22/2017: No significant disease on left, mid RCA high-grade stenosis status post 3.5 x 23 mm Xience [place] DES.  Carotid Doppler 10/28/2016: Stenosis in the right common carotid artery (<50%) with severe heteregenous plaque. Stenosis in the left internal carotid artery (16-49%). Severe heteregenous plaque in the left common carotid artery with < 50% stenosis. Mild stenosis in the left external carotid artery (<50%). Antegrade vertebral artery flow. Follow up in one year is appropriate if clinically indicated.  Lower Venous DVT Study 10/05/2019: BILATERAL: - No evidence of deep vein thrombosis seen in the lower extremities, bilaterally.  RIGHT & LEFT: - No cystic structure found in the popliteal fossa.   Echocardiogram 10/04/2019: 1. Low normal LV systolic function; severe LVH (myocardium with specked appearance; consider amyloid); mild to moderate AS (mean gradient 11 mmHg  and AVA 1.3 cm2); mild AI; thickened MV with mild to moderate MS (mean gradient 6 mmHg and MVA 1.2-2 cm2;  pt in atrial fibrillation at time of study); severe biatrial enlargement; mild RVE; severe RV dysfunction; severe TR with severe pulmonary hypertension.  2. Left ventricular ejection fraction, by estimation, is 50 to 55%. The left ventricle has low normal function. The left ventricle has no regional  wall motion abnormalities. There is severe left ventricular hypertrophy.  Left ventricular diastolic function could not be evaluated.  3. Right ventricular systolic function is severely reduced. The right ventricular size is mildly  enlarged. There is severely elevated pulmonary artery systolic pressure.  4. Left atrial size was severely dilated.  5. Right atrial size was severely dilated.  6. Large pleural effusion in the left lateral region.  7. The mitral valve is normal in structure. Mild mitral valve regurgitation. Mild to moderate mitral stenosis.  8. Tricuspid valve regurgitation is severe.  9. The aortic valve is normal in structure. Aortic valve regurgitation is mild. Mild to moderate aortic valve stenosis.  10. The inferior vena cava is dilated in size with <50% respiratory variability, suggesting right atrial pressure of 15 mmHg.  EKG  EKG 12/03/2019: Underlying possibly atypical atrial flutter with 2: 1 conduction, ventricular rate 92 bpm, left bundle branch block.  No further analysis.  PVC.   11/25/2019: Sinus tachycardia. Multiple ventricular premature complexes. Sinus pause. Nonspecific IVCD with LAD. LVH with secondary repolarization abnormality  08/16/2019: Sinus rhythm with first-degree AV block at rate of 87 bpm, left atrial enlargement, left bundle branch block.  No further analysis.    08/01/2019: Atrial fibrillation at 79 bpm, LBBB. No further analysis due to LBBB.  Assessment     ICD-10-CM   1. Paroxysmal atrial fibrillation (HCC)  I48.0 EKG 12-Lead  2. Left bundle branch block  I44.7   3. Atherosclerosis of native coronary artery of native heart without angina pectoris  I25.10   4. Cor pulmonale, chronic (HCC)  I27.81   5. DNR (do not resuscitate) discussion  Z71.89      No orders of the defined types were placed in this encounter.   There are no discontinued medications.  Recommendations:   [name]  is a 59 y.o. male  with very complex medical problems including severe COPD and chronic cor pulmonale with pulmonary hypertension, coronary artery disease s/p stenting to mid RCA in 2018, paroxysmal atrial fibrillation with RVR, hypertensive cardiomyopathy, dietary and  medication noncompliance, end-stage renal disease on hemodialysis, chronic hepatitis B and C, tobacco use, smokes 1/2 ppd, h/o marijuana use and prior cocaine use quit in 2017. Not on anticoagulation due to cirrhosis of liver and compliance with Warfarin.   He has been more compliant with follow-ups, he has had multiple emergency room visits, after recent ED visit, I had increased his amiodarone to 200 mg p.o. twice daily in spite of him having cirrhosis of liver due to difficulty in controlling his atrial fibrillation heart rate in spite of being on max dose of beta-blocker and also calcium channel blocker therapy.  His heart rate is much better controlled today and he appears to be in atypical atrial flutter.  Not a candidate for anticoagulation.  There is no clinical evidence of heart failure.  He does have ascites.  I would like to see him back in 3 weeks for follow-up of atrial fibrillation specifically, will consider decreasing the dose of amiodarone to 200 mg daily.  With regard to coronary artery disease he remains angina free. Echocardiogram does reveal preserved LVEF and severe hypertension heart disease, cor pulmonale with RV dilatation and RVH but preserved RV function.  Smoking cessation again discussed.  He wishes to be DNR.  Adrian Prows, MD, [facility] 12/03/2019, 10:18 AM [facility] [place] Pager: [phone] Office: [phone]

## 2019-12-03 ENCOUNTER — Encounter: Payer: Self-pay | Admitting: Cardiology

## 2019-12-03 ENCOUNTER — Ambulatory Visit: Payer: Medicare Other | Admitting: Cardiology

## 2019-12-03 ENCOUNTER — Other Ambulatory Visit: Payer: Self-pay

## 2019-12-03 VITALS — BP 131/60 | HR 88 | Temp 97.8°F | Resp 16 | Ht 69.0 in | Wt 151.0 lb

## 2019-12-03 DIAGNOSIS — Z7189 Other specified counseling: Secondary | ICD-10-CM

## 2019-12-03 DIAGNOSIS — I2510 Atherosclerotic heart disease of native coronary artery without angina pectoris: Secondary | ICD-10-CM

## 2019-12-03 DIAGNOSIS — I447 Left bundle-branch block, unspecified: Secondary | ICD-10-CM

## 2019-12-03 DIAGNOSIS — I2781 Cor pulmonale (chronic): Secondary | ICD-10-CM

## 2019-12-03 DIAGNOSIS — I480 Paroxysmal atrial fibrillation: Secondary | ICD-10-CM

## 2019-12-03 NOTE — Progress Notes (Signed)
KOBYN, KRAY ([id number]) Visit Report for 11/15/2019 Arrival Information Details Patient Name: Date of Service: [name]. 11/15/2019 10:15 A M Medical Record Number: [id number] Patient Account Number: [id number] Date of Birth/Sex: Treating RN: 12/31/1960 (59 y.o. Lorette Ang, Meta.Reding Primary Care Arlen Legendre: Dustin Folks Other Clinician: Referring Leovanni Bjorkman: Treating Norvel Wenker/Extender: Stefanie Libel, FRED Weeks in Treatment: 2 Visit Information History Since Last Visit Added or deleted any medications: No Patient Arrived: Wheel Chair Any new allergies or adverse reactions: No Arrival Time: 10:10 Had a fall or experienced change in No Accompanied By: self activities of daily living that may affect Transfer Assistance: None risk of falls: Patient Identification Verified: Yes Signs or symptoms of abuse/neglect since last visito No Secondary Verification Process Completed: Yes Hospitalized since last visit: No Patient Requires Transmission-Based Precautions: No Implantable device outside of the clinic excluding No Patient Has Alerts: No cellular tissue based products placed in the center since last visit: Has Dressing in Place as Prescribed: Yes Pain Present Now: No Electronic Signature(s) Signed: 11/20/2019 8:52:48 AM By: Sandre Kitty Entered By: Sandre Kitty on 11/15/2019 10:12:30 -------------------------------------------------------------------------------- Clinic Level of Care Assessment Details Patient Name: Date of Service: Joshua 15, [name]. 11/15/2019 10:15 A M Medical Record Number: [id number] Patient Account Number: [id number] Date of Birth/Sex: Treating RN: [birth year] (59 y.o. [name] Primary Care Lexine Jaspers: Dustin Folks Other Clinician: Referring Brin Ruggerio: Treating Jatinder Mcdonagh/Extender: Stefanie Libel, FRED Weeks in Treatment: 2 Clinic Level of Care Assessment Items TOOL 4 Quantity Score X- 1 0 Use when only an EandM is performed  on FOLLOW-UP visit ASSESSMENTS - Nursing Assessment / Reassessment X- 1 10 Reassessment of Co-morbidities (includes updates in patient status) X- 1 5 Reassessment of Adherence to Treatment Plan ASSESSMENTS - Wound and Skin A ssessment / Reassessment X - Simple Wound Assessment / Reassessment - one wound 1 5 []  - 0 Complex Wound Assessment / Reassessment - multiple wounds X- 1 10 Dermatologic / Skin Assessment (not related to wound area) ASSESSMENTS - Focused Assessment []  - 0 Circumferential Edema Measurements - multi extremities X- 1 10 Nutritional Assessment / Counseling / Intervention []  - 0 Lower Extremity Assessment (monofilament, tuning fork, pulses) []  - 0 Peripheral Arterial Disease Assessment (using hand held doppler) ASSESSMENTS - Ostomy and/or Continence Assessment and Care []  - 0 Incontinence Assessment and Management []  - 0 Ostomy Care Assessment and Management (repouching, etc.) PROCESS - Coordination of Care X - Simple Patient / Family Education for ongoing care 1 15 []  - 0 Complex (extensive) Patient / Family Education for ongoing care X- 1 10 Staff obtains Programmer, systems, Records, T Results / Process Orders est []  - 0 Staff telephones HHA, Nursing Homes / Clarify orders / etc []  - 0 Routine Transfer to another Facility (non-emergent condition) []  - 0 Routine Hospital Admission (non-emergent condition) []  - 0 New Admissions / Biomedical engineer / Ordering NPWT Apligraf, etc. , []  - 0 Emergency Hospital Admission (emergent condition) X- 1 10 Simple Discharge Coordination []  - 0 Complex (extensive) Discharge Coordination PROCESS - Special Needs []  - 0 Pediatric / Minor Patient Management []  - 0 Isolation Patient Management []  - 0 Hearing / Language / Visual special needs []  - 0 Assessment of Community assistance (transportation, D/C planning, etc.) []  - 0 Additional assistance / Altered mentation []  - 0 Support Surface(s) Assessment (bed,  cushion, seat, etc.) INTERVENTIONS - Wound Cleansing / Measurement X - Simple Wound Cleansing - one wound 1 5 []  - 0 Complex Wound Cleansing -  multiple wounds X- 1 5 Wound Imaging (photographs - any number of wounds) []  - 0 Wound Tracing (instead of photographs) X- 1 5 Simple Wound Measurement - one wound []  - 0 Complex Wound Measurement - multiple wounds INTERVENTIONS - Wound Dressings X - Small Wound Dressing one or multiple wounds 1 10 []  - 0 Medium Wound Dressing one or multiple wounds []  - 0 Large Wound Dressing one or multiple wounds []  - 0 Application of Medications - topical []  - 0 Application of Medications - injection INTERVENTIONS - Miscellaneous []  - 0 External ear exam []  - 0 Specimen Collection (cultures, biopsies, blood, body fluids, etc.) []  - 0 Specimen(s) / Culture(s) sent or taken to Lab for analysis []  - 0 Patient Transfer (multiple staff / Civil Service fast streamer / Similar devices) []  - 0 Simple Staple / Suture removal (25 or less) []  - 0 Complex Staple / Suture removal (26 or more) []  - 0 Hypo / Hyperglycemic Management (close monitor of Blood Glucose) []  - 0 Ankle / Brachial Index (ABI) - do not check if billed separately X- 1 5 Vital Signs Has the patient been seen at the hospital within the last three years: Yes Total Score: 105 Level Of Care: New/Established - Level 3 Electronic Signature(s) Signed: 11/15/2019 5:34:26 PM By: Deon Pilling Entered By: Deon Pilling on 11/15/2019 10:54:28 -------------------------------------------------------------------------------- Encounter Discharge Information Details Patient Name: Date of Service: [name], [name]. 11/15/2019 10:15 A M Medical Record Number: [id number] Patient Account Number: [id number] Date of Birth/Sex: Treating RN: 10-19-1960 (59 y.o. [name] Primary Care Kesleigh Morson: Dustin Folks Other Clinician: Referring Joanathan Affeldt: Treating Gaylynn Seiple/Extender: Stefanie Libel, FRED Weeks in  Treatment: 2 Encounter Discharge Information Items Discharge Condition: Stable Ambulatory Status: Wheelchair Discharge Destination: Home Transportation: Private Auto Accompanied By: alone Schedule Follow-up Appointment: Yes Clinical Summary of Care: Patient Declined Electronic Signature(s) Signed: 11/15/2019 5:16:23 PM By: Levan Hurst RN, BSN Entered By: Levan Hurst on 11/15/2019 11:02:38 -------------------------------------------------------------------------------- Multi Wound Chart Details Patient Name: Date of Service: [name], [name]. 11/15/2019 10:15 A M Medical Record Number: [id number] Patient Account Number: [id number] Date of Birth/Sex: Treating RN: 11-Jan-1961 (59 y.o. [name] Primary Care Garrett Bowring: Dustin Folks Other Clinician: Referring Thoams Siefert: Treating Navina Wohlers/Extender: Stefanie Libel, FRED Weeks in Treatment: 2 Vital Signs Height(in): 69 Pulse(bpm): 101 Weight(lbs): 142 Blood Pressure(mmHg): 125/56 Body Mass Index(BMI): 21 Temperature(F): 97.6 Respiratory Rate(breaths/min): 18 Photos: [1:No Photos Sacrum] [N/A:N/A N/A] Wound Location: [1:Pressure Injury] [N/A:N/A] Wounding Event: [1:Pressure Ulcer] [N/A:N/A] Primary Etiology: [1:Anemia, Chronic Obstructive] [N/A:N/A] Comorbid History: [1:Pulmonary Disease (COPD), Arrhythmia, Coronary Artery Disease, Hypertension, Myocardial Infarction, Cirrhosis , Hepatitis B, Hepatitis C, End Stage Renal Disease, Osteoarthritis, Confinement Anxiety 09/10/2019] [N/A:N/A] Date Acquired: [1:2] [N/A:N/A] Weeks of Treatment: [1:Open] [N/A:N/A] Wound Status: [1:2.2x1.8x0.1] [N/A:N/A] Measurements L x W x D (cm) [1:3.11] [N/A:N/A] A (cm) : rea [1:0.311] [N/A:N/A] Volume (cm) : [1:55.40%] [N/A:N/A] % Reduction in A rea: [1:55.40%] [N/A:N/A] % Reduction in Volume: [1:Unstageable/Unclassified] [N/A:N/A] Classification: [1:Medium] [N/A:N/A] Exudate A mount: [1:Serosanguineous] [N/A:N/A] Exudate Type: [1:red,  brown] [N/A:N/A] Exudate Color: [1:Flat and Intact] [N/A:N/A] Wound Margin: [1:Medium (34-66%)] [N/A:N/A] Granulation A mount: [1:Pink] [N/A:N/A] Granulation Quality: [1:Medium (34-66%)] [N/A:N/A] Necrotic A mount: [1:Fat Layer (Subcutaneous Tissue)] [N/A:N/A] Exposed Structures: [1:Exposed: Yes Fascia: No Tendon: No Muscle: No Joint: No Bone: No Small (1-33%)] [N/A:N/A] Treatment Notes Wound #1 (Sacrum) 1. Cleanse With Wound Cleanser 3. Primary Dressing Applied Other primary dressing (specifiy in notes) 4. Secondary Dressing Foam Border Dressing Notes medihoney as Programmer, applications) Signed:  11/15/2019 5:30:42 PM By: Linton Ham MD Signed: 12/03/2019 1:34:25 PM By: Deon Pilling Entered By: Linton Ham on 11/15/2019 11:42:44 -------------------------------------------------------------------------------- Multi-Disciplinary Care Plan Details Patient Name: Date of Service: [name], [name]. 11/15/2019 10:15 A M Medical Record Number: [id number] Patient Account Number: [id number] Date of Birth/Sex: Treating RN: October 28, 1960 (59 y.o. [name] Primary Care Darnell Stimson: Dustin Folks Other Clinician: Referring Jozalynn Noyce: Treating Jaelyn Cloninger/Extender: Stefanie Libel, FRED Weeks in Treatment: 2 Active Inactive Wound/Skin Impairment Nursing Diagnoses: Knowledge deficit related to ulceration/compromised skin integrity Goals: Patient/caregiver will verbalize understanding of skin care regimen Date Initiated: 10/30/2019 Target Resolution Date: 12/27/2019 Goal Status: Active Ulcer/skin breakdown will have a volume reduction of 30% by week 4 Date Initiated: 10/30/2019 Target Resolution Date: 12/27/2019 Goal Status: Active Interventions: Assess patient/caregiver ability to obtain necessary supplies Assess patient/caregiver ability to perform ulcer/skin care regimen upon admission and as needed Assess ulceration(s) every visit Notes: Electronic  Signature(s) Signed: 11/15/2019 5:34:26 PM By: Deon Pilling Signed: 12/03/2019 1:34:25 PM By: Deon Pilling Entered By: Deon Pilling on 11/15/2019 10:43:21 -------------------------------------------------------------------------------- Pain Assessment Details Patient Name: Date of Service: [name], [name]. 11/15/2019 10:15 A M Medical Record Number: [id number] Patient Account Number: [id number] Date of Birth/Sex: Treating RN: 1961/05/28 (59 y.o. [name] Primary Care Chealsey Miyamoto: Dustin Folks Other Clinician: Referring Sterling Mondo: Treating Novi Calia/Extender: Stefanie Libel, FRED Weeks in Treatment: 2 Active Problems Location of Pain Severity and Description of Pain Patient Has Paino No Site Locations Pain Management and Medication Current Pain Management: Electronic Signature(s) Signed: 11/20/2019 8:52:48 AM By: Sandre Kitty Signed: 12/03/2019 1:34:25 PM By: Deon Pilling Entered By: Sandre Kitty on 11/15/2019 10:13:00 -------------------------------------------------------------------------------- Patient/Caregiver Education Details Patient Name: Date of Service: [name], [name]. 5/6/2021andnbsp10:15 A M Medical Record Number: [id number] Patient Account Number: [id number] Date of Birth/Gender: Treating RN: 1961/04/13 (59 y.o. [name] Primary Care Physician: Dustin Folks Other Clinician: Referring Physician: Treating Physician/Extender: Stefanie Libel, FRED Weeks in Treatment: 2 Education Assessment Education Provided To: Patient Education Topics Provided Wound/Skin Impairment: Handouts: Skin Care Do's and Dont's Methods: Explain/Verbal Responses: Reinforcements needed Electronic Signature(s) Signed: 11/15/2019 5:34:26 PM By: Deon Pilling Entered By: Deon Pilling on 11/15/2019 10:43:33 -------------------------------------------------------------------------------- Wound Assessment Details Patient Name: Date of Service: Joshua  Diaz, [name]. 11/15/2019 10:15 A M Medical Record Number: [id number] Patient Account Number: [id number] Date of Birth/Sex: Treating RN: Nov 24, 1960 (59 y.o. [name] Primary Care Lorea Kupfer: Dustin Folks Other Clinician: Referring Sharay Bellissimo: Treating Sigrid Schwebach/Extender: Stefanie Libel, FRED Weeks in Treatment: 2 Wound Status Wound Number: 1 Primary Pressure Ulcer Etiology: Wound Location: Sacrum Wound Open Wounding Event: Pressure Injury Status: Date Acquired: 09/10/2019 Comorbid Anemia, Chronic Obstructive Pulmonary Disease (COPD), Weeks Of Treatment: 2 History: Arrhythmia, Coronary Artery Disease, Hypertension, Myocardial Clustered Wound: No Infarction, Cirrhosis , Hepatitis B, Hepatitis C, End Stage Renal Disease, Osteoarthritis, Confinement Anxiety Photos Photo Uploaded By: Mikeal Hawthorne on 11/16/2019 08:38:26 Wound Measurements Length: (cm) 2.2 Width: (cm) 1.8 Depth: (cm) 0.1 Area: (cm) 3.11 Volume: (cm) 0.311 Wound Description Classification: Unstageable/Unclassified Wound Margin: Flat and Intact Exudate Amount: Medium Exudate Type: Serosanguineous Exudate Color: red, brown Foul Odor After Cleansing: Slough/Fibrino % Reduction in Area: 55.4% % Reduction in Volume: 55.4% Epithelialization: Small (1-33%) Tunneling: No Undermining: No No Yes Wound Bed Granulation Amount: Medium (34-66%) Exposed Structure Granulation Quality: Pink Fascia Exposed: No Necrotic Amount: Medium (34-66%) Fat Layer (Subcutaneous Tissue) Exposed: Yes Necrotic Quality: Adherent Slough Tendon Exposed: No Muscle Exposed: No Joint Exposed: No Bone Exposed: No  Treatment Notes Wound #1 (Sacrum) 1. Cleanse With Wound Cleanser 3. Primary Dressing Applied Other primary dressing (specifiy in notes) 4. Secondary Dressing Foam Border Dressing Notes medihoney as primary Electronic Signature(s) Signed: 11/15/2019 5:16:23 PM By: Levan Hurst RN, BSN Signed: 12/03/2019 1:34:25  PM By: Deon Pilling Entered By: Levan Hurst on 11/15/2019 10:26:39 -------------------------------------------------------------------------------- [place] Details Patient Name: Date of Service: [name], [name]. 11/15/2019 10:15 A M Medical Record Number: [id number] Patient Account Number: [id number] Date of Birth/Sex: Treating RN: [birth year] (59 y.o. [name] Primary Care Rateel Beldin: Dustin Folks Other Clinician: Referring Janyce Ellinger: Treating Aisley Whan/Extender: Stefanie Libel, FRED Weeks in Treatment: 2 Vital Signs Time Taken: 10:12 Temperature (F): 97.6 Height (in): 69 Pulse (bpm): 101 Weight (lbs): 142 Respiratory Rate (breaths/min): 18 Body Mass Index (BMI): 21 Blood Pressure (mmHg): 125/56 Reference Range: 80 - 120 mg / dl Electronic Signature(s) Signed: 11/20/2019 8:52:48 AM By: Sandre Kitty Entered By: Sandre Kitty on 11/15/2019 10:12:51

## 2019-12-06 ENCOUNTER — Encounter (HOSPITAL_BASED_OUTPATIENT_CLINIC_OR_DEPARTMENT_OTHER): Payer: Medicare Other | Admitting: Internal Medicine

## 2019-12-12 ENCOUNTER — Other Ambulatory Visit: Payer: Self-pay

## 2019-12-12 ENCOUNTER — Emergency Department (HOSPITAL_COMMUNITY): Payer: Medicare Other

## 2019-12-12 ENCOUNTER — Emergency Department (HOSPITAL_COMMUNITY)
Admission: EM | Admit: 2019-12-12 | Discharge: 2019-12-13 | Disposition: A | Discharge status: Home / Self Care | Payer: Medicare Other | Attending: Emergency Medicine | Admitting: Emergency Medicine

## 2019-12-12 ENCOUNTER — Ambulatory Visit (HOSPITAL_COMMUNITY)
Admission: RE | Admit: 2019-12-12 | Discharge: 2019-12-12 | Disposition: A | Discharge status: Home / Self Care | Payer: Medicare Other | Source: Ambulatory Visit | Attending: Gastroenterology | Admitting: Gastroenterology

## 2019-12-12 DIAGNOSIS — K7460 Unspecified cirrhosis of liver: Secondary | ICD-10-CM | POA: Insufficient documentation

## 2019-12-12 DIAGNOSIS — I120 Hypertensive chronic kidney disease with stage 5 chronic kidney disease or end stage renal disease: Secondary | ICD-10-CM | POA: Insufficient documentation

## 2019-12-12 DIAGNOSIS — Z79899 Other long term (current) drug therapy: Secondary | ICD-10-CM | POA: Diagnosis not present

## 2019-12-12 DIAGNOSIS — R188 Other ascites: Secondary | ICD-10-CM | POA: Insufficient documentation

## 2019-12-12 DIAGNOSIS — R0602 Shortness of breath: Secondary | ICD-10-CM | POA: Insufficient documentation

## 2019-12-12 DIAGNOSIS — I2510 Atherosclerotic heart disease of native coronary artery without angina pectoris: Secondary | ICD-10-CM | POA: Insufficient documentation

## 2019-12-12 DIAGNOSIS — R079 Chest pain, unspecified: Secondary | ICD-10-CM | POA: Diagnosis not present

## 2019-12-12 DIAGNOSIS — R002 Palpitations: Secondary | ICD-10-CM | POA: Diagnosis present

## 2019-12-12 DIAGNOSIS — Z992 Dependence on renal dialysis: Secondary | ICD-10-CM | POA: Diagnosis not present

## 2019-12-12 DIAGNOSIS — F17210 Nicotine dependence, cigarettes, uncomplicated: Secondary | ICD-10-CM | POA: Insufficient documentation

## 2019-12-12 DIAGNOSIS — N186 End stage renal disease: Secondary | ICD-10-CM | POA: Diagnosis not present

## 2019-12-12 HISTORY — PX: IR PARACENTESIS: IMG2679

## 2019-12-12 LAB — BODY FLUID CELL COUNT WITH DIFFERENTIAL
Eos, Fluid: 0 %
Lymphs, Fluid: 47 %
Monocyte-Macrophage-Serous Fluid: 6 % — ABNORMAL LOW (ref 50–90)
Neutrophil Count, Fluid: 47 % — ABNORMAL HIGH (ref 0–25)
Total Nucleated Cell Count, Fluid: 384 cu mm (ref 0–1000)

## 2019-12-12 MED ORDER — HYDROMORPHONE HCL 1 MG/ML IJ SOLN
1.0000 mg | Freq: Once | INTRAMUSCULAR | Status: AC
  Administered 2019-12-13: 1 mg via INTRAVENOUS
  Filled 2019-12-12 (×2): qty 1

## 2019-12-12 MED ORDER — LIDOCAINE HCL 1 % IJ SOLN
INTRAMUSCULAR | Status: DC | PRN
  Administered 2019-12-12: 10 mL

## 2019-12-12 MED ORDER — LIDOCAINE HCL 1 % IJ SOLN
INTRAMUSCULAR | Status: AC
  Filled 2019-12-12: qty 20

## 2019-12-12 MED ORDER — AMIODARONE HCL 200 MG PO TABS
200.0000 mg | ORAL_TABLET | Freq: Every day | ORAL | Status: DC
  Filled 2019-12-12: qty 1

## 2019-12-12 NOTE — Procedures (Signed)
PROCEDURE SUMMARY:  Successful US guided paracentesis from left lateral abdomen.  Yielded 3.0 liters of bloody fluid.  No immediate complications.  Pt tolerated well.   Specimen was sent for labs.  EBL < 69mL  Docia Barrier PA-C 12/12/2019 10:34 AM

## 2019-12-12 NOTE — ED Triage Notes (Signed)
BIB [facility]. C/O chest pain, SOB, and abd. Pain. Had abdominal centesis today @ [facility] per patient and 3L was removed. Pt was supposed to have dialysis today but missed his appt. Last dialysis was on Monday. Per EMS patient heart rhythm reading VTach. 150mg  of Amiodarone was started en route via EMS. EMS VS: 108/78 BP, HR 120-140, and 95% O2 on RA.  Pt AaOx4.

## 2019-12-12 NOTE — ED Provider Notes (Signed)
Emergency Department Provider Note  I have reviewed the triage vital signs and the nursing notes.  HISTORY  Chief Complaint Shortness of Breath and Chest Pain   HPI [name] is a 59 y.o. male with multiple medical problems including end-stage renal disease on dialysis, recurrent ascites, A. fib RVR with intraventricular conduction delay, polysubstance abuse, hepatitis who presents to the emergency department today secondary to abdominal pain and palpitations along with missed dialysis.  States he had a paracentesis morning about 3 L and felt like they took off too much fluid and he be too weak if he went to dialysis.  Tonight he became short of breath with his palpitations.  He called EMS at which time he had a rapid rate with a wide rhythm concerning for V. tach according to them so they gave him amiodarone.  He recently started amiodarone twice a day for the atrial fibrillation from his cardiologist, Dr. Einar Gip.  He also states he has some pain around the area where they did the paracentesis.  Review the records it sounds like the paracentesis was bloody.  Patient has had any fevers or change in bowel movements.   No other associated or modifying symptoms.    Past Medical History:  Diagnosis Date  . Anemia   . Anxiety   . Arthritis    left shoulder  . Atherosclerosis of aorta ([place])   . Cardiomegaly   . Chest pain    DATE UNKNOWN, C/O PERIODICALLY  . Cocaine abuse ([place])   . COPD exacerbation ([place]) 08/17/2016  . Coronary artery disease    stent 02/22/17  . ESRD (end stage renal disease) on dialysis ([place])    "[name]; MWF" (07/04/2017)  . GERD (gastroesophageal reflux disease)    DATE UNKNOWN  . Hemorrhoids   . Hepatitis B, chronic ([place])   . Hepatitis C   . History of kidney stones   . Hyperkalemia   . Hypertension   . Kidney failure   . Metabolic bone disease    Patient denies  . Mitral stenosis   . Myocardial infarction ([place])   . Pneumonia   .  Pulmonary edema   . Solitary rectal ulcer syndrome 07/2017   at flex sig for rectal bleeding  . Tubular adenoma of colon     Patient Active Problem List   Diagnosis Date Noted  . Acetaminophen overdose 10/27/2019  . ESRD (end stage renal disease) ([place])   . Goals of care, counseling/discussion   . Hypoxia 10/04/2019  . Advanced care planning/counseling discussion   . Renal failure 09/26/2019  . Dyspnea 06/09/2019  . Acquired thrombophilia ([place]) 06/05/2019  . A-fib ([place]) 05/30/2019  . Atrial fibrillation with RVR ([place]) 05/29/2019  . Melena   . Pressure injury of skin 03/09/2019  . Abdominal distention   . Volume overload 12/28/2018  . Sepsis ([place]) 09/12/2018  . Atherosclerosis of native coronary artery of native heart without angina pectoris 03/11/2018  . Benign neoplasm of cecum   . Benign neoplasm of ascending colon   . Benign neoplasm of descending colon   . Benign neoplasm of rectum   . Paroxysmal atrial fibrillation ([place]) 01/23/2018  . Hx of colonic polyps 01/20/2018  . End-stage renal disease on hemodialysis ([place]) 11/21/2017  . GERD (gastroesophageal reflux disease) 11/16/2017  . Decompensated hepatic cirrhosis ([place]) 11/15/2017  . DNR (do not resuscitate)   . Palliative care by specialist   . Hyponatremia 11/04/2017  . SBP (spontaneous bacterial peritonitis) ([place]) 10/30/2017  .  Liver disease, chronic 10/30/2017  . SOB (shortness of breath)   . Abdominal pain 10/28/2017  . Upper airway cough syndrome with flattening on f/v loop 10/13/17 c/w vcd 10/17/2017  . Elevated diaphragm 10/13/2017  . Ileus ([place]) 09/29/2017  . QT prolongation 09/29/2017  . Malnutrition of moderate degree 09/29/2017  . Sinus congestion 09/03/2017  . Symptomatic anemia 09/02/2017  . Other cirrhosis of liver ([place]) 09/02/2017  . Left bundle branch block 09/02/2017  . Mitral stenosis 09/02/2017  . Hematochezia 07/15/2017  . Wide-complex tachycardia ([place])   . Endotracheally intubated   . ESRD on  dialysis ([place]) 07/04/2017  . Acute respiratory failure with hypoxia ([place]) 06/18/2017  . CKD (chronic kidney disease) stage V requiring chronic dialysis ([place]) 06/18/2017  . History of Cocaine abuse ([place]) 06/18/2017  . Hypertension 06/18/2017  . Infection of AV graft for dialysis ([place]) 06/18/2017  . Anxiety 06/18/2017  . Anemia due to chronic kidney disease 06/18/2017  . Atypical atrial flutter ([place]) 06/18/2017  . Personality disorder ([place]) 06/13/2017  . Cellulitis 06/12/2017  . Adjustment disorder with mixed anxiety and depressed mood 06/10/2017  . Suicidal ideation 06/10/2017  . Arm wound, left, sequela 06/10/2017  . Dyspnea on exertion 05/29/2017  . Tachycardia 05/29/2017  . Hyperkalemia 05/22/2017  . Acute metabolic encephalopathy   . Anemia 04/23/2017  . Ascites 04/23/2017  . COPD (chronic obstructive pulmonary disease) ([place]) 04/23/2017  . Acute on chronic respiratory failure with hypoxia ([place]) 03/25/2017  . Arrhythmia 03/25/2017  . COPD GOLD 0 with flattening on inps f/v  09/27/2016  . Essential hypertension 09/27/2016  . Fluid overload 08/30/2016  . COPD exacerbation ([place]) 08/17/2016  . Hypertensive urgency 08/17/2016  . Respiratory failure ([place]) 08/17/2016  . Problem with dialysis access ([place]) 07/23/2016  . Chronic hepatitis B ([place]) 03/05/2014  . Chronic hepatitis C without hepatic coma ([place]) 03/05/2014  . Internal hemorrhoids with bleeding, swelling and itching 03/05/2014  . Thrombocytopenia ([place]) 03/05/2014  . Chest pain 02/27/2014  . Alcohol abuse 04/14/2009  . Cigarette smoker 04/14/2009  . GANGLION CYST 04/14/2009    Past Surgical History:  Procedure Laterality Date  . A/V FISTULAGRAM Left 05/26/2017   Procedure: A/V FISTULAGRAM;  Surgeon: Conrad Stewart Manor, MD;  Location: [facility];  Service: Cardiovascular;  Laterality: Left;  . A/V FISTULAGRAM Right 11/18/2017   Procedure: A/V FISTULAGRAM - Right Arm;  Surgeon: Elam Dutch, MD;  Location: [facility];  Service: Cardiovascular;  Laterality: Right;  . APPLICATION OF WOUND VAC Left 06/14/2017   Procedure: APPLICATION OF WOUND VAC;  Surgeon: Katha Cabal, MD;  Location: [facility];  Service: Vascular;  Laterality: Left;  . AV FISTULA PLACEMENT  2012   BELIEVED WAS PLACED IN JUNE  . AV FISTULA PLACEMENT Right 08/09/2017   Procedure: Creation Right arm ARTERIOVENOUS BRACHIOCEPOHALIC FISTULA;  Surgeon: Elam Dutch, MD;  Location: [name] OR;  Service: Vascular;  Laterality: Right;  . AV FISTULA PLACEMENT Right 11/22/2017   Procedure: INSERTION OF ARTERIOVENOUS (AV) GORE-TEX GRAFT RIGHT UPPER ARM;  Surgeon: Elam Dutch, MD;  Location: [facility];  Service: Vascular;  Laterality: Right;  . BIOPSY  01/25/2018   Procedure: BIOPSY;  Surgeon: Jerene Bears, MD;  Location: [place];  Service: Gastroenterology;;  . BIOPSY  04/10/2019   Procedure: BIOPSY;  Surgeon: Jerene Bears, MD;  Location: [facility];  Service: Gastroenterology;;  . COLONOSCOPY    . COLONOSCOPY WITH PROPOFOL N/A 01/25/2018   Procedure: COLONOSCOPY WITH PROPOFOL;  Surgeon: Jerene Bears, MD;  Location: [facility]OSCOPY;  Service: Gastroenterology;  Laterality: N/A;  . CORONARY STENT INTERVENTION N/A 02/22/2017   Procedure: CORONARY STENT INTERVENTION;  Surgeon: Nigel Mormon, MD;  Location: [place];  Service: Cardiovascular;  Laterality: N/A;  . ESOPHAGOGASTRODUODENOSCOPY (EGD) WITH PROPOFOL N/A 01/25/2018   Procedure: ESOPHAGOGASTRODUODENOSCOPY (EGD) WITH PROPOFOL;  Surgeon: Jerene Bears, MD;  Location: [place];  Service: Gastroenterology;  Laterality: N/A;  . ESOPHAGOGASTRODUODENOSCOPY (EGD) WITH PROPOFOL N/A 04/10/2019   Procedure: ESOPHAGOGASTRODUODENOSCOPY (EGD) WITH PROPOFOL;  Surgeon: Jerene Bears, MD;  Location: [facility];  Service: Gastroenterology;  Laterality: N/A;  . FLEXIBLE SIGMOIDOSCOPY N/A 07/15/2017   Procedure: FLEXIBLE SIGMOIDOSCOPY;  Surgeon: Carol Ada, MD;  Location: [place];  Service: Endoscopy;  Laterality: N/A;  . HEMORRHOID BANDING    . I & D EXTREMITY Left 06/01/2017   Procedure: IRRIGATION AND DEBRIDEMENT LEFT ARM HEMATOMA WITH LIGATION OF LEFT ARM AV FISTULA;  Surgeon: Elam Dutch, MD;  Location: [facility];  Service: Vascular;  Laterality: Left;  . I & D EXTREMITY Left 06/14/2017   Procedure: IRRIGATION AND DEBRIDEMENT EXTREMITY;  Surgeon: Katha Cabal, MD;  Location: [facility];  Service: Vascular;  Laterality: Left;  . INSERTION OF DIALYSIS CATHETER  05/30/2017  . INSERTION OF DIALYSIS CATHETER N/A 05/30/2017   Procedure: INSERTION OF DIALYSIS CATHETER;  Surgeon: Elam Dutch, MD;  Location: [facility];  Service: Vascular;  Laterality: N/A;  . IR PARACENTESIS  08/30/2017  . IR PARACENTESIS  09/29/2017  . IR PARACENTESIS  10/28/2017  . IR PARACENTESIS  11/09/2017  . IR PARACENTESIS  11/16/2017  . IR PARACENTESIS  11/28/2017  . IR PARACENTESIS  12/01/2017  . IR PARACENTESIS  12/06/2017  . IR PARACENTESIS  01/03/2018  . IR PARACENTESIS  01/23/2018  . IR PARACENTESIS  02/07/2018  . IR PARACENTESIS  02/21/2018  . IR PARACENTESIS  03/06/2018  . IR PARACENTESIS  03/17/2018  . IR PARACENTESIS  04/04/2018  . IR PARACENTESIS  12/28/2018  . IR PARACENTESIS  01/08/2019  . IR PARACENTESIS  01/23/2019  . IR PARACENTESIS  02/01/2019  . IR PARACENTESIS  02/19/2019  . IR PARACENTESIS  03/01/2019  . IR PARACENTESIS  03/15/2019  . IR PARACENTESIS  04/03/2019  . IR PARACENTESIS  04/12/2019  . IR PARACENTESIS  05/01/2019  . IR PARACENTESIS  05/08/2019  . IR PARACENTESIS  05/24/2019  . IR PARACENTESIS  06/12/2019  . IR PARACENTESIS  07/09/2019  . IR PARACENTESIS  07/27/2019  . IR PARACENTESIS  08/09/2019  . IR PARACENTESIS  08/21/2019  . IR PARACENTESIS  09/17/2019  . IR PARACENTESIS  10/05/2019  . IR PARACENTESIS  10/29/2019  . IR PARACENTESIS  11/08/2019  . IR PARACENTESIS  12/12/2019  . IR RADIOLOGIST EVAL & MGMT  02/14/2018  . IR RADIOLOGIST EVAL & MGMT  02/22/2019  . LEFT  HEART CATH AND CORONARY ANGIOGRAPHY N/A 02/22/2017   Procedure: LEFT HEART CATH AND CORONARY ANGIOGRAPHY;  Surgeon: Nigel Mormon, MD;  Location: [facility] [place] [facility];  Service: Cardiovascular;  Laterality: N/A;  . LIGATION OF ARTERIOVENOUS  FISTULA Left 03/14/2354   Procedure: Plication of Left Arm Arteriovenous Fistula;  Surgeon: Elam Dutch, MD;  Location: [place];  Service: Vascular;  Laterality: Left;  . POLYPECTOMY    . POLYPECTOMY  01/25/2018   Procedure: POLYPECTOMY;  Surgeon: Jerene Bears, MD;  Location: [place];  Service: Gastroenterology;;  . REVISON OF ARTERIOVENOUS FISTULA Left 01/20/2016  Procedure: PLICATION OF DISTAL ANEURYSMAL SEGEMENT OF LEFT UPPER ARM ARTERIOVENOUS FISTULA;  Surgeon: Elam Dutch, MD;  Location: [facility];  Service: Vascular;  Laterality: Left;  . REVISON OF ARTERIOVENOUS FISTULA Left 9/32/6712   Procedure: Plication of Left Upper Arm Fistula ;  Surgeon: Waynetta Sandy, MD;  Location: [facility];  Service: Vascular;  Laterality: Left;  . SKIN GRAFT SPLIT THICKNESS LEG / FOOT Left    SKIN GRAFT SPLIT THICKNESS LEFT ARM DONOR SITE: LEFT ANTERIOR THIGH  . SKIN SPLIT GRAFT Left 07/04/2017   Procedure: SKIN GRAFT SPLIT THICKNESS LEFT ARM DONOR SITE: LEFT ANTERIOR THIGH;  Surgeon: Elam Dutch, MD;  Location: [facility];  Service: Vascular;  Laterality: Left;  . THROMBECTOMY W/ EMBOLECTOMY Left 06/05/2017   Procedure: EXPLORATION OF LEFT ARM FOR BLEEDING; OVERSEWED PROXIMAL FISTULA;  Surgeon: Angelia Mould, MD;  Location: [place];  Service: Vascular;  Laterality: Left;  . WOUND EXPLORATION Left 06/03/2017   Procedure: WOUND EXPLORATION WITH WOUND VAC APPLICATION TO LEFT ARM;  Surgeon: Angelia Mould, MD;  Location: [facility];  Service: Vascular;  Laterality: Left;    Current Outpatient Rx  . Order #: [id number] Class: Historical Med  . Order #: [id number] Class: Print  . Order #: [id number] Class: Historical Med  . Order #: [id number] Class:  Normal  . Order #: [id number] Class: Historical Med  . Order #: [id number] Class: Historical Med  . Order #: [id number] Class: Normal  . Order #: [id number] Class: Print  . Order #: [id number] Class: Normal  . Order #: [id number] Class: Historical Med  . Order #: [id number] Class: Historical Med  . Order #: [id number] Class: Historical Med  . Order #: [id number] Class: Historical Med  . Order #: [id number] Class: Historical Med  . Order #: [id number] Class: Historical Med    Allergies Tramadol, Morphine and related, Pollen extract, Acetaminophen, Aspirin, and Clonidine derivatives  Family History  Problem Relation Age of Onset  . Heart disease Mother   . Lung cancer Mother   . Heart disease Father   . Malignant hyperthermia Father   . COPD Father   . Throat cancer Sister   . Esophageal cancer Sister   . Hypertension Other   . COPD Other   . Colon cancer Neg Hx   . Colon polyps Neg Hx   . Rectal cancer Neg Hx   . Stomach cancer Neg Hx     Social History Social History   Tobacco Use  . Smoking status: Current Every Day Smoker    Packs/day: 0.50    Years: 43.00    Pack years: 21.50    Types: Cigarettes    Start date: 08/13/1973  . Smokeless tobacco: Never Used  . Tobacco comment: i dont know i just make   Substance Use Topics  . Alcohol use: Not Currently    Comment: quit drinking in 2017  . Drug use: Not Currently    Types: Marijuana, Cocaine    Comment: reports using once every 3 months, 04-06-2019 was this     Review of Systems  All other systems negative except as documented in the HPI. All pertinent positives and negatives as reviewed in the HPI. ____________________________________________  PHYSICAL EXAM:  VITAL SIGNS: ED Triage Vitals  Enc Vitals Group     BP 12/12/19 2247 (!) 128/98     Pulse Rate 12/12/19 2247 (!) 105     Resp 12/12/19 2247 20     Temp 12/12/19 2247 98.3 F (36.8 C)     Temp Source 12/12/19 2247 Oral  SpO2 12/12/19 2247 98 %     Weight 12/12/19  2249 151 lb (68.5 kg)     Height 12/12/19 2249 5\' 9"  (1.753 m)    Constitutional: Alert and oriented. Well appearing and in no acute distress. Eyes: Conjunctivae are normal. PERRL. EOMI. Head: Atraumatic. Nose: No congestion/rhinnorhea. Mouth/Throat: Mucous membranes are moist.  Oropharynx non-erythematous. Neck: No stridor.  No meningeal signs.   Cardiovascular: Normal rate, regular rhythm. Good peripheral circulation. Grossly normal heart sounds.   Respiratory: Normal respiratory effort.  No retractions. Lungs with mild wheezing, no crackles. Gastrointestinal: Soft and nontender. Mild distention.  Musculoskeletal: No lower extremity tenderness nor edema. No gross deformities of extremities. Neurologic:  Normal speech and language. No gross focal neurologic deficits are appreciated.  Skin:  Skin is warm, dry and intact. No rash noted.  ____________________________________________   LABS (all labs ordered are listed, but only abnormal results are displayed)  Labs Reviewed  CBC WITH DIFFERENTIAL/PLATELET  BASIC METABOLIC PANEL   ____________________________________________  EKG  My ECG Read Indication:palpitations EKG was personally contemporaneously reviewed by myself. Rate: 109 PR Interval: 152 QRS duration: 200 QT/QTC: 462/659 Axis: right EKG: unchanged from previous tracings, nonspecific ST and T waves changes, atrial fibrillation, IVCD Other significant findings: none  ____________________________________________  RADIOLOGY  IR Paracentesis  Result Date: 12/12/2019 INDICATION: Patient with history of cirrhosis, ascites. Request is made for diagnostic and therapeutic paracentesis. EXAM: ULTRASOUND GUIDED DIAGNOSTIC AND THERAPEUTIC PARACENTESIS MEDICATIONS: 10 mL 1% lidocaine COMPLICATIONS: None immediate. PROCEDURE: Informed written consent was obtained from the patient after a discussion of the risks, benefits and alternatives to treatment. A timeout was performed  prior to the initiation of the procedure. Initial ultrasound scanning demonstrates a moderate amount of ascites within the left lateral abdomen. The left lateral abdomen was prepped and draped in the usual sterile fashion. 1% lidocaine with epinephrine was used for local anesthesia. Following this, a 19 gauge, 7-cm, Yueh catheter was introduced. After about 400 mL were removed the catheter became lodged against bowel and flow could not be restored despite multiple attempts to dislodge the catheter. The Yueh catheter was removed and a Safe-T-Centesis was introduced. The paracentesis was performed. The catheter was removed and a dressing was applied. The patient tolerated the procedure well without immediate post procedural complication. FINDINGS: A total of approximately 3.0 liters of bloody fluid was removed. Samples were sent to the laboratory as requested by the clinical team. IMPRESSION: Successful ultrasound-guided diagnostic and therapeutic paracentesis yielding 3.0 liters of peritoneal fluid. Read by: Brynda Greathouse PA-C Electronically Signed   By: Lucrezia Europe M.D.   On: 12/12/2019 10:48   ____________________________________________  PROCEDURES  Procedure(s) performed:   Procedures ____________________________________________  INITIAL IMPRESSION / ASSESSMENT AND PLAN / ED COURSE   This patient presents to the ED for concern of abdominal pain, palpitations and missed dialysis, this involves an extensive number of treatment options, and is a complaint that carries with it a high risk of complications and morbidity.  The differential diagnosis includes sbp, post procedure pain, fluid overload.     Lab Tests:   I Ordered, reviewed, and interpreted labs, which included cbc/bmp which showed hyperkalemia.   Discussed with nephrology, recommend lokelma. [facility] because he needs an isolation room, but Dr. Hollie Salk stated she thought that all those were full for the day. Will  plan to get a hold of them later in the morning. If able to get dialysis there today, will dc for same. If not,  will get dialysis here.   Medicines ordered:   I ordered medication dilaudid  For pain and lokelma for hyper-k.  Imaging Studies ordered:   I independently visualized and interpreted imaging portable CXR which showed no significant change  Additional history obtained:   Additional history obtained from EMS  Previous records obtained and reviewed in Epic  Consultations Obtained:   I consulted nephrology (see recs above)  and discussed lab and imaging findings  Reevaluation:  After the interventions stated above, I reevaluated the patient and found improved pain  Critical Interventions: lokelma for hyperkalemia  Ultimate Disposition:  Patient with heart rates varying between 105 and 130 consistent with previous history.  Resting comfortably without complaints.   Has not had amiodarone and diltiazem yet suspect these will help with that.  Dr. Hollie Salk verified with his dialysis center that he can get dialysis today at 1230.  Patient is amenable to that plan.  Plan to get him his home medications and assistance with transportation and is stable for discharge to go to dialysis later today.   A medical screening exam was performed and I feel the patient has had an appropriate workup for their chief complaint at this time and likelihood of emergent condition existing is low. They have been counseled on decision, discharge, follow up and which symptoms necessitate immediate return to the emergency department. They or their family verbally stated understanding and agreement with plan and discharged in stable condition.   ____________________________________________  FINAL CLINICAL IMPRESSION(S) / ED DIAGNOSES  Final diagnoses:  Palpitations    MEDICATIONS GIVEN DURING THIS VISIT:  Medications  amiodarone (PACERONE) tablet 200 mg (has no administration in time range)    HYDROmorphone (DILAUDID) injection 1 mg (has no administration in time range)    NEW OUTPATIENT MEDICATIONS STARTED DURING THIS VISIT:  New Prescriptions   No medications on file    Note:  This note was prepared with assistance of Dragon voice recognition software. Occasional wrong-word or sound-a-like substitutions may have occurred due to the inherent limitations of voice recognition software.   Laysa Kimmey, Corene Cornea, MD 12/13/19 [phone]

## 2019-12-13 ENCOUNTER — Other Ambulatory Visit: Payer: Medicare Other | Admitting: Hospice

## 2019-12-13 DIAGNOSIS — R002 Palpitations: Secondary | ICD-10-CM | POA: Diagnosis not present

## 2019-12-13 DIAGNOSIS — N186 End stage renal disease: Secondary | ICD-10-CM

## 2019-12-13 DIAGNOSIS — Z515 Encounter for palliative care: Secondary | ICD-10-CM

## 2019-12-13 LAB — CBC WITH DIFFERENTIAL/PLATELET
Abs Immature Granulocytes: 0.02 10*3/uL (ref 0.00–0.07)
Basophils Absolute: 0.1 10*3/uL (ref 0.0–0.1)
Basophils Relative: 1 %
Eosinophils Absolute: 0.1 10*3/uL (ref 0.0–0.5)
Eosinophils Relative: 1 %
HCT: 24.1 % — ABNORMAL LOW (ref 39.0–52.0)
Hemoglobin: 8.3 g/dL — ABNORMAL LOW (ref 13.0–17.0)
Immature Granulocytes: 0 %
Lymphocytes Relative: 13 %
Lymphs Abs: 1.3 10*3/uL (ref 0.7–4.0)
MCH: 28.3 pg (ref 26.0–34.0)
MCHC: 34.4 g/dL (ref 30.0–36.0)
MCV: 82.3 fL (ref 80.0–100.0)
Monocytes Absolute: 0.8 10*3/uL (ref 0.1–1.0)
Monocytes Relative: 8 %
Neutro Abs: 7.8 10*3/uL — ABNORMAL HIGH (ref 1.7–7.7)
Neutrophils Relative %: 77 %
Platelets: 182 10*3/uL (ref 150–400)
RBC: 2.93 MIL/uL — ABNORMAL LOW (ref 4.22–5.81)
RDW: 15.9 % — ABNORMAL HIGH (ref 11.5–15.5)
WBC: 10.1 10*3/uL (ref 4.0–10.5)
nRBC: 0 % (ref 0.0–0.2)

## 2019-12-13 LAB — BASIC METABOLIC PANEL
Anion gap: 17 — ABNORMAL HIGH (ref 5–15)
BUN: 52 mg/dL — ABNORMAL HIGH (ref 6–20)
CO2: 26 mmol/L (ref 22–32)
Calcium: 10 mg/dL (ref 8.9–10.3)
Chloride: 89 mmol/L — ABNORMAL LOW (ref 98–111)
Creatinine, Ser: 6.28 mg/dL — ABNORMAL HIGH (ref 0.61–1.24)
GFR calc Af Amer: 10 mL/min — ABNORMAL LOW (ref >60)
GFR calc non Af Amer: 9 mL/min — ABNORMAL LOW (ref >60)
Glucose, Bld: 110 mg/dL — ABNORMAL HIGH (ref 70–99)
Potassium: 6 mmol/L — ABNORMAL HIGH (ref 3.5–5.1)
Sodium: 132 mmol/L — ABNORMAL LOW (ref 135–145)

## 2019-12-13 LAB — PATHOLOGIST SMEAR REVIEW

## 2019-12-13 MED ORDER — DILTIAZEM HCL ER COATED BEADS 240 MG PO CP24
240.0000 mg | ORAL_CAPSULE | Freq: Once | ORAL | Status: AC
  Administered 2019-12-13: 240 mg via ORAL
  Filled 2019-12-13: qty 1

## 2019-12-13 MED ORDER — SODIUM ZIRCONIUM CYCLOSILICATE 10 G PO PACK
10.0000 g | PACK | Freq: Once | ORAL | Status: AC
  Administered 2019-12-13: 10 g via ORAL
  Filled 2019-12-13: qty 1

## 2019-12-13 NOTE — Progress Notes (Signed)
[facility] Consult Note Telephone: [phone]  Fax: [phone] PATIENT NAME: [name] DOB: 03/12/1961 MRN: [id number]  PRIMARY CARE PROVIDER:   [name], FNP  REFERRING PROVIDER:  [name], FNP [place],  [place] [address]  RESPONSIBLE PARTY:Self [phone]   RECOMMENDATIONS/PLAN:  Advance Care Planning/Goals of Care: Visit consisted of building trust and follow up on palliative care. He remains a DNR;Goals of care include to maximize quality of life and symptom management.  Visit consisted of counseling and education dealing with the complex and emotionally intense issues of symptom management and palliative care in the setting of serious and potentially life-threatening illness. Palliative care team will continue to support patient, patient's family, and medical team.  Symptom management:Patient with recent hospitalization yesterday for Palpitations abdominal pain.  He denies palpitations and abdominal pain during visit.  Chart review shows he had paracentesis yesterday; patient plans to go for dialysis on Friday.  He said "they took out so much blood and fluid yesterday".  Patient with multiple hospitalizations since last visit, reasons ranging from abdominal pain to ascites of liver, ESRD. He continues on Dialysis for ESRD and also continues with paracentesis. Education reinforced on adhering to dialysis schedules and appointments with his specialist, considering his multiple health issues. He verbalized understanding and said he will continue to try and comply. He reported he now has Home health services - nursing aid come to help at home.  Patient reported he was running low on his medications: Protonix, Zofran, gabapentin.  NP as showed him she will reach out to his PCP.  NP called PCPs office and spoke with Leanna Sato giving him the list of medications needing refill. Leanna Sato said he will  follow through with PCP and pharmacy.  NP called patient and left a voicemail updating him. Follow IB:[id number] care will continue to follow patient for goals of care clarification and symptom management I spent63minutes providing this consultation; time includes chart review, provider coordination and  documentation.More than 50% of the time in this consultation was spent on coordinating communication  HISTORY OF PRESENT ILLNESS:[name] a 59 y.o.year oldmalewith multiple medical problems including ESRD on HD, cirrhosis of the liver, Hep C, CAD, pulmonary HTN, CHF,COPD.Palliative Care was asked to help address goals of care  CODE STATUS: DNR  PPS: 60% HOSPICE ELIGIBILITY/DIAGNOSIS: TBD  PAST MEDICAL HISTORY:  Past Medical History:  Diagnosis Date  . Anemia   . Anxiety   . Arthritis    left shoulder  . Atherosclerosis of aorta ([place])   . Cardiomegaly   . Chest pain    DATE UNKNOWN, C/O PERIODICALLY  . Cocaine abuse ([place])   . COPD exacerbation ([place]) 08/17/2016  . Coronary artery disease    stent 02/22/17  . ESRD (end stage renal disease) on dialysis ([facility])    "[name]; MWF" (07/04/2017)  . GERD (gastroesophageal reflux disease)    DATE UNKNOWN  . Hemorrhoids   . Hepatitis B, chronic ([place])   . Hepatitis C   . History of kidney stones   . Hyperkalemia   . Hypertension   . Kidney failure   . Metabolic bone disease    Patient denies  . Mitral stenosis   . Myocardial infarction ([place])   . Pneumonia   . Pulmonary edema   . Solitary rectal ulcer syndrome 07/2017   at [facility] for rectal bleeding  . Tubular adenoma of colon     SOCIAL  HX:  Social History   Tobacco Use  . Smoking status: Current Every Day Smoker    Packs/day: 0.50    Years: 43.00    Pack years: 21.50    Types: Cigarettes    Start date: 08/13/1973  . Smokeless tobacco: Never Used  . Tobacco comment: i dont know i just make   Substance Use Topics  . Alcohol use: Not  Currently    Comment: quit drinking in 2017    ALLERGIES:  Allergies  Allergen Reactions  . Tramadol Itching and Other (See Comments)  . Morphine And Related Other (See Comments)    Stomach pain  . Pollen Extract     Other reaction(s): Sneezing (finding)  . Acetaminophen Nausea Only    Stomach ache Other reaction(s): Other (See Comments) Stomach ache  . Aspirin Other (See Comments) and Itching    STOMACH PAIN Other reaction(s): Unknown STOMACH PAIN  . Clonidine Derivatives Itching     PERTINENT MEDICATIONS:  Outpatient Encounter Medications as of 12/13/2019  Medication Sig  . albuterol (VENTOLIN HFA) 108 (90 Base) MCG/ACT inhaler Inhale 2 puffs into the lungs every 6 (six) hours as needed for wheezing or shortness of breath.  Marland Kitchen amiodarone (PACERONE) 200 MG tablet Take 1 tablet (200 mg total) by mouth 2 (two) times daily.  . budesonide-formoterol (SYMBICORT) 160-4.5 MCG/ACT inhaler Inhale 2 puffs into the lungs 2 (two) times daily.  Marland Kitchen diltiazem (CARDIZEM CD) 240 MG 24 hr capsule TAKE 1 CAPSULE BY MOUTH EVERY DAY (Patient taking differently: Take 240 mg by mouth daily. )  . hydrALAZINE (APRESOLINE) 100 MG tablet Take 100 mg by mouth 2 (two) times daily.  . hydrOXYzine (ATARAX/VISTARIL) 25 MG tablet Take 25 mg by mouth 2 (two) times daily as needed for itching.   . lactulose (CHRONULAC) 10 GM/15ML solution Take 45 mLs (30 g total) by mouth daily as needed for mild constipation.  . lidocaine (LIDODERM) 5 % Place 1 patch onto the skin daily. Remove & Discard patch within 12 hours or as directed by MD  . metoprolol tartrate (LOPRESSOR) 100 MG tablet Take 1 tablet (100 mg total) by mouth 2 (two) times daily.  . montelukast (SINGULAIR) 10 MG tablet Take 10 mg by mouth every evening.   . ondansetron (ZOFRAN) 4 MG tablet Take 4 mg by mouth every 4 (four) hours as needed for nausea or vomiting.  . Oxycodone HCl 10 MG TABS Take 10 mg by mouth 3 (three) times daily as needed (pain).   .  pantoprazole (PROTONIX) 40 MG tablet Take 40 mg by mouth daily.  . sevelamer carbonate (RENVELA) 800 MG tablet Take 1,600-3,200 mg by mouth See admin instructions. Take 4 tablets (3200 mg) by mouth 3 times daily with meals and 2 tablets (1600 mg) with snacks  . zolpidem (AMBIEN) 10 MG tablet Take 10 mg by mouth at bedtime as needed for sleep.   No facility-administered encounter medications on file as of 12/13/2019.    PHYSICAL EXAM/ROS  General: NAD, frail appearing, thin, cooperative Cardiovascular: regular rate and rhythm/denies chest pain Pulmonary: clear ant/post fields; no chest retractions; normal respiratory effort Abdomen: ascites, nontender, + bowel sounds GU: no suprapubic tenderness Extremities: no edema in bilateral lower extremities, no joint deformities Skin: no rashes to exposed skin Neurological: Weakness but otherwise nonfocal  Teodoro Spray, NP

## 2019-12-18 ENCOUNTER — Emergency Department (HOSPITAL_COMMUNITY)
Admission: EM | Admit: 2019-12-18 | Discharge: 2019-12-19 | Disposition: A | Discharge status: Home / Self Care | Payer: Medicare Other | Source: Home / Self Care | Attending: Emergency Medicine | Admitting: Emergency Medicine

## 2019-12-18 ENCOUNTER — Emergency Department (HOSPITAL_COMMUNITY): Payer: Medicare Other

## 2019-12-18 ENCOUNTER — Other Ambulatory Visit: Payer: Self-pay

## 2019-12-18 DIAGNOSIS — R188 Other ascites: Secondary | ICD-10-CM

## 2019-12-18 DIAGNOSIS — A419 Sepsis, unspecified organism: Secondary | ICD-10-CM | POA: Diagnosis not present

## 2019-12-18 DIAGNOSIS — R0989 Other specified symptoms and signs involving the circulatory and respiratory systems: Secondary | ICD-10-CM | POA: Diagnosis not present

## 2019-12-18 DIAGNOSIS — R0602 Shortness of breath: Secondary | ICD-10-CM

## 2019-12-18 DIAGNOSIS — R1084 Generalized abdominal pain: Secondary | ICD-10-CM

## 2019-12-18 LAB — CBC
HCT: 26.9 % — ABNORMAL LOW (ref 39.0–52.0)
Hemoglobin: 9 g/dL — ABNORMAL LOW (ref 13.0–17.0)
MCH: 27.9 pg (ref 26.0–34.0)
MCHC: 33.5 g/dL (ref 30.0–36.0)
MCV: 83.3 fL (ref 80.0–100.0)
Platelets: 235 10*3/uL (ref 150–400)
RBC: 3.23 MIL/uL — ABNORMAL LOW (ref 4.22–5.81)
RDW: 16.3 % — ABNORMAL HIGH (ref 11.5–15.5)
WBC: 9.7 10*3/uL (ref 4.0–10.5)
nRBC: 0 % (ref 0.0–0.2)

## 2019-12-18 LAB — COMPREHENSIVE METABOLIC PANEL
ALT: 43 U/L (ref 0–44)
AST: 63 U/L — ABNORMAL HIGH (ref 15–41)
Albumin: 2.6 g/dL — ABNORMAL LOW (ref 3.5–5.0)
Alkaline Phosphatase: 317 U/L — ABNORMAL HIGH (ref 38–126)
Anion gap: 20 — ABNORMAL HIGH (ref 5–15)
BUN: 45 mg/dL — ABNORMAL HIGH (ref 6–20)
CO2: 28 mmol/L (ref 22–32)
Calcium: 10 mg/dL (ref 8.9–10.3)
Chloride: 87 mmol/L — ABNORMAL LOW (ref 98–111)
Creatinine, Ser: 5.97 mg/dL — ABNORMAL HIGH (ref 0.61–1.24)
GFR calc Af Amer: 11 mL/min — ABNORMAL LOW (ref >60)
GFR calc non Af Amer: 10 mL/min — ABNORMAL LOW (ref >60)
Glucose, Bld: 95 mg/dL (ref 70–99)
Potassium: 5.6 mmol/L — ABNORMAL HIGH (ref 3.5–5.1)
Sodium: 135 mmol/L (ref 135–145)
Total Bilirubin: 0.7 mg/dL (ref 0.3–1.2)
Total Protein: 6.9 g/dL (ref 6.5–8.1)

## 2019-12-18 LAB — MAGNESIUM: Magnesium: 2.2 mg/dL (ref 1.7–2.4)

## 2019-12-18 LAB — LIPASE, BLOOD: Lipase: 37 U/L (ref 11–51)

## 2019-12-18 LAB — TROPONIN I (HIGH SENSITIVITY)
Troponin I (High Sensitivity): 34 ng/L — ABNORMAL HIGH (ref <18)
Troponin I (High Sensitivity): 34 ng/L — ABNORMAL HIGH (ref <18)

## 2019-12-18 LAB — AMMONIA: Ammonia: 64 umol/L — ABNORMAL HIGH (ref 9–35)

## 2019-12-18 MED ORDER — HYDROMORPHONE HCL 1 MG/ML IJ SOLN
1.0000 mg | Freq: Once | INTRAMUSCULAR | Status: AC
  Administered 2019-12-18: 1 mg via INTRAVENOUS
  Filled 2019-12-18: qty 1

## 2019-12-18 MED ORDER — ALBUTEROL SULFATE HFA 108 (90 BASE) MCG/ACT IN AERS
2.0000 | INHALATION_SPRAY | Freq: Once | RESPIRATORY_TRACT | Status: AC
  Administered 2019-12-18: 2 via RESPIRATORY_TRACT
  Filled 2019-12-18: qty 6.7

## 2019-12-18 MED ORDER — SODIUM ZIRCONIUM CYCLOSILICATE 10 G PO PACK
10.0000 g | PACK | Freq: Once | ORAL | Status: AC
  Administered 2019-12-18: 10 g via ORAL
  Filled 2019-12-18: qty 1

## 2019-12-18 NOTE — ED Notes (Signed)
Pt 96% on room air. Placed on 2 L O2 for comfort

## 2019-12-18 NOTE — Discharge Instructions (Signed)
It is very important that you go to your dialysis session tomorrow and follow-up with your nephrologist and primary care physician afterwards.  Monitor your condition carefully and do not hesitate to return here for concerning changes.

## 2019-12-18 NOTE — ED Triage Notes (Signed)
Pt BIB [facility] c/o abdominal pain, SOB, and fatigue. Denies CP.   States similar symptoms previously that required paracentesis. Pt. Dialysis. States he has been complaint with treatment.

## 2019-12-18 NOTE — ED Provider Notes (Signed)
[facility] EMERGENCY DEPARTMENT Provider Note   CSN: [id number] Arrival date & time: 12/18/19  1925     History Chief Complaint  Patient presents with  . Shortness of Breath  . Abdominal Pain  . Fatigue    [name] is a 59 y.o. male with a past medical history significant for anemia, anxiety, cardiomegaly, cocaine abuse, COPD, CAD, ESRD on dialysis Monday/Wednesday/Friday with last dialysis yesterday, GERD, hepatitis B, hepatitis C, hypertension, and history of MI who presents to the ED due to shortness of breath and abdominal pain for the past "few weeks".  Patient notes his abdominal pain is worse around his umbilicus and associated with abdominal distention.  He has periodic paracentesises performed.  His last paracentesis was on Monday where 3 L were removed.  Abdominal pain associated with nausea and 1 episodes of nonbloody, nonbilious emesis today. Patient also admits to acute on chronic shortness of breath.  Shortness of breath is better when lying down.  Denies associated cough, fever, and chills.  Denies sick contacts and Covid exposures.  His last dialysis was Monday.  He has not tried anything for his symptoms.  Denies diarrhea or change in bowel movements.  Denies chest pain and lower extremity edema.  Patient sees Dr. Einar Gip with cardiology. Patient notes he has had similar symptoms in the past which required a paracentesis.   Chart reviewed. Patient was seen in the ED on 6/2 for similar symptoms and found to be hyperkalemic. Patient also had a paracentesis that day which yielded 3L of bloody fluid.   History obtained from patient and past medical records. No interpreter used during encounter.      Past Medical History:  Diagnosis Date  . Anemia   . Anxiety   . Arthritis    left shoulder  . Atherosclerosis of aorta ([place])   . Cardiomegaly   . Chest pain    DATE UNKNOWN, C/O PERIODICALLY  . Cocaine abuse ([place])   . COPD exacerbation ([place])  08/17/2016  . Coronary artery disease    stent 02/22/17  . ESRD (end stage renal disease) on dialysis ([place])    "[name]; MWF" (07/04/2017)  . GERD (gastroesophageal reflux disease)    DATE UNKNOWN  . Hemorrhoids   . Hepatitis B, chronic ([place])   . Hepatitis C   . History of kidney stones   . Hyperkalemia   . Hypertension   . Kidney failure   . Metabolic bone disease    Patient denies  . Mitral stenosis   . Myocardial infarction ([place])   . Pneumonia   . Pulmonary edema   . Solitary rectal ulcer syndrome 07/2017   at flex sig for rectal bleeding  . Tubular adenoma of colon     Patient Active Problem List   Diagnosis Date Noted  . Acetaminophen overdose 10/27/2019  . ESRD (end stage renal disease) ([place])   . Goals of care, counseling/discussion   . Hypoxia 10/04/2019  . Advanced care planning/counseling discussion   . Renal failure 09/26/2019  . Dyspnea 06/09/2019  . Acquired thrombophilia ([place]) 06/05/2019  . A-fib ([place]) 05/30/2019  . Atrial fibrillation with RVR ([place]) 05/29/2019  . Melena   . Pressure injury of skin 03/09/2019  . Abdominal distention   . Volume overload 12/28/2018  . Sepsis ([place]) 09/12/2018  . Atherosclerosis of native coronary artery of native heart without angina pectoris 03/11/2018  . Benign neoplasm of cecum   . Benign neoplasm of ascending colon   .  Benign neoplasm of descending colon   . Benign neoplasm of rectum   . Paroxysmal atrial fibrillation ([place]) 01/23/2018  . Hx of colonic polyps 01/20/2018  . End-stage renal disease on hemodialysis ([place]) 11/21/2017  . GERD (gastroesophageal reflux disease) 11/16/2017  . Decompensated hepatic cirrhosis ([place]) 11/15/2017  . DNR (do not resuscitate)   . Palliative care by specialist   . Hyponatremia 11/04/2017  . SBP (spontaneous bacterial peritonitis) ([place]) 10/30/2017  . Liver disease, chronic 10/30/2017  . SOB (shortness of breath)   . Abdominal pain 10/28/2017  . Upper airway cough syndrome with  flattening on f/v loop 10/13/17 c/w vcd 10/17/2017  . Elevated diaphragm 10/13/2017  . Ileus ([place]) 09/29/2017  . QT prolongation 09/29/2017  . Malnutrition of moderate degree 09/29/2017  . Sinus congestion 09/03/2017  . Symptomatic anemia 09/02/2017  . Other cirrhosis of liver ([place]) 09/02/2017  . Left bundle branch block 09/02/2017  . Mitral stenosis 09/02/2017  . Hematochezia 07/15/2017  . Wide-complex tachycardia ([place])   . Endotracheally intubated   . ESRD on dialysis ([place]) 07/04/2017  . Acute respiratory failure with hypoxia ([place]) 06/18/2017  . CKD (chronic kidney disease) stage V requiring chronic dialysis ([place]) 06/18/2017  . History of Cocaine abuse ([place]) 06/18/2017  . Hypertension 06/18/2017  . Infection of AV graft for dialysis ([place]) 06/18/2017  . Anxiety 06/18/2017  . Anemia due to chronic kidney disease 06/18/2017  . Atypical atrial flutter ([place]) 06/18/2017  . Personality disorder ([place]) 06/13/2017  . Cellulitis 06/12/2017  . Adjustment disorder with mixed anxiety and depressed mood 06/10/2017  . Suicidal ideation 06/10/2017  . Arm wound, left, sequela 06/10/2017  . Dyspnea on exertion 05/29/2017  . Tachycardia 05/29/2017  . Hyperkalemia 05/22/2017  . Acute metabolic encephalopathy   . Anemia 04/23/2017  . Ascites 04/23/2017  . COPD (chronic obstructive pulmonary disease) ([place]) 04/23/2017  . Acute on chronic respiratory failure with hypoxia ([place]) 03/25/2017  . Arrhythmia 03/25/2017  . COPD GOLD 0 with flattening on inps f/v  09/27/2016  . Essential hypertension 09/27/2016  . Fluid overload 08/30/2016  . COPD exacerbation ([place]) 08/17/2016  . Hypertensive urgency 08/17/2016  . Respiratory failure ([place]) 08/17/2016  . Problem with dialysis access ([place]) 07/23/2016  . Chronic hepatitis B ([place]) 03/05/2014  . Chronic hepatitis C without hepatic coma ([place]) 03/05/2014  . Internal hemorrhoids with bleeding, swelling and itching 03/05/2014  . Thrombocytopenia ([place]) 03/05/2014    . Chest pain 02/27/2014  . Alcohol abuse 04/14/2009  . Cigarette smoker 04/14/2009  . GANGLION CYST 04/14/2009    Past Surgical History:  Procedure Laterality Date  . A/V FISTULAGRAM Left 05/26/2017   Procedure: A/V FISTULAGRAM;  Surgeon: Conrad Taopi, MD;  Location: [facility];  Service: Cardiovascular;  Laterality: Left;  . A/V FISTULAGRAM Right 11/18/2017   Procedure: A/V FISTULAGRAM - Right Arm;  Surgeon: Elam Dutch, MD;  Location: [facility];  Service: Cardiovascular;  Laterality: Right;  . APPLICATION OF WOUND VAC Left 06/14/2017   Procedure: APPLICATION OF WOUND VAC;  Surgeon: Katha Cabal, MD;  Location: [facility];  Service: Vascular;  Laterality: Left;  . AV FISTULA PLACEMENT  2012   BELIEVED WAS PLACED IN JUNE  . AV FISTULA PLACEMENT Right 08/09/2017   Procedure: Creation Right arm ARTERIOVENOUS BRACHIOCEPOHALIC FISTULA;  Surgeon: Elam Dutch, MD;  Location: [facility];  Service: Vascular;  Laterality: Right;  . AV FISTULA PLACEMENT Right 11/22/2017   Procedure: INSERTION OF ARTERIOVENOUS (AV) GORE-TEX GRAFT RIGHT UPPER  ARM;  Surgeon: Elam Dutch, MD;  Location: [facility];  Service: Vascular;  Laterality: Right;  . BIOPSY  01/25/2018   Procedure: BIOPSY;  Surgeon: Jerene Bears, MD;  Location: [facility];  Service: Gastroenterology;;  . BIOPSY  04/10/2019   Procedure: BIOPSY;  Surgeon: Jerene Bears, MD;  Location: [facility];  Service: Gastroenterology;;  . COLONOSCOPY    . COLONOSCOPY WITH PROPOFOL N/A 01/25/2018   Procedure: COLONOSCOPY WITH PROPOFOL;  Surgeon: Jerene Bears, MD;  Location: [facility];  Service: Gastroenterology;  Laterality: N/A;  . CORONARY STENT INTERVENTION N/A 02/22/2017   Procedure: CORONARY STENT INTERVENTION;  Surgeon: Nigel Mormon, MD;  Location: [facility];  Service: Cardiovascular;  Laterality: N/A;  . ESOPHAGOGASTRODUODENOSCOPY (EGD) WITH PROPOFOL N/A 01/25/2018   Procedure:  ESOPHAGOGASTRODUODENOSCOPY (EGD) WITH PROPOFOL;  Surgeon: Jerene Bears, MD;  Location: [facility];  Service: Gastroenterology;  Laterality: N/A;  . ESOPHAGOGASTRODUODENOSCOPY (EGD) WITH PROPOFOL N/A 04/10/2019   Procedure: ESOPHAGOGASTRODUODENOSCOPY (EGD) WITH PROPOFOL;  Surgeon: Jerene Bears, MD;  [facility]: [facility];  Service: Gastroenterology;  Laterality: N/A;  . FLEXIBLE SIGMOIDOSCOPY N/A 07/15/2017   Procedure: FLEXIBLE SIGMOIDOSCOPY;  Surgeon: Carol Ada, MD;  Location: [facility];  Service: Endoscopy;  Laterality: N/A;  . HEMORRHOID BANDING    . I & D EXTREMITY Left 06/01/2017   Procedure: IRRIGATION AND DEBRIDEMENT LEFT ARM HEMATOMA WITH LIGATION OF LEFT ARM AV FISTULA;  Surgeon: Elam Dutch, MD;  Location: [facility];  Service: Vascular;  Laterality: Left;  . I & D EXTREMITY Left 06/14/2017   Procedure: IRRIGATION AND DEBRIDEMENT EXTREMITY;  Surgeon: Katha Cabal, MD;  Location: [facility];  Service: Vascular;  Laterality: Left;  . INSERTION OF DIALYSIS CATHETER  05/30/2017  . INSERTION OF DIALYSIS CATHETER N/A 05/30/2017   Procedure: INSERTION OF DIALYSIS CATHETER;  Surgeon: Elam Dutch, MD;  Location: [facility];  Service: Vascular;  Laterality: N/A;  . IR PARACENTESIS  08/30/2017  . IR PARACENTESIS  09/29/2017  . IR PARACENTESIS  10/28/2017  . IR PARACENTESIS  11/09/2017  . IR PARACENTESIS  11/16/2017  . IR PARACENTESIS  11/28/2017  . IR PARACENTESIS  12/01/2017  . IR PARACENTESIS  12/06/2017  . IR PARACENTESIS  01/03/2018  . IR PARACENTESIS  01/23/2018  . IR PARACENTESIS  02/07/2018  . IR PARACENTESIS  02/21/2018  . IR PARACENTESIS  03/06/2018  . IR PARACENTESIS  03/17/2018  . IR PARACENTESIS  04/04/2018  . IR PARACENTESIS  12/28/2018  . IR PARACENTESIS  01/08/2019  . IR PARACENTESIS  01/23/2019  . IR PARACENTESIS  02/01/2019  . IR PARACENTESIS  02/19/2019  . IR PARACENTESIS  03/01/2019  . IR PARACENTESIS  03/15/2019  . IR PARACENTESIS  04/03/2019  . IR PARACENTESIS  04/12/2019    . IR PARACENTESIS  05/01/2019  . IR PARACENTESIS  05/08/2019  . IR PARACENTESIS  05/24/2019  . IR PARACENTESIS  06/12/2019  . IR PARACENTESIS  07/09/2019  . IR PARACENTESIS  07/27/2019  . IR PARACENTESIS  08/09/2019  . IR PARACENTESIS  08/21/2019  . IR PARACENTESIS  09/17/2019  . IR PARACENTESIS  10/05/2019  . IR PARACENTESIS  10/29/2019  . IR PARACENTESIS  11/08/2019  . IR PARACENTESIS  12/12/2019  . IR RADIOLOGIST EVAL & MGMT  02/14/2018  . IR RADIOLOGIST EVAL & MGMT  02/22/2019  . LEFT HEART CATH AND CORONARY ANGIOGRAPHY N/A 02/22/2017   Procedure: LEFT HEART CATH AND CORONARY ANGIOGRAPHY;  Surgeon: Nigel Mormon, MD;  Location:  [facility];  Service: Cardiovascular;  Laterality: N/A;  . LIGATION OF ARTERIOVENOUS  FISTULA Left 12/12/7856   Procedure: Plication of Left Arm Arteriovenous Fistula;  Surgeon: Elam Dutch, MD;  Location: [facility];  Service: Vascular;  Laterality: Left;  . POLYPECTOMY    . POLYPECTOMY  01/25/2018   Procedure: POLYPECTOMY;  Surgeon: Jerene Bears, MD;  Location: [facility];  Service: Gastroenterology;;  . REVISON OF ARTERIOVENOUS FISTULA Left 8/50/2774   Procedure: PLICATION OF DISTAL ANEURYSMAL SEGEMENT OF LEFT UPPER ARM ARTERIOVENOUS FISTULA;  Surgeon: Elam Dutch, MD;  Location: [facility];  Service: Vascular;  Laterality: Left;  . REVISON OF ARTERIOVENOUS FISTULA Left 08/08/7865   Procedure: Plication of Left Upper Arm Fistula ;  Surgeon: Waynetta Sandy, MD;  Location: [facility];  Service: Vascular;  Laterality: Left;  . SKIN GRAFT SPLIT THICKNESS LEG / FOOT Left    SKIN GRAFT SPLIT THICKNESS LEFT ARM DONOR SITE: LEFT ANTERIOR THIGH  . SKIN SPLIT GRAFT Left 07/04/2017   Procedure: SKIN GRAFT SPLIT THICKNESS LEFT ARM DONOR SITE: LEFT ANTERIOR THIGH;  Surgeon: Elam Dutch, MD;  Location: [facility];  Service: Vascular;  Laterality: Left;  . THROMBECTOMY W/ EMBOLECTOMY Left 06/05/2017   Procedure: EXPLORATION OF LEFT ARM FOR BLEEDING; OVERSEWED  PROXIMAL FISTULA;  Surgeon: Angelia Mould, MD;  Location: [facility];  Service: Vascular;  Laterality: Left;  . WOUND EXPLORATION Left 06/03/2017   Procedure: WOUND EXPLORATION WITH WOUND VAC APPLICATION TO LEFT ARM;  Surgeon: Angelia Mould, MD;  Location: [facility] OR;  Service: Vascular;  Laterality: Left;       Family History  Problem Relation Age of Onset  . Heart disease Mother   . Lung cancer Mother   . Heart disease Father   . Malignant hyperthermia Father   . COPD Father   . Throat cancer Sister   . Esophageal cancer Sister   . Hypertension Other   . COPD Other   . Colon cancer Neg Hx   . Colon polyps Neg Hx   . Rectal cancer Neg Hx   . Stomach cancer Neg Hx     Social History   Tobacco Use  . Smoking status: Current Every Day Smoker    Packs/day: 0.50    Years: 43.00    Pack years: 21.50    Types: Cigarettes    Start date: 08/13/1973  . Smokeless tobacco: Never Used  . Tobacco comment: i dont know i just make   Substance Use Topics  . Alcohol use: Not Currently    Comment: quit drinking in 2017  . Drug use: Not Currently    Types: Marijuana, Cocaine    Comment: reports using once every 3 months, 04-06-2019 was this     Home Medications Prior to Admission medications   Medication Sig Start Date End Date Taking? Authorizing Provider  albuterol (VENTOLIN HFA) 108 (90 Base) MCG/ACT inhaler Inhale 2 puffs into the lungs every 6 (six) hours as needed for wheezing or shortness of breath.    [provider]  amiodarone (PACERONE) 200 MG tablet Take 1 tablet (200 mg total) by mouth 2 (two) times daily. 11/26/19 12/26/19  McDonald, Mia A, PA-C  budesonide-formoterol (SYMBICORT) 160-4.5 MCG/ACT inhaler Inhale 2 puffs into the lungs 2 (two) times daily.    [provider]  diltiazem (CARDIZEM CD) 240 MG 24 hr capsule TAKE 1 CAPSULE BY MOUTH EVERY DAY Patient taking differently: Take 240 mg by mouth daily.  11/05/19  Adrian Prows, MD  hydrALAZINE  (APRESOLINE) 100 MG tablet Take 100 mg by mouth 2 (two) times daily.    [provider]  hydrOXYzine (ATARAX/VISTARIL) 25 MG tablet Take 25 mg by mouth 2 (two) times daily as needed for itching.  08/21/19   [provider]  lactulose (CHRONULAC) 10 GM/15ML solution Take 45 mLs (30 g total) by mouth daily as needed for mild constipation. 10/01/19   [name], MD  lidocaine (LIDODERM) 5 % Place 1 patch onto the skin daily. Remove & Discard patch within 12 hours or as directed by MD 10/28/19   McDonald, Mia A, PA-C  metoprolol tartrate (LOPRESSOR) 100 MG tablet Take 1 tablet (100 mg total) by mouth 2 (two) times daily. 06/04/19   Al Decant, MD  montelukast (SINGULAIR) 10 MG tablet Take 10 mg by mouth every evening.     [provider]  ondansetron (ZOFRAN) 4 MG tablet Take 4 mg by mouth every 4 (four) hours as needed for nausea or vomiting. 09/18/19   [provider]  Oxycodone HCl 10 MG TABS Take 10 mg by mouth 3 (three) times daily as needed (pain).  08/25/19   [provider]  pantoprazole (PROTONIX) 40 MG tablet Take 40 mg by mouth daily.    [provider]  sevelamer carbonate (RENVELA) 800 MG tablet Take 1,600-3,200 mg by mouth See admin instructions. Take 4 tablets (3200 mg) by mouth 3 times daily with meals and 2 tablets (1600 mg) with snacks    [provider]  zolpidem (AMBIEN) 10 MG tablet Take 10 mg by mouth at bedtime as needed for sleep.    [provider]    Allergies    Tramadol, Morphine and related, Pollen extract, Acetaminophen, Aspirin, and Clonidine derivatives  Review of Systems   Review of Systems  Constitutional: Positive for fatigue. Negative for chills and fever.  HENT: Negative for congestion and sore throat.   Respiratory: Positive for shortness of breath. Negative for cough.   Cardiovascular: Negative for chest pain and leg swelling.  Gastrointestinal: Positive for abdominal pain, nausea and  vomiting. Negative for diarrhea.  All other systems reviewed and are negative.   Physical Exam Updated Vital Signs BP (!) 148/83   Pulse 90   Temp 97.9 F (36.6 C) (Oral)   Resp 19   Ht 5\' 9"  (1.753 m)   Wt 64.4 kg   SpO2 96%   BMI 20.97 kg/m   Physical Exam Vitals and nursing note reviewed.  Constitutional:      General: He is not in acute distress.    Appearance: He is not toxic-appearing.  HENT:     Head: Normocephalic.  Eyes:     Pupils: Pupils are equal, round, and reactive to light.  Cardiovascular:     Rate and Rhythm: Regular rhythm. Bradycardia present.     Pulses: Normal pulses.     Heart sounds: Normal heart sounds. No murmur. No friction rub. No gallop.   Pulmonary:     Effort: Pulmonary effort is normal.     Breath sounds: Wheezing present.     Comments: On 2L [place]. Patient speaking in full sentences. No accessory muscle usage.  Abdominal:     General: Abdomen is flat. There is distension.     Palpations: Abdomen is soft.     Tenderness: There is abdominal tenderness.     Comments: Diffuse abdominal tenderness with distention. Abdominal ascites.  Musculoskeletal:     Cervical back: Neck supple.  Comments: No lower extremity edema. Negative homan sign bilaterally.   Skin:    General: Skin is warm and dry.  Neurological:     General: No focal deficit present.     Mental Status: He is alert.  Psychiatric:        Mood and Affect: Mood normal.        Behavior: Behavior normal.     ED Results / Procedures / Treatments   Labs (all labs ordered are listed, but only abnormal results are displayed) Labs Reviewed  CBC - Abnormal; Notable for the following components:      Result Value   RBC 3.23 (*)    Hemoglobin 9.0 (*)    HCT 26.9 (*)    RDW 16.3 (*)    All other components within normal limits  COMPREHENSIVE METABOLIC PANEL - Abnormal; Notable for the following components:   Potassium 5.6 (*)    Chloride 87 (*)    BUN 45 (*)    Creatinine, Ser  5.97 (*)    Albumin 2.6 (*)    AST 63 (*)    Alkaline Phosphatase 317 (*)    GFR calc non Af Amer 10 (*)    GFR calc Af Amer 11 (*)    Anion gap 20 (*)    All other components within normal limits  TROPONIN I (HIGH SENSITIVITY) - Abnormal; Notable for the following components:   Troponin I (High Sensitivity) 34 (*)    All other components within normal limits  AMMONIA  LIPASE, BLOOD  MAGNESIUM    EKG EKG Interpretation  Date/Time:  Tuesday December 18 2019 20:17:53 EDT Ventricular Rate:  56 PR Interval:    QRS Duration: 186 QT Interval:  562 QTC Calculation: 542 R Axis:   136 Text Interpretation: Sinus rhythm with 1st degree A-V block Left bundle branch block Artifact Abnormal ECG Confirmed by Carmin Muskrat [phone]) on 12/18/2019 8:34:55 PM   Radiology DG Chest Portable 1 View  Result Date: 12/18/2019 CLINICAL DATA:  Short of breath, abdominal distension EXAM: PORTABLE CHEST 1 VIEW COMPARISON:  12/12/2019 FINDINGS: Single frontal view of the chest demonstrates an enlarged cardiac silhouette. There is central vascular congestion with bilateral ground-glass airspace disease, left greater than right, favor edema. No effusion or pneumothorax. Chronic elevation left hemidiaphragm. IMPRESSION: 1. Interstitial and alveolar opacities compatible with pulmonary edema. Electronically Signed   By: Randa Ngo M.D.   On: 12/18/2019 20:34   DG Abd Portable 1 View  Result Date: 12/18/2019 CLINICAL DATA:  Abdominal distension, recent paracentesis EXAM: PORTABLE ABDOMEN - 1 VIEW COMPARISON:  09/30/2019 FINDINGS: Supine frontal view of the abdomen and pelvis demonstrate centralization of the bowel gas loops consistent with ascites. No obstruction or ileus. No masses or abnormal calcifications. Extensive atherosclerosis. IMPRESSION: 1. Findings compatible with ascites. 2. No evidence of obstruction or ileus. Electronically Signed   By: Randa Ngo M.D.   On: 12/18/2019 20:36     Procedures Procedures (including critical care time)  Medications Ordered in ED Medications  albuterol (VENTOLIN HFA) 108 (90 Base) MCG/ACT inhaler 2 puff (has no administration in time range)    ED Course  I have reviewed the triage vital signs and the nursing notes.  Pertinent labs & imaging results that were available during my care of the patient were reviewed by me and considered in my medical decision making (see chart for details).  Clinical Course as of Dec 18 2107  Tue Dec 18, 2019  2017 Hemoglobin(!): 9.0 [CA]  Clinical Course User Index [CA] Karie Kirks   MDM Rules/Calculators/A&P                     59 year old male with an extensive past medical history most significant for ESRD on dialysis M/W/F, COPD, chronic hepatitis who presents to the ED due to shortness of breath and abdominal pain that has progressively gotten worse over the past few weeks. Patient seen for the same on 6/2.  Upon arrival, patient afebrile, bradycardic at 50, tachypneic at 22 on 2L [place]. On physical exam, abdomen distended with diffuse abdominal tenderness. No lower extremity edema. Expiratory wheeze heard throughout. No respiratory distress. Will obtain routine labs to rule out electrolyte abnormalities and infectious etiology. CXR and KUB ordered at triage to rule out infectious etiology and ascites. Troponin ordered at triage. Suspect shortness of breath related to ascites given patient's distended abdomen. Low suspicion for SBP given no infectious symptoms. Discussed case with Dr. Vanita Panda who evaluated patient at bedside and agrees with assessment and plan.   CBC reassuring with no leukocytosis.  Hemoglobin at 9 which appears better than baseline.  EKG personally reviewed which demonstrates normal sinus rhythm with a left bundle branch block. EKG appears similar to previous EKG. abdominal x-ray personally reviewed which demonstrates: IMPRESSION:  1. Findings compatible with  ascites.  2. No evidence of obstruction or ileus.   CXR personally reviewed which demonstrates; IMPRESSION:  1. Interstitial and alveolar opacities compatible with pulmonary  edema.   Troponin elevated at 34. Troponin appears to be chronically elevated. CMP significant for hyperkalemia at 5.6, elevated creatinine at 5.97 and BUN at 45.  Creatinine and BUN improved from 5 days prior.  Alkaline phosphatase chronically elevated.  Patient handed off to Dr. Vanita Panda at shift change pending labs.  Final Clinical Impression(s) / ED Diagnoses Final diagnoses:  Ascites of liver  Shortness of breath  Generalized abdominal pain    Rx / DC Orders ED Discharge Orders    None       Karie Kirks 12/18/19 2114    Carmin Muskrat, MD 12/20/19 2028

## 2019-12-20 ENCOUNTER — Encounter (HOSPITAL_COMMUNITY): Payer: Self-pay | Admitting: Emergency Medicine

## 2019-12-20 ENCOUNTER — Emergency Department (HOSPITAL_COMMUNITY)
Admission: EM | Admit: 2019-12-20 | Discharge: 2019-12-21 | Discharge status: Against Medical Advice | Payer: Medicare Other | Source: Home / Self Care

## 2019-12-20 LAB — COMPREHENSIVE METABOLIC PANEL
ALT: 33 U/L (ref 0–44)
AST: 39 U/L (ref 15–41)
Albumin: 2.8 g/dL — ABNORMAL LOW (ref 3.5–5.0)
Alkaline Phosphatase: 287 U/L — ABNORMAL HIGH (ref 38–126)
Anion gap: 16 — ABNORMAL HIGH (ref 5–15)
BUN: 36 mg/dL — ABNORMAL HIGH (ref 6–20)
CO2: 26 mmol/L (ref 22–32)
Calcium: 9.9 mg/dL (ref 8.9–10.3)
Chloride: 90 mmol/L — ABNORMAL LOW (ref 98–111)
Creatinine, Ser: 5.31 mg/dL — ABNORMAL HIGH (ref 0.61–1.24)
GFR calc Af Amer: 13 mL/min — ABNORMAL LOW (ref >60)
GFR calc non Af Amer: 11 mL/min — ABNORMAL LOW (ref >60)
Glucose, Bld: 86 mg/dL (ref 70–99)
Potassium: 4.7 mmol/L (ref 3.5–5.1)
Sodium: 132 mmol/L — ABNORMAL LOW (ref 135–145)
Total Bilirubin: 0.7 mg/dL (ref 0.3–1.2)
Total Protein: 7.4 g/dL (ref 6.5–8.1)

## 2019-12-20 LAB — CBC
HCT: 28.3 % — ABNORMAL LOW (ref 39.0–52.0)
Hemoglobin: 9.5 g/dL — ABNORMAL LOW (ref 13.0–17.0)
MCH: 28.1 pg (ref 26.0–34.0)
MCHC: 33.6 g/dL (ref 30.0–36.0)
MCV: 83.7 fL (ref 80.0–100.0)
Platelets: 235 10*3/uL (ref 150–400)
RBC: 3.38 MIL/uL — ABNORMAL LOW (ref 4.22–5.81)
RDW: 16.9 % — ABNORMAL HIGH (ref 11.5–15.5)
WBC: 9.7 10*3/uL (ref 4.0–10.5)
nRBC: 0 % (ref 0.0–0.2)

## 2019-12-20 LAB — LIPASE, BLOOD: Lipase: 33 U/L (ref 11–51)

## 2019-12-20 MED ORDER — SODIUM CHLORIDE 0.9% FLUSH: 3.0000 mL | Freq: Once | INTRAVENOUS | Status: DC

## 2019-12-20 NOTE — ED Triage Notes (Signed)
Pt arrived POV for abd pain, emesis x 1. Denies diarrhea.

## 2019-12-21 ENCOUNTER — Emergency Department (HOSPITAL_COMMUNITY)
Admission: EM | Admit: 2019-12-21 | Discharge: 2019-12-21 | Disposition: A | Discharge status: Home / Self Care | Payer: Medicare Other | Source: Home / Self Care | Attending: Emergency Medicine | Admitting: Emergency Medicine

## 2019-12-21 ENCOUNTER — Emergency Department (HOSPITAL_COMMUNITY): Payer: Medicare Other

## 2019-12-21 ENCOUNTER — Inpatient Hospital Stay (HOSPITAL_COMMUNITY)
Admission: EM | Admit: 2019-12-21 | Discharge: 2019-12-24 | DRG: 871 | Disposition: A | Discharge status: Home / Self Care | Payer: Medicare Other | Attending: Internal Medicine | Admitting: Internal Medicine

## 2019-12-21 ENCOUNTER — Other Ambulatory Visit: Payer: Self-pay

## 2019-12-21 DIAGNOSIS — I2729 Other secondary pulmonary hypertension: Secondary | ICD-10-CM | POA: Diagnosis present

## 2019-12-21 DIAGNOSIS — A419 Sepsis, unspecified organism: Secondary | ICD-10-CM | POA: Diagnosis not present

## 2019-12-21 DIAGNOSIS — R110 Nausea: Secondary | ICD-10-CM

## 2019-12-21 DIAGNOSIS — R0989 Other specified symptoms and signs involving the circulatory and respiratory systems: Secondary | ICD-10-CM

## 2019-12-21 DIAGNOSIS — I2510 Atherosclerotic heart disease of native coronary artery without angina pectoris: Secondary | ICD-10-CM | POA: Diagnosis present

## 2019-12-21 DIAGNOSIS — K652 Spontaneous bacterial peritonitis: Secondary | ICD-10-CM | POA: Diagnosis present

## 2019-12-21 DIAGNOSIS — K7460 Unspecified cirrhosis of liver: Secondary | ICD-10-CM | POA: Diagnosis present

## 2019-12-21 DIAGNOSIS — J449 Chronic obstructive pulmonary disease, unspecified: Secondary | ICD-10-CM | POA: Diagnosis present

## 2019-12-21 DIAGNOSIS — R188 Other ascites: Secondary | ICD-10-CM | POA: Diagnosis present

## 2019-12-21 DIAGNOSIS — D631 Anemia in chronic kidney disease: Secondary | ICD-10-CM | POA: Diagnosis present

## 2019-12-21 DIAGNOSIS — Z6820 Body mass index (BMI) 20.0-20.9, adult: Secondary | ICD-10-CM

## 2019-12-21 DIAGNOSIS — Z79899 Other long term (current) drug therapy: Secondary | ICD-10-CM

## 2019-12-21 DIAGNOSIS — N2581 Secondary hyperparathyroidism of renal origin: Secondary | ICD-10-CM | POA: Diagnosis present

## 2019-12-21 DIAGNOSIS — J810 Acute pulmonary edema: Secondary | ICD-10-CM | POA: Diagnosis present

## 2019-12-21 DIAGNOSIS — I4891 Unspecified atrial fibrillation: Secondary | ICD-10-CM | POA: Diagnosis present

## 2019-12-21 DIAGNOSIS — I252 Old myocardial infarction: Secondary | ICD-10-CM

## 2019-12-21 DIAGNOSIS — I132 Hypertensive heart and chronic kidney disease with heart failure and with stage 5 chronic kidney disease, or end stage renal disease: Secondary | ICD-10-CM | POA: Diagnosis present

## 2019-12-21 DIAGNOSIS — R1012 Left upper quadrant pain: Secondary | ICD-10-CM | POA: Diagnosis not present

## 2019-12-21 DIAGNOSIS — I482 Chronic atrial fibrillation, unspecified: Secondary | ICD-10-CM | POA: Diagnosis present

## 2019-12-21 DIAGNOSIS — K219 Gastro-esophageal reflux disease without esophagitis: Secondary | ICD-10-CM | POA: Diagnosis present

## 2019-12-21 DIAGNOSIS — Z9115 Patient's noncompliance with renal dialysis: Secondary | ICD-10-CM

## 2019-12-21 DIAGNOSIS — N186 End stage renal disease: Secondary | ICD-10-CM

## 2019-12-21 DIAGNOSIS — I480 Paroxysmal atrial fibrillation: Secondary | ICD-10-CM | POA: Diagnosis present

## 2019-12-21 DIAGNOSIS — F17210 Nicotine dependence, cigarettes, uncomplicated: Secondary | ICD-10-CM | POA: Diagnosis present

## 2019-12-21 DIAGNOSIS — Z66 Do not resuscitate: Secondary | ICD-10-CM | POA: Diagnosis present

## 2019-12-21 DIAGNOSIS — R64 Cachexia: Secondary | ICD-10-CM | POA: Diagnosis present

## 2019-12-21 DIAGNOSIS — B181 Chronic viral hepatitis B without delta-agent: Secondary | ICD-10-CM | POA: Diagnosis present

## 2019-12-21 DIAGNOSIS — J9601 Acute respiratory failure with hypoxia: Secondary | ICD-10-CM | POA: Diagnosis present

## 2019-12-21 DIAGNOSIS — Z992 Dependence on renal dialysis: Secondary | ICD-10-CM | POA: Diagnosis not present

## 2019-12-21 DIAGNOSIS — Z79891 Long term (current) use of opiate analgesic: Secondary | ICD-10-CM

## 2019-12-21 DIAGNOSIS — I10 Essential (primary) hypertension: Secondary | ICD-10-CM | POA: Diagnosis present

## 2019-12-21 DIAGNOSIS — I50810 Right heart failure, unspecified: Secondary | ICD-10-CM | POA: Diagnosis present

## 2019-12-21 DIAGNOSIS — R109 Unspecified abdominal pain: Secondary | ICD-10-CM | POA: Diagnosis present

## 2019-12-21 DIAGNOSIS — E875 Hyperkalemia: Secondary | ICD-10-CM | POA: Diagnosis present

## 2019-12-21 DIAGNOSIS — K7469 Other cirrhosis of liver: Secondary | ICD-10-CM | POA: Diagnosis present

## 2019-12-21 DIAGNOSIS — Z20822 Contact with and (suspected) exposure to covid-19: Secondary | ICD-10-CM | POA: Diagnosis present

## 2019-12-21 LAB — COMPREHENSIVE METABOLIC PANEL
ALT: 28 U/L (ref 0–44)
AST: 39 U/L (ref 15–41)
Albumin: 2.8 g/dL — ABNORMAL LOW (ref 3.5–5.0)
Alkaline Phosphatase: 271 U/L — ABNORMAL HIGH (ref 38–126)
Anion gap: 16 — ABNORMAL HIGH (ref 5–15)
BUN: 50 mg/dL — ABNORMAL HIGH (ref 6–20)
CO2: 24 mmol/L (ref 22–32)
Calcium: 9.6 mg/dL (ref 8.9–10.3)
Chloride: 92 mmol/L — ABNORMAL LOW (ref 98–111)
Creatinine, Ser: 6.38 mg/dL — ABNORMAL HIGH (ref 0.61–1.24)
GFR calc Af Amer: 10 mL/min — ABNORMAL LOW (ref >60)
GFR calc non Af Amer: 9 mL/min — ABNORMAL LOW (ref >60)
Glucose, Bld: 77 mg/dL (ref 70–99)
Potassium: 5.8 mmol/L — ABNORMAL HIGH (ref 3.5–5.1)
Sodium: 132 mmol/L — ABNORMAL LOW (ref 135–145)
Total Bilirubin: 1 mg/dL (ref 0.3–1.2)
Total Protein: 7.1 g/dL (ref 6.5–8.1)

## 2019-12-21 LAB — CBC WITH DIFFERENTIAL/PLATELET
Abs Immature Granulocytes: 0.04 10*3/uL (ref 0.00–0.07)
Basophils Absolute: 0.1 10*3/uL (ref 0.0–0.1)
Basophils Relative: 1 %
Eosinophils Absolute: 0.1 10*3/uL (ref 0.0–0.5)
Eosinophils Relative: 1 %
HCT: 30.4 % — ABNORMAL LOW (ref 39.0–52.0)
Hemoglobin: 10.2 g/dL — ABNORMAL LOW (ref 13.0–17.0)
Immature Granulocytes: 0 %
Lymphocytes Relative: 14 %
Lymphs Abs: 1.4 10*3/uL (ref 0.7–4.0)
MCH: 28.2 pg (ref 26.0–34.0)
MCHC: 33.6 g/dL (ref 30.0–36.0)
MCV: 84 fL (ref 80.0–100.0)
Monocytes Absolute: 0.6 10*3/uL (ref 0.1–1.0)
Monocytes Relative: 6 %
Neutro Abs: 7.9 10*3/uL — ABNORMAL HIGH (ref 1.7–7.7)
Neutrophils Relative %: 78 %
Platelets: 278 10*3/uL (ref 150–400)
RBC: 3.62 MIL/uL — ABNORMAL LOW (ref 4.22–5.81)
RDW: 17.4 % — ABNORMAL HIGH (ref 11.5–15.5)
WBC: 10.2 10*3/uL (ref 4.0–10.5)
nRBC: 0.3 % — ABNORMAL HIGH (ref 0.0–0.2)

## 2019-12-21 LAB — SARS CORONAVIRUS 2 BY RT PCR (HOSPITAL ORDER, PERFORMED IN ~~LOC~~ HOSPITAL LAB): SARS Coronavirus 2: NEGATIVE

## 2019-12-21 MED ORDER — METOPROLOL TARTRATE 100 MG PO TABS
100.0000 mg | ORAL_TABLET | Freq: Two times a day (BID) | ORAL | Status: DC
  Administered 2019-12-22 – 2019-12-24 (×4): 100 mg via ORAL
  Filled 2019-12-21 (×4): qty 1

## 2019-12-21 MED ORDER — CHLORHEXIDINE GLUCONATE CLOTH 2 % EX PADS
6.0000 | MEDICATED_PAD | Freq: Every day | CUTANEOUS | Status: DC
  Administered 2019-12-22 – 2019-12-24 (×3): 6 via TOPICAL

## 2019-12-21 MED ORDER — SEVELAMER CARBONATE 800 MG PO TABS
3200.0000 mg | ORAL_TABLET | Freq: Three times a day (TID) | ORAL | Status: DC
  Administered 2019-12-22 – 2019-12-23 (×4): 3200 mg via ORAL
  Filled 2019-12-21 (×6): qty 4

## 2019-12-21 MED ORDER — SEVELAMER CARBONATE 800 MG PO TABS
1600.0000 mg | ORAL_TABLET | ORAL | Status: DC | PRN
  Filled 2019-12-21: qty 2

## 2019-12-21 MED ORDER — SODIUM ZIRCONIUM CYCLOSILICATE 10 G PO PACK
10.0000 g | PACK | Freq: Once | ORAL | Status: AC
  Administered 2019-12-21: 10 g via ORAL
  Filled 2019-12-21: qty 1

## 2019-12-21 MED ORDER — HYDROMORPHONE HCL 1 MG/ML IJ SOLN
1.0000 mg | Freq: Once | INTRAMUSCULAR | Status: AC
  Administered 2019-12-21: 1 mg via INTRAMUSCULAR
  Filled 2019-12-21: qty 1

## 2019-12-21 MED ORDER — HYDRALAZINE HCL 50 MG PO TABS
100.0000 mg | ORAL_TABLET | Freq: Two times a day (BID) | ORAL | Status: DC
  Filled 2019-12-21: qty 2

## 2019-12-21 MED ORDER — PANTOPRAZOLE SODIUM 40 MG PO TBEC
40.0000 mg | DELAYED_RELEASE_TABLET | Freq: Every day | ORAL | Status: DC
  Administered 2019-12-22 – 2019-12-24 (×3): 40 mg via ORAL
  Filled 2019-12-21 (×3): qty 1

## 2019-12-21 MED ORDER — LIDOCAINE 5 % EX PTCH
1.0000 | MEDICATED_PATCH | CUTANEOUS | Status: DC
  Filled 2019-12-21 (×3): qty 1

## 2019-12-21 MED ORDER — LIDOCAINE 5 % EX PTCH: 1.0000 | MEDICATED_PATCH | CUTANEOUS | 0 refills | Status: DC

## 2019-12-21 MED ORDER — GABAPENTIN 100 MG PO CAPS
100.0000 mg | ORAL_CAPSULE | Freq: Three times a day (TID) | ORAL | Status: DC
  Administered 2019-12-21 – 2019-12-24 (×7): 100 mg via ORAL
  Filled 2019-12-21 (×7): qty 1

## 2019-12-21 MED ORDER — HEPARIN SODIUM (PORCINE) 5000 UNIT/ML IJ SOLN
5000.0000 [IU] | Freq: Three times a day (TID) | INTRAMUSCULAR | Status: DC
  Filled 2019-12-21: qty 1

## 2019-12-21 MED ORDER — DILTIAZEM HCL ER COATED BEADS 240 MG PO CP24
240.0000 mg | ORAL_CAPSULE | Freq: Every day | ORAL | Status: DC
  Administered 2019-12-22 – 2019-12-24 (×3): 240 mg via ORAL
  Filled 2019-12-21 (×3): qty 1

## 2019-12-21 MED ORDER — HYDROMORPHONE HCL 1 MG/ML IJ SOLN
1.0000 mg | Freq: Once | INTRAMUSCULAR | Status: AC
  Administered 2019-12-21: 1 mg via INTRAVENOUS
  Filled 2019-12-21: qty 1

## 2019-12-21 MED ORDER — GABAPENTIN 100 MG PO CAPS
100.0000 mg | ORAL_CAPSULE | Freq: Three times a day (TID) | ORAL | 0 refills | Status: DC
Start: 2019-12-21 — End: 2019-12-27

## 2019-12-21 MED ORDER — LACTULOSE 10 GM/15ML PO SOLN
30.0000 g | Freq: Every day | ORAL | Status: DC | PRN
  Filled 2019-12-21: qty 45

## 2019-12-21 MED ORDER — PROCHLORPERAZINE EDISYLATE 10 MG/2ML IJ SOLN
5.0000 mg | Freq: Once | INTRAMUSCULAR | Status: AC
  Administered 2019-12-21: 5 mg via INTRAVENOUS
  Filled 2019-12-21: qty 2

## 2019-12-21 MED ORDER — AMIODARONE HCL 200 MG PO TABS
200.0000 mg | ORAL_TABLET | Freq: Two times a day (BID) | ORAL | Status: DC
  Administered 2019-12-22 – 2019-12-24 (×5): 200 mg via ORAL
  Filled 2019-12-21 (×5): qty 1

## 2019-12-21 MED ORDER — OXYCODONE HCL 5 MG PO TABS: 10.0000 mg | ORAL_TABLET | Freq: Once | ORAL | Status: DC

## 2019-12-21 NOTE — Discharge Instructions (Addendum)
Follow up with chronic pain doctor.

## 2019-12-21 NOTE — H&P (Signed)
History and Physical    Fed Ceci HWT:[id number] DOB: 01-02-1961 DOA: 12/21/2019  PCP: [name], FNP  Patient coming from: Home.  Chief Complaint: Abdominal pain shortness of breath.  HPI: [name] is a 59 y.o. male with history of ESRD on hemodialysis on Monday Wednesday Friday, paroxysmal atrial fibrillation, CAD, hypertension, cirrhosis of the liver with previous history of SBP presents to the ER because of increasing abdominal pain which has been ongoing for many weeks but has recently worsened.  Had some nausea vomiting denies any blood in the vomitus denies any diarrhea.  Denies fever chills but had come to the ER 24th ago was treated symptomatically and discharged home.  During which patient missed his dialysis.  Presently has some shortness of breath since missing dialysis.  Denies chest pain.  ED Course: In the ER chest x-ray shows congestion abdomen appears mildly distended.  EKG shows sinus rhythm with LBBB.  Labs show potassium 5.8 albumin 2.8 LFTs show elevated alk phos but normal AST ALT.  Hemoglobin at baseline.  Covid test test negative.  ER physician discussed with on-call nephrologist will be arranging for dialysis in the morning.  Review of Systems: As per HPI, rest all negative.   Past Medical History:  Diagnosis Date  . Anemia   . Anxiety   . Arthritis    left shoulder  . Atherosclerosis of aorta ([place])   . Cardiomegaly   . Chest pain    DATE UNKNOWN, C/O PERIODICALLY  . Cocaine abuse ([place])   . COPD exacerbation ([place]) 08/17/2016  . Coronary artery disease    stent 02/22/17  . ESRD (end stage renal disease) on dialysis ([place])    "[name]; MWF" (07/04/2017)  . GERD (gastroesophageal reflux disease)    DATE UNKNOWN  . Hemorrhoids   . Hepatitis B, chronic ([place])   . Hepatitis C   . History of kidney stones   . Hyperkalemia   . Hypertension   . Kidney failure   . Metabolic bone disease    Patient denies  . Mitral stenosis     . Myocardial infarction ([place])   . Pneumonia   . Pulmonary edema   . Solitary rectal ulcer syndrome 07/2017   at flex sig for rectal bleeding  . Tubular adenoma of colon     Past Surgical History:  Procedure Laterality Date  . A/V FISTULAGRAM Left 05/26/2017   Procedure: A/V FISTULAGRAM;  Surgeon: Conrad Bear, MD;  Location: [place] [facility];  Service: Cardiovascular;  Laterality: Left;  . A/V FISTULAGRAM Right 11/18/2017   Procedure: A/V FISTULAGRAM - Right Arm;  Surgeon: Elam Dutch, MD;  Location: [place] [facility];  Service: Cardiovascular;  Laterality: Right;  . APPLICATION OF WOUND VAC Left 06/14/2017   Procedure: APPLICATION OF WOUND VAC;  Surgeon: Katha Cabal, MD;  Location: [facility];  Service: Vascular;  Laterality: Left;  . AV FISTULA PLACEMENT  2012   BELIEVED WAS PLACED IN JUNE  . AV FISTULA PLACEMENT Right 08/09/2017   Procedure: Creation Right arm ARTERIOVENOUS BRACHIOCEPOHALIC FISTULA;  Surgeon: Elam Dutch, MD;  Location: [facility];  Service: Vascular;  Laterality: Right;  . AV FISTULA PLACEMENT Right 11/22/2017   Procedure: INSERTION OF ARTERIOVENOUS (AV) GORE-TEX GRAFT RIGHT UPPER ARM;  Surgeon: Elam Dutch, MD;  Location: [place];  Service: Vascular;  Laterality: Right;  . BIOPSY  01/25/2018   Procedure: BIOPSY;  Surgeon: Jerene Bears, MD;  Location: [place] [facility];  Service: Gastroenterology;;  . BIOPSY  04/10/2019   Procedure: BIOPSY;  Surgeon: Jerene Bears, MD;  Location: [facility];  Service: Gastroenterology;;  . COLONOSCOPY    . COLONOSCOPY WITH PROPOFOL N/A 01/25/2018   Procedure: COLONOSCOPY WITH PROPOFOL;  Surgeon: Jerene Bears, MD;  Location: [place];  Service: Gastroenterology;  Laterality: N/A;  . CORONARY STENT INTERVENTION N/A 02/22/2017   Procedure: CORONARY STENT INTERVENTION;  Surgeon: Nigel Mormon, MD;  Location: [place] [facility];  Service: Cardiovascular;  Laterality: N/A;  . ESOPHAGOGASTRODUODENOSCOPY (EGD) WITH  PROPOFOL N/A 01/25/2018   Procedure: ESOPHAGOGASTRODUODENOSCOPY (EGD) WITH PROPOFOL;  Surgeon: Jerene Bears, MD;  Location: [place];  Service: Gastroenterology;  Laterality: N/A;  . ESOPHAGOGASTRODUODENOSCOPY (EGD) WITH PROPOFOL N/A 04/10/2019   Procedure: ESOPHAGOGASTRODUODENOSCOPY (EGD) WITH PROPOFOL;  Surgeon: Jerene Bears, MD;  Location: [facility];  Service: Gastroenterology;  Laterality: N/A;  . FLEXIBLE SIGMOIDOSCOPY N/A 07/15/2017   Procedure: FLEXIBLE SIGMOIDOSCOPY;  Surgeon: Carol Ada, MD;  Location: [place];  Service: Endoscopy;  Laterality: N/A;  . HEMORRHOID BANDING    . I & D EXTREMITY Left 06/01/2017   Procedure: IRRIGATION AND DEBRIDEMENT LEFT ARM HEMATOMA WITH LIGATION OF LEFT ARM AV FISTULA;  Surgeon: Elam Dutch, MD;  Location: [place];  Service: Vascular;  Laterality: Left;  . I & D EXTREMITY Left 06/14/2017   Procedure: IRRIGATION AND DEBRIDEMENT EXTREMITY;  Surgeon: Katha Cabal, MD;  Location: [facility];  Service: Vascular;  Laterality: Left;  . INSERTION OF DIALYSIS CATHETER  05/30/2017  . INSERTION OF DIALYSIS CATHETER N/A 05/30/2017   Procedure: INSERTION OF DIALYSIS CATHETER;  Surgeon: Elam Dutch, MD;  Location: [place];  Service: Vascular;  Laterality: N/A;  . IR PARACENTESIS  08/30/2017  . IR PARACENTESIS  09/29/2017  . IR PARACENTESIS  10/28/2017  . IR PARACENTESIS  11/09/2017  . IR PARACENTESIS  11/16/2017  . IR PARACENTESIS  11/28/2017  . IR PARACENTESIS  12/01/2017  . IR PARACENTESIS  12/06/2017  . IR PARACENTESIS  01/03/2018  . IR PARACENTESIS  01/23/2018  . IR PARACENTESIS  02/07/2018  . IR PARACENTESIS  02/21/2018  . IR PARACENTESIS  03/06/2018  . IR PARACENTESIS  03/17/2018  . IR PARACENTESIS  04/04/2018  . IR PARACENTESIS  12/28/2018  . IR PARACENTESIS  01/08/2019  . IR PARACENTESIS  01/23/2019  . IR PARACENTESIS  02/01/2019  . IR PARACENTESIS  02/19/2019  . IR PARACENTESIS  03/01/2019  . IR PARACENTESIS  03/15/2019  . IR PARACENTESIS   04/03/2019  . IR PARACENTESIS  04/12/2019  . IR PARACENTESIS  05/01/2019  . IR PARACENTESIS  05/08/2019  . IR PARACENTESIS  05/24/2019  . IR PARACENTESIS  06/12/2019  . IR PARACENTESIS  07/09/2019  . IR PARACENTESIS  07/27/2019  . IR PARACENTESIS  08/09/2019  . IR PARACENTESIS  08/21/2019  . IR PARACENTESIS  09/17/2019  . IR PARACENTESIS  10/05/2019  . IR PARACENTESIS  10/29/2019  . IR PARACENTESIS  11/08/2019  . IR PARACENTESIS  12/12/2019  . IR RADIOLOGIST EVAL & MGMT  02/14/2018  . IR RADIOLOGIST EVAL & MGMT  02/22/2019  . LEFT HEART CATH AND CORONARY ANGIOGRAPHY N/A 02/22/2017   Procedure: LEFT HEART CATH AND CORONARY ANGIOGRAPHY;  Surgeon: Nigel Mormon, MD;  Location: [place] [facility];  Service: Cardiovascular;  Laterality: N/A;  . LIGATION OF ARTERIOVENOUS  FISTULA Left 01/12/4192   Procedure: Plication of Left Arm Arteriovenous Fistula;  Surgeon: Elam Dutch, MD;  Location: [place];  Service: Vascular;  Laterality: Left;  . POLYPECTOMY    . POLYPECTOMY  01/25/2018   Procedure: POLYPECTOMY;  Surgeon: Jerene Bears, MD;  Location: [place];  Service: Gastroenterology;;  . REVISON OF ARTERIOVENOUS FISTULA Left 6/70/1410   Procedure: PLICATION OF DISTAL ANEURYSMAL SEGEMENT OF LEFT UPPER ARM ARTERIOVENOUS FISTULA;  Surgeon: Elam Dutch, MD;  Location: [place];  Service: Vascular;  Laterality: Left;  . REVISON OF ARTERIOVENOUS FISTULA Left 09/09/3141   Procedure: Plication of Left Upper Arm Fistula ;  Surgeon: Waynetta Sandy, MD;  Location: [place];  Service: Vascular;  Laterality: Left;  . SKIN GRAFT SPLIT THICKNESS LEG / FOOT Left    SKIN GRAFT SPLIT THICKNESS LEFT ARM DONOR SITE: LEFT ANTERIOR THIGH  . SKIN SPLIT GRAFT Left 07/04/2017   Procedure: SKIN GRAFT SPLIT THICKNESS LEFT ARM DONOR SITE: LEFT ANTERIOR THIGH;  Surgeon: Elam Dutch, MD;  Location: [place];  Service: Vascular;  Laterality: Left;  . THROMBECTOMY W/ EMBOLECTOMY Left 06/05/2017   Procedure: EXPLORATION  OF LEFT ARM FOR BLEEDING; OVERSEWED PROXIMAL FISTULA;  Surgeon: Angelia Mould, MD;  Location: [place];  Service: Vascular;  Laterality: Left;  . WOUND EXPLORATION Left 06/03/2017   Procedure: WOUND EXPLORATION WITH WOUND VAC APPLICATION TO LEFT ARM;  Surgeon: Angelia Mould, MD;  Location: [place];  Service: Vascular;  Laterality: Left;     reports that he has been smoking cigarettes. He started smoking about 46 years ago. He has a 21.50 pack-year smoking history. He has never used smokeless tobacco. He reports previous alcohol use. He reports previous drug use. Drugs: Marijuana and Cocaine.  Allergies  Allergen Reactions  . Tramadol Itching and Other (See Comments)  . Morphine And Related Other (See Comments)    Stomach pain  . Pollen Extract     Other reaction(s): Sneezing (finding)  . Acetaminophen Nausea Only    Stomach ache Other reaction(s): Other (See Comments) Stomach ache  . Aspirin Other (See Comments) and Itching    STOMACH PAIN Other reaction(s): Unknown STOMACH PAIN  . Clonidine Derivatives Itching    Family History  Problem Relation Age of Onset  . Heart disease Mother   . Lung cancer Mother   . Heart disease Father   . Malignant hyperthermia Father   . COPD Father   . Throat cancer Sister   . Esophageal cancer Sister   . Hypertension Other   . COPD Other   . Colon cancer Neg Hx   . Colon polyps Neg Hx   . Rectal cancer Neg Hx   . Stomach cancer Neg Hx     Prior to Admission medications   Medication Sig Start Date End Date Taking? Authorizing Provider  amiodarone (PACERONE) 200 MG tablet Take 1 tablet (200 mg total) by mouth 2 (two) times daily. 11/26/19 12/26/19 Yes McDonald, Mia A, PA-C  diltiazem (CARDIZEM CD) 240 MG 24 hr capsule TAKE 1 CAPSULE BY MOUTH EVERY DAY Patient taking differently: Take 240 mg by mouth daily.  11/05/19  Yes Adrian Prows, MD  gabapentin (NEURONTIN) 100 MG capsule Take 1 capsule (100 mg total) by mouth 3 (three) times  daily for 20 doses. 12/21/19 12/28/19 Yes Curatolo, Adam, DO  hydrALAZINE (APRESOLINE) 100 MG tablet Take 100 mg by mouth 2 (two) times daily.   Yes [provider]  hydrOXYzine (ATARAX/VISTARIL) 25 MG tablet Take 25 mg by mouth 2 (two) times daily as needed for itching.  08/21/19  Yes [provider]  lactulose (  CHRONULAC) 10 GM/15ML solution Take 45 mLs (30 g total) by mouth daily as needed for mild constipation. 10/01/19  Yes [name], MD  lidocaine (LIDODERM) 5 % Place 1 patch onto the skin daily. Remove & Discard patch within 12 hours or as directed by MD 10/28/19  Yes McDonald, Mia A, PA-C  lidocaine (LIDODERM) 5 % Place 1 patch onto the skin daily for 10 doses. Remove & Discard patch within 12 hours or as directed by MD 12/21/19 12/31/19 Yes Curatolo, Adam, DO  sevelamer carbonate (RENVELA) 800 MG tablet Take 1,600-3,200 mg by mouth See admin instructions. Take 4 tablets (3200 mg) by mouth 3 times daily with meals and 2 tablets (1600 mg) with snacks   Yes [provider]  metoprolol tartrate (LOPRESSOR) 100 MG tablet Take 1 tablet (100 mg total) by mouth 2 (two) times daily. Patient not taking: Reported on 12/21/2019 06/04/19   Al Decant, MD  ondansetron (ZOFRAN) 4 MG tablet Take 4 mg by mouth every 4 (four) hours as needed for nausea or vomiting. Patient not taking: Reported on 12/21/2019 09/18/19   [provider]  pantoprazole (PROTONIX) 40 MG tablet Take 40 mg by mouth daily. Patient not taking: Reported on 12/21/2019    [provider]    Physical Exam: Constitutional: Moderately built and nourished. Vitals:   12/21/19 1758 12/21/19 1800 12/21/19 1838  BP: 107/68 105/86 120/78  Pulse: (!) 139  (!) 142  Resp: (!) _0 Temp:  97.7 F (36.5 C)   TempSrc:  Oral   SpO2: 98%  100%   Eyes: Anicteric no pallor. ENMT: No discharge from the ears eyes nose or mouth. Neck: No mass felt.  No neck rigidity. Respiratory: No rhonchi or  crepitations. Cardiovascular: S1-S2 heard. Abdomen: Mildly distended nontender bowel sounds present. Musculoskeletal: No edema. Skin: No rash. Neurologic: Alert awake oriented time place and person.  Moves all extremities. Psychiatric: Appears normal.  Normal affect.   Labs on Admission: I have personally reviewed following labs and imaging studies  CBC: Recent Labs  Lab 12/18/19 2000 12/20/19 2032 12/21/19 1837  WBC 9.7 9.7 10.2  NEUTROABS  --   --  7.9*  HGB 9.0* 9.5* 10.2*  HCT 26.9* 28.3* 30.4*  MCV 83.3 83.7 84.0  PLT 235 235 027   Basic Metabolic Panel: Recent Labs  Lab 12/18/19 2000 12/18/19 2101 12/20/19 2032 12/21/19 1927  NA 135  --  132* 132*  K 5.6*  --  4.7 5.8*  CL 87*  --  90* 92*  CO2 28  --  26 24  GLUCOSE 95  --  86 77  BUN 45*  --  36* 50*  CREATININE 5.97*  --  5.31* 6.38*  CALCIUM 10.0  --  9.9 9.6  MG  --  2.2  --   --    GFR: Estimated Creatinine Clearance: 11.6 mL/min (A) (by C-G formula based on SCr of 6.38 mg/dL (H)). Liver Function Tests: Recent Labs  Lab 12/18/19 2000 12/20/19 2032 12/21/19 1927  AST 63* 39 39  ALT 43 33 28  ALKPHOS 317* 287* 271*  BILITOT 0.7 0.7 1.0  PROT 6.9 7.4 7.1  ALBUMIN 2.6* 2.8* 2.8*   Recent Labs  Lab 12/18/19 2101 12/20/19 2032  LIPASE 37 33   Recent Labs  Lab 12/18/19 2101  AMMONIA 64*   Coagulation Profile: No results for input(s): INR, PROTIME in the last 168 hours. Cardiac Enzymes: No results for input(s): CKTOTAL, CKMB, CKMBINDEX,  TROPONINI in the last 168 hours. BNP (last 3 results) No results for input(s): PROBNP in the last 8760 hours. HbA1C: No results for input(s): HGBA1C in the last 72 hours. CBG: No results for input(s): GLUCAP in the last 168 hours. Lipid Profile: No results for input(s): CHOL, HDL, LDLCALC, TRIG, CHOLHDL, LDLDIRECT in the last 72 hours. Thyroid Function Tests: No results for input(s): TSH, T4TOTAL, FREET4, T3FREE, THYROIDAB in the last 72  hours. Anemia Panel: No results for input(s): VITAMINB12, FOLATE, FERRITIN, TIBC, IRON, RETICCTPCT in the last 72 hours. Urine analysis:    Component Value Date/Time   COLORURINE YELLOW 07/16/2011 1619   APPEARANCEUR CLEAR 07/16/2011 1619   LABSPEC 1.018 07/16/2011 1619   PHURINE 7.5 07/16/2011 1619   GLUCOSEU 250 (A) 07/16/2011 1619   HGBUR MODERATE (A) 07/16/2011 1619   BILIRUBINUR SMALL (A) 07/16/2011 1619   KETONESUR NEGATIVE 07/16/2011 1619   PROTEINUR >300 (A) 07/16/2011 1619   UROBILINOGEN 1.0 07/16/2011 1619   NITRITE NEGATIVE 07/16/2011 1619   LEUKOCYTESUR SMALL (A) 07/16/2011 1619   Sepsis Labs: _0 (procalcitonin:4,lacticidven:4) )No results found for this or any previous visit (from the past 240 hour(s)).   Radiological Exams on Admission: DG Chest Port 1 View  Result Date: 12/21/2019 CLINICAL DATA:  Shortness of breath.  End-stage renal disease EXAM: PORTABLE CHEST 1 VIEW COMPARISON:  December 18, 2019 FINDINGS: There is ill-defined opacity in portions of each mid and lower lung region, similar to recent study, which is felt to represent a degree of interstitial edema. No consolidation. There is cardiomegaly with a degree of pulmonary venous hypertension. No adenopathy. There is aortic atherosclerosis. There is calcification in each carotid artery an each subclavian artery. No adenopathy. No bone lesion. IMPRESSION: Apparent interstitial edema in the mid and lower lung regions, similar to recent study. There is cardiomegaly with a degree of pulmonary vascular congestion. Suspect congestive heart failure. No consolidation evident. Extensive atherosclerotic calcification at multiple sites as noted above. Aortic Atherosclerosis (ICD10-I70.0). Electronically Signed   By: Lowella Grip III M.D.   On: 12/21/2019 19:06    EKG: Independently reviewed.  Normal sinus rhythm with RBBB.  Assessment/Plan Principal Problem:   Acute pulmonary edema (HCC) Active Problems:    Chronic hepatitis B (HCC)   COPD GOLD 0 with flattening on inps f/v    Essential hypertension   COPD (chronic obstructive pulmonary disease) (HCC)   ESRD on dialysis ([place])   Other cirrhosis of liver (HCC)   Abdominal pain   A-fib ([place])    1. Pulmonary edema secondary to missing dialysis for which nephrology has been consulted.  I think patient's pulmonary edema will improve with dialysis. 2. Abdominal pain with history of cirrhosis of liver with mildly distended abdomen have ordered CT abdomen.  We will keep patient on empiric coverage for SBP given the previous history of SBP.  If CT abdomen does show significant ascites I will order ultrasound-guided paracentesis to rule out SBP. 3. History of proximal atrial fibrillation on amiodarone metoprolol and Cardizem.  Per cardiology note patient was felt not to be a candidate for anticoagulation. 4. History of CAD denies any chest pain. 5. Anemia likely from ESRD appears to be chronic follow CBC. 6. COPD not actively wheezing.   DVT prophylaxis: We will keep patient SCDs in case patient needs paracentesis. Code Status: Full code. Family Communication: Discussed with patient. Disposition Plan: Home. Consults called: Nephrology. Admission status: Observation.   Rise Patience MD [facility] Pager [phone].  If 7PM-7AM,  please contact night-coverage [email or web address] Password [facility]  12/21/2019, 10:39 PM

## 2019-12-21 NOTE — ED Triage Notes (Signed)
Pt here ems with co SOB and abd pain. Pt just dc from ED this morning with same CO. Pt noted to have a HR with EMS of 150-160 initially. Pt administered a small amount of amio and pt HR 140 VTach.

## 2019-12-21 NOTE — ED Provider Notes (Signed)
[facility] EMERGENCY DEPARTMENT Provider Note   CSN: [id number] Arrival date & time: 12/21/19  1741     History Chief Complaint  Patient presents with  . Abdominal Pain  . Shortness of Breath    [name] is a 59 y.o. male.  HPI    Patient presents for the second time today, third time in the past week, with concern for abdominal pain. Patient has multiple medical issues, notes that after discharge today continue to have abdominal pain, and called EMS due to this. Until EMS providers made him aware of tachycardia, he did not have palpitations, no chest pain.  However, when asked about what brings him here, he notes that he has left lateral abdominal pain as well as ventricular tachycardia. Patient notes a history of end-stage renal disease, last had dialysis 2 days ago.  He was scheduled for dialysis today, but as he was being evaluated in the emergency department he was unable to go. He notes that he takes narcotics daily for pain control, has been unable to get to his pain management clinic, is not due for refill in his medications until next week. This aspect of his history is available through chart review.  Patient notes that he is also struggling to find a new primary care provider. He notes that his abdominal pain is left-sided, sore, severe, not improved with medication provided earlier today.  No new vomiting, fever, diarrhea, no new dyspnea. No relief with anything else today. Past Medical History:  Diagnosis Date  . Anemia   . Anxiety   . Arthritis    left shoulder  . Atherosclerosis of aorta ([place])   . Cardiomegaly   . Chest pain    DATE UNKNOWN, C/O PERIODICALLY  . Cocaine abuse ([place])   . COPD exacerbation ([place]) 08/17/2016  . Coronary artery disease    stent 02/22/17  . ESRD (end stage renal disease) on dialysis ([place])    "[name]; MWF" (07/04/2017)  . GERD (gastroesophageal reflux disease)    DATE UNKNOWN  . Hemorrhoids   .  Hepatitis B, chronic ([place])   . Hepatitis C   . History of kidney stones   . Hyperkalemia   . Hypertension   . Kidney failure   . Metabolic bone disease    Patient denies  . Mitral stenosis   . Myocardial infarction ([place])   . Pneumonia   . Pulmonary edema   . Solitary rectal ulcer syndrome 07/2017   at [facility] for rectal bleeding  . Tubular adenoma of colon     Patient Active Problem List   Diagnosis Date Noted  . Acetaminophen overdose 10/27/2019  . ESRD (end stage renal disease) ([place])   . Goals of care, counseling/discussion   . Hypoxia 10/04/2019  . Advanced care planning/counseling discussion   . Renal failure 09/26/2019  . Dyspnea 06/09/2019  . Acquired thrombophilia ([place]) 06/05/2019  . A-fib ([place]) 05/30/2019  . Atrial fibrillation with RVR ([place]) 05/29/2019  . Melena   . Pressure injury of skin 03/09/2019  . Abdominal distention   . Volume overload 12/28/2018  . Sepsis ([place]) 09/12/2018  . Atherosclerosis of native coronary artery of native heart without angina pectoris 03/11/2018  . Benign neoplasm of cecum   . Benign neoplasm of ascending colon   . Benign neoplasm of descending colon   . Benign neoplasm of rectum   . Paroxysmal atrial fibrillation ([place]) 01/23/2018  . Hx of colonic polyps 01/20/2018  . End-stage renal  disease on hemodialysis ([place]) 11/21/2017  . GERD (gastroesophageal reflux disease) 11/16/2017  . Decompensated hepatic cirrhosis ([place]) 11/15/2017  . DNR (do not resuscitate)   . Palliative care by specialist   . Hyponatremia 11/04/2017  . SBP (spontaneous bacterial peritonitis) ([place]) 10/30/2017  . Liver disease, chronic 10/30/2017  . SOB (shortness of breath)   . Abdominal pain 10/28/2017  . Upper airway cough syndrome with flattening on f/v loop 10/13/17 c/w vcd 10/17/2017  . Elevated diaphragm 10/13/2017  . Ileus ([place]) 09/29/2017  . QT prolongation 09/29/2017  . Malnutrition of moderate degree 09/29/2017  . Sinus congestion 09/03/2017  .  Symptomatic anemia 09/02/2017  . Other cirrhosis of liver ([place]) 09/02/2017  . Left bundle branch block 09/02/2017  . Mitral stenosis 09/02/2017  . Hematochezia 07/15/2017  . Wide-complex tachycardia ([place])   . Endotracheally intubated   . ESRD on dialysis ([place]) 07/04/2017  . Acute respiratory failure with hypoxia ([place]) 06/18/2017  . CKD (chronic kidney disease) stage V requiring chronic dialysis ([place]) 06/18/2017  . History of Cocaine abuse ([place]) 06/18/2017  . Hypertension 06/18/2017  . Infection of AV graft for dialysis ([place]) 06/18/2017  . Anxiety 06/18/2017  . Anemia due to chronic kidney disease 06/18/2017  . Atypical atrial flutter ([place]) 06/18/2017  . Personality disorder ([place]) 06/13/2017  . Cellulitis 06/12/2017  . Adjustment disorder with mixed anxiety and depressed mood 06/10/2017  . Suicidal ideation 06/10/2017  . Arm wound, left, sequela 06/10/2017  . Dyspnea on exertion 05/29/2017  . Tachycardia 05/29/2017  . Hyperkalemia 05/22/2017  . Acute metabolic encephalopathy   . Anemia 04/23/2017  . Ascites 04/23/2017  . COPD (chronic obstructive pulmonary disease) ([place]) 04/23/2017  . Acute on chronic respiratory failure with hypoxia ([place]) 03/25/2017  . Arrhythmia 03/25/2017  . COPD GOLD 0 with flattening on inps f/v  09/27/2016  . Essential hypertension 09/27/2016  . Fluid overload 08/30/2016  . COPD exacerbation ([place]) 08/17/2016  . Hypertensive urgency 08/17/2016  . Respiratory failure ([place]) 08/17/2016  . Problem with dialysis access ([place]) 07/23/2016  . Chronic hepatitis B ([place]) 03/05/2014  . Chronic hepatitis C without hepatic coma ([place]) 03/05/2014  . Internal hemorrhoids with bleeding, swelling and itching 03/05/2014  . Thrombocytopenia ([place]) 03/05/2014  . Chest pain 02/27/2014  . Alcohol abuse 04/14/2009  . Cigarette smoker 04/14/2009  . GANGLION CYST 04/14/2009    Past Surgical History:  Procedure Laterality Date  . A/V FISTULAGRAM Left 05/26/2017   Procedure:  A/V FISTULAGRAM;  Surgeon: Conrad Burney, MD;  Location: [facility];  Service: Cardiovascular;  Laterality: Left;  . A/V FISTULAGRAM Right 11/18/2017   Procedure: A/V FISTULAGRAM - Right Arm;  Surgeon: Elam Dutch, MD;  Location: [facility];  Service: Cardiovascular;  Laterality: Right;  . APPLICATION OF WOUND VAC Left 06/14/2017   Procedure: APPLICATION OF WOUND VAC;  Surgeon: Katha Cabal, MD;  Location: [facility];  Service: Vascular;  Laterality: Left;  . AV FISTULA PLACEMENT  2012   BELIEVED WAS PLACED IN JUNE  . AV FISTULA PLACEMENT Right 08/09/2017   Procedure: Creation Right arm ARTERIOVENOUS BRACHIOCEPOHALIC FISTULA;  Surgeon: Elam Dutch, MD;  Location: [facility] OR;  Service: Vascular;  Laterality: Right;  . AV FISTULA PLACEMENT Right 11/22/2017   Procedure: INSERTION OF ARTERIOVENOUS (AV) GORE-TEX GRAFT RIGHT UPPER ARM;  Surgeon: Elam Dutch, MD;  Location: [facility];  Service: Vascular;  Laterality: Right;  . BIOPSY  01/25/2018   Procedure: BIOPSY;  Surgeon: Jerene Bears, MD;  Location: [facility];  Service: Gastroenterology;;  . BIOPSY  04/10/2019   Procedure: BIOPSY;  Surgeon: Jerene Bears, MD;  Location: [facility];  Service: Gastroenterology;;  . COLONOSCOPY    . COLONOSCOPY WITH PROPOFOL N/A 01/25/2018   Procedure: COLONOSCOPY WITH PROPOFOL;  Surgeon: Jerene Bears, MD;  Location: [facility];  Service: Gastroenterology;  Laterality: N/A;  . CORONARY STENT INTERVENTION N/A 02/22/2017   Procedure: CORONARY STENT INTERVENTION;  Surgeon: Nigel Mormon, MD;  Location: [facility];  Service: Cardiovascular;  Laterality: N/A;  . ESOPHAGOGASTRODUODENOSCOPY (EGD) WITH PROPOFOL N/A 01/25/2018   Procedure: ESOPHAGOGASTRODUODENOSCOPY (EGD) WITH PROPOFOL;  Surgeon: Jerene Bears, MD;  Location: [facility];  Service: Gastroenterology;  Laterality: N/A;  . ESOPHAGOGASTRODUODENOSCOPY (EGD) WITH PROPOFOL N/A 04/10/2019   Procedure:  ESOPHAGOGASTRODUODENOSCOPY (EGD) WITH PROPOFOL;  Surgeon: Jerene Bears, MD;  [facility];  Service: Gastroenterology;  Laterality: N/A;  . FLEXIBLE SIGMOIDOSCOPY N/A 07/15/2017   Procedure: FLEXIBLE SIGMOIDOSCOPY;  Surgeon: Carol Ada, MD;  Location: [facility];  Service: Endoscopy;  Laterality: N/A;  . HEMORRHOID BANDING    . I & D EXTREMITY Left 06/01/2017   Procedure: IRRIGATION AND DEBRIDEMENT LEFT ARM HEMATOMA WITH LIGATION OF LEFT ARM AV FISTULA;  Surgeon: Elam Dutch, MD;  Location: [facility];  Service: Vascular;  Laterality: Left;  . I & D EXTREMITY Left 06/14/2017   Procedure: IRRIGATION AND DEBRIDEMENT EXTREMITY;  Surgeon: Katha Cabal, MD;  Location: [facility];  Service: Vascular;  Laterality: Left;  . INSERTION OF DIALYSIS CATHETER  05/30/2017  . INSERTION OF DIALYSIS CATHETER N/A 05/30/2017   Procedure: INSERTION OF DIALYSIS CATHETER;  Surgeon: Elam Dutch, MD;  Location: [facility];  Service: Vascular;  Laterality: N/A;  . IR PARACENTESIS  08/30/2017  . IR PARACENTESIS  09/29/2017  . IR PARACENTESIS  10/28/2017  . IR PARACENTESIS  11/09/2017  . IR PARACENTESIS  11/16/2017  . IR PARACENTESIS  11/28/2017  . IR PARACENTESIS  12/01/2017  . IR PARACENTESIS  12/06/2017  . IR PARACENTESIS  01/03/2018  . IR PARACENTESIS  01/23/2018  . IR PARACENTESIS  02/07/2018  . IR PARACENTESIS  02/21/2018  . IR PARACENTESIS  03/06/2018  . IR PARACENTESIS  03/17/2018  . IR PARACENTESIS  04/04/2018  . IR PARACENTESIS  12/28/2018  . IR PARACENTESIS  01/08/2019  . IR PARACENTESIS  01/23/2019  . IR PARACENTESIS  02/01/2019  . IR PARACENTESIS  02/19/2019  . IR PARACENTESIS  03/01/2019  . IR PARACENTESIS  03/15/2019  . IR PARACENTESIS  04/03/2019  . IR PARACENTESIS  04/12/2019  . IR PARACENTESIS  05/01/2019  . IR PARACENTESIS  05/08/2019  . IR PARACENTESIS  05/24/2019  . IR PARACENTESIS  06/12/2019  . IR PARACENTESIS  07/09/2019  . IR PARACENTESIS  07/27/2019  . IR PARACENTESIS  08/09/2019  . IR  PARACENTESIS  08/21/2019  . IR PARACENTESIS  09/17/2019  . IR PARACENTESIS  10/05/2019  . IR PARACENTESIS  10/29/2019  . IR PARACENTESIS  11/08/2019  . IR PARACENTESIS  12/12/2019  . IR RADIOLOGIST EVAL & MGMT  02/14/2018  . IR RADIOLOGIST EVAL & MGMT  02/22/2019  . LEFT HEART CATH AND CORONARY ANGIOGRAPHY N/A 02/22/2017   Procedure: LEFT HEART CATH AND CORONARY ANGIOGRAPHY;  Surgeon: Nigel Mormon, MD;  Location: [facility];  Service: Cardiovascular;  Laterality: N/A;  . LIGATION OF ARTERIOVENOUS  FISTULA Left 01/12/1286   Procedure: Plication of Left Arm Arteriovenous Fistula;  Surgeon: Elam Dutch, MD;  [facility];  Service: Vascular;  Laterality: Left;  . POLYPECTOMY    . POLYPECTOMY  01/25/2018   Procedure: POLYPECTOMY;  Surgeon: Jerene Bears, MD;  Location: [facility];  Service: Gastroenterology;;  . REVISON OF ARTERIOVENOUS FISTULA Left 01/28/9469   Procedure: PLICATION OF DISTAL ANEURYSMAL SEGEMENT OF LEFT UPPER ARM ARTERIOVENOUS FISTULA;  Surgeon: Elam Dutch, MD;  Location: [facility];  Service: Vascular;  Laterality: Left;  . REVISON OF ARTERIOVENOUS FISTULA Left 9/62/8366   Procedure: Plication of Left Upper Arm Fistula ;  Surgeon: Waynetta Sandy, MD;  Location: [facility];  Service: Vascular;  Laterality: Left;  . SKIN GRAFT SPLIT THICKNESS LEG / FOOT Left    SKIN GRAFT SPLIT THICKNESS LEFT ARM DONOR SITE: LEFT ANTERIOR THIGH  . SKIN SPLIT GRAFT Left 07/04/2017   Procedure: SKIN GRAFT SPLIT THICKNESS LEFT ARM DONOR SITE: LEFT ANTERIOR THIGH;  Surgeon: Elam Dutch, MD;  Location: [facility];  Service: Vascular;  Laterality: Left;  . THROMBECTOMY W/ EMBOLECTOMY Left 06/05/2017   Procedure: EXPLORATION OF LEFT ARM FOR BLEEDING; OVERSEWED PROXIMAL FISTULA;  Surgeon: Angelia Mould, MD;  Location: [facility];  Service: Vascular;  Laterality: Left;  . WOUND EXPLORATION Left 06/03/2017   Procedure: WOUND EXPLORATION WITH WOUND VAC APPLICATION TO LEFT ARM;  Surgeon:  Angelia Mould, MD;  Location: [facility] OR;  Service: Vascular;  Laterality: Left;       Family History  Problem Relation Age of Onset  . Heart disease Mother   . Lung cancer Mother   . Heart disease Father   . Malignant hyperthermia Father   . COPD Father   . Throat cancer Sister   . Esophageal cancer Sister   . Hypertension Other   . COPD Other   . Colon cancer Neg Hx   . Colon polyps Neg Hx   . Rectal cancer Neg Hx   . Stomach cancer Neg Hx     Social History   Tobacco Use  . Smoking status: Current Every Day Smoker    Packs/day: 0.50    Years: 43.00    Pack years: 21.50    Types: Cigarettes    Start date: 08/13/1973  . Smokeless tobacco: Never Used  . Tobacco comment: i dont know i just make   Vaping Use  . Vaping Use: Never used  Substance Use Topics  . Alcohol use: Not Currently    Comment: quit drinking in 2017  . Drug use: Not Currently    Types: Marijuana, Cocaine    Comment: reports using once every 3 months, 04-06-2019 was this     Home Medications Prior to Admission medications   Medication Sig Start Date End Date Taking? Authorizing Provider  amiodarone (PACERONE) 200 MG tablet Take 1 tablet (200 mg total) by mouth 2 (two) times daily. 11/26/19 12/26/19  McDonald, Mia A, PA-C  diltiazem (CARDIZEM CD) 240 MG 24 hr capsule TAKE 1 CAPSULE BY MOUTH EVERY DAY Patient taking differently: Take 240 mg by mouth daily.  11/05/19   Adrian Prows, MD  gabapentin (NEURONTIN) 100 MG capsule Take 1 capsule (100 mg total) by mouth 3 (three) times daily for 20 doses. 12/21/19 12/28/19  Curatolo, Adam, DO  hydrALAZINE (APRESOLINE) 100 MG tablet Take 100 mg by mouth 2 (two) times daily.    [provider]  hydrOXYzine (ATARAX/VISTARIL) 25 MG tablet Take 25 mg by mouth 2 (two) times daily as needed for itching.  08/21/19   [provider]  lactulose ([place])  10 GM/15ML solution Take 45 mLs (30 g total) by mouth daily as needed for mild constipation. 10/01/19    [name], MD  lidocaine (LIDODERM) 5 % Place 1 patch onto the skin daily. Remove & Discard patch within 12 hours or as directed by MD 10/28/19   McDonald, Mia A, PA-C  lidocaine (LIDODERM) 5 % Place 1 patch onto the skin daily for 10 doses. Remove & Discard patch within 12 hours or as directed by MD 12/21/19 12/31/19  Lennice Sites, DO  metoprolol tartrate (LOPRESSOR) 100 MG tablet Take 1 tablet (100 mg total) by mouth 2 (two) times daily. Patient not taking: Reported on 12/21/2019 06/04/19   Al Decant, MD  ondansetron (ZOFRAN) 4 MG tablet Take 4 mg by mouth every 4 (four) hours as needed for nausea or vomiting. Patient not taking: Reported on 12/21/2019 09/18/19   [provider]  pantoprazole (PROTONIX) 40 MG tablet Take 40 mg by mouth daily. Patient not taking: Reported on 12/21/2019    [provider]  sevelamer carbonate (RENVELA) 800 MG tablet Take 1,600-3,200 mg by mouth See admin instructions. Take 4 tablets (3200 mg) by mouth 3 times daily with meals and 2 tablets (1600 mg) with snacks    [provider]    Allergies    Tramadol, Morphine and related, Pollen extract, Acetaminophen, Aspirin, and Clonidine derivatives  Review of Systems   Review of Systems  Constitutional:       Per HPI, otherwise negative  HENT:       Per HPI, otherwise negative  Respiratory:       Per HPI, otherwise negative  Cardiovascular:       Per HPI, otherwise negative  Gastrointestinal: Positive for abdominal pain. Negative for vomiting.  Endocrine:       Negative aside from HPI  Genitourinary:       Neg aside from HPI   Musculoskeletal:       Per HPI, otherwise negative  Skin: Negative.   Allergic/Immunologic: Positive for immunocompromised state.  Neurological: Negative for syncope.    Physical Exam Updated Vital Signs BP 105/86   Pulse (!) 139   Temp 97.7 F (36.5 C) (Oral)   Resp 20   SpO2 98%   Physical Exam Vitals and nursing note reviewed.    Constitutional:      General: He is not in acute distress.    Appearance: He is well-developed. He is not ill-appearing or toxic-appearing.  HENT:     Head: Normocephalic and atraumatic.  Eyes:     Conjunctiva/sclera: Conjunctivae normal.  Cardiovascular:     Rate and Rhythm: Regular rhythm. Tachycardia present.  Pulmonary:     Effort: Pulmonary effort is normal. No respiratory distress.     Breath sounds: No stridor.  Abdominal:     General: There is no distension.     Tenderness: There is abdominal tenderness in the left upper quadrant. There is no guarding or rebound.     Comments: Minimal tenderness to palpation, no guarding, no rebound.  Skin:    General: Skin is warm and dry.  Neurological:     Mental Status: He is alert and oriented to person, place, and time.      ED Results / Procedures / Treatments   Labs (all labs ordered are listed, but only abnormal results are displayed) Labs Reviewed  COMPREHENSIVE METABOLIC PANEL  CBC WITH DIFFERENTIAL/PLATELET    EKG None  Radiology No results found.  Procedures Procedures (including critical care  time)  Medications Ordered in ED Medications  HYDROmorphone (DILAUDID) injection 1 mg (has no administration in time range)  prochlorperazine (COMPAZINE) injection 5 mg (has no administration in time range)    ED Course  I have reviewed the triage vital signs and the nursing notes.  Pertinent labs & imaging results that were available during my care of the patient were reviewed by me and considered in my medical decision making (see chart for details).     After the initial evaluation I reviewed the patient's chart including documentation from visit earlier today. Patient's visit earlier today was generally reassuring, no evidence for peritonitis.   Given the patient's tachycardia, pain, infection is on the differential, though he is afebrile, with no vomiting, has a history of chronic abdominal pain, there are  some suspicion for withdrawal contributing to his discomfort as well.  With his substantial comorbidities, x-ray, labs ordered, analgesia, anti-nausea medicine ordered as well.   7:22 PM Patient sleeping. Repeat EKG with diminished rate, now 131, sinus tach, left bundle branch block, abnormal  9:13 PM Patient remains tachycardic, tachypneic, with 2 L nasal cannula. Given his congestion visible on x-ray, persistent tachycardia, tachypnea, I discussed this case with our nephrology colleagues, and they will arrange for a.m. dialysis. In regards to the patient's hyperkalemia he has received Lokelma, albuterol.  This adult male with multiple medical issues, most notably end-stage renal disease, chronic pain, now presents with ongoing pain, as well as tachypnea, tachycardia, and concern for pulmonary congestion. Patient improved here with albuterol, oxygen, analgesia, but given persistent vital sign abnormalities, hyperkalemia, after conversation with our nephrology colleagues, the patient was admitted to our internal medicine colleagues for ongoing care, monitoring, management.   MDM Rules/Calculators/A&P MDM Number of Diagnoses or Management Options Hyperkalemia: new, needed workup Left upper quadrant abdominal pain: new, needed workup Pulmonary congestion: new, needed workup   Amount and/or Complexity of Data Reviewed Clinical lab tests: reviewed Tests in the radiology section of CPT: reviewed Tests in the medicine section of CPT: reviewed Decide to obtain previous medical records or to obtain history from someone other than the patient: yes Obtain history from someone other than the patient: yes Review and summarize past medical records: yes Discuss the patient with other providers: yes Independent visualization of images, tracings, or specimens: yes  Risk of Complications, Morbidity, and/or Mortality Presenting problems: high Diagnostic procedures: high Management options:  high  Critical Care Total time providing critical care: < 30 minutes  Patient Progress Patient progress: improved  Final Clinical Impression(s) / ED Diagnoses Final diagnoses:  Pulmonary congestion  Hyperkalemia  Left upper quadrant abdominal pain      Carmin Muskrat, MD 12/21/19 2115

## 2019-12-21 NOTE — ED Provider Notes (Signed)
[name] DE[facility]MENT Provider Note   CSN: [id number] Arrival date & time: 12/21/19  0102     History Chief Complaint  Patient presents with  . Abdominal Pain    [name] is a 59 y.o. male.  The history is provided by the patient.  Abdominal Pain Pain location:  LLQ Pain quality: aching   Pain radiates to:  Does not radiate Pain severity:  Mild Onset quality:  Gradual Timing:  Intermittent Progression:  Waxing and waning Chronicity:  New Relieved by:  Nothing Worsened by:  Nothing Associated symptoms: diarrhea and nausea   Associated symptoms: no anorexia, no chest pain, no chills, no cough, no dysuria, no fever, no hematuria, no shortness of breath, no sore throat and no vomiting        Past Medical History:  Diagnosis Date  . Anemia   . Anxiety   . Arthritis    left shoulder  . Atherosclerosis of aorta ([place])   . Cardiomegaly   . Chest pain    DATE UNKNOWN, C/O PERIODICALLY  . Cocaine abuse ([place])   . COPD exacerbation ([place]) 08/17/2016  . Coronary artery disease    stent 02/22/17  . ESRD (end stage renal disease) on dialysis ([place])    "[name]; MWF" (07/04/2017)  . GERD (gastroesophageal reflux disease)    DATE UNKNOWN  . Hemorrhoids   . Hepatitis B, chronic ([place])   . Hepatitis C   . History of kidney stones   . Hyperkalemia   . Hypertension   . Kidney failure   . Metabolic bone disease    Patient denies  . Mitral stenosis   . Myocardial infarction ([place])   . Pneumonia   . Pulmonary edema   . Solitary rectal ulcer syndrome 07/2017   at [facility] for rectal bleeding  . Tubular adenoma of colon     Patient Active Problem List   Diagnosis Date Noted  . Acetaminophen overdose 10/27/2019  . ESRD (end stage renal disease) ([place])   . Goals of care, counseling/discussion   . Hypoxia 10/04/2019  . Advanced care planning/counseling discussion   . Renal failure 09/26/2019  . Dyspnea 06/09/2019  . Acquired  thrombophilia ([place]) 06/05/2019  . A-fib ([place]) 05/30/2019  . Atrial fibrillation with RVR ([place]) 05/29/2019  . Melena   . Pressure injury of skin 03/09/2019  . Abdominal distention   . Volume overload 12/28/2018  . Sepsis ([place]) 09/12/2018  . Atherosclerosis of native coronary artery of native heart without angina pectoris 03/11/2018  . Benign neoplasm of cecum   . Benign neoplasm of ascending colon   . Benign neoplasm of descending colon   . Benign neoplasm of rectum   . Paroxysmal atrial fibrillation ([place]) 01/23/2018  . Hx of colonic polyps 01/20/2018  . End-stage renal disease on hemodialysis ([place]) 11/21/2017  . GERD (gastroesophageal reflux disease) 11/16/2017  . Decompensated hepatic cirrhosis ([place]) 11/15/2017  . DNR (do not resuscitate)   . Palliative care by specialist   . Hyponatremia 11/04/2017  . SBP (spontaneous bacterial peritonitis) ([place]) 10/30/2017  . Liver disease, chronic 10/30/2017  . SOB (shortness of breath)   . Abdominal pain 10/28/2017  . Upper airway cough syndrome with flattening on f/v loop 10/13/17 c/w vcd 10/17/2017  . Elevated diaphragm 10/13/2017  . Ileus ([place]) 09/29/2017  . QT prolongation 09/29/2017  . Malnutrition of moderate degree 09/29/2017  . Sinus congestion 09/03/2017  . Symptomatic anemia 09/02/2017  . Other cirrhosis of liver ([place])  09/02/2017  . Left bundle branch block 09/02/2017  . Mitral stenosis 09/02/2017  . Hematochezia 07/15/2017  . Wide-complex tachycardia ([place])   . Endotracheally intubated   . ESRD on dialysis ([place]) 07/04/2017  . Acute respiratory failure with hypoxia ([place]) 06/18/2017  . CKD (chronic kidney disease) stage V requiring chronic dialysis ([place]) 06/18/2017  . History of Cocaine abuse ([place]) 06/18/2017  . Hypertension 06/18/2017  . Infection of AV graft for dialysis ([place]) 06/18/2017  . Anxiety 06/18/2017  . Anemia due to chronic kidney disease 06/18/2017  . Atypical atrial flutter ([place]) 06/18/2017  . Personality  disorder ([place]) 06/13/2017  . Cellulitis 06/12/2017  . Adjustment disorder with mixed anxiety and depressed mood 06/10/2017  . Suicidal ideation 06/10/2017  . Arm wound, left, sequela 06/10/2017  . Dyspnea on exertion 05/29/2017  . Tachycardia 05/29/2017  . Hyperkalemia 05/22/2017  . Acute metabolic encephalopathy   . Anemia 04/23/2017  . Ascites 04/23/2017  . COPD (chronic obstructive pulmonary disease) ([place]) 04/23/2017  . Acute on chronic respiratory failure with hypoxia ([place]) 03/25/2017  . Arrhythmia 03/25/2017  . COPD GOLD 0 with flattening on inps f/v  09/27/2016  . Essential hypertension 09/27/2016  . Fluid overload 08/30/2016  . COPD exacerbation ([place]) 08/17/2016  . Hypertensive urgency 08/17/2016  . Respiratory failure ([place]) 08/17/2016  . Problem with dialysis access ([place]) 07/23/2016  . Chronic hepatitis B ([place]) 03/05/2014  . Chronic hepatitis C without hepatic coma ([place]) 03/05/2014  . Internal hemorrhoids with bleeding, swelling and itching 03/05/2014  . Thrombocytopenia ([place]) 03/05/2014  . Chest pain 02/27/2014  . Alcohol abuse 04/14/2009  . Cigarette smoker 04/14/2009  . GANGLION CYST 04/14/2009    Past Surgical History:  Procedure Laterality Date  . A/V FISTULAGRAM Left 05/26/2017   Procedure: A/V FISTULAGRAM;  Surgeon: Conrad , MD;  Location: [place] [facility];  Service: Cardiovascular;  Laterality: Left;  . A/V FISTULAGRAM Right 11/18/2017   Procedure: A/V FISTULAGRAM - Right Arm;  Surgeon: Elam Dutch, MD;  Location: [place] [facility];  Service: Cardiovascular;  Laterality: Right;  . APPLICATION OF WOUND VAC Left 06/14/2017   Procedure: APPLICATION OF WOUND VAC;  Surgeon: Katha Cabal, MD;  Location: [facility];  Service: Vascular;  Laterality: Left;  . AV FISTULA PLACEMENT  2012   BELIEVED WAS PLACED IN JUNE  . AV FISTULA PLACEMENT Right 08/09/2017   Procedure: Creation Right arm ARTERIOVENOUS BRACHIOCEPOHALIC FISTULA;  Surgeon: Elam Dutch, MD;  Location: [facility] OR;  Service: Vascular;  Laterality: Right;  . AV FISTULA PLACEMENT Right 11/22/2017   Procedure: INSERTION OF ARTERIOVENOUS (AV) GORE-TEX GRAFT RIGHT UPPER ARM;  Surgeon: Elam Dutch, MD;  Location: [place];  Service: Vascular;  Laterality: Right;  . BIOPSY  01/25/2018   Procedure: BIOPSY;  Surgeon: Jerene Bears, MD;  Location: [place];  Service: Gastroenterology;;  . BIOPSY  04/10/2019   Procedure: BIOPSY;  Surgeon: Jerene Bears, MD;  Location: [facility];  Service: Gastroenterology;;  . COLONOSCOPY    . COLONOSCOPY WITH PROPOFOL N/A 01/25/2018   Procedure: COLONOSCOPY WITH PROPOFOL;  Surgeon: Jerene Bears, MD;  Location: [place];  Service: Gastroenterology;  Laterality: N/A;  . CORONARY STENT INTERVENTION N/A 02/22/2017   Procedure: CORONARY STENT INTERVENTION;  Surgeon: Nigel Mormon, MD;  Location: [place] [facility];  Service: Cardiovascular;  Laterality: N/A;  . ESOPHAGOGASTRODUODENOSCOPY (EGD) WITH PROPOFOL N/A 01/25/2018   Procedure: ESOPHAGOGASTRODUODENOSCOPY (EGD) WITH PROPOFOL;  Surgeon: Jerene Bears, MD;  Location:  [facility];  Service: Gastroenterology;  Laterality: N/A;  . ESOPHAGOGASTRODUODENOSCOPY (EGD) WITH PROPOFOL N/A 04/10/2019   Procedure: ESOPHAGOGASTRODUODENOSCOPY (EGD) WITH PROPOFOL;  Surgeon: Jerene Bears, MD;  Location: [facility];  Service: Gastroenterology;  Laterality: N/A;  . FLEXIBLE SIGMOIDOSCOPY N/A 07/15/2017   Procedure: FLEXIBLE SIGMOIDOSCOPY;  Surgeon: Carol Ada, MD;  Location: [place];  Service: Endoscopy;  Laterality: N/A;  . HEMORRHOID BANDING    . I & D EXTREMITY Left 06/01/2017   Procedure: IRRIGATION AND DEBRIDEMENT LEFT ARM HEMATOMA WITH LIGATION OF LEFT ARM AV FISTULA;  Surgeon: Elam Dutch, MD;  Location: [place] [facility];  Service: Vascular;  Laterality: Left;  . I & D EXTREMITY Left 06/14/2017   Procedure: IRRIGATION AND DEBRIDEMENT EXTREMITY;  Surgeon: Katha Cabal, MD;  Location: [facility];   Service: Vascular;  Laterality: Left;  . INSERTION OF DIALYSIS CATHETER  05/30/2017  . INSERTION OF DIALYSIS CATHETER N/A 05/30/2017   Procedure: INSERTION OF DIALYSIS CATHETER;  Surgeon: Elam Dutch, MD;  Location: [place];  Service: Vascular;  Laterality: N/A;  . IR PARACENTESIS  08/30/2017  . IR PARACENTESIS  09/29/2017  . IR PARACENTESIS  10/28/2017  . IR PARACENTESIS  11/09/2017  . IR PARACENTESIS  11/16/2017  . IR PARACENTESIS  11/28/2017  . IR PARACENTESIS  12/01/2017  . IR PARACENTESIS  12/06/2017  . IR PARACENTESIS  01/03/2018  . IR PARACENTESIS  01/23/2018  . IR PARACENTESIS  02/07/2018  . IR PARACENTESIS  02/21/2018  . IR PARACENTESIS  03/06/2018  . IR PARACENTESIS  03/17/2018  . IR PARACENTESIS  04/04/2018  . IR PARACENTESIS  12/28/2018  . IR PARACENTESIS  01/08/2019  . IR PARACENTESIS  01/23/2019  . IR PARACENTESIS  02/01/2019  . IR PARACENTESIS  02/19/2019  . IR PARACENTESIS  03/01/2019  . IR PARACENTESIS  03/15/2019  . IR PARACENTESIS  04/03/2019  . IR PARACENTESIS  04/12/2019  . IR PARACENTESIS  05/01/2019  . IR PARACENTESIS  05/08/2019  . IR PARACENTESIS  05/24/2019  . IR PARACENTESIS  06/12/2019  . IR PARACENTESIS  07/09/2019  . IR PARACENTESIS  07/27/2019  . IR PARACENTESIS  08/09/2019  . IR PARACENTESIS  08/21/2019  . IR PARACENTESIS  09/17/2019  . IR PARACENTESIS  10/05/2019  . IR PARACENTESIS  10/29/2019  . IR PARACENTESIS  11/08/2019  . IR PARACENTESIS  12/12/2019  . IR RADIOLOGIST EVAL & MGMT  02/14/2018  . IR RADIOLOGIST EVAL & MGMT  02/22/2019  . LEFT HEART CATH AND CORONARY ANGIOGRAPHY N/A 02/22/2017   Procedure: LEFT HEART CATH AND CORONARY ANGIOGRAPHY;  Surgeon: Nigel Mormon, MD;  Location: [place] [facility];  Service: Cardiovascular;  Laterality: N/A;  . LIGATION OF ARTERIOVENOUS  FISTULA Left 10/10/5828   Procedure: Plication of Left Arm Arteriovenous Fistula;  Surgeon: Elam Dutch, MD;  Location: [place];  Service: Vascular;  Laterality: Left;  . POLYPECTOMY    .  POLYPECTOMY  01/25/2018   Procedure: POLYPECTOMY;  Surgeon: Jerene Bears, MD;  Location: [place];  Service: Gastroenterology;;  . REVISON OF ARTERIOVENOUS FISTULA Left 9/40/7680   Procedure: PLICATION OF DISTAL ANEURYSMAL SEGEMENT OF LEFT UPPER ARM ARTERIOVENOUS FISTULA;  Surgeon: Elam Dutch, MD;  Location: [place];  Service: Vascular;  Laterality: Left;  . REVISON OF ARTERIOVENOUS FISTULA Left 8/81/1031   Procedure: Plication of Left Upper Arm Fistula ;  Surgeon: Waynetta Sandy, MD;  Location: [facility];  Service: Vascular;  Laterality: Left;  . SKIN GRAFT SPLIT THICKNESS LEG /  FOOT Left    SKIN GRAFT SPLIT THICKNESS LEFT ARM DONOR SITE: LEFT ANTERIOR THIGH  . SKIN SPLIT GRAFT Left 07/04/2017   Procedure: SKIN GRAFT SPLIT THICKNESS LEFT ARM DONOR SITE: LEFT ANTERIOR THIGH;  Surgeon: Elam Dutch, MD;  Location: [place];  Service: Vascular;  Laterality: Left;  . THROMBECTOMY W/ EMBOLECTOMY Left 06/05/2017   Procedure: EXPLORATION OF LEFT ARM FOR BLEEDING; OVERSEWED PROXIMAL FISTULA;  Surgeon: Angelia Mould, MD;  Location: [place];  Service: Vascular;  Laterality: Left;  . WOUND EXPLORATION Left 06/03/2017   Procedure: WOUND EXPLORATION WITH WOUND VAC APPLICATION TO LEFT ARM;  Surgeon: Angelia Mould, MD;  Location: [facility] OR;  Service: Vascular;  Laterality: Left;       Family History  Problem Relation Age of Onset  . Heart disease Mother   . Lung cancer Mother   . Heart disease Father   . Malignant hyperthermia Father   . COPD Father   . Throat cancer Sister   . Esophageal cancer Sister   . Hypertension Other   . COPD Other   . Colon cancer Neg Hx   . Colon polyps Neg Hx   . Rectal cancer Neg Hx   . Stomach cancer Neg Hx     Social History   Tobacco Use  . Smoking status: Current Every Day Smoker    Packs/day: 0.50    Years: 43.00    Pack years: 21.50    Types: Cigarettes    Start date: 08/13/1973  . Smokeless tobacco: Never Used  . Tobacco  comment: i dont know i just make   Vaping Use  . Vaping Use: Never used  Substance Use Topics  . Alcohol use: Not Currently    Comment: quit drinking in 2017  . Drug use: Not Currently    Types: Marijuana, Cocaine    Comment: reports using once every 3 months, 04-06-2019 was this     Home Medications Prior to Admission medications   Medication Sig Start Date End Date Taking? Authorizing Provider  amiodarone (PACERONE) 200 MG tablet Take 1 tablet (200 mg total) by mouth 2 (two) times daily. 11/26/19 12/26/19 Yes McDonald, Mia A, PA-C  diltiazem (CARDIZEM CD) 240 MG 24 hr capsule TAKE 1 CAPSULE BY MOUTH EVERY DAY Patient taking differently: Take 240 mg by mouth daily.  11/05/19  Yes Adrian Prows, MD  hydrALAZINE (APRESOLINE) 100 MG tablet Take 100 mg by mouth 2 (two) times daily.   Yes [provider]  lactulose (CHRONULAC) 10 GM/15ML solution Take 45 mLs (30 g total) by mouth daily as needed for mild constipation. 10/01/19  Yes [name], MD  sevelamer carbonate (RENVELA) 800 MG tablet Take 1,600-3,200 mg by mouth See admin instructions. Take 4 tablets (3200 mg) by mouth 3 times daily with meals and 2 tablets (1600 mg) with snacks   Yes [provider]  hydrOXYzine (ATARAX/VISTARIL) 25 MG tablet Take 25 mg by mouth 2 (two) times daily as needed for itching.  08/21/19   [provider]  lidocaine (LIDODERM) 5 % Place 1 patch onto the skin daily. Remove & Discard patch within 12 hours or as directed by MD Patient not taking: Reported on 12/18/2019 10/28/19   McDonald, Mia A, PA-C  metoprolol tartrate (LOPRESSOR) 100 MG tablet Take 1 tablet (100 mg total) by mouth 2 (two) times daily. Patient not taking: Reported on 12/21/2019 06/04/19   Al Decant, MD  ondansetron (ZOFRAN) 4 MG tablet Take 4 mg by mouth  every 4 (four) hours as needed for nausea or vomiting. Patient not taking: Reported on 12/21/2019 09/18/19   [provider]  pantoprazole (PROTONIX) 40 MG  tablet Take 40 mg by mouth daily. Patient not taking: Reported on 12/21/2019    [provider]    Allergies    Tramadol, Morphine and related, Pollen extract, Acetaminophen, Aspirin, and Clonidine derivatives  Review of Systems   Review of Systems  Constitutional: Negative for chills and fever.  HENT: Negative for ear pain and sore throat.   Eyes: Negative for pain and visual disturbance.  Respiratory: Negative for cough and shortness of breath.   Cardiovascular: Negative for chest pain and palpitations.  Gastrointestinal: Positive for abdominal pain, diarrhea and nausea. Negative for anorexia and vomiting.  Genitourinary: Negative for dysuria and hematuria.  Musculoskeletal: Negative for arthralgias and back pain.  Skin: Negative for color change and rash.  Neurological: Negative for seizures and syncope.  All other systems reviewed and are negative.   Physical Exam Updated Vital Signs  ED Triage Vitals  Enc Vitals Group     BP 12/21/19 0102 (!) 155/103     Pulse Rate 12/21/19 0102 83     Resp 12/21/19 0102 (!) 22     Temp 12/21/19 0102 98.3 F (36.8 C)     Temp Source 12/21/19 0102 Oral     SpO2 12/21/19 0102 98 %     Weight --      Height --      Head Circumference --      Peak Flow --      Pain Score 12/21/19 0110 10     Pain Loc --      Pain Edu? --      Excl. in [place]? --     Physical Exam Vitals and nursing note reviewed.  Constitutional:      General: He is not in acute distress.    Appearance: He is well-developed. He is not ill-appearing.  HENT:     Head: Normocephalic and atraumatic.     Mouth/Throat:     Mouth: Mucous membranes are moist.  Eyes:     Extraocular Movements: Extraocular movements intact.     Conjunctiva/sclera: Conjunctivae normal.     Pupils: Pupils are equal, round, and reactive to light.  Cardiovascular:     Rate and Rhythm: Normal rate and regular rhythm.     Heart sounds: Normal heart sounds. No murmur heard.    Pulmonary:     Effort: Pulmonary effort is normal. No respiratory distress.     Breath sounds: Normal breath sounds.  Abdominal:     General: Abdomen is flat.     Palpations: Abdomen is soft.     Tenderness: There is no abdominal tenderness. There is no right CVA tenderness, left CVA tenderness, guarding or rebound. Negative signs include Murphy's sign, Rovsing's sign, McBurney's sign and psoas sign.  Musculoskeletal:     Cervical back: Neck supple.  Skin:    General: Skin is warm and dry.  Neurological:     General: No focal deficit present.     Mental Status: He is alert and oriented to person, place, and time.     Cranial Nerves: No cranial nerve deficit.     Motor: No weakness.     ED Results / Procedures / Treatments   Labs (all labs ordered are listed, but only abnormal results are displayed) Labs Reviewed - No data to display  EKG None  Radiology No results  found.  Procedures Procedures (including critical care time)  Medications Ordered in ED Medications  HYDROmorphone (DILAUDID) injection 1 mg (1 mg Intramuscular Given 12/21/19 0749)    ED Course  I have reviewed the triage vital signs and the nursing notes.  Pertinent labs & imaging results that were available during my care of the patient were reviewed by me and considered in my medical decision making (see chart for details).    MDM Rules/Calculators/A&P                          [name] is a 59 year old male with history of COPD, CAD, chronic pain, cirrhosis who presents to the ED with nausea, diarrhea, abdominal cramping.  Patient with unremarkable vitals.  No fever.  Patient states that he ran out of his chronic narcotic medicine.  He can get a refill for several days.  Has been about 2 days since he had a dose.  Having some left lower abdominal cramping, diarrhea, nausea.  He is requesting IM Dilaudid.  Does not have any true abdominal tenderness on exam.  No signs of respiratory  distress.  No fever.  Doubt any intra-abdominal process such as SBP or bowel obstruction.  He overall appears comfortable.  Upon review of opioid database patient does get monthly prescriptions of oxycodone.  It looks like his next prescription would be due in about 6 days.  He states that he is been unable to get into his chronic pain doctor.  I believe that is likely having some minor withdrawal symptoms.  We will give him a dose of narcotics and have him follow-up with his primary care doctor.  Discharged in ED in good condition.  This chart was dictated using voice recognition software.  Despite best efforts to proofread,  errors can occur which can change the documentation meaning.    Final Clinical Impression(s) / ED Diagnoses Final diagnoses:  Nausea    Rx / DC Orders ED Discharge Orders    None       Lennice Sites, DO 12/21/19 0233

## 2019-12-21 NOTE — ED Notes (Signed)
Pt was seen going outside earlier, this NT was looking for pt for VS and is not seen in waiting room or outside.

## 2019-12-21 NOTE — ED Triage Notes (Addendum)
Pt said abdominal pain x 1 day on left side. Pt in triage eating candy. Pt was here and went home and called ems. Pt had labs drawn earlier also

## 2019-12-22 ENCOUNTER — Other Ambulatory Visit (HOSPITAL_COMMUNITY): Payer: Medicare Other

## 2019-12-22 ENCOUNTER — Observation Stay (HOSPITAL_COMMUNITY): Payer: Medicare Other

## 2019-12-22 DIAGNOSIS — Z9115 Patient's noncompliance with renal dialysis: Secondary | ICD-10-CM | POA: Diagnosis not present

## 2019-12-22 DIAGNOSIS — N186 End stage renal disease: Secondary | ICD-10-CM | POA: Diagnosis present

## 2019-12-22 DIAGNOSIS — R1012 Left upper quadrant pain: Secondary | ICD-10-CM | POA: Diagnosis not present

## 2019-12-22 DIAGNOSIS — I132 Hypertensive heart and chronic kidney disease with heart failure and with stage 5 chronic kidney disease, or end stage renal disease: Secondary | ICD-10-CM | POA: Diagnosis present

## 2019-12-22 DIAGNOSIS — R188 Other ascites: Secondary | ICD-10-CM | POA: Diagnosis present

## 2019-12-22 DIAGNOSIS — Z992 Dependence on renal dialysis: Secondary | ICD-10-CM | POA: Diagnosis not present

## 2019-12-22 DIAGNOSIS — K652 Spontaneous bacterial peritonitis: Secondary | ICD-10-CM | POA: Diagnosis present

## 2019-12-22 DIAGNOSIS — R64 Cachexia: Secondary | ICD-10-CM | POA: Diagnosis present

## 2019-12-22 DIAGNOSIS — E875 Hyperkalemia: Secondary | ICD-10-CM | POA: Diagnosis present

## 2019-12-22 DIAGNOSIS — Z6820 Body mass index (BMI) 20.0-20.9, adult: Secondary | ICD-10-CM | POA: Diagnosis not present

## 2019-12-22 DIAGNOSIS — I480 Paroxysmal atrial fibrillation: Secondary | ICD-10-CM | POA: Diagnosis present

## 2019-12-22 DIAGNOSIS — J810 Acute pulmonary edema: Secondary | ICD-10-CM | POA: Diagnosis present

## 2019-12-22 DIAGNOSIS — K219 Gastro-esophageal reflux disease without esophagitis: Secondary | ICD-10-CM | POA: Diagnosis present

## 2019-12-22 DIAGNOSIS — Z66 Do not resuscitate: Secondary | ICD-10-CM | POA: Diagnosis present

## 2019-12-22 DIAGNOSIS — J9601 Acute respiratory failure with hypoxia: Secondary | ICD-10-CM | POA: Diagnosis present

## 2019-12-22 DIAGNOSIS — A419 Sepsis, unspecified organism: Secondary | ICD-10-CM | POA: Diagnosis present

## 2019-12-22 DIAGNOSIS — F17210 Nicotine dependence, cigarettes, uncomplicated: Secondary | ICD-10-CM | POA: Diagnosis present

## 2019-12-22 DIAGNOSIS — J449 Chronic obstructive pulmonary disease, unspecified: Secondary | ICD-10-CM

## 2019-12-22 DIAGNOSIS — I2510 Atherosclerotic heart disease of native coronary artery without angina pectoris: Secondary | ICD-10-CM | POA: Diagnosis present

## 2019-12-22 DIAGNOSIS — B181 Chronic viral hepatitis B without delta-agent: Secondary | ICD-10-CM | POA: Diagnosis present

## 2019-12-22 DIAGNOSIS — K7469 Other cirrhosis of liver: Secondary | ICD-10-CM | POA: Diagnosis present

## 2019-12-22 DIAGNOSIS — Z20822 Contact with and (suspected) exposure to covid-19: Secondary | ICD-10-CM | POA: Diagnosis present

## 2019-12-22 DIAGNOSIS — R0989 Other specified symptoms and signs involving the circulatory and respiratory systems: Secondary | ICD-10-CM | POA: Diagnosis present

## 2019-12-22 DIAGNOSIS — I252 Old myocardial infarction: Secondary | ICD-10-CM | POA: Diagnosis not present

## 2019-12-22 DIAGNOSIS — I10 Essential (primary) hypertension: Secondary | ICD-10-CM

## 2019-12-22 DIAGNOSIS — D631 Anemia in chronic kidney disease: Secondary | ICD-10-CM | POA: Diagnosis present

## 2019-12-22 DIAGNOSIS — N2581 Secondary hyperparathyroidism of renal origin: Secondary | ICD-10-CM | POA: Diagnosis present

## 2019-12-22 LAB — BASIC METABOLIC PANEL
Anion gap: 21 — ABNORMAL HIGH (ref 5–15)
Anion gap: 22 — ABNORMAL HIGH (ref 5–15)
BUN: 55 mg/dL — ABNORMAL HIGH (ref 6–20)
BUN: 56 mg/dL — ABNORMAL HIGH (ref 6–20)
CO2: 21 mmol/L — ABNORMAL LOW (ref 22–32)
CO2: 22 mmol/L (ref 22–32)
Calcium: 9.6 mg/dL (ref 8.9–10.3)
Calcium: 9.5 mg/dL (ref 8.9–10.3)
Chloride: 89 mmol/L — ABNORMAL LOW (ref 98–111)
Chloride: 88 mmol/L — ABNORMAL LOW (ref 98–111)
Creatinine, Ser: 6.7 mg/dL — ABNORMAL HIGH (ref 0.61–1.24)
Creatinine, Ser: 6.92 mg/dL — ABNORMAL HIGH (ref 0.61–1.24)
GFR calc Af Amer: 10 mL/min — ABNORMAL LOW (ref >60)
GFR calc Af Amer: 9 mL/min — ABNORMAL LOW (ref >60)
GFR calc non Af Amer: 8 mL/min — ABNORMAL LOW (ref >60)
GFR calc non Af Amer: 8 mL/min — ABNORMAL LOW (ref >60)
Glucose, Bld: 55 mg/dL — ABNORMAL LOW (ref 70–99)
Glucose, Bld: 46 mg/dL — ABNORMAL LOW (ref 70–99)
Potassium: 6.2 mmol/L — ABNORMAL HIGH (ref 3.5–5.1)
Potassium: 5.7 mmol/L — ABNORMAL HIGH (ref 3.5–5.1)
Sodium: 131 mmol/L — ABNORMAL LOW (ref 135–145)
Sodium: 132 mmol/L — ABNORMAL LOW (ref 135–145)

## 2019-12-22 LAB — CBC WITH DIFFERENTIAL/PLATELET
Abs Immature Granulocytes: 0.08 10*3/uL — ABNORMAL HIGH (ref 0.00–0.07)
Basophils Absolute: 0.1 10*3/uL (ref 0.0–0.1)
Basophils Relative: 1 %
Eosinophils Absolute: 0.1 10*3/uL (ref 0.0–0.5)
Eosinophils Relative: 1 %
HCT: 29.6 % — ABNORMAL LOW (ref 39.0–52.0)
Hemoglobin: 10 g/dL — ABNORMAL LOW (ref 13.0–17.0)
Immature Granulocytes: 1 %
Lymphocytes Relative: 10 %
Lymphs Abs: 1.2 10*3/uL (ref 0.7–4.0)
MCH: 28.4 pg (ref 26.0–34.0)
MCHC: 33.8 g/dL (ref 30.0–36.0)
MCV: 84.1 fL (ref 80.0–100.0)
Monocytes Absolute: 1 10*3/uL (ref 0.1–1.0)
Monocytes Relative: 8 %
Neutro Abs: 10.2 10*3/uL — ABNORMAL HIGH (ref 1.7–7.7)
Neutrophils Relative %: 79 %
Platelets: 281 10*3/uL (ref 150–400)
RBC: 3.52 MIL/uL — ABNORMAL LOW (ref 4.22–5.81)
RDW: 16.6 % — ABNORMAL HIGH (ref 11.5–15.5)
WBC: 12.7 10*3/uL — ABNORMAL HIGH (ref 4.0–10.5)
nRBC: 0.6 % — ABNORMAL HIGH (ref 0.0–0.2)

## 2019-12-22 LAB — GLUCOSE, CAPILLARY
Glucose-Capillary: 41 mg/dL — CL (ref 70–99)
Glucose-Capillary: 99 mg/dL (ref 70–99)
Glucose-Capillary: 85 mg/dL (ref 70–99)

## 2019-12-22 LAB — PROTEIN, PLEURAL OR PERITONEAL FLUID: Total protein, fluid: 4.1 g/dL

## 2019-12-22 LAB — GRAM STAIN

## 2019-12-22 LAB — CBC
HCT: 29.2 % — ABNORMAL LOW (ref 39.0–52.0)
Hemoglobin: 9.7 g/dL — ABNORMAL LOW (ref 13.0–17.0)
MCH: 28.1 pg (ref 26.0–34.0)
MCHC: 33.2 g/dL (ref 30.0–36.0)
MCV: 84.6 fL (ref 80.0–100.0)
Platelets: 234 10*3/uL (ref 150–400)
RBC: 3.45 MIL/uL — ABNORMAL LOW (ref 4.22–5.81)
RDW: 16.8 % — ABNORMAL HIGH (ref 11.5–15.5)
WBC: 11.4 10*3/uL — ABNORMAL HIGH (ref 4.0–10.5)
nRBC: 0.4 % — ABNORMAL HIGH (ref 0.0–0.2)

## 2019-12-22 LAB — ALBUMIN, PLEURAL OR PERITONEAL FLUID: Albumin, Fluid: 1.7 g/dL

## 2019-12-22 LAB — BODY FLUID CELL COUNT WITH DIFFERENTIAL
Eos, Fluid: 0 %
Lymphs, Fluid: 15 %
Monocyte-Macrophage-Serous Fluid: 29 % — ABNORMAL LOW (ref 50–90)
Neutrophil Count, Fluid: 56 % — ABNORMAL HIGH (ref 0–25)
Total Nucleated Cell Count, Fluid: 583 cu mm (ref 0–1000)

## 2019-12-22 LAB — D-DIMER, QUANTITATIVE: D-Dimer, Quant: >20 ug/mL-FEU — ABNORMAL HIGH (ref 0.00–0.50)

## 2019-12-22 MED ORDER — ALTEPLASE 2 MG IJ SOLR: 2.0000 mg | Freq: Once | INTRAMUSCULAR | Status: DC | PRN

## 2019-12-22 MED ORDER — DEXTROSE 50 % IV SOLN
INTRAVENOUS | Status: AC
  Filled 2019-12-22: qty 50

## 2019-12-22 MED ORDER — MORPHINE SULFATE (PF) 2 MG/ML IV SOLN
1.0000 mg | Freq: Once | INTRAVENOUS | Status: AC
  Administered 2019-12-22: 1 mg via INTRAVENOUS
  Filled 2019-12-22: qty 1

## 2019-12-22 MED ORDER — SODIUM CHLORIDE 0.9 % IV SOLN
2.0000 g | INTRAVENOUS | Status: DC
  Administered 2019-12-23 – 2019-12-24 (×2): 2 g via INTRAVENOUS
  Filled 2019-12-22: qty 20
  Filled 2019-12-22: qty 2

## 2019-12-22 MED ORDER — SODIUM CHLORIDE 0.9 % IV SOLN: 100.0000 mL | INTRAVENOUS | Status: DC | PRN

## 2019-12-22 MED ORDER — PENTAFLUOROPROP-TETRAFLUOROETH EX AERO: 1.0000 "application " | INHALATION_SPRAY | CUTANEOUS | Status: DC | PRN

## 2019-12-22 MED ORDER — MORPHINE SULFATE (PF) 2 MG/ML IV SOLN
0.5000 mg | Freq: Once | INTRAVENOUS | Status: AC
  Administered 2019-12-22: 0.5 mg via INTRAVENOUS

## 2019-12-22 MED ORDER — HEPARIN SODIUM (PORCINE) 1000 UNIT/ML DIALYSIS: 1000.0000 [IU] | INTRAMUSCULAR | Status: DC | PRN

## 2019-12-22 MED ORDER — ONDANSETRON HCL 4 MG/2ML IJ SOLN
4.0000 mg | Freq: Four times a day (QID) | INTRAMUSCULAR | Status: DC | PRN
  Administered 2019-12-22 – 2019-12-24 (×2): 4 mg via INTRAVENOUS
  Filled 2019-12-22: qty 2

## 2019-12-22 MED ORDER — LIDOCAINE HCL (PF) 1 % IJ SOLN: 5.0000 mL | INTRAMUSCULAR | Status: DC | PRN

## 2019-12-22 MED ORDER — LIDOCAINE-PRILOCAINE 2.5-2.5 % EX CREA: 1.0000 "application " | TOPICAL_CREAM | CUTANEOUS | Status: DC | PRN

## 2019-12-22 MED ORDER — RENA-VITE PO TABS
1.0000 | ORAL_TABLET | Freq: Every day | ORAL | Status: DC
  Administered 2019-12-22 – 2019-12-23 (×2): 1 via ORAL
  Filled 2019-12-22 (×2): qty 1

## 2019-12-22 MED ORDER — HYDROMORPHONE HCL 1 MG/ML IJ SOLN
0.5000 mg | INTRAMUSCULAR | Status: DC | PRN
  Administered 2019-12-22 – 2019-12-24 (×10): 0.5 mg via INTRAVENOUS
  Filled 2019-12-22 (×10): qty 1

## 2019-12-22 MED ORDER — LIDOCAINE HCL (PF) 1 % IJ SOLN
INTRAMUSCULAR | Status: AC
  Filled 2019-12-22: qty 30

## 2019-12-22 MED ORDER — ALBUMIN HUMAN 25 % IV SOLN: 25.0000 g | Freq: Once | INTRAVENOUS | Status: AC

## 2019-12-22 MED ORDER — SODIUM ZIRCONIUM CYCLOSILICATE 10 G PO PACK
10.0000 g | PACK | Freq: Once | ORAL | Status: AC
  Administered 2019-12-22: 10 g via ORAL
  Filled 2019-12-22: qty 1

## 2019-12-22 MED ORDER — LIDOCAINE-PRILOCAINE 2.5-2.5 % EX CREA
1.0000 | TOPICAL_CREAM | CUTANEOUS | Status: DC | PRN
Start: 2019-12-22 — End: 2019-12-22

## 2019-12-22 MED ORDER — CALCITRIOL 0.5 MCG PO CAPS: 2.0000 ug | ORAL_CAPSULE | ORAL | Status: DC

## 2019-12-22 MED ORDER — ONDANSETRON HCL 4 MG/2ML IJ SOLN
4.0000 mg | Freq: Once | INTRAMUSCULAR | Status: AC
  Administered 2019-12-22: 4 mg via INTRAVENOUS
  Filled 2019-12-22: qty 2

## 2019-12-22 MED ORDER — SODIUM CHLORIDE 0.9 % IV SOLN
2.0000 g | Freq: Once | INTRAVENOUS | Status: AC
  Administered 2019-12-22: 2 g via INTRAVENOUS
  Filled 2019-12-22: qty 20

## 2019-12-22 MED ORDER — NEPRO/CARBSTEADY PO LIQD
237.0000 mL | Freq: Two times a day (BID) | ORAL | Status: DC
  Administered 2019-12-22 – 2019-12-23 (×4): 237 mL via ORAL

## 2019-12-22 MED ORDER — MORPHINE SULFATE (PF) 2 MG/ML IV SOLN
1.0000 mg | Freq: Once | INTRAVENOUS | Status: DC
  Filled 2019-12-22: qty 1

## 2019-12-22 MED ORDER — PRO-STAT SUGAR FREE PO LIQD
30.0000 mL | Freq: Two times a day (BID) | ORAL | Status: DC
  Administered 2019-12-22 – 2019-12-23 (×2): 30 mL via ORAL
  Filled 2019-12-22 (×5): qty 30

## 2019-12-22 MED ORDER — ALBUMIN HUMAN 25 % IV SOLN
INTRAVENOUS | Status: AC
  Administered 2019-12-22: 25 g via INTRAVENOUS
  Filled 2019-12-22: qty 100

## 2019-12-22 NOTE — Procedures (Signed)
Ultrasound-guided diagnostic and therapeutic paracentesis performed yielding 4.6 liters of serosanguinous colored fluid.  Fluid was sent to lab for analysis. No immediate complications. EBL is < 2 ml.

## 2019-12-22 NOTE — Consult Note (Signed)
[name]  INPATIENT CONSULTATION  Reason for Consultation: ESRD  Requesting Provider: Dr. Vanita Panda  HPI: [name] is an 59 y.o. male with ESRD on HD at [facility] MWF, HTN, HCV with cirrhosis, recurrent ascites, COPD, PAF not on anticoagulation, chronic pain, medical noncompliance with truncated HD treatments, polysubstance abuse who is seen for evaluation and management of ESRD and related conditions.  He is admitted to the hospital with abdominal pain and suspected SBP.  He presented to the ED x 2 yesterday with abdominal pain and thus missed HD.  The 2nd time he was admitted.  CT renal stone study with ascites, no acute findings. CXR with congestion.  Labs with K 5.8, BUN 50, Cr 6.4, WBC 11.4 Hb 9.7.   He underwent LVP 4L this AM in IR.  He says fluid was murky red; fluid analysis pending.  His abdomen feels some better but not remarkably so.  Some mild dyspnea on 2L now.  No other complaints except tired.   PMH: Past Medical History:  Diagnosis Date  . Anemia   . Anxiety   . Arthritis    left shoulder  . Atherosclerosis of aorta ([place])   . Cardiomegaly   . Chest pain    DATE UNKNOWN, C/O PERIODICALLY  . Cocaine abuse ([place])   . COPD exacerbation ([place]) 08/17/2016  . Coronary artery disease    stent 02/22/17  . ESRD (end stage renal disease) on dialysis ([place])    "[name]; MWF" (07/04/2017)  . GERD (gastroesophageal reflux disease)    DATE UNKNOWN  . Hemorrhoids   . Hepatitis B, chronic ([place])   . Hepatitis C   . History of kidney stones   . Hyperkalemia   . Hypertension   . Kidney failure   . Metabolic bone disease    Patient denies  . Mitral stenosis   . Myocardial infarction ([place])   . Pneumonia   . Pulmonary edema   . Solitary rectal ulcer syndrome 07/2017   at flex sig for rectal bleeding  . Tubular adenoma of colon    PSH: Past Surgical History:  Procedure Laterality Date  . A/V FISTULAGRAM Left 05/26/2017   Procedure: A/V FISTULAGRAM;   Surgeon: Conrad Arpelar, MD;  Location: [place] [facility];  Service: Cardiovascular;  Laterality: Left;  . A/V FISTULAGRAM Right 11/18/2017   Procedure: A/V FISTULAGRAM - Right Arm;  Surgeon: Elam Dutch, MD;  Location: [place] [facility];  Service: Cardiovascular;  Laterality: Right;  . APPLICATION OF WOUND VAC Left 06/14/2017   Procedure: APPLICATION OF WOUND VAC;  Surgeon: Katha Cabal, MD;  Location: [facility] ORS;  Service: Vascular;  Laterality: Left;  . AV FISTULA PLACEMENT  2012   BELIEVED WAS PLACED IN JUNE  . AV FISTULA PLACEMENT Right 08/09/2017   Procedure: Creation Right arm ARTERIOVENOUS BRACHIOCEPOHALIC FISTULA;  Surgeon: Elam Dutch, MD;  Location[facility];  Service: Vascular;  Laterality: Right;  . AV FISTULA PLACEMENT Right 11/22/2017   Procedure: INSERTION OF ARTERIOVENOUS (AV) GORE-TEX GRAFT RIGHT UPPER ARM;  Surgeon: Elam Dutch, MD;  Location: [place];  Service: Vascular;  Laterality: Right;  . BIOPSY  01/25/2018   Procedure: BIOPSY;  Surgeon: Jerene Bears, MD;  Location: [place];  Service: Gastroenterology;;  . BIOPSY  04/10/2019   Procedure: BIOPSY;  Surgeon: Jerene Bears, MD;  Location: [facility];  Service: Gastroenterology;;  . COLONOSCOPY    . COLONOSCOPY WITH PROPOFOL N/A 01/25/2018   Procedure: COLONOSCOPY  WITH PROPOFOL;  Surgeon: Jerene Bears, MD;  Location: [facility] ENDOSCOPY;  Service: Gastroenterology;  Laterality: N/A;  . CORONARY STENT INTERVENTION N/A 02/22/2017   Procedure: CORONARY STENT INTERVENTION;  Surgeon: Nigel Mormon, MD;  Location: [place] [facility];  Service: Cardiovascular;  Laterality: N/A;  . ESOPHAGOGASTRODUODENOSCOPY (EGD) WITH PROPOFOL N/A 01/25/2018   Procedure: ESOPHAGOGASTRODUODENOSCOPY (EGD) WITH PROPOFOL;  Surgeon: Jerene Bears, MD;  Location: [place];  Service: Gastroenterology;  Laterality: N/A;  . ESOPHAGOGASTRODUODENOSCOPY (EGD) WITH PROPOFOL N/A 04/10/2019   Procedure: ESOPHAGOGASTRODUODENOSCOPY (EGD) WITH  PROPOFOL;  Surgeon: Jerene Bears, MD;  [facility];  Service: Gastroenterology;  Laterality: N/A;  . FLEXIBLE SIGMOIDOSCOPY N/A 07/15/2017   Procedure: FLEXIBLE SIGMOIDOSCOPY;  Surgeon: Carol Ada, MD;  Location: [place];  Service: Endoscopy;  Laterality: N/A;  . HEMORRHOID BANDING    . I & D EXTREMITY Left 06/01/2017   Procedure: IRRIGATION AND DEBRIDEMENT LEFT ARM HEMATOMA WITH LIGATION OF LEFT ARM AV FISTULA;  Surgeon: Elam Dutch, MD;  Location: [place];  Service: Vascular;  Laterality: Left;  . I & D EXTREMITY Left 06/14/2017   Procedure: IRRIGATION AND DEBRIDEMENT EXTREMITY;  Surgeon: Katha Cabal, MD;  Location: [facility] OR[facility];  Service: Vascular;  Laterality: Left;  . INSERTION OF DIALYSIS CATHETER  05/30/2017  . INSERTION OF DIALYSIS CATHETER N/A 05/30/2017   Procedure: INSERTION OF DIALYSIS CATHETER;  Surgeon: Elam Dutch, MD;  Location: [place];  Service: Vascular;  Laterality: N/A;  . IR PARACENTESIS  08/30/2017  . IR PARACENTESIS  09/29/2017  . IR PARACENTESIS  10/28/2017  . IR PARACENTESIS  11/09/2017  . IR PARACENTESIS  11/16/2017  . IR PARACENTESIS  11/28/2017  . IR PARACENTESIS  12/01/2017  . IR PARACENTESIS  12/06/2017  . IR PARACENTESIS  01/03/2018  . IR PARACENTESIS  01/23/2018  . IR PARACENTESIS  02/07/2018  . IR PARACENTESIS  02/21/2018  . IR PARACENTESIS  03/06/2018  . IR PARACENTESIS  03/17/2018  . IR PARACENTESIS  04/04/2018  . IR PARACENTESIS  12/28/2018  . IR PARACENTESIS  01/08/2019  . IR PARACENTESIS  01/23/2019  . IR PARACENTESIS  02/01/2019  . IR PARACENTESIS  02/19/2019  . IR PARACENTESIS  03/01/2019  . IR PARACENTESIS  03/15/2019  . IR PARACENTESIS  04/03/2019  . IR PARACENTESIS  04/12/2019  . IR PARACENTESIS  05/01/2019  . IR PARACENTESIS  05/08/2019  . IR PARACENTESIS  05/24/2019  . IR PARACENTESIS  06/12/2019  . IR PARACENTESIS  07/09/2019  . IR PARACENTESIS  07/27/2019  . IR PARACENTESIS  08/09/2019  . IR PARACENTESIS  08/21/2019  . IR  PARACENTESIS  09/17/2019  . IR PARACENTESIS  10/05/2019  . IR PARACENTESIS  10/29/2019  . IR PARACENTESIS  11/08/2019  . IR PARACENTESIS  12/12/2019  . IR RADIOLOGIST EVAL & MGMT  02/14/2018  . IR RADIOLOGIST EVAL & MGMT  02/22/2019  . LEFT HEART CATH AND CORONARY ANGIOGRAPHY N/A 02/22/2017   Procedure: LEFT HEART CATH AND CORONARY ANGIOGRAPHY;  Surgeon: Nigel Mormon, MD;  Location: [place] [facility];  Service: Cardiovascular;  Laterality: N/A;  . LIGATION OF ARTERIOVENOUS  FISTULA Left 08/17/35   Procedure: Plication of Left Arm Arteriovenous Fistula;  Surgeon: Elam Dutch, MD;  Location: [place];  Service: Vascular;  Laterality: Left;  . POLYPECTOMY    . POLYPECTOMY  01/25/2018   Procedure: POLYPECTOMY;  Surgeon: Jerene Bears, MD;  Location: [place];  Service: Gastroenterology;;  . REVISON OF ARTERIOVENOUS FISTULA Left  2/83/6629   Procedure: PLICATION OF DISTAL ANEURYSMAL SEGEMENT OF LEFT UPPER ARM ARTERIOVENOUS FISTULA;  Surgeon: Elam Dutch, MD;  Location: [place];  Service: Vascular;  Laterality: Left;  . REVISON OF ARTERIOVENOUS FISTULA Left 4/76/5465   Procedure: Plication of Left Upper Arm Fistula ;  Surgeon: Waynetta Sandy, MD;  Location: [place];  Service: Vascular;  Laterality: Left;  . SKIN GRAFT SPLIT THICKNESS LEG / FOOT Left    SKIN GRAFT SPLIT THICKNESS LEFT ARM DONOR SITE: LEFT ANTERIOR THIGH  . SKIN SPLIT GRAFT Left 07/04/2017   Procedure: SKIN GRAFT SPLIT THICKNESS LEFT ARM DONOR SITE: LEFT ANTERIOR THIGH;  Surgeon: Elam Dutch, MD;  Location: [place];  Service: Vascular;  Laterality: Left;  . THROMBECTOMY W/ EMBOLECTOMY Left 06/05/2017   Procedure: EXPLORATION OF LEFT ARM FOR BLEEDING; OVERSEWED PROXIMAL FISTULA;  Surgeon: Angelia Mould, MD;  Location: [place];  Service: Vascular;  Laterality: Left;  . WOUND EXPLORATION Left 06/03/2017   Procedure: WOUND EXPLORATION WITH WOUND VAC APPLICATION TO LEFT ARM;  Surgeon: Angelia Mould, MD;   Location: [place];  Service: Vascular;  Laterality: Left;     Past Medical History:  Diagnosis Date  . Anemia   . Anxiety   . Arthritis    left shoulder  . Atherosclerosis of aorta ([place])   . Cardiomegaly   . Chest pain    DATE UNKNOWN, C/O PERIODICALLY  . Cocaine abuse ([place])   . COPD exacerbation ([place]) 08/17/2016  . Coronary artery disease    stent 02/22/17  . ESRD (end stage renal disease) on dialysis ([place])    "[name]; MWF" (07/04/2017)  . GERD (gastroesophageal reflux disease)    DATE UNKNOWN  . Hemorrhoids   . Hepatitis B, chronic ([place])   . Hepatitis C   . History of kidney stones   . Hyperkalemia   . Hypertension   . Kidney failure   . Metabolic bone disease    Patient denies  . Mitral stenosis   . Myocardial infarction ([place])   . Pneumonia   . Pulmonary edema   . Solitary rectal ulcer syndrome 07/2017   at flex sig for rectal bleeding  . Tubular adenoma of colon     Medications:  I have reviewed the patient's current medications.  Medications Prior to Admission  Medication Sig Dispense Refill  . amiodarone (PACERONE) 200 MG tablet Take 1 tablet (200 mg total) by mouth 2 (two) times daily. 60 tablet 0  . diltiazem (CARDIZEM CD) 240 MG 24 hr capsule TAKE 1 CAPSULE BY MOUTH EVERY DAY (Patient taking differently: Take 240 mg by mouth daily. ) 90 capsule 0  . gabapentin (NEURONTIN) 100 MG capsule Take 1 capsule (100 mg total) by mouth 3 (three) times daily for 20 doses. 20 capsule 0  . hydrALAZINE (APRESOLINE) 100 MG tablet Take 100 mg by mouth 2 (two) times daily.    . hydrOXYzine (ATARAX/VISTARIL) 25 MG tablet Take 25 mg by mouth 2 (two) times daily as needed for itching.     . lactulose (CHRONULAC) 10 GM/15ML solution Take 45 mLs (30 g total) by mouth daily as needed for mild constipation. 236 mL 0  . lidocaine (LIDODERM) 5 % Place 1 patch onto the skin daily. Remove & Discard patch within 12 hours or as directed by MD 30 patch 0  . lidocaine (LIDODERM) 5 % Place  1 patch onto the skin daily for 10 doses. Remove & Discard patch within 12 hours or as  directed by MD 10 patch 0  . sevelamer carbonate (RENVELA) 800 MG tablet Take 1,600-3,200 mg by mouth See admin instructions. Take 4 tablets (3200 mg) by mouth 3 times daily with meals and 2 tablets (1600 mg) with snacks    . metoprolol tartrate (LOPRESSOR) 100 MG tablet Take 1 tablet (100 mg total) by mouth 2 (two) times daily. (Patient not taking: Reported on 12/21/2019) 60 tablet 0  . ondansetron (ZOFRAN) 4 MG tablet Take 4 mg by mouth every 4 (four) hours as needed for nausea or vomiting. (Patient not taking: Reported on 12/21/2019)    . pantoprazole (PROTONIX) 40 MG tablet Take 40 mg by mouth daily. (Patient not taking: Reported on 12/21/2019)      ALLERGIES:   Allergies  Allergen Reactions  . Tramadol Itching and Other (See Comments)  . Morphine And Related Other (See Comments)    Stomach pain  . Pollen Extract     Other reaction(s): Sneezing (finding)  . Acetaminophen Nausea Only    Stomach ache Other reaction(s): Other (See Comments) Stomach ache  . Aspirin Other (See Comments) and Itching    STOMACH PAIN Other reaction(s): Unknown STOMACH PAIN  . Clonidine Derivatives Itching    FAM HX: Family History  Problem Relation Age of Onset  . Heart disease Mother   . Lung cancer Mother   . Heart disease Father   . Malignant hyperthermia Father   . COPD Father   . Throat cancer Sister   . Esophageal cancer Sister   . Hypertension Other   . COPD Other   . Colon cancer Neg Hx   . Colon polyps Neg Hx   . Rectal cancer Neg Hx   . Stomach cancer Neg Hx     Social History:   reports that he has been smoking cigarettes. He started smoking about 46 years ago. He has a 21.50 pack-year smoking history. He has never used smokeless tobacco. He reports previous alcohol use. He reports previous drug use. Drugs: Marijuana and Cocaine.  ROS: 12 system ROS per HPI  Blood pressure 101/70, pulse (!)  115, temperature 98.6 F (37 C), temperature source Oral, resp. rate 15, height 5\' 9"  (1.753 m), weight 63.7 kg, SpO2 94 %. PHYSICAL EXAM: Gen: chronically ill and frail appearing lying on L side in bed  Eyes: anicteric, EOMI ENT: MMM CV:  Tachycardic, regular, no rub Abd: soft, mildly TTP Lungs: dec air movement esp in R base, some exp wheezes Extr: thin, minor edema, RUE with patent AV access with T/B Neuro: conversan t Skin: warm and dry   Results for orders placed or performed during the hospital encounter of 12/21/19 (from the past 48 hour(s))  CBC with Differential     Status: Abnormal   Collection Time: 12/21/19  6:37 PM  Result Value Ref Range   WBC 10.2 4.0 - 10.5 K/uL   RBC 3.62 (L) 4.22 - 5.81 MIL/uL   Hemoglobin 10.2 (L) 13.0 - 17.0 g/dL   HCT 30.4 (L) 39 - 52 %   MCV 84.0 80.0 - 100.0 fL   MCH 28.2 26.0 - 34.0 pg   MCHC 33.6 30.0 - 36.0 g/dL   RDW 17.4 (H) 11.5 - 15.5 %   Platelets 278 150 - 400 K/uL   nRBC 0.3 (H) 0.0 - 0.2 %   Neutrophils Relative % 78 %   Neutro Abs 7.9 (H) 1.7 - 7.7 K/uL   Lymphocytes Relative 14 %   Lymphs Abs 1.4 0.7 - 4.0  K/uL   Monocytes Relative 6 %   Monocytes Absolute 0.6 0 - 1 K/uL   Eosinophils Relative 1 %   Eosinophils Absolute 0.1 0 - 0 K/uL   Basophils Relative 1 %   Basophils Absolute 0.1 0 - 0 K/uL   Immature Granulocytes 0 %   Abs Immature Granulocytes 0.04 0.00 - 0.07 K/uL    Comment: Performed at [facility] [address] [place], [place] [address]  Comprehensive metabolic panel     Status: Abnormal   Collection Time: 12/21/19  7:27 PM  Result Value Ref Range   Sodium 132 (L) 135 - 145 mmol/L   Potassium 5.8 (H) 3.5 - 5.1 mmol/L   Chloride 92 (L) 98 - 111 mmol/L   CO2 24 22 - 32 mmol/L   Glucose, Bld 77 70 - 99 mg/dL    Comment: Glucose reference range applies only to samples taken after fasting for at least 8 hours.   BUN 50 (H) 6 - 20 mg/dL   Creatinine, Ser 6.38 (H) 0.61 - 1.24 mg/dL   Calcium 9.6 8.9  - 10.3 mg/dL   Total Protein 7.1 6.5 - 8.1 g/dL   Albumin 2.8 (L) 3.5 - 5.0 g/dL   AST 39 15 - 41 U/L   ALT 28 0 - 44 U/L   Alkaline Phosphatase 271 (H) 38 - 126 U/L   Total Bilirubin 1.0 0.3 - 1.2 mg/dL   GFR calc non Af Amer 9 (L) >60 mL/min   GFR calc Af Amer 10 (L) >60 mL/min   Anion gap 16 (H) 5 - 15    Comment: Performed at [facility] [address], [place], [place] [address]  SARS Coronavirus 2 by RT PCR (hospital order, performed in [facility] hospital lab) Nasopharyngeal Nasopharyngeal Swab     Status: None   Collection Time: 12/21/19  9:22 PM   Specimen: Nasopharyngeal Swab  Result Value Ref Range   SARS Coronavirus 2 NEGATIVE NEGATIVE    Comment: (NOTE) SARS-CoV-2 target nucleic acids are NOT DETECTED.  The SARS-CoV-2 RNA is generally detectable in upper and lower respiratory specimens during the acute phase of infection. The lowest concentration of SARS-CoV-2 viral copies this assay can detect is 250 copies / mL. A negative result does not preclude SARS-CoV-2 infection and should not be used as the sole basis for treatment or other patient management decisions.  A negative result may occur with improper specimen collection / handling, submission of specimen other than nasopharyngeal swab, presence of viral mutation(s) within the areas targeted by this assay, and inadequate number of viral copies (<250 copies / mL). A negative result must be combined with clinical observations, patient history, and epidemiological information.  Fact Sheet for Patients:   StrictlyIdeas.no  Fact Sheet for Healthcare Providers: BankingDealers.co.za  This test is not yet approved or  cleared by the [place] FDA and has been authorized for detection and/or diagnosis of SARS-CoV-2 by FDA under an Emergency Use Authorization (EUA).  This EUA will remain in effect (meaning this test can be used) for the duration of the COVID-19  declaration under Section 564(b)(1) of the Act, 21 U.S.C. section 360bbb-3(b)(1), unless the authorization is terminated or revoked sooner.  Performed at [facility], [place] [address] [place], [place] [address]   Basic metabolic panel     Status: Abnormal   Collection Time: 12/22/19  4:48 AM  Result Value Ref Range   Sodium 131 (L) 135 - 145  mmol/L   Potassium 6.2 (H) 3.5 - 5.1 mmol/L   Chloride 89 (L) 98 - 111 mmol/L   CO2 21 (L) 22 - 32 mmol/L   Glucose, Bld 55 (L) 70 - 99 mg/dL    Comment: Glucose reference range applies only to samples taken after fasting for at least 8 hours.   BUN 55 (H) 6 - 20 mg/dL   Creatinine, Ser 6.70 (H) 0.61 - 1.24 mg/dL   Calcium 9.6 8.9 - 10.3 mg/dL   GFR calc non Af Amer 8 (L) >60 mL/min   GFR calc Af Amer 10 (L) >60 mL/min   Anion gap 21 (H) 5 - 15    Comment: Performed at [facility] [address], [place], [place] [address]  CBC     Status: Abnormal   Collection Time: 12/22/19  4:48 AM  Result Value Ref Range   WBC 11.4 (H) 4.0 - 10.5 K/uL   RBC 3.45 (L) 4.22 - 5.81 MIL/uL   Hemoglobin 9.7 (L) 13.0 - 17.0 g/dL   HCT 29.2 (L) 39 - 52 %   MCV 84.6 80.0 - 100.0 fL   MCH 28.1 26.0 - 34.0 pg   MCHC 33.2 30.0 - 36.0 g/dL   RDW 16.8 (H) 11.5 - 15.5 %   Platelets 234 150 - 400 K/uL   nRBC 0.4 (H) 0.0 - 0.2 %    Comment: Performed at [facility] [address], [place], [place] [address]  Basic metabolic panel     Status: Abnormal   Collection Time: 12/22/19  8:00 AM  Result Value Ref Range   Sodium 132 (L) 135 - 145 mmol/L   Potassium 5.7 (H) 3.5 - 5.1 mmol/L   Chloride 88 (L) 98 - 111 mmol/L   CO2 22 22 - 32 mmol/L   Glucose, Bld 46 (L) 70 - 99 mg/dL    Comment: Glucose reference range applies only to samples taken after fasting for at least 8 hours.   BUN 56 (H) 6 - 20 mg/dL   Creatinine, Ser 6.92 (H) 0.61 - 1.24 mg/dL   Calcium 9.5 8.9 - 10.3 mg/dL   GFR calc non Af Amer 8 (L) >60 mL/min   GFR calc Af Amer 9 (L)  >60 mL/min   Anion gap 22 (H) 5 - 15    Comment: Performed at [facility] [address], [place], [place] [address]  CBC with Differential/Platelet     Status: Abnormal   Collection Time: 12/22/19  8:00 AM  Result Value Ref Range   WBC 12.7 (H) 4.0 - 10.5 K/uL   RBC 3.52 (L) 4.22 - 5.81 MIL/uL   Hemoglobin 10.0 (L) 13.0 - 17.0 g/dL   HCT 29.6 (L) 39 - 52 %   MCV 84.1 80.0 - 100.0 fL   MCH 28.4 26.0 - 34.0 pg   MCHC 33.8 30.0 - 36.0 g/dL   RDW 16.6 (H) 11.5 - 15.5 %   Platelets 281 150 - 400 K/uL   nRBC 0.6 (H) 0.0 - 0.2 %   Neutrophils Relative % 79 %   Neutro Abs 10.2 (H) 1.7 - 7.7 K/uL   Lymphocytes Relative 10 %   Lymphs Abs 1.2 0.7 - 4.0 K/uL   Monocytes Relative 8 %   Monocytes Absolute 1.0 0 - 1 K/uL   Eosinophils Relative 1 %   Eosinophils Absolute 0.1 0 - 0 K/uL   Basophils Relative 1 %   Basophils Absolute 0.1 0 -  0 K/uL   Immature Granulocytes 1 %   Abs Immature Granulocytes 0.08 (H) 0.00 - 0.07 K/uL    Comment: Performed at [facility], [place] [address] [place], [place] [address]  Albumin, pleural or peritoneal fluid     Status: None   Collection Time: 12/22/19 10:06 AM  Result Value Ref Range   Albumin, Fluid 1.7 g/dL    Comment: (NOTE) No normal range established for this test Results should be evaluated in conjunction with serum values    Fluid Type-FALB CYTO PERI     Comment: Performed at [facility] [address], [place], [place] [address]  Protein, pleural or peritoneal fluid     Status: None   Collection Time: 12/22/19 10:06 AM  Result Value Ref Range   Total protein, fluid 4.1 g/dL    Comment: (NOTE) No normal range established for this test Results should be evaluated in conjunction with serum values    Fluid Type-FTP CYTO PERI     Comment: Performed at [facility] [address], [place], [place] [address]  Glucose, capillary     Status: Abnormal   Collection Time: 12/22/19 10:47 AM  Result Value Ref Range    Glucose-Capillary 41 (LL) 70 - 99 mg/dL    Comment: Glucose reference range applies only to samples taken after fasting for at least 8 hours.   Comment 1 Notify RN    Comment 2 Document in Chart     US Paracentesis  Result Date: 12/22/2019 INDICATION: Patient with history of cirrhosis with recurrent ascites presents for diagnostic and therapeutic paracentesis EXAM: ULTRASOUND GUIDED DIAGNOSTIC AND THERAPEUTIC PARACENTESIS MEDICATIONS: Lidocaine 1% 10 mL COMPLICATIONS: None immediate. PROCEDURE: Informed written consent was obtained from the patient after a discussion of the risks, benefits and alternatives to treatment. A timeout was performed prior to the initiation of the procedure. Initial ultrasound scanning demonstrates a moderate amount of ascites within the right lower abdominal quadrant. The right lower abdomen was prepped and draped in the usual sterile fashion. 1% lidocaine was used for local anesthesia. Following this, a 19 gauge, 7-cm, Yueh catheter was introduced. An ultrasound image was saved for documentation purposes. The paracentesis was performed. The catheter was removed and a dressing was applied. The patient tolerated the procedure well without immediate post procedural complication. FINDINGS: A total of approximately 4.6 L of serosanguineous fluid was removed. Samples were sent to the laboratory as requested by the clinical team. IMPRESSION: Successful ultrasound-guided diagnostic and therapeutic paracentesis yielding 4.6 liters of peritoneal fluid. Read by: Rushie Nyhan, NP Electronically Signed   By: Jerilynn Mages.  Shick M.D.   On: 12/22/2019 10:28   DG Chest Port 1 View  Result Date: 12/21/2019 CLINICAL DATA:  Shortness of breath.  End-stage renal disease EXAM: PORTABLE CHEST 1 VIEW COMPARISON:  December 18, 2019 FINDINGS: There is ill-defined opacity in portions of each mid and lower lung region, similar to recent study, which is felt to represent a degree of interstitial edema. No  consolidation. There is cardiomegaly with a degree of pulmonary venous hypertension. No adenopathy. There is aortic atherosclerosis. There is calcification in each carotid artery an each subclavian artery. No adenopathy. No bone lesion. IMPRESSION: Apparent interstitial edema in the mid and lower lung regions, similar to recent study. There is cardiomegaly with a degree of pulmonary vascular congestion. Suspect congestive heart failure. No consolidation evident. Extensive atherosclerotic calcification at multiple sites as noted above. Aortic Atherosclerosis (ICD10-I70.0). Electronically Signed   By:  Lowella Grip III M.D.   On: 12/21/2019 19:06   CT RENAL STONE STUDY  Result Date: 12/22/2019 CLINICAL DATA:  Flank pain EXAM: CT ABDOMEN AND PELVIS WITHOUT CONTRAST TECHNIQUE: Multidetector CT imaging of the abdomen and pelvis was performed following the standard protocol without IV contrast. COMPARISON:  05/07/2019 FINDINGS: Lower chest: There are small bilateral pleural effusions.The heart size is significantly enlarged. There is a small to moderate-sized pericardial effusion. Advanced coronary artery calcifications are noted. There is some atelectasis at the lung bases. Hepatobiliary: The liver is normal. Normal gallbladder.There is no biliary ductal dilation. Pancreas: Normal contours without ductal dilatation. No peripancreatic fluid collection. Spleen: Unremarkable. Adrenals/Urinary Tract: --Adrenal glands: Unremarkable. --Right kidney/ureter: The right kidney is atrophic. --Left kidney/ureter: Left kidney is atrophic. --Urinary bladder: Bladder is decompressed and therefore poorly evaluated. Stomach/Bowel: --Stomach/Duodenum: No hiatal hernia or other gastric abnormality. Normal duodenal course and caliber. --Small bowel: Unremarkable. --Colon: Unremarkable. --Appendix: Normal. Vascular/Lymphatic: Atherosclerotic calcification is present within the non-aneurysmal abdominal aorta, without hemodynamically  significant stenosis. --No retroperitoneal lymphadenopathy. --No mesenteric lymphadenopathy. --No pelvic or inguinal lymphadenopathy. Reproductive: Unremarkable Other: There is a large volume of abdominal ascites. There is body wall edema. Musculoskeletal. Renal osteodystrophy is noted. IMPRESSION: 1. Large volume of abdominal ascites. 2. Small bilateral pleural effusions and small to moderate-sized pericardial effusion. 3. Cardiomegaly with advanced coronary artery calcifications. 4. Atrophic kidneys. Aortic Atherosclerosis (ICD10-I70.0). Electronically Signed   By: Constance Holster M.D.   On: 12/22/2019 00:29   Dialysis Orders:   [place] MWF1 3x/wk 180 NR BFR 450 DFR 800 EDW 62  4hrs 2k/2Ca AVG mircera 150 q2wks last dose 6/4, venofer 50 weekly Calcitriol 2 TIW 6/9 Hb 9.2; 6/4 Phos 5.4, Ca 9.4; 10/2019 PTH 434 renvela 4 TIDAC 2 with snacks Last HD 6/9 3 hrs, post wt 63kg  Assessment/Plan **SIRS criteria: suspected sepsis from SBP; antibiotics on board per primary, fluid analysis pending.  **ESRD on HD:  Plan HD later today then resume outpt MWF schedule. 4hrs AVG UF to EDW 62kg  **Anemia of CKD: Hb 10, just rec'd ESA outpt, CTM  **Secondary hyperparathyroidism:  Cont outpt calcitriol and binders; PTH and phos at goal at HD.   **Nutrition: renal MVI, prostat  **Cirrhosis:  Recurrent ascites requiring LVPs  **A fib:  Appears in ST currently; on nodal blocking agents. Not on anticoag.  Justin Mend 12/22/2019, 11:22 AM

## 2019-12-22 NOTE — Progress Notes (Signed)
Initial Nutrition Assessment  DOCUMENTATION CODES:   Not applicable  INTERVENTION:    Nepro Shake po BID, each supplement provides 425 kcal and 19 grams protein  30 ml Prostat BID, each supplement provides 100 kcals and 15 grams protein.   Renal MVI   NUTRITION DIAGNOSIS:   Increased nutrient needs related to wound healing as evidenced by estimated needs.  GOAL:   Patient will meet greater than or equal to 90% of their needs  MONITOR:   PO intake, Supplement acceptance, Weight trends, I & O's, Labs, Skin  REASON FOR ASSESSMENT:   Malnutrition Screening Tool    ASSESSMENT:   Patient with PMH significant for ESRD on HD, CAD, HTN, COPD, HVC, ETOH abuse cirrhosis of liver with previous history of SBP, and recurrent ascites. Presents this admission with pulmonary edema 2/2 to missed HD treatments and suspected SBP.   6/12- s/p paracentesis-4.6 L drained   RD working remotely.  Plan HD later today. Unable to reach pt by phone. Will need to obtain nutrition/weight history if possible. No meal completions charted this admission. Per MAR, pt has taken Nepro shakes twice today and prostat once. Continue supplements to maximize kcal and protein this admission.   EDW charted as 62 kg.  Weight over the last year fluctuates from 61-68 kg over the last year. Show a sight trend downwards in weight over the last couple of months. Unable to determine exact quantity of dry weight loss given history of ESRD/HD. Pt had previous diagnosis of PCM in the past. Will need to obtain NFPE this admission.   Medications: calcitriol, rena-vit, renvela Labs: Na 132 (L) K 5.7 (H) CBG 44-91  Diet Order:   Diet Order            Diet renal with fluid restriction Fluid restriction: 1200 mL Fluid; Room service appropriate? Yes; Fluid consistency: Thin  Diet effective now                 EDUCATION NEEDS:   Not appropriate for education at this time  Skin:  Skin Assessment: Skin Integrity  Issues: Skin Integrity Issues:: Stage II Stage II: coccyx  Last BM:  6/11  Height:   Ht Readings from Last 1 Encounters:  12/22/19 5\' 9"  (1.753 m)    Weight:   Wt Readings from Last 1 Encounters:  12/22/19 63.7 kg    BMI:  Body mass index is 20.75 kg/m.  Estimated Nutritional Needs:   Kcal:  2100-2300 kcal  Protein:  115-130 grams  Fluid:  1000 ml + UOP   Mariana Single RD, LDN Clinical Nutrition Pager listed in [place]

## 2019-12-22 NOTE — Progress Notes (Signed)
PROGRESS NOTE  [name] VQM:[id number] DOB: June 24, 1961 DOA: 12/21/2019 PCP: [name], FNP   LOS: 0 days   Brief Narrative / Interim history: 59 year old male with history of ESRD on HD MWF, paroxysmal A. fib, CAD, HTN, liver cirrhosis with prior history of SBP came into the hospital due to increased abdominal pain, he has been having this for several weeks but has recently worsened.  He reported some nausea and vomiting, denies any blood in the emesis.  No diarrhea.  He denies any fever or chills.  He came to the ER 24 hours ago, was treated symptomatically and discharged home, and he missed dialysis.  He has been having intermittent shortness of breath since missing dialysis.  No chest pain.  Subjective / 24h Interval events: He is feeling uncomfortable this morning he tells me his abdomen is hurting and would like to have the paracentesis done before the dialysis because it so uncomfortable.  He denies any chest pain  Assessment & Plan: Principal Problem Acute hypoxic respiratory failure due to acute pulmonary edema -this is likely in the setting of the fact that he missed dialysis yesterday being seen in the emergency room.  Nephrology consulted, he will undergo dialysis today. 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  Patient has abdominal pain, has history of SBP and there is concerned that he has recurrent bacterial peritonitis.  He does have SIRS criteria with tachycardia, elevated WBC.  He has been placed empirically on ceftriaxone, ultrasound-guided paracentesis pending.  Send Gram stain, cultures. -Due to tachycardia I will hold his hydralazine  Severe pulmonary hypertension and right-sided heart failure -his most recent 2D echo done in March 2021 showed low normal LV  systolic function, severe LVH, mild to moderate AS, mild AI, mild to moderate MS, severe biatrial enlargement, severe RV dysfunction and severe TR with severe pulmonary hypertension. It was felt that this may actually contribute more to his ascites than his liver disease  ESRD-nephrology consulted, dialysis today  Hyperkalemia-repeat Lokelma, to be dialyzed  History of paroxysmal atrial fibrillation, pre-existing LBBB-on amiodarone, metoprolol, not on anticoagulation.  EKG reviewed, he appears to be sinus tachycardia with intraventricular conduction delay.  His tachycardia can be multifactorial due to hypoxia and shortness of breath as well as potential SBP.  Closely monitor heart rates after antibiotics, paracentesis, dialysis.  Continue beta-blockers.  Hold hydralazine due to tachycardia  History of coronary artery disease-he denies any chest pain  Anemia in the setting of ESRD-chronic, follow CBC  Underlying COPD-no wheezing   Scheduled Meds: . amiodarone  200 mg Oral BID  . Chlorhexidine Gluconate Cloth  6 each Topical Q0600  . diltiazem  240 mg Oral Daily  . feeding supplement (NEPRO CARB STEADY)  237 mL Oral BID BM  . gabapentin  100 mg Oral TID  . lidocaine  1 patch Transdermal Q24H  . lidocaine (PF)      . metoprolol tartrate  100 mg Oral BID  . pantoprazole  40 mg Oral Daily  . sevelamer carbonate  3,200 mg Oral TID with meals  . sodium zirconium cyclosilicate  10 g Oral Once   Continuous Infusions: . sodium chloride    . sodium chloride    . [START ON 12/23/2019] cefTRIAXone (ROCEPHIN)  IV     PRN Meds:.sodium  chloride, sodium chloride, alteplase, heparin, HYDROmorphone (DILAUDID) injection, lactulose, lidocaine (PF), lidocaine-prilocaine, ondansetron (ZOFRAN) IV, pentafluoroprop-tetrafluoroeth, sevelamer carbonate  DVT prophylaxis: SCDs Code Status: DNR Family Communication: no family at bedside   Status is: Observation  The patient will require care spanning > 2  midnights and should be moved to inpatient because: Persistent hypoxia, tachypnea, concern for sepsis due to spontaneous bacterial peritonitis  Dispo: The patient is from: Home              Anticipated d/c is to: Home              Anticipated d/c date is: 3 days              Patient currently is not medically stable to d/c.  Consultants:  Nephrology   Procedures:  none  Microbiology  none  Antimicrobials: Ceftriaxone 6/11    Objective: Vitals:   12/22/19 0600 12/22/19 0621 12/22/19 0700 12/22/19 0736  BP:  102/79  102/71  Pulse: (!) 125 (!) 127 (!) 124 (!) 122  Resp:    16  Temp:    97.8 F (36.6 C)  TempSrc:    Oral  SpO2: 95% 95% 94% 100%  Weight:      Height:        Intake/Output Summary (Last 24 hours) at 12/22/2019 1024 Last data filed at 12/22/2019 0600 Gross per 24 hour  Intake 100 ml  Output 0 ml  Net 100 ml   Filed Weights   12/22/19 0200  Weight: 63.7 kg    Examination:  Constitutional: Appears uncomfortable, sitting at the edge of the bed, tachypneic.  Cachectic appearing with distended abdomen Eyes: no scleral icterus ENMT: Mucous membranes are moist.  Neck: normal, supple Respiratory: clear to auscultation bilaterally, no wheezing, no crackles. Normal respiratory effort. No accessory muscle use.  Cardiovascular: Regular rate and rhythm, 4/6 holosystolic murmur Abdomen: Distended abdomen, diffusely tender without guarding or rebound Musculoskeletal: no clubbing / cyanosis.  Skin: no rashes Neurologic: Nonfocal  Data Reviewed: I have independently reviewed following labs and imaging studies   CBC: Recent Labs  Lab 12/18/19 2000 12/20/19 2032 12/21/19 1837 12/22/19 0448 12/22/19 0800  WBC 9.7 9.7 10.2 11.4* 12.7*  NEUTROABS  --   --  7.9*  --  10.2*  HGB 9.0* 9.5* 10.2* 9.7* 10.0*  HCT 26.9* 28.3* 30.4* 29.2* 29.6*  MCV 83.3 83.7 84.0 84.6 84.1  PLT 235 235 278 234 419   Basic Metabolic Panel: Recent Labs  Lab 12/18/19 2000  12/18/19 2101 12/20/19 2032 12/21/19 1927 12/22/19 0448 12/22/19 0800  NA 135  --  132* 132* 131* 132*  K 5.6*  --  4.7 5.8* 6.2* 5.7*  CL 87*  --  90* 92* 89* 88*  CO2 28  --  26 24 21* 22  GLUCOSE 95  --  86 77 55* 46*  BUN 45*  --  36* 50* 55* 56*  CREATININE 5.97*  --  5.31* 6.38* 6.70* 6.92*  CALCIUM 10.0  --  9.9 9.6 9.6 9.5  MG  --  2.2  --   --   --   --    Liver Function Tests: Recent Labs  Lab 12/18/19 2000 12/20/19 2032 12/21/19 1927  AST 63* 39 39  ALT 43 33 28  ALKPHOS 317* 287* 271*  BILITOT 0.7 0.7 1.0  PROT 6.9 7.4 7.1  ALBUMIN 2.6* 2.8* 2.8*   Coagulation Profile: No results for input(s): INR, PROTIME in the last 168 hours. HbA1C:  No results for input(s): HGBA1C in the last 72 hours. CBG: No results for input(s): GLUCAP in the last 168 hours.  Recent Results (from the past 240 hour(s))  SARS Coronavirus 2 by RT PCR (hospital order, performed in [facility] hospital lab) Nasopharyngeal Nasopharyngeal Swab     Status: None   Collection Time: 12/21/19  9:22 PM   Specimen: Nasopharyngeal Swab  Result Value Ref Range Status   SARS Coronavirus 2 NEGATIVE NEGATIVE Final    Comment: (NOTE) SARS-CoV-2 target nucleic acids are NOT DETECTED.  The SARS-CoV-2 RNA is generally detectable in upper and lower respiratory specimens during the acute phase of infection. The lowest concentration of SARS-CoV-2 viral copies this assay can detect is 250 copies / mL. A negative result does not preclude SARS-CoV-2 infection and should not be used as the sole basis for treatment or other patient management decisions.  A negative result may occur with improper specimen collection / handling, submission of specimen other than nasopharyngeal swab, presence of viral mutation(s) within the areas targeted by this assay, and inadequate number of viral copies (<250 copies / mL). A negative result must be combined with clinical observations, patient history, and epidemiological  information.  Fact Sheet for Patients:   StrictlyIdeas.no  Fact Sheet for Healthcare Providers: BankingDealers.co.za  This test is not yet approved or  cleared by the [place] FDA and has been authorized for detection and/or diagnosis of SARS-CoV-2 by FDA under an Emergency Use Authorization (EUA).  This EUA will remain in effect (meaning this test can be used) for the duration of the COVID-19 declaration under Section 564(b)(1) of the Act, 21 U.S.C. section 360bbb-3(b)(1), unless the authorization is terminated or revoked sooner.  Performed at [facility], [place] [address] [place], [place] [address]      Radiology Studies: DG Chest Port 1 View  Result Date: 12/21/2019 CLINICAL DATA:  Shortness of breath.  End-stage renal disease EXAM: PORTABLE CHEST 1 VIEW COMPARISON:  December 18, 2019 FINDINGS: There is ill-defined opacity in portions of each mid and lower lung region, similar to recent study, which is felt to represent a degree of interstitial edema. No consolidation. There is cardiomegaly with a degree of pulmonary venous hypertension. No adenopathy. There is aortic atherosclerosis. There is calcification in each carotid artery an each subclavian artery. No adenopathy. No bone lesion. IMPRESSION: Apparent interstitial edema in the mid and lower lung regions, similar to recent study. There is cardiomegaly with a degree of pulmonary vascular congestion. Suspect congestive heart failure. No consolidation evident. Extensive atherosclerotic calcification at multiple sites as noted above. Aortic Atherosclerosis (ICD10-I70.0). Electronically Signed   By: Lowella Grip III M.D.   On: 12/21/2019 19:06   CT RENAL STONE STUDY  Result Date: 12/22/2019 CLINICAL DATA:  Flank pain EXAM: CT ABDOMEN AND PELVIS WITHOUT CONTRAST TECHNIQUE: Multidetector CT imaging of the abdomen and pelvis was performed following the standard protocol without IV  contrast. COMPARISON:  05/07/2019 FINDINGS: Lower chest: There are small bilateral pleural effusions.The heart size is significantly enlarged. There is a small to moderate-sized pericardial effusion. Advanced coronary artery calcifications are noted. There is some atelectasis at the lung bases. Hepatobiliary: The liver is normal. Normal gallbladder.There is no biliary ductal dilation. Pancreas: Normal contours without ductal dilatation. No peripancreatic fluid collection. Spleen: Unremarkable. Adrenals/Urinary Tract: --Adrenal glands: Unremarkable. --Right kidney/ureter: The right kidney is atrophic. --Left kidney/ureter: Left kidney is atrophic. --Urinary bladder: Bladder is decompressed and therefore poorly evaluated. Stomach/Bowel: --Stomach/Duodenum: No hiatal hernia or  other gastric abnormality. Normal duodenal course and caliber. --Small bowel: Unremarkable. --Colon: Unremarkable. --Appendix: Normal. Vascular/Lymphatic: Atherosclerotic calcification is present within the non-aneurysmal abdominal aorta, without hemodynamically significant stenosis. --No retroperitoneal lymphadenopathy. --No mesenteric lymphadenopathy. --No pelvic or inguinal lymphadenopathy. Reproductive: Unremarkable Other: There is a large volume of abdominal ascites. There is body wall edema. Musculoskeletal. Renal osteodystrophy is noted. IMPRESSION: 1. Large volume of abdominal ascites. 2. Small bilateral pleural effusions and small to moderate-sized pericardial effusion. 3. Cardiomegaly with advanced coronary artery calcifications. 4. Atrophic kidneys. Aortic Atherosclerosis (ICD10-I70.0). Electronically Signed   By: Constance Holster M.D.   On: 12/22/2019 00:29    Marzetta Board, MD, PhD [facility]  Between 7 am - 7 pm I am available, please contact me via Amion or Securechat  Between 7 pm - 7 am I am not available, please contact night coverage MD/APP via Amion

## 2019-12-23 ENCOUNTER — Inpatient Hospital Stay (HOSPITAL_COMMUNITY): Payer: Medicare Other

## 2019-12-23 LAB — COMPREHENSIVE METABOLIC PANEL
ALT: 43 U/L (ref 0–44)
AST: 86 U/L — ABNORMAL HIGH (ref 15–41)
Albumin: 2.7 g/dL — ABNORMAL LOW (ref 3.5–5.0)
Alkaline Phosphatase: 243 U/L — ABNORMAL HIGH (ref 38–126)
Anion gap: 13 (ref 5–15)
BUN: 29 mg/dL — ABNORMAL HIGH (ref 6–20)
CO2: 27 mmol/L (ref 22–32)
Calcium: 9.3 mg/dL (ref 8.9–10.3)
Chloride: 92 mmol/L — ABNORMAL LOW (ref 98–111)
Creatinine, Ser: 4.11 mg/dL — ABNORMAL HIGH (ref 0.61–1.24)
GFR calc Af Amer: 17 mL/min — ABNORMAL LOW (ref >60)
GFR calc non Af Amer: 15 mL/min — ABNORMAL LOW (ref >60)
Glucose, Bld: 110 mg/dL — ABNORMAL HIGH (ref 70–99)
Potassium: 4.6 mmol/L (ref 3.5–5.1)
Sodium: 132 mmol/L — ABNORMAL LOW (ref 135–145)
Total Bilirubin: 0.8 mg/dL (ref 0.3–1.2)
Total Protein: 6.7 g/dL (ref 6.5–8.1)

## 2019-12-23 LAB — CBC
HCT: 27.1 % — ABNORMAL LOW (ref 39.0–52.0)
Hemoglobin: 9.1 g/dL — ABNORMAL LOW (ref 13.0–17.0)
MCH: 28.3 pg (ref 26.0–34.0)
MCHC: 33.6 g/dL (ref 30.0–36.0)
MCV: 84.2 fL (ref 80.0–100.0)
Platelets: 185 10*3/uL (ref 150–400)
RBC: 3.22 MIL/uL — ABNORMAL LOW (ref 4.22–5.81)
RDW: 16.4 % — ABNORMAL HIGH (ref 11.5–15.5)
WBC: 11.3 10*3/uL — ABNORMAL HIGH (ref 4.0–10.5)
nRBC: 0.4 % — ABNORMAL HIGH (ref 0.0–0.2)

## 2019-12-23 LAB — GLUCOSE, CAPILLARY
Glucose-Capillary: 116 mg/dL — ABNORMAL HIGH (ref 70–99)
Glucose-Capillary: 124 mg/dL — ABNORMAL HIGH (ref 70–99)
Glucose-Capillary: 127 mg/dL — ABNORMAL HIGH (ref 70–99)
Glucose-Capillary: 136 mg/dL — ABNORMAL HIGH (ref 70–99)
Glucose-Capillary: 145 mg/dL — ABNORMAL HIGH (ref 70–99)
Glucose-Capillary: 91 mg/dL (ref 70–99)

## 2019-12-23 MED ORDER — HYDROMORPHONE HCL 1 MG/ML IJ SOLN
1.0000 mg | Freq: Once | INTRAMUSCULAR | Status: AC | PRN
  Administered 2019-12-23: 1 mg via INTRAVENOUS
  Filled 2019-12-23: qty 1

## 2019-12-23 MED ORDER — IOHEXOL 350 MG/ML SOLN
60.0000 mL | Freq: Once | INTRAVENOUS | Status: AC | PRN
  Administered 2019-12-23: 60 mL via INTRAVENOUS

## 2019-12-23 MED ORDER — ZOLPIDEM TARTRATE 5 MG PO TABS
5.0000 mg | ORAL_TABLET | Freq: Every evening | ORAL | Status: DC | PRN
  Administered 2019-12-23: 5 mg via ORAL
  Filled 2019-12-23: qty 1

## 2019-12-23 MED ORDER — MELATONIN 5 MG PO TABS
5.0000 mg | ORAL_TABLET | Freq: Once | ORAL | Status: DC
  Filled 2019-12-23: qty 1

## 2019-12-23 NOTE — Progress Notes (Signed)
[facility] Progress Note   Subjective:   Feels much improved today.  Tolerated HD yesterday ok.    Objective Vitals:   12/23/19 0400 12/23/19 0500 12/23/19 0503 12/23/19 0840  BP:  (!) 101/55 (!) 101/55 102/62  Pulse: 70 68 71 70  Resp:   20 19  Temp:   99.9 F (37.7 C) 99 F (37.2 C)  TempSrc:   Oral Oral  SpO2: 97% 95% 97% 91%  Weight:   66 kg   Height:       Physical Exam General: cachetic, eating breakfast Heart: HR 70s, NSR monitor Lungs: clear, normal WOB Abdomen: soft, +TTP, mod distention Extremities: no edema Dialysis Access: RUE AVG +t/b  Additional Objective Labs: Basic Metabolic Panel: Recent Labs  Lab 12/22/19 0448 12/22/19 0800 12/23/19 0633  NA 131* 132* 132*  K 6.2* 5.7* 4.6  CL 89* 88* 92*  CO2 21* 22 27  GLUCOSE 55* 46* 110*  BUN 55* 56* 29*  CREATININE 6.70* 6.92* 4.11*  CALCIUM 9.6 9.5 9.3   Liver Function Tests: Recent Labs  Lab 12/20/19 2032 12/21/19 1927 12/23/19 0633  AST 39 39 86*  ALT 33 28 43  ALKPHOS 287* 271* 243*  BILITOT 0.7 1.0 0.8  PROT 7.4 7.1 6.7  ALBUMIN 2.8* 2.8* 2.7*   Recent Labs  Lab 12/18/19 2101 12/20/19 2032  LIPASE 37 33   CBC: Recent Labs  Lab 12/20/19 2032 12/20/19 2032 12/21/19 1837 12/21/19 1837 12/22/19 0448 12/22/19 0800 12/23/19 0633  WBC 9.7   < > 10.2   < > 11.4* 12.7* 11.3*  NEUTROABS  --   --  7.9*  --   --  10.2*  --   HGB 9.5*   < > 10.2*   < > 9.7* 10.0* 9.1*  HCT 28.3*   < > 30.4*   < > 29.2* 29.6* 27.1*  MCV 83.7  --  84.0  --  84.6 84.1 84.2  PLT 235   < > 278   < > 234 281 185   < > = values in this interval not displayed.   Blood Culture    Component Value Date/Time   SDES PERITONEAL FLUID 12/22/2019 0950   SPECREQUEST NONE 12/22/2019 0950   CULT  10/21/2019 1300    NO GROWTH 5 DAYS Performed at [facility], [place] [address] [place], [place] [address]    REPTSTATUS 12/22/2019 FINAL 12/22/2019 0950    Cardiac Enzymes: No results for  input(s): CKTOTAL, CKMB, CKMBINDEX, TROPONINI in the last 168 hours. CBG: Recent Labs  Lab 12/22/19 1127 12/22/19 1911 12/23/19 0012 12/23/19 0502 12/23/19 0837  GLUCAP 99 85 91 116* 145*   Iron Studies: No results for input(s): IRON, TIBC, TRANSFERRIN, FERRITIN in the last 72 hours. @lablastinr3 @ Studies/Results: US Paracentesis  Result Date: 12/22/2019 INDICATION: Patient with history of cirrhosis with recurrent ascites presents for diagnostic and therapeutic paracentesis EXAM: ULTRASOUND GUIDED DIAGNOSTIC AND THERAPEUTIC PARACENTESIS MEDICATIONS: Lidocaine 1% 10 mL COMPLICATIONS: None immediate. PROCEDURE: Informed written consent was obtained from the patient after a discussion of the risks, benefits and alternatives to treatment. A timeout was performed prior to the initiation of the procedure. Initial ultrasound scanning demonstrates a moderate amount of ascites within the right lower abdominal quadrant. The right lower abdomen was prepped and draped in the usual sterile fashion. 1% lidocaine was used for local anesthesia. Following this, a 19 gauge, 7-cm, Yueh catheter was introduced. An ultrasound image was saved for documentation purposes. The paracentesis  was performed. The catheter was removed and a dressing was applied. The patient tolerated the procedure well without immediate post procedural complication. FINDINGS: A total of approximately 4.6 L of serosanguineous fluid was removed. Samples were sent to the laboratory as requested by the clinical team. IMPRESSION: Successful ultrasound-guided diagnostic and therapeutic paracentesis yielding 4.6 liters of peritoneal fluid. Read by: Rushie Nyhan, NP Electronically Signed   By: Jerilynn Mages.  Shick M.D.   On: 12/22/2019 10:28   DG Chest Port 1 View  Result Date: 12/21/2019 CLINICAL DATA:  Shortness of breath.  End-stage renal disease EXAM: PORTABLE CHEST 1 VIEW COMPARISON:  December 18, 2019 FINDINGS: There is ill-defined opacity in portions of  each mid and lower lung region, similar to recent study, which is felt to represent a degree of interstitial edema. No consolidation. There is cardiomegaly with a degree of pulmonary venous hypertension. No adenopathy. There is aortic atherosclerosis. There is calcification in each carotid artery an each subclavian artery. No adenopathy. No bone lesion. IMPRESSION: Apparent interstitial edema in the mid and lower lung regions, similar to recent study. There is cardiomegaly with a degree of pulmonary vascular congestion. Suspect congestive heart failure. No consolidation evident. Extensive atherosclerotic calcification at multiple sites as noted above. Aortic Atherosclerosis (ICD10-I70.0). Electronically Signed   By: Lowella Grip III M.D.   On: 12/21/2019 19:06   CT RENAL STONE STUDY  Result Date: 12/22/2019 CLINICAL DATA:  Flank pain EXAM: CT ABDOMEN AND PELVIS WITHOUT CONTRAST TECHNIQUE: Multidetector CT imaging of the abdomen and pelvis was performed following the standard protocol without IV contrast. COMPARISON:  05/07/2019 FINDINGS: Lower chest: There are small bilateral pleural effusions.The heart size is significantly enlarged. There is a small to moderate-sized pericardial effusion. Advanced coronary artery calcifications are noted. There is some atelectasis at the lung bases. Hepatobiliary: The liver is normal. Normal gallbladder.There is no biliary ductal dilation. Pancreas: Normal contours without ductal dilatation. No peripancreatic fluid collection. Spleen: Unremarkable. Adrenals/Urinary Tract: --Adrenal glands: Unremarkable. --Right kidney/ureter: The right kidney is atrophic. --Left kidney/ureter: Left kidney is atrophic. --Urinary bladder: Bladder is decompressed and therefore poorly evaluated. Stomach/Bowel: --Stomach/Duodenum: No hiatal hernia or other gastric abnormality. Normal duodenal course and caliber. --Small bowel: Unremarkable. --Colon: Unremarkable. --Appendix: Normal.  Vascular/Lymphatic: Atherosclerotic calcification is present within the non-aneurysmal abdominal aorta, without hemodynamically significant stenosis. --No retroperitoneal lymphadenopathy. --No mesenteric lymphadenopathy. --No pelvic or inguinal lymphadenopathy. Reproductive: Unremarkable Other: There is a large volume of abdominal ascites. There is body wall edema. Musculoskeletal. Renal osteodystrophy is noted. IMPRESSION: 1. Large volume of abdominal ascites. 2. Small bilateral pleural effusions and small to moderate-sized pericardial effusion. 3. Cardiomegaly with advanced coronary artery calcifications. 4. Atrophic kidneys. Aortic Atherosclerosis (ICD10-I70.0). Electronically Signed   By: Constance Holster M.D.   On: 12/22/2019 00:29   Medications:  cefTRIAXone (ROCEPHIN)  IV 2 g (12/23/19 0413)    amiodarone  200 mg Oral BID   calcitRIOL  2 mcg Oral Q M,W,F-HD   Chlorhexidine Gluconate Cloth  6 each Topical Q0600   diltiazem  240 mg Oral Daily   feeding supplement (NEPRO CARB STEADY)  237 mL Oral BID BM   feeding supplement (PRO-STAT SUGAR FREE 64)  30 mL Oral BID   gabapentin  100 mg Oral TID   lidocaine  1 patch Transdermal Q24H   melatonin  5 mg Oral Once   metoprolol tartrate  100 mg Oral BID   multivitamin  1 tablet Oral QHS   pantoprazole  40 mg Oral Daily   sevelamer  carbonate  3,200 mg Oral TID with meals    Dialysis Orders:   [facility] MWF1 3x/wk 180 NR BFR 450 DFR 800 EDW 62  4hrs 2k/2Ca AVG mircera 150 q2wks last dose 6/4, venofer 50 weekly Calcitriol 2 TIW 6/9 Hb 9.2; 6/4 Phos 5.4, Ca 9.4; 10/2019 PTH 434 renvela 4 TIDAC 2 with snacks Last HD 6/9 3 hrs, post wt 63kg   Assessment/Plan **Sepsis secondary to SBP: tubid ascites with 583 TNC, 56% PMN.  Antibiotics per primary.   **ESRD on HD:  Plan HD Sat, next Monday to resume outpt schedule.  4hrs AVG UF 3L as tolerated.  EDW 62kg, post wt yesterday 61.2, may need new EDW at DC.   **Anemia of CKD: Hb 10, just rec'd ESA  outpt, CTM   **Secondary hyperparathyroidism:  Cont outpt calcitriol and binders; PTH and phos at goal at HD.    **Nutrition: renal MVI, prostat, RD added nepro   **Cirrhosis:  Recurrent ascites requiring LVPs   **A fib:  On nodal blocking agents. Not on anticoag.  Jannifer Hick MD 12/23/2019, 9:45 AM  [facility] Pager: [phone]

## 2019-12-23 NOTE — Progress Notes (Signed)
PROGRESS NOTE  [name] CBJ:[id number] DOB: [birth year] DOA: 12/21/2019 PCP: [name], FNP   LOS: 1 day   Brief Narrative / Interim history: 59 year old male with history of ESRD on HD MWF, paroxysmal A. fib, CAD, HTN, liver cirrhosis with prior history of SBP came into the hospital due to increased abdominal pain, he has been having this for several weeks but has recently worsened.  He reported some nausea and vomiting, denies any blood in the emesis.  No diarrhea.  He denies any fever or chills.  He came to the ER 24 hours ago, was treated symptomatically and discharged home, and he missed dialysis.  He has been having intermittent shortness of breath since missing dialysis.  No chest pain.  Subjective / 24h Interval events: He reports that he is feeling a little bit better.  Asking about when he can go home  Assessment & Plan: Principal Problem Acute hypoxic respiratory failure due to acute pulmonary edema -this is likely in the setting of the fact that he missed dialysis the day PTA being seen in the emergency room.  Nephrology consulted, underwent dialysis 6/12, respiratory status much better this morning and he is on room air  Active Problems Sepsis due to SBP in the setting of liver cirrhosis with ascites-his ascites is felt to be multifactorial in the setting of cirrhosis as well as right heart failure due to elevated protein in the ascitic fluid.  Underwent paracentesis on admission which showed greater than 250 PMNs supportive of SBP.  He was placed on ceftriaxone and clinically he is responding.  I discussed case with Dr. Linus Salmons with infectious disease and he recommends ceftazidime with dialysis for 2-3 additional doses upon discharge.  Gram stain was negative, cultures were not sent, they are added on but not sure if they could be done.  Sepsis physiology improving.  We will continue to monitor cultures  Severe pulmonary hypertension and right-sided heart failure  -his most recent 2D echo done in March 2021 showed low normal LV systolic function, severe LVH, mild to moderate AS, mild AI, mild to moderate MS, severe biatrial enlargement, severe RV dysfunction and severe TR with severe pulmonary hypertension. It was felt that this may actually contribute more to his ascites than his liver disease  ESRD-nephrology consulted, appreciate input  Hyperkalemia-repeat Lokelma, to be dialyzed  History of paroxysmal atrial fibrillation, pre-existing LBBB-on amiodarone, metoprolol, not on anticoagulation.  EKG reviewed, he appears to be sinus tachycardia with intraventricular conduction delay.  His tachycardia can be multifactorial due to hypoxia and shortness of breath as well as potential SBP.  Closely monitor heart rates after antibiotics, paracentesis, dialysis.  Continue beta-blockers.  Heart rate improved significantly after paracentesis as well as hemodialysis.  Heart rate in the 70s this morning  History of coronary artery disease-he denies any chest pain  Anemia in the setting of ESRD-chronic, follow CBC  Underlying COPD-no wheezing   Scheduled Meds: . amiodarone  200 mg Oral BID  . calcitRIOL  2 mcg Oral Q M,W,F-HD  . Chlorhexidine Gluconate Cloth  6 each Topical Q0600  . diltiazem  240 mg Oral Daily  . feeding supplement (NEPRO CARB STEADY)  237 mL Oral BID BM  . feeding supplement (PRO-STAT SUGAR FREE 64)  30 mL Oral BID  . gabapentin  100 mg Oral TID  . lidocaine  1 patch Transdermal Q24H  . melatonin  5 mg Oral Once  . metoprolol tartrate  100 mg Oral BID  .  multivitamin  1 tablet Oral QHS  . pantoprazole  40 mg Oral Daily  . sevelamer carbonate  3,200 mg Oral TID with meals   Continuous Infusions: . cefTRIAXone (ROCEPHIN)  IV 2 g (12/23/19 0413)   PRN Meds:.HYDROmorphone (DILAUDID) injection, lactulose, ondansetron (ZOFRAN) IV, sevelamer carbonate  DVT prophylaxis: SCDs Code Status: DNR Family Communication: no family at bedside    Status is: Observation  The patient will require care spanning > 2 midnights and should be moved to inpatient because: Add on ascitic fluid culture data, continue IV antibiotics, monitor WBC and potentially home within 24 hours if clinically stable  Dispo: The patient is from: Home              Anticipated d/c is to: Home              Anticipated d/c date is: 1 day              Patient currently is not medically stable to d/c.  Consultants:  Nephrology   Procedures:  none  Microbiology  none  Antimicrobials: Ceftriaxone 6/11    Objective: Vitals:   12/23/19 0400 12/23/19 0500 12/23/19 0503 12/23/19 0840  BP:  (!) 101/55 (!) 101/55 102/62  Pulse: 70 68 71 70  Resp:   20 19  Temp:   99.9 F (37.7 C) 99 F (37.2 C)  TempSrc:   Oral Oral  SpO2: 97% 95% 97% 91%  Weight:   66 kg   Height:        Intake/Output Summary (Last 24 hours) at 12/23/2019 1108 Last data filed at 12/22/2019 1826 Gross per 24 hour  Intake --  Output 2735 ml  Net -2735 ml   Filed Weights   12/22/19 1437 12/22/19 1826 12/23/19 0503  Weight: 64.1 kg 61.2 kg 66 kg    Examination:  Constitutional: No distress, in bed Eyes: No icterus ENMT: Moist mucous membranes Neck: normal, supple Respiratory: Clear on anterior auscultation, no wheezing Cardiovascular: Regular rate and rhythm, 4/6 holosystolic murmur Abdomen: Abdominal distention better, mildly tender throughout but no guarding or rebound Musculoskeletal: no clubbing / cyanosis.  Skin: No rashes appreciated Neurologic: No focal deficits  Data Reviewed: I have independently reviewed following labs and imaging studies   CBC: Recent Labs  Lab 12/20/19 2032 12/21/19 1837 12/22/19 0448 12/22/19 0800 12/23/19 0633  WBC 9.7 10.2 11.4* 12.7* 11.3*  NEUTROABS  --  7.9*  --  10.2*  --   HGB 9.5* 10.2* 9.7* 10.0* 9.1*  HCT 28.3* 30.4* 29.2* 29.6* 27.1*  MCV 83.7 84.0 84.6 84.1 84.2  PLT 235 278 234 281 161   Basic Metabolic  Panel: Recent Labs  Lab 12/18/19 2000 12/18/19 2101 12/20/19 2032 12/21/19 1927 12/22/19 0448 12/22/19 0800 12/23/19 0633  NA   < >  --  132* 132* 131* 132* 132*  K   < >  --  4.7 5.8* 6.2* 5.7* 4.6  CL   < >  --  90* 92* 89* 88* 92*  CO2   < >  --  26 24 21* 22 27  GLUCOSE   < >  --  86 77 55* 46* 110*  BUN   < >  --  36* 50* 55* 56* 29*  CREATININE   < >  --  5.31* 6.38* 6.70* 6.92* 4.11*  CALCIUM   < >  --  9.9 9.6 9.6 9.5 9.3  MG  --  2.2  --   --   --   --   --    < > =  values in this interval not displayed.   Liver Function Tests: Recent Labs  Lab 12/18/19 2000 12/20/19 2032 12/21/19 1927 12/23/19 0633  AST 63* 39 39 86*  ALT 43 33 28 43  ALKPHOS 317* 287* 271* 243*  BILITOT 0.7 0.7 1.0 0.8  PROT 6.9 7.4 7.1 6.7  ALBUMIN 2.6* 2.8* 2.8* 2.7*   Coagulation Profile: No results for input(s): INR, PROTIME in the last 168 hours. HbA1C: No results for input(s): HGBA1C in the last 72 hours. CBG: Recent Labs  Lab 12/22/19 1127 12/22/19 1911 12/23/19 0012 12/23/19 0502 12/23/19 0837  GLUCAP 99 85 91 116* 145*    Recent Results (from the past 240 hour(s))  SARS Coronavirus 2 by RT PCR (hospital order, performed in [facility] hospital lab) Nasopharyngeal Nasopharyngeal Swab     Status: None   Collection Time: 12/21/19  9:22 PM   Specimen: Nasopharyngeal Swab  Result Value Ref Range Status   SARS Coronavirus 2 NEGATIVE NEGATIVE Final    Comment: (NOTE) SARS-CoV-2 target nucleic acids are NOT DETECTED.  The SARS-CoV-2 RNA is generally detectable in upper and lower respiratory specimens during the acute phase of infection. The lowest concentration of SARS-CoV-2 viral copies this assay can detect is 250 copies / mL. A negative result does not preclude SARS-CoV-2 infection and should not be used as the sole basis for treatment or other patient management decisions.  A negative result may occur with improper specimen collection / handling, submission of specimen  other than nasopharyngeal swab, presence of viral mutation(s) within the areas targeted by this assay, and inadequate number of viral copies (<250 copies / mL). A negative result must be combined with clinical observations, patient history, and epidemiological information.  Fact Sheet for Patients:   StrictlyIdeas.no  Fact Sheet for Healthcare Providers: BankingDealers.co.za  This test is not yet approved or  cleared by the [place] FDA and has been authorized for detection and/or diagnosis of SARS-CoV-2 by FDA under an Emergency Use Authorization (EUA).  This EUA will remain in effect (meaning this test can be used) for the duration of the COVID-19 declaration under Section 564(b)(1) of the Act, 21 U.S.C. section 360bbb-3(b)(1), unless the authorization is terminated or revoked sooner.  Performed at [facility], [place] [address] [place], [place] [address]   Gram stain     Status: None   Collection Time: 12/22/19  9:50 AM   Specimen: PATH Cytology Peritoneal fluid  Result Value Ref Range Status   Specimen Description PERITONEAL FLUID  Final   Special Requests NONE  Final   Gram Stain   Final    WBC PRESENT, PREDOMINANTLY PMN NO ORGANISMS SEEN CYTOSPIN SMEAR Performed at [facility], [place] [address] [place], [place] [address]    Report Status 12/22/2019 FINAL  Final     Radiology Studies: No results found.  Marzetta Board, MD, PhD [facility]  Between 7 am - 7 pm I am available, please contact me via Amion or Securechat  Between 7 pm - 7 am I am not available, please contact night coverage MD/APP via Amion

## 2019-12-24 DIAGNOSIS — E875 Hyperkalemia: Secondary | ICD-10-CM

## 2019-12-24 DIAGNOSIS — K7469 Other cirrhosis of liver: Secondary | ICD-10-CM

## 2019-12-24 LAB — COMPREHENSIVE METABOLIC PANEL
ALT: 38 U/L (ref 0–44)
AST: 50 U/L — ABNORMAL HIGH (ref 15–41)
Albumin: 2.6 g/dL — ABNORMAL LOW (ref 3.5–5.0)
Alkaline Phosphatase: 255 U/L — ABNORMAL HIGH (ref 38–126)
Anion gap: 14 (ref 5–15)
BUN: 46 mg/dL — ABNORMAL HIGH (ref 6–20)
CO2: 25 mmol/L (ref 22–32)
Calcium: 9.6 mg/dL (ref 8.9–10.3)
Chloride: 93 mmol/L — ABNORMAL LOW (ref 98–111)
Creatinine, Ser: 5.65 mg/dL — ABNORMAL HIGH (ref 0.61–1.24)
GFR calc Af Amer: 12 mL/min — ABNORMAL LOW (ref >60)
GFR calc non Af Amer: 10 mL/min — ABNORMAL LOW (ref >60)
Glucose, Bld: 125 mg/dL — ABNORMAL HIGH (ref 70–99)
Potassium: 5 mmol/L (ref 3.5–5.1)
Sodium: 132 mmol/L — ABNORMAL LOW (ref 135–145)
Total Bilirubin: 0.6 mg/dL (ref 0.3–1.2)
Total Protein: 6.8 g/dL (ref 6.5–8.1)

## 2019-12-24 LAB — CBC
HCT: 26.6 % — ABNORMAL LOW (ref 39.0–52.0)
Hemoglobin: 9 g/dL — ABNORMAL LOW (ref 13.0–17.0)
MCH: 28.4 pg (ref 26.0–34.0)
MCHC: 33.8 g/dL (ref 30.0–36.0)
MCV: 83.9 fL (ref 80.0–100.0)
Platelets: 202 10*3/uL (ref 150–400)
RBC: 3.17 MIL/uL — ABNORMAL LOW (ref 4.22–5.81)
RDW: 15.8 % — ABNORMAL HIGH (ref 11.5–15.5)
WBC: 11.3 10*3/uL — ABNORMAL HIGH (ref 4.0–10.5)
nRBC: 0 % (ref 0.0–0.2)

## 2019-12-24 LAB — GLUCOSE, CAPILLARY: Glucose-Capillary: 126 mg/dL — ABNORMAL HIGH (ref 70–99)

## 2019-12-24 MED ORDER — SODIUM CHLORIDE 0.9 % IV SOLN
2.0000 g | INTRAVENOUS | Status: DC
  Administered 2019-12-24: 2 g via INTRAVENOUS
  Filled 2019-12-24: qty 2

## 2019-12-24 MED ORDER — OXYCODONE HCL 10 MG PO TABS: 10.0000 mg | ORAL_TABLET | Freq: Three times a day (TID) | ORAL | 0 refills | Status: DC | PRN

## 2019-12-24 MED ORDER — SODIUM CHLORIDE 0.9 % IV SOLN
1.0000 g | INTRAVENOUS | Status: DC
  Filled 2019-12-24: qty 1

## 2019-12-24 MED ORDER — CALCITRIOL 0.5 MCG PO CAPS
ORAL_CAPSULE | ORAL | Status: AC
  Administered 2019-12-24: 2 ug via ORAL
  Filled 2019-12-24: qty 4

## 2019-12-24 NOTE — Progress Notes (Signed)
Pharmacy Antibiotic Note  [name] is a 59 y.o. male admitted on 12/21/2019 with SBP.  Pharmacy has been consulted for fortaz dosing.  Plan: Fortaz 2gm IV after each HD MWF  Height: 5\' 9"  (175.3 cm) Weight: 66 kg (145 lb 8.1 oz) IBW/kg (Calculated) : 70.7  Temp (24hrs), Avg:98.6 F (37 C), Min:97.8 F (36.6 C), Max:99.9 F (37.7 C)  Recent Labs  Lab 12/20/19 2032 12/21/19 1837 12/21/19 1927 12/22/19 0448 12/22/19 0800 12/23/19 0633 12/24/19 0435  WBC  --  10.2  --  11.4* 12.7* 11.3* 11.3*  CREATININE   < >  --  6.38* 6.70* 6.92* 4.11* 5.65*   < > = values in this interval not displayed.    Estimated Creatinine Clearance: 13.5 mL/min (A) (by C-G formula based on SCr of 5.65 mg/dL (H)).    Allergies  Allergen Reactions  . Tramadol Itching and Other (See Comments)  . Morphine And Related Other (See Comments)    Stomach pain  . Pollen Extract     Other reaction(s): Sneezing (finding)  . Acetaminophen Nausea Only    Stomach ache Other reaction(s): Other (See Comments) Stomach ache  . Aspirin Other (See Comments) and Itching    STOMACH PAIN Other reaction(s): Unknown STOMACH PAIN  . Clonidine Derivatives Itching     Thank you for allowing pharmacy to be a part of this patient's care.  Beverlee Nims 12/24/2019 6:34 AM

## 2019-12-24 NOTE — Progress Notes (Signed)
[facility] Progress Note   Subjective:   Feels better today, on HD now  Objective Vitals:   12/24/19 1040 12/24/19 1045 12/24/19 1118 12/24/19 1123  BP: (!) 109/50 (!) 114/40 (!) 110/51   Pulse: (!) 43 (!) 46 63 60  Resp: 15 15 18    Temp:  98.2 F (36.8 C) 98 F (36.7 C)   TempSrc:  Oral Oral   SpO2: 96%  95%   Weight: 60 kg     Height:       Physical Exam General: cachetic, no distress Heart: HR 70s, NSR monitor Lungs: clear, normal WOB Abdomen: soft, +TTP, mod distention Extremities: no edema Dialysis Access: RUE AVG +t/b  Additional Objective Labs: Basic Metabolic Panel: Recent Labs  Lab 12/22/19 0800 12/23/19 0633 12/24/19 0435  NA 132* 132* 132*  K 5.7* 4.6 5.0  CL 88* 92* 93*  CO2 22 27 25   GLUCOSE 46* 110* 125*  BUN 56* 29* 46*  CREATININE 6.92* 4.11* 5.65*  CALCIUM 9.5 9.3 9.6   Liver Function Tests: Recent Labs  Lab 12/21/19 1927 12/23/19 0633 12/24/19 0435  AST 39 86* 50*  ALT 28 43 38  ALKPHOS 271* 243* 255*  BILITOT 1.0 0.8 0.6  PROT 7.1 6.7 6.8  ALBUMIN 2.8* 2.7* 2.6*   Recent Labs  Lab 12/18/19 2101 12/20/19 2032  LIPASE 37 33   CBC: Recent Labs  Lab 12/21/19 1837 12/21/19 1837 12/22/19 0448 12/22/19 0448 12/22/19 0800 12/23/19 0633 12/24/19 0435  WBC 10.2   < > 11.4*   < > 12.7* 11.3* 11.3*  NEUTROABS 7.9*  --   --   --  10.2*  --   --   HGB 10.2*   < > 9.7*   < > 10.0* 9.1* 9.0*  HCT 30.4*   < > 29.2*   < > 29.6* 27.1* 26.6*  MCV 84.0  --  84.6  --  84.1 84.2 83.9  PLT 278   < > 234   < > 281 185 202   < > = values in this interval not displayed.   Blood Culture    Component Value Date/Time   SDES PERITONEAL FLUID 12/22/2019 0950   SDES ASCITIC 12/22/2019 0950   SPECREQUEST NONE 12/22/2019 0950   SPECREQUEST NONE 12/22/2019 0950   CULT  12/22/2019 0950    NO GROWTH < 24 HOURS Performed at [facility], [place] [address] [place], [place] [address]    REPTSTATUS 12/22/2019 FINAL 12/22/2019  0950   REPTSTATUS PENDING 12/22/2019 0950    Cardiac Enzymes: No results for input(s): CKTOTAL, CKMB, CKMBINDEX, TROPONINI in the last 168 hours. CBG: Recent Labs  Lab 12/23/19 0837 12/23/19 1144 12/23/19 1650 12/23/19 2053 12/24/19 1114  GLUCAP 145* 127* 136* 124* 126*   Iron Studies: No results for input(s): IRON, TIBC, TRANSFERRIN, FERRITIN in the last 72 hours. @lablastinr3 @ Studies/Results: CT ANGIO CHEST PE W OR WO CONTRAST  Result Date: 12/23/2019 CLINICAL DATA:  Shortness of breath EXAM: CT ANGIOGRAPHY CHEST WITH CONTRAST TECHNIQUE: Multidetector CT imaging of the chest was performed using the standard protocol during bolus administration of intravenous contrast. Multiplanar CT image reconstructions and MIPs were obtained to evaluate the vascular anatomy. CONTRAST:  54mL OMNIPAQUE IOHEXOL 350 MG/ML SOLN COMPARISON:  None. FINDINGS: Cardiovascular: Satisfactory opacification of the pulmonary arteries to the segmental level. No evidence of pulmonary embolism. Gross cardiomegaly. Three-vessel coronary artery calcifications and/or stents. Small pericardial effusion. Aortic atherosclerosis. Mediastinum/Nodes: No enlarged mediastinal, hilar, or axillary lymph  nodes. Thyroid gland, trachea, and esophagus demonstrate no significant findings. Lungs/Pleura: Small bilateral pleural effusions and associated atelectasis or consolidation. Upper Abdomen: Ascites in the included upper abdomen. Musculoskeletal: No chest wall abnormality. No acute or significant osseous findings. Review of the MIP images confirms the above findings. IMPRESSION: 1. Negative examination for pulmonary embolism. 2. Small bilateral pleural effusions and associated atelectasis or consolidation. 3. Gross cardiomegaly with small pericardial effusion. 4. Three-vessel coronary artery calcifications and/or stents. 5. Ascites in the included upper abdomen. 6. Aortic Atherosclerosis (ICD10-I70.0). Electronically Signed   By: Eddie Candle M.D.   On: 12/23/2019 14:04   Medications: . cefTAZidime (FORTAZ)  IV 2 g (12/24/19 1017)   . amiodarone  200 mg Oral BID  . calcitRIOL  2 mcg Oral Q M,W,F-HD  . Chlorhexidine Gluconate Cloth  6 each Topical Q0600  . diltiazem  240 mg Oral Daily  . feeding supplement (NEPRO CARB STEADY)  237 mL Oral BID BM  . feeding supplement (PRO-STAT SUGAR FREE 64)  30 mL Oral BID  . gabapentin  100 mg Oral TID  . lidocaine  1 patch Transdermal Q24H  . melatonin  5 mg Oral Once  . metoprolol tartrate  100 mg Oral BID  . multivitamin  1 tablet Oral QHS  . pantoprazole  40 mg Oral Daily  . sevelamer carbonate  3,200 mg Oral TID with meals    Dialysis Orders:   [facility] MWF1 3x/wk 180 NR BFR 450 DFR 800 EDW 62  4hrs 2k/2Ca AVG mircera 150 q2wks last dose 6/4, venofer 50 weekly Calcitriol 2 TIW 6/9 Hb 9.2; 6/4 Phos 5.4, Ca 9.4; 10/2019 PTH 434 renvela 4 TIDAC 2 with snacks Last HD 6/9 3 hrs, post wt 63kg   Assessment/Plan **Sepsis secondary to SBP: turbid ascites with 583 TNC, 56% PMN.  Antibiotics per primary.   **ESRD on HD:  Plan HD Sat, next today to resume outpt schedule.  4hrs AVG UF 3L as tolerated.  EDW was 62kg, down to 60 kg today so far, maybe can lower edw at dc   **Anemia of CKD: Hb 10, just rec'd ESA outpt, CTM   **Secondary hyperparathyroidism:  Cont outpt calcitriol and binders; PTH and phos at goal at HD.    **Nutrition: renal MVI, prostat, RD added nepro   **Cirrhosis:  Recurrent ascites requiring LVPs   **A fib:  On nodal blocking agents. Not on anticoag.  [name], MD 12/24/2019, 2:43 PM

## 2019-12-24 NOTE — Discharge Summary (Signed)
Physician Discharge Summary  [name] GNF:[id number] DOB: 1961/05/15 DOA: 12/21/2019  PCP: [name], FNP  Admit date: 12/21/2019 Discharge date: 12/24/2019  Admitted From: Home Disposition: Home  Recommendations for Outpatient Follow-up:  1. Follow up with PCP in 1-2 weeks 2. Continue Fortaz 2 g IV after each HD for 2 additional sessions on Wednesday 6/16 and Friday 12/28/2019  Home Health: None Equipment/Devices: None  Discharge Condition: Stable CODE STATUS: DNR Diet recommendation: Renal diet  HPI: Per admitting MD, [name] is a 59 y.o. male with history of ESRD on hemodialysis on Monday Wednesday Friday, paroxysmal atrial fibrillation, CAD, hypertension, cirrhosis of the liver with previous history of SBP presents to the ER because of increasing abdominal pain which has been ongoing for many weeks but has recently worsened.  Had some nausea vomiting denies any blood in the vomitus denies any diarrhea.  Denies fever chills but had come to the ER 24th ago was treated symptomatically and discharged home.  During which patient missed his dialysis.  Presently has some shortness of breath since missing dialysis.  Denies chest pain.  Hospital Course / Discharge diagnoses:  Principal Problem Acute hypoxic respiratory failure due to acute pulmonary edema -this is likely in the setting of the fact that he missed dialysis the day PTA being seen in the emergency room.  Nephrology consulted, underwent dialysis 6/12 and 6/14, respiratory status much better, he is on room air and back to baseline  Active Problems Sepsis due to SBP in the setting of liver cirrhosis with ascites-his ascites is felt to be multifactorial in the setting of cirrhosis as well as right heart failure due to elevated protein in the ascitic fluid.  Underwent paracentesis on admission which showed greater than 250 PMNs supportive of SBP.  He was placed on ceftriaxone and clinically he is  responding.  I discussed case with Dr. Linus Salmons with infectious disease and he recommends ceftazidime with dialysis for 2-3 additional doses upon discharge.  Gram stain was negative, cultures are negative.  He will receive a dose of Fortaz with HD on the day of discharge and recommend 2 additional doses with his next HD on 6/16 and 6/18.  Sepsis physiology has resolved.  For his abdominal pain due to SBP he will be given a short course of oxycodone.  He normally follows up with pain as an outpatient Severe pulmonary hypertension and right-sided heart failure -his most recent 2D echo done in March 2021 showed low normal LV systolic function, severe LVH, mild to moderate AS, mild AI, mild to moderate MS, severe biatrial enlargement, severe RV dysfunction and severe TR with severe pulmonary hypertension. It was felt that this may actually contribute more to his ascites than his liver disease ESRD-nephrology consulted, appreciate input Hyperkalemia-resolved with Lokelma and dialysis History of paroxysmal atrial fibrillation, pre-existing LBBB-on amiodarone, metoprolol, not on anticoagulation. History of coronary artery disease-he denies any chest pain Anemia in the setting of ESRD-chronic, follow CBC Underlying COPD-no wheezing  Discharge Instructions   Allergies as of 12/24/2019      Reactions   Tramadol Itching, Other (See Comments)   Morphine And Related Other (See Comments)   Stomach pain   Pollen Extract    Other reaction(s): Sneezing (finding)   Acetaminophen Nausea Only   Stomach ache Other reaction(s): Other (See Comments) Stomach ache   Aspirin Other (See Comments), Itching   STOMACH PAIN Other reaction(s): Unknown STOMACH PAIN   Clonidine Derivatives Itching  Medication List    TAKE these medications   amiodarone 200 MG tablet Commonly known as: Pacerone Take 1 tablet (200 mg total) by mouth 2 (two) times daily.   diltiazem 240 MG 24 hr capsule Commonly known as: CARDIZEM  CD TAKE 1 CAPSULE BY MOUTH EVERY DAY What changed: how much to take   gabapentin 100 MG capsule Commonly known as: Neurontin Take 1 capsule (100 mg total) by mouth 3 (three) times daily for 20 doses.   hydrALAZINE 100 MG tablet Commonly known as: APRESOLINE Take 100 mg by mouth 2 (two) times daily.   hydrOXYzine 25 MG tablet Commonly known as: ATARAX/VISTARIL Take 25 mg by mouth 2 (two) times daily as needed for itching.   lactulose 10 GM/15ML solution Commonly known as: CHRONULAC Take 45 mLs (30 g total) by mouth daily as needed for mild constipation.   lidocaine 5 % Commonly known as: Lidoderm Place 1 patch onto the skin daily. Remove & Discard patch within 12 hours or as directed by MD   lidocaine 5 % Commonly known as: Lidoderm Place 1 patch onto the skin daily for 10 doses. Remove & Discard patch within 12 hours or as directed by MD   metoprolol tartrate 100 MG tablet Commonly known as: LOPRESSOR Take 1 tablet (100 mg total) by mouth 2 (two) times daily.   ondansetron 4 MG tablet Commonly known as: ZOFRAN Take 4 mg by mouth every 4 (four) hours as needed for nausea or vomiting.   Oxycodone HCl 10 MG Tabs Take 1 tablet (10 mg total) by mouth every 8 (eight) hours as needed for up to 3 days.   pantoprazole 40 MG tablet Commonly known as: PROTONIX Take 40 mg by mouth daily.   sevelamer carbonate 800 MG tablet Commonly known as: RENVELA Take 1,600-3,200 mg by mouth See admin instructions. Take 4 tablets (3200 mg) by mouth 3 times daily with meals and 2 tablets (1600 mg) with snacks        Consultations:  Nephrology  Procedures/Studies:  CT ANGIO CHEST PE W OR WO CONTRAST  Result Date: 12/23/2019 CLINICAL DATA:  Shortness of breath EXAM: CT ANGIOGRAPHY CHEST WITH CONTRAST TECHNIQUE: Multidetector CT imaging of the chest was performed using the standard protocol during bolus administration of intravenous contrast. Multiplanar CT image reconstructions and MIPs  were obtained to evaluate the vascular anatomy. CONTRAST:  41mL OMNIPAQUE IOHEXOL 350 MG/ML SOLN COMPARISON:  None. FINDINGS: Cardiovascular: Satisfactory opacification of the pulmonary arteries to the segmental level. No evidence of pulmonary embolism. Gross cardiomegaly. Three-vessel coronary artery calcifications and/or stents. Small pericardial effusion. Aortic atherosclerosis. Mediastinum/Nodes: No enlarged mediastinal, hilar, or axillary lymph nodes. Thyroid gland, trachea, and esophagus demonstrate no significant findings. Lungs/Pleura: Small bilateral pleural effusions and associated atelectasis or consolidation. Upper Abdomen: Ascites in the included upper abdomen. Musculoskeletal: No chest wall abnormality. No acute or significant osseous findings. Review of the MIP images confirms the above findings. IMPRESSION: 1. Negative examination for pulmonary embolism. 2. Small bilateral pleural effusions and associated atelectasis or consolidation. 3. Gross cardiomegaly with small pericardial effusion. 4. Three-vessel coronary artery calcifications and/or stents. 5. Ascites in the included upper abdomen. 6. Aortic Atherosclerosis (ICD10-I70.0). Electronically Signed   By: Eddie Candle M.D.   On: 12/23/2019 14:04   US Paracentesis  Result Date: 12/22/2019 INDICATION: Patient with history of cirrhosis with recurrent ascites presents for diagnostic and therapeutic paracentesis EXAM: ULTRASOUND GUIDED DIAGNOSTIC AND THERAPEUTIC PARACENTESIS MEDICATIONS: Lidocaine 1% 10 mL COMPLICATIONS: None immediate. PROCEDURE: Informed written consent  was obtained from the patient after a discussion of the risks, benefits and alternatives to treatment. A timeout was performed prior to the initiation of the procedure. Initial ultrasound scanning demonstrates a moderate amount of ascites within the right lower abdominal quadrant. The right lower abdomen was prepped and draped in the usual sterile fashion. 1% lidocaine was used  for local anesthesia. Following this, a 19 gauge, 7-cm, Yueh catheter was introduced. An ultrasound image was saved for documentation purposes. The paracentesis was performed. The catheter was removed and a dressing was applied. The patient tolerated the procedure well without immediate post procedural complication. FINDINGS: A total of approximately 4.6 L of serosanguineous fluid was removed. Samples were sent to the laboratory as requested by the clinical team. IMPRESSION: Successful ultrasound-guided diagnostic and therapeutic paracentesis yielding 4.6 liters of peritoneal fluid. Read by: Rushie Nyhan, NP Electronically Signed   By: Jerilynn Mages.  Shick M.D.   On: 12/22/2019 10:28   DG Chest Port 1 View  Result Date: 12/21/2019 CLINICAL DATA:  Shortness of breath.  End-stage renal disease EXAM: PORTABLE CHEST 1 VIEW COMPARISON:  December 18, 2019 FINDINGS: There is ill-defined opacity in portions of each mid and lower lung region, similar to recent study, which is felt to represent a degree of interstitial edema. No consolidation. There is cardiomegaly with a degree of pulmonary venous hypertension. No adenopathy. There is aortic atherosclerosis. There is calcification in each carotid artery an each subclavian artery. No adenopathy. No bone lesion. IMPRESSION: Apparent interstitial edema in the mid and lower lung regions, similar to recent study. There is cardiomegaly with a degree of pulmonary vascular congestion. Suspect congestive heart failure. No consolidation evident. Extensive atherosclerotic calcification at multiple sites as noted above. Aortic Atherosclerosis (ICD10-I70.0). Electronically Signed   By: Lowella Grip III M.D.   On: 12/21/2019 19:06   DG Chest Portable 1 View  Result Date: 12/18/2019 CLINICAL DATA:  Short of breath, abdominal distension EXAM: PORTABLE CHEST 1 VIEW COMPARISON:  12/12/2019 FINDINGS: Single frontal view of the chest demonstrates an enlarged cardiac silhouette. There is  central vascular congestion with bilateral ground-glass airspace disease, left greater than right, favor edema. No effusion or pneumothorax. Chronic elevation left hemidiaphragm. IMPRESSION: 1. Interstitial and alveolar opacities compatible with pulmonary edema. Electronically Signed   By: Randa Ngo M.D.   On: 12/18/2019 20:34   DG Chest Portable 1 View  Result Date: 12/12/2019 CLINICAL DATA:  Pulmonary edema EXAM: PORTABLE CHEST 1 VIEW COMPARISON:  Nov 25, 2019 FINDINGS: There is unchanged cardiomegaly and aortic knob calcifications. Prominence of the central pulmonary vasculature with increased interstitial markings throughout both lungs. There is stable elevation of the left hemidiaphragm. No acute osseous abnormality. IMPRESSION: Cardiomegaly and interstitial edema, slightly improved since the prior exam. Electronically Signed   By: Prudencio Pair M.D.   On: 12/12/2019 23:47   DG Chest Portable 1 View  Result Date: 11/25/2019 CLINICAL DATA:  Shortness of breath. Technologist notes state V-tach with pulses. EXAM: PORTABLE CHEST 1 VIEW COMPARISON:  11/14/2019 FINDINGS: Chronic cardiomegaly. Aortic atherosclerosis. Improved bibasilar aeration and decreased pleural effusions from prior exam. Pulmonary edema which is slightly worsened. Streaky right infrahilar opacities. No pneumothorax. Carotid and subclavian vascular calcifications. IMPRESSION: 1. Increased pulmonary edema over the last 10 days. 2. However improved bibasilar aeration and decreased pleural effusions from prior exam. 3. Streaky right infrahilar opacities may be atelectasis, pneumonia or aspiration. 4. Chronic cardiomegaly.  Aortic Atherosclerosis (ICD10-I70.0). Electronically Signed   By: Aurther Loft.D.  On: 11/25/2019 23:10   DG Abd Portable 1 View  Result Date: 12/18/2019 CLINICAL DATA:  Abdominal distension, recent paracentesis EXAM: PORTABLE ABDOMEN - 1 VIEW COMPARISON:  09/30/2019 FINDINGS: Supine frontal view of the abdomen  and pelvis demonstrate centralization of the bowel gas loops consistent with ascites. No obstruction or ileus. No masses or abnormal calcifications. Extensive atherosclerosis. IMPRESSION: 1. Findings compatible with ascites. 2. No evidence of obstruction or ileus. Electronically Signed   By: Randa Ngo M.D.   On: 12/18/2019 20:36   CT RENAL STONE STUDY  Result Date: 12/22/2019 CLINICAL DATA:  Flank pain EXAM: CT ABDOMEN AND PELVIS WITHOUT CONTRAST TECHNIQUE: Multidetector CT imaging of the abdomen and pelvis was performed following the standard protocol without IV contrast. COMPARISON:  05/07/2019 FINDINGS: Lower chest: There are small bilateral pleural effusions.The heart size is significantly enlarged. There is a small to moderate-sized pericardial effusion. Advanced coronary artery calcifications are noted. There is some atelectasis at the lung bases. Hepatobiliary: The liver is normal. Normal gallbladder.There is no biliary ductal dilation. Pancreas: Normal contours without ductal dilatation. No peripancreatic fluid collection. Spleen: Unremarkable. Adrenals/Urinary Tract: --Adrenal glands: Unremarkable. --Right kidney/ureter: The right kidney is atrophic. --Left kidney/ureter: Left kidney is atrophic. --Urinary bladder: Bladder is decompressed and therefore poorly evaluated. Stomach/Bowel: --Stomach/Duodenum: No hiatal hernia or other gastric abnormality. Normal duodenal course and caliber. --Small bowel: Unremarkable. --Colon: Unremarkable. --Appendix: Normal. Vascular/Lymphatic: Atherosclerotic calcification is present within the non-aneurysmal abdominal aorta, without hemodynamically significant stenosis. --No retroperitoneal lymphadenopathy. --No mesenteric lymphadenopathy. --No pelvic or inguinal lymphadenopathy. Reproductive: Unremarkable Other: There is a large volume of abdominal ascites. There is body wall edema. Musculoskeletal. Renal osteodystrophy is noted. IMPRESSION: 1. Large volume of  abdominal ascites. 2. Small bilateral pleural effusions and small to moderate-sized pericardial effusion. 3. Cardiomegaly with advanced coronary artery calcifications. 4. Atrophic kidneys. Aortic Atherosclerosis (ICD10-I70.0). Electronically Signed   By: Constance Holster M.D.   On: 12/22/2019 00:29   IR Paracentesis  Result Date: 12/12/2019 INDICATION: Patient with history of cirrhosis, ascites. Request is made for diagnostic and therapeutic paracentesis. EXAM: ULTRASOUND GUIDED DIAGNOSTIC AND THERAPEUTIC PARACENTESIS MEDICATIONS: 10 mL 1% lidocaine COMPLICATIONS: None immediate. PROCEDURE: Informed written consent was obtained from the patient after a discussion of the risks, benefits and alternatives to treatment. A timeout was performed prior to the initiation of the procedure. Initial ultrasound scanning demonstrates a moderate amount of ascites within the left lateral abdomen. The left lateral abdomen was prepped and draped in the usual sterile fashion. 1% lidocaine with epinephrine was used for local anesthesia. Following this, a 19 gauge, 7-cm, Yueh catheter was introduced. After about 400 mL were removed the catheter became lodged against bowel and flow could not be restored despite multiple attempts to dislodge the catheter. The Yueh catheter was removed and a Safe-T-Centesis was introduced. The paracentesis was performed. The catheter was removed and a dressing was applied. The patient tolerated the procedure well without immediate post procedural complication. FINDINGS: A total of approximately 3.0 liters of bloody fluid was removed. Samples were sent to the laboratory as requested by the clinical team. IMPRESSION: Successful ultrasound-guided diagnostic and therapeutic paracentesis yielding 3.0 liters of peritoneal fluid. Read by: Brynda Greathouse PA-C Electronically Signed   By: Lucrezia Europe M.D.   On: 12/12/2019 10:48     Subjective: - no chest pain, shortness of breath, no abdominal pain, nausea  or vomiting.   Discharge Exam: BP (!) 105/43 (BP Location: Right Arm)   Pulse (!) 45   Temp 97.8  F (36.6 C) (Oral)   Resp 18   Ht 5\' 9"  (1.753 m)   Wt 61.5 kg   SpO2 95%   BMI 20.02 kg/m   General: Pt is alert, awake, not in acute distress Cardiovascular: RRR, S1/S2 +, no rubs, no gallops Respiratory: CTA bilaterally, no wheezing, no rhonchi Abdominal: Soft, NT, ND, bowel sounds + Extremities: no edema, no cyanosis  The results of significant diagnostics from this hospitalization (including imaging, microbiology, ancillary and laboratory) are listed below for reference.     Microbiology: Recent Results (from the past 240 hour(s))  SARS Coronavirus 2 by RT PCR (hospital order, performed in [facility] hospital lab) Nasopharyngeal Nasopharyngeal Swab     Status: None   Collection Time: 12/21/19  9:22 PM   Specimen: Nasopharyngeal Swab  Result Value Ref Range Status   SARS Coronavirus 2 NEGATIVE NEGATIVE Final    Comment: (NOTE) SARS-CoV-2 target nucleic acids are NOT DETECTED.  The SARS-CoV-2 RNA is generally detectable in upper and lower respiratory specimens during the acute phase of infection. The lowest concentration of SARS-CoV-2 viral copies this assay can detect is 250 copies / mL. A negative result does not preclude SARS-CoV-2 infection and should not be used as the sole basis for treatment or other patient management decisions.  A negative result may occur with improper specimen collection / handling, submission of specimen other than nasopharyngeal swab, presence of viral mutation(s) within the areas targeted by this assay, and inadequate number of viral copies (<250 copies / mL). A negative result must be combined with clinical observations, patient history, and epidemiological information.  Fact Sheet for Patients:   StrictlyIdeas.no  Fact Sheet for Healthcare Providers: BankingDealers.co.za  This test is not  yet approved or  cleared by the [place] FDA and has been authorized for detection and/or diagnosis of SARS-CoV-2 by FDA under an Emergency Use Authorization (EUA).  This EUA will remain in effect (meaning this test can be used) for the duration of the COVID-19 declaration under Section 564(b)(1) of the Act, 21 U.S.C. section 360bbb-3(b)(1), unless the authorization is terminated or revoked sooner.  Performed at [facility], [place] [address], [place], [place] [address]   Gram stain     Status: None   Collection Time: 12/22/19  9:50 AM   Specimen: PATH Cytology Peritoneal fluid  Result Value Ref Range Status   Specimen Description PERITONEAL FLUID  Final   Special Requests NONE  Final   Gram Stain   Final    WBC PRESENT, PREDOMINANTLY PMN NO ORGANISMS SEEN CYTOSPIN SMEAR Performed at [facility], [place] [address] [place], [place] [address]    Report Status 12/22/2019 FINAL  Final  Body fluid culture     Status: None (Preliminary result)   Collection Time: 12/22/19  9:50 AM   Specimen: Ascitic; Body Fluid  Result Value Ref Range Status   Specimen Description ASCITIC  Final   Special Requests NONE  Final   Culture   Final    NO GROWTH < 24 HOURS Performed at [facility], [address], [place], [place] [address]    Report Status PENDING  Incomplete     Labs: Basic Metabolic Panel: Recent Labs  Lab 12/18/19 2101 12/20/19 2032 12/21/19 1927 12/22/19 0448 12/22/19 0800 12/23/19 0633 12/24/19 0435  NA  --    < > 132* 131* 132* 132* 132*  K  --    < > 5.8* 6.2* 5.7* 4.6 5.0  CL  --    < >  92* 89* 88* 92* 93*  CO2  --    < > 24 21* 22 27 25   GLUCOSE  --    < > 77 55* 46* 110* 125*  BUN  --    < > 50* 55* 56* 29* 46*  CREATININE  --    < > 6.38* 6.70* 6.92* 4.11* 5.65*  CALCIUM  --    < > 9.6 9.6 9.5 9.3 9.6  MG 2.2  --   --   --   --   --   --    < > = values in this interval not displayed.   Liver Function Tests: Recent Labs  Lab  12/18/19 2000 12/20/19 2032 12/21/19 1927 12/23/19 0633 12/24/19 0435  AST 63* 39 39 86* 50*  ALT 43 33 28 43 38  ALKPHOS 317* 287* 271* 243* 255*  BILITOT 0.7 0.7 1.0 0.8 0.6  PROT 6.9 7.4 7.1 6.7 6.8  ALBUMIN 2.6* 2.8* 2.8* 2.7* 2.6*   CBC: Recent Labs  Lab 12/21/19 1837 12/22/19 0448 12/22/19 0800 12/23/19 0633 12/24/19 0435  WBC 10.2 11.4* 12.7* 11.3* 11.3*  NEUTROABS 7.9*  --  10.2*  --   --   HGB 10.2* 9.7* 10.0* 9.1* 9.0*  HCT 30.4* 29.2* 29.6* 27.1* 26.6*  MCV 84.0 84.6 84.1 84.2 83.9  PLT 278 234 281 185 202   CBG: Recent Labs  Lab 12/23/19 0502 12/23/19 0837 12/23/19 1144 12/23/19 1650 12/23/19 2053  GLUCAP 116* 145* 127* 136* 124*   Hgb A1c No results for input(s): HGBA1C in the last 72 hours. Lipid Profile No results for input(s): CHOL, HDL, LDLCALC, TRIG, CHOLHDL, LDLDIRECT in the last 72 hours. Thyroid function studies No results for input(s): TSH, T4TOTAL, T3FREE, THYROIDAB in the last 72 hours.  Invalid input(s): FREET3 Urinalysis    Component Value Date/Time   COLORURINE YELLOW 07/16/2011 1619   APPEARANCEUR CLEAR 07/16/2011 1619   LABSPEC 1.018 07/16/2011 1619   PHURINE 7.5 07/16/2011 1619   GLUCOSEU 250 (A) 07/16/2011 1619   HGBUR MODERATE (A) 07/16/2011 1619   BILIRUBINUR SMALL (A) 07/16/2011 1619   KETONESUR NEGATIVE 07/16/2011 1619   PROTEINUR >300 (A) 07/16/2011 1619   UROBILINOGEN 1.0 07/16/2011 1619   NITRITE NEGATIVE 07/16/2011 1619   LEUKOCYTESUR SMALL (A) 07/16/2011 1619    FURTHER DISCHARGE INSTRUCTIONS:   Get Medicines reviewed and adjusted: Please take all your medications with you for your next visit with your Primary MD   Laboratory/radiological data: Please request your Primary MD to go over all hospital tests and procedure/radiological results at the follow up, please ask your Primary MD to get all Hospital records sent to his/her office.   In some cases, they will be blood work, cultures and biopsy results  pending at the time of your discharge. Please request that your primary care M.D. goes through all the records of your hospital data and follows up on these results.   Also Note the following: If you experience worsening of your admission symptoms, develop shortness of breath, life threatening emergency, suicidal or homicidal thoughts you must seek medical attention immediately by calling 911 or calling your MD immediately  if symptoms less severe.   You must read complete instructions/literature along with all the possible adverse reactions/side effects for all the Medicines you take and that have been prescribed to you. Take any new Medicines after you have completely understood and accpet all the possible adverse reactions/side effects.    Do not drive when taking Pain medications  or sleeping medications (Benzodaizepines)   Do not take more than prescribed Pain, Sleep and Anxiety Medications. It is not advisable to combine anxiety,sleep and pain medications without talking with your primary care practitioner   Special Instructions: If you have smoked or chewed Tobacco  in the last 2 yrs please stop smoking, stop any regular Alcohol  and or any Recreational drug use.   Wear Seat belts while driving.   Please note: You were cared for by a hospitalist during your hospital stay. Once you are discharged, your primary care physician will handle any further medical issues. Please note that NO REFILLS for any discharge medications will be authorized once you are discharged, as it is imperative that you return to your primary care physician (or establish a relationship with a primary care physician if you do not have one) for your post hospital discharge needs so that they can reassess your need for medications and monitor your lab values.  Time coordinating discharge: 40 minutes  SIGNED:  Marzetta Board, MD, PhD 12/24/2019, 10:40 AM

## 2019-12-25 ENCOUNTER — Ambulatory Visit: Payer: Medicare Other | Admitting: Cardiology

## 2019-12-25 ENCOUNTER — Telehealth: Payer: Self-pay | Admitting: Physician Assistant

## 2019-12-25 NOTE — Telephone Encounter (Signed)
Transition of care contact from inpatient facility  Date of Discharge: 12/24/19 Date of Contact: 12/25/19 Method of contact: Phone  Attempted to contact patient to discuss transition of care from inpatient admission. Patient did not answer the phone. Message was left on the patient's voicemail with call back instructions.  Anice Paganini, PA-C 12/25/2019, 3:02 PM  [facility] Pager: [phone]

## 2019-12-26 LAB — BODY FLUID CULTURE: Culture: NO GROWTH

## 2019-12-27 ENCOUNTER — Ambulatory Visit: Payer: Medicare Other | Admitting: Cardiology

## 2019-12-27 ENCOUNTER — Encounter: Payer: Self-pay | Admitting: Cardiology

## 2019-12-27 ENCOUNTER — Other Ambulatory Visit: Payer: Self-pay

## 2019-12-27 VITALS — BP 149/66 | HR 67 | Resp 16 | Ht 69.0 in | Wt 144.0 lb

## 2019-12-27 DIAGNOSIS — I10 Essential (primary) hypertension: Secondary | ICD-10-CM

## 2019-12-27 DIAGNOSIS — G63 Polyneuropathy in diseases classified elsewhere: Secondary | ICD-10-CM

## 2019-12-27 DIAGNOSIS — I447 Left bundle-branch block, unspecified: Secondary | ICD-10-CM

## 2019-12-27 DIAGNOSIS — K219 Gastro-esophageal reflux disease without esophagitis: Secondary | ICD-10-CM

## 2019-12-27 DIAGNOSIS — I2781 Cor pulmonale (chronic): Secondary | ICD-10-CM

## 2019-12-27 DIAGNOSIS — I480 Paroxysmal atrial fibrillation: Secondary | ICD-10-CM

## 2019-12-27 MED ORDER — PANTOPRAZOLE SODIUM 40 MG PO TBEC: 40.0000 mg | DELAYED_RELEASE_TABLET | Freq: Every day | ORAL | 0 refills | Status: DC

## 2019-12-27 MED ORDER — GABAPENTIN 100 MG PO CAPS: 100.0000 mg | ORAL_CAPSULE | Freq: Three times a day (TID) | ORAL | 0 refills | Status: DC

## 2019-12-27 NOTE — Progress Notes (Signed)
Primary Physician/Referring:  [name], FNP  Patient ID: [name], male    DOB: 01-Jan-1961, 59 y.o.   MRN: [id number]  Chief Complaint  Patient presents with  . Paroxysmal Atrial Fibrillation  . Follow-up    3 month   HPI:    [name]  is a 59 y.o. male  with very complex medical problems including severe COPD and chronic cor pulmonale with pulmonary hypertension, coronary artery disease s/p stenting to mid RCA in 2018, paroxysmal atrial fibrillation with RVR, hypertensive cardiomyopathy, dietary and medication noncompliance, end-stage renal disease on hemodialysis, chronic hepatitis B and C, tobacco use, smokes 1/2 ppd, h/o marijuana use and prior cocaine use quit in 2017. Not on anticoagulation due to cirrhosis of liver and compliance with Warfarin.   He has had multiple ER visits for A fib with RVR, syncope, shortness of breath and hyperkalemia.  He has also been in the hospital with acute respiratory distress and pulmonary edema due to missed dialysis.  Recently seen in the emergency room for pulmonary edema and also ascites felt to be more of an exudative ascites along with elevated white count suggestive of peritonitis.  He feels poorly overall.  He presents for his 6-week office visit with me.  No leg edema, dyspnea has remained stable, continues to smoke still.  States that he has not missed his dialysis.  Requests refill for Neurontin due to severe pain and nausea medications.  Past Medical History:  Diagnosis Date  . Anemia   . Anxiety   . Arthritis    left shoulder  . Atherosclerosis of aorta ([place])   . Cardiomegaly   . Chest pain    DATE UNKNOWN, C/O PERIODICALLY  . Cocaine abuse ([place])   . COPD exacerbation ([place]) 08/17/2016  . Coronary artery disease    stent 02/22/17  . ESRD (end stage renal disease) on dialysis ([place])    "[name]; MWF" (07/04/2017)  . GERD (gastroesophageal reflux disease)    DATE UNKNOWN  . Hemorrhoids   .  Hepatitis B, chronic ([place])   . Hepatitis C   . History of kidney stones   . Hyperkalemia   . Hypertension   . Kidney failure   . Metabolic bone disease    Patient denies  . Mitral stenosis   . Myocardial infarction ([place])   . Pneumonia   . Pulmonary edema   . Solitary rectal ulcer syndrome 07/2017   at flex sig for rectal bleeding  . Tubular adenoma of colon    Past Surgical History:  Procedure Laterality Date  . A/V FISTULAGRAM Left 05/26/2017   Procedure: A/V FISTULAGRAM;  Surgeon: Conrad Samoset, MD;  Location: [facility];  Service: Cardiovascular;  Laterality: Left;  . A/V FISTULAGRAM Right 11/18/2017   Procedure: A/V FISTULAGRAM - Right Arm;  Surgeon: Elam Dutch, MD;  Location: [facility];  Service: Cardiovascular;  Laterality: Right;  . APPLICATION OF WOUND VAC Left 06/14/2017   Procedure: APPLICATION OF WOUND VAC;  Surgeon: Katha Cabal, MD;  Location: [facility];  Service: Vascular;  Laterality: Left;  . AV FISTULA PLACEMENT  2012   BELIEVED WAS PLACED IN JUNE  . AV FISTULA PLACEMENT Right 08/09/2017   Procedure: Creation Right arm ARTERIOVENOUS BRACHIOCEPOHALIC FISTULA;  Surgeon: Elam Dutch, MD;  Location: [facility] OR;  Service: Vascular;  Laterality: Right;  . AV FISTULA PLACEMENT Right 11/22/2017   Procedure: INSERTION OF ARTERIOVENOUS (AV) GORE-TEX GRAFT RIGHT UPPER  ARM;  Surgeon: Elam Dutch, MD;  Location: [place];  Service: Vascular;  Laterality: Right;  . BIOPSY  01/25/2018   Procedure: BIOPSY;  Surgeon: Jerene Bears, MD;  Location: [place];  Service: Gastroenterology;;  . BIOPSY  04/10/2019   Procedure: BIOPSY;  Surgeon: Jerene Bears, MD;  Location: [facility];  Service: Gastroenterology;;  . COLONOSCOPY    . COLONOSCOPY WITH PROPOFOL N/A 01/25/2018   Procedure: COLONOSCOPY WITH PROPOFOL;  Surgeon: Jerene Bears, MD;  Location: [place];  Service: Gastroenterology;  Laterality: N/A;  . CORONARY STENT INTERVENTION N/A 02/22/2017     Procedure: CORONARY STENT INTERVENTION;  Surgeon: Nigel Mormon, MD;  Location: [place] [facility];  Service: Cardiovascular;  Laterality: N/A;  . ESOPHAGOGASTRODUODENOSCOPY (EGD) WITH PROPOFOL N/A 01/25/2018   Procedure: ESOPHAGOGASTRODUODENOSCOPY (EGD) WITH PROPOFOL;  Surgeon: Jerene Bears, MD;  Location: [place];  Service: Gastroenterology;  Laterality: N/A;  . ESOPHAGOGASTRODUODENOSCOPY (EGD) WITH PROPOFOL N/A 04/10/2019   Procedure: ESOPHAGOGASTRODUODENOSCOPY (EGD) WITH PROPOFOL;  Surgeon: Jerene Bears, MD;  Location: [facility];  Service: Gastroenterology;  Laterality: N/A;  . FLEXIBLE SIGMOIDOSCOPY N/A 07/15/2017   Procedure: FLEXIBLE SIGMOIDOSCOPY;  Surgeon: Carol Ada, MD;  Location: [place];  Service: Endoscopy;  Laterality: N/A;  . HEMORRHOID BANDING    . I & D EXTREMITY Left 06/01/2017   Procedure: IRRIGATION AND DEBRIDEMENT LEFT ARM HEMATOMA WITH LIGATION OF LEFT ARM AV FISTULA;  Surgeon: Elam Dutch, MD;  Location: [place];  Service: Vascular;  Laterality: Left;  . I & D EXTREMITY Left 06/14/2017   Procedure: IRRIGATION AND DEBRIDEMENT EXTREMITY;  Surgeon: Katha Cabal, MD;  Location: [facility];  Service: Vascular;  Laterality: Left;  . INSERTION OF DIALYSIS CATHETER  05/30/2017  . INSERTION OF DIALYSIS CATHETER N/A 05/30/2017   Procedure: INSERTION OF DIALYSIS CATHETER;  Surgeon: Elam Dutch, MD;  Location: [place];  Service: Vascular;  Laterality: N/A;  . IR PARACENTESIS  08/30/2017  . IR PARACENTESIS  09/29/2017  . IR PARACENTESIS  10/28/2017  . IR PARACENTESIS  11/09/2017  . IR PARACENTESIS  11/16/2017  . IR PARACENTESIS  11/28/2017  . IR PARACENTESIS  12/01/2017  . IR PARACENTESIS  12/06/2017  . IR PARACENTESIS  01/03/2018  . IR PARACENTESIS  01/23/2018  . IR PARACENTESIS  02/07/2018  . IR PARACENTESIS  02/21/2018  . IR PARACENTESIS  03/06/2018  . IR PARACENTESIS  03/17/2018  . IR PARACENTESIS  04/04/2018  . IR PARACENTESIS  12/28/2018  . IR  PARACENTESIS  01/08/2019  . IR PARACENTESIS  01/23/2019  . IR PARACENTESIS  02/01/2019  . IR PARACENTESIS  02/19/2019  . IR PARACENTESIS  03/01/2019  . IR PARACENTESIS  03/15/2019  . IR PARACENTESIS  04/03/2019  . IR PARACENTESIS  04/12/2019  . IR PARACENTESIS  05/01/2019  . IR PARACENTESIS  05/08/2019  . IR PARACENTESIS  05/24/2019  . IR PARACENTESIS  06/12/2019  . IR PARACENTESIS  07/09/2019  . IR PARACENTESIS  07/27/2019  . IR PARACENTESIS  08/09/2019  . IR PARACENTESIS  08/21/2019  . IR PARACENTESIS  09/17/2019  . IR PARACENTESIS  10/05/2019  . IR PARACENTESIS  10/29/2019  . IR PARACENTESIS  11/08/2019  . IR PARACENTESIS  12/12/2019  . IR RADIOLOGIST EVAL & MGMT  02/14/2018  . IR RADIOLOGIST EVAL & MGMT  02/22/2019  . LEFT HEART CATH AND CORONARY ANGIOGRAPHY N/A 02/22/2017   Procedure: LEFT HEART CATH AND CORONARY ANGIOGRAPHY;  Surgeon: Nigel Mormon, MD;  Location:  [facility];  Service: Cardiovascular;  Laterality: N/A;  . LIGATION OF ARTERIOVENOUS  FISTULA Left 12/18/319   Procedure: Plication of Left Arm Arteriovenous Fistula;  Surgeon: Elam Dutch, MD;  Location: [place];  Service: Vascular;  Laterality: Left;  . POLYPECTOMY    . POLYPECTOMY  01/25/2018   Procedure: POLYPECTOMY;  Surgeon: Jerene Bears, MD;  Location: [place];  Service: Gastroenterology;;  . REVISON OF ARTERIOVENOUS FISTULA Left 09/04/8248   Procedure: PLICATION OF DISTAL ANEURYSMAL SEGEMENT OF LEFT UPPER ARM ARTERIOVENOUS FISTULA;  Surgeon: Elam Dutch, MD;  Location: [place];  Service: Vascular;  Laterality: Left;  . REVISON OF ARTERIOVENOUS FISTULA Left 0/37/0488   Procedure: Plication of Left Upper Arm Fistula ;  Surgeon: Waynetta Sandy, MD;  Location: [place];  Service: Vascular;  Laterality: Left;  . SKIN GRAFT SPLIT THICKNESS LEG / FOOT Left    SKIN GRAFT SPLIT THICKNESS LEFT ARM DONOR SITE: LEFT ANTERIOR THIGH  . SKIN SPLIT GRAFT Left 07/04/2017   Procedure: SKIN GRAFT SPLIT THICKNESS LEFT  ARM DONOR SITE: LEFT ANTERIOR THIGH;  Surgeon: Elam Dutch, MD;  Location: [place];  Service: Vascular;  Laterality: Left;  . THROMBECTOMY W/ EMBOLECTOMY Left 06/05/2017   Procedure: EXPLORATION OF LEFT ARM FOR BLEEDING; OVERSEWED PROXIMAL FISTULA;  Surgeon: Angelia Mould, MD;  Location: [place];  Service: Vascular;  Laterality: Left;  . WOUND EXPLORATION Left 06/03/2017   Procedure: WOUND EXPLORATION WITH WOUND VAC APPLICATION TO LEFT ARM;  Surgeon: Angelia Mould, MD;  Location: [facility];  Service: Vascular;  Laterality: Left;   Family History  Problem Relation Age of Onset  . Heart disease Mother   . Lung cancer Mother   . Heart disease Father   . Malignant hyperthermia Father   . COPD Father   . Throat cancer Sister   . Esophageal cancer Sister   . Hypertension Other   . COPD Other   . Colon cancer Neg Hx   . Colon polyps Neg Hx   . Rectal cancer Neg Hx   . Stomach cancer Neg Hx     Social History   Tobacco Use  . Smoking status: Current Every Day Smoker    Packs/day: 0.50    Years: 43.00    Pack years: 21.50    Types: Cigarettes    Start date: 08/13/1973  . Smokeless tobacco: Never Used  . Tobacco comment: i dont know i just make   Substance Use Topics  . Alcohol use: Not Currently    Comment: quit drinking in 2017   Marital Status: Single   ROS  Review of Systems  Cardiovascular: Positive for dyspnea on exertion (chronic) and palpitations. Negative for leg swelling and syncope.  Respiratory: Positive for wheezing. Negative for hemoptysis.   Endocrine: Negative for cold intolerance.  Hematologic/Lymphatic: Does not bruise/bleed easily.  Gastrointestinal: Positive for bloating. Negative for hematochezia and melena.   Objective  Blood pressure (!) 149/66, pulse 67, resp. rate 16, height 5\' 9"  (1.753 m), weight 144 lb (65.3 kg), SpO2 96 %.  Vitals with BMI 12/27/2019 12/24/2019 12/24/2019  Height 5\' 9"  - -  Weight 144 lbs - -  BMI 89.16 - -  Systolic  945 - 038  Diastolic 66 - 51  Pulse 67 60 63  Some encounter information is confidential and restricted. Go to Review Flowsheets activity to see all data.     Physical Exam Vitals reviewed.  Constitutional:      General:  He is not in acute distress.    Appearance: He is cachectic. He is ill-appearing.  Cardiovascular:     Rate and Rhythm: Normal rate and regular rhythm.     Heart sounds: Murmur heard.  Early systolic murmur is present with a grade of 2/6 at the upper right sternal border.  Low-pitched rumbling crescendo presystolic murmur is present with a grade of 3/4 at the apex.      Comments: S1 normal and S2 loud. No murmur. No JVD Right arm AV graft noted Pulmonary:     Effort: Pulmonary effort is normal. No accessory muscle usage or respiratory distress.     Breath sounds: Normal breath sounds.  Abdominal:     General: Bowel sounds are normal. There is distension.     Palpations: Abdomen is soft.     Comments: Ascites present  Musculoskeletal:        General: Normal range of motion.    Laboratory examination:   Recent Labs    12/22/19 0800 12/23/19 0633 12/24/19 0435  NA 132* 132* 132*  K 5.7* 4.6 5.0  CL 88* 92* 93*  CO2 22 27 25   GLUCOSE 46* 110* 125*  BUN 56* 29* 46*  CREATININE 6.92* 4.11* 5.65*  CALCIUM 9.5 9.3 9.6  GFRNONAA 8* 15* 10*  GFRAA 9* 17* 12*   estimated creatinine clearance is 13.3 mL/min (A) (by C-G formula based on SCr of 5.65 mg/dL (H)).  CMP Latest Ref Rng & Units 12/24/2019 12/23/2019 12/22/2019  Glucose 70 - 99 mg/dL 125(H) 110(H) 46(L)  BUN 6 - 20 mg/dL 46(H) 29(H) 56(H)  Creatinine 0.61 - 1.24 mg/dL 5.65(H) 4.11(H) 6.92(H)  Sodium 135 - 145 mmol/L 132(L) 132(L) 132(L)  Potassium 3.5 - 5.1 mmol/L 5.0 4.6 5.7(H)  Chloride 98 - 111 mmol/L 93(L) 92(L) 88(L)  CO2 22 - 32 mmol/L 25 27 22   Calcium 8.9 - 10.3 mg/dL 9.6 9.3 9.5  Total Protein 6.5 - 8.1 g/dL 6.8 6.7 -  Total Bilirubin 0.3 - 1.2 mg/dL 0.6 0.8 -  Alkaline Phos 38 - 126 U/L  255(H) 243(H) -  AST 15 - 41 U/L 50(H) 86(H) -  ALT 0 - 44 U/L 38 43 -   CBC Latest Ref Rng & Units 12/24/2019 12/23/2019 12/22/2019  WBC 4.0 - 10.5 K/uL 11.3(H) 11.3(H) 12.7(H)  Hemoglobin 13.0 - 17.0 g/dL 9.0(L) 9.1(L) 10.0(L)  Hematocrit 39 - 52 % 26.6(L) 27.1(L) 29.6(L)  Platelets 150 - 400 K/uL 202 185 281   Lipid Panel     Component Value Date/Time   CHOL 103 08/17/2016 0134   TRIG 183 (H) 10/04/2019 1400   HDL 34 (L) 08/17/2016 0134   CHOLHDL 3.0 08/17/2016 0134   VLDL 19 08/17/2016 0134   LDLCALC 50 08/17/2016 0134   HEMOGLOBIN A1C Lab Results  Component Value Date   HGBA1C 4.7 (L) 09/26/2019   MPG 88.19 09/26/2019   TSH No results for input(s): TSH in the last 8760 hours.  Lipid Panel Recent Labs    10/04/19 1400  TRIG 183*    External labs:   03/11/2018: TSH 1.32 normal  Medications and allergies   Allergies  Allergen Reactions  . Tramadol Itching and Other (See Comments)  . Morphine And Related Other (See Comments)    Stomach pain  . Pollen Extract     Other reaction(s): Sneezing (finding)  . Acetaminophen Nausea Only    Stomach ache Other reaction(s): Other (See Comments) Stomach ache  . Aspirin Other (See Comments) and Itching  STOMACH PAIN Other reaction(s): Unknown STOMACH PAIN  . Clonidine Derivatives Itching     Current Outpatient Medications  Medication Instructions  . amiodarone (PACERONE) 200 mg, Oral, 2 times daily  . diltiazem (CARDIZEM CD) 240 MG 24 hr capsule TAKE 1 CAPSULE BY MOUTH EVERY DAY  . gabapentin (NEURONTIN) 100 mg, Oral, 3 times daily  . hydrALAZINE (APRESOLINE) 100 mg, Oral, 2 times daily  . hydrOXYzine (ATARAX/VISTARIL) 25 mg, Oral, 2 times daily PRN  . lactulose (CHRONULAC) 30 g, Oral, Daily PRN  . metoprolol tartrate (LOPRESSOR) 100 mg, Oral, 2 times daily  . ondansetron (ZOFRAN) 4 mg, Oral, Every 4 hours PRN  . pantoprazole (PROTONIX) 40 mg, Oral, Daily before breakfast  . sevelamer carbonate (RENVELA)  1,600-3,200 mg, Oral, See admin instructions, Take 4 tablets (3200 mg) by mouth 3 times daily with meals and 2 tablets (1600 mg) with snacks   Radiology:    Cardiac Studies:   Coronary Angiogram 02/22/2017: No significant disease on left, mid RCA high-grade stenosis status post 3.5 x 23 mm Xience [place] DES.  Carotid Doppler 10/28/2016: Stenosis in the right common carotid artery (<50%) with severe heteregenous plaque. Stenosis in the left internal carotid artery (16-49%). Severe heteregenous plaque in the left common carotid artery with < 50% stenosis. Mild stenosis in the left external carotid artery (<50%). Antegrade vertebral artery flow. Follow up in one year is appropriate if clinically indicated.  Lower Venous DVT Study 10/05/2019: BILATERAL: - No evidence of deep vein thrombosis seen in the lower extremities, bilaterally.  RIGHT & LEFT: - No cystic structure found in the popliteal fossa.   Echocardiogram 10/04/2019: 1. Low normal LV systolic function; severe LVH (myocardium with specked appearance; consider amyloid); mild to moderate AS (mean gradient 11 mmHg  and AVA 1.3 cm2); mild AI; thickened MV with mild to moderate MS (mean gradient 6 mmHg and MVA 1.2-2 cm2;  pt in atrial fibrillation at time of study); severe biatrial enlargement; mild RVE; severe RV dysfunction; severe TR with severe pulmonary hypertension.  2. Left ventricular ejection fraction, by estimation, is 50 to 55%. The left ventricle has low normal function. The left ventricle has no regional  wall motion abnormalities. There is severe left ventricular hypertrophy.  Left ventricular diastolic function could not be evaluated.  3. Right ventricular systolic function is severely reduced. The right ventricular size is mildly enlarged. There is severely elevated pulmonary artery systolic pressure.  4. Left atrial size was severely dilated.  5. Right atrial size was severely dilated.  6. Large pleural effusion in the  left lateral region.  7. The mitral valve is normal in structure. Mild mitral valve regurgitation. Mild to moderate mitral stenosis.  8. Tricuspid valve regurgitation is severe.  9. The aortic valve is normal in structure. Aortic valve regurgitation is mild. Mild to moderate aortic valve stenosis.  10. The inferior vena cava is dilated in size with <50% respiratory variability, suggesting right atrial pressure of 15 mmHg.  EKG  EKG 12/27/2019: Sinus rhythm with first-degree AV block at rate of 77 bpm, rare PACs, left bundle branch block.  No further analysis.  EKG 12/03/2019: Underlying possibly atypical atrial flutter with 2: 1 conduction, ventricular rate 92 bpm, left bundle branch block.  No further analysis.  PVC.   11/25/2019: Sinus tachycardia. Multiple ventricular premature complexes. Sinus pause. Nonspecific IVCD with LAD. LVH with secondary repolarization abnormality  08/16/2019: Sinus rhythm with first-degree AV block at rate of 87 bpm, left atrial enlargement, left bundle branch block.  No further analysis.    08/01/2019: Atrial fibrillation at 79 bpm, LBBB. No further analysis due to LBBB.  Assessment     ICD-10-CM   1. Paroxysmal atrial fibrillation ([place]). CHA2DS2-VASc Score is 2. Yearly risk of stroke: 2.3% (Hypertension and CHF)  I48.0 EKG 12-Lead  2. Cor pulmonale, chronic (HCC)  I27.81   3. Left bundle branch block  I44.7   4. Essential hypertension  I10   5. Polyneuropathy associated with underlying disease ([place])  G63 gabapentin (NEURONTIN) 100 MG capsule  6. Gastroesophageal reflux disease without esophagitis  K21.9 pantoprazole (PROTONIX) 40 MG tablet   .   Meds ordered this encounter  Medications  . gabapentin (NEURONTIN) 100 MG capsule    Sig: Take 1 capsule (100 mg total) by mouth 3 (three) times daily for 90 doses.    Dispense:  90 capsule    Refill:  0  . pantoprazole (PROTONIX) 40 MG tablet    Sig: Take 1 tablet (40 mg total) by mouth daily before breakfast.     Dispense:  30 tablet    Refill:  0    Medications Discontinued During This Encounter  Medication Reason  . lidocaine (LIDODERM) 5 % Ineffective  . lidocaine (LIDODERM) 5 % No longer needed (for PRN medications)  . Oxycodone HCl 10 MG TABS Patient Preference  . pantoprazole (PROTONIX) 40 MG tablet Reorder  . gabapentin (NEURONTIN) 100 MG capsule Reorder    Recommendations:   [name]  is a 59 y.o. male  with very complex medical problems including severe COPD and chronic cor pulmonale with pulmonary hypertension, coronary artery disease s/p stenting to mid RCA in 2018, paroxysmal atrial fibrillation with RVR, hypertensive cardiomyopathy, dietary and medication noncompliance, end-stage renal disease on hemodialysis, chronic hepatitis B and C, tobacco use, smokes 1/2 ppd, h/o marijuana use and prior cocaine use quit in 2017. Not on anticoagulation due to cirrhosis of liver and compliance with Warfarin.   He has had multiple ER visits for A fib with RVR, syncope, shortness of breath and hyperkalemia.  He has also been in the hospital with acute respiratory distress and pulmonary edema due to missed dialysis.  Recently seen in the emergency room for pulmonary edema and also ascites felt to be more of an exudative ascites along with elevated white count suggestive of peritonitis.  His clinical presentation of recurrent peritonitis is more related to right-sided heart failure with chronic cor pulmonale and elevated central venous pressure and severe pulmonary hypertension.  He is presently DNR at his request, he presently has advanced kidney disease, liver disease and hypertensive heart disease as well contributing to his comorbidity.  I have refilled the prescription for gabapentin as he states that he has not been able to be in touch with his PCP.  I also refilled his pantoprazole. OV in 6 weeks.   Adrian Prows, MD, [facility] 12/27/2019, 2:34 PM [facility]. [place] Pager:  [phone] Office: [phone]

## 2020-01-02 ENCOUNTER — Other Ambulatory Visit: Payer: Self-pay

## 2020-01-02 MED ORDER — HYDROXYZINE HCL 25 MG PO TABS: 25.0000 mg | ORAL_TABLET | Freq: Two times a day (BID) | ORAL | 2 refills | Status: DC | PRN

## 2020-01-03 ENCOUNTER — Ambulatory Visit (HOSPITAL_COMMUNITY)
Admission: RE | Admit: 2020-01-03 | Discharge: 2020-01-03 | Disposition: A | Discharge status: Home / Self Care | Payer: Medicare Other | Source: Ambulatory Visit | Attending: Gastroenterology | Admitting: Gastroenterology

## 2020-01-03 ENCOUNTER — Other Ambulatory Visit (HOSPITAL_COMMUNITY): Payer: Self-pay | Admitting: Gastroenterology

## 2020-01-03 DIAGNOSIS — K7460 Unspecified cirrhosis of liver: Secondary | ICD-10-CM

## 2020-01-03 DIAGNOSIS — R188 Other ascites: Secondary | ICD-10-CM | POA: Diagnosis not present

## 2020-01-03 HISTORY — PX: IR PARACENTESIS: IMG2679

## 2020-01-03 LAB — BODY FLUID CELL COUNT WITH DIFFERENTIAL
Eos, Fluid: 0 %
Lymphs, Fluid: 34 %
Monocyte-Macrophage-Serous Fluid: 62 % (ref 50–90)
Neutrophil Count, Fluid: 3 % (ref 0–25)
Total Nucleated Cell Count, Fluid: 290 cu mm (ref 0–1000)

## 2020-01-03 MED ORDER — LIDOCAINE HCL (PF) 1 % IJ SOLN
INTRAMUSCULAR | Status: DC | PRN
  Administered 2020-01-03: 15 mL

## 2020-01-03 MED ORDER — LIDOCAINE HCL 1 % IJ SOLN
INTRAMUSCULAR | Status: AC
  Filled 2020-01-03: qty 20

## 2020-01-03 NOTE — Procedures (Signed)
PROCEDURE SUMMARY:  Successful US guided paracentesis from left lateral abdomen.  Yielded 2.9 liters of red fluid.  No immediate complications.  Patient tolerated well.  EBL = trace  Specimen was sent for labs.  Judie Grieve Rykar Lebleu PA-C 01/03/2020 2:34 PM

## 2020-01-05 LAB — PATHOLOGIST SMEAR REVIEW

## 2020-01-10 ENCOUNTER — Ambulatory Visit (HOSPITAL_COMMUNITY)
Admission: RE | Admit: 2020-01-10 | Discharge: 2020-01-10 | Disposition: A | Discharge status: Home / Self Care | Payer: Medicare Other | Source: Ambulatory Visit | Attending: Gastroenterology | Admitting: Gastroenterology

## 2020-01-10 ENCOUNTER — Other Ambulatory Visit: Payer: Self-pay

## 2020-01-10 DIAGNOSIS — R188 Other ascites: Secondary | ICD-10-CM | POA: Insufficient documentation

## 2020-01-10 DIAGNOSIS — K7460 Unspecified cirrhosis of liver: Secondary | ICD-10-CM | POA: Diagnosis not present

## 2020-01-10 HISTORY — PX: IR PARACENTESIS: IMG2679

## 2020-01-10 MED ORDER — LIDOCAINE HCL 1 % IJ SOLN
INTRAMUSCULAR | Status: AC
  Filled 2020-01-10: qty 20

## 2020-01-10 NOTE — Procedures (Signed)
PROCEDURE SUMMARY:  Successful US guided paracentesis from left lateral abdomen.  Yielded 3L of light pink fluid.  No immediate complications.  Pt tolerated well.   EBL < 34mL  Ira Dougher R Erleen Egner, NP 01/10/2020 5:16 PM

## 2020-01-13 ENCOUNTER — Other Ambulatory Visit: Payer: Self-pay

## 2020-01-13 ENCOUNTER — Encounter (HOSPITAL_COMMUNITY): Payer: Self-pay | Admitting: Internal Medicine

## 2020-01-13 ENCOUNTER — Observation Stay (HOSPITAL_COMMUNITY)
Admission: EM | Admit: 2020-01-13 | Discharge: 2020-01-14 | Disposition: A | Discharge status: Home Health | Payer: Medicare Other | Attending: Internal Medicine | Admitting: Internal Medicine

## 2020-01-13 ENCOUNTER — Emergency Department (HOSPITAL_COMMUNITY): Payer: Medicare Other

## 2020-01-13 DIAGNOSIS — E46 Unspecified protein-calorie malnutrition: Secondary | ICD-10-CM | POA: Insufficient documentation

## 2020-01-13 DIAGNOSIS — E875 Hyperkalemia: Secondary | ICD-10-CM | POA: Diagnosis present

## 2020-01-13 DIAGNOSIS — Z886 Allergy status to analgesic agent status: Secondary | ICD-10-CM | POA: Insufficient documentation

## 2020-01-13 DIAGNOSIS — I2510 Atherosclerotic heart disease of native coronary artery without angina pectoris: Secondary | ICD-10-CM | POA: Insufficient documentation

## 2020-01-13 DIAGNOSIS — I313 Pericardial effusion (noninflammatory): Secondary | ICD-10-CM | POA: Insufficient documentation

## 2020-01-13 DIAGNOSIS — Z955 Presence of coronary angioplasty implant and graft: Secondary | ICD-10-CM | POA: Insufficient documentation

## 2020-01-13 DIAGNOSIS — Z66 Do not resuscitate: Secondary | ICD-10-CM | POA: Insufficient documentation

## 2020-01-13 DIAGNOSIS — J441 Chronic obstructive pulmonary disease with (acute) exacerbation: Secondary | ICD-10-CM | POA: Insufficient documentation

## 2020-01-13 DIAGNOSIS — I480 Paroxysmal atrial fibrillation: Secondary | ICD-10-CM | POA: Diagnosis not present

## 2020-01-13 DIAGNOSIS — K7031 Alcoholic cirrhosis of liver with ascites: Secondary | ICD-10-CM | POA: Insufficient documentation

## 2020-01-13 DIAGNOSIS — I447 Left bundle-branch block, unspecified: Secondary | ICD-10-CM | POA: Diagnosis not present

## 2020-01-13 DIAGNOSIS — F17210 Nicotine dependence, cigarettes, uncomplicated: Secondary | ICD-10-CM | POA: Diagnosis not present

## 2020-01-13 DIAGNOSIS — Z79899 Other long term (current) drug therapy: Secondary | ICD-10-CM | POA: Diagnosis not present

## 2020-01-13 DIAGNOSIS — F419 Anxiety disorder, unspecified: Secondary | ICD-10-CM | POA: Insufficient documentation

## 2020-01-13 DIAGNOSIS — Z885 Allergy status to narcotic agent status: Secondary | ICD-10-CM | POA: Diagnosis not present

## 2020-01-13 DIAGNOSIS — I252 Old myocardial infarction: Secondary | ICD-10-CM | POA: Insufficient documentation

## 2020-01-13 DIAGNOSIS — I509 Heart failure, unspecified: Secondary | ICD-10-CM | POA: Insufficient documentation

## 2020-01-13 DIAGNOSIS — N186 End stage renal disease: Secondary | ICD-10-CM | POA: Insufficient documentation

## 2020-01-13 DIAGNOSIS — D631 Anemia in chronic kidney disease: Secondary | ICD-10-CM | POA: Insufficient documentation

## 2020-01-13 DIAGNOSIS — K219 Gastro-esophageal reflux disease without esophagitis: Secondary | ICD-10-CM | POA: Insufficient documentation

## 2020-01-13 DIAGNOSIS — Z20822 Contact with and (suspected) exposure to covid-19: Secondary | ICD-10-CM | POA: Insufficient documentation

## 2020-01-13 DIAGNOSIS — J9601 Acute respiratory failure with hypoxia: Principal | ICD-10-CM | POA: Insufficient documentation

## 2020-01-13 DIAGNOSIS — M19012 Primary osteoarthritis, left shoulder: Secondary | ICD-10-CM | POA: Insufficient documentation

## 2020-01-13 DIAGNOSIS — I700 Atherosclerosis of aorta: Secondary | ICD-10-CM | POA: Insufficient documentation

## 2020-01-13 DIAGNOSIS — N2581 Secondary hyperparathyroidism of renal origin: Secondary | ICD-10-CM | POA: Diagnosis not present

## 2020-01-13 DIAGNOSIS — Z888 Allergy status to other drugs, medicaments and biological substances status: Secondary | ICD-10-CM | POA: Diagnosis not present

## 2020-01-13 DIAGNOSIS — Z992 Dependence on renal dialysis: Secondary | ICD-10-CM | POA: Diagnosis not present

## 2020-01-13 DIAGNOSIS — I272 Pulmonary hypertension, unspecified: Secondary | ICD-10-CM | POA: Insufficient documentation

## 2020-01-13 DIAGNOSIS — R001 Bradycardia, unspecified: Secondary | ICD-10-CM

## 2020-01-13 DIAGNOSIS — Z8249 Family history of ischemic heart disease and other diseases of the circulatory system: Secondary | ICD-10-CM | POA: Insufficient documentation

## 2020-01-13 DIAGNOSIS — I132 Hypertensive heart and chronic kidney disease with heart failure and with stage 5 chronic kidney disease, or end stage renal disease: Secondary | ICD-10-CM | POA: Insufficient documentation

## 2020-01-13 DIAGNOSIS — J90 Pleural effusion, not elsewhere classified: Secondary | ICD-10-CM | POA: Diagnosis not present

## 2020-01-13 DIAGNOSIS — B181 Chronic viral hepatitis B without delta-agent: Secondary | ICD-10-CM | POA: Diagnosis not present

## 2020-01-13 DIAGNOSIS — B182 Chronic viral hepatitis C: Secondary | ICD-10-CM | POA: Diagnosis not present

## 2020-01-13 LAB — DIFFERENTIAL
Abs Immature Granulocytes: 0.09 10*3/uL — ABNORMAL HIGH (ref 0.00–0.07)
Basophils Absolute: 0.2 10*3/uL — ABNORMAL HIGH (ref 0.0–0.1)
Basophils Relative: 2 %
Eosinophils Absolute: 0.3 10*3/uL (ref 0.0–0.5)
Eosinophils Relative: 2 %
Immature Granulocytes: 1 %
Lymphocytes Relative: 11 %
Lymphs Abs: 1.4 10*3/uL (ref 0.7–4.0)
Monocytes Absolute: 0.8 10*3/uL (ref 0.1–1.0)
Monocytes Relative: 6 %
Neutro Abs: 10.1 10*3/uL — ABNORMAL HIGH (ref 1.7–7.7)
Neutrophils Relative %: 78 %

## 2020-01-13 LAB — CBC
HCT: 35.5 % — ABNORMAL LOW (ref 39.0–52.0)
Hemoglobin: 12 g/dL — ABNORMAL LOW (ref 13.0–17.0)
MCH: 27.6 pg (ref 26.0–34.0)
MCHC: 33.8 g/dL (ref 30.0–36.0)
MCV: 81.6 fL (ref 80.0–100.0)
Platelets: 283 10*3/uL (ref 150–400)
RBC: 4.35 MIL/uL (ref 4.22–5.81)
RDW: 15.2 % (ref 11.5–15.5)
WBC: 12.8 10*3/uL — ABNORMAL HIGH (ref 4.0–10.5)
nRBC: 0 % (ref 0.0–0.2)

## 2020-01-13 LAB — COMPREHENSIVE METABOLIC PANEL
ALT: 39 U/L (ref 0–44)
AST: 54 U/L — ABNORMAL HIGH (ref 15–41)
Albumin: 2.6 g/dL — ABNORMAL LOW (ref 3.5–5.0)
Alkaline Phosphatase: 314 U/L — ABNORMAL HIGH (ref 38–126)
Anion gap: 16 — ABNORMAL HIGH (ref 5–15)
BUN: 38 mg/dL — ABNORMAL HIGH (ref 6–20)
CO2: 21 mmol/L — ABNORMAL LOW (ref 22–32)
Calcium: 9.4 mg/dL (ref 8.9–10.3)
Chloride: 90 mmol/L — ABNORMAL LOW (ref 98–111)
Creatinine, Ser: 5.77 mg/dL — ABNORMAL HIGH (ref 0.61–1.24)
GFR calc Af Amer: 12 mL/min — ABNORMAL LOW (ref >60)
GFR calc non Af Amer: 10 mL/min — ABNORMAL LOW (ref >60)
Glucose, Bld: 118 mg/dL — ABNORMAL HIGH (ref 70–99)
Potassium: 7.2 mmol/L (ref 3.5–5.1)
Sodium: 127 mmol/L — ABNORMAL LOW (ref 135–145)
Total Bilirubin: 1.1 mg/dL (ref 0.3–1.2)
Total Protein: 6.8 g/dL (ref 6.5–8.1)

## 2020-01-13 LAB — PROTIME-INR
INR: 1.2 (ref 0.8–1.2)
Prothrombin Time: 14.8 seconds (ref 11.4–15.2)

## 2020-01-13 LAB — MAGNESIUM: Magnesium: 2.2 mg/dL (ref 1.7–2.4)

## 2020-01-13 LAB — I-STAT CHEM 8, ED
BUN: 38 mg/dL — ABNORMAL HIGH (ref 6–20)
Calcium, Ion: 1.06 mmol/L — ABNORMAL LOW (ref 1.15–1.40)
Chloride: 93 mmol/L — ABNORMAL LOW (ref 98–111)
Creatinine, Ser: 6.4 mg/dL — ABNORMAL HIGH (ref 0.61–1.24)
Glucose, Bld: 122 mg/dL — ABNORMAL HIGH (ref 70–99)
HCT: 38 % — ABNORMAL LOW (ref 39.0–52.0)
Hemoglobin: 12.9 g/dL — ABNORMAL LOW (ref 13.0–17.0)
Potassium: 6.6 mmol/L (ref 3.5–5.1)
Sodium: 124 mmol/L — ABNORMAL LOW (ref 135–145)
TCO2: 23 mmol/L (ref 22–32)

## 2020-01-13 LAB — LIPASE, BLOOD: Lipase: 27 U/L (ref 11–51)

## 2020-01-13 LAB — CBG MONITORING, ED
Glucose-Capillary: 139 mg/dL — ABNORMAL HIGH (ref 70–99)
Glucose-Capillary: 64 mg/dL — ABNORMAL LOW (ref 70–99)

## 2020-01-13 LAB — APTT: aPTT: 36 seconds (ref 24–36)

## 2020-01-13 LAB — SARS CORONAVIRUS 2 BY RT PCR (HOSPITAL ORDER, PERFORMED IN ~~LOC~~ HOSPITAL LAB): SARS Coronavirus 2: NEGATIVE

## 2020-01-13 MED ORDER — HEPARIN SODIUM (PORCINE) 5000 UNIT/ML IJ SOLN: 5000.0000 [IU] | Freq: Three times a day (TID) | INTRAMUSCULAR | Status: DC

## 2020-01-13 MED ORDER — DOCUSATE SODIUM 100 MG PO CAPS: 100.0000 mg | ORAL_CAPSULE | Freq: Two times a day (BID) | ORAL | Status: DC | PRN

## 2020-01-13 MED ORDER — PANTOPRAZOLE SODIUM 40 MG PO TBEC
40.0000 mg | DELAYED_RELEASE_TABLET | Freq: Every day | ORAL | Status: DC
  Administered 2020-01-14: 40 mg via ORAL
  Filled 2020-01-13: qty 1

## 2020-01-13 MED ORDER — POLYETHYLENE GLYCOL 3350 17 G PO PACK: 17.0000 g | PACK | Freq: Every day | ORAL | Status: DC | PRN

## 2020-01-13 MED ORDER — FENTANYL CITRATE (PF) 100 MCG/2ML IJ SOLN
INTRAMUSCULAR | Status: AC | PRN
  Administered 2020-01-13: 25 ug via INTRAVENOUS

## 2020-01-13 MED ORDER — FENTANYL CITRATE (PF) 100 MCG/2ML IJ SOLN
INTRAMUSCULAR | Status: AC
  Filled 2020-01-13: qty 2

## 2020-01-13 MED ORDER — CHLORHEXIDINE GLUCONATE CLOTH 2 % EX PADS
6.0000 | MEDICATED_PAD | Freq: Every day | CUTANEOUS | Status: DC
  Administered 2020-01-14: 6 via TOPICAL

## 2020-01-13 MED ORDER — DEXTROSE 50 % IV SOLN
1.0000 | Freq: Once | INTRAVENOUS | Status: AC
  Administered 2020-01-13: 50 mL via INTRAVENOUS

## 2020-01-13 MED ORDER — SODIUM CHLORIDE 0.9% FLUSH: 3.0000 mL | Freq: Once | INTRAVENOUS | Status: DC

## 2020-01-13 MED ORDER — ATROPINE SULFATE 1 MG/ML IJ SOLN
INTRAMUSCULAR | Status: AC | PRN
  Administered 2020-01-13: 1 mg via INTRAVENOUS

## 2020-01-13 MED ORDER — DEXTROSE 50 % IV SOLN
50.0000 mL | Freq: Once | INTRAVENOUS | Status: AC
  Administered 2020-01-13: 50 mL via INTRAVENOUS
  Filled 2020-01-13: qty 50

## 2020-01-13 MED ORDER — ALBUTEROL SULFATE (2.5 MG/3ML) 0.083% IN NEBU
INHALATION_SOLUTION | RESPIRATORY_TRACT | Status: AC
  Administered 2020-01-13: 10 mg
  Filled 2020-01-13: qty 12

## 2020-01-13 MED ORDER — INSULIN ASPART 100 UNIT/ML IV SOLN
5.0000 [IU] | Freq: Once | INTRAVENOUS | Status: AC
  Administered 2020-01-13: 5 [IU] via INTRAVENOUS

## 2020-01-13 MED ORDER — CALCIUM GLUCONATE-NACL 1-0.675 GM/50ML-% IV SOLN
1.0000 g | Freq: Once | INTRAVENOUS | Status: AC
  Administered 2020-01-13: 1000 mg via INTRAVENOUS

## 2020-01-13 MED ORDER — SODIUM CHLORIDE 0.9 % IV SOLN
2.0000 g | INTRAVENOUS | Status: DC
  Administered 2020-01-13: 2 g via INTRAVENOUS
  Filled 2020-01-13: qty 20

## 2020-01-13 MED ORDER — SODIUM BICARBONATE 8.4 % IV SOLN
INTRAVENOUS | Status: AC | PRN
  Administered 2020-01-13: 100 meq via INTRAVENOUS

## 2020-01-13 MED ORDER — ALBUTEROL SULFATE (2.5 MG/3ML) 0.083% IN NEBU
5.0000 mg | INHALATION_SOLUTION | Freq: Once | RESPIRATORY_TRACT | Status: AC
  Administered 2020-01-13: 5 mg via RESPIRATORY_TRACT

## 2020-01-13 NOTE — Progress Notes (Signed)
eLink Physician-Brief Progress Note Patient Name: [name] DOB: Nov 17, 1960 MRN: [id number]   Date of Service  01/13/2020  HPI/Events of Note  New patient evaluation. 26M with ESRD on iHD, cirrhosis, pAFib who presented with bradycardia to 20-30s and abdominal pain. Labs notable for K 7.2. Temporizing therapy for his hyperkalemia improved his bradycardia. He then underwent dialysis. Ceftriaxone was given to cover for possibility of SBP (known cirrhosis). There was no accessible peritoneal fluid for paracentesis per report.  Currently, patient is in no distress. He is eating dinner. HR 85 bpm. BP 128/96. RR 16, SpO2 91% on 2L [place].   eICU Interventions  Bradycardia (presumed secondary to hyperkalemia) has resolved with dialysis. Continue to monitor.  Recheck electrolytes in AM.  Wean [place] as tolerated. Consider need for further dialysis sessions prior to discharge if remains O2-dependent.  Continue empiric treatment of SBP for now with ceftriaxone, although this diagnosis remains uncertain. F/u cultures.     Intervention Category Evaluation Type: New Patient Evaluation  Charlott Rakes 01/13/2020, 11:48 PM

## 2020-01-13 NOTE — ED Notes (Signed)
Report given to rn on the dialysis unit

## 2020-01-13 NOTE — ED Notes (Signed)
To dialysis

## 2020-01-13 NOTE — ED Notes (Signed)
rocephjine not started blood cultures need to be drawn

## 2020-01-13 NOTE — ED Notes (Signed)
Dialysis has called for the pt  Will send for him

## 2020-01-13 NOTE — ED Notes (Signed)
Dr. Jonnie Finner paged to [phone] Dr. Reather Converse paged by Levada Dy

## 2020-01-13 NOTE — ED Notes (Signed)
Dr Darl Householder gave order for 1 amp of dextrose for low cbg

## 2020-01-13 NOTE — ED Notes (Signed)
Dr Jonnie Finner seeing pt

## 2020-01-13 NOTE — ED Notes (Signed)
Report called to the rn on 22m to dialysis

## 2020-01-13 NOTE — ED Provider Notes (Signed)
[facility] EMERGENCY DEPARTMENT Provider Note   CSN: [id number] Arrival date & time: 01/13/20  1536     History Chief Complaint  Patient presents with  . Dizziness  . Abdominal Pain  . Bradycardia    [name] is a 59 y.o. male w PMHx significant for ESRD on HD (last session 2 days ago), COPD, CAD s/p MI/stent, Hep B/CC, Cirrhosis s/p paracentesis 3 days ago who presents with abdominal pain and dizziness. Pt found to be bradycardic to the 20s in triage. Pt denies fever, URI symptoms, CP, SOB, LEE, Nausea/vomiting. Pt states he completed full course of dialysis on Friday wo complication. Pt also underwent paracentesis for ascites on Thursday of last week without complication.   HPI     Past Medical History:  Diagnosis Date  . Anemia   . Anxiety   . Arthritis    left shoulder  . Atherosclerosis of aorta ([place])   . Cardiomegaly   . Chest pain    DATE UNKNOWN, C/O PERIODICALLY  . Cocaine abuse ([place])   . COPD exacerbation ([place]) 08/17/2016  . Coronary artery disease    stent 02/22/17  . ESRD (end stage renal disease) on dialysis ([place])    "[name]; MWF" (07/04/2017)  . GERD (gastroesophageal reflux disease)    DATE UNKNOWN  . Hemorrhoids   . Hepatitis B, chronic ([place])   . Hepatitis C   . History of kidney stones   . Hyperkalemia   . Hypertension   . Kidney failure   . Metabolic bone disease    Patient denies  . Mitral stenosis   . Myocardial infarction ([place])   . Pneumonia   . Pulmonary edema   . Solitary rectal ulcer syndrome 07/2017   at flex sig for rectal bleeding  . Tubular adenoma of colon     Patient Active Problem List   Diagnosis Date Noted  . Acute pulmonary edema ([place]) 12/21/2019  . Acetaminophen overdose 10/27/2019  . ESRD (end stage renal disease) ([place])   . Goals of care, counseling/discussion   . Hypoxia 10/04/2019  . Advanced care planning/counseling discussion   . Renal failure 09/26/2019  . Dyspnea 06/09/2019    . Acquired thrombophilia ([place]) 06/05/2019  . A-fib ([place]) 05/30/2019  . Atrial fibrillation with RVR ([place]) 05/29/2019  . Melena   . Pressure injury of skin 03/09/2019  . Abdominal distention   . Volume overload 12/28/2018  . Sepsis ([place]) 09/12/2018  . Atherosclerosis of native coronary artery of native heart without angina pectoris 03/11/2018  . Benign neoplasm of cecum   . Benign neoplasm of ascending colon   . Benign neoplasm of descending colon   . Benign neoplasm of rectum   . Paroxysmal atrial fibrillation ([place]) 01/23/2018  . Hx of colonic polyps 01/20/2018  . End-stage renal disease on hemodialysis ([place]) 11/21/2017  . GERD (gastroesophageal reflux disease) 11/16/2017  . Decompensated hepatic cirrhosis ([place]) 11/15/2017  . DNR (do not resuscitate)   . Palliative care by specialist   . Hyponatremia 11/04/2017  . SBP (spontaneous bacterial peritonitis) ([place]) 10/30/2017  . Liver disease, chronic 10/30/2017  . SOB (shortness of breath)   . Abdominal pain 10/28/2017  . Upper airway cough syndrome with flattening on f/v loop 10/13/17 c/w vcd 10/17/2017  . Elevated diaphragm 10/13/2017  . Ileus ([place]) 09/29/2017  . QT prolongation 09/29/2017  . Malnutrition of moderate degree 09/29/2017  . Sinus congestion 09/03/2017  . Symptomatic anemia 09/02/2017  . Other cirrhosis of liver (  [place]) 09/02/2017  . Left bundle branch block 09/02/2017  . Mitral stenosis 09/02/2017  . Hematochezia 07/15/2017  . Wide-complex tachycardia ([place])   . Endotracheally intubated   . ESRD on dialysis ([place]) 07/04/2017  . Acute respiratory failure with hypoxia ([place]) 06/18/2017  . CKD (chronic kidney disease) stage V requiring chronic dialysis ([place]) 06/18/2017  . History of Cocaine abuse ([place]) 06/18/2017  . Hypertension 06/18/2017  . Infection of AV graft for dialysis ([place]) 06/18/2017  . Anxiety 06/18/2017  . Anemia due to chronic kidney disease 06/18/2017  . Atypical atrial flutter ([place]) 06/18/2017  .  Personality disorder ([place]) 06/13/2017  . Cellulitis 06/12/2017  . Adjustment disorder with mixed anxiety and depressed mood 06/10/2017  . Suicidal ideation 06/10/2017  . Arm wound, left, sequela 06/10/2017  . Dyspnea on exertion 05/29/2017  . Tachycardia 05/29/2017  . Hyperkalemia 05/22/2017  . Acute metabolic encephalopathy   . Anemia 04/23/2017  . Ascites 04/23/2017  . COPD (chronic obstructive pulmonary disease) ([place]) 04/23/2017  . Acute on chronic respiratory failure with hypoxia ([place]) 03/25/2017  . Arrhythmia 03/25/2017  . COPD GOLD 0 with flattening on inps f/v  09/27/2016  . Essential hypertension 09/27/2016  . Fluid overload 08/30/2016  . COPD exacerbation ([place]) 08/17/2016  . Hypertensive urgency 08/17/2016  . Respiratory failure ([place]) 08/17/2016  . Problem with dialysis access ([place]) 07/23/2016  . Chronic hepatitis B ([place]) 03/05/2014  . Chronic hepatitis C without hepatic coma ([place]) 03/05/2014  . Internal hemorrhoids with bleeding, swelling and itching 03/05/2014  . Thrombocytopenia ([place]) 03/05/2014  . Chest pain 02/27/2014  . Alcohol abuse 04/14/2009  . Cigarette smoker 04/14/2009  . GANGLION CYST 04/14/2009    Past Surgical History:  Procedure Laterality Date  . A/V FISTULAGRAM Left 05/26/2017   Procedure: A/V FISTULAGRAM;  Surgeon: Conrad Esbon, MD;  Location: [facility];  Service: Cardiovascular;  Laterality: Left;  . A/V FISTULAGRAM Right 11/18/2017   Procedure: A/V FISTULAGRAM - Right Arm;  Surgeon: Elam Dutch, MD;  Location: [facility];  Service: Cardiovascular;  Laterality: Right;  . APPLICATION OF WOUND VAC Left 06/14/2017   Procedure: APPLICATION OF WOUND VAC;  Surgeon: Katha Cabal, MD;  Location: [facility];  Service: Vascular;  Laterality: Left;  . AV FISTULA PLACEMENT  2012   BELIEVED WAS PLACED IN JUNE  . AV FISTULA PLACEMENT Right 08/09/2017   Procedure: Creation Right arm ARTERIOVENOUS BRACHIOCEPOHALIC FISTULA;  Surgeon:  Elam Dutch, MD;  Location: [facility] OR;  Service: Vascular;  Laterality: Right;  . AV FISTULA PLACEMENT Right 11/22/2017   Procedure: INSERTION OF ARTERIOVENOUS (AV) GORE-TEX GRAFT RIGHT UPPER ARM;  Surgeon: Elam Dutch, MD;  Location: [facility];  Service: Vascular;  Laterality: Right;  . BIOPSY  01/25/2018   Procedure: BIOPSY;  Surgeon: Jerene Bears, MD;  Location: [facility];  Service: Gastroenterology;;  . BIOPSY  04/10/2019   Procedure: BIOPSY;  Surgeon: Jerene Bears, MD;  Location: [facility];  Service: Gastroenterology;;  . COLONOSCOPY    . COLONOSCOPY WITH PROPOFOL N/A 01/25/2018   Procedure: COLONOSCOPY WITH PROPOFOL;  Surgeon: Jerene Bears, MD;  Location: [facility];  Service: Gastroenterology;  Laterality: N/A;  . CORONARY STENT INTERVENTION N/A 02/22/2017   Procedure: CORONARY STENT INTERVENTION;  Surgeon: Nigel Mormon, MD;  Location: [facility];  Service: Cardiovascular;  Laterality: N/A;  . ESOPHAGOGASTRODUODENOSCOPY (EGD) WITH PROPOFOL N/A 01/25/2018   Procedure: ESOPHAGOGASTRODUODENOSCOPY (EGD) WITH PROPOFOL;  Surgeon: Jerene Bears, MD;  Location: [facility];  Service: Gastroenterology;  Laterality: N/A;  . ESOPHAGOGASTRODUODENOSCOPY (EGD) WITH PROPOFOL N/A 04/10/2019   Procedure: ESOPHAGOGASTRODUODENOSCOPY (EGD) WITH PROPOFOL;  Surgeon: Jerene Bears, MD;  Location: [facility];  Service: Gastroenterology;  Laterality: N/A;  . FLEXIBLE SIGMOIDOSCOPY N/A 07/15/2017   Procedure: FLEXIBLE SIGMOIDOSCOPY;  Surgeon: Carol Ada, MD;  Location: [facility];  Service: Endoscopy;  Laterality: N/A;  . HEMORRHOID BANDING    . I & D EXTREMITY Left 06/01/2017   Procedure: IRRIGATION AND DEBRIDEMENT LEFT ARM HEMATOMA WITH LIGATION OF LEFT ARM AV FISTULA;  Surgeon: Elam Dutch, MD;  Location: [facility];  Service: Vascular;  Laterality: Left;  . I & D EXTREMITY Left 06/14/2017   Procedure: IRRIGATION AND DEBRIDEMENT EXTREMITY;  Surgeon: Katha Cabal, MD;   Location: [facility];  Service: Vascular;  Laterality: Left;  . INSERTION OF DIALYSIS CATHETER  05/30/2017  . INSERTION OF DIALYSIS CATHETER N/A 05/30/2017   Procedure: INSERTION OF DIALYSIS CATHETER;  Surgeon: Elam Dutch, MD;  Location: [facility];  Service: Vascular;  Laterality: N/A;  . IR PARACENTESIS  08/30/2017  . IR PARACENTESIS  09/29/2017  . IR PARACENTESIS  10/28/2017  . IR PARACENTESIS  11/09/2017  . IR PARACENTESIS  11/16/2017  . IR PARACENTESIS  11/28/2017  . IR PARACENTESIS  12/01/2017  . IR PARACENTESIS  12/06/2017  . IR PARACENTESIS  01/03/2018  . IR PARACENTESIS  01/23/2018  . IR PARACENTESIS  02/07/2018  . IR PARACENTESIS  02/21/2018  . IR PARACENTESIS  03/06/2018  . IR PARACENTESIS  03/17/2018  . IR PARACENTESIS  04/04/2018  . IR PARACENTESIS  12/28/2018  . IR PARACENTESIS  01/08/2019  . IR PARACENTESIS  01/23/2019  . IR PARACENTESIS  02/01/2019  . IR PARACENTESIS  02/19/2019  . IR PARACENTESIS  03/01/2019  . IR PARACENTESIS  03/15/2019  . IR PARACENTESIS  04/03/2019  . IR PARACENTESIS  04/12/2019  . IR PARACENTESIS  05/01/2019  . IR PARACENTESIS  05/08/2019  . IR PARACENTESIS  05/24/2019  . IR PARACENTESIS  06/12/2019  . IR PARACENTESIS  07/09/2019  . IR PARACENTESIS  07/27/2019  . IR PARACENTESIS  08/09/2019  . IR PARACENTESIS  08/21/2019  . IR PARACENTESIS  09/17/2019  . IR PARACENTESIS  10/05/2019  . IR PARACENTESIS  10/29/2019  . IR PARACENTESIS  11/08/2019  . IR PARACENTESIS  12/12/2019  . IR PARACENTESIS  01/03/2020  . IR PARACENTESIS  01/10/2020  . IR RADIOLOGIST EVAL & MGMT  02/14/2018  . IR RADIOLOGIST EVAL & MGMT  02/22/2019  . LEFT HEART CATH AND CORONARY ANGIOGRAPHY N/A 02/22/2017   Procedure: LEFT HEART CATH AND CORONARY ANGIOGRAPHY;  Surgeon: Nigel Mormon, MD;  Location: [facility];  Service: Cardiovascular;  Laterality: N/A;  . LIGATION OF ARTERIOVENOUS  FISTULA Left 07/12/9145   Procedure: Plication of Left Arm Arteriovenous Fistula;  Surgeon: Elam Dutch, MD;   Location: [facility];  Service: Vascular;  Laterality: Left;  . POLYPECTOMY    . POLYPECTOMY  01/25/2018   Procedure: POLYPECTOMY;  Surgeon: Jerene Bears, MD;  Location: [facility];  Service: Gastroenterology;;  . REVISON OF ARTERIOVENOUS FISTULA Left 03/09/5620   Procedure: PLICATION OF DISTAL ANEURYSMAL SEGEMENT OF LEFT UPPER ARM ARTERIOVENOUS FISTULA;  Surgeon: Elam Dutch, MD;  Location: [facility];  Service: Vascular;  Laterality: Left;  . REVISON OF ARTERIOVENOUS FISTULA Left 09/16/6576   Procedure: Plication of Left Upper Arm Fistula ;  Surgeon: Waynetta Sandy, MD;  Location: [facility];  Service: Vascular;  Laterality: Left;  . SKIN GRAFT SPLIT THICKNESS LEG / FOOT Left    SKIN GRAFT SPLIT THICKNESS LEFT ARM DONOR SITE: LEFT ANTERIOR THIGH  . SKIN SPLIT GRAFT Left 07/04/2017   Procedure: SKIN GRAFT SPLIT THICKNESS LEFT ARM DONOR SITE: LEFT ANTERIOR THIGH;  Surgeon: Elam Dutch, MD;  Location: [facility];  Service: Vascular;  Laterality: Left;  . THROMBECTOMY W/ EMBOLECTOMY Left 06/05/2017   Procedure: EXPLORATION OF LEFT ARM FOR BLEEDING; OVERSEWED PROXIMAL FISTULA;  Surgeon: Angelia Mould, MD;  Location: [facility];  Service: Vascular;  Laterality: Left;  . WOUND EXPLORATION Left 06/03/2017   Procedure: WOUND EXPLORATION WITH WOUND VAC APPLICATION TO LEFT ARM;  Surgeon: Angelia Mould, MD;  Location: [facility];  Service: Vascular;  Laterality: Left;       Family History  Problem Relation Age of Onset  . Heart disease Mother   . Lung cancer Mother   . Heart disease Father   . Malignant hyperthermia Father   . COPD Father   . Throat cancer Sister   . Esophageal cancer Sister   . Hypertension Other   . COPD Other   . Colon cancer Neg Hx   . Colon polyps Neg Hx   . Rectal cancer Neg Hx   . Stomach cancer Neg Hx     Social History   Tobacco Use  . Smoking status: Current Every Day Smoker    Packs/day: 0.50    Years: 43.00    Pack years: 21.50    Types:  Cigarettes    Start date: 08/13/1973  . Smokeless tobacco: Never Used  . Tobacco comment: i dont know i just make   Vaping Use  . Vaping Use: Never used  Substance Use Topics  . Alcohol use: Not Currently    Comment: quit drinking in 2017  . Drug use: Not Currently    Types: Marijuana, Cocaine    Comment: reports using once every 3 months, 04-06-2019 was this     Home Medications Prior to Admission medications   Medication Sig Start Date End Date Taking? Authorizing Provider  amiodarone (PACERONE) 200 MG tablet Take 1 tablet (200 mg total) by mouth 2 (two) times daily. 11/26/19 12/27/19  McDonald, Mia A, PA-C  diltiazem (CARDIZEM CD) 240 MG 24 hr capsule TAKE 1 CAPSULE BY MOUTH EVERY DAY Patient taking differently: Take 240 mg by mouth daily.  11/05/19   Adrian Prows, MD  gabapentin (NEURONTIN) 100 MG capsule Take 1 capsule (100 mg total) by mouth 3 (three) times daily for 90 doses. 12/27/19 01/26/20  Adrian Prows, MD  hydrALAZINE (APRESOLINE) 100 MG tablet Take 100 mg by mouth 2 (two) times daily.    [provider]  hydrOXYzine (ATARAX/VISTARIL) 25 MG tablet Take 1 tablet (25 mg total) by mouth 2 (two) times daily as needed for itching. 01/02/20   Adrian Prows, MD  lactulose (CHRONULAC) 10 GM/15ML solution Take 45 mLs (30 g total) by mouth daily as needed for mild constipation. 10/01/19   [name], MD  metoprolol tartrate (LOPRESSOR) 100 MG tablet Take 1 tablet (100 mg total) by mouth 2 (two) times daily. 06/04/19   Al Decant, MD  ondansetron (ZOFRAN) 4 MG tablet Take 4 mg by mouth every 4 (four) hours as needed for nausea or vomiting.  09/18/19   [provider]  pantoprazole (PROTONIX) 40 MG tablet Take 1 tablet (40 mg total) by mouth daily before breakfast. 12/27/19   Adrian Prows, MD  sevelamer carbonate (RENVELA) 800 MG tablet Take 1,600-3,200 mg by mouth See admin instructions. Take 4 tablets (3200 mg) by mouth 3 times daily with meals and 2 tablets (1600 mg) with snacks     [provider]    Allergies    Tramadol, Morphine and related, Pollen extract, Acetaminophen, Aspirin, and Clonidine derivatives  Review of Systems   Review of Systems  Constitutional: Negative for chills and fever.  HENT: Negative for congestion, rhinorrhea and sneezing.   Respiratory: Negative for cough and shortness of breath.   Cardiovascular: Negative for chest pain and leg swelling.  Gastrointestinal: Positive for abdominal distention and abdominal pain. Negative for nausea and vomiting.  Genitourinary: Negative for dysuria and hematuria.  Musculoskeletal: Negative for back pain.  Skin: Negative for rash and wound.  Neurological: Positive for dizziness. Negative for headaches.  Psychiatric/Behavioral: Negative.     Physical Exam Updated Vital Signs BP (P) 128/60 (BP Location: Left Arm)   Pulse (P) 84   Temp 97.8 F (36.6 C) (Oral)   Resp (P) 20   SpO2 99%   Physical Exam Vitals and nursing note reviewed.  Constitutional:      General: He is in acute distress.     Appearance: He is ill-appearing.  HENT:     Head: Normocephalic and atraumatic.     Nose: Nose normal.     Mouth/Throat:     Mouth: Mucous membranes are moist.  Eyes:     General: Scleral icterus present.     Extraocular Movements: Extraocular movements intact.  Cardiovascular:     Rate and Rhythm: Bradycardia present. Rhythm irregular.  Pulmonary:     Effort: Respiratory distress present.     Breath sounds: Wheezing present. No rales.  Abdominal:     General: Abdomen is protuberant. There is distension.     Palpations: There is fluid wave.     Tenderness: There is abdominal tenderness.     Hernia: No hernia is present.  Skin:    General: Skin is warm.     Findings: No rash.  Neurological:     Mental Status: He is alert and oriented to person, place, and time.     ED Results / Procedures / Treatments   Labs (all labs ordered are listed, but only abnormal results are  displayed) Labs Reviewed  CBC - Abnormal; Notable for the following components:      Result Value   WBC 12.8 (*)    Hemoglobin 12.0 (*)    HCT 35.5 (*)    All other components within normal limits  DIFFERENTIAL - Abnormal; Notable for the following components:   Neutro Abs 10.1 (*)    Basophils Absolute 0.2 (*)    Abs Immature Granulocytes 0.09 (*)    All other components within normal limits  COMPREHENSIVE METABOLIC PANEL - Abnormal; Notable for the following components:   Sodium 127 (*)    Potassium 7.2 (*)    Chloride 90 (*)    CO2 21 (*)    Glucose, Bld 118 (*)    BUN 38 (*)    Creatinine, Ser 5.77 (*)    Albumin 2.6 (*)    AST 54 (*)    Alkaline Phosphatase 314 (*)    GFR calc non Af Amer 10 (*)    GFR calc Af Amer 12 (*)    Anion gap 16 (*)    All other components within normal limits  I-STAT CHEM 8, ED - Abnormal; Notable for the following components:  Sodium 124 (*)    Potassium 6.6 (*)    Chloride 93 (*)    BUN 38 (*)    Creatinine, Ser 6.40 (*)    Glucose, Bld 122 (*)    Calcium, Ion 1.06 (*)    Hemoglobin 12.9 (*)    HCT 38.0 (*)    All other components within normal limits  CBG MONITORING, ED - Abnormal; Notable for the following components:   Glucose-Capillary 139 (*)    All other components within normal limits  CBG MONITORING, ED - Abnormal; Notable for the following components:   Glucose-Capillary 64 (*)    All other components within normal limits  SARS CORONAVIRUS 2 BY RT PCR (HOSPITAL ORDER, [facility])  CULTURE, BLOOD (ROUTINE X 2)  CULTURE, BLOOD (ROUTINE X 2)  PROTIME-INR  APTT  LIPASE, BLOOD  MAGNESIUM  CBC  BASIC METABOLIC PANEL  BLOOD GAS, ARTERIAL  MAGNESIUM  PHOSPHORUS    EKG None  Radiology DG Chest Portable 1 View  Result Date: 01/13/2020 CLINICAL DATA:  59 year old male with shortness of breath. EXAM: PORTABLE CHEST 1 VIEW COMPARISON:  Chest radiograph dated 12/21/2019 and CT dated 12/23/2019  FINDINGS: There is moderate enlargement of the cardiopericardial silhouette similar to the radiograph of 12/21/2019. There is central vascular prominence consistent with congestion. Trace bilateral pleural effusions noted. There are bibasilar atelectatic changes. No lobar consolidation or pneumothorax. Atherosclerotic calcification of the aorta and branches of the aortic arch. No acute osseous pathology. IMPRESSION: 1. Cardiomegaly with vascular congestion and possible mild edema. Overall slight interval worsening of the congestion since the radiograph of 12/21/2019. 2. Advanced atherosclerotic calcification of the aorta and branches of the aortic arch. Electronically Signed   By: Anner Crete M.D.   On: 01/13/2020 16:32    Procedures Procedures (including critical care time)  Medications Ordered in ED Medications  sodium chloride flush (NS) 0.9 % injection 3 mL (has no administration in time range)  fentaNYL (SUBLIMAZE) 100 MCG/2ML injection (has no administration in time range)  docusate sodium (COLACE) capsule 100 mg (has no administration in time range)  polyethylene glycol (MIRALAX / GLYCOLAX) packet 17 g (has no administration in time range)  cefTRIAXone (ROCEPHIN) 2 g in sodium chloride 0.9 % 100 mL IVPB (2 g Intravenous New Bag/Given 01/13/20 1910)  Chlorhexidine Gluconate Cloth 2 % PADS 6 each (has no administration in time range)  heparin injection 5,000 Units (has no administration in time range)  pantoprazole (PROTONIX) EC tablet 40 mg (has no administration in time range)  atropine injection (1 mg Intravenous Given 01/13/20 1557)  fentaNYL (SUBLIMAZE) injection (25 mcg Intravenous Given 01/13/20 1602)  calcium gluconate 1 g/ 50 mL sodium chloride IVPB (0 g Intravenous Stopped 01/13/20 1625)  insulin aspart (novoLOG) injection 5 Units (5 Units Intravenous Given 01/13/20 1613)    And  dextrose 50 % solution 50 mL (50 mLs Intravenous Given 01/13/20 1613)  albuterol (PROVENTIL) (2.5 MG/3ML)  0.083% nebulizer solution 5 mg (5 mg Nebulization Given 01/13/20 1612)  albuterol (PROVENTIL) (2.5 MG/3ML) 0.083% nebulizer solution (10 mg  Given 01/13/20 1612)  sodium bicarbonate injection (100 mEq Intravenous Given 01/13/20 1625)  dextrose 50 % solution 50 mL (50 mLs Intravenous Given 01/13/20 1904)    ED Course  I have reviewed the triage vital signs and the nursing notes.  Pertinent labs & imaging results that were available during my care of the patient were reviewed by me and considered in my medical decision making (see chart  for details).    MDM Rules/Calculators/A&P                          Pt is a 59 yo M w history significant for cirrhosis s/p paracentesis 3 days ago, ESRD on dialysis w last session 2 days ago who presented for abdominal pain, dizziness and general feeling of unwell. Pt found to be bradycardic to HR 20s in triage. Initial EKG concerning for significant bradycardia w concern for complete heart block. Pt w stable BP at that time (SBP 140s) and placed on 3L [place] with appropriate SpO2. Pads placed on patient chest. 1mg  atropine given wo improvement in HR. Initial PE significant for protuberant abdomen with generalized tenderness. Lungs w wheezing on auscultation throughout. No LEE. No rashes. Scleral icterus was present consistent with known cirrhosis. Due to concern for hyperkalemia and metabolic derangement leading to bradycardia Calcium gluconate given as well as D5, insulin and albuterol. Initial Labs concerning for hyperkalemia, confirmed on lab to be 7.2. Pt blood pressure remained stable throughout initial resuscitation. Nephrology was quickly consulted for emergent HD. Pt closely monitored. HR quickly improved following treatment to HR 100s. EKG repeated and wo concern for acute ischemia. EKG features initially concerning for heart block resolved. CXR wo concern for pna. Other initial labs reviewed and significant for leukocytosis to 12.8. Anion gap 16. COVID negative.    Patient quickly admitted to ICU level of care in the setting of hyperkalemia and need for emergent HD in ICU. Patient admitted.   Final Clinical Impression(s) / ED Diagnoses Final diagnoses:  None    Rx / DC Orders ED Discharge Orders    None       Kennyth Lose, MD 01/14/20 0001    Elnora Morrison, MD 01/14/20 [phone]

## 2020-01-13 NOTE — Consult Note (Addendum)
Renal Service Consult Note [facility]  Alexi Geibel [facility] 01/13/2020 [name] Requesting Physician:  Dr Reather Converse, [facility] ED.   Reason for Consult:  ESRD pt w/ bradycardia and hyperkalemia.  HPI: The patient is a 59 y.o. year-old w/ hx of chronic ascites, pHTN/ severe RV failure, HTN, hep B chronic, ESRD on HD, COPD, prior drug abuse presented to ED for abd pain and was found to have severe bradycardia w/ HR In the 20's.  BP's were okay 140/ 80. Recently has had LVP (large vol paracentesis) on 6.24 and on 7/1, about 2.5- 3 L each time were removed. Labs returned w/ K+ 7.2.  He is now getting acute temporizing measures for hyperkalemia.  Asked to see for acute HD.   Pt seen in ED.  Pt has rec'd IV Ca/ ins/ glucose/ bicarb and albuterol nebs. His HR is now in the 70's.  Pt denies any sig abd swelling or leg edema more than his usual.  No SOB or CP. On nasal O2 I believe at home as well (COPD).  No fever, no sig abd pain. Is thirsty. Last LVP was on 7/1.   Initial EKG w/ NSR 20 bpm at 3:46 pm 2nd EKG w/ NSR , LBBB (baseline), HR 65 at 4:32 pm  Recent admit June 2021 for resp failure and pulm edema, improved w/ HD. Also had SBP in setting of cirrhosis and chronic recurrent ascites, Rx'd w/ Rocephin. CX's were neg. Was to get 3 more doses of Fortaz at OP HD after dc per ID.  He had echo which showed severe pulm HTN and severe R HF/ LVH, LVEF was normal. Also had chronic afib on amio/ BB, no a/c.  Pt dc'd to home.   ROS  denies CP  no joint pain   no HA  no blurry vision  no rash  no diarrhea  no nausea/ vomiting    Past Medical History  Past Medical History:  Diagnosis Date  . Anemia   . Anxiety   . Arthritis    left shoulder  . Atherosclerosis of aorta ([place])   . Cardiomegaly   . Chest pain    DATE UNKNOWN, C/O PERIODICALLY  . Cocaine abuse ([place])   . COPD exacerbation ([place]) 08/17/2016  . Coronary artery disease    stent 02/22/17  . ESRD (end stage renal  disease) on dialysis ([place])    "[name]; MWF" (07/04/2017)  . GERD (gastroesophageal reflux disease)    DATE UNKNOWN  . Hemorrhoids   . Hepatitis B, chronic ([place])   . Hepatitis C   . History of kidney stones   . Hyperkalemia   . Hypertension   . Kidney failure   . Metabolic bone disease    Patient denies  . Mitral stenosis   . Myocardial infarction ([place])   . Pneumonia   . Pulmonary edema   . Solitary rectal ulcer syndrome 07/2017   at flex sig for rectal bleeding  . Tubular adenoma of colon    Past Surgical History  Past Surgical History:  Procedure Laterality Date  . A/V FISTULAGRAM Left 05/26/2017   Procedure: A/V FISTULAGRAM;  Surgeon: Conrad Gold Hill, MD;  Location: [place] [facility];  Service: Cardiovascular;  Laterality: Left;  . A/V FISTULAGRAM Right 11/18/2017   Procedure: A/V FISTULAGRAM - Right Arm;  Surgeon: Elam Dutch, MD;  Location: [place] [facility];  Service: Cardiovascular;  Laterality: Right;  . APPLICATION OF WOUND VAC Left 06/14/2017   Procedure:  APPLICATION OF WOUND VAC;  Surgeon: Delana Meyer Dolores Lory, MD;  Location: [facility];  Service: Vascular;  Laterality: Left;  . AV FISTULA PLACEMENT  2012   BELIEVED WAS PLACED IN JUNE  . AV FISTULA PLACEMENT Right 08/09/2017   Procedure: Creation Right arm ARTERIOVENOUS BRACHIOCEPOHALIC FISTULA;  Surgeon: Elam Dutch, MD;  Location: [facility];  Service: Vascular;  Laterality: Right;  . AV FISTULA PLACEMENT Right 11/22/2017   Procedure: INSERTION OF ARTERIOVENOUS (AV) GORE-TEX GRAFT RIGHT UPPER ARM;  Surgeon: Elam Dutch, MD;  Location: [place];  Service: Vascular;  Laterality: Right;  . BIOPSY  01/25/2018   Procedure: BIOPSY;  Surgeon: Jerene Bears, MD;  Location: [place];  Service: Gastroenterology;;  . BIOPSY  04/10/2019   Procedure: BIOPSY;  Surgeon: Jerene Bears, MD;  Location: [facility];  Service: Gastroenterology;;  . COLONOSCOPY    . COLONOSCOPY WITH PROPOFOL N/A 01/25/2018   Procedure:  COLONOSCOPY WITH PROPOFOL;  Surgeon: Jerene Bears, MD;  Location: [place];  Service: Gastroenterology;  Laterality: N/A;  . CORONARY STENT INTERVENTION N/A 02/22/2017   Procedure: CORONARY STENT INTERVENTION;  Surgeon: Nigel Mormon, MD;  Location: [place] [facility];  Service: Cardiovascular;  Laterality: N/A;  . ESOPHAGOGASTRODUODENOSCOPY (EGD) WITH PROPOFOL N/A 01/25/2018   Procedure: ESOPHAGOGASTRODUODENOSCOPY (EGD) WITH PROPOFOL;  Surgeon: Jerene Bears, MD;  Location: [place];  Service: Gastroenterology;  Laterality: N/A;  . ESOPHAGOGASTRODUODENOSCOPY (EGD) WITH PROPOFOL N/A 04/10/2019   Procedure: ESOPHAGOGASTRODUODENOSCOPY (EGD) WITH PROPOFOL;  Surgeon: Jerene Bears, MD;  Location: [facility];  Service: Gastroenterology;  Laterality: N/A;  . FLEXIBLE SIGMOIDOSCOPY N/A 07/15/2017   Procedure: FLEXIBLE SIGMOIDOSCOPY;  Surgeon: Carol Ada, MD;  Location: [place];  Service: Endoscopy;  Laterality: N/A;  . HEMORRHOID BANDING    . I & D EXTREMITY Left 06/01/2017   Procedure: IRRIGATION AND DEBRIDEMENT LEFT ARM HEMATOMA WITH LIGATION OF LEFT ARM AV FISTULA;  Surgeon: Elam Dutch, MD;  Location: [place];  Service: Vascular;  Laterality: Left;  . I & D EXTREMITY Left 06/14/2017   Procedure: IRRIGATION AND DEBRIDEMENT EXTREMITY;  Surgeon: Katha Cabal, MD;  Location: [facility];  Service: Vascular;  Laterality: Left;  . INSERTION OF DIALYSIS CATHETER  05/30/2017  . INSERTION OF DIALYSIS CATHETER N/A 05/30/2017   Procedure: INSERTION OF DIALYSIS CATHETER;  Surgeon: Elam Dutch, MD;  Location: [place];  Service: Vascular;  Laterality: N/A;  . IR PARACENTESIS  08/30/2017  . IR PARACENTESIS  09/29/2017  . IR PARACENTESIS  10/28/2017  . IR PARACENTESIS  11/09/2017  . IR PARACENTESIS  11/16/2017  . IR PARACENTESIS  11/28/2017  . IR PARACENTESIS  12/01/2017  . IR PARACENTESIS  12/06/2017  . IR PARACENTESIS  01/03/2018  . IR PARACENTESIS  01/23/2018  . IR PARACENTESIS   02/07/2018  . IR PARACENTESIS  02/21/2018  . IR PARACENTESIS  03/06/2018  . IR PARACENTESIS  03/17/2018  . IR PARACENTESIS  04/04/2018  . IR PARACENTESIS  12/28/2018  . IR PARACENTESIS  01/08/2019  . IR PARACENTESIS  01/23/2019  . IR PARACENTESIS  02/01/2019  . IR PARACENTESIS  02/19/2019  . IR PARACENTESIS  03/01/2019  . IR PARACENTESIS  03/15/2019  . IR PARACENTESIS  04/03/2019  . IR PARACENTESIS  04/12/2019  . IR PARACENTESIS  05/01/2019  . IR PARACENTESIS  05/08/2019  . IR PARACENTESIS  05/24/2019  . IR PARACENTESIS  06/12/2019  . IR PARACENTESIS  07/09/2019  . IR PARACENTESIS  07/27/2019  . IR PARACENTESIS  08/09/2019  . IR PARACENTESIS  08/21/2019  . IR PARACENTESIS  09/17/2019  . IR PARACENTESIS  10/05/2019  . IR PARACENTESIS  10/29/2019  . IR PARACENTESIS  11/08/2019  . IR PARACENTESIS  12/12/2019  . IR PARACENTESIS  01/03/2020  . IR PARACENTESIS  01/10/2020  . IR RADIOLOGIST EVAL & MGMT  02/14/2018  . IR RADIOLOGIST EVAL & MGMT  02/22/2019  . LEFT HEART CATH AND CORONARY ANGIOGRAPHY N/A 02/22/2017   Procedure: LEFT HEART CATH AND CORONARY ANGIOGRAPHY;  Surgeon: Nigel Mormon, MD;  Location: [place] [facility];  Service: Cardiovascular;  Laterality: N/A;  . LIGATION OF ARTERIOVENOUS  FISTULA Left 12/12/9526   Procedure: Plication of Left Arm Arteriovenous Fistula;  Surgeon: Elam Dutch, MD;  Location: [place];  Service: Vascular;  Laterality: Left;  . POLYPECTOMY    . POLYPECTOMY  01/25/2018   Procedure: POLYPECTOMY;  Surgeon: Jerene Bears, MD;  Location: [place];  Service: Gastroenterology;;  . REVISON OF ARTERIOVENOUS FISTULA Left 10/23/2438   Procedure: PLICATION OF DISTAL ANEURYSMAL SEGEMENT OF LEFT UPPER ARM ARTERIOVENOUS FISTULA;  Surgeon: Elam Dutch, MD;  Location: [place];  Service: Vascular;  Laterality: Left;  . REVISON OF ARTERIOVENOUS FISTULA Left 07/14/7251   Procedure: Plication of Left Upper Arm Fistula ;  Surgeon: Waynetta Sandy, MD;  Location: [place];  Service:  Vascular;  Laterality: Left;  . SKIN GRAFT SPLIT THICKNESS LEG / FOOT Left    SKIN GRAFT SPLIT THICKNESS LEFT ARM DONOR SITE: LEFT ANTERIOR THIGH  . SKIN SPLIT GRAFT Left 07/04/2017   Procedure: SKIN GRAFT SPLIT THICKNESS LEFT ARM DONOR SITE: LEFT ANTERIOR THIGH;  Surgeon: Elam Dutch, MD;  Location: [name];  Service: Vascular;  Laterality: Left;  . THROMBECTOMY W/ EMBOLECTOMY Left 06/05/2017   Procedure: EXPLORATION OF LEFT ARM FOR BLEEDING; OVERSEWED PROXIMAL FISTULA;  Surgeon: Angelia Mould, MD;  Location: [place];  Service: Vascular;  Laterality: Left;  . WOUND EXPLORATION Left 06/03/2017   Procedure: WOUND EXPLORATION WITH WOUND VAC APPLICATION TO LEFT ARM;  Surgeon: Angelia Mould, MD;  Location: [facility];  Service: Vascular;  Laterality: Left;   Family History  Family History  Problem Relation Age of Onset  . Heart disease Mother   . Lung cancer Mother   . Heart disease Father   . Malignant hyperthermia Father   . COPD Father   . Throat cancer Sister   . Esophageal cancer Sister   . Hypertension Other   . COPD Other   . Colon cancer Neg Hx   . Colon polyps Neg Hx   . Rectal cancer Neg Hx   . Stomach cancer Neg Hx    Social History  reports that he has been smoking cigarettes. He started smoking about 46 years ago. He has a 21.50 pack-year smoking history. He has never used smokeless tobacco. He reports previous alcohol use. He reports previous drug use. Drugs: Marijuana and Cocaine. Allergies  Allergies  Allergen Reactions  . Tramadol Itching and Other (See Comments)  . Morphine And Related Other (See Comments)    Stomach pain  . Pollen Extract     Other reaction(s): Sneezing (finding)  . Acetaminophen Nausea Only    Stomach ache Other reaction(s): Other (See Comments) Stomach ache  . Aspirin Other (See Comments) and Itching    STOMACH PAIN Other reaction(s): Unknown STOMACH PAIN  . Clonidine Derivatives Itching   Home medications Prior to  Admission medications   Medication Sig Start Date  End Date Taking? Authorizing Provider  amiodarone (PACERONE) 200 MG tablet Take 1 tablet (200 mg total) by mouth 2 (two) times daily. 11/26/19 12/27/19  McDonald, Mia A, PA-C  diltiazem (CARDIZEM CD) 240 MG 24 hr capsule TAKE 1 CAPSULE BY MOUTH EVERY DAY Patient taking differently: Take 240 mg by mouth daily.  11/05/19   Adrian Prows, MD  gabapentin (NEURONTIN) 100 MG capsule Take 1 capsule (100 mg total) by mouth 3 (three) times daily for 90 doses. 12/27/19 01/26/20  Adrian Prows, MD  hydrALAZINE (APRESOLINE) 100 MG tablet Take 100 mg by mouth 2 (two) times daily.    [provider]  hydrOXYzine (ATARAX/VISTARIL) 25 MG tablet Take 1 tablet (25 mg total) by mouth 2 (two) times daily as needed for itching. 01/02/20   Adrian Prows, MD  lactulose (CHRONULAC) 10 GM/15ML solution Take 45 mLs (30 g total) by mouth daily as needed for mild constipation. 10/01/19   [name], MD  metoprolol tartrate (LOPRESSOR) 100 MG tablet Take 1 tablet (100 mg total) by mouth 2 (two) times daily. 06/04/19   Al Decant, MD  ondansetron (ZOFRAN) 4 MG tablet Take 4 mg by mouth every 4 (four) hours as needed for nausea or vomiting.  09/18/19   [provider]  pantoprazole (PROTONIX) 40 MG tablet Take 1 tablet (40 mg total) by mouth daily before breakfast. 12/27/19   Adrian Prows, MD  sevelamer carbonate (RENVELA) 800 MG tablet Take 1,600-3,200 mg by mouth See admin instructions. Take 4 tablets (3200 mg) by mouth 3 times daily with meals and 2 tablets (1600 mg) with snacks    [provider]     Vitals:   01/13/20 1617 01/13/20 1618 01/13/20 1620 01/13/20 1625  BP:   129/62 (!) 134/58  Pulse:      Resp: 19 (!) 21 (!) 27 (!) 25  Temp:      TempSrc:      SpO2: 100%      Exam Gen looks tired, no distress, lying flat on his side on stretcher No rash, cyanosis or gangrene Sclera anicteric, throat clear  +JVD no bruit Chest occ rales R base, L  clear  RRR no MRG Abd soft distended w/ 2-3+ ascites, nontender, no reboud GU normal male MS no joint effusions or deformity Ext mild pretib/ ankle edema, no wounds or ulcers Neuro is alert, Ox 3 , nf R arm AVG +bruit   Home meds:  - amiodarone 200 bid/ cardizem CD 240 qd/ hydralazine 100 bid/ metoprolol 100 bid  - renvela 2-3 ac tid/ protonix 40 qd/ lactulose 45 ml qd as needed  - neurontin 100 tid/ hydroxyzine 25 bid prn itching     OP HD: MWF [facility]    4h edw pend 2/2 bath 450/800 R arm AVG Hep none   Assessment/ Plan: 1. Bradycardia/ hyperkalemia - K+ 7.2 in ED.  HR 20's but has improved w/ temporizing measures (20's > 30's > 70's now). BP's have not been low.  Plan HD tonight, get K+ down.  2. ESRD - usual HD 2nd shift MWF.  HD tonight as above. He can go to his OP unit tomorrow if K/ heart rate are under control.  3. Cirrhosis/ recurrent ascites - recent LVP was on 7/1 4. HTN - cont meds , hold until after HD tonight though 5. Atrial fib - takes amio and BB, not on a/c 6. MBD ckd - cont renvela as binder, get OP records 7. Anemia ckd - Hb 12, no  need esa.  8. Hep B Ag + - HD in isolation bay upstairs      Rob Melinda Pottinger  MD 01/13/2020, 5:19 PM  Recent Labs  Lab 01/13/20 1557 01/13/20 1559  WBC  --  12.8*  HGB 12.9* 12.0*   Recent Labs  Lab 01/13/20 1557 01/13/20 1559  K 6.6* 7.2*  BUN 38* 38*  CREATININE 6.40* 5.77*  CALCIUM  --  9.4

## 2020-01-13 NOTE — H&P (Signed)
NAME:  [name], MRN:  [id number], DOB:  Dec 01, 1960, LOS: 0 ADMISSION DATE:  01/13/2020, CONSULTATION DATE:  01/13/2020 REFERRING MD:  Dr. Alric Ran, CHIEF COMPLAINT:  Hyperkalemia with HX of ESRD   Brief History   59yo male admitted with bradycardia and severe hyperkalemia.   History of present illness   [name] is a 59yo male wiith PMX significant for ESRD on iHD MW, paroxysmal A-fib, CAD, hypertension, cirrhosis with history of SBP presented with bradycardia and abdominal pain. Reports he is currently taking oral antibiotics for SBP.  Per chart review patient underwent IR paracentesis 01/10/2020 with 3 L of light pink-tinged fluid removed.  Does not appear any cultures were obtained from this paracentesis.  Workup on admission revalued revealed mild hypothermia with a temperature of 97 and bradycardia with a heart rate in the 20s to 30s all other vital signs within normal limits.  Lab work on arrival revealed sodium of 127, potassium 7.2, chloride 90, CO2 21, BUN 38, creatinine 5.77, elevated LFTs, WBC 12.8, hemoglobin 12.  EKG on admission consistent with profound bradycardia with a heart rate of 20.  Patient was given temporizing agents including albuterol, atropine, calcium gluconate, insulin, dextrose, and sodium bicarbonate with improvement in heart rate seen.  Given severe hyperkalemia with electrolyte abnormalities and urgent need for dialysis PCCM consulted for further management and admission.  Past Medical History  MRI Mitral stenosis End-stage renal disease on IHD Monday Wednesday Friday Hypertension Hyperkalemia Hep C Hep B GERD COPD Coronary disease Cardiomegaly Anxiety Anemia  Significant Hospital Events   Admitted 7/4  Consults:  Nephrology PCCM  Procedures:    Significant Diagnostic Tests:  Chest x-ray 7/4 > cardiomegaly with vascular congestion and pulmonary edema  Micro Data:  Covid 7/4 >   Antimicrobials:  Ceftriaxone 7/4   Interim  history/subjective:  Lying in bed with complaints of abdominal pain.   Objective   Blood pressure (!) 134/58, pulse (!) 31, temperature (!) 97 F (36.1 C), temperature source Axillary, resp. rate (!) 25, SpO2 100 %.    FiO2 (%):  [28 %] 28 %  No intake or output data in the 24 hours ending 01/13/20 1711 There were no vitals filed for this visit.  Examination: General: Chronically ill appearing cachetic elderly deconditioned male, lying in bed in NAD HEENT: [place]/AT, MM pink/moist, PERRL, sclera icteric, neck veins up Neuro: Alert and oriented, non-focal  CV: s1s2 regular rate and rhythm, no murmur, rubs, or gallops,  PULM:  Diminished bases, no accessory muscle use GI: distended, TTP diffusely, +hepatomegaly Extremities: warm/dry, sacral skin tear Skin: no rashes or lesions  High K, Low Na CXR with pulmonary edema Bedside US with trace ascites  Resolved Hospital Problem list     Assessment & Plan:  Severe hyperkalemia with bradycardia -EKG on admission consistent with profound bradycardia with a heart rate of 20.  Patient was given temporizing agents including albuterol, atropine, calcium gluconate, insulin, dextrose, and sodium bicarbonate with improvement in heart rate seen. HX ESRD on iHD MWF -Last iHD was Friday  P: Nephrology consulted, appreciate help Urgent iHD today  Admit to ICU  Continuous telemetry  Trend Bmet If bradycardia persist can consider repeating temporizing measures Follow renal function / urine output Trend Bmet Avoid nephrotoxins, ensure adequate renal perfusion  Continue home Renvela   Concern for recurrent SBP- exam c/w this.  Had paracentesis 3 days ago, no signs of significant re-accumulation. -Patient was recently admitted mid June of this year and treated for  sepsis secondary to SBP. Paracentesis during this hospitalization revealed greater than 250 PMNS, was treated with IC Ceftriaxone. Gram stain and culture was negative  P: Empiric  ceftriaxone  Bedside ultrasound reveals little fluid for sampling, patient refused diagnostic paracentesis at this time  - Blood cultures  - Can narrow to cefdinir on discharge  Cirrhosis  Elevated LFTs -Known history of cirrhosis, home medications include lactulose  P: -Supportive care  -Continue home lactulose   Paroxysmal A-fib  LBBB -Home medications include amiodarone, cardizem, and lopressor P: Given bradycardia will hold home antiarrhythmics, resume when appropriate Continuous telemetry   HS of systolic CHF Mild to moderate AS Moderate MS  -All seen of ECHO 10/04/2019 P: Home cardiac medications on hold as above  Fluid removal by HD  Heart/liver/renal failure at baseline- palliative consult, I see and appreciate NP Mahan's note on 09/28/19.  Patient reaffirms DNR  Best practice:  Diet: renal Pain/Anxiety/Delirium protocol (if indicated): N/A VAP protocol (if indicated): N/A DVT prophylaxis: heparin GI prophylaxis: PTA PPI Glucose control: N/A, his a1c is 4% Mobility: BR Code Status: DNR, confirmed with patient Family Communication: updated patient Disposition:  ICU and potentially home vs. Floor tomorrow  Labs   CBC: Recent Labs  Lab 01/13/20 1557 01/13/20 1559  WBC  --  12.8*  NEUTROABS  --  10.1*  HGB 12.9* 12.0*  HCT 38.0* 35.5*  MCV  --  81.6  PLT  --  778    Basic Metabolic Panel: Recent Labs  Lab 01/13/20 1557 01/13/20 1559  NA 124* 127*  K 6.6* 7.2*  CL 93* 90*  CO2  --  21*  GLUCOSE 122* 118*  BUN 38* 38*  CREATININE 6.40* 5.77*  CALCIUM  --  9.4  MG  --  2.2   GFR: CrCl cannot be calculated (Unknown ideal weight.). Recent Labs  Lab 01/13/20 1559  WBC 12.8*    Liver Function Tests: Recent Labs  Lab 01/13/20 1559  AST 54*  ALT 39  ALKPHOS 314*  BILITOT 1.1  PROT 6.8  ALBUMIN 2.6*   Recent Labs  Lab 01/13/20 1559  LIPASE 27   No results for input(s): AMMONIA in the last 168 hours.  ABG    Component Value  Date/Time   PHART 7.490 (H) 09/26/2019 0857   PCO2ART 42.1 09/26/2019 0857   PO2ART 136.0 (H) 09/26/2019 0857   HCO3 32.2 (H) 09/26/2019 0857   TCO2 23 01/13/2020 1557   ACIDBASEDEF 2.0 09/26/2019 0708   O2SAT 99.0 09/26/2019 0857     Coagulation Profile: Recent Labs  Lab 01/13/20 1559  INR 1.2    Cardiac Enzymes: No results for input(s): CKTOTAL, CKMB, CKMBINDEX, TROPONINI in the last 168 hours.  HbA1C: Hgb A1c MFr Bld  Date/Time Value Ref Range Status  09/26/2019 01:07 AM 4.7 (L) 4.8 - 5.6 % Final    Comment:    (NOTE) Pre diabetes:          5.7%-6.4% Diabetes:              >6.4% Glycemic control for   <7.0% adults with diabetes   04/24/2017 03:18 PM 4.6 (L) 4.8 - 5.6 % Final    Comment:    (NOTE) Pre diabetes:          5.7%-6.4% Diabetes:              >6.4% Glycemic control for   <7.0% adults with diabetes     CBG: Recent Labs  Lab 01/13/20 [place]  139*    Review of Systems:    Positive Symptoms in bold:  Constitutional fevers, chills, weight loss, fatigue, anorexia, malaise  Eyes decreased vision, double vision, eye irritation  Ears, Nose, Mouth, Throat sore throat, trouble swallowing, sinus congestion  Cardiovascular chest pain, paroxysmal nocturnal dyspnea, lower ext edema, palpitations   Respiratory SOB, cough, DOE, hemoptysis, wheezing  Gastrointestinal nausea, vomiting, diarrhea, abdominal pain  Genitourinary burning with urination, trouble urinating  Musculoskeletal joint aches, joint swelling, back pain  Integumentary  rashes, skin lesions  Neurological focal weakness, focal numbness, trouble speaking, headaches  Psychiatric depression, anxiety, confusion  Endocrine polyuria, polydipsia, cold intolerance, heat intolerance  Hematologic abnormal bruising, abnormal bleeding, unexplained nose bleeds  Allergic/Immunologic recurrent infections, hives, swollen lymph nodes     Past Medical History  He,  has a past medical history of  Anemia, Anxiety, Arthritis, Atherosclerosis of aorta (HCC), Cardiomegaly, Chest pain, Cocaine abuse ([place]), COPD exacerbation ([place]) (08/17/2016), Coronary artery disease, ESRD (end stage renal disease) on dialysis ([place]), GERD (gastroesophageal reflux disease), Hemorrhoids, Hepatitis B, chronic (HCC), Hepatitis C, History of kidney stones, Hyperkalemia, Hypertension, Kidney failure, Metabolic bone disease, Mitral stenosis, Myocardial infarction ([place]), Pneumonia, Pulmonary edema, Solitary rectal ulcer syndrome (07/2017), and Tubular adenoma of colon.   Surgical History    Past Surgical History:  Procedure Laterality Date  . A/V FISTULAGRAM Left 05/26/2017   Procedure: A/V FISTULAGRAM;  Surgeon: Conrad Germantown, MD;  Location: [place] [facility];  Service: Cardiovascular;  Laterality: Left;  . A/V FISTULAGRAM Right 11/18/2017   Procedure: A/V FISTULAGRAM - Right Arm;  Surgeon: Elam Dutch, MD;  Location: [place] [facility];  Service: Cardiovascular;  Laterality: Right;  . APPLICATION OF WOUND VAC Left 06/14/2017   Procedure: APPLICATION OF WOUND VAC;  Surgeon: Katha Cabal, MD;  Location: [facility];  Service: Vascular;  Laterality: Left;  . AV FISTULA PLACEMENT  2012   BELIEVED WAS PLACED IN JUNE  . AV FISTULA PLACEMENT Right 08/09/2017   Procedure: Creation Right arm ARTERIOVENOUS BRACHIOCEPOHALIC FISTULA;  Surgeon: Elam Dutch, MD;  Location: [facility];  Service: Vascular;  Laterality: Right;  . AV FISTULA PLACEMENT Right 11/22/2017   Procedure: INSERTION OF ARTERIOVENOUS (AV) GORE-TEX GRAFT RIGHT UPPER ARM;  Surgeon: Elam Dutch, MD;  Location: [place];  Service: Vascular;  Laterality: Right;  . BIOPSY  01/25/2018   Procedure: BIOPSY;  Surgeon: Jerene Bears, MD;  Location: [place];  Service: Gastroenterology;;  . BIOPSY  04/10/2019   Procedure: BIOPSY;  Surgeon: Jerene Bears, MD;  Location: [facility];  Service: Gastroenterology;;  . COLONOSCOPY    . COLONOSCOPY WITH PROPOFOL  N/A 01/25/2018   Procedure: COLONOSCOPY WITH PROPOFOL;  Surgeon: Jerene Bears, MD;  Location: [place];  Service: Gastroenterology;  Laterality: N/A;  . CORONARY STENT INTERVENTION N/A 02/22/2017   Procedure: CORONARY STENT INTERVENTION;  Surgeon: Nigel Mormon, MD;  Location: [place] [facility];  Service: Cardiovascular;  Laterality: N/A;  . ESOPHAGOGASTRODUODENOSCOPY (EGD) WITH PROPOFOL N/A 01/25/2018   Procedure: ESOPHAGOGASTRODUODENOSCOPY (EGD) WITH PROPOFOL;  Surgeon: Jerene Bears, MD;  Location: [place];  Service: Gastroenterology;  Laterality: N/A;  . ESOPHAGOGASTRODUODENOSCOPY (EGD) WITH PROPOFOL N/A 04/10/2019   Procedure: ESOPHAGOGASTRODUODENOSCOPY (EGD) WITH PROPOFOL;  Surgeon: Jerene Bears, MD;  Location: [facility];  Service: Gastroenterology;  Laterality: N/A;  . FLEXIBLE SIGMOIDOSCOPY N/A 07/15/2017   Procedure: FLEXIBLE SIGMOIDOSCOPY;  Surgeon: Carol Ada, MD;  Location: [place];  Service: Endoscopy;  Laterality: N/A;  .  HEMORRHOID BANDING    . I & D EXTREMITY Left 06/01/2017   Procedure: IRRIGATION AND DEBRIDEMENT LEFT ARM HEMATOMA WITH LIGATION OF LEFT ARM AV FISTULA;  Surgeon: Elam Dutch, MD;  Location: [place];  Service: Vascular;  Laterality: Left;  . I & D EXTREMITY Left 06/14/2017   Procedure: IRRIGATION AND DEBRIDEMENT EXTREMITY;  Surgeon: Katha Cabal, MD;  Location: [facility];  Service: Vascular;  Laterality: Left;  . INSERTION OF DIALYSIS CATHETER  05/30/2017  . INSERTION OF DIALYSIS CATHETER N/A 05/30/2017   Procedure: INSERTION OF DIALYSIS CATHETER;  Surgeon: Elam Dutch, MD;  Location: [place];  Service: Vascular;  Laterality: N/A;  . IR PARACENTESIS  08/30/2017  . IR PARACENTESIS  09/29/2017  . IR PARACENTESIS  10/28/2017  . IR PARACENTESIS  11/09/2017  . IR PARACENTESIS  11/16/2017  . IR PARACENTESIS  11/28/2017  . IR PARACENTESIS  12/01/2017  . IR PARACENTESIS  12/06/2017  . IR PARACENTESIS  01/03/2018  . IR PARACENTESIS  01/23/2018    . IR PARACENTESIS  02/07/2018  . IR PARACENTESIS  02/21/2018  . IR PARACENTESIS  03/06/2018  . IR PARACENTESIS  03/17/2018  . IR PARACENTESIS  04/04/2018  . IR PARACENTESIS  12/28/2018  . IR PARACENTESIS  01/08/2019  . IR PARACENTESIS  01/23/2019  . IR PARACENTESIS  02/01/2019  . IR PARACENTESIS  02/19/2019  . IR PARACENTESIS  03/01/2019  . IR PARACENTESIS  03/15/2019  . IR PARACENTESIS  04/03/2019  . IR PARACENTESIS  04/12/2019  . IR PARACENTESIS  05/01/2019  . IR PARACENTESIS  05/08/2019  . IR PARACENTESIS  05/24/2019  . IR PARACENTESIS  06/12/2019  . IR PARACENTESIS  07/09/2019  . IR PARACENTESIS  07/27/2019  . IR PARACENTESIS  08/09/2019  . IR PARACENTESIS  08/21/2019  . IR PARACENTESIS  09/17/2019  . IR PARACENTESIS  10/05/2019  . IR PARACENTESIS  10/29/2019  . IR PARACENTESIS  11/08/2019  . IR PARACENTESIS  12/12/2019  . IR PARACENTESIS  01/03/2020  . IR PARACENTESIS  01/10/2020  . IR RADIOLOGIST EVAL & MGMT  02/14/2018  . IR RADIOLOGIST EVAL & MGMT  02/22/2019  . LEFT HEART CATH AND CORONARY ANGIOGRAPHY N/A 02/22/2017   Procedure: LEFT HEART CATH AND CORONARY ANGIOGRAPHY;  Surgeon: Nigel Mormon, MD;  Location: [place] [facility];  Service: Cardiovascular;  Laterality: N/A;  . LIGATION OF ARTERIOVENOUS  FISTULA Left 12/16/6193   Procedure: Plication of Left Arm Arteriovenous Fistula;  Surgeon: Elam Dutch, MD;  Location: [place];  Service: Vascular;  Laterality: Left;  . POLYPECTOMY    . POLYPECTOMY  01/25/2018   Procedure: POLYPECTOMY;  Surgeon: Jerene Bears, MD;  Location: [place];  Service: Gastroenterology;;  . REVISON OF ARTERIOVENOUS FISTULA Left 0/93/2671   Procedure: PLICATION OF DISTAL ANEURYSMAL SEGEMENT OF LEFT UPPER ARM ARTERIOVENOUS FISTULA;  Surgeon: Elam Dutch, MD;  Location: [place];  Service: Vascular;  Laterality: Left;  . REVISON OF ARTERIOVENOUS FISTULA Left 2/45/8099   Procedure: Plication of Left Upper Arm Fistula ;  Surgeon: Waynetta Sandy, MD;   Location: [place];  Service: Vascular;  Laterality: Left;  . SKIN GRAFT SPLIT THICKNESS LEG / FOOT Left    SKIN GRAFT SPLIT THICKNESS LEFT ARM DONOR SITE: LEFT ANTERIOR THIGH  . SKIN SPLIT GRAFT Left 07/04/2017   Procedure: SKIN GRAFT SPLIT THICKNESS LEFT ARM DONOR SITE: LEFT ANTERIOR THIGH;  Surgeon: Elam Dutch, MD;  Location: [place];  Service: Vascular;  Laterality: Left;  .  THROMBECTOMY W/ EMBOLECTOMY Left 06/05/2017   Procedure: EXPLORATION OF LEFT ARM FOR BLEEDING; OVERSEWED PROXIMAL FISTULA;  Surgeon: Angelia Mould, MD;  Location: [place];  Service: Vascular;  Laterality: Left;  . WOUND EXPLORATION Left 06/03/2017   Procedure: WOUND EXPLORATION WITH WOUND VAC APPLICATION TO LEFT ARM;  Surgeon: Angelia Mould, MD;  Location: [place];  Service: Vascular;  Laterality: Left;     Social History   reports that he has been smoking cigarettes. He started smoking about 46 years ago. He has a 21.50 pack-year smoking history. He has never used smokeless tobacco. He reports previous alcohol use. He reports previous drug use. Drugs: Marijuana and Cocaine.   Family History   His family history includes COPD in his father and another family member; Esophageal cancer in his sister; Heart disease in his father and mother; Hypertension in an other family member; Lung cancer in his mother; Malignant hyperthermia in his father; Throat cancer in his sister. There is no history of Colon cancer, Colon polyps, Rectal cancer, or Stomach cancer.   Allergies Allergies  Allergen Reactions  . Tramadol Itching and Other (See Comments)  . Morphine And Related Other (See Comments)    Stomach pain  . Pollen Extract     Other reaction(s): Sneezing (finding)  . Acetaminophen Nausea Only    Stomach ache Other reaction(s): Other (See Comments) Stomach ache  . Aspirin Other (See Comments) and Itching    STOMACH PAIN Other reaction(s): Unknown STOMACH PAIN  . Clonidine Derivatives Itching      Home Medications  Prior to Admission medications   Medication Sig Start Date End Date Taking? Authorizing Provider  amiodarone (PACERONE) 200 MG tablet Take 1 tablet (200 mg total) by mouth 2 (two) times daily. 11/26/19 12/27/19  McDonald, Mia A, PA-C  diltiazem (CARDIZEM CD) 240 MG 24 hr capsule TAKE 1 CAPSULE BY MOUTH EVERY DAY Patient taking differently: Take 240 mg by mouth daily.  11/05/19   Adrian Prows, MD  gabapentin (NEURONTIN) 100 MG capsule Take 1 capsule (100 mg total) by mouth 3 (three) times daily for 90 doses. 12/27/19 01/26/20  Adrian Prows, MD  hydrALAZINE (APRESOLINE) 100 MG tablet Take 100 mg by mouth 2 (two) times daily.    [provider]  hydrOXYzine (ATARAX/VISTARIL) 25 MG tablet Take 1 tablet (25 mg total) by mouth 2 (two) times daily as needed for itching. 01/02/20   Adrian Prows, MD  lactulose (CHRONULAC) 10 GM/15ML solution Take 45 mLs (30 g total) by mouth daily as needed for mild constipation. 10/01/19   [name], MD  metoprolol tartrate (LOPRESSOR) 100 MG tablet Take 1 tablet (100 mg total) by mouth 2 (two) times daily. 06/04/19   Al Decant, MD  ondansetron (ZOFRAN) 4 MG tablet Take 4 mg by mouth every 4 (four) hours as needed for nausea or vomiting.  09/18/19   [provider]  pantoprazole (PROTONIX) 40 MG tablet Take 1 tablet (40 mg total) by mouth daily before breakfast. 12/27/19   Adrian Prows, MD  sevelamer carbonate (RENVELA) 800 MG tablet Take 1,600-3,200 mg by mouth See admin instructions. Take 4 tablets (3200 mg) by mouth 3 times daily with meals and 2 tablets (1600 mg) with snacks    [provider]     Critical care time: 35 minutes

## 2020-01-13 NOTE — ED Triage Notes (Signed)
Pt here for eval of dizziness since this morning and abdominal pain since Thursday when he had a paracentesis. Denies n/v/d but sts his stomach is "queezy."

## 2020-01-14 ENCOUNTER — Observation Stay (HOSPITAL_COMMUNITY): Payer: Medicare Other

## 2020-01-14 ENCOUNTER — Emergency Department (HOSPITAL_COMMUNITY): Payer: Medicare Other

## 2020-01-14 ENCOUNTER — Encounter (HOSPITAL_COMMUNITY): Payer: Self-pay

## 2020-01-14 ENCOUNTER — Observation Stay (HOSPITAL_BASED_OUTPATIENT_CLINIC_OR_DEPARTMENT_OTHER)
Admission: EM | Admit: 2020-01-14 | Discharge: 2020-01-15 | Disposition: A | Discharge status: Home / Self Care | Payer: Medicare Other | Source: Home / Self Care | Attending: Emergency Medicine | Admitting: Emergency Medicine

## 2020-01-14 ENCOUNTER — Encounter (HOSPITAL_COMMUNITY): Payer: Self-pay | Admitting: Internal Medicine

## 2020-01-14 ENCOUNTER — Other Ambulatory Visit: Payer: Self-pay

## 2020-01-14 DIAGNOSIS — F17210 Nicotine dependence, cigarettes, uncomplicated: Secondary | ICD-10-CM

## 2020-01-14 DIAGNOSIS — Z20822 Contact with and (suspected) exposure to covid-19: Secondary | ICD-10-CM | POA: Diagnosis not present

## 2020-01-14 DIAGNOSIS — Z992 Dependence on renal dialysis: Secondary | ICD-10-CM

## 2020-01-14 DIAGNOSIS — I2510 Atherosclerotic heart disease of native coronary artery without angina pectoris: Secondary | ICD-10-CM

## 2020-01-14 DIAGNOSIS — I480 Paroxysmal atrial fibrillation: Secondary | ICD-10-CM

## 2020-01-14 DIAGNOSIS — R188 Other ascites: Secondary | ICD-10-CM

## 2020-01-14 DIAGNOSIS — N186 End stage renal disease: Secondary | ICD-10-CM

## 2020-01-14 DIAGNOSIS — J441 Chronic obstructive pulmonary disease with (acute) exacerbation: Secondary | ICD-10-CM | POA: Diagnosis not present

## 2020-01-14 DIAGNOSIS — J9601 Acute respiratory failure with hypoxia: Secondary | ICD-10-CM | POA: Diagnosis not present

## 2020-01-14 DIAGNOSIS — Z66 Do not resuscitate: Secondary | ICD-10-CM | POA: Diagnosis present

## 2020-01-14 DIAGNOSIS — R109 Unspecified abdominal pain: Secondary | ICD-10-CM | POA: Diagnosis present

## 2020-01-14 DIAGNOSIS — K7460 Unspecified cirrhosis of liver: Secondary | ICD-10-CM | POA: Diagnosis not present

## 2020-01-14 DIAGNOSIS — R1084 Generalized abdominal pain: Secondary | ICD-10-CM | POA: Diagnosis not present

## 2020-01-14 LAB — BLOOD GAS, ARTERIAL
Acid-Base Excess: 5.8 mmol/L — ABNORMAL HIGH (ref 0.0–2.0)
Bicarbonate: 30.1 mmol/L — ABNORMAL HIGH (ref 20.0–28.0)
FIO2: 35
O2 Saturation: 92.2 %
Patient temperature: 36.9
pCO2 arterial: 46.4 mmHg (ref 32.0–48.0)
pH, Arterial: 7.427 (ref 7.350–7.450)
pO2, Arterial: 64.9 mmHg — ABNORMAL LOW (ref 83.0–108.0)

## 2020-01-14 LAB — CBC WITH DIFFERENTIAL/PLATELET
Abs Immature Granulocytes: 0.08 10*3/uL — ABNORMAL HIGH (ref 0.00–0.07)
Basophils Absolute: 0.1 10*3/uL (ref 0.0–0.1)
Basophils Relative: 1 %
Eosinophils Absolute: 0.2 10*3/uL (ref 0.0–0.5)
Eosinophils Relative: 2 %
HCT: 31 % — ABNORMAL LOW (ref 39.0–52.0)
Hemoglobin: 10.5 g/dL — ABNORMAL LOW (ref 13.0–17.0)
Immature Granulocytes: 1 %
Lymphocytes Relative: 8 %
Lymphs Abs: 0.8 10*3/uL (ref 0.7–4.0)
MCH: 27.5 pg (ref 26.0–34.0)
MCHC: 33.9 g/dL (ref 30.0–36.0)
MCV: 81.2 fL (ref 80.0–100.0)
Monocytes Absolute: 0.8 10*3/uL (ref 0.1–1.0)
Monocytes Relative: 7 %
Neutro Abs: 9 10*3/uL — ABNORMAL HIGH (ref 1.7–7.7)
Neutrophils Relative %: 81 %
Platelets: 215 10*3/uL (ref 150–400)
RBC: 3.82 MIL/uL — ABNORMAL LOW (ref 4.22–5.81)
RDW: 15.3 % (ref 11.5–15.5)
WBC: 11 10*3/uL — ABNORMAL HIGH (ref 4.0–10.5)
nRBC: 0 % (ref 0.0–0.2)

## 2020-01-14 LAB — HEPATIC FUNCTION PANEL
ALT: 37 U/L (ref 0–44)
AST: 46 U/L — ABNORMAL HIGH (ref 15–41)
Albumin: 2.6 g/dL — ABNORMAL LOW (ref 3.5–5.0)
Alkaline Phosphatase: 337 U/L — ABNORMAL HIGH (ref 38–126)
Bilirubin, Direct: 0.3 mg/dL — ABNORMAL HIGH (ref 0.0–0.2)
Indirect Bilirubin: 0.4 mg/dL (ref 0.3–0.9)
Total Bilirubin: 0.7 mg/dL (ref 0.3–1.2)
Total Protein: 7 g/dL (ref 6.5–8.1)

## 2020-01-14 LAB — BASIC METABOLIC PANEL
Anion gap: 13 (ref 5–15)
Anion gap: 15 (ref 5–15)
BUN: 15 mg/dL (ref 6–20)
BUN: 19 mg/dL (ref 6–20)
CO2: 24 mmol/L (ref 22–32)
CO2: 27 mmol/L (ref 22–32)
Calcium: 9 mg/dL (ref 8.9–10.3)
Calcium: 9 mg/dL (ref 8.9–10.3)
Chloride: 95 mmol/L — ABNORMAL LOW (ref 98–111)
Chloride: 91 mmol/L — ABNORMAL LOW (ref 98–111)
Creatinine, Ser: 3.15 mg/dL — ABNORMAL HIGH (ref 0.61–1.24)
Creatinine, Ser: 3.32 mg/dL — ABNORMAL HIGH (ref 0.61–1.24)
GFR calc Af Amer: 24 mL/min — ABNORMAL LOW (ref >60)
GFR calc Af Amer: 23 mL/min — ABNORMAL LOW (ref >60)
GFR calc non Af Amer: 21 mL/min — ABNORMAL LOW (ref >60)
GFR calc non Af Amer: 19 mL/min — ABNORMAL LOW (ref >60)
Glucose, Bld: 124 mg/dL — ABNORMAL HIGH (ref 70–99)
Glucose, Bld: 97 mg/dL (ref 70–99)
Potassium: 4.2 mmol/L (ref 3.5–5.1)
Potassium: 4.1 mmol/L (ref 3.5–5.1)
Sodium: 132 mmol/L — ABNORMAL LOW (ref 135–145)
Sodium: 133 mmol/L — ABNORMAL LOW (ref 135–145)

## 2020-01-14 LAB — CBC
HCT: 31.6 % — ABNORMAL LOW (ref 39.0–52.0)
Hemoglobin: 11 g/dL — ABNORMAL LOW (ref 13.0–17.0)
MCH: 27.6 pg (ref 26.0–34.0)
MCHC: 34.8 g/dL (ref 30.0–36.0)
MCV: 79.4 fL — ABNORMAL LOW (ref 80.0–100.0)
Platelets: 196 10*3/uL (ref 150–400)
RBC: 3.98 MIL/uL — ABNORMAL LOW (ref 4.22–5.81)
RDW: 15 % (ref 11.5–15.5)
WBC: 12.9 10*3/uL — ABNORMAL HIGH (ref 4.0–10.5)
nRBC: 0 % (ref 0.0–0.2)

## 2020-01-14 LAB — GLUCOSE, CAPILLARY
Glucose-Capillary: 120 mg/dL — ABNORMAL HIGH (ref 70–99)
Glucose-Capillary: 122 mg/dL — ABNORMAL HIGH (ref 70–99)

## 2020-01-14 LAB — BRAIN NATRIURETIC PEPTIDE: B Natriuretic Peptide: 2254.1 pg/mL — ABNORMAL HIGH (ref 0.0–100.0)

## 2020-01-14 LAB — MAGNESIUM: Magnesium: 2 mg/dL (ref 1.7–2.4)

## 2020-01-14 LAB — TROPONIN I (HIGH SENSITIVITY): Troponin I (High Sensitivity): 45 ng/L — ABNORMAL HIGH (ref <18)

## 2020-01-14 LAB — PHOSPHORUS: Phosphorus: 3.9 mg/dL (ref 2.5–4.6)

## 2020-01-14 LAB — SARS CORONAVIRUS 2 BY RT PCR (HOSPITAL ORDER, PERFORMED IN ~~LOC~~ HOSPITAL LAB): SARS Coronavirus 2: NEGATIVE

## 2020-01-14 MED ORDER — SODIUM POLYSTYRENE SULFONATE 15 GM/60ML PO SUSP: 30.0000 g | Freq: Every day | ORAL | 0 refills | Status: DC | PRN

## 2020-01-14 MED ORDER — HYDROXYZINE HCL 25 MG PO TABS
25.0000 mg | ORAL_TABLET | Freq: Two times a day (BID) | ORAL | Status: DC | PRN
  Administered 2020-01-14 – 2020-01-15 (×2): 25 mg via ORAL
  Filled 2020-01-14 (×3): qty 1

## 2020-01-14 MED ORDER — GABAPENTIN 100 MG PO CAPS
100.0000 mg | ORAL_CAPSULE | Freq: Three times a day (TID) | ORAL | Status: DC
  Administered 2020-01-14 – 2020-01-15 (×2): 100 mg via ORAL
  Filled 2020-01-14 (×2): qty 1

## 2020-01-14 MED ORDER — SODIUM POLYSTYRENE SULFONATE 15 GM/60ML PO SUSP: 30.0000 g | Freq: Every day | ORAL | Status: DC | PRN

## 2020-01-14 MED ORDER — HYDROMORPHONE HCL 1 MG/ML IJ SOLN
0.5000 mg | Freq: Once | INTRAMUSCULAR | Status: AC
  Administered 2020-01-14: 0.5 mg via INTRAVENOUS
  Filled 2020-01-14: qty 1

## 2020-01-14 MED ORDER — OXYCODONE HCL 10 MG PO TABS: 10.0000 mg | ORAL_TABLET | Freq: Three times a day (TID) | ORAL | 0 refills | Status: DC | PRN

## 2020-01-14 MED ORDER — SEVELAMER CARBONATE 800 MG PO TABS
3200.0000 mg | ORAL_TABLET | Freq: Three times a day (TID) | ORAL | Status: DC
  Filled 2020-01-14: qty 4

## 2020-01-14 MED ORDER — NEPRO/CARBSTEADY PO LIQD: 237.0000 mL | Freq: Two times a day (BID) | ORAL | Status: DC

## 2020-01-14 MED ORDER — ACETAMINOPHEN 325 MG PO TABS: 325.0000 mg | ORAL_TABLET | Freq: Four times a day (QID) | ORAL | Status: DC | PRN

## 2020-01-14 MED ORDER — LACTULOSE 10 GM/15ML PO SOLN
30.0000 g | Freq: Every day | ORAL | Status: DC | PRN
  Filled 2020-01-14: qty 45

## 2020-01-14 MED ORDER — PANTOPRAZOLE SODIUM 40 MG PO TBEC
40.0000 mg | DELAYED_RELEASE_TABLET | Freq: Every day | ORAL | Status: DC
  Administered 2020-01-14 – 2020-01-15 (×2): 40 mg via ORAL
  Filled 2020-01-14 (×2): qty 1

## 2020-01-14 MED ORDER — OXYCODONE HCL 5 MG PO TABS
5.0000 mg | ORAL_TABLET | Freq: Four times a day (QID) | ORAL | Status: DC | PRN
  Administered 2020-01-14 (×2): 5 mg via ORAL
  Filled 2020-01-14 (×2): qty 1

## 2020-01-14 MED ORDER — POLYETHYLENE GLYCOL 3350 17 G PO PACK: 17.0000 g | PACK | Freq: Every day | ORAL | Status: DC | PRN

## 2020-01-14 MED ORDER — CEFDINIR 300 MG PO CAPS
300.0000 mg | ORAL_CAPSULE | Freq: Every day | ORAL | Status: DC
  Administered 2020-01-14: 300 mg via ORAL
  Filled 2020-01-14: qty 1

## 2020-01-14 MED ORDER — DIPHENHYDRAMINE HCL 50 MG/ML IJ SOLN
12.5000 mg | Freq: Once | INTRAMUSCULAR | Status: AC
  Administered 2020-01-14: 12.5 mg via INTRAVENOUS
  Filled 2020-01-14: qty 1

## 2020-01-14 MED ORDER — HYDROMORPHONE HCL 1 MG/ML IJ SOLN
1.0000 mg | Freq: Once | INTRAMUSCULAR | Status: DC
  Filled 2020-01-14: qty 1

## 2020-01-14 MED ORDER — WHITE PETROLATUM EX OINT
TOPICAL_OINTMENT | CUTANEOUS | Status: AC
  Filled 2020-01-14: qty 28.35

## 2020-01-14 MED ORDER — HYDROMORPHONE HCL 1 MG/ML IJ SOLN
0.5000 mg | Freq: Once | INTRAMUSCULAR | Status: AC
  Administered 2020-01-14: 0.5 mg via INTRAVENOUS
  Filled 2020-01-14: qty 0.5

## 2020-01-14 MED ORDER — HYDROMORPHONE HCL 1 MG/ML IJ SOLN
1.0000 mg | Freq: Once | INTRAMUSCULAR | Status: AC
  Administered 2020-01-15: 1 mg via INTRAVENOUS
  Filled 2020-01-14: qty 1

## 2020-01-14 MED ORDER — METHYLPREDNISOLONE SODIUM SUCC 40 MG IJ SOLR
40.0000 mg | Freq: Two times a day (BID) | INTRAMUSCULAR | Status: DC
  Administered 2020-01-14 – 2020-01-15 (×2): 40 mg via INTRAVENOUS
  Filled 2020-01-14 (×2): qty 1

## 2020-01-14 MED ORDER — OXYCODONE HCL 5 MG PO TABS
10.0000 mg | ORAL_TABLET | Freq: Four times a day (QID) | ORAL | Status: DC | PRN
  Administered 2020-01-14 – 2020-01-15 (×2): 10 mg via ORAL
  Filled 2020-01-14 (×3): qty 2

## 2020-01-14 MED ORDER — DIPHENOXYLATE-ATROPINE 2.5-0.025 MG PO TABS
1.0000 | ORAL_TABLET | Freq: Four times a day (QID) | ORAL | Status: DC | PRN
  Administered 2020-01-14: 1 via ORAL
  Filled 2020-01-14: qty 1

## 2020-01-14 MED ORDER — SEVELAMER CARBONATE 800 MG PO TABS
1600.0000 mg | ORAL_TABLET | Freq: Three times a day (TID) | ORAL | Status: DC | PRN
  Administered 2020-01-15: 1600 mg via ORAL
  Filled 2020-01-14: qty 2

## 2020-01-14 MED ORDER — IPRATROPIUM-ALBUTEROL 0.5-2.5 (3) MG/3ML IN SOLN: 3.0000 mL | Freq: Four times a day (QID) | RESPIRATORY_TRACT | Status: DC

## 2020-01-14 MED ORDER — IOHEXOL 350 MG/ML SOLN
60.0000 mL | Freq: Once | INTRAVENOUS | Status: AC | PRN
  Administered 2020-01-14: 60 mL via INTRAVENOUS

## 2020-01-14 MED ORDER — IPRATROPIUM-ALBUTEROL 0.5-2.5 (3) MG/3ML IN SOLN: 3.0000 mL | RESPIRATORY_TRACT | Status: DC | PRN

## 2020-01-14 MED ORDER — ACETAMINOPHEN 650 MG RE SUPP: 650.0000 mg | Freq: Four times a day (QID) | RECTAL | Status: DC | PRN

## 2020-01-14 MED ORDER — ONDANSETRON HCL 4 MG/2ML IJ SOLN
4.0000 mg | Freq: Once | INTRAMUSCULAR | Status: AC
  Administered 2020-01-14: 4 mg via INTRAVENOUS
  Filled 2020-01-14: qty 2

## 2020-01-14 MED ORDER — HYDRALAZINE HCL 50 MG PO TABS
100.0000 mg | ORAL_TABLET | Freq: Three times a day (TID) | ORAL | Status: DC
  Administered 2020-01-14 – 2020-01-15 (×2): 100 mg via ORAL
  Filled 2020-01-14 (×2): qty 2

## 2020-01-14 MED ORDER — METOPROLOL TARTRATE 100 MG PO TABS
100.0000 mg | ORAL_TABLET | Freq: Two times a day (BID) | ORAL | Status: DC
  Administered 2020-01-14 – 2020-01-15 (×2): 100 mg via ORAL
  Filled 2020-01-14 (×2): qty 1

## 2020-01-14 MED ORDER — SEVELAMER CARBONATE 800 MG PO TABS: 1600.0000 mg | ORAL_TABLET | ORAL | Status: DC

## 2020-01-14 MED ORDER — CEFDINIR 300 MG PO CAPS: 300.0000 mg | ORAL_CAPSULE | Freq: Every day | ORAL | 0 refills | Status: DC

## 2020-01-14 MED ORDER — HEPARIN SODIUM (PORCINE) 5000 UNIT/ML IJ SOLN
5000.0000 [IU] | Freq: Three times a day (TID) | INTRAMUSCULAR | Status: DC
  Filled 2020-01-14 (×2): qty 1

## 2020-01-14 MED ORDER — WHITE PETROLATUM EX OINT
TOPICAL_OINTMENT | CUTANEOUS | Status: AC
  Administered 2020-01-14: 0.2
  Filled 2020-01-14: qty 28.35

## 2020-01-14 MED FILL — oxyCODONE HCL 10 MG TABS: 10 | 4 days supply | Qty: 32 | Fill #0

## 2020-01-14 MED FILL — SPS 15 GM/60 ML SUSPENSION: 15 | 4 days supply | Qty: 240 | Fill #0

## 2020-01-14 MED FILL — CEFDINIR 300 MG CAPSULE: 300 | 6 days supply | Qty: 6 | Fill #0

## 2020-01-14 NOTE — ED Notes (Signed)
EDP and RN reassessing bp and spo2. Placed on 2 lpm [place]

## 2020-01-14 NOTE — Discharge Instructions (Signed)
-   Take antibiotics as prescribed - Stop your amiodarone and diltiazem for now, okay to continue metoprolol and hydralazine, stop if your blood pressures are low or you are dizzy - Go get dialysis today - I gave you enough pain medicine to last until your next pain medicine appointment - Use kayexalate as needed for constipation, this will also help your potassium

## 2020-01-14 NOTE — Progress Notes (Signed)
eLink Physician-Brief Progress Note Patient Name: [name] DOB: 26-Jan-1961 MRN: [id number]   Date of Service  01/14/2020  HPI/Events of Note  Abdominal pain. Patient states he takes oxycodone at home for this.   eICU Interventions  Ordered oxycodone 5mg  PO Q6H PRN moderate pain.     Intervention Category Intermediate Interventions: Pain - evaluation and management  Marily Lente Jeron Grahn 01/14/2020, 12:40 AM

## 2020-01-14 NOTE — Care Management Obs Status (Signed)
[place] NOTIFICATION   Patient Details  Name: [name] MRN: [id number] Date of Birth: 1960/09/15   Medicare Observation Status Notification Given:  Yes    Bethena Roys, RN 01/14/2020, 9:11 AM

## 2020-01-14 NOTE — Progress Notes (Signed)
Patient refused ABG. MD aware and okay to hold off on ABG at this time.

## 2020-01-14 NOTE — Care Management CC44 (Signed)
Condition Code 44 Documentation Completed  Patient Details  Name: [name] MRN: [id number] Date of Birth: 1961-04-06   Condition Code 44 given:  Yes Patient signature on Condition Code 44 notice:  Yes Documentation of 2 MD's agreement:  Yes Code 44 added to claim:  Yes    Bethena Roys, RN 01/14/2020, 9:11 AM

## 2020-01-14 NOTE — Progress Notes (Signed)
RT proceeded to collect ABG from pt and pt declined stating he was leaving and does not want to be stuck. RT will continue to monitor.

## 2020-01-14 NOTE — ED Notes (Signed)
Pt continues to pull BP cuff and [place]  off.

## 2020-01-14 NOTE — Progress Notes (Signed)
Patient arrived via stretcher from dialysis to 2M07.  Alert and oriented X4. Pupils equal and reactive.  Patient is on 4L [place]. VSS. Afebrile. Patient walked from stretcher to bathroom without assistance.  Two PIV's in left arm, flushed, and saline locked. RUA fistula with +bruit and +thrill. Dressing intact. Patient also has a non functioning fistula to LUA.   Patient has a gauze dressing to LLQ from previous paracentesis last Thursday. States "that its still draining" so foam was placed.  Gross ascites noted. C/o pain. Stage 2 healing pressure ulcer noted to right upper buttocks. Foam pad placed. No other issues seen. Patient has a pair of shoes, grey jogging pants, and cell phone at bedside.  Requesting food. Patient says he will stay tonight and leave tomorrow. Elink aware of arrival.

## 2020-01-14 NOTE — Progress Notes (Signed)
[facility] NEPHROLOGY PROGRESS NOTE  Assessment/ Plan: Pt is a 59 y.o. yo male with history of hep B, HTN, COPD, substance abuse, ESRD on HD admitted with bradycardia associated with hyperkalemia, K7.2.  OP HD: [facility],  4h edw pend 2/2 bath 450/800 R arm AVG Hep none  #Bradycardia/hyperkalemia: Serum potassium level of 7.2 in ER with heart rate in 20s.  Initially improved with temporizing measures.  Received urgent hemodialysis with improvement of serum potassium level.  Now heart rate has improved and he is asymptomatic.  # ESRD MWF: Had dialysis yesterday for hyperkalemia.  Plan for another treatment today to resume his schedule.  However patient wants to be discharged from hospital.  He reports that he will go to outpatient center for dialysis this afternoon.  Discussed with ICU team as well.  # Anemia of CKD: Hemoglobin at goal.  No ESA.  Monitor.  # Secondary hyperparathyroidism: Continue binders.  Calcium and phosphorus level at goal.  # HTN/volume: Blood pressure level acceptable.  Subjective: Seen and examined in ICU.  He is sitting on chair comfortable and stating that he is leaving soon to go home.  He denies headache, dizziness, nausea vomiting chest pain shortness of breath.  He understand that he will go to dialysis this afternoon.  Objective Vital signs in last 24 hours: Vitals:   01/14/20 0339 01/14/20 0400 01/14/20 0700 01/14/20 0800  BP:      Pulse:  75 73 75  Resp:  19 20 18   Temp:  97.9 F (36.6 C)  98 F (36.7 C)  TempSrc:  Oral  Oral  SpO2:  97% 96% 93%  Weight: 53.4 kg      Weight change:   Intake/Output Summary (Last 24 hours) at 01/14/2020 0844 Last data filed at 01/14/2020 0800 Gross per 24 hour  Intake 2186.67 ml  Output 1981 ml  Net 205.67 ml       Labs: Basic Metabolic Panel: Recent Labs  Lab 01/13/20 1557 01/13/20 1559 01/14/20 0023  NA 124* 127* 132*  K 6.6* 7.2* 4.2  CL 93* 90* 95*  CO2  --  21* 24  GLUCOSE  122* 118* 124*  BUN 38* 38* 15  CREATININE 6.40* 5.77* 3.15*  CALCIUM  --  9.4 9.0  PHOS  --   --  3.9   Liver Function Tests: Recent Labs  Lab 01/13/20 1559  AST 54*  ALT 39  ALKPHOS 314*  BILITOT 1.1  PROT 6.8  ALBUMIN 2.6*   Recent Labs  Lab 01/13/20 1559  LIPASE 27   No results for input(s): AMMONIA in the last 168 hours. CBC: Recent Labs  Lab 01/13/20 1557 01/13/20 1559 01/14/20 0023  WBC  --  12.8* 12.9*  NEUTROABS  --  10.1*  --   HGB 12.9* 12.0* 11.0*  HCT 38.0* 35.5* 31.6*  MCV  --  81.6 79.4*  PLT  --  283 196   Cardiac Enzymes: No results for input(s): CKTOTAL, CKMB, CKMBINDEX, TROPONINI in the last 168 hours. CBG: Recent Labs  Lab 01/13/20 1545 01/13/20 1851 01/14/20 0023 01/14/20 0730  GLUCAP 139* 64* 122* 120*    Iron Studies: No results for input(s): IRON, TIBC, TRANSFERRIN, FERRITIN in the last 72 hours. Studies/Results: DG Chest Portable 1 View  Result Date: 01/13/2020 CLINICAL DATA:  59 year old male with shortness of breath. EXAM: PORTABLE CHEST 1 VIEW COMPARISON:  Chest radiograph dated 12/21/2019 and CT dated 12/23/2019 FINDINGS: There is moderate enlargement of the cardiopericardial silhouette similar  to the radiograph of 12/21/2019. There is central vascular prominence consistent with congestion. Trace bilateral pleural effusions noted. There are bibasilar atelectatic changes. No lobar consolidation or pneumothorax. Atherosclerotic calcification of the aorta and branches of the aortic arch. No acute osseous pathology. IMPRESSION: 1. Cardiomegaly with vascular congestion and possible mild edema. Overall slight interval worsening of the congestion since the radiograph of 12/21/2019. 2. Advanced atherosclerotic calcification of the aorta and branches of the aortic arch. Electronically Signed   By: Anner Crete M.D.   On: 01/13/2020 16:32    Medications: Infusions:   Scheduled Medications: . cefdinir  300 mg Oral Daily  .  Chlorhexidine Gluconate Cloth  6 each Topical Q0600  . feeding supplement (NEPRO CARB STEADY)  237 mL Oral BID BM  . heparin injection (subcutaneous)  5,000 Units Subcutaneous Q8H  . ondansetron (ZOFRAN) IV  4 mg Intravenous Once  . pantoprazole  40 mg Oral Daily  . sodium chloride flush  3 mL Intravenous Once    have reviewed scheduled and prn medications.  Physical Exam: General:NAD, comfortable Heart:RRR, s1s2 nl Lungs:clear b/l, no crackle Abdomen:soft, Non-tender, distended Extremities:No edema Dialysis Access: AV graft.  Dara Beidleman Prasad Jeral Zick 01/14/2020,8:44 AM  LOS: 1 day  Pager: [phone]

## 2020-01-14 NOTE — ED Triage Notes (Signed)
Pt bib [facility]. [facility] reports pt went to HD today and stopped about 2 hours in d/t itching. Pt called for SOB. GEMS found lungs to be clear and pt refused care walking back to his apartment. Then pt called again for SOB. [facility] noted pt hr in 160's after pt walked back and forth to apartment. Pt with known afib now between 80-90 hr, with DOE. Pt does not use oxygen at home and was satting at 95% on RA, but wished to be on 6lpm as he felt comfortable. CBG 124

## 2020-01-14 NOTE — H&P (Addendum)
History and Physical    [name] DJM:[id number] DOB: 1961-04-30 DOA: 01/14/2020  PCP: [name], FNP  Patient coming from: Home   Chief Complaint:  Chief Complaint  Patient presents with  . Shortness of Breath  . Pruritis     HPI:    59 year old male with past medical history of end-stage renal disease (MWF schedule), coronary artery disease (last cath 2018 S/P DES to RCA pulmonary hypertension with cor pulmonale, cirrhosis (Multifactorial due to EtOH abuse, HepB and Hep C, requires frequent large volume taps), paroxysmal atrial fibrillation, hypertension, COPD, nicotine dependence who presents to [facility] emergency department with complaints of palpitations.  Patient is a poor historian and becomes extremely angry with inquiries from the medical providers.  History is unfortunately limited secondary to this.  Patient reports that he was recently discharged from the hospital the morning of 7/5.  Unfortunately there is no discharge summary as of yet concerning this hospitalization however patient was placed in the intensive care unit for emergent dialysis due to severe hyperkalemia and bradycardia.  Patient did receive hemodialysis on 7/4.  Patient was discharged this morning and underwent outpatient dialysis afterwards.  Patient reports that he went home after dialysis and while at home he experienced several episodes of severe palpitations.  Patient states that his blood pressure cuff stated that his heart rate was in the 160s.  Patient complains of some associated shortness of breath but denies cough different than his baseline.  Patient denies fevers or sick contacts.  Patient presented to [facility] emergency department for evaluation.  Upon evaluation in the emergency department patient was found to be hypoxic with oxygen saturations in the 80s requiring supplemental oxygen with nasal cannula.  Chest x-ray did reveal possible developing infiltrate  of the left lung base.  Telemetry and EKG revealed controlled atrial fibrillation on arrival.  CT angiogram of the chest and ABG were ordered.  The hospitalist group was then called to assess patient for admission the hospital.    Review of Systems: Unable to fully obtain due to patient being a poor historian and not forthcoming with history.  Patient did complain of the followiong:  GI : lower abdominal pain, 10/10 in intensity, chronic for at least several months  Past Medical History:  Diagnosis Date  . Anemia   . Anxiety   . Arthritis    left shoulder  . Atherosclerosis of aorta ([place])   . Cardiomegaly   . Chest pain    DATE UNKNOWN, C/O PERIODICALLY  . Cocaine abuse ([place])   . COPD exacerbation ([place]) 08/17/2016  . Coronary artery disease    stent 02/22/17  . ESRD (end stage renal disease) on dialysis ([place])    "[name]; MWF" (07/04/2017)  . GERD (gastroesophageal reflux disease)    DATE UNKNOWN  . Hemorrhoids   . Hepatitis B, chronic ([place])   . Hepatitis C   . History of kidney stones   . Hyperkalemia   . Hypertension   . Kidney failure   . Metabolic bone disease    Patient denies  . Mitral stenosis   . Myocardial infarction ([place])   . Pneumonia   . Pulmonary edema   . Solitary rectal ulcer syndrome 07/2017   at flex sig for rectal bleeding  . Tubular adenoma of colon     Past Surgical History:  Procedure Laterality Date  . A/V FISTULAGRAM Left 05/26/2017   Procedure: A/V FISTULAGRAM;  Surgeon: Conrad Cecilia, MD;  Location: [place] [facility];  Service: Cardiovascular;  Laterality: Left;  . A/V FISTULAGRAM Right 11/18/2017   Procedure: A/V FISTULAGRAM - Right Arm;  Surgeon: Elam Dutch, MD;  Location: [place] [facility];  Service: Cardiovascular;  Laterality: Right;  . APPLICATION OF WOUND VAC Left 06/14/2017   Procedure: APPLICATION OF WOUND VAC;  Surgeon: Katha Cabal, MD;  Location: [facility] ORS;  Service: Vascular;  Laterality: Left;  . AV FISTULA  PLACEMENT  2012   BELIEVED WAS PLACED IN JUNE  . AV FISTULA PLACEMENT Right 08/09/2017   Procedure: Creation Right arm ARTERIOVENOUS BRACHIOCEPOHALIC FISTULA;  Surgeon: Elam Dutch, MD;  Location: [facility] OR;  Service: Vascular;  Laterality: Right;  . AV FISTULA PLACEMENT Right 11/22/2017   Procedure: INSERTION OF ARTERIOVENOUS (AV) GORE-TEX GRAFT RIGHT UPPER ARM;  Surgeon: Elam Dutch, MD;  Location: [place];  Service: Vascular;  Laterality: Right;  . BIOPSY  01/25/2018   Procedure: BIOPSY;  Surgeon: Jerene Bears, MD;  Location: [place];  Service: Gastroenterology;;  . BIOPSY  04/10/2019   Procedure: BIOPSY;  Surgeon: Jerene Bears, MD;  Location: [facility];  Service: Gastroenterology;;  . COLONOSCOPY    . COLONOSCOPY WITH PROPOFOL N/A 01/25/2018   Procedure: COLONOSCOPY WITH PROPOFOL;  Surgeon: Jerene Bears, MD;  Location: [place];  Service: Gastroenterology;  Laterality: N/A;  . CORONARY STENT INTERVENTION N/A 02/22/2017   Procedure: CORONARY STENT INTERVENTION;  Surgeon: Nigel Mormon, MD;  Location: [place] [facility];  Service: Cardiovascular;  Laterality: N/A;  . ESOPHAGOGASTRODUODENOSCOPY (EGD) WITH PROPOFOL N/A 01/25/2018   Procedure: ESOPHAGOGASTRODUODENOSCOPY (EGD) WITH PROPOFOL;  Surgeon: Jerene Bears, MD;  Location: [place];  Service: Gastroenterology;  Laterality: N/A;  . ESOPHAGOGASTRODUODENOSCOPY (EGD) WITH PROPOFOL N/A 04/10/2019   Procedure: ESOPHAGOGASTRODUODENOSCOPY (EGD) WITH PROPOFOL;  Surgeon: Jerene Bears, MD;  Location: [facility];  Service: Gastroenterology;  Laterality: N/A;  . FLEXIBLE SIGMOIDOSCOPY N/A 07/15/2017   Procedure: FLEXIBLE SIGMOIDOSCOPY;  Surgeon: Carol Ada, MD;  Location: [place];  Service: Endoscopy;  Laterality: N/A;  . HEMORRHOID BANDING    . I & D EXTREMITY Left 06/01/2017   Procedure: IRRIGATION AND DEBRIDEMENT LEFT ARM HEMATOMA WITH LIGATION OF LEFT ARM AV FISTULA;  Surgeon: Elam Dutch, MD;  Location: [place];  Service: Vascular;  Laterality: Left;  . I & D EXTREMITY Left 06/14/2017   Procedure: IRRIGATION AND DEBRIDEMENT EXTREMITY;  Surgeon: Katha Cabal, MD;  Location: [facility];  Service: Vascular;  Laterality: Left;  . INSERTION OF DIALYSIS CATHETER  05/30/2017  . INSERTION OF DIALYSIS CATHETER N/A 05/30/2017   Procedure: INSERTION OF DIALYSIS CATHETER;  Surgeon: Elam Dutch, MD;  Location: [place];  Service: Vascular;  Laterality: N/A;  . IR PARACENTESIS  08/30/2017  . IR PARACENTESIS  09/29/2017  . IR PARACENTESIS  10/28/2017  . IR PARACENTESIS  11/09/2017  . IR PARACENTESIS  11/16/2017  . IR PARACENTESIS  11/28/2017  . IR PARACENTESIS  12/01/2017  . IR PARACENTESIS  12/06/2017  . IR PARACENTESIS  01/03/2018  . IR PARACENTESIS  01/23/2018  . IR PARACENTESIS  02/07/2018  . IR PARACENTESIS  02/21/2018  . IR PARACENTESIS  03/06/2018  . IR PARACENTESIS  03/17/2018  . IR PARACENTESIS  04/04/2018  . IR PARACENTESIS  12/28/2018  . IR PARACENTESIS  01/08/2019  . IR PARACENTESIS  01/23/2019  . IR PARACENTESIS  02/01/2019  . IR PARACENTESIS  02/19/2019  . IR PARACENTESIS  03/01/2019  . IR PARACENTESIS  03/15/2019  . IR PARACENTESIS  04/03/2019  . IR PARACENTESIS  04/12/2019  . IR PARACENTESIS  05/01/2019  . IR PARACENTESIS  05/08/2019  . IR PARACENTESIS  05/24/2019  . IR PARACENTESIS  06/12/2019  . IR PARACENTESIS  07/09/2019  . IR PARACENTESIS  07/27/2019  . IR PARACENTESIS  08/09/2019  . IR PARACENTESIS  08/21/2019  . IR PARACENTESIS  09/17/2019  . IR PARACENTESIS  10/05/2019  . IR PARACENTESIS  10/29/2019  . IR PARACENTESIS  11/08/2019  . IR PARACENTESIS  12/12/2019  . IR PARACENTESIS  01/03/2020  . IR PARACENTESIS  01/10/2020  . IR RADIOLOGIST EVAL & MGMT  02/14/2018  . IR RADIOLOGIST EVAL & MGMT  02/22/2019  . LEFT HEART CATH AND CORONARY ANGIOGRAPHY N/A 02/22/2017   Procedure: LEFT HEART CATH AND CORONARY ANGIOGRAPHY;  Surgeon: Nigel Mormon, MD;  Location: [place] [facility];  Service:  Cardiovascular;  Laterality: N/A;  . LIGATION OF ARTERIOVENOUS  FISTULA Left 01/15/8241   Procedure: Plication of Left Arm Arteriovenous Fistula;  Surgeon: Elam Dutch, MD;  Location: [place];  Service: Vascular;  Laterality: Left;  . POLYPECTOMY    . POLYPECTOMY  01/25/2018   Procedure: POLYPECTOMY;  Surgeon: Jerene Bears, MD;  Location: [place];  Service: Gastroenterology;;  . REVISON OF ARTERIOVENOUS FISTULA Left 3/53/6144   Procedure: PLICATION OF DISTAL ANEURYSMAL SEGEMENT OF LEFT UPPER ARM ARTERIOVENOUS FISTULA;  Surgeon: Elam Dutch, MD;  Location: [place];  Service: Vascular;  Laterality: Left;  . REVISON OF ARTERIOVENOUS FISTULA Left 09/24/4006   Procedure: Plication of Left Upper Arm Fistula ;  Surgeon: Waynetta Sandy, MD;  Location: [place];  Service: Vascular;  Laterality: Left;  . SKIN GRAFT SPLIT THICKNESS LEG / FOOT Left    SKIN GRAFT SPLIT THICKNESS LEFT ARM DONOR SITE: LEFT ANTERIOR THIGH  . SKIN SPLIT GRAFT Left 07/04/2017   Procedure: SKIN GRAFT SPLIT THICKNESS LEFT ARM DONOR SITE: LEFT ANTERIOR THIGH;  Surgeon: Elam Dutch, MD;  Location: [place];  Service: Vascular;  Laterality: Left;  . THROMBECTOMY W/ EMBOLECTOMY Left 06/05/2017   Procedure: EXPLORATION OF LEFT ARM FOR BLEEDING; OVERSEWED PROXIMAL FISTULA;  Surgeon: Angelia Mould, MD;  Location: [place];  Service: Vascular;  Laterality: Left;  . WOUND EXPLORATION Left 06/03/2017   Procedure: WOUND EXPLORATION WITH WOUND VAC APPLICATION TO LEFT ARM;  Surgeon: Angelia Mould, MD;  Location: [place];  Service: Vascular;  Laterality: Left;     reports that he has been smoking cigarettes. He started smoking about 46 years ago. He has a 21.50 pack-year smoking history. He has never used smokeless tobacco. He reports previous alcohol use. He reports previous drug use. Drugs: Marijuana and Cocaine.  Allergies  Allergen Reactions  . Tramadol Itching and Other (See Comments)  . Morphine And  Related Other (See Comments)    Stomach pain  . Pollen Extract     Other reaction(s): Sneezing (finding)  . Acetaminophen Nausea Only    Stomach ache Other reaction(s): Other (See Comments) Stomach ache  . Aspirin Other (See Comments) and Itching    STOMACH PAIN Other reaction(s): Unknown STOMACH PAIN  . Clonidine Derivatives Itching    Family History  Problem Relation Age of Onset  . Heart disease Mother   . Lung cancer Mother   . Heart disease Father   . Malignant hyperthermia Father   . COPD Father   . Throat cancer Sister   . Esophageal cancer Sister   .  Hypertension Other   . COPD Other   . Colon cancer Neg Hx   . Colon polyps Neg Hx   . Rectal cancer Neg Hx   . Stomach cancer Neg Hx      Prior to Admission medications   Medication Sig Start Date End Date Taking? Authorizing Provider  hydrALAZINE (APRESOLINE) 100 MG tablet Take 100 mg by mouth 2 (two) times daily.   Yes [provider]  hydrOXYzine (ATARAX/VISTARIL) 25 MG tablet Take 1 tablet (25 mg total) by mouth 2 (two) times daily as needed for itching. 01/02/20  Yes Adrian Prows, MD  sevelamer carbonate (RENVELA) 800 MG tablet Take 1,600-3,200 mg by mouth See admin instructions. Take 4 tablets (3200 mg) by mouth 3 times daily with meals and 2 tablets (1600 mg) with snacks   Yes [provider]  cefdinir (OMNICEF) 300 MG capsule Take 1 capsule (300 mg total) by mouth daily. 01/14/20   Candee Furbish, MD  gabapentin (NEURONTIN) 100 MG capsule Take 1 capsule (100 mg total) by mouth 3 (three) times daily for 90 doses. 12/27/19 01/26/20  Adrian Prows, MD  lactulose (CHRONULAC) 10 GM/15ML solution Take 45 mLs (30 g total) by mouth daily as needed for mild constipation. 10/01/19   [name], MD  metoprolol tartrate (LOPRESSOR) 100 MG tablet Take 1 tablet (100 mg total) by mouth 2 (two) times daily. 06/04/19   Al Decant, MD  ondansetron (ZOFRAN) 4 MG tablet Take 4 mg by mouth every 4 (four) hours as  needed for nausea or vomiting.  09/18/19   [provider]  oxyCODONE 10 MG TABS Take 1 tablet (10 mg total) by mouth 3 (three) times daily as needed for up to 14 days for severe pain. 01/14/20 01/28/20  Candee Furbish, MD  pantoprazole (PROTONIX) 40 MG tablet Take 1 tablet (40 mg total) by mouth daily before breakfast. 12/27/19   Adrian Prows, MD  sodium polystyrene (KAYEXALATE) 15 GM/60ML suspension Take 120 mLs (30 g total) by mouth daily as needed (constipation). 01/14/20   Candee Furbish, MD    Physical Exam: Vitals:   01/14/20 1753 01/14/20 1808 01/14/20 1833 01/14/20 1834  BP:  129/64  (!) 144/59  Pulse:  (!) 58  64  Resp:  (!) 25  17  SpO2:  (!) 86% 97%   Weight: 61.2 kg       Constitutional: Acute alert and oriented x3, patient is in mild distress due to abdominal pain. Skin: no rashes, no lesions, good skin turgor noted. Eyes: Pupils are equally reactive to light.  No evidence of scleral icterus or conjunctival pallor.  ENMT: Moist mucous membranes noted.  Posterior pharynx clear of any exudate or lesions.   Neck: normal, supple, no masses, no thyromegaly.  No evidence of jugular venous distension.   Respiratory: Expiratory wheezing noted in all fields with very faint bibasilar rales, slightly prolonged aspiratory phase,  Normal respiratory effort. No accessory muscle use.  Cardiovascular: Irregularly irregular rhythm with controlled rate.  3/6 systolic murmur noted. .  Trace distal bilateral lower extremity pitting edema.  2+ pedal pulses. No carotid bruits.  Chest:   Nontender without crepitus or deformity.   Back:   Nontender without crepitus or deformity. Abdomen: Diffuse abdominal tenderness.  Abdomen is somewhat protuberant . No evidence of intra-abdominal masses.  Positive bowel sounds noted in all quadrants.   Musculoskeletal: No joint deformity upper and lower extremities. Good ROM, no contractures. Normal muscle tone.  Neurologic: CN 2-12  grossly intact. Sensation  intact, strength noted to be 5 out of 5 in all 4 extremities.  Patient is following all commands.  Patient is responsive to verbal stimuli.   Psychiatric: Patient presents as a angry mood with somewhat labile affect.  Patient seems to possess insight as to theircurrent situation.     Labs on Admission: I have personally reviewed following labs and imaging studies -   CBC: Recent Labs  Lab 01/13/20 1557 01/13/20 1559 01/14/20 0023 01/14/20 1800  WBC  --  12.8* 12.9* 11.0*  NEUTROABS  --  10.1*  --  9.0*  HGB 12.9* 12.0* 11.0* 10.5*  HCT 38.0* 35.5* 31.6* 31.0*  MCV  --  81.6 79.4* 81.2  PLT  --  283 196 233   Basic Metabolic Panel: Recent Labs  Lab 01/13/20 1557 01/13/20 1559 01/14/20 0023 01/14/20 1800  NA 124* 127* 132* 133*  K 6.6* 7.2* 4.2 4.1  CL 93* 90* 95* 91*  CO2  --  21* 24 27  GLUCOSE 122* 118* 124* 97  BUN 38* 38* 15 19  CREATININE 6.40* 5.77* 3.15* 3.32*  CALCIUM  --  9.4 9.0 9.0  MG  --  2.2 2.0  --   PHOS  --   --  3.9  --    GFR: Estimated Creatinine Clearance: 21.3 mL/min (A) (by C-G formula based on SCr of 3.32 mg/dL (H)). Liver Function Tests: Recent Labs  Lab 01/13/20 1559  AST 54*  ALT 39  ALKPHOS 314*  BILITOT 1.1  PROT 6.8  ALBUMIN 2.6*   Recent Labs  Lab 01/13/20 1559  LIPASE 27   No results for input(s): AMMONIA in the last 168 hours. Coagulation Profile: Recent Labs  Lab 01/13/20 1559  INR 1.2   Cardiac Enzymes: No results for input(s): CKTOTAL, CKMB, CKMBINDEX, TROPONINI in the last 168 hours. BNP (last 3 results) No results for input(s): PROBNP in the last 8760 hours. HbA1C: No results for input(s): HGBA1C in the last 72 hours. CBG: Recent Labs  Lab 01/13/20 1545 01/13/20 1851 01/14/20 0023 01/14/20 0730  GLUCAP 139* 64* 122* 120*   Lipid Profile: No results for input(s): CHOL, HDL, LDLCALC, TRIG, CHOLHDL, LDLDIRECT in the last 72 hours. Thyroid Function Tests: No results for input(s): TSH, T4TOTAL, FREET4,  T3FREE, THYROIDAB in the last 72 hours. Anemia Panel: No results for input(s): VITAMINB12, FOLATE, FERRITIN, TIBC, IRON, RETICCTPCT in the last 72 hours. Urine analysis:    Component Value Date/Time   COLORURINE YELLOW 07/16/2011 1619   APPEARANCEUR CLEAR 07/16/2011 1619   LABSPEC 1.018 07/16/2011 1619   PHURINE 7.5 07/16/2011 1619   GLUCOSEU 250 (A) 07/16/2011 1619   HGBUR MODERATE (A) 07/16/2011 1619   BILIRUBINUR SMALL (A) 07/16/2011 1619   KETONESUR NEGATIVE 07/16/2011 1619   PROTEINUR >300 (A) 07/16/2011 1619   UROBILINOGEN 1.0 07/16/2011 1619   NITRITE NEGATIVE 07/16/2011 1619   LEUKOCYTESUR SMALL (A) 07/16/2011 1619    Radiological Exams on Admission - Personally Reviewed: DG Chest Portable 1 View  Result Date: 01/14/2020 CLINICAL DATA:  Shortness of breath. EXAM: PORTABLE CHEST 1 VIEW COMPARISON:  January 13, 2020 FINDINGS: Mild, diffuse, chronic appearing increased lung markings are seen. There is decreased vascular congestion since the prior study. Mild, hazy areas of atelectasis and/or early infiltrate are seen along the periphery of the mid left lung. There is no evidence of a pleural effusion or pneumothorax. The cardiac silhouette is markedly enlarged. There is mild calcification of the aortic arch. The visualized  skeletal structures are unremarkable. IMPRESSION: 1. Mild, hazy areas of atelectasis and/or early infiltrate along the periphery of the mid left lung. 2. Decreased pulmonary vascular congestion when compared to the prior exam. Electronically Signed   By: Virgina Norfolk M.D.   On: 01/14/2020 18:29   DG Chest Portable 1 View  Result Date: 01/13/2020 CLINICAL DATA:  59 year old male with shortness of breath. EXAM: PORTABLE CHEST 1 VIEW COMPARISON:  Chest radiograph dated 12/21/2019 and CT dated 12/23/2019 FINDINGS: There is moderate enlargement of the cardiopericardial silhouette similar to the radiograph of 12/21/2019. There is central vascular prominence consistent with  congestion. Trace bilateral pleural effusions noted. There are bibasilar atelectatic changes. No lobar consolidation or pneumothorax. Atherosclerotic calcification of the aorta and branches of the aortic arch. No acute osseous pathology. IMPRESSION: 1. Cardiomegaly with vascular congestion and possible mild edema. Overall slight interval worsening of the congestion since the radiograph of 12/21/2019. 2. Advanced atherosclerotic calcification of the aorta and branches of the aortic arch. Electronically Signed   By: Anner Crete M.D.   On: 01/13/2020 16:32    EKG: Personally reviewed.  Rhythm is atrial fibrillation with heart rate of 84 bpm.  QTC noted to be 557.  Appreciated.  No dynamic ST segment changes appreciated.  Assessment/Plan Principal Problem:   Acute respiratory failure with hypoxia [facility])   Patient presenting to the emergency department with evidence of hypoxia requiring supplemental oxygen.  Hypoxia is of unknown etiology  Chest x-ray reveals a possible developing left lower lobe infiltrate and also possesses a mild leukocytosis although considering patient's intermittent bouts with volume overload I am not convinced that this is pneumonia.  It is possible that patient has developed some degree of mild pulmonary edema status post a bout of rapid atrial fibrillation at home considering patient's reports of heart rates in excess of 160 and severe palpitations prior to arrival.  I have advised the emergency department provider to order ABG and CT angiogram of the chest to evaluate for any further evidence of pneumonia, edema or pulmonary embolism.  Further treatment will be provided based on these results.  Clinically, patient is exhibiting substantial wheezing and is possibly suffering from a mild COPD exacerbation.  Solu-Medrol 40 mg IV every 12 is been initiated along with scheduled bronchodilator therapy.  Continue to provide supplemental oxygen  Paroxysmal atrial fibrillation  with rapid ventricular response   Patient reports bouts of severe palpitation heart rates in excess of 160 at home.  Patient is a known history of paroxysmal atrial fibrillation and therefore a bout of rapid ventricular response is suspected.  We have observed controlled atrial fibrillation upon arrival  We will monitor patient on telemetry, continue patient's home regimen of metoprolol 100 mg twice daily  Troponin was ordered by the emergency department staff.  Patient is chest pain-free and I do not believe that this is secondary to plaque rupture.  We will follow up on troponin.  Active Problems:   COPD exacerbation ([place])   Suspected mild COPD exacerbation due to expiratory wheezing on examination with slightly prolonged expiratory phase  Provided patient with supplemental oxygen for concurrent hypoxia  ABG ordered by emergency department provider  Following up on CT imaging of the chest to evaluate for concurrent infectious process or other etiologies  Intravenous Solu-Medrol with aggressive bronchodilator therapy    Abdominal pain   Patient complains of 10 out of 10 abdominal pain and has a known history of SBP in the recent past  However, patient also  states that he "always has a 10" and states that this pain has been going on for at least the past several months  Patient typically gets regular large-volume paracenteses.  Patient indeed does have diffuse abdominal tenderness on examination  Will obtain ultrasound of the abdomen for ascites survey, consider paracentesis with cell count and culture if there is enough fluid to tap.    ESRD on dialysis [facility])   We will contact nephrology in the morning to evaluate for continued hemodialysis while hospitalized if necessary  No clinical need for emergent hemodialysis at this time  Patient undergoes Monday Wednesday Friday schedule of hemodialysis    Coronary artery disease involving native coronary artery of native heart  without angina pectoris   Currently chest pain-free  Will follow up on troponin ordered by emergency department provider although clinically this does not seem to be plaque rupture  EKG reveals atrial fibrillation with left bundle branch block    Nicotine dependence, cigarettes, uncomplicated   Counseling patient on cessation but patient is not receptive  Patient declines nicotine replacement therapy at this time    Cirrhosis of liver with ascites ([place])   Multifactorial cirrhosis secondary to remote history of alcohol abuse along with history of hepatitis B and C  Obtaining ascites survey as noted above  Continue outpatient follow-up    Code Status:  Full Code Family Communication: Deferred  Status is: Observation  The patient remains OBS appropriate and will d/c before 2 midnights.  Dispo: The patient is from: Home              Anticipated d/c is to: Home              Anticipated d/c date is: 2 days              Patient currently is not medically stable to d/c.        Vernelle Emerald MD [facility] Pager [phone]  If 7PM-7AM, please contact night-coverage [email or web address] Use universal [place] password for that web site. If you do not have the password, please call the hospital operator.  01/14/2020, 8:21 PM

## 2020-01-14 NOTE — Progress Notes (Signed)
   01/13/20 2305  Hand-Off documentation  Handoff Given Given to shift RN/LPN  Report given to (Full Name) Sandford Craze, RN  Handoff Received Received from shift RN/LPN  Report received from (Full Name) Henorck Obas, RN  Vital Signs  Temp 98 F (36.7 C)  Temp Source Oral  Pulse Rate 76  Resp 18  BP 119/60  BP Location Left Arm  BP Method Automatic  Patient Position (if appropriate) Lying  Oxygen Therapy  SpO2 94 %  O2 Device Nasal Cannula  O2 Flow Rate (L/min) 3 L/min  Pain Assessment  Pain Scale 0-10  Pain Score 0  Post-Hemodialysis Assessment  Rinseback Volume (mL) 250 mL  KECN 294 V  Dialyzer Clearance Lightly streaked  Duration of HD Treatment -hour(s) 3 hour(s)  Hemodialysis Intake (mL) 500 mL  UF Total -Machine (mL) 2481 mL  Net UF (mL) 1981 mL  Tolerated HD Treatment Yes  Post-Hemodialysis Comments pt tolerated the HD tx very well, no complaints.  AVG/AVF Arterial Site Held (minutes) 10 minutes  AVG/AVF Venous Site Held (minutes) 10 minutes  Education / Care Plan  Dialysis Education Provided Yes  Note  Observations pt is stable.

## 2020-01-14 NOTE — Plan of Care (Signed)

## 2020-01-14 NOTE — ED Notes (Signed)
EDP at beside, holding meds for now.

## 2020-01-14 NOTE — Progress Notes (Signed)
eLink Physician-Brief Progress Note Patient Name: [name] DOB: 06-30-1961 MRN: [id number]   Date of Service  01/14/2020  HPI/Events of Note  Patient requesting an IV pain medication instead of oxycodone for which he is ordered.  Patient has diarrhea which he reports he typically gets following dialysis on HD days. Would like Lomotil.   eICU Interventions  Ordered dilaudid 0.5mg  IV x1 dose and Lomotil PRN.     Intervention Category Intermediate Interventions: Pain - evaluation and management Minor Interventions: Other:  Charlott Rakes 01/14/2020, 3:20 AM

## 2020-01-14 NOTE — ED Provider Notes (Signed)
[facility] EMERGENCY DEPARTMENT Provider Note   CSN: [id number] Arrival date & time: 01/14/20  1735     History Chief Complaint  Patient presents with  . Shortness of Breath  . Pruritis    [name] is a 59 y.o. male.  Patient discharged from the hospital earlier today after treatment for increased potassium.  Went to his home dialysis this afternoon.  Developed shortness of breath while there.  Patient got 2 hours of dialysis.  He has symptoms mostly with exertion.  He denies any specific cough or sputum production.  No specific chest pain.  History of cirrhosis.  Had paracentesis several days ago.  Supposedly is on antibiotics.  The history is provided by the patient.  Shortness of Breath Severity:  Moderate Onset quality:  Gradual Timing:  Constant Chronicity:  Recurrent Relieved by:  Nothing Worsened by:  Activity Associated symptoms: chest pain   Associated symptoms: no abdominal pain, no cough, no ear pain, no fever, no rash, no sore throat and no vomiting   Risk factors: no hx of PE/DVT        Past Medical History:  Diagnosis Date  . Anemia   . Anxiety   . Arthritis    left shoulder  . Atherosclerosis of aorta ([place])   . Cardiomegaly   . Chest pain    DATE UNKNOWN, C/O PERIODICALLY  . Cocaine abuse ([place])   . COPD exacerbation ([place]) 08/17/2016  . Coronary artery disease    stent 02/22/17  . ESRD (end stage renal disease) on dialysis ([place])    "[name]; MWF" (07/04/2017)  . GERD (gastroesophageal reflux disease)    DATE UNKNOWN  . Hemorrhoids   . Hepatitis B, chronic ([place])   . Hepatitis C   . History of kidney stones   . Hyperkalemia   . Hypertension   . Kidney failure   . Metabolic bone disease    Patient denies  . Mitral stenosis   . Myocardial infarction ([place])   . Pneumonia   . Pulmonary edema   . Solitary rectal ulcer syndrome 07/2017   at flex sig for rectal bleeding  . Tubular adenoma of colon     Patient  Active Problem List   Diagnosis Date Noted  . Acute pulmonary edema ([place]) 12/21/2019  . Acetaminophen overdose 10/27/2019  . ESRD (end stage renal disease) ([place])   . Goals of care, counseling/discussion   . Hypoxia 10/04/2019  . Advanced care planning/counseling discussion   . Renal failure 09/26/2019  . Dyspnea 06/09/2019  . Acquired thrombophilia ([place]) 06/05/2019  . A-fib ([facility]) 05/30/2019  . Atrial fibrillation with RVR ([place]) 05/29/2019  . Melena   . Pressure injury of skin 03/09/2019  . Abdominal distention   . Volume overload 12/28/2018  . Sepsis ([place]) 09/12/2018  . Atherosclerosis of native coronary artery of native heart without angina pectoris 03/11/2018  . Benign neoplasm of cecum   . Benign neoplasm of ascending colon   . Benign neoplasm of descending colon   . Benign neoplasm of rectum   . Paroxysmal atrial fibrillation ([place]) 01/23/2018  . Hx of colonic polyps 01/20/2018  . End-stage renal disease on hemodialysis ([place]) 11/21/2017  . GERD (gastroesophageal reflux disease) 11/16/2017  . Decompensated hepatic cirrhosis ([place]) 11/15/2017  . DNR (do not resuscitate)   . Palliative care by specialist   . Hyponatremia 11/04/2017  . SBP (spontaneous bacterial peritonitis) ([place]) 10/30/2017  . Liver disease, chronic 10/30/2017  . SOB (shortness of  breath)   . Abdominal pain 10/28/2017  . Upper airway cough syndrome with flattening on f/v loop 10/13/17 c/w vcd 10/17/2017  . Elevated diaphragm 10/13/2017  . Ileus ([place]) 09/29/2017  . QT prolongation 09/29/2017  . Malnutrition of moderate degree 09/29/2017  . Sinus congestion 09/03/2017  . Symptomatic anemia 09/02/2017  . Other cirrhosis of liver ([place]) 09/02/2017  . Left bundle branch block 09/02/2017  . Mitral stenosis 09/02/2017  . Hematochezia 07/15/2017  . Wide-complex tachycardia ([place])   . Endotracheally intubated   . ESRD on dialysis ([place]) 07/04/2017  . Acute respiratory failure with hypoxia ([place]) 06/18/2017  . CKD  (chronic kidney disease) stage V requiring chronic dialysis ([place]) 06/18/2017  . History of Cocaine abuse ([place]) 06/18/2017  . Hypertension 06/18/2017  . Infection of AV graft for dialysis ([place]) 06/18/2017  . Anxiety 06/18/2017  . Anemia due to chronic kidney disease 06/18/2017  . Atypical atrial flutter ([place]) 06/18/2017  . Personality disorder ([place]) 06/13/2017  . Cellulitis 06/12/2017  . Adjustment disorder with mixed anxiety and depressed mood 06/10/2017  . Suicidal ideation 06/10/2017  . Arm wound, left, sequela 06/10/2017  . Dyspnea on exertion 05/29/2017  . Tachycardia 05/29/2017  . Hyperkalemia 05/22/2017  . Acute metabolic encephalopathy   . Anemia 04/23/2017  . Ascites 04/23/2017  . COPD (chronic obstructive pulmonary disease) ([place]) 04/23/2017  . Acute on chronic respiratory failure with hypoxia ([place]) 03/25/2017  . Arrhythmia 03/25/2017  . COPD GOLD 0 with flattening on inps f/v  09/27/2016  . Essential hypertension 09/27/2016  . Fluid overload 08/30/2016  . COPD exacerbation ([place]) 08/17/2016  . Hypertensive urgency 08/17/2016  . Respiratory failure ([place]) 08/17/2016  . Problem with dialysis access ([place]) 07/23/2016  . Chronic hepatitis B ([place]) 03/05/2014  . Chronic hepatitis C without hepatic coma ([place]) 03/05/2014  . Internal hemorrhoids with bleeding, swelling and itching 03/05/2014  . Thrombocytopenia ([place]) 03/05/2014  . Chest pain 02/27/2014  . Alcohol abuse 04/14/2009  . Cigarette smoker 04/14/2009  . GANGLION CYST 04/14/2009    Past Surgical History:  Procedure Laterality Date  . A/V FISTULAGRAM Left 05/26/2017   Procedure: A/V FISTULAGRAM;  Surgeon: Conrad [place], MD;  Location: [facility];  Service: Cardiovascular;  Laterality: Left;  . A/V FISTULAGRAM Right 11/18/2017   Procedure: A/V FISTULAGRAM - Right Arm;  Surgeon: Elam Dutch, MD;  Location: [facility];  Service: Cardiovascular;  Laterality: Right;  . APPLICATION OF WOUND VAC Left  06/14/2017   Procedure: APPLICATION OF WOUND VAC;  Surgeon: Katha Cabal, MD;  Location: [facility];  Service: Vascular;  Laterality: Left;  . AV FISTULA PLACEMENT  2012   BELIEVED WAS PLACED IN JUNE  . AV FISTULA PLACEMENT Right 08/09/2017   Procedure: Creation Right arm ARTERIOVENOUS BRACHIOCEPOHALIC FISTULA;  Surgeon: Elam Dutch, MD;  Location: [facility] OR;  Service: Vascular;  Laterality: Right;  . AV FISTULA PLACEMENT Right 11/22/2017   Procedure: INSERTION OF ARTERIOVENOUS (AV) GORE-TEX GRAFT RIGHT UPPER ARM;  Surgeon: Elam Dutch, MD;  Location: [facility];  Service: Vascular;  Laterality: Right;  . BIOPSY  01/25/2018   Procedure: BIOPSY;  Surgeon: Jerene Bears, MD;  Location: [facility];  Service: Gastroenterology;;  . BIOPSY  04/10/2019   Procedure: BIOPSY;  Surgeon: Jerene Bears, MD;  Location: [facility];  Service: Gastroenterology;;  . COLONOSCOPY    . COLONOSCOPY WITH PROPOFOL N/A 01/25/2018   Procedure: COLONOSCOPY WITH PROPOFOL;  Surgeon: Jerene Bears, MD;  Location: [facility]  ENDOSCOPY;  Service: Gastroenterology;  Laterality: N/A;  . CORONARY STENT INTERVENTION N/A 02/22/2017   Procedure: CORONARY STENT INTERVENTION;  Surgeon: Nigel Mormon, MD;  Location: [facility];  Service: Cardiovascular;  Laterality: N/A;  . ESOPHAGOGASTRODUODENOSCOPY (EGD) WITH PROPOFOL N/A 01/25/2018   Procedure: ESOPHAGOGASTRODUODENOSCOPY (EGD) WITH PROPOFOL;  Surgeon: Jerene Bears, MD;  Location: [facility];  Service: Gastroenterology;  Laterality: N/A;  . ESOPHAGOGASTRODUODENOSCOPY (EGD) WITH PROPOFOL N/A 04/10/2019   Procedure: ESOPHAGOGASTRODUODENOSCOPY (EGD) WITH PROPOFOL;  Surgeon: Jerene Bears, MD;  Location: [facility];  Service: Gastroenterology;  Laterality: N/A;  . FLEXIBLE SIGMOIDOSCOPY N/A 07/15/2017   Procedure: FLEXIBLE SIGMOIDOSCOPY;  Surgeon: Carol Ada, MD;  Location: [facility];  Service: Endoscopy;  Laterality: N/A;  . HEMORRHOID BANDING    . I & D EXTREMITY  Left 06/01/2017   Procedure: IRRIGATION AND DEBRIDEMENT LEFT ARM HEMATOMA WITH LIGATION OF LEFT ARM AV FISTULA;  Surgeon: Elam Dutch, MD;  Location: [facility];  Service: Vascular;  Laterality: Left;  . I & D EXTREMITY Left 06/14/2017   Procedure: IRRIGATION AND DEBRIDEMENT EXTREMITY;  Surgeon: Katha Cabal, MD;  Location: [facility];  Service: Vascular;  Laterality: Left;  . INSERTION OF DIALYSIS CATHETER  05/30/2017  . INSERTION OF DIALYSIS CATHETER N/A 05/30/2017   Procedure: INSERTION OF DIALYSIS CATHETER;  Surgeon: Elam Dutch, MD;  Location: [facility];  Service: Vascular;  Laterality: N/A;  . IR PARACENTESIS  08/30/2017  . IR PARACENTESIS  09/29/2017  . IR PARACENTESIS  10/28/2017  . IR PARACENTESIS  11/09/2017  . IR PARACENTESIS  11/16/2017  . IR PARACENTESIS  11/28/2017  . IR PARACENTESIS  12/01/2017  . IR PARACENTESIS  12/06/2017  . IR PARACENTESIS  01/03/2018  . IR PARACENTESIS  01/23/2018  . IR PARACENTESIS  02/07/2018  . IR PARACENTESIS  02/21/2018  . IR PARACENTESIS  03/06/2018  . IR PARACENTESIS  03/17/2018  . IR PARACENTESIS  04/04/2018  . IR PARACENTESIS  12/28/2018  . IR PARACENTESIS  01/08/2019  . IR PARACENTESIS  01/23/2019  . IR PARACENTESIS  02/01/2019  . IR PARACENTESIS  02/19/2019  . IR PARACENTESIS  03/01/2019  . IR PARACENTESIS  03/15/2019  . IR PARACENTESIS  04/03/2019  . IR PARACENTESIS  04/12/2019  . IR PARACENTESIS  05/01/2019  . IR PARACENTESIS  05/08/2019  . IR PARACENTESIS  05/24/2019  . IR PARACENTESIS  06/12/2019  . IR PARACENTESIS  07/09/2019  . IR PARACENTESIS  07/27/2019  . IR PARACENTESIS  08/09/2019  . IR PARACENTESIS  08/21/2019  . IR PARACENTESIS  09/17/2019  . IR PARACENTESIS  10/05/2019  . IR PARACENTESIS  10/29/2019  . IR PARACENTESIS  11/08/2019  . IR PARACENTESIS  12/12/2019  . IR PARACENTESIS  01/03/2020  . IR PARACENTESIS  01/10/2020  . IR RADIOLOGIST EVAL & MGMT  02/14/2018  . IR RADIOLOGIST EVAL & MGMT  02/22/2019  . LEFT HEART CATH AND CORONARY ANGIOGRAPHY  N/A 02/22/2017   Procedure: LEFT HEART CATH AND CORONARY ANGIOGRAPHY;  Surgeon: Nigel Mormon, MD;  Location: [facility];  Service: Cardiovascular;  Laterality: N/A;  . LIGATION OF ARTERIOVENOUS  FISTULA Left 03/20/3569   Procedure: Plication of Left Arm Arteriovenous Fistula;  Surgeon: Elam Dutch, MD;  Location: [facility];  Service: Vascular;  Laterality: Left;  . POLYPECTOMY    . POLYPECTOMY  01/25/2018   Procedure: POLYPECTOMY;  Surgeon: Jerene Bears, MD;  Location: [facility];  Service: Gastroenterology;;  . REVISON OF ARTERIOVENOUS FISTULA  Left 02/03/3663   Procedure: PLICATION OF DISTAL ANEURYSMAL SEGEMENT OF LEFT UPPER ARM ARTERIOVENOUS FISTULA;  Surgeon: Elam Dutch, MD;  Location: [facility];  Service: Vascular;  Laterality: Left;  . REVISON OF ARTERIOVENOUS FISTULA Left 10/12/4740   Procedure: Plication of Left Upper Arm Fistula ;  Surgeon: Waynetta Sandy, MD;  Location: [facility];  Service: Vascular;  Laterality: Left;  . SKIN GRAFT SPLIT THICKNESS LEG / FOOT Left    SKIN GRAFT SPLIT THICKNESS LEFT ARM DONOR SITE: LEFT ANTERIOR THIGH  . SKIN SPLIT GRAFT Left 07/04/2017   Procedure: SKIN GRAFT SPLIT THICKNESS LEFT ARM DONOR SITE: LEFT ANTERIOR THIGH;  Surgeon: Elam Dutch, MD;  Location: [facility];  Service: Vascular;  Laterality: Left;  . THROMBECTOMY W/ EMBOLECTOMY Left 06/05/2017   Procedure: EXPLORATION OF LEFT ARM FOR BLEEDING; OVERSEWED PROXIMAL FISTULA;  Surgeon: Angelia Mould, MD;  Location: [facility];  Service: Vascular;  Laterality: Left;  . WOUND EXPLORATION Left 06/03/2017   Procedure: WOUND EXPLORATION WITH WOUND VAC APPLICATION TO LEFT ARM;  Surgeon: Angelia Mould, MD;  Location: [facility] OR;  Service: Vascular;  Laterality: Left;       Family History  Problem Relation Age of Onset  . Heart disease Mother   . Lung cancer Mother   . Heart disease Father   . Malignant hyperthermia Father   . COPD Father   . Throat cancer Sister   .  Esophageal cancer Sister   . Hypertension Other   . COPD Other   . Colon cancer Neg Hx   . Colon polyps Neg Hx   . Rectal cancer Neg Hx   . Stomach cancer Neg Hx     Social History   Tobacco Use  . Smoking status: Current Every Day Smoker    Packs/day: 0.50    Years: 43.00    Pack years: 21.50    Types: Cigarettes    Start date: 08/13/1973  . Smokeless tobacco: Never Used  . Tobacco comment: i dont know i just make   Vaping Use  . Vaping Use: Never used  Substance Use Topics  . Alcohol use: Not Currently    Comment: quit drinking in 2017  . Drug use: Not Currently    Types: Marijuana, Cocaine    Comment: reports using once every 3 months, 04-06-2019 was this     Home Medications Prior to Admission medications   Medication Sig Start Date End Date Taking? Authorizing Provider  hydrALAZINE (APRESOLINE) 100 MG tablet Take 100 mg by mouth 2 (two) times daily.   Yes [provider]  hydrOXYzine (ATARAX/VISTARIL) 25 MG tablet Take 1 tablet (25 mg total) by mouth 2 (two) times daily as needed for itching. 01/02/20  Yes Adrian Prows, MD  sevelamer carbonate (RENVELA) 800 MG tablet Take 1,600-3,200 mg by mouth See admin instructions. Take 4 tablets (3200 mg) by mouth 3 times daily with meals and 2 tablets (1600 mg) with snacks   Yes [provider]  cefdinir (OMNICEF) 300 MG capsule Take 1 capsule (300 mg total) by mouth daily. 01/14/20   Candee Furbish, MD  gabapentin (NEURONTIN) 100 MG capsule Take 1 capsule (100 mg total) by mouth 3 (three) times daily for 90 doses. 12/27/19 01/26/20  Adrian Prows, MD  lactulose (CHRONULAC) 10 GM/15ML solution Take 45 mLs (30 g total) by mouth daily as needed for mild constipation. 10/01/19   [name], MD  metoprolol tartrate (LOPRESSOR) 100 MG tablet Take 1 tablet (100  mg total) by mouth 2 (two) times daily. 06/04/19   Al Decant, MD  ondansetron (ZOFRAN) 4 MG tablet Take 4 mg by mouth every 4 (four) hours as needed for nausea or  vomiting.  09/18/19   [provider]  oxyCODONE 10 MG TABS Take 1 tablet (10 mg total) by mouth 3 (three) times daily as needed for up to 14 days for severe pain. 01/14/20 01/28/20  Candee Furbish, MD  pantoprazole (PROTONIX) 40 MG tablet Take 1 tablet (40 mg total) by mouth daily before breakfast. 12/27/19   Adrian Prows, MD  sodium polystyrene (KAYEXALATE) 15 GM/60ML suspension Take 120 mLs (30 g total) by mouth daily as needed (constipation). 01/14/20   Candee Furbish, MD    Allergies    Tramadol, Morphine and related, Pollen extract, Acetaminophen, Aspirin, and Clonidine derivatives  Review of Systems   Review of Systems  Constitutional: Negative for chills and fever.  HENT: Negative for ear pain and sore throat.   Eyes: Negative for pain and visual disturbance.  Respiratory: Positive for shortness of breath. Negative for cough.   Cardiovascular: Positive for chest pain. Negative for palpitations.  Gastrointestinal: Negative for abdominal pain, diarrhea, nausea and vomiting.  Genitourinary: Negative for dysuria and hematuria.  Musculoskeletal: Negative for arthralgias and back pain.  Skin: Negative for color change and rash.  Neurological: Negative for seizures and syncope.  All other systems reviewed and are negative.   Physical Exam Updated Vital Signs  ED Triage Vitals  Enc Vitals Group     BP 01/14/20 1808 129/64     Pulse Rate 01/14/20 1808 (!) 58     Resp 01/14/20 1808 (!) 25     Temp --      Temp src --      SpO2 01/14/20 1808 (!) 86 %     Weight 01/14/20 1753 135 lb (61.2 kg)     Height --      Head Circumference --      Peak Flow --      Pain Score 01/14/20 1752 10     Pain Loc --      Pain Edu? --      Excl. in [place]? --     Physical Exam Vitals and nursing note reviewed.  Constitutional:      Appearance: He is well-developed.  HENT:     Head: Normocephalic and atraumatic.  Eyes:     Conjunctiva/sclera: Conjunctivae normal.  Cardiovascular:     Rate  and Rhythm: Normal rate and regular rhythm.     Heart sounds: No murmur heard.   Pulmonary:     Effort: Pulmonary effort is normal. No respiratory distress.     Breath sounds: Normal breath sounds.  Abdominal:     Palpations: Abdomen is soft.     Tenderness: There is no abdominal tenderness.  Musculoskeletal:     Cervical back: Neck supple.  Skin:    General: Skin is warm and dry.  Neurological:     Mental Status: He is alert.     ED Results / Procedures / Treatments   Labs (all labs ordered are listed, but only abnormal results are displayed) Labs Reviewed  CBC WITH DIFFERENTIAL/PLATELET - Abnormal; Notable for the following components:      Result Value   WBC 11.0 (*)    RBC 3.82 (*)    Hemoglobin 10.5 (*)    HCT 31.0 (*)    Neutro Abs 9.0 (*)    Abs Immature  Granulocytes 0.08 (*)    All other components within normal limits  BASIC METABOLIC PANEL - Abnormal; Notable for the following components:   Sodium 133 (*)    Chloride 91 (*)    Creatinine, Ser 3.32 (*)    GFR calc non Af Amer 19 (*)    GFR calc Af Amer 23 (*)    All other components within normal limits  SARS CORONAVIRUS 2 BY RT PCR (HOSPITAL ORDER, [facility])  BRAIN NATRIURETIC PEPTIDE  BLOOD GAS, ARTERIAL  HEPATIC FUNCTION PANEL  TROPONIN I (HIGH SENSITIVITY)    EKG EKG Interpretation  Date/Time:  Monday January 14 2020 17:44:53 EDT Ventricular Rate:  79 PR Interval:    QRS Duration: 184 QT Interval:  505 QTC Calculation: 579 R Axis:   -61 Text Interpretation: Sinus tachycardia Atrial premature complexes in couplets Left bundle branch block Baseline wander in lead(s) V4 Confirmed by Lennice Sites [phone]) on 01/14/2020 5:52:46 PM   Radiology DG Chest Portable 1 View  Result Date: 01/14/2020 CLINICAL DATA:  Shortness of breath. EXAM: PORTABLE CHEST 1 VIEW COMPARISON:  January 13, 2020 FINDINGS: Mild, diffuse, chronic appearing increased lung markings are seen. There is decreased  vascular congestion since the prior study. Mild, hazy areas of atelectasis and/or early infiltrate are seen along the periphery of the mid left lung. There is no evidence of a pleural effusion or pneumothorax. The cardiac silhouette is markedly enlarged. There is mild calcification of the aortic arch. The visualized skeletal structures are unremarkable. IMPRESSION: 1. Mild, hazy areas of atelectasis and/or early infiltrate along the periphery of the mid left lung. 2. Decreased pulmonary vascular congestion when compared to the prior exam. Electronically Signed   By: Virgina Norfolk M.D.   On: 01/14/2020 18:29   DG Chest Portable 1 View  Result Date: 01/13/2020 CLINICAL DATA:  59 year old male with shortness of breath. EXAM: PORTABLE CHEST 1 VIEW COMPARISON:  Chest radiograph dated 12/21/2019 and CT dated 12/23/2019 FINDINGS: There is moderate enlargement of the cardiopericardial silhouette similar to the radiograph of 12/21/2019. There is central vascular prominence consistent with congestion. Trace bilateral pleural effusions noted. There are bibasilar atelectatic changes. No lobar consolidation or pneumothorax. Atherosclerotic calcification of the aorta and branches of the aortic arch. No acute osseous pathology. IMPRESSION: 1. Cardiomegaly with vascular congestion and possible mild edema. Overall slight interval worsening of the congestion since the radiograph of 12/21/2019. 2. Advanced atherosclerotic calcification of the aorta and branches of the aortic arch. Electronically Signed   By: Anner Crete M.D.   On: 01/13/2020 16:32    Procedures .Critical Care Performed by: Lennice Sites, DO Authorized by: Lennice Sites, DO   Critical care provider statement:    Critical care time (minutes):  40   Critical care was necessary to treat or prevent imminent or life-threatening deterioration of the following conditions:  Respiratory failure   Critical care was time spent personally by me on the  following activities:  Blood draw for specimens, development of treatment plan with patient or surrogate, discussions with primary provider, evaluation of patient's response to treatment, examination of patient, obtaining history from patient or surrogate, ordering and performing treatments and interventions, ordering and review of laboratory studies, ordering and review of radiographic studies, pulse oximetry, re-evaluation of patient's condition and review of old charts   I assumed direction of critical care for this patient from another provider in my specialty: no     (including critical care time)  Medications Ordered  in ED Medications  HYDROmorphone (DILAUDID) injection 0.5 mg (has no administration in time range)  diphenhydrAMINE (BENADRYL) injection 12.5 mg (12.5 mg Intravenous Given 01/14/20 1835)    ED Course  I have reviewed the triage vital signs and the nursing notes.  Pertinent labs & imaging results that were available during my care of the patient were reviewed by me and considered in my medical decision making (see chart for details).    MDM Rules/Calculators/A&P                          Raidyn Wassink is a 59 year old male with history of end-stage renal disease on hemodialysis, COPD, polysubstance abuse, cirrhosis who presents to the ED with shortness of breath.  Patient hypoxic on arrival to the low 80s.  Improved on 4 L of oxygen.  Was discharged earlier today after needed emergent dialysis for hyperkalemia.  According to his note he might have been on oxygen during his hospital stay.  However discharge summary is not completed.  He was discharged and had outpatient dialysis today where he continued to have shortness of breath and palpitations.  EKG shows sinus tachycardia with some PACs.  Overall some clear breath sounds on exam.  Trace edema on his legs.  Overall looks comfortable but has new hypoxia.  Does not wear oxygen at home.  Not sure if patient was supposed to  be discharged on oxygen.  He states that he is on antibiotics for an abdominal infection that he had several weeks ago.  No obvious abdominal distention, had an ultrasound yesterday that did not show much ascites.  Had paracentesis several days ago.  Suspect shortness of breath could be from infection versus PE versus overload.  Patient overall with possible new infiltrate on chest x-ray.  Mild leukocytosis.  However electrolytes appear unremarkable.  Patient with hypoxic respiratory failure.  At this time no clear cause for this.  Patient did not want to stay but after discussion will stay for further evaluation.  Discussed with hospitalist.  Will get CT scan to rule out PE and to further evaluate for infectious process.  Will get troponin, blood gas.  At this time will hold on antibiotics.  This could be volume related and may need additional dialysis/paracentesis.  Do not hear wheezing and low suspicion for COPD exacerbation.  Admitted to medicine in stable condition.  This chart was dictated using voice recognition software.  Despite best efforts to proofread,  errors can occur which can change the documentation meaning.   Final Clinical Impression(s) / ED Diagnoses Final diagnoses:  Acute respiratory failure with hypoxia [facility])    Rx / DC Orders ED Discharge Orders    None       Lennice Sites, DO 01/14/20 1932

## 2020-01-14 NOTE — Progress Notes (Addendum)
Report called from dialysis saying pt was done with treatment and that when he gets to 2M07 he "was leaving AMA". AMA paperwork at bedside. Dr. Gillermina Phy made aware. Awaiting pt arrival

## 2020-01-15 DIAGNOSIS — J441 Chronic obstructive pulmonary disease with (acute) exacerbation: Secondary | ICD-10-CM | POA: Diagnosis not present

## 2020-01-15 DIAGNOSIS — Z20822 Contact with and (suspected) exposure to covid-19: Secondary | ICD-10-CM | POA: Diagnosis not present

## 2020-01-15 DIAGNOSIS — F17210 Nicotine dependence, cigarettes, uncomplicated: Secondary | ICD-10-CM | POA: Diagnosis not present

## 2020-01-15 DIAGNOSIS — J9601 Acute respiratory failure with hypoxia: Secondary | ICD-10-CM | POA: Diagnosis not present

## 2020-01-15 LAB — CBC WITH DIFFERENTIAL/PLATELET
Abs Immature Granulocytes: 0.06 10*3/uL (ref 0.00–0.07)
Basophils Absolute: 0.1 10*3/uL (ref 0.0–0.1)
Basophils Relative: 1 %
Eosinophils Absolute: 0 10*3/uL (ref 0.0–0.5)
Eosinophils Relative: 0 %
HCT: 33.6 % — ABNORMAL LOW (ref 39.0–52.0)
Hemoglobin: 11.4 g/dL — ABNORMAL LOW (ref 13.0–17.0)
Immature Granulocytes: 1 %
Lymphocytes Relative: 4 %
Lymphs Abs: 0.4 10*3/uL — ABNORMAL LOW (ref 0.7–4.0)
MCH: 27.9 pg (ref 26.0–34.0)
MCHC: 33.9 g/dL (ref 30.0–36.0)
MCV: 82.4 fL (ref 80.0–100.0)
Monocytes Absolute: 0.2 10*3/uL (ref 0.1–1.0)
Monocytes Relative: 1 %
Neutro Abs: 11.2 10*3/uL — ABNORMAL HIGH (ref 1.7–7.7)
Neutrophils Relative %: 93 %
Platelets: 207 10*3/uL (ref 150–400)
RBC: 4.08 MIL/uL — ABNORMAL LOW (ref 4.22–5.81)
RDW: 15.6 % — ABNORMAL HIGH (ref 11.5–15.5)
WBC: 11.9 10*3/uL — ABNORMAL HIGH (ref 4.0–10.5)
nRBC: 0 % (ref 0.0–0.2)

## 2020-01-15 LAB — MAGNESIUM: Magnesium: 2.1 mg/dL (ref 1.7–2.4)

## 2020-01-15 LAB — COMPREHENSIVE METABOLIC PANEL
ALT: 33 U/L (ref 0–44)
AST: 39 U/L (ref 15–41)
Albumin: 2.5 g/dL — ABNORMAL LOW (ref 3.5–5.0)
Alkaline Phosphatase: 308 U/L — ABNORMAL HIGH (ref 38–126)
Anion gap: 14 (ref 5–15)
BUN: 25 mg/dL — ABNORMAL HIGH (ref 6–20)
CO2: 22 mmol/L (ref 22–32)
Calcium: 9.8 mg/dL (ref 8.9–10.3)
Chloride: 93 mmol/L — ABNORMAL LOW (ref 98–111)
Creatinine, Ser: 4.12 mg/dL — ABNORMAL HIGH (ref 0.61–1.24)
GFR calc Af Amer: 17 mL/min — ABNORMAL LOW (ref >60)
GFR calc non Af Amer: 15 mL/min — ABNORMAL LOW (ref >60)
Glucose, Bld: 145 mg/dL — ABNORMAL HIGH (ref 70–99)
Potassium: 5.8 mmol/L — ABNORMAL HIGH (ref 3.5–5.1)
Sodium: 129 mmol/L — ABNORMAL LOW (ref 135–145)
Total Bilirubin: 0.8 mg/dL (ref 0.3–1.2)
Total Protein: 6.9 g/dL (ref 6.5–8.1)

## 2020-01-15 LAB — TROPONIN I (HIGH SENSITIVITY): Troponin I (High Sensitivity): 32 ng/L — ABNORMAL HIGH (ref <18)

## 2020-01-15 MED ORDER — DIPHENHYDRAMINE HCL 12.5 MG/5ML PO ELIX
25.0000 mg | ORAL_SOLUTION | Freq: Once | ORAL | Status: AC
  Administered 2020-01-15: 25 mg via ORAL
  Filled 2020-01-15: qty 10

## 2020-01-15 MED ORDER — ONDANSETRON HCL 4 MG/2ML IJ SOLN
4.0000 mg | Freq: Once | INTRAMUSCULAR | Status: DC | PRN
  Filled 2020-01-15: qty 2

## 2020-01-15 MED ORDER — DIPHENHYDRAMINE HCL 25 MG PO CAPS
25.0000 mg | ORAL_CAPSULE | Freq: Once | ORAL | Status: DC
  Filled 2020-01-15: qty 1

## 2020-01-15 MED ORDER — SODIUM ZIRCONIUM CYCLOSILICATE 10 G PO PACK
10.0000 g | PACK | Freq: Once | ORAL | Status: AC
  Administered 2020-01-15: 10 g via ORAL
  Filled 2020-01-15: qty 1

## 2020-01-15 NOTE — Discharge Summary (Signed)
Physician Discharge Summary  Patient ID: [name] MRN: [id number] DOB/AGE: 1960-10-06 59 y.o.  Admit date: 01/13/2020 Discharge date: 01/15/2020  Admission Diagnoses:  Discharge Diagnoses:  Active Problems:   Hyperkalemia   Discharged Condition: stable  Hospital Course: Patient was admitted with severe bradycardia related to hyperkalemia. He underwent HD and his bradycardia resolved.  He was discharged to resume usual hemodialysis.  His blood pressure medications were adjusted as below, he can restart them as blood pressure and heart rates allow.  He also had significant abdominal pain, much of which was chronic.  He did have a recent paracentesis and history of SBP.  Offered a diagnostic tap which he refused so we decided to treat with 7 days of cefdinir.  Consults: nephrology  Discharge Exam: Blood pressure 125/61, pulse 75, temperature 98 F (36.7 C), temperature source Oral, resp. rate 18, weight 53.4 kg, SpO2 93 %. No acute distress Heart sounds regular Extremities warm +JVD No peripheral edema Has muscle wasting  Disposition: Discharge disposition: 01-Home or Self Care        Allergies as of 01/14/2020      Reactions   Tramadol Itching, Other (See Comments)   Morphine And Related Other (See Comments)   Stomach pain   Pollen Extract    Other reaction(s): Sneezing (finding)   Acetaminophen Nausea Only   Stomach ache Other reaction(s): Other (See Comments) Stomach ache   Aspirin Other (See Comments), Itching   STOMACH PAIN Other reaction(s): Unknown STOMACH PAIN   Clonidine Derivatives Itching      Medication List    STOP taking these medications   amiodarone 200 MG tablet Commonly known as: Pacerone   diltiazem 240 MG 24 hr capsule Commonly known as: CARDIZEM CD     TAKE these medications   cefdinir 300 MG capsule Commonly known as: OMNICEF Take 1 capsule (300 mg total) by mouth daily.   gabapentin 100 MG capsule Commonly known as:  Neurontin Take 1 capsule (100 mg total) by mouth 3 (three) times daily for 90 doses.   hydrALAZINE 100 MG tablet Commonly known as: APRESOLINE Take 100 mg by mouth 2 (two) times daily.   hydrOXYzine 25 MG tablet Commonly known as: ATARAX/VISTARIL Take 1 tablet (25 mg total) by mouth 2 (two) times daily as needed for itching.   lactulose 10 GM/15ML solution Commonly known as: CHRONULAC Take 45 mLs (30 g total) by mouth daily as needed for mild constipation.   metoprolol tartrate 100 MG tablet Commonly known as: LOPRESSOR Take 1 tablet (100 mg total) by mouth 2 (two) times daily.   ondansetron 4 MG tablet Commonly known as: ZOFRAN Take 4 mg by mouth every 4 (four) hours as needed for nausea or vomiting.   Oxycodone HCl 10 MG Tabs Take 1 tablet (10 mg total) by mouth 3 (three) times daily as needed for up to 14 days for severe pain.   pantoprazole 40 MG tablet Commonly known as: PROTONIX Take 1 tablet (40 mg total) by mouth daily before breakfast.   sevelamer carbonate 800 MG tablet Commonly known as: RENVELA Take 1,600-3,200 mg by mouth See admin instructions. Take 4 tablets (3200 mg) by mouth 3 times daily with meals and 2 tablets (1600 mg) with snacks   sodium polystyrene 15 GM/60ML suspension Commonly known as: KAYEXALATE Take 120 mLs (30 g total) by mouth daily as needed (constipation).       Follow-up Information    [name], FNP Follow up in 2 week(s).  Specialty: Family Medicine Contact information: [place]  [address] [phone]               Signed: Candee Furbish 01/15/2020, 12:09 PM

## 2020-01-15 NOTE — Progress Notes (Signed)
DISCHARGE NOTE HOME [name] to be discharged Home per MD order. Discussed prescriptions and follow up appointments with the patient. Prescriptions given to patient; medication list explained in detail. Patient verbalized understanding.  Skin clean, dry and intact without evidence of skin break down, no evidence of skin tears noted. IV catheter discontinued intact. Site without signs and symptoms of complications. Dressing and pressure applied. Pt denies pain at the site currently. No complaints noted.  Patient free of lines, drains, and wounds.   An After Visit Summary (AVS) was printed and given to the patient. Patient escorted via wheelchair, and discharged home via private auto.  Arlyss Repress, RN

## 2020-01-15 NOTE — Discharge Summary (Signed)
Physician Discharge Summary  [name] JIR:[id number] DOB: 10/17/1960 DOA: 01/14/2020  PCP: [name], FNP  Admit date: 01/14/2020 Discharge date: 01/15/2020  Admitted From: home Discharge disposition:home  Patient refusing all care-- refusing HD-- says is he was sick enough he would stay but he is not and has things to do   Recommendations for Outpatient Follow-Up:   1. Needs continued medication management/compliance 2. Needs routine HD   Discharge Diagnosis:   Principal Problem:   Acute respiratory failure with hypoxia (HCC) Active Problems:   Nicotine dependence, cigarettes, uncomplicated   COPD exacerbation (HCC)   ESRD on dialysis ([place])   Cirrhosis of liver with ascites (HCC)   Abdominal pain   AF (paroxysmal atrial fibrillation) (HCC)   Coronary artery disease involving native coronary artery of native heart without angina pectoris    History of Present Illness:   59 year old male with past medical history of end-stage renal disease (MWF schedule), coronary artery disease (last cath 2018 S/P DES to RCA pulmonary hypertension with cor pulmonale, cirrhosis (Multifactorial due to EtOH abuse, HepB and Hep C, requires frequent large volume taps), paroxysmal atrial fibrillation, hypertension, COPD, nicotine dependence who presents to [facility] emergency department with complaints of palpitations.  Patient is a poor historian and becomes extremely angry with inquiries from the medical providers.  History is unfortunately limited secondary to this.  Patient reports that he was recently discharged from the hospital the morning of 7/5.  Unfortunately there is no discharge summary as of yet concerning this hospitalization however patient was placed in the intensive care unit for emergent dialysis due to severe hyperkalemia and bradycardia.  Patient did receive hemodialysis on 7/4.  Patient was discharged this morning and underwent outpatient  dialysis afterwards.  Patient reports that he went home after dialysis and while at home he experienced several episodes of severe palpitations.  Patient states that his blood pressure cuff stated that his heart rate was in the 160s.  Patient complains of some associated shortness of breath but denies cough different than his baseline.  Patient denies fevers or sick contacts.  Patient presented to [facility] emergency department for evaluation.  Upon evaluation in the emergency department patient was found to be hypoxic with oxygen saturations in the 80s requiring supplemental oxygen with nasal cannula.  Chest x-ray did reveal possible developing infiltrate of the left lung base.  Telemetry and EKG revealed controlled atrial fibrillation on arrival.  CT angiogram of the chest and ABG were ordered.  The hospitalist group was then called to assess patient for admission the hospital.   Hospital Course by Problem:   Paroxysmal atrial fibrillation with rapid ventricular response   Patient reports bouts of severe palpitation heart rates in excess of 160 at home.  Patient is a known history of paroxysmal atrial fibrillation and therefore a bout of rapid ventricular response is suspected.  He is Not sure he took his meds at home  Encouraged compliance   Acute respiratory failure with hypoxia [facility])   Patient presenting to the emergency department with evidence of hypoxia requiring supplemental oxygen.  Hypoxia is of unknown etiology  It is possible that patient has developed some degree of mild pulmonary edema status post a bout of rapid atrial fibrillation at home considering patient's reports of heart rates in excess of 160 and severe palpitations prior to arrival.  Weaned to RA and now is refusing further treatment   ESRD -refusing HD here  Medical Consultants:      Discharge Exam:   Vitals:   01/15/20 0603 01/15/20 1005  BP: 127/75 132/66  Pulse: 71 60  Resp: 19  20  Temp: 98.2 F (36.8 C)   SpO2: 95% 95%   Vitals:   01/14/20 2251 01/15/20 0538 01/15/20 0603 01/15/20 1005  BP: (!) 152/75  127/75 132/66  Pulse: 77  71 60  Resp: 19  19 20   Temp: 98.6 F (37 C)  98.2 F (36.8 C)   TempSrc: Oral  Oral   SpO2: (!) 82%  95% 95%  Weight:      Height:  5\' 9"  (1.753 m)      General exam: Appears calm and comfortable.   The results of significant diagnostics from this hospitalization (including imaging, microbiology, ancillary and laboratory) are listed below for reference.     Procedures and Diagnostic Studies:   CT Angio Chest PE W and/or Wo Contrast  Result Date: 01/14/2020 CLINICAL DATA:  Shortness of breath. Soft dialysis prematurely secondary to itching. Recent hospital discharge. EXAM: CT ANGIOGRAPHY CHEST WITH CONTRAST TECHNIQUE: Multidetector CT imaging of the chest was performed using the standard protocol during bolus administration of intravenous contrast. Multiplanar CT image reconstructions and MIPs were obtained to evaluate the vascular anatomy. CONTRAST:  64mL OMNIPAQUE IOHEXOL 350 MG/ML SOLN COMPARISON:  CT chest 12/23/2019 FINDINGS: Cardiovascular: Satisfactory opacification the pulmonary arteries to the segmental level. No pulmonary artery filling defects are identified. Central pulmonary arteries are normal caliber. Suboptimal opacification of the aorta for luminal evaluation. Atherosclerotic plaque within the normal caliber aorta. Normal 3 vessel branching of the aortic arch. Extensive atheromatous plaque throughout the proximal great vessels as included. Mild cardiomegaly with predominantly biatrial enlargement. Dense calcifications noted upon the coronary arteries as well as upon the mitral annulus and aortic leaflets. Small pericardial effusion. Distension of the azygos. Reflux of contrast into the IVC and hepatic veins. No other major venous abnormalities on this arterial phase imaging. Mediastinum/Nodes: Edematous changes the  mediastinum. Normal thyroid gland and thoracic inlet. No acute abnormality of the trachea or esophagus. No worrisome mediastinal, hilar or axillary adenopathy. Lungs/Pleura: Small bilateral pleural effusions with adjacent areas of passive atelectasis. Septal and fissural thickening is present with vascular redistribution. Some dependent areas of ground-glass opacity favoring a combination of passive and dependent atelectasis. No worrisome consolidative opacity. No pneumothorax. Stable biapical pleuroparenchymal scarring. No worrisome nodules or masses. Upper Abdomen: Upper abdominal ascites. No other acute upper abdominal abnormality is visible. Musculoskeletal: Diffuse body wall edema. Osseous manifestations of renal osteodystrophy. No worrisome bone or chest wall lesions. Review of the MIP images confirms the above findings. IMPRESSION: 1. No evidence of acute pulmonary artery filling defects to suggest pulmonary embolism. 2. Cardiomegaly with predominantly biatrial enlargement. Small pericardial effusion. 3. Reflux of contrast into the IVC and hepatic veins, can be seen with right heart failure/fluid overload. 4. Small bilateral pleural effusions with adjacent areas of passive atelectasis. 5. Fissural and septal thickening with vascular redistribution likely to reflect some interstitial edema. 6. Diffuse body wall edema, upper abdominal ascites, and body wall edema, consistent with anasarca. 7. Osseous manifestations of renal osteodystrophy. 8. Aortic Atherosclerosis (ICD10-I70.0). Electronically Signed   By: Lovena Le M.D.   On: 01/14/2020 21:00   DG Chest Portable 1 View  Result Date: 01/14/2020 CLINICAL DATA:  Shortness of breath. EXAM: PORTABLE CHEST 1 VIEW COMPARISON:  January 13, 2020 FINDINGS: Mild, diffuse, chronic appearing increased lung markings are seen. There is decreased vascular congestion  since the prior study. Mild, hazy areas of atelectasis and/or early infiltrate are seen along the periphery  of the mid left lung. There is no evidence of a pleural effusion or pneumothorax. The cardiac silhouette is markedly enlarged. There is mild calcification of the aortic arch. The visualized skeletal structures are unremarkable. IMPRESSION: 1. Mild, hazy areas of atelectasis and/or early infiltrate along the periphery of the mid left lung. 2. Decreased pulmonary vascular congestion when compared to the prior exam. Electronically Signed   By: Virgina Norfolk M.D.   On: 01/14/2020 18:29   [place] ASCITES (ABDOMEN LIMITED)  Result Date: 01/14/2020 CLINICAL DATA:  Assess ascites. Multiple previous paracenteses due to cirrhosis. EXAM: LIMITED ABDOMEN ULTRASOUND FOR ASCITES TECHNIQUE: Limited ultrasound survey for ascites was performed in all four abdominal quadrants. COMPARISON:  12/22/2019 FINDINGS: Examination demonstrates mild-to-moderate amount of ascites left greater than right. Ascites over the right abdomen is improved compared to the images from the previous exam. IMPRESSION: Mild-to-moderate ascites left greater than right. Electronically Signed   By: Marin Olp M.D.   On: 01/14/2020 21:52     Labs:   Basic Metabolic Panel: Recent Labs  Lab 01/13/20 1557 01/13/20 1557 01/13/20 1559 01/13/20 1559 01/14/20 0023 01/14/20 0023 01/14/20 1800 01/15/20 0838  NA 124*  --  127*  --  132*  --  133* 129*  K 6.6*   < > 7.2*   < > 4.2   < > 4.1 5.8*  CL 93*  --  90*  --  95*  --  91* 93*  CO2  --   --  21*  --  24  --  27 22  GLUCOSE 122*  --  118*  --  124*  --  97 145*  BUN 38*  --  38*  --  15  --  19 25*  CREATININE 6.40*  --  5.77*  --  3.15*  --  3.32* 4.12*  CALCIUM  --   --  9.4  --  9.0  --  9.0 9.8  MG  --   --  2.2  --  2.0  --   --  2.1  PHOS  --   --   --   --  3.9  --   --   --    < > = values in this interval not displayed.   GFR Estimated Creatinine Clearance: 17.1 mL/min (A) (by C-G formula based on SCr of 4.12 mg/dL (H)). Liver Function Tests: Recent Labs  Lab  01/13/20 1559 01/14/20 1926 01/15/20 0838  AST 54* 46* 39  ALT 39 37 33  ALKPHOS 314* 337* 308*  BILITOT 1.1 0.7 0.8  PROT 6.8 7.0 6.9  ALBUMIN 2.6* 2.6* 2.5*   Recent Labs  Lab 01/13/20 1559  LIPASE 27   No results for input(s): AMMONIA in the last 168 hours. Coagulation profile Recent Labs  Lab 01/13/20 1559  INR 1.2    CBC: Recent Labs  Lab 01/13/20 1557 01/13/20 1559 01/14/20 0023 01/14/20 1800 01/15/20 0838  WBC  --  12.8* 12.9* 11.0* 11.9*  NEUTROABS  --  10.1*  --  9.0* 11.2*  HGB 12.9* 12.0* 11.0* 10.5* 11.4*  HCT 38.0* 35.5* 31.6* 31.0* 33.6*  MCV  --  81.6 79.4* 81.2 82.4  PLT  --  283 196 215 207   Cardiac Enzymes: No results for input(s): CKTOTAL, CKMB, CKMBINDEX, TROPONINI in the last 168 hours. BNP: Invalid input(s): POCBNP CBG: Recent Labs  Lab 01/13/20  1545 01/13/20 1851 01/14/20 0023 01/14/20 0730  GLUCAP 139* 64* 122* 120*   D-Dimer No results for input(s): DDIMER in the last 72 hours. Hgb A1c No results for input(s): HGBA1C in the last 72 hours. Lipid Profile No results for input(s): CHOL, HDL, LDLCALC, TRIG, CHOLHDL, LDLDIRECT in the last 72 hours. Thyroid function studies No results for input(s): TSH, T4TOTAL, T3FREE, THYROIDAB in the last 72 hours.  Invalid input(s): FREET3 Anemia work up No results for input(s): VITAMINB12, FOLATE, FERRITIN, TIBC, IRON, RETICCTPCT in the last 72 hours. Microbiology Recent Results (from the past 240 hour(s))  SARS Coronavirus 2 by RT PCR (hospital order, performed in [facility] hospital lab) Nasopharyngeal Nasopharyngeal Swab     Status: None   Collection Time: 01/13/20  4:08 PM   Specimen: Nasopharyngeal Swab  Result Value Ref Range Status   SARS Coronavirus 2 NEGATIVE NEGATIVE Final    Comment: (NOTE) SARS-CoV-2 target nucleic acids are NOT DETECTED.  The SARS-CoV-2 RNA is generally detectable in upper and lower respiratory specimens during the acute phase of infection. The  lowest concentration of SARS-CoV-2 viral copies this assay can detect is 250 copies / mL. A negative result does not preclude SARS-CoV-2 infection and should not be used as the sole basis for treatment or other patient management decisions.  A negative result may occur with improper specimen collection / handling, submission of specimen other than nasopharyngeal swab, presence of viral mutation(s) within the areas targeted by this assay, and inadequate number of viral copies (<250 copies / mL). A negative result must be combined with clinical observations, patient history, and epidemiological information.  Fact Sheet for Patients:   StrictlyIdeas.no  Fact Sheet for Healthcare Providers: BankingDealers.co.za  This test is not yet approved or  cleared by the [place] FDA and has been authorized for detection and/or diagnosis of SARS-CoV-2 by FDA under an Emergency Use Authorization (EUA).  This EUA will remain in effect (meaning this test can be used) for the duration of the COVID-19 declaration under Section 564(b)(1) of the Act, 21 U.S.C. section 360bbb-3(b)(1), unless the authorization is terminated or revoked sooner.  Performed at [facility], [place] [address] [place], [place] [address]   Culture, blood (routine x 2)     Status: None (Preliminary result)   Collection Time: 01/13/20  5:29 PM   Specimen: BLOOD  Result Value Ref Range Status   Specimen Description BLOOD BLOOD LEFT WRIST  Final   Special Requests   Final    BOTTLES DRAWN AEROBIC AND ANAEROBIC Blood Culture adequate volume   Culture   Final    NO GROWTH < 24 HOURS Performed at [facility], [place] [address], [place], [place] [address]    Report Status PENDING  Incomplete  Culture, blood (routine x 2)     Status: None (Preliminary result)   Collection Time: 01/14/20 12:23 AM   Specimen: BLOOD LEFT HAND  Result Value Ref Range Status   Specimen  Description BLOOD LEFT HAND  Final   Special Requests   Final    BOTTLES DRAWN AEROBIC ONLY Blood Culture adequate volume   Culture   Final    NO GROWTH < 24 HOURS Performed at [facility], [place] [address], [place], [place] [address]    Report Status PENDING  Incomplete  SARS Coronavirus 2 by RT PCR (hospital order, performed in [facility] hospital lab) Nasopharyngeal Nasopharyngeal Swab     Status: None   Collection Time: 01/14/20  9:37 PM   Specimen: Nasopharyngeal Swab  Result Value Ref Range Status   SARS Coronavirus 2 NEGATIVE NEGATIVE Final    Comment: (NOTE) SARS-CoV-2 target nucleic acids are NOT DETECTED.  The SARS-CoV-2 RNA is generally detectable in upper and lower respiratory specimens during the acute phase of infection. The lowest concentration of SARS-CoV-2 viral copies this assay can detect is 250 copies / mL. A negative result does not preclude SARS-CoV-2 infection and should not be used as the sole basis for treatment or other patient management decisions.  A negative result may occur with improper specimen collection / handling, submission of specimen other than nasopharyngeal swab, presence of viral mutation(s) within the areas targeted by this assay, and inadequate number of viral copies (<250 copies / mL). A negative result must be combined with clinical observations, patient history, and epidemiological information.  Fact Sheet for Patients:   StrictlyIdeas.no  Fact Sheet for Healthcare Providers: BankingDealers.co.za  This test is not yet approved or  cleared by the [place] FDA and has been authorized for detection and/or diagnosis of SARS-CoV-2 by FDA under an Emergency Use Authorization (EUA).  This EUA will remain in effect (meaning this test can be used) for the duration of the COVID-19 declaration under Section 564(b)(1) of the Act, 21 U.S.C. section 360bbb-3(b)(1), unless the authorization  is terminated or revoked sooner.  Performed at [facility], [place] [address], [place], [place] [address]      Discharge Instructions:   Discharge Instructions    Discharge instructions   Complete by: As directed    Renal diet Be sure to go to HD and stay for entire time Take medications as prescribed   Increase activity slowly   Complete by: As directed    No wound care   Complete by: As directed      Allergies as of 01/15/2020      Reactions   Tramadol Itching, Other (See Comments)   Morphine And Related Other (See Comments)   Stomach pain   Pollen Extract    Other reaction(s): Sneezing (finding)   Acetaminophen Nausea Only   Stomach ache Other reaction(s): Other (See Comments) Stomach ache   Aspirin Other (See Comments), Itching   STOMACH PAIN Other reaction(s): Unknown STOMACH PAIN   Clonidine Derivatives Itching      Medication List    TAKE these medications   cefdinir 300 MG capsule Commonly known as: OMNICEF Take 1 capsule (300 mg total) by mouth daily.   gabapentin 100 MG capsule Commonly known as: Neurontin Take 1 capsule (100 mg total) by mouth 3 (three) times daily for 90 doses.   hydrALAZINE 100 MG tablet Commonly known as: APRESOLINE Take 100 mg by mouth 2 (two) times daily.   hydrOXYzine 25 MG tablet Commonly known as: ATARAX/VISTARIL Take 1 tablet (25 mg total) by mouth 2 (two) times daily as needed for itching.   lactulose 10 GM/15ML solution Commonly known as: CHRONULAC Take 45 mLs (30 g total) by mouth daily as needed for mild constipation.   metoprolol tartrate 100 MG tablet Commonly known as: LOPRESSOR Take 1 tablet (100 mg total) by mouth 2 (two) times daily.   ondansetron 4 MG tablet Commonly known as: ZOFRAN Take 4 mg by mouth every 4 (four) hours as needed for nausea or vomiting.   Oxycodone HCl 10 MG Tabs Take 1 tablet (10 mg total) by mouth 3 (three) times daily as needed for up to 14 days for severe pain.  pantoprazole 40 MG tablet Commonly known as: PROTONIX Take 1 tablet (40 mg total) by mouth daily before breakfast.   sevelamer carbonate 800 MG tablet Commonly known as: RENVELA Take 1,600-3,200 mg by mouth See admin instructions. Take 4 tablets (3200 mg) by mouth 3 times daily with meals and 2 tablets (1600 mg) with snacks   sodium polystyrene 15 GM/60ML suspension Commonly known as: KAYEXALATE Take 120 mLs (30 g total) by mouth daily as needed (constipation).       Follow-up Information    [name], FNP Follow up in 1 week(s).   Specialty: Family Medicine Contact information: [place] [address] [phone]                Time coordinating discharge: 25 min  Signed:  Geradine Girt DO  [facility] 01/15/2020, 10:24 AM

## 2020-01-17 ENCOUNTER — Ambulatory Visit (HOSPITAL_COMMUNITY)
Admission: RE | Admit: 2020-01-17 | Discharge: 2020-01-17 | Disposition: A | Discharge status: Home / Self Care | Payer: Medicare Other | Source: Ambulatory Visit | Attending: Gastroenterology | Admitting: Gastroenterology

## 2020-01-17 ENCOUNTER — Other Ambulatory Visit: Payer: Self-pay

## 2020-01-17 DIAGNOSIS — R188 Other ascites: Secondary | ICD-10-CM

## 2020-01-17 DIAGNOSIS — K7460 Unspecified cirrhosis of liver: Secondary | ICD-10-CM | POA: Insufficient documentation

## 2020-01-17 DIAGNOSIS — R0602 Shortness of breath: Secondary | ICD-10-CM | POA: Diagnosis not present

## 2020-01-17 DIAGNOSIS — J441 Chronic obstructive pulmonary disease with (acute) exacerbation: Secondary | ICD-10-CM | POA: Diagnosis not present

## 2020-01-17 HISTORY — PX: IR PARACENTESIS: IMG2679

## 2020-01-17 MED ORDER — LIDOCAINE HCL 1 % IJ SOLN
INTRAMUSCULAR | Status: AC
  Filled 2020-01-17: qty 20

## 2020-01-17 MED ORDER — LIDOCAINE HCL (PF) 1 % IJ SOLN
INTRAMUSCULAR | Status: AC | PRN
  Administered 2020-01-17: 10 mL

## 2020-01-17 NOTE — Procedures (Signed)
Ultrasound-guided therapeutic paracentesis performed yielding 3.2 liters of hazy,amber fluid. No immediate complications. EBL none.

## 2020-01-18 ENCOUNTER — Telehealth: Payer: Self-pay | Admitting: Gastroenterology

## 2020-01-18 LAB — CULTURE, BLOOD (ROUTINE X 2)
Culture: NO GROWTH
Special Requests: ADEQUATE

## 2020-01-18 NOTE — Telephone Encounter (Signed)
The pt has been advised that he needs to call the doctor that prescribes gabapentin.  The pt has been advised of the information and verbalized understanding.

## 2020-01-18 NOTE — Telephone Encounter (Signed)
It looks like the pt was seen by [facility] GI on 6/23.  I have advised the pt to call that office to make appt for paracentesis. FYI Jess

## 2020-01-19 ENCOUNTER — Encounter (HOSPITAL_COMMUNITY): Payer: Self-pay

## 2020-01-19 ENCOUNTER — Inpatient Hospital Stay (HOSPITAL_COMMUNITY)
Admission: EM | Admit: 2020-01-19 | Discharge: 2020-01-20 | DRG: 190 | Discharge status: Against Medical Advice | Payer: Medicare Other | Attending: Internal Medicine | Admitting: Internal Medicine

## 2020-01-19 ENCOUNTER — Emergency Department (HOSPITAL_COMMUNITY): Payer: Medicare Other

## 2020-01-19 ENCOUNTER — Other Ambulatory Visit: Payer: Self-pay

## 2020-01-19 DIAGNOSIS — D72829 Elevated white blood cell count, unspecified: Secondary | ICD-10-CM | POA: Diagnosis present

## 2020-01-19 DIAGNOSIS — Z800 Family history of malignant neoplasm of digestive organs: Secondary | ICD-10-CM

## 2020-01-19 DIAGNOSIS — J441 Chronic obstructive pulmonary disease with (acute) exacerbation: Principal | ICD-10-CM | POA: Diagnosis present

## 2020-01-19 DIAGNOSIS — R9431 Abnormal electrocardiogram [ECG] [EKG]: Secondary | ICD-10-CM | POA: Diagnosis not present

## 2020-01-19 DIAGNOSIS — Z825 Family history of asthma and other chronic lower respiratory diseases: Secondary | ICD-10-CM

## 2020-01-19 DIAGNOSIS — B181 Chronic viral hepatitis B without delta-agent: Secondary | ICD-10-CM | POA: Diagnosis present

## 2020-01-19 DIAGNOSIS — K7460 Unspecified cirrhosis of liver: Secondary | ICD-10-CM | POA: Diagnosis present

## 2020-01-19 DIAGNOSIS — I480 Paroxysmal atrial fibrillation: Secondary | ICD-10-CM | POA: Diagnosis present

## 2020-01-19 DIAGNOSIS — R188 Other ascites: Secondary | ICD-10-CM | POA: Diagnosis present

## 2020-01-19 DIAGNOSIS — F17210 Nicotine dependence, cigarettes, uncomplicated: Secondary | ICD-10-CM | POA: Diagnosis present

## 2020-01-19 DIAGNOSIS — E875 Hyperkalemia: Secondary | ICD-10-CM | POA: Diagnosis present

## 2020-01-19 DIAGNOSIS — J9621 Acute and chronic respiratory failure with hypoxia: Secondary | ICD-10-CM | POA: Diagnosis present

## 2020-01-19 DIAGNOSIS — Z992 Dependence on renal dialysis: Secondary | ICD-10-CM

## 2020-01-19 DIAGNOSIS — I050 Rheumatic mitral stenosis: Secondary | ICD-10-CM | POA: Diagnosis present

## 2020-01-19 DIAGNOSIS — I272 Pulmonary hypertension, unspecified: Secondary | ICD-10-CM | POA: Diagnosis present

## 2020-01-19 DIAGNOSIS — I447 Left bundle-branch block, unspecified: Secondary | ICD-10-CM | POA: Diagnosis present

## 2020-01-19 DIAGNOSIS — I10 Essential (primary) hypertension: Secondary | ICD-10-CM

## 2020-01-19 DIAGNOSIS — Z955 Presence of coronary angioplasty implant and graft: Secondary | ICD-10-CM | POA: Diagnosis not present

## 2020-01-19 DIAGNOSIS — Z20822 Contact with and (suspected) exposure to covid-19: Secondary | ICD-10-CM | POA: Diagnosis present

## 2020-01-19 DIAGNOSIS — K219 Gastro-esophageal reflux disease without esophagitis: Secondary | ICD-10-CM | POA: Diagnosis present

## 2020-01-19 DIAGNOSIS — D631 Anemia in chronic kidney disease: Secondary | ICD-10-CM | POA: Diagnosis present

## 2020-01-19 DIAGNOSIS — R079 Chest pain, unspecified: Secondary | ICD-10-CM | POA: Diagnosis present

## 2020-01-19 DIAGNOSIS — N2581 Secondary hyperparathyroidism of renal origin: Secondary | ICD-10-CM | POA: Diagnosis present

## 2020-01-19 DIAGNOSIS — I120 Hypertensive chronic kidney disease with stage 5 chronic kidney disease or end stage renal disease: Secondary | ICD-10-CM | POA: Diagnosis present

## 2020-01-19 DIAGNOSIS — I2510 Atherosclerotic heart disease of native coronary artery without angina pectoris: Secondary | ICD-10-CM | POA: Diagnosis present

## 2020-01-19 DIAGNOSIS — Z885 Allergy status to narcotic agent status: Secondary | ICD-10-CM

## 2020-01-19 DIAGNOSIS — Z808 Family history of malignant neoplasm of other organs or systems: Secondary | ICD-10-CM

## 2020-01-19 DIAGNOSIS — Z886 Allergy status to analgesic agent status: Secondary | ICD-10-CM

## 2020-01-19 DIAGNOSIS — Z8249 Family history of ischemic heart disease and other diseases of the circulatory system: Secondary | ICD-10-CM

## 2020-01-19 DIAGNOSIS — R0602 Shortness of breath: Secondary | ICD-10-CM | POA: Diagnosis present

## 2020-01-19 DIAGNOSIS — Z888 Allergy status to other drugs, medicaments and biological substances status: Secondary | ICD-10-CM

## 2020-01-19 DIAGNOSIS — I252 Old myocardial infarction: Secondary | ICD-10-CM | POA: Diagnosis not present

## 2020-01-19 DIAGNOSIS — Z720 Tobacco use: Secondary | ICD-10-CM | POA: Diagnosis present

## 2020-01-19 DIAGNOSIS — I4891 Unspecified atrial fibrillation: Secondary | ICD-10-CM

## 2020-01-19 DIAGNOSIS — Z801 Family history of malignant neoplasm of trachea, bronchus and lung: Secondary | ICD-10-CM

## 2020-01-19 DIAGNOSIS — Z5329 Procedure and treatment not carried out because of patient's decision for other reasons: Secondary | ICD-10-CM | POA: Diagnosis present

## 2020-01-19 DIAGNOSIS — Z9119 Patient's noncompliance with other medical treatment and regimen: Secondary | ICD-10-CM

## 2020-01-19 DIAGNOSIS — N189 Chronic kidney disease, unspecified: Secondary | ICD-10-CM | POA: Diagnosis present

## 2020-01-19 DIAGNOSIS — Z5321 Procedure and treatment not carried out due to patient leaving prior to being seen by health care provider: Secondary | ICD-10-CM | POA: Diagnosis not present

## 2020-01-19 DIAGNOSIS — N186 End stage renal disease: Secondary | ICD-10-CM

## 2020-01-19 DIAGNOSIS — J9690 Respiratory failure, unspecified, unspecified whether with hypoxia or hypercapnia: Secondary | ICD-10-CM | POA: Diagnosis present

## 2020-01-19 LAB — BASIC METABOLIC PANEL
Anion gap: 16 — ABNORMAL HIGH (ref 5–15)
BUN: 38 mg/dL — ABNORMAL HIGH (ref 6–20)
CO2: 27 mmol/L (ref 22–32)
Calcium: 9.6 mg/dL (ref 8.9–10.3)
Chloride: 87 mmol/L — ABNORMAL LOW (ref 98–111)
Creatinine, Ser: 4.99 mg/dL — ABNORMAL HIGH (ref 0.61–1.24)
GFR calc Af Amer: 14 mL/min — ABNORMAL LOW (ref >60)
GFR calc non Af Amer: 12 mL/min — ABNORMAL LOW (ref >60)
Glucose, Bld: 182 mg/dL — ABNORMAL HIGH (ref 70–99)
Potassium: 7.3 mmol/L (ref 3.5–5.1)
Sodium: 130 mmol/L — ABNORMAL LOW (ref 135–145)

## 2020-01-19 LAB — COMPREHENSIVE METABOLIC PANEL
ALT: 81 U/L — ABNORMAL HIGH (ref 0–44)
AST: 108 U/L — ABNORMAL HIGH (ref 15–41)
Albumin: 2.5 g/dL — ABNORMAL LOW (ref 3.5–5.0)
Alkaline Phosphatase: 421 U/L — ABNORMAL HIGH (ref 38–126)
Anion gap: 13 (ref 5–15)
BUN: 31 mg/dL — ABNORMAL HIGH (ref 6–20)
CO2: 28 mmol/L (ref 22–32)
Calcium: 9.3 mg/dL (ref 8.9–10.3)
Chloride: 90 mmol/L — ABNORMAL LOW (ref 98–111)
Creatinine, Ser: 4.38 mg/dL — ABNORMAL HIGH (ref 0.61–1.24)
GFR calc Af Amer: 16 mL/min — ABNORMAL LOW (ref >60)
GFR calc non Af Amer: 14 mL/min — ABNORMAL LOW (ref >60)
Glucose, Bld: 97 mg/dL (ref 70–99)
Potassium: 6.5 mmol/L (ref 3.5–5.1)
Sodium: 131 mmol/L — ABNORMAL LOW (ref 135–145)
Total Bilirubin: 1 mg/dL (ref 0.3–1.2)
Total Protein: 6.8 g/dL (ref 6.5–8.1)

## 2020-01-19 LAB — CBC WITH DIFFERENTIAL/PLATELET
Abs Immature Granulocytes: 0.11 10*3/uL — ABNORMAL HIGH (ref 0.00–0.07)
Basophils Absolute: 0.1 10*3/uL (ref 0.0–0.1)
Basophils Relative: 1 %
Eosinophils Absolute: 0.5 10*3/uL (ref 0.0–0.5)
Eosinophils Relative: 4 %
HCT: 33.3 % — ABNORMAL LOW (ref 39.0–52.0)
Hemoglobin: 11.4 g/dL — ABNORMAL LOW (ref 13.0–17.0)
Immature Granulocytes: 1 %
Lymphocytes Relative: 5 %
Lymphs Abs: 0.7 10*3/uL (ref 0.7–4.0)
MCH: 27.4 pg (ref 26.0–34.0)
MCHC: 34.2 g/dL (ref 30.0–36.0)
MCV: 80 fL (ref 80.0–100.0)
Monocytes Absolute: 1.1 10*3/uL — ABNORMAL HIGH (ref 0.1–1.0)
Monocytes Relative: 9 %
Neutro Abs: 10.3 10*3/uL — ABNORMAL HIGH (ref 1.7–7.7)
Neutrophils Relative %: 80 %
Platelets: 178 10*3/uL (ref 150–400)
RBC: 4.16 MIL/uL — ABNORMAL LOW (ref 4.22–5.81)
RDW: 15.7 % — ABNORMAL HIGH (ref 11.5–15.5)
WBC: 12.7 10*3/uL — ABNORMAL HIGH (ref 4.0–10.5)
nRBC: 0 % (ref 0.0–0.2)

## 2020-01-19 LAB — CULTURE, BLOOD (ROUTINE X 2)
Culture: NO GROWTH
Special Requests: ADEQUATE

## 2020-01-19 LAB — SARS CORONAVIRUS 2 BY RT PCR (HOSPITAL ORDER, PERFORMED IN ~~LOC~~ HOSPITAL LAB): SARS Coronavirus 2: NEGATIVE

## 2020-01-19 MED ORDER — LORAZEPAM 2 MG/ML IJ SOLN
0.5000 mg | INTRAMUSCULAR | Status: DC | PRN
  Administered 2020-01-19 – 2020-01-20 (×2): 0.5 mg via INTRAVENOUS
  Filled 2020-01-19 (×2): qty 1

## 2020-01-19 MED ORDER — SEVELAMER CARBONATE 800 MG PO TABS: 1600.0000 mg | ORAL_TABLET | ORAL | Status: DC

## 2020-01-19 MED ORDER — ALUM & MAG HYDROXIDE-SIMETH 200-200-20 MG/5ML PO SUSP
30.0000 mL | Freq: Once | ORAL | Status: DC
  Filled 2020-01-19: qty 30

## 2020-01-19 MED ORDER — ALBUTEROL SULFATE HFA 108 (90 BASE) MCG/ACT IN AERS
8.0000 | INHALATION_SPRAY | Freq: Once | RESPIRATORY_TRACT | Status: AC
  Administered 2020-01-19: 8 via RESPIRATORY_TRACT
  Filled 2020-01-19: qty 6.7

## 2020-01-19 MED ORDER — ACETAMINOPHEN 650 MG RE SUPP: 650.0000 mg | Freq: Four times a day (QID) | RECTAL | Status: DC | PRN

## 2020-01-19 MED ORDER — SODIUM CHLORIDE 0.9% FLUSH
3.0000 mL | Freq: Two times a day (BID) | INTRAVENOUS | Status: DC
  Administered 2020-01-19 – 2020-01-20 (×3): 3 mL via INTRAVENOUS

## 2020-01-19 MED ORDER — OXYCODONE HCL 5 MG PO TABS
10.0000 mg | ORAL_TABLET | Freq: Three times a day (TID) | ORAL | Status: DC | PRN
  Administered 2020-01-19 – 2020-01-20 (×3): 10 mg via ORAL
  Filled 2020-01-19 (×4): qty 2

## 2020-01-19 MED ORDER — AEROCHAMBER PLUS FLO-VU MEDIUM MISC
1.0000 | Freq: Once | Status: AC
  Administered 2020-01-19: 1
  Filled 2020-01-19 (×3): qty 1

## 2020-01-19 MED ORDER — DILTIAZEM HCL-DEXTROSE 125-5 MG/125ML-% IV SOLN (PREMIX): 5.0000 mg/h | INTRAVENOUS | Status: DC

## 2020-01-19 MED ORDER — IPRATROPIUM-ALBUTEROL 0.5-2.5 (3) MG/3ML IN SOLN
3.0000 mL | Freq: Four times a day (QID) | RESPIRATORY_TRACT | Status: DC
  Administered 2020-01-19: 3 mL via RESPIRATORY_TRACT
  Filled 2020-01-19: qty 3

## 2020-01-19 MED ORDER — IPRATROPIUM BROMIDE HFA 17 MCG/ACT IN AERS
4.0000 | INHALATION_SPRAY | Freq: Once | RESPIRATORY_TRACT | Status: AC
  Administered 2020-01-19: 4 via RESPIRATORY_TRACT
  Filled 2020-01-19: qty 12.9

## 2020-01-19 MED ORDER — CHLORHEXIDINE GLUCONATE CLOTH 2 % EX PADS: 6.0000 | MEDICATED_PAD | Freq: Every day | CUTANEOUS | Status: DC

## 2020-01-19 MED ORDER — LORAZEPAM 2 MG/ML IJ SOLN
1.0000 mg | Freq: Once | INTRAMUSCULAR | Status: AC
  Administered 2020-01-19: 1 mg via INTRAVENOUS
  Filled 2020-01-19: qty 1

## 2020-01-19 MED ORDER — LACTULOSE 10 GM/15ML PO SOLN
30.0000 g | Freq: Every day | ORAL | Status: DC | PRN
  Filled 2020-01-19: qty 45

## 2020-01-19 MED ORDER — OXYCODONE HCL 5 MG PO TABS
10.0000 mg | ORAL_TABLET | Freq: Three times a day (TID) | ORAL | Status: DC | PRN
  Filled 2020-01-19: qty 2

## 2020-01-19 MED ORDER — DILTIAZEM HCL-DEXTROSE 125-5 MG/125ML-% IV SOLN (PREMIX)
5.0000 mg/h | INTRAVENOUS | Status: DC
  Filled 2020-01-19: qty 125

## 2020-01-19 MED ORDER — GUAIFENESIN ER 600 MG PO TB12
600.0000 mg | ORAL_TABLET | Freq: Two times a day (BID) | ORAL | Status: DC
  Administered 2020-01-20 (×2): 600 mg via ORAL
  Filled 2020-01-19 (×2): qty 1

## 2020-01-19 MED ORDER — LEVALBUTEROL HCL 0.63 MG/3ML IN NEBU: 0.6300 mg | INHALATION_SOLUTION | Freq: Four times a day (QID) | RESPIRATORY_TRACT | Status: DC | PRN

## 2020-01-19 MED ORDER — METOPROLOL TARTRATE 100 MG PO TABS
100.0000 mg | ORAL_TABLET | Freq: Two times a day (BID) | ORAL | Status: DC
  Administered 2020-01-19 – 2020-01-20 (×3): 100 mg via ORAL
  Filled 2020-01-19 (×2): qty 1
  Filled 2020-01-19: qty 4

## 2020-01-19 MED ORDER — SODIUM POLYSTYRENE SULFONATE 15 GM/60ML PO SUSP
45.0000 g | Freq: Once | ORAL | Status: AC
  Administered 2020-01-19: 45 g via ORAL
  Filled 2020-01-19: qty 180

## 2020-01-19 MED ORDER — HEPARIN SODIUM (PORCINE) 5000 UNIT/ML IJ SOLN: 5000.0000 [IU] | Freq: Three times a day (TID) | INTRAMUSCULAR | Status: DC

## 2020-01-19 MED ORDER — METOPROLOL TARTRATE 5 MG/5ML IV SOLN
2.5000 mg | Freq: Once | INTRAVENOUS | Status: AC
  Administered 2020-01-19: 2.5 mg via INTRAVENOUS
  Filled 2020-01-19: qty 5

## 2020-01-19 MED ORDER — METHYLPREDNISOLONE SODIUM SUCC 125 MG IJ SOLR
125.0000 mg | Freq: Once | INTRAMUSCULAR | Status: AC
  Administered 2020-01-19: 125 mg via INTRAVENOUS
  Filled 2020-01-19: qty 2

## 2020-01-19 MED ORDER — METHYLPREDNISOLONE SODIUM SUCC 125 MG IJ SOLR
60.0000 mg | Freq: Three times a day (TID) | INTRAMUSCULAR | Status: DC
  Administered 2020-01-19 – 2020-01-20 (×3): 60 mg via INTRAVENOUS
  Filled 2020-01-19 (×3): qty 2

## 2020-01-19 MED ORDER — METOPROLOL TARTRATE 5 MG/5ML IV SOLN: 2.5000 mg | INTRAVENOUS | Status: DC | PRN

## 2020-01-19 MED ORDER — SODIUM ZIRCONIUM CYCLOSILICATE 10 G PO PACK: 10.0000 g | PACK | Freq: Every day | ORAL | Status: DC

## 2020-01-19 MED ORDER — MAGNESIUM SULFATE IN D5W 1-5 GM/100ML-% IV SOLN
1.0000 g | Freq: Once | INTRAVENOUS | Status: AC
  Administered 2020-01-19: 1 g via INTRAVENOUS
  Filled 2020-01-19: qty 100

## 2020-01-19 MED ORDER — ALBUTEROL SULFATE HFA 108 (90 BASE) MCG/ACT IN AERS: 4.0000 | INHALATION_SPRAY | Freq: Once | RESPIRATORY_TRACT | Status: DC

## 2020-01-19 MED ORDER — HYDRALAZINE HCL 50 MG PO TABS
100.0000 mg | ORAL_TABLET | Freq: Two times a day (BID) | ORAL | Status: DC
  Administered 2020-01-20 (×2): 100 mg via ORAL
  Filled 2020-01-19 (×2): qty 2

## 2020-01-19 MED ORDER — GABAPENTIN 100 MG PO CAPS
100.0000 mg | ORAL_CAPSULE | Freq: Three times a day (TID) | ORAL | Status: DC
  Administered 2020-01-19 – 2020-01-20 (×3): 100 mg via ORAL
  Filled 2020-01-19 (×3): qty 1

## 2020-01-19 MED ORDER — DILTIAZEM LOAD VIA INFUSION
15.0000 mg | Freq: Once | INTRAVENOUS | Status: DC
  Filled 2020-01-19: qty 15

## 2020-01-19 MED ORDER — SEVELAMER CARBONATE 800 MG PO TABS: 3200.0000 mg | ORAL_TABLET | Freq: Three times a day (TID) | ORAL | Status: DC

## 2020-01-19 MED ORDER — METOPROLOL TARTRATE 5 MG/5ML IV SOLN: 5.0000 mg | Freq: Once | INTRAVENOUS | Status: DC

## 2020-01-19 MED ORDER — ALUM & MAG HYDROXIDE-SIMETH 200-200-20 MG/5ML PO SUSP: 30.0000 mL | Freq: Four times a day (QID) | ORAL | Status: DC | PRN

## 2020-01-19 MED ORDER — HYDROMORPHONE HCL 2 MG PO TABS
1.0000 mg | ORAL_TABLET | Freq: Once | ORAL | Status: AC
  Administered 2020-01-19: 1 mg via ORAL
  Filled 2020-01-19: qty 1

## 2020-01-19 MED ORDER — ACETAMINOPHEN 325 MG PO TABS: 650.0000 mg | ORAL_TABLET | Freq: Four times a day (QID) | ORAL | Status: DC | PRN

## 2020-01-19 MED ORDER — SEVELAMER CARBONATE 800 MG PO TABS: 1600.0000 mg | ORAL_TABLET | Freq: Three times a day (TID) | ORAL | Status: DC | PRN

## 2020-01-19 MED ORDER — CEFDINIR 300 MG PO CAPS
300.0000 mg | ORAL_CAPSULE | Freq: Every day | ORAL | Status: DC
  Administered 2020-01-20: 300 mg via ORAL
  Filled 2020-01-19: qty 1

## 2020-01-19 NOTE — Progress Notes (Signed)
[facility] Progress Note   Subjective:   Patient seen and examined at bedside.  Reports worsened dyspnea and productive cough this AM prompting him to come to the ED.  Completed 2.5 of 4 hours of dialysis yesterday leaving 5.5L over his dry weight.  Has been to every dialysis for the last 3 weeks but only stayed 1.5-3.5 hours each treatment. Denies CP, n/v, abdominal pain, weakness dizziness and fatigue.  Admits to loose stools. Pertinent findings include K 6.5, tachycardia with HR 130s, and CXR with no acute findings.    Recent admission from 7/4-7/6 for bradycardia related to hyperkalemia, which resolved after dialysis.      Objective Vitals:   01/19/20 0930 01/19/20 0948 01/19/20 1235 01/19/20 1236  BP: (!) 129/91 (!) 129/91 (!) 173/75   Pulse: 90 (!) 144    Resp: 18 (!) 22 18   Temp:      TempSrc:      SpO2: 99% 99%  97%  Weight:       Physical Exam General:chronically ill appearing male in NAD Heart:irregular rate, regular rhythm Lungs:+diffuse rhonchi w/crackles in bases Abdomen:soft, +distended, NT Extremities:no LE edema Dialysis Access: RU AVF +b   Filed Weights   01/19/20 0714  Weight: 61.2 kg    Intake/Output Summary (Last 24 hours) at 01/19/2020 1603 Last data filed at 01/19/2020 1144 Gross per 24 hour  Intake 100 ml  Output --  Net 100 ml    Additional Objective Labs: Basic Metabolic Panel: Recent Labs  Lab 01/14/20 0023 01/14/20 0023 01/14/20 1800 01/15/20 0838 01/19/20 0838  NA 132*   < > 133* 129* 131*  K 4.2   < > 4.1 5.8* 6.5*  CL 95*   < > 91* 93* 90*  CO2 24   < > 27 22 28   GLUCOSE 124*   < > 97 145* 97  BUN 15   < > 19 25* 31*  CREATININE 3.15*   < > 3.32* 4.12* 4.38*  CALCIUM 9.0   < > 9.0 9.8 9.3  PHOS 3.9  --   --   --   --    < > = values in this interval not displayed.   Liver Function Tests: Recent Labs  Lab 01/14/20 1926 01/15/20 0838 01/19/20 0838  AST 46* 39 108*  ALT 37 33 81*  ALKPHOS 337* 308* 421*   BILITOT 0.7 0.8 1.0  PROT 7.0 6.9 6.8  ALBUMIN 2.6* 2.5* 2.5*   Recent Labs  Lab 01/13/20 1559  LIPASE 27   CBC: Recent Labs  Lab 01/13/20 1559 01/13/20 1559 01/14/20 0023 01/14/20 0023 01/14/20 1800 01/15/20 0838 01/19/20 0838  WBC 12.8*   < > 12.9*   < > 11.0* 11.9* 12.7*  NEUTROABS 10.1*   < >  --   --  9.0* 11.2* 10.3*  HGB 12.0*   < > 11.0*   < > 10.5* 11.4* 11.4*  HCT 35.5*   < > 31.6*   < > 31.0* 33.6* 33.3*  MCV 81.6  --  79.4*  --  81.2 82.4 80.0  PLT 283   < > 196   < > 215 207 178   < > = values in this interval not displayed.   Blood Culture    Component Value Date/Time   SDES BLOOD LEFT HAND 01/14/2020 0023   SPECREQUEST  01/14/2020 0023    BOTTLES DRAWN AEROBIC ONLY Blood Culture adequate volume   CULT  01/14/2020 0023    NO  GROWTH 5 DAYS Performed at [facility], [place] [address] [place], [place] [address]    REPTSTATUS 01/19/2020 FINAL 01/14/2020 0023    CBG: Recent Labs  Lab 01/13/20 1545 01/13/20 1851 01/14/20 0023 01/14/20 0730  GLUCAP 139* 64* 122* 120*   Studies/Results: DG Chest 2 View  Result Date: 01/19/2020 CLINICAL DATA:  Shortness of breath, productive cough. Dialysis yesterday. EXAM: CHEST - 2 VIEW COMPARISON:  Chest x-rays dated 01/14/2020 and 01/13/2020. Chest CT angiogram dated 01/14/2020. FINDINGS: Stable cardiomegaly. Lungs are clear. No pleural effusion or pneumothorax is seen. IMPRESSION: 1. Lungs are clear. No evidence of pneumonia or pulmonary edema on today's exam. 2. Stable cardiomegaly. Electronically Signed   By: Franki Cabot M.D.   On: 01/19/2020 08:22    Medications:  . alum & mag hydroxide-simeth  30 mL Oral Once  . [START ON 01/20/2020] cefdinir  300 mg Oral Daily  . [START ON 01/20/2020] Chlorhexidine Gluconate Cloth  6 each Topical Q0600  . gabapentin  100 mg Oral TID  . guaiFENesin  600 mg Oral BID  . heparin  5,000 Units Subcutaneous Q8H  . hydrALAZINE  100 mg Oral BID  . ipratropium-albuterol  3  mL Nebulization QID  . methylPREDNISolone (SOLU-MEDROL) injection  60 mg Intravenous Q8H  . metoprolol tartrate  100 mg Oral BID  . sevelamer carbonate  3,200 mg Oral TID WC  . sodium chloride flush  3 mL Intravenous Q12H    Dialysis Orders: [facility], 4h edw 61kg2/2 bath 450/800 R arm AVG Hep none mircera 73mcg q2wks - last 01/16/20 Calcitriol 57mcg qHD Venofer 50mg  qwk  Assessment/Plan: 1. Dyspnea - CXR clear, +rhonchi/crackles on exam.  Left 5.5kg over EDW yesterday.  Suspect some component of volume overload.  Plan for extra HD today with net UF 2.5L.  2. Hyperkalemia - K 6.5, given kayexelate in ED.  Plan for HD today with low K bath.  2. ESRD - on HD MWF.  Chronically non compliant. Extra HD today.   3. Anemia of CKD- Hgb 11.4. Recently ESA dose.  4. Secondary hyperparathyroidism - CCa 10.5, hold calcitriol.  Will check phos.   5. HTN/volume - BP elevated.  Expect improvement post HD.  Continue home meds.  >5L over dry.  Extra HD today. 6. Nutrition - Renal diet w/fluid restrictions 7. Hep B + 8. Prolonged QT 9. GERD 10. Cirrhosis w/recurrent ascites - Plans for paracentesis in AM   [name], PA-C [facility] Pager: [phone] 01/19/2020,4:03 PM  LOS: 0 days

## 2020-01-19 NOTE — ED Provider Notes (Signed)
[facility] EMERGENCY DEPARTMENT Provider Note   CSN: [id number] Arrival date & time: 01/19/20  7829     History Chief Complaint  Patient presents with  . Shortness of Breath    [name] is a 59 y.o. male with history of ESRD on MWF HD (last full session yesterday), CAD status post stents, pulmonary hypertension, cirrhosis requiring frequent large-volume taps, PAF, hypertension, COPD, ongoing tobacco use presents to the ED for evaluation of cough, shortness of breath and "chest burning".  This began yesterday.  States he was just sitting down when it started.  His chest discomfort is intermittent, it burns when he is coughing.  Has associated white phlegm production that is worse than his usual.  He denies any fever.  He continues to smoke cigarettes.  Does not usually use oxygen at home.  He had a full dialysis session yesterday.  States he had a paracentesis here at [facility] on Thursday.  Denies any palpitations, syncope.  Denies any worsening swelling in his legs or in his abdomen.  Patient is a difficult historian, gives me very short, brief answers and keeps his eyes closed and face covered with his arm during evaluation.  He is on 3 L [place] currently with 99% SPO2.  Denies sick contacts. Denies calf pain or swelling. Denies hemoptysis. Had first vaccine for COVID yesterday. HPI     Past Medical History:  Diagnosis Date  . Anemia   . Anxiety   . Arthritis    left shoulder  . Atherosclerosis of aorta ([place])   . Cardiomegaly   . Chest pain    DATE UNKNOWN, C/O PERIODICALLY  . Cocaine abuse ([place])   . COPD exacerbation ([place]) 08/17/2016  . Coronary artery disease    stent 02/22/17  . ESRD (end stage renal disease) on dialysis ([place])    "[name]; MWF" (07/04/2017)  . GERD (gastroesophageal reflux disease)    DATE UNKNOWN  . Hemorrhoids   . Hepatitis B, chronic ([place])   . Hepatitis C   . History of kidney stones   . Hyperkalemia   . Hypertension   .  Kidney failure   . Metabolic bone disease    Patient denies  . Mitral stenosis   . Myocardial infarction ([place])   . Pneumonia   . Pulmonary edema   . Solitary rectal ulcer syndrome 07/2017   at flex sig for rectal bleeding  . Tubular adenoma of colon     Patient Active Problem List   Diagnosis Date Noted  . Acute pulmonary edema ([place]) 12/21/2019  . Acetaminophen overdose 10/27/2019  . ESRD (end stage renal disease) ([place])   . Goals of care, counseling/discussion   . Hypoxia 10/04/2019  . Advanced care planning/counseling discussion   . Renal failure 09/26/2019  . Dyspnea 06/09/2019  . Acquired thrombophilia ([place]) 06/05/2019  . A-fib ([place]) 05/30/2019  . Atrial fibrillation with RVR ([place]) 05/29/2019  . Melena   . Pressure injury of skin 03/09/2019  . Abdominal distention   . Volume overload 12/28/2018  . Sepsis ([place]) 09/12/2018  . Coronary artery disease involving native coronary artery of native heart without angina pectoris 03/11/2018  . Benign neoplasm of cecum   . Benign neoplasm of ascending colon   . Benign neoplasm of descending colon   . Benign neoplasm of rectum   . AF (paroxysmal atrial fibrillation) ([place]) 01/23/2018  . Hx of colonic polyps 01/20/2018  . End-stage renal disease on hemodialysis ([place]) 11/21/2017  . GERD (  gastroesophageal reflux disease) 11/16/2017  . Decompensated hepatic cirrhosis ([place]) 11/15/2017  . Palliative care by specialist   . Hyponatremia 11/04/2017  . SBP (spontaneous bacterial peritonitis) ([place]) 10/30/2017  . Liver disease, chronic 10/30/2017  . SOB (shortness of breath)   . Abdominal pain 10/28/2017  . Upper airway cough syndrome with flattening on f/v loop 10/13/17 c/w vcd 10/17/2017  . Elevated diaphragm 10/13/2017  . Ileus ([place]) 09/29/2017  . QT prolongation 09/29/2017  . Malnutrition of moderate degree 09/29/2017  . Sinus congestion 09/03/2017  . Symptomatic anemia 09/02/2017  . Cirrhosis of liver with ascites ([place]) 09/02/2017    . Left bundle branch block 09/02/2017  . Mitral stenosis 09/02/2017  . Hematochezia 07/15/2017  . Wide-complex tachycardia ([place])   . Endotracheally intubated   . ESRD on dialysis ([place]) 07/04/2017  . Acute respiratory failure with hypoxia ([place]) 06/18/2017  . CKD (chronic kidney disease) stage V requiring chronic dialysis ([place]) 06/18/2017  . History of Cocaine abuse ([place]) 06/18/2017  . Hypertension 06/18/2017  . Infection of AV graft for dialysis ([place]) 06/18/2017  . Anxiety 06/18/2017  . Anemia due to chronic kidney disease 06/18/2017  . Atypical atrial flutter ([place]) 06/18/2017  . Personality disorder ([place]) 06/13/2017  . Cellulitis 06/12/2017  . Adjustment disorder with mixed anxiety and depressed mood 06/10/2017  . Suicidal ideation 06/10/2017  . Arm wound, left, sequela 06/10/2017  . Dyspnea on exertion 05/29/2017  . Tachycardia 05/29/2017  . Hyperkalemia 05/22/2017  . Acute metabolic encephalopathy   . Anemia 04/23/2017  . Ascites 04/23/2017  . COPD (chronic obstructive pulmonary disease) ([place]) 04/23/2017  . Acute on chronic respiratory failure with hypoxia ([place]) 03/25/2017  . Arrhythmia 03/25/2017  . COPD GOLD 0 with flattening on inps f/v  09/27/2016  . Essential hypertension 09/27/2016  . Fluid overload 08/30/2016  . COPD exacerbation ([place]) 08/17/2016  . Hypertensive urgency 08/17/2016  . Respiratory failure ([place]) 08/17/2016  . Problem with dialysis access ([place]) 07/23/2016  . Chronic hepatitis B ([place]) 03/05/2014  . Chronic hepatitis C without hepatic coma ([place]) 03/05/2014  . Internal hemorrhoids with bleeding, swelling and itching 03/05/2014  . Thrombocytopenia ([place]) 03/05/2014  . Chest pain 02/27/2014  . Alcohol abuse 04/14/2009  . Nicotine dependence, cigarettes, uncomplicated 55/73/2202  . GANGLION CYST 04/14/2009    Past Surgical History:  Procedure Laterality Date  . A/V FISTULAGRAM Left 05/26/2017   Procedure: A/V FISTULAGRAM;  Surgeon: Conrad Hokes Bluff, MD;   Location: [place] [facility];  Service: Cardiovascular;  Laterality: Left;  . A/V FISTULAGRAM Right 11/18/2017   Procedure: A/V FISTULAGRAM - Right Arm;  Surgeon: Elam Dutch, MD;  Location: [place] [facility];  Service: Cardiovascular;  Laterality: Right;  . APPLICATION OF WOUND VAC Left 06/14/2017   Procedure: APPLICATION OF WOUND VAC;  Surgeon: Katha Cabal, MD;  Location: [facility] ORS;  Service: Vascular;  Laterality: Left;  . AV FISTULA PLACEMENT  2012   BELIEVED WAS PLACED IN JUNE  . AV FISTULA PLACEMENT Right 08/09/2017   Procedure: Creation Right arm ARTERIOVENOUS BRACHIOCEPOHALIC FISTULA;  Surgeon: Elam Dutch, MD;  Location: [facility];  Service: Vascular;  Laterality: Right;  . AV FISTULA PLACEMENT Right 11/22/2017   Procedure: INSERTION OF ARTERIOVENOUS (AV) GORE-TEX GRAFT RIGHT UPPER ARM;  Surgeon: Elam Dutch, MD;  Location: [place];  Service: Vascular;  Laterality: Right;  . BIOPSY  01/25/2018   Procedure: BIOPSY;  Surgeon: Jerene Bears, MD;  Location: [place];  Service: Gastroenterology;;  . BIOPSY  04/10/2019   Procedure: BIOPSY;  Surgeon: Jerene Bears, MD;  Location: [facility];  Service: Gastroenterology;;  . COLONOSCOPY    . COLONOSCOPY WITH PROPOFOL N/A 01/25/2018   Procedure: COLONOSCOPY WITH PROPOFOL;  Surgeon: Jerene Bears, MD;  Location: [place];  Service: Gastroenterology;  Laterality: N/A;  . CORONARY STENT INTERVENTION N/A 02/22/2017   Procedure: CORONARY STENT INTERVENTION;  Surgeon: Nigel Mormon, MD;  Location: [place] [facility];  Service: Cardiovascular;  Laterality: N/A;  . ESOPHAGOGASTRODUODENOSCOPY (EGD) WITH PROPOFOL N/A 01/25/2018   Procedure: ESOPHAGOGASTRODUODENOSCOPY (EGD) WITH PROPOFOL;  Surgeon: Jerene Bears, MD;  Location: [place];  Service: Gastroenterology;  Laterality: N/A;  . ESOPHAGOGASTRODUODENOSCOPY (EGD) WITH PROPOFOL N/A 04/10/2019   Procedure: ESOPHAGOGASTRODUODENOSCOPY (EGD) WITH PROPOFOL;  Surgeon: Jerene Bears, MD;  Location: [facility];  Service: Gastroenterology;  Laterality: N/A;  . FLEXIBLE SIGMOIDOSCOPY N/A 07/15/2017   Procedure: FLEXIBLE SIGMOIDOSCOPY;  Surgeon: Carol Ada, MD;  Location: [place];  Service: Endoscopy;  Laterality: N/A;  . HEMORRHOID BANDING    . I & D EXTREMITY Left 06/01/2017   Procedure: IRRIGATION AND DEBRIDEMENT LEFT ARM HEMATOMA WITH LIGATION OF LEFT ARM AV FISTULA;  Surgeon: Elam Dutch, MD;  Location: [place];  Service: Vascular;  Laterality: Left;  . I & D EXTREMITY Left 06/14/2017   Procedure: IRRIGATION AND DEBRIDEMENT EXTREMITY;  Surgeon: Katha Cabal, MD;  Location: [facility];  Service: Vascular;  Laterality: Left;  . INSERTION OF DIALYSIS CATHETER  05/30/2017  . INSERTION OF DIALYSIS CATHETER N/A 05/30/2017   Procedure: INSERTION OF DIALYSIS CATHETER;  Surgeon: Elam Dutch, MD;  Location: [place];  Service: Vascular;  Laterality: N/A;  . IR PARACENTESIS  08/30/2017  . IR PARACENTESIS  09/29/2017  . IR PARACENTESIS  10/28/2017  . IR PARACENTESIS  11/09/2017  . IR PARACENTESIS  11/16/2017  . IR PARACENTESIS  11/28/2017  . IR PARACENTESIS  12/01/2017  . IR PARACENTESIS  12/06/2017  . IR PARACENTESIS  01/03/2018  . IR PARACENTESIS  01/23/2018  . IR PARACENTESIS  02/07/2018  . IR PARACENTESIS  02/21/2018  . IR PARACENTESIS  03/06/2018  . IR PARACENTESIS  03/17/2018  . IR PARACENTESIS  04/04/2018  . IR PARACENTESIS  12/28/2018  . IR PARACENTESIS  01/08/2019  . IR PARACENTESIS  01/23/2019  . IR PARACENTESIS  02/01/2019  . IR PARACENTESIS  02/19/2019  . IR PARACENTESIS  03/01/2019  . IR PARACENTESIS  03/15/2019  . IR PARACENTESIS  04/03/2019  . IR PARACENTESIS  04/12/2019  . IR PARACENTESIS  05/01/2019  . IR PARACENTESIS  05/08/2019  . IR PARACENTESIS  05/24/2019  . IR PARACENTESIS  06/12/2019  . IR PARACENTESIS  07/09/2019  . IR PARACENTESIS  07/27/2019  . IR PARACENTESIS  08/09/2019  . IR PARACENTESIS  08/21/2019  . IR PARACENTESIS  09/17/2019  . IR  PARACENTESIS  10/05/2019  . IR PARACENTESIS  10/29/2019  . IR PARACENTESIS  11/08/2019  . IR PARACENTESIS  12/12/2019  . IR PARACENTESIS  01/03/2020  . IR PARACENTESIS  01/10/2020  . IR PARACENTESIS  01/17/2020  . IR RADIOLOGIST EVAL & MGMT  02/14/2018  . IR RADIOLOGIST EVAL & MGMT  02/22/2019  . LEFT HEART CATH AND CORONARY ANGIOGRAPHY N/A 02/22/2017   Procedure: LEFT HEART CATH AND CORONARY ANGIOGRAPHY;  Surgeon: Nigel Mormon, MD;  Location: [place] [facility];  Service: Cardiovascular;  Laterality: N/A;  . LIGATION OF ARTERIOVENOUS  FISTULA Left 4/0/9811   Procedure: Plication of Left Arm  Arteriovenous Fistula;  Surgeon: Elam Dutch, MD;  Location: [place];  Service: Vascular;  Laterality: Left;  . POLYPECTOMY    . POLYPECTOMY  01/25/2018   Procedure: POLYPECTOMY;  Surgeon: Jerene Bears, MD;  Location: [place];  Service: Gastroenterology;;  . REVISON OF ARTERIOVENOUS FISTULA Left 0/37/0488   Procedure: PLICATION OF DISTAL ANEURYSMAL SEGEMENT OF LEFT UPPER ARM ARTERIOVENOUS FISTULA;  Surgeon: Elam Dutch, MD;  Location: [place];  Service: Vascular;  Laterality: Left;  . REVISON OF ARTERIOVENOUS FISTULA Left 8/91/6945   Procedure: Plication of Left Upper Arm Fistula ;  Surgeon: Waynetta Sandy, MD;  Location: [place];  Service: Vascular;  Laterality: Left;  . SKIN GRAFT SPLIT THICKNESS LEG / FOOT Left    SKIN GRAFT SPLIT THICKNESS LEFT ARM DONOR SITE: LEFT ANTERIOR THIGH  . SKIN SPLIT GRAFT Left 07/04/2017   Procedure: SKIN GRAFT SPLIT THICKNESS LEFT ARM DONOR SITE: LEFT ANTERIOR THIGH;  Surgeon: Elam Dutch, MD;  Location: [place];  Service: Vascular;  Laterality: Left;  . THROMBECTOMY W/ EMBOLECTOMY Left 06/05/2017   Procedure: EXPLORATION OF LEFT ARM FOR BLEEDING; OVERSEWED PROXIMAL FISTULA;  Surgeon: Angelia Mould, MD;  Location: [place];  Service: Vascular;  Laterality: Left;  . WOUND EXPLORATION Left 06/03/2017   Procedure: WOUND EXPLORATION WITH WOUND VAC  APPLICATION TO LEFT ARM;  Surgeon: Angelia Mould, MD;  Location: [facility] OR;  Service: Vascular;  Laterality: Left;       Family History  Problem Relation Age of Onset  . Heart disease Mother   . Lung cancer Mother   . Heart disease Father   . Malignant hyperthermia Father   . COPD Father   . Throat cancer Sister   . Esophageal cancer Sister   . Hypertension Other   . COPD Other   . Colon cancer Neg Hx   . Colon polyps Neg Hx   . Rectal cancer Neg Hx   . Stomach cancer Neg Hx     Social History   Tobacco Use  . Smoking status: Current Every Day Smoker    Packs/day: 0.50    Years: 43.00    Pack years: 21.50    Types: Cigarettes    Start date: 08/13/1973  . Smokeless tobacco: Never Used  . Tobacco comment: i dont know i just make   Vaping Use  . Vaping Use: Never used  Substance Use Topics  . Alcohol use: Not Currently    Comment: quit drinking in 2017  . Drug use: Not Currently    Types: Marijuana, Cocaine    Comment: reports using once every 3 months, 04-06-2019 was this     Home Medications Prior to Admission medications   Medication Sig Start Date End Date Taking? Authorizing Provider  cefdinir (OMNICEF) 300 MG capsule Take 1 capsule (300 mg total) by mouth daily. 01/14/20   Candee Furbish, MD  gabapentin (NEURONTIN) 100 MG capsule Take 1 capsule (100 mg total) by mouth 3 (three) times daily for 90 doses. 12/27/19 01/26/20  Adrian Prows, MD  hydrALAZINE (APRESOLINE) 100 MG tablet Take 100 mg by mouth 2 (two) times daily.    [provider]  hydrOXYzine (ATARAX/VISTARIL) 25 MG tablet Take 1 tablet (25 mg total) by mouth 2 (two) times daily as needed for itching. 01/02/20   Adrian Prows, MD  lactulose (CHRONULAC) 10 GM/15ML solution Take 45 mLs (30 g total) by mouth daily as needed for mild constipation. 10/01/19   [name], MD  metoprolol tartrate (LOPRESSOR) 100 MG tablet Take 1 tablet (100 mg total) by mouth 2 (two) times daily. 06/04/19   Al Decant, MD  ondansetron (ZOFRAN) 4 MG tablet Take 4 mg by mouth every 4 (four) hours as needed for nausea or vomiting.  09/18/19   [provider]  oxyCODONE 10 MG TABS Take 1 tablet (10 mg total) by mouth 3 (three) times daily as needed for up to 14 days for severe pain. 01/14/20 01/28/20  Candee Furbish, MD  pantoprazole (PROTONIX) 40 MG tablet Take 1 tablet (40 mg total) by mouth daily before breakfast. 12/27/19   Adrian Prows, MD  sevelamer carbonate (RENVELA) 800 MG tablet Take 1,600-3,200 mg by mouth See admin instructions. Take 4 tablets (3200 mg) by mouth 3 times daily with meals and 2 tablets (1600 mg) with snacks    [provider]  sodium polystyrene (KAYEXALATE) 15 GM/60ML suspension Take 120 mLs (30 g total) by mouth daily as needed (constipation). 01/14/20   Candee Furbish, MD    Allergies    Tramadol, Morphine and related, Pollen extract, Acetaminophen, Aspirin, and Clonidine derivatives  Review of Systems   Review of Systems  Respiratory: Positive for cough and shortness of breath.   Cardiovascular: Positive for chest pain (burning).  All other systems reviewed and are negative.   Physical Exam Updated Vital Signs BP (!) 129/91 (BP Location: Left Arm)   Pulse (!) 144   Temp 98.8 F (37.1 C) (Oral)   Resp (!) 22   Wt 61.2 kg   SpO2 99%   BMI 19.92 kg/m   Physical Exam Vitals and nursing note reviewed.  Constitutional:      General: He is not in acute distress.    Appearance: He is well-developed.     Comments: Asleep.  Easily arousable.  HENT:     Head: Normocephalic and atraumatic.     Right Ear: External ear normal.     Left Ear: External ear normal.     Nose: Nose normal.  Eyes:     General: No scleral icterus.    Conjunctiva/sclera: Conjunctivae normal.  Cardiovascular:     Rate and Rhythm: Tachycardia present. Rhythm irregular.     Heart sounds: Normal heart sounds. No murmur heard.      Comments: Heart rate 100-120 on exam.  Feels  irregular.  1+ radial and DP pulses bilaterally.  Minimal/trace pitting edema pretibially.  No leg asymmetry.  No calf tenderness. Pulmonary:     Effort: Pulmonary effort is normal.     Breath sounds: Wheezing and rhonchi present.     Comments: Slightly tachypneic.  Mild increased respiratory effort, some accessory, abdominal use with breathing.  SPO2 greater than 99% but patient is on 3 L [place].  This was discontinued during encounter and SPO2 maintained greater than 91%.  Diffuse inspiratory and expiratory wheezing with some rhonchorous sounds in all lung fields.  Wet sounding cough during exam. Musculoskeletal:        General: No deformity. Normal range of motion.     Cervical back: Normal range of motion and neck supple.  Skin:    General: Skin is warm and dry.     Capillary Refill: Capillary refill takes less than 2 seconds.  Neurological:     Mental Status: He is alert and oriented to person, place, and time.  Psychiatric:        Behavior: Behavior normal.        Thought Content: Thought content normal.  Judgment: Judgment normal.     ED Results / Procedures / Treatments   Labs (all labs ordered are listed, but only abnormal results are displayed) Labs Reviewed  CBC WITH DIFFERENTIAL/PLATELET - Abnormal; Notable for the following components:      Result Value   WBC 12.7 (*)    RBC 4.16 (*)    Hemoglobin 11.4 (*)    HCT 33.3 (*)    RDW 15.7 (*)    Neutro Abs 10.3 (*)    Monocytes Absolute 1.1 (*)    Abs Immature Granulocytes 0.11 (*)    All other components within normal limits  COMPREHENSIVE METABOLIC PANEL - Abnormal; Notable for the following components:   Sodium 131 (*)    Potassium 6.5 (*)    Chloride 90 (*)    BUN 31 (*)    Creatinine, Ser 4.38 (*)    Albumin 2.5 (*)    AST 108 (*)    ALT 81 (*)    Alkaline Phosphatase 421 (*)    GFR calc non Af Amer 14 (*)    GFR calc Af Amer 16 (*)    All other components within normal limits  SARS CORONAVIRUS 2 BY RT  PCR [facility] ORDER, [place] LAB)    EKG EKG Interpretation  Date/Time:  Saturday January 19 2020 07:11:32 EDT Ventricular Rate:  118 PR Interval:    QRS Duration: 189 QT Interval:  412 QTC Calculation: 545 R Axis:   -102 Text Interpretation: Atrial fibrillation IVCD, consider atypical LBBB Since last tracing rate faster Confirmed by Dorie Rank [phone]) on 01/19/2020 8:19:45 AM   Radiology DG Chest 2 View  Result Date: 01/19/2020 CLINICAL DATA:  Shortness of breath, productive cough. Dialysis yesterday. EXAM: CHEST - 2 VIEW COMPARISON:  Chest x-rays dated 01/14/2020 and 01/13/2020. Chest CT angiogram dated 01/14/2020. FINDINGS: Stable cardiomegaly. Lungs are clear. No pleural effusion or pneumothorax is seen. IMPRESSION: 1. Lungs are clear. No evidence of pneumonia or pulmonary edema on today's exam. 2. Stable cardiomegaly. Electronically Signed   By: Franki Cabot M.D.   On: 01/19/2020 08:22   IR Paracentesis  Result Date: 01/17/2020 INDICATION: Patient with history end-stage renal disease, cirrhosis, hepatitis B/C, recurrent ascites ;request made for therapeutic paracentesis EXAM: ULTRASOUND GUIDED THERAPEUTIC  PARACENTESIS MEDICATIONS: None COMPLICATIONS: None immediate. PROCEDURE: Informed written consent was obtained from the patient after a discussion of the risks, benefits and alternatives to treatment. A timeout was performed prior to the initiation of the procedure. Initial ultrasound scanning demonstrates a moderate amount of ascites within the left lower abdominal quadrant. The left lower abdomen was prepped and draped in the usual sterile fashion. 1% lidocaine was used for local anesthesia. Following this, a 6 Fr Safe-T-Centesis catheter was introduced. An ultrasound image was saved for documentation purposes. The paracentesis was performed. The catheter was removed and a dressing was applied. The patient tolerated the procedure well without immediate post  procedural complication. FINDINGS: A total of approximately 3.2 liters of hazy,amber fluid was removed. IMPRESSION: Successful ultrasound-guided therapeutic paracentesis yielding 3.2 liters of peritoneal fluid. Read by: Lindaann Pascal Electronically Signed   By: Markus Daft M.D.   On: 01/17/2020 13:42    Procedures .Critical Care Performed by: Kinnie Feil, PA-C Authorized by: Kinnie Feil, PA-C   Critical care provider statement:    Critical care time (minutes):  45   Critical care was necessary to treat or prevent imminent or life-threatening deterioration of the following conditions: hyperkalemia, atrial  fibrillation with RVR, COPD exacerbation.   Critical care was time spent personally by me on the following activities:  Discussions with consultants, evaluation of patient's response to treatment, examination of patient, ordering and performing treatments and interventions, ordering and review of laboratory studies, ordering and review of radiographic studies, pulse oximetry, re-evaluation of patient's condition, obtaining history from patient or surrogate and review of old charts   (including critical care time)  Medications Ordered in ED Medications  diltiazem (CARDIZEM) 1 mg/mL load via infusion 15 mg (has no administration in time range)    And  diltiazem (CARDIZEM) 125 mg in dextrose 5% 125 mL (1 mg/mL) infusion (has no administration in time range)  sodium polystyrene (KAYEXALATE) 15 GM/60ML suspension 45 g (has no administration in time range)  LORazepam (ATIVAN) injection 1 mg (has no administration in time range)  ipratropium (ATROVENT HFA) inhaler 4 puff (4 puffs Inhalation Given 01/19/20 0846)  AeroChamber Plus Flo-Vu Medium MISC 1 each (1 each Other Given 01/19/20 0949)  magnesium sulfate IVPB 1 g 100 mL (1 g Intravenous New Bag/Given 01/19/20 0953)  methylPREDNISolone sodium succinate (SOLU-MEDROL) 125 mg/2 mL injection 125 mg (125 mg Intravenous Given 01/19/20  0831)  albuterol (VENTOLIN HFA) 108 (90 Base) MCG/ACT inhaler 8 puff (8 puffs Inhalation Given 01/19/20 0845)    ED Course  I have reviewed the triage vital signs and the nursing notes.  Pertinent labs & imaging results that were available during my care of the patient were reviewed by me and considered in my medical decision making (see chart for details).  Clinical Course as of Jan 19 1107  Sat Jan 19, 2020  0900 WBC(!): 12.7 [CG]  0900 Hemoglobin(!): 11.4 [CG]  0900 HCT(!): 33.3 [CG]  0900 DG Chest 2 View [CG]  0901 IMPRESSION: 1. Lungs are clear. No evidence of pneumonia or pulmonary edema on today's exam. 2. Stable cardiomegaly.   [CG]  0901 Atrial fibrillation IVCD, consider atypical LBBB Since last tracing rate faster Confirmed by Dorie Rank [phone]) on 01/19/2020 8:19:45 AM  ED EKG [CG]  0901 HR 118 QTC 545  ED EKG [CG]  0939 5.8 on 7/6  Potassium(!!): 6.5 [CG]  0939 4.12 on 7/6  Creatinine(!): 4.38 [CG]  0939 Alkaline Phosphatase(!): 421 [CG]  0951 Reevaluated patient.  He had his breathing treatments.  Reports no improvement in his breathing.  Continues to be tachypneic in the mid 20s.  Heart rate now 144.  Blood pressure normal.  SPO2 greater than 95% without supplemental oxygen.   [CG]  1008 Spoke to Dr. Burnett Sheng with renal who recommends Kayexalate 45 g, recheck K around 4 PM today.  No anticipated need for emergent dialysis unless K is not improving and can consider dialysis later tonight or tomorrow.   [CG]  1107 AST(!): 108 [CG]  1107 ALT(!): 81 [CG]  1107 Alkaline Phosphatase(!): 421 [CG]    Clinical Course User Index [CG] Kinnie Feil, PA-C   MDM Rules/Calculators/A&P                          This patient complains of shortness of breath, cough with changes in sputum, burning in his chest.  Onset 24 hours ago.  Fully dialyzed yesterday.  Had paracentesis 2 days ago.  Continues to smoke cigarettes.  I reviewed patient's available medical records to  assist with obtaining more history and MDM.  I have reviewed triage and nursing notes.    In summary, patient with atrial  fibrillation with RVR in the past requiring metoprolol, amiodarone, admission.  Discharged from the hospital he just a few days ago, per discharge noted he was hypoxic at that time.  Thought to be related to some pulmonary edema due to A. fib with RVR.  He was weaned to room air and patient refused further treatment and dialysis.  Does not appear to be in anticoagulants.  Echo March 2021 shows severe LVH with LVEF 50 to 55%, severe right ventricular systolic function, severely elevated PA systolic pressure.  Chief complain involves an extensive number of treatment options and is a complaint that carries with it a high risk of complications and morbidity and mortality  Patient arrives with atrial fibrillation with HR in the 100s to 120s.  He is tachypneic and with diffuse wheezing and rhonchi on auscultation.  Only trace pitting edema bilaterally.  His dyspnea could be multifactorial.  Differential diagnosis includes COPD exacerbation that has led to atrial fibrillation with RVR.  Also could be due to pulmonary edema although he does not to be overtly hypervolemic and had recent dialysis, paracentesis.  Doubt PE given exam.   I ordered laboratory studies as above.  I ordered chest x-ray, EKG.  I ordered continuous cardiac monitoring and pulse ox.  Patient appears dyspneic but maintaining SPO2 greater than 90% here at rest.  I ordered, reviewed and personally visualized and interpreted the above labs and/or imaging studies.  ER work up reveals hyperkalemia K6.5, elevated from previous.  Creatinine at his baseline.  Chronic elevated LFTs.  Mild leukocytosis.  Chest x-ray without edema, infiltrate.  EKG shows atrial fibrillation with HR 118, prolonged QTC.  I have ordered breathing treatments, methylprednisone, magnesium, Lokelma,.  Patient discussed with EDP.  We will initiate  Cardizem bolus and infusion.    Patient discussed with nephrology Dr. Burnett Sheng.  He recommends Kayexalate, recheck of K.  No indication for emergent dialysis here.  Patient reevaluated and heart rate in the 140s, pending Cardizem.  Reports no improvement in breathing.  Continues to have tachypnea, wheezing.  We will consult medicine for admission of likely COPD exacerbation, A. fib with RVR and hyperkalemia.  COVID pending.   Dr Tamala Julian to admit.   Final Clinical Impression(s) / ED Diagnoses Final diagnoses:  Atrial fibrillation with tachycardic ventricular rate [facility])    Rx / DC Orders ED Discharge Orders    None       Arlean Hopping 01/19/20 1108    Dorie Rank, MD 01/19/20 1715

## 2020-01-19 NOTE — Progress Notes (Signed)
Patient arrived from ER via stretcher transported by ER nurse, Magda Paganini. Complained of pain in abdomen. Patient told he had pain medication in the ER and we needed to wait and see if it helped. Dr. Tamala Julian notified of patients continued pain and reviewing his home medication noted that patient was taking his usual dose of medication and had a dose of po dilaudid for pain prior to transport from ER.  Patient stating he needed the liquid dilaudid in his IV. I informed the patient that Dr. Tamala Julian had ordered his usual home pain medication and dosage but I would page him again. Patient then stated he had nausea and wanted medication for nausea. No medication was ordered.  Notified Dr. Tamala Julian through [facility] page that patient still complainting of pain and request for dilaudid, complained of nausea and had a repeat K of 7.3. Also requested changing albuterol neb to xopenex because heart rate remains around 120.  Report given to Hemodialysis staff member and also to [employer].

## 2020-01-19 NOTE — ED Notes (Signed)
Pt taken to bathroom via wheelchair, refused to use a bed pan or BSC.

## 2020-01-19 NOTE — ED Triage Notes (Signed)
Per [facility] pt called out for SOB and productive cough with white colored sputum starting yesterday, pt received a full treatment of dialysis yesterday (M/W/F)   22g LFA

## 2020-01-19 NOTE — H&P (Addendum)
History and Physical    [name] ERD:[id number] DOB: 14-Oct-1960 DOA: 01/19/2020  Referring MD/NP/PA: [name], PA-C PCP: [name], FNP  Patient coming from: Via EMS  Chief Complaint: Cough and shortness of breath  I have personally briefly reviewed patient's old medical records in [place]   HPI: [name] is a 59 y.o. male with medical history significant of hypertension ESRD on HD(MWF), CAD(last cath 2018 S/P DES to RCA), PAF, cirrhosis, hep B & C, EtOH abuse, COPD, and tobacco abuse presents with complaints of cough and shortness of breath.  He complains of having a productive cough with clear sputum production since yesterday.  He complains of associated symptoms of severe abdominal pain, abdominal distention, and heartburn.  Patient reports that he completed his full hemodialysis treatment yesterday and received his first COVID-19 vaccine.  He admits to continued smoking less than 1 pack cigarettes per day on average, but declines need of nicotine patch.  ED Course: Upon admission into the emergency department patient was seen to be afebrile in atrial fibrillation with heart rates up to 144, respirations 22, blood pressures 122/91-157/135, and O2 saturation currently maintained on 3 L of nasal cannula oxygen.  Labs significant for WBC 12.7, hemoglobin 11.4, sodium 131, potassium 6.5, BUN 31, and creatinine 4.38.  Chest x-ray showed stable cardiomegaly without signs of edema or infiltrate.  Nephrology was formally consulted, patient was given 125 mg of Solu-Medrol IV, 45 g of Kayexalate, albuterol, and ipratropium.  Plan was for patient to be started on a diltiazem drip for rate control, but he converted back into sinus rhythm after using the restroom.  Patient would not allow repeat EKG to be performed.  TRH called to admit.  Review of Systems  Constitutional: Negative for fever.  HENT: Negative for hearing loss.   Eyes: Negative for  photophobia and pain.  Respiratory: Positive for cough, sputum production, shortness of breath and wheezing.   Cardiovascular: Negative for chest pain and leg swelling.  Gastrointestinal: Positive for abdominal pain and heartburn. Negative for blood in stool, nausea and vomiting.  Genitourinary: Negative for dysuria and frequency.  Musculoskeletal: Negative for falls.  Neurological: Negative for focal weakness and loss of consciousness.  Endo/Heme/Allergies: Negative for polydipsia.  Psychiatric/Behavioral: Positive for substance abuse. Negative for memory loss.    Past Medical History:  Diagnosis Date  . Anemia   . Anxiety   . Arthritis    left shoulder  . Atherosclerosis of aorta ([place])   . Cardiomegaly   . Chest pain    DATE UNKNOWN, C/O PERIODICALLY  . Cocaine abuse ([place])   . COPD exacerbation ([place]) 08/17/2016  . Coronary artery disease    stent 02/22/17  . ESRD (end stage renal disease) on dialysis ([place])    "[name]; MWF" (07/04/2017)  . GERD (gastroesophageal reflux disease)    DATE UNKNOWN  . Hemorrhoids   . Hepatitis B, chronic ([place])   . Hepatitis C   . History of kidney stones   . Hyperkalemia   . Hypertension   . Kidney failure   . Metabolic bone disease    Patient denies  . Mitral stenosis   . Myocardial infarction ([facility])   . Pneumonia   . Pulmonary edema   . Solitary rectal ulcer syndrome 07/2017   at flex sig for rectal bleeding  . Tubular adenoma of colon     Past Surgical History:  Procedure Laterality Date  . A/V FISTULAGRAM Left 05/26/2017  Procedure: A/V FISTULAGRAM;  Surgeon: Conrad Ryderwood, MD;  Location: [facility];  Service: Cardiovascular;  Laterality: Left;  . A/V FISTULAGRAM Right 11/18/2017   Procedure: A/V FISTULAGRAM - Right Arm;  Surgeon: Elam Dutch, MD;  Location: [facility];  Service: Cardiovascular;  Laterality: Right;  . APPLICATION OF WOUND VAC Left 06/14/2017   Procedure: APPLICATION OF WOUND VAC;  Surgeon:  Katha Cabal, MD;  Location: [facility];  Service: Vascular;  Laterality: Left;  . AV FISTULA PLACEMENT  2012   BELIEVED WAS PLACED IN JUNE  . AV FISTULA PLACEMENT Right 08/09/2017   Procedure: Creation Right arm ARTERIOVENOUS BRACHIOCEPOHALIC FISTULA;  Surgeon: Elam Dutch, MD;  Location: [facility];  Service: Vascular;  Laterality: Right;  . AV FISTULA PLACEMENT Right 11/22/2017   Procedure: INSERTION OF ARTERIOVENOUS (AV) GORE-TEX GRAFT RIGHT UPPER ARM;  Surgeon: Elam Dutch, MD;  Location: [facility];  Service: Vascular;  Laterality: Right;  . BIOPSY  01/25/2018   Procedure: BIOPSY;  Surgeon: Jerene Bears, MD;  Location: [place];  Service: Gastroenterology;;  . BIOPSY  04/10/2019   Procedure: BIOPSY;  Surgeon: Jerene Bears, MD;  Location: [facility];  Service: Gastroenterology;;  . COLONOSCOPY    . COLONOSCOPY WITH PROPOFOL N/A 01/25/2018   Procedure: COLONOSCOPY WITH PROPOFOL;  Surgeon: Jerene Bears, MD;  Location: [place];  Service: Gastroenterology;  Laterality: N/A;  . CORONARY STENT INTERVENTION N/A 02/22/2017   Procedure: CORONARY STENT INTERVENTION;  Surgeon: Nigel Mormon, MD;  Location: [facility];  Service: Cardiovascular;  Laterality: N/A;  . ESOPHAGOGASTRODUODENOSCOPY (EGD) WITH PROPOFOL N/A 01/25/2018   Procedure: ESOPHAGOGASTRODUODENOSCOPY (EGD) WITH PROPOFOL;  Surgeon: Jerene Bears, MD;  Location: [place] [facility];  Service: Gastroenterology;  Laterality: N/A;  . ESOPHAGOGASTRODUODENOSCOPY (EGD) WITH PROPOFOL N/A 04/10/2019   Procedure: ESOPHAGOGASTRODUODENOSCOPY (EGD) WITH PROPOFOL;  Surgeon: Jerene Bears, MD;  [facility];  Service: Gastroenterology;  Laterality: N/A;  . FLEXIBLE SIGMOIDOSCOPY N/A 07/15/2017   Procedure: FLEXIBLE SIGMOIDOSCOPY;  Surgeon: Carol Ada, MD;  Location: [facility];  Service: Endoscopy;  Laterality: N/A;  . HEMORRHOID BANDING    . I & D EXTREMITY Left 06/01/2017   Procedure: IRRIGATION AND DEBRIDEMENT LEFT  ARM HEMATOMA WITH LIGATION OF LEFT ARM AV FISTULA;  Surgeon: Elam Dutch, MD;  Location: [facility];  Service: Vascular;  Laterality: Left;  . I & D EXTREMITY Left 06/14/2017   Procedure: IRRIGATION AND DEBRIDEMENT EXTREMITY;  Surgeon: Katha Cabal, MD;  Location: [facility];  Service: Vascular;  Laterality: Left;  . INSERTION OF DIALYSIS CATHETER  05/30/2017  . INSERTION OF DIALYSIS CATHETER N/A 05/30/2017   Procedure: INSERTION OF DIALYSIS CATHETER;  Surgeon: Elam Dutch, MD;  Location: [facility];  Service: Vascular;  Laterality: N/A;  . IR PARACENTESIS  08/30/2017  . IR PARACENTESIS  09/29/2017  . IR PARACENTESIS  10/28/2017  . IR PARACENTESIS  11/09/2017  . IR PARACENTESIS  11/16/2017  . IR PARACENTESIS  11/28/2017  . IR PARACENTESIS  12/01/2017  . IR PARACENTESIS  12/06/2017  . IR PARACENTESIS  01/03/2018  . IR PARACENTESIS  01/23/2018  . IR PARACENTESIS  02/07/2018  . IR PARACENTESIS  02/21/2018  . IR PARACENTESIS  03/06/2018  . IR PARACENTESIS  03/17/2018  . IR PARACENTESIS  04/04/2018  . IR PARACENTESIS  12/28/2018  . IR PARACENTESIS  01/08/2019  . IR PARACENTESIS  01/23/2019  . IR PARACENTESIS  02/01/2019  . IR PARACENTESIS  02/19/2019  .  IR PARACENTESIS  03/01/2019  . IR PARACENTESIS  03/15/2019  . IR PARACENTESIS  04/03/2019  . IR PARACENTESIS  04/12/2019  . IR PARACENTESIS  05/01/2019  . IR PARACENTESIS  05/08/2019  . IR PARACENTESIS  05/24/2019  . IR PARACENTESIS  06/12/2019  . IR PARACENTESIS  07/09/2019  . IR PARACENTESIS  07/27/2019  . IR PARACENTESIS  08/09/2019  . IR PARACENTESIS  08/21/2019  . IR PARACENTESIS  09/17/2019  . IR PARACENTESIS  10/05/2019  . IR PARACENTESIS  10/29/2019  . IR PARACENTESIS  11/08/2019  . IR PARACENTESIS  12/12/2019  . IR PARACENTESIS  01/03/2020  . IR PARACENTESIS  01/10/2020  . IR PARACENTESIS  01/17/2020  . IR RADIOLOGIST EVAL & MGMT  02/14/2018  . IR RADIOLOGIST EVAL & MGMT  02/22/2019  . LEFT HEART CATH AND CORONARY ANGIOGRAPHY N/A 02/22/2017   Procedure: LEFT  HEART CATH AND CORONARY ANGIOGRAPHY;  Surgeon: Nigel Mormon, MD;  Location: [facility];  Service: Cardiovascular;  Laterality: N/A;  . LIGATION OF ARTERIOVENOUS  FISTULA Left 07/17/1094   Procedure: Plication of Left Arm Arteriovenous Fistula;  Surgeon: Elam Dutch, MD;  Location: [facility];  Service: Vascular;  Laterality: Left;  . POLYPECTOMY    . POLYPECTOMY  01/25/2018   Procedure: POLYPECTOMY;  Surgeon: Jerene Bears, MD;  Location: [place];  Service: Gastroenterology;;  . REVISON OF ARTERIOVENOUS FISTULA Left 0/45/4098   Procedure: PLICATION OF DISTAL ANEURYSMAL SEGEMENT OF LEFT UPPER ARM ARTERIOVENOUS FISTULA;  Surgeon: Elam Dutch, MD;  Location: [facility];  Service: Vascular;  Laterality: Left;  . REVISON OF ARTERIOVENOUS FISTULA Left 07/30/1476   Procedure: Plication of Left Upper Arm Fistula ;  Surgeon: Waynetta Sandy, MD;  Location: [place];  Service: Vascular;  Laterality: Left;  . SKIN GRAFT SPLIT THICKNESS LEG / FOOT Left    SKIN GRAFT SPLIT THICKNESS LEFT ARM DONOR SITE: LEFT ANTERIOR THIGH  . SKIN SPLIT GRAFT Left 07/04/2017   Procedure: SKIN GRAFT SPLIT THICKNESS LEFT ARM DONOR SITE: LEFT ANTERIOR THIGH;  Surgeon: Elam Dutch, MD;  Location: [place];  Service: Vascular;  Laterality: Left;  . THROMBECTOMY W/ EMBOLECTOMY Left 06/05/2017   Procedure: EXPLORATION OF LEFT ARM FOR BLEEDING; OVERSEWED PROXIMAL FISTULA;  Surgeon: Angelia Mould, MD;  Location: [place];  Service: Vascular;  Laterality: Left;  . WOUND EXPLORATION Left 06/03/2017   Procedure: WOUND EXPLORATION WITH WOUND VAC APPLICATION TO LEFT ARM;  Surgeon: Angelia Mould, MD;  Location: [place];  Service: Vascular;  Laterality: Left;     reports that he has been smoking cigarettes. He started smoking about 46 years ago. He has a 21.50 pack-year smoking history. He has never used smokeless tobacco. He reports previous alcohol use. He reports previous drug use. Drugs: Marijuana  and Cocaine.  Allergies  Allergen Reactions  . Tramadol Itching and Other (See Comments)  . Morphine And Related Other (See Comments)    Stomach pain  . Pollen Extract     Other reaction(s): Sneezing (finding)  . Acetaminophen Nausea Only    Stomach ache Other reaction(s): Other (See Comments) Stomach ache  . Aspirin Other (See Comments) and Itching    STOMACH PAIN Other reaction(s): Unknown STOMACH PAIN  . Clonidine Derivatives Itching    Family History  Problem Relation Age of Onset  . Heart disease Mother   . Lung cancer Mother   . Heart disease Father   . Malignant hyperthermia Father   . COPD Father   .  Throat cancer Sister   . Esophageal cancer Sister   . Hypertension Other   . COPD Other   . Colon cancer Neg Hx   . Colon polyps Neg Hx   . Rectal cancer Neg Hx   . Stomach cancer Neg Hx     Prior to Admission medications   Medication Sig Start Date End Date Taking? Authorizing Provider  cefdinir (OMNICEF) 300 MG capsule Take 1 capsule (300 mg total) by mouth daily. 01/14/20   Candee Furbish, MD  gabapentin (NEURONTIN) 100 MG capsule Take 1 capsule (100 mg total) by mouth 3 (three) times daily for 90 doses. 12/27/19 01/26/20  Adrian Prows, MD  hydrALAZINE (APRESOLINE) 100 MG tablet Take 100 mg by mouth 2 (two) times daily.    [provider]  hydrOXYzine (ATARAX/VISTARIL) 25 MG tablet Take 1 tablet (25 mg total) by mouth 2 (two) times daily as needed for itching. 01/02/20   Adrian Prows, MD  lactulose (CHRONULAC) 10 GM/15ML solution Take 45 mLs (30 g total) by mouth daily as needed for mild constipation. 10/01/19   [name], MD  metoprolol tartrate (LOPRESSOR) 100 MG tablet Take 1 tablet (100 mg total) by mouth 2 (two) times daily. 06/04/19   Al Decant, MD  ondansetron (ZOFRAN) 4 MG tablet Take 4 mg by mouth every 4 (four) hours as needed for nausea or vomiting.  09/18/19   [provider]  oxyCODONE 10 MG TABS Take 1 tablet (10 mg total) by  mouth 3 (three) times daily as needed for up to 14 days for severe pain. 01/14/20 01/28/20  Candee Furbish, MD  pantoprazole (PROTONIX) 40 MG tablet Take 1 tablet (40 mg total) by mouth daily before breakfast. 12/27/19   Adrian Prows, MD  sevelamer carbonate (RENVELA) 800 MG tablet Take 1,600-3,200 mg by mouth See admin instructions. Take 4 tablets (3200 mg) by mouth 3 times daily with meals and 2 tablets (1600 mg) with snacks    [provider]  sodium polystyrene (KAYEXALATE) 15 GM/60ML suspension Take 120 mLs (30 g total) by mouth daily as needed (constipation). 01/14/20   Candee Furbish, MD    Physical Exam:  Constitutional: Middle-age male who appears older than stated age currently eating breakfast Vitals:   01/19/20 0847 01/19/20 0900 01/19/20 0930 01/19/20 0948  BP:  (!) 122/91 (!) 129/91 (!) 129/91  Pulse:  (!) 45 90 (!) 144  Resp:  (!) 22 18 (!) 22  Temp:      TempSrc:      SpO2: 97% 94% 99% 99%  Weight:       Eyes: PERRL, lids and conjunctivae normal ENMT: Mucous membranes are moist. Posterior pharynx clear of any exudate or lesions.   Neck: normal, supple, no masses, no thyromegaly Respiratory: Normal respiratory effort currently on 3 L nasal cannula oxygen with expiratory wheezes appreciated in both lung fields.  No rales or rhonchi appreciated.  Patient able to talk in fairly complete sentences. Cardiovascular: Regular rate and rhythm.  No extremity edema. 2+ pedal pulses. No carotid bruits.  Abdomen: Distended abdomen with generalized tenderness to palpation, no masses palpated. No hepatosplenomegaly. Bowel sounds positive.  Musculoskeletal: no clubbing / cyanosis. No joint deformity upper and lower extremities. Good ROM, no contractures. Normal muscle tone.  Skin: no rashes, lesions, ulcers. No induration Neurologic: CN 2-12 grossly intact. Sensation intact, DTR normal. Strength 5/5 in all 4.  Psychiatric: Normal judgment and insight. Alert and oriented x 3. Normal  mood.  Labs on Admission: I have personally reviewed following labs and imaging studies  CBC: Recent Labs  Lab 01/13/20 1559 01/14/20 0023 01/14/20 1800 01/15/20 0838 01/19/20 0838  WBC 12.8* 12.9* 11.0* 11.9* 12.7*  NEUTROABS 10.1*  --  9.0* 11.2* 10.3*  HGB 12.0* 11.0* 10.5* 11.4* 11.4*  HCT 35.5* 31.6* 31.0* 33.6* 33.3*  MCV 81.6 79.4* 81.2 82.4 80.0  PLT 283 196 215 207 017   Basic Metabolic Panel: Recent Labs  Lab 01/13/20 1559 01/14/20 0023 01/14/20 1800 01/15/20 0838 01/19/20 0838  NA 127* 132* 133* 129* 131*  K 7.2* 4.2 4.1 5.8* 6.5*  CL 90* 95* 91* 93* 90*  CO2 21* 24 27 22 28   GLUCOSE 118* 124* 97 145* 97  BUN 38* 15 19 25* 31*  CREATININE 5.77* 3.15* 3.32* 4.12* 4.38*  CALCIUM 9.4 9.0 9.0 9.8 9.3  MG 2.2 2.0  --  2.1  --   PHOS  --  3.9  --   --   --    GFR: Estimated Creatinine Clearance: 16.1 mL/min (A) (by C-G formula based on SCr of 4.38 mg/dL (H)). Liver Function Tests: Recent Labs  Lab 01/13/20 1559 01/14/20 1926 01/15/20 0838 01/19/20 0838  AST 54* 46* 39 108*  ALT 39 37 33 81*  ALKPHOS 314* 337* 308* 421*  BILITOT 1.1 0.7 0.8 1.0  PROT 6.8 7.0 6.9 6.8  ALBUMIN 2.6* 2.6* 2.5* 2.5*   Recent Labs  Lab 01/13/20 1559  LIPASE 27   No results for input(s): AMMONIA in the last 168 hours. Coagulation Profile: Recent Labs  Lab 01/13/20 1559  INR 1.2   Cardiac Enzymes: No results for input(s): CKTOTAL, CKMB, CKMBINDEX, TROPONINI in the last 168 hours. BNP (last 3 results) No results for input(s): PROBNP in the last 8760 hours. HbA1C: No results for input(s): HGBA1C in the last 72 hours. CBG: Recent Labs  Lab 01/13/20 1545 01/13/20 1851 01/14/20 0023 01/14/20 0730  GLUCAP 139* 64* 122* 120*   Lipid Profile: No results for input(s): CHOL, HDL, LDLCALC, TRIG, CHOLHDL, LDLDIRECT in the last 72 hours. Thyroid Function Tests: No results for input(s): TSH, T4TOTAL, FREET4, T3FREE, THYROIDAB in the last 72 hours. Anemia  Panel: No results for input(s): VITAMINB12, FOLATE, FERRITIN, TIBC, IRON, RETICCTPCT in the last 72 hours. Urine analysis:    Component Value Date/Time   COLORURINE YELLOW 07/16/2011 1619   APPEARANCEUR CLEAR 07/16/2011 1619   LABSPEC 1.018 07/16/2011 1619   PHURINE 7.5 07/16/2011 1619   GLUCOSEU 250 (A) 07/16/2011 1619   HGBUR MODERATE (A) 07/16/2011 1619   BILIRUBINUR SMALL (A) 07/16/2011 1619   KETONESUR NEGATIVE 07/16/2011 1619   PROTEINUR >300 (A) 07/16/2011 1619   UROBILINOGEN 1.0 07/16/2011 1619   NITRITE NEGATIVE 07/16/2011 1619   LEUKOCYTESUR SMALL (A) 07/16/2011 1619   Sepsis Labs: Recent Results (from the past 240 hour(s))  SARS Coronavirus 2 by RT PCR (hospital order, performed in [facility] hospital lab) Nasopharyngeal Nasopharyngeal Swab     Status: None   Collection Time: 01/13/20  4:08 PM   Specimen: Nasopharyngeal Swab  Result Value Ref Range Status   SARS Coronavirus 2 NEGATIVE NEGATIVE Final    Comment: (NOTE) SARS-CoV-2 target nucleic acids are NOT DETECTED.  The SARS-CoV-2 RNA is generally detectable in upper and lower respiratory specimens during the acute phase of infection. The lowest concentration of SARS-CoV-2 viral copies this assay can detect is 250 copies / mL. A negative result does not preclude SARS-CoV-2 infection and should not be used as  the sole basis for treatment or other patient management decisions.  A negative result may occur with improper specimen collection / handling, submission of specimen other than nasopharyngeal swab, presence of viral mutation(s) within the areas targeted by this assay, and inadequate number of viral copies (<250 copies / mL). A negative result must be combined with clinical observations, patient history, and epidemiological information.  Fact Sheet for Patients:   StrictlyIdeas.no  Fact Sheet for Healthcare Providers: BankingDealers.co.za  This test is not  yet approved or  cleared by the [place] FDA and has been authorized for detection and/or diagnosis of SARS-CoV-2 by FDA under an Emergency Use Authorization (EUA).  This EUA will remain in effect (meaning this test can be used) for the duration of the COVID-19 declaration under Section 564(b)(1) of the Act, 21 U.S.C. section 360bbb-3(b)(1), unless the authorization is terminated or revoked sooner.  Performed at [facility], [place] [address], [place], [place] [address]   Culture, blood (routine x 2)     Status: None   Collection Time: 01/13/20  5:29 PM   Specimen: BLOOD  Result Value Ref Range Status   Specimen Description BLOOD BLOOD LEFT WRIST  Final   Special Requests   Final    BOTTLES DRAWN AEROBIC AND ANAEROBIC Blood Culture adequate volume   Culture   Final    NO GROWTH 5 DAYS Performed at [facility], [place] [address], [place], [place] [address]    Report Status 01/18/2020 FINAL  Final  Culture, blood (routine x 2)     Status: None (Preliminary result)   Collection Time: 01/14/20 12:23 AM   Specimen: BLOOD LEFT HAND  Result Value Ref Range Status   Specimen Description BLOOD LEFT HAND  Final   Special Requests   Final    BOTTLES DRAWN AEROBIC ONLY Blood Culture adequate volume   Culture   Final    NO GROWTH 4 DAYS Performed at [facility], [place] [address] [place], [place] [address]    Report Status PENDING  Incomplete  SARS Coronavirus 2 by RT PCR (hospital order, performed in [facility] hospital lab) Nasopharyngeal Nasopharyngeal Swab     Status: None   Collection Time: 01/14/20  9:37 PM   Specimen: Nasopharyngeal Swab  Result Value Ref Range Status   SARS Coronavirus 2 NEGATIVE NEGATIVE Final    Comment: (NOTE) SARS-CoV-2 target nucleic acids are NOT DETECTED.  The SARS-CoV-2 RNA is generally detectable in upper and lower respiratory specimens during the acute phase of infection. The lowest concentration of SARS-CoV-2 viral copies this  assay can detect is 250 copies / mL. A negative result does not preclude SARS-CoV-2 infection and should not be used as the sole basis for treatment or other patient management decisions.  A negative result may occur with improper specimen collection / handling, submission of specimen other than nasopharyngeal swab, presence of viral mutation(s) within the areas targeted by this assay, and inadequate number of viral copies (<250 copies / mL). A negative result must be combined with clinical observations, patient history, and epidemiological information.  Fact Sheet for Patients:   StrictlyIdeas.no  Fact Sheet for Healthcare Providers: BankingDealers.co.za  This test is not yet approved or  cleared by the [place] FDA and has been authorized for detection and/or diagnosis of SARS-CoV-2 by FDA under an Emergency Use Authorization (EUA).  This EUA will remain in effect (meaning this test can be used) for the duration of the COVID-19 declaration under  Section 564(b)(1) of the Act, 21 U.S.C. section 360bbb-3(b)(1), unless the authorization is terminated or revoked sooner.  Performed at [facility], [place] [address], [place], [place] [address]      Radiological Exams on Admission: DG Chest 2 View  Result Date: 01/19/2020 CLINICAL DATA:  Shortness of breath, productive cough. Dialysis yesterday. EXAM: CHEST - 2 VIEW COMPARISON:  Chest x-rays dated 01/14/2020 and 01/13/2020. Chest CT angiogram dated 01/14/2020. FINDINGS: Stable cardiomegaly. Lungs are clear. No pleural effusion or pneumothorax is seen. IMPRESSION: 1. Lungs are clear. No evidence of pneumonia or pulmonary edema on today's exam. 2. Stable cardiomegaly. Electronically Signed   By: Franki Cabot M.D.   On: 01/19/2020 08:22   IR Paracentesis  Result Date: 01/17/2020 INDICATION: Patient with history end-stage renal disease, cirrhosis, hepatitis B/C, recurrent ascites  ;request made for therapeutic paracentesis EXAM: ULTRASOUND GUIDED THERAPEUTIC  PARACENTESIS MEDICATIONS: None COMPLICATIONS: None immediate. PROCEDURE: Informed written consent was obtained from the patient after a discussion of the risks, benefits and alternatives to treatment. A timeout was performed prior to the initiation of the procedure. Initial ultrasound scanning demonstrates a moderate amount of ascites within the left lower abdominal quadrant. The left lower abdomen was prepped and draped in the usual sterile fashion. 1% lidocaine was used for local anesthesia. Following this, a 6 Fr Safe-T-Centesis catheter was introduced. An ultrasound image was saved for documentation purposes. The paracentesis was performed. The catheter was removed and a dressing was applied. The patient tolerated the procedure well without immediate post procedural complication. FINDINGS: A total of approximately 3.2 liters of hazy,amber fluid was removed. IMPRESSION: Successful ultrasound-guided therapeutic paracentesis yielding 3.2 liters of peritoneal fluid. Read by: Lindaann Pascal Electronically Signed   By: Markus Daft M.D.   On: 01/17/2020 13:42    EKG: Independently reviewed.  Atrial fibrillation at 118 bpm with QTC initially 545.  Assessment/Plan Respiratory failure with hypoxia COPD exacerbation: Acute on chronic.  Patient presents with complaints of shortness of breath, cough, and wheezing.  Currently on 3 L nasal cannula oxygen during O2 saturations.  On physical exam patient wheezing.  Chest x-ray was otherwise noted to be clear.  Patient had been given 125 mg of Solu-Medrol, magnesium sulfate, and inhalers.  Suspect could have been provoked from patient continuing to smoke. -Admit to a progressive bed -Continuous pulse oximetry with nasal cannula oxygen as needed -DuoNebs 4 times daily -Solu-Medrol 60 mg every 8 hours -Prophylactic antibiotics of Ceftin -Mucinex  Paroxysmal atrial fibrillation: Acute on  chronic.  Patient was temporarily noted to be in atrial fibrillation with heart rate elevated up to 140s.  Spontaneously converted back into sinus rhythm after using the restroom, but went back into atrial fibrillation.  Not on anticoagulation due to cirrhosis of the liver and issues with compliance on warfarin per notes from his cardiologist Dr. Einar Gip on 12/03/2019. -Continue patient's p.o. metoprolol -Metoprolol 2.5 mg IV as needed  Leukocytosis: Acute on chronic.  WBC elevated at 12.4.  Patient without any acute signs of infection.  Blood cultures from 5 days ago have shown no growth. -Continue to monitor  Hyperkalemia: Acute. Initial potassium elevated at 6.5.  Patient had been given 45 g of Kayexalate in the ED. -Recheck potassium levels  Cirrhosis of the liver with ascites: Acute on chronic.  Prior history of cirrhosis secondary to a prior history of alcohol abuse along with hepatitis B and C.  Patient just recently had paracentesis performed on 7/8.  On physical exam patient with  distended abdomen. -Orders placed for IR paracentesis in a.m. -N.p.o. after midnight  Abdominal pain: Acute on chronic.  Patient reports continued severe abdominal pain that is unchanged from previous.  Records note previous medications include oxycodone 10 mg as needed for pain. -Continue home regimen  Essential hypertension: Uncontrolled initial blood pressure is elevated up to 157/135.  Home blood pressure medications include metoprolol 100 mg twice daily and hydralazine 100 mg 2 times daily. -Continue home regimen    Prolonged QT interval: Acute on chronic.  Initially noted to be 545.  Patient appears to have a low history of prolonged QT, but would not allow repeat EKG while in the emergency department after going back to sinus rhythm. -Continue to monitor on telemetry -Attempting recheck EKG in a.m.  ESRD on HD: On admission patient was noted to have potassium of 6.5, but had recently just been dialyzed  yesterday.  On physical exam patient does not appear to be grossly fluid overloaded. -Continue current home meds -HD per nephrology -Appreciate nephrology consultative services  Anemia of chronic disease: Hemoglobin 11.4 g/dL which appears about his baseline.  Denied any reports of bleeding -Continue to monitor  GERD: Patient complains of having heartburn.  Home medications include Protonix 40 mg daily.  However, QT was initially noted to be prolonged and patient would not allow repeat EKG to be performed. -Hold Protonix -GI cocktail as needed for heartburn/indigestion  Tobacco use: Patient admits to smoking less than half pack cigarettes per day on average.  Decline need of nicotine patch. -Counseled on need of continued cessation of tobacco use   DVT prophylaxis: heparin  Code Status: Full Family Communication: No family requested to be updated at this time Disposition Plan: Possible discharge home in 2 to 3 days Consults called: Nephrology  Admission status: inpatient  Norval Morton MD [facility] Pager [phone]   If 7PM-7AM, please contact night-coverage [email or web address] Password [facility]  01/19/2020, 11:27 AM

## 2020-01-19 NOTE — ED Notes (Signed)
Lunch Tray Ordered @ 1039. 

## 2020-01-20 ENCOUNTER — Encounter (HOSPITAL_COMMUNITY): Payer: Self-pay | Admitting: Internal Medicine

## 2020-01-20 ENCOUNTER — Emergency Department (HOSPITAL_COMMUNITY): Payer: Medicare Other

## 2020-01-20 ENCOUNTER — Encounter (HOSPITAL_COMMUNITY): Payer: Self-pay | Admitting: Emergency Medicine

## 2020-01-20 ENCOUNTER — Other Ambulatory Visit: Payer: Self-pay

## 2020-01-20 ENCOUNTER — Emergency Department (HOSPITAL_COMMUNITY)
Admission: EM | Admit: 2020-01-20 | Discharge: 2020-01-20 | Disposition: A | Discharge status: Home / Self Care | Payer: Medicare Other | Attending: Emergency Medicine | Admitting: Emergency Medicine

## 2020-01-20 DIAGNOSIS — R079 Chest pain, unspecified: Secondary | ICD-10-CM | POA: Insufficient documentation

## 2020-01-20 DIAGNOSIS — Z5321 Procedure and treatment not carried out due to patient leaving prior to being seen by health care provider: Secondary | ICD-10-CM | POA: Insufficient documentation

## 2020-01-20 LAB — CBC
HCT: 33.6 % — ABNORMAL LOW (ref 39.0–52.0)
HCT: 34 % — ABNORMAL LOW (ref 39.0–52.0)
Hemoglobin: 11.5 g/dL — ABNORMAL LOW (ref 13.0–17.0)
Hemoglobin: 11.6 g/dL — ABNORMAL LOW (ref 13.0–17.0)
MCH: 27.4 pg (ref 26.0–34.0)
MCH: 27.5 pg (ref 26.0–34.0)
MCHC: 34.1 g/dL (ref 30.0–36.0)
MCHC: 34.2 g/dL (ref 30.0–36.0)
MCV: 80.4 fL (ref 80.0–100.0)
MCV: 80.4 fL (ref 80.0–100.0)
Platelets: 192 10*3/uL (ref 150–400)
Platelets: 210 10*3/uL (ref 150–400)
RBC: 4.18 MIL/uL — ABNORMAL LOW (ref 4.22–5.81)
RBC: 4.23 MIL/uL (ref 4.22–5.81)
RDW: 15.6 % — ABNORMAL HIGH (ref 11.5–15.5)
RDW: 15.7 % — ABNORMAL HIGH (ref 11.5–15.5)
WBC: 11.4 10*3/uL — ABNORMAL HIGH (ref 4.0–10.5)
WBC: 15.6 10*3/uL — ABNORMAL HIGH (ref 4.0–10.5)
nRBC: 0 % (ref 0.0–0.2)
nRBC: 0.1 % (ref 0.0–0.2)

## 2020-01-20 LAB — RENAL FUNCTION PANEL
Albumin: 2.7 g/dL — ABNORMAL LOW (ref 3.5–5.0)
Anion gap: 13 (ref 5–15)
BUN: 15 mg/dL (ref 6–20)
CO2: 28 mmol/L (ref 22–32)
Calcium: 9.2 mg/dL (ref 8.9–10.3)
Chloride: 93 mmol/L — ABNORMAL LOW (ref 98–111)
Creatinine, Ser: 2.61 mg/dL — ABNORMAL HIGH (ref 0.61–1.24)
GFR calc Af Amer: 30 mL/min — ABNORMAL LOW (ref >60)
GFR calc non Af Amer: 26 mL/min — ABNORMAL LOW (ref >60)
Glucose, Bld: 170 mg/dL — ABNORMAL HIGH (ref 70–99)
Phosphorus: 4.1 mg/dL (ref 2.5–4.6)
Potassium: 3.8 mmol/L (ref 3.5–5.1)
Sodium: 134 mmol/L — ABNORMAL LOW (ref 135–145)

## 2020-01-20 LAB — BASIC METABOLIC PANEL
Anion gap: 16 — ABNORMAL HIGH (ref 5–15)
BUN: 36 mg/dL — ABNORMAL HIGH (ref 6–20)
CO2: 27 mmol/L (ref 22–32)
Calcium: 9.5 mg/dL (ref 8.9–10.3)
Chloride: 89 mmol/L — ABNORMAL LOW (ref 98–111)
Creatinine, Ser: 4.36 mg/dL — ABNORMAL HIGH (ref 0.61–1.24)
GFR calc Af Amer: 16 mL/min — ABNORMAL LOW (ref >60)
GFR calc non Af Amer: 14 mL/min — ABNORMAL LOW (ref >60)
Glucose, Bld: 171 mg/dL — ABNORMAL HIGH (ref 70–99)
Potassium: 4.5 mmol/L (ref 3.5–5.1)
Sodium: 132 mmol/L — ABNORMAL LOW (ref 135–145)

## 2020-01-20 LAB — TROPONIN I (HIGH SENSITIVITY): Troponin I (High Sensitivity): 49 ng/L — ABNORMAL HIGH (ref <18)

## 2020-01-20 LAB — MAGNESIUM: Magnesium: 2.4 mg/dL (ref 1.7–2.4)

## 2020-01-20 MED ORDER — CHLORHEXIDINE GLUCONATE CLOTH 2 % EX PADS
6.0000 | MEDICATED_PAD | Freq: Every day | CUTANEOUS | Status: DC
  Administered 2020-01-20: 6 via TOPICAL

## 2020-01-20 MED ORDER — HYDROMORPHONE HCL 2 MG PO TABS
1.0000 mg | ORAL_TABLET | Freq: Once | ORAL | Status: AC
  Administered 2020-01-20: 1 mg via ORAL
  Filled 2020-01-20: qty 1

## 2020-01-20 MED ORDER — GUAIFENESIN-DM 100-10 MG/5ML PO SYRP
5.0000 mL | ORAL_SOLUTION | ORAL | Status: DC | PRN
  Administered 2020-01-20: 5 mL via ORAL
  Filled 2020-01-20: qty 5

## 2020-01-20 MED ORDER — SODIUM CHLORIDE 0.9% FLUSH: 3.0000 mL | Freq: Once | INTRAVENOUS | Status: DC

## 2020-01-20 NOTE — ED Triage Notes (Signed)
Pt c/o chest pain that started today, reports he checked his BP and HR at home, and his HR read "low".

## 2020-01-20 NOTE — ED Notes (Signed)
Called pt name for VS recheck, no response from pt

## 2020-01-20 NOTE — Progress Notes (Signed)
Patient asking for food and dilaudid by IV throughout the night.  Explained to patient he is NPO for paracentesis schedule today.  Patient kept NPO until he can speak to the day shift MD Dr. Nevada Crane.  He isn't understanding if he goes to [facility] or [facility] he would still be sent back to [facility] for IR procedure.  He has requested that the MD be called in now and informed they would be in this morning between 0700 and 1100.  Patient has distended abdomen.  Patient non compliant with medications, telemetry monitoring and safety issues of using shoes or our non grip socks.  Patient already has stated he will go a hospital in [place] to get what he wants.  He eventually took medications and wore telemetry after convincing and explain the importance of them.

## 2020-01-20 NOTE — Plan of Care (Signed)
Discussed why the patient was NPO d/t procedure this am and was given a lean cuisine meal and 4 sherbets.  Patient had to be convinced of the importance of taking his blood pressure medications tonight.

## 2020-01-20 NOTE — Progress Notes (Signed)
[facility] Progress Note   Subjective:   Patient seen and examined at bedside.  Breathing better.  Hungry and ready to go home, would like to make it to church.  Does not want a paracentesis today, says he has another one scheduled for Thursday already.  Denies SOB and CP.  Objective Vitals:   01/20/20 0301 01/20/20 0345 01/20/20 0400 01/20/20 0500  BP:  102/85    Pulse: 74 73 87 94  Resp:  18    Temp:  98.7 F (37.1 C)    TempSrc:  Oral    SpO2: (!) 89% 96% 100% 92%  Weight:      Height:       Physical Exam General:Chronically ill appearing male, sitting in bedside chair in NAD Heart:IRIR Lungs: mostly CTAB Abdomen:soft, +distened, non tender Extremities:no LE edema Dialysis Access: RU AVG   [facility] Weights   01/19/20 0714  Weight: 61.2 kg    Intake/Output Summary (Last 24 hours) at 01/20/2020 0950 Last data filed at 01/20/2020 0819 Gross per 24 hour  Intake 343 ml  Output --  Net 343 ml    Additional Objective Labs: Basic Metabolic Panel: Recent Labs  Lab 01/14/20 0023 01/14/20 1800 01/19/20 0838 01/19/20 1728 01/20/20 0044  NA 132*   < > 131* 130* 134*  K 4.2   < > 6.5* 7.3* 3.8  CL 95*   < > 90* 87* 93*  CO2 24   < > 28 27 28   GLUCOSE 124*   < > 97 182* 170*  BUN 15   < > 31* 38* 15  CREATININE 3.15*   < > 4.38* 4.99* 2.61*  CALCIUM 9.0   < > 9.3 9.6 9.2  PHOS 3.9  --   --   --  4.1   < > = values in this interval not displayed.   Liver Function Tests: Recent Labs  Lab 01/14/20 1926 01/14/20 1926 01/15/20 0838 01/19/20 0838 01/20/20 0044  AST 46*  --  39 108*  --   ALT 37  --  33 81*  --   ALKPHOS 337*  --  308* 421*  --   BILITOT 0.7  --  0.8 1.0  --   PROT 7.0  --  6.9 6.8  --   ALBUMIN 2.6*   < > 2.5* 2.5* 2.7*   < > = values in this interval not displayed.   Recent Labs  Lab 01/13/20 1559  LIPASE 27   CBC: Recent Labs  Lab 01/14/20 0023 01/14/20 0023 01/14/20 1800 01/14/20 1800 01/15/20 0838 01/19/20 0838  01/20/20 0044  WBC 12.9*   < > 11.0*   < > 11.9* 12.7* 11.4*  NEUTROABS  --   --  9.0*  --  11.2* 10.3*  --   HGB 11.0*   < > 10.5*   < > 11.4* 11.4* 11.5*  HCT 31.6*   < > 31.0*   < > 33.6* 33.3* 33.6*  MCV 79.4*  --  81.2  --  82.4 80.0 80.4  PLT 196   < > 215   < > 207 178 192   < > = values in this interval not displayed.   CBG: Recent Labs  Lab 01/13/20 1545 01/13/20 1851 01/14/20 0023 01/14/20 0730  GLUCAP 139* 64* 122* 120*    Lab Results  Component Value Date   INR 1.2 01/13/2020   INR 1.5 (H) 10/27/2019   INR 1.2 10/20/2019   Studies/Results: DG Chest 2  View  Result Date: 01/19/2020 CLINICAL DATA:  Shortness of breath, productive cough. Dialysis yesterday. EXAM: CHEST - 2 VIEW COMPARISON:  Chest x-rays dated 01/14/2020 and 01/13/2020. Chest CT angiogram dated 01/14/2020. FINDINGS: Stable cardiomegaly. Lungs are clear. No pleural effusion or pneumothorax is seen. IMPRESSION: 1. Lungs are clear. No evidence of pneumonia or pulmonary edema on today's exam. 2. Stable cardiomegaly. Electronically Signed   By: Franki Cabot M.D.   On: 01/19/2020 08:22    Medications:  . alum & mag hydroxide-simeth  30 mL Oral Once  . cefdinir  300 mg Oral Daily  . Chlorhexidine Gluconate Cloth  6 each Topical Q0600  . gabapentin  100 mg Oral TID  . guaiFENesin  600 mg Oral BID  . heparin  5,000 Units Subcutaneous Q8H  . hydrALAZINE  100 mg Oral BID  . methylPREDNISolone (SOLU-MEDROL) injection  60 mg Intravenous Q8H  . metoprolol tartrate  100 mg Oral BID  . sevelamer carbonate  3,200 mg Oral TID WC  . sodium chloride flush  3 mL Intravenous Q12H    Dialysis Orders: [facility],4h edw 61kg2/2 bath 450/800 R arm AVG Hep none mircera 93mcg q2wks - last 01/16/20 Calcitriol 65mcg qHD Venofer 50mg  qwk  Assessment/Plan: 1. Dyspnea - Improved with HD.    2. Hyperkalemia - Resolved with HD. K 3.8 today. 2. ESRD - on HD MWF.  Extra HD yesterday d/t hyperkalemia and increased  volume.   3. Anemia of CKD- Hgb 11.4. Recently ESA dose.  4. Secondary hyperparathyroidism - CCa and phos at goal.  Resume VDRA.  Continue binders.    5. HTN/volume - BP and volume improved.  Continue home meds. 6. Nutrition - Renal diet w/fluid restrictions 7. Hep B + 8. Prolonged QT 9. GERD 10. Cirrhosis w/recurrent ascites - Refusing paracentesis today.  Has one scheduled for 7/15. 11. A fib 12. Dispo - ok for d/c from renal standpoint  [name], PA-C [facility] Pager: [phone] 01/20/2020,9:50 AM  LOS: 1 day

## 2020-01-20 NOTE — Progress Notes (Signed)
Patient requesting to be discharged and wants to eat. He took his heart monitor off. I have sent Dr. Nevada Crane a secure message to inform her of this.

## 2020-01-20 NOTE — Discharge Summary (Signed)
Discharge Summary  [name] OVF:[id number] DOB: 12/01/1960  PCP: [name], FNP  Admit date: 01/19/2020 Discharge date: 01/20/2020  Time spent: 25 minutes  Recommendations for Outpatient Follow-up:  1. Patient left against medical advice.  Discharge Diagnoses:  Active Hospital Problems   Diagnosis Date Noted  . COPD exacerbation ([place]) 08/17/2016  . Leukocytosis 01/19/2020  . Tobacco use 01/19/2020  . AF (paroxysmal atrial fibrillation) ([place]) 01/23/2018  . QT prolongation 09/29/2017  . ESRD on dialysis ([place]) 07/04/2017  . Anemia due to chronic kidney disease 06/18/2017  . Acute on chronic respiratory failure with hypoxia ([place]) 03/25/2017  . Essential hypertension 09/27/2016  . Nicotine dependence, cigarettes, uncomplicated 84/16/6063    Resolved Hospital Problems   Diagnosis Date Noted Date Resolved  . Respiratory failure ([place]) 08/17/2016 01/19/2020    Vitals:   01/20/20 0400 01/20/20 0500  BP:    Pulse: 87 94  Resp:    Temp:    SpO2: 100% 92%    History of present illness:  HPI: [name] is a 59 y.o. male with medical history significant of hypertension ESRD on HD(MWF), CAD(last cath 2018 S/P DES to RCA), PAF, cirrhosis, hep B & C, EtOH abuse, COPD, and tobacco abuse presents with complaints of cough and shortness of breath.  He complains of having a productive cough with clear sputum production since yesterday.  He complains of associated symptoms of severe abdominal pain, abdominal distention, and heartburn.  Patient reports that he completed his full hemodialysis treatment yesterday and received his first COVID-19 vaccine.  He admits to continued smoking less than 1 pack cigarettes per day on average, but declines need of nicotine patch.  ED Course: Upon admission into the emergency department patient was seen to be afebrile in atrial fibrillation with heart rates up to 144, respirations 22, blood pressures 122/91-157/135, and O2  saturation currently maintained on 3 L of nasal cannula oxygen.  Labs significant for WBC 12.7, hemoglobin 11.4, sodium 131, potassium 6.5, BUN 31, and creatinine 4.38.  Chest x-ray showed stable cardiomegaly without signs of edema or infiltrate.  Nephrology was formally consulted, patient was given 125 mg of Solu-Medrol IV, 45 g of Kayexalate, albuterol, and ipratropium.  Plan was for patient to be started on a diltiazem drip for rate control, but he converted back into sinus rhythm after using the restroom.  Patient would not allow repeat EKG to be performed.  TRH called to admit.  01/20/20: Seen and examined.  Was hemodialized overnight.  Was hungry and wanted to go home.  Declined paracentesis or any other inpatient treatments.  His diet was restarted.  Left without being discharged.  Stated that he will have paracentesis on Thursday.  Advised to keep his HD appointments.  Hospital Course:  Principal Problem:   COPD exacerbation ([place]) Active Problems:   Nicotine dependence, cigarettes, uncomplicated   Essential hypertension   Acute on chronic respiratory failure with hypoxia (H[facility])   Anemia due to chronic kidney disease   ESRD on dialysis ([facility])   QT prolongation   AF (paroxysmal atrial fibrillation) ([facility])   Leukocytosis   Tobacco use    Procedures:  HD night of 01/19/20  Consultations:  Nephrology  Discharge Exam: BP 102/85   Pulse 94   Temp 98.7 F (37.1 C) (Oral)   Resp 18   Ht 5\' 9"  (1.753 m)   Wt 61.2 kg   SpO2 92%   BMI 19.92 kg/m  . General: 59 y.o. year-old male well  developed well nourished in no acute distress.  Alert and oriented x3. . Cardiovascular: Regular rate and rhythm with no rubs or gallops.  Marland Kitchen Respiratory: Rales and wheezing noted bilaterally.  Good inspiratory effort. . Abdomen: Distended. Non tender. Bowel sounds present. . Musculoskeletal: No lower extremity edema. 2/4 pulses in all 4 extremities. Marland Kitchen Psychiatry: Mood is appropriate for condition and  setting  Discharge Instructions You were cared for by a hospitalist during your hospital stay. If you have any questions about your discharge medications or the care you received while you were in the hospital after you are discharged, you can call the unit and asked to speak with the hospitalist on call if the hospitalist that took care of you is not available. Once you are discharged, your primary care physician will handle any further medical issues. Please note that NO REFILLS for any discharge medications will be authorized once you are discharged, as it is imperative that you return to your primary care physician (or establish a relationship with a primary care physician if you do not have one) for your aftercare needs so that they can reassess your need for medications and monitor your lab values.   Allergies as of 01/20/2020      Reactions   Tramadol Itching, Other (See Comments)   Morphine And Related Other (See Comments)   Stomach pain   Pollen Extract    Other reaction(s): Sneezing (finding)   Acetaminophen Nausea Only   Stomach ache Other reaction(s): Other (See Comments) Stomach ache   Aspirin Other (See Comments), Itching   STOMACH PAIN Other reaction(s): Unknown STOMACH PAIN   Clonidine Derivatives Itching      Medication List    STOP taking these medications   albuterol 108 (90 Base) MCG/ACT inhaler Commonly known as: VENTOLIN HFA   cefdinir 300 MG capsule Commonly known as: OMNICEF   gabapentin 100 MG capsule Commonly known as: Neurontin   hydrALAZINE 100 MG tablet Commonly known as: APRESOLINE   hydrOXYzine 25 MG tablet Commonly known as: ATARAX/VISTARIL   lactulose 10 GM/15ML solution Commonly known as: CHRONULAC   metoprolol tartrate 100 MG tablet Commonly known as: LOPRESSOR   montelukast 10 MG tablet Commonly known as: SINGULAIR   ondansetron 4 MG tablet Commonly known as: ZOFRAN   Oxycodone HCl 10 MG Tabs   pantoprazole 40 MG  tablet Commonly known as: PROTONIX   sevelamer carbonate 800 MG tablet Commonly known as: RENVELA   sodium polystyrene 15 GM/60ML suspension Commonly known as: KAYEXALATE   sulfamethoxazole-trimethoprim 800-160 MG tablet Commonly known as: BACTRIM DS      Allergies  Allergen Reactions  . Tramadol Itching and Other (See Comments)  . Morphine And Related Other (See Comments)    Stomach pain  . Pollen Extract     Other reaction(s): Sneezing (finding)  . Acetaminophen Nausea Only    Stomach ache Other reaction(s): Other (See Comments) Stomach ache  . Aspirin Other (See Comments) and Itching    STOMACH PAIN Other reaction(s): Unknown STOMACH PAIN  . Clonidine Derivatives Itching      The results of significant diagnostics from this hospitalization (including imaging, microbiology, ancillary and laboratory) are listed below for reference.    Significant Diagnostic Studies: DG Chest 2 View  Result Date: 01/19/2020 CLINICAL DATA:  Shortness of breath, productive cough. Dialysis yesterday. EXAM: CHEST - 2 VIEW COMPARISON:  Chest x-rays dated 01/14/2020 and 01/13/2020. Chest CT angiogram dated 01/14/2020. FINDINGS: Stable cardiomegaly. Lungs are clear. No pleural effusion or pneumothorax  is seen. IMPRESSION: 1. Lungs are clear. No evidence of pneumonia or pulmonary edema on today's exam. 2. Stable cardiomegaly. Electronically Signed   By: Franki Cabot M.D.   On: 01/19/2020 08:22   CT Angio Chest PE W and/or Wo Contrast  Result Date: 01/14/2020 CLINICAL DATA:  Shortness of breath. Soft dialysis prematurely secondary to itching. Recent hospital discharge. EXAM: CT ANGIOGRAPHY CHEST WITH CONTRAST TECHNIQUE: Multidetector CT imaging of the chest was performed using the standard protocol during bolus administration of intravenous contrast. Multiplanar CT image reconstructions and MIPs were obtained to evaluate the vascular anatomy. CONTRAST:  43mL OMNIPAQUE IOHEXOL 350 MG/ML SOLN  COMPARISON:  CT chest 12/23/2019 FINDINGS: Cardiovascular: Satisfactory opacification the pulmonary arteries to the segmental level. No pulmonary artery filling defects are identified. Central pulmonary arteries are normal caliber. Suboptimal opacification of the aorta for luminal evaluation. Atherosclerotic plaque within the normal caliber aorta. Normal 3 vessel branching of the aortic arch. Extensive atheromatous plaque throughout the proximal great vessels as included. Mild cardiomegaly with predominantly biatrial enlargement. Dense calcifications noted upon the coronary arteries as well as upon the mitral annulus and aortic leaflets. Small pericardial effusion. Distension of the azygos. Reflux of contrast into the IVC and hepatic veins. No other major venous abnormalities on this arterial phase imaging. Mediastinum/Nodes: Edematous changes the mediastinum. Normal thyroid gland and thoracic inlet. No acute abnormality of the trachea or esophagus. No worrisome mediastinal, hilar or axillary adenopathy. Lungs/Pleura: Small bilateral pleural effusions with adjacent areas of passive atelectasis. Septal and fissural thickening is present with vascular redistribution. Some dependent areas of ground-glass opacity favoring a combination of passive and dependent atelectasis. No worrisome consolidative opacity. No pneumothorax. Stable biapical pleuroparenchymal scarring. No worrisome nodules or masses. Upper Abdomen: Upper abdominal ascites. No other acute upper abdominal abnormality is visible. Musculoskeletal: Diffuse body wall edema. Osseous manifestations of renal osteodystrophy. No worrisome bone or chest wall lesions. Review of the MIP images confirms the above findings. IMPRESSION: 1. No evidence of acute pulmonary artery filling defects to suggest pulmonary embolism. 2. Cardiomegaly with predominantly biatrial enlargement. Small pericardial effusion. 3. Reflux of contrast into the IVC and hepatic veins, can be seen  with right heart failure/fluid overload. 4. Small bilateral pleural effusions with adjacent areas of passive atelectasis. 5. Fissural and septal thickening with vascular redistribution likely to reflect some interstitial edema. 6. Diffuse body wall edema, upper abdominal ascites, and body wall edema, consistent with anasarca. 7. Osseous manifestations of renal osteodystrophy. 8. Aortic Atherosclerosis (ICD10-I70.0). Electronically Signed   By: Lovena Le M.D.   On: 01/14/2020 21:00   CT ANGIO CHEST PE W OR WO CONTRAST  Result Date: 12/23/2019 CLINICAL DATA:  Shortness of breath EXAM: CT ANGIOGRAPHY CHEST WITH CONTRAST TECHNIQUE: Multidetector CT imaging of the chest was performed using the standard protocol during bolus administration of intravenous contrast. Multiplanar CT image reconstructions and MIPs were obtained to evaluate the vascular anatomy. CONTRAST:  11mL OMNIPAQUE IOHEXOL 350 MG/ML SOLN COMPARISON:  None. FINDINGS: Cardiovascular: Satisfactory opacification of the pulmonary arteries to the segmental level. No evidence of pulmonary embolism. Gross cardiomegaly. Three-vessel coronary artery calcifications and/or stents. Small pericardial effusion. Aortic atherosclerosis. Mediastinum/Nodes: No enlarged mediastinal, hilar, or axillary lymph nodes. Thyroid gland, trachea, and esophagus demonstrate no significant findings. Lungs/Pleura: Small bilateral pleural effusions and associated atelectasis or consolidation. Upper Abdomen: Ascites in the included upper abdomen. Musculoskeletal: No chest wall abnormality. No acute or significant osseous findings. Review of the MIP images confirms the above findings. IMPRESSION: 1. Negative examination  for pulmonary embolism. 2. Small bilateral pleural effusions and associated atelectasis or consolidation. 3. Gross cardiomegaly with small pericardial effusion. 4. Three-vessel coronary artery calcifications and/or stents. 5. Ascites in the included upper abdomen. 6.  Aortic Atherosclerosis (ICD10-I70.0). Electronically Signed   By: Eddie Candle M.D.   On: 12/23/2019 14:04   US Paracentesis  Result Date: 12/22/2019 INDICATION: Patient with history of cirrhosis with recurrent ascites presents for diagnostic and therapeutic paracentesis EXAM: ULTRASOUND GUIDED DIAGNOSTIC AND THERAPEUTIC PARACENTESIS MEDICATIONS: Lidocaine 1% 10 mL COMPLICATIONS: None immediate. PROCEDURE: Informed written consent was obtained from the patient after a discussion of the risks, benefits and alternatives to treatment. A timeout was performed prior to the initiation of the procedure. Initial ultrasound scanning demonstrates a moderate amount of ascites within the right lower abdominal quadrant. The right lower abdomen was prepped and draped in the usual sterile fashion. 1% lidocaine was used for local anesthesia. Following this, a 19 gauge, 7-cm, Yueh catheter was introduced. An ultrasound image was saved for documentation purposes. The paracentesis was performed. The catheter was removed and a dressing was applied. The patient tolerated the procedure well without immediate post procedural complication. FINDINGS: A total of approximately 4.6 L of serosanguineous fluid was removed. Samples were sent to the laboratory as requested by the clinical team. IMPRESSION: Successful ultrasound-guided diagnostic and therapeutic paracentesis yielding 4.6 liters of peritoneal fluid. Read by: Rushie Nyhan, NP Electronically Signed   By: Jerilynn Mages.  Shick M.D.   On: 12/22/2019 10:28   DG Chest Portable 1 View  Result Date: 01/14/2020 CLINICAL DATA:  Shortness of breath. EXAM: PORTABLE CHEST 1 VIEW COMPARISON:  January 13, 2020 FINDINGS: Mild, diffuse, chronic appearing increased lung markings are seen. There is decreased vascular congestion since the prior study. Mild, hazy areas of atelectasis and/or early infiltrate are seen along the periphery of the mid left lung. There is no evidence of a pleural effusion or  pneumothorax. The cardiac silhouette is markedly enlarged. There is mild calcification of the aortic arch. The visualized skeletal structures are unremarkable. IMPRESSION: 1. Mild, hazy areas of atelectasis and/or early infiltrate along the periphery of the mid left lung. 2. Decreased pulmonary vascular congestion when compared to the prior exam. Electronically Signed   By: Virgina Norfolk M.D.   On: 01/14/2020 18:29   DG Chest Portable 1 View  Result Date: 01/13/2020 CLINICAL DATA:  59 year old male with shortness of breath. EXAM: PORTABLE CHEST 1 VIEW COMPARISON:  Chest radiograph dated 12/21/2019 and CT dated 12/23/2019 FINDINGS: There is moderate enlargement of the cardiopericardial silhouette similar to the radiograph of 12/21/2019. There is central vascular prominence consistent with congestion. Trace bilateral pleural effusions noted. There are bibasilar atelectatic changes. No lobar consolidation or pneumothorax. Atherosclerotic calcification of the aorta and branches of the aortic arch. No acute osseous pathology. IMPRESSION: 1. Cardiomegaly with vascular congestion and possible mild edema. Overall slight interval worsening of the congestion since the radiograph of 12/21/2019. 2. Advanced atherosclerotic calcification of the aorta and branches of the aortic arch. Electronically Signed   By: Anner Crete M.D.   On: 01/13/2020 16:32   DG Chest Port 1 View  Result Date: 12/21/2019 CLINICAL DATA:  Shortness of breath.  End-stage renal disease EXAM: PORTABLE CHEST 1 VIEW COMPARISON:  December 18, 2019 FINDINGS: There is ill-defined opacity in portions of each mid and lower lung region, similar to recent study, which is felt to represent a degree of interstitial edema. No consolidation. There is cardiomegaly with a degree of pulmonary venous  hypertension. No adenopathy. There is aortic atherosclerosis. There is calcification in each carotid artery an each subclavian artery. No adenopathy. No bone lesion.  IMPRESSION: Apparent interstitial edema in the mid and lower lung regions, similar to recent study. There is cardiomegaly with a degree of pulmonary vascular congestion. Suspect congestive heart failure. No consolidation evident. Extensive atherosclerotic calcification at multiple sites as noted above. Aortic Atherosclerosis (ICD10-I70.0). Electronically Signed   By: Lowella Grip III M.D.   On: 12/21/2019 19:06   CT RENAL STONE STUDY  Result Date: 12/22/2019 CLINICAL DATA:  Flank pain EXAM: CT ABDOMEN AND PELVIS WITHOUT CONTRAST TECHNIQUE: Multidetector CT imaging of the abdomen and pelvis was performed following the standard protocol without IV contrast. COMPARISON:  05/07/2019 FINDINGS: Lower chest: There are small bilateral pleural effusions.The heart size is significantly enlarged. There is a small to moderate-sized pericardial effusion. Advanced coronary artery calcifications are noted. There is some atelectasis at the lung bases. Hepatobiliary: The liver is normal. Normal gallbladder.There is no biliary ductal dilation. Pancreas: Normal contours without ductal dilatation. No peripancreatic fluid collection. Spleen: Unremarkable. Adrenals/Urinary Tract: --Adrenal glands: Unremarkable. --Right kidney/ureter: The right kidney is atrophic. --Left kidney/ureter: Left kidney is atrophic. --Urinary bladder: Bladder is decompressed and therefore poorly evaluated. Stomach/Bowel: --Stomach/Duodenum: No hiatal hernia or other gastric abnormality. Normal duodenal course and caliber. --Small bowel: Unremarkable. --Colon: Unremarkable. --Appendix: Normal. Vascular/Lymphatic: Atherosclerotic calcification is present within the non-aneurysmal abdominal aorta, without hemodynamically significant stenosis. --No retroperitoneal lymphadenopathy. --No mesenteric lymphadenopathy. --No pelvic or inguinal lymphadenopathy. Reproductive: Unremarkable Other: There is a large volume of abdominal ascites. There is body wall  edema. Musculoskeletal. Renal osteodystrophy is noted. IMPRESSION: 1. Large volume of abdominal ascites. 2. Small bilateral pleural effusions and small to moderate-sized pericardial effusion. 3. Cardiomegaly with advanced coronary artery calcifications. 4. Atrophic kidneys. Aortic Atherosclerosis (ICD10-I70.0). Electronically Signed   By: Constance Holster M.D.   On: 12/22/2019 00:29   [place] ASCITES (ABDOMEN LIMITED)  Result Date: 01/14/2020 CLINICAL DATA:  Assess ascites. Multiple previous paracenteses due to cirrhosis. EXAM: LIMITED ABDOMEN ULTRASOUND FOR ASCITES TECHNIQUE: Limited ultrasound survey for ascites was performed in all four abdominal quadrants. COMPARISON:  12/22/2019 FINDINGS: Examination demonstrates mild-to-moderate amount of ascites left greater than right. Ascites over the right abdomen is improved compared to the images from the previous exam. IMPRESSION: Mild-to-moderate ascites left greater than right. Electronically Signed   By: Marin Olp M.D.   On: 01/14/2020 21:52   IR Paracentesis  Result Date: 01/17/2020 INDICATION: Patient with history end-stage renal disease, cirrhosis, hepatitis B/C, recurrent ascites ;request made for therapeutic paracentesis EXAM: ULTRASOUND GUIDED THERAPEUTIC  PARACENTESIS MEDICATIONS: None COMPLICATIONS: None immediate. PROCEDURE: Informed written consent was obtained from the patient after a discussion of the risks, benefits and alternatives to treatment. A timeout was performed prior to the initiation of the procedure. Initial ultrasound scanning demonstrates a moderate amount of ascites within the left lower abdominal quadrant. The left lower abdomen was prepped and draped in the usual sterile fashion. 1% lidocaine was used for local anesthesia. Following this, a 6 Fr Safe-T-Centesis catheter was introduced. An ultrasound image was saved for documentation purposes. The paracentesis was performed. The catheter was removed and a dressing was applied. The  patient tolerated the procedure well without immediate post procedural complication. FINDINGS: A total of approximately 3.2 liters of hazy,amber fluid was removed. IMPRESSION: Successful ultrasound-guided therapeutic paracentesis yielding 3.2 liters of peritoneal fluid. Read by: Lindaann Pascal Electronically Signed   By: Markus Daft M.D.   On: 01/17/2020  13:42   IR Paracentesis  Result Date: 01/10/2020 INDICATION: Patient with a history of cirrhosis and recurrent ascites. Interventional radiology asked to perform a therapeutic paracentesis. EXAM: ULTRASOUND GUIDED PARACENTESIS MEDICATIONS: 1% lidocaine 20 mL COMPLICATIONS: None immediate. PROCEDURE: Informed written consent was obtained from the patient after a discussion of the risks, benefits and alternatives to treatment. A timeout was performed prior to the initiation of the procedure. Initial ultrasound scanning demonstrates a large amount of ascites within the right lower abdominal quadrant. The right lower abdomen was prepped and draped in the usual sterile fashion. 1% lidocaine was used for local anesthesia. Following this, a 19 gauge, 7-cm, Yueh catheter was introduced. A strong fluid return was obtained but when attached to suction the flow stopped. Multiple attempts at manipulating the catheter were made without success. Ultrasound imaging continued to demonstrate a large amount of ascites in the right lower quadrant. The catheter was removed and a second 19 gauge, 7-cm Yueh was inserted with the same results. The catheter was removed and a dressing was applied. Ultrasound scanning demonstrated a large amount of ascites within the left lower abdominal quadrant. The left lower abdomen was prepped and draped in the usual sterile fashion. 1% lidocaine was used for local anesthesia. A 19 gauge, 7-cm Teressa Lower was inserted with a strong fluid return but when attached to suction the flow stopped. Multiple attempts at manipulating the catheter were made without  success. The catheter was removed. A 6 French Safe-T-Centesis was then inserted with expected results. An ultrasound image was saved for documentation purposes. The paracentesis was performed. The catheter was removed and a dressing was applied. The patient tolerated the procedure well without immediate post procedural complication. FINDINGS: A total of approximately 3 L of light pink fluid was removed. IMPRESSION: Successful ultrasound-guided paracentesis yielding 3 liters of peritoneal fluid. Read by: Soyla Dryer, NP Electronically Signed   By: Markus Daft M.D.   On: 01/10/2020 17:15   IR Paracentesis  Result Date: 01/03/2020 INDICATION: Cirrhosis with recurrent ascites. Request for diagnostic and therapeutic paracentesis. EXAM: ULTRASOUND GUIDED PARACENTESIS MEDICATIONS: 1% lidocaine 15 mL COMPLICATIONS: None immediate. PROCEDURE: Informed written consent was obtained from the patient after a discussion of the risks, benefits and alternatives to treatment. A timeout was performed prior to the initiation of the procedure. Initial ultrasound scanning demonstrates a moderate amount of ascites within the left lower abdominal quadrant. The left lower abdomen was prepped and draped in the usual sterile fashion. 1% lidocaine was used for local anesthesia. Following this, a 19 gauge, 7-cm, Yueh catheter was introduced. An ultrasound image was saved for documentation purposes. The paracentesis was performed. The catheter was removed and a dressing was applied. The patient tolerated the procedure well without immediate post procedural complication. FINDINGS: A total of approximately 2.9 L of red fluid was removed. Samples were sent to the laboratory as requested by the clinical team. IMPRESSION: Successful ultrasound-guided paracentesis yielding 2.9 liters of peritoneal fluid. Read by: Gareth Eagle, PA-C Electronically Signed   By: Markus Daft M.D.   On: 01/03/2020 14:33    Microbiology: Recent Results (from the  past 240 hour(s))  SARS Coronavirus 2 by RT PCR (hospital order, performed in [facility] hospital lab) Nasopharyngeal Nasopharyngeal Swab     Status: None   Collection Time: 01/13/20  4:08 PM   Specimen: Nasopharyngeal Swab  Result Value Ref Range Status   SARS Coronavirus 2 NEGATIVE NEGATIVE Final    Comment: (NOTE) SARS-CoV-2 target nucleic acids are NOT DETECTED.  The SARS-CoV-2 RNA is generally detectable in upper and lower respiratory specimens during the acute phase of infection. The lowest concentration of SARS-CoV-2 viral copies this assay can detect is 250 copies / mL. A negative result does not preclude SARS-CoV-2 infection and should not be used as the sole basis for treatment or other patient management decisions.  A negative result may occur with improper specimen collection / handling, submission of specimen other than nasopharyngeal swab, presence of viral mutation(s) within the areas targeted by this assay, and inadequate number of viral copies (<250 copies / mL). A negative result must be combined with clinical observations, patient history, and epidemiological information.  Fact Sheet for Patients:   StrictlyIdeas.no  Fact Sheet for Healthcare Providers: BankingDealers.co.za  This test is not yet approved or  cleared by the [place] FDA and has been authorized for detection and/or diagnosis of SARS-CoV-2 by FDA under an Emergency Use Authorization (EUA).  This EUA will remain in effect (meaning this test can be used) for the duration of the COVID-19 declaration under Section 564(b)(1) of the Act, 21 U.S.C. section 360bbb-3(b)(1), unless the authorization is terminated or revoked sooner.  Performed at [facility], [place] [address], [place], [place] [address]   Culture, blood (routine x 2)     Status: None   Collection Time: 01/13/20  5:29 PM   Specimen: BLOOD  Result Value Ref Range Status   Specimen  Description BLOOD BLOOD LEFT WRIST  Final   Special Requests   Final    BOTTLES DRAWN AEROBIC AND ANAEROBIC Blood Culture adequate volume   Culture   Final    NO GROWTH 5 DAYS Performed at [facility], [place] [address], [place], [place] [address]    Report Status 01/18/2020 FINAL  Final  Culture, blood (routine x 2)     Status: None   Collection Time: 01/14/20 12:23 AM   Specimen: BLOOD LEFT HAND  Result Value Ref Range Status   Specimen Description BLOOD LEFT HAND  Final   Special Requests   Final    BOTTLES DRAWN AEROBIC ONLY Blood Culture adequate volume   Culture   Final    NO GROWTH 5 DAYS Performed at [facility], [place] [address], [place], [place] [address]    Report Status 01/19/2020 FINAL  Final  SARS Coronavirus 2 by RT PCR (hospital order, performed in [facility] hospital lab) Nasopharyngeal Nasopharyngeal Swab     Status: None   Collection Time: 01/14/20  9:37 PM   Specimen: Nasopharyngeal Swab  Result Value Ref Range Status   SARS Coronavirus 2 NEGATIVE NEGATIVE Final    Comment: (NOTE) SARS-CoV-2 target nucleic acids are NOT DETECTED.  The SARS-CoV-2 RNA is generally detectable in upper and lower respiratory specimens during the acute phase of infection. The lowest concentration of SARS-CoV-2 viral copies this assay can detect is 250 copies / mL. A negative result does not preclude SARS-CoV-2 infection and should not be used as the sole basis for treatment or other patient management decisions.  A negative result may occur with improper specimen collection / handling, submission of specimen other than nasopharyngeal swab, presence of viral mutation(s) within the areas targeted by this assay, and inadequate number of viral copies (<250 copies / mL). A negative result must be combined with clinical observations, patient history, and epidemiological information.  Fact Sheet for Patients:   StrictlyIdeas.no  Fact Sheet for  Healthcare Providers: BankingDealers.co.za  This test is not yet approved  or  cleared by the [place] and has been authorized for detection and/or diagnosis of SARS-CoV-2 by FDA under an Emergency Use Authorization (EUA).  This EUA will remain in effect (meaning this test can be used) for the duration of the COVID-19 declaration under Section 564(b)(1) of the Act, 21 U.S.C. section 360bbb-3(b)(1), unless the authorization is terminated or revoked sooner.  Performed at [facility], [place] [address], [place], [place] [address]   SARS Coronavirus 2 by RT PCR (hospital order, performed in [facility] hospital lab) Nasopharyngeal Nasopharyngeal Swab     Status: None   Collection Time: 01/19/20  9:57 AM   Specimen: Nasopharyngeal Swab  Result Value Ref Range Status   SARS Coronavirus 2 NEGATIVE NEGATIVE Final    Comment: (NOTE) SARS-CoV-2 target nucleic acids are NOT DETECTED.  The SARS-CoV-2 RNA is generally detectable in upper and lower respiratory specimens during the acute phase of infection. The lowest concentration of SARS-CoV-2 viral copies this assay can detect is 250 copies / mL. A negative result does not preclude SARS-CoV-2 infection and should not be used as the sole basis for treatment or other patient management decisions.  A negative result may occur with improper specimen collection / handling, submission of specimen other than nasopharyngeal swab, presence of viral mutation(s) within the areas targeted by this assay, and inadequate number of viral copies (<250 copies / mL). A negative result must be combined with clinical observations, patient history, and epidemiological information.  Fact Sheet for Patients:   StrictlyIdeas.no  Fact Sheet for Healthcare Providers: BankingDealers.co.za  This test is not yet approved or  cleared by the [place] FDA and has been authorized for  detection and/or diagnosis of SARS-CoV-2 by FDA under an Emergency Use Authorization (EUA).  This EUA will remain in effect (meaning this test can be used) for the duration of the COVID-19 declaration under Section 564(b)(1) of the Act, 21 U.S.C. section 360bbb-3(b)(1), unless the authorization is terminated or revoked sooner.  Performed at [facility], [place] [address], [place], [place] [address]      Labs: Basic Metabolic Panel: Recent Labs  Lab 01/13/20 1559 01/13/20 1559 01/14/20 0023 01/14/20 0023 01/14/20 1800 01/15/20 0838 01/19/20 0838 01/19/20 1728 01/20/20 0044  NA 127*   < > 132*   < > 133* 129* 131* 130* 134*  K 7.2*   < > 4.2   < > 4.1 5.8* 6.5* 7.3* 3.8  CL 90*   < > 95*   < > 91* 93* 90* 87* 93*  CO2 21*   < > 24   < > 27 22 28 27 28   GLUCOSE 118*   < > 124*   < > 97 145* 97 182* 170*  BUN 38*   < > 15   < > 19 25* 31* 38* 15  CREATININE 5.77*   < > 3.15*   < > 3.32* 4.12* 4.38* 4.99* 2.61*  CALCIUM 9.4   < > 9.0   < > 9.0 9.8 9.3 9.6 9.2  MG 2.2  --  2.0  --   --  2.1  --   --  2.4  PHOS  --   --  3.9  --   --   --   --   --  4.1   < > = values in this interval not displayed.   Liver Function Tests: Recent Labs  Lab 01/13/20 1559 01/14/20 1926 01/15/20 1884 01/19/20 1660 01/20/20 0044  AST 54* 46* 39 108*  --   ALT 39 37 33 81*  --   ALKPHOS 314* 337* 308* 421*  --   BILITOT 1.1 0.7 0.8 1.0  --   PROT 6.8 7.0 6.9 6.8  --   ALBUMIN 2.6* 2.6* 2.5* 2.5* 2.7*   Recent Labs  Lab 01/13/20 1559  LIPASE 27   No results for input(s): AMMONIA in the last 168 hours. CBC: Recent Labs  Lab 01/13/20 1559 01/13/20 1559 01/14/20 0023 01/14/20 1800 01/15/20 0838 01/19/20 0838 01/20/20 0044  WBC 12.8*   < > 12.9* 11.0* 11.9* 12.7* 11.4*  NEUTROABS 10.1*  --   --  9.0* 11.2* 10.3*  --   HGB 12.0*   < > 11.0* 10.5* 11.4* 11.4* 11.5*  HCT 35.5*   < > 31.6* 31.0* 33.6* 33.3* 33.6*  MCV 81.6   < > 79.4* 81.2 82.4 80.0 80.4  PLT 283   < > 196 215  207 178 192   < > = values in this interval not displayed.   Cardiac Enzymes: No results for input(s): CKTOTAL, CKMB, CKMBINDEX, TROPONINI in the last 168 hours. BNP: BNP (last 3 results) Recent Labs    10/06/19 1159 11/12/19 2341 01/14/20 1927  BNP 2,690.1* 3,672.9* 2,254.1*    ProBNP (last 3 results) No results for input(s): PROBNP in the last 8760 hours.  CBG: Recent Labs  Lab 01/13/20 1545 01/13/20 1851 01/14/20 0023 01/14/20 0730  GLUCAP 139* 64* 122* 120*       Signed:  Kayleen Memos, MD [facility] 01/20/2020, 3:03 PM

## 2020-01-20 NOTE — Progress Notes (Signed)
Patient would not wait for his discharge paperwork to be done and he left AMA. Dr. Nevada Crane notified.

## 2020-01-21 IMAGING — CT CT ABD-PELV W/ CM
2 of 5 series · 16 of 46 positions shown, 18 images · IV contrast (APPLIED)
Comparison: 01/02/2018 and earlier.

CLINICAL DATA: 57-year-old with current history of end-stage renal
disease on hemodialysis, presenting with mid epigastric abdominal
pain and a possible palpable mass in the supraumbilical abdomen,
possibly a hernia. Current history of chronic hepatitis B and
hepatitis C with multiple prior paracenteses.

EXAM:
CT ABDOMEN AND PELVIS WITH CONTRAST
TECHNIQUE: Multidetector CT imaging of the abdomen and pelvis was performed
using the standard protocol following bolus administration of
intravenous contrast.
CONTRAST:  100mL OMNIPAQUE IOHEXOL 300 MG/ML IV.

[Series 3: abd/ pelvis 5.0 i30f 2 · axial · 0.76mm/px · z∈[+903,+1353]mm · 13 of 101 slices shown, 15 images]
[im 6/101  soft-tissue]
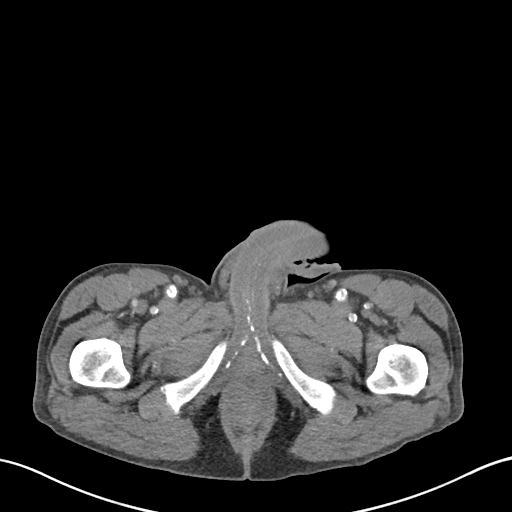
[im 6/101  bone]
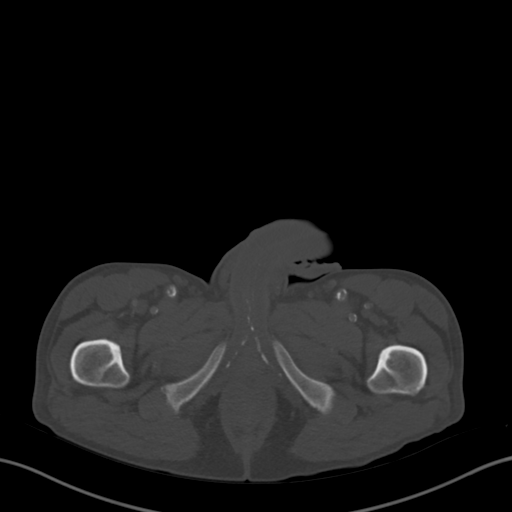
[im 16/101  soft-tissue]
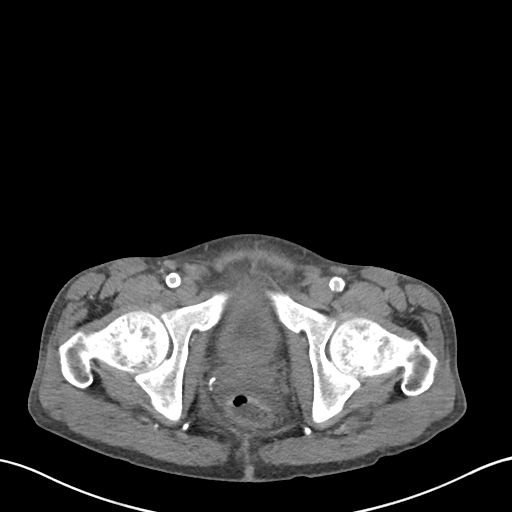
[im 21/101  soft-tissue]
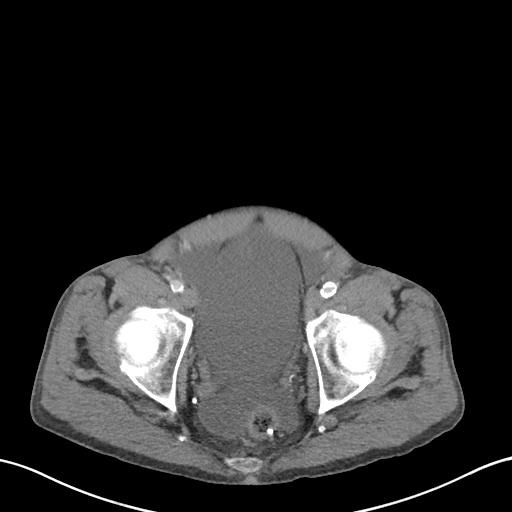
[im 31/101  soft-tissue]
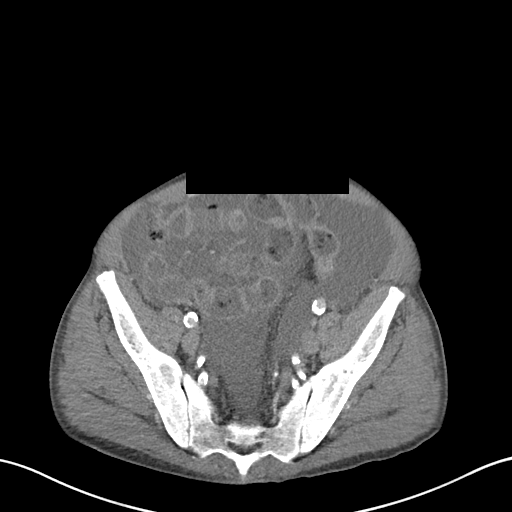
[im 36/101  soft-tissue]
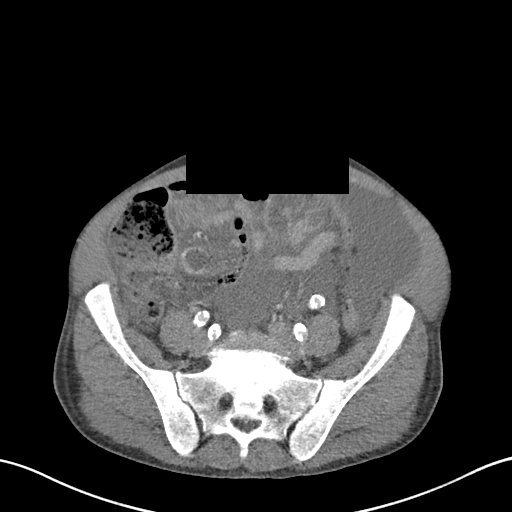
[im 46/101  soft-tissue]
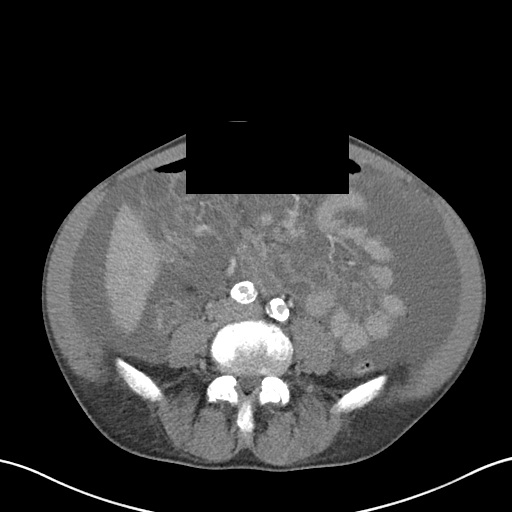
[im 51/101  soft-tissue]
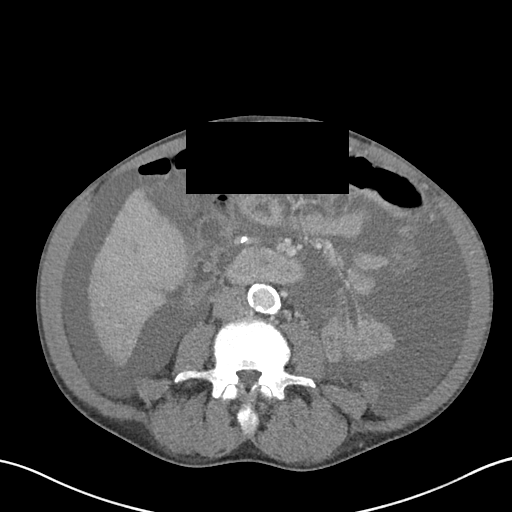
[im 56/101  soft-tissue]
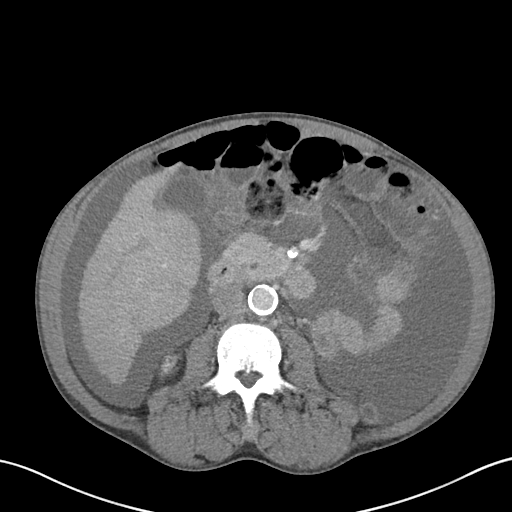
[im 66/101  soft-tissue]
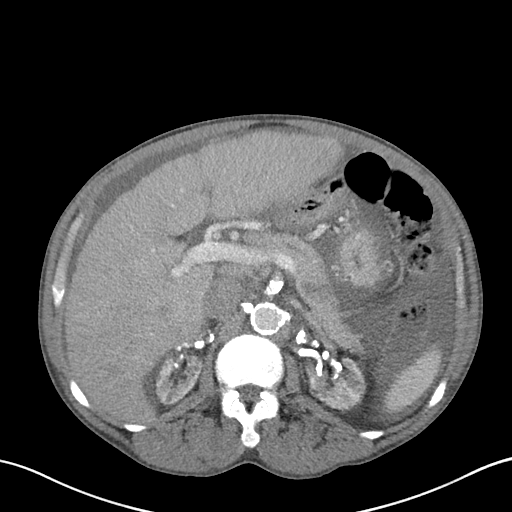
[im 66/101  bone]
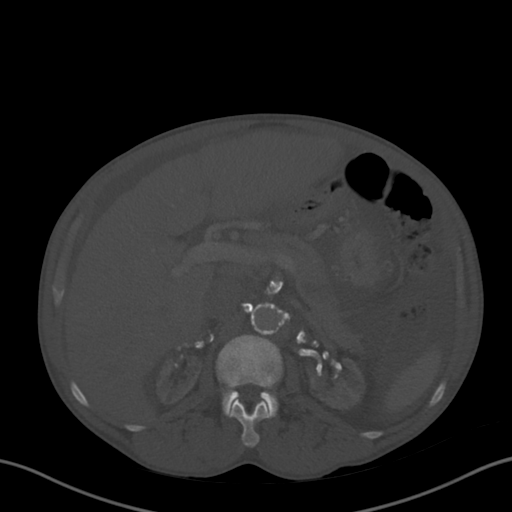
[im 71/101  soft-tissue]
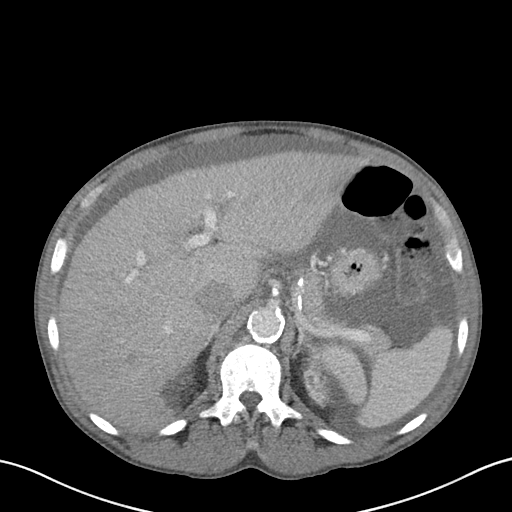
[im 81/101  soft-tissue]
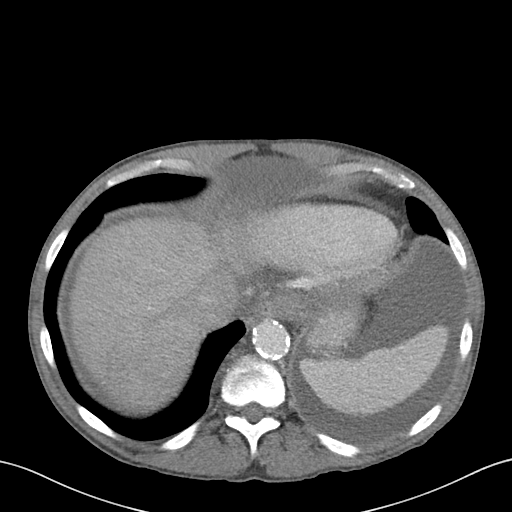
[im 86/101  soft-tissue]
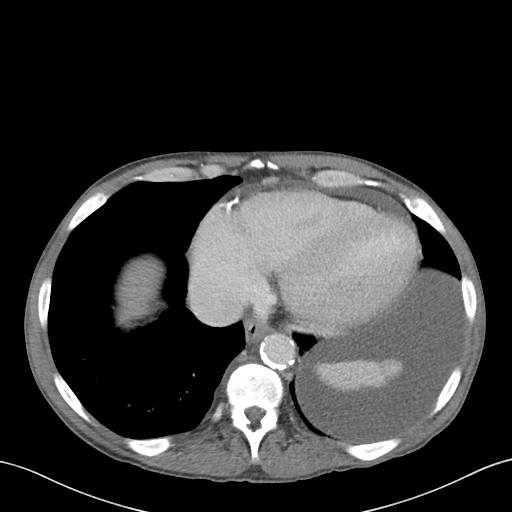
[im 96/101  soft-tissue]
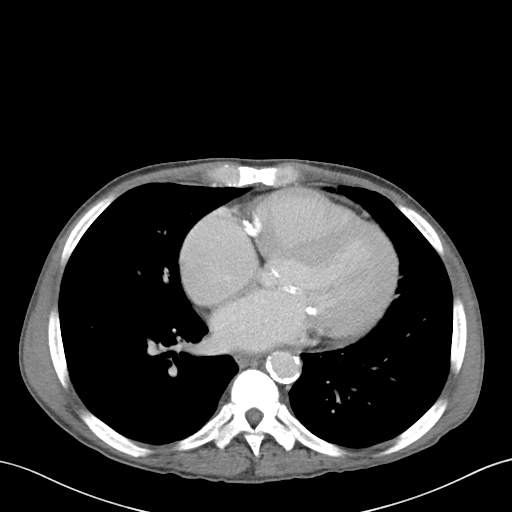

[Series 6: coronal soft tissue · coronal · 0.93mm/px · 3 of 101 slices shown]
[im 34/101  soft-tissue]
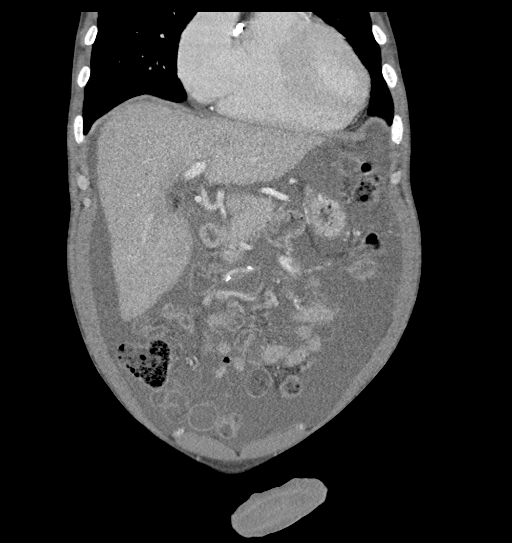
[im 45/101  soft-tissue]
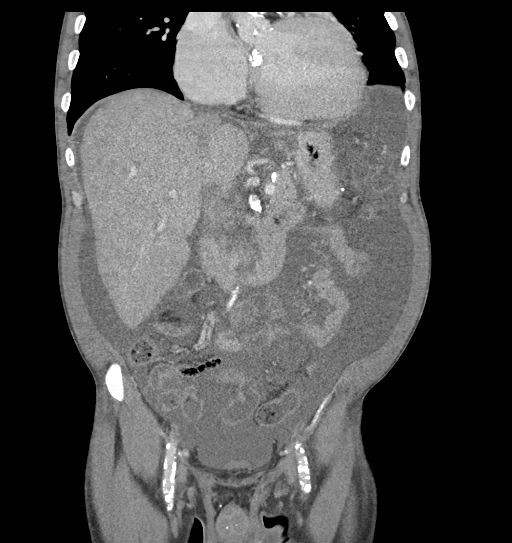
[im 56/101  soft-tissue]
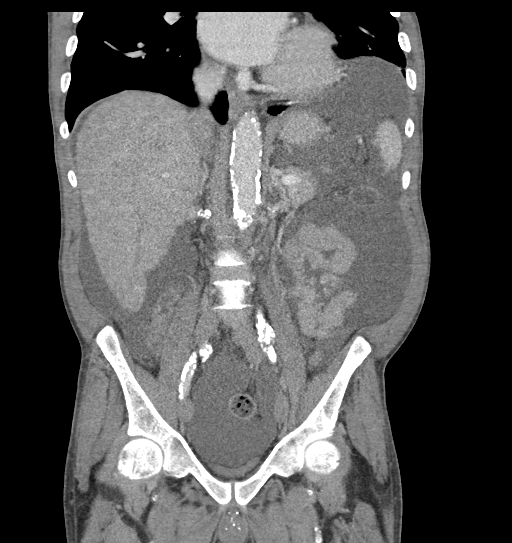

[16 of 46 positions shown; findings below may reference images not displayed]

FINDINGS: Lower chest: Mild elevation the LEFT hemidiaphragm as noted
previously. Visualized lung bases clear. Heart moderately enlarged.
Mitral annular calcification. Severe three-vessel coronary
atherosclerosis. Small pericardial effusion.

Hepatobiliary: Mild hepatomegaly and irregular hepatic contour as
noted previously. No hepatic parenchymal mass. Geographic areas of
hepatic steatosis throughout the liver. Gallbladder normal in
appearance without calcified gallstones. No biliary ductal dilation.

Pancreas: Normal in appearance without evidence of mass, ductal
dilation, or inflammation.

Spleen: Normal in size and appearance.

Adrenals/Urinary Tract: Normal appearing adrenal glands. Atrophic
kidneys containing multiple small cysts. No hydronephrosis. No
urinary tract calculi. Urinary bladder decompressed which accounts
for the apparent mild wall thickening.

Stomach/Bowel: Stomach decompressed and normal in appearance. No
evidence of bowel obstruction. Inspissated stool like material in
several loops of small bowel throughout the abdomen. Entire colon
relatively decompressed with expected stool burden. Appendix not
visualized.

Vascular/Lymphatic: Severe aortoiliofemoral and visceral artery
atherosclerosis without evidence of aneurysm. No pathologic
lymphadenopathy.

Reproductive: Prostate gland and seminal vesicles normal in size and
appearance for age.

Other: Large amount of ascites. No evidence of abdominal wall hernia
as questioned clinically.

Musculoskeletal: Renal osteodystrophy. Degenerative disc disease at
L5-S1. No acute findings.
IMPRESSION: 1. Large amount of ascites. No acute abnormalities otherwise
involving the abdomen or pelvis.
2. Hepatic cirrhosis. Geographic areas of hepatic steatosis. No
visible hepatic masses.
3. No evidence of abdominal wall hernia as questioned clinically.
4. Inspissated stool like material in several loops of small bowel
throughout the abdomen likely indicating stasis. No evidence of
bowel obstruction.
5. Atrophic kidneys indicating chronic renal failure with cystic
renal disease of hemodialysis.
6.  Aortic Atherosclerosis ([id number])
7. Marked cardiomegaly. Small pericardial effusion. Severe
three-vessel coronary atherosclerosis.

## 2020-01-22 ENCOUNTER — Telehealth: Payer: Self-pay

## 2020-01-22 NOTE — Telephone Encounter (Signed)
Pt called and asked if you could prescribe him 300 mg of gabapentin for his nerve pain?

## 2020-01-22 NOTE — Telephone Encounter (Signed)
How often does he want to take it on a daily basis, BID, TID or daily

## 2020-01-23 ENCOUNTER — Other Ambulatory Visit: Payer: Self-pay

## 2020-01-23 MED ORDER — GABAPENTIN 300 MG PO CAPS: 300.0000 mg | ORAL_CAPSULE | Freq: Two times a day (BID) | ORAL | 3 refills | Status: DC

## 2020-01-24 ENCOUNTER — Other Ambulatory Visit (HOSPITAL_COMMUNITY): Payer: Self-pay | Admitting: Gastroenterology

## 2020-01-24 ENCOUNTER — Ambulatory Visit (HOSPITAL_COMMUNITY)
Admission: RE | Admit: 2020-01-24 | Discharge: 2020-01-24 | Disposition: A | Discharge status: Home / Self Care | Payer: Medicare Other | Source: Ambulatory Visit | Attending: Gastroenterology | Admitting: Gastroenterology

## 2020-01-24 ENCOUNTER — Other Ambulatory Visit: Payer: Self-pay

## 2020-01-24 DIAGNOSIS — R188 Other ascites: Secondary | ICD-10-CM | POA: Insufficient documentation

## 2020-01-24 DIAGNOSIS — K7460 Unspecified cirrhosis of liver: Secondary | ICD-10-CM

## 2020-01-24 DIAGNOSIS — J441 Chronic obstructive pulmonary disease with (acute) exacerbation: Secondary | ICD-10-CM | POA: Diagnosis not present

## 2020-01-24 HISTORY — PX: IR PARACENTESIS: IMG2679

## 2020-01-24 MED ORDER — LIDOCAINE HCL 1 % IJ SOLN
INTRAMUSCULAR | Status: AC
  Filled 2020-01-24: qty 20

## 2020-01-24 MED ORDER — LIDOCAINE HCL (PF) 1 % IJ SOLN
INTRAMUSCULAR | Status: DC | PRN
  Administered 2020-01-24: 20 mL

## 2020-01-24 NOTE — Procedures (Signed)
PROCEDURE SUMMARY:  Successful US guided paracentesis from left lateral abdomen.  Yielded 1.4 liters of amber fluid.  No immediate complications.  Patient tolerated well.  EBL = trace   Mcclellan Demarais S Libia Fazzini PA-C 01/24/2020 2:52 PM

## 2020-01-25 ENCOUNTER — Emergency Department (HOSPITAL_COMMUNITY): Payer: Medicare Other

## 2020-01-25 ENCOUNTER — Encounter (HOSPITAL_COMMUNITY): Payer: Self-pay

## 2020-01-25 ENCOUNTER — Inpatient Hospital Stay (HOSPITAL_COMMUNITY)
Admission: EM | Admit: 2020-01-25 | Discharge: 2020-01-29 | DRG: 190 | Disposition: A | Discharge status: Home / Self Care | Payer: Medicare Other | Attending: Internal Medicine | Admitting: Internal Medicine

## 2020-01-25 DIAGNOSIS — D649 Anemia, unspecified: Secondary | ICD-10-CM | POA: Diagnosis present

## 2020-01-25 DIAGNOSIS — I4891 Unspecified atrial fibrillation: Secondary | ICD-10-CM

## 2020-01-25 DIAGNOSIS — J811 Chronic pulmonary edema: Secondary | ICD-10-CM | POA: Diagnosis present

## 2020-01-25 DIAGNOSIS — Z9114 Patient's other noncompliance with medication regimen: Secondary | ICD-10-CM

## 2020-01-25 DIAGNOSIS — Z888 Allergy status to other drugs, medicaments and biological substances status: Secondary | ICD-10-CM

## 2020-01-25 DIAGNOSIS — E8889 Other specified metabolic disorders: Secondary | ICD-10-CM | POA: Diagnosis present

## 2020-01-25 DIAGNOSIS — F419 Anxiety disorder, unspecified: Secondary | ICD-10-CM | POA: Diagnosis present

## 2020-01-25 DIAGNOSIS — J9601 Acute respiratory failure with hypoxia: Secondary | ICD-10-CM | POA: Diagnosis present

## 2020-01-25 DIAGNOSIS — J449 Chronic obstructive pulmonary disease, unspecified: Secondary | ICD-10-CM | POA: Diagnosis present

## 2020-01-25 DIAGNOSIS — Z801 Family history of malignant neoplasm of trachea, bronchus and lung: Secondary | ICD-10-CM

## 2020-01-25 DIAGNOSIS — T380X5A Adverse effect of glucocorticoids and synthetic analogues, initial encounter: Secondary | ICD-10-CM | POA: Diagnosis not present

## 2020-01-25 DIAGNOSIS — Z992 Dependence on renal dialysis: Secondary | ICD-10-CM

## 2020-01-25 DIAGNOSIS — I2510 Atherosclerotic heart disease of native coronary artery without angina pectoris: Secondary | ICD-10-CM | POA: Diagnosis present

## 2020-01-25 DIAGNOSIS — K7460 Unspecified cirrhosis of liver: Secondary | ICD-10-CM | POA: Diagnosis present

## 2020-01-25 DIAGNOSIS — I484 Atypical atrial flutter: Secondary | ICD-10-CM | POA: Diagnosis present

## 2020-01-25 DIAGNOSIS — K219 Gastro-esophageal reflux disease without esophagitis: Secondary | ICD-10-CM | POA: Diagnosis present

## 2020-01-25 DIAGNOSIS — Z955 Presence of coronary angioplasty implant and graft: Secondary | ICD-10-CM

## 2020-01-25 DIAGNOSIS — J9621 Acute and chronic respiratory failure with hypoxia: Secondary | ICD-10-CM | POA: Diagnosis present

## 2020-01-25 DIAGNOSIS — Z886 Allergy status to analgesic agent status: Secondary | ICD-10-CM

## 2020-01-25 DIAGNOSIS — Z9115 Patient's noncompliance with renal dialysis: Secondary | ICD-10-CM

## 2020-01-25 DIAGNOSIS — I472 Ventricular tachycardia: Secondary | ICD-10-CM | POA: Diagnosis present

## 2020-01-25 DIAGNOSIS — I4819 Other persistent atrial fibrillation: Secondary | ICD-10-CM | POA: Diagnosis present

## 2020-01-25 DIAGNOSIS — Z800 Family history of malignant neoplasm of digestive organs: Secondary | ICD-10-CM

## 2020-01-25 DIAGNOSIS — Z8249 Family history of ischemic heart disease and other diseases of the circulatory system: Secondary | ICD-10-CM

## 2020-01-25 DIAGNOSIS — J441 Chronic obstructive pulmonary disease with (acute) exacerbation: Principal | ICD-10-CM | POA: Diagnosis present

## 2020-01-25 DIAGNOSIS — F1411 Cocaine abuse, in remission: Secondary | ICD-10-CM | POA: Diagnosis present

## 2020-01-25 DIAGNOSIS — I447 Left bundle-branch block, unspecified: Secondary | ICD-10-CM | POA: Diagnosis present

## 2020-01-25 DIAGNOSIS — Z6822 Body mass index (BMI) 22.0-22.9, adult: Secondary | ICD-10-CM

## 2020-01-25 DIAGNOSIS — Z885 Allergy status to narcotic agent status: Secondary | ICD-10-CM

## 2020-01-25 DIAGNOSIS — F17210 Nicotine dependence, cigarettes, uncomplicated: Secondary | ICD-10-CM | POA: Diagnosis present

## 2020-01-25 DIAGNOSIS — N186 End stage renal disease: Secondary | ICD-10-CM | POA: Diagnosis present

## 2020-01-25 DIAGNOSIS — I959 Hypotension, unspecified: Secondary | ICD-10-CM | POA: Diagnosis present

## 2020-01-25 DIAGNOSIS — I252 Old myocardial infarction: Secondary | ICD-10-CM

## 2020-01-25 DIAGNOSIS — E875 Hyperkalemia: Secondary | ICD-10-CM | POA: Diagnosis present

## 2020-01-25 DIAGNOSIS — N2581 Secondary hyperparathyroidism of renal origin: Secondary | ICD-10-CM | POA: Diagnosis present

## 2020-01-25 DIAGNOSIS — K7031 Alcoholic cirrhosis of liver with ascites: Secondary | ICD-10-CM | POA: Diagnosis present

## 2020-01-25 DIAGNOSIS — Z20822 Contact with and (suspected) exposure to covid-19: Secondary | ICD-10-CM | POA: Diagnosis present

## 2020-01-25 DIAGNOSIS — Z560 Unemployment, unspecified: Secondary | ICD-10-CM

## 2020-01-25 DIAGNOSIS — D122 Benign neoplasm of ascending colon: Secondary | ICD-10-CM | POA: Diagnosis present

## 2020-01-25 DIAGNOSIS — Z79899 Other long term (current) drug therapy: Secondary | ICD-10-CM

## 2020-01-25 DIAGNOSIS — M19012 Primary osteoarthritis, left shoulder: Secondary | ICD-10-CM | POA: Diagnosis present

## 2020-01-25 DIAGNOSIS — I499 Cardiac arrhythmia, unspecified: Secondary | ICD-10-CM | POA: Diagnosis present

## 2020-01-25 DIAGNOSIS — D72829 Elevated white blood cell count, unspecified: Secondary | ICD-10-CM | POA: Diagnosis not present

## 2020-01-25 DIAGNOSIS — Z9119 Patient's noncompliance with other medical treatment and regimen: Secondary | ICD-10-CM

## 2020-01-25 DIAGNOSIS — Z825 Family history of asthma and other chronic lower respiratory diseases: Secondary | ICD-10-CM

## 2020-01-25 DIAGNOSIS — E8770 Fluid overload, unspecified: Secondary | ICD-10-CM | POA: Diagnosis present

## 2020-01-25 DIAGNOSIS — F1211 Cannabis abuse, in remission: Secondary | ICD-10-CM | POA: Diagnosis present

## 2020-01-25 DIAGNOSIS — I1311 Hypertensive heart and chronic kidney disease without heart failure, with stage 5 chronic kidney disease, or end stage renal disease: Secondary | ICD-10-CM | POA: Diagnosis present

## 2020-01-25 DIAGNOSIS — B181 Chronic viral hepatitis B without delta-agent: Secondary | ICD-10-CM | POA: Diagnosis present

## 2020-01-25 DIAGNOSIS — B182 Chronic viral hepatitis C: Secondary | ICD-10-CM | POA: Diagnosis present

## 2020-01-25 DIAGNOSIS — F1010 Alcohol abuse, uncomplicated: Secondary | ICD-10-CM | POA: Diagnosis present

## 2020-01-25 DIAGNOSIS — Z86010 Personal history of colonic polyps: Secondary | ICD-10-CM

## 2020-01-25 DIAGNOSIS — I10 Essential (primary) hypertension: Secondary | ICD-10-CM | POA: Diagnosis present

## 2020-01-25 DIAGNOSIS — Z596 Low income: Secondary | ICD-10-CM

## 2020-01-25 DIAGNOSIS — Z66 Do not resuscitate: Secondary | ICD-10-CM | POA: Diagnosis present

## 2020-01-25 DIAGNOSIS — Z720 Tobacco use: Secondary | ICD-10-CM | POA: Diagnosis present

## 2020-01-25 DIAGNOSIS — R64 Cachexia: Secondary | ICD-10-CM | POA: Diagnosis present

## 2020-01-25 DIAGNOSIS — R9431 Abnormal electrocardiogram [ECG] [EKG]: Secondary | ICD-10-CM | POA: Diagnosis present

## 2020-01-25 LAB — COMPREHENSIVE METABOLIC PANEL
ALT: 64 U/L — ABNORMAL HIGH (ref 0–44)
AST: 73 U/L — ABNORMAL HIGH (ref 15–41)
Albumin: 2.6 g/dL — ABNORMAL LOW (ref 3.5–5.0)
Alkaline Phosphatase: 364 U/L — ABNORMAL HIGH (ref 38–126)
Anion gap: 15 (ref 5–15)
BUN: 28 mg/dL — ABNORMAL HIGH (ref 6–20)
CO2: 29 mmol/L (ref 22–32)
Calcium: 9.5 mg/dL (ref 8.9–10.3)
Chloride: 89 mmol/L — ABNORMAL LOW (ref 98–111)
Creatinine, Ser: 3.94 mg/dL — ABNORMAL HIGH (ref 0.61–1.24)
GFR calc Af Amer: 18 mL/min — ABNORMAL LOW (ref >60)
GFR calc non Af Amer: 16 mL/min — ABNORMAL LOW (ref >60)
Glucose, Bld: 102 mg/dL — ABNORMAL HIGH (ref 70–99)
Potassium: 5.9 mmol/L — ABNORMAL HIGH (ref 3.5–5.1)
Sodium: 133 mmol/L — ABNORMAL LOW (ref 135–145)
Total Bilirubin: 1 mg/dL (ref 0.3–1.2)
Total Protein: 6.5 g/dL (ref 6.5–8.1)

## 2020-01-25 LAB — CBC WITH DIFFERENTIAL/PLATELET
Abs Immature Granulocytes: 0.1 10*3/uL — ABNORMAL HIGH (ref 0.00–0.07)
Basophils Absolute: 0.1 10*3/uL (ref 0.0–0.1)
Basophils Relative: 1 %
Eosinophils Absolute: 0.2 10*3/uL (ref 0.0–0.5)
Eosinophils Relative: 2 %
HCT: 35.6 % — ABNORMAL LOW (ref 39.0–52.0)
Hemoglobin: 12.2 g/dL — ABNORMAL LOW (ref 13.0–17.0)
Immature Granulocytes: 1 %
Lymphocytes Relative: 11 %
Lymphs Abs: 1.3 10*3/uL (ref 0.7–4.0)
MCH: 27.4 pg (ref 26.0–34.0)
MCHC: 34.3 g/dL (ref 30.0–36.0)
MCV: 80 fL (ref 80.0–100.0)
Monocytes Absolute: 1.5 10*3/uL — ABNORMAL HIGH (ref 0.1–1.0)
Monocytes Relative: 12 %
Neutro Abs: 8.8 10*3/uL — ABNORMAL HIGH (ref 1.7–7.7)
Neutrophils Relative %: 73 %
Platelets: 232 10*3/uL (ref 150–400)
RBC: 4.45 MIL/uL (ref 4.22–5.81)
RDW: 15.8 % — ABNORMAL HIGH (ref 11.5–15.5)
WBC: 12 10*3/uL — ABNORMAL HIGH (ref 4.0–10.5)
nRBC: 0 % (ref 0.0–0.2)

## 2020-01-25 MED ORDER — MAGNESIUM SULFATE 2 GM/50ML IV SOLN
2.0000 g | Freq: Once | INTRAVENOUS | Status: AC
  Administered 2020-01-26: 2 g via INTRAVENOUS
  Filled 2020-01-25: qty 50

## 2020-01-25 MED ORDER — HYDROMORPHONE HCL 1 MG/ML IJ SOLN
0.5000 mg | Freq: Once | INTRAMUSCULAR | Status: AC
  Administered 2020-01-25: 0.5 mg via INTRAVENOUS
  Filled 2020-01-25: qty 1

## 2020-01-25 MED ORDER — METHYLPREDNISOLONE SODIUM SUCC 125 MG IJ SOLR
125.0000 mg | Freq: Once | INTRAMUSCULAR | Status: AC
  Administered 2020-01-26: 125 mg via INTRAVENOUS
  Filled 2020-01-25: qty 2

## 2020-01-25 MED ORDER — ALBUTEROL SULFATE HFA 108 (90 BASE) MCG/ACT IN AERS
2.0000 | INHALATION_SPRAY | RESPIRATORY_TRACT | Status: DC | PRN
  Administered 2020-01-25: 2 via RESPIRATORY_TRACT
  Filled 2020-01-25 (×2): qty 6.7

## 2020-01-25 MED ORDER — IPRATROPIUM BROMIDE HFA 17 MCG/ACT IN AERS
2.0000 | INHALATION_SPRAY | Freq: Once | RESPIRATORY_TRACT | Status: AC
  Administered 2020-01-25: 2 via RESPIRATORY_TRACT
  Filled 2020-01-25: qty 12.9

## 2020-01-25 NOTE — ED Provider Notes (Signed)
11:50 PM Care assumed from Dr. Maryan Rued, patient with COPD and cirrhosis and end-stage renal disease on hemodialysis presents with wheezing.  He had a normal dialysis session earlier today.  He had no improvement with first dose of albuterol and ipratropium via inhaler.  He is getting a second course of inhalers.  Will check Covid antigen test.  Anticipate need for nebulizer treatments and possible admission.  12:45 AM Patient now has recurrence of tachycardia.  Repeat ECG shows sinus tachycardia.  Prior ECG appears to show episodes of Mobitz 2 AV block which is not present now, however block may actually just be physiologic response to atrial flutter.  Atrial rate seems similar.  Lungs have few scattered wheezes.  Will give additional albuterol.  He is complaining of need for dressing for his sacral decubitus and this is done.   EKG Interpretation  Date/Time:  Saturday January 26 2020 00:32:21 EDT Ventricular Rate:  161 PR Interval:    QRS Duration: 161 QT Interval:  356 QTC Calculation: 583 R Axis:   -105 Text Interpretation: Sinus tachycardia Consider right atrial enlargement Nonspecific IVCD with LAD Anterolateral infarct, age undetermined Baseline wander in lead(s) I II aVR When compared with ECG of 01/25/2020, Mobitz II 2-degree AV block is no longer seen Confirmed by Delora Fuel ([id number]) on 01/26/2020 12:44:36 AM       2:37 AM He continues to be wheezing and is given an additional albuterol with ipratropium.  He is complaining of itching and is given a dose of diphenhydramine.  Heart rate continues to be in the 120s.  5:23 AM Covid screen is negative and is given DuoNeb treatment.  He continues to be oxygen dependent.  Heart rhythm seems to be in atrial fibrillation/atrial flutter now with heart rate about 150.  Blood pressure is a little soft and he could not tolerate diltiazem so he was given low-dose of metoprolol to try to achieve better rate control.  He is unstable and will need to be  admitted.  Case is discussed with Dr. Marice Potter of [facility], who agrees to admit the patient.  CRITICAL CARE Performed by: Delora Fuel Total critical care time: 40 minutes Critical care time was exclusive of separately billable procedures and treating other patients. Critical care was necessary to treat or prevent imminent or life-threatening deterioration. Critical care was time spent personally by me on the following activities: development of treatment plan with patient and/or surrogate as well as nursing, discussions with consultants, evaluation of patient's response to treatment, examination of patient, obtaining history from patient or surrogate, ordering and performing treatments and interventions, ordering and review of laboratory studies, ordering and review of radiographic studies, pulse oximetry and re-evaluation of patient's condition.   EKG Interpretation  Date/Time:  Saturday January 26 2020 00:32:21 EDT Ventricular Rate:  161 PR Interval:    QRS Duration: 161 QT Interval:  356 QTC Calculation: 583 R Axis:   -105 Text Interpretation: Sinus tachycardia Consider right atrial enlargement Nonspecific IVCD with LAD Anterolateral infarct, age undetermined Baseline wander in lead(s) I II aVR When compared with ECG of 01/25/2020, Mobitz II 2-degree AV block is no longer seen Confirmed by Delora Fuel ([id number]) on 01/26/2020 43:32:95 AM         Delora Fuel, MD 18/84/16 [phone]

## 2020-01-25 NOTE — ED Triage Notes (Signed)
Pt comes via [facility] from home for SOB was in vtach for appx 10 minutes now in afib, last dialysis today, last paracentesis on Thursday

## 2020-01-25 NOTE — ED Provider Notes (Signed)
[facility] EMERGENCY DEPARTMENT Provider Note   CSN: [id number] Arrival date & time: 01/25/20  2204     History Chief Complaint  Patient presents with  . Atrial Fibrillation    [name] is a 59 y.o. male.  59y/o male with hx of hypertension ESRD on HD(MWF), CAD(last cath 2018 S/P DES to RCA), PAF, cirrhosis, hep B & C, EtOH abuse, COPD, and tobacco abuse presenting today with persistent shortness of breath.  Patient states the shortness of breath has been going on for a while but he is having a more productive cough.  He did have dialysis today and reports had a paracentesis yesterday which appeared to be 1.5 L were withdrawn.  Patient denies any fever.  He reports he does continue to smoke and drink alcohol and feels that he would benefit from oxygen at home but reports cannot get any.  Patient states he has been using his inhaler at home but has to use it repeatedly to get it to work.  He reports having a headache today but denies any chest or abdominal pain.  EMS reported when they arrived today patient was in a tachycardic rhythm that at times looks like V. tach but then also look like A. fib RVR.  Patient has been awake alert and stable throughout transport.  She did not receive any medications in route.  The history is provided by the patient.  Atrial Fibrillation       Past Medical History:  Diagnosis Date  . Anemia   . Anxiety   . Arthritis    left shoulder  . Atherosclerosis of aorta ([place])   . Cardiomegaly   . Chest pain    DATE UNKNOWN, C/O PERIODICALLY  . Cocaine abuse ([place])   . COPD exacerbation ([place]) 08/17/2016  . Coronary artery disease    stent 02/22/17  . ESRD (end stage renal disease) on dialysis ([place])    "[name]; MWF" (07/04/2017)  . GERD (gastroesophageal reflux disease)    DATE UNKNOWN  . Hemorrhoids   . Hepatitis B, chronic ([place])   . Hepatitis C   . History of kidney stones   . Hyperkalemia   . Hypertension   .  Kidney failure   . Metabolic bone disease    Patient denies  . Mitral stenosis   . Myocardial infarction ([place])   . Pneumonia   . Pulmonary edema   . Solitary rectal ulcer syndrome 07/2017   at flex sig for rectal bleeding  . Tubular adenoma of colon     Patient Active Problem List   Diagnosis Date Noted  . Leukocytosis 01/19/2020  . Tobacco use 01/19/2020  . Acute pulmonary edema ([place]) 12/21/2019  . Acetaminophen overdose 10/27/2019  . ESRD (end stage renal disease) ([place])   . Goals of care, counseling/discussion   . Hypoxia 10/04/2019  . Advanced care planning/counseling discussion   . Renal failure 09/26/2019  . Dyspnea 06/09/2019  . Acquired thrombophilia ([place]) 06/05/2019  . A-fib ([place]) 05/30/2019  . Atrial fibrillation with RVR ([place]) 05/29/2019  . Melena   . Pressure injury of skin 03/09/2019  . Abdominal distention   . Volume overload 12/28/2018  . Sepsis ([place]) 09/12/2018  . Coronary artery disease involving native coronary artery of native heart without angina pectoris 03/11/2018  . Benign neoplasm of cecum   . Benign neoplasm of ascending colon   . Benign neoplasm of descending colon   . Benign neoplasm of rectum   . AF (  paroxysmal atrial fibrillation) ([place]) 01/23/2018  . Hx of colonic polyps 01/20/2018  . End-stage renal disease on hemodialysis ([place]) 11/21/2017  . GERD (gastroesophageal reflux disease) 11/16/2017  . Decompensated hepatic cirrhosis ([place]) 11/15/2017  . Palliative care by specialist   . Hyponatremia 11/04/2017  . SBP (spontaneous bacterial peritonitis) ([place]) 10/30/2017  . Liver disease, chronic 10/30/2017  . SOB (shortness of breath)   . Abdominal pain 10/28/2017  . Upper airway cough syndrome with flattening on f/v loop 10/13/17 c/w vcd 10/17/2017  . Elevated diaphragm 10/13/2017  . Ileus ([place]) 09/29/2017  . QT prolongation 09/29/2017  . Malnutrition of moderate degree 09/29/2017  . Sinus congestion 09/03/2017  . Symptomatic anemia 09/02/2017    . Cirrhosis of liver with ascites ([place]) 09/02/2017  . Left bundle branch block 09/02/2017  . Mitral stenosis 09/02/2017  . Hematochezia 07/15/2017  . Wide-complex tachycardia ([place])   . Endotracheally intubated   . ESRD on dialysis ([place]) 07/04/2017  . Acute respiratory failure with hypoxia ([place]) 06/18/2017  . CKD (chronic kidney disease) stage V requiring chronic dialysis ([place]) 06/18/2017  . History of Cocaine abuse ([place]) 06/18/2017  . Hypertension 06/18/2017  . Infection of AV graft for dialysis ([place]) 06/18/2017  . Anxiety 06/18/2017  . Anemia due to chronic kidney disease 06/18/2017  . Atypical atrial flutter ([place]) 06/18/2017  . Personality disorder ([place]) 06/13/2017  . Cellulitis 06/12/2017  . Adjustment disorder with mixed anxiety and depressed mood 06/10/2017  . Suicidal ideation 06/10/2017  . Arm wound, left, sequela 06/10/2017  . Dyspnea on exertion 05/29/2017  . Tachycardia 05/29/2017  . Hyperkalemia 05/22/2017  . Acute metabolic encephalopathy   . Anemia 04/23/2017  . Ascites 04/23/2017  . COPD (chronic obstructive pulmonary disease) ([place]) 04/23/2017  . Acute on chronic respiratory failure with hypoxia ([place]) 03/25/2017  . Arrhythmia 03/25/2017  . COPD GOLD 0 with flattening on inps f/v  09/27/2016  . Essential hypertension 09/27/2016  . Fluid overload 08/30/2016  . COPD exacerbation ([place]) 08/17/2016  . Hypertensive urgency 08/17/2016  . Problem with dialysis access ([place]) 07/23/2016  . Chronic hepatitis B ([place]) 03/05/2014  . Chronic hepatitis C without hepatic coma ([place]) 03/05/2014  . Internal hemorrhoids with bleeding, swelling and itching 03/05/2014  . Thrombocytopenia ([place]) 03/05/2014  . Chest pain 02/27/2014  . Alcohol abuse 04/14/2009  . Nicotine dependence, cigarettes, uncomplicated 17/51/0258  . GANGLION CYST 04/14/2009    Past Surgical History:  Procedure Laterality Date  . A/V FISTULAGRAM Left 05/26/2017   Procedure: A/V FISTULAGRAM;  Surgeon: Conrad Allison Park, MD;  Location: [place] [facility];  Service: Cardiovascular;  Laterality: Left;  . A/V FISTULAGRAM Right 11/18/2017   Procedure: A/V FISTULAGRAM - Right Arm;  Surgeon: Elam Dutch, MD;  Location: [place] [facility];  Service: Cardiovascular;  Laterality: Right;  . APPLICATION OF WOUND VAC Left 06/14/2017   Procedure: APPLICATION OF WOUND VAC;  Surgeon: Katha Cabal, MD;  Location: [facility];  Service: Vascular;  Laterality: Left;  . AV FISTULA PLACEMENT  2012   BELIEVED WAS PLACED IN JUNE  . AV FISTULA PLACEMENT Right 08/09/2017   Procedure: Creation Right arm ARTERIOVENOUS BRACHIOCEPOHALIC FISTULA;  Surgeon: Elam Dutch, MD;  Location: [facility] OR;  Service: Vascular;  Laterality: Right;  . AV FISTULA PLACEMENT Right 11/22/2017   Procedure: INSERTION OF ARTERIOVENOUS (AV) GORE-TEX GRAFT RIGHT UPPER ARM;  Surgeon: Elam Dutch, MD;  Location: [place];  Service: Vascular;  Laterality: Right;  . BIOPSY  01/25/2018   Procedure:  BIOPSY;  Surgeon: Jerene Bears, MD;  Location: [place];  Service: Gastroenterology;;  . BIOPSY  04/10/2019   Procedure: BIOPSY;  Surgeon: Jerene Bears, MD;  Location: [facility];  Service: Gastroenterology;;  . COLONOSCOPY    . COLONOSCOPY WITH PROPOFOL N/A 01/25/2018   Procedure: COLONOSCOPY WITH PROPOFOL;  Surgeon: Jerene Bears, MD;  Location: [place];  Service: Gastroenterology;  Laterality: N/A;  . CORONARY STENT INTERVENTION N/A 02/22/2017   Procedure: CORONARY STENT INTERVENTION;  Surgeon: Nigel Mormon, MD;  Location: [place] [facility];  Service: Cardiovascular;  Laterality: N/A;  . ESOPHAGOGASTRODUODENOSCOPY (EGD) WITH PROPOFOL N/A 01/25/2018   Procedure: ESOPHAGOGASTRODUODENOSCOPY (EGD) WITH PROPOFOL;  Surgeon: Jerene Bears, MD;  Location: [place];  Service: Gastroenterology;  Laterality: N/A;  . ESOPHAGOGASTRODUODENOSCOPY (EGD) WITH PROPOFOL N/A 04/10/2019   Procedure: ESOPHAGOGASTRODUODENOSCOPY (EGD) WITH PROPOFOL;   Surgeon: Jerene Bears, MD;  Location: [facility];  Service: Gastroenterology;  Laterality: N/A;  . FLEXIBLE SIGMOIDOSCOPY N/A 07/15/2017   Procedure: FLEXIBLE SIGMOIDOSCOPY;  Surgeon: Carol Ada, MD;  Location: [place];  Service: Endoscopy;  Laterality: N/A;  . HEMORRHOID BANDING    . I & D EXTREMITY Left 06/01/2017   Procedure: IRRIGATION AND DEBRIDEMENT LEFT ARM HEMATOMA WITH LIGATION OF LEFT ARM AV FISTULA;  Surgeon: Elam Dutch, MD;  Location: [place];  Service: Vascular;  Laterality: Left;  . I & D EXTREMITY Left 06/14/2017   Procedure: IRRIGATION AND DEBRIDEMENT EXTREMITY;  Surgeon: Katha Cabal, MD;  Location: [facility];  Service: Vascular;  Laterality: Left;  . INSERTION OF DIALYSIS CATHETER  05/30/2017  . INSERTION OF DIALYSIS CATHETER N/A 05/30/2017   Procedure: INSERTION OF DIALYSIS CATHETER;  Surgeon: Elam Dutch, MD;  Location: [place];  Service: Vascular;  Laterality: N/A;  . IR PARACENTESIS  08/30/2017  . IR PARACENTESIS  09/29/2017  . IR PARACENTESIS  10/28/2017  . IR PARACENTESIS  11/09/2017  . IR PARACENTESIS  11/16/2017  . IR PARACENTESIS  11/28/2017  . IR PARACENTESIS  12/01/2017  . IR PARACENTESIS  12/06/2017  . IR PARACENTESIS  01/03/2018  . IR PARACENTESIS  01/23/2018  . IR PARACENTESIS  02/07/2018  . IR PARACENTESIS  02/21/2018  . IR PARACENTESIS  03/06/2018  . IR PARACENTESIS  03/17/2018  . IR PARACENTESIS  04/04/2018  . IR PARACENTESIS  12/28/2018  . IR PARACENTESIS  01/08/2019  . IR PARACENTESIS  01/23/2019  . IR PARACENTESIS  02/01/2019  . IR PARACENTESIS  02/19/2019  . IR PARACENTESIS  03/01/2019  . IR PARACENTESIS  03/15/2019  . IR PARACENTESIS  04/03/2019  . IR PARACENTESIS  04/12/2019  . IR PARACENTESIS  05/01/2019  . IR PARACENTESIS  05/08/2019  . IR PARACENTESIS  05/24/2019  . IR PARACENTESIS  06/12/2019  . IR PARACENTESIS  07/09/2019  . IR PARACENTESIS  07/27/2019  . IR PARACENTESIS  08/09/2019  . IR PARACENTESIS  08/21/2019  . IR PARACENTESIS   09/17/2019  . IR PARACENTESIS  10/05/2019  . IR PARACENTESIS  10/29/2019  . IR PARACENTESIS  11/08/2019  . IR PARACENTESIS  12/12/2019  . IR PARACENTESIS  01/03/2020  . IR PARACENTESIS  01/10/2020  . IR PARACENTESIS  01/17/2020  . IR PARACENTESIS  01/24/2020  . IR RADIOLOGIST EVAL & MGMT  02/14/2018  . IR RADIOLOGIST EVAL & MGMT  02/22/2019  . LEFT HEART CATH AND CORONARY ANGIOGRAPHY N/A 02/22/2017   Procedure: LEFT HEART CATH AND CORONARY ANGIOGRAPHY;  Surgeon: Nigel Mormon, MD;  Location: [place]  [facility];  Service: Cardiovascular;  Laterality: N/A;  . LIGATION OF ARTERIOVENOUS  FISTULA Left 3/0/1601   Procedure: Plication of Left Arm Arteriovenous Fistula;  Surgeon: Elam Dutch, MD;  Location: [place];  Service: Vascular;  Laterality: Left;  . POLYPECTOMY    . POLYPECTOMY  01/25/2018   Procedure: POLYPECTOMY;  Surgeon: Jerene Bears, MD;  Location: [place];  Service: Gastroenterology;;  . REVISON OF ARTERIOVENOUS FISTULA Left 0/93/2355   Procedure: PLICATION OF DISTAL ANEURYSMAL SEGEMENT OF LEFT UPPER ARM ARTERIOVENOUS FISTULA;  Surgeon: Elam Dutch, MD;  Location: [place];  Service: Vascular;  Laterality: Left;  . REVISON OF ARTERIOVENOUS FISTULA Left 7/32/2025   Procedure: Plication of Left Upper Arm Fistula ;  Surgeon: Waynetta Sandy, MD;  Location: [place];  Service: Vascular;  Laterality: Left;  . SKIN GRAFT SPLIT THICKNESS LEG / FOOT Left    SKIN GRAFT SPLIT THICKNESS LEFT ARM DONOR SITE: LEFT ANTERIOR THIGH  . SKIN SPLIT GRAFT Left 07/04/2017   Procedure: SKIN GRAFT SPLIT THICKNESS LEFT ARM DONOR SITE: LEFT ANTERIOR THIGH;  Surgeon: Elam Dutch, MD;  Location: [place];  Service: Vascular;  Laterality: Left;  . THROMBECTOMY W/ EMBOLECTOMY Left 06/05/2017   Procedure: EXPLORATION OF LEFT ARM FOR BLEEDING; OVERSEWED PROXIMAL FISTULA;  Surgeon: Angelia Mould, MD;  Location: [place];  Service: Vascular;  Laterality: Left;  . WOUND EXPLORATION Left 06/03/2017    Procedure: WOUND EXPLORATION WITH WOUND VAC APPLICATION TO LEFT ARM;  Surgeon: Angelia Mould, MD;  Location: [facility] OR;  Service: Vascular;  Laterality: Left;       Family History  Problem Relation Age of Onset  . Heart disease Mother   . Lung cancer Mother   . Heart disease Father   . Malignant hyperthermia Father   . COPD Father   . Throat cancer Sister   . Esophageal cancer Sister   . Hypertension Other   . COPD Other   . Colon cancer Neg Hx   . Colon polyps Neg Hx   . Rectal cancer Neg Hx   . Stomach cancer Neg Hx     Social History   Tobacco Use  . Smoking status: Current Every Day Smoker    Packs/day: 0.50    Years: 43.00    Pack years: 21.50    Types: Cigarettes    Start date: 08/13/1973  . Smokeless tobacco: Never Used  . Tobacco comment: i dont know i just make   Vaping Use  . Vaping Use: Never used  Substance Use Topics  . Alcohol use: Not Currently    Comment: quit drinking in 2017  . Drug use: Not Currently    Types: Marijuana, Cocaine    Comment: reports using once every 3 months, 04-06-2019 was this     Home Medications Prior to Admission medications   Medication Sig Start Date End Date Taking? Authorizing Provider  gabapentin (NEURONTIN) 300 MG capsule Take 1 capsule (300 mg total) by mouth 2 (two) times daily. 01/23/20  Yes Adrian Prows, MD  Oxycodone HCl 10 MG TABS Take 10 mg by mouth 3 (three) times daily as needed. 01/24/20  Yes [provider]  tenofovir (VIREAD) 300 MG tablet Take 300 mg by mouth once a week. 01/03/20  Yes [provider]    Allergies    Tramadol, Morphine and related, Pollen extract, Acetaminophen, Aspirin, and Clonidine derivatives  Review of Systems   Review of Systems  All other systems reviewed and are  negative.   Physical Exam Updated Vital Signs BP 92/71   Pulse (!) 143   Temp 98.6 F (37 C) (Oral)   Resp (!) 21   SpO2 (!) 89%   Physical Exam Vitals and nursing note reviewed.    Constitutional:      General: He is not in acute distress.    Appearance: He is well-developed. He is ill-appearing.     Comments: Cachectic  HENT:     Head: Normocephalic and atraumatic.  Eyes:     Conjunctiva/sclera: Conjunctivae normal.     Pupils: Pupils are equal, round, and reactive to light.  Cardiovascular:     Rate and Rhythm: Regular rhythm. Tachycardia present.     Heart sounds: No murmur heard.   Pulmonary:     Effort: Pulmonary effort is normal. Tachypnea present. No respiratory distress.     Breath sounds: Wheezing present. No rales.  Abdominal:     General: There is distension.     Palpations: Abdomen is soft.     Tenderness: There is no abdominal tenderness. There is no guarding or rebound.     Comments: Healing paracentesis puncture in the left lower abdomen.  Fluid wave is present  Musculoskeletal:        General: No tenderness. Normal range of motion.     Cervical back: Normal range of motion and neck supple.     Right lower leg: No edema.     Left lower leg: No edema.  Skin:    General: Skin is warm and dry.     Findings: No erythema or rash.  Neurological:     General: No focal deficit present.     Mental Status: He is alert and oriented to person, place, and time. Mental status is at baseline.  Psychiatric:        Mood and Affect: Mood normal.        Behavior: Behavior normal.        Thought Content: Thought content normal.     ED Results / Procedures / Treatments   Labs (all labs ordered are listed, but only abnormal results are displayed) Labs Reviewed  CBC WITH DIFFERENTIAL/PLATELET - Abnormal; Notable for the following components:      Result Value   WBC 12.0 (*)    Hemoglobin 12.2 (*)    HCT 35.6 (*)    RDW 15.8 (*)    Neutro Abs 8.8 (*)    Monocytes Absolute 1.5 (*)    Abs Immature Granulocytes 0.10 (*)    All other components within normal limits  COMPREHENSIVE METABOLIC PANEL - Abnormal; Notable for the following components:    Sodium 133 (*)    Potassium 5.9 (*)    Chloride 89 (*)    Glucose, Bld 102 (*)    BUN 28 (*)    Creatinine, Ser 3.94 (*)    Albumin 2.6 (*)    AST 73 (*)    ALT 64 (*)    Alkaline Phosphatase 364 (*)    GFR calc non Af Amer 16 (*)    GFR calc Af Amer 18 (*)    All other components within normal limits  POTASSIUM    EKG None  ED ECG REPORT   Date: 01/25/2020  Rate: 77  Rhythm: atrial or sinus rhytm  QRS Axis: normal  Intervals: normal  ST/T Wave abnormalities: nonspecific ST/T changes  Conduction Disutrbances:left bundle branch block  Narrative Interpretation:   Old EKG Reviewed: unchanged  I have personally reviewed  the EKG tracing and agree with the computerized printout as noted.   Radiology DG Chest Port 1 View  Result Date: 01/25/2020 CLINICAL DATA:  Shortness of breath, end-stage renal disease, hepatitis, cirrhosis EXAM: PORTABLE CHEST 1 VIEW COMPARISON:  01/20/2020 FINDINGS: Single frontal view of the chest demonstrates persistent enlargement of the cardiac silhouette. Diffuse increased interstitial prominence since prior study likely reflects interstitial edema. No airspace disease, effusion, or pneumothorax. No acute bony abnormalities. IMPRESSION: 1. Increased interstitial prominence compatible with interstitial edema. Electronically Signed   By: Randa Ngo M.D.   On: 01/25/2020 22:52   IR Paracentesis  Result Date: 01/24/2020 INDICATION: End-stage renal disease, cirrhosis, hepatitis B and C, recurrent ascites. Request for therapeutic paracentesis EXAM: ULTRASOUND GUIDED PARACENTESIS MEDICATIONS: 1% lidocaine 15 mL COMPLICATIONS: None immediate. PROCEDURE: Informed written consent was obtained from the patient after a discussion of the risks, benefits and alternatives to treatment. A timeout was performed prior to the initiation of the procedure. Initial ultrasound scanning demonstrates a moderate amount of ascites within the left lower abdominal quadrant. The  left lower abdomen was prepped and draped in the usual sterile fashion. 1% lidocaine was used for local anesthesia. Following this, a 6 Fr Safe-T-Centesis catheter was introduced. An ultrasound image was saved for documentation purposes. The paracentesis was performed. The catheter was removed and a dressing was applied. The patient tolerated the procedure well without immediate post procedural complication. FINDINGS: A total of approximately 1.4 L of amber fluid was removed. IMPRESSION: Successful ultrasound-guided paracentesis yielding 1.4 liters of peritoneal fluid. Read by: Gareth Eagle, PA-C Electronically Signed   By: Aletta Edouard M.D.   On: 01/24/2020 14:49    Procedures Procedures (including critical care time)  Medications Ordered in ED Medications  albuterol (VENTOLIN HFA) 108 (90 Base) MCG/ACT inhaler 2 puff (2 puffs Inhalation Given 01/25/20 2226)  ipratropium (ATROVENT HFA) inhaler 2 puff (2 puffs Inhalation Given 01/25/20 2225)  HYDROmorphone (DILAUDID) injection 0.5 mg (0.5 mg Intravenous Given 01/25/20 2224)    ED Course  I have reviewed the triage vital signs and the nursing notes.  Pertinent labs & imaging results that were available during my care of the patient were reviewed by me and considered in my medical decision making (see chart for details).    MDM Rules/Calculators/A&P                          59 year old male well-known to the emergency room with multiple medical problems presenting today with shortness of breath.  Patient has history of cirrhosis requiring frequent paracentesis which last had fluid removed yesterday as well as history of noncompliance with dialysis but reported he went to dialysis today and completed a full course.  It is unknown when patient shortness of breath started as this seems to be a chronic problem and he is seen frequently for this but he did report it worsened today.  He is also had an intermittent productive cough but denies any fever.   Patient has had his first dose of the Covid vaccine but has not received the second.  He is wheezing on exam and initially EMS reported patient was in A. fib RVR with intermittent runs of V. tach.  However patient here appears to be in A. fib RVR and then upon arrival here during exam converted to a slower rhythm with heart rates in the 70s.  Patient's oxygen saturation was 89% on room air and he was placed on oxygen.  Given patient has had a full course of dialysis today and removal from paracentesis could be since his COPD being exacerbated by weather as well as ongoing tobacco use.  Patient's chest x-ray does show worsening interstitial prominence this could be fluid overload.  CBC with minimal leukocytosis of 12,000 improved from prior, CMP is pending.  Patient given Solu-Medrol, magnesium and albuterol and Atrovent.  Will reevaluate.  Patient placed on oxygen.  11:33 PM Pt's heart rate continues to fluctuate between 77-120.  CMP with K of 5.9 with mild hemolysis and repeat was ordered.  Will need repeat evaluation after inhalers to ensure he can tolerate off oxygen or may need admission for COPD exacerbation.   Final Clinical Impression(s) / ED Diagnoses Final diagnoses:  None    Rx / DC Orders ED Discharge Orders    None       Blanchie Dessert, MD 01/25/20 2336

## 2020-01-26 DIAGNOSIS — D72829 Elevated white blood cell count, unspecified: Secondary | ICD-10-CM | POA: Diagnosis not present

## 2020-01-26 DIAGNOSIS — Z885 Allergy status to narcotic agent status: Secondary | ICD-10-CM | POA: Diagnosis not present

## 2020-01-26 DIAGNOSIS — Z66 Do not resuscitate: Secondary | ICD-10-CM | POA: Diagnosis present

## 2020-01-26 DIAGNOSIS — I4891 Unspecified atrial fibrillation: Secondary | ICD-10-CM | POA: Diagnosis not present

## 2020-01-26 DIAGNOSIS — J9621 Acute and chronic respiratory failure with hypoxia: Secondary | ICD-10-CM | POA: Diagnosis present

## 2020-01-26 DIAGNOSIS — J9601 Acute respiratory failure with hypoxia: Secondary | ICD-10-CM

## 2020-01-26 DIAGNOSIS — Z20822 Contact with and (suspected) exposure to covid-19: Secondary | ICD-10-CM | POA: Diagnosis present

## 2020-01-26 DIAGNOSIS — J811 Chronic pulmonary edema: Secondary | ICD-10-CM | POA: Diagnosis present

## 2020-01-26 DIAGNOSIS — Z992 Dependence on renal dialysis: Secondary | ICD-10-CM | POA: Diagnosis not present

## 2020-01-26 DIAGNOSIS — F1010 Alcohol abuse, uncomplicated: Secondary | ICD-10-CM | POA: Diagnosis present

## 2020-01-26 DIAGNOSIS — E875 Hyperkalemia: Secondary | ICD-10-CM | POA: Diagnosis present

## 2020-01-26 DIAGNOSIS — R64 Cachexia: Secondary | ICD-10-CM | POA: Diagnosis present

## 2020-01-26 DIAGNOSIS — J441 Chronic obstructive pulmonary disease with (acute) exacerbation: Secondary | ICD-10-CM | POA: Diagnosis present

## 2020-01-26 DIAGNOSIS — N186 End stage renal disease: Secondary | ICD-10-CM | POA: Diagnosis present

## 2020-01-26 DIAGNOSIS — K7031 Alcoholic cirrhosis of liver with ascites: Secondary | ICD-10-CM | POA: Diagnosis present

## 2020-01-26 DIAGNOSIS — N2581 Secondary hyperparathyroidism of renal origin: Secondary | ICD-10-CM | POA: Diagnosis present

## 2020-01-26 DIAGNOSIS — Z9114 Patient's other noncompliance with medication regimen: Secondary | ICD-10-CM | POA: Diagnosis not present

## 2020-01-26 DIAGNOSIS — K219 Gastro-esophageal reflux disease without esophagitis: Secondary | ICD-10-CM | POA: Diagnosis present

## 2020-01-26 DIAGNOSIS — T380X5A Adverse effect of glucocorticoids and synthetic analogues, initial encounter: Secondary | ICD-10-CM | POA: Diagnosis not present

## 2020-01-26 DIAGNOSIS — B181 Chronic viral hepatitis B without delta-agent: Secondary | ICD-10-CM | POA: Diagnosis present

## 2020-01-26 DIAGNOSIS — I484 Atypical atrial flutter: Secondary | ICD-10-CM | POA: Diagnosis present

## 2020-01-26 DIAGNOSIS — I1311 Hypertensive heart and chronic kidney disease without heart failure, with stage 5 chronic kidney disease, or end stage renal disease: Secondary | ICD-10-CM | POA: Diagnosis present

## 2020-01-26 DIAGNOSIS — I4819 Other persistent atrial fibrillation: Secondary | ICD-10-CM | POA: Diagnosis present

## 2020-01-26 DIAGNOSIS — F17210 Nicotine dependence, cigarettes, uncomplicated: Secondary | ICD-10-CM | POA: Diagnosis present

## 2020-01-26 DIAGNOSIS — B182 Chronic viral hepatitis C: Secondary | ICD-10-CM | POA: Diagnosis present

## 2020-01-26 DIAGNOSIS — I472 Ventricular tachycardia: Secondary | ICD-10-CM | POA: Diagnosis present

## 2020-01-26 LAB — POC SARS CORONAVIRUS 2 AG -  ED: SARS Coronavirus 2 Ag: NEGATIVE

## 2020-01-26 LAB — SARS CORONAVIRUS 2 BY RT PCR (HOSPITAL ORDER, PERFORMED IN ~~LOC~~ HOSPITAL LAB): SARS Coronavirus 2: NEGATIVE

## 2020-01-26 LAB — PHOSPHORUS: Phosphorus: 6.8 mg/dL — ABNORMAL HIGH (ref 2.5–4.6)

## 2020-01-26 LAB — GLUCOSE, CAPILLARY
Glucose-Capillary: 113 mg/dL — ABNORMAL HIGH (ref 70–99)
Glucose-Capillary: 160 mg/dL — ABNORMAL HIGH (ref 70–99)
Glucose-Capillary: 186 mg/dL — ABNORMAL HIGH (ref 70–99)

## 2020-01-26 LAB — HEMOGLOBIN A1C
Hgb A1c MFr Bld: 4.8 % (ref 4.8–5.6)
Mean Plasma Glucose: 91.06 mg/dL

## 2020-01-26 LAB — APTT: aPTT: 37 seconds — ABNORMAL HIGH (ref 24–36)

## 2020-01-26 LAB — PROTIME-INR
INR: 1.1 (ref 0.8–1.2)
Prothrombin Time: 13.6 seconds (ref 11.4–15.2)

## 2020-01-26 LAB — MAGNESIUM: Magnesium: 2.8 mg/dL — ABNORMAL HIGH (ref 1.7–2.4)

## 2020-01-26 LAB — CBG MONITORING, ED: Glucose-Capillary: 152 mg/dL — ABNORMAL HIGH (ref 70–99)

## 2020-01-26 LAB — POTASSIUM: Potassium: 5.2 mmol/L — ABNORMAL HIGH (ref 3.5–5.1)

## 2020-01-26 MED ORDER — SODIUM ZIRCONIUM CYCLOSILICATE 10 G PO PACK
10.0000 g | PACK | Freq: Once | ORAL | Status: AC
  Administered 2020-01-26: 10 g via ORAL
  Filled 2020-01-26: qty 1

## 2020-01-26 MED ORDER — IPRATROPIUM-ALBUTEROL 0.5-2.5 (3) MG/3ML IN SOLN
3.0000 mL | Freq: Once | RESPIRATORY_TRACT | Status: DC
  Filled 2020-01-26: qty 3

## 2020-01-26 MED ORDER — GABAPENTIN 100 MG PO CAPS
100.0000 mg | ORAL_CAPSULE | Freq: Two times a day (BID) | ORAL | Status: DC
  Administered 2020-01-26 – 2020-01-28 (×5): 100 mg via ORAL
  Filled 2020-01-26 (×5): qty 1

## 2020-01-26 MED ORDER — DILTIAZEM HCL-DEXTROSE 125-5 MG/125ML-% IV SOLN (PREMIX)
5.0000 mg/h | INTRAVENOUS | Status: AC
  Administered 2020-01-26: 5 mg/h via INTRAVENOUS
  Administered 2020-01-26: 12.5 mg/h via INTRAVENOUS
  Administered 2020-01-27: 7.5 mg/h via INTRAVENOUS
  Filled 2020-01-26 (×3): qty 125

## 2020-01-26 MED ORDER — AMIODARONE HCL IN DEXTROSE 360-4.14 MG/200ML-% IV SOLN
30.0000 mg/h | INTRAVENOUS | Status: DC
  Administered 2020-01-26: 30 mg/h via INTRAVENOUS
  Filled 2020-01-26: qty 200

## 2020-01-26 MED ORDER — HYDROMORPHONE HCL 1 MG/ML IJ SOLN
0.5000 mg | INTRAMUSCULAR | Status: DC | PRN
  Administered 2020-01-26 – 2020-01-29 (×14): 0.5 mg via INTRAVENOUS
  Filled 2020-01-26 (×16): qty 1

## 2020-01-26 MED ORDER — SODIUM CHLORIDE 0.9 % IV SOLN
100.0000 mg | Freq: Two times a day (BID) | INTRAVENOUS | Status: DC
  Administered 2020-01-26 (×2): 100 mg via INTRAVENOUS
  Filled 2020-01-26 (×4): qty 100

## 2020-01-26 MED ORDER — INSULIN ASPART 100 UNIT/ML ~~LOC~~ SOLN
0.0000 [IU] | SUBCUTANEOUS | Status: DC
  Administered 2020-01-26 – 2020-01-28 (×8): 1 [IU] via SUBCUTANEOUS

## 2020-01-26 MED ORDER — ZOLPIDEM TARTRATE 5 MG PO TABS
5.0000 mg | ORAL_TABLET | Freq: Once | ORAL | Status: AC
  Administered 2020-01-26: 5 mg via ORAL
  Filled 2020-01-26: qty 1

## 2020-01-26 MED ORDER — DM-GUAIFENESIN ER 30-600 MG PO TB12
1.0000 | ORAL_TABLET | Freq: Two times a day (BID) | ORAL | Status: DC
  Administered 2020-01-26: 1 via ORAL
  Filled 2020-01-26 (×2): qty 1

## 2020-01-26 MED ORDER — ALBUTEROL SULFATE (2.5 MG/3ML) 0.083% IN NEBU: 2.5000 mg | INHALATION_SOLUTION | RESPIRATORY_TRACT | Status: DC | PRN

## 2020-01-26 MED ORDER — ALBUTEROL SULFATE HFA 108 (90 BASE) MCG/ACT IN AERS
4.0000 | INHALATION_SPRAY | Freq: Once | RESPIRATORY_TRACT | Status: AC
  Administered 2020-01-26: 4 via RESPIRATORY_TRACT

## 2020-01-26 MED ORDER — LORAZEPAM 0.5 MG PO TABS
0.5000 mg | ORAL_TABLET | Freq: Four times a day (QID) | ORAL | Status: DC | PRN
  Administered 2020-01-26 – 2020-01-28 (×6): 0.5 mg via ORAL
  Filled 2020-01-26 (×6): qty 1

## 2020-01-26 MED ORDER — HEPARIN SODIUM (PORCINE) 5000 UNIT/ML IJ SOLN
5000.0000 [IU] | Freq: Three times a day (TID) | INTRAMUSCULAR | Status: DC
  Filled 2020-01-26 (×4): qty 1

## 2020-01-26 MED ORDER — IPRATROPIUM-ALBUTEROL 0.5-2.5 (3) MG/3ML IN SOLN: 3.0000 mL | Freq: Four times a day (QID) | RESPIRATORY_TRACT | Status: DC | PRN

## 2020-01-26 MED ORDER — AMIODARONE HCL IN DEXTROSE 360-4.14 MG/200ML-% IV SOLN
30.0000 mg/h | INTRAVENOUS | Status: DC
  Filled 2020-01-26: qty 200

## 2020-01-26 MED ORDER — METOPROLOL TARTRATE 5 MG/5ML IV SOLN
2.5000 mg | Freq: Once | INTRAVENOUS | Status: DC
  Filled 2020-01-26: qty 5

## 2020-01-26 MED ORDER — METHYLPREDNISOLONE SODIUM SUCC 125 MG IJ SOLR
60.0000 mg | Freq: Three times a day (TID) | INTRAMUSCULAR | Status: DC
  Administered 2020-01-26 – 2020-01-27 (×4): 60 mg via INTRAVENOUS
  Filled 2020-01-26 (×4): qty 2

## 2020-01-26 MED ORDER — GUAIFENESIN-DM 100-10 MG/5ML PO SYRP
5.0000 mL | ORAL_SOLUTION | ORAL | Status: DC | PRN
  Administered 2020-01-26 – 2020-01-28 (×6): 5 mL via ORAL
  Filled 2020-01-26 (×7): qty 5

## 2020-01-26 MED ORDER — DIPHENHYDRAMINE HCL 50 MG/ML IJ SOLN
25.0000 mg | Freq: Once | INTRAMUSCULAR | Status: AC
  Administered 2020-01-26: 25 mg via INTRAVENOUS
  Filled 2020-01-26: qty 1

## 2020-01-26 NOTE — H&P (Signed)
[facility] History and Physical  Bashir Marchetti QMG:[id number] DOB: Dec 12, 1960 DOA: 01/25/2020  Referring EDP: Roxanne Mins PCP: [name], FNP   Chief Complaint: Elevated heart rate; SOB  HPI: [name] is a 59 y.o. male with PMH of ESRD, CAD, PAF, cirrhosis, hep B & C, EtOH abuse, COPD, and tobacco abuse presented to ED with SOB and tachycardia and admitted for COPD exacerbation and a flutter with RVR.  Patient reports presenting today for increased shortness of breath as well as elevated heart rate. Reports SOB has been worsening over past several days with increased cough and sputum production. He came in today mainly because he noticed his elevated heart rate. Reports SOB worsens with smoking. Reports dialysis yesterday. He has been using inhalers at home but unsure if using correctly. Patient requests O2 in his home. Reports some abdominal pain when he uses the restroom but otherwise denies complaints. He is reluctant to stay but does not want to sign out AMA. Denies headache, dizziness, fever, chills, chest pain, nausea, vomiting, diarrhea, constipation, dysuria, hematuria, hematochezia, melena, difficulty moving arms/legs, speech difficulty, trouble eating, confusion or any other complaints.  In the ED: Tachycardic into 150's with hypotension. Currently on non-rebreather. Afebrile. Labs remarkable for WBC 12.0, Hgb 12.2, Na 133, glucose 102, Cr 3.94, AST 73, ALT 64, Alk phos 364, K 5.2.  CXR: Increased interstitial prominence compatible with interstitial edema.  Patient was placed on non-rebreather and given Albuterol, Benadryl, Dilaudid, Atrovent, Solumedrol and IV Mag. Admission requested for respiratory failure and tachycardia with hypotension.  Spoke with Cardiology on admission and they recommended Amiodarone drip and will consult.   Review of Systems:  All other systems negative unless noted above in HPI.   Past Medical History:  Diagnosis Date  .  Anemia   . Anxiety   . Arthritis    left shoulder  . Atherosclerosis of aorta ([place])   . Cardiomegaly   . Chest pain    DATE UNKNOWN, C/O PERIODICALLY  . Cocaine abuse ([place])   . COPD exacerbation ([place]) 08/17/2016  . Coronary artery disease    stent 02/22/17  . ESRD (end stage renal disease) on dialysis ([place])    "[name]; MWF" (07/04/2017)  . GERD (gastroesophageal reflux disease)    DATE UNKNOWN  . Hemorrhoids   . Hepatitis B, chronic ([place])   . Hepatitis C   . History of kidney stones   . Hyperkalemia   . Hypertension   . Kidney failure   . Metabolic bone disease    Patient denies  . Mitral stenosis   . Myocardial infarction ([place])   . Pneumonia   . Pulmonary edema   . Solitary rectal ulcer syndrome 07/2017   at flex sig for rectal bleeding  . Tubular adenoma of colon    Past Surgical History:  Procedure Laterality Date  . A/V FISTULAGRAM Left 05/26/2017   Procedure: A/V FISTULAGRAM;  Surgeon: Conrad Mono Vista, MD;  Location: [place] [facility];  Service: Cardiovascular;  Laterality: Left;  . A/V FISTULAGRAM Right 11/18/2017   Procedure: A/V FISTULAGRAM - Right Arm;  Surgeon: Elam Dutch, MD;  Location: [place] [facility];  Service: Cardiovascular;  Laterality: Right;  . APPLICATION OF WOUND VAC Left 06/14/2017   Procedure: APPLICATION OF WOUND VAC;  Surgeon: Katha Cabal, MD;  Location: [facility];  Service: Vascular;  Laterality: Left;  . AV FISTULA PLACEMENT  2012   BELIEVED WAS PLACED IN JUNE  . AV FISTULA  PLACEMENT Right 08/09/2017   Procedure: Creation Right arm ARTERIOVENOUS BRACHIOCEPOHALIC FISTULA;  Surgeon: Elam Dutch, MD;  Location: [facility];  Service: Vascular;  Laterality: Right;  . AV FISTULA PLACEMENT Right 11/22/2017   Procedure: INSERTION OF ARTERIOVENOUS (AV) GORE-TEX GRAFT RIGHT UPPER ARM;  Surgeon: Elam Dutch, MD;  Location: [place];  Service: Vascular;  Laterality: Right;  . BIOPSY  01/25/2018   Procedure: BIOPSY;  Surgeon: Jerene Bears,  MD;  Location: [place];  Service: Gastroenterology;;  . BIOPSY  04/10/2019   Procedure: BIOPSY;  Surgeon: Jerene Bears, MD;  Location: [facility];  Service: Gastroenterology;;  . COLONOSCOPY    . COLONOSCOPY WITH PROPOFOL N/A 01/25/2018   Procedure: COLONOSCOPY WITH PROPOFOL;  Surgeon: Jerene Bears, MD;  Location: [place];  Service: Gastroenterology;  Laterality: N/A;  . CORONARY STENT INTERVENTION N/A 02/22/2017   Procedure: CORONARY STENT INTERVENTION;  Surgeon: Nigel Mormon, MD;  Location: [place] [facility];  Service: Cardiovascular;  Laterality: N/A;  . ESOPHAGOGASTRODUODENOSCOPY (EGD) WITH PROPOFOL N/A 01/25/2018   Procedure: ESOPHAGOGASTRODUODENOSCOPY (EGD) WITH PROPOFOL;  Surgeon: Jerene Bears, MD;  Location: [place];  Service: Gastroenterology;  Laterality: N/A;  . ESOPHAGOGASTRODUODENOSCOPY (EGD) WITH PROPOFOL N/A 04/10/2019   Procedure: ESOPHAGOGASTRODUODENOSCOPY (EGD) WITH PROPOFOL;  Surgeon: Jerene Bears, MD;  Location: [facility];  Service: Gastroenterology;  Laterality: N/A;  . FLEXIBLE SIGMOIDOSCOPY N/A 07/15/2017   Procedure: FLEXIBLE SIGMOIDOSCOPY;  Surgeon: Carol Ada, MD;  Location: [place];  Service: Endoscopy;  Laterality: N/A;  . HEMORRHOID BANDING    . I & D EXTREMITY Left 06/01/2017   Procedure: IRRIGATION AND DEBRIDEMENT LEFT ARM HEMATOMA WITH LIGATION OF LEFT ARM AV FISTULA;  Surgeon: Elam Dutch, MD;  Location: [place];  Service: Vascular;  Laterality: Left;  . I & D EXTREMITY Left 06/14/2017   Procedure: IRRIGATION AND DEBRIDEMENT EXTREMITY;  Surgeon: Katha Cabal, MD;  Location: [facility];  Service: Vascular;  Laterality: Left;  . INSERTION OF DIALYSIS CATHETER  05/30/2017  . INSERTION OF DIALYSIS CATHETER N/A 05/30/2017   Procedure: INSERTION OF DIALYSIS CATHETER;  Surgeon: Elam Dutch, MD;  Location: [place];  Service: Vascular;  Laterality: N/A;  . IR PARACENTESIS  08/30/2017  . IR PARACENTESIS  09/29/2017  . IR  PARACENTESIS  10/28/2017  . IR PARACENTESIS  11/09/2017  . IR PARACENTESIS  11/16/2017  . IR PARACENTESIS  11/28/2017  . IR PARACENTESIS  12/01/2017  . IR PARACENTESIS  12/06/2017  . IR PARACENTESIS  01/03/2018  . IR PARACENTESIS  01/23/2018  . IR PARACENTESIS  02/07/2018  . IR PARACENTESIS  02/21/2018  . IR PARACENTESIS  03/06/2018  . IR PARACENTESIS  03/17/2018  . IR PARACENTESIS  04/04/2018  . IR PARACENTESIS  12/28/2018  . IR PARACENTESIS  01/08/2019  . IR PARACENTESIS  01/23/2019  . IR PARACENTESIS  02/01/2019  . IR PARACENTESIS  02/19/2019  . IR PARACENTESIS  03/01/2019  . IR PARACENTESIS  03/15/2019  . IR PARACENTESIS  04/03/2019  . IR PARACENTESIS  04/12/2019  . IR PARACENTESIS  05/01/2019  . IR PARACENTESIS  05/08/2019  . IR PARACENTESIS  05/24/2019  . IR PARACENTESIS  06/12/2019  . IR PARACENTESIS  07/09/2019  . IR PARACENTESIS  07/27/2019  . IR PARACENTESIS  08/09/2019  . IR PARACENTESIS  08/21/2019  . IR PARACENTESIS  09/17/2019  . IR PARACENTESIS  10/05/2019  . IR PARACENTESIS  10/29/2019  . IR PARACENTESIS  11/08/2019  . IR PARACENTESIS  12/12/2019  .  IR PARACENTESIS  01/03/2020  . IR PARACENTESIS  01/10/2020  . IR PARACENTESIS  01/17/2020  . IR PARACENTESIS  01/24/2020  . IR RADIOLOGIST EVAL & MGMT  02/14/2018  . IR RADIOLOGIST EVAL & MGMT  02/22/2019  . LEFT HEART CATH AND CORONARY ANGIOGRAPHY N/A 02/22/2017   Procedure: LEFT HEART CATH AND CORONARY ANGIOGRAPHY;  Surgeon: Nigel Mormon, MD;  Location: [place] [facility];  Service: Cardiovascular;  Laterality: N/A;  . LIGATION OF ARTERIOVENOUS  FISTULA Left 12/17/3417   Procedure: Plication of Left Arm Arteriovenous Fistula;  Surgeon: Elam Dutch, MD;  Location: [place];  Service: Vascular;  Laterality: Left;  . POLYPECTOMY    . POLYPECTOMY  01/25/2018   Procedure: POLYPECTOMY;  Surgeon: Jerene Bears, MD;  Location: [place];  Service: Gastroenterology;;  . REVISON OF ARTERIOVENOUS FISTULA Left 12/31/2977   Procedure: PLICATION OF DISTAL  ANEURYSMAL SEGEMENT OF LEFT UPPER ARM ARTERIOVENOUS FISTULA;  Surgeon: Elam Dutch, MD;  Location: [place];  Service: Vascular;  Laterality: Left;  . REVISON OF ARTERIOVENOUS FISTULA Left 8/92/1194   Procedure: Plication of Left Upper Arm Fistula ;  Surgeon: Waynetta Sandy, MD;  Location: [place];  Service: Vascular;  Laterality: Left;  . SKIN GRAFT SPLIT THICKNESS LEG / FOOT Left    SKIN GRAFT SPLIT THICKNESS LEFT ARM DONOR SITE: LEFT ANTERIOR THIGH  . SKIN SPLIT GRAFT Left 07/04/2017   Procedure: SKIN GRAFT SPLIT THICKNESS LEFT ARM DONOR SITE: LEFT ANTERIOR THIGH;  Surgeon: Elam Dutch, MD;  Location: [place];  Service: Vascular;  Laterality: Left;  . THROMBECTOMY W/ EMBOLECTOMY Left 06/05/2017   Procedure: EXPLORATION OF LEFT ARM FOR BLEEDING; OVERSEWED PROXIMAL FISTULA;  Surgeon: Angelia Mould, MD;  Location: [place];  Service: Vascular;  Laterality: Left;  . WOUND EXPLORATION Left 06/03/2017   Procedure: WOUND EXPLORATION WITH WOUND VAC APPLICATION TO LEFT ARM;  Surgeon: Angelia Mould, MD;  Location: [place];  Service: Vascular;  Laterality: Left;   Social History:  reports that he has been smoking cigarettes. He started smoking about 46 years ago. He has a 21.50 pack-year smoking history. He has never used smokeless tobacco. He reports previous alcohol use. He reports previous drug use. Drugs: Marijuana and Cocaine.  Allergies  Allergen Reactions  . Tramadol Itching and Other (See Comments)  . Morphine And Related Other (See Comments)    Stomach pain  . Pollen Extract     Other reaction(s): Sneezing (finding)  . Acetaminophen Nausea Only    Stomach ache Other reaction(s): Other (See Comments) Stomach ache  . Aspirin Other (See Comments) and Itching    STOMACH PAIN Other reaction(s): Unknown STOMACH PAIN  . Clonidine Derivatives Itching    Family History  Problem Relation Age of Onset  . Heart disease Mother   . Lung cancer Mother   . Heart  disease Father   . Malignant hyperthermia Father   . COPD Father   . Throat cancer Sister   . Esophageal cancer Sister   . Hypertension Other   . COPD Other   . Colon cancer Neg Hx   . Colon polyps Neg Hx   . Rectal cancer Neg Hx   . Stomach cancer Neg Hx     Prior to Admission medications   Medication Sig Start Date End Date Taking? Authorizing Provider  gabapentin (NEURONTIN) 300 MG capsule Take 1 capsule (300 mg total) by mouth 2 (two) times daily. 01/23/20  Yes Adrian Prows, MD  Oxycodone  HCl 10 MG TABS Take 10 mg by mouth 3 (three) times daily as needed. 01/24/20  Yes [provider]  tenofovir (VIREAD) 300 MG tablet Take 300 mg by mouth once a week. 01/03/20  Yes [provider]   Physical Exam: Vitals:   01/26/20 0553 01/26/20 0555 01/26/20 0601 01/26/20 0617  BP:      Pulse: (!) 157 (!) 155 (!) 157 (!) 154  Resp: 20 20 17 18   Temp:      TempSrc:      SpO2: 100% 99% 100% 99%    Wt Readings from Last 3 Encounters:  01/19/20 61.2 kg  01/14/20 61.2 kg  01/14/20 53.4 kg    . General:  Resting in bed. Appears ill and cachectic. AAOx4.  . Eyes: EOMI, normal lids, irises & conjunctiva . ENT: grossly normal hearing, lips & tongue . Neck: normal ROM . Cardiovascular: RRR, no m/r/g. No LE edema. Marland Kitchen Respiratory: Bilateral expiratory wheezes, non-rebreather in place and patient with increased WOB . Abdomen: soft with distention, mild generalized TTP . Skin: no rash or induration seen on limited exam . Musculoskeletal: grossly normal tone BUE/BLE . Psychiatric: grossly normal mood and affect, speech fluent and appropriate . Neurologic: grossly non-focal, CN2-12 intact           Labs on Admission:  Basic Metabolic Panel: Recent Labs  Lab 01/19/20 0838 01/19/20 0838 01/19/20 1728 01/20/20 0044 01/20/20 1543 01/25/20 2218 01/26/20 0221  NA 131*  --  130* 134* 132* 133*  --   K 6.5*   < > 7.3* 3.8 4.5 5.9* 5.2*  CL 90*  --  87* 93* 89* 89*  --   CO2  28  --  27 28 27 29   --   GLUCOSE 97  --  182* 170* 171* 102*  --   BUN 31*  --  38* 15 36* 28*  --   CREATININE 4.38*  --  4.99* 2.61* 4.36* 3.94*  --   CALCIUM 9.3  --  9.6 9.2 9.5 9.5  --   MG  --   --   --  2.4  --   --   --   PHOS  --   --   --  4.1  --   --   --    < > = values in this interval not displayed.   Liver Function Tests: Recent Labs  Lab 01/19/20 0838 01/20/20 0044 01/25/20 2218  AST 108*  --  73*  ALT 81*  --  64*  ALKPHOS 421*  --  364*  BILITOT 1.0  --  1.0  PROT 6.8  --  6.5  ALBUMIN 2.5* 2.7* 2.6*   No results for input(s): LIPASE, AMYLASE in the last 168 hours. No results for input(s): AMMONIA in the last 168 hours. CBC: Recent Labs  Lab 01/19/20 0838 01/20/20 0044 01/20/20 1543 01/25/20 2218  WBC 12.7* 11.4* 15.6* 12.0*  NEUTROABS 10.3*  --   --  8.8*  HGB 11.4* 11.5* 11.6* 12.2*  HCT 33.3* 33.6* 34.0* 35.6*  MCV 80.0 80.4 80.4 80.0  PLT 178 192 210 232   Cardiac Enzymes: No results for input(s): CKTOTAL, CKMB, CKMBINDEX, TROPONINI in the last 168 hours.  BNP (last 3 results) Recent Labs    10/06/19 1159 11/12/19 2341 01/14/20 1927  BNP 2,690.1* 3,672.9* 2,254.1*    ProBNP (last 3 results) No results for input(s): PROBNP in the last 8760 hours.  CBG: No results for input(s): GLUCAP in  the last 168 hours.  Radiological Exams on Admission: DG Chest Port 1 View  Result Date: 01/25/2020 CLINICAL DATA:  Shortness of breath, end-stage renal disease, hepatitis, cirrhosis EXAM: PORTABLE CHEST 1 VIEW COMPARISON:  01/20/2020 FINDINGS: Single frontal view of the chest demonstrates persistent enlargement of the cardiac silhouette. Diffuse increased interstitial prominence since prior study likely reflects interstitial edema. No airspace disease, effusion, or pneumothorax. No acute bony abnormalities. IMPRESSION: 1. Increased interstitial prominence compatible with interstitial edema. Electronically Signed   By: Randa Ngo M.D.   On:  01/25/2020 22:52   IR Paracentesis  Result Date: 01/24/2020 INDICATION: End-stage renal disease, cirrhosis, hepatitis B and C, recurrent ascites. Request for therapeutic paracentesis EXAM: ULTRASOUND GUIDED PARACENTESIS MEDICATIONS: 1% lidocaine 15 mL COMPLICATIONS: None immediate. PROCEDURE: Informed written consent was obtained from the patient after a discussion of the risks, benefits and alternatives to treatment. A timeout was performed prior to the initiation of the procedure. Initial ultrasound scanning demonstrates a moderate amount of ascites within the left lower abdominal quadrant. The left lower abdomen was prepped and draped in the usual sterile fashion. 1% lidocaine was used for local anesthesia. Following this, a 6 Fr Safe-T-Centesis catheter was introduced. An ultrasound image was saved for documentation purposes. The paracentesis was performed. The catheter was removed and a dressing was applied. The patient tolerated the procedure well without immediate post procedural complication. FINDINGS: A total of approximately 1.4 L of amber fluid was removed. IMPRESSION: Successful ultrasound-guided paracentesis yielding 1.4 liters of peritoneal fluid. Read by: Gareth Eagle, PA-C Electronically Signed   By: Aletta Edouard M.D.   On: 01/24/2020 14:49    EKG: Independently reviewed. HR 160. Possibly atrial flutter with RVR. QTc 583. No STEMI.  Assessment/Plan Principal Problem:   Acute respiratory failure with hypoxia (HCC) Active Problems:   Alcohol abuse   Nicotine dependence, cigarettes, uncomplicated   Chronic hepatitis B (HCC)   Chronic hepatitis C without hepatic coma (HCC)   COPD exacerbation (HCC)   Essential hypertension   Acute on chronic respiratory failure with hypoxia (HCC)   Arrhythmia   COPD (chronic obstructive pulmonary disease) (HCC)   Hyperkalemia   Atypical atrial flutter (HCC)   ESRD on dialysis (HCC)   Cirrhosis of liver with ascites (HCC)   QT prolongation    Tobacco use  59 yo M with PMH ESRD, CAD, PAF, cirrhosis, hep B & C, EtOH abuse, COPD, and tobacco abuse presented to ED with SOB and tachycardia and admitted for COPD exacerbation and a flutter with RVR.  Respiratory failure with hypoxia COPD exacerbation: Acute on chronic.  Patient presents with complaints of shortness of breath, cough, increased sputum production and wheezing.  Currently on non-rebreather and maintaining sats.  On physical exam patient with expiratory wheezing bilaterally.  CXR: Increased interstitial prominence compatible with interstitial edema.  Patient had been given 125 mg of Solu-Medrol, magnesium sulfate, and inhalers.  -Admit to a progressive bed -Continuous pulse oximetry with non-rebreather, titrate down as able -DuoNebs q6h PRN -Solu-Medrol 60 mg every 8 hours for first 2 days and then possibly switch to PO option as able  -Prophylactic antibiotics of Ceftin (prefer Levaquin or Cipro but not ideal with prolonged QTc) -Mucinex -NPO except for meds while on non-rebreather  Paroxysmal atrial fibrillation vs atrial flutter: Acute on chronic. Hypotensive with heart rate in 150's but clinically stable. Not on anticoagulation due to cirrhosis of the liver and issues with compliance on warfarin per notes per Cards. D/w Dr. Virgina Jock on admission;  recommended Amiodarone infusion and they will consult and consider cardioversion. -Hold home Metoprolol due to low BP's  Cirrhosis of the liver with ascites: Acute on chronic.  Prior history of cirrhosis secondary to a prior history of alcohol abuse along with hepatitis B and C. Paracentesis last done on 7/15. -Repeat paracentesis PRN -Daily MELD  Abdominal pain: Acute on chronic.  Patient reports continued abdominal pain that is unchanged from previous.  Records note previous medications include oxycodone 10 mg as needed for pain. -Hold home Oxycodone while on non-rebreather, judicious use of IV Dilaudid   Essential  hypertension: Home blood pressure medications include metoprolol 100 mg twice daily and hydralazine 100 mg 2 times daily. -Hold home regimen due to hypotension; restart when able   Prolonged QT interval: Acute on chronic. QTc 583 on admission. Patient has long history of prolonged QT. -Continue to monitor on telemetry -Avoid QT prolonging meds as able   ESRD on HD: Last dialysis 7/16 and 1.5L removed. On physical exam patient does not appear to be grossly fluid overloaded. K 5.2. -HD per nephrology; will need to consult   GERD: Patient without specific GERD complaints today. -Hold Protonix due to prolonged QT -GI cocktail can be ordered as needed for heartburn/indigestion  Tobacco use: Patient admits to smoking less than half pack cigarettes per day on average.  Declines need of nicotine patch. -Counseled on need of continued cessation of tobacco use  Code Status: Full DVT Prophylaxis: Heparin Family Communication: None Disposition Plan: Admit to inpatient. Patient acutely ill with multiple chronic serious co-morbidities. Requires O2, specialty consultation and IV medications. Patient is at high risk for further decompensation due to age and co-morbidities. Cardiology consulted on admission. Anticipate discharge home in 3-4 days.    Time spent: 70 minutes.   Chauncey Mann, MD [facility] Pager [phone]

## 2020-01-26 NOTE — ED Notes (Signed)
Diet tray ordered 

## 2020-01-26 NOTE — ED Notes (Signed)
Gauze padding placed under IV tubing per pt request, small amount of bleeding noted to sacrum, wound noted however blood appears to be coming from freshly scratched area, Meriplex applied, pt reminded not to dig with fingernails in this area.  Pt requesting food and beverage.

## 2020-01-26 NOTE — ED Notes (Signed)
Pt requesting 2 eggs scrambled soft with cheese, grits, bacon and a muffin asap.

## 2020-01-26 NOTE — ED Notes (Signed)
Checked patient cbg it was 84 notified RN [place] of blood sugar patient is resting with call bell in reach

## 2020-01-26 NOTE — Consult Note (Signed)
CARDIOLOGY CONSULT NOTE  Patient ID: [name] MRN: [id number] DOB/AGE: 1960/12/30 59 y.o.  Admit date: 01/25/2020 Referring Physician: [facility] Reason for Consultation:  Tachycardia.  HPI:   59 y/o African Americanmale with hypertension, CAD (mid RCA PCI 02/2017), persistent Afib/flutter, severe COPD, ESRD on hemodialysis, chronic hepatitis B and C with liver cirrhosis, prior h/o GI bleed, h/o polysubstance abuse, admitted with Afib/flutter.   Patient presented to ED on 7/16 with complaints of shortness of breath, productive cough, tachycardia. He completed his dialysis on 7/16. Workup shows mild leukocytosis, K 5.9-->5.2, interstitial edema on chest Xray. He has been on amiodarone drip without improvement in HR. BP is low normal.   Of note, he has been deemed not a candidate for anticoagulation due to end stage liver and kidney disease, h/o GI bleeding, and noncompliance.   On my interview this morning, he says he is feeling :fine as a fiddle", doe snot want to be shocked, hopes that the medications can "work [employer]", so he can go home. He reports that he is not taking metoprolol, diltiazem, or amiodarone, does not like food in the hospital, enjoys eating restaurant food everyday.   Past Medical History:  Diagnosis Date  . Anemia   . Anxiety   . Arthritis    left shoulder  . Atherosclerosis of aorta ([place])   . Cardiomegaly   . Chest pain    DATE UNKNOWN, C/O PERIODICALLY  . Cocaine abuse ([place])   . COPD exacerbation ([place]) 08/17/2016  . Coronary artery disease    stent 02/22/17  . ESRD (end stage renal disease) on dialysis ([place])    "[name]; MWF" (07/04/2017)  . GERD (gastroesophageal reflux disease)    DATE UNKNOWN  . Hemorrhoids   . Hepatitis B, chronic ([place])   . Hepatitis C   . History of kidney stones   . Hyperkalemia   . Hypertension   . Kidney failure   . Metabolic bone disease    Patient denies  . Mitral stenosis   . Myocardial infarction  ([place])   . Pneumonia   . Pulmonary edema   . Solitary rectal ulcer syndrome 07/2017   at [facility] for rectal bleeding  . Tubular adenoma of colon      Past Surgical History:  Procedure Laterality Date  . A/V FISTULAGRAM Left 05/26/2017   Procedure: A/V FISTULAGRAM;  Surgeon: Conrad Braham, MD;  Location: [facility];  Service: Cardiovascular;  Laterality: Left;  . A/V FISTULAGRAM Right 11/18/2017   Procedure: A/V FISTULAGRAM - Right Arm;  Surgeon: Elam Dutch, MD;  Location: [facility] [facility];  Service: Cardiovascular;  Laterality: Right;  . APPLICATION OF WOUND VAC Left 06/14/2017   Procedure: APPLICATION OF WOUND VAC;  Surgeon: Katha Cabal, MD;  Location: [facility];  Service: Vascular;  Laterality: Left;  . AV FISTULA PLACEMENT  2012   BELIEVED WAS PLACED IN JUNE  . AV FISTULA PLACEMENT Right 08/09/2017   Procedure: Creation Right arm ARTERIOVENOUS BRACHIOCEPOHALIC FISTULA;  Surgeon: Elam Dutch, MD;  Location: [facility];  Service: Vascular;  Laterality: Right;  . AV FISTULA PLACEMENT Right 11/22/2017   Procedure: INSERTION OF ARTERIOVENOUS (AV) GORE-TEX GRAFT RIGHT UPPER ARM;  Surgeon: Elam Dutch, MD;  Location: [place];  Service: Vascular;  Laterality: Right;  . BIOPSY  01/25/2018   Procedure: BIOPSY;  Surgeon: Jerene Bears, MD;  Location: [place];  Service: Gastroenterology;;  . BIOPSY  04/10/2019   Procedure: BIOPSY;  Surgeon: Jerene Bears, MD;  Location: [facility];  Service: Gastroenterology;;  . COLONOSCOPY    . COLONOSCOPY WITH PROPOFOL N/A 01/25/2018   Procedure: COLONOSCOPY WITH PROPOFOL;  Surgeon: Jerene Bears, MD;  Location: [place];  Service: Gastroenterology;  Laterality: N/A;  . CORONARY STENT INTERVENTION N/A 02/22/2017   Procedure: CORONARY STENT INTERVENTION;  Surgeon: Nigel Mormon, MD;  Location: [place] [facility];  Service: Cardiovascular;  Laterality: N/A;  . ESOPHAGOGASTRODUODENOSCOPY (EGD) WITH PROPOFOL N/A 01/25/2018    Procedure: ESOPHAGOGASTRODUODENOSCOPY (EGD) WITH PROPOFOL;  Surgeon: Jerene Bears, MD;  Location: [place];  Service: Gastroenterology;  Laterality: N/A;  . ESOPHAGOGASTRODUODENOSCOPY (EGD) WITH PROPOFOL N/A 04/10/2019   Procedure: ESOPHAGOGASTRODUODENOSCOPY (EGD) WITH PROPOFOL;  Surgeon: Jerene Bears, MD;  Location: [facility];  Service: Gastroenterology;  Laterality: N/A;  . FLEXIBLE SIGMOIDOSCOPY N/A 07/15/2017   Procedure: FLEXIBLE SIGMOIDOSCOPY;  Surgeon: Carol Ada, MD;  Location: [place];  Service: Endoscopy;  Laterality: N/A;  . HEMORRHOID BANDING    . I & D EXTREMITY Left 06/01/2017   Procedure: IRRIGATION AND DEBRIDEMENT LEFT ARM HEMATOMA WITH LIGATION OF LEFT ARM AV FISTULA;  Surgeon: Elam Dutch, MD;  Location: [place];  Service: Vascular;  Laterality: Left;  . I & D EXTREMITY Left 06/14/2017   Procedure: IRRIGATION AND DEBRIDEMENT EXTREMITY;  Surgeon: Katha Cabal, MD;  Location: [facility];  Service: Vascular;  Laterality: Left;  . INSERTION OF DIALYSIS CATHETER  05/30/2017  . INSERTION OF DIALYSIS CATHETER N/A 05/30/2017   Procedure: INSERTION OF DIALYSIS CATHETER;  Surgeon: Elam Dutch, MD;  Location: [place];  Service: Vascular;  Laterality: N/A;  . IR PARACENTESIS  08/30/2017  . IR PARACENTESIS  09/29/2017  . IR PARACENTESIS  10/28/2017  . IR PARACENTESIS  11/09/2017  . IR PARACENTESIS  11/16/2017  . IR PARACENTESIS  11/28/2017  . IR PARACENTESIS  12/01/2017  . IR PARACENTESIS  12/06/2017  . IR PARACENTESIS  01/03/2018  . IR PARACENTESIS  01/23/2018  . IR PARACENTESIS  02/07/2018  . IR PARACENTESIS  02/21/2018  . IR PARACENTESIS  03/06/2018  . IR PARACENTESIS  03/17/2018  . IR PARACENTESIS  04/04/2018  . IR PARACENTESIS  12/28/2018  . IR PARACENTESIS  01/08/2019  . IR PARACENTESIS  01/23/2019  . IR PARACENTESIS  02/01/2019  . IR PARACENTESIS  02/19/2019  . IR PARACENTESIS  03/01/2019  . IR PARACENTESIS  03/15/2019  . IR PARACENTESIS  04/03/2019  . IR PARACENTESIS   04/12/2019  . IR PARACENTESIS  05/01/2019  . IR PARACENTESIS  05/08/2019  . IR PARACENTESIS  05/24/2019  . IR PARACENTESIS  06/12/2019  . IR PARACENTESIS  07/09/2019  . IR PARACENTESIS  07/27/2019  . IR PARACENTESIS  08/09/2019  . IR PARACENTESIS  08/21/2019  . IR PARACENTESIS  09/17/2019  . IR PARACENTESIS  10/05/2019  . IR PARACENTESIS  10/29/2019  . IR PARACENTESIS  11/08/2019  . IR PARACENTESIS  12/12/2019  . IR PARACENTESIS  01/03/2020  . IR PARACENTESIS  01/10/2020  . IR PARACENTESIS  01/17/2020  . IR PARACENTESIS  01/24/2020  . IR RADIOLOGIST EVAL & MGMT  02/14/2018  . IR RADIOLOGIST EVAL & MGMT  02/22/2019  . LEFT HEART CATH AND CORONARY ANGIOGRAPHY N/A 02/22/2017   Procedure: LEFT HEART CATH AND CORONARY ANGIOGRAPHY;  Surgeon: Nigel Mormon, MD;  Location: [place] [facility];  Service: Cardiovascular;  Laterality: N/A;  . LIGATION OF ARTERIOVENOUS  FISTULA Left 01/17/4695   Procedure: Plication of Left Arm  Arteriovenous Fistula;  Surgeon: Elam Dutch, MD;  Location: [place];  Service: Vascular;  Laterality: Left;  . POLYPECTOMY    . POLYPECTOMY  01/25/2018   Procedure: POLYPECTOMY;  Surgeon: Jerene Bears, MD;  Location: [place];  Service: Gastroenterology;;  . REVISON OF ARTERIOVENOUS FISTULA Left 01/21/4579   Procedure: PLICATION OF DISTAL ANEURYSMAL SEGEMENT OF LEFT UPPER ARM ARTERIOVENOUS FISTULA;  Surgeon: Elam Dutch, MD;  Location: [place];  Service: Vascular;  Laterality: Left;  . REVISON OF ARTERIOVENOUS FISTULA Left 9/98/3382   Procedure: Plication of Left Upper Arm Fistula ;  Surgeon: Waynetta Sandy, MD;  Location: [place];  Service: Vascular;  Laterality: Left;  . SKIN GRAFT SPLIT THICKNESS LEG / FOOT Left    SKIN GRAFT SPLIT THICKNESS LEFT ARM DONOR SITE: LEFT ANTERIOR THIGH  . SKIN SPLIT GRAFT Left 07/04/2017   Procedure: SKIN GRAFT SPLIT THICKNESS LEFT ARM DONOR SITE: LEFT ANTERIOR THIGH;  Surgeon: Elam Dutch, MD;  Location: [place];  Service: Vascular;   Laterality: Left;  . THROMBECTOMY W/ EMBOLECTOMY Left 06/05/2017   Procedure: EXPLORATION OF LEFT ARM FOR BLEEDING; OVERSEWED PROXIMAL FISTULA;  Surgeon: Angelia Mould, MD;  Location: [place];  Service: Vascular;  Laterality: Left;  . WOUND EXPLORATION Left 06/03/2017   Procedure: WOUND EXPLORATION WITH WOUND VAC APPLICATION TO LEFT ARM;  Surgeon: Angelia Mould, MD;  Location: [facility];  Service: Vascular;  Laterality: Left;     Family History  Problem Relation Age of Onset  . Heart disease Mother   . Lung cancer Mother   . Heart disease Father   . Malignant hyperthermia Father   . COPD Father   . Throat cancer Sister   . Esophageal cancer Sister   . Hypertension Other   . COPD Other   . Colon cancer Neg Hx   . Colon polyps Neg Hx   . Rectal cancer Neg Hx   . Stomach cancer Neg Hx      Social History: Social History   Socioeconomic History  . Marital status: Single    Spouse name: Not on file  . Number of children: 3  . Years of education: 10  . Highest education level: Not on file  Occupational History  . Occupation: Unemployed  Tobacco Use  . Smoking status: Current Every Day Smoker    Packs/day: 0.50    Years: 43.00    Pack years: 21.50    Types: Cigarettes    Start date: 08/13/1973  . Smokeless tobacco: Never Used  . Tobacco comment: i dont know i just make   Vaping Use  . Vaping Use: Never used  Substance and Sexual Activity  . Alcohol use: Not Currently    Comment: quit drinking in 2017  . Drug use: Not Currently    Types: Marijuana, Cocaine    Comment: reports using once every 3 months, 04-06-2019 was this   . Sexual activity: Not Currently    Birth control/protection: None  Other Topics Concern  . Not on file  Social History Narrative   Lives alone   Caffeine use: Coffee-rare   Soda- daily      [facility] (03/10/17):   Originally from [facility]. Previously worked trimming trees. No pets currently. No bird or mold exposure.    Social  Determinants of Health   Financial Resource Strain:   . Difficulty of Paying Living Expenses:   Food Insecurity:   . Worried About Charity fundraiser in the Last  Year:   . Ran Out of Food in the Last Year:   Transportation Needs:   . Film/video editor (Medical):   Marland Kitchen Lack of Transportation (Non-Medical):   Physical Activity:   . Days of Exercise per Week:   . Minutes of Exercise per Session:   Stress:   . Feeling of Stress :   Social Connections:   . Frequency of Communication with Friends and Family:   . Frequency of Social Gatherings with Friends and Family:   . Attends Religious Services:   . Active Member of Clubs or Organizations:   . Attends Archivist Meetings:   Marland Kitchen Marital Status:   Intimate Partner Violence:   . Fear of Current or Ex-Partner:   . Emotionally Abused:   Marland Kitchen Physically Abused:   . Sexually Abused:      (Not in a hospital admission)   Review of Systems  Constitutional: Negative for decreased appetite, malaise/fatigue, weight gain and weight loss.  HENT: Negative for congestion.   Eyes: Negative for visual disturbance.  Cardiovascular: Positive for dyspnea on exertion and palpitations. Negative for chest pain, leg swelling and syncope.  Respiratory: Positive for cough and shortness of breath.   Endocrine: Negative for cold intolerance.  Hematologic/Lymphatic: Does not bruise/bleed easily.  Skin: Negative for itching and rash.  Musculoskeletal: Negative for myalgias.  Gastrointestinal: Negative for abdominal pain, nausea and vomiting.  Genitourinary: Negative for dysuria.  Neurological: Negative for dizziness and weakness.  Psychiatric/Behavioral: The patient is not nervous/anxious.   All other systems reviewed and are negative.     Physical Exam: Physical Exam Vitals and nursing note reviewed.  Constitutional:      General: He is not in acute distress.    Appearance: He is well-developed.  HENT:     Head: Normocephalic and  atraumatic.  Eyes:     Conjunctiva/sclera: Conjunctivae normal.     Pupils: Pupils are equal, round, and reactive to light.  Neck:     Vascular: No JVD.  Cardiovascular:     Rate and Rhythm: Regular rhythm. Tachycardia present.     Pulses: Normal pulses and intact distal pulses.     Heart sounds: Murmur heard.  Low-pitched rumbling crescendo presystolic murmur is present with a grade of 2/4 at the apex.   Pulmonary:     Effort: Pulmonary effort is normal.     Breath sounds: Normal breath sounds. No wheezing or rales.  Abdominal:     General: Bowel sounds are normal.     Palpations: Abdomen is soft.     Tenderness: There is no rebound.  Musculoskeletal:        General: No tenderness. Normal range of motion.     Left lower leg: No edema.  Lymphadenopathy:     Cervical: No cervical adenopathy.  Skin:    General: Skin is warm and dry.  Neurological:     Mental Status: He is alert and oriented to person, place, and time.     Cranial Nerves: No cranial nerve deficit.      Labs:   Lab Results  Component Value Date   WBC 12.0 (H) 01/25/2020   HGB 12.2 (L) 01/25/2020   HCT 35.6 (L) 01/25/2020   MCV 80.0 01/25/2020   PLT 232 01/25/2020    Recent Labs  Lab 01/25/20 2218 01/25/20 2218 01/26/20 0221  NA 133*  --   --   K 5.9*   < > 5.2*  CL 89*  --   --  CO2 29  --   --   BUN 28*  --   --   CREATININE 3.94*  --   --   CALCIUM 9.5  --   --   PROT 6.5  --   --   BILITOT 1.0  --   --   ALKPHOS 364*  --   --   ALT 64*  --   --   AST 73*  --   --   GLUCOSE 102*  --   --    < > = values in this interval not displayed.    Lipid Panel     Component Value Date/Time   CHOL 103 08/17/2016 0134   TRIG 183 (H) 10/04/2019 1400   HDL 34 (L) 08/17/2016 0134   CHOLHDL 3.0 08/17/2016 0134   VLDL 19 08/17/2016 0134   LDLCALC 50 08/17/2016 0134    BNP (last 3 results) Recent Labs    10/06/19 1159 11/12/19 2341 01/14/20 1927  BNP 2,690.1* 3,672.9* 2,254.1*     HEMOGLOBIN A1C Lab Results  Component Value Date   HGBA1C 4.7 (L) 09/26/2019   MPG 88.19 09/26/2019     Lab Results  Component Value Date   CKTOTAL 274 (H) 12/23/2010   CKMB 4.6 (H) 12/23/2010      Radiology: DG Chest Port 1 View  Result Date: 01/25/2020 CLINICAL DATA:  Shortness of breath, end-stage renal disease, hepatitis, cirrhosis EXAM: PORTABLE CHEST 1 VIEW COMPARISON:  01/20/2020 FINDINGS: Single frontal view of the chest demonstrates persistent enlargement of the cardiac silhouette. Diffuse increased interstitial prominence since prior study likely reflects interstitial edema. No airspace disease, effusion, or pneumothorax. No acute bony abnormalities. IMPRESSION: 1. Increased interstitial prominence compatible with interstitial edema. Electronically Signed   By: Randa Ngo M.D.   On: 01/25/2020 22:52   IR Paracentesis  Result Date: 01/24/2020 INDICATION: End-stage renal disease, cirrhosis, hepatitis B and C, recurrent ascites. Request for therapeutic paracentesis EXAM: ULTRASOUND GUIDED PARACENTESIS MEDICATIONS: 1% lidocaine 15 mL COMPLICATIONS: None immediate. PROCEDURE: Informed written consent was obtained from the patient after a discussion of the risks, benefits and alternatives to treatment. A timeout was performed prior to the initiation of the procedure. Initial ultrasound scanning demonstrates a moderate amount of ascites within the left lower abdominal quadrant. The left lower abdomen was prepped and draped in the usual sterile fashion. 1% lidocaine was used for local anesthesia. Following this, a 6 Fr Safe-T-Centesis catheter was introduced. An ultrasound image was saved for documentation purposes. The paracentesis was performed. The catheter was removed and a dressing was applied. The patient tolerated the procedure well without immediate post procedural complication. FINDINGS: A total of approximately 1.4 L of amber fluid was removed. IMPRESSION: Successful  ultrasound-guided paracentesis yielding 1.4 liters of peritoneal fluid. Read by: Gareth Eagle, PA-C Electronically Signed   By: Aletta Edouard M.D.   On: 01/24/2020 14:49    Scheduled Meds: . dextromethorphan-guaiFENesin  1 tablet Oral BID  . gabapentin  100 mg Oral BID  . heparin  5,000 Units Subcutaneous Q8H  . insulin aspart  0-6 Units Subcutaneous Q4H  . ipratropium-albuterol  3 mL Nebulization Once  . methylPREDNISolone (SOLU-MEDROL) injection  60 mg Intravenous Q8H  . sodium zirconium cyclosilicate  10 g Oral Once   Continuous Infusions: . amiodarone 30 mg/hr (01/26/20 0548)  . amiodarone    . doxycycline (VIBRAMYCIN) IV     PRN Meds:.albuterol, HYDROmorphone (DILAUDID) injection, ipratropium-albuterol  CARDIAC STUDIES:  EKG 01/26/2020: Atrial flutter with RVR 150  bpm Underlying LBBB  Echocardiogram 10/04/2019: 1. Low normal LV systolic function; severe LVH (myocardium with specked appearance; consider amyloid); mild to moderate AS (mean gradient 11 mmHg  and AVA 1.3 cm2); mild AI; thickened MV with mild to moderate MS (mean gradient 6 mmHg and MVA 1.2-2 cm2;  pt in atrial fibrillation at time of study); severe biatrial enlargement; mild RVE; severe RV dysfunction; severe TR with severe pulmonary hypertension.  2. Left ventricular ejection fraction, by estimation, is 50 to 55%. The left ventricle has low normal function. The left ventricle has no regional  wall motion abnormalities. There is severe left ventricular hypertrophy.  Left ventricular diastolic function could not be evaluated.  3. Right ventricular systolic function is severely reduced. The right ventricular size is mildly enlarged. There is severely elevated pulmonary artery systolic pressure.  4. Left atrial size was severely dilated.  5. Right atrial size was severely dilated.  6. Large pleural effusion in the left lateral region.  7. The mitral valve is normal in structure. Mild mitral valve regurgitation. Mild  to moderate mitral stenosis.  8. Tricuspid valve regurgitation is severe.  9. The aortic valve is normal in structure. Aortic valve regurgitation is mild. Mild to moderate aortic valve stenosis.  10. The inferior vena cava is dilated in size with <50% respiratory variability, suggesting right atrial pressure of 15 mmHg.   Assessment & Recommendations:  59 y/o African Americanmale with hypertension, CAD (mid RCA PCI 02/2017), persistent Afib/flutter, severe COPD, ESRD on hemodialysis, chronic hepatitis B and C with liver cirrhosis, prior h/o GI bleed, h/o polysubstance abuse, admitted with Afib/flutter.   Aflutter with RVR: No response with amiodarone. Low normal BP, but otherwise stable. Stop amiodarone. Start diltiazem IV without bolus.  He is not taking metoprolol, diltiazem, or amiodarone at home. Recommend resuming amiodarone PO 200 mg bid at home, without metoprolol or diltiazem.  Given that he is not anticoagulated, would like to avoid elective cardioversion, if possible. That said, if he becomes hemodynamically unstable, will consider cardioversion. My feeling is that he will likely leave the hospital today. He has reaffirmed his wishes to be DNR.    Nigel Mormon, MD 01/26/2020, 10:05 AM [facility]. [place] Pager: [phone] Office: [phone] If no answer Cell [phone]

## 2020-01-26 NOTE — ED Notes (Signed)
Paged admitting per RN  

## 2020-01-26 NOTE — Progress Notes (Signed)
PROGRESS NOTE                                                                                                                                                                                                             Patient Demographics:    [name], is a 59 y.o. male, DOB - 10/31/1960, BTD:[id number]  Admit date - 01/25/2020   Admitting Physician Chauncey Mann, MD  Outpatient Primary MD for the patient is [name], FNP  LOS - 0   Chief Complaint  Patient presents with  . Atrial Fibrillation       Brief Narrative    This is a no charge note as patient was admitted and seen earlier today, chart, imaging and labs were reviewed   HPI: [name] is a 59 y.o. male with PMH of ESRD, CAD, PAF, cirrhosis, hep B & C, EtOH abuse, COPD, and tobacco abuse presented to ED with SOB and tachycardia and admitted for COPD exacerbation and a flutter with RVR.  Patient reports presenting today for increased shortness of breath as well as elevated heart rate. Reports SOB has been worsening over past several days with increased cough and sputum production. He came in today mainly because he noticed his elevated heart rate. Reports SOB worsens with smoking. Reports dialysis yesterday. He has been using inhalers at home but unsure if using correctly. Patient requests O2 in his home. Reports some abdominal pain when he uses the restroom but otherwise denies complaints. He is reluctant to stay but does not want to sign out AMA. Denies headache, dizziness, fever, chills, chest pain, nausea, vomiting, diarrhea, constipation, dysuria, hematuria, hematochezia, melena, difficulty moving arms/legs, speech difficulty, trouble eating, confusion or any other complaints.  In the ED: Tachycardic into 150's with hypotension. Currently on non-rebreather. Afebrile. Labs remarkable for WBC 12.0, Hgb 12.2, Na 133, glucose 102, Cr 3.94, AST 73, ALT 64, Alk phos  364, K 5.2.  CXR: Increased interstitial prominence compatible with interstitial edema.  Patient was placed on non-rebreather and given Albuterol, Benadryl, Dilaudid, Atrovent, Solumedrol and IV Mag. Admission requested for respiratory failure and tachycardia with hypotension.  Spoke with Cardiology on admission and they recommended Amiodarone drip and will consult.     Subjective:    [name] today reports some dyspnea, and cough .   Assessment  &  Plan :    Principal Problem:   Acute respiratory failure with hypoxia (HCC) Active Problems:   Alcohol abuse   Nicotine dependence, cigarettes, uncomplicated   Chronic hepatitis B (HCC)   Chronic hepatitis C without hepatic coma (HCC)   COPD exacerbation (HCC)   Essential hypertension   Acute on chronic respiratory failure with hypoxia (HCC)   Arrhythmia   COPD (chronic obstructive pulmonary disease) (HCC)   Hyperkalemia   Atypical atrial flutter (HCC)   ESRD on dialysis ([place])   Cirrhosis of liver with ascites (HCC)   QT prolongation   Tobacco use   Respiratory failure with hypoxia COPD exacerbation: Acute on chronic. Patient presents with complaints of shortness of breath, cough, increased sputum production and wheezing. Currently on non-rebreather and maintaining sats. On physical exam patient with expiratory wheezing bilaterally. CXR: Increased interstitial prominence compatible with interstitial edema. Patient had been given 125 mg of Solu-Medrol, magnesium sulfate, and inhalers.  -Admit to a progressive bed -Continuous pulse oximetry with non-rebreather, titrate down as able -DuoNebs q6h PRN -Solu-Medrol 60 mg every 8 hours for first 2 days and then possibly switch to PO option as able  -Prophylactic antibiotics of Ceftin (prefer Levaquin or Cipro but not ideal with prolonged QTc) -Mucinex -Oxygen requirement has improved, he is currently on 3 L nasal cannula.  Paroxysmal atrial fibrillation vs atrial  flutter:  - Acute on chronic. Hypotensive with heart rate in 150's on admission. Not on anticoagulation due to cirrhosis of the liver and issues with compliance on warfarin per notes per Cards. -Cardiology input greatly appreciated, he has been switched from amiodarone drip to Cardizem drip for now.  Cirrhosis of the liver with ascites: Acute on chronic. Prior history of cirrhosis secondary to a prior history of alcohol abuse along with hepatitis B and C. Paracentesis last done on 7/15. -Repeat paracentesis PRN -Daily MELD  Abdominal pain:Acute on chronic. Patient reports continued abdominal pain that is unchanged from previous. Records note previous medications include oxycodone 10 mg as needed for pain. -Hold home Oxycodone while on non-rebreather, judicious use of IV Dilaudid   Essential hypertension: Home blood pressure medications include metoprolol 100 mg twice daily and hydralazine 100 mg 2 times daily. -Hold home regimen due to hypotension; restart when able   Prolonged QT interval: Acute on chronic. QTc 583 on admission.Patient has long history of prolonged QT. -Continue to monitor on telemetry -Avoid QT prolonging meds as able   ESRD on HD: Last dialysis 7/16 and 1.5L removed.On physical exam patient does not appear to be grossly fluid overloaded. K 5.2. -HD per nephrology; will need to consult   GERD: Patient without specific GERD complaints today. -Hold Protonix due to prolonged QT -GI cocktail can be ordered as needed for heartburn/indigestion  Tobacco use: Patient admits to smoking less than half pack cigarettes per day on average. Declines need of nicotine patch. -Counseled onneed of continued cessation of tobacco use   COVID-19 Labs  No results for input(s): DDIMER, FERRITIN, LDH, CRP in the last 72 hours.  Lab Results  Component Value Date   SARSCOV2NAA NEGATIVE 01/26/2020   [place] NEGATIVE 01/19/2020   [place] NEGATIVE 01/14/2020    [place] NEGATIVE 01/13/2020     Code Status : DNR  Family Communication  : None at bedside  Disposition Plan  :  Status is: Inpatient  Remains inpatient appropriate because:IV treatments appropriate due to intensity of illness or inability to take PO   Dispo: The patient is from: Home  Anticipated d/c is to: Home              Anticipated d/c date is: 2 days              Patient currently is not medically stable to d/c.         Consults  :  Cardiology  Procedures  : none  DVT Prophylaxis  :  [place] heparin  Lab Results  Component Value Date   PLT 232 01/25/2020    Antibiotics  :    Anti-infectives (From admission, onward)   Start     Dose/Rate Route Frequency Ordered Stop   01/26/20 0930  doxycycline (VIBRAMYCIN) 100 mg in sodium chloride 0.9 % 250 mL IVPB     Discontinue     100 mg 125 mL/hr over 120 Minutes Intravenous Every 12 hours 01/26/20 0928 01/31/20 0929        Objective:   Vitals:   01/26/20 1511 01/26/20 1525 01/26/20 1554 01/26/20 1609  BP: (!) 196/145 120/71 100/77 116/89  Pulse: (!) 133 (!) 109    Resp: 18     Temp: 98.3 F (36.8 C) 98.3 F (36.8 C)    TempSrc: Oral     SpO2: 96% 96%      Wt Readings from Last 3 Encounters:  01/19/20 61.2 kg  01/14/20 61.2 kg  01/14/20 53.4 kg     Intake/Output Summary (Last 24 hours) at 01/26/2020 1637 Last data filed at 01/26/2020 1600 Gross per 24 hour  Intake 799.92 ml  Output --  Net 799.92 ml     Physical Exam  Awake Alert, Oriented X 3, No new F.N deficits, Normal affect Symmetrical Chest wall movement, Good air movement bilaterally, scattered wheezing Irregular irregular,No Gallops,Rubs or new Murmurs, No Parasternal Heave +ve B.Sounds, Abd Soft, No tenderness, No organomegaly appriciated, No rebound - guarding or rigidity. No Cyanosis, Clubbing or edema, No new Rash or bruise      Data Review:    CBC Recent Labs  Lab 01/20/20 0044 01/20/20 1543 01/25/20 2218   WBC 11.4* 15.6* 12.0*  HGB 11.5* 11.6* 12.2*  HCT 33.6* 34.0* 35.6*  PLT 192 210 232  MCV 80.4 80.4 80.0  MCH 27.5 27.4 27.4  MCHC 34.2 34.1 34.3  RDW 15.7* 15.6* 15.8*  LYMPHSABS  --   --  1.3  MONOABS  --   --  1.5*  EOSABS  --   --  0.2  BASOSABS  --   --  0.1    Chemistries  Recent Labs  Lab 01/19/20 1728 01/20/20 0044 01/20/20 1543 01/25/20 2218 01/26/20 0221 01/26/20 1146  NA 130* 134* 132* 133*  --   --   K 7.3* 3.8 4.5 5.9* 5.2*  --   CL 87* 93* 89* 89*  --   --   CO2 _0 --   --   GLUCOSE 182* 170* 171* 102*  --   --   BUN 38* 15 36* 28*  --   --   CREATININE 4.99* 2.61* 4.36* 3.94*  --   --   CALCIUM 9.6 9.2 9.5 9.5  --   --   MG  --  2.4  --   --   --  2.8*  AST  --   --   --  73*  --   --   ALT  --   --   --  64*  --   --   ALKPHOS  --   --   --  364*  --   --   BILITOT  --   --   --  1.0  --   --    ------------------------------------------------------------------------------------------------------------------ No results for input(s): CHOL, HDL, LDLCALC, TRIG, CHOLHDL, LDLDIRECT in the last 72 hours.  Lab Results  Component Value Date   HGBA1C 4.8 01/26/2020   ------------------------------------------------------------------------------------------------------------------ No results for input(s): TSH, T4TOTAL, T3FREE, THYROIDAB in the last 72 hours.  Invalid input(s): FREET3 ------------------------------------------------------------------------------------------------------------------ No results for input(s): VITAMINB12, FOLATE, FERRITIN, TIBC, IRON, RETICCTPCT in the last 72 hours.  Coagulation profile Recent Labs  Lab 01/26/20 1146  INR 1.1    No results for input(s): DDIMER in the last 72 hours.  Cardiac Enzymes No results for input(s): CKMB, TROPONINI, MYOGLOBIN in the last 168 hours.  Invalid input(s):  CK ------------------------------------------------------------------------------------------------------------------    Component Value Date/Time   BNP 2,254.1 (H) 01/14/2020 1927    Inpatient Medications  Scheduled Meds: . dextromethorphan-guaiFENesin  1 tablet Oral BID  . gabapentin  100 mg Oral BID  . heparin  5,000 Units Subcutaneous Q8H  . insulin aspart  0-6 Units Subcutaneous Q4H  . ipratropium-albuterol  3 mL Nebulization Once  . methylPREDNISolone (SOLU-MEDROL) injection  60 mg Intravenous Q8H  . sodium zirconium cyclosilicate  10 g Oral Once   Continuous Infusions: . diltiazem (CARDIZEM) infusion 10 mg/hr (01/26/20 1536)  . doxycycline (VIBRAMYCIN) IV 100 mg (01/26/20 1146)   PRN Meds:.albuterol, HYDROmorphone (DILAUDID) injection, ipratropium-albuterol  Micro Results Recent Results (from the past 240 hour(s))  SARS Coronavirus 2 by RT PCR (hospital order, performed in [facility] hospital lab) Nasopharyngeal Nasopharyngeal Swab     Status: None   Collection Time: 01/19/20  9:57 AM   Specimen: Nasopharyngeal Swab  Result Value Ref Range Status   SARS Coronavirus 2 NEGATIVE NEGATIVE Final    Comment: (NOTE) SARS-CoV-2 target nucleic acids are NOT DETECTED.  The SARS-CoV-2 RNA is generally detectable in upper and lower respiratory specimens during the acute phase of infection. The lowest concentration of SARS-CoV-2 viral copies this assay can detect is 250 copies / mL. A negative result does not preclude SARS-CoV-2 infection and should not be used as the sole basis for treatment or other patient management decisions.  A negative result may occur with improper specimen collection / handling, submission of specimen other than nasopharyngeal swab, presence of viral mutation(s) within the areas targeted by this assay, and inadequate number of viral copies (<250 copies / mL). A negative result must be combined with clinical observations, patient history, and  epidemiological information.  Fact Sheet for Patients:   StrictlyIdeas.no  Fact Sheet for Healthcare Providers: BankingDealers.co.za  This test is not yet approved or  cleared by the [place] FDA and has been authorized for detection and/or diagnosis of SARS-CoV-2 by FDA under an Emergency Use Authorization (EUA).  This EUA will remain in effect (meaning this test can be used) for the duration of the COVID-19 declaration under Section 564(b)(1) of the Act, 21 U.S.C. section 360bbb-3(b)(1), unless the authorization is terminated or revoked sooner.  Performed at [facility], [place] [address] [place],  [address]   SARS Coronavirus 2 by RT PCR (hospital order, performed in [facility] hospital lab) Nasopharyngeal Nasopharyngeal Swab     Status: None   Collection Time: 01/26/20 12:47 AM   Specimen: Nasopharyngeal Swab  Result Value Ref Range Status   SARS Coronavirus 2 NEGATIVE NEGATIVE Final    Comment: (NOTE) SARS-CoV-2 target nucleic acids are NOT DETECTED.  The SARS-CoV-2  RNA is generally detectable in upper and lower respiratory specimens during the acute phase of infection. The lowest concentration of SARS-CoV-2 viral copies this assay can detect is 250 copies / mL. A negative result does not preclude SARS-CoV-2 infection and should not be used as the sole basis for treatment or other patient management decisions.  A negative result may occur with improper specimen collection / handling, submission of specimen other than nasopharyngeal swab, presence of viral mutation(s) within the areas targeted by this assay, and inadequate number of viral copies (<250 copies / mL). A negative result must be combined with clinical observations, patient history, and epidemiological information.  Fact Sheet for Patients:   StrictlyIdeas.no  Fact Sheet for Healthcare  Providers: BankingDealers.co.za  This test is not yet approved or  cleared by the [place] FDA and has been authorized for detection and/or diagnosis of SARS-CoV-2 by FDA under an Emergency Use Authorization (EUA).  This EUA will remain in effect (meaning this test can be used) for the duration of the COVID-19 declaration under Section 564(b)(1) of the Act, 21 U.S.C. section 360bbb-3(b)(1), unless the authorization is terminated or revoked sooner.  Performed at [facility], [place] [address], [place], [place] [address]     Radiology Reports DG Chest 2 View  Result Date: 01/20/2020 CLINICAL DATA:  Burning sensation in chest. EXAM: CHEST - 2 VIEW COMPARISON:  January 19, 2020 FINDINGS: Stable cardiomegaly. Hila and mediastinum are normal. Mild haziness in the periphery of the lungs bilaterally. Mild atelectasis in the left base. No other acute abnormalities. IMPRESSION: Mild haziness in the peripheries of the lungs bilaterally may represent overlapping soft tissues or subtle developing infiltrates. Recommend clinical correlation and close attention on follow-up. Electronically Signed   By: Dorise Bullion III M.D   On: 01/20/2020 16:23   DG Chest 2 View  Result Date: 01/19/2020 CLINICAL DATA:  Shortness of breath, productive cough. Dialysis yesterday. EXAM: CHEST - 2 VIEW COMPARISON:  Chest x-rays dated 01/14/2020 and 01/13/2020. Chest CT angiogram dated 01/14/2020. FINDINGS: Stable cardiomegaly. Lungs are clear. No pleural effusion or pneumothorax is seen. IMPRESSION: 1. Lungs are clear. No evidence of pneumonia or pulmonary edema on today's exam. 2. Stable cardiomegaly. Electronically Signed   By: Franki Cabot M.D.   On: 01/19/2020 08:22   CT Angio Chest PE W and/or Wo Contrast  Result Date: 01/14/2020 CLINICAL DATA:  Shortness of breath. Soft dialysis prematurely secondary to itching. Recent hospital discharge. EXAM: CT ANGIOGRAPHY CHEST WITH CONTRAST TECHNIQUE:  Multidetector CT imaging of the chest was performed using the standard protocol during bolus administration of intravenous contrast. Multiplanar CT image reconstructions and MIPs were obtained to evaluate the vascular anatomy. CONTRAST:  53m OMNIPAQUE IOHEXOL 350 MG/ML SOLN COMPARISON:  CT chest 12/23/2019 FINDINGS: Cardiovascular: Satisfactory opacification the pulmonary arteries to the segmental level. No pulmonary artery filling defects are identified. Central pulmonary arteries are normal caliber. Suboptimal opacification of the aorta for luminal evaluation. Atherosclerotic plaque within the normal caliber aorta. Normal 3 vessel branching of the aortic arch. Extensive atheromatous plaque throughout the proximal great vessels as included. Mild cardiomegaly with predominantly biatrial enlargement. Dense calcifications noted upon the coronary arteries as well as upon the mitral annulus and aortic leaflets. Small pericardial effusion. Distension of the azygos. Reflux of contrast into the IVC and hepatic veins. No other major venous abnormalities on this arterial phase imaging. Mediastinum/Nodes: Edematous changes the mediastinum. Normal thyroid gland and thoracic inlet. No acute abnormality of the trachea or esophagus.  No worrisome mediastinal, hilar or axillary adenopathy. Lungs/Pleura: Small bilateral pleural effusions with adjacent areas of passive atelectasis. Septal and fissural thickening is present with vascular redistribution. Some dependent areas of ground-glass opacity favoring a combination of passive and dependent atelectasis. No worrisome consolidative opacity. No pneumothorax. Stable biapical pleuroparenchymal scarring. No worrisome nodules or masses. Upper Abdomen: Upper abdominal ascites. No other acute upper abdominal abnormality is visible. Musculoskeletal: Diffuse body wall edema. Osseous manifestations of renal osteodystrophy. No worrisome bone or chest wall lesions. Review of the MIP images  confirms the above findings. IMPRESSION: 1. No evidence of acute pulmonary artery filling defects to suggest pulmonary embolism. 2. Cardiomegaly with predominantly biatrial enlargement. Small pericardial effusion. 3. Reflux of contrast into the IVC and hepatic veins, can be seen with right heart failure/fluid overload. 4. Small bilateral pleural effusions with adjacent areas of passive atelectasis. 5. Fissural and septal thickening with vascular redistribution likely to reflect some interstitial edema. 6. Diffuse body wall edema, upper abdominal ascites, and body wall edema, consistent with anasarca. 7. Osseous manifestations of renal osteodystrophy. 8. Aortic Atherosclerosis (ICD10-I70.0). Electronically Signed   By: Lovena Le M.D.   On: 01/14/2020 21:00   DG Chest Port 1 View  Result Date: 01/25/2020 CLINICAL DATA:  Shortness of breath, end-stage renal disease, hepatitis, cirrhosis EXAM: PORTABLE CHEST 1 VIEW COMPARISON:  01/20/2020 FINDINGS: Single frontal view of the chest demonstrates persistent enlargement of the cardiac silhouette. Diffuse increased interstitial prominence since prior study likely reflects interstitial edema. No airspace disease, effusion, or pneumothorax. No acute bony abnormalities. IMPRESSION: 1. Increased interstitial prominence compatible with interstitial edema. Electronically Signed   By: Randa Ngo M.D.   On: 01/25/2020 22:52   DG Chest Portable 1 View  Result Date: 01/14/2020 CLINICAL DATA:  Shortness of breath. EXAM: PORTABLE CHEST 1 VIEW COMPARISON:  January 13, 2020 FINDINGS: Mild, diffuse, chronic appearing increased lung markings are seen. There is decreased vascular congestion since the prior study. Mild, hazy areas of atelectasis and/or early infiltrate are seen along the periphery of the mid left lung. There is no evidence of a pleural effusion or pneumothorax. The cardiac silhouette is markedly enlarged. There is mild calcification of the aortic arch. The  visualized skeletal structures are unremarkable. IMPRESSION: 1. Mild, hazy areas of atelectasis and/or early infiltrate along the periphery of the mid left lung. 2. Decreased pulmonary vascular congestion when compared to the prior exam. Electronically Signed   By: Virgina Norfolk M.D.   On: 01/14/2020 18:29   DG Chest Portable 1 View  Result Date: 01/13/2020 CLINICAL DATA:  59 year old male with shortness of breath. EXAM: PORTABLE CHEST 1 VIEW COMPARISON:  Chest radiograph dated 12/21/2019 and CT dated 12/23/2019 FINDINGS: There is moderate enlargement of the cardiopericardial silhouette similar to the radiograph of 12/21/2019. There is central vascular prominence consistent with congestion. Trace bilateral pleural effusions noted. There are bibasilar atelectatic changes. No lobar consolidation or pneumothorax. Atherosclerotic calcification of the aorta and branches of the aortic arch. No acute osseous pathology. IMPRESSION: 1. Cardiomegaly with vascular congestion and possible mild edema. Overall slight interval worsening of the congestion since the radiograph of 12/21/2019. 2. Advanced atherosclerotic calcification of the aorta and branches of the aortic arch. Electronically Signed   By: Anner Crete M.D.   On: 01/13/2020 16:32   [place] ASCITES (ABDOMEN LIMITED)  Result Date: 01/14/2020 CLINICAL DATA:  Assess ascites. Multiple previous paracenteses due to cirrhosis. EXAM: LIMITED ABDOMEN ULTRASOUND FOR ASCITES TECHNIQUE: Limited ultrasound survey for ascites was performed in  all four abdominal quadrants. COMPARISON:  12/22/2019 FINDINGS: Examination demonstrates mild-to-moderate amount of ascites left greater than right. Ascites over the right abdomen is improved compared to the images from the previous exam. IMPRESSION: Mild-to-moderate ascites left greater than right. Electronically Signed   By: Marin Olp M.D.   On: 01/14/2020 21:52   IR Paracentesis  Result Date: 01/24/2020 INDICATION:  End-stage renal disease, cirrhosis, hepatitis B and C, recurrent ascites. Request for therapeutic paracentesis EXAM: ULTRASOUND GUIDED PARACENTESIS MEDICATIONS: 1% lidocaine 15 mL COMPLICATIONS: None immediate. PROCEDURE: Informed written consent was obtained from the patient after a discussion of the risks, benefits and alternatives to treatment. A timeout was performed prior to the initiation of the procedure. Initial ultrasound scanning demonstrates a moderate amount of ascites within the left lower abdominal quadrant. The left lower abdomen was prepped and draped in the usual sterile fashion. 1% lidocaine was used for local anesthesia. Following this, a 6 Fr Safe-T-Centesis catheter was introduced. An ultrasound image was saved for documentation purposes. The paracentesis was performed. The catheter was removed and a dressing was applied. The patient tolerated the procedure well without immediate post procedural complication. FINDINGS: A total of approximately 1.4 L of amber fluid was removed. IMPRESSION: Successful ultrasound-guided paracentesis yielding 1.4 liters of peritoneal fluid. Read by: Gareth Eagle, PA-C Electronically Signed   By: Aletta Edouard M.D.   On: 01/24/2020 14:49   IR Paracentesis  Result Date: 01/17/2020 INDICATION: Patient with history end-stage renal disease, cirrhosis, hepatitis B/C, recurrent ascites ;request made for therapeutic paracentesis EXAM: ULTRASOUND GUIDED THERAPEUTIC  PARACENTESIS MEDICATIONS: None COMPLICATIONS: None immediate. PROCEDURE: Informed written consent was obtained from the patient after a discussion of the risks, benefits and alternatives to treatment. A timeout was performed prior to the initiation of the procedure. Initial ultrasound scanning demonstrates a moderate amount of ascites within the left lower abdominal quadrant. The left lower abdomen was prepped and draped in the usual sterile fashion. 1% lidocaine was used for local anesthesia. Following this,  a 6 Fr Safe-T-Centesis catheter was introduced. An ultrasound image was saved for documentation purposes. The paracentesis was performed. The catheter was removed and a dressing was applied. The patient tolerated the procedure well without immediate post procedural complication. FINDINGS: A total of approximately 3.2 liters of hazy,amber fluid was removed. IMPRESSION: Successful ultrasound-guided therapeutic paracentesis yielding 3.2 liters of peritoneal fluid. Read by: Lindaann Pascal Electronically Signed   By: Markus Daft M.D.   On: 01/17/2020 13:42   IR Paracentesis  Result Date: 01/10/2020 INDICATION: Patient with a history of cirrhosis and recurrent ascites. Interventional radiology asked to perform a therapeutic paracentesis. EXAM: ULTRASOUND GUIDED PARACENTESIS MEDICATIONS: 1% lidocaine 20 mL COMPLICATIONS: None immediate. PROCEDURE: Informed written consent was obtained from the patient after a discussion of the risks, benefits and alternatives to treatment. A timeout was performed prior to the initiation of the procedure. Initial ultrasound scanning demonstrates a large amount of ascites within the right lower abdominal quadrant. The right lower abdomen was prepped and draped in the usual sterile fashion. 1% lidocaine was used for local anesthesia. Following this, a 19 gauge, 7-cm, Yueh catheter was introduced. A strong fluid return was obtained but when attached to suction the flow stopped. Multiple attempts at manipulating the catheter were made without success. Ultrasound imaging continued to demonstrate a large amount of ascites in the right lower quadrant. The catheter was removed and a second 19 gauge, 7-cm Yueh was inserted with the same results. The catheter was removed and a dressing  was applied. Ultrasound scanning demonstrated a large amount of ascites within the left lower abdominal quadrant. The left lower abdomen was prepped and draped in the usual sterile fashion. 1% lidocaine was used  for local anesthesia. A 19 gauge, 7-cm Teressa Lower was inserted with a strong fluid return but when attached to suction the flow stopped. Multiple attempts at manipulating the catheter were made without success. The catheter was removed. A 6 French Safe-T-Centesis was then inserted with expected results. An ultrasound image was saved for documentation purposes. The paracentesis was performed. The catheter was removed and a dressing was applied. The patient tolerated the procedure well without immediate post procedural complication. FINDINGS: A total of approximately 3 L of light pink fluid was removed. IMPRESSION: Successful ultrasound-guided paracentesis yielding 3 liters of peritoneal fluid. Read by: Soyla Dryer, NP Electronically Signed   By: Markus Daft M.D.   On: 01/10/2020 17:15   IR Paracentesis  Result Date: 01/03/2020 INDICATION: Cirrhosis with recurrent ascites. Request for diagnostic and therapeutic paracentesis. EXAM: ULTRASOUND GUIDED PARACENTESIS MEDICATIONS: 1% lidocaine 15 mL COMPLICATIONS: None immediate. PROCEDURE: Informed written consent was obtained from the patient after a discussion of the risks, benefits and alternatives to treatment. A timeout was performed prior to the initiation of the procedure. Initial ultrasound scanning demonstrates a moderate amount of ascites within the left lower abdominal quadrant. The left lower abdomen was prepped and draped in the usual sterile fashion. 1% lidocaine was used for local anesthesia. Following this, a 19 gauge, 7-cm, Yueh catheter was introduced. An ultrasound image was saved for documentation purposes. The paracentesis was performed. The catheter was removed and a dressing was applied. The patient tolerated the procedure well without immediate post procedural complication. FINDINGS: A total of approximately 2.9 L of red fluid was removed. Samples were sent to the laboratory as requested by the clinical team. IMPRESSION: Successful ultrasound-guided  paracentesis yielding 2.9 liters of peritoneal fluid. Read by: Gareth Eagle, PA-C Electronically Signed   By: Markus Daft M.D.   On: 01/03/2020 14:33     Phillips Climes M.D on 01/26/2020 at 4:37 PM    [facility] -  Office  [phone]

## 2020-01-27 DIAGNOSIS — I4891 Unspecified atrial fibrillation: Secondary | ICD-10-CM

## 2020-01-27 DIAGNOSIS — J441 Chronic obstructive pulmonary disease with (acute) exacerbation: Principal | ICD-10-CM

## 2020-01-27 LAB — CBC
HCT: 34.8 % — ABNORMAL LOW (ref 39.0–52.0)
Hemoglobin: 12 g/dL — ABNORMAL LOW (ref 13.0–17.0)
MCH: 27.2 pg (ref 26.0–34.0)
MCHC: 34.5 g/dL (ref 30.0–36.0)
MCV: 78.9 fL — ABNORMAL LOW (ref 80.0–100.0)
Platelets: 229 10*3/uL (ref 150–400)
RBC: 4.41 MIL/uL (ref 4.22–5.81)
RDW: 15.5 % (ref 11.5–15.5)
WBC: 18.5 10*3/uL — ABNORMAL HIGH (ref 4.0–10.5)
nRBC: 0 % (ref 0.0–0.2)

## 2020-01-27 LAB — MRSA PCR SCREENING: MRSA by PCR: NEGATIVE

## 2020-01-27 LAB — POTASSIUM
Potassium: 6.4 mmol/L (ref 3.5–5.1)
Potassium: 4.4 mmol/L (ref 3.5–5.1)

## 2020-01-27 LAB — GLUCOSE, CAPILLARY
Glucose-Capillary: 179 mg/dL — ABNORMAL HIGH (ref 70–99)
Glucose-Capillary: 186 mg/dL — ABNORMAL HIGH (ref 70–99)
Glucose-Capillary: 162 mg/dL — ABNORMAL HIGH (ref 70–99)

## 2020-01-27 LAB — COMPREHENSIVE METABOLIC PANEL
ALT: 63 U/L — ABNORMAL HIGH (ref 0–44)
AST: 58 U/L — ABNORMAL HIGH (ref 15–41)
Albumin: 3 g/dL — ABNORMAL LOW (ref 3.5–5.0)
Alkaline Phosphatase: 380 U/L — ABNORMAL HIGH (ref 38–126)
Anion gap: 17 — ABNORMAL HIGH (ref 5–15)
BUN: 54 mg/dL — ABNORMAL HIGH (ref 6–20)
CO2: 26 mmol/L (ref 22–32)
Calcium: 10.1 mg/dL (ref 8.9–10.3)
Chloride: 85 mmol/L — ABNORMAL LOW (ref 98–111)
Creatinine, Ser: 5.7 mg/dL — ABNORMAL HIGH (ref 0.61–1.24)
GFR calc Af Amer: 12 mL/min — ABNORMAL LOW (ref >60)
GFR calc non Af Amer: 10 mL/min — ABNORMAL LOW (ref >60)
Glucose, Bld: 167 mg/dL — ABNORMAL HIGH (ref 70–99)
Potassium: 6.8 mmol/L (ref 3.5–5.1)
Sodium: 128 mmol/L — ABNORMAL LOW (ref 135–145)
Total Bilirubin: 1.2 mg/dL (ref 0.3–1.2)
Total Protein: 7.5 g/dL (ref 6.5–8.1)

## 2020-01-27 LAB — MAGNESIUM: Magnesium: 2.7 mg/dL — ABNORMAL HIGH (ref 1.7–2.4)

## 2020-01-27 MED ORDER — DIPHENHYDRAMINE HCL 12.5 MG/5ML PO ELIX
25.0000 mg | ORAL_SOLUTION | Freq: Once | ORAL | Status: AC
  Administered 2020-01-27: 25 mg via ORAL
  Filled 2020-01-27: qty 10

## 2020-01-27 MED ORDER — PROMETHAZINE HCL 25 MG PO TABS: 12.5000 mg | ORAL_TABLET | Freq: Once | ORAL | Status: AC

## 2020-01-27 MED ORDER — DIPHENHYDRAMINE HCL 25 MG PO CAPS: 25.0000 mg | ORAL_CAPSULE | Freq: Once | ORAL | Status: AC

## 2020-01-27 MED ORDER — DILTIAZEM HCL ER COATED BEADS 120 MG PO CP24
120.0000 mg | ORAL_CAPSULE | Freq: Every day | ORAL | Status: DC
  Administered 2020-01-27: 120 mg via ORAL
  Filled 2020-01-27: qty 1

## 2020-01-27 MED ORDER — LORAZEPAM 0.5 MG PO TABS
ORAL_TABLET | ORAL | Status: AC
  Filled 2020-01-27: qty 1

## 2020-01-27 MED ORDER — DILTIAZEM HCL-DEXTROSE 125-5 MG/125ML-% IV SOLN (PREMIX)
5.0000 mg/h | INTRAVENOUS | Status: DC
  Administered 2020-01-28 (×3): 7.5 mg/h via INTRAVENOUS
  Filled 2020-01-27 (×2): qty 125

## 2020-01-27 MED ORDER — SEVELAMER CARBONATE 800 MG PO TABS
800.0000 mg | ORAL_TABLET | Freq: Three times a day (TID) | ORAL | Status: DC
  Administered 2020-01-27 – 2020-01-28 (×4): 800 mg via ORAL
  Filled 2020-01-27 (×3): qty 1

## 2020-01-27 MED ORDER — LORAZEPAM 0.5 MG PO TABS
ORAL_TABLET | ORAL | Status: AC
  Administered 2020-01-27: 0.5 mg via ORAL
  Filled 2020-01-27: qty 1

## 2020-01-27 MED ORDER — DIPHENHYDRAMINE HCL 25 MG PO CAPS
ORAL_CAPSULE | ORAL | Status: AC
  Administered 2020-01-27: 25 mg via ORAL
  Filled 2020-01-27: qty 1

## 2020-01-27 MED ORDER — DEXTROSE 50 % IV SOLN
1.0000 | Freq: Once | INTRAVENOUS | Status: AC
  Administered 2020-01-27: 50 mL via INTRAVENOUS
  Filled 2020-01-27: qty 50

## 2020-01-27 MED ORDER — METHYLPREDNISOLONE SODIUM SUCC 125 MG IJ SOLR
60.0000 mg | Freq: Two times a day (BID) | INTRAMUSCULAR | Status: DC
  Administered 2020-01-28: 60 mg via INTRAVENOUS
  Filled 2020-01-27: qty 2

## 2020-01-27 MED ORDER — CALCIUM GLUCONATE-NACL 1-0.675 GM/50ML-% IV SOLN
1.0000 g | Freq: Once | INTRAVENOUS | Status: AC
  Administered 2020-01-27: 1000 mg via INTRAVENOUS
  Filled 2020-01-27: qty 50

## 2020-01-27 MED ORDER — SODIUM POLYSTYRENE SULFONATE 15 GM/60ML PO SUSP: 45.0000 g | Freq: Once | ORAL | Status: DC

## 2020-01-27 MED ORDER — INSULIN ASPART 100 UNIT/ML IV SOLN
5.0000 [IU] | Freq: Once | INTRAVENOUS | Status: AC
  Administered 2020-01-27: 5 [IU] via INTRAVENOUS

## 2020-01-27 MED ORDER — CHLORHEXIDINE GLUCONATE CLOTH 2 % EX PADS: 6.0000 | MEDICATED_PAD | Freq: Every day | CUTANEOUS | Status: DC

## 2020-01-27 MED ORDER — SODIUM POLYSTYRENE SULFONATE 15 GM/60ML PO SUSP
45.0000 g | ORAL | Status: AC
  Administered 2020-01-27: 45 g via ORAL
  Filled 2020-01-27: qty 180

## 2020-01-27 MED ORDER — PROMETHAZINE HCL 25 MG PO TABS
ORAL_TABLET | ORAL | Status: AC
  Administered 2020-01-27: 12.5 mg via ORAL
  Filled 2020-01-27: qty 1

## 2020-01-27 NOTE — Procedures (Signed)
I was present at this dialysis session. I have reviewed the session itself and made appropriate changes.     Recent Labs  Lab 01/25/20 2218 01/26/20 1146 01/27/20 0557  NA   < >  --  128*  K   < >  --  6.8*  CL   < >  --  85*  CO2   < >  --  26  GLUCOSE   < >  --  167*  BUN   < >  --  54*  CREATININE   < >  --  5.70*  CALCIUM   < >  --  10.1  PHOS  --  6.8*  --    < > = values in this interval not displayed.    Recent Labs  Lab 01/20/20 1543 01/25/20 2218 01/27/20 0557  WBC 15.6* 12.0* 18.5*  NEUTROABS  --  8.8*  --   HGB 11.6* 12.2* 12.0*  HCT 34.0* 35.6* 34.8*  MCV 80.4 80.0 78.9*  PLT 210 232 229    Scheduled Meds: . Chlorhexidine Gluconate Cloth  6 each Topical Q0600  . gabapentin  100 mg Oral BID  . heparin  5,000 Units Subcutaneous Q8H  . insulin aspart  0-6 Units Subcutaneous Q4H  . ipratropium-albuterol  3 mL Nebulization Once  . methylPREDNISolone (SOLU-MEDROL) injection  60 mg Intravenous Q8H  . sevelamer carbonate  800 mg Oral TID WC   Continuous Infusions: . diltiazem (CARDIZEM) infusion 5 mg/hr (01/27/20 1111)   PRN Meds:.albuterol, guaiFENesin-dextromethorphan, HYDROmorphone (DILAUDID) injection, ipratropium-albuterol, LORazepam   Pearson Grippe  MD 01/27/2020, 12:12 PM

## 2020-01-27 NOTE — Significant Event (Addendum)
HOSPITAL MEDICINE OVERNIGHT EVENT NOTE  Notified by nursing patient suddenly developed a tachyarrythmia while moving his bowels on the toilet.  Telemetry revealed heart rates in excess of 170.  ECG revealed likely atrial flutter with known bundle branch block.  Case already discussed with Dr. Virgina Jock by nursing who recommended restarting Diltiazem infusion.    Patient evaluated at the bedside, heart rate currently between 110's and 120's and currently in atrial fibrillation on telemetry.  BP stable.  Patient denies chest pain or shortness of breath.    I advised nursing to titrate Diltiazem to try achieve heart rates below 100, monitoring for hypotension.  Will montior.  Sherryll Burger Markiya Keefe

## 2020-01-27 NOTE — Consult Note (Signed)
[facility] Renal Consultation Note    Indication for Consultation:  Management of ESRD/hemodialysis; anemia, hypertension/volume and secondary hyperparathyroidism   HPI: [name] is a 59 y.o. male with ESRD on HD MWF at [facility]. Marland Kitchen PMH also HTN, Chronic hepatitis C with cirrhosis, recurrent ascites, COPD, Hx Afib/AFlutter not on anticoagulation, chronic pain, medical noncompliance. His last dialysis was Friday 7/16. He completed 3 of 4 hour treatment and left 2.8 kg over his dry weight.   He was admitted with acute on chronic respiratory failure and Aflutter with RVR. He has been evaluated by cardiology. CXR show pulm edema.  Critical lab this am --potassium 6.8. He has received calcium gluconate, insulin, dextrose. Nephrology consulted for urgent dialysis.   Seen and examined at bedside. He is breathing comfortably on RA. He has chronic abd pain/ascites. He reports recent paracentesis on Thursday. He denies cp, sob, n/v. He is making demands about the amount of dialysis he will do today.   Past Medical History:  Diagnosis Date  . Anemia   . Anxiety   . Arthritis    left shoulder  . Atherosclerosis of aorta ([place])   . Cardiomegaly   . Chest pain    DATE UNKNOWN, C/O PERIODICALLY  . Cocaine abuse ([place])   . COPD exacerbation ([place]) 08/17/2016  . Coronary artery disease    stent 02/22/17  . ESRD (end stage renal disease) on dialysis ([place])    "[name]; MWF" (07/04/2017)  . GERD (gastroesophageal reflux disease)    DATE UNKNOWN  . Hemorrhoids   . Hepatitis B, chronic ([place])   . Hepatitis C   . History of kidney stones   . Hyperkalemia   . Hypertension   . Kidney failure   . Metabolic bone disease    Patient denies  . Mitral stenosis   . Myocardial infarction ([employer])   . Pneumonia   . Pulmonary edema   . Solitary rectal ulcer syndrome 07/2017   at flex sig for rectal bleeding  . Tubular adenoma of colon    Past Surgical History:   Procedure Laterality Date  . A/V FISTULAGRAM Left 05/26/2017   Procedure: A/V FISTULAGRAM;  Surgeon: Conrad Country Homes, MD;  Location: [place] [facility];  Service: Cardiovascular;  Laterality: Left;  . A/V FISTULAGRAM Right 11/18/2017   Procedure: A/V FISTULAGRAM - Right Arm;  Surgeon: Elam Dutch, MD;  Location: [place] [facility];  Service: Cardiovascular;  Laterality: Right;  . APPLICATION OF WOUND VAC Left 06/14/2017   Procedure: APPLICATION OF WOUND VAC;  Surgeon: Katha Cabal, MD;  Location: [facility] ORS;  Service: Vascular;  Laterality: Left;  . AV FISTULA PLACEMENT  2012   BELIEVED WAS PLACED IN JUNE  . AV FISTULA PLACEMENT Right 08/09/2017   Procedure: Creation Right arm ARTERIOVENOUS BRACHIOCEPOHALIC FISTULA;  Surgeon: Elam Dutch, MD;  Location: [facility] OR;  Service: Vascular;  Laterality: Right;  . AV FISTULA PLACEMENT Right 11/22/2017   Procedure: INSERTION OF ARTERIOVENOUS (AV) GORE-TEX GRAFT RIGHT UPPER ARM;  Surgeon: Elam Dutch, MD;  Location: [place];  Service: Vascular;  Laterality: Right;  . BIOPSY  01/25/2018   Procedure: BIOPSY;  Surgeon: Jerene Bears, MD;  Location: [place];  Service: Gastroenterology;;  . BIOPSY  04/10/2019   Procedure: BIOPSY;  Surgeon: Jerene Bears, MD;  Location: [facility];  Service: Gastroenterology;;  . COLONOSCOPY    . COLONOSCOPY WITH PROPOFOL N/A 01/25/2018   Procedure: COLONOSCOPY WITH PROPOFOL;  Surgeon: Jerene Bears, MD;  Location: [facility]COPY;  Service: Gastroenterology;  Laterality: N/A;  . CORONARY STENT INTERVENTION N/A 02/22/2017   Procedure: CORONARY STENT INTERVENTION;  Surgeon: Nigel Mormon, MD;  Location: [place] [facility];  Service: Cardiovascular;  Laterality: N/A;  . ESOPHAGOGASTRODUODENOSCOPY (EGD) WITH PROPOFOL N/A 01/25/2018   Procedure: ESOPHAGOGASTRODUODENOSCOPY (EGD) WITH PROPOFOL;  Surgeon: Jerene Bears, MD;  Location: [place];  Service: Gastroenterology;  Laterality: N/A;  .  ESOPHAGOGASTRODUODENOSCOPY (EGD) WITH PROPOFOL N/A 04/10/2019   Procedure: ESOPHAGOGASTRODUODENOSCOPY (EGD) WITH PROPOFOL;  Surgeon: Jerene Bears, MD;  Location: [facility];  Service: Gastroenterology;  Laterality: N/A;  . FLEXIBLE SIGMOIDOSCOPY N/A 07/15/2017   Procedure: FLEXIBLE SIGMOIDOSCOPY;  Surgeon: Carol Ada, MD;  Location: [place];  Service: Endoscopy;  Laterality: N/A;  . HEMORRHOID BANDING    . I & D EXTREMITY Left 06/01/2017   Procedure: IRRIGATION AND DEBRIDEMENT LEFT ARM HEMATOMA WITH LIGATION OF LEFT ARM AV FISTULA;  Surgeon: Elam Dutch, MD;  Location: [place];  Service: Vascular;  Laterality: Left;  . I & D EXTREMITY Left 06/14/2017   Procedure: IRRIGATION AND DEBRIDEMENT EXTREMITY;  Surgeon: Katha Cabal, MD;  Location: [facility] ORS;  Service: Vascular;  Laterality: Left;  . INSERTION OF DIALYSIS CATHETER  05/30/2017  . INSERTION OF DIALYSIS CATHETER N/A 05/30/2017   Procedure: INSERTION OF DIALYSIS CATHETER;  Surgeon: Elam Dutch, MD;  Location: [place];  Service: Vascular;  Laterality: N/A;  . IR PARACENTESIS  08/30/2017  . IR PARACENTESIS  09/29/2017  . IR PARACENTESIS  10/28/2017  . IR PARACENTESIS  11/09/2017  . IR PARACENTESIS  11/16/2017  . IR PARACENTESIS  11/28/2017  . IR PARACENTESIS  12/01/2017  . IR PARACENTESIS  12/06/2017  . IR PARACENTESIS  01/03/2018  . IR PARACENTESIS  01/23/2018  . IR PARACENTESIS  02/07/2018  . IR PARACENTESIS  02/21/2018  . IR PARACENTESIS  03/06/2018  . IR PARACENTESIS  03/17/2018  . IR PARACENTESIS  04/04/2018  . IR PARACENTESIS  12/28/2018  . IR PARACENTESIS  01/08/2019  . IR PARACENTESIS  01/23/2019  . IR PARACENTESIS  02/01/2019  . IR PARACENTESIS  02/19/2019  . IR PARACENTESIS  03/01/2019  . IR PARACENTESIS  03/15/2019  . IR PARACENTESIS  04/03/2019  . IR PARACENTESIS  04/12/2019  . IR PARACENTESIS  05/01/2019  . IR PARACENTESIS  05/08/2019  . IR PARACENTESIS  05/24/2019  . IR PARACENTESIS  06/12/2019  . IR PARACENTESIS   07/09/2019  . IR PARACENTESIS  07/27/2019  . IR PARACENTESIS  08/09/2019  . IR PARACENTESIS  08/21/2019  . IR PARACENTESIS  09/17/2019  . IR PARACENTESIS  10/05/2019  . IR PARACENTESIS  10/29/2019  . IR PARACENTESIS  11/08/2019  . IR PARACENTESIS  12/12/2019  . IR PARACENTESIS  01/03/2020  . IR PARACENTESIS  01/10/2020  . IR PARACENTESIS  01/17/2020  . IR PARACENTESIS  01/24/2020  . IR RADIOLOGIST EVAL & MGMT  02/14/2018  . IR RADIOLOGIST EVAL & MGMT  02/22/2019  . LEFT HEART CATH AND CORONARY ANGIOGRAPHY N/A 02/22/2017   Procedure: LEFT HEART CATH AND CORONARY ANGIOGRAPHY;  Surgeon: Nigel Mormon, MD;  Location: [place] [facility];  Service: Cardiovascular;  Laterality: N/A;  . LIGATION OF ARTERIOVENOUS  FISTULA Left 0/0/7622   Procedure: Plication of Left Arm Arteriovenous Fistula;  Surgeon: Elam Dutch, MD;  Location: [place];  Service: Vascular;  Laterality: Left;  . POLYPECTOMY    . POLYPECTOMY  01/25/2018   Procedure:  POLYPECTOMY;  Surgeon: Jerene Bears, MD;  Location: [facility] ENDOSCOPY;  Service: Gastroenterology;;  . REVISON OF ARTERIOVENOUS FISTULA Left 2/95/2841   Procedure: PLICATION OF DISTAL ANEURYSMAL SEGEMENT OF LEFT UPPER ARM ARTERIOVENOUS FISTULA;  Surgeon: Elam Dutch, MD;  Location: [place];  Service: Vascular;  Laterality: Left;  . REVISON OF ARTERIOVENOUS FISTULA Left 10/02/4008   Procedure: Plication of Left Upper Arm Fistula ;  Surgeon: Waynetta Sandy, MD;  Location: [place];  Service: Vascular;  Laterality: Left;  . SKIN GRAFT SPLIT THICKNESS LEG / FOOT Left    SKIN GRAFT SPLIT THICKNESS LEFT ARM DONOR SITE: LEFT ANTERIOR THIGH  . SKIN SPLIT GRAFT Left 07/04/2017   Procedure: SKIN GRAFT SPLIT THICKNESS LEFT ARM DONOR SITE: LEFT ANTERIOR THIGH;  Surgeon: Elam Dutch, MD;  Location: [place];  Service: Vascular;  Laterality: Left;  . THROMBECTOMY W/ EMBOLECTOMY Left 06/05/2017   Procedure: EXPLORATION OF LEFT ARM FOR BLEEDING; OVERSEWED PROXIMAL FISTULA;  Surgeon:  Angelia Mould, MD;  Location: [place];  Service: Vascular;  Laterality: Left;  . WOUND EXPLORATION Left 06/03/2017   Procedure: WOUND EXPLORATION WITH WOUND VAC APPLICATION TO LEFT ARM;  Surgeon: Angelia Mould, MD;  Location: [facility] OR;  Service: Vascular;  Laterality: Left;   Family History  Problem Relation Age of Onset  . Heart disease Mother   . Lung cancer Mother   . Heart disease Father   . Malignant hyperthermia Father   . COPD Father   . Throat cancer Sister   . Esophageal cancer Sister   . Hypertension Other   . COPD Other   . Colon cancer Neg Hx   . Colon polyps Neg Hx   . Rectal cancer Neg Hx   . Stomach cancer Neg Hx    Social History:  reports that he has been smoking cigarettes. He started smoking about 46 years ago. He has a 21.50 pack-year smoking history. He has never used smokeless tobacco. He reports previous alcohol use. He reports previous drug use. Drugs: Marijuana and Cocaine. Allergies  Allergen Reactions  . Tramadol Itching and Other (See Comments)  . Morphine And Related Other (See Comments)    Stomach pain  . Pollen Extract     Other reaction(s): Sneezing (finding)  . Acetaminophen Nausea Only    Stomach ache Other reaction(s): Other (See Comments) Stomach ache  . Aspirin Other (See Comments) and Itching    STOMACH PAIN Other reaction(s): Unknown STOMACH PAIN  . Clonidine Derivatives Itching   Prior to Admission medications   Medication Sig Start Date End Date Taking? Authorizing Provider  gabapentin (NEURONTIN) 300 MG capsule Take 1 capsule (300 mg total) by mouth 2 (two) times daily. 01/23/20  Yes Adrian Prows, MD  Oxycodone HCl 10 MG TABS Take 10 mg by mouth 3 (three) times daily as needed. 01/24/20  Yes [provider]  sevelamer carbonate (RENVELA) 800 MG tablet Take 800 mg by mouth 3 (three) times daily with meals.   Yes [provider]  tenofovir (VIREAD) 300 MG tablet Take 300 mg by mouth once a week. 01/03/20   Yes [provider]   Current Facility-Administered Medications  Medication Dose Route Frequency Provider Last Rate Last Admin  . albuterol (PROVENTIL) (2.5 MG/3ML) 0.083% nebulizer solution 2.5 mg  2.5 mg Nebulization Q4H PRN Blenda Nicely, RPH      . Chlorhexidine Gluconate Cloth 2 % PADS 6 each  6 each Topical Q0600 Lynnda Child, PA-C      .  diltiazem (CARDIZEM) 125 mg in dextrose 5% 125 mL (1 mg/mL) infusion  5-20 mg/hr Intravenous Titrated Patwardhan, Manish J, MD 7.5 mL/hr at 01/27/20 1023 7.5 mg/hr at 01/27/20 1023  . gabapentin (NEURONTIN) capsule 100 mg  100 mg Oral BID Chauncey Mann, MD   100 mg at 01/27/20 0806  . guaiFENesin-dextromethorphan (ROBITUSSIN DM) 100-10 MG/5ML syrup 5 mL  5 mL Oral Q4H PRN Elgergawy, Silver Huguenin, MD   5 mL at 01/27/20 0554  . heparin injection 5,000 Units  5,000 Units Subcutaneous Q8H Fair, Chelsea N, MD      . HYDROmorphone (DILAUDID) injection 0.5 mg  0.5 mg Intravenous Q4H PRN Fair, Marin Shutter, MD   0.5 mg at 01/27/20 1022  . insulin aspart (novoLOG) injection 0-6 Units  0-6 Units Subcutaneous Q4H Chauncey Mann, MD   1 Units at 01/27/20 [phone]  . ipratropium-albuterol (DUONEB) 0.5-2.5 (3) MG/3ML nebulizer solution 3 mL  3 mL Nebulization Once Fair, Chelsea N, MD      . ipratropium-albuterol (DUONEB) 0.5-2.5 (3) MG/3ML nebulizer solution 3 mL  3 mL Nebulization Q6H PRN Fair, Marin Shutter, MD      . LORazepam (ATIVAN) tablet 0.5 mg  0.5 mg Oral Q6H PRN Elgergawy, Silver Huguenin, MD   0.5 mg at 01/27/20 0213  . methylPREDNISolone sodium succinate (SOLU-MEDROL) 125 mg/2 mL injection 60 mg  60 mg Intravenous Q8H Fair, Marin Shutter, MD   60 mg at 01/27/20 0615  . sevelamer carbonate (RENVELA) tablet 800 mg  800 mg Oral TID WC Elgergawy, Silver Huguenin, MD   800 mg at 01/27/20 0806     ROS: As per HPI otherwise negative.  Physical Exam: Vitals:   01/26/20 1609 01/26/20 1723 01/26/20 2001 01/26/20 2319  BP: 116/89 133/82 128/74 115/68  Pulse:   77 80   Resp:  20 16 17   Temp:  98.3 F (36.8 C) 98.5 F (36.9 C) 98.1 F (36.7 C)  TempSrc:  Oral Oral Oral  SpO2:  95% 98% 92%     General: Ill appearing, lying in bed, nad  Head: NCAT sclera not icteric MMM Neck: Supple. JVD+  Lungs: + rales, bibasilar  Heart:  Irregular, holosystolic murmur  Abdomen: +ascites, distended, non-tender  Lower extremities:without edema or ischemic changes, no open wounds  Neuro: A & O  X 3. Moves all extremities spontaneously. Psych:  Responds to questions appropriately with a normal affect. Dialysis Access: RUE AVG +bruit   Labs: Basic Metabolic Panel: Recent Labs  Lab 01/20/20 1543 01/20/20 1543 01/25/20 2218 01/26/20 0221 01/26/20 1146 01/27/20 0557  NA 132*  --  133*  --   --  128*  K 4.5   < > 5.9* 5.2*  --  6.8*  CL 89*  --  89*  --   --  85*  CO2 27  --  29  --   --  26  GLUCOSE 171*  --  102*  --   --  167*  BUN 36*  --  28*  --   --  54*  CREATININE 4.36*  --  3.94*  --   --  5.70*  CALCIUM 9.5  --  9.5  --   --  10.1  PHOS  --   --   --   --  6.8*  --    < > = values in this interval not displayed.   Liver Function Tests: Recent Labs  Lab 01/25/20 2218 01/27/20 0557  AST 73* 58*  ALT 64*  63*  ALKPHOS 364* 380*  BILITOT 1.0 1.2  PROT 6.5 7.5  ALBUMIN 2.6* 3.0*   No results for input(s): LIPASE, AMYLASE in the last 168 hours. No results for input(s): AMMONIA in the last 168 hours. CBC: Recent Labs  Lab 01/20/20 1543 01/25/20 2218 01/27/20 0557  WBC 15.6* 12.0* 18.5*  NEUTROABS  --  8.8*  --   HGB 11.6* 12.2* 12.0*  HCT 34.0* 35.6* 34.8*  MCV 80.4 80.0 78.9*  PLT 210 232 229   Cardiac Enzymes: No results for input(s): CKTOTAL, CKMB, CKMBINDEX, TROPONINI in the last 168 hours. CBG: Recent Labs  Lab 01/26/20 1302 01/26/20 1708 01/26/20 2024 01/26/20 2311 01/27/20 0608  GLUCAP 152* 113* 160* 186* 179*   Iron Studies: No results for input(s): IRON, TIBC, TRANSFERRIN, FERRITIN in the last 72  hours. Studies/Results: DG Chest Port 1 View  Result Date: 01/25/2020 CLINICAL DATA:  Shortness of breath, end-stage renal disease, hepatitis, cirrhosis EXAM: PORTABLE CHEST 1 VIEW COMPARISON:  01/20/2020 FINDINGS: Single frontal view of the chest demonstrates persistent enlargement of the cardiac silhouette. Diffuse increased interstitial prominence since prior study likely reflects interstitial edema. No airspace disease, effusion, or pneumothorax. No acute bony abnormalities. IMPRESSION: 1. Increased interstitial prominence compatible with interstitial edema. Electronically Signed   By: Randa Ngo M.D.   On: 01/25/2020 22:52    Dialysis Orders:  MWF [place],4h EDW 61kg2/2 bath 450/800 R arm AVG Hep none Calcitriol 0.75 TIW    Assessment/Plan: 1. ESRD/ Hyperkalemia - HD MWF. Last dialysis 7/16. Frequent short treatments. Will correct with urgent dialysis today. Dialysis RN has been notified of orders.  2. Acute on chronic resp failure -  Exacerbated by volume overload. Volume removal with dialysis today  3. AFlutter - Cardiology following --on diltazem gtt.  4. Cirrhosis w/recurrent ascites. - last paracentesis 7/15.  5. Hypertension - BP low/stable.  6. Anemia  - Hgb >12. No ESA needs.  7. Metabolic bone disease -  Ca 10.1 Continue binders today. Follow trends.    Lynnda Child PA-C [facility] 01/27/2020, 10:44 AM

## 2020-01-27 NOTE — Progress Notes (Signed)
OT Cancellation Note  Patient Details Name: [name] MRN: [id number] DOB: 1960/11/29   Cancelled Treatment:    Reason Eval/Treat Not Completed: OT screened, no needs identified, will sign off (Pt modified independent with ADL and mobility. No OT needs.)  Per PT, pt was ambulatory in room with no physical assist and performing own ADLs in standing at sink/at commode. OT signing off. Thank you.  Jefferey Pica, OTR/L Acute Rehabilitation Services Pager: [phone] Office: [phone]   Frances Joynt C 01/27/2020, 10:24 AM

## 2020-01-27 NOTE — Progress Notes (Deleted)
Received call from central telemetry that patient's HR sustaining in 170s. This RN and Buyer, retail immediately went to patient's room. Patient found to be in bathroom having a BM. Patient assisted back to bed. Patient complaining of jaw pain. O2 placed on patient, EKG obtained, Zoll pads placed on patient. Rapid response, Dr. Virgina Jock, and on call MD for [facility] notified via phone. Dr. Virgina Jock reviewed EKG and felt that it likely represented atrial flutter with RVR & bundle branch block. Of note, EKG interpretation states possible arm lead reversal, but lead placement was verified by two RNs and found to be accurate. Dr. Virgina Jock gave verbal order to resume IV cardizem drip. IV cardizem initiated at 5 mg/hr. Patient HR currently 110s-120s, BP 126/92. Patient states he feels much better.

## 2020-01-27 NOTE — Progress Notes (Signed)
Received call from central telemetry that patient's HR sustaining in 170s. This RN and Buyer, retail immediately went to patient's room. Patient found to be in bathroom having a BM. Patient assisted back to bed. Patient complaining of jaw pain. O2 placed on patient, EKG obtained, Zoll pads placed on patient. Rapid response, Dr. Virgina Jock, and on call MD for [facility] notified via phone. Dr. Virgina Jock reviewed EKG and felt that it likely represented atrial flutter with RVR & bundle branch block. Of note, EKG interpretation states possible arm lead reversal, but lead placement was verified by two RNs and found to be accurate. Dr. Virgina Jock gave verbal order to resume IV cardizem drip. IV cardizem initiated at 5 mg/hr. Patient HR currently 110s-120s, BP 126/92. Patient states he feels much better.

## 2020-01-27 NOTE — Progress Notes (Signed)
Paged on call provider 7/17 ~ 23:00 & 7/18 ~ 6:00 regarding need for sevelamer 800 mg TID. Per order this med needs to be reviewed with attending.

## 2020-01-27 NOTE — Progress Notes (Addendum)
PROGRESS NOTE                                                                                                                                                                                                             Patient Demographics:    [name], is a 59 y.o. male, DOB - 1960-09-11, DTO:[id number]  Admit date - 01/25/2020   Admitting Physician Chauncey Mann, MD  Outpatient Primary MD for the patient is [name], FNP  LOS - 1   Chief Complaint  Patient presents with  . Atrial Fibrillation       Brief Narrative     [name] is a 59 y.o. male with PMH of ESRD, CAD, PAF, cirrhosis, hep B & C, EtOH abuse, COPD, and tobacco abuse presented to ED with SOB and tachycardia and admitted for COPD exacerbation and a flutter with RVR.  He was seen by cardiology, initially amiodarone drip, he has been transitioned to Cardizem, sinus rhythm overnight, dyspnea and hypoxia significantly improved with IV steroids, renal were consulted for hyperkalemia of 6.8 today, he is not due for dialysis till tomorrow, but he will be dialyzed today given his hyperkalemia.   Subjective:    [name] today reports dyspnea has improved, still reporting some cough.   Assessment  & Plan :    Principal Problem:   Acute respiratory failure with hypoxia (HCC) Active Problems:   Alcohol abuse   Nicotine dependence, cigarettes, uncomplicated   Chronic hepatitis B (HCC)   Chronic hepatitis C without hepatic coma (HCC)   COPD exacerbation (HCC)   Essential hypertension   Acute on chronic respiratory failure with hypoxia (HCC)   Arrhythmia   COPD (chronic obstructive pulmonary disease) (HCC)   Hyperkalemia   Atypical atrial flutter (HCC)   ESRD on dialysis ([place])   Cirrhosis of liver with ascites (HCC)   QT prolongation   Tobacco use   Respiratory failure with hypoxia - COPD exacerbation: Acute on chronic. Patient presents with  complaints of shortness of breath, cough, increased sputum production and wheezing.  -  CXR: Increased interstitial prominence compatible with interstitial edema. -Patient significantly improving with IV steroids, and scheduled nebulizer treatment.  Patient had been given 125 mg of Solu-Medrol, magnesium sulfate, and inhalers.  -This morning remains on 2 L nasal cannula.  Paroxysmal  atrial fibrillation vs atrial flutter:  - Acute on chronic. Hypotensive with heart rate in 150's on admission. Not on anticoagulation due to cirrhosis of the liver and issues with none compliance on warfarin per notes per Cards. -Amiodarone drip, transitioned to Cardizem drip yesterday, he converted to normal sinus rhythm, IV Cardizem switched to p.o. regimen. -Neurology has signed off  Cirrhosis of the liver with ascites: Acute on chronic. Prior history of cirrhosis secondary to a prior history of alcohol abuse along with hepatitis B and C. Paracentesis last done on 7/15. -Repeat paracentesis PRN -Daily MELD  Abdominal pain: -Acute on chronic. Patient reports continued abdominal pain that is unchanged from previous. Records note previous medications include oxycodone 10 mg as needed for pain.  Essential hypertension:  - Home blood pressure medications include metoprolol 100 mg twice daily and hydralazine 100 mg 2 times daily. -Blood pressure currently controlled on oral Cardizem, discontinue metoprolol and hydralazine on discharge and continue only with Cardizem.  Prolonged QT interval: Acute on chronic. QTc 583 on admission.Patient has long history of prolonged QT. -Continue to monitor on telemetry -Avoid QT prolonging meds as able   ESRD on HD: Last dialysis 7/16 and 1.5L removed.On physical exam patient does not appear to be grossly fluid overloaded. K 5.2. -This morning patient with hyperkalemia of 6.8, despite receiving Lokelma yesterday he did receive D50 with IV insulin, calcium gluconate,  and Kayexalate, I have discussed with renal, plan for hemodialysis of scheduled today   GERD: Patient without specific GERD complaints today. -Hold Protonix due to prolonged QT -GI cocktail can be ordered as needed for heartburn/indigestion  Tobacco use: Patient admits to smoking less than half pack cigarettes per day on average. Declines need of nicotine patch. -Counseled onneed of continued cessation of tobacco use   COVID-19 Labs  No results for input(s): DDIMER, FERRITIN, LDH, CRP in the last 72 hours.  Lab Results  Component Value Date   SARSCOV2NAA NEGATIVE 01/26/2020   [place] NEGATIVE 01/19/2020   [place] NEGATIVE 01/14/2020   [place] NEGATIVE 01/13/2020     Code Status : DNR  Family Communication  : None at bedside  Disposition Plan  :  Status is: Inpatient  Remains inpatient appropriate because:IV treatments appropriate due to intensity of illness or inability to take PO   Dispo: The patient is from: Home              Anticipated d/c is to: Home              Anticipated d/c date is: 2 days              Patient currently is not medically stable to d/c.         Consults  :  Cardiology, renal  Procedures  : none  DVT Prophylaxis  :   heparin  Lab Results  Component Value Date   PLT 229 01/27/2020    Antibiotics  :    Anti-infectives (From admission, onward)   Start     Dose/Rate Route Frequency Ordered Stop   01/26/20 0930  doxycycline (VIBRAMYCIN) 100 mg in sodium chloride 0.9 % 250 mL IVPB  Status:  Discontinued        100 mg 125 mL/hr over 120 Minutes Intravenous Every 12 hours 01/26/20 0928 01/27/20 0755        Objective:   Vitals:   01/27/20 1330 01/27/20 1400 01/27/20 1432 01/27/20 1546  BP: 105/63 104/63 (!) 111/57 134/81  Pulse: 98 100 90 95  Resp:   (!) 21 20  Temp:   97.6 F (36.4 C) 97.8 F (36.6 C)  TempSrc:   Oral Oral  SpO2:   95% 95%  Weight:   66.9 kg     Wt Readings from Last 3 Encounters:    01/27/20 66.9 kg  01/19/20 61.2 kg  01/14/20 61.2 kg     Intake/Output Summary (Last 24 hours) at 01/27/2020 1558 Last data filed at 01/27/2020 1411 Gross per 24 hour  Intake 1005.42 ml  Output 2140 ml  Net -1134.58 ml     Physical Exam  Awake Alert, Oriented X 3, No new F.N deficits, Normal affect Symmetrical Chest wall movement, Good air movement bilaterally, scattered wheezing today RRR,No Gallops,Rubs or new Murmurs, No Parasternal Heave +ve B.Sounds, Abd Soft, No tenderness, No rebound - guarding or rigidity. No Cyanosis, Clubbing or edema, No new Rash or bruise      Data Review:    CBC Recent Labs  Lab 01/25/20 2218 01/27/20 0557  WBC 12.0* 18.5*  HGB 12.2* 12.0*  HCT 35.6* 34.8*  PLT 232 229  MCV 80.0 78.9*  MCH 27.4 27.2  MCHC 34.3 34.5  RDW 15.8* 15.5  LYMPHSABS 1.3  --   MONOABS 1.5*  --   EOSABS 0.2  --   BASOSABS 0.1  --     Chemistries  Recent Labs  Lab 01/25/20 2218 01/26/20 0221 01/26/20 1146 01/27/20 0557 01/27/20 1208 01/27/20 1429  NA 133*  --   --  128*  --   --   K 5.9* 5.2*  --  6.8* 6.4* 4.4  CL 89*  --   --  85*  --   --   CO2 29  --   --  26  --   --   GLUCOSE 102*  --   --  167*  --   --   BUN 28*  --   --  54*  --   --   CREATININE 3.94*  --   --  5.70*  --   --   CALCIUM 9.5  --   --  10.1  --   --   MG  --   --  2.8* 2.7*  --   --   AST 73*  --   --  58*  --   --   ALT 64*  --   --  63*  --   --   ALKPHOS 364*  --   --  380*  --   --   BILITOT 1.0  --   --  1.2  --   --    ------------------------------------------------------------------------------------------------------------------ No results for input(s): CHOL, HDL, LDLCALC, TRIG, CHOLHDL, LDLDIRECT in the last 72 hours.  Lab Results  Component Value Date   HGBA1C 4.8 01/26/2020   ------------------------------------------------------------------------------------------------------------------ No results for input(s): TSH, T4TOTAL, T3FREE, THYROIDAB in the  last 72 hours.  Invalid input(s): FREET3 ------------------------------------------------------------------------------------------------------------------ No results for input(s): VITAMINB12, FOLATE, FERRITIN, TIBC, IRON, RETICCTPCT in the last 72 hours.  Coagulation profile Recent Labs  Lab 01/26/20 1146  INR 1.1    No results for input(s): DDIMER in the last 72 hours.  Cardiac Enzymes No results for input(s): CKMB, TROPONINI, MYOGLOBIN in the last 168 hours.  Invalid input(s): CK ------------------------------------------------------------------------------------------------------------------    Component Value Date/Time   BNP 2,254.1 (H) 01/14/2020 1927    Inpatient Medications  Scheduled Meds: . Chlorhexidine Gluconate Cloth  6 each Topical Q0600  . diltiazem  120 mg Oral  Daily  . gabapentin  100 mg Oral BID  . heparin  5,000 Units Subcutaneous Q8H  . insulin aspart  0-6 Units Subcutaneous Q4H  . ipratropium-albuterol  3 mL Nebulization Once  . methylPREDNISolone (SOLU-MEDROL) injection  60 mg Intravenous Q8H  . sevelamer carbonate  800 mg Oral TID WC   Continuous Infusions: . diltiazem (CARDIZEM) infusion 5 mg/hr (01/27/20 1111)   PRN Meds:.albuterol, guaiFENesin-dextromethorphan, HYDROmorphone (DILAUDID) injection, ipratropium-albuterol, LORazepam  Micro Results Recent Results (from the past 240 hour(s))  SARS Coronavirus 2 by RT PCR (hospital order, performed in [place] hospital lab) Nasopharyngeal Nasopharyngeal Swab     Status: None   Collection Time: 01/19/20  9:57 AM   Specimen: Nasopharyngeal Swab  Result Value Ref Range Status   SARS Coronavirus 2 NEGATIVE NEGATIVE Final    Comment: (NOTE) SARS-CoV-2 target nucleic acids are NOT DETECTED.  The SARS-CoV-2 RNA is generally detectable in upper and lower respiratory specimens during the acute phase of infection. The lowest concentration of SARS-CoV-2 viral copies this assay can detect is 250 copies  / mL. A negative result does not preclude SARS-CoV-2 infection and should not be used as the sole basis for treatment or other patient management decisions.  A negative result may occur with improper specimen collection / handling, submission of specimen other than nasopharyngeal swab, presence of viral mutation(s) within the areas targeted by this assay, and inadequate number of viral copies (<250 copies / mL). A negative result must be combined with clinical observations, patient history, and epidemiological information.  Fact Sheet for Patients:   StrictlyIdeas.no  Fact Sheet for Healthcare Providers: BankingDealers.co.za  This test is not yet approved or  cleared by the [place] FDA and has been authorized for detection and/or diagnosis of SARS-CoV-2 by FDA under an Emergency Use Authorization (EUA).  This EUA will remain in effect (meaning this test can be used) for the duration of the COVID-19 declaration under Section 564(b)(1) of the Act, 21 U.S.C. section 360bbb-3(b)(1), unless the authorization is terminated or revoked sooner.  Performed at [facility], [place] [address] [place], [place] [address]   SARS Coronavirus 2 by RT PCR (hospital order, performed in [facility] hospital lab) Nasopharyngeal Nasopharyngeal Swab     Status: None   Collection Time: 01/26/20 12:47 AM   Specimen: Nasopharyngeal Swab  Result Value Ref Range Status   SARS Coronavirus 2 NEGATIVE NEGATIVE Final    Comment: (NOTE) SARS-CoV-2 target nucleic acids are NOT DETECTED.  The SARS-CoV-2 RNA is generally detectable in upper and lower respiratory specimens during the acute phase of infection. The lowest concentration of SARS-CoV-2 viral copies this assay can detect is 250 copies / mL. A negative result does not preclude SARS-CoV-2 infection and should not be used as the sole basis for treatment or other patient management decisions.  A  negative result may occur with improper specimen collection / handling, submission of specimen other than nasopharyngeal swab, presence of viral mutation(s) within the areas targeted by this assay, and inadequate number of viral copies (<250 copies / mL). A negative result must be combined with clinical observations, patient history, and epidemiological information.  Fact Sheet for Patients:   StrictlyIdeas.no  Fact Sheet for Healthcare Providers: BankingDealers.co.za  This test is not yet approved or  cleared by the [place] FDA and has been authorized for detection and/or diagnosis of SARS-CoV-2 by FDA under an Emergency Use Authorization (EUA).  This EUA will remain in effect (meaning this test can be  used) for the duration of the COVID-19 declaration under Section 564(b)(1) of the Act, 21 U.S.C. section 360bbb-3(b)(1), unless the authorization is terminated or revoked sooner.  Performed at [facility], [place] [address], [place], [place] [address]   MRSA PCR Screening     Status: None   Collection Time: 01/27/20  6:37 AM   Specimen: Nasopharyngeal  Result Value Ref Range Status   MRSA by PCR NEGATIVE NEGATIVE Final    Comment:        The GeneXpert MRSA Assay (FDA approved for NASAL specimens only), is one component of a comprehensive MRSA colonization surveillance program. It is not intended to diagnose MRSA infection nor to guide or monitor treatment for MRSA infections. Performed at [facility], [place] [address], [place], [place] [address]     Radiology Reports DG Chest 2 View  Result Date: 01/20/2020 CLINICAL DATA:  Burning sensation in chest. EXAM: CHEST - 2 VIEW COMPARISON:  January 19, 2020 FINDINGS: Stable cardiomegaly. Hila and mediastinum are normal. Mild haziness in the periphery of the lungs bilaterally. Mild atelectasis in the left base. No other acute abnormalities. IMPRESSION: Mild haziness in the  peripheries of the lungs bilaterally may represent overlapping soft tissues or subtle developing infiltrates. Recommend clinical correlation and close attention on follow-up. Electronically Signed   By: Dorise Bullion III M.D   On: 01/20/2020 16:23   DG Chest 2 View  Result Date: 01/19/2020 CLINICAL DATA:  Shortness of breath, productive cough. Dialysis yesterday. EXAM: CHEST - 2 VIEW COMPARISON:  Chest x-rays dated 01/14/2020 and 01/13/2020. Chest CT angiogram dated 01/14/2020. FINDINGS: Stable cardiomegaly. Lungs are clear. No pleural effusion or pneumothorax is seen. IMPRESSION: 1. Lungs are clear. No evidence of pneumonia or pulmonary edema on today's exam. 2. Stable cardiomegaly. Electronically Signed   By: Franki Cabot M.D.   On: 01/19/2020 08:22   CT Angio Chest PE W and/or Wo Contrast  Result Date: 01/14/2020 CLINICAL DATA:  Shortness of breath. Soft dialysis prematurely secondary to itching. Recent hospital discharge. EXAM: CT ANGIOGRAPHY CHEST WITH CONTRAST TECHNIQUE: Multidetector CT imaging of the chest was performed using the standard protocol during bolus administration of intravenous contrast. Multiplanar CT image reconstructions and MIPs were obtained to evaluate the vascular anatomy. CONTRAST:  35mL OMNIPAQUE IOHEXOL 350 MG/ML SOLN COMPARISON:  CT chest 12/23/2019 FINDINGS: Cardiovascular: Satisfactory opacification the pulmonary arteries to the segmental level. No pulmonary artery filling defects are identified. Central pulmonary arteries are normal caliber. Suboptimal opacification of the aorta for luminal evaluation. Atherosclerotic plaque within the normal caliber aorta. Normal 3 vessel branching of the aortic arch. Extensive atheromatous plaque throughout the proximal great vessels as included. Mild cardiomegaly with predominantly biatrial enlargement. Dense calcifications noted upon the coronary arteries as well as upon the mitral annulus and aortic leaflets. Small pericardial  effusion. Distension of the azygos. Reflux of contrast into the IVC and hepatic veins. No other major venous abnormalities on this arterial phase imaging. Mediastinum/Nodes: Edematous changes the mediastinum. Normal thyroid gland and thoracic inlet. No acute abnormality of the trachea or esophagus. No worrisome mediastinal, hilar or axillary adenopathy. Lungs/Pleura: Small bilateral pleural effusions with adjacent areas of passive atelectasis. Septal and fissural thickening is present with vascular redistribution. Some dependent areas of ground-glass opacity favoring a combination of passive and dependent atelectasis. No worrisome consolidative opacity. No pneumothorax. Stable biapical pleuroparenchymal scarring. No worrisome nodules or masses. Upper Abdomen: Upper abdominal ascites. No other acute upper abdominal abnormality is visible. Musculoskeletal: Diffuse body  wall edema. Osseous manifestations of renal osteodystrophy. No worrisome bone or chest wall lesions. Review of the MIP images confirms the above findings. IMPRESSION: 1. No evidence of acute pulmonary artery filling defects to suggest pulmonary embolism. 2. Cardiomegaly with predominantly biatrial enlargement. Small pericardial effusion. 3. Reflux of contrast into the IVC and hepatic veins, can be seen with right heart failure/fluid overload. 4. Small bilateral pleural effusions with adjacent areas of passive atelectasis. 5. Fissural and septal thickening with vascular redistribution likely to reflect some interstitial edema. 6. Diffuse body wall edema, upper abdominal ascites, and body wall edema, consistent with anasarca. 7. Osseous manifestations of renal osteodystrophy. 8. Aortic Atherosclerosis (ICD10-I70.0). Electronically Signed   By: Lovena Le M.D.   On: 01/14/2020 21:00   DG Chest Port 1 View  Result Date: 01/25/2020 CLINICAL DATA:  Shortness of breath, end-stage renal disease, hepatitis, cirrhosis EXAM: PORTABLE CHEST 1 VIEW COMPARISON:   01/20/2020 FINDINGS: Single frontal view of the chest demonstrates persistent enlargement of the cardiac silhouette. Diffuse increased interstitial prominence since prior study likely reflects interstitial edema. No airspace disease, effusion, or pneumothorax. No acute bony abnormalities. IMPRESSION: 1. Increased interstitial prominence compatible with interstitial edema. Electronically Signed   By: Randa Ngo M.D.   On: 01/25/2020 22:52   DG Chest Portable 1 View  Result Date: 01/14/2020 CLINICAL DATA:  Shortness of breath. EXAM: PORTABLE CHEST 1 VIEW COMPARISON:  January 13, 2020 FINDINGS: Mild, diffuse, chronic appearing increased lung markings are seen. There is decreased vascular congestion since the prior study. Mild, hazy areas of atelectasis and/or early infiltrate are seen along the periphery of the mid left lung. There is no evidence of a pleural effusion or pneumothorax. The cardiac silhouette is markedly enlarged. There is mild calcification of the aortic arch. The visualized skeletal structures are unremarkable. IMPRESSION: 1. Mild, hazy areas of atelectasis and/or early infiltrate along the periphery of the mid left lung. 2. Decreased pulmonary vascular congestion when compared to the prior exam. Electronically Signed   By: Virgina Norfolk M.D.   On: 01/14/2020 18:29   DG Chest Portable 1 View  Result Date: 01/13/2020 CLINICAL DATA:  59 year old male with shortness of breath. EXAM: PORTABLE CHEST 1 VIEW COMPARISON:  Chest radiograph dated 12/21/2019 and CT dated 12/23/2019 FINDINGS: There is moderate enlargement of the cardiopericardial silhouette similar to the radiograph of 12/21/2019. There is central vascular prominence consistent with congestion. Trace bilateral pleural effusions noted. There are bibasilar atelectatic changes. No lobar consolidation or pneumothorax. Atherosclerotic calcification of the aorta and branches of the aortic arch. No acute osseous pathology. IMPRESSION: 1.  Cardiomegaly with vascular congestion and possible mild edema. Overall slight interval worsening of the congestion since the radiograph of 12/21/2019. 2. Advanced atherosclerotic calcification of the aorta and branches of the aortic arch. Electronically Signed   By: Anner Crete M.D.   On: 01/13/2020 16:32   [place] ASCITES (ABDOMEN LIMITED)  Result Date: 01/14/2020 CLINICAL DATA:  Assess ascites. Multiple previous paracenteses due to cirrhosis. EXAM: LIMITED ABDOMEN ULTRASOUND FOR ASCITES TECHNIQUE: Limited ultrasound survey for ascites was performed in all four abdominal quadrants. COMPARISON:  12/22/2019 FINDINGS: Examination demonstrates mild-to-moderate amount of ascites left greater than right. Ascites over the right abdomen is improved compared to the images from the previous exam. IMPRESSION: Mild-to-moderate ascites left greater than right. Electronically Signed   By: Marin Olp M.D.   On: 01/14/2020 21:52   IR Paracentesis  Result Date: 01/24/2020 INDICATION: End-stage renal disease, cirrhosis, hepatitis B and C, recurrent  ascites. Request for therapeutic paracentesis EXAM: ULTRASOUND GUIDED PARACENTESIS MEDICATIONS: 1% lidocaine 15 mL COMPLICATIONS: None immediate. PROCEDURE: Informed written consent was obtained from the patient after a discussion of the risks, benefits and alternatives to treatment. A timeout was performed prior to the initiation of the procedure. Initial ultrasound scanning demonstrates a moderate amount of ascites within the left lower abdominal quadrant. The left lower abdomen was prepped and draped in the usual sterile fashion. 1% lidocaine was used for local anesthesia. Following this, a 6 Fr Safe-T-Centesis catheter was introduced. An ultrasound image was saved for documentation purposes. The paracentesis was performed. The catheter was removed and a dressing was applied. The patient tolerated the procedure well without immediate post procedural complication. FINDINGS: A  total of approximately 1.4 L of amber fluid was removed. IMPRESSION: Successful ultrasound-guided paracentesis yielding 1.4 liters of peritoneal fluid. Read by: Gareth Eagle, PA-C Electronically Signed   By: Aletta Edouard M.D.   On: 01/24/2020 14:49   IR Paracentesis  Result Date: 01/17/2020 INDICATION: Patient with history end-stage renal disease, cirrhosis, hepatitis B/C, recurrent ascites ;request made for therapeutic paracentesis EXAM: ULTRASOUND GUIDED THERAPEUTIC  PARACENTESIS MEDICATIONS: None COMPLICATIONS: None immediate. PROCEDURE: Informed written consent was obtained from the patient after a discussion of the risks, benefits and alternatives to treatment. A timeout was performed prior to the initiation of the procedure. Initial ultrasound scanning demonstrates a moderate amount of ascites within the left lower abdominal quadrant. The left lower abdomen was prepped and draped in the usual sterile fashion. 1% lidocaine was used for local anesthesia. Following this, a 6 Fr Safe-T-Centesis catheter was introduced. An ultrasound image was saved for documentation purposes. The paracentesis was performed. The catheter was removed and a dressing was applied. The patient tolerated the procedure well without immediate post procedural complication. FINDINGS: A total of approximately 3.2 liters of hazy,amber fluid was removed. IMPRESSION: Successful ultrasound-guided therapeutic paracentesis yielding 3.2 liters of peritoneal fluid. Read by: Lindaann Pascal Electronically Signed   By: Markus Daft M.D.   On: 01/17/2020 13:42   IR Paracentesis  Result Date: 01/10/2020 INDICATION: Patient with a history of cirrhosis and recurrent ascites. Interventional radiology asked to perform a therapeutic paracentesis. EXAM: ULTRASOUND GUIDED PARACENTESIS MEDICATIONS: 1% lidocaine 20 mL COMPLICATIONS: None immediate. PROCEDURE: Informed written consent was obtained from the patient after a discussion of the risks, benefits  and alternatives to treatment. A timeout was performed prior to the initiation of the procedure. Initial ultrasound scanning demonstrates a large amount of ascites within the right lower abdominal quadrant. The right lower abdomen was prepped and draped in the usual sterile fashion. 1% lidocaine was used for local anesthesia. Following this, a 19 gauge, 7-cm, Yueh catheter was introduced. A strong fluid return was obtained but when attached to suction the flow stopped. Multiple attempts at manipulating the catheter were made without success. Ultrasound imaging continued to demonstrate a large amount of ascites in the right lower quadrant. The catheter was removed and a second 19 gauge, 7-cm Yueh was inserted with the same results. The catheter was removed and a dressing was applied. Ultrasound scanning demonstrated a large amount of ascites within the left lower abdominal quadrant. The left lower abdomen was prepped and draped in the usual sterile fashion. 1% lidocaine was used for local anesthesia. A 19 gauge, 7-cm Teressa Lower was inserted with a strong fluid return but when attached to suction the flow stopped. Multiple attempts at manipulating the catheter were made without success. The catheter was removed. A 6  [place] Safe-T-Centesis was then inserted with expected results. An ultrasound image was saved for documentation purposes. The paracentesis was performed. The catheter was removed and a dressing was applied. The patient tolerated the procedure well without immediate post procedural complication. FINDINGS: A total of approximately 3 L of light pink fluid was removed. IMPRESSION: Successful ultrasound-guided paracentesis yielding 3 liters of peritoneal fluid. Read by: Soyla Dryer, NP Electronically Signed   By: Markus Daft M.D.   On: 01/10/2020 17:15   IR Paracentesis  Result Date: 01/03/2020 INDICATION: Cirrhosis with recurrent ascites. Request for diagnostic and therapeutic paracentesis. EXAM: ULTRASOUND  GUIDED PARACENTESIS MEDICATIONS: 1% lidocaine 15 mL COMPLICATIONS: None immediate. PROCEDURE: Informed written consent was obtained from the patient after a discussion of the risks, benefits and alternatives to treatment. A timeout was performed prior to the initiation of the procedure. Initial ultrasound scanning demonstrates a moderate amount of ascites within the left lower abdominal quadrant. The left lower abdomen was prepped and draped in the usual sterile fashion. 1% lidocaine was used for local anesthesia. Following this, a 19 gauge, 7-cm, Yueh catheter was introduced. An ultrasound image was saved for documentation purposes. The paracentesis was performed. The catheter was removed and a dressing was applied. The patient tolerated the procedure well without immediate post procedural complication. FINDINGS: A total of approximately 2.9 L of red fluid was removed. Samples were sent to the laboratory as requested by the clinical team. IMPRESSION: Successful ultrasound-guided paracentesis yielding 2.9 liters of peritoneal fluid. Read by: Gareth Eagle, PA-C Electronically Signed   By: Markus Daft M.D.   On: 01/03/2020 14:33     Phillips Climes M.D on 01/27/2020 at 3:58 PM    [facility] -  Office  [phone]

## 2020-01-27 NOTE — Progress Notes (Addendum)
CRITICAL VALUE ALERT  Critical Value:  Potassium  6.8  Date & Time Notied:  7/18 @ 6:44  Provider Notified:  Kennon Holter, NP paged 7/18 @ 6:45  Orders Received/Actions taken:  Awaiting further orders. Will continue to monitor.    Addendum: Orders received 7/18 @ 6:51  STAT 12 lead EKG Insulin 5 u Dextrose 50% 50 mL Potassium Lab @ 10 AM

## 2020-01-27 NOTE — Evaluation (Signed)
Physical Therapy Evaluation Patient Details Name: [name] MRN: [id number] DOB: Dec 24, 1960 Today's Date: 01/27/2020   History of Present Illness  59 y.o. male with PMH of ESRD, CAD, PAF, cirrhosis, hep B & C, EtOH abuse, COPD, and tobacco abuse presented to ED with SOB and tachycardia and admitted for COPD exacerbation and a flutter with RVR.  Clinical Impression  Pt presents to PT at or near his functional baseline. Pt ambulates on room air with sats stable in low 90s. Pt does fatigue but recovers with standing rest break, reporting that he has walked much further this session than he does at baseline. Pt is able to mobilize independently at this time, dresses himself and performs toileting and hygiene tasks without assistance. Pt requires no further acute PT services at this time and is encouraged to mobilize out of the room multiple times a day to maintain his current functional status. No PT or DME needs, acute PT signing off.    Follow Up Recommendations No PT follow up    Equipment Recommendations  None recommended by PT    Recommendations for Other Services       Precautions / Restrictions Precautions Precautions: None Restrictions Weight Bearing Restrictions: No      Mobility  Bed Mobility Overal bed mobility: Independent                Transfers Overall transfer level: Independent Equipment used: None                Ambulation/Gait Ambulation/Gait assistance: Modified independent (Device/Increase time) Gait Distance (Feet): 150 Feet Assistive device: None Gait Pattern/deviations: Step-through pattern;WFL(Within Functional Limits) Gait velocity: functional Gait velocity interpretation: 1.31 - 2.62 ft/sec, indicative of limited community ambulator General Gait Details: steady step through gait, pt takes on standing rest break due to fatigue  Stairs            Wheelchair Mobility    Modified Rankin (Stroke Patients Only)        Balance Overall balance assessment: Mild deficits observed, not formally tested                                           Pertinent Vitals/Pain Pain Assessment: No/denies pain    Home Living Family/patient expects to be discharged to:: Private residence Living Arrangements: Alone Available Help at Discharge: Personal care attendant (daily per pt) Type of Home: Apartment Home Access: Stairs to enter Entrance Stairs-Rails: None Entrance Stairs-Number of Steps: 2 Home Layout: One level Home Equipment: Walker - 2 wheels;Cane - single point      Prior Function Level of Independence: Independent         Comments: pt reports previous use of cane but not recently     Hand Dominance   Dominant Hand: Right    Extremity/Trunk Assessment   Upper Extremity Assessment Upper Extremity Assessment: Overall WFL for tasks assessed    Lower Extremity Assessment Lower Extremity Assessment: Overall WFL for tasks assessed    Cervical / Trunk Assessment Cervical / Trunk Assessment: Normal  Communication   Communication: No difficulties  Cognition Arousal/Alertness: Awake/alert Behavior During Therapy: WFL for tasks assessed/performed Overall Cognitive Status: No family/caregiver present to determine baseline cognitive functioning  General Comments: PT believes pt is at his cognitive baseline, although demonstrates some reduced awareness of deficits      General Comments General comments (skin integrity, edema, etc.): pt able to dres slower body modI and toilet modI    Exercises     Assessment/Plan    PT Assessment Patent does not need any further PT services  PT Problem List         PT Treatment Interventions      PT Goals (Current goals can be found in the Care Plan section)       Frequency     Barriers to discharge        Co-evaluation               AM-PAC PT "6 Clicks" Mobility   Outcome Measure Help needed turning from your back to your side while in a flat bed without using bedrails?: None Help needed moving from lying on your back to sitting on the side of a flat bed without using bedrails?: None Help needed moving to and from a bed to a chair (including a wheelchair)?: None Help needed standing up from a chair using your arms (e.g., wheelchair or bedside chair)?: None Help needed to walk in hospital room?: None Help needed climbing 3-5 steps with a railing? : None 6 Click Score: 24    End of Session   Activity Tolerance: Patient tolerated treatment well Patient left: in bed;with call bell/phone within reach Nurse Communication: Mobility status      Time: 3383-2919 PT Time Calculation (min) (ACUTE ONLY): 24 min   Charges:   PT Evaluation $PT Eval Moderate Complexity: 1 Mod PT Treatments $Gait Training: 8-22 mins        Zenaida Niece, PT, DPT Acute Rehabilitation Pager: [phone]   Zenaida Niece 01/27/2020, 11:02 AM

## 2020-01-27 NOTE — Progress Notes (Signed)
Treatment ended although complete. Patient requested to end treatment due to wanting to use bathroom. Declined the bedpan.

## 2020-01-27 NOTE — Progress Notes (Signed)
Subjective:  Feels well  Heart rate much better controlled with diltiazem drip at 5.  Dialyzed today given K 6.8.  Objective:  Vital Signs in the last 24 hours: Temp:  [97.6 F (36.4 C)-98.5 F (36.9 C)] 97.6 F (36.4 C) (07/18 1432) Pulse Rate:  [63-109] 90 (07/18 1432) Resp:  [15-21] 21 (07/18 1432) BP: (100-134)/(57-89) 111/57 (07/18 1432) SpO2:  [92 %-99 %] 95 % (07/18 1432) Weight:  [66.9 kg-68.9 kg] 66.9 kg (07/18 1432)  Intake/Output from previous day: 07/17 0701 - 07/18 0700 In: 1755.4 [P.O.:860; I.V.:395.4; IV Piggyback:500] Out: -   Physical Exam Vitals and nursing note reviewed.  Constitutional:      General: He is not in acute distress.    Appearance: He is well-developed. He is ill-appearing (Chrinically ill appearing).  HENT:     Head: Normocephalic and atraumatic.  Eyes:     Conjunctiva/sclera: Conjunctivae normal.     Pupils: Pupils are equal, round, and reactive to light.  Neck:     Vascular: No JVD.  Cardiovascular:     Rate and Rhythm: Normal rate and regular rhythm.     Pulses: Normal pulses and intact distal pulses.     Heart sounds: Murmur heard.  Low-pitched rumbling crescendo presystolic murmur is present with a grade of 2/4 at the apex.   Pulmonary:     Effort: Pulmonary effort is normal.     Breath sounds: Normal breath sounds. No wheezing or rales.  Abdominal:     General: Bowel sounds are normal. There is distension (Mild ascites).     Palpations: Abdomen is soft.     Tenderness: There is no rebound.  Musculoskeletal:        General: No tenderness. Normal range of motion.     Left lower leg: No edema.  Lymphadenopathy:     Cervical: No cervical adenopathy.  Skin:    General: Skin is warm and dry.  Neurological:     Mental Status: He is alert and oriented to person, place, and time.     Cranial Nerves: No cranial nerve deficit.      Lab Results: BMP Recent Labs    01/20/20 1543 01/20/20 1543 01/25/20 2218 01/26/20 0221  01/27/20 0557 01/27/20 1208 01/27/20 1429  NA 132*  --  133*  --  128*  --   --   K 4.5   < > 5.9*   < > 6.8* 6.4* 4.4  CL 89*  --  89*  --  85*  --   --   CO2 27  --  29  --  26  --   --   GLUCOSE 171*  --  102*  --  167*  --   --   BUN 36*  --  28*  --  54*  --   --   CREATININE 4.36*  --  3.94*  --  5.70*  --   --   CALCIUM 9.5  --  9.5  --  10.1  --   --   GFRNONAA 14*  --  16*  --  10*  --   --   GFRAA 16*  --  18*  --  12*  --   --    < > = values in this interval not displayed.    CBC Recent Labs  Lab 01/25/20 2218 01/25/20 2218 01/27/20 0557  WBC 12.0*   < > 18.5*  RBC 4.45   < > 4.41  HGB 12.2*   < >  12.0*  HCT 35.6*   < > 34.8*  PLT 232   < > 229  MCV 80.0   < > 78.9*  MCH 27.4   < > 27.2  MCHC 34.3   < > 34.5  RDW 15.8*   < > 15.5  LYMPHSABS 1.3  --   --   MONOABS 1.5*  --   --   EOSABS 0.2  --   --   BASOSABS 0.1  --   --    < > = values in this interval not displayed.    HEMOGLOBIN A1C Lab Results  Component Value Date   HGBA1C 4.8 01/26/2020   MPG 91.06 01/26/2020    Cardiac Panel (last 3 results) No results for input(s): CKTOTAL, CKMB, TROPONINI, RELINDX in the last 8760 hours.  BNP (last 3 results) Recent Labs    10/06/19 1159 11/12/19 2341 01/14/20 1927  BNP 2,690.1* 3,672.9* 2,254.1*    TSH No results for input(s): TSH in the last 8760 hours.  Lipid Panel     Component Value Date/Time   CHOL 103 08/17/2016 0134   TRIG 183 (H) 10/04/2019 1400   HDL 34 (L) 08/17/2016 0134   CHOLHDL 3.0 08/17/2016 0134   VLDL 19 08/17/2016 0134   LDLCALC 50 08/17/2016 0134     Hepatic Function Panel Recent Labs    02/19/19 1007 03/08/19 1850 01/14/20 1926 01/15/20 0838 01/19/20 0838 01/19/20 0838 01/20/20 0044 01/25/20 2218 01/27/20 0557  PROT 6.8   < > 7.0   < > 6.8  --   --  6.5 7.5  ALBUMIN 3.8   < > 2.6*   < > 2.5*   < > 2.7* 2.6* 3.0*  AST 28   < > 46*   < > 108*  --   --  73* 58*  ALT 15   < > 37   < > 81*  --   --  64* 63*   ALKPHOS 105   < > 337*   < > 421*  --   --  364* 380*  BILITOT 0.8   < > 0.7   < > 1.0  --   --  1.0 1.2  BILIDIR 0.3*  --  0.3*  --   --   --   --   --   --   IBILI 0.5  --  0.4  --   --   --   --   --   --    < > = values in this interval not displayed.    CARDIAC STUDIES:  EKG 01/27/2020: Atrial flutter 80 bpm LBBB  EKG 01/26/2020: Atrial flutter with RVR 150 bpm Underlying LBBB  Echocardiogram 10/04/2019: 1. Low normal LV systolic function; severe LVH (myocardium with specked appearance; consider amyloid); mild to moderate AS (mean gradient 11 mmHg  and AVA 1.3 cm2); mild AI; thickened MV with mild to moderate MS (mean gradient 6 mmHg and MVA 1.2-2 cm2;  pt in atrial fibrillation at time of study); severe biatrial enlargement; mild RVE; severe RV dysfunction; severe TR with severe pulmonary hypertension.  2. Left ventricular ejection fraction, by estimation, is 50 to 55%. The left ventricle has low normal function. The left ventricle has no regional  wall motion abnormalities. There is severe left ventricular hypertrophy.  Left ventricular diastolic function could not be evaluated.  3. Right ventricular systolic function is severely reduced. The right ventricular size is mildly enlarged. There is severely elevated pulmonary artery systolic pressure.  4. Left atrial size was severely dilated.  5. Right atrial size was severely dilated.  6. Large pleural effusion in the left lateral region.  7. The mitral valve is normal in structure. Mild mitral valve regurgitation. Mild to moderate mitral stenosis.  8. Tricuspid valve regurgitation is severe.  9. The aortic valve is normal in structure. Aortic valve regurgitation is mild. Mild to moderate aortic valve stenosis.  10. The inferior vena cava is dilated in size with <50% respiratory variability, suggesting right atrial pressure of 15 mmHg.  Assessment & Recommendations:  59y/o African Americanmale withhypertension, CAD (mid RCA  PCI 02/2017), persistent Afib/flutter, severe COPD, ESRD on hemodialysis,chronic hepatitis B and Cwith liver cirrhosis, prior h/o GI bleed, h/o polysubstance abuse,admitted with Afib/flutter.  Aflutter with RVR: Rate much better controlled.  Switch to PO diltiazem 120 mg daily. Drip can be discontinued, preferably after receiving PO diltiazem.  Given that rate is well controlled with diltiazem, do not recommend metoprolol or amiodarone, which he was not taking anyway. Not on anticoagulation due to ESRD, cirrhosis, lack of compliance.   Cardiology signing off. No further cardiac recommendations at this time.  Has f/u scheduled w/Dr. Einar Gip on 7/29.    Nigel Mormon, M.D. [facility], [place] Pager: [phone] Office: [phone]

## 2020-01-28 LAB — COMPREHENSIVE METABOLIC PANEL
ALT: 68 U/L — ABNORMAL HIGH (ref 0–44)
AST: 54 U/L — ABNORMAL HIGH (ref 15–41)
Albumin: 2.8 g/dL — ABNORMAL LOW (ref 3.5–5.0)
Alkaline Phosphatase: 345 U/L — ABNORMAL HIGH (ref 38–126)
Anion gap: 15 (ref 5–15)
BUN: 48 mg/dL — ABNORMAL HIGH (ref 6–20)
CO2: 29 mmol/L (ref 22–32)
Calcium: 9.8 mg/dL (ref 8.9–10.3)
Chloride: 92 mmol/L — ABNORMAL LOW (ref 98–111)
Creatinine, Ser: 5.01 mg/dL — ABNORMAL HIGH (ref 0.61–1.24)
GFR calc Af Amer: 14 mL/min — ABNORMAL LOW (ref >60)
GFR calc non Af Amer: 12 mL/min — ABNORMAL LOW (ref >60)
Glucose, Bld: 143 mg/dL — ABNORMAL HIGH (ref 70–99)
Potassium: 5 mmol/L (ref 3.5–5.1)
Sodium: 136 mmol/L (ref 135–145)
Total Bilirubin: 1.2 mg/dL (ref 0.3–1.2)
Total Protein: 6.9 g/dL (ref 6.5–8.1)

## 2020-01-28 LAB — GLUCOSE, CAPILLARY
Glucose-Capillary: 150 mg/dL — ABNORMAL HIGH (ref 70–99)
Glucose-Capillary: 153 mg/dL — ABNORMAL HIGH (ref 70–99)
Glucose-Capillary: 159 mg/dL — ABNORMAL HIGH (ref 70–99)
Glucose-Capillary: 165 mg/dL — ABNORMAL HIGH (ref 70–99)
Glucose-Capillary: 166 mg/dL — ABNORMAL HIGH (ref 70–99)
Glucose-Capillary: 176 mg/dL — ABNORMAL HIGH (ref 70–99)

## 2020-01-28 LAB — CBC
HCT: 33.9 % — ABNORMAL LOW (ref 39.0–52.0)
Hemoglobin: 11.8 g/dL — ABNORMAL LOW (ref 13.0–17.0)
MCH: 27.9 pg (ref 26.0–34.0)
MCHC: 34.8 g/dL (ref 30.0–36.0)
MCV: 80.1 fL (ref 80.0–100.0)
Platelets: 205 10*3/uL (ref 150–400)
RBC: 4.23 MIL/uL (ref 4.22–5.81)
RDW: 15.6 % — ABNORMAL HIGH (ref 11.5–15.5)
WBC: 17 10*3/uL — ABNORMAL HIGH (ref 4.0–10.5)
nRBC: 0 % (ref 0.0–0.2)

## 2020-01-28 LAB — MAGNESIUM: Magnesium: 2.1 mg/dL (ref 1.7–2.4)

## 2020-01-28 MED ORDER — AMIODARONE HCL 200 MG PO TABS
200.0000 mg | ORAL_TABLET | Freq: Every day | ORAL | Status: DC
  Administered 2020-01-28 – 2020-01-29 (×2): 200 mg via ORAL
  Filled 2020-01-28 (×2): qty 1

## 2020-01-28 MED ORDER — PREDNISONE 20 MG PO TABS
40.0000 mg | ORAL_TABLET | Freq: Every day | ORAL | Status: DC
  Administered 2020-01-29: 40 mg via ORAL
  Filled 2020-01-28 (×3): qty 2

## 2020-01-28 MED ORDER — DIPHENHYDRAMINE HCL 50 MG/ML IJ SOLN
25.0000 mg | Freq: Once | INTRAMUSCULAR | Status: AC
  Administered 2020-01-28: 25 mg via INTRAVENOUS
  Filled 2020-01-28: qty 1

## 2020-01-28 MED ORDER — CHLORHEXIDINE GLUCONATE CLOTH 2 % EX PADS
6.0000 | MEDICATED_PAD | Freq: Every day | CUTANEOUS | Status: DC
  Administered 2020-01-28: 6 via TOPICAL

## 2020-01-28 MED ORDER — METOPROLOL TARTRATE 12.5 MG HALF TABLET
12.5000 mg | ORAL_TABLET | Freq: Two times a day (BID) | ORAL | Status: DC
  Administered 2020-01-28 – 2020-01-29 (×3): 12.5 mg via ORAL
  Filled 2020-01-28 (×3): qty 1

## 2020-01-28 MED ORDER — GABAPENTIN 300 MG PO CAPS
300.0000 mg | ORAL_CAPSULE | Freq: Two times a day (BID) | ORAL | Status: DC
  Administered 2020-01-28 – 2020-01-29 (×2): 300 mg via ORAL
  Filled 2020-01-28 (×2): qty 1

## 2020-01-28 MED ORDER — DIPHENHYDRAMINE HCL 25 MG PO CAPS
25.0000 mg | ORAL_CAPSULE | Freq: Four times a day (QID) | ORAL | Status: DC | PRN
  Administered 2020-01-28 – 2020-01-29 (×2): 25 mg via ORAL
  Filled 2020-01-28 (×2): qty 1

## 2020-01-28 MED ORDER — SEVELAMER CARBONATE 800 MG PO TABS
800.0000 mg | ORAL_TABLET | Freq: Three times a day (TID) | ORAL | Status: DC
  Administered 2020-01-28: 800 mg via ORAL
  Filled 2020-01-28 (×2): qty 1

## 2020-01-28 MED ORDER — SODIUM ZIRCONIUM CYCLOSILICATE 10 G PO PACK
10.0000 g | PACK | Freq: Once | ORAL | Status: AC
  Administered 2020-01-28: 10 g via ORAL
  Filled 2020-01-28: qty 1

## 2020-01-28 NOTE — Progress Notes (Signed)
Subjective:   He is feeling much better today.  Heart rate has gone back up to around 120 to 130 bpm.  Intake/Output from previous day:  I/O last 3 completed shifts: In: 2059.9 [P.O.:1560; I.V.:249.9; IV Piggyback:250] Out: 2140 [Other:2140] No intake/output data recorded.  Blood pressure (!) 188/158, pulse 76, temperature 98.4 F (36.9 C), temperature source Oral, resp. rate 20, weight 68.5 kg, SpO2 94 %.   Vitals with BMI 01/28/2020 01/28/2020 01/28/2020  Height - - -  Weight - 151 lbs -  BMI - 92.92 -  Systolic 446 286 381  Diastolic 771 64 74  Pulse 76 93 96  Some encounter information is confidential and restricted. Go to Review Flowsheets activity to see all data.    Physical Exam Vitals and nursing note reviewed.  Constitutional:      General: He is not in acute distress.    Appearance: He is well-developed. He is ill-appearing (Chrinically ill appearing).  Neck:     Vascular: No JVD.  Cardiovascular:     Rate and Rhythm: Regular rhythm. Tachycardia present.     Pulses: Normal pulses and intact distal pulses.     Heart sounds: Murmur heard.  Low-pitched rumbling crescendo presystolic murmur is present with a grade of 2/4 at the apex.  Gallop present. S3 sounds present.   Pulmonary:     Effort: Pulmonary effort is normal.     Breath sounds: Rhonchi (scattered) present. No wheezing or rales.  Abdominal:     General: Bowel sounds are normal. There is distension (Mild ascites).     Palpations: Abdomen is soft.     Tenderness: There is no rebound.  Musculoskeletal:        General: No tenderness.     Left lower leg: No edema.  Lymphadenopathy:     Cervical: No cervical adenopathy.  Neurological:     Mental Status: He is alert and oriented to person, place, and time.     Cranial Nerves: No cranial nerve deficit.     Lab Results: BMP BNP (last 3 results) Recent Labs    10/06/19 1159 11/12/19 2341 01/14/20 1927  BNP 2,690.1* 3,672.9* 2,254.1*    ProBNP (last  3 results) No results for input(s): PROBNP in the last 8760 hours. BMP Latest Ref Rng & Units 01/28/2020 01/27/2020 01/27/2020  Glucose 70 - 99 mg/dL 143(H) - -  BUN 6 - 20 mg/dL 48(H) - -  Creatinine 0.61 - 1.24 mg/dL 5.01(H) - -  Sodium 135 - 145 mmol/L 136 - -  Potassium 3.5 - 5.1 mmol/L 5.0 4.4 6.4(HH)  Chloride 98 - 111 mmol/L 92(L) - -  CO2 22 - 32 mmol/L 29 - -  Calcium 8.9 - 10.3 mg/dL 9.8 - -   Hepatic Function Latest Ref Rng & Units 01/28/2020 01/27/2020 01/25/2020  Total Protein 6.5 - 8.1 g/dL 6.9 7.5 6.5  Albumin 3.5 - 5.0 g/dL 2.8(L) 3.0(L) 2.6(L)  AST 15 - 41 U/L 54(H) 58(H) 73(H)  ALT 0 - 44 U/L 68(H) 63(H) 64(H)  Alk Phosphatase 38 - 126 U/L 345(H) 380(H) 364(H)  Total Bilirubin 0.3 - 1.2 mg/dL 1.2 1.2 1.0  Bilirubin, Direct 0.0 - 0.2 mg/dL - - -   CBC Latest Ref Rng & Units 01/28/2020 01/27/2020 01/25/2020  WBC 4.0 - 10.5 K/uL 17.0(H) 18.5(H) 12.0(H)  Hemoglobin 13.0 - 17.0 g/dL 11.8(L) 12.0(L) 12.2(L)  Hematocrit 39 - 52 % 33.9(L) 34.8(L) 35.6(L)  Platelets 150 - 400 K/uL 205 229 232   Lipid Panel  Component Value Date/Time   CHOL 103 08/17/2016 0134   TRIG 183 (H) 10/04/2019 1400   HDL 34 (L) 08/17/2016 0134   CHOLHDL 3.0 08/17/2016 0134   VLDL 19 08/17/2016 0134   LDLCALC 50 08/17/2016 0134   Cardiac Panel (last 3 results) No results for input(s): CKTOTAL, CKMB, TROPONINI, RELINDX in the last 72 hours.  HEMOGLOBIN A1C Lab Results  Component Value Date   HGBA1C 4.8 01/26/2020   MPG 91.06 01/26/2020   TSH No results for input(s): TSH in the last 8760 hours. Imaging: Imaging results have been reviewed  Cardiac Studies:  Echocardiogram 10/04/2019: 1. Low normal LV systolic function; severe LVH (myocardium with specked appearance; consider amyloid); mild to moderate AS (mean gradient 11 mmHg  and AVA 1.3 cm2); mild AI; thickened MV with mild to moderate MS (mean gradient 6 mmHg and MVA 1.2-2 cm2;  pt in atrial fibrillation at time of study); severe  biatrial enlargement; mild RVE; severe RV dysfunction; severe TR with severe pulmonary hypertension.  2. Left ventricular ejection fraction, by estimation, is 50 to 55%. The left ventricle has low normal function. The left ventricle has no regional  wall motion abnormalities. There is severe left ventricular hypertrophy.  Left ventricular diastolic function could not be evaluated.  3. Right ventricular systolic function is severely reduced. The right ventricular size is mildly enlarged. There is severely elevated pulmonary artery systolic pressure.  4. Left atrial size was severely dilated.  5. Right atrial size was severely dilated.  6. Large pleural effusion in the left lateral region.  7. The mitral valve is normal in structure. Mild mitral valve regurgitation. Mild to moderate mitral stenosis.  8. Tricuspid valve regurgitation is severe.  9. The aortic valve is normal in structure. Aortic valve regurgitation is mild. Mild to moderate aortic valve stenosis.  10. The inferior vena cava is dilated in size with <50% respiratory variability, suggesting right atrial pressure of 15 mmHg.  Scheduled Meds:  amiodarone  200 mg Oral Daily   Chlorhexidine Gluconate Cloth  6 each Topical Q0600   diltiazem  120 mg Oral Daily   gabapentin  100 mg Oral BID   heparin  5,000 Units Subcutaneous Q8H   insulin aspart  0-6 Units Subcutaneous Q4H   ipratropium-albuterol  3 mL Nebulization Once   methylPREDNISolone (SOLU-MEDROL) injection  60 mg Intravenous Q12H   metoprolol tartrate  12.5 mg Oral BID   sevelamer carbonate  800 mg Oral TID WC   Continuous Infusions:  diltiazem (CARDIZEM) infusion 7.5 mg/hr (01/28/20 0617)   PRN Meds:.albuterol, guaiFENesin-dextromethorphan, HYDROmorphone (DILAUDID) injection, ipratropium-albuterol, LORazepam  Assessment/Plan:  1.  Paroxysmal atrial flutter with rapid ventricular response 2.  End-stage renal disease on hemodialysis  Recommendation: Patient  had discontinued all his home medications, I have restarted him back on amiodarone, although he is known to have prolonged QT, he has really no options as he feels poorly when he goes into atrial flutter.  Not a candidate for ablation.  I will also start him on metoprolol tartrate 12.5 mg p.o. twice daily, he is scheduled for dialysis today and if his blood pressure tolerates will uptitrate metoprolol tartrate.  Continue IV diltiazem for now.  I could also consider addition of digoxin if rate does not improve.   Adrian Prows, MD, [facility] 01/28/2020, 10:14 AM Office: [phone]

## 2020-01-28 NOTE — Progress Notes (Addendum)
[facility] Progress Note   Subjective: Seen in room, in low fowler's position. Denies SOB. Multiple non-medical requests. HD yesterday for hyperkalemia. Only stayed 2 hours 10 minutes of treatment.  AFIB with BBB on monitor. Rate 90-100s  Objective Vitals:   01/28/20 0012 01/28/20 0013 01/28/20 0328 01/28/20 0818  BP:  115/74 103/64 (!) 188/158  Pulse:  96 93 76  Resp:   20 20  Temp: 98.4 F (36.9 C) 98.4 F (36.9 C) 98.4 F (36.9 C) 98.4 F (36.9 C)  TempSrc: Oral Oral Oral Oral  SpO2:  97% 99% 94%  Weight:   68.5 kg    Physical Exam General: chronically ill appearing male in NAD Heart: irreg, irreg. No M/R/G Afib with BBB on monitor Lungs: bilateral breath sounds slightly decreased in bases few inspiratory wheezes RUL otherwise CTAB. Abdomen: distended, ascites present. Active BS Extremities: No LE edema Dialysis Access: R AVG + bruit. Abraded areas on AVG from previous needle sticks in same location. Need to rotate sites.     Additional Objective Labs: Basic Metabolic Panel: Recent Labs  Lab 01/25/20 2218 01/26/20 0221 01/26/20 1146 01/27/20 0557 01/27/20 0557 01/27/20 1208 01/27/20 1429 01/28/20 0504  NA 133*  --   --  128*  --   --   --  136  K 5.9*   < >  --  6.8*   < > 6.4* 4.4 5.0  CL 89*  --   --  85*  --   --   --  92*  CO2 29  --   --  26  --   --   --  29  GLUCOSE 102*  --   --  167*  --   --   --  143*  BUN 28*  --   --  54*  --   --   --  48*  CREATININE 3.94*  --   --  5.70*  --   --   --  5.01*  CALCIUM 9.5  --   --  10.1  --   --   --  9.8  PHOS  --   --  6.8*  --   --   --   --   --    < > = values in this interval not displayed.   Liver Function Tests: Recent Labs  Lab 01/25/20 2218 01/27/20 0557 01/28/20 0504  AST 73* 58* 54*  ALT 64* 63* 68*  ALKPHOS 364* 380* 345*  BILITOT 1.0 1.2 1.2  PROT 6.5 7.5 6.9  ALBUMIN 2.6* 3.0* 2.8*   No results for input(s): LIPASE, AMYLASE in the last 168 hours. CBC: Recent Labs   Lab 01/25/20 2218 01/27/20 0557 01/28/20 0504  WBC 12.0* 18.5* 17.0*  NEUTROABS 8.8*  --   --   HGB 12.2* 12.0* 11.8*  HCT 35.6* 34.8* 33.9*  MCV 80.0 78.9* 80.1  PLT 232 229 205   Blood Culture    Component Value Date/Time   SDES BLOOD LEFT HAND 01/14/2020 0023   SPECREQUEST  01/14/2020 0023    BOTTLES DRAWN AEROBIC ONLY Blood Culture adequate volume   CULT  01/14/2020 0023    NO GROWTH 5 DAYS Performed at [facility], [place] [address] [place], [place] [address]    REPTSTATUS 01/19/2020 FINAL 01/14/2020 0023    Cardiac Enzymes: No results for input(s): CKTOTAL, CKMB, CKMBINDEX, TROPONINI in the last 168 hours. CBG: Recent Labs  Lab 01/27/20 1621 01/27/20 2012 01/28/20 0009  01/28/20 0329 01/28/20 0816  GLUCAP 186* 162* 165* 159* 176*   Iron Studies: No results for input(s): IRON, TIBC, TRANSFERRIN, FERRITIN in the last 72 hours. @lablastinr3 @ Studies/Results: No results found. Medications: . diltiazem (CARDIZEM) infusion 7.5 mg/hr (01/28/20 0617)   . diltiazem  120 mg Oral Daily  . gabapentin  100 mg Oral BID  . heparin  5,000 Units Subcutaneous Q8H  . insulin aspart  0-6 Units Subcutaneous Q4H  . ipratropium-albuterol  3 mL Nebulization Once  . methylPREDNISolone (SOLU-MEDROL) injection  60 mg Intravenous Q12H  . sevelamer carbonate  800 mg Oral TID WC   HD orders: [place] MWF 4 hrs 180NRe 450/800 61 kg 2.0K/2.0 Ca R AVG -No heparin  -Calcitriol 0.75 mcg PO TIW  Assessment/Plan: 1.  Acute on chronic respiratory failure-COPD exacerbation/Volume overload. Improved with HD. Per primary.  2. PAF-HR 150s on admission. Started on Diltiazem gtt per cards. Not on anticoagulation D/T noncompliance.  3. Hyperkalemia-K+ still 5.0 this AM. HD today on 2.0 K bath.  4. ESRD-MWF. Truncates treatments. Will run short treatment today to get back on MWF if he does not refuse.  5. Secondary hyperparathyroidism - Continue binders, VDRA 6. Anemia-HGB 11.8. No ESA  needed. Follow HGB.  7. HTN/volume - Volume improved post HD 07/18. Net UF 2.1 L Still above OP EDW. Needs standing wt. UF today as tolerated 8. Nutrition - Albumin low in setting of cirrhosis. Renal diet. Protein supps.  9. Cirrhosis with recurrent ascites. Last paracentesis 07/15 1.4 liters. Per primary.  10. Noncompliance with HD-truncates treatments, rarely, if ever runs full treatments. Discussed with pt-he always says he will stay full treatment but this doesn't happen.   Terius Jacuinde H. Jacobe Study NP-C 01/28/2020, 8:36 AM  [employer] [phone]

## 2020-01-28 NOTE — Progress Notes (Signed)
Called to see pt with HR in 170's after using bathroom. On arrival pt HR 120s BP 126/92, and pt states he is feeling better. Primary MD called PTA RRT Cardizem gtt restarted. Please call RRT if assistance is needed.

## 2020-01-28 NOTE — Progress Notes (Signed)
Dina Rich, RN called and notified that the pt's HD tx has been moved to 01/29/20.

## 2020-01-28 NOTE — Progress Notes (Addendum)
PROGRESS NOTE    [name]  LNL:[id number] DOB: 1960/10/28 DOA: 01/25/2020 PCP: [name], FNP   Brief Narrative:  Patient is a 59 year old male with history of ESRD on dialysis, coronary artery  disease, paroxysmal  A. fib, cirrhosis, hepatitis B and C, ethanol abuse, COPD, tobacco abuse who presents to emergency department with complaint of shortness of breath, tachycardia.  He was admitted for the management of COPD exacerbation and atrial flutter with RVR.  He was seen by cardiology and started on amiodarone drip which has been transitioned to Cardizem.  His dyspnea and hypoxia have significantly improved with IV steroids.  Nephrology following for dialysis. Last night he went into RVR and was started on Cardizem drip.  Heart rate is well controlled this morning.  Start on beta-blocker and oral amiodarone.  Assessment & Plan:   Principal Problem:   Acute respiratory failure with hypoxia (HCC) Active Problems:   Alcohol abuse   Nicotine dependence, cigarettes, uncomplicated   Chronic hepatitis B (HCC)   Chronic hepatitis C without hepatic coma (HCC)   COPD exacerbation (HCC)   Essential hypertension   Acute on chronic respiratory failure with hypoxia (HCC)   Arrhythmia   COPD (chronic obstructive pulmonary disease) (HCC)   Hyperkalemia   Atypical atrial flutter (HCC)   ESRD on dialysis ([place])   Cirrhosis of liver with ascites (HCC)   QT prolongation   Tobacco use   Acute on chronic hypoxic respiratory failure: Secondary to COPD exacerbation.  Presented with shortness of breath, cough, increased sputum production and wheezing.  Chest x-ray showed interstitial prominence compatible with interstitial edema.  Patient improved with IV steroids.  Not on oxygen at home.  Currently maintaining his saturation on room air.  His lungs are clear on auscultation.  Will change steroids to oral.  Proximal A. Fib/flutter with RVR: Hospital course remarkable for episodes of  RVR.  Not on anticoagulant due to cirrhosis of liver and issues with noncompliance/alcohol abuse.  He was started on amiodarone drip which was switched to Cardizem oral but last night he again went into A. fib with RVR and had to be started on IV Cardizem.  Cardiology  following.  His heart rate has been stable now.  On metoprolol and oral amiodarone.  Cardiology considering digoxin if he goes back to RVR  Liver cirrhosis with ascites: History of cirrhosis, chronic alcohol use, hepatitis B and C.  Paracentesis was done on 7/15 for ascites.  Repeat paracentesis as needed.  Abdomen pain: Has history of chronic abdominal pain.  On chronic pain medications.  Abdomen is nonacute, nontender.  Hypertension: Continue to monitor blood pressure.  Continue current medications.  ESRD on hemodialysis: Nephrology following for dialysis.  Hospital course remarkable for hyperkalemia needing emergent dialysis.  GERD: Was on Protonix but has been held due to prolonged QT.  Tobacco abuse: Declines nicotine patch.  Counseled for cessation.  Leukocytosis: Most likely secondary to steroids.  No indication for antibiotic therapy           DVT prophylaxis: heparin [place] Code Status: DNR Family Communication: None present at the bedside Status is: Inpatient  Remains inpatient appropriate because:Hemodynamically unstable   Dispo: The patient is from: Home              Anticipated d/c is to: Home              Anticipated d/c date is: 1 day  Patient currently is not medically stable to d/c.  Was in A. fib with RVR early this morning.    Consultants: Cardiology  Procedures: None  Antimicrobials:  Anti-infectives (From admission, onward)   Start     Dose/Rate Route Frequency Ordered Stop   01/26/20 0930  doxycycline (VIBRAMYCIN) 100 mg in sodium chloride 0.9 % 250 mL IVPB  Status:  Discontinued        100 mg 125 mL/hr over 120 Minutes Intravenous Every 12 hours 01/26/20 0928 01/27/20 0755       Subjective:  Patient seen and examined at the bedside this morning.  Heart rate is well controlled during my evaluation.  Looks very comfortable and hungry and looking for food.  Lungs are clear to auscultation, on room air  Objective: Vitals:   01/28/20 0012 01/28/20 0013 01/28/20 0328 01/28/20 0818  BP:  115/74 103/64 (!) 188/158  Pulse:  96 93 76  Resp:   20 20  Temp: 98.4 F (36.9 C) 98.4 F (36.9 C) 98.4 F (36.9 C) 98.4 F (36.9 C)  TempSrc: Oral Oral Oral Oral  SpO2:  97% 99% 94%  Weight:   68.5 kg     Intake/Output Summary (Last 24 hours) at 01/28/2020 0824 Last data filed at 01/27/2020 2359 Gross per 24 hour  Intake 908.39 ml  Output 2140 ml  Net -1231.61 ml   Filed Weights   01/27/20 1136 01/27/20 1432 01/28/20 0328  Weight: 68.9 kg 66.9 kg 68.5 kg    Examination:  General exam: Appears calm and comfortable ,Not in distress,average built HEENT:PERRL,Oral mucosa moist, Ear/Nose normal on gross exam Respiratory system: Bilateral equal air entry, normal vesicular breath sounds, no wheezes or crackles  Cardiovascular system: Afib No JVD, murmurs, rubs, gallops or clicks. No pedal edema. Gastrointestinal system: Abdomen is distended, soft and nontender. No organomegaly or masses felt. Normal bowel sounds heard. Central nervous system: Alert and oriented.  Extremities: No edema, no clubbing ,no cyanosis, AV graft on the right arm Skin: No rashes, lesions or ulcers,no icterus ,no pallor    Data Reviewed: I have personally reviewed following labs and imaging studies  CBC: Recent Labs  Lab 01/25/20 2218 01/27/20 0557 01/28/20 0504  WBC 12.0* 18.5* 17.0*  NEUTROABS 8.8*  --   --   HGB 12.2* 12.0* 11.8*  HCT 35.6* 34.8* 33.9*  MCV 80.0 78.9* 80.1  PLT 232 229 009   Basic Metabolic Panel: Recent Labs  Lab 01/25/20 2218 01/25/20 2218 01/26/20 0221 01/26/20 1146 01/27/20 0557 01/27/20 1208 01/27/20 1429 01/28/20 0504  NA 133*  --   --   --   128*  --   --  136  K 5.9*   < > 5.2*  --  6.8* 6.4* 4.4 5.0  CL 89*  --   --   --  85*  --   --  92*  CO2 29  --   --   --  26  --   --  29  GLUCOSE 102*  --   --   --  167*  --   --  143*  BUN 28*  --   --   --  54*  --   --  48*  CREATININE 3.94*  --   --   --  5.70*  --   --  5.01*  CALCIUM 9.5  --   --   --  10.1  --   --  9.8  MG  --   --   --  2.8* 2.7*  --   --  2.1  PHOS  --   --   --  6.8*  --   --   --   --    < > = values in this interval not displayed.   GFR: Estimated Creatinine Clearance: 15.8 mL/min (A) (by C-G formula based on SCr of 5.01 mg/dL (H)). Liver Function Tests: Recent Labs  Lab 01/25/20 2218 01/27/20 0557 01/28/20 0504  AST 73* 58* 54*  ALT 64* 63* 68*  ALKPHOS 364* 380* 345*  BILITOT 1.0 1.2 1.2  PROT 6.5 7.5 6.9  ALBUMIN 2.6* 3.0* 2.8*   No results for input(s): LIPASE, AMYLASE in the last 168 hours. No results for input(s): AMMONIA in the last 168 hours. Coagulation Profile: Recent Labs  Lab 01/26/20 1146  INR 1.1   Cardiac Enzymes: No results for input(s): CKTOTAL, CKMB, CKMBINDEX, TROPONINI in the last 168 hours. BNP (last 3 results) No results for input(s): PROBNP in the last 8760 hours. HbA1C: Recent Labs    01/26/20 1146  HGBA1C 4.8   CBG: Recent Labs  Lab 01/27/20 1621 01/27/20 2012 01/28/20 0009 01/28/20 0329 01/28/20 0816  GLUCAP 186* 162* 165* 159* 176*   Lipid Profile: No results for input(s): CHOL, HDL, LDLCALC, TRIG, CHOLHDL, LDLDIRECT in the last 72 hours. Thyroid Function Tests: No results for input(s): TSH, T4TOTAL, FREET4, T3FREE, THYROIDAB in the last 72 hours. Anemia Panel: No results for input(s): VITAMINB12, FOLATE, FERRITIN, TIBC, IRON, RETICCTPCT in the last 72 hours. Sepsis Labs: No results for input(s): PROCALCITON, LATICACIDVEN in the last 168 hours.  Recent Results (from the past 240 hour(s))  SARS Coronavirus 2 by RT PCR (hospital order, performed in [facility] hospital lab) Nasopharyngeal  Nasopharyngeal Swab     Status: None   Collection Time: 01/19/20  9:57 AM   Specimen: Nasopharyngeal Swab  Result Value Ref Range Status   SARS Coronavirus 2 NEGATIVE NEGATIVE Final    Comment: (NOTE) SARS-CoV-2 target nucleic acids are NOT DETECTED.  The SARS-CoV-2 RNA is generally detectable in upper and lower respiratory specimens during the acute phase of infection. The lowest concentration of SARS-CoV-2 viral copies this assay can detect is 250 copies / mL. A negative result does not preclude SARS-CoV-2 infection and should not be used as the sole basis for treatment or other patient management decisions.  A negative result may occur with improper specimen collection / handling, submission of specimen other than nasopharyngeal swab, presence of viral mutation(s) within the areas targeted by this assay, and inadequate number of viral copies (<250 copies / mL). A negative result must be combined with clinical observations, patient history, and epidemiological information.  Fact Sheet for Patients:   StrictlyIdeas.no  Fact Sheet for Healthcare Providers: BankingDealers.co.za  This test is not yet approved or  cleared by the [place] FDA and has been authorized for detection and/or diagnosis of SARS-CoV-2 by FDA under an Emergency Use Authorization (EUA).  This EUA will remain in effect (meaning this test can be used) for the duration of the COVID-19 declaration under Section 564(b)(1) of the Act, 21 U.S.C. section 360bbb-3(b)(1), unless the authorization is terminated or revoked sooner.  Performed at [facility], [place] [address] [place], [place] [address]   SARS Coronavirus 2 by RT PCR (hospital order, performed in [facility] hospital lab) Nasopharyngeal Nasopharyngeal Swab     Status: None   Collection Time: 01/26/20 12:47 AM   Specimen: Nasopharyngeal Swab  Result Value Ref Range Status  SARS Coronavirus 2  NEGATIVE NEGATIVE Final    Comment: (NOTE) SARS-CoV-2 target nucleic acids are NOT DETECTED.  The SARS-CoV-2 RNA is generally detectable in upper and lower respiratory specimens during the acute phase of infection. The lowest concentration of SARS-CoV-2 viral copies this assay can detect is 250 copies / mL. A negative result does not preclude SARS-CoV-2 infection and should not be used as the sole basis for treatment or other patient management decisions.  A negative result may occur with improper specimen collection / handling, submission of specimen other than nasopharyngeal swab, presence of viral mutation(s) within the areas targeted by this assay, and inadequate number of viral copies (<250 copies / mL). A negative result must be combined with clinical observations, patient history, and epidemiological information.  Fact Sheet for Patients:   StrictlyIdeas.no  Fact Sheet for Healthcare Providers: BankingDealers.co.za  This test is not yet approved or  cleared by the [place] FDA and has been authorized for detection and/or diagnosis of SARS-CoV-2 by FDA under an Emergency Use Authorization (EUA).  This EUA will remain in effect (meaning this test can be used) for the duration of the COVID-19 declaration under Section 564(b)(1) of the Act, 21 U.S.C. section 360bbb-3(b)(1), unless the authorization is terminated or revoked sooner.  Performed at [facility], [place] [address], [place], [place] [address]   MRSA PCR Screening     Status: None   Collection Time: 01/27/20  6:37 AM   Specimen: Nasopharyngeal  Result Value Ref Range Status   MRSA by PCR NEGATIVE NEGATIVE Final    Comment:        The GeneXpert MRSA Assay (FDA approved for NASAL specimens only), is one component of a comprehensive MRSA colonization surveillance program. It is not intended to diagnose MRSA infection nor to guide or monitor treatment  for MRSA infections. Performed at [facility], [place] [address] [place], [place] [address]          Radiology Studies: No results found.      Scheduled Meds: . diltiazem  120 mg Oral Daily  . gabapentin  100 mg Oral BID  . heparin  5,000 Units Subcutaneous Q8H  . insulin aspart  0-6 Units Subcutaneous Q4H  . ipratropium-albuterol  3 mL Nebulization Once  . methylPREDNISolone (SOLU-MEDROL) injection  60 mg Intravenous Q12H  . sevelamer carbonate  800 mg Oral TID WC   Continuous Infusions: . diltiazem (CARDIZEM) infusion 7.5 mg/hr (01/28/20 0617)     LOS: 2 days    Time spent: 25 mins.More than 50% of that time was spent in counseling and/or coordination of care.      Shelly Coss, MD [facility] P7/19/2021, 8:24 AM

## 2020-01-29 ENCOUNTER — Other Ambulatory Visit: Payer: Self-pay | Admitting: Cardiology

## 2020-01-29 DIAGNOSIS — I480 Paroxysmal atrial fibrillation: Secondary | ICD-10-CM

## 2020-01-29 DIAGNOSIS — I10 Essential (primary) hypertension: Secondary | ICD-10-CM

## 2020-01-29 LAB — BASIC METABOLIC PANEL
Anion gap: 19 — ABNORMAL HIGH (ref 5–15)
BUN: 70 mg/dL — ABNORMAL HIGH (ref 6–20)
CO2: 27 mmol/L (ref 22–32)
Calcium: 10.3 mg/dL (ref 8.9–10.3)
Chloride: 89 mmol/L — ABNORMAL LOW (ref 98–111)
Creatinine, Ser: 6.25 mg/dL — ABNORMAL HIGH (ref 0.61–1.24)
GFR calc Af Amer: 11 mL/min — ABNORMAL LOW (ref >60)
GFR calc non Af Amer: 9 mL/min — ABNORMAL LOW (ref >60)
Glucose, Bld: 155 mg/dL — ABNORMAL HIGH (ref 70–99)
Potassium: 5.1 mmol/L (ref 3.5–5.1)
Sodium: 135 mmol/L (ref 135–145)

## 2020-01-29 LAB — CBC
HCT: 34.3 % — ABNORMAL LOW (ref 39.0–52.0)
Hemoglobin: 11.9 g/dL — ABNORMAL LOW (ref 13.0–17.0)
MCH: 27.7 pg (ref 26.0–34.0)
MCHC: 34.7 g/dL (ref 30.0–36.0)
MCV: 79.8 fL — ABNORMAL LOW (ref 80.0–100.0)
Platelets: 229 10*3/uL (ref 150–400)
RBC: 4.3 MIL/uL (ref 4.22–5.81)
RDW: 15.5 % (ref 11.5–15.5)
WBC: 17.6 10*3/uL — ABNORMAL HIGH (ref 4.0–10.5)
nRBC: 0.1 % (ref 0.0–0.2)

## 2020-01-29 LAB — GLUCOSE, CAPILLARY
Glucose-Capillary: 122 mg/dL — ABNORMAL HIGH (ref 70–99)
Glucose-Capillary: 133 mg/dL — ABNORMAL HIGH (ref 70–99)
Glucose-Capillary: 148 mg/dL — ABNORMAL HIGH (ref 70–99)

## 2020-01-29 MED ORDER — PREDNISONE 20 MG PO TABS: 40.0000 mg | ORAL_TABLET | Freq: Every day | ORAL | 0 refills | Status: AC

## 2020-01-29 MED ORDER — AMIODARONE HCL 200 MG PO TABS: 200.0000 mg | ORAL_TABLET | Freq: Every day | ORAL | 1 refills | Status: DC

## 2020-01-29 MED ORDER — SEVELAMER CARBONATE 800 MG PO TABS
2400.0000 mg | ORAL_TABLET | Freq: Three times a day (TID) | ORAL | Status: DC
  Administered 2020-01-29: 2400 mg via ORAL
  Filled 2020-01-29: qty 3

## 2020-01-29 MED ORDER — METOPROLOL TARTRATE 25 MG PO TABS: 12.5000 mg | ORAL_TABLET | Freq: Two times a day (BID) | ORAL | 1 refills | Status: DC

## 2020-01-29 MED ORDER — DIPHENHYDRAMINE HCL 25 MG PO CAPS: 25.0000 mg | ORAL_CAPSULE | Freq: Four times a day (QID) | ORAL | 0 refills | Status: DC | PRN

## 2020-01-29 NOTE — Procedures (Signed)
Seen and examined on dialysis at 9:39 am.  97/67.  Tolerating goal.  On 2 liters and team is setting home oxygen if he qualifes - they are at bedside and state he may be discharged today.   Claudia Desanctis, MD 01/29/2020  9:45 AM

## 2020-01-29 NOTE — Discharge Summary (Signed)
Physician Discharge Summary  Joanna Hall ERD:[id number] DOB: Jul 31, 1960 DOA: 01/25/2020  PCP: [name], FNP  Admit date: 01/25/2020 Discharge date: 01/29/2020  Admitted From: Home Disposition:  Home  Discharge Condition:Stable CODE STATUS: DNR Diet recommendation: Heart Healthy  Brief/Interim Summary:   Patient is a 59 year old male with history of ESRD on dialysis, coronary artery  disease, paroxysmal  A. fib, cirrhosis, hepatitis B and C, ethanol abuse, COPD, tobacco abuse who presents to emergency department with complaint of shortness of breath, tachycardia.  He was admitted for the management of COPD exacerbation and atrial flutter with RVR.  He was seen by cardiology and started on amiodarone drip which has been transitioned to Cardizem.  His dyspnea and hypoxia have significantly improved with IV steroids.  Nephrology was following for dialysis. Hospital course remarkable for Afib with  RVR .Started on beta-blocker and oral amiodarone.  Currently his heart rate is well controlled.  Patient is hemodynamically stable for discharge to home today.  Following problems were addressed during his hospitalization:  Acute on chronic hypoxic respiratory failure: Secondary to COPD exacerbation.  Presented with shortness of breath, cough, increased sputum production and wheezing.  Chest x-ray showed interstitial prominence compatible with interstitial edema.  Patient improved with IV steroids.  Not on oxygen at home.  Currently maintaining his saturation on room air.  His lungs are clear on auscultation.  Will change steroids to oral.  Proximal A. Fib/flutter with RVR: Hospital course remarkable for episodes of RVR.  Not on anticoagulant due to cirrhosis of liver and issues with noncompliance/alcohol abuse.  He was started on amiodarone drip which was switched to Cardizem oral but last night he again went into A. fib with RVR and had to be started on IV Cardizem.  Cardiology   following.  His heart rate has been stable now.  On metoprolol and oral amiodarone. He will follow up with cardiology as an outpatient.  Liver cirrhosis with ascites: History of cirrhosis, chronic alcohol use, hepatitis B and C.  Paracentesis was done on 7/15 for ascites.    He follows with radiology for periodic  paracentesis.  Abdomen pain: Has history of chronic abdominal pain.  On chronic pain medications.  Abdomen is nonacute, nontender.  Hypertension: Continue current meds  ESRD on hemodialysis: Nephrology following for dialysis.  Hospital course remarkable for hyperkalemia needing emergent dialysis.  GERD: Was on Protonix but has been held due to prolonged QT.  Tobacco abuse: Declines nicotine patch.  Counseled for cessation.  Leukocytosis: Most likely secondary to steroids.  No indication for antibiotic therapy.  Check CBC in a week.   Discharge Diagnoses:  Principal Problem:   Acute respiratory failure with hypoxia (HCC) Active Problems:   Alcohol abuse   Nicotine dependence, cigarettes, uncomplicated   Chronic hepatitis B (HCC)   Chronic hepatitis C without hepatic coma (HCC)   COPD exacerbation (HCC)   Essential hypertension   Acute on chronic respiratory failure with hypoxia (HCC)   Arrhythmia   COPD (chronic obstructive pulmonary disease) (HCC)   Hyperkalemia   Atypical atrial flutter (HCC)   ESRD on dialysis ([place])   Cirrhosis of liver with ascites (HCC)   QT prolongation   Tobacco use    Discharge Instructions  Discharge Instructions    Diet - low sodium heart healthy   Complete by: As directed    Discharge instructions   Complete by: As directed    1)Please take prescribed medication as instructed 2) Do a CBC test and  an EKG in a week. 3)You will be called by Dr. Einar Gip 's office for the follow-up appointment.   Increase activity slowly   Complete by: As directed    No wound care   Complete by: As directed      Allergies as of 01/29/2020       Reactions   Tramadol Itching, Other (See Comments)   Morphine And Related Other (See Comments)   Stomach pain   Pollen Extract    Other reaction(s): Sneezing (finding)   Acetaminophen Nausea Only   Stomach ache Other reaction(s): Other (See Comments) Stomach ache   Aspirin Other (See Comments), Itching   STOMACH PAIN Other reaction(s): Unknown STOMACH PAIN   Clonidine Derivatives Itching      Medication List    TAKE these medications   amiodarone 200 MG tablet Commonly known as: PACERONE Take 1 tablet (200 mg total) by mouth daily.   diphenhydrAMINE 25 mg capsule Commonly known as: BENADRYL Take 1 capsule (25 mg total) by mouth every 6 (six) hours as needed for itching or allergies.   gabapentin 300 MG capsule Commonly known as: NEURONTIN Take 1 capsule (300 mg total) by mouth 2 (two) times daily.   metoprolol tartrate 25 MG tablet Commonly known as: LOPRESSOR Take 0.5 tablets (12.5 mg total) by mouth 2 (two) times daily.   Oxycodone HCl 10 MG Tabs Take 10 mg by mouth 3 (three) times daily as needed.   predniSONE 20 MG tablet Commonly known as: DELTASONE Take 2 tablets (40 mg total) by mouth daily with breakfast for 4 days. Start taking on: January 30, 2020   sevelamer carbonate 800 MG tablet Commonly known as: RENVELA Take 800 mg by mouth 3 (three) times daily with meals.   tenofovir 300 MG tablet Commonly known as: VIREAD Take 300 mg by mouth once a week.       Follow-up Information    [name], FNP. Schedule an appointment as soon as possible for a visit in 1 week(s).   Specialty: Family Medicine Contact information: [place] [address] [phone]              Allergies  Allergen Reactions  . Tramadol Itching and Other (See Comments)  . Morphine And Related Other (See Comments)    Stomach pain  . Pollen Extract     Other reaction(s): Sneezing (finding)  . Acetaminophen Nausea Only    Stomach ache Other  reaction(s): Other (See Comments) Stomach ache  . Aspirin Other (See Comments) and Itching    STOMACH PAIN Other reaction(s): Unknown STOMACH PAIN  . Clonidine Derivatives Itching    Consultations:  Cardiology,nephrology   Procedures/Studies: DG Chest 2 View  Result Date: 01/20/2020 CLINICAL DATA:  Burning sensation in chest. EXAM: CHEST - 2 VIEW COMPARISON:  January 19, 2020 FINDINGS: Stable cardiomegaly. Hila and mediastinum are normal. Mild haziness in the periphery of the lungs bilaterally. Mild atelectasis in the left base. No other acute abnormalities. IMPRESSION: Mild haziness in the peripheries of the lungs bilaterally may represent overlapping soft tissues or subtle developing infiltrates. Recommend clinical correlation and close attention on follow-up. Electronically Signed   By: Dorise Bullion III M.D   On: 01/20/2020 16:23   DG Chest 2 View  Result Date: 01/19/2020 CLINICAL DATA:  Shortness of breath, productive cough. Dialysis yesterday. EXAM: CHEST - 2 VIEW COMPARISON:  Chest x-rays dated 01/14/2020 and 01/13/2020. Chest CT angiogram dated 01/14/2020. FINDINGS: Stable cardiomegaly. Lungs are clear. No pleural  effusion or pneumothorax is seen. IMPRESSION: 1. Lungs are clear. No evidence of pneumonia or pulmonary edema on today's exam. 2. Stable cardiomegaly. Electronically Signed   By: Franki Cabot M.D.   On: 01/19/2020 08:22   CT Angio Chest PE W and/or Wo Contrast  Result Date: 01/14/2020 CLINICAL DATA:  Shortness of breath. Soft dialysis prematurely secondary to itching. Recent hospital discharge. EXAM: CT ANGIOGRAPHY CHEST WITH CONTRAST TECHNIQUE: Multidetector CT imaging of the chest was performed using the standard protocol during bolus administration of intravenous contrast. Multiplanar CT image reconstructions and MIPs were obtained to evaluate the vascular anatomy. CONTRAST:  60mL OMNIPAQUE IOHEXOL 350 MG/ML SOLN COMPARISON:  CT chest 12/23/2019 FINDINGS: Cardiovascular:  Satisfactory opacification the pulmonary arteries to the segmental level. No pulmonary artery filling defects are identified. Central pulmonary arteries are normal caliber. Suboptimal opacification of the aorta for luminal evaluation. Atherosclerotic plaque within the normal caliber aorta. Normal 3 vessel branching of the aortic arch. Extensive atheromatous plaque throughout the proximal great vessels as included. Mild cardiomegaly with predominantly biatrial enlargement. Dense calcifications noted upon the coronary arteries as well as upon the mitral annulus and aortic leaflets. Small pericardial effusion. Distension of the azygos. Reflux of contrast into the IVC and hepatic veins. No other major venous abnormalities on this arterial phase imaging. Mediastinum/Nodes: Edematous changes the mediastinum. Normal thyroid gland and thoracic inlet. No acute abnormality of the trachea or esophagus. No worrisome mediastinal, hilar or axillary adenopathy. Lungs/Pleura: Small bilateral pleural effusions with adjacent areas of passive atelectasis. Septal and fissural thickening is present with vascular redistribution. Some dependent areas of ground-glass opacity favoring a combination of passive and dependent atelectasis. No worrisome consolidative opacity. No pneumothorax. Stable biapical pleuroparenchymal scarring. No worrisome nodules or masses. Upper Abdomen: Upper abdominal ascites. No other acute upper abdominal abnormality is visible. Musculoskeletal: Diffuse body wall edema. Osseous manifestations of renal osteodystrophy. No worrisome bone or chest wall lesions. Review of the MIP images confirms the above findings. IMPRESSION: 1. No evidence of acute pulmonary artery filling defects to suggest pulmonary embolism. 2. Cardiomegaly with predominantly biatrial enlargement. Small pericardial effusion. 3. Reflux of contrast into the IVC and hepatic veins, can be seen with right heart failure/fluid overload. 4. Small  bilateral pleural effusions with adjacent areas of passive atelectasis. 5. Fissural and septal thickening with vascular redistribution likely to reflect some interstitial edema. 6. Diffuse body wall edema, upper abdominal ascites, and body wall edema, consistent with anasarca. 7. Osseous manifestations of renal osteodystrophy. 8. Aortic Atherosclerosis (ICD10-I70.0). Electronically Signed   By: Lovena Le M.D.   On: 01/14/2020 21:00   DG Chest Port 1 View  Result Date: 01/25/2020 CLINICAL DATA:  Shortness of breath, end-stage renal disease, hepatitis, cirrhosis EXAM: PORTABLE CHEST 1 VIEW COMPARISON:  01/20/2020 FINDINGS: Single frontal view of the chest demonstrates persistent enlargement of the cardiac silhouette. Diffuse increased interstitial prominence since prior study likely reflects interstitial edema. No airspace disease, effusion, or pneumothorax. No acute bony abnormalities. IMPRESSION: 1. Increased interstitial prominence compatible with interstitial edema. Electronically Signed   By: Randa Ngo M.D.   On: 01/25/2020 22:52   DG Chest Portable 1 View  Result Date: 01/14/2020 CLINICAL DATA:  Shortness of breath. EXAM: PORTABLE CHEST 1 VIEW COMPARISON:  January 13, 2020 FINDINGS: Mild, diffuse, chronic appearing increased lung markings are seen. There is decreased vascular congestion since the prior study. Mild, hazy areas of atelectasis and/or early infiltrate are seen along the periphery of the mid left lung. There is no evidence  of a pleural effusion or pneumothorax. The cardiac silhouette is markedly enlarged. There is mild calcification of the aortic arch. The visualized skeletal structures are unremarkable. IMPRESSION: 1. Mild, hazy areas of atelectasis and/or early infiltrate along the periphery of the mid left lung. 2. Decreased pulmonary vascular congestion when compared to the prior exam. Electronically Signed   By: Virgina Norfolk M.D.   On: 01/14/2020 18:29   DG Chest Portable 1  View  Result Date: 01/13/2020 CLINICAL DATA:  59 year old male with shortness of breath. EXAM: PORTABLE CHEST 1 VIEW COMPARISON:  Chest radiograph dated 12/21/2019 and CT dated 12/23/2019 FINDINGS: There is moderate enlargement of the cardiopericardial silhouette similar to the radiograph of 12/21/2019. There is central vascular prominence consistent with congestion. Trace bilateral pleural effusions noted. There are bibasilar atelectatic changes. No lobar consolidation or pneumothorax. Atherosclerotic calcification of the aorta and branches of the aortic arch. No acute osseous pathology. IMPRESSION: 1. Cardiomegaly with vascular congestion and possible mild edema. Overall slight interval worsening of the congestion since the radiograph of 12/21/2019. 2. Advanced atherosclerotic calcification of the aorta and branches of the aortic arch. Electronically Signed   By: Anner Crete M.D.   On: 01/13/2020 16:32   [place] ASCITES (ABDOMEN LIMITED)  Result Date: 01/14/2020 CLINICAL DATA:  Assess ascites. Multiple previous paracenteses due to cirrhosis. EXAM: LIMITED ABDOMEN ULTRASOUND FOR ASCITES TECHNIQUE: Limited ultrasound survey for ascites was performed in all four abdominal quadrants. COMPARISON:  12/22/2019 FINDINGS: Examination demonstrates mild-to-moderate amount of ascites left greater than right. Ascites over the right abdomen is improved compared to the images from the previous exam. IMPRESSION: Mild-to-moderate ascites left greater than right. Electronically Signed   By: Marin Olp M.D.   On: 01/14/2020 21:52   IR Paracentesis  Result Date: 01/24/2020 INDICATION: End-stage renal disease, cirrhosis, hepatitis B and C, recurrent ascites. Request for therapeutic paracentesis EXAM: ULTRASOUND GUIDED PARACENTESIS MEDICATIONS: 1% lidocaine 15 mL COMPLICATIONS: None immediate. PROCEDURE: Informed written consent was obtained from the patient after a discussion of the risks, benefits and alternatives to  treatment. A timeout was performed prior to the initiation of the procedure. Initial ultrasound scanning demonstrates a moderate amount of ascites within the left lower abdominal quadrant. The left lower abdomen was prepped and draped in the usual sterile fashion. 1% lidocaine was used for local anesthesia. Following this, a 6 Fr Safe-T-Centesis catheter was introduced. An ultrasound image was saved for documentation purposes. The paracentesis was performed. The catheter was removed and a dressing was applied. The patient tolerated the procedure well without immediate post procedural complication. FINDINGS: A total of approximately 1.4 L of amber fluid was removed. IMPRESSION: Successful ultrasound-guided paracentesis yielding 1.4 liters of peritoneal fluid. Read by: Gareth Eagle, PA-C Electronically Signed   By: Aletta Edouard M.D.   On: 01/24/2020 14:49   IR Paracentesis  Result Date: 01/17/2020 INDICATION: Patient with history end-stage renal disease, cirrhosis, hepatitis B/C, recurrent ascites ;request made for therapeutic paracentesis EXAM: ULTRASOUND GUIDED THERAPEUTIC  PARACENTESIS MEDICATIONS: None COMPLICATIONS: None immediate. PROCEDURE: Informed written consent was obtained from the patient after a discussion of the risks, benefits and alternatives to treatment. A timeout was performed prior to the initiation of the procedure. Initial ultrasound scanning demonstrates a moderate amount of ascites within the left lower abdominal quadrant. The left lower abdomen was prepped and draped in the usual sterile fashion. 1% lidocaine was used for local anesthesia. Following this, a 6 Fr Safe-T-Centesis catheter was introduced. An ultrasound image was saved for documentation purposes.  The paracentesis was performed. The catheter was removed and a dressing was applied. The patient tolerated the procedure well without immediate post procedural complication. FINDINGS: A total of approximately 3.2 liters of  hazy,amber fluid was removed. IMPRESSION: Successful ultrasound-guided therapeutic paracentesis yielding 3.2 liters of peritoneal fluid. Read by: Lindaann Pascal Electronically Signed   By: Markus Daft M.D.   On: 01/17/2020 13:42   IR Paracentesis  Result Date: 01/10/2020 INDICATION: Patient with a history of cirrhosis and recurrent ascites. Interventional radiology asked to perform a therapeutic paracentesis. EXAM: ULTRASOUND GUIDED PARACENTESIS MEDICATIONS: 1% lidocaine 20 mL COMPLICATIONS: None immediate. PROCEDURE: Informed written consent was obtained from the patient after a discussion of the risks, benefits and alternatives to treatment. A timeout was performed prior to the initiation of the procedure. Initial ultrasound scanning demonstrates a large amount of ascites within the right lower abdominal quadrant. The right lower abdomen was prepped and draped in the usual sterile fashion. 1% lidocaine was used for local anesthesia. Following this, a 19 gauge, 7-cm, Yueh catheter was introduced. A strong fluid return was obtained but when attached to suction the flow stopped. Multiple attempts at manipulating the catheter were made without success. Ultrasound imaging continued to demonstrate a large amount of ascites in the right lower quadrant. The catheter was removed and a second 19 gauge, 7-cm Yueh was inserted with the same results. The catheter was removed and a dressing was applied. Ultrasound scanning demonstrated a large amount of ascites within the left lower abdominal quadrant. The left lower abdomen was prepped and draped in the usual sterile fashion. 1% lidocaine was used for local anesthesia. A 19 gauge, 7-cm Teressa Lower was inserted with a strong fluid return but when attached to suction the flow stopped. Multiple attempts at manipulating the catheter were made without success. The catheter was removed. A 6 French Safe-T-Centesis was then inserted with expected results. An ultrasound image was saved  for documentation purposes. The paracentesis was performed. The catheter was removed and a dressing was applied. The patient tolerated the procedure well without immediate post procedural complication. FINDINGS: A total of approximately 3 L of light pink fluid was removed. IMPRESSION: Successful ultrasound-guided paracentesis yielding 3 liters of peritoneal fluid. Read by: Soyla Dryer, NP Electronically Signed   By: Markus Daft M.D.   On: 01/10/2020 17:15   IR Paracentesis  Result Date: 01/03/2020 INDICATION: Cirrhosis with recurrent ascites. Request for diagnostic and therapeutic paracentesis. EXAM: ULTRASOUND GUIDED PARACENTESIS MEDICATIONS: 1% lidocaine 15 mL COMPLICATIONS: None immediate. PROCEDURE: Informed written consent was obtained from the patient after a discussion of the risks, benefits and alternatives to treatment. A timeout was performed prior to the initiation of the procedure. Initial ultrasound scanning demonstrates a moderate amount of ascites within the left lower abdominal quadrant. The left lower abdomen was prepped and draped in the usual sterile fashion. 1% lidocaine was used for local anesthesia. Following this, a 19 gauge, 7-cm, Yueh catheter was introduced. An ultrasound image was saved for documentation purposes. The paracentesis was performed. The catheter was removed and a dressing was applied. The patient tolerated the procedure well without immediate post procedural complication. FINDINGS: A total of approximately 2.9 L of red fluid was removed. Samples were sent to the laboratory as requested by the clinical team. IMPRESSION: Successful ultrasound-guided paracentesis yielding 2.9 liters of peritoneal fluid. Read by: Gareth Eagle, PA-C Electronically Signed   By: Markus Daft M.D.   On: 01/03/2020 14:33       Subjective:  Patient seen  and examined at the bedside this morning.  Hemodynamically stable for discharge today. Discharge Exam: Vitals:   01/29/20 1030 01/29/20  1125  BP: 120/78 105/61  Pulse: 94 82  Resp: (!) 21 20  Temp: 97.7 F (36.5 C) 98.1 F (36.7 C)  SpO2: 97% 95%   Vitals:   01/29/20 0930 01/29/20 1000 01/29/20 1030 01/29/20 1125  BP: 97/67 108/74 120/78 105/61  Pulse: 80 91 94 82  Resp:   (!) 21 20  Temp:   97.7 F (36.5 C) 98.1 F (36.7 C)  TempSrc:   Oral Oral  SpO2:   97% 95%  Weight:   69.9 kg     General: Pt is alert, awake, not in acute distress Cardiovascular: RRR, S1/S2 +, no rubs, no gallops Respiratory: CTA bilaterally, no wheezing, no rhonchi Abdominal: Soft, NT, ND, bowel sounds + Extremities: no edema, no cyanosis    The results of significant diagnostics from this hospitalization (including imaging, microbiology, ancillary and laboratory) are listed below for reference.     Microbiology: Recent Results (from the past 240 hour(s))  SARS Coronavirus 2 by RT PCR (hospital order, performed in [facility] hospital lab) Nasopharyngeal Nasopharyngeal Swab     Status: None   Collection Time: 01/26/20 12:47 AM   Specimen: Nasopharyngeal Swab  Result Value Ref Range Status   SARS Coronavirus 2 NEGATIVE NEGATIVE Final    Comment: (NOTE) SARS-CoV-2 target nucleic acids are NOT DETECTED.  The SARS-CoV-2 RNA is generally detectable in upper and lower respiratory specimens during the acute phase of infection. The lowest concentration of SARS-CoV-2 viral copies this assay can detect is 250 copies / mL. A negative result does not preclude SARS-CoV-2 infection and should not be used as the sole basis for treatment or other patient management decisions.  A negative result may occur with improper specimen collection / handling, submission of specimen other than nasopharyngeal swab, presence of viral mutation(s) within the areas targeted by this assay, and inadequate number of viral copies (<250 copies / mL). A negative result must be combined with clinical observations, patient history, and epidemiological  information.  Fact Sheet for Patients:   StrictlyIdeas.no  Fact Sheet for Healthcare Providers: BankingDealers.co.za  This test is not yet approved or  cleared by the [place] FDA and has been authorized for detection and/or diagnosis of SARS-CoV-2 by FDA under an Emergency Use Authorization (EUA).  This EUA will remain in effect (meaning this test can be used) for the duration of the COVID-19 declaration under Section 564(b)(1) of the Act, 21 U.S.C. section 360bbb-3(b)(1), unless the authorization is terminated or revoked sooner.  Performed at [facility], [place] [address] [place], [place] [address]   MRSA PCR Screening     Status: None   Collection Time: 01/27/20  6:37 AM   Specimen: Nasopharyngeal  Result Value Ref Range Status   MRSA by PCR NEGATIVE NEGATIVE Final    Comment:        The GeneXpert MRSA Assay (FDA approved for NASAL specimens only), is one component of a comprehensive MRSA colonization surveillance program. It is not intended to diagnose MRSA infection nor to guide or monitor treatment for MRSA infections. Performed at [facility], [place] [address], [place], [place] [address]      Labs: BNP (last 3 results) Recent Labs    10/06/19 1159 11/12/19 2341 01/14/20 1927  BNP 2,690.1* 3,672.9* 0,981.1*   Basic Metabolic Panel: Recent Labs  Lab 01/25/20 2218 01/26/20 0221 01/26/20  1146 01/27/20 0557 01/27/20 1208 01/27/20 1429 01/28/20 0504 01/29/20 0451  NA 133*  --   --  128*  --   --  136 135  K 5.9*   < >  --  6.8* 6.4* 4.4 5.0 5.1  CL 89*  --   --  85*  --   --  92* 89*  CO2 29  --   --  26  --   --  29 27  GLUCOSE 102*  --   --  167*  --   --  143* 155*  BUN 28*  --   --  54*  --   --  48* 70*  CREATININE 3.94*  --   --  5.70*  --   --  5.01* 6.25*  CALCIUM 9.5  --   --  10.1  --   --  9.8 10.3  MG  --   --  2.8* 2.7*  --   --  2.1  --   PHOS  --   --  6.8*  --   --   --    --   --    < > = values in this interval not displayed.   Liver Function Tests: Recent Labs  Lab 01/25/20 2218 01/27/20 0557 01/28/20 0504  AST 73* 58* 54*  ALT 64* 63* 68*  ALKPHOS 364* 380* 345*  BILITOT 1.0 1.2 1.2  PROT 6.5 7.5 6.9  ALBUMIN 2.6* 3.0* 2.8*   No results for input(s): LIPASE, AMYLASE in the last 168 hours. No results for input(s): AMMONIA in the last 168 hours. CBC: Recent Labs  Lab 01/25/20 2218 01/27/20 0557 01/28/20 0504 01/29/20 0451  WBC 12.0* 18.5* 17.0* 17.6*  NEUTROABS 8.8*  --   --   --   HGB 12.2* 12.0* 11.8* 11.9*  HCT 35.6* 34.8* 33.9* 34.3*  MCV 80.0 78.9* 80.1 79.8*  PLT 232 229 205 229   Cardiac Enzymes: No results for input(s): CKTOTAL, CKMB, CKMBINDEX, TROPONINI in the last 168 hours. BNP: Invalid input(s): POCBNP CBG: Recent Labs  Lab 01/28/20 1635 01/28/20 2028 01/29/20 0012 01/29/20 0349 01/29/20 1124  GLUCAP 150* 153* 148* 133* 122*   D-Dimer No results for input(s): DDIMER in the last 72 hours. Hgb A1c Recent Labs    01/26/20 1146  HGBA1C 4.8   Lipid Profile No results for input(s): CHOL, HDL, LDLCALC, TRIG, CHOLHDL, LDLDIRECT in the last 72 hours. Thyroid function studies No results for input(s): TSH, T4TOTAL, T3FREE, THYROIDAB in the last 72 hours.  Invalid input(s): FREET3 Anemia work up No results for input(s): VITAMINB12, FOLATE, FERRITIN, TIBC, IRON, RETICCTPCT in the last 72 hours. Urinalysis    Component Value Date/Time   COLORURINE YELLOW 07/16/2011 1619   APPEARANCEUR CLEAR 07/16/2011 1619   LABSPEC 1.018 07/16/2011 1619   PHURINE 7.5 07/16/2011 1619   GLUCOSEU 250 (A) 07/16/2011 1619   HGBUR MODERATE (A) 07/16/2011 1619   BILIRUBINUR SMALL (A) 07/16/2011 1619   KETONESUR NEGATIVE 07/16/2011 1619   PROTEINUR >300 (A) 07/16/2011 1619   UROBILINOGEN 1.0 07/16/2011 1619   NITRITE NEGATIVE 07/16/2011 1619   LEUKOCYTESUR SMALL (A) 07/16/2011 1619   Sepsis Labs Invalid input(s): PROCALCITONIN,   WBC,  LACTICIDVEN Microbiology Recent Results (from the past 240 hour(s))  SARS Coronavirus 2 by RT PCR (hospital order, performed in [place] hospital lab) Nasopharyngeal Nasopharyngeal Swab     Status: None   Collection Time: 01/26/20 12:47 AM   Specimen: Nasopharyngeal Swab  Result Value Ref  Range Status   SARS Coronavirus 2 NEGATIVE NEGATIVE Final    Comment: (NOTE) SARS-CoV-2 target nucleic acids are NOT DETECTED.  The SARS-CoV-2 RNA is generally detectable in upper and lower respiratory specimens during the acute phase of infection. The lowest concentration of SARS-CoV-2 viral copies this assay can detect is 250 copies / mL. A negative result does not preclude SARS-CoV-2 infection and should not be used as the sole basis for treatment or other patient management decisions.  A negative result may occur with improper specimen collection / handling, submission of specimen other than nasopharyngeal swab, presence of viral mutation(s) within the areas targeted by this assay, and inadequate number of viral copies (<250 copies / mL). A negative result must be combined with clinical observations, patient history, and epidemiological information.  Fact Sheet for Patients:   StrictlyIdeas.no  Fact Sheet for Healthcare Providers: BankingDealers.co.za  This test is not yet approved or  cleared by the [place] FDA and has been authorized for detection and/or diagnosis of SARS-CoV-2 by FDA under an Emergency Use Authorization (EUA).  This EUA will remain in effect (meaning this test can be used) for the duration of the COVID-19 declaration under Section 564(b)(1) of the Act, 21 U.S.C. section 360bbb-3(b)(1), unless the authorization is terminated or revoked sooner.  Performed at [facility], [place] [address], [place], [place] [address]   MRSA PCR Screening     Status: None   Collection Time: 01/27/20  6:37 AM   Specimen:  Nasopharyngeal  Result Value Ref Range Status   MRSA by PCR NEGATIVE NEGATIVE Final    Comment:        The GeneXpert MRSA Assay (FDA approved for NASAL specimens only), is one component of a comprehensive MRSA colonization surveillance program. It is not intended to diagnose MRSA infection nor to guide or monitor treatment for MRSA infections. Performed at [facility], [place] [address], [place], [place] [address]     Please note: You were cared for by a hospitalist during your hospital stay. Once you are discharged, your primary care physician will handle any further medical issues. Please note that NO REFILLS for any discharge medications will be authorized once you are discharged, as it is imperative that you return to your primary care physician (or establish a relationship with a primary care physician if you do not have one) for your post hospital discharge needs so that they can reassess your need for medications and monitor your lab values.    Time coordinating discharge: 40 minutes  SIGNED:   Shelly Coss, MD  [facility] 01/29/2020, 11:34 AM Pager [phone]  If 7PM-7AM, please contact night-coverage [email or web address] Password [email or web address]

## 2020-01-29 NOTE — Progress Notes (Signed)
[facility] Progress Note   Subjective: Seen in dialysis unit, completing treatment today. UF goal 2L. He is eating graham crackers during treatment. He's asking about oxygen. Denies cp, palpitations.   Objective Vitals:   01/29/20 0830 01/29/20 0900 01/29/20 0930 01/29/20 1000  BP: (!) 92/52 (!) 84/66 97/67 108/74  Pulse: 73 96 80 91  Resp:      Temp:      TempSrc:      SpO2:      Weight:       Physical Exam General: chronically ill appearing male in NAD Heart: irreg, irreg.  Lungs: bilateral breath sounds slightly decreased in bases few inspiratory wheezes Abdomen: distended, ascites present. Active BS Extremities: No LE edema Dialysis Access: R AVG + bruit. Abraded areas on AVG from previous needle sticks in same location.   Additional Objective Labs: Basic Metabolic Panel: Recent Labs  Lab 01/25/20 2218 01/26/20 1146 01/27/20 0557 01/27/20 1208 01/27/20 1429 01/28/20 0504 01/29/20 0451  NA   < >  --  128*  --   --  136 135  K   < >  --  6.8*   < > 4.4 5.0 5.1  CL   < >  --  85*  --   --  92* 89*  CO2   < >  --  26  --   --  29 27  GLUCOSE   < >  --  167*  --   --  143* 155*  BUN   < >  --  54*  --   --  48* 70*  CREATININE   < >  --  5.70*  --   --  5.01* 6.25*  CALCIUM   < >  --  10.1  --   --  9.8 10.3  PHOS  --  6.8*  --   --   --   --   --    < > = values in this interval not displayed.   Liver Function Tests: Recent Labs  Lab 01/25/20 2218 01/27/20 0557 01/28/20 0504  AST 73* 58* 54*  ALT 64* 63* 68*  ALKPHOS 364* 380* 345*  BILITOT 1.0 1.2 1.2  PROT 6.5 7.5 6.9  ALBUMIN 2.6* 3.0* 2.8*   No results for input(s): LIPASE, AMYLASE in the last 168 hours. CBC: Recent Labs  Lab 01/25/20 2218 01/25/20 2218 01/27/20 0557 01/28/20 0504 01/29/20 0451  WBC 12.0*   < > 18.5* 17.0* 17.6*  NEUTROABS 8.8*  --   --   --   --   HGB 12.2*   < > 12.0* 11.8* 11.9*  HCT 35.6*   < > 34.8* 33.9* 34.3*  MCV 80.0  --  78.9* 80.1 79.8*  PLT 232    < > 229 205 229   < > = values in this interval not displayed.   Blood Culture    Component Value Date/Time   SDES BLOOD LEFT HAND 01/14/2020 0023   SPECREQUEST  01/14/2020 0023    BOTTLES DRAWN AEROBIC ONLY Blood Culture adequate volume   CULT  01/14/2020 0023    NO GROWTH 5 DAYS Performed at [facility], [place] [address], [place], [place] [address]    REPTSTATUS 01/19/2020 FINAL 01/14/2020 0023    Cardiac Enzymes: No results for input(s): CKTOTAL, CKMB, CKMBINDEX, TROPONINI in the last 168 hours. CBG: Recent Labs  Lab 01/28/20 1157 01/28/20 1635 01/28/20 2028 01/29/20 0012 01/29/20 0349  GLUCAP 166* 150* 153*  148* 133*   Iron Studies: No results for input(s): IRON, TIBC, TRANSFERRIN, FERRITIN in the last 72 hours. @lablastinr3 @ Studies/Results: No results found. Medications:  . amiodarone  200 mg Oral Daily  . gabapentin  300 mg Oral BID  . heparin  5,000 Units Subcutaneous Q8H  . insulin aspart  0-6 Units Subcutaneous Q4H  . ipratropium-albuterol  3 mL Nebulization Once  . metoprolol tartrate  12.5 mg Oral BID  . predniSONE  40 mg Oral Q breakfast  . sevelamer carbonate  2,400 mg Oral TID WC   HD orders:  [facility] MWF 4 hrs 180NRe 450/800 61 kg 2.0K/2.0 Ca R AVG -No heparin  -Calcitriol 0.75 mcg PO TIW  Assessment/Plan: 1.  Acute on chronic respiratory failure-COPD exacerbation/Volume overload. Improved with HD. Per primary.  2. PAF- Started on Diltiazem gtt per cards. Not on anticoagulation D/T noncompliance.  3. Hyperkalemia-K+ 5.1 --> HD today on 2.0 K bath.  4. ESRD- HD MWF. Truncates treatments. HD off schedule today --back on schedule Wed. 5. Secondary hyperparathyroidism - Continue binders, VDRA 6. Anemia-HGB at goal . No ESA needed. Follow HGB.  7. HTN/volume - Volume improved post HD 07/18. Net UF 2.1 L Still above OP EDW. Needs standing wt. UF today as tolerated 8. Nutrition - Albumin low in setting of cirrhosis. Renal diet. Protein supps.  9.  Cirrhosis with recurrent ascites. Last paracentesis 07/15 1.4 liters. Per primary.  10. Noncompliance with HD-truncates treatments, rarely, if ever runs full treatments. Discussed with pt-he always says he will stay full treatment but this doesn't happen.   Lynnda Child PA-C [facility] 01/29/2020,10:39 AM

## 2020-01-29 NOTE — Discharge Instructions (Signed)

## 2020-01-29 NOTE — Progress Notes (Signed)
Pt ambulated in hall with RN on RA. No complaints or distress noted. Oxygen sat. Remained >95%.

## 2020-01-31 ENCOUNTER — Other Ambulatory Visit: Payer: Self-pay

## 2020-01-31 ENCOUNTER — Ambulatory Visit (HOSPITAL_COMMUNITY)
Admission: RE | Admit: 2020-01-31 | Discharge: 2020-01-31 | Disposition: A | Discharge status: Home / Self Care | Payer: Medicare Other | Source: Ambulatory Visit | Attending: Gastroenterology | Admitting: Gastroenterology

## 2020-01-31 DIAGNOSIS — R188 Other ascites: Secondary | ICD-10-CM | POA: Insufficient documentation

## 2020-01-31 DIAGNOSIS — K7460 Unspecified cirrhosis of liver: Secondary | ICD-10-CM | POA: Insufficient documentation

## 2020-01-31 HISTORY — PX: IR PARACENTESIS: IMG2679

## 2020-01-31 MED ORDER — LIDOCAINE HCL (PF) 1 % IJ SOLN
INTRAMUSCULAR | Status: DC | PRN
  Administered 2020-01-31: 10 mL

## 2020-01-31 MED ORDER — LIDOCAINE HCL 1 % IJ SOLN
INTRAMUSCULAR | Status: AC
  Filled 2020-01-31: qty 20

## 2020-01-31 NOTE — Procedures (Signed)
PROCEDURE SUMMARY:  Successful US guided paracentesis from left lateral abdomen.  Yielded 5.0 liters of yellow fluid.  No immediate complications.  Pt tolerated well.   Specimen was not sent for labs.  EBL < 51mL  Docia Barrier PA-C 01/31/2020 2:41 PM

## 2020-02-02 IMAGING — CT CT ABD-PELV W/ CM
2 of 5 series · 16 of 46 positions shown, 18 images · IV contrast (Omni 300)
Comparison: CT of the abdomen pelvis dated 07/22/2018

CLINICAL DATA: 57-year-old male with abdominal pain and nausea.
History of colonic adenoma and hepatitis.

EXAM:
CT ABDOMEN AND PELVIS WITH CONTRAST
TECHNIQUE: Multidetector CT imaging of the abdomen and pelvis was performed
using the standard protocol following bolus administration of
intravenous contrast.
CONTRAST:  100mL OMNIPAQUE IOHEXOL 300 MG/ML  SOLN

[Series 3: a/p w/ 5mm · axial · 0.80mm/px · z∈[+739,+1189]mm · 13 of 102 slices shown, 15 images]
[im 6/102  soft-tissue]
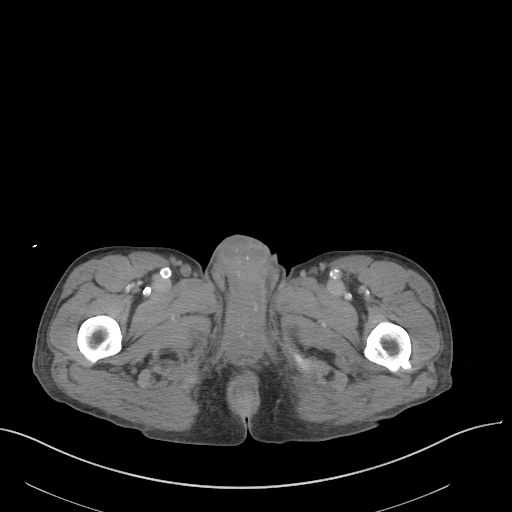
[im 6/102  bone]
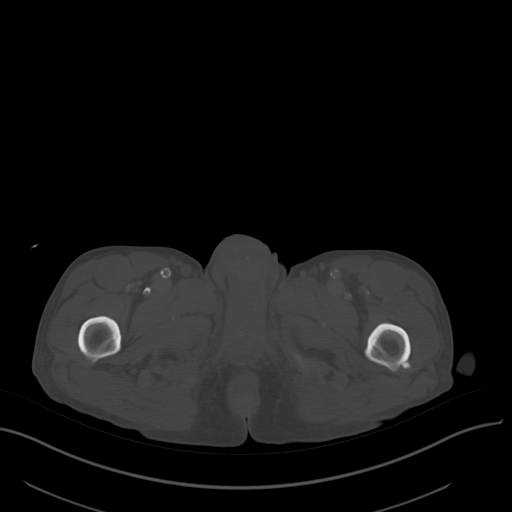
[im 16/102  soft-tissue]
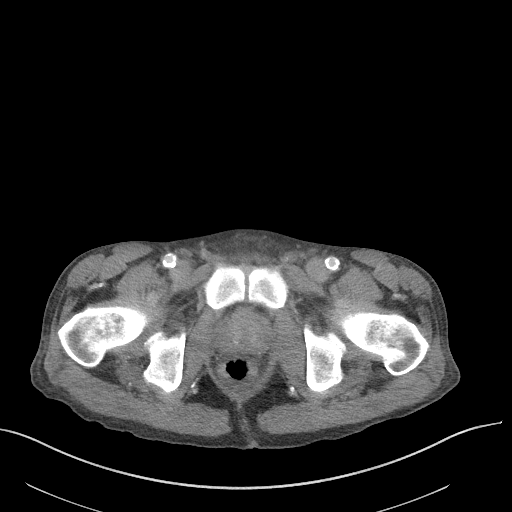
[im 21/102  soft-tissue]
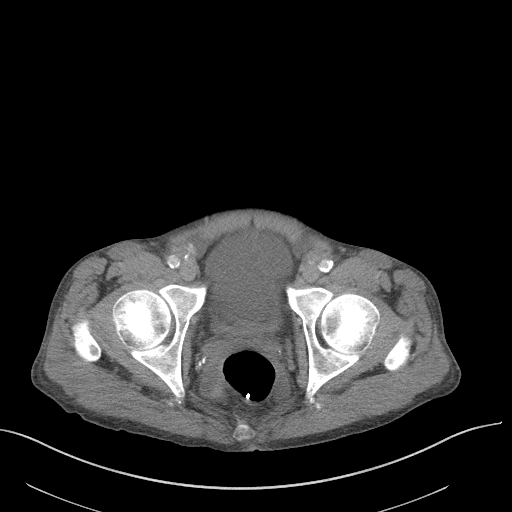
[im 31/102  soft-tissue]
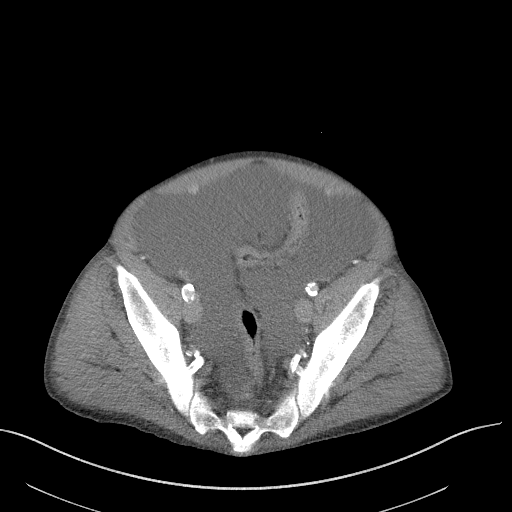
[im 36/102  soft-tissue]
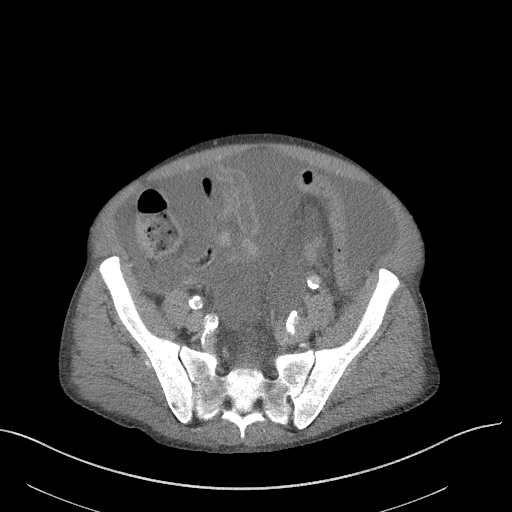
[im 46/102  soft-tissue]
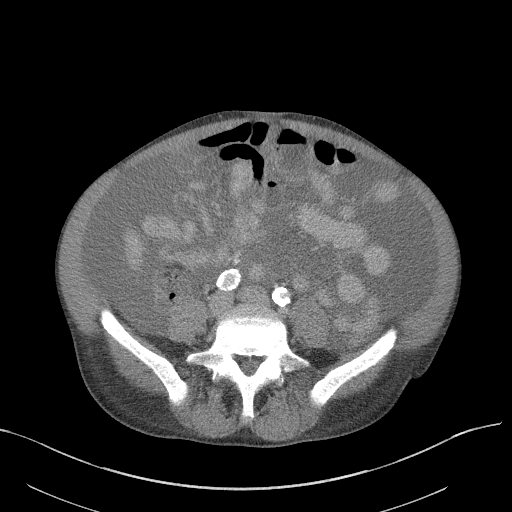
[im 51/102  soft-tissue]
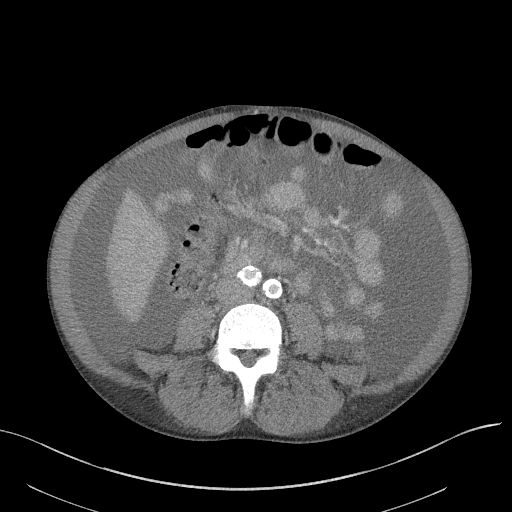
[im 56/102  soft-tissue]
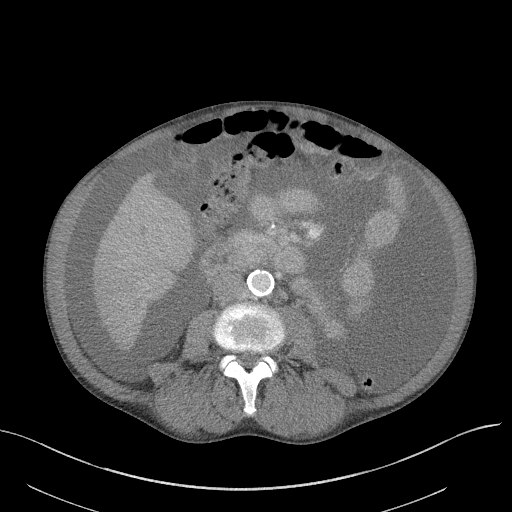
[im 66/102  soft-tissue]
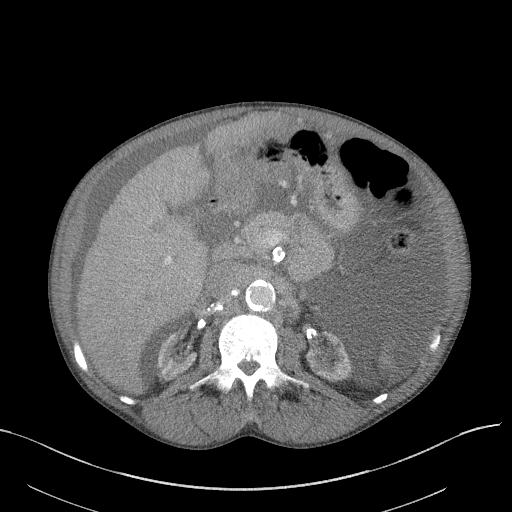
[im 66/102  bone]
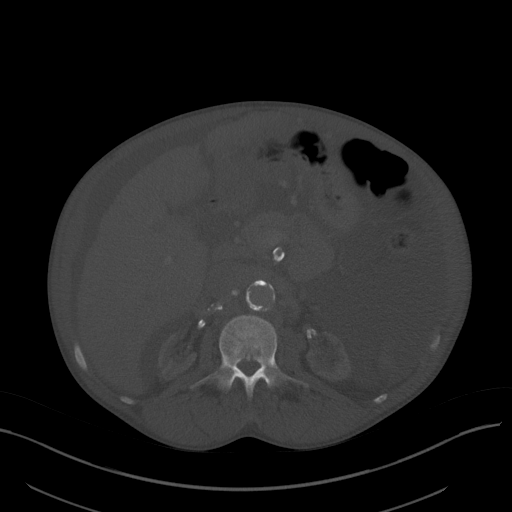
[im 71/102  soft-tissue]
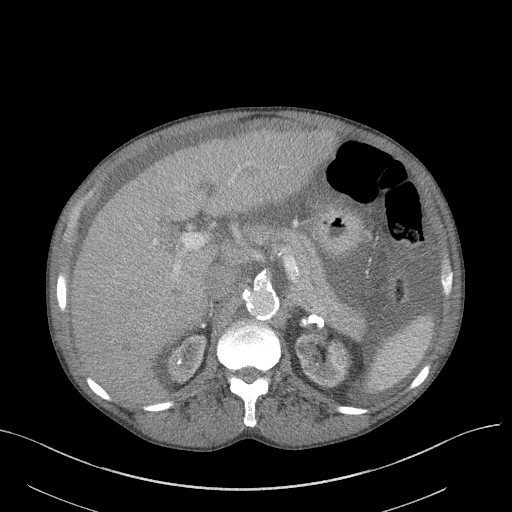
[im 81/102  soft-tissue]
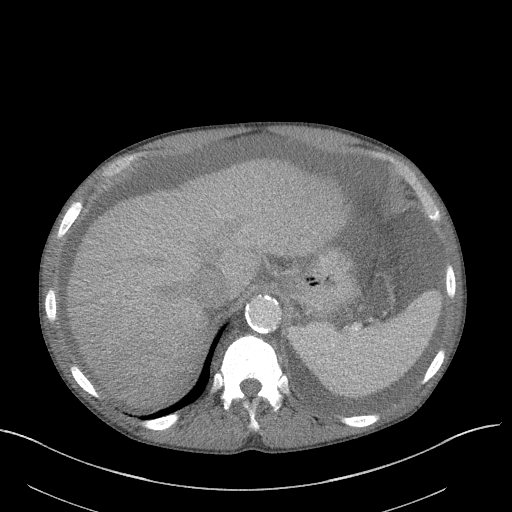
[im 86/102  soft-tissue]
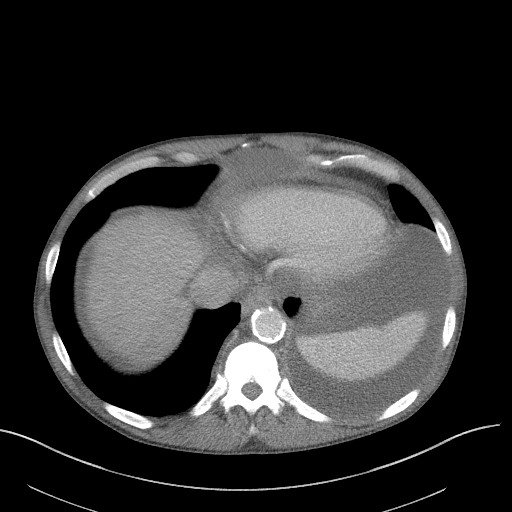
[im 96/102  soft-tissue]
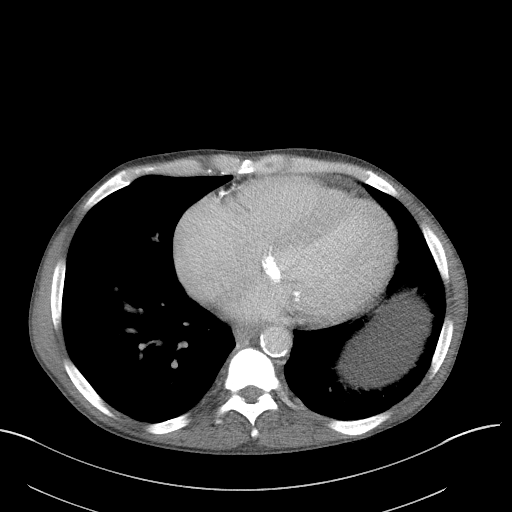

[Series 6: a/p w/ cor · coronal · 0.75mm/px · 3 of 149 slices shown]
[im 50/149  soft-tissue]
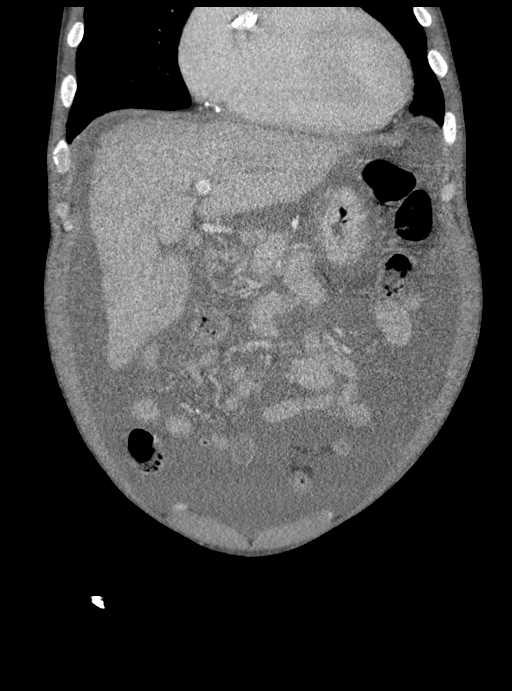
[im 66/149  soft-tissue]
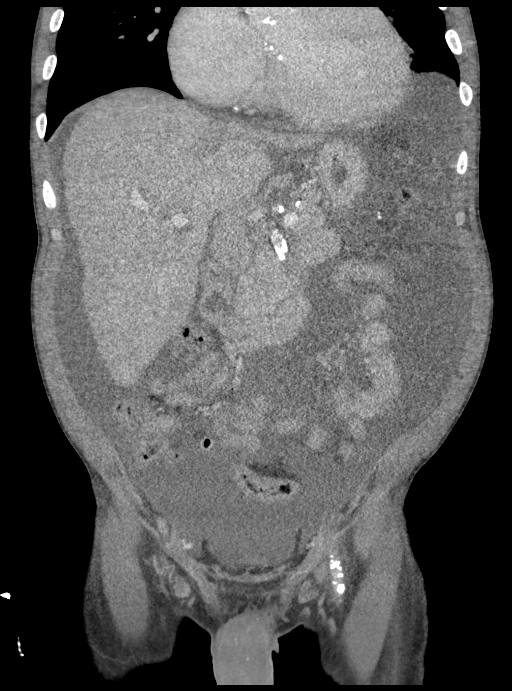
[im 83/149  soft-tissue]
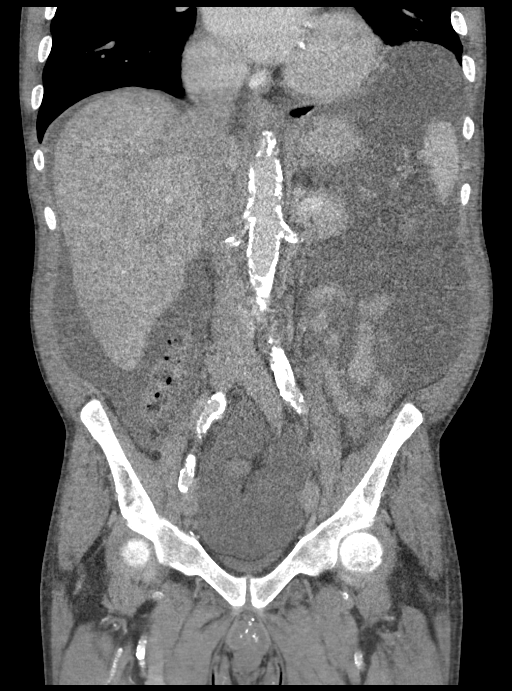

[16 of 46 positions shown; findings below may reference images not displayed]

FINDINGS: Lower chest: There is moderate cardiomegaly. Pericardial effusion
anterior to the heart measures approximately 10 mm in thickness.
Coronary vascular calcification noted.

There is no intra-abdominal free air. There is a moderate ascites

Hepatobiliary: Morphologic changes of cirrhosis. The gallbladder is
unremarkable.

Pancreas: Unremarkable. No pancreatic ductal dilatation or
surrounding inflammatory changes.

Spleen: Normal in size without focal abnormality.

Adrenals/Urinary Tract: The adrenal glands are unremarkable.
Atrophic kidneys. Multiple bilateral renal hypodense lesions are too
small to characterize. The urinary bladder is predominantly
collapsed.

Stomach/Bowel: There is no bowel obstruction. The appendix is
unremarkable.

Vascular/Lymphatic: Advanced aortoiliac atherosclerotic disease.
There is severe renal vascular calcification as well as
atherosclerotic calcification of the SMA. The splenic vein and main
portal vein appear patent. No portal venous gas. No adenopathy.

Reproductive: The prostate and seminal vesicles are grossly
unremarkable.

Other: Mild diffuse subcutaneous edema.

Musculoskeletal: Diffuse osteosclerosis consistent with renal
osteodystrophy. L5-S1 disc desiccation and vacuum phenomena. No
acute osseous pathology.
IMPRESSION: 1. Cirrhosis with evidence of portal hypertension and moderate
ascites.
2. No bowel obstruction. Normal appendix.
3. Atrophic kidneys with renal osteodystrophy.
4. Cardiomegaly.
5. Advanced Aortic Atherosclerosis ([id number]).

## 2020-02-03 ENCOUNTER — Inpatient Hospital Stay (HOSPITAL_COMMUNITY)
Admission: EM | Admit: 2020-02-03 | Discharge: 2020-02-05 | DRG: 640 | Discharge status: Against Medical Advice | Payer: Medicare Other | Attending: Internal Medicine | Admitting: Internal Medicine

## 2020-02-03 ENCOUNTER — Encounter (HOSPITAL_COMMUNITY): Payer: Self-pay

## 2020-02-03 ENCOUNTER — Emergency Department (HOSPITAL_COMMUNITY): Payer: Medicare Other

## 2020-02-03 DIAGNOSIS — G894 Chronic pain syndrome: Secondary | ICD-10-CM | POA: Diagnosis present

## 2020-02-03 DIAGNOSIS — Z9115 Patient's noncompliance with renal dialysis: Secondary | ICD-10-CM

## 2020-02-03 DIAGNOSIS — D631 Anemia in chronic kidney disease: Secondary | ICD-10-CM | POA: Diagnosis present

## 2020-02-03 DIAGNOSIS — Z5329 Procedure and treatment not carried out because of patient's decision for other reasons: Secondary | ICD-10-CM | POA: Diagnosis present

## 2020-02-03 DIAGNOSIS — K7460 Unspecified cirrhosis of liver: Secondary | ICD-10-CM | POA: Diagnosis present

## 2020-02-03 DIAGNOSIS — E875 Hyperkalemia: Secondary | ICD-10-CM

## 2020-02-03 DIAGNOSIS — I2781 Cor pulmonale (chronic): Secondary | ICD-10-CM | POA: Diagnosis present

## 2020-02-03 DIAGNOSIS — I4891 Unspecified atrial fibrillation: Secondary | ICD-10-CM | POA: Diagnosis present

## 2020-02-03 DIAGNOSIS — I43 Cardiomyopathy in diseases classified elsewhere: Secondary | ICD-10-CM | POA: Diagnosis present

## 2020-02-03 DIAGNOSIS — K219 Gastro-esophageal reflux disease without esophagitis: Secondary | ICD-10-CM | POA: Diagnosis present

## 2020-02-03 DIAGNOSIS — I2510 Atherosclerotic heart disease of native coronary artery without angina pectoris: Secondary | ICD-10-CM | POA: Diagnosis present

## 2020-02-03 DIAGNOSIS — R778 Other specified abnormalities of plasma proteins: Secondary | ICD-10-CM | POA: Diagnosis present

## 2020-02-03 DIAGNOSIS — M19012 Primary osteoarthritis, left shoulder: Secondary | ICD-10-CM | POA: Diagnosis present

## 2020-02-03 DIAGNOSIS — Z955 Presence of coronary angioplasty implant and graft: Secondary | ICD-10-CM

## 2020-02-03 DIAGNOSIS — E8770 Fluid overload, unspecified: Principal | ICD-10-CM | POA: Diagnosis present

## 2020-02-03 DIAGNOSIS — I272 Pulmonary hypertension, unspecified: Secondary | ICD-10-CM | POA: Diagnosis present

## 2020-02-03 DIAGNOSIS — I959 Hypotension, unspecified: Secondary | ICD-10-CM | POA: Diagnosis present

## 2020-02-03 DIAGNOSIS — I252 Old myocardial infarction: Secondary | ICD-10-CM

## 2020-02-03 DIAGNOSIS — N186 End stage renal disease: Secondary | ICD-10-CM | POA: Diagnosis present

## 2020-02-03 DIAGNOSIS — E8889 Other specified metabolic disorders: Secondary | ICD-10-CM | POA: Diagnosis present

## 2020-02-03 DIAGNOSIS — Z886 Allergy status to analgesic agent status: Secondary | ICD-10-CM

## 2020-02-03 DIAGNOSIS — I1311 Hypertensive heart and chronic kidney disease without heart failure, with stage 5 chronic kidney disease, or end stage renal disease: Secondary | ICD-10-CM | POA: Diagnosis present

## 2020-02-03 DIAGNOSIS — J9601 Acute respiratory failure with hypoxia: Secondary | ICD-10-CM | POA: Diagnosis present

## 2020-02-03 DIAGNOSIS — R188 Other ascites: Secondary | ICD-10-CM | POA: Diagnosis present

## 2020-02-03 DIAGNOSIS — B181 Chronic viral hepatitis B without delta-agent: Secondary | ICD-10-CM | POA: Diagnosis present

## 2020-02-03 DIAGNOSIS — J449 Chronic obstructive pulmonary disease, unspecified: Secondary | ICD-10-CM | POA: Diagnosis present

## 2020-02-03 DIAGNOSIS — I447 Left bundle-branch block, unspecified: Secondary | ICD-10-CM | POA: Diagnosis present

## 2020-02-03 DIAGNOSIS — N2581 Secondary hyperparathyroidism of renal origin: Secondary | ICD-10-CM | POA: Diagnosis present

## 2020-02-03 DIAGNOSIS — Z992 Dependence on renal dialysis: Secondary | ICD-10-CM

## 2020-02-03 DIAGNOSIS — Z20822 Contact with and (suspected) exposure to covid-19: Secondary | ICD-10-CM | POA: Diagnosis present

## 2020-02-03 DIAGNOSIS — B182 Chronic viral hepatitis C: Secondary | ICD-10-CM | POA: Diagnosis present

## 2020-02-03 DIAGNOSIS — I4819 Other persistent atrial fibrillation: Secondary | ICD-10-CM | POA: Diagnosis present

## 2020-02-03 DIAGNOSIS — Z87442 Personal history of urinary calculi: Secondary | ICD-10-CM

## 2020-02-03 DIAGNOSIS — I313 Pericardial effusion (noninflammatory): Secondary | ICD-10-CM | POA: Diagnosis present

## 2020-02-03 DIAGNOSIS — Z885 Allergy status to narcotic agent status: Secondary | ICD-10-CM

## 2020-02-03 DIAGNOSIS — I081 Rheumatic disorders of both mitral and tricuspid valves: Secondary | ICD-10-CM | POA: Diagnosis present

## 2020-02-03 LAB — BASIC METABOLIC PANEL
Anion gap: 17 — ABNORMAL HIGH (ref 5–15)
BUN: 89 mg/dL — ABNORMAL HIGH (ref 6–20)
CO2: 28 mmol/L (ref 22–32)
Calcium: 10 mg/dL (ref 8.9–10.3)
Chloride: 87 mmol/L — ABNORMAL LOW (ref 98–111)
Creatinine, Ser: 6.23 mg/dL — ABNORMAL HIGH (ref 0.61–1.24)
GFR calc Af Amer: 11 mL/min — ABNORMAL LOW (ref >60)
GFR calc non Af Amer: 9 mL/min — ABNORMAL LOW (ref >60)
Glucose, Bld: 229 mg/dL — ABNORMAL HIGH (ref 70–99)
Potassium: 6.1 mmol/L — ABNORMAL HIGH (ref 3.5–5.1)
Sodium: 132 mmol/L — ABNORMAL LOW (ref 135–145)

## 2020-02-03 LAB — CBC
HCT: 34.4 % — ABNORMAL LOW (ref 39.0–52.0)
Hemoglobin: 11.9 g/dL — ABNORMAL LOW (ref 13.0–17.0)
MCH: 27.5 pg (ref 26.0–34.0)
MCHC: 34.6 g/dL (ref 30.0–36.0)
MCV: 79.6 fL — ABNORMAL LOW (ref 80.0–100.0)
Platelets: 187 10*3/uL (ref 150–400)
RBC: 4.32 MIL/uL (ref 4.22–5.81)
RDW: 16.5 % — ABNORMAL HIGH (ref 11.5–15.5)
WBC: 17.2 10*3/uL — ABNORMAL HIGH (ref 4.0–10.5)
nRBC: 0.1 % (ref 0.0–0.2)

## 2020-02-03 LAB — TROPONIN I (HIGH SENSITIVITY): Troponin I (High Sensitivity): 84 ng/L — ABNORMAL HIGH (ref <18)

## 2020-02-03 LAB — HEPATIC FUNCTION PANEL
ALT: 151 U/L — ABNORMAL HIGH (ref 0–44)
AST: 93 U/L — ABNORMAL HIGH (ref 15–41)
Albumin: 2.8 g/dL — ABNORMAL LOW (ref 3.5–5.0)
Alkaline Phosphatase: 271 U/L — ABNORMAL HIGH (ref 38–126)
Bilirubin, Direct: 0.4 mg/dL — ABNORMAL HIGH (ref 0.0–0.2)
Indirect Bilirubin: 0.9 mg/dL (ref 0.3–0.9)
Total Bilirubin: 1.3 mg/dL — ABNORMAL HIGH (ref 0.3–1.2)
Total Protein: 6.5 g/dL (ref 6.5–8.1)

## 2020-02-03 LAB — SARS CORONAVIRUS 2 BY RT PCR (HOSPITAL ORDER, PERFORMED IN ~~LOC~~ HOSPITAL LAB): SARS Coronavirus 2: NEGATIVE

## 2020-02-03 MED ORDER — TENOFOVIR DISOPROXIL FUMARATE 300 MG PO TABS
300.0000 mg | ORAL_TABLET | ORAL | Status: DC
  Administered 2020-02-04: 300 mg via ORAL
  Filled 2020-02-03: qty 1

## 2020-02-03 MED ORDER — SEVELAMER CARBONATE 800 MG PO TABS
2400.0000 mg | ORAL_TABLET | Freq: Three times a day (TID) | ORAL | Status: DC
  Administered 2020-02-04 – 2020-02-05 (×4): 2400 mg via ORAL
  Filled 2020-02-03 (×4): qty 3

## 2020-02-03 MED ORDER — DILTIAZEM LOAD VIA INFUSION
10.0000 mg | Freq: Once | INTRAVENOUS | Status: DC
  Filled 2020-02-03: qty 10

## 2020-02-03 MED ORDER — PREDNISONE 20 MG PO TABS
40.0000 mg | ORAL_TABLET | Freq: Every day | ORAL | Status: DC
  Administered 2020-02-04 – 2020-02-05 (×2): 40 mg via ORAL
  Filled 2020-02-03 (×2): qty 2

## 2020-02-03 MED ORDER — DEXTROSE 50 % IV SOLN
1.0000 | Freq: Once | INTRAVENOUS | Status: AC
  Administered 2020-02-03: 50 mL via INTRAVENOUS
  Filled 2020-02-03: qty 50

## 2020-02-03 MED ORDER — CALCIUM GLUCONATE-NACL 1-0.675 GM/50ML-% IV SOLN
1.0000 g | Freq: Once | INTRAVENOUS | Status: AC
  Administered 2020-02-03: 1000 mg via INTRAVENOUS
  Filled 2020-02-03: qty 50

## 2020-02-03 MED ORDER — DILTIAZEM HCL-DEXTROSE 125-5 MG/125ML-% IV SOLN (PREMIX)
5.0000 mg/h | INTRAVENOUS | Status: DC
  Filled 2020-02-03: qty 125

## 2020-02-03 MED ORDER — SODIUM ZIRCONIUM CYCLOSILICATE 10 G PO PACK
10.0000 g | PACK | Freq: Once | ORAL | Status: AC
  Administered 2020-02-03: 10 g via ORAL
  Filled 2020-02-03: qty 1

## 2020-02-03 MED ORDER — ALBUTEROL SULFATE (2.5 MG/3ML) 0.083% IN NEBU: 3.0000 mL | INHALATION_SOLUTION | RESPIRATORY_TRACT | Status: DC | PRN

## 2020-02-03 MED ORDER — INSULIN ASPART 100 UNIT/ML IV SOLN
5.0000 [IU] | Freq: Once | INTRAVENOUS | Status: AC
  Administered 2020-02-03: 5 [IU] via INTRAVENOUS

## 2020-02-03 MED ORDER — GABAPENTIN 300 MG PO CAPS
300.0000 mg | ORAL_CAPSULE | Freq: Two times a day (BID) | ORAL | Status: DC
  Administered 2020-02-03 – 2020-02-05 (×4): 300 mg via ORAL
  Filled 2020-02-03 (×4): qty 1

## 2020-02-03 NOTE — ED Triage Notes (Signed)
Pt BIB [facility] for eval of SOB, CP. Pt due for dialysis tmrw. Reports last dialysis Friday.

## 2020-02-03 NOTE — ED Provider Notes (Addendum)
[facility] EMERGENCY DEPARTMENT Provider Note   CSN: [id number] Arrival date & time: 02/03/20  2002     History Chief Complaint  Patient presents with  . Chest Pain  . Shortness of Breath    [name] is a 59 y.o. male.  The history is provided by the patient.  Chest Pain Pain location:  Substernal area Pain quality: aching   Pain radiates to:  Does not radiate Pain severity:  Mild Onset quality:  Gradual Timing:  Intermittent Progression:  Waxing and waning Chronicity:  Recurrent Context: at rest   Context comment:  SOB Relieved by:  Nothing Worsened by:  Nothing Associated symptoms: shortness of breath   Associated symptoms: no abdominal pain, no AICD problem, no back pain, no cough, no fever, no lower extremity edema, no palpitations and no vomiting   Risk factors comment:  CKD on HD, afib      Past Medical History:  Diagnosis Date  . Anemia   . Anxiety   . Arthritis    left shoulder  . Atherosclerosis of aorta ([place])   . Cardiomegaly   . Chest pain    DATE UNKNOWN, C/O PERIODICALLY  . Cocaine abuse ([place])   . COPD exacerbation ([place]) 08/17/2016  . Coronary artery disease    stent 02/22/17  . ESRD (end stage renal disease) on dialysis ([place])    "[name]; MWF" (07/04/2017)  . GERD (gastroesophageal reflux disease)    DATE UNKNOWN  . Hemorrhoids   . Hepatitis B, chronic ([place])   . Hepatitis C   . History of kidney stones   . Hyperkalemia   . Hypertension   . Kidney failure   . Metabolic bone disease    Patient denies  . Mitral stenosis   . Myocardial infarction ([place])   . Pneumonia   . Pulmonary edema   . Solitary rectal ulcer syndrome 07/2017   at flex sig for rectal bleeding  . Tubular adenoma of colon     Patient Active Problem List   Diagnosis Date Noted  . Leukocytosis 01/19/2020  . Tobacco use 01/19/2020  . Acute pulmonary edema ([place]) 12/21/2019  . Acetaminophen overdose 10/27/2019  . ESRD (end stage  renal disease) ([place])   . Goals of care, counseling/discussion   . Hypoxia 10/04/2019  . Advanced care planning/counseling discussion   . Renal failure 09/26/2019  . Dyspnea 06/09/2019  . Acquired thrombophilia ([place]) 06/05/2019  . A-fib ([place]) 05/30/2019  . Atrial fibrillation with RVR ([place]) 05/29/2019  . Melena   . Pressure injury of skin 03/09/2019  . Abdominal distention   . Volume overload 12/28/2018  . Sepsis ([place]) 09/12/2018  . Coronary artery disease involving native coronary artery of native heart without angina pectoris 03/11/2018  . Benign neoplasm of cecum   . Benign neoplasm of ascending colon   . Benign neoplasm of descending colon   . Benign neoplasm of rectum   . AF (paroxysmal atrial fibrillation) ([place]) 01/23/2018  . Hx of colonic polyps 01/20/2018  . End-stage renal disease on hemodialysis ([place]) 11/21/2017  . GERD (gastroesophageal reflux disease) 11/16/2017  . Decompensated hepatic cirrhosis ([place]) 11/15/2017  . Palliative care by specialist   . Hyponatremia 11/04/2017  . SBP (spontaneous bacterial peritonitis) ([place]) 10/30/2017  . Liver disease, chronic 10/30/2017  . SOB (shortness of breath)   . Abdominal pain 10/28/2017  . Upper airway cough syndrome with flattening on f/v loop 10/13/17 c/w vcd 10/17/2017  . Elevated diaphragm 10/13/2017  .  Ileus ([place]) 09/29/2017  . QT prolongation 09/29/2017  . Malnutrition of moderate degree 09/29/2017  . Sinus congestion 09/03/2017  . Symptomatic anemia 09/02/2017  . Cirrhosis of liver with ascites ([place]) 09/02/2017  . Left bundle branch block 09/02/2017  . Mitral stenosis 09/02/2017  . Hematochezia 07/15/2017  . Wide-complex tachycardia ([place])   . Endotracheally intubated   . ESRD on dialysis ([employer]) 07/04/2017  . Acute respiratory failure with hypoxia ([place]) 06/18/2017  . CKD (chronic kidney disease) stage V requiring chronic dialysis ([place]) 06/18/2017  . History of Cocaine abuse ([place]) 06/18/2017  . Hypertension 06/18/2017   . Infection of AV graft for dialysis ([place]) 06/18/2017  . Anxiety 06/18/2017  . Anemia due to chronic kidney disease 06/18/2017  . Atypical atrial flutter ([place]) 06/18/2017  . Personality disorder ([place]) 06/13/2017  . Cellulitis 06/12/2017  . Adjustment disorder with mixed anxiety and depressed mood 06/10/2017  . Suicidal ideation 06/10/2017  . Arm wound, left, sequela 06/10/2017  . Dyspnea on exertion 05/29/2017  . Tachycardia 05/29/2017  . Hyperkalemia 05/22/2017  . Acute metabolic encephalopathy   . Anemia 04/23/2017  . Ascites 04/23/2017  . COPD (chronic obstructive pulmonary disease) ([place]) 04/23/2017  . Acute on chronic respiratory failure with hypoxia ([place]) 03/25/2017  . Arrhythmia 03/25/2017  . COPD GOLD 0 with flattening on inps f/v  09/27/2016  . Essential hypertension 09/27/2016  . Fluid overload 08/30/2016  . COPD exacerbation ([place]) 08/17/2016  . Hypertensive urgency 08/17/2016  . Problem with dialysis access ([place]) 07/23/2016  . Chronic hepatitis B ([place]) 03/05/2014  . Chronic hepatitis C without hepatic coma ([place]) 03/05/2014  . Internal hemorrhoids with bleeding, swelling and itching 03/05/2014  . Thrombocytopenia ([place]) 03/05/2014  . Chest pain 02/27/2014  . Alcohol abuse 04/14/2009  . Nicotine dependence, cigarettes, uncomplicated 16/04/9603  . GANGLION CYST 04/14/2009    Past Surgical History:  Procedure Laterality Date  . A/V FISTULAGRAM Left 05/26/2017   Procedure: A/V FISTULAGRAM;  Surgeon: Conrad New England, MD;  Location: [place] [facility];  Service: Cardiovascular;  Laterality: Left;  . A/V FISTULAGRAM Right 11/18/2017   Procedure: A/V FISTULAGRAM - Right Arm;  Surgeon: Elam Dutch, MD;  Location: [place] [facility];  Service: Cardiovascular;  Laterality: Right;  . APPLICATION OF WOUND VAC Left 06/14/2017   Procedure: APPLICATION OF WOUND VAC;  Surgeon: Katha Cabal, MD;  Location: [facility];  Service: Vascular;  Laterality: Left;  . AV FISTULA  PLACEMENT  2012   BELIEVED WAS PLACED IN JUNE  . AV FISTULA PLACEMENT Right 08/09/2017   Procedure: Creation Right arm ARTERIOVENOUS BRACHIOCEPOHALIC FISTULA;  Surgeon: Elam Dutch, MD;  Location: [facility];  Service: Vascular;  Laterality: Right;  . AV FISTULA PLACEMENT Right 11/22/2017   Procedure: INSERTION OF ARTERIOVENOUS (AV) GORE-TEX GRAFT RIGHT UPPER ARM;  Surgeon: Elam Dutch, MD;  Location: [place];  Service: Vascular;  Laterality: Right;  . BIOPSY  01/25/2018   Procedure: BIOPSY;  Surgeon: Jerene Bears, MD;  Location: [place];  Service: Gastroenterology;;  . BIOPSY  04/10/2019   Procedure: BIOPSY;  Surgeon: Jerene Bears, MD;  Location: [facility];  Service: Gastroenterology;;  . COLONOSCOPY    . COLONOSCOPY WITH PROPOFOL N/A 01/25/2018   Procedure: COLONOSCOPY WITH PROPOFOL;  Surgeon: Jerene Bears, MD;  Location: [place];  Service: Gastroenterology;  Laterality: N/A;  . CORONARY STENT INTERVENTION N/A 02/22/2017   Procedure: CORONARY STENT INTERVENTION;  Surgeon: Nigel Mormon, MD;  Location: [place] [facility];  Service: Cardiovascular;  Laterality: N/A;  . ESOPHAGOGASTRODUODENOSCOPY (EGD) WITH PROPOFOL N/A 01/25/2018   Procedure: ESOPHAGOGASTRODUODENOSCOPY (EGD) WITH PROPOFOL;  Surgeon: Jerene Bears, MD;  Location: [place];  Service: Gastroenterology;  Laterality: N/A;  . ESOPHAGOGASTRODUODENOSCOPY (EGD) WITH PROPOFOL N/A 04/10/2019   Procedure: ESOPHAGOGASTRODUODENOSCOPY (EGD) WITH PROPOFOL;  Surgeon: Jerene Bears, MD;  Location: [facility];  Service: Gastroenterology;  Laterality: N/A;  . FLEXIBLE SIGMOIDOSCOPY N/A 07/15/2017   Procedure: FLEXIBLE SIGMOIDOSCOPY;  Surgeon: Carol Ada, MD;  Location: [place];  Service: Endoscopy;  Laterality: N/A;  . HEMORRHOID BANDING    . I & D EXTREMITY Left 06/01/2017   Procedure: IRRIGATION AND DEBRIDEMENT LEFT ARM HEMATOMA WITH LIGATION OF LEFT ARM AV FISTULA;  Surgeon: Elam Dutch, MD;  Location: [facility];  Service: Vascular;  Laterality: Left;  . I & D EXTREMITY Left 06/14/2017   Procedure: IRRIGATION AND DEBRIDEMENT EXTREMITY;  Surgeon: Katha Cabal, MD;  Location: [facility];  Service: Vascular;  Laterality: Left;  . INSERTION OF DIALYSIS CATHETER  05/30/2017  . INSERTION OF DIALYSIS CATHETER N/A 05/30/2017   Procedure: INSERTION OF DIALYSIS CATHETER;  Surgeon: Elam Dutch, MD;  Location: [place];  Service: Vascular;  Laterality: N/A;  . IR PARACENTESIS  08/30/2017  . IR PARACENTESIS  09/29/2017  . IR PARACENTESIS  10/28/2017  . IR PARACENTESIS  11/09/2017  . IR PARACENTESIS  11/16/2017  . IR PARACENTESIS  11/28/2017  . IR PARACENTESIS  12/01/2017  . IR PARACENTESIS  12/06/2017  . IR PARACENTESIS  01/03/2018  . IR PARACENTESIS  01/23/2018  . IR PARACENTESIS  02/07/2018  . IR PARACENTESIS  02/21/2018  . IR PARACENTESIS  03/06/2018  . IR PARACENTESIS  03/17/2018  . IR PARACENTESIS  04/04/2018  . IR PARACENTESIS  12/28/2018  . IR PARACENTESIS  01/08/2019  . IR PARACENTESIS  01/23/2019  . IR PARACENTESIS  02/01/2019  . IR PARACENTESIS  02/19/2019  . IR PARACENTESIS  03/01/2019  . IR PARACENTESIS  03/15/2019  . IR PARACENTESIS  04/03/2019  . IR PARACENTESIS  04/12/2019  . IR PARACENTESIS  05/01/2019  . IR PARACENTESIS  05/08/2019  . IR PARACENTESIS  05/24/2019  . IR PARACENTESIS  06/12/2019  . IR PARACENTESIS  07/09/2019  . IR PARACENTESIS  07/27/2019  . IR PARACENTESIS  08/09/2019  . IR PARACENTESIS  08/21/2019  . IR PARACENTESIS  09/17/2019  . IR PARACENTESIS  10/05/2019  . IR PARACENTESIS  10/29/2019  . IR PARACENTESIS  11/08/2019  . IR PARACENTESIS  12/12/2019  . IR PARACENTESIS  01/03/2020  . IR PARACENTESIS  01/10/2020  . IR PARACENTESIS  01/17/2020  . IR PARACENTESIS  01/24/2020  . IR PARACENTESIS  01/31/2020  . IR RADIOLOGIST EVAL & MGMT  02/14/2018  . IR RADIOLOGIST EVAL & MGMT  02/22/2019  . LEFT HEART CATH AND CORONARY ANGIOGRAPHY N/A 02/22/2017   Procedure: LEFT HEART CATH AND CORONARY  ANGIOGRAPHY;  Surgeon: Nigel Mormon, MD;  Location: [place] [facility];  Service: Cardiovascular;  Laterality: N/A;  . LIGATION OF ARTERIOVENOUS  FISTULA Left 02/12/4195   Procedure: Plication of Left Arm Arteriovenous Fistula;  Surgeon: Elam Dutch, MD;  Location: [place];  Service: Vascular;  Laterality: Left;  . POLYPECTOMY    . POLYPECTOMY  01/25/2018   Procedure: POLYPECTOMY;  Surgeon: Jerene Bears, MD;  Location: [place];  Service: Gastroenterology;;  . REVISON OF ARTERIOVENOUS FISTULA Left 09/02/9796   Procedure: PLICATION OF DISTAL ANEURYSMAL SEGEMENT OF LEFT UPPER ARM ARTERIOVENOUS  FISTULA;  Surgeon: Elam Dutch, MD;  Location: [place];  Service: Vascular;  Laterality: Left;  . REVISON OF ARTERIOVENOUS FISTULA Left 6/38/7564   Procedure: Plication of Left Upper Arm Fistula ;  Surgeon: Waynetta Sandy, MD;  Location: [facility];  Service: Vascular;  Laterality: Left;  . SKIN GRAFT SPLIT THICKNESS LEG / FOOT Left    SKIN GRAFT SPLIT THICKNESS LEFT ARM DONOR SITE: LEFT ANTERIOR THIGH  . SKIN SPLIT GRAFT Left 07/04/2017   Procedure: SKIN GRAFT SPLIT THICKNESS LEFT ARM DONOR SITE: LEFT ANTERIOR THIGH;  Surgeon: Elam Dutch, MD;  Location: [place] [facility];  Service: Vascular;  Laterality: Left;  . THROMBECTOMY W/ EMBOLECTOMY Left 06/05/2017   Procedure: EXPLORATION OF LEFT ARM FOR BLEEDING; OVERSEWED PROXIMAL FISTULA;  Surgeon: Angelia Mould, MD;  Location: [place];  Service: Vascular;  Laterality: Left;  . WOUND EXPLORATION Left 06/03/2017   Procedure: WOUND EXPLORATION WITH WOUND VAC APPLICATION TO LEFT ARM;  Surgeon: Angelia Mould, MD;  Location: [facility] OR;  Service: Vascular;  Laterality: Left;       Family History  Problem Relation Age of Onset  . Heart disease Mother   . Lung cancer Mother   . Heart disease Father   . Malignant hyperthermia Father   . COPD Father   . Throat cancer Sister   . Esophageal cancer Sister   . Hypertension Other   . COPD  Other   . Colon cancer Neg Hx   . Colon polyps Neg Hx   . Rectal cancer Neg Hx   . Stomach cancer Neg Hx     Social History   Tobacco Use  . Smoking status: Current Every Day Smoker    Packs/day: 0.50    Years: 43.00    Pack years: 21.50    Types: Cigarettes    Start date: 08/13/1973  . Smokeless tobacco: Never Used  . Tobacco comment: i dont know i just make   Vaping Use  . Vaping Use: Never used  Substance Use Topics  . Alcohol use: Not Currently    Comment: quit drinking in 2017  . Drug use: Not Currently    Types: Marijuana, Cocaine    Comment: reports using once every 3 months, 04-06-2019 was this     Home Medications Prior to Admission medications   Medication Sig Start Date End Date Taking? Authorizing Provider  albuterol (VENTOLIN HFA) 108 (90 Base) MCG/ACT inhaler Inhale 2 puffs into the lungs every 4 (four) hours as needed for wheezing or shortness of breath.  02/03/20  Yes [provider]  amiodarone (PACERONE) 200 MG tablet Take 1 tablet (200 mg total) by mouth daily. 01/29/20  Yes Shelly Coss, MD  diltiazem (CARDIZEM CD) 240 MG 24 hr capsule Take 240 mg by mouth daily. 01/29/20  Yes [provider]  diphenhydrAMINE (BENADRYL) 25 mg capsule Take 1 capsule (25 mg total) by mouth every 6 (six) hours as needed for itching or allergies. 01/29/20  Yes Shelly Coss, MD  gabapentin (NEURONTIN) 300 MG capsule Take 1 capsule (300 mg total) by mouth 2 (two) times daily. 01/23/20  Yes Adrian Prows, MD  metoprolol tartrate (LOPRESSOR) 25 MG tablet Take 0.5 tablets (12.5 mg total) by mouth 2 (two) times daily. Patient taking differently: Take 25 mg by mouth 2 (two) times daily.  01/29/20  Yes Shelly Coss, MD  Oxycodone HCl 10 MG TABS Take 10 mg by mouth 3 (three) times daily as needed (pain).  01/24/20  Yes [provider]  predniSONE (DELTASONE) 20 MG tablet Take 2 tablets (40 mg total) by mouth daily with breakfast for 4 days. 01/30/20 02/03/20 Yes  Shelly Coss, MD  sevelamer carbonate (RENVELA) 800 MG tablet Take 2,400 mg by mouth See admin instructions. Take 3 tablets (2400 mg) by mouth up to three times daily with meals   Yes [provider]  tenofovir (VIREAD) 300 MG tablet Take 300 mg by mouth every Monday.  01/03/20  Yes [provider]    Allergies    Tramadol, Morphine and related, Pollen extract, Acetaminophen, Aspirin, and Clonidine derivatives  Review of Systems   Review of Systems  Constitutional: Negative for chills and fever.  HENT: Negative for ear pain and sore throat.   Eyes: Negative for pain and visual disturbance.  Respiratory: Positive for shortness of breath. Negative for cough.   Cardiovascular: Positive for chest pain. Negative for palpitations.  Gastrointestinal: Negative for abdominal pain and vomiting.  Genitourinary: Negative for dysuria and hematuria.  Musculoskeletal: Negative for arthralgias and back pain.  Skin: Negative for color change and rash.  Neurological: Negative for seizures and syncope.  All other systems reviewed and are negative.   Physical Exam Updated Vital Signs  ED Triage Vitals  Enc Vitals Group     BP 02/03/20 2015 (!) 79/53     Pulse Rate 02/03/20 2015 100     Resp 02/03/20 2015 20     Temp 02/03/20 2015 98.8 F (37.1 C)     Temp Source 02/03/20 2015 Oral     SpO2 02/03/20 2000 96 %     Weight 02/03/20 2012 154 lb 5.2 oz (70 kg)     Height 02/03/20 2012 5\' 9"  (1.753 m)     Head Circumference --      Peak Flow --      Pain Score 02/03/20 2012 6     Pain Loc --      Pain Edu? --      Excl. in [place]? --     Physical Exam Vitals and nursing note reviewed.  Constitutional:      General: He is not in acute distress.    Appearance: He is well-developed. He is not ill-appearing.  HENT:     Head: Normocephalic and atraumatic.  Eyes:     Extraocular Movements: Extraocular movements intact.     Conjunctiva/sclera: Conjunctivae normal.     Pupils:  Pupils are equal, round, and reactive to light.  Cardiovascular:     Rate and Rhythm: Normal rate and regular rhythm.     Pulses:          Radial pulses are 2+ on the right side and 2+ on the left side.     Heart sounds: Normal heart sounds. No murmur heard.   Pulmonary:     Effort: Pulmonary effort is normal. No respiratory distress.     Breath sounds: Decreased breath sounds and rhonchi present.  Abdominal:     Palpations: Abdomen is soft.     Tenderness: There is no abdominal tenderness.  Musculoskeletal:        General: Normal range of motion.     Cervical back: Normal range of motion and neck supple.     Right lower leg: No edema.     Left lower leg: No edema.  Skin:    General: Skin is warm and dry.     Capillary Refill: Capillary refill takes less than 2 seconds.  Neurological:     General:  No focal deficit present.     Mental Status: He is alert.     ED Results / Procedures / Treatments   Labs (all labs ordered are listed, but only abnormal results are displayed) Labs Reviewed  BASIC METABOLIC PANEL - Abnormal; Notable for the following components:      Result Value   Sodium 132 (*)    Potassium 6.1 (*)    Chloride 87 (*)    Glucose, Bld 229 (*)    BUN 89 (*)    Creatinine, Ser 6.23 (*)    GFR calc non Af Amer 9 (*)    GFR calc Af Amer 11 (*)    Anion gap 17 (*)    All other components within normal limits  CBC - Abnormal; Notable for the following components:   WBC 17.2 (*)    Hemoglobin 11.9 (*)    HCT 34.4 (*)    MCV 79.6 (*)    RDW 16.5 (*)    All other components within normal limits  HEPATIC FUNCTION PANEL - Abnormal; Notable for the following components:   Albumin 2.8 (*)    AST 93 (*)    ALT 151 (*)    Alkaline Phosphatase 271 (*)    Total Bilirubin 1.3 (*)    Bilirubin, Direct 0.4 (*)    All other components within normal limits  TROPONIN I (HIGH SENSITIVITY) - Abnormal; Notable for the following components:   Troponin I (High Sensitivity)  84 (*)    All other components within normal limits  SARS CORONAVIRUS 2 BY RT PCR (HOSPITAL ORDER, [facility])  TROPONIN I (HIGH SENSITIVITY)    EKG EKG Interpretation  Date/Time:  Sunday February 03 2020 20:13:06 EDT Ventricular Rate:  116 PR Interval:    QRS Duration: 170 QT Interval:  350 QTC Calculation: 486 R Axis:   -79 Text Interpretation: Atrial fibrillation with rapid ventricular response Left axis deviation Left bundle branch block Abnormal ECG Confirmed by Lennice Sites [phone]) on 02/03/2020 8:27:48 PM   Radiology DG Chest 2 View  Result Date: 02/03/2020 CLINICAL DATA:  59 year old male with chest pain and shortness of breath. EXAM: CHEST - 2 VIEW COMPARISON:  Chest radiograph dated 01/25/2020. FINDINGS: There is mild eventration of the left hemidiaphragm with left lung base atelectasis. There is no focal consolidation, pleural effusion, or pneumothorax. Stable cardiomegaly with probable mild vascular congestion. No edema. Atherosclerotic calcification of the aorta. No acute osseous pathology. IMPRESSION: Cardiomegaly with probable mild vascular congestion. No focal consolidation. Electronically Signed   By: Anner Crete M.D.   On: 02/03/2020 20:37    Procedures .Critical Care Performed by: Lennice Sites, DO Authorized by: Lennice Sites, DO   Critical care provider statement:    Critical care time (minutes):  45   Critical care was necessary to treat or prevent imminent or life-threatening deterioration of the following conditions:  Circulatory failure   Critical care was time spent personally by me on the following activities:  Development of treatment plan with patient or surrogate, blood draw for specimens, discussions with consultants, discussions with primary provider, evaluation of patient's response to treatment, examination of patient, obtaining history from patient or surrogate, ordering and performing treatments and interventions,  ordering and review of laboratory studies, ordering and review of radiographic studies, pulse oximetry, re-evaluation of patient's condition and review of old charts   I assumed direction of critical care for this patient from another provider in my specialty: no     (  including critical care time)  Medications Ordered in ED Medications  diltiazem (CARDIZEM) 1 mg/mL load via infusion 10 mg (has no administration in time range)    And  diltiazem (CARDIZEM) 125 mg in dextrose 5% 125 mL (1 mg/mL) infusion (has no administration in time range)  calcium gluconate 1 g/ 50 mL sodium chloride IVPB (1,000 mg Intravenous New Bag/Given 02/03/20 2122)  sodium zirconium cyclosilicate (LOKELMA) packet 10 g (10 g Oral Given 02/03/20 2123)  insulin aspart (novoLOG) injection 5 Units (5 Units Intravenous Given 02/03/20 2117)    And  dextrose 50 % solution 50 mL (50 mLs Intravenous Given 02/03/20 2119)    ED Course  I have reviewed the triage vital signs and the nursing notes.  Pertinent labs & imaging results that were available during my care of the patient were reviewed by me and considered in my medical decision making (see chart for details).    MDM Rules/Calculators/A&P                          Gatlin Kittell is a 59 year old male with history of A. fib, cirrhosis, CKD on hemodialysis who presents to the ED with shortness of breath, chest pain.  Symptoms worse today.  History of the same.  Blood pressure on arrival in the 50K systolic.  However on repeat patient appears to be more consistently in the 90s and low 100s.  EKG shows A. fib with RVR.  Heart rate between 100-140.  Does not feel palpitations.  Patient recently admitted for the same.  Just started diltiazem.  Is on amiodarone and metoprolol as well.  Has been on steroids recently.  Patient states that he went to dialysis on Friday.  EKG did show some peaked T waves.  Was given IV calcium, dextrose and insulin.  Nephrology has been consulted  as potassium came back at 6.1.  Chest x-ray shows some volume overload.  Given A. fib with RVR and some soft blood pressures dialysis likely in the morning.  Will consult cardiology for medicine recommendations.  He does not appear to be a good candidate for cardioversion.  Now that blood pressure seems to be more stable in the 100s believe that he will tolerate diltiazem.  Otherwise lab work is unremarkable.  To be admitted to medicine for further care.  Covid testing has been sent.  This chart was dictated using voice recognition software.  Despite best efforts to proofread,  errors can occur which can change the documentation meaning.    Final Clinical Impression(s) / ED Diagnoses Final diagnoses:  Hyperkalemia  Elevated troponin  Hypervolemia, unspecified hypervolemia type  Atrial fibrillation with RVR [facility])    Rx / DC Orders ED Discharge Orders    None       Lennice Sites, DO 02/03/20 [place], [place], DO 02/03/20 2324

## 2020-02-04 ENCOUNTER — Other Ambulatory Visit: Payer: Self-pay

## 2020-02-04 DIAGNOSIS — M19012 Primary osteoarthritis, left shoulder: Secondary | ICD-10-CM | POA: Diagnosis present

## 2020-02-04 DIAGNOSIS — N186 End stage renal disease: Secondary | ICD-10-CM

## 2020-02-04 DIAGNOSIS — Z955 Presence of coronary angioplasty implant and graft: Secondary | ICD-10-CM | POA: Diagnosis not present

## 2020-02-04 DIAGNOSIS — B181 Chronic viral hepatitis B without delta-agent: Secondary | ICD-10-CM | POA: Diagnosis present

## 2020-02-04 DIAGNOSIS — I4819 Other persistent atrial fibrillation: Secondary | ICD-10-CM | POA: Diagnosis present

## 2020-02-04 DIAGNOSIS — E875 Hyperkalemia: Secondary | ICD-10-CM

## 2020-02-04 DIAGNOSIS — Z87442 Personal history of urinary calculi: Secondary | ICD-10-CM | POA: Diagnosis not present

## 2020-02-04 DIAGNOSIS — I4891 Unspecified atrial fibrillation: Secondary | ICD-10-CM

## 2020-02-04 DIAGNOSIS — J432 Centrilobular emphysema: Secondary | ICD-10-CM | POA: Diagnosis not present

## 2020-02-04 DIAGNOSIS — I43 Cardiomyopathy in diseases classified elsewhere: Secondary | ICD-10-CM | POA: Diagnosis present

## 2020-02-04 DIAGNOSIS — E8770 Fluid overload, unspecified: Secondary | ICD-10-CM | POA: Diagnosis present

## 2020-02-04 DIAGNOSIS — R188 Other ascites: Secondary | ICD-10-CM | POA: Diagnosis present

## 2020-02-04 DIAGNOSIS — Z885 Allergy status to narcotic agent status: Secondary | ICD-10-CM | POA: Diagnosis not present

## 2020-02-04 DIAGNOSIS — J9601 Acute respiratory failure with hypoxia: Secondary | ICD-10-CM | POA: Diagnosis not present

## 2020-02-04 DIAGNOSIS — Z992 Dependence on renal dialysis: Secondary | ICD-10-CM | POA: Diagnosis not present

## 2020-02-04 DIAGNOSIS — Z20822 Contact with and (suspected) exposure to covid-19: Secondary | ICD-10-CM | POA: Diagnosis present

## 2020-02-04 DIAGNOSIS — J449 Chronic obstructive pulmonary disease, unspecified: Secondary | ICD-10-CM

## 2020-02-04 DIAGNOSIS — I2510 Atherosclerotic heart disease of native coronary artery without angina pectoris: Secondary | ICD-10-CM | POA: Diagnosis present

## 2020-02-04 DIAGNOSIS — I1311 Hypertensive heart and chronic kidney disease without heart failure, with stage 5 chronic kidney disease, or end stage renal disease: Secondary | ICD-10-CM | POA: Diagnosis present

## 2020-02-04 DIAGNOSIS — Z886 Allergy status to analgesic agent status: Secondary | ICD-10-CM | POA: Diagnosis not present

## 2020-02-04 DIAGNOSIS — I252 Old myocardial infarction: Secondary | ICD-10-CM | POA: Diagnosis not present

## 2020-02-04 DIAGNOSIS — N2581 Secondary hyperparathyroidism of renal origin: Secondary | ICD-10-CM | POA: Diagnosis present

## 2020-02-04 DIAGNOSIS — K219 Gastro-esophageal reflux disease without esophagitis: Secondary | ICD-10-CM | POA: Diagnosis present

## 2020-02-04 DIAGNOSIS — I313 Pericardial effusion (noninflammatory): Secondary | ICD-10-CM | POA: Diagnosis present

## 2020-02-04 DIAGNOSIS — G894 Chronic pain syndrome: Secondary | ICD-10-CM | POA: Diagnosis present

## 2020-02-04 DIAGNOSIS — Z5329 Procedure and treatment not carried out because of patient's decision for other reasons: Secondary | ICD-10-CM | POA: Diagnosis present

## 2020-02-04 DIAGNOSIS — K7460 Unspecified cirrhosis of liver: Secondary | ICD-10-CM | POA: Diagnosis present

## 2020-02-04 LAB — BASIC METABOLIC PANEL
Anion gap: 18 — ABNORMAL HIGH (ref 5–15)
BUN: 94 mg/dL — ABNORMAL HIGH (ref 6–20)
CO2: 29 mmol/L (ref 22–32)
Calcium: 10.2 mg/dL (ref 8.9–10.3)
Chloride: 87 mmol/L — ABNORMAL LOW (ref 98–111)
Creatinine, Ser: 6.63 mg/dL — ABNORMAL HIGH (ref 0.61–1.24)
GFR calc Af Amer: 10 mL/min — ABNORMAL LOW (ref >60)
GFR calc non Af Amer: 8 mL/min — ABNORMAL LOW (ref >60)
Glucose, Bld: 113 mg/dL — ABNORMAL HIGH (ref 70–99)
Potassium: 6.1 mmol/L — ABNORMAL HIGH (ref 3.5–5.1)
Sodium: 134 mmol/L — ABNORMAL LOW (ref 135–145)

## 2020-02-04 LAB — CBC
HCT: 33.3 % — ABNORMAL LOW (ref 39.0–52.0)
Hemoglobin: 11.8 g/dL — ABNORMAL LOW (ref 13.0–17.0)
MCH: 28.2 pg (ref 26.0–34.0)
MCHC: 35.4 g/dL (ref 30.0–36.0)
MCV: 79.5 fL — ABNORMAL LOW (ref 80.0–100.0)
Platelets: 185 10*3/uL (ref 150–400)
RBC: 4.19 MIL/uL — ABNORMAL LOW (ref 4.22–5.81)
RDW: 16.4 % — ABNORMAL HIGH (ref 11.5–15.5)
WBC: 18.6 10*3/uL — ABNORMAL HIGH (ref 4.0–10.5)
nRBC: 0.2 % (ref 0.0–0.2)

## 2020-02-04 LAB — TROPONIN I (HIGH SENSITIVITY): Troponin I (High Sensitivity): 93 ng/L — ABNORMAL HIGH (ref <18)

## 2020-02-04 MED ORDER — LEVALBUTEROL HCL 0.63 MG/3ML IN NEBU: 0.6300 mg | INHALATION_SOLUTION | Freq: Three times a day (TID) | RESPIRATORY_TRACT | Status: DC | PRN

## 2020-02-04 MED ORDER — ACETAMINOPHEN 325 MG PO TABS
650.0000 mg | ORAL_TABLET | ORAL | Status: DC | PRN
  Administered 2020-02-05: 650 mg via ORAL
  Filled 2020-02-04: qty 2

## 2020-02-04 MED ORDER — MOMETASONE FURO-FORMOTEROL FUM 100-5 MCG/ACT IN AERO
2.0000 | INHALATION_SPRAY | Freq: Two times a day (BID) | RESPIRATORY_TRACT | Status: DC
  Administered 2020-02-05: 2 via RESPIRATORY_TRACT
  Filled 2020-02-04 (×2): qty 8.8

## 2020-02-04 MED ORDER — DILTIAZEM HCL-DEXTROSE 125-5 MG/125ML-% IV SOLN (PREMIX)
5.0000 mg/h | INTRAVENOUS | Status: DC
  Administered 2020-02-04: 10 mg/h via INTRAVENOUS
  Administered 2020-02-04: 5 mg/h via INTRAVENOUS
  Administered 2020-02-05: 10 mg/h via INTRAVENOUS
  Filled 2020-02-04 (×3): qty 125

## 2020-02-04 MED ORDER — AMIODARONE HCL 200 MG PO TABS
200.0000 mg | ORAL_TABLET | Freq: Every day | ORAL | Status: DC
  Administered 2020-02-05 (×2): 200 mg via ORAL
  Filled 2020-02-04 (×2): qty 1

## 2020-02-04 MED ORDER — ONDANSETRON HCL 4 MG/2ML IJ SOLN: 4.0000 mg | Freq: Four times a day (QID) | INTRAMUSCULAR | Status: DC | PRN

## 2020-02-04 MED ORDER — CHLORHEXIDINE GLUCONATE CLOTH 2 % EX PADS
6.0000 | MEDICATED_PAD | Freq: Every day | CUTANEOUS | Status: DC
  Administered 2020-02-05: 6 via TOPICAL

## 2020-02-04 MED ORDER — HYDROMORPHONE HCL 1 MG/ML IJ SOLN
0.5000 mg | INTRAMUSCULAR | Status: DC | PRN
  Administered 2020-02-04 – 2020-02-05 (×3): 0.5 mg via INTRAVENOUS
  Filled 2020-02-04 (×3): qty 0.5

## 2020-02-04 MED ORDER — OXYCODONE HCL 5 MG PO TABS
10.0000 mg | ORAL_TABLET | Freq: Three times a day (TID) | ORAL | Status: DC | PRN
  Administered 2020-02-04 (×2): 10 mg via ORAL
  Filled 2020-02-04 (×4): qty 2

## 2020-02-04 MED ORDER — AMIODARONE HCL 200 MG PO TABS: 200.0000 mg | ORAL_TABLET | Freq: Every day | ORAL | Status: DC

## 2020-02-04 MED ORDER — HEPARIN SODIUM (PORCINE) 5000 UNIT/ML IJ SOLN
5000.0000 [IU] | Freq: Three times a day (TID) | INTRAMUSCULAR | Status: DC
  Administered 2020-02-04: 5000 [IU] via SUBCUTANEOUS
  Filled 2020-02-04 (×2): qty 1

## 2020-02-04 NOTE — Plan of Care (Signed)
Initiated care plan  Problem: Education: Goal: Knowledge of General Education information will improve Description: Including pain rating scale, medication(s)/side effects and non-pharmacologic comfort measures 02/04/2020 1911 by Shanon Ace, RN Outcome: Progressing 02/04/2020 1911 by Shanon Ace, RN Outcome: Progressing   Problem: Health Behavior/Discharge Planning: Goal: Ability to manage health-related needs will improve 02/04/2020 1911 by Shanon Ace, RN Outcome: Progressing 02/04/2020 1911 by Shanon Ace, RN Outcome: Progressing   Problem: Clinical Measurements: Goal: Ability to maintain clinical measurements within normal limits will improve 02/04/2020 1911 by Shanon Ace, RN Outcome: Progressing 02/04/2020 1911 by Shanon Ace, RN Outcome: Progressing Goal: Will remain free from infection 02/04/2020 1911 by Shanon Ace, RN Outcome: Progressing 02/04/2020 1911 by Shanon Ace, RN Outcome: Progressing Goal: Diagnostic test results will improve 02/04/2020 1911 by Shanon Ace, RN Outcome: Progressing 02/04/2020 1911 by Shanon Ace, RN Outcome: Progressing Goal: Respiratory complications will improve 02/04/2020 1911 by Shanon Ace, RN Outcome: Progressing 02/04/2020 1911 by Shanon Ace, RN Outcome: Progressing Goal: Cardiovascular complication will be avoided 02/04/2020 1911 by Shanon Ace, RN Outcome: Progressing 02/04/2020 1911 by Shanon Ace, RN Outcome: Progressing   Problem: Activity: Goal: Risk for activity intolerance will decrease 02/04/2020 1911 by Shanon Ace, RN Outcome: Progressing 02/04/2020 1911 by Shanon Ace, RN Outcome: Progressing   Problem: Nutrition: Goal: Adequate nutrition will be maintained 02/04/2020 1911 by Shanon Ace, RN Outcome: Progressing 02/04/2020 1911 by Shanon Ace, RN Outcome: Progressing   Problem: Coping: Goal: Level of anxiety will decrease 02/04/2020 1911 by Shanon Ace, RN Outcome:  Progressing 02/04/2020 1911 by Shanon Ace, RN Outcome: Progressing   Problem: Elimination: Goal: Will not experience complications related to bowel motility 02/04/2020 1911 by Shanon Ace, RN Outcome: Progressing 02/04/2020 1911 by Shanon Ace, RN Outcome: Progressing Goal: Will not experience complications related to urinary retention 02/04/2020 1911 by Shanon Ace, RN Outcome: Progressing 02/04/2020 1911 by Shanon Ace, RN Outcome: Progressing   Problem: Pain Managment: Goal: General experience of comfort will improve 02/04/2020 1911 by Shanon Ace, RN Outcome: Progressing 02/04/2020 1911 by Shanon Ace, RN Outcome: Progressing   Problem: Safety: Goal: Ability to remain free from injury will improve 02/04/2020 1911 by Shanon Ace, RN Outcome: Progressing 02/04/2020 1911 by Shanon Ace, RN Outcome: Progressing   Problem: Skin Integrity: Goal: Risk for impaired skin integrity will decrease 02/04/2020 1911 by Shanon Ace, RN Outcome: Progressing 02/04/2020 1911 by Shanon Ace, RN Outcome: Progressing   Problem: Education: Goal: Knowledge of disease and its progression will improve 02/04/2020 1911 by Shanon Ace, RN Outcome: Progressing 02/04/2020 1911 by Shanon Ace, RN Outcome: Progressing Goal: Individualized Educational Video(s) 02/04/2020 1911 by Shanon Ace, RN Outcome: Progressing 02/04/2020 1911 by Shanon Ace, RN Outcome: Progressing   Problem: Fluid Volume: Goal: Compliance with measures to maintain balanced fluid volume will improve 02/04/2020 1911 by Shanon Ace, RN Outcome: Progressing 02/04/2020 1911 by Shanon Ace, RN Outcome: Progressing   Problem: Health Behavior/Discharge Planning: Goal: Ability to manage health-related needs will improve 02/04/2020 1911 by Shanon Ace, RN Outcome: Progressing 02/04/2020 1911 by Shanon Ace, RN Outcome: Progressing   Problem: Nutritional: Goal: Ability to make healthy dietary choices  will improve 02/04/2020 1911 by Shanon Ace, RN Outcome: Progressing 02/04/2020 1911 by Shanon Ace, RN Outcome: Progressing   Problem: Clinical Measurements: Goal: Complications related to the disease process, condition or treatment will be avoided or minimized 02/04/2020 1911 by Shanon Ace, RN Outcome: Progressing 02/04/2020 1911 by Shanon Ace, RN Outcome: Progressing   Problem: Education: Goal: Ability to state activities that reduce stress will  improve 02/04/2020 1911 by Shanon Ace, RN Outcome: Progressing 02/04/2020 1911 by Shanon Ace, RN Outcome: Progressing   Problem: Coping: Goal: Ability to identify and develop effective coping behavior will improve 02/04/2020 1911 by Shanon Ace, RN Outcome: Progressing 02/04/2020 1911 by Shanon Ace, RN Outcome: Progressing   Problem: Self-Concept: Goal: Ability to identify factors that promote anxiety will improve 02/04/2020 1911 by Shanon Ace, RN Outcome: Progressing 02/04/2020 1911 by Shanon Ace, RN Outcome: Progressing Goal: Level of anxiety will decrease 02/04/2020 1911 by Shanon Ace, RN Outcome: Progressing 02/04/2020 1911 by Shanon Ace, RN Outcome: Progressing Goal: Ability to modify response to factors that promote anxiety will improve 02/04/2020 1911 by Shanon Ace, RN Outcome: Progressing 02/04/2020 1911 by Shanon Ace, RN Outcome: Progressing

## 2020-02-04 NOTE — ED Notes (Signed)
Gave patient some crackers and apple juice patent is resting with call bell in reach

## 2020-02-04 NOTE — Progress Notes (Signed)
PROGRESS NOTE  [name] XWR:[id number] DOB: 07-20-1960 DOA: 02/03/2020 PCP: [name], FNP  HPI/Recap of past 24 hours: HPI from Dr Lolita Lenz [name] is a 59 y.o. male with medical history significant of ESRD on dialysis MWF, COPD, prior EtOH abuse (quit in 2017), A.Fib, cirrhosis, chronic pain syndrome. Pt was recently admitted for COPD exacerbation, just discharged on 7/20 on PO prednisone, hasnt finished the course of this yet (still has 1 day left).Pt last at dialysis on Fri. Pt presents to the ED with c/o SOB and CP. Located in substernal area, waxing and waning.  Nothing makes better or worse.  Symptoms are mild. In the ED, pt in A.Fib RVR with rates as high as 140 initially, came down to 100-120 on its own during ED stay. CXR with mild pulm vasc congestion. BP running low (19-147 systolic).  Patient admitted for further management.     Today, saw pt during dialysis, denies any worsening SOB, chest pain, abdominal pain, N/V, fever/chills. Wants to eat    Assessment/Plan: Principal Problem:   Atrial fibrillation with RVR (HCC) Active Problems:   COPD (chronic obstructive pulmonary disease) (HCC)   ESRD (end stage renal disease) (HCC)   Paroxysmal A. fib with RVR Heart rate currently controlled Currently on Cardizem drip, started without bolus due to hypotension Not a candidate for anticoagulation due to liver cirrhosis/alcohol abuse Continue to hold p.o. metoprolol, Cardizem, amiodarone EDP spoke with [place] [facility], follows with Dr. Einar Gip, has an appointment scheduled for 02/07/2020 Monitor no progressive floor, telemetry  Hypotension Baseline BP normally soft, around SBP 100s Asymptomatic, afebrile with leukocytosis (on steroids) BC x2 pending Chest x-ray showed no focal consolidation Monitor closely  ESRD/hyperkalemia Management by nephrology  COPD Stable, currently saturating well on room air Chest x-ray showed mild vascular  congestion, no focal consolidation Xopenex nebs as needed, start Dulera, complete prednisone Supplemental oxygen as needed  Chronic decompensated liver cirrhosis/history of hepatitis B,C/chronic alcohol use Last paracentesis done on 7/15, next scheduled on February 07 2020 Follows with radiology for periodic paracentesis Tenofovir  Chronic pain syndrome Continue home pain regimen with OxyIR 3 times daily as needed, gabapentin  Tobacco abuse Refused nicotine patch      Malnutrition Type:      Malnutrition Characteristics:      Nutrition Interventions:       Estimated body mass index is 22.89 kg/m as calculated from the following:   Height as of this encounter: 5\' 9"  (1.753 m).   Weight as of this encounter: 70.3 kg.     Code Status: Full  Family Communication: None at bedside  Disposition Plan: Status is: Inpatient  The patient will require care spanning > 2 midnights and should be moved to inpatient because: Inpatient level of care appropriate due to severity of illness  Dispo: The patient is from: Home              Anticipated d/c is to: Home              Anticipated d/c date is: 2 days              Patient currently is not medically stable to d/c.     Consultants:  Nephrology  Cardiology  Procedures:  None  Antimicrobials:  None  DVT prophylaxis: Heparin [place]   Objective: Vitals:   02/04/20 1530 02/04/20 1600 02/04/20 1630 02/04/20 1700  BP: (!) 103/53 (!) 110/52 (!) 95/48 (!) 92/45  Pulse: 104 98 94 (!)  108  Resp:      Temp:      TempSrc:      SpO2:      Weight:      Height:       No intake or output data in the 24 hours ending 02/04/20 1836 Filed Weights   02/03/20 2012 02/03/20 2015  Weight: 70 kg 70.3 kg    Exam:  General: NAD   Cardiovascular: S1, S2 present  Respiratory: CTAB  Abdomen: Soft, nontender, +distended, bowel sounds present  Musculoskeletal: No bilateral pedal edema noted  Skin: Normal  Psychiatry:  Normal mood   Data Reviewed: CBC: Recent Labs  Lab 01/29/20 0451 02/03/20 2013 02/04/20 0313  WBC 17.6* 17.2* 18.6*  HGB 11.9* 11.9* 11.8*  HCT 34.3* 34.4* 33.3*  MCV 79.8* 79.6* 79.5*  PLT 229 187 811   Basic Metabolic Panel: Recent Labs  Lab 01/29/20 0451 02/03/20 2013 02/04/20 0313  NA 135 132* 134*  K 5.1 6.1* 6.1*  CL 89* 87* 87*  CO2 27 28 29   GLUCOSE 155* 229* 113*  BUN 70* 89* 94*  CREATININE 6.25* 6.23* 6.63*  CALCIUM 10.3 10.0 10.2   GFR: Estimated Creatinine Clearance: 12.2 mL/min (A) (by C-G formula based on SCr of 6.63 mg/dL (H)). Liver Function Tests: Recent Labs  Lab 02/03/20 2013  AST 93*  ALT 151*  ALKPHOS 271*  BILITOT 1.3*  PROT 6.5  ALBUMIN 2.8*   No results for input(s): LIPASE, AMYLASE in the last 168 hours. No results for input(s): AMMONIA in the last 168 hours. Coagulation Profile: No results for input(s): INR, PROTIME in the last 168 hours. Cardiac Enzymes: No results for input(s): CKTOTAL, CKMB, CKMBINDEX, TROPONINI in the last 168 hours. BNP (last 3 results) No results for input(s): PROBNP in the last 8760 hours. HbA1C: No results for input(s): HGBA1C in the last 72 hours. CBG: Recent Labs  Lab 01/28/20 2028 01/29/20 0012 01/29/20 0349 01/29/20 1124  GLUCAP 153* 148* 133* 122*   Lipid Profile: No results for input(s): CHOL, HDL, LDLCALC, TRIG, CHOLHDL, LDLDIRECT in the last 72 hours. Thyroid Function Tests: No results for input(s): TSH, T4TOTAL, FREET4, T3FREE, THYROIDAB in the last 72 hours. Anemia Panel: No results for input(s): VITAMINB12, FOLATE, FERRITIN, TIBC, IRON, RETICCTPCT in the last 72 hours. Urine analysis:    Component Value Date/Time   COLORURINE YELLOW 07/16/2011 1619   APPEARANCEUR CLEAR 07/16/2011 1619   LABSPEC 1.018 07/16/2011 1619   PHURINE 7.5 07/16/2011 1619   GLUCOSEU 250 (A) 07/16/2011 1619   HGBUR MODERATE (A) 07/16/2011 1619   BILIRUBINUR SMALL (A) 07/16/2011 1619   KETONESUR NEGATIVE  07/16/2011 1619   PROTEINUR >300 (A) 07/16/2011 1619   UROBILINOGEN 1.0 07/16/2011 1619   NITRITE NEGATIVE 07/16/2011 1619   LEUKOCYTESUR SMALL (A) 07/16/2011 1619   Sepsis Labs: @LABRCNTIP (procalcitonin:4,lacticidven:4)  ) Recent Results (from the past 240 hour(s))  SARS Coronavirus 2 by RT PCR (hospital order, performed in [facility] hospital lab) Nasopharyngeal Nasopharyngeal Swab     Status: None   Collection Time: 01/26/20 12:47 AM   Specimen: Nasopharyngeal Swab  Result Value Ref Range Status   SARS Coronavirus 2 NEGATIVE NEGATIVE Final    Comment: (NOTE) SARS-CoV-2 target nucleic acids are NOT DETECTED.  The SARS-CoV-2 RNA is generally detectable in upper and lower respiratory specimens during the acute phase of infection. The lowest concentration of SARS-CoV-2 viral copies this assay can detect is 250 copies / mL. A negative result does not preclude SARS-CoV-2 infection and should  not be used as the sole basis for treatment or other patient management decisions.  A negative result may occur with improper specimen collection / handling, submission of specimen other than nasopharyngeal swab, presence of viral mutation(s) within the areas targeted by this assay, and inadequate number of viral copies (<250 copies / mL). A negative result must be combined with clinical observations, patient history, and epidemiological information.  Fact Sheet for Patients:   StrictlyIdeas.no  Fact Sheet for Healthcare Providers: BankingDealers.co.za  This test is not yet approved or  cleared by the [place] FDA and has been authorized for detection and/or diagnosis of SARS-CoV-2 by FDA under an Emergency Use Authorization (EUA).  This EUA will remain in effect (meaning this test can be used) for the duration of the COVID-19 declaration under Section 564(b)(1) of the Act, 21 U.S.C. section 360bbb-3(b)(1), unless the authorization is  terminated or revoked sooner.  Performed at [facility], [place] [address], [place], [place] [address]   MRSA PCR Screening     Status: None   Collection Time: 01/27/20  6:37 AM   Specimen: Nasopharyngeal  Result Value Ref Range Status   MRSA by PCR NEGATIVE NEGATIVE Final    Comment:        The GeneXpert MRSA Assay (FDA approved for NASAL specimens only), is one component of a comprehensive MRSA colonization surveillance program. It is not intended to diagnose MRSA infection nor to guide or monitor treatment for MRSA infections. Performed at [facility], [place] [address] [place], [place] [address]   SARS Coronavirus 2 by RT PCR (hospital order, performed in [facility] hospital lab) Nasopharyngeal Nasopharyngeal Swab     Status: None   Collection Time: 02/03/20  9:25 PM   Specimen: Nasopharyngeal Swab  Result Value Ref Range Status   SARS Coronavirus 2 NEGATIVE NEGATIVE Final    Comment: (NOTE) SARS-CoV-2 target nucleic acids are NOT DETECTED.  The SARS-CoV-2 RNA is generally detectable in upper and lower respiratory specimens during the acute phase of infection. The lowest concentration of SARS-CoV-2 viral copies this assay can detect is 250 copies / mL. A negative result does not preclude SARS-CoV-2 infection and should not be used as the sole basis for treatment or other patient management decisions.  A negative result may occur with improper specimen collection / handling, submission of specimen other than nasopharyngeal swab, presence of viral mutation(s) within the areas targeted by this assay, and inadequate number of viral copies (<250 copies / mL). A negative result must be combined with clinical observations, patient history, and epidemiological information.  Fact Sheet for Patients:   StrictlyIdeas.no  Fact Sheet for Healthcare Providers: BankingDealers.co.za  This test is not yet approved or  cleared  by the [place] FDA and has been authorized for detection and/or diagnosis of SARS-CoV-2 by FDA under an Emergency Use Authorization (EUA).  This EUA will remain in effect (meaning this test can be used) for the duration of the COVID-19 declaration under Section 564(b)(1) of the Act, 21 U.S.C. section 360bbb-3(b)(1), unless the authorization is terminated or revoked sooner.  Performed at [facility], [place] [address], [place], [place] [address]       Studies: DG Chest 2 View  Result Date: 02/03/2020 CLINICAL DATA:  59 year old male with chest pain and shortness of breath. EXAM: CHEST - 2 VIEW COMPARISON:  Chest radiograph dated 01/25/2020. FINDINGS: There is mild eventration of the left hemidiaphragm with left lung base atelectasis. There is no  focal consolidation, pleural effusion, or pneumothorax. Stable cardiomegaly with probable mild vascular congestion. No edema. Atherosclerotic calcification of the aorta. No acute osseous pathology. IMPRESSION: Cardiomegaly with probable mild vascular congestion. No focal consolidation. Electronically Signed   By: Anner Crete M.D.   On: 02/03/2020 20:37    Scheduled Meds: . amiodarone  200 mg Oral Daily  . Chlorhexidine Gluconate Cloth  6 each Topical Q0600  . gabapentin  300 mg Oral BID  . heparin  5,000 Units Subcutaneous Q8H  . mometasone-formoterol  2 puff Inhalation BID  . predniSONE  40 mg Oral Q breakfast  . sevelamer carbonate  2,400 mg Oral TID WC  . tenofovir  300 mg Oral Q Mon    Continuous Infusions: . diltiazem (CARDIZEM) infusion 10 mg/hr (02/04/20 1015)     LOS: 0 days     Alma Friendly, MD [facility]  If 7PM-7AM, please contact night-coverage [email or web address] 02/04/2020, 6:36 PM

## 2020-02-04 NOTE — Procedures (Signed)
Patient seen on Hemodialysis. BP (!) 112/56 (BP Location: Left Arm)   Pulse 75   Temp (!) 97.5 F (36.4 C) (Oral)   Resp 20   Ht 5\' 9"  (1.753 m)   Wt 70.3 kg   SpO2 100%   BMI 22.89 kg/m   QB 400, UF goal 2L Tolerating treatment without complaints at this time.  Elmarie Shiley MD [facility]. Office # [phone] Pager # [phone] 2:30 PM
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
## 2020-02-04 NOTE — ED Notes (Signed)
Patient refused renvela at this time.

## 2020-02-04 NOTE — ED Notes (Signed)
Lunch Tray Ordered @ 1027. °

## 2020-02-04 NOTE — Progress Notes (Signed)
I have evaluated his records, patient with recurrent A. fib with RVR, not a candidate for ablation, definitely not a candidate for AV node ablation and placement of pacemaker with high risk of infection, also has cirrhosis of the liver but no options but to use amiodarone.  I will order an echocardiogram, limited to exclude any possibility of pericardial effusion in view of hypotension.  He did undergo dialysis today without having to stop due to low blood pressure.   Adrian Prows, MD, [facility] 02/04/2020, 5:26 PM Office: [phone]

## 2020-02-04 NOTE — Progress Notes (Signed)
Called by RN. Pt complains of 10/10 pain and states when he is in the hospital they always give him Dilaudid not his only thing that works.  He has oxycodone 10 mg 3 times daily ordered.  Received dose of oxycodone 10 mg 45 minutes ago but states pain is unrelieved.  RN reports patient has not in apparent distress and is currently getting a bath. Reviewed patient's chart. Order Dilaudid 0.5 mg IV every 4 hours as needed severe pain

## 2020-02-04 NOTE — Progress Notes (Signed)
[facility] Progress Note   Subjective:   Patient seen and examined in the ED.  Reports chest pain, palpitations and shortness of breath starting yesterday morning and lasting all day.   States his symptoms are a little better now but are still present.  Had dialysis here last Sunday and Tuesday, then went to both of his OP dialysis this week, leaving early 1 of the 2 days.  Missed the extra treatment he was scheduled for on Thursday.  Had his regular paracentesis on 7/22 with net 5L removed.  After dialysis Friday he was 6L over his dry weight.  Last admit 7/16-7/20 for AoC hypoxic respiratory failure 2/2 COPD exacerbation and volume overload.  He was discharge on prednisone taper which he has not completed.  Pertinent findings in the ED include Afib w/ RVR, hypotension, K 6.1 and CXR with cardiomegaly and mild vascular congestion.  Admitted to observation for further evaluation and management.   Objective Vitals:   02/04/20 0830 02/04/20 0915 02/04/20 0930 02/04/20 1030  BP: (!) 86/60 115/78 102/73 103/78  Pulse: 89 101 56 77  Resp: 15 21 22 14   Temp:      TempSrc:      SpO2: 92% 92% 98% 100%  Weight:      Height:       Physical Exam General:chronically ill appearing male in NAD Heart:IRIR Lungs:mostly CTAB, breath sounds decreased, nml WOB Abdomen:soft, NT, +distention  Extremities:trace LE edema Dialysis Access: R AVG +b   Filed Weights   02/03/20 2012 02/03/20 2015  Weight: 70 kg 70.3 kg   No intake or output data in the 24 hours ending 02/04/20 1236  Additional Objective Labs: Basic Metabolic Panel: Recent Labs  Lab 01/29/20 0451 02/03/20 2013 02/04/20 0313  NA 135 132* 134*  K 5.1 6.1* 6.1*  CL 89* 87* 87*  CO2 27 28 29   GLUCOSE 155* 229* 113*  BUN 70* 89* 94*  CREATININE 6.25* 6.23* 6.63*  CALCIUM 10.3 10.0 10.2   Liver Function Tests: Recent Labs  Lab 02/03/20 2013  AST 93*  ALT 151*  ALKPHOS 271*  BILITOT 1.3*  PROT 6.5  ALBUMIN  2.8*   CBC: Recent Labs  Lab 01/29/20 0451 02/03/20 2013 02/04/20 0313  WBC 17.6* 17.2* 18.6*  HGB 11.9* 11.9* 11.8*  HCT 34.3* 34.4* 33.3*  MCV 79.8* 79.6* 79.5*  PLT 229 187 185   CBG: Recent Labs  Lab 01/28/20 1635 01/28/20 2028 01/29/20 0012 01/29/20 0349 01/29/20 1124  GLUCAP 150* 153* 148* 133* 122*   Studies/Results: DG Chest 2 View  Result Date: 02/03/2020 CLINICAL DATA:  59 year old male with chest pain and shortness of breath. EXAM: CHEST - 2 VIEW COMPARISON:  Chest radiograph dated 01/25/2020. FINDINGS: There is mild eventration of the left hemidiaphragm with left lung base atelectasis. There is no focal consolidation, pleural effusion, or pneumothorax. Stable cardiomegaly with probable mild vascular congestion. No edema. Atherosclerotic calcification of the aorta. No acute osseous pathology. IMPRESSION: Cardiomegaly with probable mild vascular congestion. No focal consolidation. Electronically Signed   By: Anner Crete M.D.   On: 02/03/2020 20:37    Medications: . diltiazem (CARDIZEM) infusion 10 mg/hr (02/04/20 1015)   . Chlorhexidine Gluconate Cloth  6 each Topical Q0600  . gabapentin  300 mg Oral BID  . heparin  5,000 Units Subcutaneous Q8H  . predniSONE  40 mg Oral Q breakfast  . sevelamer carbonate  2,400 mg Oral TID WC  . tenofovir  300 mg Oral Q Mon  Dialysis Orders: [facility] MWF 4 hrs 180NRe 450/800 61 kg 2.0K/2.0 Ca R AVG -No heparin  -Calcitriol 0.75 mcg PO TIW  Assessment/Plan: 1. Hyperkalemia - lokelma given last night, K 6.1 today.  Plan for dialysis today.  2. Volume overload/Dyspnea- 9kg over night weight, part of it is due to recurrent ascites but also with edema on exam. CXR with vascular congestion. Plan for UF 4L today as tolerated. Likley needs extra treatment which may be able to be done as outpatient if d/c.  3.  A fib w/RVR - holding amiodarone, metoprolol d/t hypotension. Cardizem drip. Not on anticoagulation. Per primary/card 4.  ESRD -on HD MWF.  HD today per regular schedule  5. Anemia of CKD- Hgb 11.8. No indication 6. Secondary hyperparathyroidism - Ca at goal. Will check phos. Continue VDRA and binders.  7. Hypotension-  Metoprolol held. May need to try midodrine with HD if UF limited by hypotension.  8. Nutrition - Renal diet w/fluid restriction.  9. COPD- per primary 10. Chronic pain syndrome - on home meds 11. Cirrhosis w/chronic ascites - gets weekly paracentesis. 5L removed 7/22. 12. Non compliance w/HD - chronic issues.  Typically shortens dialysis.  Discussed compliance with dialysis and fluid restrictions.     [name], PA-C [facility] 02/04/2020,12:36 PM  LOS: 0 days

## 2020-02-05 ENCOUNTER — Inpatient Hospital Stay (HOSPITAL_COMMUNITY): Payer: Medicare Other

## 2020-02-05 DIAGNOSIS — J9601 Acute respiratory failure with hypoxia: Secondary | ICD-10-CM

## 2020-02-05 DIAGNOSIS — J432 Centrilobular emphysema: Secondary | ICD-10-CM

## 2020-02-05 LAB — CBC WITH DIFFERENTIAL/PLATELET
Abs Immature Granulocytes: 0.14 10*3/uL — ABNORMAL HIGH (ref 0.00–0.07)
Basophils Absolute: 0 10*3/uL (ref 0.0–0.1)
Basophils Relative: 0 %
Eosinophils Absolute: 0.1 10*3/uL (ref 0.0–0.5)
Eosinophils Relative: 1 %
HCT: 30.9 % — ABNORMAL LOW (ref 39.0–52.0)
Hemoglobin: 10.7 g/dL — ABNORMAL LOW (ref 13.0–17.0)
Immature Granulocytes: 1 %
Lymphocytes Relative: 8 %
Lymphs Abs: 1.2 10*3/uL (ref 0.7–4.0)
MCH: 27.3 pg (ref 26.0–34.0)
MCHC: 34.6 g/dL (ref 30.0–36.0)
MCV: 78.8 fL — ABNORMAL LOW (ref 80.0–100.0)
Monocytes Absolute: 1.3 10*3/uL — ABNORMAL HIGH (ref 0.1–1.0)
Monocytes Relative: 9 %
Neutro Abs: 11.6 10*3/uL — ABNORMAL HIGH (ref 1.7–7.7)
Neutrophils Relative %: 81 %
Platelets: 178 10*3/uL (ref 150–400)
RBC: 3.92 MIL/uL — ABNORMAL LOW (ref 4.22–5.81)
RDW: 16.7 % — ABNORMAL HIGH (ref 11.5–15.5)
WBC: 14.3 10*3/uL — ABNORMAL HIGH (ref 4.0–10.5)
nRBC: 0.1 % (ref 0.0–0.2)

## 2020-02-05 LAB — ECHOCARDIOGRAM LIMITED
AR max vel: 1.46 cm2
AV Area VTI: 1.59 cm2
AV Area mean vel: 1.6 cm2
AV Mean grad: 8.3 mmHg
AV Peak grad: 14 mmHg
Ao pk vel: 1.87 m/s
Area-P 1/2: 1.22 cm2
Height: 69 in
Weight: 2480 oz

## 2020-02-05 LAB — RENAL FUNCTION PANEL
Albumin: 2.7 g/dL — ABNORMAL LOW (ref 3.5–5.0)
Anion gap: 13 (ref 5–15)
BUN: 50 mg/dL — ABNORMAL HIGH (ref 6–20)
CO2: 28 mmol/L (ref 22–32)
Calcium: 9.6 mg/dL (ref 8.9–10.3)
Chloride: 93 mmol/L — ABNORMAL LOW (ref 98–111)
Creatinine, Ser: 4.27 mg/dL — ABNORMAL HIGH (ref 0.61–1.24)
GFR calc Af Amer: 17 mL/min — ABNORMAL LOW (ref >60)
GFR calc non Af Amer: 14 mL/min — ABNORMAL LOW (ref >60)
Glucose, Bld: 102 mg/dL — ABNORMAL HIGH (ref 70–99)
Phosphorus: 4.6 mg/dL (ref 2.5–4.6)
Potassium: 5.5 mmol/L — ABNORMAL HIGH (ref 3.5–5.1)
Sodium: 134 mmol/L — ABNORMAL LOW (ref 135–145)

## 2020-02-05 LAB — GLUCOSE, CAPILLARY
Glucose-Capillary: 134 mg/dL — ABNORMAL HIGH (ref 70–99)
Glucose-Capillary: 89 mg/dL (ref 70–99)

## 2020-02-05 MED ORDER — HYDROMORPHONE HCL 1 MG/ML IJ SOLN
0.5000 mg | Freq: Once | INTRAMUSCULAR | Status: AC
  Administered 2020-02-05: 0.5 mg via INTRAVENOUS
  Filled 2020-02-05: qty 0.5

## 2020-02-05 MED ORDER — HYDROMORPHONE HCL 1 MG/ML IJ SOLN
INTRAMUSCULAR | Status: AC
  Administered 2020-02-05: 1 mg via INTRAVENOUS
  Filled 2020-02-05: qty 1

## 2020-02-05 MED ORDER — HYDROMORPHONE HCL 1 MG/ML IJ SOLN: 1.0000 mg | Freq: Once | INTRAMUSCULAR | Status: AC

## 2020-02-05 NOTE — Procedures (Signed)
Patient seen on Hemodialysis. BP 119/70 (BP Location: Left Arm)   Pulse 100   Temp 98.2 F (36.8 C) (Oral)   Resp 21   Ht 5\' 9"  (1.753 m)   Wt 70.3 kg   SpO2 97%   BMI 22.89 kg/m   QB 400, UF goal 2L Tolerating treatment without complaints at this time.   Elmarie Shiley MD [facility]. Office # [phone] 2:05 PM

## 2020-02-05 NOTE — Progress Notes (Signed)
Patient ID: [name], male   DOB: [birth year], 59 y.o.   MRN: [id number]  [facility] Progress Note   Assessment/ Plan:   1.  Hyperkalemia/volume overload: I will order for hemodialysis again today for management of both hyperkalemia and to augment volume unloading with ultrafiltration. 2.  Atrial fibrillation with rapid ventricular response: Seen by Dr. Einar Gip yesterday with recommendations for repeat echocardiogram to exclude pericardial effusion; not a candidate for ablation/PPM due to infection risk. 3. ESRD: He is usually on Monday/Wednesday/Friday dialysis schedule and underwent hemodialysis yesterday emergently for volume/hyperkalemia-we will get additional dialysis today. 4. Anemia: Hemoglobin and hematocrit within acceptable range, no overt loss.  No ESA. 5. CKD-MBD: Calcium and phosphorus levels currently at goal, reiterated adherence with low phosphorus diet and continue binders.  6.  Chronic pain syndrome: With breakthrough pain overnight requiring initiation of intravenous Dilaudid to augment ongoing oxycodone and gabapentin.  Subjective:   Complains of uncomfortable night with pain-improved with Dilaudid.   Objective:   BP 119/70 (BP Location: Left Arm)   Pulse 100   Temp 98.2 F (36.8 C) (Oral)   Resp 15   Ht 5\' 9"  (1.753 m)   Wt 70.3 kg   SpO2 97%   BMI 22.89 kg/m   Physical Exam: Gen: Comfortably resting in bed, easy to awaken CVS: Pulse irregularly irregular, marginal tachycardia. Resp: Coarse breath sounds bilaterally without distinct rales or rhonchi Abd: Soft, flat, nontender Ext: Trace lower extremity edema.  Right upper arm AVG with palpable thrill.  Labs: BMET Recent Labs  Lab 02/03/20 2013 02/04/20 0313 02/05/20 0255  NA 132* 134* 134*  K 6.1* 6.1* 5.5*  CL 87* 87* 93*  CO2 28 29 28   GLUCOSE 229* 113* 102*  BUN 89* 94* 50*  CREATININE 6.23* 6.63* 4.27*  CALCIUM 10.0 10.2 9.6  PHOS  --   --  4.6   CBC Recent Labs   Lab 02/03/20 2013 02/04/20 0313 02/05/20 0255  WBC 17.2* 18.6* 14.3*  NEUTROABS  --   --  11.6*  HGB 11.9* 11.8* 10.7*  HCT 34.4* 33.3* 30.9*  MCV 79.6* 79.5* 78.8*  PLT 187 185 178     Medications:    . amiodarone  200 mg Oral Daily  . Chlorhexidine Gluconate Cloth  6 each Topical Q0600  . gabapentin  300 mg Oral BID  . heparin  5,000 Units Subcutaneous Q8H  . mometasone-formoterol  2 puff Inhalation BID  . predniSONE  40 mg Oral Q breakfast  . sevelamer carbonate  2,400 mg Oral TID WC  . tenofovir  300 mg Oral Q Mon   Elmarie Shiley, MD 02/05/2020, 8:42 AM

## 2020-02-05 NOTE — Progress Notes (Signed)
Echocardiogram 2D Echocardiogram has been performed.  [name] 02/05/2020, 9:56 AM

## 2020-02-05 NOTE — Consult Note (Signed)
CARDIOLOGY CONSULT NOTE  Patient ID: [name] MRN: [id number] DOB/AGE: 12-04-1960 59 y.o.  Admit date: 02/03/2020 Referring Physician: [facility] Reason for Consultation:  Tachycardia. Atrial fibrillation  HPI:   59 y/o African Americanmale with hypertension, CAD (mid RCA PCI 02/2017), persistent Afib/flutter, severe COPD, ESRD on hemodialysis, chronic hepatitis B and C with liver cirrhosis and ascites, prior h/o GI bleed, h/o polysubstance abuse, admitted with hyperkalemia, fluid overload state and hypotension.   I had reviewed his chart yesterday, I had reinitiated amiodarone in view of low blood pressure and A. fib with RVR.  He was able to undergo dialysis yesterday and again today due to persistent hyperkalemia and fluid overload state without any complications.  He underwent echocardiogram to exclude pericardial effusion in view of tachycardia and hypotension and renal and liver disease.  He just returned back from the dialysis, states that he is feeling well and would like to go home.  Has chronic dyspnea.  Denies palpitations, chest pain.  Past Medical History:  Diagnosis Date  . Anemia   . Anxiety   . Arthritis    left shoulder  . Atherosclerosis of aorta ([place])   . Cardiomegaly   . Chest pain    DATE UNKNOWN, C/O PERIODICALLY  . Cocaine abuse ([place])   . COPD exacerbation ([place]) 08/17/2016  . Coronary artery disease    stent 02/22/17  . ESRD (end stage renal disease) on dialysis ([place])    "[name]; MWF" (07/04/2017)  . GERD (gastroesophageal reflux disease)    DATE UNKNOWN  . Hemorrhoids   . Hepatitis B, chronic ([place])   . Hepatitis C   . History of kidney stones   . Hyperkalemia   . Hypertension   . Kidney failure   . Metabolic bone disease    Patient denies  . Mitral stenosis   . Myocardial infarction ([place])   . Pneumonia   . Pulmonary edema   . Solitary rectal ulcer syndrome 07/2017   at flex sig for rectal bleeding  . Tubular adenoma of  colon      Past Surgical History:  Procedure Laterality Date  . A/V FISTULAGRAM Left 05/26/2017   Procedure: A/V FISTULAGRAM;  Surgeon: Conrad Salvo, MD;  Location: [place] [facility];  Service: Cardiovascular;  Laterality: Left;  . A/V FISTULAGRAM Right 11/18/2017   Procedure: A/V FISTULAGRAM - Right Arm;  Surgeon: Elam Dutch, MD;  Location: [place] [facility];  Service: Cardiovascular;  Laterality: Right;  . APPLICATION OF WOUND VAC Left 06/14/2017   Procedure: APPLICATION OF WOUND VAC;  Surgeon: Katha Cabal, MD;  Location: [facility];  Service: Vascular;  Laterality: Left;  . AV FISTULA PLACEMENT  2012   BELIEVED WAS PLACED IN JUNE  . AV FISTULA PLACEMENT Right 08/09/2017   Procedure: Creation Right arm ARTERIOVENOUS BRACHIOCEPOHALIC FISTULA;  Surgeon: Elam Dutch, MD;  Location: [facility] OR;  Service: Vascular;  Laterality: Right;  . AV FISTULA PLACEMENT Right 11/22/2017   Procedure: INSERTION OF ARTERIOVENOUS (AV) GORE-TEX GRAFT RIGHT UPPER ARM;  Surgeon: Elam Dutch, MD;  Location: [place];  Service: Vascular;  Laterality: Right;  . BIOPSY  01/25/2018   Procedure: BIOPSY;  Surgeon: Jerene Bears, MD;  Location: [place];  Service: Gastroenterology;;  . BIOPSY  04/10/2019   Procedure: BIOPSY;  Surgeon: Jerene Bears, MD;  Location: [facility];  Service: Gastroenterology;;  . COLONOSCOPY    . COLONOSCOPY WITH PROPOFOL N/A 01/25/2018   Procedure: COLONOSCOPY WITH PROPOFOL;  Surgeon: Jerene Bears, MD;  Location: [facility];  Service: Gastroenterology;  Laterality: N/A;  . CORONARY STENT INTERVENTION N/A 02/22/2017   Procedure: CORONARY STENT INTERVENTION;  Surgeon: Nigel Mormon, MD;  Location: [place] [facility];  Service: Cardiovascular;  Laterality: N/A;  . ESOPHAGOGASTRODUODENOSCOPY (EGD) WITH PROPOFOL N/A 01/25/2018   Procedure: ESOPHAGOGASTRODUODENOSCOPY (EGD) WITH PROPOFOL;  Surgeon: Jerene Bears, MD;  Location: [place];  Service: Gastroenterology;   Laterality: N/A;  . ESOPHAGOGASTRODUODENOSCOPY (EGD) WITH PROPOFOL N/A 04/10/2019   Procedure: ESOPHAGOGASTRODUODENOSCOPY (EGD) WITH PROPOFOL;  Surgeon: Jerene Bears, MD;  Location: [facility];  Service: Gastroenterology;  Laterality: N/A;  . FLEXIBLE SIGMOIDOSCOPY N/A 07/15/2017   Procedure: FLEXIBLE SIGMOIDOSCOPY;  Surgeon: Carol Ada, MD;  Location: [place];  Service: Endoscopy;  Laterality: N/A;  . HEMORRHOID BANDING    . I & D EXTREMITY Left 06/01/2017   Procedure: IRRIGATION AND DEBRIDEMENT LEFT ARM HEMATOMA WITH LIGATION OF LEFT ARM AV FISTULA;  Surgeon: Elam Dutch, MD;  Location: [place];  Service: Vascular;  Laterality: Left;  . I & D EXTREMITY Left 06/14/2017   Procedure: IRRIGATION AND DEBRIDEMENT EXTREMITY;  Surgeon: Katha Cabal, MD;  Location: [facility];  Service: Vascular;  Laterality: Left;  . INSERTION OF DIALYSIS CATHETER  05/30/2017  . INSERTION OF DIALYSIS CATHETER N/A 05/30/2017   Procedure: INSERTION OF DIALYSIS CATHETER;  Surgeon: Elam Dutch, MD;  Location: [place];  Service: Vascular;  Laterality: N/A;  . IR PARACENTESIS  08/30/2017  . IR PARACENTESIS  09/29/2017  . IR PARACENTESIS  10/28/2017  . IR PARACENTESIS  11/09/2017  . IR PARACENTESIS  11/16/2017  . IR PARACENTESIS  11/28/2017  . IR PARACENTESIS  12/01/2017  . IR PARACENTESIS  12/06/2017  . IR PARACENTESIS  01/03/2018  . IR PARACENTESIS  01/23/2018  . IR PARACENTESIS  02/07/2018  . IR PARACENTESIS  02/21/2018  . IR PARACENTESIS  03/06/2018  . IR PARACENTESIS  03/17/2018  . IR PARACENTESIS  04/04/2018  . IR PARACENTESIS  12/28/2018  . IR PARACENTESIS  01/08/2019  . IR PARACENTESIS  01/23/2019  . IR PARACENTESIS  02/01/2019  . IR PARACENTESIS  02/19/2019  . IR PARACENTESIS  03/01/2019  . IR PARACENTESIS  03/15/2019  . IR PARACENTESIS  04/03/2019  . IR PARACENTESIS  04/12/2019  . IR PARACENTESIS  05/01/2019  . IR PARACENTESIS  05/08/2019  . IR PARACENTESIS  05/24/2019  . IR PARACENTESIS  06/12/2019  . IR  PARACENTESIS  07/09/2019  . IR PARACENTESIS  07/27/2019  . IR PARACENTESIS  08/09/2019  . IR PARACENTESIS  08/21/2019  . IR PARACENTESIS  09/17/2019  . IR PARACENTESIS  10/05/2019  . IR PARACENTESIS  10/29/2019  . IR PARACENTESIS  11/08/2019  . IR PARACENTESIS  12/12/2019  . IR PARACENTESIS  01/03/2020  . IR PARACENTESIS  01/10/2020  . IR PARACENTESIS  01/17/2020  . IR PARACENTESIS  01/24/2020  . IR PARACENTESIS  01/31/2020  . IR RADIOLOGIST EVAL & MGMT  02/14/2018  . IR RADIOLOGIST EVAL & MGMT  02/22/2019  . LEFT HEART CATH AND CORONARY ANGIOGRAPHY N/A 02/22/2017   Procedure: LEFT HEART CATH AND CORONARY ANGIOGRAPHY;  Surgeon: Nigel Mormon, MD;  Location: [place] [facility];  Service: Cardiovascular;  Laterality: N/A;  . LIGATION OF ARTERIOVENOUS  FISTULA Left 11/10/7614   Procedure: Plication of Left Arm Arteriovenous Fistula;  Surgeon: Elam Dutch, MD;  Location: [place];  Service: Vascular;  Laterality: Left;  . POLYPECTOMY    .  POLYPECTOMY  01/25/2018   Procedure: POLYPECTOMY;  Surgeon: Jerene Bears, MD;  Location: [place];  Service: Gastroenterology;;  . REVISON OF ARTERIOVENOUS FISTULA Left 0/99/8338   Procedure: PLICATION OF DISTAL ANEURYSMAL SEGEMENT OF LEFT UPPER ARM ARTERIOVENOUS FISTULA;  Surgeon: Elam Dutch, MD;  Location: [place];  Service: Vascular;  Laterality: Left;  . REVISON OF ARTERIOVENOUS FISTULA Left 2/50/5397   Procedure: Plication of Left Upper Arm Fistula ;  Surgeon: Waynetta Sandy, MD;  Location: [place];  Service: Vascular;  Laterality: Left;  . SKIN GRAFT SPLIT THICKNESS LEG / FOOT Left    SKIN GRAFT SPLIT THICKNESS LEFT ARM DONOR SITE: LEFT ANTERIOR THIGH  . SKIN SPLIT GRAFT Left 07/04/2017   Procedure: SKIN GRAFT SPLIT THICKNESS LEFT ARM DONOR SITE: LEFT ANTERIOR THIGH;  Surgeon: Elam Dutch, MD;  Location: [place];  Service: Vascular;  Laterality: Left;  . THROMBECTOMY W/ EMBOLECTOMY Left 06/05/2017   Procedure: EXPLORATION OF LEFT ARM FOR  BLEEDING; OVERSEWED PROXIMAL FISTULA;  Surgeon: Angelia Mould, MD;  Location: [place];  Service: Vascular;  Laterality: Left;  . WOUND EXPLORATION Left 06/03/2017   Procedure: WOUND EXPLORATION WITH WOUND VAC APPLICATION TO LEFT ARM;  Surgeon: Angelia Mould, MD;  Location: [facility] OR;  Service: Vascular;  Laterality: Left;     Family History  Problem Relation Age of Onset  . Heart disease Mother   . Lung cancer Mother   . Heart disease Father   . Malignant hyperthermia Father   . COPD Father   . Throat cancer Sister   . Esophageal cancer Sister   . Hypertension Other   . COPD Other   . Colon cancer Neg Hx   . Colon polyps Neg Hx   . Rectal cancer Neg Hx   . Stomach cancer Neg Hx      Social History: Social History   Socioeconomic History  . Marital status: Single    Spouse name: Not on file  . Number of children: 3  . Years of education: 10  . Highest education level: Not on file  Occupational History  . Occupation: Unemployed  Tobacco Use  . Smoking status: Current Every Day Smoker    Packs/day: 0.50    Years: 43.00    Pack years: 21.50    Types: Cigarettes    Start date: 08/13/1973  . Smokeless tobacco: Never Used  . Tobacco comment: i dont know i just make   Vaping Use  . Vaping Use: Never used  Substance and Sexual Activity  . Alcohol use: Not Currently    Comment: quit drinking in 2017  . Drug use: Not Currently    Types: Marijuana, Cocaine    Comment: reports using once every 3 months, 04-06-2019 was this   . Sexual activity: Not Currently    Birth control/protection: None  Other Topics Concern  . Not on file  Social History Narrative   Lives alone   Caffeine use: Coffee-rare   Soda- daily      [facility] (03/10/17):   Originally from [facility]. Previously worked trimming trees. No pets currently. No bird or mold exposure.    Social Determinants of Health   Financial Resource Strain:   . Difficulty of Paying Living Expenses:   Food  Insecurity:   . Worried About Charity fundraiser in the Last Year:   . Arboriculturist in the Last Year:   Transportation Needs:   . Film/video editor (Medical):   Marland Kitchen  Lack of Transportation (Non-Medical):   Physical Activity:   . Days of Exercise per Week:   . Minutes of Exercise per Session:   Stress:   . Feeling of Stress :   Social Connections:   . Frequency of Communication with Friends and Family:   . Frequency of Social Gatherings with Friends and Family:   . Attends Religious Services:   . Active Member of Clubs or Organizations:   . Attends Archivist Meetings:   Marland Kitchen Marital Status:   Intimate Partner Violence:   . Fear of Current or Ex-Partner:   . Emotionally Abused:   Marland Kitchen Physically Abused:   . Sexually Abused:      Medications Prior to Admission  Medication Sig Dispense Refill Last Dose  . albuterol (VENTOLIN HFA) 108 (90 Base) MCG/ACT inhaler Inhale 2 puffs into the lungs every 4 (four) hours as needed for wheezing or shortness of breath.    02/03/2020 at am  . amiodarone (PACERONE) 200 MG tablet Take 1 tablet (200 mg total) by mouth daily. 30 tablet 1 02/03/2020 at am  . diltiazem (CARDIZEM CD) 240 MG 24 hr capsule Take 240 mg by mouth daily.   02/03/2020 at am  . diphenhydrAMINE (BENADRYL) 25 mg capsule Take 1 capsule (25 mg total) by mouth every 6 (six) hours as needed for itching or allergies. 30 capsule 0 02/03/2020 at qm  . gabapentin (NEURONTIN) 300 MG capsule Take 1 capsule (300 mg total) by mouth 2 (two) times daily. 120 capsule 3 02/03/2020 at am  . metoprolol tartrate (LOPRESSOR) 25 MG tablet Take 0.5 tablets (12.5 mg total) by mouth 2 (two) times daily. (Patient taking differently: Take 25 mg by mouth 2 (two) times daily. ) 60 tablet 1 02/03/2020 at 9a-12p  . Oxycodone HCl 10 MG TABS Take 10 mg by mouth 3 (three) times daily as needed (pain).    couple days ago  . sevelamer carbonate (RENVELA) 800 MG tablet Take 2,400 mg by mouth See admin instructions.  Take 3 tablets (2400 mg) by mouth up to three times daily with meals   02/03/2020 at afternoon  . tenofovir (VIREAD) 300 MG tablet Take 300 mg by mouth every Monday.    week ago    Review of Systems  Constitutional: Negative for decreased appetite, malaise/fatigue, weight gain and weight loss.  HENT: Negative for congestion.   Eyes: Negative for visual disturbance.  Cardiovascular: Positive for dyspnea on exertion. Negative for chest pain, leg swelling, palpitations and syncope.  Respiratory: Positive for cough.   Endocrine: Negative for cold intolerance.  Hematologic/Lymphatic: Does not bruise/bleed easily.  Skin: Negative for itching and rash.  Musculoskeletal: Negative for myalgias.  Gastrointestinal: Positive for bloating. Negative for abdominal pain, nausea and vomiting.  Genitourinary: Negative for dysuria.  Neurological: Negative for dizziness and weakness.  Psychiatric/Behavioral: The patient is not nervous/anxious.   All other systems reviewed and are negative.     Physical Exam: Physical Exam Vitals and nursing note reviewed.  Constitutional:      General: He is not in acute distress.    Appearance: He is well-developed.  HENT:     Head: Normocephalic and atraumatic.  Eyes:     Conjunctiva/sclera: Conjunctivae normal.     Pupils: Pupils are equal, round, and reactive to light.  Neck:     Vascular: No JVD.  Cardiovascular:     Rate and Rhythm: Regular rhythm. Tachycardia present.     Pulses: Normal pulses and intact distal pulses.  Heart sounds: Murmur heard.  Low-pitched rumbling crescendo presystolic murmur is present with a grade of 2/4 at the apex.   Pulmonary:     Effort: Pulmonary effort is normal.     Breath sounds: Normal breath sounds. No wheezing or rales.  Abdominal:     General: Bowel sounds are normal.     Palpations: Abdomen is soft.     Tenderness: There is no rebound.     Comments: Distended. Ascites present. No organomegaly    Musculoskeletal:        General: No tenderness. Normal range of motion.     Left lower leg: No edema.  Lymphadenopathy:     Cervical: No cervical adenopathy.  Skin:    General: Skin is warm and dry.  Neurological:     Mental Status: He is alert and oriented to person, place, and time.     Cranial Nerves: No cranial nerve deficit.    Labs:   Lab Results  Component Value Date   WBC 14.3 (H) 02/05/2020   HGB 10.7 (L) 02/05/2020   HCT 30.9 (L) 02/05/2020   MCV 78.8 (L) 02/05/2020   PLT 178 02/05/2020    Recent Labs  Lab 02/03/20 2013 02/04/20 0313 02/05/20 0255  NA 132*   < > 134*  K 6.1*   < > 5.5*  CL 87*   < > 93*  CO2 28   < > 28  BUN 89*   < > 50*  CREATININE 6.23*   < > 4.27*  CALCIUM 10.0   < > 9.6  PROT 6.5  --   --   BILITOT 1.3*  --   --   ALKPHOS 271*  --   --   ALT 151*  --   --   AST 93*  --   --   GLUCOSE 229*   < > 102*   < > = values in this interval not displayed.    Lipid Panel     Component Value Date/Time   CHOL 103 08/17/2016 0134   TRIG 183 (H) 10/04/2019 1400   HDL 34 (L) 08/17/2016 0134   CHOLHDL 3.0 08/17/2016 0134   VLDL 19 08/17/2016 0134   LDLCALC 50 08/17/2016 0134    BNP (last 3 results) Recent Labs    10/06/19 1159 11/12/19 2341 01/14/20 1927  BNP 2,690.1* 3,672.9* 2,254.1*    HEMOGLOBIN A1C Lab Results  Component Value Date   HGBA1C 4.8 01/26/2020   MPG 91.06 01/26/2020     Lab Results  Component Value Date   CKTOTAL 274 (H) 12/23/2010   CKMB 4.6 (H) 12/23/2010      Radiology: DG Chest 2 View  Result Date: 02/03/2020 CLINICAL DATA:  59 year old male with chest pain and shortness of breath. EXAM: CHEST - 2 VIEW COMPARISON:  Chest radiograph dated 01/25/2020. FINDINGS: There is mild eventration of the left hemidiaphragm with left lung base atelectasis. There is no focal consolidation, pleural effusion, or pneumothorax. Stable cardiomegaly with probable mild vascular congestion. No edema. Atherosclerotic  calcification of the aorta. No acute osseous pathology. IMPRESSION: Cardiomegaly with probable mild vascular congestion. No focal consolidation. Electronically Signed   By: Anner Crete M.D.   On: 02/03/2020 20:37   ECHOCARDIOGRAM LIMITED  Result Date: 02/05/2020    ECHOCARDIOGRAM LIMITED REPORT   Patient Name:   [name] Date of Exam: 02/05/2020 Medical Rec #:  [id number]              Height:  69.0 in Accession #:    [id number]             Weight:       155.0 lb Date of Birth:  1960-08-31               BSA:          1.854 m Patient Age:    39 years               BP:           101/74 mmHg Patient Gender: M                      HR:           77 bpm. Exam Location:  Inpatient Procedure: 2D Echo Indications:    Shortness of breath  History:        Patient has prior history of Echocardiogram examinations.  Sonographer:    Raquel Sarna Senior Referring Phys: [place]  1. Left ventricular ejection fraction, by estimation, is 50 to 55%. The left ventricle has normal function. There is severe left ventricular hypertrophy. Left ventricular diastolic function could not be evaluated. I cannot exclude septal hypokinesis. Endocardium not well visualized. septal motion also is consistent with LBBB.  2. Right ventricular systolic function is mildly reduced. The right ventricular size is moderately enlarged. Severely increased right ventricular wall thickness. There is moderately elevated pulmonary artery systolic pressure. The estimated right ventricular systolic pressure is 41.3 mmHg.  3. Left atrial size was severely dilated.  4. Right atrial size was severely dilated.  5. The mitral valve is normal in structure. Mild mitral valve regurgitation.  6. Dilated TV annulus. Tricuspid valve regurgitation is moderate to severe.  7. AV sclerosis without stenosis. The aortic valve is tricuspid. Aortic valve regurgitation is trivial. Mild aortic valve sclerosis is present, with no evidence of aortic  valve stenosis. Aortic valve mean gradient measures 8.3 mmHg.  8. The inferior vena cava is dilated in size with <50% respiratory variability, suggesting right atrial pressure of 15 mmHg. FINDINGS  Left Ventricle: Left ventricular ejection fraction, by estimation, is 50 to 55%. The left ventricle has normal function. There is severe left ventricular hypertrophy. Abnormal (paradoxical) septal motion, consistent with left bundle branch block.  LV Wall Scoring: I cannot exclude septal hypokinesis. Endocardium not well visualized. septal motion also is consistent with LBBB. Right Ventricle: The right ventricular size is moderately enlarged. Severely increased right ventricular wall thickness. Right ventricular systolic function is mildly reduced. There is moderately elevated pulmonary artery systolic pressure. The tricuspid  regurgitant velocity is 3.34 m/s, and with an assumed right atrial pressure of 15 mmHg, the estimated right ventricular systolic pressure is 24.4 mmHg. Left Atrium: Left atrial size was severely dilated. Right Atrium: Right atrial size was severely dilated. Pericardium: Trivial pericardial effusion is present. Mitral Valve: The mitral valve is normal in structure. Mild mitral valve regurgitation. MV peak gradient, 20.1 mmHg. The mean mitral valve gradient is 6.0 mmHg. Tricuspid Valve: Dilated TV annulus. The tricuspid valve is normal in structure. Tricuspid valve regurgitation is moderate to severe. Aortic Valve: AV sclerosis without stenosis. The aortic valve is tricuspid. Aortic valve regurgitation is trivial. Mild aortic valve sclerosis is present, with no evidence of aortic valve stenosis. There is mild calcification of the aortic valve. Aortic valve mean gradient measures 8.3 mmHg. Aortic valve peak gradient measures 14.0 mmHg. Aortic valve area, by VTI measures 1.59 cm. Pulmonic  Valve: The pulmonic valve was normal in structure. Pulmonic valve regurgitation is trivial. Aorta: The aortic root  is normal in size and structure. Pulmonary Artery: The pulmonary artery is not well seen. Venous: The inferior vena cava is dilated in size with less than 50% respiratory variability, suggesting right atrial pressure of 15 mmHg. IAS/Shunts: No atrial level shunt detected by color flow Doppler. Additional Comments: There is no pleural effusion. LEFT VENTRICLE PLAX 2D LVOT diam:     2.00 cm LV SV:         62 LV SV Index:   33 LVOT Area:     3.14 cm  AORTIC VALVE AV Area (Vmax):    1.46 cm AV Area (Vmean):   1.60 cm AV Area (VTI):     1.59 cm AV Vmax:           187.00 cm/s AV Vmean:          132.000 cm/s AV VTI:            0.389 m AV Peak Grad:      14.0 mmHg AV Mean Grad:      8.3 mmHg LVOT Vmax:         86.80 cm/s LVOT Vmean:        67.100 cm/s LVOT VTI:          0.197 m LVOT/AV VTI ratio: 0.51 MITRAL VALVE              TRICUSPID VALVE MV Area (PHT): 1.22 cm   TR Peak grad:   44.6 mmHg MV Peak grad:  20.1 mmHg  TR Vmax:        334.00 cm/s MV Mean grad:  6.0 mmHg MV Vmax:       2.24 m/s   SHUNTS MV Vmean:      103.0 cm/s Systemic VTI:  0.20 m                           Systemic Diam: 2.00 cm Adrian Prows MD Electronically signed by Adrian Prows MD Signature Date/Time: 02/05/2020/10:08:07 AM    Final     Scheduled Meds: . amiodarone  200 mg Oral Daily  . Chlorhexidine Gluconate Cloth  6 each Topical Q0600  . gabapentin  300 mg Oral BID  . heparin  5,000 Units Subcutaneous Q8H  . mometasone-formoterol  2 puff Inhalation BID  . predniSONE  40 mg Oral Q breakfast  . sevelamer carbonate  2,400 mg Oral TID WC  . tenofovir  300 mg Oral Q Mon   Continuous Infusions: . diltiazem (CARDIZEM) infusion 10 mg/hr (02/05/20 0947)   PRN Meds:.acetaminophen, levalbuterol, ondansetron (ZOFRAN) IV, oxyCODONE  CARDIAC STUDIES:  EKG 01/26/2020: Atrial flutter with RVR 150 bpm Underlying LBBB  Echocardiogram 02/05/2020:  No significant change from 10/04/2019: 1. Left ventricular ejection fraction, by estimation, is 50  to 55%. The  left ventricle has normal function. There is severe left ventricular  hypertrophy. Left ventricular diastolic function could not be evaluated. I  cannot exclude septal hypokinesis.  Endocardium not well visualized. septal motion also is consistent with  LBBB.  2. Right ventricular systolic function is mildly reduced. The right  ventricular size is moderately enlarged. Severely increased right  ventricular wall thickness. There is moderately elevated pulmonary artery systolic pressure. The estimated right  ventricular systolic pressure is 11.9 mmHg.  3. Left atrial size was severely dilated.  4. Right atrial size was severely dilated.  5. The  mitral valve is normal in structure. Mild mitral valve  regurgitation.  6. Dilated TV annulus. Tricuspid valve regurgitation is moderate to  severe.  7. AV sclerosis without stenosis. The aortic valve is tricuspid. Aortic valve regurgitation is trivial. Mild aortic valve sclerosis is present,  with no evidence of aortic valve stenosis. Aortic valve mean gradient  measures 8.3 mmHg.  8. The inferior vena cava is dilated in size with <50% respiratory  variability, suggesting right atrial pressure of 15 mmHg. .  Assessment & Recommendations:  [name]  is a 59 y.o.  male  with verycomplex medical problems including severe COPD and chronic cor pulmonale with pulmonary hypertension, coronary artery disease s/p stenting to mid RCA in 2018, paroxysmal atrial fibrillation with RVR, hypertensive cardiomyopathy, dietary and medication noncompliance, end-stage renal disease on hemodialysis, chronic hepatitis B and C, cirrhosis of the liver with ascites,tobaccouse, smokes 1/2 ppd, h/omarijuana use andprior cocaine use quit in 2017. Not on anticoagulation due to cirrhosis of liver and compliance with Warfarin.  Paroxysmal atrial fibrillation ([place]). CHA2DS2-VASc Score is 2. Yearly risk of stroke: 2.3% (Hypertension and CHF) CHA2DS2-VASc  Score is 2. Yearly risk of stroke: 2.3% (Hypertension and CHF)  Chronic cor pulmonale with severe pulmonary hypertension Left bundle branch block Noncompliance with medical advice  Recommendation: I have discussed with the patient regarding being compliant with taking all his medications including amiodarone.  He presents with fluid overload state and probably related to poor diet.  Continues to have frequent episodes of ascites and aspiration.  Today there is no clinical evidence of acute decompensated heart failure after dialysis x2.  I also reviewed the echocardiogram with the patient.  No significant change overall, pericardial effusion is very mild.  Suspect hypotension was probably related to A. fib with RVR and also may be related to RV failure with decreased cardiac output.  Fortunately he has remained stable and blood pressure has been stable.  He wants to go home today, I do not see any contraindication from being discharged however I fear that he probably will return back to the emergency room soon.  I have discussed with him regarding compliance with medications.  He does have an appointment to see me on 02/07/2020 and I encouraged him to keep this.  I have resumed his amiodarone.    Adrian Prows, MD, [facility] 02/05/2020, 6:59 PM Office: [phone]

## 2020-02-05 NOTE — Plan of Care (Signed)
Problem: Education: Goal: Knowledge of General Education information will improve Description: Including pain rating scale, medication(s)/side effects and non-pharmacologic comfort measures 02/05/2020 0823 by Shanon Ace, RN Outcome: Progressing 02/04/2020 1911 by Shanon Ace, RN Outcome: Progressing 02/04/2020 1911 by Shanon Ace, RN Outcome: Progressing   Problem: Health Behavior/Discharge Planning: Goal: Ability to manage health-related needs will improve 02/05/2020 0823 by Shanon Ace, RN Outcome: Progressing 02/04/2020 1911 by Shanon Ace, RN Outcome: Progressing 02/04/2020 1911 by Shanon Ace, RN Outcome: Progressing   Problem: Clinical Measurements: Goal: Ability to maintain clinical measurements within normal limits will improve 02/05/2020 0823 by Shanon Ace, RN Outcome: Progressing 02/04/2020 1911 by Shanon Ace, RN Outcome: Progressing 02/04/2020 1911 by Shanon Ace, RN Outcome: Progressing Goal: Will remain free from infection 02/05/2020 0823 by Shanon Ace, RN Outcome: Progressing 02/04/2020 1911 by Shanon Ace, RN Outcome: Progressing 02/04/2020 1911 by Shanon Ace, RN Outcome: Progressing Goal: Diagnostic test results will improve 02/05/2020 0823 by Shanon Ace, RN Outcome: Progressing 02/04/2020 1911 by Shanon Ace, RN Outcome: Progressing 02/04/2020 1911 by Shanon Ace, RN Outcome: Progressing Goal: Respiratory complications will improve 02/05/2020 0823 by Shanon Ace, RN Outcome: Progressing 02/04/2020 1911 by Shanon Ace, RN Outcome: Progressing 02/04/2020 1911 by Shanon Ace, RN Outcome: Progressing Goal: Cardiovascular complication will be avoided 02/05/2020 0823 by Shanon Ace, RN Outcome: Progressing 02/04/2020 1911 by Shanon Ace, RN Outcome: Progressing 02/04/2020 1911 by Shanon Ace, RN Outcome: Progressing   Problem: Activity: Goal: Risk for activity intolerance will decrease 02/05/2020 0823 by Shanon Ace,  RN Outcome: Progressing 02/04/2020 1911 by Shanon Ace, RN Outcome: Progressing 02/04/2020 1911 by Shanon Ace, RN Outcome: Progressing   Problem: Nutrition: Goal: Adequate nutrition will be maintained 02/05/2020 0823 by Shanon Ace, RN Outcome: Progressing 02/04/2020 1911 by Shanon Ace, RN Outcome: Progressing 02/04/2020 1911 by Shanon Ace, RN Outcome: Progressing   Problem: Coping: Goal: Level of anxiety will decrease 02/05/2020 0823 by Shanon Ace, RN Outcome: Progressing 02/04/2020 1911 by Shanon Ace, RN Outcome: Progressing 02/04/2020 1911 by Shanon Ace, RN Outcome: Progressing   Problem: Elimination: Goal: Will not experience complications related to bowel motility 02/05/2020 0823 by Shanon Ace, RN Outcome: Progressing 02/04/2020 1911 by Shanon Ace, RN Outcome: Progressing 02/04/2020 1911 by Shanon Ace, RN Outcome: Progressing Goal: Will not experience complications related to urinary retention 02/05/2020 0823 by Shanon Ace, RN Outcome: Progressing 02/04/2020 1911 by Shanon Ace, RN Outcome: Progressing 02/04/2020 1911 by Shanon Ace, RN Outcome: Progressing   Problem: Pain Managment: Goal: General experience of comfort will improve 02/05/2020 0823 by Shanon Ace, RN Outcome: Progressing 02/04/2020 1911 by Shanon Ace, RN Outcome: Progressing 02/04/2020 1911 by Shanon Ace, RN Outcome: Progressing   Problem: Safety: Goal: Ability to remain free from injury will improve 02/05/2020 0823 by Shanon Ace, RN Outcome: Progressing 02/04/2020 1911 by Shanon Ace, RN Outcome: Progressing 02/04/2020 1911 by Shanon Ace, RN Outcome: Progressing   Problem: Skin Integrity: Goal: Risk for impaired skin integrity will decrease 02/05/2020 0823 by Shanon Ace, RN Outcome: Progressing 02/04/2020 1911 by Shanon Ace, RN Outcome: Progressing 02/04/2020 1911 by Shanon Ace, RN Outcome: Progressing   Problem: Education: Goal:  Knowledge of disease and its progression will improve 02/05/2020 0823 by Shanon Ace, RN Outcome: Progressing 02/04/2020 1911 by Shanon Ace, RN Outcome: Progressing 02/04/2020 1911 by Shanon Ace, RN Outcome: Progressing Goal: Individualized Educational Video(s) 02/05/2020 0823 by Shanon Ace, RN Outcome: Progressing 02/04/2020 1911 by Shanon Ace, RN Outcome: Progressing 02/04/2020 1911 by Shanon Ace, RN Outcome: Progressing   Problem: Fluid Volume: Goal: Compliance with  measures to maintain balanced fluid volume will improve 02/05/2020 0823 by Shanon Ace, RN Outcome: Progressing 02/04/2020 1911 by Shanon Ace, RN Outcome: Progressing 02/04/2020 1911 by Shanon Ace, RN Outcome: Progressing   Problem: Health Behavior/Discharge Planning: Goal: Ability to manage health-related needs will improve 02/05/2020 0823 by Shanon Ace, RN Outcome: Progressing 02/04/2020 1911 by Shanon Ace, RN Outcome: Progressing 02/04/2020 1911 by Shanon Ace, RN Outcome: Progressing   Problem: Nutritional: Goal: Ability to make healthy dietary choices will improve 02/05/2020 0823 by Shanon Ace, RN Outcome: Progressing 02/04/2020 1911 by Shanon Ace, RN Outcome: Progressing 02/04/2020 1911 by Shanon Ace, RN Outcome: Progressing   Problem: Clinical Measurements: Goal: Complications related to the disease process, condition or treatment will be avoided or minimized 02/05/2020 0823 by Shanon Ace, RN Outcome: Progressing 02/04/2020 1911 by Shanon Ace, RN Outcome: Progressing 02/04/2020 1911 by Shanon Ace, RN Outcome: Progressing   Problem: Education: Goal: Ability to state activities that reduce stress will improve 02/05/2020 0823 by Shanon Ace, RN Outcome: Progressing 02/04/2020 1911 by Shanon Ace, RN Outcome: Progressing 02/04/2020 1911 by Shanon Ace, RN Outcome: Progressing   Problem: Coping: Goal: Ability to identify and develop effective coping  behavior will improve 02/05/2020 0823 by Shanon Ace, RN Outcome: Progressing 02/04/2020 1911 by Shanon Ace, RN Outcome: Progressing 02/04/2020 1911 by Shanon Ace, RN Outcome: Progressing   Problem: Self-Concept: Goal: Ability to identify factors that promote anxiety will improve 02/05/2020 0823 by Shanon Ace, RN Outcome: Progressing 02/04/2020 1911 by Shanon Ace, RN Outcome: Progressing 02/04/2020 1911 by Shanon Ace, RN Outcome: Progressing Goal: Level of anxiety will decrease 02/05/2020 0823 by Shanon Ace, RN Outcome: Progressing 02/04/2020 1911 by Shanon Ace, RN Outcome: Progressing 02/04/2020 1911 by Shanon Ace, RN Outcome: Progressing Goal: Ability to modify response to factors that promote anxiety will improve 02/05/2020 0823 by Shanon Ace, RN Outcome: Progressing 02/04/2020 1911 by Shanon Ace, RN Outcome: Progressing 02/04/2020 1911 by Shanon Ace, RN Outcome: Progressing

## 2020-02-05 NOTE — Discharge Summary (Signed)
Discharge Summary  [name] OZD:[id number] DOB: 1961-02-17  PCP: [name], FNP  Admit date: 02/03/2020 Discharge date: 02/05/2020  Time spent: 40 mins  Recommendations for Outpatient Follow-up:  1. Signed AMA  Discharge Diagnoses:  Active Hospital Problems   Diagnosis Date Noted  . Atrial fibrillation with RVR ([place]) 05/29/2019  . ESRD (end stage renal disease) ([employer])   . COPD (chronic obstructive pulmonary disease) ([place]) 04/23/2017    Resolved Hospital Problems   Diagnosis Date Noted Date Resolved  . Acute respiratory failure with hypoxia ([place]) 06/18/2017 02/04/2020    Discharge Condition:   Diet recommendation:   Vitals:   02/05/20 1630 02/05/20 1710  BP:  (!) 100/62  Pulse: 104 104  Resp:  18  Temp:  98.4 F (36.9 C)  SpO2:  98%    History of present illness:  Joshua Favia Hoskinsis a 59 y.o.malewith medical history significant ofESRD on dialysis MWF, COPD, prior EtOH abuse (quit in 2017), A.Fib, cirrhosis, chronic pain syndrome. Pt was recently admitted for COPD exacerbation, just discharged on 7/20 on PO prednisone, hasnt finished the course of this yet (still has 1 day left).Pt last at dialysis on Fri. Pt presents to the ED with c/o SOB and CP. Located in substernal area, waxing and waning. Nothing makes better or worse. Symptoms are mild. In the ED, pt in A.Fib RVR with rates as high as 140 initially, came down to 100-120 on its own during ED stay. CXR with mild pulm vasc congestion. BP running low (25-956 systolic).  Patient admitted for further management.   Today after HD, patient signed [facility] Course:  Principal Problem:   Atrial fibrillation with RVR ([place]) Active Problems:   COPD (chronic obstructive pulmonary disease) (H[facility])   ESRD (end stage renal disease) (H[facility])   Paroxysmal A. fib with RVR Was on Cardizem drip, started without bolus due to hypotension Not a candidate for anticoagulation due to liver  cirrhosis/alcohol abuse EDP spoke with [facility] cardiology, follows with Dr. Einar Gip, has an appointment scheduled for 02/07/2020  Hypotension Baseline BP normally soft, around SBP 100s Asymptomatic, afebrile with leukocytosis (on steroids) BC x2 pending Chest x-ray showed no focal consolidation Monitor closely  ESRD/hyperkalemia Management by nephrology  COPD Stable, currently saturating well on room air Chest x-ray showed mild vascular congestion, no focal consolidation Xopenex nebs as needed, start Dulera, complete prednisone Supplemental oxygen as needed  Chronic decompensated liver cirrhosis/history of hepatitis B,C/chronic alcohol use Last paracentesis done on 7/15, next scheduled on February 07 2020 Follows with radiology for periodic paracentesis Tenofovir  Chronic pain syndrome Continue home pain regimen with OxyIR 3 times daily as needed, gabapentin  Tobacco abuse Refused nicotine patch           Malnutrition Type:      Malnutrition Characteristics:      Nutrition Interventions:      Estimated body mass index is 23.51 kg/m as calculated from the following:   Height as of this encounter: 5\' 9"  (1.753 m).   Weight as of this encounter: 72.2 kg.    Procedures:  NOne  Consultations:  Cardiology  Nephrology  Discharge Exam: BP (!) 100/62 (BP Location: Left Arm)   Pulse 104   Temp 98.4 F (36.9 C) (Oral)   Resp 18   Ht 5\' 9"  (1.753 m)   Wt 72.2 kg   SpO2 98%   BMI 23.51 kg/m   General: Left AMA Cardiovascular:  Respiratory:   Discharge Instructions You were cared  for by a hospitalist during your hospital stay. If you have any questions about your discharge medications or the care you received while you were in the hospital after you are discharged, you can call the unit and asked to speak with the hospitalist on call if the hospitalist that took care of you is not available. Once you are discharged, your primary care physician  will handle any further medical issues. Please note that NO REFILLS for any discharge medications will be authorized once you are discharged, as it is imperative that you return to your primary care physician (or establish a relationship with a primary care physician if you do not have one) for your aftercare needs so that they can reassess your need for medications and monitor your lab values.    Allergies  Allergen Reactions  . Tramadol Itching and Other (See Comments)  . Morphine And Related Other (See Comments)    Stomach pain  . Pollen Extract Other (See Comments)    Other reaction(s): Sneezing (finding)  . Acetaminophen Nausea Only    Stomach ache   . Aspirin Itching and Other (See Comments)    STOMACH PAIN   . Clonidine Derivatives Itching    Follow-up Information    Adrian Prows, MD Follow up on 02/07/2020.   Specialty: Cardiology Why: 1 PM appointment Contact information: [place] [address] [phone]                The results of significant diagnostics from this hospitalization (including imaging, microbiology, ancillary and laboratory) are listed below for reference.    Significant Diagnostic Studies: DG Chest 2 View  Result Date: 02/03/2020 CLINICAL DATA:  59 year old male with chest pain and shortness of breath. EXAM: CHEST - 2 VIEW COMPARISON:  Chest radiograph dated 01/25/2020. FINDINGS: There is mild eventration of the left hemidiaphragm with left lung base atelectasis. There is no focal consolidation, pleural effusion, or pneumothorax. Stable cardiomegaly with probable mild vascular congestion. No edema. Atherosclerotic calcification of the aorta. No acute osseous pathology. IMPRESSION: Cardiomegaly with probable mild vascular congestion. No focal consolidation. Electronically Signed   By: Anner Crete M.D.   On: 02/03/2020 20:37   DG Chest 2 View  Result Date: 01/20/2020 CLINICAL DATA:  Burning sensation in chest. EXAM:  CHEST - 2 VIEW COMPARISON:  January 19, 2020 FINDINGS: Stable cardiomegaly. Hila and mediastinum are normal. Mild haziness in the periphery of the lungs bilaterally. Mild atelectasis in the left base. No other acute abnormalities. IMPRESSION: Mild haziness in the peripheries of the lungs bilaterally may represent overlapping soft tissues or subtle developing infiltrates. Recommend clinical correlation and close attention on follow-up. Electronically Signed   By: Dorise Bullion III M.D   On: 01/20/2020 16:23   DG Chest 2 View  Result Date: 01/19/2020 CLINICAL DATA:  Shortness of breath, productive cough. Dialysis yesterday. EXAM: CHEST - 2 VIEW COMPARISON:  Chest x-rays dated 01/14/2020 and 01/13/2020. Chest CT angiogram dated 01/14/2020. FINDINGS: Stable cardiomegaly. Lungs are clear. No pleural effusion or pneumothorax is seen. IMPRESSION: 1. Lungs are clear. No evidence of pneumonia or pulmonary edema on today's exam. 2. Stable cardiomegaly. Electronically Signed   By: Franki Cabot M.D.   On: 01/19/2020 08:22   CT Angio Chest PE W and/or Wo Contrast  Result Date: 01/14/2020 CLINICAL DATA:  Shortness of breath. Soft dialysis prematurely secondary to itching. Recent hospital discharge. EXAM: CT ANGIOGRAPHY CHEST WITH CONTRAST TECHNIQUE: Multidetector CT imaging of the chest was performed  using the standard protocol during bolus administration of intravenous contrast. Multiplanar CT image reconstructions and MIPs were obtained to evaluate the vascular anatomy. CONTRAST:  54mL OMNIPAQUE IOHEXOL 350 MG/ML SOLN COMPARISON:  CT chest 12/23/2019 FINDINGS: Cardiovascular: Satisfactory opacification the pulmonary arteries to the segmental level. No pulmonary artery filling defects are identified. Central pulmonary arteries are normal caliber. Suboptimal opacification of the aorta for luminal evaluation. Atherosclerotic plaque within the normal caliber aorta. Normal 3 vessel branching of the aortic arch. Extensive  atheromatous plaque throughout the proximal great vessels as included. Mild cardiomegaly with predominantly biatrial enlargement. Dense calcifications noted upon the coronary arteries as well as upon the mitral annulus and aortic leaflets. Small pericardial effusion. Distension of the azygos. Reflux of contrast into the IVC and hepatic veins. No other major venous abnormalities on this arterial phase imaging. Mediastinum/Nodes: Edematous changes the mediastinum. Normal thyroid gland and thoracic inlet. No acute abnormality of the trachea or esophagus. No worrisome mediastinal, hilar or axillary adenopathy. Lungs/Pleura: Small bilateral pleural effusions with adjacent areas of passive atelectasis. Septal and fissural thickening is present with vascular redistribution. Some dependent areas of ground-glass opacity favoring a combination of passive and dependent atelectasis. No worrisome consolidative opacity. No pneumothorax. Stable biapical pleuroparenchymal scarring. No worrisome nodules or masses. Upper Abdomen: Upper abdominal ascites. No other acute upper abdominal abnormality is visible. Musculoskeletal: Diffuse body wall edema. Osseous manifestations of renal osteodystrophy. No worrisome bone or chest wall lesions. Review of the MIP images confirms the above findings. IMPRESSION: 1. No evidence of acute pulmonary artery filling defects to suggest pulmonary embolism. 2. Cardiomegaly with predominantly biatrial enlargement. Small pericardial effusion. 3. Reflux of contrast into the IVC and hepatic veins, can be seen with right heart failure/fluid overload. 4. Small bilateral pleural effusions with adjacent areas of passive atelectasis. 5. Fissural and septal thickening with vascular redistribution likely to reflect some interstitial edema. 6. Diffuse body wall edema, upper abdominal ascites, and body wall edema, consistent with anasarca. 7. Osseous manifestations of renal osteodystrophy. 8. Aortic Atherosclerosis  (ICD10-I70.0). Electronically Signed   By: Lovena Le M.D.   On: 01/14/2020 21:00   DG Chest Port 1 View  Result Date: 01/25/2020 CLINICAL DATA:  Shortness of breath, end-stage renal disease, hepatitis, cirrhosis EXAM: PORTABLE CHEST 1 VIEW COMPARISON:  01/20/2020 FINDINGS: Single frontal view of the chest demonstrates persistent enlargement of the cardiac silhouette. Diffuse increased interstitial prominence since prior study likely reflects interstitial edema. No airspace disease, effusion, or pneumothorax. No acute bony abnormalities. IMPRESSION: 1. Increased interstitial prominence compatible with interstitial edema. Electronically Signed   By: Randa Ngo M.D.   On: 01/25/2020 22:52   DG Chest Portable 1 View  Result Date: 01/14/2020 CLINICAL DATA:  Shortness of breath. EXAM: PORTABLE CHEST 1 VIEW COMPARISON:  January 13, 2020 FINDINGS: Mild, diffuse, chronic appearing increased lung markings are seen. There is decreased vascular congestion since the prior study. Mild, hazy areas of atelectasis and/or early infiltrate are seen along the periphery of the mid left lung. There is no evidence of a pleural effusion or pneumothorax. The cardiac silhouette is markedly enlarged. There is mild calcification of the aortic arch. The visualized skeletal structures are unremarkable. IMPRESSION: 1. Mild, hazy areas of atelectasis and/or early infiltrate along the periphery of the mid left lung. 2. Decreased pulmonary vascular congestion when compared to the prior exam. Electronically Signed   By: Virgina Norfolk M.D.   On: 01/14/2020 18:29   DG Chest Portable 1 View  Result Date: 01/13/2020 CLINICAL  DATA:  59 year old male with shortness of breath. EXAM: PORTABLE CHEST 1 VIEW COMPARISON:  Chest radiograph dated 12/21/2019 and CT dated 12/23/2019 FINDINGS: There is moderate enlargement of the cardiopericardial silhouette similar to the radiograph of 12/21/2019. There is central vascular prominence consistent with  congestion. Trace bilateral pleural effusions noted. There are bibasilar atelectatic changes. No lobar consolidation or pneumothorax. Atherosclerotic calcification of the aorta and branches of the aortic arch. No acute osseous pathology. IMPRESSION: 1. Cardiomegaly with vascular congestion and possible mild edema. Overall slight interval worsening of the congestion since the radiograph of 12/21/2019. 2. Advanced atherosclerotic calcification of the aorta and branches of the aortic arch. Electronically Signed   By: Anner Crete M.D.   On: 01/13/2020 16:32   [place] ASCITES (ABDOMEN LIMITED)  Result Date: 01/14/2020 CLINICAL DATA:  Assess ascites. Multiple previous paracenteses due to cirrhosis. EXAM: LIMITED ABDOMEN ULTRASOUND FOR ASCITES TECHNIQUE: Limited ultrasound survey for ascites was performed in all four abdominal quadrants. COMPARISON:  12/22/2019 FINDINGS: Examination demonstrates mild-to-moderate amount of ascites left greater than right. Ascites over the right abdomen is improved compared to the images from the previous exam. IMPRESSION: Mild-to-moderate ascites left greater than right. Electronically Signed   By: Marin Olp M.D.   On: 01/14/2020 21:52   ECHOCARDIOGRAM LIMITED  Result Date: 02/05/2020    ECHOCARDIOGRAM LIMITED REPORT   Patient Name:   ROSTON GRUNEWALD Date of Exam: 02/05/2020 Medical Rec #:  [id number]              Height:       69.0 in Accession #:    [id number]             Weight:       155.0 lb Date of Birth:  [birth year]               BSA:          1.854 m Patient Age:    60 years               BP:           101/74 mmHg Patient Gender: M                      HR:           77 bpm. Exam Location:  Inpatient Procedure: 2D Echo Indications:    Shortness of breath  History:        Patient has prior history of Echocardiogram examinations.  Sonographer:    Raquel Sarna Senior Referring Phys: Jet  1. Left ventricular ejection fraction, by estimation, is 50 to 55%.  The left ventricle has normal function. There is severe left ventricular hypertrophy. Left ventricular diastolic function could not be evaluated. I cannot exclude septal hypokinesis. Endocardium not well visualized. septal motion also is consistent with LBBB.  2. Right ventricular systolic function is mildly reduced. The right ventricular size is moderately enlarged. Severely increased right ventricular wall thickness. There is moderately elevated pulmonary artery systolic pressure. The estimated right ventricular systolic pressure is 37.1 mmHg.  3. Left atrial size was severely dilated.  4. Right atrial size was severely dilated.  5. The mitral valve is normal in structure. Mild mitral valve regurgitation.  6. Dilated TV annulus. Tricuspid valve regurgitation is moderate to severe.  7. AV sclerosis without stenosis. The aortic valve is tricuspid. Aortic valve regurgitation is trivial. Mild aortic valve sclerosis is present, with no evidence of aortic valve stenosis.  Aortic valve mean gradient measures 8.3 mmHg.  8. The inferior vena cava is dilated in size with <50% respiratory variability, suggesting right atrial pressure of 15 mmHg. FINDINGS  Left Ventricle: Left ventricular ejection fraction, by estimation, is 50 to 55%. The left ventricle has normal function. There is severe left ventricular hypertrophy. Abnormal (paradoxical) septal motion, consistent with left bundle branch block.  LV Wall Scoring: I cannot exclude septal hypokinesis. Endocardium not well visualized. septal motion also is consistent with LBBB. Right Ventricle: The right ventricular size is moderately enlarged. Severely increased right ventricular wall thickness. Right ventricular systolic function is mildly reduced. There is moderately elevated pulmonary artery systolic pressure. The tricuspid  regurgitant velocity is 3.34 m/s, and with an assumed right atrial pressure of 15 mmHg, the estimated right ventricular systolic pressure is 37.4  mmHg. Left Atrium: Left atrial size was severely dilated. Right Atrium: Right atrial size was severely dilated. Pericardium: Trivial pericardial effusion is present. Mitral Valve: The mitral valve is normal in structure. Mild mitral valve regurgitation. MV peak gradient, 20.1 mmHg. The mean mitral valve gradient is 6.0 mmHg. Tricuspid Valve: Dilated TV annulus. The tricuspid valve is normal in structure. Tricuspid valve regurgitation is moderate to severe. Aortic Valve: AV sclerosis without stenosis. The aortic valve is tricuspid. Aortic valve regurgitation is trivial. Mild aortic valve sclerosis is present, with no evidence of aortic valve stenosis. There is mild calcification of the aortic valve. Aortic valve mean gradient measures 8.3 mmHg. Aortic valve peak gradient measures 14.0 mmHg. Aortic valve area, by VTI measures 1.59 cm. Pulmonic Valve: The pulmonic valve was normal in structure. Pulmonic valve regurgitation is trivial. Aorta: The aortic root is normal in size and structure. Pulmonary Artery: The pulmonary artery is not well seen. Venous: The inferior vena cava is dilated in size with less than 50% respiratory variability, suggesting right atrial pressure of 15 mmHg. IAS/Shunts: No atrial level shunt detected by color flow Doppler. Additional Comments: There is no pleural effusion. LEFT VENTRICLE PLAX 2D LVOT diam:     2.00 cm LV SV:         62 LV SV Index:   33 LVOT Area:     3.14 cm  AORTIC VALVE AV Area (Vmax):    1.46 cm AV Area (Vmean):   1.60 cm AV Area (VTI):     1.59 cm AV Vmax:           187.00 cm/s AV Vmean:          132.000 cm/s AV VTI:            0.389 m AV Peak Grad:      14.0 mmHg AV Mean Grad:      8.3 mmHg LVOT Vmax:         86.80 cm/s LVOT Vmean:        67.100 cm/s LVOT VTI:          0.197 m LVOT/AV VTI ratio: 0.51 MITRAL VALVE              TRICUSPID VALVE MV Area (PHT): 1.22 cm   TR Peak grad:   44.6 mmHg MV Peak grad:  20.1 mmHg  TR Vmax:        334.00 cm/s MV Mean grad:  6.0  mmHg MV Vmax:       2.24 m/s   SHUNTS MV Vmean:      103.0 cm/s Systemic VTI:  0.20 m  Systemic Diam: 2.00 cm Adrian Prows MD Electronically signed by Adrian Prows MD Signature Date/Time: 02/05/2020/10:08:07 AM    Final    IR Paracentesis  Result Date: 01/31/2020 INDICATION: Patient with cirrhosis, recurrent ascites. Request made for therapeutic paracentesis. EXAM: ULTRASOUND GUIDED THERAPEUTIC PARACENTESIS MEDICATIONS: 10 ML 1% LIDOCAINE COMPLICATIONS: None immediate. PROCEDURE: Informed written consent was obtained from the patient after a discussion of the risks, benefits and alternatives to treatment. A timeout was performed prior to the initiation of the procedure. Initial ultrasound scanning demonstrates a small amount of ascites within the left lateral abdomen. The left lateral abdomen was prepped and draped in the usual sterile fashion. 1% lidocaine was used for local anesthesia. Following this, a 6 Fr Safe-T-Centesis catheter was introduced. An ultrasound image was saved for documentation purposes. The paracentesis was performed. The catheter was removed and a dressing was applied. The patient tolerated the procedure well without immediate post procedural complication. FINDINGS: A total of approximately 5.0 liters of yellow fluid was removed. Samples were sent to the laboratory as requested by the clinical team. IMPRESSION: Successful ultrasound-guided therapeutic paracentesis yielding 5.0 liters of peritoneal fluid. Read by: Brynda Greathouse PA-C Electronically Signed   By: Corrie Mckusick D.O.   On: 01/31/2020 14:45   IR Paracentesis  Result Date: 01/24/2020 INDICATION: End-stage renal disease, cirrhosis, hepatitis B and C, recurrent ascites. Request for therapeutic paracentesis EXAM: ULTRASOUND GUIDED PARACENTESIS MEDICATIONS: 1% lidocaine 15 mL COMPLICATIONS: None immediate. PROCEDURE: Informed written consent was obtained from the patient after a discussion of the risks, benefits  and alternatives to treatment. A timeout was performed prior to the initiation of the procedure. Initial ultrasound scanning demonstrates a moderate amount of ascites within the left lower abdominal quadrant. The left lower abdomen was prepped and draped in the usual sterile fashion. 1% lidocaine was used for local anesthesia. Following this, a 6 Fr Safe-T-Centesis catheter was introduced. An ultrasound image was saved for documentation purposes. The paracentesis was performed. The catheter was removed and a dressing was applied. The patient tolerated the procedure well without immediate post procedural complication. FINDINGS: A total of approximately 1.4 L of amber fluid was removed. IMPRESSION: Successful ultrasound-guided paracentesis yielding 1.4 liters of peritoneal fluid. Read by: Gareth Eagle, PA-C Electronically Signed   By: Aletta Edouard M.D.   On: 01/24/2020 14:49   IR Paracentesis  Result Date: 01/17/2020 INDICATION: Patient with history end-stage renal disease, cirrhosis, hepatitis B/C, recurrent ascites ;request made for therapeutic paracentesis EXAM: ULTRASOUND GUIDED THERAPEUTIC  PARACENTESIS MEDICATIONS: None COMPLICATIONS: None immediate. PROCEDURE: Informed written consent was obtained from the patient after a discussion of the risks, benefits and alternatives to treatment. A timeout was performed prior to the initiation of the procedure. Initial ultrasound scanning demonstrates a moderate amount of ascites within the left lower abdominal quadrant. The left lower abdomen was prepped and draped in the usual sterile fashion. 1% lidocaine was used for local anesthesia. Following this, a 6 Fr Safe-T-Centesis catheter was introduced. An ultrasound image was saved for documentation purposes. The paracentesis was performed. The catheter was removed and a dressing was applied. The patient tolerated the procedure well without immediate post procedural complication. FINDINGS: A total of approximately 3.2  liters of hazy,amber fluid was removed. IMPRESSION: Successful ultrasound-guided therapeutic paracentesis yielding 3.2 liters of peritoneal fluid. Read by: Lindaann Pascal Electronically Signed   By: Markus Daft M.D.   On: 01/17/2020 13:42   IR Paracentesis  Result Date: 01/10/2020 INDICATION: Patient with a history of cirrhosis and recurrent  ascites. Interventional radiology asked to perform a therapeutic paracentesis. EXAM: ULTRASOUND GUIDED PARACENTESIS MEDICATIONS: 1% lidocaine 20 mL COMPLICATIONS: None immediate. PROCEDURE: Informed written consent was obtained from the patient after a discussion of the risks, benefits and alternatives to treatment. A timeout was performed prior to the initiation of the procedure. Initial ultrasound scanning demonstrates a large amount of ascites within the right lower abdominal quadrant. The right lower abdomen was prepped and draped in the usual sterile fashion. 1% lidocaine was used for local anesthesia. Following this, a 19 gauge, 7-cm, Yueh catheter was introduced. A strong fluid return was obtained but when attached to suction the flow stopped. Multiple attempts at manipulating the catheter were made without success. Ultrasound imaging continued to demonstrate a large amount of ascites in the right lower quadrant. The catheter was removed and a second 19 gauge, 7-cm Yueh was inserted with the same results. The catheter was removed and a dressing was applied. Ultrasound scanning demonstrated a large amount of ascites within the left lower abdominal quadrant. The left lower abdomen was prepped and draped in the usual sterile fashion. 1% lidocaine was used for local anesthesia. A 19 gauge, 7-cm Teressa Lower was inserted with a strong fluid return but when attached to suction the flow stopped. Multiple attempts at manipulating the catheter were made without success. The catheter was removed. A 6 French Safe-T-Centesis was then inserted with expected results. An ultrasound image  was saved for documentation purposes. The paracentesis was performed. The catheter was removed and a dressing was applied. The patient tolerated the procedure well without immediate post procedural complication. FINDINGS: A total of approximately 3 L of light pink fluid was removed. IMPRESSION: Successful ultrasound-guided paracentesis yielding 3 liters of peritoneal fluid. Read by: Soyla Dryer, NP Electronically Signed   By: Markus Daft M.D.   On: 01/10/2020 17:15    Microbiology: Recent Results (from the past 240 hour(s))  MRSA PCR Screening     Status: None   Collection Time: 01/27/20  6:37 AM   Specimen: Nasopharyngeal  Result Value Ref Range Status   MRSA by PCR NEGATIVE NEGATIVE Final    Comment:        The GeneXpert MRSA Assay (FDA approved for NASAL specimens only), is one component of a comprehensive MRSA colonization surveillance program. It is not intended to diagnose MRSA infection nor to guide or monitor treatment for MRSA infections. Performed at [facility], [place] [address] [place], [place] [address]   SARS Coronavirus 2 by RT PCR (hospital order, performed in [facility] hospital lab) Nasopharyngeal Nasopharyngeal Swab     Status: None   Collection Time: 02/03/20  9:25 PM   Specimen: Nasopharyngeal Swab  Result Value Ref Range Status   SARS Coronavirus 2 NEGATIVE NEGATIVE Final    Comment: (NOTE) SARS-CoV-2 target nucleic acids are NOT DETECTED.  The SARS-CoV-2 RNA is generally detectable in upper and lower respiratory specimens during the acute phase of infection. The lowest concentration of SARS-CoV-2 viral copies this assay can detect is 250 copies / mL. A negative result does not preclude SARS-CoV-2 infection and should not be used as the sole basis for treatment or other patient management decisions.  A negative result may occur with improper specimen collection / handling, submission of specimen other than nasopharyngeal swab, presence of viral  mutation(s) within the areas targeted by this assay, and inadequate number of viral copies (<250 copies / mL). A negative result must be combined with clinical observations, patient history, and epidemiological  information.  Fact Sheet for Patients:   StrictlyIdeas.no  Fact Sheet for Healthcare Providers: BankingDealers.co.za  This test is not yet approved or  cleared by the [place] FDA and has been authorized for detection and/or diagnosis of SARS-CoV-2 by FDA under an Emergency Use Authorization (EUA).  This EUA will remain in effect (meaning this test can be used) for the duration of the COVID-19 declaration under Section 564(b)(1) of the Act, 21 U.S.C. section 360bbb-3(b)(1), unless the authorization is terminated or revoked sooner.  Performed at [facility], [place] [address], [place], [place] [address]   Culture, blood (routine x 2)     Status: None (Preliminary result)   Collection Time: 02/04/20  9:10 AM   Specimen: BLOOD LEFT WRIST  Result Value Ref Range Status   Specimen Description BLOOD LEFT WRIST  Final   Special Requests   Final    BOTTLES DRAWN AEROBIC AND ANAEROBIC Blood Culture adequate volume   Culture   Final    NO GROWTH 1 DAY Performed at [facility], [place] [address], [place], [place] [address]    Report Status PENDING  Incomplete  Culture, blood (routine x 2)     Status: None (Preliminary result)   Collection Time: 02/04/20  9:30 AM   Specimen: BLOOD RIGHT HAND  Result Value Ref Range Status   Specimen Description BLOOD RIGHT HAND  Final   Special Requests   Final    BOTTLES DRAWN AEROBIC AND ANAEROBIC Blood Culture results may not be optimal due to an inadequate volume of blood received in culture bottles   Culture   Final    NO GROWTH 1 DAY Performed at [facility], [place] [address], [place], [place] [address]    Report Status PENDING  Incomplete     Labs: Basic Metabolic  Panel: Recent Labs  Lab 02/03/20 2013 02/04/20 0313 02/05/20 0255  NA 132* 134* 134*  K 6.1* 6.1* 5.5*  CL 87* 87* 93*  CO2 28 29 28   GLUCOSE 229* 113* 102*  BUN 89* 94* 50*  CREATININE 6.23* 6.63* 4.27*  CALCIUM 10.0 10.2 9.6  PHOS  --   --  4.6   Liver Function Tests: Recent Labs  Lab 02/03/20 2013 02/05/20 0255  AST 93*  --   ALT 151*  --   ALKPHOS 271*  --   BILITOT 1.3*  --   PROT 6.5  --   ALBUMIN 2.8* 2.7*   No results for input(s): LIPASE, AMYLASE in the last 168 hours. No results for input(s): AMMONIA in the last 168 hours. CBC: Recent Labs  Lab 02/03/20 2013 02/04/20 0313 02/05/20 0255  WBC 17.2* 18.6* 14.3*  NEUTROABS  --   --  11.6*  HGB 11.9* 11.8* 10.7*  HCT 34.4* 33.3* 30.9*  MCV 79.6* 79.5* 78.8*  PLT 187 185 178   Cardiac Enzymes: No results for input(s): CKTOTAL, CKMB, CKMBINDEX, TROPONINI in the last 168 hours. BNP: BNP (last 3 results) Recent Labs    10/06/19 1159 11/12/19 2341 01/14/20 1927  BNP 2,690.1* 3,672.9* 2,254.1*    ProBNP (last 3 results) No results for input(s): PROBNP in the last 8760 hours.  CBG: Recent Labs  Lab 02/03/20 2241 02/05/20 0721  GLUCAP 134* 89       Signed:  Alma Friendly, MD [facility] 02/05/2020, 9:58 PM

## 2020-02-05 NOTE — Progress Notes (Signed)
Around 1815 pt was upset we did not have his discharge paperwork and he wanted to leave, he had things to do.  I advised that the computers were down, and that I needed to get a hold of his medical dr. To have her discharge him.  He advised me that his Cardiologist told him he could leave and he was leaving, also that Dr. Posey Pronto told him he could leave and he was leaving.   I told him, that at this time I did not have orders for him to leave.  He said he was going because he was told he could go home.  He removed himself from the Cardizem drip and came out into the hallway dressed in paper scrubs and informed me that he was leaving with out being discharged because Dr. Posey Pronto told him he could leave.  It was again explained to him that the computers were down, to our knowledge there were not discharge orders, and if he were to leave AMA - insurance would not pay for this hospital stay.  He told myself and Meka EL, DD, that he understood this, and he was sorry but had things to do before it got dark tonight and had to leave. He signed the [facility] papers and left.

## 2020-02-07 ENCOUNTER — Other Ambulatory Visit: Payer: Self-pay | Admitting: Cardiology

## 2020-02-07 ENCOUNTER — Other Ambulatory Visit: Payer: Self-pay

## 2020-02-07 ENCOUNTER — Encounter: Payer: Self-pay | Admitting: Cardiology

## 2020-02-07 ENCOUNTER — Ambulatory Visit: Payer: Medicare Other | Admitting: Cardiology

## 2020-02-07 ENCOUNTER — Ambulatory Visit (HOSPITAL_COMMUNITY)
Admission: RE | Admit: 2020-02-07 | Discharge: 2020-02-07 | Disposition: A | Discharge status: Home / Self Care | Payer: Medicare Other | Source: Ambulatory Visit | Attending: Gastroenterology | Admitting: Gastroenterology

## 2020-02-07 VITALS — BP 94/53 | HR 88 | Resp 16 | Ht 69.0 in | Wt 157.0 lb

## 2020-02-07 DIAGNOSIS — I4891 Unspecified atrial fibrillation: Secondary | ICD-10-CM | POA: Diagnosis not present

## 2020-02-07 DIAGNOSIS — Z992 Dependence on renal dialysis: Secondary | ICD-10-CM

## 2020-02-07 DIAGNOSIS — R070 Pain in throat: Secondary | ICD-10-CM

## 2020-02-07 DIAGNOSIS — R188 Other ascites: Secondary | ICD-10-CM | POA: Insufficient documentation

## 2020-02-07 DIAGNOSIS — K7460 Unspecified cirrhosis of liver: Secondary | ICD-10-CM | POA: Insufficient documentation

## 2020-02-07 DIAGNOSIS — I480 Paroxysmal atrial fibrillation: Secondary | ICD-10-CM

## 2020-02-07 DIAGNOSIS — N186 End stage renal disease: Secondary | ICD-10-CM

## 2020-02-07 DIAGNOSIS — I2781 Cor pulmonale (chronic): Secondary | ICD-10-CM

## 2020-02-07 DIAGNOSIS — J189 Pneumonia, unspecified organism: Secondary | ICD-10-CM | POA: Diagnosis not present

## 2020-02-07 HISTORY — PX: IR PARACENTESIS: IMG2679

## 2020-02-07 MED ORDER — LIDOCAINE HCL (PF) 1 % IJ SOLN
INTRAMUSCULAR | Status: AC | PRN
  Administered 2020-02-07: 10 mL

## 2020-02-07 MED ORDER — MAGIC MOUTHWASH W/LIDOCAINE: 10.0000 mL | Freq: Three times a day (TID) | ORAL | 0 refills | Status: DC

## 2020-02-07 MED ORDER — LIDOCAINE HCL 1 % IJ SOLN
INTRAMUSCULAR | Status: AC
  Filled 2020-02-07: qty 20

## 2020-02-07 NOTE — Progress Notes (Signed)
Primary Physician/Referring:  [name], FNP  Patient ID: [name], male    DOB: 1960-08-07, 59 y.o.   MRN: [id number]  Chief Complaint  Patient presents with  . Follow-up    6 week  . Atrial Fibrillation   HPI:    [name]  is a 59 y.o. male  with very complex medical problems including severe COPD and chronic cor pulmonale with pulmonary hypertension, coronary artery disease s/p stenting to mid RCA in 2018, paroxysmal atrial fibrillation with RVR, hypertensive cardiomyopathy, dietary and medication noncompliance, end-stage renal disease on hemodialysis, chronic hepatitis B and C, tobacco use, smokes 1/2 ppd, h/o marijuana use and prior cocaine use quit in 2017. Not on anticoagulation due to cirrhosis of liver and compliance with Warfarin.   He has had multiple ER visits for A fib with RVR, near syncope, shortness of breath, acute diastolic heart failure and hyperkalemia.  He has also been in the hospital with acute respiratory distress and pulmonary edema due to missed dialysis.  I last saw him in the inpatient setting on 02/05/2020 when he presented also with low blood pressure associated with A. fib with RVR and fluid overload state.  Again he had discontinued all his medications.  They were reinitiated.  After rate control, dialysis x2 for hyperkalemia, he was discharged home he now presents for follow-up.    States that he has been taking his amiodarone and all the medications as prescribed although he did not bring them here with him today.  He had peritoneal centesis with 1 L of fluid drained today.  He also complains of throat pain and requests some pain medication.  Past Medical History:  Diagnosis Date  . Anemia   . Anxiety   . Arthritis    left shoulder  . Atherosclerosis of aorta ([place])   . Cardiomegaly   . Chest pain    DATE UNKNOWN, C/O PERIODICALLY  . Cocaine abuse ([place])   . COPD exacerbation ([place]) 08/17/2016  . Coronary artery disease      stent 02/22/17  . ESRD (end stage renal disease) on dialysis ([place])    "[name]; MWF" (07/04/2017)  . GERD (gastroesophageal reflux disease)    DATE UNKNOWN  . Hemorrhoids   . Hepatitis B, chronic ([place])   . Hepatitis C   . History of kidney stones   . Hyperkalemia   . Hypertension   . Kidney failure   . Metabolic bone disease    Patient denies  . Mitral stenosis   . Myocardial infarction ([place])   . Pneumonia   . Pulmonary edema   . Solitary rectal ulcer syndrome 07/2017   at flex sig for rectal bleeding  . Tubular adenoma of colon    Past Surgical History:  Procedure Laterality Date  . A/V FISTULAGRAM Left 05/26/2017   Procedure: A/V FISTULAGRAM;  Surgeon: Conrad , MD;  Location: [place];  Service: Cardiovascular;  Laterality: Left;  . A/V FISTULAGRAM Right 11/18/2017   Procedure: A/V FISTULAGRAM - Right Arm;  Surgeon: Elam Dutch, MD;  Location: [place] [facility];  Service: Cardiovascular;  Laterality: Right;  . APPLICATION OF WOUND VAC Left 06/14/2017   Procedure: APPLICATION OF WOUND VAC;  Surgeon: Katha Cabal, MD;  Location: [facility];  Service: Vascular;  Laterality: Left;  . AV FISTULA PLACEMENT  2012   BELIEVED WAS PLACED IN JUNE  . AV FISTULA PLACEMENT Right 08/09/2017   Procedure: Creation Right arm  ARTERIOVENOUS BRACHIOCEPOHALIC FISTULA;  Surgeon: Elam Dutch, MD;  Location: [facility];  Service: Vascular;  Laterality: Right;  . AV FISTULA PLACEMENT Right 11/22/2017   Procedure: INSERTION OF ARTERIOVENOUS (AV) GORE-TEX GRAFT RIGHT UPPER ARM;  Surgeon: Elam Dutch, MD;  Location: [place];  Service: Vascular;  Laterality: Right;  . BIOPSY  01/25/2018   Procedure: BIOPSY;  Surgeon: Jerene Bears, MD;  Location: [place];  Service: Gastroenterology;;  . BIOPSY  04/10/2019   Procedure: BIOPSY;  Surgeon: Jerene Bears, MD;  Location: [facility];  Service: Gastroenterology;;  . COLONOSCOPY    . COLONOSCOPY WITH PROPOFOL N/A 01/25/2018    Procedure: COLONOSCOPY WITH PROPOFOL;  Surgeon: Jerene Bears, MD;  Location: [place];  Service: Gastroenterology;  Laterality: N/A;  . CORONARY STENT INTERVENTION N/A 02/22/2017   Procedure: CORONARY STENT INTERVENTION;  Surgeon: Nigel Mormon, MD;  Location: [place] [facility];  Service: Cardiovascular;  Laterality: N/A;  . ESOPHAGOGASTRODUODENOSCOPY (EGD) WITH PROPOFOL N/A 01/25/2018   Procedure: ESOPHAGOGASTRODUODENOSCOPY (EGD) WITH PROPOFOL;  Surgeon: Jerene Bears, MD;  Location: [place];  Service: Gastroenterology;  Laterality: N/A;  . ESOPHAGOGASTRODUODENOSCOPY (EGD) WITH PROPOFOL N/A 04/10/2019   Procedure: ESOPHAGOGASTRODUODENOSCOPY (EGD) WITH PROPOFOL;  Surgeon: Jerene Bears, MD;  Location: [facility];  Service: Gastroenterology;  Laterality: N/A;  . FLEXIBLE SIGMOIDOSCOPY N/A 07/15/2017   Procedure: FLEXIBLE SIGMOIDOSCOPY;  Surgeon: Carol Ada, MD;  Location: [employer];  Service: Endoscopy;  Laterality: N/A;  . HEMORRHOID BANDING    . I & D EXTREMITY Left 06/01/2017   Procedure: IRRIGATION AND DEBRIDEMENT LEFT ARM HEMATOMA WITH LIGATION OF LEFT ARM AV FISTULA;  Surgeon: Elam Dutch, MD;  Location: [place];  Service: Vascular;  Laterality: Left;  . I & D EXTREMITY Left 06/14/2017   Procedure: IRRIGATION AND DEBRIDEMENT EXTREMITY;  Surgeon: Katha Cabal, MD;  Location: [facility];  Service: Vascular;  Laterality: Left;  . INSERTION OF DIALYSIS CATHETER  05/30/2017  . INSERTION OF DIALYSIS CATHETER N/A 05/30/2017   Procedure: INSERTION OF DIALYSIS CATHETER;  Surgeon: Elam Dutch, MD;  Location: [place];  Service: Vascular;  Laterality: N/A;  . IR PARACENTESIS  08/30/2017  . IR PARACENTESIS  09/29/2017  . IR PARACENTESIS  10/28/2017  . IR PARACENTESIS  11/09/2017  . IR PARACENTESIS  11/16/2017  . IR PARACENTESIS  11/28/2017  . IR PARACENTESIS  12/01/2017  . IR PARACENTESIS  12/06/2017  . IR PARACENTESIS  01/03/2018  . IR PARACENTESIS  01/23/2018  . IR  PARACENTESIS  02/07/2018  . IR PARACENTESIS  02/21/2018  . IR PARACENTESIS  03/06/2018  . IR PARACENTESIS  03/17/2018  . IR PARACENTESIS  04/04/2018  . IR PARACENTESIS  12/28/2018  . IR PARACENTESIS  01/08/2019  . IR PARACENTESIS  01/23/2019  . IR PARACENTESIS  02/01/2019  . IR PARACENTESIS  02/19/2019  . IR PARACENTESIS  03/01/2019  . IR PARACENTESIS  03/15/2019  . IR PARACENTESIS  04/03/2019  . IR PARACENTESIS  04/12/2019  . IR PARACENTESIS  05/01/2019  . IR PARACENTESIS  05/08/2019  . IR PARACENTESIS  05/24/2019  . IR PARACENTESIS  06/12/2019  . IR PARACENTESIS  07/09/2019  . IR PARACENTESIS  07/27/2019  . IR PARACENTESIS  08/09/2019  . IR PARACENTESIS  08/21/2019  . IR PARACENTESIS  09/17/2019  . IR PARACENTESIS  10/05/2019  . IR PARACENTESIS  10/29/2019  . IR PARACENTESIS  11/08/2019  . IR PARACENTESIS  12/12/2019  . IR PARACENTESIS  01/03/2020  . IR  PARACENTESIS  01/10/2020  . IR PARACENTESIS  01/17/2020  . IR PARACENTESIS  01/24/2020  . IR PARACENTESIS  01/31/2020  . IR RADIOLOGIST EVAL & MGMT  02/14/2018  . IR RADIOLOGIST EVAL & MGMT  02/22/2019  . LEFT HEART CATH AND CORONARY ANGIOGRAPHY N/A 02/22/2017   Procedure: LEFT HEART CATH AND CORONARY ANGIOGRAPHY;  Surgeon: Nigel Mormon, MD;  Location: [place] [facility];  Service: Cardiovascular;  Laterality: N/A;  . LIGATION OF ARTERIOVENOUS  FISTULA Left 07/19/15   Procedure: Plication of Left Arm Arteriovenous Fistula;  Surgeon: Elam Dutch, MD;  Location: [place];  Service: Vascular;  Laterality: Left;  . POLYPECTOMY    . POLYPECTOMY  01/25/2018   Procedure: POLYPECTOMY;  Surgeon: Jerene Bears, MD;  Location: [place];  Service: Gastroenterology;;  . REVISON OF ARTERIOVENOUS FISTULA Left 4/94/4967   Procedure: PLICATION OF DISTAL ANEURYSMAL SEGEMENT OF LEFT UPPER ARM ARTERIOVENOUS FISTULA;  Surgeon: Elam Dutch, MD;  Location: [facility];  Service: Vascular;  Laterality: Left;  . REVISON OF ARTERIOVENOUS FISTULA Left 5/91/6384   Procedure:  Plication of Left Upper Arm Fistula ;  Surgeon: Waynetta Sandy, MD;  Location: [place];  Service: Vascular;  Laterality: Left;  . SKIN GRAFT SPLIT THICKNESS LEG / FOOT Left    SKIN GRAFT SPLIT THICKNESS LEFT ARM DONOR SITE: LEFT ANTERIOR THIGH  . SKIN SPLIT GRAFT Left 07/04/2017   Procedure: SKIN GRAFT SPLIT THICKNESS LEFT ARM DONOR SITE: LEFT ANTERIOR THIGH;  Surgeon: Elam Dutch, MD;  Location: [place];  Service: Vascular;  Laterality: Left;  . THROMBECTOMY W/ EMBOLECTOMY Left 06/05/2017   Procedure: EXPLORATION OF LEFT ARM FOR BLEEDING; OVERSEWED PROXIMAL FISTULA;  Surgeon: Angelia Mould, MD;  Location: [place];  Service: Vascular;  Laterality: Left;  . WOUND EXPLORATION Left 06/03/2017   Procedure: WOUND EXPLORATION WITH WOUND VAC APPLICATION TO LEFT ARM;  Surgeon: Angelia Mould, MD;  Location[facility];  Service: Vascular;  Laterality: Left;   Family History  Problem Relation Age of Onset  . Heart disease Mother   . Lung cancer Mother   . Heart disease Father   . Malignant hyperthermia Father   . COPD Father   . Throat cancer Sister   . Esophageal cancer Sister   . Hypertension Other   . COPD Other   . Colon cancer Neg Hx   . Colon polyps Neg Hx   . Rectal cancer Neg Hx   . Stomach cancer Neg Hx     Social History   Tobacco Use  . Smoking status: Current Every Day Smoker    Packs/day: 0.50    Years: 43.00    Pack years: 21.50    Types: Cigarettes    Start date: 08/13/1973  . Smokeless tobacco: Never Used  Substance Use Topics  . Alcohol use: Not Currently    Comment: quit drinking in 2017   Marital Status: Single   ROS  Review of Systems  HENT: Positive for sore throat.   Cardiovascular: Positive for dyspnea on exertion (chronic) and palpitations. Negative for leg swelling and syncope.  Respiratory: Positive for wheezing. Negative for hemoptysis.   Endocrine: Negative for cold intolerance.  Hematologic/Lymphatic: Does not bruise/bleed  easily.  Gastrointestinal: Negative for abdominal pain, hematochezia and melena.   Objective  Blood pressure (!) 94/53, pulse 88, resp. rate 16, height 5\' 9"  (1.753 m), weight 157 lb (71.2 kg), SpO2 98 %.  Vitals with BMI 02/07/2020 02/07/2020 02/05/2020  Height - 5'  9" -  Weight - 157 lbs 159 lbs 3 oz  BMI - 58.52 77.82  Systolic 94 84 423  Diastolic 53 56 62  Pulse 88 67 104  Some encounter information is confidential and restricted. Go to Review Flowsheets activity to see all data.     Physical Exam Vitals reviewed.  Constitutional:      General: He is not in acute distress.    Appearance: He is cachectic. He is ill-appearing.  Cardiovascular:     Rate and Rhythm: Normal rate and regular rhythm.     Heart sounds: Murmur heard.  Early systolic murmur is present with a grade of 2/6 at the upper right sternal border.  Low-pitched rumbling crescendo presystolic murmur is present with a grade of 3/4 at the apex.      Comments: S1 normal and S2 loud. No murmur. No JVD Right arm AV graft noted Pulmonary:     Effort: Pulmonary effort is normal. No accessory muscle usage or respiratory distress.     Breath sounds: Normal breath sounds.  Abdominal:     General: Bowel sounds are normal. There is distension.     Palpations: Abdomen is soft.     Comments: Ascites present  Musculoskeletal:        General: Normal range of motion.    Laboratory examination:   Recent Labs    02/03/20 2013 02/04/20 0313 02/05/20 0255  NA 132* 134* 134*  K 6.1* 6.1* 5.5*  CL 87* 87* 93*  CO2 28 29 28   GLUCOSE 229* 113* 102*  BUN 89* 94* 50*  CREATININE 6.23* 6.63* 4.27*  CALCIUM 10.0 10.2 9.6  GFRNONAA 9* 8* 14*  GFRAA 11* 10* 17*   estimated creatinine clearance is 19.1 mL/min (A) (by C-G formula based on SCr of 4.27 mg/dL (H)).  CMP Latest Ref Rng & Units 02/05/2020 02/04/2020 02/03/2020  Glucose 70 - 99 mg/dL 102(H) 113(H) 229(H)  BUN 6 - 20 mg/dL 50(H) 94(H) 89(H)  Creatinine 0.61 - 1.24  mg/dL 4.27(H) 6.63(H) 6.23(H)  Sodium 135 - 145 mmol/L 134(L) 134(L) 132(L)  Potassium 3.5 - 5.1 mmol/L 5.5(H) 6.1(H) 6.1(H)  Chloride 98 - 111 mmol/L 93(L) 87(L) 87(L)  CO2 22 - 32 mmol/L 28 29 28   Calcium 8.9 - 10.3 mg/dL 9.6 10.2 10.0  Total Protein 6.5 - 8.1 g/dL - - 6.5  Total Bilirubin 0.3 - 1.2 mg/dL - - 1.3(H)  Alkaline Phos 38 - 126 U/L - - 271(H)  AST 15 - 41 U/L - - 93(H)  ALT 0 - 44 U/L - - 151(H)   CBC Latest Ref Rng & Units 02/05/2020 02/04/2020 02/03/2020  WBC 4.0 - 10.5 K/uL 14.3(H) 18.6(H) 17.2(H)  Hemoglobin 13.0 - 17.0 g/dL 10.7(L) 11.8(L) 11.9(L)  Hematocrit 39 - 52 % 30.9(L) 33.3(L) 34.4(L)  Platelets 150 - 400 K/uL 178 185 187   Lipid Panel     Component Value Date/Time   CHOL 103 08/17/2016 0134   TRIG 183 (H) 10/04/2019 1400   HDL 34 (L) 08/17/2016 0134   CHOLHDL 3.0 08/17/2016 0134   VLDL 19 08/17/2016 0134   LDLCALC 50 08/17/2016 0134   HEMOGLOBIN A1C Lab Results  Component Value Date   HGBA1C 4.8 01/26/2020   MPG 91.06 01/26/2020   TSH No results for input(s): TSH in the last 8760 hours.  Lipid Panel Recent Labs    10/04/19 1400  TRIG 183*    External labs:   03/11/2018: TSH 1.32 normal  Medications and allergies  Allergies  Allergen Reactions  . Tramadol Itching and Other (See Comments)  . Morphine And Related Other (See Comments)    Stomach pain  . Pollen Extract Other (See Comments)    Other reaction(s): Sneezing (finding)  . Acetaminophen Nausea Only    Stomach ache   . Aspirin Itching and Other (See Comments)    STOMACH PAIN   . Clonidine Derivatives Itching     Current Outpatient Medications  Medication Instructions  . albuterol (VENTOLIN HFA) 108 (90 Base) MCG/ACT inhaler 2 puffs, Inhalation, Every 4 hours PRN  . ALLERGY 10 MG tablet Patient unaware of medication name.  [place] Kitchen amiodarone (PACERONE) 200 mg, Oral, Daily  . diltiazem (CARDIZEM CD) 240 mg, Oral, Daily  . diphenhydrAMINE (BENADRYL) 25 mg, Oral, Every 6  hours PRN  . gabapentin (NEURONTIN) 300 mg, Oral, 2 times daily  . magic mouthwash w/lidocaine SOLN 10 mLs, Oral, 3 times daily  . metoprolol tartrate (LOPRESSOR) 12.5 mg, Oral, 2 times daily  . Oxycodone HCl 10 mg, Oral, 3 times daily PRN  . sevelamer carbonate (RENVELA) 2,400 mg, Oral, See admin instructions, Take 3 tablets (2400 mg) by mouth up to three times daily with meals  . tenofovir (VIREAD) 300 mg, Oral, Every Mon   Radiology:    Cardiac Studies:   Coronary Angiogram 02/22/2017: No significant disease on left, mid RCA high-grade stenosis status post 3.5 x 23 mm Xience [place] DES.  Carotid Doppler 10/28/2016: Stenosis in the right common carotid artery (<50%) with severe heteregenous plaque. Stenosis in the left internal carotid artery (16-49%). Severe heteregenous plaque in the left common carotid artery with < 50% stenosis. Mild stenosis in the left external carotid artery (<50%). Antegrade vertebral artery flow. Follow up in one year is appropriate if clinically indicated.  Lower Venous DVT Study 10/05/2019: BILATERAL: - No evidence of deep vein thrombosis seen in the lower extremities, bilaterally.  RIGHT & LEFT: - No cystic structure found in the popliteal fossa.   Echocardiogram 10/04/2019: 1. Low normal LV systolic function; severe LVH (myocardium with specked appearance; consider amyloid); mild to moderate AS (mean gradient 11 mmHg  and AVA 1.3 cm2); mild AI; thickened MV with mild to moderate MS (mean gradient 6 mmHg and MVA 1.2-2 cm2;  pt in atrial fibrillation at time of study); severe biatrial enlargement; mild RVE; severe RV dysfunction; severe TR with severe pulmonary hypertension.  2. Left ventricular ejection fraction, by estimation, is 50 to 55%. The left ventricle has low normal function. The left ventricle has no regional  wall motion abnormalities. There is severe left ventricular hypertrophy.  Left ventricular diastolic function could not be evaluated.  3.  Right ventricular systolic function is severely reduced. The right ventricular size is mildly enlarged. There is severely elevated pulmonary artery systolic pressure.  4. Left atrial size was severely dilated.  5. Right atrial size was severely dilated.  6. Large pleural effusion in the left lateral region.  7. The mitral valve is normal in structure. Mild mitral valve regurgitation. Mild to moderate mitral stenosis.  8. Tricuspid valve regurgitation is severe.  9. The aortic valve is normal in structure. Aortic valve regurgitation is mild. Mild to moderate aortic valve stenosis.  10. The inferior vena cava is dilated in size with <50% respiratory variability, suggesting right atrial pressure of 15 mmHg.  EKG  EKG 02/07/2020: Atrial fibrillation with rapid ventricular response, ventricular rate 111 bpm, left bundle branch block.  No further analysis.  EKG 12/27/2019: Sinus rhythm with first-degree  AV block at rate of 77 bpm, rare PACs, left bundle branch block.  No further analysis.  EKG 12/03/2019: Underlying possibly atypical atrial flutter with 2: 1 conduction, ventricular rate 92 bpm, left bundle branch block.  No further analysis.  PVC.    08/01/2019: Atrial fibrillation at 79 bpm, LBBB. No further analysis due to LBBB.  Assessment     ICD-10-CM   1. Paroxysmal atrial fibrillation (HCC)  I48.0 EKG 12-Lead  2. Throat pain in adult  R07.0 magic mouthwash w/lidocaine SOLN  3. Cor pulmonale, chronic (HCC)  I27.81   4. CKD (chronic kidney disease) stage V requiring chronic dialysis (HCC)  N18.6    Z99.2    .   Meds ordered this encounter  Medications  . magic mouthwash w/lidocaine SOLN    Sig: Take 10 mLs by mouth 3 (three) times daily for 2 days.    Dispense:  60 mL    Refill:  0    There are no discontinued medications.  Recommendations:   [name]  is a 59 y.o. male  with very complex medical problems including severe COPD and chronic cor pulmonale with pulmonary  hypertension, coronary artery disease s/p stenting to mid RCA in 2018, paroxysmal atrial fibrillation with RVR, hypertensive cardiomyopathy, dietary and medication noncompliance, end-stage renal disease on hemodialysis, chronic hepatitis B and C, tobacco use, smokes 1/2 ppd, h/o marijuana use and prior cocaine use quit in 2017. Not on anticoagulation due to cirrhosis of liver and compliance with Warfarin.   He has had multiple ER visits for A fib with RVR, syncope, shortness of breath and hyperkalemia.  He has also been in the hospital with acute respiratory distress and pulmonary edema due to missed dialysis.   His recent evaluation by me was on 02/05/2020 for inpatient admission.  He is presently taking all his medications, no changes were done by me.  Complains of throat ache, examination of the throat does not reveal any acute abnormality.  Magic mouthwash prescription sent.  I will see him back in 3 weeks for follow-up.   Adrian Prows, MD, [facility] 02/07/2020, 3:19 PM Office: [phone]

## 2020-02-07 NOTE — Procedures (Signed)
PROCEDURE SUMMARY:  Successful US guided paracentesis from left lateral abdomen.  Yielded 780 mL of clear yellow fluid.  No immediate complications.  Patient tolerated well.  EBL = trace  Yassin Scales S Colby Reels PA-C 02/07/2020 1:59 PM

## 2020-02-08 ENCOUNTER — Encounter (HOSPITAL_COMMUNITY): Payer: Self-pay | Admitting: Emergency Medicine

## 2020-02-08 ENCOUNTER — Emergency Department (HOSPITAL_COMMUNITY)
Admission: EM | Admit: 2020-02-08 | Discharge: 2020-02-09 | Disposition: A | Discharge status: Home / Self Care | Payer: Medicare Other | Source: Home / Self Care | Attending: Emergency Medicine | Admitting: Emergency Medicine

## 2020-02-08 ENCOUNTER — Emergency Department (HOSPITAL_COMMUNITY)
Admission: EM | Admit: 2020-02-08 | Discharge: 2020-02-08 | Disposition: A | Discharge status: Home / Self Care | Payer: Medicare Other | Source: Home / Self Care

## 2020-02-08 ENCOUNTER — Emergency Department (HOSPITAL_COMMUNITY): Payer: Medicare Other

## 2020-02-08 ENCOUNTER — Other Ambulatory Visit: Payer: Self-pay

## 2020-02-08 DIAGNOSIS — Z992 Dependence on renal dialysis: Secondary | ICD-10-CM | POA: Insufficient documentation

## 2020-02-08 DIAGNOSIS — Z85038 Personal history of other malignant neoplasm of large intestine: Secondary | ICD-10-CM | POA: Insufficient documentation

## 2020-02-08 DIAGNOSIS — F17210 Nicotine dependence, cigarettes, uncomplicated: Secondary | ICD-10-CM | POA: Insufficient documentation

## 2020-02-08 DIAGNOSIS — I120 Hypertensive chronic kidney disease with stage 5 chronic kidney disease or end stage renal disease: Secondary | ICD-10-CM | POA: Insufficient documentation

## 2020-02-08 DIAGNOSIS — J441 Chronic obstructive pulmonary disease with (acute) exacerbation: Secondary | ICD-10-CM | POA: Insufficient documentation

## 2020-02-08 DIAGNOSIS — N186 End stage renal disease: Secondary | ICD-10-CM | POA: Insufficient documentation

## 2020-02-08 DIAGNOSIS — I2510 Atherosclerotic heart disease of native coronary artery without angina pectoris: Secondary | ICD-10-CM | POA: Insufficient documentation

## 2020-02-08 DIAGNOSIS — Z955 Presence of coronary angioplasty implant and graft: Secondary | ICD-10-CM | POA: Insufficient documentation

## 2020-02-08 DIAGNOSIS — K922 Gastrointestinal hemorrhage, unspecified: Secondary | ICD-10-CM | POA: Insufficient documentation

## 2020-02-08 DIAGNOSIS — Z86010 Personal history of colonic polyps: Secondary | ICD-10-CM | POA: Insufficient documentation

## 2020-02-08 DIAGNOSIS — I4891 Unspecified atrial fibrillation: Secondary | ICD-10-CM | POA: Insufficient documentation

## 2020-02-08 DIAGNOSIS — Z79899 Other long term (current) drug therapy: Secondary | ICD-10-CM | POA: Insufficient documentation

## 2020-02-08 DIAGNOSIS — Z5321 Procedure and treatment not carried out due to patient leaving prior to being seen by health care provider: Secondary | ICD-10-CM | POA: Insufficient documentation

## 2020-02-08 DIAGNOSIS — R42 Dizziness and giddiness: Secondary | ICD-10-CM | POA: Insufficient documentation

## 2020-02-08 DIAGNOSIS — I252 Old myocardial infarction: Secondary | ICD-10-CM | POA: Insufficient documentation

## 2020-02-08 LAB — CBC
HCT: 31.5 % — ABNORMAL LOW (ref 39.0–52.0)
Hemoglobin: 11.1 g/dL — ABNORMAL LOW (ref 13.0–17.0)
MCH: 28.3 pg (ref 26.0–34.0)
MCHC: 35.2 g/dL (ref 30.0–36.0)
MCV: 80.4 fL (ref 80.0–100.0)
Platelets: 186 10*3/uL (ref 150–400)
RBC: 3.92 MIL/uL — ABNORMAL LOW (ref 4.22–5.81)
RDW: 17.4 % — ABNORMAL HIGH (ref 11.5–15.5)
WBC: 16.9 10*3/uL — ABNORMAL HIGH (ref 4.0–10.5)
nRBC: 0 % (ref 0.0–0.2)

## 2020-02-08 LAB — TYPE AND SCREEN
ABO/RH(D): O NEG
Antibody Screen: NEGATIVE

## 2020-02-08 LAB — CBC WITH DIFFERENTIAL/PLATELET
Abs Immature Granulocytes: 0.2 10*3/uL — ABNORMAL HIGH (ref 0.00–0.07)
Basophils Absolute: 0 10*3/uL (ref 0.0–0.1)
Basophils Relative: 0 %
Eosinophils Absolute: 0.1 10*3/uL (ref 0.0–0.5)
Eosinophils Relative: 1 %
HCT: 32.8 % — ABNORMAL LOW (ref 39.0–52.0)
Hemoglobin: 11.3 g/dL — ABNORMAL LOW (ref 13.0–17.0)
Immature Granulocytes: 1 %
Lymphocytes Relative: 7 %
Lymphs Abs: 1.1 10*3/uL (ref 0.7–4.0)
MCH: 27.6 pg (ref 26.0–34.0)
MCHC: 34.5 g/dL (ref 30.0–36.0)
MCV: 80 fL (ref 80.0–100.0)
Monocytes Absolute: 1.3 10*3/uL — ABNORMAL HIGH (ref 0.1–1.0)
Monocytes Relative: 9 %
Neutro Abs: 12.2 10*3/uL — ABNORMAL HIGH (ref 1.7–7.7)
Neutrophils Relative %: 82 %
Platelets: 173 10*3/uL (ref 150–400)
RBC: 4.1 MIL/uL — ABNORMAL LOW (ref 4.22–5.81)
RDW: 17.3 % — ABNORMAL HIGH (ref 11.5–15.5)
WBC: 15 10*3/uL — ABNORMAL HIGH (ref 4.0–10.5)
nRBC: 0 % (ref 0.0–0.2)

## 2020-02-08 LAB — COMPREHENSIVE METABOLIC PANEL
ALT: 137 U/L — ABNORMAL HIGH (ref 0–44)
ALT: 122 U/L — ABNORMAL HIGH (ref 0–44)
AST: 76 U/L — ABNORMAL HIGH (ref 15–41)
AST: 65 U/L — ABNORMAL HIGH (ref 15–41)
Albumin: 2.9 g/dL — ABNORMAL LOW (ref 3.5–5.0)
Albumin: 2.9 g/dL — ABNORMAL LOW (ref 3.5–5.0)
Alkaline Phosphatase: 243 U/L — ABNORMAL HIGH (ref 38–126)
Alkaline Phosphatase: 250 U/L — ABNORMAL HIGH (ref 38–126)
Anion gap: 15 (ref 5–15)
Anion gap: 11 (ref 5–15)
BUN: 71 mg/dL — ABNORMAL HIGH (ref 6–20)
BUN: 38 mg/dL — ABNORMAL HIGH (ref 6–20)
CO2: 26 mmol/L (ref 22–32)
CO2: 31 mmol/L (ref 22–32)
Calcium: 9.6 mg/dL (ref 8.9–10.3)
Calcium: 9.1 mg/dL (ref 8.9–10.3)
Chloride: 91 mmol/L — ABNORMAL LOW (ref 98–111)
Chloride: 89 mmol/L — ABNORMAL LOW (ref 98–111)
Creatinine, Ser: 4.41 mg/dL — ABNORMAL HIGH (ref 0.61–1.24)
Creatinine, Ser: 3.03 mg/dL — ABNORMAL HIGH (ref 0.61–1.24)
GFR calc Af Amer: 16 mL/min — ABNORMAL LOW (ref >60)
GFR calc Af Amer: 25 mL/min — ABNORMAL LOW (ref >60)
GFR calc non Af Amer: 14 mL/min — ABNORMAL LOW (ref >60)
GFR calc non Af Amer: 22 mL/min — ABNORMAL LOW (ref >60)
Glucose, Bld: 129 mg/dL — ABNORMAL HIGH (ref 70–99)
Glucose, Bld: 105 mg/dL — ABNORMAL HIGH (ref 70–99)
Potassium: 5.8 mmol/L — ABNORMAL HIGH (ref 3.5–5.1)
Potassium: 5.3 mmol/L — ABNORMAL HIGH (ref 3.5–5.1)
Sodium: 132 mmol/L — ABNORMAL LOW (ref 135–145)
Sodium: 131 mmol/L — ABNORMAL LOW (ref 135–145)
Total Bilirubin: 1.3 mg/dL — ABNORMAL HIGH (ref 0.3–1.2)
Total Bilirubin: 1.4 mg/dL — ABNORMAL HIGH (ref 0.3–1.2)
Total Protein: 6.3 g/dL — ABNORMAL LOW (ref 6.5–8.1)
Total Protein: 6.6 g/dL (ref 6.5–8.1)

## 2020-02-08 LAB — I-STAT CHEM 8, ED
BUN: 46 mg/dL — ABNORMAL HIGH (ref 6–20)
Calcium, Ion: 1 mmol/L — ABNORMAL LOW (ref 1.15–1.40)
Chloride: 90 mmol/L — ABNORMAL LOW (ref 98–111)
Creatinine, Ser: 3 mg/dL — ABNORMAL HIGH (ref 0.61–1.24)
Glucose, Bld: 105 mg/dL — ABNORMAL HIGH (ref 70–99)
HCT: 38 % — ABNORMAL LOW (ref 39.0–52.0)
Hemoglobin: 12.9 g/dL — ABNORMAL LOW (ref 13.0–17.0)
Potassium: 5.2 mmol/L — ABNORMAL HIGH (ref 3.5–5.1)
Sodium: 133 mmol/L — ABNORMAL LOW (ref 135–145)
TCO2: 31 mmol/L (ref 22–32)

## 2020-02-08 LAB — PROTIME-INR
INR: 1.1 (ref 0.8–1.2)
Prothrombin Time: 13.3 seconds (ref 11.4–15.2)

## 2020-02-08 MED ORDER — DILTIAZEM LOAD VIA INFUSION
15.0000 mg | Freq: Once | INTRAVENOUS | Status: DC
  Filled 2020-02-08: qty 15

## 2020-02-08 MED ORDER — LACTATED RINGERS IV BOLUS
1000.0000 mL | Freq: Once | INTRAVENOUS | Status: AC
  Administered 2020-02-08: 1000 mL via INTRAVENOUS

## 2020-02-08 MED ORDER — ONDANSETRON HCL 4 MG/2ML IJ SOLN
INTRAMUSCULAR | Status: AC
  Administered 2020-02-08: 4 mg
  Filled 2020-02-08: qty 2

## 2020-02-08 MED ORDER — DILTIAZEM HCL 30 MG PO TABS
30.0000 mg | ORAL_TABLET | Freq: Once | ORAL | Status: AC
  Administered 2020-02-08: 30 mg via ORAL
  Filled 2020-02-08: qty 1

## 2020-02-08 MED ORDER — SODIUM ZIRCONIUM CYCLOSILICATE 10 G PO PACK
10.0000 g | PACK | Freq: Once | ORAL | Status: AC
  Administered 2020-02-08: 10 g via ORAL
  Filled 2020-02-08: qty 1

## 2020-02-08 MED ORDER — DILTIAZEM HCL-DEXTROSE 125-5 MG/125ML-% IV SOLN (PREMIX)
5.0000 mg/h | INTRAVENOUS | Status: DC
  Filled 2020-02-08: qty 125

## 2020-02-08 MED ORDER — MORPHINE SULFATE 15 MG PO TABS
30.0000 mg | ORAL_TABLET | Freq: Once | ORAL | Status: AC
  Administered 2020-02-08: 30 mg via ORAL
  Filled 2020-02-08: qty 2

## 2020-02-08 NOTE — ED Notes (Signed)
Pt visualized leaving.

## 2020-02-08 NOTE — ED Provider Notes (Addendum)
[name] EMERGENCY DEPARTMENT Provider Note   CSN: [id number] Arrival date & time:        History Chief Complaint  Patient presents with  . V [name] is a 59 y.o. male.  HPI     59 year old male comes in a chief complaint of shortness of breath. Patient has history of A. Fib, ESRD on HD, with last session earlier today, liver cirrhosis.  Patient reports that he suddenly started having shortness of breath and dizziness.  EMS arrived and noted that patient was having wide-complex tachycardia.  His blood pressure was slightly low.  In route to the ER he received 150 mg amiodarone without any relief.  Patient is not complaining of any chest pain but is having some headache and dizziness.  His last dialysis session was earlier today.  He denies any drug use, cigarette smoking.  He has been taking his medications as prescribed.  Past Medical History:  Diagnosis Date  . Anemia   . Anxiety   . Arthritis    left shoulder  . Atherosclerosis of aorta ([place])   . Cardiomegaly   . Chest pain    DATE UNKNOWN, C/O PERIODICALLY  . Cocaine abuse ([place])   . COPD exacerbation ([place]) 08/17/2016  . Coronary artery disease    stent 02/22/17  . ESRD (end stage renal disease) on dialysis ([place])    "[name]; MWF" (07/04/2017)  . GERD (gastroesophageal reflux disease)    DATE UNKNOWN  . Hemorrhoids   . Hepatitis B, chronic ([place])   . Hepatitis C   . History of kidney stones   . Hyperkalemia   . Hypertension   . Kidney failure   . Metabolic bone disease    Patient denies  . Mitral stenosis   . Myocardial infarction ([place])   . Pneumonia   . Pulmonary edema   . Solitary rectal ulcer syndrome 07/2017   at flex sig for rectal bleeding  . Tubular adenoma of colon     Patient Active Problem List   Diagnosis Date Noted  . Leukocytosis 01/19/2020  . Tobacco use 01/19/2020  . Acute pulmonary edema ([place]) 12/21/2019  . Acetaminophen overdose  10/27/2019  . ESRD (end stage renal disease) ([place])   . Goals of care, counseling/discussion   . Hypoxia 10/04/2019  . Advanced care planning/counseling discussion   . Renal failure 09/26/2019  . Dyspnea 06/09/2019  . Acquired thrombophilia ([place]) 06/05/2019  . A-fib ([place]) 05/30/2019  . Atrial fibrillation with RVR ([place]) 05/29/2019  . Melena   . Pressure injury of skin 03/09/2019  . Abdominal distention   . Volume overload 12/28/2018  . Sepsis ([place]) 09/12/2018  . Coronary artery disease involving native coronary artery of native heart without angina pectoris 03/11/2018  . Benign neoplasm of cecum   . Benign neoplasm of ascending colon   . Benign neoplasm of descending colon   . Benign neoplasm of rectum   . AF (paroxysmal atrial fibrillation) ([place]) 01/23/2018  . Hx of colonic polyps 01/20/2018  . End-stage renal disease on hemodialysis ([place]) 11/21/2017  . GERD (gastroesophageal reflux disease) 11/16/2017  . Decompensated hepatic cirrhosis ([place]) 11/15/2017  . Palliative care by specialist   . Hyponatremia 11/04/2017  . SBP (spontaneous bacterial peritonitis) ([place]) 10/30/2017  . Liver disease, chronic 10/30/2017  . SOB (shortness of breath)   . Abdominal pain 10/28/2017  . Upper airway cough syndrome with flattening on f/v loop 10/13/17 c/w vcd 10/17/2017  .  Elevated diaphragm 10/13/2017  . Ileus ([place]) 09/29/2017  . QT prolongation 09/29/2017  . Malnutrition of moderate degree 09/29/2017  . Sinus congestion 09/03/2017  . Symptomatic anemia 09/02/2017  . Cirrhosis of liver with ascites ([place]) 09/02/2017  . Left bundle branch block 09/02/2017  . Mitral stenosis 09/02/2017  . Hematochezia 07/15/2017  . Wide-complex tachycardia ([place])   . Endotracheally intubated   . ESRD on dialysis ([place]) 07/04/2017  . CKD (chronic kidney disease) stage V requiring chronic dialysis ([place]) 06/18/2017  . History of Cocaine abuse ([place]) 06/18/2017  . Hypertension 06/18/2017  . Infection of AV graft  for dialysis ([place]) 06/18/2017  . Anxiety 06/18/2017  . Anemia due to chronic kidney disease 06/18/2017  . Atypical atrial flutter ([place]) 06/18/2017  . Personality disorder ([place]) 06/13/2017  . Cellulitis 06/12/2017  . Adjustment disorder with mixed anxiety and depressed mood 06/10/2017  . Suicidal ideation 06/10/2017  . Arm wound, left, sequela 06/10/2017  . Dyspnea on exertion 05/29/2017  . Tachycardia 05/29/2017  . Hyperkalemia 05/22/2017  . Acute metabolic encephalopathy   . Anemia 04/23/2017  . Ascites 04/23/2017  . COPD (chronic obstructive pulmonary disease) ([place]) 04/23/2017  . Acute on chronic respiratory failure with hypoxia ([place]) 03/25/2017  . Arrhythmia 03/25/2017  . COPD GOLD 0 with flattening on inps f/v  09/27/2016  . Essential hypertension 09/27/2016  . Fluid overload 08/30/2016  . COPD exacerbation ([place]) 08/17/2016  . Hypertensive urgency 08/17/2016  . Problem with dialysis access ([place]) 07/23/2016  . Chronic hepatitis B ([place]) 03/05/2014  . Chronic hepatitis C without hepatic coma ([place]) 03/05/2014  . Internal hemorrhoids with bleeding, swelling and itching 03/05/2014  . Thrombocytopenia ([place]) 03/05/2014  . Chest pain 02/27/2014  . Alcohol abuse 04/14/2009  . Nicotine dependence, cigarettes, uncomplicated 13/24/4010  . GANGLION CYST 04/14/2009    Past Surgical History:  Procedure Laterality Date  . A/V FISTULAGRAM Left 05/26/2017   Procedure: A/V FISTULAGRAM;  Surgeon: Conrad Netarts, MD;  Location: [facility] CV LAB;  Service: Cardiovascular;  Laterality: Left;  . A/V FISTULAGRAM Right 11/18/2017   Procedure: A/V FISTULAGRAM - Right Arm;  Surgeon: Elam Dutch, MD;  Location: [facility] LAB;  Service: Cardiovascular;  Laterality: Right;  . APPLICATION OF WOUND VAC Left 06/14/2017   Procedure: APPLICATION OF WOUND VAC;  Surgeon: Katha Cabal, MD;  Location: [facility] ORS;  Service: Vascular;  Laterality: Left;  . AV FISTULA PLACEMENT  2012   BELIEVED WAS  PLACED IN JUNE  . AV FISTULA PLACEMENT Right 08/09/2017   Procedure: Creation Right arm ARTERIOVENOUS BRACHIOCEPOHALIC FISTULA;  Surgeon: Elam Dutch, MD;  Location: [facility] OR;  Service: Vascular;  Laterality: Right;  . AV FISTULA PLACEMENT Right 11/22/2017   Procedure: INSERTION OF ARTERIOVENOUS (AV) GORE-TEX GRAFT RIGHT UPPER ARM;  Surgeon: Elam Dutch, MD;  Location: [facility];  Service: Vascular;  Laterality: Right;  . BIOPSY  01/25/2018   Procedure: BIOPSY;  Surgeon: Jerene Bears, MD;  Location: [facility];  Service: Gastroenterology;;  . BIOPSY  04/10/2019   Procedure: BIOPSY;  Surgeon: Jerene Bears, MD;  Location: [facility] ENDOS[facility];  Service: Gastroenterology;;  . COLONOSCOPY    . COLONOSCOPY WITH PROPOFOL N/A 01/25/2018   Procedure: COLONOSCOPY WITH PROPOFOL;  Surgeon: Jerene Bears, MD;  Location: [facility];  Service: Gastroenterology;  Laterality: N/A;  . CORONARY STENT INTERVENTION N/A 02/22/2017   Procedure: CORONARY STENT INTERVENTION;  Surgeon: Nigel Mormon, MD;  Location: [facility] CV LAB;  Service: Cardiovascular;  Laterality: N/A;  . ESOPHAGOGASTRODUODENOSCOPY (EGD) WITH PROPOFOL N/A 01/25/2018   Procedure: ESOPHAGOGASTRODUODENOSCOPY (EGD) WITH PROPOFOL;  Surgeon: Jerene Bears, MD;  Location: [facility] ENDOSCOPY;  Service: Gastroenterology;  Laterality: N/A;  . ESOPHAGOGASTRODUODENOSCOPY (EGD) WITH PROPOFOL N/A 04/10/2019   Procedure: ESOPHAGOGASTRODUODENOSCOPY (EGD) WITH PROPOFOL;  Surgeon: Jerene Bears, MD;  Location: [facility];  Service: Gastroenterology;  Laterality: N/A;  . FLEXIBLE SIGMOIDOSCOPY N/A 07/15/2017   Procedure: FLEXIBLE SIGMOIDOSCOPY;  Surgeon: Carol Ada, MD;  Location: [facility];  Service: Endoscopy;  Laterality: N/A;  . HEMORRHOID BANDING    . I & D EXTREMITY Left 06/01/2017   Procedure: IRRIGATION AND DEBRIDEMENT LEFT ARM HEMATOMA WITH LIGATION OF LEFT ARM AV FISTULA;  Surgeon: Elam Dutch, MD;  Location: [facility];  Service: Vascular;   Laterality: Left;  . I & D EXTREMITY Left 06/14/2017   Procedure: IRRIGATION AND DEBRIDEMENT EXTREMITY;  Surgeon: Katha Cabal, MD;  Location: [facility] ORS;  Service: Vascular;  Laterality: Left;  . INSERTION OF DIALYSIS CATHETER  05/30/2017  . INSERTION OF DIALYSIS CATHETER N/A 05/30/2017   Procedure: INSERTION OF DIALYSIS CATHETER;  Surgeon: Elam Dutch, MD;  Location: [facility];  Service: Vascular;  Laterality: N/A;  . IR PARACENTESIS  08/30/2017  . IR PARACENTESIS  09/29/2017  . IR PARACENTESIS  10/28/2017  . IR PARACENTESIS  11/09/2017  . IR PARACENTESIS  11/16/2017  . IR PARACENTESIS  11/28/2017  . IR PARACENTESIS  12/01/2017  . IR PARACENTESIS  12/06/2017  . IR PARACENTESIS  01/03/2018  . IR PARACENTESIS  01/23/2018  . IR PARACENTESIS  02/07/2018  . IR PARACENTESIS  02/21/2018  . IR PARACENTESIS  03/06/2018  . IR PARACENTESIS  03/17/2018  . IR PARACENTESIS  04/04/2018  . IR PARACENTESIS  12/28/2018  . IR PARACENTESIS  01/08/2019  . IR PARACENTESIS  01/23/2019  . IR PARACENTESIS  02/01/2019  . IR PARACENTESIS  02/19/2019  . IR PARACENTESIS  03/01/2019  . IR PARACENTESIS  03/15/2019  . IR PARACENTESIS  04/03/2019  . IR PARACENTESIS  04/12/2019  . IR PARACENTESIS  05/01/2019  . IR PARACENTESIS  05/08/2019  . IR PARACENTESIS  05/24/2019  . IR PARACENTESIS  06/12/2019  . IR PARACENTESIS  07/09/2019  . IR PARACENTESIS  07/27/2019  . IR PARACENTESIS  08/09/2019  . IR PARACENTESIS  08/21/2019  . IR PARACENTESIS  09/17/2019  . IR PARACENTESIS  10/05/2019  . IR PARACENTESIS  10/29/2019  . IR PARACENTESIS  11/08/2019  . IR PARACENTESIS  12/12/2019  . IR PARACENTESIS  01/03/2020  . IR PARACENTESIS  01/10/2020  . IR PARACENTESIS  01/17/2020  . IR PARACENTESIS  01/24/2020  . IR PARACENTESIS  01/31/2020  . IR PARACENTESIS  02/07/2020  . IR RADIOLOGIST EVAL & MGMT  02/14/2018  . IR RADIOLOGIST EVAL & MGMT  02/22/2019  . LEFT HEART CATH AND CORONARY ANGIOGRAPHY N/A 02/22/2017   Procedure: LEFT HEART CATH AND CORONARY  ANGIOGRAPHY;  Surgeon: Nigel Mormon, MD;  Location: [facility] CV LAB;  Service: Cardiovascular;  Laterality: N/A;  . LIGATION OF ARTERIOVENOUS  FISTULA Left 08/14/6331   Procedure: Plication of Left Arm Arteriovenous Fistula;  Surgeon: Elam Dutch, MD;  Location: [facility];  Service: Vascular;  Laterality: Left;  . POLYPECTOMY    . POLYPECTOMY  01/25/2018   Procedure: POLYPECTOMY;  Surgeon: Jerene Bears, MD;  Location: [facility];  Service: Gastroenterology;;  . REVISON OF ARTERIOVENOUS FISTULA Left 5/45/6256   Procedure: PLICATION OF DISTAL ANEURYSMAL SEGEMENT OF LEFT  UPPER ARM ARTERIOVENOUS FISTULA;  Surgeon: Elam Dutch, MD;  Location: [facility];  Service: Vascular;  Laterality: Left;  . REVISON OF ARTERIOVENOUS FISTULA Left 2/70/3500   Procedure: Plication of Left Upper Arm Fistula ;  Surgeon: Waynetta Sandy, MD;  Location: [facility];  Service: Vascular;  Laterality: Left;  . SKIN GRAFT SPLIT THICKNESS LEG / FOOT Left    SKIN GRAFT SPLIT THICKNESS LEFT ARM DONOR SITE: LEFT ANTERIOR THIGH  . SKIN SPLIT GRAFT Left 07/04/2017   Procedure: SKIN GRAFT SPLIT THICKNESS LEFT ARM DONOR SITE: LEFT ANTERIOR THIGH;  Surgeon: Elam Dutch, MD;  Location: [facility];  Service: Vascular;  Laterality: Left;  . THROMBECTOMY W/ EMBOLECTOMY Left 06/05/2017   Procedure: EXPLORATION OF LEFT ARM FOR BLEEDING; OVERSEWED PROXIMAL FISTULA;  Surgeon: Angelia Mould, MD;  Location: [facility];  Service: Vascular;  Laterality: Left;  . WOUND EXPLORATION Left 06/03/2017   Procedure: WOUND EXPLORATION WITH WOUND VAC APPLICATION TO LEFT ARM;  Surgeon: Angelia Mould, MD;  Location: [facility] OR;  Service: Vascular;  Laterality: Left;       Family History  Problem Relation Age of Onset  . Heart disease Mother   . Lung cancer Mother   . Heart disease Father   . Malignant hyperthermia Father   . COPD Father   . Throat cancer Sister   . Esophageal cancer Sister   . Hypertension Other   . COPD  Other   . Colon cancer Neg Hx   . Colon polyps Neg Hx   . Rectal cancer Neg Hx   . Stomach cancer Neg Hx     Social History   Tobacco Use  . Smoking status: Current Every Day Smoker    Packs/day: 0.50    Years: 43.00    Pack years: 21.50    Types: Cigarettes    Start date: 08/13/1973  . Smokeless tobacco: Never Used  Vaping Use  . Vaping Use: Never used  Substance Use Topics  . Alcohol use: Not Currently    Comment: quit drinking in 2017  . Drug use: Not Currently    Types: Marijuana, Cocaine    Comment: reports using once every 3 months,  Quit 04-06-2019    Home Medications Prior to Admission medications   Medication Sig Start Date End Date Taking? Authorizing Provider  albuterol (VENTOLIN HFA) 108 (90 Base) MCG/ACT inhaler Inhale 2 puffs into the lungs every 4 (four) hours as needed for wheezing or shortness of breath.  02/03/20   [provider]  ALLERGY 10 MG tablet Patient unaware of medication name. 01/29/20   [provider]  amiodarone (PACERONE) 200 MG tablet Take 1 tablet (200 mg total) by mouth daily. 01/29/20   Shelly Coss, MD  diltiazem (CARDIZEM CD) 240 MG 24 hr capsule Take 240 mg by mouth daily. 01/29/20   [provider]  diphenhydrAMINE (BENADRYL) 25 mg capsule Take 1 capsule (25 mg total) by mouth every 6 (six) hours as needed for itching or allergies. 01/29/20   Shelly Coss, MD  gabapentin (NEURONTIN) 300 MG capsule Take 1 capsule (300 mg total) by mouth 2 (two) times daily. 01/23/20   Adrian Prows, MD  magic mouthwash w/lidocaine SOLN Take 10 mLs by mouth 3 (three) times daily for 2 days. 02/07/20 02/09/20  Adrian Prows, MD  magic mouthwash w/lidocaine SOLN Take 10 mLs by mouth 3 (three) times daily for 2 days. 02/07/20 02/09/20  Adrian Prows, MD  metoprolol tartrate (LOPRESSOR) 25 MG tablet Take  0.5 tablets (12.5 mg total) by mouth 2 (two) times daily. Patient taking differently: Take 25 mg by mouth 2 (two) times daily.  01/29/20   Shelly Coss, MD  Oxycodone HCl 10 MG TABS Take 10 mg by mouth 3 (three) times daily as needed (pain).  01/24/20   [provider]  sevelamer carbonate (RENVELA) 800 MG tablet Take 2,400 mg by mouth See admin instructions. Take 3 tablets (2400 mg) by mouth up to three times daily with meals    [provider]  tenofovir (VIREAD) 300 MG tablet Take 300 mg by mouth every Monday.  01/03/20   [provider]    Allergies    Tramadol, Morphine and related, Pollen extract, Acetaminophen, Aspirin, and Clonidine derivatives  Review of Systems   Review of Systems  Constitutional: Positive for activity change.  Respiratory: Negative for shortness of breath.   Cardiovascular: Positive for palpitations.  Gastrointestinal: Negative for nausea and vomiting.  Neurological: Positive for dizziness.  All other systems reviewed and are negative.   Physical Exam Updated Vital Signs BP (!) 98/58   Pulse 83   Temp 97.6 F (36.4 C) (Temporal)   Resp 20   SpO2 93%   Physical Exam Vitals and nursing note reviewed.  Constitutional:      Appearance: He is well-developed.  HENT:     Head: Atraumatic.  Eyes:     Extraocular Movements: Extraocular movements intact.     Pupils: Pupils are equal, round, and reactive to light.  Cardiovascular:     Rate and Rhythm: Tachycardia present. Rhythm irregular.  Pulmonary:     Effort: Pulmonary effort is normal.  Musculoskeletal:     Cervical back: Neck supple.  Skin:    General: Skin is warm.  Neurological:     Mental Status: He is alert and oriented to person, place, and time.     ED Results / Procedures / Treatments   Labs (all labs ordered are listed, but only abnormal results are displayed) Labs Reviewed  CBC WITH DIFFERENTIAL/PLATELET - Abnormal; Notable for the following components:      Result Value   WBC 15.0 (*)    RBC 4.10 (*)    Hemoglobin 11.3 (*)    HCT 32.8 (*)    RDW 17.3 (*)    Neutro Abs 12.2 (*)    Monocytes  Absolute 1.3 (*)    Abs Immature Granulocytes 0.20 (*)    All other components within normal limits  COMPREHENSIVE METABOLIC PANEL - Abnormal; Notable for the following components:   Sodium 131 (*)    Potassium 5.3 (*)    Chloride 89 (*)    Glucose, Bld 105 (*)    BUN 38 (*)    Creatinine, Ser 3.03 (*)    Albumin 2.9 (*)    AST 65 (*)    ALT 122 (*)    Alkaline Phosphatase 250 (*)    Total Bilirubin 1.4 (*)    GFR calc non Af Amer 22 (*)    GFR calc Af Amer 25 (*)    All other components within normal limits  I-STAT CHEM 8, ED - Abnormal; Notable for the following components:   Sodium 133 (*)    Potassium 5.2 (*)    Chloride 90 (*)    BUN 46 (*)    Creatinine, Ser 3.00 (*)    Glucose, Bld 105 (*)    Calcium, Ion 1.00 (*)    Hemoglobin 12.9 (*)    HCT 38.0 (*)  All other components within normal limits  PROTIME-INR    EKG EKG Interpretation  Date/Time:  Friday February 08 2020 21:26:08 EDT Ventricular Rate:  183 PR Interval:    QRS Duration: 163 QT Interval:  317 QTC Calculation: 554 R Axis:   -101 Text Interpretation: Extreme tachycardia with wide complex, no further rhythm analysis attempted wide complex tachycardia suspect afib with rvr and aberancy Confirmed by Varney Biles [phone]) on 02/08/2020 10:06:38 PM   EKG Interpretation  Date/Time:  Friday February 08 2020 21:38:47 EDT Ventricular Rate:  80 PR Interval:    QRS Duration: 161 QT Interval:  445 QTC Calculation: 514 R Axis:   -44 Text Interpretation: Atrial fibrillation Left bundle branch block No acute changes Nonspecific ST and T wave abnormality Confirmed by Varney Biles ([id number]) on 02/08/2020 11:34:10 PM        Radiology DG Chest Port 1 View  Result Date: 02/08/2020 CLINICAL DATA:  V-tach EXAM: PORTABLE CHEST 1 VIEW COMPARISON:  February 03, 2020 FINDINGS: Again noted is mild cardiomegaly. Aortic knob calcifications. There is prominence of the central pulmonary vasculature. No large airspace  consolidation or pleural effusion. There is stable elevation of the left hemidiaphragm. IMPRESSION: Mild cardiomegaly and pulmonary vascular congestion. Electronically Signed   By: Prudencio Pair M.D.   On: 02/08/2020 22:30   IR Paracentesis  Result Date: 02/07/2020 INDICATION: End-stage renal disease, cirrhosis, hepatitis B and C, recurrent ascites. Request for therapeutic paracentesis. EXAM: ULTRASOUND GUIDED PARACENTESIS MEDICATIONS: 1% lidocaine 10 mL COMPLICATIONS: None immediate. PROCEDURE: Informed written consent was obtained from the patient after a discussion of the risks, benefits and alternatives to treatment. A timeout was performed prior to the initiation of the procedure. Initial ultrasound scanning demonstrates a moderate amount of ascites within the left lateral abdomen. The left lateral abdomen was prepped and draped in the usual sterile fashion. 1% lidocaine was used for local anesthesia. Following this, a 19 gauge, 7-cm, Yueh catheter was introduced. An ultrasound image was saved for documentation purposes. The paracentesis was performed. The catheter was removed and a dressing was applied. The patient tolerated the procedure well without immediate post procedural complication. FINDINGS: A total of approximately 780 mL of clear yellow fluid was removed. IMPRESSION: Successful ultrasound-guided paracentesis yielding 780 mL of peritoneal fluid. Read by: Gareth Eagle, PA-C Electronically Signed   By: Corrie Mckusick D.O.   On: 02/07/2020 13:58    Procedures .Critical Care Performed by: Varney Biles, MD Authorized by: Varney Biles, MD   Critical care provider statement:    Critical care time (minutes):  56   Critical care was necessary to treat or prevent imminent or life-threatening deterioration of the following conditions:  Circulatory failure   Critical care was time spent personally by me on the following activities:  Discussions with consultants, evaluation of patient's response  to treatment, examination of patient, ordering and performing treatments and interventions, ordering and review of laboratory studies, ordering and review of radiographic studies, pulse oximetry, re-evaluation of patient's condition, obtaining history from patient or surrogate and review of old charts   (including critical care time)  Medications Ordered in ED Medications  lactated ringers bolus 1,000 mL (0 mLs Intravenous Stopped 02/08/20 2208)  ondansetron (ZOFRAN) 4 MG/2ML injection (4 mg  Given 02/08/20 2145)  diltiazem (CARDIZEM) tablet 30 mg (30 mg Oral Given 02/08/20 2214)  morphine (MSIR) tablet 30 mg (30 mg Oral Given 02/08/20 2214)  sodium zirconium cyclosilicate (LOKELMA) packet 10 g (10 g Oral Given 02/08/20 2332)  ED Course  I have reviewed the triage vital signs and the nursing notes.  Pertinent labs & imaging results that were available during my care of the patient were reviewed by me and considered in my medical decision making (see chart for details).  Clinical Course as of Feb 07 2345  Fri Feb 08, 2020  2215 Patient's heart rate spontaneously improved. It could be that the amiodarone from EMS might have kicked in.  We will give him oral diltiazem and continue to monitor.  He is stable for discharge with the lab results are reassuring and he remains rate controlled.   [AN]    Clinical Course User Index [AN] Varney Biles, MD   MDM Rules/Calculators/A&P                          59 year old comes in a chief complaint of shortness of breath.  He has history of A. fib, ESRD, liver cirrhosis.  Patient is noted to have wide-complex tachycardia.  It appears that he likely is having A. fib with RVR with aberrancy.  Patient is stable at arrival, but his blood pressure did drop subsequently.  We have started IV fluid bolus and will start diltiazem.  Pads have been placed in case patient decompensates, in which case he will be cardioverted.    Final Clinical Impression(s)  / ED Diagnoses Final diagnoses:  Atrial fibrillation with RVR [facility])    Rx / DC Orders ED Discharge Orders    None       Varney Biles, MD 02/08/20 5102    Varney Biles, MD 02/08/20 [phone]

## 2020-02-08 NOTE — ED Notes (Signed)
Pt stating he doesn't know how long he can wait. Pt stated he has dialysis in the morning. Pt informed staff that he is going to his car. Pt aware to come back incase his name is called.

## 2020-02-08 NOTE — Discharge Instructions (Signed)
You are seen in the ER for atrial fibrillation related complications.  We were able to get your heart rate in better control.  Please follow-up with your cardiologist for optimal management.

## 2020-02-08 NOTE — ED Triage Notes (Signed)
Pt presents to ED POV. Pt c/o GI bleeding. Pt reports that he has bright red blood in his stool. Pt says he does not know if they are loose. NAD in triage.

## 2020-02-08 NOTE — ED Triage Notes (Addendum)
Pt BIB [facility] c/o SOB, noted V Tach on monitor for EMS. Given 150 amio and 4 zofran prior to arrival.   Dialysis patient. Last dialysis today

## 2020-02-09 ENCOUNTER — Emergency Department (HOSPITAL_COMMUNITY): Payer: Medicare Other

## 2020-02-09 ENCOUNTER — Inpatient Hospital Stay (HOSPITAL_COMMUNITY)
Admission: EM | Admit: 2020-02-09 | Discharge: 2020-02-13 | DRG: 193 | Disposition: A | Discharge status: Home / Self Care | Payer: Medicare Other | Attending: Family Medicine | Admitting: Family Medicine

## 2020-02-09 ENCOUNTER — Encounter (HOSPITAL_COMMUNITY): Payer: Self-pay

## 2020-02-09 DIAGNOSIS — F17210 Nicotine dependence, cigarettes, uncomplicated: Secondary | ICD-10-CM | POA: Diagnosis present

## 2020-02-09 DIAGNOSIS — I472 Ventricular tachycardia: Secondary | ICD-10-CM | POA: Diagnosis present

## 2020-02-09 DIAGNOSIS — I252 Old myocardial infarction: Secondary | ICD-10-CM

## 2020-02-09 DIAGNOSIS — B1920 Unspecified viral hepatitis C without hepatic coma: Secondary | ICD-10-CM | POA: Diagnosis present

## 2020-02-09 DIAGNOSIS — I4819 Other persistent atrial fibrillation: Secondary | ICD-10-CM | POA: Diagnosis present

## 2020-02-09 DIAGNOSIS — Z8616 Personal history of COVID-19: Secondary | ICD-10-CM

## 2020-02-09 DIAGNOSIS — Z9115 Patient's noncompliance with renal dialysis: Secondary | ICD-10-CM

## 2020-02-09 DIAGNOSIS — Z992 Dependence on renal dialysis: Secondary | ICD-10-CM

## 2020-02-09 DIAGNOSIS — K7460 Unspecified cirrhosis of liver: Secondary | ICD-10-CM | POA: Diagnosis present

## 2020-02-09 DIAGNOSIS — Z8249 Family history of ischemic heart disease and other diseases of the circulatory system: Secondary | ICD-10-CM

## 2020-02-09 DIAGNOSIS — I2781 Cor pulmonale (chronic): Secondary | ICD-10-CM | POA: Diagnosis present

## 2020-02-09 DIAGNOSIS — B181 Chronic viral hepatitis B without delta-agent: Secondary | ICD-10-CM | POA: Diagnosis present

## 2020-02-09 DIAGNOSIS — N186 End stage renal disease: Secondary | ICD-10-CM

## 2020-02-09 DIAGNOSIS — Z808 Family history of malignant neoplasm of other organs or systems: Secondary | ICD-10-CM

## 2020-02-09 DIAGNOSIS — Z20822 Contact with and (suspected) exposure to covid-19: Secondary | ICD-10-CM | POA: Diagnosis present

## 2020-02-09 DIAGNOSIS — I132 Hypertensive heart and chronic kidney disease with heart failure and with stage 5 chronic kidney disease, or end stage renal disease: Secondary | ICD-10-CM | POA: Diagnosis present

## 2020-02-09 DIAGNOSIS — F1011 Alcohol abuse, in remission: Secondary | ICD-10-CM | POA: Diagnosis present

## 2020-02-09 DIAGNOSIS — Z66 Do not resuscitate: Secondary | ICD-10-CM

## 2020-02-09 DIAGNOSIS — F419 Anxiety disorder, unspecified: Secondary | ICD-10-CM | POA: Diagnosis present

## 2020-02-09 DIAGNOSIS — D696 Thrombocytopenia, unspecified: Secondary | ICD-10-CM | POA: Diagnosis present

## 2020-02-09 DIAGNOSIS — Z885 Allergy status to narcotic agent status: Secondary | ICD-10-CM | POA: Diagnosis not present

## 2020-02-09 DIAGNOSIS — R188 Other ascites: Secondary | ICD-10-CM | POA: Diagnosis present

## 2020-02-09 DIAGNOSIS — Z801 Family history of malignant neoplasm of trachea, bronchus and lung: Secondary | ICD-10-CM

## 2020-02-09 DIAGNOSIS — I2729 Other secondary pulmonary hypertension: Secondary | ICD-10-CM | POA: Diagnosis present

## 2020-02-09 DIAGNOSIS — J189 Pneumonia, unspecified organism: Secondary | ICD-10-CM | POA: Diagnosis present

## 2020-02-09 DIAGNOSIS — Z888 Allergy status to other drugs, medicaments and biological substances status: Secondary | ICD-10-CM

## 2020-02-09 DIAGNOSIS — Z886 Allergy status to analgesic agent status: Secondary | ICD-10-CM | POA: Diagnosis not present

## 2020-02-09 DIAGNOSIS — I483 Typical atrial flutter: Secondary | ICD-10-CM | POA: Diagnosis not present

## 2020-02-09 DIAGNOSIS — I5032 Chronic diastolic (congestive) heart failure: Secondary | ICD-10-CM | POA: Diagnosis present

## 2020-02-09 DIAGNOSIS — Z825 Family history of asthma and other chronic lower respiratory diseases: Secondary | ICD-10-CM

## 2020-02-09 DIAGNOSIS — Z9119 Patient's noncompliance with other medical treatment and regimen: Secondary | ICD-10-CM

## 2020-02-09 DIAGNOSIS — Z515 Encounter for palliative care: Secondary | ICD-10-CM | POA: Diagnosis not present

## 2020-02-09 DIAGNOSIS — N2581 Secondary hyperparathyroidism of renal origin: Secondary | ICD-10-CM | POA: Diagnosis present

## 2020-02-09 DIAGNOSIS — G9341 Metabolic encephalopathy: Secondary | ICD-10-CM | POA: Diagnosis present

## 2020-02-09 DIAGNOSIS — K7200 Acute and subacute hepatic failure without coma: Secondary | ICD-10-CM | POA: Diagnosis not present

## 2020-02-09 DIAGNOSIS — G894 Chronic pain syndrome: Secondary | ICD-10-CM | POA: Diagnosis present

## 2020-02-09 DIAGNOSIS — I4891 Unspecified atrial fibrillation: Secondary | ICD-10-CM | POA: Diagnosis present

## 2020-02-09 DIAGNOSIS — E875 Hyperkalemia: Secondary | ICD-10-CM | POA: Diagnosis present

## 2020-02-09 DIAGNOSIS — J440 Chronic obstructive pulmonary disease with acute lower respiratory infection: Secondary | ICD-10-CM | POA: Diagnosis present

## 2020-02-09 DIAGNOSIS — I447 Left bundle-branch block, unspecified: Secondary | ICD-10-CM | POA: Diagnosis present

## 2020-02-09 DIAGNOSIS — J9601 Acute respiratory failure with hypoxia: Secondary | ICD-10-CM | POA: Diagnosis not present

## 2020-02-09 DIAGNOSIS — Z79899 Other long term (current) drug therapy: Secondary | ICD-10-CM

## 2020-02-09 DIAGNOSIS — D631 Anemia in chronic kidney disease: Secondary | ICD-10-CM | POA: Diagnosis present

## 2020-02-09 DIAGNOSIS — K219 Gastro-esophageal reflux disease without esophagitis: Secondary | ICD-10-CM | POA: Diagnosis present

## 2020-02-09 DIAGNOSIS — F1410 Cocaine abuse, uncomplicated: Secondary | ICD-10-CM | POA: Diagnosis present

## 2020-02-09 DIAGNOSIS — Z800 Family history of malignant neoplasm of digestive organs: Secondary | ICD-10-CM

## 2020-02-09 DIAGNOSIS — E8889 Other specified metabolic disorders: Secondary | ICD-10-CM | POA: Diagnosis present

## 2020-02-09 DIAGNOSIS — M1990 Unspecified osteoarthritis, unspecified site: Secondary | ICD-10-CM | POA: Diagnosis present

## 2020-02-09 DIAGNOSIS — I959 Hypotension, unspecified: Secondary | ICD-10-CM | POA: Diagnosis present

## 2020-02-09 DIAGNOSIS — I2510 Atherosclerotic heart disease of native coronary artery without angina pectoris: Secondary | ICD-10-CM | POA: Diagnosis present

## 2020-02-09 DIAGNOSIS — Z7189 Other specified counseling: Secondary | ICD-10-CM | POA: Diagnosis not present

## 2020-02-09 LAB — COMPREHENSIVE METABOLIC PANEL
ALT: 97 U/L — ABNORMAL HIGH (ref 0–44)
AST: 56 U/L — ABNORMAL HIGH (ref 15–41)
Albumin: 2.6 g/dL — ABNORMAL LOW (ref 3.5–5.0)
Alkaline Phosphatase: 219 U/L — ABNORMAL HIGH (ref 38–126)
Anion gap: 17 — ABNORMAL HIGH (ref 5–15)
BUN: 56 mg/dL — ABNORMAL HIGH (ref 6–20)
CO2: 25 mmol/L (ref 22–32)
Calcium: 9.2 mg/dL (ref 8.9–10.3)
Chloride: 91 mmol/L — ABNORMAL LOW (ref 98–111)
Creatinine, Ser: 4.26 mg/dL — ABNORMAL HIGH (ref 0.61–1.24)
GFR calc Af Amer: 17 mL/min — ABNORMAL LOW (ref >60)
GFR calc non Af Amer: 14 mL/min — ABNORMAL LOW (ref >60)
Glucose, Bld: 84 mg/dL (ref 70–99)
Potassium: 5.5 mmol/L — ABNORMAL HIGH (ref 3.5–5.1)
Sodium: 133 mmol/L — ABNORMAL LOW (ref 135–145)
Total Bilirubin: 1.8 mg/dL — ABNORMAL HIGH (ref 0.3–1.2)
Total Protein: 5.8 g/dL — ABNORMAL LOW (ref 6.5–8.1)

## 2020-02-09 LAB — CBC WITH DIFFERENTIAL/PLATELET
Abs Immature Granulocytes: 0.23 10*3/uL — ABNORMAL HIGH (ref 0.00–0.07)
Basophils Absolute: 0.1 10*3/uL (ref 0.0–0.1)
Basophils Relative: 0 %
Eosinophils Absolute: 0.2 10*3/uL (ref 0.0–0.5)
Eosinophils Relative: 1 %
HCT: 30.6 % — ABNORMAL LOW (ref 39.0–52.0)
Hemoglobin: 10.4 g/dL — ABNORMAL LOW (ref 13.0–17.0)
Immature Granulocytes: 1 %
Lymphocytes Relative: 7 %
Lymphs Abs: 1.3 10*3/uL (ref 0.7–4.0)
MCH: 27.7 pg (ref 26.0–34.0)
MCHC: 34 g/dL (ref 30.0–36.0)
MCV: 81.6 fL (ref 80.0–100.0)
Monocytes Absolute: 2.3 10*3/uL — ABNORMAL HIGH (ref 0.1–1.0)
Monocytes Relative: 12 %
Neutro Abs: 15.7 10*3/uL — ABNORMAL HIGH (ref 1.7–7.7)
Neutrophils Relative %: 79 %
Platelets: 144 10*3/uL — ABNORMAL LOW (ref 150–400)
RBC: 3.75 MIL/uL — ABNORMAL LOW (ref 4.22–5.81)
RDW: 17.3 % — ABNORMAL HIGH (ref 11.5–15.5)
WBC: 19.8 10*3/uL — ABNORMAL HIGH (ref 4.0–10.5)
nRBC: 0 % (ref 0.0–0.2)

## 2020-02-09 LAB — CULTURE, BLOOD (ROUTINE X 2)
Culture: NO GROWTH
Culture: NO GROWTH
Special Requests: ADEQUATE

## 2020-02-09 LAB — SARS CORONAVIRUS 2 BY RT PCR (HOSPITAL ORDER, PERFORMED IN ~~LOC~~ HOSPITAL LAB): SARS Coronavirus 2: NEGATIVE

## 2020-02-09 LAB — MAGNESIUM: Magnesium: 2.3 mg/dL (ref 1.7–2.4)

## 2020-02-09 MED ORDER — SODIUM CHLORIDE 0.9 % IV SOLN: 100.0000 mL | INTRAVENOUS | Status: DC | PRN

## 2020-02-09 MED ORDER — HEPARIN SODIUM (PORCINE) 5000 UNIT/ML IJ SOLN
5000.0000 [IU] | Freq: Three times a day (TID) | INTRAMUSCULAR | Status: DC
  Administered 2020-02-09 – 2020-02-12 (×8): 5000 [IU] via SUBCUTANEOUS
  Filled 2020-02-09 (×9): qty 1

## 2020-02-09 MED ORDER — ALTEPLASE 2 MG IJ SOLR: 2.0000 mg | Freq: Once | INTRAMUSCULAR | Status: DC | PRN

## 2020-02-09 MED ORDER — LIDOCAINE-PRILOCAINE 2.5-2.5 % EX CREA
1.0000 "application " | TOPICAL_CREAM | CUTANEOUS | Status: DC | PRN
  Filled 2020-02-09: qty 5

## 2020-02-09 MED ORDER — HEPARIN SODIUM (PORCINE) 1000 UNIT/ML DIALYSIS
1000.0000 [IU] | INTRAMUSCULAR | Status: DC | PRN
  Filled 2020-02-09: qty 1

## 2020-02-09 MED ORDER — LIDOCAINE-PRILOCAINE 2.5-2.5 % EX CREA: 1.0000 "application " | TOPICAL_CREAM | CUTANEOUS | Status: DC | PRN

## 2020-02-09 MED ORDER — HEPARIN SODIUM (PORCINE) 1000 UNIT/ML DIALYSIS
2800.0000 [IU] | INTRAMUSCULAR | Status: DC | PRN
  Filled 2020-02-09: qty 3

## 2020-02-09 MED ORDER — LIDOCAINE HCL (PF) 1 % IJ SOLN: 5.0000 mL | INTRAMUSCULAR | Status: DC | PRN

## 2020-02-09 MED ORDER — PENTAFLUOROPROP-TETRAFLUOROETH EX AERO
1.0000 "application " | INHALATION_SPRAY | CUTANEOUS | Status: DC | PRN
  Filled 2020-02-09: qty 116

## 2020-02-09 MED ORDER — DOXYCYCLINE HYCLATE 100 MG PO TABS
100.0000 mg | ORAL_TABLET | Freq: Two times a day (BID) | ORAL | Status: DC
  Administered 2020-02-09 – 2020-02-13 (×8): 100 mg via ORAL
  Filled 2020-02-09 (×8): qty 1

## 2020-02-09 MED ORDER — GABAPENTIN 100 MG PO CAPS
100.0000 mg | ORAL_CAPSULE | Freq: Three times a day (TID) | ORAL | Status: DC
  Administered 2020-02-09 – 2020-02-13 (×11): 100 mg via ORAL
  Filled 2020-02-09 (×11): qty 1

## 2020-02-09 MED ORDER — OXYCODONE HCL 5 MG PO TABS
10.0000 mg | ORAL_TABLET | Freq: Two times a day (BID) | ORAL | Status: DC | PRN
  Administered 2020-02-09 – 2020-02-13 (×5): 10 mg via ORAL
  Filled 2020-02-09 (×6): qty 2

## 2020-02-09 MED ORDER — SODIUM CHLORIDE 0.9 % IV SOLN
1.0000 g | INTRAVENOUS | Status: DC
  Administered 2020-02-09 – 2020-02-10 (×2): 1 g via INTRAVENOUS
  Filled 2020-02-09 (×2): qty 10

## 2020-02-09 MED ORDER — PENTAFLUOROPROP-TETRAFLUOROETH EX AERO: 1.0000 "application " | INHALATION_SPRAY | CUTANEOUS | Status: DC | PRN

## 2020-02-09 MED ORDER — DILTIAZEM HCL-DEXTROSE 125-5 MG/125ML-% IV SOLN (PREMIX)
5.0000 mg/h | INTRAVENOUS | Status: DC
  Administered 2020-02-09: 5 mg/h via INTRAVENOUS
  Administered 2020-02-10: 10 mg/h via INTRAVENOUS
  Filled 2020-02-09 (×3): qty 125

## 2020-02-09 MED ORDER — OXYCODONE HCL 5 MG PO TABS
10.0000 mg | ORAL_TABLET | Freq: Once | ORAL | Status: AC
  Administered 2020-02-09: 10 mg via ORAL
  Filled 2020-02-09: qty 2

## 2020-02-09 MED ORDER — TENOFOVIR DISOPROXIL FUMARATE 300 MG PO TABS
300.0000 mg | ORAL_TABLET | ORAL | Status: DC
  Administered 2020-02-11: 300 mg via ORAL
  Filled 2020-02-09: qty 1

## 2020-02-09 MED ORDER — DILTIAZEM LOAD VIA INFUSION
10.0000 mg | Freq: Once | INTRAVENOUS | Status: AC
  Administered 2020-02-09: 10 mg via INTRAVENOUS
  Filled 2020-02-09: qty 10

## 2020-02-09 MED ORDER — HEPARIN SODIUM (PORCINE) 1000 UNIT/ML DIALYSIS: 1000.0000 [IU] | INTRAMUSCULAR | Status: DC | PRN

## 2020-02-09 NOTE — Progress Notes (Signed)
[place], NP paged because patient states that his throat is hurting which is causing him not to be able to eat.  Since he is unable to eat his head and entire body is hurting.  He said, "The pills are not working.  I need Dilaudid."  Awaiting to hear back from NP.  Patient requested 3 vanilla puddings and honored requested.

## 2020-02-09 NOTE — ED Provider Notes (Signed)
  Face-to-face evaluation   History: He presents for evaluation of palpitations, recurrent, by EMS.  During transport he was treated with adenosine, x2, without relief, therefore treated with IV Cardizem.  On arrival heart rate was improved, but has recurred, A. fib RVR.  Patient complains of shortness of breath only with ambulation.  Physical exam: Debilitated male who is alert and tachypneic.  No dysarthria or aphasia.  No acute respiratory distress.  Medical screening examination/treatment/procedure(s) were conducted as a shared visit with non-physician practitioner(s) and myself.  I personally evaluated the patient during the encounter    Daleen Bo, MD 02/10/20 1536

## 2020-02-09 NOTE — ED Notes (Signed)
bp 116/69  p 92 r 18

## 2020-02-09 NOTE — H&P (Addendum)
History and Physical        Hospital Admission Note Date: 02/09/2020  Patient name: [name] Medical record number: [id number] Date of birth: Nov 18, 1960 Age: 59 y.o. Gender: male  PCP: [name], FNP    Chief Complaint    Chief Complaint  Patient presents with  . Atrial Fibrillation      HPI:   This is a 59 year old male who is a poor historian with past medical history of ESRD on dialysis MWF, COPD, prior alcohol abuse, A. fib with poor compliance (patient of Dr. Einar Gip), cirrhosis with recurrent ascites and weekly paracentesis, chronic pain who was seen in the ED multiple times over the past week for diastolic heart failure exacerbation, A. fib, hyperkalemia most recently signed out [place] on 7/27 when he was being treated for A. fib with RVR and recently seen in the ED yesterday after being brought in by ambulance for shortness of breath and dizziness with wide-complex tachycardia and given amiodarone at the time who went home and presented again today to the ED for shortness of breath and palpitations upon awakening this morning.  Upon EMS arrival he was noted to have a heart rate in the 160s and received 6 mg then 12 mg of adenosine with no change and his rate and rhythm converted with 20 mg of Cardizem.  Of note he does admit to going to dialysis yesterday.  ED Course: Afebrile, tachycardic up to 140s, hemodynamically stable requiring nasal cannula.  Notable labs: Sodium 133, K5.5, BUN 56, creatinine 4.26, AG 17, alk phos 219, AST 56, ALT 97, T bili 1.8, WBC 19.8.  ED physician consulted patient's cardiologist who agreed to see the patient later today.  CXR with stable marked cardiomegaly no acute cardiopulmonary disease.  He was continued on his Cardizem drip  Vitals:   02/09/20 1630 02/09/20 1701  BP: 116/69 (!) 116/59  Resp: 17 18  Temp:    SpO2:        Review of Systems:  Review of Systems  Constitutional: Negative for fever.  HENT: Positive for sore throat.   Respiratory: Positive for cough and shortness of breath.   Cardiovascular: Positive for palpitations.  Gastrointestinal: Positive for nausea. Negative for vomiting.  All other systems reviewed and are negative.   Medical/Social/Family History   Past Medical History: Past Medical History:  Diagnosis Date  . Anemia   . Anxiety   . Arthritis    left shoulder  . Atherosclerosis of aorta ([place])   . Cardiomegaly   . Chest pain    DATE UNKNOWN, C/O PERIODICALLY  . Cocaine abuse ([place])   . COPD exacerbation ([place]) 08/17/2016  . Coronary artery disease    stent 02/22/17  . ESRD (end stage renal disease) on dialysis ([place])    "[name]; MWF" (07/04/2017)  . GERD (gastroesophageal reflux disease)    DATE UNKNOWN  . Hemorrhoids   . Hepatitis B, chronic ([place])   . Hepatitis C   . History of kidney stones   . Hyperkalemia   . Hypertension   . Kidney failure   . Metabolic bone disease    Patient denies  . Mitral stenosis   . Myocardial infarction ([place])   .  Pneumonia   . Pulmonary edema   . Solitary rectal ulcer syndrome 07/2017   at flex [facility] for rectal bleeding  . Tubular adenoma of colon     Past Surgical History:  Procedure Laterality Date  . A/V FISTULAGRAM Left 05/26/2017   Procedure: A/V FISTULAGRAM;  Surgeon: Conrad Bondville, MD;  Location: [facility];  Service: Cardiovascular;  Laterality: Left;  . A/V FISTULAGRAM Right 11/18/2017   Procedure: A/V FISTULAGRAM - Right Arm;  Surgeon: Elam Dutch, MD;  Location: [facility];  Service: Cardiovascular;  Laterality: Right;  . APPLICATION OF WOUND VAC Left 06/14/2017   Procedure: APPLICATION OF WOUND VAC;  Surgeon: Katha Cabal, MD;  Location: [facility];  Service: Vascular;  Laterality: Left;  . AV FISTULA PLACEMENT  2012   BELIEVED WAS PLACED IN JUNE  . AV FISTULA PLACEMENT Right 08/09/2017    Procedure: Creation Right arm ARTERIOVENOUS BRACHIOCEPOHALIC FISTULA;  Surgeon: Elam Dutch, MD;  Location: [facility];  Service: Vascular;  Laterality: Right;  . AV FISTULA PLACEMENT Right 11/22/2017   Procedure: INSERTION OF ARTERIOVENOUS (AV) GORE-TEX GRAFT RIGHT UPPER ARM;  Surgeon: Elam Dutch, MD;  Location: [facility];  Service: Vascular;  Laterality: Right;  . BIOPSY  01/25/2018   Procedure: BIOPSY;  Surgeon: Jerene Bears, MD;  Location: [facility];  Service: Gastroenterology;;  . BIOPSY  04/10/2019   Procedure: BIOPSY;  Surgeon: Jerene Bears, MD;  Location: [facility];  Service: Gastroenterology;;  . COLONOSCOPY    . COLONOSCOPY WITH PROPOFOL N/A 01/25/2018   Procedure: COLONOSCOPY WITH PROPOFOL;  Surgeon: Jerene Bears, MD;  Location: [place];  Service: Gastroenterology;  Laterality: N/A;  . CORONARY STENT INTERVENTION N/A 02/22/2017   Procedure: CORONARY STENT INTERVENTION;  Surgeon: Nigel Mormon, MD;  Location: [facility];  Service: Cardiovascular;  Laterality: N/A;  . ESOPHAGOGASTRODUODENOSCOPY (EGD) WITH PROPOFOL N/A 01/25/2018   Procedure: ESOPHAGOGASTRODUODENOSCOPY (EGD) WITH PROPOFOL;  Surgeon: Jerene Bears, MD;  Location: [facility];  Service: Gastroenterology;  Laterality: N/A;  . ESOPHAGOGASTRODUODENOSCOPY (EGD) WITH PROPOFOL N/A 04/10/2019   Procedure: ESOPHAGOGASTRODUODENOSCOPY (EGD) WITH PROPOFOL;  Surgeon: Jerene Bears, MD;  [facility]: [facility];  Service: Gastroenterology;  Laterality: N/A;  . FLEXIBLE SIGMOIDOSCOPY N/A 07/15/2017   Procedure: FLEXIBLE SIGMOIDOSCOPY;  Surgeon: Carol Ada, MD;  Location: [place];  Service: Endoscopy;  Laterality: N/A;  . HEMORRHOID BANDING    . I & D EXTREMITY Left 06/01/2017   Procedure: IRRIGATION AND DEBRIDEMENT LEFT ARM HEMATOMA WITH LIGATION OF LEFT ARM AV FISTULA;  Surgeon: Elam Dutch, MD;  Location: [facility];  Service: Vascular;  Laterality: Left;  . I & D EXTREMITY Left 06/14/2017    Procedure: IRRIGATION AND DEBRIDEMENT EXTREMITY;  Surgeon: Katha Cabal, MD;  Location: [facility];  Service: Vascular;  Laterality: Left;  . INSERTION OF DIALYSIS CATHETER  05/30/2017  . INSERTION OF DIALYSIS CATHETER N/A 05/30/2017   Procedure: INSERTION OF DIALYSIS CATHETER;  Surgeon: Elam Dutch, MD;  Location: [place] [facility];  Service: Vascular;  Laterality: N/A;  . IR PARACENTESIS  08/30/2017  . IR PARACENTESIS  09/29/2017  . IR PARACENTESIS  10/28/2017  . IR PARACENTESIS  11/09/2017  . IR PARACENTESIS  11/16/2017  . IR PARACENTESIS  11/28/2017  . IR PARACENTESIS  12/01/2017  . IR PARACENTESIS  12/06/2017  . IR PARACENTESIS  01/03/2018  . IR PARACENTESIS  01/23/2018  . IR PARACENTESIS  02/07/2018  . IR PARACENTESIS  02/21/2018  .  IR PARACENTESIS  03/06/2018  . IR PARACENTESIS  03/17/2018  . IR PARACENTESIS  04/04/2018  . IR PARACENTESIS  12/28/2018  . IR PARACENTESIS  01/08/2019  . IR PARACENTESIS  01/23/2019  . IR PARACENTESIS  02/01/2019  . IR PARACENTESIS  02/19/2019  . IR PARACENTESIS  03/01/2019  . IR PARACENTESIS  03/15/2019  . IR PARACENTESIS  04/03/2019  . IR PARACENTESIS  04/12/2019  . IR PARACENTESIS  05/01/2019  . IR PARACENTESIS  05/08/2019  . IR PARACENTESIS  05/24/2019  . IR PARACENTESIS  06/12/2019  . IR PARACENTESIS  07/09/2019  . IR PARACENTESIS  07/27/2019  . IR PARACENTESIS  08/09/2019  . IR PARACENTESIS  08/21/2019  . IR PARACENTESIS  09/17/2019  . IR PARACENTESIS  10/05/2019  . IR PARACENTESIS  10/29/2019  . IR PARACENTESIS  11/08/2019  . IR PARACENTESIS  12/12/2019  . IR PARACENTESIS  01/03/2020  . IR PARACENTESIS  01/10/2020  . IR PARACENTESIS  01/17/2020  . IR PARACENTESIS  01/24/2020  . IR PARACENTESIS  01/31/2020  . IR PARACENTESIS  02/07/2020  . IR RADIOLOGIST EVAL & MGMT  02/14/2018  . IR RADIOLOGIST EVAL & MGMT  02/22/2019  . LEFT HEART CATH AND CORONARY ANGIOGRAPHY N/A 02/22/2017   Procedure: LEFT HEART CATH AND CORONARY ANGIOGRAPHY;  Surgeon: Nigel Mormon, MD;  Location:  [facility];  Service: Cardiovascular;  Laterality: N/A;  . LIGATION OF ARTERIOVENOUS  FISTULA Left 02/09/8298   Procedure: Plication of Left Arm Arteriovenous Fistula;  Surgeon: Elam Dutch, MD;  Location: [facility];  Service: Vascular;  Laterality: Left;  . POLYPECTOMY    . POLYPECTOMY  01/25/2018   Procedure: POLYPECTOMY;  Surgeon: Jerene Bears, MD;  Location: [facility];  Service: Gastroenterology;;  . REVISON OF ARTERIOVENOUS FISTULA Left 3/71/6967   Procedure: PLICATION OF DISTAL ANEURYSMAL SEGEMENT OF LEFT UPPER ARM ARTERIOVENOUS FISTULA;  Surgeon: Elam Dutch, MD;  Location: [facility];  Service: Vascular;  Laterality: Left;  . REVISON OF ARTERIOVENOUS FISTULA Left 8/93/8101   Procedure: Plication of Left Upper Arm Fistula ;  Surgeon: Waynetta Sandy, MD;  Location: [place];  Service: Vascular;  Laterality: Left;  . SKIN GRAFT SPLIT THICKNESS LEG / FOOT Left    SKIN GRAFT SPLIT THICKNESS LEFT ARM DONOR SITE: LEFT ANTERIOR THIGH  . SKIN SPLIT GRAFT Left 07/04/2017   Procedure: SKIN GRAFT SPLIT THICKNESS LEFT ARM DONOR SITE: LEFT ANTERIOR THIGH;  Surgeon: Elam Dutch, MD;  Location: [place];  Service: Vascular;  Laterality: Left;  . THROMBECTOMY W/ EMBOLECTOMY Left 06/05/2017   Procedure: EXPLORATION OF LEFT ARM FOR BLEEDING; OVERSEWED PROXIMAL FISTULA;  Surgeon: Angelia Mould, MD;  Location: [facility];  Service: Vascular;  Laterality: Left;  . WOUND EXPLORATION Left 06/03/2017   Procedure: WOUND EXPLORATION WITH WOUND VAC APPLICATION TO LEFT ARM;  Surgeon: Angelia Mould, MD;  Location: [facility];  Service: Vascular;  Laterality: Left;    Medications: Prior to Admission medications   Medication Sig Start Date End Date Taking? Authorizing Provider  albuterol (VENTOLIN HFA) 108 (90 Base) MCG/ACT inhaler Inhale 2 puffs into the lungs every 4 (four) hours as needed for wheezing or shortness of breath.  02/03/20  Yes [provider]  amiodarone (PACERONE)  200 MG tablet Take 1 tablet (200 mg total) by mouth daily. 01/29/20  Yes Shelly Coss, MD  diltiazem (CARDIZEM CD) 240 MG 24 hr capsule Take 240 mg by mouth daily. 01/29/20  Yes [provider]  diphenhydrAMINE (BENADRYL) 25 mg capsule Take 1 capsule (25 mg total) by mouth every 6 (six) hours as needed for itching or allergies. 01/29/20  Yes Burnadette Pop, MD  gabapentin (NEURONTIN) 300 MG capsule Take 1 capsule (300 mg total) by mouth 2 (two) times daily. 01/23/20  Yes Yates Decamp, MD  magic mouthwash w/lidocaine SOLN Take 10 mLs by mouth 3 (three) times daily for 2 days. 02/07/20 02/09/20 Yes Yates Decamp, MD  metoprolol tartrate (LOPRESSOR) 25 MG tablet Take 0.5 tablets (12.5 mg total) by mouth 2 (two) times daily. Patient taking differently: Take 25 mg by mouth 2 (two) times daily.  01/29/20  Yes Burnadette Pop, MD  Oxycodone HCl 10 MG TABS Take 10 mg by mouth 3 (three) times daily as needed (pain).  01/24/20  Yes [provider]  sevelamer carbonate (RENVELA) 800 MG tablet Take 2,400 mg by mouth See admin instructions. Take 3 tablets (2400 mg) by mouth up to three times daily with meals   Yes [provider]  tenofovir (VIREAD) 300 MG tablet Take 300 mg by mouth every Monday.  01/03/20  Yes [provider]    Allergies:   Allergies  Allergen Reactions  . Tramadol Itching and Other (See Comments)  . Morphine And Related Other (See Comments)    Stomach pain  . Pollen Extract Other (See Comments)    Other reaction(s): Sneezing (finding)  . Acetaminophen Nausea Only    Stomach ache   . Aspirin Itching and Other (See Comments)    STOMACH PAIN   . Clonidine Derivatives Itching    Social History:  reports that he has been smoking cigarettes. He started smoking about 46 years ago. He has a 21.50 pack-year smoking history. He has never used smokeless tobacco. He reports previous alcohol use. He reports previous drug use. Drugs: Marijuana and Cocaine.  Family  History: Family History  Problem Relation Age of Onset  . Heart disease Mother   . Lung cancer Mother   . Heart disease Father   . Malignant hyperthermia Father   . COPD Father   . Throat cancer Sister   . Esophageal cancer Sister   . Hypertension Other   . COPD Other   . Colon cancer Neg Hx   . Colon polyps Neg Hx   . Rectal cancer Neg Hx   . Stomach cancer Neg Hx      Objective   Physical Exam: Blood pressure (!) 116/59, temperature 98.1 F (36.7 C), temperature source Oral, resp. rate 18, SpO2 99 %.  Physical Exam Vitals and nursing note reviewed.  Constitutional:      Comments: Chronically ill-appearing  HENT:     Head: Normocephalic.     Mouth/Throat:     Mouth: Mucous membranes are moist.  Eyes:     Conjunctiva/sclera: Conjunctivae normal.  Cardiovascular:     Rate and Rhythm: Tachycardia present. Rhythm irregular.  Pulmonary:     Effort: Pulmonary effort is normal.     Breath sounds: No wheezing.     Comments: Cough Abdominal:     General: There is distension.  Musculoskeletal:     Comments: Trace to 1+ bilateral lower extremity pitting edema  Neurological:     Mental Status: He is alert. Mental status is at baseline.  Psychiatric:        Mood and Affect: Mood normal.        Behavior: Behavior normal.     LABS on Admission: I have personally reviewed all the labs and  imaging below    Basic Metabolic Panel: Recent Labs  Lab 02/05/20 0255 02/08/20 0254 02/08/20 2127 02/08/20 2127 02/08/20 2147 02/09/20 1344  NA 134*   < > 131*   < > 133* 133*  K 5.5*   < > 5.3*   < > 5.2* 5.5*  CL 93*   < > 89*   < > 90* 91*  CO2 28   < > 31  --   --  25  GLUCOSE 102*   < > 105*   < > 105* 84  BUN 50*   < > 38*   < > 46* 56*  CREATININE 4.27*   < > 3.03*   < > 3.00* 4.26*  CALCIUM 9.6   < > 9.1  --   --  9.2  PHOS 4.6  --   --   --   --   --    < > = values in this interval not displayed.   Liver Function Tests: Recent Labs  Lab 02/08/20 2127  02/09/20 1344  AST 65* 56*  ALT 122* 97*  ALKPHOS 250* 219*  BILITOT 1.4* 1.8*  PROT 6.6 5.8*  ALBUMIN 2.9* 2.6*   No results for input(s): LIPASE, AMYLASE in the last 168 hours. No results for input(s): AMMONIA in the last 168 hours. CBC: Recent Labs  Lab 02/08/20 2127 02/08/20 2127 02/08/20 2147 02/09/20 1344  WBC 15.0*  --   --  19.8*  NEUTROABS 12.2*   < >  --  15.7*  HGB 11.3*   < > 12.9* 10.4*  HCT 32.8*   < > 38.0* 30.6*  MCV 80.0   < >  --  81.6  PLT 173  --   --  144*   < > = values in this interval not displayed.   Cardiac Enzymes: No results for input(s): CKTOTAL, CKMB, CKMBINDEX, TROPONINI in the last 168 hours. BNP: Invalid input(s): POCBNP CBG: Recent Labs  Lab 02/03/20 2241 02/05/20 0721  GLUCAP 134* 89    Radiological Exams on Admission:  DG Chest 2 View  Result Date: 02/09/2020 CLINICAL DATA:  59 year old with palpitations, generalized weakness and shortness of breath. The patient was seen in the emergency department yesterday for atrial fibrillation with rapid ventricular response, and recurrence of the atrial fibrillation was noted upon EMS arrival. Patient had successful cardioversion with Cardizem after unsuccessful attempts with adenosine. EXAM: CHEST - 2 VIEW COMPARISON:  02/08/2020 and earlier. FINDINGS: Cardiac silhouette markedly enlarged, unchanged. Thoracic aorta and proximal great vessels severely atherosclerotic. Prominent central pulmonary arteries, unchanged. Lungs clear. Mild pulmonary venous hypertension without overt edema. No visible pleural effusions. IMPRESSION: Stable marked cardiomegaly. No acute cardiopulmonary disease. Electronically Signed   By: Evangeline Dakin M.D.   On: 02/09/2020 14:40   DG Chest Port 1 View  Result Date: 02/08/2020 CLINICAL DATA:  V-tach EXAM: PORTABLE CHEST 1 VIEW COMPARISON:  February 03, 2020 FINDINGS: Again noted is mild cardiomegaly. Aortic knob calcifications. There is prominence of the central pulmonary  vasculature. No large airspace consolidation or pleural effusion. There is stable elevation of the left hemidiaphragm. IMPRESSION: Mild cardiomegaly and pulmonary vascular congestion. Electronically Signed   By: Prudencio Pair M.D.   On: 02/08/2020 22:30      EKG: Independently reviewed.    A & P   Principal Problem:   Atrial fibrillation with RVR (HCC) Active Problems:   Chronic hepatitis B (HCC)   ESRD on dialysis ([place])   Cirrhosis of liver with  ascites ([place])   CAP (community acquired pneumonia)   1. A. fib with RVR a. Received adenosine 6 mg and 12 mg by EMS without improvement and chemically converted with Cardizem drip b. CHA2DS2-VASc: 2, not on anticoagulation c. He has poor medical compliance d. Patient's cardiologist, Dr. Einar Gip, was consulted by the ED physician e. Continue Cardizem drip for now pending cardiology recommendations  2. ESRD on HD MWF  hyperkalemia a. Went for his last treatment yesterday b. K5.5 c. Nephrology consulted d. Renal diet  3. Chronic HBV and HCV with cirrhosis and recurrent ascites a. Gets weekly paracenteses b. Has abdominal distention on exam c. US paracentesis  4. Chronic pain a. Continue home oxycodone  5. SIRS, concern for CAP vs. Heart failure exacerbation a. Tachycardic, short of breath requiring O2, cough and with leukocytosis b. CXR without any obvious pneumonia c. Follow-up COVID-19 swab d. No wheezing noted e. Continue home albuterol f. Start on CAP therapy with ceftriaxone and doxycycline  6. HFpEF, concern for exacerbation a. Cardiology consulted b. Nephrology consulted c. Follow daily weights and intake/output   DVT prophylaxis: Heparin   Code Status: Prior  Diet: Renal with fluid restriction Family Communication: Admission, patients condition and plan of care including tests being ordered have been discussed with the patient who indicates understanding and agrees with the plan and Code Status.  Disposition  Plan: The appropriate patient status for this patient is INPATIENT. Inpatient status is judged to be reasonable and necessary in order to provide the required intensity of service to ensure the patient's safety. The patient's presenting symptoms, physical exam findings, and initial radiographic and laboratory data in the context of their chronic comorbidities is felt to place them at high risk for further clinical deterioration. Furthermore, it is not anticipated that the patient will be medically stable for discharge from the hospital within 2 midnights of admission. The following factors support the patient status of inpatient.   " The patient's presenting symptoms include shortness of breath. " The worrisome physical exam findings include requiring O2, short of breath, cough. " The initial radiographic and laboratory data are worrisome because of leukocytosis, A. fib with RVR. " The chronic co-morbidities include ESRD, A. fib, noncompliance.   * I certify that at the point of admission it is my clinical judgment that the patient will require inpatient hospital care spanning beyond 2 midnights from the point of admission due to high intensity of service, high risk for further deterioration and high frequency of surveillance required.*   Status is: Inpatient  Remains inpatient appropriate because:IV treatments appropriate due to intensity of illness or inability to take PO and Inpatient level of care appropriate due to severity of illness   Dispo: The patient is from: Home              Anticipated d/c is to: tbd              Anticipated d/c date is: 3 days              Patient currently is not medically stable to d/c.         The medical decision making on this patient was of high complexity and the patient is at high risk for clinical deterioration, therefore this is a level 3  admission.  Consultants  . Nephrology . Cardiology   Procedures  . none  Time Spent on Admission: 76  minutes    Harold Hedge, DO [facility] Pager [phone] 02/09/2020, 6:08  PM

## 2020-02-09 NOTE — ED Triage Notes (Signed)
Pt bib [facility] from home w/ c/o palpitations, generalized weakness, and sob. Pt seen here yesterday for a-fib RVR and pt noted to be in A-fib RVR w/ HR 160's w/ EMS today. Pt received 6mg  adenosine and then 12 mg adenosine w/ EMS today with no change. Pt then received 20 mg Cardizem w/ successful chemical cardioversion. Pt NSR w/ HR 80's on arrival to EMS. Pt AOx4, neuro intact. BP 105/57, RR 18, 94% on RA.

## 2020-02-09 NOTE — ED Notes (Signed)
Report called to rn on 6e 

## 2020-02-09 NOTE — Progress Notes (Signed)
Tele shows NSR at 91.  Cardizem gtt infusing at 5.  Will continue infusing overnight per order and report (and note from ED RN) and monitor patient.  No acute distress present.

## 2020-02-09 NOTE — ED Provider Notes (Addendum)
[facility] EMERGENCY DEPARTMENT Provider Note   CSN: [id number] Arrival date & time: 02/09/20  1311     History Chief Complaint  Patient presents with  . Atrial Fibrillation    [name] is a 59 y.o. male.  HPI      [name] is a 59 y.o. male, with a history of cardiomegaly, ESRD on dialysis, COPD, hepatitis C, hepatic cirrhosis, presenting to the ED with palpitations noted this morning upon waking.  Accompanied by shortness of breath at that time. He has had similar sensation in the past with onset of A. fib RVR. He has not taken his medications at home for the past 2 days. Patient is on a Monday, Wednesday, Friday dialysis schedule and did complete his dialysis yesterday. Patient also states he undergoes weekly paracenteses with last episode July 29. With EMS patient had rapid heart rate in the 160s.  He received 6 mg, then 12 mg of adenosine with no change.  Rate and rhythm converted with 20 mg Cardizem.  Denies fever/chills, recent illness, acute cough, shortness of breath currently, chest pain, lower extremity edema, abdominal pain, N/V/D, or any other complaints.  Past Medical History:  Diagnosis Date  . Anemia   . Anxiety   . Arthritis    left shoulder  . Atherosclerosis of aorta ([place])   . Cardiomegaly   . Chest pain    DATE UNKNOWN, C/O PERIODICALLY  . Cocaine abuse ([place])   . COPD exacerbation ([place]) 08/17/2016  . Coronary artery disease    stent 02/22/17  . ESRD (end stage renal disease) on dialysis ([place])    "[name]; MWF" (07/04/2017)  . GERD (gastroesophageal reflux disease)    DATE UNKNOWN  . Hemorrhoids   . Hepatitis B, chronic ([place])   . Hepatitis C   . History of kidney stones   . Hyperkalemia   . Hypertension   . Kidney failure   . Metabolic bone disease    Patient denies  . Mitral stenosis   . Myocardial infarction ([place])   . Pneumonia   . Pulmonary edema   . Solitary rectal ulcer syndrome 07/2017    at flex sig for rectal bleeding  . Tubular adenoma of colon     Patient Active Problem List   Diagnosis Date Noted  . Leukocytosis 01/19/2020  . Tobacco use 01/19/2020  . Acute pulmonary edema ([place]) 12/21/2019  . Acetaminophen overdose 10/27/2019  . ESRD (end stage renal disease) ([place])   . Goals of care, counseling/discussion   . Hypoxia 10/04/2019  . Advanced care planning/counseling discussion   . Renal failure 09/26/2019  . Dyspnea 06/09/2019  . Acquired thrombophilia ([place]) 06/05/2019  . A-fib ([place]) 05/30/2019  . Atrial fibrillation with RVR ([place]) 05/29/2019  . Melena   . Pressure injury of skin 03/09/2019  . Abdominal distention   . Volume overload 12/28/2018  . Sepsis ([place]) 09/12/2018  . Coronary artery disease involving native coronary artery of native heart without angina pectoris 03/11/2018  . Benign neoplasm of cecum   . Benign neoplasm of ascending colon   . Benign neoplasm of descending colon   . Benign neoplasm of rectum   . AF (paroxysmal atrial fibrillation) ([place]) 01/23/2018  . Hx of colonic polyps 01/20/2018  . End-stage renal disease on hemodialysis ([place]) 11/21/2017  . GERD (gastroesophageal reflux disease) 11/16/2017  . Decompensated hepatic cirrhosis ([place]) 11/15/2017  . Palliative care by specialist   . Hyponatremia 11/04/2017  . SBP (spontaneous bacterial  peritonitis) ([place]) 10/30/2017  . Liver disease, chronic 10/30/2017  . SOB (shortness of breath)   . Abdominal pain 10/28/2017  . Upper airway cough syndrome with flattening on f/v loop 10/13/17 c/w vcd 10/17/2017  . Elevated diaphragm 10/13/2017  . Ileus ([place]) 09/29/2017  . QT prolongation 09/29/2017  . Malnutrition of moderate degree 09/29/2017  . Sinus congestion 09/03/2017  . Symptomatic anemia 09/02/2017  . Cirrhosis of liver with ascites ([place]) 09/02/2017  . Left bundle branch block 09/02/2017  . Mitral stenosis 09/02/2017  . Hematochezia 07/15/2017  . Wide-complex tachycardia ([place])   .  Endotracheally intubated   . ESRD on dialysis ([place]) 07/04/2017  . CKD (chronic kidney disease) stage V requiring chronic dialysis ([place]) 06/18/2017  . History of Cocaine abuse ([place]) 06/18/2017  . Hypertension 06/18/2017  . Infection of AV graft for dialysis ([place]) 06/18/2017  . Anxiety 06/18/2017  . Anemia due to chronic kidney disease 06/18/2017  . Atypical atrial flutter ([place]) 06/18/2017  . Personality disorder ([place]) 06/13/2017  . Cellulitis 06/12/2017  . Adjustment disorder with mixed anxiety and depressed mood 06/10/2017  . Suicidal ideation 06/10/2017  . Arm wound, left, sequela 06/10/2017  . Dyspnea on exertion 05/29/2017  . Tachycardia 05/29/2017  . Hyperkalemia 05/22/2017  . Acute metabolic encephalopathy   . Anemia 04/23/2017  . Ascites 04/23/2017  . COPD (chronic obstructive pulmonary disease) ([place]) 04/23/2017  . Acute on chronic respiratory failure with hypoxia ([place]) 03/25/2017  . Arrhythmia 03/25/2017  . COPD GOLD 0 with flattening on inps f/v  09/27/2016  . Essential hypertension 09/27/2016  . Fluid overload 08/30/2016  . COPD exacerbation ([place]) 08/17/2016  . Hypertensive urgency 08/17/2016  . Problem with dialysis access ([place]) 07/23/2016  . Chronic hepatitis B ([place]) 03/05/2014  . Chronic hepatitis C without hepatic coma ([place]) 03/05/2014  . Internal hemorrhoids with bleeding, swelling and itching 03/05/2014  . Thrombocytopenia ([place]) 03/05/2014  . Chest pain 02/27/2014  . Alcohol abuse 04/14/2009  . Nicotine dependence, cigarettes, uncomplicated 19/62/2297  . GANGLION CYST 04/14/2009    Past Surgical History:  Procedure Laterality Date  . A/V FISTULAGRAM Left 05/26/2017   Procedure: A/V FISTULAGRAM;  Surgeon: Conrad Hilltop, MD;  Location: [facility];  Service: Cardiovascular;  Laterality: Left;  . A/V FISTULAGRAM Right 11/18/2017   Procedure: A/V FISTULAGRAM - Right Arm;  Surgeon: Elam Dutch, MD;  Location: [facility];  Service:  Cardiovascular;  Laterality: Right;  . APPLICATION OF WOUND VAC Left 06/14/2017   Procedure: APPLICATION OF WOUND VAC;  Surgeon: Katha Cabal, MD;  Location: [facility];  Service: Vascular;  Laterality: Left;  . AV FISTULA PLACEMENT  2012   BELIEVED WAS PLACED IN JUNE  . AV FISTULA PLACEMENT Right 08/09/2017   Procedure: Creation Right arm ARTERIOVENOUS BRACHIOCEPOHALIC FISTULA;  Surgeon: Elam Dutch, MD;  Location: [facility] OR;  Service: Vascular;  Laterality: Right;  . AV FISTULA PLACEMENT Right 11/22/2017   Procedure: INSERTION OF ARTERIOVENOUS (AV) GORE-TEX GRAFT RIGHT UPPER ARM;  Surgeon: Elam Dutch, MD;  Location: [facility];  Service: Vascular;  Laterality: Right;  . BIOPSY  01/25/2018   Procedure: BIOPSY;  Surgeon: Jerene Bears, MD;  Location: [facility];  Service: Gastroenterology;;  . BIOPSY  04/10/2019   Procedure: BIOPSY;  Surgeon: Jerene Bears, MD;  Location: [facility];  Service: Gastroenterology;;  . COLONOSCOPY    . COLONOSCOPY WITH PROPOFOL N/A 01/25/2018   Procedure: COLONOSCOPY WITH PROPOFOL;  Surgeon: Jerene Bears, MD;  [facility];  Service: Gastroenterology;  Laterality: N/A;  . CORONARY STENT INTERVENTION N/A 02/22/2017   Procedure: CORONARY STENT INTERVENTION;  Surgeon: Nigel Mormon, MD;  Location: [facility];  Service: Cardiovascular;  Laterality: N/A;  . ESOPHAGOGASTRODUODENOSCOPY (EGD) WITH PROPOFOL N/A 01/25/2018   Procedure: ESOPHAGOGASTRODUODENOSCOPY (EGD) WITH PROPOFOL;  Surgeon: Jerene Bears, MD;  Location: [facility];  Service: Gastroenterology;  Laterality: N/A;  . ESOPHAGOGASTRODUODENOSCOPY (EGD) WITH PROPOFOL N/A 04/10/2019   Procedure: ESOPHAGOGASTRODUODENOSCOPY (EGD) WITH PROPOFOL;  Surgeon: Jerene Bears, MD;  [facility];  Service: Gastroenterology;  Laterality: N/A;  . FLEXIBLE SIGMOIDOSCOPY N/A 07/15/2017   Procedure: FLEXIBLE SIGMOIDOSCOPY;  Surgeon: Carol Ada, MD;  Location: [facility];  Service:  Endoscopy;  Laterality: N/A;  . HEMORRHOID BANDING    . I & D EXTREMITY Left 06/01/2017   Procedure: IRRIGATION AND DEBRIDEMENT LEFT ARM HEMATOMA WITH LIGATION OF LEFT ARM AV FISTULA;  Surgeon: Elam Dutch, MD;  Location: [facility];  Service: Vascular;  Laterality: Left;  . I & D EXTREMITY Left 06/14/2017   Procedure: IRRIGATION AND DEBRIDEMENT EXTREMITY;  Surgeon: Katha Cabal, MD;  Location: [facility];  Service: Vascular;  Laterality: Left;  . INSERTION OF DIALYSIS CATHETER  05/30/2017  . INSERTION OF DIALYSIS CATHETER N/A 05/30/2017   Procedure: INSERTION OF DIALYSIS CATHETER;  Surgeon: Elam Dutch, MD;  Location: [facility];  Service: Vascular;  Laterality: N/A;  . IR PARACENTESIS  08/30/2017  . IR PARACENTESIS  09/29/2017  . IR PARACENTESIS  10/28/2017  . IR PARACENTESIS  11/09/2017  . IR PARACENTESIS  11/16/2017  . IR PARACENTESIS  11/28/2017  . IR PARACENTESIS  12/01/2017  . IR PARACENTESIS  12/06/2017  . IR PARACENTESIS  01/03/2018  . IR PARACENTESIS  01/23/2018  . IR PARACENTESIS  02/07/2018  . IR PARACENTESIS  02/21/2018  . IR PARACENTESIS  03/06/2018  . IR PARACENTESIS  03/17/2018  . IR PARACENTESIS  04/04/2018  . IR PARACENTESIS  12/28/2018  . IR PARACENTESIS  01/08/2019  . IR PARACENTESIS  01/23/2019  . IR PARACENTESIS  02/01/2019  . IR PARACENTESIS  02/19/2019  . IR PARACENTESIS  03/01/2019  . IR PARACENTESIS  03/15/2019  . IR PARACENTESIS  04/03/2019  . IR PARACENTESIS  04/12/2019  . IR PARACENTESIS  05/01/2019  . IR PARACENTESIS  05/08/2019  . IR PARACENTESIS  05/24/2019  . IR PARACENTESIS  06/12/2019  . IR PARACENTESIS  07/09/2019  . IR PARACENTESIS  07/27/2019  . IR PARACENTESIS  08/09/2019  . IR PARACENTESIS  08/21/2019  . IR PARACENTESIS  09/17/2019  . IR PARACENTESIS  10/05/2019  . IR PARACENTESIS  10/29/2019  . IR PARACENTESIS  11/08/2019  . IR PARACENTESIS  12/12/2019  . IR PARACENTESIS  01/03/2020  . IR PARACENTESIS  01/10/2020  . IR PARACENTESIS  01/17/2020  . IR PARACENTESIS   01/24/2020  . IR PARACENTESIS  01/31/2020  . IR PARACENTESIS  02/07/2020  . IR RADIOLOGIST EVAL & MGMT  02/14/2018  . IR RADIOLOGIST EVAL & MGMT  02/22/2019  . LEFT HEART CATH AND CORONARY ANGIOGRAPHY N/A 02/22/2017   Procedure: LEFT HEART CATH AND CORONARY ANGIOGRAPHY;  Surgeon: Nigel Mormon, MD;  Location: [facility];  Service: Cardiovascular;  Laterality: N/A;  . LIGATION OF ARTERIOVENOUS  FISTULA Left 03/19/3381   Procedure: Plication of Left Arm Arteriovenous Fistula;  Surgeon: Elam Dutch, MD;  Location: [facility];  Service: Vascular;  Laterality: Left;  . POLYPECTOMY    .  POLYPECTOMY  01/25/2018   Procedure: POLYPECTOMY;  Surgeon: Jerene Bears, MD;  Location: [facility];  Service: Gastroenterology;;  . REVISON OF ARTERIOVENOUS FISTULA Left 7/56/4332   Procedure: PLICATION OF DISTAL ANEURYSMAL SEGEMENT OF LEFT UPPER ARM ARTERIOVENOUS FISTULA;  Surgeon: Elam Dutch, MD;  Location: [facility];  Service: Vascular;  Laterality: Left;  . REVISON OF ARTERIOVENOUS FISTULA Left 9/51/8841   Procedure: Plication of Left Upper Arm Fistula ;  Surgeon: Waynetta Sandy, MD;  Location: [facility];  Service: Vascular;  Laterality: Left;  . SKIN GRAFT SPLIT THICKNESS LEG / FOOT Left    SKIN GRAFT SPLIT THICKNESS LEFT ARM DONOR SITE: LEFT ANTERIOR THIGH  . SKIN SPLIT GRAFT Left 07/04/2017   Procedure: SKIN GRAFT SPLIT THICKNESS LEFT ARM DONOR SITE: LEFT ANTERIOR THIGH;  Surgeon: Elam Dutch, MD;  Location: [facility];  Service: Vascular;  Laterality: Left;  . THROMBECTOMY W/ EMBOLECTOMY Left 06/05/2017   Procedure: EXPLORATION OF LEFT ARM FOR BLEEDING; OVERSEWED PROXIMAL FISTULA;  Surgeon: Angelia Mould, MD;  Location: [name];  Service: Vascular;  Laterality: Left;  . WOUND EXPLORATION Left 06/03/2017   Procedure: WOUND EXPLORATION WITH WOUND VAC APPLICATION TO LEFT ARM;  Surgeon: Angelia Mould, MD;  Location: [facility] OR;  Service: Vascular;  Laterality: Left;       Family  History  Problem Relation Age of Onset  . Heart disease Mother   . Lung cancer Mother   . Heart disease Father   . Malignant hyperthermia Father   . COPD Father   . Throat cancer Sister   . Esophageal cancer Sister   . Hypertension Other   . COPD Other   . Colon cancer Neg Hx   . Colon polyps Neg Hx   . Rectal cancer Neg Hx   . Stomach cancer Neg Hx     Social History   Tobacco Use  . Smoking status: Current Every Day Smoker    Packs/day: 0.50    Years: 43.00    Pack years: 21.50    Types: Cigarettes    Start date: 08/13/1973  . Smokeless tobacco: Never Used  Vaping Use  . Vaping Use: Never used  Substance Use Topics  . Alcohol use: Not Currently    Comment: quit drinking in 2017  . Drug use: Not Currently    Types: Marijuana, Cocaine    Comment: reports using once every 3 months,  Quit 04-06-2019    Home Medications Prior to Admission medications   Medication Sig Start Date End Date Taking? Authorizing Provider  albuterol (VENTOLIN HFA) 108 (90 Base) MCG/ACT inhaler Inhale 2 puffs into the lungs every 4 (four) hours as needed for wheezing or shortness of breath.  02/03/20  Yes [provider]  amiodarone (PACERONE) 200 MG tablet Take 1 tablet (200 mg total) by mouth daily. 01/29/20  Yes Shelly Coss, MD  diltiazem (CARDIZEM CD) 240 MG 24 hr capsule Take 240 mg by mouth daily. 01/29/20  Yes [provider]  diphenhydrAMINE (BENADRYL) 25 mg capsule Take 1 capsule (25 mg total) by mouth every 6 (six) hours as needed for itching or allergies. 01/29/20  Yes Shelly Coss, MD  gabapentin (NEURONTIN) 300 MG capsule Take 1 capsule (300 mg total) by mouth 2 (two) times daily. 01/23/20  Yes Adrian Prows, MD  magic mouthwash w/lidocaine SOLN Take 10 mLs by mouth 3 (three) times daily for 2 days. 02/07/20 02/09/20 Yes Adrian Prows, MD  metoprolol tartrate (LOPRESSOR) 25 MG tablet Take 0.5  tablets (12.5 mg total) by mouth 2 (two) times daily. Patient taking differently:  Take 25 mg by mouth 2 (two) times daily.  01/29/20  Yes Shelly Coss, MD  Oxycodone HCl 10 MG TABS Take 10 mg by mouth 3 (three) times daily as needed (pain).  01/24/20  Yes [provider]  sevelamer carbonate (RENVELA) 800 MG tablet Take 2,400 mg by mouth See admin instructions. Take 3 tablets (2400 mg) by mouth up to three times daily with meals   Yes [provider]  tenofovir (VIREAD) 300 MG tablet Take 300 mg by mouth every Monday.  01/03/20  Yes [provider]    Allergies    Tramadol, Morphine and related, Pollen extract, Acetaminophen, Aspirin, and Clonidine derivatives  Review of Systems   Review of Systems  Constitutional: Negative for chills, diaphoresis and fever.  Respiratory: Positive for shortness of breath. Negative for cough.   Cardiovascular: Positive for palpitations. Negative for chest pain and leg swelling.  Gastrointestinal: Negative for abdominal pain, diarrhea, nausea and vomiting.  Neurological: Negative for dizziness and syncope.  All other systems reviewed and are negative.   Physical Exam Updated Vital Signs BP 114/66   Temp 98.1 F (36.7 C) (Oral)   Resp (!) 36   SpO2 99%  Pulse rate at 90 and regular upon arrival in ED.   Physical Exam Vitals and nursing note reviewed.  Constitutional:      General: He is not in acute distress.    Appearance: He is well-developed. He is not diaphoretic.  HENT:     Head: Normocephalic and atraumatic.     Mouth/Throat:     Mouth: Mucous membranes are moist.     Pharynx: Oropharynx is clear.  Eyes:     Conjunctiva/sclera: Conjunctivae normal.  Cardiovascular:     Rate and Rhythm: Normal rate and regular rhythm.     Pulses: Normal pulses.          Radial pulses are 2+ on the right side and 2+ on the left side.       Posterior tibial pulses are 2+ on the right side and 2+ on the left side.     Heart sounds: Normal heart sounds.     Comments: Tactile temperature in the extremities  appropriate and equal bilaterally. Pulmonary:     Effort: Pulmonary effort is normal. No respiratory distress.     Breath sounds: Normal breath sounds.     Comments: When patient's pulse rises, he is noted to have increased work of breathing and tachypnea. Abdominal:     Palpations: Abdomen is soft.     Tenderness: There is no abdominal tenderness. There is no guarding.  Musculoskeletal:     Cervical back: Neck supple.     Right lower leg: No edema.     Left lower leg: No edema.  Lymphadenopathy:     Cervical: No cervical adenopathy.  Skin:    General: Skin is warm and dry.  Neurological:     Mental Status: He is alert.  Psychiatric:        Mood and Affect: Mood and affect normal.        Speech: Speech normal.        Behavior: Behavior normal.     ED Results / Procedures / Treatments   Labs (all labs ordered are listed, but only abnormal results are displayed) Labs Reviewed  COMPREHENSIVE METABOLIC PANEL - Abnormal; Notable for the following components:      Result Value  Sodium 133 (*)    Potassium 5.5 (*)    Chloride 91 (*)    BUN 56 (*)    Creatinine, Ser 4.26 (*)    Total Protein 5.8 (*)    Albumin 2.6 (*)    AST 56 (*)    ALT 97 (*)    Alkaline Phosphatase 219 (*)    Total Bilirubin 1.8 (*)    GFR calc non Af Amer 14 (*)    GFR calc Af Amer 17 (*)    Anion gap 17 (*)    All other components within normal limits  CBC WITH DIFFERENTIAL/PLATELET - Abnormal; Notable for the following components:   WBC 19.8 (*)    RBC 3.75 (*)    Hemoglobin 10.4 (*)    HCT 30.6 (*)    RDW 17.3 (*)    Platelets 144 (*)    Neutro Abs 15.7 (*)    Monocytes Absolute 2.3 (*)    Abs Immature Granulocytes 0.23 (*)    All other components within normal limits  SARS CORONAVIRUS 2 BY RT PCR (HOSPITAL ORDER, [facility])  MAGNESIUM    EKG EKG Interpretation  Date/Time:  Saturday February 09 2020 13:43:26 EDT Ventricular Rate:  91 PR Interval:    QRS  Duration: 177 QT Interval:  452 QTC Calculation: 557 R Axis:   134 Text Interpretation: Ectopic atrial rhythm Consider left ventricular hypertrophy ST depr, consider ischemia, inferior leads Prolonged QT interval Since last tracing QT has lengthened Otherwise no significant change Confirmed by Daleen Bo [phone]) on 02/09/2020 2:11:23 PM   Radiology DG Chest 2 View  Result Date: 02/09/2020 CLINICAL DATA:  59 year old with palpitations, generalized weakness and shortness of breath. The patient was seen in the emergency department yesterday for atrial fibrillation with rapid ventricular response, and recurrence of the atrial fibrillation was noted upon EMS arrival. Patient had successful cardioversion with Cardizem after unsuccessful attempts with adenosine. EXAM: CHEST - 2 VIEW COMPARISON:  02/08/2020 and earlier. FINDINGS: Cardiac silhouette markedly enlarged, unchanged. Thoracic aorta and proximal great vessels severely atherosclerotic. Prominent central pulmonary arteries, unchanged. Lungs clear. Mild pulmonary venous hypertension without overt edema. No visible pleural effusions. IMPRESSION: Stable marked cardiomegaly. No acute cardiopulmonary disease. Electronically Signed   By: Evangeline Dakin M.D.   On: 02/09/2020 14:40   DG Chest Port 1 View  Result Date: 02/08/2020 CLINICAL DATA:  V-tach EXAM: PORTABLE CHEST 1 VIEW COMPARISON:  February 03, 2020 FINDINGS: Again noted is mild cardiomegaly. Aortic knob calcifications. There is prominence of the central pulmonary vasculature. No large airspace consolidation or pleural effusion. There is stable elevation of the left hemidiaphragm. IMPRESSION: Mild cardiomegaly and pulmonary vascular congestion. Electronically Signed   By: Prudencio Pair M.D.   On: 02/08/2020 22:30    Procedures .Critical Care Performed by: Lorayne Bender, PA-C Authorized by: Lorayne Bender, PA-C   Critical care provider statement:    Critical care time (minutes):  45   Critical  care was necessary to treat or prevent imminent or life-threatening deterioration of the following conditions: Arrhythmia.   Critical care was time spent personally by me on the following activities:  Ordering and performing treatments and interventions, ordering and review of laboratory studies, ordering and review of radiographic studies, pulse oximetry, re-evaluation of patient's condition, obtaining history from patient or surrogate, examination of patient, evaluation of patient's response to treatment, discussions with consultants, development of treatment plan with patient or surrogate and review of old charts  I assumed direction of critical care for this patient from another provider in my specialty: no     (including critical care time)  Medications Ordered in ED Medications  diltiazem (CARDIZEM) 1 mg/mL load via infusion 10 mg (10 mg Intravenous Bolus from Bag 02/09/20 1536)    And  diltiazem (CARDIZEM) 125 mg in dextrose 5% 125 mL (1 mg/mL) infusion (5 mg/hr Intravenous New Bag/Given 02/09/20 1536)  heparin injection 2,800 Units (has no administration in time range)  0.9 %  sodium chloride infusion (has no administration in time range)  0.9 %  sodium chloride infusion (has no administration in time range)  alteplase (CATHFLO ACTIVASE) injection 2 mg (has no administration in time range)  heparin injection 1,000 Units (has no administration in time range)  lidocaine (PF) (XYLOCAINE) 1 % injection 5 mL (has no administration in time range)  lidocaine-prilocaine (EMLA) cream 1 application (has no administration in time range)  pentafluoroprop-tetrafluoroeth (GEBAUERS) aerosol 1 application (has no administration in time range)  0.9 %  sodium chloride infusion (has no administration in time range)  0.9 %  sodium chloride infusion (has no administration in time range)  lidocaine (PF) (XYLOCAINE) 1 % injection 5 mL (has no administration in time range)  lidocaine-prilocaine (EMLA) cream 1  application (has no administration in time range)  pentafluoroprop-tetrafluoroeth (GEBAUERS) aerosol 1 application (has no administration in time range)  0.9 %  sodium chloride infusion (has no administration in time range)  0.9 %  sodium chloride infusion (has no administration in time range)  alteplase (CATHFLO ACTIVASE) injection 2 mg (has no administration in time range)  heparin injection 1,000 Units (has no administration in time range)  lidocaine (PF) (XYLOCAINE) 1 % injection 5 mL (has no administration in time range)  lidocaine-prilocaine (EMLA) cream 1 application (has no administration in time range)  pentafluoroprop-tetrafluoroeth (GEBAUERS) aerosol 1 application (has no administration in time range)  oxyCODONE (Oxy IR/ROXICODONE) immediate release tablet 10 mg (10 mg Oral Given 02/09/20 1609)    ED Course  I have reviewed the triage vital signs and the nursing notes.  Pertinent labs & imaging results that were available during my care of the patient were reviewed by me and considered in my medical decision making (see chart for details).  Clinical Course as of Feb 08 1705  Sat Feb 09, 2020  1510 RN notified me that the patient's pulse is beginning to rise again.  I reevaluated the patient and noted his pulse rate to be 130-140, irregular.   [SJ]  [id number] Spoke with Dr. Einar Gip, cardiology.  Medicine admission, he will continue to consult.  No anticoagulation.  States he may trial the patient on digoxin to see how he does.   [SJ]  1618 Repeat BP 101/64. Pulse rate at 90.  BP(!): 97/64 [SJ]  1643 Spoke with Dr. Neysa Bonito, hospitalist. Agrees to admit the patient.    [SJ]    Clinical Course User Index [SJ] Joy, Helane Gunther, PA-C   MDM Rules/Calculators/A&P                          Patient presents with palpitations and shortness of breath. A. fib RVR with EMS. Rate controlled with diltiazem. Recurrence of A. fib RVR here in the ED, requiring more diltiazem. Admission for further  management. Cardiology consulting.  I personally reviewed and interpreted the patient's labs and imaging studies.   Findings and plan of care discussed with Daleen Bo, MD. Dr. Eulis Foster  personally evaluated and examined this patient.    Vitals:   02/09/20 1626 02/09/20 1627 02/09/20 1628 02/09/20 1630  BP:    116/69  Resp: 19 22 21 17   Temp:      TempSrc:      SpO2:       Patient has been seen multiple times in the last few days for complaints ranging from shortness of breath, chest pain, A. fib RVR.  He has had multiple instances of hyperkalemia.  There is a theme of medication noncompliance throughout the notes. It looks as though Dr. Einar Gip, cardiologist, had to recently reinitiate patient's metoprolol, diltiazem, and amiodarone.  He also notes patient is not anticoagulated due to his liver dysfunction and warfarin noncompliance.   Final Clinical Impression(s) / ED Diagnoses Final diagnoses:  Atrial fibrillation with RVR [facility] ([place]))    Rx / DC Orders ED Discharge Orders    None       Layla Maw 02/09/20 1707    Lorayne Bender, PA-C 02/09/20 1708    Daleen Bo, MD 02/10/20 1536

## 2020-02-09 NOTE — ED Notes (Signed)
Pt is in  nsr now  Admitting doctor has been contacted about that  His order is to keep the cradizem where it is until his cardio;logist sees him he has converted once today and went right back into  af

## 2020-02-10 ENCOUNTER — Inpatient Hospital Stay (HOSPITAL_COMMUNITY): Payer: Medicare Other

## 2020-02-10 ENCOUNTER — Encounter (HOSPITAL_COMMUNITY): Payer: Self-pay | Admitting: Internal Medicine

## 2020-02-10 ENCOUNTER — Other Ambulatory Visit: Payer: Self-pay

## 2020-02-10 DIAGNOSIS — Z7189 Other specified counseling: Secondary | ICD-10-CM

## 2020-02-10 DIAGNOSIS — I4891 Unspecified atrial fibrillation: Secondary | ICD-10-CM

## 2020-02-10 DIAGNOSIS — Z66 Do not resuscitate: Secondary | ICD-10-CM

## 2020-02-10 DIAGNOSIS — Z515 Encounter for palliative care: Secondary | ICD-10-CM

## 2020-02-10 LAB — CBC
HCT: 27.8 % — ABNORMAL LOW (ref 39.0–52.0)
Hemoglobin: 9.8 g/dL — ABNORMAL LOW (ref 13.0–17.0)
MCH: 28.2 pg (ref 26.0–34.0)
MCHC: 35.3 g/dL (ref 30.0–36.0)
MCV: 80.1 fL (ref 80.0–100.0)
Platelets: 147 10*3/uL — ABNORMAL LOW (ref 150–400)
RBC: 3.47 MIL/uL — ABNORMAL LOW (ref 4.22–5.81)
RDW: 16.9 % — ABNORMAL HIGH (ref 11.5–15.5)
WBC: 14.6 10*3/uL — ABNORMAL HIGH (ref 4.0–10.5)
nRBC: 0 % (ref 0.0–0.2)

## 2020-02-10 LAB — BASIC METABOLIC PANEL
Anion gap: 16 — ABNORMAL HIGH (ref 5–15)
BUN: 69 mg/dL — ABNORMAL HIGH (ref 6–20)
CO2: 26 mmol/L (ref 22–32)
Calcium: 9.1 mg/dL (ref 8.9–10.3)
Chloride: 90 mmol/L — ABNORMAL LOW (ref 98–111)
Creatinine, Ser: 5.09 mg/dL — ABNORMAL HIGH (ref 0.61–1.24)
GFR calc Af Amer: 13 mL/min — ABNORMAL LOW (ref >60)
GFR calc non Af Amer: 12 mL/min — ABNORMAL LOW (ref >60)
Glucose, Bld: 77 mg/dL (ref 70–99)
Potassium: 6.3 mmol/L (ref 3.5–5.1)
Sodium: 132 mmol/L — ABNORMAL LOW (ref 135–145)

## 2020-02-10 MED ORDER — SEVELAMER CARBONATE 800 MG PO TABS
3200.0000 mg | ORAL_TABLET | Freq: Three times a day (TID) | ORAL | Status: DC
  Administered 2020-02-10 – 2020-02-13 (×8): 3200 mg via ORAL
  Filled 2020-02-10 (×8): qty 4

## 2020-02-10 MED ORDER — LIDOCAINE HCL (PF) 1 % IJ SOLN
INTRAMUSCULAR | Status: AC
  Filled 2020-02-10: qty 30

## 2020-02-10 MED ORDER — SODIUM ZIRCONIUM CYCLOSILICATE 10 G PO PACK
10.0000 g | PACK | Freq: Once | ORAL | Status: AC
  Administered 2020-02-10: 10 g via ORAL
  Filled 2020-02-10: qty 1

## 2020-02-10 MED ORDER — DIGOXIN 250 MCG PO TABS
0.2500 mg | ORAL_TABLET | ORAL | Status: AC
  Administered 2020-02-10 (×3): 0.25 mg via ORAL
  Filled 2020-02-10 (×3): qty 1

## 2020-02-10 MED ORDER — PROSOURCE PLUS PO LIQD
30.0000 mL | Freq: Two times a day (BID) | ORAL | Status: DC
  Administered 2020-02-11 – 2020-02-12 (×2): 30 mL via ORAL
  Filled 2020-02-10 (×8): qty 30

## 2020-02-10 MED ORDER — RENA-VITE PO TABS
1.0000 | ORAL_TABLET | Freq: Every day | ORAL | Status: DC
  Administered 2020-02-10 – 2020-02-12 (×3): 1 via ORAL
  Filled 2020-02-10 (×3): qty 1

## 2020-02-10 MED ORDER — DIGOXIN 125 MCG PO TABS
0.0625 mg | ORAL_TABLET | Freq: Every day | ORAL | Status: DC
  Administered 2020-02-11: 0.0625 mg via ORAL
  Filled 2020-02-10: qty 1

## 2020-02-10 MED ORDER — CHLORHEXIDINE GLUCONATE CLOTH 2 % EX PADS: 6.0000 | MEDICATED_PAD | Freq: Every day | CUTANEOUS | Status: DC

## 2020-02-10 MED ORDER — CALCITRIOL 0.5 MCG PO CAPS
0.5000 ug | ORAL_CAPSULE | ORAL | Status: DC
  Administered 2020-02-11: 0.5 ug via ORAL
  Filled 2020-02-10: qty 1

## 2020-02-10 MED ORDER — DILTIAZEM HCL ER COATED BEADS 120 MG PO CP24
120.0000 mg | ORAL_CAPSULE | Freq: Every day | ORAL | Status: DC
  Administered 2020-02-10 – 2020-02-13 (×4): 120 mg via ORAL
  Filled 2020-02-10 (×4): qty 1

## 2020-02-10 MED ORDER — CHLORHEXIDINE GLUCONATE CLOTH 2 % EX PADS
6.0000 | MEDICATED_PAD | Freq: Every day | CUTANEOUS | Status: DC
  Administered 2020-02-10 – 2020-02-12 (×3): 6 via TOPICAL

## 2020-02-10 NOTE — Progress Notes (Signed)
PT Cancellation Note  Patient Details Name: Gerome Kokesh MRN: [id number] DOB: [birth year]   Cancelled Treatment:    Reason Eval/Treat Not Completed: Patient declined, no reason specified. Pt declines mobility and PT evaluation, reporting "I don't feel like moving". PT attempts to encourage the pt however he remains adamant. PT will follow up when the pt is more willing to participate.   Zenaida Niece 02/10/2020, 3:45 PM

## 2020-02-10 NOTE — Consult Note (Addendum)
[facility] Renal Consultation Note    Indication for Consultation:  Management of ESRD/hemodialysis; anemia, hypertension/volume and secondary hyperparathyroidism PCP:  HPI: This is the sixth admission in 30 days for  [name], a 59 y.o. male with complex PMHx including ESRD, Hep B/C with cirrhosis, ascites, COPD , hx COVID recurrent afib and variable compliance including multiple AMA leaving from hospitalizations and ED visits. He is variably  compliant with attending dialysis though lately doing better staying on closer to full time.  The last time he was near EDW was late June.  He was last seen at dialysis Friday, July 30th feeling horribly, afebrile plus BP lower than usual.  He had had a recent para of 1 L and /co of cough and dysuria - BC x 2 were drawn and empiric Vanc and [name] were given.  He had a net UF of 5.6 recorded with post wt 67.6 (not sure that is accurate!) He was afebrile with recorded heart rates 47 - 109 .  He presented to the ED yesterday via EMS with SOB< dizziness and wide complex tachycardia.  HR was in the 160s and he was treated medically with adenosine x 2 then cardizem 20.  Labs upon arrival yesterday afternoon were remarkable for Na 133 K 5.5 Cr 4.26 LFT slightly ^ WBC 19.8 hgb 10.4   CXR showed no overt edema. Repeat CBC today WBC 14.6 hgb 9.8 plts 147  BMP is pending for today.  Today is minimally cooperative with exam reporting that he is tire and mostly mumbling.  He did say he came to ED yesterday for SOB. Noted to cough at times  Had recent reports of rectal bleeding.  Past Medical History:  Diagnosis Date  . Anemia   . Anxiety   . Arthritis    left shoulder  . Atherosclerosis of aorta ([place])   . Cardiomegaly   . Chest pain    DATE UNKNOWN, C/O PERIODICALLY  . Cocaine abuse ([place])   . COPD exacerbation ([place]) 08/17/2016  . Coronary artery disease    stent 02/22/17  . ESRD (end stage renal disease) on dialysis ([place])    "[facility]; MWF" (07/04/2017)  . GERD (gastroesophageal reflux disease)    DATE UNKNOWN  . Hemorrhoids   . Hepatitis B, chronic ([place])   . Hepatitis C   . History of kidney stones   . Hyperkalemia   . Hypertension   . Kidney failure   . Metabolic bone disease    Patient denies  . Mitral stenosis   . Myocardial infarction ([place])   . Pneumonia   . Pulmonary edema   . Solitary rectal ulcer syndrome 07/2017   at flex sig for rectal bleeding  . Tubular adenoma of colon    Past Surgical History:  Procedure Laterality Date  . A/V FISTULAGRAM Left 05/26/2017   Procedure: A/V FISTULAGRAM;  Surgeon: Conrad Ogdensburg, MD;  Location: [place] [facility];  Service: Cardiovascular;  Laterality: Left;  . A/V FISTULAGRAM Right 11/18/2017   Procedure: A/V FISTULAGRAM - Right Arm;  Surgeon: Elam Dutch, MD;  Location: [place] [facility];  Service: Cardiovascular;  Laterality: Right;  . APPLICATION OF WOUND VAC Left 06/14/2017   Procedure: APPLICATION OF WOUND VAC;  Surgeon: Katha Cabal, MD;  Location: [facility];  Service: Vascular;  Laterality: Left;  . AV FISTULA PLACEMENT  2012   BELIEVED WAS PLACED IN JUNE  . AV FISTULA PLACEMENT Right 08/09/2017   Procedure: Creation  Right arm ARTERIOVENOUS BRACHIOCEPOHALIC FISTULA;  Surgeon: Elam Dutch, MD;  Location: [facility] OR;  Service: Vascular;  Laterality: Right;  . AV FISTULA PLACEMENT Right 11/22/2017   Procedure: INSERTION OF ARTERIOVENOUS (AV) GORE-TEX GRAFT RIGHT UPPER ARM;  Surgeon: Elam Dutch, MD;  Location: [place];  Service: Vascular;  Laterality: Right;  . BIOPSY  01/25/2018   Procedure: BIOPSY;  Surgeon: Jerene Bears, MD;  Location: [place];  Service: Gastroenterology;;  . BIOPSY  04/10/2019   Procedure: BIOPSY;  Surgeon: Jerene Bears, MD;  Location: [facility];  Service: Gastroenterology;;  . COLONOSCOPY    . COLONOSCOPY WITH PROPOFOL N/A 01/25/2018   Procedure: COLONOSCOPY WITH PROPOFOL;  Surgeon: Jerene Bears, MD;   Location: [place];  Service: Gastroenterology;  Laterality: N/A;  . CORONARY STENT INTERVENTION N/A 02/22/2017   Procedure: CORONARY STENT INTERVENTION;  Surgeon: Nigel Mormon, MD;  Location: [place] [facility];  Service: Cardiovascular;  Laterality: N/A;  . ESOPHAGOGASTRODUODENOSCOPY (EGD) WITH PROPOFOL N/A 01/25/2018   Procedure: ESOPHAGOGASTRODUODENOSCOPY (EGD) WITH PROPOFOL;  Surgeon: Jerene Bears, MD;  Location: [place];  Service: Gastroenterology;  Laterality: N/A;  . ESOPHAGOGASTRODUODENOSCOPY (EGD) WITH PROPOFOL N/A 04/10/2019   Procedure: ESOPHAGOGASTRODUODENOSCOPY (EGD) WITH PROPOFOL;  Surgeon: Jerene Bears, MD;  Location: [facility];  Service: Gastroenterology;  Laterality: N/A;  . FLEXIBLE SIGMOIDOSCOPY N/A 07/15/2017   Procedure: FLEXIBLE SIGMOIDOSCOPY;  Surgeon: Carol Ada, MD;  Location: [place];  Service: Endoscopy;  Laterality: N/A;  . HEMORRHOID BANDING    . I & D EXTREMITY Left 06/01/2017   Procedure: IRRIGATION AND DEBRIDEMENT LEFT ARM HEMATOMA WITH LIGATION OF LEFT ARM AV FISTULA;  Surgeon: Elam Dutch, MD;  Location: [place];  Service: Vascular;  Laterality: Left;  . I & D EXTREMITY Left 06/14/2017   Procedure: IRRIGATION AND DEBRIDEMENT EXTREMITY;  Surgeon: Katha Cabal, MD;  Location: [facility];  Service: Vascular;  Laterality: Left;  . INSERTION OF DIALYSIS CATHETER  05/30/2017  . INSERTION OF DIALYSIS CATHETER N/A 05/30/2017   Procedure: INSERTION OF DIALYSIS CATHETER;  Surgeon: Elam Dutch, MD;  Location: [place];  Service: Vascular;  Laterality: N/A;  . IR PARACENTESIS  08/30/2017  . IR PARACENTESIS  09/29/2017  . IR PARACENTESIS  10/28/2017  . IR PARACENTESIS  11/09/2017  . IR PARACENTESIS  11/16/2017  . IR PARACENTESIS  11/28/2017  . IR PARACENTESIS  12/01/2017  . IR PARACENTESIS  12/06/2017  . IR PARACENTESIS  01/03/2018  . IR PARACENTESIS  01/23/2018  . IR PARACENTESIS  02/07/2018  . IR PARACENTESIS  02/21/2018  . IR PARACENTESIS   03/06/2018  . IR PARACENTESIS  03/17/2018  . IR PARACENTESIS  04/04/2018  . IR PARACENTESIS  12/28/2018  . IR PARACENTESIS  01/08/2019  . IR PARACENTESIS  01/23/2019  . IR PARACENTESIS  02/01/2019  . IR PARACENTESIS  02/19/2019  . IR PARACENTESIS  03/01/2019  . IR PARACENTESIS  03/15/2019  . IR PARACENTESIS  04/03/2019  . IR PARACENTESIS  04/12/2019  . IR PARACENTESIS  05/01/2019  . IR PARACENTESIS  05/08/2019  . IR PARACENTESIS  05/24/2019  . IR PARACENTESIS  06/12/2019  . IR PARACENTESIS  07/09/2019  . IR PARACENTESIS  07/27/2019  . IR PARACENTESIS  08/09/2019  . IR PARACENTESIS  08/21/2019  . IR PARACENTESIS  09/17/2019  . IR PARACENTESIS  10/05/2019  . IR PARACENTESIS  10/29/2019  . IR PARACENTESIS  11/08/2019  . IR PARACENTESIS  12/12/2019  . IR PARACENTESIS  01/03/2020  .  IR PARACENTESIS  01/10/2020  . IR PARACENTESIS  01/17/2020  . IR PARACENTESIS  01/24/2020  . IR PARACENTESIS  01/31/2020  . IR PARACENTESIS  02/07/2020  . IR RADIOLOGIST EVAL & MGMT  02/14/2018  . IR RADIOLOGIST EVAL & MGMT  02/22/2019  . LEFT HEART CATH AND CORONARY ANGIOGRAPHY N/A 02/22/2017   Procedure: LEFT HEART CATH AND CORONARY ANGIOGRAPHY;  Surgeon: Nigel Mormon, MD;  Location: [place] [facility];  Service: Cardiovascular;  Laterality: N/A;  . LIGATION OF ARTERIOVENOUS  FISTULA Left 7/0/2637   Procedure: Plication of Left Arm Arteriovenous Fistula;  Surgeon: Elam Dutch, MD;  Location: [place];  Service: Vascular;  Laterality: Left;  . POLYPECTOMY    . POLYPECTOMY  01/25/2018   Procedure: POLYPECTOMY;  Surgeon: Jerene Bears, MD;  Location: [place];  Service: Gastroenterology;;  . REVISON OF ARTERIOVENOUS FISTULA Left 8/58/8502   Procedure: PLICATION OF DISTAL ANEURYSMAL SEGEMENT OF LEFT UPPER ARM ARTERIOVENOUS FISTULA;  Surgeon: Elam Dutch, MD;  Location: [place];  Service: Vascular;  Laterality: Left;  . REVISON OF ARTERIOVENOUS FISTULA Left 7/74/1287   Procedure: Plication of Left Upper Arm Fistula ;   Surgeon: Waynetta Sandy, MD;  Location: [place];  Service: Vascular;  Laterality: Left;  . SKIN GRAFT SPLIT THICKNESS LEG / FOOT Left    SKIN GRAFT SPLIT THICKNESS LEFT ARM DONOR SITE: LEFT ANTERIOR THIGH  . SKIN SPLIT GRAFT Left 07/04/2017   Procedure: SKIN GRAFT SPLIT THICKNESS LEFT ARM DONOR SITE: LEFT ANTERIOR THIGH;  Surgeon: Elam Dutch, MD;  Location: [name];  Service: Vascular;  Laterality: Left;  . THROMBECTOMY W/ EMBOLECTOMY Left 06/05/2017   Procedure: EXPLORATION OF LEFT ARM FOR BLEEDING; OVERSEWED PROXIMAL FISTULA;  Surgeon: Angelia Mould, MD;  Location: [place];  Service: Vascular;  Laterality: Left;  . WOUND EXPLORATION Left 06/03/2017   Procedure: WOUND EXPLORATION WITH WOUND VAC APPLICATION TO LEFT ARM;  Surgeon: Angelia Mould, MD;  Location: [facility] OR;  Service: Vascular;  Laterality: Left;   Family History  Problem Relation Age of Onset  . Heart disease Mother   . Lung cancer Mother   . Heart disease Father   . Malignant hyperthermia Father   . COPD Father   . Throat cancer Sister   . Esophageal cancer Sister   . Hypertension Other   . COPD Other   . Colon cancer Neg Hx   . Colon polyps Neg Hx   . Rectal cancer Neg Hx   . Stomach cancer Neg Hx    Social History:  reports that he has been smoking cigarettes. He started smoking about 46 years ago. He has a 21.50 pack-year smoking history. He has never used smokeless tobacco. He reports previous alcohol use. He reports previous drug use. Drugs: Marijuana and Cocaine. Allergies  Allergen Reactions  . Tramadol Itching and Other (See Comments)  . Morphine And Related Other (See Comments)    Stomach pain  . Pollen Extract Other (See Comments)    Other reaction(s): Sneezing (finding)  . Acetaminophen Nausea Only    Stomach ache   . Aspirin Itching and Other (See Comments)    STOMACH PAIN   . Clonidine Derivatives Itching   Prior to Admission medications   Medication Sig Start Date End  Date Taking? Authorizing Provider  albuterol (VENTOLIN HFA) 108 (90 Base) MCG/ACT inhaler Inhale 2 puffs into the lungs every 4 (four) hours as needed for wheezing or shortness of breath.  02/03/20  Yes  [provider]  amiodarone (PACERONE) 200 MG tablet Take 1 tablet (200 mg total) by mouth daily. 01/29/20  Yes Shelly Coss, MD  diltiazem (CARDIZEM CD) 240 MG 24 hr capsule Take 240 mg by mouth daily. 01/29/20  Yes [provider]  diphenhydrAMINE (BENADRYL) 25 mg capsule Take 1 capsule (25 mg total) by mouth every 6 (six) hours as needed for itching or allergies. 01/29/20  Yes Shelly Coss, MD  gabapentin (NEURONTIN) 300 MG capsule Take 1 capsule (300 mg total) by mouth 2 (two) times daily. 01/23/20  Yes Adrian Prows, MD  metoprolol tartrate (LOPRESSOR) 25 MG tablet Take 0.5 tablets (12.5 mg total) by mouth 2 (two) times daily. Patient taking differently: Take 25 mg by mouth 2 (two) times daily.  01/29/20  Yes Shelly Coss, MD  Oxycodone HCl 10 MG TABS Take 10 mg by mouth 3 (three) times daily as needed (pain).  01/24/20  Yes [provider]  sevelamer carbonate (RENVELA) 800 MG tablet Take 2,400 mg by mouth See admin instructions. Take 3 tablets (2400 mg) by mouth up to three times daily with meals   Yes [provider]  tenofovir (VIREAD) 300 MG tablet Take 300 mg by mouth every Monday.  01/03/20  Yes [provider]   Current Facility-Administered Medications  Medication Dose Route Frequency Provider Last Rate Last Admin  . 0.9 %  sodium chloride infusion  100 mL Intravenous PRN Harold Hedge, MD      . 0.9 %  sodium chloride infusion  100 mL Intravenous PRN Harold Hedge, MD      . alteplase (CATHFLO ACTIVASE) injection 2 mg  2 mg Intracatheter Once PRN Harold Hedge, MD      . cefTRIAXone (ROCEPHIN) 1 g in sodium chloride 0.9 % 100 mL IVPB  1 g Intravenous Q24H Harold Hedge, MD   Stopped at 02/09/20 2234  . [START ON 02/11/2020] digoxin  (LANOXIN) tablet 0.0625 mg  0.0625 mg Oral Daily Adrian Prows, MD      . digoxin Fonnie Birkenhead) tablet 0.25 mg  0.25 mg Oral Q4H Adrian Prows, MD   0.25 mg at 02/10/20 0803  . diltiazem (CARDIZEM) 125 mg in dextrose 5% 125 mL (1 mg/mL) infusion  5-15 mg/hr Intravenous Continuous Harold Hedge, MD 10 mL/hr at 02/10/20 0511 10 mg/hr at 02/10/20 0511  . doxycycline (VIBRA-TABS) tablet 100 mg  100 mg Oral Q12H Harold Hedge, MD   100 mg at 02/09/20 2134  . gabapentin (NEURONTIN) capsule 100 mg  100 mg Oral TID Harold Hedge, MD   100 mg at 02/09/20 2134  . heparin injection 1,000 Units  1,000 Units Dialysis PRN Harold Hedge, MD      . heparin injection 2,800 Units  2,800 Units Dialysis PRN Harold Hedge, MD      . heparin injection 5,000 Units  5,000 Units Subcutaneous Q8H Harold Hedge, MD   5,000 Units at 02/10/20 0510  . lidocaine (PF) (XYLOCAINE) 1 % injection 5 mL  5 mL Intradermal PRN Harold Hedge, MD      . lidocaine-prilocaine (EMLA) cream 1 application  1 application Topical PRN Harold Hedge, MD      . oxyCODONE (Oxy IR/ROXICODONE) immediate release tablet 10 mg  10 mg Oral BID PRN Harold Hedge, MD   10 mg at 02/09/20 2026  . pentafluoroprop-tetrafluoroeth (GEBAUERS) aerosol 1 application  1 application Topical PRN Harold Hedge, MD      . [  START ON 02/11/2020] tenofovir (VIREAD) tablet 300 mg  300 mg Oral Q Jae Dire, MD       Labs: Basic Metabolic Panel: Recent Labs  Lab 02/05/20 0255 02/05/20 0255 02/08/20 0254 02/08/20 0254 02/08/20 2127 02/08/20 2147 02/09/20 1344  NA 134*   < > 132*   < > 131* 133* 133*  K 5.5*   < > 5.8*   < > 5.3* 5.2* 5.5*  CL 93*   < > 91*   < > 89* 90* 91*  CO2 28   < > 26  --  31  --  25  GLUCOSE 102*   < > 129*   < > 105* 105* 84  BUN 50*   < > 71*   < > 38* 46* 56*  CREATININE 4.27*   < > 4.41*   < > 3.03* 3.00* 4.26*  CALCIUM 9.6   < > 9.6  --  9.1  --  9.2  PHOS 4.6  --   --   --   --   --   --    < > = values in this interval not  displayed.   Liver Function Tests: Recent Labs  Lab 02/08/20 0254 02/08/20 2127 02/09/20 1344  AST 76* 65* 56*  ALT 137* 122* 97*  ALKPHOS 243* 250* 219*  BILITOT 1.3* 1.4* 1.8*  PROT 6.3* 6.6 5.8*  ALBUMIN 2.9* 2.9* 2.6*   No results for input(s): LIPASE, AMYLASE in the last 168 hours. No results for input(s): AMMONIA in the last 168 hours. CBC: Recent Labs  Lab 02/05/20 0255 02/05/20 0255 02/08/20 0254 02/08/20 0254 02/08/20 2127 02/08/20 2127 02/08/20 2147 02/09/20 1344 02/10/20 0307  WBC 14.3*   < > 16.9*   < > 15.0*  --   --  19.8* 14.6*  NEUTROABS 11.6*  --   --   --  12.2*  --   --  15.7*  --   HGB 10.7*   < > 11.1*   < > 11.3*   < > 12.9* 10.4* 9.8*  HCT 30.9*   < > 31.5*   < > 32.8*   < > 38.0* 30.6* 27.8*  MCV 78.8*  --  80.4  --  80.0  --   --  81.6 80.1  PLT 178   < > 186   < > 173  --   --  144* 147*   < > = values in this interval not displayed.   Cardiac Enzymes: No results for input(s): CKTOTAL, CKMB, CKMBINDEX, TROPONINI in the last 168 hours. CBG: Recent Labs  Lab 02/03/20 2241 02/05/20 0721  GLUCAP 134* 89   Iron Studies: No results for input(s): IRON, TIBC, TRANSFERRIN, FERRITIN in the last 72 hours. Studies/Results: DG Chest 2 View  Result Date: 02/09/2020 CLINICAL DATA:  59 year old with palpitations, generalized weakness and shortness of breath. The patient was seen in the emergency department yesterday for atrial fibrillation with rapid ventricular response, and recurrence of the atrial fibrillation was noted upon EMS arrival. Patient had successful cardioversion with Cardizem after unsuccessful attempts with adenosine. EXAM: CHEST - 2 VIEW COMPARISON:  02/08/2020 and earlier. FINDINGS: Cardiac silhouette markedly enlarged, unchanged. Thoracic aorta and proximal great vessels severely atherosclerotic. Prominent central pulmonary arteries, unchanged. Lungs clear. Mild pulmonary venous hypertension without overt edema. No visible pleural  effusions. IMPRESSION: Stable marked cardiomegaly. No acute cardiopulmonary disease. Electronically Signed   By: Evangeline Dakin M.D.   On: 02/09/2020 14:40  DG Chest Port 1 View  Result Date: 02/08/2020 CLINICAL DATA:  V-tach EXAM: PORTABLE CHEST 1 VIEW COMPARISON:  February 03, 2020 FINDINGS: Again noted is mild cardiomegaly. Aortic knob calcifications. There is prominence of the central pulmonary vasculature. No large airspace consolidation or pleural effusion. There is stable elevation of the left hemidiaphragm. IMPRESSION: Mild cardiomegaly and pulmonary vascular congestion. Electronically Signed   By: Prudencio Pair M.D.   On: 02/08/2020 22:30    ROS: As per HPI otherwise negative.  Physical Exam: Vitals:   02/10/20 0229 02/10/20 0356 02/10/20 0413 02/10/20 0700  BP: (!) 100/52 (!) 101/87  (!) 104/59  Pulse: 99 72  (!) 25  Resp: 16 18  20   Temp: 99.2 F (37.3 C) 98.7 F (37.1 C)  99.9 F (37.7 C)  TempSrc: Axillary Axillary  Axillary  SpO2: 96% 99%  95%  Weight:   70.3 kg   Height:         General: chronically ill appearing male MAD [place] O2 at mouth Head: NCAT sclera not icteric MMM Neck: Supple.  Lungs:  Refuses to sit up or roll to side for exam. Grossly clear Breathing is unlabored. Heart: irregular rate 90s  Abdomen:  Distended no sig tenderness + BS Lower extremities: tr LE edema  Neuro: Moves all extremities spontaneously. Psych:  irritable  Dialysis Access: right upper AVGG + bruit  Dialysis Orders: MonWedFri, 4 hrs 0 min, Optiflux 180NR, BFR 450, DFR Manual 800 mL/min, EDW 61 (kg), Dialysate 2.0 K, 2.0 Ca, 1.0 Mg, 100 Dextrose (G2201), Sodium 137 (mEq/L), Bicarb Setting: 35 (mEq/L), UFR Profile: None, Sodium Model: None, Access: AVGraft-Synthetic - [place] Acuseal..Marland KitchenNO HEPARIN no ESA or Fe calcitriol 0.5 Recent hgb 12  Ca low 10s P ^ ipTH 254   Assessment/Plan: 1. Shortness of breath with rapid afib - not taking prior amiodarone that was ordered - dig started - management  per cardiology - rate controlled this am - 90s - per Dr. Einar Gip 2. Hyperkalemia - tends to run high - no recent AF Labs this am still pending  - URRs checked 3 x in July but ran low and he had abbreviated treatments all 3 times.  Part of [place] is flat mid graft with out much of a thrill there -may need a shuntagram. Plan eval AVGG during treatments at Rx BFR and trend K/Cr in controlled environment. 3. ESRD -  MWF - due for dialysis tomorrow - given volume, low BP and ^ K (today's labs still pending favor brief HD today and again tomorrow to titrate volume back down. On no heparin HD 4. Hypotension/volume  - BP seems to limit UF - edema in LE better because he has been supine in bed.  Ascites makes knowing true EDW impossible. 5. Anemia  - hgb dropping compared with usual trends - watch - if remains below 10 will need to start ESA 6. Metabolic bone disease -  Hypercalcemic at dialysis - calcitriol just lowered 0.25 -correct Ca 10.2 - keep calcitriol as is for now - runs on 2 Ca bath 7. Nutrition - low alb -renal diet with supplements 8. COPD - finishing steroids  9. Chronic pain/tobacco abuse - per primary 10. Hep B/C cirrhosis/ ascites - paracentesis prn - last done 7/29 780 ml  Myriam Jacobson, PA-C [employer] [phone] 02/10/2020, 8:26 AM

## 2020-02-10 NOTE — Progress Notes (Signed)
Patient taken to HD in bed at 1555hrs. Sister at bedside and updated on plan of care.

## 2020-02-10 NOTE — Consult Note (Addendum)
Palliative Medicine Inpatient Consult Note  Reason for consult:  Goals of Care  HPI:  Per intake H&P --> 59 year old male who is a poor historian with past medical history of ESRD on dialysis MWF, COPD, prior alcohol abuse, A. fib with poor compliance(patient of [name]), cirrhosis with recurrent ascites and weekly paracentesis, chronic pain.  [name] has had 10 admissions and 17 ED visits in the last six months. He is known to the PMT service. He has OP Palliative Care through [facility] and was last seen on 12/13/2019   Clinical Assessment/Goals of Care: I have reviewed medical records including EPIC notes, labs and imaging, received report from bedside RN, assessed the patient who was noted to be very somnolent. He answered some basic questions though was disoriented and continued to drift off in conversation.    I called [name] (sister)  to further discuss diagnosis prognosis, GOC, EOL wishes, disposition and options.   I introduced Palliative Medicine as specialized medical care for people living with serious illness. It focuses on providing relief from the symptoms and stress of a serious illness. The goal is to improve quality of life for both the patient and the family.  I asked [name] to tell me about [name]. She shares that he has not been a very good man throughout much of his life. He is from [place], [place]. He has never been married and has a history of treating women very poorly. He has three sons who are intermittently involved in [name] life though Anthem has expressed that they are "no goo" to [facility] in the past. 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 He is a man of faith and practices within the [employer] denomination.  Prior to admission [name] was living on his own. Per [name] he was still driving and able to attend to all of his bADLs and take himself to HD. [name] shares  that he use to be very active until about thirteen years ago when he could not longer do the things he enjoyed secondary to HD complications. She states that now he "gets pain medications from a doctor in [place] and drugs himself." She thinks this could contribute to him not going to dialysis in addition to him living alone.  A detailed discussion was had today regarding advanced directives, [name] states being his HCPOA - this paperwork was requested.    Concepts specific to code status, artifical feeding and hydration, continued IV antibiotics and rehospitalization was had.    I completed a MOST form today. The patient and family outlined their wishes for the following treatment decisions:  Cardiopulmonary Resuscitation: Do Not Attempt Resuscitation (DNR/No CPR)  Medical Interventions: Limited Additional Interventions: Use medical treatment, IV fluids and cardiac monitoring as indicated, DO NOT USE intubation or mechanical ventilation. May consider use of less invasive airway support such as BiPAP or CPAP. Also provide comfort measures. Transfer to the hospital if indicated. Avoid intensive care.   Antibiotics: Determine use of limitation of antibiotics when infection occurs  IV Fluids: IV fluids for a defined trial period  Feeding Tube: Feeding tube for a defined trial period   The difference between a aggressive medical intervention path  and a palliative comfort care path for this patient at this time was had. Discussed the consideration of hospice moving forward.  I described hospice as a service for patients for have a life expectancy of <  59months. It preserves dignity and quality at the end phases of life. The focus changes from curative to symptom relief.   [name] states that she knows [name]'s time is limited. She expresses that she plans to speak with his family about his recurrent admissions. I shared with he that if [name] becomes more alert it would be of great value for [place] all to  have a family meeting to better understand if some of [name]'s compliance issues are related to him no longer wanting to continue HD.   [name] has a history of leaving AMA once feeling improved which can make follow up complicated. I will asked [facility] to follow up if such an event occurs so that these important conversations can be continued.  Discussed the importance of continued conversation with family and their  medical providers regarding overall plan of care and treatment options, ensuring decisions are within the context of the patients values and GOCs.  Decision Maker: [name] (Sister) - [phone]  SUMMARY OF RECOMMENDATIONS   DNAR/DNI  E-MOST completed  Treat what is treatable  Patient is already enrolled in OP Palliative care through [name] - Have message the liaison for resumption.  I shared with patients sister [name] that when [name] is more alert it would be reasonable to try to arrange a family meeting to determine if he still wants to continue with HD.   Code Status/Advance Care Planning: DNAR/DNI  Symptom Management:  Delirium:  Instill delirium precuations   Palliative Prophylaxis:   Oral Care, constipation, mobility  Additional Recommendations (Limitations, Scope, Preferences):  Treat what is treatable   Psycho-social/Spiritual:   Desire for further Chaplaincy support: Yes - [employer]  Additional Recommendations: Education on hospice   Prognosis: Extremely poor in the setting of noncompliance with HD  Discharge Planning: Discharge plan is not clear  PPS: 50%   This conversation/these recommendations were discussed with patient primary care team, Dr. Starla Link  Time In: 1200 Time Out: 1315 Total Time: 75 Greater than 50%  of this time was spent counseling and coordinating care related to the above assessment and plan.  [facility] Team Team Cell Phone: [phone] Please utilize secure  chat with additional questions, if there is no response within 30 minutes please call the above phone number  Palliative Medicine Team providers are available by phone from 7am to 7pm daily and can be reached through the team cell phone.  Should this patient require assistance outside of these hours, please call the patient's attending physician.

## 2020-02-10 NOTE — Progress Notes (Signed)
Reviewed his chart. We can load digoxin 0.25 mg Q4H for 3 doses and then start 0.0625 mg q daily and see if this will keep his heart rate controlled.   He was discharged home on Amiodarone, but he is not on it. He did not bring his home medications to his appointment.   Adrian Prows, MD, [facility] 02/10/2020, 6:55 AM Office: [phone]

## 2020-02-10 NOTE — Progress Notes (Signed)
[facility] ([facility]) Community Based Palliative Care     This patient is enrolled in our palliative care services in the community.  [facility] will continue to follow for any discharge planning needs and to coordinate continuation of palliative care.   If you have questions or need assistance, please call [phone] or contact the hospital Liaison listed on AMION.      Melissa O'Bryant, RN, BSN [facility] Hospital Liaison  [phone]   

## 2020-02-10 NOTE — Progress Notes (Signed)
Patient's BP 90/66, HR 86.  Patient denies dizziness/SOB or pain.  Dr. Einar Gip notified. Order to give po cardizem 120 mg and d/c cardizem gtt IV.

## 2020-02-10 NOTE — Progress Notes (Signed)
Patient presents for therapeutic paracentesis. [place] limited trace shows trace amount of peritoneal fluid. Insufficient amount to perform a safe paracentesis. Procedure was not performed.

## 2020-02-10 NOTE — Progress Notes (Signed)
AM labs show K 6.3 - he requires isolation dialysis due to hep B status.  Staff limitations prohibit immediate dialysis.  WIll temporize with a dose of lokelma and dialyze later today.  Amalia Hailey [facility] [phone]

## 2020-02-10 NOTE — Progress Notes (Signed)
CRITICAL VALUE ALERT  Critical Value:  K 6.3  Date & Time Notied:  02/10/20 at 0928hrs  Provider Notified: Dr. Starla Link  Orders Received/Actions taken: no new orders at this time. Patient for HD today.

## 2020-02-10 NOTE — Progress Notes (Signed)
Patient drowsy, but oriented when aroused.  Oxygen saturation 90% on 4L increased to 5L [place].  Dr. Starla Link notified of patient's drowsiness and increased oxygen needs.  Will continue to monitor for now.

## 2020-02-10 NOTE — Progress Notes (Signed)
   02/10/20 0048  Assess: MEWS Score  Temp 99.6 F (37.6 C)  BP (!) 107/50  Pulse Rate (!) 114  ECG Heart Rate (!) 111  Resp 18  Level of Consciousness Alert  SpO2 98 %  O2 Device Room Air  Patient Activity (if Appropriate) In bed  O2 Flow Rate (L/min) 2 L/min  Assess: MEWS Score  MEWS Temp 0  MEWS Systolic 0  MEWS Pulse 2  MEWS RR 0  MEWS LOC 0  MEWS Score 2  MEWS Score Color Yellow  Assess: if the MEWS score is Yellow or Red  Were vital signs taken at a resting state? Yes  Focused Assessment No change from prior assessment  Early Detection of Sepsis Score *See Row Information* Medium  MEWS guidelines implemented *See Row Information* Yes  Treat  MEWS Interventions Administered scheduled meds/treatments;Other (Comment) (patient on cardizem gtt - increased rate)  Take Vital Signs  Increase Vital Sign Frequency  Yellow: Q 2hr X 2 then Q 4hr X 2, if remains yellow, continue Q 4hrs  Escalate  MEWS: Escalate Yellow: discuss with charge nurse/RN and consider discussing with provider and RRT  Notify: Charge Nurse/RN  Name of Charge Nurse/RN Notified Jill, RN  Date Charge Nurse/RN Notified 02/10/20  Time Charge Nurse/RN Notified [phone]

## 2020-02-10 NOTE — Progress Notes (Signed)
Patient ID: [name], male   DOB: December 18, 1960, 59 y.o.   MRN: [id number]  PROGRESS NOTE    [name]  HBZ:[id number] DOB: [birth year] DOA: 02/09/2020 PCP: [name], FNP   Brief Narrative:  59 year old male who is a poor historian with past medical history of ESRD on dialysis MWF, COPD, prior alcohol abuse, A. fib with poor compliance (patient of Dr. Einar Gip), cirrhosis with recurrent ascites and weekly paracentesis, chronic pain who was seen in the ED multiple times over the past week for diastolic heart failure exacerbation, A. fib, hyperkalemia most recently signed out [place] on 7/27 when he was being treated for A. fib with RVR and recently seen in the ED on 02/08/2020 after being brought in by ambulance for shortness of breath and dizziness with wide-complex tachycardia and given amiodarone at the time who went home and presented again on 02/09/2020 for shortness of breath and palpitations.  Upon EMS arrival, his heart rate was in the 160s and received 6 mg followed by 12 mg of adenosine with no change in his rate and subsequently required 20 mg of Cardizem.  In the ED, he was still tachycardic up to 140s.  Cardiology/Dr. Einar Gip was consulted.  Chest x-ray showed mild cardiomegaly with no acute cardiopulmonary disease.  He was started on Cardizem drip.  Nephrology was also consulted.  Assessment & Plan:   Paroxysmal A. fib with RVR -Patient received Eliquis and 6 mg and 12 mg by EMS without improvement and subsequently was started on Cardizem drip.  Heart rate improving.  Oral digoxin has been added by Dr. Einar Gip.  Await formal consultation and recommendations by Dr. Einar Gip.  Blood pressure on the lower side. -Patient has had multiple recent ER presentations and hospital admission and subsequent leaving AMA   End-stage renal disease on hemodialysis Hyperkalemia -Nephrology following.  Planning for hemodialysis today.  Patient will be given Lokelma again today as per  nephrology  Chronic hepatitis B and hepatitis C with decompensated liver cirrhosis and recurrent ascites  -Weekly paracentesis.  Ultrasound guided paracentesis has been ordered by admitting provider. -Continue tenofovir  Concern for community-acquired pneumonia Acute hypoxic respiratory failure -Patient presented with leukocytosis, was tachycardic and required supplemental oxygen on presentation.  Still requiring 4 L oxygen.  Unclear if this is secondary to fluid overload versus pneumonia.  Currently on empiric Rocephin and doxycycline.  WBCs improving.  Leukocytosis -Improving  COPD -will add Dulera.  Continue nebs.  Recently had been treated with prednisone  Thrombocytopenia -Questionable cause.  No signs of bleeding  Anemia of chronic disease -From renal failure.  Hemoglobin stable  Chronic pain syndrome -Continue gabapentin along with as needed OxyContin  Tobacco abuse -Patient will need tobacco cessation  Generalized deconditioning - will need PT evaluation once more stable -Overall prognosis is very poor -We will consult palliative care for goals of care discussion  Concern for acute on chronic diastolic heart failure exacerbation -Cardiology has been consulted.  Volume managed by dialysis.    DVT prophylaxis: Heparin Code Status: Full Family Communication: None at bedside Disposition Plan: Status is: Inpatient  Remains inpatient appropriate because:Inpatient level of care appropriate due to severity of illness   Dispo: The patient is from: Home              Anticipated d/c is to: Home              Anticipated d/c date is: 2 days  Patient currently is not medically stable to d/c.   Consultants: Cardiology/nephrology  Procedures: None  Antimicrobials: Rocephin/doxycycline Tenofovir being continued   Subjective: Patient seen and examined at bedside.  He is sleepy, wakes up very slightly, hardly answers any questions.  No overnight fever,  vomiting reported.  Objective: Vitals:   02/10/20 0413 02/10/20 0700 02/10/20 0749 02/10/20 1013  BP:  (!) 104/59 (!) 104/59 (!) 99/60  Pulse:  (!) 25  88  Resp:  20 20   Temp:  99.9 F (37.7 C)    TempSrc:  Axillary    SpO2:  95%  94%  Weight: 70.3 kg     Height:        Intake/Output Summary (Last 24 hours) at 02/10/2020 1058 Last data filed at 02/10/2020 1000 Gross per 24 hour  Intake 247.01 ml  Output 600 ml  Net -352.99 ml   Filed Weights   02/09/20 1859 02/10/20 0413  Weight: 69.2 kg 70.3 kg    Examination:  General exam: Appears chronically ill. Respiratory system: Bilateral decreased breath sounds at bases with scattered crackles Cardiovascular system: S1 & S2 heard, currently rate controlled Gastrointestinal system: Abdomen is distended, soft and nontender. Normal bowel sounds heard. Extremities: No cyanosis, clubbing; bilateral lower extremity edema present Central nervous system: Sleepy, wakes up slightly, will answer some questions.  No focal neurological deficits. Moving extremities Skin: No rashes, lesions or ulcers Psychiatry: Could not be assessed because of mental status    Data Reviewed: I have personally reviewed following labs and imaging studies  CBC: Recent Labs  Lab 02/05/20 0255 02/05/20 0255 02/08/20 0254 02/08/20 2127 02/08/20 2147 02/09/20 1344 02/10/20 0307  WBC 14.3*  --  16.9* 15.0*  --  19.8* 14.6*  NEUTROABS 11.6*  --   --  12.2*  --  15.7*  --   HGB 10.7*   < > 11.1* 11.3* 12.9* 10.4* 9.8*  HCT 30.9*   < > 31.5* 32.8* 38.0* 30.6* 27.8*  MCV 78.8*  --  80.4 80.0  --  81.6 80.1  PLT 178  --  186 173  --  144* 147*   < > = values in this interval not displayed.   Basic Metabolic Panel: Recent Labs  Lab 02/05/20 0255 02/05/20 0255 02/08/20 0254 02/08/20 2127 02/08/20 2147 02/09/20 1344 02/09/20 1905 02/10/20 0307  NA 134*   < > 132* 131* 133* 133*  --  132*  K 5.5*   < > 5.8* 5.3* 5.2* 5.5*  --  6.3*  CL 93*   < > 91*  89* 90* 91*  --  90*  CO2 28  --  26 31  --  25  --  26  GLUCOSE 102*   < > 129* 105* 105* 84  --  77  BUN 50*   < > 71* 38* 46* 56*  --  69*  CREATININE 4.27*   < > 4.41* 3.03* 3.00* 4.26*  --  5.09*  CALCIUM 9.6  --  9.6 9.1  --  9.2  --  9.1  MG  --   --   --   --   --   --  2.3  --   PHOS 4.6  --   --   --   --   --   --   --    < > = values in this interval not displayed.   GFR: Estimated Creatinine Clearance: 15.9 mL/min (A) (by C-G formula based on SCr of 5.09  mg/dL (H)). Liver Function Tests: Recent Labs  Lab 02/03/20 2013 02/05/20 0255 02/08/20 0254 02/08/20 2127 02/09/20 1344  AST 93*  --  76* 65* 56*  ALT 151*  --  137* 122* 97*  ALKPHOS 271*  --  243* 250* 219*  BILITOT 1.3*  --  1.3* 1.4* 1.8*  PROT 6.5  --  6.3* 6.6 5.8*  ALBUMIN 2.8* 2.7* 2.9* 2.9* 2.6*   No results for input(s): LIPASE, AMYLASE in the last 168 hours. No results for input(s): AMMONIA in the last 168 hours. Coagulation Profile: Recent Labs  Lab 02/08/20 2127  INR 1.1   Cardiac Enzymes: No results for input(s): CKTOTAL, CKMB, CKMBINDEX, TROPONINI in the last 168 hours. BNP (last 3 results) No results for input(s): PROBNP in the last 8760 hours. HbA1C: No results for input(s): HGBA1C in the last 72 hours. CBG: Recent Labs  Lab 02/03/20 2241 02/05/20 0721  GLUCAP 134* 89   Lipid Profile: No results for input(s): CHOL, HDL, LDLCALC, TRIG, CHOLHDL, LDLDIRECT in the last 72 hours. Thyroid Function Tests: No results for input(s): TSH, T4TOTAL, FREET4, T3FREE, THYROIDAB in the last 72 hours. Anemia Panel: No results for input(s): VITAMINB12, FOLATE, FERRITIN, TIBC, IRON, RETICCTPCT in the last 72 hours. Sepsis Labs: No results for input(s): PROCALCITON, LATICACIDVEN in the last 168 hours.  Recent Results (from the past 240 hour(s))  SARS Coronavirus 2 by RT PCR (hospital order, performed in [facility] hospital lab) Nasopharyngeal Nasopharyngeal Swab     Status: None   Collection Time:  02/03/20  9:25 PM   Specimen: Nasopharyngeal Swab  Result Value Ref Range Status   SARS Coronavirus 2 NEGATIVE NEGATIVE Final    Comment: (NOTE) SARS-CoV-2 target nucleic acids are NOT DETECTED.  The SARS-CoV-2 RNA is generally detectable in upper and lower respiratory specimens during the acute phase of infection. The lowest concentration of SARS-CoV-2 viral copies this assay can detect is 250 copies / mL. A negative result does not preclude SARS-CoV-2 infection and should not be used as the sole basis for treatment or other patient management decisions.  A negative result may occur with improper specimen collection / handling, submission of specimen other than nasopharyngeal swab, presence of viral mutation(s) within the areas targeted by this assay, and inadequate number of viral copies (<250 copies / mL). A negative result must be combined with clinical observations, patient history, and epidemiological information.  Fact Sheet for Patients:   StrictlyIdeas.no  Fact Sheet for Healthcare Providers: BankingDealers.co.za  This test is not yet approved or  cleared by the [place] FDA and has been authorized for detection and/or diagnosis of SARS-CoV-2 by FDA under an Emergency Use Authorization (EUA).  This EUA will remain in effect (meaning this test can be used) for the duration of the COVID-19 declaration under Section 564(b)(1) of the Act, 21 U.S.C. section 360bbb-3(b)(1), unless the authorization is terminated or revoked sooner.  Performed at [facility], [place] [address] [place], [place] [address]   Culture, blood (routine x 2)     Status: None   Collection Time: 02/04/20  9:10 AM   Specimen: BLOOD LEFT WRIST  Result Value Ref Range Status   Specimen Description BLOOD LEFT WRIST  Final   Special Requests   Final    BOTTLES DRAWN AEROBIC AND ANAEROBIC Blood Culture adequate volume   Culture   Final    NO GROWTH  5 DAYS Performed at [facility], [place] [address] [place], [place] [address]  Report Status 02/09/2020 FINAL  Final  Culture, blood (routine x 2)     Status: None   Collection Time: 02/04/20  9:30 AM   Specimen: BLOOD RIGHT HAND  Result Value Ref Range Status   Specimen Description BLOOD RIGHT HAND  Final   Special Requests   Final    BOTTLES DRAWN AEROBIC AND ANAEROBIC Blood Culture results may not be optimal due to an inadequate volume of blood received in culture bottles   Culture   Final    NO GROWTH 5 DAYS Performed at [facility], [place] [address], [place], [place] [address]    Report Status 02/09/2020 FINAL  Final  SARS Coronavirus 2 by RT PCR (hospital order, performed in [facility] hospital lab) Nasopharyngeal Nasopharyngeal Swab     Status: None   Collection Time: 02/09/20  3:20 PM   Specimen: Nasopharyngeal Swab  Result Value Ref Range Status   SARS Coronavirus 2 NEGATIVE NEGATIVE Final    Comment: (NOTE) SARS-CoV-2 target nucleic acids are NOT DETECTED.  The SARS-CoV-2 RNA is generally detectable in upper and lower respiratory specimens during the acute phase of infection. The lowest concentration of SARS-CoV-2 viral copies this assay can detect is 250 copies / mL. A negative result does not preclude SARS-CoV-2 infection and should not be used as the sole basis for treatment or other patient management decisions.  A negative result may occur with improper specimen collection / handling, submission of specimen other than nasopharyngeal swab, presence of viral mutation(s) within the areas targeted by this assay, and inadequate number of viral copies (<250 copies / mL). A negative result must be combined with clinical observations, patient history, and epidemiological information.  Fact Sheet for Patients:   StrictlyIdeas.no  Fact Sheet for Healthcare Providers: BankingDealers.co.za  This test is not yet  approved or  cleared by the [place] FDA and has been authorized for detection and/or diagnosis of SARS-CoV-2 by FDA under an Emergency Use Authorization (EUA).  This EUA will remain in effect (meaning this test can be used) for the duration of the COVID-19 declaration under Section 564(b)(1) of the Act, 21 U.S.C. section 360bbb-3(b)(1), unless the authorization is terminated or revoked sooner.  Performed at [facility], [place] [address], [place], [place] [address]          Radiology Studies: DG Chest 2 View  Result Date: 02/09/2020 CLINICAL DATA:  59 year old with palpitations, generalized weakness and shortness of breath. The patient was seen in the emergency department yesterday for atrial fibrillation with rapid ventricular response, and recurrence of the atrial fibrillation was noted upon EMS arrival. Patient had successful cardioversion with Cardizem after unsuccessful attempts with adenosine. EXAM: CHEST - 2 VIEW COMPARISON:  02/08/2020 and earlier. FINDINGS: Cardiac silhouette markedly enlarged, unchanged. Thoracic aorta and proximal great vessels severely atherosclerotic. Prominent central pulmonary arteries, unchanged. Lungs clear. Mild pulmonary venous hypertension without overt edema. No visible pleural effusions. IMPRESSION: Stable marked cardiomegaly. No acute cardiopulmonary disease. Electronically Signed   By: Evangeline Dakin M.D.   On: 02/09/2020 14:40   US Abdomen Limited  Result Date: 02/10/2020 CLINICAL DATA:  History of cirrhosis. Please perform ascites search ultrasound and ultrasound-guided paracentesis for therapeutic purposes as indicated. EXAM: LIMITED ABDOMEN ULTRASOUND FOR ASCITES TECHNIQUE: Limited ultrasound survey for ascites was performed in all four abdominal quadrants. COMPARISON:  Multiple previous ultrasound-guided paracenteses, most recently on 02/07/2020 yielding 780 cc of peritoneal fluid. FINDINGS: Sonographic evaluation of the abdomen  demonstrates a trace amount of  intra-abdominal ascites, too small to allow for safe ultrasound-guided paracentesis. No paracentesis attempted. IMPRESSION: Trace amount of intra-abdominal ascites, too small to allow for safe ultrasound-guided paracentesis. Electronically Signed   By: Sandi Mariscal M.D.   On: 02/10/2020 10:15   DG Chest Port 1 View  Result Date: 02/08/2020 CLINICAL DATA:  V-tach EXAM: PORTABLE CHEST 1 VIEW COMPARISON:  February 03, 2020 FINDINGS: Again noted is mild cardiomegaly. Aortic knob calcifications. There is prominence of the central pulmonary vasculature. No large airspace consolidation or pleural effusion. There is stable elevation of the left hemidiaphragm. IMPRESSION: Mild cardiomegaly and pulmonary vascular congestion. Electronically Signed   By: Prudencio Pair M.D.   On: 02/08/2020 22:30        Scheduled Meds: . (feeding supplement) PROSource Plus  30 mL Oral BID BM  . [START ON 02/11/2020] calcitRIOL  0.5 mcg Oral Q M,W,F-HD  . Chlorhexidine Gluconate Cloth  6 each Topical Q0600  . [START ON 02/11/2020] digoxin  0.0625 mg Oral Daily  . digoxin  0.25 mg Oral Q4H  . doxycycline  100 mg Oral Q12H  . gabapentin  100 mg Oral TID  . heparin  5,000 Units Subcutaneous Q8H  . multivitamin  1 tablet Oral QHS  . sevelamer carbonate  3,200 mg Oral TID WC  . sodium zirconium cyclosilicate  10 g Oral Once  . [START ON 02/11/2020] tenofovir  300 mg Oral Q Mon   Continuous Infusions: . sodium chloride    . sodium chloride    . cefTRIAXone (ROCEPHIN)  IV Stopped (02/09/20 2234)  . diltiazem (CARDIZEM) infusion 10 mg/hr (02/10/20 1000)          Aline August, MD [facility] 02/10/2020, 10:58 AM

## 2020-02-11 LAB — PHOSPHORUS: Phosphorus: 6.5 mg/dL — ABNORMAL HIGH (ref 2.5–4.6)

## 2020-02-11 LAB — CBC WITH DIFFERENTIAL/PLATELET
Abs Immature Granulocytes: 0.11 10*3/uL — ABNORMAL HIGH (ref 0.00–0.07)
Basophils Absolute: 0 10*3/uL (ref 0.0–0.1)
Basophils Relative: 0 %
Eosinophils Absolute: 0.2 10*3/uL (ref 0.0–0.5)
Eosinophils Relative: 2 %
HCT: 26.5 % — ABNORMAL LOW (ref 39.0–52.0)
Hemoglobin: 9.2 g/dL — ABNORMAL LOW (ref 13.0–17.0)
Immature Granulocytes: 1 %
Lymphocytes Relative: 8 %
Lymphs Abs: 1 10*3/uL (ref 0.7–4.0)
MCH: 27.5 pg (ref 26.0–34.0)
MCHC: 34.7 g/dL (ref 30.0–36.0)
MCV: 79.3 fL — ABNORMAL LOW (ref 80.0–100.0)
Monocytes Absolute: 1.6 10*3/uL — ABNORMAL HIGH (ref 0.1–1.0)
Monocytes Relative: 14 %
Neutro Abs: 8.9 10*3/uL — ABNORMAL HIGH (ref 1.7–7.7)
Neutrophils Relative %: 75 %
Platelets: 130 10*3/uL — ABNORMAL LOW (ref 150–400)
RBC: 3.34 MIL/uL — ABNORMAL LOW (ref 4.22–5.81)
RDW: 16.2 % — ABNORMAL HIGH (ref 11.5–15.5)
WBC: 11.8 10*3/uL — ABNORMAL HIGH (ref 4.0–10.5)
nRBC: 0 % (ref 0.0–0.2)

## 2020-02-11 LAB — BASIC METABOLIC PANEL
Anion gap: 12 (ref 5–15)
BUN: 40 mg/dL — ABNORMAL HIGH (ref 6–20)
CO2: 27 mmol/L (ref 22–32)
Calcium: 8.7 mg/dL — ABNORMAL LOW (ref 8.9–10.3)
Chloride: 96 mmol/L — ABNORMAL LOW (ref 98–111)
Creatinine, Ser: 4.15 mg/dL — ABNORMAL HIGH (ref 0.61–1.24)
GFR calc Af Amer: 17 mL/min — ABNORMAL LOW (ref >60)
GFR calc non Af Amer: 15 mL/min — ABNORMAL LOW (ref >60)
Glucose, Bld: 88 mg/dL (ref 70–99)
Potassium: 4.8 mmol/L (ref 3.5–5.1)
Sodium: 135 mmol/L (ref 135–145)

## 2020-02-11 LAB — TROPONIN I (HIGH SENSITIVITY)
Troponin I (High Sensitivity): 143 ng/L (ref <18)
Troponin I (High Sensitivity): 136 ng/L (ref <18)

## 2020-02-11 LAB — PROCALCITONIN: Procalcitonin: 3.73 ng/mL

## 2020-02-11 LAB — AMMONIA: Ammonia: 36 umol/L — ABNORMAL HIGH (ref 9–35)

## 2020-02-11 MED ORDER — SODIUM CHLORIDE 0.9 % IV SOLN
2.0000 g | INTRAVENOUS | Status: DC
  Administered 2020-02-11 – 2020-02-12 (×2): 2 g via INTRAVENOUS
  Filled 2020-02-11 (×3): qty 20

## 2020-02-11 MED ORDER — DIGOXIN 125 MCG PO TABS
0.0625 mg | ORAL_TABLET | ORAL | Status: DC
  Administered 2020-02-13: 0.0625 mg via ORAL
  Filled 2020-02-11: qty 1

## 2020-02-11 MED ORDER — MOMETASONE FURO-FORMOTEROL FUM 100-5 MCG/ACT IN AERO
2.0000 | INHALATION_SPRAY | Freq: Two times a day (BID) | RESPIRATORY_TRACT | Status: DC
  Administered 2020-02-11 – 2020-02-13 (×4): 2 via RESPIRATORY_TRACT
  Filled 2020-02-11: qty 8.8

## 2020-02-11 MED ORDER — LACTULOSE 10 GM/15ML PO SOLN
10.0000 g | Freq: Two times a day (BID) | ORAL | Status: DC
  Filled 2020-02-11 (×4): qty 15

## 2020-02-11 MED ORDER — DARBEPOETIN ALFA 60 MCG/0.3ML IJ SOSY
PREFILLED_SYRINGE | INTRAMUSCULAR | Status: AC
  Filled 2020-02-11: qty 0.3

## 2020-02-11 MED ORDER — DARBEPOETIN ALFA 60 MCG/0.3ML IJ SOSY
60.0000 ug | PREFILLED_SYRINGE | INTRAMUSCULAR | Status: DC
  Administered 2020-02-11: 60 ug via INTRAVENOUS
  Filled 2020-02-11: qty 0.3

## 2020-02-11 MED ORDER — IPRATROPIUM-ALBUTEROL 0.5-2.5 (3) MG/3ML IN SOLN: 3.0000 mL | RESPIRATORY_TRACT | Status: DC | PRN

## 2020-02-11 NOTE — Progress Notes (Signed)
Patient ID: [name], male   DOB: [birth year], 59 y.o.   MRN: [id number]  PROGRESS NOTE    Orvis Stann  EGB:[id number] DOB: 16-May-1961 DOA: 02/09/2020 PCP: [name], FNP   Brief Narrative:  59 year old male who is a poor historian with past medical history of ESRD on dialysis MWF, COPD, prior alcohol abuse, A. fib with poor compliance (patient of Dr. Einar Gip), cirrhosis with recurrent ascites and weekly paracentesis, chronic pain who was seen in the ED multiple times over the past week for diastolic heart failure exacerbation, A. fib, hyperkalemia most recently signed out [place] on 7/27 when he was being treated for A. fib with RVR and recently seen in the ED on 02/08/2020 after being brought in by ambulance for shortness of breath and dizziness with wide-complex tachycardia and given amiodarone at the time who went home and presented again on 02/09/2020 for shortness of breath and palpitations.  Upon EMS arrival, his heart rate was in the 160s and received 6 mg followed by 12 mg of adenosine with no change in his rate and subsequently required 20 mg of Cardizem.  In the ED, he was still tachycardic up to 140s.  Cardiology/Dr. Einar Gip was consulted.  Chest x-ray showed mild cardiomegaly with no acute cardiopulmonary disease.  He was started on Cardizem drip.  Nephrology was also consulted.  Assessment & Plan:   Paroxysmal A. fib with RVR -Patient received Eliquis and 6 mg and 12 mg by EMS without improvement and subsequently was started on Cardizem drip.  Heart rate improving.  Oral digoxin has been added by Dr. Einar Gip.  Await formal consultation and recommendations by Dr. Einar Gip.  Blood pressure on the lower side. -Patient has had multiple recent ER presentations and hospital admission and subsequent leaving AMA   End-stage renal disease on hemodialysis Hyperkalemia -Nephrology following.  Status post dialysis on 02/10/2020.  Potassium 4.8 today.  Chronic hepatitis B and hepatitis  C with decompensated liver cirrhosis and recurrent ascites with weekly paracentesis.  -Patient recently had ultrasound-guided paracentesis.  There is not enough fluid for safe paracentesis on 02/10/2020. -Continue tenofovir  Concern for community-acquired pneumonia Acute hypoxic respiratory failure -Patient presented with leukocytosis, was tachycardic and required supplemental oxygen on presentation.  Still requiring 4 L oxygen.  Currently on empiric Rocephin and doxycycline.  WBCs improving.  Leukocytosis -Improving  COPD -will add Dulera.  Continue nebs.  Recently had been treated with prednisone  Thrombocytopenia -Questionable cause.  No signs of bleeding  Anemia of chronic disease -From renal failure.  Hemoglobin stable  Chronic pain syndrome -Continue gabapentin along with as needed OxyContin  Tobacco abuse -Patient will need tobacco cessation  Generalized deconditioning - will need PT evaluation -Overall prognosis is very poor -palliative care evaluation appreciated: CODE STATUS changed to DNR by palliative care team  Concern for acute on chronic diastolic heart failure exacerbation -Cardiology has been consulted.  Volume managed by dialysis.    DVT prophylaxis: Heparin Code Status: DNR  family Communication: None at bedside Disposition Plan: Status is: Inpatient  Remains inpatient appropriate because:Inpatient level of care appropriate due to severity of illness   Dispo: The patient is from: Home              Anticipated d/c is to: Home              Anticipated d/c date is: 1 day              Patient currently is not medically  stable to d/c.   Consultants: Cardiology/nephrology/palliative care  Procedures: None  Antimicrobials: Rocephin/doxycycline Tenofovir being continued   Subjective: Patient seen and examined at bedside.  Awake, slightly confused, answers only some questions.  Very poor historian.  No overnight fever, vomiting reported by nursing  staff. Objective: Vitals:   02/11/20 0039 02/11/20 0137 02/11/20 0237 02/11/20 0340  BP:  (!) 101/54 (!) 113/45 98/68  Pulse: 100     Resp: 19   20  Temp: 99 F (37.2 C)   98.9 F (37.2 C)  TempSrc: Oral   Oral  SpO2: 94%   92%  Weight:      Height:        Intake/Output Summary (Last 24 hours) at 02/11/2020 0728 Last data filed at 02/10/2020 2329 Gross per 24 hour  Intake 708.65 ml  Output 2000 ml  Net -1291.35 ml   Filed Weights   02/10/20 1600 02/10/20 1916 02/10/20 1937  Weight: 70 kg 68 kg 67.2 kg    Examination:  General exam: Chronically ill looking.  No acute distress. Respiratory system: Bilateral decreased breath sounds at bases with some crackles.  No wheezing Cardiovascular system: Rate controlled, S1-S2 heard Gastrointestinal system: Abdomen is slightly distended, soft and nontender.  Bowel sounds are heard  extremities: Trace lower extremity edema present bilaterally.  No clubbing Central nervous system:   Awake, slightly confused, answers only some questions. No focal neurological deficits. Moving extremities Skin: No obvious ecchymosis/lesions Psychiatry: Cannot assess because of mental status  Data Reviewed: I have personally reviewed following labs and imaging studies  CBC: Recent Labs  Lab 02/05/20 0255 02/05/20 0255 02/08/20 0254 02/08/20 0254 02/08/20 2127 02/08/20 2147 02/09/20 1344 02/10/20 0307 02/11/20 0239  WBC 14.3*   < > 16.9*  --  15.0*  --  19.8* 14.6* 11.8*  NEUTROABS 11.6*  --   --   --  12.2*  --  15.7*  --  8.9*  HGB 10.7*   < > 11.1*   < > 11.3* 12.9* 10.4* 9.8* 9.2*  HCT 30.9*   < > 31.5*   < > 32.8* 38.0* 30.6* 27.8* 26.5*  MCV 78.8*   < > 80.4  --  80.0  --  81.6 80.1 79.3*  PLT 178   < > 186  --  173  --  144* 147* 130*   < > = values in this interval not displayed.   Basic Metabolic Panel: Recent Labs  Lab 02/05/20 0255 02/05/20 0255 02/08/20 0254 02/08/20 0254 02/08/20 2127 02/08/20 2147 02/09/20 1344  02/09/20 1905 02/10/20 0307 02/11/20 0239  NA 134*   < > 132*   < > 131* 133* 133*  --  132* 135  K 5.5*   < > 5.8*   < > 5.3* 5.2* 5.5*  --  6.3* 4.8  CL 93*   < > 91*   < > 89* 90* 91*  --  90* 96*  CO2 28   < > 26  --  31  --  25  --  26 27  GLUCOSE 102*   < > 129*   < > 105* 105* 84  --  77 88  BUN 50*   < > 71*   < > 38* 46* 56*  --  69* 40*  CREATININE 4.27*   < > 4.41*   < > 3.03* 3.00* 4.26*  --  5.09* 4.15*  CALCIUM 9.6   < > 9.6  --  9.1  --  9.2  --  9.1 8.7*  MG  --   --   --   --   --   --   --  2.3  --   --   PHOS 4.6  --   --   --   --   --   --   --   --   --    < > = values in this interval not displayed.   GFR: Estimated Creatinine Clearance: 18.7 mL/min (A) (by C-G formula based on SCr of 4.15 mg/dL (H)). Liver Function Tests: Recent Labs  Lab 02/05/20 0255 02/08/20 0254 02/08/20 2127 02/09/20 1344  AST  --  76* 65* 56*  ALT  --  137* 122* 97*  ALKPHOS  --  243* 250* 219*  BILITOT  --  1.3* 1.4* 1.8*  PROT  --  6.3* 6.6 5.8*  ALBUMIN 2.7* 2.9* 2.9* 2.6*   No results for input(s): LIPASE, AMYLASE in the last 168 hours. Recent Labs  Lab 02/11/20 0239  AMMONIA 36*   Coagulation Profile: Recent Labs  Lab 02/08/20 2127  INR 1.1   Cardiac Enzymes: No results for input(s): CKTOTAL, CKMB, CKMBINDEX, TROPONINI in the last 168 hours. BNP (last 3 results) No results for input(s): PROBNP in the last 8760 hours. HbA1C: No results for input(s): HGBA1C in the last 72 hours. CBG: Recent Labs  Lab 02/05/20 0721  GLUCAP 89   Lipid Profile: No results for input(s): CHOL, HDL, LDLCALC, TRIG, CHOLHDL, LDLDIRECT in the last 72 hours. Thyroid Function Tests: No results for input(s): TSH, T4TOTAL, FREET4, T3FREE, THYROIDAB in the last 72 hours. Anemia Panel: No results for input(s): VITAMINB12, FOLATE, FERRITIN, TIBC, IRON, RETICCTPCT in the last 72 hours. Sepsis Labs: Recent Labs  Lab 02/11/20 0239  PROCALCITON 3.73    Recent Results (from the past 240  hour(s))  SARS Coronavirus 2 by RT PCR (hospital order, performed in [facility] hospital lab) Nasopharyngeal Nasopharyngeal Swab     Status: None   Collection Time: 02/03/20  9:25 PM   Specimen: Nasopharyngeal Swab  Result Value Ref Range Status   SARS Coronavirus 2 NEGATIVE NEGATIVE Final    Comment: (NOTE) SARS-CoV-2 target nucleic acids are NOT DETECTED.  The SARS-CoV-2 RNA is generally detectable in upper and lower respiratory specimens during the acute phase of infection. The lowest concentration of SARS-CoV-2 viral copies this assay can detect is 250 copies / mL. A negative result does not preclude SARS-CoV-2 infection and should not be used as the sole basis for treatment or other patient management decisions.  A negative result may occur with improper specimen collection / handling, submission of specimen other than nasopharyngeal swab, presence of viral mutation(s) within the areas targeted by this assay, and inadequate number of viral copies (<250 copies / mL). A negative result must be combined with clinical observations, patient history, and epidemiological information.  Fact Sheet for Patients:   StrictlyIdeas.no  Fact Sheet for Healthcare Providers: BankingDealers.co.za  This test is not yet approved or  cleared by the [place] FDA and has been authorized for detection and/or diagnosis of SARS-CoV-2 by FDA under an Emergency Use Authorization (EUA).  This EUA will remain in effect (meaning this test can be used) for the duration of the COVID-19 declaration under Section 564(b)(1) of the Act, 21 U.S.C. section 360bbb-3(b)(1), unless the authorization is terminated or revoked sooner.  Performed at [facility], [place] [address] [place], [place] [address]   Culture, blood (routine x 2)  Status: None   Collection Time: 02/04/20  9:10 AM   Specimen: BLOOD LEFT WRIST  Result Value Ref Range Status   Specimen  Description BLOOD LEFT WRIST  Final   Special Requests   Final    BOTTLES DRAWN AEROBIC AND ANAEROBIC Blood Culture adequate volume   Culture   Final    NO GROWTH 5 DAYS Performed at [facility], [address], [place], [place] [address]    Report Status 02/09/2020 FINAL  Final  Culture, blood (routine x 2)     Status: None   Collection Time: 02/04/20  9:30 AM   Specimen: BLOOD RIGHT HAND  Result Value Ref Range Status   Specimen Description BLOOD RIGHT HAND  Final   Special Requests   Final    BOTTLES DRAWN AEROBIC AND ANAEROBIC Blood Culture results may not be optimal due to an inadequate volume of blood received in culture bottles   Culture   Final    NO GROWTH 5 DAYS Performed at [facility], [place] [address] [place], [place] [address]    Report Status 02/09/2020 FINAL  Final  SARS Coronavirus 2 by RT PCR (hospital order, performed in [facility] hospital lab) Nasopharyngeal Nasopharyngeal Swab     Status: None   Collection Time: 02/09/20  3:20 PM   Specimen: Nasopharyngeal Swab  Result Value Ref Range Status   SARS Coronavirus 2 NEGATIVE NEGATIVE Final    Comment: (NOTE) SARS-CoV-2 target nucleic acids are NOT DETECTED.  The SARS-CoV-2 RNA is generally detectable in upper and lower respiratory specimens during the acute phase of infection. The lowest concentration of SARS-CoV-2 viral copies this assay can detect is 250 copies / mL. A negative result does not preclude SARS-CoV-2 infection and should not be used as the sole basis for treatment or other patient management decisions.  A negative result may occur with improper specimen collection / handling, submission of specimen other than nasopharyngeal swab, presence of viral mutation(s) within the areas targeted by this assay, and inadequate number of viral copies (<250 copies / mL). A negative result must be combined with clinical observations, patient history, and epidemiological information.  Fact Sheet  for Patients:   StrictlyIdeas.no  Fact Sheet for Healthcare Providers: BankingDealers.co.za  This test is not yet approved or  cleared by the [place] FDA and has been authorized for detection and/or diagnosis of SARS-CoV-2 by FDA under an Emergency Use Authorization (EUA).  This EUA will remain in effect (meaning this test can be used) for the duration of the COVID-19 declaration under Section 564(b)(1) of the Act, 21 U.S.C. section 360bbb-3(b)(1), unless the authorization is terminated or revoked sooner.  Performed at [facility], [place] [address] [place],  [address]          Radiology Studies: DG Chest 2 View  Result Date: 02/09/2020 CLINICAL DATA:  59 year old with palpitations, generalized weakness and shortness of breath. The patient was seen in the emergency department yesterday for atrial fibrillation with rapid ventricular response, and recurrence of the atrial fibrillation was noted upon EMS arrival. Patient had successful cardioversion with Cardizem after unsuccessful attempts with adenosine. EXAM: CHEST - 2 VIEW COMPARISON:  02/08/2020 and earlier. FINDINGS: Cardiac silhouette markedly enlarged, unchanged. Thoracic aorta and proximal great vessels severely atherosclerotic. Prominent central pulmonary arteries, unchanged. Lungs clear. Mild pulmonary venous hypertension without overt edema. No visible pleural effusions. IMPRESSION: Stable marked cardiomegaly. No acute cardiopulmonary disease. Electronically Signed   By: Sherran Needs.D.  On: 02/09/2020 14:40   US Abdomen Limited  Result Date: 02/10/2020 CLINICAL DATA:  History of cirrhosis. Please perform ascites search ultrasound and ultrasound-guided paracentesis for therapeutic purposes as indicated. EXAM: LIMITED ABDOMEN ULTRASOUND FOR ASCITES TECHNIQUE: Limited ultrasound survey for ascites was performed in all four abdominal quadrants. COMPARISON:   Multiple previous ultrasound-guided paracenteses, most recently on 02/07/2020 yielding 780 cc of peritoneal fluid. FINDINGS: Sonographic evaluation of the abdomen demonstrates a trace amount of intra-abdominal ascites, too small to allow for safe ultrasound-guided paracentesis. No paracentesis attempted. IMPRESSION: Trace amount of intra-abdominal ascites, too small to allow for safe ultrasound-guided paracentesis. Electronically Signed   By: Sandi Mariscal M.D.   On: 02/10/2020 10:15        Scheduled Meds: . (feeding supplement) PROSource Plus  30 mL Oral BID BM  . calcitRIOL  0.5 mcg Oral Q M,W,F-HD  . Chlorhexidine Gluconate Cloth  6 each Topical Q0600  . digoxin  0.0625 mg Oral Daily  . diltiazem  120 mg Oral Daily  . doxycycline  100 mg Oral Q12H  . gabapentin  100 mg Oral TID  . heparin  5,000 Units Subcutaneous Q8H  . multivitamin  1 tablet Oral QHS  . sevelamer carbonate  3,200 mg Oral TID WC  . tenofovir  300 mg Oral Q Mon   Continuous Infusions: . sodium chloride    . sodium chloride    . cefTRIAXone (ROCEPHIN)  IV Stopped (02/10/20 2200)          Aline August, MD [facility] 02/11/2020, 7:28 AM

## 2020-02-11 NOTE — Progress Notes (Addendum)
CRITICAL VALUE ALERT  Critical Value:  Troponin 143  Date & Time Notied: 02/11/2020 0330  Provider Notified: Ninetta Lights, MD   Orders Received/Actions taken: No orders/actions at this time. Will await further advisement from provider.   Addendum @ 2446: Received order for repeat Troponin check.

## 2020-02-11 NOTE — Progress Notes (Addendum)
Floor coverage  Overnight event: Nursing staff concerned about possible ST elevation on telemetry.  Patient is asymptomatic.  Denies chest pain.  Currently rate controlled for his A. fib.  High sensitive troponin elevated at 143.  EKG was done and I have reviewed it with Dr. Vickki Muff from cardiology who feels ST changes are due to repolarization abnormality given LVH and LBBB.  He does have a history of CAD with a stent but given patient is asymptomatic and has no complaints of chest pain, ACS is less likely.  Troponin elevation likely due to end-stage renal disease.  -Continue cardiac monitoring -Trend troponin

## 2020-02-11 NOTE — Progress Notes (Addendum)
[facility] Progress Note   Subjective:   Patient seen and examined at bedside.  Somnolent this morning.  Oriented to place, person and year.  Denies CP, SOB, palpitations, weakness, dizziness, fever, chills and n/v/d.  HD yesterday tolerated well.   Objective Vitals:   02/11/20 0137 02/11/20 0237 02/11/20 0340 02/11/20 0808  BP: (!) 101/54 (!) 113/45 98/68 (!) 113/56  Pulse:    89  Resp:   20 18  Temp:   98.9 F (37.2 C) 100 F (37.8 C)  TempSrc:   Oral Axillary  SpO2:   92% 91%  Weight:      Height:       Physical Exam General:chronically ill appearing male in NAD, +somnolance  Heart:regular rate, irregular rhythm Lungs:+crackles Abdomen:soft, NT, +distention Extremities:no LE edema Dialysis Access: RU AVG +b   Filed Weights   02/10/20 1600 02/10/20 1916 02/10/20 1937  Weight: 70 kg 68 kg 67.2 kg    Intake/Output Summary (Last 24 hours) at 02/11/2020 0846 Last data filed at 02/10/2020 2329 Gross per 24 hour  Intake 708.65 ml  Output 2000 ml  Net -1291.35 ml    Additional Objective Labs: Basic Metabolic Panel: Recent Labs  Lab 02/05/20 0255 02/08/20 0254 02/09/20 1344 02/10/20 0307 02/11/20 0239  NA 134*   < > 133* 132* 135  K 5.5*   < > 5.5* 6.3* 4.8  CL 93*   < > 91* 90* 96*  CO2 28   < > 25 26 27   GLUCOSE 102*   < > 84 77 88  BUN 50*   < > 56* 69* 40*  CREATININE 4.27*   < > 4.26* 5.09* 4.15*  CALCIUM 9.6   < > 9.2 9.1 8.7*  PHOS 4.6  --   --   --   --    < > = values in this interval not displayed.   Liver Function Tests: Recent Labs  Lab 02/08/20 0254 02/08/20 2127 02/09/20 1344  AST 76* 65* 56*  ALT 137* 122* 97*  ALKPHOS 243* 250* 219*  BILITOT 1.3* 1.4* 1.8*  PROT 6.3* 6.6 5.8*  ALBUMIN 2.9* 2.9* 2.6*   CBC: Recent Labs  Lab 02/08/20 0254 02/08/20 0254 02/08/20 2127 02/08/20 2147 02/09/20 1344 02/10/20 0307 02/11/20 0239  WBC 16.9*   < > 15.0*   < > 19.8* 14.6* 11.8*  NEUTROABS  --   --  12.2*  --  15.7*  --   8.9*  HGB 11.1*   < > 11.3*   < > 10.4* 9.8* 9.2*  HCT 31.5*   < > 32.8*   < > 30.6* 27.8* 26.5*  MCV 80.4  --  80.0  --  81.6 80.1 79.3*  PLT 186   < > 173   < > 144* 147* 130*   < > = values in this interval not displayed.   CBG: Recent Labs  Lab 02/05/20 0721  GLUCAP 89   Studies/Results: DG Chest 2 View  Result Date: 02/09/2020 CLINICAL DATA:  59 year old with palpitations, generalized weakness and shortness of breath. The patient was seen in the emergency department yesterday for atrial fibrillation with rapid ventricular response, and recurrence of the atrial fibrillation was noted upon EMS arrival. Patient had successful cardioversion with Cardizem after unsuccessful attempts with adenosine. EXAM: CHEST - 2 VIEW COMPARISON:  02/08/2020 and earlier. FINDINGS: Cardiac silhouette markedly enlarged, unchanged. Thoracic aorta and proximal great vessels severely atherosclerotic. Prominent central pulmonary arteries, unchanged. Lungs clear. Mild pulmonary venous hypertension  without overt edema. No visible pleural effusions. IMPRESSION: Stable marked cardiomegaly. No acute cardiopulmonary disease. Electronically Signed   By: Evangeline Dakin M.D.   On: 02/09/2020 14:40   US Abdomen Limited  Result Date: 02/10/2020 CLINICAL DATA:  History of cirrhosis. Please perform ascites search ultrasound and ultrasound-guided paracentesis for therapeutic purposes as indicated. EXAM: LIMITED ABDOMEN ULTRASOUND FOR ASCITES TECHNIQUE: Limited ultrasound survey for ascites was performed in all four abdominal quadrants. COMPARISON:  Multiple previous ultrasound-guided paracenteses, most recently on 02/07/2020 yielding 780 cc of peritoneal fluid. FINDINGS: Sonographic evaluation of the abdomen demonstrates a trace amount of intra-abdominal ascites, too small to allow for safe ultrasound-guided paracentesis. No paracentesis attempted. IMPRESSION: Trace amount of intra-abdominal ascites, too small to allow for safe  ultrasound-guided paracentesis. Electronically Signed   By: Sandi Mariscal M.D.   On: 02/10/2020 10:15    Medications: . sodium chloride    . sodium chloride    . cefTRIAXone (ROCEPHIN)  IV Stopped (02/10/20 2200)   . (feeding supplement) PROSource Plus  30 mL Oral BID BM  . calcitRIOL  0.5 mcg Oral Q M,W,F-HD  . Chlorhexidine Gluconate Cloth  6 each Topical Q0600  . digoxin  0.0625 mg Oral Daily  . diltiazem  120 mg Oral Daily  . doxycycline  100 mg Oral Q12H  . gabapentin  100 mg Oral TID  . heparin  5,000 Units Subcutaneous Q8H  . mometasone-formoterol  2 puff Inhalation BID  . multivitamin  1 tablet Oral QHS  . sevelamer carbonate  3,200 mg Oral TID WC  . tenofovir  300 mg Oral Q Mon    Dialysis Orders: MonWedFri, 4 hrs 0 min, Optiflux 180NR, BFR 450, DFR Manual 800 mL/min, EDW 61 (kg), Dialysate 2.0 K, 2.0 Ca, 1.0 Mg, 100 Dextrose (G2201), Sodium 137 (mEq/L), Bicarb Setting: 35 (mEq/L), UFR Profile: None, Sodium Model: None, Access: AVGraft-Synthetic - Gore Acuseal..Marland KitchenNO HEPARIN no ESA or Fe calcitriol 0.5 Recent hgb 12  Ca low 10s P ^ ipTH 254   Assessment/Plan: 1. SOB/acute hypoxic resp failure - ?CAP. Breathing improved per pt. Remains on 4L O2 via [place].  CXR on admission with no evidence cardiopulmomary disease. UF 2L removed yesterday. ABX started.  2. A fib - rate controlled this AM.  PO digoxin started by Dr. Einar Gip. Per cards/primary 3. Hyperkalemia - resolved with HD. K 4.8.  Tends to run high. URRs checked 3 x in July but ran low and he had abbreviated treatments all 3 times.  Part of [place] is flat mid graft with out much of a thrill there -may need a shuntagram. Plan eval AVGG during treatments at Rx BFR and trend K/Cr in controlled environment.   4. ESRD - On HD MWF.  Orders written for HD today per regular schedule.  No heparin. 5. Anemia of CKD- Hgb 9.2, trending down. Aranesp 55mcg qwk starting today.    6. Secondary hyperparathyroidism - CCa has been elevated.  VDRA  recently lowered as OP, continue for now. Use 2Ca bath. Will check phos.  Continue binders.  7. HTN/volume - BP soft, limits UF goal.  Does not appear grossly volume overloaded.   8. Nutrition - Renal diet with fluid restrictions. Low albumin. +Supplements.  9. Chronic Hep B/Hep C/Cirrhosis/recurrent ascites - weekly paracentesis. Per primary 10. COPD 56. Chronic pain syndrome 12. CODE status - changed to DNR after palliative consult, appreciate their assistance.      [name], PA-C [facility] 02/11/2020,8:46 AM  LOS: 2 days  I have seen and examined this patient and agree with plan and assessment in the above note with renal recommendations/intervention highlighted.  Seen on HD and tolerating well. Remains in A fib with RVR.  UF as tolerated.  Broadus John A Alicyn Klann,MD 02/11/2020 2:28 PM

## 2020-02-11 NOTE — Progress Notes (Signed)
PT Cancellation Note  Patient Details Name: [name] MRN: [id number] DOB: 1960-09-25   Cancelled Treatment:    Reason Eval/Treat Not Completed: Other (comment).  Pt was unwilling to do therapy, then had his meal, then had appt for HD.  Follow as time and pt allow.   Ramond Dial 02/11/2020, 1:46 PM   Mee Hives, PT MS Acute Rehab Dept. Number: [place] [facility] [place]

## 2020-02-11 NOTE — Progress Notes (Signed)
Started with Digoxin load and qod dose of dig 0.0625 mg. Will observe and see how he responds. Running out of options.    Adrian Prows, MD, [facility] 02/11/2020, 1:26 PM Office: [phone]

## 2020-02-11 NOTE — Progress Notes (Signed)
Tele monitor tech notified RN that patient had ST elevation on monitor. EKG completed. Pt alert and denies chest pain at this time. VS documented. No obvious status changes from this RN initial assessment. Sent message to provider to notify. Received callback and new orders from provider.

## 2020-02-12 DIAGNOSIS — I4819 Other persistent atrial fibrillation: Secondary | ICD-10-CM

## 2020-02-12 DIAGNOSIS — I483 Typical atrial flutter: Secondary | ICD-10-CM

## 2020-02-12 LAB — COMPREHENSIVE METABOLIC PANEL
ALT: 103 U/L — ABNORMAL HIGH (ref 0–44)
AST: 74 U/L — ABNORMAL HIGH (ref 15–41)
Albumin: 2.5 g/dL — ABNORMAL LOW (ref 3.5–5.0)
Alkaline Phosphatase: 222 U/L — ABNORMAL HIGH (ref 38–126)
Anion gap: 11 (ref 5–15)
BUN: 18 mg/dL (ref 6–20)
CO2: 29 mmol/L (ref 22–32)
Calcium: 8.9 mg/dL (ref 8.9–10.3)
Chloride: 94 mmol/L — ABNORMAL LOW (ref 98–111)
Creatinine, Ser: 3.08 mg/dL — ABNORMAL HIGH (ref 0.61–1.24)
GFR calc Af Amer: 25 mL/min — ABNORMAL LOW (ref >60)
GFR calc non Af Amer: 21 mL/min — ABNORMAL LOW (ref >60)
Glucose, Bld: 123 mg/dL — ABNORMAL HIGH (ref 70–99)
Potassium: 3.6 mmol/L (ref 3.5–5.1)
Sodium: 134 mmol/L — ABNORMAL LOW (ref 135–145)
Total Bilirubin: 1.8 mg/dL — ABNORMAL HIGH (ref 0.3–1.2)
Total Protein: 6.1 g/dL — ABNORMAL LOW (ref 6.5–8.1)

## 2020-02-12 LAB — CBC WITH DIFFERENTIAL/PLATELET
Abs Immature Granulocytes: 0.05 10*3/uL (ref 0.00–0.07)
Basophils Absolute: 0 10*3/uL (ref 0.0–0.1)
Basophils Relative: 0 %
Eosinophils Absolute: 0.2 10*3/uL (ref 0.0–0.5)
Eosinophils Relative: 2 %
HCT: 27.9 % — ABNORMAL LOW (ref 39.0–52.0)
Hemoglobin: 9.7 g/dL — ABNORMAL LOW (ref 13.0–17.0)
Immature Granulocytes: 1 %
Lymphocytes Relative: 10 %
Lymphs Abs: 1 10*3/uL (ref 0.7–4.0)
MCH: 28.3 pg (ref 26.0–34.0)
MCHC: 34.8 g/dL (ref 30.0–36.0)
MCV: 81.3 fL (ref 80.0–100.0)
Monocytes Absolute: 1.4 10*3/uL — ABNORMAL HIGH (ref 0.1–1.0)
Monocytes Relative: 14 %
Neutro Abs: 7.4 10*3/uL (ref 1.7–7.7)
Neutrophils Relative %: 73 %
Platelets: 136 10*3/uL — ABNORMAL LOW (ref 150–400)
RBC: 3.43 MIL/uL — ABNORMAL LOW (ref 4.22–5.81)
RDW: 16.4 % — ABNORMAL HIGH (ref 11.5–15.5)
WBC: 10.1 10*3/uL (ref 4.0–10.5)
nRBC: 0 % (ref 0.0–0.2)

## 2020-02-12 LAB — AMMONIA: Ammonia: 35 umol/L (ref 9–35)

## 2020-02-12 MED ORDER — CHLORHEXIDINE GLUCONATE CLOTH 2 % EX PADS
6.0000 | MEDICATED_PAD | Freq: Every day | CUTANEOUS | Status: DC
  Administered 2020-02-12 – 2020-02-13 (×2): 6 via TOPICAL

## 2020-02-12 MED ORDER — TEMAZEPAM 15 MG PO CAPS
15.0000 mg | ORAL_CAPSULE | Freq: Every evening | ORAL | Status: DC | PRN
  Administered 2020-02-12: 15 mg via ORAL
  Filled 2020-02-12: qty 1

## 2020-02-12 NOTE — Consult Note (Addendum)
Cardiology Consultation:   Patient ID: [name] MRN: [id number]; DOB: [birth year]  Admit date: 02/09/2020 Date of Consult: 02/12/2020  Primary Care Provider: [name], FNP [facility] Cardiologist: Dr. Moshe Cipro [facility] Electrophysiologist:  Has seen Dr. Curt Bears in consult   Patient Profile:   [name] is a 59 y.o. male with a hx of hypertension, CAD (mid RCA PCI 02/2017), persistent Afib/flutter, severe COPD, Cor Pulmonale, p.HTN, ESRD on hemodialysis,chronic hepatitis B and Cwith liver cirrhosis, prior h/o GI bleed, h/o polysubstance abuse (quit cocaine 2017), Afib (not anticoagulated 2/2 end stage liver disease, kidney disease, GIB hx and noncompliance), hypertensive heart disease who is being seen today for the evaluation of AFib management options at the request of Dr. Einar Gip.  History of Present Illness:   [name] in review of his chart with a number of hospital stays 2/2 volume OL with missed dialysis sessions and Afib w/RVR with medication non-compliance as well.  He has been in/out of the ER of late leaving without discharge or AMA  Most recently signed out [place] on 7/27 when he was being treated for A. fib with RVR and recently seen in the ED yesterday after being brought in by ambulance for shortness of breath and dizziness with wide-complex tachycardia and given amiodarone at the time who went home and presented again 7/31 to the ED for shortness of breath and palpitations upon awakening.  Upon EMS arrival he was noted to have a heart rate in the 160s and received 6 mg then 12 mg of adenosine with no change and his rate and rhythm improved with 20 mg of Cardizem  Dr. Einar Gip who seems to know him well, notes are reviewed  He was referred to EP in Feb for evaluation of his AF management options, at that time not a rhythm control candidate given inbility to anticoagulate, and poor candidate for ablation given his significant co morbid  conditions, even if a/c could be considered and he could display compliance with his medicines and care.  Current rate controlling meds Digoxin 0.0625mg  M-W-F (started here) Diltiazem gtt >> 120mg  daily  On Sunday  (240mg  daily at home) Rates in the last 24 hours have been much improved generally 90's   Past Medical History:  Diagnosis Date  . Anemia   . Anxiety   . Arthritis    left shoulder  . Atherosclerosis of aorta ([place])   . Cardiomegaly   . Chest pain    DATE UNKNOWN, C/O PERIODICALLY  . Cocaine abuse ([place])   . COPD exacerbation ([place]) 08/17/2016  . Coronary artery disease    stent 02/22/17  . ESRD (end stage renal disease) on dialysis ([place])    "[name]; MWF" (07/04/2017)  . GERD (gastroesophageal reflux disease)    DATE UNKNOWN  . Hemorrhoids   . Hepatitis B, chronic ([place])   . Hepatitis C   . History of kidney stones   . Hyperkalemia   . Hypertension   . Kidney failure   . Metabolic bone disease    Patient denies  . Mitral stenosis   . Myocardial infarction ([place])   . Pneumonia   . Pulmonary edema   . Solitary rectal ulcer syndrome 07/2017   at flex sig for rectal bleeding  . Tubular adenoma of colon     Past Surgical History:  Procedure Laterality Date  . A/V FISTULAGRAM Left 05/26/2017   Procedure: A/V FISTULAGRAM;  Surgeon: Conrad Aguilita, MD;  Location: [place] [facility];  Service: Cardiovascular;  Laterality: Left;  . A/V FISTULAGRAM Right 11/18/2017   Procedure: A/V FISTULAGRAM - Right Arm;  Surgeon: Elam Dutch, MD;  Location: [place] [facility];  Service: Cardiovascular;  Laterality: Right;  . APPLICATION OF WOUND VAC Left 06/14/2017   Procedure: APPLICATION OF WOUND VAC;  Surgeon: Katha Cabal, MD;  Location: [facility];  Service: Vascular;  Laterality: Left;  . AV FISTULA PLACEMENT  2012   BELIEVED WAS PLACED IN JUNE  . AV FISTULA PLACEMENT Right 08/09/2017   Procedure: Creation Right arm ARTERIOVENOUS BRACHIOCEPOHALIC FISTULA;  Surgeon:  Elam Dutch, MD;  Location: [facility] OR;  Service: Vascular;  Laterality: Right;  . AV FISTULA PLACEMENT Right 11/22/2017   Procedure: INSERTION OF ARTERIOVENOUS (AV) GORE-TEX GRAFT RIGHT UPPER ARM;  Surgeon: Elam Dutch, MD;  Location: [place];  Service: Vascular;  Laterality: Right;  . BIOPSY  01/25/2018   Procedure: BIOPSY;  Surgeon: Jerene Bears, MD;  Location: [place];  Service: Gastroenterology;;  . BIOPSY  04/10/2019   Procedure: BIOPSY;  Surgeon: Jerene Bears, MD;  Location: [facility];  Service: Gastroenterology;;  . COLONOSCOPY    . COLONOSCOPY WITH PROPOFOL N/A 01/25/2018   Procedure: COLONOSCOPY WITH PROPOFOL;  Surgeon: Jerene Bears, MD;  Location: [place];  Service: Gastroenterology;  Laterality: N/A;  . CORONARY STENT INTERVENTION N/A 02/22/2017   Procedure: CORONARY STENT INTERVENTION;  Surgeon: Nigel Mormon, MD;  Location: [place] [facility];  Service: Cardiovascular;  Laterality: N/A;  . ESOPHAGOGASTRODUODENOSCOPY (EGD) WITH PROPOFOL N/A 01/25/2018   Procedure: ESOPHAGOGASTRODUODENOSCOPY (EGD) WITH PROPOFOL;  Surgeon: Jerene Bears, MD;  Location: [place];  Service: Gastroenterology;  Laterality: N/A;  . ESOPHAGOGASTRODUODENOSCOPY (EGD) WITH PROPOFOL N/A 04/10/2019   Procedure: ESOPHAGOGASTRODUODENOSCOPY (EGD) WITH PROPOFOL;  Surgeon: Jerene Bears, MD;  [facility]: [facility];  Service: Gastroenterology;  Laterality: N/A;  . FLEXIBLE SIGMOIDOSCOPY N/A 07/15/2017   Procedure: FLEXIBLE SIGMOIDOSCOPY;  Surgeon: Carol Ada, MD;  Location: [place];  Service: Endoscopy;  Laterality: N/A;  . HEMORRHOID BANDING    . I & D EXTREMITY Left 06/01/2017   Procedure: IRRIGATION AND DEBRIDEMENT LEFT ARM HEMATOMA WITH LIGATION OF LEFT ARM AV FISTULA;  Surgeon: Elam Dutch, MD;  Location: [place];  Service: Vascular;  Laterality: Left;  . I & D EXTREMITY Left 06/14/2017   Procedure: IRRIGATION AND DEBRIDEMENT EXTREMITY;  Surgeon: Katha Cabal, MD;   Location: [facility];  Service: Vascular;  Laterality: Left;  . INSERTION OF DIALYSIS CATHETER  05/30/2017  . INSERTION OF DIALYSIS CATHETER N/A 05/30/2017   Procedure: INSERTION OF DIALYSIS CATHETER;  Surgeon: Elam Dutch, MD;  Location: [place];  Service: Vascular;  Laterality: N/A;  . IR PARACENTESIS  08/30/2017  . IR PARACENTESIS  09/29/2017  . IR PARACENTESIS  10/28/2017  . IR PARACENTESIS  11/09/2017  . IR PARACENTESIS  11/16/2017  . IR PARACENTESIS  11/28/2017  . IR PARACENTESIS  12/01/2017  . IR PARACENTESIS  12/06/2017  . IR PARACENTESIS  01/03/2018  . IR PARACENTESIS  01/23/2018  . IR PARACENTESIS  02/07/2018  . IR PARACENTESIS  02/21/2018  . IR PARACENTESIS  03/06/2018  . IR PARACENTESIS  03/17/2018  . IR PARACENTESIS  04/04/2018  . IR PARACENTESIS  12/28/2018  . IR PARACENTESIS  01/08/2019  . IR PARACENTESIS  01/23/2019  . IR PARACENTESIS  02/01/2019  . IR PARACENTESIS  02/19/2019  . IR PARACENTESIS  03/01/2019  . IR PARACENTESIS  03/15/2019  .  IR PARACENTESIS  04/03/2019  . IR PARACENTESIS  04/12/2019  . IR PARACENTESIS  05/01/2019  . IR PARACENTESIS  05/08/2019  . IR PARACENTESIS  05/24/2019  . IR PARACENTESIS  06/12/2019  . IR PARACENTESIS  07/09/2019  . IR PARACENTESIS  07/27/2019  . IR PARACENTESIS  08/09/2019  . IR PARACENTESIS  08/21/2019  . IR PARACENTESIS  09/17/2019  . IR PARACENTESIS  10/05/2019  . IR PARACENTESIS  10/29/2019  . IR PARACENTESIS  11/08/2019  . IR PARACENTESIS  12/12/2019  . IR PARACENTESIS  01/03/2020  . IR PARACENTESIS  01/10/2020  . IR PARACENTESIS  01/17/2020  . IR PARACENTESIS  01/24/2020  . IR PARACENTESIS  01/31/2020  . IR PARACENTESIS  02/07/2020  . IR RADIOLOGIST EVAL & MGMT  02/14/2018  . IR RADIOLOGIST EVAL & MGMT  02/22/2019  . LEFT HEART CATH AND CORONARY ANGIOGRAPHY N/A 02/22/2017   Procedure: LEFT HEART CATH AND CORONARY ANGIOGRAPHY;  Surgeon: Nigel Mormon, MD;  Location: [place] [facility];  Service: Cardiovascular;  Laterality: N/A;  . LIGATION OF  ARTERIOVENOUS  FISTULA Left 03/16/1739   Procedure: Plication of Left Arm Arteriovenous Fistula;  Surgeon: Elam Dutch, MD;  Location: [place];  Service: Vascular;  Laterality: Left;  . POLYPECTOMY    . POLYPECTOMY  01/25/2018   Procedure: POLYPECTOMY;  Surgeon: Jerene Bears, MD;  Location: [place];  Service: Gastroenterology;;  . REVISON OF ARTERIOVENOUS FISTULA Left 02/22/4817   Procedure: PLICATION OF DISTAL ANEURYSMAL SEGEMENT OF LEFT UPPER ARM ARTERIOVENOUS FISTULA;  Surgeon: Elam Dutch, MD;  Location: [place];  Service: Vascular;  Laterality: Left;  . REVISON OF ARTERIOVENOUS FISTULA Left 5/63/1497   Procedure: Plication of Left Upper Arm Fistula ;  Surgeon: Waynetta Sandy, MD;  Location: [place];  Service: Vascular;  Laterality: Left;  . SKIN GRAFT SPLIT THICKNESS LEG / FOOT Left    SKIN GRAFT SPLIT THICKNESS LEFT ARM DONOR SITE: LEFT ANTERIOR THIGH  . SKIN SPLIT GRAFT Left 07/04/2017   Procedure: SKIN GRAFT SPLIT THICKNESS LEFT ARM DONOR SITE: LEFT ANTERIOR THIGH;  Surgeon: Elam Dutch, MD;  Location: [place];  Service: Vascular;  Laterality: Left;  . THROMBECTOMY W/ EMBOLECTOMY Left 06/05/2017   Procedure: EXPLORATION OF LEFT ARM FOR BLEEDING; OVERSEWED PROXIMAL FISTULA;  Surgeon: Angelia Mould, MD;  Location: [place];  Service: Vascular;  Laterality: Left;  . WOUND EXPLORATION Left 06/03/2017   Procedure: WOUND EXPLORATION WITH WOUND VAC APPLICATION TO LEFT ARM;  Surgeon: Angelia Mould, MD;  Location: [place];  Service: Vascular;  Laterality: Left;     Home Medications:  Prior to Admission medications   Medication Sig Start Date End Date Taking? Authorizing Provider  albuterol (VENTOLIN HFA) 108 (90 Base) MCG/ACT inhaler Inhale 2 puffs into the lungs every 4 (four) hours as needed for wheezing or shortness of breath.  02/03/20  Yes [provider]  amiodarone (PACERONE) 200 MG tablet Take 1 tablet (200 mg total) by mouth daily. 01/29/20  Yes  Shelly Coss, MD  diltiazem (CARDIZEM CD) 240 MG 24 hr capsule Take 240 mg by mouth daily. 01/29/20  Yes [provider]  diphenhydrAMINE (BENADRYL) 25 mg capsule Take 1 capsule (25 mg total) by mouth every 6 (six) hours as needed for itching or allergies. 01/29/20  Yes Shelly Coss, MD  gabapentin (NEURONTIN) 300 MG capsule Take 1 capsule (300 mg total) by mouth 2 (two) times daily. 01/23/20  Yes Adrian Prows, MD  metoprolol tartrate (LOPRESSOR)  25 MG tablet Take 0.5 tablets (12.5 mg total) by mouth 2 (two) times daily. Patient taking differently: Take 25 mg by mouth 2 (two) times daily.  01/29/20  Yes Shelly Coss, MD  Oxycodone HCl 10 MG TABS Take 10 mg by mouth 3 (three) times daily as needed (pain).  01/24/20  Yes [provider]  sevelamer carbonate (RENVELA) 800 MG tablet Take 2,400 mg by mouth See admin instructions. Take 3 tablets (2400 mg) by mouth up to three times daily with meals   Yes [provider]  tenofovir (VIREAD) 300 MG tablet Take 300 mg by mouth every Monday.  01/03/20  Yes [provider]    Inpatient Medications: Scheduled Meds: . (feeding supplement) PROSource Plus  30 mL Oral BID BM  . calcitRIOL  0.5 mcg Oral Q M,W,F-HD  . Chlorhexidine Gluconate Cloth  6 each Topical Q0600  . darbepoetin (ARANESP) injection - DIALYSIS  60 mcg Intravenous Q Mon-HD  . [START ON 02/13/2020] digoxin  0.0625 mg Oral Q M,W,F  . diltiazem  120 mg Oral Daily  . doxycycline  100 mg Oral Q12H  . gabapentin  100 mg Oral TID  . heparin  5,000 Units Subcutaneous Q8H  . lactulose  10 g Oral BID  . mometasone-formoterol  2 puff Inhalation BID  . multivitamin  1 tablet Oral QHS  . sevelamer carbonate  3,200 mg Oral TID WC  . tenofovir  300 mg Oral Q Mon   Continuous Infusions: . sodium chloride    . sodium chloride    . cefTRIAXone (ROCEPHIN)  IV Stopped (02/11/20 1823)   PRN Meds: sodium chloride, sodium chloride, ipratropium-albuterol,  oxyCODONE  Allergies:    Allergies  Allergen Reactions  . Tramadol Itching and Other (See Comments)  . Morphine And Related Other (See Comments)    Stomach pain  . Pollen Extract Other (See Comments)    Other reaction(s): Sneezing (finding)  . Acetaminophen Nausea Only    Stomach ache   . Aspirin Itching and Other (See Comments)    STOMACH PAIN   . Clonidine Derivatives Itching    Social History:   Social History   Socioeconomic History  . Marital status: Single    Spouse name: Not on file  . Number of children: 3  . Years of education: 10  . Highest education level: Not on file  Occupational History  . Occupation: Unemployed  Tobacco Use  . Smoking status: Current Every Day Smoker    Packs/day: 0.50    Years: 43.00    Pack years: 21.50    Types: Cigarettes    Start date: 08/13/1973  . Smokeless tobacco: Never Used  Vaping Use  . Vaping Use: Never used  Substance and Sexual Activity  . Alcohol use: Not Currently    Comment: quit drinking in 2017  . Drug use: Not Currently    Types: Marijuana, Cocaine    Comment: reports using once every 3 months,  Quit 04-06-2019  . Sexual activity: Not Currently    Birth control/protection: None  Other Topics Concern  . Not on file  Social History Narrative   Lives alone   Caffeine use: Coffee-rare   Soda- daily      [facility] (03/10/17):   Originally from [facility] Previously worked trimming trees. No pets currently. No bird or mold exposure.    Social Determinants of Health   Financial Resource Strain:   . Difficulty of Paying Living Expenses:   Food Insecurity:   .  Worried About Charity fundraiser in the Last Year:   . Arboriculturist in the Last Year:   Transportation Needs:   . Film/video editor (Medical):   Marland Kitchen Lack of Transportation (Non-Medical):   Physical Activity:   . Days of Exercise per Week:   . Minutes of Exercise per Session:   Stress:   . Feeling of Stress :   Social Connections:   .  Frequency of Communication with Friends and Family:   . Frequency of Social Gatherings with Friends and Family:   . Attends Religious Services:   . Active Member of Clubs or Organizations:   . Attends Archivist Meetings:   Marland Kitchen Marital Status:   Intimate Partner Violence:   . Fear of Current or Ex-Partner:   . Emotionally Abused:   Marland Kitchen Physically Abused:   . Sexually Abused:     Family History:   Family History  Problem Relation Age of Onset  . Heart disease Mother   . Lung cancer Mother   . Heart disease Father   . Malignant hyperthermia Father   . COPD Father   . Throat cancer Sister   . Esophageal cancer Sister   . Hypertension Other   . COPD Other   . Colon cancer Neg Hx   . Colon polyps Neg Hx   . Rectal cancer Neg Hx   . Stomach cancer Neg Hx      ROS:  Please see the history of present illness.  All other ROS reviewed and negative.     Physical Exam/Data:   Vitals:   02/11/20 2255 02/11/20 2359 02/12/20 0328 02/12/20 0849  BP:  111/64 108/71 124/68  Pulse:  72 80   Resp:  16 19   Temp: 100.3 F (37.9 C) 99.9 F (37.7 C) 99 F (37.2 C) 98.8 F (37.1 C)  TempSrc: Oral Oral Oral Oral  SpO2:  96% 96%   Weight:      Height:        Intake/Output Summary (Last 24 hours) at 02/12/2020 1001 Last data filed at 02/12/2020 0900 Gross per 24 hour  Intake 322 ml  Output 2500 ml  Net -2178 ml   Last 3 Weights 02/11/2020 02/10/2020 02/10/2020  Weight (lbs) 148 lb 5.9 oz 148 lb 2.4 oz 149 lb 14.6 oz  Weight (kg) 67.3 kg 67.2 kg 68 kg  Some encounter information is confidential and restricted. Go to Review Flowsheets activity to see all data.      The patient was examined by Dr. Rayann Heman Body mass index is 21.91 kg/m.  General:  Well nourished, well developed, in no acute distress, chronically i8ll appearing HEENT: normal Lymph: no adenopathy Neck: no JVD Endocrine:  No thryomegaly Vascular: No carotid bruits Cardiac:  irreg-irreg; 1/6 SM, no gallops or  rubs Lungs:  CTA b/l,  no wheezing, rhonchi or rales  Abd: soft, nontender  Ext: no edema Musculoskeletal:  No deformities Skin: warm and dry  Neuro:  CNs 2-12 intact, no focal abnormalities noted Psych:  Normal affect   EKG:  The EKG was personally reviewed and demonstrates:    02/09/20 Regular rhythm, LBBB, no clear P wave, 91bpm, suspetc AFlutter 02/11/20 irregular, LBBB 103bpm, likely AFib  02/08/20 SVT 183bpm 02/03/20 AFib 116bpm, LBBB, LAD  Telemetry:  Telemetry was personally reviewed and demonstrates:   AFib 80's-110's, generally rate controlled 90's last 24 hours  Relevant CV Studies:  02/05/2020: TTE IMPRESSIONS  1. Left ventricular ejection fraction, by  estimation, is 50 to 55%. The  left ventricle has normal function. There is severe left ventricular  hypertrophy. Left ventricular diastolic function could not be evaluated. I  cannot exclude septal hypokinesis.  Endocardium not well visualized. septal motion also is consistent with  LBBB.  2. Right ventricular systolic function is mildly reduced. The right  ventricular size is moderately enlarged. Severely increased right  ventricular wall thickness. There is moderately elevated pulmonary artery  systolic pressure. The estimated right  ventricular systolic pressure is 53.6 mmHg.  3. Left atrial size was severely dilated.  4. Right atrial size was severely dilated.  5. The mitral valve is normal in structure. Mild mitral valve  regurgitation.  6. Dilated TV annulus. Tricuspid valve regurgitation is moderate to  severe.  7. AV sclerosis without stenosis. The aortic valve is tricuspid. Aortic  valve regurgitation is trivial. Mild aortic valve sclerosis is present,  with no evidence of aortic valve stenosis. Aortic valve mean gradient  measures 8.3 mmHg.  8. The inferior vena cava is dilated in size with <50% respiratory  variability, suggesting right atrial pressure of 15 mmHg.    Echocardiogram  10/04/2019: 1. Low normal LV systolic function; severe LVH (myocardium with specked appearance; consider amyloid); mild to moderate AS (mean gradient 11 mmHg  and AVA 1.3 cm2); mild AI; thickened MV with mild to moderate MS (mean gradient 6 mmHg and MVA 1.2-2 cm2;  pt in atrial fibrillation at time of study); severe biatrial enlargement; mild RVE; severe RV dysfunction; severe TR with severe pulmonary hypertension.  2. Left ventricular ejection fraction, by estimation, is 50 to 55%. The left ventricle has low normal function. The left ventricle has no regional  wall motion abnormalities. There is severe left ventricular hypertrophy.  Left ventricular diastolic function could not be evaluated.  3. Right ventricular systolic function is severely reduced. The right ventricular size is mildly enlarged. There is severely elevated pulmonary artery systolic pressure.  4. Left atrial size was severely dilated.  5. Right atrial size was severely dilated.  6. Large pleural effusion in the left lateral region.  7. The mitral valve is normal in structure. Mild mitral valve regurgitation. Mild to moderate mitral stenosis.  8. Tricuspid valve regurgitation is severe.  9. The aortic valve is normal in structure. Aortic valve regurgitation is mild. Mild to moderate aortic valve stenosis.  10. The inferior vena cava is dilated in size with <50% respiratory variability, suggesting right atrial pressure of 15 mmHg.  Laboratory Data:  High Sensitivity Troponin:   Recent Labs  Lab 01/20/20 1543 02/03/20 2013 02/04/20 0313 02/11/20 0239 02/11/20 1123  TROPONINIHS 49* 84* 93* 143* 136*     Chemistry Recent Labs  Lab 02/10/20 0307 02/11/20 0239 02/12/20 0431  NA 132* 135 134*  K 6.3* 4.8 3.6  CL 90* 96* 94*  CO2 26 27 29   GLUCOSE 77 88 123*  BUN 69* 40* 18  CREATININE 5.09* 4.15* 3.08*  CALCIUM 9.1 8.7* 8.9  GFRNONAA 12* 15* 21*  GFRAA 13* 17* 25*  ANIONGAP 16* 12 11    Recent Labs  Lab  02/08/20 2127 02/09/20 1344 02/12/20 0431  PROT 6.6 5.8* 6.1*  ALBUMIN 2.9* 2.6* 2.5*  AST 65* 56* 74*  ALT 122* 97* 103*  ALKPHOS 250* 219* 222*  BILITOT 1.4* 1.8* 1.8*   Hematology Recent Labs  Lab 02/10/20 0307 02/11/20 0239 02/12/20 0431  WBC 14.6* 11.8* 10.1  RBC 3.47* 3.34* 3.43*  HGB 9.8* 9.2* 9.7*  HCT  27.8* 26.5* 27.9*  MCV 80.1 79.3* 81.3  MCH 28.2 27.5 28.3  MCHC 35.3 34.7 34.8  RDW 16.9* 16.2* 16.4*  PLT 147* 130* 136*   BNPNo results for input(s): BNP, PROBNP in the last 168 hours.  DDimer No results for input(s): DDIMER in the last 168 hours.   Radiology/Studies:  DG Chest 2 View  Result Date: 02/09/2020 CLINICAL DATA:  59 year old with palpitations, generalized weakness and shortness of breath. The patient was seen in the emergency department yesterday for atrial fibrillation with rapid ventricular response, and recurrence of the atrial fibrillation was noted upon EMS arrival. Patient had successful cardioversion with Cardizem after unsuccessful attempts with adenosine. EXAM: CHEST - 2 VIEW COMPARISON:  02/08/2020 and earlier. FINDINGS: Cardiac silhouette markedly enlarged, unchanged. Thoracic aorta and proximal great vessels severely atherosclerotic. Prominent central pulmonary arteries, unchanged. Lungs clear. Mild pulmonary venous hypertension without overt edema. No visible pleural effusions. IMPRESSION: Stable marked cardiomegaly. No acute cardiopulmonary disease. Electronically Signed   By: Evangeline Dakin M.D.   On: 02/09/2020 14:40   US Abdomen Limited  Result Date: 02/10/2020 CLINICAL DATA:  History of cirrhosis. Please perform ascites search ultrasound and ultrasound-guided paracentesis for therapeutic purposes as indicated. EXAM: LIMITED ABDOMEN ULTRASOUND FOR ASCITES TECHNIQUE: Limited ultrasound survey for ascites was performed in all four abdominal quadrants. COMPARISON:  Multiple previous ultrasound-guided paracenteses, most recently on 02/07/2020  yielding 780 cc of peritoneal fluid. FINDINGS: Sonographic evaluation of the abdomen demonstrates a trace amount of intra-abdominal ascites, too small to allow for safe ultrasound-guided paracentesis. No paracentesis attempted. IMPRESSION: Trace amount of intra-abdominal ascites, too small to allow for safe ultrasound-guided paracentesis. Electronically Signed   By: Sandi Mariscal M.D.   On: 02/10/2020 10:15   DG Chest Port 1 View  Result Date: 02/08/2020 CLINICAL DATA:  V-tach EXAM: PORTABLE CHEST 1 VIEW COMPARISON:  February 03, 2020 FINDINGS: Again noted is mild cardiomegaly. Aortic knob calcifications. There is prominence of the central pulmonary vasculature. No large airspace consolidation or pleural effusion. There is stable elevation of the left hemidiaphragm. IMPRESSION: Mild cardiomegaly and pulmonary vascular congestion. Electronically Signed   By: Prudencio Pair M.D.   On: 02/08/2020 22:30     Assessment and Plan:   1. Persistent AFib     CHA2DS2Vasc is 2, not anticoagulated 2/2 end stage liver disease, kidney disease, GIB hx and noncompliance  Rate control has been established Rhythm control not an option for him with the inability to anticoagulate maintenance of rhythm highly unlikely given his p.HTN, CorPulmonale, severely enlarged atria and recurrent noncompliance I do not this he is an ablation candidate given all the above as well.  His current regime has gained rate control  Pace and ablate strategy undesirable as well with significantly increased infection rate, and should he run into RV pacing induced CM would certainly be very problematic for this patient  Rate control has been established in the last 24 hours, monitor dig closely given ERSF/HD.  He is not on BB here, was on metoprolol at home and high dilt dose as well, would continue to work with these to his BP tolerance Will defer titration of his meds to Dr. Einar Gip  Dr. Rayann Heman has seen and examined the patient this  morning His final thoughts and recommendations to follow    For questions or updates, please contact [place] Please consult [email or web address] for contact info under    Signed, Baldwin Jamaica, PA-C  02/12/2020 10:01 AM   I have seen, examined the  patient, and reviewed the above assessment and plan.  Changes to above are made where necessary.  On exam, chronically ill, iRRR.  The patient has multiple comorbidities as well as noncompliance which complicates care. I would not advise AV nodal ablation.   One option would be to consider tertiary referral (electively as an outpatient) to Dr Willis Modena at [facility] to consider convergent procedure.  This may be an effective way to manage his refractory atrial arrhythmias.  Electrophysiology team to see as needed while here. Please call with questions.   Co Sign: Thompson Grayer, MD 02/12/2020 6:16 PM

## 2020-02-12 NOTE — Progress Notes (Signed)
Patient ID: [name], male   DOB: 03/14/1961, 59 y.o.   MRN: [id number]  PROGRESS NOTE    [name]  XNT:[id number] DOB: 1960/07/20 DOA: 02/09/2020 PCP: [name], FNP   Brief Narrative:  59 year old male who is a poor historian with past medical history of ESRD on dialysis MWF, COPD, prior alcohol abuse, A. fib with poor compliance (patient of Dr. Einar Gip), cirrhosis with recurrent ascites and weekly paracentesis, chronic pain who was seen in the ED multiple times over the past week for diastolic heart failure exacerbation, A. fib, hyperkalemia most recently signed out [place] on 7/27 when he was being treated for A. fib with RVR and recently seen in the ED on 02/08/2020 after being brought in by ambulance for shortness of breath and dizziness with wide-complex tachycardia and given amiodarone at the time who went home and presented again on 02/09/2020 for shortness of breath and palpitations.  Upon EMS arrival, his heart rate was in the 160s and received 6 mg followed by 12 mg of adenosine with no change in his rate and subsequently required 20 mg of Cardizem.  In the ED, he was still tachycardic up to 140s.  Cardiology/Dr. Einar Gip was consulted.  Chest x-ray showed mild cardiomegaly with no acute cardiopulmonary disease.  He was started on Cardizem drip.  Nephrology was also consulted.  Assessment & Plan:   Paroxysmal A. fib with RVR -Patient received Eliquis and 6 mg and 12 mg by EMS without improvement and subsequently was started on Cardizem drip.  Heart rate improving.  Subsequently, he has been off of Cardizem drip.  Oral digoxin has been added by Dr. Einar Gip.  Await formal consultation and recommendations by Dr. Einar Gip.  Blood pressure on the lower side. -Patient has had multiple recent ER presentations and hospital admission and subsequent leaving AMA   End-stage renal disease on hemodialysis Hyperkalemia: Resolved -Nephrology following.  Dialysis as per nephrology  schedule.  Chronic hepatitis B and hepatitis C with decompensated liver cirrhosis and recurrent ascites with weekly paracentesis.  -Patient recently had ultrasound-guided paracentesis.  There is not enough fluid for safe paracentesis on 02/10/2020. -Continue tenofovir  Acute metabolic encephalopathy/acute hepatic encephalopathy -Mental status improving.  Probably from a combination hypoxia and hepatic encephalopathy.  Started on lactulose on 02/11/2020 but patient refuses it as per nursing staff.  Monitor mental status.  Fall precautions.  Concern for community-acquired pneumonia Acute hypoxic respiratory failure -Patient presented with leukocytosis, was tachycardic and required supplemental oxygen on presentation.  Still requiring 4 L oxygen.  Patient intermittently removing his oxygen.  Currently on empiric Rocephin and doxycycline; finish 5-day course of therapy.  WBCs normalized  Leukocytosis -Resolved  COPD -Continue Dulera and as needed nebs.  Recently had been treated with prednisone  Thrombocytopenia -Questionable cause.  No signs of bleeding  Anemia of chronic disease -From renal failure.  Hemoglobin stable  Chronic pain syndrome -Continue gabapentin along with as needed OxyContin  Tobacco abuse -Patient will need tobacco cessation  Generalized deconditioning -PT eval pending -Overall prognosis is very poor -palliative care evaluation appreciated: CODE STATUS changed to DNR by palliative care team  Concern for acute on chronic diastolic heart failure exacerbation -Cardiology has been consulted.  Volume managed by dialysis.    DVT prophylaxis: Heparin Code Status: DNR  family Communication: None at bedside Disposition Plan: Status is: Inpatient  Remains inpatient appropriate because:Inpatient level of care appropriate due to severity of illness.  Currently still requiring supplemental oxygen.  PT eval  pending.   Dispo: The patient is from: Home               Anticipated d/c is to: Home              Anticipated d/c date is: 1 day              Patient currently is not medically stable to d/c.   Consultants: Cardiology/nephrology/palliative care  Procedures: None  Antimicrobials:  Anti-infectives (From admission, onward)   Start     Dose/Rate Route Frequency Ordered Stop   02/11/20 1815  cefTRIAXone (ROCEPHIN) 2 g in sodium chloride 0.9 % 100 mL IVPB     Discontinue     2 g 200 mL/hr over 30 Minutes Intravenous Every 24 hours 02/11/20 1513 02/14/20 1814   02/11/20 1000  tenofovir (VIREAD) tablet 300 mg     Discontinue     300 mg Oral Every Mon 02/09/20 1922     02/09/20 2200  doxycycline (VIBRA-TABS) tablet 100 mg     Discontinue     100 mg Oral Every 12 hours 02/09/20 1804 02/14/20 0959   02/09/20 1815  cefTRIAXone (ROCEPHIN) 1 g in sodium chloride 0.9 % 100 mL IVPB  Status:  Discontinued        1 g 200 mL/hr over 30 Minutes Intravenous Every 24 hours 02/09/20 1804 02/11/20 1513       Subjective: Patient seen and examined at bedside.  More awake this morning and answers some questions although still a poor historian.  Nursing staff reported that he refused lactulose and intermittent does not wear his oxygen.  No overnight fevers, vomiting reported.  Had fever overnight.   Objective: Vitals:   02/11/20 2214 02/11/20 2255 02/11/20 2359 02/12/20 0328  BP: 105/60  111/64 108/71  Pulse: 97  72 80  Resp: 19  16 19   Temp: (!) 100.8 F (38.2 C) 100.3 F (37.9 C) 99.9 F (37.7 C) 99 F (37.2 C)  TempSrc: Oral Oral Oral Oral  SpO2: 94%  96% 96%  Weight:      Height:        Intake/Output Summary (Last 24 hours) at 02/12/2020 0739 Last data filed at 02/11/2020 1810 Gross per 24 hour  Intake 322 ml  Output 2500 ml  Net -2178 ml   Filed Weights   02/10/20 1916 02/10/20 1937 02/11/20 1354  Weight: 68 kg 67.2 kg 67.3 kg    Examination:  General exam: No distress.  Looks chronically ill and older than stated age.   Respiratory  system: Bilateral decreased breath sounds at bases with scattered crackles cardiovascular system: S1-S2 heard, rate controlled Gastrointestinal system: Abdomen is still slightly distended, soft and nontender.  Normal bowel sounds heard  extremities: No cyanosis.  Bilateral lower extremity mild edema present  Central nervous system:  More awake this morning and answers some questions although still a poor historian. No focal neurological deficits. Moving extremities Skin: No obvious petechiae/ulcers Psychiatry: Flat affect  Data Reviewed: I have personally reviewed following labs and imaging studies  CBC: Recent Labs  Lab 02/08/20 2127 02/08/20 2127 02/08/20 2147 02/09/20 1344 02/10/20 0307 02/11/20 0239 02/12/20 0431  WBC 15.0*  --   --  19.8* 14.6* 11.8* 10.1  NEUTROABS 12.2*  --   --  15.7*  --  8.9* 7.4  HGB 11.3*   < > 12.9* 10.4* 9.8* 9.2* 9.7*  HCT 32.8*   < > 38.0* 30.6* 27.8* 26.5* 27.9*  MCV 80.0  --   --  81.6 80.1 79.3* 81.3  PLT 173  --   --  144* 147* 130* 136*   < > = values in this interval not displayed.   Basic Metabolic Panel: Recent Labs  Lab 02/08/20 2127 02/08/20 2127 02/08/20 2147 02/09/20 1344 02/09/20 1905 02/10/20 0307 02/11/20 0239 02/11/20 1123 02/12/20 0431  NA 131*   < > 133* 133*  --  132* 135  --  134*  K 5.3*   < > 5.2* 5.5*  --  6.3* 4.8  --  3.6  CL 89*   < > 90* 91*  --  90* 96*  --  94*  CO2 31  --   --  25  --  26 27  --  29  GLUCOSE 105*   < > 105* 84  --  77 88  --  123*  BUN 38*   < > 46* 56*  --  69* 40*  --  18  CREATININE 3.03*   < > 3.00* 4.26*  --  5.09* 4.15*  --  3.08*  CALCIUM 9.1  --   --  9.2  --  9.1 8.7*  --  8.9  MG  --   --   --   --  2.3  --   --   --   --   PHOS  --   --   --   --   --   --   --  6.5*  --    < > = values in this interval not displayed.   GFR: Estimated Creatinine Clearance: 25.2 mL/min (A) (by C-G formula based on SCr of 3.08 mg/dL (H)). Liver Function Tests: Recent Labs  Lab 02/08/20 0254  02/08/20 2127 02/09/20 1344 02/12/20 0431  AST 76* 65* 56* 74*  ALT 137* 122* 97* 103*  ALKPHOS 243* 250* 219* 222*  BILITOT 1.3* 1.4* 1.8* 1.8*  PROT 6.3* 6.6 5.8* 6.1*  ALBUMIN 2.9* 2.9* 2.6* 2.5*   No results for input(s): LIPASE, AMYLASE in the last 168 hours. Recent Labs  Lab 02/11/20 0239 02/12/20 0431  AMMONIA 36* 35   Coagulation Profile: Recent Labs  Lab 02/08/20 2127  INR 1.1   Cardiac Enzymes: No results for input(s): CKTOTAL, CKMB, CKMBINDEX, TROPONINI in the last 168 hours. BNP (last 3 results) No results for input(s): PROBNP in the last 8760 hours. HbA1C: No results for input(s): HGBA1C in the last 72 hours. CBG: No results for input(s): GLUCAP in the last 168 hours. Lipid Profile: No results for input(s): CHOL, HDL, LDLCALC, TRIG, CHOLHDL, LDLDIRECT in the last 72 hours. Thyroid Function Tests: No results for input(s): TSH, T4TOTAL, FREET4, T3FREE, THYROIDAB in the last 72 hours. Anemia Panel: No results for input(s): VITAMINB12, FOLATE, FERRITIN, TIBC, IRON, RETICCTPCT in the last 72 hours. Sepsis Labs: Recent Labs  Lab 02/11/20 0239  PROCALCITON 3.73    Recent Results (from the past 240 hour(s))  SARS Coronavirus 2 by RT PCR (hospital order, performed in [facility] hospital lab) Nasopharyngeal Nasopharyngeal Swab     Status: None   Collection Time: 02/03/20  9:25 PM   Specimen: Nasopharyngeal Swab  Result Value Ref Range Status   SARS Coronavirus 2 NEGATIVE NEGATIVE Final    Comment: (NOTE) SARS-CoV-2 target nucleic acids are NOT DETECTED.  The SARS-CoV-2 RNA is generally detectable in upper and lower respiratory specimens during the acute phase of infection. The lowest concentration of SARS-CoV-2 viral copies this assay can detect is 250 copies / mL. A negative  result does not preclude SARS-CoV-2 infection and should not be used as the sole basis for treatment or other patient management decisions.  A negative result may occur with  improper specimen collection / handling, submission of specimen other than nasopharyngeal swab, presence of viral mutation(s) within the areas targeted by this assay, and inadequate number of viral copies (<250 copies / mL). A negative result must be combined with clinical observations, patient history, and epidemiological information.  Fact Sheet for Patients:   StrictlyIdeas.no  Fact Sheet for Healthcare Providers: BankingDealers.co.za  This test is not yet approved or  cleared by the [place] FDA and has been authorized for detection and/or diagnosis of SARS-CoV-2 by FDA under an Emergency Use Authorization (EUA).  This EUA will remain in effect (meaning this test can be used) for the duration of the COVID-19 declaration under Section 564(b)(1) of the Act, 21 U.S.C. section 360bbb-3(b)(1), unless the authorization is terminated or revoked sooner.  Performed at [facility], [place] [address] [place], [place] [address]   Culture, blood (routine x 2)     Status: None   Collection Time: 02/04/20  9:10 AM   Specimen: BLOOD LEFT WRIST  Result Value Ref Range Status   Specimen Description BLOOD LEFT WRIST  Final   Special Requests   Final    BOTTLES DRAWN AEROBIC AND ANAEROBIC Blood Culture adequate volume   Culture   Final    NO GROWTH 5 DAYS Performed at [facility], [place] [address] [place], [place] [address]    Report Status 02/09/2020 FINAL  Final  Culture, blood (routine x 2)     Status: None   Collection Time: 02/04/20  9:30 AM   Specimen: BLOOD RIGHT HAND  Result Value Ref Range Status   Specimen Description BLOOD RIGHT HAND  Final   Special Requests   Final    BOTTLES DRAWN AEROBIC AND ANAEROBIC Blood Culture results may not be optimal due to an inadequate volume of blood received in culture bottles   Culture   Final    NO GROWTH 5 DAYS Performed at [facility], [place] [address] [place], [place]  [address]    Report Status 02/09/2020 FINAL  Final  SARS Coronavirus 2 by RT PCR (hospital order, performed in [facility] hospital lab) Nasopharyngeal Nasopharyngeal Swab     Status: None   Collection Time: 02/09/20  3:20 PM   Specimen: Nasopharyngeal Swab  Result Value Ref Range Status   SARS Coronavirus 2 NEGATIVE NEGATIVE Final    Comment: (NOTE) SARS-CoV-2 target nucleic acids are NOT DETECTED.  The SARS-CoV-2 RNA is generally detectable in upper and lower respiratory specimens during the acute phase of infection. The lowest concentration of SARS-CoV-2 viral copies this assay can detect is 250 copies / mL. A negative result does not preclude SARS-CoV-2 infection and should not be used as the sole basis for treatment or other patient management decisions.  A negative result may occur with improper specimen collection / handling, submission of specimen other than nasopharyngeal swab, presence of viral mutation(s) within the areas targeted by this assay, and inadequate number of viral copies (<250 copies / mL). A negative result must be combined with clinical observations, patient history, and epidemiological information.  Fact Sheet for Patients:   StrictlyIdeas.no  Fact Sheet for Healthcare Providers: BankingDealers.co.za  This test is not yet approved or  cleared by the [place] FDA and has been authorized for detection and/or diagnosis of SARS-CoV-2 by  FDA under an Emergency Use Authorization (EUA).  This EUA will remain in effect (meaning this test can be used) for the duration of the COVID-19 declaration under Section 564(b)(1) of the Act, 21 U.S.C. section 360bbb-3(b)(1), unless the authorization is terminated or revoked sooner.  Performed at [facility], [place] [address], [place], [place] [address]          Radiology Studies: US Abdomen Limited  Result Date: 02/10/2020 CLINICAL DATA:  History of cirrhosis.  Please perform ascites search ultrasound and ultrasound-guided paracentesis for therapeutic purposes as indicated. EXAM: LIMITED ABDOMEN ULTRASOUND FOR ASCITES TECHNIQUE: Limited ultrasound survey for ascites was performed in all four abdominal quadrants. COMPARISON:  Multiple previous ultrasound-guided paracenteses, most recently on 02/07/2020 yielding 780 cc of peritoneal fluid. FINDINGS: Sonographic evaluation of the abdomen demonstrates a trace amount of intra-abdominal ascites, too small to allow for safe ultrasound-guided paracentesis. No paracentesis attempted. IMPRESSION: Trace amount of intra-abdominal ascites, too small to allow for safe ultrasound-guided paracentesis. Electronically Signed   By: Sandi Mariscal M.D.   On: 02/10/2020 10:15        Scheduled Meds: . (feeding supplement) PROSource Plus  30 mL Oral BID BM  . calcitRIOL  0.5 mcg Oral Q M,W,F-HD  . Chlorhexidine Gluconate Cloth  6 each Topical Q0600  . darbepoetin (ARANESP) injection - DIALYSIS  60 mcg Intravenous Q Mon-HD  . [START ON 02/13/2020] digoxin  0.0625 mg Oral Q M,W,F  . diltiazem  120 mg Oral Daily  . doxycycline  100 mg Oral Q12H  . gabapentin  100 mg Oral TID  . heparin  5,000 Units Subcutaneous Q8H  . lactulose  10 g Oral BID  . mometasone-formoterol  2 puff Inhalation BID  . multivitamin  1 tablet Oral QHS  . sevelamer carbonate  3,200 mg Oral TID WC  . tenofovir  300 mg Oral Q Mon   Continuous Infusions: . sodium chloride    . sodium chloride    . cefTRIAXone (ROCEPHIN)  IV Stopped (02/11/20 1823)          Aline August, MD [facility] 02/12/2020, 7:39 AM

## 2020-02-12 NOTE — Progress Notes (Addendum)
[facility] Progress Note   Subjective:   Patient seen and examined at bedside.  Eating ice cream.  Back to baseline mental status.  No specific complaints.  Denies SOB, CP, palpitations, n/v/d, weakness and fatigue.   Objective Vitals:   02/11/20 2255 02/11/20 2359 02/12/20 0328 02/12/20 0849  BP:  111/64 108/71 124/68  Pulse:  72 80   Resp:  16 19   Temp: 100.3 F (37.9 C) 99.9 F (37.7 C) 99 F (37.2 C) 98.8 F (37.1 C)  TempSrc: Oral Oral Oral Oral  SpO2:  96% 96%   Weight:      Height:       Physical Exam General:chronically ill appearing male in NAD laying in bed Heart:IRIR Lungs:mostly CTAB, +scattered rhonchi Abdomen:soft, NT, +distention Extremities:no LE edema Dialysis Access: RU AVG +b   Filed Weights   02/10/20 1916 02/10/20 1937 02/11/20 1354  Weight: 68 kg 67.2 kg 67.3 kg    Intake/Output Summary (Last 24 hours) at 02/12/2020 1020 Last data filed at 02/12/2020 0900 Gross per 24 hour  Intake 322 ml  Output 2500 ml  Net -2178 ml    Additional Objective Labs: Basic Metabolic Panel: Recent Labs  Lab 02/10/20 0307 02/11/20 0239 02/11/20 1123 02/12/20 0431  NA 132* 135  --  134*  K 6.3* 4.8  --  3.6  CL 90* 96*  --  94*  CO2 26 27  --  29  GLUCOSE 77 88  --  123*  BUN 69* 40*  --  18  CREATININE 5.09* 4.15*  --  3.08*  CALCIUM 9.1 8.7*  --  8.9  PHOS  --   --  6.5*  --    Liver Function Tests: Recent Labs  Lab 02/08/20 2127 02/09/20 1344 02/12/20 0431  AST 65* 56* 74*  ALT 122* 97* 103*  ALKPHOS 250* 219* 222*  BILITOT 1.4* 1.8* 1.8*  PROT 6.6 5.8* 6.1*  ALBUMIN 2.9* 2.6* 2.5*   CBC: Recent Labs  Lab 02/08/20 2127 02/08/20 2147 02/09/20 1344 02/09/20 1344 02/10/20 0307 02/11/20 0239 02/12/20 0431  WBC 15.0*   < > 19.8*   < > 14.6* 11.8* 10.1  NEUTROABS 12.2*   < > 15.7*  --   --  8.9* 7.4  HGB 11.3*   < > 10.4*   < > 9.8* 9.2* 9.7*  HCT 32.8*   < > 30.6*   < > 27.8* 26.5* 27.9*  MCV 80.0  --  81.6  --  80.1  79.3* 81.3  PLT 173   < > 144*   < > 147* 130* 136*   < > = values in this interval not displayed.   Blood Culture    Component Value Date/Time   SDES BLOOD RIGHT HAND 02/04/2020 0930   SPECREQUEST  02/04/2020 0930    BOTTLES DRAWN AEROBIC AND ANAEROBIC Blood Culture results may not be optimal due to an inadequate volume of blood received in culture bottles   CULT  02/04/2020 0930    NO GROWTH 5 DAYS Performed at [facility], [place] [address], [place], [place] [address]    REPTSTATUS 02/09/2020 FINAL 02/04/2020 0930     Medications: . sodium chloride    . sodium chloride    . cefTRIAXone (ROCEPHIN)  IV Stopped (02/11/20 1823)   . (feeding supplement) PROSource Plus  30 mL Oral BID BM  . calcitRIOL  0.5 mcg Oral Q M,W,F-HD  . Chlorhexidine Gluconate Cloth  6 each Topical Q0600  .  darbepoetin (ARANESP) injection - DIALYSIS  60 mcg Intravenous Q Mon-HD  . [START ON 02/13/2020] digoxin  0.0625 mg Oral Q M,W,F  . diltiazem  120 mg Oral Daily  . doxycycline  100 mg Oral Q12H  . gabapentin  100 mg Oral TID  . heparin  5,000 Units Subcutaneous Q8H  . lactulose  10 g Oral BID  . mometasone-formoterol  2 puff Inhalation BID  . multivitamin  1 tablet Oral QHS  . sevelamer carbonate  3,200 mg Oral TID WC  . tenofovir  300 mg Oral Q Mon    Dialysis Orders: MonWedFri, 4 hrs 0 min, Optiflux 180NR, BFR 450, DFR Manual 800 mL/min, EDW 61 (kg), Dialysate 2.0 K, 2.0 Ca, 1.0 Mg, 100 Dextrose (G2201), Sodium 137 (mEq/L), Bicarb Setting: 35 (mEq/L), UFR Profile: None, Sodium Model: None, Access: AVGraft-Synthetic - Gore Acuseal..Marland KitchenNO HEPARINno ESA or Fe calcitriol 0.5 Recent hgb 12 Ca low 10s P ^ ipTH 254   Assessment/Plan: 1. SOB/acute hypoxic resp failure - ?CAP. Breathing improved per pt.  Now on RA. CXR on admission with no evidence cardiopulmomary disease. UF 2L removed yesterday. ABX started.  2. A fib -  PO digoxin started by Dr. Einar Gip. Per cards/primary 3. Hyperkalemia - resolved  with HD. K 3.6.  Usually runs high. URRs checked 3 x in July but ran low and he had abbreviated treatments all 3 times. Part of [place] is flat mid graft with out much of a thrill there -may need a shuntagram. Plan eval AVGG during treatments at Rx BFR and trend K/Cr in controlled environment.  K/Cr appropriately lowered with dialysis - fistulogram not needed at this time.     4. ESRD - On HD MWF.  HD yesterday tolerated well.  Next HD 8/4.  No heparin. 5. Anemia of CKD- Hgb 9.7. Aranesp 68mcg qwk (Mon)    6. Secondary hyperparathyroidism - CCa has been elevated.  VDRA recently lowered as OP, continue for now. Use 2Ca bath. Phos 6.5, continue binders.  7. HTN/volume - BP soft, limits UF goal.  Does not appear grossly volume overloaded.  UF as tolerated. 8. Nutrition - Renal diet with fluid restrictions. Low albumin. +Supplements.  9. Chronic Hep B/Hep C/Cirrhosis/recurrent ascites - weekly paracentesis. Per primary 10. COPD 54. Chronic pain syndrome 12. CODE status - changed to DNR after palliative consult, appreciate their assistance.   [name], PA-C [facility] 02/12/2020,10:20 AM  LOS: 3 days   I have seen and examined this patient and agree with plan and assessment in the above note with renal recommendations/intervention highlighted.  Not a candidate for ablation so started on digoxin per Cardiology.  Currently rate controlled.  Broadus John A Regie Bunner,MD 02/12/2020 11:44 AM

## 2020-02-12 NOTE — Progress Notes (Signed)
Physical Therapy Evaluation Patient Details Name: [name] MRN: [id number] DOB: [birth year] Today's Date: 02/12/2020   History of Present Illness  59 y.o. male was admitted for recurrent issues of CHF, with A-fib w RVR, and now acute encephalopathy.  Has been a struggle to medically manage with his last dc from hosp being AMA, but returning with SOB, dizziness, tachycardia.   PMHx:  ESRD, CAD, PAF, cirrhosis, Hep B, Hep C, EtOH abuse, COPD, tobacco use, weekly paracentesis.  Clinical Impression  Pt was seen for mobility evaluation, and is reporting pain issues that are slowing his pace.  Follow acutely but should not need further therapy at DC.  Pt is up in room with no assist, will walk longer distances with pt and manage LLE strength with there ex.  Follow up should be maintenance of activity once medically managed.    Follow Up Recommendations No PT follow up    Equipment Recommendations  None recommended by PT    Recommendations for Other Services       Precautions / Restrictions Precautions Precautions: Fall Precaution Comments: monitor HR during gait bouts Restrictions Weight Bearing Restrictions: No      Mobility  Bed Mobility Overal bed mobility: Modified Independent Bed Mobility: Supine to Sit;Sit to Supine;Rolling Rolling: Modified independent (Device/Increase time)   Supine to sit: Supervision Sit to supine: Supervision   General bed mobility comments: cannot move R hip or leg due to post op pain  Transfers Overall transfer level: Modified independent Equipment used: None                Ambulation/Gait Ambulation/Gait assistance: Supervision;Min guard Gait Distance (Feet): 200 Feet Assistive device: 1 person hand held assist (min guard for safety) Gait Pattern/deviations: Step-through pattern;Wide base of support;Decreased stride length Gait velocity: functional Gait velocity interpretation: <1.31 ft/sec, indicative of household  ambulator General Gait Details: standing rest due to pain on LLE, stops to ask MD directly to give him more pain meds  Stairs            Wheelchair Mobility    Modified Rankin (Stroke Patients Only)       Balance Overall balance assessment: Needs assistance Sitting-balance support: Feet supported Sitting balance-Leahy Scale: Good     Standing balance support: Single extremity supported;During functional activity Standing balance-Leahy Scale: Fair                               Pertinent Vitals/Pain Pain Assessment: Faces Faces Pain Scale: Hurts even more Pain Location: L thigh and abdomen Pain Descriptors / Indicators: Grimacing;Guarding;Burning Pain Intervention(s): Monitored during session;Limited activity within patient's tolerance;Premedicated before session;Repositioned    Home Living Family/patient expects to be discharged to:: Unsure Living Arrangements: Alone Available Help at Discharge: Personal care attendant Type of Home: Apartment Home Access: Stairs to enter Entrance Stairs-Rails: None Entrance Stairs-Number of Steps: 2 Home Layout: One level Home Equipment: Walker - 2 wheels;Cane - single point Additional Comments: pt is not forthcoming with much information, this history is from chart    Prior Function Level of Independence: Independent         Comments: no AD needed prior to this admission     Hand Dominance   Dominant Hand: Right    Extremity/Trunk Assessment   Upper Extremity Assessment Upper Extremity Assessment: Overall WFL for tasks assessed    Lower Extremity Assessment Lower Extremity Assessment: LLE deficits/detail LLE Deficits / Details: pain with mild weakness  LLE Coordination: decreased gross motor    Cervical / Trunk Assessment Cervical / Trunk Assessment: Normal  Communication   Communication: No difficulties  Cognition Arousal/Alertness: Awake/alert Behavior During Therapy: WFL for tasks  assessed/performed Overall Cognitive Status: Within Functional Limits for tasks assessed                                        General Comments General comments (skin integrity, edema, etc.): pt is able to walk but is noting discomfort.  Had stopped with PT near MD, asking to walk behind the counter at nurses' station.  MD walked over as pt yelled to her, and agreed to look at pt's chart    Exercises     Assessment/Plan    PT Assessment Patient needs continued PT services  PT Problem List Decreased strength;Decreased range of motion;Decreased activity tolerance;Decreased mobility;Pain       PT Treatment Interventions DME instruction;Gait training;Functional mobility training;Therapeutic activities;Therapeutic exercise;Balance training;Neuromuscular re-education;Patient/family education    PT Goals (Current goals can be found in the Care Plan section)  Acute Rehab PT Goals Patient Stated Goal: to get pain meds increased PT Goal Formulation: With patient Time For Goal Achievement: 02/19/20 Potential to Achieve Goals: Good    Frequency Min 3X/week   Barriers to discharge   able to walk without AD, can go home with no assist to walk    Co-evaluation               AM-PAC PT "6 Clicks" Mobility  Outcome Measure Help needed turning from your back to your side while in a flat bed without using bedrails?: None Help needed moving from lying on your back to sitting on the side of a flat bed without using bedrails?: None Help needed moving to and from a bed to a chair (including a wheelchair)?: None Help needed standing up from a chair using your arms (e.g., wheelchair or bedside chair)?: A Little Help needed to walk in hospital room?: A Little Help needed climbing 3-5 steps with a railing? : A Little 6 Click Score: 21    End of Session Equipment Utilized During Treatment: Gait belt Activity Tolerance: Patient tolerated treatment well Patient left: in  bed;with call bell/phone within reach Nurse Communication: Mobility status PT Visit Diagnosis: Pain Pain - Right/Left: Left Pain - part of body: Hip;Knee    Time: 6629-4765 PT Time Calculation (min) (ACUTE ONLY): 31 min   Charges:   PT Evaluation $PT Eval Moderate Complexity: 1 Mod PT Treatments $Gait Training: 8-22 mins       Ramond Dial 02/12/2020, 1:18 PM  Mee Hives, PT MS Acute Rehab Dept. Number: [place] and [place]

## 2020-02-13 LAB — COMPREHENSIVE METABOLIC PANEL
ALT: 82 U/L — ABNORMAL HIGH (ref 0–44)
AST: 47 U/L — ABNORMAL HIGH (ref 15–41)
Albumin: 2.5 g/dL — ABNORMAL LOW (ref 3.5–5.0)
Alkaline Phosphatase: 239 U/L — ABNORMAL HIGH (ref 38–126)
Anion gap: 16 — ABNORMAL HIGH (ref 5–15)
BUN: 26 mg/dL — ABNORMAL HIGH (ref 6–20)
CO2: 28 mmol/L (ref 22–32)
Calcium: 9.5 mg/dL (ref 8.9–10.3)
Chloride: 90 mmol/L — ABNORMAL LOW (ref 98–111)
Creatinine, Ser: 5.02 mg/dL — ABNORMAL HIGH (ref 0.61–1.24)
GFR calc Af Amer: 14 mL/min — ABNORMAL LOW (ref >60)
GFR calc non Af Amer: 12 mL/min — ABNORMAL LOW (ref >60)
Glucose, Bld: 134 mg/dL — ABNORMAL HIGH (ref 70–99)
Potassium: 4.2 mmol/L (ref 3.5–5.1)
Sodium: 134 mmol/L — ABNORMAL LOW (ref 135–145)
Total Bilirubin: 1.3 mg/dL — ABNORMAL HIGH (ref 0.3–1.2)
Total Protein: 6.3 g/dL — ABNORMAL LOW (ref 6.5–8.1)

## 2020-02-13 LAB — CBC WITH DIFFERENTIAL/PLATELET
Abs Immature Granulocytes: 0.08 10*3/uL — ABNORMAL HIGH (ref 0.00–0.07)
Basophils Absolute: 0 10*3/uL (ref 0.0–0.1)
Basophils Relative: 0 %
Eosinophils Absolute: 0.3 10*3/uL (ref 0.0–0.5)
Eosinophils Relative: 2 %
HCT: 29 % — ABNORMAL LOW (ref 39.0–52.0)
Hemoglobin: 10 g/dL — ABNORMAL LOW (ref 13.0–17.0)
Immature Granulocytes: 1 %
Lymphocytes Relative: 12 %
Lymphs Abs: 1.6 10*3/uL (ref 0.7–4.0)
MCH: 27.9 pg (ref 26.0–34.0)
MCHC: 34.5 g/dL (ref 30.0–36.0)
MCV: 80.8 fL (ref 80.0–100.0)
Monocytes Absolute: 1.2 10*3/uL — ABNORMAL HIGH (ref 0.1–1.0)
Monocytes Relative: 9 %
Neutro Abs: 9.9 10*3/uL — ABNORMAL HIGH (ref 1.7–7.7)
Neutrophils Relative %: 76 %
Platelets: 135 10*3/uL — ABNORMAL LOW (ref 150–400)
RBC: 3.59 MIL/uL — ABNORMAL LOW (ref 4.22–5.81)
RDW: 16.2 % — ABNORMAL HIGH (ref 11.5–15.5)
WBC: 13 10*3/uL — ABNORMAL HIGH (ref 4.0–10.5)
nRBC: 0 % (ref 0.0–0.2)

## 2020-02-13 LAB — MAGNESIUM: Magnesium: 2.2 mg/dL (ref 1.7–2.4)

## 2020-02-13 MED ORDER — GABAPENTIN 100 MG PO CAPS: 100.0000 mg | ORAL_CAPSULE | Freq: Three times a day (TID) | ORAL | 0 refills | Status: DC

## 2020-02-13 MED ORDER — CEFDINIR 300 MG PO CAPS
300.0000 mg | ORAL_CAPSULE | ORAL | 0 refills | Status: AC
Start: 2020-02-13 — End: 2020-02-21

## 2020-02-13 MED ORDER — DIGOXIN 62.5 MCG PO TABS: 0.0625 mg | ORAL_TABLET | ORAL | 0 refills | Status: DC

## 2020-02-13 MED ORDER — DILTIAZEM HCL ER COATED BEADS 120 MG PO CP24: 120.0000 mg | ORAL_CAPSULE | Freq: Every day | ORAL | 0 refills | Status: DC

## 2020-02-13 NOTE — TOC Transition Note (Signed)
Transition of Care [facility]) - CM/SW Discharge Note   Patient Details  Name: [name] MRN: [id number] Date of Birth: [birth year]  [facility] of [facility] CM/SW Contact:  Joanne Chars, LCSW Phone Number: 02/13/2020, 10:37 AM   Clinical Narrative:   CSW met with pt to discuss discharge.  CSW confirmed with [facility] with [facility] that they had received referral and she confirmed this.  CSW informed pt that [facility] will reach out to schedule first appt.  CSW asked about primary care appt and pt stated that he cannot return to his listed PCP, Dustin Folks.  This was confirmed by calling the office.  Pt agreeable to initiating care with new PCP and appt was made with [facility], first available was 03/05/20 at 150PM.  Pt was attempting to contact his ride at this time and pt ride did come prior to PCP appt being scheduled.  Pt notified by phone of this appt and agreed to attend. [phone].    Final next level of care: Home/Self Care Barriers to Discharge: Barriers Resolved   Patient Goals and CMS Choice        Discharge Placement                       Discharge Plan and Services                                     Social Determinants of Health (SDOH) Interventions     Readmission Risk Interventions Readmission Risk Prevention Plan 10/01/2019  Transportation Screening Complete  Medication Review ([place]) Complete  PCP or Specialist appointment within 3-5 days of discharge (No Data)  [place] or [place] Patient Refused  Some recent data might be hidden

## 2020-02-13 NOTE — Discharge Instructions (Signed)

## 2020-02-13 NOTE — Progress Notes (Addendum)
[facility] Progress Note   Subjective:   Patient seen and examined at bedside.  Sitting up eating breakfast.  States he is feeling good.  Denies CP, palpitations, n/v/d, weakness, dizziness and fatigue.  Admits to LUQ abdominal pain.  Nurse reports he is to be d/c today after dialysis.  Will make renal navigator aware in case he is able to be d/c early prior to outpatient appointment time of 12pm.    Objective Vitals:   02/12/20 2348 02/13/20 0421 02/13/20 0748 02/13/20 0808  BP: 120/67 109/76  (!) 110/53  Pulse: 89 88  96  Resp: 17 18  18   Temp: 98.4 F (36.9 C) 98.9 F (37.2 C)  100.1 F (37.8 C)  TempSrc: Axillary Oral  Oral  SpO2: 94% 94% 95% 95%  Weight:  66.9 kg    Height:       Physical Exam General:Chronically ill appearing male in NAD Heart:IRIR Lungs:+crackles in bases Abdomen:soft, NTND  Extremities:no LE edema Dialysis Access: LU AVG +b   Filed Weights   02/10/20 1937 02/11/20 1354 02/13/20 0421  Weight: 67.2 kg 67.3 kg 66.9 kg    Intake/Output Summary (Last 24 hours) at 02/13/2020 0832 Last data filed at 02/13/2020 0800 Gross per 24 hour  Intake 1326 ml  Output -  Net 1326 ml    Additional Objective Labs: Basic Metabolic Panel: Recent Labs  Lab 02/11/20 0239 02/11/20 1123 02/12/20 0431 02/13/20 0424  NA 135  --  134* 134*  K 4.8  --  3.6 4.2  CL 96*  --  94* 90*  CO2 27  --  29 28  GLUCOSE 88  --  123* 134*  BUN 40*  --  18 26*  CREATININE 4.15*  --  3.08* 5.02*  CALCIUM 8.7*  --  8.9 9.5  PHOS  --  6.5*  --   --    Liver Function Tests: Recent Labs  Lab 02/09/20 1344 02/12/20 0431 02/13/20 0424  AST 56* 74* 47*  ALT 97* 103* 82*  ALKPHOS 219* 222* 239*  BILITOT 1.8* 1.8* 1.3*  PROT 5.8* 6.1* 6.3*  ALBUMIN 2.6* 2.5* 2.5*   CBC: Recent Labs  Lab 02/09/20 1344 02/09/20 1344 02/10/20 0307 02/10/20 0307 02/11/20 0239 02/12/20 0431 02/13/20 0424  WBC 19.8*   < > 14.6*   < > 11.8* 10.1 13.0*  NEUTROABS 15.7*   <  >  --   --  8.9* 7.4 9.9*  HGB 10.4*   < > 9.8*   < > 9.2* 9.7* 10.0*  HCT 30.6*   < > 27.8*   < > 26.5* 27.9* 29.0*  MCV 81.6  --  80.1  --  79.3* 81.3 80.8  PLT 144*   < > 147*   < > 130* 136* 135*   < > = values in this interval not displayed.   Blood Culture    Component Value Date/Time   SDES BLOOD RIGHT HAND 02/04/2020 0930   SPECREQUEST  02/04/2020 0930    BOTTLES DRAWN AEROBIC AND ANAEROBIC Blood Culture results may not be optimal due to an inadequate volume of blood received in culture bottles   CULT  02/04/2020 0930    NO GROWTH 5 DAYS Performed at [facility], [place] [address] [place], [place] [address]    REPTSTATUS 02/09/2020 FINAL 02/04/2020 0930    Medications: . sodium chloride    . sodium chloride    . cefTRIAXone (ROCEPHIN)  IV 2 g (02/12/20 1734)   . (  feeding supplement) PROSource Plus  30 mL Oral BID BM  . calcitRIOL  0.5 mcg Oral Q M,W,F-HD  . Chlorhexidine Gluconate Cloth  6 each Topical Q0600  . darbepoetin (ARANESP) injection - DIALYSIS  60 mcg Intravenous Q Mon-HD  . digoxin  0.0625 mg Oral Q M,W,F  . diltiazem  120 mg Oral Daily  . doxycycline  100 mg Oral Q12H  . gabapentin  100 mg Oral TID  . heparin  5,000 Units Subcutaneous Q8H  . lactulose  10 g Oral BID  . mometasone-formoterol  2 puff Inhalation BID  . multivitamin  1 tablet Oral QHS  . sevelamer carbonate  3,200 mg Oral TID WC  . tenofovir  300 mg Oral Q Mon    Dialysis Orders: MonWedFri, 4 hrs 0 min, Optiflux 180NR, BFR 450, DFR Manual 800 mL/min, EDW 61 (kg), Dialysate 2.0 K, 2.0 Ca, 1.0 Mg, 100 Dextrose (G2201), Sodium 137 (mEq/L), Bicarb Setting: 35 (mEq/L), UFR Profile: None, Sodium Model: None, Access: AVGraft-Synthetic - Gore Acuseal..Marland KitchenNO HEPARINno ESA or Fe calcitriol 0.5 Recent hgb 12 Ca low 10s P ^ ipTH 254  Assessment/Plan: 1.SOB/acute hypoxic resp failure - Improved. Now on RA. CXR on admission with no evidence cardiopulmomary disease.  2. Diaz fib - PO digoxin  started by Dr. Einar Gip. Not Diaz candidate for ablation per EP. Per cards/primary 3. Hyperkalemia- resolved with HD. K 4.2. Usually runs high. URRs checked 3 x in July but ran low and he had abbreviated treatments all 3 times. Part of [place] is flat mid graft with out much of Diaz thrill there -may need Diaz shuntagram. Plan eval AVGG during treatments at Rx BFR and trend K/Cr in controlled environment.  K/Cr appropriately lowered with dialysis - fistulogram not needed at this time.     4. ESRD- On HD MWF. HD today per regular schedule.  Make renal navigator aware of possible d/c today so he can go to OP unit if possible, will get HD here if unable to arrange. No heparin. 5. Anemiaof CKD-Hgb 10.0. Aranesp 61mcg qwk (Mon)  6. Secondary hyperparathyroidism- CCa has been elevated. VDRA recently lowered as OP, continue for now. Use 2Ca bath. last Phos 6.5, continue binders.  7.HTN/volume- BP soft, limits UF goal. Does not appear grossly volume overloaded. UF as tolerated. 8. Nutrition- Renal diet with fluid restrictions. Low albumin. +Supplements.  9. Chronic Hep B/Hep C/Cirrhosis/recurrent ascites- weekly paracentesis. Per primary 10. COPD 57. Chronic pain syndrome 12. CODE status - changed to DNR after palliative consult, appreciate their assistance.  Joshua Mow, PA-C [facility] 02/13/2020,8:32 AM  LOS: 4 days   I have seen and examined this patient and agree with plan and assessment in the above note with renal recommendations/intervention highlighted.  Stable for discharge and fu with outpatient HD today. [name] [place],MD 02/13/2020 10:46 AM

## 2020-02-13 NOTE — Discharge Summary (Signed)
Physician Discharge Summary  [name] DGL:[id number] DOB: 1961/02/20 DOA: 02/09/2020  PCP: [name], FNP  Admit date: 02/09/2020 Discharge date: 02/13/2020  Admitted From: Home Disposition: Home  Recommendations for Outpatient Follow-up:  1. Follow up with PCP in 1-2 weeks 2. Follow with primary cardiologist within 1 week 3. Please obtain BMP/CBC in one week 4. Please follow up with your PCP on the following pending results: Unresulted Labs (From admission, onward) Comment          Start     Ordered   02/10/20 5188  Basic metabolic panel  Daily,   R     Question:  Specimen collection method  Answer:  Lab=Lab collect   02/10/20 0657   Signed and Held  Renal function panel  Once,   R       Question:  Specimen collection method  Answer:  Lab=Lab collect   Signed and Held   Signed and Held  CBC  Once,   R       Question:  Specimen collection method  Answer:  Lab=Lab collect   Signed and Held   Signed and Held  Renal function panel  Once,   R       Question:  Specimen collection method  Answer:  Lab=Lab collect   Signed and Held   Signed and Held  CBC  Once,   R       Question:  Specimen collection method  Answer:  Lab=Lab collect   Signed and Held   Signed and Held  Renal function panel  Once,   R       Question:  Specimen collection method  Answer:  Lab=Lab collect   Signed and Held   Signed and Held  CBC  Once,   R       Question:  Specimen collection method  Answer:  Lab=Lab collect   Signed and Held           Home Health: None Equipment/Devices: None  Discharge Condition: Stable CODE STATUS: DNR Diet recommendation: Cardiac  Subjective: Seen and examined. He had no complaint other than feeling weak. Despite of this, he wanted to go home.  Brief/Interim Summary: 59 year old male who is a poor historian with past medical history of ESRD on dialysis MWF, COPD, prior alcohol abuse, A. fib with poor compliance(patient of Dr. Einar Gip), cirrhosis with  recurrent ascites and weekly paracentesis, chronic pain who was seen in the ED multiple times over the past week for diastolic heart failure exacerbation, A. fib, hyperkalemia most recently signed out [place] on 7/27 when he was being treated for A. fib with RVR and recently seen in the ED on 02/08/2020 after being brought in by ambulance for shortness of breath and dizziness with wide-complex tachycardia and given amiodarone at the time who went home and presented again on 02/09/2020 for shortness of breath and palpitations.  Upon EMS arrival, his heart rate was in the 160s and received 6 mg followed by 12 mg of adenosine with no change in his rate and subsequently required 20 mg of Cardizem.  In the ED, he was still tachycardic up to 140s.  Cardiology/Dr. Einar Gip was consulted.  Chest x-ray showed mild cardiomegaly with no acute cardiopulmonary disease.  He was started on Cardizem drip. He was admitted to hospitalist service. Nephrology was also consulted due to hemodialysis as he is a patient with end-stage renal disease. Patient also presented with acute metabolic encephalopathy which was thought to be likely secondary  to his atrial fibrillation or acute hepatic encephalopathy and patient was started on lactulose however patient refused that multiple times. Patient was also thought to be having presumed community-acquired pneumonia and had acute hypoxic respiratory failure because of that. He was started on Rocephin and doxycycline and completed 5-day therapy. Subsequently, his atrial fibrillation was controlled, he was switched from IV Cardizem to oral Cardizem. Palliative care also saw patient and his CODE STATUS was changed from full code to DNR. He was seen by Dr. Darrick Penna primary cardiologist who had consulted EP cardiology. By the time, he was seen by EP cardiology, his rate control was established. Following is the copy paste from EP cardiology consultant.  "Rate control has been established Rhythm control  not an option for him with the inability to anticoagulate maintenance of rhythm highly unlikely given his p.HTN, CorPulmonale, severely enlarged atria and recurrent noncompliance I do not this he is an ablation candidate given all the above as well.  His current regime has gained rate control  Pace and ablate strategy undesirable as well with significantly increased infection rate, and should he run into RV pacing induced CM would certainly be very problematic for this patient  Rate control has been established in the last 24 hours, monitor dig closely given ERSF/HD.  He is not on BB here, was on metoprolol at home and high dilt dose as well, would continue to work with these to his BP tolerance Will defer titration of his meds to Dr. Einar Gip  I have seen, examined the patient, and reviewed the above assessment and plan.  Changes to above are made where necessary.  On exam, chronically ill, iRRR.  The patient has multiple comorbidities as well as noncompliance which complicates care. I would not advise AV nodal ablation.   One option would be to consider tertiary referral (electively as an outpatient) to Dr Willis Modena at [facility] to consider convergent procedure.  This may be an effective way to manage his refractory atrial arrhythmias."  I spoke to Dr. Einar Gip personally over the phone today about EP cardiology is recommendations about referral to [facility] to Dr. Willis Modena for consideration of convergent procedure. Dr. Einar Gip told me that he will do the referral as an outpatient and cleared the patient for discharge as patient was feeling better. Patient was also started on digoxin here and his Cardizem was reduced in half. Patient was discharged on medications recommended by Dr. Einar Gip. Per Dr. Einar Gip, patient's beta-blocker has been held due to poor/low blood pressure.   Discharge Diagnoses:  Principal Problem:   Atrial fibrillation with RVR ([place]) Active Problems:   Chronic hepatitis B ([facility])   ESRD on dialysis  ([place])   Cirrhosis of liver with ascites ([place])   CAP (community acquired pneumonia)   DNR (do not resuscitate)    Discharge Instructions   Allergies as of 02/13/2020      Reactions   Tramadol Itching, Other (See Comments)   Morphine And Related Other (See Comments)   Stomach pain   Pollen Extract Other (See Comments)   Other reaction(s): Sneezing (finding)   Acetaminophen Nausea Only   Stomach ache   Aspirin Itching, Other (See Comments)   STOMACH PAIN   Clonidine Derivatives Itching      Medication List    STOP taking these medications   magic mouthwash w/lidocaine Soln   metoprolol tartrate 25 MG tablet Commonly known as: LOPRESSOR     TAKE these medications   albuterol 108 (90 Base) MCG/ACT inhaler Commonly known  as: VENTOLIN HFA Inhale 2 puffs into the lungs every 4 (four) hours as needed for wheezing or shortness of breath.   amiodarone 200 MG tablet Commonly known as: PACERONE Take 1 tablet (200 mg total) by mouth daily.   cefdinir 300 MG capsule Commonly known as: OMNICEF Take 1 capsule (300 mg total) by mouth 3 (three) times a week for 4 doses. Please take it after dialysis on days of dialysis which will be MWF.   Digoxin 62.5 MCG Tabs Take 0.0625 mg by mouth every Monday, Wednesday, and Friday. Start taking on: February 15, 2020   diltiazem 120 MG 24 hr capsule Commonly known as: CARDIZEM CD Take 1 capsule (120 mg total) by mouth daily. Start taking on: February 14, 2020 What changed:   medication strength  how much to take   diphenhydrAMINE 25 mg capsule Commonly known as: BENADRYL Take 1 capsule (25 mg total) by mouth every 6 (six) hours as needed for itching or allergies.   gabapentin 100 MG capsule Commonly known as: NEURONTIN Take 1 capsule (100 mg total) by mouth 3 (three) times daily. What changed:   medication strength  how much to take  when to take this   Oxycodone HCl 10 MG Tabs Take 10 mg by mouth 3 (three) times daily as needed  (pain).   sevelamer carbonate 800 MG tablet Commonly known as: RENVELA Take 2,400 mg by mouth See admin instructions. Take 3 tablets (2400 mg) by mouth up to three times daily with meals   tenofovir 300 MG tablet Commonly known as: VIREAD Take 300 mg by mouth every Monday.       Follow-up Information    [name], FNP Follow up in 1 week(s).   Specialty: Family Medicine Contact information: [place] [id number] [phone]              Allergies  Allergen Reactions  . Tramadol Itching and Other (See Comments)  . Morphine And Related Other (See Comments)    Stomach pain  . Pollen Extract Other (See Comments)    Other reaction(s): Sneezing (finding)  . Acetaminophen Nausea Only    Stomach ache   . Aspirin Itching and Other (See Comments)    STOMACH PAIN   . Clonidine Derivatives Itching    Consultations: Cardiology, EP cardiology and nephrology   Procedures/Studies: DG Chest 2 View  Result Date: 02/09/2020 CLINICAL DATA:  59 year old with palpitations, generalized weakness and shortness of breath. The patient was seen in the emergency department yesterday for atrial fibrillation with rapid ventricular response, and recurrence of the atrial fibrillation was noted upon EMS arrival. Patient had successful cardioversion with Cardizem after unsuccessful attempts with adenosine. EXAM: CHEST - 2 VIEW COMPARISON:  02/08/2020 and earlier. FINDINGS: Cardiac silhouette markedly enlarged, unchanged. Thoracic aorta and proximal great vessels severely atherosclerotic. Prominent central pulmonary arteries, unchanged. Lungs clear. Mild pulmonary venous hypertension without overt edema. No visible pleural effusions. IMPRESSION: Stable marked cardiomegaly. No acute cardiopulmonary disease. Electronically Signed   By: Evangeline Dakin M.D.   On: 02/09/2020 14:40   DG Chest 2 View  Result Date: 02/03/2020 CLINICAL DATA:  59 year old male with chest pain and  shortness of breath. EXAM: CHEST - 2 VIEW COMPARISON:  Chest radiograph dated 01/25/2020. FINDINGS: There is mild eventration of the left hemidiaphragm with left lung base atelectasis. There is no focal consolidation, pleural effusion, or pneumothorax. Stable cardiomegaly with probable mild vascular congestion. No edema. Atherosclerotic calcification of the aorta. No acute  osseous pathology. IMPRESSION: Cardiomegaly with probable mild vascular congestion. No focal consolidation. Electronically Signed   By: Anner Crete M.D.   On: 02/03/2020 20:37   DG Chest 2 View  Result Date: 01/20/2020 CLINICAL DATA:  Burning sensation in chest. EXAM: CHEST - 2 VIEW COMPARISON:  January 19, 2020 FINDINGS: Stable cardiomegaly. Hila and mediastinum are normal. Mild haziness in the periphery of the lungs bilaterally. Mild atelectasis in the left base. No other acute abnormalities. IMPRESSION: Mild haziness in the peripheries of the lungs bilaterally may represent overlapping soft tissues or subtle developing infiltrates. Recommend clinical correlation and close attention on follow-up. Electronically Signed   By: Dorise Bullion III M.D   On: 01/20/2020 16:23   DG Chest 2 View  Result Date: 01/19/2020 CLINICAL DATA:  Shortness of breath, productive cough. Dialysis yesterday. EXAM: CHEST - 2 VIEW COMPARISON:  Chest x-rays dated 01/14/2020 and 01/13/2020. Chest CT angiogram dated 01/14/2020. FINDINGS: Stable cardiomegaly. Lungs are clear. No pleural effusion or pneumothorax is seen. IMPRESSION: 1. Lungs are clear. No evidence of pneumonia or pulmonary edema on today's exam. 2. Stable cardiomegaly. Electronically Signed   By: Franki Cabot M.D.   On: 01/19/2020 08:22   CT Angio Chest PE W and/or Wo Contrast  Result Date: 01/14/2020 CLINICAL DATA:  Shortness of breath. Soft dialysis prematurely secondary to itching. Recent hospital discharge. EXAM: CT ANGIOGRAPHY CHEST WITH CONTRAST TECHNIQUE: Multidetector CT imaging of the  chest was performed using the standard protocol during bolus administration of intravenous contrast. Multiplanar CT image reconstructions and MIPs were obtained to evaluate the vascular anatomy. CONTRAST:  92mL OMNIPAQUE IOHEXOL 350 MG/ML SOLN COMPARISON:  CT chest 12/23/2019 FINDINGS: Cardiovascular: Satisfactory opacification the pulmonary arteries to the segmental level. No pulmonary artery filling defects are identified. Central pulmonary arteries are normal caliber. Suboptimal opacification of the aorta for luminal evaluation. Atherosclerotic plaque within the normal caliber aorta. Normal 3 vessel branching of the aortic arch. Extensive atheromatous plaque throughout the proximal great vessels as included. Mild cardiomegaly with predominantly biatrial enlargement. Dense calcifications noted upon the coronary arteries as well as upon the mitral annulus and aortic leaflets. Small pericardial effusion. Distension of the azygos. Reflux of contrast into the IVC and hepatic veins. No other major venous abnormalities on this arterial phase imaging. Mediastinum/Nodes: Edematous changes the mediastinum. Normal thyroid gland and thoracic inlet. No acute abnormality of the trachea or esophagus. No worrisome mediastinal, hilar or axillary adenopathy. Lungs/Pleura: Small bilateral pleural effusions with adjacent areas of passive atelectasis. Septal and fissural thickening is present with vascular redistribution. Some dependent areas of ground-glass opacity favoring a combination of passive and dependent atelectasis. No worrisome consolidative opacity. No pneumothorax. Stable biapical pleuroparenchymal scarring. No worrisome nodules or masses. Upper Abdomen: Upper abdominal ascites. No other acute upper abdominal abnormality is visible. Musculoskeletal: Diffuse body wall edema. Osseous manifestations of renal osteodystrophy. No worrisome bone or chest wall lesions. Review of the MIP images confirms the above findings.  IMPRESSION: 1. No evidence of acute pulmonary artery filling defects to suggest pulmonary embolism. 2. Cardiomegaly with predominantly biatrial enlargement. Small pericardial effusion. 3. Reflux of contrast into the IVC and hepatic veins, can be seen with right heart failure/fluid overload. 4. Small bilateral pleural effusions with adjacent areas of passive atelectasis. 5. Fissural and septal thickening with vascular redistribution likely to reflect some interstitial edema. 6. Diffuse body wall edema, upper abdominal ascites, and body wall edema, consistent with anasarca. 7. Osseous manifestations of renal osteodystrophy. 8. Aortic Atherosclerosis (ICD10-I70.0). Electronically Signed  By: Lovena Le M.D.   On: 01/14/2020 21:00   US Abdomen Limited  Result Date: 02/10/2020 CLINICAL DATA:  History of cirrhosis. Please perform ascites search ultrasound and ultrasound-guided paracentesis for therapeutic purposes as indicated. EXAM: LIMITED ABDOMEN ULTRASOUND FOR ASCITES TECHNIQUE: Limited ultrasound survey for ascites was performed in all four abdominal quadrants. COMPARISON:  Multiple previous ultrasound-guided paracenteses, most recently on 02/07/2020 yielding 780 cc of peritoneal fluid. FINDINGS: Sonographic evaluation of the abdomen demonstrates a trace amount of intra-abdominal ascites, too small to allow for safe ultrasound-guided paracentesis. No paracentesis attempted. IMPRESSION: Trace amount of intra-abdominal ascites, too small to allow for safe ultrasound-guided paracentesis. Electronically Signed   By: Sandi Mariscal M.D.   On: 02/10/2020 10:15   DG Chest Port 1 View  Result Date: 02/08/2020 CLINICAL DATA:  V-tach EXAM: PORTABLE CHEST 1 VIEW COMPARISON:  February 03, 2020 FINDINGS: Again noted is mild cardiomegaly. Aortic knob calcifications. There is prominence of the central pulmonary vasculature. No large airspace consolidation or pleural effusion. There is stable elevation of the left hemidiaphragm.  IMPRESSION: Mild cardiomegaly and pulmonary vascular congestion. Electronically Signed   By: Prudencio Pair M.D.   On: 02/08/2020 22:30   DG Chest Port 1 View  Result Date: 01/25/2020 CLINICAL DATA:  Shortness of breath, end-stage renal disease, hepatitis, cirrhosis EXAM: PORTABLE CHEST 1 VIEW COMPARISON:  01/20/2020 FINDINGS: Single frontal view of the chest demonstrates persistent enlargement of the cardiac silhouette. Diffuse increased interstitial prominence since prior study likely reflects interstitial edema. No airspace disease, effusion, or pneumothorax. No acute bony abnormalities. IMPRESSION: 1. Increased interstitial prominence compatible with interstitial edema. Electronically Signed   By: Randa Ngo M.D.   On: 01/25/2020 22:52   DG Chest Portable 1 View  Result Date: 01/14/2020 CLINICAL DATA:  Shortness of breath. EXAM: PORTABLE CHEST 1 VIEW COMPARISON:  January 13, 2020 FINDINGS: Mild, diffuse, chronic appearing increased lung markings are seen. There is decreased vascular congestion since the prior study. Mild, hazy areas of atelectasis and/or early infiltrate are seen along the periphery of the mid left lung. There is no evidence of a pleural effusion or pneumothorax. The cardiac silhouette is markedly enlarged. There is mild calcification of the aortic arch. The visualized skeletal structures are unremarkable. IMPRESSION: 1. Mild, hazy areas of atelectasis and/or early infiltrate along the periphery of the mid left lung. 2. Decreased pulmonary vascular congestion when compared to the prior exam. Electronically Signed   By: Virgina Norfolk M.D.   On: 01/14/2020 18:29   [place] ASCITES (ABDOMEN LIMITED)  Result Date: 01/14/2020 CLINICAL DATA:  Assess ascites. Multiple previous paracenteses due to cirrhosis. EXAM: LIMITED ABDOMEN ULTRASOUND FOR ASCITES TECHNIQUE: Limited ultrasound survey for ascites was performed in all four abdominal quadrants. COMPARISON:  12/22/2019 FINDINGS: Examination  demonstrates mild-to-moderate amount of ascites left greater than right. Ascites over the right abdomen is improved compared to the images from the previous exam. IMPRESSION: Mild-to-moderate ascites left greater than right. Electronically Signed   By: Marin Olp M.D.   On: 01/14/2020 21:52   ECHOCARDIOGRAM LIMITED  Result Date: 02/05/2020    ECHOCARDIOGRAM LIMITED REPORT   Patient Name:   [name] Date of Exam: 02/05/2020 Medical Rec #:  [id number]              Height:       69.0 in Accession #:    [id number]             Weight:       155.0  lb Date of Birth:  31-Oct-1960               BSA:          1.854 m Patient Age:    54 years               BP:           101/74 mmHg Patient Gender: M                      HR:           77 bpm. Exam Location:  Inpatient Procedure: 2D Echo Indications:    Shortness of breath  History:        Patient has prior history of Echocardiogram examinations.  Sonographer:    Raquel Sarna Senior Referring Phys: [place]  1. Left ventricular ejection fraction, by estimation, is 50 to 55%. The left ventricle has normal function. There is severe left ventricular hypertrophy. Left ventricular diastolic function could not be evaluated. I cannot exclude septal hypokinesis. Endocardium not well visualized. septal motion also is consistent with LBBB.  2. Right ventricular systolic function is mildly reduced. The right ventricular size is moderately enlarged. Severely increased right ventricular wall thickness. There is moderately elevated pulmonary artery systolic pressure. The estimated right ventricular systolic pressure is 51.8 mmHg.  3. Left atrial size was severely dilated.  4. Right atrial size was severely dilated.  5. The mitral valve is normal in structure. Mild mitral valve regurgitation.  6. Dilated TV annulus. Tricuspid valve regurgitation is moderate to severe.  7. AV sclerosis without stenosis. The aortic valve is tricuspid. Aortic valve regurgitation is  trivial. Mild aortic valve sclerosis is present, with no evidence of aortic valve stenosis. Aortic valve mean gradient measures 8.3 mmHg.  8. The inferior vena cava is dilated in size with <50% respiratory variability, suggesting right atrial pressure of 15 mmHg. FINDINGS  Left Ventricle: Left ventricular ejection fraction, by estimation, is 50 to 55%. The left ventricle has normal function. There is severe left ventricular hypertrophy. Abnormal (paradoxical) septal motion, consistent with left bundle branch block.  LV Wall Scoring: I cannot exclude septal hypokinesis. Endocardium not well visualized. septal motion also is consistent with LBBB. Right Ventricle: The right ventricular size is moderately enlarged. Severely increased right ventricular wall thickness. Right ventricular systolic function is mildly reduced. There is moderately elevated pulmonary artery systolic pressure. The tricuspid  regurgitant velocity is 3.34 m/s, and with an assumed right atrial pressure of 15 mmHg, the estimated right ventricular systolic pressure is 84.1 mmHg. Left Atrium: Left atrial size was severely dilated. Right Atrium: Right atrial size was severely dilated. Pericardium: Trivial pericardial effusion is present. Mitral Valve: The mitral valve is normal in structure. Mild mitral valve regurgitation. MV peak gradient, 20.1 mmHg. The mean mitral valve gradient is 6.0 mmHg. Tricuspid Valve: Dilated TV annulus. The tricuspid valve is normal in structure. Tricuspid valve regurgitation is moderate to severe. Aortic Valve: AV sclerosis without stenosis. The aortic valve is tricuspid. Aortic valve regurgitation is trivial. Mild aortic valve sclerosis is present, with no evidence of aortic valve stenosis. There is mild calcification of the aortic valve. Aortic valve mean gradient measures 8.3 mmHg. Aortic valve peak gradient measures 14.0 mmHg. Aortic valve area, by VTI measures 1.59 cm. Pulmonic Valve: The pulmonic valve was normal in  structure. Pulmonic valve regurgitation is trivial. Aorta: The aortic root is normal in size and structure. Pulmonary Artery: The pulmonary  artery is not well seen. Venous: The inferior vena cava is dilated in size with less than 50% respiratory variability, suggesting right atrial pressure of 15 mmHg. IAS/Shunts: No atrial level shunt detected by color flow Doppler. Additional Comments: There is no pleural effusion. LEFT VENTRICLE PLAX 2D LVOT diam:     2.00 cm LV SV:         62 LV SV Index:   33 LVOT Area:     3.14 cm  AORTIC VALVE AV Area (Vmax):    1.46 cm AV Area (Vmean):   1.60 cm AV Area (VTI):     1.59 cm AV Vmax:           187.00 cm/s AV Vmean:          132.000 cm/s AV VTI:            0.389 m AV Peak Grad:      14.0 mmHg AV Mean Grad:      8.3 mmHg LVOT Vmax:         86.80 cm/s LVOT Vmean:        67.100 cm/s LVOT VTI:          0.197 m LVOT/AV VTI ratio: 0.51 MITRAL VALVE              TRICUSPID VALVE MV Area (PHT): 1.22 cm   TR Peak grad:   44.6 mmHg MV Peak grad:  20.1 mmHg  TR Vmax:        334.00 cm/s MV Mean grad:  6.0 mmHg MV Vmax:       2.24 m/s   SHUNTS MV Vmean:      103.0 cm/s Systemic VTI:  0.20 m                           Systemic Diam: 2.00 cm Adrian Prows MD Electronically signed by Adrian Prows MD Signature Date/Time: 02/05/2020/10:08:07 AM    Final    IR Paracentesis  Result Date: 02/07/2020 INDICATION: End-stage renal disease, cirrhosis, hepatitis B and C, recurrent ascites. Request for therapeutic paracentesis. EXAM: ULTRASOUND GUIDED PARACENTESIS MEDICATIONS: 1% lidocaine 10 mL COMPLICATIONS: None immediate. PROCEDURE: Informed written consent was obtained from the patient after a discussion of the risks, benefits and alternatives to treatment. A timeout was performed prior to the initiation of the procedure. Initial ultrasound scanning demonstrates a moderate amount of ascites within the left lateral abdomen. The left lateral abdomen was prepped and draped in the usual sterile fashion. 1%  lidocaine was used for local anesthesia. Following this, a 19 gauge, 7-cm, Yueh catheter was introduced. An ultrasound image was saved for documentation purposes. The paracentesis was performed. The catheter was removed and a dressing was applied. The patient tolerated the procedure well without immediate post procedural complication. FINDINGS: A total of approximately 780 mL of clear yellow fluid was removed. IMPRESSION: Successful ultrasound-guided paracentesis yielding 780 mL of peritoneal fluid. Read by: Gareth Eagle, PA-C Electronically Signed   By: Corrie Mckusick D.O.   On: 02/07/2020 13:58   IR Paracentesis  Result Date: 01/31/2020 INDICATION: Patient with cirrhosis, recurrent ascites. Request made for therapeutic paracentesis. EXAM: ULTRASOUND GUIDED THERAPEUTIC PARACENTESIS MEDICATIONS: 10 ML 1% LIDOCAINE COMPLICATIONS: None immediate. PROCEDURE: Informed written consent was obtained from the patient after a discussion of the risks, benefits and alternatives to treatment. A timeout was performed prior to the initiation of the procedure. Initial ultrasound scanning demonstrates a small amount of ascites within the left  lateral abdomen. The left lateral abdomen was prepped and draped in the usual sterile fashion. 1% lidocaine was used for local anesthesia. Following this, a 6 Fr Safe-T-Centesis catheter was introduced. An ultrasound image was saved for documentation purposes. The paracentesis was performed. The catheter was removed and a dressing was applied. The patient tolerated the procedure well without immediate post procedural complication. FINDINGS: A total of approximately 5.0 liters of yellow fluid was removed. Samples were sent to the laboratory as requested by the clinical team. IMPRESSION: Successful ultrasound-guided therapeutic paracentesis yielding 5.0 liters of peritoneal fluid. Read by: Brynda Greathouse PA-C Electronically Signed   By: Corrie Mckusick D.O.   On: 01/31/2020 14:45   IR  Paracentesis  Result Date: 01/24/2020 INDICATION: End-stage renal disease, cirrhosis, hepatitis B and C, recurrent ascites. Request for therapeutic paracentesis EXAM: ULTRASOUND GUIDED PARACENTESIS MEDICATIONS: 1% lidocaine 15 mL COMPLICATIONS: None immediate. PROCEDURE: Informed written consent was obtained from the patient after a discussion of the risks, benefits and alternatives to treatment. A timeout was performed prior to the initiation of the procedure. Initial ultrasound scanning demonstrates a moderate amount of ascites within the left lower abdominal quadrant. The left lower abdomen was prepped and draped in the usual sterile fashion. 1% lidocaine was used for local anesthesia. Following this, a 6 Fr Safe-T-Centesis catheter was introduced. An ultrasound image was saved for documentation purposes. The paracentesis was performed. The catheter was removed and a dressing was applied. The patient tolerated the procedure well without immediate post procedural complication. FINDINGS: A total of approximately 1.4 L of amber fluid was removed. IMPRESSION: Successful ultrasound-guided paracentesis yielding 1.4 liters of peritoneal fluid. Read by: Gareth Eagle, PA-C Electronically Signed   By: Aletta Edouard M.D.   On: 01/24/2020 14:49   IR Paracentesis  Result Date: 01/17/2020 INDICATION: Patient with history end-stage renal disease, cirrhosis, hepatitis B/C, recurrent ascites ;request made for therapeutic paracentesis EXAM: ULTRASOUND GUIDED THERAPEUTIC  PARACENTESIS MEDICATIONS: None COMPLICATIONS: None immediate. PROCEDURE: Informed written consent was obtained from the patient after a discussion of the risks, benefits and alternatives to treatment. A timeout was performed prior to the initiation of the procedure. Initial ultrasound scanning demonstrates a moderate amount of ascites within the left lower abdominal quadrant. The left lower abdomen was prepped and draped in the usual sterile fashion. 1%  lidocaine was used for local anesthesia. Following this, a 6 Fr Safe-T-Centesis catheter was introduced. An ultrasound image was saved for documentation purposes. The paracentesis was performed. The catheter was removed and a dressing was applied. The patient tolerated the procedure well without immediate post procedural complication. FINDINGS: A total of approximately 3.2 liters of hazy,amber fluid was removed. IMPRESSION: Successful ultrasound-guided therapeutic paracentesis yielding 3.2 liters of peritoneal fluid. Read by: Lindaann Pascal Electronically Signed   By: Markus Daft M.D.   On: 01/17/2020 13:42      Discharge Exam: Vitals:   02/13/20 0748 02/13/20 0808  BP:  (!) 110/53  Pulse:  96  Resp:  18  Temp:  100.1 F (37.8 C)  SpO2: 95% 95%   Vitals:   02/12/20 2348 02/13/20 0421 02/13/20 0748 02/13/20 0808  BP: 120/67 109/76  (!) 110/53  Pulse: 89 88  96  Resp: 17 18  18   Temp: 98.4 F (36.9 C) 98.9 F (37.2 C)  100.1 F (37.8 C)  TempSrc: Axillary Oral  Oral  SpO2: 94% 94% 95% 95%  Weight:  66.9 kg    Height:        General:  Pt is alert, awake, not in acute distress Cardiovascular: Irregularly irregular rate and rhythm, S1/S2 +, no rubs, no gallops Respiratory: CTA bilaterally, no wheezing, no rhonchi Abdominal: Soft, NT, ND, bowel sounds + Extremities: no edema, no cyanosis    The results of significant diagnostics from this hospitalization (including imaging, microbiology, ancillary and laboratory) are listed below for reference.     Microbiology: Recent Results (from the past 240 hour(s))  SARS Coronavirus 2 by RT PCR (hospital order, performed in [facility] hospital lab) Nasopharyngeal Nasopharyngeal Swab     Status: None   Collection Time: 02/03/20  9:25 PM   Specimen: Nasopharyngeal Swab  Result Value Ref Range Status   SARS Coronavirus 2 NEGATIVE NEGATIVE Final    Comment: (NOTE) SARS-CoV-2 target nucleic acids are NOT DETECTED.  The SARS-CoV-2 RNA  is generally detectable in upper and lower respiratory specimens during the acute phase of infection. The lowest concentration of SARS-CoV-2 viral copies this assay can detect is 250 copies / mL. A negative result does not preclude SARS-CoV-2 infection and should not be used as the sole basis for treatment or other patient management decisions.  A negative result may occur with improper specimen collection / handling, submission of specimen other than nasopharyngeal swab, presence of viral mutation(s) within the areas targeted by this assay, and inadequate number of viral copies (<250 copies / mL). A negative result must be combined with clinical observations, patient history, and epidemiological information.  Fact Sheet for Patients:   StrictlyIdeas.no  Fact Sheet for Healthcare Providers: BankingDealers.co.za  This test is not yet approved or  cleared by the [place] FDA and has been authorized for detection and/or diagnosis of SARS-CoV-2 by FDA under an Emergency Use Authorization (EUA).  This EUA will remain in effect (meaning this test can be used) for the duration of the COVID-19 declaration under Section 564(b)(1) of the Act, 21 U.S.C. section 360bbb-3(b)(1), unless the authorization is terminated or revoked sooner.  Performed at [facility], [place] [address] [place], [place] [address]   Culture, blood (routine x 2)     Status: None   Collection Time: 02/04/20  9:10 AM   Specimen: BLOOD LEFT WRIST  Result Value Ref Range Status   Specimen Description BLOOD LEFT WRIST  Final   Special Requests   Final    BOTTLES DRAWN AEROBIC AND ANAEROBIC Blood Culture adequate volume   Culture   Final    NO GROWTH 5 DAYS Performed at [facility], [place] [address], [place], [place] [address]    Report Status 02/09/2020 FINAL  Final  Culture, blood (routine x 2)     Status: None   Collection Time: 02/04/20  9:30 AM    Specimen: BLOOD RIGHT HAND  Result Value Ref Range Status   Specimen Description BLOOD RIGHT HAND  Final   Special Requests   Final    BOTTLES DRAWN AEROBIC AND ANAEROBIC Blood Culture results may not be optimal due to an inadequate volume of blood received in culture bottles   Culture   Final    NO GROWTH 5 DAYS Performed at [facility], [place] [address], [place], [place] [address]    Report Status 02/09/2020 FINAL  Final  SARS Coronavirus 2 by RT PCR (hospital order, performed in [facility] hospital lab) Nasopharyngeal Nasopharyngeal Swab     Status: None   Collection Time: 02/09/20  3:20 PM   Specimen: Nasopharyngeal Swab  Result Value Ref Range Status  SARS Coronavirus 2 NEGATIVE NEGATIVE Final    Comment: (NOTE) SARS-CoV-2 target nucleic acids are NOT DETECTED.  The SARS-CoV-2 RNA is generally detectable in upper and lower respiratory specimens during the acute phase of infection. The lowest concentration of SARS-CoV-2 viral copies this assay can detect is 250 copies / mL. A negative result does not preclude SARS-CoV-2 infection and should not be used as the sole basis for treatment or other patient management decisions.  A negative result may occur with improper specimen collection / handling, submission of specimen other than nasopharyngeal swab, presence of viral mutation(s) within the areas targeted by this assay, and inadequate number of viral copies (<250 copies / mL). A negative result must be combined with clinical observations, patient history, and epidemiological information.  Fact Sheet for Patients:   StrictlyIdeas.no  Fact Sheet for Healthcare Providers: BankingDealers.co.za  This test is not yet approved or  cleared by the [place] FDA and has been authorized for detection and/or diagnosis of SARS-CoV-2 by FDA under an Emergency Use Authorization (EUA).  This EUA will remain in effect (meaning this  test can be used) for the duration of the COVID-19 declaration under Section 564(b)(1) of the Act, 21 U.S.C. section 360bbb-3(b)(1), unless the authorization is terminated or revoked sooner.  Performed at [facility], [place] [address], [place], [place] [address]      Labs: BNP (last 3 results) Recent Labs    10/06/19 1159 11/12/19 2341 01/14/20 1927  BNP 2,690.1* 3,672.9* 8,588.5*   Basic Metabolic Panel: Recent Labs  Lab 02/09/20 1344 02/09/20 1905 02/10/20 0307 02/11/20 0239 02/11/20 1123 02/12/20 0431 02/13/20 0424  NA 133*  --  132* 135  --  134* 134*  K 5.5*  --  6.3* 4.8  --  3.6 4.2  CL 91*  --  90* 96*  --  94* 90*  CO2 25  --  26 27  --  29 28  GLUCOSE 84  --  77 88  --  123* 134*  BUN 56*  --  69* 40*  --  18 26*  CREATININE 4.26*  --  5.09* 4.15*  --  3.08* 5.02*  CALCIUM 9.2  --  9.1 8.7*  --  8.9 9.5  MG  --  2.3  --   --   --   --  2.2  PHOS  --   --   --   --  6.5*  --   --    Liver Function Tests: Recent Labs  Lab 02/08/20 0254 02/08/20 2127 02/09/20 1344 02/12/20 0431 02/13/20 0424  AST 76* 65* 56* 74* 47*  ALT 137* 122* 97* 103* 82*  ALKPHOS 243* 250* 219* 222* 239*  BILITOT 1.3* 1.4* 1.8* 1.8* 1.3*  PROT 6.3* 6.6 5.8* 6.1* 6.3*  ALBUMIN 2.9* 2.9* 2.6* 2.5* 2.5*   No results for input(s): LIPASE, AMYLASE in the last 168 hours. Recent Labs  Lab 02/11/20 0239 02/12/20 0431  AMMONIA 36* 35   CBC: Recent Labs  Lab 02/08/20 2127 02/08/20 2147 02/09/20 1344 02/10/20 0307 02/11/20 0239 02/12/20 0431 02/13/20 0424  WBC 15.0*   < > 19.8* 14.6* 11.8* 10.1 13.0*  NEUTROABS 12.2*  --  15.7*  --  8.9* 7.4 9.9*  HGB 11.3*   < > 10.4* 9.8* 9.2* 9.7* 10.0*  HCT 32.8*   < > 30.6* 27.8* 26.5* 27.9* 29.0*  MCV 80.0   < > 81.6 80.1 79.3* 81.3 80.8  PLT 173   < >  144* 147* 130* 136* 135*   < > = values in this interval not displayed.   Cardiac Enzymes: No results for input(s): CKTOTAL, CKMB, CKMBINDEX, TROPONINI in the last 168  hours. BNP: Invalid input(s): POCBNP CBG: No results for input(s): GLUCAP in the last 168 hours. D-Dimer No results for input(s): DDIMER in the last 72 hours. Hgb A1c No results for input(s): HGBA1C in the last 72 hours. Lipid Profile No results for input(s): CHOL, HDL, LDLCALC, TRIG, CHOLHDL, LDLDIRECT in the last 72 hours. Thyroid function studies No results for input(s): TSH, T4TOTAL, T3FREE, THYROIDAB in the last 72 hours.  Invalid input(s): FREET3 Anemia work up No results for input(s): VITAMINB12, FOLATE, FERRITIN, TIBC, IRON, RETICCTPCT in the last 72 hours. Urinalysis    Component Value Date/Time   COLORURINE YELLOW 07/16/2011 1619   APPEARANCEUR CLEAR 07/16/2011 1619   LABSPEC 1.018 07/16/2011 1619   PHURINE 7.5 07/16/2011 1619   GLUCOSEU 250 (A) 07/16/2011 1619   HGBUR MODERATE (A) 07/16/2011 1619   BILIRUBINUR SMALL (A) 07/16/2011 1619   KETONESUR NEGATIVE 07/16/2011 1619   PROTEINUR >300 (A) 07/16/2011 1619   UROBILINOGEN 1.0 07/16/2011 1619   NITRITE NEGATIVE 07/16/2011 1619   LEUKOCYTESUR SMALL (A) 07/16/2011 1619   Sepsis Labs Invalid input(s): PROCALCITONIN,  WBC,  LACTICIDVEN Microbiology Recent Results (from the past 240 hour(s))  SARS Coronavirus 2 by RT PCR (hospital order, performed in [facility] hospital lab) Nasopharyngeal Nasopharyngeal Swab     Status: None   Collection Time: 02/03/20  9:25 PM   Specimen: Nasopharyngeal Swab  Result Value Ref Range Status   SARS Coronavirus 2 NEGATIVE NEGATIVE Final    Comment: (NOTE) SARS-CoV-2 target nucleic acids are NOT DETECTED.  The SARS-CoV-2 RNA is generally detectable in upper and lower respiratory specimens during the acute phase of infection. The lowest concentration of SARS-CoV-2 viral copies this assay can detect is 250 copies / mL. A negative result does not preclude SARS-CoV-2 infection and should not be used as the sole basis for treatment or other patient management decisions.  A negative  result may occur with improper specimen collection / handling, submission of specimen other than nasopharyngeal swab, presence of viral mutation(s) within the areas targeted by this assay, and inadequate number of viral copies (<250 copies / mL). A negative result must be combined with clinical observations, patient history, and epidemiological information.  Fact Sheet for Patients:   StrictlyIdeas.no  Fact Sheet for Healthcare Providers: BankingDealers.co.za  This test is not yet approved or  cleared by the [place] FDA and has been authorized for detection and/or diagnosis of SARS-CoV-2 by FDA under an Emergency Use Authorization (EUA).  This EUA will remain in effect (meaning this test can be used) for the duration of the COVID-19 declaration under Section 564(b)(1) of the Act, 21 U.S.C. section 360bbb-3(b)(1), unless the authorization is terminated or revoked sooner.  Performed at [facility], [place] [address], [place], [place] [address]   Culture, blood (routine x 2)     Status: None   Collection Time: 02/04/20  9:10 AM   Specimen: BLOOD LEFT WRIST  Result Value Ref Range Status   Specimen Description BLOOD LEFT WRIST  Final   Special Requests   Final    BOTTLES DRAWN AEROBIC AND ANAEROBIC Blood Culture adequate volume   Culture   Final    NO GROWTH 5 DAYS Performed at [facility], [place] [address], [place], [place] [address]    Report Status 02/09/2020  FINAL  Final  Culture, blood (routine x 2)     Status: None   Collection Time: 02/04/20  9:30 AM   Specimen: BLOOD RIGHT HAND  Result Value Ref Range Status   Specimen Description BLOOD RIGHT HAND  Final   Special Requests   Final    BOTTLES DRAWN AEROBIC AND ANAEROBIC Blood Culture results may not be optimal due to an inadequate volume of blood received in culture bottles   Culture   Final    NO GROWTH 5 DAYS Performed at [facility], [place]  [address], [place], [place] [address]    Report Status 02/09/2020 FINAL  Final  SARS Coronavirus 2 by RT PCR (hospital order, performed in [facility] hospital lab) Nasopharyngeal Nasopharyngeal Swab     Status: None   Collection Time: 02/09/20  3:20 PM   Specimen: Nasopharyngeal Swab  Result Value Ref Range Status   SARS Coronavirus 2 NEGATIVE NEGATIVE Final    Comment: (NOTE) SARS-CoV-2 target nucleic acids are NOT DETECTED.  The SARS-CoV-2 RNA is generally detectable in upper and lower respiratory specimens during the acute phase of infection. The lowest concentration of SARS-CoV-2 viral copies this assay can detect is 250 copies / mL. A negative result does not preclude SARS-CoV-2 infection and should not be used as the sole basis for treatment or other patient management decisions.  A negative result may occur with improper specimen collection / handling, submission of specimen other than nasopharyngeal swab, presence of viral mutation(s) within the areas targeted by this assay, and inadequate number of viral copies (<250 copies / mL). A negative result must be combined with clinical observations, patient history, and epidemiological information.  Fact Sheet for Patients:   StrictlyIdeas.no  Fact Sheet for Healthcare Providers: BankingDealers.co.za  This test is not yet approved or  cleared by the [place] FDA and has been authorized for detection and/or diagnosis of SARS-CoV-2 by FDA under an Emergency Use Authorization (EUA).  This EUA will remain in effect (meaning this test can be used) for the duration of the COVID-19 declaration under Section 564(b)(1) of the Act, 21 U.S.C. section 360bbb-3(b)(1), unless the authorization is terminated or revoked sooner.  Performed at [facility], [place] [address], [place], [place] [address]      Time coordinating discharge: Over 30 minutes  SIGNED:   Darliss Cheney, MD  [facility] 02/13/2020, 9:51 AM  If 7PM-7AM, please contact night-coverage [email or web address]

## 2020-02-13 NOTE — Progress Notes (Signed)
Hydrologist [facility])  Hospital Liaison: RN note         Notified by [facility] manager of patient/family request for [facility] Palliative services at home after discharge.         Writer spoke with patient to confirm interest and explain services.               [facility] team will follow up with patient after discharge.         Please call with any hospice or palliative related questions.         Thank you for this referral.         Farrel Gordon, RN, CCM  [place] (listed on [place] under Hospice[facility])    [phone]

## 2020-02-14 ENCOUNTER — Telehealth: Payer: Self-pay

## 2020-02-14 ENCOUNTER — Other Ambulatory Visit: Payer: Self-pay | Admitting: Cardiology

## 2020-02-14 ENCOUNTER — Other Ambulatory Visit (HOSPITAL_COMMUNITY): Payer: Medicare Other

## 2020-02-14 ENCOUNTER — Other Ambulatory Visit: Payer: Self-pay

## 2020-02-14 NOTE — Telephone Encounter (Signed)
Joshua Manns, NP-[facility]  Patient does not have a PCP, so she called to inform you, since you are his cardiologist.  Patient had a PAD screening and right leg shows severe.

## 2020-02-14 NOTE — Telephone Encounter (Signed)
I do not understand the message you sent me. "Pharmacist mention the lidocaine they send a high dose".

## 2020-02-14 NOTE — Telephone Encounter (Signed)
Pt called ask about a medication that was given to him at the hospital, but could not get it at the pharmacy. Called the pharmacy and asked which medication was the pt not able to get. Pharmacist mention the lidocaine they send a high dose. But I do not see that in his medication list. Please advise. Thank you

## 2020-02-15 NOTE — Telephone Encounter (Signed)
The pharmacy mention they only cary 100mg  not the 160mg  but I don't see that in his medlist

## 2020-02-17 ENCOUNTER — Emergency Department (HOSPITAL_COMMUNITY): Payer: Medicare Other

## 2020-02-17 ENCOUNTER — Inpatient Hospital Stay (HOSPITAL_COMMUNITY)
Admission: EM | Admit: 2020-02-17 | Discharge: 2020-02-19 | DRG: 308 | Disposition: A | Discharge status: Home / Self Care | Payer: Medicare Other | Attending: Family Medicine | Admitting: Family Medicine

## 2020-02-17 ENCOUNTER — Encounter (HOSPITAL_COMMUNITY): Payer: Self-pay

## 2020-02-17 ENCOUNTER — Other Ambulatory Visit: Payer: Self-pay

## 2020-02-17 DIAGNOSIS — K7290 Hepatic failure, unspecified without coma: Secondary | ICD-10-CM | POA: Diagnosis present

## 2020-02-17 DIAGNOSIS — I43 Cardiomyopathy in diseases classified elsewhere: Secondary | ICD-10-CM | POA: Diagnosis present

## 2020-02-17 DIAGNOSIS — Z720 Tobacco use: Secondary | ICD-10-CM | POA: Diagnosis present

## 2020-02-17 DIAGNOSIS — I132 Hypertensive heart and chronic kidney disease with heart failure and with stage 5 chronic kidney disease, or end stage renal disease: Secondary | ICD-10-CM | POA: Diagnosis present

## 2020-02-17 DIAGNOSIS — J449 Chronic obstructive pulmonary disease, unspecified: Secondary | ICD-10-CM | POA: Diagnosis present

## 2020-02-17 DIAGNOSIS — F17210 Nicotine dependence, cigarettes, uncomplicated: Secondary | ICD-10-CM | POA: Diagnosis present

## 2020-02-17 DIAGNOSIS — J9601 Acute respiratory failure with hypoxia: Secondary | ICD-10-CM | POA: Diagnosis present

## 2020-02-17 DIAGNOSIS — I2510 Atherosclerotic heart disease of native coronary artery without angina pectoris: Secondary | ICD-10-CM | POA: Diagnosis present

## 2020-02-17 DIAGNOSIS — B182 Chronic viral hepatitis C: Secondary | ICD-10-CM | POA: Diagnosis present

## 2020-02-17 DIAGNOSIS — K219 Gastro-esophageal reflux disease without esophagitis: Secondary | ICD-10-CM | POA: Diagnosis present

## 2020-02-17 DIAGNOSIS — I700 Atherosclerosis of aorta: Secondary | ICD-10-CM | POA: Diagnosis present

## 2020-02-17 DIAGNOSIS — Z8249 Family history of ischemic heart disease and other diseases of the circulatory system: Secondary | ICD-10-CM

## 2020-02-17 DIAGNOSIS — N2581 Secondary hyperparathyroidism of renal origin: Secondary | ICD-10-CM | POA: Diagnosis present

## 2020-02-17 DIAGNOSIS — I5033 Acute on chronic diastolic (congestive) heart failure: Secondary | ICD-10-CM | POA: Diagnosis present

## 2020-02-17 DIAGNOSIS — E875 Hyperkalemia: Secondary | ICD-10-CM | POA: Diagnosis present

## 2020-02-17 DIAGNOSIS — Z20822 Contact with and (suspected) exposure to covid-19: Secondary | ICD-10-CM | POA: Diagnosis present

## 2020-02-17 DIAGNOSIS — R188 Other ascites: Secondary | ICD-10-CM | POA: Diagnosis present

## 2020-02-17 DIAGNOSIS — G894 Chronic pain syndrome: Secondary | ICD-10-CM | POA: Diagnosis present

## 2020-02-17 DIAGNOSIS — D631 Anemia in chronic kidney disease: Secondary | ICD-10-CM | POA: Diagnosis present

## 2020-02-17 DIAGNOSIS — I248 Other forms of acute ischemic heart disease: Secondary | ICD-10-CM | POA: Diagnosis present

## 2020-02-17 DIAGNOSIS — Z5320 Procedure and treatment not carried out because of patient's decision for unspecified reasons: Secondary | ICD-10-CM | POA: Diagnosis not present

## 2020-02-17 DIAGNOSIS — I252 Old myocardial infarction: Secondary | ICD-10-CM

## 2020-02-17 DIAGNOSIS — Z9111 Patient's noncompliance with dietary regimen: Secondary | ICD-10-CM

## 2020-02-17 DIAGNOSIS — Z992 Dependence on renal dialysis: Secondary | ICD-10-CM

## 2020-02-17 DIAGNOSIS — N186 End stage renal disease: Secondary | ICD-10-CM | POA: Diagnosis present

## 2020-02-17 DIAGNOSIS — E785 Hyperlipidemia, unspecified: Secondary | ICD-10-CM | POA: Diagnosis present

## 2020-02-17 DIAGNOSIS — D6859 Other primary thrombophilia: Secondary | ICD-10-CM | POA: Diagnosis present

## 2020-02-17 DIAGNOSIS — Z87442 Personal history of urinary calculi: Secondary | ICD-10-CM

## 2020-02-17 DIAGNOSIS — Z9114 Patient's other noncompliance with medication regimen: Secondary | ICD-10-CM

## 2020-02-17 DIAGNOSIS — I4821 Permanent atrial fibrillation: Principal | ICD-10-CM | POA: Diagnosis present

## 2020-02-17 DIAGNOSIS — N189 Chronic kidney disease, unspecified: Secondary | ICD-10-CM | POA: Diagnosis present

## 2020-02-17 DIAGNOSIS — K7460 Unspecified cirrhosis of liver: Secondary | ICD-10-CM | POA: Diagnosis present

## 2020-02-17 DIAGNOSIS — Z955 Presence of coronary angioplasty implant and graft: Secondary | ICD-10-CM

## 2020-02-17 DIAGNOSIS — Z886 Allergy status to analgesic agent status: Secondary | ICD-10-CM

## 2020-02-17 DIAGNOSIS — Z885 Allergy status to narcotic agent status: Secondary | ICD-10-CM

## 2020-02-17 DIAGNOSIS — I4891 Unspecified atrial fibrillation: Secondary | ICD-10-CM | POA: Diagnosis not present

## 2020-02-17 DIAGNOSIS — I10 Essential (primary) hypertension: Secondary | ICD-10-CM | POA: Diagnosis present

## 2020-02-17 DIAGNOSIS — I480 Paroxysmal atrial fibrillation: Secondary | ICD-10-CM

## 2020-02-17 DIAGNOSIS — Z9119 Patient's noncompliance with other medical treatment and regimen: Secondary | ICD-10-CM

## 2020-02-17 DIAGNOSIS — Z8701 Personal history of pneumonia (recurrent): Secondary | ICD-10-CM

## 2020-02-17 DIAGNOSIS — B181 Chronic viral hepatitis B without delta-agent: Secondary | ICD-10-CM | POA: Diagnosis present

## 2020-02-17 DIAGNOSIS — M19012 Primary osteoarthritis, left shoulder: Secondary | ICD-10-CM | POA: Diagnosis present

## 2020-02-17 DIAGNOSIS — Z888 Allergy status to other drugs, medicaments and biological substances status: Secondary | ICD-10-CM

## 2020-02-17 DIAGNOSIS — D6869 Other thrombophilia: Secondary | ICD-10-CM | POA: Diagnosis present

## 2020-02-17 DIAGNOSIS — J810 Acute pulmonary edema: Secondary | ICD-10-CM

## 2020-02-17 LAB — CBC WITH DIFFERENTIAL/PLATELET
Abs Immature Granulocytes: 0.05 10*3/uL (ref 0.00–0.07)
Basophils Absolute: 0.1 10*3/uL (ref 0.0–0.1)
Basophils Relative: 1 %
Eosinophils Absolute: 0.2 10*3/uL (ref 0.0–0.5)
Eosinophils Relative: 2 %
HCT: 28.8 % — ABNORMAL LOW (ref 39.0–52.0)
Hemoglobin: 9.6 g/dL — ABNORMAL LOW (ref 13.0–17.0)
Immature Granulocytes: 0 %
Lymphocytes Relative: 9 %
Lymphs Abs: 1.1 10*3/uL (ref 0.7–4.0)
MCH: 26.7 pg (ref 26.0–34.0)
MCHC: 33.3 g/dL (ref 30.0–36.0)
MCV: 80.2 fL (ref 80.0–100.0)
Monocytes Absolute: 1 10*3/uL (ref 0.1–1.0)
Monocytes Relative: 8 %
Neutro Abs: 9.6 10*3/uL — ABNORMAL HIGH (ref 1.7–7.7)
Neutrophils Relative %: 80 %
Platelets: 160 10*3/uL (ref 150–400)
RBC: 3.59 MIL/uL — ABNORMAL LOW (ref 4.22–5.81)
RDW: 15.9 % — ABNORMAL HIGH (ref 11.5–15.5)
WBC: 12.1 10*3/uL — ABNORMAL HIGH (ref 4.0–10.5)
nRBC: 0 % (ref 0.0–0.2)

## 2020-02-17 LAB — COMPREHENSIVE METABOLIC PANEL
ALT: 34 U/L (ref 0–44)
AST: 26 U/L (ref 15–41)
Albumin: 2.4 g/dL — ABNORMAL LOW (ref 3.5–5.0)
Alkaline Phosphatase: 272 U/L — ABNORMAL HIGH (ref 38–126)
Anion gap: 15 (ref 5–15)
BUN: 40 mg/dL — ABNORMAL HIGH (ref 6–20)
CO2: 28 mmol/L (ref 22–32)
Calcium: 9.6 mg/dL (ref 8.9–10.3)
Chloride: 92 mmol/L — ABNORMAL LOW (ref 98–111)
Creatinine, Ser: 5.89 mg/dL — ABNORMAL HIGH (ref 0.61–1.24)
GFR calc Af Amer: 11 mL/min — ABNORMAL LOW (ref >60)
GFR calc non Af Amer: 10 mL/min — ABNORMAL LOW (ref >60)
Glucose, Bld: 105 mg/dL — ABNORMAL HIGH (ref 70–99)
Potassium: 6.7 mmol/L (ref 3.5–5.1)
Sodium: 135 mmol/L (ref 135–145)
Total Bilirubin: 1.2 mg/dL (ref 0.3–1.2)
Total Protein: 6.3 g/dL — ABNORMAL LOW (ref 6.5–8.1)

## 2020-02-17 LAB — TROPONIN I (HIGH SENSITIVITY)
Troponin I (High Sensitivity): 45 ng/L — ABNORMAL HIGH (ref <18)
Troponin I (High Sensitivity): 39 ng/L — ABNORMAL HIGH (ref <18)

## 2020-02-17 LAB — CBG MONITORING, ED
Glucose-Capillary: 82 mg/dL (ref 70–99)
Glucose-Capillary: 84 mg/dL (ref 70–99)

## 2020-02-17 LAB — DIGOXIN LEVEL: Digoxin Level: 0.3 ng/mL — ABNORMAL LOW (ref 0.8–2.0)

## 2020-02-17 LAB — MAGNESIUM: Magnesium: 2.2 mg/dL (ref 1.7–2.4)

## 2020-02-17 LAB — PHOSPHORUS: Phosphorus: 4.1 mg/dL (ref 2.5–4.6)

## 2020-02-17 LAB — SARS CORONAVIRUS 2 BY RT PCR (HOSPITAL ORDER, PERFORMED IN ~~LOC~~ HOSPITAL LAB): SARS Coronavirus 2: NEGATIVE

## 2020-02-17 LAB — BRAIN NATRIURETIC PEPTIDE: B Natriuretic Peptide: 1372.3 pg/mL — ABNORMAL HIGH (ref 0.0–100.0)

## 2020-02-17 MED ORDER — SODIUM ZIRCONIUM CYCLOSILICATE 5 G PO PACK
5.0000 g | PACK | Freq: Every day | ORAL | Status: DC
  Filled 2020-02-17: qty 1

## 2020-02-17 MED ORDER — CALCITRIOL 0.5 MCG PO CAPS
0.7500 ug | ORAL_CAPSULE | ORAL | Status: DC
  Administered 2020-02-17 – 2020-02-18 (×2): 0.75 ug via ORAL
  Filled 2020-02-17 (×3): qty 1

## 2020-02-17 MED ORDER — CHLORHEXIDINE GLUCONATE CLOTH 2 % EX PADS: 6.0000 | MEDICATED_PAD | Freq: Every day | CUTANEOUS | Status: DC

## 2020-02-17 MED ORDER — LOPERAMIDE HCL 2 MG PO CAPS
2.0000 mg | ORAL_CAPSULE | ORAL | Status: DC | PRN
  Administered 2020-02-17 – 2020-02-18 (×2): 2 mg via ORAL
  Filled 2020-02-17 (×3): qty 1

## 2020-02-17 MED ORDER — PANTOPRAZOLE SODIUM 40 MG PO TBEC
40.0000 mg | DELAYED_RELEASE_TABLET | Freq: Two times a day (BID) | ORAL | Status: DC
  Administered 2020-02-18 – 2020-02-19 (×4): 40 mg via ORAL
  Filled 2020-02-17 (×4): qty 1

## 2020-02-17 MED ORDER — DIGOXIN 125 MCG PO TABS
62.5000 ug | ORAL_TABLET | ORAL | Status: DC
  Administered 2020-02-18: 62.5 ug via ORAL
  Filled 2020-02-17: qty 1

## 2020-02-17 MED ORDER — SEVELAMER CARBONATE 800 MG PO TABS: 2400.0000 mg | ORAL_TABLET | Freq: Three times a day (TID) | ORAL | Status: DC

## 2020-02-17 MED ORDER — PROSOURCE PLUS PO LIQD
30.0000 mL | Freq: Two times a day (BID) | ORAL | Status: DC
  Administered 2020-02-17: 30 mL via ORAL
  Filled 2020-02-17 (×5): qty 30

## 2020-02-17 MED ORDER — DILTIAZEM HCL-DEXTROSE 125-5 MG/125ML-% IV SOLN (PREMIX): 5.0000 mg/h | INTRAVENOUS | Status: DC

## 2020-02-17 MED ORDER — OXYCODONE-ACETAMINOPHEN 5-325 MG PO TABS
1.0000 | ORAL_TABLET | ORAL | Status: DC | PRN
  Administered 2020-02-18 – 2020-02-19 (×2): 1 via ORAL
  Filled 2020-02-17 (×2): qty 1

## 2020-02-17 MED ORDER — RENA-VITE PO TABS
1.0000 | ORAL_TABLET | Freq: Every day | ORAL | Status: DC
  Administered 2020-02-18: 1 via ORAL
  Filled 2020-02-17 (×2): qty 1

## 2020-02-17 MED ORDER — DARBEPOETIN ALFA 40 MCG/0.4ML IJ SOSY
40.0000 ug | PREFILLED_SYRINGE | INTRAMUSCULAR | Status: DC
  Filled 2020-02-17: qty 0.4

## 2020-02-17 MED ORDER — DEXTROSE 50 % IV SOLN
1.0000 | Freq: Once | INTRAVENOUS | Status: AC
  Administered 2020-02-17: 50 mL via INTRAVENOUS
  Filled 2020-02-17: qty 50

## 2020-02-17 MED ORDER — CALCIUM GLUCONATE 10 % IV SOLN: 1.0000 g | Freq: Once | INTRAVENOUS | Status: DC

## 2020-02-17 MED ORDER — ONDANSETRON HCL 4 MG/2ML IJ SOLN
4.0000 mg | Freq: Once | INTRAMUSCULAR | Status: AC
  Administered 2020-02-17: 4 mg via INTRAVENOUS
  Filled 2020-02-17: qty 2

## 2020-02-17 MED ORDER — INSULIN ASPART 100 UNIT/ML IV SOLN
5.0000 [IU] | Freq: Once | INTRAVENOUS | Status: AC
  Administered 2020-02-17: 5 [IU] via INTRAVENOUS

## 2020-02-17 MED ORDER — TENOFOVIR DISOPROXIL FUMARATE 300 MG PO TABS
300.0000 mg | ORAL_TABLET | ORAL | Status: DC
  Administered 2020-02-18: 300 mg via ORAL
  Filled 2020-02-17: qty 1

## 2020-02-17 MED ORDER — SODIUM ZIRCONIUM CYCLOSILICATE 10 G PO PACK
10.0000 g | PACK | Freq: Every day | ORAL | Status: DC
  Filled 2020-02-17: qty 1

## 2020-02-17 MED ORDER — SEVELAMER CARBONATE 800 MG PO TABS
3200.0000 mg | ORAL_TABLET | Freq: Three times a day (TID) | ORAL | Status: DC
  Administered 2020-02-17 – 2020-02-19 (×5): 3200 mg via ORAL
  Filled 2020-02-17 (×6): qty 4

## 2020-02-17 MED ORDER — GABAPENTIN 100 MG PO CAPS
100.0000 mg | ORAL_CAPSULE | Freq: Three times a day (TID) | ORAL | Status: DC
  Administered 2020-02-18 – 2020-02-19 (×5): 100 mg via ORAL
  Filled 2020-02-17 (×5): qty 1

## 2020-02-17 MED ORDER — DILTIAZEM HCL-DEXTROSE 125-5 MG/125ML-% IV SOLN (PREMIX)
5.0000 mg/h | INTRAVENOUS | Status: AC
  Administered 2020-02-17: 5 mg/h via INTRAVENOUS
  Filled 2020-02-17 (×2): qty 125

## 2020-02-17 MED ORDER — ALBUTEROL SULFATE (2.5 MG/3ML) 0.083% IN NEBU
2.5000 mg | INHALATION_SOLUTION | RESPIRATORY_TRACT | Status: DC | PRN
  Filled 2020-02-17: qty 3

## 2020-02-17 MED ORDER — ALBUTEROL SULFATE HFA 108 (90 BASE) MCG/ACT IN AERS: 2.0000 | INHALATION_SPRAY | RESPIRATORY_TRACT | Status: DC | PRN

## 2020-02-17 MED ORDER — ACETAMINOPHEN 325 MG PO TABS
650.0000 mg | ORAL_TABLET | ORAL | Status: DC | PRN
  Filled 2020-02-17: qty 2

## 2020-02-17 MED ORDER — CALCIUM GLUCONATE-NACL 1-0.675 GM/50ML-% IV SOLN
1.0000 g | Freq: Once | INTRAVENOUS | Status: AC
  Administered 2020-02-17: 1000 mg via INTRAVENOUS
  Filled 2020-02-17: qty 50

## 2020-02-17 MED ORDER — DILTIAZEM LOAD VIA INFUSION
10.0000 mg | Freq: Once | INTRAVENOUS | Status: AC
  Administered 2020-02-17: 10 mg via INTRAVENOUS
  Filled 2020-02-17: qty 10

## 2020-02-17 NOTE — Progress Notes (Signed)
ON-CALL TEMPLATE 02/17/20  Patient's name: [name].   MRN: [id number].    DOB: 1960/11/29 Primary care provider: Patient, No Pcp Per. Cardiologist: Dr. Adrian Prows  Interaction regarding this patient's care today: Dr. Laverta Baltimore, ER physician called to discuss the case regarding [name]. Mr. [name] is a 59 year old African-American gentleman with a complex past medical history, medication/treatment noncompliance, and multiple hospitalizations over the last month.  Patient is readmitted for shortness of breath and respiratory distress.  Found to be in A. fib with RVR.   Patient was started on diltiazem drip and cardiology was called with further recommendations. Patient responded to the IV diltiazem drip well and during the phone call his resting heart rate was 70 bpm. May use IV Lopressor for rate control if needed as long as systolic blood pressure are within acceptable range. Plan is to dialyze the patient later today. Patient is stable hemodynamically per ER physician. Formal consult forthcoming, 02/18/2020.  Rex Kras, [place], [facility]  Pager: [phone] Office: [phone]

## 2020-02-17 NOTE — Progress Notes (Addendum)
[facility] Progress Note   Background:  [name] is a 59 Y/O male with ESRD on hemodialysis MWF at [facility]. PMH: Hepatitis B, Hepatitis C with cirrhosis, recurrent ascites, HTN, AFIB RVR, COPD, medical noncompliance. Was recently admitted 07/31-08/10/2019 (6th admission in 30 days) with AFib RVR, hyperkalemia, SOB. He presents to ED today with same. Apparently he called EMS twice this AM, refused to go with them to hospital 1st trip, 2nd trip refused to be assisted into ambulance and collapsed with HR reported in WCT rate in 170s. He has since been started on cardizem gtt, HR now less than 100 AFIB BBB. K+ 6.7. Giving Lokelma 10 Grams PO now, calcium gluconate 1 gram IV, insulin 5 units IV and amp D50W. He usually gets weekly paracentesis but missed this week. Currently he is stable in ED awaiting emergent HD for hyperkalemia. He states onset of SOB started yesterday and gradually worsened. He denies illicit substance abuse, eating food high in potassium. Last HD 02/15/2020 ran 3:20 hours of 4 hour treatment. Left 5.5 above OP EDW. Hasn't been to OP EDW in past 5 treatments.   Subjective: No C/O SOB at present, asking to use BSC.   Objective Vitals:   02/17/20 1153 02/17/20 1200 02/17/20 1215 02/17/20 1300  BP:  127/64 114/64 (!) 131/98  Resp: 13 (!) 26 (!) 27 (!) 25   Physical Exam General: Chronically ill appearing male in NAD Heart: irregular, irregular. AFIB with BBB. Rate currently 94-100s.  Lungs: Decreased in bases, otherwise CTAB. No WOB.  Abdomen: Distended, ascites present. Active BS.  Extremities: No LE edema.  Dialysis Access: R AVG + bruit   Additional Objective Labs: Basic Metabolic Panel: Recent Labs  Lab 02/11/20 0239 02/11/20 1123 02/12/20 0431 02/13/20 0424 02/17/20 1208  NA   < >  --  134* 134* 135  K   < >  --  3.6 4.2 6.7*  CL   < >  --  94* 90* 92*  CO2   < >  --  29 28 28   GLUCOSE   < >  --  123* 134* 105*  BUN    < >  --  18 26* 40*  CREATININE   < >  --  3.08* 5.02* 5.89*  CALCIUM   < >  --  8.9 9.5 9.6  PHOS  --  6.5*  --   --  4.1   < > = values in this interval not displayed.   Liver Function Tests: Recent Labs  Lab 02/12/20 0431 02/13/20 0424 02/17/20 1208  AST 74* 47* 26  ALT 103* 82* 34  ALKPHOS 222* 239* 272*  BILITOT 1.8* 1.3* 1.2  PROT 6.1* 6.3* 6.3*  ALBUMIN 2.5* 2.5* 2.4*   No results for input(s): LIPASE, AMYLASE in the last 168 hours. CBC: Recent Labs  Lab 02/11/20 0239 02/11/20 0239 02/12/20 0431 02/13/20 0424 02/17/20 1208  WBC 11.8*   < > 10.1 13.0* 12.1*  NEUTROABS 8.9*   < > 7.4 9.9* 9.6*  HGB 9.2*   < > 9.7* 10.0* 9.6*  HCT 26.5*   < > 27.9* 29.0* 28.8*  MCV 79.3*  --  81.3 80.8 80.2  PLT 130*   < > 136* 135* 160   < > = values in this interval not displayed.   Blood Culture    Component Value Date/Time   SDES BLOOD RIGHT HAND 02/04/2020 0930   SPECREQUEST  02/04/2020 0930    BOTTLES DRAWN  AEROBIC AND ANAEROBIC Blood Culture results may not be optimal due to an inadequate volume of blood received in culture bottles   CULT  02/04/2020 0930    NO GROWTH 5 DAYS Performed at [facility] [address], [place],  [address]    REPTSTATUS 02/09/2020 FINAL 02/04/2020 0930    Cardiac Enzymes: No results for input(s): CKTOTAL, CKMB, CKMBINDEX, TROPONINI in the last 168 hours. CBG: No results for input(s): GLUCAP in the last 168 hours. Iron Studies: No results for input(s): IRON, TIBC, TRANSFERRIN, FERRITIN in the last 72 hours. @lablastinr3 @ Studies/Results: DG Chest Portable 1 View  Result Date: 02/17/2020 CLINICAL DATA:  Shortness of breath EXAM: PORTABLE CHEST 1 VIEW COMPARISON:  02/09/2020 FINDINGS: Stable cardiomegaly. Atherosclerotic calcification of the aorta and arch vessels. Mild pulmonary vascular congestion with mildly prominent interstitial markings bilaterally. No focal consolidation. No pleural effusion or pneumothorax.  IMPRESSION: Findings suggestive of CHF with mild interstitial edema. Electronically Signed   By: Davina Poke D.O.   On: 02/17/2020 13:10   Medications: . diltiazem (CARDIZEM) infusion 5 mg/hr (02/17/20 1326)   . calcium gluconate  1 g Intravenous Once  . [START ON 02/18/2020] sodium zirconium cyclosilicate  10 g Oral Daily     HD orders: [facility] MWF 4 hrs 180NRe 450/800 61 kg 2.0 K/2.0 Ca -No heparin -Mircera 100 mcg IV q 2 weeks (last 01/09/2020 recently restarted hasn't rec'd dose yet).  -Venofer 50 mg IV weekly (recently restarted, hasn't rec'd dose yet).  -Calcitriol 0.75 mcg PO TIW  Assessment/Plan: 1. Hyperkalemia-urgent HD this evening. Has rec'd Lokelma, insulin, Ca+ gluc, D50W. Hyperkalemia has become a chronic issue, may need tiw Lokelma or kayexalate at home.  2. Afib RVR-reports compliance with home meds. Currently on Cardizem gtt rate 90-100s. Not a candidate for anticoagulation. Per primary. Looks like he has a chronic LBBB as well perhaps looking back.  3. Cirrhosis-missed paracentesis last week. Needs this done ASAP. Discussed with ED MD. Per primary 4. ESRD -MWF. Short treatment tomorrow if he will come to get back on schedule.  5. Anemia - HGB 9.6. Give Aranesp 40 mcg IV with HD tomorrow. Follow HGB. Transfuse as needed.  6. Secondary hyperparathyroidism - Last OP labs 02/13/2020 Ca/PO4 at goal. PTH elevated. Continue binders, VDRA.  7. HTN/volume -  8. Nutrition - Chronically low albumin. Renal diet with protein supps.renal vits.   Rita H. Brown NP-C 02/17/2020, 2:11 PM  [facility] [phone]  Pt seen, examined and agree w assess/plan as above with additions as indicated.  [facility] 02/17/2020, 3:30 PM

## 2020-02-17 NOTE — ED Notes (Signed)
Pt is refusing to have CBG, refuses anything to drink - Nepro ordered from 35M.

## 2020-02-17 NOTE — ED Provider Notes (Signed)
Emergency Department Provider Note   I have reviewed the triage vital signs and the nursing notes.   HISTORY  Chief Complaint Respiratory Distress, Atrial Fibrillation, and Dialysis   HPI [name] is a 59 y.o. male with complicated past medical history including end-stage renal disease (MWF) with last HD yesterday, COPD, hepatitis, a-fib on Digoxin, and HLD presents to the emergency department with shortness of breath worsening since yesterday.  He called EMS this morning but ultimately sent them away.  He called them back several minutes later.  EMS report that he was seeming short of breath but lowest O2 sat was in the low 90s for them on scene.  He was placed on oxygen and work of breathing improved.  He was found to be in A. fib with RVR.  EMS report that he insisted on walking to the truck and while on monitor had a wider complex tachycardia which converted back to his A. fib type rhythm after coughing.   Patient denies any chest pain, fevers, chills.  He tells me that he has been compliant with his medications including digoxin.  His diltiazem dose was decreased during his last admission when digoxin was started.  His cardiologist is Dr. Einar Gip but is being referred to [facility] with an appointment scheduled in December.   There is discussion in the prior discharge summary regarding palliative care consult and the patient's decision to become DNR.  I asked him about this and he states "change all that" and reports that he would want full intervention despite his increasing medical complexity and significantly decreased chance of meaningful outcome if he were to require CPR or intubation.   CODE STATUS: FULL CODE   Past Medical History:  Diagnosis Date  . Anemia   . Anxiety   . Arthritis    left shoulder  . Atherosclerosis of aorta ([place])   . Cardiomegaly   . Chest pain    DATE UNKNOWN, C/O PERIODICALLY  . Cocaine abuse ([place])   . COPD exacerbation ([place]) 08/17/2016  .  Coronary artery disease    stent 02/22/17  . ESRD (end stage renal disease) on dialysis ([place])    "[name]; MWF" (07/04/2017)  . GERD (gastroesophageal reflux disease)    DATE UNKNOWN  . Hemorrhoids   . Hepatitis B, chronic ([place])   . Hepatitis C   . History of kidney stones   . Hyperkalemia   . Hypertension   . Kidney failure   . Metabolic bone disease    Patient denies  . Mitral stenosis   . Myocardial infarction ([place])   . Pneumonia   . Pulmonary edema   . Solitary rectal ulcer syndrome 07/2017   at flex sig for rectal bleeding  . Tubular adenoma of colon     Patient Active Problem List   Diagnosis Date Noted  . DNR (do not resuscitate)   . CAP (community acquired pneumonia) 02/09/2020  . Leukocytosis 01/19/2020  . Tobacco use 01/19/2020  . Acute pulmonary edema ([place]) 12/21/2019  . Acetaminophen overdose 10/27/2019  . ESRD (end stage renal disease) ([place])   . Goals of care, counseling/discussion   . Hypoxia 10/04/2019  . Advanced care planning/counseling discussion   . Renal failure 09/26/2019  . Dyspnea 06/09/2019  . Acquired thrombophilia ([place]) 06/05/2019  . A-fib ([place]) 05/30/2019  . Atrial fibrillation with RVR ([place]) 05/29/2019  . Melena   . Pressure injury of skin 03/09/2019  . Abdominal distention   . Volume overload 12/28/2018  .  Sepsis ([place]) 09/12/2018  . Coronary artery disease involving native coronary artery of native heart without angina pectoris 03/11/2018  . Benign neoplasm of cecum   . Benign neoplasm of ascending colon   . Benign neoplasm of descending colon   . Benign neoplasm of rectum   . AF (paroxysmal atrial fibrillation) ([place]) 01/23/2018  . Hx of colonic polyps 01/20/2018  . End-stage renal disease on hemodialysis ([place]) 11/21/2017  . GERD (gastroesophageal reflux disease) 11/16/2017  . Decompensated hepatic cirrhosis ([place]) 11/15/2017  . Palliative care by specialist   . Hyponatremia 11/04/2017  . SBP (spontaneous bacterial peritonitis)  ([place]) 10/30/2017  . Liver disease, chronic 10/30/2017  . SOB (shortness of breath)   . Abdominal pain 10/28/2017  . Upper airway cough syndrome with flattening on f/v loop 10/13/17 c/w vcd 10/17/2017  . Elevated diaphragm 10/13/2017  . Ileus ([place]) 09/29/2017  . QT prolongation 09/29/2017  . Malnutrition of moderate degree 09/29/2017  . Sinus congestion 09/03/2017  . Symptomatic anemia 09/02/2017  . Cirrhosis of liver with ascites ([place]) 09/02/2017  . Left bundle branch block 09/02/2017  . Mitral stenosis 09/02/2017  . Hematochezia 07/15/2017  . Wide-complex tachycardia ([place])   . Endotracheally intubated   . ESRD on dialysis ([place]) 07/04/2017  . CKD (chronic kidney disease) stage V requiring chronic dialysis ([place]) 06/18/2017  . History of Cocaine abuse ([place]) 06/18/2017  . Hypertension 06/18/2017  . Infection of AV graft for dialysis ([place]) 06/18/2017  . Anxiety 06/18/2017  . Anemia due to chronic kidney disease 06/18/2017  . Atypical atrial flutter ([place]) 06/18/2017  . Personality disorder ([place]) 06/13/2017  . Cellulitis 06/12/2017  . Adjustment disorder with mixed anxiety and depressed mood 06/10/2017  . Suicidal ideation 06/10/2017  . Arm wound, left, sequela 06/10/2017  . Dyspnea on exertion 05/29/2017  . Tachycardia 05/29/2017  . Hyperkalemia 05/22/2017  . Acute metabolic encephalopathy   . Anemia 04/23/2017  . Ascites 04/23/2017  . COPD (chronic obstructive pulmonary disease) ([place]) 04/23/2017  . Acute on chronic respiratory failure with hypoxia ([place]) 03/25/2017  . Arrhythmia 03/25/2017  . COPD GOLD 0 with flattening on inps f/v  09/27/2016  . Essential hypertension 09/27/2016  . Fluid overload 08/30/2016  . COPD exacerbation ([place]) 08/17/2016  . Hypertensive urgency 08/17/2016  . Problem with dialysis access ([place]) 07/23/2016  . Chronic hepatitis B ([place]) 03/05/2014  . Chronic hepatitis C without hepatic coma ([place]) 03/05/2014  . Internal hemorrhoids with bleeding, swelling  and itching 03/05/2014  . Thrombocytopenia ([place]) 03/05/2014  . Chest pain 02/27/2014  . Alcohol abuse 04/14/2009  . Nicotine dependence, cigarettes, uncomplicated 34/19/3790  . GANGLION CYST 04/14/2009    Past Surgical History:  Procedure Laterality Date  . A/V FISTULAGRAM Left 05/26/2017   Procedure: A/V FISTULAGRAM;  Surgeon: Conrad Muskogee, MD;  Location: [place] [facility];  Service: Cardiovascular;  Laterality: Left;  . A/V FISTULAGRAM Right 11/18/2017   Procedure: A/V FISTULAGRAM - Right Arm;  Surgeon: Elam Dutch, MD;  Location: [place] [facility];  Service: Cardiovascular;  Laterality: Right;  . APPLICATION OF WOUND VAC Left 06/14/2017   Procedure: APPLICATION OF WOUND VAC;  Surgeon: Katha Cabal, MD;  Location: [facility];  Service: Vascular;  Laterality: Left;  . AV FISTULA PLACEMENT  2012   BELIEVED WAS PLACED IN JUNE  . AV FISTULA PLACEMENT Right 08/09/2017   Procedure: Creation Right arm ARTERIOVENOUS BRACHIOCEPOHALIC FISTULA;  Surgeon: Elam Dutch, MD;  Location: [place];  Service: Vascular;  Laterality: Right;  .  AV FISTULA PLACEMENT Right 11/22/2017   Procedure: INSERTION OF ARTERIOVENOUS (AV) GORE-TEX GRAFT RIGHT UPPER ARM;  Surgeon: Elam Dutch, MD;  Location: [place];  Service: Vascular;  Laterality: Right;  . BIOPSY  01/25/2018   Procedure: BIOPSY;  Surgeon: Jerene Bears, MD;  Location: [place];  Service: Gastroenterology;;  . BIOPSY  04/10/2019   Procedure: BIOPSY;  Surgeon: Jerene Bears, MD;  Location: [facility];  Service: Gastroenterology;;  . COLONOSCOPY    . COLONOSCOPY WITH PROPOFOL N/A 01/25/2018   Procedure: COLONOSCOPY WITH PROPOFOL;  Surgeon: Jerene Bears, MD;  Location: [place];  Service: Gastroenterology;  Laterality: N/A;  . CORONARY STENT INTERVENTION N/A 02/22/2017   Procedure: CORONARY STENT INTERVENTION;  Surgeon: Nigel Mormon, MD;  Location: [place] [facility];  Service: Cardiovascular;  Laterality: N/A;  .  ESOPHAGOGASTRODUODENOSCOPY (EGD) WITH PROPOFOL N/A 01/25/2018   Procedure: ESOPHAGOGASTRODUODENOSCOPY (EGD) WITH PROPOFOL;  Surgeon: Jerene Bears, MD;  Location: [place];  Service: Gastroenterology;  Laterality: N/A;  . ESOPHAGOGASTRODUODENOSCOPY (EGD) WITH PROPOFOL N/A 04/10/2019   Procedure: ESOPHAGOGASTRODUODENOSCOPY (EGD) WITH PROPOFOL;  Surgeon: Jerene Bears, MD;  [facility];  Service: Gastroenterology;  Laterality: N/A;  . FLEXIBLE SIGMOIDOSCOPY N/A 07/15/2017   Procedure: FLEXIBLE SIGMOIDOSCOPY;  Surgeon: Carol Ada, MD;  Location: [place];  Service: Endoscopy;  Laterality: N/A;  . HEMORRHOID BANDING    . I & D EXTREMITY Left 06/01/2017   Procedure: IRRIGATION AND DEBRIDEMENT LEFT ARM HEMATOMA WITH LIGATION OF LEFT ARM AV FISTULA;  Surgeon: Elam Dutch, MD;  Location: [place];  Service: Vascular;  Laterality: Left;  . I & D EXTREMITY Left 06/14/2017   Procedure: IRRIGATION AND DEBRIDEMENT EXTREMITY;  Surgeon: Katha Cabal, MD;  Location: [facility];  Service: Vascular;  Laterality: Left;  . INSERTION OF DIALYSIS CATHETER  05/30/2017  . INSERTION OF DIALYSIS CATHETER N/A 05/30/2017   Procedure: INSERTION OF DIALYSIS CATHETER;  Surgeon: Elam Dutch, MD;  Location: [place];  Service: Vascular;  Laterality: N/A;  . IR PARACENTESIS  08/30/2017  . IR PARACENTESIS  09/29/2017  . IR PARACENTESIS  10/28/2017  . IR PARACENTESIS  11/09/2017  . IR PARACENTESIS  11/16/2017  . IR PARACENTESIS  11/28/2017  . IR PARACENTESIS  12/01/2017  . IR PARACENTESIS  12/06/2017  . IR PARACENTESIS  01/03/2018  . IR PARACENTESIS  01/23/2018  . IR PARACENTESIS  02/07/2018  . IR PARACENTESIS  02/21/2018  . IR PARACENTESIS  03/06/2018  . IR PARACENTESIS  03/17/2018  . IR PARACENTESIS  04/04/2018  . IR PARACENTESIS  12/28/2018  . IR PARACENTESIS  01/08/2019  . IR PARACENTESIS  01/23/2019  . IR PARACENTESIS  02/01/2019  . IR PARACENTESIS  02/19/2019  . IR PARACENTESIS  03/01/2019  . IR PARACENTESIS   03/15/2019  . IR PARACENTESIS  04/03/2019  . IR PARACENTESIS  04/12/2019  . IR PARACENTESIS  05/01/2019  . IR PARACENTESIS  05/08/2019  . IR PARACENTESIS  05/24/2019  . IR PARACENTESIS  06/12/2019  . IR PARACENTESIS  07/09/2019  . IR PARACENTESIS  07/27/2019  . IR PARACENTESIS  08/09/2019  . IR PARACENTESIS  08/21/2019  . IR PARACENTESIS  09/17/2019  . IR PARACENTESIS  10/05/2019  . IR PARACENTESIS  10/29/2019  . IR PARACENTESIS  11/08/2019  . IR PARACENTESIS  12/12/2019  . IR PARACENTESIS  01/03/2020  . IR PARACENTESIS  01/10/2020  . IR PARACENTESIS  01/17/2020  . IR PARACENTESIS  01/24/2020  . IR PARACENTESIS  01/31/2020  .  IR PARACENTESIS  02/07/2020  . IR RADIOLOGIST EVAL & MGMT  02/14/2018  . IR RADIOLOGIST EVAL & MGMT  02/22/2019  . LEFT HEART CATH AND CORONARY ANGIOGRAPHY N/A 02/22/2017   Procedure: LEFT HEART CATH AND CORONARY ANGIOGRAPHY;  Surgeon: Nigel Mormon, MD;  Location: [place] [facility];  Service: Cardiovascular;  Laterality: N/A;  . LIGATION OF ARTERIOVENOUS  FISTULA Left 07/15/4313   Procedure: Plication of Left Arm Arteriovenous Fistula;  Surgeon: Elam Dutch, MD;  [facility] [place];  Service: Vascular;  Laterality: Left;  . POLYPECTOMY    . POLYPECTOMY  01/25/2018   Procedure: POLYPECTOMY;  Surgeon: Jerene Bears, MD;  Location: [place];  Service: Gastroenterology;;  . REVISON OF ARTERIOVENOUS FISTULA Left 4/00/8676   Procedure: PLICATION OF DISTAL ANEURYSMAL SEGEMENT OF LEFT UPPER ARM ARTERIOVENOUS FISTULA;  Surgeon: Elam Dutch, MD;  Location: [place];  Service: Vascular;  Laterality: Left;  . REVISON OF ARTERIOVENOUS FISTULA Left 1/95/0932   Procedure: Plication of Left Upper Arm Fistula ;  Surgeon: Waynetta Sandy, MD;  Location: [place];  Service: Vascular;  Laterality: Left;  . SKIN GRAFT SPLIT THICKNESS LEG / FOOT Left    SKIN GRAFT SPLIT THICKNESS LEFT ARM DONOR SITE: LEFT ANTERIOR THIGH  . SKIN SPLIT GRAFT Left 07/04/2017   Procedure: SKIN GRAFT SPLIT  THICKNESS LEFT ARM DONOR SITE: LEFT ANTERIOR THIGH;  Surgeon: Elam Dutch, MD;  Location: [place];  Service: Vascular;  Laterality: Left;  . THROMBECTOMY W/ EMBOLECTOMY Left 06/05/2017   Procedure: EXPLORATION OF LEFT ARM FOR BLEEDING; OVERSEWED PROXIMAL FISTULA;  Surgeon: Angelia Mould, MD;  Location: [place];  Service: Vascular;  Laterality: Left;  . WOUND EXPLORATION Left 06/03/2017   Procedure: WOUND EXPLORATION WITH WOUND VAC APPLICATION TO LEFT ARM;  Surgeon: Angelia Mould, MD;  Location: [place];  Service: Vascular;  Laterality: Left;    Allergies Tramadol, Morphine and related, Pollen extract, Acetaminophen, Aspirin, and Clonidine derivatives  Family History  Problem Relation Age of Onset  . Heart disease Mother   . Lung cancer Mother   . Heart disease Father   . Malignant hyperthermia Father   . COPD Father   . Throat cancer Sister   . Esophageal cancer Sister   . Hypertension Other   . COPD Other   . Colon cancer Neg Hx   . Colon polyps Neg Hx   . Rectal cancer Neg Hx   . Stomach cancer Neg Hx     Social History Social History   Tobacco Use  . Smoking status: Current Every Day Smoker    Packs/day: 0.50    Years: 43.00    Pack years: 21.50    Types: Cigarettes    Start date: 08/13/1973  . Smokeless tobacco: Never Used  Vaping Use  . Vaping Use: Never used  Substance Use Topics  . Alcohol use: Not Currently    Comment: quit drinking in 2017  . Drug use: Not Currently    Types: Marijuana, Cocaine    Comment: reports using once every 3 months,  Quit 04-06-2019    Review of Systems  Constitutional: No fever/chills Eyes: No visual changes. ENT: No sore throat. Cardiovascular: Denies chest pain. Respiratory: Positive shortness of breath. Gastrointestinal: No abdominal pain.  No nausea, no vomiting.  No diarrhea.  No constipation. Positive abdominal distension.  Genitourinary: Negative for dysuria. Musculoskeletal: Negative for back  pain. Skin: Negative for rash. Neurological: Negative for headaches, focal weakness or numbness.  10-point  ROS otherwise negative.  ____________________________________________   PHYSICAL EXAM:  VITAL SIGNS: Vitals:   02/17/20 1215 02/17/20 1300  BP: 114/64 (!) 131/98  Resp: (!) 27 (!) 25   Vitals:   02/18/20 0155 02/18/20 0500  BP: 112/68 (!) 111/57  Pulse: 94 94  Resp: 19 18  Temp: 98.6 F (37 C) 98 F (36.7 C)  SpO2: 92% 98%     Constitutional: Alert and oriented. Increased WOB but talkative.  Eyes: Conjunctivae are normal. Head: Atraumatic. Nose: No congestion/rhinnorhea. Mouth/Throat: Mucous membranes are moist.   Neck: No stridor.   Cardiovascular: Tachycardia (irregular). Good peripheral circulation. Grossly normal heart sounds.   Respiratory: Normal respiratory effort.  No retractions. Lungs diminished at the bases bilaterally.  Gastrointestinal: Soft and nontender. Positive distention w/ fluid wave.  Musculoskeletal: No lower extremity tenderness nor edema. No gross deformities of extremities. Neurologic:  Normal speech and language. No gross focal neurologic deficits are appreciated.  Skin:  Skin is warm, dry and intact. No rash noted.  ____________________________________________   LABS (all labs ordered are listed, but only abnormal results are displayed)  Labs Reviewed  COMPREHENSIVE METABOLIC PANEL - Abnormal; Notable for the following components:      Result Value   Potassium 6.7 (*)    Chloride 92 (*)    Glucose, Bld 105 (*)    BUN 40 (*)    Creatinine, Ser 5.89 (*)    Total Protein 6.3 (*)    Albumin 2.4 (*)    Alkaline Phosphatase 272 (*)    GFR calc non Af Amer 10 (*)    GFR calc Af Amer 11 (*)    All other components within normal limits  BRAIN NATRIURETIC PEPTIDE - Abnormal; Notable for the following components:   B Natriuretic Peptide 1,372.3 (*)    All other components within normal limits  CBC WITH DIFFERENTIAL/PLATELET -  Abnormal; Notable for the following components:   WBC 12.1 (*)    RBC 3.59 (*)    Hemoglobin 9.6 (*)    HCT 28.8 (*)    RDW 15.9 (*)    Neutro Abs 9.6 (*)    All other components within normal limits  DIGOXIN LEVEL - Abnormal; Notable for the following components:   Digoxin Level 0.3 (*)    All other components within normal limits  TROPONIN I (HIGH SENSITIVITY) - Abnormal; Notable for the following components:   Troponin I (High Sensitivity) 45 (*)    All other components within normal limits  SARS CORONAVIRUS 2 BY RT PCR (HOSPITAL ORDER, [facility])  MAGNESIUM  PHOSPHORUS  CBG MONITORING, ED  TROPONIN I (HIGH SENSITIVITY)   ____________________________________________  EKG   EKG Interpretation  Date/Time:  Sunday February 17 2020 11:53:41 EDT Ventricular Rate:  124 PR Interval:    QRS Duration: 177 QT Interval:  389 QTC Calculation: 559 R Axis:   -93 Text Interpretation: A-fib with RVR Nonspecific IVCD with LAD Anterior infarct, acute (LAD) Lateral leads are also involved No STEMI Confirmed by Nanda Quinton [id number]) on 02/17/2020 12:03:06 PM       ____________________________________________  RADIOLOGY  DG Chest Portable 1 View  Result Date: 02/17/2020 CLINICAL DATA:  Shortness of breath EXAM: PORTABLE CHEST 1 VIEW COMPARISON:  02/09/2020 FINDINGS: Stable cardiomegaly. Atherosclerotic calcification of the aorta and arch vessels. Mild pulmonary vascular congestion with mildly prominent interstitial markings bilaterally. No focal consolidation. No pleural effusion or pneumothorax. IMPRESSION: Findings suggestive of CHF with mild interstitial edema. Electronically Signed  By: Davina Poke D.O.   On: 02/17/2020 13:10    ____________________________________________   PROCEDURES  Procedure(s) performed:   Procedures  CRITICAL CARE Performed by: Margette Fast Total critical care time: 45 minutes Critical care time was exclusive of  separately billable procedures and treating other patients. Critical care was necessary to treat or prevent imminent or life-threatening deterioration. Critical care was time spent personally by me on the following activities: development of treatment plan with patient and/or surrogate as well as nursing, discussions with consultants, evaluation of patient's response to treatment, examination of patient, obtaining history from patient or surrogate, ordering and performing treatments and interventions, ordering and review of laboratory studies, ordering and review of radiographic studies, pulse oximetry and re-evaluation of patient's condition.  Nanda Quinton, MD Emergency Medicine  ____________________________________________   INITIAL IMPRESSION / ASSESSMENT AND PLAN / ED COURSE  Pertinent labs & imaging results that were available during my care of the patient were reviewed by me and considered in my medical decision making (see chart for details).   Patient presents emergency department evaluation of shortness of breath since yesterday.  Found in A. fib with RVR.  Question run of nonsustained V. tach versus A. fib with aberrancy with EMS.  EKG today is similar to his most recent hospital admission.  Plan to restart diltiazem infusion for rate control.  Patient with good blood pressure here.  Plan to send off lab work including digoxin level along with chest x-ray and Covid testing.  In review of the prior discharge summary the patient is running out of options regarding his A. fib control.  He is tolerating the supplemental oxygen well and is not requiring BiPAP or intubation at this time.   01:57 PM  Spoke with Nephrology. He will go to HD later this afternoon. Will give Lokelma and Calcium for now with K elevated.   02:20 PM  Spoke with Dr. Terri Skains with [facility]. He is ok with Diltiazem for rate control. Could also give IV lopressor if needed but HR responding to Diltiazem infusion.  Would NOT increased Diltiazem dose for now. They will consult in the AM as rate is well controlled now.   Discussed patient's case with [facility] to request admission. Patient and family (if present) updated with plan. Care transferred to [facility] service.  I reviewed all nursing notes, vitals, pertinent old records, EKGs, labs, imaging (as available).  ____________________________________________  FINAL CLINICAL IMPRESSION(S) / ED DIAGNOSES  Final diagnoses:  Acute respiratory failure with hypoxia (HCC)  Acute pulmonary edema (HCC)  Hyperkalemia  Atrial fibrillation with RVR ([place])     MEDICATIONS GIVEN DURING THIS VISIT:  Medications  diltiazem (CARDIZEM) 1 mg/mL load via infusion 10 mg (10 mg Intravenous Bolus from Bag 02/17/20 1330)    And  diltiazem (CARDIZEM) 125 mg in dextrose 5% 125 mL (1 mg/mL) infusion (5 mg/hr Intravenous New Bag/Given 02/17/20 1326)  calcium gluconate inj 10% (1 g) URGENT USE ONLY! (has no administration in time range)  sodium zirconium cyclosilicate (LOKELMA) packet 10 g (has no administration in time range)  insulin aspart (novoLOG) injection 5 Units (has no administration in time range)  dextrose 50 % solution 50 mL (has no administration in time range)  ondansetron (ZOFRAN) injection 4 mg (4 mg Intravenous Given 02/17/20 1341)    Note:  This document was prepared using Dragon voice recognition software and may include unintentional dictation errors.  Nanda Quinton, MD, [facility] Emergency Medicine    Mackinze Criado, Wonda [facility], MD 02/18/20  0735  

## 2020-02-17 NOTE — ED Notes (Signed)
Pt refused CBG check at 17:20. Pt was given [place] sandwich and sprite to keep sugar level up.

## 2020-02-17 NOTE — H&P (Addendum)
History and Physical    [name] KFE:[id number] DOB: 05-04-1961 DOA: 02/17/2020  PCP: Patient, No Pcp Per  Patient coming from: home I have personally briefly reviewed patient's old medical records in [place]  Chief Complaint: Shortness of breath since 1 day  HPI: [name] is a 59 y.o. male with medical history significant of end-stage renal disease on hemodialysis (MWF), hypertension, hyperlipidemia, tobacco abuse, COPD, former alcohol abuse, paroxysmal A. fib, hepatitis B, C, cirrhosis, chronic pain syndrome, coronary artery disease, depression/anxiety, anemia of chronic disease, poor medication compliance presented to emergency department with shortness of breath since 1 day.  This is his 6th admission in the last 1 month with similar symptoms of shortness of breath and respiratory distress and was found to be in A. fib with RVR.  And was started on IV Cardizem drip in ED.  Patient has history of multiple comorbidities as well as noncompliance. On previous hospitalization-cardiology Dr. Einar Gip recommended to consider "referral for convergent procedure at [facility]".  Patient reports shortness of breath associated with orthopnea, not sure about the weight gain, denies leg swelling, palpitation, headache, blurry vision, chest pain, wheezing, cough, abdominal pain, vomiting, diarrhea, bowel changes.  He does not make urine.  He tells me that he has been compliant with his dialysis appointment.  He has history of cirrhosis with recurrent ascites and gets his paracentesis on Thursdays and missed his last appointment.  Reports that he continues to smoke cigarettes every day however denies alcohol or any illicit drug use.  He tells me that he has been compliant with his home medications.  Please note: On previous admission palliative care was consulted and his CODE STATUS was changed from full code to DNR.  I discussed with the patient however he wants to remain full code  during this hospitalization.  ED Course: Upon arrival to ED:  found to be in A. fib with RVR, afebrile, hypoxic-placed on 6 L of oxygen via nasal cannula, CBC shows WBC of 12.1, anemia of chronic disease, CMP shows potassium of 6.7, troponin: 45, BNP: 1372, magnesium, phosphorus: WNL, digoxin level: Low.  COVID-19 negative.  Chest x-ray shows: CHF with mild interstitial edema.  Patient started on IV Cardizem drip, received IV calcium gluconate, 5 units of IV insulin and amp of D50W, Lokelma.  EDP consulted cardiology and nephrology for emergency dialysis.  [facility] hospitalist consulted for admission for A. fib with RVR and fluid overload.  Review of Systems: As per HPI otherwise negative.    Past Medical History:  Diagnosis Date  . Anemia   . Anxiety   . Arthritis    left shoulder  . Atherosclerosis of aorta ([place])   . Cardiomegaly   . Chest pain    DATE UNKNOWN, C/O PERIODICALLY  . Cocaine abuse ([place])   . COPD exacerbation ([place]) 08/17/2016  . Coronary artery disease    stent 02/22/17  . ESRD (end stage renal disease) on dialysis ([place])    "[name]; MWF" (07/04/2017)  . GERD (gastroesophageal reflux disease)    DATE UNKNOWN  . Hemorrhoids   . Hepatitis B, chronic ([place])   . Hepatitis C   . History of kidney stones   . Hyperkalemia   . Hypertension   . Kidney failure   . Metabolic bone disease    Patient denies  . Mitral stenosis   . Myocardial infarction ([place])   . Pneumonia   . Pulmonary edema   . Solitary rectal ulcer syndrome 07/2017  at flex sig for rectal bleeding  . Tubular adenoma of colon     Past Surgical History:  Procedure Laterality Date  . A/V FISTULAGRAM Left 05/26/2017   Procedure: A/V FISTULAGRAM;  Surgeon: Conrad Meno, MD;  Location: [place] [facility];  Service: Cardiovascular;  Laterality: Left;  . A/V FISTULAGRAM Right 11/18/2017   Procedure: A/V FISTULAGRAM - Right Arm;  Surgeon: Elam Dutch, MD;  Location: [place] [facility];  Service:  Cardiovascular;  Laterality: Right;  . APPLICATION OF WOUND VAC Left 06/14/2017   Procedure: APPLICATION OF WOUND VAC;  Surgeon: Katha Cabal, MD;  Location: [facility];  Service: Vascular;  Laterality: Left;  . AV FISTULA PLACEMENT  2012   BELIEVED WAS PLACED IN JUNE  . AV FISTULA PLACEMENT Right 08/09/2017   Procedure: Creation Right arm ARTERIOVENOUS BRACHIOCEPOHALIC FISTULA;  Surgeon: Elam Dutch, MD;  Location: [facility];  Service: Vascular;  Laterality: Right;  . AV FISTULA PLACEMENT Right 11/22/2017   Procedure: INSERTION OF ARTERIOVENOUS (AV) GORE-TEX GRAFT RIGHT UPPER ARM;  Surgeon: Elam Dutch, MD;  Location: [place];  Service: Vascular;  Laterality: Right;  . BIOPSY  01/25/2018   Procedure: BIOPSY;  Surgeon: Jerene Bears, MD;  Location: [place];  Service: Gastroenterology;;  . BIOPSY  04/10/2019   Procedure: BIOPSY;  Surgeon: Jerene Bears, MD;  Location: [facility];  Service: Gastroenterology;;  . COLONOSCOPY    . COLONOSCOPY WITH PROPOFOL N/A 01/25/2018   Procedure: COLONOSCOPY WITH PROPOFOL;  Surgeon: Jerene Bears, MD;  Location: [place];  Service: Gastroenterology;  Laterality: N/A;  . CORONARY STENT INTERVENTION N/A 02/22/2017   Procedure: CORONARY STENT INTERVENTION;  Surgeon: Nigel Mormon, MD;  Location: [place] [facility];  Service: Cardiovascular;  Laterality: N/A;  . ESOPHAGOGASTRODUODENOSCOPY (EGD) WITH PROPOFOL N/A 01/25/2018   Procedure: ESOPHAGOGASTRODUODENOSCOPY (EGD) WITH PROPOFOL;  Surgeon: Jerene Bears, MD;  Location: [place];  Service: Gastroenterology;  Laterality: N/A;  . ESOPHAGOGASTRODUODENOSCOPY (EGD) WITH PROPOFOL N/A 04/10/2019   Procedure: ESOPHAGOGASTRODUODENOSCOPY (EGD) WITH PROPOFOL;  Surgeon: Jerene Bears, MD;  Location: [facility];  Service: Gastroenterology;  Laterality: N/A;  . FLEXIBLE SIGMOIDOSCOPY N/A 07/15/2017   Procedure: FLEXIBLE SIGMOIDOSCOPY;  Surgeon: Carol Ada, MD;  Location: [place];  Service:  Endoscopy;  Laterality: N/A;  . HEMORRHOID BANDING    . I & D EXTREMITY Left 06/01/2017   Procedure: IRRIGATION AND DEBRIDEMENT LEFT ARM HEMATOMA WITH LIGATION OF LEFT ARM AV FISTULA;  Surgeon: Elam Dutch, MD;  Location: [place];  Service: Vascular;  Laterality: Left;  . I & D EXTREMITY Left 06/14/2017   Procedure: IRRIGATION AND DEBRIDEMENT EXTREMITY;  Surgeon: Katha Cabal, MD;  Location: [facility];  Service: Vascular;  Laterality: Left;  . INSERTION OF DIALYSIS CATHETER  05/30/2017  . INSERTION OF DIALYSIS CATHETER N/A 05/30/2017   Procedure: INSERTION OF DIALYSIS CATHETER;  Surgeon: Elam Dutch, MD;  Location: [place];  Service: Vascular;  Laterality: N/A;  . IR PARACENTESIS  08/30/2017  . IR PARACENTESIS  09/29/2017  . IR PARACENTESIS  10/28/2017  . IR PARACENTESIS  11/09/2017  . IR PARACENTESIS  11/16/2017  . IR PARACENTESIS  11/28/2017  . IR PARACENTESIS  12/01/2017  . IR PARACENTESIS  12/06/2017  . IR PARACENTESIS  01/03/2018  . IR PARACENTESIS  01/23/2018  . IR PARACENTESIS  02/07/2018  . IR PARACENTESIS  02/21/2018  . IR PARACENTESIS  03/06/2018  . IR PARACENTESIS  03/17/2018  . IR PARACENTESIS  04/04/2018  . IR PARACENTESIS  12/28/2018  . IR PARACENTESIS  01/08/2019  . IR PARACENTESIS  01/23/2019  . IR PARACENTESIS  02/01/2019  . IR PARACENTESIS  02/19/2019  . IR PARACENTESIS  03/01/2019  . IR PARACENTESIS  03/15/2019  . IR PARACENTESIS  04/03/2019  . IR PARACENTESIS  04/12/2019  . IR PARACENTESIS  05/01/2019  . IR PARACENTESIS  05/08/2019  . IR PARACENTESIS  05/24/2019  . IR PARACENTESIS  06/12/2019  . IR PARACENTESIS  07/09/2019  . IR PARACENTESIS  07/27/2019  . IR PARACENTESIS  08/09/2019  . IR PARACENTESIS  08/21/2019  . IR PARACENTESIS  09/17/2019  . IR PARACENTESIS  10/05/2019  . IR PARACENTESIS  10/29/2019  . IR PARACENTESIS  11/08/2019  . IR PARACENTESIS  12/12/2019  . IR PARACENTESIS  01/03/2020  . IR PARACENTESIS  01/10/2020  . IR PARACENTESIS  01/17/2020  . IR PARACENTESIS   01/24/2020  . IR PARACENTESIS  01/31/2020  . IR PARACENTESIS  02/07/2020  . IR RADIOLOGIST EVAL & MGMT  02/14/2018  . IR RADIOLOGIST EVAL & MGMT  02/22/2019  . LEFT HEART CATH AND CORONARY ANGIOGRAPHY N/A 02/22/2017   Procedure: LEFT HEART CATH AND CORONARY ANGIOGRAPHY;  Surgeon: Nigel Mormon, MD;  Location: [place] [facility];  Service: Cardiovascular;  Laterality: N/A;  . LIGATION OF ARTERIOVENOUS  FISTULA Left 1/0/9323   Procedure: Plication of Left Arm Arteriovenous Fistula;  Surgeon: Elam Dutch, MD;  Location: [place];  Service: Vascular;  Laterality: Left;  . POLYPECTOMY    . POLYPECTOMY  01/25/2018   Procedure: POLYPECTOMY;  Surgeon: Jerene Bears, MD;  Location: [place];  Service: Gastroenterology;;  . REVISON OF ARTERIOVENOUS FISTULA Left 5/57/3220   Procedure: PLICATION OF DISTAL ANEURYSMAL SEGEMENT OF LEFT UPPER ARM ARTERIOVENOUS FISTULA;  Surgeon: Elam Dutch, MD;  Location: [place];  Service: Vascular;  Laterality: Left;  . REVISON OF ARTERIOVENOUS FISTULA Left 2/54/2706   Procedure: Plication of Left Upper Arm Fistula ;  Surgeon: Waynetta Sandy, MD;  Location: [place];  Service: Vascular;  Laterality: Left;  . SKIN GRAFT SPLIT THICKNESS LEG / FOOT Left    SKIN GRAFT SPLIT THICKNESS LEFT ARM DONOR SITE: LEFT ANTERIOR THIGH  . SKIN SPLIT GRAFT Left 07/04/2017   Procedure: SKIN GRAFT SPLIT THICKNESS LEFT ARM DONOR SITE: LEFT ANTERIOR THIGH;  Surgeon: Elam Dutch, MD;  Location: [place];  Service: Vascular;  Laterality: Left;  . THROMBECTOMY W/ EMBOLECTOMY Left 06/05/2017   Procedure: EXPLORATION OF LEFT ARM FOR BLEEDING; OVERSEWED PROXIMAL FISTULA;  Surgeon: Angelia Mould, MD;  Location: [place];  Service: Vascular;  Laterality: Left;  . WOUND EXPLORATION Left 06/03/2017   Procedure: WOUND EXPLORATION WITH WOUND VAC APPLICATION TO LEFT ARM;  Surgeon: Angelia Mould, MD;  Location: [place];  Service: Vascular;  Laterality: Left;     reports  that he has been smoking cigarettes. He started smoking about 46 years ago. He has a 21.50 pack-year smoking history. He has never used smokeless tobacco. He reports previous alcohol use. He reports previous drug use. Drugs: Marijuana and Cocaine.  Allergies  Allergen Reactions  . Tramadol Itching and Other (See Comments)  . Morphine And Related Other (See Comments)    Stomach pain  . Pollen Extract Other (See Comments)    Other reaction(s): Sneezing (finding)  . Acetaminophen Nausea Only    Stomach ache   . Aspirin Itching and Other (See Comments)    STOMACH PAIN   .  Clonidine Derivatives Itching    Family History  Problem Relation Age of Onset  . Heart disease Mother   . Lung cancer Mother   . Heart disease Father   . Malignant hyperthermia Father   . COPD Father   . Throat cancer Sister   . Esophageal cancer Sister   . Hypertension Other   . COPD Other   . Colon cancer Neg Hx   . Colon polyps Neg Hx   . Rectal cancer Neg Hx   . Stomach cancer Neg Hx     Prior to Admission medications   Medication Sig Start Date End Date Taking? Authorizing Provider  albuterol (VENTOLIN HFA) 108 (90 Base) MCG/ACT inhaler Inhale 2 puffs into the lungs every 4 (four) hours as needed for wheezing or shortness of breath.  02/03/20   [provider]  amiodarone (PACERONE) 200 MG tablet Take 1 tablet (200 mg total) by mouth daily. 01/29/20   Shelly Coss, MD  cefdinir (OMNICEF) 300 MG capsule Take 1 capsule (300 mg total) by mouth 3 (three) times a week for 4 doses. Please take it after dialysis on days of dialysis which will be MWF. 02/13/20 02/21/20  Darliss Cheney, MD  digoxin 62.5 MCG TABS Take 0.0625 mg by mouth every Monday, Wednesday, and Friday. 02/15/20 03/16/20  Darliss Cheney, MD  diltiazem (CARDIZEM CD) 120 MG 24 hr capsule Take 1 capsule (120 mg total) by mouth daily. 02/14/20 03/15/20  Darliss Cheney, MD  diphenhydrAMINE (BENADRYL) 25 mg capsule Take 1 capsule (25 mg total) by mouth  every 6 (six) hours as needed for itching or allergies. 01/29/20   Shelly Coss, MD  gabapentin (NEURONTIN) 100 MG capsule Take 1 capsule (100 mg total) by mouth 3 (three) times daily. 02/13/20 03/14/20  Darliss Cheney, MD  Oxycodone HCl 10 MG TABS Take 10 mg by mouth 3 (three) times daily as needed (pain).  01/24/20   [provider]  sevelamer carbonate (RENVELA) 800 MG tablet Take 2,400 mg by mouth See admin instructions. Take 3 tablets (2400 mg) by mouth up to three times daily with meals    [provider]  tenofovir (VIREAD) 300 MG tablet Take 300 mg by mouth every Monday.  01/03/20   [provider]    Physical Exam: Vitals:   02/17/20 1153 02/17/20 1200 02/17/20 1215 02/17/20 1300  BP:  127/64 114/64 (!) 131/98  Resp: 13 (!) 26 (!) 27 (!) 25    Constitutional: NAD, calm, comfortable, on 6 L of oxygen via nasal cannula.  Although looks like he does not like to answer however he answers appropriately. Eyes: PERRL, lids and conjunctivae normal ENMT: Mucous membranes are moist. Posterior pharynx clear of any exudate or lesions.Normal dentition.  Neck: normal, supple, no masses, no thyromegaly Respiratory: clear to auscultation bilaterally, no wheezing, no crackles. Normal respiratory effort. No accessory muscle use.  Cardiovascular: Regular rate and rhythm, no murmurs / rubs / gallops. No extremity edema. 2+ pedal pulses. No carotid bruits.  Abdomen: no tenderness, no masses palpated. No hepatosplenomegaly. Bowel sounds positive.  Musculoskeletal: no clubbing / cyanosis. No joint deformity upper and lower extremities. Good ROM, no contractures. Normal muscle tone.  Skin: no rashes, lesions, ulcers. No induration Neurologic: CN 2-12 grossly intact. Sensation intact, DTR normal. Strength 5/5 in all 4.  Psychiatric: Normal judgment and insight. Alert and oriented x 3. Normal mood.    Labs on Admission: I have personally reviewed following labs and imaging  studies  CBC: Recent Labs  Lab 02/11/20 0239 02/12/20 0431 02/13/20 0424 02/17/20 1208  WBC 11.8* 10.1 13.0* 12.1*  NEUTROABS 8.9* 7.4 9.9* 9.6*  HGB 9.2* 9.7* 10.0* 9.6*  HCT 26.5* 27.9* 29.0* 28.8*  MCV 79.3* 81.3 80.8 80.2  PLT 130* 136* 135* 381   Basic Metabolic Panel: Recent Labs  Lab 02/11/20 0239 02/11/20 1123 02/12/20 0431 02/13/20 0424 02/17/20 1208  NA 135  --  134* 134* 135  K 4.8  --  3.6 4.2 6.7*  CL 96*  --  94* 90* 92*  CO2 27  --  29 28 28   GLUCOSE 88  --  123* 134* 105*  BUN 40*  --  18 26* 40*  CREATININE 4.15*  --  3.08* 5.02* 5.89*  CALCIUM 8.7*  --  8.9 9.5 9.6  MG  --   --   --  2.2 2.2  PHOS  --  6.5*  --   --  4.1   GFR: Estimated Creatinine Clearance: 13.1 mL/min (A) (by C-G formula based on SCr of 5.89 mg/dL (H)). Liver Function Tests: Recent Labs  Lab 02/12/20 0431 02/13/20 0424 02/17/20 1208  AST 74* 47* 26  ALT 103* 82* 34  ALKPHOS 222* 239* 272*  BILITOT 1.8* 1.3* 1.2  PROT 6.1* 6.3* 6.3*  ALBUMIN 2.5* 2.5* 2.4*   No results for input(s): LIPASE, AMYLASE in the last 168 hours. Recent Labs  Lab 02/11/20 0239 02/12/20 0431  AMMONIA 36* 35   Coagulation Profile: No results for input(s): INR, PROTIME in the last 168 hours. Cardiac Enzymes: No results for input(s): CKTOTAL, CKMB, CKMBINDEX, TROPONINI in the last 168 hours. BNP (last 3 results) No results for input(s): PROBNP in the last 8760 hours. HbA1C: No results for input(s): HGBA1C in the last 72 hours. CBG: Recent Labs  Lab 02/17/20 1446  GLUCAP 82   Lipid Profile: No results for input(s): CHOL, HDL, LDLCALC, TRIG, CHOLHDL, LDLDIRECT in the last 72 hours. Thyroid Function Tests: No results for input(s): TSH, T4TOTAL, FREET4, T3FREE, THYROIDAB in the last 72 hours. Anemia Panel: No results for input(s): VITAMINB12, FOLATE, FERRITIN, TIBC, IRON, RETICCTPCT in the last 72 hours. Urine analysis:    Component Value Date/Time   COLORURINE YELLOW 07/16/2011 1619    APPEARANCEUR CLEAR 07/16/2011 1619   LABSPEC 1.018 07/16/2011 1619   PHURINE 7.5 07/16/2011 1619   GLUCOSEU 250 (A) 07/16/2011 1619   HGBUR MODERATE (A) 07/16/2011 1619   BILIRUBINUR SMALL (A) 07/16/2011 1619   KETONESUR NEGATIVE 07/16/2011 1619   PROTEINUR >300 (A) 07/16/2011 1619   UROBILINOGEN 1.0 07/16/2011 1619   NITRITE NEGATIVE 07/16/2011 1619   LEUKOCYTESUR SMALL (A) 07/16/2011 1619    Radiological Exams on Admission: DG Chest Portable 1 View  Result Date: 02/17/2020 CLINICAL DATA:  Shortness of breath EXAM: PORTABLE CHEST 1 VIEW COMPARISON:  02/09/2020 FINDINGS: Stable cardiomegaly. Atherosclerotic calcification of the aorta and arch vessels. Mild pulmonary vascular congestion with mildly prominent interstitial markings bilaterally. No focal consolidation. No pleural effusion or pneumothorax. IMPRESSION: Findings suggestive of CHF with mild interstitial edema. Electronically Signed   By: Davina Poke D.O.   On: 02/17/2020 13:10    EKG: Independently reviewed.  A. fib with RVR.  No STEMI.  Assessment/Plan Principal Problem:   Atrial fibrillation with RVR (HCC) Active Problems:   Chronic hepatitis C without hepatic coma (HCC)   Essential hypertension   COPD (chronic obstructive pulmonary disease) (HCC)   Hyperkalemia   Anemia due to chronic kidney disease   Cirrhosis of liver with  ascites (HCC)   Decompensated hepatic cirrhosis (HCC)   Acquired thrombophilia (HCC)   ESRD (end stage renal disease) ([place])   Tobacco use   A. fib with RVR: -Patient has history of noncompliance with medications.  Not a candidate for anticoagulation.  Upon arrival he was noted to be in A. fib with RVR with heart rate in 170s.  He placed on IV Cardizem drip in ED. -He is afebrile has leukocytosis of 12.1, COVID-19 is negative.  Requiring 6 L of oxygen via nasal cannula.  Troponin: 45, BNP: 1372, -Reviewed chest x-ray. -Admit patient to stepdown unit for close monitoring.  Continue IV  Cardizem. -Appreciate cardiology help.  On telemetry.  Monitor heart rate closely.  Check electrolytes. -Monitor vitals closely.  End-stage renal disease on hemodialysis and hyperkalemia: -Status post calcium gluconate, 5 units of IV insulin, 1 amp of D50 W and Lokelma in ED.  Reviewed EKG. -Nephrology has been consulted for emergent dialysis.  History of chronic hepatitis B and hepatitis C with decompensated liver cirrhosis and recurrent ascites with weekly paracentesis: Missed last appointment -Continue tenofovir -Consider IR consult for paracentesis once patient stabilizes.  Anemia of chronic disease: H&H is stable -Continue to monitor  Hypertension: Well-controlled. -Continue IV Cardizem.  We will resume home p.o. meds -Monitor blood pressure closely  Acute hypoxemic respiratory failure 2/2 acute  on chronic diastolic CHF: Reviewed last echo. Currently requiring 6 L. COVID -ve. -BNP: 1372 (below baseline).  Reviewed chest x-ray. -Volume managed by dialysis per nephrology -Strict INO's and daily weight.  Elevated troponin: Initial troponin 45.  Patient denies ACS symptoms.  Likely in the setting of demand ischemia secondary to A. fib with RVR. -Trend troponin  Chronic pain syndrome: Continue oxycodone as needed  Tobacco abuse: Counseled about cessation.  COPD: No wheezing noted on exam.  Continue albuterol as needed.  DVT prophylaxis: SCD  Code Status: Full code-confirmed with the patient Family Communication: None present at bedside.  Plan of care discussed with patient in length and he verbalized understanding and agreed with it. Disposition Plan: Likely home after rate control and dialysis Consults called: Cardiology and nephrology by EDP Admission status: Inpatient   Mckinley Jewel MD [facility]  If 7PM-7AM, please contact night-coverage [email or web address] Password [facility]  02/17/2020, 3:36 PM

## 2020-02-17 NOTE — ED Triage Notes (Signed)
Pt BIB [facility] d/t resp. Distress and A-fib. Ems reports that pt refused transport to hospital the first time they responded to his home, upon their second time at his home pt was in A-fib at a rate of 170. Pt endorses recent Tx for PNE, last dialyzed on Fri, due again this Mon, he gets paracentesis on [place] and missed his last one. EMS reports pt demanded to walk independently while on scene & he converted into vtach & his SOB worsened and was wearing 10L O2 and sating at 96% upon arrival to ED.

## 2020-02-18 LAB — RENAL FUNCTION PANEL
Albumin: 2.3 g/dL — ABNORMAL LOW (ref 3.5–5.0)
Anion gap: 15 (ref 5–15)
BUN: 49 mg/dL — ABNORMAL HIGH (ref 6–20)
CO2: 26 mmol/L (ref 22–32)
Calcium: 9.8 mg/dL (ref 8.9–10.3)
Chloride: 90 mmol/L — ABNORMAL LOW (ref 98–111)
Creatinine, Ser: 6.37 mg/dL — ABNORMAL HIGH (ref 0.61–1.24)
GFR calc Af Amer: 10 mL/min — ABNORMAL LOW (ref >60)
GFR calc non Af Amer: 9 mL/min — ABNORMAL LOW (ref >60)
Glucose, Bld: 108 mg/dL — ABNORMAL HIGH (ref 70–99)
Phosphorus: 4.7 mg/dL — ABNORMAL HIGH (ref 2.5–4.6)
Potassium: 6.5 mmol/L (ref 3.5–5.1)
Sodium: 131 mmol/L — ABNORMAL LOW (ref 135–145)

## 2020-02-18 LAB — CBC
HCT: 27 % — ABNORMAL LOW (ref 39.0–52.0)
Hemoglobin: 9.1 g/dL — ABNORMAL LOW (ref 13.0–17.0)
MCH: 27 pg (ref 26.0–34.0)
MCHC: 33.7 g/dL (ref 30.0–36.0)
MCV: 80.1 fL (ref 80.0–100.0)
Platelets: 180 10*3/uL (ref 150–400)
RBC: 3.37 MIL/uL — ABNORMAL LOW (ref 4.22–5.81)
RDW: 15.7 % — ABNORMAL HIGH (ref 11.5–15.5)
WBC: 10.8 10*3/uL — ABNORMAL HIGH (ref 4.0–10.5)
nRBC: 0 % (ref 0.0–0.2)

## 2020-02-18 LAB — COMPREHENSIVE METABOLIC PANEL
ALT: 32 U/L (ref 0–44)
AST: 26 U/L (ref 15–41)
Albumin: 2.4 g/dL — ABNORMAL LOW (ref 3.5–5.0)
Alkaline Phosphatase: 276 U/L — ABNORMAL HIGH (ref 38–126)
Anion gap: 12 (ref 5–15)
BUN: 16 mg/dL (ref 6–20)
CO2: 28 mmol/L (ref 22–32)
Calcium: 9.2 mg/dL (ref 8.9–10.3)
Chloride: 96 mmol/L — ABNORMAL LOW (ref 98–111)
Creatinine, Ser: 2.9 mg/dL — ABNORMAL HIGH (ref 0.61–1.24)
GFR calc Af Amer: 27 mL/min — ABNORMAL LOW (ref >60)
GFR calc non Af Amer: 23 mL/min — ABNORMAL LOW (ref >60)
Glucose, Bld: 109 mg/dL — ABNORMAL HIGH (ref 70–99)
Potassium: 3.7 mmol/L (ref 3.5–5.1)
Sodium: 136 mmol/L (ref 135–145)
Total Bilirubin: 0.8 mg/dL (ref 0.3–1.2)
Total Protein: 6.3 g/dL — ABNORMAL LOW (ref 6.5–8.1)

## 2020-02-18 LAB — LIPID PANEL
Cholesterol: 140 mg/dL (ref 0–200)
HDL: 38 mg/dL — ABNORMAL LOW (ref >40)
LDL Cholesterol: 81 mg/dL (ref 0–99)
Total CHOL/HDL Ratio: 3.7 RATIO
Triglycerides: 107 mg/dL (ref <150)
VLDL: 21 mg/dL (ref 0–40)

## 2020-02-18 MED ORDER — METOPROLOL TARTRATE 12.5 MG HALF TABLET
12.5000 mg | ORAL_TABLET | Freq: Two times a day (BID) | ORAL | Status: DC
  Administered 2020-02-19: 12.5 mg via ORAL
  Filled 2020-02-18 (×2): qty 1

## 2020-02-18 MED ORDER — DILTIAZEM HCL ER COATED BEADS 120 MG PO CP24
120.0000 mg | ORAL_CAPSULE | Freq: Every day | ORAL | Status: DC
  Administered 2020-02-18 – 2020-02-19 (×2): 120 mg via ORAL
  Filled 2020-02-18 (×2): qty 1

## 2020-02-18 MED ORDER — SODIUM ZIRCONIUM CYCLOSILICATE 10 G PO PACK
10.0000 g | PACK | ORAL | Status: DC
  Filled 2020-02-18: qty 1

## 2020-02-18 MED ORDER — ONDANSETRON HCL 4 MG/2ML IJ SOLN: 4.0000 mg | Freq: Three times a day (TID) | INTRAMUSCULAR | Status: DC | PRN

## 2020-02-18 MED ORDER — OFF THE BEAT BOOK
Freq: Once | Status: AC
  Administered 2020-02-18: 1
  Filled 2020-02-18: qty 1

## 2020-02-18 NOTE — Progress Notes (Signed)
PROGRESS NOTE    [name]  OVF:[id number] DOB: 1961/06/17 DOA: 02/17/2020 PCP: Patient, No Pcp Per   Brief Narrative:  HPI: [name] is a 59 y.o. male with medical history significant of end-stage renal disease on hemodialysis (MWF), hypertension, hyperlipidemia, tobacco abuse, COPD, former alcohol abuse, paroxysmal A. fib, hepatitis B, C, cirrhosis, chronic pain syndrome, coronary artery disease, depression/anxiety, anemia of chronic disease, poor medication compliance presented to emergency department with shortness of breath since 1 day.  This is his 6th admission in the last 1 month with similar symptoms of shortness of breath and respiratory distress and was found to be in A. fib with RVR.  And was started on IV Cardizem drip in ED.  Patient has history of multiple comorbidities as well as noncompliance. On previous hospitalization-cardiology Dr. Einar Gip recommended to consider "referral for convergent procedure at [facility]".  Patient reports shortness of breath associated with orthopnea, not sure about the weight gain, denies leg swelling, palpitation, headache, blurry vision, chest pain, wheezing, cough, abdominal pain, vomiting, diarrhea, bowel changes.  He does not make urine.  He tells me that he has been compliant with his dialysis appointment.  He has history of cirrhosis with recurrent ascites and gets his paracentesis on Thursdays and missed his last appointment.  Reports that he continues to smoke cigarettes every day however denies alcohol or any illicit drug use.  He tells me that he has been compliant with his home medications.  Please note: On previous admission palliative care was consulted and his CODE STATUS was changed from full code to DNR.  I discussed with the patient however he wants to remain full code during this hospitalization.  ED Course: Upon arrival to ED:  found to be in A. fib with RVR, afebrile, hypoxic-placed on 6 L of oxygen via  nasal cannula, CBC shows WBC of 12.1, anemia of chronic disease, CMP shows potassium of 6.7, troponin: 45, BNP: 1372, magnesium, phosphorus: WNL, digoxin level: Low.  COVID-19 negative.  Chest x-ray shows: CHF with mild interstitial edema.  Patient started on IV Cardizem drip, received IV calcium gluconate, 5 units of IV insulin and amp of D50W, Lokelma.  EDP consulted cardiology and nephrology for emergency dialysis.  [facility] hospitalist consulted for admission for A. fib with RVR and fluid overload.  Assessment & Plan:   Principal Problem:   Atrial fibrillation with RVR (HCC) Active Problems:   Chronic hepatitis C without hepatic coma (HCC)   Essential hypertension   COPD (chronic obstructive pulmonary disease) (HCC)   Hyperkalemia   Anemia due to chronic kidney disease   Cirrhosis of liver with ascites (HCC)   Decompensated hepatic cirrhosis (HCC)   Acquired thrombophilia (HCC)   ESRD (end stage renal disease) (HCC)   Tobacco use  A. fib with RVR: Patient presented with RVR and needed to be placed on Cardizem drip.  He soon improved and Cardizem was stopped.  Currently remains in A. fib with controlled heart rate.  Has a longstanding history of noncompliance.  Not on anticoagulation.  Cardiology consulted.  Still waiting for official consultation note.  ESRD on HD with hyperkalemia: Received hyperkalemia treatment as well as emergent dialysis yesterday.  Nephrology on board.  Plan for another dialysis session today.  End-stage renal disease on hemodialysis and hyperkalemia: -Status post calcium gluconate, 5 units of IV insulin, 1 amp of D50 W and Lokelma in ED.  Reviewed EKG. -Nephrology has been consulted for emergent dialysis.  History of chronic hepatitis B  and hepatitis C with decompensated liver cirrhosis and recurrent ascites with weekly paracentesis: Missed last appointment on Thursday.  Has moderate ascites.  Consulted IR for paracentesis.  Continue tenofovir.   Anemia of  chronic disease: H&H is stable -Continue to monitor  Hypertension: Well-controlled.  Continue home medications.  Acute hypoxemic respiratory failure 2/2 acute  on chronic diastolic CHF: Reviewed last echo. Currently requiring 3 L. COVID -ve. -BNP: 1372 (below baseline).  Reviewed chest x-ray. -Volume managed by dialysis per nephrology -Strict INO's and daily weight.  Hopefully after dialysis today, he will be off of oxygen.  Elevated troponin: Initial troponin 45.  Patient denies ACS symptoms.  Has a history of chronically elevated troponin due to ESRD.  Likely in the setting of demand ischemia secondary to A. fib with RVR. -Trend troponin  Chronic pain syndrome: Continue oxycodone as needed  Tobacco abuse: Counseled about cessation.  COPD: No wheezing noted on exam.  Continue albuterol as needed.  DVT prophylaxis: SCDs Start: 02/17/20 1453   Code Status: Full Code  Family Communication: None present at bedside.  Plan of care discussed with patient in length and he verbalized understanding and agreed with it.  Status is: Inpatient  Remains inpatient appropriate because:Inpatient level of care appropriate due to severity of illness   Dispo: The patient is from: Home              Anticipated d/c is to: Home              Anticipated d/c date is: 1 day              Patient currently is not medically stable to d/c.        Estimated body mass index is 21.9 kg/m as calculated from the following:   Height as of this encounter: 5\' 9"  (1.753 m).   Weight as of this encounter: 67.3 kg.  Pressure Injury 01/14/20 Buttocks Right;Upper Stage 2 -  Partial thickness loss of dermis presenting as a shallow open injury with a red, pink wound bed without slough. areas of healing (Active)  01/14/20 0000  Location: Buttocks  Location Orientation: Right;Upper  Staging: Stage 2 -  Partial thickness loss of dermis presenting as a shallow open injury with a red, pink wound bed without  slough.  Wound Description (Comments): areas of healing  Present on Admission: Yes     Nutritional status:               Consultants:   Nephrology and cardiology  Procedures:   None  Antimicrobials:  Anti-infectives (From admission, onward)   Start     Dose/Rate Route Frequency Ordered Stop   02/18/20 1200  tenofovir (VIREAD) tablet 300 mg     Discontinue     300 mg Oral Every Mon 02/17/20 1839           Subjective: Seen and examined.  Feels better.  Had no complaint.  Objective: Vitals:   02/18/20 0155 02/18/20 0500 02/18/20 0849 02/18/20 1000  BP: 112/68 (!) 111/57 127/66   Pulse: 94 94 93   Resp: 19 18 18    Temp: 98.6 F (37 C) 98 F (36.7 C) 98.2 F (36.8 C)   TempSrc: Oral Oral Oral   SpO2: 92% 98% 98%   Weight:  65 kg  67.3 kg  Height:        Intake/Output Summary (Last 24 hours) at 02/18/2020 1356 Last data filed at 02/18/2020 0846 Gross per 24 hour  Intake 1012.9  ml  Output 1525 ml  Net -512.1 ml   Filed Weights   02/17/20 1835 02/18/20 0500 02/18/20 1000  Weight: 65 kg 65 kg 67.3 kg    Examination:  General exam: Appears calm and comfortable  Respiratory system: Clear to auscultation. Respiratory effort normal. Cardiovascular system: S1 & S2 heard, RRR. No JVD, murmurs, rubs, gallops or clicks. No pedal edema. Gastrointestinal system: Abdomen is moderately distended due to ascites, positive fluid shift, soft and nontender. No organomegaly or masses felt. Normal bowel sounds heard. Central nervous system: Alert and oriented. No focal neurological deficits. Extremities: Symmetric 5 x 5 power. Skin: No rashes, lesions or ulcers Psychiatry: Judgement and insight appear poor. Mood & affect appropriate.    Data Reviewed: I have personally reviewed following labs and imaging studies  CBC: Recent Labs  Lab 02/12/20 0431 02/13/20 0424 02/17/20 1208 02/18/20 0001  WBC 10.1 13.0* 12.1* 10.8*  NEUTROABS 7.4 9.9* 9.6*  --   HGB 9.7*  10.0* 9.6* 9.1*  HCT 27.9* 29.0* 28.8* 27.0*  MCV 81.3 80.8 80.2 80.1  PLT 136* 135* 160 716   Basic Metabolic Panel: Recent Labs  Lab 02/12/20 0431 02/13/20 0424 02/17/20 1208 02/18/20 0002 02/18/20 0215  NA 134* 134* 135 131* 136  K 3.6 4.2 6.7* 6.5* 3.7  CL 94* 90* 92* 90* 96*  CO2 29 28 28 26 28   GLUCOSE 123* 134* 105* 108* 109*  BUN 18 26* 40* 49* 16  CREATININE 3.08* 5.02* 5.89* 6.37* 2.90*  CALCIUM 8.9 9.5 9.6 9.8 9.2  MG  --  2.2 2.2  --   --   PHOS  --   --  4.1 4.7*  --    GFR: Estimated Creatinine Clearance: 26.8 mL/min (A) (by C-G formula based on SCr of 2.9 mg/dL (H)). Liver Function Tests: Recent Labs  Lab 02/12/20 0431 02/13/20 0424 02/17/20 1208 02/18/20 0002 02/18/20 0215  AST 74* 47* 26  --  26  ALT 103* 82* 34  --  32  ALKPHOS 222* 239* 272*  --  276*  BILITOT 1.8* 1.3* 1.2  --  0.8  PROT 6.1* 6.3* 6.3*  --  6.3*  ALBUMIN 2.5* 2.5* 2.4* 2.3* 2.4*   No results for input(s): LIPASE, AMYLASE in the last 168 hours. Recent Labs  Lab 02/12/20 0431  AMMONIA 35   Coagulation Profile: No results for input(s): INR, PROTIME in the last 168 hours. Cardiac Enzymes: No results for input(s): CKTOTAL, CKMB, CKMBINDEX, TROPONINI in the last 168 hours. BNP (last 3 results) No results for input(s): PROBNP in the last 8760 hours. HbA1C: No results for input(s): HGBA1C in the last 72 hours. CBG: Recent Labs  Lab 02/17/20 1446 02/17/20 1638  GLUCAP 82 84   Lipid Profile: Recent Labs    02/18/20 0001  CHOL 140  HDL 38*  LDLCALC 81  TRIG 107  CHOLHDL 3.7   Thyroid Function Tests: No results for input(s): TSH, T4TOTAL, FREET4, T3FREE, THYROIDAB in the last 72 hours. Anemia Panel: No results for input(s): VITAMINB12, FOLATE, FERRITIN, TIBC, IRON, RETICCTPCT in the last 72 hours. Sepsis Labs: No results for input(s): PROCALCITON, LATICACIDVEN in the last 168 hours.  Recent Results (from the past 240 hour(s))  SARS Coronavirus 2 by RT PCR  (hospital order, performed in [facility] hospital lab) Nasopharyngeal Nasopharyngeal Swab     Status: None   Collection Time: 02/09/20  3:20 PM   Specimen: Nasopharyngeal Swab  Result Value Ref Range Status   SARS Coronavirus 2  NEGATIVE NEGATIVE Final    Comment: (NOTE) SARS-CoV-2 target nucleic acids are NOT DETECTED.  The SARS-CoV-2 RNA is generally detectable in upper and lower respiratory specimens during the acute phase of infection. The lowest concentration of SARS-CoV-2 viral copies this assay can detect is 250 copies / mL. A negative result does not preclude SARS-CoV-2 infection and should not be used as the sole basis for treatment or other patient management decisions.  A negative result may occur with improper specimen collection / handling, submission of specimen other than nasopharyngeal swab, presence of viral mutation(s) within the areas targeted by this assay, and inadequate number of viral copies (<250 copies / mL). A negative result must be combined with clinical observations, patient history, and epidemiological information.  Fact Sheet for Patients:   StrictlyIdeas.no  Fact Sheet for Healthcare Providers: BankingDealers.co.za  This test is not yet approved or  cleared by the [place] FDA and has been authorized for detection and/or diagnosis of SARS-CoV-2 by FDA under an Emergency Use Authorization (EUA).  This EUA will remain in effect (meaning this test can be used) for the duration of the COVID-19 declaration under Section 564(b)(1) of the Act, 21 U.S.C. section 360bbb-3(b)(1), unless the authorization is terminated or revoked sooner.  Performed at [facility], [place] [address], [place], [place] [address]   SARS Coronavirus 2 by RT PCR (hospital order, performed in [facility] hospital lab) Nasopharyngeal Nasopharyngeal Swab     Status: None   Collection Time: 02/17/20 12:08 PM   Specimen:  Nasopharyngeal Swab  Result Value Ref Range Status   SARS Coronavirus 2 NEGATIVE NEGATIVE Final    Comment: (NOTE) SARS-CoV-2 target nucleic acids are NOT DETECTED.  The SARS-CoV-2 RNA is generally detectable in upper and lower respiratory specimens during the acute phase of infection. The lowest concentration of SARS-CoV-2 viral copies this assay can detect is 250 copies / mL. A negative result does not preclude SARS-CoV-2 infection and should not be used as the sole basis for treatment or other patient management decisions.  A negative result may occur with improper specimen collection / handling, submission of specimen other than nasopharyngeal swab, presence of viral mutation(s) within the areas targeted by this assay, and inadequate number of viral copies (<250 copies / mL). A negative result must be combined with clinical observations, patient history, and epidemiological information.  Fact Sheet for Patients:   StrictlyIdeas.no  Fact Sheet for Healthcare Providers: BankingDealers.co.za  This test is not yet approved or  cleared by the [place] FDA and has been authorized for detection and/or diagnosis of SARS-CoV-2 by FDA under an Emergency Use Authorization (EUA).  This EUA will remain in effect (meaning this test can be used) for the duration of the COVID-19 declaration under Section 564(b)(1) of the Act, 21 U.S.C. section 360bbb-3(b)(1), unless the authorization is terminated or revoked sooner.  Performed at [facility], [place] [address] [place], [place] [address]       Radiology Studies: DG Chest Portable 1 View  Result Date: 02/17/2020 CLINICAL DATA:  Shortness of breath EXAM: PORTABLE CHEST 1 VIEW COMPARISON:  02/09/2020 FINDINGS: Stable cardiomegaly. Atherosclerotic calcification of the aorta and arch vessels. Mild pulmonary vascular congestion with mildly prominent interstitial markings bilaterally. No  focal consolidation. No pleural effusion or pneumothorax. IMPRESSION: Findings suggestive of CHF with mild interstitial edema. Electronically Signed   By: Davina Poke D.O.   On: 02/17/2020 13:10    Scheduled Meds: . (feeding supplement) PROSource Plus  30 mL Oral BID BM  . calcitRIOL  0.75 mcg Oral Q M,W,F-HD  . darbepoetin (ARANESP) injection - DIALYSIS  40 mcg Intravenous Q Mon-HD  . digoxin  62.5 mcg Oral Q M,W,F  . diltiazem  120 mg Oral Daily  . gabapentin  100 mg Oral TID  . multivitamin  1 tablet Oral QHS  . pantoprazole  40 mg Oral BID  . sevelamer carbonate  3,200 mg Oral TID WC  . [START ON 02/19/2020] sodium zirconium cyclosilicate  10 g Oral Once per day on Tue Thu Sat  . tenofovir  300 mg Oral Q Mon   Continuous Infusions: . diltiazem (CARDIZEM) infusion 5 mg/hr (02/17/20 1326)     LOS: 1 day   Time spent: 35 minutes   Darliss Cheney, MD [facility]  02/18/2020, 1:56 PM   To contact the attending provider between 7A-7P or the covering provider during after hours 7P-7A, please log into the web site [email or web address].

## 2020-02-18 NOTE — Progress Notes (Signed)
   02/18/20 0120  Hand-Off documentation  Handoff Given Given to shift RN/LPN  Report given to (Full Name) Glenna Durand, RN  Handoff Received Received from shift RN/LPN  Report received from (Full Name) Shandricka Monroy, RN  Vital Signs  Temp 98.8 F (37.1 C)  Temp Source Oral  Pulse Rate 93  Pulse Rate Source Monitor  Resp (!) 21  BP (!) 98/55  BP Location Left Arm  BP Method Automatic  Patient Position (if appropriate) Lying  Oxygen Therapy  SpO2 93 %  O2 Device Nasal Cannula  O2 Flow Rate (L/min) 3 L/min  Patient Activity (if Appropriate) In bed  Pulse Oximetry Type Continuous  Pain Assessment  Pain Scale 0-10  Pain Score 0  Post-Hemodialysis Assessment  Rinseback Volume (mL) 250 mL  KECN 309 V  Dialyzer Clearance Lightly streaked  Duration of HD Treatment -hour(s) 2.23 hour(s)  Hemodialysis Intake (mL) 500 mL  UF Total -Machine (mL) 2025 mL  Net UF (mL) 1525 mL  Tolerated HD Treatment Yes  Post-Hemodialysis Comments tx not achieved as scheduled, pt requested to be off the machine prior to the full time prescribed.  AVG/AVF Arterial Site Held (minutes) 10 minutes  AVG/AVF Venous Site Held (minutes) 15 minutes  Education / Care Plan  Dialysis Education Provided Yes  Fistula / Graft Right Upper arm Arteriovenous vein graft  Placement Date/Time: 11/22/17 1200   Placed prior to admission: Yes  Orientation: Right  Access Location: Upper arm  Access Type: Arteriovenous vein graft  Site Condition No complications  Fistula / Graft Assessment Present;Thrill;Bruit  Status Deaccessed  Drainage Description None

## 2020-02-18 NOTE — Progress Notes (Addendum)
[place] NOTE   Pharmacy Consult for [facility] Indication: Outpatient Planning  Assessment/Plan:  Pharmacy consulted to investigate outpatient planning for [facility] at discharge due to recurrent hyperkalemia admission. Anticipate Lokelma 10 gm 3-4 times weekly on non-HD days (HD on MWF).   Contacted patient's outpatient [facility] pharmacy and they do carry Lokelma regularly. His copay would be $0 for [facility].  Vertis Kelch, PharmD, BCPS Phone [phone] 02/18/2020       10:14 AM  Please check [email or web address] for unit-specific pharmacist phone numbers

## 2020-02-18 NOTE — Consult Note (Addendum)
CARDIOLOGY CONSULT NOTE  Patient ID: [name] MRN: [id number] DOB/AGE: [birth year] 59 y.o.  Admit date: 02/17/2020 Referring Physician: Darliss Cheney, MD Primary Physician:  Patient, No Pcp Per  Outpatient cardiologist: Dr. Adrian Prows Reason for Consultation: Atrial fibrillation with rapid ventricular rate.  HPI:  [name] is a 59 y.o. male who presents with a chief complaint of " shortness of breath." He past medical history and cardiovascular risk factors include: severe COPD and chronic cor pulmonale with pulmonary hypertension, coronary artery disease s/p stenting to mid RCA in 2018, paroxysmal atrial fibrillation with RVR, hypertensive cardiomyopathy, dietary and medication noncompliance, end-stage renal disease on hemodialysis, chronic hepatitis B and C, tobaccouse, smokes 1/2 ppd, h/omarijuana use andprior cocaine use quit in 2017. Not on anticoagulation due to cirrhosis of liver and noncompliance with Warfarin.  Patient has had multiple ER visits in the recent past during which she is found to be in A. fib with rapid ventricular rate.  Patient is noncompliant with outpatient follow-up, dietary recommendations, and medications as well.  This time patient presents to the hospital with a chief complaint of shortness of breath.  EMS noted that his pulse ox was in the low 90s patient was placed on oxygen and work of breathing had improved.  He was found to be in A. fib with rapid ventricular rate.  He responded well to Cardizem drip once he was in the ER.  Patient states that he was only taking digoxin at home and did not start taking his oral diltiazem.  He denies any active chest pain at rest or with effort related activities.  On presentation patient was found to have mild leukocytosis with a white count of 12,000, hyperkalemia with a potassium level of 6.7.  Patient underwent hemodialysis yesterday.  The plan as of the last visit for was for him to be evaluated at [facility]  by Dr. Willis Modena for possible convergent procedure.   This morning patient states that he is doing well.  His ventricular rate has improved on telemetry it appears that he may be in normal sinus rhythm.  He continues to be on diltiazem drip.  Patient wishes to go home.   ALLERGIES: Allergies  Allergen Reactions  . Tramadol Itching and Other (See Comments)  . Morphine And Related Other (See Comments)    Stomach pain  . Pollen Extract Other (See Comments)    Other reaction(s): Sneezing (finding)  . Acetaminophen Nausea Only    Stomach ache   . Aspirin Itching and Other (See Comments)    STOMACH PAIN   . Clonidine Derivatives Itching    PAST MEDICAL HISTORY: Past Medical History:  Diagnosis Date  . Anemia   . Anxiety   . Arthritis    left shoulder  . Atherosclerosis of aorta ([place])   . Cardiomegaly   . Chest pain    DATE UNKNOWN, C/O PERIODICALLY  . Cocaine abuse ([place])   . COPD exacerbation ([place]) 08/17/2016  . Coronary artery disease    stent 02/22/17  . ESRD (end stage renal disease) on dialysis ([place])    "[name]; MWF" (07/04/2017)  . GERD (gastroesophageal reflux disease)    DATE UNKNOWN  . Hemorrhoids   . Hepatitis B, chronic ([place])   . Hepatitis C   . History of kidney stones   . Hyperkalemia   . Hypertension   . Kidney failure   . Metabolic bone disease    Patient denies  . Mitral stenosis   . Myocardial infarction (  HCC)   . Pneumonia   . Pulmonary edema   . Solitary rectal ulcer syndrome 07/2017   at [facility] for rectal bleeding  . Tubular adenoma of colon     PAST SURGICAL HISTORY: Past Surgical History:  Procedure Laterality Date  . A/V FISTULAGRAM Left 05/26/2017   Procedure: A/V FISTULAGRAM;  Surgeon: Conrad [place], MD;  Location: [place] [facility];  Service: Cardiovascular;  Laterality: Left;  . A/V FISTULAGRAM Right 11/18/2017   Procedure: A/V FISTULAGRAM - Right Arm;  Surgeon: Elam Dutch, MD;  Location: [place] [facility];  Service:  Cardiovascular;  Laterality: Right;  . APPLICATION OF WOUND VAC Left 06/14/2017   Procedure: APPLICATION OF WOUND VAC;  Surgeon: Katha Cabal, MD;  Location: [facility];  Service: Vascular;  Laterality: Left;  . AV FISTULA PLACEMENT  2012   BELIEVED WAS PLACED IN JUNE  . AV FISTULA PLACEMENT Right 08/09/2017   Procedure: Creation Right arm ARTERIOVENOUS BRACHIOCEPOHALIC FISTULA;  Surgeon: Elam Dutch, MD;  Location: [facility];  Service: Vascular;  Laterality: Right;  . AV FISTULA PLACEMENT Right 11/22/2017   Procedure: INSERTION OF ARTERIOVENOUS (AV) GORE-TEX GRAFT RIGHT UPPER ARM;  Surgeon: Elam Dutch, MD;  Location: [place];  Service: Vascular;  Laterality: Right;  . BIOPSY  01/25/2018   Procedure: BIOPSY;  Surgeon: Jerene Bears, MD;  Location: [place];  Service: Gastroenterology;;  . BIOPSY  04/10/2019   Procedure: BIOPSY;  Surgeon: Jerene Bears, MD;  Location: [facility];  Service: Gastroenterology;;  . COLONOSCOPY    . COLONOSCOPY WITH PROPOFOL N/A 01/25/2018   Procedure: COLONOSCOPY WITH PROPOFOL;  Surgeon: Jerene Bears, MD;  Location: [place];  Service: Gastroenterology;  Laterality: N/A;  . CORONARY STENT INTERVENTION N/A 02/22/2017   Procedure: CORONARY STENT INTERVENTION;  Surgeon: Nigel Mormon, MD;  Location: [place] [facility];  Service: Cardiovascular;  Laterality: N/A;  . ESOPHAGOGASTRODUODENOSCOPY (EGD) WITH PROPOFOL N/A 01/25/2018   Procedure: ESOPHAGOGASTRODUODENOSCOPY (EGD) WITH PROPOFOL;  Surgeon: Jerene Bears, MD;  Location: [place];  Service: Gastroenterology;  Laterality: N/A;  . ESOPHAGOGASTRODUODENOSCOPY (EGD) WITH PROPOFOL N/A 04/10/2019   Procedure: ESOPHAGOGASTRODUODENOSCOPY (EGD) WITH PROPOFOL;  Surgeon: Jerene Bears, MD;  Location: [facility];  Service: Gastroenterology;  Laterality: N/A;  . FLEXIBLE SIGMOIDOSCOPY N/A 07/15/2017   Procedure: FLEXIBLE SIGMOIDOSCOPY;  Surgeon: Carol Ada, MD;  Location: [place];  Service:  Endoscopy;  Laterality: N/A;  . HEMORRHOID BANDING    . I & D EXTREMITY Left 06/01/2017   Procedure: IRRIGATION AND DEBRIDEMENT LEFT ARM HEMATOMA WITH LIGATION OF LEFT ARM AV FISTULA;  Surgeon: Elam Dutch, MD;  Location: [place];  Service: Vascular;  Laterality: Left;  . I & D EXTREMITY Left 06/14/2017   Procedure: IRRIGATION AND DEBRIDEMENT EXTREMITY;  Surgeon: Katha Cabal, MD;  Location: [facility];  Service: Vascular;  Laterality: Left;  . INSERTION OF DIALYSIS CATHETER  05/30/2017  . INSERTION OF DIALYSIS CATHETER N/A 05/30/2017   Procedure: INSERTION OF DIALYSIS CATHETER;  Surgeon: Elam Dutch, MD;  Location: [place];  Service: Vascular;  Laterality: N/A;  . IR PARACENTESIS  08/30/2017  . IR PARACENTESIS  09/29/2017  . IR PARACENTESIS  10/28/2017  . IR PARACENTESIS  11/09/2017  . IR PARACENTESIS  11/16/2017  . IR PARACENTESIS  11/28/2017  . IR PARACENTESIS  12/01/2017  . IR PARACENTESIS  12/06/2017  . IR PARACENTESIS  01/03/2018  . IR PARACENTESIS  01/23/2018  . IR PARACENTESIS  02/07/2018  .  IR PARACENTESIS  02/21/2018  . IR PARACENTESIS  03/06/2018  . IR PARACENTESIS  03/17/2018  . IR PARACENTESIS  04/04/2018  . IR PARACENTESIS  12/28/2018  . IR PARACENTESIS  01/08/2019  . IR PARACENTESIS  01/23/2019  . IR PARACENTESIS  02/01/2019  . IR PARACENTESIS  02/19/2019  . IR PARACENTESIS  03/01/2019  . IR PARACENTESIS  03/15/2019  . IR PARACENTESIS  04/03/2019  . IR PARACENTESIS  04/12/2019  . IR PARACENTESIS  05/01/2019  . IR PARACENTESIS  05/08/2019  . IR PARACENTESIS  05/24/2019  . IR PARACENTESIS  06/12/2019  . IR PARACENTESIS  07/09/2019  . IR PARACENTESIS  07/27/2019  . IR PARACENTESIS  08/09/2019  . IR PARACENTESIS  08/21/2019  . IR PARACENTESIS  09/17/2019  . IR PARACENTESIS  10/05/2019  . IR PARACENTESIS  10/29/2019  . IR PARACENTESIS  11/08/2019  . IR PARACENTESIS  12/12/2019  . IR PARACENTESIS  01/03/2020  . IR PARACENTESIS  01/10/2020  . IR PARACENTESIS  01/17/2020  . IR PARACENTESIS   01/24/2020  . IR PARACENTESIS  01/31/2020  . IR PARACENTESIS  02/07/2020  . IR RADIOLOGIST EVAL & MGMT  02/14/2018  . IR RADIOLOGIST EVAL & MGMT  02/22/2019  . LEFT HEART CATH AND CORONARY ANGIOGRAPHY N/A 02/22/2017   Procedure: LEFT HEART CATH AND CORONARY ANGIOGRAPHY;  Surgeon: Nigel Mormon, MD;  Location: [place] [facility];  Service: Cardiovascular;  Laterality: N/A;  . LIGATION OF ARTERIOVENOUS  FISTULA Left 0/07/270   Procedure: Plication of Left Arm Arteriovenous Fistula;  Surgeon: Elam Dutch, MD;  Location: [place];  Service: Vascular;  Laterality: Left;  . POLYPECTOMY    . POLYPECTOMY  01/25/2018   Procedure: POLYPECTOMY;  Surgeon: Jerene Bears, MD;  Location: [place];  Service: Gastroenterology;;  . REVISON OF ARTERIOVENOUS FISTULA Left 5/36/6440   Procedure: PLICATION OF DISTAL ANEURYSMAL SEGEMENT OF LEFT UPPER ARM ARTERIOVENOUS FISTULA;  Surgeon: Elam Dutch, MD;  Location: [place];  Service: Vascular;  Laterality: Left;  . REVISON OF ARTERIOVENOUS FISTULA Left 3/47/4259   Procedure: Plication of Left Upper Arm Fistula ;  Surgeon: Waynetta Sandy, MD;  Location: [place];  Service: Vascular;  Laterality: Left;  . SKIN GRAFT SPLIT THICKNESS LEG / FOOT Left    SKIN GRAFT SPLIT THICKNESS LEFT ARM DONOR SITE: LEFT ANTERIOR THIGH  . SKIN SPLIT GRAFT Left 07/04/2017   Procedure: SKIN GRAFT SPLIT THICKNESS LEFT ARM DONOR SITE: LEFT ANTERIOR THIGH;  Surgeon: Elam Dutch, MD;  Location: [place];  Service: Vascular;  Laterality: Left;  . THROMBECTOMY W/ EMBOLECTOMY Left 06/05/2017   Procedure: EXPLORATION OF LEFT ARM FOR BLEEDING; OVERSEWED PROXIMAL FISTULA;  Surgeon: Angelia Mould, MD;  Location: [place];  Service: Vascular;  Laterality: Left;  . WOUND EXPLORATION Left 06/03/2017   Procedure: WOUND EXPLORATION WITH WOUND VAC APPLICATION TO LEFT ARM;  Surgeon: Angelia Mould, MD;  Location: [facility];  Service: Vascular;  Laterality: Left;    FAMILY  HISTORY: The patient family history includes COPD in his father and another family member; Esophageal cancer in his sister; Heart disease in his father and mother; Hypertension in an other family member; Lung cancer in his mother; Malignant hyperthermia in his father; Throat cancer in his sister.   SOCIAL HISTORY:  The patient  reports that he has been smoking cigarettes. He started smoking about 46 years ago. He has a 21.50 pack-year smoking history. He has never used smokeless tobacco. He reports previous alcohol  use. He reports previous drug use. Drugs: Marijuana and Cocaine.  MEDICATIONS:  . diltiazem (CARDIZEM) infusion 5 mg/hr (02/17/20 1326)    Current Outpatient Medications  Medication Instructions  . albuterol (VENTOLIN HFA) 108 (90 Base) MCG/ACT inhaler 2 puffs, Inhalation, Every 4 hours PRN  . amiodarone (PACERONE) 200 mg, Oral, Daily  . cefdinir (OMNICEF) 300 mg, Oral, 3 times weekly, Please take it after dialysis on days of dialysis which will be MWF.  Marland Kitchen Digoxin 0.0625 mg, Oral, Every M-W-F  . diltiazem (CARDIZEM CD) 120 mg, Oral, Daily  . diphenhydrAMINE (BENADRYL) 25 mg, Oral, Every 6 hours PRN  . gabapentin (NEURONTIN) 100 mg, Oral, 3 times daily  . Oxycodone HCl 10 mg, Oral, 3 times daily PRN  . pantoprazole (PROTONIX) 40 mg, Oral, 2 times daily  . sevelamer carbonate (RENVELA) 2,400 mg, Oral, See admin instructions, Take 3 tablets (2400 mg) by mouth up to three times daily with meals  . tenofovir (VIREAD) 300 mg, Oral, Every Mon   Review of Systems  Constitutional: Negative for chills and fever.  HENT: Negative for hoarse voice and nosebleeds.   Eyes: Negative for discharge, double vision and pain.  Cardiovascular: Negative for chest pain, claudication, dyspnea on exertion, leg swelling, near-syncope, orthopnea, palpitations, paroxysmal nocturnal dyspnea and syncope.  Respiratory: Positive for shortness of breath. Negative for hemoptysis.   Musculoskeletal: Negative  for muscle cramps and myalgias.  Gastrointestinal: Negative for abdominal pain, constipation, diarrhea, hematemesis, hematochezia, melena, nausea and vomiting.  Neurological: Negative for dizziness and light-headedness.  All other systems reviewed and are negative.  PHYSICAL EXAM: Vitals with BMI 02/18/2020 02/18/2020 02/18/2020  Height - - -  Weight - 143 lbs 5 oz -  BMI - 06.30 -  Systolic 160 109 323  Diastolic 66 57 68  Pulse 93 94 94  Some encounter information is confidential and restricted. Go to Review Flowsheets activity to see all data.    Intake/Output Summary (Last 24 hours) at 02/18/2020 0910 Last data filed at 02/18/2020 0120 Gross per 24 hour  Intake 772.9 ml  Output 1525 ml  Net -752.1 ml    Net IO Since Admission: -752.1 mL [02/18/20 0910] CONSTITUTIONAL: Appears older than stated age, hemodynamically stable, no acute distress.  SKIN: Skin is warm and dry. No rash noted. No cyanosis. No pallor. No jaundice HEAD: Normocephalic and atraumatic.  EYES: No scleral icterus MOUTH/THROAT: Moist oral membranes.  NECK: No JVD present. No thyromegaly noted.  LYMPHATIC: No visible cervical adenopathy.  CHEST Normal respiratory effort. No intercostal retractions  LUNGS: Clear to auscultation bilaterally.  No stridor. No wheezes. No rales.  CARDIOVASCULAR: Regular, positive F5-D3, soft holosystolic murmur heard at the apex, radiating to the axilla, no gallops or rubs ABDOMINAL: Soft, mildly distended, ascites present, no obvious organomegaly. EXTREMITIES: No peripheral edema.  Dialysis access in the right upper extremity thrill present. HEMATOLOGIC: No significant bruising NEUROLOGIC: Oriented to person, place, and time. Nonfocal. Normal muscle tone.  PSYCHIATRIC: Normal mood and affect. Normal behavior. Cooperative  RADIOLOGY: DG Chest Portable 1 View  Result Date: 02/17/2020 CLINICAL DATA:  Shortness of breath EXAM: PORTABLE CHEST 1 VIEW COMPARISON:  02/09/2020 FINDINGS: Stable  cardiomegaly. Atherosclerotic calcification of the aorta and arch vessels. Mild pulmonary vascular congestion with mildly prominent interstitial markings bilaterally. No focal consolidation. No pleural effusion or pneumothorax. IMPRESSION: Findings suggestive of CHF with mild interstitial edema. Electronically Signed   By: Davina Poke D.O.   On: 02/17/2020 13:10    LABORATORY DATA: Lab Results  Component Value Date   WBC 10.8 (H) 02/18/2020   HGB 9.1 (L) 02/18/2020   HCT 27.0 (L) 02/18/2020   MCV 80.1 02/18/2020   PLT 180 02/18/2020    Recent Labs  Lab 02/18/20 0215  NA 136  K 3.7  CL 96*  CO2 28  BUN 16  CREATININE 2.90*  CALCIUM 9.2  PROT 6.3*  BILITOT 0.8  ALKPHOS 276*  ALT 32  AST 26  GLUCOSE 109*    Lipid Panel     Component Value Date/Time   CHOL 140 02/18/2020 0001   TRIG 107 02/18/2020 0001   HDL 38 (L) 02/18/2020 0001   CHOLHDL 3.7 02/18/2020 0001   VLDL 21 02/18/2020 0001   LDLCALC 81 02/18/2020 0001    BNP (last 3 results) Recent Labs    11/12/19 2341 01/14/20 1927 02/17/20 1208  BNP 3,672.9* 2,254.1* 1,372.3*    HEMOGLOBIN A1C Lab Results  Component Value Date   HGBA1C 4.8 01/26/2020   MPG 91.06 01/26/2020    Cardiac Panel (last 3 results) No results for input(s): CKTOTAL, CKMB, RELINDX in the last 8760 hours.  Invalid input(s): TROPONINHS  Lab Results  Component Value Date   CKTOTAL 274 (H) 12/23/2010   CKMB 4.6 (H) 12/23/2010     TSH No results for input(s): TSH in the last 8760 hours.   Scheduled Meds: . (feeding supplement) PROSource Plus  30 mL Oral BID BM  . calcitRIOL  0.75 mcg Oral Q M,W,F-HD  . darbepoetin (ARANESP) injection - DIALYSIS  40 mcg Intravenous Q Mon-HD  . digoxin  62.5 mcg Oral Q M,W,F  . diltiazem  120 mg Oral Daily  . gabapentin  100 mg Oral TID  . multivitamin  1 tablet Oral QHS  . off the beat book   Does not apply Once  . pantoprazole  40 mg Oral BID  . sevelamer carbonate  3,200 mg Oral TID  WC  . sodium zirconium cyclosilicate  10 g Oral Daily  . tenofovir  300 mg Oral Q Mon   Continuous Infusions: . diltiazem (CARDIZEM) infusion 5 mg/hr (02/17/20 1326)   PRN Meds:.acetaminophen, albuterol, loperamide, oxyCODONE-acetaminophen  CARDIAC DATABASE: EKG: 02/18/2020: Normal sinus rhythm, 93 bpm, intraventricular conduction delay, compared to prior EKG A. fib transition to normal sinus.  02/05/2020: TTE IMPRESSIONS  1. Left ventricular ejection fraction, by estimation, is 50 to 55%. The  left ventricle has normal function. There is severe left ventricular  hypertrophy. Left ventricular diastolic function could not be evaluated. I  cannot exclude septal hypokinesis.  Endocardium not well visualized. septal motion also is consistent with  LBBB.  2. Right ventricular systolic function is mildly reduced. The right  ventricular size is moderately enlarged. Severely increased right  ventricular wall thickness. There is moderately elevated pulmonary artery  systolic pressure. The estimated right  ventricular systolic pressure is 35.0 mmHg.  3. Left atrial size was severely dilated.  4. Right atrial size was severely dilated.  5. The mitral valve is normal in structure. Mild mitral valve  regurgitation.  6. Dilated TV annulus. Tricuspid valve regurgitation is moderate to  severe.  7. AV sclerosis without stenosis. The aortic valve is tricuspid. Aortic  valve regurgitation is trivial. Mild aortic valve sclerosis is present,  with no evidence of aortic valve stenosis. Aortic valve mean gradient  measures 8.3 mmHg.  8. The inferior vena cava is dilated in size with <50% respiratory  variability, suggesting right atrial pressure of 15 mmHg.   IMPRESSION &  RECOMMENDATIONS: [name] is a 59 y.o. male whose past medical history and cardiovascular risk factors include: severe COPD and chronic cor pulmonale with pulmonary hypertension, coronary artery disease s/p  stenting to mid RCA in 2018, paroxysmal atrial fibrillation with RVR, hypertensive cardiomyopathy, dietary and medication noncompliance, end-stage renal disease on hemodialysis, chronic hepatitis B and C, tobaccouse, smokes 1/2 ppd, h/omarijuana use andprior cocaine use quit in 2017.  Atrial fibrillation, paroxysmal:  EKG obtained at bedside notes normal sinus rhythm.    We will start diltiazem 120 mg p.o. day (his home dose).  With the goals of weaning him off of the Cardizem drip.  Patient currently on digoxin 62.5 mcg every Monday Wednesday and Friday. Patient was started on digoxin during the last hospitalization as blood pressures were soft and continued to have elevated heart rates.  Since his systolic blood pressures have now improved recommend discontinuing digoxin.  Start Lopressor 12.5 mg p.o. twice daily with holding parameters.  CHA2DS2Vasc is 2, not anticoagulated 2/2liver disease, kidney disease,  and noncompliance.  During prior hospitalization patient has been evaluated by cardiac electrophysiology.  Please see their consultation notes.  Patient is still encouraged to follow-up with [facility] for possible convergent procedure.   Plan of care discussed with the patient's nurse.  Outpatient follow-up with Dr. Einar Gip.  Total encounter time 83 minutes. *Total Encounter Time as defined by the Centers for Medicare and Medicaid Services includes, in addition to the face-to-face time of a patient visit (documented in the note above) non-face-to-face time: obtaining and reviewing outside history, ordering and reviewing medications, tests or procedures, complex decision making, care coordination (communications with other health care professionals or caregivers) and documentation in the medical record.  Patient's questions and concerns were addressed to his satisfaction. He voices understanding of the instructions provided during this encounter.   This note was created using a voice  recognition software as a result there may be grammatical errors inadvertently enclosed that do not reflect the nature of this encounter. Every attempt is made to correct such errors.  Rex Kras, DO, [facility]. [place] Office: [phone] 02/18/2020, 9:10 AM

## 2020-02-18 NOTE — Progress Notes (Addendum)
[facility] Progress Note    Subjective: Had HD overnight, K+ down to 3.7.   Objective Vitals:   02/18/20 0120 02/18/20 0155 02/18/20 0500 02/18/20 0849  BP: (!) 98/55 112/68 (!) 111/57 127/66  Pulse: 93 94 94 93  Resp: (!) 21 19 18 18   Temp: 98.8 F (37.1 C) 98.6 F (37 C) 98 F (36.7 C)   TempSrc: Oral Oral Oral   SpO2: 93% 92% 98%   Weight:   65 kg   Height:       Physical Exam General: Chronically ill appearing male in NAD Heart: irregular, irregular. AFIB with BBB. Rate currently 94-100s.  Lungs: Decreased in bases, otherwise CTAB. No WOB.  Abdomen: Distended, sig ascites present. Active BS.  Extremities: No LE edema.  Dialysis Access: R AVG + bruit   Additional Objective Labs: Basic Metabolic Panel: Recent Labs  Lab 02/11/20 1123 02/12/20 0431 02/17/20 1208 02/18/20 0002 02/18/20 0215  NA  --    < > 135 131* 136  K  --    < > 6.7* 6.5* 3.7  CL  --    < > 92* 90* 96*  CO2  --    < > 28 26 28   GLUCOSE  --    < > 105* 108* 109*  BUN  --    < > 40* 49* 16  CREATININE  --    < > 5.89* 6.37* 2.90*  CALCIUM  --    < > 9.6 9.8 9.2  PHOS 6.5*  --  4.1 4.7*  --    < > = values in this interval not displayed.   Liver Function Tests: Recent Labs  Lab 02/13/20 0424 02/13/20 0424 02/17/20 1208 02/18/20 0002 02/18/20 0215  AST 47*  --  26  --  26  ALT 82*  --  34  --  32  ALKPHOS 239*  --  272*  --  276*  BILITOT 1.3*  --  1.2  --  0.8  PROT 6.3*  --  6.3*  --  6.3*  ALBUMIN 2.5*   < > 2.4* 2.3* 2.4*   < > = values in this interval not displayed.   No results for input(s): LIPASE, AMYLASE in the last 168 hours. CBC: Recent Labs  Lab 02/12/20 0431 02/12/20 0431 02/13/20 0424 02/17/20 1208 02/18/20 0001  WBC 10.1   < > 13.0* 12.1* 10.8*  NEUTROABS 7.4  --  9.9* 9.6*  --   HGB 9.7*   < > 10.0* 9.6* 9.1*  HCT 27.9*   < > 29.0* 28.8* 27.0*  MCV 81.3  --  80.8 80.2 80.1  PLT 136*   < > 135* 160 180   < > = values in this interval not  displayed.   Blood Culture    Component Value Date/Time   SDES BLOOD RIGHT HAND 02/04/2020 0930   SPECREQUEST  02/04/2020 0930    BOTTLES DRAWN AEROBIC AND ANAEROBIC Blood Culture results may not be optimal due to an inadequate volume of blood received in culture bottles   CULT  02/04/2020 0930    NO GROWTH 5 DAYS Performed at [facility], [place] [address], [place], [place] [address]    REPTSTATUS 02/09/2020 FINAL 02/04/2020 0930    Cardiac Enzymes: No results for input(s): CKTOTAL, CKMB, CKMBINDEX, TROPONINI in the last 168 hours. CBG: Recent Labs  Lab 02/17/20 1446 02/17/20 1638  GLUCAP 82 84   Iron Studies: No results for input(s): IRON, TIBC,  TRANSFERRIN, FERRITIN in the last 72 hours. @lablastinr3 @ Studies/Results: DG Chest Portable 1 View  Result Date: 02/17/2020 CLINICAL DATA:  Shortness of breath EXAM: PORTABLE CHEST 1 VIEW COMPARISON:  02/09/2020 FINDINGS: Stable cardiomegaly. Atherosclerotic calcification of the aorta and arch vessels. Mild pulmonary vascular congestion with mildly prominent interstitial markings bilaterally. No focal consolidation. No pleural effusion or pneumothorax. IMPRESSION: Findings suggestive of CHF with mild interstitial edema. Electronically Signed   By: Davina Poke D.O.   On: 02/17/2020 13:10   Medications: . diltiazem (CARDIZEM) infusion 5 mg/hr (02/17/20 1326)   . (feeding supplement) PROSource Plus  30 mL Oral BID BM  . calcitRIOL  0.75 mcg Oral Q M,W,F-HD  . darbepoetin (ARANESP) injection - DIALYSIS  40 mcg Intravenous Q Mon-HD  . digoxin  62.5 mcg Oral Q M,W,F  . diltiazem  120 mg Oral Daily  . gabapentin  100 mg Oral TID  . multivitamin  1 tablet Oral QHS  . pantoprazole  40 mg Oral BID  . sevelamer carbonate  3,200 mg Oral TID WC  . sodium zirconium cyclosilicate  10 g Oral Daily  . tenofovir  300 mg Oral Q Mon     HD orders: [place] MWF 4 hrs 180NRe 450/800 61 kg 2.0 K/2.0 Ca -No heparin -Mircera 100 mcg IV q 2  weeks (last 01/09/2020 recently restarted hasn't rec'd dose yet).  -Venofer 50 mg IV weekly (recently restarted, hasn't rec'd dose yet).  -Calcitriol 0.75 mcg PO TIW  Assessment/Plan: 1. Hyperkalemia-urgent HD this evening. Has rec'd Lokelma, insulin, Ca+ gluc, D50W and got HD overnight, K+ wnl today.  Hyperkalemia has become a chronic issue, may need scheduled lokelma tiw, will d/w pt.  2. Afib RVR-reports compliance with home meds. Currently on Cardizem gtt rate 90-100s. Not a candidate for anticoagulation. Per primary. Looks like he has a chronic LBBB as well perhaps looking back.  3. Cirrhosis-missed paracentesis last week. Spoke w/ pmd will order LVP w/ IR.  4. ESRD -MWF. Plan short HD today to get back on schedule.  5. Anemia - HGB 9.6. Give Aranesp 40 mcg IV with HD tomorrow. Follow HGB. Transfuse as needed.  6. Secondary hyperparathyroidism - Last OP labs 02/13/2020 Ca/PO4 at goal. PTH elevated. Continue binders, VDRA.  7. HTN/volume - standing wt today is 67.3 kg, which is about 5-6kg up. Plan 2-3 L UF and w/ paracentesis should bring [place] close to dry wt.  8. Nutrition - Chronically low albumin. Renal diet with protein supps.renal vits.   [name], MD 02/18/2020, 9:52 AM

## 2020-02-18 NOTE — Progress Notes (Addendum)
CRITICAL VALUE ALERT  Critical Value:  K+  6.5 Date & Time Notifed:  8/9 00:52  Provider Notified:  Orders Received/Actions taken: Lab was drawn during dialysis; spoke with dialysis RN and they are going to redraw lab at end of treatment. Will continue to monitor.  Labs redrawn @ 2:25 post dialysis K+ 3.7

## 2020-02-19 MED ORDER — METOPROLOL TARTRATE 25 MG PO TABS: 12.5000 mg | ORAL_TABLET | Freq: Two times a day (BID) | ORAL | 0 refills | Status: DC

## 2020-02-19 MED ORDER — DARBEPOETIN ALFA 40 MCG/0.4ML IJ SOSY
PREFILLED_SYRINGE | INTRAMUSCULAR | Status: AC
  Administered 2020-02-19: 40 ug
  Filled 2020-02-19: qty 0.4

## 2020-02-19 MED ORDER — METOPROLOL TARTRATE 25 MG PO TABS: 25.0000 mg | ORAL_TABLET | Freq: Two times a day (BID) | ORAL | Status: DC

## 2020-02-19 MED FILL — METOPROLOL TARTRATE 25 MG T: 25 | 30 days supply | Qty: 30 | Fill #0

## 2020-02-19 NOTE — Discharge Instructions (Signed)

## 2020-02-19 NOTE — Progress Notes (Signed)
Discharge instructions were reviewed and given to pt by RN Amor. Pt d/c'd with his belongings, escorted by unit staff in wheelchair. Pt's ride at main entrance he stated. 1 Script was filled by [facility] and was delivered to pt's room before leaving.

## 2020-02-19 NOTE — Progress Notes (Signed)
Subjective:  States that he is feeling well and would like to go home.  Intake/Output from previous day:  I/O last 3 completed shifts: In: 1669 [P.O.:1560; I.V.:109] Out: 4525 [Other:4525] No intake/output data recorded.  Blood pressure 123/66, pulse 98, temperature 98.5 F (36.9 C), temperature source Oral, resp. rate 16, height _0  (1.753 m), weight 64.9 kg, SpO2 95 %. Physical Exam Vitals reviewed.  Constitutional:      General: He is not in acute distress.    Appearance: He is cachectic. He is ill-appearing.  Cardiovascular:     Rate and Rhythm: Normal rate and regular rhythm.     Heart sounds: Murmur heard.  Early systolic murmur is present with a grade of 2/6 at the upper right sternal border.  Low-pitched rumbling crescendo presystolic murmur is present with a grade of 3/4 at the apex.      Comments: S1 normal and S2 loud. No murmur. No JVD Right arm AV graft noted Pulmonary:     Effort: Pulmonary effort is normal. No accessory muscle usage or respiratory distress.     Breath sounds: Normal breath sounds.  Abdominal:     General: Bowel sounds are normal. There is distension.     Palpations: Abdomen is soft.     Comments: Ascites present  Musculoskeletal:        General: Normal range of motion.     Lab Results: BMP BNP (last 3 results) Recent Labs    11/12/19 2341 01/14/20 1927 02/17/20 1208  BNP 3,672.9* 2,254.1* 1,372.3*    ProBNP (last 3 results) No results for input(s): PROBNP in the last 8760 hours. BMP Latest Ref Rng & Units 02/18/2020 02/18/2020 02/17/2020  Glucose 70 - 99 mg/dL 109(H) 108(H) 105(H)  BUN 6 - 20 mg/dL 16 49(H) 40(H)  Creatinine 0.61 - 1.24 mg/dL 2.90(H) 6.37(H) 5.89(H)  Sodium 135 - 145 mmol/L 136 131(L) 135  Potassium 3.5 - 5.1 mmol/L 3.7 6.5(HH) 6.7(HH)  Chloride 98 - 111 mmol/L 96(L) 90(L) 92(L)  CO2 22 - 32 mmol/L _1 Calcium 8.9 - 10.3 mg/dL 9.2 9.8 9.6   Hepatic Function Latest Ref Rng & Units 02/18/2020 02/18/2020 02/17/2020   Total Protein 6.5 - 8.1 g/dL 6.3(L) - 6.3(L)  Albumin 3.5 - 5.0 g/dL 2.4(L) 2.3(L) 2.4(L)  AST 15 - 41 U/L 26 - 26  ALT 0 - 44 U/L 32 - 34  Alk Phosphatase 38 - 126 U/L 276(H) - 272(H)  Total Bilirubin 0.3 - 1.2 mg/dL 0.8 - 1.2  Bilirubin, Direct 0.0 - 0.2 mg/dL - - -   CBC Latest Ref Rng & Units 02/18/2020 02/17/2020 02/13/2020  WBC 4.0 - 10.5 K/uL 10.8(H) 12.1(H) 13.0(H)  Hemoglobin 13.0 - 17.0 g/dL 9.1(L) 9.6(L) 10.0(L)  Hematocrit 39 - 52 % 27.0(L) 28.8(L) 29.0(L)  Platelets 150 - 400 K/uL 180 160 135(L)   Lipid Panel     Component Value Date/Time   CHOL 140 02/18/2020 0001   TRIG 107 02/18/2020 0001   HDL 38 (L) 02/18/2020 0001   CHOLHDL 3.7 02/18/2020 0001   VLDL 21 02/18/2020 0001   LDLCALC 81 02/18/2020 0001   Cardiac Panel (last 3 results) No results for input(s): CKTOTAL, CKMB, TROPONINI, RELINDX in the last 72 hours.  HEMOGLOBIN A1C Lab Results  Component Value Date   HGBA1C 4.8 01/26/2020   MPG 91.06 01/26/2020   TSH No results for input(s): TSH in the last 8760 hours. Imaging: Imaging results have been reviewed  Cardiac Studies:  EKG: A.  Fib with RVR. LBBB..  Echocardiogram 02/05/2020:  1. Left ventricular ejection fraction, by estimation, is 50 to 55%. The  left ventricle has normal function. There is severe left ventricular  hypertrophy. Left ventricular diastolic function could not be evaluated. I  cannot exclude septal hypokinesis.  Endocardium not well visualized. septal motion also is consistent with LBBB.  2. Right ventricular systolic function is mildly reduced. The right  ventricular size is moderately enlarged. Severely increased right  ventricular wall thickness. There is moderately elevated pulmonary artery  systolic pressure. The estimated right  ventricular systolic pressure is 57.0 mmHg.  3. Left atrial size was severely dilated.  4. Right atrial size was severely dilated.  5. The mitral valve is normal in structure. Mild mitral valve  regurgitation.  6. Dilated TV annulus. Tricuspid valve regurgitation is moderate to severe.  7. AV sclerosis without stenosis. The aortic valve is tricuspid. Aortic valve regurgitation is trivial. Mild aortic valve sclerosis is present,  with no evidence of aortic valve stenosis. Aortic valve mean gradient  measures 8.3 mmHg.  8. The inferior vena cava is dilated in size with <50% respiratory  variability, suggesting right atrial pressure of 15 mmHg.   Scheduled Meds: . (feeding supplement) PROSource Plus  30 mL Oral BID BM  . calcitRIOL  0.75 mcg Oral Q M,W,F-HD  . darbepoetin (ARANESP) injection - DIALYSIS  40 mcg Intravenous Q Mon-HD  . diltiazem  120 mg Oral Daily  . gabapentin  100 mg Oral TID  . metoprolol tartrate  12.5 mg Oral BID  . multivitamin  1 tablet Oral QHS  . pantoprazole  40 mg Oral BID  . sevelamer carbonate  3,200 mg Oral TID WC  . sodium zirconium cyclosilicate  10 g Oral Once per day on Tue Thu Sat  . tenofovir  300 mg Oral Q Mon   Continuous Infusions: PRN Meds:.acetaminophen, albuterol, loperamide, ondansetron (ZOFRAN) IV, oxyCODONE-acetaminophen  Assessment/Plan:  1.  Respiratory failure secondary to fluid overload state in a patient who is an uric and end-stage renal disease on hemodialysis. 2.  Recurrent hyperkalemia 3.  Permanent atrial fibrillation with RVR  Recommendation: I suspect A. fib is not the culprit for his recurrent readmission.  Every admission that he has had has been related to hyperkalemia and fluid overload state.  I had a lengthy discussion with the patient in detail regarding his compliance with fluid restriction and also food restrictions.  Unfortunately patient does not eat at home, essentially eats in [employer] mostly and has been making very unhealthy choices due to his home situation.  Also he has been drinking apple juice fairly frequently.  To improve his strength, he thought that he should be drinking Ensure and has been using  this 1-2 bottles almost on a daily basis.  I suspect his presentation is most consistent with dietary nondiscretion's needs dietary consult prior to discharge.  Although he has A. fib with RVR, he has been tolerating the heart rate for many years for now.  I have discussed my findings with the nephrology PA who will be addressing this.   Adrian Prows, MD, [facility] 02/19/2020, 10:48 AM Office: [phone]

## 2020-02-19 NOTE — Discharge Summary (Signed)
Physician Discharge Summary  [name] KGM:[id number] DOB: 02-09-1961 DOA: 02/17/2020  PCP: Patient, No Pcp Per  Admit date: 02/17/2020 Discharge date: 02/19/2020  Admitted From: Home Disposition: Home  Recommendations for Outpatient Follow-up:  1. Follow up with PCP in 1-2 weeks 2. Follow with primary cardiologist in 1 to 2 weeks 3. Follow with regular hemodialysis but he rescheduled 4. Please obtain BMP/CBC in one week 5. Please follow up with your PCP on the following pending results: Unresulted Labs (From admission, onward) Comment          Start     Ordered   02/19/20 0738  CBC with Differential/Platelet  ONCE - STAT,   STAT       Question:  Specimen collection method  Answer:  Lab=Lab collect   02/19/20 0737   02/19/20 0500  CBC with Differential/Platelet  Tomorrow morning,   R       Question:  Specimen collection method  Answer:  Lab=Lab collect   02/18/20 1402   Signed and Held  Renal function panel  Once,   R        Signed and Held   Signed and Held  CBC  Once,   R        Signed and [place]: None Equipment/Devices: None  Discharge Condition: Stable CODE STATUS: Full code Diet recommendation: Low potassium/low sodium/renal  Subjective: Seen and examined.  He has no complaints.  Wants to go home.  Does not want the labs drawn.  Does not want paracentesis done.  Joshua Mallek Hoskinsis a 59 y.o.malewith medical history significant ofend-stage renal disease on hemodialysis(MWF), hypertension, hyperlipidemia, tobacco abuse, COPD, former alcohol abuse, paroxysmal A. fib, hepatitis B, C, cirrhosis, chronic pain syndrome, coronary artery disease, depression/anxiety, anemia of chronic disease, poor medication compliance presented to emergency department with shortness of breath since 1 day.  This is his 6thadmission in the last 1 month with similar symptoms of shortness of breath and respiratory distress and was found to be in A. fib with  RVR. And was started on IV Cardizem drip in ED. Patient has history of multiple comorbidities as well as noncompliance.On previous hospitalization-cardiologyDr. Ganjirecommended to consider "referral for convergent procedure at [facility]".  Patient reports shortness of breath associated with orthopnea, not sure about the weight gain, denies leg swelling, palpitation, headache, blurry vision, chest pain, wheezing, cough, abdominal pain, vomiting, diarrhea, bowel changes. He does not make urine. He tells me that he has been compliant with his dialysis appointment.  He has history of cirrhosis with recurrent ascites and gets his paracentesis on Thursdays and missed his last appointment.  Reports that he continues to smoke cigarettes every day however denies alcohol or any illicit drug use. He tells me that he has been compliant with his home medications.  Please note: On previous admission palliative care was consulted and his CODE STATUS was changed from full code to DNR. I discussed with the patient however he wants to remain full code during this hospitalization.  ED Course:Upon arrival to ED: found to be in A. fib with RVR,afebrile, hypoxic-placed on 6 L of oxygen via nasal cannula, CBC shows WBC of 12.1, anemia of chronic disease, CMP shows potassium of 6.7, troponin: 45, BNP: 1372, magnesium, phosphorus: WNL, digoxin level: Low. COVID-19 negative. Chest x-ray shows: CHF with mild interstitial edema. Patient started on IV Cardizem drip, received IV calcium gluconate, 5 units of IV insulin and amp of  D50W, Lokelma. EDP consulted cardiology and nephrology for emergency dialysis. [facility] hospitalist consulted for admission for A. fib with RVR and fluid overload.  Brief/Interim Summary: Patient was admitted primarily with atrial fibrillation with RVR. and needed to be placed on Cardizem drip.  He soon improved and Cardizem was stopped.  He then converted back to sinus rhythm.  His home  medications were resumed which included p.o. Cardizem.  He was added low-dose Lopressor 12.5 mg p.o. twice daily by cardiology.  He is not on any anticoagulation per his primary cardiologist.  Patient also was having acute hypoxic respiratory failure secondary to volume overload secondary to missing his hemodialysis.  He received urgent hemodialysis on the day of admission as well as yesterday.  He is weaned down to room air and saturating more than 92%.  He gets weekly paracentesis therapeutic on Thursdays.  He missed his paracentesis last week.  He still has moderate ascites.  IR was consulted yesterday for therapeutic paracentesis but for some reason this was not completed.  Patient is doing fine now.  He was cleared by cardiology yesterday to discharge home and recommendation was to discontinue his digoxin and add Lopressor.  I strongly advised this patient to have paracentesis done before discharge but he is very adamant on going home and he says that he will follow up on Thursday for routine paracentesis.  He also refused lab draws this morning.  He had hyperkalemia for which he received leukemia and dialysis.  We found out that patient was not abiding to renal diet and he was having high potassium diet.  He has a longstanding history of noncompliance which is the main reason for his multiple readmissions.  If he continues to remain noncompliance, he will be at risk of readmission again.Marland Kitchen  He is being discharged in stable condition.  Discharge Diagnoses:  Principal Problem:   Atrial fibrillation with RVR (HCC) Active Problems:   Chronic hepatitis C without hepatic coma (HCC)   Essential hypertension   COPD (chronic obstructive pulmonary disease) (HCC)   Hyperkalemia   Anemia due to chronic kidney disease   Cirrhosis of liver with ascites (HCC)   Decompensated hepatic cirrhosis (HCC)   Acquired thrombophilia (HCC)   ESRD (end stage renal disease) ([place])   Tobacco use    Discharge  Instructions  Discharge Instructions    Amb referral to [facility]   Complete by: As directed      Allergies as of 02/19/2020      Reactions   Tramadol Itching, Other (See Comments)   Morphine And Related Other (See Comments)   Stomach pain   Pollen Extract Other (See Comments)   Other reaction(s): Sneezing (finding)   Acetaminophen Nausea Only   Stomach ache   Aspirin Itching, Other (See Comments)   STOMACH PAIN   Clonidine Derivatives Itching      Medication List    STOP taking these medications   Digoxin 62.5 MCG Tabs     TAKE these medications   albuterol 108 (90 Base) MCG/ACT inhaler Commonly known as: VENTOLIN HFA Inhale 2 puffs into the lungs every 4 (four) hours as needed for wheezing or shortness of breath.   amiodarone 200 MG tablet Commonly known as: PACERONE Take 1 tablet (200 mg total) by mouth daily.   cefdinir 300 MG capsule Commonly known as: OMNICEF Take 1 capsule (300 mg total) by mouth 3 (three) times a week for 4 doses. Please take it after dialysis on days of dialysis which will  be MWF. What changed:   when to take this  additional instructions   diltiazem 120 MG 24 hr capsule Commonly known as: CARDIZEM CD Take 1 capsule (120 mg total) by mouth daily.   diphenhydrAMINE 25 mg capsule Commonly known as: BENADRYL Take 1 capsule (25 mg total) by mouth every 6 (six) hours as needed for itching or allergies.   gabapentin 100 MG capsule Commonly known as: NEURONTIN Take 1 capsule (100 mg total) by mouth 3 (three) times daily.   metoprolol tartrate 25 MG tablet Commonly known as: LOPRESSOR Take 0.5 tablets (12.5 mg total) by mouth 2 (two) times daily.   Oxycodone HCl 10 MG Tabs Take 10 mg by mouth 3 (three) times daily as needed (pain).   pantoprazole 40 MG tablet Commonly known as: PROTONIX Take 40 mg by mouth 2 (two) times daily.   sevelamer carbonate 800 MG tablet Commonly known as: RENVELA Take 2,400 mg by mouth See admin  instructions. Take 3 tablets (2400 mg) by mouth up to three times daily with meals   tenofovir 300 MG tablet Commonly known as: VIREAD Take 300 mg by mouth every Monday.       Allergies  Allergen Reactions  . Tramadol Itching and Other (See Comments)  . Morphine And Related Other (See Comments)    Stomach pain  . Pollen Extract Other (See Comments)    Other reaction(s): Sneezing (finding)  . Acetaminophen Nausea Only    Stomach ache   . Aspirin Itching and Other (See Comments)    STOMACH PAIN   . Clonidine Derivatives Itching    Consultations: Cardiology and nephrology   Procedures/Studies: DG Chest 2 View  Result Date: 02/09/2020 CLINICAL DATA:  59 year old with palpitations, generalized weakness and shortness of breath. The patient was seen in the emergency department yesterday for atrial fibrillation with rapid ventricular response, and recurrence of the atrial fibrillation was noted upon EMS arrival. Patient had successful cardioversion with Cardizem after unsuccessful attempts with adenosine. EXAM: CHEST - 2 VIEW COMPARISON:  02/08/2020 and earlier. FINDINGS: Cardiac silhouette markedly enlarged, unchanged. Thoracic aorta and proximal great vessels severely atherosclerotic. Prominent central pulmonary arteries, unchanged. Lungs clear. Mild pulmonary venous hypertension without overt edema. No visible pleural effusions. IMPRESSION: Stable marked cardiomegaly. No acute cardiopulmonary disease. Electronically Signed   By: Evangeline Dakin M.D.   On: 02/09/2020 14:40   DG Chest 2 View  Result Date: 02/03/2020 CLINICAL DATA:  59 year old male with chest pain and shortness of breath. EXAM: CHEST - 2 VIEW COMPARISON:  Chest radiograph dated 01/25/2020. FINDINGS: There is mild eventration of the left hemidiaphragm with left lung base atelectasis. There is no focal consolidation, pleural effusion, or pneumothorax. Stable cardiomegaly with probable mild vascular congestion. No edema.  Atherosclerotic calcification of the aorta. No acute osseous pathology. IMPRESSION: Cardiomegaly with probable mild vascular congestion. No focal consolidation. Electronically Signed   By: Anner Crete M.D.   On: 02/03/2020 20:37   DG Chest 2 View  Result Date: 01/20/2020 CLINICAL DATA:  Burning sensation in chest. EXAM: CHEST - 2 VIEW COMPARISON:  January 19, 2020 FINDINGS: Stable cardiomegaly. Hila and mediastinum are normal. Mild haziness in the periphery of the lungs bilaterally. Mild atelectasis in the left base. No other acute abnormalities. IMPRESSION: Mild haziness in the peripheries of the lungs bilaterally may represent overlapping soft tissues or subtle developing infiltrates. Recommend clinical correlation and close attention on follow-up. Electronically Signed   By: Dorise Bullion III M.D   On: 01/20/2020 16:23  US Abdomen Limited  Result Date: 02/10/2020 CLINICAL DATA:  History of cirrhosis. Please perform ascites search ultrasound and ultrasound-guided paracentesis for therapeutic purposes as indicated. EXAM: LIMITED ABDOMEN ULTRASOUND FOR ASCITES TECHNIQUE: Limited ultrasound survey for ascites was performed in all four abdominal quadrants. COMPARISON:  Multiple previous ultrasound-guided paracenteses, most recently on 02/07/2020 yielding 780 cc of peritoneal fluid. FINDINGS: Sonographic evaluation of the abdomen demonstrates a trace amount of intra-abdominal ascites, too small to allow for safe ultrasound-guided paracentesis. No paracentesis attempted. IMPRESSION: Trace amount of intra-abdominal ascites, too small to allow for safe ultrasound-guided paracentesis. Electronically Signed   By: Sandi Mariscal M.D.   On: 02/10/2020 10:15   DG Chest Portable 1 View  Result Date: 02/17/2020 CLINICAL DATA:  Shortness of breath EXAM: PORTABLE CHEST 1 VIEW COMPARISON:  02/09/2020 FINDINGS: Stable cardiomegaly. Atherosclerotic calcification of the aorta and arch vessels. Mild pulmonary vascular  congestion with mildly prominent interstitial markings bilaterally. No focal consolidation. No pleural effusion or pneumothorax. IMPRESSION: Findings suggestive of CHF with mild interstitial edema. Electronically Signed   By: Davina Poke D.O.   On: 02/17/2020 13:10   DG Chest Port 1 View  Result Date: 02/08/2020 CLINICAL DATA:  V-tach EXAM: PORTABLE CHEST 1 VIEW COMPARISON:  February 03, 2020 FINDINGS: Again noted is mild cardiomegaly. Aortic knob calcifications. There is prominence of the central pulmonary vasculature. No large airspace consolidation or pleural effusion. There is stable elevation of the left hemidiaphragm. IMPRESSION: Mild cardiomegaly and pulmonary vascular congestion. Electronically Signed   By: Prudencio Pair M.D.   On: 02/08/2020 22:30   DG Chest Port 1 View  Result Date: 01/25/2020 CLINICAL DATA:  Shortness of breath, end-stage renal disease, hepatitis, cirrhosis EXAM: PORTABLE CHEST 1 VIEW COMPARISON:  01/20/2020 FINDINGS: Single frontal view of the chest demonstrates persistent enlargement of the cardiac silhouette. Diffuse increased interstitial prominence since prior study likely reflects interstitial edema. No airspace disease, effusion, or pneumothorax. No acute bony abnormalities. IMPRESSION: 1. Increased interstitial prominence compatible with interstitial edema. Electronically Signed   By: Randa Ngo M.D.   On: 01/25/2020 22:52   ECHOCARDIOGRAM LIMITED  Result Date: 02/05/2020    ECHOCARDIOGRAM LIMITED REPORT   Patient Name:   MYKELL RAWL Date of Exam: 02/05/2020 Medical Rec #:  [id number]              Height:       69.0 in Accession #:    [id number]             Weight:       155.0 lb Date of Birth:  Dec 29, 1960               BSA:          1.854 m Patient Age:    83 years               BP:           101/74 mmHg Patient Gender: M                      HR:           77 bpm. Exam Location:  Inpatient Procedure: 2D Echo Indications:    Shortness of breath  History:         Patient has prior history of Echocardiogram examinations.  Sonographer:    Raquel Sarna Senior Referring Phys: [place]  1. Left ventricular ejection fraction, by estimation, is 50 to 55%. The left ventricle has  normal function. There is severe left ventricular hypertrophy. Left ventricular diastolic function could not be evaluated. I cannot exclude septal hypokinesis. Endocardium not well visualized. septal motion also is consistent with LBBB.  2. Right ventricular systolic function is mildly reduced. The right ventricular size is moderately enlarged. Severely increased right ventricular wall thickness. There is moderately elevated pulmonary artery systolic pressure. The estimated right ventricular systolic pressure is 52.8 mmHg.  3. Left atrial size was severely dilated.  4. Right atrial size was severely dilated.  5. The mitral valve is normal in structure. Mild mitral valve regurgitation.  6. Dilated TV annulus. Tricuspid valve regurgitation is moderate to severe.  7. AV sclerosis without stenosis. The aortic valve is tricuspid. Aortic valve regurgitation is trivial. Mild aortic valve sclerosis is present, with no evidence of aortic valve stenosis. Aortic valve mean gradient measures 8.3 mmHg.  8. The inferior vena cava is dilated in size with <50% respiratory variability, suggesting right atrial pressure of 15 mmHg. FINDINGS  Left Ventricle: Left ventricular ejection fraction, by estimation, is 50 to 55%. The left ventricle has normal function. There is severe left ventricular hypertrophy. Abnormal (paradoxical) septal motion, consistent with left bundle branch block.  LV Wall Scoring: I cannot exclude septal hypokinesis. Endocardium not well visualized. septal motion also is consistent with LBBB. Right Ventricle: The right ventricular size is moderately enlarged. Severely increased right ventricular wall thickness. Right ventricular systolic function is mildly reduced. There is moderately  elevated pulmonary artery systolic pressure. The tricuspid  regurgitant velocity is 3.34 m/s, and with an assumed right atrial pressure of 15 mmHg, the estimated right ventricular systolic pressure is 41.3 mmHg. Left Atrium: Left atrial size was severely dilated. Right Atrium: Right atrial size was severely dilated. Pericardium: Trivial pericardial effusion is present. Mitral Valve: The mitral valve is normal in structure. Mild mitral valve regurgitation. MV peak gradient, 20.1 mmHg. The mean mitral valve gradient is 6.0 mmHg. Tricuspid Valve: Dilated TV annulus. The tricuspid valve is normal in structure. Tricuspid valve regurgitation is moderate to severe. Aortic Valve: AV sclerosis without stenosis. The aortic valve is tricuspid. Aortic valve regurgitation is trivial. Mild aortic valve sclerosis is present, with no evidence of aortic valve stenosis. There is mild calcification of the aortic valve. Aortic valve mean gradient measures 8.3 mmHg. Aortic valve peak gradient measures 14.0 mmHg. Aortic valve area, by VTI measures 1.59 cm. Pulmonic Valve: The pulmonic valve was normal in structure. Pulmonic valve regurgitation is trivial. Aorta: The aortic root is normal in size and structure. Pulmonary Artery: The pulmonary artery is not well seen. Venous: The inferior vena cava is dilated in size with less than 50% respiratory variability, suggesting right atrial pressure of 15 mmHg. IAS/Shunts: No atrial level shunt detected by color flow Doppler. Additional Comments: There is no pleural effusion. LEFT VENTRICLE PLAX 2D LVOT diam:     2.00 cm LV SV:         62 LV SV Index:   33 LVOT Area:     3.14 cm  AORTIC VALVE AV Area (Vmax):    1.46 cm AV Area (Vmean):   1.60 cm AV Area (VTI):     1.59 cm AV Vmax:           187.00 cm/s AV Vmean:          132.000 cm/s AV VTI:            0.389 m AV Peak Grad:      14.0 mmHg AV Mean Grad:  8.3 mmHg LVOT Vmax:         86.80 cm/s LVOT Vmean:        67.100 cm/s LVOT VTI:           0.197 m LVOT/AV VTI ratio: 0.51 MITRAL VALVE              TRICUSPID VALVE MV Area (PHT): 1.22 cm   TR Peak grad:   44.6 mmHg MV Peak grad:  20.1 mmHg  TR Vmax:        334.00 cm/s MV Mean grad:  6.0 mmHg MV Vmax:       2.24 m/s   SHUNTS MV Vmean:      103.0 cm/s Systemic VTI:  0.20 m                           Systemic Diam: 2.00 cm Adrian Prows MD Electronically signed by Adrian Prows MD Signature Date/Time: 02/05/2020/10:08:07 AM    Final    IR Paracentesis  Result Date: 02/07/2020 INDICATION: End-stage renal disease, cirrhosis, hepatitis B and C, recurrent ascites. Request for therapeutic paracentesis. EXAM: ULTRASOUND GUIDED PARACENTESIS MEDICATIONS: 1% lidocaine 10 mL COMPLICATIONS: None immediate. PROCEDURE: Informed written consent was obtained from the patient after a discussion of the risks, benefits and alternatives to treatment. A timeout was performed prior to the initiation of the procedure. Initial ultrasound scanning demonstrates a moderate amount of ascites within the left lateral abdomen. The left lateral abdomen was prepped and draped in the usual sterile fashion. 1% lidocaine was used for local anesthesia. Following this, a 19 gauge, 7-cm, Yueh catheter was introduced. An ultrasound image was saved for documentation purposes. The paracentesis was performed. The catheter was removed and a dressing was applied. The patient tolerated the procedure well without immediate post procedural complication. FINDINGS: A total of approximately 780 mL of clear yellow fluid was removed. IMPRESSION: Successful ultrasound-guided paracentesis yielding 780 mL of peritoneal fluid. Read by: Gareth Eagle, PA-C Electronically Signed   By: Corrie Mckusick D.O.   On: 02/07/2020 13:58   IR Paracentesis  Result Date: 01/31/2020 INDICATION: Patient with cirrhosis, recurrent ascites. Request made for therapeutic paracentesis. EXAM: ULTRASOUND GUIDED THERAPEUTIC PARACENTESIS MEDICATIONS: 10 ML 1% LIDOCAINE COMPLICATIONS: None  immediate. PROCEDURE: Informed written consent was obtained from the patient after a discussion of the risks, benefits and alternatives to treatment. A timeout was performed prior to the initiation of the procedure. Initial ultrasound scanning demonstrates a small amount of ascites within the left lateral abdomen. The left lateral abdomen was prepped and draped in the usual sterile fashion. 1% lidocaine was used for local anesthesia. Following this, a 6 Fr Safe-T-Centesis catheter was introduced. An ultrasound image was saved for documentation purposes. The paracentesis was performed. The catheter was removed and a dressing was applied. The patient tolerated the procedure well without immediate post procedural complication. FINDINGS: A total of approximately 5.0 liters of yellow fluid was removed. Samples were sent to the laboratory as requested by the clinical team. IMPRESSION: Successful ultrasound-guided therapeutic paracentesis yielding 5.0 liters of peritoneal fluid. Read by: Brynda Greathouse PA-C Electronically Signed   By: Corrie Mckusick D.O.   On: 01/31/2020 14:45   IR Paracentesis  Result Date: 01/24/2020 INDICATION: End-stage renal disease, cirrhosis, hepatitis B and C, recurrent ascites. Request for therapeutic paracentesis EXAM: ULTRASOUND GUIDED PARACENTESIS MEDICATIONS: 1% lidocaine 15 mL COMPLICATIONS: None immediate. PROCEDURE: Informed written consent was obtained from the patient after a discussion of the risks,  benefits and alternatives to treatment. A timeout was performed prior to the initiation of the procedure. Initial ultrasound scanning demonstrates a moderate amount of ascites within the left lower abdominal quadrant. The left lower abdomen was prepped and draped in the usual sterile fashion. 1% lidocaine was used for local anesthesia. Following this, a 6 Fr Safe-T-Centesis catheter was introduced. An ultrasound image was saved for documentation purposes. The paracentesis was performed.  The catheter was removed and a dressing was applied. The patient tolerated the procedure well without immediate post procedural complication. FINDINGS: A total of approximately 1.4 L of amber fluid was removed. IMPRESSION: Successful ultrasound-guided paracentesis yielding 1.4 liters of peritoneal fluid. Read by: Gareth Eagle, PA-C Electronically Signed   By: Aletta Edouard M.D.   On: 01/24/2020 14:49     Discharge Exam: Vitals:   02/19/20 0506 02/19/20 0745  BP: 114/63 123/66  Pulse: 97 98  Resp: 20 16  Temp: 98.9 F (37.2 C) 98.5 F (36.9 C)  SpO2: 95% 95%   Vitals:   02/19/20 0407 02/19/20 0506 02/19/20 0732 02/19/20 0745  BP: 112/60 114/63  123/66  Pulse: 92 97  98  Resp: 20 20  16   Temp: 98.9 F (37.2 C) 98.9 F (37.2 C)  98.5 F (36.9 C)  TempSrc: Oral Oral  Oral  SpO2: 94% 95%  95%  Weight: 65.4 kg  64.9 kg   Height:        General: Pt is alert, awake, not in acute distress Cardiovascular: RRR, S1/S2 +, no rubs, no gallops Respiratory: CTA bilaterally, no wheezing, no rhonchi Abdominal: Soft, NT, mild to moderate distention due to ascites/positive fluid shift, bowel sounds + Extremities: no edema, no cyanosis    The results of significant diagnostics from this hospitalization (including imaging, microbiology, ancillary and laboratory) are listed below for reference.     Microbiology: Recent Results (from the past 240 hour(s))  SARS Coronavirus 2 by RT PCR (hospital order, performed in [facility] hospital lab) Nasopharyngeal Nasopharyngeal Swab     Status: None   Collection Time: 02/09/20  3:20 PM   Specimen: Nasopharyngeal Swab  Result Value Ref Range Status   SARS Coronavirus 2 NEGATIVE NEGATIVE Final    Comment: (NOTE) SARS-CoV-2 target nucleic acids are NOT DETECTED.  The SARS-CoV-2 RNA is generally detectable in upper and lower respiratory specimens during the acute phase of infection. The lowest concentration of SARS-CoV-2 viral copies this assay can  detect is 250 copies / mL. A negative result does not preclude SARS-CoV-2 infection and should not be used as the sole basis for treatment or other patient management decisions.  A negative result may occur with improper specimen collection / handling, submission of specimen other than nasopharyngeal swab, presence of viral mutation(s) within the areas targeted by this assay, and inadequate number of viral copies (<250 copies / mL). A negative result must be combined with clinical observations, patient history, and epidemiological information.  Fact Sheet for Patients:   StrictlyIdeas.no  Fact Sheet for Healthcare Providers: BankingDealers.co.za  This test is not yet approved or  cleared by the [place] FDA and has been authorized for detection and/or diagnosis of SARS-CoV-2 by FDA under an Emergency Use Authorization (EUA).  This EUA will remain in effect (meaning this test can be used) for the duration of the COVID-19 declaration under Section 564(b)(1) of the Act, 21 U.S.C. section 360bbb-3(b)(1), unless the authorization is terminated or revoked sooner.  Performed at [facility], [place] [address],  [place], [place] [address]   SARS Coronavirus 2 by RT PCR (hospital order, performed in [facility] hospital lab) Nasopharyngeal Nasopharyngeal Swab     Status: None   Collection Time: 02/17/20 12:08 PM   Specimen: Nasopharyngeal Swab  Result Value Ref Range Status   SARS Coronavirus 2 NEGATIVE NEGATIVE Final    Comment: (NOTE) SARS-CoV-2 target nucleic acids are NOT DETECTED.  The SARS-CoV-2 RNA is generally detectable in upper and lower respiratory specimens during the acute phase of infection. The lowest concentration of SARS-CoV-2 viral copies this assay can detect is 250 copies / mL. A negative result does not preclude SARS-CoV-2 infection and should not be used as the sole basis for treatment or other patient  management decisions.  A negative result may occur with improper specimen collection / handling, submission of specimen other than nasopharyngeal swab, presence of viral mutation(s) within the areas targeted by this assay, and inadequate number of viral copies (<250 copies / mL). A negative result must be combined with clinical observations, patient history, and epidemiological information.  Fact Sheet for Patients:   StrictlyIdeas.no  Fact Sheet for Healthcare Providers: BankingDealers.co.za  This test is not yet approved or  cleared by the [place] FDA and has been authorized for detection and/or diagnosis of SARS-CoV-2 by FDA under an Emergency Use Authorization (EUA).  This EUA will remain in effect (meaning this test can be used) for the duration of the COVID-19 declaration under Section 564(b)(1) of the Act, 21 U.S.C. section 360bbb-3(b)(1), unless the authorization is terminated or revoked sooner.  Performed at [facility], [place] [address] [place], [place] [address]      Labs: BNP (last 3 results) Recent Labs    11/12/19 2341 01/14/20 1927 02/17/20 1208  BNP 3,672.9* 2,254.1* 4,580.9*   Basic Metabolic Panel: Recent Labs  Lab 02/13/20 0424 02/17/20 1208 02/18/20 0002 02/18/20 0215  NA 134* 135 131* 136  K 4.2 6.7* 6.5* 3.7  CL 90* 92* 90* 96*  CO2 28 28 26 28   GLUCOSE 134* 105* 108* 109*  BUN 26* 40* 49* 16  CREATININE 5.02* 5.89* 6.37* 2.90*  CALCIUM 9.5 9.6 9.8 9.2  MG 2.2 2.2  --   --   PHOS  --  4.1 4.7*  --    Liver Function Tests: Recent Labs  Lab 02/13/20 0424 02/17/20 1208 02/18/20 0002 02/18/20 0215  AST 47* 26  --  26  ALT 82* 34  --  32  ALKPHOS 239* 272*  --  276*  BILITOT 1.3* 1.2  --  0.8  PROT 6.3* 6.3*  --  6.3*  ALBUMIN 2.5* 2.4* 2.3* 2.4*   No results for input(s): LIPASE, AMYLASE in the last 168 hours. No results for input(s): AMMONIA in the last 168  hours. CBC: Recent Labs  Lab 02/13/20 0424 02/17/20 1208 02/18/20 0001  WBC 13.0* 12.1* 10.8*  NEUTROABS 9.9* 9.6*  --   HGB 10.0* 9.6* 9.1*  HCT 29.0* 28.8* 27.0*  MCV 80.8 80.2 80.1  PLT 135* 160 180   Cardiac Enzymes: No results for input(s): CKTOTAL, CKMB, CKMBINDEX, TROPONINI in the last 168 hours. BNP: Invalid input(s): POCBNP CBG: Recent Labs  Lab 02/17/20 1446 02/17/20 1638  GLUCAP 82 84   D-Dimer No results for input(s): DDIMER in the last 72 hours. Hgb A1c No results for input(s): HGBA1C in the last 72 hours. Lipid Profile Recent Labs    02/18/20 0001  CHOL 140  HDL 38*  LDLCALC 81  TRIG  107  CHOLHDL 3.7   Thyroid function studies No results for input(s): TSH, T4TOTAL, T3FREE, THYROIDAB in the last 72 hours.  Invalid input(s): FREET3 Anemia work up No results for input(s): VITAMINB12, FOLATE, FERRITIN, TIBC, IRON, RETICCTPCT in the last 72 hours. Urinalysis    Component Value Date/Time   COLORURINE YELLOW 07/16/2011 1619   APPEARANCEUR CLEAR 07/16/2011 1619   LABSPEC 1.018 07/16/2011 1619   PHURINE 7.5 07/16/2011 1619   GLUCOSEU 250 (A) 07/16/2011 1619   HGBUR MODERATE (A) 07/16/2011 1619   BILIRUBINUR SMALL (A) 07/16/2011 1619   KETONESUR NEGATIVE 07/16/2011 1619   PROTEINUR >300 (A) 07/16/2011 1619   UROBILINOGEN 1.0 07/16/2011 1619   NITRITE NEGATIVE 07/16/2011 1619   LEUKOCYTESUR SMALL (A) 07/16/2011 1619   Sepsis Labs Invalid input(s): PROCALCITONIN,  WBC,  LACTICIDVEN Microbiology Recent Results (from the past 240 hour(s))  SARS Coronavirus 2 by RT PCR (hospital order, performed in [place] hospital lab) Nasopharyngeal Nasopharyngeal Swab     Status: None   Collection Time: 02/09/20  3:20 PM   Specimen: Nasopharyngeal Swab  Result Value Ref Range Status   SARS Coronavirus 2 NEGATIVE NEGATIVE Final    Comment: (NOTE) SARS-CoV-2 target nucleic acids are NOT DETECTED.  The SARS-CoV-2 RNA is generally detectable in upper and  lower respiratory specimens during the acute phase of infection. The lowest concentration of SARS-CoV-2 viral copies this assay can detect is 250 copies / mL. A negative result does not preclude SARS-CoV-2 infection and should not be used as the sole basis for treatment or other patient management decisions.  A negative result may occur with improper specimen collection / handling, submission of specimen other than nasopharyngeal swab, presence of viral mutation(s) within the areas targeted by this assay, and inadequate number of viral copies (<250 copies / mL). A negative result must be combined with clinical observations, patient history, and epidemiological information.  Fact Sheet for Patients:   StrictlyIdeas.no  Fact Sheet for Healthcare Providers: BankingDealers.co.za  This test is not yet approved or  cleared by the [place] FDA and has been authorized for detection and/or diagnosis of SARS-CoV-2 by FDA under an Emergency Use Authorization (EUA).  This EUA will remain in effect (meaning this test can be used) for the duration of the COVID-19 declaration under Section 564(b)(1) of the Act, 21 U.S.C. section 360bbb-3(b)(1), unless the authorization is terminated or revoked sooner.  Performed at [facility], [place] [address] [place], [place] [address]   SARS Coronavirus 2 by RT PCR (hospital order, performed in [facility] hospital lab) Nasopharyngeal Nasopharyngeal Swab     Status: None   Collection Time: 02/17/20 12:08 PM   Specimen: Nasopharyngeal Swab  Result Value Ref Range Status   SARS Coronavirus 2 NEGATIVE NEGATIVE Final    Comment: (NOTE) SARS-CoV-2 target nucleic acids are NOT DETECTED.  The SARS-CoV-2 RNA is generally detectable in upper and lower respiratory specimens during the acute phase of infection. The lowest concentration of SARS-CoV-2 viral copies this assay can detect is 250 copies / mL. A  negative result does not preclude SARS-CoV-2 infection and should not be used as the sole basis for treatment or other patient management decisions.  A negative result may occur with improper specimen collection / handling, submission of specimen other than nasopharyngeal swab, presence of viral mutation(s) within the areas targeted by this assay, and inadequate number of viral copies (<250 copies / mL). A negative result must be combined with clinical observations, patient history, and epidemiological  information.  Fact Sheet for Patients:   StrictlyIdeas.no  Fact Sheet for Healthcare Providers: BankingDealers.co.za  This test is not yet approved or  cleared by the [place] FDA and has been authorized for detection and/or diagnosis of SARS-CoV-2 by FDA under an Emergency Use Authorization (EUA).  This EUA will remain in effect (meaning this test can be used) for the duration of the COVID-19 declaration under Section 564(b)(1) of the Act, 21 U.S.C. section 360bbb-3(b)(1), unless the authorization is terminated or revoked sooner.  Performed at [facility], [place] [address], [place],  [address]      Time coordinating discharge: Over 30 minutes  SIGNED:   Darliss Cheney, MD  [facility] 02/19/2020, 9:17 AM  If 7PM-7AM, please contact night-coverage [email or web address]

## 2020-02-19 NOTE — Progress Notes (Signed)
[facility] Progress Note   Subjective:  Seen in room - feels well today, he is hoping for discharge. He disclosed to Dr. Einar Gip that he has been drinking several Ensure daily as well as [employer] (fries) and Fruit juices - potassium load too high, has been counseled to avoid. Will call the dietician at his HD unit to follow up on this with him.  Objective Vitals:   02/19/20 0407 02/19/20 0506 02/19/20 0732 02/19/20 0745  BP: 112/60 114/63  123/66  Pulse: 92 97  98  Resp: 20 20  16   Temp: 98.9 F (37.2 C) 98.9 F (37.2 C)  98.5 F (36.9 C)  TempSrc: Oral Oral  Oral  SpO2: 94% 95%  95%  Weight: 65.4 kg  64.9 kg   Height:       Physical Exam General: Chronically ill appearing man, NAD Heart: Irregularly irregular, no murmur Lungs: CTAB Abdomen: Distended with ascites but soft and non-tender Extremities: No LE edema Dialysis Access:  R AVG + bruit  Additional Objective Labs: Basic Metabolic Panel: Recent Labs  Lab 02/17/20 1208 02/18/20 0002 02/18/20 0215  NA 135 131* 136  K 6.7* 6.5* 3.7  CL 92* 90* 96*  CO2 28 26 28   GLUCOSE 105* 108* 109*  BUN 40* 49* 16  CREATININE 5.89* 6.37* 2.90*  CALCIUM 9.6 9.8 9.2  PHOS 4.1 4.7*  --    Liver Function Tests: Recent Labs  Lab 02/13/20 0424 02/13/20 0424 02/17/20 1208 02/18/20 0002 02/18/20 0215  AST 47*  --  26  --  26  ALT 82*  --  34  --  32  ALKPHOS 239*  --  272*  --  276*  BILITOT 1.3*  --  1.2  --  0.8  PROT 6.3*  --  6.3*  --  6.3*  ALBUMIN 2.5*   < > 2.4* 2.3* 2.4*   < > = values in this interval not displayed.   CBC: Recent Labs  Lab 02/13/20 0424 02/17/20 1208 02/18/20 0001  WBC 13.0* 12.1* 10.8*  NEUTROABS 9.9* 9.6*  --   HGB 10.0* 9.6* 9.1*  HCT 29.0* 28.8* 27.0*  MCV 80.8 80.2 80.1  PLT 135* 160 180   Studies/Results: DG Chest Portable 1 View  Result Date: 02/17/2020 CLINICAL DATA:  Shortness of breath EXAM: PORTABLE CHEST 1 VIEW COMPARISON:  02/09/2020 FINDINGS: Stable  cardiomegaly. Atherosclerotic calcification of the aorta and arch vessels. Mild pulmonary vascular congestion with mildly prominent interstitial markings bilaterally. No focal consolidation. No pleural effusion or pneumothorax. IMPRESSION: Findings suggestive of CHF with mild interstitial edema. Electronically Signed   By: Davina Poke D.O.   On: 02/17/2020 13:10   Medications:  . (feeding supplement) PROSource Plus  30 mL Oral BID BM  . calcitRIOL  0.75 mcg Oral Q M,W,F-HD  . darbepoetin (ARANESP) injection - DIALYSIS  40 mcg Intravenous Q Mon-HD  . diltiazem  120 mg Oral Daily  . gabapentin  100 mg Oral TID  . metoprolol tartrate  12.5 mg Oral BID  . multivitamin  1 tablet Oral QHS  . pantoprazole  40 mg Oral BID  . sevelamer carbonate  3,200 mg Oral TID WC  . sodium zirconium cyclosilicate  10 g Oral Once per day on Tue Thu Sat  . tenofovir  300 mg Oral Q Mon    Dialysis Orders: [facility] M[facility] 4 hrs 180NRe 450/800 61 kg 2.0 K/2.0 Ca No heparin - Mircera 100 mcg IV q 2 weeks (last 01/09/2020 recently  restarted hasn't rec'd dose yet).  - Venofer 50 mg IV weekly (recently restarted, hasn't rec'd dose yet).  - Calcitriol 0.75 mcg PO TIW  Assessment/Plan: 1. Hyperkalemia: S/p urgent HD to correct after rec'd Lokelma, insulin, Ca+ gluc, D50W. Hyperkalemia has become a chronic issue, Lokelma added on non-HD days, has $0 co-pay per pharmacy. Diet also reviewed today - was not following low K regimen. 2. Afib RVR: Req'd Cardizem drip, now back to home meds - metoprolol + diltiazem. Not a candidate for anticoagulation. Per primary + cardiology. 3. Cirrhosis/ascites: He requires frequent LV paracentesis. Apparently has one sched for 8/12. 4. ESRD: Resuming home MWF schedule now - next 8/11. 5. Anemia: Hgb 9.1 - Aranesp given yesterday. Follow Hgb + transfuse as needed.  6. Secondary hyperparathyroidism: Ca/Phos ok. Continue binders, VDRA.  7. HTN/volume: Variable weights - can only meet true  dry weight after paracentesis. Follow.   8. Nutrition - Chronically low albumin.   Veneta Penton, PA-C 02/19/2020, 10:07 AM  [employer]

## 2020-02-20 ENCOUNTER — Telehealth: Payer: Self-pay | Admitting: Hospice

## 2020-02-20 NOTE — Telephone Encounter (Signed)
Scheduled In-person visit with patient did not hold as patient was not home. Patient was called before going to his home and also after arrival; left voicemails with call back number.

## 2020-02-21 ENCOUNTER — Ambulatory Visit (HOSPITAL_COMMUNITY)
Admission: RE | Admit: 2020-02-21 | Discharge: 2020-02-21 | Disposition: A | Discharge status: Home / Self Care | Payer: Medicare Other | Source: Ambulatory Visit | Attending: Gastroenterology | Admitting: Gastroenterology

## 2020-02-21 ENCOUNTER — Other Ambulatory Visit: Payer: Self-pay

## 2020-02-21 DIAGNOSIS — K7460 Unspecified cirrhosis of liver: Secondary | ICD-10-CM | POA: Diagnosis present

## 2020-02-21 DIAGNOSIS — R188 Other ascites: Secondary | ICD-10-CM | POA: Diagnosis not present

## 2020-02-21 HISTORY — PX: IR PARACENTESIS: IMG2679

## 2020-02-21 MED ORDER — LIDOCAINE HCL 1 % IJ SOLN
INTRAMUSCULAR | Status: AC
  Filled 2020-02-21: qty 20

## 2020-02-21 NOTE — Procedures (Signed)
PROCEDURE SUMMARY:  Successful US guided paracentesis from left lateral abdomen.  Yielded 2.8 L of amber fluid.  No immediate complications.  Pt tolerated well.   EBL < 37mL  Anab Vivar R Zan Triska, NP 02/21/2020 2:17 PM

## 2020-03-05 ENCOUNTER — Ambulatory Visit: Payer: Medicare Other | Admitting: Physician Assistant

## 2020-03-05 ENCOUNTER — Other Ambulatory Visit: Payer: Self-pay

## 2020-03-13 ENCOUNTER — Other Ambulatory Visit: Payer: Self-pay | Admitting: Internal Medicine

## 2020-03-13 ENCOUNTER — Other Ambulatory Visit: Payer: Self-pay

## 2020-03-13 ENCOUNTER — Ambulatory Visit: Payer: Medicare Other | Admitting: Cardiology

## 2020-03-13 ENCOUNTER — Ambulatory Visit (HOSPITAL_COMMUNITY)
Admission: RE | Admit: 2020-03-13 | Discharge: 2020-03-13 | Disposition: A | Discharge status: Home / Self Care | Payer: Medicare Other | Source: Ambulatory Visit | Attending: Internal Medicine | Admitting: Internal Medicine

## 2020-03-13 DIAGNOSIS — K7460 Unspecified cirrhosis of liver: Secondary | ICD-10-CM | POA: Diagnosis not present

## 2020-03-13 DIAGNOSIS — R188 Other ascites: Secondary | ICD-10-CM

## 2020-03-13 IMAGING — DX DG CHEST 1V PORT
1 series · 1 of 1 positions shown · non-contrast
Comparison: 03/10/2018

CLINICAL DATA: Shortness of breath.

EXAM:
PORTABLE CHEST 1 VIEW

[chest ap]
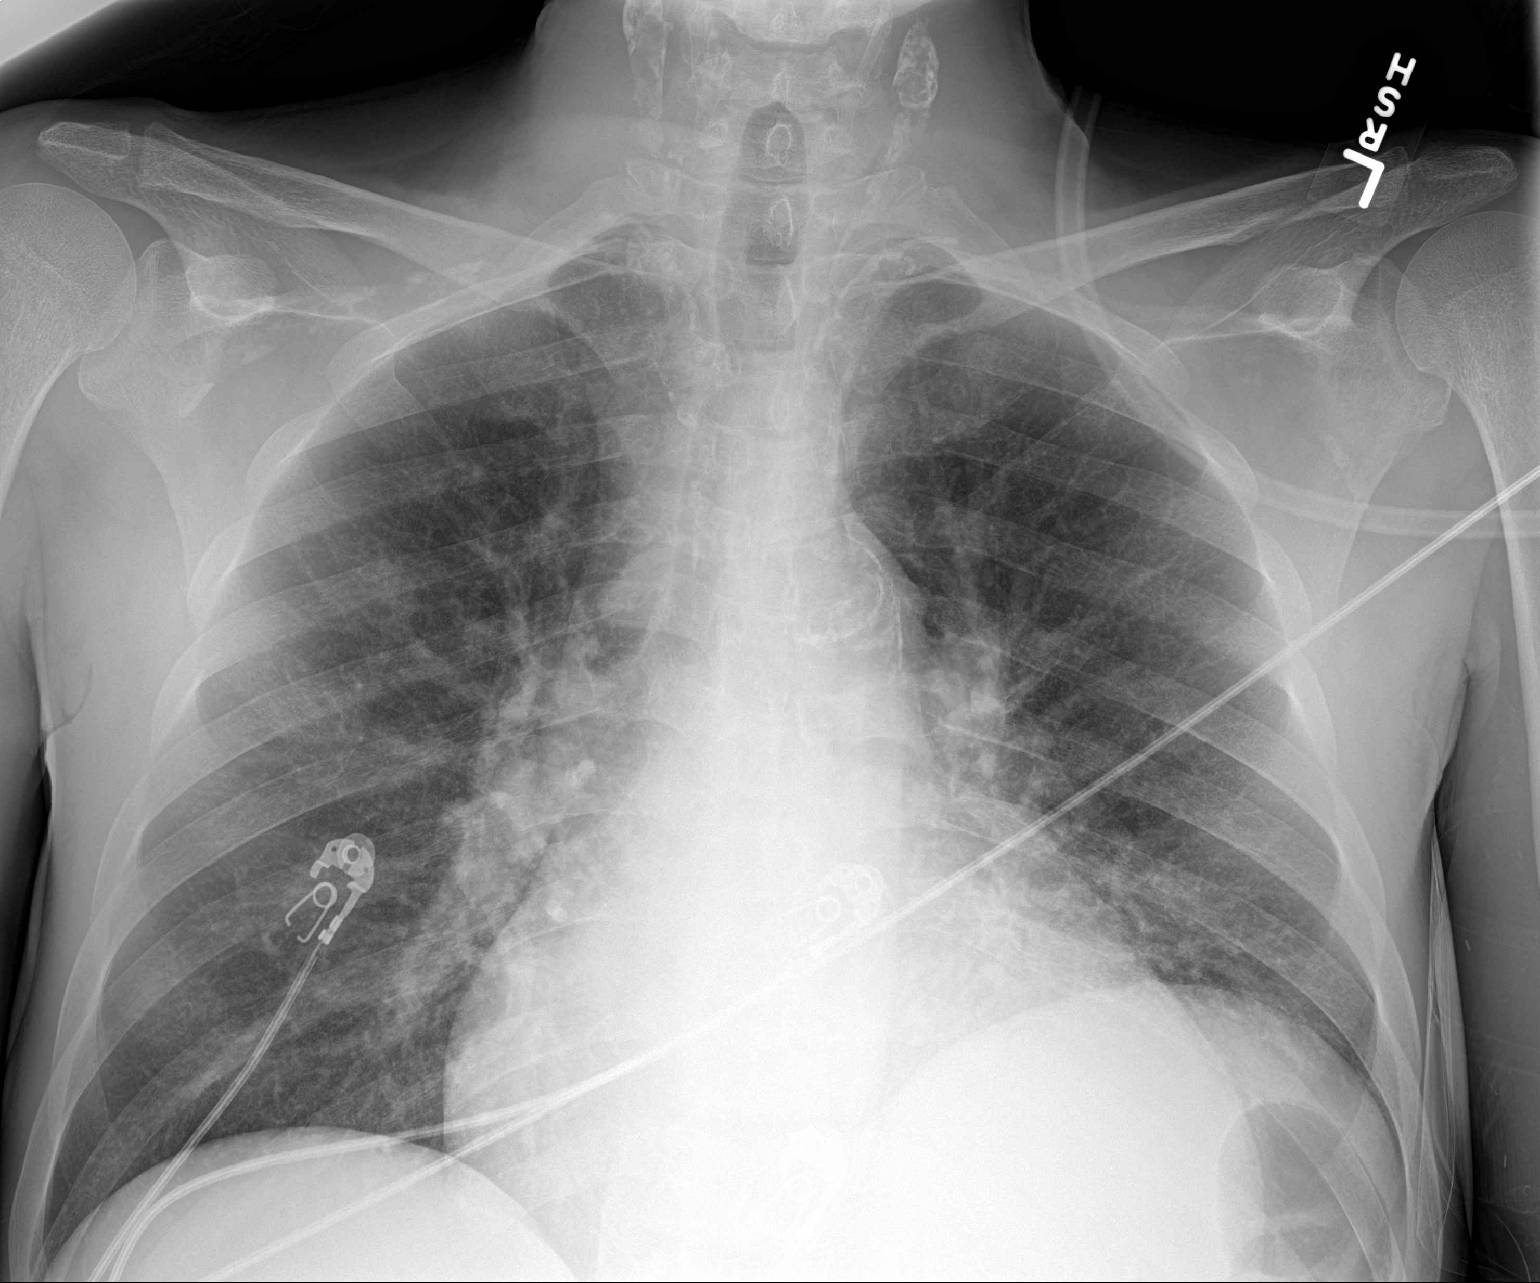

[1 of 1 positions shown; findings below may reference images not displayed]

FINDINGS: There is chronic cardiomegaly. Aortic atherosclerosis. Pulmonary
vascularity is at the upper limits of normal. Chronic slight
elevation of the left hemidiaphragm. No infiltrates or effusions. No
acute bone abnormality. Extensive calcification in the carotid
arteries.
IMPRESSION: No acute abnormalities. Chronic cardiomegaly with pulmonary
vascularity at the upper limits of normal.

Aortic Atherosclerosis ([id number]).

Extensive calcification in the carotid arteries.

## 2020-03-13 NOTE — Progress Notes (Signed)
Patient presented to IR today for paracentesis - limited abdominal US shows no peritoneal fluid which is amenable to percutaneous drainage.  No procedure performed today, patient discharged to home.  Candiss Norse, PA-C

## 2020-03-20 ENCOUNTER — Ambulatory Visit (HOSPITAL_COMMUNITY)
Admission: RE | Admit: 2020-03-20 | Discharge: 2020-03-20 | Disposition: A | Discharge status: Home / Self Care | Payer: Medicare Other | Source: Ambulatory Visit | Attending: Gastroenterology | Admitting: Gastroenterology

## 2020-03-20 ENCOUNTER — Other Ambulatory Visit: Payer: Self-pay

## 2020-03-20 ENCOUNTER — Other Ambulatory Visit: Payer: Self-pay | Admitting: Gastroenterology

## 2020-03-20 DIAGNOSIS — R188 Other ascites: Secondary | ICD-10-CM

## 2020-03-20 DIAGNOSIS — K7460 Unspecified cirrhosis of liver: Secondary | ICD-10-CM

## 2020-03-20 MED ORDER — DILTIAZEM HCL ER COATED BEADS 120 MG PO CP24: 120.0000 mg | ORAL_CAPSULE | Freq: Every day | ORAL | 0 refills | Status: DC

## 2020-03-20 MED ORDER — LIDOCAINE HCL 1 % IJ SOLN
INTRAMUSCULAR | Status: AC
  Filled 2020-03-20: qty 20

## 2020-03-20 MED ORDER — METOPROLOL TARTRATE 25 MG PO TABS: 12.5000 mg | ORAL_TABLET | Freq: Two times a day (BID) | ORAL | 3 refills | Status: DC

## 2020-03-20 NOTE — Progress Notes (Addendum)
Patient presented to IR for therapeutic paracentesis. No fluid identified on limited abdominal ultrasound amenable to percutaneous intervention. Patient states he feels good and does not feel like he has ascites. This is the second consecutive appointment in which the patient did not have any fluid for a paracentesis. No procedure performed today, patient discharged home.   Dr. Pascal Lux notified.   Soyla Dryer, [place] [phone] 03/20/2020, 1:30 PM

## 2020-03-21 IMAGING — CR CHEST - 2 VIEW
2 series · 2 of 2 positions shown · non-contrast
Comparison: 09/12/2018

CLINICAL DATA: Patient was diagnosed with influenza on 09/12/2018
and not improving. Flu-like symptoms.

EXAM:
CHEST - 2 VIEW

[chest lat]
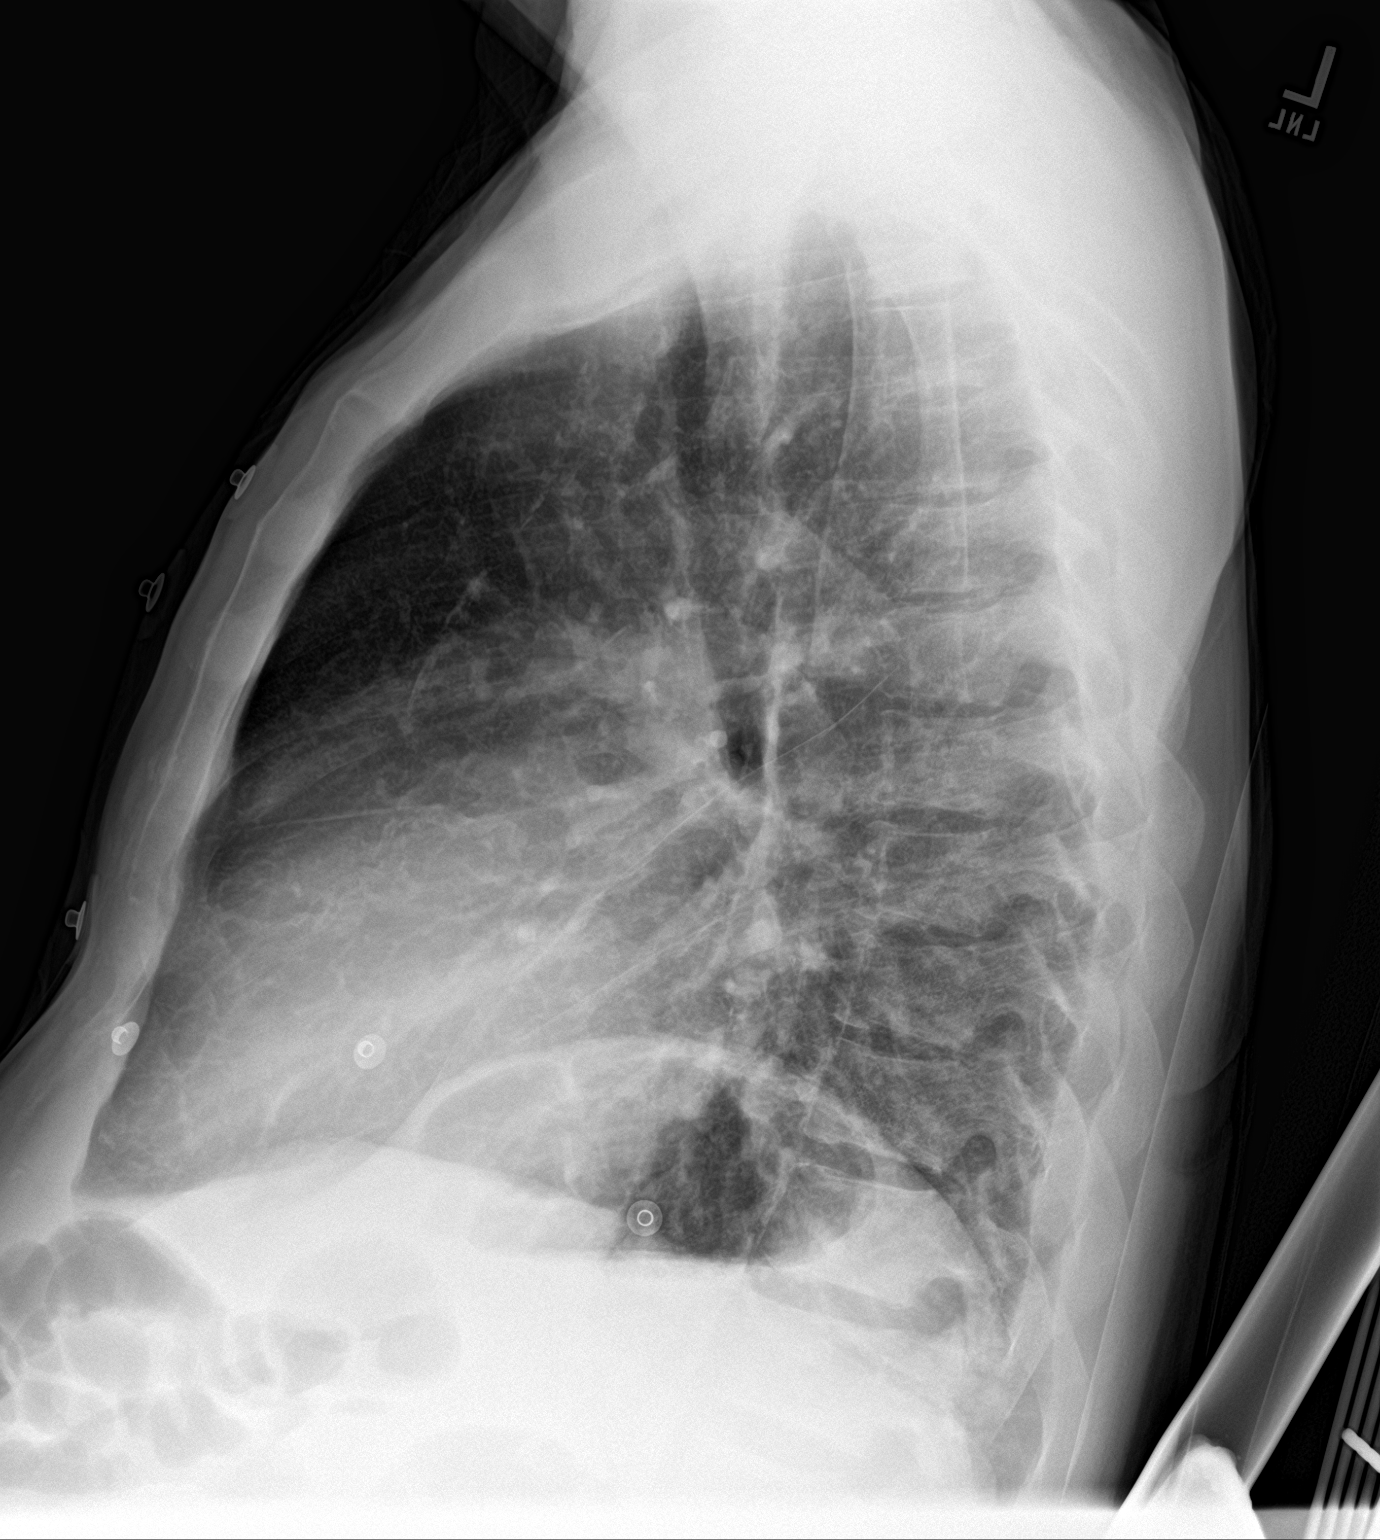

[chest ap]
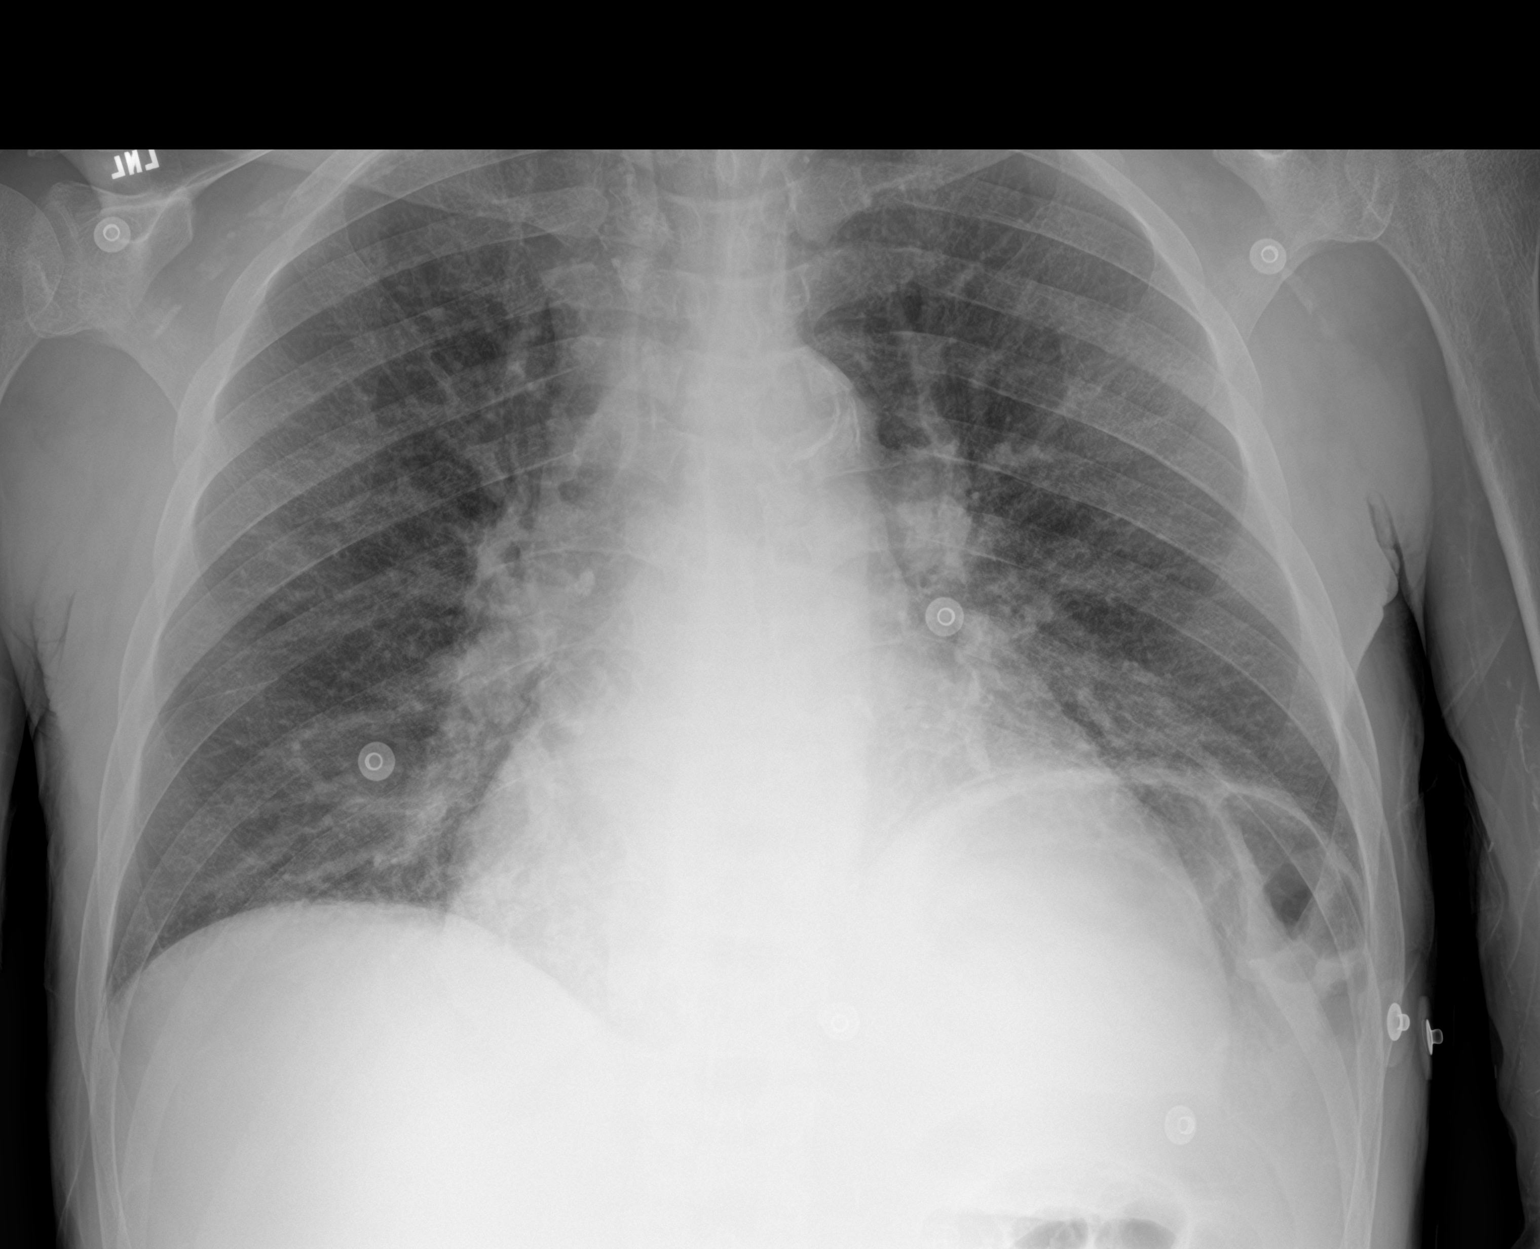

[2 of 2 positions shown; findings below may reference images not displayed]

FINDINGS: Shallow inspiration with elevation of left hemidiaphragm, chronic.
Cardiac enlargement. Increasing pulmonary vascularity with
suggestion of mild developing interstitial pattern to the lungs.
This may indicate developing edema. No focal consolidation. No
blunting of costophrenic angles. No pneumothorax. Vascular
calcifications.
IMPRESSION: Cardiac enlargement with developing pulmonary vascular congestion
and mild interstitial edema. No focal consolidation.

## 2020-03-22 IMAGING — DX RIGHT RIBS AND CHEST - 3+ VIEW
3 series · 4 of 4 positions shown · non-contrast
Comparison: 09/20/2018

CLINICAL DATA: Fell onto right side, pain

EXAM:
RIGHT RIBS AND CHEST - 3+ VIEW

[Series 5: chest ap · 0.14mm/px · 2 of 2 slices shown]
[im 1/2]
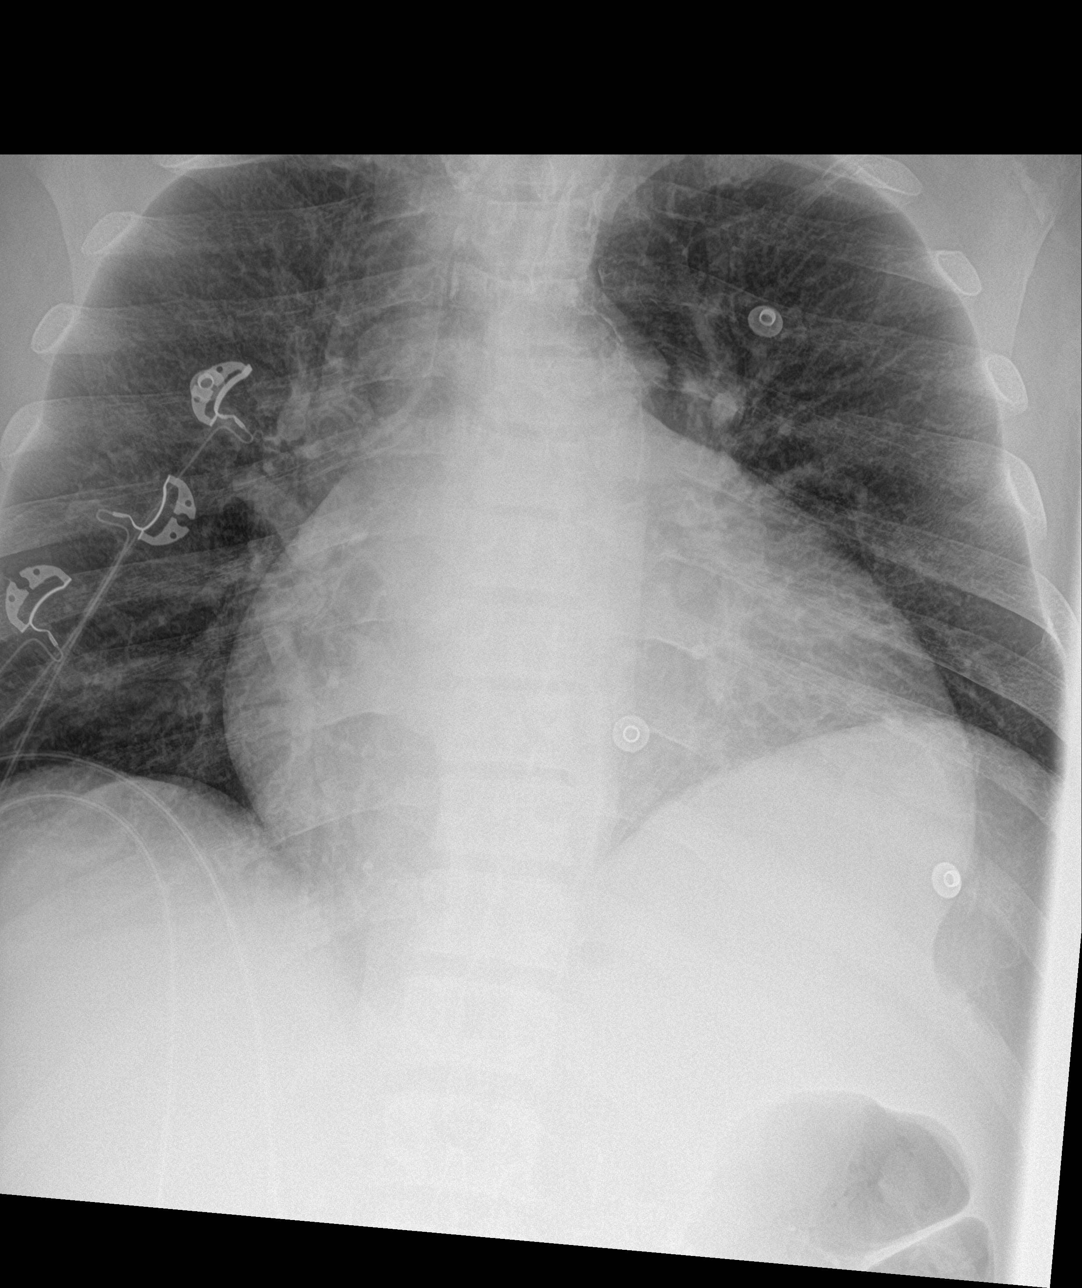
[im 2/2]
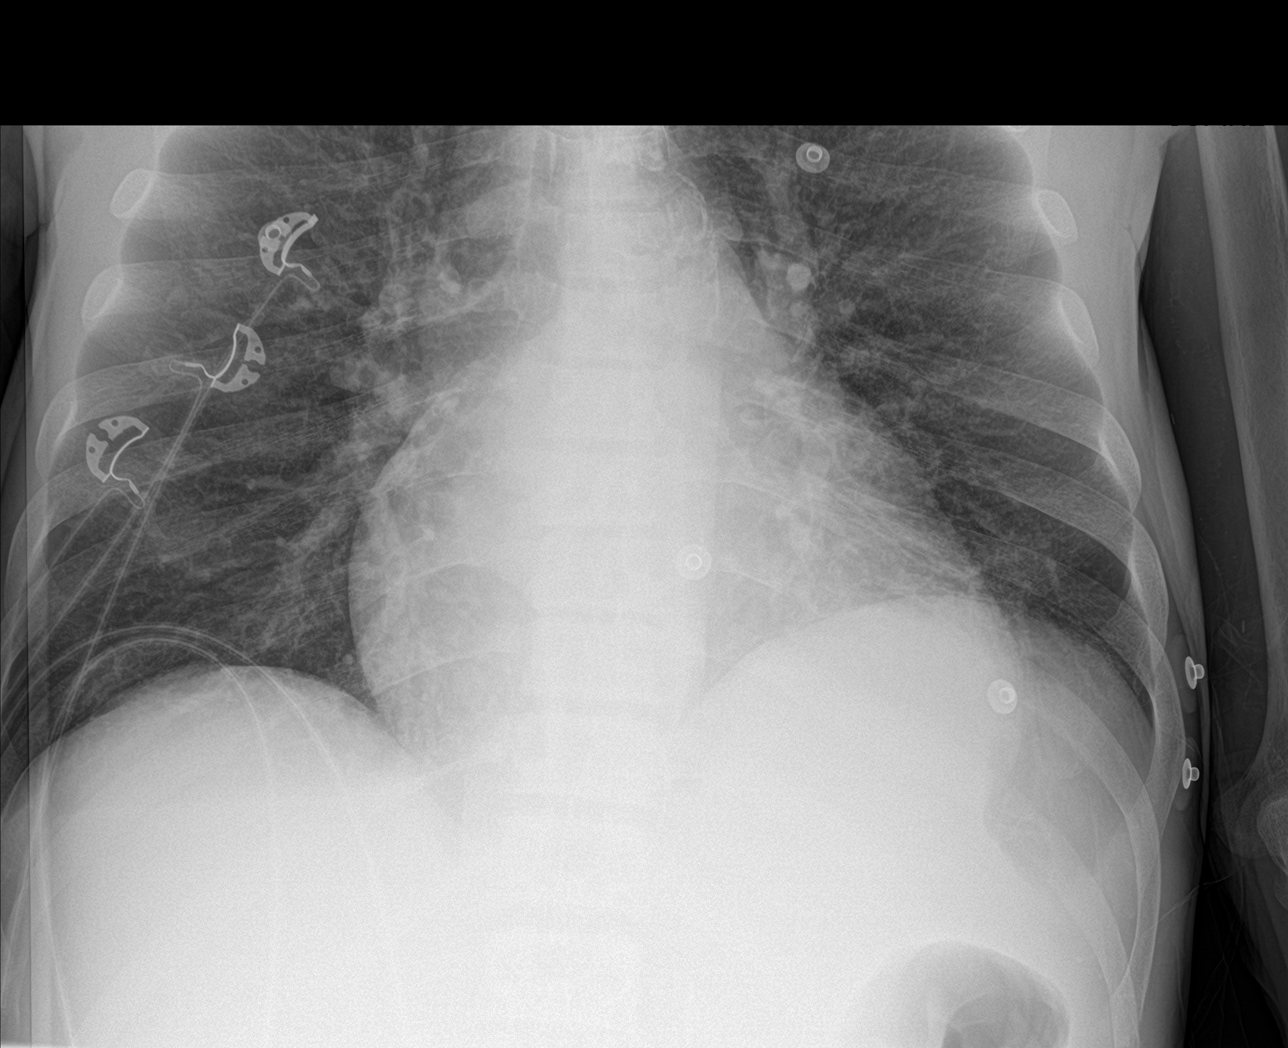

[rib ap]
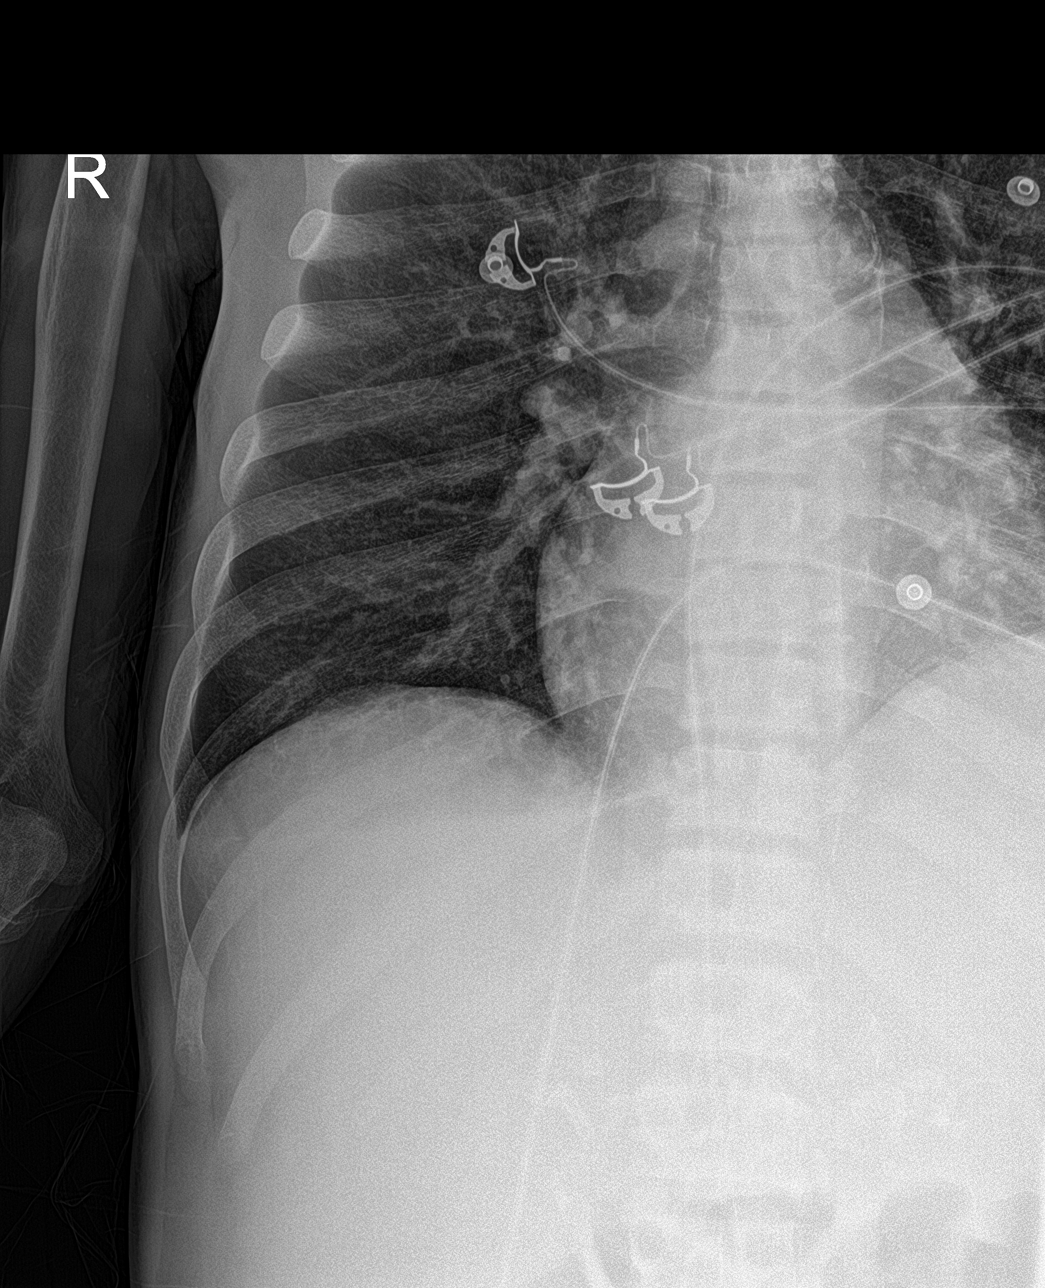

[rib ap obl]
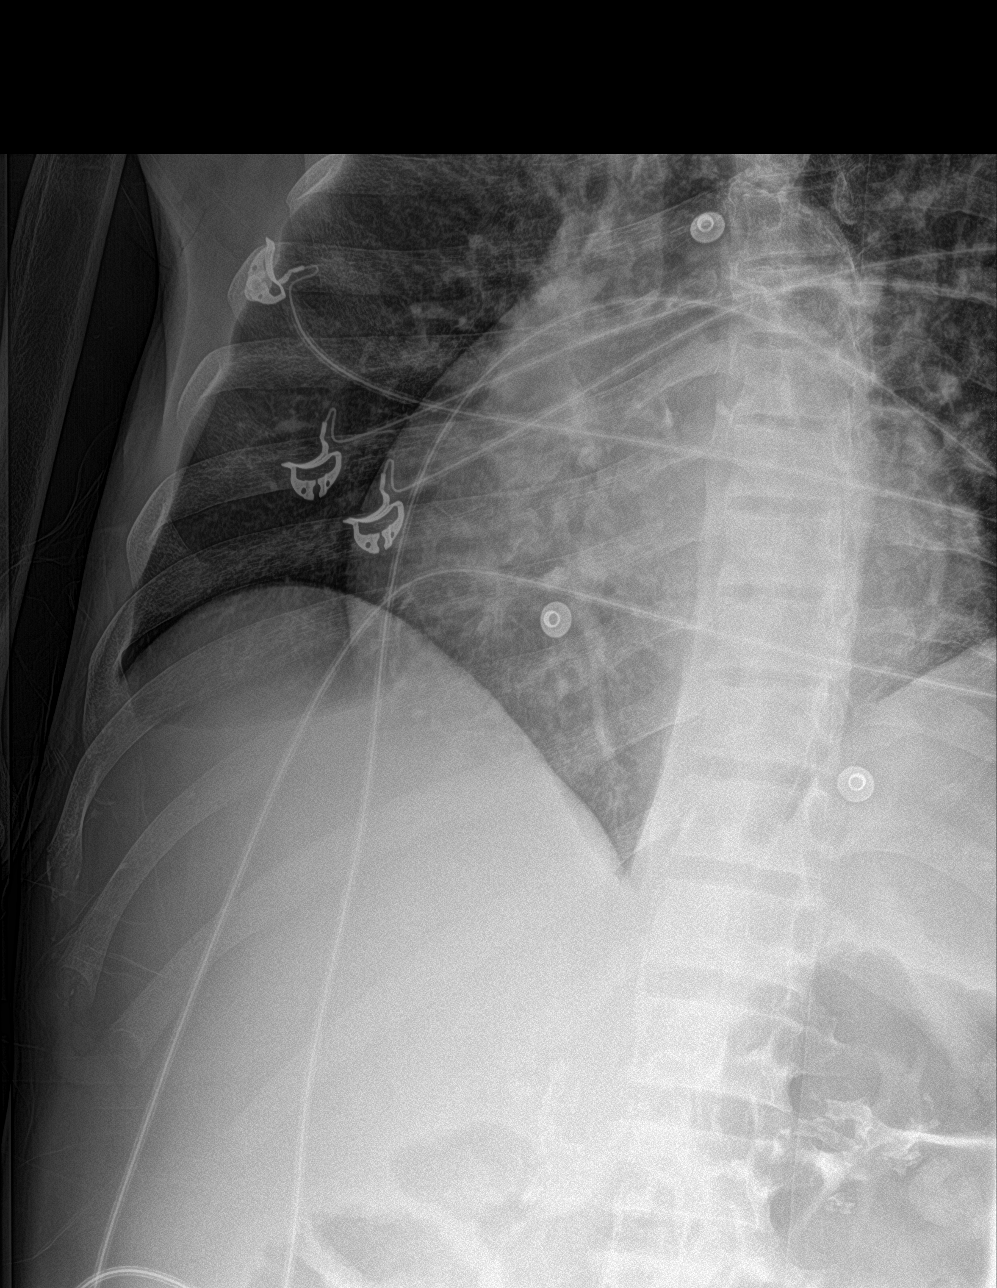

[4 of 4 positions shown; findings below may reference images not displayed]

FINDINGS: no displaced fracture. No pneumothorax or pleural effusion.

Cardiomegaly stable. Aortic Atherosclerosis ([id number]). Mild
interstitial prominence as before.
IMPRESSION: 1. No displaced right rib fracture or pneumothorax.
2. Stable cardiomegaly and interstitial prominence

## 2020-03-24 NOTE — Progress Notes (Signed)
Primary Physician/Referring:  Patient, No Pcp Per  Patient ID: [name], male    DOB: January 21, 1961, 59 y.o.   MRN: [id number]  Chief Complaint  Patient presents with  . Follow-up    3 week  . Atrial Fibrillation  . Congestive Heart Failure   HPI:    [name]  is a 59 y.o. male  with very complex medical problems including severe COPD and chronic cor pulmonale with pulmonary hypertension, coronary artery disease s/p stenting to mid RCA in 2018, paroxysmal atrial fibrillation with RVR, hypertensive cardiomyopathy, dietary and medication noncompliance, end-stage renal disease on hemodialysis, chronic hepatitis B and C, tobacco use, smokes 1/2 ppd, h/o marijuana use and prior cocaine use quit in 2017. Not on anticoagulation due to cirrhosis of liver and compliance with Warfarin.   He has had multiple ER visits for A fib with RVR, near syncope, shortness of breath, acute diastolic heart failure and hyperkalemia.  He has also been in the hospital with acute respiratory distress and pulmonary edema due to missed dialysis.  Last visit was on 02/07/2020, since then patient has had 2 hospital admissions for atrial fibrillation with RVR and shortness of breath secondary to missed dialysis.   Patient presents today for 3-week follow-up on atrial fibrillation and CHF following recent hospitalization from 02/17/2020-02/19/2020. At last office visit no changes were made to his cardiac medications, but he was complaining of throat pain and was given prescription for Magic mouthwash.  He reports today that he is taking his amiodarone and all medications as prescribed, however he did not bring medications or medication list with him today.  He is not currently experiencing symptoms of chest pain, palpitations, syncope, dizziness, orthopnea, PND, leg swelling.  Has chronic shortness of breath, reports it is stable at this time.  Of note his last paracentesis was 2 weeks ago according to  the patient, he has follow-up with interventional radiology in 2 days.  He also reports he has been compliant with Monday/Wednesday/Friday dialysis schedule.  He is requesting today that he get a refill of his Protonix and a letter stating he would benefit from a service dog.  He reports he has still not established care with a new primary care provider.   Past Medical History:  Diagnosis Date  . Anemia   . Anxiety   . Arthritis    left shoulder  . Atherosclerosis of aorta ([place])   . Cardiomegaly   . Chest pain    DATE UNKNOWN, C/O PERIODICALLY  . Cocaine abuse ([place])   . COPD exacerbation ([place]) 08/17/2016  . Coronary artery disease    stent 02/22/17  . ESRD (end stage renal disease) on dialysis ([place])    "[name]; MWF" (07/04/2017)  . GERD (gastroesophageal reflux disease)    DATE UNKNOWN  . Hemorrhoids   . Hepatitis B, chronic ([place])   . Hepatitis C   . History of kidney stones   . Hyperkalemia   . Hypertension   . Kidney failure   . Metabolic bone disease    Patient denies  . Mitral stenosis   . Myocardial infarction ([place])   . Pneumonia   . Pulmonary edema   . Solitary rectal ulcer syndrome 07/2017   at flex sig for rectal bleeding  . Tubular adenoma of colon    Past Surgical History:  Procedure Laterality Date  . A/V FISTULAGRAM Left 05/26/2017   Procedure: A/V FISTULAGRAM;  Surgeon: Conrad Orrville, MD;  Location: [place] [facility];  Service: Cardiovascular;  Laterality: Left;  . A/V FISTULAGRAM Right 11/18/2017   Procedure: A/V FISTULAGRAM - Right Arm;  Surgeon: Elam Dutch, MD;  Location: [place] [facility];  Service: Cardiovascular;  Laterality: Right;  . APPLICATION OF WOUND VAC Left 06/14/2017   Procedure: APPLICATION OF WOUND VAC;  Surgeon: Katha Cabal, MD;  Location: [facility];  Service: Vascular;  Laterality: Left;  . AV FISTULA PLACEMENT  2012   BELIEVED WAS PLACED IN JUNE  . AV FISTULA PLACEMENT Right 08/09/2017   Procedure: Creation Right arm  ARTERIOVENOUS BRACHIOCEPOHALIC FISTULA;  Surgeon: Elam Dutch, MD;  [facility]: [facility];  Service: Vascular;  Laterality: Right;  . AV FISTULA PLACEMENT Right 11/22/2017   Procedure: INSERTION OF ARTERIOVENOUS (AV) GORE-TEX GRAFT RIGHT UPPER ARM;  Surgeon: Elam Dutch, MD;  Location: [place];  Service: Vascular;  Laterality: Right;  . BIOPSY  01/25/2018   Procedure: BIOPSY;  Surgeon: Jerene Bears, MD;  Location: [place];  Service: Gastroenterology;;  . BIOPSY  04/10/2019   Procedure: BIOPSY;  Surgeon: Jerene Bears, MD;  Location: [facility];  Service: Gastroenterology;;  . COLONOSCOPY    . COLONOSCOPY WITH PROPOFOL N/A 01/25/2018   Procedure: COLONOSCOPY WITH PROPOFOL;  Surgeon: Jerene Bears, MD;  Location: [place];  Service: Gastroenterology;  Laterality: N/A;  . CORONARY STENT INTERVENTION N/A 02/22/2017   Procedure: CORONARY STENT INTERVENTION;  Surgeon: Nigel Mormon, MD;  Location: [place] [facility];  Service: Cardiovascular;  Laterality: N/A;  . ESOPHAGOGASTRODUODENOSCOPY (EGD) WITH PROPOFOL N/A 01/25/2018   Procedure: ESOPHAGOGASTRODUODENOSCOPY (EGD) WITH PROPOFOL;  Surgeon: Jerene Bears, MD;  Location: [place];  Service: Gastroenterology;  Laterality: N/A;  . ESOPHAGOGASTRODUODENOSCOPY (EGD) WITH PROPOFOL N/A 04/10/2019   Procedure: ESOPHAGOGASTRODUODENOSCOPY (EGD) WITH PROPOFOL;  Surgeon: Jerene Bears, MD;  Location: [facility];  Service: Gastroenterology;  Laterality: N/A;  . FLEXIBLE SIGMOIDOSCOPY N/A 07/15/2017   Procedure: FLEXIBLE SIGMOIDOSCOPY;  Surgeon: Carol Ada, MD;  Location: [place];  Service: Endoscopy;  Laterality: N/A;  . HEMORRHOID BANDING    . I & D EXTREMITY Left 06/01/2017   Procedure: IRRIGATION AND DEBRIDEMENT LEFT ARM HEMATOMA WITH LIGATION OF LEFT ARM AV FISTULA;  Surgeon: Elam Dutch, MD;  Location: [place];  Service: Vascular;  Laterality: Left;  . I & D EXTREMITY Left 06/14/2017   Procedure: IRRIGATION AND DEBRIDEMENT  EXTREMITY;  Surgeon: Katha Cabal, MD;  Location: [facility];  Service: Vascular;  Laterality: Left;  . INSERTION OF DIALYSIS CATHETER  05/30/2017  . INSERTION OF DIALYSIS CATHETER N/A 05/30/2017   Procedure: INSERTION OF DIALYSIS CATHETER;  Surgeon: Elam Dutch, MD;  Location: [place];  Service: Vascular;  Laterality: N/A;  . IR PARACENTESIS  08/30/2017  . IR PARACENTESIS  09/29/2017  . IR PARACENTESIS  10/28/2017  . IR PARACENTESIS  11/09/2017  . IR PARACENTESIS  11/16/2017  . IR PARACENTESIS  11/28/2017  . IR PARACENTESIS  12/01/2017  . IR PARACENTESIS  12/06/2017  . IR PARACENTESIS  01/03/2018  . IR PARACENTESIS  01/23/2018  . IR PARACENTESIS  02/07/2018  . IR PARACENTESIS  02/21/2018  . IR PARACENTESIS  03/06/2018  . IR PARACENTESIS  03/17/2018  . IR PARACENTESIS  04/04/2018  . IR PARACENTESIS  12/28/2018  . IR PARACENTESIS  01/08/2019  . IR PARACENTESIS  01/23/2019  . IR PARACENTESIS  02/01/2019  . IR PARACENTESIS  02/19/2019  . IR PARACENTESIS  03/01/2019  . IR PARACENTESIS  03/15/2019  .  IR PARACENTESIS  04/03/2019  . IR PARACENTESIS  04/12/2019  . IR PARACENTESIS  05/01/2019  . IR PARACENTESIS  05/08/2019  . IR PARACENTESIS  05/24/2019  . IR PARACENTESIS  06/12/2019  . IR PARACENTESIS  07/09/2019  . IR PARACENTESIS  07/27/2019  . IR PARACENTESIS  08/09/2019  . IR PARACENTESIS  08/21/2019  . IR PARACENTESIS  09/17/2019  . IR PARACENTESIS  10/05/2019  . IR PARACENTESIS  10/29/2019  . IR PARACENTESIS  11/08/2019  . IR PARACENTESIS  12/12/2019  . IR PARACENTESIS  01/03/2020  . IR PARACENTESIS  01/10/2020  . IR PARACENTESIS  01/17/2020  . IR PARACENTESIS  01/24/2020  . IR PARACENTESIS  01/31/2020  . IR PARACENTESIS  02/07/2020  . IR PARACENTESIS  02/21/2020  . IR RADIOLOGIST EVAL & MGMT  02/14/2018  . IR RADIOLOGIST EVAL & MGMT  02/22/2019  . LEFT HEART CATH AND CORONARY ANGIOGRAPHY N/A 02/22/2017   Procedure: LEFT HEART CATH AND CORONARY ANGIOGRAPHY;  Surgeon: Nigel Mormon, MD;  Location: [place] [facility];  Service: Cardiovascular;  Laterality: N/A;  . LIGATION OF ARTERIOVENOUS  FISTULA Left 08/18/8339   Procedure: Plication of Left Arm Arteriovenous Fistula;  Surgeon: Elam Dutch, MD;  Location: [place];  Service: Vascular;  Laterality: Left;  . POLYPECTOMY    . POLYPECTOMY  01/25/2018   Procedure: POLYPECTOMY;  Surgeon: Jerene Bears, MD;  Location: [place];  Service: Gastroenterology;;  . REVISON OF ARTERIOVENOUS FISTULA Left 9/62/2297   Procedure: PLICATION OF DISTAL ANEURYSMAL SEGEMENT OF LEFT UPPER ARM ARTERIOVENOUS FISTULA;  Surgeon: Elam Dutch, MD;  Location: [place];  Service: Vascular;  Laterality: Left;  . REVISON OF ARTERIOVENOUS FISTULA Left 9/89/2119   Procedure: Plication of Left Upper Arm Fistula ;  Surgeon: Waynetta Sandy, MD;  Location: [place];  Service: Vascular;  Laterality: Left;  . SKIN GRAFT SPLIT THICKNESS LEG / FOOT Left    SKIN GRAFT SPLIT THICKNESS LEFT ARM DONOR SITE: LEFT ANTERIOR THIGH  . SKIN SPLIT GRAFT Left 07/04/2017   Procedure: SKIN GRAFT SPLIT THICKNESS LEFT ARM DONOR SITE: LEFT ANTERIOR THIGH;  Surgeon: Elam Dutch, MD;  Location: [place];  Service: Vascular;  Laterality: Left;  . THROMBECTOMY W/ EMBOLECTOMY Left 06/05/2017   Procedure: EXPLORATION OF LEFT ARM FOR BLEEDING; OVERSEWED PROXIMAL FISTULA;  Surgeon: Angelia Mould, MD;  Location: [place];  Service: Vascular;  Laterality: Left;  . WOUND EXPLORATION Left 06/03/2017   Procedure: WOUND EXPLORATION WITH WOUND VAC APPLICATION TO LEFT ARM;  Surgeon: Angelia Mould, MD;  Location: [facility] OR;  Service: Vascular;  Laterality: Left;   Family History  Problem Relation Age of Onset  . Heart disease Mother   . Lung cancer Mother   . Heart disease Father   . Malignant hyperthermia Father   . COPD Father   . Throat cancer Sister   . Esophageal cancer Sister   . Hypertension Other   . COPD Other   . Colon cancer Neg Hx   . Colon polyps Neg Hx   . Rectal  cancer Neg Hx   . Stomach cancer Neg Hx     Social History   Tobacco Use  . Smoking status: Current Every Day Smoker    Packs/day: 0.50    Years: 43.00    Pack years: 21.50    Types: Cigarettes    Start date: 08/13/1973  . Smokeless tobacco: Never Used  Substance Use Topics  . Alcohol use: Not Currently  Comment: quit drinking in 2017   Marital Status: Single   ROS  Review of Systems  HENT: Negative for sore throat.   Cardiovascular: Positive for dyspnea on exertion (chronic, stable ). Negative for leg swelling, palpitations, paroxysmal nocturnal dyspnea and syncope.  Respiratory: Negative for hemoptysis and wheezing.   Endocrine: Negative for cold intolerance.  Hematologic/Lymphatic: Does not bruise/bleed easily.  Gastrointestinal: Negative for abdominal pain, hematochezia and melena.  Neurological: Negative for dizziness.   Objective  Blood pressure (!) 96/46, pulse 69, resp. rate 17, height 5\' 9"  (1.753 m), weight 140 lb (63.5 kg), SpO2 97 %.  Vitals with BMI 03/25/2020 02/21/2020 02/19/2020  Height 5\' 9"  - -  Weight 140 lbs - -  BMI 19.62 - -  Systolic 96 85 229  Diastolic 46 54 66  Pulse 69 - 98  Some encounter information is confidential and restricted. Go to Review Flowsheets activity to see all data.     Physical Exam Vitals reviewed.  Constitutional:      General: He is not in acute distress.    Appearance: He is cachectic. He is ill-appearing.     Comments: Appears older than stated age.  Cardiovascular:     Rate and Rhythm: Normal rate. Rhythm irregularly irregular.     Heart sounds: Murmur heard.  Early systolic murmur is present with a grade of 2/6 at the upper right sternal border.  Low-pitched rumbling crescendo presystolic murmur is present with a grade of 3/4 at the apex.      Comments: S1 normal and S2 loud. No murmur. No JVD. No edema.  Right arm AV graft noted Pulmonary:     Effort: Pulmonary effort is normal. No accessory muscle usage or  respiratory distress.     Breath sounds: Normal breath sounds.  Abdominal:     General: Bowel sounds are normal. There is distension.     Palpations: Abdomen is soft.  Musculoskeletal:        General: Normal range of motion.    Laboratory examination:   Recent Labs    02/17/20 1208 02/18/20 0002 02/18/20 0215  NA 135 131* 136  K 6.7* 6.5* 3.7  CL 92* 90* 96*  CO2 28 26 28   GLUCOSE 105* 108* 109*  BUN 40* 49* 16  CREATININE 5.89* 6.37* 2.90*  CALCIUM 9.6 9.8 9.2  GFRNONAA 10* 9* 23*  GFRAA 11* 10* 27*   CrCl cannot be calculated (Patient's most recent lab result is older than the maximum 21 days allowed.).  CMP Latest Ref Rng & Units 02/18/2020 02/18/2020 02/17/2020  Glucose 70 - 99 mg/dL 109(H) 108(H) 105(H)  BUN 6 - 20 mg/dL 16 49(H) 40(H)  Creatinine 0.61 - 1.24 mg/dL 2.90(H) 6.37(H) 5.89(H)  Sodium 135 - 145 mmol/L 136 131(L) 135  Potassium 3.5 - 5.1 mmol/L 3.7 6.5(HH) 6.7(HH)  Chloride 98 - 111 mmol/L 96(L) 90(L) 92(L)  CO2 22 - 32 mmol/L 28 26 28   Calcium 8.9 - 10.3 mg/dL 9.2 9.8 9.6  Total Protein 6.5 - 8.1 g/dL 6.3(L) - 6.3(L)  Total Bilirubin 0.3 - 1.2 mg/dL 0.8 - 1.2  Alkaline Phos 38 - 126 U/L 276(H) - 272(H)  AST 15 - 41 U/L 26 - 26  ALT 0 - 44 U/L 32 - 34   CBC Latest Ref Rng & Units 02/18/2020 02/17/2020 02/13/2020  WBC 4.0 - 10.5 K/uL 10.8(H) 12.1(H) 13.0(H)  Hemoglobin 13.0 - 17.0 g/dL 9.1(L) 9.6(L) 10.0(L)  Hematocrit 39 - 52 % 27.0(L) 28.8(L) 29.0(L)  Platelets  150 - 400 K/uL 180 160 135(L)   Lipid Panel     Component Value Date/Time   CHOL 140 02/18/2020 0001   TRIG 107 02/18/2020 0001   HDL 38 (L) 02/18/2020 0001   CHOLHDL 3.7 02/18/2020 0001   VLDL 21 02/18/2020 0001   LDLCALC 81 02/18/2020 0001   HEMOGLOBIN A1C Lab Results  Component Value Date   HGBA1C 4.8 01/26/2020   MPG 91.06 01/26/2020   TSH No results for input(s): TSH in the last 8760 hours.  Lipid Panel Recent Labs    10/04/19 1400 02/18/20 0001  CHOL  --  140  TRIG 183* 107    LDLCALC  --  81  VLDL  --  21  HDL  --  38*  CHOLHDL  --  3.7    External labs:   03/11/2018: TSH 1.32 normal  Medications and allergies   Allergies  Allergen Reactions  . Tramadol Itching and Other (See Comments)  . Morphine And Related Other (See Comments)    Stomach pain  . Pollen Extract Other (See Comments)    Other reaction(s): Sneezing (finding)  . Acetaminophen Nausea Only    Stomach ache   . Aspirin Itching and Other (See Comments)    STOMACH PAIN   . Clonidine Derivatives Itching     Current Outpatient Medications  Medication Instructions  . albuterol (VENTOLIN HFA) 108 (90 Base) MCG/ACT inhaler 2 puffs, Inhalation, Every 4 hours PRN  . amiodarone (PACERONE) 200 mg, Oral, Daily  . diltiazem (CARDIZEM CD) 120 mg, Oral, Daily  . diphenhydrAMINE (BENADRYL) 25 mg, Oral, Every 6 hours PRN  . gabapentin (NEURONTIN) 100 mg, Oral, 3 times daily  . metoprolol tartrate (LOPRESSOR) 12.5 mg, Oral, 2 times daily  . Oxycodone HCl 10 mg, Oral, 3 times daily PRN  . pantoprazole (PROTONIX) 40 mg, Oral, 2 times daily  . sevelamer carbonate (RENVELA) 2,400 mg, Oral, See admin instructions, Take 3 tablets (2400 mg) by mouth up to three times daily with meals  . tenofovir (VIREAD) 300 mg, Oral, Every Mon   Radiology:    Cardiac Studies:   Coronary Angiogram 02/22/2017: No significant disease on left, mid RCA high-grade stenosis status post 3.5 x 23 mm Xience [place] DES.  Carotid Doppler 10/28/2016: Stenosis in the right common carotid artery (<50%) with severe heteregenous plaque. Stenosis in the left internal carotid artery (16-49%). Severe heteregenous plaque in the left common carotid artery with < 50% stenosis. Mild stenosis in the left external carotid artery (<50%). Antegrade vertebral artery flow. Follow up in one year is appropriate if clinically indicated.  Lower Venous DVT Study 10/05/2019: BILATERAL: - No evidence of deep vein thrombosis seen in the lower  extremities, bilaterally.  RIGHT & LEFT: - No cystic structure found in the popliteal fossa.   Echocardiogram 10/04/2019: 1. Low normal LV systolic function; severe LVH (myocardium with specked appearance; consider amyloid); mild to moderate AS (mean gradient 11 mmHg  and AVA 1.3 cm2); mild AI; thickened MV with mild to moderate MS (mean gradient 6 mmHg and MVA 1.2-2 cm2;  pt in atrial fibrillation at time of study); severe biatrial enlargement; mild RVE; severe RV dysfunction; severe TR with severe pulmonary hypertension.  2. Left ventricular ejection fraction, by estimation, is 50 to 55%. The left ventricle has low normal function. The left ventricle has no regional  wall motion abnormalities. There is severe left ventricular hypertrophy.  Left ventricular diastolic function could not be evaluated.  3. Right ventricular systolic function is  severely reduced. The right ventricular size is mildly enlarged. There is severely elevated pulmonary artery systolic pressure.  4. Left atrial size was severely dilated.  5. Right atrial size was severely dilated.  6. Large pleural effusion in the left lateral region.  7. The mitral valve is normal in structure. Mild mitral valve regurgitation. Mild to moderate mitral stenosis.  8. Tricuspid valve regurgitation is severe.  9. The aortic valve is normal in structure. Aortic valve regurgitation is mild. Mild to moderate aortic valve stenosis.  10. The inferior vena cava is dilated in size with <50% respiratory variability, suggesting right atrial pressure of 15 mmHg.  Echocardiogram 02/05/2020: 1. Left ventricular ejection fraction, by estimation, is 50 to 55%. The left ventricle has normal function. There is severe left ventricular hypertrophy. Left ventricular diastolic function could not be evaluated. I cannot exclude septal hypokinesis. Endocardium not well visualized. septal motion also is consistent with LBBB.  2. Right ventricular systolic function is  mildly reduced. The right ventricular size is moderately enlarged. Severely increased right ventricular wall thickness. There is moderately elevated pulmonary artery systolic pressure. The estimated right ventricular systolic pressure is 40.1 mmHg.  3. Left atrial size was severely dilated.  4. Right atrial size was severely dilated.  5. The mitral valve is normal in structure. Mild mitral valve regurgitation.  6. Dilated TV annulus. Tricuspid valve regurgitation is moderate to severe.  7. AV sclerosis without stenosis. The aortic valve is tricuspid. Aortic valve regurgitation is trivial. Mild aortic valve sclerosis is present, with no evidence of aortic valve stenosis. Aortic valve mean gradient measures 8.3 mmHg.  8. The inferior vena cava is dilated in size with <50% respiratory variability, suggesting right atrial pressure of 15 mmHg.    EKG EKG 03/25/2020: Atrial fibrillation with controlled ventricular response at a rate of 81 bpm, complete left bundle branch block. No further analysis.  Compared to EKG 02/07/2020 ventricular rate controlled.   EKG 02/07/2020: Atrial fibrillation with rapid ventricular response, ventricular rate 111 bpm, left bundle branch block.  No further analysis.  EKG 12/27/2019: Sinus rhythm with first-degree AV block at rate of 77 bpm, rare PACs, left bundle branch block.  No further analysis.  EKG 12/03/2019: Underlying possibly atypical atrial flutter with 2: 1 conduction, ventricular rate 92 bpm, left bundle branch block.  No further analysis.  PVC.    08/01/2019: Atrial fibrillation at 79 bpm, LBBB. No further analysis due to LBBB.  Assessment     ICD-10-CM   1. Paroxysmal atrial fibrillation (HCC)  I48.0 EKG 12-Lead  2. Cor pulmonale, chronic (HCC)  I27.81   3. CKD (chronic kidney disease) stage V requiring chronic dialysis (HCC)  N18.6    Z99.2   4. Gastroesophageal reflux disease without esophagitis  K21.9 pantoprazole (PROTONIX) 40 MG tablet   .    Meds ordered this encounter  Medications  . pantoprazole (PROTONIX) 40 MG tablet    Sig: Take 1 tablet (40 mg total) by mouth 2 (two) times daily.    Dispense:  60 tablet    Refill:  0    Medications Discontinued During This Encounter  Medication Reason  . pantoprazole (PROTONIX) 40 MG tablet Reorder    Recommendations:   [name]  is a 59 y.o. male  with very complex medical problems including severe COPD and chronic cor pulmonale with pulmonary hypertension, coronary artery disease s/p stenting to mid RCA in 2018, paroxysmal atrial fibrillation with RVR, hypertensive cardiomyopathy, dietary and medication noncompliance, end-stage renal  disease on hemodialysis, chronic hepatitis B and C, tobacco use, smokes 1/2 ppd, h/o marijuana use and prior cocaine use quit in 2017. Not on anticoagulation due to cirrhosis of liver and compliance with Warfarin.   He has had multiple ER visits for A fib with RVR, syncope, shortness of breath and hyperkalemia.  He has also been in the hospital with acute respiratory distress and pulmonary edema due to missed dialysis.   Presents today for 3-week follow-up on atrial fibrillation and CHF.  There are currently no clinical signs of heart failure and patient is in atrial fibrillation and is currently well rate controlled.  He is presently taking amiodarone, diltiazem, metoprolol tartrate.  Will continue current cardiac medications as patient is presently doing well and reports that he is taking them as directed.  Patient patient's blood pressure is low (96/46) today, however patient is not experiencing symptoms of hypotension.  Discussed with patient the importance of continuing his medications as directed, as well as following regularly with his other providers especially in regard to dialysis and paracentesis.  Patient has not yet established care with a new primary care provider and therefore he is requesting a refill of his Protonix.  Will send an  order for 1 month supply of Protonix, encouraged patient to establish care with a PCP as soon as possible so that they can manage his comorbid conditions.  He also requests a letter stating he would benefit from a service dog, will provide letter today.   Follow-up in 1 month for atrial fibrillation, CHF.   Patient was seen in collaboration with Dr. Einar Gip. He also reviewed patient's chart and Dr. Einar Gip is in agreement of the plan.    Alethia Berthold, PA-C 03/25/2020, 4:18 PM Office: [phone]

## 2020-03-25 ENCOUNTER — Encounter: Payer: Self-pay | Admitting: Cardiology

## 2020-03-25 ENCOUNTER — Ambulatory Visit: Payer: Medicare Other | Admitting: Cardiology

## 2020-03-25 ENCOUNTER — Other Ambulatory Visit: Payer: Self-pay

## 2020-03-25 VITALS — BP 96/46 | HR 69 | Resp 17 | Ht 69.0 in | Wt 140.0 lb

## 2020-03-25 DIAGNOSIS — N186 End stage renal disease: Secondary | ICD-10-CM

## 2020-03-25 DIAGNOSIS — I2781 Cor pulmonale (chronic): Secondary | ICD-10-CM

## 2020-03-25 DIAGNOSIS — I480 Paroxysmal atrial fibrillation: Secondary | ICD-10-CM

## 2020-03-25 DIAGNOSIS — Z992 Dependence on renal dialysis: Secondary | ICD-10-CM

## 2020-03-25 DIAGNOSIS — K219 Gastro-esophageal reflux disease without esophagitis: Secondary | ICD-10-CM

## 2020-03-25 MED ORDER — PANTOPRAZOLE SODIUM 40 MG PO TBEC: 40.0000 mg | DELAYED_RELEASE_TABLET | Freq: Two times a day (BID) | ORAL | 0 refills | Status: DC

## 2020-03-27 ENCOUNTER — Ambulatory Visit: Payer: Medicare Other | Attending: Physician Assistant | Admitting: Physician Assistant

## 2020-03-27 ENCOUNTER — Ambulatory Visit (HOSPITAL_COMMUNITY): Admission: RE | Admit: 2020-03-27 | Payer: Medicare Other | Source: Ambulatory Visit

## 2020-03-28 ENCOUNTER — Telehealth: Payer: Self-pay

## 2020-03-28 NOTE — Telephone Encounter (Signed)
Palliative care volunteer support call made, no answer 

## 2020-04-20 ENCOUNTER — Other Ambulatory Visit: Payer: Self-pay | Admitting: Cardiology

## 2020-04-20 DIAGNOSIS — I10 Essential (primary) hypertension: Secondary | ICD-10-CM

## 2020-04-20 DIAGNOSIS — I480 Paroxysmal atrial fibrillation: Secondary | ICD-10-CM

## 2020-04-24 ENCOUNTER — Ambulatory Visit: Payer: Medicare Other | Admitting: Cardiology

## 2020-05-01 ENCOUNTER — Other Ambulatory Visit: Payer: Self-pay | Admitting: Pharmacist

## 2020-05-01 DIAGNOSIS — I480 Paroxysmal atrial fibrillation: Secondary | ICD-10-CM

## 2020-05-01 MED ORDER — METOPROLOL SUCCINATE ER 25 MG PO TB24: 25.0000 mg | ORAL_TABLET | Freq: Every day | ORAL | 2 refills | Status: DC

## 2020-05-01 NOTE — Progress Notes (Signed)
Patient walked into office stating that he feels dizzy and does not like to be on metoprolol tartrate.  His blood pressure is soft, unable to use any other drugs, will try metoprolol succinate.    ICD-10-CM   1. Paroxysmal atrial fibrillation (HCC)  I48.0 metoprolol succinate (TOPROL XL) 25 MG 24 hr tablet   Medications Discontinued During This Encounter  Medication Reason  . metoprolol tartrate (LOPRESSOR) 25 MG tablet Change in therapy     Adrian Prows, MD, [facility] 05/01/2020, 1:02 PM Office: [phone] Pager: [phone]

## 2020-05-02 ENCOUNTER — Ambulatory Visit: Payer: Medicare Other | Admitting: Cardiology

## 2020-05-02 ENCOUNTER — Encounter: Payer: Self-pay | Admitting: Cardiology

## 2020-05-02 VITALS — BP 113/52 | HR 73 | Resp 16 | Ht 69.0 in | Wt 142.0 lb

## 2020-05-02 DIAGNOSIS — R110 Nausea: Secondary | ICD-10-CM

## 2020-05-02 DIAGNOSIS — K219 Gastro-esophageal reflux disease without esophagitis: Secondary | ICD-10-CM

## 2020-05-02 DIAGNOSIS — I2781 Cor pulmonale (chronic): Secondary | ICD-10-CM

## 2020-05-02 DIAGNOSIS — I480 Paroxysmal atrial fibrillation: Secondary | ICD-10-CM

## 2020-05-02 MED ORDER — ONDANSETRON HCL 4 MG PO TABS: 4.0000 mg | ORAL_TABLET | Freq: Three times a day (TID) | ORAL | 2 refills | Status: DC | PRN

## 2020-05-02 NOTE — Progress Notes (Signed)
Primary Physician/Referring:  Patient, No Pcp Per  Patient ID: [name], male    DOB: 06-Jan-1961, 59 y.o.   MRN: [id number]  Chief Complaint  Patient presents with  . Follow-up    1 month  . Atrial Fibrillation   HPI:    [name]  is a 59 y.o. male  with very complex medical problems including severe COPD and chronic cor pulmonale with pulmonary hypertension, coronary artery disease s/p stenting to mid RCA in 2018, paroxysmal atrial fibrillation with RVR, hypertensive cardiomyopathy, dietary and medication noncompliance, end-stage renal disease on hemodialysis, chronic hepatitis B and C with cirrhosis of the liver and ascites, tobacco use, smokes 1/2 ppd, h/o marijuana use and prior cocaine use quit in 2017. Not on anticoagulation due to cirrhosis of liver and compliance with Warfarin.   He has had multiple ER visits for A fib with RVR, near syncope, shortness of breath, acute diastolic heart failure and hyperkalemia.  He has also been in the hospital with acute respiratory distress and pulmonary edema due to missed dialysis.  Last visit was on 02/07/2020, since then patient has had 2 hospital admissions for atrial fibrillation with RVR and shortness of breath secondary to missed dialysis.   Patient presents today for 1 month follow-up on atrial fibrillation and CHF, he did not bring medications or medication list with him today.  He is not currently experiencing symptoms of chest pain, palpitations, syncope, dizziness, orthopnea, PND, leg swelling.  Has chronic shortness of breath, reports it is stable at this time.  He is requesting refill of Zofran, states that he also needs blood pressure medication but he does not know which one.  Past Medical History:  Diagnosis Date  . Anemia   . Anxiety   . Arthritis    left shoulder  . Atherosclerosis of aorta ([place])   . Cardiomegaly   . Chest pain    DATE UNKNOWN, C/O PERIODICALLY  . Cocaine abuse ([place])   . COPD  exacerbation ([place]) 08/17/2016  . Coronary artery disease    stent 02/22/17  . ESRD (end stage renal disease) on dialysis ([place])    "[name]; MWF" (07/04/2017)  . GERD (gastroesophageal reflux disease)    DATE UNKNOWN  . Hemorrhoids   . Hepatitis B, chronic ([place])   . Hepatitis C   . History of kidney stones   . Hyperkalemia   . Hypertension   . Kidney failure   . Metabolic bone disease    Patient denies  . Mitral stenosis   . Myocardial infarction ([place])   . Pneumonia   . Pulmonary edema   . Solitary rectal ulcer syndrome 07/2017   at flex sig for rectal bleeding  . Tubular adenoma of colon    Past Surgical History:  Procedure Laterality Date  . A/V FISTULAGRAM Left 05/26/2017   Procedure: A/V FISTULAGRAM;  Surgeon: Conrad Lakota, MD;  Location: [place] [facility];  Service: Cardiovascular;  Laterality: Left;  . A/V FISTULAGRAM Right 11/18/2017   Procedure: A/V FISTULAGRAM - Right Arm;  Surgeon: Elam Dutch, MD;  Location: [place] [facility];  Service: Cardiovascular;  Laterality: Right;  . APPLICATION OF WOUND VAC Left 06/14/2017   Procedure: APPLICATION OF WOUND VAC;  Surgeon: Katha Cabal, MD;  Location: [facility];  Service: Vascular;  Laterality: Left;  . AV FISTULA PLACEMENT  2012   BELIEVED WAS PLACED IN JUNE  . AV FISTULA PLACEMENT Right 08/09/2017   Procedure: Creation Right arm  ARTERIOVENOUS BRACHIOCEPOHALIC FISTULA;  Surgeon: Elam Dutch, MD;  [facility];  Service: Vascular;  Laterality: Right;  . AV FISTULA PLACEMENT Right 11/22/2017   Procedure: INSERTION OF ARTERIOVENOUS (AV) GORE-TEX GRAFT RIGHT UPPER ARM;  Surgeon: Elam Dutch, MD;  Location: [place];  Service: Vascular;  Laterality: Right;  . BIOPSY  01/25/2018   Procedure: BIOPSY;  Surgeon: Jerene Bears, MD;  Location: [place];  Service: Gastroenterology;;  . BIOPSY  04/10/2019   Procedure: BIOPSY;  Surgeon: Jerene Bears, MD;  Location: [facility];  Service: Gastroenterology;;  .  COLONOSCOPY    . COLONOSCOPY WITH PROPOFOL N/A 01/25/2018   Procedure: COLONOSCOPY WITH PROPOFOL;  Surgeon: Jerene Bears, MD;  Location: [place];  Service: Gastroenterology;  Laterality: N/A;  . CORONARY STENT INTERVENTION N/A 02/22/2017   Procedure: CORONARY STENT INTERVENTION;  Surgeon: Nigel Mormon, MD;  Location: [place] [facility];  Service: Cardiovascular;  Laterality: N/A;  . ESOPHAGOGASTRODUODENOSCOPY (EGD) WITH PROPOFOL N/A 01/25/2018   Procedure: ESOPHAGOGASTRODUODENOSCOPY (EGD) WITH PROPOFOL;  Surgeon: Jerene Bears, MD;  Location: [place];  Service: Gastroenterology;  Laterality: N/A;  . ESOPHAGOGASTRODUODENOSCOPY (EGD) WITH PROPOFOL N/A 04/10/2019   Procedure: ESOPHAGOGASTRODUODENOSCOPY (EGD) WITH PROPOFOL;  Surgeon: Jerene Bears, MD;  Location: [facility];  Service: Gastroenterology;  Laterality: N/A;  . FLEXIBLE SIGMOIDOSCOPY N/A 07/15/2017   Procedure: FLEXIBLE SIGMOIDOSCOPY;  Surgeon: Carol Ada, MD;  Location: [place];  Service: Endoscopy;  Laterality: N/A;  . HEMORRHOID BANDING    . I & D EXTREMITY Left 06/01/2017   Procedure: IRRIGATION AND DEBRIDEMENT LEFT ARM HEMATOMA WITH LIGATION OF LEFT ARM AV FISTULA;  Surgeon: Elam Dutch, MD;  Location: [place];  Service: Vascular;  Laterality: Left;  . I & D EXTREMITY Left 06/14/2017   Procedure: IRRIGATION AND DEBRIDEMENT EXTREMITY;  Surgeon: Katha Cabal, MD;  Location: [facility];  Service: Vascular;  Laterality: Left;  . INSERTION OF DIALYSIS CATHETER  05/30/2017  . INSERTION OF DIALYSIS CATHETER N/A 05/30/2017   Procedure: INSERTION OF DIALYSIS CATHETER;  Surgeon: Elam Dutch, MD;  Location: [place];  Service: Vascular;  Laterality: N/A;  . IR PARACENTESIS  08/30/2017  . IR PARACENTESIS  09/29/2017  . IR PARACENTESIS  10/28/2017  . IR PARACENTESIS  11/09/2017  . IR PARACENTESIS  11/16/2017  . IR PARACENTESIS  11/28/2017  . IR PARACENTESIS  12/01/2017  . IR PARACENTESIS  12/06/2017  . IR  PARACENTESIS  01/03/2018  . IR PARACENTESIS  01/23/2018  . IR PARACENTESIS  02/07/2018  . IR PARACENTESIS  02/21/2018  . IR PARACENTESIS  03/06/2018  . IR PARACENTESIS  03/17/2018  . IR PARACENTESIS  04/04/2018  . IR PARACENTESIS  12/28/2018  . IR PARACENTESIS  01/08/2019  . IR PARACENTESIS  01/23/2019  . IR PARACENTESIS  02/01/2019  . IR PARACENTESIS  02/19/2019  . IR PARACENTESIS  03/01/2019  . IR PARACENTESIS  03/15/2019  . IR PARACENTESIS  04/03/2019  . IR PARACENTESIS  04/12/2019  . IR PARACENTESIS  05/01/2019  . IR PARACENTESIS  05/08/2019  . IR PARACENTESIS  05/24/2019  . IR PARACENTESIS  06/12/2019  . IR PARACENTESIS  07/09/2019  . IR PARACENTESIS  07/27/2019  . IR PARACENTESIS  08/09/2019  . IR PARACENTESIS  08/21/2019  . IR PARACENTESIS  09/17/2019  . IR PARACENTESIS  10/05/2019  . IR PARACENTESIS  10/29/2019  . IR PARACENTESIS  11/08/2019  . IR PARACENTESIS  12/12/2019  . IR PARACENTESIS  01/03/2020  . IR  PARACENTESIS  01/10/2020  . IR PARACENTESIS  01/17/2020  . IR PARACENTESIS  01/24/2020  . IR PARACENTESIS  01/31/2020  . IR PARACENTESIS  02/07/2020  . IR PARACENTESIS  02/21/2020  . IR RADIOLOGIST EVAL & MGMT  02/14/2018  . IR RADIOLOGIST EVAL & MGMT  02/22/2019  . LEFT HEART CATH AND CORONARY ANGIOGRAPHY N/A 02/22/2017   Procedure: LEFT HEART CATH AND CORONARY ANGIOGRAPHY;  Surgeon: Nigel Mormon, MD;  Location: [place] [facility];  Service: Cardiovascular;  Laterality: N/A;  . LIGATION OF ARTERIOVENOUS  FISTULA Left 01/17/9380   Procedure: Plication of Left Arm Arteriovenous Fistula;  Surgeon: Elam Dutch, MD;  Location: [place];  Service: Vascular;  Laterality: Left;  . POLYPECTOMY    . POLYPECTOMY  01/25/2018   Procedure: POLYPECTOMY;  Surgeon: Jerene Bears, MD;  Location: [place];  Service: Gastroenterology;;  . REVISON OF ARTERIOVENOUS FISTULA Left 0/17/5102   Procedure: PLICATION OF DISTAL ANEURYSMAL SEGEMENT OF LEFT UPPER ARM ARTERIOVENOUS FISTULA;  Surgeon: Elam Dutch, MD;   Location: [place];  Service: Vascular;  Laterality: Left;  . REVISON OF ARTERIOVENOUS FISTULA Left 5/85/2778   Procedure: Plication of Left Upper Arm Fistula ;  Surgeon: Waynetta Sandy, MD;  Location: [place];  Service: Vascular;  Laterality: Left;  . SKIN GRAFT SPLIT THICKNESS LEG / FOOT Left    SKIN GRAFT SPLIT THICKNESS LEFT ARM DONOR SITE: LEFT ANTERIOR THIGH  . SKIN SPLIT GRAFT Left 07/04/2017   Procedure: SKIN GRAFT SPLIT THICKNESS LEFT ARM DONOR SITE: LEFT ANTERIOR THIGH;  Surgeon: Elam Dutch, MD;  Location: [place];  Service: Vascular;  Laterality: Left;  . THROMBECTOMY W/ EMBOLECTOMY Left 06/05/2017   Procedure: EXPLORATION OF LEFT ARM FOR BLEEDING; OVERSEWED PROXIMAL FISTULA;  Surgeon: Angelia Mould, MD;  Location: [place];  Service: Vascular;  Laterality: Left;  . WOUND EXPLORATION Left 06/03/2017   Procedure: WOUND EXPLORATION WITH WOUND VAC APPLICATION TO LEFT ARM;  Surgeon: Angelia Mould, MD;  Location: [facility] OR;  Service: Vascular;  Laterality: Left;   Family History  Problem Relation Age of Onset  . Heart disease Mother   . Lung cancer Mother   . Heart disease Father   . Malignant hyperthermia Father   . COPD Father   . Throat cancer Sister   . Esophageal cancer Sister   . Hypertension Other   . COPD Other   . Colon cancer Neg Hx   . Colon polyps Neg Hx   . Rectal cancer Neg Hx   . Stomach cancer Neg Hx     Social History   Tobacco Use  . Smoking status: Current Every Day Smoker    Packs/day: 0.50    Years: 43.00    Pack years: 21.50    Types: Cigarettes    Start date: 08/13/1973  . Smokeless tobacco: Never Used  Substance Use Topics  . Alcohol use: Not Currently    Comment: quit drinking in 2017   Marital Status: Single   ROS  Review of Systems  HENT: Negative for sore throat.   Cardiovascular: Positive for dyspnea on exertion (chronic, stable ). Negative for leg swelling, palpitations, paroxysmal nocturnal dyspnea and syncope.   Respiratory: Negative for wheezing.   Hematologic/Lymphatic: Does not bruise/bleed easily.  Gastrointestinal: Positive for nausea. Negative for abdominal pain, hematochezia and melena.   Objective  Blood pressure (!) 113/52, pulse 73, resp. rate 16, height 5\' 9"  (1.753 m), weight 142 lb (64.4 kg), SpO2 96 %.  Vitals with BMI 05/02/2020 03/25/2020 02/21/2020  Height 5\' 9"  5\' 9"  -  Weight 142 lbs 140 lbs -  BMI 01.02 72.53 -  Systolic 664 96 85  Diastolic 52 46 54  Pulse 73 69 -  Some encounter information is confidential and restricted. Go to Review Flowsheets activity to see all data.     Physical Exam Vitals reviewed.  Constitutional:      General: He is not in acute distress.    Appearance: He is normal weight. He is ill-appearing.     Comments: Appears older than stated age.  Cardiovascular:     Rate and Rhythm: Normal rate. Rhythm irregularly irregular.     Heart sounds: Murmur heard.  Early systolic murmur is present with a grade of 2/6 at the upper right sternal border.  Low-pitched rumbling crescendo presystolic murmur is present with a grade of 3/4 at the apex.      Comments: S1 normal and S2 loud. No murmur. No JVD. No edema.  Right arm AV graft noted Pulmonary:     Effort: Pulmonary effort is normal. No accessory muscle usage or respiratory distress.     Breath sounds: Normal breath sounds.  Abdominal:     General: Bowel sounds are normal. There is distension.     Palpations: Abdomen is soft.  Musculoskeletal:        General: Normal range of motion.    Laboratory examination:   Recent Labs    02/17/20 1208 02/18/20 0002 02/18/20 0215  NA 135 131* 136  K 6.7* 6.5* 3.7  CL 92* 90* 96*  CO2 28 26 28   GLUCOSE 105* 108* 109*  BUN 40* 49* 16  CREATININE 5.89* 6.37* 2.90*  CALCIUM 9.6 9.8 9.2  GFRNONAA 10* 9* 23*  GFRAA 11* 10* 27*   CrCl cannot be calculated (Patient's most recent lab result is older than the maximum 21 days allowed.).  CMP Latest Ref  Rng & Units 02/18/2020 02/18/2020 02/17/2020  Glucose 70 - 99 mg/dL 109(H) 108(H) 105(H)  BUN 6 - 20 mg/dL 16 49(H) 40(H)  Creatinine 0.61 - 1.24 mg/dL 2.90(H) 6.37(H) 5.89(H)  Sodium 135 - 145 mmol/L 136 131(L) 135  Potassium 3.5 - 5.1 mmol/L 3.7 6.5(HH) 6.7(HH)  Chloride 98 - 111 mmol/L 96(L) 90(L) 92(L)  CO2 22 - 32 mmol/L 28 26 28   Calcium 8.9 - 10.3 mg/dL 9.2 9.8 9.6  Total Protein 6.5 - 8.1 g/dL 6.3(L) - 6.3(L)  Total Bilirubin 0.3 - 1.2 mg/dL 0.8 - 1.2  Alkaline Phos 38 - 126 U/L 276(H) - 272(H)  AST 15 - 41 U/L 26 - 26  ALT 0 - 44 U/L 32 - 34   CBC Latest Ref Rng & Units 02/18/2020 02/17/2020 02/13/2020  WBC 4.0 - 10.5 K/uL 10.8(H) 12.1(H) 13.0(H)  Hemoglobin 13.0 - 17.0 g/dL 9.1(L) 9.6(L) 10.0(L)  Hematocrit 39 - 52 % 27.0(L) 28.8(L) 29.0(L)  Platelets 150 - 400 K/uL 180 160 135(L)   Lipid Panel     Component Value Date/Time   CHOL 140 02/18/2020 0001   TRIG 107 02/18/2020 0001   HDL 38 (L) 02/18/2020 0001   CHOLHDL 3.7 02/18/2020 0001   VLDL 21 02/18/2020 0001   LDLCALC 81 02/18/2020 0001   HEMOGLOBIN A1C Lab Results  Component Value Date   HGBA1C 4.8 01/26/2020   MPG 91.06 01/26/2020   TSH No results for input(s): TSH in the last 8760 hours.  Lipid Panel Recent Labs    10/04/19 1400 02/18/20 0001  CHOL  --  140  TRIG 183* 107  LDLCALC  --  81  VLDL  --  21  HDL  --  38*  CHOLHDL  --  3.7    External labs:   03/11/2018: TSH 1.32 normal  Medications and allergies   Allergies  Allergen Reactions  . Tramadol Itching and Other (See Comments)  . Morphine And Related Other (See Comments)    Stomach pain  . Pollen Extract Other (See Comments)    Other reaction(s): Sneezing (finding)  . Acetaminophen Nausea Only    Stomach ache   . Aspirin Itching and Other (See Comments)    STOMACH PAIN   . Clonidine Derivatives Itching     Current Outpatient Medications  Medication Instructions  . albuterol (VENTOLIN HFA) 108 (90 Base) MCG/ACT inhaler 2 puffs,  Inhalation, Every 4 hours PRN  . amiodarone (PACERONE) 200 mg, Oral, Daily  . diltiazem (CARDIZEM CD) 120 mg, Oral, Daily  . diphenhydrAMINE (BENADRYL) 25 mg, Oral, Every 6 hours PRN  . gabapentin (NEURONTIN) 100 mg, Oral, 3 times daily  . metoprolol succinate (TOPROL XL) 25 mg, Oral, Daily  . ondansetron (ZOFRAN) 4 mg, Oral, Every 8 hours PRN  . Oxycodone HCl 10 mg, Oral, 3 times daily PRN  . pantoprazole (PROTONIX) 40 mg, Oral, 2 times daily  . sevelamer carbonate (RENVELA) 2,400 mg, Oral, See admin instructions, Take 3 tablets (2400 mg) by mouth up to three times daily with meals  . tenofovir (VIREAD) 300 mg, Oral, Every Mon   Radiology:    Cardiac Studies:   Coronary Angiogram 02/22/2017: No significant disease on left, mid RCA high-grade stenosis status post 3.5 x 23 mm Xience [place] DES.  Carotid Doppler 10/28/2016: Stenosis in the right common carotid artery (<50%) with severe heteregenous plaque. Stenosis in the left internal carotid artery (16-49%). Severe heteregenous plaque in the left common carotid artery with < 50% stenosis. Mild stenosis in the left external carotid artery (<50%). Antegrade vertebral artery flow. Follow up in one year is appropriate if clinically indicated.  Lower Venous DVT Study 10/05/2019: BILATERAL: - No evidence of deep vein thrombosis seen in the lower extremities, bilaterally.  RIGHT & LEFT: - No cystic structure found in the popliteal fossa.   Echocardiogram 02/05/2020: 1. Left ventricular ejection fraction, by estimation, is 50 to 55%. The left ventricle has normal function. There is severe left ventricular hypertrophy. Left ventricular diastolic function could not be evaluated. I cannot exclude septal hypokinesis. Endocardium not well visualized. septal motion also is consistent with LBBB.  2. Right ventricular systolic function is mildly reduced. The right ventricular size is moderately enlarged. Severely increased right ventricular wall  thickness. There is moderately elevated pulmonary artery systolic pressure. The estimated right ventricular systolic pressure is 33.5 mmHg.  3. Left atrial size was severely dilated.  4. Right atrial size was severely dilated.  5. The mitral valve is normal in structure. Mild mitral valve regurgitation.  6. Dilated TV annulus. Tricuspid valve regurgitation is moderate to severe.  7. AV sclerosis without stenosis. The aortic valve is tricuspid. Aortic valve regurgitation is trivial. Mild aortic valve sclerosis is present, with no evidence of aortic valve stenosis. Aortic valve mean gradient measures 8.3 mmHg.  8. The inferior vena cava is dilated in size with <50% respiratory variability, suggesting right atrial pressure of 15 mmHg.    EKG EKG 03/25/2020: Atrial fibrillation with controlled ventricular response at a rate of 81 bpm, complete left bundle branch block. No further analysis.  Compared to EKG  02/07/2020 ventricular rate controlled.   EKG 02/07/2020: Atrial fibrillation with rapid ventricular response, ventricular rate 111 bpm, left bundle branch block.  No further analysis.  EKG 12/27/2019: Sinus rhythm with first-degree AV block at rate of 77 bpm, rare PACs, left bundle branch block.  No further analysis.  Assessment     ICD-10-CM   1. Paroxysmal atrial fibrillation (HCC)  I48.0   2. Cor pulmonale, chronic (HCC)  I27.81   3. Gastroesophageal reflux disease without esophagitis  K21.9 ondansetron (ZOFRAN) 4 MG tablet  4. Chronic nausea  R11.0 ondansetron (ZOFRAN) 4 MG tablet   .   Meds ordered this encounter  Medications  . ondansetron (ZOFRAN) 4 MG tablet    Sig: Take 1 tablet (4 mg total) by mouth every 8 (eight) hours as needed for nausea or vomiting.    Dispense:  30 tablet    Refill:  2    There are no discontinued medications.  Recommendations:   [name]  is a 59 y.o.  male  with very complex medical problems including severe COPD and chronic cor  pulmonale with pulmonary hypertension, coronary artery disease s/p stenting to mid RCA in 2018, paroxysmal atrial fibrillation with RVR, hypertensive cardiomyopathy, dietary and medication noncompliance, end-stage renal disease on hemodialysis, chronic hepatitis B and C with cirrhosis of the liver and ascites, tobacco use, smokes 1/2 ppd, h/o marijuana use and prior cocaine use quit in 2017. Not on anticoagulation due to cirrhosis of liver and compliance with Warfarin.  He has had multiple ER visits for A fib with RVR, syncope, shortness of breath and hyperkalemia.  He has also been in the hospital with acute respiratory distress and pulmonary edema due to missed dialysis.   Presents today for 6-week follow-up on atrial fibrillation and CHF.  He walked no clinic yesterday and wanted to stop metoprolol tartrate as she could not tolerate this and I will switch him to metoprolol succinate which he is about to pick up today.  He requests Zofran for as needed use for nausea which are prescribed.  He also states that he is out of one of the blood pressure medications on and does not recollect which.  I think it is diltiazem but he will call us back.  There are currently no clinical signs of heart failure and patient is in atrial fibrillation and is currently well rate controlled.  He is presently taking amiodarone, diltiazem and will be starting metoprolol succinate from tartrate switched yesterday.  Follow-up in 6 weeks for atrial fibrillation, CHF. Adrian Prows, PA-C 05/02/2020, 9:51 AM Office: [phone]

## 2020-05-05 ENCOUNTER — Ambulatory Visit: Payer: Medicare Other | Admitting: Family Medicine

## 2020-05-19 ENCOUNTER — Inpatient Hospital Stay (HOSPITAL_COMMUNITY)
Admission: EM | Admit: 2020-05-19 | Discharge: 2020-05-22 | DRG: 640 | Disposition: A | Discharge status: Home / Self Care | Payer: Medicare Other | Attending: Family Medicine | Admitting: Family Medicine

## 2020-05-19 ENCOUNTER — Emergency Department (HOSPITAL_COMMUNITY): Payer: Medicare Other

## 2020-05-19 DIAGNOSIS — I480 Paroxysmal atrial fibrillation: Secondary | ICD-10-CM | POA: Diagnosis present

## 2020-05-19 DIAGNOSIS — R188 Other ascites: Secondary | ICD-10-CM | POA: Diagnosis present

## 2020-05-19 DIAGNOSIS — N186 End stage renal disease: Secondary | ICD-10-CM | POA: Diagnosis present

## 2020-05-19 DIAGNOSIS — Z20822 Contact with and (suspected) exposure to covid-19: Secondary | ICD-10-CM | POA: Diagnosis present

## 2020-05-19 DIAGNOSIS — I120 Hypertensive chronic kidney disease with stage 5 chronic kidney disease or end stage renal disease: Secondary | ICD-10-CM | POA: Diagnosis present

## 2020-05-19 DIAGNOSIS — Z825 Family history of asthma and other chronic lower respiratory diseases: Secondary | ICD-10-CM

## 2020-05-19 DIAGNOSIS — I252 Old myocardial infarction: Secondary | ICD-10-CM

## 2020-05-19 DIAGNOSIS — K7469 Other cirrhosis of liver: Secondary | ICD-10-CM | POA: Diagnosis present

## 2020-05-19 DIAGNOSIS — I447 Left bundle-branch block, unspecified: Secondary | ICD-10-CM | POA: Diagnosis present

## 2020-05-19 DIAGNOSIS — Z9111 Patient's noncompliance with dietary regimen: Secondary | ICD-10-CM | POA: Diagnosis not present

## 2020-05-19 DIAGNOSIS — F17210 Nicotine dependence, cigarettes, uncomplicated: Secondary | ICD-10-CM | POA: Diagnosis present

## 2020-05-19 DIAGNOSIS — I2510 Atherosclerotic heart disease of native coronary artery without angina pectoris: Secondary | ICD-10-CM | POA: Diagnosis present

## 2020-05-19 DIAGNOSIS — Z9115 Patient's noncompliance with renal dialysis: Secondary | ICD-10-CM | POA: Diagnosis not present

## 2020-05-19 DIAGNOSIS — E785 Hyperlipidemia, unspecified: Secondary | ICD-10-CM | POA: Diagnosis present

## 2020-05-19 DIAGNOSIS — R109 Unspecified abdominal pain: Secondary | ICD-10-CM

## 2020-05-19 DIAGNOSIS — G894 Chronic pain syndrome: Secondary | ICD-10-CM | POA: Diagnosis present

## 2020-05-19 DIAGNOSIS — Z992 Dependence on renal dialysis: Secondary | ICD-10-CM

## 2020-05-19 DIAGNOSIS — J449 Chronic obstructive pulmonary disease, unspecified: Secondary | ICD-10-CM | POA: Diagnosis present

## 2020-05-19 DIAGNOSIS — B181 Chronic viral hepatitis B without delta-agent: Secondary | ICD-10-CM | POA: Diagnosis present

## 2020-05-19 DIAGNOSIS — I272 Pulmonary hypertension, unspecified: Secondary | ICD-10-CM | POA: Diagnosis present

## 2020-05-19 DIAGNOSIS — I4891 Unspecified atrial fibrillation: Secondary | ICD-10-CM | POA: Diagnosis not present

## 2020-05-19 DIAGNOSIS — E875 Hyperkalemia: Secondary | ICD-10-CM | POA: Diagnosis present

## 2020-05-19 DIAGNOSIS — N2581 Secondary hyperparathyroidism of renal origin: Secondary | ICD-10-CM | POA: Diagnosis present

## 2020-05-19 DIAGNOSIS — B182 Chronic viral hepatitis C: Secondary | ICD-10-CM | POA: Diagnosis present

## 2020-05-19 DIAGNOSIS — Z79899 Other long term (current) drug therapy: Secondary | ICD-10-CM

## 2020-05-19 DIAGNOSIS — D631 Anemia in chronic kidney disease: Secondary | ICD-10-CM | POA: Diagnosis present

## 2020-05-19 DIAGNOSIS — R000 Tachycardia, unspecified: Secondary | ICD-10-CM | POA: Diagnosis present

## 2020-05-19 DIAGNOSIS — Z801 Family history of malignant neoplasm of trachea, bronchus and lung: Secondary | ICD-10-CM

## 2020-05-19 DIAGNOSIS — Z800 Family history of malignant neoplasm of digestive organs: Secondary | ICD-10-CM

## 2020-05-19 DIAGNOSIS — Z808 Family history of malignant neoplasm of other organs or systems: Secondary | ICD-10-CM

## 2020-05-19 DIAGNOSIS — Z86010 Personal history of colonic polyps: Secondary | ICD-10-CM | POA: Diagnosis not present

## 2020-05-19 DIAGNOSIS — Z8249 Family history of ischemic heart disease and other diseases of the circulatory system: Secondary | ICD-10-CM

## 2020-05-19 LAB — CBC
HCT: 33 % — ABNORMAL LOW (ref 39.0–52.0)
Hemoglobin: 10.8 g/dL — ABNORMAL LOW (ref 13.0–17.0)
MCH: 27.8 pg (ref 26.0–34.0)
MCHC: 32.7 g/dL (ref 30.0–36.0)
MCV: 84.8 fL (ref 80.0–100.0)
Platelets: 136 10*3/uL — ABNORMAL LOW (ref 150–400)
RBC: 3.89 MIL/uL — ABNORMAL LOW (ref 4.22–5.81)
RDW: 17.9 % — ABNORMAL HIGH (ref 11.5–15.5)
WBC: 8.7 10*3/uL (ref 4.0–10.5)
nRBC: 0 % (ref 0.0–0.2)

## 2020-05-19 LAB — COMPREHENSIVE METABOLIC PANEL
ALT: 32 U/L (ref 0–44)
AST: 50 U/L — ABNORMAL HIGH (ref 15–41)
Albumin: 3.6 g/dL (ref 3.5–5.0)
Alkaline Phosphatase: 243 U/L — ABNORMAL HIGH (ref 38–126)
Anion gap: 14 (ref 5–15)
BUN: 50 mg/dL — ABNORMAL HIGH (ref 6–20)
CO2: 26 mmol/L (ref 22–32)
Calcium: 10.4 mg/dL — ABNORMAL HIGH (ref 8.9–10.3)
Chloride: 96 mmol/L — ABNORMAL LOW (ref 98–111)
Creatinine, Ser: 9.54 mg/dL — ABNORMAL HIGH (ref 0.61–1.24)
GFR, Estimated: 6 mL/min — ABNORMAL LOW (ref >60)
Glucose, Bld: 86 mg/dL (ref 70–99)
Potassium: 6.9 mmol/L (ref 3.5–5.1)
Sodium: 136 mmol/L (ref 135–145)
Total Bilirubin: 1 mg/dL (ref 0.3–1.2)
Total Protein: 7.6 g/dL (ref 6.5–8.1)

## 2020-05-19 LAB — BASIC METABOLIC PANEL
Anion gap: 14 (ref 5–15)
BUN: 56 mg/dL — ABNORMAL HIGH (ref 6–20)
CO2: 24 mmol/L (ref 22–32)
Calcium: 10.6 mg/dL — ABNORMAL HIGH (ref 8.9–10.3)
Chloride: 95 mmol/L — ABNORMAL LOW (ref 98–111)
Creatinine, Ser: 9.88 mg/dL — ABNORMAL HIGH (ref 0.61–1.24)
GFR, Estimated: 6 mL/min — ABNORMAL LOW (ref >60)
Glucose, Bld: 103 mg/dL — ABNORMAL HIGH (ref 70–99)
Potassium: 6.6 mmol/L (ref 3.5–5.1)
Sodium: 133 mmol/L — ABNORMAL LOW (ref 135–145)

## 2020-05-19 LAB — RESPIRATORY PANEL BY RT PCR (FLU A&B, COVID)
Influenza A by PCR: NEGATIVE
Influenza B by PCR: NEGATIVE
SARS Coronavirus 2 by RT PCR: NEGATIVE

## 2020-05-19 LAB — CBG MONITORING, ED: Glucose-Capillary: 80 mg/dL (ref 70–99)

## 2020-05-19 LAB — MAGNESIUM: Magnesium: 2.6 mg/dL — ABNORMAL HIGH (ref 1.7–2.4)

## 2020-05-19 MED ORDER — SODIUM CHLORIDE 0.9 % IV SOLN
1.0000 g | Freq: Once | INTRAVENOUS | Status: AC
  Administered 2020-05-19: 1 g via INTRAVENOUS
  Filled 2020-05-19: qty 10

## 2020-05-19 MED ORDER — LIDOCAINE-PRILOCAINE 2.5-2.5 % EX CREA
1.0000 "application " | TOPICAL_CREAM | CUTANEOUS | Status: DC | PRN
  Filled 2020-05-19: qty 5

## 2020-05-19 MED ORDER — METOPROLOL TARTRATE 5 MG/5ML IV SOLN
5.0000 mg | Freq: Once | INTRAVENOUS | Status: DC
  Filled 2020-05-19: qty 5

## 2020-05-19 MED ORDER — ALTEPLASE 2 MG IJ SOLR
2.0000 mg | Freq: Once | INTRAMUSCULAR | Status: DC | PRN
  Filled 2020-05-19: qty 2

## 2020-05-19 MED ORDER — METOPROLOL SUCCINATE ER 25 MG PO TB24: 25.0000 mg | ORAL_TABLET | Freq: Every day | ORAL | Status: DC

## 2020-05-19 MED ORDER — LORAZEPAM 2 MG/ML IJ SOLN
1.0000 mg | Freq: Once | INTRAMUSCULAR | Status: DC
  Filled 2020-05-19: qty 1

## 2020-05-19 MED ORDER — OXYCODONE HCL 5 MG PO TABS
10.0000 mg | ORAL_TABLET | Freq: Three times a day (TID) | ORAL | Status: DC | PRN
  Administered 2020-05-20 – 2020-05-22 (×7): 10 mg via ORAL
  Filled 2020-05-19 (×7): qty 2

## 2020-05-19 MED ORDER — DILTIAZEM HCL ER COATED BEADS 120 MG PO CP24: 120.0000 mg | ORAL_CAPSULE | Freq: Every day | ORAL | Status: DC

## 2020-05-19 MED ORDER — CHLORHEXIDINE GLUCONATE CLOTH 2 % EX PADS
6.0000 | MEDICATED_PAD | Freq: Every day | CUTANEOUS | Status: DC
  Administered 2020-05-21 – 2020-05-22 (×2): 6 via TOPICAL

## 2020-05-19 MED ORDER — HEPARIN SODIUM (PORCINE) 1000 UNIT/ML DIALYSIS
1000.0000 [IU] | INTRAMUSCULAR | Status: DC | PRN
  Filled 2020-05-19: qty 1

## 2020-05-19 MED ORDER — SODIUM CHLORIDE 0.9 % IV SOLN: 100.0000 mL | INTRAVENOUS | Status: DC | PRN

## 2020-05-19 MED ORDER — GABAPENTIN 300 MG PO CAPS
300.0000 mg | ORAL_CAPSULE | Freq: Three times a day (TID) | ORAL | Status: DC
  Administered 2020-05-19: 300 mg via ORAL
  Filled 2020-05-19: qty 1

## 2020-05-19 MED ORDER — PANTOPRAZOLE SODIUM 40 MG PO TBEC
40.0000 mg | DELAYED_RELEASE_TABLET | Freq: Two times a day (BID) | ORAL | Status: DC
  Administered 2020-05-19 – 2020-05-22 (×6): 40 mg via ORAL
  Filled 2020-05-19 (×6): qty 1

## 2020-05-19 MED ORDER — PENTAFLUOROPROP-TETRAFLUOROETH EX AERO
1.0000 "application " | INHALATION_SPRAY | CUTANEOUS | Status: DC | PRN
  Filled 2020-05-19: qty 30

## 2020-05-19 MED ORDER — INSULIN ASPART 100 UNIT/ML IV SOLN
5.0000 [IU] | Freq: Once | INTRAVENOUS | Status: AC
  Administered 2020-05-19: 5 [IU] via INTRAVENOUS

## 2020-05-19 MED ORDER — HEPARIN SODIUM (PORCINE) 5000 UNIT/ML IJ SOLN
5000.0000 [IU] | Freq: Three times a day (TID) | INTRAMUSCULAR | Status: DC
  Administered 2020-05-20: 5000 [IU] via SUBCUTANEOUS
  Filled 2020-05-19 (×3): qty 1

## 2020-05-19 MED ORDER — ALBUTEROL SULFATE HFA 108 (90 BASE) MCG/ACT IN AERS
2.0000 | INHALATION_SPRAY | RESPIRATORY_TRACT | Status: DC | PRN
  Administered 2020-05-20 (×2): 2 via RESPIRATORY_TRACT
  Filled 2020-05-19: qty 6.7

## 2020-05-19 MED ORDER — CINACALCET HCL 30 MG PO TABS
30.0000 mg | ORAL_TABLET | ORAL | Status: DC
  Administered 2020-05-19 – 2020-05-21 (×2): 30 mg via ORAL
  Filled 2020-05-19 (×2): qty 1

## 2020-05-19 MED ORDER — LIDOCAINE HCL (PF) 1 % IJ SOLN: 5.0000 mL | INTRAMUSCULAR | Status: DC | PRN

## 2020-05-19 MED ORDER — TENOFOVIR DISOPROXIL FUMARATE 300 MG PO TABS
300.0000 mg | ORAL_TABLET | ORAL | Status: DC
  Filled 2020-05-19 (×3): qty 1

## 2020-05-19 MED ORDER — SEVELAMER CARBONATE 800 MG PO TABS
2400.0000 mg | ORAL_TABLET | Freq: Three times a day (TID) | ORAL | Status: DC
  Administered 2020-05-20 – 2020-05-22 (×6): 2400 mg via ORAL
  Filled 2020-05-19 (×8): qty 3

## 2020-05-19 MED ORDER — DEXTROSE 50 % IV SOLN
1.0000 | Freq: Once | INTRAVENOUS | Status: AC
  Administered 2020-05-19: 50 mL via INTRAVENOUS
  Filled 2020-05-19: qty 50

## 2020-05-19 MED ORDER — CALCITRIOL 0.25 MCG PO CAPS
1.2500 ug | ORAL_CAPSULE | ORAL | Status: DC
  Administered 2020-05-19: 1.25 ug via ORAL
  Filled 2020-05-19: qty 5
  Filled 2020-05-19: qty 1

## 2020-05-19 MED ORDER — OXYCODONE HCL 5 MG PO TABS: 5.0000 mg | ORAL_TABLET | Freq: Three times a day (TID) | ORAL | Status: DC | PRN

## 2020-05-19 NOTE — ED Triage Notes (Signed)
Pt here with c/o abd pain and some slight sob went to dialysis on Friday , has dialysis today at 11 m pt placed on 2 liters for comfort sats 98% on room air

## 2020-05-19 NOTE — ED Notes (Signed)
Pt to dialysis.

## 2020-05-19 NOTE — H&P (Addendum)
[facility] Admission History and Physical Service Pager: [phone]  Patient name: [name] Medical record number: [id number] Date of birth: 10/26/1960 Age: 59 y.o. Gender: male  Primary Care Provider: Patient, No Pcp Per Consultants: Nephro Code Status: FULL Preferred Emergency Contact:  Contact Information    Name Relation Home Work [place] Sister [phone]  [phone]     Chief Complaint: Dyspnea  Assessment and Plan: [name] is a 59 y.o. male presenting with shortness of breath and found to have hyperkalemia with EKG changes. PMH is significant for ESRD on HD M/W/F, PAF with RVR, HTN, HLD, pulmonary HTN, COPD, tobacco use, Hep B/C, Cirrhosis with recurrent ascites, CAD, anemia of chronic disease and dietary/medication noncompliance.  Hyperkalemia with EKG changes  Hx of ESRD on HD M/W/F Patient presented with 1 day of shortness of breath. Potassium found to be 6.9 on arrival to the ED with peaked T waves on EKG. Patient was initially tachycardic up to 130 but all other vitals stable and his tachycardia has now resolved. He was given calcium chloride 1g in the ED as well as insulin/dextrose. Patient states he was unable to tolerate his full dialysis session on Friday (ran 3 of 4 hours) but otherwise has not missed any sessions. Of note, patient had 6 admissions between July and August 2021 for volume overload, a-fib with RVR, and hyperkalemia. He has a long hx of dietary and medication noncompliance. -Admit to [facility], attending Dr. Ardelia Mems -Telemetry, Vitals per routine -OOB with assistance, Renal diet, 1215mL fluid restriction -Nephrology consulted, appreciate recommendations -Plan for dialysis today -s/p Insulin and Dextrose in the ED -Repeat BMP this afternoon -PT/OT eval and treat - holding home Bactrim in setting of Hyperkalemia   Dyspnea  Hx of COPD, Tobacco Use Patient presenting with 1 day  of dyspnea and cough productive of clear sputum. Denies fever, chills, sick contacts, orthopnea, leg swelling, or chest pain. In the ED he is saturating >95% on room air, breathing comfortably, and has mild bibasilar crackles. CXR shows mild vascular congestion and cardiomegaly. Etiology of patient's dyspnea likely multifactorial 2/2 volume overload (hx ESRD and cirrhosis) and underlying COPD with ongoing tobacco use. Patient states he ran out of his albuterol inhaler. Most recent echo 02/05/2020 showed EF 50-55% with severe LV hypertrophy, unable to assess diastolic function. -Monitor respiratory status -Albuterol inhaler q4h prn -Supplemental O2 as needed, goal SpO2 >88% -Consider repeat echo if not improved with dialysis -Consider abx for possible COPD exacerbation if not improved with dialysis -Strict Is/Os  -Offered nicotine patch, patient declined -PT/OT eval   Cirrhosis with recurrent ascites Chronic, stable. Patient previously received therapeutic paracentesis weekly however has not needed this in a few months. He does have significant abdominal distention on exam today. At home, on bactrim for SBP prophylaxis  -Consider therapeutic paracentesis if SOB not improving with dialysis - holding home Bactrim in setting of Hyperkalemia   Chronic Hepatitis B Hepatitis C, resolved Per Hepatology note on 01/02/20, pt cleared HCV with evidence of chronic HBV. Home meds: Tenofovir 300mg  every Monday after HD although has not taken it for the past 2 weeks.  - restart Tenofivir tonight after HD.   Paroxysmal A-Fib with RVR Patient's EKG on arrival shows a-fib with HR 96 and peaked T waves. Home meds: metoprolol XL 25mg  daily, amiodarone 200mg  daily, and cardizem CD 120mg  daily. Will hold on amiodarone and possibly restart in the AM if improved.  -Med-tele -Continue to  monitor -Continue home meds starting tomorrow am  Anemia of Chronic Disease/ESRD Chronic, stable. Hgb 10.8 on admission. His  baseline is ~10-11. -Daily CBC  Chronic Pain Syndrome Reportedly on oxycodone 10mg  TID prn at home although unclear how often he's taking this. Also takes Gabapentin 300mg  TID. PDMP reviewed and patient fills #90 oxycodone 10 mg q monthly. Follows with Dr. Mee Hives (neurology & pain management). Appears he gets refills from other providers as well. Patient with wheezes on exam, but no other signs of respiratory compromise.  -Continue home Gabapentin  -Continue home oxycodone  FEN/GI: Renal diet, 1276mL fluid restriction Prophylaxis: Heparin  Disposition: Med-tele  History of Present Illness:  [name] is a 59 y.o. male presenting with difficulty breathing that started yesterday (05/18/20). Patient does not have any inhalers at home but does report he uses them twice a day. He denies fevers, chills, sick contacts, chest pain, orthopnea or leg swelling. He does endorse a cough with clear sputum production since yesterday.  Patient reports that last HD session was last Friday and reports good compliance. He reports that he did not have a full HD session on Friday due to getting sick during the session, which patient reports does not happen often. Denies any dietary changes in the last few days.   Pt reports that he lives by himself. He reports medication compliance but does not have any lists of medicaitons. When asked about what medical conditions he has, he reports "I have everything".   Reports active smoker (does not know how much daily) and marijuana use. Denies cocaine use, etoh use.   Review Of Systems: Per HPI with the following additions:   Review of Systems  Constitutional: Positive for fatigue. Negative for appetite change and fever.  HENT: Negative for congestion, rhinorrhea, sinus pressure, sinus pain, sneezing and sore throat.   Eyes: Negative for visual disturbance.  Respiratory: Positive for shortness of breath and wheezing. Negative for cough.   Cardiovascular:  Negative for chest pain and leg swelling.  Gastrointestinal: Positive for abdominal distention, nausea and vomiting. Negative for abdominal pain, constipation and diarrhea.  Musculoskeletal: Negative for back pain.  Neurological: Negative for dizziness, syncope, speech difficulty, weakness, light-headedness and headaches.    Patient Active Problem List   Diagnosis Date Noted  . DNR (do not resuscitate)   . CAP (community acquired pneumonia) 02/09/2020  . Leukocytosis 01/19/2020  . Tobacco use 01/19/2020  . Acute pulmonary edema ([place]) 12/21/2019  . Acetaminophen overdose 10/27/2019  . ESRD (end stage renal disease) ([place])   . Goals of care, counseling/discussion   . Hypoxia 10/04/2019  . Advanced care planning/counseling discussion   . Renal failure 09/26/2019  . Dyspnea 06/09/2019  . Acquired thrombophilia ([place]) 06/05/2019  . A-fib ([place]) 05/30/2019  . Atrial fibrillation with RVR ([place]) 05/29/2019  . Melena   . Pressure injury of skin 03/09/2019  . Abdominal distention   . Volume overload 12/28/2018  . Sepsis ([place]) 09/12/2018  . Coronary artery disease involving native coronary artery of native heart without angina pectoris 03/11/2018  . Benign neoplasm of cecum   . Benign neoplasm of ascending colon   . Benign neoplasm of descending colon   . Benign neoplasm of rectum   . AF (paroxysmal atrial fibrillation) ([place]) 01/23/2018  . Hx of colonic polyps 01/20/2018  . End-stage renal disease on hemodialysis ([place]) 11/21/2017  . GERD (gastroesophageal reflux disease) 11/16/2017  . Decompensated hepatic cirrhosis ([place]) 11/15/2017  . Palliative care by specialist   .  Hyponatremia 11/04/2017  . SBP (spontaneous bacterial peritonitis) ([place]) 10/30/2017  . Liver disease, chronic 10/30/2017  . SOB (shortness of breath)   . Abdominal pain 10/28/2017  . Upper airway cough syndrome with flattening on f/v loop 10/13/17 c/w vcd 10/17/2017  . Elevated diaphragm 10/13/2017  . Ileus ([place])  09/29/2017  . QT prolongation 09/29/2017  . Malnutrition of moderate degree 09/29/2017  . Sinus congestion 09/03/2017  . Symptomatic anemia 09/02/2017  . Cirrhosis of liver with ascites ([place]) 09/02/2017  . Left bundle branch block 09/02/2017  . Mitral stenosis 09/02/2017  . Hematochezia 07/15/2017  . Wide-complex tachycardia ([place])   . Endotracheally intubated   . ESRD on dialysis ([place]) 07/04/2017  . CKD (chronic kidney disease) stage V requiring chronic dialysis ([place]) 06/18/2017  . History of Cocaine abuse ([place]) 06/18/2017  . Hypertension 06/18/2017  . Infection of AV graft for dialysis ([place]) 06/18/2017  . Anxiety 06/18/2017  . Anemia due to chronic kidney disease 06/18/2017  . Atypical atrial flutter ([place]) 06/18/2017  . Personality disorder ([place]) 06/13/2017  . Cellulitis 06/12/2017  . Adjustment disorder with mixed anxiety and depressed mood 06/10/2017  . Suicidal ideation 06/10/2017  . Arm wound, left, sequela 06/10/2017  . Dyspnea on exertion 05/29/2017  . Tachycardia 05/29/2017  . Hyperkalemia 05/22/2017  . Acute metabolic encephalopathy   . Anemia 04/23/2017  . Ascites 04/23/2017  . COPD (chronic obstructive pulmonary disease) ([place]) 04/23/2017  . Acute on chronic respiratory failure with hypoxia ([place]) 03/25/2017  . Arrhythmia 03/25/2017  . COPD GOLD 0 with flattening on inps f/v  09/27/2016  . Essential hypertension 09/27/2016  . Fluid overload 08/30/2016  . COPD exacerbation ([place]) 08/17/2016  . Hypertensive urgency 08/17/2016  . Problem with dialysis access ([place]) 07/23/2016  . Chronic hepatitis B ([place]) 03/05/2014  . Chronic hepatitis C without hepatic coma ([place]) 03/05/2014  . Internal hemorrhoids with bleeding, swelling and itching 03/05/2014  . Thrombocytopenia ([place]) 03/05/2014  . Chest pain 02/27/2014  . Alcohol abuse 04/14/2009  . Nicotine dependence, cigarettes, uncomplicated 02/58/5277  . GANGLION CYST 04/14/2009    Past Medical History: Past Medical  History:  Diagnosis Date  . Anemia   . Anxiety   . Arthritis    left shoulder  . Atherosclerosis of aorta ([place])   . Cardiomegaly   . Chest pain    DATE UNKNOWN, C/O PERIODICALLY  . Cocaine abuse ([place])   . COPD exacerbation ([place]) 08/17/2016  . Coronary artery disease    stent 02/22/17  . ESRD (end stage renal disease) on dialysis ([place])    "[name]; MWF" (07/04/2017)  . GERD (gastroesophageal reflux disease)    DATE UNKNOWN  . Hemorrhoids   . Hepatitis B, chronic ([place])   . Hepatitis C   . History of kidney stones   . Hyperkalemia   . Hypertension   . Kidney failure   . Metabolic bone disease    Patient denies  . Mitral stenosis   . Myocardial infarction ([place])   . Pneumonia   . Pulmonary edema   . Solitary rectal ulcer syndrome 07/2017   at flex sig for rectal bleeding  . Tubular adenoma of colon     Past Surgical History: Past Surgical History:  Procedure Laterality Date  . A/V FISTULAGRAM Left 05/26/2017   Procedure: A/V FISTULAGRAM;  Surgeon: Conrad [place], MD;  Location: [facility];  Service: Cardiovascular;  Laterality: Left;  . A/V FISTULAGRAM Right 11/18/2017   Procedure: A/V FISTULAGRAM - Right Arm;  Surgeon: Elam Dutch, MD;  Location: [facility];  Service: Cardiovascular;  Laterality: Right;  . APPLICATION OF WOUND VAC Left 06/14/2017   Procedure: APPLICATION OF WOUND VAC;  Surgeon: Katha Cabal, MD;  Location: [facility];  Service: Vascular;  Laterality: Left;  . AV FISTULA PLACEMENT  2012   BELIEVED WAS PLACED IN JUNE  . AV FISTULA PLACEMENT Right 08/09/2017   Procedure: Creation Right arm ARTERIOVENOUS BRACHIOCEPOHALIC FISTULA;  Surgeon: Elam Dutch, MD;  Location: [facility] OR;  Service: Vascular;  Laterality: Right;  . AV FISTULA PLACEMENT Right 11/22/2017   Procedure: INSERTION OF ARTERIOVENOUS (AV) GORE-TEX GRAFT RIGHT UPPER ARM;  Surgeon: Elam Dutch, MD;  Location: [facility];  Service: Vascular;  Laterality: Right;  . BIOPSY   01/25/2018   Procedure: BIOPSY;  Surgeon: Jerene Bears, MD;  Location: [facility];  Service: Gastroenterology;;  . BIOPSY  04/10/2019   Procedure: BIOPSY;  Surgeon: Jerene Bears, MD;  Location: [facility];  Service: Gastroenterology;;  . COLONOSCOPY    . COLONOSCOPY WITH PROPOFOL N/A 01/25/2018   Procedure: COLONOSCOPY WITH PROPOFOL;  Surgeon: Jerene Bears, MD;  Location: [facility];  Service: Gastroenterology;  Laterality: N/A;  . CORONARY STENT INTERVENTION N/A 02/22/2017   Procedure: CORONARY STENT INTERVENTION;  Surgeon: Nigel Mormon, MD;  Location: [facility];  Service: Cardiovascular;  Laterality: N/A;  . ESOPHAGOGASTRODUODENOSCOPY (EGD) WITH PROPOFOL N/A 01/25/2018   Procedure: ESOPHAGOGASTRODUODENOSCOPY (EGD) WITH PROPOFOL;  Surgeon: Jerene Bears, MD;  Location: [facility];  Service: Gastroenterology;  Laterality: N/A;  . ESOPHAGOGASTRODUODENOSCOPY (EGD) WITH PROPOFOL N/A 04/10/2019   Procedure: ESOPHAGOGASTRODUODENOSCOPY (EGD) WITH PROPOFOL;  Surgeon: Jerene Bears, MD;  Location: [facility];  Service: Gastroenterology;  Laterality: N/A;  . FLEXIBLE SIGMOIDOSCOPY N/A 07/15/2017   Procedure: FLEXIBLE SIGMOIDOSCOPY;  Surgeon: Carol Ada, MD;  Location: [facility];  Service: Endoscopy;  Laterality: N/A;  . HEMORRHOID BANDING    . I & D EXTREMITY Left 06/01/2017   Procedure: IRRIGATION AND DEBRIDEMENT LEFT ARM HEMATOMA WITH LIGATION OF LEFT ARM AV FISTULA;  Surgeon: Elam Dutch, MD;  Location: [facility];  Service: Vascular;  Laterality: Left;  . I & D EXTREMITY Left 06/14/2017   Procedure: IRRIGATION AND DEBRIDEMENT EXTREMITY;  Surgeon: Katha Cabal, MD;  Location: [facility];  Service: Vascular;  Laterality: Left;  . INSERTION OF DIALYSIS CATHETER  05/30/2017  . INSERTION OF DIALYSIS CATHETER N/A 05/30/2017   Procedure: INSERTION OF DIALYSIS CATHETER;  Surgeon: Elam Dutch, MD;  Location: [facility];  Service: Vascular;  Laterality: N/A;  . IR  PARACENTESIS  08/30/2017  . IR PARACENTESIS  09/29/2017  . IR PARACENTESIS  10/28/2017  . IR PARACENTESIS  11/09/2017  . IR PARACENTESIS  11/16/2017  . IR PARACENTESIS  11/28/2017  . IR PARACENTESIS  12/01/2017  . IR PARACENTESIS  12/06/2017  . IR PARACENTESIS  01/03/2018  . IR PARACENTESIS  01/23/2018  . IR PARACENTESIS  02/07/2018  . IR PARACENTESIS  02/21/2018  . IR PARACENTESIS  03/06/2018  . IR PARACENTESIS  03/17/2018  . IR PARACENTESIS  04/04/2018  . IR PARACENTESIS  12/28/2018  . IR PARACENTESIS  01/08/2019  . IR PARACENTESIS  01/23/2019  . IR PARACENTESIS  02/01/2019  . IR PARACENTESIS  02/19/2019  . IR PARACENTESIS  03/01/2019  . IR PARACENTESIS  03/15/2019  . IR PARACENTESIS  04/03/2019  . IR PARACENTESIS  04/12/2019  . IR PARACENTESIS  05/01/2019  . IR PARACENTESIS  05/08/2019  .  IR PARACENTESIS  05/24/2019  . IR PARACENTESIS  06/12/2019  . IR PARACENTESIS  07/09/2019  . IR PARACENTESIS  07/27/2019  . IR PARACENTESIS  08/09/2019  . IR PARACENTESIS  08/21/2019  . IR PARACENTESIS  09/17/2019  . IR PARACENTESIS  10/05/2019  . IR PARACENTESIS  10/29/2019  . IR PARACENTESIS  11/08/2019  . IR PARACENTESIS  12/12/2019  . IR PARACENTESIS  01/03/2020  . IR PARACENTESIS  01/10/2020  . IR PARACENTESIS  01/17/2020  . IR PARACENTESIS  01/24/2020  . IR PARACENTESIS  01/31/2020  . IR PARACENTESIS  02/07/2020  . IR PARACENTESIS  02/21/2020  . IR RADIOLOGIST EVAL & MGMT  02/14/2018  . IR RADIOLOGIST EVAL & MGMT  02/22/2019  . LEFT HEART CATH AND CORONARY ANGIOGRAPHY N/A 02/22/2017   Procedure: LEFT HEART CATH AND CORONARY ANGIOGRAPHY;  Surgeon: Nigel Mormon, MD;  Location: [facility];  Service: Cardiovascular;  Laterality: N/A;  . LIGATION OF ARTERIOVENOUS  FISTULA Left 01/11/7105   Procedure: Plication of Left Arm Arteriovenous Fistula;  Surgeon: Elam Dutch, MD;  Location: [facility];  Service: Vascular;  Laterality: Left;  . POLYPECTOMY    . POLYPECTOMY  01/25/2018   Procedure: POLYPECTOMY;  Surgeon:  Jerene Bears, MD;  Location: [facility];  Service: Gastroenterology;;  . REVISON OF ARTERIOVENOUS FISTULA Left 2/69/4854   Procedure: PLICATION OF DISTAL ANEURYSMAL SEGEMENT OF LEFT UPPER ARM ARTERIOVENOUS FISTULA;  Surgeon: Elam Dutch, MD;  Location: [facility];  Service: Vascular;  Laterality: Left;  . REVISON OF ARTERIOVENOUS FISTULA Left 01/05/349   Procedure: Plication of Left Upper Arm Fistula ;  Surgeon: Waynetta Sandy, MD;  Location: [facility];  Service: Vascular;  Laterality: Left;  . SKIN GRAFT SPLIT THICKNESS LEG / FOOT Left    SKIN GRAFT SPLIT THICKNESS LEFT ARM DONOR SITE: LEFT ANTERIOR THIGH  . SKIN SPLIT GRAFT Left 07/04/2017   Procedure: SKIN GRAFT SPLIT THICKNESS LEFT ARM DONOR SITE: LEFT ANTERIOR THIGH;  Surgeon: Elam Dutch, MD;  Location: [facility];  Service: Vascular;  Laterality: Left;  . THROMBECTOMY W/ EMBOLECTOMY Left 06/05/2017   Procedure: EXPLORATION OF LEFT ARM FOR BLEEDING; OVERSEWED PROXIMAL FISTULA;  Surgeon: Angelia Mould, MD;  Location: [facility];  Service: Vascular;  Laterality: Left;  . WOUND EXPLORATION Left 06/03/2017   Procedure: WOUND EXPLORATION WITH WOUND VAC APPLICATION TO LEFT ARM;  Surgeon: Angelia Mould, MD;  Location: [facility] OR;  Service: Vascular;  Laterality: Left;    Social History: Social History   Tobacco Use  . Smoking status: Current Every Day Smoker    Packs/day: 0.50    Years: 43.00    Pack years: 21.50    Types: Cigarettes    Start date: 08/13/1973  . Smokeless tobacco: Never Used  Vaping Use  . Vaping Use: Never used  Substance Use Topics  . Alcohol use: Not Currently    Comment: quit drinking in 2017  . Drug use: Not Currently    Types: Marijuana, Cocaine    Comment: reports using once every 3 months,  Quit 04-06-2019    Family History: Family History  Problem Relation Age of Onset  . Heart disease Mother   . Lung cancer Mother   . Heart disease Father   . Malignant hyperthermia Father   . COPD  Father   . Throat cancer Sister   . Esophageal cancer Sister   . Hypertension Other   . COPD Other   . Colon cancer Neg Hx   .  Colon polyps Neg Hx   . Rectal cancer Neg Hx   . Stomach cancer Neg Hx     Allergies and Medications: Allergies  Allergen Reactions  . Tramadol Itching and Other (See Comments)  . Morphine And Related Other (See Comments)    Stomach pain  . Pollen Extract Other (See Comments)    Other reaction(s): Sneezing (finding)  . Acetaminophen Nausea Only    Stomach ache   . Aspirin Itching and Other (See Comments)    STOMACH PAIN   . Clonidine Derivatives Itching   No current facility-administered medications on file prior to encounter.   Current Outpatient Medications on File Prior to Encounter  Medication Sig Dispense Refill  . albuterol (VENTOLIN HFA) 108 (90 Base) MCG/ACT inhaler Inhale 2 puffs into the lungs every 4 (four) hours as needed for wheezing or shortness of breath.     Marland Kitchen amiodarone (PACERONE) 200 MG tablet Take 1 tablet (200 mg total) by mouth daily. 30 tablet 1  . diltiazem (CARDIZEM CD) 120 MG 24 hr capsule Take 1 capsule (120 mg total) by mouth daily. 90 capsule 0  . diphenhydrAMINE (BENADRYL) 25 mg capsule Take 1 capsule (25 mg total) by mouth every 6 (six) hours as needed for itching or allergies. 30 capsule 0  . gabapentin (NEURONTIN) 100 MG capsule Take 1 capsule (100 mg total) by mouth 3 (three) times daily. (Patient taking differently: Take 300 mg by mouth 3 (three) times daily. ) 90 capsule 0  . metoprolol succinate (TOPROL XL) 25 MG 24 hr tablet Take 1 tablet (25 mg total) by mouth daily. 30 tablet 2  . ondansetron (ZOFRAN) 4 MG tablet Take 1 tablet (4 mg total) by mouth every 8 (eight) hours as needed for nausea or vomiting. 30 tablet 2  . Oxycodone HCl 10 MG TABS Take 10 mg by mouth 3 (three) times daily as needed (pain).     . pantoprazole (PROTONIX) 40 MG tablet Take 1 tablet (40 mg total) by mouth 2 (two) times daily. 60 tablet 0   . sevelamer carbonate (RENVELA) 800 MG tablet Take 2,400 mg by mouth See admin instructions. Take 3 tablets (2400 mg) by mouth up to three times daily with meals    . tenofovir (VIREAD) 300 MG tablet Take 300 mg by mouth every Monday.       Objective: BP (!) 121/109 (BP Location: Right Leg)   Pulse (!) 134   Temp 98.1 F (36.7 C) (Oral)   Resp (!) 21   Ht 5\' 9"  (1.753 m)   Wt 65.8 kg   SpO2 100%   BMI 21.41 kg/m    Exam: General: alert, NAD, eating vigorously  Eyes: PERRL, significant conjunctival injection bilaterally ENTM: moist mucous membranes, poor dentition Neck: supple Cardiovascular: RRR, normal S1/S2 without m/r/g Respiratory: normal WOB on room air, bilateral expiratory and inspiratory wheezing throughout w/ bibasilar crackles. Generally fair air movement throughout.  Gastrointestinal: +BS, firm, distended, nontender Ext: no peripheral edema Derm: no rashes or lesions noted Neuro: grossly intact  Labs and Imaging: CBC BMET  Recent Labs  Lab 05/19/20 0934  WBC 8.7  HGB 10.8*  HCT 33.0*  PLT 136*   Recent Labs  Lab 05/19/20 0934  NA 136  K 6.9*  CL 96*  CO2 26  BUN 50*  CREATININE 9.54*  GLUCOSE 86  CALCIUM 10.4*     EKG: a-fib at 96 bpm, peaked T waves  Alcus Dad, MD 05/19/2020, 2:06 PM PGY-1, [place]  [place] Intern pager: [phone], text pages welcome  FPTS Upper-Level Resident Addendum I have independently interviewed and examined the patient. I have discussed the above with the original author and agree with their documentation. My edits for correction/addition/clarification are in - purple. Please see also any attending notes.  [place] Service pager: [phone] (text pages welcome through [facility]ION)  Wilber Oliphant, M.D.  PGY-3 05/19/2020 6:01 PM

## 2020-05-19 NOTE — ED Notes (Signed)
Attempted to contact attending about critical lab values but current attending shows to be unavailable. Attempting to re-contact.

## 2020-05-19 NOTE — ED Notes (Signed)
Per PA, metoprolol will be held at present

## 2020-05-19 NOTE — Consult Note (Signed)
[facility]  INPATIENT CONSULTATION  Reason for Consultation: Hyperkalemia, ESRD Requesting Provider: Dr. Gilford Raid  HPI: [name] is an 59 y.o. male with ESRD on HD MWF at [facility], HTN, HCV with cirrhosis, COPD, PAF, chronic pain,medical noncompliance with truncated HD treatments, polysubstance abuse who is seen for evaluation and management of ESRD and related conditions.   Doesn't offer much history except to say he felt "bad" this AM prompting presentation to ED - endorses dyspnea without fevers, cough.  Felt nausea after insulin given for K 6.9 in the ED.  Requesting socks, blanket, food and dialysis currently.  His CXR shows mild vascular congestion. He's afebrile and tachycardic, BPs > 100.   Last dialysis was Friday - ran 3hrs of 4 hrs, post wt 70.8kg.  EDW is 68kg.  Hasn't stayed entire treatment lately  PMH: Past Medical History:  Diagnosis Date  . Anemia   . Anxiety   . Arthritis    left shoulder  . Atherosclerosis of aorta ([place])   . Cardiomegaly   . Chest pain    DATE UNKNOWN, C/O PERIODICALLY  . Cocaine abuse ([place])   . COPD exacerbation ([place]) 08/17/2016  . Coronary artery disease    stent 02/22/17  . ESRD (end stage renal disease) on dialysis ([place])    "[name]; MWF" (07/04/2017)  . GERD (gastroesophageal reflux disease)    DATE UNKNOWN  . Hemorrhoids   . Hepatitis B, chronic ([place])   . Hepatitis C   . History of kidney stones   . Hyperkalemia   . Hypertension   . Kidney failure   . Metabolic bone disease    Patient denies  . Mitral stenosis   . Myocardial infarction ([place])   . Pneumonia   . Pulmonary edema   . Solitary rectal ulcer syndrome 07/2017   at flex sig for rectal bleeding  . Tubular adenoma of colon    PSH: Past Surgical History:  Procedure Laterality Date  . A/V FISTULAGRAM Left 05/26/2017   Procedure: A/V FISTULAGRAM;  Surgeon: Conrad Granite, MD;  Location: [place] [facility];  Service: Cardiovascular;  Laterality:  Left;  . A/V FISTULAGRAM Right 11/18/2017   Procedure: A/V FISTULAGRAM - Right Arm;  Surgeon: Elam Dutch, MD;  Location: [place] [facility];  Service: Cardiovascular;  Laterality: Right;  . APPLICATION OF WOUND VAC Left 06/14/2017   Procedure: APPLICATION OF WOUND VAC;  Surgeon: Katha Cabal, MD;  Location: [facility];  Service: Vascular;  Laterality: Left;  . AV FISTULA PLACEMENT  2012   BELIEVED WAS PLACED IN JUNE  . AV FISTULA PLACEMENT Right 08/09/2017   Procedure: Creation Right arm ARTERIOVENOUS BRACHIOCEPOHALIC FISTULA;  Surgeon: Elam Dutch, MD;  Location: [facility] OR;  Service: Vascular;  Laterality: Right;  . AV FISTULA PLACEMENT Right 11/22/2017   Procedure: INSERTION OF ARTERIOVENOUS (AV) GORE-TEX GRAFT RIGHT UPPER ARM;  Surgeon: Elam Dutch, MD;  Location: [place];  Service: Vascular;  Laterality: Right;  . BIOPSY  01/25/2018   Procedure: BIOPSY;  Surgeon: Jerene Bears, MD;  Location: [place];  Service: Gastroenterology;;  . BIOPSY  04/10/2019   Procedure: BIOPSY;  Surgeon: Jerene Bears, MD;  Location: [facility];  Service: Gastroenterology;;  . COLONOSCOPY    . COLONOSCOPY WITH PROPOFOL N/A 01/25/2018   Procedure: COLONOSCOPY WITH PROPOFOL;  Surgeon: Jerene Bears, MD;  Location: [place];  Service: Gastroenterology;  Laterality: N/A;  . CORONARY STENT INTERVENTION N/A 02/22/2017   Procedure: CORONARY  STENT INTERVENTION;  Surgeon: Nigel Mormon, MD;  Location: [place] [facility];  Service: Cardiovascular;  Laterality: N/A;  . ESOPHAGOGASTRODUODENOSCOPY (EGD) WITH PROPOFOL N/A 01/25/2018   Procedure: ESOPHAGOGASTRODUODENOSCOPY (EGD) WITH PROPOFOL;  Surgeon: Jerene Bears, MD;  Location: [place];  Service: Gastroenterology;  Laterality: N/A;  . ESOPHAGOGASTRODUODENOSCOPY (EGD) WITH PROPOFOL N/A 04/10/2019   Procedure: ESOPHAGOGASTRODUODENOSCOPY (EGD) WITH PROPOFOL;  Surgeon: Jerene Bears, MD;  Location: [facility];  Service: Gastroenterology;   Laterality: N/A;  . FLEXIBLE SIGMOIDOSCOPY N/A 07/15/2017   Procedure: FLEXIBLE SIGMOIDOSCOPY;  Surgeon: Carol Ada, MD;  Location: [place];  Service: Endoscopy;  Laterality: N/A;  . HEMORRHOID BANDING    . I & D EXTREMITY Left 06/01/2017   Procedure: IRRIGATION AND DEBRIDEMENT LEFT ARM HEMATOMA WITH LIGATION OF LEFT ARM AV FISTULA;  Surgeon: Elam Dutch, MD;  Location: [place];  Service: Vascular;  Laterality: Left;  . I & D EXTREMITY Left 06/14/2017   Procedure: IRRIGATION AND DEBRIDEMENT EXTREMITY;  Surgeon: Katha Cabal, MD;  Location: [facility];  Service: Vascular;  Laterality: Left;  . INSERTION OF DIALYSIS CATHETER  05/30/2017  . INSERTION OF DIALYSIS CATHETER N/A 05/30/2017   Procedure: INSERTION OF DIALYSIS CATHETER;  Surgeon: Elam Dutch, MD;  Location: [place];  Service: Vascular;  Laterality: N/A;  . IR PARACENTESIS  08/30/2017  . IR PARACENTESIS  09/29/2017  . IR PARACENTESIS  10/28/2017  . IR PARACENTESIS  11/09/2017  . IR PARACENTESIS  11/16/2017  . IR PARACENTESIS  11/28/2017  . IR PARACENTESIS  12/01/2017  . IR PARACENTESIS  12/06/2017  . IR PARACENTESIS  01/03/2018  . IR PARACENTESIS  01/23/2018  . IR PARACENTESIS  02/07/2018  . IR PARACENTESIS  02/21/2018  . IR PARACENTESIS  03/06/2018  . IR PARACENTESIS  03/17/2018  . IR PARACENTESIS  04/04/2018  . IR PARACENTESIS  12/28/2018  . IR PARACENTESIS  01/08/2019  . IR PARACENTESIS  01/23/2019  . IR PARACENTESIS  02/01/2019  . IR PARACENTESIS  02/19/2019  . IR PARACENTESIS  03/01/2019  . IR PARACENTESIS  03/15/2019  . IR PARACENTESIS  04/03/2019  . IR PARACENTESIS  04/12/2019  . IR PARACENTESIS  05/01/2019  . IR PARACENTESIS  05/08/2019  . IR PARACENTESIS  05/24/2019  . IR PARACENTESIS  06/12/2019  . IR PARACENTESIS  07/09/2019  . IR PARACENTESIS  07/27/2019  . IR PARACENTESIS  08/09/2019  . IR PARACENTESIS  08/21/2019  . IR PARACENTESIS  09/17/2019  . IR PARACENTESIS  10/05/2019  . IR PARACENTESIS  10/29/2019  . IR  PARACENTESIS  11/08/2019  . IR PARACENTESIS  12/12/2019  . IR PARACENTESIS  01/03/2020  . IR PARACENTESIS  01/10/2020  . IR PARACENTESIS  01/17/2020  . IR PARACENTESIS  01/24/2020  . IR PARACENTESIS  01/31/2020  . IR PARACENTESIS  02/07/2020  . IR PARACENTESIS  02/21/2020  . IR RADIOLOGIST EVAL & MGMT  02/14/2018  . IR RADIOLOGIST EVAL & MGMT  02/22/2019  . LEFT HEART CATH AND CORONARY ANGIOGRAPHY N/A 02/22/2017   Procedure: LEFT HEART CATH AND CORONARY ANGIOGRAPHY;  Surgeon: Nigel Mormon, MD;  Location: [place] [facility];  Service: Cardiovascular;  Laterality: N/A;  . LIGATION OF ARTERIOVENOUS  FISTULA Left 0/0/7622   Procedure: Plication of Left Arm Arteriovenous Fistula;  Surgeon: Elam Dutch, MD;  Location: [place];  Service: Vascular;  Laterality: Left;  . POLYPECTOMY    . POLYPECTOMY  01/25/2018   Procedure: POLYPECTOMY;  Surgeon: Jerene Bears, MD;  Location: [facility];  Service: Gastroenterology;;  . REVISON OF ARTERIOVENOUS FISTULA Left 2/35/5732   Procedure: PLICATION OF DISTAL ANEURYSMAL SEGEMENT OF LEFT UPPER ARM ARTERIOVENOUS FISTULA;  Surgeon: Elam Dutch, MD;  Location: [place];  Service: Vascular;  Laterality: Left;  . REVISON OF ARTERIOVENOUS FISTULA Left 08/13/5425   Procedure: Plication of Left Upper Arm Fistula ;  Surgeon: Waynetta Sandy, MD;  Location: [place];  Service: Vascular;  Laterality: Left;  . SKIN GRAFT SPLIT THICKNESS LEG / FOOT Left    SKIN GRAFT SPLIT THICKNESS LEFT ARM DONOR SITE: LEFT ANTERIOR THIGH  . SKIN SPLIT GRAFT Left 07/04/2017   Procedure: SKIN GRAFT SPLIT THICKNESS LEFT ARM DONOR SITE: LEFT ANTERIOR THIGH;  Surgeon: Elam Dutch, MD;  Location: [place];  Service: Vascular;  Laterality: Left;  . THROMBECTOMY W/ EMBOLECTOMY Left 06/05/2017   Procedure: EXPLORATION OF LEFT ARM FOR BLEEDING; OVERSEWED PROXIMAL FISTULA;  Surgeon: Angelia Mould, MD;  Location: [place];  Service: Vascular;  Laterality: Left;  . WOUND EXPLORATION Left  06/03/2017   Procedure: WOUND EXPLORATION WITH WOUND VAC APPLICATION TO LEFT ARM;  Surgeon: Angelia Mould, MD;  Location: [place];  Service: Vascular;  Laterality: Left;    Past Medical History:  Diagnosis Date  . Anemia   . Anxiety   . Arthritis    left shoulder  . Atherosclerosis of aorta ([place])   . Cardiomegaly   . Chest pain    DATE UNKNOWN, C/O PERIODICALLY  . Cocaine abuse ([place])   . COPD exacerbation ([place]) 08/17/2016  . Coronary artery disease    stent 02/22/17  . ESRD (end stage renal disease) on dialysis ([place])    "[name]; MWF" (07/04/2017)  . GERD (gastroesophageal reflux disease)    DATE UNKNOWN  . Hemorrhoids   . Hepatitis B, chronic ([place])   . Hepatitis C   . History of kidney stones   . Hyperkalemia   . Hypertension   . Kidney failure   . Metabolic bone disease    Patient denies  . Mitral stenosis   . Myocardial infarction ([place])   . Pneumonia   . Pulmonary edema   . Solitary rectal ulcer syndrome 07/2017   at flex sig for rectal bleeding  . Tubular adenoma of colon     Medications:  I have reviewed the patient's current medications.  (Not in a hospital admission)   ALLERGIES:   Allergies  Allergen Reactions  . Tramadol Itching and Other (See Comments)  . Morphine And Related Other (See Comments)    Stomach pain  . Pollen Extract Other (See Comments)    Other reaction(s): Sneezing (finding)  . Acetaminophen Nausea Only    Stomach ache   . Aspirin Itching and Other (See Comments)    STOMACH PAIN   . Clonidine Derivatives Itching    FAM HX: Family History  Problem Relation Age of Onset  . Heart disease Mother   . Lung cancer Mother   . Heart disease Father   . Malignant hyperthermia Father   . COPD Father   . Throat cancer Sister   . Esophageal cancer Sister   . Hypertension Other   . COPD Other   . Colon cancer Neg Hx   . Colon polyps Neg Hx   . Rectal cancer Neg Hx   . Stomach cancer Neg Hx     Social History:    reports that he has been smoking cigarettes. He started smoking about 46 years ago.  He has a 21.50 pack-year smoking history. He has never used smokeless tobacco. He reports previous alcohol use. He reports previous drug use. Drugs: Marijuana and Cocaine.  ROS: 12 system ROS per HPI above   Blood pressure (!) 121/109, pulse (!) 134, temperature 98.1 F (36.7 C), temperature source Oral, resp. rate (!) 21, height 5\' 9"  (1.753 m), weight 65.8 kg, SpO2 100 %. PHYSICAL EXAM: Gen: chronically ill appearing, lying flat on L side on stretcher  Eyes: anicteric, EOMI ENT: MMM Neck: JVD to jaw lying down  CV:  Tachy, irregular Abd: soft, mild TTP, mildly distended Lungs: basilar rales GU: no foley Extr:  Trace edema, RUE AVG +T/B Neuro: grossly nonfocal Skin:  Warm and dry   Results for orders placed or performed during the hospital encounter of 05/19/20 (from the past 48 hour(s))  CBC     Status: Abnormal   Collection Time: 05/19/20  9:34 AM  Result Value Ref Range   WBC 8.7 4.0 - 10.5 K/uL   RBC 3.89 (L) 4.22 - 5.81 MIL/uL   Hemoglobin 10.8 (L) 13.0 - 17.0 g/dL   HCT 33.0 (L) 39 - 52 %   MCV 84.8 80.0 - 100.0 fL   MCH 27.8 26.0 - 34.0 pg   MCHC 32.7 30.0 - 36.0 g/dL   RDW 17.9 (H) 11.5 - 15.5 %   Platelets 136 (L) 150 - 400 K/uL   nRBC 0.0 0.0 - 0.2 %    Comment: Performed at [facility], [address], [place], [place] [address]  Comprehensive metabolic panel     Status: Abnormal   Collection Time: 05/19/20  9:34 AM  Result Value Ref Range   Sodium 136 135 - 145 mmol/L   Potassium 6.9 (HH) 3.5 - 5.1 mmol/L    Comment: NO VISIBLE HEMOLYSIS CRITICAL RESULT CALLED TO, READ BACK BY AND VERIFIED WITH: CHILTON,L RN @1038  ON [id number] BY FLEMINGS    Chloride 96 (L) 98 - 111 mmol/L   CO2 26 22 - 32 mmol/L   Glucose, Bld 86 70 - 99 mg/dL    Comment: Glucose reference range applies only to samples taken after fasting for at least 8 hours.   BUN 50 (H) 6 - 20 mg/dL   Creatinine,  Ser 9.54 (H) 0.61 - 1.24 mg/dL   Calcium 10.4 (H) 8.9 - 10.3 mg/dL   Total Protein 7.6 6.5 - 8.1 g/dL   Albumin 3.6 3.5 - 5.0 g/dL   AST 50 (H) 15 - 41 U/L   ALT 32 0 - 44 U/L   Alkaline Phosphatase 243 (H) 38 - 126 U/L   Total Bilirubin 1.0 0.3 - 1.2 mg/dL   GFR, Estimated 6 (L) >60 mL/min    Comment: (NOTE) Calculated using the CKD-EPI Creatinine Equation (2021)    Anion gap 14 5 - 15    Comment: Performed at [facility], [place] [address], [place], [place] [address]  Magnesium     Status: Abnormal   Collection Time: 05/19/20  9:34 AM  Result Value Ref Range   Magnesium 2.6 (H) 1.7 - 2.4 mg/dL    Comment: Performed at [facility] [address], [place],  [address]  POC CBG, ED     Status: None   Collection Time: 05/19/20 12:18 PM  Result Value Ref Range   Glucose-Capillary 80 70 - 99 mg/dL    Comment: Glucose reference range applies only to samples taken after fasting for at least 8 hours.  DG Chest Portable 1 View  Result Date: 05/19/2020 CLINICAL DATA:  Shortness of breath in a dialysis patient. EXAM: PORTABLE CHEST 1 VIEW COMPARISON:  None. FINDINGS: There is cardiomegaly and vascular congestion. No consolidative process, pneumothorax or effusion. Aortic atherosclerosis noted. No acute or focal bony abnormality. IMPRESSION: Cardiomegaly mild vascular congestion. Aortic Atherosclerosis (ICD10-I70.0). Electronically Signed   By: Inge Rise M.D.   On: 05/19/2020 11:53   Dialysis orders: [place] 4hrs, 450 / 800, EDW 68kg 2K/2Ca, AVG mircera 60 q2wks  Last Hb 11 11/3 venofer 50 weekly sensipar 30 TIW, calctriol 1.25 TIW 10/20 phos 4.5 ca 9.5, PTH 781  Assessment/Plan **Hyperkalemia;  Medically managed in the ED, will provide HD after COVID swab results.  Recent K 5.1 at outpt HD.  Encourage tx adherence.  **ESRD on HD:  HD today per above.  Pre/post wt.  UF as BP tolerates - trial of 3.5L.  EDW 68kg but wt here 65.8kg. Encouraged him to run full tx  times  **Anemia:   Hb 10.8.  Follow  **Secondary hyperparathyroidism:  Cont sensipar 30 TIW, calcitriol 1.25 TIW, renvela 2400 TIDAC.  **HCV: on treatment it appears per med list  dispo - being admitted to obs per [place] 05/19/2020, 1:58 PM

## 2020-05-19 NOTE — ED Provider Notes (Signed)
[facility] EMERGENCY DEPARTMENT Provider Note   CSN: [id number] Arrival date & time: 05/19/20  8889     History No chief complaint on file.   [name] is a 59 y.o. male.  HPI [name] is a 59 year old gentleman with past medical history of PAF, ESRD on hemodialysis Monday Wednesday Friday, no longer makes urine, HTN, HLD, tobacco abuse, COPD, hip B/C/cirrhosis, chronic pain, CAD, depression anxiety, anemia of chronic disease/kidney disease, medication noncompliance  [name] presents to the emergency department today for chief complaint of shortness of breath and abdominal pain.  [name] is also requesting food at this time.  [name] states that [name] has been doing his Monday Wednesday Friday dialysis however states that his most recent dialysis on Friday [name] only did the first 3 hours of his session.  [name] states that yesterday evening after [name] ate dinner [name] started having some abdominal cramping pain.  [name] states it was relatively mild but was persistent until [name] went to sleep.  [name] states that [name] also experience some shortness of breath Friday evening which has persisted and worsened since that time.  [name] denies any cough or congestion or any fevers or chills.  States [name] is otherwise feeling relatively well.  [name] is requesting food.  [name] states [name] has had normal appetite.  [name] denies any other associate symptoms.  No aggravating or mitigating factors.  [name] has taken no medications prior to arrival in the ER.  [name] is scheduled for dialysis today at noon however came to the ER because of shortness of breath.    Past Medical History:  Diagnosis Date  . Anemia   . Anxiety   . Arthritis    left shoulder  . Atherosclerosis of aorta ([place])   . Cardiomegaly   . Chest pain    DATE UNKNOWN, C/O PERIODICALLY  . Cocaine abuse ([place])   . COPD exacerbation ([place]) 08/17/2016  . Coronary artery disease    stent 02/22/17  . ESRD (end stage renal disease) on dialysis ([place])    "[name];  MWF" (07/04/2017)  . GERD (gastroesophageal reflux disease)    DATE UNKNOWN  . Hemorrhoids   . Hepatitis B, chronic ([place])   . Hepatitis C   . History of kidney stones   . Hyperkalemia   . Hypertension   . Kidney failure   . Metabolic bone disease    [name] denies  . Mitral stenosis   . Myocardial infarction ([place])   . Pneumonia   . Pulmonary edema   . Solitary rectal ulcer syndrome 07/2017   at flex sig for rectal bleeding  . Tubular adenoma of colon     [name] Active Problem List   Diagnosis Date Noted  . DNR (do not resuscitate)   . CAP (community acquired pneumonia) 02/09/2020  . Leukocytosis 01/19/2020  . Tobacco use 01/19/2020  . Acute pulmonary edema ([place]) 12/21/2019  . Acetaminophen overdose 10/27/2019  . ESRD (end stage renal disease) ([place])   . Goals of care, counseling/discussion   . Hypoxia 10/04/2019  . Advanced care planning/counseling discussion   . Renal failure 09/26/2019  . Dyspnea 06/09/2019  . Acquired thrombophilia ([place]) 06/05/2019  . A-fib ([place]) 05/30/2019  . Atrial fibrillation with RVR ([place]) 05/29/2019  . Melena   . Pressure injury of skin 03/09/2019  . Abdominal distention   . Volume overload 12/28/2018  . Sepsis ([place]) 09/12/2018  . Coronary artery disease involving native coronary artery of native heart without angina pectoris 03/11/2018  .  Benign neoplasm of cecum   . Benign neoplasm of ascending colon   . Benign neoplasm of descending colon   . Benign neoplasm of rectum   . AF (paroxysmal atrial fibrillation) ([place]) 01/23/2018  . Hx of colonic polyps 01/20/2018  . End-stage renal disease on hemodialysis ([place]) 11/21/2017  . GERD (gastroesophageal reflux disease) 11/16/2017  . Decompensated hepatic cirrhosis ([place]) 11/15/2017  . Palliative care by specialist   . Hyponatremia 11/04/2017  . SBP (spontaneous bacterial peritonitis) ([place]) 10/30/2017  . Liver disease, chronic 10/30/2017  . SOB (shortness of breath)   . Abdominal pain  10/28/2017  . Upper airway cough syndrome with flattening on f/v loop 10/13/17 c/w vcd 10/17/2017  . Elevated diaphragm 10/13/2017  . Ileus ([place]) 09/29/2017  . QT prolongation 09/29/2017  . Malnutrition of moderate degree 09/29/2017  . Sinus congestion 09/03/2017  . Symptomatic anemia 09/02/2017  . Cirrhosis of liver with ascites ([place]) 09/02/2017  . Left bundle branch block 09/02/2017  . Mitral stenosis 09/02/2017  . Hematochezia 07/15/2017  . Wide-complex tachycardia ([place])   . Endotracheally intubated   . ESRD on dialysis ([place]) 07/04/2017  . CKD (chronic kidney disease) stage V requiring chronic dialysis ([place]) 06/18/2017  . History of Cocaine abuse ([place]) 06/18/2017  . Hypertension 06/18/2017  . Infection of AV graft for dialysis ([place]) 06/18/2017  . Anxiety 06/18/2017  . Anemia due to chronic kidney disease 06/18/2017  . Atypical atrial flutter ([place]) 06/18/2017  . Personality disorder ([place]) 06/13/2017  . Cellulitis 06/12/2017  . Adjustment disorder with mixed anxiety and depressed mood 06/10/2017  . Suicidal ideation 06/10/2017  . Arm wound, left, sequela 06/10/2017  . Dyspnea on exertion 05/29/2017  . Tachycardia 05/29/2017  . Hyperkalemia 05/22/2017  . Acute metabolic encephalopathy   . Anemia 04/23/2017  . Ascites 04/23/2017  . COPD (chronic obstructive pulmonary disease) ([place]) 04/23/2017  . Acute on chronic respiratory failure with hypoxia ([place]) 03/25/2017  . Arrhythmia 03/25/2017  . COPD GOLD 0 with flattening on inps f/v  09/27/2016  . Essential hypertension 09/27/2016  . Fluid overload 08/30/2016  . COPD exacerbation ([place]) 08/17/2016  . Hypertensive urgency 08/17/2016  . Problem with dialysis access ([place]) 07/23/2016  . Chronic hepatitis B ([place]) 03/05/2014  . Chronic hepatitis C without hepatic coma ([place]) 03/05/2014  . Internal hemorrhoids with bleeding, swelling and itching 03/05/2014  . Thrombocytopenia ([place]) 03/05/2014  . Chest pain 02/27/2014  . Alcohol abuse  04/14/2009  . Nicotine dependence, cigarettes, uncomplicated 44/07/270  . GANGLION CYST 04/14/2009    Past Surgical History:  Procedure Laterality Date  . A/V FISTULAGRAM Left 05/26/2017   Procedure: A/V FISTULAGRAM;  Surgeon: Conrad [place], MD;  Location: [place] [facility];  Service: Cardiovascular;  Laterality: Left;  . A/V FISTULAGRAM Right 11/18/2017   Procedure: A/V FISTULAGRAM - Right Arm;  Surgeon: Elam Dutch, MD;  Location: [place] [facility];  Service: Cardiovascular;  Laterality: Right;  . APPLICATION OF WOUND VAC Left 06/14/2017   Procedure: APPLICATION OF WOUND VAC;  Surgeon: Katha Cabal, MD;  Location: [facility];  Service: Vascular;  Laterality: Left;  . AV FISTULA PLACEMENT  2012   BELIEVED WAS PLACED IN JUNE  . AV FISTULA PLACEMENT Right 08/09/2017   Procedure: Creation Right arm ARTERIOVENOUS BRACHIOCEPOHALIC FISTULA;  Surgeon: Elam Dutch, MD;  [facility];  Service: Vascular;  Laterality: Right;  . AV FISTULA PLACEMENT Right 11/22/2017   Procedure: INSERTION OF ARTERIOVENOUS (AV) GORE-TEX GRAFT RIGHT UPPER ARM;  Surgeon: Oneida Alar,  Jessy Oto, MD;  Location: [facility];  Service: Vascular;  Laterality: Right;  . BIOPSY  01/25/2018   Procedure: BIOPSY;  Surgeon: Jerene Bears, MD;  Location: [place];  Service: Gastroenterology;;  . BIOPSY  04/10/2019   Procedure: BIOPSY;  Surgeon: Jerene Bears, MD;  Location: [facility];  Service: Gastroenterology;;  . COLONOSCOPY    . COLONOSCOPY WITH PROPOFOL N/A 01/25/2018   Procedure: COLONOSCOPY WITH PROPOFOL;  Surgeon: Jerene Bears, MD;  Location: [facility];  Service: Gastroenterology;  Laterality: N/A;  . CORONARY STENT INTERVENTION N/A 02/22/2017   Procedure: CORONARY STENT INTERVENTION;  Surgeon: Nigel Mormon, MD;  Location: [place] [facility];  Service: Cardiovascular;  Laterality: N/A;  . ESOPHAGOGASTRODUODENOSCOPY (EGD) WITH PROPOFOL N/A 01/25/2018   Procedure: ESOPHAGOGASTRODUODENOSCOPY (EGD) WITH  PROPOFOL;  Surgeon: Jerene Bears, MD;  Location: [facility];  Service: Gastroenterology;  Laterality: N/A;  . ESOPHAGOGASTRODUODENOSCOPY (EGD) WITH PROPOFOL N/A 04/10/2019   Procedure: ESOPHAGOGASTRODUODENOSCOPY (EGD) WITH PROPOFOL;  Surgeon: Jerene Bears, MD;  [facility]: [facility];  Service: Gastroenterology;  Laterality: N/A;  . FLEXIBLE SIGMOIDOSCOPY N/A 07/15/2017   Procedure: FLEXIBLE SIGMOIDOSCOPY;  Surgeon: Carol Ada, MD;  Location: [facility];  Service: Endoscopy;  Laterality: N/A;  . HEMORRHOID BANDING    . I & D EXTREMITY Left 06/01/2017   Procedure: IRRIGATION AND DEBRIDEMENT LEFT ARM HEMATOMA WITH LIGATION OF LEFT ARM AV FISTULA;  Surgeon: Elam Dutch, MD;  Location: [facility];  Service: Vascular;  Laterality: Left;  . I & D EXTREMITY Left 06/14/2017   Procedure: IRRIGATION AND DEBRIDEMENT EXTREMITY;  Surgeon: Katha Cabal, MD;  Location: [facility];  Service: Vascular;  Laterality: Left;  . INSERTION OF DIALYSIS CATHETER  05/30/2017  . INSERTION OF DIALYSIS CATHETER N/A 05/30/2017   Procedure: INSERTION OF DIALYSIS CATHETER;  Surgeon: Elam Dutch, MD;  Location: [facility];  Service: Vascular;  Laterality: N/A;  . IR PARACENTESIS  08/30/2017  . IR PARACENTESIS  09/29/2017  . IR PARACENTESIS  10/28/2017  . IR PARACENTESIS  11/09/2017  . IR PARACENTESIS  11/16/2017  . IR PARACENTESIS  11/28/2017  . IR PARACENTESIS  12/01/2017  . IR PARACENTESIS  12/06/2017  . IR PARACENTESIS  01/03/2018  . IR PARACENTESIS  01/23/2018  . IR PARACENTESIS  02/07/2018  . IR PARACENTESIS  02/21/2018  . IR PARACENTESIS  03/06/2018  . IR PARACENTESIS  03/17/2018  . IR PARACENTESIS  04/04/2018  . IR PARACENTESIS  12/28/2018  . IR PARACENTESIS  01/08/2019  . IR PARACENTESIS  01/23/2019  . IR PARACENTESIS  02/01/2019  . IR PARACENTESIS  02/19/2019  . IR PARACENTESIS  03/01/2019  . IR PARACENTESIS  03/15/2019  . IR PARACENTESIS  04/03/2019  . IR PARACENTESIS  04/12/2019  . IR PARACENTESIS  05/01/2019  . IR  PARACENTESIS  05/08/2019  . IR PARACENTESIS  05/24/2019  . IR PARACENTESIS  06/12/2019  . IR PARACENTESIS  07/09/2019  . IR PARACENTESIS  07/27/2019  . IR PARACENTESIS  08/09/2019  . IR PARACENTESIS  08/21/2019  . IR PARACENTESIS  09/17/2019  . IR PARACENTESIS  10/05/2019  . IR PARACENTESIS  10/29/2019  . IR PARACENTESIS  11/08/2019  . IR PARACENTESIS  12/12/2019  . IR PARACENTESIS  01/03/2020  . IR PARACENTESIS  01/10/2020  . IR PARACENTESIS  01/17/2020  . IR PARACENTESIS  01/24/2020  . IR PARACENTESIS  01/31/2020  . IR PARACENTESIS  02/07/2020  . IR PARACENTESIS  02/21/2020  . IR RADIOLOGIST EVAL & MGMT  02/14/2018  . IR RADIOLOGIST EVAL & MGMT  02/22/2019  . LEFT HEART CATH AND CORONARY ANGIOGRAPHY N/A 02/22/2017   Procedure: LEFT HEART CATH AND CORONARY ANGIOGRAPHY;  Surgeon: Nigel Mormon, MD;  Location: [place] [facility];  Service: Cardiovascular;  Laterality: N/A;  . LIGATION OF ARTERIOVENOUS  FISTULA Left 03/18/2951   Procedure: Plication of Left Arm Arteriovenous Fistula;  Surgeon: Elam Dutch, MD;  Location: [facility];  Service: Vascular;  Laterality: Left;  . POLYPECTOMY    . POLYPECTOMY  01/25/2018   Procedure: POLYPECTOMY;  Surgeon: Jerene Bears, MD;  Location: [facility];  Service: Gastroenterology;;  . REVISON OF ARTERIOVENOUS FISTULA Left 8/41/3244   Procedure: PLICATION OF DISTAL ANEURYSMAL SEGEMENT OF LEFT UPPER ARM ARTERIOVENOUS FISTULA;  Surgeon: Elam Dutch, MD;  Location: [place];  Service: Vascular;  Laterality: Left;  . REVISON OF ARTERIOVENOUS FISTULA Left 0/04/2724   Procedure: Plication of Left Upper Arm Fistula ;  Surgeon: Waynetta Sandy, MD;  Location: [facility];  Service: Vascular;  Laterality: Left;  . SKIN GRAFT SPLIT THICKNESS LEG / FOOT Left    SKIN GRAFT SPLIT THICKNESS LEFT ARM DONOR SITE: LEFT ANTERIOR THIGH  . SKIN SPLIT GRAFT Left 07/04/2017   Procedure: SKIN GRAFT SPLIT THICKNESS LEFT ARM DONOR SITE: LEFT ANTERIOR THIGH;  Surgeon: Elam Dutch,  MD;  Location: [facility];  Service: Vascular;  Laterality: Left;  . THROMBECTOMY W/ EMBOLECTOMY Left 06/05/2017   Procedure: EXPLORATION OF LEFT ARM FOR BLEEDING; OVERSEWED PROXIMAL FISTULA;  Surgeon: Angelia Mould, MD;  Location: [facility];  Service: Vascular;  Laterality: Left;  . WOUND EXPLORATION Left 06/03/2017   Procedure: WOUND EXPLORATION WITH WOUND VAC APPLICATION TO LEFT ARM;  Surgeon: Angelia Mould, MD;  Location: [facility];  Service: Vascular;  Laterality: Left;       Family History  Problem Relation Age of Onset  . Heart disease Mother   . Lung cancer Mother   . Heart disease Father   . Malignant hyperthermia Father   . COPD Father   . Throat cancer Sister   . Esophageal cancer Sister   . Hypertension Other   . COPD Other   . Colon cancer Neg Hx   . Colon polyps Neg Hx   . Rectal cancer Neg Hx   . Stomach cancer Neg Hx     Social History   Tobacco Use  . Smoking status: Current Every Day Smoker    Packs/day: 0.50    Years: 43.00    Pack years: 21.50    Types: Cigarettes    Start date: 08/13/1973  . Smokeless tobacco: Never Used  Vaping Use  . Vaping Use: Never used  Substance Use Topics  . Alcohol use: Not Currently    Comment: quit drinking in 2017  . Drug use: Not Currently    Types: Marijuana, Cocaine    Comment: reports using once every 3 months,  Quit 04-06-2019    Home Medications Prior to Admission medications   Medication Sig Start Date End Date Taking? Authorizing Provider  albuterol (VENTOLIN HFA) 108 (90 Base) MCG/ACT inhaler Inhale 2 puffs into the lungs every 4 (four) hours as needed for wheezing or shortness of breath.  02/03/20  Yes [provider]  amiodarone (PACERONE) 200 MG tablet Take 1 tablet (200 mg total) by mouth daily. 01/29/20  Yes Shelly Coss, MD  diltiazem (CARDIZEM CD) 120 MG 24 hr capsule Take 1 capsule (120 mg total) by mouth daily. 03/20/20  05/19/20 Yes Adrian Prows, MD  diphenhydrAMINE (BENADRYL) 25 mg capsule  Take 1 capsule (25 mg total) by mouth every 6 (six) hours as needed for itching or allergies. 01/29/20  Yes Shelly Coss, MD  gabapentin (NEURONTIN) 300 MG capsule Take 300 mg by mouth in the morning and at bedtime. 05/09/20  Yes [provider]  metoprolol succinate (TOPROL XL) 25 MG 24 hr tablet Take 1 tablet (25 mg total) by mouth daily. 05/01/20  Yes Adrian Prows, MD  ondansetron (ZOFRAN) 4 MG tablet Take 1 tablet (4 mg total) by mouth every 8 (eight) hours as needed for nausea or vomiting. 05/02/20  Yes Adrian Prows, MD  Oxycodone HCl 10 MG TABS Take 10 mg by mouth 3 (three) times daily as needed (pain).  01/24/20  Yes [provider]  pantoprazole (PROTONIX) 40 MG tablet Take 1 tablet (40 mg total) by mouth 2 (two) times daily. 03/25/20  Yes Cantwell, Celeste C, PA-C  sevelamer carbonate (RENVELA) 800 MG tablet Take 2,400 mg by mouth See admin instructions. Take 3 tablets (2400 mg) by mouth up to three times daily with meals   Yes [provider]  sulfamethoxazole-trimethoprim (BACTRIM DS) 800-160 MG tablet Take 1 tablet by mouth daily. 05/06/20  Yes [provider]  tenofovir (VIREAD) 300 MG tablet Take 300 mg by mouth every Monday.  01/03/20  Yes [provider]  gabapentin (NEURONTIN) 100 MG capsule Take 1 capsule (100 mg total) by mouth 3 (three) times daily. [name] not taking: Reported on 05/19/2020 02/13/20 05/02/20  Darliss Cheney, MD    Allergies    Tramadol, Grass extracts [gramineae pollens], Morphine and related, Pollen extract, Acetaminophen, Aspirin, and Clonidine derivatives  Review of Systems   Review of Systems  Constitutional: Positive for fatigue. Negative for chills and fever.  HENT: Negative for congestion.   Eyes: Negative for pain.  Respiratory: Positive for shortness of breath. Negative for cough.   Cardiovascular: Negative for chest pain and leg swelling.  Gastrointestinal: Positive for nausea. Negative for abdominal pain and  vomiting.  Genitourinary: Negative for dysuria.  Musculoskeletal: Negative for myalgias.  Skin: Negative for rash.  Neurological: Negative for dizziness and headaches.    Physical Exam Updated Vital Signs BP 91/73 (BP Location: Right Leg)   Pulse 91   Temp 98.1 F (36.7 C) (Oral)   Resp (!) 26   Ht 5' 9"  (1.753 m)   Wt 65.8 kg   SpO2 100%   BMI 21.41 kg/m   Physical Exam Vitals and nursing note reviewed.  Constitutional:      General: [name] is not in acute distress.    Comments: Pleasant, chronically ill appearing 59 year old appears much older than stated age.  In no acute distress.  Sitting comfortably in bed.  Able answer questions appropriately follow commands. No increased work of breathing. Speaking in full sentences.  HENT:     Head: Normocephalic and atraumatic.     Nose: Nose normal.     Mouth/Throat:     Mouth: Mucous membranes are moist.  Eyes:     General: No scleral icterus. Cardiovascular:     Rate and Rhythm: Tachycardia present.     Pulses: Normal pulses.     Heart sounds: Normal heart sounds.  Pulmonary:     Effort: Pulmonary effort is normal. No respiratory distress.     Breath sounds: Rales present. No wheezing.     Comments: Scant crackles auscultated in bilateral bases.  Not tachypneic or tachycardic or dyspneic.  Speaking in full sentences without difficulty. Abdominal:     Palpations: Abdomen is soft.     Tenderness: There is no abdominal tenderness. There is no guarding or rebound.     Comments: Somewhat large, protuberant abdomen.  Questionable whether ascites.  No tenderness to palpation.  Musculoskeletal:     Cervical back: Normal range of motion.     Right lower leg: No edema.     Left lower leg: No edema.     Comments: No significant lower extremity edema  Skin:    General: Skin is warm and dry.     Capillary Refill: Capillary refill takes less than 2 seconds.  Neurological:     Mental Status: [name] is alert. Mental status is at baseline.   Psychiatric:        Mood and Affect: Mood normal.        Behavior: Behavior normal.     ED Results / Procedures / Treatments   Labs (all labs ordered are listed, but only abnormal results are displayed) Labs Reviewed  CBC - Abnormal; Notable for the following components:      Result Value   RBC 3.89 (*)    Hemoglobin 10.8 (*)    HCT 33.0 (*)    RDW 17.9 (*)    Platelets 136 (*)    All other components within normal limits  COMPREHENSIVE METABOLIC PANEL - Abnormal; Notable for the following components:   Potassium 6.9 (*)    Chloride 96 (*)    BUN 50 (*)    Creatinine, Ser 9.54 (*)    Calcium 10.4 (*)    AST 50 (*)    Alkaline Phosphatase 243 (*)    GFR, Estimated 6 (*)    All other components within normal limits  MAGNESIUM - Abnormal; Notable for the following components:   Magnesium 2.6 (*)    All other components within normal limits  RESPIRATORY PANEL BY RT PCR (FLU A&B, COVID)  CBG MONITORING, ED    EKG EKG Interpretation  Date/Time:  Monday May 19 2020 09:32:35 EST Ventricular Rate:  97 PR Interval:    QRS Duration: 170 QT Interval:  454 QTC Calculation: 576 R Axis:   145 Text Interpretation: Atrial fibrillation Right axis deviation Left bundle branch block Abnormal ECG Confirmed by Isla Pence [phone]) on 05/19/2020 11:14:29 AM   Radiology DG Chest Portable 1 View  Result Date: 05/19/2020 CLINICAL DATA:  Shortness of breath in a dialysis [name]. EXAM: PORTABLE CHEST 1 VIEW COMPARISON:  None. FINDINGS: There is cardiomegaly and vascular congestion. No consolidative process, pneumothorax or effusion. Aortic atherosclerosis noted. No acute or focal bony abnormality. IMPRESSION: Cardiomegaly mild vascular congestion. Aortic Atherosclerosis (ICD10-I70.0). Electronically Signed   By: Inge Rise M.D.   On: 05/19/2020 11:53    Procedures .Critical Care Performed by: Tedd Sias, PA Authorized by: Tedd Sias, PA   Critical care  provider statement:    Critical care time (minutes):  45   Critical care was necessary to treat or prevent imminent or life-threatening deterioration of the following conditions:  Metabolic crisis (Hyperkalemia with EKG changes)   Critical care was time spent personally by me on the following activities:  Discussions with consultants, evaluation of [name]'s response to treatment, examination of [name], ordering and performing treatments and interventions, ordering and review of laboratory studies, ordering and review of radiographic studies, pulse oximetry, re-evaluation of [name]'s condition, obtaining history from [name] or surrogate and review of old charts   (including critical  care time)  Medications Ordered in ED Medications  metoprolol tartrate (LOPRESSOR) injection 5 mg (has no administration in time range)  Chlorhexidine Gluconate Cloth 2 % PADS 6 each (has no administration in time range)  cinacalcet (SENSIPAR) tablet 30 mg (has no administration in time range)  calcitRIOL (ROCALTROL) capsule 1.25 mcg (has no administration in time range)  sevelamer carbonate (RENVELA) tablet 2,400 mg (has no administration in time range)  LORazepam (ATIVAN) injection 1 mg (1 mg Intravenous Not Given 05/19/20 1528)  diltiazem (CARDIZEM CD) 24 hr capsule 120 mg (has no administration in time range)  metoprolol succinate (TOPROL-XL) 24 hr tablet 25 mg (has no administration in time range)  pantoprazole (PROTONIX) EC tablet 40 mg (has no administration in time range)  albuterol (VENTOLIN HFA) 108 (90 Base) MCG/ACT inhaler 2 puff (has no administration in time range)  heparin injection 5,000 Units (has no administration in time range)  gabapentin (NEURONTIN) capsule 300 mg (has no administration in time range)  calcium chloride 1 g in sodium chloride 0.9 % 100 mL IVPB (0 g Intravenous Stopped 05/19/20 1405)  insulin aspart (novoLOG) injection 5 Units (5 Units Intravenous Given 05/19/20 1243)    And   dextrose 50 % solution 50 mL (50 mLs Intravenous Given 05/19/20 1243)    ED Course  I have reviewed the triage vital signs and the nursing notes.  Pertinent labs & imaging results that were available during my care of the [name] were reviewed by me and considered in my medical decision making (see chart for details).  [name] is 59 year old gentleman who is a dialysis [name] presented today with shortness of breath and some fatigue.  Found to have a potassium 6.9 compressive calcium gluconate and started on insulin and glucose infusion.  Physical exam is notable for abdominal distention without any tenderness to palpation.  Some mild crackles without any tachypnea, hypoxia or increased work of breathing. [name] was somewhat tachycardic.  Will give 1 dose of metoprolol if [name] continues to be tachycardic.  Clinical Course as of May 19 1538  [facility] May 19, 2020  1156 Discussed with Dr. Johnney Ou who will have [name] dialyzed once Covid test results. Plan is to admit to hospitalist after this.   [WF]  1405 Discussed with family medicine teaching service who will admit to hospital for hyperkalemia with EKG changes.    [WF]    Clinical Course User Index [WF] Tedd Sias, PA   CMP notable for calcium 6.9 magnesium is also somewhat elevated at 2.6.  [name] does have elevated BUN and creatinine is end-stage renal disease this is not unexpected.  Elevated alk phos which appears to be baseline for him.  CBC without leukocytosis stable anemia.  Covid test negative.  Chest x-ray with likely chronic pulmonary vascular congestion.  EKG notable for  peaked T waves consistent with hyperkalemia.  MDM Rules/Calculators/A&P                          I discussed this case with my attending physician who cosigned this note including [name]'s presenting symptoms, physical exam, and planned diagnostics and interventions. Attending physician stated agreement with plan or made changes to plan which were  implemented.   Attending physician assessed [name] at bedside.  The [name] appears reasonably stabilized for admission considering the current resources, flow, and capabilities available in the ED at this time, and I doubt any other [facility] requiring further screening and/or treatment in the ED prior  to admission.  Final Clinical Impression(s) / ED Diagnoses Final diagnoses:  Hyperkalemia    Rx / DC Orders ED Discharge Orders    None       Tedd Sias, [place] 05/19/20 1541    Isla Pence, MD 05/19/20 [phone]

## 2020-05-19 NOTE — ED Notes (Addendum)
Pt yelled out for Staff b/c he No Longer wants to be hooked up to the Monitors. Pt Wants the Monitor turned off so that he can sleep. Told Pt we need him hooked up to the Monitor so we can continue to Monitor him but Pt refused and took Wires off himself. Pt wanted Lights off and his bed back so he could sleep.

## 2020-05-19 NOTE — ED Notes (Signed)
Pt continuously takes off BP cuff and has to be instructed to put on same so we can get vitals. Pt has also done the same with techs.

## 2020-05-19 NOTE — Progress Notes (Signed)
Patient's repeat BMP showing K 6.8>6.6. Acknowledging that patient is now heading to dialysis.   Milus Banister, [place], PGY-3 05/19/2020 10:12 PM

## 2020-05-20 ENCOUNTER — Ambulatory Visit: Payer: Medicare Other | Admitting: Family Medicine

## 2020-05-20 ENCOUNTER — Other Ambulatory Visit: Payer: Self-pay

## 2020-05-20 ENCOUNTER — Encounter (HOSPITAL_COMMUNITY): Payer: Self-pay | Admitting: Family Medicine

## 2020-05-20 DIAGNOSIS — E875 Hyperkalemia: Principal | ICD-10-CM

## 2020-05-20 LAB — CBC WITH DIFFERENTIAL/PLATELET
Abs Immature Granulocytes: 0.03 10*3/uL (ref 0.00–0.07)
Basophils Absolute: 0.1 10*3/uL (ref 0.0–0.1)
Basophils Relative: 2 %
Eosinophils Absolute: 0.2 10*3/uL (ref 0.0–0.5)
Eosinophils Relative: 2 %
HCT: 30.6 % — ABNORMAL LOW (ref 39.0–52.0)
Hemoglobin: 10.4 g/dL — ABNORMAL LOW (ref 13.0–17.0)
Immature Granulocytes: 0 %
Lymphocytes Relative: 19 %
Lymphs Abs: 1.5 10*3/uL (ref 0.7–4.0)
MCH: 28 pg (ref 26.0–34.0)
MCHC: 34 g/dL (ref 30.0–36.0)
MCV: 82.5 fL (ref 80.0–100.0)
Monocytes Absolute: 0.7 10*3/uL (ref 0.1–1.0)
Monocytes Relative: 9 %
Neutro Abs: 5.1 10*3/uL (ref 1.7–7.7)
Neutrophils Relative %: 68 %
Platelets: 126 10*3/uL — ABNORMAL LOW (ref 150–400)
RBC: 3.71 MIL/uL — ABNORMAL LOW (ref 4.22–5.81)
RDW: 17.6 % — ABNORMAL HIGH (ref 11.5–15.5)
WBC: 7.5 10*3/uL (ref 4.0–10.5)
nRBC: 0 % (ref 0.0–0.2)

## 2020-05-20 MED ORDER — METOPROLOL SUCCINATE ER 25 MG PO TB24: 25.0000 mg | ORAL_TABLET | Freq: Every day | ORAL | Status: DC

## 2020-05-20 MED ORDER — AMIODARONE HCL 200 MG PO TABS
200.0000 mg | ORAL_TABLET | Freq: Every day | ORAL | Status: DC
  Administered 2020-05-20 – 2020-05-22 (×3): 200 mg via ORAL
  Filled 2020-05-20 (×3): qty 1

## 2020-05-20 MED ORDER — DILTIAZEM HCL ER COATED BEADS 120 MG PO CP24
120.0000 mg | ORAL_CAPSULE | Freq: Every day | ORAL | Status: DC
  Filled 2020-05-20: qty 1

## 2020-05-20 MED ORDER — DILTIAZEM HCL ER COATED BEADS 120 MG PO CP24
120.0000 mg | ORAL_CAPSULE | Freq: Every day | ORAL | Status: DC
  Administered 2020-05-20 – 2020-05-22 (×3): 120 mg via ORAL
  Filled 2020-05-20 (×3): qty 1

## 2020-05-20 MED ORDER — AMIODARONE HCL 200 MG PO TABS: 200.0000 mg | ORAL_TABLET | Freq: Every day | ORAL | Status: DC

## 2020-05-20 MED ORDER — GABAPENTIN 300 MG PO CAPS
300.0000 mg | ORAL_CAPSULE | Freq: Two times a day (BID) | ORAL | Status: DC
  Administered 2020-05-20 – 2020-05-22 (×4): 300 mg via ORAL
  Filled 2020-05-20 (×4): qty 1

## 2020-05-20 MED ORDER — METOPROLOL SUCCINATE ER 25 MG PO TB24
25.0000 mg | ORAL_TABLET | Freq: Every day | ORAL | Status: DC
  Administered 2020-05-20 – 2020-05-22 (×3): 25 mg via ORAL
  Filled 2020-05-20 (×3): qty 1

## 2020-05-20 NOTE — Evaluation (Signed)
Physical Therapy Evaluation Patient Details Name: [name] MRN: [id number] DOB: 10-28-1960 Today's Date: 05/20/2020   History of Present Illness  Pt is a 59 y/o male admitted secondary to increased SOB and tachycardia. Found to have hyperkalemia. PMH includes tobacco use, pulmonary HTN, Hep B, COPD, a fib, ESRD on HD, MWF, HTN, and CAD.   Clinical Impression  Pt admitted secondary to problem above with deficits below. Pt limited secondary to fatigue and elevated HR. HR ranging from mid 120s to low 130s throughout mobility tasks. Pt mod I for bed mobility and min guard for mobility tasks. Anticipate pt will progress well and not require follow up PT, however, will continue to reassess and update recommendations appropriately. Will continue to follow acutely.     Follow Up Recommendations No PT follow up    Equipment Recommendations  None recommended by PT    Recommendations for Other Services       Precautions / Restrictions Precautions Precautions: Other (comment) Precaution Comments: watch HR  Restrictions Weight Bearing Restrictions: No      Mobility  Bed Mobility Overal bed mobility: Modified Independent                  Transfers Overall transfer level: Needs assistance Equipment used: None Transfers: Sit to/from Stand Sit to Stand: Min guard         General transfer comment: min guard for safety  Ambulation/Gait Ambulation/Gait assistance: Min guard   Assistive device: None       General Gait Details: Pt taking side steps at edge of stretcher. Did not want to perform anymore mobility.   Stairs            Wheelchair Mobility    Modified Rankin (Stroke Patients Only)       Balance Overall balance assessment: Mild deficits observed, not formally tested                                           Pertinent Vitals/Pain Pain Assessment: No/denies pain    Home Living Family/patient expects to be discharged  to:: Private residence Living Arrangements: Alone Available Help at Discharge: Friend(s);Available PRN/intermittently Type of Home: Apartment Home Access: Stairs to enter Entrance Stairs-Rails: None Entrance Stairs-Number of Steps: 2 Home Layout: One level Home Equipment: Walker - 2 wheels;Cane - single point      Prior Function Level of Independence: Independent               Hand Dominance        Extremity/Trunk Assessment   Upper Extremity Assessment Upper Extremity Assessment: Defer to OT evaluation    Lower Extremity Assessment Lower Extremity Assessment: Generalized weakness    Cervical / Trunk Assessment Cervical / Trunk Assessment: Normal  Communication   Communication: No difficulties  Cognition Arousal/Alertness: Awake/alert Behavior During Therapy: WFL for tasks assessed/performed Overall Cognitive Status: No family/caregiver present to determine baseline cognitive functioning                                 General Comments: Pt with decreased awareness of deficits. Likely close to baseline.       General Comments General comments (skin integrity, edema, etc.): Pt's HR maintained in mid 120s to low 130s throughout    Exercises     Assessment/Plan    PT  Assessment Patient needs continued PT services  PT Problem List Cardiopulmonary status limiting activity;Decreased mobility;Decreased activity tolerance;Decreased safety awareness       PT Treatment Interventions Gait training;Stair training;Functional mobility training;Therapeutic activities;Therapeutic exercise;Balance training;Patient/family education    PT Goals (Current goals can be found in the Care Plan section)  Acute Rehab PT Goals Patient Stated Goal: to get out of here PT Goal Formulation: With patient Time For Goal Achievement: 06/03/20 Potential to Achieve Goals: Fair    Frequency Min 3X/week   Barriers to discharge Decreased caregiver support       Co-evaluation               AM-PAC PT "6 Clicks" Mobility  Outcome Measure Help needed turning from your back to your side while in a flat bed without using bedrails?: None Help needed moving from lying on your back to sitting on the side of a flat bed without using bedrails?: None Help needed moving to and from a bed to a chair (including a wheelchair)?: A Little Help needed standing up from a chair using your arms (e.g., wheelchair or bedside chair)?: A Little Help needed to walk in hospital room?: A Little Help needed climbing 3-5 steps with a railing? : A Lot 6 Click Score: 19    End of Session   Activity Tolerance: Patient limited by fatigue Patient left: in bed;with call bell/phone within reach (on stretcher in ED ) Nurse Communication: Mobility status PT Visit Diagnosis: Other abnormalities of gait and mobility (R26.89)    Time: 9574-7340 PT Time Calculation (min) (ACUTE ONLY): 12 min   Charges:   PT Evaluation $PT Eval Moderate Complexity: 1 Mod          [name], [name]  Acute Rehabilitation Services  Pager: [phone] Office: [phone]   Rudean Hitt 05/20/2020, 10:58 AM

## 2020-05-20 NOTE — Progress Notes (Signed)
Family Medicine Teaching Service Daily Progress Note Intern Pager: [phone]  Patient name: [name] Medical record number: [id number] Date of birth: 01/19/1961 Age: 59 y.o. Gender: male  Primary Care Provider: Patient, No Pcp Per Consultants: Nephrology Code Status: Full  Pt Overview and Major Events to Date:  Admitted 11/8  Assessment and Plan: [name] is a 59 y.o. male presenting with shortness of breath and found to have hyperkalemia with EKG changes. PMH is significant for ESRD on HD M/W/F, PAF with RVR, HTN, HLD, pulmonary HTN, COPD, tobacco use, Hep B/C, Cirrhosis with recurrent ascites, CAD, anemia of chronic disease and dietary/medication noncompliance.  Hyperkalemia with EKG changes  Hx of ESRD on HD M/W/F K this morning down to 4.6 after dialysis. T waves on EKG still appearing peaked.  Dialysis removed 1L.  Tachycardic with pulse of 129. -Telemetry, Vitals per routine -OOB with assistance, Renal diet, 1268mL fluid restriction -Nephrology consulted, appreciate recommendations -Restarted patient's Amiodarone 200 daily, Metoprolol 25 mg daily and Diltiazem 120 mg daily -PT/OT eval and treat  Prolonged QT Calculated manually at 550. - Avoid QT Prolonging medications  Dyspnea  Hx of COPD, Tobacco Use Received dialysis yesterday, dyspnea resolved.  Less concerning for COPD exacerbation.  Currently satting well RA. - Albuterol 2 puffs q4hr PRN - Supplemental O2 as needed, goal SpO2 >88% - PT/OT eval and treat    Cirrhosis with recurrent ascites No obvious peritoneal signs.  Increased concern if Tachycardia does not improve. Concern for restarting Bactrim given QT prolongation. - Discuss Anti-biotic prophylaxis with Pharm  Chronic Hepatitis B Did not receive weekly Tenofivir last night due to dialysis. - Restart Tenofivir   Paroxysmal A-Fib with RVR EKG this morning currently shows Sinus Tachycardia - Restarted home medications - Continue  to monitor HR  Anemia of Chronic Disease/ESRD Chronic, stable. Hgb 10.4 currenrtly. His baseline is ~10-11. -Daily CBC    Chronic Pain Syndrome On Gabapentin 300 mg BID and Oxycodone 10 mg TID at home. - Continue home Gabapentin 300 mg BID - Continue Oxycodone 10 mg TID  FEN/GI: Renal diet, 1263mL fluid restriction Prophylaxis: Heparin   Status is: Inpatient  Remains inpatient appropriate because:Hemodynamically unstable   Dispo: The patient is from: Home              Anticipated d/c is to: Home              Anticipated d/c date is: 2 days              Patient currently is not medically stable to d/c.    Subjective:  Pastient indicates is having abdominal pain, believes to be due to not liking food.  Indicates he is breathing well at this point.  Has no other complaints or concerns at this time.  Objective: Temp:  [98 F (36.7 C)-98.4 F (36.9 C)] 98.3 F (36.8 C) (11/09 0734) Pulse Rate:  [70-136] 129 (11/09 0630) Resp:  [14-33] 23 (11/09 0730) BP: (81-139)/(45-109) 124/80 (11/09 0730) SpO2:  [94 %-100 %] 100 % (11/09 0630) Weight:  [65.8 kg] 65.8 kg (11/08 2132) Physical Exam:  Physical Exam Constitutional:      General: He is not in acute distress.    Appearance: He is not ill-appearing.  HENT:     Head: Normocephalic and atraumatic.     Mouth/Throat:     Mouth: Mucous membranes are moist.  Cardiovascular:     Rate and Rhythm: Regular rhythm. Tachycardia present.  Pulmonary:  Effort: Pulmonary effort is normal.     Breath sounds: Wheezing present.     Comments: Mild wheezes on exam Abdominal:     General: There is distension.     Palpations: Abdomen is soft.  Skin:    General: Skin is warm.  Neurological:     Mental Status: He is alert.     Laboratory: Recent Labs  Lab 05/19/20 0934  WBC 8.7  HGB 10.8*  HCT 33.0*  PLT 136*   Recent Labs  Lab 05/19/20 0934 05/19/20 1750 05/20/20 0226  NA 136 133* 135  K 6.9* 6.6* 4.6  CL 96* 95*  94*  CO2 26 24 27   BUN 50* 56* 28*  CREATININE 9.54* 9.88* 6.07*  CALCIUM 10.4* 10.6* 10.0  PROT 7.6  --   --   BILITOT 1.0  --   --   ALKPHOS 243*  --   --   ALT 32  --   --   AST 50*  --   --   GLUCOSE 86 103* 75    Imaging/Diagnostic Tests:  Repeat EKG- Sinus tachycardia  Delora Fuel, MD 05/20/2020, 7:47 AM PGY-1, [place] Intern pager: [phone], text pages welcome

## 2020-05-20 NOTE — ED Notes (Signed)
Walked patient to the bathroom  

## 2020-05-20 NOTE — Progress Notes (Signed)
   05/20/20 1600  Vitals  Temp 98.2 F (36.8 C)  Temp Source Oral  BP 107/69  MAP (mmHg) 80  BP Location Left Arm  BP Method Automatic  Patient Position (if appropriate) Sitting  Pulse Rate Source Monitor  ECG Heart Rate (!) 127  Resp (!) 23  MEWS COLOR  MEWS Score Color Yellow  Oxygen Therapy  SpO2 99 %  O2 Device Room Air  MEWS Score  MEWS Temp 0  MEWS Systolic 0  MEWS Pulse 2  MEWS RR 1  MEWS LOC 0  MEWS Score 3  Provider Notification  Provider Name/Title MD Addison Naegeli  Date Provider Notified 05/20/20  Time Provider Notified 1603  Notification Type Face-to-face  Notification Reason Other (Comment)  Response See new orders  Date of Provider Response 05/20/20  Time of Provider Response 1031

## 2020-05-20 NOTE — ED Notes (Signed)
Report given to  Sarah, RN.

## 2020-05-20 NOTE — Progress Notes (Signed)
Interim Progress Note  Patient has returned from HD, RN concerned his HR has been tachycardic up to 130s (showing sinus tach). At home for history of Afib pt is on Amiodarone 200mg  daily, Cardizem 120mg  daily and Metop 25mg  daily. Restarted patient's meds.  Patient's most recent BP is 104/72 w/ a MAP of 83.   Milus Banister, [place], PGY-3 05/20/2020 3:09 AM

## 2020-05-20 NOTE — Progress Notes (Signed)
   05/20/20 1525  Assess: MEWS Score  Temp 98.6 F (37 C)  BP 90/70  Pulse Rate (!) 123  ECG Heart Rate (!) 123  Resp (!) 23  Level of Consciousness Alert  SpO2 99 %  O2 Device Room Air  Assess: MEWS Score  MEWS Temp 0  MEWS Systolic 1  MEWS Pulse 2  MEWS RR 1  MEWS LOC 0  MEWS Score 4  MEWS Score Color Red  Assess: if the MEWS score is Yellow or Red  Were vital signs taken at a resting state? Yes  Focused Assessment Change from prior assessment (see assessment flowsheet)  Early Detection of Sepsis Score *See Row Information* High  MEWS guidelines implemented *See Row Information* Yes  Treat  Pain Scale 0-10  Pain Score 0  Complains of Other (Comment) (hunger)  Take Vital Signs  Increase Vital Sign Frequency  Red: Q 1hr X 4 then Q 4hr X 4, if remains red, continue Q 4hrs  Escalate  MEWS: Escalate Red: discuss with charge nurse/RN and provider, consider discussing with RRT  Notify: Charge Nurse/RN  Name of Charge Nurse/RN Notified Katie  Date Charge Nurse/RN Notified 05/20/20  Time Charge Nurse/RN Notified 4970  Notify: Provider  Provider Name/Title Addison Naegeli  Date Provider Notified 05/20/20  Time Provider Notified 1546  Notification Type Call  Notification Reason Change in status  Response Other (Comment)  Date of Provider Response 05/20/20  Time of Provider Response 1546  Document  Patient Outcome Not stable and remains on department  Progress note created (see row info) Yes

## 2020-05-20 NOTE — Progress Notes (Signed)
[facility] Progress Note   Subjective:   Early s/o HD last PM with 1:15 remaining.   This AM having ST into the 130s.  Complains of hunger.  Unable to tell me if breathing is improved after HD.    Objective Vitals:   05/20/20 0835 05/20/20 0845 05/20/20 0900 05/20/20 1000  BP: 110/81  90/70 116/76  Pulse: (!) 129     Resp: (!) 22 (!) 21 19 (!) 26  Temp:      TempSrc:      SpO2: 100% 97%    Weight:      Height:       Physical Exam General: chronicall ill appearing Heart: tachy into 130s, no rub Lungs: dec bases, dec BS right mid field, sl ^ wob while rearranging to side of bed Abdomen: soft, mod distended Extremities: no edema Dialysis Access: RUE AVG +t/b  Additional Objective Labs: Basic Metabolic Panel: Recent Labs  Lab 05/19/20 0934 05/19/20 1750 05/20/20 0226  NA 136 133* 135  K 6.9* 6.6* 4.6  CL 96* 95* 94*  CO2 26 24 27   GLUCOSE 86 103* 75  BUN 50* 56* 28*  CREATININE 9.54* 9.88* 6.07*  CALCIUM 10.4* 10.6* 10.0   Liver Function Tests: Recent Labs  Lab 05/19/20 0934  AST 50*  ALT 32  ALKPHOS 243*  BILITOT 1.0  PROT 7.6  ALBUMIN 3.6   No results for input(s): LIPASE, AMYLASE in the last 168 hours. CBC: Recent Labs  Lab 05/19/20 0934 05/20/20 0914  WBC 8.7 7.5  NEUTROABS  --  5.1  HGB 10.8* 10.4*  HCT 33.0* 30.6*  MCV 84.8 82.5  PLT 136* 126*   Blood Culture    Component Value Date/Time   SDES BLOOD RIGHT HAND 02/04/2020 0930   SPECREQUEST  02/04/2020 0930    BOTTLES DRAWN AEROBIC AND ANAEROBIC Blood Culture results may not be optimal due to an inadequate volume of blood received in culture bottles   CULT  02/04/2020 0930    NO GROWTH 5 DAYS Performed at [facility], [place] [address], [place], [place] [address]    REPTSTATUS 02/09/2020 FINAL 02/04/2020 0930    Cardiac Enzymes: No results for input(s): CKTOTAL, CKMB, CKMBINDEX, TROPONINI in the last 168 hours. CBG: Recent Labs  Lab 05/19/20 1218  GLUCAP 80    Iron Studies: No results for input(s): IRON, TIBC, TRANSFERRIN, FERRITIN in the last 72 hours. @lablastinr3 @ Studies/Results: DG Chest Portable 1 View  Result Date: 05/19/2020 CLINICAL DATA:  Shortness of breath in a dialysis patient. EXAM: PORTABLE CHEST 1 VIEW COMPARISON:  None. FINDINGS: There is cardiomegaly and vascular congestion. No consolidative process, pneumothorax or effusion. Aortic atherosclerosis noted. No acute or focal bony abnormality. IMPRESSION: Cardiomegaly mild vascular congestion. Aortic Atherosclerosis (ICD10-I70.0). Electronically Signed   By: Inge Rise M.D.   On: 05/19/2020 11:53   Medications: . sodium chloride    . sodium chloride     . amiodarone  200 mg Oral Daily  . calcitRIOL  1.25 mcg Oral Q M,W,F-HD  . Chlorhexidine Gluconate Cloth  6 each Topical Q0600  . cinacalcet  30 mg Oral Q M,W,F-HD  . diltiazem  120 mg Oral Daily  . gabapentin  300 mg Oral BID  . heparin  5,000 Units Subcutaneous Q8H  . LORazepam  1 mg Intravenous Once  . metoprolol succinate  25 mg Oral Daily  . metoprolol tartrate  5 mg Intravenous Once  . pantoprazole  40 mg Oral BID  .  sevelamer carbonate  2,400 mg Oral TID WC  . tenofovir  300 mg Oral Q Mon   Dialysis orders: MWF [place] 4hrs, 450 / 800, EDW 68kg 2K/2Ca, AVG mircera 60 q2wks  Last Hb 11 11/3 venofer 50 weekly sensipar 30 TIW, calctriol 1.25 TIW 10/20 phos 4.5 ca 9.5, PTH 781   Assessment/Plan **Hyperkalemia;  Resolved after HD with K 4.6 this AM.  Recent K 5.1 at outpt HD.  Encourage tx adherence.   **ESRD on HD:  HD last PM - early s/o.   UF as BP tolerates -  1.2L.  EDW 68kg but wt here 65.8kg in the ED.  No weight collected pre/post HD - . Encouraged him to run full tx times   **Anemia:   Hb 10s.  Follow   **Secondary hyperparathyroidism:  Cont sensipar 30 TIW, calcitriol 1.25 TIW, renvela 2400 TIDAC.   **HCV: s/p treatment  ** A fib, now with sinus tachycardia:  Being eval by primary.  Jannifer Hick MD 05/20/2020, 10:51 AM  [facility] Pager: [phone]

## 2020-05-20 NOTE — Progress Notes (Signed)
   05/20/20 1329  Assess: MEWS Score  Temp 98.5 F (36.9 C)  BP 106/72  Pulse Rate (!) 131  Resp 18  SpO2 98 %  Assess: MEWS Score  MEWS Temp 0  MEWS Systolic 0  MEWS Pulse 3  MEWS RR 0  MEWS LOC 0  MEWS Score 3  MEWS Score Color Yellow  Assess: if the MEWS score is Yellow or Red  Were vital signs taken at a resting state? Yes  Focused Assessment No change from prior assessment  Early Detection of Sepsis Score *See Row Information* High  MEWS guidelines implemented *See Row Information* Yes  Treat  MEWS Interventions Administered scheduled meds/treatments  Pain Scale 0-10  Pain Score 0  Notify: Charge Nurse/RN  Name of Charge Nurse/RN Notified Katie  Date Charge Nurse/RN Notified 05/20/20  Time Charge Nurse/RN Notified 1337  Document  Patient Outcome Other (Comment) (will monitor)  Progress note created (see row info) Yes

## 2020-05-20 NOTE — Progress Notes (Signed)
Patient refusal to stay for hd tx any longer pt informed he has 1hr 15 min left states I'm tired and hungry cant stay any longer feels sick and tired. Pt given graham crackers; however, states he needed a meal prior to HD and cannot cont tx. Pt tx ended early AMA per his insistence. Goal fluid removal 1153 ml net.

## 2020-05-20 NOTE — Progress Notes (Signed)
FPTS Interim Progress Note  Paged by RN that patient's MEWS score changed to red due to tachycardia of 123bpm, blood pressure 90/70, and RR 23.  Went to bedside to evaluate patient and he was resting comfortably in bed, about to eat his dinner. Endorsed slight shortness of breath but denied other symptoms including chest pain, palpitations, dizziness, or lightheadedness. Recheck of BP with appropriate sized cuff was 107/69. Physical exam was unchanged from earlier in the day. Patient's telemetry showing sinus tach in the 120s.  Will continue to monitor HR closely. If persistently elevated, consider switching metoprolol to BID or increasing dose.   Alcus Dad, MD 05/20/2020, 3:46 PM PGY-1, [place] Medicine Service pager [phone]

## 2020-05-20 NOTE — ED Notes (Signed)
Lunch Tray Ordered @ 1033. 

## 2020-05-21 ENCOUNTER — Inpatient Hospital Stay (HOSPITAL_COMMUNITY): Payer: Medicare Other

## 2020-05-21 HISTORY — PX: IR PARACENTESIS: IMG2679

## 2020-05-21 LAB — BODY FLUID CELL COUNT WITH DIFFERENTIAL
Eos, Fluid: 0 %
Lymphs, Fluid: 45 %
Monocyte-Macrophage-Serous Fluid: 51 % (ref 50–90)
Neutrophil Count, Fluid: 3 % (ref 0–25)
Other Cells, Fluid: 1 %
Total Nucleated Cell Count, Fluid: 518 cu mm (ref 0–1000)

## 2020-05-21 LAB — BASIC METABOLIC PANEL
Anion gap: 14 (ref 5–15)
Anion gap: 14 (ref 5–15)
Anion gap: 13 (ref 5–15)
BUN: 28 mg/dL — ABNORMAL HIGH (ref 6–20)
BUN: 46 mg/dL — ABNORMAL HIGH (ref 6–20)
BUN: 15 mg/dL (ref 6–20)
CO2: 27 mmol/L (ref 22–32)
CO2: 25 mmol/L (ref 22–32)
CO2: 28 mmol/L (ref 22–32)
Calcium: 10 mg/dL (ref 8.9–10.3)
Calcium: 9.9 mg/dL (ref 8.9–10.3)
Calcium: 9.7 mg/dL (ref 8.9–10.3)
Chloride: 94 mmol/L — ABNORMAL LOW (ref 98–111)
Chloride: 94 mmol/L — ABNORMAL LOW (ref 98–111)
Chloride: 95 mmol/L — ABNORMAL LOW (ref 98–111)
Creatinine, Ser: 6.07 mg/dL — ABNORMAL HIGH (ref 0.61–1.24)
Creatinine, Ser: 8.6 mg/dL — ABNORMAL HIGH (ref 0.61–1.24)
Creatinine, Ser: 4.31 mg/dL — ABNORMAL HIGH (ref 0.61–1.24)
GFR, Estimated: 10 mL/min — ABNORMAL LOW (ref >60)
GFR, Estimated: 7 mL/min — ABNORMAL LOW (ref >60)
GFR, Estimated: 15 mL/min — ABNORMAL LOW (ref >60)
Glucose, Bld: 75 mg/dL (ref 70–99)
Glucose, Bld: 127 mg/dL — ABNORMAL HIGH (ref 70–99)
Glucose, Bld: 102 mg/dL — ABNORMAL HIGH (ref 70–99)
Potassium: 4.6 mmol/L (ref 3.5–5.1)
Potassium: 4.9 mmol/L (ref 3.5–5.1)
Potassium: 3.8 mmol/L (ref 3.5–5.1)
Sodium: 135 mmol/L (ref 135–145)
Sodium: 133 mmol/L — ABNORMAL LOW (ref 135–145)
Sodium: 136 mmol/L (ref 135–145)

## 2020-05-21 LAB — CBC
HCT: 31.5 % — ABNORMAL LOW (ref 39.0–52.0)
Hemoglobin: 10.9 g/dL — ABNORMAL LOW (ref 13.0–17.0)
MCH: 28.3 pg (ref 26.0–34.0)
MCHC: 34.6 g/dL (ref 30.0–36.0)
MCV: 81.8 fL (ref 80.0–100.0)
Platelets: 147 10*3/uL — ABNORMAL LOW (ref 150–400)
RBC: 3.85 MIL/uL — ABNORMAL LOW (ref 4.22–5.81)
RDW: 17.5 % — ABNORMAL HIGH (ref 11.5–15.5)
WBC: 7.2 10*3/uL (ref 4.0–10.5)
nRBC: 0 % (ref 0.0–0.2)

## 2020-05-21 LAB — GRAM STAIN

## 2020-05-21 MED ORDER — CALCITRIOL 0.5 MCG PO CAPS
ORAL_CAPSULE | ORAL | Status: AC
  Administered 2020-05-21: 1.25 ug via ORAL
  Filled 2020-05-21: qty 2

## 2020-05-21 MED ORDER — CEFDINIR 300 MG PO CAPS
300.0000 mg | ORAL_CAPSULE | ORAL | Status: DC
  Administered 2020-05-21: 300 mg via ORAL
  Filled 2020-05-21: qty 1

## 2020-05-21 MED ORDER — LIDOCAINE HCL 1 % IJ SOLN
INTRAMUSCULAR | Status: AC | PRN
  Administered 2020-05-21: 10 mL

## 2020-05-21 MED ORDER — CEFDINIR 300 MG PO CAPS
300.0000 mg | ORAL_CAPSULE | Freq: Once | ORAL | Status: DC
  Filled 2020-05-21: qty 1

## 2020-05-21 MED ORDER — CAMPHOR-MENTHOL 0.5-0.5 % EX LOTN
TOPICAL_LOTION | CUTANEOUS | Status: DC | PRN
  Filled 2020-05-21 (×2): qty 222

## 2020-05-21 MED ORDER — CALCITRIOL 0.25 MCG PO CAPS
ORAL_CAPSULE | ORAL | Status: AC
  Administered 2020-05-21: 1.25 ug via ORAL
  Filled 2020-05-21: qty 1

## 2020-05-21 MED ORDER — IOHEXOL 300 MG/ML  SOLN
100.0000 mL | Freq: Once | INTRAMUSCULAR | Status: AC | PRN
  Administered 2020-05-21: 78 mL via INTRAVENOUS

## 2020-05-21 MED ORDER — LIDOCAINE HCL 1 % IJ SOLN
INTRAMUSCULAR | Status: AC
  Filled 2020-05-21: qty 20

## 2020-05-21 MED ORDER — DIPHENHYDRAMINE HCL 25 MG PO CAPS
ORAL_CAPSULE | ORAL | Status: AC
  Administered 2020-05-21: 25 mg via ORAL
  Filled 2020-05-21: qty 1

## 2020-05-21 MED ORDER — DIPHENHYDRAMINE HCL 25 MG PO CAPS: 25.0000 mg | ORAL_CAPSULE | Freq: Once | ORAL | Status: AC

## 2020-05-21 MED ORDER — ALBUMIN HUMAN 25 % IV SOLN: 12.5000 g | Freq: Once | INTRAVENOUS | Status: DC

## 2020-05-21 NOTE — Progress Notes (Signed)
[facility] Progress Note   Subjective:  C/o pruritis during HD - requesting lotion.  No other new complaints  Objective Vitals:   05/20/20 1525 05/20/20 1600 05/20/20 1805 05/20/20 2010  BP: 90/70 107/69 92/61 102/74  Pulse: (!) 123  74 (!) 130  Resp: (!) 23 (!) 23 18 20   Temp: 98.6 F (37 C) 98.2 F (36.8 C) 98 F (36.7 C) 98.7 F (37.1 C)  TempSrc: Oral Oral Oral Oral  SpO2: 99% 99% 97% 100%  Weight:      Height:       Physical Exam General: chronicall ill appearing Heart: tachy into 120s, no rub Lungs: dec bases, dec BS right mid field Abdomen: soft, mod distended, unchanged Extremities: no edema Dialysis Access: RUE AVG +t/b, Qb 400 during HD currently  Additional Objective Labs: Basic Metabolic Panel: Recent Labs  Lab 05/19/20 0934 05/19/20 1750 05/20/20 0226  NA 136 133* 135  K 6.9* 6.6* 4.6  CL 96* 95* 94*  CO2 26 24 27   GLUCOSE 86 103* 75  BUN 50* 56* 28*  CREATININE 9.54* 9.88* 6.07*  CALCIUM 10.4* 10.6* 10.0   Liver Function Tests: Recent Labs  Lab 05/19/20 0934  AST 50*  ALT 32  ALKPHOS 243*  BILITOT 1.0  PROT 7.6  ALBUMIN 3.6   No results for input(s): LIPASE, AMYLASE in the last 168 hours. CBC: Recent Labs  Lab 05/19/20 0934 05/20/20 0914  WBC 8.7 7.5  NEUTROABS  --  5.1  HGB 10.8* 10.4*  HCT 33.0* 30.6*  MCV 84.8 82.5  PLT 136* 126*   Blood Culture    Component Value Date/Time   SDES BLOOD RIGHT HAND 02/04/2020 0930   SPECREQUEST  02/04/2020 0930    BOTTLES DRAWN AEROBIC AND ANAEROBIC Blood Culture results may not be optimal due to an inadequate volume of blood received in culture bottles   CULT  02/04/2020 0930    NO GROWTH 5 DAYS Performed at [facility], [place] [address], [place], [place] [address]    REPTSTATUS 02/09/2020 FINAL 02/04/2020 0930    Cardiac Enzymes: No results for input(s): CKTOTAL, CKMB, CKMBINDEX, TROPONINI in the last 168 hours. CBG: Recent Labs  Lab 05/19/20 1218  GLUCAP  80   Iron Studies: No results for input(s): IRON, TIBC, TRANSFERRIN, FERRITIN in the last 72 hours. @lablastinr3 @ Studies/Results: DG Chest Portable 1 View  Result Date: 05/19/2020 CLINICAL DATA:  Shortness of breath in a dialysis patient. EXAM: PORTABLE CHEST 1 VIEW COMPARISON:  None. FINDINGS: There is cardiomegaly and vascular congestion. No consolidative process, pneumothorax or effusion. Aortic atherosclerosis noted. No acute or focal bony abnormality. IMPRESSION: Cardiomegaly mild vascular congestion. Aortic Atherosclerosis (ICD10-I70.0). Electronically Signed   By: Inge Rise M.D.   On: 05/19/2020 11:53   Medications: . sodium chloride    . sodium chloride     . amiodarone  200 mg Oral Daily  . calcitRIOL  1.25 mcg Oral Q M,W,F-HD  . Chlorhexidine Gluconate Cloth  6 each Topical Q0600  . cinacalcet  30 mg Oral Q M,W,F-HD  . diltiazem  120 mg Oral Daily  . gabapentin  300 mg Oral BID  . heparin  5,000 Units Subcutaneous Q8H  . metoprolol succinate  25 mg Oral Daily  . pantoprazole  40 mg Oral BID  . sevelamer carbonate  2,400 mg Oral TID WC  . tenofovir  300 mg Oral Q Mon   Dialysis orders: MWF [place] 4hrs, 450 / 800, EDW 68kg 2K/2Ca, AVG  mircera 60 q2wks  Last Hb 11 11/3 venofer 50 weekly sensipar 30 TIW, calctriol 1.25 TIW 10/20 phos 4.5 ca 9.5, PTH 781   Assessment/Plan **ESRD on HD:  HD MOn PM - early s/o.   HD today.  Will need new EDW at discharge - under.  Encouraged him to run full tx times  **Hyperkalemia;  Resolved after HD.  Recent K 5.1 at outpt HD.  Encourage tx adherence.   **Anemia:   Hb 10s.  Follow   **Secondary hyperparathyroidism:  Cont sensipar 30 TIW, calcitriol 1.25 TIW, renvela 2400 TIDAC.   **HCV: s/p treatment  ** A fib, now with sinus tachycardia:  Being eval by primary - we discussed CTA and I gave consent for primary to proceed.  Jannifer Hick MD 05/21/2020, 8:32 AM  [facility] Pager: [phone]

## 2020-05-21 NOTE — Procedures (Signed)
PROCEDURE SUMMARY:  Successful image-guided paracentesis from the left lateral abdomen.  Yielded 3.65 liters of dark red fluid.  No immediate complications.  EBL = 0 mL. Patient tolerated well.   Specimen was sent for labs.  Please see imaging section of Epic for full dictation.   Earley Abide PA-C 05/21/2020 3:53 PM

## 2020-05-21 NOTE — Progress Notes (Signed)
OT Cancellation Note  Patient Details Name: [name] MRN: [id number] DOB: [birth year]   Cancelled Treatment:    Reason Eval/Treat Not Completed: Patient at procedure or test/ unavailable (HD)  Rhonin Trott,H[place] 05/21/2020, 1:18 PM  Maurie Boettcher, OT/L   Acute OT Clinical Specialist Acute Rehabilitation Services Pager [phone] Office [phone]

## 2020-05-21 NOTE — Plan of Care (Signed)
  Problem: Pain Managment: Goal: General experience of comfort will improve Outcome: Progressing   Problem: Safety: Goal: Ability to remain free from injury will improve Outcome: Progressing   

## 2020-05-21 NOTE — Progress Notes (Signed)
Family Medicine Teaching Service Daily Progress Note Intern Pager: [phone]  Patient name: [name] Medical record number: [id number] Date of birth: 02/10/1961 Age: 59 y.o. Gender: male  Primary Care Provider: Patient, No Pcp Per Consultants: Nephro Code Status: Full  Pt Overview and Major Events to Date:  Admitted 11/8  Assessment and Plan: Yesenia Fontenette Hoskinsis a 59 y.o.malepresenting with shortness of breath and found to have hyperkalemia with EKG changes. PMH is significant forESRD on HD M/W/F, PAF with RVR, HTN, HLD, pulmonary HTN, COPD, tobacco use, Hep B/C, Cirrhosis with recurrent ascites, CAD, anemia of chronic disease and dietary/medication noncompliance.  Hyperkalemia with EKG changes  Hx of ESRD on HD M/W/F K- 4.9 this morning. T waves on EKG still appearing peaked.  Dialysis scheduled this morning.  Tachycardic with pulse of 129.  Concern for PE as tachycardia cause still unknown. - Obtain CTA -Telemetry, Vitals per routine -OOB with assistance,Renaldiet, 1219mL fluid restriction -Nephrology consulted, appreciate recommendations -Restarted patient's Amiodarone 200 daily, Metoprolol 25 mg daily and Diltiazem 120 mg daily -PT/OT eval and treat  Cirrhosis with recurrent ascites No obvious peritoneal signs.  Increased concern if Tachycardia does not improve.  - Will start Cefdenir 300 mg every MWF after Dialysis per Pharm recs - Obtain Therapeutic Paracentesis today   Dyspnea  Hx of COPD, Tobacco Use Patient on 2L O2 when entered room.   - Albuterol 2 puffs q4hr PRN - Supplemental O2 as needed, goal SpO2 >88% - PT/OT eval and treat  Prolonged QT Calculated manually at 550. - Avoid QT Prolonging medications - repeat EKG after Dialysis   Chronic Hepatitis B Did not receive weekly Tenofivir last night due to dialysis. - Restart Tenofivir   ParoxysmalA-Fib with RVR Had reported A-Fib on the tele. - On metoprolol XL 25mg  daily, amiodarone  200mg  daily, and cardizem CD 120mg  daily - Repeat EKG this afternoon  Anemia of Chronic Disease/ESRD Chronic, stable.Hgb 10.4 currenrtly. His baseline is ~10-11. -Daily CBC    Chronic Pain Syndrome On Gabapentin 300 mg BID and Oxycodone 10 mg TID at home. - Continue home Gabapentin 300 mg BID - Continue Oxycodone 10 mg TID  FEN/GI: Renal diet, 1267mL fluid restriction PPx: Heparin   Status is: Inpatient  Remains inpatient appropriate because:Hemodynamically unstable   Dispo: The patient is from: Home              Anticipated d/c is to: Home              Anticipated d/c date is: 2 days              Patient currently is not medically stable to d/c.   Subjective:  Patient indicates is feeling well this morning.  Not having difficulty breathing or abdominal pain.  Indicates he feels like O2 is helpful.  Also asking if he can undergo Paracentesis to remove fluid.  Objective: Temp:  [98 F (36.7 C)-98.7 F (37.1 C)] 98.7 F (37.1 C) (11/09 2010) Pulse Rate:  [74-131] 130 (11/09 2010) Resp:  [18-26] 20 (11/09 2010) BP: (90-116)/(61-76) 102/74 (11/09 2010) SpO2:  [97 %-100 %] 100 % (11/09 2010) Physical Exam:  Physical Exam Constitutional:      Appearance: Normal appearance.  HENT:     Head: Normocephalic and atraumatic.     Mouth/Throat:     Mouth: Mucous membranes are moist.  Cardiovascular:     Rate and Rhythm: Regular rhythm. Tachycardia present.  Pulmonary:     Effort: Pulmonary effort is normal.  Breath sounds: Normal breath sounds.  Abdominal:     General: There is distension.     Palpations: Abdomen is soft.     Tenderness: There is no abdominal tenderness.  Skin:    General: Skin is warm.     Capillary Refill: Capillary refill takes less than 2 seconds.  Neurological:     Mental Status: He is alert.     Laboratory: Recent Labs  Lab 05/19/20 0934 05/20/20 0914  WBC 8.7 7.5  HGB 10.8* 10.4*  HCT 33.0* 30.6*  PLT 136* 126*   Recent Labs   Lab 05/19/20 0934 05/19/20 1750 05/20/20 0226  NA 136 133* 135  K 6.9* 6.6* 4.6  CL 96* 95* 94*  CO2 26 24 27   BUN 50* 56* 28*  CREATININE 9.54* 9.88* 6.07*  CALCIUM 10.4* 10.6* 10.0  PROT 7.6  --   --   BILITOT 1.0  --   --   ALKPHOS 243*  --   --   ALT 32  --   --   AST 50*  --   --   GLUCOSE 86 103* 75    Imaging/Diagnostic Tests: No new imaging  Delora Fuel, MD 05/21/2020, 8:43 AM PGY-1, [place] Intern pager: [phone], text pages welcome

## 2020-05-21 NOTE — Hospital Course (Addendum)
[name]  is a 59 y.o. male presenting with shortness of breath and found to have hyperkalemia with EKG changes. PMH is significant for ESRD on HD M/W/F, PAF with RVR, HTN, HLD, pulmonary HTN, COPD, tobacco use, Hep B/C, Cirrhosis with recurrent ascites, CAD, anemia of chronic disease and dietary/medication noncompliance.  Hyperkalemia with EKG changes  Hx of ESRD on HD M/W/F Initially had K of 6.9.  Evidence of Peaked T waves on EKG.  Had missed/left early few dialysis appointments.  He was given calcium chloride 1g in the ED as well as insulin/dextrose.  Hyperkalemia resolved after first Dialysis treatment and remained in normal range throughout rest of hospital stay.  Was normal on time of discharge, and T waves less peaked by time of discharge.  Cirrhosis with recurrent ascites Patient has history of Chronic Hepatitis B.  Has significant ascites and received therapeutic Paracentesis.  Patient on Prophylactic daily Bactrim and was changed to Cefdenir MWF after Dialysis at the recommendation of Pharm given Bactrim's potential for Hyperkalemia and QT prolongation and Cefdenir's coverage profile.  No Perotineal signs during hospital stay.  Dyspnea  Hx of COPD, Tobacco Use Patint initally came in complaining of dyspnea.  Chest X-Ray obtained that showed mild vascular congestion.  O2 Sats remained > 90 on RA.  Treated with Albuterol 2 puffs q4hr PRN.  Dyspnea resolved with first dialysis treatment.  Paroxysmal A-Fib with RVR Patient initially came in with A-Fib with RVR after missing a few doses of his Metoprolol, Amiodarone and Diltiazem.  Was restarted on medications, but continued to be tachycardic for a day after dialysis.  A CTA was obtained due to rule out PE as possible cause for continued tachycardia. By 11/11 EKG was obtained showing rate-controlled Atrial Fibrillation.

## 2020-05-22 ENCOUNTER — Other Ambulatory Visit (HOSPITAL_COMMUNITY): Payer: Self-pay | Admitting: Family Medicine

## 2020-05-22 DIAGNOSIS — E875 Hyperkalemia: Secondary | ICD-10-CM | POA: Diagnosis not present

## 2020-05-22 DIAGNOSIS — I4891 Unspecified atrial fibrillation: Secondary | ICD-10-CM

## 2020-05-22 LAB — RENAL FUNCTION PANEL
Albumin: 3.2 g/dL — ABNORMAL LOW (ref 3.5–5.0)
Anion gap: 12 (ref 5–15)
BUN: 24 mg/dL — ABNORMAL HIGH (ref 6–20)
CO2: 27 mmol/L (ref 22–32)
Calcium: 9.2 mg/dL (ref 8.9–10.3)
Chloride: 96 mmol/L — ABNORMAL LOW (ref 98–111)
Creatinine, Ser: 5.98 mg/dL — ABNORMAL HIGH (ref 0.61–1.24)
GFR, Estimated: 10 mL/min — ABNORMAL LOW (ref >60)
Glucose, Bld: 106 mg/dL — ABNORMAL HIGH (ref 70–99)
Phosphorus: 3.9 mg/dL (ref 2.5–4.6)
Potassium: 4.3 mmol/L (ref 3.5–5.1)
Sodium: 135 mmol/L (ref 135–145)

## 2020-05-22 LAB — CBC
HCT: 30.7 % — ABNORMAL LOW (ref 39.0–52.0)
Hemoglobin: 10.7 g/dL — ABNORMAL LOW (ref 13.0–17.0)
MCH: 28.9 pg (ref 26.0–34.0)
MCHC: 34.9 g/dL (ref 30.0–36.0)
MCV: 83 fL (ref 80.0–100.0)
Platelets: 121 10*3/uL — ABNORMAL LOW (ref 150–400)
RBC: 3.7 MIL/uL — ABNORMAL LOW (ref 4.22–5.81)
RDW: 17.4 % — ABNORMAL HIGH (ref 11.5–15.5)
WBC: 6.4 10*3/uL (ref 4.0–10.5)
nRBC: 0 % (ref 0.0–0.2)

## 2020-05-22 LAB — PATHOLOGIST SMEAR REVIEW

## 2020-05-22 MED ORDER — CEFDINIR 300 MG PO CAPS
300.0000 mg | ORAL_CAPSULE | ORAL | 0 refills | Status: DC
Start: 2020-05-23 — End: 2020-08-06

## 2020-05-22 MED ORDER — TENOFOVIR DISOPROXIL FUMARATE 300 MG PO TABS: ORAL_TABLET | ORAL | 0 refills | Status: DC

## 2020-05-22 MED FILL — CEFDINIR 300 MG CAPSULE: 300 | 60 days supply | Qty: 24 | Fill #0

## 2020-05-22 MED FILL — TENOFOVIR DIS FUMARA 300mg: 300 | 60 days supply | Qty: 12 | Fill #0

## 2020-05-22 NOTE — Care Management Important Message (Signed)
Important Message  Patient Details  Name: [name] MRN: [id number] Date of Birth: [birth year]   Medicare Important Message Given:  Yes     [name] [address] Circle 05/22/2020, 2:59 PM

## 2020-05-22 NOTE — Progress Notes (Signed)
[facility] Progress Note   Subjective:  CTA yesterday without PE. Tol HD with UF 3.5L yesterday. Feeling better.  No c/os this AM  Objective Vitals:   05/21/20 1341 05/21/20 1345 05/21/20 2157 05/22/20 0512  BP: 119/75 121/78 105/62 (!) 119/58  Pulse: (!) 104 (!) 105 60 60  Resp:   18 20  Temp:  97.9 F (36.6 C) 98 F (36.7 C) 98.1 F (36.7 C)  TempSrc:  Oral Oral Oral  SpO2:  96% 96% 95%  Weight:  69.2 kg    Height:       Physical Exam General: chronically ill appearing Heart: RRR, no rub Lungs: dec bases, dec BS right mid field, normal WOB  Abdomen: soft, mod distended, unchanged Extremities: no edema Dialysis Access: RUE AVG +t/b   Additional Objective Labs: Basic Metabolic Panel: Recent Labs  Lab 05/20/20 0226 05/21/20 0834 05/21/20 1643  NA 135 133* 136  K 4.6 4.9 3.8  CL 94* 94* 95*  CO2 27 25 28   GLUCOSE 75 127* 102*  BUN 28* 46* 15  CREATININE 6.07* 8.60* 4.31*  CALCIUM 10.0 9.9 9.7   Liver Function Tests: Recent Labs  Lab 05/19/20 0934  AST 50*  ALT 32  ALKPHOS 243*  BILITOT 1.0  PROT 7.6  ALBUMIN 3.6   No results for input(s): LIPASE, AMYLASE in the last 168 hours. CBC: Recent Labs  Lab 05/19/20 0934 05/20/20 0914 05/21/20 1643  WBC 8.7 7.5 7.2  NEUTROABS  --  5.1  --   HGB 10.8* 10.4* 10.9*  HCT 33.0* 30.6* 31.5*  MCV 84.8 82.5 81.8  PLT 136* 126* 147*   Blood Culture    Component Value Date/Time   SDES PERITONEAL FLUID 05/21/2020 1613   SPECREQUEST ABDOMEN 05/21/2020 1613   CULT  02/04/2020 0930    NO GROWTH 5 DAYS Performed at [facility], [place] [address] [place],  [address]    REPTSTATUS 05/21/2020 FINAL 05/21/2020 1613    Cardiac Enzymes: No results for input(s): CKTOTAL, CKMB, CKMBINDEX, TROPONINI in the last 168 hours. CBG: Recent Labs  Lab 05/19/20 1218  GLUCAP 80   Iron Studies: No results for input(s): IRON, TIBC, TRANSFERRIN, FERRITIN in the last 72  hours. @lablastinr3 @ Studies/Results: CT ANGIO CHEST PE W OR WO CONTRAST  Result Date: 05/21/2020 CLINICAL DATA:  59 year old male with shortness of breath, hyperkalemia, in stage renal disease on hemodialysis, cirrhosis, paroxysmal atrial fibrillation with RVR. EXAM: CT ANGIOGRAPHY CHEST WITH CONTRAST TECHNIQUE: Multidetector CT imaging of the chest was performed using the standard protocol during bolus administration of intravenous contrast. Multiplanar CT image reconstructions and MIPs were obtained to evaluate the vascular anatomy. CONTRAST:  72mL OMNIPAQUE IOHEXOL 300 MG/ML  SOLN COMPARISON:  Chest CTA 01/14/2020. FINDINGS: Cardiovascular: Good contrast bolus timing in the pulmonary arterial tree. No focal filling defect identified in the pulmonary arteries to suggest acute pulmonary embolism. Severe calcified atherosclerosis. Little contrast in the aorta on these images. Chronic cardiomegaly. No pericardial effusion. Calcified coronary artery atherosclerosis. Mediastinum/Nodes: No mediastinal lymphadenopathy. Lungs/Pleura: Larger lung volumes compared to the July CTA. Major airways are patent. Chronic elevation of the left hemidiaphragm and mild associated left lung base atelectasis. No evidence of pulmonary edema or pneumonia. Upper Abdomen: Chronically elevated left hemidiaphragm with subjacent low-density ascites. Volume of ascites appears decreased since July, overall small in the upper abdomen. Subtle liver nodularity. Musculoskeletal: Stable renal osteodystrophy. No acute osseous abnormality identified. Review of the MIP images confirms the above findings. IMPRESSION: 1.  Negative for acute pulmonary embolus. 2. Chronically elevated left hemidiaphragm with mild left lung base atelectasis. 3. Chronic cardiomegaly. Calcified coronary artery and Aortic Atherosclerosis (ICD10-I70.0). 4. Small volume ascites in the visible upper abdomen, decreased since July. Electronically Signed   By: Genevie Ann M.D.    On: 05/21/2020 19:03   IR Paracentesis  Result Date: 05/21/2020 INDICATION: Patient with history of ESRD, cirrhosis, hepatitis B and C, abdominal distension, recurrent ascites. Request made for diagnostic and therapeutic paracentesis. EXAM: ULTRASOUND GUIDED DIAGNOSTIC AND THERAPEUTIC PARACENTESIS MEDICATIONS: 10 mL 1% lidocaine COMPLICATIONS: None immediate. PROCEDURE: Informed written consent was obtained from the patient after a discussion of the risks, benefits and alternatives to treatment. A timeout was performed prior to the initiation of the procedure. Initial ultrasound scanning demonstrates a large amount of ascites within the left lower abdominal quadrant. The left lower abdomen was prepped and draped in the usual sterile fashion. 1% lidocaine was used for local anesthesia. Following this, a 19 gauge, 7-cm, Yueh catheter was introduced. An ultrasound image was saved for documentation purposes. The paracentesis was performed. The catheter was removed and a dressing was applied. The patient tolerated the procedure well without immediate post procedural complication. Patient received post-procedure intravenous albumin; see nursing notes for details. FINDINGS: A total of approximately 3.65 L of dark red fluid was removed. Samples were sent to the laboratory as requested by the clinical team. IMPRESSION: Successful ultrasound-guided paracentesis yielding 3.25 L of peritoneal fluid. Read by: Earley Abide, PA-C Electronically Signed   By: Aletta Edouard M.D.   On: 05/21/2020 16:10   Medications: . albumin human     . amiodarone  200 mg Oral Daily  . calcitRIOL  1.25 mcg Oral Q M,W,F-HD  . cefdinir  300 mg Oral Q M,W,F-1800  . Chlorhexidine Gluconate Cloth  6 each Topical Q0600  . cinacalcet  30 mg Oral Q M,W,F-HD  . diltiazem  120 mg Oral Daily  . gabapentin  300 mg Oral BID  . heparin  5,000 Units Subcutaneous Q8H  . metoprolol succinate  25 mg Oral Daily  . pantoprazole  40 mg Oral BID   . sevelamer carbonate  2,400 mg Oral TID WC  . tenofovir  300 mg Oral Q Mon   Dialysis orders: MWF [facility] 4hrs, 450 / 800, EDW 68kg 2K/2Ca, AVG mircera 60 q2wks  Last Hb 11 11/3 venofer 50 weekly sensipar 30 TIW, calctriol 1.25 TIW 10/20 phos 4.5 ca 9.5, PTH 781   Assessment/Plan **ESRD on HD:  HD Mon PM - early s/o.   HD yesterday.  Can meet EDW.  Encouraged him to run full tx times  **Hyperkalemia;  Resolved after HD.  Recent K 5.1 at outpt HD.  Encourage tx adherence.   **Anemia:   Hb 10s.  Follow   **Secondary hyperparathyroidism:  Cont sensipar 30 TIW, calcitriol 1.25 TIW, renvela 2400 TIDAC.   **HCV: s/p treatment  ** A fib, then ST:  No e/o PE on imaging.  Rate controlled now.  Per primary.   Dispo - ok for d/c per my perspective. Needs to attend scheduled dialysis tomorrow at home unit if discharges - reiterated running full tx to him today.  Jannifer Hick MD 05/22/2020, 7:27 AM  [facility] Pager: [phone]

## 2020-05-22 NOTE — Discharge Instructions (Signed)
Dear [name],   Thank you for letting us participate in your care! In this section, you will find a brief hospital admission summary of why you were admitted to the hospital, what happened during your admission, your diagnosis/diagnoses, and recommended follow up.   You were admitted because you were experiencing difficulty breathing.   You were diagnosed with Fluid Overload and high potassium.  You were treated with Dialysis.   You were also seen by Nephrology. They recommended continuing with Dialysis.   Your  improved and you were discharged from the hospital for meeting this goal.    POST-HOSPITAL & CARE INSTRUCTIONS 1. We changed your anitbiotics from Bactrim to Cefdenir, which you should take after Dialysis on days when you have it. 2. Please let PCP/Specialists know of any changes that were made.  3. Please continue to go to your scheduled Dialysis.  DOCTOR'S APPOINTMENT & FOLLOW UP CARE INSTRUCTIONS  Future Appointments  Date Time Provider [place]  06/12/2020  1:30 PM Adrian Prows, MD [facility] None  06/17/2020 11:10 AM Charlott Rakes, MD [facility] None     Thank you for choosing [facility]! Take care and be well!  [facility]  [address], [place] [address] [phone]

## 2020-05-22 NOTE — Progress Notes (Signed)
Requested to round on patient during HD treatment by HD RN. Patient requesting to sign off early from treatment due to itching. HD RN obtained po benadryl, but patient reports that will not work for him and request sarna lotion. This RN obtained order for lotion from nephrology PA. Lotion obtained from pharmacy for patient and applied to patient's back. Patient applied to all other extremities and completed full treatment. Patient appreciative of measures and breathing much better post treatment. Plan to follow as appropriate.  Mady Gemma, RN Dialysis Nurse Coordinator [phone]

## 2020-05-22 NOTE — Plan of Care (Signed)
Pt is adequate for D/C. °

## 2020-05-22 NOTE — Discharge Summary (Addendum)
[facility] Discharge Summary  Patient name: [name] Medical record number: [id number] Date of birth: [birth year] Age: 59 y.o. Gender: male Date of Admission: 05/19/2020  Date of Discharge: 05/22/20 Admitting Physician: Wilber Oliphant, MD  Primary Care Provider: Patient, No Pcp Per Consultants: Nephro  Indication for Hospitalization: Dyspnea, Hyperkalemia  Discharge Diagnoses/Problem List:  ESRD on HD M/W/F, PAF with RVR, HTN, HLD, pulmonary HTN, COPD, tobacco use, Hep B/C, Cirrhosis with recurrent ascites, CAD, anemia of chronic disease and dietary/medication noncompliance.  Disposition: Able to be discharged home safely  Discharge Condition: Stable  Discharge Exam:   Physical Exam Constitutional:      Appearance: Normal appearance.  HENT:     Head: Normocephalic and atraumatic.  Cardiovascular:     Rate and Rhythm: Normal rate and regular rhythm.     Pulses: Normal pulses.  Pulmonary:     Effort: Pulmonary effort is normal.     Breath sounds: Normal breath sounds.  Abdominal:     General: Abdomen is flat. Bowel sounds are normal. There is no distension.     Palpations: Abdomen is soft.     Tenderness: There is no abdominal tenderness.  Musculoskeletal:     Right lower leg: No edema.     Left lower leg: No edema.  Skin:    General: Skin is warm.  Neurological:     General: No focal deficit present.     Mental Status: He is alert.     Brief Hospital Course:  [name]  is a 59 y.o. male presenting with shortness of breath and found to have hyperkalemia with EKG changes. PMH is significant for ESRD on HD M/W/F, PAF with RVR, HTN, HLD, pulmonary HTN, COPD, tobacco use, Hep B/C, Cirrhosis with recurrent ascites, CAD, anemia of chronic disease and dietary/medication noncompliance.  Hyperkalemia with EKG changes  Hx of ESRD on HD M/W/F Initially had K of 6.9.  Evidence of Peaked T waves on EKG.  Had missed/left early  few dialysis appointments.  He was given calcium chloride 1g in the ED as well as insulin/dextrose.  Hyperkalemia resolved after first Dialysis treatment and remained in normal range throughout rest of hospital stay.  Was normal on time of discharge, and T waves less peaked by time of discharge.  Cirrhosis with recurrent ascites Patient has history of Chronic Hepatitis B. Patient on Prophylactic daily Bactrim and was changed to Cefdenir MWF after Dialysis at the recommendation of Pharm given Bactrim's potential for Hyperkalemia and QT prolongation and Cefdenir's coverage profile. Patient had therapeutic paracentesis while hospitalized where they removed 3.8 L of fluid.  No Perotineal signs during hospital stay.  For therapeutics paracentesis patient should follow-up with his hepatologist so that he can schedule these outpatient rather than having to come to the hospital for them.  Dyspnea  Hx of COPD, Tobacco Use Patint initally came in complaining of dyspnea.  Chest X-Ray obtained that showed mild vascular congestion.  O2 Sats remained > 90 on RA.  Treated with Albuterol 2 puffs q4hr PRN.  Dyspnea resolved with first dialysis treatment.  Paroxysmal A-Fib with RVR Patient initially came in with A-Fib with RVR after missing a few doses of his Metoprolol, Amiodarone and Diltiazem.  Was restarted on medications, but continued to be tachycardic for a day after dialysis.  A CTA was obtained due to rule out PE as possible cause for continued tachycardia. By 11/11 EKG was obtained showing rate-controlled Atrial Fibrillation.   Issues for  Follow Up:  1. SBP Prophylaxis- Changed anitbiotics from Bactrim to Cefdenir MWF after Dialysis [facility] Course) 2. Paracentesis-  F/u up with PCP provider to schedule future therapeutic paracenteses appointnments.   Significant Procedures: Hemodialysis, Paracentesis  Significant Labs and Imaging:  Recent Labs  Lab 05/20/20 0914 05/21/20 1643 05/22/20 0838   WBC 7.5 7.2 6.4  HGB 10.4* 10.9* 10.7*  HCT 30.6* 31.5* 30.7*  PLT 126* 147* 121*   Recent Labs  Lab 05/19/20 0934 05/19/20 0934 05/19/20 1750 05/19/20 1750 05/20/20 0226 05/20/20 0226 05/21/20 0834 05/21/20 0834 05/21/20 1643 05/22/20 0838  NA 136   < > 133*  --  135  --  133*  --  136 135  K 6.9*   < > 6.6*   < > 4.6   < > 4.9   < > 3.8 4.3  CL 96*   < > 95*  --  94*  --  94*  --  95* 96*  CO2 26   < > 24  --  27  --  25  --  28 27  GLUCOSE 86   < > 103*  --  75  --  127*  --  102* 106*  BUN 50*   < > 56*  --  28*  --  46*  --  15 24*  CREATININE 9.54*   < > 9.88*  --  6.07*  --  8.60*  --  4.31* 5.98*  CALCIUM 10.4*   < > 10.6*  --  10.0  --  9.9  --  9.7 9.2  MG 2.6*  --   --   --   --   --   --   --   --   --   PHOS  --   --   --   --   --   --   --   --   --  3.9  ALKPHOS 243*  --   --   --   --   --   --   --   --   --   AST 50*  --   --   --   --   --   --   --   --   --   ALT 32  --   --   --   --   --   --   --   --   --   ALBUMIN 3.6  --   --   --   --   --   --   --   --  3.2*   < > = values in this interval not displayed.     Results/Tests Pending at Time of Discharge: Paracentesis cultures  Discharge Medications:  Allergies as of 05/22/2020      Reactions   Tramadol Itching, Other (See Comments)   Grass Extracts [gramineae Pollens]    Other reaction(s): Sneezing   Morphine And Related Other (See Comments)   Stomach pain   Pollen Extract Other (See Comments)   Other reaction(s): Sneezing (finding)   Acetaminophen Nausea Only   Stomach ache   Aspirin Itching, Other (See Comments)   STOMACH PAIN   Clonidine Derivatives Itching      Medication List    STOP taking these medications   sulfamethoxazole-trimethoprim 800-160 MG tablet Commonly known as: BACTRIM DS     TAKE these medications   albuterol 108 (90 Base) MCG/ACT inhaler Commonly known as: VENTOLIN  HFA Inhale 2 puffs into the lungs every 4 (four) hours as needed for wheezing or  shortness of breath.   amiodarone 200 MG tablet Commonly known as: PACERONE Take 1 tablet (200 mg total) by mouth daily.   cefdinir 300 MG capsule Commonly known as: OMNICEF Take 1 capsule (300 mg total) by mouth every Monday, Wednesday, and Friday at 6 PM. Start taking on: May 23, 2020 Notes to patient: **NEW** To prevent infection in the belly. Take AFTER DIALYSIS. Replaces Bactrim- stop taking Bactrim.     diltiazem 120 MG 24 hr capsule Commonly known as: CARDIZEM CD Take 1 capsule (120 mg total) by mouth daily.   diphenhydrAMINE 25 mg capsule Commonly known as: BENADRYL Take 1 capsule (25 mg total) by mouth every 6 (six) hours as needed for itching or allergies.   gabapentin 300 MG capsule Commonly known as: NEURONTIN Take 300 mg by mouth in the morning and at bedtime.   metoprolol succinate 25 MG 24 hr tablet Commonly known as: Toprol XL Take 1 tablet (25 mg total) by mouth daily.   ondansetron 4 MG tablet Commonly known as: Zofran Take 1 tablet (4 mg total) by mouth every 8 (eight) hours as needed for nausea or vomiting.   Oxycodone HCl 10 MG Tabs Take 10 mg by mouth 3 (three) times daily as needed (pain).   pantoprazole 40 MG tablet Commonly known as: PROTONIX Take 1 tablet (40 mg total) by mouth 2 (two) times daily.   sevelamer carbonate 800 MG tablet Commonly known as: RENVELA Take 2,400 mg by mouth See admin instructions. Take 3 tablets (2400 mg) by mouth up to three times daily with meals   tenofovir 300 MG tablet Commonly known as: Viread Take 1 by mouth every Monday. What changed:   how much to take  how to take this  when to take this  additional instructions            Durable Medical Equipment  (From admission, onward)         Start     Ordered   05/22/20 1233  For home use only DME 3 n 1  Once        05/22/20 1232          Discharge Instructions: Please refer to Patient Instructions section of EMR for full details.   Patient was counseled important signs and symptoms that should prompt return to medical care, changes in medications, dietary instructions, activity restrictions, and follow up appointments.   Follow-Up Appointments:  Delora Fuel, MD 05/22/2020, 1:09 PM PGY-1, [place] Upper-Level Resident Addendum   I have independently interviewed and examined the patient. I have discussed the above with the original author and agree with their documentation. Please see also any attending notes.   Gifford Shave, MD PGY-2, [place] [facility] 05/23/2020 9:29 AM  [facility]PT[facility] Service pager: [phone] (text pages welcome through [facility])

## 2020-05-22 NOTE — Progress Notes (Signed)
Physical Therapy Treatment Patient Details Name: [name] MRN: [id number] DOB: May 08, 1961 Today's Date: 05/22/2020    History of Present Illness Pt is a 59 y/o male admitted secondary to increased SOB and tachycardia. Found to have hyperkalemia. S/p paracenthesis 11/10, CT negative PE 11/10.  PMH includes tobacco use, pulmonary HTN, Hep B, COPD, a fib, ESRD on HD, MWF, HTN, and CAD.     PT Comments    Pt was seen for mobility on hallway with checks of vitals during session. He was at 92% O2 sat and pulse was 102 max during session, hovering at 79 the latter part of the walk.  Follow up with PT to increase endurance and LE strength if dc is not completed, and will otherwise encourage more mobility at home with family and friends.     Follow Up Recommendations  No PT follow up     Equipment Recommendations  None recommended by PT    Recommendations for Other Services       Precautions / Restrictions Precautions Precautions: Other (comment) Precaution Comments: watch HR  Restrictions Weight Bearing Restrictions: No    Mobility  Bed Mobility Overal bed mobility: Modified Independent                Transfers Overall transfer level: Modified independent Equipment used: None Transfers: Sit to/from Stand Sit to Stand: Modified independent (Device/Increase time)         General transfer comment: PT supervised but pt is mod I  Ambulation/Gait Ambulation/Gait assistance: Min guard Gait Distance (Feet): 375 Feet Assistive device: None Gait Pattern/deviations: Step-through pattern;Decreased stride length;Wide base of support Gait velocity: reduced Gait velocity interpretation: <1.31 ft/sec, indicative of household ambulator General Gait Details: steps with no RW on hallway, good response to the session   Stairs             Wheelchair Mobility    Modified Rankin (Stroke Patients Only)       Balance Overall balance assessment: Modified  Independent                                          Cognition Arousal/Alertness: Awake/alert Behavior During Therapy: WFL for tasks assessed/performed Overall Cognitive Status: No family/caregiver present to determine baseline cognitive functioning                                 General Comments: does require assistance for safety decisions such as how much distance is reasonable and how much time can pt walk with HR controlled      Exercises      General Comments General comments (skin integrity, edema, etc.): pt is not demonstrating LOB with gait      Pertinent Vitals/Pain Pain Assessment: No/denies pain    Home Living                      Prior Function            PT Goals (current goals can now be found in the care plan section) Acute Rehab PT Goals Patient Stated Goal: to get home  Progress towards PT goals: Progressing toward goals    Frequency    Min 3X/week      PT Plan Current plan remains appropriate    Co-evaluation  AM-PAC PT "6 Clicks" Mobility   Outcome Measure  Help needed turning from your back to your side while in a flat bed without using bedrails?: None Help needed moving from lying on your back to sitting on the side of a flat bed without using bedrails?: None Help needed moving to and from a bed to a chair (including a wheelchair)?: None Help needed standing up from a chair using your arms (e.g., wheelchair or bedside chair)?: A Little Help needed to walk in hospital room?: A Little Help needed climbing 3-5 steps with a railing? : A Little 6 Click Score: 21    End of Session Equipment Utilized During Treatment: Gait belt Activity Tolerance: Treatment limited secondary to medical complications (Comment) Patient left: in bed;with call bell/phone within reach Nurse Communication: Mobility status PT Visit Diagnosis: Other abnormalities of gait and mobility (R26.89)     Time:  1340-1400 PT Time Calculation (min) (ACUTE ONLY): 20 min  Charges:  $Gait Training: 8-22 mins                    Ramond Dial 05/22/2020, 3:09 PM  Mee Hives, PT MS Acute Rehab Dept. Number: [place] [facility] [place]

## 2020-05-22 NOTE — Progress Notes (Signed)
The patient endorses feeling well today and requesting to be d/ced home.  His physical exam today was benign without acute findings.  Paroxysmal Afib with RVR: Rate controlled on his current regimen He will benefit from outpatient f/u soon with his cardiologist.  Liver cirrhosis with recurrent ascites: S/O paracentesis yesterday. Abdominal exam is benign. F/U fluid analysis report as outpatient. F/U with GI outpatient.  ESRD on HD with Hyperkalemia of admission Potassium level improved s/p dialysis Continue outpatient dialysis per schedule  I will cosign the resident's D/C summary once it is completed.

## 2020-05-22 NOTE — Evaluation (Signed)
Occupational Therapy Evaluation Patient Details Name: [name] MRN: [id number] DOB: [birth year] Today's Date: 05/22/2020    History of Present Illness Pt is a 59 y/o male admitted secondary to increased SOB and tachycardia. Found to have hyperkalemia. S/p paracenthesis 11/10, CT negative PE 11/10.  PMH includes tobacco use, pulmonary HTN, Hep B, COPD, a fib, ESRD on HD, MWF, HTN, and CAD.    Clinical Impression   PTA patient reports independent with mobility, assist as needed from PCA (daily 3-4 hours) for ADLs/IADLs, but driving. Admitted for above and limited by problem list below, including generalized weakness and decreased activity tolerance.  Patient currently requires supervision for ADLs and functional mobility, requires increased time for all tasks.  Intermittent cueing for safety, anticipate cognition is baseline.  Patient HR monitored throughout session, 85 at rest and increased to 99 after grooming at sink, but mobility in hallway increased HR to 140; quickly recovered once returned to bed at 85.  He reports limited tolerance due to remaining relatively inactive at home.  He will benefit from further OT services acutely to optimize independence, safety and review energy conservation techniques/ safety prior to dc but anticipate no further needs after dc home.      Follow Up Recommendations  No OT follow up;Supervision - Intermittent    Equipment Recommendations  3 in 1 bedside commode    Recommendations for Other Services       Precautions / Restrictions Precautions Precautions: Other (comment) Precaution Comments: watch HR  Restrictions Weight Bearing Restrictions: No      Mobility Bed Mobility Overal bed mobility: Modified Independent             General bed mobility comments: no assist required, pt elevated HOB     Transfers Overall transfer level: Needs assistance Equipment used: None Transfers: Sit to/from Stand Sit to Stand: Supervision          General transfer comment: for safety     Balance Overall balance assessment: Mild deficits observed, not formally tested                                         ADL either performed or assessed with clinical judgement   ADL Overall ADL's : Needs assistance/impaired     Grooming: Supervision/safety;Standing   Upper Body Bathing: Supervision/ safety;Set up;Sitting   Lower Body Bathing: Supervison/ safety;Set up;Sit to/from stand   Upper Body Dressing : Set up;Sitting   Lower Body Dressing: Supervision/safety;Sit to/from stand Lower Body Dressing Details (indicate cue type and reason): increased effort to don socks, but no assist required Toilet Transfer: Supervision/safety;Ambulation Toilet Transfer Details (indicate cue type and reason): simulated in room         Functional mobility during ADLs: Supervision/safety;Cueing for safety General ADL Comments: pt limited by decreased activity tolerance and generalized weakness      Vision   Vision Assessment?: No apparent visual deficits     Perception     Praxis      Pertinent Vitals/Pain Pain Assessment: No/denies pain     Hand Dominance Right   Extremity/Trunk Assessment Upper Extremity Assessment Upper Extremity Assessment: Generalized weakness   Lower Extremity Assessment Lower Extremity Assessment: Defer to PT evaluation   Cervical / Trunk Assessment Cervical / Trunk Assessment: Normal   Communication Communication Communication: No difficulties   Cognition Arousal/Alertness: Awake/alert Behavior During Therapy: WFL for tasks assessed/performed Overall Cognitive  Status: No family/caregiver present to determine baseline cognitive functioning                                 General Comments: some decreased safety awareness, but anticipate near baseline. Oriented, follows commands, and cooperative.    General Comments  HR monitored during session--at rest 85, up to 99  after self care at sink, up to 140 with ambulation; recovered quickly once returned to bed at 85    Exercises     Shoulder [place] expects to be discharged to:: Private residence Living Arrangements: Alone Available Help at Discharge: Friend(s);Available PRN/intermittently;Personal care attendant Type of Home: Apartment Home Access: Stairs to enter Entrance Stairs-Number of Steps: 2 Entrance Stairs-Rails: None Home Layout: One level     Bathroom Shower/Tub: Tub/shower unit;Curtain   Biochemist, clinical: Standard     Home Equipment: Environmental consultant - 2 wheels;Cane - single point;Shower seat   Additional Comments: reports PCA daily for 3-4 hours       Prior Functioning/Environment Level of Independence: Independent        Comments: no AD needed, reports some assist from aide with ADls/IADLs as needed; driving         OT Problem List: Decreased strength;Decreased activity tolerance;Decreased knowledge of use of DME or AE;Decreased knowledge of precautions;Cardiopulmonary status limiting activity      OT Treatment/Interventions: Self-care/ADL training;DME and/or AE instruction;Energy conservation;Therapeutic exercise;Therapeutic activities;Patient/family education    OT Goals(Current goals can be found in the care plan section) Acute Rehab OT Goals Patient Stated Goal: to get home  OT Goal Formulation: With patient Time For Goal Achievement: 06/05/20 Potential to Achieve Goals: Good  OT Frequency: Min 2X/week   Barriers to D/C:            Co-evaluation              AM-PAC OT "6 Clicks" Daily Activity     Outcome Measure Help from another person eating meals?: None Help from another person taking care of personal grooming?: A Little Help from another person toileting, which includes using toliet, bedpan, or urinal?: A Little Help from another person bathing (including washing, rinsing, drying)?: A Little Help from another person  to put on and taking off regular upper body clothing?: A Little Help from another person to put on and taking off regular lower body clothing?: A Little 6 Click Score: 19   End of Session Nurse Communication: Mobility status  Activity Tolerance: Patient tolerated treatment well Patient left: in bed;with call bell/phone within reach  OT Visit Diagnosis: Other abnormalities of gait and mobility (R26.89);Muscle weakness (generalized) (M62.81)                Time: 3875-6433 OT Time Calculation (min): 25 min Charges:  OT General Charges $OT Visit: 1 Visit OT Evaluation $OT Eval Moderate Complexity: 1 Mod OT Treatments $Self Care/Home Management : 8-22 mins  Jolaine Artist, OT Acute Rehabilitation Services Pager [phone] Office [phone]   Delight Stare 05/22/2020, 9:31 AM

## 2020-05-22 NOTE — Plan of Care (Signed)
  Problem: Education: Goal: Knowledge of General Education information will improve Description: Including pain rating scale, medication(s)/side effects and non-pharmacologic comfort measures Outcome: Progressing   Problem: Health Behavior/Discharge Planning: Goal: Ability to manage health-related needs will improve Outcome: Progressing   Problem: Activity: Goal: Risk for activity intolerance will decrease Outcome: Progressing   

## 2020-05-23 ENCOUNTER — Telehealth: Payer: Self-pay | Admitting: Nurse Practitioner

## 2020-05-23 NOTE — Telephone Encounter (Signed)
Transition of care contact from inpatient facility  Date of Discharge: 05/22/2020 Date of Contact: 05/23/2020 Method of contact: Phone  Attempted to contact patient to discuss transition of care from inpatient admission. Patient did not answer the phone. Message was left on the patient's voicemail with call back number [phone].

## 2020-05-26 LAB — CULTURE, BODY FLUID W GRAM STAIN -BOTTLE: Culture: NO GROWTH

## 2020-06-10 ENCOUNTER — Ambulatory Visit (HOSPITAL_COMMUNITY)
Admission: RE | Admit: 2020-06-10 | Discharge: 2020-06-10 | Disposition: A | Discharge status: Home / Self Care | Payer: Medicare Other | Source: Ambulatory Visit | Attending: Gastroenterology | Admitting: Gastroenterology

## 2020-06-10 ENCOUNTER — Other Ambulatory Visit: Payer: Self-pay

## 2020-06-10 ENCOUNTER — Other Ambulatory Visit (HOSPITAL_COMMUNITY): Payer: Self-pay | Admitting: Gastroenterology

## 2020-06-10 DIAGNOSIS — K7460 Unspecified cirrhosis of liver: Secondary | ICD-10-CM

## 2020-06-10 DIAGNOSIS — R188 Other ascites: Secondary | ICD-10-CM

## 2020-06-10 IMAGING — DX DG ABDOMEN ACUTE W/ 1V CHEST
3 series · 3 of 3 positions shown · non-contrast
Comparison: Chest radiographs dated 09/21/2018. CT abdomen/pelvis
dated 08/03/2018.

CLINICAL DATA: Constipation x3 days

EXAM:
DG ABDOMEN ACUTE W/ 1V CHEST

[chest pa]
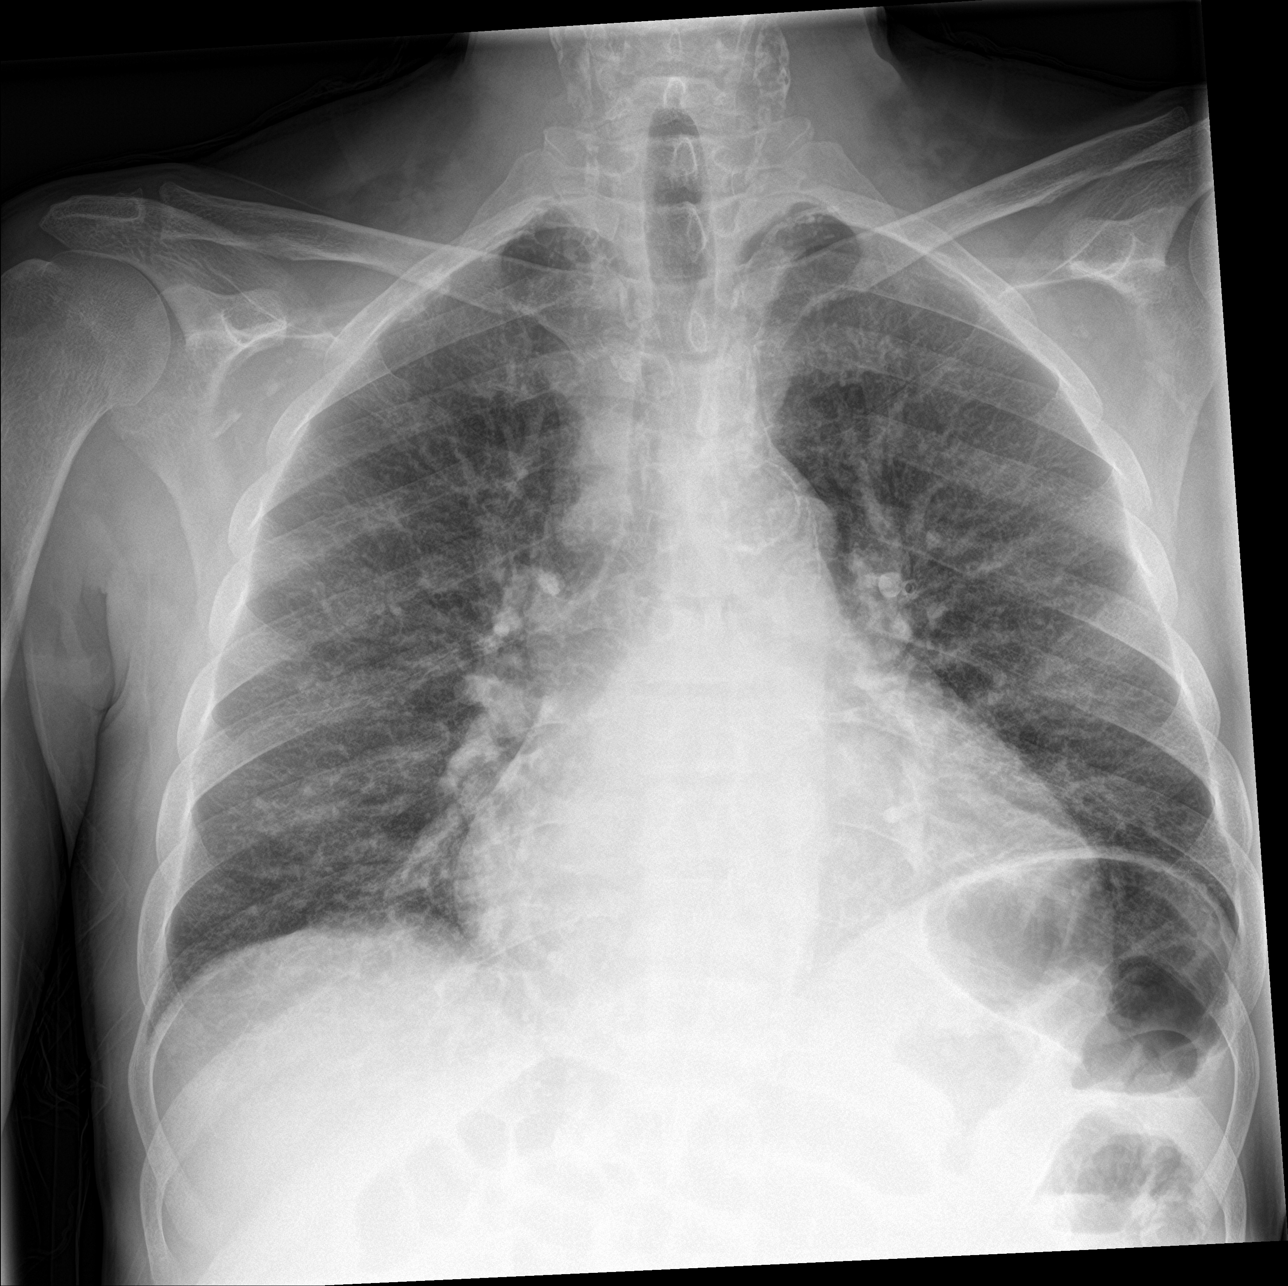

[abdomen erect]
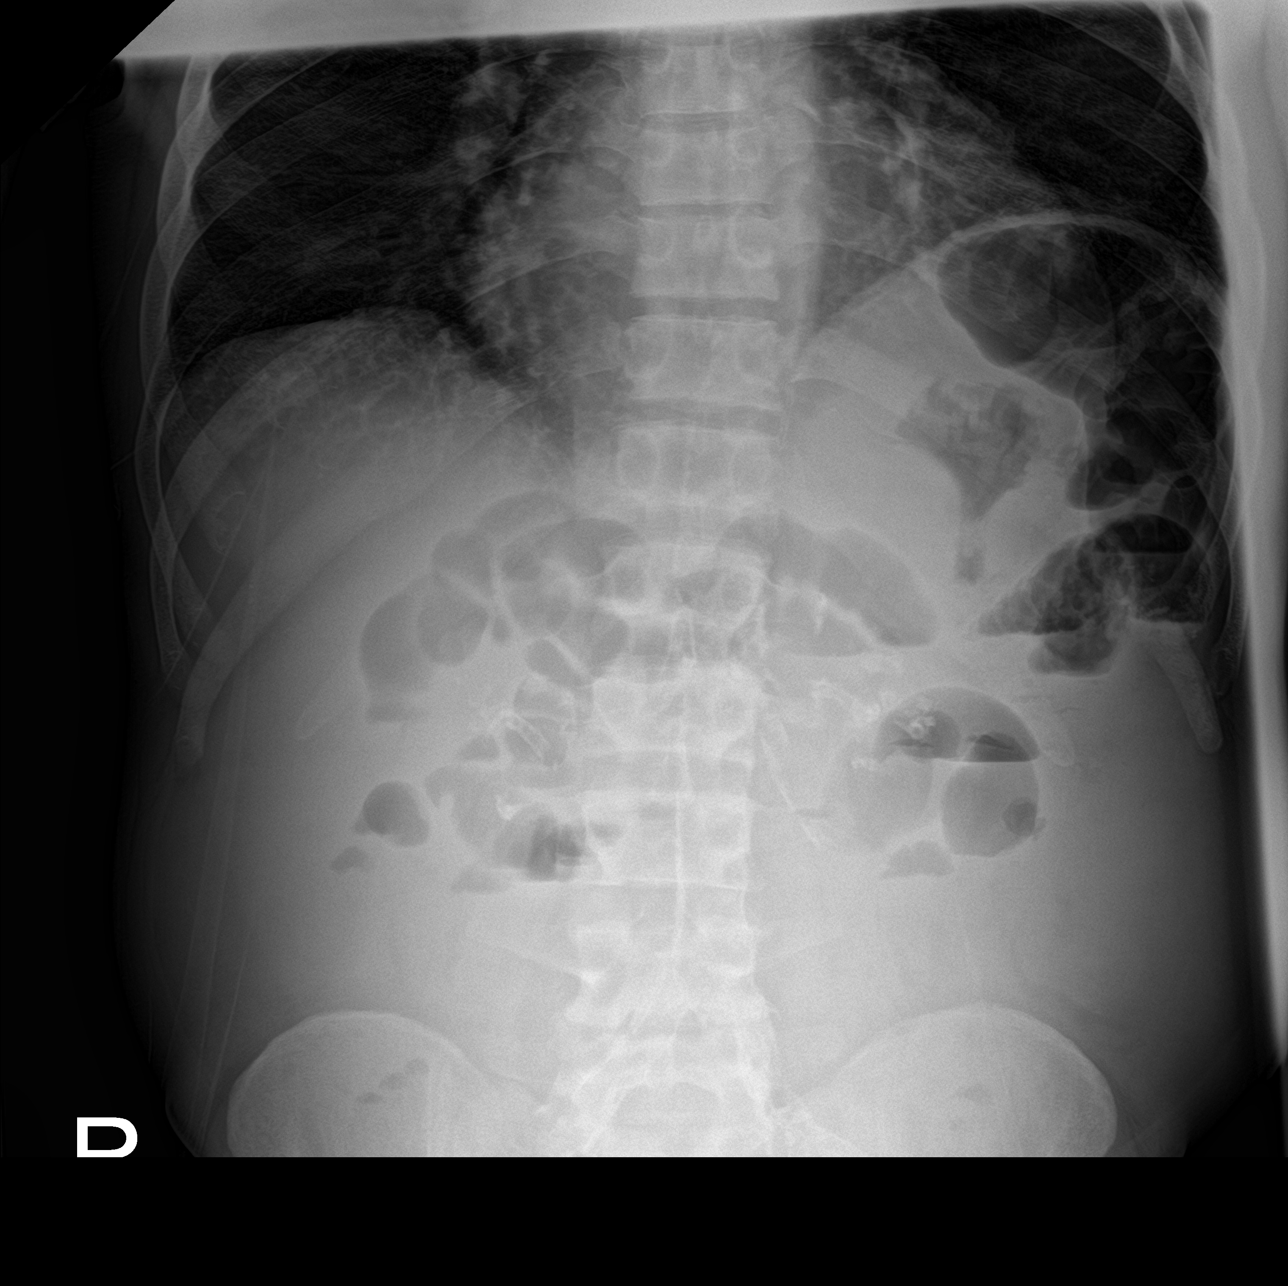

[abdomen supine]
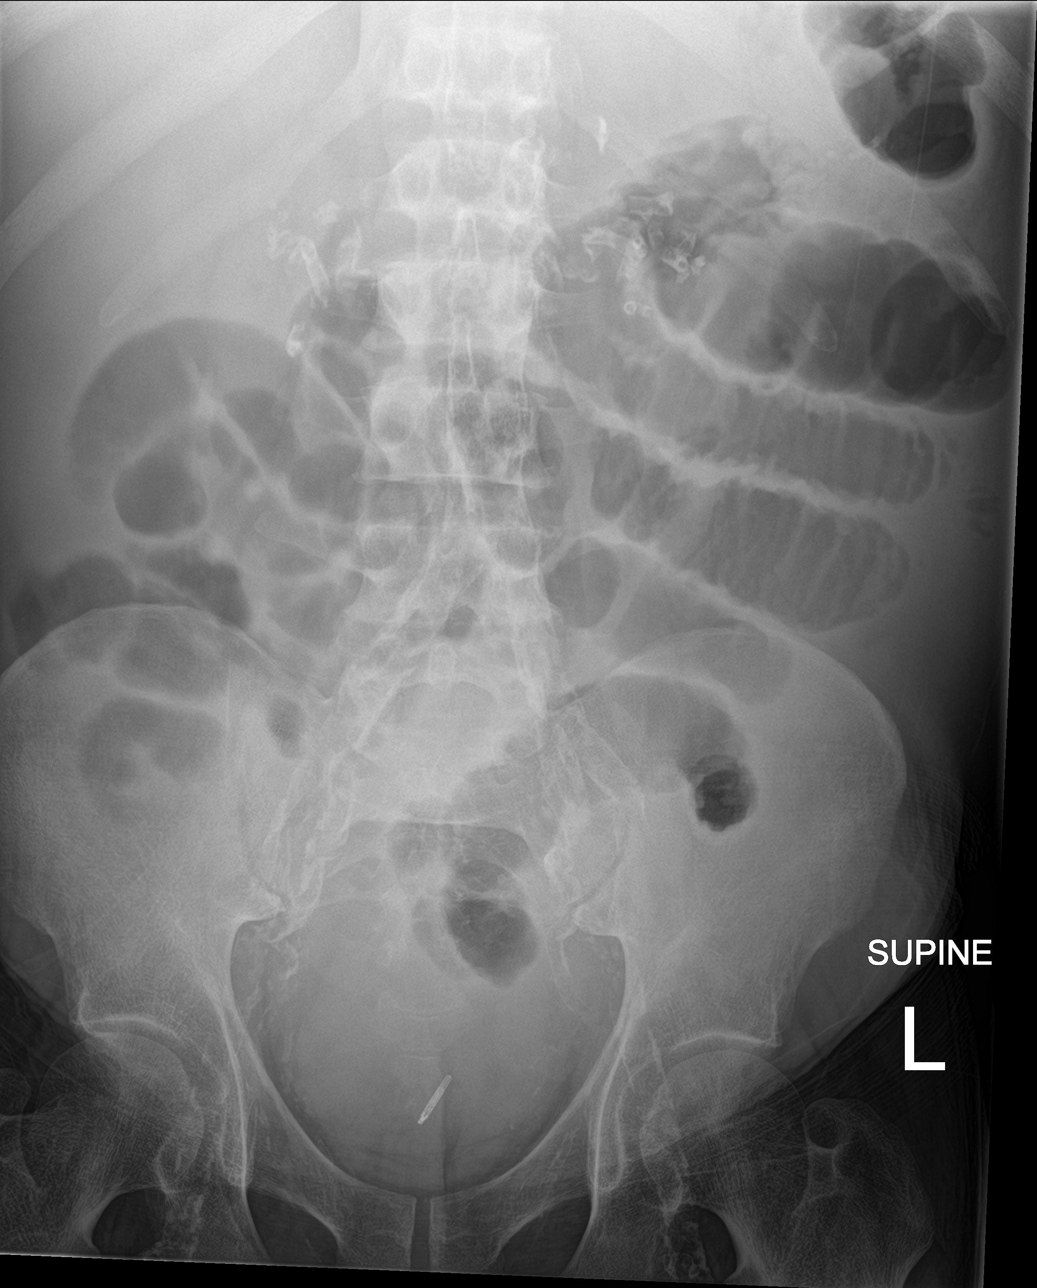

[3 of 3 positions shown; findings below may reference images not displayed]

FINDINGS: Lungs are essentially clear. Mild bibasilar atelectasis. No pleural
effusion or pneumothorax.

Cardiomegaly.  Thoracic aortic atherosclerosis.

Mildly dilated loops of small bowel in the central abdomen, raising
the possibility of partial small bowel obstruction. However, the
splenic flexure is visualized and appears normal (not decompressed).

Paucity of bowel loops in the periphery suggest ascites, which may
also exacerbate this appearance.

Normal colonic stool burden.

No evidence of free air under the diaphragm on the upright view.

Vascular calcifications.

Visualized osseous structures are within normal limits.
IMPRESSION: Normal colonic stool burden.

Paucity of bowel loops in the periphery of the abdomen suggests
ascites.

Mildly dilated loops of small bowel in the central abdomen at least
raises the possibility of partial small bowel obstruction, although
this is equivocal.

Mild bibasilar atelectasis.

## 2020-06-10 NOTE — Progress Notes (Signed)
Patient presented to IR for therapeutic paracentesis. No fluid identified on limited abdominal ultrasound amenable to percutaneous intervention. Patient states he feels good and does not feel like he has ascites. No procedure performed today, patient discharged home.   Dr. Kathlene Cote notified.  Soyla Dryer, [address] [phone] 06/10/2020, 8:57 AM

## 2020-06-11 ENCOUNTER — Other Ambulatory Visit: Payer: Self-pay | Admitting: Cardiology

## 2020-06-12 ENCOUNTER — Ambulatory Visit: Payer: Medicare Other | Admitting: Cardiology

## 2020-06-16 ENCOUNTER — Encounter (HOSPITAL_COMMUNITY): Payer: Self-pay

## 2020-06-16 ENCOUNTER — Emergency Department (HOSPITAL_COMMUNITY): Payer: Medicare Other

## 2020-06-16 ENCOUNTER — Emergency Department (HOSPITAL_COMMUNITY)
Admission: EM | Admit: 2020-06-16 | Discharge: 2020-06-17 | Disposition: A | Discharge status: Home / Self Care | Payer: Medicare Other | Attending: Pediatrics | Admitting: Pediatrics

## 2020-06-16 DIAGNOSIS — Z5321 Procedure and treatment not carried out due to patient leaving prior to being seen by health care provider: Secondary | ICD-10-CM | POA: Insufficient documentation

## 2020-06-16 DIAGNOSIS — R456 Violent behavior: Secondary | ICD-10-CM | POA: Insufficient documentation

## 2020-06-16 DIAGNOSIS — R0602 Shortness of breath: Secondary | ICD-10-CM | POA: Insufficient documentation

## 2020-06-16 NOTE — ED Triage Notes (Signed)
Pt comes via [facility] for SOB, received dialysis today, did not get all of his treatment, reports they didn't take off enough, pt aggressive towards staff in triage. Pt 94% on RA, pt demanding to be put on oxygen

## 2020-06-17 ENCOUNTER — Inpatient Hospital Stay: Payer: Medicare Other | Admitting: Family Medicine

## 2020-06-17 LAB — BASIC METABOLIC PANEL
Anion gap: 16 — ABNORMAL HIGH (ref 5–15)
BUN: 18 mg/dL (ref 6–20)
CO2: 28 mmol/L (ref 22–32)
Calcium: 9.7 mg/dL (ref 8.9–10.3)
Chloride: 90 mmol/L — ABNORMAL LOW (ref 98–111)
Creatinine, Ser: 5.23 mg/dL — ABNORMAL HIGH (ref 0.61–1.24)
GFR, Estimated: 12 mL/min — ABNORMAL LOW (ref >60)
Glucose, Bld: 94 mg/dL (ref 70–99)
Potassium: 4.9 mmol/L (ref 3.5–5.1)
Sodium: 134 mmol/L — ABNORMAL LOW (ref 135–145)

## 2020-06-17 LAB — CBC
HCT: 30.1 % — ABNORMAL LOW (ref 39.0–52.0)
Hemoglobin: 10.3 g/dL — ABNORMAL LOW (ref 13.0–17.0)
MCH: 28.4 pg (ref 26.0–34.0)
MCHC: 34.2 g/dL (ref 30.0–36.0)
MCV: 82.9 fL (ref 80.0–100.0)
Platelets: 127 10*3/uL — ABNORMAL LOW (ref 150–400)
RBC: 3.63 MIL/uL — ABNORMAL LOW (ref 4.22–5.81)
RDW: 17.7 % — ABNORMAL HIGH (ref 11.5–15.5)
WBC: 7.9 10*3/uL (ref 4.0–10.5)
nRBC: 0 % (ref 0.0–0.2)

## 2020-06-17 NOTE — ED Notes (Signed)
Pt called for vitals x2, no answer 

## 2020-06-18 IMAGING — US ULTRASOUND ABDOMEN LIMITED
1 series · 4 of 4 positions shown · non-contrast
Comparison: 12/17/2018

CLINICAL DATA: CIRRHOSIS, ASCITES

EXAM:
LIMITED ABDOMEN ULTRASOUND FOR ASCITES
TECHNIQUE: Limited ultrasound survey for ascites was performed in all four
abdominal quadrants.

[Series 1: ir (id) (id)/(id)/(id) ir · 4 of 4 slices shown]
[im 1/4]
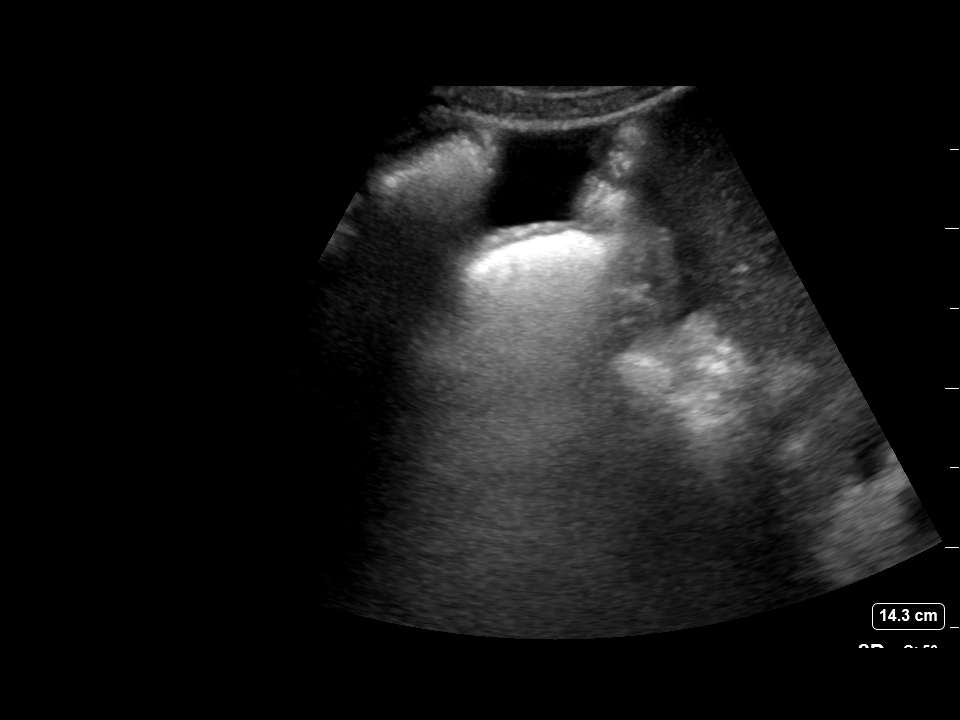
[im 2/4]
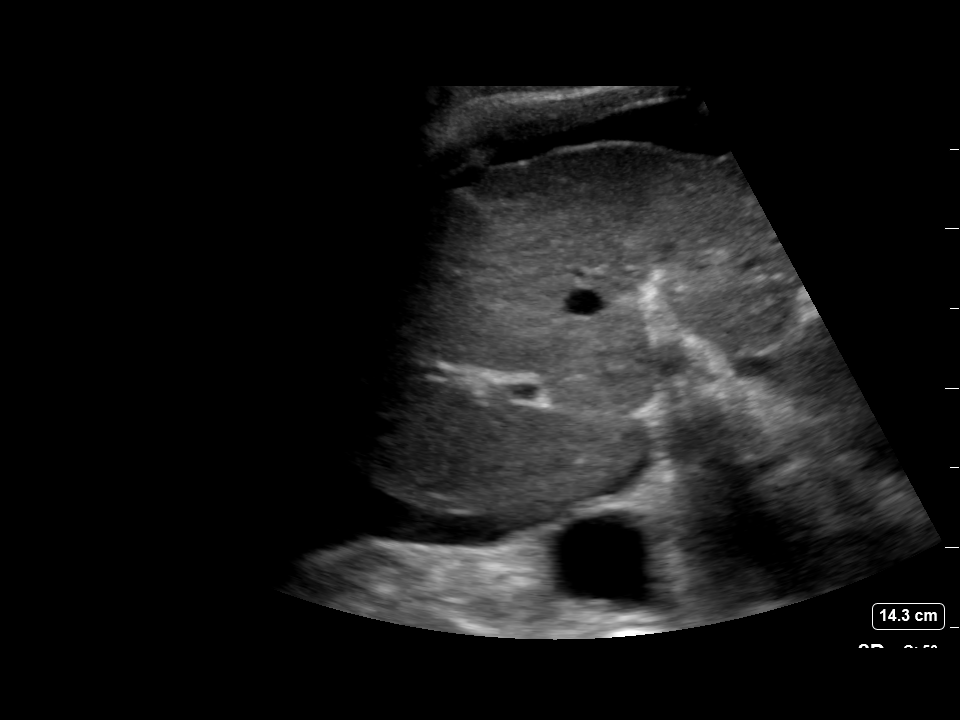
[im 3/4]
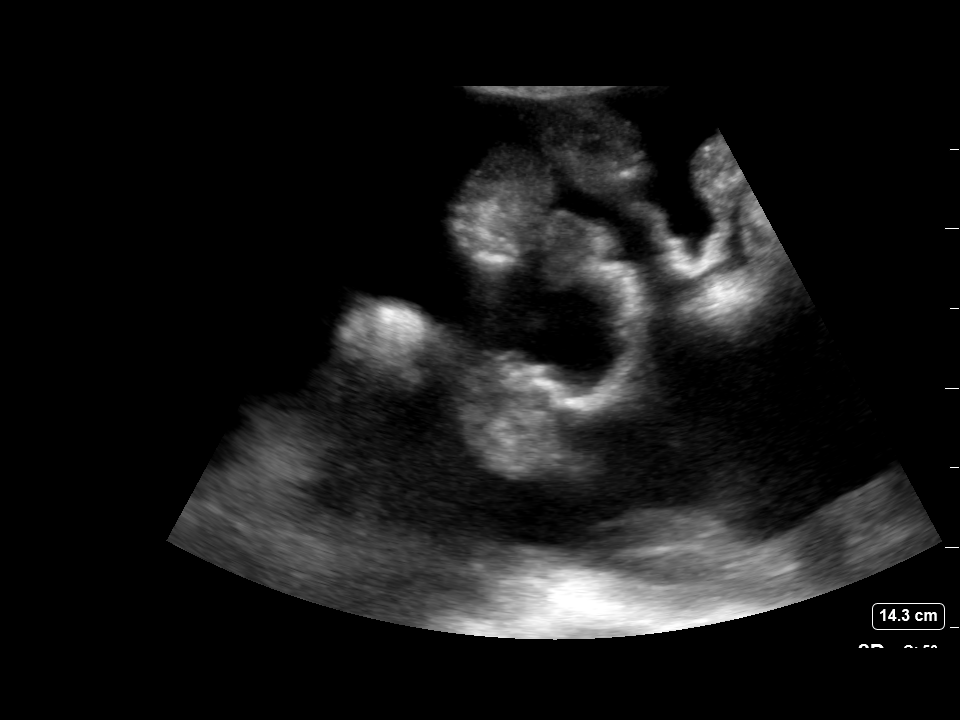
[im 4/4]
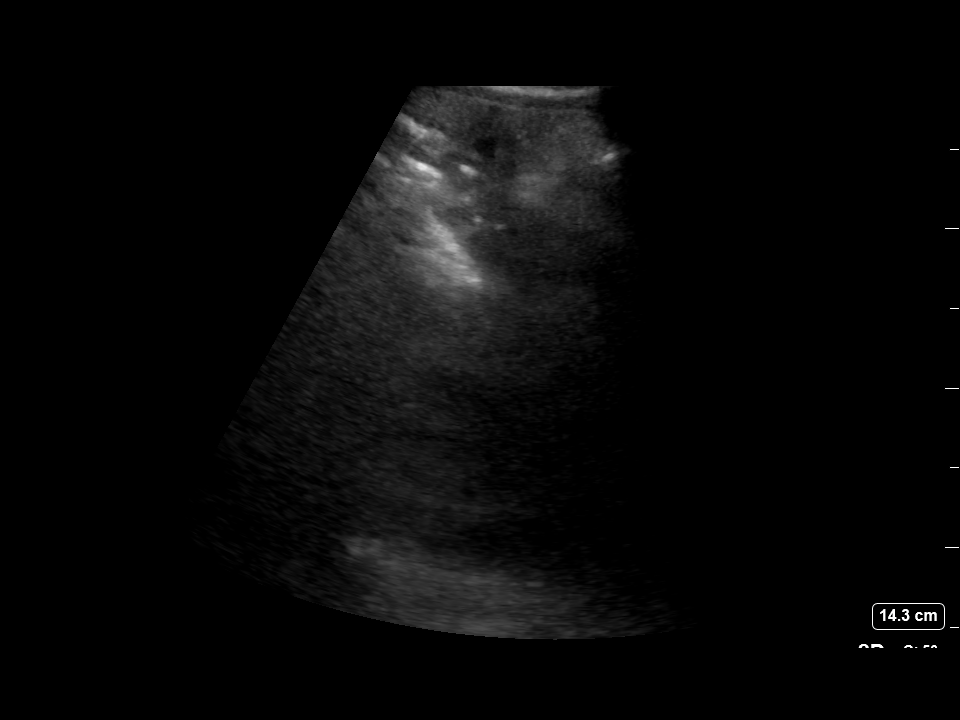

[4 of 4 positions shown; findings below may reference images not displayed]

FINDINGS: Survey of the abdominal 4 quadrants demonstrates a small amount of
abdominopelvic ascites. Not enough to warrant therapeutic
paracentesis. Procedure not performed. Abdomen can be reassessed if
there is further distention and discomfort.
IMPRESSION: Small amount of abdominopelvic ascites by ultrasound. Paracentesis
not performed. See above comment.

## 2020-06-19 ENCOUNTER — Other Ambulatory Visit: Payer: Self-pay | Admitting: Cardiology

## 2020-06-19 DIAGNOSIS — R110 Nausea: Secondary | ICD-10-CM

## 2020-06-19 DIAGNOSIS — K219 Gastro-esophageal reflux disease without esophagitis: Secondary | ICD-10-CM

## 2020-06-26 ENCOUNTER — Other Ambulatory Visit: Payer: Self-pay

## 2020-06-26 ENCOUNTER — Ambulatory Visit: Payer: Medicare Other | Attending: Family Medicine | Admitting: Family Medicine

## 2020-06-26 ENCOUNTER — Encounter: Payer: Self-pay | Admitting: Family Medicine

## 2020-06-26 VITALS — BP 113/62 | HR 68 | Ht 69.0 in | Wt 158.0 lb

## 2020-06-26 DIAGNOSIS — R188 Other ascites: Secondary | ICD-10-CM

## 2020-06-26 DIAGNOSIS — K219 Gastro-esophageal reflux disease without esophagitis: Secondary | ICD-10-CM

## 2020-06-26 DIAGNOSIS — M792 Neuralgia and neuritis, unspecified: Secondary | ICD-10-CM

## 2020-06-26 DIAGNOSIS — I480 Paroxysmal atrial fibrillation: Secondary | ICD-10-CM

## 2020-06-26 DIAGNOSIS — R110 Nausea: Secondary | ICD-10-CM | POA: Diagnosis not present

## 2020-06-26 DIAGNOSIS — Z992 Dependence on renal dialysis: Secondary | ICD-10-CM

## 2020-06-26 DIAGNOSIS — N186 End stage renal disease: Secondary | ICD-10-CM

## 2020-06-26 DIAGNOSIS — K7460 Unspecified cirrhosis of liver: Secondary | ICD-10-CM | POA: Diagnosis not present

## 2020-06-26 MED ORDER — GABAPENTIN 300 MG PO CAPS: 300.0000 mg | ORAL_CAPSULE | Freq: Two times a day (BID) | ORAL | 3 refills | Status: DC

## 2020-06-26 MED ORDER — ONDANSETRON HCL 4 MG PO TABS: ORAL_TABLET | ORAL | 3 refills | Status: DC

## 2020-06-26 NOTE — Progress Notes (Signed)
Subjective:  Patient ID: [name], male    DOB: 04/02/1961  Age: 59 y.o. MRN: [id number]  CC: Hospitalization Follow-up   HPI [name] is a 59 year old male with a h/o ESRD on HD (MWF), Lver Cirrhosis with ascites (undergoing intermittent paracentesis), Chronic Hepatitis B, Atrial Fibrillation, Hypertension, Hyperlipidemia who presents today to establish care. He was hospitalized at [facility] from 11/8-11/11/21 after he presented with dyspnea and he was found to be in Afib,  Hyperkalemic with EKG changes which was treated with IV insulin and dextrose , Calcium Carbonate and he underwent a session of HD with resolution of hyperkalemia. During his hospital course he underwent abdominal paracenteses and his Bactrim prophylaxis was changed to Cefdinir due to potential for hyperkalemia with the former.   Previously followed by his PCP Dustin Folks whom he has not seen in a while. His Cardiologist is Dr Einar Gip He has an upcoming appointment with [facility] Col[facility] to be evaluated for liver and kidney transplant Takes Gabapentin for neuropathic pain in arms and legs He states he would not like me to place standing orders for Paracentesis as he states he calls the radiology department when he is dyspneic to set up his paracentesis He has no concerns today and would like refill of Zofran and Gabapentin.  Past Medical History:  Diagnosis Date  . Anemia   . Anxiety   . Arthritis    left shoulder  . Atherosclerosis of aorta ([place])   . Cardiomegaly   . Chest pain    DATE UNKNOWN, C/O PERIODICALLY  . Cocaine abuse ([place])   . COPD exacerbation ([place]) 08/17/2016  . Coronary artery disease    stent 02/22/17  . ESRD (end stage renal disease) on dialysis ([place])    "[name]; MWF" (07/04/2017)  . GERD (gastroesophageal reflux disease)    DATE UNKNOWN  . Hemorrhoids   . Hepatitis B, chronic ([place])   . Hepatitis C   . History of kidney stones   . Hyperkalemia   .  Hypertension   . Kidney failure   . Metabolic bone disease    Patient denies  . Mitral stenosis   . Myocardial infarction ([place])   . Pneumonia   . Pulmonary edema   . Solitary rectal ulcer syndrome 07/2017   at flex sig for rectal bleeding  . Tubular adenoma of colon     Past Surgical History:  Procedure Laterality Date  . A/V FISTULAGRAM Left 05/26/2017   Procedure: A/V FISTULAGRAM;  Surgeon: Conrad [place], MD;  Location: [place] [facility];  Service: Cardiovascular;  Laterality: Left;  . A/V FISTULAGRAM Right 11/18/2017   Procedure: A/V FISTULAGRAM - Right Arm;  Surgeon: Elam Dutch, MD;  Location: [place] [facility];  Service: Cardiovascular;  Laterality: Right;  . APPLICATION OF WOUND VAC Left 06/14/2017   Procedure: APPLICATION OF WOUND VAC;  Surgeon: Katha Cabal, MD;  Location: [facility];  Service: Vascular;  Laterality: Left;  . AV FISTULA PLACEMENT  2012   BELIEVED WAS PLACED IN JUNE  . AV FISTULA PLACEMENT Right 08/09/2017   Procedure: Creation Right arm ARTERIOVENOUS BRACHIOCEPOHALIC FISTULA;  Surgeon: Elam Dutch, MD;  Location: [facility] OR;  Service: Vascular;  Laterality: Right;  . AV FISTULA PLACEMENT Right 11/22/2017   Procedure: INSERTION OF ARTERIOVENOUS (AV) GORE-TEX GRAFT RIGHT UPPER ARM;  Surgeon: Elam Dutch, MD;  Location: [place];  Service: Vascular;  Laterality: Right;  . BIOPSY  01/25/2018  Procedure: BIOPSY;  Surgeon: Jerene Bears, MD;  Location: [place];  Service: Gastroenterology;;  . BIOPSY  04/10/2019   Procedure: BIOPSY;  Surgeon: Jerene Bears, MD;  Location: [facility];  Service: Gastroenterology;;  . COLONOSCOPY    . COLONOSCOPY WITH PROPOFOL N/A 01/25/2018   Procedure: COLONOSCOPY WITH PROPOFOL;  Surgeon: Jerene Bears, MD;  Location: [place];  Service: Gastroenterology;  Laterality: N/A;  . CORONARY STENT INTERVENTION N/A 02/22/2017   Procedure: CORONARY STENT INTERVENTION;  Surgeon: Nigel Mormon, MD;  Location: [place] [facility];  Service: Cardiovascular;  Laterality: N/A;  . ESOPHAGOGASTRODUODENOSCOPY (EGD) WITH PROPOFOL N/A 01/25/2018   Procedure: ESOPHAGOGASTRODUODENOSCOPY (EGD) WITH PROPOFOL;  Surgeon: Jerene Bears, MD;  Location: [place];  Service: Gastroenterology;  Laterality: N/A;  . ESOPHAGOGASTRODUODENOSCOPY (EGD) WITH PROPOFOL N/A 04/10/2019   Procedure: ESOPHAGOGASTRODUODENOSCOPY (EGD) WITH PROPOFOL;  Surgeon: Jerene Bears, MD;  Location: [facility];  Service: Gastroenterology;  Laterality: N/A;  . FLEXIBLE SIGMOIDOSCOPY N/A 07/15/2017   Procedure: FLEXIBLE SIGMOIDOSCOPY;  Surgeon: Carol Ada, MD;  Location: [place];  Service: Endoscopy;  Laterality: N/A;  . HEMORRHOID BANDING    . I & D EXTREMITY Left 06/01/2017   Procedure: IRRIGATION AND DEBRIDEMENT LEFT ARM HEMATOMA WITH LIGATION OF LEFT ARM AV FISTULA;  Surgeon: Elam Dutch, MD;  Location: [place];  Service: Vascular;  Laterality: Left;  . I & D EXTREMITY Left 06/14/2017   Procedure: IRRIGATION AND DEBRIDEMENT EXTREMITY;  Surgeon: Katha Cabal, MD;  Location: [facility];  Service: Vascular;  Laterality: Left;  . INSERTION OF DIALYSIS CATHETER  05/30/2017  . INSERTION OF DIALYSIS CATHETER N/A 05/30/2017   Procedure: INSERTION OF DIALYSIS CATHETER;  Surgeon: Elam Dutch, MD;  Location: [place];  Service: Vascular;  Laterality: N/A;  . IR PARACENTESIS  08/30/2017  . IR PARACENTESIS  09/29/2017  . IR PARACENTESIS  10/28/2017  . IR PARACENTESIS  11/09/2017  . IR PARACENTESIS  11/16/2017  . IR PARACENTESIS  11/28/2017  . IR PARACENTESIS  12/01/2017  . IR PARACENTESIS  12/06/2017  . IR PARACENTESIS  01/03/2018  . IR PARACENTESIS  01/23/2018  . IR PARACENTESIS  02/07/2018  . IR PARACENTESIS  02/21/2018  . IR PARACENTESIS  03/06/2018  . IR PARACENTESIS  03/17/2018  . IR PARACENTESIS  04/04/2018  . IR PARACENTESIS  12/28/2018  . IR PARACENTESIS  01/08/2019  . IR PARACENTESIS  01/23/2019  . IR PARACENTESIS  02/01/2019  . IR  PARACENTESIS  02/19/2019  . IR PARACENTESIS  03/01/2019  . IR PARACENTESIS  03/15/2019  . IR PARACENTESIS  04/03/2019  . IR PARACENTESIS  04/12/2019  . IR PARACENTESIS  05/01/2019  . IR PARACENTESIS  05/08/2019  . IR PARACENTESIS  05/24/2019  . IR PARACENTESIS  06/12/2019  . IR PARACENTESIS  07/09/2019  . IR PARACENTESIS  07/27/2019  . IR PARACENTESIS  08/09/2019  . IR PARACENTESIS  08/21/2019  . IR PARACENTESIS  09/17/2019  . IR PARACENTESIS  10/05/2019  . IR PARACENTESIS  10/29/2019  . IR PARACENTESIS  11/08/2019  . IR PARACENTESIS  12/12/2019  . IR PARACENTESIS  01/03/2020  . IR PARACENTESIS  01/10/2020  . IR PARACENTESIS  01/17/2020  . IR PARACENTESIS  01/24/2020  . IR PARACENTESIS  01/31/2020  . IR PARACENTESIS  02/07/2020  . IR PARACENTESIS  02/21/2020  . IR PARACENTESIS  05/21/2020  . IR RADIOLOGIST EVAL & MGMT  02/14/2018  . IR RADIOLOGIST EVAL & MGMT  02/22/2019  . LEFT HEART  CATH AND CORONARY ANGIOGRAPHY N/A 02/22/2017   Procedure: LEFT HEART CATH AND CORONARY ANGIOGRAPHY;  Surgeon: Nigel Mormon, MD;  Location: [place] [facility];  Service: Cardiovascular;  Laterality: N/A;  . LIGATION OF ARTERIOVENOUS  FISTULA Left 01/15/5464   Procedure: Plication of Left Arm Arteriovenous Fistula;  Surgeon: Elam Dutch, MD;  Location: [place];  Service: Vascular;  Laterality: Left;  . POLYPECTOMY    . POLYPECTOMY  01/25/2018   Procedure: POLYPECTOMY;  Surgeon: Jerene Bears, MD;  Location: [place];  Service: Gastroenterology;;  . REVISON OF ARTERIOVENOUS FISTULA Left 0/35/4656   Procedure: PLICATION OF DISTAL ANEURYSMAL SEGEMENT OF LEFT UPPER ARM ARTERIOVENOUS FISTULA;  Surgeon: Elam Dutch, MD;  Location: [place];  Service: Vascular;  Laterality: Left;  . REVISON OF ARTERIOVENOUS FISTULA Left 02/21/7516   Procedure: Plication of Left Upper Arm Fistula ;  Surgeon: Waynetta Sandy, MD;  Location: [place];  Service: Vascular;  Laterality: Left;  . SKIN GRAFT SPLIT THICKNESS LEG / FOOT Left     SKIN GRAFT SPLIT THICKNESS LEFT ARM DONOR SITE: LEFT ANTERIOR THIGH  . SKIN SPLIT GRAFT Left 07/04/2017   Procedure: SKIN GRAFT SPLIT THICKNESS LEFT ARM DONOR SITE: LEFT ANTERIOR THIGH;  Surgeon: Elam Dutch, MD;  Location: [place];  Service: Vascular;  Laterality: Left;  . THROMBECTOMY W/ EMBOLECTOMY Left 06/05/2017   Procedure: EXPLORATION OF LEFT ARM FOR BLEEDING; OVERSEWED PROXIMAL FISTULA;  Surgeon: Angelia Mould, MD;  Location: [place];  Service: Vascular;  Laterality: Left;  . WOUND EXPLORATION Left 06/03/2017   Procedure: WOUND EXPLORATION WITH WOUND VAC APPLICATION TO LEFT ARM;  Surgeon: Angelia Mould, MD;  Location: [facility] OR;  Service: Vascular;  Laterality: Left;    Family History  Problem Relation Age of Onset  . Heart disease Mother   . Lung cancer Mother   . Heart disease Father   . Malignant hyperthermia Father   . COPD Father   . Throat cancer Sister   . Esophageal cancer Sister   . Hypertension Other   . COPD Other   . Colon cancer Neg Hx   . Colon polyps Neg Hx   . Rectal cancer Neg Hx   . Stomach cancer Neg Hx     Allergies  Allergen Reactions  . Tramadol Itching and Other (See Comments)  . Grass Extracts [Gramineae Pollens]     Other reaction(s): Sneezing  . Morphine And Related Other (See Comments)    Stomach pain  . Pollen Extract Other (See Comments)    Other reaction(s): Sneezing (finding)  . Acetaminophen Nausea Only    Stomach ache   . Aspirin Itching and Other (See Comments)    STOMACH PAIN   . Clonidine Derivatives Itching    Outpatient Medications Prior to Visit  Medication Sig Dispense Refill  . albuterol (VENTOLIN HFA) 108 (90 Base) MCG/ACT inhaler Inhale 2 puffs into the lungs every 4 (four) hours as needed for wheezing or shortness of breath.     Marland Kitchen amiodarone (PACERONE) 200 MG tablet Take 1 tablet (200 mg total) by mouth daily. 30 tablet 1  . diltiazem (CARDIZEM CD) 120 MG 24 hr capsule TAKE 1 CAPSULE BY MOUTH EVERY  DAY 90 capsule 0  . diphenhydrAMINE (BENADRYL) 25 mg capsule Take 1 capsule (25 mg total) by mouth every 6 (six) hours as needed for itching or allergies. 30 capsule 0  . metoprolol succinate (TOPROL XL) 25 MG 24 hr tablet Take 1 tablet (25 mg  total) by mouth daily. 30 tablet 2  . Oxycodone HCl 10 MG TABS Take 10 mg by mouth 3 (three) times daily as needed (pain).     . pantoprazole (PROTONIX) 40 MG tablet Take 1 tablet (40 mg total) by mouth 2 (two) times daily. 60 tablet 0  . sevelamer carbonate (RENVELA) 800 MG tablet Take 2,400 mg by mouth See admin instructions. Take 3 tablets (2400 mg) by mouth up to three times daily with meals    . tenofovir (VIREAD) 300 MG tablet Take 1 by mouth every Monday. 12 tablet 0  . gabapentin (NEURONTIN) 300 MG capsule Take 300 mg by mouth in the morning and at bedtime.    . ondansetron (ZOFRAN) 4 MG tablet TAKE 1 TABLET BY MOUTH EVERY 8 HOURS AS NEEDED FOR NAUSEA AND VOMITING 30 tablet 3  . cefdinir (OMNICEF) 300 MG capsule Take 1 capsule (300 mg total) by mouth every Monday, Wednesday, and Friday at 6 PM. (Patient not taking: Reported on 06/26/2020) 24 capsule 0   No facility-administered medications prior to visit.     ROS Review of Systems  Constitutional: Negative for activity change and appetite change.  HENT: Negative for sinus pressure and sore throat.   Eyes: Negative for visual disturbance.  Respiratory: Negative for cough, chest tightness and shortness of breath.   Cardiovascular: Negative for chest pain and leg swelling.  Gastrointestinal: Negative for abdominal distention, abdominal pain, constipation and diarrhea.  Endocrine: Negative.   Genitourinary: Negative for dysuria.  Musculoskeletal: Negative for joint swelling and myalgias.  Skin: Negative for rash.  Allergic/Immunologic: Negative.   Neurological: Positive for numbness. Negative for weakness and light-headedness.  Psychiatric/Behavioral: Negative for dysphoric mood and suicidal  ideas.    Objective:  BP 113/62   Pulse 68   Ht 5\' 9"  (1.753 m)   Wt 158 lb (71.7 kg)   SpO2 95%   BMI 23.33 kg/m   BP/Weight 06/26/2020 06/16/2020 35/00/9381  Systolic BP 829 937 169  Diastolic BP 62 67 70  Wt. (Lbs) 158 - -  BMI 23.33 - -  Some encounter information is confidential and restricted. Go to Review Flowsheets activity to see all data.      Physical Exam Constitutional:      Appearance: He is well-developed.  Neck:     Vascular: No JVD.  Cardiovascular:     Rate and Rhythm: Normal rate.     Heart sounds: Normal heart sounds. No murmur heard.   Pulmonary:     Effort: Pulmonary effort is normal.     Breath sounds: Normal breath sounds. No wheezing or rales.  Chest:     Chest wall: No tenderness.  Abdominal:     General: Bowel sounds are normal. There is no distension.     Palpations: Abdomen is soft. There is no mass.     Tenderness: There is no abdominal tenderness.     Comments: Hepatomegaly  Musculoskeletal:        General: Normal range of motion.     Right lower leg: No edema.     Left lower leg: No edema.  Neurological:     Mental Status: He is alert and oriented to person, place, and time.  Psychiatric:        Mood and Affect: Mood normal.     CMP Latest Ref Rng & Units 06/16/2020 05/22/2020 05/21/2020  Glucose 70 - 99 mg/dL 94 106(H) 102(H)  BUN 6 - 20 mg/dL 18 24(H) 15  Creatinine 0.61 -  1.24 mg/dL 5.23(H) 5.98(H) 4.31(H)  Sodium 135 - 145 mmol/L 134(L) 135 136  Potassium 3.5 - 5.1 mmol/L 4.9 4.3 3.8  Chloride 98 - 111 mmol/L 90(L) 96(L) 95(L)  CO2 22 - 32 mmol/L 28 27 28   Calcium 8.9 - 10.3 mg/dL 9.7 9.2 9.7  Total Protein 6.5 - 8.1 g/dL - - -  Total Bilirubin 0.3 - 1.2 mg/dL - - -  Alkaline Phos 38 - 126 U/L - - -  AST 15 - 41 U/L - - -  ALT 0 - 44 U/L - - -    Lipid Panel     Component Value Date/Time   CHOL 140 02/18/2020 0001   TRIG 107 02/18/2020 0001   HDL 38 (L) 02/18/2020 0001   CHOLHDL 3.7 02/18/2020 0001   VLDL 21  02/18/2020 0001   LDLCALC 81 02/18/2020 0001    CBC    Component Value Date/Time   WBC 7.9 06/16/2020 2323   RBC 3.63 (L) 06/16/2020 2323   HGB 10.3 (L) 06/16/2020 2323   HCT 30.1 (L) 06/16/2020 2323   PLT 127 (L) 06/16/2020 2323   MCV 82.9 06/16/2020 2323   MCH 28.4 06/16/2020 2323   MCHC 34.2 06/16/2020 2323   RDW 17.7 (H) 06/16/2020 2323   LYMPHSABS 1.5 05/20/2020 0914   MONOABS 0.7 05/20/2020 0914   EOSABS 0.2 05/20/2020 0914   BASOSABS 0.1 05/20/2020 0914    Lab Results  Component Value Date   HGBA1C 4.8 01/26/2020    Assessment & Plan:  1. Cirrhosis of liver with ascites, unspecified hepatic cirrhosis type ([place]) No indication for paracentesis at this time He declines my placing standing orders for him but rather prefers to call Radiology as needed to schedule  2. AF (paroxysmal atrial fibrillation) (H[facility]) Continue rate control with Metoprolol, Amiodarone  3. Gastroesophageal reflux disease without esophagitis Stable - ondansetron (ZOFRAN) 4 MG tablet; TAKE 1 TABLET BY MOUTH EVERY 8 HOURS AS NEEDED FOR NAUSEA AND VOMITING  Dispense: 30 tablet; Refill: 3  4. Chronic nausea Stable - ondansetron (ZOFRAN) 4 MG tablet; TAKE 1 TABLET BY MOUTH EVERY 8 HOURS AS NEEDED FOR NAUSEA AND VOMITING  Dispense: 30 tablet; Refill: 3  5. Neuropathic pain Controlled - gabapentin (NEURONTIN) 300 MG capsule; Take 1 capsule (300 mg total) by mouth in the morning and at bedtime.  Dispense: 60 capsule; Refill: 3  6. ESRD on dialysis [facility]) Continue hemodialysis as per schedule   Meds ordered this encounter  Medications  . gabapentin (NEURONTIN) 300 MG capsule    Sig: Take 1 capsule (300 mg total) by mouth in the morning and at bedtime.    Dispense:  60 capsule    Refill:  3  . ondansetron (ZOFRAN) 4 MG tablet    Sig: TAKE 1 TABLET BY MOUTH EVERY 8 HOURS AS NEEDED FOR NAUSEA AND VOMITING    Dispense:  30 tablet    Refill:  3    DX Code Needed  .R11.0    Follow-up: Return in  about 3 months (around 09/24/2020) for medical conditions.       Charlott Rakes, MD, FAAFP. [facility] and Hope Ralston, [place]   06/26/2020, 5:23 PM

## 2020-06-26 NOTE — Patient Instructions (Signed)
Neuropathic Pain Neuropathic pain is pain caused by damage to the nerves that are responsible for certain sensations in your body (sensory nerves). The pain can be caused by:  Damage to the sensory nerves that send signals to your spinal cord and brain (peripheral nervous system).  Damage to the sensory nerves in your brain or spinal cord (central nervous system). Neuropathic pain can make you more sensitive to pain. Even a minor sensation can feel very painful. This is usually a long-term condition that can be difficult to treat. The type of pain differs from person to person. It may:  Start suddenly (acute), or it may develop slowly and last for a long time (chronic).  Come and go as damaged nerves heal, or it may stay at the same level for years.  Cause emotional distress, loss of sleep, and a lower quality of life. What are the causes? The most common cause of this condition is diabetes. Many other diseases and conditions can also cause neuropathic pain. Causes of neuropathic pain can be classified as:  Toxic. This is caused by medicines and chemicals. The most common cause of toxic neuropathic pain is damage from cancer treatments (chemotherapy).  Metabolic. This can be caused by: ? Diabetes. This is the most common disease that damages the nerves. ? Lack of vitamin B from long-term alcohol abuse.  Traumatic. Any injury that cuts, crushes, or stretches a nerve can cause damage and pain. A common example is feeling pain after losing an arm or leg (phantom limb pain).  Compression-related. If a sensory nerve gets trapped or compressed for a long period of time, the blood supply to the nerve can be cut off.  Vascular. Many blood vessel diseases can cause neuropathic pain by decreasing blood supply and oxygen to nerves.  Autoimmune. This type of pain results from diseases in which the body's defense system (immune system) mistakenly attacks sensory nerves. Examples of autoimmune diseases  that can cause neuropathic pain include lupus and multiple sclerosis.  Infectious. Many types of viral infections can damage sensory nerves and cause pain. Shingles infection is a common cause of this type of pain.  Inherited. Neuropathic pain can be a symptom of many diseases that are passed down through families (genetic). What increases the risk? You are more likely to develop this condition if:  You have diabetes.  You smoke.  You drink too much alcohol.  You are taking certain medicines, including medicines that kill cancer cells (chemotherapy) or that treat immune system disorders. What are the signs or symptoms? The main symptom is pain. Neuropathic pain is often described as:  Burning.  Shock-like.  Stinging.  Hot or cold.  Itching. How is this diagnosed? No single test can diagnose neuropathic pain. It is diagnosed based on:  Physical exam and your symptoms. Your health care provider will ask you about your pain. You may be asked to use a pain scale to describe how bad your pain is.  Tests. These may be done to see if you have a high sensitivity to pain and to help find the cause and location of any sensory nerve damage. They include: ? Nerve conduction studies to test how well nerve signals travel through your sensory nerves (electrodiagnostic testing). ? Stimulating your sensory nerves through electrodes on your skin and measuring the response in your spinal cord and brain (somatosensory evoked potential).  Imaging studies, such as: ? X-rays. ? CT scan. ? MRI. How is this treated? Treatment for neuropathic pain may change   over time. You may need to try different treatment options or a combination of treatments. Some options include:  Treating the underlying cause of the neuropathy, such as diabetes, kidney disease, or vitamin deficiencies.  Stopping medicines that can cause neuropathy, such as chemotherapy.  Medicine to relieve pain. Medicines may  include: ? Prescription or over-the-counter pain medicine. ? Anti-seizure medicine. ? Antidepressant medicines. ? Pain-relieving patches that are applied to painful areas of skin. ? A medicine to numb the area (local anesthetic), which can be injected as a nerve block.  Transcutaneous nerve stimulation. This uses electrical currents to block painful nerve signals. The treatment is painless.  Alternative treatments, such as: ? Acupuncture. ? Meditation. ? Massage. ? Physical therapy. ? Pain management programs. ? Counseling. Follow these instructions at home: Medicines   Take over-the-counter and prescription medicines only as told by your health care provider.  Do not drive or use heavy machinery while taking prescription pain medicine.  If you are taking prescription pain medicine, take actions to prevent or treat constipation. Your health care provider may recommend that you: ? Drink enough fluid to keep your urine pale yellow. ? Eat foods that are high in fiber, such as fresh fruits and vegetables, whole grains, and beans. ? Limit foods that are high in fat and processed sugars, such as fried or sweet foods. ? Take an over-the-counter or prescription medicine for constipation. Lifestyle   Have a good support system at home.  Consider joining a chronic pain support group.  Do not use any products that contain nicotine or tobacco, such as cigarettes and e-cigarettes. If you need help quitting, ask your health care provider.  Do not drink alcohol. General instructions  Learn as much as you can about your condition.  Work closely with all your health care providers to find the treatment plan that works best for you.  Ask your health care provider what activities are safe for you.  Keep all follow-up visits as told by your health care provider. This is important. Contact a health care provider if:  Your pain treatments are not working.  You are having side effects  from your medicines.  You are struggling with tiredness (fatigue), mood changes, depression, or anxiety. Summary  Neuropathic pain is pain caused by damage to the nerves that are responsible for certain sensations in your body (sensory nerves).  Neuropathic pain may come and go as damaged nerves heal, or it may stay at the same level for years.  Neuropathic pain is usually a long-term condition that can be difficult to treat. Consider joining a chronic pain support group. This information is not intended to replace advice given to you by your health care provider. Make sure you discuss any questions you have with your health care provider. Document Revised: 10/19/2018 Document Reviewed: 07/15/2017 Elsevier Patient Education  2020 [employer]  

## 2020-06-28 IMAGING — US IR PARACENTESIS
2 series · 2 of 2 positions shown · non-contrast
Comparison: none

INDICATION: History of alcoholic cirrhosis with recurrent ascites. Request for
diagnostic and therapeutic paracentesis.

[Series 1: ir (id) (id)/(id)/(id) ir · 1 of 1 slices shown (1 of 2)]
[im 1/1]
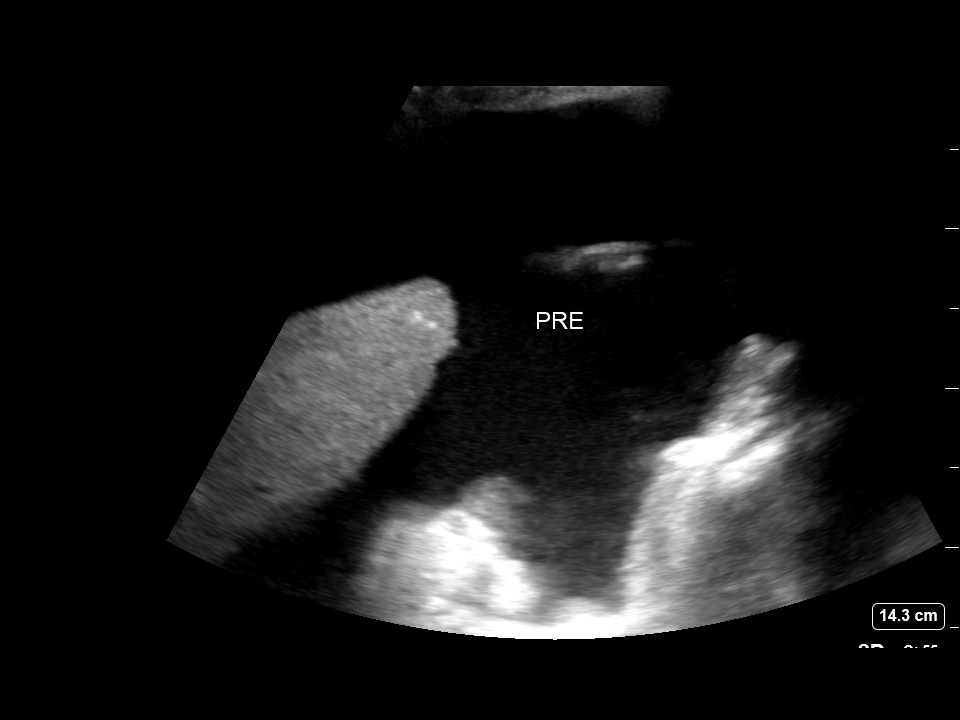

[Series 1: ir (id) (id)/(id)/(id) ir · 1 of 1 slices shown (2 of 2)]
[im 1/1]
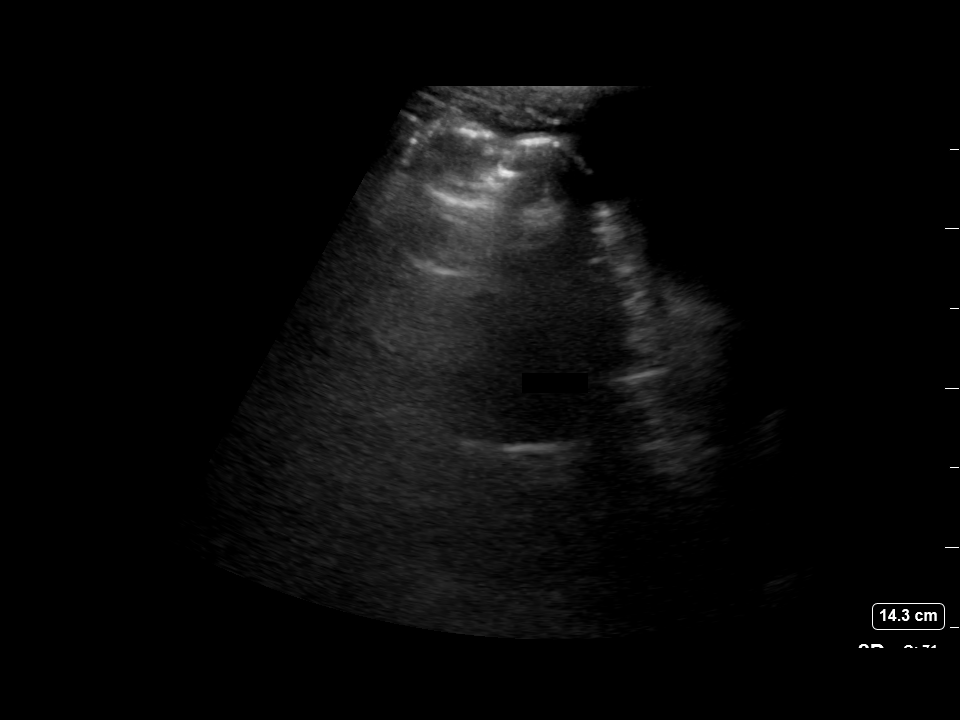

[2 of 2 positions shown; findings below may reference images not displayed]

EXAM:
ULTRASOUND GUIDED RIGHT LOWER QUADRANT PARACENTESIS

MEDICATIONS:
None.

COMPLICATIONS:
None immediate.

PROCEDURE:
Informed written consent was obtained from the patient after a
discussion of the risks, benefits and alternatives to treatment. A
timeout was performed prior to the initiation of the procedure.

Initial ultrasound scanning demonstrates a large amount of ascites
within the right lower abdominal quadrant. The right lower abdomen
was prepped and draped in the usual sterile fashion. 1% lidocaine
with epinephrine was used for local anesthesia.

Following this, a 19 gauge, 7-cm, Yueh catheter was introduced. An
ultrasound image was saved for documentation purposes. The
paracentesis was performed. The catheter was removed and a dressing
was applied. The patient tolerated the procedure well without
immediate post procedural complication.
FINDINGS: A total of approximately 3.4 L of clear, amber colored fluid was
removed. Samples were sent to the laboratory as requested by the
clinical team.
IMPRESSION: Successful ultrasound-guided paracentesis yielding 3.4 liters of
peritoneal fluid.

## 2020-06-30 ENCOUNTER — Inpatient Hospital Stay (HOSPITAL_COMMUNITY)
Admission: EM | Admit: 2020-06-30 | Discharge: 2020-07-01 | DRG: 640 | Discharge status: Against Medical Advice | Payer: Medicare Other | Attending: Family Medicine | Admitting: Family Medicine

## 2020-06-30 ENCOUNTER — Other Ambulatory Visit: Payer: Self-pay

## 2020-06-30 ENCOUNTER — Encounter (HOSPITAL_COMMUNITY): Payer: Self-pay | Admitting: Family Medicine

## 2020-06-30 ENCOUNTER — Emergency Department (HOSPITAL_COMMUNITY): Payer: Medicare Other

## 2020-06-30 DIAGNOSIS — I1311 Hypertensive heart and chronic kidney disease without heart failure, with stage 5 chronic kidney disease, or end stage renal disease: Secondary | ICD-10-CM | POA: Diagnosis present

## 2020-06-30 DIAGNOSIS — K7460 Unspecified cirrhosis of liver: Secondary | ICD-10-CM | POA: Diagnosis present

## 2020-06-30 DIAGNOSIS — E875 Hyperkalemia: Secondary | ICD-10-CM | POA: Diagnosis present

## 2020-06-30 DIAGNOSIS — F419 Anxiety disorder, unspecified: Secondary | ICD-10-CM | POA: Diagnosis present

## 2020-06-30 DIAGNOSIS — Z889 Allergy status to unspecified drugs, medicaments and biological substances status: Secondary | ICD-10-CM

## 2020-06-30 DIAGNOSIS — I480 Paroxysmal atrial fibrillation: Secondary | ICD-10-CM | POA: Diagnosis present

## 2020-06-30 DIAGNOSIS — J449 Chronic obstructive pulmonary disease, unspecified: Secondary | ICD-10-CM | POA: Diagnosis present

## 2020-06-30 DIAGNOSIS — N186 End stage renal disease: Secondary | ICD-10-CM | POA: Diagnosis present

## 2020-06-30 DIAGNOSIS — I252 Old myocardial infarction: Secondary | ICD-10-CM | POA: Diagnosis not present

## 2020-06-30 DIAGNOSIS — Z886 Allergy status to analgesic agent status: Secondary | ICD-10-CM

## 2020-06-30 DIAGNOSIS — Z8619 Personal history of other infectious and parasitic diseases: Secondary | ICD-10-CM

## 2020-06-30 DIAGNOSIS — Z20822 Contact with and (suspected) exposure to covid-19: Secondary | ICD-10-CM | POA: Diagnosis present

## 2020-06-30 DIAGNOSIS — I050 Rheumatic mitral stenosis: Secondary | ICD-10-CM | POA: Diagnosis present

## 2020-06-30 DIAGNOSIS — M19012 Primary osteoarthritis, left shoulder: Secondary | ICD-10-CM | POA: Diagnosis present

## 2020-06-30 DIAGNOSIS — G8929 Other chronic pain: Secondary | ICD-10-CM | POA: Diagnosis present

## 2020-06-30 DIAGNOSIS — B181 Chronic viral hepatitis B without delta-agent: Secondary | ICD-10-CM | POA: Diagnosis present

## 2020-06-30 DIAGNOSIS — F17210 Nicotine dependence, cigarettes, uncomplicated: Secondary | ICD-10-CM | POA: Diagnosis present

## 2020-06-30 DIAGNOSIS — J9811 Atelectasis: Secondary | ICD-10-CM | POA: Diagnosis present

## 2020-06-30 DIAGNOSIS — R778 Other specified abnormalities of plasma proteins: Secondary | ICD-10-CM | POA: Diagnosis present

## 2020-06-30 DIAGNOSIS — K219 Gastro-esophageal reflux disease without esophagitis: Secondary | ICD-10-CM | POA: Diagnosis present

## 2020-06-30 DIAGNOSIS — Z87442 Personal history of urinary calculi: Secondary | ICD-10-CM

## 2020-06-30 DIAGNOSIS — I471 Supraventricular tachycardia: Secondary | ICD-10-CM | POA: Diagnosis present

## 2020-06-30 DIAGNOSIS — E8889 Other specified metabolic disorders: Secondary | ICD-10-CM | POA: Diagnosis present

## 2020-06-30 DIAGNOSIS — Z720 Tobacco use: Secondary | ICD-10-CM | POA: Diagnosis present

## 2020-06-30 DIAGNOSIS — Z825 Family history of asthma and other chronic lower respiratory diseases: Secondary | ICD-10-CM

## 2020-06-30 DIAGNOSIS — Z888 Allergy status to other drugs, medicaments and biological substances status: Secondary | ICD-10-CM

## 2020-06-30 DIAGNOSIS — Z992 Dependence on renal dialysis: Secondary | ICD-10-CM

## 2020-06-30 DIAGNOSIS — D631 Anemia in chronic kidney disease: Secondary | ICD-10-CM | POA: Diagnosis present

## 2020-06-30 DIAGNOSIS — I700 Atherosclerosis of aorta: Secondary | ICD-10-CM | POA: Diagnosis present

## 2020-06-30 DIAGNOSIS — I10 Essential (primary) hypertension: Secondary | ICD-10-CM | POA: Diagnosis present

## 2020-06-30 DIAGNOSIS — I2510 Atherosclerotic heart disease of native coronary artery without angina pectoris: Secondary | ICD-10-CM | POA: Diagnosis present

## 2020-06-30 DIAGNOSIS — Z8249 Family history of ischemic heart disease and other diseases of the circulatory system: Secondary | ICD-10-CM

## 2020-06-30 DIAGNOSIS — Z885 Allergy status to narcotic agent status: Secondary | ICD-10-CM

## 2020-06-30 DIAGNOSIS — R0789 Other chest pain: Secondary | ICD-10-CM | POA: Diagnosis present

## 2020-06-30 DIAGNOSIS — Z9115 Patient's noncompliance with renal dialysis: Secondary | ICD-10-CM

## 2020-06-30 DIAGNOSIS — N2581 Secondary hyperparathyroidism of renal origin: Secondary | ICD-10-CM | POA: Diagnosis present

## 2020-06-30 DIAGNOSIS — Z79899 Other long term (current) drug therapy: Secondary | ICD-10-CM

## 2020-06-30 DIAGNOSIS — R079 Chest pain, unspecified: Secondary | ICD-10-CM | POA: Diagnosis present

## 2020-06-30 DIAGNOSIS — R9431 Abnormal electrocardiogram [ECG] [EKG]: Secondary | ICD-10-CM | POA: Diagnosis present

## 2020-06-30 DIAGNOSIS — I4891 Unspecified atrial fibrillation: Secondary | ICD-10-CM | POA: Diagnosis not present

## 2020-06-30 HISTORY — DX: Cardiac arrhythmia, unspecified: I49.9

## 2020-06-30 LAB — BASIC METABOLIC PANEL
Anion gap: 17 — ABNORMAL HIGH (ref 5–15)
BUN: 57 mg/dL — ABNORMAL HIGH (ref 6–20)
CO2: 24 mmol/L (ref 22–32)
Calcium: 10.2 mg/dL (ref 8.9–10.3)
Chloride: 89 mmol/L — ABNORMAL LOW (ref 98–111)
Creatinine, Ser: 9.76 mg/dL — ABNORMAL HIGH (ref 0.61–1.24)
GFR, Estimated: 6 mL/min — ABNORMAL LOW (ref >60)
Glucose, Bld: 85 mg/dL (ref 70–99)
Potassium: 7.4 mmol/L (ref 3.5–5.1)
Sodium: 130 mmol/L — ABNORMAL LOW (ref 135–145)

## 2020-06-30 LAB — CBC
HCT: 34.3 % — ABNORMAL LOW (ref 39.0–52.0)
Hemoglobin: 11.6 g/dL — ABNORMAL LOW (ref 13.0–17.0)
MCH: 28.2 pg (ref 26.0–34.0)
MCHC: 33.8 g/dL (ref 30.0–36.0)
MCV: 83.5 fL (ref 80.0–100.0)
Platelets: 170 10*3/uL (ref 150–400)
RBC: 4.11 MIL/uL — ABNORMAL LOW (ref 4.22–5.81)
RDW: 17 % — ABNORMAL HIGH (ref 11.5–15.5)
WBC: 12.1 10*3/uL — ABNORMAL HIGH (ref 4.0–10.5)
nRBC: 0 % (ref 0.0–0.2)

## 2020-06-30 LAB — RESP PANEL BY RT-PCR (FLU A&B, COVID) ARPGX2
Influenza A by PCR: NEGATIVE
Influenza B by PCR: NEGATIVE
SARS Coronavirus 2 by RT PCR: NEGATIVE

## 2020-06-30 LAB — CBG MONITORING, ED: Glucose-Capillary: 83 mg/dL (ref 70–99)

## 2020-06-30 LAB — TROPONIN I (HIGH SENSITIVITY): Troponin I (High Sensitivity): 37 ng/L — ABNORMAL HIGH (ref <18)

## 2020-06-30 MED ORDER — INSULIN ASPART 100 UNIT/ML IV SOLN
10.0000 [IU] | Freq: Once | INTRAVENOUS | Status: AC
  Administered 2020-06-30: 10 [IU] via INTRAVENOUS

## 2020-06-30 MED ORDER — ALBUTEROL SULFATE HFA 108 (90 BASE) MCG/ACT IN AERS: 2.0000 | INHALATION_SPRAY | RESPIRATORY_TRACT | Status: DC | PRN

## 2020-06-30 MED ORDER — SODIUM ZIRCONIUM CYCLOSILICATE 10 G PO PACK: 10.0000 g | PACK | Freq: Once | ORAL | Status: DC

## 2020-06-30 MED ORDER — DILTIAZEM HCL-DEXTROSE 125-5 MG/125ML-% IV SOLN (PREMIX)
5.0000 mg/h | INTRAVENOUS | Status: DC
  Administered 2020-06-30: 5 mg/h via INTRAVENOUS
  Filled 2020-06-30: qty 125

## 2020-06-30 MED ORDER — DILTIAZEM LOAD VIA INFUSION
10.0000 mg | Freq: Once | INTRAVENOUS | Status: AC
  Administered 2020-06-30: 10 mg via INTRAVENOUS
  Filled 2020-06-30: qty 10

## 2020-06-30 MED ORDER — CINACALCET HCL 30 MG PO TABS: 30.0000 mg | ORAL_TABLET | ORAL | Status: DC

## 2020-06-30 MED ORDER — METOPROLOL SUCCINATE ER 25 MG PO TB24: 25.0000 mg | ORAL_TABLET | Freq: Every day | ORAL | Status: DC

## 2020-06-30 MED ORDER — GABAPENTIN 300 MG PO CAPS
300.0000 mg | ORAL_CAPSULE | Freq: Two times a day (BID) | ORAL | Status: DC
  Administered 2020-06-30: 300 mg via ORAL
  Filled 2020-06-30: qty 1

## 2020-06-30 MED ORDER — ASPIRIN 81 MG PO CHEW
324.0000 mg | CHEWABLE_TABLET | Freq: Once | ORAL | Status: AC
  Administered 2020-06-30: 324 mg via ORAL
  Filled 2020-06-30: qty 4

## 2020-06-30 MED ORDER — HYDROMORPHONE HCL 1 MG/ML IJ SOLN
1.0000 mg | INTRAMUSCULAR | Status: DC | PRN
  Administered 2020-06-30: 1 mg via INTRAVENOUS
  Filled 2020-06-30: qty 1

## 2020-06-30 MED ORDER — OXYCODONE HCL 5 MG PO TABS: 10.0000 mg | ORAL_TABLET | Freq: Three times a day (TID) | ORAL | Status: DC | PRN

## 2020-06-30 MED ORDER — CHLORHEXIDINE GLUCONATE CLOTH 2 % EX PADS: 6.0000 | MEDICATED_PAD | Freq: Every day | CUTANEOUS | Status: DC

## 2020-06-30 MED ORDER — DEXTROSE 50 % IV SOLN
1.0000 | Freq: Once | INTRAVENOUS | Status: AC
  Administered 2020-06-30: 50 mL via INTRAVENOUS
  Filled 2020-06-30: qty 50

## 2020-06-30 MED ORDER — SODIUM CHLORIDE 0.9 % IV SOLN
1.0000 g | Freq: Once | INTRAVENOUS | Status: AC
  Administered 2020-06-30: 1 g via INTRAVENOUS
  Filled 2020-06-30: qty 10

## 2020-06-30 MED ORDER — SEVELAMER CARBONATE 800 MG PO TABS: 2400.0000 mg | ORAL_TABLET | Freq: Three times a day (TID) | ORAL | Status: DC

## 2020-06-30 MED ORDER — APIXABAN 2.5 MG PO TABS
2.5000 mg | ORAL_TABLET | Freq: Two times a day (BID) | ORAL | Status: DC
  Filled 2020-06-30: qty 1

## 2020-06-30 NOTE — Consult Note (Addendum)
[facility]  INPATIENT CONSULTATION  Reason for Consultation: hyperkalemia, ESRD on HD Requesting Provider: Dr. Jeanell Sparrow  HPI: [name] is an 59 y.o. male with ESRD on HD MWF at [facility], HTN, HCV with cirrhosis, COPD, PAF, chronic pain,medical noncompliance with truncated HD treatments, polysubstance abusewho is seen for evaluation and management of hyperkalemia in setting of missed HD.   Pt presented for dialysis today but had disagreement with PCT so had no treatment.  Developed chest pressure feeling like someone standing on chest.  Presented to ED - found to be in A fib with RVR requiring cardizem gtt.  K 7.4, confirmed on repeat 7.2 tx with insulin/dextrose.  Bicarb 24, BUN 57, Ca 10.2, WBC 12.1 Hb 11.6, Plt 170, COVID and flu neg. CXR with atelectasis.   He is being admitted for the SVT.  BP initially ok but after cardizem is now in the 90s. BPs at HD labile last tx pre SBP 110, post 190.   PMH: Past Medical History:  Diagnosis Date  . Anemia   . Anxiety   . Arthritis    left shoulder  . Atherosclerosis of aorta ([place])   . Cardiomegaly   . Chest pain    DATE UNKNOWN, C/O PERIODICALLY  . Cocaine abuse ([place])   . COPD exacerbation ([place]) 08/17/2016  . Coronary artery disease    stent 02/22/17  . ESRD (end stage renal disease) on dialysis ([place])    "[name]; MWF" (07/04/2017)  . GERD (gastroesophageal reflux disease)    DATE UNKNOWN  . Hemorrhoids   . Hepatitis B, chronic ([place])   . Hepatitis C   . History of kidney stones   . Hyperkalemia   . Hypertension   . Kidney failure   . Metabolic bone disease    Patient denies  . Mitral stenosis   . Myocardial infarction ([place])   . Pneumonia   . Pulmonary edema   . Solitary rectal ulcer syndrome 07/2017   at flex sig for rectal bleeding  . Tubular adenoma of colon    PSH:  Reviewed and noted in epic Past Surgical History:  Procedure Laterality Date  . A/V FISTULAGRAM Left 05/26/2017   Procedure: A/V  FISTULAGRAM;  Surgeon: Conrad Nielsville, MD;  Location: [place] [facility];  Service: Cardiovascular;  Laterality: Left;  . A/V FISTULAGRAM Right 11/18/2017   Procedure: A/V FISTULAGRAM - Right Arm;  Surgeon: Elam Dutch, MD;  Location: [place] [facility];  Service: Cardiovascular;  Laterality: Right;  . APPLICATION OF WOUND VAC Left 06/14/2017   Procedure: APPLICATION OF WOUND VAC;  Surgeon: Katha Cabal, MD;  Location: [facility];  Service: Vascular;  Laterality: Left;  . AV FISTULA PLACEMENT  2012   BELIEVED WAS PLACED IN JUNE  . AV FISTULA PLACEMENT Right 08/09/2017   Procedure: Creation Right arm ARTERIOVENOUS BRACHIOCEPOHALIC FISTULA;  Surgeon: Elam Dutch, MD;  Location: [facility] OR;  Service: Vascular;  Laterality: Right;  . AV FISTULA PLACEMENT Right 11/22/2017   Procedure: INSERTION OF ARTERIOVENOUS (AV) GORE-TEX GRAFT RIGHT UPPER ARM;  Surgeon: Elam Dutch, MD;  Location: [place];  Service: Vascular;  Laterality: Right;  . BIOPSY  01/25/2018   Procedure: BIOPSY;  Surgeon: Jerene Bears, MD;  Location: [place];  Service: Gastroenterology;;  . BIOPSY  04/10/2019   Procedure: BIOPSY;  Surgeon: Jerene Bears, MD;  Location: [facility];  Service: Gastroenterology;;  . COLONOSCOPY    . COLONOSCOPY WITH PROPOFOL N/A 01/25/2018  Procedure: COLONOSCOPY WITH PROPOFOL;  Surgeon: Jerene Bears, MD;  Location: [place];  Service: Gastroenterology;  Laterality: N/A;  . CORONARY STENT INTERVENTION N/A 02/22/2017   Procedure: CORONARY STENT INTERVENTION;  Surgeon: Nigel Mormon, MD;  Location: [place] [facility];  Service: Cardiovascular;  Laterality: N/A;  . ESOPHAGOGASTRODUODENOSCOPY (EGD) WITH PROPOFOL N/A 01/25/2018   Procedure: ESOPHAGOGASTRODUODENOSCOPY (EGD) WITH PROPOFOL;  Surgeon: Jerene Bears, MD;  Location: [place];  Service: Gastroenterology;  Laterality: N/A;  . ESOPHAGOGASTRODUODENOSCOPY (EGD) WITH PROPOFOL N/A 04/10/2019   Procedure:  ESOPHAGOGASTRODUODENOSCOPY (EGD) WITH PROPOFOL;  Surgeon: Jerene Bears, MD;  [facility]: [facility];  Service: Gastroenterology;  Laterality: N/A;  . FLEXIBLE SIGMOIDOSCOPY N/A 07/15/2017   Procedure: FLEXIBLE SIGMOIDOSCOPY;  Surgeon: Carol Ada, MD;  Location: [place];  Service: Endoscopy;  Laterality: N/A;  . HEMORRHOID BANDING    . I & D EXTREMITY Left 06/01/2017   Procedure: IRRIGATION AND DEBRIDEMENT LEFT ARM HEMATOMA WITH LIGATION OF LEFT ARM AV FISTULA;  Surgeon: Elam Dutch, MD;  Location: [place];  Service: Vascular;  Laterality: Left;  . I & D EXTREMITY Left 06/14/2017   Procedure: IRRIGATION AND DEBRIDEMENT EXTREMITY;  Surgeon: Katha Cabal, MD;  Location: [facility];  Service: Vascular;  Laterality: Left;  . INSERTION OF DIALYSIS CATHETER  05/30/2017  . INSERTION OF DIALYSIS CATHETER N/A 05/30/2017   Procedure: INSERTION OF DIALYSIS CATHETER;  Surgeon: Elam Dutch, MD;  Location: [place];  Service: Vascular;  Laterality: N/A;  . IR PARACENTESIS  08/30/2017  . IR PARACENTESIS  09/29/2017  . IR PARACENTESIS  10/28/2017  . IR PARACENTESIS  11/09/2017  . IR PARACENTESIS  11/16/2017  . IR PARACENTESIS  11/28/2017  . IR PARACENTESIS  12/01/2017  . IR PARACENTESIS  12/06/2017  . IR PARACENTESIS  01/03/2018  . IR PARACENTESIS  01/23/2018  . IR PARACENTESIS  02/07/2018  . IR PARACENTESIS  02/21/2018  . IR PARACENTESIS  03/06/2018  . IR PARACENTESIS  03/17/2018  . IR PARACENTESIS  04/04/2018  . IR PARACENTESIS  12/28/2018  . IR PARACENTESIS  01/08/2019  . IR PARACENTESIS  01/23/2019  . IR PARACENTESIS  02/01/2019  . IR PARACENTESIS  02/19/2019  . IR PARACENTESIS  03/01/2019  . IR PARACENTESIS  03/15/2019  . IR PARACENTESIS  04/03/2019  . IR PARACENTESIS  04/12/2019  . IR PARACENTESIS  05/01/2019  . IR PARACENTESIS  05/08/2019  . IR PARACENTESIS  05/24/2019  . IR PARACENTESIS  06/12/2019  . IR PARACENTESIS  07/09/2019  . IR PARACENTESIS  07/27/2019  . IR PARACENTESIS  08/09/2019  . IR  PARACENTESIS  08/21/2019  . IR PARACENTESIS  09/17/2019  . IR PARACENTESIS  10/05/2019  . IR PARACENTESIS  10/29/2019  . IR PARACENTESIS  11/08/2019  . IR PARACENTESIS  12/12/2019  . IR PARACENTESIS  01/03/2020  . IR PARACENTESIS  01/10/2020  . IR PARACENTESIS  01/17/2020  . IR PARACENTESIS  01/24/2020  . IR PARACENTESIS  01/31/2020  . IR PARACENTESIS  02/07/2020  . IR PARACENTESIS  02/21/2020  . IR PARACENTESIS  05/21/2020  . IR RADIOLOGIST EVAL & MGMT  02/14/2018  . IR RADIOLOGIST EVAL & MGMT  02/22/2019  . LEFT HEART CATH AND CORONARY ANGIOGRAPHY N/A 02/22/2017   Procedure: LEFT HEART CATH AND CORONARY ANGIOGRAPHY;  Surgeon: Nigel Mormon, MD;  Location: [place] [facility];  Service: Cardiovascular;  Laterality: N/A;  . LIGATION OF ARTERIOVENOUS  FISTULA Left 2/0/2542   Procedure: Plication of Left Arm Arteriovenous Fistula;  Surgeon: Elam Dutch, MD;  Location: [place];  Service: Vascular;  Laterality: Left;  . POLYPECTOMY    . POLYPECTOMY  01/25/2018   Procedure: POLYPECTOMY;  Surgeon: Jerene Bears, MD;  Location: [place];  Service: Gastroenterology;;  . REVISON OF ARTERIOVENOUS FISTULA Left 8/67/6195   Procedure: PLICATION OF DISTAL ANEURYSMAL SEGEMENT OF LEFT UPPER ARM ARTERIOVENOUS FISTULA;  Surgeon: Elam Dutch, MD;  Location: [place];  Service: Vascular;  Laterality: Left;  . REVISON OF ARTERIOVENOUS FISTULA Left 0/93/2671   Procedure: Plication of Left Upper Arm Fistula ;  Surgeon: Waynetta Sandy, MD;  Location: [place];  Service: Vascular;  Laterality: Left;  . SKIN GRAFT SPLIT THICKNESS LEG / FOOT Left    SKIN GRAFT SPLIT THICKNESS LEFT ARM DONOR SITE: LEFT ANTERIOR THIGH  . SKIN SPLIT GRAFT Left 07/04/2017   Procedure: SKIN GRAFT SPLIT THICKNESS LEFT ARM DONOR SITE: LEFT ANTERIOR THIGH;  Surgeon: Elam Dutch, MD;  Location: [place];  Service: Vascular;  Laterality: Left;  . THROMBECTOMY W/ EMBOLECTOMY Left 06/05/2017   Procedure: EXPLORATION OF LEFT ARM FOR  BLEEDING; OVERSEWED PROXIMAL FISTULA;  Surgeon: Angelia Mould, MD;  Location: [place];  Service: Vascular;  Laterality: Left;  . WOUND EXPLORATION Left 06/03/2017   Procedure: WOUND EXPLORATION WITH WOUND VAC APPLICATION TO LEFT ARM;  Surgeon: Angelia Mould, MD;  Location: [place];  Service: Vascular;  Laterality: Left;    Past Medical History:  Diagnosis Date  . Anemia   . Anxiety   . Arthritis    left shoulder  . Atherosclerosis of aorta ([place])   . Cardiomegaly   . Chest pain    DATE UNKNOWN, C/O PERIODICALLY  . Cocaine abuse ([place])   . COPD exacerbation ([place]) 08/17/2016  . Coronary artery disease    stent 02/22/17  . ESRD (end stage renal disease) on dialysis ([place])    "[name]; MWF" (07/04/2017)  . GERD (gastroesophageal reflux disease)    DATE UNKNOWN  . Hemorrhoids   . Hepatitis B, chronic ([place])   . Hepatitis C   . History of kidney stones   . Hyperkalemia   . Hypertension   . Kidney failure   . Metabolic bone disease    Patient denies  . Mitral stenosis   . Myocardial infarction ([place])   . Pneumonia   . Pulmonary edema   . Solitary rectal ulcer syndrome 07/2017   at flex sig for rectal bleeding  . Tubular adenoma of colon     Medications:  I have reviewed the patient's current medications.  (Not in a hospital admission)   ALLERGIES:   Allergies  Allergen Reactions  . Tramadol Itching and Other (See Comments)    Other reaction(s): Unknown  . Grass Extracts [Gramineae Pollens]     Other reaction(s): Sneezing  . Morphine And Related Other (See Comments)    Stomach pain  . Pollen Extract Other (See Comments)    Other reaction(s): Sneezing (finding)  . Acetaminophen Nausea Only    Stomach ache   . Aspirin Itching and Other (See Comments)    STOMACH PAIN  Other reaction(s): Unknown  . Clonidine Derivatives Itching    FAM HX: Family History  Problem Relation Age of Onset  . Heart disease Mother   . Lung cancer Mother   . Heart  disease Father   . Malignant hyperthermia Father   . COPD Father   . Throat cancer Sister   . Esophageal cancer Sister   .  Hypertension Other   . COPD Other   . Colon cancer Neg Hx   . Colon polyps Neg Hx   . Rectal cancer Neg Hx   . Stomach cancer Neg Hx     Social History:   reports that he has been smoking cigarettes. He started smoking about 46 years ago. He has a 21.50 pack-year smoking history. He has never used smokeless tobacco. He reports previous alcohol use. He reports previous drug use. Drugs: Marijuana and Cocaine.  ROS: 12 system ROS neg except per HPI  Blood pressure (!) 91/59, pulse 92, temperature 100.2 F (37.9 C), temperature source Temporal, resp. rate 18, SpO2 96 %. PHYSICAL EXAM: Gen: chronically ill appearing, lying at 30 deg in no distress on stretcher on stretcher  Eyes: anicteric, EOMI ENT: dry MM CV:  Tachy, irregular Abd: soft, mild TTP, mildly distended Lungs:dec BS R base, rales L base GU: no foley Extr:  Trace edema, RUE AVG +T/B Neuro: grossly nonfocal Skin:  Warm and dry   Results for orders placed or performed during the hospital encounter of 06/30/20 (from the past 48 hour(s))  CBG monitoring, ED     Status: None   Collection Time: 06/30/20  5:53 PM  Result Value Ref Range   Glucose-Capillary 83 70 - 99 mg/dL    Comment: Glucose reference range applies only to samples taken after fasting for at least 8 hours.   Comment 1 Notify RN    Comment 2 Document in Chart   Troponin I (High Sensitivity)     Status: Abnormal   Collection Time: 06/30/20  5:55 PM  Result Value Ref Range   Troponin I (High Sensitivity) 37 (H) <18 ng/L    Comment: (NOTE) Elevated high sensitivity troponin I (hsTnI) values and significant  changes across serial measurements may suggest ACS but many other  chronic and acute conditions are known to elevate hsTnI results.  Refer to the Links section for chest pain algorithms and additional  guidance. Performed at [facility], [place] [address], [place], [place] [address]   Basic metabolic panel     Status: Abnormal   Collection Time: 06/30/20  5:55 PM  Result Value Ref Range   Sodium 130 (L) 135 - 145 mmol/L   Potassium 7.4 (HH) 3.5 - 5.1 mmol/L    Comment: HEMOLYSIS AT THIS LEVEL MAY AFFECT RESULT CRITICAL RESULT CALLED TO, READ BACK BY AND VERIFIED WITH: Ritta Slot 1919 06/30/2020 WBOND    Chloride 89 (L) 98 - 111 mmol/L   CO2 24 22 - 32 mmol/L   Glucose, Bld 85 70 - 99 mg/dL    Comment: Glucose reference range applies only to samples taken after fasting for at least 8 hours.   BUN 57 (H) 6 - 20 mg/dL   Creatinine, Ser 9.76 (H) 0.61 - 1.24 mg/dL   Calcium 10.2 8.9 - 10.3 mg/dL   GFR, Estimated 6 (L) >60 mL/min    Comment: (NOTE) Calculated using the CKD-EPI Creatinine Equation (2021)    Anion gap 17 (H) 5 - 15    Comment: Performed at [facility] [address], [place] [address]  CBC     Status: Abnormal   Collection Time: 06/30/20  5:55 PM  Result Value Ref Range   WBC 12.1 (H) 4.0 - 10.5 K/uL   RBC 4.11 (L) 4.22 - 5.81 MIL/uL   Hemoglobin 11.6 (L) 13.0 - 17.0 g/dL   HCT 34.3 (L) 39.0 - 52.0 %  MCV 83.5 80.0 - 100.0 fL   MCH 28.2 26.0 - 34.0 pg   MCHC 33.8 30.0 - 36.0 g/dL   RDW 17.0 (H) 11.5 - 15.5 %   Platelets 170 150 - 400 K/uL   nRBC 0.0 0.0 - 0.2 %    Comment: Performed at [facility] [address], [place], [place] [address]  Resp Panel by RT-PCR (Flu A&B, Covid) Nasopharyngeal Swab     Status: None   Collection Time: 06/30/20  5:55 PM   Specimen: Nasopharyngeal Swab; Nasopharyngeal(NP) swabs in vial transport medium  Result Value Ref Range   SARS Coronavirus 2 by RT PCR NEGATIVE NEGATIVE    Comment: (NOTE) SARS-CoV-2 target nucleic acids are NOT DETECTED.  The SARS-CoV-2 RNA is generally detectable in upper respiratory specimens during the acute phase of infection. The lowest concentration of SARS-CoV-2 viral copies this assay can detect  is 138 copies/mL. A negative result does not preclude SARS-Cov-2 infection and should not be used as the sole basis for treatment or other patient management decisions. A negative result may occur with  improper specimen collection/handling, submission of specimen other than nasopharyngeal swab, presence of viral mutation(s) within the areas targeted by this assay, and inadequate number of viral copies(<138 copies/mL). A negative result must be combined with clinical observations, patient history, and epidemiological information. The expected result is Negative.  Fact Sheet for Patients:  EntrepreneurPulse.com.au  Fact Sheet for Healthcare Providers:  IncredibleEmployment.be  This test is no t yet approved or cleared by the [place] FDA and  has been authorized for detection and/or diagnosis of SARS-CoV-2 by FDA under an Emergency Use Authorization (EUA). This EUA will remain  in effect (meaning this test can be used) for the duration of the COVID-19 declaration under Section 564(b)(1) of the Act, 21 U.S.C.section 360bbb-3(b)(1), unless the authorization is terminated  or revoked sooner.       Influenza A by PCR NEGATIVE NEGATIVE   Influenza B by PCR NEGATIVE NEGATIVE    Comment: (NOTE) The Xpert Xpress SARS-CoV-2/FLU/RSV plus assay is intended as an aid in the diagnosis of influenza from Nasopharyngeal swab specimens and should not be used as a sole basis for treatment. Nasal washings and aspirates are unacceptable for Xpert Xpress SARS-CoV-2/FLU/RSV testing.  Fact Sheet for Patients: EntrepreneurPulse.com.au  Fact Sheet for Healthcare Providers: IncredibleEmployment.be  This test is not yet approved or cleared by the [place] FDA and has been authorized for detection and/or diagnosis of SARS-CoV-2 by FDA under an Emergency Use Authorization (EUA). This EUA will remain in effect (meaning this  test can be used) for the duration of the COVID-19 declaration under Section 564(b)(1) of the Act, 21 U.S.C. section 360bbb-3(b)(1), unless the authorization is terminated or revoked.  Performed at [facility], [place] [address] [place], [place] [address]   I-stat chem 8, ED (not at [facility] or [facility]     Status: Abnormal   Collection Time: 06/30/20  6:03 PM  Result Value Ref Range   Sodium 129 (L) 135 - 145 mmol/L   Potassium 7.2 (HH) 3.5 - 5.1 mmol/L   Chloride 95 (L) 98 - 111 mmol/L   BUN 67 (H) 6 - 20 mg/dL   Creatinine, Ser 9.60 (H) 0.61 - 1.24 mg/dL   Glucose, Bld 81 70 - 99 mg/dL    Comment: Glucose reference range applies only to samples taken after fasting for at least 8 hours.   Calcium, Ion 1.16 1.15 - 1.40 mmol/L  TCO2 28 22 - 32 mmol/L   Hemoglobin 12.2 (L) 13.0 - 17.0 g/dL   HCT 36.0 (L) 39.0 - 52.0 %   Comment NOTIFIED PHYSICIAN     DG Chest Port 1 View  Result Date: 06/30/2020 CLINICAL DATA:  59 year old male with shortness of breath and chest pressure. EXAM: PORTABLE CHEST 1 VIEW COMPARISON:  Chest radiograph dated 06/16/2020. FINDINGS: Mild eventration of the left hemidiaphragm. There are bibasilar densities, likely atelectasis. No focal consolidation, pleural effusion, pneumothorax. There is mild cardiomegaly. Atherosclerotic calcification of the aorta. No acute osseous pathology. IMPRESSION: 1. No acute cardiopulmonary process. 2. Mild cardiomegaly. Electronically Signed   By: Anner Crete M.D.   On: 06/30/2020 17:49   Dialysis orders:  [facility] MWF 180 NR 450/800, 4hrs, EDW 68kg, 2K/2Ca, AVG Last tx 12/17 - ran 3:17, post wt 70.1kg 12/15 Hb 11.2, iron sat 24%, mircera 75 q4wks, last dose 11/25 5mcg 12/15 Phos 5.5, Ca 10.3, PTH 1143, sensipar 30 TIW in center 12/15 alb 4.4, K 5.9  Assessment/Plan **Hyperkalemia;  Medically managed in the ED, will provide HD tonight.  Recent K 5.5 at outpt HD.  Encourage tx adherence.  **ESRD on HD:  HD today per above.   Pre/post wt.  UF as BP tolerates - UF to EDW.  EDW 68kg. Encouraged him to run full tx times.  ** A fib with RVR: cardiology consulted, currently on cardizem.  No anticoag due to nonadherence.  **Anemia:   Hb 11.6.  Follow.   **Secondary hyperparathyroidism:  Cont sensipar 30 TIW, calcitriol off now due to recent hyperca renvela 2400 TIDAC.  **HCV: on treatment it appears per med list  dispo - being admitted to obs per EDP > pt stating he will NOT stay; I had a frank discussion with him regarding risk if he signs out AMA particularly prior to HD.  Justin Mend 06/30/2020, 7:38 PM

## 2020-06-30 NOTE — ED Triage Notes (Signed)
Pt arrives via EMS for eval of shob. Last dialysis on Friday; did go today but did not complete ANY of the tx d/t personal differences/arguement with the staff. SpO2 80% room air.

## 2020-06-30 NOTE — Progress Notes (Deleted)
Primary Physician/Referring:  Charlott Rakes, MD  Patient ID: [name], male    DOB: 23-Sep-1960, 59 y.o.   MRN: [id number]  No chief complaint on file.  HPI:    [name]  is a 59 y.o. male  with very complex medical problems including severe COPD and chronic cor pulmonale with pulmonary hypertension, coronary artery disease s/p stenting to mid RCA in 2018, paroxysmal atrial fibrillation with RVR, hypertensive cardiomyopathy, dietary and medication noncompliance, end-stage renal disease on hemodialysis, chronic hepatitis B and C with cirrhosis of the liver and ascites, tobacco use, smokes 1/2 ppd, h/o marijuana use and prior cocaine use quit in 2017. Not on anticoagulation due to cirrhosis of liver and compliance with Warfarin.   He has had multiple ER visits for A fib with RVR, near syncope, shortness of breath, acute diastolic heart failure and hyperkalemia.  He has also been in the hospital with acute respiratory distress and pulmonary edema due to missed dialysis.  Last visit was on 02/07/2020, since then patient has had 2 hospital admissions for atrial fibrillation with RVR and shortness of breath secondary to missed dialysis.   Patient presents today for 1 month follow-up on atrial fibrillation and CHF, he did not bring medications or medication list with him today.  He is not currently experiencing symptoms of chest pain, palpitations, syncope, dizziness, orthopnea, PND, leg swelling.  Has chronic shortness of breath, reports it is stable at this time.  He is requesting refill of Zofran, states that he also needs blood pressure medication but he does not know which one.  ***Patient was last seen in our office 05/02/2020 by Dr. Einar Gip he presents today for 6 week follow up of atrial fibrillation and hear failure. At last office visit switch from metoprolol tartrate to metoprolol succinate. Since his last office visit patient was hospitalized again  05/20/2020-05/22/2020 with atrial fibrillation with RVR, hyperkalemia, and dyspnea after missing dialysis.   ***  Past Medical History:  Diagnosis Date  . Anemia   . Anxiety   . Arthritis    left shoulder  . Atherosclerosis of aorta ([place])   . Cardiomegaly   . Chest pain    DATE UNKNOWN, C/O PERIODICALLY  . Cocaine abuse ([place])   . COPD exacerbation ([place]) 08/17/2016  . Coronary artery disease    stent 02/22/17  . ESRD (end stage renal disease) on dialysis ([place])    "[name]; MWF" (07/04/2017)  . GERD (gastroesophageal reflux disease)    DATE UNKNOWN  . Hemorrhoids   . Hepatitis B, chronic ([place])   . Hepatitis C   . History of kidney stones   . Hyperkalemia   . Hypertension   . Kidney failure   . Metabolic bone disease    Patient denies  . Mitral stenosis   . Myocardial infarction ([place])   . Pneumonia   . Pulmonary edema   . Solitary rectal ulcer syndrome 07/2017   at flex sig for rectal bleeding  . Tubular adenoma of colon    Past Surgical History:  Procedure Laterality Date  . A/V FISTULAGRAM Left 05/26/2017   Procedure: A/V FISTULAGRAM;  Surgeon: Conrad Glen Elder, MD;  Location: [place] [facility];  Service: Cardiovascular;  Laterality: Left;  . A/V FISTULAGRAM Right 11/18/2017   Procedure: A/V FISTULAGRAM - Right Arm;  Surgeon: Elam Dutch, MD;  Location: [place] [facility];  Service: Cardiovascular;  Laterality: Right;  . APPLICATION OF WOUND VAC Left 06/14/2017   Procedure: APPLICATION  OF WOUND VAC;  Surgeon: Delana Meyer Dolores Lory, MD;  Location: [facility];  Service: Vascular;  Laterality: Left;  . AV FISTULA PLACEMENT  2012   BELIEVED WAS PLACED IN JUNE  . AV FISTULA PLACEMENT Right 08/09/2017   Procedure: Creation Right arm ARTERIOVENOUS BRACHIOCEPOHALIC FISTULA;  Surgeon: Elam Dutch, MD;  Location: [facility];  Service: Vascular;  Laterality: Right;  . AV FISTULA PLACEMENT Right 11/22/2017   Procedure: INSERTION OF ARTERIOVENOUS (AV) GORE-TEX GRAFT RIGHT UPPER ARM;   Surgeon: Elam Dutch, MD;  Location: [place];  Service: Vascular;  Laterality: Right;  . BIOPSY  01/25/2018   Procedure: BIOPSY;  Surgeon: Jerene Bears, MD;  Location: [place];  Service: Gastroenterology;;  . BIOPSY  04/10/2019   Procedure: BIOPSY;  Surgeon: Jerene Bears, MD;  Location: [facility];  Service: Gastroenterology;;  . COLONOSCOPY    . COLONOSCOPY WITH PROPOFOL N/A 01/25/2018   Procedure: COLONOSCOPY WITH PROPOFOL;  Surgeon: Jerene Bears, MD;  Location: [place];  Service: Gastroenterology;  Laterality: N/A;  . CORONARY STENT INTERVENTION N/A 02/22/2017   Procedure: CORONARY STENT INTERVENTION;  Surgeon: Nigel Mormon, MD;  Location: [place] [facility];  Service: Cardiovascular;  Laterality: N/A;  . ESOPHAGOGASTRODUODENOSCOPY (EGD) WITH PROPOFOL N/A 01/25/2018   Procedure: ESOPHAGOGASTRODUODENOSCOPY (EGD) WITH PROPOFOL;  Surgeon: Jerene Bears, MD;  Location: [place];  Service: Gastroenterology;  Laterality: N/A;  . ESOPHAGOGASTRODUODENOSCOPY (EGD) WITH PROPOFOL N/A 04/10/2019   Procedure: ESOPHAGOGASTRODUODENOSCOPY (EGD) WITH PROPOFOL;  Surgeon: Jerene Bears, MD;  Location: [facility];  Service: Gastroenterology;  Laterality: N/A;  . FLEXIBLE SIGMOIDOSCOPY N/A 07/15/2017   Procedure: FLEXIBLE SIGMOIDOSCOPY;  Surgeon: Carol Ada, MD;  Location: [place];  Service: Endoscopy;  Laterality: N/A;  . HEMORRHOID BANDING    . I & D EXTREMITY Left 06/01/2017   Procedure: IRRIGATION AND DEBRIDEMENT LEFT ARM HEMATOMA WITH LIGATION OF LEFT ARM AV FISTULA;  Surgeon: Elam Dutch, MD;  Location: [place];  Service: Vascular;  Laterality: Left;  . I & D EXTREMITY Left 06/14/2017   Procedure: IRRIGATION AND DEBRIDEMENT EXTREMITY;  Surgeon: Katha Cabal, MD;  Location: [facility];  Service: Vascular;  Laterality: Left;  . INSERTION OF DIALYSIS CATHETER  05/30/2017  . INSERTION OF DIALYSIS CATHETER N/A 05/30/2017   Procedure: INSERTION OF DIALYSIS CATHETER;   Surgeon: Elam Dutch, MD;  Location: [place];  Service: Vascular;  Laterality: N/A;  . IR PARACENTESIS  08/30/2017  . IR PARACENTESIS  09/29/2017  . IR PARACENTESIS  10/28/2017  . IR PARACENTESIS  11/09/2017  . IR PARACENTESIS  11/16/2017  . IR PARACENTESIS  11/28/2017  . IR PARACENTESIS  12/01/2017  . IR PARACENTESIS  12/06/2017  . IR PARACENTESIS  01/03/2018  . IR PARACENTESIS  01/23/2018  . IR PARACENTESIS  02/07/2018  . IR PARACENTESIS  02/21/2018  . IR PARACENTESIS  03/06/2018  . IR PARACENTESIS  03/17/2018  . IR PARACENTESIS  04/04/2018  . IR PARACENTESIS  12/28/2018  . IR PARACENTESIS  01/08/2019  . IR PARACENTESIS  01/23/2019  . IR PARACENTESIS  02/01/2019  . IR PARACENTESIS  02/19/2019  . IR PARACENTESIS  03/01/2019  . IR PARACENTESIS  03/15/2019  . IR PARACENTESIS  04/03/2019  . IR PARACENTESIS  04/12/2019  . IR PARACENTESIS  05/01/2019  . IR PARACENTESIS  05/08/2019  . IR PARACENTESIS  05/24/2019  . IR PARACENTESIS  06/12/2019  . IR PARACENTESIS  07/09/2019  . IR PARACENTESIS  07/27/2019  . IR PARACENTESIS  08/09/2019  . IR PARACENTESIS  08/21/2019  . IR PARACENTESIS  09/17/2019  . IR PARACENTESIS  10/05/2019  . IR PARACENTESIS  10/29/2019  . IR PARACENTESIS  11/08/2019  . IR PARACENTESIS  12/12/2019  . IR PARACENTESIS  01/03/2020  . IR PARACENTESIS  01/10/2020  . IR PARACENTESIS  01/17/2020  . IR PARACENTESIS  01/24/2020  . IR PARACENTESIS  01/31/2020  . IR PARACENTESIS  02/07/2020  . IR PARACENTESIS  02/21/2020  . IR PARACENTESIS  05/21/2020  . IR RADIOLOGIST EVAL & MGMT  02/14/2018  . IR RADIOLOGIST EVAL & MGMT  02/22/2019  . LEFT HEART CATH AND CORONARY ANGIOGRAPHY N/A 02/22/2017   Procedure: LEFT HEART CATH AND CORONARY ANGIOGRAPHY;  Surgeon: Nigel Mormon, MD;  Location: [place] [facility];  Service: Cardiovascular;  Laterality: N/A;  . LIGATION OF ARTERIOVENOUS  FISTULA Left 11/16/8500   Procedure: Plication of Left Arm Arteriovenous Fistula;  Surgeon: Elam Dutch, MD;  Location: [place];   Service: Vascular;  Laterality: Left;  . POLYPECTOMY    . POLYPECTOMY  01/25/2018   Procedure: POLYPECTOMY;  Surgeon: Jerene Bears, MD;  Location: [place];  Service: Gastroenterology;;  . REVISON OF ARTERIOVENOUS FISTULA Left 7/74/1287   Procedure: PLICATION OF DISTAL ANEURYSMAL SEGEMENT OF LEFT UPPER ARM ARTERIOVENOUS FISTULA;  Surgeon: Elam Dutch, MD;  Location: [place];  Service: Vascular;  Laterality: Left;  . REVISON OF ARTERIOVENOUS FISTULA Left 8/67/6720   Procedure: Plication of Left Upper Arm Fistula ;  Surgeon: Waynetta Sandy, MD;  Location: [place];  Service: Vascular;  Laterality: Left;  . SKIN GRAFT SPLIT THICKNESS LEG / FOOT Left    SKIN GRAFT SPLIT THICKNESS LEFT ARM DONOR SITE: LEFT ANTERIOR THIGH  . SKIN SPLIT GRAFT Left 07/04/2017   Procedure: SKIN GRAFT SPLIT THICKNESS LEFT ARM DONOR SITE: LEFT ANTERIOR THIGH;  Surgeon: Elam Dutch, MD;  Location: [place];  Service: Vascular;  Laterality: Left;  . THROMBECTOMY W/ EMBOLECTOMY Left 06/05/2017   Procedure: EXPLORATION OF LEFT ARM FOR BLEEDING; OVERSEWED PROXIMAL FISTULA;  Surgeon: Angelia Mould, MD;  Location: [place];  Service: Vascular;  Laterality: Left;  . WOUND EXPLORATION Left 06/03/2017   Procedure: WOUND EXPLORATION WITH WOUND VAC APPLICATION TO LEFT ARM;  Surgeon: Angelia Mould, MD;  Location: [facility] OR;  Service: Vascular;  Laterality: Left;   Family History  Problem Relation Age of Onset  . Heart disease Mother   . Lung cancer Mother   . Heart disease Father   . Malignant hyperthermia Father   . COPD Father   . Throat cancer Sister   . Esophageal cancer Sister   . Hypertension Other   . COPD Other   . Colon cancer Neg Hx   . Colon polyps Neg Hx   . Rectal cancer Neg Hx   . Stomach cancer Neg Hx     Social History   Tobacco Use  . Smoking status: Current Every Day Smoker    Packs/day: 0.50    Years: 43.00    Pack years: 21.50    Types: Cigarettes    Start date:  08/13/1973  . Smokeless tobacco: Never Used  Substance Use Topics  . Alcohol use: Not Currently    Comment: quit drinking in 2017   Marital Status: Single   ROS  Review of Systems  HENT: Negative for sore throat.   Cardiovascular: Positive for dyspnea on exertion (chronic, stable ). Negative for leg swelling, palpitations, paroxysmal nocturnal dyspnea and  syncope.  Respiratory: Negative for wheezing.   Hematologic/Lymphatic: Does not bruise/bleed easily.  Gastrointestinal: Positive for nausea. Negative for abdominal pain, hematochezia and melena.   Objective  There were no vitals taken for this visit.  Vitals with BMI 06/26/2020 06/16/2020 05/22/2020  Height 5\' 9"  - -  Weight 158 lbs - -  BMI 41.74 - -  Systolic 081 448 185  Diastolic 62 67 70  Pulse 68 67 62  Some encounter information is confidential and restricted. Go to Review Flowsheets activity to see all data.     Physical Exam Vitals reviewed.  Constitutional:      General: He is not in acute distress.    Appearance: He is normal weight. He is ill-appearing.     Comments: Appears older than stated age.  Cardiovascular:     Rate and Rhythm: Normal rate. Rhythm irregularly irregular.     Heart sounds: Murmur heard.   Early systolic murmur is present with a grade of 2/6 at the upper right sternal border.  Low-pitched rumbling crescendo presystolic murmur is present with a grade of 3/4 at the apex.     Comments: S1 normal and S2 loud. No murmur. No JVD. No edema.  Right arm AV graft noted Pulmonary:     Effort: Pulmonary effort is normal. No accessory muscle usage or respiratory distress.     Breath sounds: Normal breath sounds.  Abdominal:     General: Bowel sounds are normal. There is distension.     Palpations: Abdomen is soft.  Musculoskeletal:        General: Normal range of motion.    Laboratory examination:   Recent Labs    02/17/20 1208 02/18/20 0002 02/18/20 0215 05/19/20 0934 05/21/20 1643  05/22/20 0838 06/16/20 2323  NA 135 131* 136   < > 136 135 134*  K 6.7* 6.5* 3.7   < > 3.8 4.3 4.9  CL 92* 90* 96*   < > 95* 96* 90*  CO2 28 26 28    < > 28 27 28   GLUCOSE 105* 108* 109*   < > 102* 106* 94  BUN 40* 49* 16   < > 15 24* 18  CREATININE 5.89* 6.37* 2.90*   < > 4.31* 5.98* 5.23*  CALCIUM 9.6 9.8 9.2   < > 9.7 9.2 9.7  GFRNONAA 10* 9* 23*   < > 15* 10* 12*  GFRAA 11* 10* 27*  --   --   --   --    < > = values in this interval not displayed.   estimated creatinine clearance is 15.6 mL/min (A) (by C-G formula based on SCr of 5.23 mg/dL (H)).  CMP Latest Ref Rng & Units 06/16/2020 05/22/2020 05/21/2020  Glucose 70 - 99 mg/dL 94 106(H) 102(H)  BUN 6 - 20 mg/dL 18 24(H) 15  Creatinine 0.61 - 1.24 mg/dL 5.23(H) 5.98(H) 4.31(H)  Sodium 135 - 145 mmol/L 134(L) 135 136  Potassium 3.5 - 5.1 mmol/L 4.9 4.3 3.8  Chloride 98 - 111 mmol/L 90(L) 96(L) 95(L)  CO2 22 - 32 mmol/L 28 27 28   Calcium 8.9 - 10.3 mg/dL 9.7 9.2 9.7  Total Protein 6.5 - 8.1 g/dL - - -  Total Bilirubin 0.3 - 1.2 mg/dL - - -  Alkaline Phos 38 - 126 U/L - - -  AST 15 - 41 U/L - - -  ALT 0 - 44 U/L - - -   CBC Latest Ref Rng & Units 06/16/2020 05/22/2020 05/21/2020  WBC 4.0 - 10.5 K/uL 7.9 6.4 7.2  Hemoglobin 13.0 - 17.0 g/dL 10.3(L) 10.7(L) 10.9(L)  Hematocrit 39.0 - 52.0 % 30.1(L) 30.7(L) 31.5(L)  Platelets 150 - 400 K/uL 127(L) 121(L) 147(L)   Lipid Panel     Component Value Date/Time   CHOL 140 02/18/2020 0001   TRIG 107 02/18/2020 0001   HDL 38 (L) 02/18/2020 0001   CHOLHDL 3.7 02/18/2020 0001   VLDL 21 02/18/2020 0001   LDLCALC 81 02/18/2020 0001   HEMOGLOBIN A1C Lab Results  Component Value Date   HGBA1C 4.8 01/26/2020   MPG 91.06 01/26/2020   TSH No results for input(s): TSH in the last 8760 hours.  Lipid Panel Recent Labs    10/04/19 1400 02/18/20 0001  CHOL  --  140  TRIG 183* 107  LDLCALC  --  81  VLDL  --  21  HDL  --  38*  CHOLHDL  --  3.7    External labs:   03/11/2018:  TSH 1.32 normal  Medications and allergies   Allergies  Allergen Reactions  . Tramadol Itching and Other (See Comments)  . Grass Extracts [Gramineae Pollens]     Other reaction(s): Sneezing  . Morphine And Related Other (See Comments)    Stomach pain  . Pollen Extract Other (See Comments)    Other reaction(s): Sneezing (finding)  . Acetaminophen Nausea Only    Stomach ache   . Aspirin Itching and Other (See Comments)    STOMACH PAIN   . Clonidine Derivatives Itching     Current Outpatient Medications  Medication Instructions  . albuterol (VENTOLIN HFA) 108 (90 Base) MCG/ACT inhaler 2 puffs, Inhalation, Every 4 hours PRN  . amiodarone (PACERONE) 200 mg, Oral, Daily  . cefdinir (OMNICEF) 300 mg, Oral, Every M-W-F (1800)  . diltiazem (CARDIZEM CD) 120 MG 24 hr capsule TAKE 1 CAPSULE BY MOUTH EVERY DAY  . diphenhydrAMINE (BENADRYL) 25 mg, Oral, Every 6 hours PRN  . gabapentin (NEURONTIN) 300 mg, Oral, 2 times daily  . metoprolol succinate (TOPROL XL) 25 mg, Oral, Daily  . ondansetron (ZOFRAN) 4 MG tablet TAKE 1 TABLET BY MOUTH EVERY 8 HOURS AS NEEDED FOR NAUSEA AND VOMITING  . Oxycodone HCl 10 mg, Oral, 3 times daily PRN  . pantoprazole (PROTONIX) 40 mg, Oral, 2 times daily  . sevelamer carbonate (RENVELA) 2,400 mg, Oral, See admin instructions, Take 3 tablets (2400 mg) by mouth up to three times daily with meals  . tenofovir (VIREAD) 300 MG tablet Take 1 by mouth every Monday.   Radiology:   No results found.  Cardiac Studies:   Coronary Angiogram 02/22/2017: No significant disease on left, mid RCA high-grade stenosis status post 3.5 x 23 mm Xience [place] DES.  Carotid Doppler 10/28/2016: Stenosis in the right common carotid artery (<50%) with severe heteregenous plaque. Stenosis in the left internal carotid artery (16-49%). Severe heteregenous plaque in the left common carotid artery with < 50% stenosis. Mild stenosis in the left external carotid artery (<50%). Antegrade  vertebral artery flow. Follow up in one year is appropriate if clinically indicated.  Lower Venous DVT Study 10/05/2019: BILATERAL: - No evidence of deep vein thrombosis seen in the lower extremities, bilaterally.  RIGHT & LEFT: - No cystic structure found in the popliteal fossa.   Echocardiogram 02/05/2020: 1. Left ventricular ejection fraction, by estimation, is 50 to 55%. The left ventricle has normal function. There is severe left ventricular hypertrophy. Left ventricular diastolic function could not be evaluated. I cannot  exclude septal hypokinesis. Endocardium not well visualized. septal motion also is consistent with LBBB.  2. Right ventricular systolic function is mildly reduced. The right ventricular size is moderately enlarged. Severely increased right ventricular wall thickness. There is moderately elevated pulmonary artery systolic pressure. The estimated right ventricular systolic pressure is 29.5 mmHg.  3. Left atrial size was severely dilated.  4. Right atrial size was severely dilated.  5. The mitral valve is normal in structure. Mild mitral valve regurgitation.  6. Dilated TV annulus. Tricuspid valve regurgitation is moderate to severe.  7. AV sclerosis without stenosis. The aortic valve is tricuspid. Aortic valve regurgitation is trivial. Mild aortic valve sclerosis is present, with no evidence of aortic valve stenosis. Aortic valve mean gradient measures 8.3 mmHg.  8. The inferior vena cava is dilated in size with <50% respiratory variability, suggesting right atrial pressure of 15 mmHg.    EKG   ***  EKG 03/25/2020: Atrial fibrillation with controlled ventricular response at a rate of 81 bpm, complete left bundle branch block. No further analysis.  Compared to EKG 02/07/2020 ventricular rate controlled.   EKG 02/07/2020: Atrial fibrillation with rapid ventricular response, ventricular rate 111 bpm, left bundle branch block.  No further analysis.  EKG 12/27/2019:  Sinus rhythm with first-degree AV block at rate of 77 bpm, rare PACs, left bundle branch block.  No further analysis.  Assessment     ICD-10-CM   1. Paroxysmal atrial fibrillation (HCC)  I48.0   2. Cor pulmonale, chronic (HCC)  I27.81   3. CKD (chronic kidney disease) stage V requiring chronic dialysis (HCC)  N18.6    Z99.2    .   No orders of the defined types were placed in this encounter.   There are no discontinued medications.  Recommendations:   [name]  is a 59 y.o.  male  with very complex medical problems including severe COPD and chronic cor pulmonale with pulmonary hypertension, coronary artery disease s/p stenting to mid RCA in 2018, paroxysmal atrial fibrillation with RVR, hypertensive cardiomyopathy, dietary and medication noncompliance, end-stage renal disease on hemodialysis, chronic hepatitis B and C with cirrhosis of the liver and ascites, tobacco use, smokes 1/2 ppd, h/o marijuana use and prior cocaine use quit in 2017. Not on anticoagulation due to cirrhosis of liver and compliance with Warfarin.  He has had multiple ER visits for A fib with RVR, syncope, shortness of breath and hyperkalemia.  He has also been in the hospital with acute respiratory distress and pulmonary edema due to missed dialysis.   Presents today for 6-week follow-up on atrial fibrillation and CHF.  He walked no clinic yesterday and wanted to stop metoprolol tartrate as she could not tolerate this and I will switch him to metoprolol succinate which he is about to pick up today.  He requests Zofran for as needed use for nausea which are prescribed.  He also states that he is out of one of the blood pressure medications on and does not recollect which.  I think it is diltiazem but he will call us back.  There are currently no clinical signs of heart failure and patient is in atrial fibrillation and is currently well rate controlled.  He is presently taking amiodarone, diltiazem and will be  starting metoprolol succinate from tartrate switched yesterday.  Follow-up in 6 weeks for atrial fibrillation, CHF. .   ***

## 2020-06-30 NOTE — H&P (Signed)
History and Physical    [name] VXB:[id number] DOB: 11/12/1960 DOA: 06/30/2020  PCP: Charlott Rakes, MD   Patient coming from:   Home  Chief Complaint: chest pressure and palpitations with SOB  HPI: [name] is a 59 y.o. male with medical history significant for ESRD on HD MWF at [facility], HTN, HCV with cirrhosis, COPD, PAF, chronic pain, medical noncompliance with truncated HD treatments, polysubstance abusewho presents for evaluation of chest pressure with palpitations and SOB. Pt went to dialysis today but had a disagreement with a staff member there so he left without having his HD session. He later developed chest pressure that he describes as a heaviness, "like something sitting on my chest". The pressure does not radiate. He has first of breath with exertion and was clammy according to his wife who is at bedside.  He also reports having palpitations.  When he arrived in the emergency room he was found to be in atrial fibrillation with RVR with heart rate in the 140s.  He is also found to have an elevated potassium level of 7.4 with hemolysis and repeat lab showed potassium 7.2 without hemolysis.  He was given calcium gluconate, insulin and dextrose in the emergency room.  He was started on a Cardizem infusion for the atrial fibrillation with RVR.  He continues to be in atrial fibrillation but heart rate is now improved to the 90-110 range.  His EKG also showed peaked T waves wide-complex atrial fibrillation. ER provider discussed with on-call cardiology who stated they will see patient in the morning.  Patient had mildly elevated troponin of 39 initially.  Repeat troponin was 37.  Patient has had higher troponins in the past at his baseline.  ER physician also discussed with nephrology who will evaluate patient in arrange for hemodialysis tonight with his hyperkalemia with EKG changes in light of him missing his session this morning.  Review of Systems:  General:  Denies weakness, fever, chills, weight loss, night sweats.  Denies dizziness.  Denies change in appetite HENT: Denies head trauma, headache, denies change in hearing, tinnitus.  Denies nasal congestion or bleeding.  Denies sore throat, sores in mouth.  Denies difficulty swallowing Eyes: Denies blurry vision, pain in eye, drainage.  Denies discoloration of eyes. Neck: Denies pain.  Denies swelling.  Denies pain with movement. Cardiovascular: Reports substernal chest pressure, palpitations.  Denies edema.  Denies orthopnea Respiratory: Reports shortness of breath with excertion, Denies cough. Denies sputum production Gastrointestinal: Denies abdominal pain, swelling.  Denies nausea, vomiting, diarrhea.  Denies melena.  Denies hematemesis. Musculoskeletal: Denies limitation of movement.  Denies deformity or swelling.  Denies pain.  Denies arthralgias or myalgias. Genitourinary: Denies pelvic pain.  Denies urinary frequency or hesitancy.  Denies dysuria.  Skin: Denies rash.  Denies petechiae, purpura, ecchymosis. Neurological: Denies headache.  Denies syncope.  Denies seizure activity.  Denies weakness or paresthesia.  Denies slurred speech, drooping face.  Denies visual change. Psychiatric: Denies depression, anxiety.    Past Medical History:  Diagnosis Date  . Anemia   . Anxiety   . Arthritis    left shoulder  . Atherosclerosis of aorta ([place])   . Cardiomegaly   . Chest pain    DATE UNKNOWN, C/O PERIODICALLY  . Cocaine abuse ([place])   . COPD exacerbation ([place]) 08/17/2016  . Coronary artery disease    stent 02/22/17  . Dysrhythmia   . ESRD (end stage renal disease) on dialysis ([place])    "[name]; MWF" (07/04/2017)  .  GERD (gastroesophageal reflux disease)    DATE UNKNOWN  . Hemorrhoids   . Hepatitis B, chronic ([place])   . Hepatitis C   . History of kidney stones   . Hyperkalemia   . Hypertension   . Kidney failure   . Metabolic bone disease    Patient denies  . Mitral stenosis   .  Myocardial infarction ([place])   . Pneumonia   . Pulmonary edema   . Solitary rectal ulcer syndrome 07/2017   at flex sig for rectal bleeding  . Tubular adenoma of colon     Past Surgical History:  Procedure Laterality Date  . A/V FISTULAGRAM Left 05/26/2017   Procedure: A/V FISTULAGRAM;  Surgeon: Conrad Willoughby Hills, MD;  Location: [place] [facility];  Service: Cardiovascular;  Laterality: Left;  . A/V FISTULAGRAM Right 11/18/2017   Procedure: A/V FISTULAGRAM - Right Arm;  Surgeon: Elam Dutch, MD;  Location: [place] [facility];  Service: Cardiovascular;  Laterality: Right;  . APPLICATION OF WOUND VAC Left 06/14/2017   Procedure: APPLICATION OF WOUND VAC;  Surgeon: Katha Cabal, MD;  Location: [facility];  Service: Vascular;  Laterality: Left;  . AV FISTULA PLACEMENT  2012   BELIEVED WAS PLACED IN JUNE  . AV FISTULA PLACEMENT Right 08/09/2017   Procedure: Creation Right arm ARTERIOVENOUS BRACHIOCEPOHALIC FISTULA;  Surgeon: Elam Dutch, MD;  [facility];  Service: Vascular;  Laterality: Right;  . AV FISTULA PLACEMENT Right 11/22/2017   Procedure: INSERTION OF ARTERIOVENOUS (AV) GORE-TEX GRAFT RIGHT UPPER ARM;  Surgeon: Elam Dutch, MD;  Location: [place];  Service: Vascular;  Laterality: Right;  . BIOPSY  01/25/2018   Procedure: BIOPSY;  Surgeon: Jerene Bears, MD;  Location: [place];  Service: Gastroenterology;;  . BIOPSY  04/10/2019   Procedure: BIOPSY;  Surgeon: Jerene Bears, MD;  Location: [facility];  Service: Gastroenterology;;  . COLONOSCOPY    . COLONOSCOPY WITH PROPOFOL N/A 01/25/2018   Procedure: COLONOSCOPY WITH PROPOFOL;  Surgeon: Jerene Bears, MD;  Location: [place];  Service: Gastroenterology;  Laterality: N/A;  . CORONARY STENT INTERVENTION N/A 02/22/2017   Procedure: CORONARY STENT INTERVENTION;  Surgeon: Nigel Mormon, MD;  Location: [place] [facility];  Service: Cardiovascular;  Laterality: N/A;  . ESOPHAGOGASTRODUODENOSCOPY (EGD) WITH  PROPOFOL N/A 01/25/2018   Procedure: ESOPHAGOGASTRODUODENOSCOPY (EGD) WITH PROPOFOL;  Surgeon: Jerene Bears, MD;  Location: [place];  Service: Gastroenterology;  Laterality: N/A;  . ESOPHAGOGASTRODUODENOSCOPY (EGD) WITH PROPOFOL N/A 04/10/2019   Procedure: ESOPHAGOGASTRODUODENOSCOPY (EGD) WITH PROPOFOL;  Surgeon: Jerene Bears, MD;  Location: [facility];  Service: Gastroenterology;  Laterality: N/A;  . FLEXIBLE SIGMOIDOSCOPY N/A 07/15/2017   Procedure: FLEXIBLE SIGMOIDOSCOPY;  Surgeon: Carol Ada, MD;  Location: [place];  Service: Endoscopy;  Laterality: N/A;  . HEMORRHOID BANDING    . I & D EXTREMITY Left 06/01/2017   Procedure: IRRIGATION AND DEBRIDEMENT LEFT ARM HEMATOMA WITH LIGATION OF LEFT ARM AV FISTULA;  Surgeon: Elam Dutch, MD;  Location: [place];  Service: Vascular;  Laterality: Left;  . I & D EXTREMITY Left 06/14/2017   Procedure: IRRIGATION AND DEBRIDEMENT EXTREMITY;  Surgeon: Katha Cabal, MD;  Location: [facility];  Service: Vascular;  Laterality: Left;  . INSERTION OF DIALYSIS CATHETER  05/30/2017  . INSERTION OF DIALYSIS CATHETER N/A 05/30/2017   Procedure: INSERTION OF DIALYSIS CATHETER;  Surgeon: Elam Dutch, MD;  Location: [place];  Service: Vascular;  Laterality: N/A;  . IR PARACENTESIS  08/30/2017  . IR PARACENTESIS  09/29/2017  . IR PARACENTESIS  10/28/2017  . IR PARACENTESIS  11/09/2017  . IR PARACENTESIS  11/16/2017  . IR PARACENTESIS  11/28/2017  . IR PARACENTESIS  12/01/2017  . IR PARACENTESIS  12/06/2017  . IR PARACENTESIS  01/03/2018  . IR PARACENTESIS  01/23/2018  . IR PARACENTESIS  02/07/2018  . IR PARACENTESIS  02/21/2018  . IR PARACENTESIS  03/06/2018  . IR PARACENTESIS  03/17/2018  . IR PARACENTESIS  04/04/2018  . IR PARACENTESIS  12/28/2018  . IR PARACENTESIS  01/08/2019  . IR PARACENTESIS  01/23/2019  . IR PARACENTESIS  02/01/2019  . IR PARACENTESIS  02/19/2019  . IR PARACENTESIS  03/01/2019  . IR PARACENTESIS  03/15/2019  . IR PARACENTESIS   04/03/2019  . IR PARACENTESIS  04/12/2019  . IR PARACENTESIS  05/01/2019  . IR PARACENTESIS  05/08/2019  . IR PARACENTESIS  05/24/2019  . IR PARACENTESIS  06/12/2019  . IR PARACENTESIS  07/09/2019  . IR PARACENTESIS  07/27/2019  . IR PARACENTESIS  08/09/2019  . IR PARACENTESIS  08/21/2019  . IR PARACENTESIS  09/17/2019  . IR PARACENTESIS  10/05/2019  . IR PARACENTESIS  10/29/2019  . IR PARACENTESIS  11/08/2019  . IR PARACENTESIS  12/12/2019  . IR PARACENTESIS  01/03/2020  . IR PARACENTESIS  01/10/2020  . IR PARACENTESIS  01/17/2020  . IR PARACENTESIS  01/24/2020  . IR PARACENTESIS  01/31/2020  . IR PARACENTESIS  02/07/2020  . IR PARACENTESIS  02/21/2020  . IR PARACENTESIS  05/21/2020  . IR RADIOLOGIST EVAL & MGMT  02/14/2018  . IR RADIOLOGIST EVAL & MGMT  02/22/2019  . LEFT HEART CATH AND CORONARY ANGIOGRAPHY N/A 02/22/2017   Procedure: LEFT HEART CATH AND CORONARY ANGIOGRAPHY;  Surgeon: Nigel Mormon, MD;  Location: [place] [facility];  Service: Cardiovascular;  Laterality: N/A;  . LIGATION OF ARTERIOVENOUS  FISTULA Left 07/17/1094   Procedure: Plication of Left Arm Arteriovenous Fistula;  Surgeon: Elam Dutch, MD;  Location: [place];  Service: Vascular;  Laterality: Left;  . POLYPECTOMY    . POLYPECTOMY  01/25/2018   Procedure: POLYPECTOMY;  Surgeon: Jerene Bears, MD;  Location: [place];  Service: Gastroenterology;;  . REVISON OF ARTERIOVENOUS FISTULA Left 0/45/4098   Procedure: PLICATION OF DISTAL ANEURYSMAL SEGEMENT OF LEFT UPPER ARM ARTERIOVENOUS FISTULA;  Surgeon: Elam Dutch, MD;  Location: [place];  Service: Vascular;  Laterality: Left;  . REVISON OF ARTERIOVENOUS FISTULA Left 07/30/1476   Procedure: Plication of Left Upper Arm Fistula ;  Surgeon: Waynetta Sandy, MD;  Location: [place];  Service: Vascular;  Laterality: Left;  . SKIN GRAFT SPLIT THICKNESS LEG / FOOT Left    SKIN GRAFT SPLIT THICKNESS LEFT ARM DONOR SITE: LEFT ANTERIOR THIGH  . SKIN SPLIT GRAFT Left 07/04/2017    Procedure: SKIN GRAFT SPLIT THICKNESS LEFT ARM DONOR SITE: LEFT ANTERIOR THIGH;  Surgeon: Elam Dutch, MD;  Location: [place];  Service: Vascular;  Laterality: Left;  . THROMBECTOMY W/ EMBOLECTOMY Left 06/05/2017   Procedure: EXPLORATION OF LEFT ARM FOR BLEEDING; OVERSEWED PROXIMAL FISTULA;  Surgeon: Angelia Mould, MD;  Location: [place];  Service: Vascular;  Laterality: Left;  . WOUND EXPLORATION Left 06/03/2017   Procedure: WOUND EXPLORATION WITH WOUND VAC APPLICATION TO LEFT ARM;  Surgeon: Angelia Mould, MD;  Location: [place];  Service: Vascular;  Laterality: Left;    Social History  reports that he has been smoking cigarettes. He started  smoking about 46 years ago. He has a 43.00 pack-year smoking history. He has never used smokeless tobacco. He reports previous alcohol use. He reports current drug use. Drugs: Marijuana and Cocaine.  Allergies  Allergen Reactions  . Tramadol Itching and Other (See Comments)    Other reaction(s): Unknown  . Grass Extracts [Gramineae Pollens]     Other reaction(s): Sneezing  . Morphine And Related Other (See Comments)    Stomach pain  . Pollen Extract Other (See Comments)    Other reaction(s): Sneezing (finding)  . Acetaminophen Nausea Only    Stomach ache   . Aspirin Itching and Other (See Comments)    STOMACH PAIN  Other reaction(s): Unknown  . Clonidine Derivatives Itching    Family History  Problem Relation Age of Onset  . Heart disease Mother   . Lung cancer Mother   . Heart disease Father   . Malignant hyperthermia Father   . COPD Father   . Throat cancer Sister   . Esophageal cancer Sister   . Hypertension Other   . COPD Other   . Colon cancer Neg Hx   . Colon polyps Neg Hx   . Rectal cancer Neg Hx   . Stomach cancer Neg Hx      Prior to Admission medications   Medication Sig Start Date End Date Taking? Authorizing Provider  cefdinir (OMNICEF) 300 MG capsule Take 1 capsule (300 mg total) by mouth every  Monday, Wednesday, and Friday at 6 PM. 05/23/20  Yes Maness, Arnette Norris, MD  metoprolol succinate (TOPROL XL) 25 MG 24 hr tablet Take 1 tablet (25 mg total) by mouth daily. 05/01/20  Yes Adrian Prows, MD  albuterol (VENTOLIN HFA) 108 (90 Base) MCG/ACT inhaler Inhale 2 puffs into the lungs every 4 (four) hours as needed for wheezing or shortness of breath.  02/03/20   [provider]  amiodarone (PACERONE) 200 MG tablet Take 1 tablet (200 mg total) by mouth daily. 01/29/20   Shelly Coss, MD  diltiazem (CARDIZEM CD) 120 MG 24 hr capsule TAKE 1 CAPSULE BY MOUTH EVERY DAY 06/11/20   Adrian Prows, MD  diphenhydrAMINE (BENADRYL) 25 mg capsule Take 1 capsule (25 mg total) by mouth every 6 (six) hours as needed for itching or allergies. 01/29/20   Shelly Coss, MD  gabapentin (NEURONTIN) 300 MG capsule Take 1 capsule (300 mg total) by mouth in the morning and at bedtime. 06/26/20   Charlott Rakes, MD  ondansetron (ZOFRAN) 4 MG tablet TAKE 1 TABLET BY MOUTH EVERY 8 HOURS AS NEEDED FOR NAUSEA AND VOMITING 06/26/20   Charlott Rakes, MD  Oxycodone HCl 10 MG TABS Take 10 mg by mouth 3 (three) times daily as needed (pain).  01/24/20   [provider]  pantoprazole (PROTONIX) 40 MG tablet Take 1 tablet (40 mg total) by mouth 2 (two) times daily. 03/25/20   Cantwell, Celeste C, PA-C  sevelamer carbonate (RENVELA) 800 MG tablet Take 2,400 mg by mouth See admin instructions. Take 3 tablets (2400 mg) by mouth up to three times daily with meals    [provider]  tenofovir (VIREAD) 300 MG tablet Take 1 by mouth every Monday. 05/22/20   Delora Fuel, MD    Physical Exam: Vitals:   06/30/20 1800 06/30/20 1815 06/30/20 1830 06/30/20 2045  BP: 99/61 (!) 92/59 (!) 91/59 (!) 90/53  Pulse: (!) 104 92  (!) 102  Resp: (!) 26 (!) 21 18 19   Temp:      TempSrc:  SpO2: 95% 96%  91%    Constitutional: NAD, calm, comfortable Vitals:   06/30/20 1800 06/30/20 1815 06/30/20 1830 06/30/20 2045  BP:  99/61 (!) 92/59 (!) 91/59 (!) 90/53  Pulse: (!) 104 92  (!) 102  Resp: (!) 26 (!) 21 18 19   Temp:      TempSrc:      SpO2: 95% 96%  91%   General: WDWN, Alert and oriented x3.  Cantankerous Eyes: EOMI, PERRL, conjunctivae normal.  Sclera nonicteric HENT:  [place]/AT, external ears normal.  Nares patent without epistasis.  Mucous membranes are moist. Posterior pharynx clear of any exudate or lesions.  Neck: Soft, normal range of motion, supple, no masses, no thyromegaly.  Trachea midline Respiratory: Equal breath sounds, mildly diminished.  Mild diffuse Rales.  Mild expiratory wheezing.  No crackles. Normal respiratory effort. No accessory muscle use.  Cardiovascular: Irregularly irregular with fluctuating rate between 90-110.  no murmurs / rubs / gallops. No extremity edema. 1+ pedal pulses.  Abdomen: Soft, no tenderness, nondistended, no rebound or guarding.  No masses palpated. No hepatosplenomegaly. Bowel sounds normoactive Musculoskeletal: FROM. no clubbing / cyanosis. No joint deformity upper and lower extremities. Normal muscle tone. AV fistula in right upper extremity. Skin: Warm, dry, intact no rashes, lesions, ulcers. No induration Neurologic: CN 2-12 grossly intact.  Normal speech.  Sensation intact, patella DTR +1 bilaterally.  Psychiatric: Normal judgment and insight.  Normal mood.    Labs on Admission: I have personally reviewed following labs and imaging studies  CBC: Recent Labs  Lab 06/30/20 1755 06/30/20 1803  WBC 12.1*  --   HGB 11.6* 12.2*  HCT 34.3* 36.0*  MCV 83.5  --   PLT 170  --     Basic Metabolic Panel: Recent Labs  Lab 06/30/20 1755 06/30/20 1803  NA 130* 129*  K 7.4* 7.2*  CL 89* 95*  CO2 24  --   GLUCOSE 85 81  BUN 57* 67*  CREATININE 9.76* 9.60*  CALCIUM 10.2  --     GFR: Estimated Creatinine Clearance: 8.5 mL/min (A) (by C-G formula based on SCr of 9.6 mg/dL (H)).  Liver Function Tests: No results for input(s): AST, ALT, ALKPHOS, BILITOT,  PROT, ALBUMIN in the last 168 hours.  Urine analysis:    Component Value Date/Time   COLORURINE YELLOW 07/16/2011 1619   APPEARANCEUR CLEAR 07/16/2011 1619   LABSPEC 1.018 07/16/2011 1619   PHURINE 7.5 07/16/2011 1619   GLUCOSEU 250 (A) 07/16/2011 1619   HGBUR MODERATE (A) 07/16/2011 1619   BILIRUBINUR SMALL (A) 07/16/2011 1619   KETONESUR NEGATIVE 07/16/2011 1619   PROTEINUR >300 (A) 07/16/2011 1619   UROBILINOGEN 1.0 07/16/2011 1619   NITRITE NEGATIVE 07/16/2011 1619   LEUKOCYTESUR SMALL (A) 07/16/2011 1619    Radiological Exams on Admission: DG Chest Port 1 View  Result Date: 06/30/2020 CLINICAL DATA:  59 year old male with shortness of breath and chest pressure. EXAM: PORTABLE CHEST 1 VIEW COMPARISON:  Chest radiograph dated 06/16/2020. FINDINGS: Mild eventration of the left hemidiaphragm. There are bibasilar densities, likely atelectasis. No focal consolidation, pleural effusion, pneumothorax. There is mild cardiomegaly. Atherosclerotic calcification of the aorta. No acute osseous pathology. IMPRESSION: 1. No acute cardiopulmonary process. 2. Mild cardiomegaly. Electronically Signed   By: Anner Crete M.D.   On: 06/30/2020 17:49    EKG: Independently reviewed.  EKG shows atrial fibrillation.  LVH.  Peak T waves are present.  QTc 536  Assessment/Plan Principal Problem:   Atrial fibrillation with RVR  Mr.  Bail is admitted to cardiac telemetry floor.  Patient placed on Cardizem infusion emergency room which will be continued to stabilize atrial fibrillation with RVR.  Once stabilized patient will be converted back to p.o. Cardizem as tolerated and Cardizem infusion weaned.  Patient placed on Eliquis for anticoagulation with atrial fibrillation.  Patient given renal dosing of Eliquis Patient has elevated troponin level of 37.  Patient has history of elevated troponin levels at baseline.  Will check serial troponins through the night  Active Problems:   Hyperkalemia Initial  potassium was 7.4 with a hemolyzed sample.  Redrawn sample was 7.2 without hemolysis.  Patient was given calcium gluconate, D50 and insulin in the emergency room to lower potassium level.  Patient is also been given Lokelma.  Patient has EKG changes with peaked T waves.  Will monitor potassium level. Patient will be hemodialyzed tonight by nephrology    End-stage renal disease on hemodialysis  Patient is on hemodialysis Monday Wednesdays and Fridays.  He missed his session today due to having disagreement with a staff member at the dialysis center.  Nephrology's been consulted and will dialyze patient tonight    Chest pain Patient describes a chest pressure in the substernal region and does not radiate.  Will monitor on telemetry.  Check serial troponin levels.  Patient anticoagulated    Essential hypertension Continue metoprolol.  Patient will be resumed on Cardizem p.o. once Cardizem infusion weaned. Monitor blood pressure    COPD (chronic obstructive pulmonary disease)  Patient continued on albuterol every 4 hours as needed for shortness of breath, cough, wheezing.  He is not on any other inhalers.  Start Spiriva    QT prolongation Chronic.  Avoid medications which could further prolong QT interval    Tobacco use Patient encouraged to discontinue tobacco use.  Health hazards of continued smoking reviewed.  Nicotine patch as needed to prevent nicotine withdrawal.  Smoke cessation education to be provided for discharge    DVT prophylaxis: Patient placed on Eliquis at renal dosing for anticoagulation with atrial fibrillation Code Status:   Full code Family Communication:  Diagnosis and plan discussed with patient and wife who is at bedside.  Patient verbalized understanding and agrees with plan.  Further recommendations to follow as clinically indicated Disposition Plan:   Patient is from:  Home  Anticipated DC to:  Home  Anticipated DC date:  Anticipate more than 2 midnight stay in the  hospital  Anticipated DC barriers: No barriers to discharge identified at this time  Consults called:  Nephrology-patient be hemodialyzed tonight, cardiology-consulted by ER physician and will see patient in the morning Admission status:  Inpatient     Yevonne Aline Rox Mcgriff MD [facility]  How to contact the [facility] Attending or Consulting provider [place] or covering provider during after hours [place], for this patient?   1. Check the care team in [facility] and look for a) attending/consulting [facility] provider listed and b) the [facility] team listed 2. Log into [email or web address] and use [place]'s universal password to access. If you do not have the password, please contact the hospital operator. 3. Locate the [facility] provider you are looking for under [facility] and page to a number that you can be directly reached. 4. If you still have difficulty reaching the provider, please page the [facility] (Director on Call) for the Hospitalists listed on amion for assistance.  06/30/2020, 9:04 PM

## 2020-06-30 NOTE — ED Provider Notes (Addendum)
[facility] EMERGENCY DEPARTMENT Provider Note   CSN: [id number] Arrival date & time: 06/30/20  1655     History Chief Complaint  Patient presents with  . Shortness of Breath    [name] is a 59 y.o. male.  HPI  59 yo male esrd m,w,f presents with dyspnea and chest pressure after leaving d center but not receiving dialysis.  He reports he received just last dialysis on Friday.  He states that he did not undergo dialysis because there is a staff member there that he does not get along with someone there. He is now sob and having a new central chest pain.       Past Medical History:  Diagnosis Date  . Anemia   . Anxiety   . Arthritis    left shoulder  . Atherosclerosis of aorta ([place])   . Cardiomegaly   . Chest pain    DATE UNKNOWN, C/O PERIODICALLY  . Cocaine abuse ([place])   . COPD exacerbation ([place]) 08/17/2016  . Coronary artery disease    stent 02/22/17  . ESRD (end stage renal disease) on dialysis ([place])    "[name]; MWF" (07/04/2017)  . GERD (gastroesophageal reflux disease)    DATE UNKNOWN  . Hemorrhoids   . Hepatitis B, chronic ([place])   . Hepatitis C   . History of kidney stones   . Hyperkalemia   . Hypertension   . Kidney failure   . Metabolic bone disease    Patient denies  . Mitral stenosis   . Myocardial infarction ([place])   . Pneumonia   . Pulmonary edema   . Solitary rectal ulcer syndrome 07/2017   at [facility] for rectal bleeding  . Tubular adenoma of colon     Patient Active Problem List   Diagnosis Date Noted  . DNR (do not resuscitate)   . CAP (community acquired pneumonia) 02/09/2020  . Leukocytosis 01/19/2020  . Tobacco use 01/19/2020  . Acute pulmonary edema ([place]) 12/21/2019  . Acetaminophen overdose 10/27/2019  . ESRD (end stage renal disease) ([place])   . Goals of care, counseling/discussion   . Hypoxia 10/04/2019  . Advanced care planning/counseling discussion   . Renal failure 09/26/2019  . Dyspnea  06/09/2019  . Acquired thrombophilia ([place]) 06/05/2019  . A-fib ([place]) 05/30/2019  . Atrial fibrillation with RVR ([place]) 05/29/2019  . Melena   . Pressure injury of skin 03/09/2019  . Abdominal distention   . Volume overload 12/28/2018  . Sepsis ([place]) 09/12/2018  . Coronary artery disease involving native coronary artery of native heart without angina pectoris 03/11/2018  . Benign neoplasm of cecum   . Benign neoplasm of ascending colon   . Benign neoplasm of descending colon   . Benign neoplasm of rectum   . AF (paroxysmal atrial fibrillation) ([place]) 01/23/2018  . Hx of colonic polyps 01/20/2018  . End-stage renal disease on hemodialysis ([place]) 11/21/2017  . GERD (gastroesophageal reflux disease) 11/16/2017  . Decompensated hepatic cirrhosis ([place]) 11/15/2017  . Palliative care by specialist   . Hyponatremia 11/04/2017  . SBP (spontaneous bacterial peritonitis) ([place]) 10/30/2017  . Liver disease, chronic 10/30/2017  . SOB (shortness of breath)   . Abdominal pain 10/28/2017  . Upper airway cough syndrome with flattening on f/v loop 10/13/17 c/w vcd 10/17/2017  . Elevated diaphragm 10/13/2017  . Ileus ([place]) 09/29/2017  . QT prolongation 09/29/2017  . Malnutrition of moderate degree 09/29/2017  . Sinus congestion 09/03/2017  . Symptomatic anemia 09/02/2017  .  Cirrhosis of liver with ascites ([place]) 09/02/2017  . Left bundle branch block 09/02/2017  . Mitral stenosis 09/02/2017  . Hematochezia 07/15/2017  . Wide-complex tachycardia ([place])   . Endotracheally intubated   . ESRD on dialysis ([place]) 07/04/2017  . CKD (chronic kidney disease) stage V requiring chronic dialysis ([place]) 06/18/2017  . History of Cocaine abuse ([place]) 06/18/2017  . Hypertension 06/18/2017  . Infection of AV graft for dialysis ([place]) 06/18/2017  . Anxiety 06/18/2017  . Anemia due to chronic kidney disease 06/18/2017  . Atypical atrial flutter ([place]) 06/18/2017  . Personality disorder ([place]) 06/13/2017  . Cellulitis  06/12/2017  . Adjustment disorder with mixed anxiety and depressed mood 06/10/2017  . Suicidal ideation 06/10/2017  . Arm wound, left, sequela 06/10/2017  . Dyspnea on exertion 05/29/2017  . Tachycardia 05/29/2017  . Hyperkalemia 05/22/2017  . Acute metabolic encephalopathy   . Anemia 04/23/2017  . Ascites 04/23/2017  . COPD (chronic obstructive pulmonary disease) ([place]) 04/23/2017  . Acute on chronic respiratory failure with hypoxia ([place]) 03/25/2017  . Arrhythmia 03/25/2017  . COPD GOLD 0 with flattening on inps f/v  09/27/2016  . Essential hypertension 09/27/2016  . Fluid overload 08/30/2016  . COPD exacerbation ([place]) 08/17/2016  . Hypertensive urgency 08/17/2016  . Problem with dialysis access ([place]) 07/23/2016  . Chronic hepatitis B ([place]) 03/05/2014  . Chronic hepatitis C without hepatic coma ([place]) 03/05/2014  . Internal hemorrhoids with bleeding, swelling and itching 03/05/2014  . Thrombocytopenia ([place]) 03/05/2014  . Chest pain 02/27/2014  . Alcohol abuse 04/14/2009  . Nicotine dependence, cigarettes, uncomplicated 42/35/3614  . GANGLION CYST 04/14/2009    Past Surgical History:  Procedure Laterality Date  . A/V FISTULAGRAM Left 05/26/2017   Procedure: A/V FISTULAGRAM;  Surgeon: Conrad Kalaoa, MD;  Location: [facility];  Service: Cardiovascular;  Laterality: Left;  . A/V FISTULAGRAM Right 11/18/2017   Procedure: A/V FISTULAGRAM - Right Arm;  Surgeon: Elam Dutch, MD;  Location: [facility];  Service: Cardiovascular;  Laterality: Right;  . APPLICATION OF WOUND VAC Left 06/14/2017   Procedure: APPLICATION OF WOUND VAC;  Surgeon: Katha Cabal, MD;  Location: [facility];  Service: Vascular;  Laterality: Left;  . AV FISTULA PLACEMENT  2012   BELIEVED WAS PLACED IN JUNE  . AV FISTULA PLACEMENT Right 08/09/2017   Procedure: Creation Right arm ARTERIOVENOUS BRACHIOCEPOHALIC FISTULA;  Surgeon: Elam Dutch, MD;  Location: [facility];  Service: Vascular;   Laterality: Right;  . AV FISTULA PLACEMENT Right 11/22/2017   Procedure: INSERTION OF ARTERIOVENOUS (AV) GORE-TEX GRAFT RIGHT UPPER ARM;  Surgeon: Elam Dutch, MD;  Location: [facility];  Service: Vascular;  Laterality: Right;  . BIOPSY  01/25/2018   Procedure: BIOPSY;  Surgeon: Jerene Bears, MD;  Location: [facility];  Service: Gastroenterology;;  . BIOPSY  04/10/2019   Procedure: BIOPSY;  Surgeon: Jerene Bears, MD;  Location: [facility];  Service: Gastroenterology;;  . COLONOSCOPY    . COLONOSCOPY WITH PROPOFOL N/A 01/25/2018   Procedure: COLONOSCOPY WITH PROPOFOL;  Surgeon: Jerene Bears, MD;  Location: [facility];  Service: Gastroenterology;  Laterality: N/A;  . CORONARY STENT INTERVENTION N/A 02/22/2017   Procedure: CORONARY STENT INTERVENTION;  Surgeon: Nigel Mormon, MD;  Location: [facility];  Service: Cardiovascular;  Laterality: N/A;  . ESOPHAGOGASTRODUODENOSCOPY (EGD) WITH PROPOFOL N/A 01/25/2018   Procedure: ESOPHAGOGASTRODUODENOSCOPY (EGD) WITH PROPOFOL;  Surgeon: Jerene Bears, MD;  Location: [facility];  Service: Gastroenterology;  Laterality:  N/A;  . ESOPHAGOGASTRODUODENOSCOPY (EGD) WITH PROPOFOL N/A 04/10/2019   Procedure: ESOPHAGOGASTRODUODENOSCOPY (EGD) WITH PROPOFOL;  Surgeon: Jerene Bears, MD;  [facility];  Service: Gastroenterology;  Laterality: N/A;  . FLEXIBLE SIGMOIDOSCOPY N/A 07/15/2017   Procedure: FLEXIBLE SIGMOIDOSCOPY;  Surgeon: Carol Ada, MD;  Location: [facility];  Service: Endoscopy;  Laterality: N/A;  . HEMORRHOID BANDING    . I & D EXTREMITY Left 06/01/2017   Procedure: IRRIGATION AND DEBRIDEMENT LEFT ARM HEMATOMA WITH LIGATION OF LEFT ARM AV FISTULA;  Surgeon: Elam Dutch, MD;  Location: [facility];  Service: Vascular;  Laterality: Left;  . I & D EXTREMITY Left 06/14/2017   Procedure: IRRIGATION AND DEBRIDEMENT EXTREMITY;  Surgeon: Katha Cabal, MD;  Location: [facility];  Service: Vascular;  Laterality: Left;  .  INSERTION OF DIALYSIS CATHETER  05/30/2017  . INSERTION OF DIALYSIS CATHETER N/A 05/30/2017   Procedure: INSERTION OF DIALYSIS CATHETER;  Surgeon: Elam Dutch, MD;  Location: [facility];  Service: Vascular;  Laterality: N/A;  . IR PARACENTESIS  08/30/2017  . IR PARACENTESIS  09/29/2017  . IR PARACENTESIS  10/28/2017  . IR PARACENTESIS  11/09/2017  . IR PARACENTESIS  11/16/2017  . IR PARACENTESIS  11/28/2017  . IR PARACENTESIS  12/01/2017  . IR PARACENTESIS  12/06/2017  . IR PARACENTESIS  01/03/2018  . IR PARACENTESIS  01/23/2018  . IR PARACENTESIS  02/07/2018  . IR PARACENTESIS  02/21/2018  . IR PARACENTESIS  03/06/2018  . IR PARACENTESIS  03/17/2018  . IR PARACENTESIS  04/04/2018  . IR PARACENTESIS  12/28/2018  . IR PARACENTESIS  01/08/2019  . IR PARACENTESIS  01/23/2019  . IR PARACENTESIS  02/01/2019  . IR PARACENTESIS  02/19/2019  . IR PARACENTESIS  03/01/2019  . IR PARACENTESIS  03/15/2019  . IR PARACENTESIS  04/03/2019  . IR PARACENTESIS  04/12/2019  . IR PARACENTESIS  05/01/2019  . IR PARACENTESIS  05/08/2019  . IR PARACENTESIS  05/24/2019  . IR PARACENTESIS  06/12/2019  . IR PARACENTESIS  07/09/2019  . IR PARACENTESIS  07/27/2019  . IR PARACENTESIS  08/09/2019  . IR PARACENTESIS  08/21/2019  . IR PARACENTESIS  09/17/2019  . IR PARACENTESIS  10/05/2019  . IR PARACENTESIS  10/29/2019  . IR PARACENTESIS  11/08/2019  . IR PARACENTESIS  12/12/2019  . IR PARACENTESIS  01/03/2020  . IR PARACENTESIS  01/10/2020  . IR PARACENTESIS  01/17/2020  . IR PARACENTESIS  01/24/2020  . IR PARACENTESIS  01/31/2020  . IR PARACENTESIS  02/07/2020  . IR PARACENTESIS  02/21/2020  . IR PARACENTESIS  05/21/2020  . IR RADIOLOGIST EVAL & MGMT  02/14/2018  . IR RADIOLOGIST EVAL & MGMT  02/22/2019  . LEFT HEART CATH AND CORONARY ANGIOGRAPHY N/A 02/22/2017   Procedure: LEFT HEART CATH AND CORONARY ANGIOGRAPHY;  Surgeon: Nigel Mormon, MD;  Location: [facility];  Service: Cardiovascular;  Laterality: N/A;  . LIGATION OF  ARTERIOVENOUS  FISTULA Left 09/17/9371   Procedure: Plication of Left Arm Arteriovenous Fistula;  Surgeon: Elam Dutch, MD;  Location: [facility];  Service: Vascular;  Laterality: Left;  . POLYPECTOMY    . POLYPECTOMY  01/25/2018   Procedure: POLYPECTOMY;  Surgeon: Jerene Bears, MD;  Location: [facility];  Service: Gastroenterology;;  . REVISON OF ARTERIOVENOUS FISTULA Left 11/07/7679   Procedure: PLICATION OF DISTAL ANEURYSMAL SEGEMENT OF LEFT UPPER ARM ARTERIOVENOUS FISTULA;  Surgeon: Elam Dutch, MD;  Location: [facility];  Service: Vascular;  Laterality: Left;  .  REVISON OF ARTERIOVENOUS FISTULA Left 07/20/3233   Procedure: Plication of Left Upper Arm Fistula ;  Surgeon: Waynetta Sandy, MD;  Location: [facility];  Service: Vascular;  Laterality: Left;  . SKIN GRAFT SPLIT THICKNESS LEG / FOOT Left    SKIN GRAFT SPLIT THICKNESS LEFT ARM DONOR SITE: LEFT ANTERIOR THIGH  . SKIN SPLIT GRAFT Left 07/04/2017   Procedure: SKIN GRAFT SPLIT THICKNESS LEFT ARM DONOR SITE: LEFT ANTERIOR THIGH;  Surgeon: Elam Dutch, MD;  Location: [facility];  Service: Vascular;  Laterality: Left;  . THROMBECTOMY W/ EMBOLECTOMY Left 06/05/2017   Procedure: EXPLORATION OF LEFT ARM FOR BLEEDING; OVERSEWED PROXIMAL FISTULA;  Surgeon: Angelia Mould, MD;  Location: [facility];  Service: Vascular;  Laterality: Left;  . WOUND EXPLORATION Left 06/03/2017   Procedure: WOUND EXPLORATION WITH WOUND VAC APPLICATION TO LEFT ARM;  Surgeon: Angelia Mould, MD;  Location: [facility] OR;  Service: Vascular;  Laterality: Left;       Family History  Problem Relation Age of Onset  . Heart disease Mother   . Lung cancer Mother   . Heart disease Father   . Malignant hyperthermia Father   . COPD Father   . Throat cancer Sister   . Esophageal cancer Sister   . Hypertension Other   . COPD Other   . Colon cancer Neg Hx   . Colon polyps Neg Hx   . Rectal cancer Neg Hx   . Stomach cancer Neg Hx     Social History    Tobacco Use  . Smoking status: Current Every Day Smoker    Packs/day: 0.50    Years: 43.00    Pack years: 21.50    Types: Cigarettes    Start date: 08/13/1973  . Smokeless tobacco: Never Used  Vaping Use  . Vaping Use: Never used  Substance Use Topics  . Alcohol use: Not Currently    Comment: quit drinking in 2017  . Drug use: Not Currently    Types: Marijuana, Cocaine    Comment: reports using once every 3 months,  Quit 04-06-2019    Home Medications Prior to Admission medications   Medication Sig Start Date End Date Taking? Authorizing Provider  albuterol (VENTOLIN HFA) 108 (90 Base) MCG/ACT inhaler Inhale 2 puffs into the lungs every 4 (four) hours as needed for wheezing or shortness of breath.  02/03/20   [provider]  amiodarone (PACERONE) 200 MG tablet Take 1 tablet (200 mg total) by mouth daily. 01/29/20   Shelly Coss, MD  cefdinir (OMNICEF) 300 MG capsule Take 1 capsule (300 mg total) by mouth every Monday, Wednesday, and Friday at 6 PM. Patient not taking: Reported on 06/26/2020 05/23/20   Delora Fuel, MD  diltiazem (CARDIZEM CD) 120 MG 24 hr capsule TAKE 1 CAPSULE BY MOUTH EVERY DAY 06/11/20   Adrian Prows, MD  diphenhydrAMINE (BENADRYL) 25 mg capsule Take 1 capsule (25 mg total) by mouth every 6 (six) hours as needed for itching or allergies. 01/29/20   Shelly Coss, MD  gabapentin (NEURONTIN) 300 MG capsule Take 1 capsule (300 mg total) by mouth in the morning and at bedtime. 06/26/20   Charlott Rakes, MD  metoprolol succinate (TOPROL XL) 25 MG 24 hr tablet Take 1 tablet (25 mg total) by mouth daily. 05/01/20   Adrian Prows, MD  ondansetron (ZOFRAN) 4 MG tablet TAKE 1 TABLET BY MOUTH EVERY 8 HOURS AS NEEDED FOR NAUSEA AND VOMITING 06/26/20   Charlott Rakes, MD  Oxycodone HCl 10  MG TABS Take 10 mg by mouth 3 (three) times daily as needed (pain).  01/24/20   [provider]  pantoprazole (PROTONIX) 40 MG tablet Take 1 tablet (40 mg total) by mouth 2  (two) times daily. 03/25/20   Cantwell, Celeste C, PA-C  sevelamer carbonate (RENVELA) 800 MG tablet Take 2,400 mg by mouth See admin instructions. Take 3 tablets (2400 mg) by mouth up to three times daily with meals    [provider]  tenofovir (VIREAD) 300 MG tablet Take 1 by mouth every Monday. 05/22/20   Maness, Arnette Norris, MD    Allergies    Tramadol, Grass extracts [gramineae pollens], Morphine and related, Pollen extract, Acetaminophen, Aspirin, and Clonidine derivatives  Review of Systems   Review of Systems  Physical Exam Updated Vital Signs BP (!) 155/143 (BP Location: Right Arm)   Pulse (!) 120   Temp 100.2 F (37.9 C) (Temporal)   Resp 16   SpO2 97%   Physical Exam Vitals and nursing note reviewed.  Constitutional:      General: He is in acute distress.     Appearance: He is well-developed. He is ill-appearing.  HENT:     Mouth/Throat:     Mouth: Mucous membranes are moist.  Eyes:     Pupils: Pupils are equal, round, and reactive to light.  Cardiovascular:     Rate and Rhythm: Tachycardia present. Rhythm irregular.  Pulmonary:     Effort: Tachypnea present.     Breath sounds: Examination of the left-lower field reveals decreased breath sounds. Decreased breath sounds present. No wheezing, rhonchi or rales.  Abdominal:     General: Bowel sounds are normal.     Tenderness: There is abdominal tenderness.     Comments: Diffuse abdominal distension, mild diffuse ttp  Musculoskeletal:        General: Normal range of motion.     Cervical back: Normal range of motion.     Comments: Rue dialysis site Old acces lue  Skin:    General: Skin is warm and dry.     Capillary Refill: Capillary refill takes less than 2 seconds.  Neurological:     General: No focal deficit present.     Mental Status: He is alert.  Psychiatric:        Mood and Affect: Mood normal.     ED Results / Procedures / Treatments   Labs (all labs ordered are listed, but only abnormal  results are displayed) Labs Reviewed  I-STAT CHEM 8, ED  TROPONIN I (HIGH SENSITIVITY)    EKG EKG Interpretation  Date/Time:  Monday June 30 2020 17:10:05 EST Ventricular Rate:  147 PR Interval:    QRS Duration: 163 QT Interval:  351 QTC Calculation: 549 R Axis:   -108 Text Interpretation: Atrial fibrillation with rapid ventricular response Sinus tachycardia with irregular rate Nonspecific IVCD with LAD Consider left ventricular hypertrophy Abnormal T, probable ischemia, lateral leads Confirmed by Pattricia Boss [phone]) on 06/30/2020 5:20:54 PM   Radiology DG Chest Port 1 View  Result Date: 06/30/2020 CLINICAL DATA:  59 year old male with shortness of breath and chest pressure. EXAM: PORTABLE CHEST 1 VIEW COMPARISON:  Chest radiograph dated 06/16/2020. FINDINGS: Mild eventration of the left hemidiaphragm. There are bibasilar densities, likely atelectasis. No focal consolidation, pleural effusion, pneumothorax. There is mild cardiomegaly. Atherosclerotic calcification of the aorta. No acute osseous pathology. IMPRESSION: 1. No acute cardiopulmonary process. 2. Mild cardiomegaly. Electronically Signed   By: Anner Crete M.D.   On: 06/30/2020  17:49    Procedures .Critical Care Performed by: Pattricia Boss, MD Authorized by: Pattricia Boss, MD   Critical care provider statement:    Critical care time (minutes):  75   Critical care was necessary to treat or prevent imminent or life-threatening deterioration of the following conditions:  Respiratory failure, circulatory failure and metabolic crisis   Critical care was time spent personally by me on the following activities:  Discussions with consultants, evaluation of patient's response to treatment, examination of patient, ordering and performing treatments and interventions, ordering and review of laboratory studies, ordering and review of radiographic studies, pulse oximetry, re-evaluation of patient's condition, obtaining history  from patient or surrogate and review of old charts   (including critical care time)  Medications Ordered in ED Medications - No data to display  ED Course  I have reviewed the triage vital signs and the nursing notes.  Pertinent labs & imaging results that were available during my care of the patient were reviewed by me and considered in my medical decision making (see chart for details).  Clinical Course as of 06/30/20 2016  Mon Jun 30, 2020  1821 Elevated potassium noted- insulin/dextrose, calcium ordered [DR]  1826 DG Chest Port 1 View CXR reviewed- left base not well visualized ow agree with radiology read [DR]  [id number] Patient now noted to have temp to 100.2, covid ordered, will add bl cx HR decreased with cardizem and now borderline hypotensive at 92/49  [DR]  1838 Patient resting comfortably hr 90s still a fib Insulin has been given Plan recheck temp and ekg  [DR]  1910 Covid negative Care discusse with nephrology  [DR]  1910 EKG 12-Lead [DR]  1910 Troponin at 37 with [DR]  2015 Troponin 37 prior being in the 90s. [DR]    Clinical Course User Index [DR] Pattricia Boss, MD   MDM Rules/Calculators/A&P                          Discussed ekg emergently with Drs. Ellyn Hack and Einar Gip- Dr. Einar Gip is primary cardiologist and knows patient well.  Diffuse stt changes noted but given history and known multiple episodes of hyperkalemia as well as current elevated rate,no stemi call indicated.  Patient has h/o of afib rvr but is not a candidate for surgical intervention due to his noncompliance and multiple health problems. Awaiting potassium and will give meds if elevated- will not delay if blood not able to be drawn emergently- plan istat for rapid tot. Will dose with cardizem to rate control. Patient is not anticoagulated and candidate for semi-elective cardioversion but would consider if other interventions not succesful. Nephrology- discussed with nephrology for dialysis [facility]  admission page for admission-discussed with Dr. Tonie Griffith and will see for admission  Final Clinical Impression(s) / ED Diagnoses Final diagnoses:  Paroxysmal A-fib ([place])  Hyperkalemia  ESRD (end stage renal disease) [facility] 2)    Rx / DC Orders ED Discharge Orders    None       Pattricia Boss, MD 06/30/20 2030    Pattricia Boss, MD 06/30/20 2030

## 2020-06-30 NOTE — ED Notes (Signed)
Pt transported to Dialysis ?

## 2020-07-01 ENCOUNTER — Ambulatory Visit: Payer: Medicare Other | Admitting: Student

## 2020-07-01 LAB — I-STAT CHEM 8, ED
BUN: 67 mg/dL — ABNORMAL HIGH (ref 6–20)
Calcium, Ion: 1.16 mmol/L (ref 1.15–1.40)
Chloride: 95 mmol/L — ABNORMAL LOW (ref 98–111)
Creatinine, Ser: 9.6 mg/dL — ABNORMAL HIGH (ref 0.61–1.24)
Glucose, Bld: 81 mg/dL (ref 70–99)
HCT: 36 % — ABNORMAL LOW (ref 39.0–52.0)
Hemoglobin: 12.2 g/dL — ABNORMAL LOW (ref 13.0–17.0)
Potassium: 7.2 mmol/L (ref 3.5–5.1)
Sodium: 129 mmol/L — ABNORMAL LOW (ref 135–145)
TCO2: 28 mmol/L (ref 22–32)

## 2020-07-02 IMAGING — CR CHEST - 2 VIEW
2 series · 2 of 2 positions shown · non-contrast
Comparison: 12/27/2018

CLINICAL DATA: Shortness of breath. Missed dialysis.

EXAM:
CHEST - 2 VIEW

[chest lat]
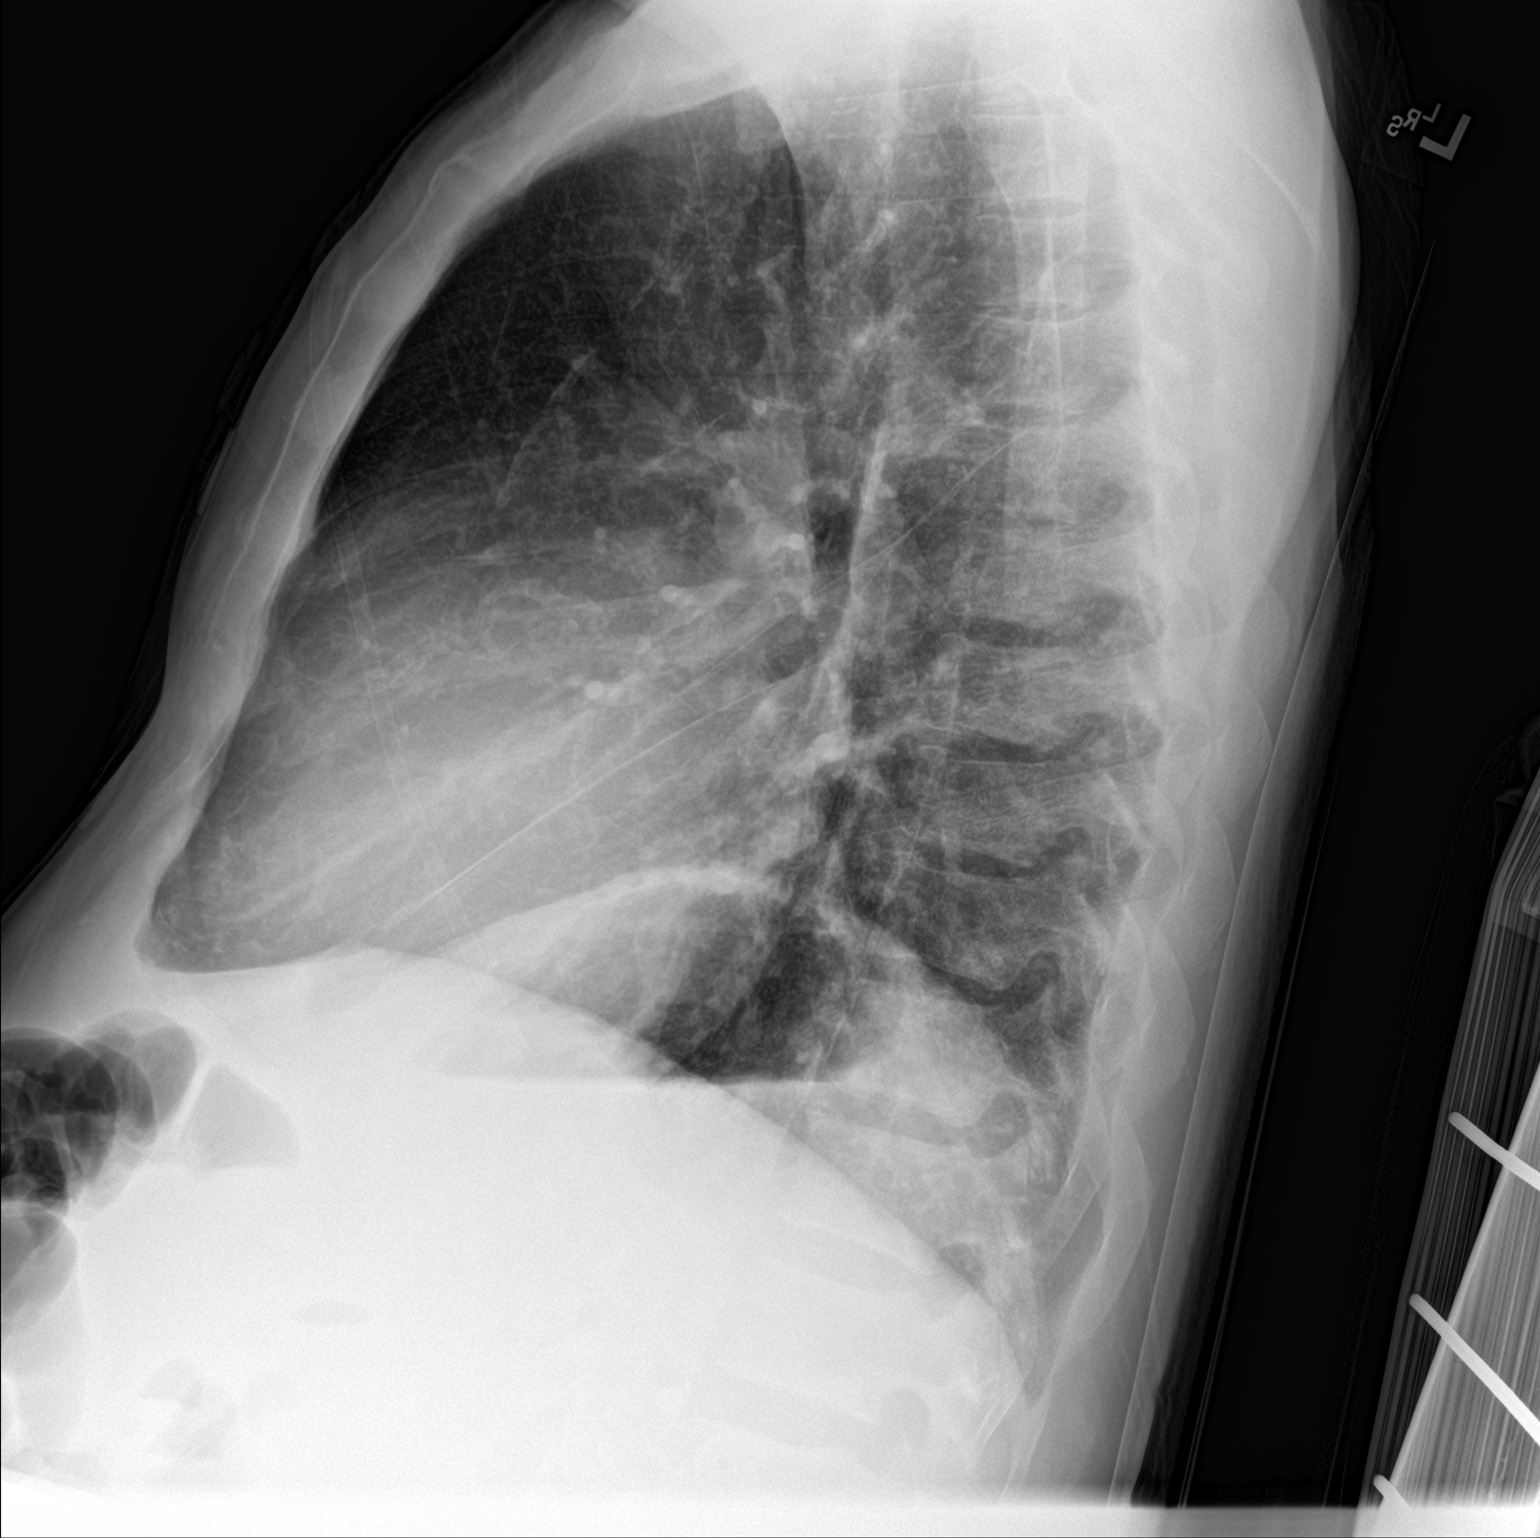

[chest ap]
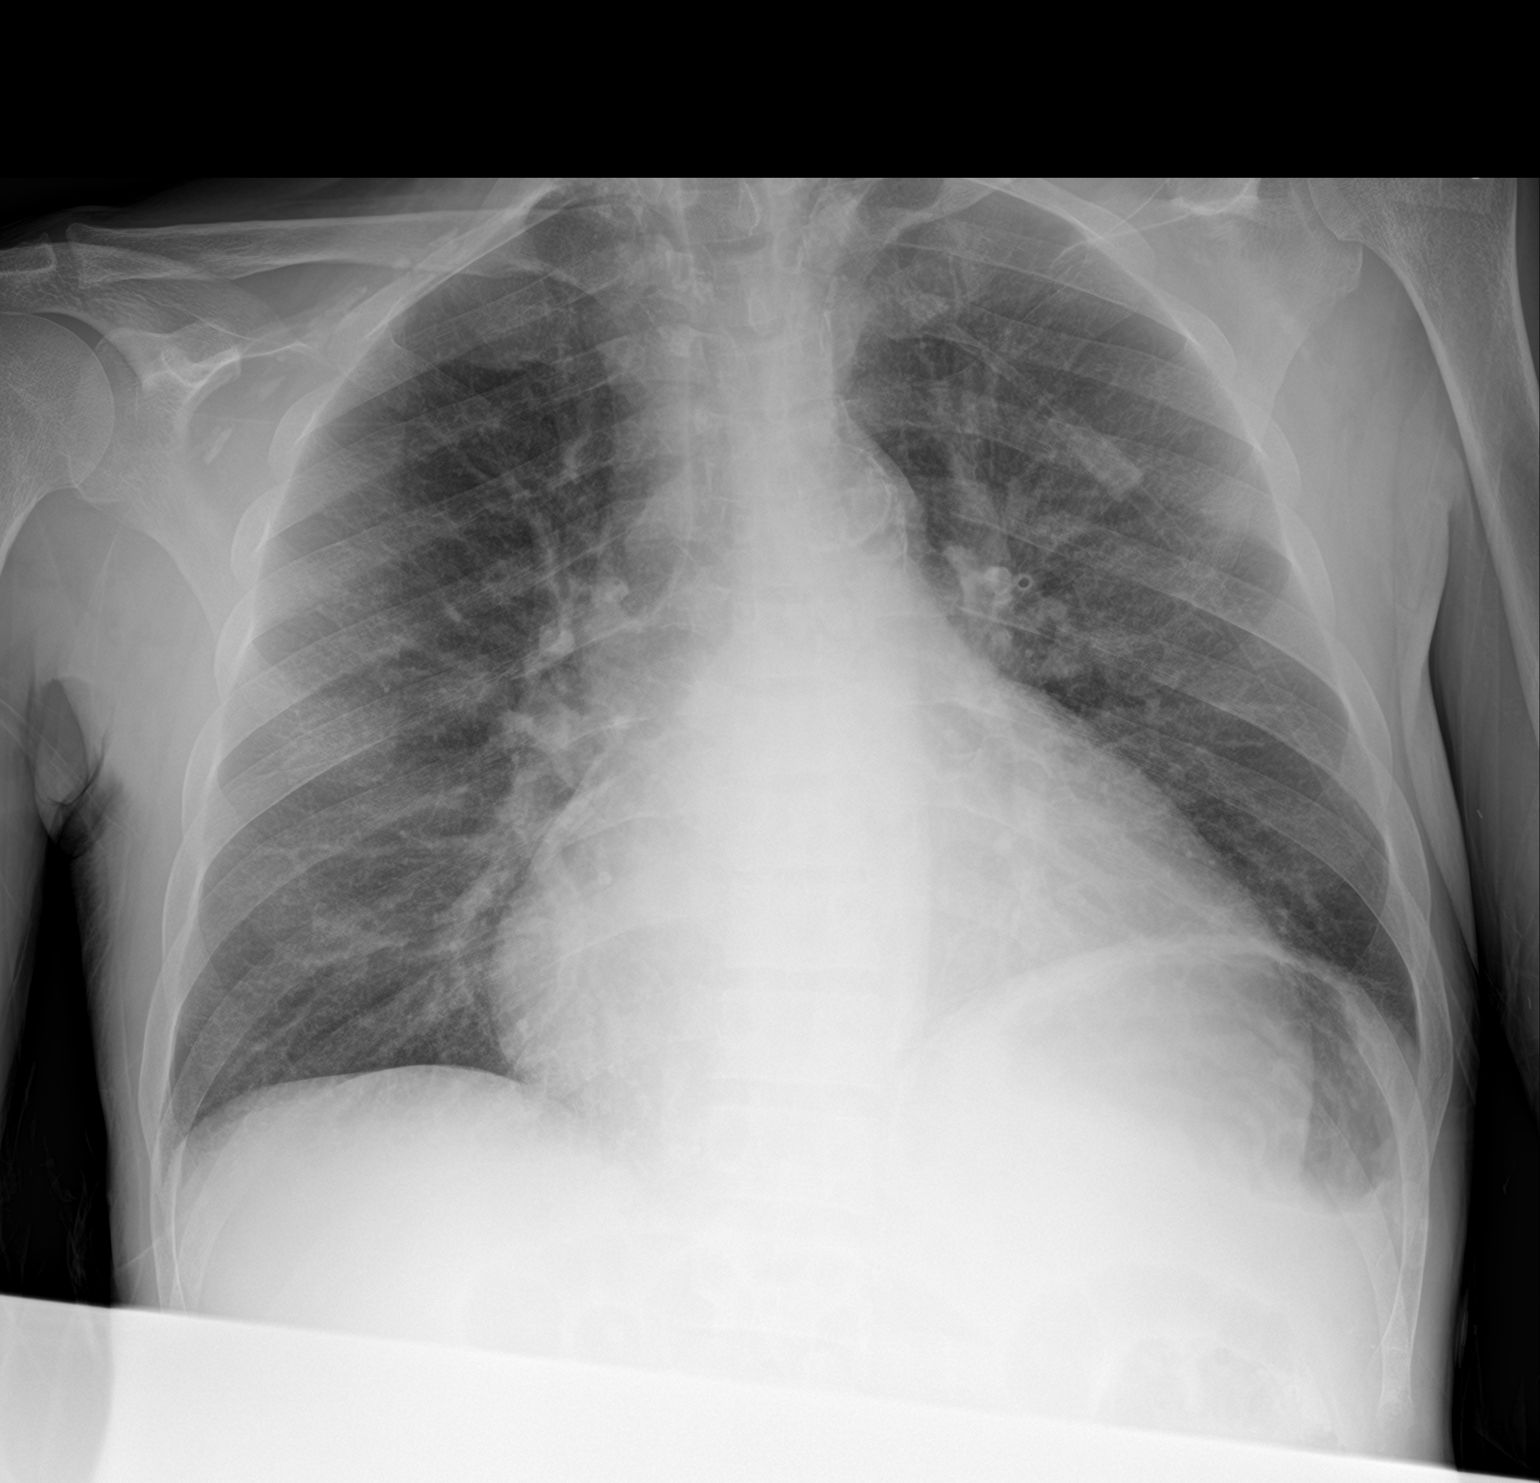

[2 of 2 positions shown; findings below may reference images not displayed]

FINDINGS: Unchanged cardiomegaly. Unchanged mediastinal contours. Vascular
congestion with mild peribronchial thickening. No focal airspace
disease or pleural effusion. No acute osseous abnormality.
IMPRESSION: Cardiomegaly with vascular congestion. Mild peribronchial thickening
may be pulmonary edema or bronchitis.

## 2020-07-07 IMAGING — DX DG ABDOMEN ACUTE W/ 1V CHEST
4 series · 4 of 4 positions shown · non-contrast
Comparison: Chest radiograph January 01, 2019;

CLINICAL DATA: Abdominal pain

EXAM:
DG ABDOMEN ACUTE W/ 1V CHEST

[abdomen supine]
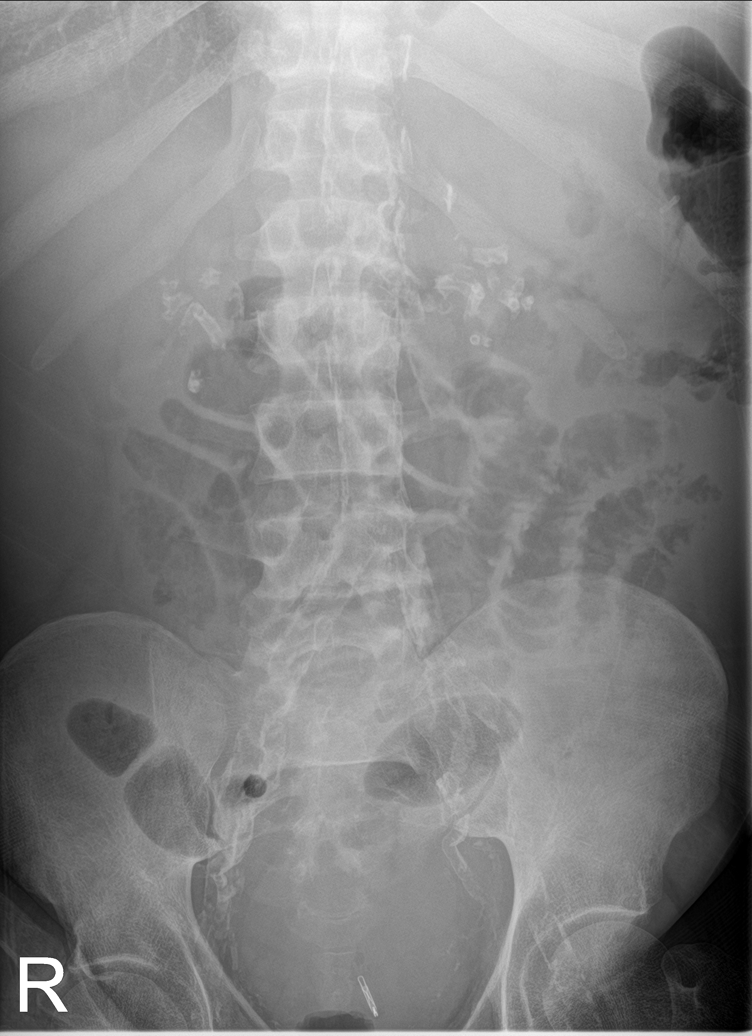

[abdomen decu]
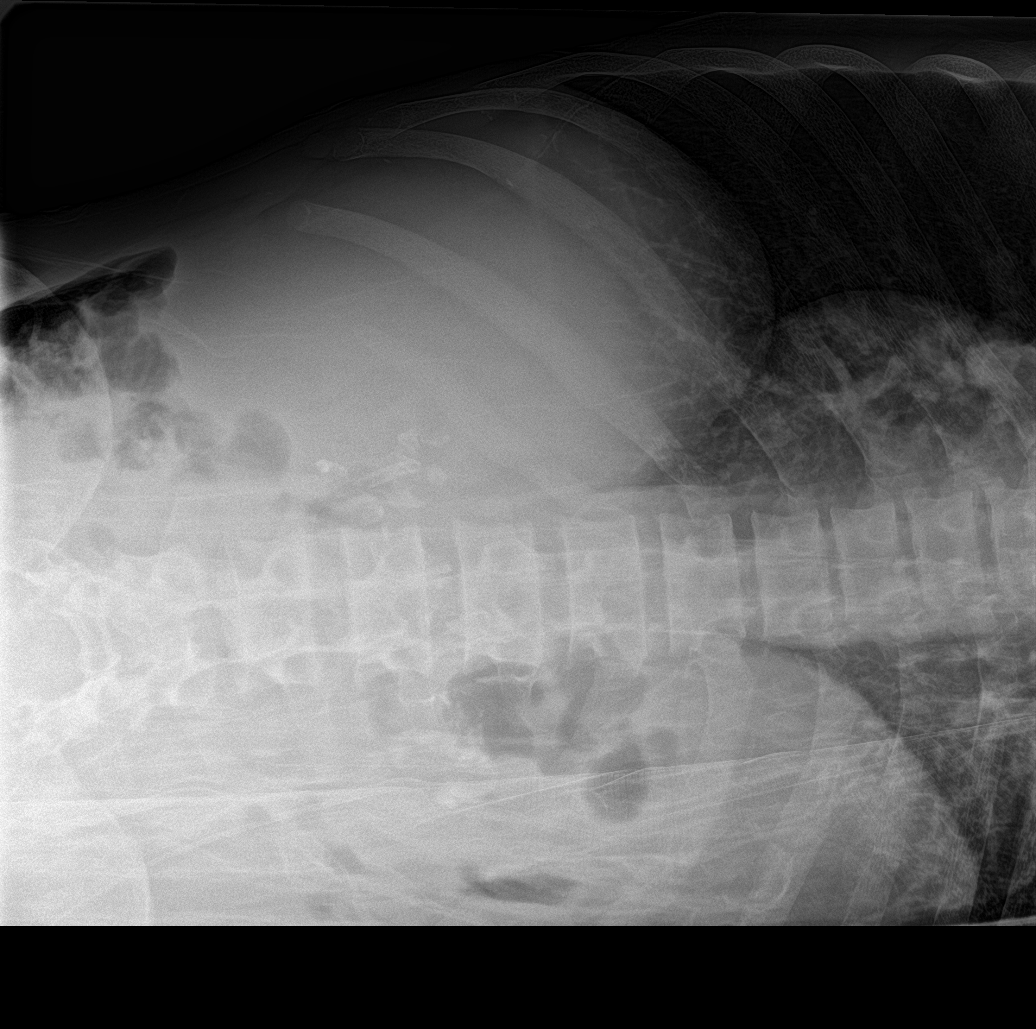

[chest ap (1 of 2)]
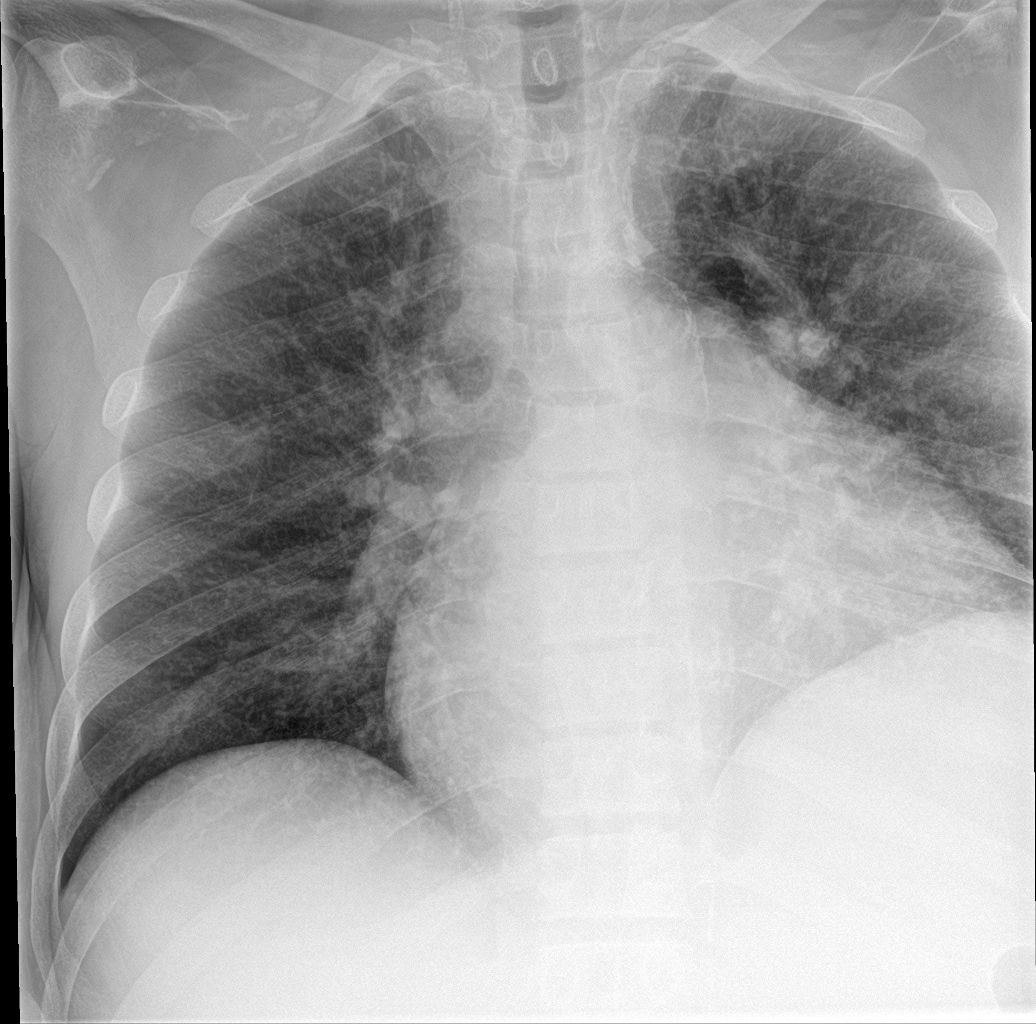

[chest ap (2 of 2)]
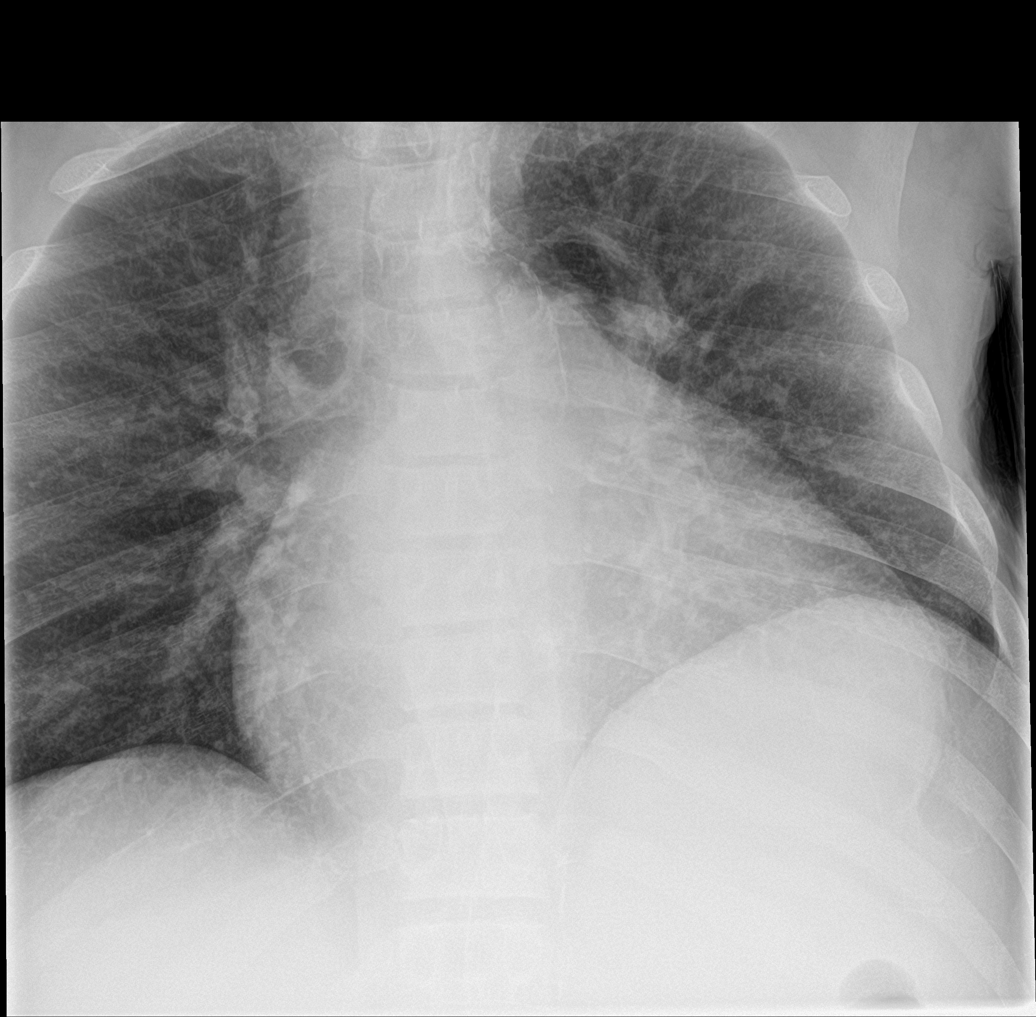

[4 of 4 positions shown; findings below may reference images not displayed]

CT abdomen and pelvis
December 17, 2018; prior chest radiograph September 12, 2018
FINDINGS: AP chest: There is cardiomegaly with pulmonary vascularity within
normal limits. There is diffuse interstitial thickening, suspicious
for chronic interstitial pulmonary edema. No frank consolidation. No
appreciable pleural effusion. No adenopathy. There is aortic
atherosclerosis. There are foci of calcification in the subclavian
arteries bilaterally.

Supine and left lateral decubitus abdomen images: There is no bowel
dilatation or air-fluid level to suggest bowel obstruction. No free
air. There is extensive aortic and iliac artery atherosclerosis as
well as major mesenteric arterial vascular calcifications.
IMPRESSION: No bowel obstruction or free air.

Cardiomegaly with diffuse interstitial prominence which may
represent a degree of pulmonary edema. No consolidation, however.

There is extensive arterial vascular calcification throughout the
aorta, major mesenteric vessels, as well as major pelvic arterial
vessels.

## 2020-07-09 ENCOUNTER — Telehealth: Payer: Self-pay | Admitting: Family Medicine

## 2020-07-09 NOTE — Telephone Encounter (Signed)
Requested medication (s) are due for refill today  Yes   Last ordered by historical provider  Requested medication (s) are on the active medication list Yes  Future visit scheduled Yes on 09/02/20.   Note to clinic-Routing to pcp for consideration.   Requested Prescriptions  Pending Prescriptions Disp Refills   albuterol (VENTOLIN HFA) 108 (90 Base) MCG/ACT inhaler      Sig: Inhale 2 puffs into the lungs every 4 (four) hours as needed for wheezing or shortness of breath.      Pulmonology:  Beta Agonists Failed - 07/09/2020 11:35 AM      Failed - One inhaler should last at least one month. If the patient is requesting refills earlier, contact the patient to check for uncontrolled symptoms.      Passed - Valid encounter within last 12 months    Recent Outpatient Visits           1 week ago AF (paroxysmal atrial fibrillation) ([place])   [place], MD       Future Appointments             In 1 month Charlott Rakes, MD [place]   In 2 months Charlott Rakes, MD [place]

## 2020-07-09 NOTE — Telephone Encounter (Signed)
Copied from [place] [phone]. Topic: Quick Communication - Rx Refill/Question >> Jul 09, 2020 10:26 AM Leward Quan A wrote: Medication: albuterol (VENTOLIN HFA) 108 (90 Base) MCG/ACT inhaler 2 puff   Has the patient contacted their pharmacy? Yes.   (Agent: If no, request that the patient contact the pharmacy for the refill.) (Agent: If yes, when and what did the pharmacy advise?)  Preferred Pharmacy (with phone number or street name): [id number] - [place], [place]  Phone:  [phone] Fax:  [phone]     Agent: Please be advised that RX refills may take up to 3 business days. We ask that you follow-up with your pharmacy.

## 2020-07-10 MED ORDER — ALBUTEROL SULFATE HFA 108 (90 BASE) MCG/ACT IN AERS: 2.0000 | INHALATION_SPRAY | RESPIRATORY_TRACT | 2 refills | Status: DC | PRN

## 2020-07-10 NOTE — Telephone Encounter (Signed)
Pt is irate that he is not having his inhalers refilled and wants to discuss with his nurse. Pls FU at [phone]

## 2020-07-15 ENCOUNTER — Telehealth: Payer: Self-pay | Admitting: Family Medicine

## 2020-07-15 NOTE — Telephone Encounter (Signed)
Pt is requesting abx for fungus under his nails, there is no history of thins in his chart.

## 2020-07-15 NOTE — Telephone Encounter (Signed)
He needs an office visit with any available Clinician to discuss this as this is a new issue not previously discussed.

## 2020-07-15 NOTE — Telephone Encounter (Signed)
Pt is calling back checking on the status of abx

## 2020-07-15 NOTE — Telephone Encounter (Signed)
Pt called stating that he is needing to have an antibiotic to help deal with the fungus under his finger nails. Pt states that he could not wait until next appt due to the pain. Please advise.     [id number] [place], [place]  [address] [place] [address]  Phone: [phone] Fax: [phone]  Hours: Open 24 hours

## 2020-07-15 NOTE — Telephone Encounter (Signed)
Pharmacy was called and they state that pt picked up medication on 07/10/20

## 2020-07-16 ENCOUNTER — Ambulatory Visit: Payer: Medicare Other | Attending: Family Medicine | Admitting: Family Medicine

## 2020-07-16 ENCOUNTER — Other Ambulatory Visit: Payer: Self-pay

## 2020-07-16 DIAGNOSIS — R310 Gross hematuria: Secondary | ICD-10-CM | POA: Diagnosis not present

## 2020-07-16 DIAGNOSIS — B351 Tinea unguium: Secondary | ICD-10-CM | POA: Diagnosis not present

## 2020-07-16 MED ORDER — CICLOPIROX 8 % EX SOLN
Freq: Every day | CUTANEOUS | 2 refills | Status: DC
Start: 2020-07-16 — End: 2020-09-08

## 2020-07-16 NOTE — Progress Notes (Signed)
States that he has fungus under his nails.

## 2020-07-16 NOTE — Telephone Encounter (Signed)
Patient is calling again to speak with the nurse regarding some medication that the doctor is supposed to speak with him about.  Please call patient to update him asap.  CB# [phone]

## 2020-07-16 NOTE — Progress Notes (Signed)
Virtual Visit via Telephone Note  I connected with Saed Hudlow, on 07/16/2020 at 10:17 AM by telephone due to the COVID-19 pandemic and verified that I am speaking with the correct person using two identifiers.   Consent: I discussed the limitations, risks, security and privacy concerns of performing an evaluation and management service by telephone and the availability of in person appointments. I also discussed with the patient that there may be a patient responsible charge related to this service. The patient expressed understanding and agreed to proceed.   Location of Patient: Out and about  Location of Provider: Home   Persons participating in Telemedicine visit: Tyri Elmore Farrington-CMA Dr. 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 He complains of pain in his nails and this was evaluated by his pain specialist and diagnosed as a fungus And he was told 'he would need an internal medication'. His R index and middle finger are affected. He has a black streak running down his nail but no erythema of his fingers but the nails are painful.  He complains of hematuria which he noticed yesterday. He has no flank pain, dysuria, fever, nausea or vomiting.   Past Medical History:  Diagnosis Date  . Anemia   . Anxiety   . Arthritis    left shoulder  . Atherosclerosis of aorta ([place])   . Cardiomegaly   . Chest pain    DATE UNKNOWN, C/O PERIODICALLY  . Cocaine abuse ([place])   . COPD exacerbation ([place]) 08/17/2016  . Coronary artery disease    stent 02/22/17  . Dysrhythmia   . ESRD (end stage renal disease) on dialysis ([place])    "[name]; MWF" (07/04/2017)  . GERD (gastroesophageal reflux disease)    DATE  UNKNOWN  . Hemorrhoids   . Hepatitis B, chronic ([place])   . Hepatitis C   . History of kidney stones   . Hyperkalemia   . Hypertension   . Kidney failure   . Metabolic bone disease    Patient denies  . Mitral stenosis   . Myocardial infarction ([place])   . Pneumonia   . Pulmonary edema   . Solitary rectal ulcer syndrome 07/2017   at [facility] for rectal bleeding  . Tubular adenoma of colon    Allergies  Allergen Reactions  . Tramadol Itching and Other (See Comments)    Other reaction(s): Unknown  . Grass Extracts [Gramineae Pollens]     Other reaction(s): Sneezing  . Morphine And Related Other (See Comments)    Stomach pain  . Pollen Extract Other (See Comments)    Other reaction(s): Sneezing (finding)  . Acetaminophen Nausea Only    Stomach ache   . Aspirin Itching and Other (See Comments)    STOMACH PAIN  Other reaction(s): Unknown  . Clonidine Derivatives Itching    Current Outpatient Medications on File Prior to Visit  Medication Sig Dispense Refill  . albuterol (VENTOLIN HFA) 108 (90 Base) MCG/ACT inhaler Inhale 2 puffs into the lungs every 4 (four) hours as needed for wheezing or shortness of breath. 8.5 g 2  . amiodarone (PACERONE) 200 MG tablet Take 1 tablet (200 mg total) by mouth daily. 30 tablet 1  . cefdinir (OMNICEF) 300 MG capsule Take 1 capsule (300 mg  total) by mouth every Monday, Wednesday, and Friday at 6 PM. 24 capsule 0  . diltiazem (CARDIZEM CD) 120 MG 24 hr capsule TAKE 1 CAPSULE BY MOUTH EVERY DAY 90 capsule 0  . diphenhydrAMINE (BENADRYL) 25 mg capsule Take 1 capsule (25 mg total) by mouth every 6 (six) hours as needed for itching or allergies. 30 capsule 0  . gabapentin (NEURONTIN) 300 MG capsule Take 1 capsule (300 mg total) by mouth in the morning and at bedtime. 60 capsule 3  . metoprolol succinate (TOPROL XL) 25 MG 24 hr tablet Take 1 tablet (25 mg total) by mouth daily. 30 tablet 2  . ondansetron (ZOFRAN) 4 MG tablet TAKE 1 TABLET BY MOUTH EVERY  8 HOURS AS NEEDED FOR NAUSEA AND VOMITING 30 tablet 3  . Oxycodone HCl 10 MG TABS Take 10 mg by mouth 3 (three) times daily as needed (pain).     . pantoprazole (PROTONIX) 40 MG tablet Take 1 tablet (40 mg total) by mouth 2 (two) times daily. 60 tablet 0  . sevelamer carbonate (RENVELA) 800 MG tablet Take 2,400 mg by mouth See admin instructions. Take 3 tablets (2400 mg) by mouth up to three times daily with meals    . tenofovir (VIREAD) 300 MG tablet Take 1 by mouth every Monday. 12 tablet 0   No current facility-administered medications on file prior to visit.    Observations/Objective: Awake, alert, oriented x3 Not in acute distress  Assessment and Plan: 1. Gross hematuria Will evaluate for renal calculi - CT RENAL STONE STUDY; Future  2. Onychomycosis Cirrhosis precludes prescription of an oral antifungal which would be more effective Will place on topical medication - ciclopirox (PENLAC) 8 % solution; Apply topically at bedtime. For 8 weeks  Dispense: 6.6 mL; Refill: 2   Follow Up Instructions: Keep previously scheduled appointment   I discussed the assessment and treatment plan with the patient. The patient was provided an opportunity to ask questions and all were answered. The patient agreed with the plan and demonstrated an understanding of the instructions.   The patient was advised to call back or seek an in-person evaluation if the symptoms worsen or if the condition fails to improve as anticipated.     I provided 11 minutes total of non-face-to-face time during this encounter including median intraservice time, reviewing previous notes, investigations, ordering medications, medical decision making, coordinating care and patient verbalized understanding at the end of the visit.     Charlott Rakes, MD, FAAFP. [facility] [place], [place]   07/16/2020, 10:17 AM

## 2020-07-16 NOTE — Telephone Encounter (Signed)
Attempt x 2 to return call to patient regarding medication concerns. LVM

## 2020-07-16 NOTE — Telephone Encounter (Signed)
Pt had a virtual visit with [place] today.

## 2020-07-21 NOTE — Progress Notes (Signed)
Primary Physician/Referring:  Charlott Rakes, MD  Patient ID: [name], male    DOB: 05-24-1961, 60 y.o.   MRN: [id number]  Chief Complaint  Patient presents with  . Atrial Fibrillation  . Congestive Heart Failure       . Follow-up    6 week   HPI:    [name]  is a 60 y.o. male  with very complex medical problems including severe COPD and chronic cor pulmonale with pulmonary hypertension, coronary artery disease s/p stenting to mid RCA in 2018, paroxysmal atrial fibrillation with RVR, hypertensive cardiomyopathy, dietary and medication noncompliance, end-stage renal disease on hemodialysis, chronic hepatitis B and C with cirrhosis of the liver and ascites, tobacco use, smokes 1/2 ppd, h/o marijuana use and prior cocaine use quit in 2017. Not on anticoagulation due to cirrhosis of liver and compliance with Warfarin.   He has had multiple ER visits for A fib with RVR, near syncope, shortness of breath, acute diastolic heart failure and hyperkalemia.  He has also been in the hospital with acute respiratory distress and pulmonary edema due to missed dialysis.    Patient was last seen in our office 05/02/2020 by Dr. Einar Gip he presents today for 6 week follow up of atrial fibrillation and hear failure. At last office visit switch from metoprolol tartrate to metoprolol succinate. Since his last office visit patient was hospitalized again 05/20/2020-05/22/2020 with atrial fibrillation with RVR, hyperkalemia, and dyspnea after missing dialysis. He again presented to [facility] ED on 06/30/2020 with dyspnea and chest pain, was treated for A fib with RVR and hyperkalemia.   Patient reports that since his last hospitalization he has been feeling well.  Currently denies chest pain, palpitations, dizziness, syncope, near syncope, orthopnea, PND, leg swelling.  Patient continues to report chronic shortness of breath, which is stable.  Patient is unclear if he is taking cardiac  medications as prescribed.  He states he is taking diltiazem, but is unsure if he is taking amiodarone and metoprolol succinate.  Of note since hospitalization patient has been adherent to his Monday/Wednesday/Friday dialysis schedule.   Past Medical History:  Diagnosis Date  . Anemia   . Anxiety   . Arthritis    left shoulder  . Atherosclerosis of aorta ([place])   . Cardiomegaly   . Chest pain    DATE UNKNOWN, C/O PERIODICALLY  . Cocaine abuse ([place])   . COPD exacerbation ([place]) 08/17/2016  . Coronary artery disease    stent 02/22/17  . Dysrhythmia   . ESRD (end stage renal disease) on dialysis ([place])    "[name]; MWF" (07/04/2017)  . GERD (gastroesophageal reflux disease)    DATE UNKNOWN  . Hemorrhoids   . Hepatitis B, chronic ([place])   . Hepatitis C   . History of kidney stones   . Hyperkalemia   . Hypertension   . Kidney failure   . Metabolic bone disease    Patient denies  . Mitral stenosis   . Myocardial infarction ([place])   . Pneumonia   . Pulmonary edema   . Solitary rectal ulcer syndrome 07/2017   at flex sig for rectal bleeding  . Tubular adenoma of colon    Past Surgical History:  Procedure Laterality Date  . A/V FISTULAGRAM Left 05/26/2017   Procedure: A/V FISTULAGRAM;  Surgeon: Conrad , MD;  Location: [place] [facility];  Service: Cardiovascular;  Laterality: Left;  . A/V FISTULAGRAM Right 11/18/2017   Procedure: A/V FISTULAGRAM - Right  Arm;  Surgeon: Elam Dutch, MD;  Location: [place] [facility];  Service: Cardiovascular;  Laterality: Right;  . APPLICATION OF WOUND VAC Left 06/14/2017   Procedure: APPLICATION OF WOUND VAC;  Surgeon: Katha Cabal, MD;  Location: [facility];  Service: Vascular;  Laterality: Left;  . AV FISTULA PLACEMENT  2012   BELIEVED WAS PLACED IN JUNE  . AV FISTULA PLACEMENT Right 08/09/2017   Procedure: Creation Right arm ARTERIOVENOUS BRACHIOCEPOHALIC FISTULA;  Surgeon: Elam Dutch, MD;  Location: [facility] OR;  Service:  Vascular;  Laterality: Right;  . AV FISTULA PLACEMENT Right 11/22/2017   Procedure: INSERTION OF ARTERIOVENOUS (AV) GORE-TEX GRAFT RIGHT UPPER ARM;  Surgeon: Elam Dutch, MD;  Location: [place];  Service: Vascular;  Laterality: Right;  . BIOPSY  01/25/2018   Procedure: BIOPSY;  Surgeon: Jerene Bears, MD;  Location: [place];  Service: Gastroenterology;;  . BIOPSY  04/10/2019   Procedure: BIOPSY;  Surgeon: Jerene Bears, MD;  Location: [facility];  Service: Gastroenterology;;  . COLONOSCOPY    . COLONOSCOPY WITH PROPOFOL N/A 01/25/2018   Procedure: COLONOSCOPY WITH PROPOFOL;  Surgeon: Jerene Bears, MD;  Location: [place];  Service: Gastroenterology;  Laterality: N/A;  . CORONARY STENT INTERVENTION N/A 02/22/2017   Procedure: CORONARY STENT INTERVENTION;  Surgeon: Nigel Mormon, MD;  Location: [place] [facility];  Service: Cardiovascular;  Laterality: N/A;  . ESOPHAGOGASTRODUODENOSCOPY (EGD) WITH PROPOFOL N/A 01/25/2018   Procedure: ESOPHAGOGASTRODUODENOSCOPY (EGD) WITH PROPOFOL;  Surgeon: Jerene Bears, MD;  Location: [place];  Service: Gastroenterology;  Laterality: N/A;  . ESOPHAGOGASTRODUODENOSCOPY (EGD) WITH PROPOFOL N/A 04/10/2019   Procedure: ESOPHAGOGASTRODUODENOSCOPY (EGD) WITH PROPOFOL;  Surgeon: Jerene Bears, MD;  Location: [facility];  Service: Gastroenterology;  Laterality: N/A;  . FLEXIBLE SIGMOIDOSCOPY N/A 07/15/2017   Procedure: FLEXIBLE SIGMOIDOSCOPY;  Surgeon: Carol Ada, MD;  Location: [place];  Service: Endoscopy;  Laterality: N/A;  . HEMORRHOID BANDING    . I & D EXTREMITY Left 06/01/2017   Procedure: IRRIGATION AND DEBRIDEMENT LEFT ARM HEMATOMA WITH LIGATION OF LEFT ARM AV FISTULA;  Surgeon: Elam Dutch, MD;  Location: [place];  Service: Vascular;  Laterality: Left;  . I & D EXTREMITY Left 06/14/2017   Procedure: IRRIGATION AND DEBRIDEMENT EXTREMITY;  Surgeon: Katha Cabal, MD;  Location: [facility];  Service: Vascular;  Laterality:  Left;  . INSERTION OF DIALYSIS CATHETER  05/30/2017  . INSERTION OF DIALYSIS CATHETER N/A 05/30/2017   Procedure: INSERTION OF DIALYSIS CATHETER;  Surgeon: Elam Dutch, MD;  Location: [place];  Service: Vascular;  Laterality: N/A;  . IR PARACENTESIS  08/30/2017  . IR PARACENTESIS  09/29/2017  . IR PARACENTESIS  10/28/2017  . IR PARACENTESIS  11/09/2017  . IR PARACENTESIS  11/16/2017  . IR PARACENTESIS  11/28/2017  . IR PARACENTESIS  12/01/2017  . IR PARACENTESIS  12/06/2017  . IR PARACENTESIS  01/03/2018  . IR PARACENTESIS  01/23/2018  . IR PARACENTESIS  02/07/2018  . IR PARACENTESIS  02/21/2018  . IR PARACENTESIS  03/06/2018  . IR PARACENTESIS  03/17/2018  . IR PARACENTESIS  04/04/2018  . IR PARACENTESIS  12/28/2018  . IR PARACENTESIS  01/08/2019  . IR PARACENTESIS  01/23/2019  . IR PARACENTESIS  02/01/2019  . IR PARACENTESIS  02/19/2019  . IR PARACENTESIS  03/01/2019  . IR PARACENTESIS  03/15/2019  . IR PARACENTESIS  04/03/2019  . IR PARACENTESIS  04/12/2019  . IR PARACENTESIS  05/01/2019  . IR PARACENTESIS  05/08/2019  . IR PARACENTESIS  05/24/2019  . IR PARACENTESIS  06/12/2019  . IR PARACENTESIS  07/09/2019  . IR PARACENTESIS  07/27/2019  . IR PARACENTESIS  08/09/2019  . IR PARACENTESIS  08/21/2019  . IR PARACENTESIS  09/17/2019  . IR PARACENTESIS  10/05/2019  . IR PARACENTESIS  10/29/2019  . IR PARACENTESIS  11/08/2019  . IR PARACENTESIS  12/12/2019  . IR PARACENTESIS  01/03/2020  . IR PARACENTESIS  01/10/2020  . IR PARACENTESIS  01/17/2020  . IR PARACENTESIS  01/24/2020  . IR PARACENTESIS  01/31/2020  . IR PARACENTESIS  02/07/2020  . IR PARACENTESIS  02/21/2020  . IR PARACENTESIS  05/21/2020  . IR RADIOLOGIST EVAL & MGMT  02/14/2018  . IR RADIOLOGIST EVAL & MGMT  02/22/2019  . LEFT HEART CATH AND CORONARY ANGIOGRAPHY N/A 02/22/2017   Procedure: LEFT HEART CATH AND CORONARY ANGIOGRAPHY;  Surgeon: Nigel Mormon, MD;  Location: [place] [facility];  Service: Cardiovascular;  Laterality: N/A;  .  LIGATION OF ARTERIOVENOUS  FISTULA Left 07/19/8414   Procedure: Plication of Left Arm Arteriovenous Fistula;  Surgeon: Elam Dutch, MD;  Location: [place];  Service: Vascular;  Laterality: Left;  . POLYPECTOMY    . POLYPECTOMY  01/25/2018   Procedure: POLYPECTOMY;  Surgeon: Jerene Bears, MD;  Location: [place];  Service: Gastroenterology;;  . REVISON OF ARTERIOVENOUS FISTULA Left 12/16/3014   Procedure: PLICATION OF DISTAL ANEURYSMAL SEGEMENT OF LEFT UPPER ARM ARTERIOVENOUS FISTULA;  Surgeon: Elam Dutch, MD;  Location: [place];  Service: Vascular;  Laterality: Left;  . REVISON OF ARTERIOVENOUS FISTULA Left 0/04/9322   Procedure: Plication of Left Upper Arm Fistula ;  Surgeon: Waynetta Sandy, MD;  Location: [place];  Service: Vascular;  Laterality: Left;  . SKIN GRAFT SPLIT THICKNESS LEG / FOOT Left    SKIN GRAFT SPLIT THICKNESS LEFT ARM DONOR SITE: LEFT ANTERIOR THIGH  . SKIN SPLIT GRAFT Left 07/04/2017   Procedure: SKIN GRAFT SPLIT THICKNESS LEFT ARM DONOR SITE: LEFT ANTERIOR THIGH;  Surgeon: Elam Dutch, MD;  Location: [place];  Service: Vascular;  Laterality: Left;  . THROMBECTOMY W/ EMBOLECTOMY Left 06/05/2017   Procedure: EXPLORATION OF LEFT ARM FOR BLEEDING; OVERSEWED PROXIMAL FISTULA;  Surgeon: Angelia Mould, MD;  Location: [place];  Service: Vascular;  Laterality: Left;  . WOUND EXPLORATION Left 06/03/2017   Procedure: WOUND EXPLORATION WITH WOUND VAC APPLICATION TO LEFT ARM;  Surgeon: Angelia Mould, MD;  Location[facility] OR;  Service: Vascular;  Laterality: Left;   Family History  Problem Relation Age of Onset  . Heart disease Mother   . Lung cancer Mother   . Heart disease Father   . Malignant hyperthermia Father   . COPD Father   . Throat cancer Sister   . Esophageal cancer Sister   . Hypertension Other   . COPD Other   . Throat cancer Sister   . Colon cancer Neg Hx   . Colon polyps Neg Hx   . Rectal cancer Neg Hx   . Stomach cancer Neg Hx      Social History   Tobacco Use  . Smoking status: Current Every Day Smoker    Packs/day: 1.00    Years: 43.00    Pack years: 43.00    Types: Cigarettes    Start date: 08/13/1973  . Smokeless tobacco: Never Used  Substance Use Topics  . Alcohol use: Not Currently    Comment: quit drinking in 2017  Marital Status: Single   ROS  Review of Systems  Constitutional: Negative for malaise/fatigue and weight gain.  HENT: Negative for sore throat.   Cardiovascular: Positive for dyspnea on exertion (chronic, stable ). Negative for chest pain, claudication, leg swelling, near-syncope, orthopnea, palpitations, paroxysmal nocturnal dyspnea and syncope.  Respiratory: Negative for shortness of breath and wheezing.   Hematologic/Lymphatic: Does not bruise/bleed easily.  Gastrointestinal: Negative for abdominal pain, hematochezia, melena and nausea.  Neurological: Negative for dizziness and weakness.   Objective  Blood pressure (!) 115/56, resp. rate 16, height 5\' 9"  (1.753 m), weight 162 lb 6.4 oz (73.7 kg), SpO2 96 %.  Vitals with BMI 07/22/2020 07/01/2020 07/01/2020  Height 5\' 9"  - -  Weight 162 lbs 6 oz - -  BMI 35.45 - -  Systolic 625 638 937  Diastolic 56 62 62  Pulse - 60 74  Some encounter information is confidential and restricted. Go to Review Flowsheets activity to see all data.     Physical Exam Vitals reviewed.  Constitutional:      General: He is not in acute distress.    Appearance: He is normal weight. He is ill-appearing.     Comments: Appears older than stated age.  Cardiovascular:     Rate and Rhythm: Normal rate. Rhythm irregularly irregular.     Heart sounds: Murmur heard.   Early systolic murmur is present with a grade of 2/6 at the upper right sternal border.  Low-pitched rumbling crescendo presystolic murmur is present with a grade of 3/4 at the apex.     Comments: S1 normal and S2 loud. No murmur. No JVD. No edema.  Right arm AV graft noted Pulmonary:      Effort: Pulmonary effort is normal. No accessory muscle usage or respiratory distress.     Breath sounds: Normal breath sounds.  Abdominal:     General: Bowel sounds are normal.     Palpations: Abdomen is soft.  Musculoskeletal:        General: Normal range of motion.    Laboratory examination:   Recent Labs    02/17/20 1208 02/18/20 0002 02/18/20 0215 05/19/20 0934 05/22/20 0838 06/16/20 2323 06/30/20 1755 06/30/20 1803  NA 135 131* 136   < > 135 134* 130* 129*  K 6.7* 6.5* 3.7   < > 4.3 4.9 7.4* 7.2*  CL 92* 90* 96*   < > 96* 90* 89* 95*  CO2 28 26 28    < > 27 28 24   --   GLUCOSE 105* 108* 109*   < > 106* 94 85 81  BUN 40* 49* 16   < > 24* 18 57* 67*  CREATININE 5.89* 6.37* 2.90*   < > 5.98* 5.23* 9.76* 9.60*  CALCIUM 9.6 9.8 9.2   < > 9.2 9.7 10.2  --   GFRNONAA 10* 9* 23*   < > 10* 12* 6*  --   GFRAA 11* 10* 27*  --   --   --   --   --    < > = values in this interval not displayed.   CrCl cannot be calculated (Patient's most recent lab result is older than the maximum 21 days allowed.).  CMP Latest Ref Rng & Units 06/30/2020 06/30/2020 06/16/2020  Glucose 70 - 99 mg/dL 81 85 94  BUN 6 - 20 mg/dL 67(H) 57(H) 18  Creatinine 0.61 - 1.24 mg/dL 9.60(H) 9.76(H) 5.23(H)  Sodium 135 - 145 mmol/L 129(L) 130(L) 134(L)  Potassium 3.5 - 5.1  mmol/L 7.2(HH) 7.4(HH) 4.9  Chloride 98 - 111 mmol/L 95(L) 89(L) 90(L)  CO2 22 - 32 mmol/L - 24 28  Calcium 8.9 - 10.3 mg/dL - 10.2 9.7  Total Protein 6.5 - 8.1 g/dL - - -  Total Bilirubin 0.3 - 1.2 mg/dL - - -  Alkaline Phos 38 - 126 U/L - - -  AST 15 - 41 U/L - - -  ALT 0 - 44 U/L - - -   CBC Latest Ref Rng & Units 06/30/2020 06/30/2020 06/16/2020  WBC 4.0 - 10.5 K/uL - 12.1(H) 7.9  Hemoglobin 13.0 - 17.0 g/dL 12.2(L) 11.6(L) 10.3(L)  Hematocrit 39.0 - 52.0 % 36.0(L) 34.3(L) 30.1(L)  Platelets 150 - 400 K/uL - 170 127(L)   Lipid Panel     Component Value Date/Time   CHOL 140 02/18/2020 0001   TRIG 107 02/18/2020 0001   HDL 38  (L) 02/18/2020 0001   CHOLHDL 3.7 02/18/2020 0001   VLDL 21 02/18/2020 0001   LDLCALC 81 02/18/2020 0001   HEMOGLOBIN A1C Lab Results  Component Value Date   HGBA1C 4.8 01/26/2020   MPG 91.06 01/26/2020   TSH No results for input(s): TSH in the last 8760 hours.  Lipid Panel Recent Labs    10/04/19 1400 02/18/20 0001  CHOL  --  140  TRIG 183* 107  LDLCALC  --  81  VLDL  --  21  HDL  --  38*  CHOLHDL  --  3.7    External labs:   03/11/2018: TSH 1.32 normal  Medications and allergies   Allergies  Allergen Reactions  . Tramadol Itching and Other (See Comments)    Other reaction(s): Unknown  . Grass Extracts [Gramineae Pollens]     Other reaction(s): Sneezing  . Morphine And Related Other (See Comments)    Stomach pain  . Pollen Extract Other (See Comments)    Other reaction(s): Sneezing (finding)  . Acetaminophen Nausea Only    Stomach ache   . Aspirin Itching and Other (See Comments)    STOMACH PAIN  Other reaction(s): Unknown  . Clonidine Derivatives Itching     Current Outpatient Medications  Medication Instructions  . albuterol (VENTOLIN HFA) 108 (90 Base) MCG/ACT inhaler 2 puffs, Inhalation, Every 4 hours PRN  . amiodarone (PACERONE) 200 mg, Oral, Daily  . amoxicillin (AMOXIL) 250 MG capsule Oral  . budesonide-formoterol (SYMBICORT) 160-4.5 MCG/ACT inhaler 2 puff(s)  . cefdinir (OMNICEF) 300 mg, Oral, Every M-W-F (1800)  . cetirizine (ZYRTEC) 10 mg, Oral, Daily PRN  . ciclopirox (PENLAC) 8 % solution Topical, Daily at bedtime, For 8 weeks  . diltiazem (CARDIZEM CD) 120 MG 24 hr capsule TAKE 1 CAPSULE BY MOUTH EVERY DAY  . diphenhydrAMINE (BENADRYL) 25 mg, Oral, Every 6 hours PRN  . gabapentin (NEURONTIN) 300 mg, Oral, 2 times daily  . LOPERAMIDE HCL PO Oral  . metoprolol succinate (TOPROL XL) 25 mg, Oral, Daily  . ondansetron (ZOFRAN) 4 MG tablet TAKE 1 TABLET BY MOUTH EVERY 8 HOURS AS NEEDED FOR NAUSEA AND VOMITING  . Oxycodone HCl 10 mg, Oral, 3  times daily PRN  . pantoprazole (PROTONIX) 40 mg, Oral, 2 times daily  . sevelamer carbonate (RENVELA) 2,400 mg, Oral, See admin instructions, Take 3 tablets (2400 mg) by mouth up to three times daily with meals  . tenofovir (VIREAD) 300 MG tablet Take 1 by mouth every Monday.   Radiology:   No results found.  Cardiac Studies:   Coronary Angiogram 02/22/2017: No significant  disease on left, mid RCA high-grade stenosis status post 3.5 x 23 mm Xience [place] DES.  Carotid Doppler 10/28/2016: Stenosis in the right common carotid artery (<50%) with severe heteregenous plaque. Stenosis in the left internal carotid artery (16-49%). Severe heteregenous plaque in the left common carotid artery with < 50% stenosis. Mild stenosis in the left external carotid artery (<50%). Antegrade vertebral artery flow. Follow up in one year is appropriate if clinically indicated.  Lower Venous DVT Study 10/05/2019: BILATERAL: - No evidence of deep vein thrombosis seen in the lower extremities, bilaterally.  RIGHT & LEFT: - No cystic structure found in the popliteal fossa.   Echocardiogram 02/05/2020: 1. Left ventricular ejection fraction, by estimation, is 50 to 55%. The left ventricle has normal function. There is severe left ventricular hypertrophy. Left ventricular diastolic function could not be evaluated. I cannot exclude septal hypokinesis. Endocardium not well visualized. septal motion also is consistent with LBBB.  2. Right ventricular systolic function is mildly reduced. The right ventricular size is moderately enlarged. Severely increased right ventricular wall thickness. There is moderately elevated pulmonary artery systolic pressure. The estimated right ventricular systolic pressure is 03.5 mmHg.  3. Left atrial size was severely dilated.  4. Right atrial size was severely dilated.  5. The mitral valve is normal in structure. Mild mitral valve regurgitation.  6. Dilated TV annulus. Tricuspid valve  regurgitation is moderate to severe.  7. AV sclerosis without stenosis. The aortic valve is tricuspid. Aortic valve regurgitation is trivial. Mild aortic valve sclerosis is present, with no evidence of aortic valve stenosis. Aortic valve mean gradient measures 8.3 mmHg.  8. The inferior vena cava is dilated in size with <50% respiratory variability, suggesting right atrial pressure of 15 mmHg.    EKG   EKG 07/22/2020: Atrial fibrillation with controlled ventricular response at a rate of 86 bpm.  Left bundle branch block, no further analysis. Compared to EKG 03/25/2020, no significant change.    EKG 03/25/2020: Atrial fibrillation with controlled ventricular response at a rate of 81 bpm, complete left bundle branch block. No further analysis.  Compared to EKG 02/07/2020 ventricular rate controlled.   EKG 02/07/2020: Atrial fibrillation with rapid ventricular response, ventricular rate 111 bpm, left bundle branch block.  No further analysis.  EKG 12/27/2019: Sinus rhythm with first-degree AV block at rate of 77 bpm, rare PACs, left bundle branch block.  No further analysis.  Assessment     ICD-10-CM   1. Paroxysmal atrial fibrillation (HCC)  I48.0 EKG 12-Lead    metoprolol succinate (TOPROL XL) 25 MG 24 hr tablet  2. Cor pulmonale, chronic (HCC)  I27.81   3. CKD (chronic kidney disease) stage V requiring chronic dialysis (HCC)  N18.6    Z99.2   4. Left bundle branch block  I44.7    .   Meds ordered this encounter  Medications  . metoprolol succinate (TOPROL XL) 25 MG 24 hr tablet    Sig: Take 1 tablet (25 mg total) by mouth daily.    Dispense:  30 tablet    Refill:  3  . amiodarone (PACERONE) 200 MG tablet    Sig: Take 1 tablet (200 mg total) by mouth daily.    Dispense:  30 tablet    Refill:  3    Medications Discontinued During This Encounter  Medication Reason  . amiodarone (PACERONE) 200 MG tablet Reorder  . metoprolol succinate (TOPROL XL) 25 MG 24 hr tablet Reorder     Recommendations:   Joshua Abed  Diaz  is a 60 y.o.  male  with very complex medical problems including severe COPD and chronic cor pulmonale with pulmonary hypertension, coronary artery disease s/p stenting to mid RCA in 2018, paroxysmal atrial fibrillation with RVR, hypertensive cardiomyopathy, dietary and medication noncompliance, end-stage renal disease on hemodialysis, chronic hepatitis B and C with cirrhosis of the liver and ascites, tobacco use, smokes 1/2 ppd, h/o marijuana use and prior cocaine use quit in 2017. Not on anticoagulation due to cirrhosis of liver and compliance with Warfarin.  He has had multiple ER visits for A fib with RVR, syncope, shortness of breath and hyperkalemia.  He has also been in the hospital with acute respiratory distress and pulmonary edema due to missed dialysis.   Patient presents for 6-week follow-up of atrial fibrillation and heart failure.  He is presently without clinical signs of heart failure.  Patient states he has been feeling well since recent hospitalization.  Patient is currently taking diltiazem, however he is unsure if he is taking metoprolol succinate and amiodarone.  Patient is in atrial fibrillation with controlled ventricular response today.  Advised patient to verify that he is taking both amiodarone and metoprolol succinate as well as his other cardiovascular medications, patient will notify our office if he has not been taking these medications.  I have recent prescriptions for both metoprolol and amiodarone.  Encourage patient to continue to take medications as directed and to abide by his dialysis schedule.  Advised him regarding the importance of continued compliance with both dialysis and medications.  Follow-up in 6 weeks for atrial fibrillation and heart failure.   Alethia Berthold, PA-C 07/22/2020, 3:05 PM Office: [phone]

## 2020-07-22 ENCOUNTER — Encounter: Payer: Self-pay | Admitting: Student

## 2020-07-22 ENCOUNTER — Ambulatory Visit: Payer: Medicare Other | Admitting: Student

## 2020-07-22 ENCOUNTER — Other Ambulatory Visit: Payer: Self-pay

## 2020-07-22 VITALS — BP 115/56 | Resp 16 | Ht 69.0 in | Wt 162.4 lb

## 2020-07-22 DIAGNOSIS — I480 Paroxysmal atrial fibrillation: Secondary | ICD-10-CM

## 2020-07-22 DIAGNOSIS — N186 End stage renal disease: Secondary | ICD-10-CM

## 2020-07-22 DIAGNOSIS — I2781 Cor pulmonale (chronic): Secondary | ICD-10-CM

## 2020-07-22 DIAGNOSIS — Z992 Dependence on renal dialysis: Secondary | ICD-10-CM

## 2020-07-22 DIAGNOSIS — I447 Left bundle-branch block, unspecified: Secondary | ICD-10-CM

## 2020-07-22 MED ORDER — AMIODARONE HCL 200 MG PO TABS: 200.0000 mg | ORAL_TABLET | Freq: Every day | ORAL | 3 refills | Status: DC

## 2020-07-22 MED ORDER — METOPROLOL SUCCINATE ER 25 MG PO TB24: 25.0000 mg | ORAL_TABLET | Freq: Every day | ORAL | 3 refills | Status: DC

## 2020-07-24 ENCOUNTER — Ambulatory Visit (HOSPITAL_COMMUNITY): Admission: RE | Admit: 2020-07-24 | Payer: Medicare Other | Source: Ambulatory Visit

## 2020-07-24 ENCOUNTER — Telehealth: Payer: Self-pay | Admitting: Family Medicine

## 2020-07-24 IMAGING — US IR PARACENTESIS
1 series · 2 of 2 positions shown · non-contrast
Comparison: none

INDICATION: Patient with history of alcoholic cirrhosis with recurrent ascites.
Request made for diagnostic and therapeutic paracentesis with a max
of 7 L.

[Series 1: ir (id) (id)/(id)/(id) ir · 2 of 2 slices shown]
[im 1/2]
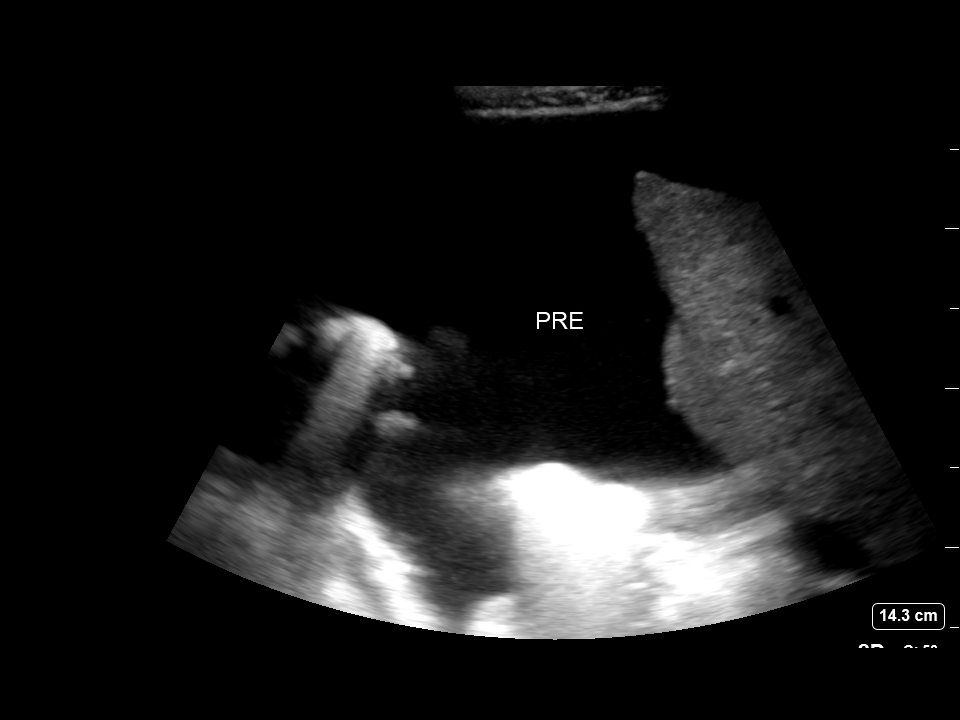
[im 2/2]
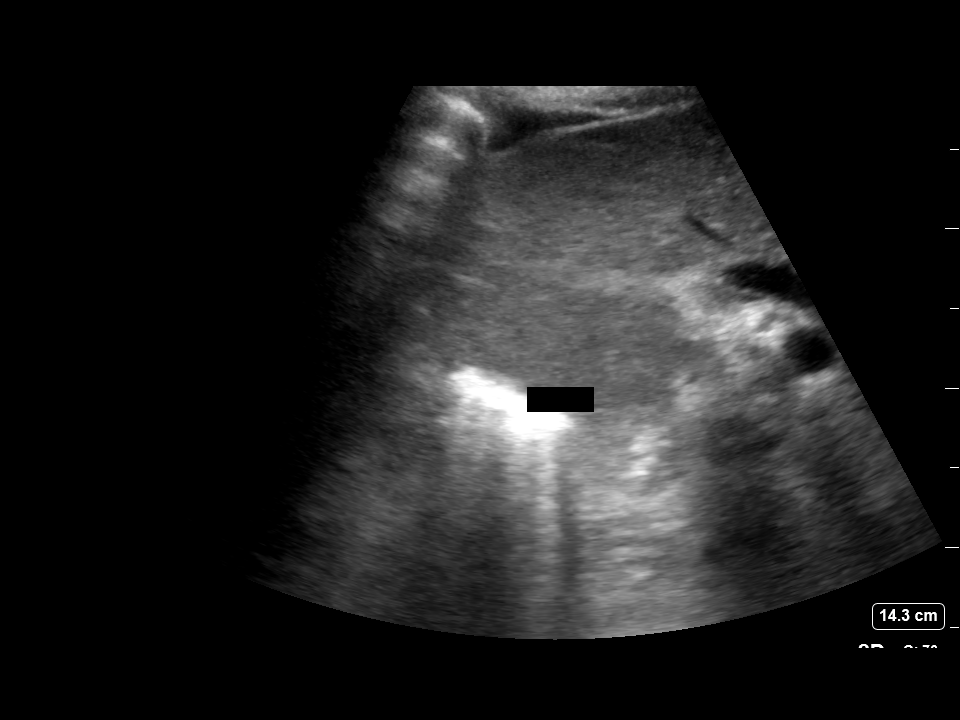

[2 of 2 positions shown; findings below may reference images not displayed]

EXAM:
ULTRASOUND GUIDED DIAGNOSTIC AND THERAPEUTIC PARACENTESIS

MEDICATIONS:
10 mL 1% lidocaine

COMPLICATIONS:
None immediate.

PROCEDURE:
Informed written consent was obtained from the patient after a
discussion of the risks, benefits and alternatives to treatment. A
timeout was performed prior to the initiation of the procedure.

Initial ultrasound scanning demonstrates a large amount of ascites
within the right lower abdominal quadrant. The right lower abdomen
was prepped and draped in the usual sterile fashion. 1% lidocaine
was used for local anesthesia.

Following this, a 19 gauge, 7-cm, Yueh catheter was introduced. An
ultrasound image was saved for documentation purposes. The
paracentesis was performed. The catheter was removed and a dressing
was applied. The patient tolerated the procedure well without
immediate post procedural complication.
FINDINGS: A total of approximately 5.4 L of hazy gold fluid was removed.
Samples were sent to the laboratory as requested by the clinical
team.
IMPRESSION: Successful ultrasound-guided paracentesis yielding 5.4 L of
peritoneal fluid.

## 2020-07-24 NOTE — Telephone Encounter (Signed)
Pt has been called and informed of new date and time of CT scan.

## 2020-07-24 NOTE — Telephone Encounter (Signed)
Radiology notified me that he missed his CT scheduled for today. Can you please contact him to reschedule? Thanks

## 2020-08-02 IMAGING — DX PORTABLE CHEST - 1 VIEW
1 series · 1 of 1 positions shown · non-contrast
Comparison: 01/01/2019

CLINICAL DATA: fluid in abdomen;sob, hx of COPD, afib, ESRD

EXAM:
PORTABLE CHEST 1 VIEW

[chest]
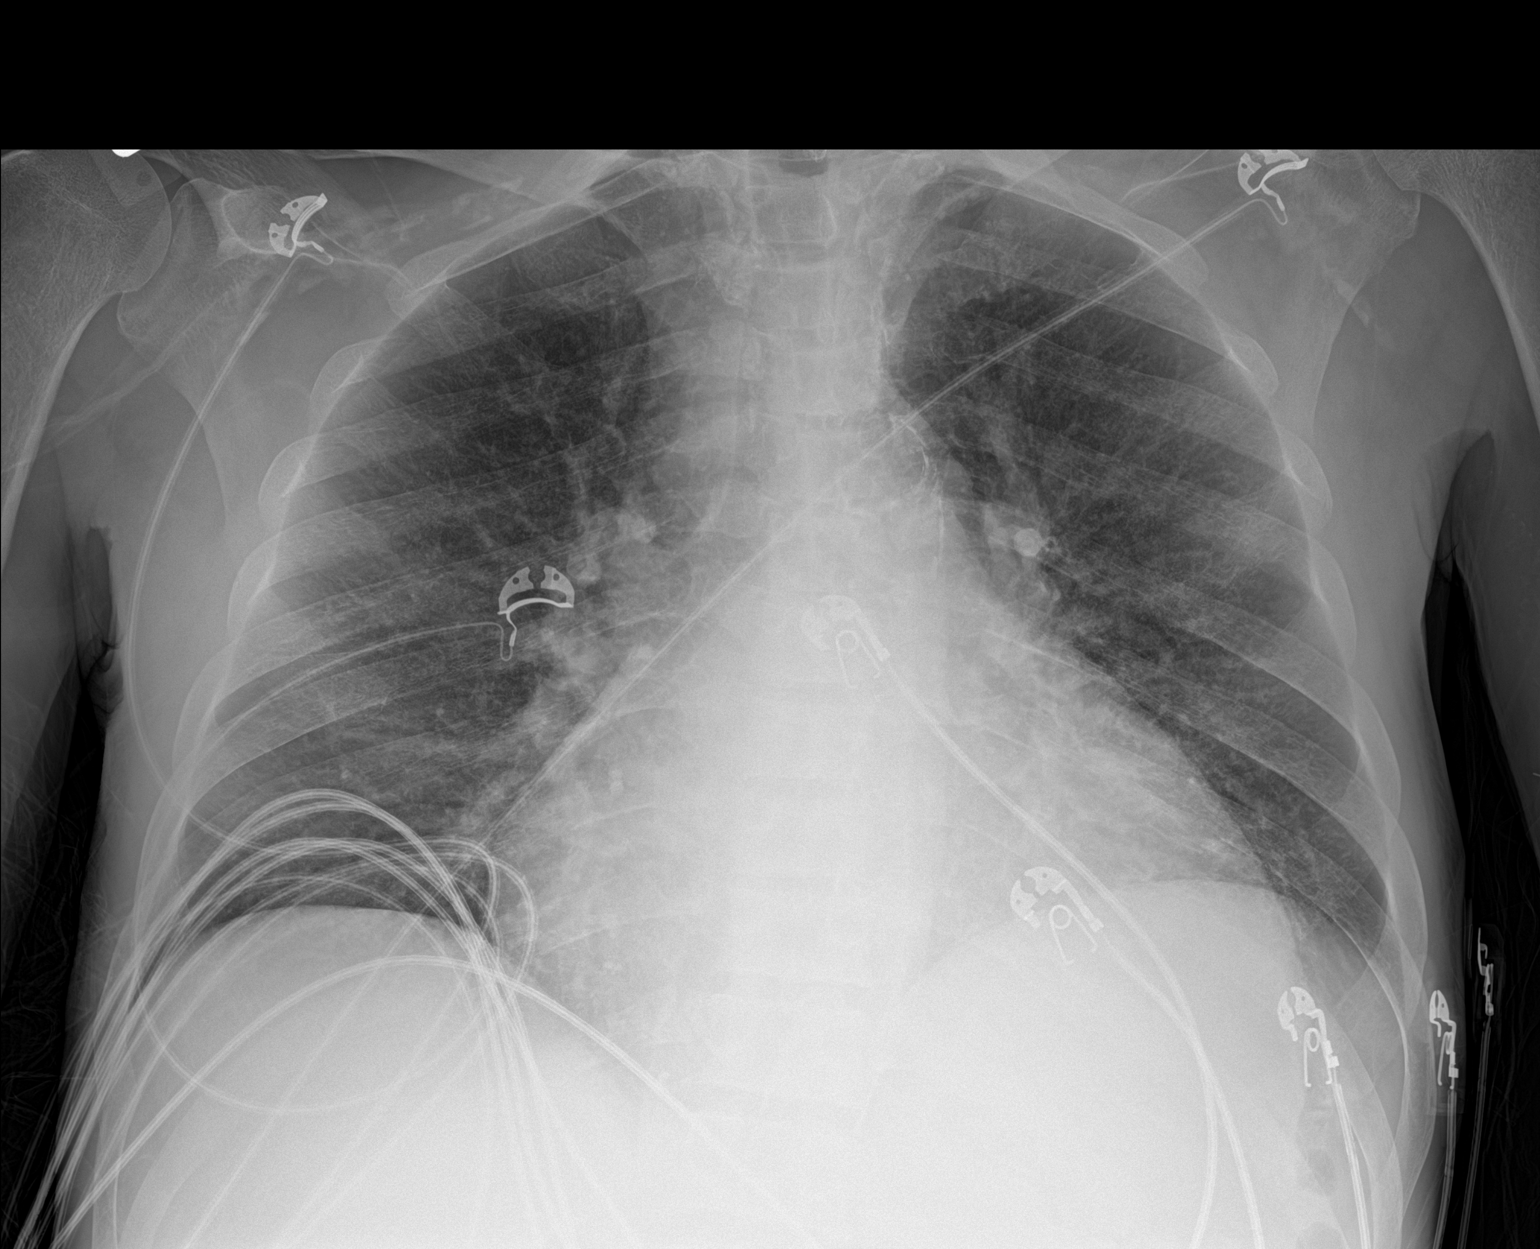

[1 of 1 positions shown; findings below may reference images not displayed]

FINDINGS: Enlarged cardiac silhouette, stable from the prior study. No
mediastinal or hilar masses.

Prominent bronchovascular interstitial markings, stable from the
prior chest radiographs. No evidence of pneumonia. No pleural
effusion or pneumothorax.

Skeletal structures are grossly intact.
IMPRESSION: 1. No significant change from the prior chest radiographs.
2. Cardiomegaly prominent bronchovascular and interstitial markings,
but without convincing pulmonary edema. No evidence of pneumonia.

## 2020-08-02 IMAGING — US IR PARACENTESIS
1 series · 4 of 4 positions shown · non-contrast
Comparison: Previous paracentesis.

MEDICATIONS:
10 cc 1% lidocaine

COMPLICATIONS:
None immediate.

INDICATION: Recurrent ascites

EXAM:
ULTRASOUND-GUIDED PARACENTESIS
TECHNIQUE: Informed written consent was obtained from the patient after a
discussion of the risks, benefits and alternatives to treatment. A
timeout was performed prior to the initiation of the procedure.

[Series 1: ir (id) (id)/(id)/(id) ir · 4 of 4 slices shown]
[im 1/4]
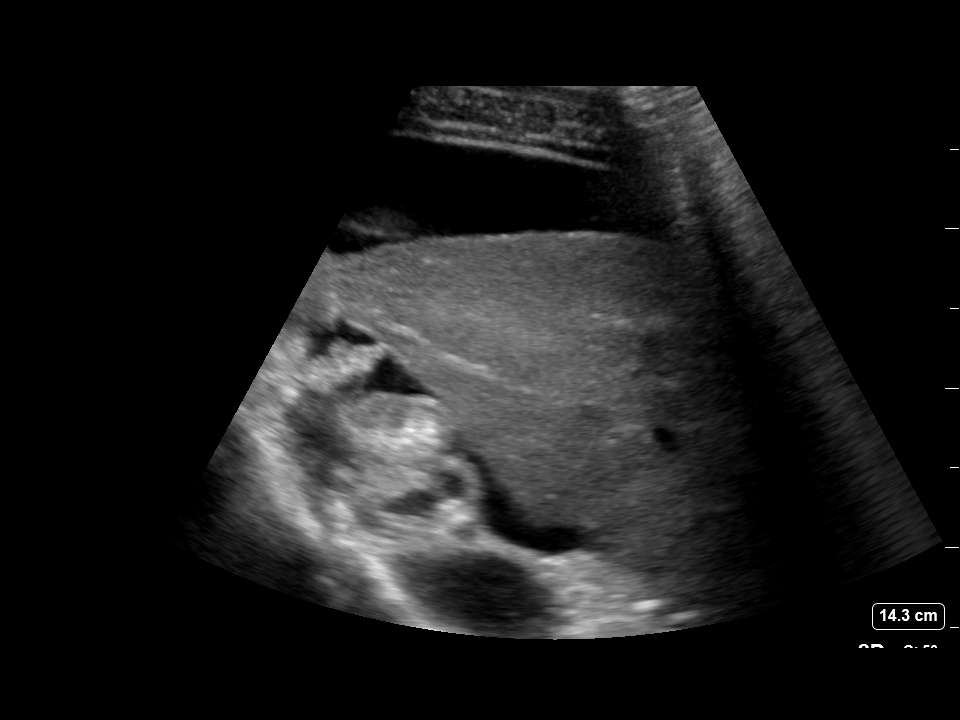
[im 2/4]
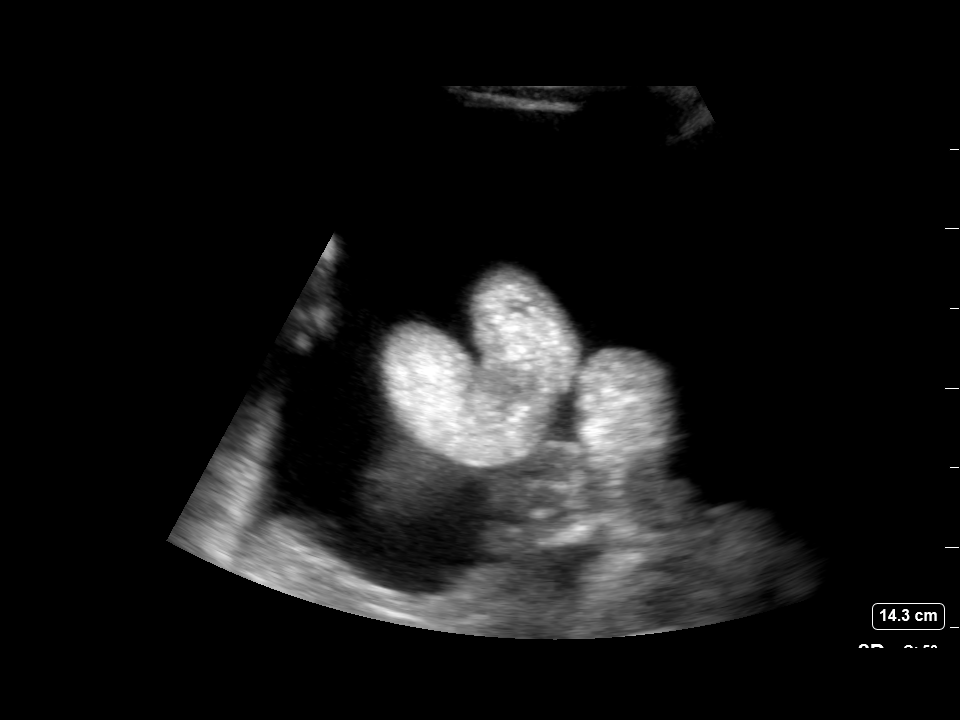
[im 3/4]
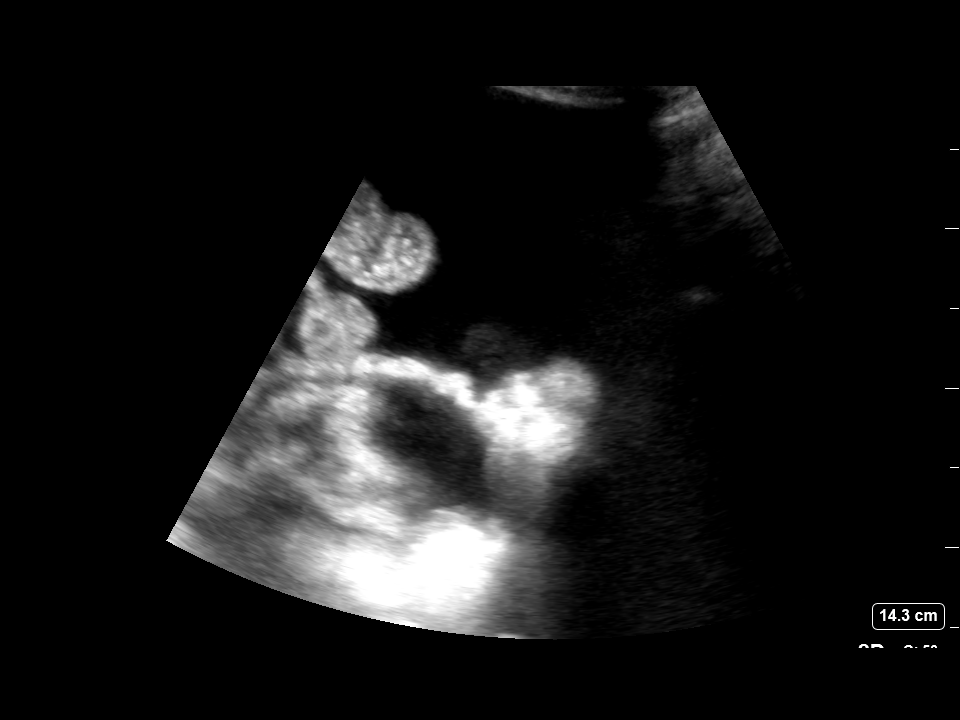
[im 4/4]
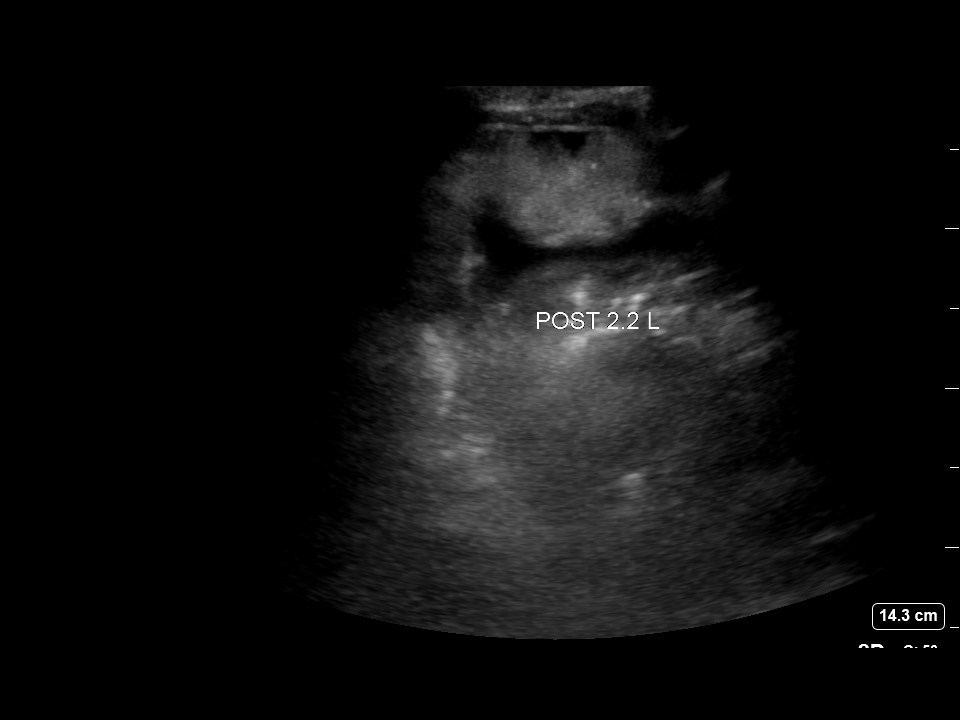

[4 of 4 positions shown; findings below may reference images not displayed]

Initial ultrasound scanning demonstrates a moderate amount of
ascites within the left lower abdominal quadrant. The left lower
abdomen was prepped and draped in the usual sterile fashion. 1%
lidocaine with epinephrine was used for local anesthesia. Under
direct ultrasound guidance, a 19 gauge, 7-cm, Yueh catheter was
introduced. An ultrasound image was saved for documentation
purposed. The paracentesis was performed. The catheter was removed
and a dressing was applied. The patient tolerated the procedure well
without immediate post procedural complication.
FINDINGS: A total of approximately 2.2 liters of yellow fluid was removed.
IMPRESSION: Successful ultrasound-guided paracentesis yielding 2.2 liters of
peritoneal fluid.

Read by

Cadet Garry Paez Mayor

## 2020-08-05 ENCOUNTER — Telehealth: Payer: Self-pay | Admitting: Family Medicine

## 2020-08-05 ENCOUNTER — Ambulatory Visit (HOSPITAL_COMMUNITY): Payer: Medicare Other

## 2020-08-05 NOTE — Telephone Encounter (Signed)
FYI, pt has appointment tomorrow.

## 2020-08-05 NOTE — Telephone Encounter (Signed)
FYI  Copied from [place] [phone]. Topic: General - Other >> Aug 05, 2020  1:04 PM Leward Quan A wrote: Reason for CRM: Tedra Coupe with [facility] called in to inform Dr Margarita Rana that patient was a No Show for his appointment today. Please be advised >> Aug 05, 2020  2:30 PM [place], Tawanna Solo, CMA wrote: Dr Margarita Rana is not at this practice. Thanks

## 2020-08-06 ENCOUNTER — Ambulatory Visit: Payer: Medicare Other | Attending: Family Medicine | Admitting: Family Medicine

## 2020-08-06 ENCOUNTER — Other Ambulatory Visit: Payer: Self-pay

## 2020-08-06 ENCOUNTER — Encounter: Payer: Self-pay | Admitting: Family Medicine

## 2020-08-06 VITALS — BP 121/87 | HR 61 | Ht 69.0 in | Wt 160.0 lb

## 2020-08-06 DIAGNOSIS — L03011 Cellulitis of right finger: Secondary | ICD-10-CM

## 2020-08-06 MED ORDER — CEPHALEXIN 500 MG PO CAPS
500.0000 mg | ORAL_CAPSULE | Freq: Every day | ORAL | 0 refills | Status: DC
Start: 2020-08-06 — End: 2020-09-02

## 2020-08-06 NOTE — Progress Notes (Signed)
Subjective:  Patient ID: [name], male    DOB: 1961-02-15  Age: 60 y.o. MRN: [id number]  CC: Rash   HPI [name] is a 60 year old male with a h/o ESRD on HD (MWF), Lver Cirrhosis with ascites (undergoing intermittent paracentesis), Chronic Hepatitis B, Atrial Fibrillation, Hypertension, Hyperlipidemia who presents today for an acute visit. At his last visit he had complained of hematuria and CT renal stone protocol ordered however he no showed for appointment. He states his Nephrologist gave him an antibiotic He had also complained of pain in his nails for which ciclopirox was prescribed (of note that was a telehealth visit).  Today he complains of blisters forming on his right index and right ring finger of an unknown duration which shooting pains.  He denies presence of fever, myalgias.  Past Medical History:  Diagnosis Date  . Anemia   . Anxiety   . Arthritis    left shoulder  . Atherosclerosis of aorta ([place])   . Cardiomegaly   . Chest pain    DATE UNKNOWN, C/O PERIODICALLY  . Cocaine abuse ([place])   . COPD exacerbation ([place]) 08/17/2016  . Coronary artery disease    stent 02/22/17  . Dysrhythmia   . ESRD (end stage renal disease) on dialysis ([place])    "[name]; MWF" (07/04/2017)  . GERD (gastroesophageal reflux disease)    DATE UNKNOWN  . Hemorrhoids   . Hepatitis B, chronic ([place])   . Hepatitis C   . History of kidney stones   . Hyperkalemia   . Hypertension   . Kidney failure   . Metabolic bone disease    Patient denies  . Mitral stenosis   . Myocardial infarction ([place])   . Pneumonia   . Pulmonary edema   . Solitary rectal ulcer syndrome 07/2017   at flex sig for rectal bleeding  . Tubular adenoma of colon     Past Surgical History:  Procedure Laterality Date  . A/V FISTULAGRAM Left 05/26/2017   Procedure: A/V FISTULAGRAM;  Surgeon: Conrad Rifton, MD;  Location: [place] [facility];  Service: Cardiovascular;  Laterality: Left;  .  A/V FISTULAGRAM Right 11/18/2017   Procedure: A/V FISTULAGRAM - Right Arm;  Surgeon: Elam Dutch, MD;  Location: [place] [facility];  Service: Cardiovascular;  Laterality: Right;  . APPLICATION OF WOUND VAC Left 06/14/2017   Procedure: APPLICATION OF WOUND VAC;  Surgeon: Katha Cabal, MD;  Location: [facility];  Service: Vascular;  Laterality: Left;  . AV FISTULA PLACEMENT  2012   BELIEVED WAS PLACED IN JUNE  . AV FISTULA PLACEMENT Right 08/09/2017   Procedure: Creation Right arm ARTERIOVENOUS BRACHIOCEPOHALIC FISTULA;  Surgeon: Elam Dutch, MD;  Location: [facility];  Service: Vascular;  Laterality: Right;  . AV FISTULA PLACEMENT Right 11/22/2017   Procedure: INSERTION OF ARTERIOVENOUS (AV) GORE-TEX GRAFT RIGHT UPPER ARM;  Surgeon: Elam Dutch, MD;  Location: [place];  Service: Vascular;  Laterality: Right;  . BIOPSY  01/25/2018   Procedure: BIOPSY;  Surgeon: Jerene Bears, MD;  Location: [place];  Service: Gastroenterology;;  . BIOPSY  04/10/2019   Procedure: BIOPSY;  Surgeon: Jerene Bears, MD;  Location: [facility];  Service: Gastroenterology;;  . COLONOSCOPY    . COLONOSCOPY WITH PROPOFOL N/A 01/25/2018   Procedure: COLONOSCOPY WITH PROPOFOL;  Surgeon: Jerene Bears, MD;  Location: [place];  Service: Gastroenterology;  Laterality: N/A;  . CORONARY STENT INTERVENTION N/A 02/22/2017  Procedure: CORONARY STENT INTERVENTION;  Surgeon: Nigel Mormon, MD;  Location: [place] [facility];  Service: Cardiovascular;  Laterality: N/A;  . ESOPHAGOGASTRODUODENOSCOPY (EGD) WITH PROPOFOL N/A 01/25/2018   Procedure: ESOPHAGOGASTRODUODENOSCOPY (EGD) WITH PROPOFOL;  Surgeon: Jerene Bears, MD;  Location: [place];  Service: Gastroenterology;  Laterality: N/A;  . ESOPHAGOGASTRODUODENOSCOPY (EGD) WITH PROPOFOL N/A 04/10/2019   Procedure: ESOPHAGOGASTRODUODENOSCOPY (EGD) WITH PROPOFOL;  Surgeon: Jerene Bears, MD;  Location: [facility];  Service: Gastroenterology;  Laterality:  N/A;  . FLEXIBLE SIGMOIDOSCOPY N/A 07/15/2017   Procedure: FLEXIBLE SIGMOIDOSCOPY;  Surgeon: Carol Ada, MD;  Location: [place];  Service: Endoscopy;  Laterality: N/A;  . HEMORRHOID BANDING    . I & D EXTREMITY Left 06/01/2017   Procedure: IRRIGATION AND DEBRIDEMENT LEFT ARM HEMATOMA WITH LIGATION OF LEFT ARM AV FISTULA;  Surgeon: Elam Dutch, MD;  Location: [place];  Service: Vascular;  Laterality: Left;  . I & D EXTREMITY Left 06/14/2017   Procedure: IRRIGATION AND DEBRIDEMENT EXTREMITY;  Surgeon: Katha Cabal, MD;  Location: [facility];  Service: Vascular;  Laterality: Left;  . INSERTION OF DIALYSIS CATHETER  05/30/2017  . INSERTION OF DIALYSIS CATHETER N/A 05/30/2017   Procedure: INSERTION OF DIALYSIS CATHETER;  Surgeon: Elam Dutch, MD;  Location: [place];  Service: Vascular;  Laterality: N/A;  . IR PARACENTESIS  08/30/2017  . IR PARACENTESIS  09/29/2017  . IR PARACENTESIS  10/28/2017  . IR PARACENTESIS  11/09/2017  . IR PARACENTESIS  11/16/2017  . IR PARACENTESIS  11/28/2017  . IR PARACENTESIS  12/01/2017  . IR PARACENTESIS  12/06/2017  . IR PARACENTESIS  01/03/2018  . IR PARACENTESIS  01/23/2018  . IR PARACENTESIS  02/07/2018  . IR PARACENTESIS  02/21/2018  . IR PARACENTESIS  03/06/2018  . IR PARACENTESIS  03/17/2018  . IR PARACENTESIS  04/04/2018  . IR PARACENTESIS  12/28/2018  . IR PARACENTESIS  01/08/2019  . IR PARACENTESIS  01/23/2019  . IR PARACENTESIS  02/01/2019  . IR PARACENTESIS  02/19/2019  . IR PARACENTESIS  03/01/2019  . IR PARACENTESIS  03/15/2019  . IR PARACENTESIS  04/03/2019  . IR PARACENTESIS  04/12/2019  . IR PARACENTESIS  05/01/2019  . IR PARACENTESIS  05/08/2019  . IR PARACENTESIS  05/24/2019  . IR PARACENTESIS  06/12/2019  . IR PARACENTESIS  07/09/2019  . IR PARACENTESIS  07/27/2019  . IR PARACENTESIS  08/09/2019  . IR PARACENTESIS  08/21/2019  . IR PARACENTESIS  09/17/2019  . IR PARACENTESIS  10/05/2019  . IR PARACENTESIS  10/29/2019  . IR PARACENTESIS   11/08/2019  . IR PARACENTESIS  12/12/2019  . IR PARACENTESIS  01/03/2020  . IR PARACENTESIS  01/10/2020  . IR PARACENTESIS  01/17/2020  . IR PARACENTESIS  01/24/2020  . IR PARACENTESIS  01/31/2020  . IR PARACENTESIS  02/07/2020  . IR PARACENTESIS  02/21/2020  . IR PARACENTESIS  05/21/2020  . IR RADIOLOGIST EVAL & MGMT  02/14/2018  . IR RADIOLOGIST EVAL & MGMT  02/22/2019  . LEFT HEART CATH AND CORONARY ANGIOGRAPHY N/A 02/22/2017   Procedure: LEFT HEART CATH AND CORONARY ANGIOGRAPHY;  Surgeon: Nigel Mormon, MD;  Location: [place] [facility];  Service: Cardiovascular;  Laterality: N/A;  . LIGATION OF ARTERIOVENOUS  FISTULA Left 02/10/4234   Procedure: Plication of Left Arm Arteriovenous Fistula;  Surgeon: Elam Dutch, MD;  Location: [facility];  Service: Vascular;  Laterality: Left;  . POLYPECTOMY    . POLYPECTOMY  01/25/2018   Procedure:  POLYPECTOMY;  Surgeon: Jerene Bears, MD;  Location: [facility];  Service: Gastroenterology;;  . REVISON OF ARTERIOVENOUS FISTULA Left 2/42/3536   Procedure: PLICATION OF DISTAL ANEURYSMAL SEGEMENT OF LEFT UPPER ARM ARTERIOVENOUS FISTULA;  Surgeon: Elam Dutch, MD;  Location: [place];  Service: Vascular;  Laterality: Left;  . REVISON OF ARTERIOVENOUS FISTULA Left 1/44/3154   Procedure: Plication of Left Upper Arm Fistula ;  Surgeon: Waynetta Sandy, MD;  Location: [place];  Service: Vascular;  Laterality: Left;  . SKIN GRAFT SPLIT THICKNESS LEG / FOOT Left    SKIN GRAFT SPLIT THICKNESS LEFT ARM DONOR SITE: LEFT ANTERIOR THIGH  . SKIN SPLIT GRAFT Left 07/04/2017   Procedure: SKIN GRAFT SPLIT THICKNESS LEFT ARM DONOR SITE: LEFT ANTERIOR THIGH;  Surgeon: Elam Dutch, MD;  Location: [place];  Service: Vascular;  Laterality: Left;  . THROMBECTOMY W/ EMBOLECTOMY Left 06/05/2017   Procedure: EXPLORATION OF LEFT ARM FOR BLEEDING; OVERSEWED PROXIMAL FISTULA;  Surgeon: Angelia Mould, MD;  Location: [place];  Service: Vascular;  Laterality: Left;  .  WOUND EXPLORATION Left 06/03/2017   Procedure: WOUND EXPLORATION WITH WOUND VAC APPLICATION TO LEFT ARM;  Surgeon: Angelia Mould, MD;  Location: [facility] OR;  Service: Vascular;  Laterality: Left;    Family History  Problem Relation Age of Onset  . Heart disease Mother   . Lung cancer Mother   . Heart disease Father   . Malignant hyperthermia Father   . COPD Father   . Throat cancer Sister   . Esophageal cancer Sister   . Hypertension Other   . COPD Other   . Throat cancer Sister   . Colon cancer Neg Hx   . Colon polyps Neg Hx   . Rectal cancer Neg Hx   . Stomach cancer Neg Hx     Allergies  Allergen Reactions  . Tramadol Itching and Other (See Comments)    Other reaction(s): Unknown  . Grass Extracts [Gramineae Pollens]     Other reaction(s): Sneezing  . Morphine And Related Other (See Comments)    Stomach pain  . Pollen Extract Other (See Comments)    Other reaction(s): Sneezing (finding)  . Acetaminophen Nausea Only    Stomach ache   . Aspirin Itching and Other (See Comments)    STOMACH PAIN  Other reaction(s): Unknown  . Clonidine Derivatives Itching    Outpatient Medications Prior to Visit  Medication Sig Dispense Refill  . albuterol (VENTOLIN HFA) 108 (90 Base) MCG/ACT inhaler Inhale 2 puffs into the lungs every 4 (four) hours as needed for wheezing or shortness of breath. 8.5 g 2  . amiodarone (PACERONE) 200 MG tablet Take 1 tablet (200 mg total) by mouth daily. 30 tablet 3  . budesonide-formoterol (SYMBICORT) 160-4.5 MCG/ACT inhaler 2 puff(s)    . cetirizine (ZYRTEC) 10 MG tablet Take 10 mg by mouth daily as needed.    . ciclopirox (PENLAC) 8 % solution Apply topically at bedtime. For 8 weeks 6.6 mL 2  . diltiazem (CARDIZEM CD) 120 MG 24 hr capsule TAKE 1 CAPSULE BY MOUTH EVERY DAY 90 capsule 0  . diphenhydrAMINE (BENADRYL) 25 mg capsule Take 1 capsule (25 mg total) by mouth every 6 (six) hours as needed for itching or allergies. 30 capsule 0  .  gabapentin (NEURONTIN) 300 MG capsule Take 1 capsule (300 mg total) by mouth in the morning and at bedtime. 60 capsule 3  . LOPERAMIDE HCL PO Take by mouth.    Marland Kitchen  metoprolol succinate (TOPROL XL) 25 MG 24 hr tablet Take 1 tablet (25 mg total) by mouth daily. 30 tablet 3  . ondansetron (ZOFRAN) 4 MG tablet TAKE 1 TABLET BY MOUTH EVERY 8 HOURS AS NEEDED FOR NAUSEA AND VOMITING 30 tablet 3  . Oxycodone HCl 10 MG TABS Take 10 mg by mouth 3 (three) times daily as needed (pain).     . pantoprazole (PROTONIX) 40 MG tablet Take 1 tablet (40 mg total) by mouth 2 (two) times daily. 60 tablet 0  . sevelamer carbonate (RENVELA) 800 MG tablet Take 2,400 mg by mouth See admin instructions. Take 3 tablets (2400 mg) by mouth up to three times daily with meals    . tenofovir (VIREAD) 300 MG tablet Take 1 by mouth every Monday. 12 tablet 0  . amoxicillin (AMOXIL) 250 MG capsule Take by mouth.    . cefdinir (OMNICEF) 300 MG capsule Take 1 capsule (300 mg total) by mouth every Monday, Wednesday, and Friday at 6 PM. 24 capsule 0   No facility-administered medications prior to visit.     ROS Review of Systems  Constitutional: Negative for activity change and appetite change.  HENT: Negative for sinus pressure and sore throat.   Eyes: Negative for visual disturbance.  Respiratory: Negative for cough, chest tightness and shortness of breath.   Cardiovascular: Negative for chest pain and leg swelling.  Gastrointestinal: Negative for abdominal distention, abdominal pain, constipation and diarrhea.  Endocrine: Negative.   Genitourinary: Negative for dysuria.  Musculoskeletal:       See HPI  Skin: Negative for rash.  Allergic/Immunologic: Negative.   Neurological: Negative for weakness, light-headedness and numbness.  Psychiatric/Behavioral: Negative for dysphoric mood and suicidal ideas.    Objective:  BP 121/87   Pulse 61   Ht 5\' 9"  (1.753 m)   Wt 160 lb (72.6 kg)   SpO2 98%   BMI 23.63 kg/m    BP/Weight 08/06/2020 07/22/2020 92/42/6834  Systolic BP 196 222 979  Diastolic BP 87 56 62  Wt. (Lbs) 160 162.4 -  BMI 23.63 23.98 -  Some encounter information is confidential and restricted. Go to Review Flowsheets activity to see all data.      Physical Exam Constitutional:      Appearance: He is well-developed. He is ill-appearing.  Neck:     Vascular: No JVD.  Cardiovascular:     Rate and Rhythm: Normal rate. Rhythm irregular.     Heart sounds: Normal heart sounds. No murmur heard.   Pulmonary:     Effort: Pulmonary effort is normal.     Breath sounds: Normal breath sounds. No wheezing or rales.  Chest:     Chest wall: No tenderness.  Abdominal:     General: Bowel sounds are normal. There is no distension.     Palpations: Abdomen is soft. There is no mass.     Tenderness: There is no abdominal tenderness.  Musculoskeletal:        General: Normal range of motion.     Right lower leg: No edema.     Left lower leg: No edema.     Comments: Right index and ring finger with erythema and edema of pulp with associated tenderness to palpation  Neurological:     Mental Status: He is alert and oriented to person, place, and time.  Psychiatric:        Mood and Affect: Mood normal.     CMP Latest Ref Rng & Units 06/30/2020 06/30/2020 06/16/2020  Glucose  70 - 99 mg/dL 81 85 94  BUN 6 - 20 mg/dL 67(H) 57(H) 18  Creatinine 0.61 - 1.24 mg/dL 9.60(H) 9.76(H) 5.23(H)  Sodium 135 - 145 mmol/L 129(L) 130(L) 134(L)  Potassium 3.5 - 5.1 mmol/L 7.2(HH) 7.4(HH) 4.9  Chloride 98 - 111 mmol/L 95(L) 89(L) 90(L)  CO2 22 - 32 mmol/L - 24 28  Calcium 8.9 - 10.3 mg/dL - 10.2 9.7  Total Protein 6.5 - 8.1 g/dL - - -  Total Bilirubin 0.3 - 1.2 mg/dL - - -  Alkaline Phos 38 - 126 U/L - - -  AST 15 - 41 U/L - - -  ALT 0 - 44 U/L - - -    Lipid Panel     Component Value Date/Time   CHOL 140 02/18/2020 0001   TRIG 107 02/18/2020 0001   HDL 38 (L) 02/18/2020 0001   CHOLHDL 3.7 02/18/2020  0001   VLDL 21 02/18/2020 0001   LDLCALC 81 02/18/2020 0001    CBC    Component Value Date/Time   WBC 12.1 (H) 06/30/2020 1755   RBC 4.11 (L) 06/30/2020 1755   HGB 12.2 (L) 06/30/2020 1803   HCT 36.0 (L) 06/30/2020 1803   PLT 170 06/30/2020 1755   MCV 83.5 06/30/2020 1755   MCH 28.2 06/30/2020 1755   MCHC 33.8 06/30/2020 1755   RDW 17.0 (H) 06/30/2020 1755   LYMPHSABS 1.5 05/20/2020 0914   MONOABS 0.7 05/20/2020 0914   EOSABS 0.2 05/20/2020 0914   BASOSABS 0.1 05/20/2020 0914    Lab Results  Component Value Date   HGBA1C 4.8 01/26/2020    Assessment & Plan:  1. Felon of finger of right hand He will need to be seen by hand surgeon -urgent referral placed - cephALEXin (KEFLEX) 500 MG capsule; Take 1 capsule (500 mg total) by mouth daily. Take after dialysis sessions  Dispense: 7 capsule; Refill: 0 - AMB referral to orthopedics - CBC with Differential/Platelet   Meds ordered this encounter  Medications  . cephALEXin (KEFLEX) 500 MG capsule    Sig: Take 1 capsule (500 mg total) by mouth daily. Take after dialysis sessions    Dispense:  7 capsule    Refill:  0    Follow-up: Return for Medical conditions, keep previously scheduled appointment.       Charlott Rakes, MD, FAAFP. [facility] [place], [place]   08/06/2020, 1:38 PM

## 2020-08-06 NOTE — Progress Notes (Signed)
Has blisters on 2 fingers on right hand.

## 2020-08-07 LAB — CBC WITH DIFFERENTIAL/PLATELET
Basophils Absolute: 0.1 10*3/uL (ref 0.0–0.2)
Basos: 1 %
EOS (ABSOLUTE): 0.3 10*3/uL (ref 0.0–0.4)
Eos: 4 %
Hematocrit: 35.2 % — ABNORMAL LOW (ref 37.5–51.0)
Hemoglobin: 11.9 g/dL — ABNORMAL LOW (ref 13.0–17.7)
Immature Grans (Abs): 0 10*3/uL (ref 0.0–0.1)
Immature Granulocytes: 0 %
Lymphocytes Absolute: 1.6 10*3/uL (ref 0.7–3.1)
Lymphs: 21 %
MCH: 29 pg (ref 26.6–33.0)
MCHC: 33.8 g/dL (ref 31.5–35.7)
MCV: 86 fL (ref 79–97)
Monocytes Absolute: 0.7 10*3/uL (ref 0.1–0.9)
Monocytes: 9 %
Neutrophils Absolute: 4.9 10*3/uL (ref 1.4–7.0)
Neutrophils: 65 %
Platelets: 168 10*3/uL (ref 150–450)
RBC: 4.11 x10E6/uL — ABNORMAL LOW (ref 4.14–5.80)
RDW: 17 % — ABNORMAL HIGH (ref 11.6–15.4)
WBC: 7.6 10*3/uL (ref 3.4–10.8)

## 2020-08-08 ENCOUNTER — Telehealth: Payer: Self-pay

## 2020-08-08 NOTE — Telephone Encounter (Signed)
Pt returned call and was informed of lab results.

## 2020-08-08 NOTE — Telephone Encounter (Signed)
-----   Message from Charlott Rakes, MD sent at 08/07/2020  1:58 PM EST ----- Labs are stable and do not show evidence of infection.

## 2020-08-08 NOTE — Telephone Encounter (Signed)
Patient was called and a voicemail was left informing patient to return phone call for lab results. 

## 2020-08-11 ENCOUNTER — Telehealth: Payer: Self-pay | Admitting: Family Medicine

## 2020-08-11 NOTE — Telephone Encounter (Signed)
Called patient and LVM advising him that his appointment for 09/02/20 had been cancelled due to the provider being out of the office. Advised patient to call back [phone] to reschedule.

## 2020-08-12 ENCOUNTER — Ambulatory Visit (INDEPENDENT_AMBULATORY_CARE_PROVIDER_SITE_OTHER): Payer: Medicare Other | Admitting: Orthopaedic Surgery

## 2020-08-12 ENCOUNTER — Encounter: Payer: Self-pay | Admitting: Orthopaedic Surgery

## 2020-08-12 ENCOUNTER — Other Ambulatory Visit: Payer: Self-pay

## 2020-08-12 ENCOUNTER — Ambulatory Visit: Payer: Self-pay

## 2020-08-12 VITALS — Ht 71.0 in | Wt 156.0 lb

## 2020-08-12 DIAGNOSIS — M79641 Pain in right hand: Secondary | ICD-10-CM | POA: Diagnosis not present

## 2020-08-12 IMAGING — US PARACENTESIS WITH ULTRASOUND GUIDANCE
1 series · 9 of 9 positions shown · non-contrast
Comparison: none

INDICATION: Patient with history of alcoholic cirrhosis with recurrent ascites
presented to [REDACTED] today with complaints of abdominal pain.
Request for diagnostic and therapeutic paracentesis today in IR.

[Series 1: paracentesis with ultrasound guidance · 9 of 9 slices shown]
[im 1/9]
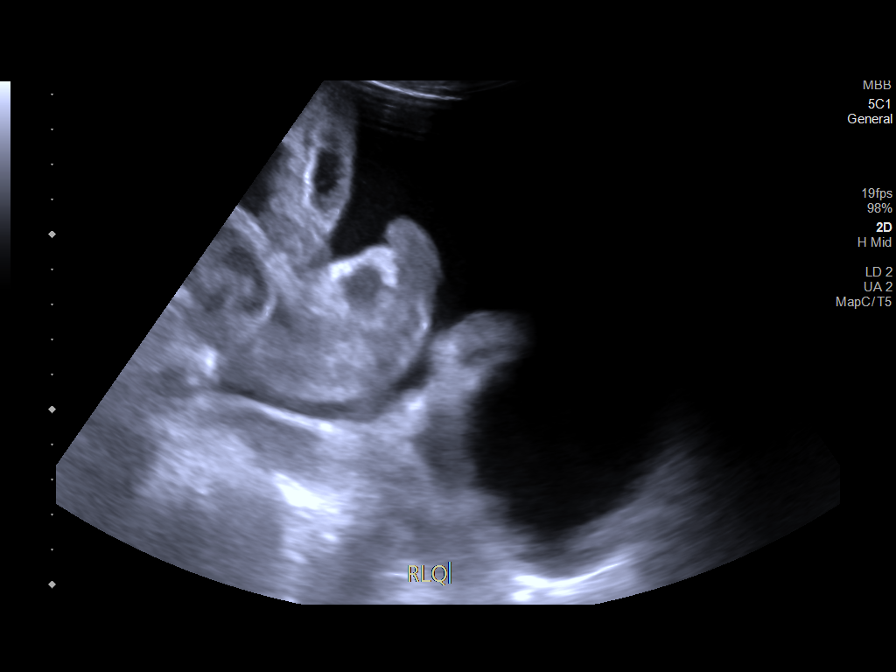
[im 2/9]
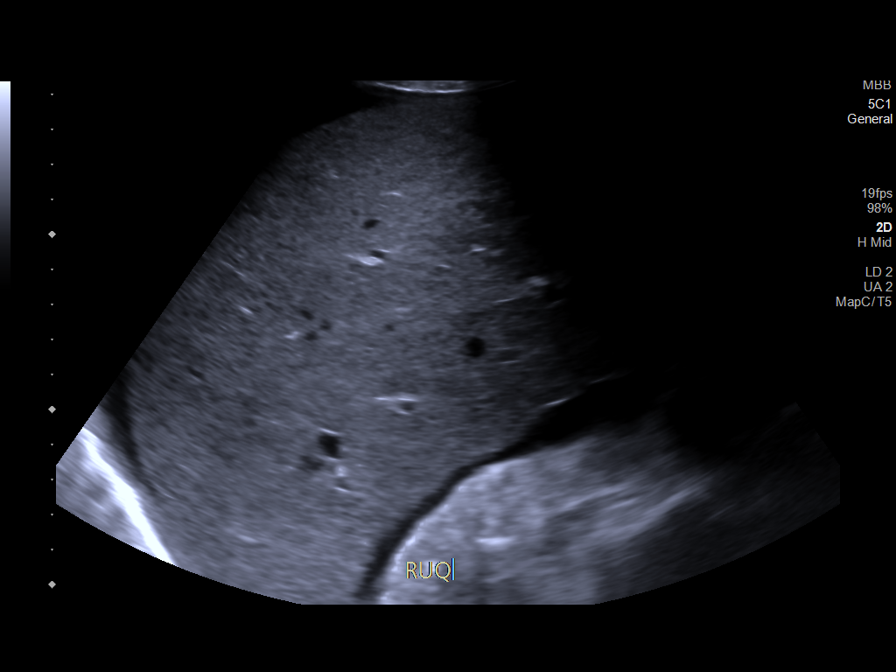
[im 3/9]
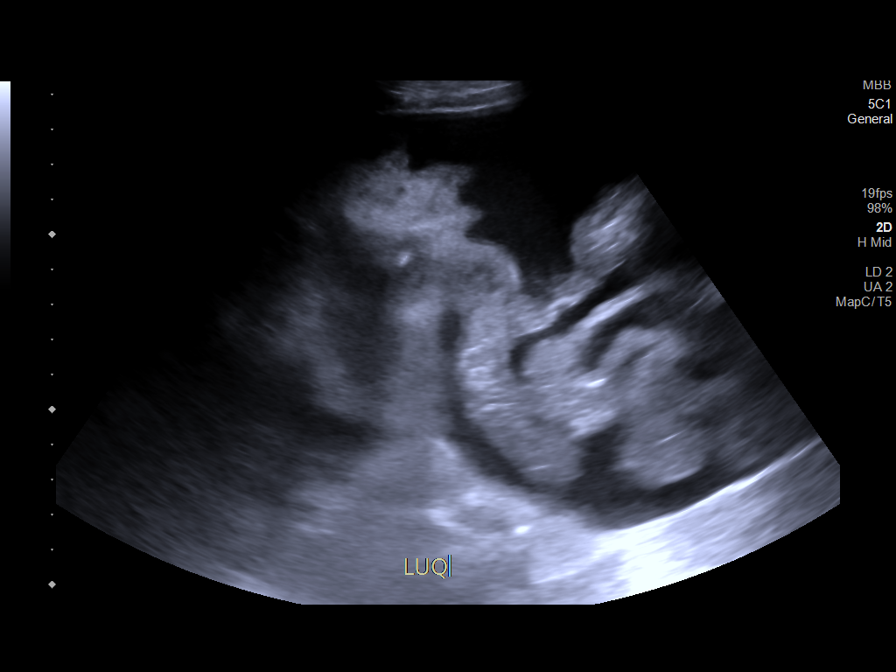
[im 4/9]
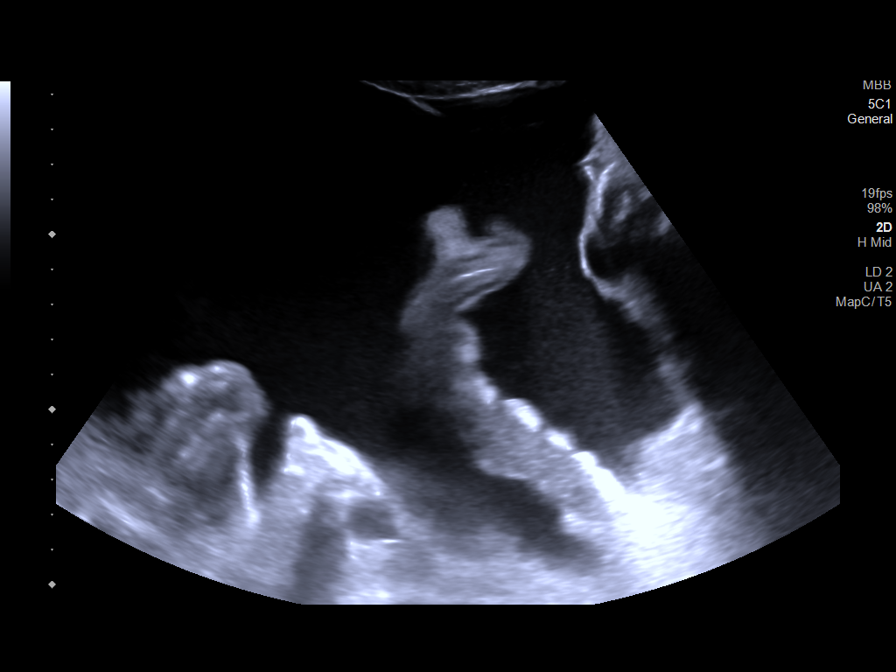
[im 5/9]
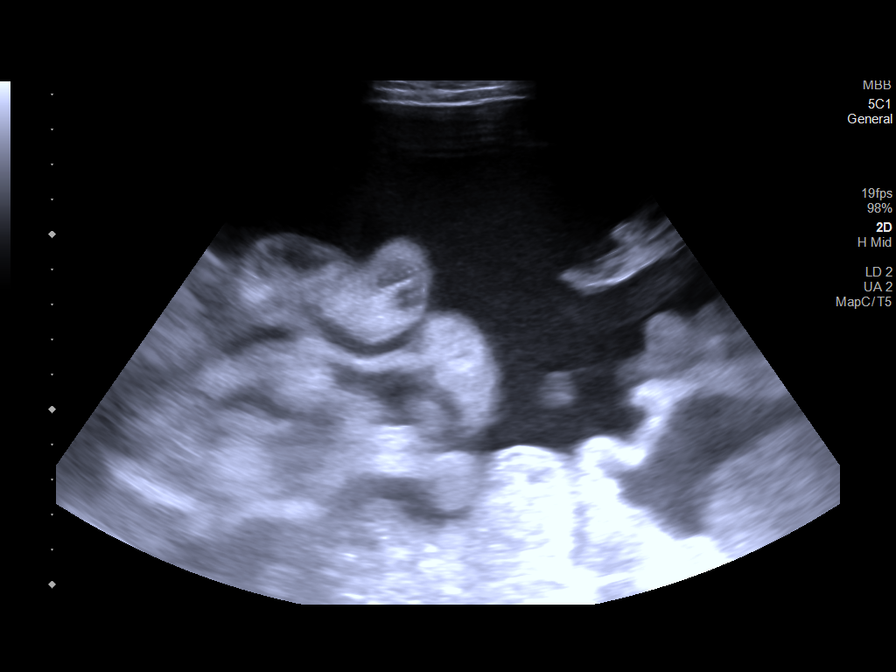
[im 6/9]
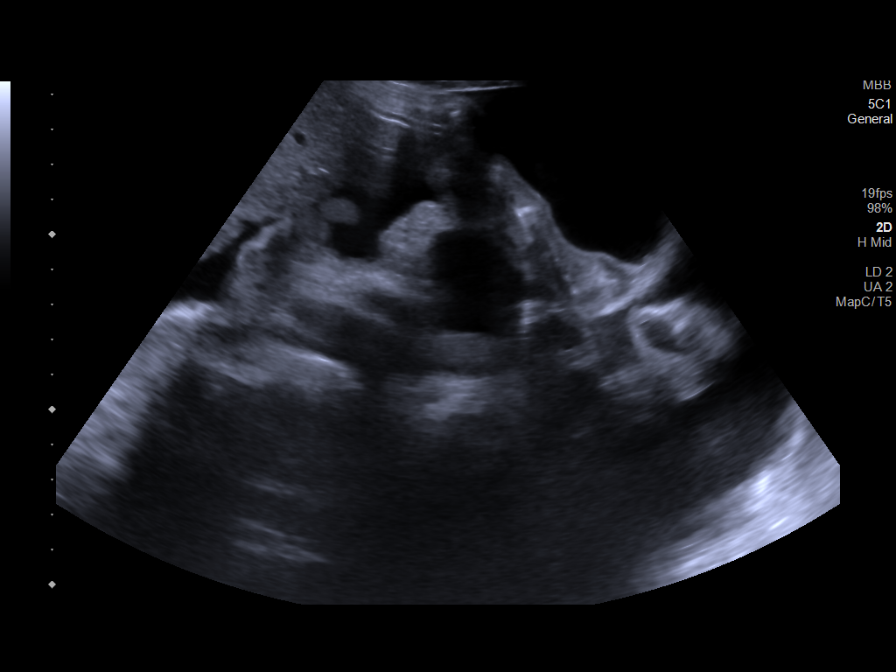
[im 7/9]
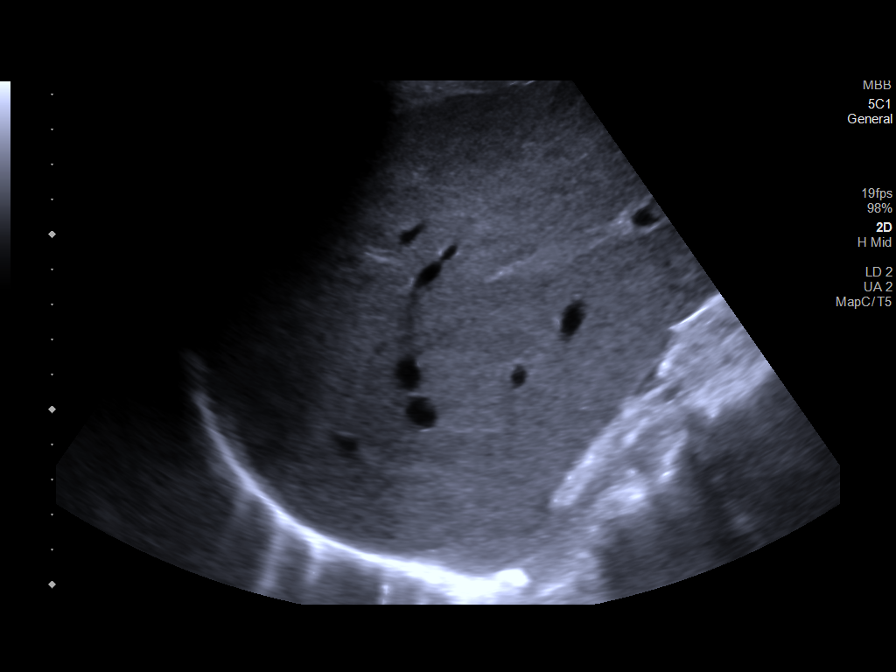
[im 8/9]
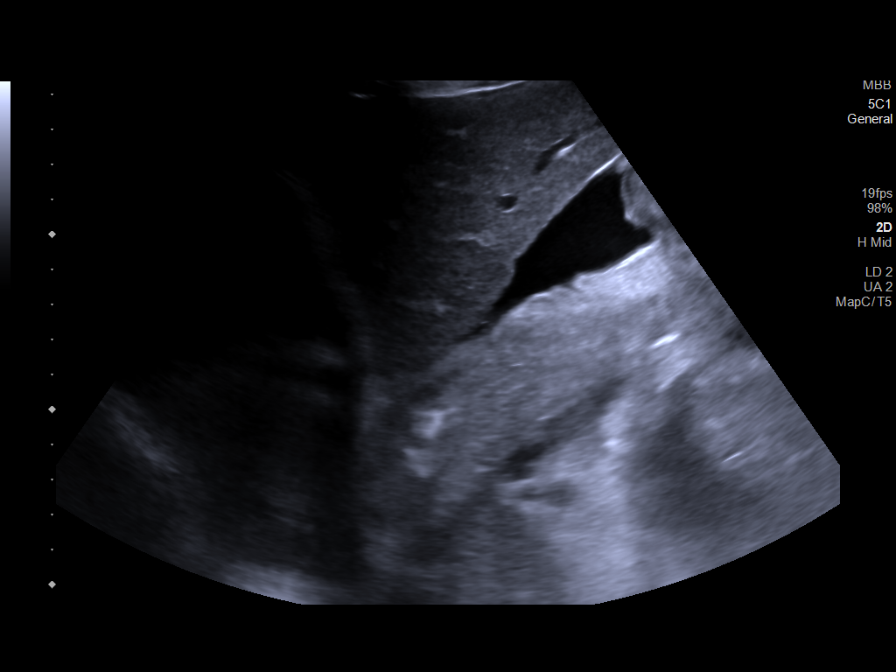
[im 9/9]
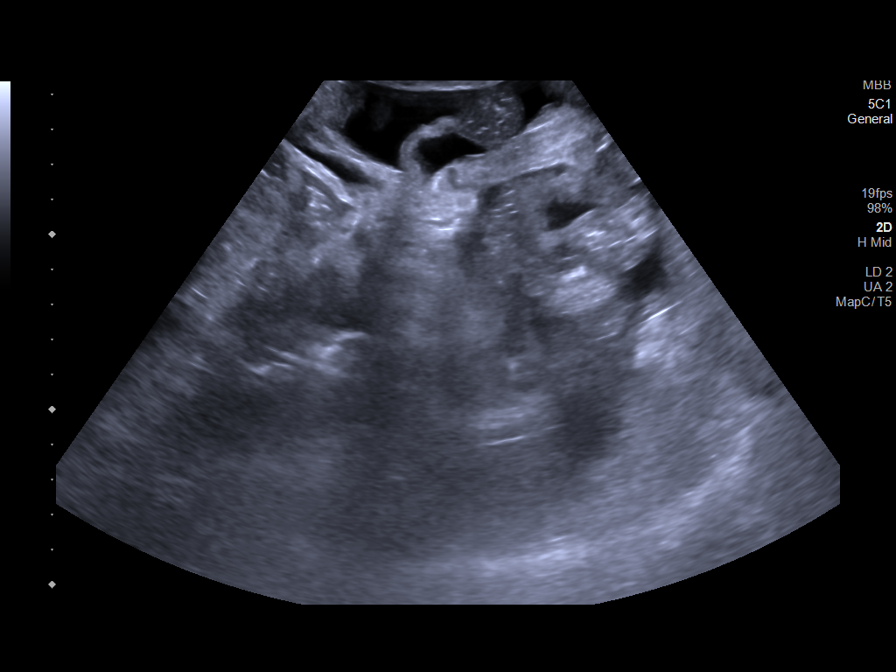

[9 of 9 positions shown; findings below may reference images not displayed]

EXAM:
ULTRASOUND GUIDED diagnostic and therapeutic PARACENTESIS

MEDICATIONS:
15 mL 1% lidocaine

COMPLICATIONS:
None immediate.

PROCEDURE:
Informed written consent was obtained from the patient after a
discussion of the risks, benefits and alternatives to treatment. A
timeout was performed prior to the initiation of the procedure.

Initial ultrasound scanning demonstrates a large amount of ascites
within the left lower abdominal quadrant. The left lower abdomen was
prepped and draped in the usual sterile fashion. 1% lidocaine was
used for local anesthesia.

Following this, a 19 gauge, 7-cm, Yueh catheter was introduced. An
ultrasound image was saved for documentation purposes. The
paracentesis was performed. The catheter was removed and a dressing
was applied. The patient tolerated the procedure well without
immediate post procedural complication.
FINDINGS: A total of approximately 2.7 L of amber fluid was removed. Samples
were sent to the laboratory as requested by the clinical team.
IMPRESSION: Successful ultrasound-guided paracentesis yielding 2.7 liters of
peritoneal fluid.

Read by Aujla, Danii

## 2020-08-12 NOTE — Progress Notes (Signed)
Office Visit Note   Patient: [name]           Date of Birth: Feb 11, 1961           MRN: [id number] Visit Date: 08/12/2020              Requested by: Charlott Rakes, MD [place],  [place] [address] PCP: Charlott Rakes, MD   Assessment & Plan: Visit Diagnoses:  1. Pain in right hand     Plan: Impression is chronic wounds to the tips of the right index and ring fingers concerning for Buerger's disease.  We would like to refer the patient to a hand specialist for further evaluation and treatment recommendation.  Follow-Up Instructions: Return if symptoms worsen or fail to improve.   Orders:  Orders Placed This Encounter  Procedures  . XR Hand Complete Right  . Ambulatory referral to Orthopedic Surgery   No orders of the defined types were placed in this encounter.     Procedures: No procedures performed   Clinical Data: No additional findings.   Subjective: Chief Complaint  Patient presents with  . Right Hand - Pain    HPI patient is a pleasant 60 year old gentleman who comes in today with concerns about his right index and ring fingers.  Approximately 2 months ago he noticed tips of the index and long fingers tips of those fingers.  No known injury or change in activity.  The pain is constant but worse to palpation.  He does note that he has smoked 1/2 pack of cigarettes per day and has been smoking since he was 60 years old.  He denies any fevers or chills.  No other systemic symptoms.  No drainage to either finger.  He does note numbness to the tip of the index finger.  Review of Systems as detailed in HPI.  All others reviewed and are negative.   Objective: Vital Signs: Ht 5\' 11"  (1.803 m)   Wt 156 lb (70.8 kg)   BMI 21.76 kg/m   Physical Exam well-developed well-nourished gentleman in no acute distress.  Alert oriented x3.  Ortho Exam right hand shows what appears to be scab formation to the tips of the ring and index  fingers.  No drainage.  He does have exquisite tenderness to the fingertips.  He has full range of motion.  Specialty Comments:  No specialty comments available.  Imaging: XR Hand Complete Right  Result Date: 08/12/2020 Atherosclerosis of digital arteries.  No acute or structural abnormalities.    PMFS History: Patient Active Problem List   Diagnosis Date Noted  . DNR (do not resuscitate)   . CAP (community acquired pneumonia) 02/09/2020  . Leukocytosis 01/19/2020  . Tobacco use 01/19/2020  . Acute pulmonary edema ([place]) 12/21/2019  . Acetaminophen overdose 10/27/2019  . ESRD (end stage renal disease) ([place])   . Goals of care, counseling/discussion   . Hypoxia 10/04/2019  . Advanced care planning/counseling discussion   . Renal failure 09/26/2019  . Dyspnea 06/09/2019  . Acquired thrombophilia ([place]) 06/05/2019  . A-fib ([place]) 05/30/2019  . Atrial fibrillation with RVR ([place]) 05/29/2019  . Melena   . Pressure injury of skin 03/09/2019  . Abdominal distention   . Volume overload 12/28/2018  . Sepsis ([place]) 09/12/2018  . Coronary artery disease involving native coronary artery of native heart without angina pectoris 03/11/2018  . Benign neoplasm of cecum   . Benign neoplasm of ascending colon   . Benign neoplasm of descending  colon   . Benign neoplasm of rectum   . AF (paroxysmal atrial fibrillation) ([place]) 01/23/2018  . Hx of colonic polyps 01/20/2018  . End-stage renal disease on hemodialysis ([place]) 11/21/2017  . GERD (gastroesophageal reflux disease) 11/16/2017  . Decompensated hepatic cirrhosis ([place]) 11/15/2017  . Palliative care by specialist   . Hyponatremia 11/04/2017  . SBP (spontaneous bacterial peritonitis) ([place]) 10/30/2017  . Liver disease, chronic 10/30/2017  . SOB (shortness of breath)   . Abdominal pain 10/28/2017  . Upper airway cough syndrome with flattening on f/v loop 10/13/17 c/w vcd 10/17/2017  . Elevated diaphragm 10/13/2017  . Ileus ([place]) 09/29/2017  .  QT prolongation 09/29/2017  . Malnutrition of moderate degree 09/29/2017  . Sinus congestion 09/03/2017  . Symptomatic anemia 09/02/2017  . Cirrhosis of liver with ascites ([place]) 09/02/2017  . Left bundle branch block 09/02/2017  . Mitral stenosis 09/02/2017  . Hematochezia 07/15/2017  . Wide-complex tachycardia ([place])   . Endotracheally intubated   . ESRD on dialysis ([place]) 07/04/2017  . CKD (chronic kidney disease) stage V requiring chronic dialysis ([place]) 06/18/2017  . History of Cocaine abuse ([place]) 06/18/2017  . Hypertension 06/18/2017  . Infection of AV graft for dialysis ([place]) 06/18/2017  . Anxiety 06/18/2017  . Anemia due to chronic kidney disease 06/18/2017  . Atypical atrial flutter ([place]) 06/18/2017  . Personality disorder ([place]) 06/13/2017  . Cellulitis 06/12/2017  . Adjustment disorder with mixed anxiety and depressed mood 06/10/2017  . Suicidal ideation 06/10/2017  . Arm wound, left, sequela 06/10/2017  . Dyspnea on exertion 05/29/2017  . Tachycardia 05/29/2017  . Hyperkalemia 05/22/2017  . Acute metabolic encephalopathy   . Anemia 04/23/2017  . Ascites 04/23/2017  . COPD (chronic obstructive pulmonary disease) ([place]) 04/23/2017  . Acute on chronic respiratory failure with hypoxia ([place]) 03/25/2017  . Arrhythmia 03/25/2017  . COPD GOLD 0 with flattening on inps f/v  09/27/2016  . Essential hypertension 09/27/2016  . Fluid overload 08/30/2016  . COPD exacerbation ([place]) 08/17/2016  . Hypertensive urgency 08/17/2016  . Problem with dialysis access ([place]) 07/23/2016  . Chronic hepatitis B ([place]) 03/05/2014  . Chronic hepatitis C without hepatic coma ([place]) 03/05/2014  . Internal hemorrhoids with bleeding, swelling and itching 03/05/2014  . Thrombocytopenia ([place]) 03/05/2014  . Chest pain 02/27/2014  . Alcohol abuse 04/14/2009  . Nicotine dependence, cigarettes, uncomplicated 29/93/7169  . GANGLION CYST 04/14/2009   Past Medical History:  Diagnosis Date  . Anemia   .  Anxiety   . Arthritis    left shoulder  . Atherosclerosis of aorta ([place])   . Cardiomegaly   . Chest pain    DATE UNKNOWN, C/O PERIODICALLY  . Cocaine abuse ([place])   . COPD exacerbation ([place]) 08/17/2016  . Coronary artery disease    stent 02/22/17  . Dysrhythmia   . ESRD (end stage renal disease) on dialysis ([place])    "[name]; MWF" (07/04/2017)  . GERD (gastroesophageal reflux disease)    DATE UNKNOWN  . Hemorrhoids   . Hepatitis B, chronic ([place])   . Hepatitis C   . History of kidney stones   . Hyperkalemia   . Hypertension   . Kidney failure   . Metabolic bone disease    Patient denies  . Mitral stenosis   . Myocardial infarction ([place])   . Pneumonia   . Pulmonary edema   . Solitary rectal ulcer syndrome 07/2017   at flex sig for rectal bleeding  . Tubular adenoma of colon  Family History  Problem Relation Age of Onset  . Heart disease Mother   . Lung cancer Mother   . Heart disease Father   . Malignant hyperthermia Father   . COPD Father   . Throat cancer Sister   . Esophageal cancer Sister   . Hypertension Other   . COPD Other   . Throat cancer Sister   . Colon cancer Neg Hx   . Colon polyps Neg Hx   . Rectal cancer Neg Hx   . Stomach cancer Neg Hx     Past Surgical History:  Procedure Laterality Date  . A/V FISTULAGRAM Left 05/26/2017   Procedure: A/V FISTULAGRAM;  Surgeon: Conrad Abbeville, MD;  Location: [facility];  Service: Cardiovascular;  Laterality: Left;  . A/V FISTULAGRAM Right 11/18/2017   Procedure: A/V FISTULAGRAM - Right Arm;  Surgeon: Elam Dutch, MD;  Location: [facility];  Service: Cardiovascular;  Laterality: Right;  . APPLICATION OF WOUND VAC Left 06/14/2017   Procedure: APPLICATION OF WOUND VAC;  Surgeon: Katha Cabal, MD;  Location: [facility];  Service: Vascular;  Laterality: Left;  . AV FISTULA PLACEMENT  2012   BELIEVED WAS PLACED IN JUNE  . AV FISTULA PLACEMENT Right 08/09/2017   Procedure: Creation Right  arm ARTERIOVENOUS BRACHIOCEPOHALIC FISTULA;  Surgeon: Elam Dutch, MD;  Location: [facility];  Service: Vascular;  Laterality: Right;  . AV FISTULA PLACEMENT Right 11/22/2017   Procedure: INSERTION OF ARTERIOVENOUS (AV) GORE-TEX GRAFT RIGHT UPPER ARM;  Surgeon: Elam Dutch, MD;  Location: [facility];  Service: Vascular;  Laterality: Right;  . BIOPSY  01/25/2018   Procedure: BIOPSY;  Surgeon: Jerene Bears, MD;  Location: [facility];  Service: Gastroenterology;;  . BIOPSY  04/10/2019   Procedure: BIOPSY;  Surgeon: Jerene Bears, MD;  Location: [facility];  Service: Gastroenterology;;  . COLONOSCOPY    . COLONOSCOPY WITH PROPOFOL N/A 01/25/2018   Procedure: COLONOSCOPY WITH PROPOFOL;  Surgeon: Jerene Bears, MD;  Location: [facility];  Service: Gastroenterology;  Laterality: N/A;  . CORONARY STENT INTERVENTION N/A 02/22/2017   Procedure: CORONARY STENT INTERVENTION;  Surgeon: Nigel Mormon, MD;  Location: [facility];  Service: Cardiovascular;  Laterality: N/A;  . ESOPHAGOGASTRODUODENOSCOPY (EGD) WITH PROPOFOL N/A 01/25/2018   Procedure: ESOPHAGOGASTRODUODENOSCOPY (EGD) WITH PROPOFOL;  Surgeon: Jerene Bears, MD;  Location: [facility];  Service: Gastroenterology;  Laterality: N/A;  . ESOPHAGOGASTRODUODENOSCOPY (EGD) WITH PROPOFOL N/A 04/10/2019   Procedure: ESOPHAGOGASTRODUODENOSCOPY (EGD) WITH PROPOFOL;  Surgeon: Jerene Bears, MD;  [facility];  Service: Gastroenterology;  Laterality: N/A;  . FLEXIBLE SIGMOIDOSCOPY N/A 07/15/2017   Procedure: FLEXIBLE SIGMOIDOSCOPY;  Surgeon: Carol Ada, MD;  Location: [facility];  Service: Endoscopy;  Laterality: N/A;  . HEMORRHOID BANDING    . I & D EXTREMITY Left 06/01/2017   Procedure: IRRIGATION AND DEBRIDEMENT LEFT ARM HEMATOMA WITH LIGATION OF LEFT ARM AV FISTULA;  Surgeon: Elam Dutch, MD;  Location: [facility];  Service: Vascular;  Laterality: Left;  . I & D EXTREMITY Left 06/14/2017   Procedure: IRRIGATION AND  DEBRIDEMENT EXTREMITY;  Surgeon: Katha Cabal, MD;  Location: [facility];  Service: Vascular;  Laterality: Left;  . INSERTION OF DIALYSIS CATHETER  05/30/2017  . INSERTION OF DIALYSIS CATHETER N/A 05/30/2017   Procedure: INSERTION OF DIALYSIS CATHETER;  Surgeon: Elam Dutch, MD;  Location: [facility];  Service: Vascular;  Laterality: N/A;  . IR PARACENTESIS  08/30/2017  . IR  PARACENTESIS  09/29/2017  . IR PARACENTESIS  10/28/2017  . IR PARACENTESIS  11/09/2017  . IR PARACENTESIS  11/16/2017  . IR PARACENTESIS  11/28/2017  . IR PARACENTESIS  12/01/2017  . IR PARACENTESIS  12/06/2017  . IR PARACENTESIS  01/03/2018  . IR PARACENTESIS  01/23/2018  . IR PARACENTESIS  02/07/2018  . IR PARACENTESIS  02/21/2018  . IR PARACENTESIS  03/06/2018  . IR PARACENTESIS  03/17/2018  . IR PARACENTESIS  04/04/2018  . IR PARACENTESIS  12/28/2018  . IR PARACENTESIS  01/08/2019  . IR PARACENTESIS  01/23/2019  . IR PARACENTESIS  02/01/2019  . IR PARACENTESIS  02/19/2019  . IR PARACENTESIS  03/01/2019  . IR PARACENTESIS  03/15/2019  . IR PARACENTESIS  04/03/2019  . IR PARACENTESIS  04/12/2019  . IR PARACENTESIS  05/01/2019  . IR PARACENTESIS  05/08/2019  . IR PARACENTESIS  05/24/2019  . IR PARACENTESIS  06/12/2019  . IR PARACENTESIS  07/09/2019  . IR PARACENTESIS  07/27/2019  . IR PARACENTESIS  08/09/2019  . IR PARACENTESIS  08/21/2019  . IR PARACENTESIS  09/17/2019  . IR PARACENTESIS  10/05/2019  . IR PARACENTESIS  10/29/2019  . IR PARACENTESIS  11/08/2019  . IR PARACENTESIS  12/12/2019  . IR PARACENTESIS  01/03/2020  . IR PARACENTESIS  01/10/2020  . IR PARACENTESIS  01/17/2020  . IR PARACENTESIS  01/24/2020  . IR PARACENTESIS  01/31/2020  . IR PARACENTESIS  02/07/2020  . IR PARACENTESIS  02/21/2020  . IR PARACENTESIS  05/21/2020  . IR RADIOLOGIST EVAL & MGMT  02/14/2018  . IR RADIOLOGIST EVAL & MGMT  02/22/2019  . LEFT HEART CATH AND CORONARY ANGIOGRAPHY N/A 02/22/2017   Procedure: LEFT HEART CATH AND CORONARY ANGIOGRAPHY;  Surgeon:  Nigel Mormon, MD;  Location: [facility];  Service: Cardiovascular;  Laterality: N/A;  . LIGATION OF ARTERIOVENOUS  FISTULA Left 12/16/2092   Procedure: Plication of Left Arm Arteriovenous Fistula;  Surgeon: Elam Dutch, MD;  Location: [facility];  Service: Vascular;  Laterality: Left;  . POLYPECTOMY    . POLYPECTOMY  01/25/2018   Procedure: POLYPECTOMY;  Surgeon: Jerene Bears, MD;  Location: [facility];  Service: Gastroenterology;;  . REVISON OF ARTERIOVENOUS FISTULA Left 01/17/6282   Procedure: PLICATION OF DISTAL ANEURYSMAL SEGEMENT OF LEFT UPPER ARM ARTERIOVENOUS FISTULA;  Surgeon: Elam Dutch, MD;  Location: [facility];  Service: Vascular;  Laterality: Left;  . REVISON OF ARTERIOVENOUS FISTULA Left 6/62/9476   Procedure: Plication of Left Upper Arm Fistula ;  Surgeon: Waynetta Sandy, MD;  Location: [facility];  Service: Vascular;  Laterality: Left;  . SKIN GRAFT SPLIT THICKNESS LEG / FOOT Left    SKIN GRAFT SPLIT THICKNESS LEFT ARM DONOR SITE: LEFT ANTERIOR THIGH  . SKIN SPLIT GRAFT Left 07/04/2017   Procedure: SKIN GRAFT SPLIT THICKNESS LEFT ARM DONOR SITE: LEFT ANTERIOR THIGH;  Surgeon: Elam Dutch, MD;  Location: [facility];  Service: Vascular;  Laterality: Left;  . THROMBECTOMY W/ EMBOLECTOMY Left 06/05/2017   Procedure: EXPLORATION OF LEFT ARM FOR BLEEDING; OVERSEWED PROXIMAL FISTULA;  Surgeon: Angelia Mould, MD;  Location: [facility];  Service: Vascular;  Laterality: Left;  . WOUND EXPLORATION Left 06/03/2017   Procedure: WOUND EXPLORATION WITH WOUND VAC APPLICATION TO LEFT ARM;  Surgeon: Angelia Mould, MD;  Location: [facility];  Service: Vascular;  Laterality: Left;   Social History   Occupational History  . Occupation: Unemployed  Tobacco Use  . Smoking status: Current  Every Day Smoker    Packs/day: 1.00    Years: 43.00    Pack years: 43.00    Types: Cigarettes    Start date: 08/13/1973  . Smokeless tobacco: Never Used  Vaping Use  . Vaping Use:  Never used  Substance and Sexual Activity  . Alcohol use: Not Currently    Comment: quit drinking in 2017  . Drug use: Yes    Types: Marijuana, Cocaine    Comment: reports using once every 3 months,  Quit 04-06-2019  . Sexual activity: Not Currently    Birth control/protection: None

## 2020-08-20 IMAGING — US IR PARACENTESIS
1 series · 4 of 4 positions shown · non-contrast
Comparison: none

INDICATION: Ascites secondary to alcoholic cirrhosis. Request for diagnostic and
therapeutic paracentesis.

[Series 1: ir (id) (id)/(id)/(id) ir · 4 of 4 slices shown]
[im 1/4]
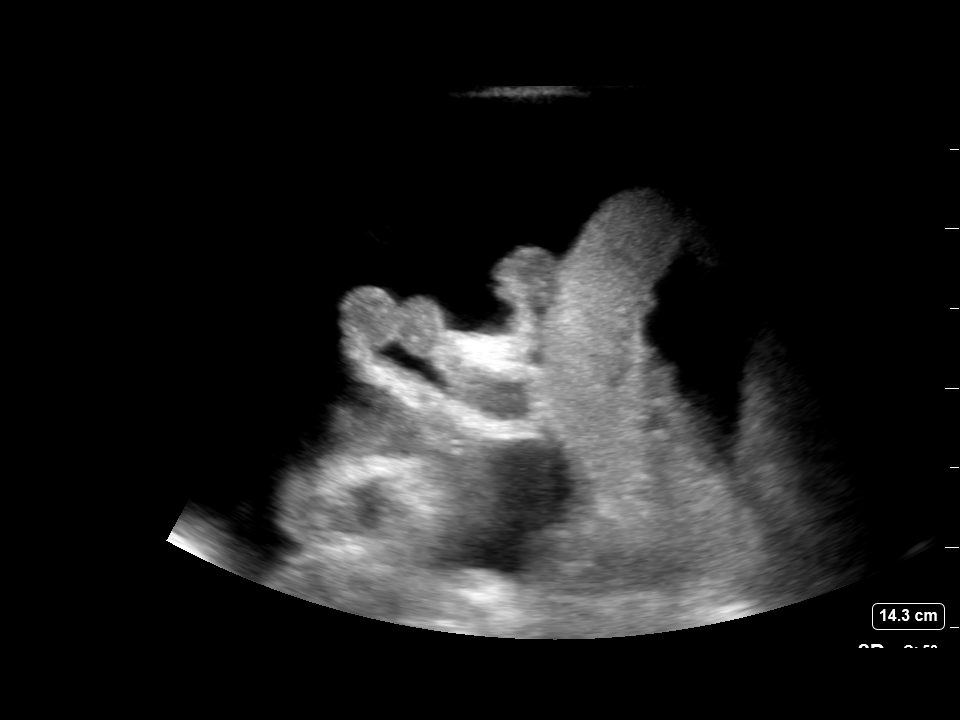
[im 2/4]
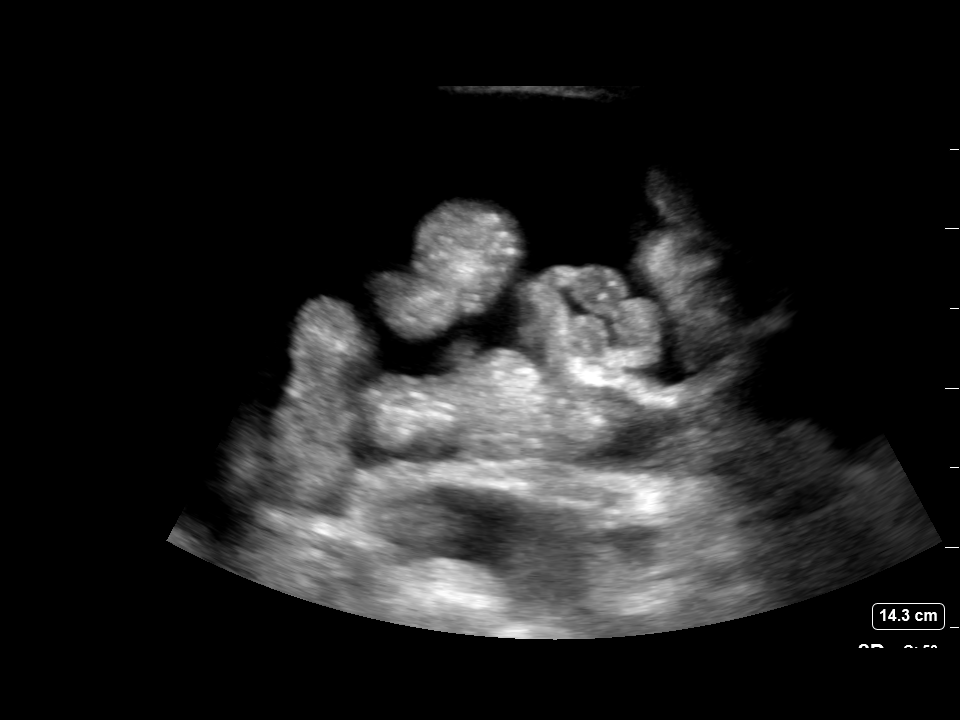
[im 3/4]
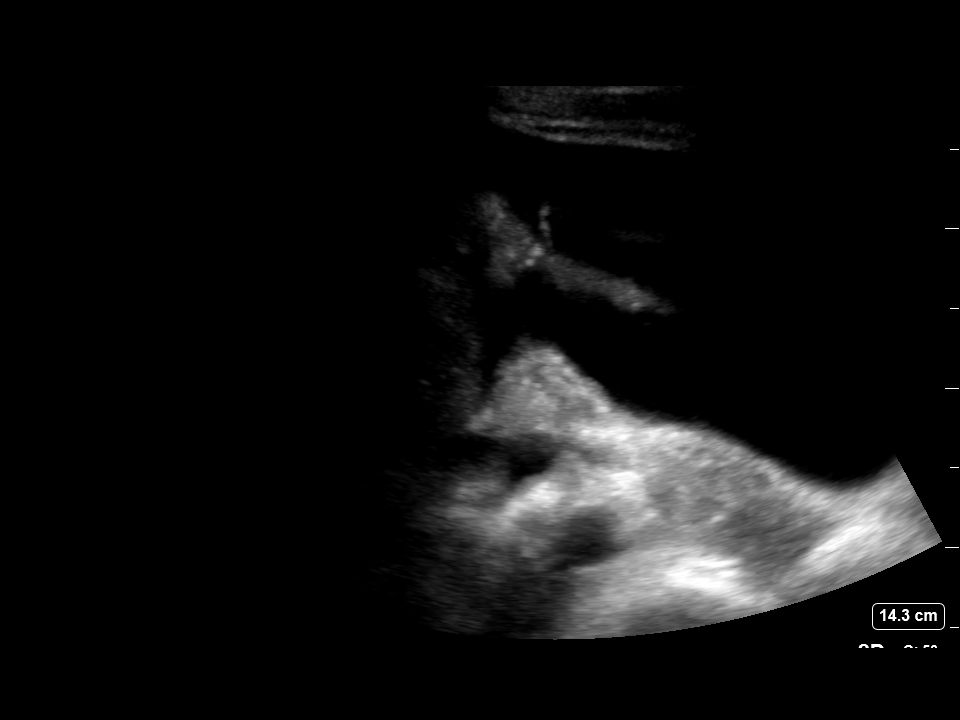
[im 4/4]
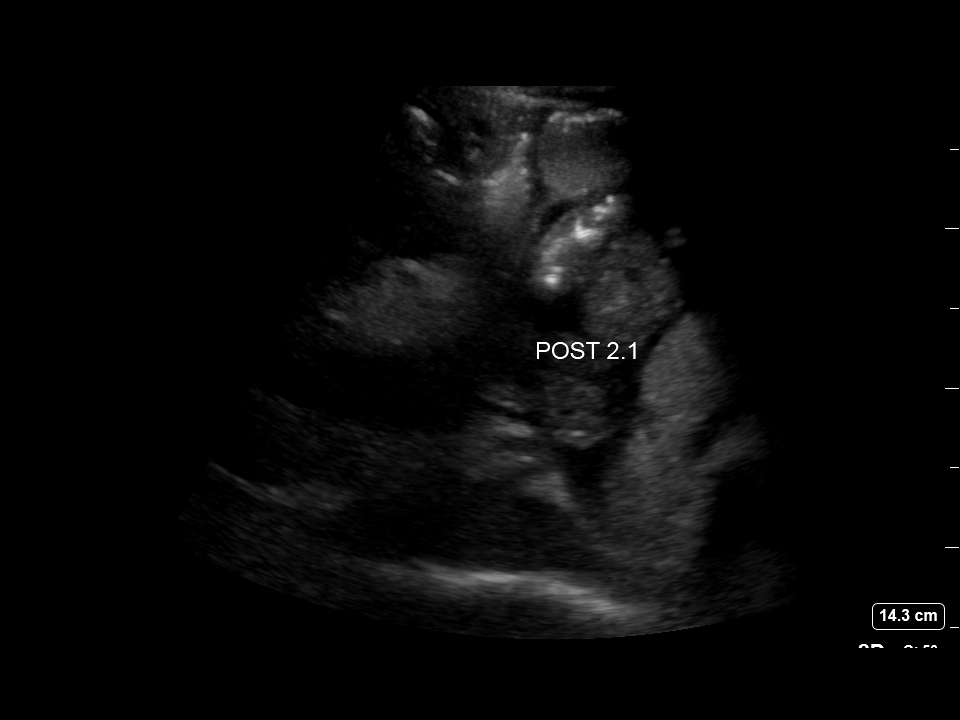

[4 of 4 positions shown; findings below may reference images not displayed]

EXAM:
ULTRASOUND GUIDED PARACENTESIS

MEDICATIONS:
% lidocaine 15 mL

COMPLICATIONS:
None immediate.

PROCEDURE:
Informed written consent was obtained from the patient after a
discussion of the risks, benefits and alternatives to treatment. A
timeout was performed prior to the initiation of the procedure.

Initial ultrasound scanning demonstrates a moderate amount of
ascites within the left lower abdominal quadrant. The left lower
abdomen was prepped and draped in the usual sterile fashion. 1%
lidocaine with epinephrine was used for local anesthesia.

Following this, a 19 gauge, 7-cm, Yueh catheter was introduced. An
ultrasound image was saved for documentation purposes. The
paracentesis was performed. The catheter was removed and a dressing
was applied. The patient tolerated the procedure well without
immediate post procedural complication.
FINDINGS: A total of approximately 2.1 L of clear yellow fluid was removed.
Samples were sent to the laboratory as requested by the clinical
team.
IMPRESSION: Successful ultrasound-guided paracentesis yielding 2.1 liters of
peritoneal fluid.

## 2020-08-28 ENCOUNTER — Other Ambulatory Visit: Payer: Self-pay | Admitting: Family Medicine

## 2020-08-28 DIAGNOSIS — K219 Gastro-esophageal reflux disease without esophagitis: Secondary | ICD-10-CM

## 2020-08-28 DIAGNOSIS — R110 Nausea: Secondary | ICD-10-CM

## 2020-08-28 NOTE — Telephone Encounter (Signed)
Requested medication (s) are due for refill today: yes  Requested medication (s) are on the active medication list: yes  Last refill:  08/18/20  Future visit scheduled: yes  Notes to clinic:  not delegated    Requested Prescriptions  Pending Prescriptions Disp Refills   ondansetron (ZOFRAN) 4 MG tablet [Pharmacy Med Name: ONDANSETRON HCL 4 MG TABLET] 30 tablet 3    Sig: TAKE 1 TABLET BY MOUTH EVERY 8 HOURS AS NEEDED FOR NAUSEA AND VOMITING      Not Delegated - Gastroenterology: Antiemetics Failed - 08/28/2020 12:12 PM      Failed - This refill cannot be delegated      Passed - Valid encounter within last 6 months    Recent Outpatient Visits           3 weeks ago Felon of finger of right hand   [place], Charlane Ferretti, MD   1 month ago Gross hematuria   [place], Charlane Ferretti, MD   2 months ago AF (paroxysmal atrial fibrillation) [facility])   [place], Enobong, MD       Future Appointments             In 2 weeks [place], Gerline Legacy, PA-C [facility]   In 4 weeks Charlott Rakes, MD [place]

## 2020-08-30 IMAGING — US IR PARACENTESIS
1 series · 3 of 3 positions shown · non-contrast
Comparison: Previous paracentesis.

MEDICATIONS:
10 cc 1% lidocaine

COMPLICATIONS:
None immediate.

INDICATION: Recurrent ascites

EXAM:
ULTRASOUND-GUIDED PARACENTESIS
TECHNIQUE: Informed written consent was obtained from the patient after a
discussion of the risks, benefits and alternatives to treatment. A
timeout was performed prior to the initiation of the procedure.

[Series 1: ir (id) (id)/(id)/(id) ir · 3 of 3 slices shown]
[im 1/3]
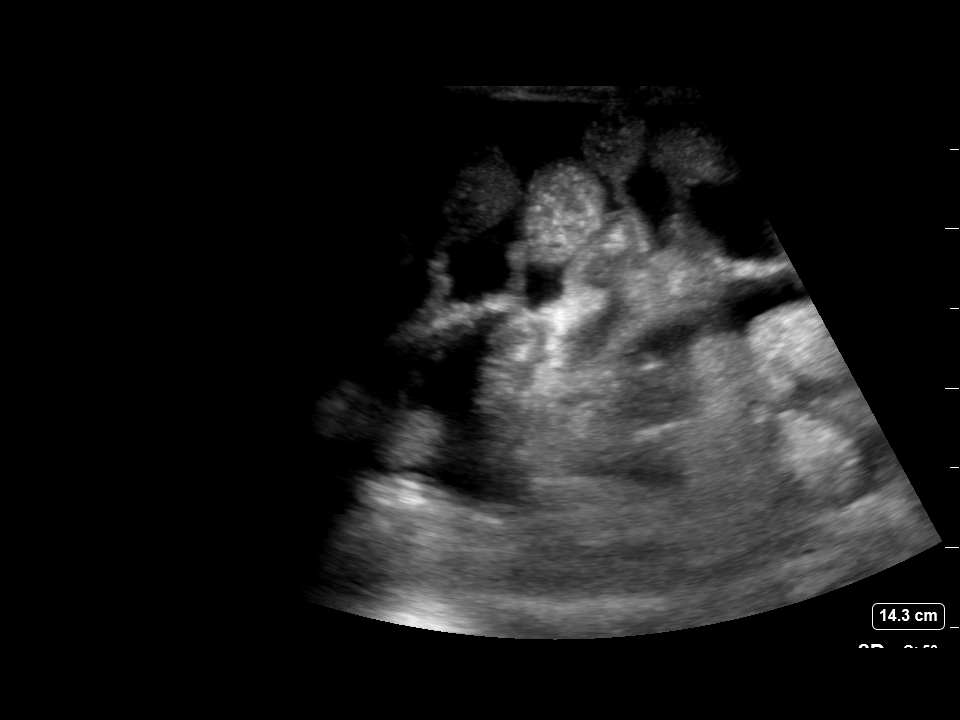
[im 2/3]
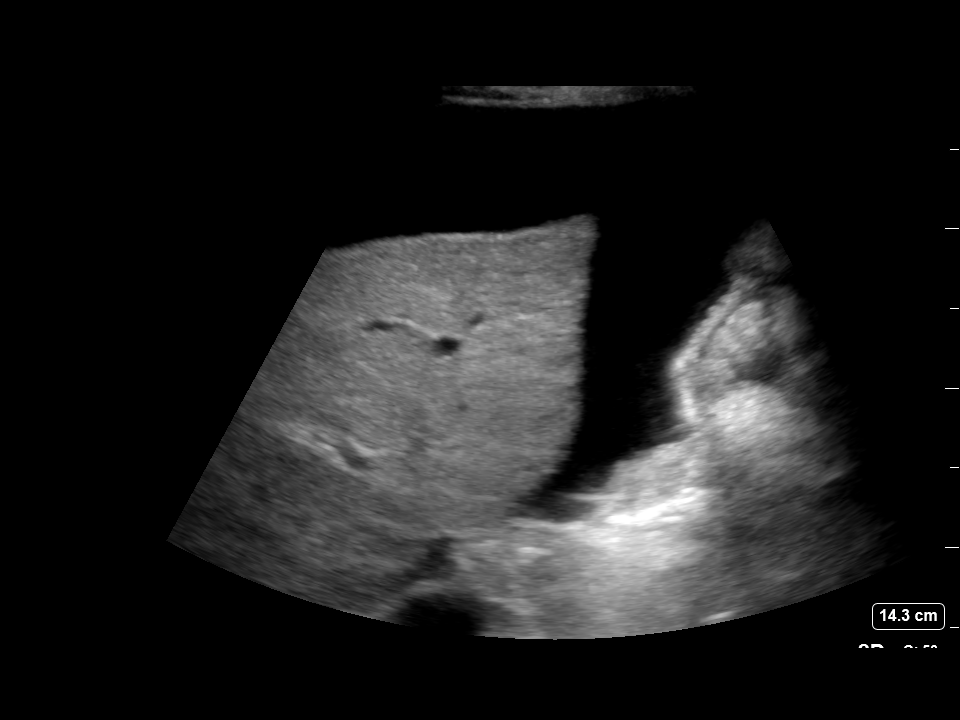
[im 3/3]
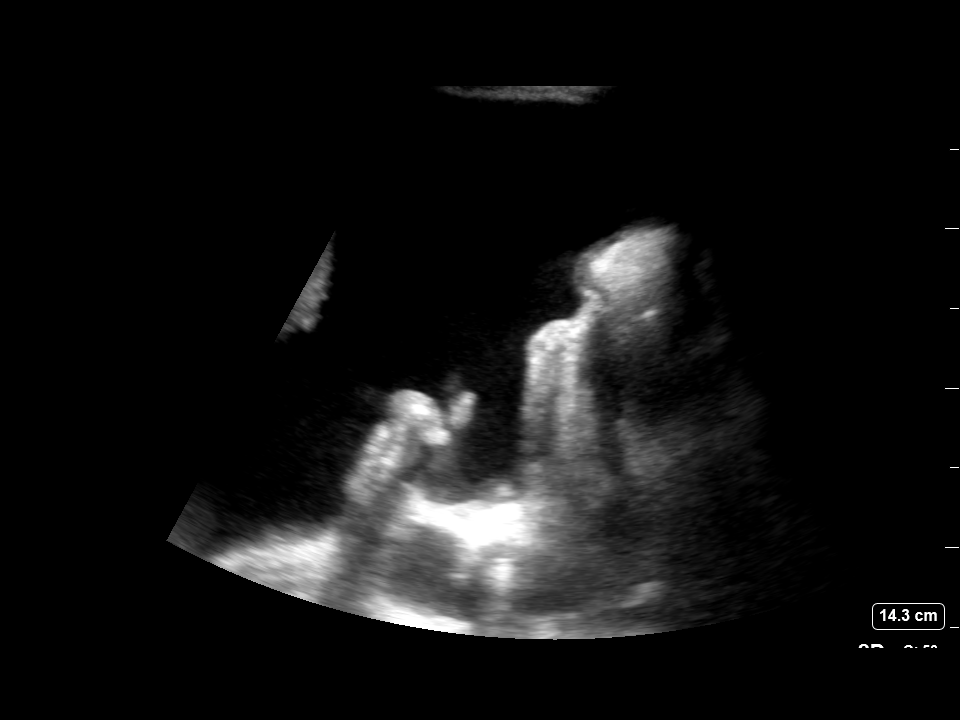

[3 of 3 positions shown; findings below may reference images not displayed]

Initial ultrasound scanning demonstrates a large amount of ascites
within the right lower abdominal quadrant. The right lower abdomen
was prepped and draped in the usual sterile fashion. 1% lidocaine
with epinephrine was used for local anesthesia. Under direct
ultrasound guidance, a 19 gauge, 7-cm, Yueh catheter was introduced.
An ultrasound image was saved for documentation purposed. The
paracentesis was performed. The catheter was removed and a dressing
was applied. The patient tolerated the procedure well without
immediate post procedural complication.
FINDINGS: A total of approximately 4 liters of light amber fluid was removed.
IMPRESSION: Successful ultrasound-guided paracentesis yielding 4 liters of
peritoneal fluid.

Read by

Patre Tim

## 2020-09-02 ENCOUNTER — Ambulatory Visit: Payer: Medicare Other | Admitting: Family Medicine

## 2020-09-02 ENCOUNTER — Other Ambulatory Visit: Payer: Self-pay | Admitting: Orthopedic Surgery

## 2020-09-03 ENCOUNTER — Other Ambulatory Visit: Payer: Self-pay

## 2020-09-03 ENCOUNTER — Encounter (HOSPITAL_COMMUNITY): Payer: Self-pay | Admitting: Orthopedic Surgery

## 2020-09-03 ENCOUNTER — Other Ambulatory Visit (HOSPITAL_COMMUNITY)
Admission: RE | Admit: 2020-09-03 | Discharge: 2020-09-03 | Disposition: A | Discharge status: Home / Self Care | Payer: Medicare Other | Source: Ambulatory Visit | Attending: Orthopedic Surgery | Admitting: Orthopedic Surgery

## 2020-09-03 DIAGNOSIS — Z01812 Encounter for preprocedural laboratory examination: Secondary | ICD-10-CM | POA: Diagnosis present

## 2020-09-03 DIAGNOSIS — Z20822 Contact with and (suspected) exposure to covid-19: Secondary | ICD-10-CM | POA: Diagnosis not present

## 2020-09-03 LAB — SARS CORONAVIRUS 2 (TAT 6-24 HRS): SARS Coronavirus 2: NEGATIVE

## 2020-09-03 NOTE — Anesthesia Preprocedure Evaluation (Addendum)
Anesthesia Evaluation  Patient identified by MRN, date of birth, ID band Patient awake    Reviewed: Allergy & Precautions, H&P , NPO status , Patient's Chart, lab work & pertinent test results, reviewed documented beta blocker date and time   Airway Mallampati: II  TM Distance: >3 FB Neck ROM: Full    Dental no notable dental hx. (+) Teeth Intact, Dental Advisory Given   Pulmonary COPD,  COPD inhaler, Current Smoker,    Pulmonary exam normal breath sounds clear to auscultation       Cardiovascular hypertension, Pt. on medications and Pt. on home beta blockers + CAD, + Past MI and + Cardiac Stents  + dysrhythmias Atrial Fibrillation  Rhythm:Regular Rate:Normal     Neuro/Psych Anxiety negative neurological ROS     GI/Hepatic PUD, GERD  Medicated,(+) Hepatitis -, B, C  Endo/Other  negative endocrine ROS  Renal/GU ESRF and DialysisRenal disease  negative genitourinary   Musculoskeletal  (+) Arthritis , Osteoarthritis,    Abdominal   Peds  Hematology  (+) Blood dyscrasia, anemia ,   Anesthesia Other Findings   Reproductive/Obstetrics negative OB ROS                           Anesthesia Physical Anesthesia Plan  ASA: III  Anesthesia Plan: General   Post-op Pain Management:    Induction: Intravenous  PONV Risk Score and Plan: 2 and Midazolam and Ondansetron  Airway Management Planned: Oral ETT  Additional Equipment:   Intra-op Plan:   Post-operative Plan: Extubation in OR  Informed Consent: I have reviewed the patients History and Physical, chart, labs and discussed the procedure including the risks, benefits and alternatives for the proposed anesthesia with the patient or authorized representative who has indicated his/her understanding and acceptance.     Dental advisory given  Plan Discussed with: CRNA  Anesthesia Plan Comments: (PAT note by Karoline Caldwell, PA-C: Follows with  cardiologist Dr. Einar Gip for history of left bundle branch block, severe COPD and chronic cor pulmonale with pulmonary hypertension, coronary artery disease s/p stenting to mid RCA in 2018, paroxysmal atrial fibrillation with RVR, hypertensive cardiomyopathy, dietary and medication noncompliance.  History of multiple ER visits and hospitalizations for A. fib with RVR, syncope, shortness of breath and hyperkalemia due to missed dialysis.  Most recent hospitalization November 2021 secondary to missed dialysis.  Last seen by cardiology 07/22/2020, he was feeling well at that time, without clinical signs of heart failure.  He was in A. fib with controlled ventricular response.  Recommended 6-week follow-up.  Chronic hepatitis B and C with cirrhosis of the liver and ascites undergoing intermittent paracentesis. Not on anticoagulation due to cirrhosis of liver.  ESRD on HD Monday Wednesday Friday.  He will need day of surgery labs and evaluation.  EKG 07/22/2020: Atrial fibrillation.  Ventricular rate 86.  Left bundle branch block.  PORTABLE CHEST 1 VIEW 06/30/2020: COMPARISON:  Chest radiograph dated 06/16/2020.  FINDINGS: Mild eventration of the left hemidiaphragm. There are bibasilar densities, likely atelectasis. No focal consolidation, pleural effusion, pneumothorax. There is mild cardiomegaly. Atherosclerotic calcification of the aorta. No acute osseous pathology.  IMPRESSION: 1. No acute cardiopulmonary process. 2. Mild cardiomegaly.  Spirometry 10/13/2017:  Ref. Range 10/13/2017 09:20 FVC-Pre Latest Units: L 2.90 FVC-%Pred-Pre Latest Units: % 73 FEV1-Pre Latest Units: L 2.02 FEV1-%Pred-Pre Latest Units: % 64 Pre FEV1/FVC ratio Latest Units: % 70 FEV1FVC-%Pred-Pre Latest Units: % 88 FEF 25-75 Pre Latest Units: L/sec 1.27 FEF2575-%Pred-Pre  Latest Units: % 41 FEV6-Pre Latest Units: L 2.88 FEV6-%Pred-Pre Latest Units: % 75 Pre FEV6/FVC Ratio Latest Units: % 99 FEV6FVC-%Pred-Pre Latest  Units: % 102   TTE 02/05/2020: 1. Left ventricular ejection fraction, by estimation, is 50 to 55%. The  left ventricle has normal function. There is severe left ventricular  hypertrophy. Left ventricular diastolic function could not be evaluated. I  cannot exclude septal hypokinesis.  Endocardium not well visualized. septal motion also is consistent with  LBBB.  2. Right ventricular systolic function is mildly reduced. The right  ventricular size is moderately enlarged. Severely increased right  ventricular wall thickness. There is moderately elevated pulmonary artery  systolic pressure. The estimated right  ventricular systolic pressure is 86.7 mmHg.  3. Left atrial size was severely dilated.  4. Right atrial size was severely dilated.  5. The mitral valve is normal in structure. Mild mitral valve  regurgitation.  6. Dilated TV annulus. Tricuspid valve regurgitation is moderate to  severe.  7. AV sclerosis without stenosis. The aortic valve is tricuspid. Aortic  valve regurgitation is trivial. Mild aortic valve sclerosis is present,  with no evidence of aortic valve stenosis. Aortic valve mean gradient  measures 8.3 mmHg.  8. The inferior vena cava is dilated in size with <50% respiratory  variability, suggesting right atrial pressure of 15 mmHg.   Cath and PCI 02/22/2017: NSTEMI Severe single vessel coronary artery disease with high grade prox RCA stenosis Successful PTCA and stent placement Xience Alpine 3.5 X 23 mm 0% residual stenosis. TIMI III flow.  Elevated right atrial pressures Severe pulmonary hypertension, likely WHO Grp II Elevated LVEDP  Recommendations: In view of his allergy to ASA, and increased bleeding risk with triple therapy, would recommend clopidogrel 75 mg daily for at least 6 months Also, given his Afib and high stroke risk (CHA2DS2-Vasc score 3), recommend starting on warfarin.  )       Anesthesia Quick Evaluation

## 2020-09-03 NOTE — Progress Notes (Signed)
Anesthesia Chart Review: Same day workup  Follows with cardiologist Dr. Einar Gip for history of left bundle branch block, severe COPD and chronic cor pulmonale with pulmonary hypertension, coronary artery disease s/p stenting to mid RCA in 2018, paroxysmal atrial fibrillation with RVR, hypertensive cardiomyopathy, dietary and medication noncompliance.  History of multiple ER visits and hospitalizations for A. fib with RVR, syncope, shortness of breath and hyperkalemia due to missed dialysis.  Most recent hospitalization November 2021 secondary to missed dialysis.  Last seen by cardiology 07/22/2020, he was feeling well at that time, without clinical signs of heart failure.  He was in A. fib with controlled ventricular response.  Recommended 6-week follow-up.  Chronic hepatitis B and C with cirrhosis of the liver and ascites undergoing intermittent paracentesis. Not on anticoagulation due to cirrhosis of liver.  ESRD on HD Monday Wednesday Friday.  He will need day of surgery labs and evaluation.  EKG 07/22/2020: Atrial fibrillation.  Ventricular rate 86.  Left bundle branch block.  PORTABLE CHEST 1 VIEW 06/30/2020: COMPARISON:  Chest radiograph dated 06/16/2020.  FINDINGS: Mild eventration of the left hemidiaphragm. There are bibasilar densities, likely atelectasis. No focal consolidation, pleural effusion, pneumothorax. There is mild cardiomegaly. Atherosclerotic calcification of the aorta. No acute osseous pathology.  IMPRESSION: 1. No acute cardiopulmonary process. 2. Mild cardiomegaly.  Spirometry 10/13/2017:  Ref. Range 10/13/2017 09:20  FVC-Pre Latest Units: L 2.90  FVC-%Pred-Pre Latest Units: % 73  FEV1-Pre Latest Units: L 2.02  FEV1-%Pred-Pre Latest Units: % 64  Pre FEV1/FVC ratio Latest Units: % 70  FEV1FVC-%Pred-Pre Latest Units: % 88  FEF 25-75 Pre Latest Units: L/sec 1.27  FEF2575-%Pred-Pre Latest Units: % 41  FEV6-Pre Latest Units: L 2.88  FEV6-%Pred-Pre Latest Units: % 75   Pre FEV6/FVC Ratio Latest Units: % 99  FEV6FVC-%Pred-Pre Latest Units: % 102    TTE 02/05/2020: 1. Left ventricular ejection fraction, by estimation, is 50 to 55%. The  left ventricle has normal function. There is severe left ventricular  hypertrophy. Left ventricular diastolic function could not be evaluated. I  cannot exclude septal hypokinesis.  Endocardium not well visualized. septal motion also is consistent with  LBBB.  2. Right ventricular systolic function is mildly reduced. The right  ventricular size is moderately enlarged. Severely increased right  ventricular wall thickness. There is moderately elevated pulmonary artery  systolic pressure. The estimated right  ventricular systolic pressure is 09.6 mmHg.  3. Left atrial size was severely dilated.  4. Right atrial size was severely dilated.  5. The mitral valve is normal in structure. Mild mitral valve  regurgitation.  6. Dilated TV annulus. Tricuspid valve regurgitation is moderate to  severe.  7. AV sclerosis without stenosis. The aortic valve is tricuspid. Aortic  valve regurgitation is trivial. Mild aortic valve sclerosis is present,  with no evidence of aortic valve stenosis. Aortic valve mean gradient  measures 8.3 mmHg.  8. The inferior vena cava is dilated in size with <50% respiratory  variability, suggesting right atrial pressure of 15 mmHg.   Cath and PCI 02/22/2017: NSTEMI Severe single vessel coronary artery disease with high grade prox RCA stenosis Successful PTCA and stent placement Xience Alpine 3.5 X 23 mm 0% residual stenosis. TIMI III flow.  Elevated right atrial pressures Severe pulmonary hypertension, likely WHO Grp II Elevated LVEDP  Recommendations: In view of his allergy to ASA, and increased bleeding risk with triple therapy, would recommend clopidogrel 75 mg daily for at least 6 months Also, given his Afib and high stroke  risk (CHA2DS2-Vasc score 3), recommend starting on  warfarin.    Wynonia Musty [facility]/Anesthesiology Phone [phone] 09/03/2020 4:00 PM

## 2020-09-03 NOTE — Progress Notes (Signed)
PCP - Dr. Margarita Rana Cardiologist - Dr. Maryjean Ka  Chest x-ray -  EKG - 07/22/20 Stress Test - denies ECHO - 02/05/20 Cardiac Cath - 02/22/17   ERAS Protcol - clears until 11:30AM  COVID TEST- 09/04/31  Anesthesia review: yes   -------------  SDW INSTRUCTIONS:  Your procedure is scheduled on 09/04/20. Please report to [name] Main Entrance "A" at 12:00 P.M., and check in at the Admitting office. Call this number if you have problems the morning of surgery: [phone]   Remember: Do not eat midnight the night before your surgery  You may drink clear liquids until 11:30 A.M. the morning of your surgery.   Clear liquids allowed are: Water, Non-Citrus Juices (without pulp), Carbonated Beverages, Clear Tea, Black Coffee Only, and Gatorade   Medications to take morning of surgery with a sip of water include: Inhaler --- Please bring all inhalers with you the day of surgery.  Amiodarone Zyrtec Diltiazem Gabapentin Metoprolol Zofran Oxycodone - PRN Protonix  As of today, STOP taking any Aspirin (unless otherwise instructed by your surgeon), Aleve, Naproxen, Ibuprofen, Motrin, Advil, Goody's, BC's, all herbal medications, fish oil, and all vitamins.    The Morning of Surgery Do not wear jewelry Do not wear lotions, powders, colognes, or deodorant Men may shave face and neck. Do not bring valuables to the hospital. [facility] is not responsible for any belongings or valuables. If you are a smoker, DO NOT Smoke 24 hours prior to surgery If you wear a CPAP at night please bring your mask the morning of surgery  Remember that you must have someone to transport you home after your surgery, and remain with you for 24 hours if you are discharged the same day. Please bring cases for contacts, glasses, hearing aids, dentures or bridgework because it cannot be worn into surgery.   Patients discharged the day of surgery will not be allowed to drive home.   Please shower the NIGHT BEFORE  SURGERY and the MORNING OF SURGERY with DIAL Soap. Wear comfortable clothes the morning of surgery. Oral Hygiene is also important to reduce your risk of infection.  Remember - BRUSH YOUR TEETH THE MORNING OF SURGERY WITH YOUR REGULAR TOOTHPASTE  Patient denies shortness of breath, fever, cough and chest pain.

## 2020-09-04 ENCOUNTER — Ambulatory Visit (HOSPITAL_COMMUNITY)
Admission: RE | Admit: 2020-09-04 | Discharge: 2020-09-04 | Disposition: A | Discharge status: Home / Self Care | Payer: Medicare Other | Source: Home / Self Care | Attending: Orthopedic Surgery | Admitting: Orthopedic Surgery

## 2020-09-04 ENCOUNTER — Ambulatory Visit (HOSPITAL_COMMUNITY): Payer: Medicare Other | Admitting: Physician Assistant

## 2020-09-04 ENCOUNTER — Ambulatory Visit (HOSPITAL_COMMUNITY): Payer: Medicare Other

## 2020-09-04 ENCOUNTER — Ambulatory Visit: Payer: Medicare Other | Admitting: Vascular Surgery

## 2020-09-04 ENCOUNTER — Encounter (HOSPITAL_COMMUNITY)
Admission: RE | Disposition: A | Discharge status: Home / Self Care | Payer: Self-pay | Source: Home / Self Care | Attending: Orthopedic Surgery

## 2020-09-04 ENCOUNTER — Encounter (HOSPITAL_COMMUNITY): Payer: Self-pay | Admitting: Orthopedic Surgery

## 2020-09-04 DIAGNOSIS — N186 End stage renal disease: Secondary | ICD-10-CM | POA: Insufficient documentation

## 2020-09-04 DIAGNOSIS — Z992 Dependence on renal dialysis: Secondary | ICD-10-CM | POA: Insufficient documentation

## 2020-09-04 DIAGNOSIS — I2510 Atherosclerotic heart disease of native coronary artery without angina pectoris: Secondary | ICD-10-CM | POA: Insufficient documentation

## 2020-09-04 DIAGNOSIS — Z885 Allergy status to narcotic agent status: Secondary | ICD-10-CM | POA: Insufficient documentation

## 2020-09-04 DIAGNOSIS — Z79899 Other long term (current) drug therapy: Secondary | ICD-10-CM | POA: Insufficient documentation

## 2020-09-04 DIAGNOSIS — I082 Rheumatic disorders of both aortic and tricuspid valves: Secondary | ICD-10-CM | POA: Insufficient documentation

## 2020-09-04 DIAGNOSIS — I480 Paroxysmal atrial fibrillation: Secondary | ICD-10-CM | POA: Insufficient documentation

## 2020-09-04 DIAGNOSIS — Z886 Allergy status to analgesic agent status: Secondary | ICD-10-CM | POA: Insufficient documentation

## 2020-09-04 DIAGNOSIS — T391X1A Poisoning by 4-Aminophenol derivatives, accidental (unintentional), initial encounter: Secondary | ICD-10-CM | POA: Diagnosis not present

## 2020-09-04 DIAGNOSIS — I120 Hypertensive chronic kidney disease with stage 5 chronic kidney disease or end stage renal disease: Secondary | ICD-10-CM | POA: Insufficient documentation

## 2020-09-04 DIAGNOSIS — Z888 Allergy status to other drugs, medicaments and biological substances status: Secondary | ICD-10-CM | POA: Insufficient documentation

## 2020-09-04 DIAGNOSIS — I96 Gangrene, not elsewhere classified: Secondary | ICD-10-CM | POA: Insufficient documentation

## 2020-09-04 DIAGNOSIS — I252 Old myocardial infarction: Secondary | ICD-10-CM | POA: Insufficient documentation

## 2020-09-04 DIAGNOSIS — I272 Pulmonary hypertension, unspecified: Secondary | ICD-10-CM | POA: Insufficient documentation

## 2020-09-04 DIAGNOSIS — Z955 Presence of coronary angioplasty implant and graft: Secondary | ICD-10-CM | POA: Insufficient documentation

## 2020-09-04 DIAGNOSIS — M869 Osteomyelitis, unspecified: Secondary | ICD-10-CM | POA: Insufficient documentation

## 2020-09-04 DIAGNOSIS — F17210 Nicotine dependence, cigarettes, uncomplicated: Secondary | ICD-10-CM | POA: Insufficient documentation

## 2020-09-04 DIAGNOSIS — J449 Chronic obstructive pulmonary disease, unspecified: Secondary | ICD-10-CM | POA: Insufficient documentation

## 2020-09-04 DIAGNOSIS — I447 Left bundle-branch block, unspecified: Secondary | ICD-10-CM | POA: Insufficient documentation

## 2020-09-04 HISTORY — PX: AMPUTATION: SHX166

## 2020-09-04 LAB — POCT I-STAT, CHEM 8
BUN: 55 mg/dL — ABNORMAL HIGH (ref 6–20)
Calcium, Ion: 1.09 mmol/L — ABNORMAL LOW (ref 1.15–1.40)
Chloride: 97 mmol/L — ABNORMAL LOW (ref 98–111)
Creatinine, Ser: 9.4 mg/dL — ABNORMAL HIGH (ref 0.61–1.24)
Glucose, Bld: 69 mg/dL — ABNORMAL LOW (ref 70–99)
HCT: 37 % — ABNORMAL LOW (ref 39.0–52.0)
Hemoglobin: 12.6 g/dL — ABNORMAL LOW (ref 13.0–17.0)
Potassium: 5.3 mmol/L — ABNORMAL HIGH (ref 3.5–5.1)
Sodium: 135 mmol/L (ref 135–145)
TCO2: 20 mmol/L — ABNORMAL LOW (ref 22–32)

## 2020-09-04 SURGERY — AMPUTATION DIGIT
Anesthesia: General | Site: Hand | Laterality: Right

## 2020-09-04 MED ORDER — SODIUM CHLORIDE 0.9 % IV SOLN: INTRAVENOUS | Status: DC

## 2020-09-04 MED ORDER — PHENYLEPHRINE 40 MCG/ML (10ML) SYRINGE FOR IV PUSH (FOR BLOOD PRESSURE SUPPORT)
PREFILLED_SYRINGE | INTRAVENOUS | Status: AC
  Filled 2020-09-04: qty 10

## 2020-09-04 MED ORDER — KETAMINE HCL 10 MG/ML IJ SOLN
INTRAMUSCULAR | Status: DC | PRN
  Administered 2020-09-04: 10 mg via INTRAVENOUS
  Administered 2020-09-04: 20 mg via INTRAVENOUS

## 2020-09-04 MED ORDER — PROPOFOL 10 MG/ML IV BOLUS
INTRAVENOUS | Status: DC | PRN
  Administered 2020-09-04 (×2): 160 mg via INTRAVENOUS

## 2020-09-04 MED ORDER — CEFAZOLIN SODIUM-DEXTROSE 2-4 GM/100ML-% IV SOLN
2.0000 g | INTRAVENOUS | Status: AC
  Administered 2020-09-04: 2 g via INTRAVENOUS

## 2020-09-04 MED ORDER — KETAMINE HCL 50 MG/5ML IJ SOSY
PREFILLED_SYRINGE | INTRAMUSCULAR | Status: AC
  Filled 2020-09-04: qty 5

## 2020-09-04 MED ORDER — LIDOCAINE 2% (20 MG/ML) 5 ML SYRINGE
INTRAMUSCULAR | Status: AC
  Filled 2020-09-04: qty 5

## 2020-09-04 MED ORDER — FENTANYL CITRATE (PF) 100 MCG/2ML IJ SOLN
INTRAMUSCULAR | Status: DC | PRN
  Administered 2020-09-04 (×2): 50 ug via INTRAVENOUS

## 2020-09-04 MED ORDER — FENTANYL CITRATE (PF) 250 MCG/5ML IJ SOLN
INTRAMUSCULAR | Status: AC
  Filled 2020-09-04: qty 5

## 2020-09-04 MED ORDER — SUGAMMADEX SODIUM 200 MG/2ML IV SOLN
INTRAVENOUS | Status: DC | PRN
  Administered 2020-09-04: 50 mg via INTRAVENOUS
  Administered 2020-09-04: 150 mg via INTRAVENOUS

## 2020-09-04 MED ORDER — EPHEDRINE 5 MG/ML INJ
INTRAVENOUS | Status: AC
  Filled 2020-09-04: qty 10

## 2020-09-04 MED ORDER — DEXAMETHASONE SODIUM PHOSPHATE 10 MG/ML IJ SOLN
INTRAMUSCULAR | Status: AC
  Filled 2020-09-04: qty 1

## 2020-09-04 MED ORDER — BUPIVACAINE HCL (PF) 0.25 % IJ SOLN
INTRAMUSCULAR | Status: AC
  Filled 2020-09-04: qty 30

## 2020-09-04 MED ORDER — CHLORHEXIDINE GLUCONATE 0.12 % MT SOLN: 15.0000 mL | Freq: Once | OROMUCOSAL | Status: AC

## 2020-09-04 MED ORDER — PHENYLEPHRINE 40 MCG/ML (10ML) SYRINGE FOR IV PUSH (FOR BLOOD PRESSURE SUPPORT)
PREFILLED_SYRINGE | INTRAVENOUS | Status: DC | PRN
  Administered 2020-09-04 (×2): 120 ug via INTRAVENOUS
  Administered 2020-09-04: 160 ug via INTRAVENOUS

## 2020-09-04 MED ORDER — LIDOCAINE 2% (20 MG/ML) 5 ML SYRINGE
INTRAMUSCULAR | Status: DC | PRN
  Administered 2020-09-04: 40 mg via INTRAVENOUS

## 2020-09-04 MED ORDER — EPHEDRINE SULFATE-NACL 50-0.9 MG/10ML-% IV SOSY
PREFILLED_SYRINGE | INTRAVENOUS | Status: DC | PRN
  Administered 2020-09-04: 10 mg via INTRAVENOUS
  Administered 2020-09-04: 5 mg via INTRAVENOUS

## 2020-09-04 MED ORDER — ONDANSETRON HCL 4 MG/2ML IJ SOLN
INTRAMUSCULAR | Status: DC | PRN
  Administered 2020-09-04: 4 mg via INTRAVENOUS

## 2020-09-04 MED ORDER — ONDANSETRON HCL 4 MG/2ML IJ SOLN
INTRAMUSCULAR | Status: AC
  Filled 2020-09-04: qty 2

## 2020-09-04 MED ORDER — CHLORHEXIDINE GLUCONATE 0.12 % MT SOLN
OROMUCOSAL | Status: AC
  Administered 2020-09-04: 15 mL via OROMUCOSAL
  Filled 2020-09-04: qty 15

## 2020-09-04 MED ORDER — BUPIVACAINE HCL 0.25 % IJ SOLN
INTRAMUSCULAR | Status: DC | PRN
  Administered 2020-09-04: 10 mL

## 2020-09-04 MED ORDER — PHENYLEPHRINE HCL-NACL 10-0.9 MG/250ML-% IV SOLN
INTRAVENOUS | Status: DC | PRN
  Administered 2020-09-04: 40 ug/min via INTRAVENOUS

## 2020-09-04 MED ORDER — ROCURONIUM BROMIDE 10 MG/ML (PF) SYRINGE
PREFILLED_SYRINGE | INTRAVENOUS | Status: AC
  Filled 2020-09-04: qty 10

## 2020-09-04 MED ORDER — ORAL CARE MOUTH RINSE: 15.0000 mL | Freq: Once | OROMUCOSAL | Status: AC

## 2020-09-04 MED ORDER — ROCURONIUM BROMIDE 10 MG/ML (PF) SYRINGE
PREFILLED_SYRINGE | INTRAVENOUS | Status: DC | PRN
  Administered 2020-09-04: 20 mg via INTRAVENOUS
  Administered 2020-09-04: 50 mg via INTRAVENOUS

## 2020-09-04 MED ORDER — CEFAZOLIN SODIUM-DEXTROSE 2-4 GM/100ML-% IV SOLN
INTRAVENOUS | Status: AC
  Filled 2020-09-04: qty 100

## 2020-09-04 MED ORDER — OXYCODONE HCL 5 MG PO TABS: ORAL_TABLET | ORAL | 0 refills | Status: DC

## 2020-09-04 SURGICAL SUPPLY — 40 items
BLADE LONG MED 31X9 (MISCELLANEOUS) ×1 IMPLANT
BNDG CMPR 9X4 STRL LF SNTH (GAUZE/BANDAGES/DRESSINGS) ×1
BNDG COHESIVE 1X5 TAN STRL LF (GAUZE/BANDAGES/DRESSINGS) ×2 IMPLANT
BNDG ESMARK 4X9 LF (GAUZE/BANDAGES/DRESSINGS) ×2 IMPLANT
BNDG GAUZE ELAST 4 BULKY (GAUZE/BANDAGES/DRESSINGS) IMPLANT
CORD BIPOLAR FORCEPS 12FT (ELECTRODE) ×2 IMPLANT
COVER WAND RF STERILE (DRAPES) ×2 IMPLANT
CUFF TOURN SGL QUICK 18X4 (TOURNIQUET CUFF) ×2 IMPLANT
CUFF TOURN SGL QUICK 24 (TOURNIQUET CUFF)
CUFF TRNQT CYL 24X4X16.5-23 (TOURNIQUET CUFF) IMPLANT
DURAPREP 26ML APPLICATOR (WOUND CARE) ×2 IMPLANT
FACESHIELD WRAPAROUND (MASK) ×2 IMPLANT
FACESHIELD WRAPAROUND OR TEAM (MASK) IMPLANT
GAUZE SPONGE 4X4 12PLY STRL (GAUZE/BANDAGES/DRESSINGS) ×1 IMPLANT
GAUZE XEROFORM 1X8 LF (GAUZE/BANDAGES/DRESSINGS) ×2 IMPLANT
GLOVE BIO SURGEON STRL SZ7.5 (GLOVE) ×2 IMPLANT
GLOVE SRG 8 PF TXTR STRL LF DI (GLOVE) ×1 IMPLANT
GLOVE SURG UNDER POLY LF SZ8 (GLOVE) ×2
GOWN STRL REUS W/ TWL LRG LVL3 (GOWN DISPOSABLE) ×1 IMPLANT
GOWN STRL REUS W/ TWL XL LVL3 (GOWN DISPOSABLE) ×1 IMPLANT
GOWN STRL REUS W/TWL LRG LVL3 (GOWN DISPOSABLE) ×2
GOWN STRL REUS W/TWL XL LVL3 (GOWN DISPOSABLE) ×2
KIT BASIN OR (CUSTOM PROCEDURE TRAY) ×2 IMPLANT
KIT TURNOVER KIT B (KITS) ×2 IMPLANT
NDL HYPO 25GX1X1/2 BEV (NEEDLE) IMPLANT
NEEDLE HYPO 25GX1X1/2 BEV (NEEDLE) IMPLANT
NS IRRIG 1000ML POUR BTL (IV SOLUTION) ×2 IMPLANT
PACK ORTHO EXTREMITY (CUSTOM PROCEDURE TRAY) ×2 IMPLANT
PAD ARMBOARD 7.5X6 YLW CONV (MISCELLANEOUS) ×4 IMPLANT
PAD CAST 4YDX4 CTTN HI CHSV (CAST SUPPLIES) IMPLANT
PADDING CAST COTTON 4X4 STRL (CAST SUPPLIES)
SPECIMEN JAR SMALL (MISCELLANEOUS) ×2 IMPLANT
SUT CHROMIC 6 0 PS 4 (SUTURE) IMPLANT
SUT MON AB 5-0 PS2 18 (SUTURE) ×2 IMPLANT
SUT VIC AB 4-0 PS2 18 (SUTURE) ×1 IMPLANT
SUT VIC AB 5-0 PS2 18 (SUTURE) ×1 IMPLANT
SUT VICRYL 4-0 PS2 18IN ABS (SUTURE) IMPLANT
SYR CONTROL 10ML LL (SYRINGE) IMPLANT
TOWEL GREEN STERILE (TOWEL DISPOSABLE) ×2 IMPLANT
UNDERPAD 30X36 HEAVY ABSORB (UNDERPADS AND DIAPERS) ×2 IMPLANT

## 2020-09-04 NOTE — H&P (Signed)
[name] is an 60 y.o. male.   Chief Complaint: finger necrosis HPI: 60 yo male with ESRD with painful ischemic fingertips.  Beginnings of necrosis at tips of right index and ring fingers.  They are painful.  He wishes to have amputation of the right index and ring fingers for management of the necrosis and pain.  Allergies:  Allergies  Allergen Reactions  . Tramadol Itching and Other (See Comments)    Other reaction(s): Unknown  . Grass Extracts [Gramineae Pollens]     Other reaction(s): Sneezing  . Morphine And Related Other (See Comments)    Stomach pain  . Pollen Extract Other (See Comments)    Other reaction(s): Sneezing (finding)  . Acetaminophen Nausea Only    Stomach ache   . Aspirin Itching and Other (See Comments)    STOMACH PAIN  Other reaction(s): Unknown  . Clonidine Derivatives Itching    Past Medical History:  Diagnosis Date  . Anemia   . Anxiety   . Arthritis    left shoulder  . Atherosclerosis of aorta ([place])   . Cardiomegaly   . Chest pain    DATE UNKNOWN, C/O PERIODICALLY  . Cocaine abuse ([place])   . COPD exacerbation ([place]) 08/17/2016  . Coronary artery disease    stent 02/22/17  . Dysrhythmia   . ESRD (end stage renal disease) on dialysis ([place])    "[name]; MWF" (07/04/2017)  . GERD (gastroesophageal reflux disease)    DATE UNKNOWN  . Hemorrhoids   . Hepatitis B, chronic ([place])   . Hepatitis C   . History of kidney stones   . Hyperkalemia   . Hypertension   . Kidney failure   . Metabolic bone disease    Patient denies  . Mitral stenosis   . Myocardial infarction ([place])   . Pneumonia   . Pulmonary edema   . Solitary rectal ulcer syndrome 07/2017   at flex sig for rectal bleeding  . Tubular adenoma of colon     Past Surgical History:  Procedure Laterality Date  . A/V FISTULAGRAM Left 05/26/2017   Procedure: A/V FISTULAGRAM;  Surgeon: Conrad Sheboygan, MD;  Location: [place] [facility];  Service: Cardiovascular;  Laterality:  Left;  . A/V FISTULAGRAM Right 11/18/2017   Procedure: A/V FISTULAGRAM - Right Arm;  Surgeon: Elam Dutch, MD;  Location: [place] [facility];  Service: Cardiovascular;  Laterality: Right;  . APPLICATION OF WOUND VAC Left 06/14/2017   Procedure: APPLICATION OF WOUND VAC;  Surgeon: Katha Cabal, MD;  Location: [facility];  Service: Vascular;  Laterality: Left;  . AV FISTULA PLACEMENT  2012   BELIEVED WAS PLACED IN JUNE  . AV FISTULA PLACEMENT Right 08/09/2017   Procedure: Creation Right arm ARTERIOVENOUS BRACHIOCEPOHALIC FISTULA;  Surgeon: Elam Dutch, MD;  Location: [facility] OR;  Service: Vascular;  Laterality: Right;  . AV FISTULA PLACEMENT Right 11/22/2017   Procedure: INSERTION OF ARTERIOVENOUS (AV) GORE-TEX GRAFT RIGHT UPPER ARM;  Surgeon: Elam Dutch, MD;  Location: [place];  Service: Vascular;  Laterality: Right;  . BIOPSY  01/25/2018   Procedure: BIOPSY;  Surgeon: Jerene Bears, MD;  Location: [place];  Service: Gastroenterology;;  . BIOPSY  04/10/2019   Procedure: BIOPSY;  Surgeon: Jerene Bears, MD;  Location: [facility];  Service: Gastroenterology;;  . COLONOSCOPY    . COLONOSCOPY WITH PROPOFOL N/A 01/25/2018   Procedure: COLONOSCOPY WITH PROPOFOL;  Surgeon: Jerene Bears, MD;  Location: [place] [facility];  Service: Gastroenterology;  Laterality: N/A;  . CORONARY STENT INTERVENTION N/A 02/22/2017   Procedure: CORONARY STENT INTERVENTION;  Surgeon: Nigel Mormon, MD;  Location: [place] [facility];  Service: Cardiovascular;  Laterality: N/A;  . ESOPHAGOGASTRODUODENOSCOPY (EGD) WITH PROPOFOL N/A 01/25/2018   Procedure: ESOPHAGOGASTRODUODENOSCOPY (EGD) WITH PROPOFOL;  Surgeon: Jerene Bears, MD;  Location: [place];  Service: Gastroenterology;  Laterality: N/A;  . ESOPHAGOGASTRODUODENOSCOPY (EGD) WITH PROPOFOL N/A 04/10/2019   Procedure: ESOPHAGOGASTRODUODENOSCOPY (EGD) WITH PROPOFOL;  Surgeon: Jerene Bears, MD;  Location: [facility];  Service: Gastroenterology;   Laterality: N/A;  . FLEXIBLE SIGMOIDOSCOPY N/A 07/15/2017   Procedure: FLEXIBLE SIGMOIDOSCOPY;  Surgeon: Carol Ada, MD;  Location: [place];  Service: Endoscopy;  Laterality: N/A;  . HEMORRHOID BANDING    . I & D EXTREMITY Left 06/01/2017   Procedure: IRRIGATION AND DEBRIDEMENT LEFT ARM HEMATOMA WITH LIGATION OF LEFT ARM AV FISTULA;  Surgeon: Elam Dutch, MD;  Location: [place];  Service: Vascular;  Laterality: Left;  . I & D EXTREMITY Left 06/14/2017   Procedure: IRRIGATION AND DEBRIDEMENT EXTREMITY;  Surgeon: Katha Cabal, MD;  Location: [facility];  Service: Vascular;  Laterality: Left;  . INSERTION OF DIALYSIS CATHETER  05/30/2017  . INSERTION OF DIALYSIS CATHETER N/A 05/30/2017   Procedure: INSERTION OF DIALYSIS CATHETER;  Surgeon: Elam Dutch, MD;  Location: [place];  Service: Vascular;  Laterality: N/A;  . IR PARACENTESIS  08/30/2017  . IR PARACENTESIS  09/29/2017  . IR PARACENTESIS  10/28/2017  . IR PARACENTESIS  11/09/2017  . IR PARACENTESIS  11/16/2017  . IR PARACENTESIS  11/28/2017  . IR PARACENTESIS  12/01/2017  . IR PARACENTESIS  12/06/2017  . IR PARACENTESIS  01/03/2018  . IR PARACENTESIS  01/23/2018  . IR PARACENTESIS  02/07/2018  . IR PARACENTESIS  02/21/2018  . IR PARACENTESIS  03/06/2018  . IR PARACENTESIS  03/17/2018  . IR PARACENTESIS  04/04/2018  . IR PARACENTESIS  12/28/2018  . IR PARACENTESIS  01/08/2019  . IR PARACENTESIS  01/23/2019  . IR PARACENTESIS  02/01/2019  . IR PARACENTESIS  02/19/2019  . IR PARACENTESIS  03/01/2019  . IR PARACENTESIS  03/15/2019  . IR PARACENTESIS  04/03/2019  . IR PARACENTESIS  04/12/2019  . IR PARACENTESIS  05/01/2019  . IR PARACENTESIS  05/08/2019  . IR PARACENTESIS  05/24/2019  . IR PARACENTESIS  06/12/2019  . IR PARACENTESIS  07/09/2019  . IR PARACENTESIS  07/27/2019  . IR PARACENTESIS  08/09/2019  . IR PARACENTESIS  08/21/2019  . IR PARACENTESIS  09/17/2019  . IR PARACENTESIS  10/05/2019  . IR PARACENTESIS  10/29/2019  . IR  PARACENTESIS  11/08/2019  . IR PARACENTESIS  12/12/2019  . IR PARACENTESIS  01/03/2020  . IR PARACENTESIS  01/10/2020  . IR PARACENTESIS  01/17/2020  . IR PARACENTESIS  01/24/2020  . IR PARACENTESIS  01/31/2020  . IR PARACENTESIS  02/07/2020  . IR PARACENTESIS  02/21/2020  . IR PARACENTESIS  05/21/2020  . IR RADIOLOGIST EVAL & MGMT  02/14/2018  . IR RADIOLOGIST EVAL & MGMT  02/22/2019  . LEFT HEART CATH AND CORONARY ANGIOGRAPHY N/A 02/22/2017   Procedure: LEFT HEART CATH AND CORONARY ANGIOGRAPHY;  Surgeon: Nigel Mormon, MD;  Location: [place] [facility];  Service: Cardiovascular;  Laterality: N/A;  . LIGATION OF ARTERIOVENOUS  FISTULA Left 10/12/5954   Procedure: Plication of Left Arm Arteriovenous Fistula;  Surgeon: Elam Dutch, MD;  Location: [place];  Service: Vascular;  Laterality:  Left;  . POLYPECTOMY    . POLYPECTOMY  01/25/2018   Procedure: POLYPECTOMY;  Surgeon: Jerene Bears, MD;  Location: [place];  Service: Gastroenterology;;  . REVISON OF ARTERIOVENOUS FISTULA Left 1/61/0960   Procedure: PLICATION OF DISTAL ANEURYSMAL SEGEMENT OF LEFT UPPER ARM ARTERIOVENOUS FISTULA;  Surgeon: Elam Dutch, MD;  Location: [place];  Service: Vascular;  Laterality: Left;  . REVISON OF ARTERIOVENOUS FISTULA Left 4/54/0981   Procedure: Plication of Left Upper Arm Fistula ;  Surgeon: Waynetta Sandy, MD;  Location: [place];  Service: Vascular;  Laterality: Left;  . SKIN GRAFT SPLIT THICKNESS LEG / FOOT Left    SKIN GRAFT SPLIT THICKNESS LEFT ARM DONOR SITE: LEFT ANTERIOR THIGH  . SKIN SPLIT GRAFT Left 07/04/2017   Procedure: SKIN GRAFT SPLIT THICKNESS LEFT ARM DONOR SITE: LEFT ANTERIOR THIGH;  Surgeon: Elam Dutch, MD;  Location: [place];  Service: Vascular;  Laterality: Left;  . THROMBECTOMY W/ EMBOLECTOMY Left 06/05/2017   Procedure: EXPLORATION OF LEFT ARM FOR BLEEDING; OVERSEWED PROXIMAL FISTULA;  Surgeon: Angelia Mould, MD;  Location: [place];  Service: Vascular;  Laterality:  Left;  . WOUND EXPLORATION Left 06/03/2017   Procedure: WOUND EXPLORATION WITH WOUND VAC APPLICATION TO LEFT ARM;  Surgeon: Angelia Mould, MD;  Location: [facility] OR;  Service: Vascular;  Laterality: Left;    Family History: Family History  Problem Relation Age of Onset  . Heart disease Mother   . Lung cancer Mother   . Heart disease Father   . Malignant hyperthermia Father   . COPD Father   . Throat cancer Sister   . Esophageal cancer Sister   . Hypertension Other   . COPD Other   . Throat cancer Sister   . Colon cancer Neg Hx   . Colon polyps Neg Hx   . Rectal cancer Neg Hx   . Stomach cancer Neg Hx     Social History:   reports that he has been smoking cigarettes. He started smoking about 47 years ago. He has a 43.00 pack-year smoking history. He has never used smokeless tobacco. He reports previous alcohol use. He reports current drug use. Drugs: Marijuana and Cocaine.  Medications: Medications Prior to Admission  Medication Sig Dispense Refill  . albuterol (VENTOLIN HFA) 108 (90 Base) MCG/ACT inhaler Inhale 2 puffs into the lungs every 4 (four) hours as needed for wheezing or shortness of breath. 8.5 g 2  . cetirizine (ZYRTEC) 10 MG tablet Take 10 mg by mouth daily as needed.    . diltiazem (CARDIZEM CD) 120 MG 24 hr capsule TAKE 1 CAPSULE BY MOUTH EVERY DAY (Patient taking differently: Take 120 mg by mouth daily.) 90 capsule 0  . gabapentin (NEURONTIN) 300 MG capsule Take 1 capsule (300 mg total) by mouth in the morning and at bedtime. 60 capsule 3  . metoprolol succinate (TOPROL XL) 25 MG 24 hr tablet Take 1 tablet (25 mg total) by mouth daily. 30 tablet 3  . ondansetron (ZOFRAN) 4 MG tablet TAKE 1 TABLET BY MOUTH EVERY 8 HOURS AS NEEDED FOR NAUSEA AND VOMITING (Patient taking differently: Take 4 mg by mouth every 8 (eight) hours as needed for nausea or vomiting.) 30 tablet 3  . Oxycodone HCl 10 MG TABS Take 10 mg by mouth 3 (three) times daily as needed (pain).     .  pantoprazole (PROTONIX) 40 MG tablet Take 1 tablet (40 mg total) by mouth 2 (two) times daily. 60 tablet 0  .  sevelamer carbonate (RENVELA) 800 MG tablet Take 2,400 mg by mouth See admin instructions. Take 3 tablets (2400 mg) by mouth up to three times daily with meals    . amiodarone (PACERONE) 200 MG tablet Take 1 tablet (200 mg total) by mouth daily. 30 tablet 3  . ciclopirox (PENLAC) 8 % solution Apply topically at bedtime. For 8 weeks (Patient not taking: No sig reported) 6.6 mL 2  . diphenhydrAMINE (BENADRYL) 25 mg capsule Take 1 capsule (25 mg total) by mouth every 6 (six) hours as needed for itching or allergies. (Patient not taking: Reported on 09/03/2020) 30 capsule 0  . tenofovir (VIREAD) 300 MG tablet Take 1 by mouth every Monday. (Patient not taking: No sig reported) 12 tablet 0    Results for orders placed or performed during the hospital encounter of 09/04/20 (from the past 48 hour(s))  I-STAT, chem 8     Status: Abnormal   Collection Time: 09/04/20 12:45 PM  Result Value Ref Range   Sodium 135 135 - 145 mmol/L   Potassium 5.3 (H) 3.5 - 5.1 mmol/L   Chloride 97 (L) 98 - 111 mmol/L   BUN 55 (H) 6 - 20 mg/dL   Creatinine, Ser 9.40 (H) 0.61 - 1.24 mg/dL   Glucose, Bld 69 (L) 70 - 99 mg/dL    Comment: Glucose reference range applies only to samples taken after fasting for at least 8 hours.   Calcium, Ion 1.09 (L) 1.15 - 1.40 mmol/L   TCO2 20 (L) 22 - 32 mmol/L   Hemoglobin 12.6 (L) 13.0 - 17.0 g/dL   HCT 37.0 (L) 39.0 - 52.0 %   *Note: Due to a large number of results and/or encounters for the requested time period, some results have not been displayed. A complete set of results can be found in Results Review.    No results found.   A comprehensive review of systems was negative.  Blood pressure (!) 134/59, pulse 79, temperature 97.9 F (36.6 C), temperature source Oral, resp. rate 18, height 5\' 9"  (1.753 m), weight 68 kg, SpO2 97 %.  General appearance: alert,  cooperative and appears stated age Head: Normocephalic, without obvious abnormality, atraumatic Neck: supple, symmetrical, trachea midline Cardio: regular rate and rhythm Resp: clear to auscultation bilaterally Extremities: Intact sensation and capillary refill all digits except right index and ring which have ischemia.  Sloughing of skin at tip of index finger and non healing wound in ring finger.  +epl/fpl/io.  No wounds.  Pulses: 2+ and symmetric Skin: Skin color, texture, turgor normal. No rashes or lesions Neurologic: Grossly normal Incision/Wound: as above  Assessment/Plan Right index and ring finger ischemia and early necrosis.  Plan amputation right index and ring finger for management of ischemia/necrosis and pain.  Leanora Cover 09/04/2020, 1:42 PM

## 2020-09-04 NOTE — Op Note (Signed)
NAME: [name] [place] MEDICAL RECORD NO: [id number] DATE OF BIRTH: 1960/11/30 FACILITY: [name] LOCATION: [facility] OR PHYSICIAN: Tennis Must, MD   OPERATIVE REPORT   DATE OF PROCEDURE: 09/04/20    PREOPERATIVE DIAGNOSIS:   Right index and ring finger ischemia and necrosis   POSTOPERATIVE DIAGNOSIS:   Right index and ring finger ischemia and necrosis   PROCEDURE:   1.  Right index finger amputation through middle phalanx 2.  Right ring finger amputation through middle phalanx   SURGEON:  Leanora Cover, M.D.   ASSISTANT: none   ANESTHESIA:  General   INTRAVENOUS FLUIDS:  Per anesthesia flow sheet.   ESTIMATED BLOOD LOSS:  Minimal.   COMPLICATIONS:  None.   SPECIMENS:   Right index and ring finger to pathology for gross exam   TOURNIQUET TIME:    Total Tourniquet Time Documented: Forearm (Right) - 38 minutes Total: Forearm (Right) - 38 minutes    DISPOSITION:  Stable to PACU.   INDICATIONS: 60 year old male with end-stage renal disease on dialysis.  He has ischemia and necrosis in the right index and ring fingers.  This is painful.  He wishes to have amputation for management of the necrosis and pain. Risks, benefits and alternatives of surgery were discussed including the risks of blood loss, infection, damage to nerves, vessels, tendons, ligaments, bone for surgery, need for additional surgery, complications with wound healing, continued pain, stiffness, need for further amputation.  He voiced understanding of these risks and elected to proceed.  OPERATIVE COURSE:  After being identified preoperatively by myself,  the patient and I agreed on the procedure and site of the procedure.  The surgical site was marked.  Surgical consent had been signed. He was given IV antibiotics as preoperative antibiotic prophylaxis. He was transferred to the operating room and placed on the operating table in supine position with the Right upper extremity on an arm board.  General anesthesia  was induced by the anesthesiologist.  Right upper extremity was prepped and draped in normal sterile orthopedic fashion.  A surgical pause was performed between the surgeons, anesthesia, and operating room staff and all were in agreement as to the patient, procedure, and site of procedure.  Tourniquet at the proximal aspect of the forearm was inflated to 250 mmHg after exsanguination of the arm with an Esmarch bandage.    Fishmouth type incision was made at the index finger over the middle phalanx.  This was carried in subcutaneous tissues by spreading technique.  Bipolar electrocautery was used to obtain hemostasis.  The radial and ulnar digital nerves were identified placed under traction bipolar and allowed to retract.  The radial and ulnar digital arteries were identified.  These were tied with 4-0 Vicryl suture and bipolared and allowed to retract.  The flexor and extensor tendons were transected.  The finger was amputated through the middle phalanx using the bone cutters.  The bone ends were smoothed off using rongeurs.  Bipolar trocar was used to obtain hemostasis.  The wound was copiously irrigated with sterile saline.  Was closed with 5-0 Monocryl in a vertical mattress fashion.  Good tension-free reapproximation of skin edges was obtained.  In the ring finger a fishmouth type incision was made over the middle phalanx.  This was carried into subcutaneous tissues by spreading technique.  Bipolar electrocautery was used to obtain hemostasis.  The radial and ulnar digital nerves were identified placed under traction bipolar and allowed to retract.  The radial and ulnar digital arteries were  identified.  They were tied off with 4-0 Vicryl suture bipolared and allowed to retract.  The flexor and extensor tendons were transected.  The ring finger was amputated through the middle phalanx using the bone cutters.  The bone ends were smoothed using the rongeurs.  Bipolar electrocautery is used to obtain hemostasis.   The wound was copiously irrigated with sterile saline.  It was then closed with 5-0 Monocryl suture in a vertical mattress fashion.  Good tension-free reapproximation of the skin edges was obtained.  Digital blocks were performed to the index and long fingers to aid in postoperative analgesia.  Wounds were dressed with sterile Xeroform 4 x 4's and wrapped with Coban dressing lightly.  The tourniquet was deflated at 38 minutes.  Fingertips were pink with brisk capillary refill after deflation of tourniquet.  The operative  drapes were broken down.  The patient was awoken from anesthesia safely.  He was transferred back to the stretcher and taken to PACU in stable condition.  I will see him back in the office in 1 week for postoperative followup.  I will give him a prescription for Oxycodone 5 mg 1 p.o. every 6 hours as needed pain dispense #20.  He is on a pain regimen managed by his pain management doctor.  He will continue with this as well.   Leanora Cover, MD Electronically signed, 09/04/20

## 2020-09-04 NOTE — Transfer of Care (Signed)
Immediate Anesthesia Transfer of Care Note  Patient: [name]  Procedure(s) Performed: RIGHT INDEX AND RIGHT RING FINGER AMPUTATION DIGIT (Right Hand)  Patient Location: PACU  Anesthesia Type:General  Level of Consciousness: awake, alert  and patient cooperative  Airway & Oxygen Therapy: Patient Spontanous Breathing and Patient connected to face mask  Post-op Assessment: Report given to RN, Post -op Vital signs reviewed and stable and Patient moving all extremities  Post vital signs: Reviewed and stable  Last Vitals:  Vitals Value Taken Time  BP 109/97 09/04/20 1642  Temp    Pulse 124 09/04/20 1643  Resp 23 09/04/20 1643  SpO2 93 % 09/04/20 1643  Vitals shown include unvalidated device data.  Last Pain:  Vitals:   09/04/20 1257  TempSrc:   PainSc: 10-Worst pain ever      Patients Stated Pain Goal: 4 (70/76/15 1834)  Complications: No complications documented.

## 2020-09-04 NOTE — Discharge Instructions (Addendum)
[facility] Instructions °Hand Surgery ° °Wound Care: °Keep your hand elevated above the level of your heart.  Do not allow it to dangle by your side.  Keep the dressing dry and do not remove it unless your doctor advises you to do so.  He will usually change it at the time of your post-op visit.  Moving your fingers is advised to stimulate circulation but will depend on the site of your surgery.  If you have a splint applied, your doctor will advise you regarding movement. ° °Activity: °Do not drive or operate machinery today.  Rest today and then you may return to your normal activity and work as indicated by your physician. ° °Diet:  °Drink liquids today or eat a light diet.  You may resume a regular diet tomorrow.   ° °General expectations: °Pain for two to three days. °Fingers may become slightly swollen. ° °Call your doctor if any of the following occur: °Severe pain not relieved by pain medication. °Elevated temperature. °Dressing soaked with blood. °Inability to move fingers. °White or bluish color to fingers. ° °

## 2020-09-04 NOTE — Anesthesia Procedure Notes (Signed)
Procedure Name: Intubation Date/Time: 09/04/2020 3:26 PM Performed by: Barrington Ellison, CRNA Pre-anesthesia Checklist: Patient identified, Emergency Drugs available, Suction available and Patient being monitored Patient Re-evaluated:Patient Re-evaluated prior to induction Oxygen Delivery Method: Circle System Utilized Preoxygenation: Pre-oxygenation with 100% oxygen Induction Type: IV induction Ventilation: Mask ventilation without difficulty Laryngoscope Size: 2 and Miller Grade View: Grade I Tube type: Oral Tube size: 7.5 mm Number of attempts: 1 Airway Equipment and Method: Stylet and Oral airway Placement Confirmation: ETT inserted through vocal cords under direct vision,  positive ETCO2 and breath sounds checked- equal and bilateral Secured at: 23 cm Tube secured with: Tape Dental Injury: Teeth and Oropharynx as per pre-operative assessment and Injury to lip  Comments: Small lip lac, ointment applied, intubation by Domingo Madeira, SRNA under direct supervision

## 2020-09-04 NOTE — Anesthesia Postprocedure Evaluation (Signed)
Anesthesia Post Note  Patient: [name]  Procedure(s) Performed: RIGHT INDEX AND RIGHT RING FINGER AMPUTATION DIGIT (Right Hand)     Patient location during evaluation: PACU Anesthesia Type: General Level of consciousness: awake and alert Pain management: pain level controlled Vital Signs Assessment: post-procedure vital signs reviewed and stable Respiratory status: spontaneous breathing, nonlabored ventilation and respiratory function stable Cardiovascular status: blood pressure returned to baseline and stable Postop Assessment: no apparent nausea or vomiting Anesthetic complications: no   No complications documented.  Last Vitals:  Vitals:   09/04/20 1700 09/04/20 1707  BP:  (!) 108/48  Pulse: (!) 101 95  Resp: 17 12  Temp:  36.5 C  SpO2: 93% 94%    Last Pain:  Vitals:   09/04/20 1707  TempSrc:   PainSc: 0-No pain                 Hagan Vanauken,W. EDMOND

## 2020-09-05 ENCOUNTER — Encounter (HOSPITAL_COMMUNITY): Payer: Self-pay | Admitting: Orthopedic Surgery

## 2020-09-06 IMAGING — CT CT ABDOMEN AND PELVIS WITH CONTRAST
2 of 5 series · 16 of 46 positions shown, 18 images · IV contrast (Omni 300)
Comparison: CT 12/17/2018

CLINICAL DATA: Abdominal pain, nausea, vomiting

EXAM:
CT ABDOMEN AND PELVIS WITH CONTRAST
TECHNIQUE: Multidetector CT imaging of the abdomen and pelvis was performed
using the standard protocol following bolus administration of
intravenous contrast.
CONTRAST:  100mL OMNIPAQUE IOHEXOL 300 MG/ML  SOLN

[Series 3: a/p w/ 5mm · axial · 0.80mm/px · z∈[+668,+1158]mm · 13 of 110 slices shown, 15 images]
[im 6/110  soft-tissue]
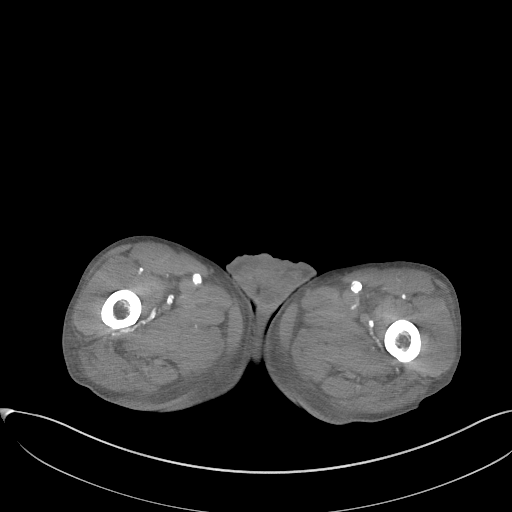
[im 6/110  bone]
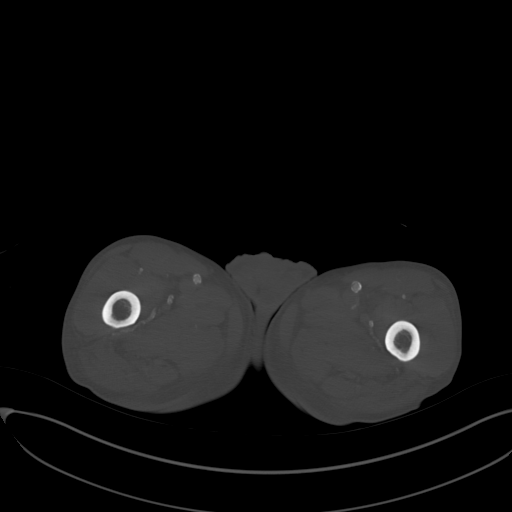
[im 17/110  soft-tissue]
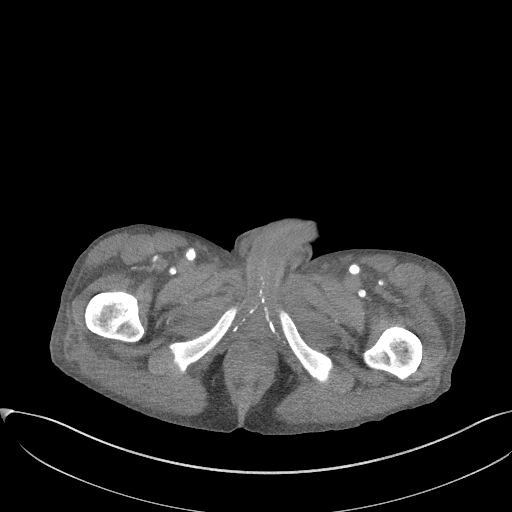
[im 22/110  soft-tissue]
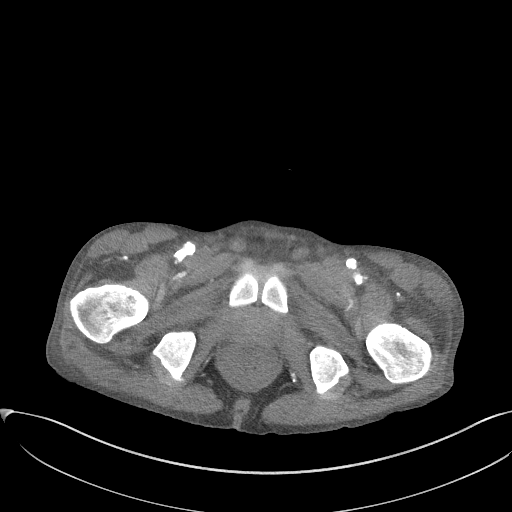
[im 33/110  soft-tissue]
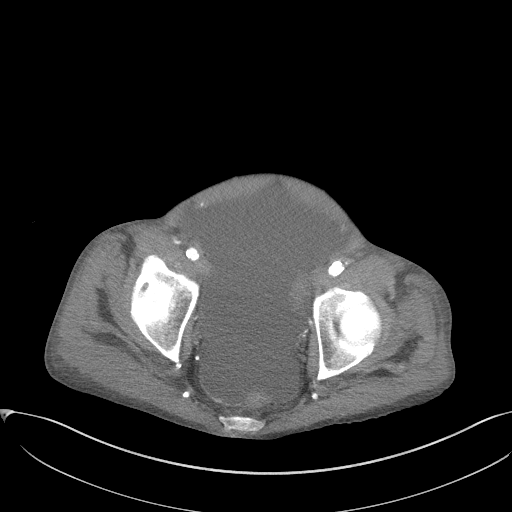
[im 39/110  soft-tissue]
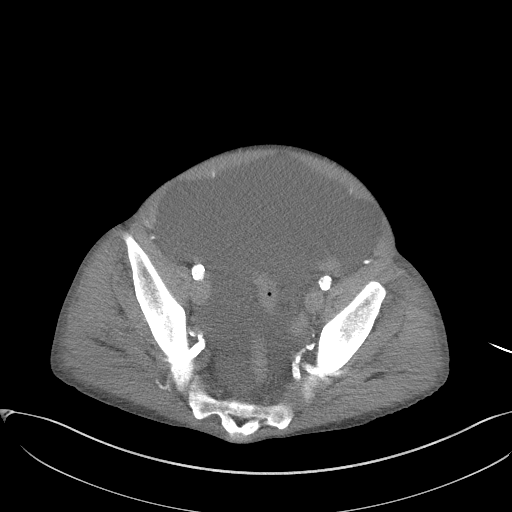
[im 50/110  soft-tissue]
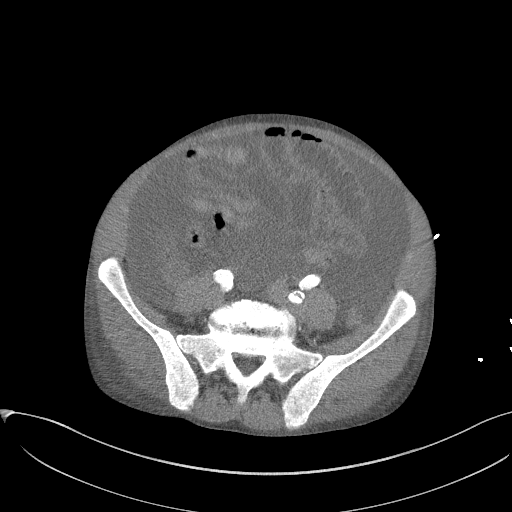
[im 55/110  soft-tissue]
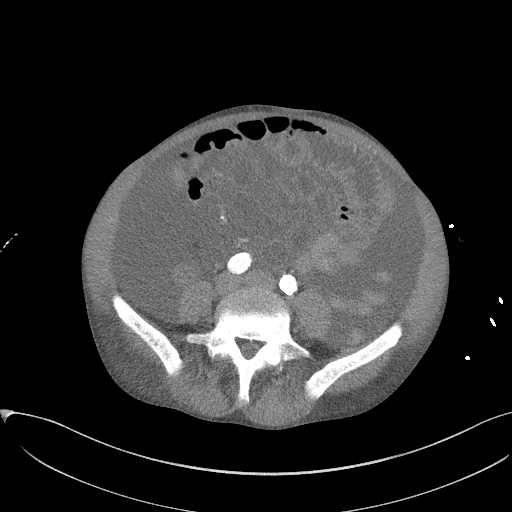
[im 60/110  soft-tissue]
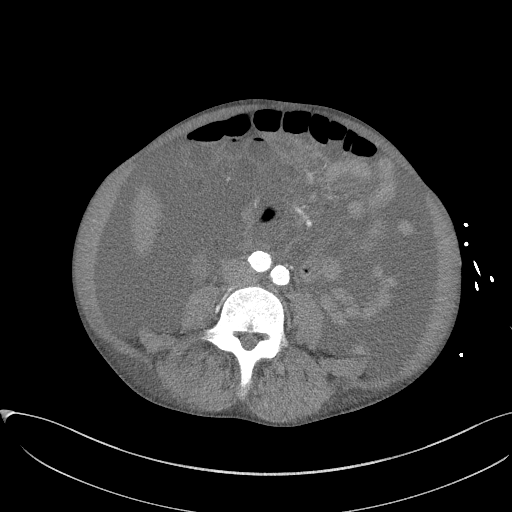
[im 71/110  soft-tissue]
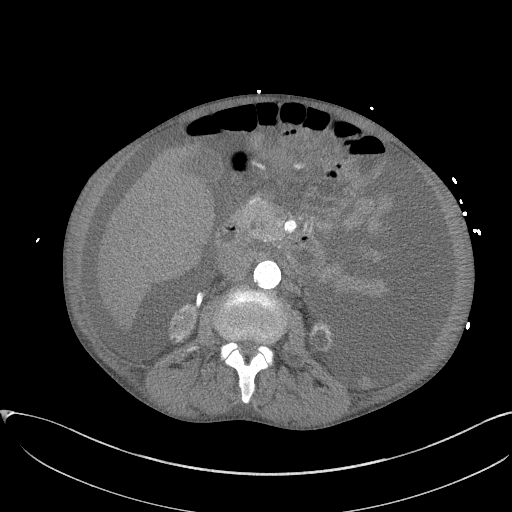
[im 71/110  bone]
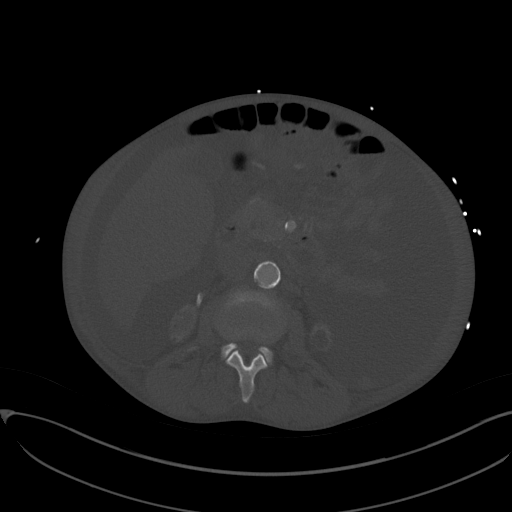
[im 77/110  soft-tissue]
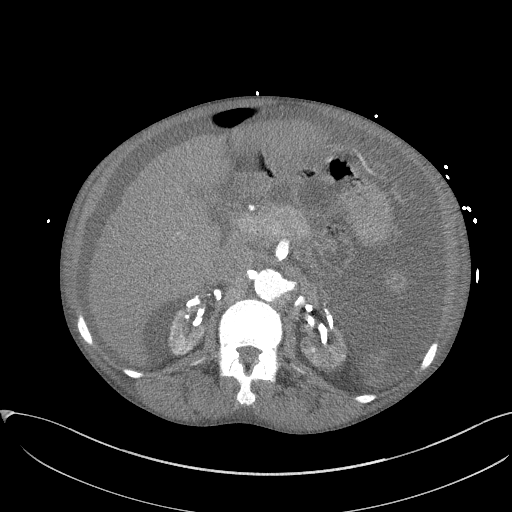
[im 88/110  soft-tissue]
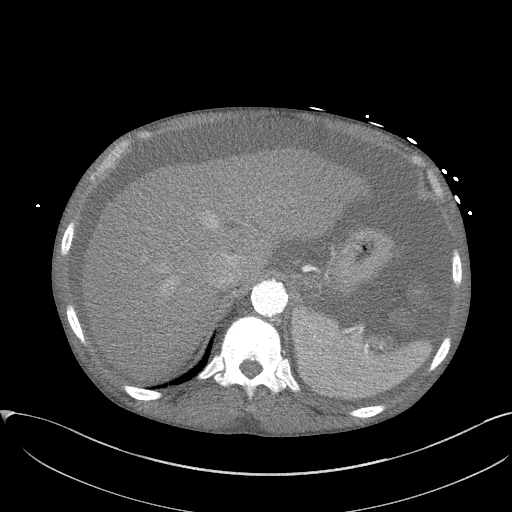
[im 93/110  soft-tissue]
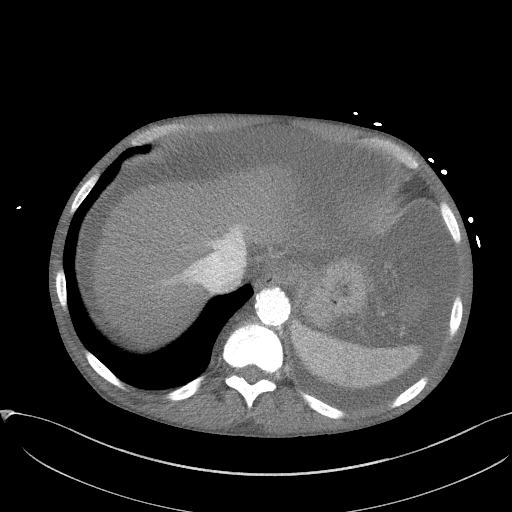
[im 104/110  soft-tissue]
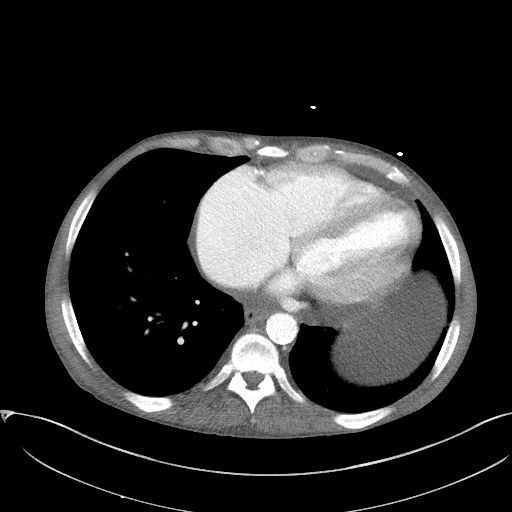

[Series 6: a/p w/ cor · coronal · 0.83mm/px · 3 of 159 slices shown]
[im 53/159  soft-tissue]
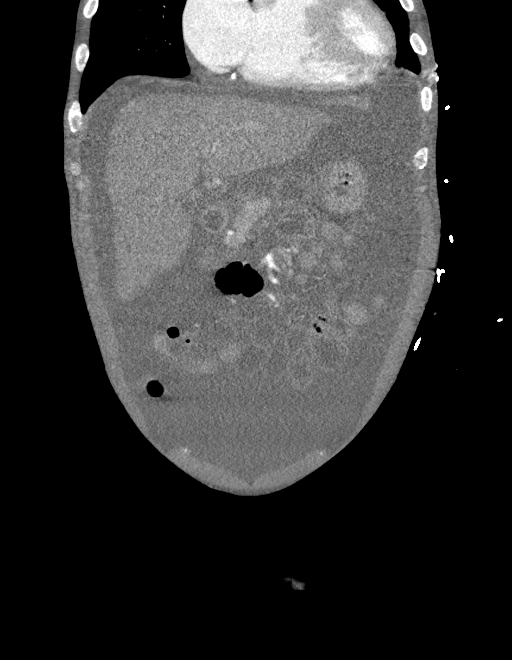
[im 71/159  soft-tissue]
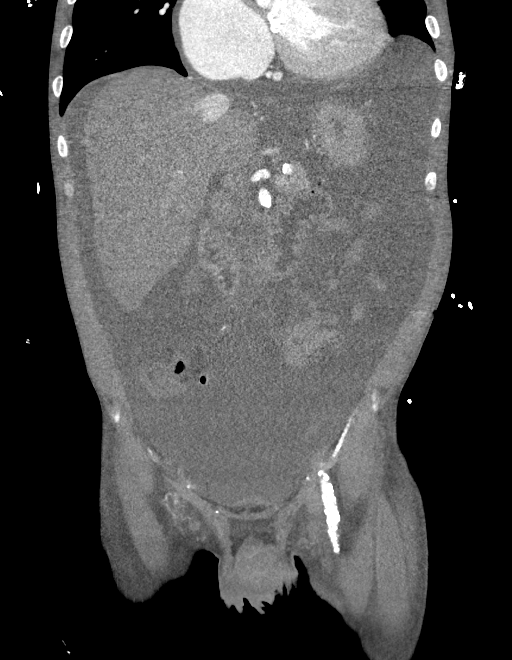
[im 88/159  soft-tissue]
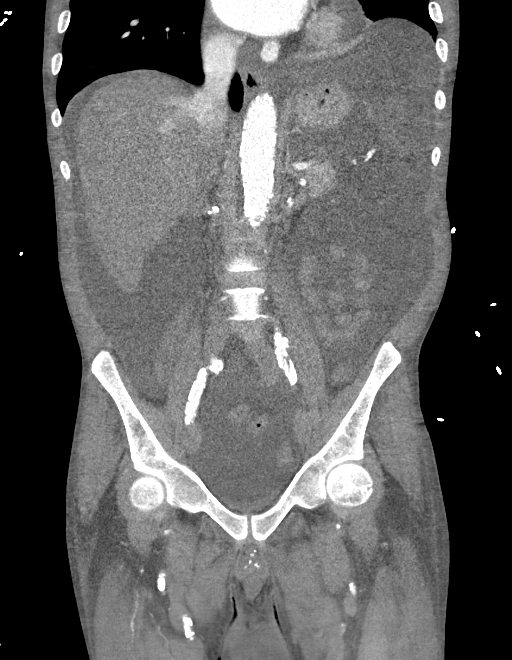

[16 of 46 positions shown; findings below may reference images not displayed]

FINDINGS: Lower chest: Cardiomegaly. Small pericardial effusion.
Calcifications throughout the visualized right coronary artery. Lung
bases clear.

Hepatobiliary: Changes of cirrhosis. No focal hepatic abnormality.
Gallbladder grossly unremarkable.

Pancreas: No focal abnormality or ductal dilatation.

Spleen: No focal abnormality.  Normal size.

Adrenals/Urinary Tract: Severely atrophic kidneys, likely related to
end-stage renal disease. Extensive renovascular calcifications. No
hydronephrosis. Adrenal glands and urinary bladder unremarkable.

Stomach/Bowel: Stomach, large and small bowel grossly unremarkable.
Clip again noted within the rectum.

Vascular/Lymphatic: No evidence of aneurysm or adenopathy. Diffuse
aortic and branch vessel atherosclerosis.

Reproductive: No visible focal abnormality.

Other: Marked large volume ascites throughout the abdomen and
pelvis, stable.

Musculoskeletal: No acute bony abnormality. Degenerative disc
disease at L5-S1.
IMPRESSION: Cirrhosis with large volume ascites again noted, stable since prior
study.

Severely atrophic kidneys bilaterally with extensive renovascular
calcifications.

Aortic and branch vessel atherosclerosis.

Cardiomegaly, small pericardial effusion.

## 2020-09-07 ENCOUNTER — Emergency Department (HOSPITAL_COMMUNITY): Payer: Medicare Other

## 2020-09-07 ENCOUNTER — Encounter (HOSPITAL_COMMUNITY): Payer: Self-pay | Admitting: *Deleted

## 2020-09-07 ENCOUNTER — Inpatient Hospital Stay (HOSPITAL_COMMUNITY)
Admission: EM | Admit: 2020-09-07 | Discharge: 2020-09-12 | DRG: 906 | Disposition: A | Discharge status: Home Health | Payer: Medicare Other | Attending: Internal Medicine | Admitting: Internal Medicine

## 2020-09-07 ENCOUNTER — Other Ambulatory Visit: Payer: Self-pay

## 2020-09-07 DIAGNOSIS — Z808 Family history of malignant neoplasm of other organs or systems: Secondary | ICD-10-CM

## 2020-09-07 DIAGNOSIS — Z9119 Patient's noncompliance with other medical treatment and regimen: Secondary | ICD-10-CM

## 2020-09-07 DIAGNOSIS — R571 Hypovolemic shock: Secondary | ICD-10-CM | POA: Diagnosis present

## 2020-09-07 DIAGNOSIS — F05 Delirium due to known physiological condition: Secondary | ICD-10-CM | POA: Diagnosis not present

## 2020-09-07 DIAGNOSIS — F1410 Cocaine abuse, uncomplicated: Secondary | ICD-10-CM | POA: Diagnosis present

## 2020-09-07 DIAGNOSIS — T391X4A Poisoning by 4-Aminophenol derivatives, undetermined, initial encounter: Secondary | ICD-10-CM | POA: Diagnosis not present

## 2020-09-07 DIAGNOSIS — Z800 Family history of malignant neoplasm of digestive organs: Secondary | ICD-10-CM

## 2020-09-07 DIAGNOSIS — N2581 Secondary hyperparathyroidism of renal origin: Secondary | ICD-10-CM | POA: Diagnosis present

## 2020-09-07 DIAGNOSIS — I120 Hypertensive chronic kidney disease with stage 5 chronic kidney disease or end stage renal disease: Secondary | ICD-10-CM | POA: Diagnosis present

## 2020-09-07 DIAGNOSIS — B1910 Unspecified viral hepatitis B without hepatic coma: Secondary | ICD-10-CM | POA: Diagnosis present

## 2020-09-07 DIAGNOSIS — Z20822 Contact with and (suspected) exposure to covid-19: Secondary | ICD-10-CM | POA: Diagnosis present

## 2020-09-07 DIAGNOSIS — K219 Gastro-esophageal reflux disease without esophagitis: Secondary | ICD-10-CM | POA: Diagnosis present

## 2020-09-07 DIAGNOSIS — D72829 Elevated white blood cell count, unspecified: Secondary | ICD-10-CM | POA: Diagnosis not present

## 2020-09-07 DIAGNOSIS — G9341 Metabolic encephalopathy: Secondary | ICD-10-CM | POA: Diagnosis not present

## 2020-09-07 DIAGNOSIS — J449 Chronic obstructive pulmonary disease, unspecified: Secondary | ICD-10-CM | POA: Diagnosis present

## 2020-09-07 DIAGNOSIS — Z6822 Body mass index (BMI) 22.0-22.9, adult: Secondary | ICD-10-CM

## 2020-09-07 DIAGNOSIS — R791 Abnormal coagulation profile: Secondary | ICD-10-CM | POA: Diagnosis not present

## 2020-09-07 DIAGNOSIS — N186 End stage renal disease: Secondary | ICD-10-CM | POA: Diagnosis present

## 2020-09-07 DIAGNOSIS — I472 Ventricular tachycardia: Secondary | ICD-10-CM | POA: Diagnosis not present

## 2020-09-07 DIAGNOSIS — R4587 Impulsiveness: Secondary | ICD-10-CM | POA: Diagnosis not present

## 2020-09-07 DIAGNOSIS — Z955 Presence of coronary angioplasty implant and graft: Secondary | ICD-10-CM | POA: Diagnosis not present

## 2020-09-07 DIAGNOSIS — Z79891 Long term (current) use of opiate analgesic: Secondary | ICD-10-CM

## 2020-09-07 DIAGNOSIS — I498 Other specified cardiac arrhythmias: Secondary | ICD-10-CM | POA: Diagnosis not present

## 2020-09-07 DIAGNOSIS — K7460 Unspecified cirrhosis of liver: Secondary | ICD-10-CM | POA: Diagnosis present

## 2020-09-07 DIAGNOSIS — R9431 Abnormal electrocardiogram [ECG] [EKG]: Secondary | ICD-10-CM | POA: Diagnosis present

## 2020-09-07 DIAGNOSIS — R188 Other ascites: Secondary | ICD-10-CM | POA: Diagnosis present

## 2020-09-07 DIAGNOSIS — M792 Neuralgia and neuritis, unspecified: Secondary | ICD-10-CM

## 2020-09-07 DIAGNOSIS — E875 Hyperkalemia: Secondary | ICD-10-CM | POA: Diagnosis not present

## 2020-09-07 DIAGNOSIS — Z79899 Other long term (current) drug therapy: Secondary | ICD-10-CM

## 2020-09-07 DIAGNOSIS — Z886 Allergy status to analgesic agent status: Secondary | ICD-10-CM

## 2020-09-07 DIAGNOSIS — R140 Abdominal distension (gaseous): Secondary | ICD-10-CM | POA: Diagnosis present

## 2020-09-07 DIAGNOSIS — K7031 Alcoholic cirrhosis of liver with ascites: Secondary | ICD-10-CM | POA: Diagnosis not present

## 2020-09-07 DIAGNOSIS — F1010 Alcohol abuse, uncomplicated: Secondary | ICD-10-CM | POA: Diagnosis present

## 2020-09-07 DIAGNOSIS — I252 Old myocardial infarction: Secondary | ICD-10-CM

## 2020-09-07 DIAGNOSIS — F1510 Other stimulant abuse, uncomplicated: Secondary | ICD-10-CM | POA: Diagnosis present

## 2020-09-07 DIAGNOSIS — I700 Atherosclerosis of aorta: Secondary | ICD-10-CM | POA: Diagnosis present

## 2020-09-07 DIAGNOSIS — I4891 Unspecified atrial fibrillation: Secondary | ICD-10-CM | POA: Diagnosis not present

## 2020-09-07 DIAGNOSIS — K649 Unspecified hemorrhoids: Secondary | ICD-10-CM | POA: Diagnosis present

## 2020-09-07 DIAGNOSIS — E162 Hypoglycemia, unspecified: Secondary | ICD-10-CM | POA: Diagnosis present

## 2020-09-07 DIAGNOSIS — F17210 Nicotine dependence, cigarettes, uncomplicated: Secondary | ICD-10-CM | POA: Diagnosis present

## 2020-09-07 DIAGNOSIS — I480 Paroxysmal atrial fibrillation: Secondary | ICD-10-CM | POA: Diagnosis present

## 2020-09-07 DIAGNOSIS — E872 Acidosis: Secondary | ICD-10-CM | POA: Diagnosis present

## 2020-09-07 DIAGNOSIS — M868X4 Other osteomyelitis, hand: Secondary | ICD-10-CM | POA: Diagnosis present

## 2020-09-07 DIAGNOSIS — I2510 Atherosclerotic heart disease of native coronary artery without angina pectoris: Secondary | ICD-10-CM | POA: Diagnosis present

## 2020-09-07 DIAGNOSIS — Z885 Allergy status to narcotic agent status: Secondary | ICD-10-CM

## 2020-09-07 DIAGNOSIS — E440 Moderate protein-calorie malnutrition: Secondary | ICD-10-CM | POA: Diagnosis present

## 2020-09-07 DIAGNOSIS — T391X1A Poisoning by 4-Aminophenol derivatives, accidental (unintentional), initial encounter: Secondary | ICD-10-CM | POA: Diagnosis present

## 2020-09-07 DIAGNOSIS — I447 Left bundle-branch block, unspecified: Secondary | ICD-10-CM | POA: Diagnosis present

## 2020-09-07 DIAGNOSIS — B181 Chronic viral hepatitis B without delta-agent: Secondary | ICD-10-CM | POA: Diagnosis present

## 2020-09-07 DIAGNOSIS — I10 Essential (primary) hypertension: Secondary | ICD-10-CM | POA: Diagnosis present

## 2020-09-07 DIAGNOSIS — M1990 Unspecified osteoarthritis, unspecified site: Secondary | ICD-10-CM | POA: Diagnosis present

## 2020-09-07 DIAGNOSIS — Z992 Dependence on renal dialysis: Secondary | ICD-10-CM

## 2020-09-07 DIAGNOSIS — E861 Hypovolemia: Secondary | ICD-10-CM | POA: Diagnosis not present

## 2020-09-07 DIAGNOSIS — F419 Anxiety disorder, unspecified: Secondary | ICD-10-CM | POA: Diagnosis present

## 2020-09-07 DIAGNOSIS — Z89021 Acquired absence of right finger(s): Secondary | ICD-10-CM

## 2020-09-07 DIAGNOSIS — Z9115 Patient's noncompliance with renal dialysis: Secondary | ICD-10-CM

## 2020-09-07 DIAGNOSIS — I272 Pulmonary hypertension, unspecified: Secondary | ICD-10-CM | POA: Diagnosis present

## 2020-09-07 DIAGNOSIS — E860 Dehydration: Secondary | ICD-10-CM | POA: Diagnosis present

## 2020-09-07 DIAGNOSIS — Z825 Family history of asthma and other chronic lower respiratory diseases: Secondary | ICD-10-CM

## 2020-09-07 DIAGNOSIS — I70228 Atherosclerosis of native arteries of extremities with rest pain, other extremity: Secondary | ICD-10-CM | POA: Diagnosis present

## 2020-09-07 DIAGNOSIS — I70268 Atherosclerosis of native arteries of extremities with gangrene, other extremity: Secondary | ICD-10-CM | POA: Diagnosis present

## 2020-09-07 DIAGNOSIS — G8929 Other chronic pain: Secondary | ICD-10-CM | POA: Diagnosis present

## 2020-09-07 DIAGNOSIS — D631 Anemia in chronic kidney disease: Secondary | ICD-10-CM | POA: Diagnosis present

## 2020-09-07 DIAGNOSIS — Z801 Family history of malignant neoplasm of trachea, bronchus and lung: Secondary | ICD-10-CM

## 2020-09-07 DIAGNOSIS — Z8249 Family history of ischemic heart disease and other diseases of the circulatory system: Secondary | ICD-10-CM

## 2020-09-07 DIAGNOSIS — R7989 Other specified abnormal findings of blood chemistry: Secondary | ICD-10-CM | POA: Diagnosis present

## 2020-09-07 DIAGNOSIS — Z9109 Other allergy status, other than to drugs and biological substances: Secondary | ICD-10-CM

## 2020-09-07 DIAGNOSIS — F1210 Cannabis abuse, uncomplicated: Secondary | ICD-10-CM | POA: Diagnosis present

## 2020-09-07 DIAGNOSIS — R451 Restlessness and agitation: Secondary | ICD-10-CM | POA: Diagnosis not present

## 2020-09-07 LAB — CBC WITH DIFFERENTIAL/PLATELET
Abs Immature Granulocytes: 0.11 10*3/uL — ABNORMAL HIGH (ref 0.00–0.07)
Basophils Absolute: 0.1 10*3/uL (ref 0.0–0.1)
Basophils Relative: 1 %
Eosinophils Absolute: 0 10*3/uL (ref 0.0–0.5)
Eosinophils Relative: 0 %
HCT: 35.6 % — ABNORMAL LOW (ref 39.0–52.0)
Hemoglobin: 12.7 g/dL — ABNORMAL LOW (ref 13.0–17.0)
Immature Granulocytes: 1 %
Lymphocytes Relative: 7 %
Lymphs Abs: 1.2 10*3/uL (ref 0.7–4.0)
MCH: 30.3 pg (ref 26.0–34.0)
MCHC: 35.7 g/dL (ref 30.0–36.0)
MCV: 85 fL (ref 80.0–100.0)
Monocytes Absolute: 0.9 10*3/uL (ref 0.1–1.0)
Monocytes Relative: 6 %
Neutro Abs: 14 10*3/uL — ABNORMAL HIGH (ref 1.7–7.7)
Neutrophils Relative %: 85 %
Platelets: 296 10*3/uL (ref 150–400)
RBC: 4.19 MIL/uL — ABNORMAL LOW (ref 4.22–5.81)
RDW: 16.6 % — ABNORMAL HIGH (ref 11.5–15.5)
WBC: 16.3 10*3/uL — ABNORMAL HIGH (ref 4.0–10.5)
nRBC: 0.7 % — ABNORMAL HIGH (ref 0.0–0.2)

## 2020-09-07 LAB — PROTIME-INR
INR: 1.9 — ABNORMAL HIGH (ref 0.8–1.2)
Prothrombin Time: 21.5 seconds — ABNORMAL HIGH (ref 11.4–15.2)

## 2020-09-07 LAB — RESP PANEL BY RT-PCR (FLU A&B, COVID) ARPGX2
Influenza A by PCR: NEGATIVE
Influenza B by PCR: NEGATIVE
SARS Coronavirus 2 by RT PCR: NEGATIVE

## 2020-09-07 LAB — I-STAT VENOUS BLOOD GAS, ED
Acid-base deficit: 22 mmol/L — ABNORMAL HIGH (ref 0.0–2.0)
Bicarbonate: 7.8 mmol/L — ABNORMAL LOW (ref 20.0–28.0)
Calcium, Ion: 1.07 mmol/L — ABNORMAL LOW (ref 1.15–1.40)
HCT: 43 % (ref 39.0–52.0)
Hemoglobin: 14.6 g/dL (ref 13.0–17.0)
O2 Saturation: 41 %
Potassium: 6.4 mmol/L (ref 3.5–5.1)
Sodium: 132 mmol/L — ABNORMAL LOW (ref 135–145)
TCO2: 9 mmol/L — ABNORMAL LOW (ref 22–32)
pCO2, Ven: 28.4 mmHg — ABNORMAL LOW (ref 44.0–60.0)
pH, Ven: 7.045 — CL (ref 7.250–7.430)
pO2, Ven: 33 mmHg (ref 32.0–45.0)

## 2020-09-07 LAB — COMPREHENSIVE METABOLIC PANEL
ALT: 106 U/L — ABNORMAL HIGH (ref 0–44)
AST: 285 U/L — ABNORMAL HIGH (ref 15–41)
Albumin: 3.7 g/dL (ref 3.5–5.0)
Alkaline Phosphatase: 268 U/L — ABNORMAL HIGH (ref 38–126)
Anion gap: 38 — ABNORMAL HIGH (ref 5–15)
BUN: 90 mg/dL — ABNORMAL HIGH (ref 6–20)
CO2: 7 mmol/L — ABNORMAL LOW (ref 22–32)
Calcium: 9.5 mg/dL (ref 8.9–10.3)
Chloride: 88 mmol/L — ABNORMAL LOW (ref 98–111)
Creatinine, Ser: 14.46 mg/dL — ABNORMAL HIGH (ref 0.61–1.24)
GFR, Estimated: 4 mL/min — ABNORMAL LOW (ref >60)
Glucose, Bld: 56 mg/dL — ABNORMAL LOW (ref 70–99)
Potassium: 6.6 mmol/L (ref 3.5–5.1)
Sodium: 133 mmol/L — ABNORMAL LOW (ref 135–145)
Total Bilirubin: 1.4 mg/dL — ABNORMAL HIGH (ref 0.3–1.2)
Total Protein: 8.2 g/dL — ABNORMAL HIGH (ref 6.5–8.1)

## 2020-09-07 LAB — LACTIC ACID, PLASMA
Lactic Acid, Venous: 2.8 mmol/L (ref 0.5–1.9)
Lactic Acid, Venous: 2.6 mmol/L (ref 0.5–1.9)

## 2020-09-07 LAB — GLUCOSE, CAPILLARY: Glucose-Capillary: 148 mg/dL — ABNORMAL HIGH (ref 70–99)

## 2020-09-07 LAB — ACETAMINOPHEN LEVEL: Acetaminophen (Tylenol), Serum: 18 ug/mL (ref 10–30)

## 2020-09-07 LAB — SALICYLATE LEVEL: Salicylate Lvl: <7 mg/dL — ABNORMAL LOW (ref 7.0–30.0)

## 2020-09-07 LAB — APTT: aPTT: 60 seconds — ABNORMAL HIGH (ref 24–36)

## 2020-09-07 MED ORDER — DILTIAZEM HCL ER COATED BEADS 120 MG PO CP24: 120.0000 mg | ORAL_CAPSULE | Freq: Every day | ORAL | Status: DC

## 2020-09-07 MED ORDER — CHLORHEXIDINE GLUCONATE CLOTH 2 % EX PADS
6.0000 | MEDICATED_PAD | Freq: Every day | CUTANEOUS | Status: DC
  Administered 2020-09-08 – 2020-09-09 (×2): 6 via TOPICAL

## 2020-09-07 MED ORDER — SODIUM CHLORIDE 0.9 % IV BOLUS: 500.0000 mL | Freq: Once | INTRAVENOUS | Status: DC

## 2020-09-07 MED ORDER — DEXTROSE 50 % IV SOLN
INTRAVENOUS | Status: AC
  Filled 2020-09-07: qty 50

## 2020-09-07 MED ORDER — SEVELAMER CARBONATE 800 MG PO TABS
3200.0000 mg | ORAL_TABLET | Freq: Three times a day (TID) | ORAL | Status: DC
  Filled 2020-09-07 (×3): qty 4

## 2020-09-07 MED ORDER — SODIUM CHLORIDE 0.9 % IV SOLN
1.0000 g | INTRAVENOUS | Status: DC
  Administered 2020-09-08: 1 g via INTRAVENOUS
  Filled 2020-09-07 (×2): qty 1

## 2020-09-07 MED ORDER — DEXTROSE 50 % IV SOLN
1.0000 | Freq: Once | INTRAVENOUS | Status: AC
  Administered 2020-09-07: 50 mL via INTRAVENOUS

## 2020-09-07 MED ORDER — ONDANSETRON HCL 4 MG/2ML IJ SOLN: 4.0000 mg | Freq: Once | INTRAMUSCULAR | Status: DC

## 2020-09-07 MED ORDER — ACETYLCYSTEINE LOAD VIA INFUSION
150.0000 mg/kg | Freq: Once | INTRAVENOUS | Status: AC
  Administered 2020-09-07: 10200 mg via INTRAVENOUS
  Filled 2020-09-07: qty 255

## 2020-09-07 MED ORDER — NOREPINEPHRINE 4 MG/250ML-% IV SOLN
0.0000 ug/min | INTRAVENOUS | Status: DC
  Administered 2020-09-08 (×2): 2 ug/min via INTRAVENOUS
  Filled 2020-09-07 (×2): qty 250

## 2020-09-07 MED ORDER — VANCOMYCIN HCL IN DEXTROSE 750-5 MG/150ML-% IV SOLN
750.0000 mg | INTRAVENOUS | Status: DC
  Filled 2020-09-07: qty 150

## 2020-09-07 MED ORDER — CALCIUM GLUCONATE-NACL 2-0.675 GM/100ML-% IV SOLN
2.0000 g | Freq: Once | INTRAVENOUS | Status: AC
  Administered 2020-09-07: 2000 mg via INTRAVENOUS
  Filled 2020-09-07: qty 100

## 2020-09-07 MED ORDER — VANCOMYCIN HCL 1250 MG/250ML IV SOLN
1250.0000 mg | Freq: Once | INTRAVENOUS | Status: AC
  Administered 2020-09-08: 1250 mg via INTRAVENOUS
  Filled 2020-09-07: qty 250

## 2020-09-07 MED ORDER — DEXTROSE 5 % IV SOLN
15.0000 mg/kg/h | INTRAVENOUS | Status: DC
  Administered 2020-09-07 – 2020-09-09 (×3): 15 mg/kg/h via INTRAVENOUS
  Filled 2020-09-07 (×3): qty 120

## 2020-09-07 MED ORDER — LACTATED RINGERS IV BOLUS: 500.0000 mL | Freq: Once | INTRAVENOUS | Status: DC

## 2020-09-07 MED ORDER — METOPROLOL SUCCINATE ER 25 MG PO TB24: 25.0000 mg | ORAL_TABLET | Freq: Every day | ORAL | Status: DC

## 2020-09-07 MED ORDER — INSULIN ASPART 100 UNIT/ML IV SOLN: 10.0000 [IU] | Freq: Once | INTRAVENOUS | Status: DC

## 2020-09-07 MED ORDER — SODIUM BICARBONATE 8.4 % IV SOLN
50.0000 meq | Freq: Once | INTRAVENOUS | Status: AC
  Administered 2020-09-07: 50 meq via INTRAVENOUS
  Filled 2020-09-07: qty 50

## 2020-09-07 MED ORDER — ACETYLCYSTEINE LOAD VIA INFUSION: 150.0000 mg/kg | Freq: Once | INTRAVENOUS | Status: DC

## 2020-09-07 NOTE — ED Triage Notes (Signed)
The pt arrived approx one hour ago by [facility] he is a dialysis pt that had surgery Wednesday this past week  Finger surgery.  He ran out of his narcotic pain med and he started taking tylenol for the pain  He may have ingested 86,000mg  of tylenol.  Pt c/o nausea  Pt refused iv from [facility].  Dialysis fistula  Rt arm  Pt unco-operative arguing about all treatments

## 2020-09-07 NOTE — H&P (Addendum)
History and Physical    [name] HER:[id number] DOB: [birth year] DOA: 09/07/2020  PCP: Charlott Rakes, MD  Patient coming from: Home.  Chief Complaint: Increasing pain and Tylenol overdose.  HPI: [name] is a 60 y.o. male with history of ESRD on hemodialysis, CAD status post stenting, atrial fibrillation, history of cirrhosis and hepatitis B who had recent amputation of 2 fingers of his right hand presents to the ER after patient had taken increased dose of Tylenol which may be amounting to around 48,000 g.  Patient also has been having increasing pain and had ran out of his narcotics.  He had missed his dialysis on Friday.  Denies any chest pain abdominal pain did have some nausea and vomiting.  ED Course: In the ER labs show Tylenol level of 18 salicylate was less than 7 WBC count was 16 chest x-ray unremarkable LFTs were to 85 and 106 with bicarb of 7 and potassium was 6.6 with EKG showing sinus rhythm with interventricular conduction delay.  On-call nephrologist Dr. Elissa Hefty was consulted for dialysis.  And poison control was contacted and patient was started on acetylcysteine.  On my exam patient is alert awake and oriented to his name and place following commands moving all extremities.  Abdomen mildly distended but otherwise appears dehydrated.  Review of Systems: As per HPI, rest all negative.   Past Medical History:  Diagnosis Date  . Anemia   . Anxiety   . Arthritis    left shoulder  . Atherosclerosis of aorta ([place])   . Cardiomegaly   . Chest pain    DATE UNKNOWN, C/O PERIODICALLY  . Cocaine abuse ([place])   . COPD exacerbation ([place]) 08/17/2016  . Coronary artery disease    stent 02/22/17  . Dysrhythmia   . ESRD (end stage renal disease) on dialysis ([place])    "[name]; MWF" (07/04/2017)  . GERD (gastroesophageal reflux disease)    DATE UNKNOWN  . Hemorrhoids   . Hepatitis B, chronic ([place])   . Hepatitis C   . History of kidney stones   .  Hyperkalemia   . Hypertension   . Kidney failure   . Metabolic bone disease    Patient denies  . Mitral stenosis   . Myocardial infarction ([place])   . Pneumonia   . Pulmonary edema   . Solitary rectal ulcer syndrome 07/2017   at flex sig for rectal bleeding  . Tubular adenoma of colon     Past Surgical History:  Procedure Laterality Date  . A/V FISTULAGRAM Left 05/26/2017   Procedure: A/V FISTULAGRAM;  Surgeon: Conrad [place], MD;  Location: [facility];  Service: Cardiovascular;  Laterality: Left;  . A/V FISTULAGRAM Right 11/18/2017   Procedure: A/V FISTULAGRAM - Right Arm;  Surgeon: Elam Dutch, MD;  Location: [facility];  Service: Cardiovascular;  Laterality: Right;  . AMPUTATION Right 09/04/2020   Procedure: RIGHT INDEX AND RIGHT RING FINGER AMPUTATION DIGIT;  Surgeon: Leanora Cover, MD;  Location: [facility];  Service: Orthopedics;  Laterality: Right;  . APPLICATION OF WOUND VAC Left 06/14/2017   Procedure: APPLICATION OF WOUND VAC;  Surgeon: Katha Cabal, MD;  Location: [facility];  Service: Vascular;  Laterality: Left;  . AV FISTULA PLACEMENT  2012   BELIEVED WAS PLACED IN JUNE  . AV FISTULA PLACEMENT Right 08/09/2017   Procedure: Creation Right arm ARTERIOVENOUS BRACHIOCEPOHALIC FISTULA;  Surgeon: Elam Dutch, MD;  Location: [facility];  Service: Vascular;  Laterality: Right;  . AV FISTULA PLACEMENT Right 11/22/2017   Procedure: INSERTION OF ARTERIOVENOUS (AV) GORE-TEX GRAFT RIGHT UPPER ARM;  Surgeon: Elam Dutch, MD;  Location: [facility];  Service: Vascular;  Laterality: Right;  . BIOPSY  01/25/2018   Procedure: BIOPSY;  Surgeon: Jerene Bears, MD;  Location: [facility];  Service: Gastroenterology;;  . BIOPSY  04/10/2019   Procedure: BIOPSY;  Surgeon: Jerene Bears, MD;  Location: [facility];  Service: Gastroenterology;;  . COLONOSCOPY    . COLONOSCOPY WITH PROPOFOL N/A 01/25/2018   Procedure: COLONOSCOPY WITH PROPOFOL;  Surgeon: Jerene Bears, MD;   Location: [facility];  Service: Gastroenterology;  Laterality: N/A;  . CORONARY STENT INTERVENTION N/A 02/22/2017   Procedure: CORONARY STENT INTERVENTION;  Surgeon: Nigel Mormon, MD;  Location: [facility];  Service: Cardiovascular;  Laterality: N/A;  . ESOPHAGOGASTRODUODENOSCOPY (EGD) WITH PROPOFOL N/A 01/25/2018   Procedure: ESOPHAGOGASTRODUODENOSCOPY (EGD) WITH PROPOFOL;  Surgeon: Jerene Bears, MD;  Location: [facility];  Service: Gastroenterology;  Laterality: N/A;  . ESOPHAGOGASTRODUODENOSCOPY (EGD) WITH PROPOFOL N/A 04/10/2019   Procedure: ESOPHAGOGASTRODUODENOSCOPY (EGD) WITH PROPOFOL;  Surgeon: Jerene Bears, MD;  [facility];  Service: Gastroenterology;  Laterality: N/A;  . FLEXIBLE SIGMOIDOSCOPY N/A 07/15/2017   Procedure: FLEXIBLE SIGMOIDOSCOPY;  Surgeon: Carol Ada, MD;  Location: [facility];  Service: Endoscopy;  Laterality: N/A;  . HEMORRHOID BANDING    . I & D EXTREMITY Left 06/01/2017   Procedure: IRRIGATION AND DEBRIDEMENT LEFT ARM HEMATOMA WITH LIGATION OF LEFT ARM AV FISTULA;  Surgeon: Elam Dutch, MD;  Location: [facility];  Service: Vascular;  Laterality: Left;  . I & D EXTREMITY Left 06/14/2017   Procedure: IRRIGATION AND DEBRIDEMENT EXTREMITY;  Surgeon: Katha Cabal, MD;  Location: [facility];  Service: Vascular;  Laterality: Left;  . INSERTION OF DIALYSIS CATHETER  05/30/2017  . INSERTION OF DIALYSIS CATHETER N/A 05/30/2017   Procedure: INSERTION OF DIALYSIS CATHETER;  Surgeon: Elam Dutch, MD;  Location: [facility];  Service: Vascular;  Laterality: N/A;  . IR PARACENTESIS  08/30/2017  . IR PARACENTESIS  09/29/2017  . IR PARACENTESIS  10/28/2017  . IR PARACENTESIS  11/09/2017  . IR PARACENTESIS  11/16/2017  . IR PARACENTESIS  11/28/2017  . IR PARACENTESIS  12/01/2017  . IR PARACENTESIS  12/06/2017  . IR PARACENTESIS  01/03/2018  . IR PARACENTESIS  01/23/2018  . IR PARACENTESIS  02/07/2018  . IR PARACENTESIS  02/21/2018  . IR PARACENTESIS   03/06/2018  . IR PARACENTESIS  03/17/2018  . IR PARACENTESIS  04/04/2018  . IR PARACENTESIS  12/28/2018  . IR PARACENTESIS  01/08/2019  . IR PARACENTESIS  01/23/2019  . IR PARACENTESIS  02/01/2019  . IR PARACENTESIS  02/19/2019  . IR PARACENTESIS  03/01/2019  . IR PARACENTESIS  03/15/2019  . IR PARACENTESIS  04/03/2019  . IR PARACENTESIS  04/12/2019  . IR PARACENTESIS  05/01/2019  . IR PARACENTESIS  05/08/2019  . IR PARACENTESIS  05/24/2019  . IR PARACENTESIS  06/12/2019  . IR PARACENTESIS  07/09/2019  . IR PARACENTESIS  07/27/2019  . IR PARACENTESIS  08/09/2019  . IR PARACENTESIS  08/21/2019  . IR PARACENTESIS  09/17/2019  . IR PARACENTESIS  10/05/2019  . IR PARACENTESIS  10/29/2019  . IR PARACENTESIS  11/08/2019  . IR PARACENTESIS  12/12/2019  . IR PARACENTESIS  01/03/2020  . IR PARACENTESIS  01/10/2020  . IR PARACENTESIS  01/17/2020  . IR PARACENTESIS  01/24/2020  .  IR PARACENTESIS  01/31/2020  . IR PARACENTESIS  02/07/2020  . IR PARACENTESIS  02/21/2020  . IR PARACENTESIS  05/21/2020  . IR RADIOLOGIST EVAL & MGMT  02/14/2018  . IR RADIOLOGIST EVAL & MGMT  02/22/2019  . LEFT HEART CATH AND CORONARY ANGIOGRAPHY N/A 02/22/2017   Procedure: LEFT HEART CATH AND CORONARY ANGIOGRAPHY;  Surgeon: Nigel Mormon, MD;  Location: [facility];  Service: Cardiovascular;  Laterality: N/A;  . LIGATION OF ARTERIOVENOUS  FISTULA Left 0/03/3817   Procedure: Plication of Left Arm Arteriovenous Fistula;  Surgeon: Elam Dutch, MD;  Location: [place];  Service: Vascular;  Laterality: Left;  . POLYPECTOMY    . POLYPECTOMY  01/25/2018   Procedure: POLYPECTOMY;  Surgeon: Jerene Bears, MD;  Location: [place];  Service: Gastroenterology;;  . REVISON OF ARTERIOVENOUS FISTULA Left 2/99/3716   Procedure: PLICATION OF DISTAL ANEURYSMAL SEGEMENT OF LEFT UPPER ARM ARTERIOVENOUS FISTULA;  Surgeon: Elam Dutch, MD;  Location: [facility];  Service: Vascular;  Laterality: Left;  . REVISON OF ARTERIOVENOUS FISTULA Left  9/67/8938   Procedure: Plication of Left Upper Arm Fistula ;  Surgeon: Waynetta Sandy, MD;  Location: [place];  Service: Vascular;  Laterality: Left;  . SKIN GRAFT SPLIT THICKNESS LEG / FOOT Left    SKIN GRAFT SPLIT THICKNESS LEFT ARM DONOR SITE: LEFT ANTERIOR THIGH  . SKIN SPLIT GRAFT Left 07/04/2017   Procedure: SKIN GRAFT SPLIT THICKNESS LEFT ARM DONOR SITE: LEFT ANTERIOR THIGH;  Surgeon: Elam Dutch, MD;  Location: [facility];  Service: Vascular;  Laterality: Left;  . THROMBECTOMY W/ EMBOLECTOMY Left 06/05/2017   Procedure: EXPLORATION OF LEFT ARM FOR BLEEDING; OVERSEWED PROXIMAL FISTULA;  Surgeon: Angelia Mould, MD;  Location: [facility];  Service: Vascular;  Laterality: Left;  . WOUND EXPLORATION Left 06/03/2017   Procedure: WOUND EXPLORATION WITH WOUND VAC APPLICATION TO LEFT ARM;  Surgeon: Angelia Mould, MD;  Location: [place];  Service: Vascular;  Laterality: Left;     reports that he has been smoking cigarettes. He started smoking about 47 years ago. He has a 43.00 pack-year smoking history. He has never used smokeless tobacco. He reports previous alcohol use. He reports current drug use. Drugs: Marijuana and Cocaine.  Allergies  Allergen Reactions  . Tramadol Itching and Other (See Comments)    Other reaction(s): Unknown  . Grass Extracts [Gramineae Pollens]     Other reaction(s): Sneezing  . Morphine And Related Other (See Comments)    Stomach pain  . Pollen Extract Other (See Comments)    Other reaction(s): Sneezing (finding)  . Acetaminophen Nausea Only    Stomach ache   . Aspirin Itching and Other (See Comments)    STOMACH PAIN  Other reaction(s): Unknown  . Clonidine Derivatives Itching    Family History  Problem Relation Age of Onset  . Heart disease Mother   . Lung cancer Mother   . Heart disease Father   . Malignant hyperthermia Father   . COPD Father   . Throat cancer Sister   . Esophageal cancer Sister   . Hypertension Other   .  COPD Other   . Throat cancer Sister   . Colon cancer Neg Hx   . Colon polyps Neg Hx   . Rectal cancer Neg Hx   . Stomach cancer Neg Hx     Prior to Admission medications   Medication Sig Start Date End Date Taking? Authorizing Provider  albuterol (VENTOLIN HFA) 108 (90 Base)  MCG/ACT inhaler Inhale 2 puffs into the lungs every 4 (four) hours as needed for wheezing or shortness of breath. 07/10/20   Charlott Rakes, MD  amiodarone (PACERONE) 200 MG tablet Take 1 tablet (200 mg total) by mouth daily. 07/22/20   Cantwell, Celeste C, PA-C  cetirizine (ZYRTEC) 10 MG tablet Take 10 mg by mouth daily as needed. 06/23/20   [provider]  ciclopirox (PENLAC) 8 % solution Apply topically at bedtime. For 8 weeks Patient not taking: No sig reported 07/16/20   Charlott Rakes, MD  diltiazem (CARDIZEM CD) 120 MG 24 hr capsule TAKE 1 CAPSULE BY MOUTH EVERY DAY Patient taking differently: Take 120 mg by mouth daily. 06/11/20   Adrian Prows, MD  diphenhydrAMINE (BENADRYL) 25 mg capsule Take 1 capsule (25 mg total) by mouth every 6 (six) hours as needed for itching or allergies. Patient not taking: Reported on 09/03/2020 01/29/20   Shelly Coss, MD  gabapentin (NEURONTIN) 300 MG capsule Take 1 capsule (300 mg total) by mouth in the morning and at bedtime. 06/26/20   Charlott Rakes, MD  metoprolol succinate (TOPROL XL) 25 MG 24 hr tablet Take 1 tablet (25 mg total) by mouth daily. 07/22/20   Cantwell, Celeste C, PA-C  ondansetron (ZOFRAN) 4 MG tablet TAKE 1 TABLET BY MOUTH EVERY 8 HOURS AS NEEDED FOR NAUSEA AND VOMITING Patient taking differently: Take 4 mg by mouth every 8 (eight) hours as needed for nausea or vomiting. 08/29/20   Charlott Rakes, MD  oxyCODONE (ROXICODONE) 5 MG immediate release tablet 1 tab PO q6 hours prn pain 09/04/20   Leanora Cover, MD  Oxycodone HCl 10 MG TABS Take 10 mg by mouth 3 (three) times daily as needed (pain).  01/24/20   [provider]  pantoprazole (PROTONIX) 40  MG tablet Take 1 tablet (40 mg total) by mouth 2 (two) times daily. 03/25/20   Cantwell, Celeste C, PA-C  sevelamer carbonate (RENVELA) 800 MG tablet Take 2,400 mg by mouth See admin instructions. Take 3 tablets (2400 mg) by mouth up to three times daily with meals    [provider]  tenofovir (VIREAD) 300 MG tablet Take 1 by mouth every Monday. Patient not taking: No sig reported 05/22/20   Delora Fuel, MD    Physical Exam: Constitutional: Moderately built and nourished. Vitals:   09/07/20 1800 09/07/20 1806 09/07/20 1845 09/07/20 1915  BP:  (!) 75/65 104/73 111/69  Pulse:  98 (!) 111 (!) 115  Resp:  10 15 17   SpO2: 90% 96% 98% 98%  Weight:      Height:       Eyes: Anicteric no pallor. ENMT: No discharge from the ears eyes nose or mouth. Neck: No mass felt.  No neck rigidity. Respiratory: No rhonchi or crepitations. Cardiovascular: S1-S2 heard. Abdomen: Soft nontender mildly distended no guarding or rigidity. Musculoskeletal: No edema.  Right hand is in dressing. Skin: Right hand in dressing. Neurologic: Alert awake oriented to his place and name appears irritable.  Moving all extremities. Psychiatric: Appears irritable.  Denies any suicidal thoughts.   Labs on Admission: I have personally reviewed following labs and imaging studies  CBC: Recent Labs  Lab 09/04/20 1245 09/07/20 1731 09/07/20 1744  WBC  --   --  16.3*  NEUTROABS  --   --  14.0*  HGB 12.6* 14.6 12.7*  HCT 37.0* 43.0 35.6*  MCV  --   --  85.0  PLT  --   --  296   Basic  Metabolic Panel: Recent Labs  Lab 09/04/20 1245 09/07/20 1731 09/07/20 1744  NA 135 132* 133*  K 5.3* 6.4* 6.6*  CL 97*  --  88*  CO2  --   --  7*  GLUCOSE 69*  --  56*  BUN 55*  --  90*  CREATININE 9.40*  --  14.46*  CALCIUM  --   --  9.5   GFR: Estimated Creatinine Clearance: 5.4 mL/min (A) (by C-G formula based on SCr of 14.46 mg/dL (H)). Liver Function Tests: Recent Labs  Lab 09/07/20 1744  AST 285*  ALT  106*  ALKPHOS 268*  BILITOT 1.4*  PROT 8.2*  ALBUMIN 3.7   No results for input(s): LIPASE, AMYLASE in the last 168 hours. No results for input(s): AMMONIA in the last 168 hours. Coagulation Profile: Recent Labs  Lab 09/07/20 1744  INR 1.9*   Cardiac Enzymes: No results for input(s): CKTOTAL, CKMB, CKMBINDEX, TROPONINI in the last 168 hours. BNP (last 3 results) No results for input(s): PROBNP in the last 8760 hours. HbA1C: No results for input(s): HGBA1C in the last 72 hours. CBG: No results for input(s): GLUCAP in the last 168 hours. Lipid Profile: No results for input(s): CHOL, HDL, LDLCALC, TRIG, CHOLHDL, LDLDIRECT in the last 72 hours. Thyroid Function Tests: No results for input(s): TSH, T4TOTAL, FREET4, T3FREE, THYROIDAB in the last 72 hours. Anemia Panel: No results for input(s): VITAMINB12, FOLATE, FERRITIN, TIBC, IRON, RETICCTPCT in the last 72 hours. Urine analysis:    Component Value Date/Time   COLORURINE YELLOW 07/16/2011 1619   APPEARANCEUR CLEAR 07/16/2011 1619   LABSPEC 1.018 07/16/2011 1619   PHURINE 7.5 07/16/2011 1619   GLUCOSEU 250 (A) 07/16/2011 1619   HGBUR MODERATE (A) 07/16/2011 1619   BILIRUBINUR SMALL (A) 07/16/2011 1619   KETONESUR NEGATIVE 07/16/2011 1619   PROTEINUR >300 (A) 07/16/2011 1619   UROBILINOGEN 1.0 07/16/2011 1619   NITRITE NEGATIVE 07/16/2011 1619   LEUKOCYTESUR SMALL (A) 07/16/2011 1619   Sepsis Labs: @LABRCNTIP (procalcitonin:4,lacticidven:4) ) Recent Results (from the past 240 hour(s))  SARS CORONAVIRUS 2 (TAT 6-24 HRS) Nasopharyngeal Nasopharyngeal Swab     Status: None   Collection Time: 09/03/20  9:56 AM   Specimen: Nasopharyngeal Swab  Result Value Ref Range Status   SARS Coronavirus 2 NEGATIVE NEGATIVE Final    Comment: (NOTE) SARS-CoV-2 target nucleic acids are NOT DETECTED.  The SARS-CoV-2 RNA is generally detectable in upper and lower respiratory specimens during the acute phase of infection.  Negative results do not preclude SARS-CoV-2 infection, do not rule out co-infections with other pathogens, and should not be used as the sole basis for treatment or other patient management decisions. Negative results must be combined with clinical observations, patient history, and epidemiological information. The expected result is Negative.  Fact Sheet for Patients: SugarRoll.be  Fact Sheet for Healthcare Providers: https://www.woods-mathews.com/  This test is not yet approved or cleared by the [place] FDA and  has been authorized for detection and/or diagnosis of SARS-CoV-2 by FDA under an Emergency Use Authorization (EUA). This EUA will remain  in effect (meaning this test can be used) for the duration of the COVID-19 declaration under Se ction 564(b)(1) of the Act, 21 U.S.C. section 360bbb-3(b)(1), unless the authorization is terminated or revoked sooner.  Performed at [facility], [place] [address], [place], [place] [address]   Resp Panel by RT-PCR (Flu A&B, Covid) Nasopharyngeal Swab     Status: None   Collection Time: 09/07/20  6:35 PM  Specimen: Nasopharyngeal Swab; Nasopharyngeal(NP) swabs in vial transport medium  Result Value Ref Range Status   SARS Coronavirus 2 by RT PCR NEGATIVE NEGATIVE Final    Comment: (NOTE) SARS-CoV-2 target nucleic acids are NOT DETECTED.  The SARS-CoV-2 RNA is generally detectable in upper respiratory specimens during the acute phase of infection. The lowest concentration of SARS-CoV-2 viral copies this assay can detect is 138 copies/mL. A negative result does not preclude SARS-Cov-2 infection and should not be used as the sole basis for treatment or other patient management decisions. A negative result may occur with  improper specimen collection/handling, submission of specimen other than nasopharyngeal swab, presence of viral mutation(s) within the areas targeted by this assay, and  inadequate number of viral copies(<138 copies/mL). A negative result must be combined with clinical observations, patient history, and epidemiological information. The expected result is Negative.  Fact Sheet for Patients:  EntrepreneurPulse.com.au  Fact Sheet for Healthcare Providers:  IncredibleEmployment.be  This test is no t yet approved or cleared by the [place] FDA and  has been authorized for detection and/or diagnosis of SARS-CoV-2 by FDA under an Emergency Use Authorization (EUA). This EUA will remain  in effect (meaning this test can be used) for the duration of the COVID-19 declaration under Section 564(b)(1) of the Act, 21 U.S.C.section 360bbb-3(b)(1), unless the authorization is terminated  or revoked sooner.       Influenza A by PCR NEGATIVE NEGATIVE Final   Influenza B by PCR NEGATIVE NEGATIVE Final    Comment: (NOTE) The Xpert Xpress SARS-CoV-2/FLU/RSV plus assay is intended as an aid in the diagnosis of influenza from Nasopharyngeal swab specimens and should not be used as a sole basis for treatment. Nasal washings and aspirates are unacceptable for Xpert Xpress SARS-CoV-2/FLU/RSV testing.  Fact Sheet for Patients: EntrepreneurPulse.com.au  Fact Sheet for Healthcare Providers: IncredibleEmployment.be  This test is not yet approved or cleared by the [place] FDA and has been authorized for detection and/or diagnosis of SARS-CoV-2 by FDA under an Emergency Use Authorization (EUA). This EUA will remain in effect (meaning this test can be used) for the duration of the COVID-19 declaration under Section 564(b)(1) of the Act, 21 U.S.C. section 360bbb-3(b)(1), unless the authorization is terminated or revoked.  Performed at [facility], [place] [address], [place],  [address]      Radiological Exams on Admission: DG Chest Portable 1 View  Result Date:  09/07/2020 CLINICAL DATA:  Overdose. EXAM: PORTABLE CHEST 1 VIEW COMPARISON:  June 30, 2020. FINDINGS: Stable cardiomegaly. No pneumothorax or pleural effusion is noted. Both lungs are clear. The visualized skeletal structures are unremarkable. IMPRESSION: No active disease. Electronically Signed   By: Marijo Conception M.D.   On: 09/07/2020 17:38    EKG: Independently reviewed.  Sinus rhythm intraventricular conduction delay.  Assessment/Plan Principal Problem:   Tylenol overdose Active Problems:   Chronic hepatitis B (HCC)   COPD GOLD 0 with flattening on inps f/v    Essential hypertension   Hyperkalemia   ESRD on dialysis (HCC)   Leukocytosis    1. Tylenol overdose patient denies any suicidal thoughts.  Poison control was contacted and started on acetylcysteine.  Repeat LFTs and Tylenol level and INR in 24 hours.  Denies drinking alcohol recently. 2. ESRD with hyperkalemia and metabolic acidosis for which Dr. Burnett Sheng nephrology has been consulted and will be going for urgent dialysis.  Patient be receiving D50 IV insulin bicarb and calcium gluconate. 3. Hypertension we will continue  home medications.  Diltiazem and metoprolol. 4. Leukocytosis could be reactionary.  But however since patient had recent surgery we will get blood cultures start empiric antibiotics. 5. History of A. fib will hold amiodarone due to elevated LFTs and Tylenol overdose. 6. History of CAD status post stenting denies any chest pain. 7. History of COPD presently not wheezing. 8. Recent right hand to finger amputation for necrosis. 9. History of hepatitis B and cirrhosis of liver on antiviral.  When the patient has Tylenol overdose with metabolic acidosis hyperkalemia missed dialysis will need close monitoring for any further worsening in inpatient status.   DVT prophylaxis: SCDs.  Avoiding anticoagulation due to Tylenol overdose. Code Status: Full code. Family Communication: We will need to discuss with  family. Disposition Plan: To be determined. Consults called: Nephrology. Admission status: Inpatient.   Rise Patience MD [facility] Pager [phone].  If 7PM-7AM, please contact night-coverage [email or web address] Password [facility]  09/07/2020, 9:04 PM

## 2020-09-07 NOTE — ED Notes (Signed)
Unable to obtain oral or axillary temp after multiple attempts. Patient refusing rectal temp. Sabra Heck, MD aware.

## 2020-09-07 NOTE — ED Provider Notes (Signed)
[facility] EMERGENCY DEPARTMENT Provider Note   CSN: [id number] Arrival date & time: 09/07/20  1638     History Chief Complaint: acetaminophen overdose  [name] is a 60 y.o. male w/ h/o who presents to ED via EMS for acetaminophen overdose. Underwent hand surgery on 2/24 for ischemic wounds to several fingers. Discharged home with narcotic pain medicine of which he ran out. He then began taking Tylenol for pain control. Reports taking approximately 48,000mg  over a 48 hour period. Reports stopping at sometime yesterday. Today, patient developed N/V/D, prompting him to call EMS who transported patient to ED. Denies SI or HI. Denies alcohol, IVDU, or other coingestions. Patient also reports missing his dialysis session on Friday; no dialysis for the last 5 days. Denies headache, chest pain, SOB, fever, chills, or abdominal pain.  The history is provided by the patient and medical records.  Drug Overdose This is a new problem. The current episode started yesterday. The problem occurs constantly. The problem has been resolved. Pertinent negatives include no chest pain, no abdominal pain, no headaches and no shortness of breath. Nothing aggravates the symptoms. Nothing relieves the symptoms. He has tried nothing for the symptoms.       Past Medical History:  Diagnosis Date  . Anemia   . Anxiety   . Arthritis    left shoulder  . Atherosclerosis of aorta ([place])   . Cardiomegaly   . Chest pain    DATE UNKNOWN, C/O PERIODICALLY  . Cocaine abuse ([place])   . COPD exacerbation ([place]) 08/17/2016  . Coronary artery disease    stent 02/22/17  . Dysrhythmia   . ESRD (end stage renal disease) on dialysis ([place])    "[name]; MWF" (07/04/2017)  . GERD (gastroesophageal reflux disease)    DATE UNKNOWN  . Hemorrhoids   . Hepatitis B, chronic ([place])   . Hepatitis C   . History of kidney stones   . Hyperkalemia   . Hypertension   . Kidney failure   . Metabolic bone  disease    Patient denies  . Mitral stenosis   . Myocardial infarction ([place])   . Pneumonia   . Pulmonary edema   . Solitary rectal ulcer syndrome 07/2017   at flex sig for rectal bleeding  . Tubular adenoma of colon     Patient Active Problem List   Diagnosis Date Noted  . Tylenol overdose 09/07/2020  . DNR (do not resuscitate)   . CAP (community acquired pneumonia) 02/09/2020  . Leukocytosis 01/19/2020  . Tobacco use 01/19/2020  . Acute pulmonary edema ([place]) 12/21/2019  . Acetaminophen overdose 10/27/2019  . ESRD (end stage renal disease) ([place])   . Goals of care, counseling/discussion   . Hypoxia 10/04/2019  . Advanced care planning/counseling discussion   . Renal failure 09/26/2019  . Dyspnea 06/09/2019  . Acquired thrombophilia ([place]) 06/05/2019  . A-fib ([place]) 05/30/2019  . Atrial fibrillation with RVR ([place]) 05/29/2019  . Melena   . Pressure injury of skin 03/09/2019  . Abdominal distention   . Volume overload 12/28/2018  . Sepsis ([place]) 09/12/2018  . Coronary artery disease involving native coronary artery of native heart without angina pectoris 03/11/2018  . Benign neoplasm of cecum   . Benign neoplasm of ascending colon   . Benign neoplasm of descending colon   . Benign neoplasm of rectum   . AF (paroxysmal atrial fibrillation) ([place]) 01/23/2018  . Hx of colonic polyps 01/20/2018  . End-stage renal disease on hemodialysis (  [place]) 11/21/2017  . GERD (gastroesophageal reflux disease) 11/16/2017  . Decompensated hepatic cirrhosis ([place]) 11/15/2017  . Palliative care by specialist   . Hyponatremia 11/04/2017  . SBP (spontaneous bacterial peritonitis) ([place]) 10/30/2017  . Liver disease, chronic 10/30/2017  . SOB (shortness of breath)   . Abdominal pain 10/28/2017  . Upper airway cough syndrome with flattening on f/v loop 10/13/17 c/w vcd 10/17/2017  . Elevated diaphragm 10/13/2017  . Ileus ([place]) 09/29/2017  . QT prolongation 09/29/2017  . Malnutrition of moderate degree  09/29/2017  . Sinus congestion 09/03/2017  . Symptomatic anemia 09/02/2017  . Cirrhosis of liver with ascites ([place]) 09/02/2017  . Left bundle branch block 09/02/2017  . Mitral stenosis 09/02/2017  . Hematochezia 07/15/2017  . Wide-complex tachycardia ([place])   . Endotracheally intubated   . ESRD on dialysis ([place]) 07/04/2017  . CKD (chronic kidney disease) stage V requiring chronic dialysis ([place]) 06/18/2017  . History of Cocaine abuse ([place]) 06/18/2017  . Hypertension 06/18/2017  . Infection of AV graft for dialysis ([place]) 06/18/2017  . Anxiety 06/18/2017  . Anemia due to chronic kidney disease 06/18/2017  . Atypical atrial flutter ([place]) 06/18/2017  . Personality disorder ([place]) 06/13/2017  . Cellulitis 06/12/2017  . Adjustment disorder with mixed anxiety and depressed mood 06/10/2017  . Suicidal ideation 06/10/2017  . Arm wound, left, sequela 06/10/2017  . Dyspnea on exertion 05/29/2017  . Tachycardia 05/29/2017  . Hyperkalemia 05/22/2017  . Acute metabolic encephalopathy   . Anemia 04/23/2017  . Ascites 04/23/2017  . COPD (chronic obstructive pulmonary disease) ([place]) 04/23/2017  . Acute on chronic respiratory failure with hypoxia ([place]) 03/25/2017  . Arrhythmia 03/25/2017  . COPD GOLD 0 with flattening on inps f/v  09/27/2016  . Essential hypertension 09/27/2016  . Fluid overload 08/30/2016  . COPD exacerbation ([place]) 08/17/2016  . Hypertensive urgency 08/17/2016  . Problem with dialysis access ([place]) 07/23/2016  . Chronic hepatitis B ([place]) 03/05/2014  . Chronic hepatitis C without hepatic coma ([place]) 03/05/2014  . Internal hemorrhoids with bleeding, swelling and itching 03/05/2014  . Thrombocytopenia ([place]) 03/05/2014  . Chest pain 02/27/2014  . Alcohol abuse 04/14/2009  . Nicotine dependence, cigarettes, uncomplicated 70/26/3785  . GANGLION CYST 04/14/2009    Past Surgical History:  Procedure Laterality Date  . A/V FISTULAGRAM Left 05/26/2017   Procedure: A/V FISTULAGRAM;   Surgeon: Conrad Augusta, MD;  Location: [place] [facility];  Service: Cardiovascular;  Laterality: Left;  . A/V FISTULAGRAM Right 11/18/2017   Procedure: A/V FISTULAGRAM - Right Arm;  Surgeon: Elam Dutch, MD;  Location: [place] [facility];  Service: Cardiovascular;  Laterality: Right;  . AMPUTATION Right 09/04/2020   Procedure: RIGHT INDEX AND RIGHT RING FINGER AMPUTATION DIGIT;  Surgeon: Leanora Cover, MD;  Location: [place];  Service: Orthopedics;  Laterality: Right;  . APPLICATION OF WOUND VAC Left 06/14/2017   Procedure: APPLICATION OF WOUND VAC;  Surgeon: Katha Cabal, MD;  Location: [facility];  Service: Vascular;  Laterality: Left;  . AV FISTULA PLACEMENT  2012   BELIEVED WAS PLACED IN JUNE  . AV FISTULA PLACEMENT Right 08/09/2017   Procedure: Creation Right arm ARTERIOVENOUS BRACHIOCEPOHALIC FISTULA;  Surgeon: Elam Dutch, MD;  Location: [facility];  Service: Vascular;  Laterality: Right;  . AV FISTULA PLACEMENT Right 11/22/2017   Procedure: INSERTION OF ARTERIOVENOUS (AV) GORE-TEX GRAFT RIGHT UPPER ARM;  Surgeon: Elam Dutch, MD;  Location: [place];  Service: Vascular;  Laterality: Right;  . BIOPSY  01/25/2018  Procedure: BIOPSY;  Surgeon: Jerene Bears, MD;  Location: [place];  Service: Gastroenterology;;  . BIOPSY  04/10/2019   Procedure: BIOPSY;  Surgeon: Jerene Bears, MD;  Location: [facility];  Service: Gastroenterology;;  . COLONOSCOPY    . COLONOSCOPY WITH PROPOFOL N/A 01/25/2018   Procedure: COLONOSCOPY WITH PROPOFOL;  Surgeon: Jerene Bears, MD;  Location: [place];  Service: Gastroenterology;  Laterality: N/A;  . CORONARY STENT INTERVENTION N/A 02/22/2017   Procedure: CORONARY STENT INTERVENTION;  Surgeon: Nigel Mormon, MD;  Location: [place] [facility];  Service: Cardiovascular;  Laterality: N/A;  . ESOPHAGOGASTRODUODENOSCOPY (EGD) WITH PROPOFOL N/A 01/25/2018   Procedure: ESOPHAGOGASTRODUODENOSCOPY (EGD) WITH PROPOFOL;  Surgeon: Jerene Bears, MD;   Location: [place];  Service: Gastroenterology;  Laterality: N/A;  . ESOPHAGOGASTRODUODENOSCOPY (EGD) WITH PROPOFOL N/A 04/10/2019   Procedure: ESOPHAGOGASTRODUODENOSCOPY (EGD) WITH PROPOFOL;  Surgeon: Jerene Bears, MD;  Location: [facility];  Service: Gastroenterology;  Laterality: N/A;  . FLEXIBLE SIGMOIDOSCOPY N/A 07/15/2017   Procedure: FLEXIBLE SIGMOIDOSCOPY;  Surgeon: Carol Ada, MD;  Location: [place];  Service: Endoscopy;  Laterality: N/A;  . HEMORRHOID BANDING    . I & D EXTREMITY Left 06/01/2017   Procedure: IRRIGATION AND DEBRIDEMENT LEFT ARM HEMATOMA WITH LIGATION OF LEFT ARM AV FISTULA;  Surgeon: Elam Dutch, MD;  Location: [place];  Service: Vascular;  Laterality: Left;  . I & D EXTREMITY Left 06/14/2017   Procedure: IRRIGATION AND DEBRIDEMENT EXTREMITY;  Surgeon: Katha Cabal, MD;  Location: [facility];  Service: Vascular;  Laterality: Left;  . INSERTION OF DIALYSIS CATHETER  05/30/2017  . INSERTION OF DIALYSIS CATHETER N/A 05/30/2017   Procedure: INSERTION OF DIALYSIS CATHETER;  Surgeon: Elam Dutch, MD;  Location: [place];  Service: Vascular;  Laterality: N/A;  . IR PARACENTESIS  08/30/2017  . IR PARACENTESIS  09/29/2017  . IR PARACENTESIS  10/28/2017  . IR PARACENTESIS  11/09/2017  . IR PARACENTESIS  11/16/2017  . IR PARACENTESIS  11/28/2017  . IR PARACENTESIS  12/01/2017  . IR PARACENTESIS  12/06/2017  . IR PARACENTESIS  01/03/2018  . IR PARACENTESIS  01/23/2018  . IR PARACENTESIS  02/07/2018  . IR PARACENTESIS  02/21/2018  . IR PARACENTESIS  03/06/2018  . IR PARACENTESIS  03/17/2018  . IR PARACENTESIS  04/04/2018  . IR PARACENTESIS  12/28/2018  . IR PARACENTESIS  01/08/2019  . IR PARACENTESIS  01/23/2019  . IR PARACENTESIS  02/01/2019  . IR PARACENTESIS  02/19/2019  . IR PARACENTESIS  03/01/2019  . IR PARACENTESIS  03/15/2019  . IR PARACENTESIS  04/03/2019  . IR PARACENTESIS  04/12/2019  . IR PARACENTESIS  05/01/2019  . IR PARACENTESIS  05/08/2019  . IR  PARACENTESIS  05/24/2019  . IR PARACENTESIS  06/12/2019  . IR PARACENTESIS  07/09/2019  . IR PARACENTESIS  07/27/2019  . IR PARACENTESIS  08/09/2019  . IR PARACENTESIS  08/21/2019  . IR PARACENTESIS  09/17/2019  . IR PARACENTESIS  10/05/2019  . IR PARACENTESIS  10/29/2019  . IR PARACENTESIS  11/08/2019  . IR PARACENTESIS  12/12/2019  . IR PARACENTESIS  01/03/2020  . IR PARACENTESIS  01/10/2020  . IR PARACENTESIS  01/17/2020  . IR PARACENTESIS  01/24/2020  . IR PARACENTESIS  01/31/2020  . IR PARACENTESIS  02/07/2020  . IR PARACENTESIS  02/21/2020  . IR PARACENTESIS  05/21/2020  . IR RADIOLOGIST EVAL & MGMT  02/14/2018  . IR RADIOLOGIST EVAL & MGMT  02/22/2019  . LEFT HEART  CATH AND CORONARY ANGIOGRAPHY N/A 02/22/2017   Procedure: LEFT HEART CATH AND CORONARY ANGIOGRAPHY;  Surgeon: Nigel Mormon, MD;  Location: [place] [facility];  Service: Cardiovascular;  Laterality: N/A;  . LIGATION OF ARTERIOVENOUS  FISTULA Left 0/03/9832   Procedure: Plication of Left Arm Arteriovenous Fistula;  Surgeon: Elam Dutch, MD;  Location: [place];  Service: Vascular;  Laterality: Left;  . POLYPECTOMY    . POLYPECTOMY  01/25/2018   Procedure: POLYPECTOMY;  Surgeon: Jerene Bears, MD;  Location: [place];  Service: Gastroenterology;;  . REVISON OF ARTERIOVENOUS FISTULA Left 03/05/538   Procedure: PLICATION OF DISTAL ANEURYSMAL SEGEMENT OF LEFT UPPER ARM ARTERIOVENOUS FISTULA;  Surgeon: Elam Dutch, MD;  Location: [place];  Service: Vascular;  Laterality: Left;  . REVISON OF ARTERIOVENOUS FISTULA Left 7/67/3419   Procedure: Plication of Left Upper Arm Fistula ;  Surgeon: Waynetta Sandy, MD;  Location: [place];  Service: Vascular;  Laterality: Left;  . SKIN GRAFT SPLIT THICKNESS LEG / FOOT Left    SKIN GRAFT SPLIT THICKNESS LEFT ARM DONOR SITE: LEFT ANTERIOR THIGH  . SKIN SPLIT GRAFT Left 07/04/2017   Procedure: SKIN GRAFT SPLIT THICKNESS LEFT ARM DONOR SITE: LEFT ANTERIOR THIGH;  Surgeon: Elam Dutch,  MD;  Location: [place];  Service: Vascular;  Laterality: Left;  . THROMBECTOMY W/ EMBOLECTOMY Left 06/05/2017   Procedure: EXPLORATION OF LEFT ARM FOR BLEEDING; OVERSEWED PROXIMAL FISTULA;  Surgeon: Angelia Mould, MD;  Location: [place];  Service: Vascular;  Laterality: Left;  . WOUND EXPLORATION Left 06/03/2017   Procedure: WOUND EXPLORATION WITH WOUND VAC APPLICATION TO LEFT ARM;  Surgeon: Angelia Mould, MD;  Location: [facility];  Service: Vascular;  Laterality: Left;       Family History  Problem Relation Age of Onset  . Heart disease Mother   . Lung cancer Mother   . Heart disease Father   . Malignant hyperthermia Father   . COPD Father   . Throat cancer Sister   . Esophageal cancer Sister   . Hypertension Other   . COPD Other   . Throat cancer Sister   . Colon cancer Neg Hx   . Colon polyps Neg Hx   . Rectal cancer Neg Hx   . Stomach cancer Neg Hx     Social History   Tobacco Use  . Smoking status: Current Every Day Smoker    Packs/day: 1.00    Years: 43.00    Pack years: 43.00    Types: Cigarettes    Start date: 08/13/1973  . Smokeless tobacco: Never Used  Vaping Use  . Vaping Use: Never used  Substance Use Topics  . Alcohol use: Not Currently    Comment: quit drinking in 2017  . Drug use: Yes    Types: Marijuana, Cocaine    Comment: reports using once every 3 months,  Quit 04-06-2019    Home Medications Prior to Admission medications   Medication Sig Start Date End Date Taking? Authorizing Provider  albuterol (VENTOLIN HFA) 108 (90 Base) MCG/ACT inhaler Inhale 2 puffs into the lungs every 4 (four) hours as needed for wheezing or shortness of breath. 07/10/20   Charlott Rakes, MD  amiodarone (PACERONE) 200 MG tablet Take 1 tablet (200 mg total) by mouth daily. 07/22/20   Cantwell, Celeste C, PA-C  cetirizine (ZYRTEC) 10 MG tablet Take 10 mg by mouth daily as needed. 06/23/20   [provider]  ciclopirox (PENLAC) 8 % solution Apply topically  at bedtime. For 8 weeks Patient not taking: No sig reported 07/16/20   Charlott Rakes, MD  diltiazem (CARDIZEM CD) 120 MG 24 hr capsule TAKE 1 CAPSULE BY MOUTH EVERY DAY Patient taking differently: Take 120 mg by mouth daily. 06/11/20   Adrian Prows, MD  diphenhydrAMINE (BENADRYL) 25 mg capsule Take 1 capsule (25 mg total) by mouth every 6 (six) hours as needed for itching or allergies. Patient not taking: Reported on 09/03/2020 01/29/20   Shelly Coss, MD  gabapentin (NEURONTIN) 300 MG capsule Take 1 capsule (300 mg total) by mouth in the morning and at bedtime. 06/26/20   Charlott Rakes, MD  metoprolol succinate (TOPROL XL) 25 MG 24 hr tablet Take 1 tablet (25 mg total) by mouth daily. 07/22/20   Cantwell, Celeste C, PA-C  ondansetron (ZOFRAN) 4 MG tablet TAKE 1 TABLET BY MOUTH EVERY 8 HOURS AS NEEDED FOR NAUSEA AND VOMITING Patient taking differently: Take 4 mg by mouth every 8 (eight) hours as needed for nausea or vomiting. 08/29/20   Charlott Rakes, MD  oxyCODONE (ROXICODONE) 5 MG immediate release tablet 1 tab PO q6 hours prn pain 09/04/20   Leanora Cover, MD  Oxycodone HCl 10 MG TABS Take 10 mg by mouth 3 (three) times daily as needed (pain).  01/24/20   [provider]  pantoprazole (PROTONIX) 40 MG tablet Take 1 tablet (40 mg total) by mouth 2 (two) times daily. 03/25/20   Cantwell, Celeste C, PA-C  sevelamer carbonate (RENVELA) 800 MG tablet Take 2,400 mg by mouth See admin instructions. Take 3 tablets (2400 mg) by mouth up to three times daily with meals    [provider]  tenofovir (VIREAD) 300 MG tablet Take 1 by mouth every Monday. Patient not taking: No sig reported 05/22/20   Delora Fuel, MD    Allergies    Tramadol, Grass extracts [gramineae pollens], Morphine and related, Pollen extract, Acetaminophen, Aspirin, and Clonidine derivatives  Review of Systems   Review of Systems  Constitutional: Negative for chills and fever.  HENT: Negative for ear pain and sore  throat.   Eyes: Negative for pain and visual disturbance.  Respiratory: Negative for cough and shortness of breath.   Cardiovascular: Negative for chest pain and palpitations.  Gastrointestinal: Positive for diarrhea, nausea and vomiting. Negative for abdominal pain.  Genitourinary: Negative for dysuria and hematuria.  Musculoskeletal: Negative for arthralgias and back pain.  Skin: Negative for color change and rash.  Neurological: Negative for seizures, syncope and headaches.  All other systems reviewed and are negative.   Physical Exam Updated Vital Signs BP 95/74   Pulse 96   Resp 16   Ht 5\' 9"  (1.753 m)   Wt 68 kg   SpO2 96%   BMI 22.14 kg/m   Physical Exam Vitals and nursing note reviewed.  Constitutional:      General: He is awake. He is not in acute distress.    Appearance: He is well-developed. He is ill-appearing.  HENT:     Head: Normocephalic and atraumatic.     Right Ear: External ear normal.     Left Ear: External ear normal.     Nose: Nose normal.     Mouth/Throat:     Mouth: Mucous membranes are moist.     Pharynx: Oropharynx is clear. No oropharyngeal exudate or posterior oropharyngeal erythema.  Eyes:     General: Scleral icterus present.        Right eye: No discharge.  Left eye: No discharge.     Extraocular Movements: Extraocular movements intact.     Pupils: Pupils are equal, round, and reactive to light.  Cardiovascular:     Rate and Rhythm: Regular rhythm. Tachycardia present.     Pulses: Normal pulses.          Radial pulses are 2+ on the right side and 2+ on the left side.       Dorsalis pedis pulses are 2+ on the right side and 2+ on the left side.     Heart sounds: Normal heart sounds.  Pulmonary:     Effort: Pulmonary effort is normal. No respiratory distress.     Breath sounds: Normal breath sounds.  Abdominal:     General: Abdomen is flat. There is distension.     Tenderness: There is no abdominal tenderness. There is no  guarding or rebound.  Musculoskeletal:     Right lower leg: No edema.     Left lower leg: No edema.     Comments: AVF to RUE with palpable thrill.  Skin:    General: Skin is warm and dry.     Findings: No rash.  Neurological:     General: No focal deficit present.     Mental Status: He is alert and oriented to person, place, and time.     GCS: GCS eye subscore is 4. GCS verbal subscore is 5. GCS motor subscore is 6.     Sensory: Sensation is intact. No sensory deficit.     Motor: Motor function is intact. No weakness.  Psychiatric:        Mood and Affect: Mood normal.        Behavior: Behavior normal. Behavior is cooperative.     ED Results / Procedures / Treatments   Labs (all labs ordered are listed, but only abnormal results are displayed) Labs Reviewed  CBC WITH DIFFERENTIAL/PLATELET - Abnormal; Notable for the following components:      Result Value   WBC 16.3 (*)    RBC 4.19 (*)    Hemoglobin 12.7 (*)    HCT 35.6 (*)    RDW 16.6 (*)    nRBC 0.7 (*)    Neutro Abs 14.0 (*)    Abs Immature Granulocytes 0.11 (*)    All other components within normal limits  COMPREHENSIVE METABOLIC PANEL - Abnormal; Notable for the following components:   Sodium 133 (*)    Potassium 6.6 (*)    Chloride 88 (*)    CO2 7 (*)    Glucose, Bld 56 (*)    BUN 90 (*)    Creatinine, Ser 14.46 (*)    Total Protein 8.2 (*)    AST 285 (*)    ALT 106 (*)    Alkaline Phosphatase 268 (*)    Total Bilirubin 1.4 (*)    GFR, Estimated 4 (*)    Anion gap 38 (*)    All other components within normal limits  SALICYLATE LEVEL - Abnormal; Notable for the following components:   Salicylate Lvl <2.7 (*)    All other components within normal limits  LACTIC ACID, PLASMA - Abnormal; Notable for the following components:   Lactic Acid, Venous 2.8 (*)    All other components within normal limits  LACTIC ACID, PLASMA - Abnormal; Notable for the following components:   Lactic Acid, Venous 2.6 (*)    All  other components within normal limits  PROTIME-INR - Abnormal; Notable for the following components:  Prothrombin Time 21.5 (*)    INR 1.9 (*)    All other components within normal limits  APTT - Abnormal; Notable for the following components:   aPTT 60 (*)    All other components within normal limits  GLUCOSE, CAPILLARY - Abnormal; Notable for the following components:   Glucose-Capillary <10 (*)    All other components within normal limits  GLUCOSE, CAPILLARY - Abnormal; Notable for the following components:   Glucose-Capillary 148 (*)    All other components within normal limits  CBC WITH DIFFERENTIAL/PLATELET - Abnormal; Notable for the following components:   WBC 14.8 (*)    RBC 3.99 (*)    Hemoglobin 11.9 (*)    HCT 32.8 (*)    MCHC 36.3 (*)    RDW 16.3 (*)    nRBC 1.4 (*)    Neutro Abs 12.6 (*)    Abs Immature Granulocytes 0.12 (*)    All other components within normal limits  COMPREHENSIVE METABOLIC PANEL - Abnormal; Notable for the following components:   Potassium 7.5 (*)    Chloride 86 (*)    CO2 <7 (*)    Glucose, Bld 61 (*)    BUN 94 (*)    Creatinine, Ser 15.01 (*)    Albumin 3.4 (*)    AST 212 (*)    ALT 89 (*)    Alkaline Phosphatase 259 (*)    Total Bilirubin 1.3 (*)    GFR, Estimated 3 (*)    All other components within normal limits  LIPID PANEL - Abnormal; Notable for the following components:   Triglycerides 175 (*)    HDL 26 (*)    All other components within normal limits  MAGNESIUM - Abnormal; Notable for the following components:   Magnesium 2.8 (*)    All other components within normal limits  I-STAT VENOUS BLOOD GAS, ED - Abnormal; Notable for the following components:   pH, Ven 7.045 (*)    pCO2, Ven 28.4 (*)    Bicarbonate 7.8 (*)    TCO2 9 (*)    Acid-base deficit 22.0 (*)    Sodium 132 (*)    Potassium 6.4 (*)    Calcium, Ion 1.07 (*)    All other components within normal limits  RESP PANEL BY RT-PCR (FLU A&B, COVID) [id number]   CULTURE, BLOOD (ROUTINE X 2)  CULTURE, BLOOD (ROUTINE X 2)  MRSA PCR SCREENING  ACETAMINOPHEN LEVEL  HEPATIC FUNCTION PANEL  PROTIME-INR  CBC  BASIC METABOLIC PANEL    EKG EKG Interpretation  Date/Time:  Sunday September 07 2020 16:49:21 EST Ventricular Rate:  109 PR Interval:    QRS Duration: 206 QT Interval:  442 QTC Calculation: 596 R Axis:   178 Text Interpretation: Sinus tachycardia Ventricular premature complex Consider left ventricular hypertrophy Anterior Q waves, possibly due to LVH Prolonged QT interval Since last tracing Rate faster Confirmed by Noemi Chapel [phone]) on 09/07/2020 4:56:07 PM   Radiology DG Chest Portable 1 View  Result Date: 09/07/2020 CLINICAL DATA:  Overdose. EXAM: PORTABLE CHEST 1 VIEW COMPARISON:  June 30, 2020. FINDINGS: Stable cardiomegaly. No pneumothorax or pleural effusion is noted. Both lungs are clear. The visualized skeletal structures are unremarkable. IMPRESSION: No active disease. Electronically Signed   By: Marijo Conception M.D.   On: 09/07/2020 17:38    Procedures Procedures  Medications Ordered in ED Medications  acetylcysteine (ACETADOTE) 40 mg/mL load via infusion 10,200 mg (10,200 mg Intravenous Bolus from Bag 09/07/20 1916)  Followed by  acetylcysteine (ACETADOTE) 24,000 mg in dextrose 5 % 600 mL (40 mg/mL) infusion (15 mg/kg/hr  68 kg Intravenous New Bag/Given 09/07/20 1917)  sodium chloride 0.9 % bolus 500 mL (500 mLs Intravenous Not Given 09/07/20 1902)  diltiazem (CARDIZEM CD) 24 hr capsule 120 mg (has no administration in time range)  metoprolol succinate (TOPROL-XL) 24 hr tablet 25 mg (has no administration in time range)  sevelamer carbonate (RENVELA) tablet 3,200 mg (has no administration in time range)  ceFEPIme (MAXIPIME) 1 g in sodium chloride 0.9 % 100 mL IVPB (1 g Intravenous New Bag/Given 09/08/20 0051)  vancomycin (VANCOREADY) IVPB 1250 mg/250 mL (has no administration in time range)    Followed by   vancomycin (VANCOCIN) IVPB 750 mg/150 ml premix (has no administration in time range)  Chlorhexidine Gluconate Cloth 2 % PADS 6 each (has no administration in time range)  insulin aspart (novoLOG) injection 10 Units (has no administration in time range)  norepinephrine (LEVOPHED) 4mg  in 244mL premix infusion (2 mcg/min Intravenous New Bag/Given 09/08/20 0004)  calcium gluconate 2 g/ 100 mL sodium chloride IVPB (2,000 mg Intravenous New Bag/Given 09/07/20 2344)  sodium bicarbonate injection 50 mEq (50 mEq Intravenous Given 09/07/20 2349)  dextrose 50 % solution 50 mL (50 mLs Intravenous Given 09/07/20 2344)  dextrose 50 % solution (  Duplicate 9/32/67 1245)    ED Course  I have reviewed the triage vital signs and the nursing notes.  Pertinent labs & imaging results that were available during my care of the patient were reviewed by me and considered in my medical decision making (see chart for details).    MDM Rules/Calculators/A&P                         Patient is a 24yoM w/ history and physical as described above who presents to the ED for acetaminophen overdose. On initial exam, unable to attain BP from BUE due to AVF to RUE and previous surgery to LUE. Palpable thrill over fistula and intact palpable radial pulses BLT. Only able to ascertain BP from leg. DP pulses intact and extremities warm and well perfused. Patient mentating appropriately and appears to be perfusing well. Subsequent BP 110s/60s without intervention; suspect initial hypotensive BP in error. VS otherwise notable for tachycardia to 110s and satting well on RA. Mentating appropriately and no focal neuro deficits. Will initiate broad toxic ingestion workup. Promptly discussed patient with [facility] who recommends 150mg /kg NAC bolus over 1hr followed by 15mg /kg/hr infusion for 23 hours. Repeat CMP and INR at 22hrs into infusion. If INR >3, need to give vit K (10mg  PO) and consider fomepizole if very high.  EG notable for sinus  tachycardia with prolonged QTc. Acetaminophen level 18. INR 1.9. Lactic acid 2.6. Salicylate level negative. K 6.6. Bicarb 7. Cr 14.46. AG 38. Diagnostic workup concerning for developing hepatic failure in setting of massive acetaminophen overdose. Discussed patient with Nephrology who will evaluate patient for urgent dialysis given acidosis and hyperkalemia. Discussed patient with hospitalist who will admit for further treatment and evaluation. No further acute events under my care. Patient in stable condition at time of admission.  Final Clinical Impression(s) / ED Diagnoses Final diagnoses:  Accidental acetaminophen overdose, initial encounter    Rx / DC Orders ED Discharge Orders    None       Christy Gentles, MD 09/08/20 8099    Noemi Chapel, MD 09/09/20 1038

## 2020-09-07 NOTE — ED Notes (Signed)
The pt came to the - treatment room needing to have a bm bedside commode placed at the bedside pt argumentative  About everything regarding his treatment

## 2020-09-07 NOTE — ED Notes (Signed)
Report  Given  To to rn  On 5c

## 2020-09-07 NOTE — ED Notes (Signed)
No iv acces waiting for the iv team  No meds can be given  Until a line is established

## 2020-09-07 NOTE — Progress Notes (Signed)
Pharmacy Antibiotic Note  [name] is a 60 y.o. male admitted on 09/07/2020 with sepsis.  Pharmacy has been consulted for Cefepime and Vancomycin dosing.   Height: 5\' 9"  (175.3 cm) Weight: 68 kg (149 lb 14.6 oz) IBW/kg (Calculated) : 70.7  No data recorded.  Recent Labs  Lab 09/04/20 1245 09/07/20 1744 09/07/20 1850  WBC  --  16.3*  --   CREATININE 9.40* 14.46*  --   LATICACIDVEN  --  2.8* 2.6*    Estimated Creatinine Clearance: 5.4 mL/min (A) (by C-G formula based on SCr of 14.46 mg/dL (H)).    Allergies  Allergen Reactions  . Tramadol Itching and Other (See Comments)    Other reaction(s): Unknown  . Grass Extracts [Gramineae Pollens]     Other reaction(s): Sneezing  . Morphine And Related Other (See Comments)    Stomach pain  . Pollen Extract Other (See Comments)    Other reaction(s): Sneezing (finding)  . Acetaminophen Nausea Only    Stomach ache   . Aspirin Itching and Other (See Comments)    STOMACH PAIN  Other reaction(s): Unknown  . Clonidine Derivatives Itching    Antimicrobials this admission: 2/27 Cefepime >>  2/27 Vancomycin >>   Dose adjustments this admission: N/a  Microbiology results: Pending   Plan:  - Cefepime 1g IV q24h - Vancomycin 1250mg  IV x 1 dose  - Followed by Vancomycin 750mg  IV qMWF after HD - Monitor patients renal function and urine output  - De-escalate ABX when appropriate   Thank you for allowing pharmacy to be a part of this patient's care.  Duanne Limerick PharmD. BCPS 09/07/2020 9:18 PM

## 2020-09-07 NOTE — Consult Note (Addendum)
Renal Service Consult Note [facility]  [facility] 09/07/2020 [name], MD Requesting Physician: Dr. Benson Setting  Reason for Consult: ESRD pt w/ tylenol overdose and metabolic derangements HPI: The patient is a 60 y.o. year-old w/ hx of chronic hep B, ESRD on HD, cirrhosis, recurrent paracentesis for ascites, chron atrial fib (not a/c candidate due to noncompliance), COPD, hx CAD w/ stent 2018. Patient had partial amputation of two fingers of the R hand 3 days ago on Thursday 09/04/20. He missed HD Friday (did go Wed). He came to ED saying he ran out of his post-op pain medications and had taken "48,000 mg of tylenol". He c/o N/v and diarrhea. In ED labs showed K+ 6.6, CO2 7 , BUN 90 and creat 16.6.  Alb 3.7, AST 285, ALT 106, Tbili 1.4.  WBC 16k Hb 12 , plts ok.  CXR showed no active disease. Pt to be admitted. Asked to see for dialysis.   Pt is in a bad mood and gave minimal history.  Denies any active SOB or chest pain. No abd pain. +diarrhea issues. No fevers or chills. Ox 3   ROS  denies CP  no joint pain   no HA  no blurry vision  no rash   Past Medical History  Past Medical History:  Diagnosis Date  . Anemia   . Anxiety   . Arthritis    left shoulder  . Atherosclerosis of aorta ([place])   . Cardiomegaly   . Chest pain    DATE UNKNOWN, C/O PERIODICALLY  . Cocaine abuse ([place])   . COPD exacerbation ([place]) 08/17/2016  . Coronary artery disease    stent 02/22/17  . Dysrhythmia   . ESRD (end stage renal disease) on dialysis ([place])    "[name]; MWF" (07/04/2017)  . GERD (gastroesophageal reflux disease)    DATE UNKNOWN  . Hemorrhoids   . Hepatitis B, chronic ([place])   . Hepatitis C   . History of kidney stones   . Hyperkalemia   . Hypertension   . Kidney failure   . Metabolic bone disease    Patient denies  . Mitral stenosis   . Myocardial infarction ([place])   . Pneumonia   . Pulmonary edema   . Solitary rectal ulcer syndrome  07/2017   at flex sig for rectal bleeding  . Tubular adenoma of colon    Past Surgical History  Past Surgical History:  Procedure Laterality Date  . A/V FISTULAGRAM Left 05/26/2017   Procedure: A/V FISTULAGRAM;  Surgeon: Conrad Buna, MD;  Location: [place] [facility];  Service: Cardiovascular;  Laterality: Left;  . A/V FISTULAGRAM Right 11/18/2017   Procedure: A/V FISTULAGRAM - Right Arm;  Surgeon: Elam Dutch, MD;  Location: [place] [facility];  Service: Cardiovascular;  Laterality: Right;  . AMPUTATION Right 09/04/2020   Procedure: RIGHT INDEX AND RIGHT RING FINGER AMPUTATION DIGIT;  Surgeon: Leanora Cover, MD;  Location: [place];  Service: Orthopedics;  Laterality: Right;  . APPLICATION OF WOUND VAC Left 06/14/2017   Procedure: APPLICATION OF WOUND VAC;  Surgeon: Katha Cabal, MD;  Location: [facility];  Service: Vascular;  Laterality: Left;  . AV FISTULA PLACEMENT  2012   BELIEVED WAS PLACED IN JUNE  . AV FISTULA PLACEMENT Right 08/09/2017   Procedure: Creation Right arm ARTERIOVENOUS BRACHIOCEPOHALIC FISTULA;  Surgeon: Elam Dutch, MD;  Location: [place];  Service: Vascular;  Laterality: Right;  . AV FISTULA PLACEMENT Right 11/22/2017  Procedure: INSERTION OF ARTERIOVENOUS (AV) GORE-TEX GRAFT RIGHT UPPER ARM;  Surgeon: Elam Dutch, MD;  Location: [place];  Service: Vascular;  Laterality: Right;  . BIOPSY  01/25/2018   Procedure: BIOPSY;  Surgeon: Jerene Bears, MD;  Location: [place];  Service: Gastroenterology;;  . BIOPSY  04/10/2019   Procedure: BIOPSY;  Surgeon: Jerene Bears, MD;  Location: [facility];  Service: Gastroenterology;;  . COLONOSCOPY    . COLONOSCOPY WITH PROPOFOL N/A 01/25/2018   Procedure: COLONOSCOPY WITH PROPOFOL;  Surgeon: Jerene Bears, MD;  Location: [place];  Service: Gastroenterology;  Laterality: N/A;  . CORONARY STENT INTERVENTION N/A 02/22/2017   Procedure: CORONARY STENT INTERVENTION;  Surgeon: Nigel Mormon, MD;  Location: [place] [facility];  Service: Cardiovascular;  Laterality: N/A;  . ESOPHAGOGASTRODUODENOSCOPY (EGD) WITH PROPOFOL N/A 01/25/2018   Procedure: ESOPHAGOGASTRODUODENOSCOPY (EGD) WITH PROPOFOL;  Surgeon: Jerene Bears, MD;  Location: [place];  Service: Gastroenterology;  Laterality: N/A;  . ESOPHAGOGASTRODUODENOSCOPY (EGD) WITH PROPOFOL N/A 04/10/2019   Procedure: ESOPHAGOGASTRODUODENOSCOPY (EGD) WITH PROPOFOL;  Surgeon: Jerene Bears, MD;  Location: [facility];  Service: Gastroenterology;  Laterality: N/A;  . FLEXIBLE SIGMOIDOSCOPY N/A 07/15/2017   Procedure: FLEXIBLE SIGMOIDOSCOPY;  Surgeon: Carol Ada, MD;  Location: [place];  Service: Endoscopy;  Laterality: N/A;  . HEMORRHOID BANDING    . I & D EXTREMITY Left 06/01/2017   Procedure: IRRIGATION AND DEBRIDEMENT LEFT ARM HEMATOMA WITH LIGATION OF LEFT ARM AV FISTULA;  Surgeon: Elam Dutch, MD;  Location: [place];  Service: Vascular;  Laterality: Left;  . I & D EXTREMITY Left 06/14/2017   Procedure: IRRIGATION AND DEBRIDEMENT EXTREMITY;  Surgeon: Katha Cabal, MD;  Location: [facility];  Service: Vascular;  Laterality: Left;  . INSERTION OF DIALYSIS CATHETER  05/30/2017  . INSERTION OF DIALYSIS CATHETER N/A 05/30/2017   Procedure: INSERTION OF DIALYSIS CATHETER;  Surgeon: Elam Dutch, MD;  Location: [place];  Service: Vascular;  Laterality: N/A;  . IR PARACENTESIS  08/30/2017  . IR PARACENTESIS  09/29/2017  . IR PARACENTESIS  10/28/2017  . IR PARACENTESIS  11/09/2017  . IR PARACENTESIS  11/16/2017  . IR PARACENTESIS  11/28/2017  . IR PARACENTESIS  12/01/2017  . IR PARACENTESIS  12/06/2017  . IR PARACENTESIS  01/03/2018  . IR PARACENTESIS  01/23/2018  . IR PARACENTESIS  02/07/2018  . IR PARACENTESIS  02/21/2018  . IR PARACENTESIS  03/06/2018  . IR PARACENTESIS  03/17/2018  . IR PARACENTESIS  04/04/2018  . IR PARACENTESIS  12/28/2018  . IR PARACENTESIS  01/08/2019  . IR PARACENTESIS  01/23/2019  . IR PARACENTESIS  02/01/2019  . IR  PARACENTESIS  02/19/2019  . IR PARACENTESIS  03/01/2019  . IR PARACENTESIS  03/15/2019  . IR PARACENTESIS  04/03/2019  . IR PARACENTESIS  04/12/2019  . IR PARACENTESIS  05/01/2019  . IR PARACENTESIS  05/08/2019  . IR PARACENTESIS  05/24/2019  . IR PARACENTESIS  06/12/2019  . IR PARACENTESIS  07/09/2019  . IR PARACENTESIS  07/27/2019  . IR PARACENTESIS  08/09/2019  . IR PARACENTESIS  08/21/2019  . IR PARACENTESIS  09/17/2019  . IR PARACENTESIS  10/05/2019  . IR PARACENTESIS  10/29/2019  . IR PARACENTESIS  11/08/2019  . IR PARACENTESIS  12/12/2019  . IR PARACENTESIS  01/03/2020  . IR PARACENTESIS  01/10/2020  . IR PARACENTESIS  01/17/2020  . IR PARACENTESIS  01/24/2020  . IR PARACENTESIS  01/31/2020  . IR PARACENTESIS  02/07/2020  .  IR PARACENTESIS  02/21/2020  . IR PARACENTESIS  05/21/2020  . IR RADIOLOGIST EVAL & MGMT  02/14/2018  . IR RADIOLOGIST EVAL & MGMT  02/22/2019  . LEFT HEART CATH AND CORONARY ANGIOGRAPHY N/A 02/22/2017   Procedure: LEFT HEART CATH AND CORONARY ANGIOGRAPHY;  Surgeon: Nigel Mormon, MD;  Location: [place] [facility];  Service: Cardiovascular;  Laterality: N/A;  . LIGATION OF ARTERIOVENOUS  FISTULA Left 02/10/4234   Procedure: Plication of Left Arm Arteriovenous Fistula;  Surgeon: Elam Dutch, MD;  Location: [place];  Service: Vascular;  Laterality: Left;  . POLYPECTOMY    . POLYPECTOMY  01/25/2018   Procedure: POLYPECTOMY;  Surgeon: Jerene Bears, MD;  Location: [place];  Service: Gastroenterology;;  . REVISON OF ARTERIOVENOUS FISTULA Left 3/61/4431   Procedure: PLICATION OF DISTAL ANEURYSMAL SEGEMENT OF LEFT UPPER ARM ARTERIOVENOUS FISTULA;  Surgeon: Elam Dutch, MD;  Location: [place];  Service: Vascular;  Laterality: Left;  . REVISON OF ARTERIOVENOUS FISTULA Left 5/40/0867   Procedure: Plication of Left Upper Arm Fistula ;  Surgeon: Waynetta Sandy, MD;  Location: [place];  Service: Vascular;  Laterality: Left;  . SKIN GRAFT SPLIT THICKNESS LEG / FOOT Left     SKIN GRAFT SPLIT THICKNESS LEFT ARM DONOR SITE: LEFT ANTERIOR THIGH  . SKIN SPLIT GRAFT Left 07/04/2017   Procedure: SKIN GRAFT SPLIT THICKNESS LEFT ARM DONOR SITE: LEFT ANTERIOR THIGH;  Surgeon: Elam Dutch, MD;  Location: [place];  Service: Vascular;  Laterality: Left;  . THROMBECTOMY W/ EMBOLECTOMY Left 06/05/2017   Procedure: EXPLORATION OF LEFT ARM FOR BLEEDING; OVERSEWED PROXIMAL FISTULA;  Surgeon: Angelia Mould, MD;  Location: [place];  Service: Vascular;  Laterality: Left;  . WOUND EXPLORATION Left 06/03/2017   Procedure: WOUND EXPLORATION WITH WOUND VAC APPLICATION TO LEFT ARM;  Surgeon: Angelia Mould, MD;  Location: [facility];  Service: Vascular;  Laterality: Left;   Family History  Family History  Problem Relation Age of Onset  . Heart disease Mother   . Lung cancer Mother   . Heart disease Father   . Malignant hyperthermia Father   . COPD Father   . Throat cancer Sister   . Esophageal cancer Sister   . Hypertension Other   . COPD Other   . Throat cancer Sister   . Colon cancer Neg Hx   . Colon polyps Neg Hx   . Rectal cancer Neg Hx   . Stomach cancer Neg Hx    Social History  reports that he has been smoking cigarettes. He started smoking about 47 years ago. He has a 43.00 pack-year smoking history. He has never used smokeless tobacco. He reports previous alcohol use. He reports current drug use. Drugs: Marijuana and Cocaine. Allergies  Allergies  Allergen Reactions  . Tramadol Itching and Other (See Comments)    Other reaction(s): Unknown  . Grass Extracts [Gramineae Pollens]     Other reaction(s): Sneezing  . Morphine And Related Other (See Comments)    Stomach pain  . Pollen Extract Other (See Comments)    Other reaction(s): Sneezing (finding)  . Acetaminophen Nausea Only    Stomach ache   . Aspirin Itching and Other (See Comments)    STOMACH PAIN  Other reaction(s): Unknown  . Clonidine Derivatives Itching   Home medications Prior to  Admission medications   Medication Sig Start Date End Date Taking? Authorizing Provider  albuterol (VENTOLIN HFA) 108 (90 Base) MCG/ACT inhaler Inhale 2 puffs into the  lungs every 4 (four) hours as needed for wheezing or shortness of breath. 07/10/20   Charlott Rakes, MD  amiodarone (PACERONE) 200 MG tablet Take 1 tablet (200 mg total) by mouth daily. 07/22/20   Cantwell, Celeste C, PA-C  cetirizine (ZYRTEC) 10 MG tablet Take 10 mg by mouth daily as needed. 06/23/20   [provider]  ciclopirox (PENLAC) 8 % solution Apply topically at bedtime. For 8 weeks Patient not taking: No sig reported 07/16/20   Charlott Rakes, MD  diltiazem (CARDIZEM CD) 120 MG 24 hr capsule TAKE 1 CAPSULE BY MOUTH EVERY DAY Patient taking differently: Take 120 mg by mouth daily. 06/11/20   Adrian Prows, MD  diphenhydrAMINE (BENADRYL) 25 mg capsule Take 1 capsule (25 mg total) by mouth every 6 (six) hours as needed for itching or allergies. Patient not taking: Reported on 09/03/2020 01/29/20   Shelly Coss, MD  gabapentin (NEURONTIN) 300 MG capsule Take 1 capsule (300 mg total) by mouth in the morning and at bedtime. 06/26/20   Charlott Rakes, MD  metoprolol succinate (TOPROL XL) 25 MG 24 hr tablet Take 1 tablet (25 mg total) by mouth daily. 07/22/20   Cantwell, Celeste C, PA-C  ondansetron (ZOFRAN) 4 MG tablet TAKE 1 TABLET BY MOUTH EVERY 8 HOURS AS NEEDED FOR NAUSEA AND VOMITING Patient taking differently: Take 4 mg by mouth every 8 (eight) hours as needed for nausea or vomiting. 08/29/20   Charlott Rakes, MD  oxyCODONE (ROXICODONE) 5 MG immediate release tablet 1 tab PO q6 hours prn pain 09/04/20   Leanora Cover, MD  Oxycodone HCl 10 MG TABS Take 10 mg by mouth 3 (three) times daily as needed (pain).  01/24/20   [provider]  pantoprazole (PROTONIX) 40 MG tablet Take 1 tablet (40 mg total) by mouth 2 (two) times daily. 03/25/20   Cantwell, Celeste C, PA-C  sevelamer carbonate (RENVELA) 800 MG tablet Take  2,400 mg by mouth See admin instructions. Take 3 tablets (2400 mg) by mouth up to three times daily with meals    [provider]  tenofovir (VIREAD) 300 MG tablet Take 1 by mouth every Monday. Patient not taking: No sig reported 05/22/20   Delora Fuel, MD     Vitals:   09/07/20 1800 09/07/20 1806 09/07/20 1845 09/07/20 1915  BP:  (!) 75/65 104/73 111/69  Pulse:  98 (!) 111 (!) 115  Resp:  10 15 17   SpO2: 90% 96% 98% 98%  Weight:      Height:       Exam Gen sleepy but easily awakens and answers questions appropriately  No rash, cyanosis or gangrene Sclera anicteric, throat clear, mouth dry  No jvd or bruits Chest clear bilat to bases, no rales or wheezing RRR no MRG Abd soft ntnd no mass or ascites +bs GU normal male MS no joint effusions or deformity Ext no LE or UE edema, 2 fingers on R hand w/ dressings not removed, no odor or dressing stains Neuro is alert, Ox 3 , nf RUA AVF - bruit is distant but is +    Home meds:  - cardizem CD 120/ amio 200 qd/ toprol xl 25 qd  - neurontin 300 qam and qhs/ oxy IR 5- 10 mg qid prn  - protonix 40 bid/ renvela 3 ac tid  - viread 300 qd  - prn's/ vitamins/ supplements      OP HD: [place] MWF   4h  450/800  61kg  2/2 bath  Hep none  RUA AVF  - meds pending  Assessment/Plan: 1. Hyperkalemia/ metabolic acidosis/ mild uremia- K+ 6.6, serum CO2 low at 7.  Acidosis combination of liver inflammation and missed HD. Plan HD in room tonight w/ goal of partial or complete correction of these issues.  2. Tylenol overdose - questionable that he took the reported amount per the ED MD, given tylenol levels were is therapeutic range. Getting IV NAC.  3. Afib RVR- takes amio, diltiazem and metoprolol. Not a/c candidate due to compliance issues.  4. Cirrhosis/ chronic hepatitis B - gets periodic paracentesis  5. ESRD - usual HD MWF. Missed HD Friday, last OP HD was Wed. Plan HD tonight as above.  6. Anemia - HGB 12 here. Get esa records  in am. Follow.  7. Secondary hyperparathyroidism - get records in am.  8. HTN/volume - no wt's available, doesn't appear sig vol overloaded, dry mouth, min LE edema, min ascites compared to usual.  9. Nutrition - Chronically low albumin. Renal diet with protein supps.renal vits.  [name]  MD 09/07/2020, 9:05 PM  Recent Labs  Lab 09/07/20 1731 09/07/20 1744  WBC  --  16.3*  HGB 14.6 12.7*   Recent Labs  Lab 09/04/20 1245 09/07/20 1731 09/07/20 1744  K 5.3* 6.4* 6.6*  BUN 55*  --  90*  CREATININE 9.40*  --  14.46*  CALCIUM  --   --  9.5

## 2020-09-07 NOTE — ED Provider Notes (Signed)
I saw and evaluated the patient, reviewed the resident's note and I agree with the findings and plan.  Pertinent History: Patient is a 60 year old male, he is on dialysis, accesses in the right upper extremity.  He was recently operated on for some necrotic fingers and had partial amputations of the right hand.  Given oxycodone, ran out of pain medications and over the last 2 days is taken over 48,000 mg of Tylenol, over-the-counter medications.  Today has had nausea vomiting and diarrhea.    Pertinent Exam findings: On exam this patient has a distended abdomen with symptomatic sounds to percussion.  He has no significant edema, good thrill in the fistula.  He has a slight jaundice to his eyes which is possible.  Cardiac and pulmonary exams are rather unremarkable.  The patient appears to have a dry mucous membranes and has not had dialysis since Wednesday  I was personally present and directly supervised the following procedures:  Medical stabilizing care Resuscitation for overdose of acetaminophen Consultation with poison control center N acetylcysteine Admission to the hospital  This patient is critically ill  .Critical Care Performed by: Noemi Chapel, MD Authorized by: Noemi Chapel, MD   Critical care provider statement:    Critical care time (minutes):  35   Critical care time was exclusive of:  Separately billable procedures and treating other patients and teaching time   Critical care was necessary to treat or prevent imminent or life-threatening deterioration of the following conditions:  Toxidrome   Critical care was time spent personally by me on the following activities:  Blood draw for specimens, development of treatment plan with patient or surrogate, discussions with consultants, evaluation of patient's response to treatment, examination of patient, obtaining history from patient or surrogate, ordering and performing treatments and interventions, ordering and review of  laboratory studies, ordering and review of radiographic studies, pulse oximetry, re-evaluation of patient's condition and review of old charts Comments:         [place] (  )- reccomendations - IV NAC, loading dose of 150mg  / kg bolus over 1 hour, then followed by a maintenance dose of 15 mg / kg / hour for 23 hours.  Repeat tylenol level and CMP and INR at 22 hours into the infusion.  If INR is > 3, vitamin K (10mg  oral), fomepizole - if levels are very high - they want [place] to call them back.    No formal recommendations on dialysis at this time.  I personally interpreted the EKG as well as the resident and agree with the interpretation on the resident's chart.   EKG Interpretation  Date/Time:  Sunday September 07 2020 16:49:21 EST Ventricular Rate:  109 PR Interval:    QRS Duration: 206 QT Interval:  442 QTC Calculation: 596 R Axis:   178 Text Interpretation: Sinus tachycardia Ventricular premature complex Consider left ventricular hypertrophy Anterior Q waves, possibly due to LVH Prolonged QT interval Since last tracing Rate faster Confirmed by Noemi Chapel [phone]) on 09/07/2020 4:56:07 PM       Final diagnoses:  Accidental acetaminophen overdose, initial encounter  Acidosis Hyperkalemia    Noemi Chapel, MD 09/09/20 1037

## 2020-09-08 LAB — HEPATIC FUNCTION PANEL
ALT: 82 U/L — ABNORMAL HIGH (ref 0–44)
ALT: 58 U/L — ABNORMAL HIGH (ref 0–44)
AST: 197 U/L — ABNORMAL HIGH (ref 15–41)
AST: 177 U/L — ABNORMAL HIGH (ref 15–41)
Albumin: 3.2 g/dL — ABNORMAL LOW (ref 3.5–5.0)
Albumin: 3.3 g/dL — ABNORMAL LOW (ref 3.5–5.0)
Alkaline Phosphatase: 244 U/L — ABNORMAL HIGH (ref 38–126)
Alkaline Phosphatase: 219 U/L — ABNORMAL HIGH (ref 38–126)
Bilirubin, Direct: 0.2 mg/dL (ref 0.0–0.2)
Bilirubin, Direct: 0.4 mg/dL — ABNORMAL HIGH (ref 0.0–0.2)
Indirect Bilirubin: 0.8 mg/dL (ref 0.3–0.9)
Indirect Bilirubin: 1 mg/dL — ABNORMAL HIGH (ref 0.3–0.9)
Total Bilirubin: 1 mg/dL (ref 0.3–1.2)
Total Bilirubin: 1.4 mg/dL — ABNORMAL HIGH (ref 0.3–1.2)
Total Protein: 7.4 g/dL (ref 6.5–8.1)
Total Protein: 7.3 g/dL (ref 6.5–8.1)

## 2020-09-08 LAB — AMMONIA: Ammonia: 41 umol/L — ABNORMAL HIGH (ref 9–35)

## 2020-09-08 LAB — GLUCOSE, CAPILLARY
Glucose-Capillary: 110 mg/dL — ABNORMAL HIGH (ref 70–99)
Glucose-Capillary: 125 mg/dL — ABNORMAL HIGH (ref 70–99)
Glucose-Capillary: 136 mg/dL — ABNORMAL HIGH (ref 70–99)
Glucose-Capillary: 141 mg/dL — ABNORMAL HIGH (ref 70–99)
Glucose-Capillary: 145 mg/dL — ABNORMAL HIGH (ref 70–99)
Glucose-Capillary: 57 mg/dL — ABNORMAL LOW (ref 70–99)
Glucose-Capillary: 59 mg/dL — ABNORMAL LOW (ref 70–99)
Glucose-Capillary: 63 mg/dL — ABNORMAL LOW (ref 70–99)
Glucose-Capillary: 68 mg/dL — ABNORMAL LOW (ref 70–99)
Glucose-Capillary: 73 mg/dL (ref 70–99)
Glucose-Capillary: 79 mg/dL (ref 70–99)
Glucose-Capillary: 88 mg/dL (ref 70–99)

## 2020-09-08 LAB — COMPREHENSIVE METABOLIC PANEL
ALT: 63 U/L — ABNORMAL HIGH (ref 0–44)
ALT: 89 U/L — ABNORMAL HIGH (ref 0–44)
AST: 167 U/L — ABNORMAL HIGH (ref 15–41)
AST: 212 U/L — ABNORMAL HIGH (ref 15–41)
Albumin: 3.3 g/dL — ABNORMAL LOW (ref 3.5–5.0)
Albumin: 3.4 g/dL — ABNORMAL LOW (ref 3.5–5.0)
Alkaline Phosphatase: 203 U/L — ABNORMAL HIGH (ref 38–126)
Alkaline Phosphatase: 259 U/L — ABNORMAL HIGH (ref 38–126)
Anion gap: 29 — ABNORMAL HIGH (ref 5–15)
BUN: 29 mg/dL — ABNORMAL HIGH (ref 6–20)
BUN: 94 mg/dL — ABNORMAL HIGH (ref 6–20)
CO2: 15 mmol/L — ABNORMAL LOW (ref 22–32)
CO2: <7 mmol/L — ABNORMAL LOW (ref 22–32)
Calcium: 9.3 mg/dL (ref 8.9–10.3)
Calcium: 9.3 mg/dL (ref 8.9–10.3)
Chloride: 86 mmol/L — ABNORMAL LOW (ref 98–111)
Chloride: 93 mmol/L — ABNORMAL LOW (ref 98–111)
Creatinine, Ser: 15.01 mg/dL — ABNORMAL HIGH (ref 0.61–1.24)
Creatinine, Ser: 6.72 mg/dL — ABNORMAL HIGH (ref 0.61–1.24)
GFR, Estimated: 3 mL/min — ABNORMAL LOW (ref >60)
GFR, Estimated: 9 mL/min — ABNORMAL LOW (ref >60)
Glucose, Bld: 118 mg/dL — ABNORMAL HIGH (ref 70–99)
Glucose, Bld: 61 mg/dL — ABNORMAL LOW (ref 70–99)
Potassium: 3.9 mmol/L (ref 3.5–5.1)
Potassium: 7.5 mmol/L (ref 3.5–5.1)
Sodium: 135 mmol/L (ref 135–145)
Sodium: 137 mmol/L (ref 135–145)
Total Bilirubin: 1.3 mg/dL — ABNORMAL HIGH (ref 0.3–1.2)
Total Bilirubin: 1.4 mg/dL — ABNORMAL HIGH (ref 0.3–1.2)
Total Protein: 7.1 g/dL (ref 6.5–8.1)
Total Protein: 7.7 g/dL (ref 6.5–8.1)

## 2020-09-08 LAB — PROTIME-INR
INR: 2.5 — ABNORMAL HIGH (ref 0.8–1.2)
INR: 3.2 — ABNORMAL HIGH (ref 0.8–1.2)
Prothrombin Time: 26.2 seconds — ABNORMAL HIGH (ref 11.4–15.2)
Prothrombin Time: 31.7 seconds — ABNORMAL HIGH (ref 11.4–15.2)

## 2020-09-08 LAB — CBC
HCT: 31.4 % — ABNORMAL LOW (ref 39.0–52.0)
Hemoglobin: 11.6 g/dL — ABNORMAL LOW (ref 13.0–17.0)
MCH: 30.4 pg (ref 26.0–34.0)
MCHC: 36.9 g/dL — ABNORMAL HIGH (ref 30.0–36.0)
MCV: 82.4 fL (ref 80.0–100.0)
Platelets: 301 10*3/uL (ref 150–400)
RBC: 3.81 MIL/uL — ABNORMAL LOW (ref 4.22–5.81)
RDW: 16.3 % — ABNORMAL HIGH (ref 11.5–15.5)
WBC: 15 10*3/uL — ABNORMAL HIGH (ref 4.0–10.5)
nRBC: 1.3 % — ABNORMAL HIGH (ref 0.0–0.2)

## 2020-09-08 LAB — CBC WITH DIFFERENTIAL/PLATELET
Abs Immature Granulocytes: 0.12 10*3/uL — ABNORMAL HIGH (ref 0.00–0.07)
Basophils Absolute: 0.1 10*3/uL (ref 0.0–0.1)
Basophils Relative: 1 %
Eosinophils Absolute: 0 10*3/uL (ref 0.0–0.5)
Eosinophils Relative: 0 %
HCT: 32.8 % — ABNORMAL LOW (ref 39.0–52.0)
Hemoglobin: 11.9 g/dL — ABNORMAL LOW (ref 13.0–17.0)
Immature Granulocytes: 1 %
Lymphocytes Relative: 8 %
Lymphs Abs: 1.2 10*3/uL (ref 0.7–4.0)
MCH: 29.8 pg (ref 26.0–34.0)
MCHC: 36.3 g/dL — ABNORMAL HIGH (ref 30.0–36.0)
MCV: 82.2 fL (ref 80.0–100.0)
Monocytes Absolute: 0.7 10*3/uL (ref 0.1–1.0)
Monocytes Relative: 5 %
Neutro Abs: 12.6 10*3/uL — ABNORMAL HIGH (ref 1.7–7.7)
Neutrophils Relative %: 85 %
Platelets: 317 10*3/uL (ref 150–400)
RBC: 3.99 MIL/uL — ABNORMAL LOW (ref 4.22–5.81)
RDW: 16.3 % — ABNORMAL HIGH (ref 11.5–15.5)
WBC: 14.8 10*3/uL — ABNORMAL HIGH (ref 4.0–10.5)
nRBC: 1.4 % — ABNORMAL HIGH (ref 0.0–0.2)

## 2020-09-08 LAB — BASIC METABOLIC PANEL
BUN: 91 mg/dL — ABNORMAL HIGH (ref 6–20)
CO2: <7 mmol/L — ABNORMAL LOW (ref 22–32)
Calcium: 9.8 mg/dL (ref 8.9–10.3)
Chloride: 87 mmol/L — ABNORMAL LOW (ref 98–111)
Creatinine, Ser: 14.76 mg/dL — ABNORMAL HIGH (ref 0.61–1.24)
GFR, Estimated: 3 mL/min — ABNORMAL LOW (ref >60)
Glucose, Bld: 91 mg/dL (ref 70–99)
Potassium: 6.8 mmol/L (ref 3.5–5.1)
Sodium: 137 mmol/L (ref 135–145)

## 2020-09-08 LAB — LIPID PANEL
Cholesterol: 102 mg/dL (ref 0–200)
HDL: 26 mg/dL — ABNORMAL LOW (ref >40)
LDL Cholesterol: 41 mg/dL (ref 0–99)
Total CHOL/HDL Ratio: 3.9 RATIO
Triglycerides: 175 mg/dL — ABNORMAL HIGH (ref <150)
VLDL: 35 mg/dL (ref 0–40)

## 2020-09-08 LAB — TYPE AND SCREEN
ABO/RH(D): O NEG
Antibody Screen: NEGATIVE

## 2020-09-08 LAB — ACETAMINOPHEN LEVEL: Acetaminophen (Tylenol), Serum: <10 ug/mL — ABNORMAL LOW (ref 10–30)

## 2020-09-08 LAB — MAGNESIUM: Magnesium: 2.8 mg/dL — ABNORMAL HIGH (ref 1.7–2.4)

## 2020-09-08 LAB — MRSA PCR SCREENING: MRSA by PCR: NEGATIVE

## 2020-09-08 LAB — SURGICAL PATHOLOGY

## 2020-09-08 IMAGING — US PARACENTESIS WITH ULTRASOUND GUIDANCE
1 series · 5 of 5 positions shown · non-contrast
Comparison: none

INDICATION: Abdominal distention secondary to ascites. Underlying history of
alcoholic cirrhosis. Request for diagnostic and therapeutic
paracentesis.

[Series 1: paracentesis with ultrasound guidance · 5 of 5 slices shown]
[im 1/5]
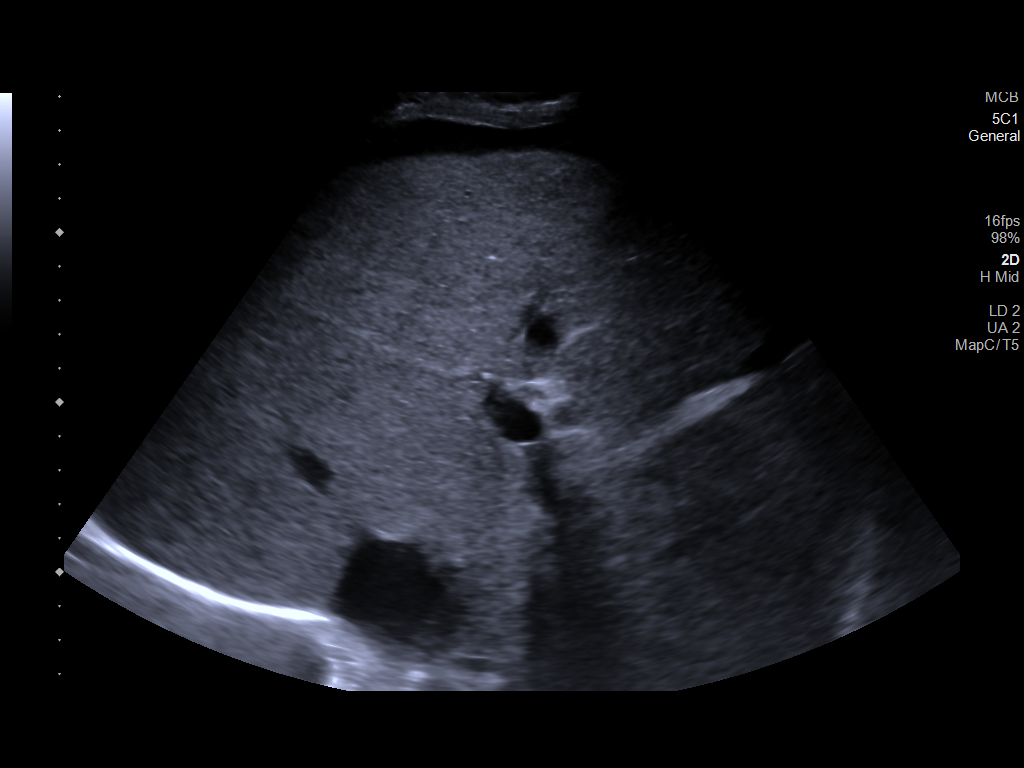
[im 2/5]
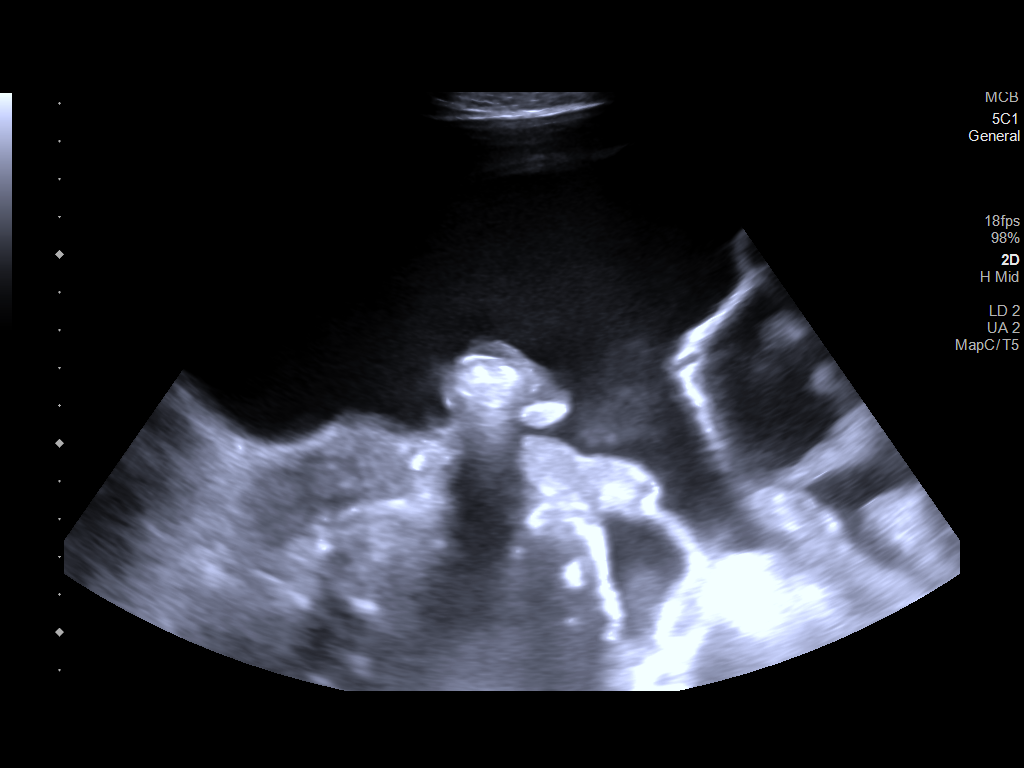
[im 3/5]
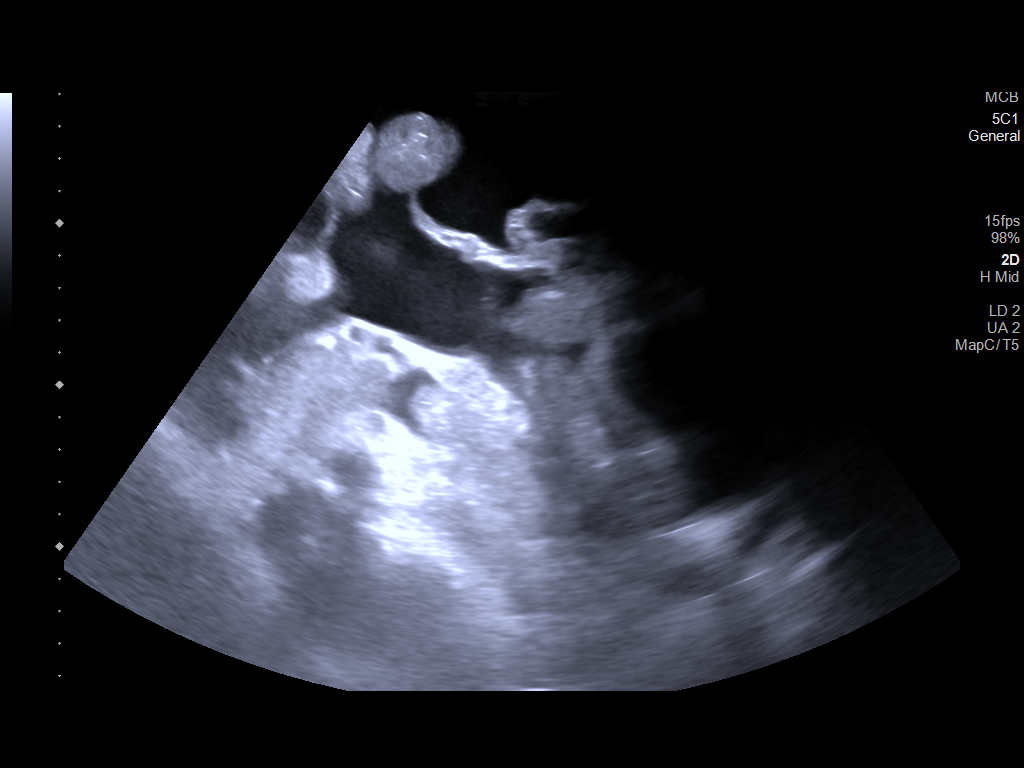
[im 4/5]
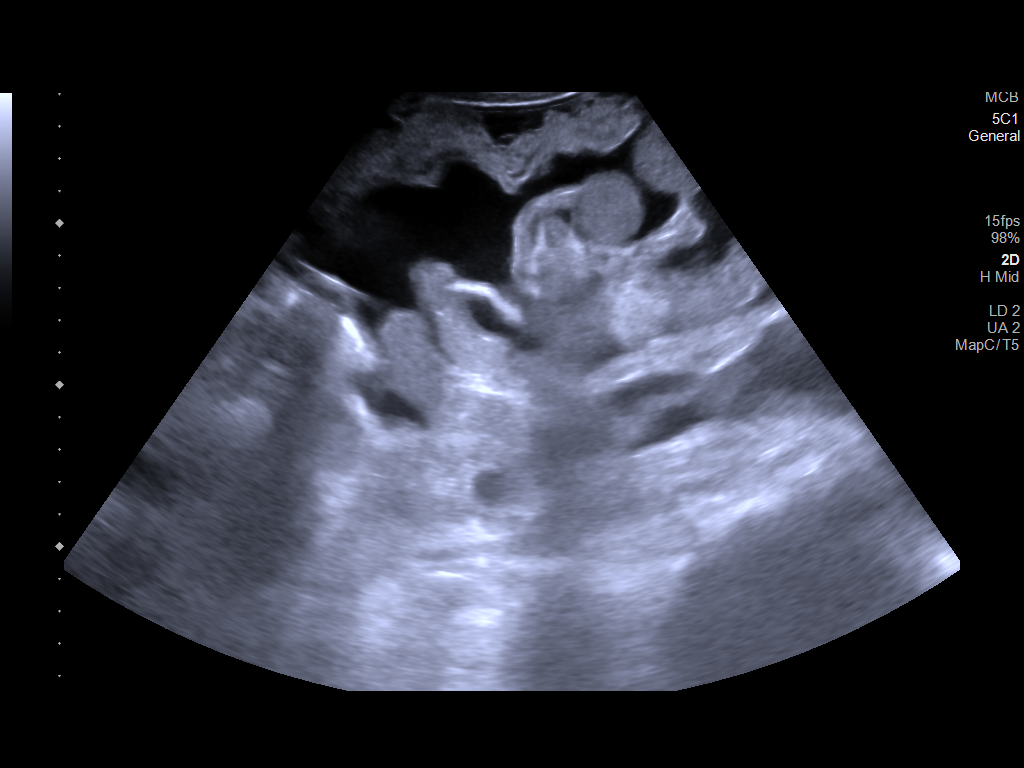
[im 5/5]
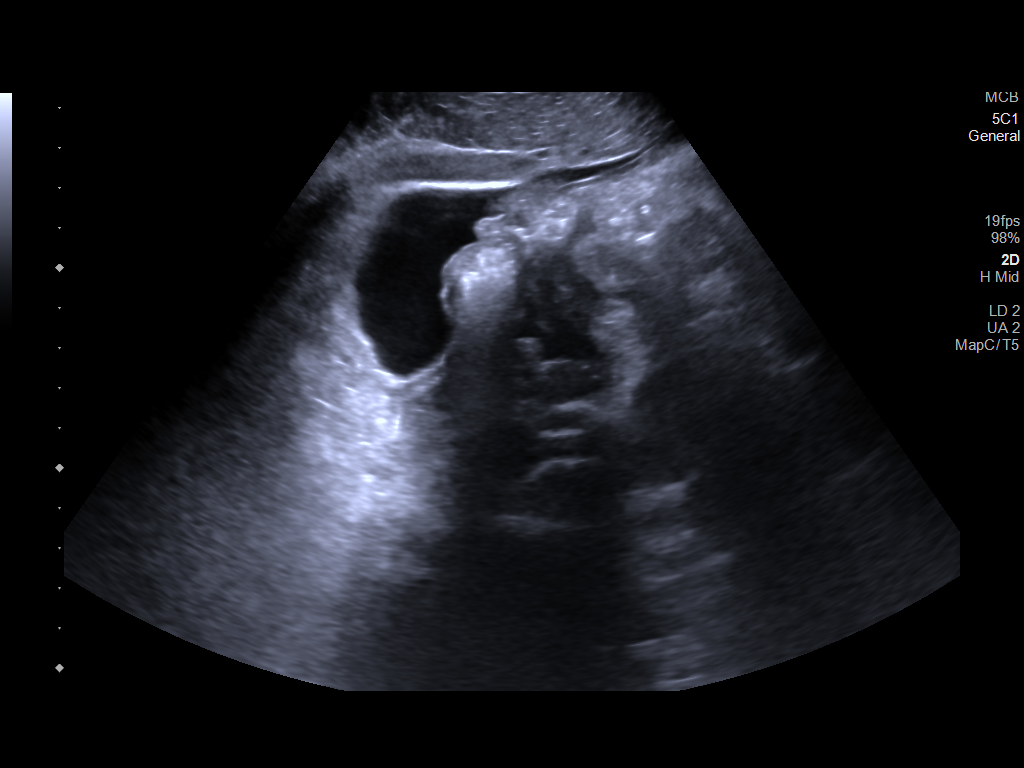

[5 of 5 positions shown; findings below may reference images not displayed]

EXAM:
ULTRASOUND GUIDED RIGHT LOWER QUADRANT PARACENTESIS

MEDICATIONS:
None.

COMPLICATIONS:
None immediate.

PROCEDURE:
Informed written consent was obtained from the patient after a
discussion of the risks, benefits and alternatives to treatment. A
timeout was performed prior to the initiation of the procedure.

Initial ultrasound scanning demonstrates a large amount of ascites
within the right lower abdominal quadrant. The right lower abdomen
was prepped and draped in the usual sterile fashion. 1% lidocaine
with epinephrine was used for local anesthesia.

Following this, a 19 gauge, 7-cm, Yueh catheter was introduced. An
ultrasound image was saved for documentation purposes. The
paracentesis was performed. The catheter was removed and a dressing
was applied. The patient tolerated the procedure well without
immediate post procedural complication.
FINDINGS: A total of approximately 4.1 L of clear yellow fluid was removed.
Samples were sent to the laboratory as requested by the clinical
team.
IMPRESSION: Successful ultrasound-guided paracentesis yielding 4.1 liters of
peritoneal fluid.

## 2020-09-08 MED ORDER — SODIUM CHLORIDE 0.9 % IV SOLN: 250.0000 mL | INTRAVENOUS | Status: DC

## 2020-09-08 MED ORDER — DEXTROSE 50 % IV SOLN: 12.5000 g | INTRAVENOUS | Status: AC

## 2020-09-08 MED ORDER — FENTANYL CITRATE (PF) 100 MCG/2ML IJ SOLN
INTRAMUSCULAR | Status: AC
  Filled 2020-09-08: qty 2

## 2020-09-08 MED ORDER — ORAL CARE MOUTH RINSE
15.0000 mL | Freq: Two times a day (BID) | OROMUCOSAL | Status: DC
  Administered 2020-09-09 – 2020-09-12 (×3): 15 mL via OROMUCOSAL

## 2020-09-08 MED ORDER — DEXTROSE 50 % IV SOLN
INTRAVENOUS | Status: AC
  Administered 2020-09-08: 12.5 g via INTRAVENOUS
  Administered 2020-09-08: 50 mL
  Filled 2020-09-08: qty 50

## 2020-09-08 MED ORDER — ETOMIDATE 2 MG/ML IV SOLN
INTRAVENOUS | Status: AC
  Filled 2020-09-08: qty 20

## 2020-09-08 MED ORDER — PHYTONADIONE 5 MG PO TABS
5.0000 mg | ORAL_TABLET | Freq: Once | ORAL | Status: DC
  Filled 2020-09-08: qty 1

## 2020-09-08 MED ORDER — VANCOMYCIN HCL IN DEXTROSE 750-5 MG/150ML-% IV SOLN: 750.0000 mg | INTRAVENOUS | Status: DC

## 2020-09-08 MED ORDER — SODIUM CHLORIDE 0.9% IV SOLUTION: Freq: Once | INTRAVENOUS | Status: DC

## 2020-09-08 MED ORDER — AMIODARONE LOAD VIA INFUSION
150.0000 mg | Freq: Once | INTRAVENOUS | Status: AC
  Administered 2020-09-08: 150 mg via INTRAVENOUS
  Filled 2020-09-08: qty 83.34

## 2020-09-08 MED ORDER — DEXTROSE 10 % IV SOLN: INTRAVENOUS | Status: DC

## 2020-09-08 MED ORDER — NOREPINEPHRINE 4 MG/250ML-% IV SOLN
2.0000 ug/min | INTRAVENOUS | Status: DC
  Administered 2020-09-08: 5 ug/min via INTRAVENOUS

## 2020-09-08 MED ORDER — ALBUMIN HUMAN 25 % IV SOLN
25.0000 g | Freq: Once | INTRAVENOUS | Status: AC
  Administered 2020-09-08: 25 g via INTRAVENOUS
  Filled 2020-09-08: qty 100

## 2020-09-08 MED ORDER — AMIODARONE HCL IN DEXTROSE 360-4.14 MG/200ML-% IV SOLN
30.0000 mg/h | INTRAVENOUS | Status: DC
  Administered 2020-09-08 – 2020-09-09 (×4): 30 mg/h via INTRAVENOUS
  Filled 2020-09-08 (×4): qty 200

## 2020-09-08 MED ORDER — AMIODARONE HCL IN DEXTROSE 360-4.14 MG/200ML-% IV SOLN
60.0000 mg/h | INTRAVENOUS | Status: AC
  Administered 2020-09-08 (×2): 60 mg/h via INTRAVENOUS

## 2020-09-08 MED ORDER — MIDAZOLAM HCL 2 MG/2ML IJ SOLN
INTRAMUSCULAR | Status: AC
  Filled 2020-09-08: qty 4

## 2020-09-08 MED ORDER — DEXTROSE 50 % IV SOLN
12.5000 g | INTRAVENOUS | Status: AC
  Administered 2020-09-08: 12.5 g via INTRAVENOUS

## 2020-09-08 MED ORDER — ROCURONIUM BROMIDE 10 MG/ML (PF) SYRINGE
PREFILLED_SYRINGE | INTRAVENOUS | Status: AC
  Filled 2020-09-08: qty 10

## 2020-09-08 MED ORDER — ETOMIDATE 2 MG/ML IV SOLN
INTRAVENOUS | Status: AC
  Filled 2020-09-08: qty 10

## 2020-09-08 MED ORDER — DEXTROSE 50 % IV SOLN
12.5000 g | INTRAVENOUS | Status: AC
  Administered 2020-09-08: 12.5 g via INTRAVENOUS
  Filled 2020-09-08: qty 50

## 2020-09-08 MED ORDER — VITAMIN K1 10 MG/ML IJ SOLN
5.0000 mg | Freq: Once | INTRAVENOUS | Status: AC
  Administered 2020-09-08: 5 mg via INTRAVENOUS
  Filled 2020-09-08: qty 0.5

## 2020-09-08 MED ORDER — SODIUM CHLORIDE 0.9 % IV SOLN
1.0000 g | INTRAVENOUS | Status: DC
  Administered 2020-09-09 – 2020-09-10 (×2): 1 g via INTRAVENOUS
  Filled 2020-09-08 (×2): qty 1

## 2020-09-08 NOTE — Progress Notes (Signed)
Pt was drowsy on arrival but responsive on room air then gradually became lethargic and difficult to arouse. Placed on 3 [place] shortly after. Started having frequent ectopy.    Bp was 101/62 (75) on arrival then drop to 83/46 (59) around 2330. Glucose was undetectable from being low.   Pt received 500 ml bolus, placed in trendelenburg until pressure came up. Then received calcium gluconate, 1 amp of bicarb, 1 amp of  D50, levophed started 2 mcg. Pt then became responsive. Transferred to 2 heart. Should receive bedside dialysis.

## 2020-09-08 NOTE — Progress Notes (Signed)
PCCM note  INR elevated Vitamin K 5mg   and FFP 2 units ordered  Marshell Garfinkel MD [facility] See Amion for pager  If no response to pager , please call [phone] until 7pm After 7:00 pm call Elink  [phone] 09/08/2020, 6:05 PM

## 2020-09-08 NOTE — Consult Note (Addendum)
NAME:  [name], MRN:  [id number], DOB:  07-Dec-1960, LOS: 1 ADMISSION DATE:  09/07/2020, CONSULTATION DATE:  09/07/2020 REFERRING MD:  Hal Hope, CHIEF COMPLAINT:  Vtach/ hypotension  Brief History:  50 yoM ESRD on dialysis with recent hand surgery on 2/24 for ischemic wounds to several fingers presenting from home with N/V/D after taking approximately 48,000 mg of tylenol after he ran out of narcotic pain medicine; tylenol level 18, within normal range.  Patient also missed Friday dialysis, no dialysis since 2/23.  Has been hypotensive, hypoglycemic, and hyperkalemic.  Was going to get dialysis but developed wide complex tachycardia with worsening hypotension, PCCM consulted.   History of Present Illness:  HPI obtained from medical chart review as patient is limited in his cooperativeness, resolving hypoglycemia.  60 year old male with past medical history significant for but not limited to ESRD on MWF iHD, cirrhosis, chronic Afib, COPD, CAD, and recent hand surgery on 2/24 for ischemic wounds several fingers who presents with one day history of nausea, vomiting, and diarrhea.  Patient reported running out of narcotic pain medicine therefore he starting taking tylenol for pain.  Reportedly took 48,000 mg of tylenol over 48 hour period, last took 2/26.  Denied SI/ HI in ER or other coingestions.  Additionally, he missed dialysis on 2/25, last dialysis 2/23.  Denies any SOB, fever/ chills, abdominal pain, or chest pain.   In ER, BP variable and only able to be obtained on his leg.  Otherwise vitals stable, afebrile, room air, and mentating well.  He was started on NAC bolus followed by infusion.  Acetaminophen level 18, Alk phos 268, AST 285, ALT 106, t.bili 1.4, INR 1.9, lactic acid 2.6, K 6.6, bicarb 7, glucose 56, BUN 90, sCr 14, WBC 16.3, Hgb 38.7, negative salicylate level, EKG showing prolonged QTc, CXR neg. Cultures sent and started on empiric cefepime and vancomycin.  Nephrology  consulted in ER with plans for dialysis tonight, admitted to [facility].  Once in PCU, patient hypotension, hypoglycemia and wide complex rhythm.  Was chemically treated for hyperkalemia with narrowing of QRS complex, also hypotensive as well.  Received a NS 500 ml bolus.  PCCM consulted for evaluation.   Past Medical History:   Past Medical History:  Diagnosis Date  . Anemia   . Anxiety   . Arthritis    left shoulder  . Atherosclerosis of aorta ([place])   . Cardiomegaly   . Chest pain    DATE UNKNOWN, C/O PERIODICALLY  . Cocaine abuse ([place])   . COPD exacerbation ([place]) 08/17/2016  . Coronary artery disease    stent 02/22/17  . Dysrhythmia   . ESRD (end stage renal disease) on dialysis ([place])    "[name]; MWF" (07/04/2017)  . GERD (gastroesophageal reflux disease)    DATE UNKNOWN  . Hemorrhoids   . Hepatitis B, chronic ([place])   . Hepatitis C   . History of kidney stones   . Hyperkalemia   . Hypertension   . Kidney failure   . Metabolic bone disease    Patient denies  . Mitral stenosis   . Myocardial infarction ([place])   . Pneumonia   . Pulmonary edema   . Solitary rectal ulcer syndrome 07/2017   at flex sig for rectal bleeding  . Tubular adenoma of colon    Significant Hospital Events:  2/27 admitted [place]  Consults:  PCCM  Procedures:   Significant Diagnostic Tests:   Micro Data:  2/27 SARS/ flu >> neg 2/27 BCx >>  2/27 MRSA >> neg  Antimicrobials:  2/27 cefepime >> 2/27 vanc >>  Interim History / Subjective:   Objective   Blood pressure 95/74, pulse 96, resp. rate 16, height 5' 9"  (1.753 m), weight 68 kg, SpO2 91 %.       No intake or output data in the 24 hours ending 09/08/20 0018 Filed Weights   09/07/20 1742  Weight: 68 kg    Examination: General:  Chronically ill appearing male in NAD HEENT: MM pink/dry, pupils 4/reactive Neuro: Answers to name, follows commands, oriented to self, answers limited questions, slowly improving after hypoglycemia CV: rr,  no murmur PULM:  Non labored, rales right base GI: soft, bs active  Extremities: warm/dry, no edema, RUE AVF Skin: no rashes   Resolved Hospital Problem list    Assessment & Plan:   Hyperkalemia with EKG changes Hypotension likely secondary to N/V/D, dry on exam  - transfer to ICU for closer monitoring - anticipate possible intermittent pressors for iHD, no UF - emergent iHD now, repeat K 7.5, Nephrology aware, coming to ICU for bedside dialysis - s/p calcium, insulin, D50, bicarb  - s/p 500 ml NS bolus, consider repeat/ albumin - peripheral NE as needed to keep MAP >60 as need on iHD, he has been difficult to obtain accurate BP measurements, most accurate on leg, will need to monitor mental status closely as sign of perfusion    ESRD on MWF via RUE AVF AGMA/ uremia  - iHD at bedside now - per nephrology - trend BMET   Leukocytosis - hand dressing CDI, will need to exam further once stable - possible stress response, CXR neg, afebrile, no fever/ chills - follow cultures and continue empiric abx for now  Hypoglycemia, likely related to stress/ cirrhosis/ N/V - monitor for hypoglycemia, CBG q 4 and prn   Tylenol overdose Transaminitis  - tylenol level 18 which is in normal range, reported last dose 2/26, questionable if actually took 48,000 mg of tylenol - continue NAC infusion and recheck tylenol level in am with LFTs and coags, if INR> 3 consider vit K and fomepizole per EDP documentation of poison control  - denied SI/ HI in ER.  He has a pain management doctor per Ortho note   Right index and ring finger ischemia and necrosis s/p amputation 2/24 - ortho f/u in 1 week from surgery   Hx Afib with RVR (not AC candidate given noncompliance) - holding home cardizem and metoprolol while on pressors - has been in NSR   Anemia of chronic disease - Hgb stable, trend  - transfuse for Hgb < 7 - ESA per nephrology    Hx Cirrhosis/ chronic hep B with frequent  paracentesis - no indication currently for paracentesis   Hx reported COPD - no AE, no wheezing, not on home inhalers - supplemental O2 for sat goal 88-94%   Best practice (evaluated daily)  Diet: NPO Pain/Anxiety/Delirium protocol (if indicated): n/a VAP protocol (if indicated): n/a DVT prophylaxis: SCDs GI prophylaxis: protonix when QTc better Glucose control: CBG q 4, add SSI if > 180 Mobility: BR for now Disposition: tx to ICU  Goals of Care:  Last date of multidisciplinary goals of care discussion: pending Family and staff present: n/a Summary of discussion: n/a Follow up goals of care discussion due: n/a Code Status: full   Labs   CBC: Recent Labs  Lab 09/04/20 1245 09/07/20 1731 09/07/20 1744 09/07/20 2340  WBC  --   --  16.3* 14.8*  NEUTROABS  --   --  14.0* 12.6*  HGB 12.6* 14.6 12.7* 11.9*  HCT 37.0* 43.0 35.6* 32.8*  MCV  --   --  85.0 82.2  PLT  --   --  296 440    Basic Metabolic Panel: Recent Labs  Lab 09/04/20 1245 09/07/20 1731 09/07/20 1744  NA 135 132* 133*  K 5.3* 6.4* 6.6*  CL 97*  --  88*  CO2  --   --  7*  GLUCOSE 69*  --  56*  BUN 55*  --  90*  CREATININE 9.40*  --  14.46*  CALCIUM  --   --  9.5   GFR: Estimated Creatinine Clearance: 5.4 mL/min (A) (by C-G formula based on SCr of 14.46 mg/dL (H)). Recent Labs  Lab 09/07/20 1744 09/07/20 1850 09/07/20 2340  WBC 16.3*  --  14.8*  LATICACIDVEN 2.8* 2.6*  --     Liver Function Tests: Recent Labs  Lab 09/07/20 1744  AST 285*  ALT 106*  ALKPHOS 268*  BILITOT 1.4*  PROT 8.2*  ALBUMIN 3.7   No results for input(s): LIPASE, AMYLASE in the last 168 hours. No results for input(s): AMMONIA in the last 168 hours.  ABG    Component Value Date/Time   PHART 7.427 01/14/2020 2322   PCO2ART 46.4 01/14/2020 2322   PO2ART 64.9 (L) 01/14/2020 2322   HCO3 7.8 (L) 09/07/2020 1731   TCO2 9 (L) 09/07/2020 1731   ACIDBASEDEF 22.0 (H) 09/07/2020 1731   O2SAT 41.0 09/07/2020 1731      Coagulation Profile: Recent Labs  Lab 09/07/20 1744  INR 1.9*    Cardiac Enzymes: No results for input(s): CKTOTAL, CKMB, CKMBINDEX, TROPONINI in the last 168 hours.  HbA1C: Hgb A1c MFr Bld  Date/Time Value Ref Range Status  01/26/2020 11:46 AM 4.8 4.8 - 5.6 % Final    Comment:    (NOTE) Pre diabetes:          5.7%-6.4%  Diabetes:              >6.4%  Glycemic control for   <7.0% adults with diabetes   09/26/2019 01:07 AM 4.7 (L) 4.8 - 5.6 % Final    Comment:    (NOTE) Pre diabetes:          5.7%-6.4% Diabetes:              >6.4% Glycemic control for   <7.0% adults with diabetes     CBG: Recent Labs  Lab 09/07/20 2340 09/07/20 2349  GLUCAP <10* 148*    Review of Systems:   Limited, as per HPI otherwise negative  Past Medical History:  He,  has a past medical history of Anemia, Anxiety, Arthritis, Atherosclerosis of aorta (HCC), Cardiomegaly, Chest pain, Cocaine abuse ([place]), COPD exacerbation ([place]) (08/17/2016), Coronary artery disease, Dysrhythmia, ESRD (end stage renal disease) on dialysis ([place]), GERD (gastroesophageal reflux disease), Hemorrhoids, Hepatitis B, chronic ([place]), Hepatitis C, History of kidney stones, Hyperkalemia, Hypertension, Kidney failure, Metabolic bone disease, Mitral stenosis, Myocardial infarction ([place]), Pneumonia, Pulmonary edema, Solitary rectal ulcer syndrome (07/2017), and Tubular adenoma of colon.   Surgical History:   Past Surgical History:  Procedure Laterality Date  . A/V FISTULAGRAM Left 05/26/2017   Procedure: A/V FISTULAGRAM;  Surgeon: Conrad Atmautluak, MD;  Location: [place] [facility];  Service: Cardiovascular;  Laterality: Left;  . A/V FISTULAGRAM Right 11/18/2017   Procedure: A/V FISTULAGRAM - Right Arm;  Surgeon: Elam Dutch, MD;  Location: [place]  CV LAB;  Service: Cardiovascular;  Laterality: Right;  . AMPUTATION Right 09/04/2020   Procedure: RIGHT INDEX AND RIGHT RING FINGER AMPUTATION DIGIT;  Surgeon: Leanora Cover, MD;  Location: [place];  Service: Orthopedics;  Laterality: Right;  . APPLICATION OF WOUND VAC Left 06/14/2017   Procedure: APPLICATION OF WOUND VAC;  Surgeon: Katha Cabal, MD;  Location: [facility];  Service: Vascular;  Laterality: Left;  . AV FISTULA PLACEMENT  2012   BELIEVED WAS PLACED IN JUNE  . AV FISTULA PLACEMENT Right 08/09/2017   Procedure: Creation Right arm ARTERIOVENOUS BRACHIOCEPOHALIC FISTULA;  Surgeon: Elam Dutch, MD;  [facility];  Service: Vascular;  Laterality: Right;  . AV FISTULA PLACEMENT Right 11/22/2017   Procedure: INSERTION OF ARTERIOVENOUS (AV) GORE-TEX GRAFT RIGHT UPPER ARM;  Surgeon: Elam Dutch, MD;  Location: [place];  Service: Vascular;  Laterality: Right;  . BIOPSY  01/25/2018   Procedure: BIOPSY;  Surgeon: Jerene Bears, MD;  Location: [place];  Service: Gastroenterology;;  . BIOPSY  04/10/2019   Procedure: BIOPSY;  Surgeon: Jerene Bears, MD;  Location: [facility];  Service: Gastroenterology;;  . COLONOSCOPY    . COLONOSCOPY WITH PROPOFOL N/A 01/25/2018   Procedure: COLONOSCOPY WITH PROPOFOL;  Surgeon: Jerene Bears, MD;  Location: [place];  Service: Gastroenterology;  Laterality: N/A;  . CORONARY STENT INTERVENTION N/A 02/22/2017   Procedure: CORONARY STENT INTERVENTION;  Surgeon: Nigel Mormon, MD;  Location: [place] [facility];  Service: Cardiovascular;  Laterality: N/A;  . ESOPHAGOGASTRODUODENOSCOPY (EGD) WITH PROPOFOL N/A 01/25/2018   Procedure: ESOPHAGOGASTRODUODENOSCOPY (EGD) WITH PROPOFOL;  Surgeon: Jerene Bears, MD;  Location: [place];  Service: Gastroenterology;  Laterality: N/A;  . ESOPHAGOGASTRODUODENOSCOPY (EGD) WITH PROPOFOL N/A 04/10/2019   Procedure: ESOPHAGOGASTRODUODENOSCOPY (EGD) WITH PROPOFOL;  Surgeon: Jerene Bears, MD;  Location: [facility];  Service: Gastroenterology;  Laterality: N/A;  . FLEXIBLE SIGMOIDOSCOPY N/A 07/15/2017   Procedure: FLEXIBLE SIGMOIDOSCOPY;  Surgeon: Carol Ada, MD;   Location: [place];  Service: Endoscopy;  Laterality: N/A;  . HEMORRHOID BANDING    . I & D EXTREMITY Left 06/01/2017   Procedure: IRRIGATION AND DEBRIDEMENT LEFT ARM HEMATOMA WITH LIGATION OF LEFT ARM AV FISTULA;  Surgeon: Elam Dutch, MD;  Location: [place];  Service: Vascular;  Laterality: Left;  . I & D EXTREMITY Left 06/14/2017   Procedure: IRRIGATION AND DEBRIDEMENT EXTREMITY;  Surgeon: Katha Cabal, MD;  Location: [facility];  Service: Vascular;  Laterality: Left;  . INSERTION OF DIALYSIS CATHETER  05/30/2017  . INSERTION OF DIALYSIS CATHETER N/A 05/30/2017   Procedure: INSERTION OF DIALYSIS CATHETER;  Surgeon: Elam Dutch, MD;  Location: [place];  Service: Vascular;  Laterality: N/A;  . IR PARACENTESIS  08/30/2017  . IR PARACENTESIS  09/29/2017  . IR PARACENTESIS  10/28/2017  . IR PARACENTESIS  11/09/2017  . IR PARACENTESIS  11/16/2017  . IR PARACENTESIS  11/28/2017  . IR PARACENTESIS  12/01/2017  . IR PARACENTESIS  12/06/2017  . IR PARACENTESIS  01/03/2018  . IR PARACENTESIS  01/23/2018  . IR PARACENTESIS  02/07/2018  . IR PARACENTESIS  02/21/2018  . IR PARACENTESIS  03/06/2018  . IR PARACENTESIS  03/17/2018  . IR PARACENTESIS  04/04/2018  . IR PARACENTESIS  12/28/2018  . IR PARACENTESIS  01/08/2019  . IR PARACENTESIS  01/23/2019  . IR PARACENTESIS  02/01/2019  . IR PARACENTESIS  02/19/2019  . IR PARACENTESIS  03/01/2019  . IR PARACENTESIS  03/15/2019  .  IR PARACENTESIS  04/03/2019  . IR PARACENTESIS  04/12/2019  . IR PARACENTESIS  05/01/2019  . IR PARACENTESIS  05/08/2019  . IR PARACENTESIS  05/24/2019  . IR PARACENTESIS  06/12/2019  . IR PARACENTESIS  07/09/2019  . IR PARACENTESIS  07/27/2019  . IR PARACENTESIS  08/09/2019  . IR PARACENTESIS  08/21/2019  . IR PARACENTESIS  09/17/2019  . IR PARACENTESIS  10/05/2019  . IR PARACENTESIS  10/29/2019  . IR PARACENTESIS  11/08/2019  . IR PARACENTESIS  12/12/2019  . IR PARACENTESIS  01/03/2020  . IR PARACENTESIS  01/10/2020  . IR PARACENTESIS   01/17/2020  . IR PARACENTESIS  01/24/2020  . IR PARACENTESIS  01/31/2020  . IR PARACENTESIS  02/07/2020  . IR PARACENTESIS  02/21/2020  . IR PARACENTESIS  05/21/2020  . IR RADIOLOGIST EVAL & MGMT  02/14/2018  . IR RADIOLOGIST EVAL & MGMT  02/22/2019  . LEFT HEART CATH AND CORONARY ANGIOGRAPHY N/A 02/22/2017   Procedure: LEFT HEART CATH AND CORONARY ANGIOGRAPHY;  Surgeon: Nigel Mormon, MD;  Location: [place] [facility];  Service: Cardiovascular;  Laterality: N/A;  . LIGATION OF ARTERIOVENOUS  FISTULA Left 0/08/3341   Procedure: Plication of Left Arm Arteriovenous Fistula;  Surgeon: Elam Dutch, MD;  Location: [place];  Service: Vascular;  Laterality: Left;  . POLYPECTOMY    . POLYPECTOMY  01/25/2018   Procedure: POLYPECTOMY;  Surgeon: Jerene Bears, MD;  Location: [place];  Service: Gastroenterology;;  . REVISON OF ARTERIOVENOUS FISTULA Left 5/68/6168   Procedure: PLICATION OF DISTAL ANEURYSMAL SEGEMENT OF LEFT UPPER ARM ARTERIOVENOUS FISTULA;  Surgeon: Elam Dutch, MD;  Location: [place];  Service: Vascular;  Laterality: Left;  . REVISON OF ARTERIOVENOUS FISTULA Left 3/72/9021   Procedure: Plication of Left Upper Arm Fistula ;  Surgeon: Waynetta Sandy, MD;  Location: [place];  Service: Vascular;  Laterality: Left;  . SKIN GRAFT SPLIT THICKNESS LEG / FOOT Left    SKIN GRAFT SPLIT THICKNESS LEFT ARM DONOR SITE: LEFT ANTERIOR THIGH  . SKIN SPLIT GRAFT Left 07/04/2017   Procedure: SKIN GRAFT SPLIT THICKNESS LEFT ARM DONOR SITE: LEFT ANTERIOR THIGH;  Surgeon: Elam Dutch, MD;  Location: [place];  Service: Vascular;  Laterality: Left;  . THROMBECTOMY W/ EMBOLECTOMY Left 06/05/2017   Procedure: EXPLORATION OF LEFT ARM FOR BLEEDING; OVERSEWED PROXIMAL FISTULA;  Surgeon: Angelia Mould, MD;  Location: [place];  Service: Vascular;  Laterality: Left;  . WOUND EXPLORATION Left 06/03/2017   Procedure: WOUND EXPLORATION WITH WOUND VAC APPLICATION TO LEFT ARM;  Surgeon: Angelia Mould, MD;  Location: [place];  Service: Vascular;  Laterality: Left;     Social History:   reports that he has been smoking cigarettes. He started smoking about 47 years ago. He has a 43.00 pack-year smoking history. He has never used smokeless tobacco. He reports previous alcohol use. He reports current drug use. Drugs: Marijuana and Cocaine.   Family History:  His family history includes COPD in his father and another family member; Esophageal cancer in his sister; Heart disease in his father and mother; Hypertension in an other family member; Lung cancer in his mother; Malignant hyperthermia in his father; Throat cancer in his sister and sister. There is no history of Colon cancer, Colon polyps, Rectal cancer, or Stomach cancer.   Allergies Allergies  Allergen Reactions  . Tramadol Itching and Other (See Comments)    Other reaction(s): Unknown  . Grass Extracts [Gramineae Pollens]  Other reaction(s): Sneezing  . Morphine And Related Other (See Comments)    Stomach pain  . Pollen Extract Other (See Comments)    Other reaction(s): Sneezing (finding)  . Acetaminophen Nausea Only    Stomach ache   . Aspirin Itching and Other (See Comments)    STOMACH PAIN  Other reaction(s): Unknown  . Clonidine Derivatives Itching     Home Medications  Prior to Admission medications   Medication Sig Start Date End Date Taking? Authorizing Provider  albuterol (VENTOLIN HFA) 108 (90 Base) MCG/ACT inhaler Inhale 2 puffs into the lungs every 4 (four) hours as needed for wheezing or shortness of breath. 07/10/20   Charlott Rakes, MD  amiodarone (PACERONE) 200 MG tablet Take 1 tablet (200 mg total) by mouth daily. 07/22/20   Cantwell, Celeste C, PA-C  cetirizine (ZYRTEC) 10 MG tablet Take 10 mg by mouth daily as needed. 06/23/20   [provider]  ciclopirox (PENLAC) 8 % solution Apply topically at bedtime. For 8 weeks Patient not taking: No sig reported 07/16/20   Charlott Rakes, MD   diltiazem (CARDIZEM CD) 120 MG 24 hr capsule TAKE 1 CAPSULE BY MOUTH EVERY DAY Patient taking differently: Take 120 mg by mouth daily. 06/11/20   Adrian Prows, MD  diphenhydrAMINE (BENADRYL) 25 mg capsule Take 1 capsule (25 mg total) by mouth every 6 (six) hours as needed for itching or allergies. Patient not taking: Reported on 09/03/2020 01/29/20   Shelly Coss, MD  gabapentin (NEURONTIN) 300 MG capsule Take 1 capsule (300 mg total) by mouth in the morning and at bedtime. 06/26/20   Charlott Rakes, MD  metoprolol succinate (TOPROL XL) 25 MG 24 hr tablet Take 1 tablet (25 mg total) by mouth daily. 07/22/20   Cantwell, Celeste C, PA-C  ondansetron (ZOFRAN) 4 MG tablet TAKE 1 TABLET BY MOUTH EVERY 8 HOURS AS NEEDED FOR NAUSEA AND VOMITING Patient taking differently: Take 4 mg by mouth every 8 (eight) hours as needed for nausea or vomiting. 08/29/20   Charlott Rakes, MD  oxyCODONE (ROXICODONE) 5 MG immediate release tablet 1 tab PO q6 hours prn pain 09/04/20   Leanora Cover, MD  Oxycodone HCl 10 MG TABS Take 10 mg by mouth 3 (three) times daily as needed (pain).  01/24/20   [provider]  pantoprazole (PROTONIX) 40 MG tablet Take 1 tablet (40 mg total) by mouth 2 (two) times daily. 03/25/20   Cantwell, Celeste C, PA-C  sevelamer carbonate (RENVELA) 800 MG tablet Take 2,400 mg by mouth See admin instructions. Take 3 tablets (2400 mg) by mouth up to three times daily with meals    [provider]  tenofovir (VIREAD) 300 MG tablet Take 1 by mouth every Monday. Patient not taking: No sig reported 05/22/20   Delora Fuel, MD     Critical care time: 20 mins     Kennieth Rad, ACNP [facility] 09/08/2020, 3:13 AM

## 2020-09-08 NOTE — Progress Notes (Addendum)
Pharmacy Re: Discussion with Poison Control  Called poison control [phone]) with updated evening labs.  Recommend to continue N-acetylcysteine for now since INR still rising.  They requested INR drawn 18 hrs after FFP given - will call them with result.  [place] Crane, Roylene Reason, BCCP Clinical Pharmacist  09/08/2020 6:21 PM   [facility] pharmacy phone numbers are listed on [email or web address]

## 2020-09-08 NOTE — Progress Notes (Signed)
eLink Physician-Brief Progress Note Patient Name: [name] DOB: 1961/06/15 MRN: [id number]   Date of Service  09/08/2020  HPI/Events of Note  Patient has an order for Vit K 5 mg PO but currently NPO. INR 3.2  eICU Interventions  Shift Vit K to IV     Intervention Category Intermediate Interventions: Other:  Judd Lien 09/08/2020, 10:19 PM

## 2020-09-08 NOTE — Progress Notes (Signed)
Subjective:  Had HD overnight-  No volume removal- just clearance; low BP seemed to limit UF- on room air-  Agitated this AM-  They are trying to draw blood-   Had low sugar  Objective Vital signs in last 24 hours: Vitals:   09/08/20 0615 09/08/20 0630 09/08/20 0700 09/08/20 0734  BP: (!) 100/59     Pulse: (!) 28 73 80   Resp: 18 17 (!) 22   Temp:    98.5 F (36.9 C)  TempSrc:    Axillary  SpO2: 99% 94% 95%   Weight:      Height:       Weight change:   Intake/Output Summary (Last 24 hours) at 09/08/2020 0753 Last data filed at 09/08/2020 0700 Gross per 24 hour  Intake 981.47 ml  Output 0 ml  Net 981.47 ml   OP HD: [place] M[facility]   4h  450/800  70kg  2/2 bath  Hep none  RUA AVG  - hect 1, sensipar 60   Assessment/ Plan: Pt is a 60 y.o. yo male with ESRD, cirrhosis, COPD, CAD who was admitted on 09/07/2020 with  missed HD and  Possible tylenol OD in the setting of finger amputations Assessment/Plan: 1. Tylenol OD-  Acetylcysteine and serial levels 2. ESRD -  Notorious non compliant with HD-  Missed Friday as OP.  Hd done here early this AM-  Next should be due on Wednesday if still here 3. Anemia- not an issue at present 4. Secondary hyperparathyroidism- will cont home hectorol and sensipar dosing - also on renvela 5. HTN/volume-  BP low right now- supposed to be on metoprolol and dilt as OP-  Unclear if taking  6. Hyperkalemia-  S/p HD overnight should have corrected -  Due to non compliance with HD- repeat ordered  7. Confusion-  Low sugar and also checking ammonia level    Louis Meckel    Labs: Basic Metabolic Panel: Recent Labs  Lab 09/07/20 1744 09/07/20 2340 09/08/20 0104  NA 133* 135 137  K 6.6* 7.5* 6.8*  CL 88* 86* 87*  CO2 7* <7* <7*  GLUCOSE 56* 61* 91  BUN 90* 94* 91*  CREATININE 14.46* 15.01* 14.76*  CALCIUM 9.5 9.3 9.8   Liver Function Tests: Recent Labs  Lab 09/07/20 1744 09/07/20 2340 09/08/20 0104  AST 285* 212* 197*  ALT 106* 89* 82*   ALKPHOS 268* 259* 244*  BILITOT 1.4* 1.3* 1.0  PROT 8.2* 7.7 7.4  ALBUMIN 3.7 3.4* 3.2*   No results for input(s): LIPASE, AMYLASE in the last 168 hours. No results for input(s): AMMONIA in the last 168 hours. CBC: Recent Labs  Lab 09/07/20 1744 09/07/20 2340 09/08/20 0104  WBC 16.3* 14.8* 15.0*  NEUTROABS 14.0* 12.6*  --   HGB 12.7* 11.9* 11.6*  HCT 35.6* 32.8* 31.4*  MCV 85.0 82.2 82.4  PLT 296 317 301   Cardiac Enzymes: No results for input(s): CKTOTAL, CKMB, CKMBINDEX, TROPONINI in the last 168 hours. CBG: Recent Labs  Lab 09/07/20 2349 09/08/20 0137 09/08/20 0412 09/08/20 0536 09/08/20 0612  GLUCAP 148* 73 68* 59* 79    Iron Studies: No results for input(s): IRON, TIBC, TRANSFERRIN, FERRITIN in the last 72 hours. Studies/Results: DG Chest Portable 1 View  Result Date: 09/07/2020 CLINICAL DATA:  Overdose. EXAM: PORTABLE CHEST 1 VIEW COMPARISON:  June 30, 2020. FINDINGS: Stable cardiomegaly. No pneumothorax or pleural effusion is noted. Both lungs are clear. The visualized skeletal structures are unremarkable. IMPRESSION: No active  disease. Electronically Signed   By: Marijo Conception M.D.   On: 09/07/2020 17:38   Medications: Infusions: . acetylcysteine 15 mg/kg/hr (09/08/20 0700)  . ceFEPime (MAXIPIME) IV Stopped (09/08/20 0511)  . dextrose 50 mL/hr at 09/08/20 0749  . norepinephrine (LEVOPHED) Adult infusion 2 mcg/min (09/08/20 0752)  . sodium chloride    . vancomycin      Scheduled Medications: . Chlorhexidine Gluconate Cloth  6 each Topical Q0600  . insulin aspart  10 Units Intravenous Once  . sevelamer carbonate  3,200 mg Oral TID with meals    have reviewed scheduled and prn medications.  Physical Exam: General: agitated while lab is trying to draw blood Heart: irreg Lungs: poor effort Abdomen: soft, non tender Extremities: no peripheral edema Dialysis Access: right upper arm AVG-  Bandaged-  Bruit not clearly heard but difficult to examine  due to agitation     09/08/2020,7:53 AM  LOS: 1 day

## 2020-09-09 DIAGNOSIS — R188 Other ascites: Secondary | ICD-10-CM | POA: Diagnosis not present

## 2020-09-09 DIAGNOSIS — T391X4A Poisoning by 4-Aminophenol derivatives, undetermined, initial encounter: Secondary | ICD-10-CM | POA: Diagnosis not present

## 2020-09-09 LAB — COMPREHENSIVE METABOLIC PANEL
ALT: 60 U/L — ABNORMAL HIGH (ref 0–44)
AST: 178 U/L — ABNORMAL HIGH (ref 15–41)
Albumin: 3.4 g/dL — ABNORMAL LOW (ref 3.5–5.0)
Alkaline Phosphatase: 216 U/L — ABNORMAL HIGH (ref 38–126)
Anion gap: 31 — ABNORMAL HIGH (ref 5–15)
BUN: 32 mg/dL — ABNORMAL HIGH (ref 6–20)
CO2: 15 mmol/L — ABNORMAL LOW (ref 22–32)
Calcium: 10.5 mg/dL — ABNORMAL HIGH (ref 8.9–10.3)
Chloride: 89 mmol/L — ABNORMAL LOW (ref 98–111)
Creatinine, Ser: 8.05 mg/dL — ABNORMAL HIGH (ref 0.61–1.24)
GFR, Estimated: 7 mL/min — ABNORMAL LOW (ref >60)
Glucose, Bld: 133 mg/dL — ABNORMAL HIGH (ref 70–99)
Potassium: 3.7 mmol/L (ref 3.5–5.1)
Sodium: 135 mmol/L (ref 135–145)
Total Bilirubin: 1.4 mg/dL — ABNORMAL HIGH (ref 0.3–1.2)
Total Protein: 7.3 g/dL (ref 6.5–8.1)

## 2020-09-09 LAB — GLUCOSE, CAPILLARY
Glucose-Capillary: 121 mg/dL — ABNORMAL HIGH (ref 70–99)
Glucose-Capillary: 139 mg/dL — ABNORMAL HIGH (ref 70–99)
Glucose-Capillary: 140 mg/dL — ABNORMAL HIGH (ref 70–99)
Glucose-Capillary: 145 mg/dL — ABNORMAL HIGH (ref 70–99)
Glucose-Capillary: 152 mg/dL — ABNORMAL HIGH (ref 70–99)
Glucose-Capillary: 168 mg/dL — ABNORMAL HIGH (ref 70–99)

## 2020-09-09 LAB — ACETAMINOPHEN LEVEL: Acetaminophen (Tylenol), Serum: <10 ug/mL — ABNORMAL LOW (ref 10–30)

## 2020-09-09 LAB — CBC
HCT: 28.9 % — ABNORMAL LOW (ref 39.0–52.0)
Hemoglobin: 10.6 g/dL — ABNORMAL LOW (ref 13.0–17.0)
MCH: 29.4 pg (ref 26.0–34.0)
MCHC: 36.7 g/dL — ABNORMAL HIGH (ref 30.0–36.0)
MCV: 80.3 fL (ref 80.0–100.0)
Platelets: 189 10*3/uL (ref 150–400)
RBC: 3.6 MIL/uL — ABNORMAL LOW (ref 4.22–5.81)
RDW: 15.8 % — ABNORMAL HIGH (ref 11.5–15.5)
WBC: 14.6 10*3/uL — ABNORMAL HIGH (ref 4.0–10.5)
nRBC: 2 % — ABNORMAL HIGH (ref 0.0–0.2)

## 2020-09-09 LAB — BPAM FFP
Blood Product Expiration Date: 202203032359
Blood Product Expiration Date: 202203032359
ISSUE DATE / TIME: 202202281902
ISSUE DATE / TIME: 202202281902
Unit Type and Rh: 5100
Unit Type and Rh: 9500

## 2020-09-09 LAB — PREPARE FRESH FROZEN PLASMA: Unit division: 0

## 2020-09-09 LAB — ALBUMIN, PLEURAL OR PERITONEAL FLUID: Albumin, Fluid: 2.1 g/dL

## 2020-09-09 LAB — PROTIME-INR
INR: 2.5 — ABNORMAL HIGH (ref 0.8–1.2)
INR: 1.8 — ABNORMAL HIGH (ref 0.8–1.2)
Prothrombin Time: 25.9 seconds — ABNORMAL HIGH (ref 11.4–15.2)
Prothrombin Time: 20.1 seconds — ABNORMAL HIGH (ref 11.4–15.2)

## 2020-09-09 LAB — BODY FLUID CELL COUNT WITH DIFFERENTIAL
Eos, Fluid: 0 %
Lymphs, Fluid: 26 %
Monocyte-Macrophage-Serous Fluid: 47 % — ABNORMAL LOW (ref 50–90)
Neutrophil Count, Fluid: 27 % — ABNORMAL HIGH (ref 0–25)
Total Nucleated Cell Count, Fluid: 465 cu mm (ref 0–1000)

## 2020-09-09 LAB — PROTEIN, PLEURAL OR PERITONEAL FLUID: Total protein, fluid: 4.3 g/dL

## 2020-09-09 LAB — ALBUMIN: Albumin: 2.9 g/dL — ABNORMAL LOW (ref 3.5–5.0)

## 2020-09-09 LAB — LACTATE DEHYDROGENASE, PLEURAL OR PERITONEAL FLUID: LD, Fluid: 128 U/L — ABNORMAL HIGH (ref 3–23)

## 2020-09-09 MED ORDER — CHLORHEXIDINE GLUCONATE CLOTH 2 % EX PADS
6.0000 | MEDICATED_PAD | Freq: Every day | CUTANEOUS | Status: DC
  Administered 2020-09-10 – 2020-09-12 (×2): 6 via TOPICAL

## 2020-09-09 MED ORDER — DEXMEDETOMIDINE HCL IN NACL 400 MCG/100ML IV SOLN
0.4000 ug/kg/h | INTRAVENOUS | Status: DC
  Administered 2020-09-09: 0.3 ug/kg/h via INTRAVENOUS
  Administered 2020-09-09 – 2020-09-10 (×2): 0.4 ug/kg/h via INTRAVENOUS
  Filled 2020-09-09 (×2): qty 100

## 2020-09-09 MED ORDER — NOREPINEPHRINE 4 MG/250ML-% IV SOLN
INTRAVENOUS | Status: AC
  Filled 2020-09-09: qty 250

## 2020-09-09 MED ORDER — NOREPINEPHRINE 4 MG/250ML-% IV SOLN: 0.0000 ug/min | INTRAVENOUS | Status: DC

## 2020-09-09 MED ORDER — FENTANYL CITRATE (PF) 100 MCG/2ML IJ SOLN
25.0000 ug | Freq: Once | INTRAMUSCULAR | Status: AC
  Administered 2020-09-09: 25 ug via INTRAVENOUS
  Filled 2020-09-09: qty 2

## 2020-09-09 MED ORDER — SODIUM CHLORIDE 0.9 % IV SOLN: 250.0000 mL | INTRAVENOUS | Status: DC

## 2020-09-09 MED ORDER — NOREPINEPHRINE 4 MG/250ML-% IV SOLN
2.0000 ug/min | INTRAVENOUS | Status: DC
  Administered 2020-09-09: 3 ug/min via INTRAVENOUS
  Filled 2020-09-09: qty 250

## 2020-09-09 NOTE — Progress Notes (Signed)
Bedside paracentesis by MD and NP. 70mL red tinged fluid removed. Samples obtained and sent. Pt VS tolerated on 4LNC, Afib 90's, BP stable on levophed. Bandaid applied to site, CDI.   Deandre Brannan RN

## 2020-09-09 NOTE — Progress Notes (Signed)
Subjective:  Req some pressor support overnight-  INR climbing- got treated.  He is restless this AM-  Repeats my name but no other interaction-  Now on d10 at 75 per hour for sugar-  96% on room air- on amio and acetylcysteine drips   Objective Vital signs in last 24 hours: Vitals:   09/09/20 0345 09/09/20 0400 09/09/20 0500 09/09/20 0600  BP:  118/76 (!) 89/67 117/72  Pulse:  (!) 105 (!) 54 (!) 104  Resp:  20 14 10   Temp: (!) 97.5 F (36.4 C)     TempSrc: Axillary     SpO2:  90% 90% 93%  Weight:      Height:       Weight change: 3.3 kg  Intake/Output Summary (Last 24 hours) at 09/09/2020 0719 Last data filed at 09/09/2020 0600 Gross per 24 hour  Intake 3333.99 ml  Output --  Net 3333.99 ml   OP HD: [facility]   4h  450/800  70kg  2/2 bath  Hep none  RUA AVG  - hect 1, sensipar 60   Assessment/ Plan: Pt is a 60 y.o. yo male with ESRD, cirrhosis, COPD, CAD who was admitted on 09/07/2020 with  missed HD and  Possible tylenol OD in the setting of finger amputations/attempting pain control  Assessment/Plan: 1. Tylenol OD-  Acetylcysteine and serial levels-  INR up but trending down 2. ESRD -  Notorious non compliant with HD-  Missed Friday as OP.  Hd done here early Mon AM-  Next should be due tomorrow- will try for early  3. Anemia- not an issue at present but dropping with elevated INR 4. Secondary hyperparathyroidism- will cont sensipar dosing with HD tomorrow, hold hectorol wth high calcium- also on renvela but currently NPO 5. HTN/volume-  BP low right now- supposed to be on metoprolol and dilt as OP-  Unclear if taking - now on amio.  Volume status seems OK-  Getting IVF for sugar, would try to wean when can  6. Hyperkalemia-  S/p emergent HD on admit and corrected-  Due to non compliance with HD-  7. Confusion-  Low sugar, corrected -  ammonia level slightly high-  Supportive care.  Is angry at baseline but usually oriented   [name]    Labs: Basic Metabolic  Panel: Recent Labs  Lab 09/08/20 0104 09/08/20 0811 09/09/20 0304  NA 137 137 135  K 6.8* 3.9 3.7  CL 87* 93* 89*  CO2 <7* 15* 15*  GLUCOSE 91 118* 133*  BUN 91* 29* 32*  CREATININE 14.76* 6.72* 8.05*  CALCIUM 9.8 9.3 10.5*   Liver Function Tests: Recent Labs  Lab 09/08/20 0811 09/08/20 1641 09/09/20 0304  AST 167* 177* 178*  ALT 63* 58* 60*  ALKPHOS 203* 219* 216*  BILITOT 1.4* 1.4* 1.4*  PROT 7.1 7.3 7.3  ALBUMIN 3.3* 3.3* 3.4*   No results for input(s): LIPASE, AMYLASE in the last 168 hours. Recent Labs  Lab 09/08/20 0811  AMMONIA 41*   CBC: Recent Labs  Lab 09/07/20 1744 09/07/20 2340 09/08/20 0104 09/09/20 0304  WBC 16.3* 14.8* 15.0* 14.6*  NEUTROABS 14.0* 12.6*  --   --   HGB 12.7* 11.9* 11.6* 10.6*  HCT 35.6* 32.8* 31.4* 28.9*  MCV 85.0 82.2 82.4 80.3  PLT 296 317 301 189   Cardiac Enzymes: No results for input(s): CKTOTAL, CKMB, CKMBINDEX, TROPONINI in the last 168 hours. CBG: Recent Labs  Lab 09/08/20 1531 09/08/20 1823 09/08/20 1919  09/08/20 2347 09/09/20 0341  GLUCAP 125* 141* 145* 136* 145*    Iron Studies: No results for input(s): IRON, TIBC, TRANSFERRIN, FERRITIN in the last 72 hours. Studies/Results: DG Chest Portable 1 View  Result Date: 09/07/2020 CLINICAL DATA:  Overdose. EXAM: PORTABLE CHEST 1 VIEW COMPARISON:  June 30, 2020. FINDINGS: Stable cardiomegaly. No pneumothorax or pleural effusion is noted. Both lungs are clear. The visualized skeletal structures are unremarkable. IMPRESSION: No active disease. Electronically Signed   By: Marijo Conception M.D.   On: 09/07/2020 17:38   Medications: Infusions: . sodium chloride    . acetylcysteine 15 mg/kg/hr (09/09/20 0600)  . amiodarone 30 mg/hr (09/09/20 0600)  . ceFEPime (MAXIPIME) IV Stopped (09/09/20 0509)  . dextrose 75 mL/hr at 09/09/20 0634  . norepinephrine (LEVOPHED) Adult infusion Stopped (09/08/20 2204)  . sodium chloride    . vancomycin      Scheduled  Medications: . sodium chloride   Intravenous Once  . Chlorhexidine Gluconate Cloth  6 each Topical Q0600  . insulin aspart  10 Units Intravenous Once  . mouth rinse  15 mL Mouth Rinse BID  . sevelamer carbonate  3,200 mg Oral TID with meals    have reviewed scheduled and prn medications.  Physical Exam: General: restless, repeats my name, not really answering specific questions Heart: irreg Lungs: poor effort Abdomen: soft, non tender Extremities: no peripheral edema Dialysis Access: right upper arm AVG-  Bandaged-  Bruit heard    09/09/2020,7:19 AM  LOS: 2 days

## 2020-09-09 NOTE — Progress Notes (Signed)
eLink Physician-Brief Progress Note Patient Name: [name] DOB: [birth year] MRN: [id number]   Date of Service  09/09/2020  HPI/Events of Note  Patient c/o pain at finger amputation site. Allergic to Tramadol, Morphine, ASA and Tylenol.   eICU Interventions  Plan: 1. Fentanyl 25 mcg IV X 1 now.      Intervention Category Major Interventions: Other:  Lysle Dingwall 09/09/2020, 10:35 PM

## 2020-09-09 NOTE — Progress Notes (Signed)
Shift Summary: RASS +3 this AM, precedex gtt started. Paracentesis done at bedside, 3L removed of red fluid.   N: AAO only to self this AM. Noncompliant, attempting to exit bed, refusing care. Precedex gtt started, now arouses to pain, RASS -2/-3. W/D extremities, does not follow commands. PERRLA, afebrile, no complaints of pain.   R: Refusing [place] this AM, desatting into 80's. Able to place [facility] after starting precedex, satting 98-100%. Clear lungs.   CV: Afib 70-120 throughout shift, amio gtt running. After starting precedex, mild hypotension requiring levophed gtt. Low dose levophed managing BP goals.   GI: Firm abdomen before tapping, soft after removing fluid. Puncture site covered with bandaid with scant drainage. No BM. BG climbing this afternoon, D10 turned off with evening BG WDL. Acytelcysteine running.   GU: aneuric. Scheduled for HD tomorrow.   Friend at bedside briefly this afternoon.   Neyla Gauntt RN

## 2020-09-09 NOTE — Procedures (Signed)
Paracentesis Procedure Note  [name]  [id number]  09/12/1960  Date:09/09/20  Time:3:34 PM   Provider Performing:Brooke Moshe Cipro and Marni Griffon  Procedure: Paracentesis with imaging guidance [phone])  Indication(s) Ascites  Consent Risks of the procedure as well as the alternatives and risks of each were explained to the patient and/or caregiver.  Consent for the procedure was obtained and is signed in the bedside chart  Anesthesia Topical only with 1% lidocaine   Time Out Verified patient identification, verified procedure, site/side was marked, verified correct patient position, special equipment/implants available, medications/allergies/relevant history reviewed, required imaging and test results available.   Sterile Technique Maximal sterile technique including full sterile barrier drape, hand hygiene, sterile gown, sterile gloves, mask, hair covering, sterile ultrasound probe cover (if used).   Procedure Description Ultrasound used to identify appropriate peritoneal anatomy for placement and overlying skin marked.  Area of drainage cleaned and draped in sterile fashion. Lidocaine was used to anesthetize the skin and subcutaneous tissue.  3000 cc's of reddish brown appearing fluid was drained. Catheter then removed and bandaid applied to site.  Complications/Tolerance None; patient tolerated the procedure well.  EBL Minimal  Specimen(s) Peritoneal fluid sent for cytology, cell count with diff, culture, gram stain, LDH, albumin, and protein    [name], ACNP [facility] 09/09/2020, 3:36 PM

## 2020-09-09 NOTE — Progress Notes (Signed)
Pharmacy Note: Discussion with Poison Control  Spoke with Poison Control [phone]) with updated evening labs. Nurse discussed with Toxicologist, who recommended to d/c N-acetylcysteine at this time with INR trending down. Per discussion earlier today with Dr. Valeta Harms (CCM), ok to d/c N-acetylcysteine based on Poison Control's recommendation.   Arturo Morton, PharmD, BCPS Please check AMION for all [place] contact numbers Clinical Pharmacist 09/09/2020 7:55 PM

## 2020-09-09 NOTE — Progress Notes (Signed)
NAME:  [name], MRN:  [id number], DOB:  04-08-1961, LOS: 2 ADMISSION DATE:  09/07/2020, CONSULTATION DATE:  09/07/2020 REFERRING MD:  Hal Hope, CHIEF COMPLAINT:  Vtach/ hypotension  Brief History:  19 yoM ESRD on dialysis with recent hand surgery on 2/24 for ischemic wounds to several fingers presenting from home with N/V/D after taking approximately 48,000 mg of tylenol after he ran out of narcotic pain medicine; tylenol level 18, within normal range.  Patient also missed Friday dialysis, no dialysis since 2/23.  Has been hypotensive, hypoglycemic, and hyperkalemic.  Was going to get dialysis but developed wide complex tachycardia with worsening hypotension, PCCM consulted.   History of Present Illness:  HPI obtained from medical chart review as patient is limited in his cooperativeness, resolving hypoglycemia.  60 year old male with past medical history significant for but not limited to ESRD on MWF iHD, cirrhosis, chronic Afib, COPD, CAD, and recent hand surgery on 2/24 for ischemic wounds several fingers who presents with one day history of nausea, vomiting, and diarrhea.  Patient reported running out of narcotic pain medicine therefore he starting taking tylenol for pain.  Reportedly took 48,000 mg of tylenol over 48 hour period, last took 2/26.  Denied SI/ HI in ER or other coingestions.  Additionally, he missed dialysis on 2/25, last dialysis 2/23.  Denies any SOB, fever/ chills, abdominal pain, or chest pain.   In ER, BP variable and only able to be obtained on his leg.  Otherwise vitals stable, afebrile, room air, and mentating well.  He was started on NAC bolus followed by infusion.  Acetaminophen level 18, Alk phos 268, AST 285, ALT 106, t.bili 1.4, INR 1.9, lactic acid 2.6, K 6.6, bicarb 7, glucose 56, BUN 90, sCr 14, WBC 16.3, Hgb 77.4, negative salicylate level, EKG showing prolonged QTc, CXR neg. Cultures sent and started on empiric cefepime and vancomycin.  Nephrology  consulted in ER with plans for dialysis tonight, admitted to [facility].  Once in PCU, patient hypotension, hypoglycemia and wide complex rhythm.  Was chemically treated for hyperkalemia with narrowing of QRS complex, also hypotensive as well.  Received a NS 500 ml bolus.  PCCM consulted for evaluation.   Past Medical History:   Past Medical History:  Diagnosis Date  . Anemia   . Anxiety   . Arthritis    left shoulder  . Atherosclerosis of aorta ([place])   . Cardiomegaly   . Chest pain    DATE UNKNOWN, C/O PERIODICALLY  . Cocaine abuse ([place])   . COPD exacerbation ([place]) 08/17/2016  . Coronary artery disease    stent 02/22/17  . Dysrhythmia   . ESRD (end stage renal disease) on dialysis ([place])    "[name]; MWF" (07/04/2017)  . GERD (gastroesophageal reflux disease)    DATE UNKNOWN  . Hemorrhoids   . Hepatitis B, chronic ([place])   . Hepatitis C   . History of kidney stones   . Hyperkalemia   . Hypertension   . Kidney failure   . Metabolic bone disease    Patient denies  . Mitral stenosis   . Myocardial infarction ([place])   . Pneumonia   . Pulmonary edema   . Solitary rectal ulcer syndrome 07/2017   at flex sig for rectal bleeding  . Tubular adenoma of colon    Significant Hospital Events:  2/27 admitted [place]  Consults:  PCCM  Procedures:   Significant Diagnostic Tests:   Micro Data:  2/27 SARS/ flu >> neg 2/27 BCx >>  2/27 MRSA >> neg  Antimicrobials:  2/27 cefepime >> 2/27 vanc >>  Interim History / Subjective:   Remains encephalopathic and confused.   Objective   Blood pressure 117/72, pulse (!) 104, temperature (!) 97.5 F (36.4 C), temperature source Axillary, resp. rate 10, height 5' 9" (1.753 m), weight 71.3 kg, SpO2 93 %.        Intake/Output Summary (Last 24 hours) at 09/09/2020 0818 Last data filed at 09/09/2020 0600 Gross per 24 hour  Intake 3288.37 ml  Output --  Net 3288.37 ml   Filed Weights   09/07/20 1742 09/08/20 0500 09/09/20 0104  Weight: 68 kg  70.9 kg 71.3 kg    Examination: General:  frailed debilitated male,  HEENT: MMM, tracking  Neuro: follows commands, moves all 4 extremities  CV: RRR, s1 s2 PULM:  ctab GI: soft, nt nd  Extremities: no edema  Skin: no rash   Resolved Hospital Problem list    Assessment & Plan:    ESRD, missed iHD PTA Hyperkalemia with EKG changes Hypotension likely secondary to N/V/D, dry on exam  - BP better - continue to watch  - HD per nephrology   Leukocytosis - hand dressing CDI, will need to exam further once stable Plan:  De-escalate abx  No clear sources of sepsis  Hypoglycemia, likely related to stress/ cirrhosis/ N/V Plan: On d10  Poor po intake at this time  May need a cortrack   Acute metabolic encephalopathy secondary to above P: Start precedex to handle agitation  Tylenol overdose H/o polysubstance abuse  Transaminitis  Elevated INR  - tylenol level 18 which is in normal range, reported last dose 2/26, questionable if actually took 48,000 mg of tylenol - denied SI/ HI in ER.  He has a pain management doctor per Ortho note P:  Follow LFTS and INR  Continue NAC   Right index and ring finger ischemia and necrosis s/p amputation 2/24 - ortho f/u in 1 week from surgery  Hx Afib with RVR (not AC candidate given noncompliance) - amiodarone ggt transitioned of po amiodarone   Anemia of chronic disease - Hgb stable, trend  - ESA per nephrology  P: Observe   Hx Cirrhosis/ chronic hep B with frequent paracentesis Recurrent Ascites  - no indication currently for paracentesis - may need to consider at somepoint   Hx reported COPD - prn nebs    Best practice (evaluated daily)  Diet: NPO Pain/Anxiety/Delirium protocol (if indicated): n/a VAP protocol (if indicated): n/a DVT prophylaxis: SCDs GI prophylaxis: protonix when QTc better Glucose control: CBG q 4, add SSI if > 180 Mobility: BR for now Disposition: tx to ICU  Goals of Care:  Last date of  multidisciplinary goals of care discussion: pending Family and staff present: n/a Summary of discussion: n/a Follow up goals of care discussion due: n/a Code Status: full   Labs   CBC: Recent Labs  Lab 09/07/20 1731 09/07/20 1744 09/07/20 2340 09/08/20 0104 09/09/20 0304  WBC  --  16.3* 14.8* 15.0* 14.6*  NEUTROABS  --  14.0* 12.6*  --   --   HGB 14.6 12.7* 11.9* 11.6* 10.6*  HCT 43.0 35.6* 32.8* 31.4* 28.9*  MCV  --  85.0 82.2 82.4 80.3  PLT  --  296 317 301 503    Basic Metabolic Panel: Recent Labs  Lab 09/07/20 1744 09/07/20 2340 09/08/20 0104 09/08/20 0811 09/09/20 0304  NA 133* 135 137 137 135  K 6.6* 7.5* 6.8* 3.9 3.7  CL 88* 86* 87* 93* 89*  CO2 7* <7* <7* 15* 15*  GLUCOSE 56* 61* 91 118* 133*  BUN 90* 94* 91* 29* 32*  CREATININE 14.46* 15.01* 14.76* 6.72* 8.05*  CALCIUM 9.5 9.3 9.8 9.3 10.5*  MG  --  2.8*  --   --   --    GFR: Estimated Creatinine Clearance: 10 mL/min (A) (by C-G formula based on SCr of 8.05 mg/dL (H)). Recent Labs  Lab 09/07/20 1744 09/07/20 1850 09/07/20 2340 09/08/20 0104 09/09/20 0304  WBC 16.3*  --  14.8* 15.0* 14.6*  LATICACIDVEN 2.8* 2.6*  --   --   --     Liver Function Tests: Recent Labs  Lab 09/07/20 2340 09/08/20 0104 09/08/20 0811 09/08/20 1641 09/09/20 0304  AST 212* 197* 167* 177* 178*  ALT 89* 82* 63* 58* 60*  ALKPHOS 259* 244* 203* 219* 216*  BILITOT 1.3* 1.0 1.4* 1.4* 1.4*  PROT 7.7 7.4 7.1 7.3 7.3  ALBUMIN 3.4* 3.2* 3.3* 3.3* 3.4*   No results for input(s): LIPASE, AMYLASE in the last 168 hours. Recent Labs  Lab 09/08/20 0811  AMMONIA 41*    ABG    Component Value Date/Time   PHART 7.427 01/14/2020 2322   PCO2ART 46.4 01/14/2020 2322   PO2ART 64.9 (L) 01/14/2020 2322   HCO3 7.8 (L) 09/07/2020 1731   TCO2 9 (L) 09/07/2020 1731   ACIDBASEDEF 22.0 (H) 09/07/2020 1731   O2SAT 41.0 09/07/2020 1731     Coagulation Profile: Recent Labs  Lab 09/07/20 1744 09/08/20 0104 09/08/20 1641  09/09/20 0304  INR 1.9* 2.5* 3.2* 2.5*    Cardiac Enzymes: No results for input(s): CKTOTAL, CKMB, CKMBINDEX, TROPONINI in the last 168 hours.  HbA1C: Hgb A1c MFr Bld  Date/Time Value Ref Range Status  01/26/2020 11:46 AM 4.8 4.8 - 5.6 % Final    Comment:    (NOTE) Pre diabetes:          5.7%-6.4%  Diabetes:              >6.4%  Glycemic control for   <7.0% adults with diabetes   09/26/2019 01:07 AM 4.7 (L) 4.8 - 5.6 % Final    Comment:    (NOTE) Pre diabetes:          5.7%-6.4% Diabetes:              >6.4% Glycemic control for   <7.0% adults with diabetes     CBG: Recent Labs  Lab 09/08/20 1823 09/08/20 1919 09/08/20 2347 09/09/20 0341 09/09/20 0744  GLUCAP 141* 145* 136* 145* 152*    Review of Systems:   Limited, as per HPI otherwise negative  Past Medical History:  He,  has a past medical history of Anemia, Anxiety, Arthritis, Atherosclerosis of aorta (HCC), Cardiomegaly, Chest pain, Cocaine abuse ([place]), COPD exacerbation ([place]) (08/17/2016), Coronary artery disease, Dysrhythmia, ESRD (end stage renal disease) on dialysis ([place]), GERD (gastroesophageal reflux disease), Hemorrhoids, Hepatitis B, chronic ([place]), Hepatitis C, History of kidney stones, Hyperkalemia, Hypertension, Kidney failure, Metabolic bone disease, Mitral stenosis, Myocardial infarction ([place]), Pneumonia, Pulmonary edema, Solitary rectal ulcer syndrome (07/2017), and Tubular adenoma of colon.   Surgical History:   Past Surgical History:  Procedure Laterality Date  . A/V FISTULAGRAM Left 05/26/2017   Procedure: A/V FISTULAGRAM;  Surgeon: Conrad Blanket, MD;  Location: [place] [facility];  Service: Cardiovascular;  Laterality: Left;  . A/V FISTULAGRAM Right 11/18/2017   Procedure: A/V FISTULAGRAM - Right Arm;  Surgeon: Ruta Hinds  E, MD;  Location: [place] [facility];  Service: Cardiovascular;  Laterality: Right;  . AMPUTATION Right 09/04/2020   Procedure: RIGHT INDEX AND RIGHT RING FINGER  AMPUTATION DIGIT;  Surgeon: Leanora Cover, MD;  Location: [place];  Service: Orthopedics;  Laterality: Right;  . APPLICATION OF WOUND VAC Left 06/14/2017   Procedure: APPLICATION OF WOUND VAC;  Surgeon: Katha Cabal, MD;  Location: [facility];  Service: Vascular;  Laterality: Left;  . AV FISTULA PLACEMENT  2012   BELIEVED WAS PLACED IN JUNE  . AV FISTULA PLACEMENT Right 08/09/2017   Procedure: Creation Right arm ARTERIOVENOUS BRACHIOCEPOHALIC FISTULA;  Surgeon: Elam Dutch, MD;  Location: [facility];  Service: Vascular;  Laterality: Right;  . AV FISTULA PLACEMENT Right 11/22/2017   Procedure: INSERTION OF ARTERIOVENOUS (AV) GORE-TEX GRAFT RIGHT UPPER ARM;  Surgeon: Elam Dutch, MD;  Location: [place];  Service: Vascular;  Laterality: Right;  . BIOPSY  01/25/2018   Procedure: BIOPSY;  Surgeon: Jerene Bears, MD;  Location: [place];  Service: Gastroenterology;;  . BIOPSY  04/10/2019   Procedure: BIOPSY;  Surgeon: Jerene Bears, MD;  Location: [facility];  Service: Gastroenterology;;  . COLONOSCOPY    . COLONOSCOPY WITH PROPOFOL N/A 01/25/2018   Procedure: COLONOSCOPY WITH PROPOFOL;  Surgeon: Jerene Bears, MD;  Location: [place];  Service: Gastroenterology;  Laterality: N/A;  . CORONARY STENT INTERVENTION N/A 02/22/2017   Procedure: CORONARY STENT INTERVENTION;  Surgeon: Nigel Mormon, MD;  Location: [place] [facility];  Service: Cardiovascular;  Laterality: N/A;  . ESOPHAGOGASTRODUODENOSCOPY (EGD) WITH PROPOFOL N/A 01/25/2018   Procedure: ESOPHAGOGASTRODUODENOSCOPY (EGD) WITH PROPOFOL;  Surgeon: Jerene Bears, MD;  Location: [place];  Service: Gastroenterology;  Laterality: N/A;  . ESOPHAGOGASTRODUODENOSCOPY (EGD) WITH PROPOFOL N/A 04/10/2019   Procedure: ESOPHAGOGASTRODUODENOSCOPY (EGD) WITH PROPOFOL;  Surgeon: Jerene Bears, MD;  Location: [facility];  Service: Gastroenterology;  Laterality: N/A;  . FLEXIBLE SIGMOIDOSCOPY N/A 07/15/2017   Procedure: FLEXIBLE  SIGMOIDOSCOPY;  Surgeon: Carol Ada, MD;  Location: [place];  Service: Endoscopy;  Laterality: N/A;  . HEMORRHOID BANDING    . I & D EXTREMITY Left 06/01/2017   Procedure: IRRIGATION AND DEBRIDEMENT LEFT ARM HEMATOMA WITH LIGATION OF LEFT ARM AV FISTULA;  Surgeon: Elam Dutch, MD;  Location: [place];  Service: Vascular;  Laterality: Left;  . I & D EXTREMITY Left 06/14/2017   Procedure: IRRIGATION AND DEBRIDEMENT EXTREMITY;  Surgeon: Katha Cabal, MD;  Location: [facility];  Service: Vascular;  Laterality: Left;  . INSERTION OF DIALYSIS CATHETER  05/30/2017  . INSERTION OF DIALYSIS CATHETER N/A 05/30/2017   Procedure: INSERTION OF DIALYSIS CATHETER;  Surgeon: Elam Dutch, MD;  Location: [place];  Service: Vascular;  Laterality: N/A;  . IR PARACENTESIS  08/30/2017  . IR PARACENTESIS  09/29/2017  . IR PARACENTESIS  10/28/2017  . IR PARACENTESIS  11/09/2017  . IR PARACENTESIS  11/16/2017  . IR PARACENTESIS  11/28/2017  . IR PARACENTESIS  12/01/2017  . IR PARACENTESIS  12/06/2017  . IR PARACENTESIS  01/03/2018  . IR PARACENTESIS  01/23/2018  . IR PARACENTESIS  02/07/2018  . IR PARACENTESIS  02/21/2018  . IR PARACENTESIS  03/06/2018  . IR PARACENTESIS  03/17/2018  . IR PARACENTESIS  04/04/2018  . IR PARACENTESIS  12/28/2018  . IR PARACENTESIS  01/08/2019  . IR PARACENTESIS  01/23/2019  . IR PARACENTESIS  02/01/2019  . IR PARACENTESIS  02/19/2019  . IR PARACENTESIS  03/01/2019  .  IR PARACENTESIS  03/15/2019  . IR PARACENTESIS  04/03/2019  . IR PARACENTESIS  04/12/2019  . IR PARACENTESIS  05/01/2019  . IR PARACENTESIS  05/08/2019  . IR PARACENTESIS  05/24/2019  . IR PARACENTESIS  06/12/2019  . IR PARACENTESIS  07/09/2019  . IR PARACENTESIS  07/27/2019  . IR PARACENTESIS  08/09/2019  . IR PARACENTESIS  08/21/2019  . IR PARACENTESIS  09/17/2019  . IR PARACENTESIS  10/05/2019  . IR PARACENTESIS  10/29/2019  . IR PARACENTESIS  11/08/2019  . IR PARACENTESIS  12/12/2019  . IR PARACENTESIS  01/03/2020  .  IR PARACENTESIS  01/10/2020  . IR PARACENTESIS  01/17/2020  . IR PARACENTESIS  01/24/2020  . IR PARACENTESIS  01/31/2020  . IR PARACENTESIS  02/07/2020  . IR PARACENTESIS  02/21/2020  . IR PARACENTESIS  05/21/2020  . IR RADIOLOGIST EVAL & MGMT  02/14/2018  . IR RADIOLOGIST EVAL & MGMT  02/22/2019  . LEFT HEART CATH AND CORONARY ANGIOGRAPHY N/A 02/22/2017   Procedure: LEFT HEART CATH AND CORONARY ANGIOGRAPHY;  Surgeon: Nigel Mormon, MD;  Location: [place] [facility];  Service: Cardiovascular;  Laterality: N/A;  . LIGATION OF ARTERIOVENOUS  FISTULA Left 12/15/6810   Procedure: Plication of Left Arm Arteriovenous Fistula;  Surgeon: Elam Dutch, MD;  Location: [place];  Service: Vascular;  Laterality: Left;  . POLYPECTOMY    . POLYPECTOMY  01/25/2018   Procedure: POLYPECTOMY;  Surgeon: Jerene Bears, MD;  Location: [place];  Service: Gastroenterology;;  . REVISON OF ARTERIOVENOUS FISTULA Left 7/51/7001   Procedure: PLICATION OF DISTAL ANEURYSMAL SEGEMENT OF LEFT UPPER ARM ARTERIOVENOUS FISTULA;  Surgeon: Elam Dutch, MD;  Location: [place];  Service: Vascular;  Laterality: Left;  . REVISON OF ARTERIOVENOUS FISTULA Left 7/49/4496   Procedure: Plication of Left Upper Arm Fistula ;  Surgeon: Waynetta Sandy, MD;  Location: [place];  Service: Vascular;  Laterality: Left;  . SKIN GRAFT SPLIT THICKNESS LEG / FOOT Left    SKIN GRAFT SPLIT THICKNESS LEFT ARM DONOR SITE: LEFT ANTERIOR THIGH  . SKIN SPLIT GRAFT Left 07/04/2017   Procedure: SKIN GRAFT SPLIT THICKNESS LEFT ARM DONOR SITE: LEFT ANTERIOR THIGH;  Surgeon: Elam Dutch, MD;  Location: [place];  Service: Vascular;  Laterality: Left;  . THROMBECTOMY W/ EMBOLECTOMY Left 06/05/2017   Procedure: EXPLORATION OF LEFT ARM FOR BLEEDING; OVERSEWED PROXIMAL FISTULA;  Surgeon: Angelia Mould, MD;  Location: [place];  Service: Vascular;  Laterality: Left;  . WOUND EXPLORATION Left 06/03/2017   Procedure: WOUND EXPLORATION WITH WOUND  VAC APPLICATION TO LEFT ARM;  Surgeon: Angelia Mould, MD;  Location: [place];  Service: Vascular;  Laterality: Left;     Social History:   reports that he has been smoking cigarettes. He started smoking about 47 years ago. He has a 43.00 pack-year smoking history. He has never used smokeless tobacco. He reports previous alcohol use. He reports current drug use. Drugs: Marijuana and Cocaine.   Family History:  His family history includes COPD in his father and another family member; Esophageal cancer in his sister; Heart disease in his father and mother; Hypertension in an other family member; Lung cancer in his mother; Malignant hyperthermia in his father; Throat cancer in his sister and sister. There is no history of Colon cancer, Colon polyps, Rectal cancer, or Stomach cancer.   Allergies Allergies  Allergen Reactions  . Tramadol Itching and Other (See Comments)    Other reaction(s): Unknown  .  Grass Extracts [Gramineae Pollens]     Other reaction(s): Sneezing  . Morphine And Related Other (See Comments)    Stomach pain  . Pollen Extract Other (See Comments)    Other reaction(s): Sneezing (finding)  . Acetaminophen Nausea Only    Stomach ache   . Aspirin Itching and Other (See Comments)    STOMACH PAIN  Other reaction(s): Unknown  . Clonidine Derivatives Itching     Home Medications  Prior to Admission medications   Medication Sig Start Date End Date Taking? Authorizing Provider  albuterol (VENTOLIN HFA) 108 (90 Base) MCG/ACT inhaler Inhale 2 puffs into the lungs every 4 (four) hours as needed for wheezing or shortness of breath. 07/10/20   Charlott Rakes, MD  amiodarone (PACERONE) 200 MG tablet Take 1 tablet (200 mg total) by mouth daily. 07/22/20   Cantwell, Celeste C, PA-C  cetirizine (ZYRTEC) 10 MG tablet Take 10 mg by mouth daily as needed. 06/23/20   [provider]  ciclopirox (PENLAC) 8 % solution Apply topically at bedtime. For 8 weeks Patient not  taking: No sig reported 07/16/20   Charlott Rakes, MD  diltiazem (CARDIZEM CD) 120 MG 24 hr capsule TAKE 1 CAPSULE BY MOUTH EVERY DAY Patient taking differently: Take 120 mg by mouth daily. 06/11/20   Adrian Prows, MD  diphenhydrAMINE (BENADRYL) 25 mg capsule Take 1 capsule (25 mg total) by mouth every 6 (six) hours as needed for itching or allergies. Patient not taking: Reported on 09/03/2020 01/29/20   Shelly Coss, MD  gabapentin (NEURONTIN) 300 MG capsule Take 1 capsule (300 mg total) by mouth in the morning and at bedtime. 06/26/20   Charlott Rakes, MD  metoprolol succinate (TOPROL XL) 25 MG 24 hr tablet Take 1 tablet (25 mg total) by mouth daily. 07/22/20   Cantwell, Celeste C, PA-C  ondansetron (ZOFRAN) 4 MG tablet TAKE 1 TABLET BY MOUTH EVERY 8 HOURS AS NEEDED FOR NAUSEA AND VOMITING Patient taking differently: Take 4 mg by mouth every 8 (eight) hours as needed for nausea or vomiting. 08/29/20   Charlott Rakes, MD  oxyCODONE (ROXICODONE) 5 MG immediate release tablet 1 tab PO q6 hours prn pain 09/04/20   Leanora Cover, MD  Oxycodone HCl 10 MG TABS Take 10 mg by mouth 3 (three) times daily as needed (pain).  01/24/20   [provider]  pantoprazole (PROTONIX) 40 MG tablet Take 1 tablet (40 mg total) by mouth 2 (two) times daily. 03/25/20   Cantwell, Celeste C, PA-C  sevelamer carbonate (RENVELA) 800 MG tablet Take 2,400 mg by mouth See admin instructions. Take 3 tablets (2400 mg) by mouth up to three times daily with meals    [provider]  tenofovir (VIREAD) 300 MG tablet Take 1 by mouth every Monday. Patient not taking: No sig reported 05/22/20   Delora Fuel, MD      This patient is critically ill with multiple organ system failure; which, requires frequent high complexity decision making, assessment, support, evaluation, and titration of therapies. This was completed through the application of advanced monitoring technologies and extensive interpretation of multiple  databases. During this encounter critical care time was devoted to patient care services described in this note for 32 minutes.  Garner Nash, DO [facility] 09/09/2020 8:18 AM

## 2020-09-10 DIAGNOSIS — B181 Chronic viral hepatitis B without delta-agent: Secondary | ICD-10-CM

## 2020-09-10 DIAGNOSIS — T391X4A Poisoning by 4-Aminophenol derivatives, undetermined, initial encounter: Secondary | ICD-10-CM | POA: Diagnosis not present

## 2020-09-10 LAB — CBC
Hemoglobin: 10.6 g/dL — ABNORMAL LOW (ref 13.0–17.0)
Platelets: 162 10*3/uL (ref 150–400)
WBC: 13.7 10*3/uL — ABNORMAL HIGH (ref 4.0–10.5)

## 2020-09-10 LAB — RENAL FUNCTION PANEL
Albumin: 3.3 g/dL — ABNORMAL LOW (ref 3.5–5.0)
Anion gap: 24 — ABNORMAL HIGH (ref 5–15)
BUN: 19 mg/dL (ref 6–20)
CO2: 22 mmol/L (ref 22–32)
Calcium: 10.4 mg/dL — ABNORMAL HIGH (ref 8.9–10.3)
Chloride: 91 mmol/L — ABNORMAL LOW (ref 98–111)
Creatinine, Ser: 6.17 mg/dL — ABNORMAL HIGH (ref 0.61–1.24)
GFR, Estimated: 10 mL/min — ABNORMAL LOW (ref >60)
Glucose, Bld: 106 mg/dL — ABNORMAL HIGH (ref 70–99)
Phosphorus: 3.2 mg/dL (ref 2.5–4.6)
Potassium: 3.6 mmol/L (ref 3.5–5.1)
Sodium: 137 mmol/L (ref 135–145)

## 2020-09-10 LAB — HEPATIC FUNCTION PANEL
ALT: 41 U/L (ref 0–44)
AST: 118 U/L — ABNORMAL HIGH (ref 15–41)
Albumin: 2.8 g/dL — ABNORMAL LOW (ref 3.5–5.0)
Alkaline Phosphatase: 190 U/L — ABNORMAL HIGH (ref 38–126)
Bilirubin, Direct: 0.2 mg/dL (ref 0.0–0.2)
Total Bilirubin: 1.1 mg/dL (ref 0.3–1.2)
Total Protein: 6.5 g/dL (ref 6.5–8.1)

## 2020-09-10 LAB — GLUCOSE, CAPILLARY
Glucose-Capillary: 143 mg/dL — ABNORMAL HIGH (ref 70–99)
Glucose-Capillary: 130 mg/dL — ABNORMAL HIGH (ref 70–99)
Glucose-Capillary: 105 mg/dL — ABNORMAL HIGH (ref 70–99)
Glucose-Capillary: 102 mg/dL — ABNORMAL HIGH (ref 70–99)
Glucose-Capillary: 119 mg/dL — ABNORMAL HIGH (ref 70–99)

## 2020-09-10 LAB — BASIC METABOLIC PANEL
Anion gap: 30 — ABNORMAL HIGH (ref 5–15)
BUN: 38 mg/dL — ABNORMAL HIGH (ref 6–20)
CO2: 17 mmol/L — ABNORMAL LOW (ref 22–32)
Calcium: 10.3 mg/dL (ref 8.9–10.3)
Chloride: 88 mmol/L — ABNORMAL LOW (ref 98–111)
Creatinine, Ser: 10.05 mg/dL — ABNORMAL HIGH (ref 0.61–1.24)
GFR, Estimated: 5 mL/min — ABNORMAL LOW (ref >60)
Glucose, Bld: 117 mg/dL — ABNORMAL HIGH (ref 70–99)
Potassium: 3.9 mmol/L (ref 3.5–5.1)
Sodium: 135 mmol/L (ref 135–145)

## 2020-09-10 LAB — PROTIME-INR
INR: 1.6 — ABNORMAL HIGH (ref 0.8–1.2)
Prothrombin Time: 18.9 seconds — ABNORMAL HIGH (ref 11.4–15.2)

## 2020-09-10 MED ORDER — RENA-VITE PO TABS
1.0000 | ORAL_TABLET | Freq: Every day | ORAL | Status: DC
  Administered 2020-09-11: 1 via ORAL
  Filled 2020-09-10: qty 1

## 2020-09-10 MED ORDER — FENTANYL CITRATE (PF) 100 MCG/2ML IJ SOLN
12.5000 ug | Freq: Once | INTRAMUSCULAR | Status: AC
  Administered 2020-09-10: 12.5 ug via INTRAVENOUS
  Filled 2020-09-10: qty 2

## 2020-09-10 MED ORDER — SODIUM CHLORIDE 0.9 % IV SOLN: 1.0000 g | INTRAVENOUS | Status: DC

## 2020-09-10 MED ORDER — NEPRO/CARBSTEADY PO LIQD
237.0000 mL | Freq: Three times a day (TID) | ORAL | Status: DC
  Administered 2020-09-10 – 2020-09-11 (×3): 237 mL via ORAL

## 2020-09-10 MED ORDER — GABAPENTIN 300 MG PO CAPS
300.0000 mg | ORAL_CAPSULE | Freq: Two times a day (BID) | ORAL | Status: DC
  Administered 2020-09-10 – 2020-09-12 (×4): 300 mg via ORAL
  Filled 2020-09-10 (×4): qty 1

## 2020-09-10 MED ORDER — SODIUM CHLORIDE 0.9 % IV SOLN
1.0000 g | INTRAVENOUS | Status: DC
  Administered 2020-09-11 – 2020-09-12 (×2): 1 g via INTRAVENOUS
  Filled 2020-09-10 (×3): qty 10

## 2020-09-10 MED ORDER — LIDOCAINE HCL (PF) 1 % IJ SOLN: 5.0000 mL | INTRAMUSCULAR | Status: DC | PRN

## 2020-09-10 MED ORDER — LIDOCAINE-PRILOCAINE 2.5-2.5 % EX CREA
1.0000 "application " | TOPICAL_CREAM | CUTANEOUS | Status: DC | PRN
  Filled 2020-09-10: qty 5

## 2020-09-10 MED ORDER — SODIUM CHLORIDE 0.9 % IV SOLN: 100.0000 mL | INTRAVENOUS | Status: DC | PRN

## 2020-09-10 MED ORDER — AMIODARONE HCL 200 MG PO TABS
200.0000 mg | ORAL_TABLET | Freq: Every day | ORAL | Status: DC
  Administered 2020-09-10 – 2020-09-12 (×3): 200 mg via ORAL
  Filled 2020-09-10 (×3): qty 1

## 2020-09-10 MED ORDER — ALTEPLASE 2 MG IJ SOLR
2.0000 mg | Freq: Once | INTRAMUSCULAR | Status: DC | PRN
  Filled 2020-09-10: qty 2

## 2020-09-10 MED ORDER — ALBUMIN HUMAN 25 % IV SOLN
50.0000 g | Freq: Once | INTRAVENOUS | Status: AC
  Administered 2020-09-10: 50 g via INTRAVENOUS
  Filled 2020-09-10: qty 200

## 2020-09-10 MED ORDER — PENTAFLUOROPROP-TETRAFLUOROETH EX AERO: 1.0000 "application " | INHALATION_SPRAY | CUTANEOUS | Status: DC | PRN

## 2020-09-10 MED ORDER — HALOPERIDOL LACTATE 5 MG/ML IJ SOLN
1.0000 mg | Freq: Four times a day (QID) | INTRAMUSCULAR | Status: DC | PRN
  Administered 2020-09-10 – 2020-09-11 (×4): 1 mg via INTRAVENOUS
  Filled 2020-09-10 (×4): qty 1

## 2020-09-10 MED ORDER — HEPARIN SODIUM (PORCINE) 1000 UNIT/ML DIALYSIS
1000.0000 [IU] | INTRAMUSCULAR | Status: DC | PRN
  Filled 2020-09-10: qty 1

## 2020-09-10 MED ORDER — HALOPERIDOL 1 MG PO TABS
1.0000 mg | ORAL_TABLET | Freq: Four times a day (QID) | ORAL | Status: DC | PRN
  Filled 2020-09-10: qty 1

## 2020-09-10 MED ORDER — HALOPERIDOL LACTATE 5 MG/ML IJ SOLN: 1.0000 mg | Freq: Four times a day (QID) | INTRAMUSCULAR | Status: DC | PRN

## 2020-09-10 MED ORDER — MIDODRINE HCL 5 MG PO TABS
5.0000 mg | ORAL_TABLET | Freq: Three times a day (TID) | ORAL | Status: DC
  Administered 2020-09-10 – 2020-09-11 (×5): 5 mg via ORAL
  Filled 2020-09-10 (×6): qty 1

## 2020-09-10 MED ORDER — LACTULOSE 10 GM/15ML PO SOLN
20.0000 g | Freq: Two times a day (BID) | ORAL | Status: DC
  Filled 2020-09-10 (×3): qty 30

## 2020-09-10 NOTE — Progress Notes (Signed)
Initial Nutrition Assessment  DOCUMENTATION CODES:   Non-severe (moderate) malnutrition in context of chronic illness  INTERVENTION:   If intake unable to progress and remain consistent, recommend Cortrak placement given malnutrition status   Nepro Shake po TID, each supplement provides 425 kcal and 19 grams protein  Renal MVI daily   NUTRITION DIAGNOSIS:   Moderate Malnutrition related to chronic illness as evidenced by mild fat depletion,severe fat depletion.  GOAL:   Patient will meet greater than or equal to 90% of their needs  MONITOR:   PO intake,Supplement acceptance,Weight trends,Labs,I & O's,Skin  REASON FOR ASSESSMENT:   Rounds    ASSESSMENT:   Patient with PMH significant for ESRD on HD, cirrhosis, COPD, CAD, and severe ischemic wounds on R hand resulting in finger amputations on 2/24. Presents this admission after missing HD resulting in hyperkalemia and EKG changes.  Pt discussed during ICU rounds and with RN.   Patient denies decreased appetite PTA. Unable to provide meal composition or answer how many meals he consumes in a day. Patient very eager to eat upon RD assessment. Diet advanced per nursing. If intake does not progress, recommend Cortrak placement.   When asked about weight status patient continued to repeat "I will lose weight now, if you don't let me eat." Records indicate patients had gradual increase over the last year. Suspect fluid status could be masking dry wt losses.   Outpatient EDW: 70 kg  Current weight: 71.2 kg   Medications: lactulose, renvela  Labs: CBG 105-143  NUTRITION - FOCUSED PHYSICAL EXAM:  Flowsheet Row Most Recent Value  Orbital Region Mild depletion  Upper Arm Region Moderate depletion  Thoracic and Lumbar Region Unable to assess  Buccal Region Unable to assess  Temple Region Mild depletion  Clavicle Bone Region Moderate depletion  Clavicle and Acromion Bone Region Moderate depletion  Scapular Bone Region  Unable to assess  Dorsal Hand Moderate depletion  Patellar Region Severe depletion  Anterior Thigh Region Severe depletion  Posterior Calf Region Severe depletion  Edema (RD Assessment) Mild  Hair Reviewed  Eyes Unable to assess  Mouth Unable to assess  Skin Reviewed  Nails Reviewed     Diet Order:   Diet Order            Diet renal/carb modified with fluid restriction Diet-HS Snack? Nothing; Fluid restriction: 1200 mL Fluid; Room service appropriate? Yes; Fluid consistency: Thin  Diet effective now                 EDUCATION NEEDS:   Not appropriate for education at this time  Skin:  Skin Assessment: Skin Integrity Issues: Skin Integrity Issues:: Incisions Incisions: R hand s/p amputations  Last BM:  3/1  Height:   Ht Readings from Last 1 Encounters:  09/07/20 5\' 9"  (1.753 m)    Weight:   Wt Readings from Last 1 Encounters:  09/10/20 71.2 kg   BMI:  Body mass index is 23.18 kg/m.  Estimated Nutritional Needs:   Kcal:  2200-2400 kcal  Protein:  110-125 grams  Fluid:  1000 ml + UOP  Mariana Single RD, LDN Clinical Nutrition Pager listed in [place]

## 2020-09-10 NOTE — Plan of Care (Signed)

## 2020-09-10 NOTE — Progress Notes (Signed)
eLink Physician-Brief Progress Note Patient Name: [name] DOB: Sep 03, 1960 MRN: [id number]   Date of Service  09/10/2020  HPI/Events of Note  Agitation - Improved with already ordered Haldol.  eICU Interventions  Plan: 1. Monitor QTc interval Q 6 hours. Notify MD if QTc interval > 0.5 seconds.      Intervention Category Major Interventions: Delirium, psychosis, severe agitation - evaluation and management  [name] 09/10/2020, 11:19 PM

## 2020-09-10 NOTE — Progress Notes (Addendum)
NAME:  [name], MRN:  [id number], DOB:  1960/11/21, LOS: 3 ADMISSION DATE:  09/07/2020, CONSULTATION DATE:  09/07/2020 REFERRING MD:  Hal Hope, CHIEF COMPLAINT:  Vtach/ hypotension  Brief History:  9 yoM ESRD on dialysis with recent hand surgery on 2/24 for ischemic wounds to several fingers presenting from home with N/V/D after taking approximately 48,000 mg of tylenol after he ran out of narcotic pain medicine; tylenol level 18, within normal range.  Patient also missed Friday dialysis, no dialysis since 2/23.  Has been hypotensive, hypoglycemic, and hyperkalemic.  Was going to get dialysis but developed wide complex tachycardia with worsening hypotension, PCCM consulted.   History of Present Illness:  HPI obtained from medical chart review as patient is limited in his cooperativeness, resolving hypoglycemia.  60 year old male with past medical history significant for but not limited to ESRD on MWF iHD, cirrhosis, chronic Afib, COPD, CAD, and recent hand surgery on 2/24 for ischemic wounds several fingers who presents with one day history of nausea, vomiting, and diarrhea.  Patient reported running out of narcotic pain medicine therefore he starting taking tylenol for pain.  Reportedly took 48,000 mg of tylenol over 48 hour period, last took 2/26.  Denied SI/ HI in ER or other coingestions.  Additionally, he missed dialysis on 2/25, last dialysis 2/23.  Denies any SOB, fever/ chills, abdominal pain, or chest pain.   In ER, BP variable and only able to be obtained on his leg.  Otherwise vitals stable, afebrile, room air, and mentating well.  He was started on NAC bolus followed by infusion.  Acetaminophen level 18, Alk phos 268, AST 285, ALT 106, t.bili 1.4, INR 1.9, lactic acid 2.6, K 6.6, bicarb 7, glucose 56, BUN 90, sCr 14, WBC 16.3, Hgb 26.3, negative salicylate level, EKG showing prolonged QTc, CXR neg. Cultures sent and started on empiric cefepime and vancomycin.  Nephrology  consulted in ER with plans for dialysis tonight, admitted to [facility].  Once in PCU, patient hypotension, hypoglycemia and wide complex rhythm.  Was chemically treated for hyperkalemia with narrowing of QRS complex, also hypotensive as well.  Received a NS 500 ml bolus.  PCCM consulted for evaluation.   Past Medical History:   Past Medical History:  Diagnosis Date  . Anemia   . Anxiety   . Arthritis    left shoulder  . Atherosclerosis of aorta ([place])   . Cardiomegaly   . Chest pain    DATE UNKNOWN, C/O PERIODICALLY  . Cocaine abuse ([place])   . COPD exacerbation ([place]) 08/17/2016  . Coronary artery disease    stent 02/22/17  . Dysrhythmia   . ESRD (end stage renal disease) on dialysis ([place])    "[name]; MWF" (07/04/2017)  . GERD (gastroesophageal reflux disease)    DATE UNKNOWN  . Hemorrhoids   . Hepatitis B, chronic ([place])   . Hepatitis C   . History of kidney stones   . Hyperkalemia   . Hypertension   . Kidney failure   . Metabolic bone disease    Patient denies  . Mitral stenosis   . Myocardial infarction ([place])   . Pneumonia   . Pulmonary edema   . Solitary rectal ulcer syndrome 07/2017   at [facility] for rectal bleeding  . Tubular adenoma of colon    Significant Hospital Events:  2/27 admitted [place]  Consults:  PCCM  Procedures:   Significant Diagnostic Tests:   Micro Data:  2/27 SARS/ flu >> neg 2/27 BCx >>  2/27 MRSA >> neg  Antimicrobials:  2/27 cefepime >> off 2/27 vanc >> off 3/2 ceftriaxone   Interim History / Subjective:   Alert following commands now. Was on levo overnight. Now off pressors.   Objective   Blood pressure 113/66, pulse (!) 107, temperature 98.7 F (37.1 C), temperature source Oral, resp. rate (!) 21, height $RemoveBe'5\' 9"'uIAodMaUN$  (1.753 m), weight 71.2 kg, SpO2 97 %.        Intake/Output Summary (Last 24 hours) at 09/10/2020 1049 Last data filed at 09/10/2020 0700 Gross per 24 hour  Intake 1284.41 ml  Output --  Net 1284.41 ml   Filed Weights    09/08/20 0500 09/09/20 0104 09/10/20 0600  Weight: 70.9 kg 71.3 kg 71.2 kg    Examination: General: Frail debilitated male, chronically ill-appearing HEENT: Mucous membranes moist tracking appropriately, NCAT Neuro: Alert to voice follows commands moves all 4 extremities CV: Regular rate rhythm, S1-S2 PULM: Clear to auscultation bilaterally, no wheeze GI: Soft, nontender nondistended Extremities: No significant edema, right palpable AV fistula Skin: No rash  Resolved Hospital Problem list    Assessment & Plan:   ESRD, missed iHD PTA Hyperkalemia with EKG changes Hypotension  Shock, hypovolemia - hypotension is from volume depletion  - he was dialyzed and had 3L from paracentesis removed.  - we will give him some albumin  - start oral midodrine  - off NEPI   Leukocytosis Recent right hand surgery  Plan:  De-escalate abx to ceftriaxone to complete 7 days  WOC consult for hand  Hypoglycemia, likely related to stress/ cirrhosis/ N/V Plan: On d10  Poor po intake at this time  May need a cortrack   Acute metabolic encephalopathy secondary to above P: Resolved   Tylenol overdose H/o polysubstance abuse  Transaminitis  Elevated INR  - tylenol level 18 which is in normal range, reported last dose 2/26,  ?questionable if actually took 48,000 mg of tylenol, levels do no match to nomogram  - denied SI/ HI in ER.  He has a pain management doctor per Ortho note P:  NAC stopped   Right index and ring finger ischemia and necrosis s/p amputation 2/24 - ortho f/u in 1 week from surgery  Hx Afib with RVR (not AC candidate given noncompliance) - transition to amiodarone PO  Anemia of chronic disease - Hgb stable, trend  - ESA per nephrology  P: Observe   Hx Cirrhosis/ chronic hep B with frequent paracentesis Recurrent Ascites  - s/p paracentesis 3L removed   Hx reported COPD - prn nebs     Best practice (evaluated daily)  Diet: NPO Pain/Anxiety/Delirium  protocol (if indicated): n/a VAP protocol (if indicated): n/a DVT prophylaxis: SCDs GI prophylaxis: protonix when QTc better Glucose control: CBG q 4, add SSI if > 180 Mobility: BR for now Disposition: stable for transfer to SDU under [place] as early as tomorrow morning  Goals of Care:  Last date of multidisciplinary goals of care discussion: pending Family and staff present: n/a Summary of discussion: n/a Follow up goals of care discussion due: n/a Code Status: full   Labs   CBC: Recent Labs  Lab 09/07/20 1731 09/07/20 1744 09/07/20 2340 09/08/20 0104 09/09/20 0304  WBC  --  16.3* 14.8* 15.0* 14.6*  NEUTROABS  --  14.0* 12.6*  --   --   HGB 14.6 12.7* 11.9* 11.6* 10.6*  HCT 43.0 35.6* 32.8* 31.4* 28.9*  MCV  --  85.0 82.2 82.4 80.3  PLT  --  296  317 301 352    Basic Metabolic Panel: Recent Labs  Lab 09/07/20 1744 09/07/20 2340 09/08/20 0104 09/08/20 0811 09/09/20 0304  NA 133* 135 137 137 135  K 6.6* 7.5* 6.8* 3.9 3.7  CL 88* 86* 87* 93* 89*  CO2 7* <7* <7* 15* 15*  GLUCOSE 56* 61* 91 118* 133*  BUN 90* 94* 91* 29* 32*  CREATININE 14.46* 15.01* 14.76* 6.72* 8.05*  CALCIUM 9.5 9.3 9.8 9.3 10.5*  MG  --  2.8*  --   --   --    GFR: Estimated Creatinine Clearance: 10 mL/min (A) (by C-G formula based on SCr of 8.05 mg/dL (H)). Recent Labs  Lab 09/07/20 1744 09/07/20 1850 09/07/20 2340 09/08/20 0104 09/09/20 0304  WBC 16.3*  --  14.8* 15.0* 14.6*  LATICACIDVEN 2.8* 2.6*  --   --   --     Liver Function Tests: Recent Labs  Lab 09/08/20 0104 09/08/20 0811 09/08/20 1641 09/09/20 0304 09/09/20 1747 09/10/20 0051  AST 197* 167* 177* 178*  --  118*  ALT 82* 63* 58* 60*  --  41  ALKPHOS 244* 203* 219* 216*  --  190*  BILITOT 1.0 1.4* 1.4* 1.4*  --  1.1  PROT 7.4 7.1 7.3 7.3  --  6.5  ALBUMIN 3.2* 3.3* 3.3* 3.4* 2.9* 2.8*   No results for input(s): LIPASE, AMYLASE in the last 168 hours. Recent Labs  Lab 09/08/20 0811  AMMONIA 41*    ABG     Component Value Date/Time   PHART 7.427 01/14/2020 2322   PCO2ART 46.4 01/14/2020 2322   PO2ART 64.9 (L) 01/14/2020 2322   HCO3 7.8 (L) 09/07/2020 1731   TCO2 9 (L) 09/07/2020 1731   ACIDBASEDEF 22.0 (H) 09/07/2020 1731   O2SAT 41.0 09/07/2020 1731     Coagulation Profile: Recent Labs  Lab 09/08/20 0104 09/08/20 1641 09/09/20 0304 09/09/20 1747 09/10/20 0051  INR 2.5* 3.2* 2.5* 1.8* 1.6*    Cardiac Enzymes: No results for input(s): CKTOTAL, CKMB, CKMBINDEX, TROPONINI in the last 168 hours.  HbA1C: Hgb A1c MFr Bld  Date/Time Value Ref Range Status  01/26/2020 11:46 AM 4.8 4.8 - 5.6 % Final    Comment:    (NOTE) Pre diabetes:          5.7%-6.4%  Diabetes:              >6.4%  Glycemic control for   <7.0% adults with diabetes   09/26/2019 01:07 AM 4.7 (L) 4.8 - 5.6 % Final    Comment:    (NOTE) Pre diabetes:          5.7%-6.4% Diabetes:              >6.4% Glycemic control for   <7.0% adults with diabetes     CBG: Recent Labs  Lab 09/09/20 1533 09/09/20 1939 09/09/20 2335 09/10/20 0348 09/10/20 0839  GLUCAP 139* 121* 140* 143* 130*      This patient is critically ill with multiple organ system failure; which, requires frequent high complexity decision making, assessment, support, evaluation, and titration of therapies. This was completed through the application of advanced monitoring technologies and extensive interpretation of multiple databases. During this encounter critical care time was devoted to patient care services described in this note for 32 minutes.  Garner Nash, DO [facility] 09/10/2020 10:50 AM

## 2020-09-10 NOTE — Progress Notes (Signed)
Subjective:  Agitation got to point where now on precedex -  Had 3 liter paracentesis yest- this AM on a little norepi as well   Objective Vital signs in last 24 hours: Vitals:   09/10/20 0630 09/10/20 0645 09/10/20 0700 09/10/20 0715  BP: 99/61 (!) 95/58 (!) 105/51 97/64  Pulse: (!) 39 89 67 (!) 51  Resp: 11 12 14 13   Temp:      TempSrc:      SpO2: 98% 99% 99% 98%  Weight:      Height:       Weight change: -0.1 kg  Intake/Output Summary (Last 24 hours) at 09/10/2020 6720 Last data filed at 09/10/2020 0700 Gross per 24 hour  Intake 1659.43 ml  Output -  Net 1659.43 ml   OP HD: [facility] M[facility]   4h  450/800  70kg  2/2 bath  Hep none  RUA AVG  - hect 1, sensipar 60   Assessment/ Plan: Pt is a 60 y.o. yo male with ESRD, cirrhosis, COPD, CAD who was admitted on 09/07/2020 with  missed HD and  tylenol OD in the setting of finger amputations/attempting pain control  Assessment/Plan: 1. Tylenol OD-  Acetylcysteine stopped , serial levels-  INR not normal but trending down 2. ESRD -  Notorious non compliant with HD-  Missed Friday as OP.  Hd done here early Mon AM-  Next should be due today-  Will do at bedside 3. Anemia- not a major issue at present but dropping with elevated INR 4. Secondary hyperparathyroidism- should be on sensipar with HD, hold hectorol wth high calcium- also on renvela but currently NPO so holding meds 5. HTN/volume-  BP low right now- supposed to be on metoprolol and dilt as OP-  Unclear if taking - now on amio.  Volume status seems OK-  Was getting IVF for sugar, now off.  Try for some UF with HD today as able   6. Hyperkalemia-  On admit-  emergent HD and corrected early AM Monday-  Due to non compliance with HD-  7. Confusion-  Low sugar, corrected -  ammonia level slightly high-  Supportive care.  Is angry at baseline but usually oriented.  This continues, will see what HD does for him   Louis Meckel    Labs: Basic Metabolic Panel: Recent Labs  Lab  09/08/20 0104 09/08/20 0811 09/09/20 0304  NA 137 137 135  K 6.8* 3.9 3.7  CL 87* 93* 89*  CO2 <7* 15* 15*  GLUCOSE 91 118* 133*  BUN 91* 29* 32*  CREATININE 14.76* 6.72* 8.05*  CALCIUM 9.8 9.3 10.5*   Liver Function Tests: Recent Labs  Lab 09/08/20 1641 09/09/20 0304 09/09/20 1747 09/10/20 0051  AST 177* 178*  --  118*  ALT 58* 60*  --  41  ALKPHOS 219* 216*  --  190*  BILITOT 1.4* 1.4*  --  1.1  PROT 7.3 7.3  --  6.5  ALBUMIN 3.3* 3.4* 2.9* 2.8*   No results for input(s): LIPASE, AMYLASE in the last 168 hours. Recent Labs  Lab 09/08/20 0811  AMMONIA 41*   CBC: Recent Labs  Lab 09/07/20 1744 09/07/20 2340 09/08/20 0104 09/09/20 0304  WBC 16.3* 14.8* 15.0* 14.6*  NEUTROABS 14.0* 12.6*  --   --   HGB 12.7* 11.9* 11.6* 10.6*  HCT 35.6* 32.8* 31.4* 28.9*  MCV 85.0 82.2 82.4 80.3  PLT 296 317 301 189   Cardiac Enzymes: No results for input(s): CKTOTAL, CKMB,  CKMBINDEX, TROPONINI in the last 168 hours. CBG: Recent Labs  Lab 09/09/20 1119 09/09/20 1533 09/09/20 1939 09/09/20 2335 09/10/20 0348  GLUCAP 168* 139* 121* 140* 143*    Iron Studies: No results for input(s): IRON, TIBC, TRANSFERRIN, FERRITIN in the last 72 hours. Studies/Results: No results found. Medications: Infusions: . sodium chloride    . sodium chloride    . amiodarone 30 mg/hr (09/10/20 0700)  . ceFEPime (MAXIPIME) IV Stopped (09/10/20 2924)  . dexmedetomidine (PRECEDEX) IV infusion 0.4 mcg/kg/hr (09/10/20 0700)  . dextrose Stopped (09/09/20 1210)  . norepinephrine (LEVOPHED) Adult infusion 2 mcg/min (09/10/20 0700)  . sodium chloride      Scheduled Medications: . sodium chloride   Intravenous Once  . Chlorhexidine Gluconate Cloth  6 each Topical Q0600  . mouth rinse  15 mL Mouth Rinse BID  . sevelamer carbonate  3,200 mg Oral TID with meals    have reviewed scheduled and prn medications.  Physical Exam: General: somnolent on precedex, arouses but not really answering  specific questions Heart: irreg Lungs: poor effort Abdomen: soft, non tender Extremities: min peripheral edema Dialysis Access: right upper arm AVG-  Bandaged-  Bruit heard    09/10/2020,7:22 AM  LOS: 3 days

## 2020-09-10 NOTE — Progress Notes (Signed)
eLink Physician-Brief Progress Note Patient Name: [name] DOB: [birth year] MRN: [id number]   Date of Service  09/10/2020  HPI/Events of Note  Patient c/o pain in hand with amputated finger. Patient has multiple allergies: Tramadol, Morphine, Acetaminophen and Aspirin.   eICU Interventions  Plan: 1. Restart home Neurontin 300 mg PO BID.     Intervention Category Major Interventions: Other:  Lysle Dingwall 09/10/2020, 8:17 PM

## 2020-09-10 NOTE — Consult Note (Signed)
[place] Nurse Consult Note: Patient receiving care in [place]. Reason for Consult: "finger amputation dressings" Wound type: surgical--by Dr. Leanora Cover on 2/24.  I have reached out via Secure Chat to let Dr. Fredna Dow, Orthopedic surgeon, know about his patient's admission and asking if he will be seeing the patient for the amputation site orders.  Awaiting response. Val Riles, RN, MSN, CWOCN, CNS-BC, pager [phone]

## 2020-09-10 NOTE — Consult Note (Signed)
WOC Nurse Consult Note: Per Arma Heading Secure Chat message, dry dressings prn to the finger amputation sites is what the operative surgeon wanted.  I have entered this as a daily order. Val Riles, RN, MSN, CWOCN, CNS-BC, pager [phone]

## 2020-09-10 NOTE — Plan of Care (Signed)
  Problem: Health Behavior/Discharge Planning: Goal: Ability to manage health-related needs will improve Outcome: Not Progressing   Problem: Clinical Measurements: Goal: Will remain free from infection Outcome: Progressing Goal: Respiratory complications will improve Outcome: Progressing Goal: Cardiovascular complication will be avoided Outcome: Progressing   Problem: Activity: Goal: Risk for activity intolerance will decrease Outcome: Progressing   Problem: Nutrition: Goal: Adequate nutrition will be maintained Outcome: Progressing   Problem: Coping: Goal: Level of anxiety will decrease Outcome: Not Progressing

## 2020-09-11 DIAGNOSIS — N186 End stage renal disease: Secondary | ICD-10-CM | POA: Diagnosis not present

## 2020-09-11 DIAGNOSIS — G9341 Metabolic encephalopathy: Secondary | ICD-10-CM

## 2020-09-11 DIAGNOSIS — K7031 Alcoholic cirrhosis of liver with ascites: Secondary | ICD-10-CM

## 2020-09-11 DIAGNOSIS — J449 Chronic obstructive pulmonary disease, unspecified: Secondary | ICD-10-CM

## 2020-09-11 DIAGNOSIS — D72829 Elevated white blood cell count, unspecified: Secondary | ICD-10-CM

## 2020-09-11 DIAGNOSIS — Z9119 Patient's noncompliance with other medical treatment and regimen: Secondary | ICD-10-CM

## 2020-09-11 DIAGNOSIS — T391X1A Poisoning by 4-Aminophenol derivatives, accidental (unintentional), initial encounter: Secondary | ICD-10-CM | POA: Diagnosis not present

## 2020-09-11 DIAGNOSIS — I4891 Unspecified atrial fibrillation: Secondary | ICD-10-CM

## 2020-09-11 DIAGNOSIS — B181 Chronic viral hepatitis B without delta-agent: Secondary | ICD-10-CM | POA: Diagnosis not present

## 2020-09-11 DIAGNOSIS — E875 Hyperkalemia: Secondary | ICD-10-CM

## 2020-09-11 LAB — BASIC METABOLIC PANEL
Anion gap: 21 — ABNORMAL HIGH (ref 5–15)
BUN: 20 mg/dL (ref 6–20)
CO2: 22 mmol/L (ref 22–32)
Calcium: 9.9 mg/dL (ref 8.9–10.3)
Chloride: 92 mmol/L — ABNORMAL LOW (ref 98–111)
Creatinine, Ser: 6.68 mg/dL — ABNORMAL HIGH (ref 0.61–1.24)
GFR, Estimated: 9 mL/min — ABNORMAL LOW (ref >60)
Glucose, Bld: 127 mg/dL — ABNORMAL HIGH (ref 70–99)
Potassium: 3.2 mmol/L — ABNORMAL LOW (ref 3.5–5.1)
Sodium: 135 mmol/L (ref 135–145)

## 2020-09-11 LAB — HEPATIC FUNCTION PANEL
ALT: 28 U/L (ref 0–44)
AST: 81 U/L — ABNORMAL HIGH (ref 15–41)
Albumin: 3.3 g/dL — ABNORMAL LOW (ref 3.5–5.0)
Alkaline Phosphatase: 199 U/L — ABNORMAL HIGH (ref 38–126)
Bilirubin, Direct: 0.4 mg/dL — ABNORMAL HIGH (ref 0.0–0.2)
Indirect Bilirubin: 0.9 mg/dL (ref 0.3–0.9)
Total Bilirubin: 1.3 mg/dL — ABNORMAL HIGH (ref 0.3–1.2)
Total Protein: 6.8 g/dL (ref 6.5–8.1)

## 2020-09-11 LAB — GLUCOSE, CAPILLARY
Glucose-Capillary: 133 mg/dL — ABNORMAL HIGH (ref 70–99)
Glucose-Capillary: 129 mg/dL — ABNORMAL HIGH (ref 70–99)
Glucose-Capillary: 122 mg/dL — ABNORMAL HIGH (ref 70–99)
Glucose-Capillary: 112 mg/dL — ABNORMAL HIGH (ref 70–99)
Glucose-Capillary: 130 mg/dL — ABNORMAL HIGH (ref 70–99)
Glucose-Capillary: 98 mg/dL (ref 70–99)

## 2020-09-11 LAB — CBC
HCT: 25.7 % — ABNORMAL LOW (ref 39.0–52.0)
Hemoglobin: 10.2 g/dL — ABNORMAL LOW (ref 13.0–17.0)
MCH: 30.4 pg (ref 26.0–34.0)
MCHC: 39.7 g/dL — ABNORMAL HIGH (ref 30.0–36.0)
MCV: 76.7 fL — ABNORMAL LOW (ref 80.0–100.0)
Platelets: 140 10*3/uL — ABNORMAL LOW (ref 150–400)
RBC: 3.35 MIL/uL — ABNORMAL LOW (ref 4.22–5.81)
RDW: 15.6 % — ABNORMAL HIGH (ref 11.5–15.5)
WBC: 10 10*3/uL (ref 4.0–10.5)
nRBC: 0.9 % — ABNORMAL HIGH (ref 0.0–0.2)

## 2020-09-11 LAB — PROTIME-INR
INR: 1.3 — ABNORMAL HIGH (ref 0.8–1.2)
Prothrombin Time: 15.9 seconds — ABNORMAL HIGH (ref 11.4–15.2)

## 2020-09-11 LAB — CYTOLOGY - NON PAP

## 2020-09-11 MED ORDER — FENTANYL CITRATE (PF) 100 MCG/2ML IJ SOLN
12.5000 ug | Freq: Once | INTRAMUSCULAR | Status: AC
  Administered 2020-09-11: 12.5 ug via INTRAVENOUS
  Filled 2020-09-11: qty 2

## 2020-09-11 MED ORDER — POTASSIUM CHLORIDE CRYS ER 20 MEQ PO TBCR
40.0000 meq | EXTENDED_RELEASE_TABLET | Freq: Once | ORAL | Status: AC
  Administered 2020-09-11: 40 meq via ORAL
  Filled 2020-09-11: qty 2

## 2020-09-11 MED ORDER — CHLORHEXIDINE GLUCONATE CLOTH 2 % EX PADS: 6.0000 | MEDICATED_PAD | Freq: Every day | CUTANEOUS | Status: DC

## 2020-09-11 MED ORDER — METOPROLOL TARTRATE 5 MG/5ML IV SOLN: 2.5000 mg | INTRAVENOUS | Status: DC | PRN

## 2020-09-11 MED ORDER — FENTANYL CITRATE (PF) 100 MCG/2ML IJ SOLN
12.5000 ug | Freq: Once | INTRAMUSCULAR | Status: AC | PRN
Start: 2020-09-11 — End: 2020-09-11
  Administered 2020-09-11: 12.5 ug via INTRAVENOUS
  Filled 2020-09-11: qty 2

## 2020-09-11 MED ORDER — METOPROLOL TARTRATE 5 MG/5ML IV SOLN: 2.5000 mg | Freq: Once | INTRAVENOUS | Status: DC

## 2020-09-11 MED ORDER — CINACALCET HCL 30 MG PO TABS
60.0000 mg | ORAL_TABLET | ORAL | Status: DC
  Administered 2020-09-12: 60 mg via ORAL
  Filled 2020-09-11: qty 2

## 2020-09-11 NOTE — Progress Notes (Signed)
PROGRESS NOTE    [name]  EGB:[id number] DOB: 31-Jul-1960 DOA: 09/07/2020 PCP: Charlott Rakes, MD    Chief Complaint  Patient presents with  . Drug Overdose    Brief Narrative:  H/o ESRD on HD M/W/F, CAD s/p stenting,PAF with RVR,  pulmonary HTN, COPD, tobacco use, polysubstance abuse ( cocaine/THC) Hep B/C, Cirrhosis with recurrent ascites, , anemia of chronic disease who had recent amputation of 2 fingers of his right  hand(2/24 by Fredna Dow) presents to the ER on 2/27 after patient had taken increased dose of Tylenol which may be amounting to around 48,000 g. Patient also has been having increasing pain and had ran out of his narcotics.  He had missed his dialysis  (last dialysis prior to admission was on 2/23).   He was started on NAC bolus followed by infusion he was hypotensive, hypoglycemic, and hyperkalemic with EKG changes  on presentation,  Was going to get dialysis but developed wide complex tachycardia with worsening hypotension, PCCM consulted.   Subjective:  No agitation or confusion today, he is alert oriented x3, he wonders when he can have right hand dressing changed  He refused lactulose and Renvela  Assessment & Plan:   Principal Problem:   Tylenol overdose Active Problems:   Chronic hepatitis B (HCC)   COPD GOLD 0 with flattening on inps f/v    Essential hypertension   Hyperkalemia   ESRD on dialysis ([facility])   Leukocytosis   Acute metabolic encephalopathy present on admission likely multifactorial due to missing dialysis, electrolyte abnormality, hypoglycemic, hypotensive,+/-tylenol over dose Resolved   Possible Tylenol overdose Finished NAC infusion, monitor Tylenol level 18 on presentation Though he appears to have Tylenol overdose in April 2021 with Tylenol level at 56 at a time He denies suicidal ideation, psychiatry consulted  Shock requiring Levophed briefly Shock was thought due to hypovolemia, he did have leukocytosis on presentation,  is treated with antibiotic for possible finger infection -Blood culture no growth -Chest x-ray on presentation no acute findings -Peritoneal fluids neutrophil count 125 ( 465x27%), peritoneal fluids Gram stain no organism seen, culture no growth for 2 days -He was treated with vancomycin and cefepime on presentation, antibiotic changed to Rocephin, leukocytosis improving, continue Rocephin  History of hypertension on metoprolol and Cardizem, now hypotension on midodrine  Right index and ring finger ischemia and necrosis s/p amputation 2/24 -on rocephin for possible finger infection  will consult Dr. Fredna Dow   ESRD, missed iHD PTA, Hyperkalemia with EKG changes Known history of noncompliance with HD Management per nephrology  Hx Afib with RVR (not AC candidate given noncompliance) - On metoprolol, Cardizem and amiodarone at home  -Now off metoprolol and Cardizem due to hypotension  -Resume amiodarone PO  Hx Cirrhosis/ chronic hep B/C h/o alcohol use  Recurrent Ascites with frequent paracentesis ( used to be weekly) - s/p paracentesis 3L removed   Chronic Hep B Appear to be on tenofovir weekly  Hepc  Appear has cleared since 2018  Nutritional Assessment: The patient's BMI is: Body mass index is 23.15 kg/m.Marland Kitchen Seen by dietician.  I agree with the assessment and plan as outlined below:  Nutrition Status: Nutrition Problem: Moderate Malnutrition Etiology: chronic illness Signs/Symptoms: mild fat depletion,severe fat depletion Interventions: Nepro shake  .     Unresulted Labs (From admission, onward)          Start     Ordered   09/11/20 3710  Basic metabolic panel  Daily,   R  Question:  Specimen collection method  Answer:  Lab=Lab collect   09/10/20 0160   09/10/20 1102  CBC  Daily,   R     Question:  Specimen collection method  Answer:  Lab=Lab collect   09/10/20 1101   09/09/20 0500  Hepatic function panel  Daily,   R     Question:  Specimen collection method   Answer:  Lab=Lab collect   09/08/20 1816   09/09/20 0500  Protime-INR  Daily,   R     Question:  Specimen collection method  Answer:  Lab=Lab collect   09/08/20 1816   Signed and Held  Renal function panel  Once,   R       Question:  Specimen collection method  Answer:  Lab=Lab collect   Signed and Held   Signed and Held  CBC  Once,   R       Question:  Specimen collection method  Answer:  Lab=Lab collect   Signed and Held            DVT prophylaxis: SCDs Start: 09/07/20 2102   Code Status: Full, although per chart review he was seen by palliative care on multiple occasions, at that time he was DNR, will clarify with patient tomorrow Family Communication: Patient Disposition:   Status is: Inpatient  Dispo: The patient is from: Home by himself              Anticipated d/c is to: States he plans to live with a friend after discharge              Anticipated d/c date is: To be determined                Consultants:   Critical care  Nephrology  Psychiatry  Procedures:   Hemodialysis  Antimicrobials:   2/27 cefepime >> off 2/27 vanc >> off 3/2 ceftriaxone      Objective: Vitals:   09/11/20 0800 09/11/20 1606 09/11/20 1736 09/11/20 2005  BP:  (!) 126/108 104/63 111/89  Pulse: 86 (!) 117 88 (!) 101  Resp: 13 14 16 13   Temp: 98.5 F (36.9 C) 98.2 F (36.8 C)  97.8 F (36.6 C)  TempSrc: Oral Oral  Oral  SpO2: 94% 100% 93% 93%  Weight:      Height:        Intake/Output Summary (Last 24 hours) at 09/11/2020 2220 Last data filed at 09/11/2020 1800 Gross per 24 hour  Intake 1017 ml  Output --  Net 1017 ml   Filed Weights   09/09/20 0104 09/10/20 0600 09/11/20 0500  Weight: 71.3 kg 71.2 kg 71.1 kg    Examination:  General exam: calm, NAD Respiratory system: Clear to auscultation. Respiratory effort normal. Cardiovascular system: S1 & S2 heard, RRR. No JVD, no murmur, No pedal edema. Gastrointestinal system: Abdomen is nondistended, soft and  nontender. Normal bowel sounds heard. Central nervous system: Alert and oriented. No focal neurological deficits. Extremities: Right hand finger with dressing in place Skin: No rashes, lesions or ulcers Psychiatry: Appear to be impulsive, does not engage in conversation consistently    Data Reviewed: I have personally reviewed following labs and imaging studies  CBC: Recent Labs  Lab 09/07/20 1744 09/07/20 2340 09/08/20 0104 09/09/20 0304 09/10/20 1100 09/11/20 0047  WBC 16.3* 14.8* 15.0* 14.6* 13.7* 10.0  NEUTROABS 14.0* 12.6*  --   --   --   --   HGB 12.7* 11.9* 11.6* 10.6* 10.6* 10.2*  HCT  35.6* 32.8* 31.4* 28.9* RESULTS UNAVAILABLE DUE TO INTERFERING SUBSTANCE 25.7*  MCV 85.0 82.2 82.4 80.3 RESULTS UNAVAILABLE DUE TO INTERFERING SUBSTANCE 76.7*  PLT 296 317 301 189 162 140*    Basic Metabolic Panel: Recent Labs  Lab 09/07/20 2340 09/08/20 0104 09/08/20 0811 09/09/20 0304 09/10/20 1100 09/10/20 1559 09/11/20 0047  NA 135   < > 137 135 135 137 135  K 7.5*   < > 3.9 3.7 3.9 3.6 3.2*  CL 86*   < > 93* 89* 88* 91* 92*  CO2 <7*   < > 15* 15* 17* 22 22  GLUCOSE 61*   < > 118* 133* 117* 106* 127*  BUN 94*   < > 29* 32* 38* 19 20  CREATININE 15.01*   < > 6.72* 8.05* 10.05* 6.17* 6.68*  CALCIUM 9.3   < > 9.3 10.5* 10.3 10.4* 9.9  MG 2.8*  --   --   --   --   --   --   PHOS  --   --   --   --   --  3.2  --    < > = values in this interval not displayed.    GFR: Estimated Creatinine Clearance: 12.1 mL/min (A) (by C-G formula based on SCr of 6.68 mg/dL (H)).  Liver Function Tests: Recent Labs  Lab 09/08/20 0811 09/08/20 1641 09/09/20 0304 09/09/20 1747 09/10/20 0051 09/10/20 1559 09/11/20 0047  AST 167* 177* 178*  --  118*  --  81*  ALT 63* 58* 60*  --  41  --  28  ALKPHOS 203* 219* 216*  --  190*  --  199*  BILITOT 1.4* 1.4* 1.4*  --  1.1  --  1.3*  PROT 7.1 7.3 7.3  --  6.5  --  6.8  ALBUMIN 3.3* 3.3* 3.4* 2.9* 2.8* 3.3* 3.3*    CBG: Recent Labs  Lab  09/11/20 0305 09/11/20 0753 09/11/20 1135 09/11/20 1604 09/11/20 2135  GLUCAP 129* 122* 112* 130* 98     Recent Results (from the past 240 hour(s))  SARS CORONAVIRUS 2 (TAT 6-24 HRS) Nasopharyngeal Nasopharyngeal Swab     Status: None   Collection Time: 09/03/20  9:56 AM   Specimen: Nasopharyngeal Swab  Result Value Ref Range Status   SARS Coronavirus 2 NEGATIVE NEGATIVE Final    Comment: (NOTE) SARS-CoV-2 target nucleic acids are NOT DETECTED.  The SARS-CoV-2 RNA is generally detectable in upper and lower respiratory specimens during the acute phase of infection. Negative results do not preclude SARS-CoV-2 infection, do not rule out co-infections with other pathogens, and should not be used as the sole basis for treatment or other patient management decisions. Negative results must be combined with clinical observations, patient history, and epidemiological information. The expected result is Negative.  Fact Sheet for Patients: SugarRoll.be  Fact Sheet for Healthcare Providers: https://www.woods-mathews.com/  This test is not yet approved or cleared by the [place] FDA and  has been authorized for detection and/or diagnosis of SARS-CoV-2 by FDA under an Emergency Use Authorization (EUA). This EUA will remain  in effect (meaning this test can be used) for the duration of the COVID-19 declaration under Se ction 564(b)(1) of the Act, 21 U.S.C. section 360bbb-3(b)(1), unless the authorization is terminated or revoked sooner.  Performed at [facility], [place] [address] [place], [place] [address]   Resp Panel by RT-PCR (Flu A&B, Covid) Nasopharyngeal Swab     Status: None   Collection  Time: 09/07/20  6:35 PM   Specimen: Nasopharyngeal Swab; Nasopharyngeal(NP) swabs in vial transport medium  Result Value Ref Range Status   SARS Coronavirus 2 by RT PCR NEGATIVE NEGATIVE Final    Comment: (NOTE) SARS-CoV-2 target nucleic  acids are NOT DETECTED.  The SARS-CoV-2 RNA is generally detectable in upper respiratory specimens during the acute phase of infection. The lowest concentration of SARS-CoV-2 viral copies this assay can detect is 138 copies/mL. A negative result does not preclude SARS-Cov-2 infection and should not be used as the sole basis for treatment or other patient management decisions. A negative result may occur with  improper specimen collection/handling, submission of specimen other than nasopharyngeal swab, presence of viral mutation(s) within the areas targeted by this assay, and inadequate number of viral copies(<138 copies/mL). A negative result must be combined with clinical observations, patient history, and epidemiological information. The expected result is Negative.  Fact Sheet for Patients:  EntrepreneurPulse.com.au  Fact Sheet for Healthcare Providers:  IncredibleEmployment.be  This test is no t yet approved or cleared by the [place] FDA and  has been authorized for detection and/or diagnosis of SARS-CoV-2 by FDA under an Emergency Use Authorization (EUA). This EUA will remain  in effect (meaning this test can be used) for the duration of the COVID-19 declaration under Section 564(b)(1) of the Act, 21 U.S.C.section 360bbb-3(b)(1), unless the authorization is terminated  or revoked sooner.       Influenza A by PCR NEGATIVE NEGATIVE Final   Influenza B by PCR NEGATIVE NEGATIVE Final    Comment: (NOTE) The Xpert Xpress SARS-CoV-2/FLU/RSV plus assay is intended as an aid in the diagnosis of influenza from Nasopharyngeal swab specimens and should not be used as a sole basis for treatment. Nasal washings and aspirates are unacceptable for Xpert Xpress SARS-CoV-2/FLU/RSV testing.  Fact Sheet for Patients: EntrepreneurPulse.com.au  Fact Sheet for Healthcare Providers: IncredibleEmployment.be  This  test is not yet approved or cleared by the [place] FDA and has been authorized for detection and/or diagnosis of SARS-CoV-2 by FDA under an Emergency Use Authorization (EUA). This EUA will remain in effect (meaning this test can be used) for the duration of the COVID-19 declaration under Section 564(b)(1) of the Act, 21 U.S.C. section 360bbb-3(b)(1), unless the authorization is terminated or revoked.  Performed at [facility], [place] [address] [place], [place] [address]   Culture, blood (routine x 2)     Status: None (Preliminary result)   Collection Time: 09/07/20 10:46 PM   Specimen: BLOOD LEFT HAND  Result Value Ref Range Status   Specimen Description BLOOD LEFT HAND  Final   Special Requests   Final    BOTTLES DRAWN AEROBIC AND ANAEROBIC Blood Culture adequate volume   Culture   Final    NO GROWTH 4 DAYS Performed at [facility], [place] [address], [place], [place] [address]    Report Status PENDING  Incomplete  Culture, blood (routine x 2)     Status: None (Preliminary result)   Collection Time: 09/07/20 10:47 PM   Specimen: BLOOD LEFT HAND  Result Value Ref Range Status   Specimen Description BLOOD LEFT HAND  Final   Special Requests   Final    BOTTLES DRAWN AEROBIC ONLY Blood Culture adequate volume   Culture   Final    NO GROWTH 4 DAYS Performed at [facility], [place] [address] [place],  [address]    Report Status PENDING  Incomplete  MRSA PCR  Screening     Status: None   Collection Time: 09/07/20 10:51 PM   Specimen: Nasopharyngeal  Result Value Ref Range Status   MRSA by PCR NEGATIVE NEGATIVE Final    Comment:        The GeneXpert MRSA Assay (FDA approved for NASAL specimens only), is one component of a comprehensive MRSA colonization surveillance program. It is not intended to diagnose MRSA infection nor to guide or monitor treatment for MRSA infections. Performed at [facility], [place] [address], [place], [place]  [address]   Body fluid culture w Gram Stain     Status: None (Preliminary result)   Collection Time: 09/09/20  3:38 PM   Specimen: Body Fluid  Result Value Ref Range Status   Specimen Description FLUID PERITONEAL  Final   Special Requests PERITONEAL FLUID  Final   Gram Stain   Final    MODERATE WBC PRESENT,BOTH PMN AND MONONUCLEAR NO ORGANISMS SEEN    Culture   Final    NO GROWTH 2 DAYS Performed at [facility], [address], [place], [place] [address]    Report Status PENDING  Incomplete         Radiology Studies: No results found.      Scheduled Meds: . sodium chloride   Intravenous Once  . amiodarone  200 mg Oral Daily  . Chlorhexidine Gluconate Cloth  6 each Topical Q0600  . [START ON 09/12/2020] cinacalcet  60 mg Oral Q M,W,F-HD  . feeding supplement (NEPRO CARB STEADY)  237 mL Oral TID BM  . gabapentin  300 mg Oral BID  . lactulose  20 g Oral BID  . mouth rinse  15 mL Mouth Rinse BID  . midodrine  5 mg Oral TID WC  . multivitamin  1 tablet Oral QHS  . sevelamer carbonate  3,200 mg Oral TID with meals   Continuous Infusions: . sodium chloride    . sodium chloride    . sodium chloride    . sodium chloride    . cefTRIAXone (ROCEPHIN)  IV 1 g (09/11/20 1120)  . dextrose Stopped (09/09/20 1210)  . sodium chloride       LOS: 4 days   Time spent: 35 mins Greater than 50% of this time was spent in counseling, explanation of diagnosis, planning of further management, and coordination of care.   Voice Recognition Viviann Spare dictation system was used to create this note, attempts have been made to correct errors. Please contact the author with questions and/or clarifications.   Florencia Reasons, MD PhD FACP [facility]  Available via Epic secure chat 7am-7pm for nonurgent issues Please page for urgent issues To page the attending provider between 7A-7P or the covering provider during after hours 7P-7A, please log into the web site [email or web address] and access  using universal [place] password for that web site. If you do not have the password, please call the hospital operator.    09/11/2020, 10:20 PM

## 2020-09-11 NOTE — Progress Notes (Signed)
SLP Cancellation Note  Patient Details Name: [name] MRN: [id number] DOB: June 22, 1961   Cancelled treatment:       Reason Eval/Treat Not Completed: Other (comment) Attempted to see pt for swallowing evaluation. RN is getting ready to transfer him to [facility] and asks that we hold for now. Will f/u as able.     Osie Bond., M.A. [facility] Pager [phone] Office [phone]  09/11/2020, 8:38 AM

## 2020-09-11 NOTE — Progress Notes (Signed)
eLink Physician-Brief Progress Note Patient Name: [name] DOB: [birth year] MRN: [id number]   Date of Service  09/11/2020  HPI/Events of Note  Patient still c/o pain at finger amputation site. Not controlled with Neurontin. Allergic to Tramadol, Morphine, ASA and Tylenol.   eICU Interventions  Plan: 1. Fentanyl 12.5 mcg IV X 1.      Intervention Category Major Interventions: Other:  Lysle Dingwall 09/11/2020, 3:36 AM

## 2020-09-11 NOTE — Progress Notes (Signed)
eLink Physician-Brief Progress Note Patient Name: [name] DOB: 01/09/1961 MRN: [id number]   Date of Service  09/11/2020  HPI/Events of Note  Request to transfer patient to a Progressive bed to make an ICU bed for a critically ill patient. Patient is on room air and no vasopressors.   eICU Interventions  Will transfer patient to a Progressive bed with cardiac monitoring.      Intervention Category Major Interventions: Other:  Lysle Dingwall 09/11/2020, 6:32 AM

## 2020-09-11 NOTE — Consult Note (Addendum)
[facility] Consult   Reason for Consult:  Tylenol overdose  Referring Physician:  Florencia Reasons, MD  Patient Identification: [name] MRN:  [id number] Principal Diagnosis: Tylenol overdose Diagnosis:  Principal Problem:   Tylenol overdose Active Problems:   Chronic hepatitis B ([place])   COPD GOLD 0 with flattening on inps f/v    Essential hypertension   Hyperkalemia   ESRD on dialysis ([place])   Leukocytosis   Total Time spent with patient: 45 minutes  HPI:   [name] is a 60 y.o. male patient admitted with PMHx of ESRD on HD (MWF), cirrhosis, chronic atrial fibrillation, COPD, CAD, recent hand injury on 2/24 for ischemic wounds several fingers who presented to [facility] ED with 1 day history of nausea, vomiting, diarrhea after ingesting 48,000 mg of atenolol over 48 hours, last took 09/06/2020.  Today, patient denies any suicidal or homicidal ideation.  He clearly states that it was not a suicidal attempt but he was just trying to control his pain.  He ran out of narcotic pain medication and was in extreme pain in his fingers and that is why it took unknown amount of Tylenol~48,000 mg.  He states this is not his first time, he has done that in the past as well but again emphasizes that this was not a suicide attempt rather an attempt to control his pain.  He denies depressed mood, any thoughts about hurting himself, changes in sleep pattern, changes in appetite or any recent decrease in activities of his interest.  He states he prefers to live by himself he enjoys it and he enjoys it immensely.  He adds sometimes he gets agitated/angry when people " pisses" him of.  He states that he understands his treatment team necessity for psychiatric consult but he does not need any help at this point but promises to let someone know, in case if he requires any assistance.  Past Psychiatric History: Alcohol use disorder, polysubstance use disorder (cocaine, tobacco),  anxiety disorder.  Patient has 1 past psychiatric inpatient hospitalization 06/10/2017 but chart review shows it was not attempted for attention seeking from his children-no psychiatric treatment required.  Risk to Self:  No Risk to Others:  No Prior Inpatient Therapy:  No Prior Outpatient Therapy:  No  Past Medical History:  Past Medical History:  Diagnosis Date  . Anemia   . Anxiety   . Arthritis    left shoulder  . Atherosclerosis of aorta ([place])   . Cardiomegaly   . Chest pain    DATE UNKNOWN, C/O PERIODICALLY  . Cocaine abuse ([place])   . COPD exacerbation ([place]) 08/17/2016  . Coronary artery disease    stent 02/22/17  . Dysrhythmia   . ESRD (end stage renal disease) on dialysis ([place])    "[name]; MWF" (07/04/2017)  . GERD (gastroesophageal reflux disease)    DATE UNKNOWN  . Hemorrhoids   . Hepatitis B, chronic ([place])   . Hepatitis C   . History of kidney stones   . Hyperkalemia   . Hypertension   . Kidney failure   . Metabolic bone disease    Patient denies  . Mitral stenosis   . Myocardial infarction ([place])   . Pneumonia   . Pulmonary edema   . Solitary rectal ulcer syndrome 07/2017   at flex sig for rectal bleeding  . Tubular adenoma of colon     Past Surgical History:  Procedure Laterality Date  . A/V FISTULAGRAM Left 05/26/2017  Procedure: A/V FISTULAGRAM;  Surgeon: Conrad Rosalie, MD;  Location: [facility];  Service: Cardiovascular;  Laterality: Left;  . A/V FISTULAGRAM Right 11/18/2017   Procedure: A/V FISTULAGRAM - Right Arm;  Surgeon: Elam Dutch, MD;  Location: [facility];  Service: Cardiovascular;  Laterality: Right;  . AMPUTATION Right 09/04/2020   Procedure: RIGHT INDEX AND RIGHT RING FINGER AMPUTATION DIGIT;  Surgeon: Leanora Cover, MD;  Location: [facility];  Service: Orthopedics;  Laterality: Right;  . APPLICATION OF WOUND VAC Left 06/14/2017   Procedure: APPLICATION OF WOUND VAC;  Surgeon: Katha Cabal, MD;  Location: [facility];   Service: Vascular;  Laterality: Left;  . AV FISTULA PLACEMENT  2012   BELIEVED WAS PLACED IN JUNE  . AV FISTULA PLACEMENT Right 08/09/2017   Procedure: Creation Right arm ARTERIOVENOUS BRACHIOCEPOHALIC FISTULA;  Surgeon: Elam Dutch, MD;  Location: [facility];  Service: Vascular;  Laterality: Right;  . AV FISTULA PLACEMENT Right 11/22/2017   Procedure: INSERTION OF ARTERIOVENOUS (AV) GORE-TEX GRAFT RIGHT UPPER ARM;  Surgeon: Elam Dutch, MD;  Location: [facility];  Service: Vascular;  Laterality: Right;  . BIOPSY  01/25/2018   Procedure: BIOPSY;  Surgeon: Jerene Bears, MD;  Location: [facility];  Service: Gastroenterology;;  . BIOPSY  04/10/2019   Procedure: BIOPSY;  Surgeon: Jerene Bears, MD;  [facility];  Service: Gastroenterology;;  . COLONOSCOPY    . COLONOSCOPY WITH PROPOFOL N/A 01/25/2018   Procedure: COLONOSCOPY WITH PROPOFOL;  Surgeon: Jerene Bears, MD;  Location: [facility];  Service: Gastroenterology;  Laterality: N/A;  . CORONARY STENT INTERVENTION N/A 02/22/2017   Procedure: CORONARY STENT INTERVENTION;  Surgeon: Nigel Mormon, MD;  Location: [facility];  Service: Cardiovascular;  Laterality: N/A;  . ESOPHAGOGASTRODUODENOSCOPY (EGD) WITH PROPOFOL N/A 01/25/2018   Procedure: ESOPHAGOGASTRODUODENOSCOPY (EGD) WITH PROPOFOL;  Surgeon: Jerene Bears, MD;  Location: [facility];  Service: Gastroenterology;  Laterality: N/A;  . ESOPHAGOGASTRODUODENOSCOPY (EGD) WITH PROPOFOL N/A 04/10/2019   Procedure: ESOPHAGOGASTRODUODENOSCOPY (EGD) WITH PROPOFOL;  Surgeon: Jerene Bears, MD;  [facility];  Service: Gastroenterology;  Laterality: N/A;  . FLEXIBLE SIGMOIDOSCOPY N/A 07/15/2017   Procedure: FLEXIBLE SIGMOIDOSCOPY;  Surgeon: Carol Ada, MD;  Location: [facility];  Service: Endoscopy;  Laterality: N/A;  . HEMORRHOID BANDING    . I & D EXTREMITY Left 06/01/2017   Procedure: IRRIGATION AND DEBRIDEMENT LEFT ARM HEMATOMA WITH LIGATION OF LEFT ARM AV  FISTULA;  Surgeon: Elam Dutch, MD;  Location: [facility];  Service: Vascular;  Laterality: Left;  . I & D EXTREMITY Left 06/14/2017   Procedure: IRRIGATION AND DEBRIDEMENT EXTREMITY;  Surgeon: Katha Cabal, MD;  Location: [facility];  Service: Vascular;  Laterality: Left;  . INSERTION OF DIALYSIS CATHETER  05/30/2017  . INSERTION OF DIALYSIS CATHETER N/A 05/30/2017   Procedure: INSERTION OF DIALYSIS CATHETER;  Surgeon: Elam Dutch, MD;  Location: [facility];  Service: Vascular;  Laterality: N/A;  . IR PARACENTESIS  08/30/2017  . IR PARACENTESIS  09/29/2017  . IR PARACENTESIS  10/28/2017  . IR PARACENTESIS  11/09/2017  . IR PARACENTESIS  11/16/2017  . IR PARACENTESIS  11/28/2017  . IR PARACENTESIS  12/01/2017  . IR PARACENTESIS  12/06/2017  . IR PARACENTESIS  01/03/2018  . IR PARACENTESIS  01/23/2018  . IR PARACENTESIS  02/07/2018  . IR PARACENTESIS  02/21/2018  . IR PARACENTESIS  03/06/2018  . IR PARACENTESIS  03/17/2018  . IR PARACENTESIS  04/04/2018  . IR PARACENTESIS  12/28/2018  . IR PARACENTESIS  01/08/2019  . IR PARACENTESIS  01/23/2019  . IR PARACENTESIS  02/01/2019  . IR PARACENTESIS  02/19/2019  . IR PARACENTESIS  03/01/2019  . IR PARACENTESIS  03/15/2019  . IR PARACENTESIS  04/03/2019  . IR PARACENTESIS  04/12/2019  . IR PARACENTESIS  05/01/2019  . IR PARACENTESIS  05/08/2019  . IR PARACENTESIS  05/24/2019  . IR PARACENTESIS  06/12/2019  . IR PARACENTESIS  07/09/2019  . IR PARACENTESIS  07/27/2019  . IR PARACENTESIS  08/09/2019  . IR PARACENTESIS  08/21/2019  . IR PARACENTESIS  09/17/2019  . IR PARACENTESIS  10/05/2019  . IR PARACENTESIS  10/29/2019  . IR PARACENTESIS  11/08/2019  . IR PARACENTESIS  12/12/2019  . IR PARACENTESIS  01/03/2020  . IR PARACENTESIS  01/10/2020  . IR PARACENTESIS  01/17/2020  . IR PARACENTESIS  01/24/2020  . IR PARACENTESIS  01/31/2020  . IR PARACENTESIS  02/07/2020  . IR PARACENTESIS  02/21/2020  . IR PARACENTESIS  05/21/2020  . IR RADIOLOGIST EVAL & MGMT  02/14/2018  .  IR RADIOLOGIST EVAL & MGMT  02/22/2019  . LEFT HEART CATH AND CORONARY ANGIOGRAPHY N/A 02/22/2017   Procedure: LEFT HEART CATH AND CORONARY ANGIOGRAPHY;  Surgeon: Nigel Mormon, MD;  Location: [facility];  Service: Cardiovascular;  Laterality: N/A;  . LIGATION OF ARTERIOVENOUS  FISTULA Left 08/19/4130   Procedure: Plication of Left Arm Arteriovenous Fistula;  Surgeon: Elam Dutch, MD;  Location: [facility];  Service: Vascular;  Laterality: Left;  . POLYPECTOMY    . POLYPECTOMY  01/25/2018   Procedure: POLYPECTOMY;  Surgeon: Jerene Bears, MD;  Location: [facility];  Service: Gastroenterology;;  . REVISON OF ARTERIOVENOUS FISTULA Left 4/40/1027   Procedure: PLICATION OF DISTAL ANEURYSMAL SEGEMENT OF LEFT UPPER ARM ARTERIOVENOUS FISTULA;  Surgeon: Elam Dutch, MD;  Location: [facility];  Service: Vascular;  Laterality: Left;  . REVISON OF ARTERIOVENOUS FISTULA Left 2/53/6644   Procedure: Plication of Left Upper Arm Fistula ;  Surgeon: Waynetta Sandy, MD;  Location: [facility];  Service: Vascular;  Laterality: Left;  . SKIN GRAFT SPLIT THICKNESS LEG / FOOT Left    SKIN GRAFT SPLIT THICKNESS LEFT ARM DONOR SITE: LEFT ANTERIOR THIGH  . SKIN SPLIT GRAFT Left 07/04/2017   Procedure: SKIN GRAFT SPLIT THICKNESS LEFT ARM DONOR SITE: LEFT ANTERIOR THIGH;  Surgeon: Elam Dutch, MD;  Location: [facility];  Service: Vascular;  Laterality: Left;  . THROMBECTOMY W/ EMBOLECTOMY Left 06/05/2017   Procedure: EXPLORATION OF LEFT ARM FOR BLEEDING; OVERSEWED PROXIMAL FISTULA;  Surgeon: Angelia Mould, MD;  Location: [facility];  Service: Vascular;  Laterality: Left;  . WOUND EXPLORATION Left 06/03/2017   Procedure: WOUND EXPLORATION WITH WOUND VAC APPLICATION TO LEFT ARM;  Surgeon: Angelia Mould, MD;  Location: [facility] OR;  Service: Vascular;  Laterality: Left;   Family History:  Family History  Problem Relation Age of Onset  . Heart disease Mother   . Lung cancer Mother   . Heart  disease Father   . Malignant hyperthermia Father   . COPD Father   . Throat cancer Sister   . Esophageal cancer Sister   . Hypertension Other   . COPD Other   . Throat cancer Sister   . Colon cancer Neg Hx   . Colon polyps Neg Hx   . Rectal cancer Neg Hx   . Stomach cancer Neg Hx  Family Psychiatric  History: Denies any family psychiatric history Social History:  Social History   Substance and Sexual Activity  Alcohol Use Not Currently   Comment: quit drinking in 2017     Social History   Substance and Sexual Activity  Drug Use Yes  . Types: Marijuana, Cocaine   Comment: reports using once every 3 months,  Quit 04-06-2019    Social History   Socioeconomic History  . Marital status: Single    Spouse name: Not on file  . Number of children: 3  . Years of education: 10  . Highest education level: Not on file  Occupational History  . Occupation: Unemployed  Tobacco Use  . Smoking status: Current Every Day Smoker    Packs/day: 1.00    Years: 43.00    Pack years: 43.00    Types: Cigarettes    Start date: 08/13/1973  . Smokeless tobacco: Never Used  Vaping Use  . Vaping Use: Never used  Substance and Sexual Activity  . Alcohol use: Not Currently    Comment: quit drinking in 2017  . Drug use: Yes    Types: Marijuana, Cocaine    Comment: reports using once every 3 months,  Quit 04-06-2019  . Sexual activity: Not Currently    Birth control/protection: None  Other Topics Concern  . Not on file  Social History Narrative   Lives alone   Caffeine use: Coffee-rare   Soda- daily      [facility] (03/10/17):   Originally from [facility]. Previously worked trimming trees. No pets currently. No bird or mold exposure.    Social Determinants of Health   Financial Resource Strain: Not on file  Food Insecurity: Not on file  Transportation Needs: Not on file  Physical Activity: Not on file  Stress: Not on file  Social Connections: Not on file   Additional Social  History:    Allergies:   Allergies  Allergen Reactions  . Tramadol Itching and Other (See Comments)    Other reaction(s): Unknown  . Grass Extracts [Gramineae Pollens]     Other reaction(s): Sneezing  . Morphine And Related Other (See Comments)    Stomach pain  . Pollen Extract Other (See Comments)    Other reaction(s): Sneezing (finding)  . Acetaminophen Nausea Only    Stomach ache   . Aspirin Itching and Other (See Comments)    STOMACH PAIN  Other reaction(s): Unknown  . Clonidine Derivatives Itching    Labs:  Results for orders placed or performed during the hospital encounter of 09/07/20 (from the past 48 hour(s))  Glucose, capillary     Status: Abnormal   Collection Time: 09/09/20  3:33 PM  Result Value Ref Range   Glucose-Capillary 139 (H) 70 - 99 mg/dL    Comment: Glucose reference range applies only to samples taken after fasting for at least 8 hours.  Albumin, pleural or peritoneal fluid     Status: None   Collection Time: 09/09/20  3:37 PM  Result Value Ref Range   Albumin, Fluid 2.1 g/dL    Comment: (NOTE) No normal range established for this test Results should be evaluated in conjunction with serum values    Fluid Type-FALB PERITONEAL CAVITY     Comment: Performed at [facility] [address] [place], [place] [address]  Protein, pleural or peritoneal fluid     Status: None   Collection Time: 09/09/20  3:37 PM  Result Value Ref Range   Total protein, fluid 4.3  g/dL    Comment: (NOTE) No normal range established for this test Results should be evaluated in conjunction with serum values    Fluid Type-FTP PERITONEAL CAVITY     Comment: Performed at [facility], [place] [address], [place], [place] [address]  Body fluid cell count with differential     Status: Abnormal   Collection Time: 09/09/20  3:37 PM  Result Value Ref Range   Fluid Type-FCT Peritoneal    Color, Fluid RED (A) YELLOW   Appearance, Fluid CLOUDY (A) CLEAR   Total  Nucleated Cell Count, Fluid 465 0 - 1,000 cu mm   Neutrophil Count, Fluid 27 (H) 0 - 25 %   Lymphs, Fluid 26 %   Monocyte-Macrophage-Serous Fluid 47 (L) 50 - 90 %   Eos, Fluid 0 %    Comment: Performed at [facility] [address], [place], [place] [address]  Lactate dehydrogenase (pleural or peritoneal fluid)     Status: Abnormal   Collection Time: 09/09/20  3:37 PM  Result Value Ref Range   LD, Fluid 128 (H) 3 - 23 U/L    Comment: (NOTE) Results should be evaluated in conjunction with serum values    Fluid Type-FLDH Peritoneal     Comment: Performed at [facility], [place] [address] [place], [place] [address]  Cytology - Non PAP;     Status: None   Collection Time: 09/09/20  3:38 PM  Result Value Ref Range   CYTOLOGY - NON GYN      CYTOLOGY - NON PAP CASE: [id number] PATIENT: Honor Loh Non-Gynecological Cytology Report     Clinical History: None provided Specimen Submitted:  A. ASCITES, PARACENTESIS:   FINAL MICROSCOPIC DIAGNOSIS: - No malignant cells identified  SPECIMEN ADEQUACY: Satisfactory for evaluation  GROSS: Received is/are 8cc's of orange fluid. (TB:tb) Smears: 0 Concentration Method (Thin Prep):1 Cell Block: 1 Conventional Additional Studies: 2 Hematology slides labeled [id number]     Final Diagnosis performed by Claudette Laws, MD.   Electronically signed 09/11/2020 Technical and / or Professional components performed at [facility]. [facility], [place] [address], [place], [place] [address].  Immunohistochemistry Technical component (if applicable) was performed at [facility]. [address] [place], [place], [place] [address].   IMMUNOHISTOCHEMISTRY DISCLAIMER (if applicable): Some of these immunohistochemical stains may have been developed an d the performance characteristics determine by [facility]. Some may not have been cleared or approved by the U.S. Food and Drug Administration. The FDA  has determined that such clearance or approval is not necessary. This test is used for clinical purposes. It should not be regarded as investigational or for research. This laboratory is certified under the [place] (CLIA-88) as qualified to perform high complexity clinical laboratory testing.  The controls stained appropriately.   Body fluid culture w Gram Stain     Status: None (Preliminary result)   Collection Time: 09/09/20  3:38 PM   Specimen: Body Fluid  Result Value Ref Range   Specimen Description FLUID PERITONEAL    Special Requests PERITONEAL FLUID    Gram Stain      MODERATE WBC PRESENT,BOTH PMN AND MONONUCLEAR NO ORGANISMS SEEN    Culture      NO GROWTH 2 DAYS Performed at [facility], [place] [address], [place], [place] [address]    Report Status PENDING   Protime-INR     Status: Abnormal   Collection Time: 09/09/20  5:47 PM  Result Value Ref Range   Prothrombin Time 20.1 (H) 11.4 - 15.2 seconds   INR 1.8 (H) 0.8 - 1.2    Comment: (NOTE) INR goal varies based on device and disease states. Performed at [facility], [place] [address] [place], [place] [address]   Albumin     Status: Abnormal   Collection Time: 09/09/20  5:47 PM  Result Value Ref Range   Albumin 2.9 (L) 3.5 - 5.0 g/dL    Comment: Performed at [facility], [place] [address], [place], [place] [address]  Glucose, capillary     Status: Abnormal   Collection Time: 09/09/20  7:39 PM  Result Value Ref Range   Glucose-Capillary 121 (H) 70 - 99 mg/dL    Comment: Glucose reference range applies only to samples taken after fasting for at least 8 hours.  Glucose, capillary     Status: Abnormal   Collection Time: 09/09/20 11:35 PM  Result Value Ref Range   Glucose-Capillary 140 (H) 70 - 99 mg/dL    Comment: Glucose reference range applies only to samples taken after fasting for at least 8 hours.  Hepatic function panel     Status: Abnormal   Collection  Time: 09/10/20 12:51 AM  Result Value Ref Range   Total Protein 6.5 6.5 - 8.1 g/dL   Albumin 2.8 (L) 3.5 - 5.0 g/dL   AST 118 (H) 15 - 41 U/L   ALT 41 0 - 44 U/L   Alkaline Phosphatase 190 (H) 38 - 126 U/L   Total Bilirubin 1.1 0.3 - 1.2 mg/dL   Bilirubin, Direct 0.2 0.0 - 0.2 mg/dL   Indirect Bilirubin NOT CALCULATED 0.3 - 0.9 mg/dL    Comment: Performed at [facility] [address], [place], [place] [address]  Protime-INR     Status: Abnormal   Collection Time: 09/10/20 12:51 AM  Result Value Ref Range   Prothrombin Time 18.9 (H) 11.4 - 15.2 seconds   INR 1.6 (H) 0.8 - 1.2    Comment: (NOTE) INR goal varies based on device and disease states. Performed at [facility], [place] [address] [place], [place] [address]   Glucose, capillary     Status: Abnormal   Collection Time: 09/10/20  3:48 AM  Result Value Ref Range   Glucose-Capillary 143 (H) 70 - 99 mg/dL    Comment: Glucose reference range applies only to samples taken after fasting for at least 8 hours.  Glucose, capillary     Status: Abnormal   Collection Time: 09/10/20  8:39 AM  Result Value Ref Range   Glucose-Capillary 130 (H) 70 - 99 mg/dL    Comment: Glucose reference range applies only to samples taken after fasting for at least 8 hours.  CBC     Status: Abnormal   Collection Time: 09/10/20 11:00 AM  Result Value Ref Range   WBC 13.7 (H) 4.0 - 10.5 K/uL   RBC RESULTS UNAVAILABLE DUE TO INTERFERING SUBSTANCE 4.22 - 5.81 MIL/uL   Hemoglobin 10.6 (L) 13.0 - 17.0 g/dL   HCT RESULTS UNAVAILABLE DUE TO INTERFERING SUBSTANCE 39.0 - 52.0 %   MCV RESULTS UNAVAILABLE DUE TO INTERFERING SUBSTANCE 80.0 - 100.0 fL   MCH RESULTS UNAVAILABLE DUE TO INTERFERING SUBSTANCE 26.0 - 34.0 pg   MCHC RESULTS UNAVAILABLE DUE TO INTERFERING SUBSTANCE 30.0 - 36.0 g/dL   RDW RESULTS UNAVAILABLE DUE TO INTERFERING SUBSTANCE 11.5 - 15.5 %   Platelets 162 150 -  400 K/uL    Comment: Performed at [facility], [place] [address] [place], [place] [address]  Basic metabolic panel     Status: Abnormal   Collection Time: 09/10/20 11:00 AM  Result Value Ref Range   Sodium 135 135 - 145 mmol/L   Potassium 3.9 3.5 - 5.1 mmol/L   Chloride 88 (L) 98 - 111 mmol/L   CO2 17 (L) 22 - 32 mmol/L   Glucose, Bld 117 (H) 70 - 99 mg/dL    Comment: Glucose reference range applies only to samples taken after fasting for at least 8 hours.   BUN 38 (H) 6 - 20 mg/dL   Creatinine, Ser 10.05 (H) 0.61 - 1.24 mg/dL   Calcium 10.3 8.9 - 10.3 mg/dL   GFR, Estimated 5 (L) >60 mL/min    Comment: (NOTE) Calculated using the CKD-EPI Creatinine Equation (2021)    Anion gap 30 (H) 5 - 15    Comment: Performed at [facility] [address] [place], [place] [address]  Glucose, capillary     Status: Abnormal   Collection Time: 09/10/20 11:45 AM  Result Value Ref Range   Glucose-Capillary 105 (H) 70 - 99 mg/dL    Comment: Glucose reference range applies only to samples taken after fasting for at least 8 hours.  Glucose, capillary     Status: Abnormal   Collection Time: 09/10/20  3:33 PM  Result Value Ref Range   Glucose-Capillary 102 (H) 70 - 99 mg/dL    Comment: Glucose reference range applies only to samples taken after fasting for at least 8 hours.  Renal function panel     Status: Abnormal   Collection Time: 09/10/20  3:59 PM  Result Value Ref Range   Sodium 137 135 - 145 mmol/L   Potassium 3.6 3.5 - 5.1 mmol/L   Chloride 91 (L) 98 - 111 mmol/L   CO2 22 22 - 32 mmol/L   Glucose, Bld 106 (H) 70 - 99 mg/dL    Comment: Glucose reference range applies only to samples taken after fasting for at least 8 hours.   BUN 19 6 - 20 mg/dL   Creatinine, Ser 6.17 (H) 0.61 - 1.24 mg/dL    Comment: DELTA CHECK NOTED   Calcium 10.4 (H) 8.9 - 10.3 mg/dL   Phosphorus 3.2 2.5 - 4.6 mg/dL   Albumin 3.3 (L) 3.5 - 5.0 g/dL   GFR, Estimated 10 (L) >60 mL/min    Comment: (NOTE) Calculated using the CKD-EPI Creatinine Equation (2021)    Anion gap  24 (H) 5 - 15    Comment: Performed at [facility] [address] [place], [place] [address]  Glucose, capillary     Status: Abnormal   Collection Time: 09/10/20  7:52 PM  Result Value Ref Range   Glucose-Capillary 119 (H) 70 - 99 mg/dL    Comment: Glucose reference range applies only to samples taken after fasting for at least 8 hours.  Hepatic function panel     Status: Abnormal   Collection Time: 09/11/20 12:47 AM  Result Value Ref Range   Total Protein 6.8 6.5 - 8.1 g/dL   Albumin 3.3 (L) 3.5 - 5.0 g/dL   AST 81 (H) 15 - 41 U/L   ALT 28 0 - 44 U/L   Alkaline Phosphatase 199 (H) 38 - 126 U/L   Total Bilirubin 1.3 (H) 0.3 - 1.2 mg/dL   Bilirubin, Direct 0.4 (H) 0.0 - 0.2 mg/dL  Indirect Bilirubin 0.9 0.3 - 0.9 mg/dL    Comment: Performed at [facility], [place] [address], [place], [place] [address]  Protime-INR     Status: Abnormal   Collection Time: 09/11/20 12:47 AM  Result Value Ref Range   Prothrombin Time 15.9 (H) 11.4 - 15.2 seconds   INR 1.3 (H) 0.8 - 1.2    Comment: (NOTE) INR goal varies based on device and disease states. Performed at [facility], [place] [address], [place], [place] [address]   Basic metabolic panel     Status: Abnormal   Collection Time: 09/11/20 12:47 AM  Result Value Ref Range   Sodium 135 135 - 145 mmol/L   Potassium 3.2 (L) 3.5 - 5.1 mmol/L   Chloride 92 (L) 98 - 111 mmol/L   CO2 22 22 - 32 mmol/L   Glucose, Bld 127 (H) 70 - 99 mg/dL    Comment: Glucose reference range applies only to samples taken after fasting for at least 8 hours.   BUN 20 6 - 20 mg/dL   Creatinine, Ser 6.68 (H) 0.61 - 1.24 mg/dL   Calcium 9.9 8.9 - 10.3 mg/dL   GFR, Estimated 9 (L) >60 mL/min    Comment: (NOTE) Calculated using the CKD-EPI Creatinine Equation (2021)    Anion gap 21 (H) 5 - 15    Comment: Performed at [facility] [address] [place], [place] [address]  CBC     Status: Abnormal   Collection Time: 09/11/20 12:47 AM   Result Value Ref Range   WBC 10.0 4.0 - 10.5 K/uL   RBC 3.35 (L) 4.22 - 5.81 MIL/uL   Hemoglobin 10.2 (L) 13.0 - 17.0 g/dL   HCT 25.7 (L) 39.0 - 52.0 %   MCV 76.7 (L) 80.0 - 100.0 fL   MCH 30.4 26.0 - 34.0 pg   MCHC 39.7 (H) 30.0 - 36.0 g/dL   RDW 15.6 (H) 11.5 - 15.5 %   Platelets 140 (L) 150 - 400 K/uL    Comment: REPEATED TO VERIFY   nRBC 0.9 (H) 0.0 - 0.2 %    Comment: Performed at [facility], [place] [address] [place], [place] [address]  Glucose, capillary     Status: Abnormal   Collection Time: 09/11/20  1:07 AM  Result Value Ref Range   Glucose-Capillary 133 (H) 70 - 99 mg/dL    Comment: Glucose reference range applies only to samples taken after fasting for at least 8 hours.  Glucose, capillary     Status: Abnormal   Collection Time: 09/11/20  3:05 AM  Result Value Ref Range   Glucose-Capillary 129 (H) 70 - 99 mg/dL    Comment: Glucose reference range applies only to samples taken after fasting for at least 8 hours.  Glucose, capillary     Status: Abnormal   Collection Time: 09/11/20  7:53 AM  Result Value Ref Range   Glucose-Capillary 122 (H) 70 - 99 mg/dL    Comment: Glucose reference range applies only to samples taken after fasting for at least 8 hours.  Glucose, capillary     Status: Abnormal   Collection Time: 09/11/20 11:35 AM  Result Value Ref Range   Glucose-Capillary 112 (H) 70 - 99 mg/dL    Comment: Glucose reference range applies only to samples taken after fasting for at least 8 hours.    Current Facility-Administered Medications  Medication Dose Route Frequency Provider Last Rate Last Admin  . 0.9 %  sodium chloride infusion (Manually program via Guardrails IV Fluids)   Intravenous Once Mannam, Praveen, MD      . 0.9 %  sodium chloride infusion  250 mL Intravenous Continuous Einar Grad, RPH      . 0.9 %  sodium chloride infusion  250 mL Intravenous Continuous Icard, Bradley L, DO      . 0.9 %  sodium chloride infusion  100 mL Intravenous  PRN Justin Mend, MD      . 0.9 %  sodium chloride infusion  100 mL Intravenous PRN Justin Mend, MD      . alteplase (CATHFLO ACTIVASE) injection 2 mg  2 mg Intracatheter Once PRN Justin Mend, MD      . amiodarone (PACERONE) tablet 200 mg  200 mg Oral Daily Icard, Bradley L, DO   200 mg at 09/11/20 1053  . cefTRIAXone (ROCEPHIN) 1 g in sodium chloride 0.9 % 100 mL IVPB  1 g Intravenous Q24H Icard, Bradley L, DO 200 mL/hr at 09/11/20 1120 1 g at 09/11/20 1120  . Chlorhexidine Gluconate Cloth 2 % PADS 6 each  6 each Topical Q0600 Corliss Parish, MD   6 each at 09/10/20 1500  . [START ON 09/12/2020] Chlorhexidine Gluconate Cloth 2 % PADS 6 each  6 each Topical Q0600 Corliss Parish, MD      . Derrill Memo ON 09/12/2020] cinacalcet (SENSIPAR) tablet 60 mg  60 mg Oral Q M,W,F-HD Corliss Parish, MD      . dextrose 10 % infusion   Intravenous Continuous Marshell Garfinkel, MD   Stopped at 09/09/20 1210  . feeding supplement (NEPRO CARB STEADY) liquid 237 mL  237 mL Oral TID BM Icard, Bradley L, DO   237 mL at 09/11/20 1053  . gabapentin (NEURONTIN) capsule 300 mg  300 mg Oral BID Anders Simmonds, MD   300 mg at 09/11/20 1053  . haloperidol (HALDOL) tablet 1 mg  1 mg Oral Q6H PRN Icard, Bradley L, DO      . haloperidol lactate (HALDOL) injection 1 mg  1 mg Intravenous Q6H PRN Einar Grad, RPH   1 mg at 09/11/20 4166  . heparin injection 1,000 Units  1,000 Units Dialysis PRN Justin Mend, MD      . lactulose (CHRONULAC) 10 GM/15ML solution 20 g  20 g Oral BID Icard, Bradley L, DO      . lidocaine (PF) (XYLOCAINE) 1 % injection 5 mL  5 mL Intradermal PRN Justin Mend, MD      . lidocaine-prilocaine (EMLA) cream 1 application  1 application Topical PRN Justin Mend, MD      . MEDLINE mouth rinse  15 mL Mouth Rinse BID Mannam, Praveen, MD   15 mL at 09/11/20 1053  . metoprolol tartrate (LOPRESSOR) injection 2.5 mg  2.5 mg Intravenous Q4H PRN Florencia Reasons, MD      .  midodrine (PROAMATINE) tablet 5 mg  5 mg Oral TID WC Icard, Bradley L, DO   5 mg at 09/11/20 1223  . multivitamin (RENA-VIT) tablet 1 tablet  1 tablet Oral QHS Icard, Bradley L, DO      . pentafluoroprop-tetrafluoroeth (GEBAUERS) aerosol 1 application  1 application Topical PRN Justin Mend, MD      . potassium chloride SA (KLOR-CON) CR tablet 40 mEq  40 mEq Oral Once Florencia Reasons, MD      . sevelamer carbonate (RENVELA) tablet 3,200 mg  3,200 mg Oral TID with meals Hal Hope,  Doreatha Lew, MD      . sodium chloride 0.9 % bolus 500 mL  500 mL Intravenous Once Rise Patience, MD        Musculoskeletal: Strength & Muscle Tone: decreased Gait & Station: normal Patient leans: N/A  Psychiatric Specialty Exam: Physical Exam Vitals and nursing note reviewed.  Constitutional:      Appearance: Normal appearance.  HENT:     Head: Normocephalic and atraumatic.     Mouth/Throat:     Mouth: Mucous membranes are moist.  Cardiovascular:     Rate and Rhythm: Normal rate and regular rhythm.  Pulmonary:     Effort: Pulmonary effort is normal.  Abdominal:     Tenderness: There is abdominal tenderness.  Musculoskeletal:     Cervical back: Normal range of motion.  Skin:    General: Skin is warm and dry.  Neurological:     Mental Status: He is alert and oriented to person, place, and time.     Review of Systems  Blood pressure (!) 104/51, pulse 86, temperature 98.5 F (36.9 C), temperature source Oral, resp. rate 13, height 5\' 9"  (1.753 m), weight 71.1 kg, SpO2 94 %.Body mass index is 23.15 kg/m.  General Appearance: Casual  Eye Contact:  Good  Speech:  Normal Rate  Volume:  Normal  Mood:  Normal  Affect:  Appropriate  Thought Process:  Coherent and Descriptions of Associations: Intact  Orientation:  Full (Time, Place, and Person)  Thought Content:  Logical  Suicidal Thoughts:  No  Homicidal Thoughts:  No  Memory:  Immediate;   Good Recent;   Fair Remote;   Fair  Judgement:  Good   Insight:  Good  Psychomotor Activity:  Normal  Concentration:  Concentration: Good and Attention Span: Good  Recall:  [place] of Knowledge:  Good  Language:  Good  Akathisia:  No  Handed:  Right  AIMS (if indicated):     Assets:  Communication Skills Desire for Improvement Resilience  ADL's:  Impaired  Cognition:  Impaired,  Mild  Sleep:      Assessment:60 year-old admitted after Tylenol overdose.  His presentation, most likely due to chronic pain. -On assessment today patient denies any suicidal or homicidal ideations.  He is adamant that it  was not suicidal attempt rather his desperation to control his pain as he was out of narcotic medication.  He denies any auditory or visual hallucination does not seem like responding to any internal stimuli as well.  He does not meet the criteria for major depressive disorder, anxiety or psychosis.   -Patient is difficult to engage and uncooperative at times but responds to active listening and empathy.   Recommendations: -Palliative care consult is recommended due to multiple medical problems -Patient will keep on benefiting from appropriate pain management -Psychiatry is signing off but will be available for any further questions.  Disposition: No evidence of imminent risk to self or others at present.   Patient does not meet criteria for psychiatric inpatient admission.  Honor Junes, MD 09/11/2020 2:32 PM

## 2020-09-11 NOTE — Progress Notes (Signed)
Subjective:    Had HD yest removed 2 liters.  He is more alert, more like himself but still a little off-  Knew me after I said my name   Objective Vital signs in last 24 hours: Vitals:   09/11/20 0356 09/11/20 0400 09/11/20 0500 09/11/20 0609  BP: 102/68 (!) 92/57  104/65  Pulse: 72 67 91 82  Resp: 16 13 17 18   Temp:      TempSrc:      SpO2: 92% 92% 93% 92%  Weight:   71.1 kg   Height:       Weight change: -0.1 kg  Intake/Output Summary (Last 24 hours) at 09/11/2020 0735 Last data filed at 09/10/2020 1440 Gross per 24 hour  Intake 308.55 ml  Output 2000 ml  Net -1691.45 ml   [facility] HD: [facility]   4h  450/800  70kg  2/2 bath  Hep none  RUA AVG  - hect 1, sensipar 60   Assessment/ Plan: Pt is a 60 y.o. yo male with ESRD, cirrhosis, COPD, CAD who was admitted on 09/07/2020 with  missed HD and  tylenol OD in the setting of finger amputations/attempting pain control  Assessment/Plan: 1. Tylenol OD-  Acetylcysteine stopped , serial levels-  INR trending down 2. ESRD -  Notorious non compliant with HD-  Missed Friday as OP.  Hd done here early Mon AM, then Wednesday.  Next will be Friday - tomorrow 3. Anemia- not a major issue at present but dropping with elevated INR 4. Secondary hyperparathyroidism- should be on sensipar with HD, hold hectorol wth high calcium- also on renvela but was NPO so holding meds- able to resume sensipar but hold renvela-  Phos yest was 3.2! 5. HTN/volume-  BP low right now- supposed to be on metoprolol and dilt as OP ? -  Unclear if taking - now on amio.  Volume status seems OK-  Was getting IVF for sugar, now off.  Also now on midodrine  6. Hyperkalemia-  On admit-  emergent HD and corrected early AM Monday-  Due to non compliance with HD-  7. Confusion-  Low sugar, corrected -  ammonia level slightly high-  Supportive care.  Is angry at baseline but usually oriented.  He is better today in this regard - asking about going home- to e moved to lower level of care,  will do HD in HD unit tomorrow  [place]: Basic Metabolic Panel: Recent Labs  Lab 09/10/20 1100 09/10/20 1559 09/11/20 0047  NA 135 137 135  K 3.9 3.6 3.2*  CL 88* 91* 92*  CO2 17* 22 22  GLUCOSE 117* 106* 127*  BUN 38* 19 20  CREATININE 10.05* 6.17* 6.68*  CALCIUM 10.3 10.4* 9.9  PHOS  --  3.2  --    Liver Function Tests: Recent Labs  Lab 09/09/20 0304 09/09/20 1747 09/10/20 0051 09/10/20 1559 09/11/20 0047  AST 178*  --  118*  --  81*  ALT 60*  --  41  --  28  ALKPHOS 216*  --  190*  --  199*  BILITOT 1.4*  --  1.1  --  1.3*  PROT 7.3  --  6.5  --  6.8  ALBUMIN 3.4*   < > 2.8* 3.3* 3.3*   < > = values in this interval not displayed.   No results for input(s): LIPASE, AMYLASE in the last 168 hours. Recent Labs  Lab 09/08/20 0811  AMMONIA  41*   CBC: Recent Labs  Lab 09/07/20 1744 09/07/20 2340 09/08/20 0104 09/09/20 0304 09/10/20 1100 09/11/20 0047  WBC 16.3* 14.8* 15.0* 14.6* 13.7* 10.0  NEUTROABS 14.0* 12.6*  --   --   --   --   HGB 12.7* 11.9* 11.6* 10.6* 10.6* 10.2*  HCT 35.6* 32.8* 31.4* 28.9* RESULTS UNAVAILABLE DUE TO INTERFERING SUBSTANCE 25.7*  MCV 85.0 82.2 82.4 80.3 RESULTS UNAVAILABLE DUE TO INTERFERING SUBSTANCE 76.7*  PLT 296 317 301 189 162 140*   Cardiac Enzymes: No results for input(s): CKTOTAL, CKMB, CKMBINDEX, TROPONINI in the last 168 hours. CBG: Recent Labs  Lab 09/10/20 1145 09/10/20 1533 09/10/20 1952 09/11/20 0107 09/11/20 0305  GLUCAP 105* 102* 119* 133* 129*    Iron Studies: No results for input(s): IRON, TIBC, TRANSFERRIN, FERRITIN in the last 72 hours. Studies/Results: No results found. Medications: Infusions: . sodium chloride    . sodium chloride    . sodium chloride    . sodium chloride    . cefTRIAXone (ROCEPHIN)  IV    . dextrose Stopped (09/09/20 1210)  . sodium chloride      Scheduled Medications: . sodium chloride   Intravenous Once  . amiodarone  200 mg Oral Daily  .  Chlorhexidine Gluconate Cloth  6 each Topical Q0600  . feeding supplement (NEPRO CARB STEADY)  237 mL Oral TID BM  . gabapentin  300 mg Oral BID  . lactulose  20 g Oral BID  . mouth rinse  15 mL Mouth Rinse BID  . midodrine  5 mg Oral TID WC  . multivitamin  1 tablet Oral QHS  . sevelamer carbonate  3,200 mg Oral TID with meals    have reviewed scheduled and prn medications.  Physical Exam: General: more alert for me today  Heart: irreg Lungs: mostly clear  Abdomen: soft, non tender Extremities: min peripheral edema Dialysis Access: right upper arm AVG-  Bandaged-  Bruit heard    09/11/2020,7:35 AM  LOS: 4 days

## 2020-09-12 ENCOUNTER — Other Ambulatory Visit (HOSPITAL_COMMUNITY): Payer: Self-pay | Admitting: Internal Medicine

## 2020-09-12 DIAGNOSIS — E162 Hypoglycemia, unspecified: Secondary | ICD-10-CM

## 2020-09-12 DIAGNOSIS — B181 Chronic viral hepatitis B without delta-agent: Secondary | ICD-10-CM | POA: Diagnosis not present

## 2020-09-12 DIAGNOSIS — T391X1A Poisoning by 4-Aminophenol derivatives, accidental (unintentional), initial encounter: Secondary | ICD-10-CM | POA: Diagnosis not present

## 2020-09-12 DIAGNOSIS — N186 End stage renal disease: Secondary | ICD-10-CM | POA: Diagnosis not present

## 2020-09-12 LAB — CBC
Hemoglobin: 12 g/dL — ABNORMAL LOW (ref 13.0–17.0)
Hemoglobin: 11.1 g/dL — ABNORMAL LOW (ref 13.0–17.0)
Platelets: 131 10*3/uL — ABNORMAL LOW (ref 150–400)
Platelets: 133 10*3/uL — ABNORMAL LOW (ref 150–400)
WBC: 12.4 10*3/uL — ABNORMAL HIGH (ref 4.0–10.5)
WBC: 11.5 10*3/uL — ABNORMAL HIGH (ref 4.0–10.5)

## 2020-09-12 LAB — BASIC METABOLIC PANEL
Anion gap: 24 — ABNORMAL HIGH (ref 5–15)
BUN: 31 mg/dL — ABNORMAL HIGH (ref 6–20)
CO2: 17 mmol/L — ABNORMAL LOW (ref 22–32)
Calcium: 10.8 mg/dL — ABNORMAL HIGH (ref 8.9–10.3)
Chloride: 91 mmol/L — ABNORMAL LOW (ref 98–111)
Creatinine, Ser: 8.28 mg/dL — ABNORMAL HIGH (ref 0.61–1.24)
GFR, Estimated: 7 mL/min — ABNORMAL LOW (ref >60)
Glucose, Bld: 117 mg/dL — ABNORMAL HIGH (ref 70–99)
Potassium: 3.9 mmol/L (ref 3.5–5.1)
Sodium: 132 mmol/L — ABNORMAL LOW (ref 135–145)

## 2020-09-12 LAB — HEPATIC FUNCTION PANEL
ALT: 19 U/L (ref 0–44)
AST: 56 U/L — ABNORMAL HIGH (ref 15–41)
Albumin: 3.6 g/dL (ref 3.5–5.0)
Alkaline Phosphatase: 231 U/L — ABNORMAL HIGH (ref 38–126)
Bilirubin, Direct: 0.4 mg/dL — ABNORMAL HIGH (ref 0.0–0.2)
Indirect Bilirubin: 1 mg/dL — ABNORMAL HIGH (ref 0.3–0.9)
Total Bilirubin: 1.4 mg/dL — ABNORMAL HIGH (ref 0.3–1.2)
Total Protein: 7.4 g/dL (ref 6.5–8.1)

## 2020-09-12 LAB — GLUCOSE, CAPILLARY
Glucose-Capillary: <10 mg/dL — CL (ref 70–99)
Glucose-Capillary: 112 mg/dL — ABNORMAL HIGH (ref 70–99)

## 2020-09-12 LAB — PROTIME-INR
INR: 1.2 (ref 0.8–1.2)
Prothrombin Time: 14.9 seconds (ref 11.4–15.2)

## 2020-09-12 LAB — CULTURE, BLOOD (ROUTINE X 2)
Culture: NO GROWTH
Culture: NO GROWTH
Special Requests: ADEQUATE
Special Requests: ADEQUATE

## 2020-09-12 MED ORDER — FENTANYL CITRATE (PF) 100 MCG/2ML IJ SOLN
12.5000 ug | Freq: Once | INTRAMUSCULAR | Status: AC | PRN
  Administered 2020-09-12: 12.5 ug via INTRAVENOUS
  Filled 2020-09-12: qty 2

## 2020-09-12 MED ORDER — AMOXICILLIN-POT CLAVULANATE 500-125 MG PO TABS: 1.0000 | ORAL_TABLET | Freq: Every day | ORAL | Status: DC

## 2020-09-12 MED ORDER — LACTULOSE 10 GM/15ML PO SOLN: 20.0000 g | Freq: Every day | ORAL | 0 refills | Status: AC

## 2020-09-12 MED ORDER — OXYCODONE HCL 10 MG PO TABS: 10.0000 mg | ORAL_TABLET | Freq: Three times a day (TID) | ORAL | 0 refills | Status: DC | PRN

## 2020-09-12 MED ORDER — GABAPENTIN 300 MG PO CAPS: 300.0000 mg | ORAL_CAPSULE | Freq: Every day | ORAL | Status: DC

## 2020-09-12 MED ORDER — FENTANYL CITRATE (PF) 100 MCG/2ML IJ SOLN
25.0000 ug | Freq: Once | INTRAMUSCULAR | Status: AC
  Administered 2020-09-12: 25 ug via INTRAVENOUS

## 2020-09-12 MED ORDER — NEPRO/CARBSTEADY PO LIQD: 237.0000 mL | Freq: Three times a day (TID) | ORAL | 0 refills | Status: DC

## 2020-09-12 MED ORDER — CINACALCET HCL 30 MG PO TABS: 60.0000 mg | ORAL_TABLET | ORAL | 0 refills | Status: DC

## 2020-09-12 MED ORDER — SULFAMETHOXAZOLE-TRIMETHOPRIM 800-160 MG PO TABS: 1.0000 | ORAL_TABLET | Freq: Every day | ORAL | 0 refills | Status: DC

## 2020-09-12 MED ORDER — GABAPENTIN 300 MG PO CAPS: 300.0000 mg | ORAL_CAPSULE | Freq: Every day | ORAL | 0 refills | Status: DC

## 2020-09-12 MED ORDER — FENTANYL CITRATE (PF) 100 MCG/2ML IJ SOLN
INTRAMUSCULAR | Status: AC
  Filled 2020-09-12: qty 2

## 2020-09-12 MED ORDER — RENA-VITE PO TABS: 1.0000 | ORAL_TABLET | Freq: Every day | ORAL | 0 refills | Status: DC

## 2020-09-12 MED FILL — GABAPENTIN 300 MG CAPSULE: 300 | 30 days supply | Qty: 30 | Fill #0

## 2020-09-12 MED FILL — RENA-VITE TABS: 30 days supply | Qty: 30 | Fill #0

## 2020-09-12 MED FILL — oxyCODONE HCL 10 MG TABS: 10 | 3 days supply | Qty: 10 | Fill #0

## 2020-09-12 MED FILL — SULFAMETHOXAZOLE-TMP DS TAB: 800-160 | 30 days supply | Qty: 30 | Fill #0

## 2020-09-12 MED FILL — LACTULOSE 10 GM/15 ML SOLN: 10 | 7 days supply | Qty: 236 | Fill #0

## 2020-09-12 NOTE — Procedures (Signed)
Patient was seen on dialysis and the procedure was supervised.  BFR 400  Via AVF BP is  124/94.   Patient appears to be tolerating treatment well  Louis Meckel 09/12/2020

## 2020-09-12 NOTE — Plan of Care (Signed)

## 2020-09-12 NOTE — Evaluation (Signed)
Physical Therapy Evaluation Patient Details Name: [name] MRN: [id number] DOB: Mar 02, 1961 Today's Date: 09/12/2020   History of Present Illness  Pt adm 2/27 with tylenol overdose. Pt had recent amputation of two fingers on rt hand. PMH - ESRD onHD, pulmonary HTN, copd, polysubstance abuse, cirrhosis.  Clinical Impression  Pt doing well with mobility and no further PT needed.  Ready for dc from PT standpoint.      Follow Up Recommendations No PT follow up    Equipment Recommendations  None recommended by PT    Recommendations for Other Services       Precautions / Restrictions Precautions Precautions: None Required Braces or Orthoses: Sling (? for comfort)      Mobility  Bed Mobility               General bed mobility comments: Pt sitting EOB    Transfers Overall transfer level: Modified independent Equipment used: None                Ambulation/Gait Ambulation/Gait assistance: Modified independent (Device/Increase time) Gait Distance (Feet): 200 Feet Assistive device: None Gait Pattern/deviations: WFL(Within Functional Limits) Gait velocity: decr Gait velocity interpretation: 1.31 - 2.62 ft/sec, indicative of limited community ambulator General Gait Details: Steady gait  Stairs            Wheelchair Mobility    Modified Rankin (Stroke Patients Only)       Balance Overall balance assessment: No apparent balance deficits (not formally assessed)                                           Pertinent Vitals/Pain Pain Assessment: Faces Faces Pain Scale: Hurts little more Pain Location: rt hand Pain Descriptors / Indicators: Grimacing;Guarding Pain Intervention(s): Repositioned;Monitored during session    Home Living Family/patient expects to be discharged to:: Private residence Living Arrangements: Alone Available Help at Discharge: Friend(s);Available PRN/intermittently;Personal care attendant Type of  Home: Apartment Home Access: Stairs to enter Entrance Stairs-Rails: None Entrance Stairs-Number of Steps: 2 Home Layout: One level Home Equipment: Walker - 2 wheels;Cane - single point;Shower seat      Prior Function Level of Independence: Independent               Hand Dominance   Dominant Hand: Right    Extremity/Trunk Assessment   Upper Extremity Assessment Upper Extremity Assessment: RUE deficits/detail RUE Deficits / Details: finger amputations and dressing present    Lower Extremity Assessment Lower Extremity Assessment: Overall WFL for tasks assessed       Communication   Communication: No difficulties  Cognition Arousal/Alertness: Awake/alert Behavior During Therapy: WFL for tasks assessed/performed Overall Cognitive Status: No family/caregiver present to determine baseline cognitive functioning                                        General Comments      Exercises     Assessment/Plan    PT Assessment Patent does not need any further PT services  PT Problem List         PT Treatment Interventions      PT Goals (Current goals can be found in the Care Plan section)  Acute Rehab PT Goals PT Goal Formulation: All assessment and education complete, DC therapy    Frequency  Barriers to discharge        Co-evaluation               AM-PAC PT "6 Clicks" Mobility  Outcome Measure Help needed turning from your back to your side while in a flat bed without using bedrails?: None Help needed moving from lying on your back to sitting on the side of a flat bed without using bedrails?: None Help needed moving to and from a bed to a chair (including a wheelchair)?: None Help needed standing up from a chair using your arms (e.g., wheelchair or bedside chair)?: None Help needed to walk in hospital room?: None Help needed climbing 3-5 steps with a railing? : None 6 Click Score: 24    End of Session   Activity Tolerance:  Patient tolerated treatment well Patient left: in bed;with call bell/phone within reach Nurse Communication: Mobility status PT Visit Diagnosis: Other abnormalities of gait and mobility (R26.89)    Time: 1435-1450 PT Time Calculation (min) (ACUTE ONLY): 15 min   Charges:   PT Evaluation $PT Eval Low Complexity: 1 Low          [facility] PT Acute Rehabilitation Services Pager [phone] Office [place] 09/12/2020, 5:12 PM

## 2020-09-12 NOTE — Discharge Summary (Addendum)
Discharge Summary  [name] MVH:[id number] DOB: 07-14-1960  PCP: Charlott Rakes, MD  Admit date: 09/07/2020 Discharge date: 09/12/2020  Time spent: 68mins, more than 50% time spent on coordination of care.   Recommendations for Outpatient Follow-up:  1. F/u with PCP within a week  for hospital discharge follow up, repeat cbc/bmp at follow up, please follow-up for final peritoneal fluid culture obtained from 3/1, no growth to date. 2. F/u with hand surgery Dr Leanora Cover early next week 3. F/u with cardiology at  [facility] on 3/8 4. F/u with nephrology, continue dialysis  5. F/u with pain management at [facility] 6. Home health RN for wound care and medication management 7. Outpatient palliative care referrral     Discharge Diagnoses:  Active Hospital Problems   Diagnosis Date Noted  . Tylenol overdose 09/07/2020  . Leukocytosis 01/19/2020  . ESRD on dialysis ([place]) 07/04/2017  . Hyperkalemia 05/22/2017  . COPD GOLD 0 with flattening on inps f/v  09/27/2016  . Essential hypertension 09/27/2016  . Chronic hepatitis B ([place]) 03/05/2014    Resolved Hospital Problems  No resolved problems to display.    Discharge Condition: stable  Diet recommendation: Renal dialysis diet  Filed Weights   09/12/20 0333 09/12/20 0740 09/12/20 1026  Weight: 72.7 kg 71.3 kg 69.5 kg    History of present illness:  H/o ESRD on HD M/W/F, CAD s/p stenting,PAF with RVR,  pulmonary HTN, COPD, tobacco use, polysubstance abuse ( cocaine/THC) Hep B/C, Cirrhosis with recurrent ascites, , anemia of chronic disease who had recent amputation of 2 fingers of his right  hand(2/24 by Fredna Dow) presents to the ER on 2/27 after patient had taken increased dose of Tylenol which may be amounting to around 48,000 g. Patient also has been having increasing pain and had ran out of his narcotics. He had missed his dialysis  (last dialysis prior to admission was on 2/23).    He was started on NAC bolus followed by infusion he was hypotensive, hypoglycemic, and hyperkalemic with EKG changes  on presentation, Was going to get dialysis but developed wide complex tachycardia with worsening hypotension, PCCM consulted, he was started on Levophed and admitted to ICU on February 27, he improved and transferred to hospitalist service on March 3.  Hospital Course:  Principal Problem:   Tylenol overdose Active Problems:   Chronic hepatitis B (H[facility])   COPD GOLD 0 with flattening on inps f/v    Essential hypertension   Hyperkalemia   ESRD on dialysis ([place])   Leukocytosis  Acute metabolic encephalopathy present on admission likely multifactorial due to missing dialysis, electrolyte abnormality, hypoglycemia,  hypotension, tylenol over dose, possible hepatic encephalopathy Resolved   Tylenol overdose Finished NAC infusion, monitor Tylenol level 18 on presentation, LFT improved, INR normalized Though he appears to have Tylenol overdose in April 2021 with Tylenol level at 52 at a time. He denies suicidal ideation, psychiatry consulted cleared him, also recommended outpatient psychiatry follow up as needed and outpatient palliative care referrral.  It appeared he followed by [facility] in the past, will ask case manager assistance.  Shock requiring Levophed for two-three days then midodrine  Shock was thought due to hypovolemia, he did have leukocytosis on presentation, is treated with antibiotic for possible finger infection -Blood culture no growth -Chest x-ray on presentation no acute findings -Peritoneal fluids neutrophil count 125 ( 465x27%), peritoneal fluids Gram stain no organism seen, culture no growth for 3 days -He was treated with vancomycin and  cefepime on presentation, antibiotic changed to Rocephin, leukocytosis improving, he denies ab pain,   Hypertension  home meds metoprolol and Cardizem held  due to shock, now hypertension, will resume home  meds F/u with pcp  Right index and ring finger ischemia and necrosis s/p amputation 2/24 -on rocephin for possible finger infection  -ortho Dr. Fredna Dow and associate input appreciated, he is cleared to go home for ortho standpoint, recommend soap and water and dry dressing over clean finger amputation sites, patient to follow up with Dr. Fredna Dow  early next week ,empiric antibiotic until next week Home health RN arranged for wound care  ESRD, missed iHD PTA, Hyperkalemia with EKG changes Known history of noncompliance with HD Management per nephrology  Hx Afib with RVR(not AC candidate given noncompliance) -On metoprolol, Cardizem and amiodarone at home  -Now off metoprolol and Cardizem due to hypotension , resumed at discharge after hypotension resolved -Resume amiodarone PO -F/u with cardiology   Hx Cirrhosis/ chronic hep B/C h/o alcohol use Recurrent Ascites with frequent paracentesis ( used to be weekly) -s/p paracentesis 3L removedon 3/1 -Peritoneal fluids neutrophil count 125 ( 465x27%), peritoneal fluids Gram stain no organism seen, culture no growth for 3 days, he was treated with broad-spectrum antibiotic in the hospital -Appears to be on Bactrim daily for SBP prophylaxis, resume at discharge  Chronic Hep B Appear to be on tenofovir weekly  Hepc  Appear has cleared since 2018  Nutritional Assessment: The patient's BMI is: Body mass index is 23.15 kg/m.Marland Kitchen Seen by dietician.  I agree with the assessment and plan as outlined below: Nutrition Status: Nutrition Problem: Moderate Malnutrition Etiology: chronic illness Signs/Symptoms: mild fat depletion,severe fat depletion Interventions: Nepro shake  . DVT prophylaxis while in the hospital: SCDs Start: 09/07/20 2102   Code Status: Full, although per chart review he was seen by palliative care on multiple occasions, at that time he was DNR, he is not in a mood to talk about it, he just wants to go  home Family Communication: Patient Disposition:    The patient is from: Home by himself, States he plans to live with a friend after discharge Home health arranged    Consultants:   Critical care  Nephrology  Psychiatry  Hand surgery  Procedures:   Hemodialysis  Antimicrobials:   2/27 cefepime >>off 2/27 vanc >>off 3/2 ceftriaxone   Discharge Exam: BP (!) 139/106 (BP Location: Left Leg)   Pulse 68   Temp (!) 97.5 F (36.4 C) (Oral)   Resp 18   Ht 5\' 9"  (1.753 m)   Wt 69.5 kg   SpO2 96%   BMI 22.63 kg/m   General: NAD, AAOX3, ambulating in room Cardiovascular: RRR Respiratory: Normal respiratory effort  Discharge Instructions You were cared for by a hospitalist during your hospital stay. If you have any questions about your discharge medications or the care you received while you were in the hospital after you are discharged, you can call the unit and asked to speak with the hospitalist on call if the hospitalist that took care of you is not available. Once you are discharged, your primary care physician will handle any further medical issues. Please note that NO REFILLS for any discharge medications will be authorized once you are discharged, as it is imperative that you return to your primary care physician (or establish a relationship with a primary care physician if you do not have one) for your aftercare needs so that they can reassess your need for medications  and monitor your lab values.  Discharge Instructions    Diet general   Complete by: As directed    Renal diet   Discharge wound care:   Complete by: As directed    Clean finger with soap and water, dry dressing over fresh finger amputation sites, change daily   Increase activity slowly   Complete by: As directed      Allergies as of 09/12/2020      Reactions   Tramadol Itching, Other (See Comments)   Other reaction(s): Unknown   Grass Extracts [gramineae Pollens]     Other reaction(s): Sneezing   Morphine And Related Other (See Comments)   Stomach pain   Pollen Extract Other (See Comments)   Other reaction(s): Sneezing (finding)   Acetaminophen Nausea Only   Stomach ache   Aspirin Itching, Other (See Comments)   STOMACH PAIN Other reaction(s): Unknown   Clonidine Derivatives Itching      Medication List    TAKE these medications   albuterol 108 (90 Base) MCG/ACT inhaler Commonly known as: VENTOLIN HFA Inhale 2 puffs into the lungs every 4 (four) hours as needed for wheezing or shortness of breath.   amiodarone 200 MG tablet Commonly known as: PACERONE Take 1 tablet (200 mg total) by mouth daily.   cetirizine 10 MG tablet Commonly known as: ZYRTEC Take 10 mg by mouth daily as needed for allergies.   cinacalcet 30 MG tablet Commonly known as: SENSIPAR Take 2 tablets (60 mg total) by mouth every Monday, Wednesday, and Friday with hemodialysis. Start taking on: September 15, 2020   diltiazem 120 MG 24 hr capsule Commonly known as: CARDIZEM CD TAKE 1 CAPSULE BY MOUTH EVERY DAY What changed: how much to take   feeding supplement (NEPRO CARB STEADY) Liqd Take 237 mLs by mouth 3 (three) times daily between meals.   gabapentin 300 MG capsule Commonly known as: NEURONTIN Take 1 capsule (300 mg total) by mouth at bedtime. What changed: when to take this   lactulose 10 GM/15ML solution Commonly known as: CHRONULAC Take 30 mLs (20 g total) by mouth daily.   metoprolol succinate 25 MG 24 hr tablet Commonly known as: Toprol XL Take 1 tablet (25 mg total) by mouth daily.   multivitamin Tabs tablet Take 1 tablet by mouth at bedtime.   ondansetron 4 MG tablet Commonly known as: ZOFRAN TAKE 1 TABLET BY MOUTH EVERY 8 HOURS AS NEEDED FOR NAUSEA AND VOMITING What changed: See the new instructions.   Oxycodone HCl 10 MG Tabs Take 1 tablet (10 mg total) by mouth 3 (three) times daily as needed (pain).   pantoprazole 40 MG tablet Commonly  known as: PROTONIX Take 1 tablet (40 mg total) by mouth 2 (two) times daily.   sevelamer carbonate 800 MG tablet Commonly known as: RENVELA Take 2,400 mg by mouth See admin instructions. Take 3 tablets (2400 mg) by mouth up to three times daily with meals   sulfamethoxazole-trimethoprim 800-160 MG tablet Commonly known as: BACTRIM DS Take 1 tablet by mouth daily.   tenofovir 300 MG tablet Commonly known as: Viread Take 1 by mouth every Monday. What changed:   how much to take  how to take this  when to take this  additional instructions            Discharge Care Instructions  (From admission, onward)         Start     Ordered   09/12/20 0000  Discharge wound care:  Comments: Clean finger with soap and water, dry dressing over fresh finger amputation sites, change daily   09/12/20 1434         Allergies  Allergen Reactions  . Tramadol Itching and Other (See Comments)    Other reaction(s): Unknown  . Grass Extracts [Gramineae Pollens]     Other reaction(s): Sneezing  . Morphine And Related Other (See Comments)    Stomach pain  . Pollen Extract Other (See Comments)    Other reaction(s): Sneezing (finding)  . Acetaminophen Nausea Only    Stomach ache   . Aspirin Itching and Other (See Comments)    STOMACH PAIN  Other reaction(s): Unknown  . Clonidine Derivatives Itching    Follow-up Information    [facility] Follow up.   Why: Registered Nurse-office to call with a visit time.  Contact information: [place] [address] [place] [address] [facility] Follow up on 09/25/2020.   Why: @ 10:30 am with Dr. Margarita Rana for hospital follow up appointment. If you cannot make this scheduled appointment please call the office to reschedule.  Contact information: [place] [address] [phone]       Leanora Cover, MD. Schedule an appointment as soon as  possible for a visit in 1 week(s).   Specialty: Orthopedic Surgery Why: make appointment early next week for finger wound check Contact information: [address] [place] [address] [phone]        Corena Pilgrim, MD Follow up in 3 week(s).   Specialty: Psychiatry Why: for mood management  Contact information: [place] [address] [phone]        Vonita Moss, NP Follow up.   Specialty: Nurse Practitioner Why: at [facility] for pain management  Contact information: [address] [place] [address] [phone]                The results of significant diagnostics from this hospitalization (including imaging, microbiology, ancillary and laboratory) are listed below for reference.    Significant Diagnostic Studies: DG Chest Portable 1 View  Result Date: 09/07/2020 CLINICAL DATA:  Overdose. EXAM: PORTABLE CHEST 1 VIEW COMPARISON:  June 30, 2020. FINDINGS: Stable cardiomegaly. No pneumothorax or pleural effusion is noted. Both lungs are clear. The visualized skeletal structures are unremarkable. IMPRESSION: No active disease. Electronically Signed   By: Marijo Conception M.D.   On: 09/07/2020 17:38    Microbiology: Recent Results (from the past 240 hour(s))  SARS CORONAVIRUS 2 (TAT 6-24 HRS) Nasopharyngeal Nasopharyngeal Swab     Status: None   Collection Time: 09/03/20  9:56 AM   Specimen: Nasopharyngeal Swab  Result Value Ref Range Status   SARS Coronavirus 2 NEGATIVE NEGATIVE Final    Comment: (NOTE) SARS-CoV-2 target nucleic acids are NOT DETECTED.  The SARS-CoV-2 RNA is generally detectable in upper and lower respiratory specimens during the acute phase of infection. Negative results do not preclude SARS-CoV-2 infection, do not rule out co-infections with other pathogens, and should not be used as the sole basis for treatment or other patient management decisions. Negative results must be combined with clinical  observations, patient history, and epidemiological information. The expected result is Negative.  Fact Sheet for Patients: SugarRoll.be  Fact Sheet for Healthcare Providers: https://www.woods-mathews.com/  This test is not yet approved or cleared by the [place] FDA and  has been authorized for detection and/or  diagnosis of SARS-CoV-2 by FDA under an Emergency Use Authorization (EUA). This EUA will remain  in effect (meaning this test can be used) for the duration of the COVID-19 declaration under Se ction 564(b)(1) of the Act, 21 U.S.C. section 360bbb-3(b)(1), unless the authorization is terminated or revoked sooner.  Performed at [facility], [place] [address], [place], [place] [address]   Resp Panel by RT-PCR (Flu A&B, Covid) Nasopharyngeal Swab     Status: None   Collection Time: 09/07/20  6:35 PM   Specimen: Nasopharyngeal Swab; Nasopharyngeal(NP) swabs in vial transport medium  Result Value Ref Range Status   SARS Coronavirus 2 by RT PCR NEGATIVE NEGATIVE Final    Comment: (NOTE) SARS-CoV-2 target nucleic acids are NOT DETECTED.  The SARS-CoV-2 RNA is generally detectable in upper respiratory specimens during the acute phase of infection. The lowest concentration of SARS-CoV-2 viral copies this assay can detect is 138 copies/mL. A negative result does not preclude SARS-Cov-2 infection and should not be used as the sole basis for treatment or other patient management decisions. A negative result may occur with  improper specimen collection/handling, submission of specimen other than nasopharyngeal swab, presence of viral mutation(s) within the areas targeted by this assay, and inadequate number of viral copies(<138 copies/mL). A negative result must be combined with clinical observations, patient history, and epidemiological information. The expected result is Negative.  Fact Sheet for Patients:   EntrepreneurPulse.com.au  Fact Sheet for Healthcare Providers:  IncredibleEmployment.be  This test is no t yet approved or cleared by the [place] FDA and  has been authorized for detection and/or diagnosis of SARS-CoV-2 by FDA under an Emergency Use Authorization (EUA). This EUA will remain  in effect (meaning this test can be used) for the duration of the COVID-19 declaration under Section 564(b)(1) of the Act, 21 U.S.C.section 360bbb-3(b)(1), unless the authorization is terminated  or revoked sooner.       Influenza A by PCR NEGATIVE NEGATIVE Final   Influenza B by PCR NEGATIVE NEGATIVE Final    Comment: (NOTE) The Xpert Xpress SARS-CoV-2/FLU/RSV plus assay is intended as an aid in the diagnosis of influenza from Nasopharyngeal swab specimens and should not be used as a sole basis for treatment. Nasal washings and aspirates are unacceptable for Xpert Xpress SARS-CoV-2/FLU/RSV testing.  Fact Sheet for Patients: EntrepreneurPulse.com.au  Fact Sheet for Healthcare Providers: IncredibleEmployment.be  This test is not yet approved or cleared by the [place] FDA and has been authorized for detection and/or diagnosis of SARS-CoV-2 by FDA under an Emergency Use Authorization (EUA). This EUA will remain in effect (meaning this test can be used) for the duration of the COVID-19 declaration under Section 564(b)(1) of the Act, 21 U.S.C. section 360bbb-3(b)(1), unless the authorization is terminated or revoked.  Performed at [facility], [place] [address] [place], [place] [address]   Culture, blood (routine x 2)     Status: None   Collection Time: 09/07/20 10:46 PM   Specimen: BLOOD LEFT HAND  Result Value Ref Range Status   Specimen Description BLOOD LEFT HAND  Final   Special Requests   Final    BOTTLES DRAWN AEROBIC AND ANAEROBIC Blood Culture adequate volume   Culture   Final    NO GROWTH  5 DAYS Performed at [facility], [place] [address] [place], [place] [address]    Report Status 09/12/2020 FINAL  Final  Culture, blood (routine x 2)     Status: None  Collection Time: 09/07/20 10:47 PM   Specimen: BLOOD LEFT HAND  Result Value Ref Range Status   Specimen Description BLOOD LEFT HAND  Final   Special Requests   Final    BOTTLES DRAWN AEROBIC ONLY Blood Culture adequate volume   Culture   Final    NO GROWTH 5 DAYS Performed at [facility], [address] [place], [place] [address]    Report Status 09/12/2020 FINAL  Final  MRSA PCR Screening     Status: None   Collection Time: 09/07/20 10:51 PM   Specimen: Nasopharyngeal  Result Value Ref Range Status   MRSA by PCR NEGATIVE NEGATIVE Final    Comment:        The GeneXpert MRSA Assay (FDA approved for NASAL specimens only), is one component of a comprehensive MRSA colonization surveillance program. It is not intended to diagnose MRSA infection nor to guide or monitor treatment for MRSA infections. Performed at [facility], [place] [address] [place], [place] [address]   Body fluid culture w Gram Stain     Status: None (Preliminary result)   Collection Time: 09/09/20  3:38 PM   Specimen: Body Fluid  Result Value Ref Range Status   Specimen Description FLUID PERITONEAL  Final   Special Requests PERITONEAL FLUID  Final   Gram Stain   Final    MODERATE WBC PRESENT,BOTH PMN AND MONONUCLEAR NO ORGANISMS SEEN    Culture   Final    NO GROWTH 3 DAYS Performed at [facility], [address], [place], [place] [address]    Report Status PENDING  Incomplete     Labs: Basic Metabolic Panel: Recent Labs  Lab 09/07/20 2340 09/08/20 0104 09/09/20 0304 09/10/20 1100 09/10/20 1559 09/11/20 0047 09/12/20 0339  NA 135   < > 135 135 137 135 132*  K 7.5*   < > 3.7 3.9 3.6 3.2* 3.9  CL 86*   < > 89* 88* 91* 92* 91*  CO2 <7*   < > 15* 17* 22 22 17*  GLUCOSE 61*   < > 133* 117* 106* 127* 117*   BUN 94*   < > 32* 38* 19 20 31*  CREATININE 15.01*   < > 8.05* 10.05* 6.17* 6.68* 8.28*  CALCIUM 9.3   < > 10.5* 10.3 10.4* 9.9 10.8*  MG 2.8*  --   --   --   --   --   --   PHOS  --   --   --   --  3.2  --   --    < > = values in this interval not displayed.   Liver Function Tests: Recent Labs  Lab 09/08/20 1641 09/09/20 0304 09/09/20 1747 09/10/20 0051 09/10/20 1559 09/11/20 0047 09/12/20 0339  AST 177* 178*  --  118*  --  81* 56*  ALT 58* 60*  --  41  --  28 19  ALKPHOS 219* 216*  --  190*  --  199* 231*  BILITOT 1.4* 1.4*  --  1.1  --  1.3* 1.4*  PROT 7.3 7.3  --  6.5  --  6.8 7.4  ALBUMIN 3.3* 3.4* 2.9* 2.8* 3.3* 3.3* 3.6   No results for input(s): LIPASE, AMYLASE in the last 168 hours. Recent Labs  Lab 09/08/20 0811  AMMONIA 41*   CBC: Recent Labs  Lab 09/07/20 1744 09/07/20 2340 09/08/20 0104 09/09/20 0304 09/10/20 1100 09/11/20 0047 09/12/20 0339 09/12/20 0759  WBC  16.3* 14.8*   < > 14.6* 13.7* 10.0 12.4* 11.5*  NEUTROABS 14.0* 12.6*  --   --   --   --   --   --   HGB 12.7* 11.9*   < > 10.6* 10.6* 10.2* 12.0* 11.1*  HCT 35.6* 32.8*   < > 28.9* RESULTS UNAVAILABLE DUE TO INTERFERING SUBSTANCE 25.7* RESULTS UNAVAILABLE DUE TO INTERFERING SUBSTANCE RESULTS UNAVAILABLE DUE TO INTERFERING SUBSTANCE  MCV 85.0 82.2   < > 80.3 RESULTS UNAVAILABLE DUE TO INTERFERING SUBSTANCE 76.7* RESULTS UNAVAILABLE DUE TO INTERFERING SUBSTANCE RESULTS UNAVAILABLE DUE TO INTERFERING SUBSTANCE  PLT 296 317   < > 189 162 140* 131* 133*   < > = values in this interval not displayed.   Cardiac Enzymes: No results for input(s): CKTOTAL, CKMB, CKMBINDEX, TROPONINI in the last 168 hours. BNP: BNP (last 3 results) Recent Labs    11/12/19 2341 01/14/20 1927 02/17/20 1208  BNP 3,672.9* 2,254.1* 1,372.3*    ProBNP (last 3 results) No results for input(s): PROBNP in the last 8760 hours.  CBG: Recent Labs  Lab 09/11/20 0753 09/11/20 1135 09/11/20 1604 09/11/20 2135  09/12/20 1153  GLUCAP 122* 112* 130* 98 112*       Signed:  Florencia Reasons MD, PhD, FACP  [facility] 09/12/2020, 3:12 PM

## 2020-09-12 NOTE — Evaluation (Signed)
Clinical/Bedside Swallow Evaluation Patient Details  Name: [name] MRN: [id number] Date of Birth: 27-May-1961  Today's Date: 09/12/2020 Time: SLP Start Time (ACUTE ONLY): 1141 SLP Stop Time (ACUTE ONLY): 1149 SLP Time Calculation (min) (ACUTE ONLY): 8 min  Past Medical History:  Past Medical History:  Diagnosis Date  . Anemia   . Anxiety   . Arthritis    left shoulder  . Atherosclerosis of aorta ([place])   . Cardiomegaly   . Chest pain    DATE UNKNOWN, C/O PERIODICALLY  . Cocaine abuse ([place])   . COPD exacerbation ([place]) 08/17/2016  . Coronary artery disease    stent 02/22/17  . Dysrhythmia   . ESRD (end stage renal disease) on dialysis ([place])    "[name]; MWF" (07/04/2017)  . GERD (gastroesophageal reflux disease)    DATE UNKNOWN  . Hemorrhoids   . Hepatitis B, chronic ([place])   . Hepatitis C   . History of kidney stones   . Hyperkalemia   . Hypertension   . Kidney failure   . Metabolic bone disease    Patient denies  . Mitral stenosis   . Myocardial infarction ([place])   . Pneumonia   . Pulmonary edema   . Solitary rectal ulcer syndrome 07/2017   at flex sig for rectal bleeding  . Tubular adenoma of colon    Past Surgical History:  Past Surgical History:  Procedure Laterality Date  . A/V FISTULAGRAM Left 05/26/2017   Procedure: A/V FISTULAGRAM;  Surgeon: Conrad Bennington, MD;  Location: [facility];  Service: Cardiovascular;  Laterality: Left;  . A/V FISTULAGRAM Right 11/18/2017   Procedure: A/V FISTULAGRAM - Right Arm;  Surgeon: Elam Dutch, MD;  Location: [facility];  Service: Cardiovascular;  Laterality: Right;  . AMPUTATION Right 09/04/2020   Procedure: RIGHT INDEX AND RIGHT RING FINGER AMPUTATION DIGIT;  Surgeon: Leanora Cover, MD;  Location: [facility];  Service: Orthopedics;  Laterality: Right;  . APPLICATION OF WOUND VAC Left 06/14/2017   Procedure: APPLICATION OF WOUND VAC;  Surgeon: Katha Cabal, MD;  Location: [facility];  Service:  Vascular;  Laterality: Left;  . AV FISTULA PLACEMENT  2012   BELIEVED WAS PLACED IN JUNE  . AV FISTULA PLACEMENT Right 08/09/2017   Procedure: Creation Right arm ARTERIOVENOUS BRACHIOCEPOHALIC FISTULA;  Surgeon: Elam Dutch, MD;  Location: [facility] OR;  Service: Vascular;  Laterality: Right;  . AV FISTULA PLACEMENT Right 11/22/2017   Procedure: INSERTION OF ARTERIOVENOUS (AV) GORE-TEX GRAFT RIGHT UPPER ARM;  Surgeon: Elam Dutch, MD;  Location: [facility];  Service: Vascular;  Laterality: Right;  . BIOPSY  01/25/2018   Procedure: BIOPSY;  Surgeon: Jerene Bears, MD;  Location: [facility];  Service: Gastroenterology;;  . BIOPSY  04/10/2019   Procedure: BIOPSY;  Surgeon: Jerene Bears, MD;  [facility];  Service: Gastroenterology;;  . COLONOSCOPY    . COLONOSCOPY WITH PROPOFOL N/A 01/25/2018   Procedure: COLONOSCOPY WITH PROPOFOL;  Surgeon: Jerene Bears, MD;  Location: [facility];  Service: Gastroenterology;  Laterality: N/A;  . CORONARY STENT INTERVENTION N/A 02/22/2017   Procedure: CORONARY STENT INTERVENTION;  Surgeon: Nigel Mormon, MD;  Location: [facility];  Service: Cardiovascular;  Laterality: N/A;  . ESOPHAGOGASTRODUODENOSCOPY (EGD) WITH PROPOFOL N/A 01/25/2018   Procedure: ESOPHAGOGASTRODUODENOSCOPY (EGD) WITH PROPOFOL;  Surgeon: Jerene Bears, MD;  Location: [facility];  Service: Gastroenterology;  Laterality: N/A;  . ESOPHAGOGASTRODUODENOSCOPY (EGD) WITH PROPOFOL N/A 04/10/2019  Procedure: ESOPHAGOGASTRODUODENOSCOPY (EGD) WITH PROPOFOL;  Surgeon: Jerene Bears, MD;  [facility];  Service: Gastroenterology;  Laterality: N/A;  . FLEXIBLE SIGMOIDOSCOPY N/A 07/15/2017   Procedure: FLEXIBLE SIGMOIDOSCOPY;  Surgeon: Carol Ada, MD;  Location: [facility];  Service: Endoscopy;  Laterality: N/A;  . HEMORRHOID BANDING    . I & D EXTREMITY Left 06/01/2017   Procedure: IRRIGATION AND DEBRIDEMENT LEFT ARM HEMATOMA WITH LIGATION OF LEFT ARM AV FISTULA;   Surgeon: Elam Dutch, MD;  Location: [facility];  Service: Vascular;  Laterality: Left;  . I & D EXTREMITY Left 06/14/2017   Procedure: IRRIGATION AND DEBRIDEMENT EXTREMITY;  Surgeon: Katha Cabal, MD;  Location: [facility];  Service: Vascular;  Laterality: Left;  . INSERTION OF DIALYSIS CATHETER  05/30/2017  . INSERTION OF DIALYSIS CATHETER N/A 05/30/2017   Procedure: INSERTION OF DIALYSIS CATHETER;  Surgeon: Elam Dutch, MD;  Location: [facility];  Service: Vascular;  Laterality: N/A;  . IR PARACENTESIS  08/30/2017  . IR PARACENTESIS  09/29/2017  . IR PARACENTESIS  10/28/2017  . IR PARACENTESIS  11/09/2017  . IR PARACENTESIS  11/16/2017  . IR PARACENTESIS  11/28/2017  . IR PARACENTESIS  12/01/2017  . IR PARACENTESIS  12/06/2017  . IR PARACENTESIS  01/03/2018  . IR PARACENTESIS  01/23/2018  . IR PARACENTESIS  02/07/2018  . IR PARACENTESIS  02/21/2018  . IR PARACENTESIS  03/06/2018  . IR PARACENTESIS  03/17/2018  . IR PARACENTESIS  04/04/2018  . IR PARACENTESIS  12/28/2018  . IR PARACENTESIS  01/08/2019  . IR PARACENTESIS  01/23/2019  . IR PARACENTESIS  02/01/2019  . IR PARACENTESIS  02/19/2019  . IR PARACENTESIS  03/01/2019  . IR PARACENTESIS  03/15/2019  . IR PARACENTESIS  04/03/2019  . IR PARACENTESIS  04/12/2019  . IR PARACENTESIS  05/01/2019  . IR PARACENTESIS  05/08/2019  . IR PARACENTESIS  05/24/2019  . IR PARACENTESIS  06/12/2019  . IR PARACENTESIS  07/09/2019  . IR PARACENTESIS  07/27/2019  . IR PARACENTESIS  08/09/2019  . IR PARACENTESIS  08/21/2019  . IR PARACENTESIS  09/17/2019  . IR PARACENTESIS  10/05/2019  . IR PARACENTESIS  10/29/2019  . IR PARACENTESIS  11/08/2019  . IR PARACENTESIS  12/12/2019  . IR PARACENTESIS  01/03/2020  . IR PARACENTESIS  01/10/2020  . IR PARACENTESIS  01/17/2020  . IR PARACENTESIS  01/24/2020  . IR PARACENTESIS  01/31/2020  . IR PARACENTESIS  02/07/2020  . IR PARACENTESIS  02/21/2020  . IR PARACENTESIS  05/21/2020  . IR RADIOLOGIST EVAL & MGMT  02/14/2018  . IR  RADIOLOGIST EVAL & MGMT  02/22/2019  . LEFT HEART CATH AND CORONARY ANGIOGRAPHY N/A 02/22/2017   Procedure: LEFT HEART CATH AND CORONARY ANGIOGRAPHY;  Surgeon: Nigel Mormon, MD;  Location: [facility];  Service: Cardiovascular;  Laterality: N/A;  . LIGATION OF ARTERIOVENOUS  FISTULA Left 11/09/256   Procedure: Plication of Left Arm Arteriovenous Fistula;  Surgeon: Elam Dutch, MD;  Location: [facility];  Service: Vascular;  Laterality: Left;  . POLYPECTOMY    . POLYPECTOMY  01/25/2018   Procedure: POLYPECTOMY;  Surgeon: Jerene Bears, MD;  Location: [facility];  Service: Gastroenterology;;  . REVISON OF ARTERIOVENOUS FISTULA Left 12/06/7822   Procedure: PLICATION OF DISTAL ANEURYSMAL SEGEMENT OF LEFT UPPER ARM ARTERIOVENOUS FISTULA;  Surgeon: Elam Dutch, MD;  Location: [place];  Service: Vascular;  Laterality: Left;  . REVISON OF ARTERIOVENOUS FISTULA Left 07/24/2016   Procedure:  Plication of Left Upper Arm Fistula ;  Surgeon: Waynetta Sandy, MD;  Location: [place];  Service: Vascular;  Laterality: Left;  . SKIN GRAFT SPLIT THICKNESS LEG / FOOT Left    SKIN GRAFT SPLIT THICKNESS LEFT ARM DONOR SITE: LEFT ANTERIOR THIGH  . SKIN SPLIT GRAFT Left 07/04/2017   Procedure: SKIN GRAFT SPLIT THICKNESS LEFT ARM DONOR SITE: LEFT ANTERIOR THIGH;  Surgeon: Elam Dutch, MD;  Location: [facility];  Service: Vascular;  Laterality: Left;  . THROMBECTOMY W/ EMBOLECTOMY Left 06/05/2017   Procedure: EXPLORATION OF LEFT ARM FOR BLEEDING; OVERSEWED PROXIMAL FISTULA;  Surgeon: Angelia Mould, MD;  Location: [place];  Service: Vascular;  Laterality: Left;  . WOUND EXPLORATION Left 06/03/2017   Procedure: WOUND EXPLORATION WITH WOUND VAC APPLICATION TO LEFT ARM;  Surgeon: Angelia Mould, MD;  Location: [facility] OR;  Service: Vascular;  Laterality: Left;   HPI:  Pt is an 60 y.o. male with ESRD and tylenol overdose. Pt recently had surgery on 2/24 for some necrotic fingers and had partial  amputations of the right hand. CXR 2/27 negative. PMH includes PNA, moyocardial infarction, HTN, GERD, ESRD, COPD, PE.   Assessment / Plan / Recommendation Clinical Impression  Pt appears to have an oropharyngeal swallow that is [facility], with no overt s/s of aspiration noted with limited PO trials. Only thin liquids were assessed due to pt's unwillingness to eat solids during today's assessment despite verbal cues and encouragment from SLP. Pt reports that he did not have a modified diet or any difficulty swallowing PTA. He was also noted to have a strong vocal quality as well as a cleared mentation that may be more consistent with his baseline. Recomend that the pt remain on a regular diet with thin liquids and SLP will sign off at this time - pt politefully declined the need for f/u up.  SLP Visit Diagnosis: Dysphagia, unspecified (R13.10)    Aspiration Risk  Mild aspiration risk    Diet Recommendation Regular;Thin liquid   Liquid Administration via: Cup;Straw Medication Administration: Whole meds with liquid Supervision: Patient able to self feed;Intermittent supervision to cue for compensatory strategies Compensations: Slow rate Postural Changes: Seated upright at 90 degrees;Remain upright for at least 30 minutes after po intake    Other  Recommendations Oral Care Recommendations: Oral care BID   Follow up Recommendations        Frequency and Duration            Prognosis Prognosis for Safe Diet Advancement: Good      Swallow Study   General HPI: Pt is an 60 y.o. male with ESRD and tylenol overdose. Pt recently had surgery on 2/24 for some necrotic fingers and had partial amputations of the right hand. CXR 2/27 negative. PMH includes PNA, moyocardial infarction, HTN, GERD, ESRD, COPD, PE. Type of Study: Bedside Swallow Evaluation Previous Swallow Assessment: None in chart Diet Prior to this Study: Regular;Thin liquids Temperature Spikes Noted: No Respiratory Status: Room  air History of Recent Intubation: Yes Length of Intubations (days):  (For surgery) Date extubated: 09/04/20 Behavior/Cognition: Alert;Requires cueing Oral Cavity Assessment: Within Functional Limits Oral Care Completed by SLP: No Oral Cavity - Dentition: Missing dentition Vision: Functional for self-feeding Self-Feeding Abilities: Able to feed self Patient Positioning: Upright in bed Baseline Vocal Quality: Normal    Oral/Motor/Sensory Function Overall Oral Motor/Sensory Function: Within functional limits (Some not able to assess due to lack of participation; good lingual and facial symmetry)   Ice Chips Ice  chips: Not tested   Thin Liquid Thin Liquid: Within functional limits Presentation: Self Fed;Straw    Nectar Thick Nectar Thick Liquid: Not tested   Honey Thick Honey Thick Liquid: Not tested   Puree Puree: Not tested   Solid     Solid: Not tested      Jeanine Luz., SLP Student 09/12/2020,12:31 PM

## 2020-09-12 NOTE — TOC Initial Note (Addendum)
Transition of Care [facility]) - Initial/Assessment Note    Patient Details  Name: [name] MRN: [id number] Date of Birth: 11/05/1960  Transition of [facility] CM/SW Contact:    Bethena Roys, RN Phone Number: 09/12/2020, 2:27 PM  Clinical Narrative: Risk for readmission assessment completed. Patient presented due to a tylenol overdose had a previous surgery. Prior to arrival patient was from home alone. Hx of ESRD on HD MWF schedule. Patient has daily aide services via [facility] and is agreeable to [facility] RN for disease management and education. Patient did not have a preference regarding agency-just to be in network. MD is aware to place [facility] Orders in [place]. Case Manager did call [facility]- and referral was accepted with start of care beginning next Tuesday. Hospital follow up appointment scheduled and information was placed on the AVS. No further needs from Case Manager at this time.                  1600 09-12-20 Late Entry: Case Manager received a referral for outpatient palliative care- Call placed to [facility] for services. Palliative will call the patient to establish an appointment. No further needs from Case Manager at this time.   Expected Discharge Plan: [place] Barriers to Discharge: No Barriers Identified   Patient Goals and CMS Choice Patient states their goals for this hospitalization and ongoing recovery are:: to return home.   Choice offered to / list presented to : NA (Pt did not have a preference on agency choice just to be in network)  Expected Discharge Plan and Services Expected Discharge Plan: [place]   Discharge Planning Services: CM Consult Post Acute Care Choice: [place] arrangements for the past 2 months: Apartment                 DME Arranged: N/A DME Agency: NA       HH Arranged: RN [place] Agency: [place] Date [place]: 09/12/20 Time HH Agency  Contacted: 81 Representative spoke with at [place]: [name]  Prior Living Arrangements/Services Living arrangements for the past 2 months: Apartment Lives with:: Self Patient language and need for interpreter reviewed:: Yes        Need for Family Participation in Patient Care: Yes (Comment) Care giver support system in place?: Yes (comment)   Criminal Activity/Legal Involvement Pertinent to Current Situation/Hospitalization: No - Comment as needed  Activities of Daily Living      Permission Sought/Granted Permission sought to share information with : Case Manager,Facility Contact Representative,Family Supports Permission granted to share information with : Yes, Verbal Permission Granted     Permission granted to share info w AGENCY: [facility]        Emotional Assessment Appearance:: Appears stated age Attitude/Demeanor/Rapport: Engaged Affect (typically observed): Appropriate Orientation: : Oriented to Situation,Oriented to Place,Oriented to Self,Oriented to  Time Alcohol / Substance Use: Not Applicable Psych Involvement: No (comment)  Admission diagnosis:  Tylenol overdose [T39.1X1A] Accidental acetaminophen overdose, initial encounter [T39.1X1A] Patient Active Problem List   Diagnosis Date Noted  . Tylenol overdose 09/07/2020  . DNR (do not resuscitate)   . CAP (community acquired pneumonia) 02/09/2020  . Leukocytosis 01/19/2020  . Tobacco use 01/19/2020  . Acute pulmonary edema ([place]) 12/21/2019  . Acetaminophen overdose 10/27/2019  . ESRD (end stage renal disease) ([place])   . Goals of care, counseling/discussion   . Hypoxia 10/04/2019  . Advanced care planning/counseling discussion   .  Renal failure 09/26/2019  . Dyspnea 06/09/2019  . Acquired thrombophilia ([place]) 06/05/2019  . A-fib ([place]) 05/30/2019  . Atrial fibrillation with RVR ([place]) 05/29/2019  . Melena   . Pressure injury of skin 03/09/2019  . Abdominal distention   . Volume overload 12/28/2018  .  Sepsis ([place]) 09/12/2018  . Coronary artery disease involving native coronary artery of native heart without angina pectoris 03/11/2018  . Benign neoplasm of cecum   . Benign neoplasm of ascending colon   . Benign neoplasm of descending colon   . Benign neoplasm of rectum   . AF (paroxysmal atrial fibrillation) ([place]) 01/23/2018  . Hx of colonic polyps 01/20/2018  . End-stage renal disease on hemodialysis ([place]) 11/21/2017  . GERD (gastroesophageal reflux disease) 11/16/2017  . Decompensated hepatic cirrhosis ([place]) 11/15/2017  . Palliative care by specialist   . Hyponatremia 11/04/2017  . SBP (spontaneous bacterial peritonitis) ([place]) 10/30/2017  . Liver disease, chronic 10/30/2017  . SOB (shortness of breath)   . Abdominal pain 10/28/2017  . Upper airway cough syndrome with flattening on f/v loop 10/13/17 c/w vcd 10/17/2017  . Elevated diaphragm 10/13/2017  . Ileus ([place]) 09/29/2017  . QT prolongation 09/29/2017  . Malnutrition of moderate degree 09/29/2017  . Sinus congestion 09/03/2017  . Symptomatic anemia 09/02/2017  . Cirrhosis of liver with ascites ([place]) 09/02/2017  . Left bundle branch block 09/02/2017  . Mitral stenosis 09/02/2017  . Hematochezia 07/15/2017  . Wide-complex tachycardia ([place])   . Endotracheally intubated   . ESRD on dialysis ([place]) 07/04/2017  . CKD (chronic kidney disease) stage V requiring chronic dialysis ([place]) 06/18/2017  . History of Cocaine abuse ([place]) 06/18/2017  . Hypertension 06/18/2017  . Infection of AV graft for dialysis ([place]) 06/18/2017  . Anxiety 06/18/2017  . Anemia due to chronic kidney disease 06/18/2017  . Atypical atrial flutter ([place]) 06/18/2017  . Personality disorder ([place]) 06/13/2017  . Cellulitis 06/12/2017  . Adjustment disorder with mixed anxiety and depressed mood 06/10/2017  . Suicidal ideation 06/10/2017  . Arm wound, left, sequela 06/10/2017  . Dyspnea on exertion 05/29/2017  . Tachycardia 05/29/2017  . Hyperkalemia 05/22/2017   . Acute metabolic encephalopathy   . Anemia 04/23/2017  . Ascites 04/23/2017  . COPD (chronic obstructive pulmonary disease) ([place]) 04/23/2017  . Acute on chronic respiratory failure with hypoxia ([place]) 03/25/2017  . Arrhythmia 03/25/2017  . COPD GOLD 0 with flattening on inps f/v  09/27/2016  . Essential hypertension 09/27/2016  . Fluid overload 08/30/2016  . COPD exacerbation ([place]) 08/17/2016  . Hypertensive urgency 08/17/2016  . Problem with dialysis access ([place]) 07/23/2016  . Chronic hepatitis B ([place]) 03/05/2014  . Chronic hepatitis C without hepatic coma ([place]) 03/05/2014  . Internal hemorrhoids with bleeding, swelling and itching 03/05/2014  . Thrombocytopenia ([place]) 03/05/2014  . Chest pain 02/27/2014  . Alcohol abuse 04/14/2009  . Nicotine dependence, cigarettes, uncomplicated 71/24/5809  . GANGLION CYST 04/14/2009   PCP:  Charlott Rakes, MD Pharmacy:   [facility] [place], [place] - [address] [place] [address] Phone: [phone] Fax: [phone]  [id number] - [place], [place] [address] [place] [address] Phone: [phone] Fax: [phone]     Social Determinants of Health (SDOH) Interventions    Readmission Risk Interventions Readmission Risk Prevention Plan 09/12/2020 10/01/2019  Transportation Screening Complete Complete  Medication Review (RN Care Manager) Complete Complete  PCP or Specialist  appointment within 3-5 days of discharge (No Data) (No Data)  HRI or Home Care Consult - Complete  SW Recovery Care/Counseling Consult Complete -  Palliative Care Screening Not Applicable -  [place] Not Applicable Patient Refused  Some recent data might be hidden

## 2020-09-12 NOTE — Progress Notes (Signed)
[facility] ([facility])  Hospital Liaison RN note         Notified by TOC manager of patient/family request for [facility] Palliative services at home after discharge.              [facility] Palliative team will follow up with patient after discharge.         Please call with any hospice or palliative related questions.         Thank you for the opportunity to participate in this patient's care.     Chrislyn King, BSN, RN [facility] Hospital Liaison (listed on [facility] under Hospice/[facility])    [phone] (24h on call)    

## 2020-09-12 NOTE — Progress Notes (Signed)
Subjective:    Seen on HD-  Remembers me so MS is improved-  Wanting to sign off machine which is typical for him-  Pain is still an issue   Objective Vital signs in last 24 hours: Vitals:   09/12/20 0750 09/12/20 0800 09/12/20 0830 09/12/20 0900  BP: (!) 97/21 (!) 137/57 (!) 168/129 (!) 124/94  Pulse: 81 70 62 93  Resp:      Temp:      TempSrc:      SpO2:      Weight:      Height:       Weight change: 1.6 kg  Intake/Output Summary (Last 24 hours) at 09/12/2020 0935 Last data filed at 09/11/2020 2005 Gross per 24 hour  Intake 1137 ml  Output --  Net 1137 ml   OP HD: [place] MWF   4h  450/800  70kg  2/2 bath  Hep none  RUA AVG  - hect 1, sensipar 60   Assessment/ Plan: Pt is a 60 y.o. yo male with ESRD, cirrhosis, COPD, CAD who was admitted on 09/07/2020 with  missed HD and  tylenol OD in the setting of finger amputations/attempting pain control  Assessment/Plan: 1. Tylenol OD-  Acetylcysteine stopped , serial levels-  INR normal 2. ESRD -  Notorious non compliant with HD-  Missed Friday PTA as OP.  Hd done here early Mon AM, then Wednesday.  Today on schedule and keep on MWF 3. Anemia- not a major issue at present but dropping with elevated INR 4. Secondary hyperparathyroidism- should be on sensipar with HD, hold hectorol wth high calcium- also on renvela but was NPO so holding meds- able to resume sensipar but hold renvela-  Phos yest was 3.2! 5. HTN/volume-  BP low right now- supposed to be on metoprolol and dilt as OP ? -  Unclear if taking - now on amio.  Volume status seems OK-  Was getting IVF for sugar, now off.  Also now on midodrine  6. Hyperkalemia-  On admit-  emergent HD and corrected early AM Monday-  Due to non compliance with HD-  7. Confusion-  Low sugar, corrected -  ammonia level slightly high-  Supportive care.  Is angry at baseline but usually oriented.  He is better again today in this regard - asking about going home- usual home situation is less than ideal but I  would not suspect he would be up for anything drastic like placement-  May be approaching d/c ?  Louis Meckel    Labs: Basic Metabolic Panel: Recent Labs  Lab 09/10/20 1559 09/11/20 0047 09/12/20 0339  NA 137 135 132*  K 3.6 3.2* 3.9  CL 91* 92* 91*  CO2 22 22 17*  GLUCOSE 106* 127* 117*  BUN 19 20 31*  CREATININE 6.17* 6.68* 8.28*  CALCIUM 10.4* 9.9 10.8*  PHOS 3.2  --   --    Liver Function Tests: Recent Labs  Lab 09/10/20 0051 09/10/20 1559 09/11/20 0047 09/12/20 0339  AST 118*  --  81* 56*  ALT 41  --  28 19  ALKPHOS 190*  --  199* 231*  BILITOT 1.1  --  1.3* 1.4*  PROT 6.5  --  6.8 7.4  ALBUMIN 2.8* 3.3* 3.3* 3.6   No results for input(s): LIPASE, AMYLASE in the last 168 hours. Recent Labs  Lab 09/08/20 0811  AMMONIA 41*   CBC: Recent Labs  Lab 09/07/20 1744 09/07/20 2340 09/08/20 0104 09/09/20 0304 09/10/20  1100 09/11/20 0047 09/12/20 0339 09/12/20 0759  WBC 16.3* 14.8*   < > 14.6* 13.7* 10.0 12.4* 11.5*  NEUTROABS 14.0* 12.6*  --   --   --   --   --   --   HGB 12.7* 11.9*   < > 10.6* 10.6* 10.2* 12.0* 11.1*  HCT 35.6* 32.8*   < > 28.9* RESULTS UNAVAILABLE DUE TO INTERFERING SUBSTANCE 25.7* RESULTS UNAVAILABLE DUE TO INTERFERING SUBSTANCE RESULTS UNAVAILABLE DUE TO INTERFERING SUBSTANCE  MCV 85.0 82.2   < > 80.3 RESULTS UNAVAILABLE DUE TO INTERFERING SUBSTANCE 76.7* RESULTS UNAVAILABLE DUE TO INTERFERING SUBSTANCE RESULTS UNAVAILABLE DUE TO INTERFERING SUBSTANCE  PLT 296 317   < > 189 162 140* 131* 133*   < > = values in this interval not displayed.   Cardiac Enzymes: No results for input(s): CKTOTAL, CKMB, CKMBINDEX, TROPONINI in the last 168 hours. CBG: Recent Labs  Lab 09/11/20 0305 09/11/20 0753 09/11/20 1135 09/11/20 1604 09/11/20 2135  GLUCAP 129* 122* 112* 130* 98    Iron Studies: No results for input(s): IRON, TIBC, TRANSFERRIN, FERRITIN in the last 72 hours. Studies/Results: No results  found. Medications: Infusions: . sodium chloride    . sodium chloride    . sodium chloride    . sodium chloride    . cefTRIAXone (ROCEPHIN)  IV 1 g (09/11/20 1120)  . dextrose Stopped (09/09/20 1210)  . sodium chloride      Scheduled Medications: . fentaNYL      . sodium chloride   Intravenous Once  . amiodarone  200 mg Oral Daily  . Chlorhexidine Gluconate Cloth  6 each Topical Q0600  . cinacalcet  60 mg Oral Q M,W,F-HD  . feeding supplement (NEPRO CARB STEADY)  237 mL Oral TID BM  . gabapentin  300 mg Oral BID  . lactulose  20 g Oral BID  . mouth rinse  15 mL Mouth Rinse BID  . midodrine  5 mg Oral TID WC  . multivitamin  1 tablet Oral QHS  . sevelamer carbonate  3,200 mg Oral TID with meals    have reviewed scheduled and prn medications.  Physical Exam: General: more alert for me today  Heart: irreg Lungs: mostly clear  Abdomen: soft, non tender Extremities: min peripheral edema Dialysis Access: right upper arm AVG-  patent   09/12/2020,9:35 AM  LOS: 5 days

## 2020-09-13 ENCOUNTER — Telehealth: Payer: Self-pay | Admitting: Nephrology

## 2020-09-13 LAB — BODY FLUID CULTURE W GRAM STAIN: Culture: NO GROWTH

## 2020-09-13 IMAGING — US IR PARACENTESIS
1 series · 2 of 2 positions shown · non-contrast
Comparison: none

INDICATION: Patient with history of end-stage liver disease/cirrhosis, recurrent
ascites, end-stage renal disease, hepatitis B/C; request received
for therapeutic paracentesis.

[Series 1: ir (id) (id)/(id)/(id) ir · 2 of 2 slices shown]
[im 1/2]
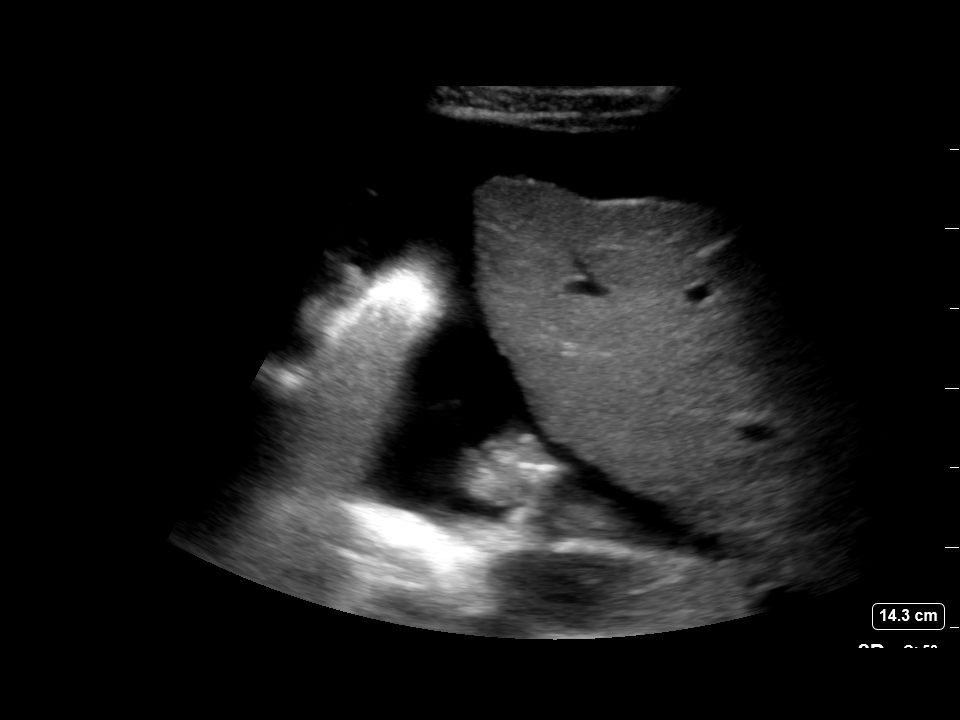
[im 2/2]
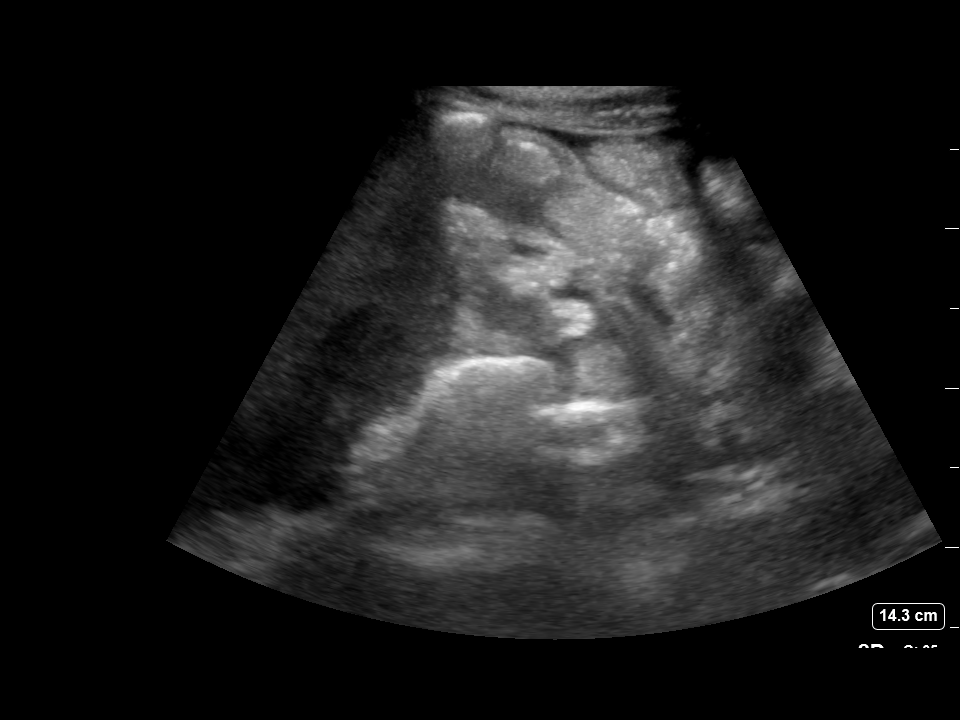

[2 of 2 positions shown; findings below may reference images not displayed]

EXAM:
ULTRASOUND GUIDED THERAPEUTIC PARACENTESIS

MEDICATIONS:
None

COMPLICATIONS:
None immediate.

PROCEDURE:
Informed written consent was obtained from the patient after a
discussion of the risks, benefits and alternatives to treatment. A
timeout was performed prior to the initiation of the procedure.

Initial ultrasound scanning demonstrates a large amount of ascites
within the right lower abdominal quadrant. The right lower abdomen
was prepped and draped in the usual sterile fashion. 1% lidocaine
was used for local anesthesia.

Following this, a 6 Fr Safe-T-Centesis catheter was introduced. An
ultrasound image was saved for documentation purposes. The
paracentesis was performed. The catheter was removed and a dressing
was applied. The patient tolerated the procedure well without
immediate post procedural complication.
FINDINGS: A total of approximately 4.1 liters of amber fluid was removed.
IMPRESSION: Successful ultrasound-guided therapeutic paracentesis yielding
liters of peritoneal fluid.

## 2020-09-13 NOTE — Telephone Encounter (Signed)
Transition of care contact from inpatient facility  Date of discharge: 09/12/20 Date of contact: 09/13/20 Method: Phone Spoke to: Patient  Patient contacted to discuss transition of care from recent inpatient hospitalization. Patient was admitted to [facility] from 02/27-03/04/22 with discharge diagnosis of Tylenol OD,shock , Hyperkalemia  Medication changes were reviewed.  Patient will follow up with his/her outpatient HD unit on: 09/15/20

## 2020-09-14 ENCOUNTER — Other Ambulatory Visit: Payer: Self-pay | Admitting: Family Medicine

## 2020-09-14 NOTE — Telephone Encounter (Signed)
Requested Prescriptions  Pending Prescriptions Disp Refills  . albuterol (VENTOLIN HFA) 108 (90 Base) MCG/ACT inhaler [Pharmacy Med Name: ALBUTEROL HFA (PROAIR) INHALER] 8.5 each 2    Sig: INHALE 2 PUFFS INTO THE LUNGS EVERY 4 HOURS AS NEEDED FOR WHEEZING OR SHORTNESS OF BREATH.     Pulmonology:  Beta Agonists Failed - 09/14/2020  2:24 PM      Failed - One inhaler should last at least one month. If the patient is requesting refills earlier, contact the patient to check for uncontrolled symptoms.      Passed - Valid encounter within last 12 months    Recent Outpatient Visits          1 month ago Felon of finger of right hand   [place], Charlane Ferretti, MD   2 months ago Gross hematuria   [place], Charlane Ferretti, MD   2 months ago AF (paroxysmal atrial fibrillation) [facility])   [place], [place]      Future Appointments            In 2 days Alethia Berthold, [facility]   In 1 week Charlott Rakes, MD [place]

## 2020-09-15 NOTE — Progress Notes (Deleted)
Primary Physician/Referring:  Charlott Rakes, MD  Patient ID: [name], male    DOB: 01-14-1961, 60 y.o.   MRN: [id number]  No chief complaint on file.  HPI:    [name]  is a 60 y.o. male  with very complex medical problems including severe COPD and chronic cor pulmonale with pulmonary hypertension, coronary artery disease s/p stenting to mid RCA in 2018, paroxysmal atrial fibrillation with RVR, hypertensive cardiomyopathy, dietary and medication noncompliance, end-stage renal disease on hemodialysis, chronic hepatitis B and C with cirrhosis of the liver and ascites, tobacco use, smokes 1/2 ppd, h/o marijuana use and prior cocaine use quit in 2017. Not on anticoagulation due to cirrhosis of liver and compliance with Warfarin.   He has had multiple ER visits for A fib with RVR, near syncope, shortness of breath, acute diastolic heart failure and hyperkalemia.  He has also been in the hospital with acute respiratory distress and pulmonary edema due to missed dialysis.  Since last visit patient developed nonhealing wound to right index and ring fingers which developed ischemia and necrosis requiring surgical amputation.  Most recently he was hospitalized at [facility] from 09/07/2020-09/12/2020 for accidental acetaminophen overdose and subsequent shock, therefore his metoprolol Cardizem were held in the hospital, resumed on discharge.  Patient now presents for follow-up of atrial fibrillation and heart failure.***   ***  Patient was last seen in our office 05/02/2020 by Dr. Einar Gip he presents today for 6 week follow up of atrial fibrillation and hear failure. At last office visit switch from metoprolol tartrate to metoprolol succinate. Since his last office visit patient was hospitalized again 05/20/2020-05/22/2020 with atrial fibrillation with RVR, hyperkalemia, and dyspnea after missing dialysis. He again presented to [facility] ED on 06/30/2020 with dyspnea and chest pain,  was treated for A fib with RVR and hyperkalemia.   Patient reports that since his last hospitalization he has been feeling well.  Currently denies chest pain, palpitations, dizziness, syncope, near syncope, orthopnea, PND, leg swelling.  Patient continues to report chronic shortness of breath, which is stable.  Patient is unclear if he is taking cardiac medications as prescribed.  He states he is taking diltiazem, but is unsure if he is taking amiodarone and metoprolol succinate.  Of note since hospitalization patient has been adherent to his Monday/Wednesday/Friday dialysis schedule.   Past Medical History:  Diagnosis Date  . Anemia   . Anxiety   . Arthritis    left shoulder  . Atherosclerosis of aorta ([place])   . Cardiomegaly   . Chest pain    DATE UNKNOWN, C/O PERIODICALLY  . Cocaine abuse ([place])   . COPD exacerbation ([place]) 08/17/2016  . Coronary artery disease    stent 02/22/17  . Dysrhythmia   . ESRD (end stage renal disease) on dialysis ([place])    "[name]; MWF" (07/04/2017)  . GERD (gastroesophageal reflux disease)    DATE UNKNOWN  . Hemorrhoids   . Hepatitis B, chronic ([place])   . Hepatitis C   . History of kidney stones   . Hyperkalemia   . Hypertension   . Kidney failure   . Metabolic bone disease    Patient denies  . Mitral stenosis   . Myocardial infarction ([place])   . Pneumonia   . Pulmonary edema   . Solitary rectal ulcer syndrome 07/2017   at flex [facility] for rectal bleeding  . Tubular adenoma of colon    Past Surgical History:  Procedure Laterality Date  .  A/V FISTULAGRAM Left 05/26/2017   Procedure: A/V FISTULAGRAM;  Surgeon: Conrad Osceola, MD;  Location: [place] [facility];  Service: Cardiovascular;  Laterality: Left;  . A/V FISTULAGRAM Right 11/18/2017   Procedure: A/V FISTULAGRAM - Right Arm;  Surgeon: Elam Dutch, MD;  Location: [place] [facility];  Service: Cardiovascular;  Laterality: Right;  . AMPUTATION Right 09/04/2020   Procedure: RIGHT INDEX AND  RIGHT RING FINGER AMPUTATION DIGIT;  Surgeon: Leanora Cover, MD;  Location: [place];  Service: Orthopedics;  Laterality: Right;  . APPLICATION OF WOUND VAC Left 06/14/2017   Procedure: APPLICATION OF WOUND VAC;  Surgeon: Katha Cabal, MD;  Location: [facility];  Service: Vascular;  Laterality: Left;  . AV FISTULA PLACEMENT  2012   BELIEVED WAS PLACED IN JUNE  . AV FISTULA PLACEMENT Right 08/09/2017   Procedure: Creation Right arm ARTERIOVENOUS BRACHIOCEPOHALIC FISTULA;  Surgeon: Elam Dutch, MD;  Location: [facility] OR;  Service: Vascular;  Laterality: Right;  . AV FISTULA PLACEMENT Right 11/22/2017   Procedure: INSERTION OF ARTERIOVENOUS (AV) GORE-TEX GRAFT RIGHT UPPER ARM;  Surgeon: Elam Dutch, MD;  Location: [place];  Service: Vascular;  Laterality: Right;  . BIOPSY  01/25/2018   Procedure: BIOPSY;  Surgeon: Jerene Bears, MD;  Location: [place];  Service: Gastroenterology;;  . BIOPSY  04/10/2019   Procedure: BIOPSY;  Surgeon: Jerene Bears, MD;  Location: [facility];  Service: Gastroenterology;;  . COLONOSCOPY    . COLONOSCOPY WITH PROPOFOL N/A 01/25/2018   Procedure: COLONOSCOPY WITH PROPOFOL;  Surgeon: Jerene Bears, MD;  Location: [place];  Service: Gastroenterology;  Laterality: N/A;  . CORONARY STENT INTERVENTION N/A 02/22/2017   Procedure: CORONARY STENT INTERVENTION;  Surgeon: Nigel Mormon, MD;  Location: [place] [facility];  Service: Cardiovascular;  Laterality: N/A;  . ESOPHAGOGASTRODUODENOSCOPY (EGD) WITH PROPOFOL N/A 01/25/2018   Procedure: ESOPHAGOGASTRODUODENOSCOPY (EGD) WITH PROPOFOL;  Surgeon: Jerene Bears, MD;  Location: [place];  Service: Gastroenterology;  Laterality: N/A;  . ESOPHAGOGASTRODUODENOSCOPY (EGD) WITH PROPOFOL N/A 04/10/2019   Procedure: ESOPHAGOGASTRODUODENOSCOPY (EGD) WITH PROPOFOL;  Surgeon: Jerene Bears, MD;  Location: [facility];  Service: Gastroenterology;  Laterality: N/A;  . FLEXIBLE SIGMOIDOSCOPY N/A 07/15/2017   Procedure:  FLEXIBLE SIGMOIDOSCOPY;  Surgeon: Carol Ada, MD;  Location: [place];  Service: Endoscopy;  Laterality: N/A;  . HEMORRHOID BANDING    . I & D EXTREMITY Left 06/01/2017   Procedure: IRRIGATION AND DEBRIDEMENT LEFT ARM HEMATOMA WITH LIGATION OF LEFT ARM AV FISTULA;  Surgeon: Elam Dutch, MD;  Location: [place];  Service: Vascular;  Laterality: Left;  . I & D EXTREMITY Left 06/14/2017   Procedure: IRRIGATION AND DEBRIDEMENT EXTREMITY;  Surgeon: Katha Cabal, MD;  Location: [facility];  Service: Vascular;  Laterality: Left;  . INSERTION OF DIALYSIS CATHETER  05/30/2017  . INSERTION OF DIALYSIS CATHETER N/A 05/30/2017   Procedure: INSERTION OF DIALYSIS CATHETER;  Surgeon: Elam Dutch, MD;  Location: [place];  Service: Vascular;  Laterality: N/A;  . IR PARACENTESIS  08/30/2017  . IR PARACENTESIS  09/29/2017  . IR PARACENTESIS  10/28/2017  . IR PARACENTESIS  11/09/2017  . IR PARACENTESIS  11/16/2017  . IR PARACENTESIS  11/28/2017  . IR PARACENTESIS  12/01/2017  . IR PARACENTESIS  12/06/2017  . IR PARACENTESIS  01/03/2018  . IR PARACENTESIS  01/23/2018  . IR PARACENTESIS  02/07/2018  . IR PARACENTESIS  02/21/2018  . IR PARACENTESIS  03/06/2018  . IR PARACENTESIS  03/17/2018  . IR PARACENTESIS  04/04/2018  . IR PARACENTESIS  12/28/2018  . IR PARACENTESIS  01/08/2019  . IR PARACENTESIS  01/23/2019  . IR PARACENTESIS  02/01/2019  . IR PARACENTESIS  02/19/2019  . IR PARACENTESIS  03/01/2019  . IR PARACENTESIS  03/15/2019  . IR PARACENTESIS  04/03/2019  . IR PARACENTESIS  04/12/2019  . IR PARACENTESIS  05/01/2019  . IR PARACENTESIS  05/08/2019  . IR PARACENTESIS  05/24/2019  . IR PARACENTESIS  06/12/2019  . IR PARACENTESIS  07/09/2019  . IR PARACENTESIS  07/27/2019  . IR PARACENTESIS  08/09/2019  . IR PARACENTESIS  08/21/2019  . IR PARACENTESIS  09/17/2019  . IR PARACENTESIS  10/05/2019  . IR PARACENTESIS  10/29/2019  . IR PARACENTESIS  11/08/2019  . IR PARACENTESIS  12/12/2019  . IR PARACENTESIS   01/03/2020  . IR PARACENTESIS  01/10/2020  . IR PARACENTESIS  01/17/2020  . IR PARACENTESIS  01/24/2020  . IR PARACENTESIS  01/31/2020  . IR PARACENTESIS  02/07/2020  . IR PARACENTESIS  02/21/2020  . IR PARACENTESIS  05/21/2020  . IR RADIOLOGIST EVAL & MGMT  02/14/2018  . IR RADIOLOGIST EVAL & MGMT  02/22/2019  . LEFT HEART CATH AND CORONARY ANGIOGRAPHY N/A 02/22/2017   Procedure: LEFT HEART CATH AND CORONARY ANGIOGRAPHY;  Surgeon: Nigel Mormon, MD;  Location: [place] [facility];  Service: Cardiovascular;  Laterality: N/A;  . LIGATION OF ARTERIOVENOUS  FISTULA Left 5/0/3546   Procedure: Plication of Left Arm Arteriovenous Fistula;  Surgeon: Elam Dutch, MD;  Location: [place];  Service: Vascular;  Laterality: Left;  . POLYPECTOMY    . POLYPECTOMY  01/25/2018   Procedure: POLYPECTOMY;  Surgeon: Jerene Bears, MD;  Location: [place];  Service: Gastroenterology;;  . REVISON OF ARTERIOVENOUS FISTULA Left 5/68/1275   Procedure: PLICATION OF DISTAL ANEURYSMAL SEGEMENT OF LEFT UPPER ARM ARTERIOVENOUS FISTULA;  Surgeon: Elam Dutch, MD;  Location: [place];  Service: Vascular;  Laterality: Left;  . REVISON OF ARTERIOVENOUS FISTULA Left 1/70/0174   Procedure: Plication of Left Upper Arm Fistula ;  Surgeon: Waynetta Sandy, MD;  Location: [place];  Service: Vascular;  Laterality: Left;  . SKIN GRAFT SPLIT THICKNESS LEG / FOOT Left    SKIN GRAFT SPLIT THICKNESS LEFT ARM DONOR SITE: LEFT ANTERIOR THIGH  . SKIN SPLIT GRAFT Left 07/04/2017   Procedure: SKIN GRAFT SPLIT THICKNESS LEFT ARM DONOR SITE: LEFT ANTERIOR THIGH;  Surgeon: Elam Dutch, MD;  Location: [place];  Service: Vascular;  Laterality: Left;  . THROMBECTOMY W/ EMBOLECTOMY Left 06/05/2017   Procedure: EXPLORATION OF LEFT ARM FOR BLEEDING; OVERSEWED PROXIMAL FISTULA;  Surgeon: Angelia Mould, MD;  Location: [place];  Service: Vascular;  Laterality: Left;  . WOUND EXPLORATION Left 06/03/2017   Procedure: WOUND EXPLORATION  WITH WOUND VAC APPLICATION TO LEFT ARM;  Surgeon: Angelia Mould, MD;  Location: [facility] OR;  Service: Vascular;  Laterality: Left;   Family History  Problem Relation Age of Onset  . Heart disease Mother   . Lung cancer Mother   . Heart disease Father   . Malignant hyperthermia Father   . COPD Father   . Throat cancer Sister   . Esophageal cancer Sister   . Hypertension Other   . COPD Other   . Throat cancer Sister   . Colon cancer Neg Hx   . Colon polyps Neg Hx   . Rectal cancer Neg Hx   . Stomach cancer Neg  Hx     Social History   Tobacco Use  . Smoking status: Current Every Day Smoker    Packs/day: 1.00    Years: 43.00    Pack years: 43.00    Types: Cigarettes    Start date: 08/13/1973  . Smokeless tobacco: Never Used  Substance Use Topics  . Alcohol use: Not Currently    Comment: quit drinking in 2017   Marital Status: Single   ROS  Review of Systems  Constitutional: Negative for malaise/fatigue and weight gain.  HENT: Negative for sore throat.   Cardiovascular: Positive for dyspnea on exertion (chronic, stable ). Negative for chest pain, claudication, leg swelling, near-syncope, orthopnea, palpitations, paroxysmal nocturnal dyspnea and syncope.  Respiratory: Negative for shortness of breath and wheezing.   Hematologic/Lymphatic: Does not bruise/bleed easily.  Gastrointestinal: Negative for abdominal pain, hematochezia, melena and nausea.  Neurological: Negative for dizziness and weakness.   Objective  There were no vitals taken for this visit.  Vitals with BMI 09/12/2020 09/12/2020 09/12/2020  Height - - -  Weight 153 lbs 4 oz - -  BMI 37.16 - -  Systolic 967 893 810  Diastolic 175 88 3  Pulse 68 62 55  Some encounter information is confidential and restricted. Go to Review Flowsheets activity to see all data.     Physical Exam Vitals reviewed.  Constitutional:      General: He is not in acute distress.    Appearance: He is normal weight. He is  ill-appearing.     Comments: Appears older than stated age.  Cardiovascular:     Rate and Rhythm: Normal rate. Rhythm irregularly irregular.     Heart sounds: Murmur heard.   Early systolic murmur is present with a grade of 2/6 at the upper right sternal border.  Low-pitched rumbling crescendo presystolic murmur is present with a grade of 3/4 at the apex.     Comments: S1 normal and S2 loud. No murmur. No JVD. No edema.  Right arm AV graft noted Pulmonary:     Effort: Pulmonary effort is normal. No accessory muscle usage or respiratory distress.     Breath sounds: Normal breath sounds.  Abdominal:     General: Bowel sounds are normal.     Palpations: Abdomen is soft.  Musculoskeletal:        General: Normal range of motion.    Laboratory examination:   Recent Labs    02/17/20 1208 02/18/20 0002 02/18/20 0215 05/19/20 0934 09/10/20 1559 09/11/20 0047 09/12/20 0339  NA 135 131* 136   < > 137 135 132*  K 6.7* 6.5* 3.7   < > 3.6 3.2* 3.9  CL 92* 90* 96*   < > 91* 92* 91*  CO2 28 26 28    < > 22 22 17*  GLUCOSE 105* 108* 109*   < > 106* 127* 117*  BUN 40* 49* 16   < > 19 20 31*  CREATININE 5.89* 6.37* 2.90*   < > 6.17* 6.68* 8.28*  CALCIUM 9.6 9.8 9.2   < > 10.4* 9.9 10.8*  GFRNONAA 10* 9* 23*   < > 10* 9* 7*  GFRAA 11* 10* 27*  --   --   --   --    < > = values in this interval not displayed.   estimated creatinine clearance is 9.6 mL/min (A) (by C-G formula based on SCr of 8.28 mg/dL (H)).  CMP Latest Ref Rng & Units 09/12/2020 09/11/2020 09/10/2020  Glucose 70 -  99 mg/dL 117(H) 127(H) 106(H)  BUN 6 - 20 mg/dL 31(H) 20 19  Creatinine 0.61 - 1.24 mg/dL 8.28(H) 6.68(H) 6.17(H)  Sodium 135 - 145 mmol/L 132(L) 135 137  Potassium 3.5 - 5.1 mmol/L 3.9 3.2(L) 3.6  Chloride 98 - 111 mmol/L 91(L) 92(L) 91(L)  CO2 22 - 32 mmol/L 17(L) 22 22  Calcium 8.9 - 10.3 mg/dL 10.8(H) 9.9 10.4(H)  Total Protein 6.5 - 8.1 g/dL 7.4 6.8 -  Total Bilirubin 0.3 - 1.2 mg/dL 1.4(H) 1.3(H) -   Alkaline Phos 38 - 126 U/L 231(H) 199(H) -  AST 15 - 41 U/L 56(H) 81(H) -  ALT 0 - 44 U/L 19 28 -   CBC Latest Ref Rng & Units 09/12/2020 09/12/2020 09/11/2020  WBC 4.0 - 10.5 K/uL 11.5(H) 12.4(H) 10.0  Hemoglobin 13.0 - 17.0 g/dL 11.1(L) 12.0(L) 10.2(L)  Hematocrit 39.0 - 52.0 % RESULTS UNAVAILABLE DUE TO INTERFERING SUBSTANCE RESULTS UNAVAILABLE DUE TO INTERFERING SUBSTANCE 25.7(L)  Platelets 150 - 400 K/uL 133(L) 131(L) 140(L)   Lipid Panel     Component Value Date/Time   CHOL 102 09/07/2020 2340   TRIG 175 (H) 09/07/2020 2340   HDL 26 (L) 09/07/2020 2340   CHOLHDL 3.9 09/07/2020 2340   VLDL 35 09/07/2020 2340   LDLCALC 41 09/07/2020 2340   HEMOGLOBIN A1C Lab Results  Component Value Date   HGBA1C 4.8 01/26/2020   MPG 91.06 01/26/2020   TSH No results for input(s): TSH in the last 8760 hours.  Lipid Panel Recent Labs    10/04/19 1400 02/18/20 0001 09/07/20 2340  CHOL  --  140 102  TRIG 183* 107 175*  LDLCALC  --  81 41  VLDL  --  21 35  HDL  --  38* 26*  CHOLHDL  --  3.7 3.9    External labs:   03/11/2018: TSH 1.32 normal  Medications and allergies   Allergies  Allergen Reactions  . Tramadol Itching and Other (See Comments)    Other reaction(s): Unknown  . Grass Extracts [Gramineae Pollens]     Other reaction(s): Sneezing  . Morphine And Related Other (See Comments)    Stomach pain  . Pollen Extract Other (See Comments)    Other reaction(s): Sneezing (finding)  . Acetaminophen Nausea Only    Stomach ache   . Aspirin Itching and Other (See Comments)    STOMACH PAIN  Other reaction(s): Unknown  . Clonidine Derivatives Itching     Current Outpatient Medications  Medication Instructions  . albuterol (VENTOLIN HFA) 108 (90 Base) MCG/ACT inhaler INHALE 2 PUFFS INTO THE LUNGS EVERY 4 HOURS AS NEEDED FOR WHEEZING OR SHORTNESS OF BREATH.  Marland Kitchen amiodarone (PACERONE) 200 mg, Oral, Daily  . cetirizine (ZYRTEC) 10 mg, Oral, Daily PRN  . cinacalcet (SENSIPAR)  60 mg, Oral, Every M-W-F (Hemodialysis)  . diltiazem (CARDIZEM CD) 120 MG 24 hr capsule TAKE 1 CAPSULE BY MOUTH EVERY DAY  . gabapentin (NEURONTIN) 300 mg, Oral, Daily at bedtime  . lactulose (CHRONULAC) 20 g, Oral, Daily  . metoprolol succinate (TOPROL XL) 25 mg, Oral, Daily  . multivitamin (RENA-VIT) TABS tablet 1 tablet, Oral, Daily at bedtime  . Nutritional Supplements (FEEDING SUPPLEMENT, NEPRO CARB STEADY,) LIQD 237 mLs, Oral, 3 times daily between meals  . ondansetron (ZOFRAN) 4 MG tablet TAKE 1 TABLET BY MOUTH EVERY 8 HOURS AS NEEDED FOR NAUSEA AND VOMITING  . Oxycodone HCl 10 mg, Oral, 3 times daily PRN  . pantoprazole (PROTONIX) 40 mg, Oral, 2 times daily  .  sevelamer carbonate (RENVELA) 2,400 mg, Oral, See admin instructions, Take 3 tablets (2400 mg) by mouth up to three times daily with meals  . sulfamethoxazole-trimethoprim (BACTRIM DS) 800-160 MG tablet 1 tablet, Oral, Daily  . tenofovir (VIREAD) 300 MG tablet Take 1 by mouth every Monday.   Radiology:   No results found.  Cardiac Studies:   Coronary Angiogram 02/22/2017: No significant disease on left, mid RCA high-grade stenosis status post 3.5 x 23 mm Xience [place] DES.  Carotid Doppler 10/28/2016: Stenosis in the right common carotid artery (<50%) with severe heteregenous plaque. Stenosis in the left internal carotid artery (16-49%). Severe heteregenous plaque in the left common carotid artery with < 50% stenosis. Mild stenosis in the left external carotid artery (<50%). Antegrade vertebral artery flow. Follow up in one year is appropriate if clinically indicated.  Lower Venous DVT Study 10/05/2019: BILATERAL: - No evidence of deep vein thrombosis seen in the lower extremities, bilaterally.  RIGHT & LEFT: - No cystic structure found in the popliteal fossa.   Echocardiogram 02/05/2020: 1. Left ventricular ejection fraction, by estimation, is 50 to 55%. The left ventricle has normal function. There is severe left  ventricular hypertrophy. Left ventricular diastolic function could not be evaluated. I cannot exclude septal hypokinesis. Endocardium not well visualized. septal motion also is consistent with LBBB.  2. Right ventricular systolic function is mildly reduced. The right ventricular size is moderately enlarged. Severely increased right ventricular wall thickness. There is moderately elevated pulmonary artery systolic pressure. The estimated right ventricular systolic pressure is 95.6 mmHg.  3. Left atrial size was severely dilated.  4. Right atrial size was severely dilated.  5. The mitral valve is normal in structure. Mild mitral valve regurgitation.  6. Dilated TV annulus. Tricuspid valve regurgitation is moderate to severe.  7. AV sclerosis without stenosis. The aortic valve is tricuspid. Aortic valve regurgitation is trivial. Mild aortic valve sclerosis is present, with no evidence of aortic valve stenosis. Aortic valve mean gradient measures 8.3 mmHg.  8. The inferior vena cava is dilated in size with <50% respiratory variability, suggesting right atrial pressure of 15 mmHg.    EKG   EKG 07/22/2020: Atrial fibrillation with controlled ventricular response at a rate of 86 bpm.  Left bundle branch block, no further analysis. Compared to EKG 03/25/2020, no significant change.    EKG 03/25/2020: Atrial fibrillation with controlled ventricular response at a rate of 81 bpm, complete left bundle branch block. No further analysis.  Compared to EKG 02/07/2020 ventricular rate controlled.   EKG 02/07/2020: Atrial fibrillation with rapid ventricular response, ventricular rate 111 bpm, left bundle branch block.  No further analysis.  EKG 12/27/2019: Sinus rhythm with first-degree AV block at rate of 77 bpm, rare PACs, left bundle branch block.  No further analysis.  Assessment     ICD-10-CM   1. Paroxysmal atrial fibrillation (HCC)  I48.0   2. Essential hypertension  I10   3. Cor pulmonale, chronic  (HCC)  I27.81    .   No orders of the defined types were placed in this encounter.   There are no discontinued medications.  Recommendations:   Jantz Main  is a 60 y.o.  male  with very complex medical problems including severe COPD and chronic cor pulmonale with pulmonary hypertension, coronary artery disease s/p stenting to mid RCA in 2018, paroxysmal atrial fibrillation with RVR, hypertensive cardiomyopathy, dietary and medication noncompliance, end-stage renal disease on hemodialysis, chronic hepatitis B and C with cirrhosis of the  liver and ascites, tobacco use, smokes 1/2 ppd, h/o marijuana use and prior cocaine use quit in 2017. Not on anticoagulation due to cirrhosis of liver and compliance with Warfarin.  He has had multiple ER visits for A fib with RVR, syncope, shortness of breath and hyperkalemia.  He has also been in the hospital with acute respiratory distress and pulmonary edema due to missed dialysis. Since last visit patient developed nonhealing wound to right index and ring fingers which developed ischemia and necrosis requiring surgical amputation.  Most recently he was hospitalized at [facility] from 09/07/2020-09/12/2020 for accidental acetaminophen overdose and subsequent shock, therefore his metoprolol Cardizem were held in the hospital, resumed on discharge  ***Patient now presents for follow-up of atrial fibrillation and heart failure.  ***  ***  Patient presents for 6-week follow-up of atrial fibrillation and heart failure.  He is presently without clinical signs of heart failure.  Patient states he has been feeling well since recent hospitalization.  Patient is currently taking diltiazem, however he is unsure if he is taking metoprolol succinate and amiodarone.  Patient is in atrial fibrillation with controlled ventricular response today.  Advised patient to verify that he is taking both amiodarone and metoprolol succinate as well as his other cardiovascular  medications, patient will notify our office if he has not been taking these medications.  I have recent prescriptions for both metoprolol and amiodarone.  Encourage patient to continue to take medications as directed and to abide by his dialysis schedule.  Advised him regarding the importance of continued compliance with both dialysis and medications.  Follow-up in 6 weeks for atrial fibrillation and heart failure.   Alethia Berthold, PA-C 09/15/2020, 2:06 PM Office: [phone]

## 2020-09-16 ENCOUNTER — Ambulatory Visit: Payer: Medicare Other | Admitting: Student

## 2020-09-16 DIAGNOSIS — I2781 Cor pulmonale (chronic): Secondary | ICD-10-CM

## 2020-09-16 DIAGNOSIS — I480 Paroxysmal atrial fibrillation: Secondary | ICD-10-CM

## 2020-09-16 DIAGNOSIS — I10 Essential (primary) hypertension: Secondary | ICD-10-CM

## 2020-09-17 ENCOUNTER — Other Ambulatory Visit: Payer: Self-pay

## 2020-09-17 ENCOUNTER — Emergency Department (HOSPITAL_COMMUNITY)
Admission: EM | Admit: 2020-09-17 | Discharge: 2020-09-18 | Disposition: A | Discharge status: Home / Self Care | Payer: Medicare Other | Attending: Emergency Medicine | Admitting: Emergency Medicine

## 2020-09-17 DIAGNOSIS — Z951 Presence of aortocoronary bypass graft: Secondary | ICD-10-CM | POA: Diagnosis not present

## 2020-09-17 DIAGNOSIS — S68620D Partial traumatic transphalangeal amputation of right index finger, subsequent encounter: Secondary | ICD-10-CM | POA: Diagnosis not present

## 2020-09-17 DIAGNOSIS — J441 Chronic obstructive pulmonary disease with (acute) exacerbation: Secondary | ICD-10-CM | POA: Insufficient documentation

## 2020-09-17 DIAGNOSIS — X58XXXD Exposure to other specified factors, subsequent encounter: Secondary | ICD-10-CM | POA: Insufficient documentation

## 2020-09-17 DIAGNOSIS — M79644 Pain in right finger(s): Secondary | ICD-10-CM | POA: Insufficient documentation

## 2020-09-17 DIAGNOSIS — N186 End stage renal disease: Secondary | ICD-10-CM | POA: Insufficient documentation

## 2020-09-17 DIAGNOSIS — Z79899 Other long term (current) drug therapy: Secondary | ICD-10-CM | POA: Insufficient documentation

## 2020-09-17 DIAGNOSIS — Z992 Dependence on renal dialysis: Secondary | ICD-10-CM | POA: Insufficient documentation

## 2020-09-17 DIAGNOSIS — M79646 Pain in unspecified finger(s): Secondary | ICD-10-CM

## 2020-09-17 DIAGNOSIS — I2510 Atherosclerotic heart disease of native coronary artery without angina pectoris: Secondary | ICD-10-CM | POA: Insufficient documentation

## 2020-09-17 DIAGNOSIS — I120 Hypertensive chronic kidney disease with stage 5 chronic kidney disease or end stage renal disease: Secondary | ICD-10-CM | POA: Diagnosis not present

## 2020-09-17 DIAGNOSIS — S60940D Unspecified superficial injury of right index finger, subsequent encounter: Secondary | ICD-10-CM | POA: Diagnosis present

## 2020-09-17 DIAGNOSIS — S68624D Partial traumatic transphalangeal amputation of right ring finger, subsequent encounter: Secondary | ICD-10-CM | POA: Diagnosis not present

## 2020-09-17 DIAGNOSIS — F17210 Nicotine dependence, cigarettes, uncomplicated: Secondary | ICD-10-CM | POA: Diagnosis not present

## 2020-09-17 NOTE — ED Triage Notes (Addendum)
Pt c/o finger pain. States had a partial amputation two weeks ago and has only one oxycodone left. States he is also already on abx. States he is here for pain control. States he went to dialysis today and completed half the tx. Pt states multiple times that he is here for something for pain.

## 2020-09-18 DIAGNOSIS — S68620D Partial traumatic transphalangeal amputation of right index finger, subsequent encounter: Secondary | ICD-10-CM | POA: Diagnosis not present

## 2020-09-18 MED ORDER — OXYCODONE HCL 5 MG PO TABS
10.0000 mg | ORAL_TABLET | Freq: Once | ORAL | Status: AC
  Administered 2020-09-18: 10 mg via ORAL
  Filled 2020-09-18: qty 2

## 2020-09-18 NOTE — ED Provider Notes (Signed)
[facility] EMERGENCY DEPARTMENT Provider Note   CSN: [id number] Arrival date & time: 09/17/20  2204     History Chief Complaint  Patient presents with  . Finger Injury    [name] is a 60 y.o. male.  Finger stumps hurt. No other complaints. Wants a sandwich and cab voucher. No fevers. No more trauma. No other associated symptoms. Oxycodone at home helps, but only has one left and is on a pain contract.         Past Medical History:  Diagnosis Date  . Anemia   . Anxiety   . Arthritis    left shoulder  . Atherosclerosis of aorta ([place])   . Cardiomegaly   . Chest pain    DATE UNKNOWN, C/O PERIODICALLY  . Cocaine abuse ([place])   . COPD exacerbation ([place]) 08/17/2016  . Coronary artery disease    stent 02/22/17  . Dysrhythmia   . ESRD (end stage renal disease) on dialysis ([place])    "[name]; MWF" (07/04/2017)  . GERD (gastroesophageal reflux disease)    DATE UNKNOWN  . Hemorrhoids   . Hepatitis B, chronic ([place])   . Hepatitis C   . History of kidney stones   . Hyperkalemia   . Hypertension   . Kidney failure   . Metabolic bone disease    Patient denies  . Mitral stenosis   . Myocardial infarction ([place])   . Pneumonia   . Pulmonary edema   . Solitary rectal ulcer syndrome 07/2017   at flex sig for rectal bleeding  . Tubular adenoma of colon     Patient Active Problem List   Diagnosis Date Noted  . Tylenol overdose 09/07/2020  . DNR (do not resuscitate)   . CAP (community acquired pneumonia) 02/09/2020  . Leukocytosis 01/19/2020  . Tobacco use 01/19/2020  . Acute pulmonary edema ([place]) 12/21/2019  . Acetaminophen overdose 10/27/2019  . ESRD (end stage renal disease) ([place])   . Goals of care, counseling/discussion   . Hypoxia 10/04/2019  . Advanced care planning/counseling discussion   . Renal failure 09/26/2019  . Dyspnea 06/09/2019  . Acquired thrombophilia ([place]) 06/05/2019  . A-fib ([place]) 05/30/2019  . Atrial fibrillation  with RVR ([place]) 05/29/2019  . Melena   . Pressure injury of skin 03/09/2019  . Abdominal distention   . Volume overload 12/28/2018  . Sepsis ([place]) 09/12/2018  . Coronary artery disease involving native coronary artery of native heart without angina pectoris 03/11/2018  . Benign neoplasm of cecum   . Benign neoplasm of ascending colon   . Benign neoplasm of descending colon   . Benign neoplasm of rectum   . AF (paroxysmal atrial fibrillation) ([place]) 01/23/2018  . Hx of colonic polyps 01/20/2018  . End-stage renal disease on hemodialysis ([place]) 11/21/2017  . GERD (gastroesophageal reflux disease) 11/16/2017  . Decompensated hepatic cirrhosis ([place]) 11/15/2017  . Palliative care by specialist   . Hyponatremia 11/04/2017  . SBP (spontaneous bacterial peritonitis) ([place]) 10/30/2017  . Liver disease, chronic 10/30/2017  . SOB (shortness of breath)   . Abdominal pain 10/28/2017  . Upper airway cough syndrome with flattening on f/v loop 10/13/17 c/w vcd 10/17/2017  . Elevated diaphragm 10/13/2017  . Ileus ([place]) 09/29/2017  . QT prolongation 09/29/2017  . Malnutrition of moderate degree 09/29/2017  . Sinus congestion 09/03/2017  . Symptomatic anemia 09/02/2017  . Cirrhosis of liver with ascites ([place]) 09/02/2017  . Left bundle branch block 09/02/2017  . Mitral stenosis 09/02/2017  .  Hematochezia 07/15/2017  . Wide-complex tachycardia ([place])   . Endotracheally intubated   . ESRD on dialysis ([place]) 07/04/2017  . CKD (chronic kidney disease) stage V requiring chronic dialysis ([place]) 06/18/2017  . History of Cocaine abuse ([place]) 06/18/2017  . Hypertension 06/18/2017  . Infection of AV graft for dialysis ([place]) 06/18/2017  . Anxiety 06/18/2017  . Anemia due to chronic kidney disease 06/18/2017  . Atypical atrial flutter ([place]) 06/18/2017  . Personality disorder ([place]) 06/13/2017  . Cellulitis 06/12/2017  . Adjustment disorder with mixed anxiety and depressed mood 06/10/2017  . Suicidal ideation  06/10/2017  . Arm wound, left, sequela 06/10/2017  . Dyspnea on exertion 05/29/2017  . Tachycardia 05/29/2017  . Hyperkalemia 05/22/2017  . Acute metabolic encephalopathy   . Anemia 04/23/2017  . Ascites 04/23/2017  . COPD (chronic obstructive pulmonary disease) ([place]) 04/23/2017  . Acute on chronic respiratory failure with hypoxia ([place]) 03/25/2017  . Arrhythmia 03/25/2017  . COPD GOLD 0 with flattening on inps f/v  09/27/2016  . Essential hypertension 09/27/2016  . Fluid overload 08/30/2016  . COPD exacerbation ([place]) 08/17/2016  . Hypertensive urgency 08/17/2016  . Problem with dialysis access ([place]) 07/23/2016  . Chronic hepatitis B ([place]) 03/05/2014  . Chronic hepatitis C without hepatic coma ([place]) 03/05/2014  . Internal hemorrhoids with bleeding, swelling and itching 03/05/2014  . Thrombocytopenia ([place]) 03/05/2014  . Chest pain 02/27/2014  . Alcohol abuse 04/14/2009  . Nicotine dependence, cigarettes, uncomplicated 59/56/3875  . GANGLION CYST 04/14/2009    Past Surgical History:  Procedure Laterality Date  . A/V FISTULAGRAM Left 05/26/2017   Procedure: A/V FISTULAGRAM;  Surgeon: Conrad , MD;  Location: [place] [facility];  Service: Cardiovascular;  Laterality: Left;  . A/V FISTULAGRAM Right 11/18/2017   Procedure: A/V FISTULAGRAM - Right Arm;  Surgeon: Elam Dutch, MD;  Location: [place] [facility];  Service: Cardiovascular;  Laterality: Right;  . AMPUTATION Right 09/04/2020   Procedure: RIGHT INDEX AND RIGHT RING FINGER AMPUTATION DIGIT;  Surgeon: Leanora Cover, MD;  Location: [place];  Service: Orthopedics;  Laterality: Right;  . APPLICATION OF WOUND VAC Left 06/14/2017   Procedure: APPLICATION OF WOUND VAC;  Surgeon: Katha Cabal, MD;  Location: [facility];  Service: Vascular;  Laterality: Left;  . AV FISTULA PLACEMENT  2012   BELIEVED WAS PLACED IN JUNE  . AV FISTULA PLACEMENT Right 08/09/2017   Procedure: Creation Right arm ARTERIOVENOUS BRACHIOCEPOHALIC  FISTULA;  Surgeon: Elam Dutch, MD;  Location: [facility] OR;  Service: Vascular;  Laterality: Right;  . AV FISTULA PLACEMENT Right 11/22/2017   Procedure: INSERTION OF ARTERIOVENOUS (AV) GORE-TEX GRAFT RIGHT UPPER ARM;  Surgeon: Elam Dutch, MD;  Location: [place];  Service: Vascular;  Laterality: Right;  . BIOPSY  01/25/2018   Procedure: BIOPSY;  Surgeon: Jerene Bears, MD;  Location: [place];  Service: Gastroenterology;;  . BIOPSY  04/10/2019   Procedure: BIOPSY;  Surgeon: Jerene Bears, MD;  Location: [facility];  Service: Gastroenterology;;  . COLONOSCOPY    . COLONOSCOPY WITH PROPOFOL N/A 01/25/2018   Procedure: COLONOSCOPY WITH PROPOFOL;  Surgeon: Jerene Bears, MD;  Location: [place];  Service: Gastroenterology;  Laterality: N/A;  . CORONARY STENT INTERVENTION N/A 02/22/2017   Procedure: CORONARY STENT INTERVENTION;  Surgeon: Nigel Mormon, MD;  Location: [place] [facility];  Service: Cardiovascular;  Laterality: N/A;  . ESOPHAGOGASTRODUODENOSCOPY (EGD) WITH PROPOFOL N/A 01/25/2018   Procedure: ESOPHAGOGASTRODUODENOSCOPY (EGD) WITH PROPOFOL;  Surgeon: Jerene Bears,  MD;  Location: [place];  Service: Gastroenterology;  Laterality: N/A;  . ESOPHAGOGASTRODUODENOSCOPY (EGD) WITH PROPOFOL N/A 04/10/2019   Procedure: ESOPHAGOGASTRODUODENOSCOPY (EGD) WITH PROPOFOL;  Surgeon: Jerene Bears, MD;  Location: [facility];  Service: Gastroenterology;  Laterality: N/A;  . FLEXIBLE SIGMOIDOSCOPY N/A 07/15/2017   Procedure: FLEXIBLE SIGMOIDOSCOPY;  Surgeon: Carol Ada, MD;  Location: [place];  Service: Endoscopy;  Laterality: N/A;  . HEMORRHOID BANDING    . I & D EXTREMITY Left 06/01/2017   Procedure: IRRIGATION AND DEBRIDEMENT LEFT ARM HEMATOMA WITH LIGATION OF LEFT ARM AV FISTULA;  Surgeon: Elam Dutch, MD;  Location: [place];  Service: Vascular;  Laterality: Left;  . I & D EXTREMITY Left 06/14/2017   Procedure: IRRIGATION AND DEBRIDEMENT EXTREMITY;  Surgeon: Katha Cabal, MD;  Location: [facility];  Service: Vascular;  Laterality: Left;  . INSERTION OF DIALYSIS CATHETER  05/30/2017  . INSERTION OF DIALYSIS CATHETER N/A 05/30/2017   Procedure: INSERTION OF DIALYSIS CATHETER;  Surgeon: Elam Dutch, MD;  Location: [place];  Service: Vascular;  Laterality: N/A;  . IR PARACENTESIS  08/30/2017  . IR PARACENTESIS  09/29/2017  . IR PARACENTESIS  10/28/2017  . IR PARACENTESIS  11/09/2017  . IR PARACENTESIS  11/16/2017  . IR PARACENTESIS  11/28/2017  . IR PARACENTESIS  12/01/2017  . IR PARACENTESIS  12/06/2017  . IR PARACENTESIS  01/03/2018  . IR PARACENTESIS  01/23/2018  . IR PARACENTESIS  02/07/2018  . IR PARACENTESIS  02/21/2018  . IR PARACENTESIS  03/06/2018  . IR PARACENTESIS  03/17/2018  . IR PARACENTESIS  04/04/2018  . IR PARACENTESIS  12/28/2018  . IR PARACENTESIS  01/08/2019  . IR PARACENTESIS  01/23/2019  . IR PARACENTESIS  02/01/2019  . IR PARACENTESIS  02/19/2019  . IR PARACENTESIS  03/01/2019  . IR PARACENTESIS  03/15/2019  . IR PARACENTESIS  04/03/2019  . IR PARACENTESIS  04/12/2019  . IR PARACENTESIS  05/01/2019  . IR PARACENTESIS  05/08/2019  . IR PARACENTESIS  05/24/2019  . IR PARACENTESIS  06/12/2019  . IR PARACENTESIS  07/09/2019  . IR PARACENTESIS  07/27/2019  . IR PARACENTESIS  08/09/2019  . IR PARACENTESIS  08/21/2019  . IR PARACENTESIS  09/17/2019  . IR PARACENTESIS  10/05/2019  . IR PARACENTESIS  10/29/2019  . IR PARACENTESIS  11/08/2019  . IR PARACENTESIS  12/12/2019  . IR PARACENTESIS  01/03/2020  . IR PARACENTESIS  01/10/2020  . IR PARACENTESIS  01/17/2020  . IR PARACENTESIS  01/24/2020  . IR PARACENTESIS  01/31/2020  . IR PARACENTESIS  02/07/2020  . IR PARACENTESIS  02/21/2020  . IR PARACENTESIS  05/21/2020  . IR RADIOLOGIST EVAL & MGMT  02/14/2018  . IR RADIOLOGIST EVAL & MGMT  02/22/2019  . LEFT HEART CATH AND CORONARY ANGIOGRAPHY N/A 02/22/2017   Procedure: LEFT HEART CATH AND CORONARY ANGIOGRAPHY;  Surgeon: Nigel Mormon, MD;  Location: [place] [facility];  Service: Cardiovascular;  Laterality: N/A;  . LIGATION OF ARTERIOVENOUS  FISTULA Left 01/11/4286   Procedure: Plication of Left Arm Arteriovenous Fistula;  Surgeon: Elam Dutch, MD;  Location: [place];  Service: Vascular;  Laterality: Left;  . POLYPECTOMY    . POLYPECTOMY  01/25/2018   Procedure: POLYPECTOMY;  Surgeon: Jerene Bears, MD;  Location: [place];  Service: Gastroenterology;;  . REVISON OF ARTERIOVENOUS FISTULA Left 6/81/1572   Procedure: PLICATION OF DISTAL ANEURYSMAL SEGEMENT OF LEFT UPPER ARM ARTERIOVENOUS FISTULA;  Surgeon: Elam Dutch, MD;  Location: [facility];  Service: Vascular;  Laterality: Left;  . REVISON OF ARTERIOVENOUS FISTULA Left 9/83/3825   Procedure: Plication of Left Upper Arm Fistula ;  Surgeon: Waynetta Sandy, MD;  Location: [place];  Service: Vascular;  Laterality: Left;  . SKIN GRAFT SPLIT THICKNESS LEG / FOOT Left    SKIN GRAFT SPLIT THICKNESS LEFT ARM DONOR SITE: LEFT ANTERIOR THIGH  . SKIN SPLIT GRAFT Left 07/04/2017   Procedure: SKIN GRAFT SPLIT THICKNESS LEFT ARM DONOR SITE: LEFT ANTERIOR THIGH;  Surgeon: Elam Dutch, MD;  Location: [place];  Service: Vascular;  Laterality: Left;  . THROMBECTOMY W/ EMBOLECTOMY Left 06/05/2017   Procedure: EXPLORATION OF LEFT ARM FOR BLEEDING; OVERSEWED PROXIMAL FISTULA;  Surgeon: Angelia Mould, MD;  Location: [place];  Service: Vascular;  Laterality: Left;  . WOUND EXPLORATION Left 06/03/2017   Procedure: WOUND EXPLORATION WITH WOUND VAC APPLICATION TO LEFT ARM;  Surgeon: Angelia Mould, MD;  Location: [facility] OR;  Service: Vascular;  Laterality: Left;       Family History  Problem Relation Age of Onset  . Heart disease Mother   . Lung cancer Mother   . Heart disease Father   . Malignant hyperthermia Father   . COPD Father   . Throat cancer Sister   . Esophageal cancer Sister   . Hypertension Other   . COPD Other   . Throat cancer Sister   . Colon cancer Neg Hx   .  Colon polyps Neg Hx   . Rectal cancer Neg Hx   . Stomach cancer Neg Hx     Social History   Tobacco Use  . Smoking status: Current Every Day Smoker    Packs/day: 1.00    Years: 43.00    Pack years: 43.00    Types: Cigarettes    Start date: 08/13/1973  . Smokeless tobacco: Never Used  Vaping Use  . Vaping Use: Never used  Substance Use Topics  . Alcohol use: Not Currently    Comment: quit drinking in 2017  . Drug use: Yes    Types: Marijuana, Cocaine    Comment: reports using once every 3 months,  Quit 04-06-2019    Home Medications Prior to Admission medications   Medication Sig Start Date End Date Taking? Authorizing Provider  albuterol (VENTOLIN HFA) 108 (90 Base) MCG/ACT inhaler INHALE 2 PUFFS INTO THE LUNGS EVERY 4 HOURS AS NEEDED FOR WHEEZING OR SHORTNESS OF BREATH. 09/14/20   Charlott Rakes, MD  amiodarone (PACERONE) 200 MG tablet Take 1 tablet (200 mg total) by mouth daily. 07/22/20   Cantwell, Celeste C, PA-C  cetirizine (ZYRTEC) 10 MG tablet Take 10 mg by mouth daily as needed for allergies. 06/23/20   [provider]  cinacalcet (SENSIPAR) 30 MG tablet Take 2 tablets (60 mg total) by mouth every Monday, Wednesday, and Friday with hemodialysis. 09/15/20   Florencia Reasons, MD  diltiazem (CARDIZEM CD) 120 MG 24 hr capsule TAKE 1 CAPSULE BY MOUTH EVERY DAY Patient taking differently: Take 120 mg by mouth daily. 06/11/20   Adrian Prows, MD  gabapentin (NEURONTIN) 300 MG capsule Take 1 capsule (300 mg total) by mouth at bedtime. 09/12/20   Florencia Reasons, MD  lactulose (CHRONULAC) 10 GM/15ML solution Take 30 mLs (20 g total) by mouth daily. 09/12/20   Florencia Reasons, MD  metoprolol succinate (TOPROL XL) 25 MG 24 hr tablet Take 1 tablet (25 mg total) by mouth daily. 07/22/20   Cantwell, Celeste C, PA-C  multivitamin (RENA-VIT)  TABS tablet Take 1 tablet by mouth at bedtime. 09/12/20   Florencia Reasons, MD  Nutritional Supplements (FEEDING SUPPLEMENT, NEPRO CARB STEADY,) LIQD Take 237 mLs by mouth 3 (three)  times daily between meals. 09/12/20   Florencia Reasons, MD  ondansetron (ZOFRAN) 4 MG tablet TAKE 1 TABLET BY MOUTH EVERY 8 HOURS AS NEEDED FOR NAUSEA AND VOMITING Patient taking differently: Take 4 mg by mouth every 8 (eight) hours as needed for nausea or vomiting. 08/29/20   Charlott Rakes, MD  Oxycodone HCl 10 MG TABS Take 1 tablet (10 mg total) by mouth 3 (three) times daily as needed (pain). 09/12/20   Florencia Reasons, MD  pantoprazole (PROTONIX) 40 MG tablet Take 1 tablet (40 mg total) by mouth 2 (two) times daily. 03/25/20   Cantwell, Celeste C, PA-C  sevelamer carbonate (RENVELA) 800 MG tablet Take 2,400 mg by mouth See admin instructions. Take 3 tablets (2400 mg) by mouth up to three times daily with meals    [provider]  sulfamethoxazole-trimethoprim (BACTRIM DS) 800-160 MG tablet Take 1 tablet by mouth daily. 09/12/20   Florencia Reasons, MD  tenofovir Veva Holes) 300 MG tablet Take 1 by mouth every Monday. Patient taking differently: Take 300 mg by mouth once a week. 05/22/20   Maness, Arnette Norris, MD    Allergies    Tramadol, Grass extracts [gramineae pollens], Morphine and related, Pollen extract, Acetaminophen, Aspirin, and Clonidine derivatives  Review of Systems   Review of Systems  All other systems reviewed and are negative.   Physical Exam Updated Vital Signs BP 120/68 (BP Location: Left Arm)   Pulse 80   Temp 98.6 F (37 C) (Oral)   Resp 16   Ht 5\' 9"  (1.753 m)   Wt 69 kg   SpO2 99%   BMI 22.46 kg/m   Physical Exam Vitals and nursing note reviewed.  Constitutional:      Appearance: He is well-developed.  HENT:     Head: Normocephalic and atraumatic.     Mouth/Throat:     Mouth: Mucous membranes are moist.     Pharynx: Oropharynx is clear.  Eyes:     Pupils: Pupils are equal, round, and reactive to light.  Cardiovascular:     Rate and Rhythm: Normal rate.  Pulmonary:     Effort: Pulmonary effort is normal. No respiratory distress.  Abdominal:     General: Abdomen is flat.  There is no distension.  Musculoskeletal:        General: Deformity (right index and ring finger partial amputations, wounds c/d/i) present. No swelling or tenderness. Normal range of motion.     Cervical back: Normal range of motion.  Skin:    General: Skin is warm and dry.  Neurological:     General: No focal deficit present.     Mental Status: He is alert.     ED Results / Procedures / Treatments   Labs (all labs ordered are listed, but only abnormal results are displayed) Labs Reviewed - No data to display  EKG None  Radiology No results found.  Procedures Procedures   Medications Ordered in ED Medications  oxyCODONE (Oxy IR/ROXICODONE) immediate release tablet 10 mg (10 mg Oral Given 09/18/20 0414)    ED Course  I have reviewed the triage vital signs and the nursing notes.  Pertinent labs & imaging results that were available during my care of the patient were reviewed by me and considered in my medical decision making (see chart for details).  MDM Rules/Calculators/A&P                          Oxycodone and sandwich given. No e/o infection or other causes for pain. Able to sleep comfortably as well. Dc to fu w/ outpatient resources.   Final Clinical Impression(s) / ED Diagnoses Final diagnoses:  Pain of finger, unspecified laterality    Rx / DC Orders ED Discharge Orders    None       Teyton Pattillo, Corene Cornea, MD 09/18/20 [phone]

## 2020-09-18 NOTE — ED Notes (Signed)
Pt requesting for ride home. This RN explained to pt that we do not have cab vouchers to give at this time. Encouraged pt to call relative/ friend to pick him up.

## 2020-09-20 ENCOUNTER — Emergency Department (HOSPITAL_COMMUNITY)
Admission: EM | Admit: 2020-09-20 | Discharge: 2020-09-20 | Disposition: A | Discharge status: Home / Self Care | Payer: Medicare Other | Attending: Emergency Medicine | Admitting: Emergency Medicine

## 2020-09-20 ENCOUNTER — Encounter (HOSPITAL_COMMUNITY): Payer: Self-pay | Admitting: Emergency Medicine

## 2020-09-20 ENCOUNTER — Emergency Department (HOSPITAL_COMMUNITY): Payer: Medicare Other

## 2020-09-20 DIAGNOSIS — N186 End stage renal disease: Secondary | ICD-10-CM | POA: Diagnosis not present

## 2020-09-20 DIAGNOSIS — D631 Anemia in chronic kidney disease: Secondary | ICD-10-CM | POA: Diagnosis not present

## 2020-09-20 DIAGNOSIS — I2510 Atherosclerotic heart disease of native coronary artery without angina pectoris: Secondary | ICD-10-CM | POA: Insufficient documentation

## 2020-09-20 DIAGNOSIS — R0602 Shortness of breath: Secondary | ICD-10-CM | POA: Diagnosis present

## 2020-09-20 DIAGNOSIS — Z79899 Other long term (current) drug therapy: Secondary | ICD-10-CM | POA: Insufficient documentation

## 2020-09-20 DIAGNOSIS — Z992 Dependence on renal dialysis: Secondary | ICD-10-CM | POA: Insufficient documentation

## 2020-09-20 DIAGNOSIS — R410 Disorientation, unspecified: Secondary | ICD-10-CM | POA: Insufficient documentation

## 2020-09-20 DIAGNOSIS — J441 Chronic obstructive pulmonary disease with (acute) exacerbation: Secondary | ICD-10-CM | POA: Diagnosis not present

## 2020-09-20 DIAGNOSIS — F17210 Nicotine dependence, cigarettes, uncomplicated: Secondary | ICD-10-CM | POA: Insufficient documentation

## 2020-09-20 DIAGNOSIS — Y793 Surgical instruments, materials and orthopedic devices (including sutures) associated with adverse incidents: Secondary | ICD-10-CM | POA: Diagnosis not present

## 2020-09-20 DIAGNOSIS — I1311 Hypertensive heart and chronic kidney disease without heart failure, with stage 5 chronic kidney disease, or end stage renal disease: Secondary | ICD-10-CM | POA: Insufficient documentation

## 2020-09-20 DIAGNOSIS — T8789 Other complications of amputation stump: Secondary | ICD-10-CM | POA: Diagnosis not present

## 2020-09-20 LAB — CBC WITH DIFFERENTIAL/PLATELET
Abs Immature Granulocytes: 0.03 10*3/uL (ref 0.00–0.07)
Basophils Absolute: 0.1 10*3/uL (ref 0.0–0.1)
Basophils Relative: 1 %
Eosinophils Absolute: 0.2 10*3/uL (ref 0.0–0.5)
Eosinophils Relative: 2 %
HCT: 32.3 % — ABNORMAL LOW (ref 39.0–52.0)
Hemoglobin: 11.5 g/dL — ABNORMAL LOW (ref 13.0–17.0)
Immature Granulocytes: 0 %
Lymphocytes Relative: 17 %
Lymphs Abs: 1.7 10*3/uL (ref 0.7–4.0)
MCH: 29.4 pg (ref 26.0–34.0)
MCHC: 35.6 g/dL (ref 30.0–36.0)
MCV: 82.6 fL (ref 80.0–100.0)
Monocytes Absolute: 0.8 10*3/uL (ref 0.1–1.0)
Monocytes Relative: 8 %
Neutro Abs: 7 10*3/uL (ref 1.7–7.7)
Neutrophils Relative %: 72 %
Platelets: 235 10*3/uL (ref 150–400)
RBC: 3.91 MIL/uL — ABNORMAL LOW (ref 4.22–5.81)
RDW: 16.6 % — ABNORMAL HIGH (ref 11.5–15.5)
WBC: 9.8 10*3/uL (ref 4.0–10.5)
nRBC: 0 % (ref 0.0–0.2)

## 2020-09-20 LAB — BASIC METABOLIC PANEL
Anion gap: 16 — ABNORMAL HIGH (ref 5–15)
BUN: 17 mg/dL (ref 6–20)
CO2: 27 mmol/L (ref 22–32)
Calcium: 8.4 mg/dL — ABNORMAL LOW (ref 8.9–10.3)
Chloride: 93 mmol/L — ABNORMAL LOW (ref 98–111)
Creatinine, Ser: 6.52 mg/dL — ABNORMAL HIGH (ref 0.61–1.24)
GFR, Estimated: 9 mL/min — ABNORMAL LOW (ref >60)
Glucose, Bld: 126 mg/dL — ABNORMAL HIGH (ref 70–99)
Potassium: 4 mmol/L (ref 3.5–5.1)
Sodium: 136 mmol/L (ref 135–145)

## 2020-09-20 MED ORDER — OXYCODONE HCL 5 MG PO TABS
5.0000 mg | ORAL_TABLET | Freq: Once | ORAL | Status: AC
  Administered 2020-09-20: 5 mg via ORAL
  Filled 2020-09-20: qty 1

## 2020-09-20 NOTE — ED Triage Notes (Signed)
C/o SOB that started today and reports pain to R fingers that were amputated a few weeks ago.  Pt hypotensive.  Last dialysis yesterday.

## 2020-09-20 NOTE — Discharge Instructions (Addendum)
Return for any problem.  ?

## 2020-09-20 NOTE — ED Notes (Signed)
Refused orthostatic VS.

## 2020-09-20 NOTE — ED Provider Notes (Signed)
[facility] Provider Note   CSN: [id number] Arrival date & time: 09/20/20  1401     History Chief Complaint  Patient presents with  . Shortness of Breath    [name] is a 60 y.o. male.  60 year old male with prior medical history as detailed below presents for evaluation.  Patient is complaining of pain to his finger stump status post recent amputations.  Patient is otherwise without specific complaint.  Patient denies shortness of breath or fever.  Patient denies chest pain.  Patient reports compliance with recent dialysis session.  Patient reports that he had been getting oxycodone for his amputation related pain.  He reports that he ran out.  The history is provided by the patient and medical records.  Illness Location:  Pain at site of prior amputations Severity:  Mild Onset quality:  Unable to specify Timing:  Unable to specify Progression:  Unable to specify Associated symptoms: no fatigue and no fever        Past Medical History:  Diagnosis Date  . Anemia   . Anxiety   . Arthritis    left shoulder  . Atherosclerosis of aorta ([place])   . Cardiomegaly   . Chest pain    DATE UNKNOWN, C/O PERIODICALLY  . Cocaine abuse ([place])   . COPD exacerbation ([place]) 08/17/2016  . Coronary artery disease    stent 02/22/17  . Dysrhythmia   . ESRD (end stage renal disease) on dialysis ([place])    "[name]; MWF" (07/04/2017)  . GERD (gastroesophageal reflux disease)    DATE UNKNOWN  . Hemorrhoids   . Hepatitis B, chronic ([place])   . Hepatitis C   . History of kidney stones   . Hyperkalemia   . Hypertension   . Kidney failure   . Metabolic bone disease    Patient denies  . Mitral stenosis   . Myocardial infarction ([place])   . Pneumonia   . Pulmonary edema   . Solitary rectal ulcer syndrome 07/2017   at [facility] for rectal bleeding  . Tubular adenoma of colon     Patient Active Problem List   Diagnosis Date Noted  .  Tylenol overdose 09/07/2020  . DNR (do not resuscitate)   . CAP (community acquired pneumonia) 02/09/2020  . Leukocytosis 01/19/2020  . Tobacco use 01/19/2020  . Acute pulmonary edema ([place]) 12/21/2019  . Acetaminophen overdose 10/27/2019  . ESRD (end stage renal disease) ([place])   . Goals of care, counseling/discussion   . Hypoxia 10/04/2019  . Advanced care planning/counseling discussion   . Renal failure 09/26/2019  . Dyspnea 06/09/2019  . Acquired thrombophilia ([place]) 06/05/2019  . A-fib ([place]) 05/30/2019  . Atrial fibrillation with RVR ([place]) 05/29/2019  . Melena   . Pressure injury of skin 03/09/2019  . Abdominal distention   . Volume overload 12/28/2018  . Sepsis ([place]) 09/12/2018  . Coronary artery disease involving native coronary artery of native heart without angina pectoris 03/11/2018  . Benign neoplasm of cecum   . Benign neoplasm of ascending colon   . Benign neoplasm of descending colon   . Benign neoplasm of rectum   . AF (paroxysmal atrial fibrillation) ([place]) 01/23/2018  . Hx of colonic polyps 01/20/2018  . End-stage renal disease on hemodialysis ([place]) 11/21/2017  . GERD (gastroesophageal reflux disease) 11/16/2017  . Decompensated hepatic cirrhosis ([place]) 11/15/2017  . Palliative care by specialist   . Hyponatremia 11/04/2017  . SBP (spontaneous bacterial peritonitis) ([place]) 10/30/2017  .  Liver disease, chronic 10/30/2017  . SOB (shortness of breath)   . Abdominal pain 10/28/2017  . Upper airway cough syndrome with flattening on f/v loop 10/13/17 c/w vcd 10/17/2017  . Elevated diaphragm 10/13/2017  . Ileus ([place]) 09/29/2017  . QT prolongation 09/29/2017  . Malnutrition of moderate degree 09/29/2017  . Sinus congestion 09/03/2017  . Symptomatic anemia 09/02/2017  . Cirrhosis of liver with ascites ([place]) 09/02/2017  . Left bundle branch block 09/02/2017  . Mitral stenosis 09/02/2017  . Hematochezia 07/15/2017  . Wide-complex tachycardia ([place])   . Endotracheally  intubated   . ESRD on dialysis ([place]) 07/04/2017  . CKD (chronic kidney disease) stage V requiring chronic dialysis ([place]) 06/18/2017  . History of Cocaine abuse ([place]) 06/18/2017  . Hypertension 06/18/2017  . Infection of AV graft for dialysis ([place]) 06/18/2017  . Anxiety 06/18/2017  . Anemia due to chronic kidney disease 06/18/2017  . Atypical atrial flutter ([place]) 06/18/2017  . Personality disorder ([place]) 06/13/2017  . Cellulitis 06/12/2017  . Adjustment disorder with mixed anxiety and depressed mood 06/10/2017  . Suicidal ideation 06/10/2017  . Arm wound, left, sequela 06/10/2017  . Dyspnea on exertion 05/29/2017  . Tachycardia 05/29/2017  . Hyperkalemia 05/22/2017  . Acute metabolic encephalopathy   . Anemia 04/23/2017  . Ascites 04/23/2017  . COPD (chronic obstructive pulmonary disease) ([place]) 04/23/2017  . Acute on chronic respiratory failure with hypoxia ([place]) 03/25/2017  . Arrhythmia 03/25/2017  . COPD GOLD 0 with flattening on inps f/v  09/27/2016  . Essential hypertension 09/27/2016  . Fluid overload 08/30/2016  . COPD exacerbation ([place]) 08/17/2016  . Hypertensive urgency 08/17/2016  . Problem with dialysis access ([place]) 07/23/2016  . Chronic hepatitis B ([place]) 03/05/2014  . Chronic hepatitis C without hepatic coma ([place]) 03/05/2014  . Internal hemorrhoids with bleeding, swelling and itching 03/05/2014  . Thrombocytopenia ([place]) 03/05/2014  . Chest pain 02/27/2014  . Alcohol abuse 04/14/2009  . Nicotine dependence, cigarettes, uncomplicated 62/09/5595  . GANGLION CYST 04/14/2009    Past Surgical History:  Procedure Laterality Date  . A/V FISTULAGRAM Left 05/26/2017   Procedure: A/V FISTULAGRAM;  Surgeon: Conrad [place], MD;  Location: [facility];  Service: Cardiovascular;  Laterality: Left;  . A/V FISTULAGRAM Right 11/18/2017   Procedure: A/V FISTULAGRAM - Right Arm;  Surgeon: Elam Dutch, MD;  Location: [facility];  Service: Cardiovascular;   Laterality: Right;  . AMPUTATION Right 09/04/2020   Procedure: RIGHT INDEX AND RIGHT RING FINGER AMPUTATION DIGIT;  Surgeon: Leanora Cover, MD;  Location: [facility];  Service: Orthopedics;  Laterality: Right;  . APPLICATION OF WOUND VAC Left 06/14/2017   Procedure: APPLICATION OF WOUND VAC;  Surgeon: Katha Cabal, MD;  Location: [facility];  Service: Vascular;  Laterality: Left;  . AV FISTULA PLACEMENT  2012   BELIEVED WAS PLACED IN JUNE  . AV FISTULA PLACEMENT Right 08/09/2017   Procedure: Creation Right arm ARTERIOVENOUS BRACHIOCEPOHALIC FISTULA;  Surgeon: Elam Dutch, MD;  [facility];  Service: Vascular;  Laterality: Right;  . AV FISTULA PLACEMENT Right 11/22/2017   Procedure: INSERTION OF ARTERIOVENOUS (AV) GORE-TEX GRAFT RIGHT UPPER ARM;  Surgeon: Elam Dutch, MD;  Location: [facility];  Service: Vascular;  Laterality: Right;  . BIOPSY  01/25/2018   Procedure: BIOPSY;  Surgeon: Jerene Bears, MD;  Location: [facility];  Service: Gastroenterology;;  . BIOPSY  04/10/2019   Procedure: BIOPSY;  Surgeon: Jerene Bears, MD;  Location: [facility];  Service:  Gastroenterology;;  . COLONOSCOPY    . COLONOSCOPY WITH PROPOFOL N/A 01/25/2018   Procedure: COLONOSCOPY WITH PROPOFOL;  Surgeon: Jerene Bears, MD;  Location: [facility];  Service: Gastroenterology;  Laterality: N/A;  . CORONARY STENT INTERVENTION N/A 02/22/2017   Procedure: CORONARY STENT INTERVENTION;  Surgeon: Nigel Mormon, MD;  Location: [facility];  Service: Cardiovascular;  Laterality: N/A;  . ESOPHAGOGASTRODUODENOSCOPY (EGD) WITH PROPOFOL N/A 01/25/2018   Procedure: ESOPHAGOGASTRODUODENOSCOPY (EGD) WITH PROPOFOL;  Surgeon: Jerene Bears, MD;  Location: [facility];  Service: Gastroenterology;  Laterality: N/A;  . ESOPHAGOGASTRODUODENOSCOPY (EGD) WITH PROPOFOL N/A 04/10/2019   Procedure: ESOPHAGOGASTRODUODENOSCOPY (EGD) WITH PROPOFOL;  Surgeon: Jerene Bears, MD;  [facility];  Service:  Gastroenterology;  Laterality: N/A;  . FLEXIBLE SIGMOIDOSCOPY N/A 07/15/2017   Procedure: FLEXIBLE SIGMOIDOSCOPY;  Surgeon: Carol Ada, MD;  Location: [facility];  Service: Endoscopy;  Laterality: N/A;  . HEMORRHOID BANDING    . I & D EXTREMITY Left 06/01/2017   Procedure: IRRIGATION AND DEBRIDEMENT LEFT ARM HEMATOMA WITH LIGATION OF LEFT ARM AV FISTULA;  Surgeon: Elam Dutch, MD;  Location: [facility];  Service: Vascular;  Laterality: Left;  . I & D EXTREMITY Left 06/14/2017   Procedure: IRRIGATION AND DEBRIDEMENT EXTREMITY;  Surgeon: Katha Cabal, MD;  Location: [facility];  Service: Vascular;  Laterality: Left;  . INSERTION OF DIALYSIS CATHETER  05/30/2017  . INSERTION OF DIALYSIS CATHETER N/A 05/30/2017   Procedure: INSERTION OF DIALYSIS CATHETER;  Surgeon: Elam Dutch, MD;  Location: [facility];  Service: Vascular;  Laterality: N/A;  . IR PARACENTESIS  08/30/2017  . IR PARACENTESIS  09/29/2017  . IR PARACENTESIS  10/28/2017  . IR PARACENTESIS  11/09/2017  . IR PARACENTESIS  11/16/2017  . IR PARACENTESIS  11/28/2017  . IR PARACENTESIS  12/01/2017  . IR PARACENTESIS  12/06/2017  . IR PARACENTESIS  01/03/2018  . IR PARACENTESIS  01/23/2018  . IR PARACENTESIS  02/07/2018  . IR PARACENTESIS  02/21/2018  . IR PARACENTESIS  03/06/2018  . IR PARACENTESIS  03/17/2018  . IR PARACENTESIS  04/04/2018  . IR PARACENTESIS  12/28/2018  . IR PARACENTESIS  01/08/2019  . IR PARACENTESIS  01/23/2019  . IR PARACENTESIS  02/01/2019  . IR PARACENTESIS  02/19/2019  . IR PARACENTESIS  03/01/2019  . IR PARACENTESIS  03/15/2019  . IR PARACENTESIS  04/03/2019  . IR PARACENTESIS  04/12/2019  . IR PARACENTESIS  05/01/2019  . IR PARACENTESIS  05/08/2019  . IR PARACENTESIS  05/24/2019  . IR PARACENTESIS  06/12/2019  . IR PARACENTESIS  07/09/2019  . IR PARACENTESIS  07/27/2019  . IR PARACENTESIS  08/09/2019  . IR PARACENTESIS  08/21/2019  . IR PARACENTESIS  09/17/2019  . IR PARACENTESIS  10/05/2019  . IR PARACENTESIS   10/29/2019  . IR PARACENTESIS  11/08/2019  . IR PARACENTESIS  12/12/2019  . IR PARACENTESIS  01/03/2020  . IR PARACENTESIS  01/10/2020  . IR PARACENTESIS  01/17/2020  . IR PARACENTESIS  01/24/2020  . IR PARACENTESIS  01/31/2020  . IR PARACENTESIS  02/07/2020  . IR PARACENTESIS  02/21/2020  . IR PARACENTESIS  05/21/2020  . IR RADIOLOGIST EVAL & MGMT  02/14/2018  . IR RADIOLOGIST EVAL & MGMT  02/22/2019  . LEFT HEART CATH AND CORONARY ANGIOGRAPHY N/A 02/22/2017   Procedure: LEFT HEART CATH AND CORONARY ANGIOGRAPHY;  Surgeon: Nigel Mormon, MD;  Location: [facility];  Service: Cardiovascular;  Laterality: N/A;  . LIGATION  OF ARTERIOVENOUS  FISTULA Left 09/16/1694   Procedure: Plication of Left Arm Arteriovenous Fistula;  Surgeon: Elam Dutch, MD;  Location: [facility];  Service: Vascular;  Laterality: Left;  . POLYPECTOMY    . POLYPECTOMY  01/25/2018   Procedure: POLYPECTOMY;  Surgeon: Jerene Bears, MD;  Location: [facility];  Service: Gastroenterology;;  . REVISON OF ARTERIOVENOUS FISTULA Left 7/89/3810   Procedure: PLICATION OF DISTAL ANEURYSMAL SEGEMENT OF LEFT UPPER ARM ARTERIOVENOUS FISTULA;  Surgeon: Elam Dutch, MD;  Location: [facility];  Service: Vascular;  Laterality: Left;  . REVISON OF ARTERIOVENOUS FISTULA Left 1/75/1025   Procedure: Plication of Left Upper Arm Fistula ;  Surgeon: Waynetta Sandy, MD;  Location: [facility];  Service: Vascular;  Laterality: Left;  . SKIN GRAFT SPLIT THICKNESS LEG / FOOT Left    SKIN GRAFT SPLIT THICKNESS LEFT ARM DONOR SITE: LEFT ANTERIOR THIGH  . SKIN SPLIT GRAFT Left 07/04/2017   Procedure: SKIN GRAFT SPLIT THICKNESS LEFT ARM DONOR SITE: LEFT ANTERIOR THIGH;  Surgeon: Elam Dutch, MD;  Location: [facility];  Service: Vascular;  Laterality: Left;  . THROMBECTOMY W/ EMBOLECTOMY Left 06/05/2017   Procedure: EXPLORATION OF LEFT ARM FOR BLEEDING; OVERSEWED PROXIMAL FISTULA;  Surgeon: Angelia Mould, MD;  Location: [facility];  Service:  Vascular;  Laterality: Left;  . WOUND EXPLORATION Left 06/03/2017   Procedure: WOUND EXPLORATION WITH WOUND VAC APPLICATION TO LEFT ARM;  Surgeon: Angelia Mould, MD;  Location[facility] OR;  Service: Vascular;  Laterality: Left;       Family History  Problem Relation Age of Onset  . Heart disease Mother   . Lung cancer Mother   . Heart disease Father   . Malignant hyperthermia Father   . COPD Father   . Throat cancer Sister   . Esophageal cancer Sister   . Hypertension Other   . COPD Other   . Throat cancer Sister   . Colon cancer Neg Hx   . Colon polyps Neg Hx   . Rectal cancer Neg Hx   . Stomach cancer Neg Hx     Social History   Tobacco Use  . Smoking status: Current Every Day Smoker    Packs/day: 1.00    Years: 43.00    Pack years: 43.00    Types: Cigarettes    Start date: 08/13/1973  . Smokeless tobacco: Never Used  Vaping Use  . Vaping Use: Never used  Substance Use Topics  . Alcohol use: Not Currently    Comment: quit drinking in 2017  . Drug use: Yes    Types: Marijuana, Cocaine    Comment: reports using once every 3 months,  Quit 04-06-2019    Home Medications Prior to Admission medications   Medication Sig Start Date End Date Taking? Authorizing Provider  albuterol (VENTOLIN HFA) 108 (90 Base) MCG/ACT inhaler INHALE 2 PUFFS INTO THE LUNGS EVERY 4 HOURS AS NEEDED FOR WHEEZING OR SHORTNESS OF BREATH. 09/14/20   Charlott Rakes, MD  amiodarone (PACERONE) 200 MG tablet Take 1 tablet (200 mg total) by mouth daily. 07/22/20   Cantwell, Celeste C, PA-C  cetirizine (ZYRTEC) 10 MG tablet Take 10 mg by mouth daily as needed for allergies. 06/23/20   [provider]  cinacalcet (SENSIPAR) 30 MG tablet Take 2 tablets (60 mg total) by mouth every Monday, Wednesday, and Friday with hemodialysis. 09/15/20   Florencia Reasons, MD  diltiazem (CARDIZEM CD) 120 MG 24 hr capsule TAKE 1 CAPSULE BY MOUTH EVERY DAY  Patient taking differently: Take 120 mg by mouth daily. 06/11/20    Adrian Prows, MD  gabapentin (NEURONTIN) 300 MG capsule Take 1 capsule (300 mg total) by mouth at bedtime. 09/12/20   Florencia Reasons, MD  lactulose (CHRONULAC) 10 GM/15ML solution Take 30 mLs (20 g total) by mouth daily. 09/12/20   Florencia Reasons, MD  metoprolol succinate (TOPROL XL) 25 MG 24 hr tablet Take 1 tablet (25 mg total) by mouth daily. 07/22/20   Cantwell, Celeste C, PA-C  multivitamin (RENA-VIT) TABS tablet Take 1 tablet by mouth at bedtime. 09/12/20   Florencia Reasons, MD  Nutritional Supplements (FEEDING SUPPLEMENT, NEPRO CARB STEADY,) LIQD Take 237 mLs by mouth 3 (three) times daily between meals. 09/12/20   Florencia Reasons, MD  ondansetron (ZOFRAN) 4 MG tablet TAKE 1 TABLET BY MOUTH EVERY 8 HOURS AS NEEDED FOR NAUSEA AND VOMITING Patient taking differently: Take 4 mg by mouth every 8 (eight) hours as needed for nausea or vomiting. 08/29/20   Charlott Rakes, MD  Oxycodone HCl 10 MG TABS Take 1 tablet (10 mg total) by mouth 3 (three) times daily as needed (pain). 09/12/20   Florencia Reasons, MD  pantoprazole (PROTONIX) 40 MG tablet Take 1 tablet (40 mg total) by mouth 2 (two) times daily. 03/25/20   Cantwell, Celeste C, PA-C  sevelamer carbonate (RENVELA) 800 MG tablet Take 2,400 mg by mouth See admin instructions. Take 3 tablets (2400 mg) by mouth up to three times daily with meals    [provider]  sulfamethoxazole-trimethoprim (BACTRIM DS) 800-160 MG tablet Take 1 tablet by mouth daily. 09/12/20   Florencia Reasons, MD  tenofovir [place] Holes) 300 MG tablet Take 1 by mouth every Monday. Patient taking differently: Take 300 mg by mouth once a week. 05/22/20   Maness, Arnette Norris, MD    Allergies    Tramadol, Grass extracts [gramineae pollens], Morphine and related, Pollen extract, Acetaminophen, Aspirin, and Clonidine derivatives  Review of Systems   Review of Systems  Constitutional: Negative for fatigue and fever.  All other systems reviewed and are negative.   Physical Exam Updated Vital Signs BP (!) 109/44 (BP Location: Left  Arm)   Pulse 79   Temp 98.7 F (37.1 C)   Resp (!) 25   SpO2 94%   Physical Exam Vitals and nursing note reviewed.  Constitutional:      General: He is not in acute distress.    Appearance: He is well-developed.  HENT:     Head: Normocephalic and atraumatic.  Eyes:     Conjunctiva/sclera: Conjunctivae normal.     Pupils: Pupils are equal, round, and reactive to light.  Cardiovascular:     Rate and Rhythm: Normal rate and regular rhythm.     Heart sounds: Normal heart sounds.  Pulmonary:     Effort: Pulmonary effort is normal. No respiratory distress.     Breath sounds: Normal breath sounds.  Abdominal:     General: There is no distension.     Palpations: Abdomen is soft.     Tenderness: There is no abdominal tenderness.  Musculoskeletal:        General: No deformity. Normal range of motion.     Cervical back: Normal range of motion and neck supple.  Skin:    General: Skin is warm and dry.  Neurological:     General: No focal deficit present.     Mental Status: He is alert. He is disoriented.     ED Results / Procedures / Treatments  Labs (all labs ordered are listed, but only abnormal results are displayed) Labs Reviewed  BASIC METABOLIC PANEL - Abnormal; Notable for the following components:      Result Value   Chloride 93 (*)    Glucose, Bld 126 (*)    Creatinine, Ser 6.52 (*)    Calcium 8.4 (*)    GFR, Estimated 9 (*)    Anion gap 16 (*)    All other components within normal limits  CBC WITH DIFFERENTIAL/PLATELET - Abnormal; Notable for the following components:   RBC 3.91 (*)    Hemoglobin 11.5 (*)    HCT 32.3 (*)    RDW 16.6 (*)    All other components within normal limits    EKG EKG Interpretation  Date/Time:  Saturday September 20 2020 14:28:10 EST Ventricular Rate:  76 PR Interval:    QRS Duration: 170 QT Interval:  562 QTC Calculation: 632 R Axis:   -92 Text Interpretation: Normal sinus rhythm Left ventricular hypertrophy Confirmed by  Dene Gentry [phone]) on 09/20/2020 3:00:14 PM   Radiology DG Chest Port 1 View  Result Date: 09/20/2020 CLINICAL DATA:  Shortness of breath EXAM: PORTABLE CHEST 1 VIEW COMPARISON:  09/07/2020 FINDINGS: Cardiomegaly. Both lungs are clear. The visualized skeletal structures are unremarkable. IMPRESSION: Cardiomegaly without acute abnormality of the lungs in AP portable projection. Electronically Signed   By: Eddie Candle M.D.   On: 09/20/2020 15:25    Procedures Procedures   Medications Ordered in ED Medications  oxyCODONE (Oxy IR/ROXICODONE) immediate release tablet 5 mg (5 mg Oral Given 09/20/20 1556)    ED Course  I have reviewed the triage vital signs and the nursing notes.  Pertinent labs & imaging results that were available during my care of the patient were reviewed by me and considered in my medical decision making (see chart for details).  Clinical Course as of 09/20/20 1640  Sat Sep 20, 2020  1542 Potassium: 4.0 [PM]    Clinical Course User Index [PM] Valarie Merino, MD   MDM Rules/Calculators/A&P                          MDM  Screen complete  [name] was evaluated in Emergency Department on 09/20/2020 for the symptoms described in the history of present illness. He was evaluated in the context of the global COVID-19 pandemic, which necessitated consideration that the patient might be at risk for infection with the SARS-CoV-2 virus that causes COVID-19. Institutional protocols and algorithms that pertain to the evaluation of patients at risk for COVID-19 are in a state of rapid change based on information released by regulatory bodies including the CDC and federal and state organizations. These policies and algorithms were followed during the patient's care in the ED.  Patient is presenting with complaint of pain.  Patient is otherwise without specific acute complaint.  [place] labs are without significant abnormality.  Patient was provided with 5 mg  of oxycodone in the ED.  He reports feeling better.  He then asked for something to eat and desired discharge.  Patient was observed to be ambulating around the ED without difficulty.  Patient does understand need for close follow-up with his regular care providers.  He understands need to maintain good compliance with his dialysis sessions.   Final Clinical Impression(s) / ED Diagnoses Final diagnoses:  ESRD (end stage renal disease) ([place])    Rx / DC Orders ED Discharge Orders  None       Valarie Merino, MD 09/20/20 [phone]

## 2020-09-20 NOTE — ED Notes (Signed)
Pt was demanding more pain medication at this time. MD has talked to pt that he has to go back to his family doctor for pain meds. Pt is also being seen at pain clinic. Pt became angry and is upset with discharge dispo. Cleared for discharge at this time. Ambulated on steady gait.

## 2020-09-25 ENCOUNTER — Ambulatory Visit: Payer: Medicare Other | Admitting: Family Medicine

## 2020-09-28 ENCOUNTER — Other Ambulatory Visit: Payer: Self-pay | Admitting: Student

## 2020-10-02 IMAGING — US IR PARACENTESIS
1 series · 5 of 5 positions shown · non-contrast
Comparison: none

INDICATION: Patient with history of alcoholic cirrhosis, ESRD, hepatitis B/C,
abdominal distension, and recurrent ascites. Request is made for
diagnostic and therapeutic paracentesis with a max 6 L.

[Series 1: ir (id) (id)/(id)/(id) ir · 5 of 5 slices shown]
[im 1/5]
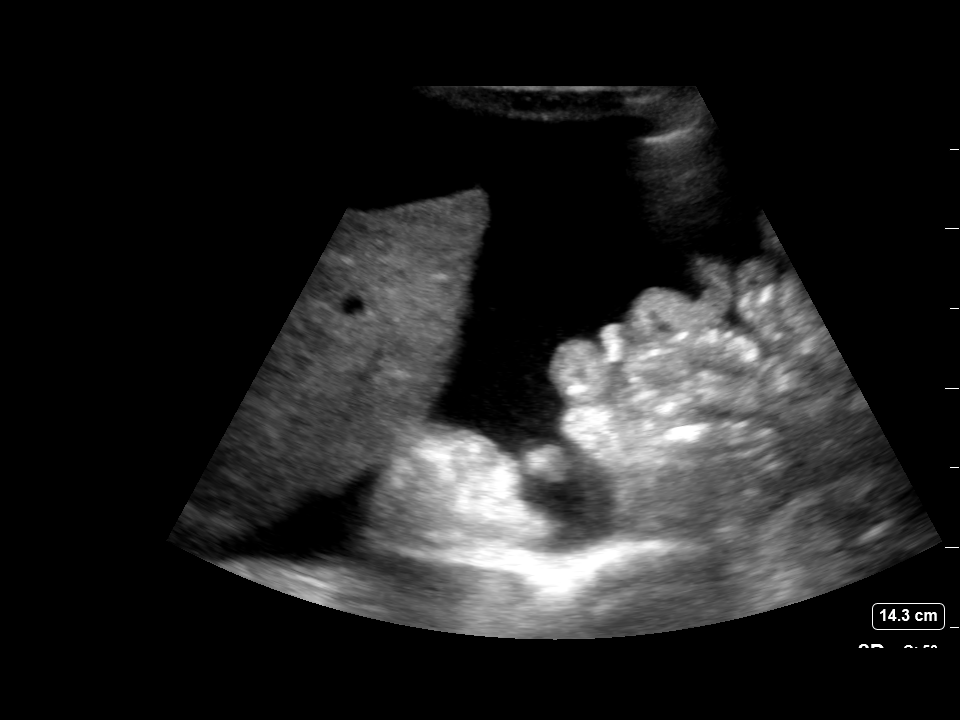
[im 2/5]
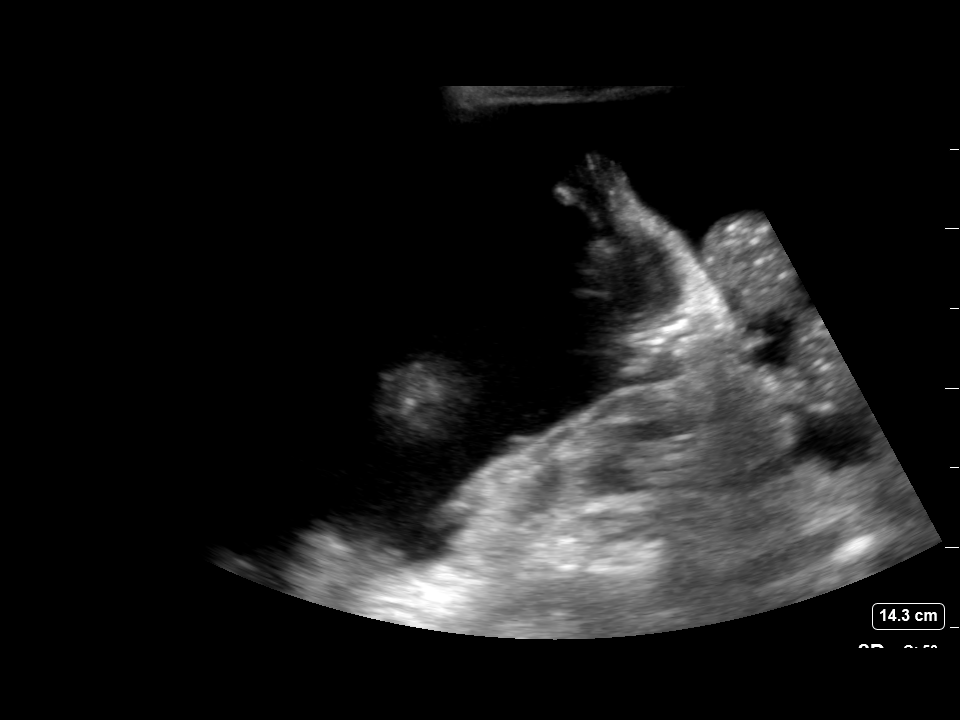
[im 3/5]
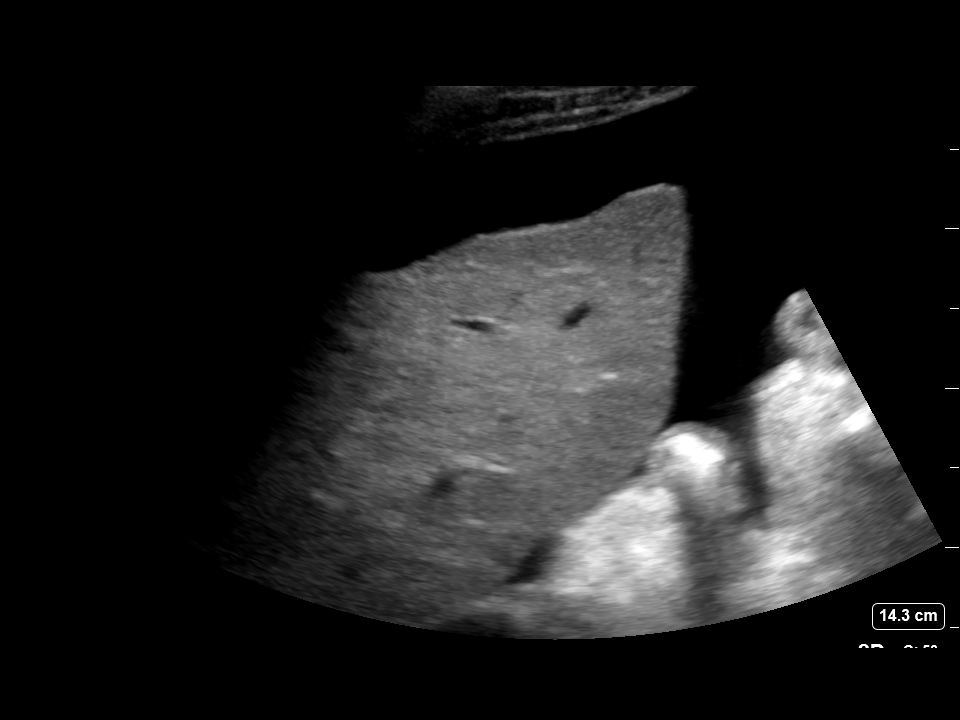
[im 4/5]
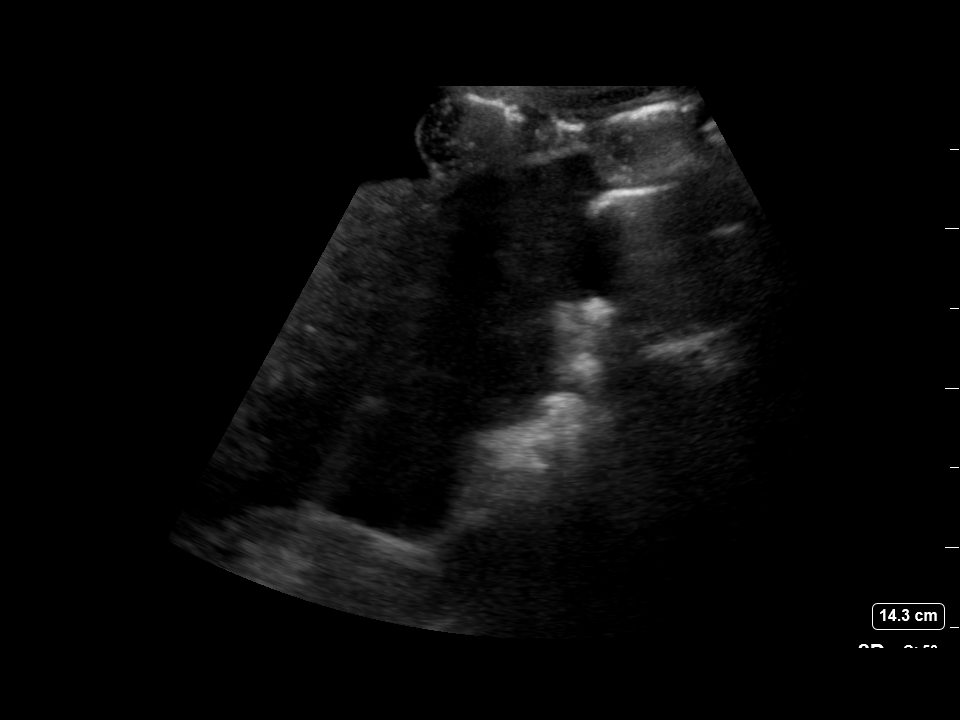
[im 5/5]
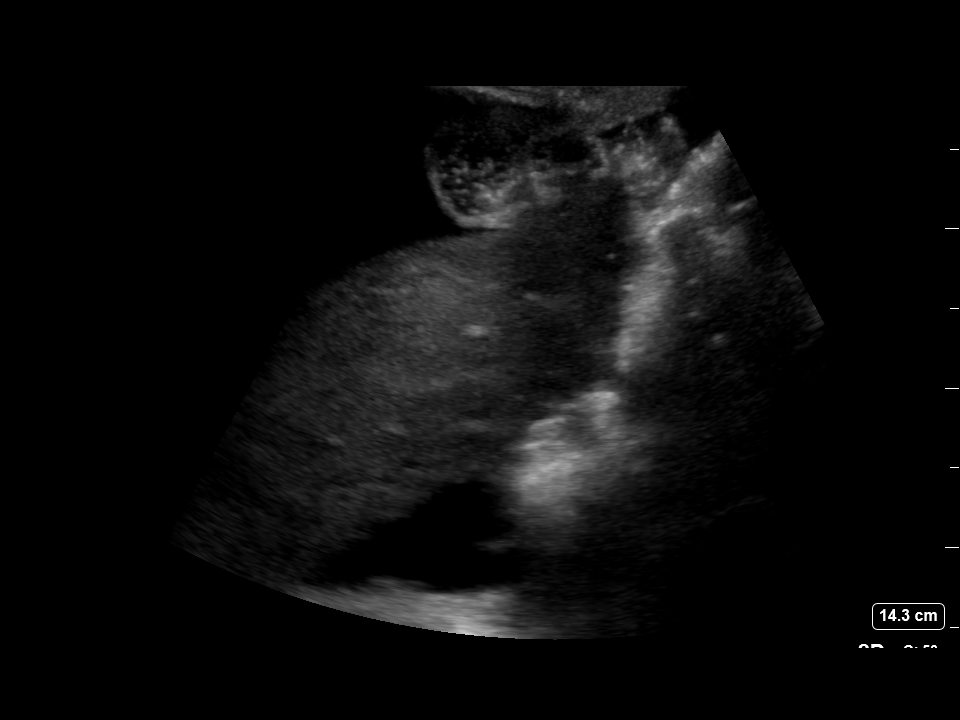

[5 of 5 positions shown; findings below may reference images not displayed]

EXAM:
ULTRASOUND GUIDED DIAGNOSTIC AND THERAPEUTIC PARACENTESIS

MEDICATIONS:
10 mL 1% lidocaine

COMPLICATIONS:
None immediate.

PROCEDURE:
Informed written consent was obtained from the patient after a
discussion of the risks, benefits and alternatives to treatment. A
timeout was performed prior to the initiation of the procedure.

Initial ultrasound scanning demonstrates a large amount of ascites
within the right lower abdominal quadrant. The right lower abdomen
was prepped and draped in the usual sterile fashion. 1% lidocaine
was used for local anesthesia.

Following this, a 19 gauge, 7-cm, Yueh catheter was introduced. An
ultrasound image was saved for documentation purposes. The
paracentesis was performed. The catheter was removed and a dressing
was applied. The patient tolerated the procedure well without
immediate post procedural complication.
FINDINGS: A total of approximately 5.5 L of hazy gold fluid was removed.
Samples were sent to the laboratory as requested by the clinical
team.
IMPRESSION: Successful ultrasound-guided paracentesis yielding 5.5 L of
peritoneal fluid.

## 2020-10-07 ENCOUNTER — Encounter: Payer: Self-pay | Admitting: Student

## 2020-10-07 ENCOUNTER — Ambulatory Visit: Payer: Self-pay

## 2020-10-07 DIAGNOSIS — K7290 Hepatic failure, unspecified without coma: Secondary | ICD-10-CM

## 2020-10-07 MED ORDER — ALBUMIN HUMAN 25 % IV SOLN: 25.0000 g | Freq: Once | INTRAVENOUS | 0 refills | Status: AC

## 2020-10-07 NOTE — Progress Notes (Signed)
Patient came to the office lobby without an appointment, requesting that our office call the hospital and coordinate and urgent paracentesis. Patient notably volume overloaded. He was advised to go the the emergency department.

## 2020-10-07 NOTE — Telephone Encounter (Signed)
Pt is requesting order for Paracentesis done ASAP patient states that he is having very bad abdominal pain and he can not eat. Last Paracentesis was done in 11/21

## 2020-10-07 NOTE — Addendum Note (Signed)
Addended byCharlott Rakes on: 10/07/2020 06:24 PM   Modules accepted: Orders

## 2020-10-07 NOTE — Telephone Encounter (Signed)
Paracentesis- [facility] Dr Posey Pronto. Pt having painPain SOB last paracentesis was 05/21/20 IR at [facility].  Pt is requesting Dr. Margarita Rana order procedure. Called office and transferred to Dr. Benard Rink nurse  to order paracentesis.

## 2020-10-07 NOTE — Telephone Encounter (Signed)
Order has been placed as well as order for albumin 25g please inform patient and Radiology Dept. Thanks

## 2020-10-08 NOTE — Telephone Encounter (Signed)
Pt has been called and informed of order being placed and appointment date and time.

## 2020-10-10 ENCOUNTER — Ambulatory Visit (HOSPITAL_COMMUNITY)
Admission: RE | Admit: 2020-10-10 | Discharge: 2020-10-10 | Disposition: A | Discharge status: Home / Self Care | Payer: Medicare Other | Source: Ambulatory Visit | Attending: Family Medicine | Admitting: Family Medicine

## 2020-10-10 ENCOUNTER — Other Ambulatory Visit: Payer: Self-pay

## 2020-10-10 DIAGNOSIS — K7460 Unspecified cirrhosis of liver: Secondary | ICD-10-CM | POA: Diagnosis not present

## 2020-10-10 DIAGNOSIS — K7290 Hepatic failure, unspecified without coma: Secondary | ICD-10-CM | POA: Diagnosis not present

## 2020-10-10 DIAGNOSIS — R188 Other ascites: Secondary | ICD-10-CM | POA: Diagnosis not present

## 2020-10-10 HISTORY — PX: IR PARACENTESIS: IMG2679

## 2020-10-10 MED ORDER — LIDOCAINE HCL 1 % IJ SOLN
INTRAMUSCULAR | Status: AC
  Filled 2020-10-10: qty 20

## 2020-10-10 MED ORDER — LIDOCAINE HCL (PF) 1 % IJ SOLN
INTRAMUSCULAR | Status: AC | PRN
  Administered 2020-10-10: 5 mL

## 2020-10-10 NOTE — Procedures (Signed)
Ultrasound-guided therapeutic paracentesis performed yielding 4.2  liters of amber colored fluid. No immediate complications. EBL is none.

## 2020-10-11 IMAGING — US IR PARACENTESIS
1 series · 2 of 2 positions shown · non-contrast
Comparison: none

INDICATION: History of alcoholic cirrhosis with recurrent ascites. Request for
diagnostic and therapeutic paracentesis.

[Series 1: ir (id) (id)/(id)/(id) ir · 2 of 2 slices shown]
[im 1/2]
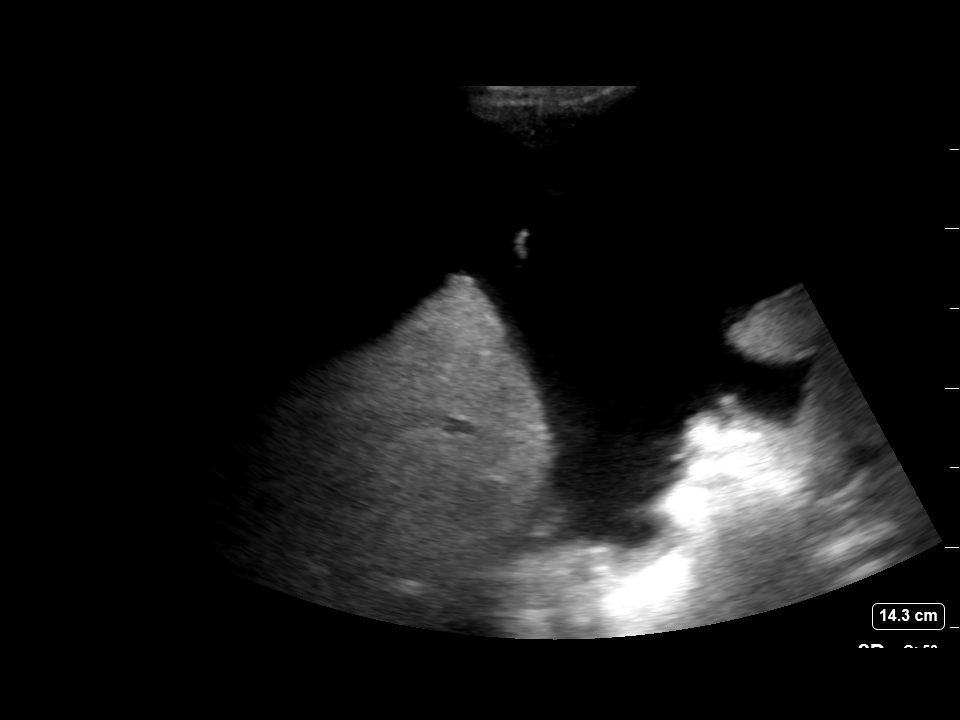
[im 2/2]
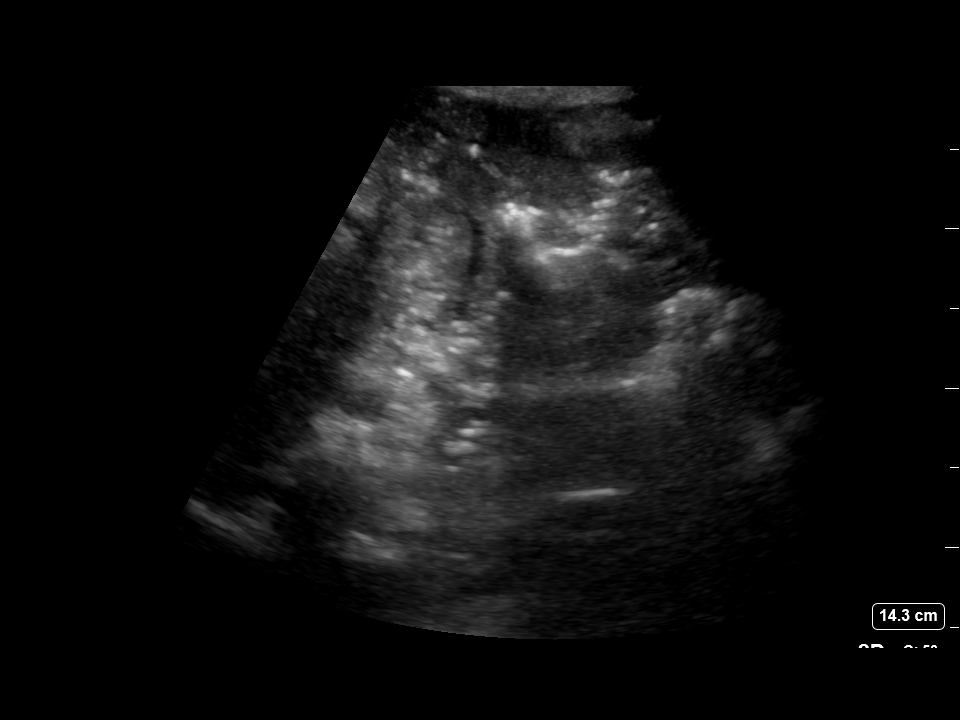

[2 of 2 positions shown; findings below may reference images not displayed]

EXAM:
ULTRASOUND GUIDED RIGHT LOWER QUADRANT PARACENTESIS

MEDICATIONS:
None.

COMPLICATIONS:
None immediate.

PROCEDURE:
Informed written consent was obtained from the patient after a
discussion of the risks, benefits and alternatives to treatment. A
timeout was performed prior to the initiation of the procedure.

Initial ultrasound scanning demonstrates a large amount of ascites
within the right lower abdominal quadrant. The right lower abdomen
was prepped and draped in the usual sterile fashion. 1% lidocaine
was used for local anesthesia.

Following this, a 6 Fr Safe-T-Centesis catheter was introduced. An
ultrasound image was saved for documentation purposes. The
paracentesis was performed. The catheter was removed and a dressing
was applied. The patient tolerated the procedure well without
immediate post procedural complication.
Patient did not receive post-procedure intravenous albumin.
FINDINGS: A total of approximately 3.75 L of hazy, dark yellow fluid was
removed. Samples were sent to the laboratory as requested by the
clinical team.
IMPRESSION: Successful ultrasound-guided paracentesis yielding 3.75 liters of
peritoneal fluid.

## 2020-10-15 IMAGING — DX DG CHEST 1V PORT
1 series · 1 of 1 positions shown · non-contrast
Comparison: 02/09/2019

CLINICAL DATA: Shortness of breath

EXAM:
PORTABLE CHEST 1 VIEW

[chest ap]
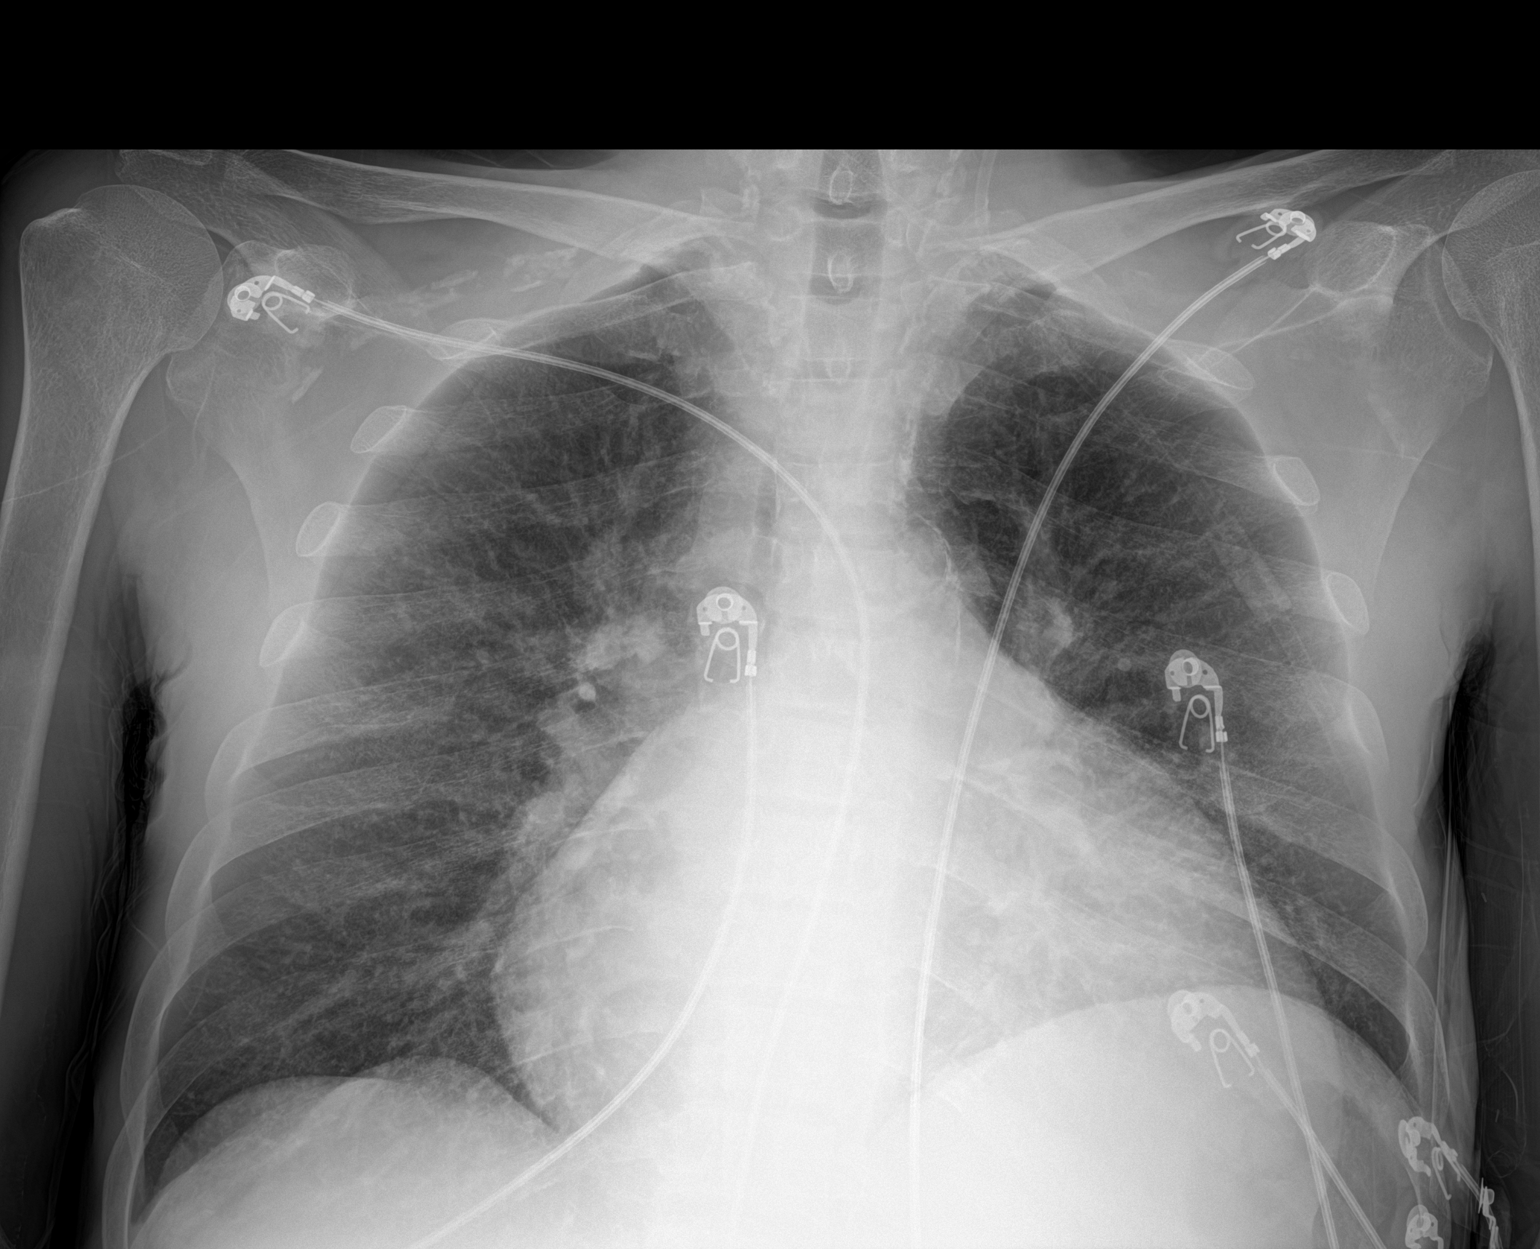

[1 of 1 positions shown; findings below may reference images not displayed]

FINDINGS: Carotid calcifications. Stable moderate cardiomegaly. No
consolidation, pleural effusion, or overt pulmonary edema.
IMPRESSION: No active disease.  Stable cardiomegaly.

## 2020-10-20 ENCOUNTER — Telehealth: Payer: Self-pay | Admitting: Family Medicine

## 2020-10-20 DIAGNOSIS — R188 Other ascites: Secondary | ICD-10-CM

## 2020-10-20 DIAGNOSIS — K7460 Unspecified cirrhosis of liver: Secondary | ICD-10-CM

## 2020-10-20 NOTE — Telephone Encounter (Signed)
Pt states he needs Dr Margarita Rana to call the hospital and get him appointment for  Paracentesis. Pt states he needs this really badly.  Hopes the dr can call within the hour.  Pt wants to know when she calls, please call him.

## 2020-10-21 ENCOUNTER — Other Ambulatory Visit: Payer: Self-pay | Admitting: Family Medicine

## 2020-10-21 DIAGNOSIS — K219 Gastro-esophageal reflux disease without esophagitis: Secondary | ICD-10-CM

## 2020-10-21 DIAGNOSIS — R110 Nausea: Secondary | ICD-10-CM

## 2020-10-21 NOTE — Telephone Encounter (Signed)
Will route to PCP for review.  Patient had procedure done on 10/10/2020 and requesting another one.

## 2020-10-21 NOTE — Telephone Encounter (Signed)
Spoke to the patient, his ascites has recurred , asking for another drainage.   IR paracentesis ordered for 4L max to be drained

## 2020-10-21 NOTE — Telephone Encounter (Signed)
Requested medication (s) are due for refill today: yes  Requested medication (s) are on the active medication list: yes  Last refill:  08/29/20 #30 3 refills  Future visit scheduled: yes  Notes to clinic:  not delegated per protocol. Pharmacy requesting dx code.     Requested Prescriptions  Pending Prescriptions Disp Refills   ondansetron (ZOFRAN) 4 MG tablet [Pharmacy Med Name: ONDANSETRON HCL 4 MG TABLET] 30 tablet 3    Sig: TAKE 1 TABLET BY MOUTH EVERY 8 HOURS AS NEEDED FOR NAUSEA AND VOMITING      Not Delegated - Gastroenterology: Antiemetics Failed - 10/21/2020  1:21 PM      Failed - This refill cannot be delegated      Passed - Valid encounter within last 6 months    Recent Outpatient Visits           2 months ago Felon of finger of right hand   [place], Charlane Ferretti, MD   3 months ago Gross hematuria   [place], Charlane Ferretti, MD   3 months ago AF (paroxysmal atrial fibrillation) [facility])   [place], Enobong, MD       Future Appointments             In 6 days [place], Gerline Legacy, [place] [facility]   In 1 month Charlott Rakes, MD [place]

## 2020-10-21 NOTE — Telephone Encounter (Signed)
Pt has been scheduled and Vm was left informing patient of appointment.

## 2020-10-26 NOTE — Progress Notes (Signed)
Primary Physician/Referring:  Charlott Rakes, MD  Patient ID: [name], male    DOB: 1961-04-13, 60 y.o.   MRN: [id number]  No chief complaint on file.  HPI:    [name]  is a 60 y.o. male  with very complex medical problems including severe COPD and chronic cor pulmonale with pulmonary hypertension, coronary artery disease s/p stenting to mid RCA in 2018, paroxysmal atrial fibrillation with RVR, hypertensive cardiomyopathy, dietary and medication noncompliance, end-stage renal disease on hemodialysis, chronic hepatitis B and C with cirrhosis of the liver and ascites, tobacco use, smokes 1/2 ppd, h/o marijuana use and prior cocaine use quit in 2017. Not on anticoagulation due to cirrhosis of liver and non-compliance with Warfarin.   He has had multiple ER visits for A fib with RVR, near syncope, shortness of breath, acute diastolic heart failure and hyperkalemia.  He has also been in the hospital with acute respiratory distress and pulmonary edema due to missed dialysis.   Since last office visit on 07/22/2020 patient has had multiple emergency department visits for necrosis of his right index and right ring fingers, underwent amputation of both by Dr. Leanora Cover.  He was hospitalized 09/07/2020 - 09/12/2020 with acute metabolic encephalopathy likely secondary to acetaminophen overdose, electrolyte abnormalities following missed dialysis, and hypoglycemia.  Patient now presents for follow-up.  Unfortunately patient did not bring his medications with him today, however he states he is taking diltiazem, metoprolol, and amiodarone as directed.  He states he is feeling well overall since recent hospitalization.  Denies chest pain, palpitations, dizziness, syncope, near syncope.  He denies orthopnea, PND, leg edema.  Patient's primary concern today is that he has been without albuterol and Symbicort inhalers for the last several days, and he is requesting a refill.  Past  Medical History:  Diagnosis Date  . Anemia   . Anxiety   . Arthritis    left shoulder  . Atherosclerosis of aorta ([place])   . Cardiomegaly   . Chest pain    DATE UNKNOWN, C/O PERIODICALLY  . Cocaine abuse ([place])   . COPD exacerbation ([place]) 08/17/2016  . Coronary artery disease    stent 02/22/17  . Dysrhythmia   . ESRD (end stage renal disease) on dialysis ([place])    "[name]; MWF" (07/04/2017)  . GERD (gastroesophageal reflux disease)    DATE UNKNOWN  . Hemorrhoids   . Hepatitis B, chronic ([place])   . Hepatitis C   . History of kidney stones   . Hyperkalemia   . Hypertension   . Kidney failure   . Metabolic bone disease    Patient denies  . Mitral stenosis   . Myocardial infarction ([place])   . Pneumonia   . Pulmonary edema   . Solitary rectal ulcer syndrome 07/2017   at flex sig for rectal bleeding  . Tubular adenoma of colon    Past Surgical History:  Procedure Laterality Date  . A/V FISTULAGRAM Left 05/26/2017   Procedure: A/V FISTULAGRAM;  Surgeon: Conrad Redfield, MD;  Location: [place] [facility];  Service: Cardiovascular;  Laterality: Left;  . A/V FISTULAGRAM Right 11/18/2017   Procedure: A/V FISTULAGRAM - Right Arm;  Surgeon: Elam Dutch, MD;  Location: [place] [facility];  Service: Cardiovascular;  Laterality: Right;  . AMPUTATION Right 09/04/2020   Procedure: RIGHT INDEX AND RIGHT RING FINGER AMPUTATION DIGIT;  Surgeon: Leanora Cover, MD;  Location: [place];  Service: Orthopedics;  Laterality: Right;  . APPLICATION OF  WOUND VAC Left 06/14/2017   Procedure: APPLICATION OF WOUND VAC;  Surgeon: Katha Cabal, MD;  Location: [facility];  Service: Vascular;  Laterality: Left;  . AV FISTULA PLACEMENT  2012   BELIEVED WAS PLACED IN JUNE  . AV FISTULA PLACEMENT Right 08/09/2017   Procedure: Creation Right arm ARTERIOVENOUS BRACHIOCEPOHALIC FISTULA;  Surgeon: Elam Dutch, MD;  Location: [facility];  Service: Vascular;  Laterality: Right;  . AV FISTULA PLACEMENT Right  11/22/2017   Procedure: INSERTION OF ARTERIOVENOUS (AV) GORE-TEX GRAFT RIGHT UPPER ARM;  Surgeon: Elam Dutch, MD;  Location: [place];  Service: Vascular;  Laterality: Right;  . BIOPSY  01/25/2018   Procedure: BIOPSY;  Surgeon: Jerene Bears, MD;  Location: [place];  Service: Gastroenterology;;  . BIOPSY  04/10/2019   Procedure: BIOPSY;  Surgeon: Jerene Bears, MD;  Location: [facility];  Service: Gastroenterology;;  . COLONOSCOPY    . COLONOSCOPY WITH PROPOFOL N/A 01/25/2018   Procedure: COLONOSCOPY WITH PROPOFOL;  Surgeon: Jerene Bears, MD;  Location: [place];  Service: Gastroenterology;  Laterality: N/A;  . CORONARY STENT INTERVENTION N/A 02/22/2017   Procedure: CORONARY STENT INTERVENTION;  Surgeon: Nigel Mormon, MD;  Location: [place] [facility];  Service: Cardiovascular;  Laterality: N/A;  . ESOPHAGOGASTRODUODENOSCOPY (EGD) WITH PROPOFOL N/A 01/25/2018   Procedure: ESOPHAGOGASTRODUODENOSCOPY (EGD) WITH PROPOFOL;  Surgeon: Jerene Bears, MD;  Location: [place];  Service: Gastroenterology;  Laterality: N/A;  . ESOPHAGOGASTRODUODENOSCOPY (EGD) WITH PROPOFOL N/A 04/10/2019   Procedure: ESOPHAGOGASTRODUODENOSCOPY (EGD) WITH PROPOFOL;  Surgeon: Jerene Bears, MD;  Location: [facility];  Service: Gastroenterology;  Laterality: N/A;  . FLEXIBLE SIGMOIDOSCOPY N/A 07/15/2017   Procedure: FLEXIBLE SIGMOIDOSCOPY;  Surgeon: Carol Ada, MD;  Location: [place];  Service: Endoscopy;  Laterality: N/A;  . HEMORRHOID BANDING    . I & D EXTREMITY Left 06/01/2017   Procedure: IRRIGATION AND DEBRIDEMENT LEFT ARM HEMATOMA WITH LIGATION OF LEFT ARM AV FISTULA;  Surgeon: Elam Dutch, MD;  Location: [place];  Service: Vascular;  Laterality: Left;  . I & D EXTREMITY Left 06/14/2017   Procedure: IRRIGATION AND DEBRIDEMENT EXTREMITY;  Surgeon: Katha Cabal, MD;  Location: [facility];  Service: Vascular;  Laterality: Left;  . INSERTION OF DIALYSIS CATHETER  05/30/2017  .  INSERTION OF DIALYSIS CATHETER N/A 05/30/2017   Procedure: INSERTION OF DIALYSIS CATHETER;  Surgeon: Elam Dutch, MD;  Location: [place];  Service: Vascular;  Laterality: N/A;  . IR PARACENTESIS  08/30/2017  . IR PARACENTESIS  09/29/2017  . IR PARACENTESIS  10/28/2017  . IR PARACENTESIS  11/09/2017  . IR PARACENTESIS  11/16/2017  . IR PARACENTESIS  11/28/2017  . IR PARACENTESIS  12/01/2017  . IR PARACENTESIS  12/06/2017  . IR PARACENTESIS  01/03/2018  . IR PARACENTESIS  01/23/2018  . IR PARACENTESIS  02/07/2018  . IR PARACENTESIS  02/21/2018  . IR PARACENTESIS  03/06/2018  . IR PARACENTESIS  03/17/2018  . IR PARACENTESIS  04/04/2018  . IR PARACENTESIS  12/28/2018  . IR PARACENTESIS  01/08/2019  . IR PARACENTESIS  01/23/2019  . IR PARACENTESIS  02/01/2019  . IR PARACENTESIS  02/19/2019  . IR PARACENTESIS  03/01/2019  . IR PARACENTESIS  03/15/2019  . IR PARACENTESIS  04/03/2019  . IR PARACENTESIS  04/12/2019  . IR PARACENTESIS  05/01/2019  . IR PARACENTESIS  05/08/2019  . IR PARACENTESIS  05/24/2019  . IR PARACENTESIS  06/12/2019  . IR PARACENTESIS  07/09/2019  . IR  PARACENTESIS  07/27/2019  . IR PARACENTESIS  08/09/2019  . IR PARACENTESIS  08/21/2019  . IR PARACENTESIS  09/17/2019  . IR PARACENTESIS  10/05/2019  . IR PARACENTESIS  10/29/2019  . IR PARACENTESIS  11/08/2019  . IR PARACENTESIS  12/12/2019  . IR PARACENTESIS  01/03/2020  . IR PARACENTESIS  01/10/2020  . IR PARACENTESIS  01/17/2020  . IR PARACENTESIS  01/24/2020  . IR PARACENTESIS  01/31/2020  . IR PARACENTESIS  02/07/2020  . IR PARACENTESIS  02/21/2020  . IR PARACENTESIS  05/21/2020  . IR PARACENTESIS  10/10/2020  . IR RADIOLOGIST EVAL & MGMT  02/14/2018  . IR RADIOLOGIST EVAL & MGMT  02/22/2019  . LEFT HEART CATH AND CORONARY ANGIOGRAPHY N/A 02/22/2017   Procedure: LEFT HEART CATH AND CORONARY ANGIOGRAPHY;  Surgeon: Nigel Mormon, MD;  Location: [place] [facility];  Service: Cardiovascular;  Laterality: N/A;  . LIGATION OF ARTERIOVENOUS  FISTULA  Left 02/10/4234   Procedure: Plication of Left Arm Arteriovenous Fistula;  Surgeon: Elam Dutch, MD;  Location: [place];  Service: Vascular;  Laterality: Left;  . POLYPECTOMY    . POLYPECTOMY  01/25/2018   Procedure: POLYPECTOMY;  Surgeon: Jerene Bears, MD;  Location: [place];  Service: Gastroenterology;;  . REVISON OF ARTERIOVENOUS FISTULA Left 3/61/4431   Procedure: PLICATION OF DISTAL ANEURYSMAL SEGEMENT OF LEFT UPPER ARM ARTERIOVENOUS FISTULA;  Surgeon: Elam Dutch, MD;  Location: [place];  Service: Vascular;  Laterality: Left;  . REVISON OF ARTERIOVENOUS FISTULA Left 5/40/0867   Procedure: Plication of Left Upper Arm Fistula ;  Surgeon: Waynetta Sandy, MD;  Location: [place];  Service: Vascular;  Laterality: Left;  . SKIN GRAFT SPLIT THICKNESS LEG / FOOT Left    SKIN GRAFT SPLIT THICKNESS LEFT ARM DONOR SITE: LEFT ANTERIOR THIGH  . SKIN SPLIT GRAFT Left 07/04/2017   Procedure: SKIN GRAFT SPLIT THICKNESS LEFT ARM DONOR SITE: LEFT ANTERIOR THIGH;  Surgeon: Elam Dutch, MD;  Location: [place];  Service: Vascular;  Laterality: Left;  . THROMBECTOMY W/ EMBOLECTOMY Left 06/05/2017   Procedure: EXPLORATION OF LEFT ARM FOR BLEEDING; OVERSEWED PROXIMAL FISTULA;  Surgeon: Angelia Mould, MD;  Location: [place];  Service: Vascular;  Laterality: Left;  . WOUND EXPLORATION Left 06/03/2017   Procedure: WOUND EXPLORATION WITH WOUND VAC APPLICATION TO LEFT ARM;  Surgeon: Angelia Mould, MD;  Location: [facility] OR;  Service: Vascular;  Laterality: Left;   Family History  Problem Relation Age of Onset  . Heart disease Mother   . Lung cancer Mother   . Heart disease Father   . Malignant hyperthermia Father   . COPD Father   . Throat cancer Sister   . Esophageal cancer Sister   . Hypertension Other   . COPD Other   . Throat cancer Sister   . Colon cancer Neg Hx   . Colon polyps Neg Hx   . Rectal cancer Neg Hx   . Stomach cancer Neg Hx     Social History   Tobacco  Use  . Smoking status: Current Every Day Smoker    Packs/day: 0.50    Years: 43.00    Pack years: 21.50    Types: Cigarettes    Start date: 08/13/1973  . Smokeless tobacco: Never Used  Substance Use Topics  . Alcohol use: Not Currently    Comment: quit drinking in 2017   Marital Status: Single   ROS  Review of Systems  Constitutional: Negative for malaise/fatigue and  weight gain.  Cardiovascular: Positive for dyspnea on exertion (chronic, stable ). Negative for chest pain, claudication, leg swelling, near-syncope, orthopnea, palpitations, paroxysmal nocturnal dyspnea and syncope.  Respiratory: Negative for shortness of breath and wheezing.   Hematologic/Lymphatic: Does not bruise/bleed easily.  Gastrointestinal: Negative for hematochezia, melena and nausea.  Neurological: Negative for dizziness and weakness.   Objective  Blood pressure (!) 136/58, pulse 67, temperature 98.2 F (36.8 C), temperature source Temporal, resp. rate 17, height 5\' 9"  (1.753 m), weight 158 lb 9.6 oz (71.9 kg), SpO2 92 %.  Vitals with BMI 10/28/2020 09/20/2020 09/20/2020  Height 5\' 9"  - -  Weight 158 lbs 10 oz - -  BMI 01.77 - -  Systolic 939 030 092  Diastolic 58 78 98  Pulse 67 85 -  Some encounter information is confidential and restricted. Go to Review Flowsheets activity to see all data.     Physical Exam Vitals reviewed.  Constitutional:      General: He is not in acute distress.    Appearance: He is normal weight. He is ill-appearing.     Comments: Appears older than stated age.  Cardiovascular:     Rate and Rhythm: Normal rate. Rhythm irregularly irregular.     Heart sounds: Murmur heard.   Early systolic murmur is present with a grade of 2/6 at the upper right sternal border.  Low-pitched rumbling crescendo presystolic murmur is present with a grade of 3/4 at the apex.     Comments: S1 normal and S2 loud. No murmur. No JVD. No edema.  Right arm AV graft noted Pulmonary:     Effort:  Pulmonary effort is normal. No accessory muscle usage or respiratory distress.     Breath sounds: Normal breath sounds.  Musculoskeletal:     Right lower leg: No edema.     Left lower leg: No edema.  Neurological:     General: No focal deficit present.    Laboratory examination:   Recent Labs    02/17/20 1208 02/18/20 0002 02/18/20 0215 05/19/20 0934 09/11/20 0047 09/12/20 0339 09/20/20 1505  NA 135 131* 136   < > 135 132* 136  K 6.7* 6.5* 3.7   < > 3.2* 3.9 4.0  CL 92* 90* 96*   < > 92* 91* 93*  CO2 28 26 28    < > 22 17* 27  GLUCOSE 105* 108* 109*   < > 127* 117* 126*  BUN 40* 49* 16   < > 20 31* 17  CREATININE 5.89* 6.37* 2.90*   < > 6.68* 8.28* 6.52*  CALCIUM 9.6 9.8 9.2   < > 9.9 10.8* 8.4*  GFRNONAA 10* 9* 23*   < > 9* 7* 9*  GFRAA 11* 10* 27*  --   --   --   --    < > = values in this interval not displayed.   CrCl cannot be calculated (Patient's most recent lab result is older than the maximum 21 days allowed.).  CMP Latest Ref Rng & Units 09/20/2020 09/12/2020 09/11/2020  Glucose 70 - 99 mg/dL 126(H) 117(H) 127(H)  BUN 6 - 20 mg/dL 17 31(H) 20  Creatinine 0.61 - 1.24 mg/dL 6.52(H) 8.28(H) 6.68(H)  Sodium 135 - 145 mmol/L 136 132(L) 135  Potassium 3.5 - 5.1 mmol/L 4.0 3.9 3.2(L)  Chloride 98 - 111 mmol/L 93(L) 91(L) 92(L)  CO2 22 - 32 mmol/L 27 17(L) 22  Calcium 8.9 - 10.3 mg/dL 8.4(L) 10.8(H) 9.9  Total Protein 6.5 - 8.1  g/dL - 7.4 6.8  Total Bilirubin 0.3 - 1.2 mg/dL - 1.4(H) 1.3(H)  Alkaline Phos 38 - 126 U/L - 231(H) 199(H)  AST 15 - 41 U/L - 56(H) 81(H)  ALT 0 - 44 U/L - 19 28   CBC Latest Ref Rng & Units 09/20/2020 09/12/2020 09/12/2020  WBC 4.0 - 10.5 K/uL 9.8 11.5(H) 12.4(H)  Hemoglobin 13.0 - 17.0 g/dL 11.5(L) 11.1(L) 12.0(L)  Hematocrit 39.0 - 52.0 % 32.3(L) RESULTS UNAVAILABLE DUE TO INTERFERING SUBSTANCE RESULTS UNAVAILABLE DUE TO INTERFERING SUBSTANCE  Platelets 150 - 400 K/uL 235 133(L) 131(L)   Lipid Panel     Component Value Date/Time   CHOL 102  09/07/2020 2340   TRIG 175 (H) 09/07/2020 2340   HDL 26 (L) 09/07/2020 2340   CHOLHDL 3.9 09/07/2020 2340   VLDL 35 09/07/2020 2340   LDLCALC 41 09/07/2020 2340   HEMOGLOBIN A1C Lab Results  Component Value Date   HGBA1C 4.8 01/26/2020   MPG 91.06 01/26/2020   TSH No results for input(s): TSH in the last 8760 hours.  Lipid Panel Recent Labs    02/18/20 0001 09/07/20 2340  CHOL 140 102  TRIG 107 175*  LDLCALC 81 41  VLDL 21 35  HDL 38* 26*  CHOLHDL 3.7 3.9    External labs:   03/11/2018: TSH 1.32 normal  Medications and allergies   Allergies  Allergen Reactions  . Tramadol Itching and Other (See Comments)    Other reaction(s): Unknown  . Grass Extracts [Gramineae Pollens]     Other reaction(s): Sneezing  . Morphine And Related Other (See Comments)    Stomach pain  . Pollen Extract Other (See Comments)    Other reaction(s): Sneezing (finding)  . Acetaminophen Nausea Only    Stomach ache   . Aspirin Itching and Other (See Comments)    STOMACH PAIN  Other reaction(s): Unknown  . Clonidine Derivatives Itching     Current Outpatient Medications  Medication Instructions  . albuterol (VENTOLIN HFA) 108 (90 Base) MCG/ACT inhaler 2 puffs, Inhalation, Every 4 hours PRN  . amiodarone (PACERONE) 200 MG tablet TAKE 1 TABLET BY MOUTH EVERY DAY  . budesonide-formoterol (SYMBICORT) 160-4.5 MCG/ACT inhaler 2 puffs, Inhalation, Daily  . cetirizine (ZYRTEC) 10 mg, Oral, Daily PRN  . cinacalcet (SENSIPAR) 30 MG tablet TAKE 2 TABLETS (60 MG TOTAL) BY MOUTH EVERY MONDAY, WEDNESDAY, AND FRIDAY WITH HEMODIALYSIS.  Marland Kitchen diltiazem (CARDIZEM CD) 120 mg, Oral, Daily  . gabapentin (NEURONTIN) 300 MG capsule TAKE 1 CAPSULE (300 MG TOTAL) BY MOUTH AT BEDTIME.  Marland Kitchen lactulose (CHRONULAC) 20 g, Oral, Daily  . metoprolol succinate (TOPROL XL) 25 mg, Oral, Daily  . ondansetron (ZOFRAN) 4 mg, Oral, Every 8 hours PRN  . Oxycodone HCl 10 MG TABS TAKE 1 TABLET (10 MG TOTAL) BY MOUTH THREE TIMES  DAILY AS NEEDED PAIN.  Marland Kitchen pantoprazole (PROTONIX) 40 mg, Oral, 2 times daily   Radiology:   No results found.  Cardiac Studies:   Coronary Angiogram 02/22/2017: No significant disease on left, mid RCA high-grade stenosis status post 3.5 x 23 mm Xience [place] DES.  Carotid Doppler 10/28/2016: Stenosis in the right common carotid artery (<50%) with severe heteregenous plaque. Stenosis in the left internal carotid artery (16-49%). Severe heteregenous plaque in the left common carotid artery with < 50% stenosis. Mild stenosis in the left external carotid artery (<50%). Antegrade vertebral artery flow. Follow up in one year is appropriate if clinically indicated.  Lower Venous DVT Study 10/05/2019: BILATERAL: - No evidence  of deep vein thrombosis seen in the lower extremities, bilaterally.  RIGHT & LEFT: - No cystic structure found in the popliteal fossa.   Echocardiogram 02/05/2020: 1. Left ventricular ejection fraction, by estimation, is 50 to 55%. The left ventricle has normal function. There is severe left ventricular hypertrophy. Left ventricular diastolic function could not be evaluated. I cannot exclude septal hypokinesis. Endocardium not well visualized. septal motion also is consistent with LBBB.  2. Right ventricular systolic function is mildly reduced. The right ventricular size is moderately enlarged. Severely increased right ventricular wall thickness. There is moderately elevated pulmonary artery systolic pressure. The estimated right ventricular systolic pressure is 16.9 mmHg.  3. Left atrial size was severely dilated.  4. Right atrial size was severely dilated.  5. The mitral valve is normal in structure. Mild mitral valve regurgitation.  6. Dilated TV annulus. Tricuspid valve regurgitation is moderate to severe.  7. AV sclerosis without stenosis. The aortic valve is tricuspid. Aortic valve regurgitation is trivial. Mild aortic valve sclerosis is present, with no evidence of  aortic valve stenosis. Aortic valve mean gradient measures 8.3 mmHg.  8. The inferior vena cava is dilated in size with <50% respiratory variability, suggesting right atrial pressure of 15 mmHg.    EKG   EKG 10/28/2020: Atrial fibrillation with controlled ventricular sponsor rate of 71 bpm.  Left bundle branch block, no further analysis.  Compared to EKG 07/22/2020, no significant change.   EKG 03/25/2020: Atrial fibrillation with controlled ventricular response at a rate of 81 bpm, complete left bundle branch block. No further analysis.  Compared to EKG 02/07/2020 ventricular rate controlled.   EKG 02/07/2020: Atrial fibrillation with rapid ventricular response, ventricular rate 111 bpm, left bundle branch block.  No further analysis.  EKG 12/27/2019: Sinus rhythm with first-degree AV block at rate of 77 bpm, rare PACs, left bundle branch block.  No further analysis.  Assessment     ICD-10-CM   1. Paroxysmal atrial fibrillation (HCC)  I48.0 EKG 12-Lead    diltiazem (CARDIZEM CD) 120 MG 24 hr capsule  2. Cor pulmonale, chronic (HCC)  I27.81 budesonide-formoterol (SYMBICORT) 160-4.5 MCG/ACT inhaler    albuterol (VENTOLIN HFA) 108 (90 Base) MCG/ACT inhaler  3. Therapeutic drug monitoring  Z51.81 TSH  4. CKD (chronic kidney disease) stage V requiring chronic dialysis (HCC)  N18.6    Z99.2   5. Left bundle branch block  I44.7    .   Meds ordered this encounter  Medications  . diltiazem (CARDIZEM CD) 120 MG 24 hr capsule    Sig: Take 1 capsule (120 mg total) by mouth daily.    Dispense:  90 capsule    Refill:  3  . budesonide-formoterol (SYMBICORT) 160-4.5 MCG/ACT inhaler    Sig: Inhale 2 puffs into the lungs daily.    Dispense:  1 each    Refill:  0  . albuterol (VENTOLIN HFA) 108 (90 Base) MCG/ACT inhaler    Sig: Inhale 2 puffs into the lungs every 4 (four) hours as needed for wheezing or shortness of breath.    Dispense:  8.5 each    Refill:  0    PATIENT NEEDS REFILL     Medications Discontinued During This Encounter  Medication Reason  . lactulose, encephalopathy, (CHRONULAC) 10 GM/15ML SOLN Error  . multivitamin (RENA-VIT) TABS tablet Error  . Nutritional Supplements (FEEDING SUPPLEMENT, NEPRO CARB STEADY,) LIQD Error  . sevelamer carbonate (RENVELA) 800 MG tablet Error  . sulfamethoxazole-trimethoprim (BACTRIM DS) 800-160 MG tablet Error  .  tenofovir (VIREAD) 300 MG tablet Error  . diltiazem (CARDIZEM CD) 120 MG 24 hr capsule Reorder  . albuterol (VENTOLIN HFA) 108 (90 Base) MCG/ACT inhaler Reorder    Recommendations:   [name]  is a 60 y.o.  male  with very complex medical problems including severe COPD and chronic cor pulmonale with pulmonary hypertension, coronary artery disease s/p stenting to mid RCA in 2018, paroxysmal atrial fibrillation with RVR, hypertensive cardiomyopathy, dietary and medication noncompliance, end-stage renal disease on hemodialysis, chronic hepatitis B and C with cirrhosis of the liver and ascites, tobacco use, smokes 1/2 ppd, h/o marijuana use and prior cocaine use quit in 2017. Not on anticoagulation due to cirrhosis of liver and compliance with Warfarin.  He has also been in the hospital with acute respiratory distress and pulmonary edema due to missed dialysis.  Patient is now status post amputation of right index and right ring fingers.  Also recently hospitalized for acute metabolic encephalopathy and aacetaminophen overdose.  Patient presents for follow-up of atrial fibrillation and heart failure.  EKG today reveals patient remains in atrial fibrillation with controlled ventricular response.  He is feeling well since recent hospitalization and without clinical signs of acute heart failure.  Patient reports medication compliance, however it is difficult to discern whether he is certainly taking medications as directed.  Patient is requesting refill of albuterol and Symbicort inhalers, he states he has a  appointment with his PCP scheduled at the end of this month.  I will therefore provide him with 1 refill of each inhaler to get him through until that appointment with his primary care provider.  We will defer further management to PCP.  Discussed with patient that as he is on amiodarone recommend he undergo annual eye exam and PFTs.  We will obtain TSH this time. Patient verbalized understanding of importance of eye exam and PFTs, he plans to discuss this with his PCP at follow up at the end of the month.   Encourage patient to continue to take medications as directed and to abide by his dialysis schedule.  Advised him regarding the importance of continued compliance with both dialysis and medications.  Follow-up in 3 months, sooner if needed, for atrial fibrillation, pulmonary hypertension, heart failure, cor pulmonale.   Alethia Berthold, PA-C 10/28/2020, 1:07 PM Office: [phone]

## 2020-10-27 ENCOUNTER — Ambulatory Visit: Payer: Medicare Other | Admitting: Student

## 2020-10-27 ENCOUNTER — Ambulatory Visit (HOSPITAL_COMMUNITY): Admission: RE | Admit: 2020-10-27 | Payer: Medicare Other | Source: Ambulatory Visit

## 2020-10-28 ENCOUNTER — Other Ambulatory Visit: Payer: Self-pay

## 2020-10-28 ENCOUNTER — Encounter: Payer: Self-pay | Admitting: Student

## 2020-10-28 ENCOUNTER — Ambulatory Visit (HOSPITAL_COMMUNITY): Payer: Medicare Other

## 2020-10-28 ENCOUNTER — Ambulatory Visit (HOSPITAL_COMMUNITY)
Admission: RE | Admit: 2020-10-28 | Discharge: 2020-10-28 | Disposition: A | Discharge status: Home / Self Care | Payer: Medicare Other | Source: Ambulatory Visit | Attending: Critical Care Medicine | Admitting: Critical Care Medicine

## 2020-10-28 ENCOUNTER — Ambulatory Visit: Payer: Medicare Other | Admitting: Student

## 2020-10-28 VITALS — BP 136/58 | HR 67 | Temp 98.2°F | Resp 17 | Ht 69.0 in | Wt 158.6 lb

## 2020-10-28 DIAGNOSIS — R188 Other ascites: Secondary | ICD-10-CM | POA: Diagnosis not present

## 2020-10-28 DIAGNOSIS — Z992 Dependence on renal dialysis: Secondary | ICD-10-CM

## 2020-10-28 DIAGNOSIS — K7460 Unspecified cirrhosis of liver: Secondary | ICD-10-CM | POA: Insufficient documentation

## 2020-10-28 DIAGNOSIS — I447 Left bundle-branch block, unspecified: Secondary | ICD-10-CM

## 2020-10-28 DIAGNOSIS — I480 Paroxysmal atrial fibrillation: Secondary | ICD-10-CM

## 2020-10-28 DIAGNOSIS — I2781 Cor pulmonale (chronic): Secondary | ICD-10-CM

## 2020-10-28 DIAGNOSIS — N186 End stage renal disease: Secondary | ICD-10-CM

## 2020-10-28 DIAGNOSIS — Z5181 Encounter for therapeutic drug level monitoring: Secondary | ICD-10-CM

## 2020-10-28 HISTORY — PX: IR PARACENTESIS: IMG2679

## 2020-10-28 MED ORDER — BUDESONIDE-FORMOTEROL FUMARATE 160-4.5 MCG/ACT IN AERO: 2.0000 | INHALATION_SPRAY | Freq: Every day | RESPIRATORY_TRACT | 0 refills | Status: DC

## 2020-10-28 MED ORDER — LIDOCAINE HCL 1 % IJ SOLN
INTRAMUSCULAR | Status: AC
  Filled 2020-10-28: qty 20

## 2020-10-28 MED ORDER — DILTIAZEM HCL ER COATED BEADS 120 MG PO CP24: 120.0000 mg | ORAL_CAPSULE | Freq: Every day | ORAL | 3 refills | Status: DC

## 2020-10-28 MED ORDER — ALBUTEROL SULFATE HFA 108 (90 BASE) MCG/ACT IN AERS: 2.0000 | INHALATION_SPRAY | RESPIRATORY_TRACT | 0 refills | Status: DC | PRN

## 2020-10-30 IMAGING — US IR PARACENTESIS
1 series · 4 of 4 positions shown · non-contrast
Comparison: none

INDICATION: Patient with history of end-stage renal disease, hepatitis B/C,
alcoholic cirrhosis with recurrent ascites. Request IR for
therapeutic paracentesis.

[Series 1: ir (id) (id)/(id)/(id) ir · 4 of 4 slices shown]
[im 1/4]
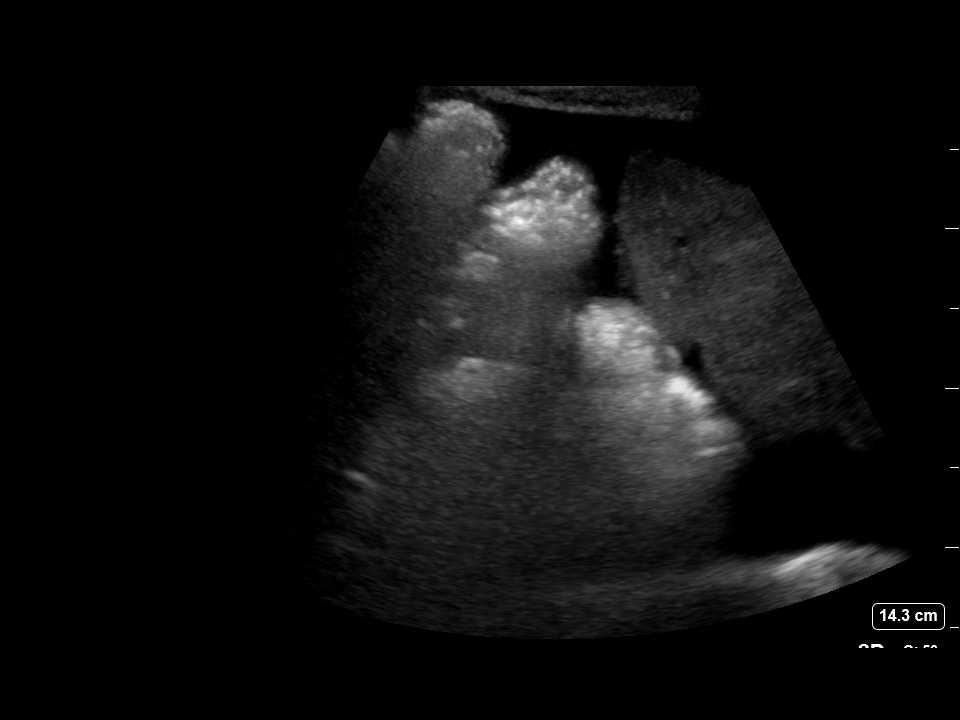
[im 2/4]
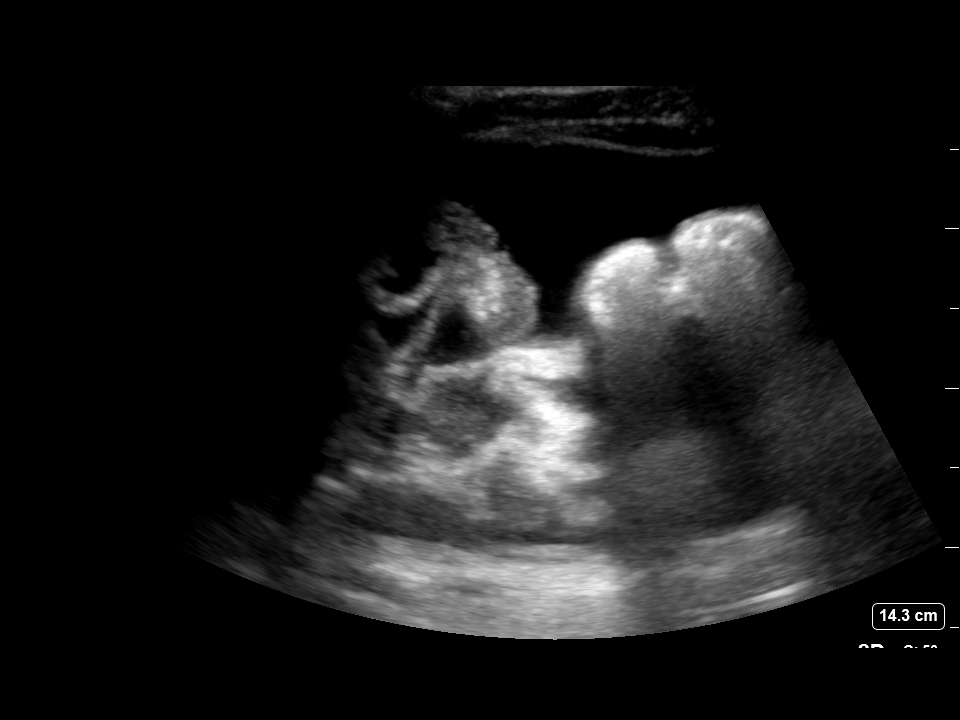
[im 3/4]
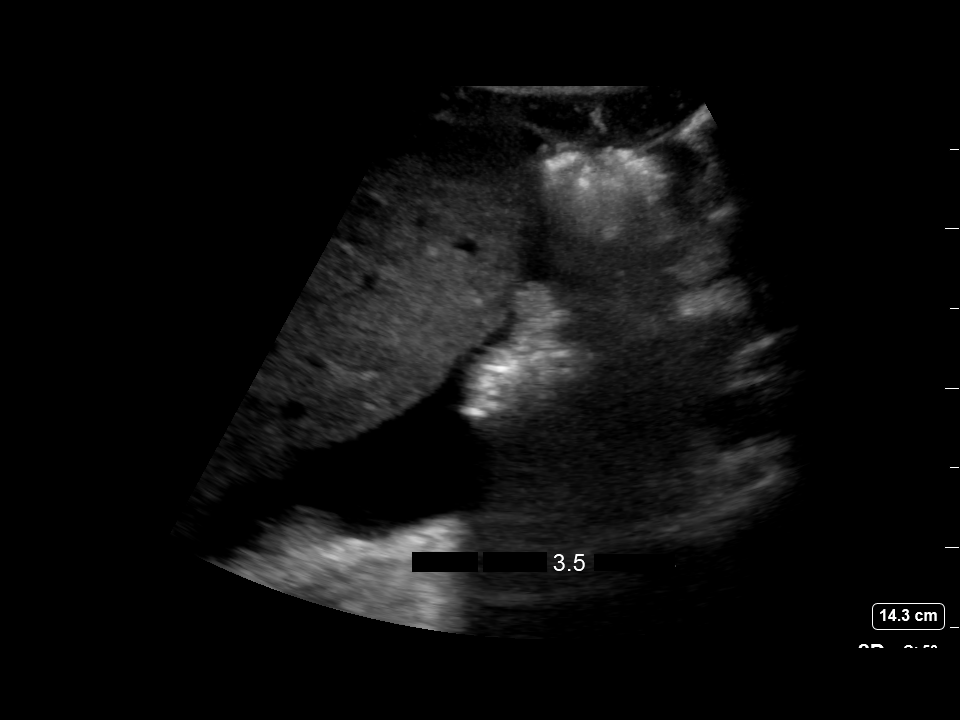
[im 4/4]
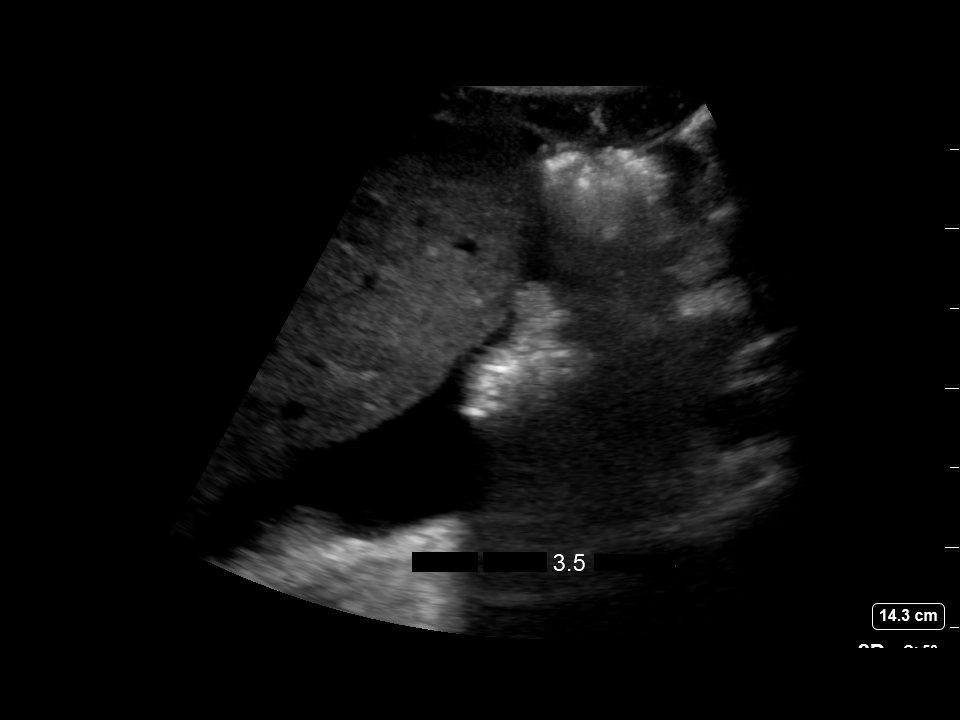

[4 of 4 positions shown; findings below may reference images not displayed]

EXAM:
ULTRASOUND GUIDED THERAPEUTIC PARACENTESIS

MEDICATIONS:
15 mL 1% lidocaine

COMPLICATIONS:
None immediate.

PROCEDURE:
Informed written consent was obtained from the patient after a
discussion of the risks, benefits and alternatives to treatment. A
timeout was performed prior to the initiation of the procedure.

Initial ultrasound scanning demonstrates a large amount of ascites
within the right lower abdominal quadrant. The right lower abdomen
was prepped and draped in the usual sterile fashion. 1% lidocaine
was used for local anesthesia.

Following this, a 19 gauge, 7-cm, Yueh catheter was introduced. An
ultrasound image was saved for documentation purposes. The
paracentesis was performed. The catheter was removed and a dressing
was applied. The patient tolerated the procedure well without
immediate post procedural complication.
FINDINGS: A total of approximately 3.5 L of hazy yellow fluid was removed.
IMPRESSION: Successful ultrasound-guided paracentesis yielding 3.5 liters of
peritoneal fluid.

Read by Tiger, Ebadat

## 2020-11-05 ENCOUNTER — Other Ambulatory Visit: Payer: Self-pay | Admitting: Orthopedic Surgery

## 2020-11-05 IMAGING — CT CT CHEST W/ CM
2 of 5 series · 12 of 36 positions shown, 15 images · IV contrast (APPLIED)
Comparison: 03/08/2019, 05/08/2017

CLINICAL DATA: Abdominal pain with nausea and diarrhea, recurrent
ascites, dialysis patient

EXAM:
CT CHEST, ABDOMEN, AND PELVIS WITH CONTRAST
TECHNIQUE: Multidetector CT imaging of the chest, abdomen and pelvis was
performed following the standard protocol during bolus
administration of intravenous contrast.
CONTRAST:  100mL OMNIPAQUE IOHEXOL 300 MG/ML  SOLN

[Series 3: cap 5.0 i31f 2 · axial · 0.77mm/px · z∈[+1018,+1564]mm · 9 of 137 slices shown, 12 images]
[im 14/137  mediastinal]
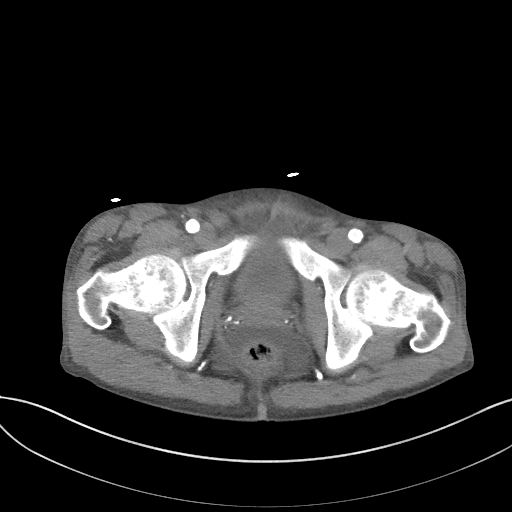
[im 14/137  lung]
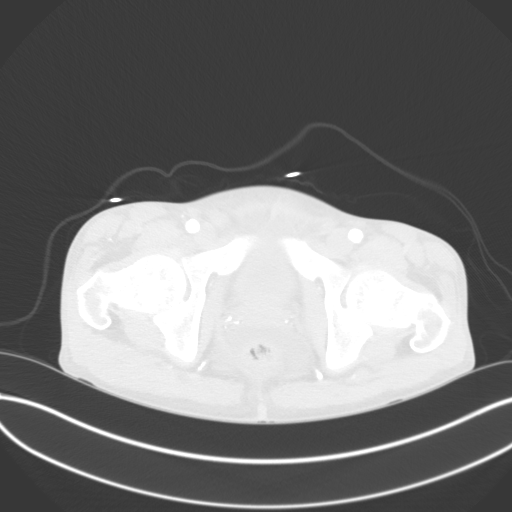
[im 28/137  lung]
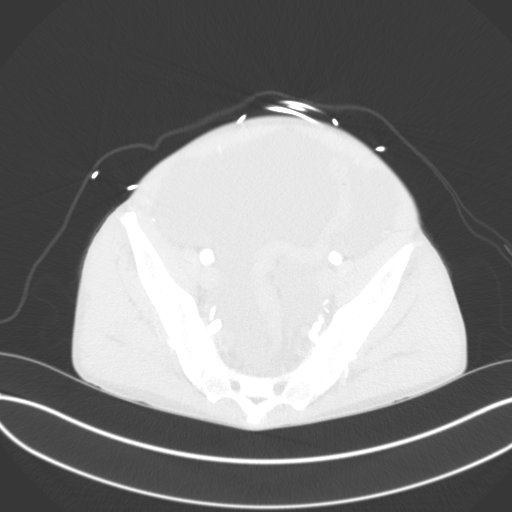
[im 41/137  lung]
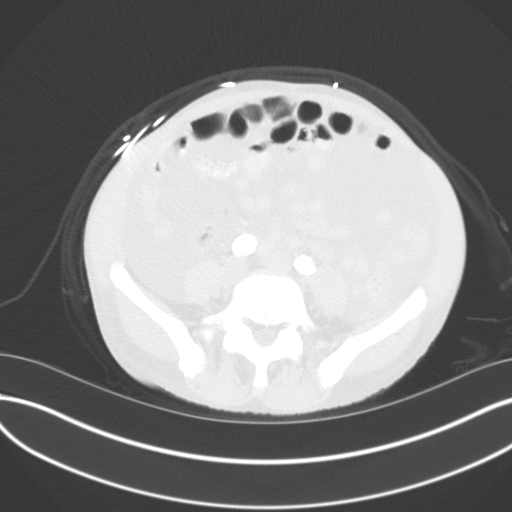
[im 55/137  lung]
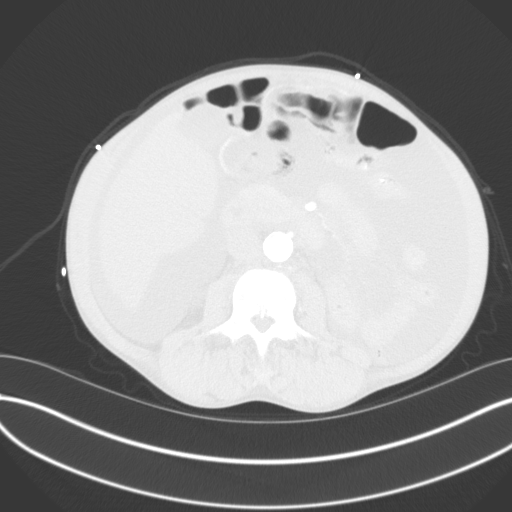
[im 69/137  mediastinal]
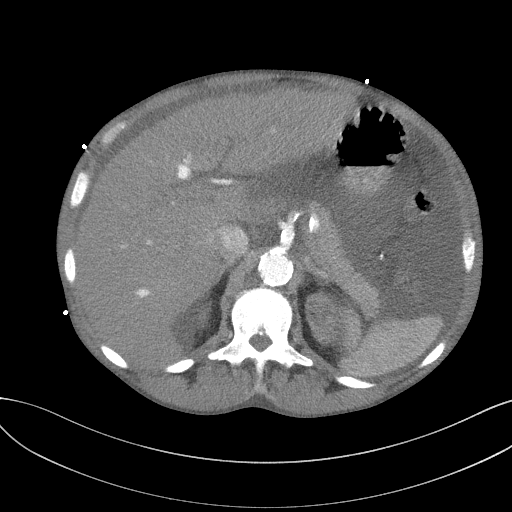
[im 69/137  lung]
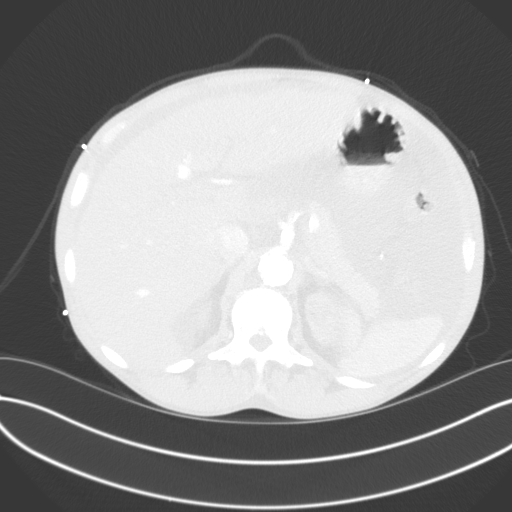
[im 82/137  lung]
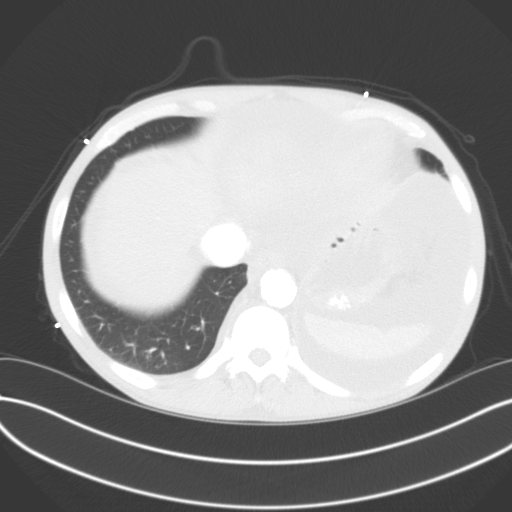
[im 96/137  lung]
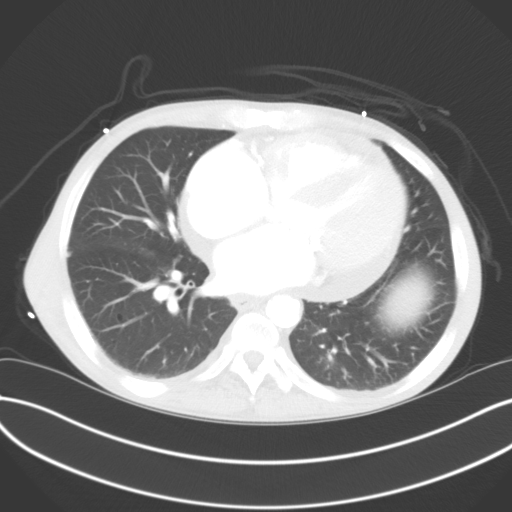
[im 109/137  lung]
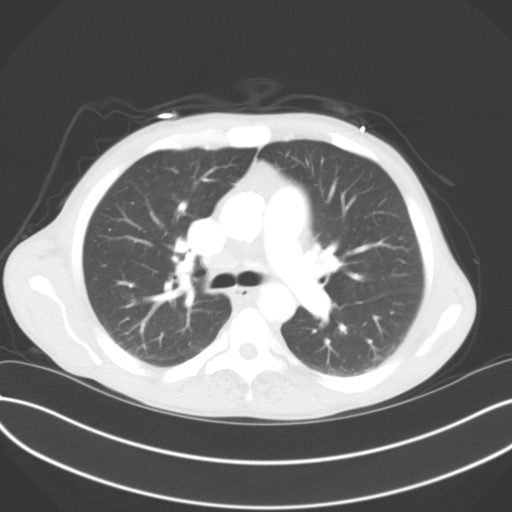
[im 123/137  mediastinal]
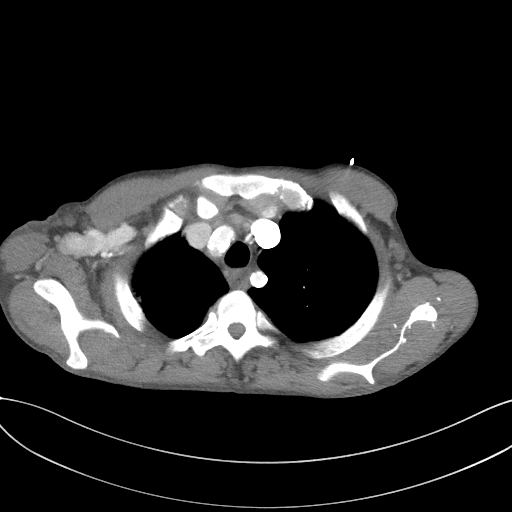
[im 123/137  lung]
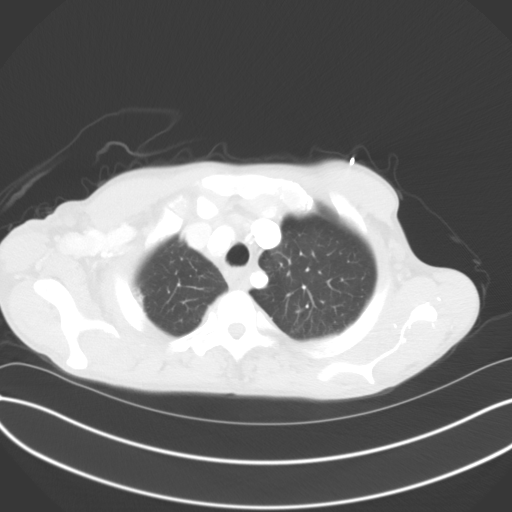

[Series 6: coronal · coronal · 0.85mm/px · 3 of 137 slices shown]
[im 28/137  lung]
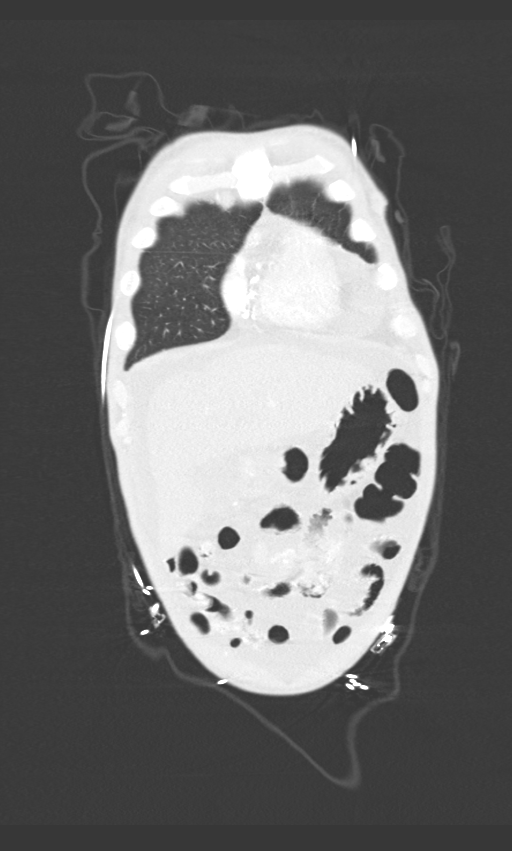
[im 55/137  lung]
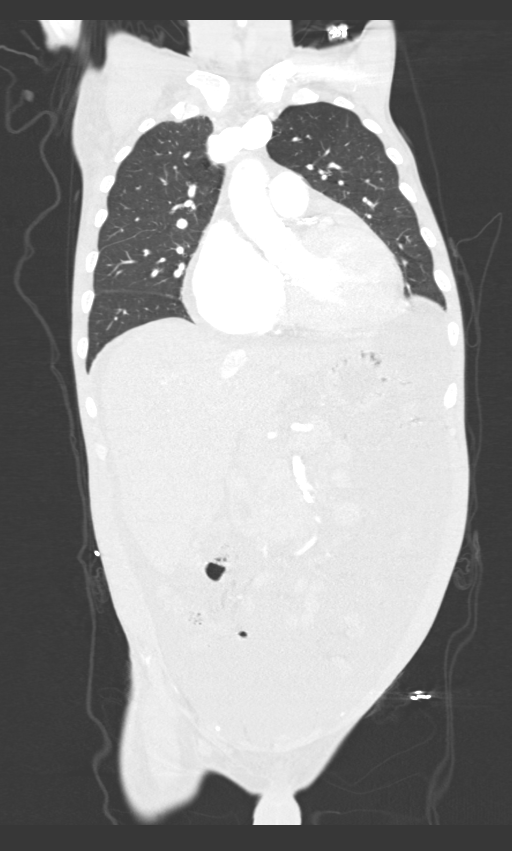
[im 82/137  lung]
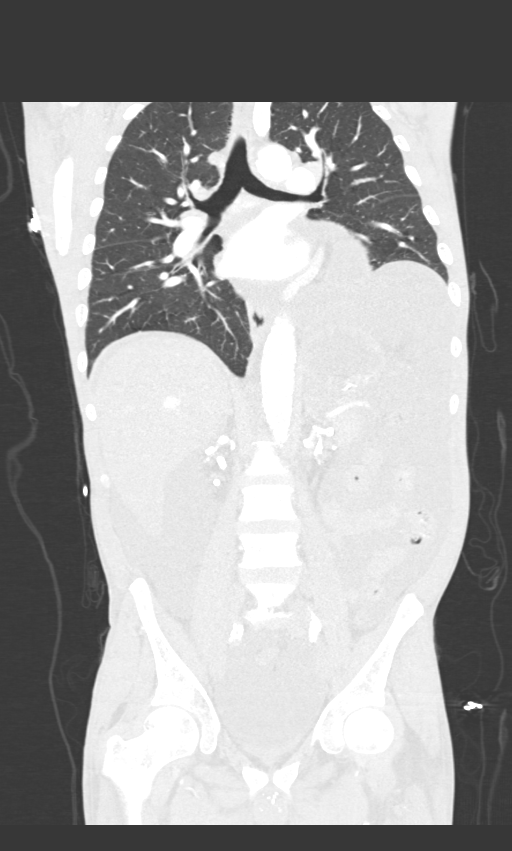

[12 of 36 positions shown; findings below may reference images not displayed]

FINDINGS: CT CHEST FINDINGS

Cardiovascular: Extensive calcific atherosclerosis of the major
branch vessels and aorta. No acute dissection, aneurysm, or
mediastinal hematoma. Central pulmonary arteries are patent. No
large central pulmonary embolus. Native coronary atherosclerosis.
Heart is enlarged. Small pericardial effusion noted. Contrast
refluxes into the hepatic IVC and hepatic veins compatible with
elevated right heart pressures or right heart failure.

Mediastinum/Nodes: Thyroid, trachea and esophagus unremarkable.

Mildly prominent left supraclavicular adenopathy, short axis
measurements 12 mm, image 6 series 3. No significant axillary
adenopathy. Right paratracheal mild adenopathy measures 1.3 cm in
short axis, image 24 series 3. Prominent subcarinal lymph nodes with
short axis measurements 1.4 cm. No hilar adenopathy appreciated.

Lungs/Pleura: Minor apical scarring and small subpleural blebs
noted. Mild upper lobe paraseptal emphysema peripherally. Minor
dependent basilar atelectasis. No focal pneumonia, collapse or
consolidation. Negative for edema or interstitial process. No
pleural abnormality, effusion, or pneumothorax.

Musculoskeletal: Chronic osseous changes of renal osteodystrophy.
Degenerative changes of the spine. Intact thoracic spine. No
compression fracture. Sternum intact.

CT ABDOMEN PELVIS FINDINGS

Hepatobiliary: Cirrhosis of the liver noted with micro nodularity of
the surface. No large focal hepatic abnormality or intrahepatic
biliary dilatation. Gallbladder biliary system unremarkable. Common
bile duct nondilated.

Pancreas: Unremarkable. No pancreatic ductal dilatation or
surrounding inflammatory changes.

Spleen: Normal in size without focal abnormality.

Adrenals/Urinary Tract: Normal adrenal glands. Chronic renal atrophy
and subcentimeter chronic cystic disease related to long-term
dialysis. Extensive renal atherosclerosis. No renal obstruction or
hydronephrosis. No hydroureter. Bladder collapsed.

Stomach/Bowel: Negative for bowel obstruction, significant
dilatation, ileus, free air. No definite focal bowel thickening.

Large volume of abdominopelvic ascites.

Vascular/Lymphatic: Extensive abdominal atherosclerosis with heavy
calcification of the abdominopelvic vasculature and aorta. No
occlusive process. No bulky adenopathy.

Reproductive: No significant finding by CT.

Other: No inguinal or abdominal wall hernia.

Musculoskeletal: Chronic changes of renal osteodystrophy.
Degenerative changes of the lumbar spine. No acute osseous finding.
IMPRESSION: No acute intrathoracic finding.

Cardiomegaly with reflux of contrast into the hepatic IVC and
hepatic veins suggesting elevated right heart pressures or right
heart failure.

Nonspecific mild mediastinal and left supraclavicular adenopathy

Mild emphysema pattern without superimposed acute airspace process

Hepatic cirrhosis and large volume of abdominopelvic ascites

Chronic renal atrophy and a chronic renal cystic disease from
long-term dialysis

Extensive atherosclerotic changes

Chronic renal osteodystrophy without acute osseous finding

## 2020-11-05 IMAGING — DX DG CHEST 1V PORT
1 series · 1 of 1 positions shown · non-contrast
Comparison: April 16, 2019.

CLINICAL DATA: Chronic renal failure.  Nausea and diarrhea

EXAM:
PORTABLE CHEST 1 VIEW

[chest ap]
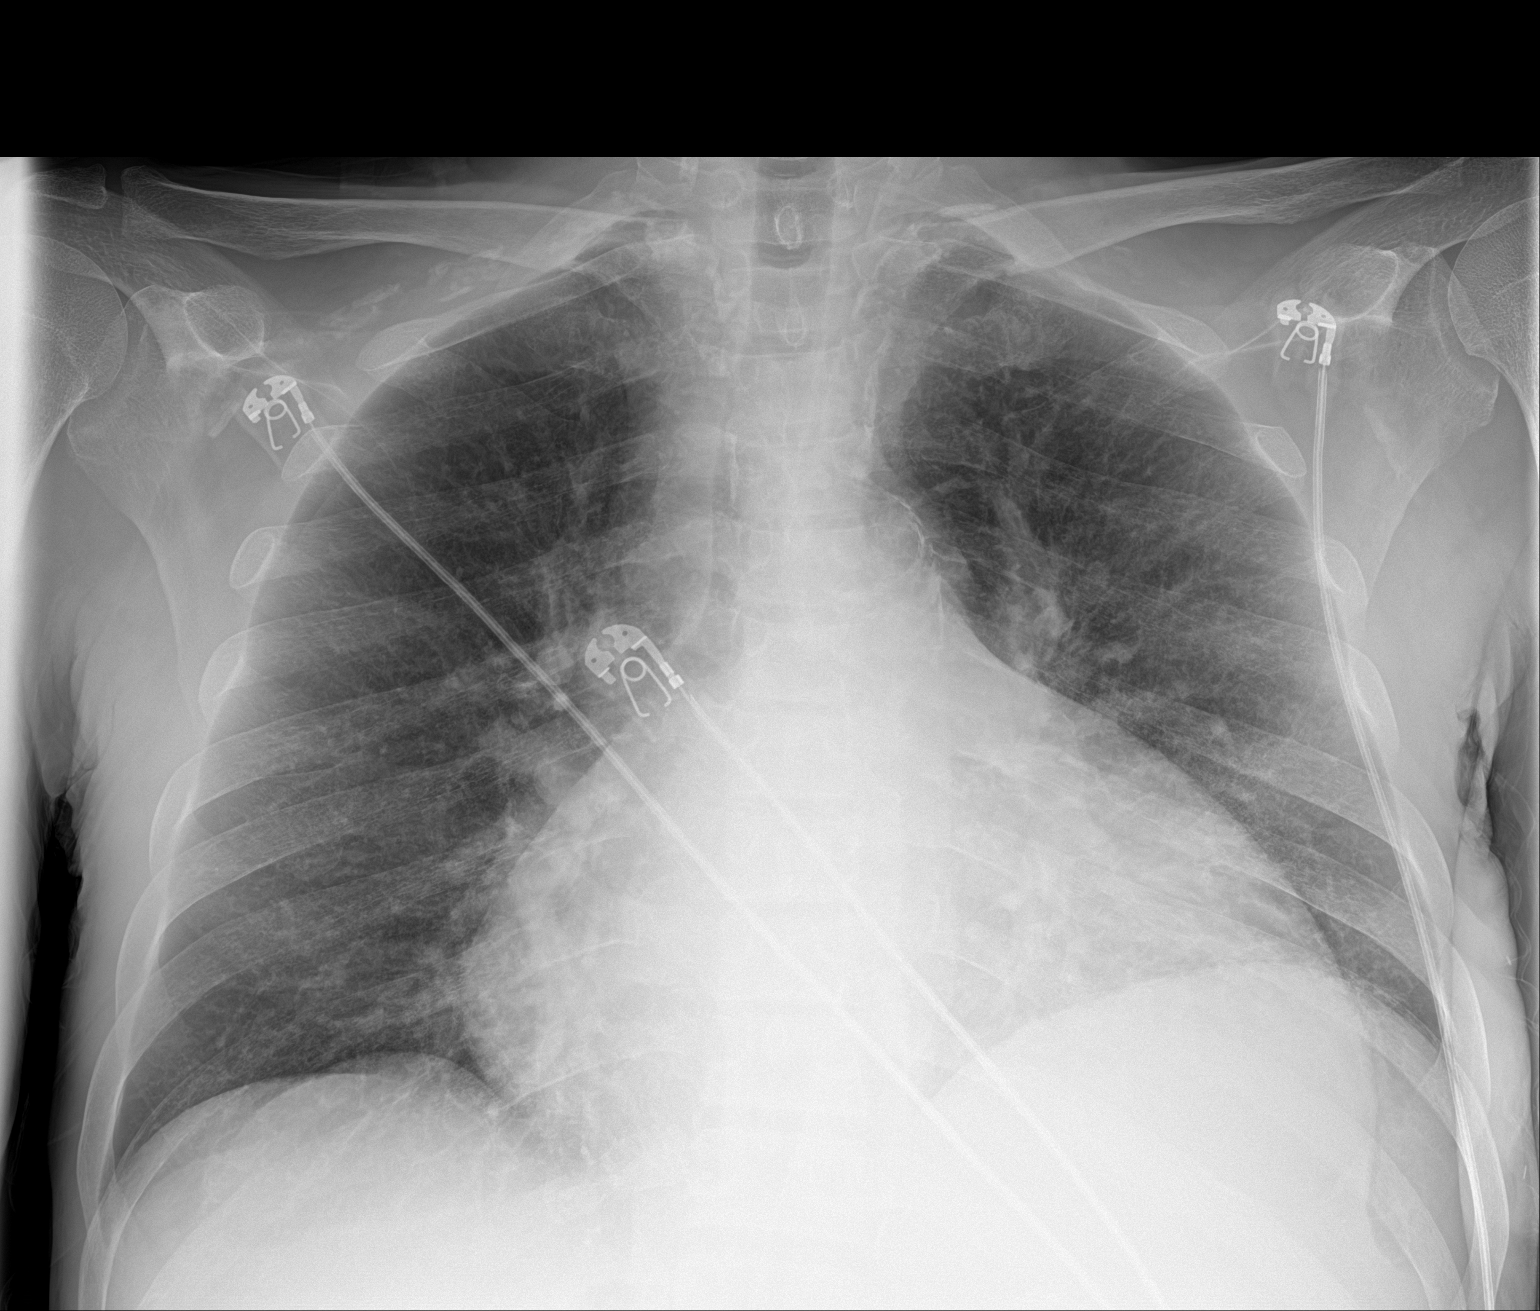

[1 of 1 positions shown; findings below may reference images not displayed]

FINDINGS: There is slight atelectasis in the left base. The lungs elsewhere
are clear. There is generalized cardiac enlargement with pulmonary
vascularity normal. No adenopathy. There is aortic atherosclerosis.
There is extensive carotid artery calcification bilaterally. There
is also extensive right axillary artery and right subclavian artery
calcification. No evident bone lesions.
IMPRESSION: Slight left base atelectasis. Cardiac enlargement. There may be
pericardial effusion superimposed on cardiomegaly given the
configuration of the heart.

Extensive multifocal arterial vascular calcification. No adenopathy.

Aortic Atherosclerosis ([id number]).

## 2020-11-05 IMAGING — CT CT ABD-PELV W/ CM
2 of 5 series · 12 of 36 positions shown, 15 images · IV contrast (APPLIED)
Comparison: 03/08/2019, 05/08/2017

CLINICAL DATA: Abdominal pain with nausea and diarrhea, recurrent
ascites, dialysis patient

EXAM:
CT CHEST, ABDOMEN, AND PELVIS WITH CONTRAST
TECHNIQUE: Multidetector CT imaging of the chest, abdomen and pelvis was
performed following the standard protocol during bolus
administration of intravenous contrast.
CONTRAST:  100mL OMNIPAQUE IOHEXOL 300 MG/ML  SOLN

[Series 1: cap 5.0 i31f 2 · axial · 0.77mm/px · z∈[+1018,+1564]mm · 9 of 137 slices shown, 12 images]
[im 14/137  mediastinal]
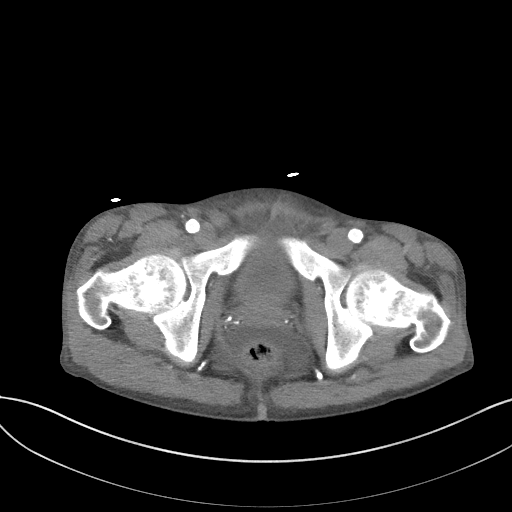
[im 14/137  lung]
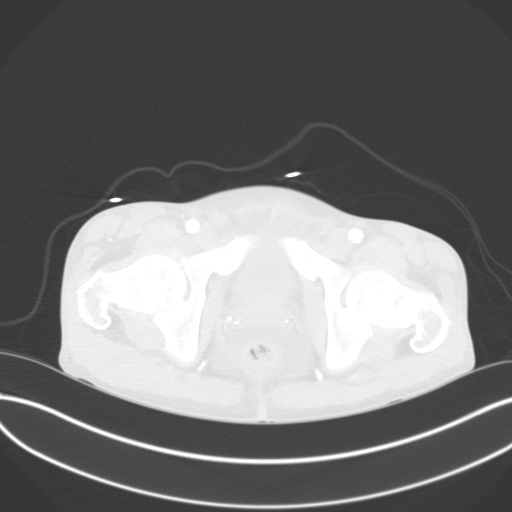
[im 28/137  lung]
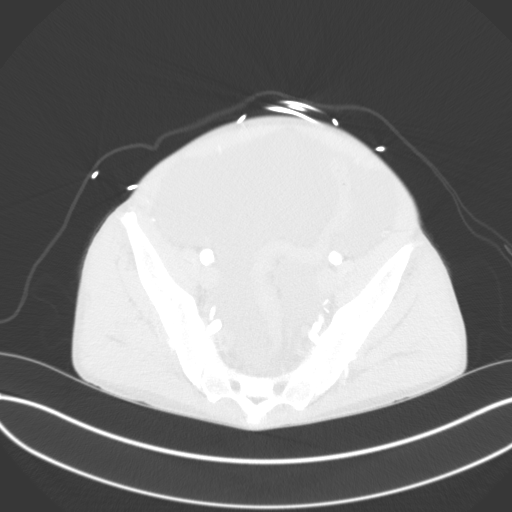
[im 41/137  lung]
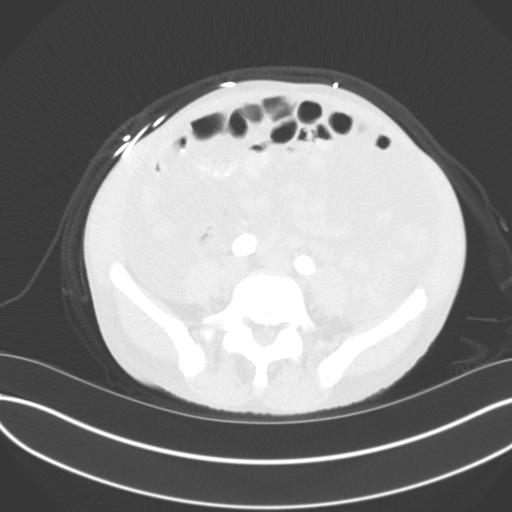
[im 55/137  lung]
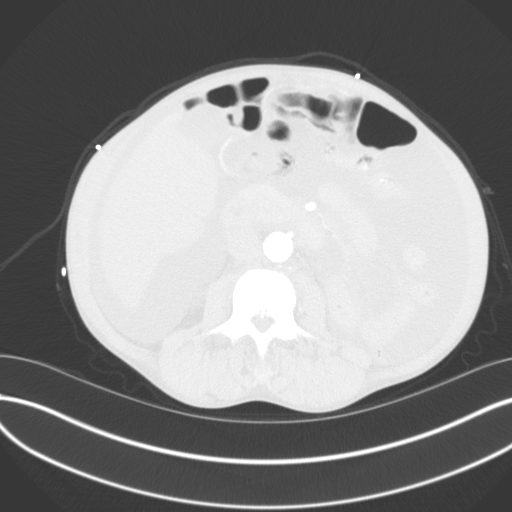
[im 69/137  mediastinal]
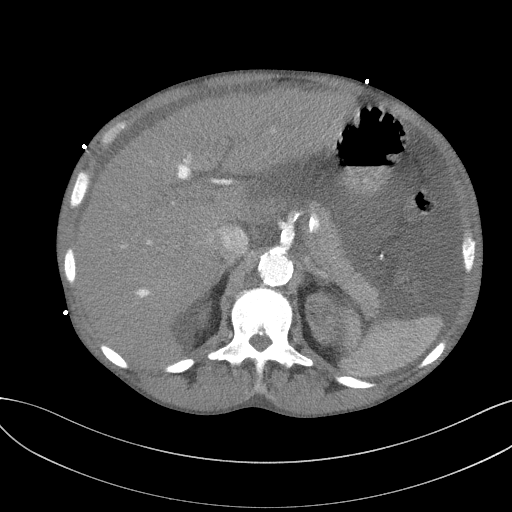
[im 69/137  lung]
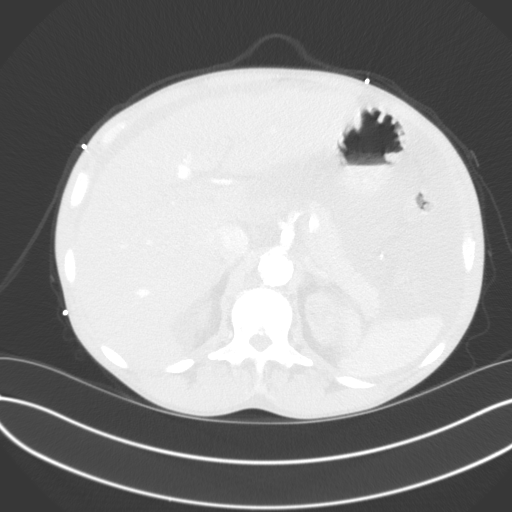
[im 82/137  lung]
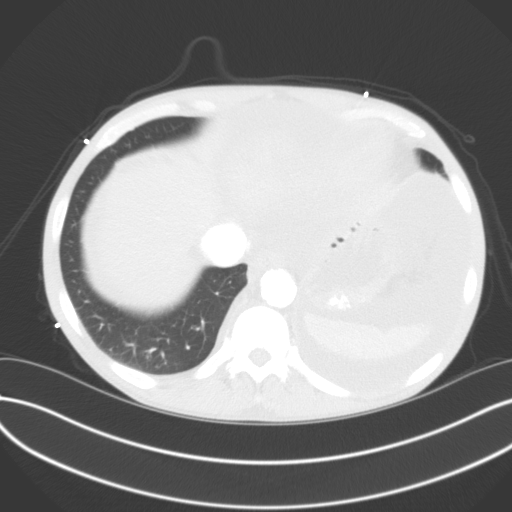
[im 96/137  lung]
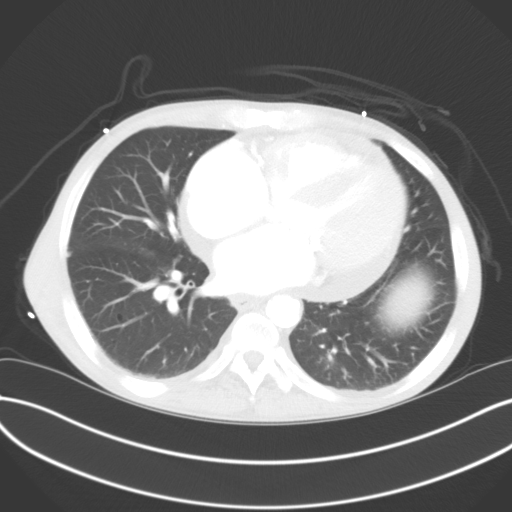
[im 109/137  lung]
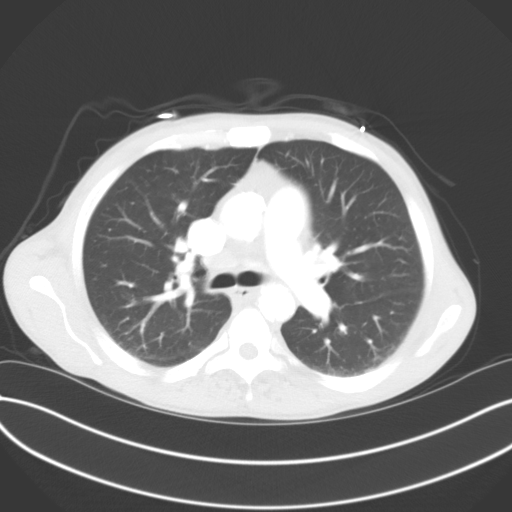
[im 123/137  mediastinal]
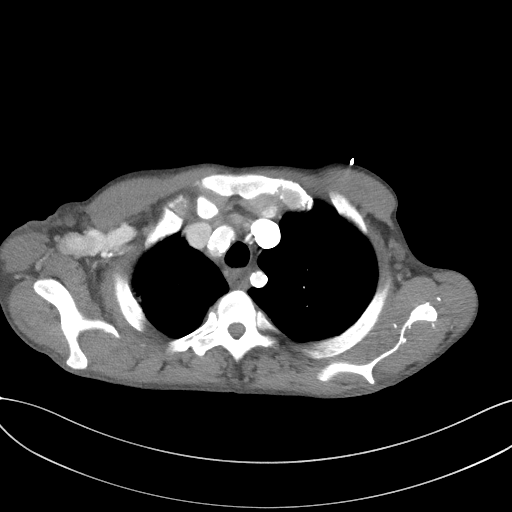
[im 123/137  lung]
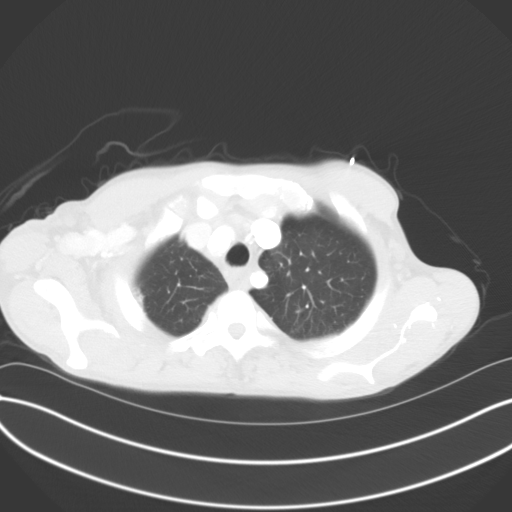

[Series 5: coronal · coronal · 0.85mm/px · 3 of 137 slices shown]
[im 28/137  lung]
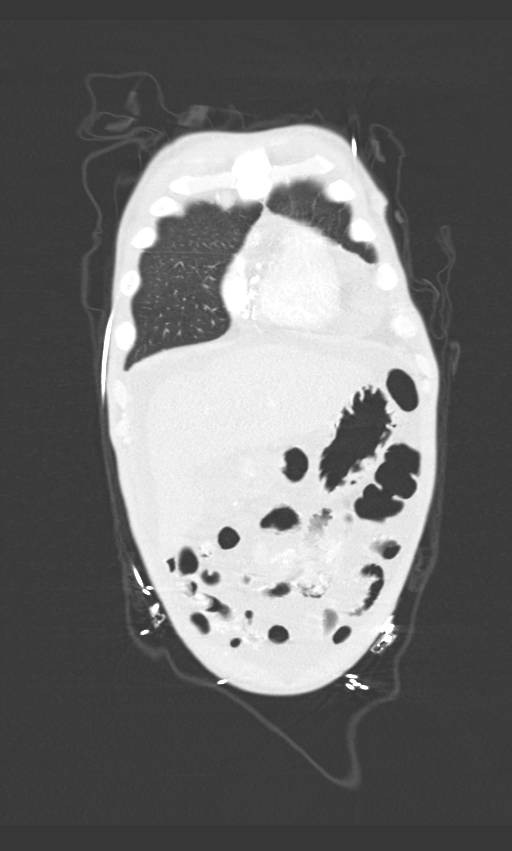
[im 55/137  lung]
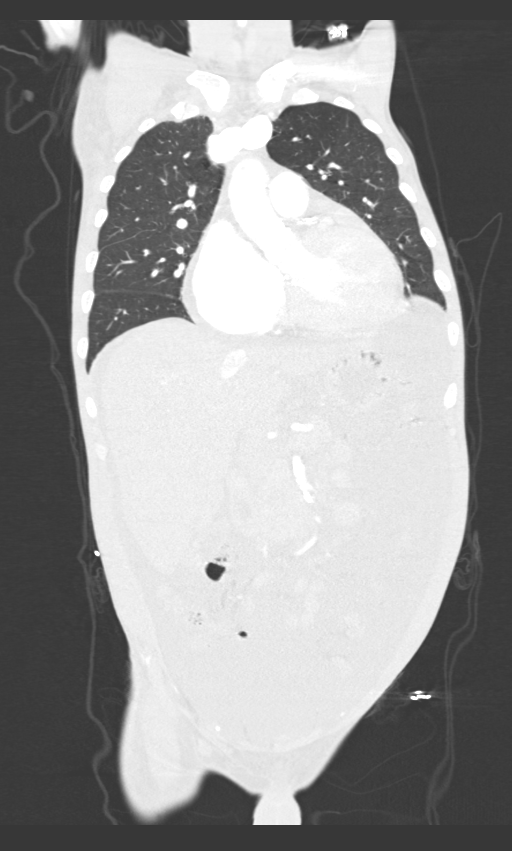
[im 82/137  lung]
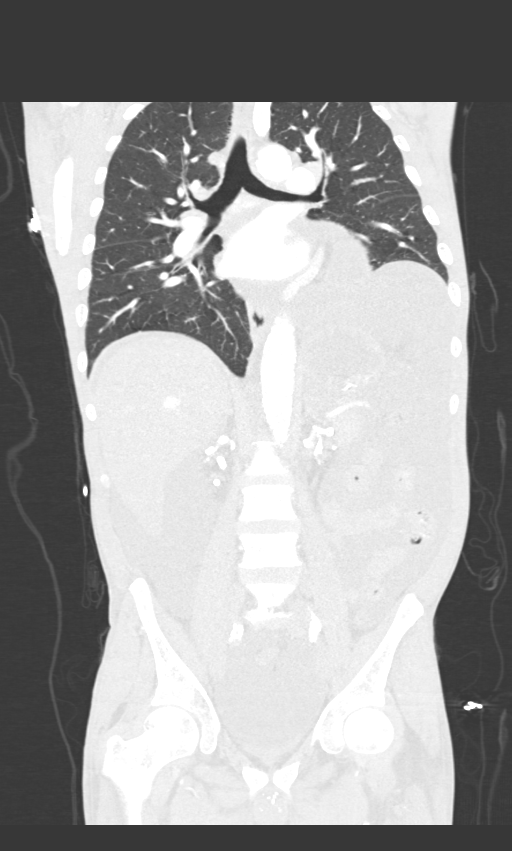

[12 of 36 positions shown; findings below may reference images not displayed]

FINDINGS: CT CHEST FINDINGS

Cardiovascular: Extensive calcific atherosclerosis of the major
branch vessels and aorta. No acute dissection, aneurysm, or
mediastinal hematoma. Central pulmonary arteries are patent. No
large central pulmonary embolus. Native coronary atherosclerosis.
Heart is enlarged. Small pericardial effusion noted. Contrast
refluxes into the hepatic IVC and hepatic veins compatible with
elevated right heart pressures or right heart failure.

Mediastinum/Nodes: Thyroid, trachea and esophagus unremarkable.

Mildly prominent left supraclavicular adenopathy, short axis
measurements 12 mm, image 6 series 3. No significant axillary
adenopathy. Right paratracheal mild adenopathy measures 1.3 cm in
short axis, image 24 series 3. Prominent subcarinal lymph nodes with
short axis measurements 1.4 cm. No hilar adenopathy appreciated.

Lungs/Pleura: Minor apical scarring and small subpleural blebs
noted. Mild upper lobe paraseptal emphysema peripherally. Minor
dependent basilar atelectasis. No focal pneumonia, collapse or
consolidation. Negative for edema or interstitial process. No
pleural abnormality, effusion, or pneumothorax.

Musculoskeletal: Chronic osseous changes of renal osteodystrophy.
Degenerative changes of the spine. Intact thoracic spine. No
compression fracture. Sternum intact.

CT ABDOMEN PELVIS FINDINGS

Hepatobiliary: Cirrhosis of the liver noted with micro nodularity of
the surface. No large focal hepatic abnormality or intrahepatic
biliary dilatation. Gallbladder biliary system unremarkable. Common
bile duct nondilated.

Pancreas: Unremarkable. No pancreatic ductal dilatation or
surrounding inflammatory changes.

Spleen: Normal in size without focal abnormality.

Adrenals/Urinary Tract: Normal adrenal glands. Chronic renal atrophy
and subcentimeter chronic cystic disease related to long-term
dialysis. Extensive renal atherosclerosis. No renal obstruction or
hydronephrosis. No hydroureter. Bladder collapsed.

Stomach/Bowel: Negative for bowel obstruction, significant
dilatation, ileus, free air. No definite focal bowel thickening.

Large volume of abdominopelvic ascites.

Vascular/Lymphatic: Extensive abdominal atherosclerosis with heavy
calcification of the abdominopelvic vasculature and aorta. No
occlusive process. No bulky adenopathy.

Reproductive: No significant finding by CT.

Other: No inguinal or abdominal wall hernia.

Musculoskeletal: Chronic changes of renal osteodystrophy.
Degenerative changes of the lumbar spine. No acute osseous finding.
IMPRESSION: No acute intrathoracic finding.

Cardiomegaly with reflux of contrast into the hepatic IVC and
hepatic veins suggesting elevated right heart pressures or right
heart failure.

Nonspecific mild mediastinal and left supraclavicular adenopathy

Mild emphysema pattern without superimposed acute airspace process

Hepatic cirrhosis and large volume of abdominopelvic ascites

Chronic renal atrophy and a chronic renal cystic disease from
long-term dialysis

Extensive atherosclerotic changes

Chronic renal osteodystrophy without acute osseous finding

## 2020-11-06 ENCOUNTER — Encounter: Payer: Self-pay | Admitting: Student

## 2020-11-06 IMAGING — US IR PARACENTESIS
1 series · 3 of 3 positions shown · non-contrast
Comparison: none

INDICATION: End-stage renal disease, hepatitis B/C, alcoholic cirrhosis with
recurrent ascites. Request for diagnostic and therapeutic
paracentesis.

[Series 1: ir (id) (id)/(id)/(id) ir · 3 of 3 slices shown]
[im 1/3]
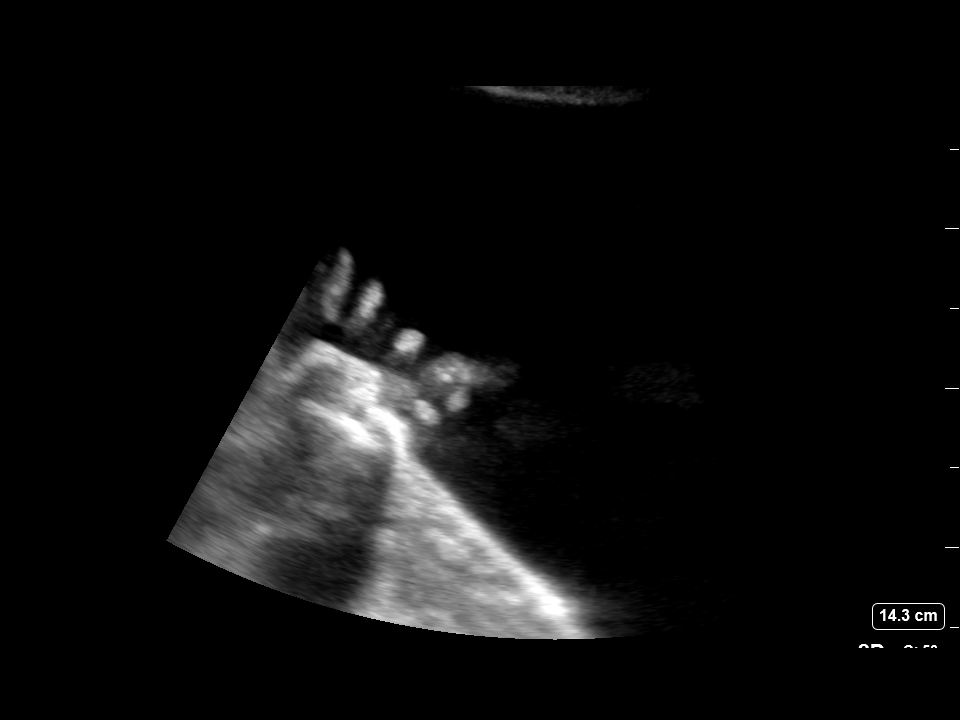
[im 2/3]
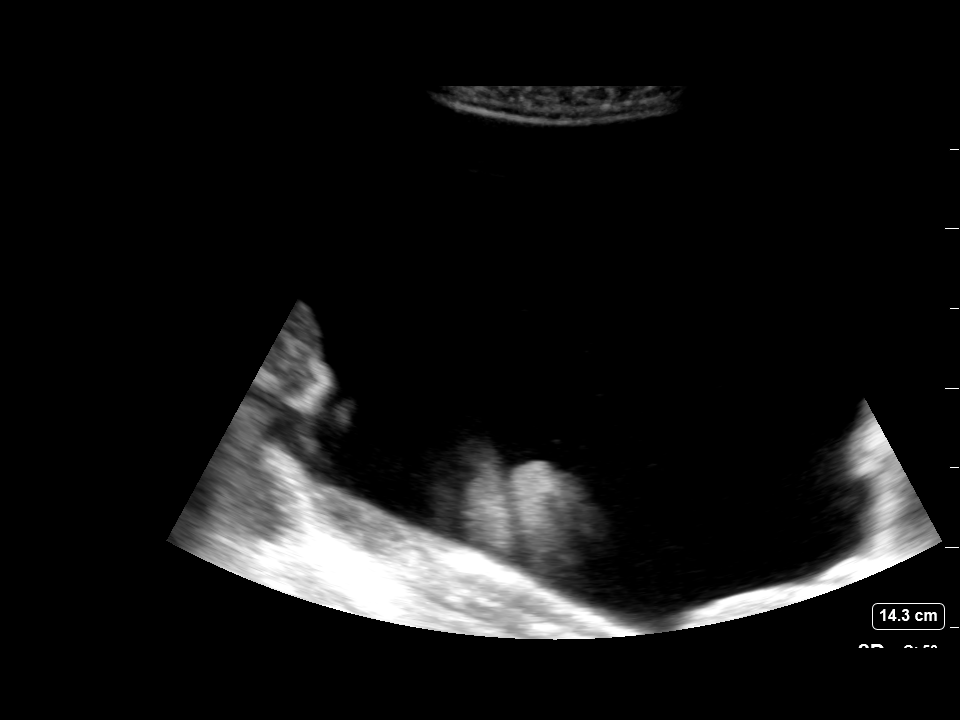
[im 3/3]
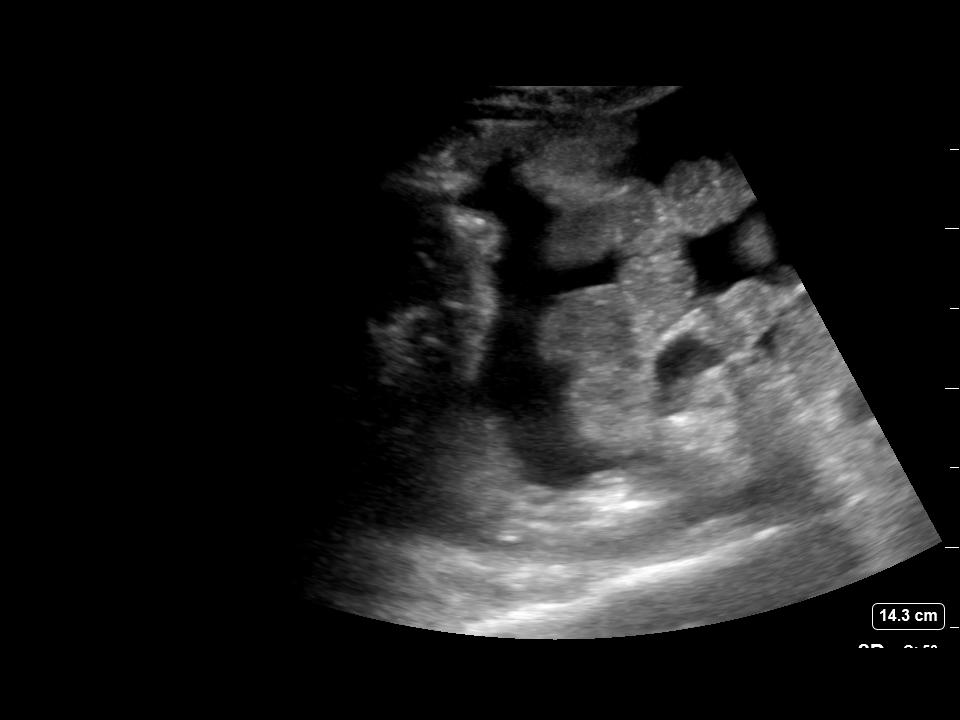

[3 of 3 positions shown; findings below may reference images not displayed]

EXAM:
ULTRASOUND GUIDED PARACENTESIS

MEDICATIONS:
1% lidocaine 10 mL

COMPLICATIONS:
None immediate.

PROCEDURE:
Informed written consent was obtained from the patient after a
discussion of the risks, benefits and alternatives to treatment. A
timeout was performed prior to the initiation of the procedure.

Initial ultrasound scanning demonstrates a small amount of ascites
within the left lateral abdomen. The left lateral abdomen was
prepped and draped in the usual sterile fashion. 1% lidocaine was
used for local anesthesia.

Following this, a 19 gauge, 7-cm, Yueh catheter was introduced. An
ultrasound image was saved for documentation purposes. The
paracentesis was performed. The catheter was removed and a dressing
was applied. The patient tolerated the procedure well without
immediate post procedural complication.
FINDINGS: A total of approximately 2 L of clear yellow fluid was removed.
Samples were sent to the laboratory as requested by the clinical
team.
IMPRESSION: Successful ultrasound-guided paracentesis yielding 2 liters of
peritoneal fluid.

## 2020-11-11 ENCOUNTER — Ambulatory Visit: Payer: Self-pay | Admitting: *Deleted

## 2020-11-11 ENCOUNTER — Other Ambulatory Visit (HOSPITAL_COMMUNITY): Payer: Medicare Other

## 2020-11-11 ENCOUNTER — Telehealth: Payer: Self-pay | Admitting: Family Medicine

## 2020-11-11 IMAGING — DX DG CHEST 1V PORT
1 series · 1 of 1 positions shown · non-contrast
Comparison: Chest radiograph and abdomen and pelvis CT dated
05/07/2019.

CLINICAL DATA: Shortness of breath. Increasing fluid in the abdomen
per patient with 3 L of fluid removed last week.

EXAM:
PORTABLE CHEST 1 VIEW

[chest]
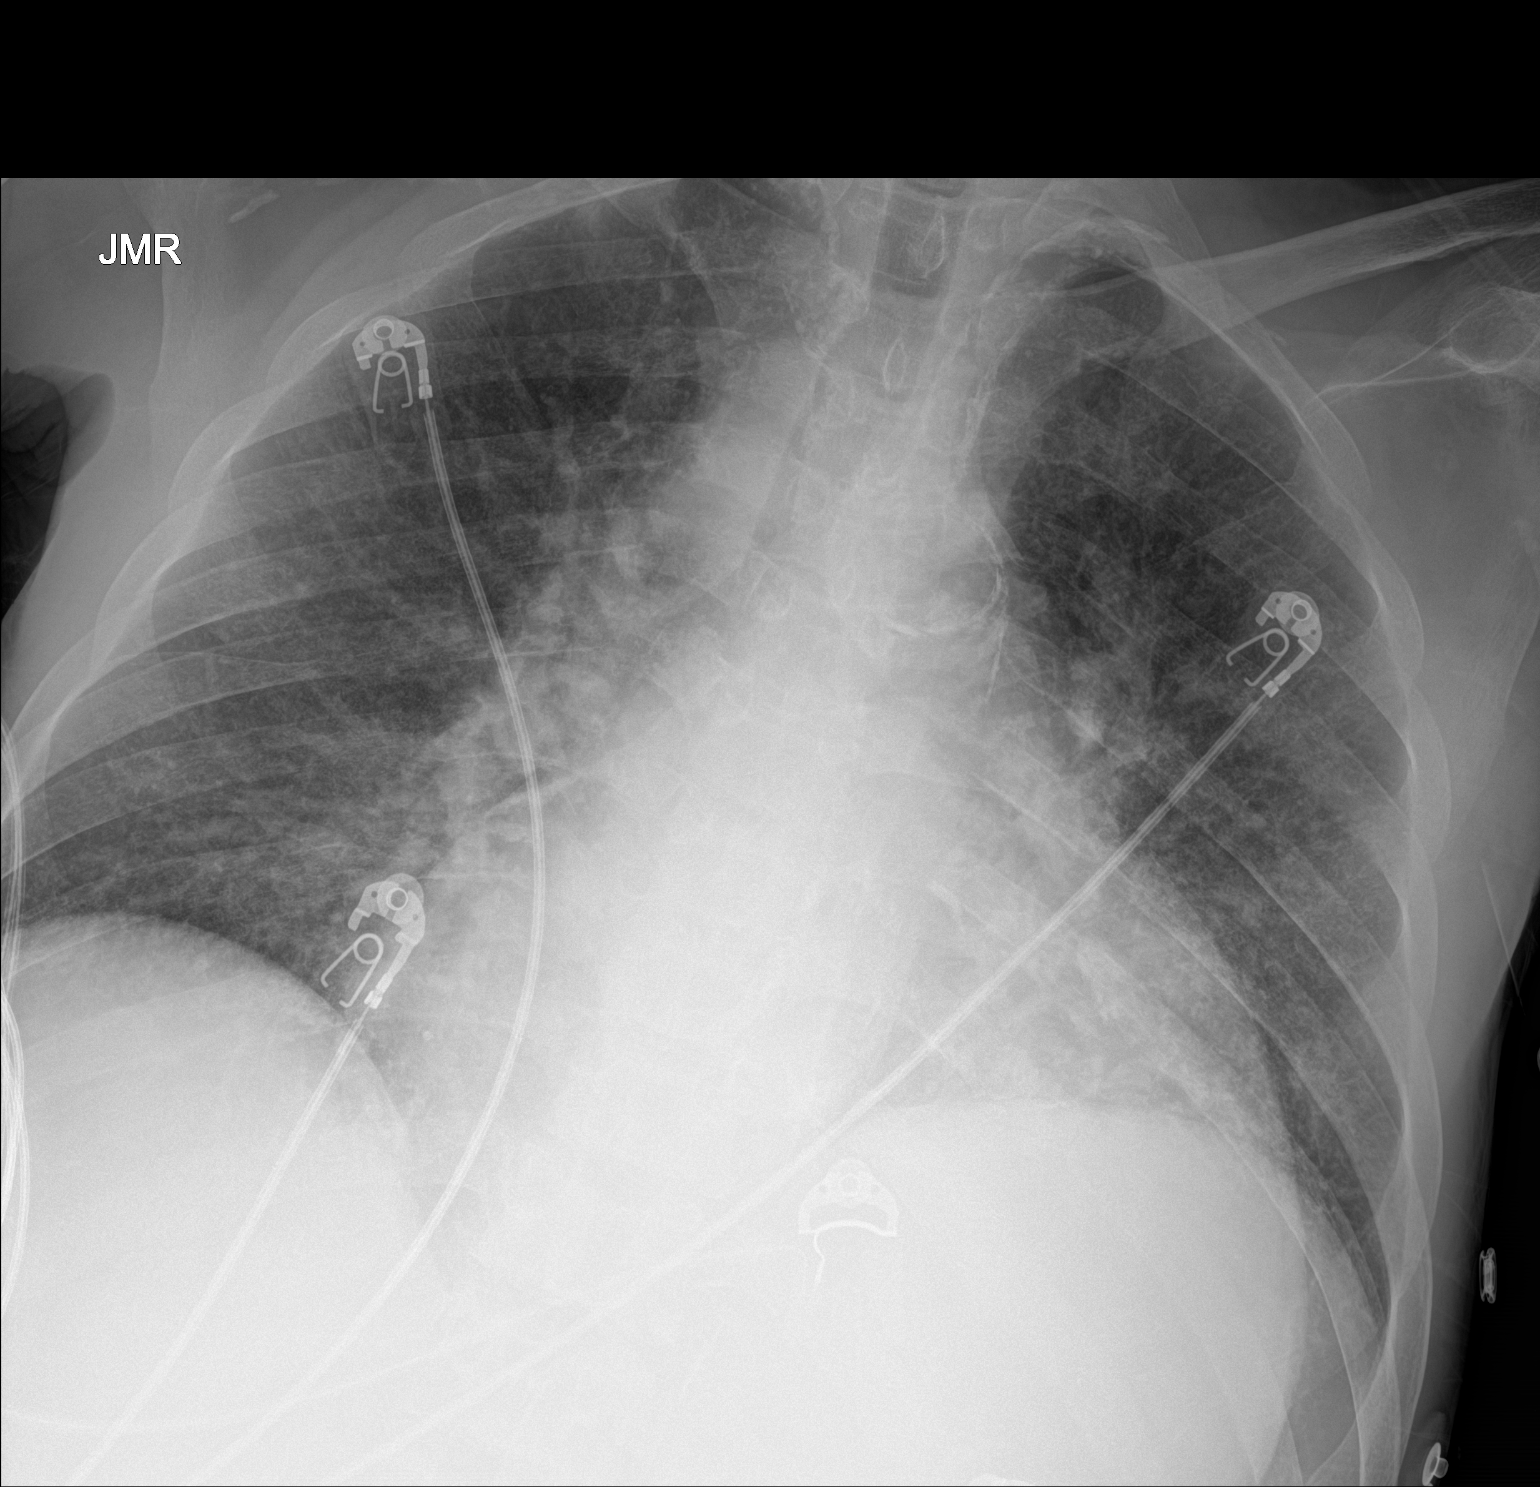

[1 of 1 positions shown; findings below may reference images not displayed]

FINDINGS: Stable markedly enlarged cardiac silhouette. Mild increase in
diffuse prominence of the interstitial markings. No visible pleural
fluid. The right lateral costophrenic angle and inferior left
lateral costophrenic angle are not included. Extensive atheromatous
arterial calcifications. Unremarkable bones.
IMPRESSION: 1. Stable marked cardiomegaly and pericardial effusion.
2. Mild changes of congestive heart failure superimposed on chronic
interstitial lung disease.
3. Extensive atheromatous arterial calcifications.

## 2020-11-11 NOTE — Telephone Encounter (Signed)
Pt called and stated he is having a reaction to his eye sight when taking his heart medication and BP meds/ he doesn't feel comfortable driving / pt spoke with NT/ please advise

## 2020-11-11 NOTE — Telephone Encounter (Signed)
Pt reports "Someone told me taking Cardizem and metoprolol together can make my vision blurry."  Pt evasive historian. Not receptive to triage questions. States onset "For days, I dont know how many."  Cannot recall who told him that, states maybe pharmacist. Last took this AM.Does not check BP, does not have any values. Reluctant  to further triage.  Advised NT would send to practice for PCPs review. Pt verbalizes understanding.  Reason for Disposition . [1] Caller has NON-URGENT medicine question about med that PCP prescribed AND [2] triager unable to answer question  Answer Assessment - Initial Assessment Questions 1. NAME of MEDICATION: "What medicine are you calling about?"     Cardizem and metoprolol  2. QUESTION: "What is your question?" (e.g., medication refill, side effect)    Causing blurry vision 3. PRESCRIBING HCP: "Who prescribed it?" Reason: if prescribed by specialist, call should be referred to that group.     4. SYMPTOMS: "Do you have any symptoms?"    Blurred vision 5. SEVERITY: If symptoms are present, ask "Are they mild, moderate or severe?"    [place]  Protocols used: MEDICATION QUESTION CALL-A-AH

## 2020-11-12 ENCOUNTER — Other Ambulatory Visit (HOSPITAL_COMMUNITY)
Admission: RE | Admit: 2020-11-12 | Discharge: 2020-11-12 | Disposition: A | Discharge status: Home / Self Care | Payer: Medicare Other | Source: Ambulatory Visit | Attending: Orthopedic Surgery | Admitting: Orthopedic Surgery

## 2020-11-12 ENCOUNTER — Encounter (HOSPITAL_COMMUNITY): Payer: Self-pay | Admitting: Orthopedic Surgery

## 2020-11-12 DIAGNOSIS — Z20822 Contact with and (suspected) exposure to covid-19: Secondary | ICD-10-CM | POA: Insufficient documentation

## 2020-11-12 DIAGNOSIS — Z01812 Encounter for preprocedural laboratory examination: Secondary | ICD-10-CM | POA: Insufficient documentation

## 2020-11-12 LAB — SARS CORONAVIRUS 2 (TAT 6-24 HRS): SARS Coronavirus 2: NEGATIVE

## 2020-11-12 IMAGING — DX DG CHEST 1V PORT
1 series · 1 of 1 positions shown · non-contrast
Comparison: May 13, 2019

CLINICAL DATA: Shortness of breath

EXAM:
PORTABLE CHEST 1 VIEW

[chest]
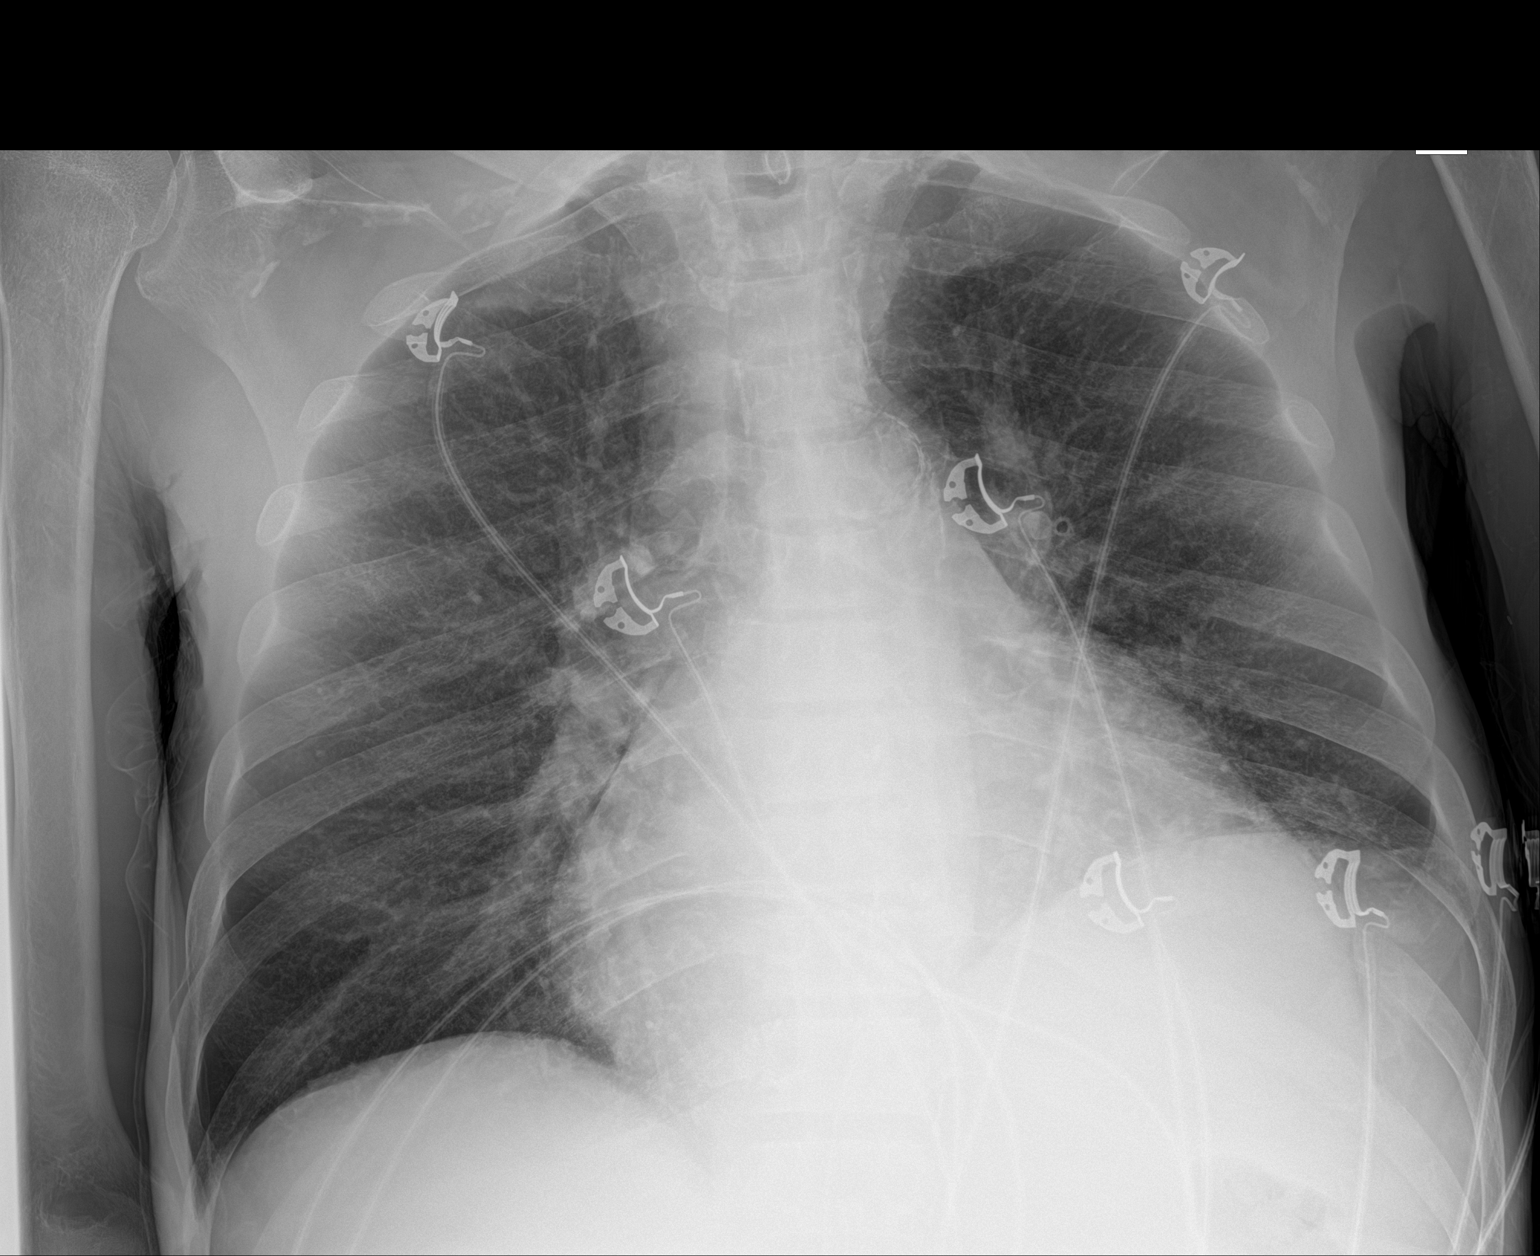

[1 of 1 positions shown; findings below may reference images not displayed]

FINDINGS: The heart size is enlarged. There is vascular congestion without
overt pulmonary edema. There is elevation of the left hemidiaphragm.
Thoracic aortic calcifications are noted. There is no pneumothorax.
IMPRESSION: Cardiomegaly with vascular congestion.

## 2020-11-12 NOTE — Progress Notes (Signed)
PCP - Dr Charlott Rakes Cardiologist - Dr Hall Busing - Alonza Bogus, PA-C  Chest x-ray - 09/20/20 (1V) EKG - 10/28/20 Stress Test - n/a ECHO - 02/05/20 Cardiac Cath - 02/22/17  ERAS: Clear liquids til 8 am on DOS.  Anesthesia review: Yes.   Cardiac Clearance on Chart.  STOP now taking any Aspirin (unless otherwise instructed by your surgeon), Aleve, Naproxen, Ibuprofen, Motrin, Advil, Goody's, BC's, all herbal medications, fish oil, and all vitamins.   Coronavirus Screening Covid test on 11/12/20 is pending results. Do you have any of the following symptoms:  Cough yes/no: No Fever (>100.72F)  yes/no: No Runny nose yes/no: No Sore throat yes/no: No Difficulty breathing/shortness of breath  yes/no: No  Have you traveled in the last 14 days and where? yes/no: No  Patient verbalized understanding of instructions that were given via phone.

## 2020-11-12 NOTE — Anesthesia Preprocedure Evaluation (Addendum)
Anesthesia Evaluation  Patient identified by MRN, date of birth, ID band Patient awake    Reviewed: Allergy & Precautions, NPO status , Patient's Chart, lab work & pertinent test results, reviewed documented beta blocker date and time   History of Anesthesia Complications Negative for: history of anesthetic complications  Airway Mallampati: II  TM Distance: >3 FB Neck ROM: Full    Dental  (+) Dental Advisory Given   Pulmonary COPD,  COPD inhaler, Current Smoker and Patient abstained from smoking.,     + decreased breath sounds      Cardiovascular hypertension, Pt. on home beta blockers and Pt. on medications + CAD, + Past MI and + Cardiac Stents  + dysrhythmias Atrial Fibrillation  Rhythm:Irregular Rate:Normal   '21 TTE - EF 50 to 55%. Severe left ventricular hypertrophy. Possible septal hypokinesis. Septal motion is consistent with LBBB. Right ventricular systolic function is mildly reduced. RV is moderately enlarged. Severely increased right ventricular wall thickness. There is moderately elevated pulmonary artery systolic pressure. The estimated right ventricular systolic pressure is 15.1 mmHg. Left atrial size was severely dilated. Right atrial size was severely dilated. Mild mitral valve regurgitation. Tricuspid valve regurgitation is moderate to severe. AV sclerosis without stenosis. Trivial AI.    Neuro/Psych PSYCHIATRIC DISORDERS Anxiety negative neurological ROS     GI/Hepatic PUD, GERD  Medicated,(+) Cirrhosis   ascites  substance abuse  cocaine use and marijuana use, Hepatitis -, C, B  Endo/Other  negative endocrine ROS  Renal/GU ESRF and DialysisRenal disease     Musculoskeletal  (+) Arthritis , narcotic dependent  Abdominal   Peds  Hematology negative hematology ROS (+)   Anesthesia Other Findings Covid test negative See PAT note  Reproductive/Obstetrics                            Anesthesia Physical Anesthesia Plan  ASA: IV  Anesthesia Plan: General   Post-op Pain Management:    Induction: Intravenous  PONV Risk Score and Plan: 2 and Treatment may vary due to age or medical condition, Ondansetron, Dexamethasone and Midazolam  Airway Management Planned: LMA  Additional Equipment: None  Intra-op Plan:   Post-operative Plan: Extubation in OR  Informed Consent: I have reviewed the patients History and Physical, chart, labs and discussed the procedure including the risks, benefits and alternatives for the proposed anesthesia with the patient or authorized representative who has indicated his/her understanding and acceptance.     Dental advisory given  Plan Discussed with: CRNA and Anesthesiologist  Anesthesia Plan Comments:       Anesthesia Quick Evaluation

## 2020-11-12 NOTE — Telephone Encounter (Signed)
VM was left informing pt to follow up with cardiologist regarding medications.

## 2020-11-12 NOTE — Telephone Encounter (Signed)
I would recommend he direct his concerns to his Cardiologist who is prescribing those medications for his A. fib.  He last had a visit on 10/28/2020.

## 2020-11-12 NOTE — Telephone Encounter (Signed)
Will route to PCP for review. 

## 2020-11-12 NOTE — Progress Notes (Signed)
Anesthesia Chart Review: Same day workup  60 y.o. male with complex medical history including severe COPD and chronic cor pulmonale with pulmonary hypertension, coronary artery disease s/p stenting to mid RCA in 2018, paroxysmal atrial fibrillation with RVR, hypertensive cardiomyopathy, dietary and medication noncompliance, ESRD on HD MWF, chronic hepatitis B and C with cirrhosis of the liver and ascites (requiring intermittent paracentesis- last 10/28/2020, 4L removed), tobaccouse, smokes 1/2 ppd, h/omarijuana use andprior cocaine use quit in 2017. Not on anticoagulation due to cirrhosis of liver and non-compliance with Warfarin.    Frequent ED visits/hospitalizations for missed dialysis and medication noncompliance. He was hospitalized 09/07/2020 - 09/12/2020 with acute metabolic encephalopathy likely secondary to acetaminophen overdose, electrolyte abnormalities following missed dialysis, and hypoglycemia  Last seen by cardiology 10/28/2020.  Per note, "Patient presents for follow-up of atrial fibrillation and heart failure.  EKG today reveals patient remains in atrial fibrillation with controlled ventricular response.  He is feeling well since recent hospitalization and without clinical signs of acute heart failure.  Patient reports medication compliance, however it is difficult to discern whether he is certainly taking medications as directed.  Patient is requesting refill of albuterol and Symbicort inhalers, he states he has a appointment with his PCP scheduled at the end of this month.  I will therefore provide him with 1 refill of each inhaler to get him through until that appointment with his primary care provider.  We will defer further management to PCP."  Will need day of surgery labs and evaluation.  EKG 10/28/2020: Atrial fibrillation.  Ventricular rate 71.  Left bundle branch block.  PORTABLE CHEST 1 VIEW 09/20/2020: COMPARISON:  09/07/2020  FINDINGS: Cardiomegaly. Both lungs are  clear. The visualized skeletal structures are unremarkable.  IMPRESSION: Cardiomegaly without acute abnormality of the lungs in AP portable projection.  Spirometry 10/13/2017: FVC-%Pred-Pre Latest Units: % 73  FEV1-%Pred-Pre Latest Units: % 64  FEV1FVC-%Pred-Pre Latest Units: % 88    TTE 02/05/2020: 1. Left ventricular ejection fraction, by estimation, is 50 to 55%. The  left ventricle has normal function. There is severe left ventricular  hypertrophy. Left ventricular diastolic function could not be evaluated. I  cannot exclude septal hypokinesis.  Endocardium not well visualized. septal motion also is consistent with  LBBB.  2. Right ventricular systolic function is mildly reduced. The right  ventricular size is moderately enlarged. Severely increased right  ventricular wall thickness. There is moderately elevated pulmonary artery  systolic pressure. The estimated right  ventricular systolic pressure is 62.6 mmHg.  3. Left atrial size was severely dilated.  4. Right atrial size was severely dilated.  5. The mitral valve is normal in structure. Mild mitral valve  regurgitation.  6. Dilated TV annulus. Tricuspid valve regurgitation is moderate to  severe.  7. AV sclerosis without stenosis. The aortic valve is tricuspid. Aortic  valve regurgitation is trivial. Mild aortic valve sclerosis is present,  with no evidence of aortic valve stenosis. Aortic valve mean gradient  measures 8.3 mmHg.  8. The inferior vena cava is dilated in size with <50% respiratory  variability, suggesting right atrial pressure of 15 mmHg.   Cath and PCI 02/22/2017: NSTEMI Severe single vessel coronary artery disease with high grade prox RCA stenosis Successful PTCA and stent placement Xience Alpine 3.5 X 23 mm 0% residual stenosis. TIMI III flow.  Elevated right atrial pressures Severe pulmonary hypertension, likely WHO Grp II Elevated LVEDP  Recommendations: In view of his allergy  to ASA, and increased bleeding risk with triple  therapy, would recommend clopidogrel 75 mg daily for at least 6 months Also, given his Afib and high stroke risk (CHA2DS2-Vasc score 3), recommend starting on warfarin.    Wynonia Musty [facility] Stay [facility]/Anesthesiology Phone [phone] 11/12/2020 4:24 PM

## 2020-11-13 ENCOUNTER — Other Ambulatory Visit: Payer: Self-pay

## 2020-11-13 ENCOUNTER — Encounter (HOSPITAL_COMMUNITY)
Admission: RE | Disposition: A | Discharge status: Home / Self Care | Payer: Self-pay | Source: Home / Self Care | Attending: Orthopedic Surgery

## 2020-11-13 ENCOUNTER — Ambulatory Visit (HOSPITAL_COMMUNITY): Payer: Medicare Other | Admitting: Anesthesiology

## 2020-11-13 ENCOUNTER — Other Ambulatory Visit: Payer: Self-pay | Admitting: Family Medicine

## 2020-11-13 ENCOUNTER — Encounter (HOSPITAL_COMMUNITY): Payer: Self-pay | Admitting: Orthopedic Surgery

## 2020-11-13 ENCOUNTER — Ambulatory Visit (HOSPITAL_COMMUNITY)
Admission: RE | Admit: 2020-11-13 | Discharge: 2020-11-13 | Disposition: A | Discharge status: Home / Self Care | Payer: Medicare Other | Attending: Orthopedic Surgery | Admitting: Orthopedic Surgery

## 2020-11-13 DIAGNOSIS — I70268 Atherosclerosis of native arteries of extremities with gangrene, other extremity: Secondary | ICD-10-CM | POA: Insufficient documentation

## 2020-11-13 DIAGNOSIS — F17210 Nicotine dependence, cigarettes, uncomplicated: Secondary | ICD-10-CM | POA: Insufficient documentation

## 2020-11-13 DIAGNOSIS — E1152 Type 2 diabetes mellitus with diabetic peripheral angiopathy with gangrene: Secondary | ICD-10-CM | POA: Diagnosis not present

## 2020-11-13 DIAGNOSIS — Z79899 Other long term (current) drug therapy: Secondary | ICD-10-CM | POA: Insufficient documentation

## 2020-11-13 DIAGNOSIS — E1122 Type 2 diabetes mellitus with diabetic chronic kidney disease: Secondary | ICD-10-CM | POA: Insufficient documentation

## 2020-11-13 DIAGNOSIS — Z792 Long term (current) use of antibiotics: Secondary | ICD-10-CM | POA: Diagnosis not present

## 2020-11-13 DIAGNOSIS — Z885 Allergy status to narcotic agent status: Secondary | ICD-10-CM | POA: Diagnosis not present

## 2020-11-13 DIAGNOSIS — Z886 Allergy status to analgesic agent status: Secondary | ICD-10-CM | POA: Diagnosis not present

## 2020-11-13 DIAGNOSIS — Z888 Allergy status to other drugs, medicaments and biological substances status: Secondary | ICD-10-CM | POA: Diagnosis not present

## 2020-11-13 DIAGNOSIS — N186 End stage renal disease: Secondary | ICD-10-CM | POA: Insufficient documentation

## 2020-11-13 DIAGNOSIS — M792 Neuralgia and neuritis, unspecified: Secondary | ICD-10-CM

## 2020-11-13 DIAGNOSIS — I96 Gangrene, not elsewhere classified: Secondary | ICD-10-CM | POA: Diagnosis present

## 2020-11-13 HISTORY — PX: AMPUTATION: SHX166

## 2020-11-13 LAB — POCT I-STAT, CHEM 8
BUN: 15 mg/dL (ref 6–20)
Calcium, Ion: 0.9 mmol/L — ABNORMAL LOW (ref 1.15–1.40)
Chloride: 95 mmol/L — ABNORMAL LOW (ref 98–111)
Creatinine, Ser: 5.6 mg/dL — ABNORMAL HIGH (ref 0.61–1.24)
Glucose, Bld: 87 mg/dL (ref 70–99)
HCT: 32 % — ABNORMAL LOW (ref 39.0–52.0)
Hemoglobin: 10.9 g/dL — ABNORMAL LOW (ref 13.0–17.0)
Potassium: 3.7 mmol/L (ref 3.5–5.1)
Sodium: 134 mmol/L — ABNORMAL LOW (ref 135–145)
TCO2: 35 mmol/L — ABNORMAL HIGH (ref 22–32)

## 2020-11-13 SURGERY — AMPUTATION DIGIT
Anesthesia: General | Laterality: Right

## 2020-11-13 MED ORDER — LIDOCAINE 2% (20 MG/ML) 5 ML SYRINGE
INTRAMUSCULAR | Status: DC | PRN
  Administered 2020-11-13: 60 mg via INTRAVENOUS

## 2020-11-13 MED ORDER — CHLORHEXIDINE GLUCONATE 0.12 % MT SOLN
OROMUCOSAL | Status: AC
  Administered 2020-11-13: 15 mL via OROMUCOSAL
  Filled 2020-11-13: qty 15

## 2020-11-13 MED ORDER — OXYCODONE HCL 5 MG PO TABS
5.0000 mg | ORAL_TABLET | Freq: Once | ORAL | Status: AC | PRN
Start: 2020-11-13 — End: 2020-11-13
  Administered 2020-11-13: 5 mg via ORAL

## 2020-11-13 MED ORDER — PROPOFOL 10 MG/ML IV BOLUS
INTRAVENOUS | Status: AC
  Filled 2020-11-13: qty 20

## 2020-11-13 MED ORDER — DEXAMETHASONE SODIUM PHOSPHATE 10 MG/ML IJ SOLN
INTRAMUSCULAR | Status: AC
  Filled 2020-11-13: qty 1

## 2020-11-13 MED ORDER — BUPIVACAINE HCL 0.25 % IJ SOLN
INTRAMUSCULAR | Status: DC | PRN
  Administered 2020-11-13: 10 mL

## 2020-11-13 MED ORDER — FENTANYL CITRATE (PF) 250 MCG/5ML IJ SOLN: INTRAMUSCULAR | Status: DC | PRN

## 2020-11-13 MED ORDER — BUPIVACAINE HCL (PF) 0.25 % IJ SOLN
INTRAMUSCULAR | Status: AC
  Filled 2020-11-13: qty 30

## 2020-11-13 MED ORDER — OXYCODONE HCL 5 MG PO TABS: ORAL_TABLET | ORAL | 0 refills | Status: AC

## 2020-11-13 MED ORDER — 0.9 % SODIUM CHLORIDE (POUR BTL) OPTIME
TOPICAL | Status: DC | PRN
  Administered 2020-11-13: 1000 mL

## 2020-11-13 MED ORDER — FENTANYL CITRATE (PF) 250 MCG/5ML IJ SOLN
INTRAMUSCULAR | Status: DC | PRN
  Administered 2020-11-13 (×4): 25 ug via INTRAVENOUS
  Administered 2020-11-13: 50 ug via INTRAVENOUS
  Administered 2020-11-13: 25 ug via INTRAVENOUS

## 2020-11-13 MED ORDER — CEFAZOLIN SODIUM-DEXTROSE 2-4 GM/100ML-% IV SOLN
2.0000 g | INTRAVENOUS | Status: AC
  Administered 2020-11-13: 2 g via INTRAVENOUS

## 2020-11-13 MED ORDER — PHENYLEPHRINE HCL-NACL 10-0.9 MG/250ML-% IV SOLN
INTRAVENOUS | Status: DC | PRN
  Administered 2020-11-13: 30 ug/min via INTRAVENOUS

## 2020-11-13 MED ORDER — CEFAZOLIN SODIUM-DEXTROSE 2-4 GM/100ML-% IV SOLN
INTRAVENOUS | Status: AC
  Filled 2020-11-13: qty 100

## 2020-11-13 MED ORDER — PROPOFOL 10 MG/ML IV BOLUS
INTRAVENOUS | Status: DC | PRN
  Administered 2020-11-13: 150 mg via INTRAVENOUS

## 2020-11-13 MED ORDER — FENTANYL CITRATE (PF) 100 MCG/2ML IJ SOLN
25.0000 ug | INTRAMUSCULAR | Status: DC | PRN
  Administered 2020-11-13 (×2): 50 ug via INTRAVENOUS

## 2020-11-13 MED ORDER — DEXAMETHASONE SODIUM PHOSPHATE 10 MG/ML IJ SOLN
INTRAMUSCULAR | Status: DC | PRN
  Administered 2020-11-13: 10 mg via INTRAVENOUS

## 2020-11-13 MED ORDER — FENTANYL CITRATE (PF) 100 MCG/2ML IJ SOLN
INTRAMUSCULAR | Status: AC
  Filled 2020-11-13: qty 2

## 2020-11-13 MED ORDER — MIDAZOLAM HCL 2 MG/2ML IJ SOLN
INTRAMUSCULAR | Status: AC
  Filled 2020-11-13: qty 2

## 2020-11-13 MED ORDER — PHENYLEPHRINE 40 MCG/ML (10ML) SYRINGE FOR IV PUSH (FOR BLOOD PRESSURE SUPPORT)
PREFILLED_SYRINGE | INTRAVENOUS | Status: AC
  Filled 2020-11-13: qty 10

## 2020-11-13 MED ORDER — OXYCODONE HCL 5 MG/5ML PO SOLN
5.0000 mg | Freq: Once | ORAL | Status: AC | PRN
Start: 2020-11-13 — End: 2020-11-13

## 2020-11-13 MED ORDER — CHLORHEXIDINE GLUCONATE 0.12 % MT SOLN: 15.0000 mL | Freq: Once | OROMUCOSAL | Status: AC

## 2020-11-13 MED ORDER — ORAL CARE MOUTH RINSE: 15.0000 mL | Freq: Once | OROMUCOSAL | Status: AC

## 2020-11-13 MED ORDER — ONDANSETRON HCL 4 MG/2ML IJ SOLN
INTRAMUSCULAR | Status: AC
  Filled 2020-11-13: qty 2

## 2020-11-13 MED ORDER — OXYCODONE HCL 5 MG PO TABS
ORAL_TABLET | ORAL | Status: AC
  Filled 2020-11-13: qty 1

## 2020-11-13 MED ORDER — PROPOFOL 10 MG/ML IV BOLUS
INTRAVENOUS | Status: AC
  Filled 2020-11-13: qty 40

## 2020-11-13 MED ORDER — ONDANSETRON HCL 4 MG/2ML IJ SOLN
INTRAMUSCULAR | Status: DC | PRN
  Administered 2020-11-13: 4 mg via INTRAVENOUS

## 2020-11-13 MED ORDER — FENTANYL CITRATE (PF) 250 MCG/5ML IJ SOLN
INTRAMUSCULAR | Status: AC
  Filled 2020-11-13: qty 5

## 2020-11-13 MED ORDER — SODIUM CHLORIDE 0.9 % IV SOLN: INTRAVENOUS | Status: DC

## 2020-11-13 SURGICAL SUPPLY — 30 items
BLADE LONG MED 31X9 (MISCELLANEOUS) ×2 IMPLANT
BNDG CMPR 9X4 STRL LF SNTH (GAUZE/BANDAGES/DRESSINGS) ×1
BNDG COHESIVE 1X5 TAN STRL LF (GAUZE/BANDAGES/DRESSINGS) ×2 IMPLANT
BNDG ESMARK 4X9 LF (GAUZE/BANDAGES/DRESSINGS) ×2 IMPLANT
BNDG GAUZE ELAST 4 BULKY (GAUZE/BANDAGES/DRESSINGS) IMPLANT
CORD BIPOLAR FORCEPS 12FT (ELECTRODE) ×2 IMPLANT
COVER WAND RF STERILE (DRAPES) ×2 IMPLANT
CUFF TOURN SGL QUICK 18X4 (TOURNIQUET CUFF) ×2 IMPLANT
DURAPREP 26ML APPLICATOR (WOUND CARE) ×2 IMPLANT
GAUZE SPONGE 4X4 12PLY STRL (GAUZE/BANDAGES/DRESSINGS) IMPLANT
GAUZE XEROFORM 1X8 LF (GAUZE/BANDAGES/DRESSINGS) ×2 IMPLANT
GLOVE BIO SURGEON STRL SZ7.5 (GLOVE) ×2 IMPLANT
GLOVE SRG 8 PF TXTR STRL LF DI (GLOVE) ×1 IMPLANT
GLOVE SURG UNDER POLY LF SZ8 (GLOVE) ×2
GOWN STRL REUS W/ TWL LRG LVL3 (GOWN DISPOSABLE) ×1 IMPLANT
GOWN STRL REUS W/ TWL XL LVL3 (GOWN DISPOSABLE) ×1 IMPLANT
GOWN STRL REUS W/TWL LRG LVL3 (GOWN DISPOSABLE) ×2
GOWN STRL REUS W/TWL XL LVL3 (GOWN DISPOSABLE) ×2
KIT BASIN OR (CUSTOM PROCEDURE TRAY) ×2 IMPLANT
KIT TURNOVER KIT B (KITS) ×2 IMPLANT
NS IRRIG 1000ML POUR BTL (IV SOLUTION) ×2 IMPLANT
PACK ORTHO EXTREMITY (CUSTOM PROCEDURE TRAY) ×2 IMPLANT
PAD ARMBOARD 7.5X6 YLW CONV (MISCELLANEOUS) ×4 IMPLANT
PAD CAST 4YDX4 CTTN HI CHSV (CAST SUPPLIES) IMPLANT
PADDING CAST COTTON 4X4 STRL (CAST SUPPLIES)
SPECIMEN JAR SMALL (MISCELLANEOUS) ×2 IMPLANT
SUT VIC AB 4-0 P-3 18XBRD (SUTURE) IMPLANT
SUT VIC AB 4-0 P3 18 (SUTURE) ×2
TOWEL GREEN STERILE (TOWEL DISPOSABLE) ×2 IMPLANT
UNDERPAD 30X36 HEAVY ABSORB (UNDERPADS AND DIAPERS) ×2 IMPLANT

## 2020-11-13 NOTE — Transfer of Care (Signed)
Immediate Anesthesia Transfer of Care Note  Patient: [name]  Procedure(s) Performed: RIGHT INDEX FINGER AMPUTATION AND RIGHT RING FINGER AMPUTATION (Right )  Patient Location: PACU  Anesthesia Type:General  Level of Consciousness: awake, alert  and oriented  Airway & Oxygen Therapy: Patient Spontanous Breathing and Patient connected to nasal cannula oxygen  Post-op Assessment: Report given to RN and Post -op Vital signs reviewed and stable  Post vital signs: Reviewed and stable  Last Vitals:  Vitals Value Taken Time  BP    Temp    Pulse 119 11/13/20 1301  Resp 21 11/13/20 1301  SpO2 94 % 11/13/20 1301  Vitals shown include unvalidated device data.  Last Pain:  Vitals:   11/13/20 0909  TempSrc:   PainSc: 10-Worst pain ever      Patients Stated Pain Goal: 8 (97/98/92 1194)  Complications: No complications documented.

## 2020-11-13 NOTE — Telephone Encounter (Signed)
  Notes to clinic:  medication was filled by a different provider  Review for continued use and refill    Requested Prescriptions  Pending Prescriptions Disp Refills   gabapentin (NEURONTIN) 300 MG capsule [Pharmacy Med Name: GABAPENTIN 300 MG CAPSULE] 60 capsule 3    Sig: TAKE 1 CAPSULE BY MOUTH IN THE MORNING AND AT BEDTIME      Neurology: Anticonvulsants - gabapentin Passed - 11/13/2020  2:06 PM      Passed - Valid encounter within last 12 months    Recent Outpatient Visits           3 months ago Felon of finger of right hand   [place], Charlane Ferretti, MD   4 months ago Gross hematuria   [place], Charlane Ferretti, MD   4 months ago AF (paroxysmal atrial fibrillation) [facility])   Stark, Enobong, MD       Future Appointments             In 3 weeks Charlott Rakes, MD [facility]   In 2 months [place], [place], [place] [facility]

## 2020-11-13 NOTE — Anesthesia Procedure Notes (Signed)
Procedure Name: LMA Insertion Date/Time: 11/13/2020 11:21 AM Performed by: Flossie Dibble, CRNA Pre-anesthesia Checklist: Patient identified, Patient being monitored, Emergency Drugs available, Timeout performed and Suction available Patient Re-evaluated:Patient Re-evaluated prior to induction Oxygen Delivery Method: Circle System Utilized Preoxygenation: Pre-oxygenation with 100% oxygen Induction Type: IV induction LMA: LMA inserted LMA Size: 4.0 Number of attempts: 1 Placement Confirmation: positive ETCO2 and breath sounds checked- equal and bilateral Tube secured with: Tape Dental Injury: Teeth and Oropharynx as per pre-operative assessment

## 2020-11-13 NOTE — H&P (Signed)
[name] is an 60 y.o. male.   Chief Complaint: digit ischemia HPI: 60 yo male s/p amputation digits for ischemia.  Continued ischemia and pain.  Poor wound healing.  He wishes to proceed with amputation at mp level to gain healing.  Allergies:  Allergies  Allergen Reactions  . Tramadol Itching and Other (See Comments)    Other reaction(s): Unknown  . Grass Extracts [Gramineae Pollens]     Other reaction(s): Sneezing  . Morphine And Related Other (See Comments)    Stomach pain  . Pollen Extract Other (See Comments)    Other reaction(s): Sneezing (finding)  . Acetaminophen Nausea Only    Stomach ache   . Aspirin Itching and Other (See Comments)    STOMACH PAIN  Other reaction(s): Unknown  . Clonidine Derivatives Itching    Past Medical History:  Diagnosis Date  . Anemia   . Anxiety   . Arthritis    left shoulder  . Atherosclerosis of aorta ([place])   . Cardiomegaly   . Chest pain    DATE UNKNOWN, C/O PERIODICALLY  . Cocaine abuse ([place])   . COPD exacerbation ([place]) 08/17/2016  . Coronary artery disease    stent 02/22/17  . Dysrhythmia   . ESRD (end stage renal disease) on dialysis ([place])    "[name]; M-W-F" (07/04/2017)  . GERD (gastroesophageal reflux disease)    DATE UNKNOWN  . Hemorrhoids   . Hepatitis B, chronic ([place])   . Hepatitis C   . History of kidney stones   . Hyperkalemia   . Hypertension   . Kidney failure   . Metabolic bone disease    Patient denies  . Mitral stenosis   . Myocardial infarction ([place])   . Pneumonia   . Pulmonary edema   . Solitary rectal ulcer syndrome 07/2017   at flex sig for rectal bleeding  . Tubular adenoma of colon     Past Surgical History:  Procedure Laterality Date  . A/V FISTULAGRAM Left 05/26/2017   Procedure: A/V FISTULAGRAM;  Surgeon: Conrad Caribou, MD;  Location: [place] [facility];  Service: Cardiovascular;  Laterality: Left;  . A/V FISTULAGRAM Right 11/18/2017   Procedure: A/V FISTULAGRAM - Right  Arm;  Surgeon: Elam Dutch, MD;  Location: [place] [facility];  Service: Cardiovascular;  Laterality: Right;  . AMPUTATION Right 09/04/2020   Procedure: RIGHT INDEX AND RIGHT RING FINGER AMPUTATION DIGIT;  Surgeon: Leanora Cover, MD;  Location: [place];  Service: Orthopedics;  Laterality: Right;  . APPLICATION OF WOUND VAC Left 06/14/2017   Procedure: APPLICATION OF WOUND VAC;  Surgeon: Katha Cabal, MD;  Location: [facility];  Service: Vascular;  Laterality: Left;  . AV FISTULA PLACEMENT  2012   BELIEVED WAS PLACED IN JUNE  . AV FISTULA PLACEMENT Right 08/09/2017   Procedure: Creation Right arm ARTERIOVENOUS BRACHIOCEPOHALIC FISTULA;  Surgeon: Elam Dutch, MD;  Location: [facility];  Service: Vascular;  Laterality: Right;  . AV FISTULA PLACEMENT Right 11/22/2017   Procedure: INSERTION OF ARTERIOVENOUS (AV) GORE-TEX GRAFT RIGHT UPPER ARM;  Surgeon: Elam Dutch, MD;  Location: [place];  Service: Vascular;  Laterality: Right;  . BIOPSY  01/25/2018   Procedure: BIOPSY;  Surgeon: Jerene Bears, MD;  Location: [place];  Service: Gastroenterology;;  . BIOPSY  04/10/2019   Procedure: BIOPSY;  Surgeon: Jerene Bears, MD;  Location: [facility];  Service: Gastroenterology;;  . COLONOSCOPY    . COLONOSCOPY WITH PROPOFOL N/A 01/25/2018  Procedure: COLONOSCOPY WITH PROPOFOL;  Surgeon: Jerene Bears, MD;  Location: [place];  Service: Gastroenterology;  Laterality: N/A;  . CORONARY STENT INTERVENTION N/A 02/22/2017   Procedure: CORONARY STENT INTERVENTION;  Surgeon: Nigel Mormon, MD;  Location: [place] [facility];  Service: Cardiovascular;  Laterality: N/A;  . ESOPHAGOGASTRODUODENOSCOPY (EGD) WITH PROPOFOL N/A 01/25/2018   Procedure: ESOPHAGOGASTRODUODENOSCOPY (EGD) WITH PROPOFOL;  Surgeon: Jerene Bears, MD;  Location: [place];  Service: Gastroenterology;  Laterality: N/A;  . ESOPHAGOGASTRODUODENOSCOPY (EGD) WITH PROPOFOL N/A 04/10/2019   Procedure: ESOPHAGOGASTRODUODENOSCOPY  (EGD) WITH PROPOFOL;  Surgeon: Jerene Bears, MD;  Location: [facility];  Service: Gastroenterology;  Laterality: N/A;  . FLEXIBLE SIGMOIDOSCOPY N/A 07/15/2017   Procedure: FLEXIBLE SIGMOIDOSCOPY;  Surgeon: Carol Ada, MD;  Location: [place];  Service: Endoscopy;  Laterality: N/A;  . HEMORRHOID BANDING    . I & D EXTREMITY Left 06/01/2017   Procedure: IRRIGATION AND DEBRIDEMENT LEFT ARM HEMATOMA WITH LIGATION OF LEFT ARM AV FISTULA;  Surgeon: Elam Dutch, MD;  Location: [place];  Service: Vascular;  Laterality: Left;  . I & D EXTREMITY Left 06/14/2017   Procedure: IRRIGATION AND DEBRIDEMENT EXTREMITY;  Surgeon: Katha Cabal, MD;  Location: [facility];  Service: Vascular;  Laterality: Left;  . INSERTION OF DIALYSIS CATHETER  05/30/2017  . INSERTION OF DIALYSIS CATHETER N/A 05/30/2017   Procedure: INSERTION OF DIALYSIS CATHETER;  Surgeon: Elam Dutch, MD;  Location: [place];  Service: Vascular;  Laterality: N/A;  . IR PARACENTESIS  08/30/2017  . IR PARACENTESIS  09/29/2017  . IR PARACENTESIS  10/28/2017  . IR PARACENTESIS  11/09/2017  . IR PARACENTESIS  11/16/2017  . IR PARACENTESIS  11/28/2017  . IR PARACENTESIS  12/01/2017  . IR PARACENTESIS  12/06/2017  . IR PARACENTESIS  01/03/2018  . IR PARACENTESIS  01/23/2018  . IR PARACENTESIS  02/07/2018  . IR PARACENTESIS  02/21/2018  . IR PARACENTESIS  03/06/2018  . IR PARACENTESIS  03/17/2018  . IR PARACENTESIS  04/04/2018  . IR PARACENTESIS  12/28/2018  . IR PARACENTESIS  01/08/2019  . IR PARACENTESIS  01/23/2019  . IR PARACENTESIS  02/01/2019  . IR PARACENTESIS  02/19/2019  . IR PARACENTESIS  03/01/2019  . IR PARACENTESIS  03/15/2019  . IR PARACENTESIS  04/03/2019  . IR PARACENTESIS  04/12/2019  . IR PARACENTESIS  05/01/2019  . IR PARACENTESIS  05/08/2019  . IR PARACENTESIS  05/24/2019  . IR PARACENTESIS  06/12/2019  . IR PARACENTESIS  07/09/2019  . IR PARACENTESIS  07/27/2019  . IR PARACENTESIS  08/09/2019  . IR PARACENTESIS  08/21/2019  .  IR PARACENTESIS  09/17/2019  . IR PARACENTESIS  10/05/2019  . IR PARACENTESIS  10/29/2019  . IR PARACENTESIS  11/08/2019  . IR PARACENTESIS  12/12/2019  . IR PARACENTESIS  01/03/2020  . IR PARACENTESIS  01/10/2020  . IR PARACENTESIS  01/17/2020  . IR PARACENTESIS  01/24/2020  . IR PARACENTESIS  01/31/2020  . IR PARACENTESIS  02/07/2020  . IR PARACENTESIS  02/21/2020  . IR PARACENTESIS  05/21/2020  . IR PARACENTESIS  10/10/2020  . IR PARACENTESIS  10/28/2020  . IR RADIOLOGIST EVAL & MGMT  02/14/2018  . IR RADIOLOGIST EVAL & MGMT  02/22/2019  . LEFT HEART CATH AND CORONARY ANGIOGRAPHY N/A 02/22/2017   Procedure: LEFT HEART CATH AND CORONARY ANGIOGRAPHY;  Surgeon: Nigel Mormon, MD;  Location: [place] [facility];  Service: Cardiovascular;  Laterality: N/A;  . LIGATION OF ARTERIOVENOUS  FISTULA Left 11/10/7780   Procedure: Plication of Left Arm Arteriovenous Fistula;  Surgeon: Elam Dutch, MD;  Location: [place];  Service: Vascular;  Laterality: Left;  . POLYPECTOMY    . POLYPECTOMY  01/25/2018   Procedure: POLYPECTOMY;  Surgeon: Jerene Bears, MD;  Location: [place];  Service: Gastroenterology;;  . REVISON OF ARTERIOVENOUS FISTULA Left 11/02/5359   Procedure: PLICATION OF DISTAL ANEURYSMAL SEGEMENT OF LEFT UPPER ARM ARTERIOVENOUS FISTULA;  Surgeon: Elam Dutch, MD;  Location: [place];  Service: Vascular;  Laterality: Left;  . REVISON OF ARTERIOVENOUS FISTULA Left 4/43/1540   Procedure: Plication of Left Upper Arm Fistula ;  Surgeon: Waynetta Sandy, MD;  Location: [place];  Service: Vascular;  Laterality: Left;  . SKIN GRAFT SPLIT THICKNESS LEG / FOOT Left    SKIN GRAFT SPLIT THICKNESS LEFT ARM DONOR SITE: LEFT ANTERIOR THIGH  . SKIN SPLIT GRAFT Left 07/04/2017   Procedure: SKIN GRAFT SPLIT THICKNESS LEFT ARM DONOR SITE: LEFT ANTERIOR THIGH;  Surgeon: Elam Dutch, MD;  Location: [place];  Service: Vascular;  Laterality: Left;  . THROMBECTOMY W/ EMBOLECTOMY Left 06/05/2017   Procedure:  EXPLORATION OF LEFT ARM FOR BLEEDING; OVERSEWED PROXIMAL FISTULA;  Surgeon: Angelia Mould, MD;  Location: [place];  Service: Vascular;  Laterality: Left;  . WOUND EXPLORATION Left 06/03/2017   Procedure: WOUND EXPLORATION WITH WOUND VAC APPLICATION TO LEFT ARM;  Surgeon: Angelia Mould, MD;  Location: [facility];  Service: Vascular;  Laterality: Left;    Family History: Family History  Problem Relation Age of Onset  . Heart disease Mother   . Lung cancer Mother   . Heart disease Father   . Malignant hyperthermia Father   . COPD Father   . Throat cancer Sister   . Esophageal cancer Sister   . Hypertension Other   . COPD Other   . Throat cancer Sister   . Colon cancer Neg Hx   . Colon polyps Neg Hx   . Rectal cancer Neg Hx   . Stomach cancer Neg Hx     Social History:   reports that he has been smoking cigarettes. He started smoking about 47 years ago. He has a 21.50 pack-year smoking history. He has never used smokeless tobacco. He reports previous alcohol use. He reports current drug use. Drugs: Marijuana and Cocaine.  Medications: Medications Prior to Admission  Medication Sig Dispense Refill  . acetaminophen (TYLENOL) 500 MG tablet Take 500-1,000 mg by mouth every 6 (six) hours as needed (pain.).    Marland Kitchen albuterol (VENTOLIN HFA) 108 (90 Base) MCG/ACT inhaler Inhale 2 puffs into the lungs every 4 (four) hours as needed for wheezing or shortness of breath. 8.5 each 0  . amiodarone (PACERONE) 200 MG tablet TAKE 1 TABLET BY MOUTH EVERY DAY (Patient taking differently: Take 200 mg by mouth in the morning.) 90 tablet 1  . cetirizine (ZYRTEC) 10 MG tablet Take 10 mg by mouth daily as needed for allergies.    . cinacalcet (SENSIPAR) 30 MG tablet TAKE 2 TABLETS (60 MG TOTAL) BY MOUTH EVERY MONDAY, WEDNESDAY, AND FRIDAY WITH HEMODIALYSIS. 60 tablet 0  . diltiazem (CARDIZEM CD) 120 MG 24 hr capsule Take 1 capsule (120 mg total) by mouth daily. (Patient taking differently: Take 120 mg  by mouth in the morning.) 90 capsule 3  . doxycycline (VIBRAMYCIN) 100 MG capsule Take 100 mg by mouth 2 (two) times daily.    Marland Kitchen gabapentin (NEURONTIN) 300 MG capsule TAKE 1  CAPSULE (300 MG TOTAL) BY MOUTH AT BEDTIME. (Patient taking differently: Take 300 mg by mouth every 8 (eight) hours as needed (pain.).) 30 capsule 0  . lactulose (CHRONULAC) 10 GM/15ML solution Take 30 mLs (20 g total) by mouth daily. (Patient taking differently: Take 20 g by mouth daily as needed (constipation).) 236 mL 0  . lidocaine-prilocaine (EMLA) cream Apply 1 application topically every Monday, Wednesday, and Friday with hemodialysis. Applied sparingly for port access    . metoprolol succinate (TOPROL XL) 25 MG 24 hr tablet Take 1 tablet (25 mg total) by mouth daily. (Patient taking differently: Take 25 mg by mouth in the morning.) 30 tablet 3  . ondansetron (ZOFRAN) 4 MG tablet Take 1 tablet (4 mg total) by mouth every 8 (eight) hours as needed for nausea or vomiting. 30 tablet 1  . Oxycodone HCl 10 MG TABS TAKE 1 TABLET (10 MG TOTAL) BY MOUTH THREE TIMES DAILY AS NEEDED PAIN. (Patient taking differently: Take 10 mg by mouth 3 (three) times daily as needed (pain).) 10 tablet 0  . pantoprazole (PROTONIX) 40 MG tablet Take 1 tablet (40 mg total) by mouth 2 (two) times daily. 60 tablet 0  . polyethylene glycol (MIRALAX / GLYCOLAX) 17 g packet Take 17 g by mouth daily as needed (constipation).    . sevelamer carbonate (RENVELA) 800 MG tablet Take 2,400 mg by mouth See admin instructions. Take 3 tablets (2400 mg) by mouth with each meals and with each snack    . sulfamethoxazole-trimethoprim (BACTRIM DS) 800-160 MG tablet Take 1 tablet by mouth 2 (two) times daily.    . budesonide-formoterol (SYMBICORT) 160-4.5 MCG/ACT inhaler Inhale 2 puffs into the lungs daily. (Patient not taking: Reported on 11/07/2020) 1 each 0    Results for orders placed or performed during the hospital encounter of 11/13/20 (from the past 48 hour(s))   I-STAT, chem 8     Status: Abnormal   Collection Time: 11/13/20  9:35 AM  Result Value Ref Range   Sodium 134 (L) 135 - 145 mmol/L   Potassium 3.7 3.5 - 5.1 mmol/L   Chloride 95 (L) 98 - 111 mmol/L   BUN 15 6 - 20 mg/dL   Creatinine, Ser 5.60 (H) 0.61 - 1.24 mg/dL   Glucose, Bld 87 70 - 99 mg/dL    Comment: Glucose reference range applies only to samples taken after fasting for at least 8 hours.   Calcium, Ion 0.90 (L) 1.15 - 1.40 mmol/L   TCO2 35 (H) 22 - 32 mmol/L   Hemoglobin 10.9 (L) 13.0 - 17.0 g/dL   HCT 32.0 (L) 39.0 - 52.0 %   *Note: Due to a large number of results and/or encounters for the requested time period, some results have not been displayed. A complete set of results can be found in Results Review.    No results found.   A comprehensive review of systems was negative.  Blood pressure (!) 119/45, pulse (!) 53, temperature 98.8 F (37.1 C), temperature source Oral, resp. rate 16, height 5\' 9"  (1.753 m), weight 68 kg, SpO2 97 %.  General appearance: alert, cooperative and appears stated age Head: Normocephalic, without obvious abnormality, atraumatic Neck: supple, symmetrical, trachea midline Cardio: regular rate and rhythm Resp: clear to auscultation bilaterally Extremities: ischemia of index and ring fingers.  Poorly healing wounds. Pulses: 2+ and symmetric Skin: Skin color, texture, turgor normal. No rashes or lesions Neurologic: Grossly normal Incision/Wound: as above  Assessment/Plan Right index and ring finger ischemia with  poorly healing wounds.  Plan amputation at mp joint right index and ring fingers.  Non operative and operative treatment options have been discussed with the patient and patient wishes to proceed with operative treatment. Risks, benefits, and alternatives of surgery have been discussed and the patient agrees with the plan of care.   Leanora Cover 11/13/2020, 10:39 AM

## 2020-11-13 NOTE — Progress Notes (Addendum)
Patient belligerent and uncooperative/poor historian for meds. Unable to obtain accurate details on which meds were  consumed this morning. Patient stated " I took all of them yesterday and one purple pill this morning". Unable to identify purple pill. Patient stated "Stop asking me the same questions, I told you I took everything yesterday." BP 87/38 heart rate- af-fib ranging from 55-78. BP cuff positioned on right leg due to AV grafts in both arms.  Dr. Fransisco Beau made aware. Cuff repositioned to left upper arm per Dr. Fransisco Beau. BP now 123/71.

## 2020-11-13 NOTE — Anesthesia Postprocedure Evaluation (Signed)
Anesthesia Post Note  Patient: [name]  Procedure(s) Performed: RIGHT INDEX FINGER AMPUTATION AND RIGHT RING FINGER AMPUTATION (Right )     Patient location during evaluation: PACU Anesthesia Type: General Level of consciousness: awake and alert Pain management: pain level controlled Vital Signs Assessment: post-procedure vital signs reviewed and stable Respiratory status: spontaneous breathing, nonlabored ventilation and respiratory function stable Cardiovascular status: blood pressure returned to baseline and stable Postop Assessment: no apparent nausea or vomiting Anesthetic complications: no   No complications documented.  Last Vitals:  Vitals:   11/13/20 1318 11/13/20 1330  BP:  139/72  Pulse: (!) 44 63  Resp: (!) 22 (!) 22  Temp:  (!) 36.2 C  SpO2: 94% 92%                  Audry Pili

## 2020-11-13 NOTE — Discharge Instructions (Addendum)
[facility] Instructions °Hand Surgery ° °Wound Care: °Keep your hand elevated above the level of your heart.  Do not allow it to dangle by your side.  Keep the dressing dry and do not remove it unless your doctor advises you to do so.  He will usually change it at the time of your post-op visit.  Moving your fingers is advised to stimulate circulation but will depend on the site of your surgery.  If you have a splint applied, your doctor will advise you regarding movement. ° °Activity: °Do not drive or operate machinery today.  Rest today and then you may return to your normal activity and work as indicated by your physician. ° °Diet:  °Drink liquids today or eat a light diet.  You may resume a regular diet tomorrow.   ° °General expectations: °Pain for two to three days. °Fingers may become slightly swollen. ° °Call your doctor if any of the following occur: °Severe pain not relieved by pain medication. °Elevated temperature. °Dressing soaked with blood. °Inability to move fingers. °White or bluish color to fingers. ° °

## 2020-11-13 NOTE — Op Note (Addendum)
NAME: Jabin Tapp [facility] MEDICAL RECORD NO: [id number] DATE OF BIRTH: [birth year] FACILITY[name] LOCATION: [facility] OR PHYSICIAN: Tennis Must, MD   OPERATIVE REPORT   DATE OF PROCEDURE: 11/13/20    PREOPERATIVE DIAGNOSIS:   Right index and ring finger ischemia and necrosis   POSTOPERATIVE DIAGNOSIS:   Right index and ring finger ischemia and necrosis   PROCEDURE:   1.  Right index finger amputation through MP joint 2.  Right ring finger amputation through MP joint   SURGEON:  Leanora Cover, M.D.   ASSISTANT: none   ANESTHESIA:  General   INTRAVENOUS FLUIDS:  Per anesthesia flow sheet.   ESTIMATED BLOOD LOSS:  Minimal.   COMPLICATIONS:  None.   SPECIMENS:   Right index and ring finger to pathology for gross exam   TOURNIQUET TIME:   Esmarch bandage at wrist: 68 minutes   DISPOSITION:  Stable to PACU.   INDICATIONS: 60 year old male with end-stage renal failure with ischemia and necrosis of the index and ring finger.  Had previous amputations through the middle phalanx with nonhealing wounds and continued pain.  He wishes to have amputation through the MP joints. Risks, benefits and alternatives of surgery were discussed including the risks of blood loss, infection, damage to nerves, vessels, tendons, ligaments, bone for surgery, need for additional surgery, complications with wound healing, continued pain, stiffness.  He voiced understanding of these risks and elected to proceed.  OPERATIVE COURSE:  After being identified preoperatively by myself,  the patient and I agreed on the procedure and site of the procedure.  The surgical site was marked.  Surgical consent had been signed. He was given IV antibiotics as preoperative antibiotic prophylaxis. He was transferred to the operating room and placed on the operating table in supine position with the Right upper extremity on an arm board.  General anesthesia was induced by the anesthesiologist.  Right upper extremity was prepped  and draped in normal sterile orthopedic fashion.  A surgical pause was performed between the surgeons, anesthesia, and operating room staff and all were in agreement as to the patient, procedure, and site of procedure.  Tourniquet was not inflated.    An Esmarch bandage was used as a tourniquet at the wrist.  An incision was made around the index finger leaving a volar soft tissue flap.  This is carried in subcutaneous tissues by spreading technique.  Bipolar electrocautery was used to obtain hemostasis.  The radial and ulnar digital nerve and vessel were identified.  The digital nerves were placed under traction bipolar and allowed to retract into the soft tissues.  The digital arteries were tied using 4-0 Vicryl suture and then treated with bipolar distal to this.  The soft tissues were then released including the flexor tendons.  The finger was amputated through the MP joint.  Bipolar electrocautery was used to obtain hemostasis.  The wound was copiously irrigated with sterile saline.  It was reapproximated using 5-0 Monocryl suture in a horizontal mattress fashion.  Good tension-free reapproximation of soft tissues was obtained.  In the ring finger again an incision was made leaving a volar soft tissue flap.  This was carried in subcutaneous tissues by spreading technique.  Bipolar electrocautery is used to obtain hemostasis.  The radial and ulnar digital nerve and artery were identified.  The digital nerves were placed under traction bipolar and allowed to retract into the soft tissue.  The digital arteries were tied using 4-0 Vicryl suture.  There were then treated with  bipolar distal to the type.  The finger was then amputated through the MP joint.  The wound was copiously irrigated with sterile saline.  5-0 Monocryl suture was used in an interrupted and horizontal mattress fashion to reapproximate the soft tissues.  Good tension-free reapproximation was obtained.  The wounds were injected with quarter  percent plain Marcaine to aid in postoperative analgesia.  There were then dressed with sterile Xeroform 4 x 4's and wrapped with Kerlix and an Ace bandage.  The Esmarch was removed at 68 minutes.  Fingertips were pink with brisk capillary refill after deflation of tourniquet.  The operative  drapes were broken down.  The patient was awoken from anesthesia safely.  He was transferred back to the stretcher and taken to PACU in stable condition.  I will see him back in the office in 1 week for postoperative followup.  I will give him a prescription for Oxycodone 5 mg 1 p.o. every 6 hours as needed pain dispense # 20.  He is currently on Bactrim and doxycycline.  Leanora Cover, MD Electronically signed, 11/13/20

## 2020-11-13 NOTE — Progress Notes (Signed)
Orthopedic Tech Progress Note Patient Details:  [name] 27-Jan-1961 [id number]  Ortho Devices Type of Ortho Device: Arm sling Ortho Device/Splint Location: RUE Ortho Device/Splint Interventions: Ordered       Michie Molnar A Adena Sima 11/13/2020, 1:41 PM

## 2020-11-14 ENCOUNTER — Encounter (HOSPITAL_COMMUNITY): Payer: Self-pay | Admitting: Orthopedic Surgery

## 2020-11-17 ENCOUNTER — Other Ambulatory Visit: Payer: Self-pay | Admitting: Nephrology

## 2020-11-17 ENCOUNTER — Emergency Department (HOSPITAL_COMMUNITY)
Admission: EM | Admit: 2020-11-17 | Discharge: 2020-11-17 | Disposition: A | Discharge status: Home / Self Care | Payer: Medicare Other | Attending: Emergency Medicine | Admitting: Emergency Medicine

## 2020-11-17 ENCOUNTER — Ambulatory Visit: Payer: Self-pay | Admitting: *Deleted

## 2020-11-17 DIAGNOSIS — Z5321 Procedure and treatment not carried out due to patient leaving prior to being seen by health care provider: Secondary | ICD-10-CM | POA: Diagnosis not present

## 2020-11-17 DIAGNOSIS — K7290 Hepatic failure, unspecified without coma: Secondary | ICD-10-CM

## 2020-11-17 DIAGNOSIS — R0602 Shortness of breath: Secondary | ICD-10-CM | POA: Insufficient documentation

## 2020-11-17 LAB — COMPREHENSIVE METABOLIC PANEL
ALT: 6 U/L (ref 0–44)
AST: 29 U/L (ref 15–41)
Albumin: 3.2 g/dL — ABNORMAL LOW (ref 3.5–5.0)
Alkaline Phosphatase: 210 U/L — ABNORMAL HIGH (ref 38–126)
Anion gap: 15 (ref 5–15)
BUN: 42 mg/dL — ABNORMAL HIGH (ref 6–20)
CO2: 27 mmol/L (ref 22–32)
Calcium: 9.4 mg/dL (ref 8.9–10.3)
Chloride: 94 mmol/L — ABNORMAL LOW (ref 98–111)
Creatinine, Ser: 9.47 mg/dL — ABNORMAL HIGH (ref 0.61–1.24)
GFR, Estimated: 6 mL/min — ABNORMAL LOW (ref >60)
Glucose, Bld: 97 mg/dL (ref 70–99)
Potassium: 4.6 mmol/L (ref 3.5–5.1)
Sodium: 136 mmol/L (ref 135–145)
Total Bilirubin: 0.9 mg/dL (ref 0.3–1.2)
Total Protein: 7.1 g/dL (ref 6.5–8.1)

## 2020-11-17 LAB — CBC
HCT: 31.4 % — ABNORMAL LOW (ref 39.0–52.0)
Hemoglobin: 10.7 g/dL — ABNORMAL LOW (ref 13.0–17.0)
MCH: 28.9 pg (ref 26.0–34.0)
MCHC: 34.1 g/dL (ref 30.0–36.0)
MCV: 84.9 fL (ref 80.0–100.0)
Platelets: 176 10*3/uL (ref 150–400)
RBC: 3.7 MIL/uL — ABNORMAL LOW (ref 4.22–5.81)
RDW: 14.5 % (ref 11.5–15.5)
WBC: 8.6 10*3/uL (ref 4.0–10.5)
nRBC: 0 % (ref 0.0–0.2)

## 2020-11-17 LAB — LIPASE, BLOOD: Lipase: 34 U/L (ref 11–51)

## 2020-11-17 LAB — SURGICAL PATHOLOGY

## 2020-11-17 NOTE — ED Provider Notes (Signed)
60 year old male with extensive past medical history checked into triage requesting a therapeutic paracentesis.  Has history of ESRD and previous therapeutic paracentesis for symptom relief.  After I reviewed his chart, chief complaint, and current vital signs, I went to see the patient in his room and he had reportedly eloped per nurse Crystal, who stated that he said he couldn't wait here for long. He had also refused to change into a gown or be placed on monitors. Pt marked as eloped.   Shaakira Borrero, Wenda Overland, MD 11/17/20 1215

## 2020-11-17 NOTE — Telephone Encounter (Signed)
Pt called stating he has been having shortness of breath x 2 weeks, and renal has not made an appt for him to have paracentisisi; his shortness of breath is constant and HD has not helped; the pt says his liver leaking fluid into stomach; recommendations made per nurse triage protocol; the pt would like for someone to call IR at  he would like for someone to call IR at [phone] to schedule this procedure; the pt also says he is unable to go to the ED because he has HD today; the pt can be contacted at [phone]; the pt is seen by Dr Margarita Rana at [facility]; will route to office for provider review.  Reason for Disposition . [1] MODERATE difficulty breathing (e.g., speaks in phrases, SOB even at rest, pulse 100-120) AND [2] NEW-onset or WORSE than normal  Answer Assessment - Initial Assessment Questions 1. RESPIRATORY STATUS: "Describe your breathing?" (e.g., wheezing, shortness of breath, unable to speak, severe coughing)      Shortness of breath 2. ONSET: "When did this breathing problem begin?"     11/03/20 3. PATTERN "Does the difficult breathing come and go, or has it been constant since it started?"      constant 4. SEVERITY: "How bad is your breathing?" (e.g., mild, moderate, severe)    - MILD: No SOB at rest, mild SOB with walking, speaks normally in sentences, can lie down, no retractions, pulse < 100.    - MODERATE: SOB at rest, SOB with minimal exertion and prefers to sit, cannot lie down flat, speaks in phrases, mild retractions, audible wheezing, pulse 100-120.    - SEVERE: Very SOB at rest, speaks in single words, struggling to breathe, sitting hunched forward, retractions, pulse > 120     modertate 5. RECURRENT SYMPTOM: "Have you had difficulty breathing before?" If Yes, ask: "When was the last time?" and "What happened that time?"     Yes; pt has cirrohosis and requires paracentisis 6. CARDIAC HISTORY: "Do you have any history of heart disease?" (e.g., heart  attack, angina, bypass surgery, angioplasty)    afib 7. LUNG HISTORY: "Do you have any history of lung disease?"  (e.g., pulmonary embolus, asthma, emphysema)     COPD 8. CAUSE: "What do you think is causing the breathing problem?"    Fluid leaking into abdomen 9. OTHER SYMPTOMS: "Do you have any other symptoms? (e.g., dizziness, runny nose, cough, chest pain, fever)    n/a 10. O2 SATURATION MONITOR:  "Do you use an oxygen saturation monitor (pulse oximeter) at home?" If Yes, "What is your reading (oxygen level) today?" "What is your usual oxygen saturation reading?" (e.g., 95%)     11. PREGNANCY: "Is there any chance you are pregnant?" "When was your last menstrual period?"      n/a 12. TRAVEL: "Have you traveled out of the country in the last month?" (e.g., travel history, exposures)  Protocols used: BREATHING DIFFICULTY-A-AH

## 2020-11-17 NOTE — Telephone Encounter (Signed)
Per notes and appointment desk patient has been informed of appointment.

## 2020-11-17 NOTE — ED Notes (Signed)
Pt walked out of his room and stated that he is leaving because we "aren't doing anything" and continued to walk away. Pt unwilling to return to room for care.

## 2020-11-17 NOTE — ED Triage Notes (Signed)
Pt presents to the ED with SOB and requesting to have a paracentesis. Pt states he has been needing one for 2 weeks. Abdomen is firm. Pt is requesting oxygen but O2 is 98% on RA.

## 2020-11-17 NOTE — ED Notes (Addendum)
Pt stated he is " not going to be here long not going to be here for 4hours, not getting in a gown, don't want door closed, and not getting on the monitor." I said ok and informed charge nurse.

## 2020-11-17 NOTE — Telephone Encounter (Signed)
Please advise 

## 2020-11-18 ENCOUNTER — Ambulatory Visit (HOSPITAL_COMMUNITY)
Admission: RE | Admit: 2020-11-18 | Discharge: 2020-11-18 | Disposition: A | Discharge status: Home / Self Care | Payer: Medicare Other | Source: Ambulatory Visit | Attending: Nephrology | Admitting: Nephrology

## 2020-11-18 ENCOUNTER — Other Ambulatory Visit: Payer: Self-pay

## 2020-11-18 DIAGNOSIS — R188 Other ascites: Secondary | ICD-10-CM | POA: Insufficient documentation

## 2020-11-18 DIAGNOSIS — K7290 Hepatic failure, unspecified without coma: Secondary | ICD-10-CM

## 2020-11-18 HISTORY — PX: IR PARACENTESIS: IMG2679

## 2020-11-18 MED ORDER — LIDOCAINE HCL 1 % IJ SOLN
INTRAMUSCULAR | Status: DC | PRN
  Administered 2020-11-18: 10 mL

## 2020-11-18 MED ORDER — LIDOCAINE HCL 1 % IJ SOLN
INTRAMUSCULAR | Status: AC
  Filled 2020-11-18: qty 20

## 2020-11-18 NOTE — Procedures (Signed)
PROCEDURE SUMMARY:  Successful US guided paracentesis from left abdomen.  Yielded 4.2 L of light brown fluid.  No immediate complications.  Pt tolerated well.   EBL < 1 mL  Theresa Duty, NP 11/18/2020 3:36 PM

## 2020-11-19 ENCOUNTER — Ambulatory Visit (HOSPITAL_COMMUNITY): Payer: Medicare Other

## 2020-11-21 ENCOUNTER — Telehealth: Payer: Self-pay | Admitting: Family Medicine

## 2020-11-21 DIAGNOSIS — R188 Other ascites: Secondary | ICD-10-CM

## 2020-11-21 NOTE — Telephone Encounter (Signed)
Copied from [place] [id number]. Topic: General - Other >> Nov 18, 2020  3:04 PM Antonieta Iba C wrote: Reason for CRM: pt called in requesting to have an standard order placed with [facility]. Pt says that he just had 7 lbs of fluid removed from him. Pt provided the phone number to radiology to ensure that its completed. [phone]

## 2020-11-21 NOTE — Telephone Encounter (Signed)
Done

## 2020-11-21 NOTE — Telephone Encounter (Signed)
Pt called and a vm was left informing patient that orders has been placed.

## 2020-11-21 NOTE — Telephone Encounter (Signed)
Pt states that he need a standing order for Paracentesis placed so that he can call and set his appointment when needed.

## 2020-11-25 IMAGING — DX DG CHEST 1V PORT
1 series · 1 of 1 positions shown · non-contrast
Comparison: 05/20/2019

CLINICAL DATA: Shortness of breath. Abdominal pain. Smoker.

EXAM:
PORTABLE CHEST 1 VIEW

[chest ap]
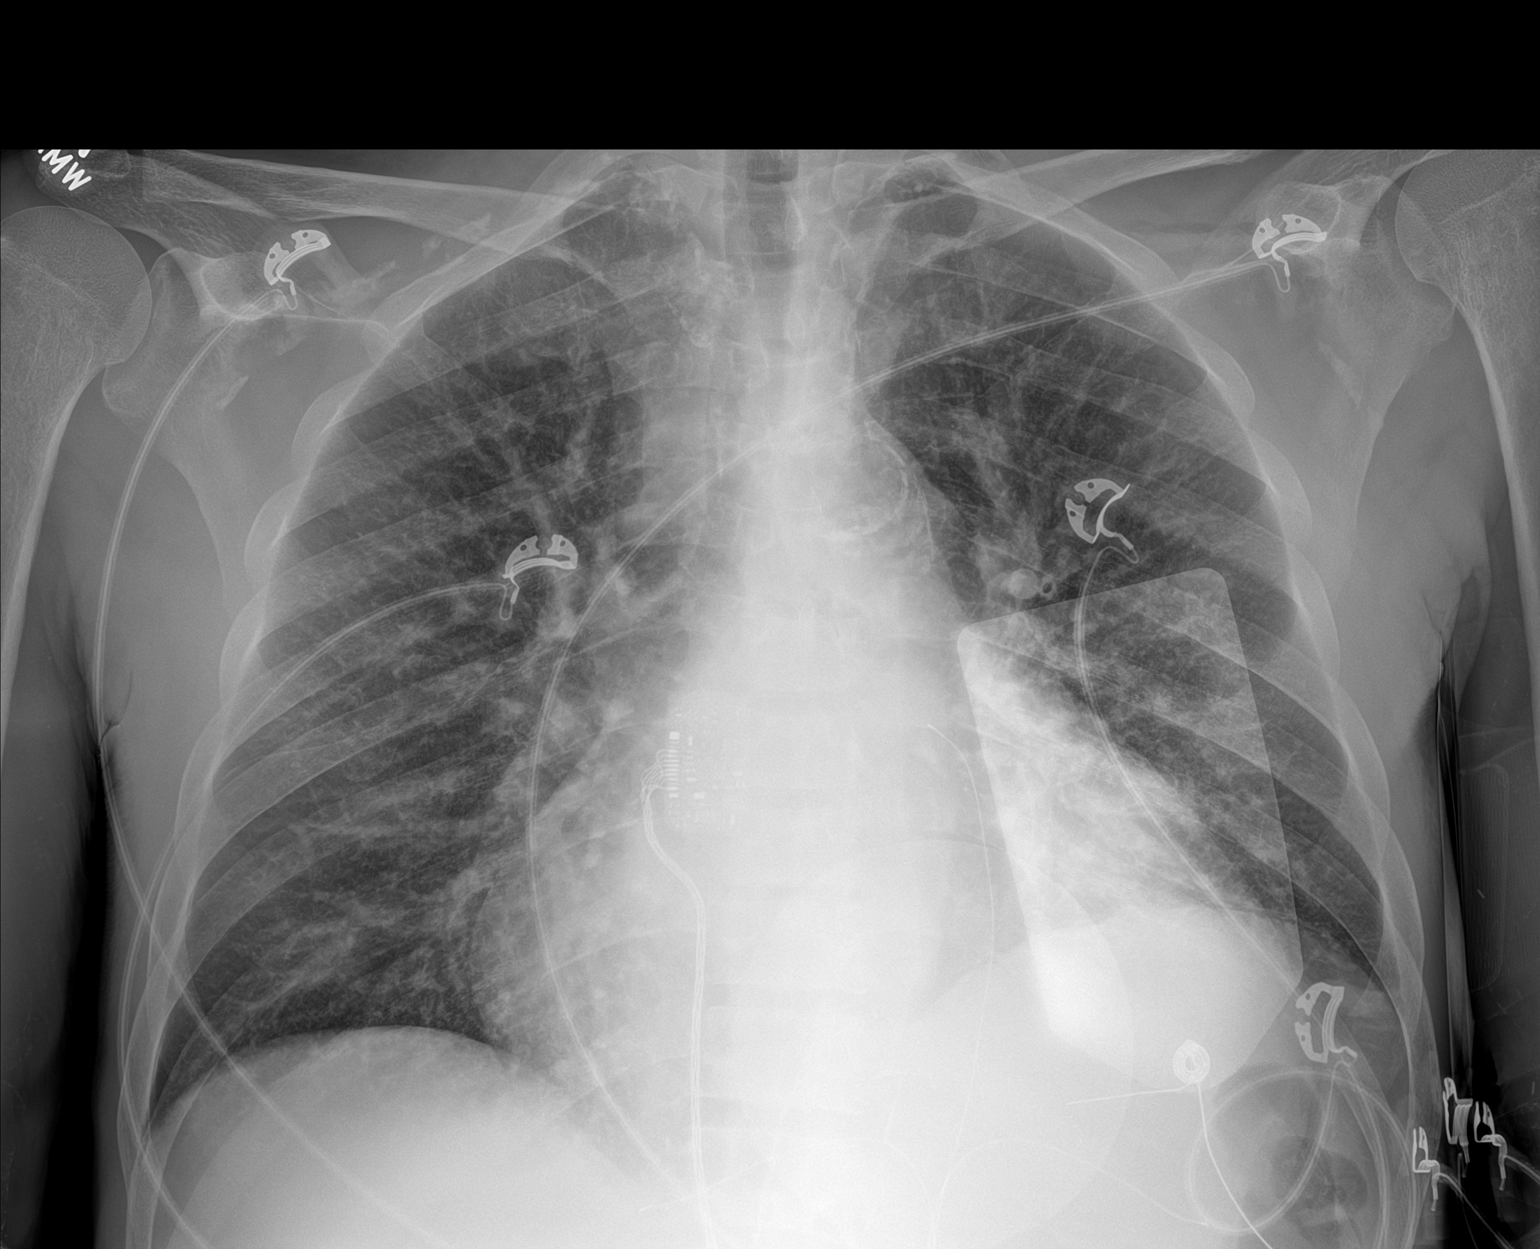

[1 of 1 positions shown; findings below may reference images not displayed]

FINDINGS: Stable enlarged cardiac silhouette and tortuous and calcified
thoracic aorta. Stable mild elevation of the left hemidiaphragm and
moderate peribronchial thickening. The lungs remain clear, more
difficult to assess at the left lung base due to an overlying patch.
Unremarkable bones.
IMPRESSION: 1. No acute abnormality.
2. Stable cardiomegaly and moderate bronchitic changes.

## 2020-11-27 IMAGING — DX DG CHEST 1V PORT
1 series · 1 of 1 positions shown · non-contrast
Comparison: 05/27/2019 chest radiograph.

CLINICAL DATA: Dyspnea

EXAM:
PORTABLE CHEST 1 VIEW

[chest]
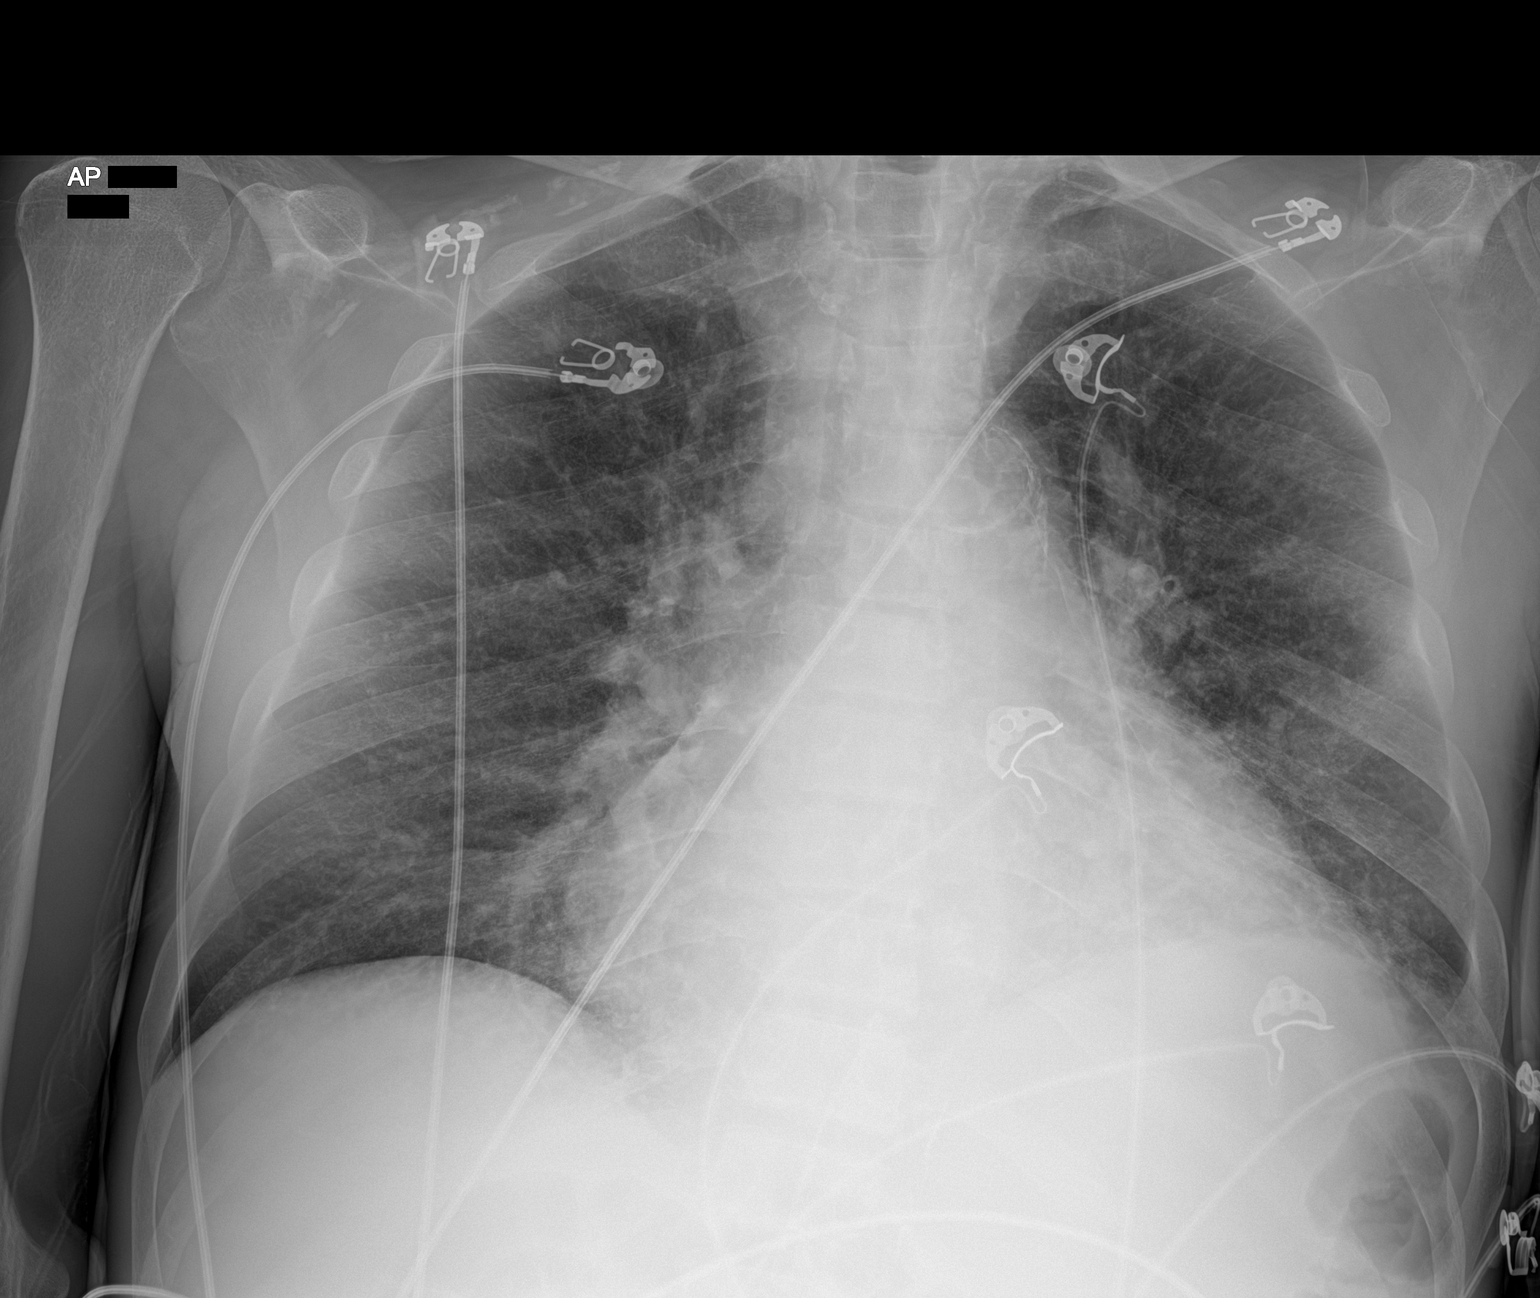

[1 of 1 positions shown; findings below may reference images not displayed]

FINDINGS: Stable cardiomediastinal silhouette with moderate cardiomegaly. No
pneumothorax. No pleural effusion. Mild pulmonary edema is slightly
improved. Mild left lung base atelectasis.
IMPRESSION: 1. Moderate cardiomegaly.  Mild pulmonary edema, slightly improved.
2. Stable mild left lung base atelectasis.

## 2020-11-30 ENCOUNTER — Other Ambulatory Visit: Payer: Self-pay | Admitting: Critical Care Medicine

## 2020-11-30 ENCOUNTER — Other Ambulatory Visit: Payer: Self-pay | Admitting: Student

## 2020-11-30 DIAGNOSIS — K219 Gastro-esophageal reflux disease without esophagitis: Secondary | ICD-10-CM

## 2020-11-30 DIAGNOSIS — I2781 Cor pulmonale (chronic): Secondary | ICD-10-CM

## 2020-11-30 DIAGNOSIS — R110 Nausea: Secondary | ICD-10-CM

## 2020-11-30 IMAGING — DX DG CHEST 1V PORT
1 series · 1 of 1 positions shown · non-contrast
Comparison: 05/29/2019 CT 05/07/2019

CLINICAL DATA: Shortness of breath.

EXAM:
PORTABLE CHEST 1 VIEW

[chest ap]
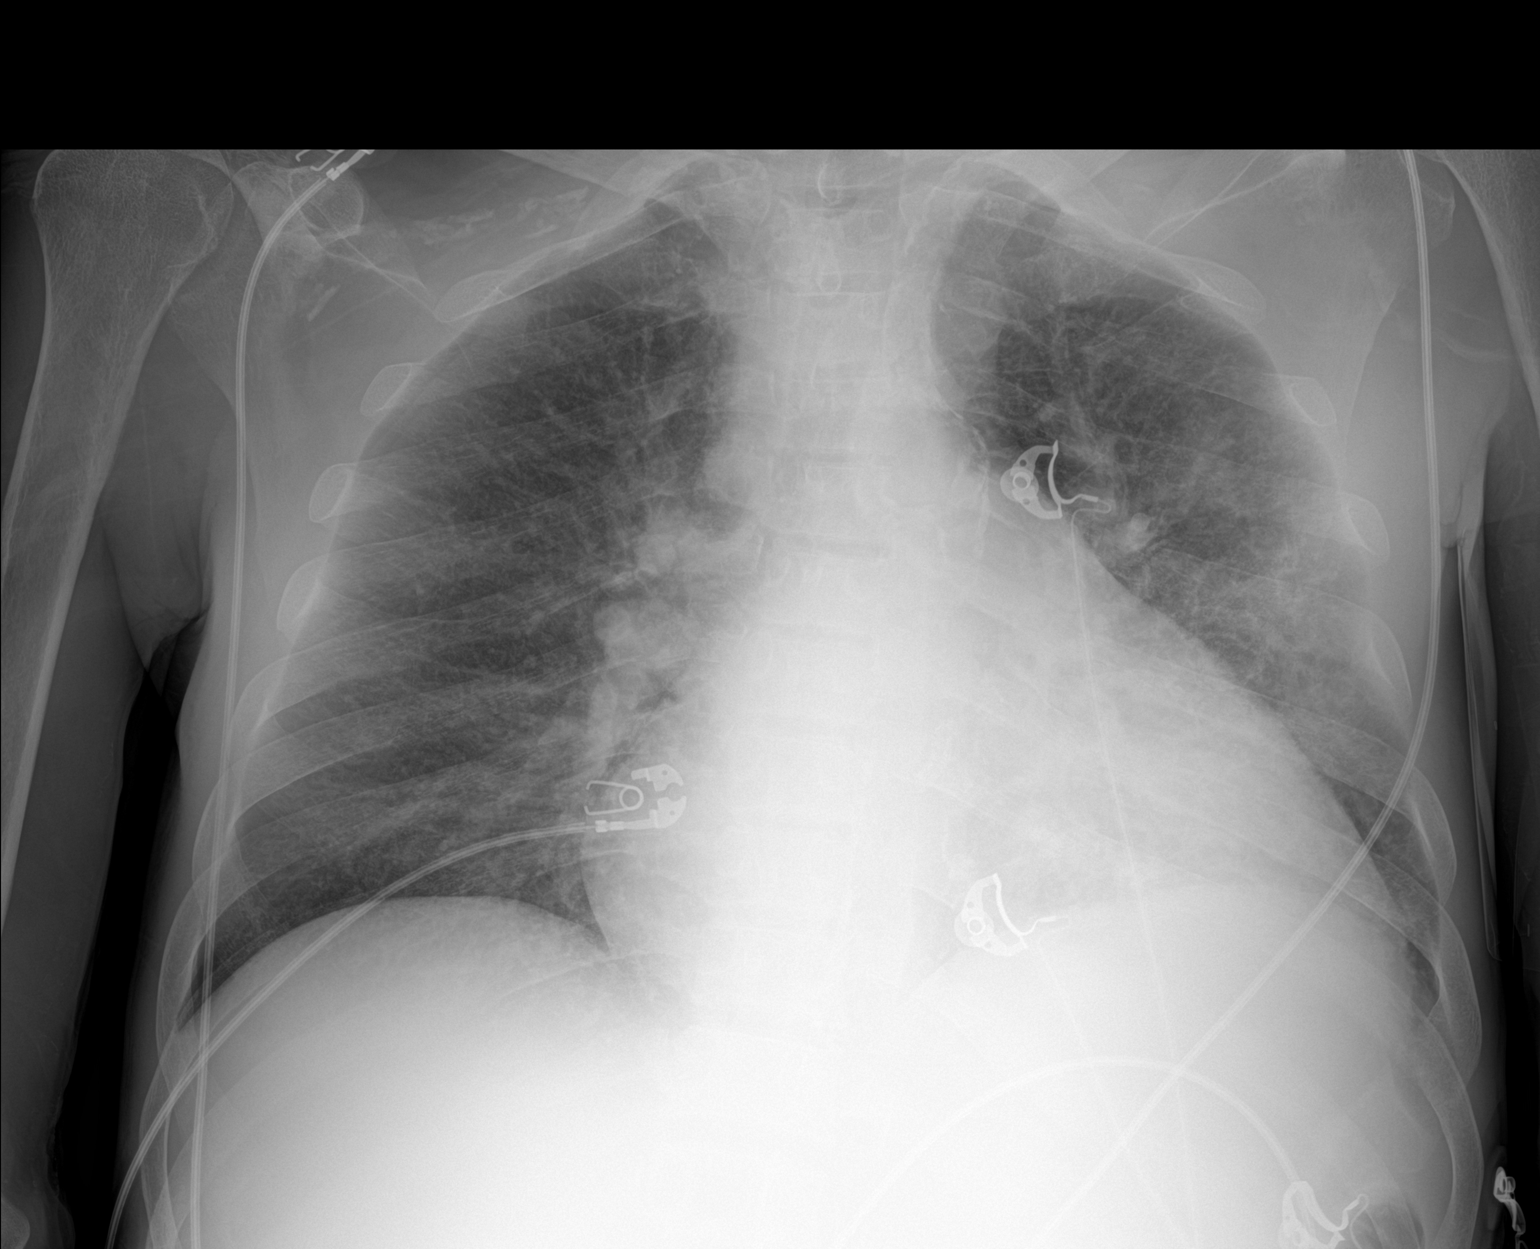

[1 of 1 positions shown; findings below may reference images not displayed]

FINDINGS: Unchanged cardiomegaly. Worsening pulmonary edema from prior exam.
Question of developing more focal airspace opacity in the left mid
lung. Skin folds project over the right hemithorax. No pneumothorax.
No significant pleural fluid. Aortic atherosclerosis.
IMPRESSION: 1. Worsening pulmonary edema from radiographs 3 days ago.
2. Question of developing more focal airspace opacity in the left
mid lung which may represent vascular confluence or infection.
3. Unchanged cardiomegaly.
4.  Aortic Atherosclerosis ([id number]).

## 2020-12-01 IMAGING — DX DG CHEST 1V PORT
2 series · 2 of 2 positions shown · non-contrast
Comparison: 06/01/2019

CLINICAL DATA: Chest pain, shortness of breath

EXAM:
PORTABLE CHEST 1 VIEW

[chest ap (1 of 2)]
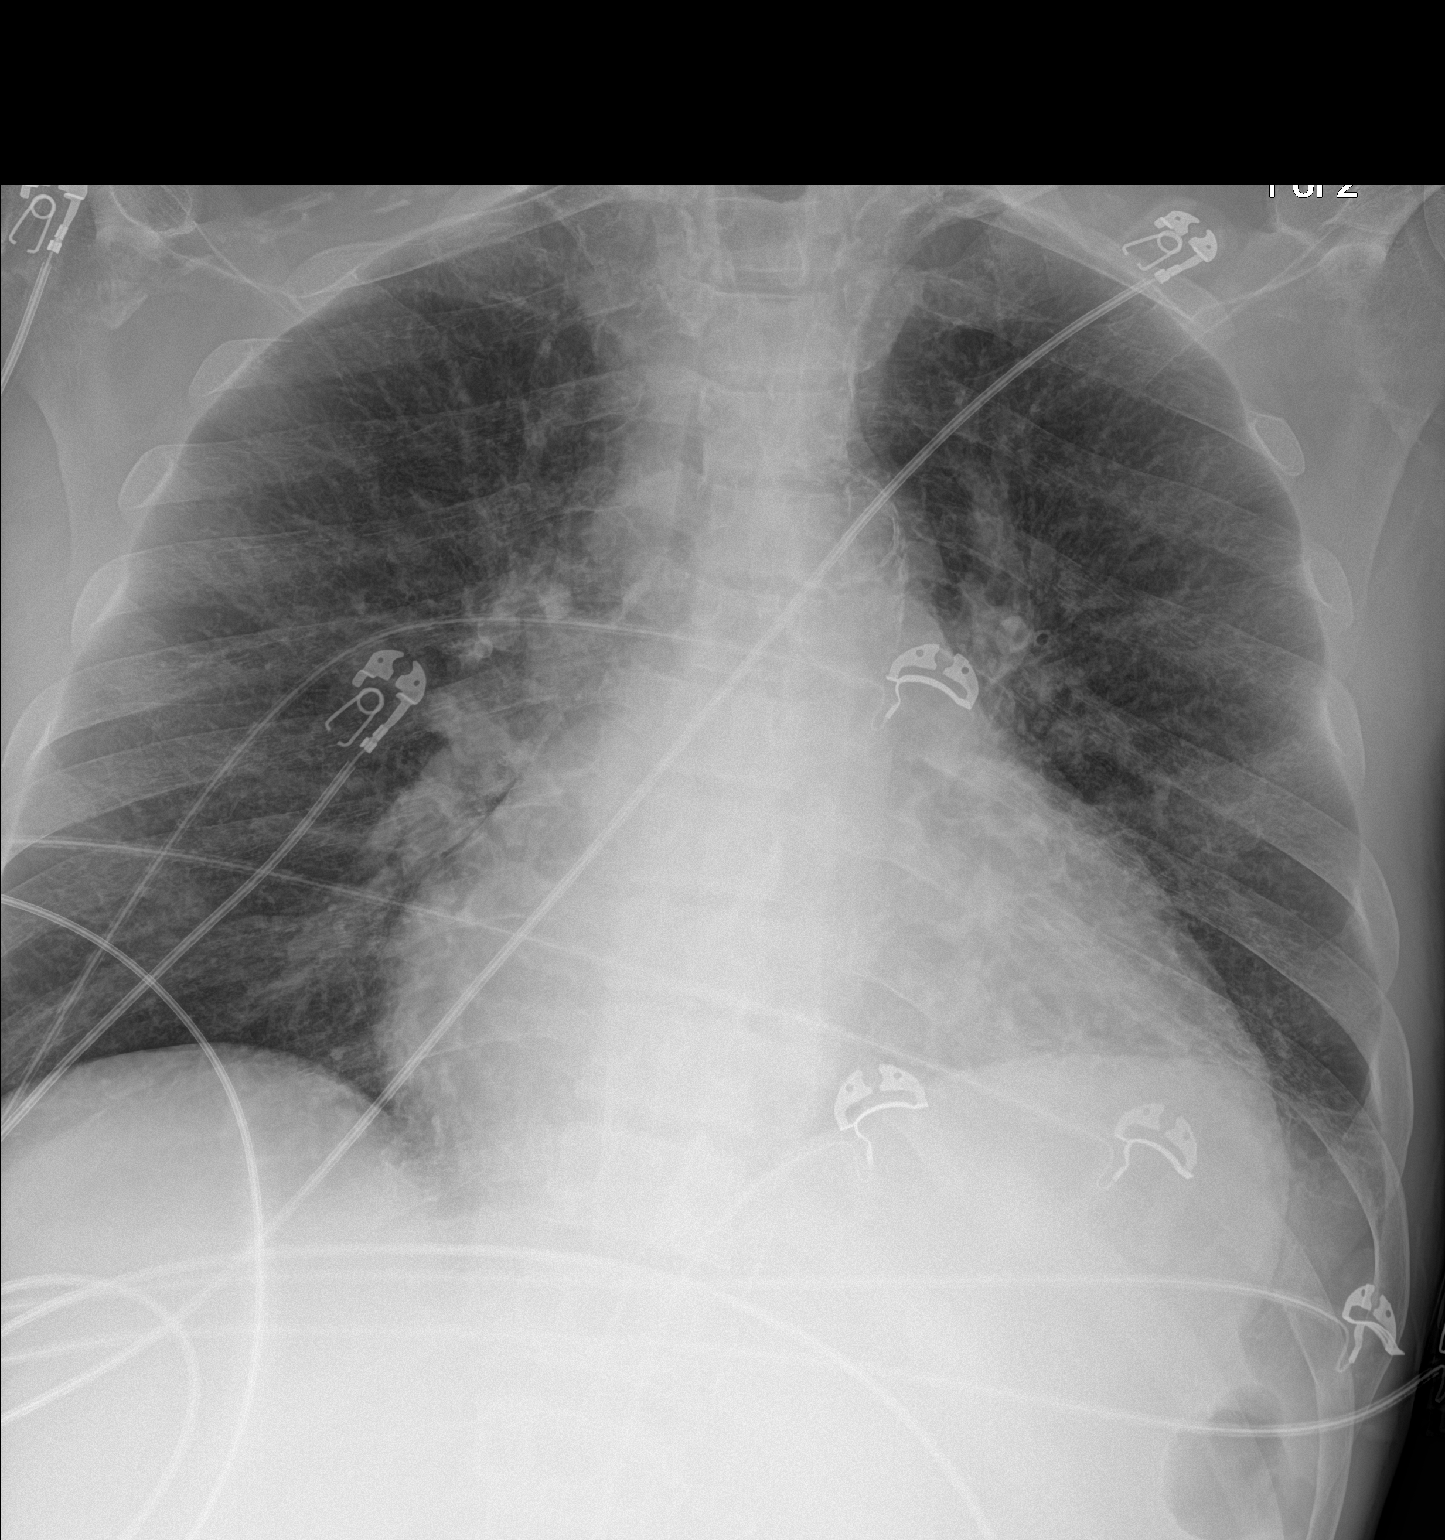

[chest ap (2 of 2)]
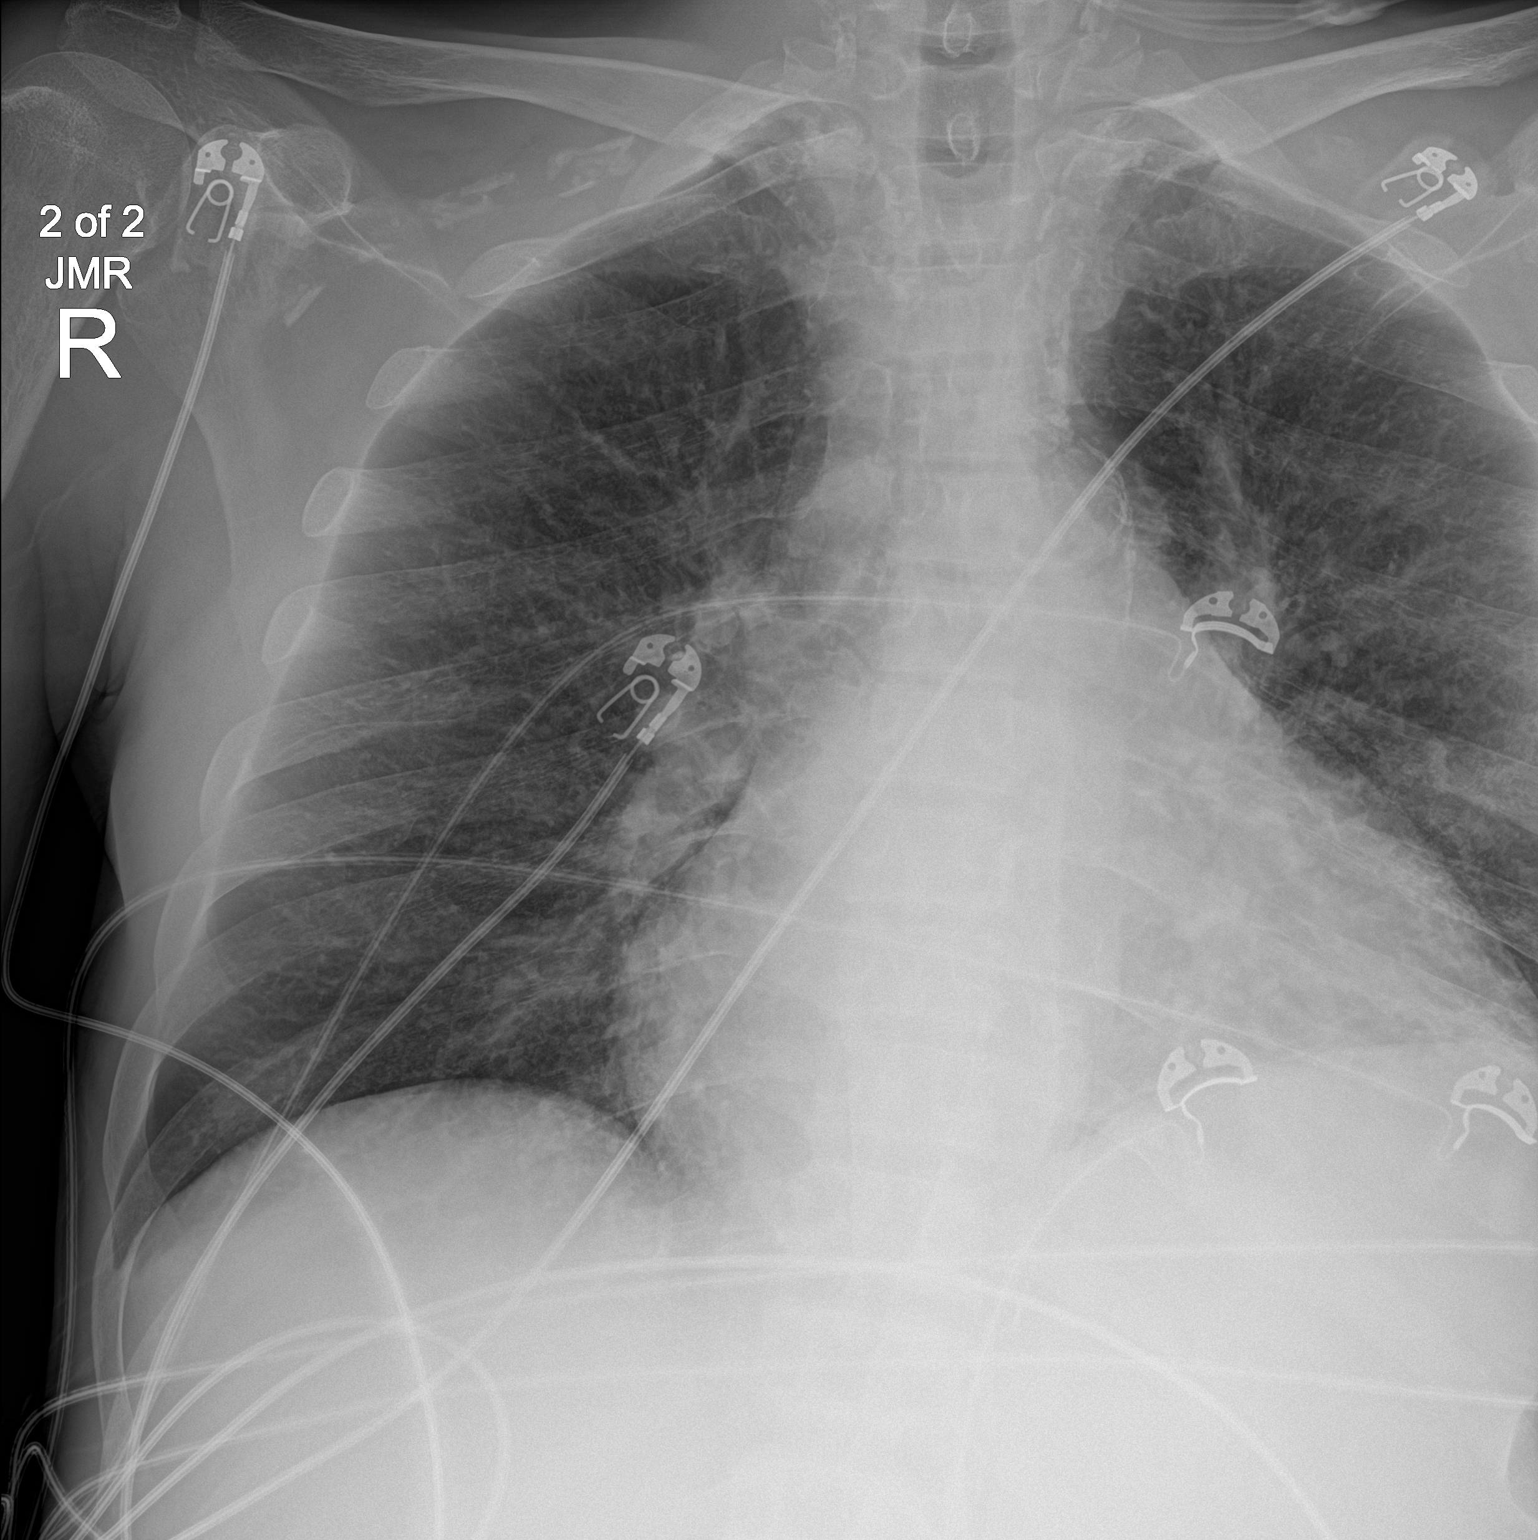

[2 of 2 positions shown; findings below may reference images not displayed]

FINDINGS: Stable cardiomegaly. Persistent pulmonary vascular congestion with
interstitial edema. Previously seen developing opacity in the left
mid lung is less conspicuous compared to prior exam. No new areas of
focal airspace consolidation. No pleural effusion. No pneumothorax.
IMPRESSION: 1. Stable cardiomegaly with pulmonary vascular congestion and
interstitial edema.
2. Previously seen developing opacity in the left mid lung is less
conspicuous compared to prior exam.

## 2020-12-01 NOTE — Telephone Encounter (Signed)
Requested medication (s) are due for refill today: Yes  Requested medication (s) are on the active medication list: Yes  Last refill:  10/21/20  Future visit scheduled: Yes  Notes to clinic:  See request.    Requested Prescriptions  Pending Prescriptions Disp Refills   ondansetron (ZOFRAN) 4 MG tablet [Pharmacy Med Name: ONDANSETRON HCL 4 MG TABLET] 30 tablet 1    Sig: TAKE 1 TABLET BY MOUTH EVERY 8 HOURS AS NEEDED FOR NAUSEA AND VOMITING      Not Delegated - Gastroenterology: Antiemetics Failed - 11/30/2020  6:59 PM      Failed - This refill cannot be delegated      Passed - Valid encounter within last 6 months    Recent Outpatient Visits           3 months ago Felon of finger of right hand   [place], Charlane Ferretti, MD   4 months ago Gross hematuria   [place], Charlane Ferretti, MD   5 months ago AF (paroxysmal atrial fibrillation) [facility])   [place], Enobong, MD       Future Appointments             In 1 week Charlott Rakes, MD [place]   In 1 month [place], [place], [place] [facility]

## 2020-12-02 ENCOUNTER — Other Ambulatory Visit: Payer: Self-pay | Admitting: Student

## 2020-12-02 ENCOUNTER — Ambulatory Visit (HOSPITAL_COMMUNITY)
Admission: RE | Admit: 2020-12-02 | Discharge: 2020-12-02 | Disposition: A | Discharge status: Home / Self Care | Payer: Medicare Other | Source: Ambulatory Visit | Attending: Family Medicine | Admitting: Family Medicine

## 2020-12-02 ENCOUNTER — Other Ambulatory Visit: Payer: Self-pay

## 2020-12-02 DIAGNOSIS — I2781 Cor pulmonale (chronic): Secondary | ICD-10-CM

## 2020-12-02 DIAGNOSIS — R188 Other ascites: Secondary | ICD-10-CM

## 2020-12-02 HISTORY — PX: IR PARACENTESIS: IMG2679

## 2020-12-02 MED ORDER — LIDOCAINE HCL (PF) 1 % IJ SOLN
INTRAMUSCULAR | Status: AC
  Filled 2020-12-02: qty 30

## 2020-12-02 MED ORDER — LIDOCAINE HCL (PF) 1 % IJ SOLN
INTRAMUSCULAR | Status: AC | PRN
  Administered 2020-12-02: 10 mL

## 2020-12-02 NOTE — Telephone Encounter (Signed)
Should I send this refill?

## 2020-12-02 NOTE — Procedures (Signed)
Ultrasound-guided therapeutic paracentesis performed yielding 3.8 liters of blood-tinged  fluid. No immediate complications.EBL none.   

## 2020-12-02 NOTE — Telephone Encounter (Signed)
Do not refill. Advise him to follow up with PCP to determine if it is still necessary since discharge from the hospital.

## 2020-12-03 NOTE — Telephone Encounter (Signed)
Called and informed pt, pt will be making an appointment with his PCP.

## 2020-12-07 IMAGING — DX DG CHEST 1V PORT
1 series · 1 of 1 positions shown · non-contrast
Comparison: Chest radiograph 06/07/2019

CLINICAL DATA: Patient with abdominal distension.

EXAM:
PORTABLE CHEST 1 VIEW

[chest ap]
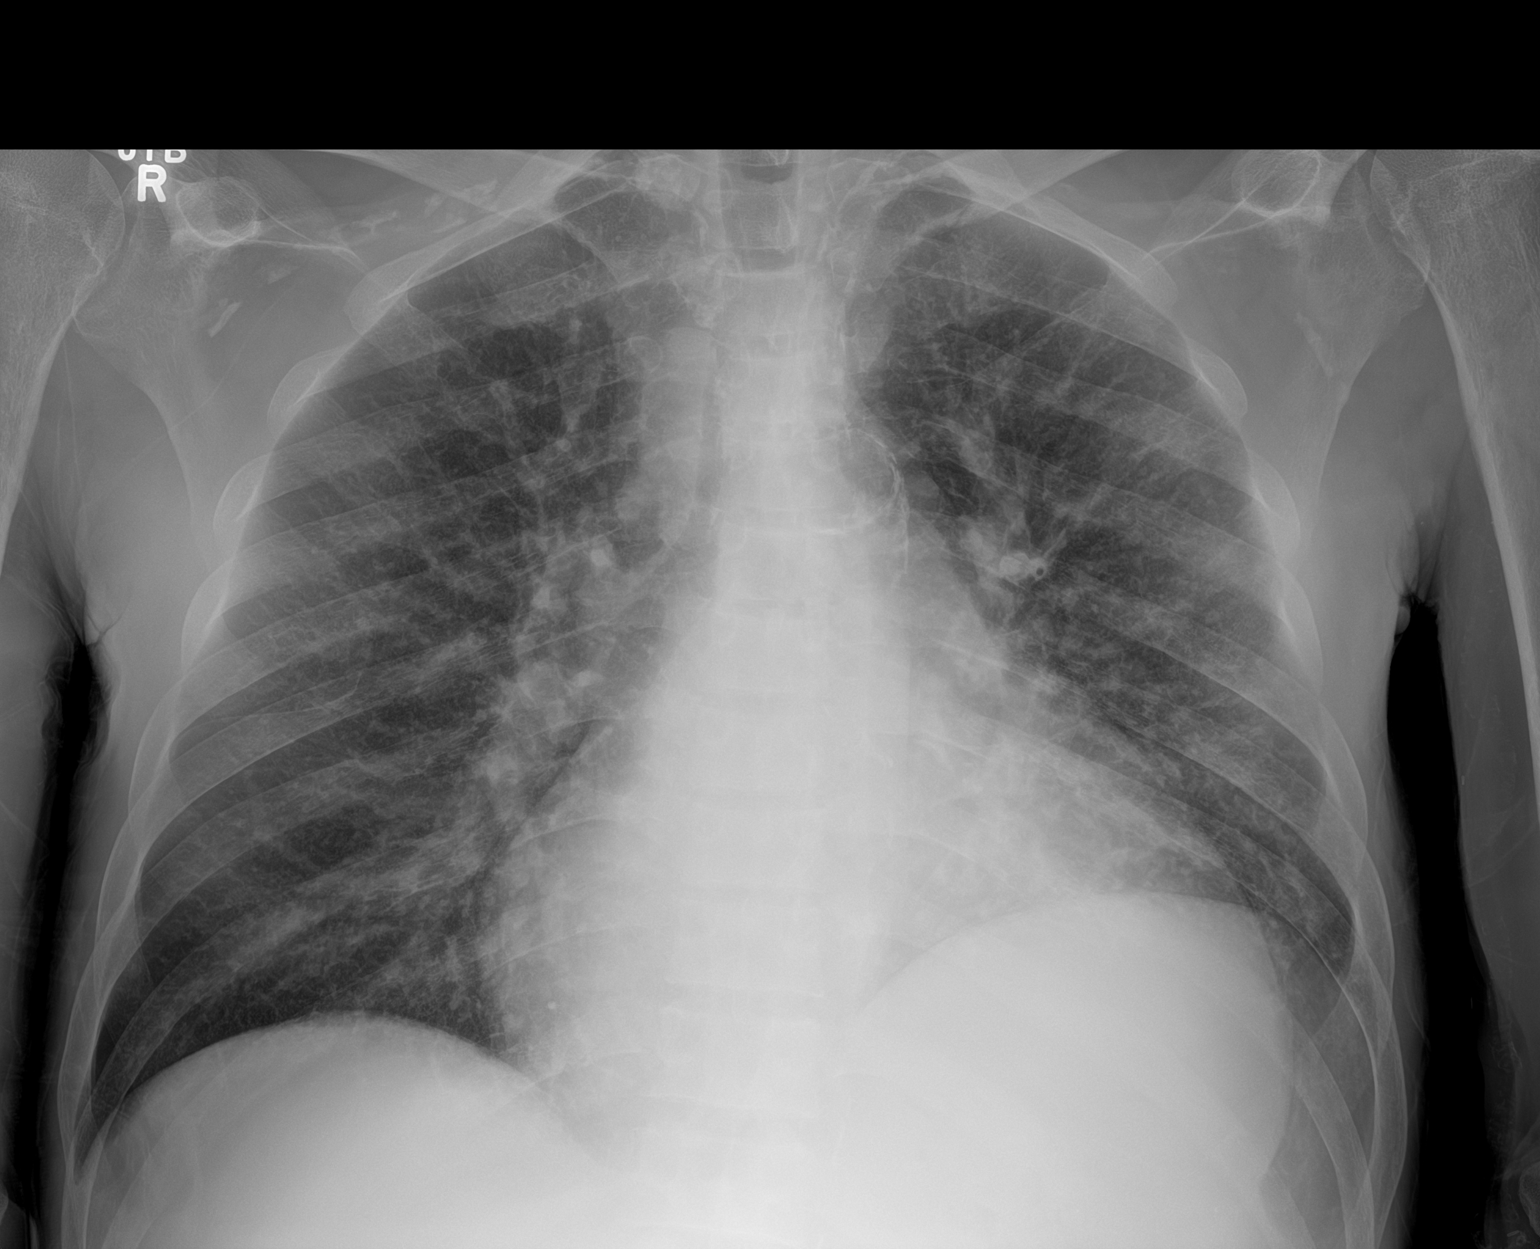

[1 of 1 positions shown; findings below may reference images not displayed]

FINDINGS: Stable cardiomegaly. No large area of pulmonary consolidation. No
pleural effusion or pneumothorax. Regional skeleton is unremarkable.
IMPRESSION: No acute cardiopulmonary process.  Cardiomegaly.

## 2020-12-10 ENCOUNTER — Other Ambulatory Visit: Payer: Self-pay

## 2020-12-10 ENCOUNTER — Encounter: Payer: Self-pay | Admitting: Family Medicine

## 2020-12-10 ENCOUNTER — Ambulatory Visit: Payer: Medicare Other | Attending: Family Medicine | Admitting: Family Medicine

## 2020-12-10 VITALS — BP 100/60 | HR 79 | Ht 69.0 in | Wt 156.8 lb

## 2020-12-10 DIAGNOSIS — K7460 Unspecified cirrhosis of liver: Secondary | ICD-10-CM | POA: Diagnosis not present

## 2020-12-10 DIAGNOSIS — R188 Other ascites: Secondary | ICD-10-CM

## 2020-12-10 DIAGNOSIS — Z992 Dependence on renal dialysis: Secondary | ICD-10-CM

## 2020-12-10 DIAGNOSIS — N186 End stage renal disease: Secondary | ICD-10-CM | POA: Diagnosis not present

## 2020-12-10 DIAGNOSIS — R29898 Other symptoms and signs involving the musculoskeletal system: Secondary | ICD-10-CM | POA: Diagnosis not present

## 2020-12-10 NOTE — Progress Notes (Signed)
Subjective:  Patient ID: [name], male    DOB: [birth year]  Age: 60 y.o. MRN: [id number]  CC: No chief complaint on file.   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  Interval History: Last seen by the hand surgeon Dr. Fredna Dow on 12/03/2020 and per notes he is doing well with his wound dressings in place. He complains of his knees buckling and he has been falling due to weakness.  He does have a cane but does not ambulate with a cane.  Denies presence of pain in his lower extremities.  With regards to his liver cirrhosis he is currently not under the care of GI his next paracentesis comes up tomorrow as his abdomen is distended. His hemodialysis sessions are going well. Past Medical History:  Diagnosis Date  . Anemia   . Anxiety   . Arthritis    left shoulder  . Atherosclerosis of aorta ([place])   . Cardiomegaly   . Chest pain    DATE UNKNOWN, C/O PERIODICALLY  . Cocaine abuse ([place])   . COPD exacerbation ([place]) 08/17/2016  . Coronary artery disease    stent 02/22/17  . Dysrhythmia   . ESRD (end stage renal disease) on dialysis ([place])    "[name]; M-W-F" (07/04/2017)  . GERD (gastroesophageal reflux disease)    DATE UNKNOWN  . Hemorrhoids   . Hepatitis B, chronic ([place])   . Hepatitis C   . History of kidney stones   . Hyperkalemia   . Hypertension   . Kidney failure   . Metabolic bone disease    Patient denies  . Mitral stenosis   . Myocardial infarction ([place])   . Pneumonia   . Pulmonary edema   . Solitary rectal ulcer syndrome 07/2017   at flex sig for rectal bleeding  . Tubular adenoma of colon     Past Surgical History:  Procedure Laterality Date  . A/V FISTULAGRAM Left  05/26/2017   Procedure: A/V FISTULAGRAM;  Surgeon: Conrad Annandale, MD;  Location: [place] [facility];  Service: Cardiovascular;  Laterality: Left;  . A/V FISTULAGRAM Right 11/18/2017   Procedure: A/V FISTULAGRAM - Right Arm;  Surgeon: Elam Dutch, MD;  Location: [place] [facility];  Service: Cardiovascular;  Laterality: Right;  . AMPUTATION Right 09/04/2020   Procedure: RIGHT INDEX AND RIGHT RING FINGER AMPUTATION DIGIT;  Surgeon: Leanora Cover, MD;  Location: [place];  Service: Orthopedics;  Laterality: Right;  . AMPUTATION Right 11/13/2020   Procedure: RIGHT INDEX FINGER AMPUTATION AND RIGHT RING FINGER AMPUTATION;  Surgeon: Leanora Cover, MD;  Location: [place];  Service: Orthopedics;  Laterality: Right;  . APPLICATION OF WOUND VAC Left 06/14/2017   Procedure: APPLICATION OF WOUND VAC;  Surgeon: Katha Cabal, MD;  Location: [facility];  Service: Vascular;  Laterality: Left;  . AV FISTULA PLACEMENT  2012   BELIEVED WAS PLACED IN JUNE  . AV FISTULA PLACEMENT Right 08/09/2017   Procedure: Creation Right arm ARTERIOVENOUS BRACHIOCEPOHALIC FISTULA;  Surgeon: Elam Dutch, MD;  Location: [facility] OR;  Service: Vascular;  Laterality: Right;  . AV FISTULA PLACEMENT Right 11/22/2017   Procedure: INSERTION OF ARTERIOVENOUS (AV) GORE-TEX GRAFT RIGHT UPPER ARM;  Surgeon: Elam Dutch, MD;  Location: [facility]  OR;  Service: Vascular;  Laterality: Right;  . BIOPSY  01/25/2018   Procedure: BIOPSY;  Surgeon: Jerene Bears, MD;  Location: [place];  Service: Gastroenterology;;  . BIOPSY  04/10/2019   Procedure: BIOPSY;  Surgeon: Jerene Bears, MD;  Location: [facility];  Service: Gastroenterology;;  . COLONOSCOPY    . COLONOSCOPY WITH PROPOFOL N/A 01/25/2018   Procedure: COLONOSCOPY WITH PROPOFOL;  Surgeon: Jerene Bears, MD;  Location: [place];  Service: Gastroenterology;  Laterality: N/A;  . CORONARY STENT INTERVENTION N/A 02/22/2017   Procedure: CORONARY STENT INTERVENTION;  Surgeon: Nigel Mormon, MD;  Location: [place] [facility];  Service: Cardiovascular;  Laterality: N/A;  . ESOPHAGOGASTRODUODENOSCOPY (EGD) WITH PROPOFOL N/A 01/25/2018   Procedure: ESOPHAGOGASTRODUODENOSCOPY (EGD) WITH PROPOFOL;  Surgeon: Jerene Bears, MD;  Location[place];  Service: Gastroenterology;  Laterality: N/A;  . ESOPHAGOGASTRODUODENOSCOPY (EGD) WITH PROPOFOL N/A 04/10/2019   Procedure: ESOPHAGOGASTRODUODENOSCOPY (EGD) WITH PROPOFOL;  Surgeon: Jerene Bears, MD;  [facility];  Service: Gastroenterology;  Laterality: N/A;  . FLEXIBLE SIGMOIDOSCOPY N/A 07/15/2017   Procedure: FLEXIBLE SIGMOIDOSCOPY;  Surgeon: Carol Ada, MD;  Location: [place];  Service: Endoscopy;  Laterality: N/A;  . HEMORRHOID BANDING    . I & D EXTREMITY Left 06/01/2017   Procedure: IRRIGATION AND DEBRIDEMENT LEFT ARM HEMATOMA WITH LIGATION OF LEFT ARM AV FISTULA;  Surgeon: Elam Dutch, MD;  Location: [place];  Service: Vascular;  Laterality: Left;  . I & D EXTREMITY Left 06/14/2017   Procedure: IRRIGATION AND DEBRIDEMENT EXTREMITY;  Surgeon: Katha Cabal, MD;  Location: [facility];  Service: Vascular;  Laterality: Left;  . INSERTION OF DIALYSIS CATHETER  05/30/2017  . INSERTION OF DIALYSIS CATHETER N/A 05/30/2017   Procedure: INSERTION OF DIALYSIS CATHETER;  Surgeon: Elam Dutch, MD;  Location: [place];  Service: Vascular;  Laterality: N/A;  . IR PARACENTESIS  08/30/2017  . IR PARACENTESIS  09/29/2017  . IR PARACENTESIS  10/28/2017  . IR PARACENTESIS  11/09/2017  . IR PARACENTESIS  11/16/2017  . IR PARACENTESIS  11/28/2017  . IR PARACENTESIS  12/01/2017  . IR PARACENTESIS  12/06/2017  . IR PARACENTESIS  01/03/2018  . IR PARACENTESIS  01/23/2018  . IR PARACENTESIS  02/07/2018  . IR PARACENTESIS  02/21/2018  . IR PARACENTESIS  03/06/2018  . IR PARACENTESIS  03/17/2018  . IR PARACENTESIS  04/04/2018  . IR PARACENTESIS  12/28/2018  . IR PARACENTESIS  01/08/2019  . IR PARACENTESIS  01/23/2019  . IR PARACENTESIS   02/01/2019  . IR PARACENTESIS  02/19/2019  . IR PARACENTESIS  03/01/2019  . IR PARACENTESIS  03/15/2019  . IR PARACENTESIS  04/03/2019  . IR PARACENTESIS  04/12/2019  . IR PARACENTESIS  05/01/2019  . IR PARACENTESIS  05/08/2019  . IR PARACENTESIS  05/24/2019  . IR PARACENTESIS  06/12/2019  . IR PARACENTESIS  07/09/2019  . IR PARACENTESIS  07/27/2019  . IR PARACENTESIS  08/09/2019  . IR PARACENTESIS  08/21/2019  . IR PARACENTESIS  09/17/2019  . IR PARACENTESIS  10/05/2019  . IR PARACENTESIS  10/29/2019  . IR PARACENTESIS  11/08/2019  . IR PARACENTESIS  12/12/2019  . IR PARACENTESIS  01/03/2020  . IR PARACENTESIS  01/10/2020  . IR PARACENTESIS  01/17/2020  . IR PARACENTESIS  01/24/2020  . IR PARACENTESIS  01/31/2020  . IR PARACENTESIS  02/07/2020  . IR PARACENTESIS  02/21/2020  . IR PARACENTESIS  05/21/2020  . IR PARACENTESIS  10/10/2020  .  IR PARACENTESIS  10/28/2020  . IR PARACENTESIS  11/18/2020  . IR PARACENTESIS  12/02/2020  . IR RADIOLOGIST EVAL & MGMT  02/14/2018  . IR RADIOLOGIST EVAL & MGMT  02/22/2019  . LEFT HEART CATH AND CORONARY ANGIOGRAPHY N/A 02/22/2017   Procedure: LEFT HEART CATH AND CORONARY ANGIOGRAPHY;  Surgeon: Nigel Mormon, MD;  Location: [place] [facility];  Service: Cardiovascular;  Laterality: N/A;  . LIGATION OF ARTERIOVENOUS  FISTULA Left 02/11/9936   Procedure: Plication of Left Arm Arteriovenous Fistula;  Surgeon: Elam Dutch, MD;  Location: [place];  Service: Vascular;  Laterality: Left;  . POLYPECTOMY    . POLYPECTOMY  01/25/2018   Procedure: POLYPECTOMY;  Surgeon: Jerene Bears, MD;  Location: [place];  Service: Gastroenterology;;  . REVISON OF ARTERIOVENOUS FISTULA Left 1/69/6789   Procedure: PLICATION OF DISTAL ANEURYSMAL SEGEMENT OF LEFT UPPER ARM ARTERIOVENOUS FISTULA;  Surgeon: Elam Dutch, MD;  Location: [place];  Service: Vascular;  Laterality: Left;  . REVISON OF ARTERIOVENOUS FISTULA Left 3/81/0175   Procedure: Plication of Left Upper Arm Fistula ;   Surgeon: Waynetta Sandy, MD;  Location: [place];  Service: Vascular;  Laterality: Left;  . SKIN GRAFT SPLIT THICKNESS LEG / FOOT Left    SKIN GRAFT SPLIT THICKNESS LEFT ARM DONOR SITE: LEFT ANTERIOR THIGH  . SKIN SPLIT GRAFT Left 07/04/2017   Procedure: SKIN GRAFT SPLIT THICKNESS LEFT ARM DONOR SITE: LEFT ANTERIOR THIGH;  Surgeon: Elam Dutch, MD;  Location: [place];  Service: Vascular;  Laterality: Left;  . THROMBECTOMY W/ EMBOLECTOMY Left 06/05/2017   Procedure: EXPLORATION OF LEFT ARM FOR BLEEDING; OVERSEWED PROXIMAL FISTULA;  Surgeon: Angelia Mould, MD;  Location: [place];  Service: Vascular;  Laterality: Left;  . WOUND EXPLORATION Left 06/03/2017   Procedure: WOUND EXPLORATION WITH WOUND VAC APPLICATION TO LEFT ARM;  Surgeon: Angelia Mould, MD;  Location: [facility] OR;  Service: Vascular;  Laterality: Left;    Family History  Problem Relation Age of Onset  . Heart disease Mother   . Lung cancer Mother   . Heart disease Father   . Malignant hyperthermia Father   . COPD Father   . Throat cancer Sister   . Esophageal cancer Sister   . Hypertension Other   . COPD Other   . Throat cancer Sister   . Colon cancer Neg Hx   . Colon polyps Neg Hx   . Rectal cancer Neg Hx   . Stomach cancer Neg Hx     Allergies  Allergen Reactions  . Tramadol Itching and Other (See Comments)    Other reaction(s): Unknown  . Grass Extracts [Gramineae Pollens]     Other reaction(s): Sneezing  . Morphine And Related Other (See Comments)    Stomach pain  . Pollen Extract Other (See Comments)    Other reaction(s): Sneezing (finding)  . Acetaminophen Nausea Only    Stomach ache   . Aspirin Itching and Other (See Comments)    STOMACH PAIN  Other reaction(s): Unknown  . Clonidine Derivatives Itching    Outpatient Medications Prior to Visit  Medication Sig Dispense Refill  . acetaminophen (TYLENOL) 500 MG tablet Take 500-1,000 mg by mouth every 6 (six) hours as needed (pain.).     Marland Kitchen albuterol (VENTOLIN HFA) 108 (90 Base) MCG/ACT inhaler INHALE 2 PUFFS INTO THE LUNGS EVERY 4 HOURS AS NEEDED FOR WHEEZING OR SHORTNESS OF BREATH. 8.5 each 0  . amiodarone (PACERONE) 200 MG tablet TAKE 1 TABLET  BY MOUTH EVERY DAY (Patient taking differently: Take 200 mg by mouth in the morning.) 90 tablet 1  . cetirizine (ZYRTEC) 10 MG tablet Take 10 mg by mouth daily as needed for allergies.    . cinacalcet (SENSIPAR) 30 MG tablet TAKE 2 TABLETS (60 MG TOTAL) BY MOUTH EVERY MONDAY, WEDNESDAY, AND FRIDAY WITH HEMODIALYSIS. 60 tablet 0  . diltiazem (CARDIZEM CD) 120 MG 24 hr capsule Take 1 capsule (120 mg total) by mouth daily. (Patient taking differently: Take 120 mg by mouth in the morning.) 90 capsule 3  . doxycycline (VIBRAMYCIN) 100 MG capsule Take 100 mg by mouth 2 (two) times daily.    Marland Kitchen gabapentin (NEURONTIN) 300 MG capsule TAKE 1 CAPSULE BY MOUTH IN THE MORNING AND AT BEDTIME 60 capsule 3  . lactulose (CHRONULAC) 10 GM/15ML solution Take 30 mLs (20 g total) by mouth daily. (Patient taking differently: Take 20 g by mouth daily as needed (constipation).) 236 mL 0  . lidocaine-prilocaine (EMLA) cream Apply 1 application topically every Monday, Wednesday, and Friday with hemodialysis. Applied sparingly for port access    . metoprolol succinate (TOPROL XL) 25 MG 24 hr tablet Take 1 tablet (25 mg total) by mouth daily. (Patient taking differently: Take 25 mg by mouth in the morning.) 30 tablet 3  . ondansetron (ZOFRAN) 4 MG tablet TAKE 1 TABLET BY MOUTH EVERY 8 HOURS AS NEEDED FOR NAUSEA AND VOMITING 30 tablet 0  . oxyCODONE (ROXICODONE) 5 MG immediate release tablet 1 tab PO q6 hours prn pain 20 tablet 0  . Oxycodone HCl 10 MG TABS TAKE 1 TABLET (10 MG TOTAL) BY MOUTH THREE TIMES DAILY AS NEEDED PAIN. (Patient taking differently: Take 10 mg by mouth 3 (three) times daily as needed (pain).) 10 tablet 0  . pantoprazole (PROTONIX) 40 MG tablet Take 1 tablet (40 mg total) by mouth 2 (two) times  daily. 60 tablet 0  . polyethylene glycol (MIRALAX / GLYCOLAX) 17 g packet Take 17 g by mouth daily as needed (constipation).    . sevelamer carbonate (RENVELA) 800 MG tablet Take 2,400 mg by mouth See admin instructions. Take 3 tablets (2400 mg) by mouth with each meals and with each snack    . sulfamethoxazole-trimethoprim (BACTRIM DS) 800-160 MG tablet Take 1 tablet by mouth 2 (two) times daily.     No facility-administered medications prior to visit.     ROS Review of Systems  Constitutional: Negative for activity change and appetite change.  HENT: Negative for sinus pressure and sore throat.   Eyes: Negative for visual disturbance.  Respiratory: Negative for cough, chest tightness and shortness of breath.   Cardiovascular: Negative for chest pain and leg swelling.  Gastrointestinal: Negative for abdominal distention, abdominal pain, constipation and diarrhea.  Endocrine: Negative.   Genitourinary: Negative for dysuria.  Musculoskeletal: Positive for gait problem.       See HPI  Skin: Negative for rash.  Allergic/Immunologic: Negative.   Neurological: Negative for weakness, light-headedness and numbness.  Psychiatric/Behavioral: Negative for dysphoric mood and suicidal ideas.    Objective:  BP 100/60   Pulse 79   Ht 5\' 9"  (1.753 m)   Wt 156 lb 12.8 oz (71.1 kg)   SpO2 96%   BMI 23.16 kg/m   BP/Weight 12/10/2020 12/14/3327 11/10/8839  Systolic BP 660 630 160  Diastolic BP 60 86 72  Wt. (Lbs) 156.8 - 150  BMI 23.16 - 22.15  Some encounter information is confidential and restricted. Go to Review Flowsheets activity to  see all data.      Physical Exam Constitutional:      Appearance: He is well-developed.  Neck:     Vascular: No JVD.  Cardiovascular:     Rate and Rhythm: Normal rate.     Heart sounds: Normal heart sounds. No murmur heard.   Pulmonary:     Effort: Pulmonary effort is normal.     Breath sounds: Normal breath sounds. No wheezing or rales.  Chest:      Chest wall: No tenderness.  Abdominal:     General: Bowel sounds are normal. There is distension.     Palpations: Abdomen is soft. There is no mass.  Musculoskeletal:        General: Normal range of motion.     Right lower leg: No edema.     Left lower leg: No edema.     Comments: Amputation of fingers of right hand-right index, right ring fingers  Neurological:     Mental Status: He is alert and oriented to person, place, and time.     Comments: Strength-4+/5 in bilateral lower extremity  Psychiatric:        Mood and Affect: Mood normal.     CMP Latest Ref Rng & Units 11/17/2020 11/13/2020 09/20/2020  Glucose 70 - 99 mg/dL 97 87 126(H)  BUN 6 - 20 mg/dL 42(H) 15 17  Creatinine 0.61 - 1.24 mg/dL 9.47(H) 5.60(H) 6.52(H)  Sodium 135 - 145 mmol/L 136 134(L) 136  Potassium 3.5 - 5.1 mmol/L 4.6 3.7 4.0  Chloride 98 - 111 mmol/L 94(L) 95(L) 93(L)  CO2 22 - 32 mmol/L 27 - 27  Calcium 8.9 - 10.3 mg/dL 9.4 - 8.4(L)  Total Protein 6.5 - 8.1 g/dL 7.1 - -  Total Bilirubin 0.3 - 1.2 mg/dL 0.9 - -  Alkaline Phos 38 - 126 U/L 210(H) - -  AST 15 - 41 U/L 29 - -  ALT 0 - 44 U/L 6 - -    Lipid Panel     Component Value Date/Time   CHOL 102 09/07/2020 2340   TRIG 175 (H) 09/07/2020 2340   HDL 26 (L) 09/07/2020 2340   CHOLHDL 3.9 09/07/2020 2340   VLDL 35 09/07/2020 2340   LDLCALC 41 09/07/2020 2340    CBC    Component Value Date/Time   WBC 8.6 11/17/2020 1115   RBC 3.70 (L) 11/17/2020 1115   HGB 10.7 (L) 11/17/2020 1115   HGB 11.9 (L) 08/06/2020 1124   HCT 31.4 (L) 11/17/2020 1115   HCT 35.2 (L) 08/06/2020 1124   PLT 176 11/17/2020 1115   PLT 168 08/06/2020 1124   MCV 84.9 11/17/2020 1115   MCV 86 08/06/2020 1124   MCH 28.9 11/17/2020 1115   MCHC 34.1 11/17/2020 1115   RDW 14.5 11/17/2020 1115   RDW 17.0 (H) 08/06/2020 1124   LYMPHSABS 1.7 09/20/2020 1505   LYMPHSABS 1.6 08/06/2020 1124   MONOABS 0.8 09/20/2020 1505   EOSABS 0.2 09/20/2020 1505   EOSABS 0.3 08/06/2020 1124    BASOSABS 0.1 09/20/2020 1505   BASOSABS 0.1 08/06/2020 1124    Lab Results  Component Value Date   HGBA1C 4.8 01/26/2020    Assessment & Plan:  1. Weakness of both lower extremities Underlying myopathy could be of metabolic etiology He will benefit from strength training Advised to use his cane Fall precautions as he is at high risk of falls - Ambulatory referral to Physical Therapy  2. Cirrhosis of liver with ascites, unspecified hepatic cirrhosis  type ([place]) Scheduled for paracentesis tomorrow - Ambulatory referral to Gastroenterology  3. ESRD on dialysis [facility]) Continue hemodialysis as per schedule   Return in about 6 months (around 06/11/2021) for Chronic disease management.      Charlott Rakes, MD, FAAFP. [facility] and [place], [place]   12/10/2020, 10:44 AM

## 2020-12-10 NOTE — Progress Notes (Signed)
Has had frequent falls. Legs gets very weak.

## 2020-12-11 ENCOUNTER — Ambulatory Visit (HOSPITAL_COMMUNITY)
Admission: RE | Admit: 2020-12-11 | Discharge: 2020-12-11 | Disposition: A | Discharge status: Home / Self Care | Payer: Medicare Other | Source: Ambulatory Visit | Attending: Family Medicine | Admitting: Family Medicine

## 2020-12-11 DIAGNOSIS — R188 Other ascites: Secondary | ICD-10-CM | POA: Diagnosis present

## 2020-12-11 HISTORY — PX: IR PARACENTESIS: IMG2679

## 2020-12-11 IMAGING — US IR PARACENTESIS
1 series · 3 of 3 positions shown · non-contrast
Comparison: none

INDICATION: ascites presents for therapeutic and diagnostic paracentesis

[Series 1: (id) · 3 of 3 slices shown]
[im 1/3]
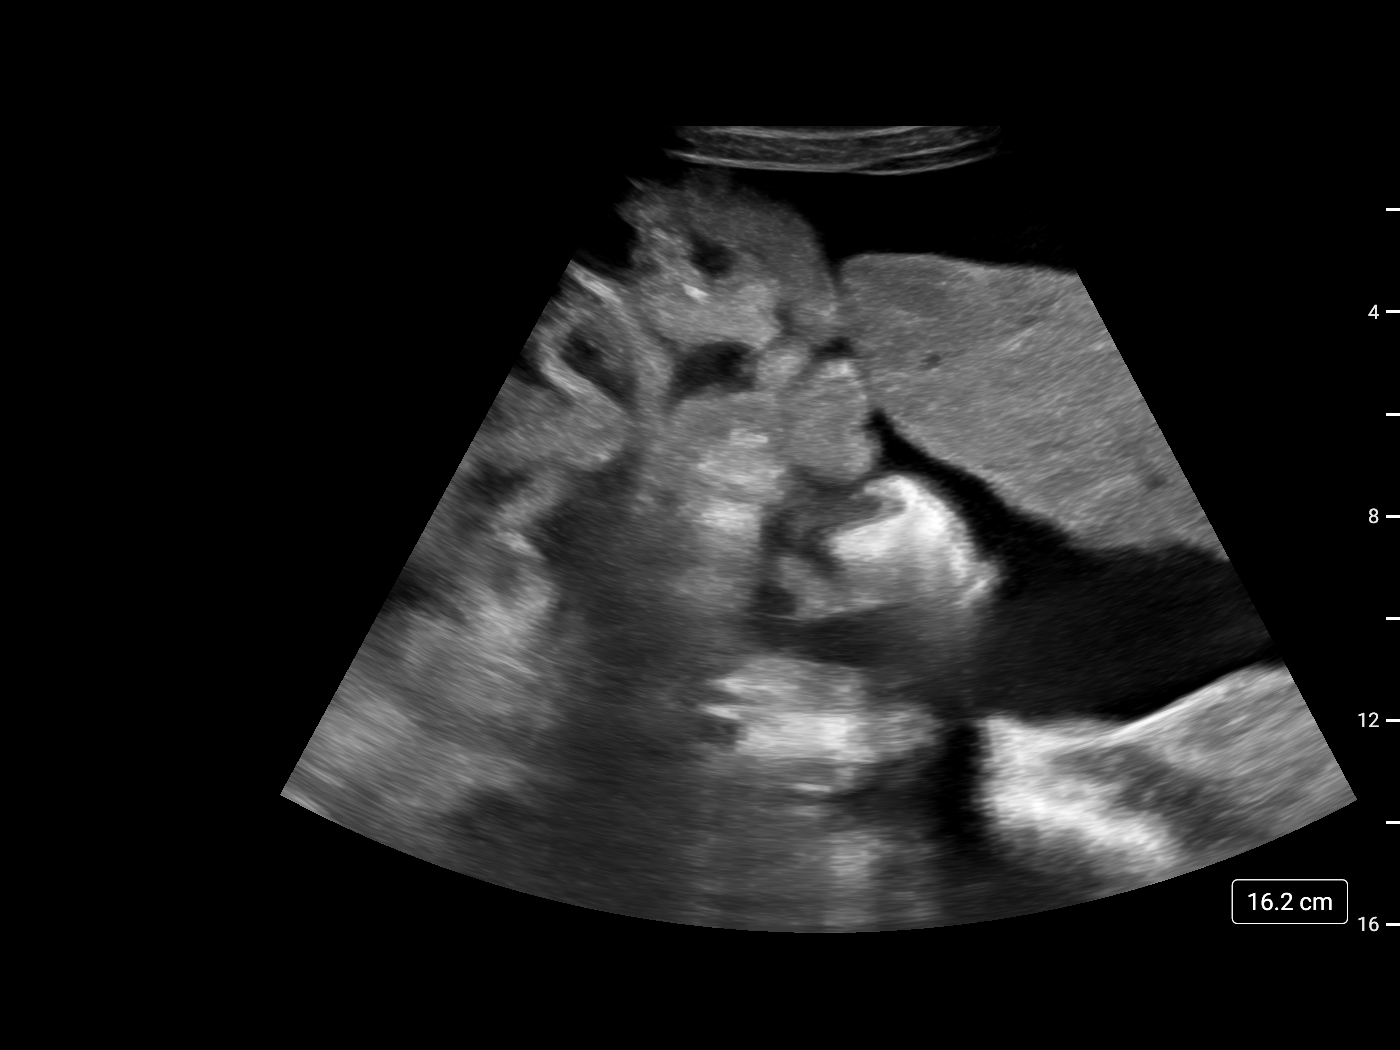
[im 2/3]
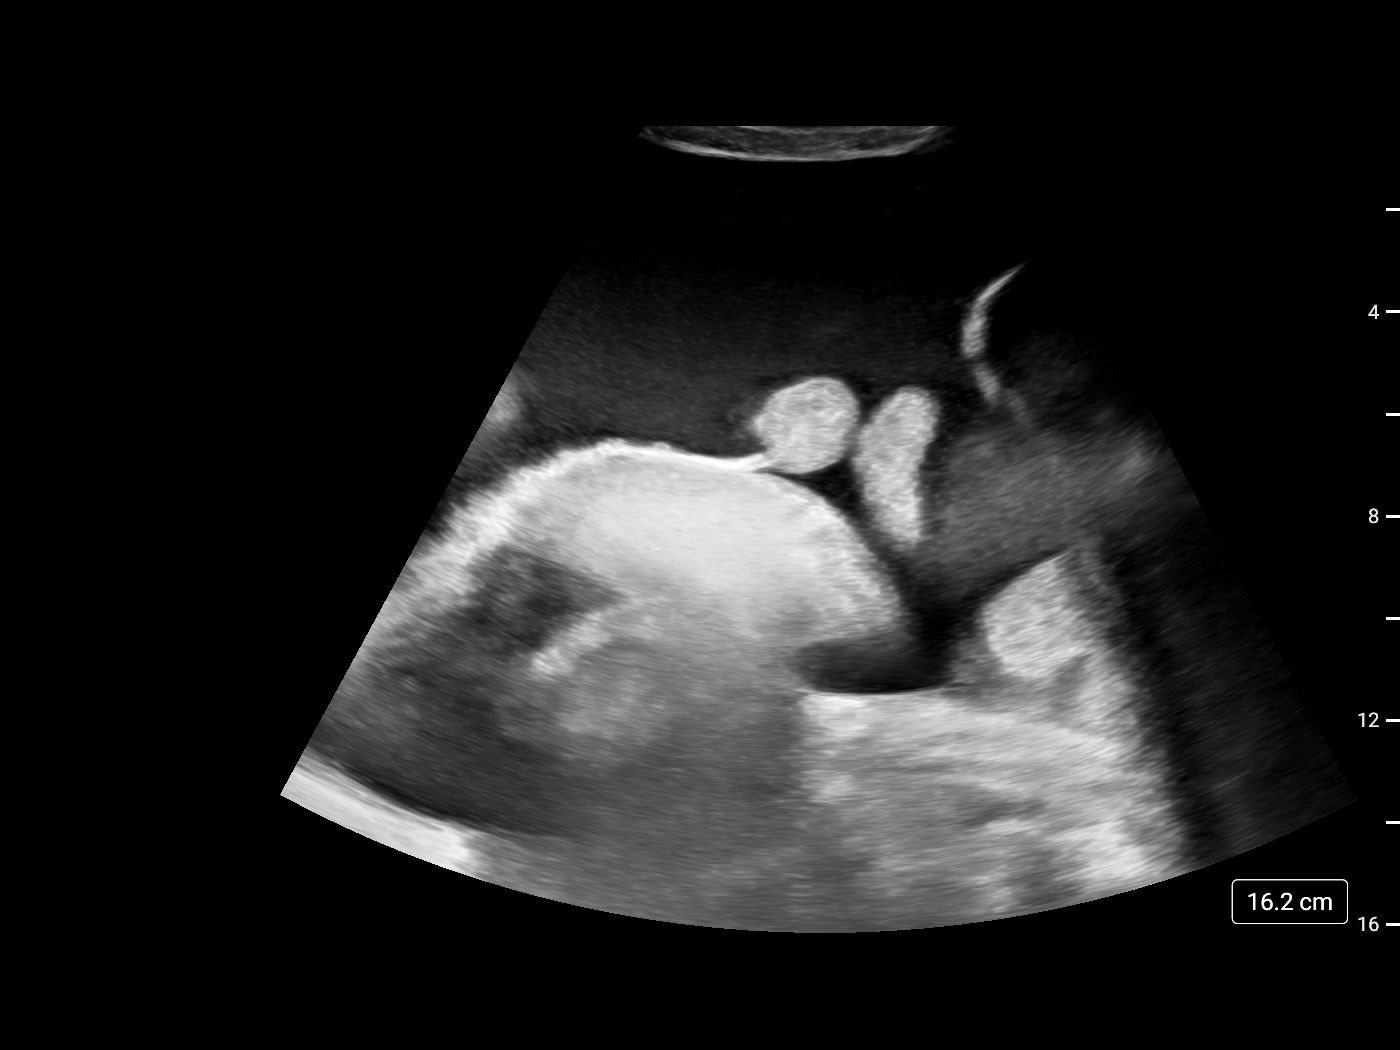
[im 3/3]
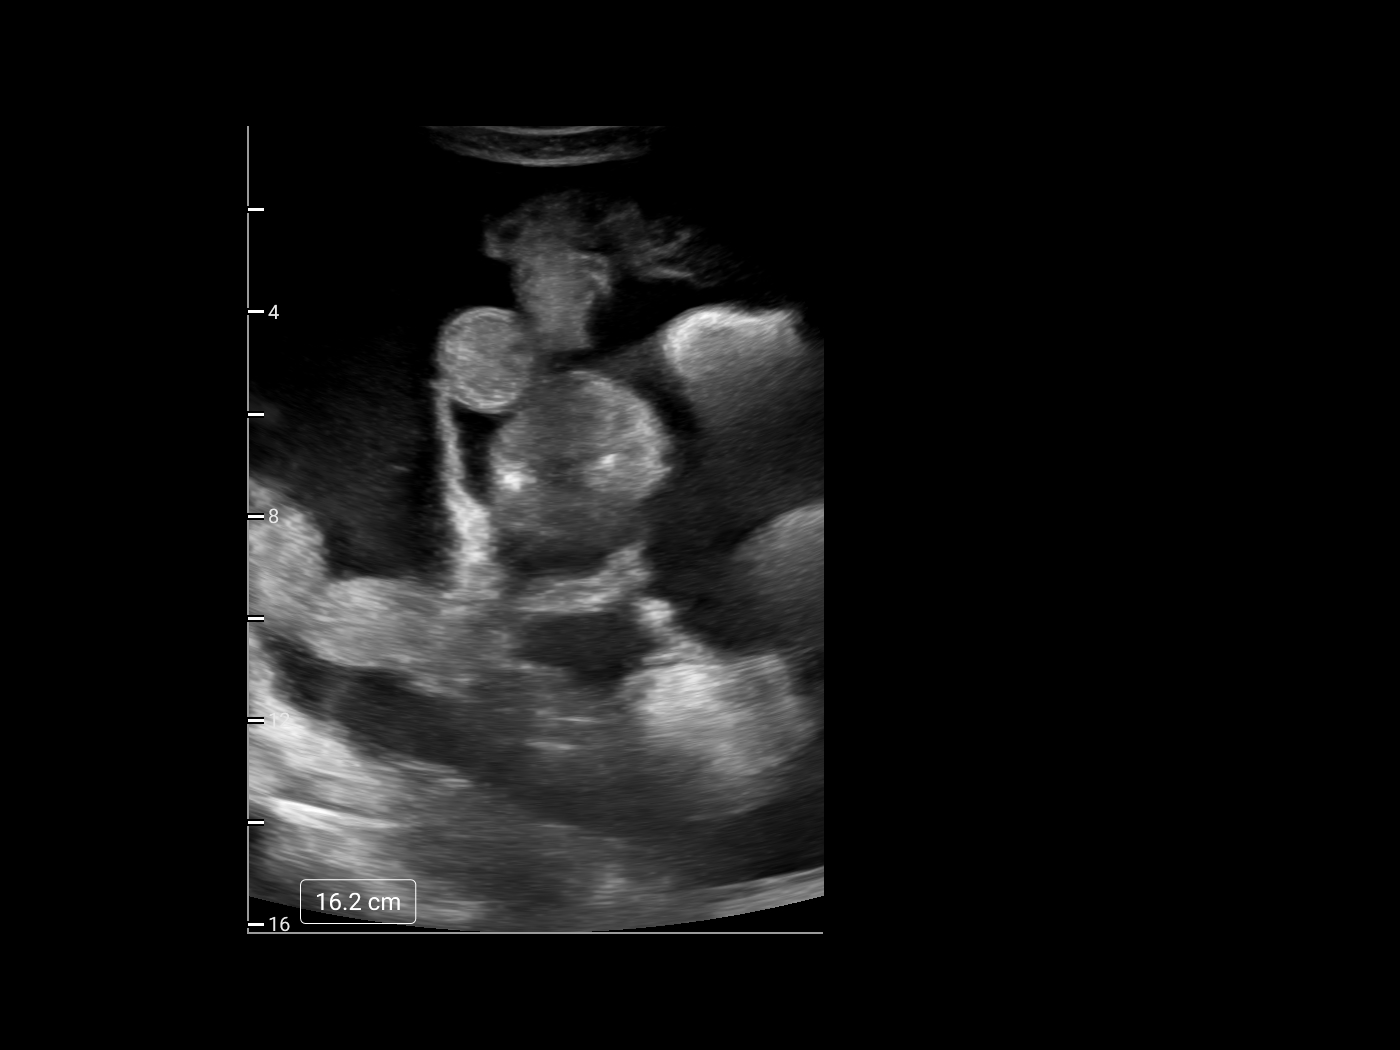

[3 of 3 positions shown; findings below may reference images not displayed]

EXAM:
ULTRASOUND GUIDED THERAPEUTIC AND DIAGNOSTIC PARACENTESIS

MEDICATIONS:
None.

COMPLICATIONS:
None immediate.

PROCEDURE:
Informed written consent was obtained from the patient after a
discussion of the risks, benefits and alternatives to treatment. A
timeout was performed prior to the initiation of the procedure.

Initial ultrasound scanning demonstrates a large amount of ascites
within the right lower abdominal quadrant. The right lower abdomen
was prepped and draped in the usual sterile fashion. 1% lidocaine
was used for local anesthesia.

Following this, a 19 gauge, 7-cm, Yueh catheter was introduced. An
ultrasound image was saved for documentation purposes. The
paracentesis was performed. During procedure ascites the catheter
lost suction and was exchanged for a 6 French Safe-T-Centesis the
catheter was removed and a dressing was applied. The patient
tolerated the procedure well without immediate post procedural
complication.
FINDINGS: A total of approximately 2.5 L of serosanguineous fluid was removed.
Samples were sent to the laboratory as requested by the clinical
team.
IMPRESSION: Successful ultrasound-guided paracentesis yielding 2.5 liters of
peritoneal fluid.

Read by Josimari Gonzatto NP

## 2020-12-11 MED ORDER — LIDOCAINE HCL 1 % IJ SOLN
INTRAMUSCULAR | Status: AC
  Filled 2020-12-11: qty 20

## 2020-12-11 MED ORDER — LIDOCAINE HCL 1 % IJ SOLN
INTRAMUSCULAR | Status: DC | PRN
  Administered 2020-12-11: 10 mL

## 2020-12-11 NOTE — Procedures (Signed)
PROCEDURE SUMMARY:  Successful US guided paracentesis from LLQ.  Yielded 4.6 L of blood tinged fluid.  No immediate complications.  Pt tolerated well.   Specimen was not sent for labs.  EBL < 40mL  Ascencion Dike PA-C 12/11/2020 1:46 PM

## 2020-12-13 ENCOUNTER — Other Ambulatory Visit: Payer: Self-pay | Admitting: Student

## 2020-12-13 ENCOUNTER — Other Ambulatory Visit: Payer: Self-pay | Admitting: Family Medicine

## 2020-12-13 ENCOUNTER — Other Ambulatory Visit: Payer: Self-pay | Admitting: Cardiology

## 2020-12-13 DIAGNOSIS — I2781 Cor pulmonale (chronic): Secondary | ICD-10-CM

## 2020-12-13 DIAGNOSIS — I480 Paroxysmal atrial fibrillation: Secondary | ICD-10-CM

## 2020-12-13 DIAGNOSIS — R110 Nausea: Secondary | ICD-10-CM

## 2020-12-13 DIAGNOSIS — K219 Gastro-esophageal reflux disease without esophagitis: Secondary | ICD-10-CM

## 2020-12-14 NOTE — Telephone Encounter (Signed)
Requested medication (s) are due for refill today: yes  Requested medication (s) are on the active medication list: yes  Last refill:  12/01/20  Future visit scheduled: yes  Notes to clinic:  not delegated to NT to RF   Requested Prescriptions  Pending Prescriptions Disp Refills   ondansetron (ZOFRAN) 4 MG tablet [Pharmacy Med Name: ONDANSETRON HCL 4 MG TABLET] 30 tablet 0    Sig: TAKE 1 TABLET BY MOUTH EVERY 8 HOURS AS NEEDED FOR NAUSEA AND VOMITING      Not Delegated - Gastroenterology: Antiemetics Failed - 12/13/2020  9:13 PM      Failed - This refill cannot be delegated      Passed - Valid encounter within last 6 months    Recent Outpatient Visits           4 days ago Weakness of both lower extremities   [place], Charlane Ferretti, MD   4 months ago [place] of finger of right hand   [place], Charlane Ferretti, MD   5 months ago Gross hematuria   [place], Charlane Ferretti, MD   5 months ago AF (paroxysmal atrial fibrillation) [facility])   [place], [place], MD       Future Appointments             In 1 month [place], Gerline [place], [facility]   In 5 months Charlott Rakes, MD [place]

## 2020-12-16 ENCOUNTER — Ambulatory Visit (HOSPITAL_COMMUNITY): Payer: Medicare Other

## 2020-12-18 ENCOUNTER — Other Ambulatory Visit: Payer: Self-pay

## 2020-12-18 ENCOUNTER — Ambulatory Visit (HOSPITAL_COMMUNITY)
Admission: RE | Admit: 2020-12-18 | Discharge: 2020-12-18 | Disposition: A | Discharge status: Home / Self Care | Payer: Medicare Other | Source: Ambulatory Visit | Attending: Family Medicine | Admitting: Family Medicine

## 2020-12-18 DIAGNOSIS — R188 Other ascites: Secondary | ICD-10-CM | POA: Diagnosis present

## 2020-12-18 HISTORY — PX: IR PARACENTESIS: IMG2679

## 2020-12-18 MED ORDER — LIDOCAINE HCL (PF) 1 % IJ SOLN
INTRAMUSCULAR | Status: AC
  Filled 2020-12-18: qty 30

## 2020-12-18 MED ORDER — LIDOCAINE HCL 1 % IJ SOLN
INTRAMUSCULAR | Status: DC | PRN
  Administered 2020-12-18 (×2): 15 mL

## 2020-12-18 NOTE — Procedures (Signed)
PROCEDURE SUMMARY:  Successful US guided paracentesis from left lateral abdomen.  Yielded 3.4 liters of amber fluid.  No immediate complications.  Patient tolerated well.  EBL = trace   Moyinoluwa Dawe S Daryll Spisak PA-C 12/18/2020 1:38 PM

## 2020-12-22 ENCOUNTER — Telehealth: Payer: Self-pay | Admitting: Family Medicine

## 2020-12-22 DIAGNOSIS — K7460 Unspecified cirrhosis of liver: Secondary | ICD-10-CM

## 2020-12-22 NOTE — Telephone Encounter (Signed)
Will route to PCP for review. 

## 2020-12-22 NOTE — Telephone Encounter (Signed)
Pt is calling to request a standing order for parasitosis every week at [facility] hospital radiology [place] Pt states that he has to call every week for an appt for Thursday at 1pm and there are no apts. Pt reports that fluid keep filling up on his stomach and he is not able to eat without the fluid being drained. Pt is extremely upset and is wanting an appt for Thursday. Pt is requesting a call back today.  And he needs to set up transportation. Cb- [phone]

## 2020-12-22 NOTE — Telephone Encounter (Signed)
Five orders have been placed. I previously placed standing orders for him on 6/9, 6/2, 5/24.  So he is not having to call us every week for these order as he just needs to let us know when his orders have run out so I can place additional orders as these orders are placed manually.

## 2020-12-24 ENCOUNTER — Telehealth: Payer: Self-pay | Admitting: Family Medicine

## 2020-12-24 NOTE — Telephone Encounter (Signed)
Old message patient's orders has been placed and patient has appointment set for tomorrow.

## 2020-12-24 NOTE — Telephone Encounter (Signed)
Copied from [place] [id number]. Topic: General - Other >> Dec 18, 2020  1:16 PM Leward Quan A wrote: Reason for CRM: Deirdre Evener with the IR department called in to inquire of Dr Margarita Rana about standing orders for patient Paracentesis treatments. Say that they need orders to continue treating him. Please contact patient with follow up

## 2020-12-25 ENCOUNTER — Ambulatory Visit (HOSPITAL_COMMUNITY): Payer: Medicare Other

## 2020-12-29 ENCOUNTER — Telehealth: Payer: Self-pay

## 2020-12-29 NOTE — Telephone Encounter (Signed)
Pt stated he needs a doctor note to excuse him from not wearing a seat belt due to his condition with his stomach. Pt stated he is scheduled for court in August and he needs that note well before he has to go to court. Pt requests call back

## 2020-12-30 ENCOUNTER — Ambulatory Visit (HOSPITAL_COMMUNITY)
Admission: RE | Admit: 2020-12-30 | Discharge: 2020-12-30 | Disposition: A | Discharge status: Home / Self Care | Payer: Medicare Other | Source: Ambulatory Visit | Attending: Family Medicine | Admitting: Family Medicine

## 2020-12-30 ENCOUNTER — Other Ambulatory Visit: Payer: Self-pay

## 2020-12-30 ENCOUNTER — Ambulatory Visit: Payer: Medicare Other

## 2020-12-30 DIAGNOSIS — R188 Other ascites: Secondary | ICD-10-CM | POA: Diagnosis not present

## 2020-12-30 DIAGNOSIS — K7460 Unspecified cirrhosis of liver: Secondary | ICD-10-CM | POA: Diagnosis present

## 2020-12-30 HISTORY — PX: IR PARACENTESIS: IMG2679

## 2020-12-30 MED ORDER — LIDOCAINE HCL 1 % IJ SOLN
INTRAMUSCULAR | Status: DC | PRN
  Administered 2020-12-30: 10 mL

## 2020-12-30 MED ORDER — LIDOCAINE HCL 1 % IJ SOLN
INTRAMUSCULAR | Status: AC
  Filled 2020-12-30: qty 20

## 2020-12-30 NOTE — Telephone Encounter (Signed)
Unfortunately I am unable to provide a note excusing him from wearing a seat belt.

## 2020-12-30 NOTE — Telephone Encounter (Signed)
Pt was called and informed of PCP response. 

## 2020-12-30 NOTE — Procedures (Signed)
PROCEDURE SUMMARY:  Successful image-guided paracentesis from the left lower abdomen.  Yielded 4.2 liters of blood tinged fluid.  No immediate complications.  EBL = trace. Patient tolerated well.   Specimen was not sent for labs.  Please see imaging section of Epic for full dictation.   Armando Gang Abyan Cadman PA-C 12/30/2020 2:12 PM

## 2021-01-01 ENCOUNTER — Ambulatory Visit: Payer: Medicare Other

## 2021-01-06 ENCOUNTER — Encounter (HOSPITAL_COMMUNITY): Payer: Self-pay | Admitting: Student

## 2021-01-06 ENCOUNTER — Other Ambulatory Visit: Payer: Self-pay

## 2021-01-06 ENCOUNTER — Ambulatory Visit (HOSPITAL_COMMUNITY)
Admission: RE | Admit: 2021-01-06 | Discharge: 2021-01-06 | Disposition: A | Discharge status: Home / Self Care | Payer: Medicare Other | Source: Ambulatory Visit | Attending: Family Medicine | Admitting: Family Medicine

## 2021-01-06 DIAGNOSIS — K7460 Unspecified cirrhosis of liver: Secondary | ICD-10-CM | POA: Diagnosis not present

## 2021-01-06 DIAGNOSIS — R188 Other ascites: Secondary | ICD-10-CM | POA: Diagnosis not present

## 2021-01-06 HISTORY — PX: IR PARACENTESIS: IMG2679

## 2021-01-06 MED ORDER — LIDOCAINE HCL 1 % IJ SOLN
INTRAMUSCULAR | Status: AC
  Filled 2021-01-06: qty 20

## 2021-01-06 MED ORDER — LIDOCAINE HCL 1 % IJ SOLN
INTRAMUSCULAR | Status: AC | PRN
  Administered 2021-01-06: 10 mL

## 2021-01-06 NOTE — Procedures (Signed)
PROCEDURE SUMMARY:  Successful US guided paracentesis from left abdomen  Yielded 3.6 L of cloudy pink fluid.  No immediate complications.  Pt tolerated well.   EBL < 1 mL  Theresa Duty, NP 01/06/2021 5:02 PM

## 2021-01-07 ENCOUNTER — Other Ambulatory Visit: Payer: Self-pay | Admitting: Student

## 2021-01-07 DIAGNOSIS — I2781 Cor pulmonale (chronic): Secondary | ICD-10-CM

## 2021-01-07 IMAGING — US IR PARACENTESIS
1 series · 3 of 3 positions shown · non-contrast
Comparison: none

INDICATION: History of renal failure. Cirrhosis. Abdominal distention secondary
to recurrent ascites. Request for therapeutic paracentesis.

[Series 1: (id) · 3 of 3 slices shown]
[im 1/3]
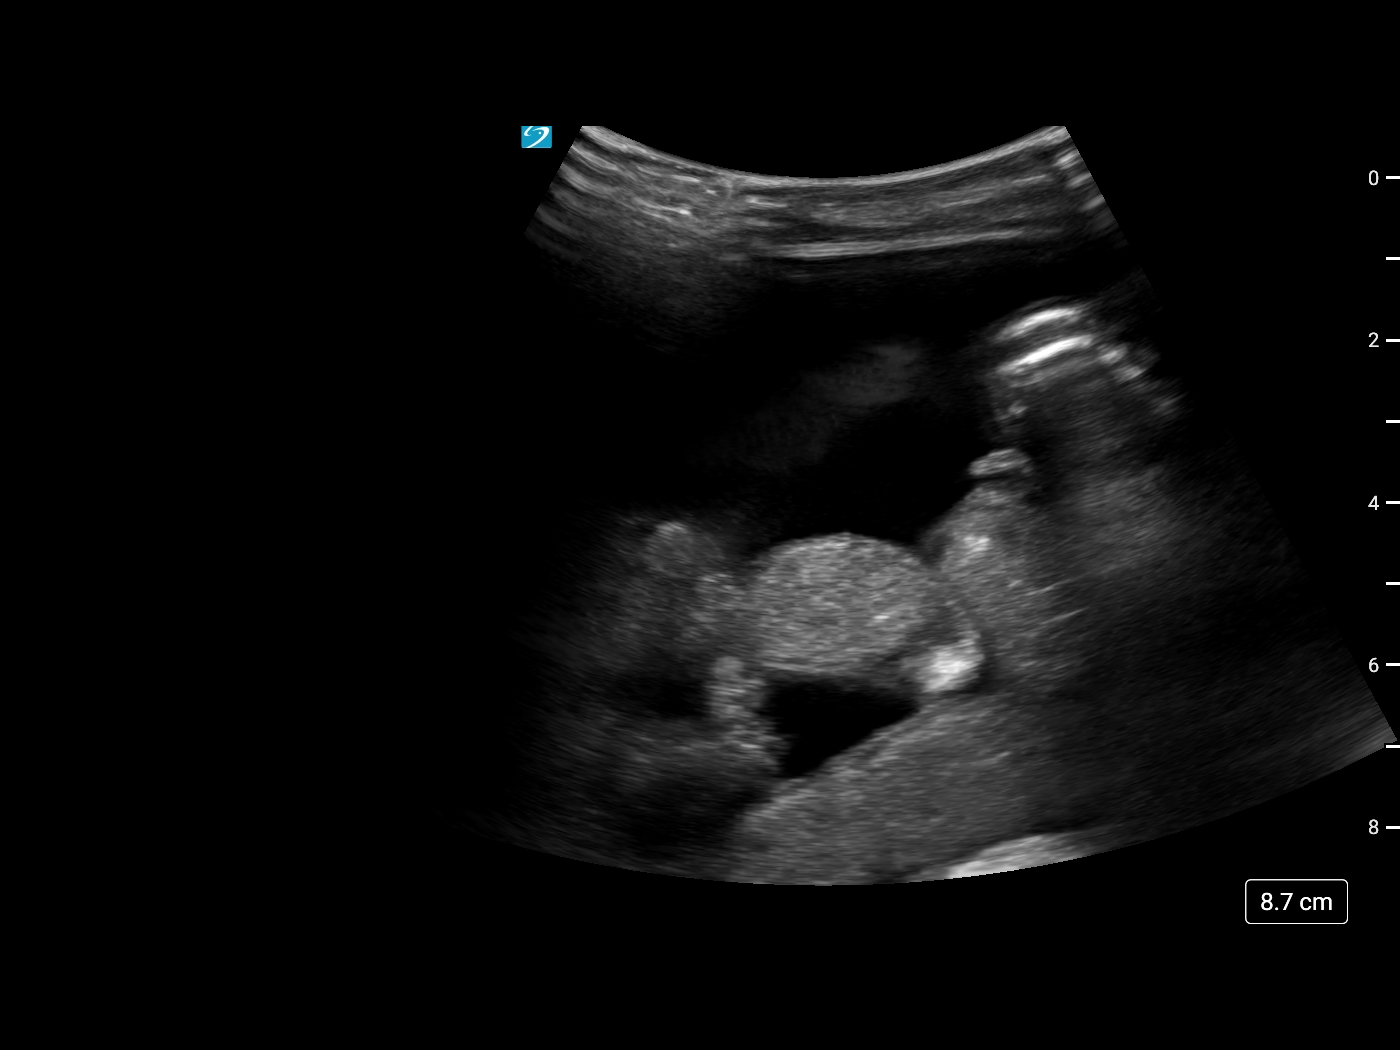
[im 2/3]
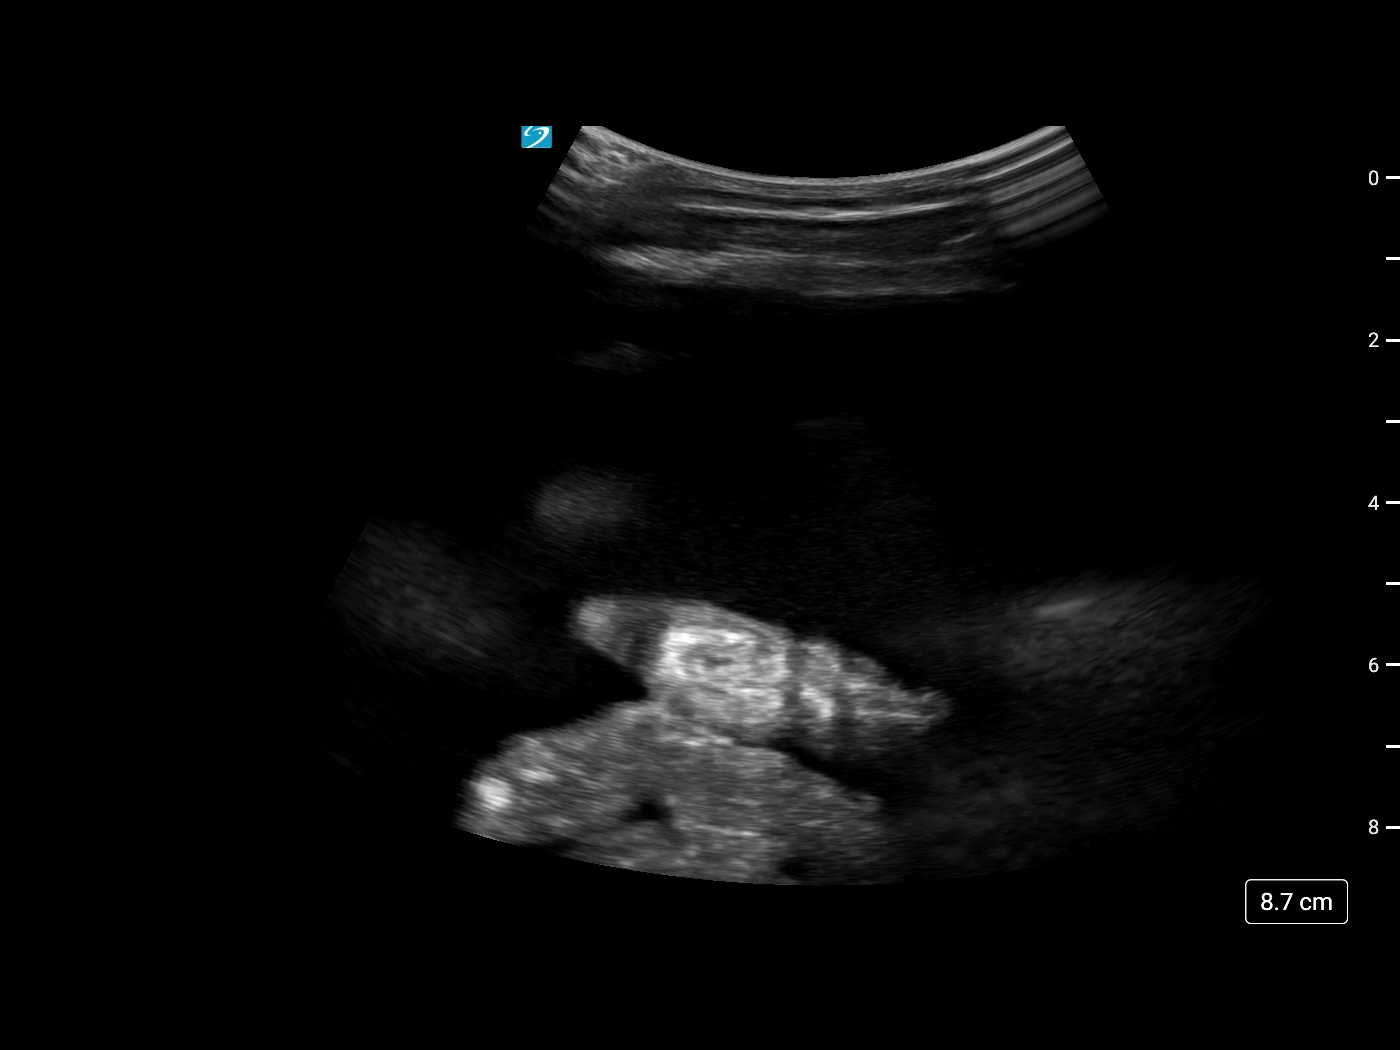
[im 3/3]
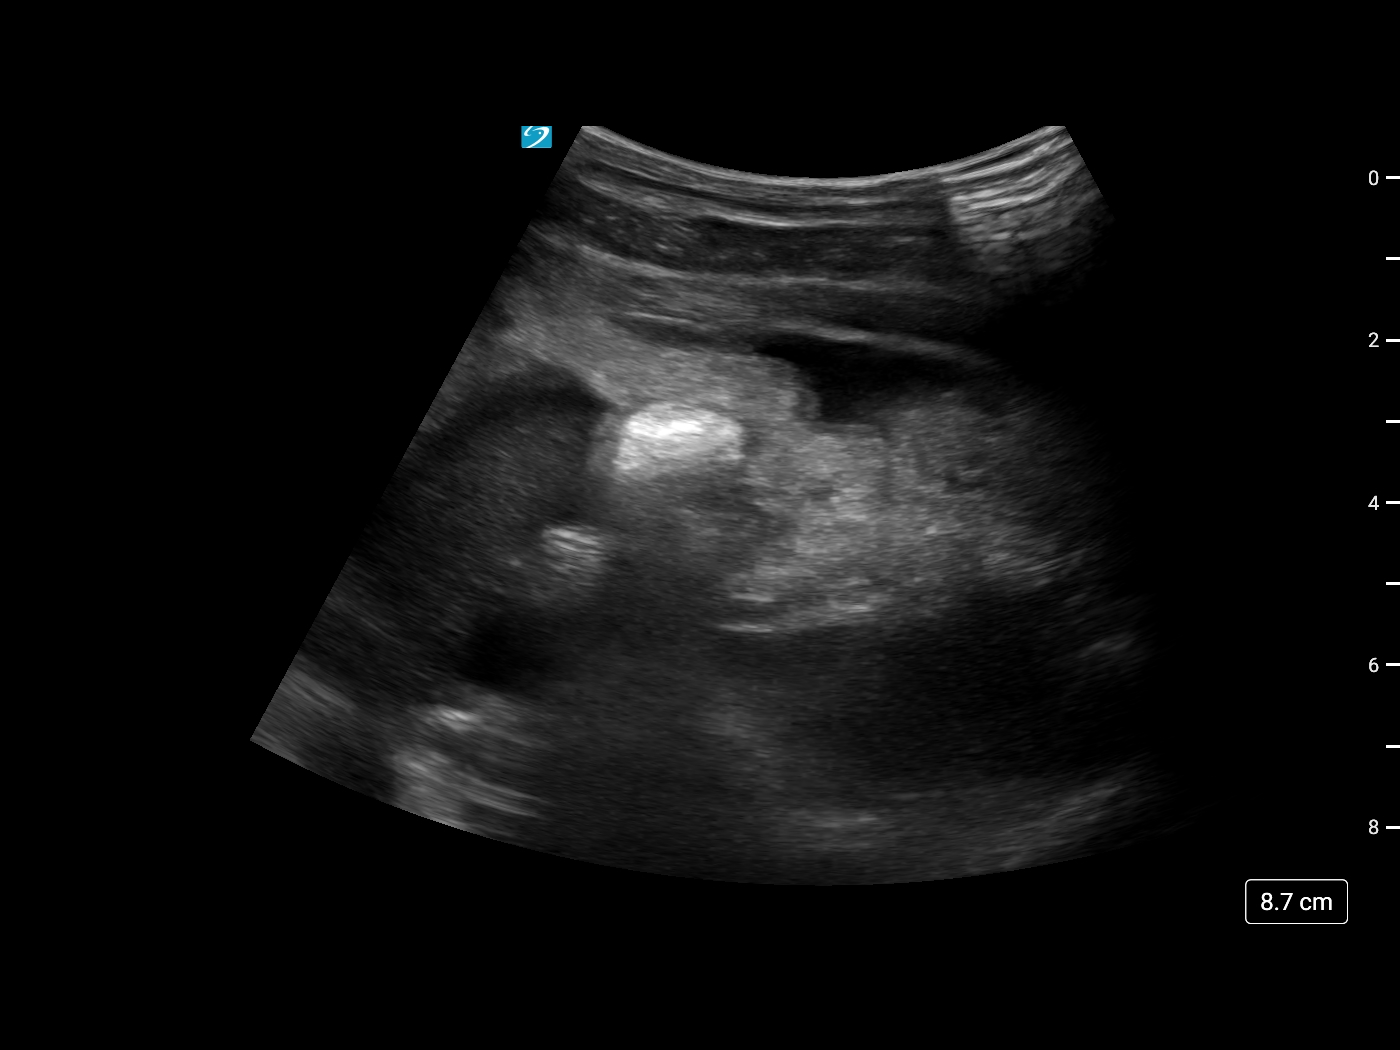

[3 of 3 positions shown; findings below may reference images not displayed]

EXAM:
ULTRASOUND GUIDED LEFT LOWER QUADRANT PARACENTESIS

MEDICATIONS:
None.

COMPLICATIONS:
None immediate.

PROCEDURE:
Informed written consent was obtained from the patient after a
discussion of the risks, benefits and alternatives to treatment. A
timeout was performed prior to the initiation of the procedure.

Initial ultrasound scanning demonstrates a large amount of ascites
within the left lower abdominal quadrant. The left lower abdomen was
prepped and draped in the usual sterile fashion. 1% lidocaine was
used for local anesthesia.

Following this, a 19 gauge, 7-cm, Yueh catheter was introduced. An
ultrasound image was saved for documentation purposes. The
paracentesis was performed. The catheter was removed and a dressing
was applied. The patient tolerated the procedure well without
immediate post procedural complication.
FINDINGS: A total of approximately 2.1 L of blood-tinged fluid was removed.
IMPRESSION: Successful ultrasound-guided paracentesis yielding 2.1 liters of
peritoneal fluid.

## 2021-01-13 ENCOUNTER — Other Ambulatory Visit: Payer: Self-pay

## 2021-01-13 ENCOUNTER — Ambulatory Visit (HOSPITAL_COMMUNITY)
Admission: RE | Admit: 2021-01-13 | Discharge: 2021-01-13 | Disposition: A | Discharge status: Home / Self Care | Payer: Medicare Other | Source: Ambulatory Visit | Attending: Family Medicine | Admitting: Family Medicine

## 2021-01-13 ENCOUNTER — Ambulatory Visit: Payer: Medicare Other | Admitting: Rehabilitative and Restorative Service Providers"

## 2021-01-13 DIAGNOSIS — R188 Other ascites: Secondary | ICD-10-CM | POA: Diagnosis not present

## 2021-01-13 DIAGNOSIS — K7460 Unspecified cirrhosis of liver: Secondary | ICD-10-CM | POA: Diagnosis not present

## 2021-01-13 HISTORY — PX: IR PARACENTESIS: IMG2679

## 2021-01-13 MED ORDER — LIDOCAINE HCL (PF) 1 % IJ SOLN
INTRAMUSCULAR | Status: DC | PRN
  Administered 2021-01-13: 5 mL

## 2021-01-13 MED ORDER — LIDOCAINE HCL 1 % IJ SOLN
INTRAMUSCULAR | Status: AC
  Filled 2021-01-13: qty 20

## 2021-01-13 NOTE — Procedures (Signed)
Ultrasound-guided therapeutic paracentesis performed yielding 2.7 liters of blood-tinged fluid. No immediate complications. EBL none.

## 2021-01-15 ENCOUNTER — Ambulatory Visit: Payer: Medicare Other | Admitting: Physical Therapy

## 2021-01-15 IMAGING — DX DG CHEST 1V PORT
1 series · 1 of 1 positions shown · non-contrast
Comparison: Portable exam 6949 hours compared to 07/15/2019

CLINICAL DATA: Tachycardia, shortness of breath, hyperkalemia,
dialysis patient, hypertension, COPD, colorectal cancer, bony
metastases

EXAM:
PORTABLE CHEST 1 VIEW

[chest]
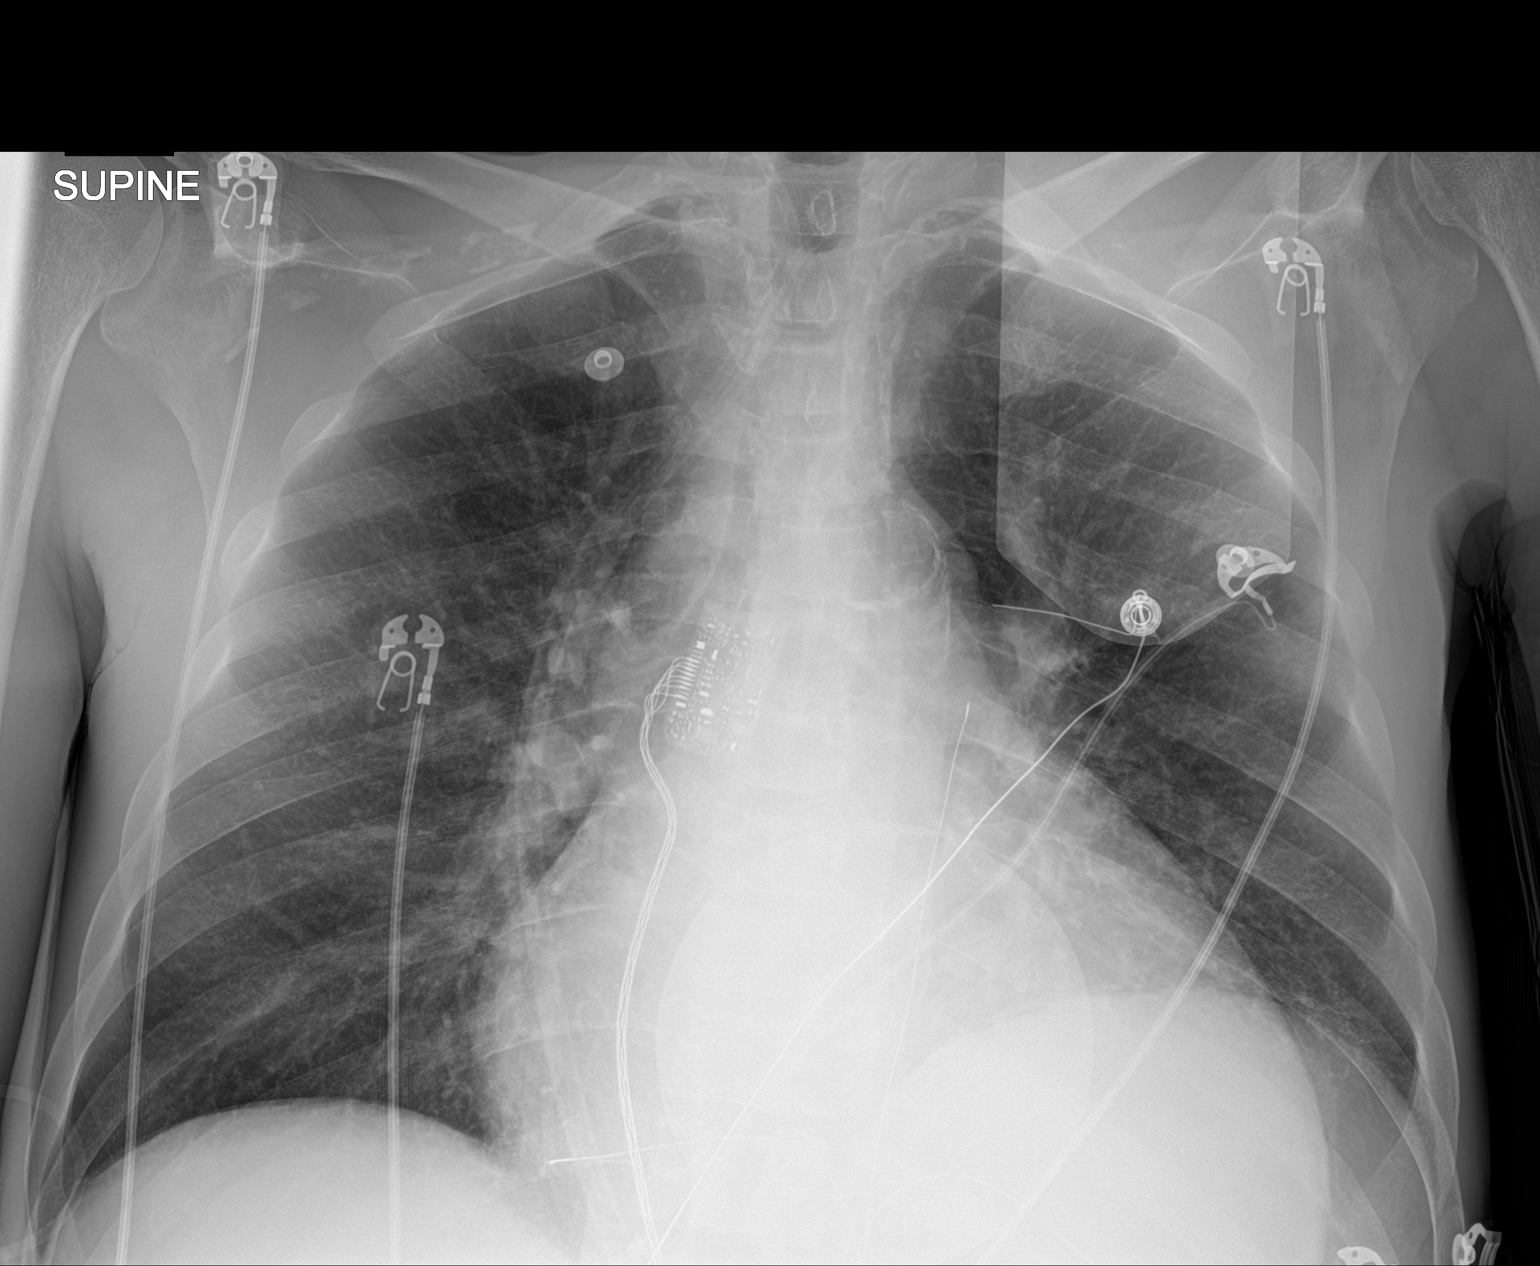

[1 of 1 positions shown; findings below may reference images not displayed]

FINDINGS: External pacing leads project over chest.

Enlargement of cardiac silhouette.

Mediastinal contours and pulmonary vascularity normal.

Atherosclerotic calcification aorta.

Elevation of LEFT diaphragm with LEFT basilar atelectasis.

Remaining lungs clear.

Skin fold projects over lateral upper RIGHT hemithorax.

No acute infiltrate, pleural effusion or pneumothorax.
IMPRESSION: Persistent mild LEFT basilar atelectasis.

Enlargement of cardiac silhouette.

## 2021-01-17 ENCOUNTER — Other Ambulatory Visit: Payer: Self-pay

## 2021-01-17 ENCOUNTER — Encounter (HOSPITAL_COMMUNITY): Payer: Self-pay | Admitting: Emergency Medicine

## 2021-01-17 ENCOUNTER — Emergency Department (HOSPITAL_COMMUNITY)
Admission: EM | Admit: 2021-01-17 | Discharge: 2021-01-17 | Discharge status: Against Medical Advice | Payer: Medicare Other | Attending: Emergency Medicine | Admitting: Emergency Medicine

## 2021-01-17 DIAGNOSIS — I120 Hypertensive chronic kidney disease with stage 5 chronic kidney disease or end stage renal disease: Secondary | ICD-10-CM | POA: Insufficient documentation

## 2021-01-17 DIAGNOSIS — K7031 Alcoholic cirrhosis of liver with ascites: Secondary | ICD-10-CM | POA: Diagnosis not present

## 2021-01-17 DIAGNOSIS — Z5321 Procedure and treatment not carried out due to patient leaving prior to being seen by health care provider: Secondary | ICD-10-CM | POA: Insufficient documentation

## 2021-01-17 DIAGNOSIS — Z79899 Other long term (current) drug therapy: Secondary | ICD-10-CM | POA: Insufficient documentation

## 2021-01-17 DIAGNOSIS — I2510 Atherosclerotic heart disease of native coronary artery without angina pectoris: Secondary | ICD-10-CM | POA: Diagnosis not present

## 2021-01-17 DIAGNOSIS — R188 Other ascites: Secondary | ICD-10-CM

## 2021-01-17 DIAGNOSIS — N186 End stage renal disease: Secondary | ICD-10-CM | POA: Diagnosis not present

## 2021-01-17 DIAGNOSIS — Z86010 Personal history of colonic polyps: Secondary | ICD-10-CM | POA: Insufficient documentation

## 2021-01-17 DIAGNOSIS — J449 Chronic obstructive pulmonary disease, unspecified: Secondary | ICD-10-CM | POA: Diagnosis not present

## 2021-01-17 DIAGNOSIS — R0602 Shortness of breath: Secondary | ICD-10-CM | POA: Diagnosis present

## 2021-01-17 HISTORY — DX: Anxiety disorder, unspecified: F41.9

## 2021-01-17 HISTORY — DX: Chronic obstructive pulmonary disease, unspecified: J44.9

## 2021-01-17 HISTORY — DX: Chronic pulmonary edema: J81.1

## 2021-01-17 HISTORY — DX: Atherosclerotic heart disease of native coronary artery without angina pectoris: I25.10

## 2021-01-17 HISTORY — DX: Cocaine abuse, uncomplicated: F14.10

## 2021-01-17 HISTORY — DX: Acute myocardial infarction, unspecified: I21.9

## 2021-01-17 HISTORY — DX: Unspecified hemorrhoids: K64.9

## 2021-01-17 HISTORY — DX: Essential (primary) hypertension: I10

## 2021-01-17 HISTORY — DX: Unspecified viral hepatitis C without hepatic coma: B19.20

## 2021-01-17 HISTORY — DX: Benign neoplasm of colon, unspecified: D12.6

## 2021-01-17 HISTORY — DX: Unspecified osteoarthritis, unspecified site: M19.90

## 2021-01-17 HISTORY — DX: Rheumatic mitral stenosis: I05.0

## 2021-01-17 HISTORY — DX: Unspecified viral hepatitis B without hepatic coma: B19.10

## 2021-01-17 HISTORY — DX: End stage renal disease: N18.6

## 2021-01-17 HISTORY — DX: Atherosclerosis of aorta: I70.0

## 2021-01-17 HISTORY — DX: Gastro-esophageal reflux disease without esophagitis: K21.9

## 2021-01-17 HISTORY — DX: Disorder of kidney and ureter, unspecified: N28.9

## 2021-01-17 HISTORY — DX: Anemia, unspecified: D64.9

## 2021-01-17 HISTORY — DX: Cardiac arrhythmia, unspecified: I49.9

## 2021-01-17 HISTORY — DX: Cardiomegaly: I51.7

## 2021-01-17 HISTORY — DX: Ulcer of anus and rectum: K62.6

## 2021-01-17 HISTORY — DX: Chest pain, unspecified: R07.9

## 2021-01-17 LAB — BASIC METABOLIC PANEL
Anion gap: 13 (ref 5–15)
BUN: 19 mg/dL (ref 6–20)
CO2: 28 mmol/L (ref 22–32)
Calcium: 8 mg/dL — ABNORMAL LOW (ref 8.9–10.3)
Chloride: 90 mmol/L — ABNORMAL LOW (ref 98–111)
Creatinine, Ser: 6.75 mg/dL — ABNORMAL HIGH (ref 0.61–1.24)
GFR, Estimated: 9 mL/min — ABNORMAL LOW (ref >60)
Glucose, Bld: 136 mg/dL — ABNORMAL HIGH (ref 70–99)
Potassium: 4.5 mmol/L (ref 3.5–5.1)
Sodium: 131 mmol/L — ABNORMAL LOW (ref 135–145)

## 2021-01-17 LAB — CBC
HCT: 32.3 % — ABNORMAL LOW (ref 39.0–52.0)
Hemoglobin: 11.3 g/dL — ABNORMAL LOW (ref 13.0–17.0)
MCH: 28.9 pg (ref 26.0–34.0)
MCHC: 35 g/dL (ref 30.0–36.0)
MCV: 82.6 fL (ref 80.0–100.0)
Platelets: 163 10*3/uL (ref 150–400)
RBC: 3.91 MIL/uL — ABNORMAL LOW (ref 4.22–5.81)
RDW: 14.1 % (ref 11.5–15.5)
WBC: 7.2 10*3/uL (ref 4.0–10.5)
nRBC: 0 % (ref 0.0–0.2)

## 2021-01-17 LAB — TROPONIN I (HIGH SENSITIVITY): Troponin I (High Sensitivity): 26 ng/L — ABNORMAL HIGH (ref <18)

## 2021-01-17 NOTE — ED Notes (Signed)
Pt eloped from room, gown found on counter and pt has not been located in room or bathroom in over an hour, pt voiced to staff on day shift that he was going to leave

## 2021-01-17 NOTE — ED Provider Notes (Signed)
[facility] EMERGENCY DEPARTMENT Provider Note   CSN: [id number] Arrival date & time: 01/17/21  1516     History No chief complaint on file.   [name] is a 60 y.o. male.  HPI Patient presents requesting paracentesis.  Has history of cirrhosis and has frequent paracentesis done by interventional radiology.  Last done on Tuesday.  He is also a dialysis patient and says that he is dialyzed Monday Wednesday Friday when was dialyzed Friday.  He is due for paracentesis again on Tuesday.  States his abdomen is more swollen.  No fevers.  States he feels more short of breath because of it.    Past Medical History:  Diagnosis Date   Anemia    Anxiety    Arthritis    Atherosclerosis of aorta (HCC)    Cardiomegaly    Chest pain    Cocaine abuse (HCC)    COPD (chronic obstructive pulmonary disease) (HCC)    Coronary artery disease    Dysrhythmia    ESRD (end stage renal disease) (HCC)    GERD (gastroesophageal reflux disease)    Hemorrhoids    Hepatitis B    Hepatitis C    Hypertension    Mitral stenosis    Myocardial infarction [facility])    Pulmonary edema    Renal disorder    Solitary rectal ulcer syndrome    Tubular adenoma of colon     There are no problems to display for this patient.       No family history on file.     Home Medications Prior to Admission medications   Medication Sig Start Date End Date Taking? Authorizing Provider  amiodarone (PACERONE) 200 MG tablet Take 200 mg by mouth daily.   Yes [provider]  cetirizine (ZYRTEC) 10 MG tablet Take 10 mg by mouth daily.   Yes [provider]  cinacalcet (SENSIPAR) 30 MG tablet Take 60 mg by mouth every Monday, Wednesday, and Friday. 2 tablets Monday Wednesday Friday. Given at dialysis   Yes [provider]  diltiazem (CARDIZEM SR) 120 MG 12 hr capsule Take 120 mg by mouth 2 (two) times daily.   Yes [provider]  gabapentin (NEURONTIN) 300 MG  capsule Take 300 mg by mouth 2 (two) times daily.   Yes [provider]  lactulose (CHRONULAC) 10 GM/15ML solution Take 20 g by mouth 2 (two) times daily as needed for mild constipation.   Yes [provider]  metoprolol succinate (TOPROL-XL) 25 MG 24 hr tablet Take 12.5 mg by mouth daily.   Yes [provider]  Oxycodone HCl 10 MG TABS Take 10 mg by mouth 3 (three) times daily as needed. pain   Yes [provider]  pantoprazole (PROTONIX) 40 MG tablet Take 40 mg by mouth daily.   Yes [provider]  sevelamer carbonate (RENVELA) 800 MG tablet Take 800 mg by mouth 3 (three) times daily with meals.   Yes [provider]    Allergies    Patient has no known allergies.  Review of Systems   Review of Systems  HENT:  Negative for congestion.   Respiratory:  Positive for shortness of breath. Negative for cough.   Cardiovascular:  Negative for chest pain.  Gastrointestinal:  Positive for abdominal distention and abdominal pain.  Musculoskeletal:  Negative for back pain.  Skin:  Negative for wound.  Neurological:  Negative for weakness.  Psychiatric/Behavioral:  Negative for confusion.    Physical Exam Updated  Vital Signs BP (!) 132/93   Pulse (!) 56   Temp 98.1 F (36.7 C)   Resp 18   SpO2 96%   Physical Exam Vitals and nursing note reviewed.  HENT:     Head: Normocephalic.  Eyes:     Pupils: Pupils are equal, round, and reactive to light.  Cardiovascular:     Rate and Rhythm: Regular rhythm.  Pulmonary:     Comments: Mildly harsh breath sounds. Abdominal:     General: There is distension.     Comments: Some distention with ascites.  Musculoskeletal:        General: No tenderness.     Cervical back: Neck supple.     Right lower leg: No edema.     Left lower leg: No edema.     Comments: Missing fingers on right hand.  Neurological:     General: No focal deficit present.     Mental Status: He is alert.    ED Results  / Procedures / Treatments   Labs (all labs ordered are listed, but only abnormal results are displayed) Labs Reviewed  BASIC METABOLIC PANEL - Abnormal; Notable for the following components:      Result Value   Sodium 131 (*)    Chloride 90 (*)    Glucose, Bld 136 (*)    Creatinine, Ser 6.75 (*)    Calcium 8.0 (*)    GFR, Estimated 9 (*)    All other components within normal limits  CBC - Abnormal; Notable for the following components:   RBC 3.91 (*)    Hemoglobin 11.3 (*)    HCT 32.3 (*)    All other components within normal limits  TROPONIN I (HIGH SENSITIVITY) - Abnormal; Notable for the following components:   Troponin I (High Sensitivity) 26 (*)    All other components within normal limits  TROPONIN I (HIGH SENSITIVITY)    EKG None  Radiology No results found.  Procedures Procedures   Medications Ordered in ED Medications - No data to display  ED Course  I have reviewed the triage vital signs and the nursing notes.  Pertinent labs & imaging results that were available during my care of the patient were reviewed by me and considered in my medical decision making (see chart for details).    MDM Rules/Calculators/A&P                          Patient presents because he feels he needs a paracentesis.  Last had 1 done on Tuesday with today being Saturday.  Has 1 scheduled for Tuesday again.  States he feels more short of breath because abdomen is more swollen.  Has previous visits for same under different MRN.  Lab work overall reassuring.  Chest x-ray ordered but patient eloped before it was completed.  I have informed patient that I would do a paracentesis but patient would not wait the time it took for me to be able to get the time to be able to do it.  Eloped from the ER. Final Clinical Impression(s) / ED Diagnoses Final diagnoses:  Ascites of liver    Rx / DC Orders ED Discharge Orders     None        Davonna Belling, MD 01/17/21 2329

## 2021-01-17 NOTE — ED Triage Notes (Signed)
Pt reports SOB and abd distention.  States he needs another paracentesis.  States last one on Tuesday.  **Notified registration that pt has been here numerous times- wrong date of birth was put in and chart is now marked for merge.  See PMH on previous encounter.

## 2021-01-19 ENCOUNTER — Encounter (HOSPITAL_COMMUNITY): Payer: Self-pay

## 2021-01-20 ENCOUNTER — Ambulatory Visit (HOSPITAL_COMMUNITY)
Admission: RE | Admit: 2021-01-20 | Discharge: 2021-01-20 | Disposition: A | Discharge status: Home / Self Care | Payer: Medicare Other | Source: Ambulatory Visit | Attending: Family Medicine | Admitting: Family Medicine

## 2021-01-20 ENCOUNTER — Other Ambulatory Visit: Payer: Self-pay

## 2021-01-20 DIAGNOSIS — R188 Other ascites: Secondary | ICD-10-CM | POA: Insufficient documentation

## 2021-01-20 DIAGNOSIS — K7460 Unspecified cirrhosis of liver: Secondary | ICD-10-CM | POA: Diagnosis present

## 2021-01-20 HISTORY — PX: IR PARACENTESIS: IMG2679

## 2021-01-20 IMAGING — DX DG CHEST 1V PORT
1 series · 1 of 1 positions shown · non-contrast
Comparison: July 17, 2019

CLINICAL DATA: Chest pain

EXAM:
PORTABLE CHEST 1 VIEW

[chest]
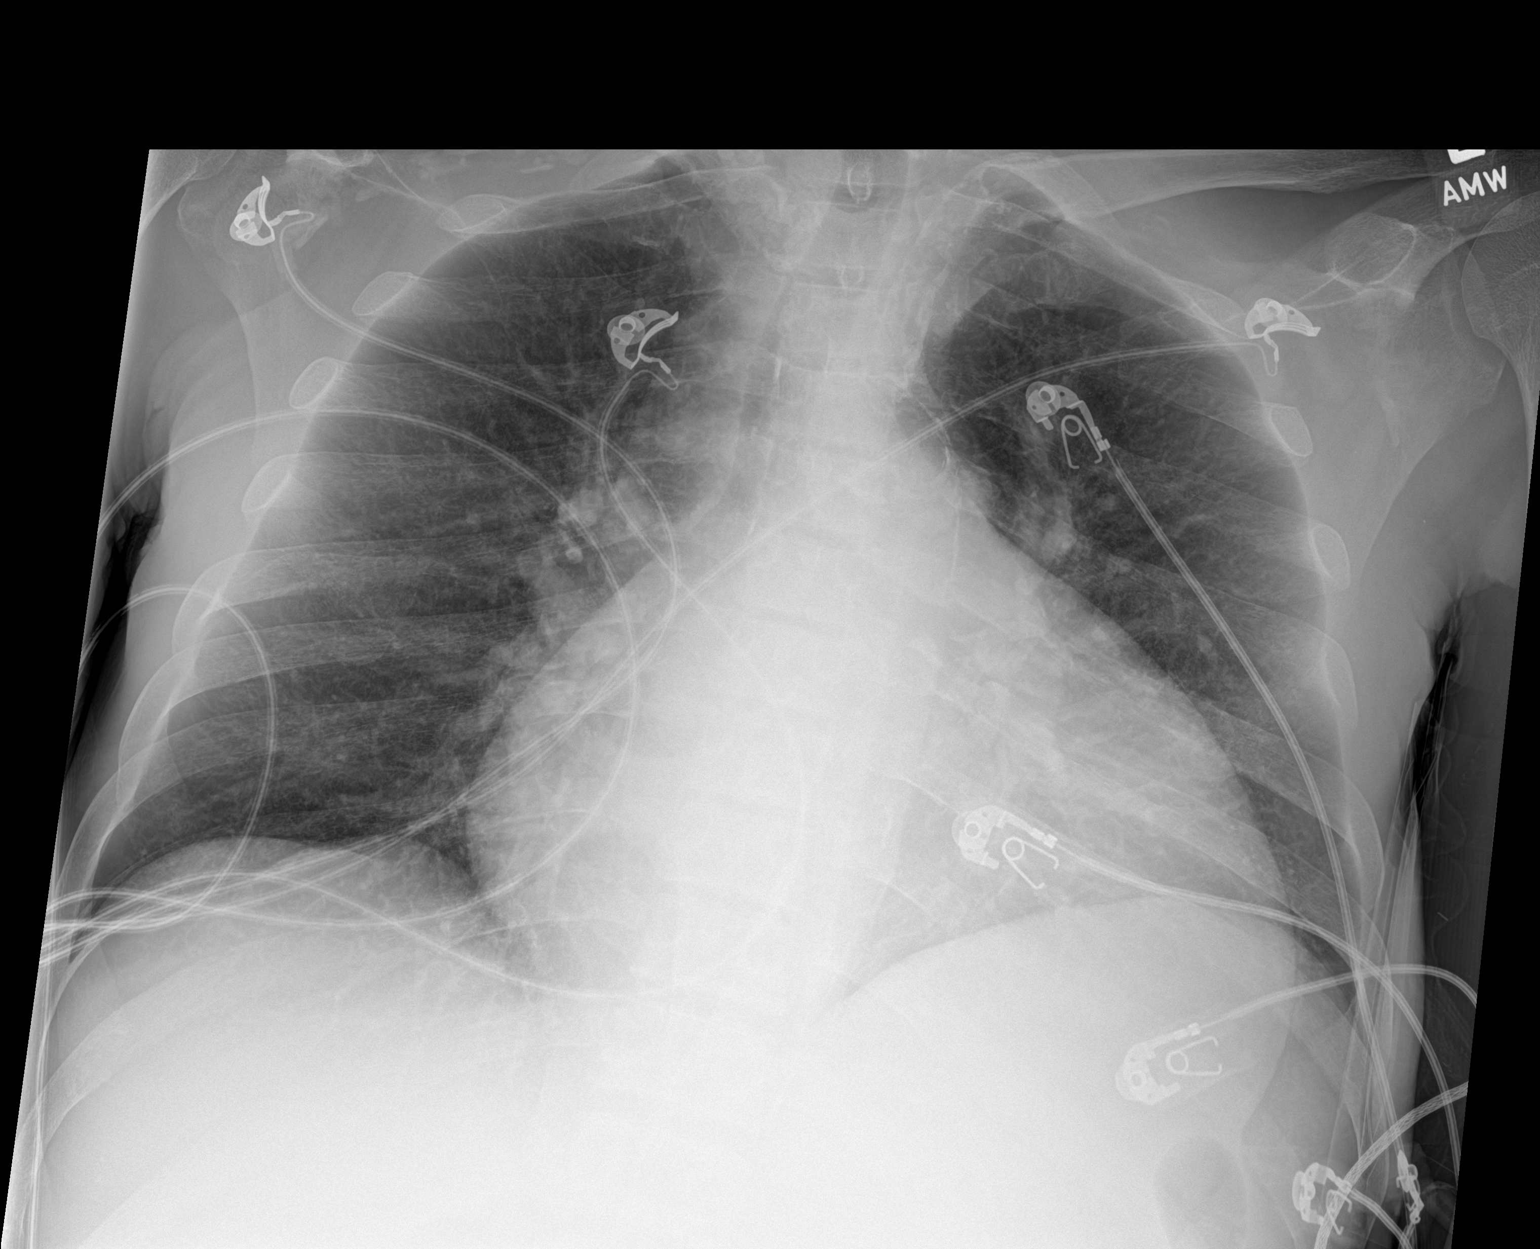

[1 of 1 positions shown; findings below may reference images not displayed]

FINDINGS: Lungs clear. There is cardiomegaly with pulmonary vascularity
normal. No adenopathy. There is aortic atherosclerosis. No bone
lesions. There is calcification in both carotid arteries as well as
in the right subclavian artery.
IMPRESSION: Stable cardiomegaly. Lungs clear. Aortic Atherosclerosis
([id number]).

## 2021-01-20 MED ORDER — LIDOCAINE HCL 1 % IJ SOLN
INTRAMUSCULAR | Status: AC
  Filled 2021-01-20: qty 20

## 2021-01-20 MED ORDER — LIDOCAINE HCL 1 % IJ SOLN
INTRAMUSCULAR | Status: DC | PRN
  Administered 2021-01-20: 10 mL

## 2021-01-20 NOTE — Procedures (Signed)
Ultrasound-guided therapeutic paracentesis performed yielding 4.2 liters of blood-tinged fluid. No immediate complications. EBL< 1 cc.

## 2021-01-21 IMAGING — DX DG CHEST 1V PORT
1 series · 1 of 1 positions shown · non-contrast
Comparison: July 21, 2018

CLINICAL DATA: Shortness of breath

EXAM:
PORTABLE CHEST 1 VIEW

[chest ap]
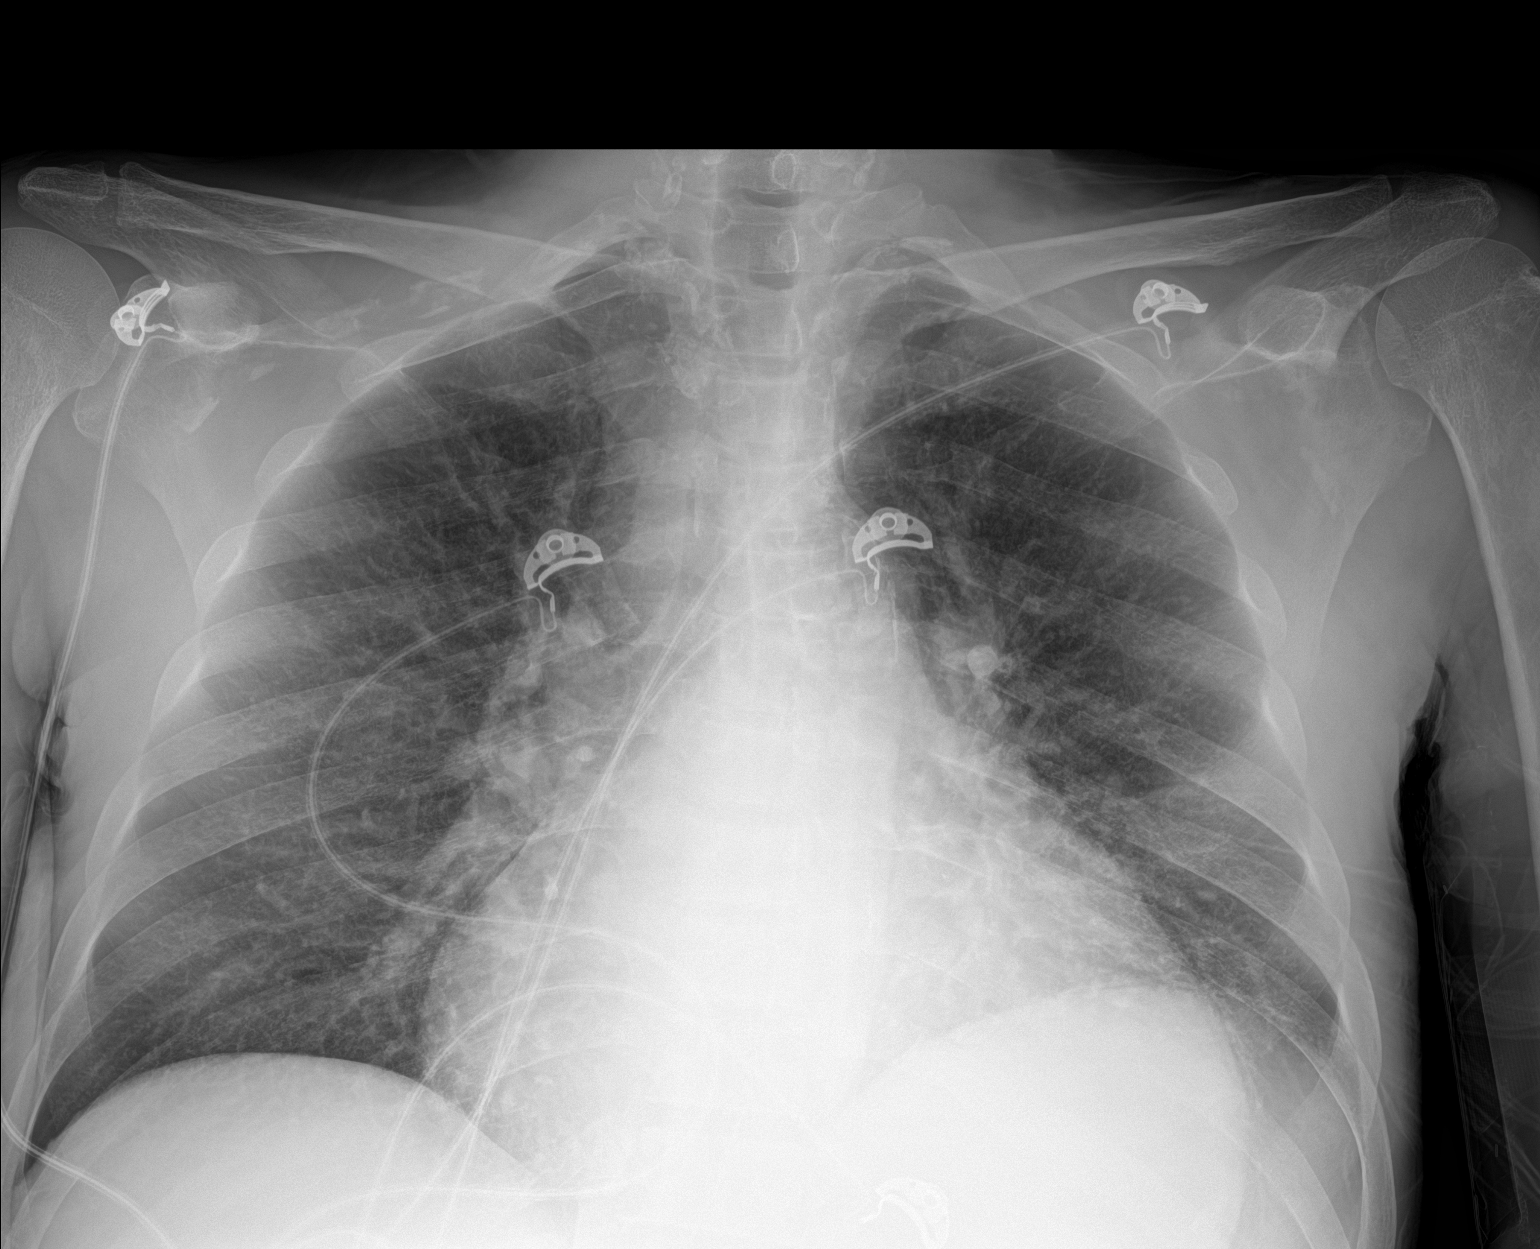

[1 of 1 positions shown; findings below may reference images not displayed]

FINDINGS: There is mild cardiomegaly. Aortic knob calcifications. Mild
prominence of the central pulmonary vasculature. The visualized
skeletal structures are unremarkable.
IMPRESSION: Cardiomegaly and mild pulmonary vascular congestion.

## 2021-01-22 ENCOUNTER — Ambulatory Visit: Payer: Medicare Other | Attending: Family Medicine | Admitting: Physical Therapy

## 2021-01-22 ENCOUNTER — Other Ambulatory Visit: Payer: Self-pay

## 2021-01-22 ENCOUNTER — Encounter: Payer: Self-pay | Admitting: Physical Therapy

## 2021-01-22 DIAGNOSIS — R296 Repeated falls: Secondary | ICD-10-CM | POA: Insufficient documentation

## 2021-01-22 DIAGNOSIS — R2689 Other abnormalities of gait and mobility: Secondary | ICD-10-CM | POA: Diagnosis present

## 2021-01-22 DIAGNOSIS — M6281 Muscle weakness (generalized): Secondary | ICD-10-CM | POA: Diagnosis present

## 2021-01-22 NOTE — Patient Instructions (Signed)
Access Code: [facility] URL: [email or web address] Date: 01/22/2021 Prepared by: Hilda Blades  Exercises Active Straight Leg Raise with Quad Set - 1-2 x daily - 7 x weekly - 3 sets - 5 reps Supine Bridge - 1-2 x daily - 7 x weekly - 3 sets - 5 reps Clamshell - 1-2 x daily - 7 x weekly - 3 sets - 10 reps Seated Long Arc Quad - 1-2 x daily - 7 x weekly - 3 sets - 10 reps Sit to Stand with Arms Crossed - 1-2 x daily - 7 x weekly - 3 sets - 5 reps Seated Hamstring Stretch - 1-2 x daily - 7 x weekly - 2 reps - 30 seconds hold Supine Calf Stretch with Strap - 1-2 x daily - 7 x weekly - 2 reps - 30 seconds hold

## 2021-01-22 NOTE — Therapy (Signed)
[place], [place], [address] Phone: [phone]   Fax:  [phone]  Physical Therapy Evaluation  Patient Details  Name: Sahib Pella MRN: [id number] Date of Birth: 12-08-1960 Referring Provider (PT): Charlott Rakes, MD   Encounter Date: 01/22/2021   PT End of Session - 01/22/21 1635     Visit Number 1    Number of Visits 6    Date for PT Re-Evaluation 03/05/21    Authorization Type UHC MCR    Progress Note Due on Visit 10    PT Start Time 6962    PT Stop Time 1500   patient spent 10 minutes using restroom during evaluation   PT Time Calculation (min) 45 min    Activity Tolerance Patient tolerated treatment well    Behavior During Therapy [facility] for tasks assessed/performed             Past Medical History:  Diagnosis Date   Anemia    Anxiety    Arthritis    left shoulder   Arthritis    Atherosclerosis of aorta (HCC)    Cardiomegaly    Chest pain    DATE UNKNOWN, C/O PERIODICALLY   Chest pain    Cocaine abuse ([place])    COPD (chronic obstructive pulmonary disease) ([place])    COPD exacerbation ([place]) 08/17/2016   Coronary artery disease    stent 02/22/17   Coronary artery disease    Dysrhythmia    ESRD (end stage renal disease) ([place])    ESRD (end stage renal disease) on dialysis ([place])    "[name]; M-W-F" (07/04/2017)   GERD (gastroesophageal reflux disease)    DATE UNKNOWN   GERD (gastroesophageal reflux disease)    Hemorrhoids    Hepatitis B    Hepatitis B, chronic (HCC)    Hepatitis C    History of kidney stones    Hyperkalemia    Hypertension    Kidney failure    Metabolic bone disease    Patient denies   Mitral stenosis    Myocardial infarction [facility])    Pneumonia    Pulmonary edema    Renal disorder    Solitary rectal ulcer syndrome 07/2017   at flex sig for rectal bleeding   Solitary rectal ulcer syndrome    Tubular adenoma of colon     Past Surgical  History:  Procedure Laterality Date   A/V FISTULAGRAM Left 05/26/2017   Procedure: A/V FISTULAGRAM;  Surgeon: Conrad Lansdale, MD;  Location: [place] [facility];  Service: Cardiovascular;  Laterality: Left;   A/V FISTULAGRAM Right 11/18/2017   Procedure: A/V FISTULAGRAM - Right Arm;  Surgeon: Elam Dutch, MD;  Location: [place] [facility];  Service: Cardiovascular;  Laterality: Right;   AMPUTATION Right 09/04/2020   Procedure: RIGHT INDEX AND RIGHT RING FINGER AMPUTATION DIGIT;  Surgeon: Leanora Cover, MD;  Location: [place];  Service: Orthopedics;  Laterality: Right;   AMPUTATION Right 11/13/2020   Procedure: RIGHT INDEX FINGER AMPUTATION AND RIGHT RING FINGER AMPUTATION;  Surgeon: Leanora Cover, MD;  Location: [place];  Service: Orthopedics;  Laterality: Right;   APPLICATION OF WOUND VAC Left 06/14/2017   Procedure: APPLICATION OF WOUND VAC;  Surgeon: Katha Cabal, MD;  Location: [facility];  Service: Vascular;  Laterality: Left;   AV FISTULA PLACEMENT  2012   BELIEVED WAS PLACED IN JUNE   AV FISTULA PLACEMENT Right 08/09/2017   Procedure: Creation Right arm ARTERIOVENOUS BRACHIOCEPOHALIC FISTULA;  Surgeon:  Elam Dutch, MD;  Location: [facility];  Service: Vascular;  Laterality: Right;   AV FISTULA PLACEMENT Right 11/22/2017   Procedure: INSERTION OF ARTERIOVENOUS (AV) GORE-TEX GRAFT RIGHT UPPER ARM;  Surgeon: Elam Dutch, MD;  Location: [place];  Service: Vascular;  Laterality: Right;   BIOPSY  01/25/2018   Procedure: BIOPSY;  Surgeon: Jerene Bears, MD;  Location: [place];  Service: Gastroenterology;;   BIOPSY  04/10/2019   Procedure: BIOPSY;  Surgeon: Jerene Bears, MD;  Location: [facility];  Service: Gastroenterology;;   COLONOSCOPY     COLONOSCOPY WITH PROPOFOL N/A 01/25/2018   Procedure: COLONOSCOPY WITH PROPOFOL;  Surgeon: Jerene Bears, MD;  Location: [place];  Service: Gastroenterology;  Laterality: N/A;   CORONARY STENT INTERVENTION N/A 02/22/2017   Procedure: CORONARY  STENT INTERVENTION;  Surgeon: Nigel Mormon, MD;  Location: [place] [facility];  Service: Cardiovascular;  Laterality: N/A;   ESOPHAGOGASTRODUODENOSCOPY (EGD) WITH PROPOFOL N/A 01/25/2018   Procedure: ESOPHAGOGASTRODUODENOSCOPY (EGD) WITH PROPOFOL;  Surgeon: Jerene Bears, MD;  Location: [place];  Service: Gastroenterology;  Laterality: N/A;   ESOPHAGOGASTRODUODENOSCOPY (EGD) WITH PROPOFOL N/A 04/10/2019   Procedure: ESOPHAGOGASTRODUODENOSCOPY (EGD) WITH PROPOFOL;  Surgeon: Jerene Bears, MD;  [facility]: [facility];  Service: Gastroenterology;  Laterality: N/A;   FLEXIBLE SIGMOIDOSCOPY N/A 07/15/2017   Procedure: FLEXIBLE SIGMOIDOSCOPY;  Surgeon: Carol Ada, MD;  Location: [place];  Service: Endoscopy;  Laterality: N/A;   HEMORRHOID BANDING     I & D EXTREMITY Left 06/01/2017   Procedure: IRRIGATION AND DEBRIDEMENT LEFT ARM HEMATOMA WITH LIGATION OF LEFT ARM AV FISTULA;  Surgeon: Elam Dutch, MD;  Location: [place];  Service: Vascular;  Laterality: Left;   I & D EXTREMITY Left 06/14/2017   Procedure: IRRIGATION AND DEBRIDEMENT EXTREMITY;  Surgeon: Katha Cabal, MD;  Location: [facility];  Service: Vascular;  Laterality: Left;   INSERTION OF DIALYSIS CATHETER  05/30/2017   INSERTION OF DIALYSIS CATHETER N/A 05/30/2017   Procedure: INSERTION OF DIALYSIS CATHETER;  Surgeon: Elam Dutch, MD;  Location: [place];  Service: Vascular;  Laterality: N/A;   IR PARACENTESIS  08/30/2017   IR PARACENTESIS  09/29/2017   IR PARACENTESIS  10/28/2017   IR PARACENTESIS  11/09/2017   IR PARACENTESIS  11/16/2017   IR PARACENTESIS  11/28/2017   IR PARACENTESIS  12/01/2017   IR PARACENTESIS  12/06/2017   IR PARACENTESIS  01/03/2018   IR PARACENTESIS  01/23/2018   IR PARACENTESIS  02/07/2018   IR PARACENTESIS  02/21/2018   IR PARACENTESIS  03/06/2018   IR PARACENTESIS  03/17/2018   IR PARACENTESIS  04/04/2018   IR PARACENTESIS  12/28/2018   IR PARACENTESIS  01/08/2019   IR PARACENTESIS  01/23/2019    IR PARACENTESIS  02/01/2019   IR PARACENTESIS  02/19/2019   IR PARACENTESIS  03/01/2019   IR PARACENTESIS  03/15/2019   IR PARACENTESIS  04/03/2019   IR PARACENTESIS  04/12/2019   IR PARACENTESIS  05/01/2019   IR PARACENTESIS  05/08/2019   IR PARACENTESIS  05/24/2019   IR PARACENTESIS  06/12/2019   IR PARACENTESIS  07/09/2019   IR PARACENTESIS  07/27/2019   IR PARACENTESIS  08/09/2019   IR PARACENTESIS  08/21/2019   IR PARACENTESIS  09/17/2019   IR PARACENTESIS  10/05/2019   IR PARACENTESIS  10/29/2019   IR PARACENTESIS  11/08/2019   IR PARACENTESIS  12/12/2019   IR PARACENTESIS  01/03/2020   IR PARACENTESIS  01/10/2020  IR PARACENTESIS  01/17/2020   IR PARACENTESIS  01/24/2020   IR PARACENTESIS  01/31/2020   IR PARACENTESIS  02/07/2020   IR PARACENTESIS  02/21/2020   IR PARACENTESIS  05/21/2020   IR PARACENTESIS  10/10/2020   IR PARACENTESIS  10/28/2020   IR PARACENTESIS  11/18/2020   IR PARACENTESIS  12/02/2020   IR PARACENTESIS  12/11/2020   IR PARACENTESIS  12/18/2020   IR PARACENTESIS  12/30/2020   IR PARACENTESIS  01/06/2021   IR PARACENTESIS  01/13/2021   IR PARACENTESIS  01/20/2021   IR RADIOLOGIST EVAL & MGMT  02/14/2018   IR RADIOLOGIST EVAL & MGMT  02/22/2019   LEFT HEART CATH AND CORONARY ANGIOGRAPHY N/A 02/22/2017   Procedure: LEFT HEART CATH AND CORONARY ANGIOGRAPHY;  Surgeon: Nigel Mormon, MD;  Location: [place] [facility];  Service: Cardiovascular;  Laterality: N/A;   LIGATION OF ARTERIOVENOUS  FISTULA Left 2/0/2542   Procedure: Plication of Left Arm Arteriovenous Fistula;  Surgeon: Elam Dutch, MD;  Location: [facility];  Service: Vascular;  Laterality: Left;   POLYPECTOMY     POLYPECTOMY  01/25/2018   Procedure: POLYPECTOMY;  Surgeon: Jerene Bears, MD;  Location: [place];  Service: Gastroenterology;;   REVISON OF ARTERIOVENOUS FISTULA Left 01/15/2375   Procedure: PLICATION OF DISTAL ANEURYSMAL SEGEMENT OF LEFT UPPER ARM ARTERIOVENOUS FISTULA;  Surgeon: Elam Dutch, MD;  Location:  [place];  Service: Vascular;  Laterality: Left;   REVISON OF ARTERIOVENOUS FISTULA Left 2/83/1517   Procedure: Plication of Left Upper Arm Fistula ;  Surgeon: Waynetta Sandy, MD;  Location: [place];  Service: Vascular;  Laterality: Left;   SKIN GRAFT SPLIT THICKNESS LEG / FOOT Left    SKIN GRAFT SPLIT THICKNESS LEFT ARM DONOR SITE: LEFT ANTERIOR THIGH   SKIN SPLIT GRAFT Left 07/04/2017   Procedure: SKIN GRAFT SPLIT THICKNESS LEFT ARM DONOR SITE: LEFT ANTERIOR THIGH;  Surgeon: Elam Dutch, MD;  Location: [place];  Service: Vascular;  Laterality: Left;   THROMBECTOMY W/ EMBOLECTOMY Left 06/05/2017   Procedure: EXPLORATION OF LEFT ARM FOR BLEEDING; OVERSEWED PROXIMAL FISTULA;  Surgeon: Angelia Mould, MD;  Location: [place];  Service: Vascular;  Laterality: Left;   WOUND EXPLORATION Left 06/03/2017   Procedure: WOUND EXPLORATION WITH WOUND VAC APPLICATION TO LEFT ARM;  Surgeon: Angelia Mould, MD;  Location: [place];  Service: Vascular;  Laterality: Left;    There were no vitals filed for this visit.    Subjective Assessment - 01/22/21 1614     Subjective Patient reports legs and knees "start bouncing" until he falls. Patient states he doesn't remember when this started but denies any recent falls. He reports that it's probably been about a month since he has fallen last. Patient reports he has trouble walking a lot and going up/down steps. He reports the knees just feel real weak, and he does use a cane when he feels like he needs it but not all the time.    Limitations Standing;Walking;House hold activities    How long can you stand comfortably? "can't walk far"    Patient Stated Goals Get legs stronger to stop from falling    Currently in Pain? No/denies                [facility] PT Assessment - 01/22/21 0001       Assessment   Medical Diagnosis Weakness of both lower extremities    Referring Provider (PT) Charlott Rakes, MD  Onset Date/Surgical Date --    unclear exact onset date per patient   Hand Dominance Right    Next MD Visit 06/11/2021    Prior Therapy No      Precautions   Precautions None      Restrictions   Weight Bearing Restrictions No      Balance Screen   Has the patient fallen in the past 6 months Yes    How many times? Patient unable to recall how many falls he has had, estimates 6-7 and has trouble getting up.    Has the patient had a decrease in activity level because of a fear of falling?  Yes    Is the patient reluctant to leave their home because of a fear of falling?  Yes      [place] residence    Living Arrangements Alone    Type of [place] to enter    Entrance Stairs-Number of Steps 2    Entrance Stairs-Rails Right    Home Layout One level      Prior Function   Level of Independence Independent    Vocation On disability    Leisure None reported      Cognition   Overall Cognitive Status Within Functional Limits for tasks assessed      Observation/Other Assessments   Observations Patient appears in no apparent distress    Focus on Therapeutic Outcomes (FOTO)  Unable to assess due to time constraints, patient needed to use the restroom during evaluation      Sensation   Light Touch Appears Intact      Coordination   Gross Motor Movements are Fluid and Coordinated Not tested      Functional Tests   Functional tests Sit to Stand      Sit to Stand   Comments Patient able to perform with use of UEs for support, increased forward trunk lean to gain momentum, decreased control with lowering      Posture/Postural Control   Posture Comments Rounded shoulder and forward head posture      ROM / Strength   AROM / PROM / Strength Strength      Strength   Strength Assessment Site Hip;Knee;Ankle    Right/Left Hip Right;Left    Right Hip Flexion 4-/5    Right Hip Extension 3/5    Right Hip ABduction 3/5    Left Hip Flexion 4-/5    Left  Hip Extension 3/5    Left Hip ABduction 3/5    Right/Left Knee Right;Left    Right Knee Flexion 4/5    Right Knee Extension 4/5    Left Knee Flexion 4/5    Left Knee Extension 4/5    Right/Left Ankle Right;Left    Right Ankle Dorsiflexion 4+/5    Right Ankle Plantar Flexion 4/5    Left Ankle Dorsiflexion 4+/5    Left Ankle Plantar Flexion 4/5      Flexibility   Soft Tissue Assessment /Muscle Length yes    Hamstrings Limited ~30 deg bilaterally      Palpation   Palpation comment Non TTP      Transfers   Five time sit to stand comments  33 seconds      Ambulation/Gait   Ambulation/Gait Yes    Ambulation/Gait Assistance 6: Modified independent (Device/Increase time)    Assistive device None    Gait Comments Patient with decreased gait speed, wide BOS and  toe out for stability                        Objective measurements completed on examination: See above findings.       [place] Adult PT Treatment/Exercise - 01/22/21 0001       Exercises   Exercises Knee/Hip      Knee/Hip Exercises: Stretches   Passive Hamstring Stretch 2 reps;30 seconds    Passive Hamstring Stretch Limitations seayed edge of mat    Gastroc Stretch 2 reps;30 seconds    Gastroc Stretch Limitations supine with strap      Knee/Hip Exercises: Seated   Long Arc Quad 10 reps    Sit to [employer] 5 reps;without UE support      Knee/Hip Exercises: Supine   Bridges 5 reps    Straight Leg Raises 5 reps      Knee/Hip Exercises: Sidelying   Clams x 10                    PT Education - 01/22/21 1610     Education Details Exam findings, POC, HEP    Person(s) Educated Patient    Methods Explanation;Demonstration;Verbal cues;Handout    Comprehension Verbalized understanding;Returned demonstration;Verbal cues required;Need further instruction              PT Short Term Goals - 01/22/21 1702       PT SHORT TERM GOAL #1   Title STG = LTG               PT Long Term  Goals - 01/22/21 1702       PT LONG TERM GOAL #1   Title Patient will be I with final HEP to maintain progress from PT    Time 6    Period Weeks    Status New    Target Date 03/05/21      PT LONG TERM GOAL #2   Title Patient will exhibit hip strength >/= 4/5 MMT and knee strength = 5/5 MMT to improve walking and stair negotiation    Time 6    Period Weeks    Status New    Target Date 03/05/21      PT LONG TERM GOAL #3   Title Patient will be able to perform 5xSTS in </= 15 seconds to indicate improved strength and reduced fall risk    Time 6    Period Weeks    Status New    Target Date 03/05/21      PT LONG TERM GOAL #4   Title Establish FOTO goal when assessed on 2nd visit    Time 6    Period Weeks    Status New    Target Date 03/05/21                    Plan - 01/22/21 1636     Clinical Impression Statement Patient presents to PT with gradual onset of leg weakness and frequent falls from no apparent mechanism or injury. Patient does exhibit gross hip and BLE strength deficit likely due to decreased activity level, resulting in gait and balance impairments. He demonstrates increased fall risk based on 5xSTS and history of previous falls. Patient was provided exercises to initiate strengthening for LEs and he would benefit from continued skilled PT to progress his strength and mobility in order to improve walking and reduce fall risk to maximize functional ability.    Personal Factors and Comorbidities  Fitness;Time since onset of injury/illness/exacerbation;Past/Current Experience;Comorbidity 3+    Comorbidities COPD, ESRD on dialysis, HTN, Ascites, cirrhosis, CKD, A-fib    Examination-Activity Limitations Locomotion Level;Transfers;Stairs;Stand;Lift;Carry;Dressing;Bathing    Examination-Participation Restrictions Meal Prep;Cleaning;Community Activity;Occupation;Shop;Laundry;Yard Work    [employer] Evolving/Moderate complexity    Clinical  Decision Making Moderate    Rehab Potential Fair    PT Frequency 1x / week    PT Duration 6 weeks    PT Treatment/Interventions ADLs/Self Care Home Management;Aquatic Therapy;Cryotherapy;Electrical Stimulation;Iontophoresis 4mg /ml Dexamethasone;Moist Heat;Traction;Ultrasound;Neuromuscular re-education;Balance training;Therapeutic exercise;Therapeutic activities;Functional mobility training;Stair training;Gait training;Patient/family education;Manual techniques;Dry needling;Passive range of motion;Taping;Vasopneumatic Device;Spinal Manipulations;Joint Manipulations    PT Next Visit Plan Assess FOTO, review HEP and progress PRN, progress general LE strengthening progressing to standing exercises, initiate balance training as able    PT Home Exercise Plan [facility]    Consulted and Agree with Plan of Care Patient             Patient will benefit from skilled therapeutic intervention in order to improve the following deficits and impairments:  Abnormal gait, Decreased range of motion, Difficulty walking, Decreased strength, Decreased balance, Impaired flexibility, Decreased activity tolerance  Visit Diagnosis: Muscle weakness (generalized)  Other abnormalities of gait and mobility  Repeated falls     Problem List Patient Active Problem List   Diagnosis Date Noted   Tylenol overdose 09/07/2020   DNR (do not resuscitate)    CAP (community acquired pneumonia) 02/09/2020   Leukocytosis 01/19/2020   Tobacco use 01/19/2020   Acute pulmonary edema ([place]) 12/21/2019   Acetaminophen overdose 10/27/2019   ESRD (end stage renal disease) ([place])    Goals of care, counseling/discussion    Hypoxia 10/04/2019   Advanced care planning/counseling discussion    Renal failure 09/26/2019   Dyspnea 06/09/2019   Acquired thrombophilia ([place]) 06/05/2019   A-fib ([place]) 05/30/2019   Atrial fibrillation with RVR ([place]) 05/29/2019   Melena    Pressure injury of skin 03/09/2019   Abdominal distention     Volume overload 12/28/2018   Sepsis ([place]) 09/12/2018   Coronary artery disease involving native coronary artery of native heart without angina pectoris 03/11/2018   Benign neoplasm of cecum    Benign neoplasm of ascending colon    Benign neoplasm of descending colon    Benign neoplasm of rectum    AF (paroxysmal atrial fibrillation) ([place]) 01/23/2018   Hx of colonic polyps 01/20/2018   End-stage renal disease on hemodialysis ([place]) 11/21/2017   GERD (gastroesophageal reflux disease) 11/16/2017   Decompensated hepatic cirrhosis ([place]) 11/15/2017   Palliative care by specialist    Hyponatremia 11/04/2017   SBP (spontaneous bacterial peritonitis) ([place]) 10/30/2017   Liver disease, chronic 10/30/2017   SOB (shortness of breath)    Abdominal pain 10/28/2017   Upper airway cough syndrome with flattening on f/v loop 10/13/17 c/w vcd 10/17/2017   Elevated diaphragm 10/13/2017   Ileus ([place]) 09/29/2017   QT prolongation 09/29/2017   Malnutrition of moderate degree 09/29/2017   Sinus congestion 09/03/2017   Symptomatic anemia 09/02/2017   Cirrhosis of liver with ascites ([place]) 09/02/2017   Left bundle branch block 09/02/2017   Mitral stenosis 09/02/2017   Hematochezia 07/15/2017   Wide-complex tachycardia ([place])    Endotracheally intubated    ESRD on dialysis ([place]) 07/04/2017   CKD (chronic kidney disease) stage V requiring chronic dialysis ([place]) 06/18/2017   History of Cocaine abuse ([place]) 06/18/2017   Hypertension 06/18/2017   Infection of AV graft for dialysis ([place]) 06/18/2017   Anxiety 06/18/2017  Anemia due to chronic kidney disease 06/18/2017   Atypical atrial flutter ([place]) 06/18/2017   Personality disorder ([place]) 06/13/2017   Cellulitis 06/12/2017   Adjustment disorder with mixed anxiety and depressed mood 06/10/2017   Suicidal ideation 06/10/2017   Arm wound, left, sequela 06/10/2017   Dyspnea on exertion 05/29/2017   Tachycardia 05/29/2017   Hyperkalemia 56/72/0919   Acute  metabolic encephalopathy    Anemia 04/23/2017   Ascites 04/23/2017   COPD (chronic obstructive pulmonary disease) ([place]) 04/23/2017   Acute on chronic respiratory failure with hypoxia ([place]) 03/25/2017   Arrhythmia 03/25/2017   COPD GOLD 0 with flattening on inps f/v  09/27/2016   Essential hypertension 09/27/2016   Fluid overload 08/30/2016   COPD exacerbation ([place]) 08/17/2016   Hypertensive urgency 08/17/2016   Problem with dialysis access ([place]) 07/23/2016   Chronic hepatitis B ([place]) 03/05/2014   Chronic hepatitis C without hepatic coma ([place]) 03/05/2014   Internal hemorrhoids with bleeding, swelling and itching 03/05/2014   Thrombocytopenia ([place]) 03/05/2014   Chest pain 02/27/2014   Alcohol abuse 04/14/2009   Nicotine dependence, cigarettes, uncomplicated 80/22/1798   GANGLION CYST 04/14/2009    Hilda Blades, PT, DPT, LAT, ATC 01/22/21  5:17 PM Phone: [phone] Fax: [facility] [address] [place], [place], [address] Phone: [phone]   Fax:  [phone]  Name: Peder Allums MRN: [id number] Date of Birth: 1960/10/18

## 2021-01-25 IMAGING — US IR PARACENTESIS
1 series · 3 of 3 positions shown · non-contrast
Comparison: none

INDICATION: Patient with history of hepatitis-C, cirrhosis, end-stage renal
disease on hemodialysis and recurrent ascites. Request IR for
diagnostic and therapeutic paracentesis.

[Series 1: (id) · 3 of 3 slices shown]
[im 1/3]
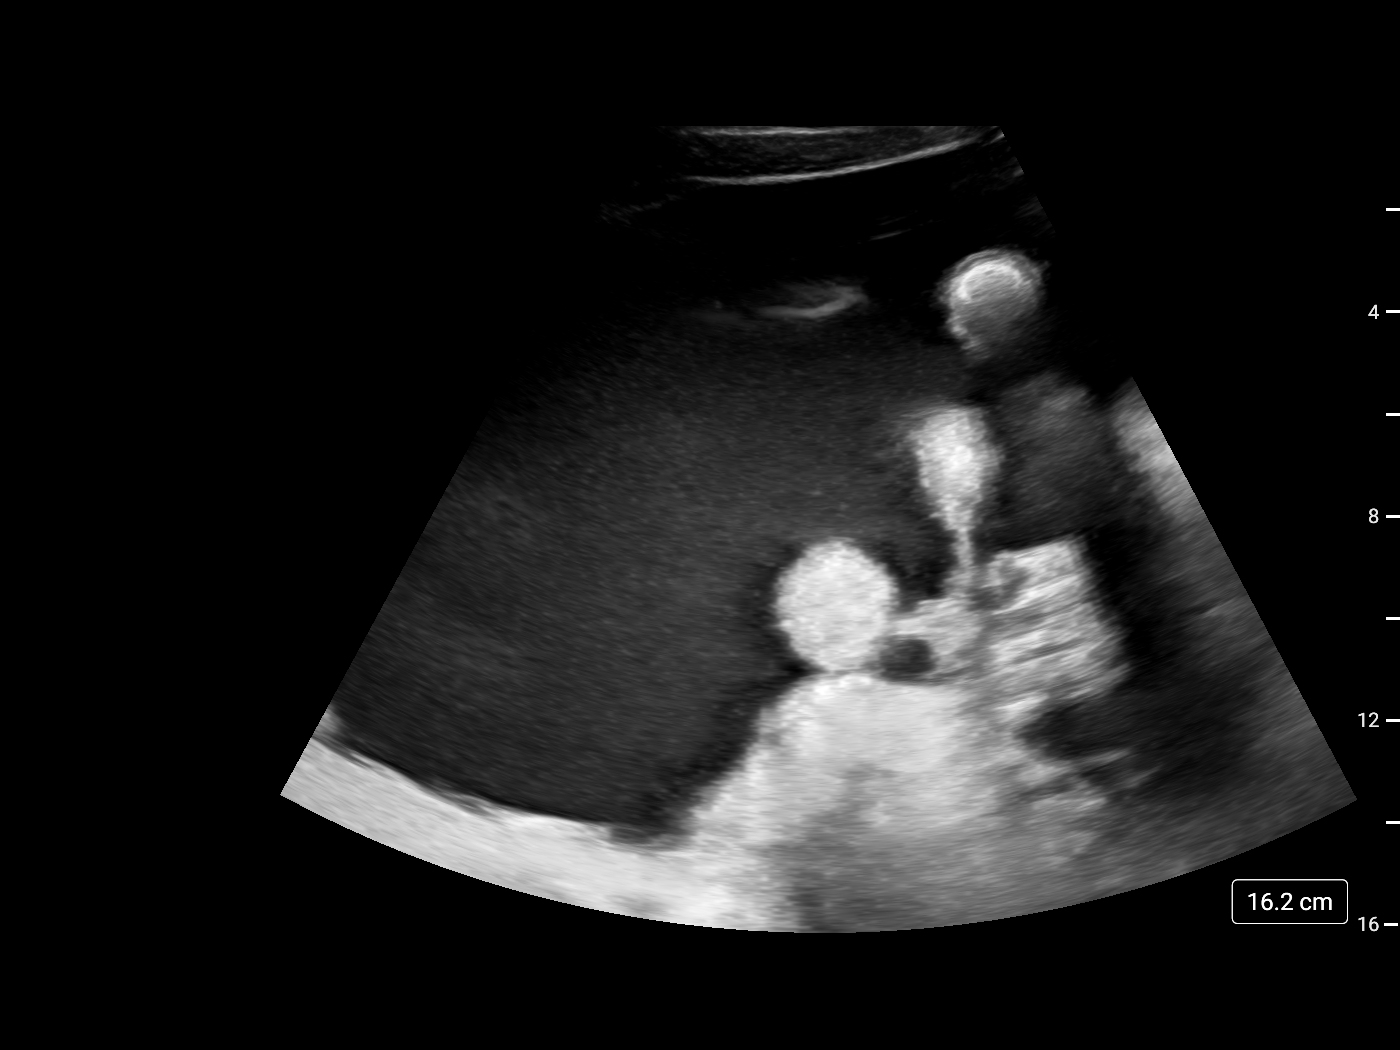
[im 2/3]
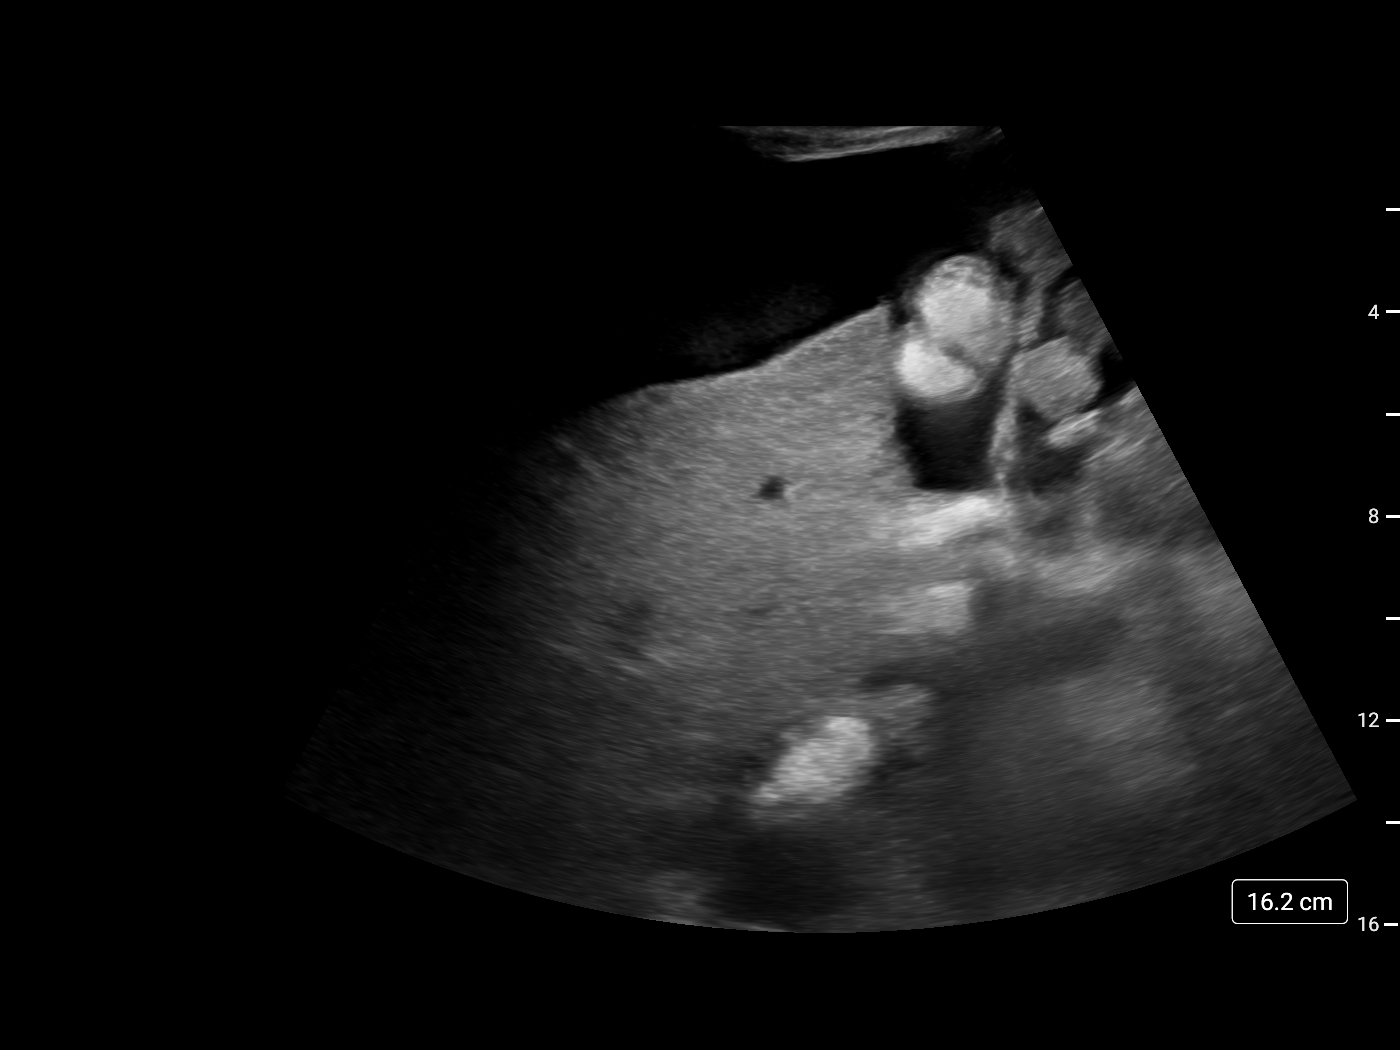
[im 3/3]
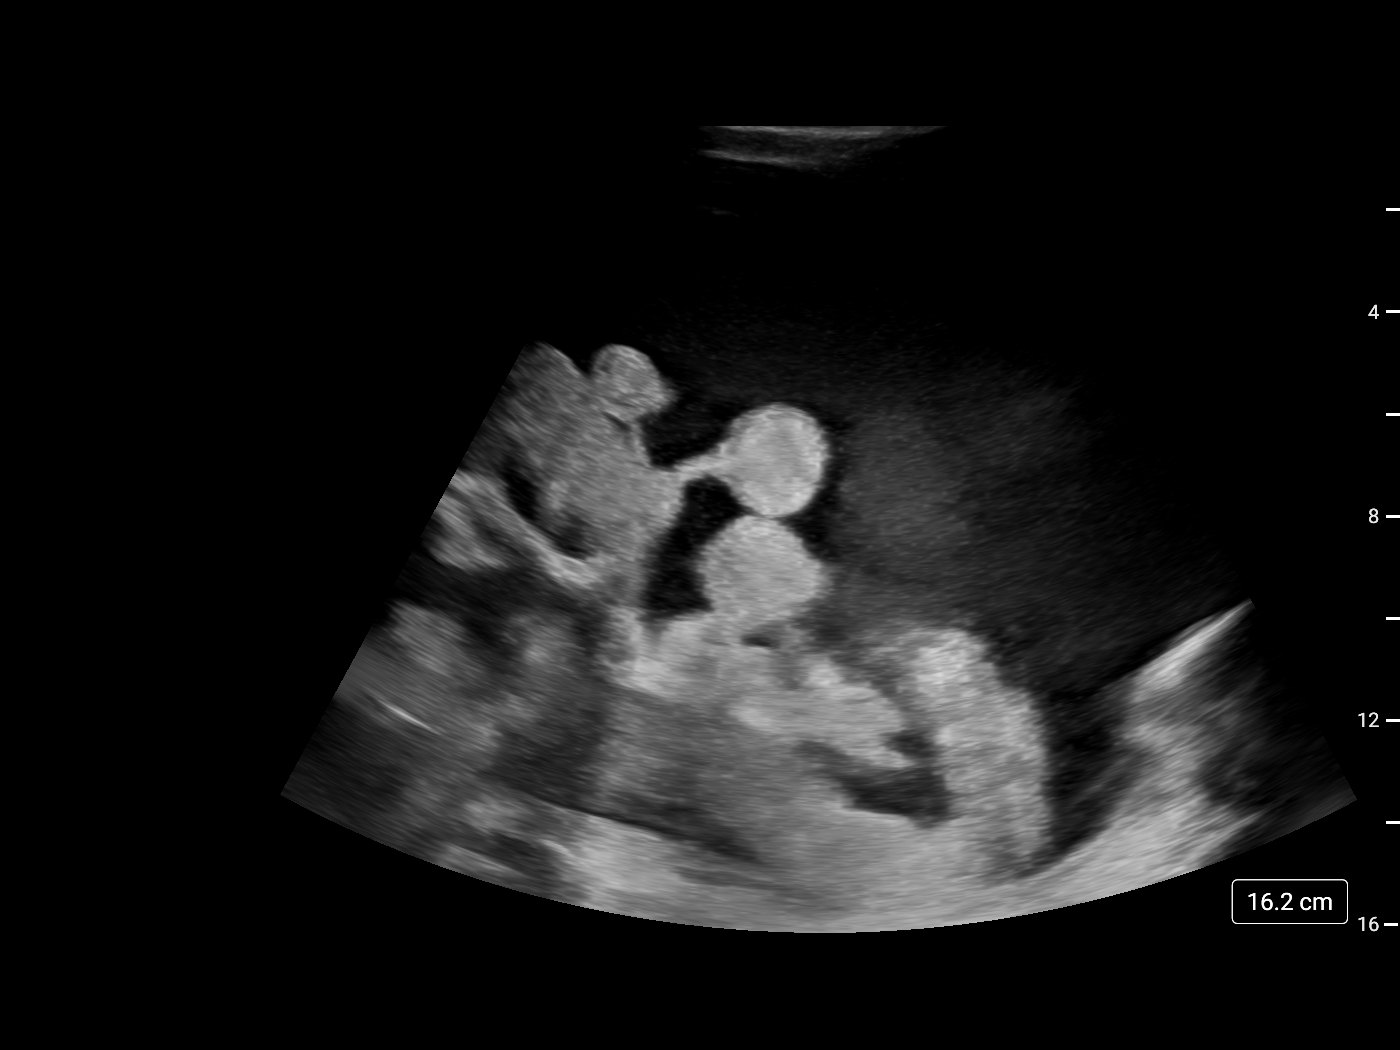

[3 of 3 positions shown; findings below may reference images not displayed]

EXAM:
ULTRASOUND GUIDED DIAGNOSTIC AND THERAPEUTIC PARACENTESIS

MEDICATIONS:
10 mL 1% lidocaine

COMPLICATIONS:
None immediate.

PROCEDURE:
Informed written consent was obtained from the patient after a
discussion of the risks, benefits and alternatives to treatment. A
timeout was performed prior to the initiation of the procedure.

Initial ultrasound scanning demonstrates a moderate amount of
ascites within the left lower abdominal quadrant. The left lower
abdomen was prepped and draped in the usual sterile fashion. 1%
lidocaine was used for local anesthesia.

Following this, a 19 gauge, 7-cm, Yueh catheter was introduced. An
ultrasound image was saved for documentation purposes. The
paracentesis was performed. The catheter was removed and a dressing
was applied. The patient tolerated the procedure well without
immediate post procedural complication.
FINDINGS: A total of approximately 3.2 L of serosanguineous fluid was removed.
Samples were sent to the laboratory as requested by the clinical
team.
IMPRESSION: Successful ultrasound-guided paracentesis yielding 3.2 liters of
peritoneal fluid.

Read by Calson, [place]

## 2021-01-26 ENCOUNTER — Other Ambulatory Visit: Payer: Self-pay | Admitting: Nephrology

## 2021-01-26 DIAGNOSIS — K7469 Other cirrhosis of liver: Secondary | ICD-10-CM

## 2021-01-27 ENCOUNTER — Other Ambulatory Visit: Payer: Self-pay

## 2021-01-27 ENCOUNTER — Ambulatory Visit (HOSPITAL_COMMUNITY)
Admission: RE | Admit: 2021-01-27 | Discharge: 2021-01-27 | Disposition: A | Discharge status: Home / Self Care | Payer: Medicare Other | Source: Ambulatory Visit | Attending: Family Medicine | Admitting: Family Medicine

## 2021-01-27 DIAGNOSIS — R188 Other ascites: Secondary | ICD-10-CM | POA: Diagnosis not present

## 2021-01-27 DIAGNOSIS — K7460 Unspecified cirrhosis of liver: Secondary | ICD-10-CM | POA: Insufficient documentation

## 2021-01-27 HISTORY — PX: IR PARACENTESIS: IMG2679

## 2021-01-27 IMAGING — DX DG CHEST 1V PORT
1 series · 1 of 1 positions shown · non-contrast
Comparison: 07/23/2019

CLINICAL DATA: Ventricular tachycardia

EXAM:
PORTABLE CHEST 1 VIEW

[chest ap]
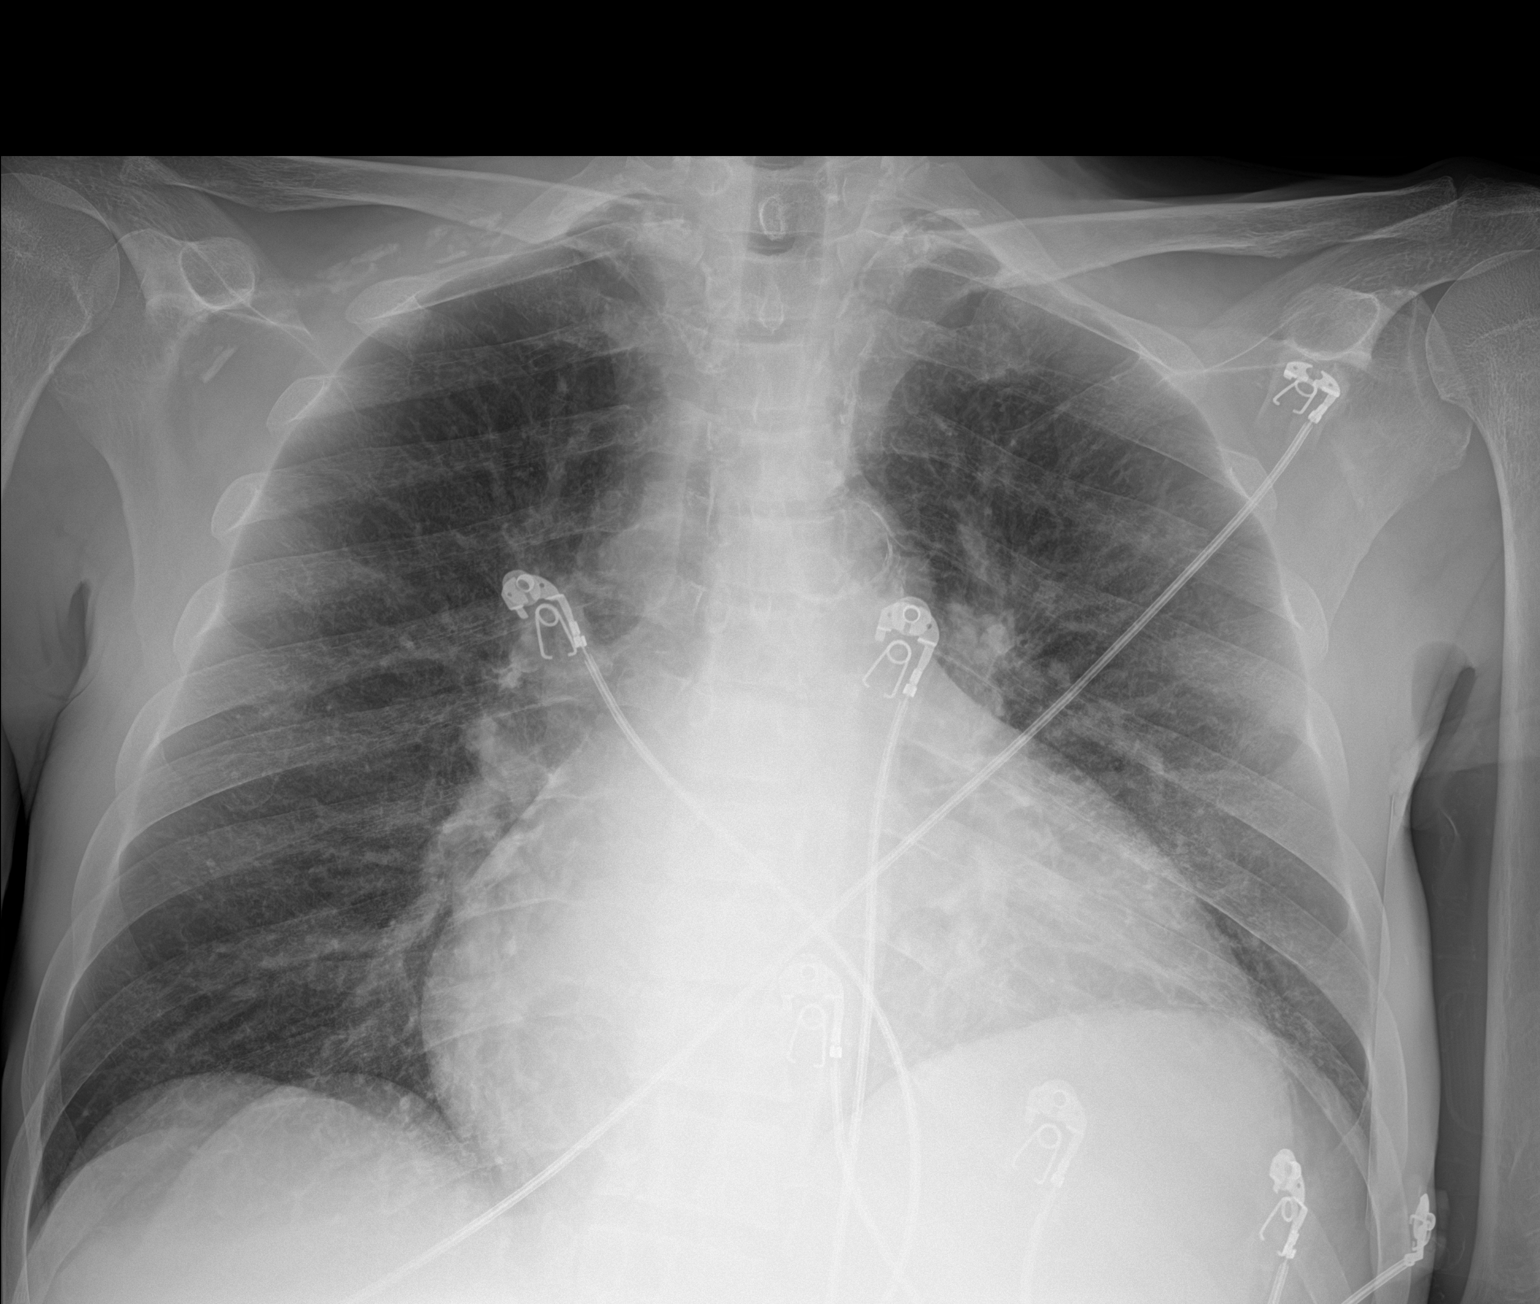

[1 of 1 positions shown; findings below may reference images not displayed]

FINDINGS: Enlarged cardiac silhouette similar prior. Mild central venous
congestion. No effusion, infiltrate pneumothorax.
IMPRESSION: Cardiomegaly and mild venous congestion.

## 2021-01-27 MED ORDER — LIDOCAINE HCL 1 % IJ SOLN
INTRAMUSCULAR | Status: AC
  Filled 2021-01-27: qty 20

## 2021-01-29 ENCOUNTER — Telehealth: Payer: Self-pay | Admitting: Physical Therapy

## 2021-01-29 ENCOUNTER — Ambulatory Visit: Payer: Medicare Other | Admitting: Student

## 2021-01-29 ENCOUNTER — Ambulatory Visit: Payer: Medicare Other | Admitting: Physical Therapy

## 2021-01-29 ENCOUNTER — Other Ambulatory Visit: Payer: Self-pay

## 2021-01-29 ENCOUNTER — Encounter: Payer: Self-pay | Admitting: Student

## 2021-01-29 VITALS — BP 95/57 | HR 95 | Temp 97.8°F | Ht 69.0 in | Wt 157.0 lb

## 2021-01-29 DIAGNOSIS — I447 Left bundle-branch block, unspecified: Secondary | ICD-10-CM

## 2021-01-29 DIAGNOSIS — I480 Paroxysmal atrial fibrillation: Secondary | ICD-10-CM

## 2021-01-29 DIAGNOSIS — Z5181 Encounter for therapeutic drug level monitoring: Secondary | ICD-10-CM

## 2021-01-29 DIAGNOSIS — I2781 Cor pulmonale (chronic): Secondary | ICD-10-CM

## 2021-01-29 MED ORDER — METOPROLOL SUCCINATE ER 25 MG PO TB24: 12.5000 mg | ORAL_TABLET | Freq: Every day | ORAL | 3 refills | Status: AC

## 2021-01-29 NOTE — Telephone Encounter (Signed)
Attempted to contact patient due to missed PT appointment. No answer and unable to leave VM due to mailbox being full.  Hilda Blades, PT, DPT, LAT, ATC 01/29/21  4:08 PM Phone: [phone] Fax: [phone]

## 2021-01-29 NOTE — Progress Notes (Signed)
Primary Physician/Referring:  Charlott Rakes, MD  Patient ID: [name], male    DOB: [birth year], 59 y.o.   MRN: [id number]  Chief Complaint  Patient presents with   Atrial Fibrillation   Follow-up   Fatigue    HPI:    [name]  is a 60 y.o. male  with very complex medical problems including severe COPD and chronic cor pulmonale with pulmonary hypertension, coronary artery disease s/p stenting to mid RCA in 2018, paroxysmal atrial fibrillation with RVR, hypertensive cardiomyopathy, dietary and medication noncompliance, end-stage renal disease on hemodialysis, chronic hepatitis B and C with cirrhosis of the liver and ascites,  tobacco use, smokes 1/2 ppd, h/o marijuana use and prior cocaine use quit in 2017. Not on anticoagulation due to cirrhosis of liver and non-compliance with Warfarin.   He has had multiple ER visits for A fib with RVR, near syncope, shortness of breath, acute diastolic heart failure and hyperkalemia.  He has also been in the hospital with acute respiratory distress and pulmonary edema due to missed dialysis.  Patient is also status post amputation of right index and right ring fingers.  Patient now presents for follow-up.  Unfortunately patient did not bring medications with him again to today's office visit, therefore medication reconciliation is difficult.  Patient does report he has been taking metoprolol succinate 25 mg once daily. Denies chest pain, palpitations, dizziness, syncope, near syncope.  He denies orthopnea, PND, leg edema.  Past Medical History:  Diagnosis Date   Anemia    Anxiety    Arthritis    left shoulder   Arthritis    Atherosclerosis of aorta (HCC)    Cardiomegaly    Chest pain    DATE UNKNOWN, C/O PERIODICALLY   Chest pain    Cocaine abuse (HCC)    COPD (chronic obstructive pulmonary disease) (HCC)    COPD exacerbation ([place]) 08/17/2016   Coronary artery disease    stent 02/22/17   Coronary artery disease     Dysrhythmia    ESRD (end stage renal disease) ([place])    ESRD (end stage renal disease) on dialysis ([place])    "[name]; M-W-F" (07/04/2017)   GERD (gastroesophageal reflux disease)    DATE UNKNOWN   GERD (gastroesophageal reflux disease)    Hemorrhoids    Hepatitis B    Hepatitis B, chronic (HCC)    Hepatitis C    History of kidney stones    Hyperkalemia    Hypertension    Kidney failure    Metabolic bone disease    Patient denies   Mitral stenosis    Myocardial infarction [facility])    Pneumonia    Pulmonary edema    Renal disorder    Solitary rectal ulcer syndrome 07/2017   at flex sig for rectal bleeding   Solitary rectal ulcer syndrome    Tubular adenoma of colon    Past Surgical History:  Procedure Laterality Date   A/V FISTULAGRAM Left 05/26/2017   Procedure: A/V FISTULAGRAM;  Surgeon: Conrad Belle Chasse, MD;  Location: [place] [facility];  Service: Cardiovascular;  Laterality: Left;   A/V FISTULAGRAM Right 11/18/2017   Procedure: A/V FISTULAGRAM - Right Arm;  Surgeon: Elam Dutch, MD;  Location: [place] [facility];  Service: Cardiovascular;  Laterality: Right;   AMPUTATION Right 09/04/2020   Procedure: RIGHT INDEX AND RIGHT RING FINGER AMPUTATION DIGIT;  Surgeon: Leanora Cover, MD;  Location: [place];  Service: Orthopedics;  Laterality: Right;   AMPUTATION  Right 11/13/2020   Procedure: RIGHT INDEX FINGER AMPUTATION AND RIGHT RING FINGER AMPUTATION;  Surgeon: Leanora Cover, MD;  Location: [place];  Service: Orthopedics;  Laterality: Right;   APPLICATION OF WOUND VAC Left 06/14/2017   Procedure: APPLICATION OF WOUND VAC;  Surgeon: Katha Cabal, MD;  Location: [facility];  Service: Vascular;  Laterality: Left;   AV FISTULA PLACEMENT  2012   BELIEVED WAS PLACED IN JUNE   AV FISTULA PLACEMENT Right 08/09/2017   Procedure: Creation Right arm ARTERIOVENOUS BRACHIOCEPOHALIC FISTULA;  Surgeon: Elam Dutch, MD;  Location: [facility];  Service: Vascular;  Laterality: Right;   AV FISTULA  PLACEMENT Right 11/22/2017   Procedure: INSERTION OF ARTERIOVENOUS (AV) GORE-TEX GRAFT RIGHT UPPER ARM;  Surgeon: Elam Dutch, MD;  Location: [place];  Service: Vascular;  Laterality: Right;   BIOPSY  01/25/2018   Procedure: BIOPSY;  Surgeon: Jerene Bears, MD;  Location: [place];  Service: Gastroenterology;;   BIOPSY  04/10/2019   Procedure: BIOPSY;  Surgeon: Jerene Bears, MD;  Location: [facility];  Service: Gastroenterology;;   COLONOSCOPY     COLONOSCOPY WITH PROPOFOL N/A 01/25/2018   Procedure: COLONOSCOPY WITH PROPOFOL;  Surgeon: Jerene Bears, MD;  Location: [place];  Service: Gastroenterology;  Laterality: N/A;   CORONARY STENT INTERVENTION N/A 02/22/2017   Procedure: CORONARY STENT INTERVENTION;  Surgeon: Nigel Mormon, MD;  Location: [place] [facility];  Service: Cardiovascular;  Laterality: N/A;   ESOPHAGOGASTRODUODENOSCOPY (EGD) WITH PROPOFOL N/A 01/25/2018   Procedure: ESOPHAGOGASTRODUODENOSCOPY (EGD) WITH PROPOFOL;  Surgeon: Jerene Bears, MD;  Location: [place];  Service: Gastroenterology;  Laterality: N/A;   ESOPHAGOGASTRODUODENOSCOPY (EGD) WITH PROPOFOL N/A 04/10/2019   Procedure: ESOPHAGOGASTRODUODENOSCOPY (EGD) WITH PROPOFOL;  Surgeon: Jerene Bears, MD;  Location: [facility];  Service: Gastroenterology;  Laterality: N/A;   FLEXIBLE SIGMOIDOSCOPY N/A 07/15/2017   Procedure: FLEXIBLE SIGMOIDOSCOPY;  Surgeon: Carol Ada, MD;  Location: [place];  Service: Endoscopy;  Laterality: N/A;   HEMORRHOID BANDING     I & D EXTREMITY Left 06/01/2017   Procedure: IRRIGATION AND DEBRIDEMENT LEFT ARM HEMATOMA WITH LIGATION OF LEFT ARM AV FISTULA;  Surgeon: Elam Dutch, MD;  Location: [place];  Service: Vascular;  Laterality: Left;   I & D EXTREMITY Left 06/14/2017   Procedure: IRRIGATION AND DEBRIDEMENT EXTREMITY;  Surgeon: Katha Cabal, MD;  Location: [facility];  Service: Vascular;  Laterality: Left;   INSERTION OF DIALYSIS CATHETER  05/30/2017    INSERTION OF DIALYSIS CATHETER N/A 05/30/2017   Procedure: INSERTION OF DIALYSIS CATHETER;  Surgeon: Elam Dutch, MD;  Location: [place];  Service: Vascular;  Laterality: N/A;   IR PARACENTESIS  08/30/2017   IR PARACENTESIS  09/29/2017   IR PARACENTESIS  10/28/2017   IR PARACENTESIS  11/09/2017   IR PARACENTESIS  11/16/2017   IR PARACENTESIS  11/28/2017   IR PARACENTESIS  12/01/2017   IR PARACENTESIS  12/06/2017   IR PARACENTESIS  01/03/2018   IR PARACENTESIS  01/23/2018   IR PARACENTESIS  02/07/2018   IR PARACENTESIS  02/21/2018   IR PARACENTESIS  03/06/2018   IR PARACENTESIS  03/17/2018   IR PARACENTESIS  04/04/2018   IR PARACENTESIS  12/28/2018   IR PARACENTESIS  01/08/2019   IR PARACENTESIS  01/23/2019   IR PARACENTESIS  02/01/2019   IR PARACENTESIS  02/19/2019   IR PARACENTESIS  03/01/2019   IR PARACENTESIS  03/15/2019   IR PARACENTESIS  04/03/2019   IR PARACENTESIS  04/12/2019  IR PARACENTESIS  05/01/2019   IR PARACENTESIS  05/08/2019   IR PARACENTESIS  05/24/2019   IR PARACENTESIS  06/12/2019   IR PARACENTESIS  07/09/2019   IR PARACENTESIS  07/27/2019   IR PARACENTESIS  08/09/2019   IR PARACENTESIS  08/21/2019   IR PARACENTESIS  09/17/2019   IR PARACENTESIS  10/05/2019   IR PARACENTESIS  10/29/2019   IR PARACENTESIS  11/08/2019   IR PARACENTESIS  12/12/2019   IR PARACENTESIS  01/03/2020   IR PARACENTESIS  01/10/2020   IR PARACENTESIS  01/17/2020   IR PARACENTESIS  01/24/2020   IR PARACENTESIS  01/31/2020   IR PARACENTESIS  02/07/2020   IR PARACENTESIS  02/21/2020   IR PARACENTESIS  05/21/2020   IR PARACENTESIS  10/10/2020   IR PARACENTESIS  10/28/2020   IR PARACENTESIS  11/18/2020   IR PARACENTESIS  12/02/2020   IR PARACENTESIS  12/11/2020   IR PARACENTESIS  12/18/2020   IR PARACENTESIS  12/30/2020   IR PARACENTESIS  01/06/2021   IR PARACENTESIS  01/13/2021   IR PARACENTESIS  01/20/2021   IR PARACENTESIS  01/27/2021   IR RADIOLOGIST EVAL & MGMT  02/14/2018   IR RADIOLOGIST EVAL & MGMT  02/22/2019   LEFT HEART  CATH AND CORONARY ANGIOGRAPHY N/A 02/22/2017   Procedure: LEFT HEART CATH AND CORONARY ANGIOGRAPHY;  Surgeon: Nigel Mormon, MD;  Location: [place] [facility];  Service: Cardiovascular;  Laterality: N/A;   LIGATION OF ARTERIOVENOUS  FISTULA Left 11/09/6999   Procedure: Plication of Left Arm Arteriovenous Fistula;  Surgeon: Elam Dutch, MD;  Location: [facility];  Service: Vascular;  Laterality: Left;   POLYPECTOMY     POLYPECTOMY  01/25/2018   Procedure: POLYPECTOMY;  Surgeon: Jerene Bears, MD;  Location: [place];  Service: Gastroenterology;;   REVISON OF ARTERIOVENOUS FISTULA Left 7/49/4496   Procedure: PLICATION OF DISTAL ANEURYSMAL SEGEMENT OF LEFT UPPER ARM ARTERIOVENOUS FISTULA;  Surgeon: Elam Dutch, MD;  Location: [place];  Service: Vascular;  Laterality: Left;   REVISON OF ARTERIOVENOUS FISTULA Left 7/59/1638   Procedure: Plication of Left Upper Arm Fistula ;  Surgeon: Waynetta Sandy, MD;  Location: [place];  Service: Vascular;  Laterality: Left;   SKIN GRAFT SPLIT THICKNESS LEG / FOOT Left    SKIN GRAFT SPLIT THICKNESS LEFT ARM DONOR SITE: LEFT ANTERIOR THIGH   SKIN SPLIT GRAFT Left 07/04/2017   Procedure: SKIN GRAFT SPLIT THICKNESS LEFT ARM DONOR SITE: LEFT ANTERIOR THIGH;  Surgeon: Elam Dutch, MD;  Location: [place];  Service: Vascular;  Laterality: Left;   THROMBECTOMY W/ EMBOLECTOMY Left 06/05/2017   Procedure: EXPLORATION OF LEFT ARM FOR BLEEDING; OVERSEWED PROXIMAL FISTULA;  Surgeon: Angelia Mould, MD;  Location: [place];  Service: Vascular;  Laterality: Left;   WOUND EXPLORATION Left 06/03/2017   Procedure: WOUND EXPLORATION WITH WOUND VAC APPLICATION TO LEFT ARM;  Surgeon: Angelia Mould, MD;  Location: [facility];  Service: Vascular;  Laterality: Left;   Family History  Problem Relation Age of Onset   Heart disease Mother    Lung cancer Mother    Heart disease Father    Malignant hyperthermia Father    COPD Father    Throat cancer Sister     Esophageal cancer Sister    Hypertension Other    COPD Other    Throat cancer Sister    Colon cancer Neg Hx    Colon polyps Neg Hx    Rectal cancer Neg Hx  Stomach cancer Neg Hx     Social History   Tobacco Use   Smoking status: Not on file   Smokeless tobacco: Never  Substance Use Topics   Alcohol use: Not Currently    Comment: quit drinking in 2017   Marital Status: Single   ROS  Review of Systems  Constitutional: Negative for malaise/fatigue and weight gain.  Cardiovascular:  Positive for dyspnea on exertion (chronic, stable ). Negative for chest pain, claudication, leg swelling, near-syncope, orthopnea, palpitations, paroxysmal nocturnal dyspnea and syncope.  Respiratory:  Negative for shortness of breath and wheezing.   Hematologic/Lymphatic: Does not bruise/bleed easily.  Gastrointestinal:  Negative for hematochezia, melena and nausea.  Neurological:  Negative for dizziness and weakness.  Objective  Blood pressure (!) 95/57, pulse 95, temperature 97.8 F (36.6 C), height 5\' 9"  (1.753 m), weight 157 lb (71.2 kg).  Vitals with BMI 01/29/2021 01/17/2021 01/17/2021  Height 5\' 9"  - -  Weight 157 lbs - -  BMI 27.25 - -  Systolic 95 366 92  Diastolic 57 93 79  Pulse 95 - 56  Some encounter information is confidential and restricted. Go to Review Flowsheets activity to see all data.     Physical Exam Vitals reviewed.  Constitutional:      General: He is not in acute distress.    Appearance: He is ill-appearing.     Comments: Appears older than stated age.  Cardiovascular:     Rate and Rhythm: Normal rate. Rhythm irregularly irregular.     Heart sounds: Murmur heard.  Early systolic murmur is present with a grade of 2/6 at the upper right sternal border.  Low-pitched rumbling crescendo presystolic murmur is present with a grade of 3/4 at the apex.     Comments: S1 normal and S2 loud. No murmur. No JVD. No edema.  Right arm AV graft noted Pulmonary:     Effort:  Pulmonary effort is normal. No accessory muscle usage.     Breath sounds: Normal breath sounds.  Musculoskeletal:     Right lower leg: No edema.     Left lower leg: No edema.   Laboratory examination:   Recent Labs    02/17/20 1208 02/18/20 0002 02/18/20 0215 05/19/20 0934 09/20/20 1505 11/13/20 0935 11/17/20 1115 01/17/21 1548  NA 135 131* 136   < > 136 134* 136 131*  K 6.7* 6.5* 3.7   < > 4.0 3.7 4.6 4.5  CL 92* 90* 96*   < > 93* 95* 94* 90*  CO2 28 26 28    < > 27  --  27 28  GLUCOSE 105* 108* 109*   < > 126* 87 97 136*  BUN 40* 49* 16   < > 17 15 42* 19  CREATININE 5.89* 6.37* 2.90*   < > 6.52* 5.60* 9.47* 6.75*  CALCIUM 9.6 9.8 9.2   < > 8.4*  --  9.4 8.0*  GFRNONAA 10* 9* 23*   < > 9*  --  6* 9*  GFRAA 11* 10* 27*  --   --   --   --   --    < > = values in this interval not displayed.   estimated creatinine clearance is 11.9 mL/min (A) (by C-G formula based on SCr of 6.75 mg/dL (H)).  CMP Latest Ref Rng & Units 01/17/2021 11/17/2020 11/13/2020  Glucose 70 - 99 mg/dL 136(H) 97 87  BUN 6 - 20 mg/dL 19 42(H) 15  Creatinine 0.61 - 1.24 mg/dL 6.75(H) 9.47(H)  5.60(H)  Sodium 135 - 145 mmol/L 131(L) 136 134(L)  Potassium 3.5 - 5.1 mmol/L 4.5 4.6 3.7  Chloride 98 - 111 mmol/L 90(L) 94(L) 95(L)  CO2 22 - 32 mmol/L 28 27 -  Calcium 8.9 - 10.3 mg/dL 8.0(L) 9.4 -  Total Protein 6.5 - 8.1 g/dL - 7.1 -  Total Bilirubin 0.3 - 1.2 mg/dL - 0.9 -  Alkaline Phos 38 - 126 U/L - 210(H) -  AST 15 - 41 U/L - 29 -  ALT 0 - 44 U/L - 6 -   CBC Latest Ref Rng & Units 01/17/2021 11/17/2020 11/13/2020  WBC 4.0 - 10.5 K/uL 7.2 8.6 -  Hemoglobin 13.0 - 17.0 g/dL 11.3(L) 10.7(L) 10.9(L)  Hematocrit 39.0 - 52.0 % 32.3(L) 31.4(L) 32.0(L)  Platelets 150 - 400 K/uL 163 176 -   Lipid Panel     Component Value Date/Time   CHOL 102 09/07/2020 2340   TRIG 175 (H) 09/07/2020 2340   HDL 26 (L) 09/07/2020 2340   CHOLHDL 3.9 09/07/2020 2340   VLDL 35 09/07/2020 2340   LDLCALC 41 09/07/2020 2340    HEMOGLOBIN A1C Lab Results  Component Value Date   HGBA1C 4.8 01/26/2020   MPG 91.06 01/26/2020   TSH No results for input(s): TSH in the last 8760 hours.  Lipid Panel Recent Labs    02/18/20 0001 09/07/20 2340  CHOL 140 102  TRIG 107 175*  LDLCALC 81 41  VLDL 21 35  HDL 38* 26*  CHOLHDL 3.7 3.9    External labs:   03/11/2018: TSH 1.32 normal Allergies   Allergies  Allergen Reactions   Tramadol Itching and Other (See Comments)    Other reaction(s): Unknown   Grass Extracts [Gramineae Pollens]     Other reaction(s): Sneezing   Morphine And Related Other (See Comments)    Stomach pain   Pollen Extract Other (See Comments)    Other reaction(s): Sneezing (finding)   Acetaminophen Nausea Only    Stomach ache    Aspirin Itching and Other (See Comments)    STOMACH PAIN  Other reaction(s): Unknown   Clonidine Derivatives Itching      Medications Prior to Visit:   Outpatient Medications Prior to Visit  Medication Sig Dispense Refill   acetaminophen (TYLENOL) 500 MG tablet Take 500-1,000 mg by mouth every 6 (six) hours as needed (pain.).     albuterol (VENTOLIN HFA) 108 (90 Base) MCG/ACT inhaler INHALE 2 PUFFS BY MOUTH EVERY 4 HOURS AS NEEDED FOR WHEEZE OR FOR SHORTNESS OF BREATH 8.5 each 0   amiodarone (PACERONE) 200 MG tablet TAKE 1 TABLET BY MOUTH EVERY DAY (Patient taking differently: Take 200 mg by mouth in the morning.) 90 tablet 1   amiodarone (PACERONE) 200 MG tablet Take 200 mg by mouth daily.     cetirizine (ZYRTEC) 10 MG tablet Take 10 mg by mouth daily as needed for allergies.     cinacalcet (SENSIPAR) 30 MG tablet TAKE 2 TABLETS (60 MG TOTAL) BY MOUTH EVERY MONDAY, WEDNESDAY, AND FRIDAY WITH HEMODIALYSIS. 60 tablet 0   diltiazem (CARDIZEM CD) 120 MG 24 hr capsule TAKE 1 CAPSULE BY MOUTH EVERY DAY 90 capsule 3   doxycycline (VIBRAMYCIN) 100 MG capsule Take 100 mg by mouth 2 (two) times daily.     gabapentin (NEURONTIN) 300 MG capsule TAKE 1 CAPSULE BY  MOUTH IN THE MORNING AND AT BEDTIME 60 capsule 3   lactulose (CHRONULAC) 10 GM/15ML solution Take 30 mLs (20 g total) by mouth daily. (  Patient taking differently: Take 20 g by mouth daily as needed (constipation).) 236 mL 0   lidocaine-prilocaine (EMLA) cream Apply 1 application topically every Monday, Wednesday, and Friday with hemodialysis. Applied sparingly for port access     oxyCODONE (ROXICODONE) 5 MG immediate release tablet 1 tab PO q6 hours prn pain 20 tablet 0   Oxycodone HCl 10 MG TABS TAKE 1 TABLET (10 MG TOTAL) BY MOUTH THREE TIMES DAILY AS NEEDED PAIN. (Patient taking differently: Take 10 mg by mouth 3 (three) times daily as needed (pain).) 10 tablet 0   Oxycodone HCl 10 MG TABS Take 10 mg by mouth 3 (three) times daily as needed. pain     pantoprazole (PROTONIX) 40 MG tablet Take 40 mg by mouth daily.     polyethylene glycol (MIRALAX / GLYCOLAX) 17 g packet Take 17 g by mouth daily as needed (constipation).     sevelamer carbonate (RENVELA) 800 MG tablet Take 800 mg by mouth 3 (three) times daily with meals.     ondansetron (ZOFRAN) 4 MG tablet TAKE 1 TABLET BY MOUTH EVERY 8 HOURS AS NEEDED FOR NAUSEA AND VOMITING 30 tablet 0   cetirizine (ZYRTEC) 10 MG tablet Take 10 mg by mouth daily.     cinacalcet (SENSIPAR) 30 MG tablet Take 60 mg by mouth every Monday, Wednesday, and Friday. 2 tablets Monday Wednesday Friday. Given at dialysis     diltiazem (CARDIZEM SR) 120 MG 12 hr capsule Take 120 mg by mouth 2 (two) times daily.     gabapentin (NEURONTIN) 300 MG capsule Take 300 mg by mouth 2 (two) times daily.     lactulose (CHRONULAC) 10 GM/15ML solution Take 20 g by mouth 2 (two) times daily as needed for mild constipation.     metoprolol succinate (TOPROL-XL) 25 MG 24 hr tablet TAKE 1 TABLET (25 MG TOTAL) BY MOUTH DAILY. (Patient not taking: Reported on 01/29/2021) 90 tablet 1   metoprolol succinate (TOPROL-XL) 25 MG 24 hr tablet Take 12.5 mg by mouth daily. (Patient not taking: Reported  on 01/29/2021)     pantoprazole (PROTONIX) 40 MG tablet Take 1 tablet (40 mg total) by mouth 2 (two) times daily. 60 tablet 0   sevelamer carbonate (RENVELA) 800 MG tablet Take 2,400 mg by mouth See admin instructions. Take 3 tablets (2400 mg) by mouth with each meals and with each snack     sulfamethoxazole-trimethoprim (BACTRIM DS) 800-160 MG tablet Take 1 tablet by mouth 2 (two) times daily.     No facility-administered medications prior to visit.     Final Medications at End of Visit    Current Meds  Medication Sig   acetaminophen (TYLENOL) 500 MG tablet Take 500-1,000 mg by mouth every 6 (six) hours as needed (pain.).   albuterol (VENTOLIN HFA) 108 (90 Base) MCG/ACT inhaler INHALE 2 PUFFS BY MOUTH EVERY 4 HOURS AS NEEDED FOR WHEEZE OR FOR SHORTNESS OF BREATH   amiodarone (PACERONE) 200 MG tablet TAKE 1 TABLET BY MOUTH EVERY DAY (Patient taking differently: Take 200 mg by mouth in the morning.)   amiodarone (PACERONE) 200 MG tablet Take 200 mg by mouth daily.   cetirizine (ZYRTEC) 10 MG tablet Take 10 mg by mouth daily as needed for allergies.   cinacalcet (SENSIPAR) 30 MG tablet TAKE 2 TABLETS (60 MG TOTAL) BY MOUTH EVERY MONDAY, WEDNESDAY, AND FRIDAY WITH HEMODIALYSIS.   diltiazem (CARDIZEM CD) 120 MG 24 hr capsule TAKE 1 CAPSULE BY MOUTH EVERY DAY   doxycycline (VIBRAMYCIN) 100 MG  capsule Take 100 mg by mouth 2 (two) times daily.   gabapentin (NEURONTIN) 300 MG capsule TAKE 1 CAPSULE BY MOUTH IN THE MORNING AND AT BEDTIME   lactulose (CHRONULAC) 10 GM/15ML solution Take 30 mLs (20 g total) by mouth daily. (Patient taking differently: Take 20 g by mouth daily as needed (constipation).)   lidocaine-prilocaine (EMLA) cream Apply 1 application topically every Monday, Wednesday, and Friday with hemodialysis. Applied sparingly for port access   oxyCODONE (ROXICODONE) 5 MG immediate release tablet 1 tab PO q6 hours prn pain   Oxycodone HCl 10 MG TABS TAKE 1 TABLET (10 MG TOTAL) BY MOUTH THREE  TIMES DAILY AS NEEDED PAIN. (Patient taking differently: Take 10 mg by mouth 3 (three) times daily as needed (pain).)   Oxycodone HCl 10 MG TABS Take 10 mg by mouth 3 (three) times daily as needed. pain   pantoprazole (PROTONIX) 40 MG tablet Take 40 mg by mouth daily.   polyethylene glycol (MIRALAX / GLYCOLAX) 17 g packet Take 17 g by mouth daily as needed (constipation).   sevelamer carbonate (RENVELA) 800 MG tablet Take 800 mg by mouth 3 (three) times daily with meals.   Radiology:   No results found.  Cardiac Studies:   Coronary Angiogram 02/22/2017: No significant disease on left, mid RCA high-grade stenosis status post 3.5 x 23 mm Xience [place] DES.  Carotid Doppler 10/28/2016: Stenosis in the right common carotid artery (<50%) with severe heteregenous plaque. Stenosis in the left internal carotid artery (16-49%). Severe heteregenous plaque in the left common carotid artery with < 50% stenosis. Mild stenosis in the left external carotid artery (<50%). Antegrade vertebral artery flow. Follow up in one year is appropriate if clinically indicated.  Lower Venous DVT Study 10/05/2019: BILATERAL: - No evidence of deep vein thrombosis seen in the lower extremities, bilaterally.  RIGHT & LEFT: - No cystic structure found in the popliteal fossa.   Echocardiogram 02/05/2020: 1. Left ventricular ejection fraction, by estimation, is 50 to 55%. The left ventricle has normal function. There is severe left ventricular hypertrophy. Left ventricular diastolic function could not be evaluated. I cannot exclude septal hypokinesis. Endocardium not well visualized. septal motion also is consistent with LBBB.   2. Right ventricular systolic function is mildly reduced. The right ventricular size is moderately enlarged. Severely increased right ventricular wall thickness. There is moderately elevated pulmonary artery systolic pressure. The estimated right ventricular systolic pressure is 56.2 mmHg.   3. Left  atrial size was severely dilated.   4. Right atrial size was severely dilated.   5. The mitral valve is normal in structure. Mild mitral valve regurgitation.   6. Dilated TV annulus. Tricuspid valve regurgitation is moderate to severe.   7. AV sclerosis without stenosis. The aortic valve is tricuspid. Aortic valve regurgitation is trivial. Mild aortic valve sclerosis is present, with no evidence of aortic valve stenosis. Aortic valve mean gradient measures 8.3 mmHg.   8. The inferior vena cava is dilated in size with <50% respiratory variability, suggesting right atrial pressure of 15 mmHg.    EKG  EKG 01/29/2021: Atrial fibrillation with controlled ventricular response at a rate of 70 bpm.  Left bundle branch block, no further analysis.  Compared to EKG 10/28/2020, no significant change.  EKG 03/25/2020: Atrial fibrillation with controlled ventricular response at a rate of 81 bpm, complete left bundle branch block. No further analysis.  Compared to EKG 02/07/2020 ventricular rate controlled.   EKG 02/07/2020: Atrial fibrillation with rapid ventricular response, ventricular  rate 111 bpm, left bundle branch block.  No further analysis.  EKG 12/27/2019: Sinus rhythm with first-degree AV block at rate of 77 bpm, rare PACs, left bundle branch block.  No further analysis.  Assessment     ICD-10-CM   1. Paroxysmal atrial fibrillation (HCC)  I48.0 EKG 12-Lead    2. Cor pulmonale, chronic (HCC)  I27.81     3. Therapeutic drug monitoring  Z51.81     4. Left bundle branch block  I44.7      .   Meds ordered this encounter  Medications   metoprolol succinate (TOPROL-XL) 25 MG 24 hr tablet    Sig: Take 0.5 tablets (12.5 mg total) by mouth daily.    Dispense:  30 tablet    Refill:  3    Medications Discontinued During This Encounter  Medication Reason   cetirizine (ZYRTEC) 10 MG tablet Error   cinacalcet (SENSIPAR) 30 MG tablet Error   diltiazem (CARDIZEM SR) 120 MG 12 hr capsule Error    gabapentin (NEURONTIN) 300 MG capsule Error   lactulose (CHRONULAC) 10 GM/15ML solution Error   sulfamethoxazole-trimethoprim (BACTRIM DS) 800-160 MG tablet Error   sevelamer carbonate (RENVELA) 800 MG tablet Error   pantoprazole (PROTONIX) 40 MG tablet Error   metoprolol succinate (TOPROL-XL) 25 MG 24 hr tablet Dose change   metoprolol succinate (TOPROL-XL) 25 MG 24 hr tablet Reorder    Recommendations:   [name]  is a 60 y.o.  male  with very complex medical problems including severe COPD and chronic cor pulmonale with pulmonary hypertension, coronary artery disease s/p stenting to mid RCA in 2018, paroxysmal atrial fibrillation with RVR, hypertensive cardiomyopathy, dietary and medication noncompliance, end-stage renal disease on hemodialysis, chronic hepatitis B and C with cirrhosis of the liver and ascites,  tobacco use, smokes 1/2 ppd, h/o marijuana use and prior cocaine use quit in 2017. Not on anticoagulation due to cirrhosis of liver and compliance with Warfarin.  Presents for follow-up.  He is in atrial fibrillation which is well rate controlled.  However patient's blood pressure is soft in the office today.  Advised patient to reduce metoprolol to 12.5 mg once daily.  Patient has not yet obtained previously ordered TSH, he agrees to do so in the next 1 week.  There is no clinical signs of heart failure and patient is feeling relatively well from a cardiovascular standpoint.  Discussed the importance of medication compliance as well as bringing patient's medications with him to each visit in order to facilitate better medication reconciliation. Discussed with patient that as he is on amiodarone recommend he undergo annual eye exam and PFTs.  Will obtain TSH this time. Patient verbalized understanding of importance of eye exam and PFTs, he will discuss this with PCP.  Encourage patient to continue to take medications as directed and to abide by his dialysis schedule.  Advised  him regarding the importance of continued compliance with both dialysis and medications.  Follow-up in 3 months, sooner if needed.    Alethia Berthold, PA-C 01/29/2021, 5:49 PM Office: [phone]

## 2021-01-30 IMAGING — DX DG CHEST 1V PORT
1 series · 1 of 1 positions shown · non-contrast
Comparison: Radiograph 07/29/2019

CLINICAL DATA: Shortness of breath.

EXAM:
PORTABLE CHEST 1 VIEW

[chest]
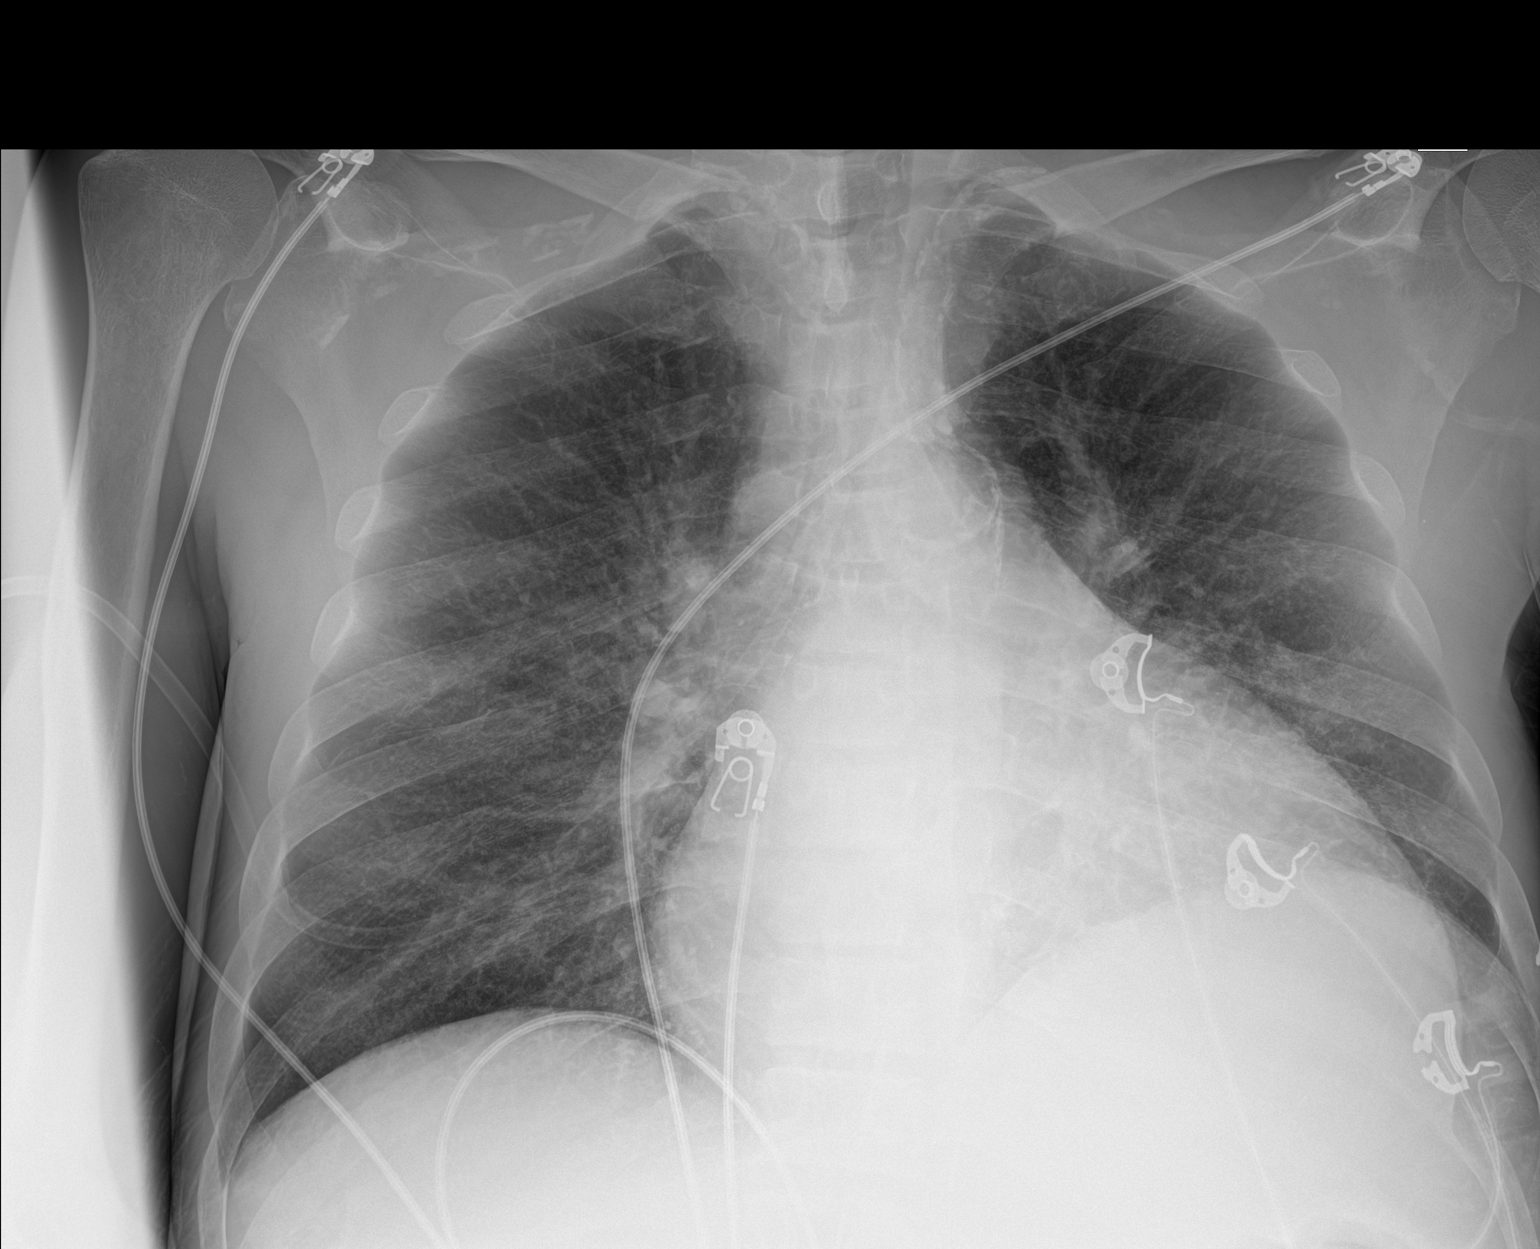

[1 of 1 positions shown; findings below may reference images not displayed]

FINDINGS: Stable cardiomegaly. Unchanged mediastinal contours with aortic
atherosclerosis. Similar vascular congestion. No large pleural
effusion or focal airspace disease. No pneumothorax. No acute
osseous abnormalities.
IMPRESSION: Stable cardiomegaly and vascular congestion.  No new abnormality.

Aortic Atherosclerosis ([id number]).

## 2021-02-03 ENCOUNTER — Encounter (HOSPITAL_COMMUNITY): Payer: Self-pay

## 2021-02-03 ENCOUNTER — Ambulatory Visit (HOSPITAL_COMMUNITY)
Admission: RE | Admit: 2021-02-03 | Discharge: 2021-02-03 | Disposition: A | Discharge status: Home / Self Care | Payer: Medicare Other | Source: Ambulatory Visit | Attending: Nephrology | Admitting: Nephrology

## 2021-02-03 ENCOUNTER — Other Ambulatory Visit: Payer: Self-pay

## 2021-02-03 DIAGNOSIS — K7469 Other cirrhosis of liver: Secondary | ICD-10-CM

## 2021-02-03 DIAGNOSIS — R188 Other ascites: Secondary | ICD-10-CM | POA: Diagnosis present

## 2021-02-03 HISTORY — PX: IR PARACENTESIS: IMG2679

## 2021-02-03 MED ORDER — LIDOCAINE HCL 1 % IJ SOLN
INTRAMUSCULAR | Status: AC
  Filled 2021-02-03: qty 20

## 2021-02-03 MED ORDER — LIDOCAINE HCL (PF) 1 % IJ SOLN
INTRAMUSCULAR | Status: DC | PRN
Start: 2021-02-03 — End: 2021-02-04
  Administered 2021-02-03: 10 mL

## 2021-02-03 NOTE — Procedures (Signed)
Ultrasound-guided therapeutic paracentesis performed yielding 4 liters of  blood-tinged fluid. No immediate complications.EBL none.

## 2021-02-05 ENCOUNTER — Ambulatory Visit: Payer: Medicare Other | Admitting: Physical Therapy

## 2021-02-06 ENCOUNTER — Other Ambulatory Visit: Payer: Self-pay | Admitting: Student

## 2021-02-06 DIAGNOSIS — I2781 Cor pulmonale (chronic): Secondary | ICD-10-CM

## 2021-02-06 IMAGING — DX DG CHEST 1V PORT
1 series · 1 of 1 positions shown · non-contrast
Comparison: 08/01/2019 and CT chest 05/07/2019.

CLINICAL DATA: Shortness of breath, abdominal pain, tachycardia.

EXAM:
PORTABLE CHEST 1 VIEW

[chest]
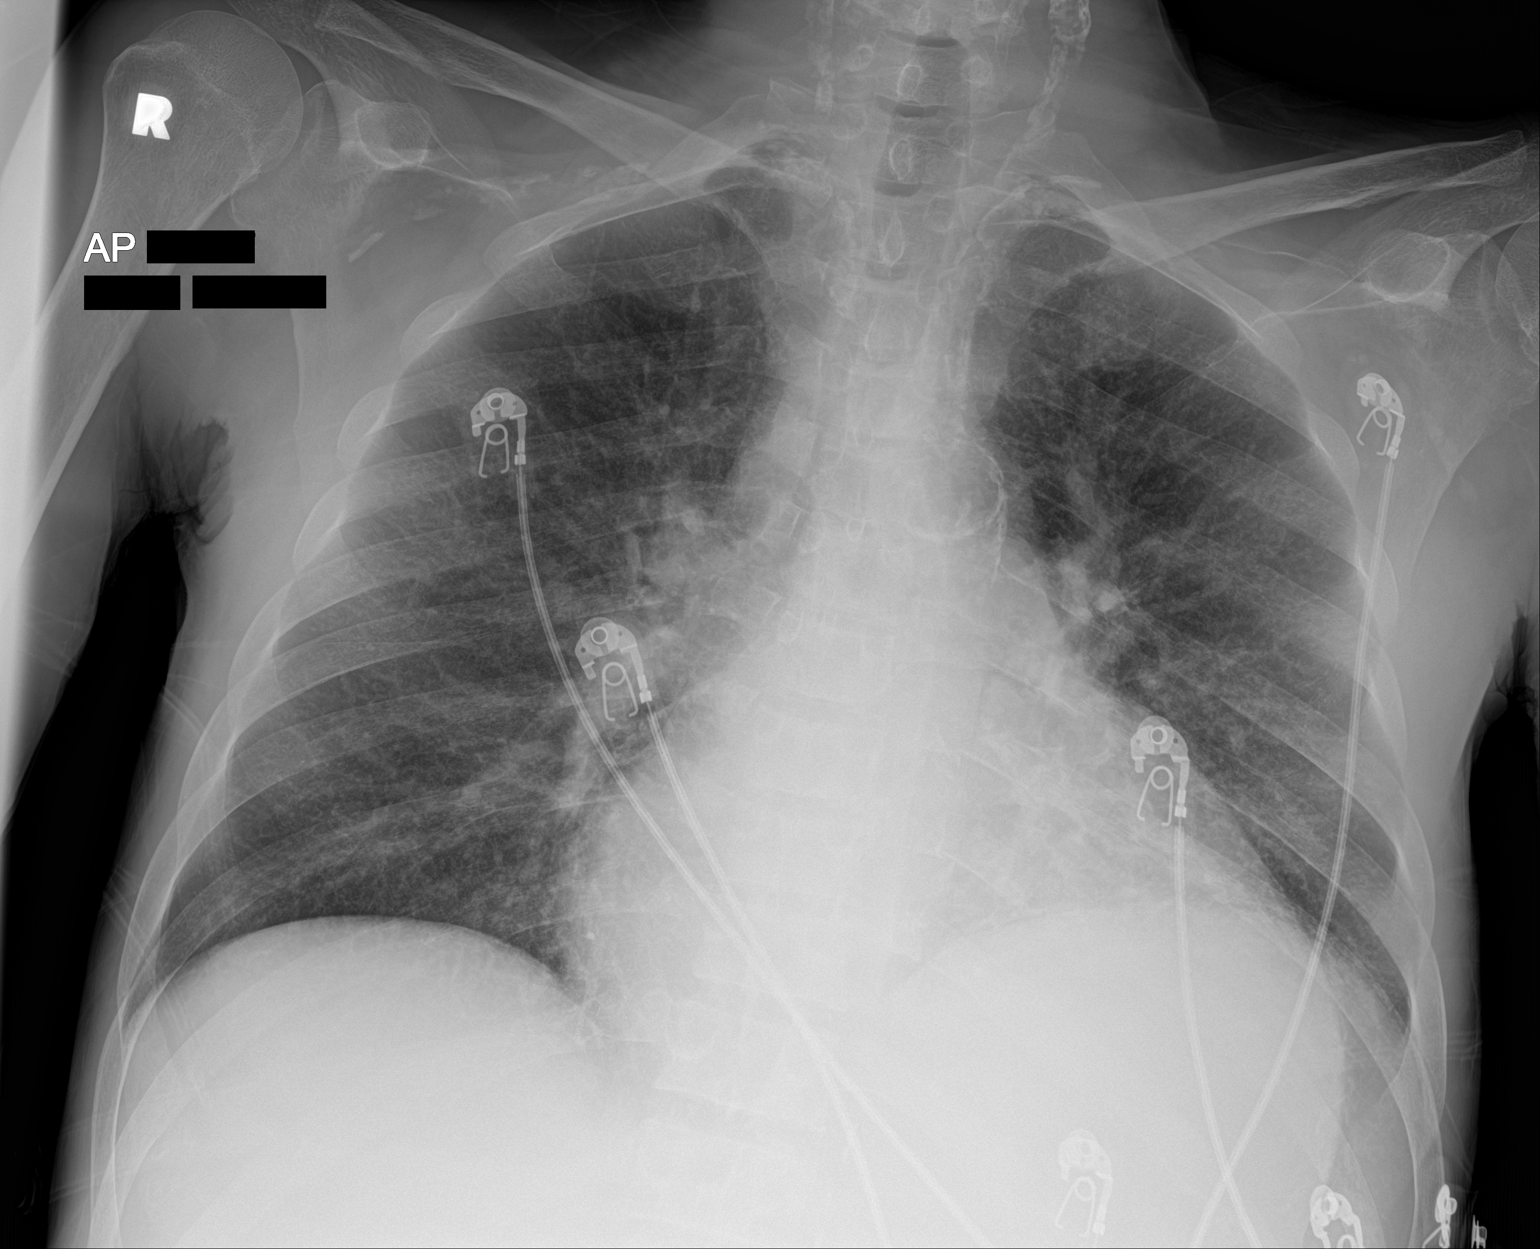

[1 of 1 positions shown; findings below may reference images not displayed]

FINDINGS: Trachea is midline. Heart is enlarged. Thoracic aorta is calcified.
There may be very mild interstitial prominence bilaterally. No
definite peripheral septal lines. No airspace consolidation. No
pleural fluid.
IMPRESSION: 1. Possible slight interstitial prominence. Difficult to exclude
pulmonary edema.
2.  Aortic atherosclerosis ([id number].S).

## 2021-02-07 IMAGING — US IR PARACENTESIS
1 series · 2 of 2 positions shown · non-contrast
Comparison: none

INDICATION: 58-year-old male with recurrent ascites

[Series 1: (id) · 2 of 2 slices shown]
[im 1/2]
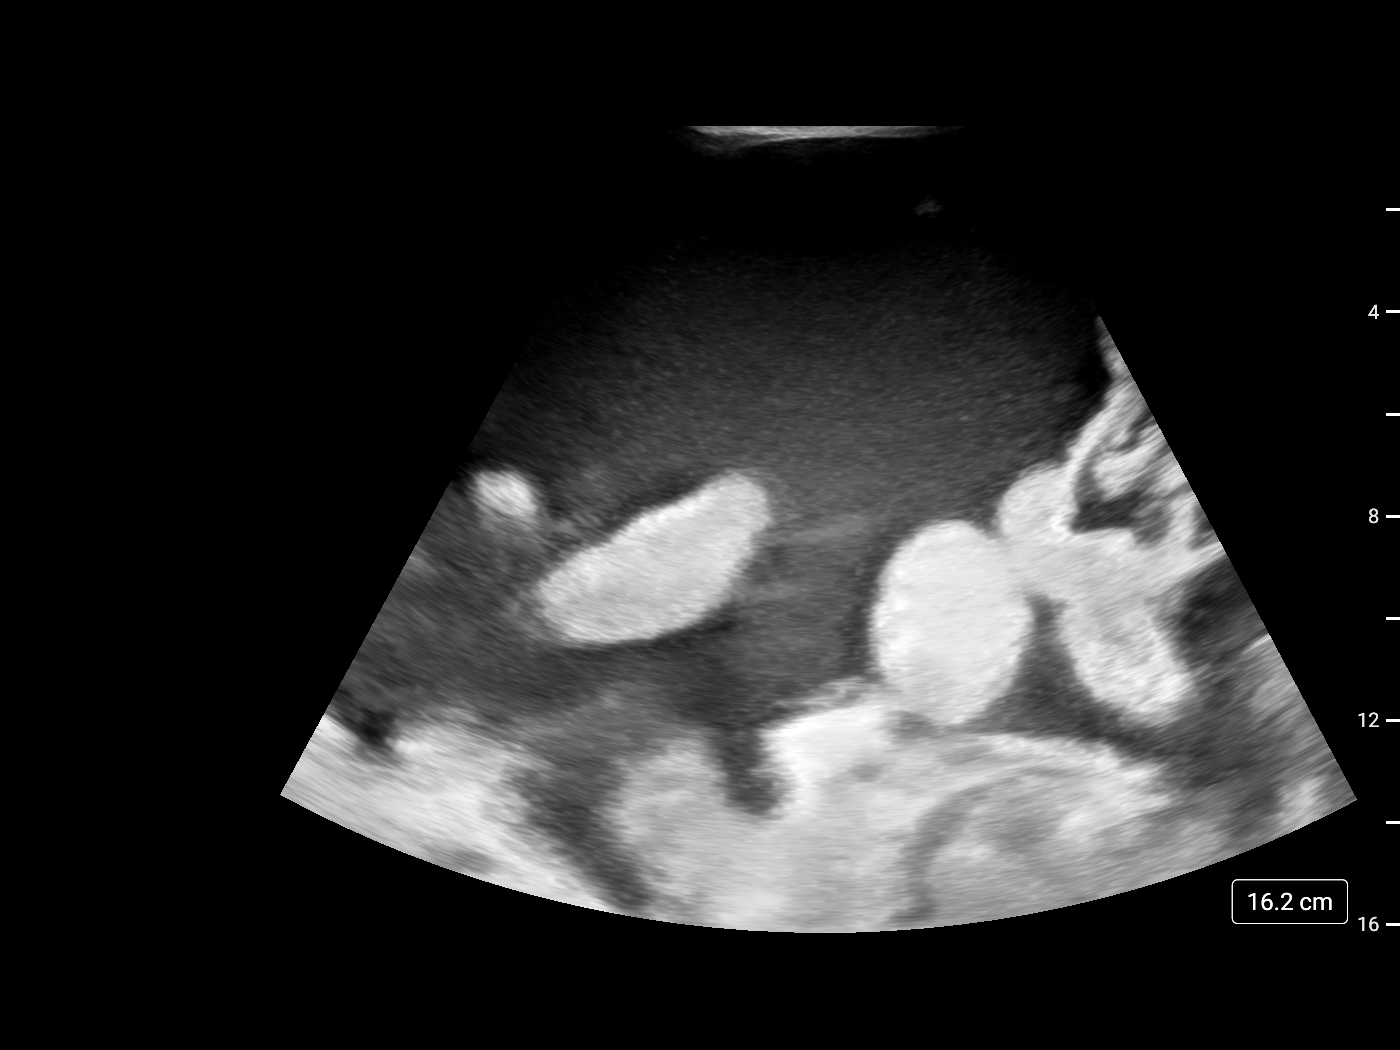
[im 2/2]
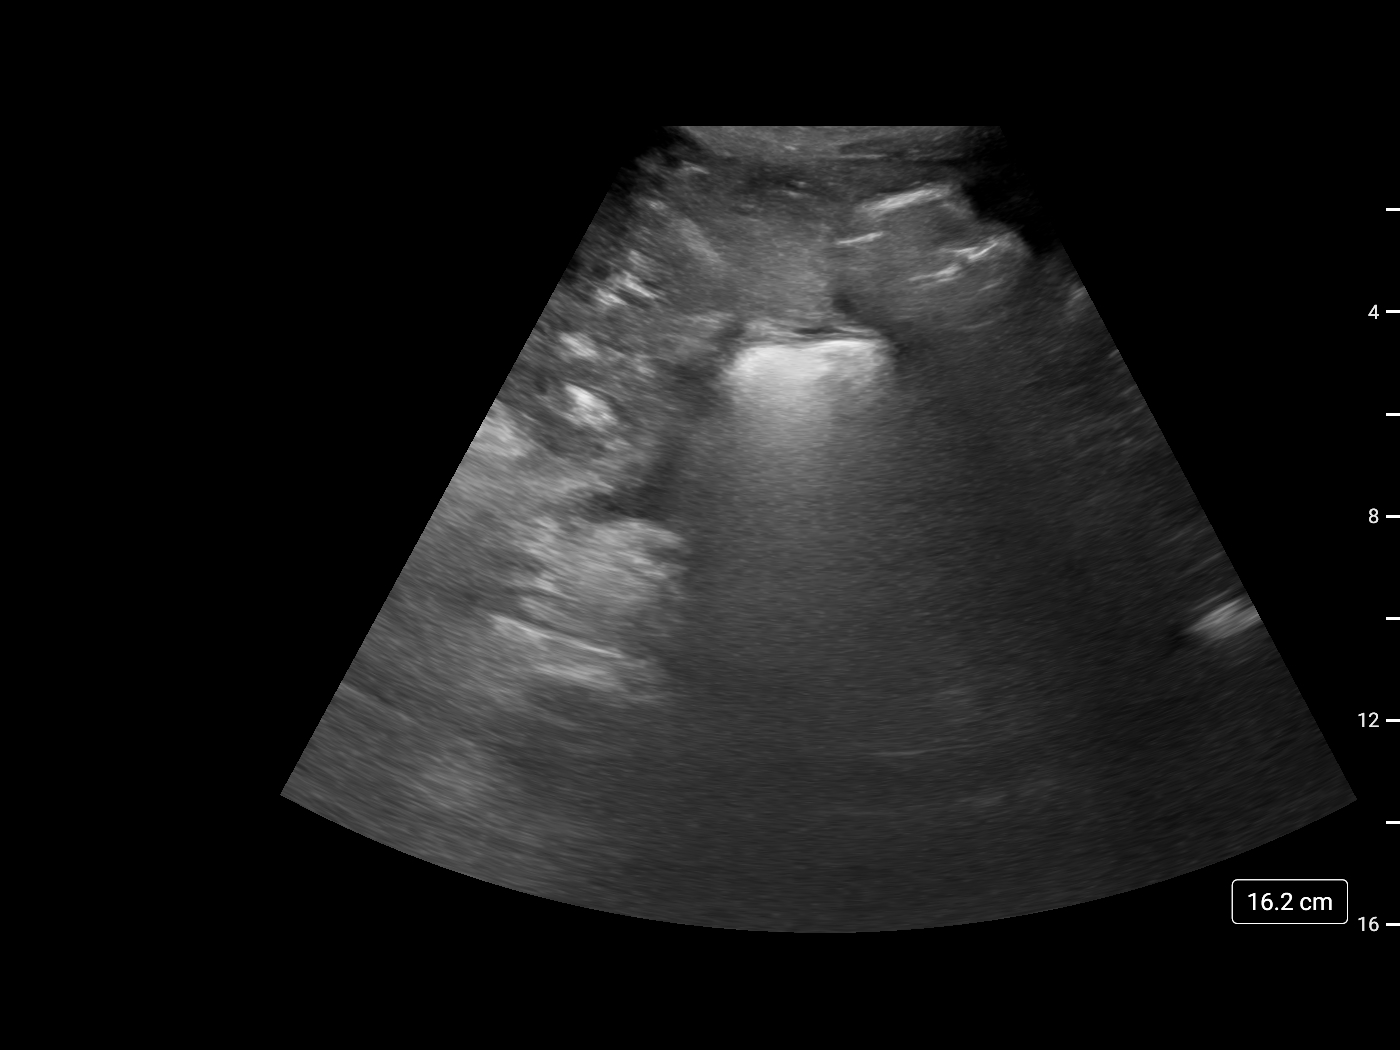

[2 of 2 positions shown; findings below may reference images not displayed]

EXAM:
ULTRASOUND GUIDED  PARACENTESIS

MEDICATIONS:
None.

COMPLICATIONS:
None

PROCEDURE:
Informed written consent was obtained from the patient after a
discussion of the risks, benefits and alternatives to treatment. A
timeout was performed prior to the initiation of the procedure.

Initial ultrasound scanning demonstrates a moderate amount of
ascites within the right lower abdominal quadrant. The right lower
abdomen was prepped and draped in the usual sterile fashion. 1%
lidocaine with epinephrine was used for local anesthesia.

Following this, a 8 Fr Safe-T-Centesis catheter was introduced. An
ultrasound image was saved for documentation purposes. The
paracentesis was performed. The catheter was removed and a dressing
was applied. The patient tolerated the procedure well without
immediate post procedural complication.
FINDINGS: A total of approximately 2.2 L of thin pinkish colored fluid was
removed. Samples were sent to the laboratory as requested by the
clinical team.
IMPRESSION: Status post ultrasound-guided paracentesis

## 2021-02-10 ENCOUNTER — Other Ambulatory Visit: Payer: Self-pay

## 2021-02-10 ENCOUNTER — Ambulatory Visit (HOSPITAL_COMMUNITY)
Admission: RE | Admit: 2021-02-10 | Discharge: 2021-02-10 | Disposition: A | Discharge status: Home / Self Care | Payer: Medicare Other | Source: Ambulatory Visit | Attending: Nephrology | Admitting: Nephrology

## 2021-02-10 DIAGNOSIS — R188 Other ascites: Secondary | ICD-10-CM | POA: Insufficient documentation

## 2021-02-10 DIAGNOSIS — K7469 Other cirrhosis of liver: Secondary | ICD-10-CM | POA: Diagnosis present

## 2021-02-10 HISTORY — PX: IR PARACENTESIS: IMG2679

## 2021-02-10 MED ORDER — LIDOCAINE HCL 1 % IJ SOLN
INTRAMUSCULAR | Status: AC
  Filled 2021-02-10: qty 20

## 2021-02-10 MED ORDER — LIDOCAINE HCL 1 % IJ SOLN
INTRAMUSCULAR | Status: AC | PRN
Start: 2021-02-10 — End: 2021-02-10
  Administered 2021-02-10: 10 mL

## 2021-02-11 ENCOUNTER — Telehealth: Payer: Self-pay | Admitting: Physical Therapy

## 2021-02-11 NOTE — Telephone Encounter (Signed)
Attempted to contact patient due to missed appointment on 02/05/21 and whether he would attend next scheduled appointment on 02/12/21. No answer and unable to leave VM due to mailbox being full.  Hilda Blades, PT, DPT, LAT, ATC 02/11/21  2:36 PM Phone: [phone] Fax: [phone]

## 2021-02-12 ENCOUNTER — Ambulatory Visit: Payer: Medicare Other | Attending: Family Medicine | Admitting: Physical Therapy

## 2021-02-12 ENCOUNTER — Other Ambulatory Visit: Payer: Self-pay

## 2021-02-12 ENCOUNTER — Encounter: Payer: Self-pay | Admitting: Physical Therapy

## 2021-02-12 DIAGNOSIS — R2689 Other abnormalities of gait and mobility: Secondary | ICD-10-CM | POA: Diagnosis present

## 2021-02-12 DIAGNOSIS — M6281 Muscle weakness (generalized): Secondary | ICD-10-CM | POA: Insufficient documentation

## 2021-02-12 DIAGNOSIS — R296 Repeated falls: Secondary | ICD-10-CM | POA: Diagnosis present

## 2021-02-12 NOTE — Patient Instructions (Signed)
Access Code: [facility] URL: [email or web address] Date: 02/12/2021 Prepared by: Hilda Blades  Exercises Active Straight Leg Raise with Quad Set - 1-2 x daily - 7 x weekly - 3 sets - 10 reps Supine Bridge - 1 x daily - 7 x weekly - 3 sets - 10 reps Clamshell - 1 x daily - 7 x weekly - 3 sets - 10 reps Seated Long Arc Quad - 1 x daily - 7 x weekly - 3 sets - 10 reps Sit to Stand with Arms Crossed - 1 x daily - 7 x weekly - 3 sets - 10 reps Seated Hamstring Stretch - 1-2 x daily - 7 x weekly - 3 reps - 30 seconds hold Supine Calf Stretch with Strap - 1-2 x daily - 7 x weekly - 3 reps - 30 seconds hold

## 2021-02-12 NOTE — Therapy (Signed)
[place], [place], [address] Phone: [phone]   Fax:  [phone]  Physical Therapy Treatment  Patient Details  Name: [name] MRN: [id number] Date of Birth: 08-30-1960 Referring Provider (PT): Charlott Rakes, MD   Encounter Date: 02/12/2021   PT End of Session - 02/12/21 1606     Visit Number 2    Number of Visits 6    Date for PT Re-Evaluation 03/05/21    Authorization Type UHC MCR    Progress Note Due on Visit 10    PT Start Time 3335    PT Stop Time 1443    PT Time Calculation (min) 45 min    Activity Tolerance Patient tolerated treatment well    Behavior During Therapy [facility] for tasks assessed/performed             Past Medical History:  Diagnosis Date   Anemia    Anxiety    Arthritis    left shoulder   Arthritis    Atherosclerosis of aorta (HCC)    Cardiomegaly    Chest pain    DATE UNKNOWN, C/O PERIODICALLY   Chest pain    Cocaine abuse ([place])    COPD (chronic obstructive pulmonary disease) ([place])    COPD exacerbation ([place]) 08/17/2016   Coronary artery disease    stent 02/22/17   Coronary artery disease    Dysrhythmia    ESRD (end stage renal disease) ([place])    ESRD (end stage renal disease) on dialysis ([place])    "[name]; M-W-F" (07/04/2017)   GERD (gastroesophageal reflux disease)    DATE UNKNOWN   GERD (gastroesophageal reflux disease)    Hemorrhoids    Hepatitis B    Hepatitis B, chronic (HCC)    Hepatitis C    History of kidney stones    Hyperkalemia    Hypertension    Kidney failure    Metabolic bone disease    Patient denies   Mitral stenosis    Myocardial infarction [facility])    Pneumonia    Pulmonary edema    Renal disorder    Solitary rectal ulcer syndrome 07/2017   at flex sig for rectal bleeding   Solitary rectal ulcer syndrome    Tubular adenoma of colon     Past Surgical History:  Procedure Laterality Date   A/V FISTULAGRAM Left  05/26/2017   Procedure: A/V FISTULAGRAM;  Surgeon: Conrad [place], MD;  Location: [place] [facility];  Service: Cardiovascular;  Laterality: Left;   A/V FISTULAGRAM Right 11/18/2017   Procedure: A/V FISTULAGRAM - Right Arm;  Surgeon: Elam Dutch, MD;  Location: [place] [facility];  Service: Cardiovascular;  Laterality: Right;   AMPUTATION Right 09/04/2020   Procedure: RIGHT INDEX AND RIGHT RING FINGER AMPUTATION DIGIT;  Surgeon: Leanora Cover, MD;  Location: [place];  Service: Orthopedics;  Laterality: Right;   AMPUTATION Right 11/13/2020   Procedure: RIGHT INDEX FINGER AMPUTATION AND RIGHT RING FINGER AMPUTATION;  Surgeon: Leanora Cover, MD;  Location: [place];  Service: Orthopedics;  Laterality: Right;   APPLICATION OF WOUND VAC Left 06/14/2017   Procedure: APPLICATION OF WOUND VAC;  Surgeon: Katha Cabal, MD;  Location: [facility];  Service: Vascular;  Laterality: Left;   AV FISTULA PLACEMENT  2012   BELIEVED WAS PLACED IN JUNE   AV FISTULA PLACEMENT Right 08/09/2017   Procedure: Creation Right arm ARTERIOVENOUS BRACHIOCEPOHALIC FISTULA;  Surgeon: Elam Dutch, MD;  Location: [place];  Service: Vascular;  Laterality: Right;   AV FISTULA PLACEMENT Right 11/22/2017   Procedure: INSERTION OF ARTERIOVENOUS (AV) GORE-TEX GRAFT RIGHT UPPER ARM;  Surgeon: Elam Dutch, MD;  Location: [place];  Service: Vascular;  Laterality: Right;   BIOPSY  01/25/2018   Procedure: BIOPSY;  Surgeon: Jerene Bears, MD;  Location: [place];  Service: Gastroenterology;;   BIOPSY  04/10/2019   Procedure: BIOPSY;  Surgeon: Jerene Bears, MD;  Location: [facility];  Service: Gastroenterology;;   COLONOSCOPY     COLONOSCOPY WITH PROPOFOL N/A 01/25/2018   Procedure: COLONOSCOPY WITH PROPOFOL;  Surgeon: Jerene Bears, MD;  Location: [place];  Service: Gastroenterology;  Laterality: N/A;   CORONARY STENT INTERVENTION N/A 02/22/2017   Procedure: CORONARY STENT INTERVENTION;  Surgeon: Nigel Mormon, MD;   Location: [place] [facility];  Service: Cardiovascular;  Laterality: N/A;   ESOPHAGOGASTRODUODENOSCOPY (EGD) WITH PROPOFOL N/A 01/25/2018   Procedure: ESOPHAGOGASTRODUODENOSCOPY (EGD) WITH PROPOFOL;  Surgeon: Jerene Bears, MD;  Location: [place];  Service: Gastroenterology;  Laterality: N/A;   ESOPHAGOGASTRODUODENOSCOPY (EGD) WITH PROPOFOL N/A 04/10/2019   Procedure: ESOPHAGOGASTRODUODENOSCOPY (EGD) WITH PROPOFOL;  Surgeon: Jerene Bears, MD;  [facility]: [facility];  Service: Gastroenterology;  Laterality: N/A;   FLEXIBLE SIGMOIDOSCOPY N/A 07/15/2017   Procedure: FLEXIBLE SIGMOIDOSCOPY;  Surgeon: Carol Ada, MD;  Location: [place];  Service: Endoscopy;  Laterality: N/A;   HEMORRHOID BANDING     I & D EXTREMITY Left 06/01/2017   Procedure: IRRIGATION AND DEBRIDEMENT LEFT ARM HEMATOMA WITH LIGATION OF LEFT ARM AV FISTULA;  Surgeon: Elam Dutch, MD;  Location: [place];  Service: Vascular;  Laterality: Left;   I & D EXTREMITY Left 06/14/2017   Procedure: IRRIGATION AND DEBRIDEMENT EXTREMITY;  Surgeon: Katha Cabal, MD;  Location: [facility];  Service: Vascular;  Laterality: Left;   INSERTION OF DIALYSIS CATHETER  05/30/2017   INSERTION OF DIALYSIS CATHETER N/A 05/30/2017   Procedure: INSERTION OF DIALYSIS CATHETER;  Surgeon: Elam Dutch, MD;  Location: [place];  Service: Vascular;  Laterality: N/A;   IR PARACENTESIS  08/30/2017   IR PARACENTESIS  09/29/2017   IR PARACENTESIS  10/28/2017   IR PARACENTESIS  11/09/2017   IR PARACENTESIS  11/16/2017   IR PARACENTESIS  11/28/2017   IR PARACENTESIS  12/01/2017   IR PARACENTESIS  12/06/2017   IR PARACENTESIS  01/03/2018   IR PARACENTESIS  01/23/2018   IR PARACENTESIS  02/07/2018   IR PARACENTESIS  02/21/2018   IR PARACENTESIS  03/06/2018   IR PARACENTESIS  03/17/2018   IR PARACENTESIS  04/04/2018   IR PARACENTESIS  12/28/2018   IR PARACENTESIS  01/08/2019   IR PARACENTESIS  01/23/2019   IR PARACENTESIS  02/01/2019   IR PARACENTESIS  02/19/2019    IR PARACENTESIS  03/01/2019   IR PARACENTESIS  03/15/2019   IR PARACENTESIS  04/03/2019   IR PARACENTESIS  04/12/2019   IR PARACENTESIS  05/01/2019   IR PARACENTESIS  05/08/2019   IR PARACENTESIS  05/24/2019   IR PARACENTESIS  06/12/2019   IR PARACENTESIS  07/09/2019   IR PARACENTESIS  07/27/2019   IR PARACENTESIS  08/09/2019   IR PARACENTESIS  08/21/2019   IR PARACENTESIS  09/17/2019   IR PARACENTESIS  10/05/2019   IR PARACENTESIS  10/29/2019   IR PARACENTESIS  11/08/2019   IR PARACENTESIS  12/12/2019   IR PARACENTESIS  01/03/2020   IR PARACENTESIS  01/10/2020   IR PARACENTESIS  01/17/2020   IR PARACENTESIS  01/24/2020   IR PARACENTESIS  01/31/2020   IR PARACENTESIS  02/07/2020   IR PARACENTESIS  02/21/2020   IR PARACENTESIS  05/21/2020   IR PARACENTESIS  10/10/2020   IR PARACENTESIS  10/28/2020   IR PARACENTESIS  11/18/2020   IR PARACENTESIS  12/02/2020   IR PARACENTESIS  12/11/2020   IR PARACENTESIS  12/18/2020   IR PARACENTESIS  12/30/2020   IR PARACENTESIS  01/06/2021   IR PARACENTESIS  01/13/2021   IR PARACENTESIS  01/20/2021   IR PARACENTESIS  01/27/2021   IR PARACENTESIS  02/03/2021   IR PARACENTESIS  02/10/2021   IR RADIOLOGIST EVAL & MGMT  02/14/2018   IR RADIOLOGIST EVAL & MGMT  02/22/2019   LEFT HEART CATH AND CORONARY ANGIOGRAPHY N/A 02/22/2017   Procedure: LEFT HEART CATH AND CORONARY ANGIOGRAPHY;  Surgeon: Nigel Mormon, MD;  Location: [place] [facility];  Service: Cardiovascular;  Laterality: N/A;   LIGATION OF ARTERIOVENOUS  FISTULA Left 08/16/971   Procedure: Plication of Left Arm Arteriovenous Fistula;  Surgeon: Elam Dutch, MD;  Location: [facility];  Service: Vascular;  Laterality: Left;   POLYPECTOMY     POLYPECTOMY  01/25/2018   Procedure: POLYPECTOMY;  Surgeon: Jerene Bears, MD;  Location: [place];  Service: Gastroenterology;;   REVISON OF ARTERIOVENOUS FISTULA Left 5/32/9924   Procedure: PLICATION OF DISTAL ANEURYSMAL SEGEMENT OF LEFT UPPER ARM ARTERIOVENOUS FISTULA;  Surgeon:  Elam Dutch, MD;  Location: [place];  Service: Vascular;  Laterality: Left;   REVISON OF ARTERIOVENOUS FISTULA Left 2/68/3419   Procedure: Plication of Left Upper Arm Fistula ;  Surgeon: Waynetta Sandy, MD;  Location: [place];  Service: Vascular;  Laterality: Left;   SKIN GRAFT SPLIT THICKNESS LEG / FOOT Left    SKIN GRAFT SPLIT THICKNESS LEFT ARM DONOR SITE: LEFT ANTERIOR THIGH   SKIN SPLIT GRAFT Left 07/04/2017   Procedure: SKIN GRAFT SPLIT THICKNESS LEFT ARM DONOR SITE: LEFT ANTERIOR THIGH;  Surgeon: Elam Dutch, MD;  Location: [place];  Service: Vascular;  Laterality: Left;   THROMBECTOMY W/ EMBOLECTOMY Left 06/05/2017   Procedure: EXPLORATION OF LEFT ARM FOR BLEEDING; OVERSEWED PROXIMAL FISTULA;  Surgeon: Angelia Mould, MD;  Location: [place];  Service: Vascular;  Laterality: Left;   WOUND EXPLORATION Left 06/03/2017   Procedure: WOUND EXPLORATION WITH WOUND VAC APPLICATION TO LEFT ARM;  Surgeon: Angelia Mould, MD;  Location: [place];  Service: Vascular;  Laterality: Left;    There were no vitals filed for this visit.   Subjective Assessment - 02/12/21 1604     Subjective Patient reports he has missed past 2 visits due to having stomach pain, they have been taking a gallon of fluid off his stomach. He denies any falls since last visit. He has not done any of his exercises since his last visit.    Patient Stated Goals Get legs stronger to stop from falling    Currently in Pain? No/denies                [facility] PT Assessment - 02/12/21 0001       Observation/Other Assessments   Focus on Therapeutic Outcomes (FOTO)  45% functional status                           [facility] Adult PT Treatment/Exercise - 02/12/21 0001       Self-Care   Self-Care Other Self-Care Comments    Other Self-Care Comments  FOTO      Exercises   Exercises Knee/Hip      Knee/Hip Exercises: Stretches   Passive Hamstring Stretch 2 reps;30 seconds    Passive  Hamstring Stretch Limitations seated edge of mat    Gastroc Stretch 2 reps;30 seconds    Gastroc Stretch Limitations slant board      Knee/Hip Exercises: Aerobic   Nustep L4 x 5 min with UE/LE while taking subjective      Knee/Hip Exercises: Seated   Long Arc Quad 2 sets;10 reps    Sit to [employer] 2 sets;10 reps;without UE support      Knee/Hip Exercises: Supine   Bridges 2 sets;10 reps    Straight Leg Raises 2 sets;10 reps      Knee/Hip Exercises: Sidelying   Clams 2 x 10                    PT Education - 02/12/21 1605     Education Details HEP, FOTO, attendance policy    Person(s) Educated Patient    Methods Explanation;Demonstration;Verbal cues;Handout    Comprehension Verbalized understanding;Returned demonstration;Verbal cues required;Need further instruction              PT Short Term Goals - 01/22/21 1702       PT SHORT TERM GOAL #1   Title STG = LTG               PT Long Term Goals - 02/12/21 1608       PT LONG TERM GOAL #1   Title Patient will be I with final HEP to maintain progress from PT    Baseline patient reports he has not been doing HEP    Time 6    Period Weeks    Status On-going    Target Date 03/05/21      PT LONG TERM GOAL #2   Title Patient will exhibit hip strength >/= 4/5 MMT and knee strength = 5/5 MMT to improve walking and stair negotiation    Time 6    Period Weeks    Status On-going    Target Date 03/05/21      PT LONG TERM GOAL #3   Title Patient will be able to perform 5xSTS in </= 15 seconds to indicate improved strength and reduced fall risk    Time 6    Period Weeks    Status On-going    Target Date 03/05/21      PT LONG TERM GOAL #4   Title Patient will report >/= 55% functional status on FOTO    Baseline 45%    Time 6    Period Weeks    Status New    Target Date 03/05/21                   Plan - 02/12/21 1606     Clinical Impression Statement Patient tolerated therapy well with no  adverse effects. Therapy focused on reviewing HEP and increasing reps to continue progressing strength and endurance. Patient did require cueing for exercise technique and pacing to perform in a controlled manner. Updated HEP to increase the repetitions of exercises and patient encouraged to work on consistency of his exercises at home to improve his functional ability. He did report fatigue post therapy and required extended rest breaks to catch his breath between exercises. Patient would benefit from continued skilled PT to progress his strength and mobility in order to improve walking and reduce fall risk to  maximize functional ability.    PT Treatment/Interventions ADLs/Self Care Home Management;Aquatic Therapy;Cryotherapy;Electrical Stimulation;Iontophoresis 4mg /ml Dexamethasone;Moist Heat;Traction;Ultrasound;Neuromuscular re-education;Balance training;Therapeutic exercise;Therapeutic activities;Functional mobility training;Stair training;Gait training;Patient/family education;Manual techniques;Dry needling;Passive range of motion;Taping;Vasopneumatic Device;Spinal Manipulations;Joint Manipulations    PT Next Visit Plan Review HEP and progress PRN, progress general LE strengthening progressing to standing exercises, initiate balance training as able    PT Home Exercise Plan [facility]    Consulted and Agree with Plan of Care Patient             Patient will benefit from skilled therapeutic intervention in order to improve the following deficits and impairments:  Abnormal gait, Decreased range of motion, Difficulty walking, Decreased strength, Decreased balance, Impaired flexibility, Decreased activity tolerance  Visit Diagnosis: Muscle weakness (generalized)  Other abnormalities of gait and mobility  Repeated falls     Problem List Patient Active Problem List   Diagnosis Date Noted   Tylenol overdose 09/07/2020   DNR (do not resuscitate)    CAP (community acquired pneumonia)  02/09/2020   Leukocytosis 01/19/2020   Tobacco use 01/19/2020   Acute pulmonary edema ([place]) 12/21/2019   Acetaminophen overdose 10/27/2019   ESRD (end stage renal disease) ([place])    Goals of care, counseling/discussion    Hypoxia 10/04/2019   Advanced care planning/counseling discussion    Renal failure 09/26/2019   Dyspnea 06/09/2019   Acquired thrombophilia ([place]) 06/05/2019   A-fib ([place]) 05/30/2019   Atrial fibrillation with RVR ([place]) 05/29/2019   Melena    Pressure injury of skin 03/09/2019   Abdominal distention    Volume overload 12/28/2018   Sepsis ([place]) 09/12/2018   Coronary artery disease involving native coronary artery of native heart without angina pectoris 03/11/2018   Benign neoplasm of cecum    Benign neoplasm of ascending colon    Benign neoplasm of descending colon    Benign neoplasm of rectum    AF (paroxysmal atrial fibrillation) ([place]) 01/23/2018   Hx of colonic polyps 01/20/2018   End-stage renal disease on hemodialysis ([place]) 11/21/2017   GERD (gastroesophageal reflux disease) 11/16/2017   Decompensated hepatic cirrhosis ([place]) 11/15/2017   Palliative care by specialist    Hyponatremia 11/04/2017   SBP (spontaneous bacterial peritonitis) ([place]) 10/30/2017   Liver disease, chronic 10/30/2017   SOB (shortness of breath)    Abdominal pain 10/28/2017   Upper airway cough syndrome with flattening on f/v loop 10/13/17 c/w vcd 10/17/2017   Elevated diaphragm 10/13/2017   Ileus ([place]) 09/29/2017   QT prolongation 09/29/2017   Malnutrition of moderate degree 09/29/2017   Sinus congestion 09/03/2017   Symptomatic anemia 09/02/2017   Cirrhosis of liver with ascites ([place]) 09/02/2017   Left bundle branch block 09/02/2017   Mitral stenosis 09/02/2017   Hematochezia 07/15/2017   Wide-complex tachycardia ([place])    Endotracheally intubated    ESRD on dialysis ([place]) 07/04/2017   CKD (chronic kidney disease) stage V requiring chronic dialysis ([place]) 06/18/2017   History of  Cocaine abuse ([place]) 06/18/2017   Hypertension 06/18/2017   Infection of AV graft for dialysis ([place]) 06/18/2017   Anxiety 06/18/2017   Anemia due to chronic kidney disease 06/18/2017   Atypical atrial flutter ([place]) 06/18/2017   Personality disorder ([place]) 06/13/2017   Cellulitis 06/12/2017   Adjustment disorder with mixed anxiety and depressed mood 06/10/2017   Suicidal ideation 06/10/2017   Arm wound, left, sequela 06/10/2017   Dyspnea on exertion 05/29/2017   Tachycardia 05/29/2017   Hyperkalemia 26/33/3545   Acute metabolic encephalopathy    Anemia  04/23/2017   Ascites 04/23/2017   COPD (chronic obstructive pulmonary disease) ([place]) 04/23/2017   Acute on chronic respiratory failure with hypoxia ([place]) 03/25/2017   Arrhythmia 03/25/2017   COPD GOLD 0 with flattening on inps f/v  09/27/2016   Essential hypertension 09/27/2016   Fluid overload 08/30/2016   COPD exacerbation ([place]) 08/17/2016   Hypertensive urgency 08/17/2016   Problem with dialysis access ([place]) 07/23/2016   Chronic hepatitis B ([place]) 03/05/2014   Chronic hepatitis C without hepatic coma ([place]) 03/05/2014   Internal hemorrhoids with bleeding, swelling and itching 03/05/2014   Thrombocytopenia ([place]) 03/05/2014   Chest pain 02/27/2014   Alcohol abuse 04/14/2009   Nicotine dependence, cigarettes, uncomplicated 03/30/8021   GANGLION CYST 04/14/2009    Hilda Blades, PT, DPT, LAT, ATC 02/12/21  4:44 PM Phone: [phone] Fax: [facility], [place], [address] Phone: [phone]   Fax:  [phone]  Name: [name] MRN: [id number] Date of Birth: 14-Feb-1961

## 2021-02-17 ENCOUNTER — Ambulatory Visit (HOSPITAL_COMMUNITY)
Admission: RE | Admit: 2021-02-17 | Discharge: 2021-02-17 | Disposition: A | Discharge status: Home / Self Care | Payer: Medicare Other | Source: Ambulatory Visit | Attending: Nephrology | Admitting: Nephrology

## 2021-02-17 ENCOUNTER — Encounter: Payer: Self-pay | Admitting: Radiology

## 2021-02-17 ENCOUNTER — Other Ambulatory Visit: Payer: Self-pay

## 2021-02-17 DIAGNOSIS — R188 Other ascites: Secondary | ICD-10-CM | POA: Insufficient documentation

## 2021-02-17 DIAGNOSIS — K7469 Other cirrhosis of liver: Secondary | ICD-10-CM | POA: Diagnosis present

## 2021-02-17 HISTORY — PX: IR PARACENTESIS: IMG2679

## 2021-02-17 MED ORDER — LIDOCAINE HCL 1 % IJ SOLN
INTRAMUSCULAR | Status: DC | PRN
Start: 2021-02-17 — End: 2021-02-18
  Administered 2021-02-17: 10 mL

## 2021-02-17 MED ORDER — LIDOCAINE HCL 1 % IJ SOLN
INTRAMUSCULAR | Status: AC
  Filled 2021-02-17: qty 20

## 2021-02-17 NOTE — Procedures (Signed)
Ultrasound-guided therapeutic paracentesis performed yielding 4.2 liters of serosanguinous colored fluid.No immediate complications. EBL is non.

## 2021-02-19 ENCOUNTER — Ambulatory Visit: Payer: Medicare Other | Admitting: Physical Therapy

## 2021-02-19 ENCOUNTER — Encounter (HOSPITAL_COMMUNITY): Payer: Self-pay

## 2021-02-19 IMAGING — US IR PARACENTESIS
1 series · 2 of 2 positions shown · non-contrast
Comparison: none

INDICATION: Abdominal distention. End-stage renal disease. Recurrent ascites.
Request for diagnostic and therapeutic paracentesis.

[Series 1: (id) · 2 of 2 slices shown]
[im 1/2]
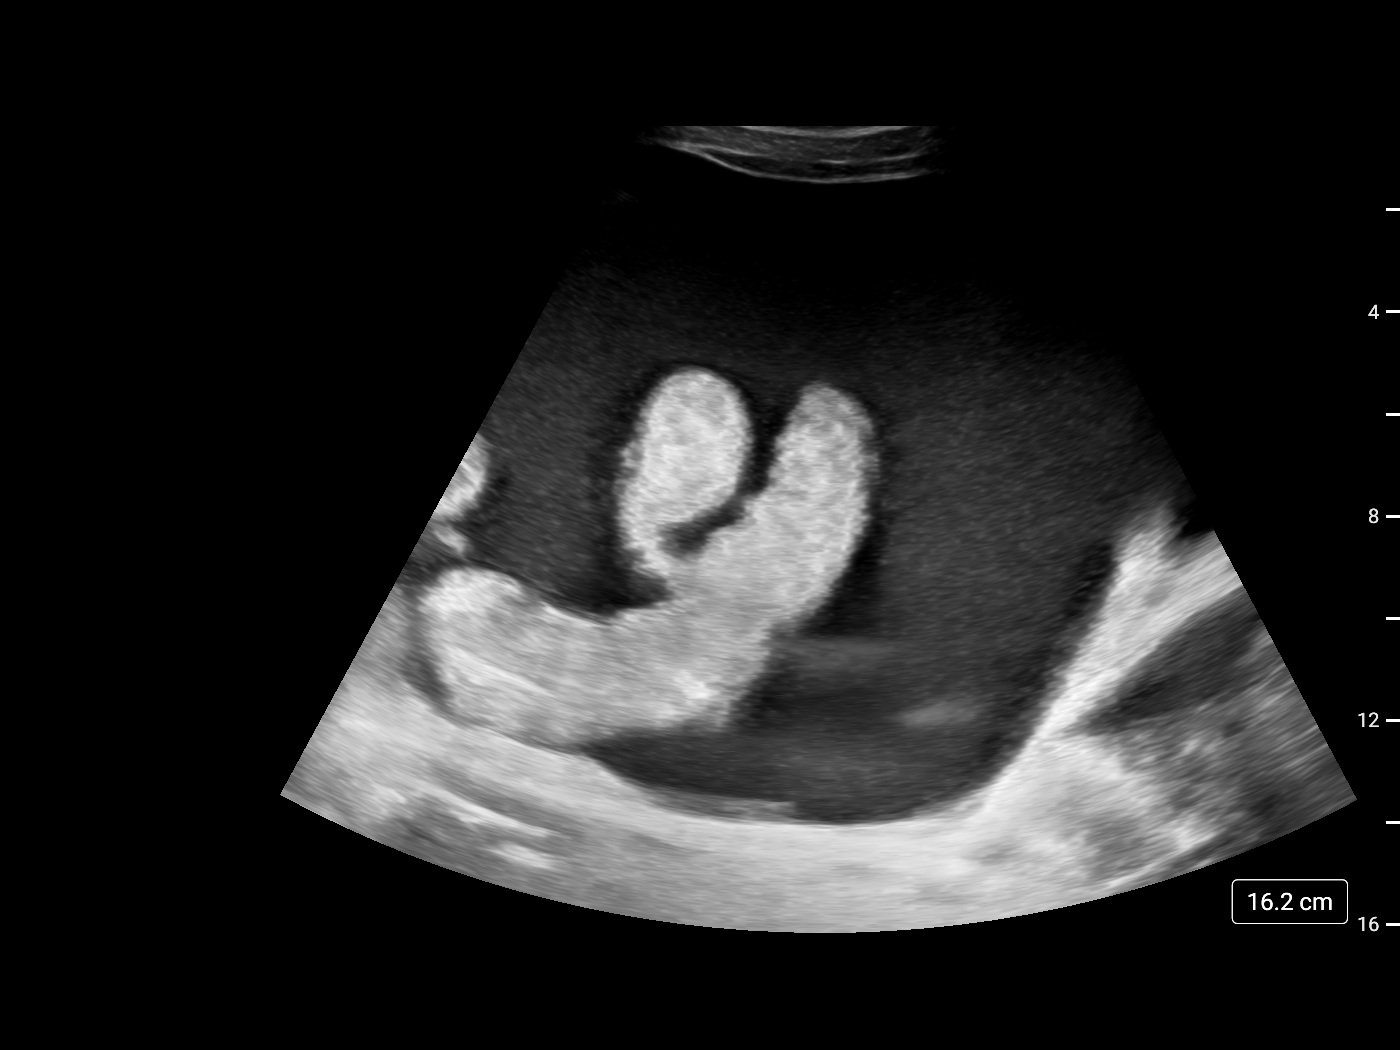
[im 2/2]
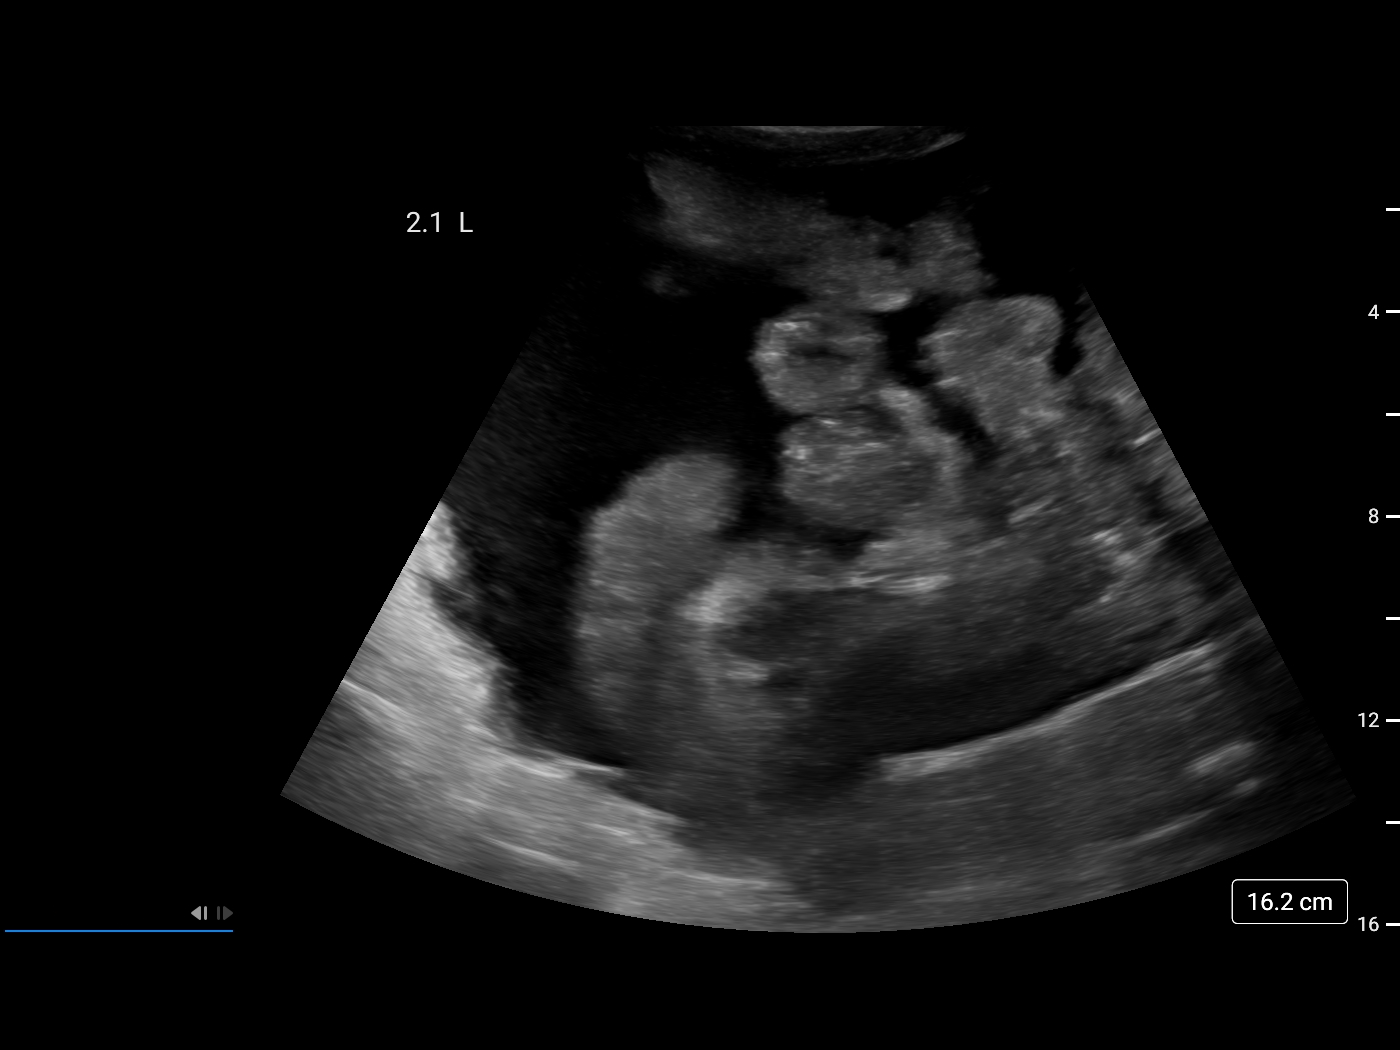

[2 of 2 positions shown; findings below may reference images not displayed]

EXAM:
ULTRASOUND GUIDED LEFT LOWER QUADRANT PARACENTESIS

MEDICATIONS:
None.

COMPLICATIONS:
None immediate.

PROCEDURE:
Informed written consent was obtained from the patient after a
discussion of the risks, benefits and alternatives to treatment. A
timeout was performed prior to the initiation of the procedure.

Initial ultrasound scanning demonstrates a large amount of ascites
within the right lower abdominal quadrant. The right lower abdomen
was prepped and draped in the usual sterile fashion. 1% lidocaine
was used for local anesthesia.

Following this, a 19 gauge, 7-cm, Yueh catheter was introduced. An
ultrasound image was saved for documentation purposes. The
paracentesis was performed. The catheter was removed and a dressing
was applied. The patient tolerated the procedure well without
immediate post procedural complication.
FINDINGS: A total of approximately 2.1 L of serosanguineous fluid was removed.
Samples were sent to the laboratory as requested by the clinical
team.
IMPRESSION: Successful ultrasound-guided paracentesis yielding 2.1 liters of
peritoneal fluid.

## 2021-02-24 ENCOUNTER — Encounter (HOSPITAL_COMMUNITY): Payer: Self-pay

## 2021-02-24 ENCOUNTER — Other Ambulatory Visit: Payer: Self-pay | Admitting: Nephrology

## 2021-02-24 ENCOUNTER — Other Ambulatory Visit (HOSPITAL_COMMUNITY): Payer: Self-pay | Admitting: Nephrology

## 2021-02-24 ENCOUNTER — Ambulatory Visit (HOSPITAL_COMMUNITY)
Admission: RE | Admit: 2021-02-24 | Discharge: 2021-02-24 | Disposition: A | Discharge status: Home / Self Care | Payer: Medicare Other | Source: Ambulatory Visit | Attending: Nephrology | Admitting: Nephrology

## 2021-02-24 ENCOUNTER — Inpatient Hospital Stay (HOSPITAL_COMMUNITY): Admission: RE | Admit: 2021-02-24 | Payer: Medicare Other | Source: Ambulatory Visit

## 2021-02-24 ENCOUNTER — Other Ambulatory Visit: Payer: Self-pay

## 2021-02-24 DIAGNOSIS — K7469 Other cirrhosis of liver: Secondary | ICD-10-CM | POA: Insufficient documentation

## 2021-02-24 DIAGNOSIS — R188 Other ascites: Secondary | ICD-10-CM | POA: Diagnosis not present

## 2021-02-24 HISTORY — PX: IR PARACENTESIS: IMG2679

## 2021-02-24 MED ORDER — LIDOCAINE HCL (PF) 1 % IJ SOLN
INTRAMUSCULAR | Status: AC | PRN
  Administered 2021-02-24: 10 mL

## 2021-02-24 MED ORDER — LIDOCAINE HCL 1 % IJ SOLN
INTRAMUSCULAR | Status: AC
  Filled 2021-02-24: qty 20

## 2021-02-24 NOTE — Procedures (Signed)
PROCEDURE SUMMARY:  Successful image-guided paracentesis from the left lower abdomen.  Yielded 3.7 liters of hazy amber fluid.  No immediate complications.  EBL = trace. Patient tolerated well.   Specimen was sent for labs.  Please see imaging section of Epic for full dictation.   Sreshta Cressler H Lorell Thibodaux PA-C 02/24/2021 1:24 PM

## 2021-02-26 ENCOUNTER — Ambulatory Visit: Payer: Medicare Other | Admitting: Physical Therapy

## 2021-03-02 ENCOUNTER — Encounter (HOSPITAL_COMMUNITY): Payer: Self-pay

## 2021-03-02 ENCOUNTER — Emergency Department (HOSPITAL_COMMUNITY): Payer: Medicare Other

## 2021-03-02 ENCOUNTER — Other Ambulatory Visit: Payer: Self-pay

## 2021-03-02 ENCOUNTER — Inpatient Hospital Stay (HOSPITAL_COMMUNITY)
Admission: EM | Admit: 2021-03-02 | Discharge: 2021-03-03 | DRG: 640 | Discharge status: Against Medical Advice | Payer: Medicare Other | Attending: Internal Medicine | Admitting: Internal Medicine

## 2021-03-02 DIAGNOSIS — B181 Chronic viral hepatitis B without delta-agent: Secondary | ICD-10-CM | POA: Diagnosis present

## 2021-03-02 DIAGNOSIS — E875 Hyperkalemia: Principal | ICD-10-CM | POA: Diagnosis present

## 2021-03-02 DIAGNOSIS — R4182 Altered mental status, unspecified: Secondary | ICD-10-CM | POA: Diagnosis not present

## 2021-03-02 DIAGNOSIS — Z9119 Patient's noncompliance with other medical treatment and regimen: Secondary | ICD-10-CM

## 2021-03-02 DIAGNOSIS — Z808 Family history of malignant neoplasm of other organs or systems: Secondary | ICD-10-CM

## 2021-03-02 DIAGNOSIS — Z800 Family history of malignant neoplasm of digestive organs: Secondary | ICD-10-CM

## 2021-03-02 DIAGNOSIS — I10 Essential (primary) hypertension: Secondary | ICD-10-CM | POA: Diagnosis present

## 2021-03-02 DIAGNOSIS — I4891 Unspecified atrial fibrillation: Secondary | ICD-10-CM | POA: Diagnosis present

## 2021-03-02 DIAGNOSIS — Z20822 Contact with and (suspected) exposure to covid-19: Secondary | ICD-10-CM | POA: Diagnosis present

## 2021-03-02 DIAGNOSIS — R188 Other ascites: Secondary | ICD-10-CM | POA: Diagnosis present

## 2021-03-02 DIAGNOSIS — J449 Chronic obstructive pulmonary disease, unspecified: Secondary | ICD-10-CM | POA: Diagnosis present

## 2021-03-02 DIAGNOSIS — E162 Hypoglycemia, unspecified: Secondary | ICD-10-CM | POA: Diagnosis present

## 2021-03-02 DIAGNOSIS — Z8249 Family history of ischemic heart disease and other diseases of the circulatory system: Secondary | ICD-10-CM

## 2021-03-02 DIAGNOSIS — I120 Hypertensive chronic kidney disease with stage 5 chronic kidney disease or end stage renal disease: Secondary | ICD-10-CM | POA: Diagnosis present

## 2021-03-02 DIAGNOSIS — Z825 Family history of asthma and other chronic lower respiratory diseases: Secondary | ICD-10-CM

## 2021-03-02 DIAGNOSIS — Z5329 Procedure and treatment not carried out because of patient's decision for other reasons: Secondary | ICD-10-CM | POA: Diagnosis present

## 2021-03-02 DIAGNOSIS — I2781 Cor pulmonale (chronic): Secondary | ICD-10-CM | POA: Diagnosis present

## 2021-03-02 DIAGNOSIS — Z885 Allergy status to narcotic agent status: Secondary | ICD-10-CM

## 2021-03-02 DIAGNOSIS — Z992 Dependence on renal dialysis: Secondary | ICD-10-CM

## 2021-03-02 DIAGNOSIS — N186 End stage renal disease: Secondary | ICD-10-CM | POA: Diagnosis present

## 2021-03-02 DIAGNOSIS — N2581 Secondary hyperparathyroidism of renal origin: Secondary | ICD-10-CM | POA: Diagnosis present

## 2021-03-02 DIAGNOSIS — Z79899 Other long term (current) drug therapy: Secondary | ICD-10-CM

## 2021-03-02 DIAGNOSIS — F17210 Nicotine dependence, cigarettes, uncomplicated: Secondary | ICD-10-CM | POA: Diagnosis present

## 2021-03-02 DIAGNOSIS — D631 Anemia in chronic kidney disease: Secondary | ICD-10-CM | POA: Diagnosis present

## 2021-03-02 DIAGNOSIS — E871 Hypo-osmolality and hyponatremia: Secondary | ICD-10-CM | POA: Diagnosis not present

## 2021-03-02 DIAGNOSIS — I2510 Atherosclerotic heart disease of native coronary artery without angina pectoris: Secondary | ICD-10-CM | POA: Diagnosis present

## 2021-03-02 DIAGNOSIS — I700 Atherosclerosis of aorta: Secondary | ICD-10-CM | POA: Diagnosis present

## 2021-03-02 DIAGNOSIS — K7460 Unspecified cirrhosis of liver: Secondary | ICD-10-CM | POA: Diagnosis present

## 2021-03-02 DIAGNOSIS — K219 Gastro-esophageal reflux disease without esophagitis: Secondary | ICD-10-CM | POA: Diagnosis present

## 2021-03-02 DIAGNOSIS — G9340 Encephalopathy, unspecified: Secondary | ICD-10-CM | POA: Diagnosis present

## 2021-03-02 DIAGNOSIS — Z888 Allergy status to other drugs, medicaments and biological substances status: Secondary | ICD-10-CM

## 2021-03-02 DIAGNOSIS — K7010 Alcoholic hepatitis without ascites: Secondary | ICD-10-CM | POA: Diagnosis present

## 2021-03-02 DIAGNOSIS — I252 Old myocardial infarction: Secondary | ICD-10-CM

## 2021-03-02 DIAGNOSIS — Z801 Family history of malignant neoplasm of trachea, bronchus and lung: Secondary | ICD-10-CM

## 2021-03-02 DIAGNOSIS — B1920 Unspecified viral hepatitis C without hepatic coma: Secondary | ICD-10-CM | POA: Diagnosis present

## 2021-03-02 DIAGNOSIS — Z886 Allergy status to analgesic agent status: Secondary | ICD-10-CM

## 2021-03-02 LAB — COMPREHENSIVE METABOLIC PANEL
ALT: 17 U/L (ref 0–44)
AST: 52 U/L — ABNORMAL HIGH (ref 15–41)
Albumin: 2.1 g/dL — ABNORMAL LOW (ref 3.5–5.0)
Alkaline Phosphatase: 183 U/L — ABNORMAL HIGH (ref 38–126)
Anion gap: 17 — ABNORMAL HIGH (ref 5–15)
BUN: 54 mg/dL — ABNORMAL HIGH (ref 6–20)
CO2: 23 mmol/L (ref 22–32)
Calcium: 8.3 mg/dL — ABNORMAL LOW (ref 8.9–10.3)
Chloride: 88 mmol/L — ABNORMAL LOW (ref 98–111)
Creatinine, Ser: 12.5 mg/dL — ABNORMAL HIGH (ref 0.61–1.24)
GFR, Estimated: 4 mL/min — ABNORMAL LOW (ref >60)
Glucose, Bld: 59 mg/dL — ABNORMAL LOW (ref 70–99)
Potassium: 6.7 mmol/L (ref 3.5–5.1)
Sodium: 128 mmol/L — ABNORMAL LOW (ref 135–145)
Total Bilirubin: 1.1 mg/dL (ref 0.3–1.2)
Total Protein: 5.8 g/dL — ABNORMAL LOW (ref 6.5–8.1)

## 2021-03-02 LAB — I-STAT CHEM 8, ED
BUN: 47 mg/dL — ABNORMAL HIGH (ref 6–20)
Calcium, Ion: 0.97 mmol/L — ABNORMAL LOW (ref 1.15–1.40)
Chloride: 91 mmol/L — ABNORMAL LOW (ref 98–111)
Creatinine, Ser: 12.4 mg/dL — ABNORMAL HIGH (ref 0.61–1.24)
Glucose, Bld: 52 mg/dL — ABNORMAL LOW (ref 70–99)
HCT: 29 % — ABNORMAL LOW (ref 39.0–52.0)
Hemoglobin: 9.9 g/dL — ABNORMAL LOW (ref 13.0–17.0)
Potassium: 6.5 mmol/L (ref 3.5–5.1)
Sodium: 126 mmol/L — ABNORMAL LOW (ref 135–145)
TCO2: 27 mmol/L (ref 22–32)

## 2021-03-02 LAB — CBC WITH DIFFERENTIAL/PLATELET
Abs Immature Granulocytes: 0.04 10*3/uL (ref 0.00–0.07)
Basophils Absolute: 0.1 10*3/uL (ref 0.0–0.1)
Basophils Relative: 1 %
Eosinophils Absolute: 0.1 10*3/uL (ref 0.0–0.5)
Eosinophils Relative: 2 %
HCT: 29.3 % — ABNORMAL LOW (ref 39.0–52.0)
Hemoglobin: 10.3 g/dL — ABNORMAL LOW (ref 13.0–17.0)
Immature Granulocytes: 1 %
Lymphocytes Relative: 12 %
Lymphs Abs: 0.9 10*3/uL (ref 0.7–4.0)
MCH: 27.6 pg (ref 26.0–34.0)
MCHC: 35.2 g/dL (ref 30.0–36.0)
MCV: 78.6 fL — ABNORMAL LOW (ref 80.0–100.0)
Monocytes Absolute: 0.6 10*3/uL (ref 0.1–1.0)
Monocytes Relative: 8 %
Neutro Abs: 5.8 10*3/uL (ref 1.7–7.7)
Neutrophils Relative %: 76 %
Platelets: 187 10*3/uL (ref 150–400)
RBC: 3.73 MIL/uL — ABNORMAL LOW (ref 4.22–5.81)
RDW: 13.7 % (ref 11.5–15.5)
WBC: 7.5 10*3/uL (ref 4.0–10.5)
nRBC: 0 % (ref 0.0–0.2)

## 2021-03-02 LAB — CBG MONITORING, ED
Glucose-Capillary: 69 mg/dL — ABNORMAL LOW (ref 70–99)
Glucose-Capillary: 101 mg/dL — ABNORMAL HIGH (ref 70–99)
Glucose-Capillary: 36 mg/dL — CL (ref 70–99)

## 2021-03-02 LAB — TROPONIN I (HIGH SENSITIVITY): Troponin I (High Sensitivity): 125 ng/L (ref <18)

## 2021-03-02 MED ORDER — LACTATED RINGERS IV BOLUS
500.0000 mL | Freq: Once | INTRAVENOUS | Status: AC
  Administered 2021-03-03: 500 mL via INTRAVENOUS

## 2021-03-02 MED ORDER — CALCIUM GLUCONATE 10 % IV SOLN
1.0000 g | Freq: Once | INTRAVENOUS | Status: AC
  Administered 2021-03-02: 1 g via INTRAVENOUS
  Filled 2021-03-02: qty 10

## 2021-03-02 MED ORDER — ALBUTEROL SULFATE (2.5 MG/3ML) 0.083% IN NEBU
2.5000 mg | INHALATION_SOLUTION | Freq: Once | RESPIRATORY_TRACT | Status: AC
  Administered 2021-03-03: 2.5 mg via RESPIRATORY_TRACT
  Filled 2021-03-02: qty 3

## 2021-03-02 MED ORDER — DEXTROSE 10 % IV BOLUS
250.0000 mL | Freq: Once | INTRAVENOUS | Status: AC
  Administered 2021-03-02: 250 mL via INTRAVENOUS

## 2021-03-02 MED ORDER — CHLORHEXIDINE GLUCONATE CLOTH 2 % EX PADS: 6.0000 | MEDICATED_PAD | Freq: Every day | CUTANEOUS | Status: DC

## 2021-03-02 MED ORDER — SODIUM ZIRCONIUM CYCLOSILICATE 10 G PO PACK
10.0000 g | PACK | Freq: Once | ORAL | Status: AC
  Administered 2021-03-02: 10 g via ORAL
  Filled 2021-03-02: qty 1

## 2021-03-02 MED ORDER — DEXTROSE 10 % IV SOLN: INTRAVENOUS | Status: DC

## 2021-03-02 NOTE — Progress Notes (Signed)
Pt with AMS and ESRD with K of 6.7 and EKG changes. 2hr dialysis treatment ordered. Must have COVID test obtained before his treatment. Temporary measures with lokelma and calcium gluconate given.

## 2021-03-02 NOTE — ED Triage Notes (Signed)
Ems called for Pt outside of residence slow to respond, upon ems arrival Pt was alert but slow to respond but answered questions appropriately. Pt on scene CBG was 44, was given dextrose10 percent by ems CBG upon arrival at ED was 140's on ems meter.

## 2021-03-02 NOTE — ED Provider Notes (Signed)
[facility] EMERGENCY DEPARTMENT Provider Note   CSN: [id number] Arrival date & time: 03/02/21  1931     History Chief Complaint  Patient presents with   Altered Mental Status    [name] is a 60 y.o. male.  Patient is a 61 year old male with a past medical history of ESRD on hemodialysis Monday Wednesday Friday, CAD, hypertension that is presenting for altered mental status.  Patient was found by a neighbor outside of his residence slow to respond.  When EMS reported to the scene he was slow to respond but was able to answer questions appropriately.  He was found to have a point of care glucose of 44.  He was given D10 by EMS with improvement of blood sugars.  Patient states that he was unable to get to dialysis today.  He is complaining of feeling sick.    Patient denies any fevers, chills, cough, congestion, chest pain, shortness of breath, nausea, vomiting, diarrhea, numbness or weakness.  Altered Mental Status Presenting symptoms: confusion   Associated symptoms: no abdominal pain, no fever, no headaches, no light-headedness, no nausea, no palpitations, no rash, no seizures and no vomiting       Past Medical History:  Diagnosis Date   Anemia    Anxiety    Arthritis    left shoulder   Arthritis    Atherosclerosis of aorta (HCC)    Cardiomegaly    Chest pain    DATE UNKNOWN, C/O PERIODICALLY   Chest pain    Cocaine abuse (HCC)    COPD (chronic obstructive pulmonary disease) (HCC)    COPD exacerbation ([place]) 08/17/2016   Coronary artery disease    stent 02/22/17   Coronary artery disease    Dysrhythmia    ESRD (end stage renal disease) ([place])    ESRD (end stage renal disease) on dialysis ([place])    "[name]; M-W-F" (07/04/2017)   GERD (gastroesophageal reflux disease)    DATE UNKNOWN   GERD (gastroesophageal reflux disease)    Hemorrhoids    Hepatitis B    Hepatitis B, chronic (HCC)    Hepatitis C    History of kidney stones     Hyperkalemia    Hypertension    Kidney failure    Metabolic bone disease    Patient denies   Mitral stenosis    Myocardial infarction [facility])    Pneumonia    Pulmonary edema    Renal disorder    Solitary rectal ulcer syndrome 07/2017   at flex sig for rectal bleeding   Solitary rectal ulcer syndrome    Tubular adenoma of colon     Patient Active Problem List   Diagnosis Date Noted   Tylenol overdose 09/07/2020   DNR (do not resuscitate)    CAP (community acquired pneumonia) 02/09/2020   Leukocytosis 01/19/2020   Tobacco use 01/19/2020   Acute pulmonary edema ([place]) 12/21/2019   Acetaminophen overdose 10/27/2019   ESRD (end stage renal disease) ([place])    Goals of care, counseling/discussion    Hypoxia 10/04/2019   Advanced care planning/counseling discussion    Renal failure 09/26/2019   Dyspnea 06/09/2019   Acquired thrombophilia ([place]) 06/05/2019   A-fib ([place]) 05/30/2019   Atrial fibrillation with RVR ([place]) 05/29/2019   Melena    Pressure injury of skin 03/09/2019   Abdominal distention    Volume overload 12/28/2018   Sepsis ([place]) 09/12/2018   Coronary artery disease involving native coronary artery of native heart without angina pectoris  03/11/2018   Benign neoplasm of cecum    Benign neoplasm of ascending colon    Benign neoplasm of descending colon    Benign neoplasm of rectum    AF (paroxysmal atrial fibrillation) ([place]) 01/23/2018   Hx of colonic polyps 01/20/2018   End-stage renal disease on hemodialysis ([place]) 11/21/2017   GERD (gastroesophageal reflux disease) 11/16/2017   Decompensated hepatic cirrhosis ([place]) 11/15/2017   Palliative care by specialist    Hyponatremia 11/04/2017   SBP (spontaneous bacterial peritonitis) ([place]) 10/30/2017   Liver disease, chronic 10/30/2017   SOB (shortness of breath)    Abdominal pain 10/28/2017   Upper airway cough syndrome with flattening on f/v loop 10/13/17 c/w vcd 10/17/2017   Elevated diaphragm 10/13/2017   Ileus ([place])  09/29/2017   QT prolongation 09/29/2017   Malnutrition of moderate degree 09/29/2017   Sinus congestion 09/03/2017   Symptomatic anemia 09/02/2017   Cirrhosis of liver with ascites ([place]) 09/02/2017   Left bundle branch block 09/02/2017   Mitral stenosis 09/02/2017   Hematochezia 07/15/2017   Wide-complex tachycardia ([place])    Endotracheally intubated    ESRD on dialysis ([place]) 07/04/2017   CKD (chronic kidney disease) stage V requiring chronic dialysis ([place]) 06/18/2017   History of Cocaine abuse ([place]) 06/18/2017   Hypertension 06/18/2017   Infection of AV graft for dialysis ([place]) 06/18/2017   Anxiety 06/18/2017   Anemia due to chronic kidney disease 06/18/2017   Atypical atrial flutter ([place]) 06/18/2017   Personality disorder ([place]) 06/13/2017   Cellulitis 06/12/2017   Adjustment disorder with mixed anxiety and depressed mood 06/10/2017   Suicidal ideation 06/10/2017   Arm wound, left, sequela 06/10/2017   Dyspnea on exertion 05/29/2017   Tachycardia 05/29/2017   Hyperkalemia 32/20/2542   Acute metabolic encephalopathy    Anemia 04/23/2017   Ascites 04/23/2017   COPD (chronic obstructive pulmonary disease) ([place]) 04/23/2017   Acute on chronic respiratory failure with hypoxia ([place]) 03/25/2017   Arrhythmia 03/25/2017   COPD GOLD 0 with flattening on inps f/v  09/27/2016   Essential hypertension 09/27/2016   Fluid overload 08/30/2016   COPD exacerbation ([place]) 08/17/2016   Hypertensive urgency 08/17/2016   Problem with dialysis access ([place]) 07/23/2016   Chronic hepatitis B ([place]) 03/05/2014   Chronic hepatitis C without hepatic coma ([place]) 03/05/2014   Internal hemorrhoids with bleeding, swelling and itching 03/05/2014   Thrombocytopenia ([place]) 03/05/2014   Chest pain 02/27/2014   Alcohol abuse 04/14/2009   Nicotine dependence, cigarettes, uncomplicated 70/62/3762   GANGLION CYST 04/14/2009    Past Surgical History:  Procedure Laterality Date   A/V FISTULAGRAM Left 05/26/2017    Procedure: A/V FISTULAGRAM;  Surgeon: Conrad , MD;  Location: [place] [facility];  Service: Cardiovascular;  Laterality: Left;   A/V FISTULAGRAM Right 11/18/2017   Procedure: A/V FISTULAGRAM - Right Arm;  Surgeon: Elam Dutch, MD;  Location: [place] [facility];  Service: Cardiovascular;  Laterality: Right;   AMPUTATION Right 09/04/2020   Procedure: RIGHT INDEX AND RIGHT RING FINGER AMPUTATION DIGIT;  Surgeon: Leanora Cover, MD;  Location: [place];  Service: Orthopedics;  Laterality: Right;   AMPUTATION Right 11/13/2020   Procedure: RIGHT INDEX FINGER AMPUTATION AND RIGHT RING FINGER AMPUTATION;  Surgeon: Leanora Cover, MD;  Location: [place];  Service: Orthopedics;  Laterality: Right;   APPLICATION OF WOUND VAC Left 06/14/2017   Procedure: APPLICATION OF WOUND VAC;  Surgeon: Katha Cabal, MD;  Location: [facility];  Service: Vascular;  Laterality: Left;  AV FISTULA PLACEMENT  2012   BELIEVED WAS PLACED IN JUNE   AV FISTULA PLACEMENT Right 08/09/2017   Procedure: Creation Right arm ARTERIOVENOUS BRACHIOCEPOHALIC FISTULA;  Surgeon: Elam Dutch, MD;  Location: [facility];  Service: Vascular;  Laterality: Right;   AV FISTULA PLACEMENT Right 11/22/2017   Procedure: INSERTION OF ARTERIOVENOUS (AV) GORE-TEX GRAFT RIGHT UPPER ARM;  Surgeon: Elam Dutch, MD;  Location: [place];  Service: Vascular;  Laterality: Right;   BIOPSY  01/25/2018   Procedure: BIOPSY;  Surgeon: Jerene Bears, MD;  Location: [place];  Service: Gastroenterology;;   BIOPSY  04/10/2019   Procedure: BIOPSY;  Surgeon: Jerene Bears, MD;  Location: [facility];  Service: Gastroenterology;;   COLONOSCOPY     COLONOSCOPY WITH PROPOFOL N/A 01/25/2018   Procedure: COLONOSCOPY WITH PROPOFOL;  Surgeon: Jerene Bears, MD;  Location: [place];  Service: Gastroenterology;  Laterality: N/A;   CORONARY STENT INTERVENTION N/A 02/22/2017   Procedure: CORONARY STENT INTERVENTION;  Surgeon: Nigel Mormon, MD;  Location: [place] [facility];  Service: Cardiovascular;  Laterality: N/A;   ESOPHAGOGASTRODUODENOSCOPY (EGD) WITH PROPOFOL N/A 01/25/2018   Procedure: ESOPHAGOGASTRODUODENOSCOPY (EGD) WITH PROPOFOL;  Surgeon: Jerene Bears, MD;  Location: [place];  Service: Gastroenterology;  Laterality: N/A;   ESOPHAGOGASTRODUODENOSCOPY (EGD) WITH PROPOFOL N/A 04/10/2019   Procedure: ESOPHAGOGASTRODUODENOSCOPY (EGD) WITH PROPOFOL;  Surgeon: Jerene Bears, MD;  [facility]: [facility];  Service: Gastroenterology;  Laterality: N/A;   FLEXIBLE SIGMOIDOSCOPY N/A 07/15/2017   Procedure: FLEXIBLE SIGMOIDOSCOPY;  Surgeon: Carol Ada, MD;  Location: [place];  Service: Endoscopy;  Laterality: N/A;   HEMORRHOID BANDING     I & D EXTREMITY Left 06/01/2017   Procedure: IRRIGATION AND DEBRIDEMENT LEFT ARM HEMATOMA WITH LIGATION OF LEFT ARM AV FISTULA;  Surgeon: Elam Dutch, MD;  Location: [place];  Service: Vascular;  Laterality: Left;   I & D EXTREMITY Left 06/14/2017   Procedure: IRRIGATION AND DEBRIDEMENT EXTREMITY;  Surgeon: Katha Cabal, MD;  Location: [facility];  Service: Vascular;  Laterality: Left;   INSERTION OF DIALYSIS CATHETER  05/30/2017   INSERTION OF DIALYSIS CATHETER N/A 05/30/2017   Procedure: INSERTION OF DIALYSIS CATHETER;  Surgeon: Elam Dutch, MD;  Location: [place];  Service: Vascular;  Laterality: N/A;   IR PARACENTESIS  08/30/2017   IR PARACENTESIS  09/29/2017   IR PARACENTESIS  10/28/2017   IR PARACENTESIS  11/09/2017   IR PARACENTESIS  11/16/2017   IR PARACENTESIS  11/28/2017   IR PARACENTESIS  12/01/2017   IR PARACENTESIS  12/06/2017   IR PARACENTESIS  01/03/2018   IR PARACENTESIS  01/23/2018   IR PARACENTESIS  02/07/2018   IR PARACENTESIS  02/21/2018   IR PARACENTESIS  03/06/2018   IR PARACENTESIS  03/17/2018   IR PARACENTESIS  04/04/2018   IR PARACENTESIS  12/28/2018   IR PARACENTESIS  01/08/2019   IR PARACENTESIS  01/23/2019   IR PARACENTESIS  02/01/2019   IR PARACENTESIS  02/19/2019   IR  PARACENTESIS  03/01/2019   IR PARACENTESIS  03/15/2019   IR PARACENTESIS  04/03/2019   IR PARACENTESIS  04/12/2019   IR PARACENTESIS  05/01/2019   IR PARACENTESIS  05/08/2019   IR PARACENTESIS  05/24/2019   IR PARACENTESIS  06/12/2019   IR PARACENTESIS  07/09/2019   IR PARACENTESIS  07/27/2019   IR PARACENTESIS  08/09/2019   IR PARACENTESIS  08/21/2019   IR PARACENTESIS  09/17/2019   IR PARACENTESIS  10/05/2019  IR PARACENTESIS  10/29/2019   IR PARACENTESIS  11/08/2019   IR PARACENTESIS  12/12/2019   IR PARACENTESIS  01/03/2020   IR PARACENTESIS  01/10/2020   IR PARACENTESIS  01/17/2020   IR PARACENTESIS  01/24/2020   IR PARACENTESIS  01/31/2020   IR PARACENTESIS  02/07/2020   IR PARACENTESIS  02/21/2020   IR PARACENTESIS  05/21/2020   IR PARACENTESIS  10/10/2020   IR PARACENTESIS  10/28/2020   IR PARACENTESIS  11/18/2020   IR PARACENTESIS  12/02/2020   IR PARACENTESIS  12/11/2020   IR PARACENTESIS  12/18/2020   IR PARACENTESIS  12/30/2020   IR PARACENTESIS  01/06/2021   IR PARACENTESIS  01/13/2021   IR PARACENTESIS  01/20/2021   IR PARACENTESIS  01/27/2021   IR PARACENTESIS  02/03/2021   IR PARACENTESIS  02/10/2021   IR PARACENTESIS  02/17/2021   IR PARACENTESIS  02/24/2021   IR RADIOLOGIST EVAL & MGMT  02/14/2018   IR RADIOLOGIST EVAL & MGMT  02/22/2019   LEFT HEART CATH AND CORONARY ANGIOGRAPHY N/A 02/22/2017   Procedure: LEFT HEART CATH AND CORONARY ANGIOGRAPHY;  Surgeon: Nigel Mormon, MD;  Location: [place] [facility];  Service: Cardiovascular;  Laterality: N/A;   LIGATION OF ARTERIOVENOUS  FISTULA Left 02/17/2118   Procedure: Plication of Left Arm Arteriovenous Fistula;  Surgeon: Elam Dutch, MD;  Location: [facility];  Service: Vascular;  Laterality: Left;   POLYPECTOMY     POLYPECTOMY  01/25/2018   Procedure: POLYPECTOMY;  Surgeon: Jerene Bears, MD;  Location: [place];  Service: Gastroenterology;;   REVISON OF ARTERIOVENOUS FISTULA Left 10/27/4079   Procedure: PLICATION OF DISTAL ANEURYSMAL  SEGEMENT OF LEFT UPPER ARM ARTERIOVENOUS FISTULA;  Surgeon: Elam Dutch, MD;  Location: [place];  Service: Vascular;  Laterality: Left;   REVISON OF ARTERIOVENOUS FISTULA Left 4/48/1856   Procedure: Plication of Left Upper Arm Fistula ;  Surgeon: Waynetta Sandy, MD;  Location: [place];  Service: Vascular;  Laterality: Left;   SKIN GRAFT SPLIT THICKNESS LEG / FOOT Left    SKIN GRAFT SPLIT THICKNESS LEFT ARM DONOR SITE: LEFT ANTERIOR THIGH   SKIN SPLIT GRAFT Left 07/04/2017   Procedure: SKIN GRAFT SPLIT THICKNESS LEFT ARM DONOR SITE: LEFT ANTERIOR THIGH;  Surgeon: Elam Dutch, MD;  Location: [place];  Service: Vascular;  Laterality: Left;   THROMBECTOMY W/ EMBOLECTOMY Left 06/05/2017   Procedure: EXPLORATION OF LEFT ARM FOR BLEEDING; OVERSEWED PROXIMAL FISTULA;  Surgeon: Angelia Mould, MD;  Location: [facility] OR;  Service: Vascular;  Laterality: Left;   WOUND EXPLORATION Left 06/03/2017   Procedure: WOUND EXPLORATION WITH WOUND VAC APPLICATION TO LEFT ARM;  Surgeon: Angelia Mould, MD;  Location: [facility] OR;  Service: Vascular;  Laterality: Left;       Family History  Problem Relation Age of Onset   Heart disease Mother    Lung cancer Mother    Heart disease Father    Malignant hyperthermia Father    COPD Father    Throat cancer Sister    Esophageal cancer Sister    Hypertension Other    COPD Other    Throat cancer Sister    Colon cancer Neg Hx    Colon polyps Neg Hx    Rectal cancer Neg Hx    Stomach cancer Neg Hx     Social History   Tobacco Use   Smokeless tobacco: Never  Vaping Use   Vaping Use: Never used  Substance Use Topics  Alcohol use: Not Currently    Comment: quit drinking in 2017   Drug use: Yes    Types: Marijuana, Cocaine    Comment:  Quit Cocaine 2020, Last use Marijuana 11/08/20    Home Medications Prior to Admission medications   Medication Sig Start Date End Date Taking? Authorizing Provider  acetaminophen (TYLENOL) 500 MG tablet  Take 500-1,000 mg by mouth every 6 (six) hours as needed (pain.).    [provider]  albuterol (VENTOLIN HFA) 108 (90 Base) MCG/ACT inhaler INHALE 2 PUFFS BY MOUTH EVERY 4 HOURS AS NEEDED FOR WHEEZE OR FOR SHORTNESS OF BREATH 02/06/21   Cantwell, Celeste C, PA-C  amiodarone (PACERONE) 200 MG tablet TAKE 1 TABLET BY MOUTH EVERY DAY Patient taking differently: Take 200 mg by mouth in the morning. 09/29/20   Cantwell, Celeste C, PA-C  amiodarone (PACERONE) 200 MG tablet Take 200 mg by mouth daily.    [provider]  cetirizine (ZYRTEC) 10 MG tablet Take 10 mg by mouth daily as needed for allergies. 06/23/20   [provider]  cinacalcet (SENSIPAR) 30 MG tablet TAKE 2 TABLETS (60 MG TOTAL) BY MOUTH EVERY MONDAY, WEDNESDAY, AND FRIDAY WITH HEMODIALYSIS. 09/12/20 09/12/21  Florencia Reasons, MD  diltiazem (CARDIZEM CD) 120 MG 24 hr capsule TAKE 1 CAPSULE BY MOUTH EVERY DAY 12/15/20   Adrian Prows, MD  doxycycline (VIBRAMYCIN) 100 MG capsule Take 100 mg by mouth 2 (two) times daily. 11/05/20   [provider]  gabapentin (NEURONTIN) 300 MG capsule TAKE 1 CAPSULE BY MOUTH IN THE MORNING AND AT BEDTIME 11/14/20   Charlott Rakes, MD  lactulose (CHRONULAC) 10 GM/15ML solution Take 30 mLs (20 g total) by mouth daily. Patient taking differently: Take 20 g by mouth daily as needed (constipation). 09/12/20   Florencia Reasons, MD  lidocaine-prilocaine (EMLA) cream Apply 1 application topically every Monday, Wednesday, and Friday with hemodialysis. Applied sparingly for port access 11/01/20   [provider]  metoprolol succinate (TOPROL-XL) 25 MG 24 hr tablet Take 0.5 tablets (12.5 mg total) by mouth daily. 01/29/21   Cantwell, Celeste C, PA-C  ondansetron (ZOFRAN) 4 MG tablet TAKE 1 TABLET BY MOUTH EVERY 8 HOURS AS NEEDED FOR NAUSEA AND VOMITING 12/15/20   Charlott Rakes, MD  oxyCODONE (ROXICODONE) 5 MG immediate release tablet 1 tab PO q6 hours prn pain 11/13/20   Leanora Cover, MD  Oxycodone HCl 10 MG  TABS TAKE 1 TABLET (10 MG TOTAL) BY MOUTH THREE TIMES DAILY AS NEEDED PAIN. Patient taking differently: Take 10 mg by mouth 3 (three) times daily as needed (pain). 09/12/20 03/11/21  Florencia Reasons, MD  Oxycodone HCl 10 MG TABS Take 10 mg by mouth 3 (three) times daily as needed. pain    [provider]  pantoprazole (PROTONIX) 40 MG tablet Take 40 mg by mouth daily.    [provider]  polyethylene glycol (MIRALAX / GLYCOLAX) 17 g packet Take 17 g by mouth daily as needed (constipation).    [provider]  sevelamer carbonate (RENVELA) 800 MG tablet Take 800 mg by mouth 3 (three) times daily with meals.    [provider]    Allergies    Tramadol, Grass extracts [gramineae pollens], Morphine and related, Pollen extract, Acetaminophen, Aspirin, and Clonidine derivatives  Review of Systems   Review of Systems  Constitutional:  Negative for chills, diaphoresis, fatigue and fever.  HENT:  Negative for congestion, dental problem, ear pain, facial swelling, hearing loss, nosebleeds, postnasal drip, rhinorrhea, sore  throat and trouble swallowing.   Eyes:  Negative for photophobia, pain and visual disturbance.  Respiratory:  Negative for apnea, cough, choking, chest tightness, shortness of breath, wheezing and stridor.   Cardiovascular:  Negative for chest pain, palpitations and leg swelling.  Gastrointestinal:  Negative for abdominal distention, abdominal pain, constipation, diarrhea, nausea and vomiting.  Endocrine: Negative for polydipsia and polyuria.  Genitourinary:  Negative for difficulty urinating, dysuria, flank pain, frequency, hematuria and urgency.  Musculoskeletal:  Negative for gait problem, myalgias, neck pain and neck stiffness.  Skin:  Negative for rash and wound.  Allergic/Immunologic: Negative for environmental allergies and food allergies.  Neurological:  Negative for dizziness, tremors, seizures, syncope, facial asymmetry, speech difficulty,  light-headedness, numbness and headaches.  Psychiatric/Behavioral:  Positive for confusion. Negative for behavioral problems.   All other systems reviewed and are negative.  Physical Exam Updated Vital Signs BP (!) 83/71 (BP Location: Left Arm)   Pulse 100   Temp 98.9 F (37.2 C) (Oral)   Resp (!) 22   Ht 5\' 9"  (1.753 m)   Wt 68.9 kg   SpO2 97%   BMI 22.45 kg/m   Physical Exam Vitals and nursing note reviewed.  Constitutional:      General: He is not in acute distress.    Appearance: Normal appearance. He is normal weight.  HENT:     Head: Normocephalic and atraumatic.     Right Ear: External ear normal.     Left Ear: External ear normal.     Nose: Nose normal. No congestion.     Mouth/Throat:     Mouth: Mucous membranes are moist.     Pharynx: Oropharynx is clear. No oropharyngeal exudate or posterior oropharyngeal erythema.  Eyes:     General: No visual field deficit.    Extraocular Movements: Extraocular movements intact.     Conjunctiva/sclera: Conjunctivae normal.     Pupils: Pupils are equal, round, and reactive to light.  Cardiovascular:     Rate and Rhythm: Normal rate and regular rhythm.     Pulses: Normal pulses.     Heart sounds: Normal heart sounds. No murmur heard.   No friction rub. No gallop.  Pulmonary:     Effort: Pulmonary effort is normal. No respiratory distress.     Breath sounds: Normal breath sounds. No stridor. No wheezing, rhonchi or rales.  Chest:     Chest wall: No tenderness.  Abdominal:     General: Abdomen is flat. Bowel sounds are normal. There is no distension.     Palpations: Abdomen is soft.     Tenderness: There is no abdominal tenderness. There is no right CVA tenderness, left CVA tenderness, guarding or rebound.  Musculoskeletal:        General: No swelling or tenderness. Normal range of motion.     Cervical back: Normal range of motion and neck supple. No rigidity, tenderness or bony tenderness.     Thoracic back: Normal. No  tenderness or bony tenderness.     Lumbar back: Normal. No tenderness or bony tenderness.     Right lower leg: No edema.     Left lower leg: No edema.  Skin:    General: Skin is warm and dry.  Neurological:     General: No focal deficit present.     Mental Status: He is alert and oriented to person, place, and time. Mental status is at baseline.     Cranial Nerves: Cranial nerves are intact. No cranial nerve deficit, dysarthria  or facial asymmetry.     Sensory: Sensation is intact. No sensory deficit.     Motor: Motor function is intact. No weakness.     Coordination: Coordination is intact. Finger-Nose-Finger Test normal.     Gait: Gait is intact. Gait normal.  Psychiatric:        Mood and Affect: Mood normal.        Behavior: Behavior normal.        Thought Content: Thought content normal.        Judgment: Judgment normal.    ED Results / Procedures / Treatments   Labs (all labs ordered are listed, but only abnormal results are displayed) Labs Reviewed - No data to display  EKG None  Radiology No results found.  Procedures Procedures   Medications Ordered in ED Medications - No data to display  ED Course  I have reviewed the triage vital signs and the nursing notes.  Pertinent labs & imaging results that were available during my care of the patient were reviewed by me and considered in my medical decision making (see chart for details).    MDM Rules/Calculators/A&P                         [name] is a 60 y.o. male ith a past medical history of ESRD on hemodialysis Monday Wednesday Friday, CAD, hypertension that is presenting for altered mental status.  Patient was found slow to respond by EMS and neighbor. He was hypoglycemic that resolved after receiving glucose. On arrival he is hemodynamically stable and in no acute distress. He is tired and not feeling well. He states that he did not go to dialysis. He appears volume overload on exam. Neurological  exam shows no focal neuro deficit. Initial glucose on arrival was 69 and he was given d10 bolus. CBC showed a stable hemoglobin. CMP showed a potassium of 6.7 with peaked T waves on EKG. He was given calcium gluconate. Nephrology was consulted for emergent dialysis. We did not given insulin due to hypoglyemia. We gave albuterol and lokelma. Initial troponin was 125 to 114. CXR showed vascular congestion and trace pleural effusion. CT head showed no actue intracranial abnormality.   Patient's glucose improved initially to 101 but then went down to 36. He was given a d 10 bolus and then started on a D10 infusion. He states that he feels fine and would like to eat. He has had an improvement in his mental status.   Patient admitted to hospitalist for further evaluation.   Patient states compliance and understanding of the plan. I explained labs and imaging to the patient. No further questions at this time from the patient.  The plan for this patient was discussed with Dr. Vanita Panda, who voiced agreement and who oversaw evaluation and treatment of this patient.   Final Clinical Impression(s) / ED Diagnoses Final diagnoses:  Altered mental status, unspecified altered mental status type  Hypoglycemia    Rx / DC Orders ED Discharge Orders     None        Doretha Sou, MD 03/03/21 1157    Carmin Muskrat, MD 03/04/21 2352

## 2021-03-03 ENCOUNTER — Encounter (HOSPITAL_COMMUNITY): Payer: Self-pay

## 2021-03-03 ENCOUNTER — Encounter (HOSPITAL_COMMUNITY): Payer: Self-pay | Admitting: Internal Medicine

## 2021-03-03 ENCOUNTER — Inpatient Hospital Stay (HOSPITAL_COMMUNITY)
Admission: RE | Admit: 2021-03-03 | Discharge: 2021-03-03 | Disposition: A | Discharge status: Home / Self Care | Payer: Medicare Other | Source: Ambulatory Visit | Attending: Nephrology | Admitting: Nephrology

## 2021-03-03 DIAGNOSIS — R188 Other ascites: Secondary | ICD-10-CM | POA: Diagnosis present

## 2021-03-03 DIAGNOSIS — R4182 Altered mental status, unspecified: Secondary | ICD-10-CM | POA: Diagnosis present

## 2021-03-03 DIAGNOSIS — F17210 Nicotine dependence, cigarettes, uncomplicated: Secondary | ICD-10-CM | POA: Diagnosis present

## 2021-03-03 DIAGNOSIS — N186 End stage renal disease: Secondary | ICD-10-CM | POA: Diagnosis present

## 2021-03-03 DIAGNOSIS — I2781 Cor pulmonale (chronic): Secondary | ICD-10-CM | POA: Diagnosis present

## 2021-03-03 DIAGNOSIS — I120 Hypertensive chronic kidney disease with stage 5 chronic kidney disease or end stage renal disease: Secondary | ICD-10-CM | POA: Diagnosis present

## 2021-03-03 DIAGNOSIS — I700 Atherosclerosis of aorta: Secondary | ICD-10-CM | POA: Diagnosis present

## 2021-03-03 DIAGNOSIS — D631 Anemia in chronic kidney disease: Secondary | ICD-10-CM | POA: Diagnosis present

## 2021-03-03 DIAGNOSIS — N2581 Secondary hyperparathyroidism of renal origin: Secondary | ICD-10-CM | POA: Diagnosis present

## 2021-03-03 DIAGNOSIS — B181 Chronic viral hepatitis B without delta-agent: Secondary | ICD-10-CM | POA: Diagnosis present

## 2021-03-03 DIAGNOSIS — Z992 Dependence on renal dialysis: Secondary | ICD-10-CM | POA: Diagnosis not present

## 2021-03-03 DIAGNOSIS — K7010 Alcoholic hepatitis without ascites: Secondary | ICD-10-CM | POA: Diagnosis present

## 2021-03-03 DIAGNOSIS — B1920 Unspecified viral hepatitis C without hepatic coma: Secondary | ICD-10-CM | POA: Diagnosis present

## 2021-03-03 DIAGNOSIS — J449 Chronic obstructive pulmonary disease, unspecified: Secondary | ICD-10-CM | POA: Diagnosis present

## 2021-03-03 DIAGNOSIS — K219 Gastro-esophageal reflux disease without esophagitis: Secondary | ICD-10-CM | POA: Diagnosis present

## 2021-03-03 DIAGNOSIS — E162 Hypoglycemia, unspecified: Secondary | ICD-10-CM | POA: Diagnosis present

## 2021-03-03 DIAGNOSIS — I252 Old myocardial infarction: Secondary | ICD-10-CM | POA: Diagnosis not present

## 2021-03-03 DIAGNOSIS — I2510 Atherosclerotic heart disease of native coronary artery without angina pectoris: Secondary | ICD-10-CM | POA: Diagnosis present

## 2021-03-03 DIAGNOSIS — I4891 Unspecified atrial fibrillation: Secondary | ICD-10-CM | POA: Diagnosis present

## 2021-03-03 DIAGNOSIS — E871 Hypo-osmolality and hyponatremia: Secondary | ICD-10-CM | POA: Diagnosis not present

## 2021-03-03 DIAGNOSIS — G9340 Encephalopathy, unspecified: Secondary | ICD-10-CM | POA: Diagnosis present

## 2021-03-03 DIAGNOSIS — Z20822 Contact with and (suspected) exposure to covid-19: Secondary | ICD-10-CM | POA: Diagnosis present

## 2021-03-03 DIAGNOSIS — K7460 Unspecified cirrhosis of liver: Secondary | ICD-10-CM | POA: Diagnosis present

## 2021-03-03 DIAGNOSIS — E875 Hyperkalemia: Secondary | ICD-10-CM | POA: Diagnosis present

## 2021-03-03 LAB — CBC
HCT: 29.3 % — ABNORMAL LOW (ref 39.0–52.0)
Hemoglobin: 10.3 g/dL — ABNORMAL LOW (ref 13.0–17.0)
MCH: 27.2 pg (ref 26.0–34.0)
MCHC: 35.2 g/dL (ref 30.0–36.0)
MCV: 77.5 fL — ABNORMAL LOW (ref 80.0–100.0)
Platelets: 178 10*3/uL (ref 150–400)
RBC: 3.78 MIL/uL — ABNORMAL LOW (ref 4.22–5.81)
RDW: 13.8 % (ref 11.5–15.5)
WBC: 6.9 10*3/uL (ref 4.0–10.5)
nRBC: 0 % (ref 0.0–0.2)

## 2021-03-03 LAB — FOLATE: Folate: 10 ng/mL (ref >5.9)

## 2021-03-03 LAB — VITAMIN B12: Vitamin B-12: 1353 pg/mL — ABNORMAL HIGH (ref 180–914)

## 2021-03-03 LAB — RENAL FUNCTION PANEL
Albumin: 2.1 g/dL — ABNORMAL LOW (ref 3.5–5.0)
Anion gap: 14 (ref 5–15)
BUN: 52 mg/dL — ABNORMAL HIGH (ref 6–20)
CO2: 25 mmol/L (ref 22–32)
Calcium: 8.1 mg/dL — ABNORMAL LOW (ref 8.9–10.3)
Chloride: 85 mmol/L — ABNORMAL LOW (ref 98–111)
Creatinine, Ser: 12.34 mg/dL — ABNORMAL HIGH (ref 0.61–1.24)
GFR, Estimated: 4 mL/min — ABNORMAL LOW (ref >60)
Glucose, Bld: 182 mg/dL — ABNORMAL HIGH (ref 70–99)
Phosphorus: 7.3 mg/dL — ABNORMAL HIGH (ref 2.5–4.6)
Potassium: 6 mmol/L — ABNORMAL HIGH (ref 3.5–5.1)
Sodium: 124 mmol/L — ABNORMAL LOW (ref 135–145)

## 2021-03-03 LAB — TROPONIN I (HIGH SENSITIVITY): Troponin I (High Sensitivity): 114 ng/L (ref <18)

## 2021-03-03 LAB — AMMONIA: Ammonia: 23 umol/L (ref 9–35)

## 2021-03-03 LAB — CBG MONITORING, ED
Glucose-Capillary: 109 mg/dL — ABNORMAL HIGH (ref 70–99)
Glucose-Capillary: 117 mg/dL — ABNORMAL HIGH (ref 70–99)
Glucose-Capillary: 78 mg/dL (ref 70–99)
Glucose-Capillary: 80 mg/dL (ref 70–99)
Glucose-Capillary: 81 mg/dL (ref 70–99)
Glucose-Capillary: 85 mg/dL (ref 70–99)
Glucose-Capillary: 96 mg/dL (ref 70–99)

## 2021-03-03 LAB — CREATININE, SERUM
Creatinine, Ser: 12.52 mg/dL — ABNORMAL HIGH (ref 0.61–1.24)
GFR, Estimated: 4 mL/min — ABNORMAL LOW (ref >60)

## 2021-03-03 LAB — SALICYLATE LEVEL: Salicylate Lvl: <7 mg/dL — ABNORMAL LOW (ref 7.0–30.0)

## 2021-03-03 LAB — RESP PANEL BY RT-PCR (FLU A&B, COVID) ARPGX2
Influenza A by PCR: NEGATIVE
Influenza B by PCR: NEGATIVE
SARS Coronavirus 2 by RT PCR: NEGATIVE

## 2021-03-03 LAB — ACETAMINOPHEN LEVEL: Acetaminophen (Tylenol), Serum: <10 ug/mL — ABNORMAL LOW (ref 10–30)

## 2021-03-03 LAB — CORTISOL: Cortisol, Plasma: 9.6 ug/dL

## 2021-03-03 LAB — HIV ANTIBODY (ROUTINE TESTING W REFLEX): HIV Screen 4th Generation wRfx: NONREACTIVE

## 2021-03-03 MED ORDER — DEXTROSE 10 % IV SOLN: INTRAVENOUS | Status: DC

## 2021-03-03 MED ORDER — THIAMINE HCL 100 MG/ML IJ SOLN
100.0000 mg | Freq: Every day | INTRAMUSCULAR | Status: DC
  Administered 2021-03-03: 100 mg via INTRAVENOUS
  Filled 2021-03-03: qty 2

## 2021-03-03 MED ORDER — HEPARIN SODIUM (PORCINE) 5000 UNIT/ML IJ SOLN
5000.0000 [IU] | Freq: Three times a day (TID) | INTRAMUSCULAR | Status: DC
  Administered 2021-03-03: 5000 [IU] via SUBCUTANEOUS
  Filled 2021-03-03 (×2): qty 1

## 2021-03-03 MED ORDER — CALCITRIOL 0.5 MCG PO CAPS: 1.5000 ug | ORAL_CAPSULE | ORAL | Status: DC

## 2021-03-03 NOTE — ED Notes (Signed)
Pt now requesting to stay and saying that he will go to dialysis. HD called and notified that pt has been moved to the bottom of the list due to not going at scheduled time. MD made aware

## 2021-03-03 NOTE — ED Notes (Signed)
Pt refusing to go to HD.

## 2021-03-03 NOTE — Progress Notes (Signed)
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 fib, anemia, polysubstance abuse who was brought to the emergency department after he was found to be confused from home.  Patient lives alone.  EMS was called by the patient's neighbor.  On presentation blood sugars were low and he was given D10.  CT head was unremarkable.  X-ray of the chest showed congestion.  Potassium was 6.7.  EKG showed peaked T waves.  Patient had missed dialysis on Monday.  Patient was given Lokelma, calcium gluconate for hyperkalemia.  Nephrology consulted. Patient seen and examined at the bedside this morning.  He was hemodynamically stable during my evaluation. On room air,denied any complains. He was alert and oriented.  Blood sugars have improved.  He was initially adamant about being discharged and get dialyzed on his own dialysis facility but since he did not have a ride,he agreed to stay.  Nephrology planning for dialysis today.  If he remains hemodynamically stable, will plan for discharge tomorrow morning.

## 2021-03-03 NOTE — Consult Note (Signed)
ESRD Consult Note  Requesting provider: Shelly Coss, MD Service requesting consult: Hospitalist Reason for consult: ESRD, provision of dialysis Indication for acute dialysis?: End Stage Renal Disease  Outpatient dialysis unit: [facility] Outpatient dialysis schedule: MWF  OP script: F180, BFR 450,  K2, CA 2, 4 hours, EDW 69, AVG 1.5 MCG calcitriol w/ each treatment  Assessment/Recommendations: [name] is a/an 60 y.o. male with a past medical history notable for ESRD on HD admitted with AMS and hyperkalemia.   # ESRD: plan for HD today based on prescription as above. Continue MWF as able. # Volume/ hypertension: EDW 69kg. BP borderline low. Appears euvolemic. Stop d10 given glucose improved. Midodrine and albumin if needed on HD. # Anemia of Chronic Kidney Disease: Hemoglobin 10.3. No ESA # Secondary Hyperparathyroidism/Hyperphosphatemia: Phos 7.3. Continue home binders and calcitriol. # Vascular access: AVG with no issues # AMS: likely from hypoglycemia.  Now improved #Hyperkalemia: HD as above #Hyponatremia:  sodium 128 on arrival now 124.  Likely related to free water administration with dextrose. Stop d10. Will improve with HD #History of Hep B/C: caution on HD, isolation.   # Additional recommendations: - Dose all meds for creatinine clearance < 10 ml/min  - Unless absolutely necessary, no MRIs with gadolinium.  - Implement save arm precautions.  Prefer needle sticks in the dorsum of the hands or wrists.  No blood pressure measurements in arm. - If blood transfusion is requested during hemodialysis sessions, please alert [place] prior to the session.   Recommendations were discussed with the primary team.   History of Present Illness: [name] is a/an 60 y.o. male with a past medical history of ESRD who presents with AMS.  Patient presented to the emergency department with altered mental status.  He was found outside by a neighbor who states the patient  was slow to respond and appeared confused.  When EMS arrived the patient was able to answer some questions appropriately but did seem confused.  His glucose was found to be 44 and he was given D10.  The patient was able to relate that he did not get dialysis on 8/22.  Based on outside record review it appears the patient last had dialysis on 8/17 and this was a short treatment.  On arrival to the hospital he was feeling sick but unable to provide much further history.  Labs on admission demonstrated sodium of 128 and potassium of 6.5.  He was treated with calcium gluconate as well as leukoma.  Plan was for dialysis but this was delayed due to the patient's history of hepatitis B requiring isolation.patient was also noted to have hypotension and was given about 1 L of fluid.  On interview the patient has difficulty answering questions consistently.  He states that he does not want dialysis.  When we discussed that this is life-threatening he puts off the issue.  Does not answer most questions directly.   Medications:  Current Facility-Administered Medications  Medication Dose Route Frequency Provider Last Rate Last Admin   Chlorhexidine Gluconate Cloth 2 % PADS 6 each  6 each Topical Q0600 Rise Patience, MD       dextrose 10 % infusion   Intravenous Continuous Rise Patience, MD 75 mL/hr at 03/03/21 0146 New Bag at 03/03/21 0146   heparin injection 5,000 Units  5,000 Units Subcutaneous Q8H Rise Patience, MD   5,000 Units at 03/03/21 0145   thiamine (B-1) injection 100 mg  100 mg Intravenous Daily Gean Birchwood  N, MD   100 mg at 03/03/21 0251   Current Outpatient Medications  Medication Sig Dispense Refill   lidocaine-prilocaine (EMLA) cream Apply 1 application topically every Monday, Wednesday, and Friday with hemodialysis. Applied sparingly for port access     acetaminophen (TYLENOL) 500 MG tablet Take 500-1,000 mg by mouth every 6 (six) hours as needed (pain.).      albuterol (VENTOLIN HFA) 108 (90 Base) MCG/ACT inhaler INHALE 2 PUFFS BY MOUTH EVERY 4 HOURS AS NEEDED FOR WHEEZE OR FOR SHORTNESS OF BREATH (Patient taking differently: Inhale 2 puffs into the lungs every 4 (four) hours as needed for wheezing or shortness of breath.) 8.5 each 1   amiodarone (PACERONE) 200 MG tablet TAKE 1 TABLET BY MOUTH EVERY DAY (Patient taking differently: Take 200 mg by mouth in the morning.) 90 tablet 1   cetirizine (ZYRTEC) 10 MG tablet Take 10 mg by mouth daily as needed for allergies.     cinacalcet (SENSIPAR) 30 MG tablet TAKE 2 TABLETS (60 MG TOTAL) BY MOUTH EVERY MONDAY, WEDNESDAY, AND FRIDAY WITH HEMODIALYSIS. (Patient taking differently: Take 60 mg by mouth See admin instructions. Monday,Wednesday and friday) 60 tablet 0   diltiazem (CARDIZEM CD) 120 MG 24 hr capsule TAKE 1 CAPSULE BY MOUTH EVERY DAY (Patient taking differently: Take 120 mg by mouth daily.) 90 capsule 3   doxycycline (VIBRAMYCIN) 100 MG capsule Take 100 mg by mouth 2 (two) times daily.     gabapentin (NEURONTIN) 300 MG capsule TAKE 1 CAPSULE BY MOUTH IN THE MORNING AND AT BEDTIME (Patient taking differently: Take 300 mg by mouth 2 (two) times daily.) 60 capsule 3   lactulose (CHRONULAC) 10 GM/15ML solution Take 30 mLs (20 g total) by mouth daily. (Patient taking differently: Take 20 g by mouth daily as needed (constipation).) 236 mL 0   metoprolol succinate (TOPROL-XL) 25 MG 24 hr tablet Take 0.5 tablets (12.5 mg total) by mouth daily. 30 tablet 3   ondansetron (ZOFRAN) 4 MG tablet TAKE 1 TABLET BY MOUTH EVERY 8 HOURS AS NEEDED FOR NAUSEA AND VOMITING (Patient taking differently: Take 4 mg by mouth every 8 (eight) hours as needed for vomiting or nausea.) 30 tablet 0   oxyCODONE (ROXICODONE) 5 MG immediate release tablet 1 tab PO q6 hours prn pain (Patient not taking: Reported on 03/03/2021) 20 tablet 0   Oxycodone HCl 10 MG TABS TAKE 1 TABLET (10 MG TOTAL) BY MOUTH THREE TIMES DAILY AS NEEDED PAIN. (Patient  taking differently: Take 10 mg by mouth 3 (three) times daily as needed (pain).) 10 tablet 0   pantoprazole (PROTONIX) 40 MG tablet Take 40 mg by mouth daily.     polyethylene glycol (MIRALAX / GLYCOLAX) 17 g packet Take 17 g by mouth daily as needed (constipation).     sevelamer carbonate (RENVELA) 800 MG tablet Take 800 mg by mouth 3 (three) times daily with meals.       ALLERGIES Tramadol, Grass extracts [gramineae pollens], Morphine and related, Pollen extract, Acetaminophen, Aspirin, and Clonidine derivatives  MEDICAL HISTORY Past Medical History:  Diagnosis Date   Anemia    Anxiety    Arthritis    left shoulder   Arthritis    Atherosclerosis of aorta (HCC)    Cardiomegaly    Chest pain    DATE UNKNOWN, C/O PERIODICALLY   Chest pain    Cocaine abuse (HCC)    COPD (chronic obstructive pulmonary disease) (HCC)    COPD exacerbation ([place]) 08/17/2016   Coronary artery disease  stent 02/22/17   Coronary artery disease    Dysrhythmia    ESRD (end stage renal disease) ([place])    ESRD (end stage renal disease) on dialysis ([place])    "[name]; M-W-F" (07/04/2017)   GERD (gastroesophageal reflux disease)    DATE UNKNOWN   GERD (gastroesophageal reflux disease)    Hemorrhoids    Hepatitis B    Hepatitis B, chronic (HCC)    Hepatitis C    History of kidney stones    Hyperkalemia    Hypertension    Kidney failure    Metabolic bone disease    Patient denies   Mitral stenosis    Myocardial infarction [facility])    Pneumonia    Pulmonary edema    Renal disorder    Solitary rectal ulcer syndrome 07/2017   at [facility] for rectal bleeding   Solitary rectal ulcer syndrome    Tubular adenoma of colon      SOCIAL HISTORY Social History   Socioeconomic History   Marital status: Single    Spouse name: Not on file   Number of children: 3   Years of education: 10   Highest education level: Not on file  Occupational History   Occupation: Unemployed  Tobacco Use   Smoking  status: Every Day    Packs/day: 0.50    Years: 43.00    Pack years: 21.50    Types: Cigarettes    Start date: 08/13/1973   Smokeless tobacco: Never  Vaping Use   Vaping Use: Never used  Substance and Sexual Activity   Alcohol use: Not Currently    Comment: quit drinking in 2017   Drug use: Yes    Types: Marijuana, Cocaine    Comment:  Quit Cocaine 2020, Last use Marijuana 11/08/20   Sexual activity: Not Currently    Birth control/protection: None  Other Topics Concern   Not on file  Social History Narrative   ** Merged History Encounter **       Lives alone Caffeine use: Coffee-rare Soda- daily  [facility] (03/10/17): Originally from [facility]. Previously worked trimming trees. No pets currently. No bird or mold exposure.     Social Determinants of Health   Financial Resource Strain: Not on file  Food Insecurity: Not on file  Transportation Needs: Not on file  Physical Activity: Not on file  Stress: Not on file  Social Connections: Not on file  Intimate Partner Violence: Not on file     FAMILY HISTORY Family History  Problem Relation Age of Onset   Heart disease Mother    Lung cancer Mother    Heart disease Father    Malignant hyperthermia Father    COPD Father    Throat cancer Sister    Esophageal cancer Sister    Hypertension Other    COPD Other    Throat cancer Sister    Colon cancer Neg Hx    Colon polyps Neg Hx    Rectal cancer Neg Hx    Stomach cancer Neg Hx      Review of Systems: Unable to obtain due to the patient's altered mental status  Physical Exam: Vitals:   03/03/21 0530 03/03/21 0600  BP: (!) 135/119 107/64  Pulse: (!) 101 97  Resp: 13 16  Temp:    SpO2: 90% 91%   No intake/output data recorded. No intake or output data in the 24 hours ending 03/03/21 0702 General: sitting in bed with no apparent distress HEENT: anicteric sclera, MMM  CV: normal rate, no murmurs, trace edema in the bilateral ankles Lungs: bilateral chest rise,  normal wob Abd: soft, non-tender, mild distention Skin: no visible lesions or rashes Psych: awake and alert, angry mood, guarded affect Neuro: normal speech, oriented to person and place but not time or situation  Test Results Reviewed Lab Results  Component Value Date   NA 126 (L) 03/02/2021   K 6.5 (HH) 03/02/2021   CL 91 (L) 03/02/2021   CO2 23 03/02/2021   BUN 47 (H) 03/02/2021   CREATININE 12.52 (H) 03/03/2021   CALCIUM 8.3 (L) 03/02/2021   ALBUMIN 2.1 (L) 03/02/2021   PHOS 3.2 09/10/2020    I have reviewed relevant outside healthcare records

## 2021-03-03 NOTE — ED Notes (Signed)
Pt refusing all care and wanting to sign out AMA. Tawanna Solo, MD made aware.

## 2021-03-03 NOTE — ED Notes (Signed)
Pt refused blood draw at this time.

## 2021-03-03 NOTE — ED Notes (Signed)
Pt refusing cbg

## 2021-03-03 NOTE — H&P (Signed)
History and Physical    [name] OXB:[id number] DOB: 04-29-1961 DOA: 03/02/2021  PCP: Charlott Rakes, MD  Patient coming from: Home.  Chief Complaint: Confusion.  HPI: [name] is a 60 y.o. male with history of ESRD on hemodialysis Monday Wednesday and Friday, cirrhosis of liver secondary to alcohol hepatitis B and C, COPD with cor pulmonale, A. fib, anemia, polysubstance abuse was brought to the ER after patient was found to be confused.  EMS was called by patient's neighbor after patient was found to be confused and lethargic.  Patient states he was doing fine yesterday.  Denies any abdominal pain chest pain nausea vomiting or diarrhea.  EMS found his blood sugars were low was given D10 and was brought to the ER.  ED Course: In the ER patient CT head is unremarkable.  Blood sugar with in the 60s at times dropped to 30s but started on D10 W IV fluids.  X-ray shows some congestion.  Labs are remarkable for potassium was 6.7 EKG showing peaked T waves.  Patient did say he missed his dialysis today.  Hemoglobin 10.3 patient afebrile.  Nephrology on-call was consulted.  Patient was given Lokelma calcium gluconate for the hyperkalemia.  COVID test is pending.  Patient admitted for acute encephalopathy likely from hypoglycemia and also hyperkalemia from missed dialysis.  Review of Systems: As per HPI, rest all negative.   Past Medical History:  Diagnosis Date   Anemia    Anxiety    Arthritis    left shoulder   Arthritis    Atherosclerosis of aorta (HCC)    Cardiomegaly    Chest pain    DATE UNKNOWN, C/O PERIODICALLY   Chest pain    Cocaine abuse (HCC)    COPD (chronic obstructive pulmonary disease) (HCC)    COPD exacerbation ([place]) 08/17/2016   Coronary artery disease    stent 02/22/17   Coronary artery disease    Dysrhythmia    ESRD (end stage renal disease) ([place])    ESRD (end stage renal disease) on dialysis ([place])    "[name]; M-W-F" (07/04/2017)    GERD (gastroesophageal reflux disease)    DATE UNKNOWN   GERD (gastroesophageal reflux disease)    Hemorrhoids    Hepatitis B    Hepatitis B, chronic (HCC)    Hepatitis C    History of kidney stones    Hyperkalemia    Hypertension    Kidney failure    Metabolic bone disease    Patient denies   Mitral stenosis    Myocardial infarction [facility])    Pneumonia    Pulmonary edema    Renal disorder    Solitary rectal ulcer syndrome 07/2017   at flex sig for rectal bleeding   Solitary rectal ulcer syndrome    Tubular adenoma of colon     Past Surgical History:  Procedure Laterality Date   A/V FISTULAGRAM Left 05/26/2017   Procedure: A/V FISTULAGRAM;  Surgeon: Conrad Sandy Level, MD;  Location: [place] [facility];  Service: Cardiovascular;  Laterality: Left;   A/V FISTULAGRAM Right 11/18/2017   Procedure: A/V FISTULAGRAM - Right Arm;  Surgeon: Elam Dutch, MD;  Location: [place] [facility];  Service: Cardiovascular;  Laterality: Right;   AMPUTATION Right 09/04/2020   Procedure: RIGHT INDEX AND RIGHT RING FINGER AMPUTATION DIGIT;  Surgeon: Leanora Cover, MD;  Location: [place];  Service: Orthopedics;  Laterality: Right;   AMPUTATION Right 11/13/2020   Procedure: RIGHT INDEX FINGER AMPUTATION AND  RIGHT RING FINGER AMPUTATION;  Surgeon: Leanora Cover, MD;  Location: [place];  Service: Orthopedics;  Laterality: Right;   APPLICATION OF WOUND VAC Left 06/14/2017   Procedure: APPLICATION OF WOUND VAC;  Surgeon: Katha Cabal, MD;  Location: [facility];  Service: Vascular;  Laterality: Left;   AV FISTULA PLACEMENT  2012   BELIEVED WAS PLACED IN JUNE   AV FISTULA PLACEMENT Right 08/09/2017   Procedure: Creation Right arm ARTERIOVENOUS BRACHIOCEPOHALIC FISTULA;  Surgeon: Elam Dutch, MD;  [facility];  Service: Vascular;  Laterality: Right;   AV FISTULA PLACEMENT Right 11/22/2017   Procedure: INSERTION OF ARTERIOVENOUS (AV) GORE-TEX GRAFT RIGHT UPPER ARM;  Surgeon: Elam Dutch, MD;   Location: [place];  Service: Vascular;  Laterality: Right;   BIOPSY  01/25/2018   Procedure: BIOPSY;  Surgeon: Jerene Bears, MD;  Location: [place];  Service: Gastroenterology;;   BIOPSY  04/10/2019   Procedure: BIOPSY;  Surgeon: Jerene Bears, MD;  Location: [facility];  Service: Gastroenterology;;   COLONOSCOPY     COLONOSCOPY WITH PROPOFOL N/A 01/25/2018   Procedure: COLONOSCOPY WITH PROPOFOL;  Surgeon: Jerene Bears, MD;  Location: [place];  Service: Gastroenterology;  Laterality: N/A;   CORONARY STENT INTERVENTION N/A 02/22/2017   Procedure: CORONARY STENT INTERVENTION;  Surgeon: Nigel Mormon, MD;  Location: [place] [facility];  Service: Cardiovascular;  Laterality: N/A;   ESOPHAGOGASTRODUODENOSCOPY (EGD) WITH PROPOFOL N/A 01/25/2018   Procedure: ESOPHAGOGASTRODUODENOSCOPY (EGD) WITH PROPOFOL;  Surgeon: Jerene Bears, MD;  Location: [place];  Service: Gastroenterology;  Laterality: N/A;   ESOPHAGOGASTRODUODENOSCOPY (EGD) WITH PROPOFOL N/A 04/10/2019   Procedure: ESOPHAGOGASTRODUODENOSCOPY (EGD) WITH PROPOFOL;  Surgeon: Jerene Bears, MD;  Location: [facility];  Service: Gastroenterology;  Laterality: N/A;   FLEXIBLE SIGMOIDOSCOPY N/A 07/15/2017   Procedure: FLEXIBLE SIGMOIDOSCOPY;  Surgeon: Carol Ada, MD;  Location: [place];  Service: Endoscopy;  Laterality: N/A;   HEMORRHOID BANDING     I & D EXTREMITY Left 06/01/2017   Procedure: IRRIGATION AND DEBRIDEMENT LEFT ARM HEMATOMA WITH LIGATION OF LEFT ARM AV FISTULA;  Surgeon: Elam Dutch, MD;  Location: [place];  Service: Vascular;  Laterality: Left;   I & D EXTREMITY Left 06/14/2017   Procedure: IRRIGATION AND DEBRIDEMENT EXTREMITY;  Surgeon: Katha Cabal, MD;  Location: [facility];  Service: Vascular;  Laterality: Left;   INSERTION OF DIALYSIS CATHETER  05/30/2017   INSERTION OF DIALYSIS CATHETER N/A 05/30/2017   Procedure: INSERTION OF DIALYSIS CATHETER;  Surgeon: Elam Dutch, MD;  Location: [place];   Service: Vascular;  Laterality: N/A;   IR PARACENTESIS  08/30/2017   IR PARACENTESIS  09/29/2017   IR PARACENTESIS  10/28/2017   IR PARACENTESIS  11/09/2017   IR PARACENTESIS  11/16/2017   IR PARACENTESIS  11/28/2017   IR PARACENTESIS  12/01/2017   IR PARACENTESIS  12/06/2017   IR PARACENTESIS  01/03/2018   IR PARACENTESIS  01/23/2018   IR PARACENTESIS  02/07/2018   IR PARACENTESIS  02/21/2018   IR PARACENTESIS  03/06/2018   IR PARACENTESIS  03/17/2018   IR PARACENTESIS  04/04/2018   IR PARACENTESIS  12/28/2018   IR PARACENTESIS  01/08/2019   IR PARACENTESIS  01/23/2019   IR PARACENTESIS  02/01/2019   IR PARACENTESIS  02/19/2019   IR PARACENTESIS  03/01/2019   IR PARACENTESIS  03/15/2019   IR PARACENTESIS  04/03/2019   IR PARACENTESIS  04/12/2019   IR PARACENTESIS  05/01/2019   IR PARACENTESIS  05/08/2019   IR PARACENTESIS  05/24/2019   IR PARACENTESIS  06/12/2019   IR PARACENTESIS  07/09/2019   IR PARACENTESIS  07/27/2019   IR PARACENTESIS  08/09/2019   IR PARACENTESIS  08/21/2019   IR PARACENTESIS  09/17/2019   IR PARACENTESIS  10/05/2019   IR PARACENTESIS  10/29/2019   IR PARACENTESIS  11/08/2019   IR PARACENTESIS  12/12/2019   IR PARACENTESIS  01/03/2020   IR PARACENTESIS  01/10/2020   IR PARACENTESIS  01/17/2020   IR PARACENTESIS  01/24/2020   IR PARACENTESIS  01/31/2020   IR PARACENTESIS  02/07/2020   IR PARACENTESIS  02/21/2020   IR PARACENTESIS  05/21/2020   IR PARACENTESIS  10/10/2020   IR PARACENTESIS  10/28/2020   IR PARACENTESIS  11/18/2020   IR PARACENTESIS  12/02/2020   IR PARACENTESIS  12/11/2020   IR PARACENTESIS  12/18/2020   IR PARACENTESIS  12/30/2020   IR PARACENTESIS  01/06/2021   IR PARACENTESIS  01/13/2021   IR PARACENTESIS  01/20/2021   IR PARACENTESIS  01/27/2021   IR PARACENTESIS  02/03/2021   IR PARACENTESIS  02/10/2021   IR PARACENTESIS  02/17/2021   IR PARACENTESIS  02/24/2021   IR RADIOLOGIST EVAL & MGMT  02/14/2018   IR RADIOLOGIST EVAL & MGMT  02/22/2019   LEFT HEART CATH AND CORONARY  ANGIOGRAPHY N/A 02/22/2017   Procedure: LEFT HEART CATH AND CORONARY ANGIOGRAPHY;  Surgeon: Nigel Mormon, MD;  Location: [place] [facility];  Service: Cardiovascular;  Laterality: N/A;   LIGATION OF ARTERIOVENOUS  FISTULA Left 09/15/4678   Procedure: Plication of Left Arm Arteriovenous Fistula;  Surgeon: Elam Dutch, MD;  Location: [facility] OR;  Service: Vascular;  Laterality: Left;   POLYPECTOMY     POLYPECTOMY  01/25/2018   Procedure: POLYPECTOMY;  Surgeon: Jerene Bears, MD;  Location: [place];  Service: Gastroenterology;;   REVISON OF ARTERIOVENOUS FISTULA Left 09/30/2246   Procedure: PLICATION OF DISTAL ANEURYSMAL SEGEMENT OF LEFT UPPER ARM ARTERIOVENOUS FISTULA;  Surgeon: Elam Dutch, MD;  Location: [place];  Service: Vascular;  Laterality: Left;   REVISON OF ARTERIOVENOUS FISTULA Left 2/50/0370   Procedure: Plication of Left Upper Arm Fistula ;  Surgeon: Waynetta Sandy, MD;  Location: [place];  Service: Vascular;  Laterality: Left;   SKIN GRAFT SPLIT THICKNESS LEG / FOOT Left    SKIN GRAFT SPLIT THICKNESS LEFT ARM DONOR SITE: LEFT ANTERIOR THIGH   SKIN SPLIT GRAFT Left 07/04/2017   Procedure: SKIN GRAFT SPLIT THICKNESS LEFT ARM DONOR SITE: LEFT ANTERIOR THIGH;  Surgeon: Elam Dutch, MD;  Location: [place];  Service: Vascular;  Laterality: Left;   THROMBECTOMY W/ EMBOLECTOMY Left 06/05/2017   Procedure: EXPLORATION OF LEFT ARM FOR BLEEDING; OVERSEWED PROXIMAL FISTULA;  Surgeon: Angelia Mould, MD;  Location: [place];  Service: Vascular;  Laterality: Left;   WOUND EXPLORATION Left 06/03/2017   Procedure: WOUND EXPLORATION WITH WOUND VAC APPLICATION TO LEFT ARM;  Surgeon: Angelia Mould, MD;  Location: [place];  Service: Vascular;  Laterality: Left;     reports that he has been smoking cigarettes. He started smoking about 47 years ago. He has a 21.50 pack-year smoking history. He has never used smokeless tobacco. He reports that he does not currently use  alcohol. He reports current drug use. Drugs: Marijuana and Cocaine.  Allergies  Allergen Reactions   Tramadol Itching and Other (See Comments)    Other reaction(s): Unknown   Grass Extracts [Gramineae Pollens]  Other reaction(s): Sneezing   Morphine And Related Other (See Comments)    Stomach pain   Pollen Extract Other (See Comments)    Other reaction(s): Sneezing (finding)   Acetaminophen Nausea Only    Stomach ache    Aspirin Itching and Other (See Comments)    STOMACH PAIN  Other reaction(s): Unknown   Clonidine Derivatives Itching    Family History  Problem Relation Age of Onset   Heart disease Mother    Lung cancer Mother    Heart disease Father    Malignant hyperthermia Father    COPD Father    Throat cancer Sister    Esophageal cancer Sister    Hypertension Other    COPD Other    Throat cancer Sister    Colon cancer Neg Hx    Colon polyps Neg Hx    Rectal cancer Neg Hx    Stomach cancer Neg Hx     Prior to Admission medications   Medication Sig Start Date End Date Taking? Authorizing Provider  acetaminophen (TYLENOL) 500 MG tablet Take 500-1,000 mg by mouth every 6 (six) hours as needed (pain.).    [provider]  albuterol (VENTOLIN HFA) 108 (90 Base) MCG/ACT inhaler INHALE 2 PUFFS BY MOUTH EVERY 4 HOURS AS NEEDED FOR WHEEZE OR FOR SHORTNESS OF BREATH Patient taking differently: Inhale 2 puffs into the lungs every 4 (four) hours as needed for wheezing or shortness of breath. 02/06/21   Cantwell, Celeste C, PA-C  amiodarone (PACERONE) 200 MG tablet TAKE 1 TABLET BY MOUTH EVERY DAY Patient taking differently: Take 200 mg by mouth in the morning. 09/29/20   Cantwell, Celeste C, PA-C  cetirizine (ZYRTEC) 10 MG tablet Take 10 mg by mouth daily as needed for allergies. 06/23/20   [provider]  cinacalcet (SENSIPAR) 30 MG tablet TAKE 2 TABLETS (60 MG TOTAL) BY MOUTH EVERY MONDAY, WEDNESDAY, AND FRIDAY WITH HEMODIALYSIS. Patient taking  differently: Take 60 mg by mouth See admin instructions. Monday,Wednesday and friday 09/12/20 09/12/21  Florencia Reasons, MD  diltiazem (CARDIZEM CD) 120 MG 24 hr capsule TAKE 1 CAPSULE BY MOUTH EVERY DAY Patient taking differently: Take 120 mg by mouth daily. 12/15/20   Adrian Prows, MD  doxycycline (VIBRAMYCIN) 100 MG capsule Take 100 mg by mouth 2 (two) times daily. 11/05/20   [provider]  gabapentin (NEURONTIN) 300 MG capsule TAKE 1 CAPSULE BY MOUTH IN THE MORNING AND AT BEDTIME Patient taking differently: Take 300 mg by mouth 2 (two) times daily. 11/14/20   Charlott Rakes, MD  lactulose (CHRONULAC) 10 GM/15ML solution Take 30 mLs (20 g total) by mouth daily. Patient taking differently: Take 20 g by mouth daily as needed (constipation). 09/12/20   Florencia Reasons, MD  lidocaine-prilocaine (EMLA) cream Apply 1 application topically every Monday, Wednesday, and Friday with hemodialysis. Applied sparingly for port access 11/01/20   [provider]  metoprolol succinate (TOPROL-XL) 25 MG 24 hr tablet Take 0.5 tablets (12.5 mg total) by mouth daily. 01/29/21   Cantwell, Celeste C, PA-C  ondansetron (ZOFRAN) 4 MG tablet TAKE 1 TABLET BY MOUTH EVERY 8 HOURS AS NEEDED FOR NAUSEA AND VOMITING Patient taking differently: Take 4 mg by mouth every 8 (eight) hours as needed for vomiting or nausea. 12/15/20   Charlott Rakes, MD  oxyCODONE (ROXICODONE) 5 MG immediate release tablet 1 tab PO q6 hours prn pain Patient taking differently: Take 5 mg by mouth every 6 (six) hours as needed for moderate pain. 11/13/20  Leanora Cover, MD  Oxycodone HCl 10 MG TABS TAKE 1 TABLET (10 MG TOTAL) BY MOUTH THREE TIMES DAILY AS NEEDED PAIN. Patient taking differently: Take 10 mg by mouth 3 (three) times daily as needed (pain). 09/12/20 03/11/21  Florencia Reasons, MD  pantoprazole (PROTONIX) 40 MG tablet Take 40 mg by mouth daily.    [provider]  polyethylene glycol (MIRALAX / GLYCOLAX) 17 g packet Take 17 g by mouth daily as needed  (constipation).    [provider]  sevelamer carbonate (RENVELA) 800 MG tablet Take 800 mg by mouth 3 (three) times daily with meals.    [provider]    Physical Exam: Constitutional: Moderately built and nourished. Vitals:   03/02/21 2330 03/03/21 0000 03/03/21 0030 03/03/21 0100  BP: (!) 82/57 103/61 101/67 (!) 87/66  Pulse: 87 88 96 96  Resp: 15 19 19 16   Temp:      TempSrc:      SpO2: 92% 93% 91% 94%  Weight:      Height:       Eyes: Anicteric no pallor. ENMT: No discharge from the ears eyes nose and mouth. Neck: No mass felt.  No neck rigidity. Respiratory: No rhonchi or crepitations. Cardiovascular: S1-S2 heard. Abdomen: Soft nontender bowel sound present. Musculoskeletal: No edema. Skin: No rash. Neurologic: Patient alert awake he is oriented to the place and person but still not completely back to baseline moving all extremities. Psychiatric: Oriented to place and person.   Labs on Admission: I have personally reviewed following labs and imaging studies  CBC: Recent Labs  Lab 03/02/21 1948 03/02/21 2007  WBC 7.5  --   NEUTROABS 5.8  --   HGB 10.3* 9.9*  HCT 29.3* 29.0*  MCV 78.6*  --   PLT 187  --    Basic Metabolic Panel: Recent Labs  Lab 03/02/21 1948 03/02/21 2007  NA 128* 126*  K 6.7* 6.5*  CL 88* 91*  CO2 23  --   GLUCOSE 59* 52*  BUN 54* 47*  CREATININE 12.50* 12.40*  CALCIUM 8.3*  --    GFR: Estimated Creatinine Clearance: 6.3 mL/min (A) (by C-G formula based on SCr of 12.4 mg/dL (H)). Liver Function Tests: Recent Labs  Lab 03/02/21 1948  AST 52*  ALT 17  ALKPHOS 183*  BILITOT 1.1  PROT 5.8*  ALBUMIN 2.1*   No results for input(s): LIPASE, AMYLASE in the last 168 hours. No results for input(s): AMMONIA in the last 168 hours. Coagulation Profile: No results for input(s): INR, PROTIME in the last 168 hours. Cardiac Enzymes: No results for input(s): CKTOTAL, CKMB, CKMBINDEX, TROPONINI in the last 168  hours. BNP (last 3 results) No results for input(s): PROBNP in the last 8760 hours. HbA1C: No results for input(s): HGBA1C in the last 72 hours. CBG: Recent Labs  Lab 03/02/21 1942 03/02/21 2056 03/02/21 2324 03/03/21 0046  GLUCAP 69* 101* 36* 85   Lipid Profile: No results for input(s): CHOL, HDL, LDLCALC, TRIG, CHOLHDL, LDLDIRECT in the last 72 hours. Thyroid Function Tests: No results for input(s): TSH, T4TOTAL, FREET4, T3FREE, THYROIDAB in the last 72 hours. Anemia Panel: No results for input(s): VITAMINB12, FOLATE, FERRITIN, TIBC, IRON, RETICCTPCT in the last 72 hours. Urine analysis:    Component Value Date/Time   COLORURINE YELLOW 07/16/2011 1619   APPEARANCEUR CLEAR 07/16/2011 1619   LABSPEC 1.018 07/16/2011 1619   PHURINE 7.5 07/16/2011 1619   GLUCOSEU 250 (A) 07/16/2011 1619   HGBUR MODERATE (A) 07/16/2011 1619  BILIRUBINUR SMALL (A) 07/16/2011 1619   KETONESUR NEGATIVE 07/16/2011 1619   PROTEINUR >300 (A) 07/16/2011 1619   UROBILINOGEN 1.0 07/16/2011 1619   NITRITE NEGATIVE 07/16/2011 1619   LEUKOCYTESUR SMALL (A) 07/16/2011 1619   Sepsis Labs: @LABRCNTIP (procalcitonin:4,lacticidven:4) )No results found for this or any previous visit (from the past 240 hour(s)).   Radiological Exams on Admission: CT HEAD WO CONTRAST (5MM)  Result Date: 03/02/2021 CLINICAL DATA:  Altered level of consciousness, hypoglycemia EXAM: CT HEAD WITHOUT CONTRAST TECHNIQUE: Contiguous axial images were obtained from the base of the skull through the vertex without intravenous contrast. COMPARISON:  01/11/2017 FINDINGS: Brain: Chronic hypodensities are seen bilaterally within the periventricular white matter and basal ganglia consistent with chronic small vessel ischemic changes. No signs of acute infarct or hemorrhage. The lateral ventricles and remaining midline structures are unremarkable. No acute extra-axial fluid collections. No mass effect. Vascular: Extensive atherosclerosis  unchanged. No hyperdense vessel. Skull: Normal. Negative for fracture or focal lesion. Sinuses/Orbits: No acute finding. Other: None. IMPRESSION: 1. Chronic small vessel ischemic change as above. No acute intracranial process. Electronically Signed   By: Randa Ngo M.D.   On: 03/02/2021 23:03   DG Chest Portable 1 View  Result Date: 03/02/2021 CLINICAL DATA:  Altered mental status EXAM: PORTABLE CHEST 1 VIEW COMPARISON:  Chest radiograph 09/20/2020 FINDINGS: The heart is markedly enlarged the mediastinal contours are grossly within normal limits. There is calcified atherosclerotic plaque of the vasculature. There is vascular congestion without definite frank interstitial edema. Patchy opacities in the lung bases likely reflect subsegmental atelectasis. There is a trace right effusion. There is no definite left effusion. There is no appreciable pneumothorax. There is no acute osseous abnormality. IMPRESSION: 1. Bibasilar subsegmental atelectasis and vascular congestion but no definite frank interstitial edema. 2. Trace right pleural effusion. 3. Marked cardiomegaly. Electronically Signed   By: Valetta Mole M.D.   On: 03/02/2021 20:25    EKG: Independently reviewed.  A. fib rate controlled with peaked T waves.  Assessment/Plan Principal Problem:   Acute encephalopathy Active Problems:   Essential hypertension   Cirrhosis of liver with ascites (HCC)   ESRD (end stage renal disease) (HCC)   Hypoglycemia    Acute encephalopathy likely secondary to hypoglycemia for which patient is on D10W.  Because of hypoglycemia is not clear.  We will check C-peptide levels and cortisol levels.  Closely check CBGs every hour for now.  Patient states he has not been eating well.  In addition given the history of cirrhosis of the liver we will check ammonia levels. ESRD on hemodialysis missed dialysis today and has hyperkalemia.  Nephrology has been consulted.  Patient was given Lokelma and calcium gluconate.   Closely follow metabolic panel. History of A. fib takes amiodarone and metoprolol.  Patient's blood pressure was low at admission.  Was given fluid bolus.  We will need to confirm patient's home medication doses.  Not on anticoagulation given history of cirrhosis of the liver and noncompliance. COPD with cor pulmonale presently not wheezing. Anemia likely from ESRD follow CBC. History of ischemic fingers of the hand status post amputation. History of polysubstance use patient states that he does not use any cocaine or alcohol anymore.  Keep patient on thiamine.  COVID test pending.   DVT prophylaxis: Heparin. Code Status: Full code. Family Communication: Will need to discuss with family. Disposition Plan: Home when stable. Consults called: Nephrology. Admission status: Observation.   Rise Patience MD [facility] Pager [phone].  If  7PM-7AM, please contact night-coverage [email or web address] Password [email or web address]  03/03/2021, 1:15 AM

## 2021-03-03 NOTE — ED Notes (Signed)
Pt now requesting again to leave [facility]a and has a ride coming to get him. Pt explained the risks of leaving [facility] and pt is understanding Pt wheeled out to lobby to wait for ride

## 2021-03-03 NOTE — ED Notes (Signed)
Pt refusing vitals.

## 2021-03-04 ENCOUNTER — Other Ambulatory Visit: Payer: Self-pay

## 2021-03-04 ENCOUNTER — Inpatient Hospital Stay (HOSPITAL_COMMUNITY)
Admission: EM | Admit: 2021-03-04 | Discharge: 2021-03-07 | DRG: 640 | Disposition: A | Discharge status: Home / Self Care | Payer: Medicare Other | Attending: Internal Medicine | Admitting: Internal Medicine

## 2021-03-04 ENCOUNTER — Encounter (HOSPITAL_COMMUNITY): Payer: Self-pay | Admitting: Emergency Medicine

## 2021-03-04 DIAGNOSIS — R0902 Hypoxemia: Secondary | ICD-10-CM | POA: Diagnosis present

## 2021-03-04 DIAGNOSIS — E8770 Fluid overload, unspecified: Secondary | ICD-10-CM | POA: Diagnosis present

## 2021-03-04 DIAGNOSIS — B1920 Unspecified viral hepatitis C without hepatic coma: Secondary | ICD-10-CM | POA: Diagnosis present

## 2021-03-04 DIAGNOSIS — Z89021 Acquired absence of right finger(s): Secondary | ICD-10-CM

## 2021-03-04 DIAGNOSIS — I2781 Cor pulmonale (chronic): Secondary | ICD-10-CM | POA: Diagnosis present

## 2021-03-04 DIAGNOSIS — H5703 Miosis: Secondary | ICD-10-CM | POA: Diagnosis not present

## 2021-03-04 DIAGNOSIS — R188 Other ascites: Secondary | ICD-10-CM | POA: Diagnosis present

## 2021-03-04 DIAGNOSIS — G9341 Metabolic encephalopathy: Secondary | ICD-10-CM | POA: Diagnosis present

## 2021-03-04 DIAGNOSIS — I252 Old myocardial infarction: Secondary | ICD-10-CM

## 2021-03-04 DIAGNOSIS — I480 Paroxysmal atrial fibrillation: Secondary | ICD-10-CM | POA: Diagnosis present

## 2021-03-04 DIAGNOSIS — M898X9 Other specified disorders of bone, unspecified site: Secondary | ICD-10-CM | POA: Diagnosis present

## 2021-03-04 DIAGNOSIS — Z79899 Other long term (current) drug therapy: Secondary | ICD-10-CM

## 2021-03-04 DIAGNOSIS — I2510 Atherosclerotic heart disease of native coronary artery without angina pectoris: Secondary | ICD-10-CM | POA: Diagnosis present

## 2021-03-04 DIAGNOSIS — K7460 Unspecified cirrhosis of liver: Secondary | ICD-10-CM | POA: Diagnosis present

## 2021-03-04 DIAGNOSIS — E871 Hypo-osmolality and hyponatremia: Secondary | ICD-10-CM | POA: Diagnosis present

## 2021-03-04 DIAGNOSIS — Z888 Allergy status to other drugs, medicaments and biological substances status: Secondary | ICD-10-CM

## 2021-03-04 DIAGNOSIS — I10 Essential (primary) hypertension: Secondary | ICD-10-CM | POA: Diagnosis present

## 2021-03-04 DIAGNOSIS — Z825 Family history of asthma and other chronic lower respiratory diseases: Secondary | ICD-10-CM

## 2021-03-04 DIAGNOSIS — K709 Alcoholic liver disease, unspecified: Secondary | ICD-10-CM | POA: Diagnosis present

## 2021-03-04 DIAGNOSIS — G8929 Other chronic pain: Secondary | ICD-10-CM | POA: Diagnosis present

## 2021-03-04 DIAGNOSIS — E162 Hypoglycemia, unspecified: Secondary | ICD-10-CM | POA: Diagnosis present

## 2021-03-04 DIAGNOSIS — J449 Chronic obstructive pulmonary disease, unspecified: Secondary | ICD-10-CM | POA: Diagnosis present

## 2021-03-04 DIAGNOSIS — G9340 Encephalopathy, unspecified: Secondary | ICD-10-CM | POA: Diagnosis not present

## 2021-03-04 DIAGNOSIS — F17210 Nicotine dependence, cigarettes, uncomplicated: Secondary | ICD-10-CM | POA: Diagnosis present

## 2021-03-04 DIAGNOSIS — Z9115 Patient's noncompliance with renal dialysis: Secondary | ICD-10-CM

## 2021-03-04 DIAGNOSIS — Z8249 Family history of ischemic heart disease and other diseases of the circulatory system: Secondary | ICD-10-CM

## 2021-03-04 DIAGNOSIS — I120 Hypertensive chronic kidney disease with stage 5 chronic kidney disease or end stage renal disease: Secondary | ICD-10-CM | POA: Diagnosis present

## 2021-03-04 DIAGNOSIS — B181 Chronic viral hepatitis B without delta-agent: Secondary | ICD-10-CM | POA: Diagnosis present

## 2021-03-04 DIAGNOSIS — E875 Hyperkalemia: Principal | ICD-10-CM | POA: Diagnosis present

## 2021-03-04 DIAGNOSIS — G546 Phantom limb syndrome with pain: Secondary | ICD-10-CM | POA: Diagnosis present

## 2021-03-04 DIAGNOSIS — D631 Anemia in chronic kidney disease: Secondary | ICD-10-CM | POA: Diagnosis present

## 2021-03-04 DIAGNOSIS — Z885 Allergy status to narcotic agent status: Secondary | ICD-10-CM

## 2021-03-04 DIAGNOSIS — K219 Gastro-esophageal reflux disease without esophagitis: Secondary | ICD-10-CM | POA: Diagnosis present

## 2021-03-04 DIAGNOSIS — Z955 Presence of coronary angioplasty implant and graft: Secondary | ICD-10-CM

## 2021-03-04 DIAGNOSIS — N186 End stage renal disease: Secondary | ICD-10-CM | POA: Diagnosis present

## 2021-03-04 DIAGNOSIS — Z79891 Long term (current) use of opiate analgesic: Secondary | ICD-10-CM

## 2021-03-04 DIAGNOSIS — I700 Atherosclerosis of aorta: Secondary | ICD-10-CM | POA: Diagnosis present

## 2021-03-04 DIAGNOSIS — Z20822 Contact with and (suspected) exposure to covid-19: Secondary | ICD-10-CM | POA: Diagnosis present

## 2021-03-04 DIAGNOSIS — Z992 Dependence on renal dialysis: Secondary | ICD-10-CM | POA: Diagnosis not present

## 2021-03-04 DIAGNOSIS — I959 Hypotension, unspecified: Secondary | ICD-10-CM | POA: Diagnosis present

## 2021-03-04 DIAGNOSIS — Z886 Allergy status to analgesic agent status: Secondary | ICD-10-CM

## 2021-03-04 DIAGNOSIS — R531 Weakness: Secondary | ICD-10-CM

## 2021-03-04 DIAGNOSIS — Z91048 Other nonmedicinal substance allergy status: Secondary | ICD-10-CM

## 2021-03-04 LAB — CBC
HCT: 28.7 % — ABNORMAL LOW (ref 39.0–52.0)
Hemoglobin: 10.2 g/dL — ABNORMAL LOW (ref 13.0–17.0)
MCH: 27.6 pg (ref 26.0–34.0)
MCHC: 35.5 g/dL (ref 30.0–36.0)
MCV: 77.6 fL — ABNORMAL LOW (ref 80.0–100.0)
Platelets: 168 10*3/uL (ref 150–400)
RBC: 3.7 MIL/uL — ABNORMAL LOW (ref 4.22–5.81)
RDW: 13.7 % (ref 11.5–15.5)
WBC: 10.3 10*3/uL (ref 4.0–10.5)
nRBC: 0 % (ref 0.0–0.2)

## 2021-03-04 LAB — CBC WITH DIFFERENTIAL/PLATELET
Abs Immature Granulocytes: 0.06 10*3/uL (ref 0.00–0.07)
Basophils Absolute: 0.1 10*3/uL (ref 0.0–0.1)
Basophils Relative: 1 %
Eosinophils Absolute: 0.1 10*3/uL (ref 0.0–0.5)
Eosinophils Relative: 1 %
HCT: 30.7 % — ABNORMAL LOW (ref 39.0–52.0)
Hemoglobin: 10.5 g/dL — ABNORMAL LOW (ref 13.0–17.0)
Immature Granulocytes: 1 %
Lymphocytes Relative: 9 %
Lymphs Abs: 1.1 10*3/uL (ref 0.7–4.0)
MCH: 27.3 pg (ref 26.0–34.0)
MCHC: 34.2 g/dL (ref 30.0–36.0)
MCV: 79.9 fL — ABNORMAL LOW (ref 80.0–100.0)
Monocytes Absolute: 0.9 10*3/uL (ref 0.1–1.0)
Monocytes Relative: 7 %
Neutro Abs: 9.9 10*3/uL — ABNORMAL HIGH (ref 1.7–7.7)
Neutrophils Relative %: 81 %
Platelets: 173 10*3/uL (ref 150–400)
RBC: 3.84 MIL/uL — ABNORMAL LOW (ref 4.22–5.81)
RDW: 13.9 % (ref 11.5–15.5)
WBC: 12.2 10*3/uL — ABNORMAL HIGH (ref 4.0–10.5)
nRBC: 0 % (ref 0.0–0.2)

## 2021-03-04 LAB — BASIC METABOLIC PANEL
Anion gap: 14 (ref 5–15)
BUN: 61 mg/dL — ABNORMAL HIGH (ref 6–20)
CO2: 21 mmol/L — ABNORMAL LOW (ref 22–32)
Calcium: 7.5 mg/dL — ABNORMAL LOW (ref 8.9–10.3)
Chloride: 90 mmol/L — ABNORMAL LOW (ref 98–111)
Creatinine, Ser: 13.55 mg/dL — ABNORMAL HIGH (ref 0.61–1.24)
GFR, Estimated: 4 mL/min — ABNORMAL LOW (ref >60)
Glucose, Bld: 89 mg/dL (ref 70–99)
Potassium: 7.1 mmol/L (ref 3.5–5.1)
Sodium: 125 mmol/L — ABNORMAL LOW (ref 135–145)

## 2021-03-04 LAB — CBG MONITORING, ED: Glucose-Capillary: 87 mg/dL (ref 70–99)

## 2021-03-04 LAB — RESP PANEL BY RT-PCR (FLU A&B, COVID) ARPGX2
Influenza A by PCR: NEGATIVE
Influenza B by PCR: NEGATIVE
SARS Coronavirus 2 by RT PCR: NEGATIVE

## 2021-03-04 LAB — C-PEPTIDE: C-Peptide: 4.7 ng/mL — ABNORMAL HIGH (ref 1.1–4.4)

## 2021-03-04 LAB — CREATININE, SERUM
Creatinine, Ser: 13.92 mg/dL — ABNORMAL HIGH (ref 0.61–1.24)
GFR, Estimated: 4 mL/min — ABNORMAL LOW (ref >60)

## 2021-03-04 MED ORDER — ALBUTEROL SULFATE (2.5 MG/3ML) 0.083% IN NEBU
2.5000 mg | INHALATION_SOLUTION | RESPIRATORY_TRACT | Status: DC | PRN
Start: 2021-03-04 — End: 2021-03-04

## 2021-03-04 MED ORDER — ALBUTEROL SULFATE (2.5 MG/3ML) 0.083% IN NEBU
10.0000 mg | INHALATION_SOLUTION | Freq: Once | RESPIRATORY_TRACT | Status: AC
  Administered 2021-03-04: 10 mg via RESPIRATORY_TRACT
  Filled 2021-03-04: qty 12

## 2021-03-04 MED ORDER — SODIUM CHLORIDE 0.9% FLUSH
3.0000 mL | Freq: Two times a day (BID) | INTRAVENOUS | Status: DC
  Administered 2021-03-05: 3 mL via INTRAVENOUS

## 2021-03-04 MED ORDER — ACETAMINOPHEN 650 MG RE SUPP: 650.0000 mg | Freq: Four times a day (QID) | RECTAL | Status: DC | PRN

## 2021-03-04 MED ORDER — NICOTINE 21 MG/24HR TD PT24
21.0000 mg | MEDICATED_PATCH | Freq: Every day | TRANSDERMAL | Status: DC
  Administered 2021-03-04 – 2021-03-07 (×4): 21 mg via TRANSDERMAL
  Filled 2021-03-04 (×4): qty 1

## 2021-03-04 MED ORDER — FUROSEMIDE 10 MG/ML IJ SOLN
40.0000 mg | Freq: Once | INTRAMUSCULAR | Status: AC
  Administered 2021-03-04: 40 mg via INTRAVENOUS
  Filled 2021-03-04: qty 4

## 2021-03-04 MED ORDER — LACTULOSE 10 GM/15ML PO SOLN
20.0000 g | Freq: Every day | ORAL | Status: DC
  Administered 2021-03-04 – 2021-03-05 (×2): 20 g via ORAL
  Filled 2021-03-04 (×3): qty 30

## 2021-03-04 MED ORDER — DILTIAZEM HCL ER COATED BEADS 120 MG PO CP24
120.0000 mg | ORAL_CAPSULE | Freq: Every day | ORAL | Status: DC
  Administered 2021-03-05: 120 mg via ORAL
  Filled 2021-03-04 (×2): qty 1

## 2021-03-04 MED ORDER — SODIUM CHLORIDE 0.9 % IV BOLUS
250.0000 mL | Freq: Once | INTRAVENOUS | Status: AC
  Administered 2021-03-04: 250 mL via INTRAVENOUS

## 2021-03-04 MED ORDER — ACETAMINOPHEN 325 MG PO TABS: 650.0000 mg | ORAL_TABLET | Freq: Four times a day (QID) | ORAL | Status: DC | PRN

## 2021-03-04 MED ORDER — CALCIUM GLUCONATE 10 % IV SOLN
1.0000 g | Freq: Once | INTRAVENOUS | Status: AC
  Administered 2021-03-04: 1 g via INTRAVENOUS
  Filled 2021-03-04: qty 10

## 2021-03-04 MED ORDER — HYDRALAZINE HCL 10 MG PO TABS: 10.0000 mg | ORAL_TABLET | Freq: Three times a day (TID) | ORAL | Status: DC | PRN

## 2021-03-04 MED ORDER — CHLORHEXIDINE GLUCONATE CLOTH 2 % EX PADS
6.0000 | MEDICATED_PAD | Freq: Every day | CUTANEOUS | Status: DC
  Administered 2021-03-06: 6 via TOPICAL

## 2021-03-04 MED ORDER — ACETAMINOPHEN 500 MG PO TABS: 500.0000 mg | ORAL_TABLET | Freq: Four times a day (QID) | ORAL | Status: DC | PRN

## 2021-03-04 MED ORDER — DOCUSATE SODIUM 100 MG PO CAPS
100.0000 mg | ORAL_CAPSULE | Freq: Two times a day (BID) | ORAL | Status: DC
  Administered 2021-03-05 – 2021-03-06 (×3): 100 mg via ORAL
  Filled 2021-03-04 (×6): qty 1

## 2021-03-04 MED ORDER — SODIUM BICARBONATE 8.4 % IV SOLN
50.0000 meq | Freq: Once | INTRAVENOUS | Status: AC
Start: 2021-03-04 — End: 2021-03-04
  Administered 2021-03-04: 50 meq via INTRAVENOUS
  Filled 2021-03-04: qty 50

## 2021-03-04 MED ORDER — ALBUTEROL SULFATE HFA 108 (90 BASE) MCG/ACT IN AERS: 2.0000 | INHALATION_SPRAY | RESPIRATORY_TRACT | Status: DC | PRN

## 2021-03-04 MED ORDER — SODIUM CHLORIDE 0.9% FLUSH: 3.0000 mL | INTRAVENOUS | Status: DC | PRN

## 2021-03-04 MED ORDER — SODIUM CHLORIDE 0.9 % IV SOLN: 250.0000 mL | INTRAVENOUS | Status: DC | PRN

## 2021-03-04 MED ORDER — AMIODARONE HCL 200 MG PO TABS
200.0000 mg | ORAL_TABLET | Freq: Every day | ORAL | Status: DC
  Administered 2021-03-05 – 2021-03-07 (×3): 200 mg via ORAL
  Filled 2021-03-04 (×3): qty 1

## 2021-03-04 MED ORDER — METOPROLOL SUCCINATE ER 25 MG PO TB24
12.5000 mg | ORAL_TABLET | Freq: Every day | ORAL | Status: DC
  Administered 2021-03-05 – 2021-03-07 (×2): 12.5 mg via ORAL
  Filled 2021-03-04 (×3): qty 1

## 2021-03-04 MED ORDER — SODIUM ZIRCONIUM CYCLOSILICATE 10 G PO PACK
10.0000 g | PACK | Freq: Once | ORAL | Status: AC
  Administered 2021-03-04: 10 g via ORAL
  Filled 2021-03-04: qty 1

## 2021-03-04 MED ORDER — PANTOPRAZOLE SODIUM 40 MG PO TBEC
40.0000 mg | DELAYED_RELEASE_TABLET | Freq: Every day | ORAL | Status: DC
  Administered 2021-03-05 – 2021-03-07 (×3): 40 mg via ORAL
  Filled 2021-03-04 (×3): qty 1

## 2021-03-04 MED ORDER — SEVELAMER CARBONATE 800 MG PO TABS
800.0000 mg | ORAL_TABLET | Freq: Three times a day (TID) | ORAL | Status: DC
  Administered 2021-03-05 – 2021-03-07 (×5): 800 mg via ORAL
  Filled 2021-03-04 (×5): qty 1

## 2021-03-04 MED ORDER — HEPARIN SODIUM (PORCINE) 5000 UNIT/ML IJ SOLN
5000.0000 [IU] | Freq: Three times a day (TID) | INTRAMUSCULAR | Status: DC
  Administered 2021-03-05: 5000 [IU] via SUBCUTANEOUS
  Filled 2021-03-04 (×4): qty 1

## 2021-03-04 MED ORDER — POLYETHYLENE GLYCOL 3350 17 G PO PACK: 17.0000 g | PACK | Freq: Every day | ORAL | Status: DC | PRN

## 2021-03-04 MED ORDER — DEXTROSE 10 % IV SOLN
Freq: Once | INTRAVENOUS | Status: AC
Start: 2021-03-04 — End: 2021-03-04

## 2021-03-04 NOTE — Consult Note (Signed)
Renal Service Consult Note [facility]  [name] [facility] 03/04/2021 [name], MD Requesting Physician: Dr. Eulis Foster  Reason for Consult: ESRD pt w/ missed HD and high K+  HPI: The patient is a 60 y.o. year-old w/ hx of hep B, ESRD on HD, cirrhosis w/ recur ascites, COPD, atrial fib (no a/c d/t poor compliance), hx CAD prior stent presented to ED w/ c/o gen'd weakness, cannot ambulate normally but now feels even worse.  BP's 90's in ED. Got narcan in ED and improved some. K+ came back 7.0. EKG shows old LBBB. Getting temporizing measures in the ED for high K+.  Asked to see for dialysis.   Missed HD x 2, last HD was Wed last week.  No SOB or cough or fevers. Had neg COVID in eD here yesterday.     ROS - denies CP, no joint pain, no HA, no blurry vision, no rash, no diarrhea, no nausea/ vomiting, no dysuria, no difficulty voiding   Past Medical History  Past Medical History:  Diagnosis Date   Anemia    Anxiety    Arthritis    left shoulder   Arthritis    Atherosclerosis of aorta (HCC)    Cardiomegaly    Chest pain    DATE UNKNOWN, C/O PERIODICALLY   Chest pain    Cocaine abuse (HCC)    COPD (chronic obstructive pulmonary disease) (HCC)    COPD exacerbation ([place]) 08/17/2016   Coronary artery disease    stent 02/22/17   Coronary artery disease    Dysrhythmia    ESRD (end stage renal disease) ([place])    ESRD (end stage renal disease) on dialysis ([place])    "[name]; M-W-F" (07/04/2017)   GERD (gastroesophageal reflux disease)    DATE UNKNOWN   GERD (gastroesophageal reflux disease)    Hemorrhoids    Hepatitis B    Hepatitis B, chronic (HCC)    Hepatitis C    History of kidney stones    Hyperkalemia    Hypertension    Kidney failure    Metabolic bone disease    Patient denies   Mitral stenosis    Myocardial infarction [facility])    Pneumonia    Pulmonary edema    Renal disorder    Solitary rectal ulcer syndrome 07/2017   at flex sig for rectal  bleeding   Solitary rectal ulcer syndrome    Tubular adenoma of colon    Past Surgical History  Past Surgical History:  Procedure Laterality Date   A/V FISTULAGRAM Left 05/26/2017   Procedure: A/V FISTULAGRAM;  Surgeon: Conrad McKinney, MD;  Location: [place] [facility];  Service: Cardiovascular;  Laterality: Left;   A/V FISTULAGRAM Right 11/18/2017   Procedure: A/V FISTULAGRAM - Right Arm;  Surgeon: Elam Dutch, MD;  Location: [place] [facility];  Service: Cardiovascular;  Laterality: Right;   AMPUTATION Right 09/04/2020   Procedure: RIGHT INDEX AND RIGHT RING FINGER AMPUTATION DIGIT;  Surgeon: Leanora Cover, MD;  Location: [place];  Service: Orthopedics;  Laterality: Right;   AMPUTATION Right 11/13/2020   Procedure: RIGHT INDEX FINGER AMPUTATION AND RIGHT RING FINGER AMPUTATION;  Surgeon: Leanora Cover, MD;  Location: [place];  Service: Orthopedics;  Laterality: Right;   APPLICATION OF WOUND VAC Left 06/14/2017   Procedure: APPLICATION OF WOUND VAC;  Surgeon: Katha Cabal, MD;  Location: [facility];  Service: Vascular;  Laterality: Left;   AV FISTULA PLACEMENT  2012   BELIEVED WAS PLACED IN  JUNE   AV FISTULA PLACEMENT Right 08/09/2017   Procedure: Creation Right arm ARTERIOVENOUS BRACHIOCEPOHALIC FISTULA;  Surgeon: Elam Dutch, MD;  Location: [facility];  Service: Vascular;  Laterality: Right;   AV FISTULA PLACEMENT Right 11/22/2017   Procedure: INSERTION OF ARTERIOVENOUS (AV) GORE-TEX GRAFT RIGHT UPPER ARM;  Surgeon: Elam Dutch, MD;  Location: [place];  Service: Vascular;  Laterality: Right;   BIOPSY  01/25/2018   Procedure: BIOPSY;  Surgeon: Jerene Bears, MD;  Location: [place];  Service: Gastroenterology;;   BIOPSY  04/10/2019   Procedure: BIOPSY;  Surgeon: Jerene Bears, MD;  Location: [facility];  Service: Gastroenterology;;   COLONOSCOPY     COLONOSCOPY WITH PROPOFOL N/A 01/25/2018   Procedure: COLONOSCOPY WITH PROPOFOL;  Surgeon: Jerene Bears, MD;  Location: [place];   Service: Gastroenterology;  Laterality: N/A;   CORONARY STENT INTERVENTION N/A 02/22/2017   Procedure: CORONARY STENT INTERVENTION;  Surgeon: Nigel Mormon, MD;  Location: [place] [facility];  Service: Cardiovascular;  Laterality: N/A;   ESOPHAGOGASTRODUODENOSCOPY (EGD) WITH PROPOFOL N/A 01/25/2018   Procedure: ESOPHAGOGASTRODUODENOSCOPY (EGD) WITH PROPOFOL;  Surgeon: Jerene Bears, MD;  Location: [place];  Service: Gastroenterology;  Laterality: N/A;   ESOPHAGOGASTRODUODENOSCOPY (EGD) WITH PROPOFOL N/A 04/10/2019   Procedure: ESOPHAGOGASTRODUODENOSCOPY (EGD) WITH PROPOFOL;  Surgeon: Jerene Bears, MD;  Location: [facility];  Service: Gastroenterology;  Laterality: N/A;   FLEXIBLE SIGMOIDOSCOPY N/A 07/15/2017   Procedure: FLEXIBLE SIGMOIDOSCOPY;  Surgeon: Carol Ada, MD;  Location: [place];  Service: Endoscopy;  Laterality: N/A;   HEMORRHOID BANDING     I & D EXTREMITY Left 06/01/2017   Procedure: IRRIGATION AND DEBRIDEMENT LEFT ARM HEMATOMA WITH LIGATION OF LEFT ARM AV FISTULA;  Surgeon: Elam Dutch, MD;  Location: [place];  Service: Vascular;  Laterality: Left;   I & D EXTREMITY Left 06/14/2017   Procedure: IRRIGATION AND DEBRIDEMENT EXTREMITY;  Surgeon: Katha Cabal, MD;  Location: [facility];  Service: Vascular;  Laterality: Left;   INSERTION OF DIALYSIS CATHETER  05/30/2017   INSERTION OF DIALYSIS CATHETER N/A 05/30/2017   Procedure: INSERTION OF DIALYSIS CATHETER;  Surgeon: Elam Dutch, MD;  Location: [place];  Service: Vascular;  Laterality: N/A;   IR PARACENTESIS  08/30/2017   IR PARACENTESIS  09/29/2017   IR PARACENTESIS  10/28/2017   IR PARACENTESIS  11/09/2017   IR PARACENTESIS  11/16/2017   IR PARACENTESIS  11/28/2017   IR PARACENTESIS  12/01/2017   IR PARACENTESIS  12/06/2017   IR PARACENTESIS  01/03/2018   IR PARACENTESIS  01/23/2018   IR PARACENTESIS  02/07/2018   IR PARACENTESIS  02/21/2018   IR PARACENTESIS  03/06/2018   IR PARACENTESIS  03/17/2018   IR  PARACENTESIS  04/04/2018   IR PARACENTESIS  12/28/2018   IR PARACENTESIS  01/08/2019   IR PARACENTESIS  01/23/2019   IR PARACENTESIS  02/01/2019   IR PARACENTESIS  02/19/2019   IR PARACENTESIS  03/01/2019   IR PARACENTESIS  03/15/2019   IR PARACENTESIS  04/03/2019   IR PARACENTESIS  04/12/2019   IR PARACENTESIS  05/01/2019   IR PARACENTESIS  05/08/2019   IR PARACENTESIS  05/24/2019   IR PARACENTESIS  06/12/2019   IR PARACENTESIS  07/09/2019   IR PARACENTESIS  07/27/2019   IR PARACENTESIS  08/09/2019   IR PARACENTESIS  08/21/2019   IR PARACENTESIS  09/17/2019   IR PARACENTESIS  10/05/2019   IR PARACENTESIS  10/29/2019   IR PARACENTESIS  11/08/2019  IR PARACENTESIS  12/12/2019   IR PARACENTESIS  01/03/2020   IR PARACENTESIS  01/10/2020   IR PARACENTESIS  01/17/2020   IR PARACENTESIS  01/24/2020   IR PARACENTESIS  01/31/2020   IR PARACENTESIS  02/07/2020   IR PARACENTESIS  02/21/2020   IR PARACENTESIS  05/21/2020   IR PARACENTESIS  10/10/2020   IR PARACENTESIS  10/28/2020   IR PARACENTESIS  11/18/2020   IR PARACENTESIS  12/02/2020   IR PARACENTESIS  12/11/2020   IR PARACENTESIS  12/18/2020   IR PARACENTESIS  12/30/2020   IR PARACENTESIS  01/06/2021   IR PARACENTESIS  01/13/2021   IR PARACENTESIS  01/20/2021   IR PARACENTESIS  01/27/2021   IR PARACENTESIS  02/03/2021   IR PARACENTESIS  02/10/2021   IR PARACENTESIS  02/17/2021   IR PARACENTESIS  02/24/2021   IR RADIOLOGIST EVAL & MGMT  02/14/2018   IR RADIOLOGIST EVAL & MGMT  02/22/2019   LEFT HEART CATH AND CORONARY ANGIOGRAPHY N/A 02/22/2017   Procedure: LEFT HEART CATH AND CORONARY ANGIOGRAPHY;  Surgeon: Nigel Mormon, MD;  Location: [place] [facility];  Service: Cardiovascular;  Laterality: N/A;   LIGATION OF ARTERIOVENOUS  FISTULA Left 11/09/256   Procedure: Plication of Left Arm Arteriovenous Fistula;  Surgeon: Elam Dutch, MD;  Location[facility];  Service: Vascular;  Laterality: Left;   POLYPECTOMY     POLYPECTOMY  01/25/2018   Procedure: POLYPECTOMY;   Surgeon: Jerene Bears, MD;  Location: [place];  Service: Gastroenterology;;   REVISON OF ARTERIOVENOUS FISTULA Left 12/06/7822   Procedure: PLICATION OF DISTAL ANEURYSMAL SEGEMENT OF LEFT UPPER ARM ARTERIOVENOUS FISTULA;  Surgeon: Elam Dutch, MD;  Location: [place];  Service: Vascular;  Laterality: Left;   REVISON OF ARTERIOVENOUS FISTULA Left 2/35/3614   Procedure: Plication of Left Upper Arm Fistula ;  Surgeon: Waynetta Sandy, MD;  Location: [place];  Service: Vascular;  Laterality: Left;   SKIN GRAFT SPLIT THICKNESS LEG / FOOT Left    SKIN GRAFT SPLIT THICKNESS LEFT ARM DONOR SITE: LEFT ANTERIOR THIGH   SKIN SPLIT GRAFT Left 07/04/2017   Procedure: SKIN GRAFT SPLIT THICKNESS LEFT ARM DONOR SITE: LEFT ANTERIOR THIGH;  Surgeon: Elam Dutch, MD;  Location: [place];  Service: Vascular;  Laterality: Left;   THROMBECTOMY W/ EMBOLECTOMY Left 06/05/2017   Procedure: EXPLORATION OF LEFT ARM FOR BLEEDING; OVERSEWED PROXIMAL FISTULA;  Surgeon: Angelia Mould, MD;  Location: [place] [facility];  Service: Vascular;  Laterality: Left;   WOUND EXPLORATION Left 06/03/2017   Procedure: WOUND EXPLORATION WITH WOUND VAC APPLICATION TO LEFT ARM;  Surgeon: Angelia Mould, MD;  Location: [facility] OR;  Service: Vascular;  Laterality: Left;   Family History  Family History  Problem Relation Age of Onset   Heart disease Mother    Lung cancer Mother    Heart disease Father    Malignant hyperthermia Father    COPD Father    Throat cancer Sister    Esophageal cancer Sister    Hypertension Other    COPD Other    Throat cancer Sister    Colon cancer Neg Hx    Colon polyps Neg Hx    Rectal cancer Neg Hx    Stomach cancer Neg Hx    Social History  reports that he has been smoking cigarettes. He started smoking about 47 years ago. He has a 21.50 pack-year smoking history. He has never used smokeless tobacco. He reports that he does not currently use alcohol.  He reports current drug use. Drugs:  Marijuana and Cocaine. Allergies  Allergies  Allergen Reactions   Tramadol Itching and Other (See Comments)    Other reaction(s): Unknown   Grass Extracts [Gramineae Pollens]     Other reaction(s): Sneezing   Morphine And Related Other (See Comments)    Stomach pain   Pollen Extract Other (See Comments)    Other reaction(s): Sneezing (finding)   Acetaminophen Nausea Only    Stomach ache    Aspirin Itching and Other (See Comments)    STOMACH PAIN  Other reaction(s): Unknown   Clonidine Derivatives Itching   Home medications Prior to Admission medications   Medication Sig Start Date End Date Taking? Authorizing Provider  acetaminophen (TYLENOL) 500 MG tablet Take 500-1,000 mg by mouth every 6 (six) hours as needed (pain.).    [provider]  albuterol (VENTOLIN HFA) 108 (90 Base) MCG/ACT inhaler INHALE 2 PUFFS BY MOUTH EVERY 4 HOURS AS NEEDED FOR WHEEZE OR FOR SHORTNESS OF BREATH Patient taking differently: Inhale 2 puffs into the lungs every 4 (four) hours as needed for wheezing or shortness of breath. 02/06/21   Cantwell, Celeste C, PA-C  amiodarone (PACERONE) 200 MG tablet TAKE 1 TABLET BY MOUTH EVERY DAY Patient taking differently: Take 200 mg by mouth in the morning. 09/29/20   Cantwell, Celeste C, PA-C  cetirizine (ZYRTEC) 10 MG tablet Take 10 mg by mouth daily as needed for allergies. 06/23/20   [provider]  cinacalcet (SENSIPAR) 30 MG tablet TAKE 2 TABLETS (60 MG TOTAL) BY MOUTH EVERY MONDAY, WEDNESDAY, AND FRIDAY WITH HEMODIALYSIS. Patient taking differently: Take 60 mg by mouth See admin instructions. Monday,Wednesday and friday 09/12/20 09/12/21  Florencia Reasons, MD  diltiazem (CARDIZEM CD) 120 MG 24 hr capsule TAKE 1 CAPSULE BY MOUTH EVERY DAY Patient taking differently: Take 120 mg by mouth daily. 12/15/20   Adrian Prows, MD  doxycycline (VIBRAMYCIN) 100 MG capsule Take 100 mg by mouth 2 (two) times daily. 11/05/20   [provider]  gabapentin (NEURONTIN)  300 MG capsule TAKE 1 CAPSULE BY MOUTH IN THE MORNING AND AT BEDTIME Patient taking differently: Take 300 mg by mouth 2 (two) times daily. 11/14/20   Charlott Rakes, MD  lactulose (CHRONULAC) 10 GM/15ML solution Take 30 mLs (20 g total) by mouth daily. Patient taking differently: Take 20 g by mouth daily as needed (constipation). 09/12/20   Florencia Reasons, MD  lidocaine-prilocaine (EMLA) cream Apply 1 application topically every Monday, Wednesday, and Friday with hemodialysis. Applied sparingly for port access 11/01/20   [provider]  metoprolol succinate (TOPROL-XL) 25 MG 24 hr tablet Take 0.5 tablets (12.5 mg total) by mouth daily. 01/29/21   Cantwell, Celeste C, PA-C  ondansetron (ZOFRAN) 4 MG tablet TAKE 1 TABLET BY MOUTH EVERY 8 HOURS AS NEEDED FOR NAUSEA AND VOMITING Patient taking differently: Take 4 mg by mouth every 8 (eight) hours as needed for vomiting or nausea. 12/15/20   Charlott Rakes, MD  oxyCODONE (ROXICODONE) 5 MG immediate release tablet 1 tab PO q6 hours prn pain Patient not taking: Reported on 03/03/2021 11/13/20   Leanora Cover, MD  Oxycodone HCl 10 MG TABS TAKE 1 TABLET (10 MG TOTAL) BY MOUTH THREE TIMES DAILY AS NEEDED PAIN. Patient taking differently: Take 10 mg by mouth 3 (three) times daily as needed (pain). 09/12/20 03/11/21  Florencia Reasons, MD  pantoprazole (PROTONIX) 40 MG tablet Take 40 mg by mouth daily.    [provider]  polyethylene glycol (MIRALAX /  GLYCOLAX) 17 g packet Take 17 g by mouth daily as needed (constipation).    [provider]  sevelamer carbonate (RENVELA) 800 MG tablet Take 800 mg by mouth 3 (three) times daily with meals.    [provider]     Vitals:   03/04/21 1445 03/04/21 1500 03/04/21 1503 03/04/21 1545  BP: (!) 83/64 97/73 97/73  (!) 99/48  Pulse:   67   Resp: (!) 21 20 (!) 21 18  Temp:      TempSrc:      SpO2:   94%   Weight:      Height:       Exam General: sitting in bed with no apparent distress HEENT:  anicteric sclera, MMM CV: normal rate, no murmurs, trace edema in the bilateral ankles Lungs: bilateral chest rise, normal wob Abd: soft, non-tender, mild distention Skin: no visible lesions or rashes Psych: awake and alert, angry mood, guarded affect Neuro: normal speech, oriented to person and place but not time or situation     Home meds include amiodarone, sensipar , cardizem cd 120 qd, neurontin 300 bid, toprol xl 12.5 qd, oxycodone IR prn, protonix, renvela 800 tid ac, prns'     OP HD: [facility] [place]  4h  2/2 bath 450 bfr  69kg   aVG    - 1.5 ug calcitriol tiw   Assessment/ Plan: ESRD: plan for HD tonight for severe hyperkalemia Severe hyperkalemia - getting temporizing measures per ED providers. Plan HD this evening upstairs on low K + bath.  EKG shows old LBBB, not helpful in determining K+ effects.  Volume/ BP: BP's low to normal, not grossly overloaded on exam. UF 2 L as tolerated. Anemia of Chronic Kidney Disease: Hemoglobin 10.3. No ESA MBD ckd: Phos 7.3. Continue home binders and calcitriol. Vascular access: AVG with no issues Hyponatremia:  sodium 124, should improve with HD History of Hep B/C: caution on HD, isolation.       [name]  MD 03/04/2021, 4:11 PM  Recent Labs  Lab 03/03/21 0307 03/04/21 1430  WBC 6.9 12.2*  HGB 10.3* 10.5*   Recent Labs  Lab 03/03/21 0638 03/04/21 1430  K 6.0* 7.1*  BUN 52* 61*  CREATININE 12.34* 13.55*  CALCIUM 8.1* 7.5*  PHOS 7.3*  --

## 2021-03-04 NOTE — ED Provider Notes (Signed)
  Face-to-face evaluation   History: Middle-age male, presenting for evaluation of general weakness.  He was treated by EMS with Narcan with improvement of mental status.  He was hypoxic on room air and improved to normal on nasal cannula oxygen.  Last dialysis was about a week ago.  He left AMA from the ED yesterday after being evaluated for complications of end-stage renal disease, and having admission arranged.  Physical exam: Resting comfortably, sleeping, in no distress.   Medical screening examination/treatment/procedure(s) were conducted as a shared visit with non-physician practitioner(s) and myself.  I personally evaluated the patient during the encounter    Daleen Bo, MD 03/05/21 1454

## 2021-03-04 NOTE — H&P (Signed)
[facility] History and Physical  Chibuikem Thang YTK:[id number] DOB: 1961/03/01 DOA: 03/04/2021  Referring physician: ED  PCP: Charlott Rakes, MD   Patient is coming from: Home  Chief Complaint: Generalized weakness  HPI: [name] is a 60 y.o. male with past medical history of end-stage renal disease on hemodialysis Monday Wednesday Friday, cirrhosis of liver secondary to alcoholic liver disease, hepatitis B and C, COPD with cor pulmonale, atrial fibrillation, anemia and polysubstance abuse was brought into the hospital with complaints of generalized weakness and the patient was noted to be found on the ground.  EMS was notified and was brought into the hospital.  Of note patient was admitted yesterday but then subsequently left [place].  My interview patient was on oxygen supplement.  He was stating that he was hungry.  Denied any nausea vomiting abdominal pain.  Denied any chest pain but has been having some cough.  Patient smokes but denies any use of alcohol or cocaine.  Patient complains of generalized weakness for past few days and has been having difficulty ambulating.  Of note patient had missed hemodialysis since Wednesday for a week..  Patient denies any fever, chills or rigor.    ED Course: In the ED, patient patient was noted to be mildly hypotensive.    Patient was very somnolent and had pinpoint pupils so received 1.2 mg of Narcan as well.  Patient was little more alert after Narcan injection.  He was also hypoxic on room air so supplemental oxygen was placed in with 2 L of oxygen by nasal cannula.Marland Kitchen  He was afebrile.  Laboratory data was significant for potassium of 7.1, sodium of 125.  EKG showed some tall T waves.  Nephrology was consulted from the ED for urgent hemodialysis.  WBC was elevated at 12.2.  Medical team was consulted for admission to the hospital.  Review of Systems:  All systems were reviewed and were negative unless otherwise  mentioned in the HPI  Past Medical History:  Diagnosis Date   Anemia    Anxiety    Arthritis    left shoulder   Arthritis    Atherosclerosis of aorta (HCC)    Cardiomegaly    Chest pain    DATE UNKNOWN, C/O PERIODICALLY   Chest pain    Cocaine abuse (HCC)    COPD (chronic obstructive pulmonary disease) (HCC)    COPD exacerbation ([place]) 08/17/2016   Coronary artery disease    stent 02/22/17   Coronary artery disease    Dysrhythmia    ESRD (end stage renal disease) ([place])    ESRD (end stage renal disease) on dialysis ([place])    "[name]; M-W-F" (07/04/2017)   GERD (gastroesophageal reflux disease)    DATE UNKNOWN   GERD (gastroesophageal reflux disease)    Hemorrhoids    Hepatitis B    Hepatitis B, chronic (HCC)    Hepatitis C    History of kidney stones    Hyperkalemia    Hypertension    Kidney failure    Metabolic bone disease    Patient denies   Mitral stenosis    Myocardial infarction [facility])    Pneumonia    Pulmonary edema    Renal disorder    Solitary rectal ulcer syndrome 07/2017   at flex sig for rectal bleeding   Solitary rectal ulcer syndrome    Tubular adenoma of colon    Past Surgical History:  Procedure Laterality Date   A/V FISTULAGRAM Left 05/26/2017  Procedure: A/V FISTULAGRAM;  Surgeon: Conrad [place], MD;  Location: [place] [facility];  Service: Cardiovascular;  Laterality: Left;   A/V FISTULAGRAM Right 11/18/2017   Procedure: A/V FISTULAGRAM - Right Arm;  Surgeon: Elam Dutch, MD;  Location: [place] [facility];  Service: Cardiovascular;  Laterality: Right;   AMPUTATION Right 09/04/2020   Procedure: RIGHT INDEX AND RIGHT RING FINGER AMPUTATION DIGIT;  Surgeon: Leanora Cover, MD;  Location: [place];  Service: Orthopedics;  Laterality: Right;   AMPUTATION Right 11/13/2020   Procedure: RIGHT INDEX FINGER AMPUTATION AND RIGHT RING FINGER AMPUTATION;  Surgeon: Leanora Cover, MD;  Location: [place];  Service: Orthopedics;  Laterality: Right;   APPLICATION OF  WOUND VAC Left 06/14/2017   Procedure: APPLICATION OF WOUND VAC;  Surgeon: Katha Cabal, MD;  Location: [facility];  Service: Vascular;  Laterality: Left;   AV FISTULA PLACEMENT  2012   BELIEVED WAS PLACED IN JUNE   AV FISTULA PLACEMENT Right 08/09/2017   Procedure: Creation Right arm ARTERIOVENOUS BRACHIOCEPOHALIC FISTULA;  Surgeon: Elam Dutch, MD;  Location: [facility] OR;  Service: Vascular;  Laterality: Right;   AV FISTULA PLACEMENT Right 11/22/2017   Procedure: INSERTION OF ARTERIOVENOUS (AV) GORE-TEX GRAFT RIGHT UPPER ARM;  Surgeon: Elam Dutch, MD;  Location: [place];  Service: Vascular;  Laterality: Right;   BIOPSY  01/25/2018   Procedure: BIOPSY;  Surgeon: Jerene Bears, MD;  Location: [place];  Service: Gastroenterology;;   BIOPSY  04/10/2019   Procedure: BIOPSY;  Surgeon: Jerene Bears, MD;  Location: [facility];  Service: Gastroenterology;;   COLONOSCOPY     COLONOSCOPY WITH PROPOFOL N/A 01/25/2018   Procedure: COLONOSCOPY WITH PROPOFOL;  Surgeon: Jerene Bears, MD;  Location: [place];  Service: Gastroenterology;  Laterality: N/A;   CORONARY STENT INTERVENTION N/A 02/22/2017   Procedure: CORONARY STENT INTERVENTION;  Surgeon: Nigel Mormon, MD;  Location: [place] [facility];  Service: Cardiovascular;  Laterality: N/A;   ESOPHAGOGASTRODUODENOSCOPY (EGD) WITH PROPOFOL N/A 01/25/2018   Procedure: ESOPHAGOGASTRODUODENOSCOPY (EGD) WITH PROPOFOL;  Surgeon: Jerene Bears, MD;  Location: [place];  Service: Gastroenterology;  Laterality: N/A;   ESOPHAGOGASTRODUODENOSCOPY (EGD) WITH PROPOFOL N/A 04/10/2019   Procedure: ESOPHAGOGASTRODUODENOSCOPY (EGD) WITH PROPOFOL;  Surgeon: Jerene Bears, MD;  Location: [facility];  Service: Gastroenterology;  Laterality: N/A;   FLEXIBLE SIGMOIDOSCOPY N/A 07/15/2017   Procedure: FLEXIBLE SIGMOIDOSCOPY;  Surgeon: Carol Ada, MD;  Location: [place];  Service: Endoscopy;  Laterality: N/A;   HEMORRHOID BANDING     I & D EXTREMITY  Left 06/01/2017   Procedure: IRRIGATION AND DEBRIDEMENT LEFT ARM HEMATOMA WITH LIGATION OF LEFT ARM AV FISTULA;  Surgeon: Elam Dutch, MD;  Location: [place];  Service: Vascular;  Laterality: Left;   I & D EXTREMITY Left 06/14/2017   Procedure: IRRIGATION AND DEBRIDEMENT EXTREMITY;  Surgeon: Katha Cabal, MD;  Location: [facility];  Service: Vascular;  Laterality: Left;   INSERTION OF DIALYSIS CATHETER  05/30/2017   INSERTION OF DIALYSIS CATHETER N/A 05/30/2017   Procedure: INSERTION OF DIALYSIS CATHETER;  Surgeon: Elam Dutch, MD;  Location: [place];  Service: Vascular;  Laterality: N/A;   IR PARACENTESIS  08/30/2017   IR PARACENTESIS  09/29/2017   IR PARACENTESIS  10/28/2017   IR PARACENTESIS  11/09/2017   IR PARACENTESIS  11/16/2017   IR PARACENTESIS  11/28/2017   IR PARACENTESIS  12/01/2017   IR PARACENTESIS  12/06/2017   IR PARACENTESIS  01/03/2018   IR  PARACENTESIS  01/23/2018   IR PARACENTESIS  02/07/2018   IR PARACENTESIS  02/21/2018   IR PARACENTESIS  03/06/2018   IR PARACENTESIS  03/17/2018   IR PARACENTESIS  04/04/2018   IR PARACENTESIS  12/28/2018   IR PARACENTESIS  01/08/2019   IR PARACENTESIS  01/23/2019   IR PARACENTESIS  02/01/2019   IR PARACENTESIS  02/19/2019   IR PARACENTESIS  03/01/2019   IR PARACENTESIS  03/15/2019   IR PARACENTESIS  04/03/2019   IR PARACENTESIS  04/12/2019   IR PARACENTESIS  05/01/2019   IR PARACENTESIS  05/08/2019   IR PARACENTESIS  05/24/2019   IR PARACENTESIS  06/12/2019   IR PARACENTESIS  07/09/2019   IR PARACENTESIS  07/27/2019   IR PARACENTESIS  08/09/2019   IR PARACENTESIS  08/21/2019   IR PARACENTESIS  09/17/2019   IR PARACENTESIS  10/05/2019   IR PARACENTESIS  10/29/2019   IR PARACENTESIS  11/08/2019   IR PARACENTESIS  12/12/2019   IR PARACENTESIS  01/03/2020   IR PARACENTESIS  01/10/2020   IR PARACENTESIS  01/17/2020   IR PARACENTESIS  01/24/2020   IR PARACENTESIS  01/31/2020   IR PARACENTESIS  02/07/2020   IR PARACENTESIS  02/21/2020   IR PARACENTESIS   05/21/2020   IR PARACENTESIS  10/10/2020   IR PARACENTESIS  10/28/2020   IR PARACENTESIS  11/18/2020   IR PARACENTESIS  12/02/2020   IR PARACENTESIS  12/11/2020   IR PARACENTESIS  12/18/2020   IR PARACENTESIS  12/30/2020   IR PARACENTESIS  01/06/2021   IR PARACENTESIS  01/13/2021   IR PARACENTESIS  01/20/2021   IR PARACENTESIS  01/27/2021   IR PARACENTESIS  02/03/2021   IR PARACENTESIS  02/10/2021   IR PARACENTESIS  02/17/2021   IR PARACENTESIS  02/24/2021   IR RADIOLOGIST EVAL & MGMT  02/14/2018   IR RADIOLOGIST EVAL & MGMT  02/22/2019   LEFT HEART CATH AND CORONARY ANGIOGRAPHY N/A 02/22/2017   Procedure: LEFT HEART CATH AND CORONARY ANGIOGRAPHY;  Surgeon: Nigel Mormon, MD;  Location: [place] [facility];  Service: Cardiovascular;  Laterality: N/A;   LIGATION OF ARTERIOVENOUS  FISTULA Left 03/17/931   Procedure: Plication of Left Arm Arteriovenous Fistula;  Surgeon: Elam Dutch, MD;  Location: [facility] OR;  Service: Vascular;  Laterality: Left;   POLYPECTOMY     POLYPECTOMY  01/25/2018   Procedure: POLYPECTOMY;  Surgeon: Jerene Bears, MD;  Location: [place];  Service: Gastroenterology;;   REVISON OF ARTERIOVENOUS FISTULA Left 6/71/2458   Procedure: PLICATION OF DISTAL ANEURYSMAL SEGEMENT OF LEFT UPPER ARM ARTERIOVENOUS FISTULA;  Surgeon: Elam Dutch, MD;  Location: [place];  Service: Vascular;  Laterality: Left;   REVISON OF ARTERIOVENOUS FISTULA Left 0/99/8338   Procedure: Plication of Left Upper Arm Fistula ;  Surgeon: Waynetta Sandy, MD;  Location: [place];  Service: Vascular;  Laterality: Left;   SKIN GRAFT SPLIT THICKNESS LEG / FOOT Left    SKIN GRAFT SPLIT THICKNESS LEFT ARM DONOR SITE: LEFT ANTERIOR THIGH   SKIN SPLIT GRAFT Left 07/04/2017   Procedure: SKIN GRAFT SPLIT THICKNESS LEFT ARM DONOR SITE: LEFT ANTERIOR THIGH;  Surgeon: Elam Dutch, MD;  Location: [place];  Service: Vascular;  Laterality: Left;   THROMBECTOMY W/ EMBOLECTOMY Left 06/05/2017   Procedure: EXPLORATION  OF LEFT ARM FOR BLEEDING; OVERSEWED PROXIMAL FISTULA;  Surgeon: Angelia Mould, MD;  Location: [place];  Service: Vascular;  Laterality: Left;   WOUND EXPLORATION Left 06/03/2017   Procedure: WOUND EXPLORATION  WITH WOUND VAC APPLICATION TO LEFT ARM;  Surgeon: Angelia Mould, MD;  Location: [place];  Service: Vascular;  Laterality: Left;    Social History:  reports that he has been smoking cigarettes. He started smoking about 47 years ago. He has a 21.50 pack-year smoking history. He has never used smokeless tobacco. He reports that he does not currently use alcohol. He reports current drug use. Drugs: Marijuana and Cocaine.  Allergies  Allergen Reactions   Tramadol Itching and Other (See Comments)    Other reaction(s): Unknown   Grass Extracts [Gramineae Pollens]     Other reaction(s): Sneezing   Morphine And Related Other (See Comments)    Stomach pain   Pollen Extract Other (See Comments)    Other reaction(s): Sneezing (finding)   Acetaminophen Nausea Only    Stomach ache    Aspirin Itching and Other (See Comments)    STOMACH PAIN  Other reaction(s): Unknown   Clonidine Derivatives Itching    Family History  Problem Relation Age of Onset   Heart disease Mother    Lung cancer Mother    Heart disease Father    Malignant hyperthermia Father    COPD Father    Throat cancer Sister    Esophageal cancer Sister    Hypertension Other    COPD Other    Throat cancer Sister    Colon cancer Neg Hx    Colon polyps Neg Hx    Rectal cancer Neg Hx    Stomach cancer Neg Hx      Prior to Admission medications   Medication Sig Start Date End Date Taking? Authorizing Provider  acetaminophen (TYLENOL) 500 MG tablet Take 500-1,000 mg by mouth every 6 (six) hours as needed (pain.).    [provider]  albuterol (VENTOLIN HFA) 108 (90 Base) MCG/ACT inhaler INHALE 2 PUFFS BY MOUTH EVERY 4 HOURS AS NEEDED FOR WHEEZE OR FOR SHORTNESS OF BREATH Patient taking differently:  Inhale 2 puffs into the lungs every 4 (four) hours as needed for wheezing or shortness of breath. 02/06/21   Cantwell, Celeste C, PA-C  amiodarone (PACERONE) 200 MG tablet TAKE 1 TABLET BY MOUTH EVERY DAY Patient taking differently: Take 200 mg by mouth in the morning. 09/29/20   Cantwell, Celeste C, PA-C  cetirizine (ZYRTEC) 10 MG tablet Take 10 mg by mouth daily as needed for allergies. 06/23/20   [provider]  cinacalcet (SENSIPAR) 30 MG tablet TAKE 2 TABLETS (60 MG TOTAL) BY MOUTH EVERY MONDAY, WEDNESDAY, AND FRIDAY WITH HEMODIALYSIS. Patient taking differently: Take 60 mg by mouth See admin instructions. Monday,Wednesday and friday 09/12/20 09/12/21  Florencia Reasons, MD  diltiazem (CARDIZEM CD) 120 MG 24 hr capsule TAKE 1 CAPSULE BY MOUTH EVERY DAY Patient taking differently: Take 120 mg by mouth daily. 12/15/20   Adrian Prows, MD  doxycycline (VIBRAMYCIN) 100 MG capsule Take 100 mg by mouth 2 (two) times daily. 11/05/20   [provider]  gabapentin (NEURONTIN) 300 MG capsule TAKE 1 CAPSULE BY MOUTH IN THE MORNING AND AT BEDTIME Patient taking differently: Take 300 mg by mouth 2 (two) times daily. 11/14/20   Charlott Rakes, MD  lactulose (CHRONULAC) 10 GM/15ML solution Take 30 mLs (20 g total) by mouth daily. Patient taking differently: Take 20 g by mouth daily as needed (constipation). 09/12/20   Florencia Reasons, MD  lidocaine-prilocaine (EMLA) cream Apply 1 application topically every Monday, Wednesday, and Friday with hemodialysis. Applied sparingly for port access 11/01/20   [provider]  metoprolol succinate (TOPROL-XL) 25 MG 24 hr tablet Take 0.5 tablets (12.5 mg total) by mouth daily. 01/29/21   Cantwell, Celeste C, PA-C  ondansetron (ZOFRAN) 4 MG tablet TAKE 1 TABLET BY MOUTH EVERY 8 HOURS AS NEEDED FOR NAUSEA AND VOMITING Patient taking differently: Take 4 mg by mouth every 8 (eight) hours as needed for vomiting or nausea. 12/15/20   Charlott Rakes, MD  oxyCODONE (ROXICODONE) 5 MG  immediate release tablet 1 tab PO q6 hours prn pain Patient not taking: Reported on 03/03/2021 11/13/20   Leanora Cover, MD  Oxycodone HCl 10 MG TABS TAKE 1 TABLET (10 MG TOTAL) BY MOUTH THREE TIMES DAILY AS NEEDED PAIN. Patient taking differently: Take 10 mg by mouth 3 (three) times daily as needed (pain). 09/12/20 03/11/21  Florencia Reasons, MD  pantoprazole (PROTONIX) 40 MG tablet Take 40 mg by mouth daily.    [provider]  polyethylene glycol (MIRALAX / GLYCOLAX) 17 g packet Take 17 g by mouth daily as needed (constipation).    [provider]  sevelamer carbonate (RENVELA) 800 MG tablet Take 800 mg by mouth 3 (three) times daily with meals.    [provider]    Physical Exam: Vitals:   03/04/21 1445 03/04/21 1500 03/04/21 1503 03/04/21 1545  BP: (!) 83/64 97/73 97/73  (!) 99/48  Pulse:   67   Resp: (!) 21 20 (!) 21 18  Temp:      TempSrc:      SpO2:   94%   Weight:      Height:       Wt Readings from Last 3 Encounters:  03/04/21 68.9 kg  03/02/21 68.9 kg  01/29/21 71.2 kg   Body mass index is 22.45 kg/m.  General: Alert awake and communicative at the time of my exam, on nasal cannula oxygen, following commands, Average built, not in obvious distress, chronically ill HENT: Normocephalic, Pupils reactive but still small.  No scleral pallor or icterus noted. Oral mucosa is moist.  Chest: Coarse breath sounds noted.  Diminished breath sounds bilaterally.  Occasional rhonchi CVS: S1 &S2 heard. No murmur.  Irregular rhythm tachycardic Abdomen: Soft,  mildly distended but nontender bowel sounds are heard.  Liver is not palpable, no abdominal mass palpated Extremities: No cyanosis, clubbing but trace edema peripheral pulses are palpable. Psych: Alert, awake and oriented to place, communicative, appears anxious CNS:  No cranial nerve deficits.  Power equal in all extremities.    Skin: Warm and dry.  No rashes noted.  Labs on Admission:   CBC: Recent Labs  Lab  03/02/21 1948 03/02/21 2007 03/03/21 0307 03/04/21 1430  WBC 7.5  --  6.9 12.2*  NEUTROABS 5.8  --   --  9.9*  HGB 10.3* 9.9* 10.3* 10.5*  HCT 29.3* 29.0* 29.3* 30.7*  MCV 78.6*  --  77.5* 79.9*  PLT 187  --  178 354    Basic Metabolic Panel: Recent Labs  Lab 03/02/21 1948 03/02/21 2007 03/03/21 0307 03/03/21 0638 03/04/21 1430  NA 128* 126*  --  124* 125*  K 6.7* 6.5*  --  6.0* 7.1*  CL 88* 91*  --  85* 90*  CO2 23  --   --  25 21*  GLUCOSE 59* 52*  --  182* 89  BUN 54* 47*  --  52* 61*  CREATININE 12.50* 12.40* 12.52* 12.34* 13.55*  CALCIUM 8.3*  --   --  8.1* 7.5*  PHOS  --   --   --  7.3*  --     Liver Function Tests: Recent Labs  Lab 03/02/21 1948 03/03/21 0638  AST 52*  --   ALT 17  --   ALKPHOS 183*  --   BILITOT 1.1  --   PROT 5.8*  --   ALBUMIN 2.1* 2.1*   No results for input(s): LIPASE, AMYLASE in the last 168 hours. Recent Labs  Lab 03/03/21 0307  AMMONIA 23    Cardiac Enzymes: No results for input(s): CKTOTAL, CKMB, CKMBINDEX, TROPONINI in the last 168 hours.  BNP (last 3 results) No results for input(s): BNP in the last 8760 hours.  ProBNP (last 3 results) No results for input(s): PROBNP in the last 8760 hours.  CBG: Recent Labs  Lab 03/03/21 0344 03/03/21 0551 03/03/21 0958 03/03/21 1220 03/04/21 1431  GLUCAP 117* 109* 80 96 87    Lipase     Component Value Date/Time   LIPASE 34 11/17/2020 1115     Urinalysis    Component Value Date/Time   COLORURINE YELLOW 07/16/2011 1619   APPEARANCEUR CLEAR 07/16/2011 1619   LABSPEC 1.018 07/16/2011 1619   PHURINE 7.5 07/16/2011 1619   GLUCOSEU 250 (A) 07/16/2011 1619   HGBUR MODERATE (A) 07/16/2011 1619   BILIRUBINUR SMALL (A) 07/16/2011 1619   KETONESUR NEGATIVE 07/16/2011 1619   PROTEINUR >300 (A) 07/16/2011 1619   UROBILINOGEN 1.0 07/16/2011 1619   NITRITE NEGATIVE 07/16/2011 1619   LEUKOCYTESUR SMALL (A) 07/16/2011 1619     Drugs of Abuse     Component Value  Date/Time   LABOPIA NONE DETECTED 08/17/2016 0920   COCAINSCRNUR NONE DETECTED 08/17/2016 0920   LABBENZ NONE DETECTED 08/17/2016 0920   AMPHETMU NONE DETECTED 08/17/2016 0920   THCU NONE DETECTED 08/17/2016 0920   LABBARB NONE DETECTED 08/17/2016 0920      Radiological Exams on Admission: CT HEAD WO CONTRAST (5MM)  Result Date: 03/02/2021 CLINICAL DATA:  Altered level of consciousness, hypoglycemia EXAM: CT HEAD WITHOUT CONTRAST TECHNIQUE: Contiguous axial images were obtained from the base of the skull through the vertex without intravenous contrast. COMPARISON:  01/11/2017 FINDINGS: Brain: Chronic hypodensities are seen bilaterally within the periventricular white matter and basal ganglia consistent with chronic small vessel ischemic changes. No signs of acute infarct or hemorrhage. The lateral ventricles and remaining midline structures are unremarkable. No acute extra-axial fluid collections. No mass effect. Vascular: Extensive atherosclerosis unchanged. No hyperdense vessel. Skull: Normal. Negative for fracture or focal lesion. Sinuses/Orbits: No acute finding. Other: None. IMPRESSION: 1. Chronic small vessel ischemic change as above. No acute intracranial process. Electronically Signed   By: Randa Ngo M.D.   On: 03/02/2021 23:03   DG Chest Portable 1 View  Result Date: 03/02/2021 CLINICAL DATA:  Altered mental status EXAM: PORTABLE CHEST 1 VIEW COMPARISON:  Chest radiograph 09/20/2020 FINDINGS: The heart is markedly enlarged the mediastinal contours are grossly within normal limits. There is calcified atherosclerotic plaque of the vasculature. There is vascular congestion without definite frank interstitial edema. Patchy opacities in the lung bases likely reflect subsegmental atelectasis. There is a trace right effusion. There is no definite left effusion. There is no appreciable pneumothorax. There is no acute osseous abnormality. IMPRESSION: 1. Bibasilar subsegmental atelectasis and  vascular congestion but no definite frank interstitial edema. 2. Trace right pleural effusion. 3. Marked cardiomegaly. Electronically Signed   By: Valetta Mole M.D.   On: 03/02/2021 20:25    EKG: Personally reviewed by me which shows tall T waves  Assessment/Plan Active Problems:  Hyperkalemia   Metabolic encephalopathy likely secondary to use of narcotics and missed hemodialysis with underlying liver disease.Marland Kitchen  Received Narcan in ED.  We will get urgent hemodialysis.  Continue lactulose.  Hyperkalemia with EKG changes.  Patient received temporizing measures in the ED including D10 calcium gluconate and insulin.  Will need urgent hemodialysis.  Hyponatremia.  Secondary to volume overload.  Will likely improve after hemodialysis.  Check labs in AM.  End-stage renal disease on hemodialysis.  Missed dialysis sessions.  Was given Lokelma in the ED as well.  Will need resumption of hemodialysis.  Nephrology has been notified.  History of atrial fibrillation on amiodarone and metoprolol at home.  Will resume while in the hospital as tolerated.  COPD with cor pulmonale.  We will continue with nebulizer.  Anemia of chronic kidney disease.  We will follow CBC.  History of ischemia to the hand status post right index and ring finger amputation.  Continues to smoke.  We will put the patient on nicotine patch.  History polysubstance abuse Reported that he is not using cocaine or alcohol anymore.  He however continues to smoke.  Chronic pain.  Patient is on oxycodone and Neurontin at home.  Hold for now  DVT Prophylaxis: Heparin subcu  Consultant: Nephrology  Code Status: Full code  Microbiology none  Antibiotics: None  Family Communication:  Patients' condition and plan of care including tests being ordered have been discussed with the patient who indicate understanding and agree with the plan.  Status is: Inpatient  Remains inpatient appropriate because:Unsafe d/c plan, IV  treatments appropriate due to intensity of illness or inability to take PO, and Inpatient level of care appropriate due to severity of illness  Dispo: The patient is from: Home              Anticipated d/c is to: Home              Patient currently is not medically stable to d/c.   Difficult to place patient No  Severity of Illness: The appropriate patient status for this patient is INPATIENT. Inpatient status is judged to be reasonable and necessary in order to provide the required intensity of service to ensure the patient's safety. The patient's presenting symptoms, physical exam findings, and initial radiographic and laboratory data in the context of their chronic comorbidities is felt to place them at high risk for further clinical deterioration. Furthermore, it is not anticipated that the patient will be medically stable for discharge from the hospital within 2 midnights of admission. I certify that at the point of admission it is my clinical judgment that the patient will require inpatient hospital care spanning beyond 2 midnights from the point of admission due to high intensity of service, high risk for further deterioration and high frequency of surveillance required.  Signed, Flora Lipps, MD [facility] 03/04/2021

## 2021-03-04 NOTE — ED Provider Notes (Signed)
[facility] EMERGENCY DEPARTMENT Provider Note   CSN: [id number] Arrival date & time: 03/04/21  1323     History Chief Complaint  Patient presents with   Hypotension    Darvin Dials is a 60 y.o. male.  The history is provided by the patient, the EMS personnel and medical records. No language interpreter was used.   60 year old male significant history of end-stage renal disease currently on Monday Wednesday Friday hemodialysis, hypertension, CAD, COPD, GERD, hepatitis B brought here via EMS for evaluation of low blood pressure.  Patient was last seen in the ED 2 days ago for altered mental status and generalized weakness found by neighbor sitting outside slow to respond.  Have missed several dialysis session.  Furthermore he was hypoglycemic.  Labs remarkable for elevated potassium of 6.7 and peaked T waves.  Nephrology was consulted and patient was set to be admitted for dialysis and further treatment.  Unfortunately patient left AMA.  Today he reported he feels very weak and tired and would like to be treated.   Initially EMS noted that his pupil was pinpoint and he was given 1.2 mg of Narcan with improvement of mental status.He denies any significant pain aside from his usual phantom pain that he takes oxycodone for.  Currently patient denies chest pain or abdominal pain.   Past Medical History:  Diagnosis Date   Anemia    Anxiety    Arthritis    left shoulder   Arthritis    Atherosclerosis of aorta (HCC)    Cardiomegaly    Chest pain    DATE UNKNOWN, C/O PERIODICALLY   Chest pain    Cocaine abuse (HCC)    COPD (chronic obstructive pulmonary disease) (HCC)    COPD exacerbation ([place]) 08/17/2016   Coronary artery disease    stent 02/22/17   Coronary artery disease    Dysrhythmia    ESRD (end stage renal disease) ([place])    ESRD (end stage renal disease) on dialysis ([place])    "[name]; M-W-F" (07/04/2017)   GERD (gastroesophageal reflux disease)     DATE UNKNOWN   GERD (gastroesophageal reflux disease)    Hemorrhoids    Hepatitis B    Hepatitis B, chronic (HCC)    Hepatitis C    History of kidney stones    Hyperkalemia    Hypertension    Kidney failure    Metabolic bone disease    Patient denies   Mitral stenosis    Myocardial infarction [facility])    Pneumonia    Pulmonary edema    Renal disorder    Solitary rectal ulcer syndrome 07/2017   at flex sig for rectal bleeding   Solitary rectal ulcer syndrome    Tubular adenoma of colon     Patient Active Problem List   Diagnosis Date Noted   Hypoglycemia 03/03/2021   AMS (altered mental status) 03/03/2021   Acute encephalopathy 03/02/2021   Tylenol overdose 09/07/2020   DNR (do not resuscitate)    CAP (community acquired pneumonia) 02/09/2020   Leukocytosis 01/19/2020   Tobacco use 01/19/2020   Acute pulmonary edema ([place]) 12/21/2019   Acetaminophen overdose 10/27/2019   ESRD (end stage renal disease) ([place])    Goals of care, counseling/discussion    Hypoxia 10/04/2019   Advanced care planning/counseling discussion    Renal failure 09/26/2019   Dyspnea 06/09/2019   Acquired thrombophilia ([place]) 06/05/2019   A-fib ([place]) 05/30/2019   Atrial fibrillation with RVR ([place]) 05/29/2019   Melena  Pressure injury of skin 03/09/2019   Abdominal distention    Volume overload 12/28/2018   Sepsis ([place]) 09/12/2018   Coronary artery disease involving native coronary artery of native heart without angina pectoris 03/11/2018   Benign neoplasm of cecum    Benign neoplasm of ascending colon    Benign neoplasm of descending colon    Benign neoplasm of rectum    AF (paroxysmal atrial fibrillation) ([place]) 01/23/2018   Hx of colonic polyps 01/20/2018   End-stage renal disease on hemodialysis ([place]) 11/21/2017   GERD (gastroesophageal reflux disease) 11/16/2017   Decompensated hepatic cirrhosis ([place]) 11/15/2017   Palliative care by specialist    Hyponatremia 11/04/2017   SBP (spontaneous  bacterial peritonitis) ([place]) 10/30/2017   Liver disease, chronic 10/30/2017   SOB (shortness of breath)    Abdominal pain 10/28/2017   Upper airway cough syndrome with flattening on f/v loop 10/13/17 c/w vcd 10/17/2017   Elevated diaphragm 10/13/2017   Ileus ([place]) 09/29/2017   QT prolongation 09/29/2017   Malnutrition of moderate degree 09/29/2017   Sinus congestion 09/03/2017   Symptomatic anemia 09/02/2017   Cirrhosis of liver with ascites ([place]) 09/02/2017   Left bundle branch block 09/02/2017   Mitral stenosis 09/02/2017   Hematochezia 07/15/2017   Wide-complex tachycardia ([place])    Endotracheally intubated    ESRD on dialysis ([place]) 07/04/2017   CKD (chronic kidney disease) stage V requiring chronic dialysis ([place]) 06/18/2017   History of Cocaine abuse ([place]) 06/18/2017   Hypertension 06/18/2017   Infection of AV graft for dialysis ([place]) 06/18/2017   Anxiety 06/18/2017   Anemia due to chronic kidney disease 06/18/2017   Atypical atrial flutter ([place]) 06/18/2017   Personality disorder ([place]) 06/13/2017   Cellulitis 06/12/2017   Adjustment disorder with mixed anxiety and depressed mood 06/10/2017   Suicidal ideation 06/10/2017   Arm wound, left, sequela 06/10/2017   Dyspnea on exertion 05/29/2017   Tachycardia 05/29/2017   Hyperkalemia 37/85/8850   Acute metabolic encephalopathy    Anemia 04/23/2017   Ascites 04/23/2017   COPD (chronic obstructive pulmonary disease) ([place]) 04/23/2017   Acute on chronic respiratory failure with hypoxia ([place]) 03/25/2017   Arrhythmia 03/25/2017   COPD GOLD 0 with flattening on inps f/v  09/27/2016   Essential hypertension 09/27/2016   Fluid overload 08/30/2016   COPD exacerbation ([place]) 08/17/2016   Hypertensive urgency 08/17/2016   Problem with dialysis access ([place]) 07/23/2016   Chronic hepatitis B ([place]) 03/05/2014   Chronic hepatitis C without hepatic coma ([place]) 03/05/2014   Internal hemorrhoids with bleeding, swelling and itching 03/05/2014    Thrombocytopenia ([place]) 03/05/2014   Chest pain 02/27/2014   Alcohol abuse 04/14/2009   Nicotine dependence, cigarettes, uncomplicated 27/74/1287   GANGLION CYST 04/14/2009    Past Surgical History:  Procedure Laterality Date   A/V FISTULAGRAM Left 05/26/2017   Procedure: A/V FISTULAGRAM;  Surgeon: Conrad Jenera, MD;  Location: [place] [facility];  Service: Cardiovascular;  Laterality: Left;   A/V FISTULAGRAM Right 11/18/2017   Procedure: A/V FISTULAGRAM - Right Arm;  Surgeon: Elam Dutch, MD;  Location: [place] [facility];  Service: Cardiovascular;  Laterality: Right;   AMPUTATION Right 09/04/2020   Procedure: RIGHT INDEX AND RIGHT RING FINGER AMPUTATION DIGIT;  Surgeon: Leanora Cover, MD;  Location: [place];  Service: Orthopedics;  Laterality: Right;   AMPUTATION Right 11/13/2020   Procedure: RIGHT INDEX FINGER AMPUTATION AND RIGHT RING FINGER AMPUTATION;  Surgeon: Leanora Cover, MD;  Location: [place];  Service: Orthopedics;  Laterality: Right;   APPLICATION OF WOUND VAC Left 06/14/2017   Procedure: APPLICATION OF WOUND VAC;  Surgeon: Katha Cabal, MD;  Location: [facility];  Service: Vascular;  Laterality: Left;   AV FISTULA PLACEMENT  2012   BELIEVED WAS PLACED IN JUNE   AV FISTULA PLACEMENT Right 08/09/2017   Procedure: Creation Right arm ARTERIOVENOUS BRACHIOCEPOHALIC FISTULA;  Surgeon: Elam Dutch, MD;  Location: [facility];  Service: Vascular;  Laterality: Right;   AV FISTULA PLACEMENT Right 11/22/2017   Procedure: INSERTION OF ARTERIOVENOUS (AV) GORE-TEX GRAFT RIGHT UPPER ARM;  Surgeon: Elam Dutch, MD;  Location: [place];  Service: Vascular;  Laterality: Right;   BIOPSY  01/25/2018   Procedure: BIOPSY;  Surgeon: Jerene Bears, MD;  Location: [place];  Service: Gastroenterology;;   BIOPSY  04/10/2019   Procedure: BIOPSY;  Surgeon: Jerene Bears, MD;  Location: [facility];  Service: Gastroenterology;;   COLONOSCOPY     COLONOSCOPY WITH PROPOFOL N/A 01/25/2018   Procedure:  COLONOSCOPY WITH PROPOFOL;  Surgeon: Jerene Bears, MD;  Location: [place];  Service: Gastroenterology;  Laterality: N/A;   CORONARY STENT INTERVENTION N/A 02/22/2017   Procedure: CORONARY STENT INTERVENTION;  Surgeon: Nigel Mormon, MD;  Location: [place] [facility];  Service: Cardiovascular;  Laterality: N/A;   ESOPHAGOGASTRODUODENOSCOPY (EGD) WITH PROPOFOL N/A 01/25/2018   Procedure: ESOPHAGOGASTRODUODENOSCOPY (EGD) WITH PROPOFOL;  Surgeon: Jerene Bears, MD;  Location: [place];  Service: Gastroenterology;  Laterality: N/A;   ESOPHAGOGASTRODUODENOSCOPY (EGD) WITH PROPOFOL N/A 04/10/2019   Procedure: ESOPHAGOGASTRODUODENOSCOPY (EGD) WITH PROPOFOL;  Surgeon: Jerene Bears, MD;  Location: [facility];  Service: Gastroenterology;  Laterality: N/A;   FLEXIBLE SIGMOIDOSCOPY N/A 07/15/2017   Procedure: FLEXIBLE SIGMOIDOSCOPY;  Surgeon: Carol Ada, MD;  Location: [place];  Service: Endoscopy;  Laterality: N/A;   HEMORRHOID BANDING     I & D EXTREMITY Left 06/01/2017   Procedure: IRRIGATION AND DEBRIDEMENT LEFT ARM HEMATOMA WITH LIGATION OF LEFT ARM AV FISTULA;  Surgeon: Elam Dutch, MD;  Location: [place];  Service: Vascular;  Laterality: Left;   I & D EXTREMITY Left 06/14/2017   Procedure: IRRIGATION AND DEBRIDEMENT EXTREMITY;  Surgeon: Katha Cabal, MD;  Location: [facility];  Service: Vascular;  Laterality: Left;   INSERTION OF DIALYSIS CATHETER  05/30/2017   INSERTION OF DIALYSIS CATHETER N/A 05/30/2017   Procedure: INSERTION OF DIALYSIS CATHETER;  Surgeon: Elam Dutch, MD;  Location: [place];  Service: Vascular;  Laterality: N/A;   IR PARACENTESIS  08/30/2017   IR PARACENTESIS  09/29/2017   IR PARACENTESIS  10/28/2017   IR PARACENTESIS  11/09/2017   IR PARACENTESIS  11/16/2017   IR PARACENTESIS  11/28/2017   IR PARACENTESIS  12/01/2017   IR PARACENTESIS  12/06/2017   IR PARACENTESIS  01/03/2018   IR PARACENTESIS  01/23/2018   IR PARACENTESIS  02/07/2018   IR  PARACENTESIS  02/21/2018   IR PARACENTESIS  03/06/2018   IR PARACENTESIS  03/17/2018   IR PARACENTESIS  04/04/2018   IR PARACENTESIS  12/28/2018   IR PARACENTESIS  01/08/2019   IR PARACENTESIS  01/23/2019   IR PARACENTESIS  02/01/2019   IR PARACENTESIS  02/19/2019   IR PARACENTESIS  03/01/2019   IR PARACENTESIS  03/15/2019   IR PARACENTESIS  04/03/2019   IR PARACENTESIS  04/12/2019   IR PARACENTESIS  05/01/2019   IR PARACENTESIS  05/08/2019   IR PARACENTESIS  05/24/2019   IR PARACENTESIS  06/12/2019   IR  PARACENTESIS  07/09/2019   IR PARACENTESIS  07/27/2019   IR PARACENTESIS  08/09/2019   IR PARACENTESIS  08/21/2019   IR PARACENTESIS  09/17/2019   IR PARACENTESIS  10/05/2019   IR PARACENTESIS  10/29/2019   IR PARACENTESIS  11/08/2019   IR PARACENTESIS  12/12/2019   IR PARACENTESIS  01/03/2020   IR PARACENTESIS  01/10/2020   IR PARACENTESIS  01/17/2020   IR PARACENTESIS  01/24/2020   IR PARACENTESIS  01/31/2020   IR PARACENTESIS  02/07/2020   IR PARACENTESIS  02/21/2020   IR PARACENTESIS  05/21/2020   IR PARACENTESIS  10/10/2020   IR PARACENTESIS  10/28/2020   IR PARACENTESIS  11/18/2020   IR PARACENTESIS  12/02/2020   IR PARACENTESIS  12/11/2020   IR PARACENTESIS  12/18/2020   IR PARACENTESIS  12/30/2020   IR PARACENTESIS  01/06/2021   IR PARACENTESIS  01/13/2021   IR PARACENTESIS  01/20/2021   IR PARACENTESIS  01/27/2021   IR PARACENTESIS  02/03/2021   IR PARACENTESIS  02/10/2021   IR PARACENTESIS  02/17/2021   IR PARACENTESIS  02/24/2021   IR RADIOLOGIST EVAL & MGMT  02/14/2018   IR RADIOLOGIST EVAL & MGMT  02/22/2019   LEFT HEART CATH AND CORONARY ANGIOGRAPHY N/A 02/22/2017   Procedure: LEFT HEART CATH AND CORONARY ANGIOGRAPHY;  Surgeon: Nigel Mormon, MD;  Location: [place] [facility];  Service: Cardiovascular;  Laterality: N/A;   LIGATION OF ARTERIOVENOUS  FISTULA Left 09/17/7562   Procedure: Plication of Left Arm Arteriovenous Fistula;  Surgeon: Elam Dutch, MD;  Location: [facility];  Service: Vascular;   Laterality: Left;   POLYPECTOMY     POLYPECTOMY  01/25/2018   Procedure: POLYPECTOMY;  Surgeon: Jerene Bears, MD;  Location: [place];  Service: Gastroenterology;;   REVISON OF ARTERIOVENOUS FISTULA Left 3/32/9518   Procedure: PLICATION OF DISTAL ANEURYSMAL SEGEMENT OF LEFT UPPER ARM ARTERIOVENOUS FISTULA;  Surgeon: Elam Dutch, MD;  Location: [place];  Service: Vascular;  Laterality: Left;   REVISON OF ARTERIOVENOUS FISTULA Left 8/41/6606   Procedure: Plication of Left Upper Arm Fistula ;  Surgeon: Waynetta Sandy, MD;  Location: [place];  Service: Vascular;  Laterality: Left;   SKIN GRAFT SPLIT THICKNESS LEG / FOOT Left    SKIN GRAFT SPLIT THICKNESS LEFT ARM DONOR SITE: LEFT ANTERIOR THIGH   SKIN SPLIT GRAFT Left 07/04/2017   Procedure: SKIN GRAFT SPLIT THICKNESS LEFT ARM DONOR SITE: LEFT ANTERIOR THIGH;  Surgeon: Elam Dutch, MD;  Location: [place];  Service: Vascular;  Laterality: Left;   THROMBECTOMY W/ EMBOLECTOMY Left 06/05/2017   Procedure: EXPLORATION OF LEFT ARM FOR BLEEDING; OVERSEWED PROXIMAL FISTULA;  Surgeon: Angelia Mould, MD;  Location: [place];  Service: Vascular;  Laterality: Left;   WOUND EXPLORATION Left 06/03/2017   Procedure: WOUND EXPLORATION WITH WOUND VAC APPLICATION TO LEFT ARM;  Surgeon: Angelia Mould, MD;  Location: [facility];  Service: Vascular;  Laterality: Left;       Family History  Problem Relation Age of Onset   Heart disease Mother    Lung cancer Mother    Heart disease Father    Malignant hyperthermia Father    COPD Father    Throat cancer Sister    Esophageal cancer Sister    Hypertension Other    COPD Other    Throat cancer Sister    Colon cancer Neg Hx    Colon polyps Neg Hx    Rectal cancer Neg Hx  Stomach cancer Neg Hx     Social History   Tobacco Use   Smoking status: Every Day    Packs/day: 0.50    Years: 43.00    Pack years: 21.50    Types: Cigarettes    Start date: 08/13/1973   Smokeless tobacco:  Never  Vaping Use   Vaping Use: Never used  Substance Use Topics   Alcohol use: Not Currently    Comment: quit drinking in 2017   Drug use: Yes    Types: Marijuana, Cocaine    Comment:  Quit Cocaine 2020, Last use Marijuana 11/08/20    Home Medications Prior to Admission medications   Medication Sig Start Date End Date Taking? Authorizing Provider  acetaminophen (TYLENOL) 500 MG tablet Take 500-1,000 mg by mouth every 6 (six) hours as needed (pain.).    [provider]  albuterol (VENTOLIN HFA) 108 (90 Base) MCG/ACT inhaler INHALE 2 PUFFS BY MOUTH EVERY 4 HOURS AS NEEDED FOR WHEEZE OR FOR SHORTNESS OF BREATH Patient taking differently: Inhale 2 puffs into the lungs every 4 (four) hours as needed for wheezing or shortness of breath. 02/06/21   Cantwell, Celeste C, PA-C  amiodarone (PACERONE) 200 MG tablet TAKE 1 TABLET BY MOUTH EVERY DAY Patient taking differently: Take 200 mg by mouth in the morning. 09/29/20   Cantwell, Celeste C, PA-C  cetirizine (ZYRTEC) 10 MG tablet Take 10 mg by mouth daily as needed for allergies. 06/23/20   [provider]  cinacalcet (SENSIPAR) 30 MG tablet TAKE 2 TABLETS (60 MG TOTAL) BY MOUTH EVERY MONDAY, WEDNESDAY, AND FRIDAY WITH HEMODIALYSIS. Patient taking differently: Take 60 mg by mouth See admin instructions. Monday,Wednesday and friday 09/12/20 09/12/21  Florencia Reasons, MD  diltiazem (CARDIZEM CD) 120 MG 24 hr capsule TAKE 1 CAPSULE BY MOUTH EVERY DAY Patient taking differently: Take 120 mg by mouth daily. 12/15/20   Adrian Prows, MD  doxycycline (VIBRAMYCIN) 100 MG capsule Take 100 mg by mouth 2 (two) times daily. 11/05/20   [provider]  gabapentin (NEURONTIN) 300 MG capsule TAKE 1 CAPSULE BY MOUTH IN THE MORNING AND AT BEDTIME Patient taking differently: Take 300 mg by mouth 2 (two) times daily. 11/14/20   Charlott Rakes, MD  lactulose (CHRONULAC) 10 GM/15ML solution Take 30 mLs (20 g total) by mouth daily. Patient taking differently: Take  20 g by mouth daily as needed (constipation). 09/12/20   Florencia Reasons, MD  lidocaine-prilocaine (EMLA) cream Apply 1 application topically every Monday, Wednesday, and Friday with hemodialysis. Applied sparingly for port access 11/01/20   [provider]  metoprolol succinate (TOPROL-XL) 25 MG 24 hr tablet Take 0.5 tablets (12.5 mg total) by mouth daily. 01/29/21   Cantwell, Celeste C, PA-C  ondansetron (ZOFRAN) 4 MG tablet TAKE 1 TABLET BY MOUTH EVERY 8 HOURS AS NEEDED FOR NAUSEA AND VOMITING Patient taking differently: Take 4 mg by mouth every 8 (eight) hours as needed for vomiting or nausea. 12/15/20   Charlott Rakes, MD  oxyCODONE (ROXICODONE) 5 MG immediate release tablet 1 tab PO q6 hours prn pain Patient not taking: Reported on 03/03/2021 11/13/20   Leanora Cover, MD  Oxycodone HCl 10 MG TABS TAKE 1 TABLET (10 MG TOTAL) BY MOUTH THREE TIMES DAILY AS NEEDED PAIN. Patient taking differently: Take 10 mg by mouth 3 (three) times daily as needed (pain). 09/12/20 03/11/21  Florencia Reasons, MD  pantoprazole (PROTONIX) 40 MG tablet Take 40 mg by mouth daily.    [provider]  polyethylene glycol (  MIRALAX / GLYCOLAX) 17 g packet Take 17 g by mouth daily as needed (constipation).    [provider]  sevelamer carbonate (RENVELA) 800 MG tablet Take 800 mg by mouth 3 (three) times daily with meals.    [provider]    Allergies    Tramadol, Grass extracts [gramineae pollens], Morphine and related, Pollen extract, Acetaminophen, Aspirin, and Clonidine derivatives  Review of Systems   Review of Systems  Unable to perform ROS: Mental status change   Physical Exam Updated Vital Signs BP (!) 118/95 (BP Location: Left Arm)   Pulse 82   Temp 98.4 F (36.9 C) (Oral)   Resp (!) 25   Ht 5\' 9"  (1.753 m)   Wt 68.9 kg   SpO2 92%   BMI 22.45 kg/m   Physical Exam Vitals and nursing note reviewed.  Constitutional:      General: He is not in acute distress.    Appearance: He is  well-developed.     Comments: Chronically ill-appearing male appears to be in no acute discomfort but does appear somnolent.  HENT:     Head: Atraumatic.  Eyes:     Conjunctiva/sclera: Conjunctivae normal.  Cardiovascular:     Rate and Rhythm: Tachycardia present. Rhythm irregular.     Heart sounds: Murmur heard.  Pulmonary:     Breath sounds: Rhonchi present. No wheezing.  Abdominal:     General: There is distension.  Musculoskeletal:     Cervical back: Neck supple.     Right lower leg: No edema.     Left lower leg: No edema.  Skin:    General: Skin is warm.     Findings: No rash.  Neurological:     Mental Status: He is disoriented.  Psychiatric:        Mood and Affect: Mood normal.    ED Results / Procedures / Treatments   Labs (all labs ordered are listed, but only abnormal results are displayed) Labs Reviewed  BASIC METABOLIC PANEL - Abnormal; Notable for the following components:      Result Value   Sodium 125 (*)    Potassium 7.1 (*)    Chloride 90 (*)    CO2 21 (*)    BUN 61 (*)    Creatinine, Ser 13.55 (*)    Calcium 7.5 (*)    GFR, Estimated 4 (*)    All other components within normal limits  CBC WITH DIFFERENTIAL/PLATELET - Abnormal; Notable for the following components:   WBC 12.2 (*)    RBC 3.84 (*)    Hemoglobin 10.5 (*)    HCT 30.7 (*)    MCV 79.9 (*)    Neutro Abs 9.9 (*)    All other components within normal limits  RESP PANEL BY RT-PCR (FLU A&B, COVID) [id number]  CBG MONITORING, ED    EKG EKG Interpretation  Date/Time:  Wednesday March 04 2021 13:36:09 EDT Ventricular Rate:  81 PR Interval:  150 QRS Duration: 191 QT Interval:  476 QTC Calculation: 553 R Axis:   -81 Text Interpretation: Sinus rhythm Ventricular premature complex Left bundle branch block since last tracing no significant change Confirmed by Daleen Bo [phone]) on 03/04/2021 2:08:48 PM  Radiology CT HEAD WO CONTRAST (5MM)  Result Date: 03/02/2021 CLINICAL DATA:   Altered level of consciousness, hypoglycemia EXAM: CT HEAD WITHOUT CONTRAST TECHNIQUE: Contiguous axial images were obtained from the base of the skull through the vertex without intravenous contrast. COMPARISON:  01/11/2017 FINDINGS: Brain: Chronic hypodensities are  seen bilaterally within the periventricular white matter and basal ganglia consistent with chronic small vessel ischemic changes. No signs of acute infarct or hemorrhage. The lateral ventricles and remaining midline structures are unremarkable. No acute extra-axial fluid collections. No mass effect. Vascular: Extensive atherosclerosis unchanged. No hyperdense vessel. Skull: Normal. Negative for fracture or focal lesion. Sinuses/Orbits: No acute finding. Other: None. IMPRESSION: 1. Chronic small vessel ischemic change as above. No acute intracranial process. Electronically Signed   By: Randa Ngo M.D.   On: 03/02/2021 23:03   DG Chest Portable 1 View  Result Date: 03/02/2021 CLINICAL DATA:  Altered mental status EXAM: PORTABLE CHEST 1 VIEW COMPARISON:  Chest radiograph 09/20/2020 FINDINGS: The heart is markedly enlarged the mediastinal contours are grossly within normal limits. There is calcified atherosclerotic plaque of the vasculature. There is vascular congestion without definite frank interstitial edema. Patchy opacities in the lung bases likely reflect subsegmental atelectasis. There is a trace right effusion. There is no definite left effusion. There is no appreciable pneumothorax. There is no acute osseous abnormality. IMPRESSION: 1. Bibasilar subsegmental atelectasis and vascular congestion but no definite frank interstitial edema. 2. Trace right pleural effusion. 3. Marked cardiomegaly. Electronically Signed   By: Valetta Mole M.D.   On: 03/02/2021 20:25    Procedures .Critical Care  Date/Time: 03/04/2021 4:12 PM Performed by: Domenic Moras, PA-C Authorized by: Domenic Moras, PA-C   Critical care provider statement:    Critical  care time (minutes):  50   Critical care was time spent personally by me on the following activities:  Discussions with consultants, evaluation of patient's response to treatment, examination of patient, ordering and performing treatments and interventions, ordering and review of laboratory studies, ordering and review of radiographic studies, pulse oximetry, re-evaluation of patient's condition, obtaining history from patient or surrogate and review of old charts   Medications Ordered in ED Medications - No data to display  ED Course  I have reviewed the triage vital signs and the nursing notes.  Pertinent labs & imaging results that were available during my care of the patient were reviewed by me and considered in my medical decision making (see chart for details).    MDM Rules/Calculators/A&P                         BP (!) 99/48   Pulse 67   Temp 98.4 F (36.9 C) (Oral)   Resp 18   Ht 5\' 9"  (1.753 m)   Wt 68.9 kg   SpO2 94%   BMI 22.45 kg/m    BP (!) 99/48   Pulse 67   Temp 98.4 F (36.9 C) (Oral)   Resp 18   Ht 5\' 9"  (1.753 m)   Wt 68.9 kg   SpO2 94%   BMI 22.45 kg/m   Final Clinical Impression(s) / ED Diagnoses Final diagnoses:  Hyperkalemia  Dialysis patient, noncompliant (HCC)  General weakness    Rx / DC Orders ED Discharge Orders     None      1:43 PM Patient with history of end-stage renal disease who has missed approximately 1 week of dialysis because "I just do not feel like going".  He was seen 2 days ago for generalized weakness and altered mental status and was found to be hypoglycemic as well as having elevated potassium of 6.7 with peaked T waves.  He was subsequently admitted to the hospital for dialyzing however he left AMA.  Was found today with  pinpoint pupils and increased confusion however mental status did improve after he received 1.2 mg of Narcan by EMS.  At this time he is protecting his airway, is drowsy but able to response to  questions.  He will need to be admitted for dialysis and further management of his chronic condition.  2:22 PM Patient is globally weak, drowsy, current blood pressure is 89/59.  He was 2 mm bilaterally and reactive.  Will check CBG.  Care discussed with Dr. Eulis Foster  CBG is 42, patient is responsive, able to communicate, will hold off on Narcan.  We will give a small bolus of 280mL of IV fluid as his blood pressure is low.  3:28 PM Patient's potassium is 7.1.  Along with EKG changes, patient will need emergent treatment as well as dialysis.  I have ordered appropriate treatment which include bicarb, calcium gluconate, insulin, albuterol and Lokelma.  Sodium is 125 likely pseudohyponatremia.  I have paged nephrology approximately an hour ago but have not heard back, will repeat again.  3:53 PM Appreciate consultation from nephrologist Dr. Burnett Sheng, who agrees that patient will need dialysis.  Will consult medicine for admission.  4:11 PM Appreciate consultation from [facility] Dr. Marianne Sofia who agrees to see and will admit pt.   Domenic Moras, PA-C 03/04/21 1613    Daleen Bo, MD 03/05/21 [phone]

## 2021-03-04 NOTE — ED Notes (Signed)
Pokhrel, MD aware of pts BP. Per MD, RN to monitor.

## 2021-03-04 NOTE — ED Triage Notes (Signed)
Pt BIB [facility] for generalized weakness for the last few days. Cannot ambulate normally due to weakness. Pt had been laying outside on the ground, denies pain. EMS reports pinpoint pupils and states pt was nodding off with RR dropping. Difficulty obtaining radial pulse and BP, pt has bilateral fistulas. Automatic read 78 systolic, averaging 90 systolic. Received 1.5 mg narcan IN. Pt more alert since. Pt received 250 mL NS. Pupils now 3 at this time. MWF dialysis pt. Has not been since last Wednesday. 88% on room air, placed on 3L. CBG 98.

## 2021-03-05 ENCOUNTER — Encounter (HOSPITAL_COMMUNITY): Payer: Self-pay | Admitting: Internal Medicine

## 2021-03-05 DIAGNOSIS — I10 Essential (primary) hypertension: Secondary | ICD-10-CM

## 2021-03-05 DIAGNOSIS — J432 Centrilobular emphysema: Secondary | ICD-10-CM

## 2021-03-05 DIAGNOSIS — G9340 Encephalopathy, unspecified: Secondary | ICD-10-CM

## 2021-03-05 DIAGNOSIS — R188 Other ascites: Secondary | ICD-10-CM

## 2021-03-05 DIAGNOSIS — K7460 Unspecified cirrhosis of liver: Secondary | ICD-10-CM

## 2021-03-05 DIAGNOSIS — I480 Paroxysmal atrial fibrillation: Secondary | ICD-10-CM

## 2021-03-05 LAB — BASIC METABOLIC PANEL
Anion gap: 12 (ref 5–15)
BUN: 27 mg/dL — ABNORMAL HIGH (ref 6–20)
CO2: 28 mmol/L (ref 22–32)
Calcium: 8 mg/dL — ABNORMAL LOW (ref 8.9–10.3)
Chloride: 94 mmol/L — ABNORMAL LOW (ref 98–111)
Creatinine, Ser: 7.89 mg/dL — ABNORMAL HIGH (ref 0.61–1.24)
GFR, Estimated: 7 mL/min — ABNORMAL LOW (ref >60)
Glucose, Bld: 132 mg/dL — ABNORMAL HIGH (ref 70–99)
Potassium: 4 mmol/L (ref 3.5–5.1)
Sodium: 134 mmol/L — ABNORMAL LOW (ref 135–145)

## 2021-03-05 LAB — CBC
HCT: 27.4 % — ABNORMAL LOW (ref 39.0–52.0)
Hemoglobin: 9.8 g/dL — ABNORMAL LOW (ref 13.0–17.0)
MCH: 27.5 pg (ref 26.0–34.0)
MCHC: 35.8 g/dL (ref 30.0–36.0)
MCV: 77 fL — ABNORMAL LOW (ref 80.0–100.0)
Platelets: 164 10*3/uL (ref 150–400)
RBC: 3.56 MIL/uL — ABNORMAL LOW (ref 4.22–5.81)
RDW: 13.6 % (ref 11.5–15.5)
WBC: 8.9 10*3/uL (ref 4.0–10.5)
nRBC: 0 % (ref 0.0–0.2)

## 2021-03-05 LAB — AMMONIA: Ammonia: 24 umol/L (ref 9–35)

## 2021-03-05 MED ORDER — LIDOCAINE-PRILOCAINE 2.5-2.5 % EX CREA
1.0000 "application " | TOPICAL_CREAM | CUTANEOUS | Status: DC
  Filled 2021-03-05: qty 5

## 2021-03-05 MED ORDER — OXYCODONE HCL 5 MG PO TABS
5.0000 mg | ORAL_TABLET | Freq: Four times a day (QID) | ORAL | Status: DC | PRN
Start: 2021-03-05 — End: 2021-03-06
  Administered 2021-03-05 – 2021-03-06 (×2): 5 mg via ORAL
  Filled 2021-03-05 (×3): qty 1

## 2021-03-05 MED ORDER — CINACALCET HCL 30 MG PO TABS
60.0000 mg | ORAL_TABLET | ORAL | Status: DC
  Administered 2021-03-06: 60 mg via ORAL
  Filled 2021-03-05: qty 2

## 2021-03-05 MED ORDER — ONDANSETRON HCL 4 MG PO TABS: 4.0000 mg | ORAL_TABLET | Freq: Three times a day (TID) | ORAL | Status: DC | PRN

## 2021-03-05 MED ORDER — GABAPENTIN 300 MG PO CAPS
300.0000 mg | ORAL_CAPSULE | Freq: Two times a day (BID) | ORAL | Status: DC
  Administered 2021-03-05 – 2021-03-07 (×4): 300 mg via ORAL
  Filled 2021-03-05 (×4): qty 1

## 2021-03-05 MED ORDER — MELATONIN 3 MG PO TABS
3.0000 mg | ORAL_TABLET | Freq: Every evening | ORAL | Status: DC | PRN
  Administered 2021-03-06 (×2): 3 mg via ORAL
  Filled 2021-03-05 (×2): qty 1

## 2021-03-05 NOTE — Progress Notes (Signed)
Pt still refusing tele monitor

## 2021-03-05 NOTE — Progress Notes (Signed)
Patient taken off the floor for HD. Patient has refused most his medications tonight as well as some labs.

## 2021-03-05 NOTE — Plan of Care (Signed)
  Problem: Education: Goal: Knowledge of disease or condition will improve Outcome: Not Progressing Goal: Understanding of medication regimen will improve Outcome: Not Progressing Goal: Individualized Educational Video(s) Outcome: Not Progressing   Problem: Activity: Goal: Ability to tolerate increased activity will improve Outcome: Not Progressing   Problem: Cardiac: Goal: Ability to achieve and maintain adequate cardiopulmonary perfusion will improve Outcome: Not Progressing   Problem: Health Behavior/Discharge Planning: Goal: Ability to safely manage health-related needs after discharge will improve Outcome: Not Progressing   Problem: Education: Goal: Knowledge of General Education information will improve Description: Including pain rating scale, medication(s)/side effects and non-pharmacologic comfort measures Outcome: Not Progressing   Problem: Clinical Measurements: Goal: Will remain free from infection Outcome: Not Progressing   Problem: Activity: Goal: Risk for activity intolerance will decrease Outcome: Not Progressing

## 2021-03-05 NOTE — Progress Notes (Signed)
[facility] Progress Note  Subjective: seen after HD this am, no c/o's.   Vitals:   03/05/21 0630 03/05/21 0700 03/05/21 0710 03/05/21 0820  BP: (!) 92/58 (!) 103/58 111/72 94/61  Pulse: 77 90 82 100  Resp:    18  Temp:   97.6 F (36.4 C) 98.4 F (36.9 C)  TempSrc:   Oral Oral  SpO2:   98% 93%  Weight:   77.6 kg   Height:        Exam: General: sitting in bed with no apparent distress HEENT: anicteric sclera, MMM CV: normal rate, no murmurs, trace edema in the bilateral ankles Lungs: bilateral chest rise, normal wob Abd: soft, non-tender, mild distention Skin: no visible lesions or rashes Psych: awake and alert, angry mood Neuro: normal speech, oriented to person and place but not time or situation        Home meds include amiodarone, sensipar , cardizem cd 120 qd, neurontin 300 bid, toprol xl 12.5 qd, oxycodone IR prn, protonix, renvela 800 tid ac, prns'    CXR -vasc congestion, no IS edema    OP HD: [facility]  4h  2/2 bath 450 bfr  69kg   aVG    - 1.5 ug calcitriol tiw     Assessment/ Plan: ESRD: plan for HD tonight for severe hyperkalemia. Missed HD x 3, last OP HD was 8/17.  Severe hyperkalemia - sp temporizing measures per ED providers. EKG w/ old LBBB. Got HD overnight, ordered repeat bmet.  Volume/ BP: BP's low in the 80's -90's, no fluid removed. Up 6-7kg if wts are correct. No resp issues. Chronic ascites complicates interpreting the dry wt. CXR w/o edema.  Anemia of Chronic Kidney Disease: Hemoglobin 10.3. No ESA MBD ckd: Phos 7.3. Continue home binders and calcitriol. Vascular access: AVG with no issues Hyponatremia:  due to chronic vol overload History of Hep B/C: caution on HD, isolation.  Dispo - if repeat K+ ok, pt OK for dc from renal standpoint. Needs to be compliant w/ OP HD.      Rob Khadija Thier 03/05/2021, 10:15 AM   Recent Labs  Lab 03/03/21 [phone] 03/04/21 1430 03/04/21 2128 03/05/21 0409  K 6.0* 7.1*  --   --   BUN 52* 61*   --   --   CREATININE 12.34* 13.55* 13.92*  --   CALCIUM 8.1* 7.5*  --   --   PHOS 7.3*  --   --   --   HGB  --  10.5* 10.2* 9.8*   Inpatient medications:  amiodarone  200 mg Oral Daily   Chlorhexidine Gluconate Cloth  6 each Topical Q0600   diltiazem  120 mg Oral Daily   docusate sodium  100 mg Oral BID   heparin  5,000 Units Subcutaneous Q8H   lactulose  20 g Oral Daily   metoprolol succinate  12.5 mg Oral Daily   nicotine  21 mg Transdermal Daily   pantoprazole  40 mg Oral Daily   sevelamer carbonate  800 mg Oral TID WC   sodium chloride flush  3 mL Intravenous Q12H    sodium chloride     sodium chloride, acetaminophen **OR** acetaminophen, hydrALAZINE, polyethylene glycol, sodium chloride flush

## 2021-03-05 NOTE — Progress Notes (Signed)
Pt attempted to walk out of the room and took a break at the sink to catch his breath.  Pt educated again to the risks of walking such a long distance with out staff assistance.  Pt states, "I'm going to stay here," pt ambulated back to bed with cane, refused tele leads, took his pants off for comfort and is sitting on the edge of the bed comfortably.   Continuing to monitor and assess

## 2021-03-05 NOTE — Progress Notes (Addendum)
Pt requests to leave AMA, Dr Louanne Belton notified in person and pts condition and request to leave AMA were discussed. Pt educated to the risks of leaving against medical advice.

## 2021-03-05 NOTE — Progress Notes (Addendum)
Pt has signed AMA form with an "x" and states that he cannot sign [place], pt son on speaker phone with pt at the time of signing.  Pts AMA request and "x" for a signature discussed with Midwife. Nurse Manager says an "x" is sufficient for consent to leave AMA.  IV removed from hand, pt cane given to pt.  Pt educated about nursing staff being unable to assist the pt out of the hospital, pt states he understands and "knows what he is doing."

## 2021-03-05 NOTE — Plan of Care (Signed)
  Problem: Education: Goal: Knowledge of General Education information will improve Description Including pain rating scale, medication(s)/side effects and non-pharmacologic comfort measures Outcome: Progressing   

## 2021-03-05 NOTE — Progress Notes (Signed)
PROGRESS NOTE  [name] DOB: 1961/06/12 DOA: 03/04/2021 PCP: Charlott Rakes, MD   LOS: 1 day   Brief narrative:  [name] is a 60 y.o. male with past medical history of end-stage renal disease on hemodialysis Monday, Wednesday Friday, cirrhosis of liver secondary to alcoholic liver disease, hepatitis B and C, COPD with cor pulmonale, atrial fibrillation, anemia and polysubstance abuse was brought into the hospital with complaints of generalized weakness. In the ED, patient was noted to be mildly hypotensive.    Patient was very somnolent and had pinpoint pupils so received 1.2 mg of Narcan as well.  Patient was little more alert after Narcan injection.  He was also hypoxic on room air so supplemental oxygen was placed in with 2 L of oxygen by nasal cannula.Marland Kitchen  He was afebrile.  Laboratory data was significant for potassium of 7.1, sodium of 125.  EKG showed some tall T waves.  Nephrology was consulted from the ED for urgent hemodialysis.  WBC was elevated at 12.2.  Medical team was consulted for admission to the hospital.  Assessment/Plan:  Principal Problem:   Hyperkalemia Active Problems:   COPD (chronic obstructive pulmonary disease) (HCC)   Hypertension   ESRD on dialysis (HCC)   Cirrhosis of liver with ascites (HCC)   AF (paroxysmal atrial fibrillation) (HCC)   Acute encephalopathy   Metabolic encephalopathy likely secondary to use of narcotics and missed hemodialysis with underlying liver disease.  Received Narcan in ED. improved encephalopathy at this time after receiving dialysis.  Continue lactulose  Hyperkalemia with EKG changes.  Patient received temporizing measures in the ED including D10 calcium gluconate and insulin.  Dispose hemodialysis with improvement in potassium.  Plan for repeat hemodialysis today as per nephrology.  Hyponatremia.  Secondary to volume overload.  Slightly improved after hemodialysis.  End-stage renal disease  on hemodialysis.  Missed dialysis sessions for a week.  Plan for hemodialysis as per nephrology   History of atrial fibrillation on amiodarone and metoprolol rate control.   COPD with cor pulmonale.  We will continue with nebulizer.  Currently   Anemia of chronic kidney disease.  We will follow CBC.   History of ischemia to the hand status post right index and ring finger amputation.  Continues to smoke.  Continue nicotine patch   History polysubstance abuse Reported that he is not using cocaine or alcohol anymore.  He however continues to smoke.  Continue nicotine patch.  Counseled about it   Chronic pain.  Patient is on oxycodone and Neurontin at home.  Resume at this time to prevent withdrawal.  Resume at a lower dose  DVT prophylaxis: heparin injection 5,000 Units Start: 03/04/21 2200    Code Status: Full code  Family Communication: None  Status is: Inpatient  Remains inpatient appropriate because:IV treatments appropriate due to intensity of illness or inability to take PO and Inpatient level of care appropriate due to severity of illness  Dispo: The patient is from: Home              Anticipated d/c is to: Home              Patient currently is not medically stable to d/c.   Difficult to place patient No       Consultants: Nephrology  Procedures: Hemodialysis  Anti-infectives:  None  Anti-infectives (From admission, onward)    None        Subjective: Today, patient was seen and examined at bedside.  Denies  any chest pain, dizziness, lightheadedness but has shortness of breath which is better than yesterday.  Objective: Vitals:   03/05/21 0820 03/05/21 1154  BP: 94/61 112/67  Pulse: 100 100  Resp: 18 18  Temp: 98.4 F (36.9 C) 98 F (36.7 C)  SpO2: 93% 93%    Intake/Output Summary (Last 24 hours) at 03/05/2021 1500 Last data filed at 03/05/2021 1000 Gross per 24 hour  Intake 240 ml  Output 0 ml  Net 240 ml   Filed Weights   03/04/21 2000  03/05/21 0340 03/05/21 0710  Weight: 77.3 kg 77.6 kg 77.6 kg   Body mass index is 25.26 kg/m.   Physical Exam: GENERAL: Patient is alert awake and communicative, not in obvious distress. HENT: No scleral pallor or icterus. Pupils equally reactive to light. Oral mucosa is moist NECK: is supple, no gross swelling noted. CHEST: Coarse breath sounds noted bilaterally, decreased breath sounds. CVS: S1 and S2 heard, no murmur. Regular rate and rhythm.  ABDOMEN: Soft, non-tender, bowel sounds are present. EXTREMITIES: No edema. CNS: Cranial nerves are intact. No focal motor deficits. SKIN: warm and dry without rashes.  Data Review: I have personally reviewed the following laboratory data and studies,  CBC: Recent Labs  Lab 03/02/21 1948 03/02/21 2007 03/03/21 0307 03/04/21 1430 03/04/21 2128 03/05/21 0409  WBC 7.5  --  6.9 12.2* 10.3 8.9  NEUTROABS 5.8  --   --  9.9*  --   --   HGB 10.3* 9.9* 10.3* 10.5* 10.2* 9.8*  HCT 29.3* 29.0* 29.3* 30.7* 28.7* 27.4*  MCV 78.6*  --  77.5* 79.9* 77.6* 77.0*  PLT 187  --  178 173 168 373   Basic Metabolic Panel: Recent Labs  Lab 03/02/21 1948 03/02/21 2007 03/03/21 0307 03/03/21 0638 03/04/21 1430 03/04/21 2128 03/05/21 1031  NA 128* 126*  --  124* 125*  --  134*  K 6.7* 6.5*  --  6.0* 7.1*  --  4.0  CL 88* 91*  --  85* 90*  --  94*  CO2 23  --   --  25 21*  --  28  GLUCOSE 59* 52*  --  182* 89  --  132*  BUN 54* 47*  --  52* 61*  --  27*  CREATININE 12.50* 12.40* 12.52* 12.34* 13.55* 13.92* 7.89*  CALCIUM 8.3*  --   --  8.1* 7.5*  --  8.0*  PHOS  --   --   --  7.3*  --   --   --    Liver Function Tests: Recent Labs  Lab 03/02/21 1948 03/03/21 0638  AST 52*  --   ALT 17  --   ALKPHOS 183*  --   BILITOT 1.1  --   PROT 5.8*  --   ALBUMIN 2.1* 2.1*   No results for input(s): LIPASE, AMYLASE in the last 168 hours. Recent Labs  Lab 03/03/21 0307 03/05/21 0408  AMMONIA 23 24   Cardiac Enzymes: No results for input(s):  CKTOTAL, CKMB, CKMBINDEX, TROPONINI in the last 168 hours. BNP (last 3 results) No results for input(s): BNP in the last 8760 hours.  ProBNP (last 3 results) No results for input(s): PROBNP in the last 8760 hours.  CBG: Recent Labs  Lab 03/03/21 0344 03/03/21 0551 03/03/21 0958 03/03/21 1220 03/04/21 1431  GLUCAP 117* 109* 80 96 87   Recent Results (from the past 240 hour(s))  Resp Panel by RT-PCR (Flu A&B, Covid) Nasopharyngeal Swab  Status: None   Collection Time: 03/03/21 12:32 AM   Specimen: Nasopharyngeal Swab; Nasopharyngeal(NP) swabs in vial transport medium  Result Value Ref Range Status   SARS Coronavirus 2 by RT PCR NEGATIVE NEGATIVE Final    Comment: (NOTE) SARS-CoV-2 target nucleic acids are NOT DETECTED.  The SARS-CoV-2 RNA is generally detectable in upper respiratory specimens during the acute phase of infection. The lowest concentration of SARS-CoV-2 viral copies this assay can detect is 138 copies/mL. A negative result does not preclude SARS-Cov-2 infection and should not be used as the sole basis for treatment or other patient management decisions. A negative result may occur with  improper specimen collection/handling, submission of specimen other than nasopharyngeal swab, presence of viral mutation(s) within the areas targeted by this assay, and inadequate number of viral copies(<138 copies/mL). A negative result must be combined with clinical observations, patient history, and epidemiological information. The expected result is Negative.  Fact Sheet for Patients:  EntrepreneurPulse.com.au  Fact Sheet for Healthcare Providers:  IncredibleEmployment.be  This test is no t yet approved or cleared by the [place] FDA and  has been authorized for detection and/or diagnosis of SARS-CoV-2 by FDA under an Emergency Use Authorization (EUA). This EUA will remain  in effect (meaning this test can be used) for the  duration of the COVID-19 declaration under Section 564(b)(1) of the Act, 21 U.S.C.section 360bbb-3(b)(1), unless the authorization is terminated  or revoked sooner.       Influenza A by PCR NEGATIVE NEGATIVE Final   Influenza B by PCR NEGATIVE NEGATIVE Final    Comment: (NOTE) The Xpert Xpress SARS-CoV-2/FLU/RSV plus assay is intended as an aid in the diagnosis of influenza from Nasopharyngeal swab specimens and should not be used as a sole basis for treatment. Nasal washings and aspirates are unacceptable for Xpert Xpress SARS-CoV-2/FLU/RSV testing.  Fact Sheet for Patients: EntrepreneurPulse.com.au  Fact Sheet for Healthcare Providers: IncredibleEmployment.be  This test is not yet approved or cleared by the [place] FDA and has been authorized for detection and/or diagnosis of SARS-CoV-2 by FDA under an Emergency Use Authorization (EUA). This EUA will remain in effect (meaning this test can be used) for the duration of the COVID-19 declaration under Section 564(b)(1) of the Act, 21 U.S.C. section 360bbb-3(b)(1), unless the authorization is terminated or revoked.  Performed at [facility], [place] [address] [place], [place] [address]   Resp Panel by RT-PCR (Flu A&B, Covid) Nasopharyngeal Swab     Status: None   Collection Time: 03/04/21  4:32 PM   Specimen: Nasopharyngeal Swab; Nasopharyngeal(NP) swabs in vial transport medium  Result Value Ref Range Status   SARS Coronavirus 2 by RT PCR NEGATIVE NEGATIVE Final    Comment: (NOTE) SARS-CoV-2 target nucleic acids are NOT DETECTED.  The SARS-CoV-2 RNA is generally detectable in upper respiratory specimens during the acute phase of infection. The lowest concentration of SARS-CoV-2 viral copies this assay can detect is 138 copies/mL. A negative result does not preclude SARS-Cov-2 infection and should not be used as the sole basis for treatment or other patient management  decisions. A negative result may occur with  improper specimen collection/handling, submission of specimen other than nasopharyngeal swab, presence of viral mutation(s) within the areas targeted by this assay, and inadequate number of viral copies(<138 copies/mL). A negative result must be combined with clinical observations, patient history, and epidemiological information. The expected result is Negative.  Fact Sheet for Patients:  EntrepreneurPulse.com.au  Fact Sheet for Healthcare Providers:  IncredibleEmployment.be  This test is no t yet approved or cleared by the [place] and  has been authorized for detection and/or diagnosis of SARS-CoV-2 by FDA under an Emergency Use Authorization (EUA). This EUA will remain  in effect (meaning this test can be used) for the duration of the COVID-19 declaration under Section 564(b)(1) of the Act, 21 U.S.C.section 360bbb-3(b)(1), unless the authorization is terminated  or revoked sooner.       Influenza A by PCR NEGATIVE NEGATIVE Final   Influenza B by PCR NEGATIVE NEGATIVE Final    Comment: (NOTE) The Xpert Xpress SARS-CoV-2/FLU/RSV plus assay is intended as an aid in the diagnosis of influenza from Nasopharyngeal swab specimens and should not be used as a sole basis for treatment. Nasal washings and aspirates are unacceptable for Xpert Xpress SARS-CoV-2/FLU/RSV testing.  Fact Sheet for Patients: EntrepreneurPulse.com.au  Fact Sheet for Healthcare Providers: IncredibleEmployment.be  This test is not yet approved or cleared by the [place] FDA and has been authorized for detection and/or diagnosis of SARS-CoV-2 by FDA under an Emergency Use Authorization (EUA). This EUA will remain in effect (meaning this test can be used) for the duration of the COVID-19 declaration under Section 564(b)(1) of the Act, 21 U.S.C. section 360bbb-3(b)(1), unless the  authorization is terminated or revoked.  Performed at [facility], [place] [address] [place], [place] [address]      Studies: No results found.    Flora Lipps, MD  [facility] 03/05/2021  If 7PM-7AM, please contact night-coverage

## 2021-03-06 LAB — BASIC METABOLIC PANEL
Anion gap: 13 (ref 5–15)
BUN: 26 mg/dL — ABNORMAL HIGH (ref 6–20)
CO2: 27 mmol/L (ref 22–32)
Calcium: 8.2 mg/dL — ABNORMAL LOW (ref 8.9–10.3)
Chloride: 92 mmol/L — ABNORMAL LOW (ref 98–111)
Creatinine, Ser: 8.68 mg/dL — ABNORMAL HIGH (ref 0.61–1.24)
GFR, Estimated: 7 mL/min — ABNORMAL LOW (ref >60)
Glucose, Bld: 103 mg/dL — ABNORMAL HIGH (ref 70–99)
Potassium: 4.7 mmol/L (ref 3.5–5.1)
Sodium: 132 mmol/L — ABNORMAL LOW (ref 135–145)

## 2021-03-06 LAB — CBC
HCT: 27.7 % — ABNORMAL LOW (ref 39.0–52.0)
Hemoglobin: 9.7 g/dL — ABNORMAL LOW (ref 13.0–17.0)
MCH: 27.3 pg (ref 26.0–34.0)
MCHC: 35 g/dL (ref 30.0–36.0)
MCV: 78 fL — ABNORMAL LOW (ref 80.0–100.0)
Platelets: 165 10*3/uL (ref 150–400)
RBC: 3.55 MIL/uL — ABNORMAL LOW (ref 4.22–5.81)
RDW: 13.8 % (ref 11.5–15.5)
WBC: 8 10*3/uL (ref 4.0–10.5)
nRBC: 0 % (ref 0.0–0.2)

## 2021-03-06 MED ORDER — IPRATROPIUM-ALBUTEROL 0.5-2.5 (3) MG/3ML IN SOLN
3.0000 mL | RESPIRATORY_TRACT | Status: DC | PRN
  Administered 2021-03-06: 3 mL via RESPIRATORY_TRACT
  Filled 2021-03-06: qty 3

## 2021-03-06 MED ORDER — METHYLPREDNISOLONE SODIUM SUCC 40 MG IJ SOLR
40.0000 mg | Freq: Once | INTRAMUSCULAR | Status: DC
  Filled 2021-03-06: qty 1

## 2021-03-06 MED ORDER — METHYLPREDNISOLONE SODIUM SUCC 40 MG IJ SOLR
40.0000 mg | Freq: Once | INTRAMUSCULAR | Status: AC
  Administered 2021-03-06: 40 mg via INTRAVENOUS
  Filled 2021-03-06: qty 1

## 2021-03-06 MED ORDER — OXYCODONE HCL 5 MG PO TABS
10.0000 mg | ORAL_TABLET | Freq: Four times a day (QID) | ORAL | Status: DC | PRN
  Administered 2021-03-06 – 2021-03-07 (×3): 10 mg via ORAL
  Filled 2021-03-06 (×5): qty 2

## 2021-03-06 MED ORDER — DILTIAZEM HCL ER COATED BEADS 120 MG PO CP24
120.0000 mg | ORAL_CAPSULE | Freq: Every evening | ORAL | Status: DC
  Administered 2021-03-06: 120 mg via ORAL
  Filled 2021-03-06: qty 1

## 2021-03-06 NOTE — Progress Notes (Addendum)
PROGRESS NOTE  [name] YIR:[id number] DOB: [birth year] DOA: 03/04/2021 PCP: Charlott Rakes, MD   LOS: 2 days   Brief narrative: [name] is a 60 y.o. male with past medical history of end-stage renal disease on hemodialysis Monday, Wednesday Friday, cirrhosis of liver secondary to alcoholic liver disease, hepatitis B and C, COPD with cor pulmonale, atrial fibrillation, anemia and polysubstance abuse was brought into the hospital with complaints of generalized weakness. In the ED, patient was noted to be mildly hypotensive.    Patient was very somnolent and had pinpoint pupils so received 1.2 mg of Narcan as well.  Patient was little more alert after Narcan injection.  He was also hypoxic on room air so supplemental oxygen was placed in with 2 L of oxygen by nasal cannula.Marland Kitchen  He was afebrile.  Laboratory data was significant for potassium of 7.1, sodium of 125.  EKG showed some tall T waves.  Nephrology was consulted from the ED for urgent hemodialysis.  WBC was elevated at 12.2.  Patient was then admitted hospital for further evaluation and treatment.  Assessment/Plan:  Principal Problem:   Hyperkalemia Active Problems:   COPD (chronic obstructive pulmonary disease) (HCC)   Hypertension   ESRD on dialysis ([place])   Cirrhosis of liver with ascites (HCC)   AF (paroxysmal atrial fibrillation) (HCC)   Acute encephalopathy   Metabolic encephalopathy resolved.  Likely secondary to use of narcotics and missed hemodialysis with underlying liver disease.  Received Narcan in ED. improved encephalopathy at this time after receiving dialysis.  Continue lactulose  Hyperkalemia with EKG changes.  Patient initially received temporizing measures in the ED including D10 calcium gluconate and insulin.  Continue hemodialysis.  Plan for hemodialysis tomorrow.  Hyponatremia.  Secondary to volume overload.  improved after hemodialysis.  End-stage renal disease on hemodialysis.  Missed  dialysis sessions for a week.  Plan for hemodialysis as per nephrology, next to hemodialysis and tomorrow.   History of atrial fibrillation on amiodarone and metoprolol,rate controlled   COPD with cor pulmonale.  Continue bronchodilators, supportive care.  Patient counseled against tobacco abuse.  We will give 1 dose of IV Solu-Medrol.  Currently on supplemental oxygen.  We will continue to wean.   Anemia of chronic kidney disease.  We will follow CBC.  Latest hemoglobin of 9.7   History of ischemia to the hand status post right index and ring finger amputation.  Continues to smoke.  Continue nicotine patch   History polysubstance abuse Reported that he is not using cocaine or alcohol anymore.  He however continues to smoke.  Continue nicotine patch.     Chronic pain.  Patient is on oxycodone and Neurontin at home.  Resumed. Oxycodone dose has been changed to 10 mg.   Generalized weakness, possible fall.  We will get PT evaluation.  DVT prophylaxis: heparin injection 5,000 Units Start: 03/04/21 2200   Code Status: Full code  Family Communication: Spoke with the patient's sister on the phone and updated her about the clinical condition of the patient.  Status is: Inpatient  Remains inpatient appropriate because:IV treatments appropriate due to intensity of illness or inability to take PO and Inpatient level of care appropriate due to severity of illness  Dispo: The patient is from: Home              Anticipated d/c is to: Home              Patient currently is not medically stable to d/c.  Difficult to place patient No    Consultants: Nephrology  Procedures: Hemodialysis  Anti-infectives:  None  Anti-infectives (From admission, onward)    None      Subjective: Patient was seen and examined at bedside.  Patient is reluctant to get out of bed and feels like short of breath and weak with generalized pain and requesting pain medication.  Still on supplemental  oxygen.  Objective: Vitals:   03/06/21 1421 03/06/21 1430  BP: 98/68 92/64  Pulse:    Resp:    Temp:    SpO2:      Intake/Output Summary (Last 24 hours) at 03/06/2021 1453 Last data filed at 03/06/2021 1301 Gross per 24 hour  Intake 1120 ml  Output 0 ml  Net 1120 ml    Filed Weights   03/05/21 0340 03/05/21 0710 03/06/21 0352  Weight: 77.6 kg 77.6 kg 76.1 kg   Body mass index is 24.76 kg/m.   Physical Exam:  GENERAL: Patient is alert awake and communicative, not in obvious distress.  On nasal cannula oxygen. HENT: No scleral pallor or icterus. Pupils equally reactive to light. Oral mucosa is moist NECK: is supple, no gross swelling noted. CHEST:Sounds noted bilaterally, decreased breath sounds. CVS: S1 and S2 heard, no murmur. Regular rate and rhythm.  ABDOMEN: Soft, non-tender, bowel sounds are present. EXTREMITIES: No edema. CNS: Cranial nerves are intact. No focal motor deficits. SKIN: warm and dry without rashes.  Data Review: I have personally reviewed the following laboratory data and studies,  CBC: Recent Labs  Lab 03/02/21 1948 03/02/21 2007 03/03/21 0307 03/04/21 1430 03/04/21 2128 03/05/21 0409 03/06/21 0340  WBC 7.5  --  6.9 12.2* 10.3 8.9 8.0  NEUTROABS 5.8  --   --  9.9*  --   --   --   HGB 10.3*   < > 10.3* 10.5* 10.2* 9.8* 9.7*  HCT 29.3*   < > 29.3* 30.7* 28.7* 27.4* 27.7*  MCV 78.6*  --  77.5* 79.9* 77.6* 77.0* 78.0*  PLT 187  --  178 173 168 164 165   < > = values in this interval not displayed.    Basic Metabolic Panel: Recent Labs  Lab 03/02/21 1948 03/02/21 2007 03/03/21 0307 03/03/21 0638 03/04/21 1430 03/04/21 2128 03/05/21 1031 03/06/21 0340  NA 128* 126*  --  124* 125*  --  134* 132*  K 6.7* 6.5*  --  6.0* 7.1*  --  4.0 4.7  CL 88* 91*  --  85* 90*  --  94* 92*  CO2 23  --   --  25 21*  --  28 27  GLUCOSE 59* 52*  --  182* 89  --  132* 103*  BUN 54* 47*  --  52* 61*  --  27* 26*  CREATININE 12.50* 12.40*   < > 12.34*  13.55* 13.92* 7.89* 8.68*  CALCIUM 8.3*  --   --  8.1* 7.5*  --  8.0* 8.2*  PHOS  --   --   --  7.3*  --   --   --   --    < > = values in this interval not displayed.    Liver Function Tests: Recent Labs  Lab 03/02/21 1948 03/03/21 0638  AST 52*  --   ALT 17  --   ALKPHOS 183*  --   BILITOT 1.1  --   PROT 5.8*  --   ALBUMIN 2.1* 2.1*    No results for input(s): LIPASE, AMYLASE  in the last 168 hours. Recent Labs  Lab 03/03/21 0307 03/05/21 0408  AMMONIA 23 24    Cardiac Enzymes: No results for input(s): CKTOTAL, CKMB, CKMBINDEX, TROPONINI in the last 168 hours. BNP (last 3 results) No results for input(s): BNP in the last 8760 hours.  ProBNP (last 3 results) No results for input(s): PROBNP in the last 8760 hours.  CBG: Recent Labs  Lab 03/03/21 0344 03/03/21 0551 03/03/21 0958 03/03/21 1220 03/04/21 1431  GLUCAP 117* 109* 80 96 87    Recent Results (from the past 240 hour(s))  Resp Panel by RT-PCR (Flu A&B, Covid) Nasopharyngeal Swab     Status: None   Collection Time: 03/03/21 12:32 AM   Specimen: Nasopharyngeal Swab; Nasopharyngeal(NP) swabs in vial transport medium  Result Value Ref Range Status   SARS Coronavirus 2 by RT PCR NEGATIVE NEGATIVE Final    Comment: (NOTE) SARS-CoV-2 target nucleic acids are NOT DETECTED.  The SARS-CoV-2 RNA is generally detectable in upper respiratory specimens during the acute phase of infection. The lowest concentration of SARS-CoV-2 viral copies this assay can detect is 138 copies/mL. A negative result does not preclude SARS-Cov-2 infection and should not be used as the sole basis for treatment or other patient management decisions. A negative result may occur with  improper specimen collection/handling, submission of specimen other than nasopharyngeal swab, presence of viral mutation(s) within the areas targeted by this assay, and inadequate number of viral copies(<138 copies/mL). A negative result must be combined  with clinical observations, patient history, and epidemiological information. The expected result is Negative.  Fact Sheet for Patients:  EntrepreneurPulse.com.au  Fact Sheet for Healthcare Providers:  IncredibleEmployment.be  This test is no t yet approved or cleared by the [place] FDA and  has been authorized for detection and/or diagnosis of SARS-CoV-2 by FDA under an Emergency Use Authorization (EUA). This EUA will remain  in effect (meaning this test can be used) for the duration of the COVID-19 declaration under Section 564(b)(1) of the Act, 21 U.S.C.section 360bbb-3(b)(1), unless the authorization is terminated  or revoked sooner.       Influenza A by PCR NEGATIVE NEGATIVE Final   Influenza B by PCR NEGATIVE NEGATIVE Final    Comment: (NOTE) The Xpert Xpress SARS-CoV-2/FLU/RSV plus assay is intended as an aid in the diagnosis of influenza from Nasopharyngeal swab specimens and should not be used as a sole basis for treatment. Nasal washings and aspirates are unacceptable for Xpert Xpress SARS-CoV-2/FLU/RSV testing.  Fact Sheet for Patients: EntrepreneurPulse.com.au  Fact Sheet for Healthcare Providers: IncredibleEmployment.be  This test is not yet approved or cleared by the [place] FDA and has been authorized for detection and/or diagnosis of SARS-CoV-2 by FDA under an Emergency Use Authorization (EUA). This EUA will remain in effect (meaning this test can be used) for the duration of the COVID-19 declaration under Section 564(b)(1) of the Act, 21 U.S.C. section 360bbb-3(b)(1), unless the authorization is terminated or revoked.  Performed at [facility], [place] [address], [place], [place] [address]   Resp Panel by RT-PCR (Flu A&B, Covid) Nasopharyngeal Swab     Status: None   Collection Time: 03/04/21  4:32 PM   Specimen: Nasopharyngeal Swab; Nasopharyngeal(NP) swabs in vial  transport medium  Result Value Ref Range Status   SARS Coronavirus 2 by RT PCR NEGATIVE NEGATIVE Final    Comment: (NOTE) SARS-CoV-2 target nucleic acids are NOT DETECTED.  The SARS-CoV-2 RNA is generally detectable in upper respiratory specimens during the  acute phase of infection. The lowest concentration of SARS-CoV-2 viral copies this assay can detect is 138 copies/mL. A negative result does not preclude SARS-Cov-2 infection and should not be used as the sole basis for treatment or other patient management decisions. A negative result may occur with  improper specimen collection/handling, submission of specimen other than nasopharyngeal swab, presence of viral mutation(s) within the areas targeted by this assay, and inadequate number of viral copies(<138 copies/mL). A negative result must be combined with clinical observations, patient history, and epidemiological information. The expected result is Negative.  Fact Sheet for Patients:  EntrepreneurPulse.com.au  Fact Sheet for Healthcare Providers:  IncredibleEmployment.be  This test is no t yet approved or cleared by the [place] FDA and  has been authorized for detection and/or diagnosis of SARS-CoV-2 by FDA under an Emergency Use Authorization (EUA). This EUA will remain  in effect (meaning this test can be used) for the duration of the COVID-19 declaration under Section 564(b)(1) of the Act, 21 U.S.C.section 360bbb-3(b)(1), unless the authorization is terminated  or revoked sooner.       Influenza A by PCR NEGATIVE NEGATIVE Final   Influenza B by PCR NEGATIVE NEGATIVE Final    Comment: (NOTE) The Xpert Xpress SARS-CoV-2/FLU/RSV plus assay is intended as an aid in the diagnosis of influenza from Nasopharyngeal swab specimens and should not be used as a sole basis for treatment. Nasal washings and aspirates are unacceptable for Xpert Xpress SARS-CoV-2/FLU/RSV testing.  Fact  Sheet for Patients: EntrepreneurPulse.com.au  Fact Sheet for Healthcare Providers: IncredibleEmployment.be  This test is not yet approved or cleared by the [place] FDA and has been authorized for detection and/or diagnosis of SARS-CoV-2 by FDA under an Emergency Use Authorization (EUA). This EUA will remain in effect (meaning this test can be used) for the duration of the COVID-19 declaration under Section 564(b)(1) of the Act, 21 U.S.C. section 360bbb-3(b)(1), unless the authorization is terminated or revoked.  Performed at [facility], [place] [address] [place], [place] [address]       Studies: No results found.    Flora Lipps, MD  [facility] 03/06/2021  If 7PM-7AM, please contact night-coverage

## 2021-03-06 NOTE — Progress Notes (Signed)
Pt just had an PIV inserted close to shift change. Upon flushing to give solumedrol, pt complains burning sensation and adamant to be pulled it out. PIV site no noted inflammation, no blood return and flushes ok.   Pt agreed to have  a new PIV insertion to give Solu medrol IV. Successfully placed PIV and Meds given. Just few min. Pt frequently asking to have Dilaudid Pain meds,  And advised that he don't have an order, And will be unlikely for DR will give as he can have OXY PO.Suddenly pt wants the new PIV out, complaining pain on the site. PIV was removed. As of now, NO IV access.

## 2021-03-06 NOTE — Progress Notes (Addendum)
Patient complains about phantom pain. Patient has oxy IR 5mg  in [facility] but the patient states that he does not take that at home. Patient PTA list shows he takes oxy Hcl 10mg  at home. Paged MD to notify. Patient has refused tele box monitoring at shift change and has continue to refused cardiac monitoring. Patient educated and made aware. MD is aware of the patient brady heart rate. RN has cycled BP Q76mins to monitor soft BP and soft HR.

## 2021-03-06 NOTE — Plan of Care (Signed)
  Problem: Education: Goal: Knowledge of disease or condition will improve Outcome: Progressing Goal: Understanding of medication regimen will improve Outcome: Progressing   Problem: Activity: Goal: Ability to tolerate increased activity will improve Outcome: Progressing   

## 2021-03-06 NOTE — Progress Notes (Signed)
[facility] Progress Note  Subjective: seen in room, no new c/o, in a better mood  Vitals:   03/06/21 1056 03/06/21 1100 03/06/21 1147 03/06/21 1157  BP: 108/62 117/72    Pulse: (!) 107     Resp: 18     Temp: 99.1 F (37.3 C)     TempSrc: Oral     SpO2: 98% 98% 93% 96%  Weight:      Height:        Exam: General: sitting in bed with no apparent distress HEENT: anicteric sclera, MMM CV: normal rate, no murmurs, trace edema in the bilateral ankles Lungs: bilateral chest rise, normal wob Abd: soft, non-tender, mild distention Skin: no visible lesions or rashes Psych: awake and alert, angry mood Neuro: normal speech, oriented to person and place but not time or situation        Home meds include amiodarone, sensipar , cardizem cd 120 qd, neurontin 300 bid, toprol xl 12.5 qd, oxycodone IR prn, protonix, renvela 800 tid ac, prns'    CXR -vasc congestion, no IS edema    OP HD: [facility]  4h  2/2 bath 450 bfr  69kg   aVG    - 1.5 ug calcitriol tiw     Assessment/ Plan: ESRD: had HD early Friday am. Will need another HD tomorrow off schedule, lower vol further if BP tolerates. Should be okay for dc after HD tomorrow am.  Severe hyperkalemia - sp temporizing measures per ED providers. EKG w/ old LBBB. Got HD and K+ is wnl.  Volume/ BP: BP's low in the 80's -90's, no fluid removed. Up 4-5kg, UF what BP's will tolerate. No resp issues. Chronic ascites complicates interpreting the dry wt. CXR w/o edema.  Chronic ascites/ cirrhosis - gets weekly LVP on Tuesday's, last done this past Tuesday per the pt Anemia of Chronic Kidney Disease: Hemoglobin 10.3. No ESA MBD ckd: Phos 7.3. Continue home binders and calcitriol. Vascular access: AVG with no issues Hyponatremia:  due to chronic vol overload History of Hep B/C: caution on HD, isolation.  Dispo - as above, hopefully tomorrow after HD     [name] 03/06/2021, 12:03 PM   Recent Labs  Lab 03/03/21 0638  03/04/21 1430 03/05/21 0409 03/05/21 1031 03/06/21 0340  K 6.0*   < >  --  4.0 4.7  BUN 52*   < >  --  27* 26*  CREATININE 12.34*   < >  --  7.89* 8.68*  CALCIUM 8.1*   < >  --  8.0* 8.2*  PHOS 7.3*  --   --   --   --   HGB  --    < > 9.8*  --  9.7*   < > = values in this interval not displayed.    Inpatient medications:  amiodarone  200 mg Oral Daily   Chlorhexidine Gluconate Cloth  6 each Topical Q0600   cinacalcet  60 mg Oral Q M,W,F-HD   diltiazem  120 mg Oral QPM   docusate sodium  100 mg Oral BID   gabapentin  300 mg Oral BID   heparin  5,000 Units Subcutaneous Q8H   lactulose  20 g Oral Daily   lidocaine-prilocaine  1 application Topical Q M,W,F-HD   metoprolol succinate  12.5 mg Oral Daily   nicotine  21 mg Transdermal Daily   pantoprazole  40 mg Oral Daily   sevelamer carbonate  800 mg Oral TID WC   sodium chloride flush  3 mL Intravenous Q12H    sodium chloride     sodium chloride, acetaminophen **OR** acetaminophen, hydrALAZINE, ipratropium-albuterol, melatonin, ondansetron, oxyCODONE, polyethylene glycol, sodium chloride flush

## 2021-03-07 LAB — CBC
HCT: 28.1 % — ABNORMAL LOW (ref 39.0–52.0)
Hemoglobin: 10 g/dL — ABNORMAL LOW (ref 13.0–17.0)
MCH: 27.5 pg (ref 26.0–34.0)
MCHC: 35.6 g/dL (ref 30.0–36.0)
MCV: 77.4 fL — ABNORMAL LOW (ref 80.0–100.0)
Platelets: 180 10*3/uL (ref 150–400)
RBC: 3.63 MIL/uL — ABNORMAL LOW (ref 4.22–5.81)
RDW: 13.9 % (ref 11.5–15.5)
WBC: 8.3 10*3/uL (ref 4.0–10.5)
nRBC: 0 % (ref 0.0–0.2)

## 2021-03-07 LAB — BASIC METABOLIC PANEL
Anion gap: 11 (ref 5–15)
BUN: 31 mg/dL — ABNORMAL HIGH (ref 6–20)
CO2: 27 mmol/L (ref 22–32)
Calcium: 8.1 mg/dL — ABNORMAL LOW (ref 8.9–10.3)
Chloride: 92 mmol/L — ABNORMAL LOW (ref 98–111)
Creatinine, Ser: 10.04 mg/dL — ABNORMAL HIGH (ref 0.61–1.24)
GFR, Estimated: 5 mL/min — ABNORMAL LOW (ref >60)
Glucose, Bld: 151 mg/dL — ABNORMAL HIGH (ref 70–99)
Potassium: 5.4 mmol/L — ABNORMAL HIGH (ref 3.5–5.1)
Sodium: 130 mmol/L — ABNORMAL LOW (ref 135–145)

## 2021-03-07 LAB — MAGNESIUM: Magnesium: 1.9 mg/dL (ref 1.7–2.4)

## 2021-03-07 NOTE — Progress Notes (Signed)
Patient arrived back onto the unit from HD. Patient started to yell at RN about his discharge paperwork and took off tele box. Patient says "He has to go now." RN gave daily meds. RN notified CM for a taxi voucher for transport. Patient does not RN to perform daily shift assessment and wants to get dressed instead.

## 2021-03-07 NOTE — Progress Notes (Signed)
[facility] Progress Note  Subjective: seen on HD   Vitals:   03/07/21 0930 03/07/21 1030 03/07/21 1055 03/07/21 1140  BP: 109/66 (!) 85/58 99/60 (!) 108/56  Pulse: 99 99 89 92  Resp:   18 18  Temp:   98.5 F (36.9 C) 98.4 F (36.9 C)  TempSrc:   Axillary Oral  SpO2:   98% 93%  Weight:   80 kg   Height:        Exam: General: sitting in bed with no apparent distress HEENT: anicteric sclera, MMM CV: normal rate, no murmurs, trace edema in the bilateral ankles Lungs: bilateral chest rise, normal wob Abd: soft, non-tender, mild distention Skin: no visible lesions or rashes Psych: awake and alert, angry mood Neuro: normal speech, oriented to person and place but not time or situation        Home meds include amiodarone, sensipar , cardizem cd 120 qd, neurontin 300 bid, toprol xl 12.5 qd, oxycodone IR prn, protonix, renvela 800 tid ac, prns'    CXR -vasc congestion, no IS edema    [facility] HD[facility]  4h  2/2 bath 450 bfr  69kg   aVG    - 1.5 ug calcitriol tiw     Assessment/ Plan: ESRD: had HD early Friday am. For HD today.  Severe hyperkalemia - sp temporizing measures per ED providers. EKG w/ old LBBB. Got HD and K+ is wnl.  Volume/ BP: BP's low in the 80's -90's, no fluid removed. Up 4-5kg, UF 2-3 L if BP's permit today. No resp issues. Chronic ascites complicates interpreting the dry wt. CXR w/o edema.  Chronic ascites/ cirrhosis - gets weekly LVP on Tuesday's, last done this past Tuesday per the pt. Next LVP planned for Tuesday , per the patient.  Anemia of Chronic Kidney Disease: Hemoglobin 10.3. No ESA MBD ckd: Phos 7.3. Continue home binders and calcitriol. Vascular access: AVG with no issues Hyponatremia:  due to chronic vol overload History of Hep B/C: caution on HD, isolation.  Dispo - okay for dc after Hd today from renal standpoint     [name] 03/07/2021, 11:50 AM   Recent Labs  Lab 03/03/21 0638 03/04/21 1430 03/06/21 0340  03/07/21 0401  K 6.0*   < > 4.7 5.4*  BUN 52*   < > 26* 31*  CREATININE 12.34*   < > 8.68* 10.04*  CALCIUM 8.1*   < > 8.2* 8.1*  PHOS 7.3*  --   --   --   HGB  --    < > 9.7* 10.0*   < > = values in this interval not displayed.    Inpatient medications:  amiodarone  200 mg Oral Daily   Chlorhexidine Gluconate Cloth  6 each Topical Q0600   cinacalcet  60 mg Oral Q M,W,F-HD   diltiazem  120 mg Oral QPM   docusate sodium  100 mg Oral BID   gabapentin  300 mg Oral BID   heparin  5,000 Units Subcutaneous Q8H   lactulose  20 g Oral Daily   lidocaine-prilocaine  1 application Topical Q M,W,F-HD   metoprolol succinate  12.5 mg Oral Daily   nicotine  21 mg Transdermal Daily   pantoprazole  40 mg Oral Daily   sevelamer carbonate  800 mg Oral TID WC   sodium chloride flush  3 mL Intravenous Q12H    sodium chloride     sodium chloride, acetaminophen **OR** acetaminophen, hydrALAZINE, ipratropium-albuterol, melatonin, ondansetron, oxyCODONE, polyethylene  glycol, sodium chloride flush

## 2021-03-07 NOTE — Progress Notes (Signed)
Went over discharge instructions with the patient at the bedside. Patient has no IV access at shift assessment. Patient removed tele at shift change. CM has been notified. Transport has been set up via taxi. Patient is getting dressed.

## 2021-03-07 NOTE — Discharge Summary (Signed)
Physician Discharge Summary  [name] LFY:[id number] DOB: [birth year] DOA: 03/04/2021  PCP: Charlott Rakes, MD  Admit date: 03/04/2021 Discharge date: 03/07/2021  Admitted From: Home  Discharge disposition: Home   Recommendations for Outpatient Follow-Up:   Follow up with your primary care provider in one week.  Check CBC, BMP, magnesium in the next visit  Discharge Diagnosis:   Principal Problem:   Hyperkalemia Active Problems:   COPD (chronic obstructive pulmonary disease) (HCC)   Hypertension   ESRD on dialysis ([place])   Cirrhosis of liver with ascites (HCC)   AF (paroxysmal atrial fibrillation) ([place])   Acute encephalopathy   Discharge Condition: Improved.  Diet recommendation: Low sodium, heart healthy.  Carbohydrate-modified.  Regular.  Wound care: None.  Code status: Full.   History of Present Illness:   [name] is a 60 y.o. male with past medical history of end-stage renal disease on hemodialysis Monday, Wednesday Friday, cirrhosis of liver secondary to alcoholic liver disease, hepatitis B and C, COPD with cor pulmonale, atrial fibrillation, anemia and polysubstance abuse was brought into the hospital with complaints of generalized weakness. In the ED, patient was noted to be mildly hypotensive.    Patient was very somnolent and had pinpoint pupils so received 1.2 mg of Narcan as well.  Patient was little more alert after Narcan injection.  He was also hypoxic on room air so supplemental oxygen was placed in with 2 L of oxygen by nasal cannula.Marland Kitchen  He was afebrile.  Laboratory data was significant for potassium of 7.1, sodium of 125.  EKG showed some tall T waves.  Nephrology was consulted from the ED for urgent hemodialysis.  WBC was elevated at 12.2.  Patient was then admitted hospital for further evaluation and treatment.   Hospital Course:   Following conditions were addressed during hospitalization as listed below,  Metabolic  encephalopathy resolved.  Likely secondary to use of narcotics and missed hemodialysis with underlying liver disease.  Received Narcan in ED. improved encephalopathy at this time after receiving dialysis.     Hyperkalemia with EKG changes.  Patient initially received temporizing measures in the ED including D10 calcium gluconate and insulin.  Patient received hemodialysis during hospitalization and hyperkalemia improved.  Potassium was 5.4 in the morning of discharge but patient got hemodialysis after that.  Hyponatremia.  Secondary to volume overload.  improved after hemodialysis.   End-stage renal disease on hemodialysis.  Missed dialysis sessions for a week.  Received hemodialysis x2 during hospitalization  History of atrial fibrillation on amiodarone and metoprolol,rate controlled   COPD with cor pulmonale.  Patient received bronchodilators during hospitalization.   Anemia of chronic kidney disease.   Latest hemoglobin of 10.0.   History of ischemia to the hand status post right index and ring finger amputation.  Continues to smoke.  Continue nicotine patch   History polysubstance abuse Reported that he is not using cocaine or alcohol anymore.  He however continues to smoke.  Nicotine patch was given during hospitalization.   Chronic pain.  Patient is on oxycodone and Neurontin at home.     Generalized weakness, possible fall.  Patient was able to ambulate by himself prior to discharge.  Disposition.  At this time, patient is stable for disposition home with outpatient PCP and nephrology follow-up.  Medical Consultants:   Nephrology   Procedures:    Hemodialysis Subjective:   Today, patient was seen and examined at bedside.  Seen during hemodialysis.  Discharge Exam:   Vitals:  03/07/21 0930 03/07/21 1030  BP: 109/66 (!) (P) 85/58  Pulse: 99   Resp:    Temp:    SpO2:     Vitals:   03/07/21 0830 03/07/21 0900 03/07/21 0930 03/07/21 1030  BP: 105/71 94/62 109/66  (!) (P) 85/58  Pulse: 94 (!) 116 99   Resp:      Temp:      TempSrc:      SpO2:      Weight:      Height:       General: Alert awake, not in obvious distress, on nasal cannula oxygen HENT: pupils equally reacting to light,  No scleral pallor or icterus noted. Oral mucosa is moist.  Chest:   Diminished breath sounds bilaterally. No crackles or wheezes.  CVS: S1 &S2 heard. No murmur.  Regular rate and rhythm. Abdomen: Soft, nontender, nondistended.  Bowel sounds are heard.   Extremities: No cyanosis, clubbing or edema.  Peripheral pulses are palpable. Psych: Alert, awake and oriented, normal mood CNS:  No cranial nerve deficits.  Power equal in all extremities.   Skin: Warm and dry.  No rashes noted.  The results of significant diagnostics from this hospitalization (including imaging, microbiology, ancillary and laboratory) are listed below for reference.     Diagnostic Studies:   No results found.   Labs:   Basic Metabolic Panel: Recent Labs  Lab 03/03/21 0638 03/04/21 1430 03/04/21 2128 03/05/21 1031 03/06/21 0340 03/07/21 0401  NA 124* 125*  --  134* 132* 130*  K 6.0* 7.1*  --  4.0 4.7 5.4*  CL 85* 90*  --  94* 92* 92*  CO2 25 21*  --  28 27 27   GLUCOSE 182* 89  --  132* 103* 151*  BUN 52* 61*  --  27* 26* 31*  CREATININE 12.34* 13.55* 13.92* 7.89* 8.68* 10.04*  CALCIUM 8.1* 7.5*  --  8.0* 8.2* 8.1*  MG  --   --   --   --   --  1.9  PHOS 7.3*  --   --   --   --   --    GFR Estimated Creatinine Clearance: 8 mL/min (A) (by C-G formula based on SCr of 10.04 mg/dL (H)). Liver Function Tests: Recent Labs  Lab 03/02/21 1948 03/03/21 0638  AST 52*  --   ALT 17  --   ALKPHOS 183*  --   BILITOT 1.1  --   PROT 5.8*  --   ALBUMIN 2.1* 2.1*   No results for input(s): LIPASE, AMYLASE in the last 168 hours. Recent Labs  Lab 03/03/21 0307 03/05/21 0408  AMMONIA 23 24   Coagulation profile No results for input(s): INR, PROTIME in the last 168  hours.  CBC: Recent Labs  Lab 03/02/21 1948 03/02/21 2007 03/04/21 1430 03/04/21 2128 03/05/21 0409 03/06/21 0340 03/07/21 0401  WBC 7.5   < > 12.2* 10.3 8.9 8.0 8.3  NEUTROABS 5.8  --  9.9*  --   --   --   --   HGB 10.3*   < > 10.5* 10.2* 9.8* 9.7* 10.0*  HCT 29.3*   < > 30.7* 28.7* 27.4* 27.7* 28.1*  MCV 78.6*   < > 79.9* 77.6* 77.0* 78.0* 77.4*  PLT 187   < > 173 168 164 165 180   < > = values in this interval not displayed.   Cardiac Enzymes: No results for input(s): CKTOTAL, CKMB, CKMBINDEX, TROPONINI in the last 168 hours. BNP: Invalid input(s):  POCBNP CBG: Recent Labs  Lab 03/03/21 0344 03/03/21 0551 03/03/21 0958 03/03/21 1220 03/04/21 1431  GLUCAP 117* 109* 80 96 87   D-Dimer No results for input(s): DDIMER in the last 72 hours. Hgb A1c No results for input(s): HGBA1C in the last 72 hours. Lipid Profile No results for input(s): CHOL, HDL, LDLCALC, TRIG, CHOLHDL, LDLDIRECT in the last 72 hours. Thyroid function studies No results for input(s): TSH, T4TOTAL, T3FREE, THYROIDAB in the last 72 hours.  Invalid input(s): FREET3 Anemia work up No results for input(s): VITAMINB12, FOLATE, FERRITIN, TIBC, IRON, RETICCTPCT in the last 72 hours. Microbiology Recent Results (from the past 240 hour(s))  Resp Panel by RT-PCR (Flu A&B, Covid) Nasopharyngeal Swab     Status: None   Collection Time: 03/03/21 12:32 AM   Specimen: Nasopharyngeal Swab; Nasopharyngeal(NP) swabs in vial transport medium  Result Value Ref Range Status   SARS Coronavirus 2 by RT PCR NEGATIVE NEGATIVE Final    Comment: (NOTE) SARS-CoV-2 target nucleic acids are NOT DETECTED.  The SARS-CoV-2 RNA is generally detectable in upper respiratory specimens during the acute phase of infection. The lowest concentration of SARS-CoV-2 viral copies this assay can detect is 138 copies/mL. A negative result does not preclude SARS-Cov-2 infection and should not be used as the sole basis for treatment  or other patient management decisions. A negative result may occur with  improper specimen collection/handling, submission of specimen other than nasopharyngeal swab, presence of viral mutation(s) within the areas targeted by this assay, and inadequate number of viral copies(<138 copies/mL). A negative result must be combined with clinical observations, patient history, and epidemiological information. The expected result is Negative.  Fact Sheet for Patients:  EntrepreneurPulse.com.au  Fact Sheet for Healthcare Providers:  IncredibleEmployment.be  This test is no t yet approved or cleared by the [place] FDA and  has been authorized for detection and/or diagnosis of SARS-CoV-2 by FDA under an Emergency Use Authorization (EUA). This EUA will remain  in effect (meaning this test can be used) for the duration of the COVID-19 declaration under Section 564(b)(1) of the Act, 21 U.S.C.section 360bbb-3(b)(1), unless the authorization is terminated  or revoked sooner.       Influenza A by PCR NEGATIVE NEGATIVE Final   Influenza B by PCR NEGATIVE NEGATIVE Final    Comment: (NOTE) The Xpert Xpress SARS-CoV-2/FLU/RSV plus assay is intended as an aid in the diagnosis of influenza from Nasopharyngeal swab specimens and should not be used as a sole basis for treatment. Nasal washings and aspirates are unacceptable for Xpert Xpress SARS-CoV-2/FLU/RSV testing.  Fact Sheet for Patients: EntrepreneurPulse.com.au  Fact Sheet for Healthcare Providers: IncredibleEmployment.be  This test is not yet approved or cleared by the [place] FDA and has been authorized for detection and/or diagnosis of SARS-CoV-2 by FDA under an Emergency Use Authorization (EUA). This EUA will remain in effect (meaning this test can be used) for the duration of the COVID-19 declaration under Section 564(b)(1) of the Act, 21 U.S.C. section  360bbb-3(b)(1), unless the authorization is terminated or revoked.  Performed at [facility], [place] [address], [place], [place] [address]   Resp Panel by RT-PCR (Flu A&B, Covid) Nasopharyngeal Swab     Status: None   Collection Time: 03/04/21  4:32 PM   Specimen: Nasopharyngeal Swab; Nasopharyngeal(NP) swabs in vial transport medium  Result Value Ref Range Status   SARS Coronavirus 2 by RT PCR NEGATIVE NEGATIVE Final    Comment: (NOTE) SARS-CoV-2 target nucleic acids are NOT  DETECTED.  The SARS-CoV-2 RNA is generally detectable in upper respiratory specimens during the acute phase of infection. The lowest concentration of SARS-CoV-2 viral copies this assay can detect is 138 copies/mL. A negative result does not preclude SARS-Cov-2 infection and should not be used as the sole basis for treatment or other patient management decisions. A negative result may occur with  improper specimen collection/handling, submission of specimen other than nasopharyngeal swab, presence of viral mutation(s) within the areas targeted by this assay, and inadequate number of viral copies(<138 copies/mL). A negative result must be combined with clinical observations, patient history, and epidemiological information. The expected result is Negative.  Fact Sheet for Patients:  EntrepreneurPulse.com.au  Fact Sheet for Healthcare Providers:  IncredibleEmployment.be  This test is no t yet approved or cleared by the [place] FDA and  has been authorized for detection and/or diagnosis of SARS-CoV-2 by FDA under an Emergency Use Authorization (EUA). This EUA will remain  in effect (meaning this test can be used) for the duration of the COVID-19 declaration under Section 564(b)(1) of the Act, 21 U.S.C.section 360bbb-3(b)(1), unless the authorization is terminated  or revoked sooner.       Influenza A by PCR NEGATIVE NEGATIVE Final   Influenza B by PCR NEGATIVE  NEGATIVE Final    Comment: (NOTE) The Xpert Xpress SARS-CoV-2/FLU/RSV plus assay is intended as an aid in the diagnosis of influenza from Nasopharyngeal swab specimens and should not be used as a sole basis for treatment. Nasal washings and aspirates are unacceptable for Xpert Xpress SARS-CoV-2/FLU/RSV testing.  Fact Sheet for Patients: EntrepreneurPulse.com.au  Fact Sheet for Healthcare Providers: IncredibleEmployment.be  This test is not yet approved or cleared by the [place] FDA and has been authorized for detection and/or diagnosis of SARS-CoV-2 by FDA under an Emergency Use Authorization (EUA). This EUA will remain in effect (meaning this test can be used) for the duration of the COVID-19 declaration under Section 564(b)(1) of the Act, 21 U.S.C. section 360bbb-3(b)(1), unless the authorization is terminated or revoked.  Performed at [facility], [place] [address], [place], [place] [address]      Discharge Instructions:   Discharge Instructions     Diet - low sodium heart healthy   Complete by: As directed    Discharge instructions   Complete by: As directed    Follow up with your primary care provider in one week. Continue hemodialysis as scheduled.   Increase activity slowly   Complete by: As directed       Allergies as of 03/07/2021       Reactions   Tramadol Itching, Other (See Comments)   Other reaction(s): Unknown   Grass Extracts [gramineae Pollens]    Other reaction(s): Sneezing   Morphine And Related Other (See Comments)   Stomach pain   Pollen Extract Other (See Comments)   Other reaction(s): Sneezing (finding)   Acetaminophen Nausea Only   Stomach ache   Aspirin Itching, Other (See Comments)   STOMACH PAIN Other reaction(s): Unknown   Clonidine Derivatives Itching        Medication List     TAKE these medications    acetaminophen 500 MG tablet Commonly known as: TYLENOL Take 500-1,000 mg  by mouth every 6 (six) hours as needed (pain.).   albuterol 108 (90 Base) MCG/ACT inhaler Commonly known as: VENTOLIN HFA INHALE 2 PUFFS BY MOUTH EVERY 4 HOURS AS NEEDED FOR WHEEZE OR FOR SHORTNESS OF BREATH What changed: See the new instructions.  amiodarone 200 MG tablet Commonly known as: PACERONE TAKE 1 TABLET BY MOUTH EVERY DAY What changed: when to take this   cetirizine 10 MG tablet Commonly known as: ZYRTEC Take 10 mg by mouth daily.   cinacalcet 30 MG tablet Commonly known as: SENSIPAR TAKE 2 TABLETS (60 MG TOTAL) BY MOUTH EVERY MONDAY, WEDNESDAY, AND FRIDAY WITH HEMODIALYSIS. What changed:  how much to take how to take this when to take this additional instructions   diltiazem 120 MG 24 hr capsule Commonly known as: CARDIZEM CD TAKE 1 CAPSULE BY MOUTH EVERY DAY What changed: how much to take   gabapentin 300 MG capsule Commonly known as: NEURONTIN TAKE 1 CAPSULE BY MOUTH IN THE MORNING AND AT BEDTIME What changed: See the new instructions.   lactulose 10 GM/15ML solution Commonly known as: CHRONULAC Take 30 mLs (20 g total) by mouth daily. What changed:  when to take this reasons to take this   lidocaine-prilocaine cream Commonly known as: EMLA Apply 1 application topically every Monday, Wednesday, and Friday with hemodialysis. Applied sparingly for port access   metoprolol succinate 25 MG 24 hr tablet Commonly known as: TOPROL-XL Take 0.5 tablets (12.5 mg total) by mouth daily. What changed: how much to take   ondansetron 4 MG disintegrating tablet Commonly known as: ZOFRAN-ODT Take 4 mg by mouth at bedtime as needed for nausea.   ondansetron 4 MG tablet Commonly known as: ZOFRAN TAKE 1 TABLET BY MOUTH EVERY 8 HOURS AS NEEDED FOR NAUSEA AND VOMITING What changed: See the new instructions.   Oxycodone HCl 10 MG Tabs TAKE 1 TABLET (10 MG TOTAL) BY MOUTH THREE TIMES DAILY AS NEEDED PAIN. What changed:  how much to take how to take this when to  take this reasons to take this   oxyCODONE 5 MG immediate release tablet Commonly known as: Roxicodone 1 tab PO q6 hours prn pain What changed: Another medication with the same name was changed. Make sure you understand how and when to take each.   pantoprazole 40 MG tablet Commonly known as: PROTONIX Take 40 mg by mouth daily.   polyethylene glycol 17 g packet Commonly known as: MIRALAX / GLYCOLAX Take 17 g by mouth daily as needed (constipation).   sevelamer carbonate 800 MG tablet Commonly known as: RENVELA Take 1,600-3,200 mg by mouth See admin instructions. Taking 4 tablets (3200 mg) three times daily with meals and 2 tabs (1600 mg) with snacks.        Follow-up Information     Charlott Rakes, MD Follow up.   Specialty: Family Medicine Contact information: [place] [address] [phone]                  Time coordinating discharge: 39 minutes  Signed:  Hunter Bachar  [facility] 03/07/2021, 11:15 AM

## 2021-03-07 NOTE — TOC CM/SW Note (Signed)
Made aware by nursing that patient need transportation home. [employer] provided. Took cab voucher to nursing unit. No further assessed.    Marthenia Rolling, MSN, RN,BSN [facility] Case Manager [phone]

## 2021-03-08 ENCOUNTER — Telehealth: Payer: Self-pay | Admitting: Nephrology

## 2021-03-08 NOTE — Telephone Encounter (Signed)
Transition of Care Contact from [place]  Date of Discharge: 03/07/21 Date of Contact: 03/08/21 Method of contact: phone - attempted  Attempted to contact patient to discuss transition of care from inpatient admission.  Patient did not answer the phone.  Message was left on patient's voicemail informing them we would attempt to call them again and if unable to reach will follow up at dialysis.  [name], PA-C [facility] Pager: [phone]

## 2021-03-09 ENCOUNTER — Telehealth: Payer: Self-pay | Admitting: Nephrology

## 2021-03-09 ENCOUNTER — Telehealth: Payer: Self-pay

## 2021-03-09 NOTE — Telephone Encounter (Signed)
Transition of care contact from inpatient facility  Date of Discharge: 03/07/21 Date of Contact: Attempted  not Successful  Method of contact: Phone  Attempted to contact patient to discuss transition of care from inpatient admission. Patient did not answer the phone. Message was left on the patient's voicemail with call back number [phone].

## 2021-03-09 NOTE — Telephone Encounter (Signed)
Transition Care Management Unsuccessful Follow-up Telephone Call  Date of discharge and from where:  03/07/2021, [facility]   Attempts:  1st Attempt  Reason for unsuccessful TCM follow-up call:  Left voice message on # [phone].   He has televisit with Lazaro Arms, NP 03/10/2021.

## 2021-03-10 ENCOUNTER — Other Ambulatory Visit: Payer: Self-pay

## 2021-03-10 ENCOUNTER — Other Ambulatory Visit: Payer: Self-pay | Admitting: Nurse Practitioner

## 2021-03-10 ENCOUNTER — Other Ambulatory Visit: Payer: Self-pay | Admitting: Family Medicine

## 2021-03-10 ENCOUNTER — Telehealth: Payer: Self-pay

## 2021-03-10 ENCOUNTER — Telehealth: Payer: Medicare Other | Admitting: Nurse Practitioner

## 2021-03-10 ENCOUNTER — Ambulatory Visit (HOSPITAL_COMMUNITY)
Admission: RE | Admit: 2021-03-10 | Discharge: 2021-03-10 | Disposition: A | Discharge status: Home / Self Care | Payer: Medicare Other | Source: Ambulatory Visit | Attending: Nephrology | Admitting: Nephrology

## 2021-03-10 ENCOUNTER — Telehealth: Payer: Self-pay | Admitting: Nurse Practitioner

## 2021-03-10 DIAGNOSIS — R188 Other ascites: Secondary | ICD-10-CM

## 2021-03-10 DIAGNOSIS — K219 Gastro-esophageal reflux disease without esophagitis: Secondary | ICD-10-CM

## 2021-03-10 DIAGNOSIS — R110 Nausea: Secondary | ICD-10-CM

## 2021-03-10 DIAGNOSIS — K7460 Unspecified cirrhosis of liver: Secondary | ICD-10-CM

## 2021-03-10 HISTORY — PX: IR PARACENTESIS: IMG2679

## 2021-03-10 MED ORDER — LIDOCAINE HCL (PF) 1 % IJ SOLN
INTRAMUSCULAR | Status: DC | PRN
  Administered 2021-03-10: 5 mL

## 2021-03-10 MED ORDER — LIDOCAINE HCL 1 % IJ SOLN
INTRAMUSCULAR | Status: AC
  Filled 2021-03-10: qty 20

## 2021-03-10 NOTE — Procedures (Signed)
Ultrasound-guided therapeutic paracentesis performed yielding 4 liters of  serosanguineous colored fluid. No immediate complications. EBL is none.

## 2021-03-10 NOTE — Telephone Encounter (Signed)
Requested medications are due for refill today.  yes  Requested medications are on the active medications list.  yes  Last refill. 02/07/2021  Future visit scheduled.   yes  Notes to clinic.  Medication not delegated.

## 2021-03-10 NOTE — Telephone Encounter (Signed)
Pt called saying he needs to be scheduled for a paracentesis next week at the hospital.  [phone]

## 2021-03-10 NOTE — Telephone Encounter (Signed)
Transition Care Management Follow-up Telephone Call- call received from patient. He said he was at the hospital   Date of discharge and from where: 03/07/2021, [facility]  How have you been since you were released from the hospital? He said that he is feeling " bad."  When asked to explain what he was referring to , he could not explain anything more specific and said " just bad."   He said he would like his provider to read his records and now " everything about him."  Any questions or concerns? Yes - he said that he desperately needs medication for nausea. When asked if he has ondansetron, he said he has none. He denied any vomiting and said he has nausea all of the time due to the increased fluid in his abdomen.  He said he just needs the medication and is " tired of begging" for what he needs.   Items Reviewed: Did the pt receive and understand the discharge instructions provided? Yes  Medications obtained and verified? Yes  - he did not have any questions about his med regime.  Other? No  Any new allergies since your discharge? No  Dietary orders reviewed? No Do you have support at home?  He lives alone and said that he does not have any family that helps him out   [place] and Equipment/Supplies: Were home health services ordered? no If so, what is the name of the agency? N/a  Has the agency set up a time to come to the patient's home? not applicable Were any new equipment or medical supplies ordered?  No What is the name of the medical supply agency? N/a Were you able to get the supplies/equipment? not applicable Do you have any questions related to the use of the equipment or supplies? No  Attends HD: M/W/F at [place] [facility]: (I = Independent and D = Dependent) ADLs: independent. Has cane to use with ambulation when needed   Follow up appointments reviewed:  PCP Hospital f/u appt confirmed? Yes  Scheduled to see Lazaro Arms, NP on  03/11/2021- virtual visit. [place] [facility] f/u appt confirmed? No  - none scheduled at this time,  Are transportation arrangements needed? No - he said he drives.  If their condition worsens, is the pt aware to call PCP or go to the Emergency Dept.? Yes Was the patient provided with contact information for the PCP's office or ED? Yes Was to pt encouraged to call back with questions or concerns? Yes

## 2021-03-10 NOTE — Telephone Encounter (Signed)
Called and gave patient the number for nephrology per note in chart.  Lazaro Arms, FNP-C

## 2021-03-10 NOTE — Telephone Encounter (Signed)
Call placed to patient to obtain more information about his request for paracentesis and to inform him that message was sent to provider regarding his request for medication for nausea.  Message left with call back requested to this CM.

## 2021-03-10 NOTE — Telephone Encounter (Signed)
Transition Care Management Unsuccessful Follow-up Telephone Call  Date of discharge and from where:  03/07/2021, [facility]   Attempts:  2nd Attempt  Reason for unsuccessful TCM follow-up call:  Left voice message on [phone].  Patient now has appointment scheduled with Lazaro Arms, NP - 03/11/2021

## 2021-03-10 NOTE — Telephone Encounter (Signed)
It looks like nephrology tried to contact patient - this might have been for dialysis. Please route dialysis message to nephrology. Please see note in chart.   I attempted to contact patient for virtual visit this morning and he stated that he wanted to reschedule because he was eating breakfast. Rescheduled appointment for tomorrow.   Thanks,  Lazaro Arms, FNP-C

## 2021-03-10 NOTE — Telephone Encounter (Signed)
From the discharge call:  He said that he is feeling " bad."  When asked to explain what he was referring to , he could not explain anything more specific and said " just bad."   He said he would like his provider to read his records and now " everything about him."   he said that he desperately needs medication for nausea. When asked if he has ondansetron, he said he has none. He denied any vomiting and said he has nausea all of the time due to the increased fluid in his abdomen.  He said he just needs the medication and is " tired of begging" for what he needs.    Scheduled to see Joshua Arms, NP on 03/11/2021- virtual visit.

## 2021-03-10 NOTE — Telephone Encounter (Signed)
Copied from [place] [id number]. Topic: Quick Communication - Rx Refill/Question >> Mar 10, 2021  3:13 PM Leward Quan A wrote: Medication: ondansetron (ZOFRAN-ODT) 4 MG disintegrating tablet  Asking for Rx today please  Has the patient contacted their pharmacy? Yes.   (Agent: If no, request that the patient contact the pharmacy for the refill.) (Agent: If yes, when and what did the pharmacy advise?)  Preferred Pharmacy (with phone number or street name): [id number] - [place], [place]  Phone:  [phone] Fax:  [phone]     Agent: Please be advised that RX refills may take up to 3 business days. We ask that you follow-up with your pharmacy.

## 2021-03-11 ENCOUNTER — Ambulatory Visit (INDEPENDENT_AMBULATORY_CARE_PROVIDER_SITE_OTHER): Payer: Medicare Other | Admitting: Nurse Practitioner

## 2021-03-11 ENCOUNTER — Encounter: Payer: Self-pay | Admitting: Nurse Practitioner

## 2021-03-11 DIAGNOSIS — D631 Anemia in chronic kidney disease: Secondary | ICD-10-CM

## 2021-03-11 DIAGNOSIS — E876 Hypokalemia: Secondary | ICD-10-CM

## 2021-03-11 DIAGNOSIS — N186 End stage renal disease: Secondary | ICD-10-CM

## 2021-03-11 DIAGNOSIS — G9340 Encephalopathy, unspecified: Secondary | ICD-10-CM | POA: Diagnosis not present

## 2021-03-11 DIAGNOSIS — N189 Chronic kidney disease, unspecified: Secondary | ICD-10-CM

## 2021-03-11 DIAGNOSIS — R110 Nausea: Secondary | ICD-10-CM

## 2021-03-11 DIAGNOSIS — E871 Hypo-osmolality and hyponatremia: Secondary | ICD-10-CM

## 2021-03-11 DIAGNOSIS — K219 Gastro-esophageal reflux disease without esophagitis: Secondary | ICD-10-CM | POA: Diagnosis not present

## 2021-03-11 DIAGNOSIS — R531 Weakness: Secondary | ICD-10-CM

## 2021-03-11 DIAGNOSIS — Z992 Dependence on renal dialysis: Secondary | ICD-10-CM

## 2021-03-11 MED ORDER — ONDANSETRON HCL 4 MG PO TABS: 4.0000 mg | ORAL_TABLET | Freq: Three times a day (TID) | ORAL | 0 refills | Status: AC | PRN

## 2021-03-11 NOTE — Progress Notes (Signed)
Virtual Visit via Telephone Note  I connected with [name] on 03/11/21 at 11:10 AM EDT by telephone and verified that I am speaking with the correct person using two identifiers.  Location: Patient: home Provider: office   I discussed the limitations, risks, security and privacy concerns of performing an evaluation and management service by telephone and the availability of in person appointments. I also discussed with the patient that there may be a patient responsible charge related to this service. The patient expressed understanding and agreed to proceed.  60 y.o. male with past medical history of end-stage renal disease on hemodialysis Monday, Wednesday Friday, cirrhosis of liver secondary to alcoholic liver disease, hepatitis B and C, COPD with cor pulmonale, atrial fibrillation, anemia and polysubstance abuse   Recent Significant events:  Hospital admission 03/04/21:  Hospital Course:  Metabolic encephalopathy resolved.  Likely secondary to use of narcotics and missed hemodialysis with underlying liver disease.  Received Narcan in ED. improved encephalopathy at this time after receiving dialysis.     Hyperkalemia with EKG changes.  Patient initially received temporizing measures in the ED including D10 calcium gluconate and insulin.  Patient received hemodialysis during hospitalization and hyperkalemia improved.  Potassium was 5.4 in the morning of discharge but patient got hemodialysis after that.   Hyponatremia.  Secondary to volume overload.  improved after hemodialysis.   End-stage renal disease on hemodialysis.  Missed dialysis sessions for a week.  Received hemodialysis x2 during hospitalization   History of atrial fibrillation on amiodarone and metoprolol,rate controlled   COPD with cor pulmonale.  Patient received bronchodilators during hospitalization.   Anemia of chronic kidney disease.   Latest hemoglobin of 10.0.   History of ischemia to the hand  status post right index and ring finger amputation.  Continues to smoke.  Continue nicotine patch   History polysubstance abuse Reported that he is not using cocaine or alcohol anymore.  He however continues to smoke.  Nicotine patch was given during hospitalization.   Chronic pain.  Patient is on oxycodone and Neurontin at home.     Generalized weakness, possible fall.  Patient was able to ambulate by himself prior to discharge.   Disposition.  At this time, patient is stable for disposition home with outpatient PCP and nephrology follow-up.   History of Present Illness:  Patient presents today through televisit for hospital follow-up/transition of care visit.  Patient was admitted to the hospital on 03/04/2021.  Please see above hospital course.  Patient states that today he is doing well overall.  He does feel as though he has fluid on his abdomen.  He does have dialysis scheduled today and does have transportation available for this.  Patient does have an in office hospital follow-up scheduled for next week with his PCP office.  We discussed that it is important for him to keep this appointment.  He will need follow-up labs at this appointment.  Patient states that he does have his medications and is taking them as directed.  Patient is requesting refill for Zofran.  Patient states that he has been contacted by nephrology and will have a telephone visit with them today. Denies f/c/s, n/v/d, hemoptysis, PND, chest pain or edema.    Note: Patient is scheduled to speak with nephrology today as well.  Patient has called the office requesting paracentesis.  Patient's PCP Dr. Margarita Rana to order this if indicated.    Observations/Objective:  Vitals with BMI 03/10/2021 03/07/2021 03/07/2021  Height - - -  Weight - - 176 lbs 6 oz  BMI - - 06.01  Systolic 97 561 99  Diastolic 62 56 60  Pulse - 92 89  Some encounter information is confidential and restricted. Go to Review Flowsheets activity to see  all data.      Assessment and Plan:  Hyperkalemia with EKG changes:  Improved after dialysis in hospital  Scheduled for dialysis today  Encouraged to keep all dialysis appointments  Please keep follow up appointments next week - will need follow up labs   Hyponatremia:  mproved after dialysis in hospital  Scheduled for dialysis today  Encouraged to keep all dialysis appointments  Please keep follow up appointments next week - will need follow up labs   End-stage renal disease on hemodialysis.    Missed dialysis sessions for a week prior to hospitalization.    Received hemodialysis x2 during hospitalization  Dialysis today   History of atrial fibrillation:   Continue amiodarone and metoprolol for rate control   COPD with cor pulmonale.    Patient received bronchodilators during hospitalization.  Continue inhalers as ordered   Anemia of chronic kidney disease:  Latest hemoglobin of 10.0  Please keep follow up appointment next week  - will need labs    History polysubstance abuse  Please continue to abstain from any illegal substances or alcohol  Please quit smoking    May continue Nicotine patch   Chronic pain:  Currently prescribed oxycodone and Neurontin    Generalized weakness, possible fall:  Patient was able to ambulate by himself prior to discharge.  Follow up:  Follow up with PCP as scheduled next week - will need follow up labs     I discussed the assessment and treatment plan with the patient. The patient was provided an opportunity to ask questions and all were answered. The patient agreed with the plan and demonstrated an understanding of the instructions.   The patient was advised to call back or seek an in-person evaluation if the symptoms worsen or if the condition fails to improve as anticipated.  I provided 23 minutes of non-face-to-face time during this encounter.   Fenton Foy, NP

## 2021-03-11 NOTE — Telephone Encounter (Signed)
Call placed to patient to inform him Dr Margarita Rana placed orders for paracentesis and refills for zofran have been sent to his pharmacy.  Message left with call back requested to this CM.

## 2021-03-11 NOTE — Patient Instructions (Addendum)
Hyperkalemia with EKG changes:  Improved after dialysis in hospital  Scheduled for dialysis today  Encouraged to keep all dialysis appointments  Please keep follow up appointments next week - will need follow up labs   Hyponatremia:  mproved after dialysis in hospital  Scheduled for dialysis today  Encouraged to keep all dialysis appointments  Please keep follow up appointments next week - will need follow up labs   End-stage renal disease on hemodialysis.    Missed dialysis sessions for a week prior to hospitalization.    Received hemodialysis x2 during hospitalization  Dialysis today   History of atrial fibrillation:   Continue amiodarone and metoprolol for rate control   COPD with cor pulmonale.    Patient received bronchodilators during hospitalization.  Continue inhalers as ordered   Anemia of chronic kidney disease:  Latest hemoglobin of 10.0  Please keep follow up appointment next week  - will need labs    History polysubstance abuse  Please continue to abstain from any illegal substances or alcohol  Please quit smoking    May continue Nicotine patch   Chronic pain:  Currently prescribed oxycodone and Neurontin    Generalized weakness, possible fall:  Patient was able to ambulate by himself prior to discharge.  Follow up:  Follow up with PCP as scheduled next week - will need follow up labs

## 2021-03-11 NOTE — Telephone Encounter (Signed)
Paracentesis orders have been placed.  Zofran refill sent to pharmacy.

## 2021-03-12 ENCOUNTER — Other Ambulatory Visit: Payer: Self-pay

## 2021-03-12 ENCOUNTER — Ambulatory Visit: Payer: Medicare Other | Attending: Family Medicine

## 2021-03-12 ENCOUNTER — Telehealth: Payer: Self-pay | Admitting: Family Medicine

## 2021-03-12 DIAGNOSIS — R296 Repeated falls: Secondary | ICD-10-CM | POA: Diagnosis present

## 2021-03-12 DIAGNOSIS — R2689 Other abnormalities of gait and mobility: Secondary | ICD-10-CM | POA: Insufficient documentation

## 2021-03-12 DIAGNOSIS — M6281 Muscle weakness (generalized): Secondary | ICD-10-CM | POA: Diagnosis not present

## 2021-03-12 NOTE — Telephone Encounter (Signed)
Pt had office visit with Gladis Riffle and orders for Paracentesis was placed.

## 2021-03-12 NOTE — Therapy (Signed)
[place], [place], [address] Phone: [phone]   Fax:  [phone]  Physical Therapy Treatment/ Re-certification/ Discharge Summary  Patient Details  Name: [name] MRN: [id number] Date of Birth: 1961/05/06 Referring Provider (PT): Charlott Rakes, MD   Encounter Date: 03/12/2021   PT End of Session - 03/12/21 1659     Visit Number 3    Number of Visits 6    Authorization Type U[employer] MCR    Progress Note Due on Visit 10    PT Start Time 4401    PT Stop Time 1640    PT Time Calculation (min) 25 min    Activity Tolerance Patient limited by pain;Patient limited by lethargy;Patient limited by fatigue    Behavior During Therapy [facility] for tasks assessed/performed             Past Medical History:  Diagnosis Date   Anemia    Anxiety    Arthritis    left shoulder   Arthritis    Atherosclerosis of aorta (HCC)    Cardiomegaly    Chest pain    DATE UNKNOWN, C/O PERIODICALLY   Chest pain    Cocaine abuse (HCC)    COPD (chronic obstructive pulmonary disease) (HCC)    COPD exacerbation ([place]) 08/17/2016   Coronary artery disease    stent 02/22/17   Coronary artery disease    Dysrhythmia    ESRD (end stage renal disease) (HCC)    ESRD (end stage renal disease) on dialysis ([place])    "[name]; M-W-F" (07/04/2017)   GERD (gastroesophageal reflux disease)    DATE UNKNOWN   GERD (gastroesophageal reflux disease)    Hemorrhoids    Hepatitis B    Hepatitis B, chronic (HCC)    Hepatitis C    History of kidney stones    Hyperkalemia    Hypertension    Kidney failure    Metabolic bone disease    Patient denies   Mitral stenosis    Myocardial infarction [facility])    Pneumonia    Pulmonary edema    Renal disorder    Solitary rectal ulcer syndrome 07/2017   at flex sig for rectal bleeding   Solitary rectal ulcer syndrome    Tubular adenoma of colon     Past Surgical History:  Procedure  Laterality Date   A/V FISTULAGRAM Left 05/26/2017   Procedure: A/V FISTULAGRAM;  Surgeon: Conrad Sterling, MD;  Location: [place] CV LAB;  Service: Cardiovascular;  Laterality: Left;   A/V FISTULAGRAM Right 11/18/2017   Procedure: A/V FISTULAGRAM - Right Arm;  Surgeon: Elam Dutch, MD;  Location: [place] CV LAB;  Service: Cardiovascular;  Laterality: Right;   AMPUTATION Right 09/04/2020   Procedure: RIGHT INDEX AND RIGHT RING FINGER AMPUTATION DIGIT;  Surgeon: Leanora Cover, MD;  Location: [place];  Service: Orthopedics;  Laterality: Right;   AMPUTATION Right 11/13/2020   Procedure: RIGHT INDEX FINGER AMPUTATION AND RIGHT RING FINGER AMPUTATION;  Surgeon: Leanora Cover, MD;  Location: [place];  Service: Orthopedics;  Laterality: Right;   APPLICATION OF WOUND VAC Left 06/14/2017   Procedure: APPLICATION OF WOUND VAC;  Surgeon: Katha Cabal, MD;  Location: [facility];  Service: Vascular;  Laterality: Left;   AV FISTULA PLACEMENT  2012   BELIEVED WAS PLACED IN JUNE   AV FISTULA PLACEMENT Right 08/09/2017   Procedure: Creation Right arm ARTERIOVENOUS BRACHIOCEPOHALIC FISTULA;  Surgeon: Elam Dutch, MD;  Location: [place];  Service: Vascular;  Laterality: Right;   AV FISTULA PLACEMENT Right 11/22/2017   Procedure: INSERTION OF ARTERIOVENOUS (AV) GORE-TEX GRAFT RIGHT UPPER ARM;  Surgeon: Elam Dutch, MD;  Location: [place];  Service: Vascular;  Laterality: Right;   BIOPSY  01/25/2018   Procedure: BIOPSY;  Surgeon: Jerene Bears, MD;  Location: [place];  Service: Gastroenterology;;   BIOPSY  04/10/2019   Procedure: BIOPSY;  Surgeon: Jerene Bears, MD;  Location: [facility];  Service: Gastroenterology;;   COLONOSCOPY     COLONOSCOPY WITH PROPOFOL N/A 01/25/2018   Procedure: COLONOSCOPY WITH PROPOFOL;  Surgeon: Jerene Bears, MD;  Location: [place];  Service: Gastroenterology;  Laterality: N/A;   CORONARY STENT INTERVENTION N/A 02/22/2017   Procedure: CORONARY STENT INTERVENTION;   Surgeon: Nigel Mormon, MD;  Location: [place] CV [facility];  Service: Cardiovascular;  Laterality: N/A;   ESOPHAGOGASTRODUODENOSCOPY (EGD) WITH PROPOFOL N/A 01/25/2018   Procedure: ESOPHAGOGASTRODUODENOSCOPY (EGD) WITH PROPOFOL;  Surgeon: Jerene Bears, MD;  Location: [place];  Service: Gastroenterology;  Laterality: N/A;   ESOPHAGOGASTRODUODENOSCOPY (EGD) WITH PROPOFOL N/A 04/10/2019   Procedure: ESOPHAGOGASTRODUODENOSCOPY (EGD) WITH PROPOFOL;  Surgeon: Jerene Bears, MD;  [facility];  Service: Gastroenterology;  Laterality: N/A;   FLEXIBLE SIGMOIDOSCOPY N/A 07/15/2017   Procedure: FLEXIBLE SIGMOIDOSCOPY;  Surgeon: Carol Ada, MD;  Location: [place];  Service: Endoscopy;  Laterality: N/A;   HEMORRHOID BANDING     I & D EXTREMITY Left 06/01/2017   Procedure: IRRIGATION AND DEBRIDEMENT LEFT ARM HEMATOMA WITH LIGATION OF LEFT ARM AV FISTULA;  Surgeon: Elam Dutch, MD;  Location: [place];  Service: Vascular;  Laterality: Left;   I & D EXTREMITY Left 06/14/2017   Procedure: IRRIGATION AND DEBRIDEMENT EXTREMITY;  Surgeon: Katha Cabal, MD;  Location: [facility];  Service: Vascular;  Laterality: Left;   INSERTION OF DIALYSIS CATHETER  05/30/2017   INSERTION OF DIALYSIS CATHETER N/A 05/30/2017   Procedure: INSERTION OF DIALYSIS CATHETER;  Surgeon: Elam Dutch, MD;  Location: [place];  Service: Vascular;  Laterality: N/A;   IR PARACENTESIS  08/30/2017   IR PARACENTESIS  09/29/2017   IR PARACENTESIS  10/28/2017   IR PARACENTESIS  11/09/2017   IR PARACENTESIS  11/16/2017   IR PARACENTESIS  11/28/2017   IR PARACENTESIS  12/01/2017   IR PARACENTESIS  12/06/2017   IR PARACENTESIS  01/03/2018   IR PARACENTESIS  01/23/2018   IR PARACENTESIS  02/07/2018   IR PARACENTESIS  02/21/2018   IR PARACENTESIS  03/06/2018   IR PARACENTESIS  03/17/2018   IR PARACENTESIS  04/04/2018   IR PARACENTESIS  12/28/2018   IR PARACENTESIS  01/08/2019   IR PARACENTESIS  01/23/2019   IR PARACENTESIS   02/01/2019   IR PARACENTESIS  02/19/2019   IR PARACENTESIS  03/01/2019   IR PARACENTESIS  03/15/2019   IR PARACENTESIS  04/03/2019   IR PARACENTESIS  04/12/2019   IR PARACENTESIS  05/01/2019   IR PARACENTESIS  05/08/2019   IR PARACENTESIS  05/24/2019   IR PARACENTESIS  06/12/2019   IR PARACENTESIS  07/09/2019   IR PARACENTESIS  07/27/2019   IR PARACENTESIS  08/09/2019   IR PARACENTESIS  08/21/2019   IR PARACENTESIS  09/17/2019   IR PARACENTESIS  10/05/2019   IR PARACENTESIS  10/29/2019   IR PARACENTESIS  11/08/2019   IR PARACENTESIS  12/12/2019   IR PARACENTESIS  01/03/2020   IR PARACENTESIS  01/10/2020   IR PARACENTESIS  01/17/2020   IR PARACENTESIS  01/24/2020   IR PARACENTESIS  01/31/2020   IR PARACENTESIS  02/07/2020   IR PARACENTESIS  02/21/2020   IR PARACENTESIS  05/21/2020   IR PARACENTESIS  10/10/2020   IR PARACENTESIS  10/28/2020   IR PARACENTESIS  11/18/2020   IR PARACENTESIS  12/02/2020   IR PARACENTESIS  12/11/2020   IR PARACENTESIS  12/18/2020   IR PARACENTESIS  12/30/2020   IR PARACENTESIS  01/06/2021   IR PARACENTESIS  01/13/2021   IR PARACENTESIS  01/20/2021   IR PARACENTESIS  01/27/2021   IR PARACENTESIS  02/03/2021   IR PARACENTESIS  02/10/2021   IR PARACENTESIS  02/17/2021   IR PARACENTESIS  02/24/2021   IR PARACENTESIS  03/10/2021   IR RADIOLOGIST EVAL & MGMT  02/14/2018   IR RADIOLOGIST EVAL & MGMT  02/22/2019   LEFT HEART CATH AND CORONARY ANGIOGRAPHY N/A 02/22/2017   Procedure: LEFT HEART CATH AND CORONARY ANGIOGRAPHY;  Surgeon: Nigel Mormon, MD;  Location: [place] [facility];  Service: Cardiovascular;  Laterality: N/A;   LIGATION OF ARTERIOVENOUS  FISTULA Left 5/0/2774   Procedure: Plication of Left Arm Arteriovenous Fistula;  Surgeon: Elam Dutch, MD;  Location: [facility];  Service: Vascular;  Laterality: Left;   POLYPECTOMY     POLYPECTOMY  01/25/2018   Procedure: POLYPECTOMY;  Surgeon: Jerene Bears, MD;  Location: [place];  Service: Gastroenterology;;   REVISON OF ARTERIOVENOUS  FISTULA Left 08/08/7865   Procedure: PLICATION OF DISTAL ANEURYSMAL SEGEMENT OF LEFT UPPER ARM ARTERIOVENOUS FISTULA;  Surgeon: Elam Dutch, MD;  Location: [place];  Service: Vascular;  Laterality: Left;   REVISON OF ARTERIOVENOUS FISTULA Left 6/72/0947   Procedure: Plication of Left Upper Arm Fistula ;  Surgeon: Waynetta Sandy, MD;  Location: [place];  Service: Vascular;  Laterality: Left;   SKIN GRAFT SPLIT THICKNESS LEG / FOOT Left    SKIN GRAFT SPLIT THICKNESS LEFT ARM DONOR SITE: LEFT ANTERIOR THIGH   SKIN SPLIT GRAFT Left 07/04/2017   Procedure: SKIN GRAFT SPLIT THICKNESS LEFT ARM DONOR SITE: LEFT ANTERIOR THIGH;  Surgeon: Elam Dutch, MD;  Location: [place];  Service: Vascular;  Laterality: Left;   THROMBECTOMY W/ EMBOLECTOMY Left 06/05/2017   Procedure: EXPLORATION OF LEFT ARM FOR BLEEDING; OVERSEWED PROXIMAL FISTULA;  Surgeon: Angelia Mould, MD;  Location: [place];  Service: Vascular;  Laterality: Left;   WOUND EXPLORATION Left 06/03/2017   Procedure: WOUND EXPLORATION WITH WOUND VAC APPLICATION TO LEFT ARM;  Surgeon: Angelia Mould, MD;  Location: [place];  Service: Vascular;  Laterality: Left;    There were no vitals filed for this visit.   Subjective Assessment - 03/12/21 1611     Subjective Pt reports getting out of the hospital on Sunday after having about 8 falls. He reports that he was in the hospital for a week. He reports that his legs still feel weak. Additionally, the pt is SOB during subjective information gathering, and he also adds that he just wants to go to sleep.    Currently in Pain? Yes    Pain Score 10-Worst pain ever    Pain Location Leg    Pain Orientation Right;Left    Pain Descriptors / Indicators Aching;Sharp    Pain Type Chronic pain    Pain Frequency Constant                [facility] PT Assessment - 03/12/21 0001       Observation/Other Assessments   Observations Pt visually exhausted,  pt required assistance to stand in  the lobby. After walking about 4ft with cane, requested wheelchair due to fear of falling.    Focus on Therapeutic Outcomes (FOTO)  33%      Sit to Stand   Comments Unable to perform due to pain and fatigue      Strength   Right Hip Flexion 2+/5    Right Hip Extension --   Unable to assess due to being unable to get into testing position   Right Hip ABduction --   Unable to assess due to being unable to get into testing position   Left Hip Flexion 2+/5    Left Hip Extension --   Unable to assess due to being unable to get into testing position   Left Hip ABduction --   Unable to assess due to being unable to get into testing position   Right Knee Flexion 4/5    Right Knee Extension 4/5    Left Knee Flexion 4/5    Left Knee Extension 4/5    Right Ankle Dorsiflexion 4/5    Right Ankle Plantar Flexion 4/5    Left Ankle Dorsiflexion 4/5    Left Ankle Plantar Flexion 4/5      Special Tests   Other special tests (-) Clonus testing                                     PT Short Term Goals - 01/22/21 1702       PT SHORT TERM GOAL #1   Title STG = LTG               PT Long Term Goals - 03/12/21 1624       PT LONG TERM GOAL #1   Title Patient will be I with final HEP to maintain progress from PT    Baseline patient reports he has not been doing HEP    Time 6    Period Weeks    Status On-going      PT LONG TERM GOAL #2   Title Patient will exhibit hip strength >/= 4/5 MMT and knee strength = 5/5 MMT to improve walking and stair negotiation    Baseline See flowsheet    Time 6    Period Weeks    Status On-going      PT LONG TERM GOAL #3   Title Patient will be able to perform 5xSTS in </= 15 seconds to indicate improved strength and reduced fall risk    Baseline Unable to assess due to pt weakness and fatigue (03/12/2021)    Time 6    Period Weeks    Status On-going      PT LONG TERM GOAL #4   Title Patient will report >/= 55% functional  status on FOTO    Baseline 33% (03/12/2021) 45% on eval    Time 6    Period Weeks    Status On-going                   Plan - 03/12/21 1651     Clinical Impression Statement Pt very limited in the session today due to physical fatigue and weakness. Upon attempting to re-assess objective measures, the pt was unable to assume testing position for hip abduction and extension. Additionally, the pt required maxAx1 when standing in the lobby to walk to the treatment area. After about 23ft of  ambulation with a quad cane, the pt required a wheelchair for further mobilization. He was unable to attempt 5xSTS today due to requiring assistance with standing. In re-assessment of objective measures, the pt declined in every metric, including a significant drop in his FOTO score, demonstrating decreased perceived functional ability. The pt was given a [place] transportation card and was urged to call when he returned to future appointments. Due to pt being far from his functional baseline with reports of 8 falls leading to his hospitalization last week, along with not possessing the functional ability to partake in a therapy session today, the pt is not appropriate for outpatient PT at this time. It is recommended that the pt attend inpatient rehab. If this is not a possibility, it is recommended that the pt attend home health PT to regain his functional strength prior to outpatient PT. Pt is discharged from [facility] at this time.    Personal Factors and Comorbidities Fitness;Time since onset of injury/illness/exacerbation;Past/Current Experience;Comorbidity 3+    Comorbidities COPD, ESRD on dialysis, HTN, Ascites, cirrhosis, CKD, A-fib    Examination-Activity Limitations Locomotion Level;Transfers;Stairs;Stand;Lift;Carry;Dressing;Bathing    Examination-Participation Restrictions Meal Prep;Cleaning;Community Activity;Occupation;Shop;Laundry;Yard Work    Stability/Clinical Decision Making  Evolving/Moderate complexity    Clinical Decision Making Moderate    PT Next Visit Plan Pt is discharged from [place] and Agree with Plan of Care Patient             Patient will benefit from skilled therapeutic intervention in order to improve the following deficits and impairments:  Abnormal gait, Decreased range of motion, Difficulty walking, Decreased strength, Decreased balance, Impaired flexibility, Decreased activity tolerance  Visit Diagnosis: Muscle weakness (generalized)  Other abnormalities of gait and mobility  Repeated falls     Problem List Patient Active Problem List   Diagnosis Date Noted   Hypoglycemia 03/03/2021   AMS (altered mental status) 03/03/2021   Acute encephalopathy 03/02/2021   Tylenol overdose 09/07/2020   DNR (do not resuscitate)    CAP (community acquired pneumonia) 02/09/2020   Leukocytosis 01/19/2020   Tobacco use 01/19/2020   Acute pulmonary edema ([place]) 12/21/2019   Acetaminophen overdose 10/27/2019   ESRD (end stage renal disease) ([place])    Goals of care, counseling/discussion    Hypoxia 10/04/2019   Advanced care planning/counseling discussion    Renal failure 09/26/2019   Dyspnea 06/09/2019   Acquired thrombophilia ([place]) 06/05/2019   A-fib ([place]) 05/30/2019   Atrial fibrillation with RVR ([place]) 05/29/2019   Melena    Pressure injury of skin 03/09/2019   Abdominal distention    Volume overload 12/28/2018   Sepsis ([place]) 09/12/2018   Coronary artery disease involving native coronary artery of native heart without angina pectoris 03/11/2018   Benign neoplasm of cecum    Benign neoplasm of ascending colon    Benign neoplasm of descending colon    Benign neoplasm of rectum    AF (paroxysmal atrial fibrillation) ([place]) 01/23/2018   Hx of colonic polyps 01/20/2018   End-stage renal disease on hemodialysis ([place]) 11/21/2017   GERD (gastroesophageal reflux disease) 11/16/2017   Decompensated  hepatic cirrhosis ([place]) 11/15/2017   Palliative care by specialist    Hyponatremia 11/04/2017   SBP (spontaneous bacterial peritonitis) ([place]) 10/30/2017   Liver disease, chronic 10/30/2017   SOB (shortness of breath)    Abdominal pain 10/28/2017   Upper airway cough syndrome with flattening on f/v loop 10/13/17 c/w vcd 10/17/2017  Elevated diaphragm 10/13/2017   Ileus (HCC) 09/29/2017   QT prolongation 09/29/2017   Malnutrition of moderate degree 09/29/2017   Sinus congestion 09/03/2017   Symptomatic anemia 09/02/2017   Cirrhosis of liver with ascites ([place]) 09/02/2017   Left bundle branch block 09/02/2017   Mitral stenosis 09/02/2017   Hematochezia 07/15/2017   Wide-complex tachycardia ([place])    Endotracheally intubated    ESRD on dialysis ([place]) 07/04/2017   CKD (chronic kidney disease) stage V requiring chronic dialysis ([place]) 06/18/2017   History of Cocaine abuse ([place]) 06/18/2017   Hypertension 06/18/2017   Infection of AV graft for dialysis ([place]) 06/18/2017   Anxiety 06/18/2017   Anemia due to chronic kidney disease 06/18/2017   Atypical atrial flutter ([place]) 06/18/2017   Personality disorder ([place]) 06/13/2017   Cellulitis 06/12/2017   Adjustment disorder with mixed anxiety and depressed mood 06/10/2017   Suicidal ideation 06/10/2017   Arm wound, left, sequela 06/10/2017   Dyspnea on exertion 05/29/2017   Tachycardia 05/29/2017   Hyperkalemia 96/28/3662   Acute metabolic encephalopathy    Anemia 04/23/2017   Ascites 04/23/2017   COPD (chronic obstructive pulmonary disease) ([place]) 04/23/2017   Acute on chronic respiratory failure with hypoxia ([place]) 03/25/2017   Arrhythmia 03/25/2017   COPD GOLD 0 with flattening on inps f/v  09/27/2016   Essential hypertension 09/27/2016   Fluid overload 08/30/2016   COPD exacerbation ([place]) 08/17/2016   Hypertensive urgency 08/17/2016   Problem with dialysis access ([place]) 07/23/2016   Chronic hepatitis B ([place]) 03/05/2014   Chronic  hepatitis C without hepatic coma ([place]) 03/05/2014   Internal hemorrhoids with bleeding, swelling and itching 03/05/2014   Thrombocytopenia ([place]) 03/05/2014   Chest pain 02/27/2014   Alcohol abuse 04/14/2009   Nicotine dependence, cigarettes, uncomplicated 94/76/5465   GANGLION CYST 04/14/2009    Vanessa Dunreith, PT, DPT 03/12/21 6:11 PM   [facility] [address] [place], [place], [address] Phone: [phone]   Fax:  [phone]  Name: [name] MRN: [id number] Date of Birth: 1961/03/31

## 2021-03-12 NOTE — Telephone Encounter (Signed)
Copied from [place] [id number]. Topic: General - Other >> Mar 10, 2021  3:17 PM Leward Quan A wrote: Reason for CRM: Patient would like a call from Dr Margarita Rana say that he need to discuss some health issues and that he need to have a paracentesis. Asking for a call back please at  Ph# [phone]

## 2021-03-13 ENCOUNTER — Other Ambulatory Visit: Payer: Self-pay | Admitting: Family Medicine

## 2021-03-13 ENCOUNTER — Emergency Department (HOSPITAL_COMMUNITY): Payer: Medicare Other

## 2021-03-13 ENCOUNTER — Emergency Department (HOSPITAL_COMMUNITY)
Admission: EM | Admit: 2021-03-13 | Discharge: 2021-03-13 | Disposition: A | Discharge status: Home / Self Care | Payer: Medicare Other | Source: Home / Self Care | Attending: Emergency Medicine | Admitting: Emergency Medicine

## 2021-03-13 DIAGNOSIS — S0990XA Unspecified injury of head, initial encounter: Secondary | ICD-10-CM | POA: Insufficient documentation

## 2021-03-13 DIAGNOSIS — I120 Hypertensive chronic kidney disease with stage 5 chronic kidney disease or end stage renal disease: Secondary | ICD-10-CM | POA: Insufficient documentation

## 2021-03-13 DIAGNOSIS — J9601 Acute respiratory failure with hypoxia: Secondary | ICD-10-CM | POA: Diagnosis not present

## 2021-03-13 DIAGNOSIS — Z85038 Personal history of other malignant neoplasm of large intestine: Secondary | ICD-10-CM | POA: Insufficient documentation

## 2021-03-13 DIAGNOSIS — J441 Chronic obstructive pulmonary disease with (acute) exacerbation: Secondary | ICD-10-CM | POA: Insufficient documentation

## 2021-03-13 DIAGNOSIS — R109 Unspecified abdominal pain: Secondary | ICD-10-CM | POA: Insufficient documentation

## 2021-03-13 DIAGNOSIS — Y92410 Unspecified street and highway as the place of occurrence of the external cause: Secondary | ICD-10-CM | POA: Insufficient documentation

## 2021-03-13 DIAGNOSIS — R0600 Dyspnea, unspecified: Secondary | ICD-10-CM | POA: Diagnosis not present

## 2021-03-13 DIAGNOSIS — Z992 Dependence on renal dialysis: Secondary | ICD-10-CM | POA: Insufficient documentation

## 2021-03-13 DIAGNOSIS — N186 End stage renal disease: Secondary | ICD-10-CM | POA: Insufficient documentation

## 2021-03-13 DIAGNOSIS — Z20822 Contact with and (suspected) exposure to covid-19: Secondary | ICD-10-CM | POA: Insufficient documentation

## 2021-03-13 DIAGNOSIS — D631 Anemia in chronic kidney disease: Secondary | ICD-10-CM | POA: Insufficient documentation

## 2021-03-13 DIAGNOSIS — F17210 Nicotine dependence, cigarettes, uncomplicated: Secondary | ICD-10-CM | POA: Insufficient documentation

## 2021-03-13 DIAGNOSIS — I2510 Atherosclerotic heart disease of native coronary artery without angina pectoris: Secondary | ICD-10-CM | POA: Insufficient documentation

## 2021-03-13 DIAGNOSIS — M549 Dorsalgia, unspecified: Secondary | ICD-10-CM | POA: Insufficient documentation

## 2021-03-13 DIAGNOSIS — R0781 Pleurodynia: Secondary | ICD-10-CM | POA: Insufficient documentation

## 2021-03-13 DIAGNOSIS — Z79899 Other long term (current) drug therapy: Secondary | ICD-10-CM | POA: Insufficient documentation

## 2021-03-13 DIAGNOSIS — T1490XA Injury, unspecified, initial encounter: Secondary | ICD-10-CM

## 2021-03-13 LAB — COMPREHENSIVE METABOLIC PANEL
ALT: 14 U/L (ref 0–44)
AST: 29 U/L (ref 15–41)
Albumin: 2.1 g/dL — ABNORMAL LOW (ref 3.5–5.0)
Alkaline Phosphatase: 187 U/L — ABNORMAL HIGH (ref 38–126)
Anion gap: 14 (ref 5–15)
BUN: 25 mg/dL — ABNORMAL HIGH (ref 6–20)
CO2: 25 mmol/L (ref 22–32)
Calcium: 7.3 mg/dL — ABNORMAL LOW (ref 8.9–10.3)
Chloride: 93 mmol/L — ABNORMAL LOW (ref 98–111)
Creatinine, Ser: 8.01 mg/dL — ABNORMAL HIGH (ref 0.61–1.24)
GFR, Estimated: 7 mL/min — ABNORMAL LOW (ref >60)
Glucose, Bld: 83 mg/dL (ref 70–99)
Potassium: 4.3 mmol/L (ref 3.5–5.1)
Sodium: 132 mmol/L — ABNORMAL LOW (ref 135–145)
Total Bilirubin: 0.8 mg/dL (ref 0.3–1.2)
Total Protein: 5.6 g/dL — ABNORMAL LOW (ref 6.5–8.1)

## 2021-03-13 LAB — CBC
HCT: 32.6 % — ABNORMAL LOW (ref 39.0–52.0)
Hemoglobin: 11.1 g/dL — ABNORMAL LOW (ref 13.0–17.0)
MCH: 27.3 pg (ref 26.0–34.0)
MCHC: 34 g/dL (ref 30.0–36.0)
MCV: 80.3 fL (ref 80.0–100.0)
Platelets: 217 10*3/uL (ref 150–400)
RBC: 4.06 MIL/uL — ABNORMAL LOW (ref 4.22–5.81)
RDW: 14.6 % (ref 11.5–15.5)
WBC: 8.7 10*3/uL (ref 4.0–10.5)
nRBC: 0 % (ref 0.0–0.2)

## 2021-03-13 LAB — I-STAT CHEM 8, ED
BUN: 27 mg/dL — ABNORMAL HIGH (ref 6–20)
Calcium, Ion: 0.91 mmol/L — ABNORMAL LOW (ref 1.15–1.40)
Chloride: 93 mmol/L — ABNORMAL LOW (ref 98–111)
Creatinine, Ser: 7.8 mg/dL — ABNORMAL HIGH (ref 0.61–1.24)
Glucose, Bld: 86 mg/dL (ref 70–99)
HCT: 33 % — ABNORMAL LOW (ref 39.0–52.0)
Hemoglobin: 11.2 g/dL — ABNORMAL LOW (ref 13.0–17.0)
Potassium: 3.7 mmol/L (ref 3.5–5.1)
Sodium: 132 mmol/L — ABNORMAL LOW (ref 135–145)
TCO2: 28 mmol/L (ref 22–32)

## 2021-03-13 LAB — RESP PANEL BY RT-PCR (FLU A&B, COVID) ARPGX2
Influenza A by PCR: NEGATIVE
Influenza B by PCR: NEGATIVE
SARS Coronavirus 2 by RT PCR: NEGATIVE

## 2021-03-13 MED ORDER — ACETAMINOPHEN 325 MG PO TABS
650.0000 mg | ORAL_TABLET | ORAL | Status: DC | PRN
Start: 2021-03-13 — End: 2021-03-13

## 2021-03-13 MED ORDER — SODIUM CHLORIDE 0.9 % IV BOLUS
500.0000 mL | Freq: Once | INTRAVENOUS | Status: AC
  Administered 2021-03-13: 500 mL via INTRAVENOUS

## 2021-03-13 NOTE — ED Notes (Signed)
Pt refuses to get undressed

## 2021-03-13 NOTE — ED Notes (Signed)
Patient transported to CT by this RN 

## 2021-03-13 NOTE — ED Notes (Signed)
Pt A&Ox4, but slow to answer.
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
## 2021-03-13 NOTE — Progress Notes (Signed)
[name], I received a message from PT stating this patient was unable to participate in outpatient PT but will benefit from either inpatient rehab or home health.  He had a visit with Tonya on 03/11/2021; can you please see if that visit will suffice to be used as a referral for home health aide as my last visit with him was greater than 90 days ago-12/10/2020 (per insurance guidelines).  If that visit will not suffice I am happy to see him at a telehealth visit so I can place referral to home health.  Thank you
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
## 2021-03-13 NOTE — ED Notes (Signed)
Notified Dr Langston Masker of pt's bp and he is at bedside trying to do an [place] IV

## 2021-03-15 ENCOUNTER — Emergency Department (HOSPITAL_COMMUNITY): Payer: Medicare Other

## 2021-03-15 ENCOUNTER — Encounter (HOSPITAL_COMMUNITY): Payer: Self-pay

## 2021-03-15 ENCOUNTER — Inpatient Hospital Stay (HOSPITAL_COMMUNITY)
Admission: EM | Admit: 2021-03-15 | Discharge: 2021-03-17 | DRG: 189 | Discharge status: Against Medical Advice | Payer: Medicare Other | Attending: Internal Medicine | Admitting: Internal Medicine

## 2021-03-15 DIAGNOSIS — E8770 Fluid overload, unspecified: Secondary | ICD-10-CM | POA: Diagnosis present

## 2021-03-15 DIAGNOSIS — R9431 Abnormal electrocardiogram [ECG] [EKG]: Secondary | ICD-10-CM | POA: Diagnosis present

## 2021-03-15 DIAGNOSIS — Y92410 Unspecified street and highway as the place of occurrence of the external cause: Secondary | ICD-10-CM

## 2021-03-15 DIAGNOSIS — Z560 Unemployment, unspecified: Secondary | ICD-10-CM

## 2021-03-15 DIAGNOSIS — I9589 Other hypotension: Secondary | ICD-10-CM | POA: Diagnosis present

## 2021-03-15 DIAGNOSIS — Z86010 Personal history of colonic polyps: Secondary | ICD-10-CM

## 2021-03-15 DIAGNOSIS — Z808 Family history of malignant neoplasm of other organs or systems: Secondary | ICD-10-CM

## 2021-03-15 DIAGNOSIS — I2510 Atherosclerotic heart disease of native coronary artery without angina pectoris: Secondary | ICD-10-CM | POA: Diagnosis present

## 2021-03-15 DIAGNOSIS — Z955 Presence of coronary angioplasty implant and graft: Secondary | ICD-10-CM

## 2021-03-15 DIAGNOSIS — Z885 Allergy status to narcotic agent status: Secondary | ICD-10-CM

## 2021-03-15 DIAGNOSIS — Z5329 Procedure and treatment not carried out because of patient's decision for other reasons: Secondary | ICD-10-CM | POA: Diagnosis present

## 2021-03-15 DIAGNOSIS — I252 Old myocardial infarction: Secondary | ICD-10-CM

## 2021-03-15 DIAGNOSIS — R778 Other specified abnormalities of plasma proteins: Secondary | ICD-10-CM | POA: Diagnosis present

## 2021-03-15 DIAGNOSIS — Z89021 Acquired absence of right finger(s): Secondary | ICD-10-CM

## 2021-03-15 DIAGNOSIS — J9601 Acute respiratory failure with hypoxia: Secondary | ICD-10-CM | POA: Diagnosis not present

## 2021-03-15 DIAGNOSIS — Z8249 Family history of ischemic heart disease and other diseases of the circulatory system: Secondary | ICD-10-CM

## 2021-03-15 DIAGNOSIS — Z87442 Personal history of urinary calculi: Secondary | ICD-10-CM

## 2021-03-15 DIAGNOSIS — Z66 Do not resuscitate: Secondary | ICD-10-CM | POA: Diagnosis present

## 2021-03-15 DIAGNOSIS — G9341 Metabolic encephalopathy: Secondary | ICD-10-CM | POA: Diagnosis present

## 2021-03-15 DIAGNOSIS — Z801 Family history of malignant neoplasm of trachea, bronchus and lung: Secondary | ICD-10-CM

## 2021-03-15 DIAGNOSIS — J441 Chronic obstructive pulmonary disease with (acute) exacerbation: Secondary | ICD-10-CM | POA: Diagnosis present

## 2021-03-15 DIAGNOSIS — F10129 Alcohol abuse with intoxication, unspecified: Secondary | ICD-10-CM | POA: Diagnosis present

## 2021-03-15 DIAGNOSIS — F1190 Opioid use, unspecified, uncomplicated: Secondary | ICD-10-CM | POA: Diagnosis present

## 2021-03-15 DIAGNOSIS — N186 End stage renal disease: Secondary | ICD-10-CM | POA: Diagnosis present

## 2021-03-15 DIAGNOSIS — B181 Chronic viral hepatitis B without delta-agent: Secondary | ICD-10-CM | POA: Diagnosis present

## 2021-03-15 DIAGNOSIS — Z9119 Patient's noncompliance with other medical treatment and regimen: Secondary | ICD-10-CM

## 2021-03-15 DIAGNOSIS — Z79899 Other long term (current) drug therapy: Secondary | ICD-10-CM

## 2021-03-15 DIAGNOSIS — Z886 Allergy status to analgesic agent status: Secondary | ICD-10-CM

## 2021-03-15 DIAGNOSIS — I700 Atherosclerosis of aorta: Secondary | ICD-10-CM | POA: Diagnosis present

## 2021-03-15 DIAGNOSIS — N2581 Secondary hyperparathyroidism of renal origin: Secondary | ICD-10-CM | POA: Diagnosis present

## 2021-03-15 DIAGNOSIS — Z800 Family history of malignant neoplasm of digestive organs: Secondary | ICD-10-CM

## 2021-03-15 DIAGNOSIS — Z825 Family history of asthma and other chronic lower respiratory diseases: Secondary | ICD-10-CM

## 2021-03-15 DIAGNOSIS — K7031 Alcoholic cirrhosis of liver with ascites: Secondary | ICD-10-CM | POA: Diagnosis present

## 2021-03-15 DIAGNOSIS — F17210 Nicotine dependence, cigarettes, uncomplicated: Secondary | ICD-10-CM | POA: Diagnosis present

## 2021-03-15 DIAGNOSIS — J90 Pleural effusion, not elsewhere classified: Secondary | ICD-10-CM | POA: Diagnosis present

## 2021-03-15 DIAGNOSIS — R0600 Dyspnea, unspecified: Secondary | ICD-10-CM

## 2021-03-15 DIAGNOSIS — Z888 Allergy status to other drugs, medicaments and biological substances status: Secondary | ICD-10-CM

## 2021-03-15 DIAGNOSIS — D631 Anemia in chronic kidney disease: Secondary | ICD-10-CM | POA: Diagnosis present

## 2021-03-15 DIAGNOSIS — Z992 Dependence on renal dialysis: Secondary | ICD-10-CM

## 2021-03-15 DIAGNOSIS — I482 Chronic atrial fibrillation, unspecified: Secondary | ICD-10-CM | POA: Diagnosis present

## 2021-03-15 DIAGNOSIS — Z20822 Contact with and (suspected) exposure to covid-19: Secondary | ICD-10-CM | POA: Diagnosis present

## 2021-03-15 DIAGNOSIS — K219 Gastro-esophageal reflux disease without esophagitis: Secondary | ICD-10-CM | POA: Diagnosis present

## 2021-03-15 DIAGNOSIS — J449 Chronic obstructive pulmonary disease, unspecified: Secondary | ICD-10-CM | POA: Diagnosis present

## 2021-03-15 DIAGNOSIS — I120 Hypertensive chronic kidney disease with stage 5 chronic kidney disease or end stage renal disease: Secondary | ICD-10-CM | POA: Diagnosis present

## 2021-03-15 DIAGNOSIS — K7460 Unspecified cirrhosis of liver: Secondary | ICD-10-CM | POA: Diagnosis present

## 2021-03-15 DIAGNOSIS — Z9115 Patient's noncompliance with renal dialysis: Secondary | ICD-10-CM

## 2021-03-15 LAB — CBC WITH DIFFERENTIAL/PLATELET
Abs Immature Granulocytes: 0.04 10*3/uL (ref 0.00–0.07)
Basophils Absolute: 0.1 10*3/uL (ref 0.0–0.1)
Basophils Relative: 1 %
Eosinophils Absolute: 0.2 10*3/uL (ref 0.0–0.5)
Eosinophils Relative: 2 %
HCT: 30.5 % — ABNORMAL LOW (ref 39.0–52.0)
Hemoglobin: 10.5 g/dL — ABNORMAL LOW (ref 13.0–17.0)
Immature Granulocytes: 0 %
Lymphocytes Relative: 10 %
Lymphs Abs: 1 10*3/uL (ref 0.7–4.0)
MCH: 27 pg (ref 26.0–34.0)
MCHC: 34.4 g/dL (ref 30.0–36.0)
MCV: 78.4 fL — ABNORMAL LOW (ref 80.0–100.0)
Monocytes Absolute: 0.7 10*3/uL (ref 0.1–1.0)
Monocytes Relative: 6 %
Neutro Abs: 8.4 10*3/uL — ABNORMAL HIGH (ref 1.7–7.7)
Neutrophils Relative %: 81 %
Platelets: 204 10*3/uL (ref 150–400)
RBC: 3.89 MIL/uL — ABNORMAL LOW (ref 4.22–5.81)
RDW: 14.5 % (ref 11.5–15.5)
WBC: 10.4 10*3/uL (ref 4.0–10.5)
nRBC: 0 % (ref 0.0–0.2)

## 2021-03-15 LAB — CK: Total CK: 194 U/L (ref 49–397)

## 2021-03-15 LAB — COMPREHENSIVE METABOLIC PANEL
ALT: 15 U/L (ref 0–44)
AST: 25 U/L (ref 15–41)
Albumin: 2.3 g/dL — ABNORMAL LOW (ref 3.5–5.0)
Alkaline Phosphatase: 205 U/L — ABNORMAL HIGH (ref 38–126)
Anion gap: 16 — ABNORMAL HIGH (ref 5–15)
BUN: 33 mg/dL — ABNORMAL HIGH (ref 6–20)
CO2: 26 mmol/L (ref 22–32)
Calcium: 8.3 mg/dL — ABNORMAL LOW (ref 8.9–10.3)
Chloride: 90 mmol/L — ABNORMAL LOW (ref 98–111)
Creatinine, Ser: 9.96 mg/dL — ABNORMAL HIGH (ref 0.61–1.24)
GFR, Estimated: 6 mL/min — ABNORMAL LOW (ref >60)
Glucose, Bld: 109 mg/dL — ABNORMAL HIGH (ref 70–99)
Potassium: 4.2 mmol/L (ref 3.5–5.1)
Sodium: 132 mmol/L — ABNORMAL LOW (ref 135–145)
Total Bilirubin: 0.8 mg/dL (ref 0.3–1.2)
Total Protein: 6.1 g/dL — ABNORMAL LOW (ref 6.5–8.1)

## 2021-03-15 LAB — I-STAT VENOUS BLOOD GAS, ED
Acid-Base Excess: 4 mmol/L — ABNORMAL HIGH (ref 0.0–2.0)
Bicarbonate: 29.2 mmol/L — ABNORMAL HIGH (ref 20.0–28.0)
Calcium, Ion: 0.92 mmol/L — ABNORMAL LOW (ref 1.15–1.40)
HCT: 34 % — ABNORMAL LOW (ref 39.0–52.0)
Hemoglobin: 11.6 g/dL — ABNORMAL LOW (ref 13.0–17.0)
O2 Saturation: 99 %
Potassium: 4.3 mmol/L (ref 3.5–5.1)
Sodium: 130 mmol/L — ABNORMAL LOW (ref 135–145)
TCO2: 31 mmol/L (ref 22–32)
pCO2, Ven: 47.5 mmHg (ref 44.0–60.0)
pH, Ven: 7.397 (ref 7.250–7.430)
pO2, Ven: 138 mmHg — ABNORMAL HIGH (ref 32.0–45.0)

## 2021-03-15 LAB — BASIC METABOLIC PANEL
Anion gap: 11 (ref 5–15)
BUN: 20 mg/dL (ref 6–20)
CO2: 29 mmol/L (ref 22–32)
Calcium: 8.2 mg/dL — ABNORMAL LOW (ref 8.9–10.3)
Chloride: 95 mmol/L — ABNORMAL LOW (ref 98–111)
Creatinine, Ser: 6.68 mg/dL — ABNORMAL HIGH (ref 0.61–1.24)
GFR, Estimated: 9 mL/min — ABNORMAL LOW (ref >60)
Glucose, Bld: 91 mg/dL (ref 70–99)
Potassium: 4.3 mmol/L (ref 3.5–5.1)
Sodium: 135 mmol/L (ref 135–145)

## 2021-03-15 LAB — RESP PANEL BY RT-PCR (FLU A&B, COVID) ARPGX2
Influenza A by PCR: NEGATIVE
Influenza B by PCR: NEGATIVE
SARS Coronavirus 2 by RT PCR: NEGATIVE

## 2021-03-15 LAB — CBC
HCT: 32.3 % — ABNORMAL LOW (ref 39.0–52.0)
Hemoglobin: 11 g/dL — ABNORMAL LOW (ref 13.0–17.0)
MCH: 27.2 pg (ref 26.0–34.0)
MCHC: 34.1 g/dL (ref 30.0–36.0)
MCV: 79.8 fL — ABNORMAL LOW (ref 80.0–100.0)
Platelets: 231 10*3/uL (ref 150–400)
RBC: 4.05 MIL/uL — ABNORMAL LOW (ref 4.22–5.81)
RDW: 14.5 % (ref 11.5–15.5)
WBC: 10.8 10*3/uL — ABNORMAL HIGH (ref 4.0–10.5)
nRBC: 0 % (ref 0.0–0.2)

## 2021-03-15 LAB — TROPONIN I (HIGH SENSITIVITY)
Troponin I (High Sensitivity): 22 ng/L — ABNORMAL HIGH (ref <18)
Troponin I (High Sensitivity): 21 ng/L — ABNORMAL HIGH (ref <18)
Troponin I (High Sensitivity): 23 ng/L — ABNORMAL HIGH (ref <18)

## 2021-03-15 LAB — BRAIN NATRIURETIC PEPTIDE
B Natriuretic Peptide: 668.7 pg/mL — ABNORMAL HIGH (ref 0.0–100.0)
B Natriuretic Peptide: 1181.6 pg/mL — ABNORMAL HIGH (ref 0.0–100.0)

## 2021-03-15 MED ORDER — LIDOCAINE-PRILOCAINE 2.5-2.5 % EX CREA
1.0000 "application " | TOPICAL_CREAM | CUTANEOUS | Status: DC | PRN
  Filled 2021-03-15: qty 5

## 2021-03-15 MED ORDER — ALBUTEROL SULFATE HFA 108 (90 BASE) MCG/ACT IN AERS
4.0000 | INHALATION_SPRAY | Freq: Once | RESPIRATORY_TRACT | Status: DC
  Filled 2021-03-15: qty 6.7

## 2021-03-15 MED ORDER — PENTAFLUOROPROP-TETRAFLUOROETH EX AERO
1.0000 "application " | INHALATION_SPRAY | CUTANEOUS | Status: DC | PRN
  Filled 2021-03-15: qty 116

## 2021-03-15 MED ORDER — SODIUM CHLORIDE 0.9 % IV SOLN: 100.0000 mL | INTRAVENOUS | Status: DC | PRN

## 2021-03-15 MED ORDER — ALBUMIN HUMAN 25 % IV SOLN
INTRAVENOUS | Status: AC
  Administered 2021-03-15: 25 g
  Filled 2021-03-15: qty 100

## 2021-03-15 MED ORDER — LIDOCAINE HCL (PF) 1 % IJ SOLN: 5.0000 mL | INTRAMUSCULAR | Status: DC | PRN

## 2021-03-15 MED ORDER — MIDODRINE HCL 5 MG PO TABS
ORAL_TABLET | ORAL | Status: AC
  Administered 2021-03-15: 10 mg
  Filled 2021-03-15: qty 2

## 2021-03-15 MED ORDER — CHLORHEXIDINE GLUCONATE CLOTH 2 % EX PADS
6.0000 | MEDICATED_PAD | Freq: Every day | CUTANEOUS | Status: DC
  Administered 2021-03-17: 6 via TOPICAL

## 2021-03-15 MED ORDER — IPRATROPIUM BROMIDE HFA 17 MCG/ACT IN AERS
2.0000 | INHALATION_SPRAY | Freq: Once | RESPIRATORY_TRACT | Status: DC
  Filled 2021-03-15: qty 12.9

## 2021-03-15 NOTE — ED Provider Notes (Signed)
[facility] Emergency Department Provider Note MRN:  [id number]  Arrival date & time: 03/15/21     Chief Complaint   SOB  History of Present Illness   [name] is a 60 y.o. year-old male with a history of COPD, CAD, ESRD presenting to the ED with chief complaint of SOB.  Feeling very short of breath this evening, reportedly satting 80% on room air with EMS.  Missed dialysis on Friday.  Endorsing pain all over.  I was unable to obtain an accurate HPI, PMH, or ROS due to the patient's respiratory distress.  Level 5 caveat.  Review of Systems  Positive for shortness of breath.  Patient's Health History    Past Medical History:  Diagnosis Date   Anemia    Anxiety    Arthritis    left shoulder   Arthritis    Atherosclerosis of aorta (HCC)    Cardiomegaly    Chest pain    DATE UNKNOWN, C/O PERIODICALLY   Chest pain    Cocaine abuse (HCC)    COPD (chronic obstructive pulmonary disease) (HCC)    COPD exacerbation ([place]) 08/17/2016   Coronary artery disease    stent 02/22/17   Coronary artery disease    Dysrhythmia    ESRD (end stage renal disease) ([place])    ESRD (end stage renal disease) on dialysis ([place])    "[name]; M-W-F" (07/04/2017)   GERD (gastroesophageal reflux disease)    DATE UNKNOWN   GERD (gastroesophageal reflux disease)    Hemorrhoids    Hepatitis B    Hepatitis B, chronic (HCC)    Hepatitis C    History of kidney stones    Hyperkalemia    Hypertension    Kidney failure    Metabolic bone disease    Patient denies   Mitral stenosis    Myocardial infarction [facility])    Pneumonia    Pulmonary edema    Renal disorder    Solitary rectal ulcer syndrome 07/2017   at flex sig for rectal bleeding   Solitary rectal ulcer syndrome    Tubular adenoma of colon     Past Surgical History:  Procedure Laterality Date   A/V FISTULAGRAM Left 05/26/2017   Procedure: A/V FISTULAGRAM;  Surgeon: Conrad [place], MD;  Location: [place] [facility];  Service: Cardiovascular;  Laterality: Left;   A/V FISTULAGRAM Right 11/18/2017   Procedure: A/V FISTULAGRAM - Right Arm;  Surgeon: Elam Dutch, MD;  Location: [place] [facility];  Service: Cardiovascular;  Laterality: Right;   AMPUTATION Right 09/04/2020   Procedure: RIGHT INDEX AND RIGHT RING FINGER AMPUTATION DIGIT;  Surgeon: Leanora Cover, MD;  Location: [place];  Service: Orthopedics;  Laterality: Right;   AMPUTATION Right 11/13/2020   Procedure: RIGHT INDEX FINGER AMPUTATION AND RIGHT RING FINGER AMPUTATION;  Surgeon: Leanora Cover, MD;  Location: [place];  Service: Orthopedics;  Laterality: Right;   APPLICATION OF WOUND VAC Left 06/14/2017   Procedure: APPLICATION OF WOUND VAC;  Surgeon: Katha Cabal, MD;  Location: [facility];  Service: Vascular;  Laterality: Left;   AV FISTULA PLACEMENT  2012   BELIEVED WAS PLACED IN JUNE   AV FISTULA PLACEMENT Right 08/09/2017   Procedure: Creation Right arm ARTERIOVENOUS BRACHIOCEPOHALIC FISTULA;  Surgeon: Elam Dutch, MD;  Location: [facility];  Service: Vascular;  Laterality: Right;   AV FISTULA PLACEMENT Right 11/22/2017   Procedure: INSERTION OF ARTERIOVENOUS (AV) GORE-TEX GRAFT RIGHT UPPER ARM;  Surgeon: Oneida Alar,  Jessy Oto, MD;  Location: [place];  Service: Vascular;  Laterality: Right;   BIOPSY  01/25/2018   Procedure: BIOPSY;  Surgeon: Jerene Bears, MD;  Location: [place];  Service: Gastroenterology;;   BIOPSY  04/10/2019   Procedure: BIOPSY;  Surgeon: Jerene Bears, MD;  Location: [facility];  Service: Gastroenterology;;   COLONOSCOPY     COLONOSCOPY WITH PROPOFOL N/A 01/25/2018   Procedure: COLONOSCOPY WITH PROPOFOL;  Surgeon: Jerene Bears, MD;  Location: [place];  Service: Gastroenterology;  Laterality: N/A;   CORONARY STENT INTERVENTION N/A 02/22/2017   Procedure: CORONARY STENT INTERVENTION;  Surgeon: Nigel Mormon, MD;  Location: [place] [facility];  Service: Cardiovascular;  Laterality: N/A;    ESOPHAGOGASTRODUODENOSCOPY (EGD) WITH PROPOFOL N/A 01/25/2018   Procedure: ESOPHAGOGASTRODUODENOSCOPY (EGD) WITH PROPOFOL;  Surgeon: Jerene Bears, MD;  Location: [place];  Service: Gastroenterology;  Laterality: N/A;   ESOPHAGOGASTRODUODENOSCOPY (EGD) WITH PROPOFOL N/A 04/10/2019   Procedure: ESOPHAGOGASTRODUODENOSCOPY (EGD) WITH PROPOFOL;  Surgeon: Jerene Bears, MD;  Location: [facility];  Service: Gastroenterology;  Laterality: N/A;   FLEXIBLE SIGMOIDOSCOPY N/A 07/15/2017   Procedure: FLEXIBLE SIGMOIDOSCOPY;  Surgeon: Carol Ada, MD;  Location: [place];  Service: Endoscopy;  Laterality: N/A;   HEMORRHOID BANDING     I & D EXTREMITY Left 06/01/2017   Procedure: IRRIGATION AND DEBRIDEMENT LEFT ARM HEMATOMA WITH LIGATION OF LEFT ARM AV FISTULA;  Surgeon: Elam Dutch, MD;  Location: [place];  Service: Vascular;  Laterality: Left;   I & D EXTREMITY Left 06/14/2017   Procedure: IRRIGATION AND DEBRIDEMENT EXTREMITY;  Surgeon: Katha Cabal, MD;  Location: [facility];  Service: Vascular;  Laterality: Left;   INSERTION OF DIALYSIS CATHETER  05/30/2017   INSERTION OF DIALYSIS CATHETER N/A 05/30/2017   Procedure: INSERTION OF DIALYSIS CATHETER;  Surgeon: Elam Dutch, MD;  Location: [place];  Service: Vascular;  Laterality: N/A;   IR PARACENTESIS  08/30/2017   IR PARACENTESIS  09/29/2017   IR PARACENTESIS  10/28/2017   IR PARACENTESIS  11/09/2017   IR PARACENTESIS  11/16/2017   IR PARACENTESIS  11/28/2017   IR PARACENTESIS  12/01/2017   IR PARACENTESIS  12/06/2017   IR PARACENTESIS  01/03/2018   IR PARACENTESIS  01/23/2018   IR PARACENTESIS  02/07/2018   IR PARACENTESIS  02/21/2018   IR PARACENTESIS  03/06/2018   IR PARACENTESIS  03/17/2018   IR PARACENTESIS  04/04/2018   IR PARACENTESIS  12/28/2018   IR PARACENTESIS  01/08/2019   IR PARACENTESIS  01/23/2019   IR PARACENTESIS  02/01/2019   IR PARACENTESIS  02/19/2019   IR PARACENTESIS  03/01/2019   IR PARACENTESIS  03/15/2019   IR PARACENTESIS   04/03/2019   IR PARACENTESIS  04/12/2019   IR PARACENTESIS  05/01/2019   IR PARACENTESIS  05/08/2019   IR PARACENTESIS  05/24/2019   IR PARACENTESIS  06/12/2019   IR PARACENTESIS  07/09/2019   IR PARACENTESIS  07/27/2019   IR PARACENTESIS  08/09/2019   IR PARACENTESIS  08/21/2019   IR PARACENTESIS  09/17/2019   IR PARACENTESIS  10/05/2019   IR PARACENTESIS  10/29/2019   IR PARACENTESIS  11/08/2019   IR PARACENTESIS  12/12/2019   IR PARACENTESIS  01/03/2020   IR PARACENTESIS  01/10/2020   IR PARACENTESIS  01/17/2020   IR PARACENTESIS  01/24/2020   IR PARACENTESIS  01/31/2020   IR PARACENTESIS  02/07/2020   IR PARACENTESIS  02/21/2020   IR PARACENTESIS  05/21/2020  IR PARACENTESIS  10/10/2020   IR PARACENTESIS  10/28/2020   IR PARACENTESIS  11/18/2020   IR PARACENTESIS  12/02/2020   IR PARACENTESIS  12/11/2020   IR PARACENTESIS  12/18/2020   IR PARACENTESIS  12/30/2020   IR PARACENTESIS  01/06/2021   IR PARACENTESIS  01/13/2021   IR PARACENTESIS  01/20/2021   IR PARACENTESIS  01/27/2021   IR PARACENTESIS  02/03/2021   IR PARACENTESIS  02/10/2021   IR PARACENTESIS  02/17/2021   IR PARACENTESIS  02/24/2021   IR PARACENTESIS  03/10/2021   IR RADIOLOGIST EVAL & MGMT  02/14/2018   IR RADIOLOGIST EVAL & MGMT  02/22/2019   LEFT HEART CATH AND CORONARY ANGIOGRAPHY N/A 02/22/2017   Procedure: LEFT HEART CATH AND CORONARY ANGIOGRAPHY;  Surgeon: Nigel Mormon, MD;  Location: [place] [facility];  Service: Cardiovascular;  Laterality: N/A;   LIGATION OF ARTERIOVENOUS  FISTULA Left 07/15/9700   Procedure: Plication of Left Arm Arteriovenous Fistula;  Surgeon: Elam Dutch, MD;  Location: [facility] OR;  Service: Vascular;  Laterality: Left;   POLYPECTOMY     POLYPECTOMY  01/25/2018   Procedure: POLYPECTOMY;  Surgeon: Jerene Bears, MD;  Location: [place];  Service: Gastroenterology;;   REVISON OF ARTERIOVENOUS FISTULA Left 6/37/8588   Procedure: PLICATION OF DISTAL ANEURYSMAL SEGEMENT OF LEFT UPPER ARM ARTERIOVENOUS FISTULA;   Surgeon: Elam Dutch, MD;  Location: [place];  Service: Vascular;  Laterality: Left;   REVISON OF ARTERIOVENOUS FISTULA Left 11/11/7739   Procedure: Plication of Left Upper Arm Fistula ;  Surgeon: Waynetta Sandy, MD;  Location: [place];  Service: Vascular;  Laterality: Left;   SKIN GRAFT SPLIT THICKNESS LEG / FOOT Left    SKIN GRAFT SPLIT THICKNESS LEFT ARM DONOR SITE: LEFT ANTERIOR THIGH   SKIN SPLIT GRAFT Left 07/04/2017   Procedure: SKIN GRAFT SPLIT THICKNESS LEFT ARM DONOR SITE: LEFT ANTERIOR THIGH;  Surgeon: Elam Dutch, MD;  Location: [place];  Service: Vascular;  Laterality: Left;   THROMBECTOMY W/ EMBOLECTOMY Left 06/05/2017   Procedure: EXPLORATION OF LEFT ARM FOR BLEEDING; OVERSEWED PROXIMAL FISTULA;  Surgeon: Angelia Mould, MD;  Location: [facility] OR;  Service: Vascular;  Laterality: Left;   WOUND EXPLORATION Left 06/03/2017   Procedure: WOUND EXPLORATION WITH WOUND VAC APPLICATION TO LEFT ARM;  Surgeon: Angelia Mould, MD;  Location: [facility] OR;  Service: Vascular;  Laterality: Left;    Family History  Problem Relation Age of Onset   Heart disease Mother    Lung cancer Mother    Heart disease Father    Malignant hyperthermia Father    COPD Father    Throat cancer Sister    Esophageal cancer Sister    Hypertension Other    COPD Other    Throat cancer Sister    Colon cancer Neg Hx    Colon polyps Neg Hx    Rectal cancer Neg Hx    Stomach cancer Neg Hx     Social History   Socioeconomic History   Marital status: Single    Spouse name: Not on file   Number of children: 3   Years of education: 10   Highest education level: Not on file  Occupational History   Occupation: Unemployed  Tobacco Use   Smoking status: Every Day    Packs/day: 0.50    Years: 43.00    Pack years: 21.50    Types: Cigarettes    Start date: 08/13/1973   Smokeless tobacco: Never  Vaping Use   Vaping Use: Never used  Substance and Sexual Activity   Alcohol use: Not  Currently    Comment: quit drinking in 2017   Drug use: Yes    Types: Marijuana, Cocaine    Comment:  Quit Cocaine 2020, Last use Marijuana 11/08/20   Sexual activity: Not Currently    Birth control/protection: None  Other Topics Concern   Not on file  Social History Narrative   ** Merged History Encounter **       Lives alone Caffeine use: Coffee-rare Soda- daily  [facility] (03/10/17): Originally [place] [facility]. Previously worked trimming trees. No pets currently. No bird or mold exposure.     Social Determinants of Health   Financial Resource Strain: Not on file  Food Insecurity: Not on file  Transportation Needs: Not on file  Physical Activity: Not on file  Stress: Not on file  Social Connections: Not on file  Intimate Partner Violence: Not on file     Physical Exam   Vitals:   03/15/21 0620 03/15/21 0630  BP: (!) 119/53 (!) 105/59  Pulse: 74 66  Resp: 13 13  Temp:    SpO2: 95% 95%    CONSTITUTIONAL: Chronically ill-appearing, NAD NEURO:  Alert and oriented x 3, no focal deficits EYES:  eyes equal and reactive ENT/NECK:  no LAD, no JVD CARDIO: Regular rate, well-perfused, normal S1 and S2 PULM:  CTAB no wheezing or rhonchi GI/GU:  normal bowel sounds, non-distended, non-tender MSK/SPINE:  No gross deformities, no edema SKIN:  no rash, atraumatic PSYCH:  Appropriate speech and behavior  *Additional and/or pertinent findings included in MDM below  Diagnostic and Interventional Summary    EKG Interpretation  Date/Time:  Sunday March 15 2021 05:17:45 EDT Ventricular Rate:  74 PR Interval:    QRS Duration: 186 QT Interval:  544 QTC Calculation: 604 R Axis:   225 Text Interpretation: Atrial fibrillation Consider left ventricular hypertrophy Abnormal T, consider ischemia, lateral leads Prolonged QT interval Confirmed by Gerlene Fee [phone]) on 03/15/2021 5:49:22 AM       Labs Reviewed  CBC - Abnormal; Notable for the following components:       Result Value   WBC 10.8 (*)    RBC 4.05 (*)    Hemoglobin 11.0 (*)    HCT 32.3 (*)    MCV 79.8 (*)    All other components within normal limits  COMPREHENSIVE METABOLIC PANEL - Abnormal; Notable for the following components:   Sodium 132 (*)    Chloride 90 (*)    Glucose, Bld 109 (*)    BUN 33 (*)    Creatinine, Ser 9.96 (*)    Calcium 8.3 (*)    Total Protein 6.1 (*)    Albumin 2.3 (*)    Alkaline Phosphatase 205 (*)    GFR, Estimated 6 (*)    Anion gap 16 (*)    All other components within normal limits  I-STAT VENOUS BLOOD GAS, ED - Abnormal; Notable for the following components:   pO2, Ven 138.0 (*)    Bicarbonate 29.2 (*)    Acid-Base Excess 4.0 (*)    Sodium 130 (*)    Calcium, Ion 0.92 (*)    HCT 34.0 (*)    Hemoglobin 11.6 (*)    All other components within normal limits  TROPONIN I (HIGH SENSITIVITY) - Abnormal; Notable for the following components:   Troponin I (High Sensitivity) 22 (*)    All other components within normal limits  CK  BRAIN NATRIURETIC PEPTIDE  PROTIME-INR  TROPONIN I (HIGH SENSITIVITY)    DG Chest Port 1 View  Final Result      Medications - No data to display   Procedures  /  Critical Care Procedures  ED Course and Medical Decision Making  I have reviewed the triage vital signs, the nursing notes, and pertinent available records from the EMR.  Listed above are laboratory and imaging tests that I personally ordered, reviewed, and interpreted and then considered in my medical decision making (see below for details).  Suspect fluid overloaded state versus COPD versus less likely ACS, pneumothorax.     Work-up overall reassuring thus far.  Patient is requiring 2 L nasal cannula to maintain saturations in the 90s.  Suspect fluid overloaded state.  Discussed case with Dr. Candiss Norse of nephrology, patient will be evaluated for dialysis here in the hospital.  Plan is for HD and then to be brought back to the emergency department for repeat  evaluation.  Signed out to oncoming provider at shift change.  Barth Kirks. [place]onia Small, [place] [email or web address]  Final Clinical Impressions(s) / ED Diagnoses     ICD-10-CM   1. ESRD (end stage renal disease) ([place])  N18.6     2. Hypervolemia, unspecified hypervolemia type  E87.70       ED Discharge Orders     None        Discharge Instructions Discussed with and Provided to Patient:   Discharge Instructions   None       Maudie Flakes, MD 03/15/21 [phone]
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
## 2021-03-15 NOTE — H&P (Signed)
History and Physical    [name] ZOX:[id number] DOB: Oct 27, 1960 DOA: 03/15/2021  PCP: Charlott Rakes, MD  Patient coming from: Home  I have personally briefly reviewed patient's old medical records in [place]  Chief Complaint: Shortness of breath  HPI: [name] is a 60 y.o. male with medical history significant for ESRD on MWF HD, CAD s/p DES, hepatic cirrhosis secondary to alcohol use with recurrent ascites requiring serial paracentesis, COPD, chronic hepatitis B, and history of substance use disorder (cocaine, alcohol) who presented to the ED morning of 03/15/2021 for evaluation of shortness of breath.  Patient initially came in morning on 03/15/2021 for evaluation of shortness of breath.  O2 saturation was 80% on room air with EMS prior to arrival.  He reportedly missed his last dialysis on 03/13/2021.  Nephrology were consulted and patient was taken upstairs and completed make-up dialysis session.  He was brought back down to the ED, initially seem to have improved but subsequently became hypoxic again and short of breath.  Patient does report continued dyspnea.  He denies any chest pain.  He says he no longer makes urine.  ED Course:  Initial vitals showed BP 124/64, pulse 74, RR 20, temp 98.1 F, SPO2 95% on 2 L supplemental O2 via [place] and required up to 6 L.  Labs-significant for BUN 33, creatinine 9.96, WBC 10.8, hemoglobin 11.0, BNP 668.7.  COVID and influenza negative.  Portable chest x-ray showed cardiomegaly with vascular congestion and small pleural effusions.  Nephrology were consulted and patient was taken for full dialysis session and brought back down to the ED.  On return to the ED patient initially was felt to have improvement and was planned for discharge however subsequently developed recurrent dyspnea with SPO2 down to the 80s on room air.  He was placed on 2 L of home O2 via [place].  Repeat chest x-ray showed improvement in pulmonary  vascular congestion.  Repeat labs showed improvement in BUN and creatinine after dialysis but increased BNP to 1181.6.  Troponin similar to prior at 23.  Patient was ordered to receive albuterol and Atrovent treatments.  The hospitalist service was consulted to admit for further evaluation and management.  Review of Systems: All systems reviewed and are negative except as documented in history of present illness above.   Past Medical History:  Diagnosis Date   Anemia    Anxiety    Arthritis    left shoulder   Arthritis    Atherosclerosis of aorta (HCC)    Cardiomegaly    Chest pain    DATE UNKNOWN, C/O PERIODICALLY   Chest pain    Cocaine abuse (HCC)    COPD (chronic obstructive pulmonary disease) (HCC)    COPD exacerbation ([place]) 08/17/2016   Coronary artery disease    stent 02/22/17   Coronary artery disease    Dysrhythmia    ESRD (end stage renal disease) ([place])    ESRD (end stage renal disease) on dialysis ([place])    "[name]; M-W-F" (07/04/2017)   GERD (gastroesophageal reflux disease)    DATE UNKNOWN   GERD (gastroesophageal reflux disease)    Hemorrhoids    Hepatitis B    Hepatitis B, chronic (HCC)    Hepatitis C    History of kidney stones    Hyperkalemia    Hypertension    Kidney failure    Metabolic bone disease    Patient denies   Mitral stenosis    Myocardial infarction ([place])  Pneumonia    Pulmonary edema    Renal disorder    Solitary rectal ulcer syndrome 07/2017   at flex sig for rectal bleeding   Solitary rectal ulcer syndrome    Tubular adenoma of colon     Past Surgical History:  Procedure Laterality Date   A/V FISTULAGRAM Left 05/26/2017   Procedure: A/V FISTULAGRAM;  Surgeon: Conrad Woodbine, MD;  Location: [facility];  Service: Cardiovascular;  Laterality: Left;   A/V FISTULAGRAM Right 11/18/2017   Procedure: A/V FISTULAGRAM - Right Arm;  Surgeon: Elam Dutch, MD;  Location: [facility];  Service: Cardiovascular;   Laterality: Right;   AMPUTATION Right 09/04/2020   Procedure: RIGHT INDEX AND RIGHT RING FINGER AMPUTATION DIGIT;  Surgeon: Leanora Cover, MD;  Location: [facility];  Service: Orthopedics;  Laterality: Right;   AMPUTATION Right 11/13/2020   Procedure: RIGHT INDEX FINGER AMPUTATION AND RIGHT RING FINGER AMPUTATION;  Surgeon: Leanora Cover, MD;  Location: [facility];  Service: Orthopedics;  Laterality: Right;   APPLICATION OF WOUND VAC Left 06/14/2017   Procedure: APPLICATION OF WOUND VAC;  Surgeon: Katha Cabal, MD;  Location: [facility];  Service: Vascular;  Laterality: Left;   AV FISTULA PLACEMENT  2012   BELIEVED WAS PLACED IN JUNE   AV FISTULA PLACEMENT Right 08/09/2017   Procedure: Creation Right arm ARTERIOVENOUS BRACHIOCEPOHALIC FISTULA;  Surgeon: Elam Dutch, MD;  Location: [facility];  Service: Vascular;  Laterality: Right;   AV FISTULA PLACEMENT Right 11/22/2017   Procedure: INSERTION OF ARTERIOVENOUS (AV) GORE-TEX GRAFT RIGHT UPPER ARM;  Surgeon: Elam Dutch, MD;  Location: [facility];  Service: Vascular;  Laterality: Right;   BIOPSY  01/25/2018   Procedure: BIOPSY;  Surgeon: Jerene Bears, MD;  Location: [facility];  Service: Gastroenterology;;   BIOPSY  04/10/2019   Procedure: BIOPSY;  Surgeon: Jerene Bears, MD;  Location: [facility];  Service: Gastroenterology;;   COLONOSCOPY     COLONOSCOPY WITH PROPOFOL N/A 01/25/2018   Procedure: COLONOSCOPY WITH PROPOFOL;  Surgeon: Jerene Bears, MD;  Location: [place];  Service: Gastroenterology;  Laterality: N/A;   CORONARY STENT INTERVENTION N/A 02/22/2017   Procedure: CORONARY STENT INTERVENTION;  Surgeon: Nigel Mormon, MD;  Location: [facility];  Service: Cardiovascular;  Laterality: N/A;   ESOPHAGOGASTRODUODENOSCOPY (EGD) WITH PROPOFOL N/A 01/25/2018   Procedure: ESOPHAGOGASTRODUODENOSCOPY (EGD) WITH PROPOFOL;  Surgeon: Jerene Bears, MD;  Location: [facility];  Service: Gastroenterology;  Laterality: N/A;    ESOPHAGOGASTRODUODENOSCOPY (EGD) WITH PROPOFOL N/A 04/10/2019   Procedure: ESOPHAGOGASTRODUODENOSCOPY (EGD) WITH PROPOFOL;  Surgeon: Jerene Bears, MD;  [facility]: [facility];  Service: Gastroenterology;  Laterality: N/A;   FLEXIBLE SIGMOIDOSCOPY N/A 07/15/2017   Procedure: FLEXIBLE SIGMOIDOSCOPY;  Surgeon: Carol Ada, MD;  Location: [facility];  Service: Endoscopy;  Laterality: N/A;   HEMORRHOID BANDING     I & D EXTREMITY Left 06/01/2017   Procedure: IRRIGATION AND DEBRIDEMENT LEFT ARM HEMATOMA WITH LIGATION OF LEFT ARM AV FISTULA;  Surgeon: Elam Dutch, MD;  Location: [facility];  Service: Vascular;  Laterality: Left;   I & D EXTREMITY Left 06/14/2017   Procedure: IRRIGATION AND DEBRIDEMENT EXTREMITY;  Surgeon: Katha Cabal, MD;  Location: [facility];  Service: Vascular;  Laterality: Left;   INSERTION OF DIALYSIS CATHETER  05/30/2017   INSERTION OF DIALYSIS CATHETER N/A 05/30/2017   Procedure: INSERTION OF DIALYSIS CATHETER;  Surgeon: Elam Dutch, MD;  Location: [facility];  Service: Vascular;  Laterality: N/A;   IR PARACENTESIS  08/30/2017   IR PARACENTESIS  09/29/2017   IR PARACENTESIS  10/28/2017   IR PARACENTESIS  11/09/2017   IR PARACENTESIS  11/16/2017   IR PARACENTESIS  11/28/2017   IR PARACENTESIS  12/01/2017   IR PARACENTESIS  12/06/2017   IR PARACENTESIS  01/03/2018   IR PARACENTESIS  01/23/2018   IR PARACENTESIS  02/07/2018   IR PARACENTESIS  02/21/2018   IR PARACENTESIS  03/06/2018   IR PARACENTESIS  03/17/2018   IR PARACENTESIS  04/04/2018   IR PARACENTESIS  12/28/2018   IR PARACENTESIS  01/08/2019   IR PARACENTESIS  01/23/2019   IR PARACENTESIS  02/01/2019   IR PARACENTESIS  02/19/2019   IR PARACENTESIS  03/01/2019   IR PARACENTESIS  03/15/2019   IR PARACENTESIS  04/03/2019   IR PARACENTESIS  04/12/2019   IR PARACENTESIS  05/01/2019   IR PARACENTESIS  05/08/2019   IR PARACENTESIS  05/24/2019   IR PARACENTESIS  06/12/2019   IR PARACENTESIS  07/09/2019   IR PARACENTESIS  07/27/2019    IR PARACENTESIS  08/09/2019   IR PARACENTESIS  08/21/2019   IR PARACENTESIS  09/17/2019   IR PARACENTESIS  10/05/2019   IR PARACENTESIS  10/29/2019   IR PARACENTESIS  11/08/2019   IR PARACENTESIS  12/12/2019   IR PARACENTESIS  01/03/2020   IR PARACENTESIS  01/10/2020   IR PARACENTESIS  01/17/2020   IR PARACENTESIS  01/24/2020   IR PARACENTESIS  01/31/2020   IR PARACENTESIS  02/07/2020   IR PARACENTESIS  02/21/2020   IR PARACENTESIS  05/21/2020   IR PARACENTESIS  10/10/2020   IR PARACENTESIS  10/28/2020   IR PARACENTESIS  11/18/2020   IR PARACENTESIS  12/02/2020   IR PARACENTESIS  12/11/2020   IR PARACENTESIS  12/18/2020   IR PARACENTESIS  12/30/2020   IR PARACENTESIS  01/06/2021   IR PARACENTESIS  01/13/2021   IR PARACENTESIS  01/20/2021   IR PARACENTESIS  01/27/2021   IR PARACENTESIS  02/03/2021   IR PARACENTESIS  02/10/2021   IR PARACENTESIS  02/17/2021   IR PARACENTESIS  02/24/2021   IR PARACENTESIS  03/10/2021   IR RADIOLOGIST EVAL & MGMT  02/14/2018   IR RADIOLOGIST EVAL & MGMT  02/22/2019   LEFT HEART CATH AND CORONARY ANGIOGRAPHY N/A 02/22/2017   Procedure: LEFT HEART CATH AND CORONARY ANGIOGRAPHY;  Surgeon: Nigel Mormon, MD;  Location: [facility];  Service: Cardiovascular;  Laterality: N/A;   LIGATION OF ARTERIOVENOUS  FISTULA Left 07/18/735   Procedure: Plication of Left Arm Arteriovenous Fistula;  Surgeon: Elam Dutch, MD;  Location: [facility];  Service: Vascular;  Laterality: Left;   POLYPECTOMY     POLYPECTOMY  01/25/2018   Procedure: POLYPECTOMY;  Surgeon: Jerene Bears, MD;  Location: [facility];  Service: Gastroenterology;;   REVISON OF ARTERIOVENOUS FISTULA Left 07/17/2692   Procedure: PLICATION OF DISTAL ANEURYSMAL SEGEMENT OF LEFT UPPER ARM ARTERIOVENOUS FISTULA;  Surgeon: Elam Dutch, MD;  Location: [facility];  Service: Vascular;  Laterality: Left;   REVISON OF ARTERIOVENOUS FISTULA Left 8/54/6270   Procedure: Plication of Left Upper Arm Fistula ;  Surgeon: Waynetta Sandy, MD;  Location: [facility];  Service: Vascular;  Laterality: Left;   SKIN GRAFT SPLIT THICKNESS LEG / FOOT Left    SKIN GRAFT SPLIT THICKNESS LEFT ARM DONOR SITE: LEFT ANTERIOR THIGH   SKIN SPLIT GRAFT Left 07/04/2017   Procedure: SKIN GRAFT SPLIT THICKNESS LEFT ARM DONOR SITE:  LEFT ANTERIOR THIGH;  Surgeon: Elam Dutch, MD;  Location: [facility];  Service: Vascular;  Laterality: Left;   THROMBECTOMY W/ EMBOLECTOMY Left 06/05/2017   Procedure: EXPLORATION OF LEFT ARM FOR BLEEDING; OVERSEWED PROXIMAL FISTULA;  Surgeon: Angelia Mould, MD;  Location: [place];  Service: Vascular;  Laterality: Left;   WOUND EXPLORATION Left 06/03/2017   Procedure: WOUND EXPLORATION WITH WOUND VAC APPLICATION TO LEFT ARM;  Surgeon: Angelia Mould, MD;  Location: [facility];  Service: Vascular;  Laterality: Left;    Social History:  reports that he has been smoking cigarettes. He started smoking about 47 years ago. He has a 21.50 pack-year smoking history. He has never used smokeless tobacco. He reports that he does not currently use alcohol. He reports current drug use. Drugs: Marijuana and Cocaine.  Allergies  Allergen Reactions   Tramadol Itching and Other (See Comments)    Other reaction(s): Unknown   Grass Extracts [Gramineae Pollens]     Other reaction(s): Sneezing   Morphine And Related Other (See Comments)    Stomach pain   Pollen Extract Other (See Comments)    Other reaction(s): Sneezing (finding)   Acetaminophen Nausea Only    Stomach ache    Aspirin Itching and Other (See Comments)    STOMACH PAIN  Other reaction(s): Unknown   Clonidine Derivatives Itching    Family History  Problem Relation Age of Onset   Heart disease Mother    Lung cancer Mother    Heart disease Father    Malignant hyperthermia Father    COPD Father    Throat cancer Sister    Esophageal cancer Sister    Hypertension Other    COPD Other    Throat cancer Sister    Colon cancer Neg Hx    Colon polyps Neg  Hx    Rectal cancer Neg Hx    Stomach cancer Neg Hx      Prior to Admission medications   Medication Sig Start Date End Date Taking? Authorizing Provider  acetaminophen (TYLENOL) 500 MG tablet Take 500-1,000 mg by mouth every 6 (six) hours as needed (pain.).    [provider]  albuterol (VENTOLIN HFA) 108 (90 Base) MCG/ACT inhaler INHALE 2 PUFFS BY MOUTH EVERY 4 HOURS AS NEEDED FOR WHEEZE OR FOR SHORTNESS OF BREATH Patient taking differently: Inhale 2 puffs into the lungs every 4 (four) hours as needed for wheezing or shortness of breath. 02/06/21   Cantwell, Celeste C, PA-C  amiodarone (PACERONE) 200 MG tablet TAKE 1 TABLET BY MOUTH EVERY DAY Patient taking differently: Take 200 mg by mouth in the morning. 09/29/20   Cantwell, Celeste C, PA-C  cetirizine (ZYRTEC) 10 MG tablet Take 10 mg by mouth daily. 06/23/20   [provider]  cinacalcet (SENSIPAR) 30 MG tablet TAKE 2 TABLETS (60 MG TOTAL) BY MOUTH EVERY MONDAY, WEDNESDAY, AND FRIDAY WITH HEMODIALYSIS. Patient taking differently: Take 60 mg by mouth 2 (two) times daily with a meal. Takes when he eats 09/12/20 09/12/21  Florencia Reasons, MD  diltiazem (CARDIZEM CD) 120 MG 24 hr capsule TAKE 1 CAPSULE BY MOUTH EVERY DAY Patient taking differently: Take 120 mg by mouth daily. 12/15/20   Adrian Prows, MD  gabapentin (NEURONTIN) 300 MG capsule TAKE 1 CAPSULE BY MOUTH IN THE MORNING AND AT BEDTIME Patient taking differently: Take 300 mg by mouth 2 (two) times daily. 11/14/20   Charlott Rakes, MD  lactulose (CHRONULAC) 10 GM/15ML solution Take 30 mLs (20 g total) by mouth  daily. Patient taking differently: Take 20 g by mouth daily as needed (constipation). 09/12/20   Florencia Reasons, MD  lidocaine-prilocaine (EMLA) cream Apply 1 application topically every Monday, Wednesday, and Friday with hemodialysis. Applied sparingly for port access 11/01/20   [provider]  metoprolol succinate (TOPROL-XL) 25 MG 24 hr tablet Take 0.5 tablets (12.5 mg  total) by mouth daily. Patient taking differently: Take 25 mg by mouth daily. 01/29/21   Cantwell, Celeste C, PA-C  ondansetron (ZOFRAN) 4 MG tablet Take 1 tablet (4 mg total) by mouth every 8 (eight) hours as needed for up to 7 days for vomiting or nausea. 03/11/21 03/18/21  Fenton Foy, NP  ondansetron (ZOFRAN-ODT) 4 MG disintegrating tablet Take 4 mg by mouth at bedtime as needed for nausea. 02/07/21   [provider]  oxyCODONE (ROXICODONE) 5 MG immediate release tablet 1 tab PO q6 hours prn pain Patient not taking: No sig reported 11/13/20   Leanora Cover, MD  pantoprazole (PROTONIX) 40 MG tablet Take 40 mg by mouth daily.    [provider]  polyethylene glycol (MIRALAX / GLYCOLAX) 17 g packet Take 17 g by mouth daily as needed (constipation).    [provider]  sevelamer carbonate (RENVELA) 800 MG tablet Take 1,600-3,200 mg by mouth See admin instructions. Taking 4 tablets (3200 mg) three times daily with meals and 2 tabs (1600 mg) with snacks.    [provider]    Physical Exam: Vitals:   03/15/21 1756 03/15/21 1810 03/15/21 2110 03/15/21 2113  BP: 120/76   127/82  Pulse: 61   72  Resp: 16   20  Temp: 97.9 F (36.6 C)     TempSrc: Oral     SpO2: 99% 98% (!) 86% 93%  Weight:      Height:       Constitutional: Resting supine in bed, appears fatigued but in NAD, calm, comfortable Eyes: PERRL, lids and conjunctivae normal ENMT: Mucous membranes are moist. Posterior pharynx clear of any exudate or lesions.Normal dentition.  Neck: normal, supple, no masses. Respiratory: Coarse wheezing throughout.  Slight increased respiratory effort. No accessory muscle use.  Cardiovascular: Irregularly irregular, no murmurs / rubs / gallops. No extremity edema. 2+ pedal pulses. Abdomen: Slightly distended with mild generalized tenderness, no masses palpated. No hepatosplenomegaly. Bowel sounds positive.  Musculoskeletal: no clubbing / cyanosis. No joint deformity  upper and lower extremities. Good ROM, no contractures. Normal muscle tone.  Skin: no rashes, lesions, ulcers. No induration Neurologic: CN 2-12 grossly intact. Sensation intact. Strength 5/5 in all 4.  Psychiatric: Normal judgment and insight. Alert and oriented x 3. Normal mood.    Labs on Admission: I have personally reviewed following labs and imaging studies  CBC: Recent Labs  Lab 03/13/21 1245 03/13/21 1307 03/15/21 0500 03/15/21 0646 03/15/21 2126  WBC 8.7  --  10.8*  --  10.4  NEUTROABS  --   --   --   --  8.4*  HGB 11.1* 11.2* 11.0* 11.6* 10.5*  HCT 32.6* 33.0* 32.3* 34.0* 30.5*  MCV 80.3  --  79.8*  --  78.4*  PLT 217  --  231  --  326   Basic Metabolic Panel: Recent Labs  Lab 03/13/21 1245 03/13/21 1307 03/15/21 0500 03/15/21 0646 03/15/21 2126  NA 132* 132* 132* 130* 135  K 4.3 3.7 4.2 4.3 4.3  CL 93* 93* 90*  --  95*  CO2 25  --  26  --  29  GLUCOSE 83 86 109*  --  91  BUN 25* 27* 33*  --  20  CREATININE 8.01* 7.80* 9.96*  --  6.68*  CALCIUM 7.3*  --  8.3*  --  8.2*   GFR: Estimated Creatinine Clearance: 11.1 mL/min (A) (by C-G formula based on SCr of 6.68 mg/dL (H)). Liver Function Tests: Recent Labs  Lab 03/13/21 1245 03/15/21 0500  AST 29 25  ALT 14 15  ALKPHOS 187* 205*  BILITOT 0.8 0.8  PROT 5.6* 6.1*  ALBUMIN 2.1* 2.3*   No results for input(s): LIPASE, AMYLASE in the last 168 hours. No results for input(s): AMMONIA in the last 168 hours. Coagulation Profile: No results for input(s): INR, PROTIME in the last 168 hours. Cardiac Enzymes: Recent Labs  Lab 03/15/21 0511  CKTOTAL 194   BNP (last 3 results) No results for input(s): PROBNP in the last 8760 hours. HbA1C: No results for input(s): HGBA1C in the last 72 hours. CBG: No results for input(s): GLUCAP in the last 168 hours. Lipid Profile: No results for input(s): CHOL, HDL, LDLCALC, TRIG, CHOLHDL, LDLDIRECT in the last 72 hours. Thyroid Function Tests: No results for  input(s): TSH, T4TOTAL, FREET4, T3FREE, THYROIDAB in the last 72 hours. Anemia Panel: No results for input(s): VITAMINB12, FOLATE, FERRITIN, TIBC, IRON, RETICCTPCT in the last 72 hours. Urine analysis:    Component Value Date/Time   COLORURINE YELLOW 07/16/2011 1619   APPEARANCEUR CLEAR 07/16/2011 1619   LABSPEC 1.018 07/16/2011 1619   PHURINE 7.5 07/16/2011 1619   GLUCOSEU 250 (A) 07/16/2011 1619   HGBUR MODERATE (A) 07/16/2011 1619   BILIRUBINUR SMALL (A) 07/16/2011 1619   KETONESUR NEGATIVE 07/16/2011 1619   PROTEINUR >300 (A) 07/16/2011 1619   UROBILINOGEN 1.0 07/16/2011 1619   NITRITE NEGATIVE 07/16/2011 1619   LEUKOCYTESUR SMALL (A) 07/16/2011 1619    Radiological Exams on Admission: DG Chest 1 View  Result Date: 03/15/2021 CLINICAL DATA:  Dyspnea EXAM: CHEST  1 VIEW COMPARISON:  5:11 a.m. FINDINGS: Lung volumes are small and pulmonary insufflation has slightly diminished since prior examination. Mild left basilar atelectasis. No pneumothorax or pleural effusion. Cardiomegaly is stable when accounting for poor pulmonary insufflation. Central pulmonary vascular congestion has improved slightly in the interval. No overt pulmonary edema. Advanced vascular calcifications are seen within the aortic arch and arch vasculature. IMPRESSION: Pulmonary hypoinflation. Stable cardiomegaly. Improved central pulmonary vascular congestion. Peripheral vascular disease. Electronically Signed   By: Fidela Salisbury M.D.   On: 03/15/2021 22:33   DG Chest Port 1 View  Result Date: 03/15/2021 CLINICAL DATA:  Shortness of breath EXAM: PORTABLE CHEST 1 VIEW COMPARISON:  03/13/2021 radiograph and CT FINDINGS: Cardiomegaly. Extensive atheromatous calcified plaque. Low volume chest with streaky density asymmetric to the left base. Vascular congestion. Small pleural effusions. IMPRESSION: 1. Cardiomegaly and vascular congestion with small pleural effusions. 2. Atelectasis.  No significant change compared to 2 days  ago. Electronically Signed   By: Monte Fantasia M.D.   On: 03/15/2021 05:36    EKG: Personally reviewed. Atrial fibrillation, rate 74, QTc 604.  Similar to prior.  Assessment/Plan Principal Problem:   Acute respiratory failure with hypoxia (HCC) Active Problems:   COPD (chronic obstructive pulmonary disease) (HCC)   ESRD on dialysis ([place])   Cirrhosis of liver with ascites (HCC)   Chronic atrial fibrillation (HCC)   [name] is a 60 y.o. male with medical history significant for ESRD on MWF HD, CAD s/p DES, hepatic cirrhosis secondary to alcohol use with recurrent  ascites requiring serial paracentesis, COPD, chronic hepatitis B, and history of substance use disorder (cocaine, alcohol) who is admitted with acute respiratory failure with hypoxia.  Acute respiratory failure with hypoxia due to COPD exacerbation: Coarse wheezing throughout after receiving dialysis earlier today.  SPO2 dropped to 80s while on room air, currently requiring 2 L O2 via [place]. -Start IV Solu-Medrol 40 mg twice daily -Start scheduled DuoNebs with as needed albuterol -Continue supplemental oxygen as needed  ESRD on MWF HD: Underwent off schedule HD earlier today (9/4) with nephrology.  Will need continued nephrology involvement for further dialysis on Monday.  Hepatic cirrhosis with recurrent ascites: Has been undergoing serial paracentesis, last on 8/30 with 4 L removed.  May need repeat paracentesis while here.  Continue lactulose as needed.  CAD s/p DES: Chronic and appears stable.  Minimal troponin elevation while in the ED.  Chronic atrial fibrillation: Remains in atrial fibrillation with controlled rate.  Continue amiodarone, diltiazem, Toprol-XL.  History of substance use disorder: Previous history of cocaine and alcohol use.   DVT prophylaxis: Subcutaneous heparin Code Status: DNR, confirmed with patient on admission Family Communication: Discussed with patient, he has discussed with  family Disposition Plan: From home, dispo pending clinical progress Consults called: Nephrology Level of care: Med-Surg Admission status:  Status is: Observation  The patient remains OBS appropriate and will d/c before 2 midnights.  Dispo: The patient is from: Home              Anticipated d/c is to: Home              Patient currently is not medically stable to d/c.   Zada Finders MD [facility]  If 7PM-7AM, please contact night-coverage [email or web address]  03/16/2021, 12:02 AM

## 2021-03-15 NOTE — Discharge Instructions (Addendum)
Follow-up with your primary care doctor.  Continue your regular dialysis schedule.  Come back if you develop difficulty breathing or other new concerning symptom

## 2021-03-15 NOTE — ED Provider Notes (Addendum)
Update note  60 year old male was seen in the ER due to concern for shortness of breath, had missed dialysis.  Work-up was overall reassuring but patient still requiring small amount of nasal cannula oxygen, concern for fluid overload state in the setting of missed dialysis.  Nephrology consulted.  Patient received dialysis today.  I evaluated patient after he returned to the emergency room from dialysis.  Appears well in no distress.  Taken off nasal cannula and did not have any recurrent hypoxia.  Patient states that he feels better and denies shortness of breath.  Remainder of vitals are also stable.  Given resolution of symptoms and normalization of vital signs on room air, believe he can be discharged and managed in the outpatient setting. Plan to discharge home.      ADDENDUM 10:20 PM patient had recurrence of difficulty breathing and was noted to be hypoxic into the mid 80s on room air.  He was subsequently placed on 2 L nasal cannula.  Repeat lung exam, patient noted to have bilateral wheezing.  Will repeat blood work, chest x-ray, give trial of albuterol and reassess.  Likely will need admission.  11:13 PM repeat labs are stable, chest x-ray is actually improved.  Patient still appears somewhat tachypneic and requiring O2.  Will consult hospitalist for admission.   Lucrezia Starch, MD 03/15/21 2221    Lucrezia Starch, MD 03/15/21 [phone]
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
## 2021-03-16 ENCOUNTER — Encounter (HOSPITAL_COMMUNITY): Payer: Self-pay | Admitting: Internal Medicine

## 2021-03-16 ENCOUNTER — Other Ambulatory Visit: Payer: Self-pay

## 2021-03-16 DIAGNOSIS — F10129 Alcohol abuse with intoxication, unspecified: Secondary | ICD-10-CM | POA: Diagnosis present

## 2021-03-16 DIAGNOSIS — I482 Chronic atrial fibrillation, unspecified: Secondary | ICD-10-CM | POA: Diagnosis present

## 2021-03-16 DIAGNOSIS — J90 Pleural effusion, not elsewhere classified: Secondary | ICD-10-CM | POA: Diagnosis present

## 2021-03-16 DIAGNOSIS — Z992 Dependence on renal dialysis: Secondary | ICD-10-CM | POA: Diagnosis not present

## 2021-03-16 DIAGNOSIS — J441 Chronic obstructive pulmonary disease with (acute) exacerbation: Secondary | ICD-10-CM | POA: Diagnosis present

## 2021-03-16 DIAGNOSIS — I2510 Atherosclerotic heart disease of native coronary artery without angina pectoris: Secondary | ICD-10-CM | POA: Diagnosis present

## 2021-03-16 DIAGNOSIS — Z9119 Patient's noncompliance with other medical treatment and regimen: Secondary | ICD-10-CM | POA: Diagnosis not present

## 2021-03-16 DIAGNOSIS — Z66 Do not resuscitate: Secondary | ICD-10-CM | POA: Diagnosis present

## 2021-03-16 DIAGNOSIS — K7031 Alcoholic cirrhosis of liver with ascites: Secondary | ICD-10-CM | POA: Diagnosis present

## 2021-03-16 DIAGNOSIS — D631 Anemia in chronic kidney disease: Secondary | ICD-10-CM | POA: Diagnosis present

## 2021-03-16 DIAGNOSIS — Z955 Presence of coronary angioplasty implant and graft: Secondary | ICD-10-CM | POA: Diagnosis not present

## 2021-03-16 DIAGNOSIS — I9589 Other hypotension: Secondary | ICD-10-CM | POA: Diagnosis present

## 2021-03-16 DIAGNOSIS — I120 Hypertensive chronic kidney disease with stage 5 chronic kidney disease or end stage renal disease: Secondary | ICD-10-CM | POA: Diagnosis present

## 2021-03-16 DIAGNOSIS — N2581 Secondary hyperparathyroidism of renal origin: Secondary | ICD-10-CM | POA: Diagnosis present

## 2021-03-16 DIAGNOSIS — R0600 Dyspnea, unspecified: Secondary | ICD-10-CM | POA: Diagnosis present

## 2021-03-16 DIAGNOSIS — I700 Atherosclerosis of aorta: Secondary | ICD-10-CM | POA: Diagnosis present

## 2021-03-16 DIAGNOSIS — E8770 Fluid overload, unspecified: Secondary | ICD-10-CM | POA: Diagnosis present

## 2021-03-16 DIAGNOSIS — Y92410 Unspecified street and highway as the place of occurrence of the external cause: Secondary | ICD-10-CM | POA: Diagnosis not present

## 2021-03-16 DIAGNOSIS — Z20822 Contact with and (suspected) exposure to covid-19: Secondary | ICD-10-CM | POA: Diagnosis present

## 2021-03-16 DIAGNOSIS — B181 Chronic viral hepatitis B without delta-agent: Secondary | ICD-10-CM | POA: Diagnosis present

## 2021-03-16 DIAGNOSIS — Z5329 Procedure and treatment not carried out because of patient's decision for other reasons: Secondary | ICD-10-CM | POA: Diagnosis present

## 2021-03-16 DIAGNOSIS — K219 Gastro-esophageal reflux disease without esophagitis: Secondary | ICD-10-CM | POA: Diagnosis present

## 2021-03-16 DIAGNOSIS — N186 End stage renal disease: Secondary | ICD-10-CM | POA: Diagnosis present

## 2021-03-16 DIAGNOSIS — J9601 Acute respiratory failure with hypoxia: Secondary | ICD-10-CM | POA: Diagnosis present

## 2021-03-16 DIAGNOSIS — G9341 Metabolic encephalopathy: Secondary | ICD-10-CM | POA: Diagnosis present

## 2021-03-16 DIAGNOSIS — Z9115 Patient's noncompliance with renal dialysis: Secondary | ICD-10-CM | POA: Diagnosis not present

## 2021-03-16 LAB — I-STAT ARTERIAL BLOOD GAS, ED
Acid-Base Excess: 5 mmol/L — ABNORMAL HIGH (ref 0.0–2.0)
Bicarbonate: 30.4 mmol/L — ABNORMAL HIGH (ref 20.0–28.0)
Calcium, Ion: 1.15 mmol/L (ref 1.15–1.40)
HCT: 32 % — ABNORMAL LOW (ref 39.0–52.0)
Hemoglobin: 10.9 g/dL — ABNORMAL LOW (ref 13.0–17.0)
O2 Saturation: 96 %
Patient temperature: 98
Potassium: 4.7 mmol/L (ref 3.5–5.1)
Sodium: 133 mmol/L — ABNORMAL LOW (ref 135–145)
TCO2: 32 mmol/L (ref 22–32)
pCO2 arterial: 45.2 mmHg (ref 32.0–48.0)
pH, Arterial: 7.434 (ref 7.350–7.450)
pO2, Arterial: 79 mmHg — ABNORMAL LOW (ref 83.0–108.0)

## 2021-03-16 LAB — BLOOD GAS, VENOUS
Acid-Base Excess: 4.1 mmol/L — ABNORMAL HIGH (ref 0.0–2.0)
Bicarbonate: 28 mmol/L (ref 20.0–28.0)
Drawn by: 41675
FIO2: 32
O2 Saturation: 85.2 %
Patient temperature: 37
pCO2, Ven: 41.7 mmHg — ABNORMAL LOW (ref 44.0–60.0)
pH, Ven: 7.443 — ABNORMAL HIGH (ref 7.250–7.430)
pO2, Ven: 51.6 mmHg — ABNORMAL HIGH (ref 32.0–45.0)

## 2021-03-16 LAB — BASIC METABOLIC PANEL
Anion gap: 15 (ref 5–15)
BUN: 22 mg/dL — ABNORMAL HIGH (ref 6–20)
CO2: 26 mmol/L (ref 22–32)
Calcium: 8.6 mg/dL — ABNORMAL LOW (ref 8.9–10.3)
Chloride: 93 mmol/L — ABNORMAL LOW (ref 98–111)
Creatinine, Ser: 7.18 mg/dL — ABNORMAL HIGH (ref 0.61–1.24)
GFR, Estimated: 8 mL/min — ABNORMAL LOW (ref >60)
Glucose, Bld: 104 mg/dL — ABNORMAL HIGH (ref 70–99)
Potassium: 4.7 mmol/L (ref 3.5–5.1)
Sodium: 134 mmol/L — ABNORMAL LOW (ref 135–145)

## 2021-03-16 LAB — AMMONIA: Ammonia: 16 umol/L (ref 9–35)

## 2021-03-16 MED ORDER — METOPROLOL SUCCINATE ER 25 MG PO TB24
12.5000 mg | ORAL_TABLET | Freq: Every day | ORAL | Status: DC
  Administered 2021-03-16 – 2021-03-17 (×2): 12.5 mg via ORAL
  Filled 2021-03-16 (×2): qty 1

## 2021-03-16 MED ORDER — IPRATROPIUM-ALBUTEROL 0.5-2.5 (3) MG/3ML IN SOLN
3.0000 mL | Freq: Three times a day (TID) | RESPIRATORY_TRACT | Status: DC
  Administered 2021-03-16 (×2): 3 mL via RESPIRATORY_TRACT
  Filled 2021-03-16 (×2): qty 3

## 2021-03-16 MED ORDER — ALTEPLASE 2 MG IJ SOLR
2.0000 mg | Freq: Once | INTRAMUSCULAR | Status: DC | PRN
  Filled 2021-03-16: qty 2

## 2021-03-16 MED ORDER — SODIUM CHLORIDE 0.9 % IV SOLN: 100.0000 mL | INTRAVENOUS | Status: DC | PRN

## 2021-03-16 MED ORDER — HEPARIN SODIUM (PORCINE) 5000 UNIT/ML IJ SOLN
5000.0000 [IU] | Freq: Three times a day (TID) | INTRAMUSCULAR | Status: DC
  Administered 2021-03-16 – 2021-03-17 (×2): 5000 [IU] via SUBCUTANEOUS
  Filled 2021-03-16 (×3): qty 1

## 2021-03-16 MED ORDER — SEVELAMER CARBONATE 800 MG PO TABS
3200.0000 mg | ORAL_TABLET | Freq: Three times a day (TID) | ORAL | Status: DC
  Administered 2021-03-17: 3200 mg via ORAL
  Filled 2021-03-16 (×2): qty 4

## 2021-03-16 MED ORDER — METHYLPREDNISOLONE SODIUM SUCC 40 MG IJ SOLR
40.0000 mg | Freq: Two times a day (BID) | INTRAMUSCULAR | Status: DC
  Administered 2021-03-16 – 2021-03-17 (×4): 40 mg via INTRAVENOUS
  Filled 2021-03-16 (×4): qty 1

## 2021-03-16 MED ORDER — DILTIAZEM HCL ER COATED BEADS 120 MG PO CP24
120.0000 mg | ORAL_CAPSULE | Freq: Every day | ORAL | Status: DC
  Administered 2021-03-16 – 2021-03-17 (×2): 120 mg via ORAL
  Filled 2021-03-16 (×2): qty 1

## 2021-03-16 MED ORDER — LACTULOSE 10 GM/15ML PO SOLN: 20.0000 g | Freq: Every day | ORAL | Status: DC | PRN

## 2021-03-16 MED ORDER — ALBUMIN HUMAN 25 % IV SOLN: 12.5000 g | Freq: Once | INTRAVENOUS | Status: DC

## 2021-03-16 MED ORDER — CINACALCET HCL 30 MG PO TABS
60.0000 mg | ORAL_TABLET | Freq: Two times a day (BID) | ORAL | Status: DC
  Administered 2021-03-16 – 2021-03-17 (×2): 60 mg via ORAL
  Filled 2021-03-16 (×2): qty 2

## 2021-03-16 MED ORDER — SODIUM CHLORIDE 0.9% FLUSH
3.0000 mL | Freq: Two times a day (BID) | INTRAVENOUS | Status: DC
  Administered 2021-03-16 – 2021-03-17 (×3): 3 mL via INTRAVENOUS

## 2021-03-16 MED ORDER — ALBUTEROL SULFATE (2.5 MG/3ML) 0.083% IN NEBU: 2.5000 mg | INHALATION_SOLUTION | RESPIRATORY_TRACT | Status: DC | PRN

## 2021-03-16 MED ORDER — SEVELAMER CARBONATE 800 MG PO TABS: 1600.0000 mg | ORAL_TABLET | ORAL | Status: DC | PRN

## 2021-03-16 MED ORDER — HEPARIN SODIUM (PORCINE) 1000 UNIT/ML DIALYSIS
1000.0000 [IU] | INTRAMUSCULAR | Status: DC | PRN
  Filled 2021-03-16: qty 1

## 2021-03-16 MED ORDER — CALCITRIOL 0.25 MCG PO CAPS
1.5000 ug | ORAL_CAPSULE | ORAL | Status: DC
  Administered 2021-03-17: 1.5 ug via ORAL
  Filled 2021-03-16: qty 6

## 2021-03-16 MED ORDER — ALBUMIN HUMAN 25 % IV SOLN
25.0000 g | Freq: Once | INTRAVENOUS | Status: AC
  Administered 2021-03-16: 25 g via INTRAVENOUS
  Filled 2021-03-16: qty 100

## 2021-03-16 MED ORDER — AMIODARONE HCL 200 MG PO TABS
200.0000 mg | ORAL_TABLET | Freq: Every day | ORAL | Status: DC
  Administered 2021-03-16 – 2021-03-17 (×2): 200 mg via ORAL
  Filled 2021-03-16 (×2): qty 1

## 2021-03-16 MED ORDER — IPRATROPIUM-ALBUTEROL 0.5-2.5 (3) MG/3ML IN SOLN
3.0000 mL | Freq: Two times a day (BID) | RESPIRATORY_TRACT | Status: DC
  Administered 2021-03-16 – 2021-03-17 (×2): 3 mL via RESPIRATORY_TRACT
  Filled 2021-03-16 (×2): qty 3
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
## 2021-03-16 NOTE — ED Notes (Signed)
Admitting provider bedside 

## 2021-03-16 NOTE — ED Notes (Signed)
Breakfast orders placed 

## 2021-03-16 NOTE — Progress Notes (Signed)
PROGRESS NOTE    Cort Dragoo  MBT:[id number] DOB: 11-03-1960 DOA: 03/15/2021 PCP: Charlott Rakes, MD  Brief Narrative: 60 year old male with history of ESRD on HD MWF, last history of CAD with PCI and stents, history of hepatic cirrhosis, alcohol abuse, recurrent ascites requiring paracentesis, COPD, chronic hepatitis B,, polysubstance abuse (alcohol and cocaine) presented to the ED for evaluation of shortness of breath. -Upon arrival to the ED 9/4 he was noted to be hypoxic with sats of 80% on room air, he reportedly missed dialysis on 9/2, initial x-ray showed cardiomegaly, small pleural effusion and pulmonary vascular congestion seen by nephrology underwent hemodialysis, subsequently still remained dyspneic and somewhat lethargic, repeat x-ray showed improvement in pulmonary vascular congestion, admitted by [facility]   Assessment & Plan:   Acute respiratory failure with hypoxia -Multifactorial, component of volume overload, COPD exacerbation, recurrent ascites likely also contributing -Underwent HD yesterday for missed hemodialysis on 9/2 -He was noted to be wheezing yesterday and started on IV Solu-Medrol, duo nebs supplemental O2 etc. -Monitor response to therapy, HD today  -may need repeat paracentesis  Encephalopathy -Check ABG and ammonia level -Keep n.p.o. until mental status improves  ESRD on MWF HD: Underwent off schedule HD yesterday (9/4) with nephrology.   -Nephrology consulting, due for HD today   Hepatic cirrhosis with recurrent ascites: -Has been undergoing serial paracentesis, last on 8/30 with 4 L removed. -May need repeat paracentesis while here.   -Add daily lactulose   CAD s/p DES: -appears stable.     Chronic atrial fibrillation: -Continue amiodarone, diltiazem, Toprol-XL. -Per cardiology notes not on anticoagulation due to liver cirrhosis, compliance issues and alcoholism   History of substance use disorder: -cocaine and alcohol use.     DVT  prophylaxis: Subcutaneous heparin Code Status: DNR, confirmed with patient on admission   DVT prophylaxis:Hep SQ Code Status: DNR Family Communication: No family at bedside Disposition Plan:  Status is: Observation  The patient will require care spanning > 2 midnights and should be moved to inpatient because: Inpatient level of care appropriate due to severity of illness  Dispo: The patient is from: Home              Anticipated d/c is to: Home              Patient currently is not medically stable to d/c.   Difficult to place patient No   Consultants:  Nephrology  Procedures:   Antimicrobials:    Subjective: -Somewhat lethargic, limited responses, arousable  Objective: Vitals:   03/15/21 2113 03/16/21 0136 03/16/21 0449 03/16/21 0800  BP: 127/82 125/76 93/68 98/64   Pulse: 72 65 95 96  Resp: 20 (!) 22 (!) 22 20  Temp:   98 F (36.7 C)   TempSrc:   Oral   SpO2: 93% 95% 96% 98%  Weight:      Height:        Intake/Output Summary (Last 24 hours) at 03/16/2021 0941 Last data filed at 03/15/2021 1646 Gross per 24 hour  Intake --  Output 3500 ml  Net -3500 ml   Filed Weights   03/15/21 0512  Weight: 77.6 kg    Examination:  General exam: Chronically ill-appearing male, somnolent but arousable, oriented to self and partly place only CVS: S1-S2, regular rate rhythm Lungs: Poor air movement bilaterally Abdomen: Soft, nontender, positive fluid thrill, bowel sounds present Extremities: No edema Skin: No rash on exposed skin  Psychiatry: Flat affect.     Data Reviewed:   CBC:  Recent Labs  Lab 03/13/21 1245 03/13/21 1307 03/15/21 0500 03/15/21 0646 03/15/21 2126 03/16/21 0831  WBC 8.7  --  10.8*  --  10.4  --   NEUTROABS  --   --   --   --  8.4*  --   HGB 11.1* 11.2* 11.0* 11.6* 10.5* 10.9*  HCT 32.6* 33.0* 32.3* 34.0* 30.5* 32.0*  MCV 80.3  --  79.8*  --  78.4*  --   PLT 217  --  231  --  204  --    Basic Metabolic Panel: Recent Labs  Lab  03/13/21 1245 03/13/21 1307 03/15/21 0500 03/15/21 0646 03/15/21 2126 03/16/21 0421 03/16/21 0831  NA 132* 132* 132* 130* 135 134* 133*  K 4.3 3.7 4.2 4.3 4.3 4.7 4.7  CL 93* 93* 90*  --  95* 93*  --   CO2 25  --  26  --  29 26  --   GLUCOSE 83 86 109*  --  91 104*  --   BUN 25* 27* 33*  --  20 22*  --   CREATININE 8.01* 7.80* 9.96*  --  6.68* 7.18*  --   CALCIUM 7.3*  --  8.3*  --  8.2* 8.6*  --    GFR: Estimated Creatinine Clearance: 10.3 mL/min (A) (by C-G formula based on SCr of 7.18 mg/dL (H)). Liver Function Tests: Recent Labs  Lab 03/13/21 1245 03/15/21 0500  AST 29 25  ALT 14 15  ALKPHOS 187* 205*  BILITOT 0.8 0.8  PROT 5.6* 6.1*  ALBUMIN 2.1* 2.3*   No results for input(s): LIPASE, AMYLASE in the last 168 hours. Recent Labs  Lab 03/16/21 0839  AMMONIA 16   Coagulation Profile: No results for input(s): INR, PROTIME in the last 168 hours. Cardiac Enzymes: Recent Labs  Lab 03/15/21 0511  CKTOTAL 194   BNP (last 3 results) No results for input(s): PROBNP in the last 8760 hours. HbA1C: No results for input(s): HGBA1C in the last 72 hours. CBG: No results for input(s): GLUCAP in the last 168 hours. Lipid Profile: No results for input(s): CHOL, HDL, LDLCALC, TRIG, CHOLHDL, LDLDIRECT in the last 72 hours. Thyroid Function Tests: No results for input(s): TSH, T4TOTAL, FREET4, T3FREE, THYROIDAB in the last 72 hours. Anemia Panel: No results for input(s): VITAMINB12, FOLATE, FERRITIN, TIBC, IRON, RETICCTPCT in the last 72 hours. Urine analysis:    Component Value Date/Time   COLORURINE YELLOW 07/16/2011 1619   APPEARANCEUR CLEAR 07/16/2011 1619   LABSPEC 1.018 07/16/2011 1619   PHURINE 7.5 07/16/2011 1619   GLUCOSEU 250 (A) 07/16/2011 1619   HGBUR MODERATE (A) 07/16/2011 1619   BILIRUBINUR SMALL (A) 07/16/2011 1619   KETONESUR NEGATIVE 07/16/2011 1619   PROTEINUR >300 (A) 07/16/2011 1619   UROBILINOGEN 1.0 07/16/2011 1619   NITRITE NEGATIVE  07/16/2011 1619   LEUKOCYTESUR SMALL (A) 07/16/2011 1619   Sepsis Labs: @LABRCNTIP (procalcitonin:4,lacticidven:4)  ) Recent Results (from the past 240 hour(s))  Resp Panel by RT-PCR (Flu A&B, Covid) Nasopharyngeal Swab     Status: None   Collection Time: 03/13/21  1:10 PM   Specimen: Nasopharyngeal Swab; Nasopharyngeal(NP) swabs in vial transport medium  Result Value Ref Range Status   SARS Coronavirus 2 by RT PCR NEGATIVE NEGATIVE Final    Comment: (NOTE) SARS-CoV-2 target nucleic acids are NOT DETECTED.  The SARS-CoV-2 RNA is generally detectable in upper respiratory specimens during the acute phase of infection. The lowest concentration of SARS-CoV-2 viral copies this assay can detect is  138 copies/mL. A negative result does not preclude SARS-Cov-2 infection and should not be used as the sole basis for treatment or other patient management decisions. A negative result may occur with  improper specimen collection/handling, submission of specimen other than nasopharyngeal swab, presence of viral mutation(s) within the areas targeted by this assay, and inadequate number of viral copies(<138 copies/mL). A negative result must be combined with clinical observations, patient history, and epidemiological information. The expected result is Negative.  Fact Sheet for Patients:  EntrepreneurPulse.com.au  Fact Sheet for Healthcare Providers:  IncredibleEmployment.be  This test is no t yet approved or cleared by the [place] FDA and  has been authorized for detection and/or diagnosis of SARS-CoV-2 by FDA under an Emergency Use Authorization (EUA). This EUA will remain  in effect (meaning this test can be used) for the duration of the COVID-19 declaration under Section 564(b)(1) of the Act, 21 U.S.C.section 360bbb-3(b)(1), unless the authorization is terminated  or revoked sooner.       Influenza A by PCR NEGATIVE NEGATIVE Final   Influenza  B by PCR NEGATIVE NEGATIVE Final    Comment: (NOTE) The Xpert Xpress SARS-CoV-2/FLU/RSV plus assay is intended as an aid in the diagnosis of influenza from Nasopharyngeal swab specimens and should not be used as a sole basis for treatment. Nasal washings and aspirates are unacceptable for Xpert Xpress SARS-CoV-2/FLU/RSV testing.  Fact Sheet for Patients: EntrepreneurPulse.com.au  Fact Sheet for Healthcare Providers: IncredibleEmployment.be  This test is not yet approved or cleared by the [place] FDA and has been authorized for detection and/or diagnosis of SARS-CoV-2 by FDA under an Emergency Use Authorization (EUA). This EUA will remain in effect (meaning this test can be used) for the duration of the COVID-19 declaration under Section 564(b)(1) of the Act, 21 U.S.C. section 360bbb-3(b)(1), unless the authorization is terminated or revoked.  Performed at [facility], [place] [address], [place], [place] [address]   Resp Panel by RT-PCR (Flu A&B, Covid) Nasopharyngeal Swab     Status: None   Collection Time: 03/15/21 10:03 AM   Specimen: Nasopharyngeal Swab; Nasopharyngeal(NP) swabs in vial transport medium  Result Value Ref Range Status   SARS Coronavirus 2 by RT PCR NEGATIVE NEGATIVE Final    Comment: (NOTE) SARS-CoV-2 target nucleic acids are NOT DETECTED.  The SARS-CoV-2 RNA is generally detectable in upper respiratory specimens during the acute phase of infection. The lowest concentration of SARS-CoV-2 viral copies this assay can detect is 138 copies/mL. A negative result does not preclude SARS-Cov-2 infection and should not be used as the sole basis for treatment or other patient management decisions. A negative result may occur with  improper specimen collection/handling, submission of specimen other than nasopharyngeal swab, presence of viral mutation(s) within the areas targeted by this assay, and inadequate number of  viral copies(<138 copies/mL). A negative result must be combined with clinical observations, patient history, and epidemiological information. The expected result is Negative.  Fact Sheet for Patients:  EntrepreneurPulse.com.au  Fact Sheet for Healthcare Providers:  IncredibleEmployment.be  This test is no t yet approved or cleared by the [place] FDA and  has been authorized for detection and/or diagnosis of SARS-CoV-2 by FDA under an Emergency Use Authorization (EUA). This EUA will remain  in effect (meaning this test can be used) for the duration of the COVID-19 declaration under Section 564(b)(1) of the Act, 21 U.S.C.section 360bbb-3(b)(1), unless the authorization is terminated  or revoked sooner.       Influenza  A by PCR NEGATIVE NEGATIVE Final   Influenza B by PCR NEGATIVE NEGATIVE Final    Comment: (NOTE) The Xpert Xpress SARS-CoV-2/FLU/RSV plus assay is intended as an aid in the diagnosis of influenza from Nasopharyngeal swab specimens and should not be used as a sole basis for treatment. Nasal washings and aspirates are unacceptable for Xpert Xpress SARS-CoV-2/FLU/RSV testing.  Fact Sheet for Patients: EntrepreneurPulse.com.au  Fact Sheet for Healthcare Providers: IncredibleEmployment.be  This test is not yet approved or cleared by the [place] FDA and has been authorized for detection and/or diagnosis of SARS-CoV-2 by FDA under an Emergency Use Authorization (EUA). This EUA will remain in effect (meaning this test can be used) for the duration of the COVID-19 declaration under Section 564(b)(1) of the Act, 21 U.S.C. section 360bbb-3(b)(1), unless the authorization is terminated or revoked.  Performed at [facility], [place] [address], [place], [place] [address]          Radiology Studies: DG Chest 1 View  Result Date: 03/15/2021 CLINICAL DATA:  Dyspnea EXAM: CHEST  1  VIEW COMPARISON:  5:11 a.m. FINDINGS: Lung volumes are small and pulmonary insufflation has slightly diminished since prior examination. Mild left basilar atelectasis. No pneumothorax or pleural effusion. Cardiomegaly is stable when accounting for poor pulmonary insufflation. Central pulmonary vascular congestion has improved slightly in the interval. No overt pulmonary edema. Advanced vascular calcifications are seen within the aortic arch and arch vasculature. IMPRESSION: Pulmonary hypoinflation. Stable cardiomegaly. Improved central pulmonary vascular congestion. Peripheral vascular disease. Electronically Signed   By: Fidela Salisbury M.D.   On: 03/15/2021 22:33   DG Chest Port 1 View  Result Date: 03/15/2021 CLINICAL DATA:  Shortness of breath EXAM: PORTABLE CHEST 1 VIEW COMPARISON:  03/13/2021 radiograph and CT FINDINGS: Cardiomegaly. Extensive atheromatous calcified plaque. Low volume chest with streaky density asymmetric to the left base. Vascular congestion. Small pleural effusions. IMPRESSION: 1. Cardiomegaly and vascular congestion with small pleural effusions. 2. Atelectasis.  No significant change compared to 2 days ago. Electronically Signed   By: Monte Fantasia M.D.   On: 03/15/2021 05:36        Scheduled Meds:  amiodarone  200 mg Oral Daily   Chlorhexidine Gluconate Cloth  6 each Topical Q0600   cinacalcet  60 mg Oral BID WC   diltiazem  120 mg Oral Daily   heparin  5,000 Units Subcutaneous Q8H   ipratropium-albuterol  3 mL Nebulization Q8H   methylPREDNISolone (SOLU-MEDROL) injection  40 mg Intravenous Q12H   metoprolol succinate  12.5 mg Oral Daily   sevelamer carbonate  3,200 mg Oral TID WC   sodium chloride flush  3 mL Intravenous Q12H   Continuous Infusions:  sodium chloride     sodium chloride       LOS: 0 days   Time spent: 33min  Domenic Polite, MD [facility]   03/16/2021, 9:41 AM

## 2021-03-17 LAB — CBC
HCT: 28.3 % — ABNORMAL LOW (ref 39.0–52.0)
Hemoglobin: 9.9 g/dL — ABNORMAL LOW (ref 13.0–17.0)
MCH: 27 pg (ref 26.0–34.0)
MCHC: 35 g/dL (ref 30.0–36.0)
MCV: 77.1 fL — ABNORMAL LOW (ref 80.0–100.0)
Platelets: 201 10*3/uL (ref 150–400)
RBC: 3.67 MIL/uL — ABNORMAL LOW (ref 4.22–5.81)
RDW: 14.5 % (ref 11.5–15.5)
WBC: 11.4 10*3/uL — ABNORMAL HIGH (ref 4.0–10.5)
nRBC: 0 % (ref 0.0–0.2)

## 2021-03-17 LAB — COMPREHENSIVE METABOLIC PANEL
ALT: 13 U/L (ref 0–44)
AST: 28 U/L (ref 15–41)
Albumin: 2.5 g/dL — ABNORMAL LOW (ref 3.5–5.0)
Alkaline Phosphatase: 165 U/L — ABNORMAL HIGH (ref 38–126)
Anion gap: 12 (ref 5–15)
BUN: 16 mg/dL (ref 6–20)
CO2: 26 mmol/L (ref 22–32)
Calcium: 7.6 mg/dL — ABNORMAL LOW (ref 8.9–10.3)
Chloride: 97 mmol/L — ABNORMAL LOW (ref 98–111)
Creatinine, Ser: 4.7 mg/dL — ABNORMAL HIGH (ref 0.61–1.24)
GFR, Estimated: 14 mL/min — ABNORMAL LOW (ref >60)
Glucose, Bld: 167 mg/dL — ABNORMAL HIGH (ref 70–99)
Potassium: 3.9 mmol/L (ref 3.5–5.1)
Sodium: 135 mmol/L (ref 135–145)
Total Bilirubin: 0.6 mg/dL (ref 0.3–1.2)
Total Protein: 6.1 g/dL — ABNORMAL LOW (ref 6.5–8.1)

## 2021-03-17 MED ORDER — MIDODRINE HCL 5 MG PO TABS
5.0000 mg | ORAL_TABLET | Freq: Two times a day (BID) | ORAL | Status: DC
  Administered 2021-03-17: 5 mg via ORAL
  Filled 2021-03-17: qty 1

## 2021-03-17 MED ORDER — HYDROCODONE-ACETAMINOPHEN 5-325 MG PO TABS
1.0000 | ORAL_TABLET | Freq: Four times a day (QID) | ORAL | Status: DC | PRN
Start: 2021-03-17 — End: 2021-03-17
  Administered 2021-03-17: 1 via ORAL
  Filled 2021-03-17: qty 1

## 2021-03-17 NOTE — Progress Notes (Signed)
Dr. Broadus John notified of patient's complaints of pain. In addition, patient with marked difficulty swallowing and very hoarse voice. Does not want to not eat. Currently on renal diet.  SLP consult placed per Dr. Broadus John.   Dr. Broadus John notified of patient's complaints of pain.   Patient's mother called to speak to Nurse.  Passed along mother's phone number for patient's mother [name] to Dr. Verdell Carmine to update her when able.

## 2021-03-17 NOTE — Progress Notes (Signed)
Patient is adamant about leaving the hospital. He is stating that he has $200 missing. This RN made him aware that per his admission screening the only belongings with him were clothing and his cell phone.   Dr. Broadus John paged to make aware of patient wanting to leave AMA.
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
## 2021-03-17 NOTE — Progress Notes (Signed)
Patient is leaving AMA, PIV removed. Patient niece at bedside. She is going to take him home. Signed AMA paperwork and patient wheeled off unit by niece in wheelchair.

## 2021-03-17 NOTE — Telephone Encounter (Signed)
Noted. Patient has been hospitalized.

## 2021-03-18 ENCOUNTER — Telehealth: Payer: Self-pay

## 2021-03-18 IMAGING — US IR PARACENTESIS
1 series · 2 of 2 positions shown · non-contrast
Comparison: none

INDICATION: Patient history of hepatitis C cirrhosis end-stage renal disease
with recurrent ascites presents for therapeutic and diagnostic
paracentesis

[Series 1: ir (id) (id)/(id)/(id) ir · 2 of 2 slices shown]
[im 1/2]
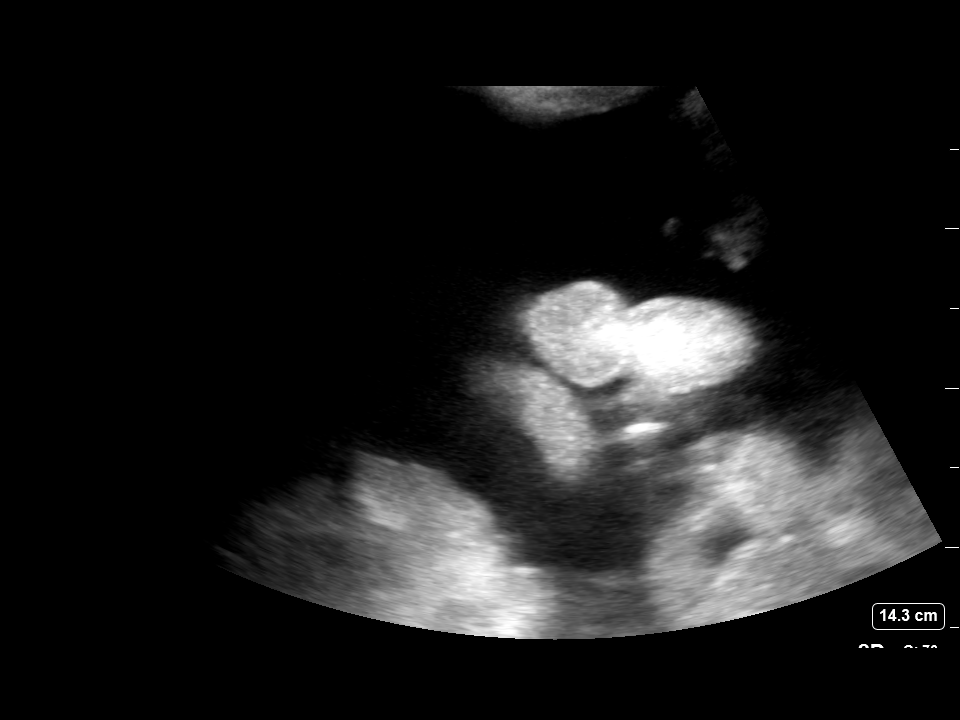
[im 2/2]
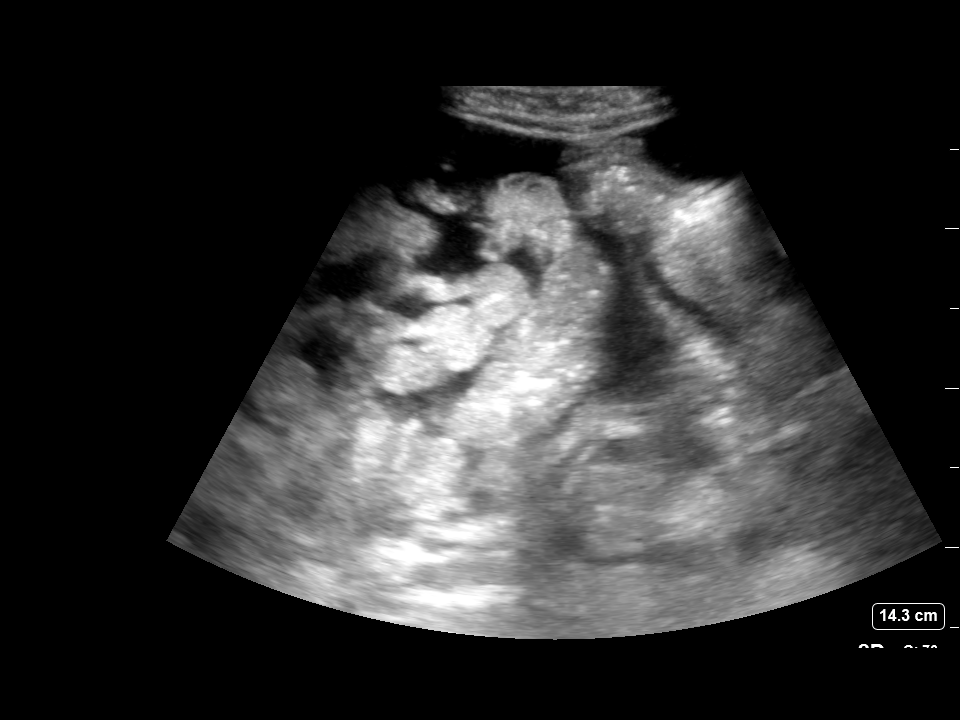

[2 of 2 positions shown; findings below may reference images not displayed]

EXAM:
ULTRASOUND GUIDED THERAPEUTIC AND DIAGNOSTIC PARACENTESIS

MEDICATIONS:
Lidocaine 1% 10 mL

COMPLICATIONS:
None immediate.

PROCEDURE:
Informed written consent was obtained from the patient after a
discussion of the risks, benefits and alternatives to treatment. A
timeout was performed prior to the initiation of the procedure.

Initial ultrasound scanning demonstrates a moderate amount of
ascites within the right lower abdominal quadrant. The right lower
abdomen was prepped and draped in the usual sterile fashion. 1%
lidocaine was used for local anesthesia.

Following this, a 19 gauge, 7-cm, Yueh catheter was introduced. An
ultrasound image was saved for documentation purposes. The
paracentesis was performed. The catheter was removed and a dressing
was applied. The patient tolerated the procedure well without
immediate post procedural complication.
FINDINGS: A total of approximately 2.8 L of straw-colored fluid was removed.
Samples were sent to the laboratory as requested by the clinical
team.
IMPRESSION: Successful ultrasound-guided therapeutic and diagnostic paracentesis
yielding 2.8 liters of peritoneal fluid.

Read by Swee Kim Tomei NP
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
## 2021-03-19 ENCOUNTER — Emergency Department (HOSPITAL_COMMUNITY): Payer: Medicare Other

## 2021-03-19 ENCOUNTER — Other Ambulatory Visit: Payer: Self-pay

## 2021-03-19 ENCOUNTER — Inpatient Hospital Stay: Payer: Medicare Other | Admitting: Physician Assistant

## 2021-03-19 ENCOUNTER — Encounter (HOSPITAL_COMMUNITY): Payer: Self-pay | Admitting: Family Medicine

## 2021-03-19 ENCOUNTER — Inpatient Hospital Stay (HOSPITAL_COMMUNITY)
Admission: EM | Admit: 2021-03-19 | Discharge: 2021-04-11 | DRG: 177 | Disposition: E | Payer: Medicare Other | Attending: Internal Medicine | Admitting: Internal Medicine

## 2021-03-19 DIAGNOSIS — U071 COVID-19: Principal | ICD-10-CM

## 2021-03-19 DIAGNOSIS — B181 Chronic viral hepatitis B without delta-agent: Secondary | ICD-10-CM | POA: Diagnosis present

## 2021-03-19 DIAGNOSIS — Z9115 Patient's noncompliance with renal dialysis: Secondary | ICD-10-CM

## 2021-03-19 DIAGNOSIS — R7989 Other specified abnormal findings of blood chemistry: Secondary | ICD-10-CM | POA: Diagnosis not present

## 2021-03-19 DIAGNOSIS — I9589 Other hypotension: Secondary | ICD-10-CM | POA: Diagnosis present

## 2021-03-19 DIAGNOSIS — Z825 Family history of asthma and other chronic lower respiratory diseases: Secondary | ICD-10-CM

## 2021-03-19 DIAGNOSIS — Z992 Dependence on renal dialysis: Secondary | ICD-10-CM | POA: Diagnosis not present

## 2021-03-19 DIAGNOSIS — F1410 Cocaine abuse, uncomplicated: Secondary | ICD-10-CM | POA: Diagnosis present

## 2021-03-19 DIAGNOSIS — E669 Obesity, unspecified: Secondary | ICD-10-CM | POA: Diagnosis present

## 2021-03-19 DIAGNOSIS — I120 Hypertensive chronic kidney disease with stage 5 chronic kidney disease or end stage renal disease: Secondary | ICD-10-CM | POA: Diagnosis present

## 2021-03-19 DIAGNOSIS — Z6829 Body mass index (BMI) 29.0-29.9, adult: Secondary | ICD-10-CM

## 2021-03-19 DIAGNOSIS — F17210 Nicotine dependence, cigarettes, uncomplicated: Secondary | ICD-10-CM | POA: Diagnosis present

## 2021-03-19 DIAGNOSIS — G928 Other toxic encephalopathy: Secondary | ICD-10-CM | POA: Diagnosis present

## 2021-03-19 DIAGNOSIS — R609 Edema, unspecified: Secondary | ICD-10-CM | POA: Diagnosis not present

## 2021-03-19 DIAGNOSIS — J811 Chronic pulmonary edema: Secondary | ICD-10-CM | POA: Diagnosis present

## 2021-03-19 DIAGNOSIS — G8929 Other chronic pain: Secondary | ICD-10-CM | POA: Diagnosis present

## 2021-03-19 DIAGNOSIS — R188 Other ascites: Secondary | ICD-10-CM | POA: Diagnosis present

## 2021-03-19 DIAGNOSIS — M5136 Other intervertebral disc degeneration, lumbar region: Secondary | ICD-10-CM | POA: Diagnosis present

## 2021-03-19 DIAGNOSIS — N186 End stage renal disease: Secondary | ICD-10-CM | POA: Diagnosis present

## 2021-03-19 DIAGNOSIS — Z801 Family history of malignant neoplasm of trachea, bronchus and lung: Secondary | ICD-10-CM

## 2021-03-19 DIAGNOSIS — J432 Centrilobular emphysema: Secondary | ICD-10-CM | POA: Diagnosis not present

## 2021-03-19 DIAGNOSIS — J1282 Pneumonia due to coronavirus disease 2019: Secondary | ICD-10-CM | POA: Diagnosis present

## 2021-03-19 DIAGNOSIS — R0600 Dyspnea, unspecified: Secondary | ICD-10-CM

## 2021-03-19 DIAGNOSIS — Z66 Do not resuscitate: Secondary | ICD-10-CM | POA: Diagnosis present

## 2021-03-19 DIAGNOSIS — K7290 Hepatic failure, unspecified without coma: Secondary | ICD-10-CM | POA: Diagnosis not present

## 2021-03-19 DIAGNOSIS — J690 Pneumonitis due to inhalation of food and vomit: Secondary | ICD-10-CM | POA: Diagnosis present

## 2021-03-19 DIAGNOSIS — I472 Ventricular tachycardia: Secondary | ICD-10-CM | POA: Diagnosis present

## 2021-03-19 DIAGNOSIS — J449 Chronic obstructive pulmonary disease, unspecified: Secondary | ICD-10-CM | POA: Diagnosis present

## 2021-03-19 DIAGNOSIS — D631 Anemia in chronic kidney disease: Secondary | ICD-10-CM | POA: Diagnosis present

## 2021-03-19 DIAGNOSIS — I050 Rheumatic mitral stenosis: Secondary | ICD-10-CM | POA: Diagnosis present

## 2021-03-19 DIAGNOSIS — F1010 Alcohol abuse, uncomplicated: Secondary | ICD-10-CM | POA: Diagnosis present

## 2021-03-19 DIAGNOSIS — G9340 Encephalopathy, unspecified: Secondary | ICD-10-CM | POA: Diagnosis present

## 2021-03-19 DIAGNOSIS — Z79899 Other long term (current) drug therapy: Secondary | ICD-10-CM

## 2021-03-19 DIAGNOSIS — F1110 Opioid abuse, uncomplicated: Secondary | ICD-10-CM | POA: Diagnosis present

## 2021-03-19 DIAGNOSIS — I4891 Unspecified atrial fibrillation: Secondary | ICD-10-CM | POA: Diagnosis present

## 2021-03-19 DIAGNOSIS — I2510 Atherosclerotic heart disease of native coronary artery without angina pectoris: Secondary | ICD-10-CM | POA: Diagnosis present

## 2021-03-19 DIAGNOSIS — K649 Unspecified hemorrhoids: Secondary | ICD-10-CM | POA: Diagnosis present

## 2021-03-19 DIAGNOSIS — K219 Gastro-esophageal reflux disease without esophagitis: Secondary | ICD-10-CM | POA: Diagnosis present

## 2021-03-19 DIAGNOSIS — I252 Old myocardial infarction: Secondary | ICD-10-CM

## 2021-03-19 DIAGNOSIS — J9811 Atelectasis: Secondary | ICD-10-CM | POA: Diagnosis present

## 2021-03-19 DIAGNOSIS — J9601 Acute respiratory failure with hypoxia: Secondary | ICD-10-CM | POA: Diagnosis present

## 2021-03-19 DIAGNOSIS — Z800 Family history of malignant neoplasm of digestive organs: Secondary | ICD-10-CM

## 2021-03-19 DIAGNOSIS — I447 Left bundle-branch block, unspecified: Secondary | ICD-10-CM | POA: Diagnosis present

## 2021-03-19 DIAGNOSIS — Z89021 Acquired absence of right finger(s): Secondary | ICD-10-CM

## 2021-03-19 DIAGNOSIS — T40601A Poisoning by unspecified narcotics, accidental (unintentional), initial encounter: Secondary | ICD-10-CM | POA: Diagnosis present

## 2021-03-19 DIAGNOSIS — M1990 Unspecified osteoarthritis, unspecified site: Secondary | ICD-10-CM | POA: Diagnosis present

## 2021-03-19 DIAGNOSIS — I482 Chronic atrial fibrillation, unspecified: Secondary | ICD-10-CM | POA: Diagnosis present

## 2021-03-19 DIAGNOSIS — Z23 Encounter for immunization: Secondary | ICD-10-CM

## 2021-03-19 DIAGNOSIS — L89312 Pressure ulcer of right buttock, stage 2: Secondary | ICD-10-CM | POA: Diagnosis present

## 2021-03-19 DIAGNOSIS — Z9119 Patient's noncompliance with other medical treatment and regimen: Secondary | ICD-10-CM

## 2021-03-19 DIAGNOSIS — E8779 Other fluid overload: Secondary | ICD-10-CM | POA: Diagnosis not present

## 2021-03-19 DIAGNOSIS — E8770 Fluid overload, unspecified: Secondary | ICD-10-CM | POA: Diagnosis present

## 2021-03-19 DIAGNOSIS — J440 Chronic obstructive pulmonary disease with acute lower respiratory infection: Secondary | ICD-10-CM | POA: Diagnosis present

## 2021-03-19 DIAGNOSIS — R9431 Abnormal electrocardiogram [ECG] [EKG]: Secondary | ICD-10-CM | POA: Diagnosis present

## 2021-03-19 DIAGNOSIS — Z808 Family history of malignant neoplasm of other organs or systems: Secondary | ICD-10-CM

## 2021-03-19 DIAGNOSIS — K7460 Unspecified cirrhosis of liver: Secondary | ICD-10-CM | POA: Diagnosis present

## 2021-03-19 DIAGNOSIS — Z8249 Family history of ischemic heart disease and other diseases of the circulatory system: Secondary | ICD-10-CM

## 2021-03-19 LAB — I-STAT CHEM 8, ED
BUN: 36 mg/dL — ABNORMAL HIGH (ref 6–20)
Calcium, Ion: 0.98 mmol/L — ABNORMAL LOW (ref 1.15–1.40)
Chloride: 100 mmol/L (ref 98–111)
Creatinine, Ser: 8.6 mg/dL — ABNORMAL HIGH (ref 0.61–1.24)
Glucose, Bld: 82 mg/dL (ref 70–99)
HCT: 35 % — ABNORMAL LOW (ref 39.0–52.0)
Hemoglobin: 11.9 g/dL — ABNORMAL LOW (ref 13.0–17.0)
Potassium: 4.7 mmol/L (ref 3.5–5.1)
Sodium: 133 mmol/L — ABNORMAL LOW (ref 135–145)
TCO2: 26 mmol/L (ref 22–32)

## 2021-03-19 LAB — COMPREHENSIVE METABOLIC PANEL
ALT: 22 U/L (ref 0–44)
AST: 31 U/L (ref 15–41)
Albumin: 2.8 g/dL — ABNORMAL LOW (ref 3.5–5.0)
Alkaline Phosphatase: 179 U/L — ABNORMAL HIGH (ref 38–126)
Anion gap: 15 (ref 5–15)
BUN: 35 mg/dL — ABNORMAL HIGH (ref 6–20)
CO2: 24 mmol/L (ref 22–32)
Calcium: 8.5 mg/dL — ABNORMAL LOW (ref 8.9–10.3)
Chloride: 95 mmol/L — ABNORMAL LOW (ref 98–111)
Creatinine, Ser: 7.98 mg/dL — ABNORMAL HIGH (ref 0.61–1.24)
GFR, Estimated: 7 mL/min — ABNORMAL LOW (ref >60)
Glucose, Bld: 82 mg/dL (ref 70–99)
Potassium: 4.9 mmol/L (ref 3.5–5.1)
Sodium: 134 mmol/L — ABNORMAL LOW (ref 135–145)
Total Bilirubin: 1.4 mg/dL — ABNORMAL HIGH (ref 0.3–1.2)
Total Protein: 6.4 g/dL — ABNORMAL LOW (ref 6.5–8.1)

## 2021-03-19 LAB — CBC WITH DIFFERENTIAL/PLATELET
Abs Immature Granulocytes: 0.06 10*3/uL (ref 0.00–0.07)
Basophils Absolute: 0.1 10*3/uL (ref 0.0–0.1)
Basophils Relative: 1 %
Eosinophils Absolute: 0 10*3/uL (ref 0.0–0.5)
Eosinophils Relative: 0 %
HCT: 34.1 % — ABNORMAL LOW (ref 39.0–52.0)
Hemoglobin: 11.3 g/dL — ABNORMAL LOW (ref 13.0–17.0)
Immature Granulocytes: 1 %
Lymphocytes Relative: 9 %
Lymphs Abs: 1.2 10*3/uL (ref 0.7–4.0)
MCH: 26.9 pg (ref 26.0–34.0)
MCHC: 33.1 g/dL (ref 30.0–36.0)
MCV: 81.2 fL (ref 80.0–100.0)
Monocytes Absolute: 0.7 10*3/uL (ref 0.1–1.0)
Monocytes Relative: 6 %
Neutro Abs: 11.1 10*3/uL — ABNORMAL HIGH (ref 1.7–7.7)
Neutrophils Relative %: 83 %
Platelets: 240 10*3/uL (ref 150–400)
RBC: 4.2 MIL/uL — ABNORMAL LOW (ref 4.22–5.81)
RDW: 14.8 % (ref 11.5–15.5)
WBC: 13.2 10*3/uL — ABNORMAL HIGH (ref 4.0–10.5)
nRBC: 0 % (ref 0.0–0.2)

## 2021-03-19 LAB — I-STAT VENOUS BLOOD GAS, ED
Acid-Base Excess: 4 mmol/L — ABNORMAL HIGH (ref 0.0–2.0)
Bicarbonate: 27.9 mmol/L (ref 20.0–28.0)
Calcium, Ion: 0.98 mmol/L — ABNORMAL LOW (ref 1.15–1.40)
HCT: 35 % — ABNORMAL LOW (ref 39.0–52.0)
Hemoglobin: 11.9 g/dL — ABNORMAL LOW (ref 13.0–17.0)
O2 Saturation: 76 %
Potassium: 5 mmol/L (ref 3.5–5.1)
Sodium: 134 mmol/L — ABNORMAL LOW (ref 135–145)
TCO2: 29 mmol/L (ref 22–32)
pCO2, Ven: 40.1 mmHg — ABNORMAL LOW (ref 44.0–60.0)
pH, Ven: 7.45 — ABNORMAL HIGH (ref 7.250–7.430)
pO2, Ven: 39 mmHg (ref 32.0–45.0)

## 2021-03-19 LAB — RESP PANEL BY RT-PCR (FLU A&B, COVID) ARPGX2
Influenza A by PCR: NEGATIVE
Influenza B by PCR: NEGATIVE
SARS Coronavirus 2 by RT PCR: POSITIVE — AB

## 2021-03-19 LAB — CBG MONITORING, ED
Glucose-Capillary: 73 mg/dL (ref 70–99)
Glucose-Capillary: 79 mg/dL (ref 70–99)

## 2021-03-19 LAB — TRIGLYCERIDES: Triglycerides: 173 mg/dL — ABNORMAL HIGH (ref <150)

## 2021-03-19 LAB — PROCALCITONIN: Procalcitonin: 1.02 ng/mL

## 2021-03-19 LAB — C-REACTIVE PROTEIN: CRP: 6 mg/dL — ABNORMAL HIGH (ref <1.0)

## 2021-03-19 LAB — AMMONIA: Ammonia: 35 umol/L (ref 9–35)

## 2021-03-19 LAB — D-DIMER, QUANTITATIVE: D-Dimer, Quant: 13.66 ug/mL-FEU — ABNORMAL HIGH (ref 0.00–0.50)

## 2021-03-19 LAB — LACTIC ACID, PLASMA
Lactic Acid, Venous: 2 mmol/L (ref 0.5–1.9)
Lactic Acid, Venous: 1.6 mmol/L (ref 0.5–1.9)

## 2021-03-19 LAB — CK: Total CK: 287 U/L (ref 49–397)

## 2021-03-19 LAB — TROPONIN I (HIGH SENSITIVITY)
Troponin I (High Sensitivity): 46 ng/L — ABNORMAL HIGH (ref <18)
Troponin I (High Sensitivity): 83 ng/L — ABNORMAL HIGH (ref <18)

## 2021-03-19 LAB — LACTATE DEHYDROGENASE: LDH: 193 U/L — ABNORMAL HIGH (ref 98–192)

## 2021-03-19 LAB — FERRITIN: Ferritin: 760 ng/mL — ABNORMAL HIGH (ref 24–336)

## 2021-03-19 LAB — FIBRINOGEN: Fibrinogen: 367 mg/dL (ref 210–475)

## 2021-03-19 LAB — LIPASE, BLOOD: Lipase: 31 U/L (ref 11–51)

## 2021-03-19 MED ORDER — SODIUM CHLORIDE 0.9 % IV SOLN: 100.0000 mL | INTRAVENOUS | Status: DC | PRN

## 2021-03-19 MED ORDER — LIDOCAINE HCL (PF) 1 % IJ SOLN
5.0000 mL | INTRAMUSCULAR | Status: DC | PRN
  Filled 2021-03-19: qty 5

## 2021-03-19 MED ORDER — SODIUM CHLORIDE 0.9 % IV SOLN
100.0000 mg | Freq: Every day | INTRAVENOUS | Status: AC
  Administered 2021-03-20 – 2021-03-23 (×4): 100 mg via INTRAVENOUS
  Filled 2021-03-19 (×4): qty 20

## 2021-03-19 MED ORDER — METHYLPREDNISOLONE SODIUM SUCC 40 MG IJ SOLR
0.5000 mg/kg | Freq: Two times a day (BID) | INTRAMUSCULAR | Status: DC
Start: 2021-03-20 — End: 2021-03-21
  Administered 2021-03-20 (×2): 36.4 mg via INTRAVENOUS
  Filled 2021-03-19 (×2): qty 1

## 2021-03-19 MED ORDER — TOCILIZUMAB 400 MG/20ML IV SOLN
8.0000 mg/kg | Freq: Once | INTRAVENOUS | Status: AC
  Administered 2021-03-20: 582 mg via INTRAVENOUS
  Filled 2021-03-19: qty 10

## 2021-03-19 MED ORDER — ALBUTEROL SULFATE HFA 108 (90 BASE) MCG/ACT IN AERS
2.0000 | INHALATION_SPRAY | Freq: Four times a day (QID) | RESPIRATORY_TRACT | Status: DC
  Administered 2021-03-19 – 2021-03-25 (×23): 2 via RESPIRATORY_TRACT
  Filled 2021-03-19 (×2): qty 6.7

## 2021-03-19 MED ORDER — SODIUM BICARBONATE 8.4 % IV SOLN
50.0000 meq | Freq: Once | INTRAVENOUS | Status: AC
  Administered 2021-03-19: 50 meq via INTRAVENOUS
  Filled 2021-03-19: qty 50

## 2021-03-19 MED ORDER — PREDNISONE 5 MG PO TABS: 50.0000 mg | ORAL_TABLET | Freq: Every day | ORAL | Status: DC

## 2021-03-19 MED ORDER — NALOXONE HCL 0.4 MG/ML IJ SOLN: 0.2000 mg | INTRAMUSCULAR | Status: DC | PRN

## 2021-03-19 MED ORDER — CALCIUM GLUCONATE-NACL 1-0.675 GM/50ML-% IV SOLN: 1.0000 g | Freq: Once | INTRAVENOUS | Status: DC

## 2021-03-19 MED ORDER — SODIUM CHLORIDE 0.9 % IV SOLN
1.0000 g | Freq: Once | INTRAVENOUS | Status: AC
  Administered 2021-03-19: 1 g via INTRAVENOUS
  Filled 2021-03-19: qty 1

## 2021-03-19 MED ORDER — LIDOCAINE-PRILOCAINE 2.5-2.5 % EX CREA: 1.0000 "application " | TOPICAL_CREAM | CUTANEOUS | Status: DC | PRN

## 2021-03-19 MED ORDER — SODIUM CHLORIDE 0.9 % IV SOLN
200.0000 mg | Freq: Once | INTRAVENOUS | Status: AC
  Administered 2021-03-19: 200 mg via INTRAVENOUS
  Filled 2021-03-19: qty 40

## 2021-03-19 MED ORDER — ALTEPLASE 2 MG IJ SOLR: 2.0000 mg | Freq: Once | INTRAMUSCULAR | Status: DC | PRN

## 2021-03-19 MED ORDER — DEXAMETHASONE SODIUM PHOSPHATE 10 MG/ML IJ SOLN
10.0000 mg | Freq: Once | INTRAMUSCULAR | Status: AC
  Administered 2021-03-19: 10 mg via INTRAVENOUS
  Filled 2021-03-19: qty 1

## 2021-03-19 MED ORDER — CHLORHEXIDINE GLUCONATE CLOTH 2 % EX PADS
6.0000 | MEDICATED_PAD | Freq: Every day | CUTANEOUS | Status: DC
  Administered 2021-03-22 – 2021-03-24 (×3): 6 via TOPICAL

## 2021-03-19 MED ORDER — HEPARIN SODIUM (PORCINE) 1000 UNIT/ML DIALYSIS: 1000.0000 [IU] | INTRAMUSCULAR | Status: DC | PRN

## 2021-03-19 MED ORDER — PENTAFLUOROPROP-TETRAFLUOROETH EX AERO: 1.0000 "application " | INHALATION_SPRAY | CUTANEOUS | Status: DC | PRN

## 2021-03-19 MED ORDER — VANCOMYCIN HCL 1500 MG/300ML IV SOLN
1500.0000 mg | Freq: Once | INTRAVENOUS | Status: AC
  Administered 2021-03-19: 1500 mg via INTRAVENOUS
  Filled 2021-03-19: qty 300

## 2021-03-19 MED ORDER — HEPARIN SODIUM (PORCINE) 5000 UNIT/ML IJ SOLN
5000.0000 [IU] | Freq: Three times a day (TID) | INTRAMUSCULAR | Status: DC
  Administered 2021-03-19 – 2021-03-25 (×14): 5000 [IU] via SUBCUTANEOUS
  Filled 2021-03-19 (×16): qty 1

## 2021-03-19 MED ORDER — METOPROLOL TARTRATE 5 MG/5ML IV SOLN: 2.5000 mg | INTRAVENOUS | Status: DC | PRN

## 2021-03-19 MED ORDER — DEXTROSE 10 % IV SOLN
INTRAVENOUS | Status: AC
  Administered 2021-03-20: 75 mL/h via INTRAVENOUS

## 2021-03-19 NOTE — ED Provider Notes (Signed)
[facility] EMERGENCY DEPARTMENT Provider Note   CSN: [id number] Arrival date & time: 04/07/2021  1634     History Chief Complaint  Patient presents with   Altered Mental Status    [name] is a 60 y.o. male hx of COPD, ascites, ESRD on HD (unclear when HD was), here presenting with altered mental status.  Patient last normal was yesterday when home health went to visit him.  He was found next to his bed today and was altered.  He was face down and was somnolent.  He was noted to be hypoxic and put on nonrebreather.  Patient is only responding to external stimuli and unable to give any history.  Patient was recently admitted to the hospital for hypoxia likely from fluid overload   The history is provided by the EMS personnel. The history is limited by the condition of the patient.  Level V caveat- AMS     Past Medical History:  Diagnosis Date   Anemia    Anxiety    Arthritis    left shoulder   Arthritis    Atherosclerosis of aorta (HCC)    Cardiomegaly    Chest pain    DATE UNKNOWN, C/O PERIODICALLY   Chest pain    Cocaine abuse (HCC)    COPD (chronic obstructive pulmonary disease) (HCC)    COPD exacerbation ([place]) 08/17/2016   Coronary artery disease    stent 02/22/17   Coronary artery disease    Dysrhythmia    ESRD (end stage renal disease) (HCC)    ESRD (end stage renal disease) on dialysis ([place])    "[name]; M-W-F" (07/04/2017)   GERD (gastroesophageal reflux disease)    DATE UNKNOWN   GERD (gastroesophageal reflux disease)    Hemorrhoids    Hepatitis B    Hepatitis B, chronic (HCC)    Hepatitis C    History of kidney stones    Hyperkalemia    Hypertension    Kidney failure    Metabolic bone disease    Patient denies   Mitral stenosis    Myocardial infarction [facility])    Pneumonia    Pulmonary edema    Renal disorder    Solitary rectal ulcer syndrome 07/2017   at flex sig for rectal bleeding   Solitary rectal ulcer syndrome     Tubular adenoma of colon     Patient Active Problem List   Diagnosis Date Noted   Acute respiratory failure with hypoxia ([place]) 03/15/2021   Hypoglycemia 03/03/2021   AMS (altered mental status) 03/03/2021   Acute encephalopathy 03/02/2021   Tylenol overdose 09/07/2020   DNR (do not resuscitate)    CAP (community acquired pneumonia) 02/09/2020   Leukocytosis 01/19/2020   Tobacco use 01/19/2020   Acute pulmonary edema ([place]) 12/21/2019   Acetaminophen overdose 10/27/2019   ESRD (end stage renal disease) ([place])    Goals of care, counseling/discussion    Hypoxia 10/04/2019   Advanced care planning/counseling discussion    Renal failure 09/26/2019   Dyspnea 06/09/2019   Acquired thrombophilia ([place]) 06/05/2019   Chronic atrial fibrillation ([place]) 05/30/2019   Atrial fibrillation with RVR ([place]) 05/29/2019   Melena    Pressure injury of skin 03/09/2019   Abdominal distention    Volume overload 12/28/2018   Sepsis ([place]) 09/12/2018   Coronary artery disease involving native coronary artery of native heart without angina pectoris 03/11/2018   Benign neoplasm of cecum    Benign neoplasm of ascending colon  Benign neoplasm of descending colon    Benign neoplasm of rectum    AF (paroxysmal atrial fibrillation) (HCC) 01/23/2018   Hx of colonic polyps 01/20/2018   End-stage renal disease on hemodialysis ([place]) 11/21/2017   GERD (gastroesophageal reflux disease) 11/16/2017   Decompensated hepatic cirrhosis ([place]) 11/15/2017   Palliative care by specialist    Hyponatremia 11/04/2017   SBP (spontaneous bacterial peritonitis) ([place]) 10/30/2017   Liver disease, chronic 10/30/2017   SOB (shortness of breath)    Abdominal pain 10/28/2017   Upper airway cough syndrome with flattening on f/v loop 10/13/17 c/w vcd 10/17/2017   Elevated diaphragm 10/13/2017   Ileus ([place]) 09/29/2017   QT prolongation 09/29/2017   Malnutrition of moderate degree 09/29/2017   Sinus congestion 09/03/2017    Symptomatic anemia 09/02/2017   Cirrhosis of liver with ascites ([place]) 09/02/2017   Left bundle branch block 09/02/2017   Mitral stenosis 09/02/2017   Hematochezia 07/15/2017   Wide-complex tachycardia ([place])    Endotracheally intubated    ESRD on dialysis ([place]) 07/04/2017   CKD (chronic kidney disease) stage V requiring chronic dialysis ([place]) 06/18/2017   History of Cocaine abuse ([place]) 06/18/2017   Hypertension 06/18/2017   Infection of AV graft for dialysis ([place]) 06/18/2017   Anxiety 06/18/2017   Anemia due to chronic kidney disease 06/18/2017   Atypical atrial flutter ([place]) 06/18/2017   Personality disorder ([place]) 06/13/2017   Cellulitis 06/12/2017   Adjustment disorder with mixed anxiety and depressed mood 06/10/2017   Suicidal ideation 06/10/2017   Arm wound, left, sequela 06/10/2017   Dyspnea on exertion 05/29/2017   Tachycardia 05/29/2017   Hyperkalemia 16/04/9603   Acute metabolic encephalopathy    Anemia 04/23/2017   Ascites 04/23/2017   COPD (chronic obstructive pulmonary disease) ([place]) 04/23/2017   Acute on chronic respiratory failure with hypoxia ([place]) 03/25/2017   Arrhythmia 03/25/2017   COPD GOLD 0 with flattening on inps f/v  09/27/2016   Essential hypertension 09/27/2016   Fluid overload 08/30/2016   COPD exacerbation ([place]) 08/17/2016   Hypertensive urgency 08/17/2016   Problem with dialysis access ([place]) 07/23/2016   Chronic hepatitis B ([place]) 03/05/2014   Chronic hepatitis C without hepatic coma ([place]) 03/05/2014   Internal hemorrhoids with bleeding, swelling and itching 03/05/2014   Thrombocytopenia ([place]) 03/05/2014   Chest pain 02/27/2014   Alcohol abuse 04/14/2009   Nicotine dependence, cigarettes, uncomplicated 54/03/8118   GANGLION CYST 04/14/2009    Past Surgical History:  Procedure Laterality Date   A/V FISTULAGRAM Left 05/26/2017   Procedure: A/V FISTULAGRAM;  Surgeon: Conrad Woodland, MD;  Location: [place] [facility];  Service: Cardiovascular;   Laterality: Left;   A/V FISTULAGRAM Right 11/18/2017   Procedure: A/V FISTULAGRAM - Right Arm;  Surgeon: Elam Dutch, MD;  Location: [place] [facility];  Service: Cardiovascular;  Laterality: Right;   AMPUTATION Right 09/04/2020   Procedure: RIGHT INDEX AND RIGHT RING FINGER AMPUTATION DIGIT;  Surgeon: Leanora Cover, MD;  Location: [place];  Service: Orthopedics;  Laterality: Right;   AMPUTATION Right 11/13/2020   Procedure: RIGHT INDEX FINGER AMPUTATION AND RIGHT RING FINGER AMPUTATION;  Surgeon: Leanora Cover, MD;  Location: [place];  Service: Orthopedics;  Laterality: Right;   APPLICATION OF WOUND VAC Left 06/14/2017   Procedure: APPLICATION OF WOUND VAC;  Surgeon: Katha Cabal, MD;  Location: [facility];  Service: Vascular;  Laterality: Left;   AV FISTULA PLACEMENT  2012   BELIEVED WAS PLACED IN JUNE   AV FISTULA PLACEMENT Right  08/09/2017   Procedure: Creation Right arm ARTERIOVENOUS BRACHIOCEPOHALIC FISTULA;  Surgeon: Elam Dutch, MD;  Location: [facility];  Service: Vascular;  Laterality: Right;   AV FISTULA PLACEMENT Right 11/22/2017   Procedure: INSERTION OF ARTERIOVENOUS (AV) GORE-TEX GRAFT RIGHT UPPER ARM;  Surgeon: Elam Dutch, MD;  Location: [place];  Service: Vascular;  Laterality: Right;   BIOPSY  01/25/2018   Procedure: BIOPSY;  Surgeon: Jerene Bears, MD;  Location: [place];  Service: Gastroenterology;;   BIOPSY  04/10/2019   Procedure: BIOPSY;  Surgeon: Jerene Bears, MD;  Location: [facility];  Service: Gastroenterology;;   COLONOSCOPY     COLONOSCOPY WITH PROPOFOL N/A 01/25/2018   Procedure: COLONOSCOPY WITH PROPOFOL;  Surgeon: Jerene Bears, MD;  Location: [place];  Service: Gastroenterology;  Laterality: N/A;   CORONARY STENT INTERVENTION N/A 02/22/2017   Procedure: CORONARY STENT INTERVENTION;  Surgeon: Nigel Mormon, MD;  Location: [place] [facility];  Service: Cardiovascular;  Laterality: N/A;   ESOPHAGOGASTRODUODENOSCOPY (EGD) WITH PROPOFOL N/A  01/25/2018   Procedure: ESOPHAGOGASTRODUODENOSCOPY (EGD) WITH PROPOFOL;  Surgeon: Jerene Bears, MD;  Location: [place];  Service: Gastroenterology;  Laterality: N/A;   ESOPHAGOGASTRODUODENOSCOPY (EGD) WITH PROPOFOL N/A 04/10/2019   Procedure: ESOPHAGOGASTRODUODENOSCOPY (EGD) WITH PROPOFOL;  Surgeon: Jerene Bears, MD;  Location: [facility];  Service: Gastroenterology;  Laterality: N/A;   FLEXIBLE SIGMOIDOSCOPY N/A 07/15/2017   Procedure: FLEXIBLE SIGMOIDOSCOPY;  Surgeon: Carol Ada, MD;  Location: [place];  Service: Endoscopy;  Laterality: N/A;   HEMORRHOID BANDING     I & D EXTREMITY Left 06/01/2017   Procedure: IRRIGATION AND DEBRIDEMENT LEFT ARM HEMATOMA WITH LIGATION OF LEFT ARM AV FISTULA;  Surgeon: Elam Dutch, MD;  Location: [place];  Service: Vascular;  Laterality: Left;   I & D EXTREMITY Left 06/14/2017   Procedure: IRRIGATION AND DEBRIDEMENT EXTREMITY;  Surgeon: Katha Cabal, MD;  Location: [facility];  Service: Vascular;  Laterality: Left;   INSERTION OF DIALYSIS CATHETER  05/30/2017   INSERTION OF DIALYSIS CATHETER N/A 05/30/2017   Procedure: INSERTION OF DIALYSIS CATHETER;  Surgeon: Elam Dutch, MD;  Location: [place];  Service: Vascular;  Laterality: N/A;   IR PARACENTESIS  08/30/2017   IR PARACENTESIS  09/29/2017   IR PARACENTESIS  10/28/2017   IR PARACENTESIS  11/09/2017   IR PARACENTESIS  11/16/2017   IR PARACENTESIS  11/28/2017   IR PARACENTESIS  12/01/2017   IR PARACENTESIS  12/06/2017   IR PARACENTESIS  01/03/2018   IR PARACENTESIS  01/23/2018   IR PARACENTESIS  02/07/2018   IR PARACENTESIS  02/21/2018   IR PARACENTESIS  03/06/2018   IR PARACENTESIS  03/17/2018   IR PARACENTESIS  04/04/2018   IR PARACENTESIS  12/28/2018   IR PARACENTESIS  01/08/2019   IR PARACENTESIS  01/23/2019   IR PARACENTESIS  02/01/2019   IR PARACENTESIS  02/19/2019   IR PARACENTESIS  03/01/2019   IR PARACENTESIS  03/15/2019   IR PARACENTESIS  04/03/2019   IR PARACENTESIS  04/12/2019   IR  PARACENTESIS  05/01/2019   IR PARACENTESIS  05/08/2019   IR PARACENTESIS  05/24/2019   IR PARACENTESIS  06/12/2019   IR PARACENTESIS  07/09/2019   IR PARACENTESIS  07/27/2019   IR PARACENTESIS  08/09/2019   IR PARACENTESIS  08/21/2019   IR PARACENTESIS  09/17/2019   IR PARACENTESIS  10/05/2019   IR PARACENTESIS  10/29/2019   IR PARACENTESIS  11/08/2019   IR PARACENTESIS  12/12/2019  IR PARACENTESIS  01/03/2020   IR PARACENTESIS  01/10/2020   IR PARACENTESIS  01/17/2020   IR PARACENTESIS  01/24/2020   IR PARACENTESIS  01/31/2020   IR PARACENTESIS  02/07/2020   IR PARACENTESIS  02/21/2020   IR PARACENTESIS  05/21/2020   IR PARACENTESIS  10/10/2020   IR PARACENTESIS  10/28/2020   IR PARACENTESIS  11/18/2020   IR PARACENTESIS  12/02/2020   IR PARACENTESIS  12/11/2020   IR PARACENTESIS  12/18/2020   IR PARACENTESIS  12/30/2020   IR PARACENTESIS  01/06/2021   IR PARACENTESIS  01/13/2021   IR PARACENTESIS  01/20/2021   IR PARACENTESIS  01/27/2021   IR PARACENTESIS  02/03/2021   IR PARACENTESIS  02/10/2021   IR PARACENTESIS  02/17/2021   IR PARACENTESIS  02/24/2021   IR PARACENTESIS  03/10/2021   IR RADIOLOGIST EVAL & MGMT  02/14/2018   IR RADIOLOGIST EVAL & MGMT  02/22/2019   LEFT HEART CATH AND CORONARY ANGIOGRAPHY N/A 02/22/2017   Procedure: LEFT HEART CATH AND CORONARY ANGIOGRAPHY;  Surgeon: Nigel Mormon, MD;  Location: [place] [facility];  Service: Cardiovascular;  Laterality: N/A;   LIGATION OF ARTERIOVENOUS  FISTULA Left 02/12/3824   Procedure: Plication of Left Arm Arteriovenous Fistula;  Surgeon: Elam Dutch, MD;  Location: [facility] OR;  Service: Vascular;  Laterality: Left;   POLYPECTOMY     POLYPECTOMY  01/25/2018   Procedure: POLYPECTOMY;  Surgeon: Jerene Bears, MD;  Location: [place];  Service: Gastroenterology;;   REVISON OF ARTERIOVENOUS FISTULA Left 0/53/9767   Procedure: PLICATION OF DISTAL ANEURYSMAL SEGEMENT OF LEFT UPPER ARM ARTERIOVENOUS FISTULA;  Surgeon: Elam Dutch, MD;  Location: [place];  Service: Vascular;  Laterality: Left;   REVISON OF ARTERIOVENOUS FISTULA Left 3/41/9379   Procedure: Plication of Left Upper Arm Fistula ;  Surgeon: Waynetta Sandy, MD;  Location: [place];  Service: Vascular;  Laterality: Left;   SKIN GRAFT SPLIT THICKNESS LEG / FOOT Left    SKIN GRAFT SPLIT THICKNESS LEFT ARM DONOR SITE: LEFT ANTERIOR THIGH   SKIN SPLIT GRAFT Left 07/04/2017   Procedure: SKIN GRAFT SPLIT THICKNESS LEFT ARM DONOR SITE: LEFT ANTERIOR THIGH;  Surgeon: Elam Dutch, MD;  Location: [place];  Service: Vascular;  Laterality: Left;   THROMBECTOMY W/ EMBOLECTOMY Left 06/05/2017   Procedure: EXPLORATION OF LEFT ARM FOR BLEEDING; OVERSEWED PROXIMAL FISTULA;  Surgeon: Angelia Mould, MD;  Location: [place];  Service: Vascular;  Laterality: Left;   WOUND EXPLORATION Left 06/03/2017   Procedure: WOUND EXPLORATION WITH WOUND VAC APPLICATION TO LEFT ARM;  Surgeon: Angelia Mould, MD;  Location: [facility] OR;  Service: Vascular;  Laterality: Left;       Family History  Problem Relation Age of Onset   Heart disease Mother    Lung cancer Mother    Heart disease Father    Malignant hyperthermia Father    COPD Father    Throat cancer Sister    Esophageal cancer Sister    Hypertension Other    COPD Other    Throat cancer Sister    Colon cancer Neg Hx    Colon polyps Neg Hx    Rectal cancer Neg Hx    Stomach cancer Neg Hx     Social History   Tobacco Use   Smoking status: Every Day    Packs/day: 0.50    Years: 43.00    Pack years: 21.50    Types: Cigarettes    Start  date: 08/13/1973   Smokeless tobacco: Never  Vaping Use   Vaping Use: Never used  Substance Use Topics   Alcohol use: Not Currently    Comment: quit drinking in 2017   Drug use: Yes    Types: Marijuana, Cocaine    Comment:  Quit Cocaine 2020, Last use Marijuana 11/08/20    Home Medications Prior to Admission medications   Medication Sig Start Date End Date Taking? Authorizing Provider   acetaminophen (TYLENOL) 500 MG tablet Take 500-1,000 mg by mouth every 6 (six) hours as needed (pain.).    [provider]  albuterol (VENTOLIN HFA) 108 (90 Base) MCG/ACT inhaler INHALE 2 PUFFS BY MOUTH EVERY 4 HOURS AS NEEDED FOR WHEEZE OR FOR SHORTNESS OF BREATH Patient taking differently: Inhale 2 puffs into the lungs every 4 (four) hours as needed for wheezing or shortness of breath. 02/06/21   Cantwell, Celeste C, PA-C  amiodarone (PACERONE) 200 MG tablet TAKE 1 TABLET BY MOUTH EVERY DAY Patient taking differently: Take 200 mg by mouth in the morning. 09/29/20   Cantwell, Celeste C, PA-C  cetirizine (ZYRTEC) 10 MG tablet Take 10 mg by mouth daily. 06/23/20   [provider]  cinacalcet (SENSIPAR) 30 MG tablet TAKE 2 TABLETS (60 MG TOTAL) BY MOUTH EVERY MONDAY, WEDNESDAY, AND FRIDAY WITH HEMODIALYSIS. Patient taking differently: Take 60 mg by mouth 2 (two) times daily with a meal. Takes when he eats 09/12/20 09/12/21  Florencia Reasons, MD  diltiazem (CARDIZEM CD) 120 MG 24 hr capsule TAKE 1 CAPSULE BY MOUTH EVERY DAY Patient taking differently: Take 120 mg by mouth daily. 12/15/20   Adrian Prows, MD  gabapentin (NEURONTIN) 300 MG capsule TAKE 1 CAPSULE BY MOUTH IN THE MORNING AND AT BEDTIME Patient taking differently: Take 300 mg by mouth 2 (two) times daily. 11/14/20   Charlott Rakes, MD  lactulose (CHRONULAC) 10 GM/15ML solution Take 30 mLs (20 g total) by mouth daily. Patient taking differently: Take 20 g by mouth daily as needed (constipation). 09/12/20   Florencia Reasons, MD  lidocaine-prilocaine (EMLA) cream Apply 1 application topically every Monday, Wednesday, and Friday with hemodialysis. Applied sparingly for port access 11/01/20   [provider]  metoprolol succinate (TOPROL-XL) 25 MG 24 hr tablet Take 0.5 tablets (12.5 mg total) by mouth daily. Patient taking differently: Take 25 mg by mouth daily. 01/29/21   Cantwell, Celeste C, PA-C  ondansetron (ZOFRAN-ODT) 4 MG disintegrating  tablet Take 4 mg by mouth at bedtime as needed for nausea. 02/07/21   [provider]  oxyCODONE (ROXICODONE) 5 MG immediate release tablet 1 tab PO q6 hours prn pain Patient not taking: No sig reported 11/13/20   Leanora Cover, MD  pantoprazole (PROTONIX) 40 MG tablet Take 40 mg by mouth daily.    [provider]  polyethylene glycol (MIRALAX / GLYCOLAX) 17 g packet Take 17 g by mouth daily as needed (constipation).    [provider]  sevelamer carbonate (RENVELA) 800 MG tablet Take 1,600-3,200 mg by mouth See admin instructions. Taking 4 tablets (3200 mg) three times daily with meals and 2 tabs (1600 mg) with snacks.    [provider]    Allergies    Tramadol, Grass extracts [gramineae pollens], Morphine and related, Pollen extract, Acetaminophen, Aspirin, and Clonidine derivatives  Review of Systems   Review of Systems  Unable to perform ROS: Mental status change  All other systems reviewed and are negative.  Physical Exam Updated Vital Signs There were no vitals taken  for this visit.  Physical Exam Vitals and nursing note reviewed.  Constitutional:      Comments: Altered and confused  HENT:     Head: Normocephalic.     Comments: Bruising on the face and forehead and swollen lips    Nose: Nose normal.     Mouth/Throat:     Mouth: Mucous membranes are dry.  Eyes:     Extraocular Movements: Extraocular movements intact.     Pupils: Pupils are equal, round, and reactive to light.  Cardiovascular:     Rate and Rhythm: Tachycardia present.     Pulses: Normal pulses.  Pulmonary:     Comments: Crackles in bilateral bases Abdominal:     General: Abdomen is flat.     Palpations: Abdomen is soft.  Musculoskeletal:     Cervical back: Normal range of motion.     Comments: No obvious extremity trauma  Skin:    Capillary Refill: Capillary refill takes less than 2 seconds.  Neurological:     Comments: Responding only to painful stimuli but moving  all extremities with pain  Psychiatric:     Comments: Unable    ED Results / Procedures / Treatments   Labs (all labs ordered are listed, but only abnormal results are displayed) Labs Reviewed  CULTURE, BLOOD (ROUTINE X 2)  CULTURE, BLOOD (ROUTINE X 2)  RESP PANEL BY RT-PCR (FLU A&B, COVID) ARPGX2  CBC WITH DIFFERENTIAL/PLATELET  COMPREHENSIVE METABOLIC PANEL  LIPASE, BLOOD  AMMONIA  CK  LACTIC ACID, PLASMA  LACTIC ACID, PLASMA  BLOOD GAS, VENOUS  CBG MONITORING, ED  I-STAT CHEM 8, ED  TROPONIN I (HIGH SENSITIVITY)    EKG EKG Interpretation  Date/Time:  Thursday March 19 2021 16:39:05 EDT Ventricular Rate:  130 PR Interval:    QRS Duration: 160 QT Interval:  362 QTC Calculation: 533 R Axis:   224 Text Interpretation: Atrial fibrillation Consider left ventricular hypertrophy Anterior Q waves, possibly due to LVH Abnormal T, consider ischemia, lateral leads Prolonged QT interval peaked T waves and prolonged QTc new since previous Confirmed by Wandra Arthurs [id number]) on 03/26/2021 4:55:48 PM  Radiology No results found.  Procedures Procedures   CRITICAL CARE Performed by: Wandra Arthurs   Total critical care time: 30 minutes  Critical care time was exclusive of separately billable procedures and treating other patients.  Critical care was necessary to treat or prevent imminent or life-threatening deterioration.  Critical care was time spent personally by me on the following activities: development of treatment plan with patient and/or surrogate as well as nursing, discussions with consultants, evaluation of patient's response to treatment, examination of patient, obtaining history from patient or surrogate, ordering and performing treatments and interventions, ordering and review of laboratory studies, ordering and review of radiographic studies, pulse oximetry and re-evaluation of patient's condition.   Medications Ordered in ED Medications  calcium gluconate 1 g/  50 mL sodium chloride IVPB (has no administration in time range)  sodium bicarbonate injection 50 mEq (has no administration in time range)    ED Course  I have reviewed the triage vital signs and the nursing notes.  Pertinent labs & imaging results that were available during my care of the patient were reviewed by me and considered in my medical decision making (see chart for details).    MDM Rules/Calculators/A&P                          Lillia Abed  Syme is a 60 y.o. male here with altered mental status and hypoxia.  Patient appears volume overloaded.  Is unclear when his last dialysis was.  He also has new peaked T waves as well as prolonged QT.  I suspect that he is altered from hyperkalemia versus head bleed versus other electrolyte abnormalities versus sepsis.  Plan to get CT head and neck and also get chest x-ray and CBC and CMP and troponin and lactate and cultures.    8:07 PM Labs showed normal ammonia level, K 4.7. CT head unremarkable. COVID positive. Since patient is on non rebreather, I ordered steroids. Pharmacy recommend Tocilizumab instead of remdesivir. Hospitalist to admit for hypoxia from [place]. Nephrology will see patient tomorrow as he doesn't need emergent dialysis tonight.  I discussed CODE STATUS with patient's sister who verified that patient is DNR and DNI.   Final Clinical Impression(s) / ED Diagnoses Final diagnoses:  None    Rx / DC Orders ED Discharge Orders     None        Drenda Freeze, MD 04/03/2021 2231

## 2021-03-19 NOTE — Progress Notes (Deleted)
Patient ID: [name], male   DOB: 1961/02/09, 60 y.o.   MRN: [id number]

## 2021-03-19 NOTE — H&P (Signed)
History and Physical    [name] GMW:[id number] DOB: 1961/05/11 DOA: 03/21/2021  PCP: Charlott Rakes, MD   Patient coming from: Home   Chief Complaint: Found down, somnolent, hypoxic   HPI: [name] is a 60 y.o. male with medical history significant for cirrhosis with ascites, ESRD on hemodialysis, coronary artery disease, chronic pain, substance abuse, COPD, and poor adherence with his medical treatment plan, now presenting to the emergency department after he was found down, somnolent, and hypoxic.  Patient was reportedly seen in his usual state yesterday by home health but they found him lying face down next to his bed today.  EMS was called out, patient was found to be hypoxic, somnolent, was started on supplemental oxygen, and brought into the ED.  He is unable to contribute much to the history in the emergency department.  His sister (POA) was reached by phone and notes that the patient has been abusing his prescription oxycodone and that she also believes she obtains additional opiates illicitly.  She highly suspects opiate abuse to be the cause of his somnolence today.  ED Course: Upon arrival to the ED, patient is found to be afebrile, saturating mid 90s on nonrebreather, mildly tachypneic, tachycardic to 440, and with systolic blood pressure in the 90s to low 100s.  EKG features atrial fibrillation with rate 130 and QTc 533.  Chest x-ray notable for cardiomegaly with vascular congestion and patchy multifocal airspace opacities concerning for infection.  CT of the chest does not reveal conspicuous pneumonia but findings more consistent with atelectasis.  CT of the head, cervical spine, and maxillofacial were limited by motion but negative for acute findings.  Nephrology was consulted by the ED physician, blood cultures were obtained, and the patient was treated with Decadron, bicarbonate, remdesivir, Actemra, cefepime, and vancomycin.  Review of Systems:  All other  systems reviewed and apart from HPI, are negative.  Past Medical History:  Diagnosis Date   Anemia    Anxiety    Arthritis    left shoulder   Arthritis    Atherosclerosis of aorta (HCC)    Cardiomegaly    Chest pain    DATE UNKNOWN, C/O PERIODICALLY   Chest pain    Cocaine abuse (HCC)    COPD (chronic obstructive pulmonary disease) (HCC)    COPD exacerbation ([place]) 08/17/2016   Coronary artery disease    stent 02/22/17   Coronary artery disease    Dysrhythmia    ESRD (end stage renal disease) ([place])    ESRD (end stage renal disease) on dialysis ([place])    "[name]; M-W-F" (07/04/2017)   GERD (gastroesophageal reflux disease)    DATE UNKNOWN   GERD (gastroesophageal reflux disease)    Hemorrhoids    Hepatitis B    Hepatitis B, chronic (HCC)    Hepatitis C    History of kidney stones    Hyperkalemia    Hypertension    Kidney failure    Metabolic bone disease    Patient denies   Mitral stenosis    Myocardial infarction [facility])    Pneumonia    Pulmonary edema    Renal disorder    Solitary rectal ulcer syndrome 07/2017   at flex sig for rectal bleeding   Solitary rectal ulcer syndrome    Tubular adenoma of colon     Past Surgical History:  Procedure Laterality Date   A/V FISTULAGRAM Left 05/26/2017   Procedure: A/V FISTULAGRAM;  Surgeon: Conrad Itta Bena, MD;  Location:  [place] [facility];  Service: Cardiovascular;  Laterality: Left;   A/V FISTULAGRAM Right 11/18/2017   Procedure: A/V FISTULAGRAM - Right Arm;  Surgeon: Elam Dutch, MD;  Location: [place] [facility];  Service: Cardiovascular;  Laterality: Right;   AMPUTATION Right 09/04/2020   Procedure: RIGHT INDEX AND RIGHT RING FINGER AMPUTATION DIGIT;  Surgeon: Leanora Cover, MD;  Location: [place];  Service: Orthopedics;  Laterality: Right;   AMPUTATION Right 11/13/2020   Procedure: RIGHT INDEX FINGER AMPUTATION AND RIGHT RING FINGER AMPUTATION;  Surgeon: Leanora Cover, MD;  Location: [place];  Service: Orthopedics;   Laterality: Right;   APPLICATION OF WOUND VAC Left 06/14/2017   Procedure: APPLICATION OF WOUND VAC;  Surgeon: Katha Cabal, MD;  Location: [facility];  Service: Vascular;  Laterality: Left;   AV FISTULA PLACEMENT  2012   BELIEVED WAS PLACED IN JUNE   AV FISTULA PLACEMENT Right 08/09/2017   Procedure: Creation Right arm ARTERIOVENOUS BRACHIOCEPOHALIC FISTULA;  Surgeon: Elam Dutch, MD;  Location: [facility] OR;  Service: Vascular;  Laterality: Right;   AV FISTULA PLACEMENT Right 11/22/2017   Procedure: INSERTION OF ARTERIOVENOUS (AV) GORE-TEX GRAFT RIGHT UPPER ARM;  Surgeon: Elam Dutch, MD;  Location: [place];  Service: Vascular;  Laterality: Right;   BIOPSY  01/25/2018   Procedure: BIOPSY;  Surgeon: Jerene Bears, MD;  Location: [place];  Service: Gastroenterology;;   BIOPSY  04/10/2019   Procedure: BIOPSY;  Surgeon: Jerene Bears, MD;  Location: [facility];  Service: Gastroenterology;;   COLONOSCOPY     COLONOSCOPY WITH PROPOFOL N/A 01/25/2018   Procedure: COLONOSCOPY WITH PROPOFOL;  Surgeon: Jerene Bears, MD;  Location: [place];  Service: Gastroenterology;  Laterality: N/A;   CORONARY STENT INTERVENTION N/A 02/22/2017   Procedure: CORONARY STENT INTERVENTION;  Surgeon: Nigel Mormon, MD;  Location: [place] [facility];  Service: Cardiovascular;  Laterality: N/A;   ESOPHAGOGASTRODUODENOSCOPY (EGD) WITH PROPOFOL N/A 01/25/2018   Procedure: ESOPHAGOGASTRODUODENOSCOPY (EGD) WITH PROPOFOL;  Surgeon: Jerene Bears, MD;  Location: [place];  Service: Gastroenterology;  Laterality: N/A;   ESOPHAGOGASTRODUODENOSCOPY (EGD) WITH PROPOFOL N/A 04/10/2019   Procedure: ESOPHAGOGASTRODUODENOSCOPY (EGD) WITH PROPOFOL;  Surgeon: Jerene Bears, MD;  [facility]: [facility];  Service: Gastroenterology;  Laterality: N/A;   FLEXIBLE SIGMOIDOSCOPY N/A 07/15/2017   Procedure: FLEXIBLE SIGMOIDOSCOPY;  Surgeon: Carol Ada, MD;  Location: [place];  Service: Endoscopy;  Laterality: N/A;    HEMORRHOID BANDING     I & D EXTREMITY Left 06/01/2017   Procedure: IRRIGATION AND DEBRIDEMENT LEFT ARM HEMATOMA WITH LIGATION OF LEFT ARM AV FISTULA;  Surgeon: Elam Dutch, MD;  Location: [place];  Service: Vascular;  Laterality: Left;   I & D EXTREMITY Left 06/14/2017   Procedure: IRRIGATION AND DEBRIDEMENT EXTREMITY;  Surgeon: Katha Cabal, MD;  Location: [facility];  Service: Vascular;  Laterality: Left;   INSERTION OF DIALYSIS CATHETER  05/30/2017   INSERTION OF DIALYSIS CATHETER N/A 05/30/2017   Procedure: INSERTION OF DIALYSIS CATHETER;  Surgeon: Elam Dutch, MD;  Location: [place];  Service: Vascular;  Laterality: N/A;   IR PARACENTESIS  08/30/2017   IR PARACENTESIS  09/29/2017   IR PARACENTESIS  10/28/2017   IR PARACENTESIS  11/09/2017   IR PARACENTESIS  11/16/2017   IR PARACENTESIS  11/28/2017   IR PARACENTESIS  12/01/2017   IR PARACENTESIS  12/06/2017   IR PARACENTESIS  01/03/2018   IR PARACENTESIS  01/23/2018   IR PARACENTESIS  02/07/2018  IR PARACENTESIS  02/21/2018   IR PARACENTESIS  03/06/2018   IR PARACENTESIS  03/17/2018   IR PARACENTESIS  04/04/2018   IR PARACENTESIS  12/28/2018   IR PARACENTESIS  01/08/2019   IR PARACENTESIS  01/23/2019   IR PARACENTESIS  02/01/2019   IR PARACENTESIS  02/19/2019   IR PARACENTESIS  03/01/2019   IR PARACENTESIS  03/15/2019   IR PARACENTESIS  04/03/2019   IR PARACENTESIS  04/12/2019   IR PARACENTESIS  05/01/2019   IR PARACENTESIS  05/08/2019   IR PARACENTESIS  05/24/2019   IR PARACENTESIS  06/12/2019   IR PARACENTESIS  07/09/2019   IR PARACENTESIS  07/27/2019   IR PARACENTESIS  08/09/2019   IR PARACENTESIS  08/21/2019   IR PARACENTESIS  09/17/2019   IR PARACENTESIS  10/05/2019   IR PARACENTESIS  10/29/2019   IR PARACENTESIS  11/08/2019   IR PARACENTESIS  12/12/2019   IR PARACENTESIS  01/03/2020   IR PARACENTESIS  01/10/2020   IR PARACENTESIS  01/17/2020   IR PARACENTESIS  01/24/2020   IR PARACENTESIS  01/31/2020   IR PARACENTESIS  02/07/2020   IR  PARACENTESIS  02/21/2020   IR PARACENTESIS  05/21/2020   IR PARACENTESIS  10/10/2020   IR PARACENTESIS  10/28/2020   IR PARACENTESIS  11/18/2020   IR PARACENTESIS  12/02/2020   IR PARACENTESIS  12/11/2020   IR PARACENTESIS  12/18/2020   IR PARACENTESIS  12/30/2020   IR PARACENTESIS  01/06/2021   IR PARACENTESIS  01/13/2021   IR PARACENTESIS  01/20/2021   IR PARACENTESIS  01/27/2021   IR PARACENTESIS  02/03/2021   IR PARACENTESIS  02/10/2021   IR PARACENTESIS  02/17/2021   IR PARACENTESIS  02/24/2021   IR PARACENTESIS  03/10/2021   IR RADIOLOGIST EVAL & MGMT  02/14/2018   IR RADIOLOGIST EVAL & MGMT  02/22/2019   LEFT HEART CATH AND CORONARY ANGIOGRAPHY N/A 02/22/2017   Procedure: LEFT HEART CATH AND CORONARY ANGIOGRAPHY;  Surgeon: Nigel Mormon, MD;  Location: [place] [facility];  Service: Cardiovascular;  Laterality: N/A;   LIGATION OF ARTERIOVENOUS  FISTULA Left 09/17/2503   Procedure: Plication of Left Arm Arteriovenous Fistula;  Surgeon: Elam Dutch, MD;  [facility];  Service: Vascular;  Laterality: Left;   POLYPECTOMY     POLYPECTOMY  01/25/2018   Procedure: POLYPECTOMY;  Surgeon: Jerene Bears, MD;  Location: [place];  Service: Gastroenterology;;   REVISON OF ARTERIOVENOUS FISTULA Left 3/97/6734   Procedure: PLICATION OF DISTAL ANEURYSMAL SEGEMENT OF LEFT UPPER ARM ARTERIOVENOUS FISTULA;  Surgeon: Elam Dutch, MD;  Location: [place];  Service: Vascular;  Laterality: Left;   REVISON OF ARTERIOVENOUS FISTULA Left 1/93/7902   Procedure: Plication of Left Upper Arm Fistula ;  Surgeon: Waynetta Sandy, MD;  Location: [place];  Service: Vascular;  Laterality: Left;   SKIN GRAFT SPLIT THICKNESS LEG / FOOT Left    SKIN GRAFT SPLIT THICKNESS LEFT ARM DONOR SITE: LEFT ANTERIOR THIGH   SKIN SPLIT GRAFT Left 07/04/2017   Procedure: SKIN GRAFT SPLIT THICKNESS LEFT ARM DONOR SITE: LEFT ANTERIOR THIGH;  Surgeon: Elam Dutch, MD;  Location: [place];  Service: Vascular;  Laterality: Left;    THROMBECTOMY W/ EMBOLECTOMY Left 06/05/2017   Procedure: EXPLORATION OF LEFT ARM FOR BLEEDING; OVERSEWED PROXIMAL FISTULA;  Surgeon: Angelia Mould, MD;  Location: [place];  Service: Vascular;  Laterality: Left;   WOUND EXPLORATION Left 06/03/2017   Procedure: WOUND EXPLORATION WITH WOUND VAC APPLICATION TO  LEFT ARM;  Surgeon: Angelia Mould, MD;  Location: [facility] OR;  Service: Vascular;  Laterality: Left;    Social History:   reports that he has been smoking cigarettes. He started smoking about 47 years ago. He has a 21.50 pack-year smoking history. He has never used smokeless tobacco. He reports that he does not currently use alcohol. He reports current drug use. Drugs: Marijuana and Cocaine.  Allergies  Allergen Reactions   Tramadol Itching and Other (See Comments)    Other reaction(s): Unknown   Grass Extracts [Gramineae Pollens]     Other reaction(s): Sneezing   Morphine And Related Other (See Comments)    Stomach pain   Pollen Extract Other (See Comments)    Other reaction(s): Sneezing (finding)   Acetaminophen Nausea Only    Stomach ache    Aspirin Itching and Other (See Comments)    STOMACH PAIN  Other reaction(s): Unknown   Clonidine Derivatives Itching    Family History  Problem Relation Age of Onset   Heart disease Mother    Lung cancer Mother    Heart disease Father    Malignant hyperthermia Father    COPD Father    Throat cancer Sister    Esophageal cancer Sister    Hypertension Other    COPD Other    Throat cancer Sister    Colon cancer Neg Hx    Colon polyps Neg Hx    Rectal cancer Neg Hx    Stomach cancer Neg Hx      Prior to Admission medications   Medication Sig Start Date End Date Taking? Authorizing Provider  acetaminophen (TYLENOL) 500 MG tablet Take 500-1,000 mg by mouth every 6 (six) hours as needed (pain.).    [provider]  albuterol (VENTOLIN HFA) 108 (90 Base) MCG/ACT inhaler INHALE 2 PUFFS BY MOUTH EVERY 4 HOURS AS  NEEDED FOR WHEEZE OR FOR SHORTNESS OF BREATH Patient taking differently: Inhale 2 puffs into the lungs every 4 (four) hours as needed for wheezing or shortness of breath. 02/06/21   Cantwell, Celeste C, PA-C  amiodarone (PACERONE) 200 MG tablet TAKE 1 TABLET BY MOUTH EVERY DAY Patient taking differently: Take 200 mg by mouth in the morning. 09/29/20   Cantwell, Celeste C, PA-C  cetirizine (ZYRTEC) 10 MG tablet Take 10 mg by mouth daily. 06/23/20   [provider]  cinacalcet (SENSIPAR) 30 MG tablet TAKE 2 TABLETS (60 MG TOTAL) BY MOUTH EVERY MONDAY, WEDNESDAY, AND FRIDAY WITH HEMODIALYSIS. Patient taking differently: Take 60 mg by mouth 2 (two) times daily with a meal. Takes when he eats 09/12/20 09/12/21  Florencia Reasons, MD  diltiazem (CARDIZEM CD) 120 MG 24 hr capsule TAKE 1 CAPSULE BY MOUTH EVERY DAY Patient taking differently: Take 120 mg by mouth daily. 12/15/20   Adrian Prows, MD  gabapentin (NEURONTIN) 300 MG capsule TAKE 1 CAPSULE BY MOUTH IN THE MORNING AND AT BEDTIME Patient taking differently: Take 300 mg by mouth 2 (two) times daily. 11/14/20   Charlott Rakes, MD  lactulose (CHRONULAC) 10 GM/15ML solution Take 30 mLs (20 g total) by mouth daily. Patient taking differently: Take 20 g by mouth daily as needed (constipation). 09/12/20   Florencia Reasons, MD  lidocaine-prilocaine (EMLA) cream Apply 1 application topically every Monday, Wednesday, and Friday with hemodialysis. Applied sparingly for port access 11/01/20   [provider]  metoprolol succinate (TOPROL-XL) 25 MG 24 hr tablet Take 0.5 tablets (12.5 mg total) by mouth daily. Patient taking differently: Take 25 mg  by mouth daily. 01/29/21   Cantwell, Celeste C, PA-C  ondansetron (ZOFRAN-ODT) 4 MG disintegrating tablet Take 4 mg by mouth at bedtime as needed for nausea. 02/07/21   [provider]  oxyCODONE (ROXICODONE) 5 MG immediate release tablet 1 tab PO q6 hours prn pain Patient not taking: No sig reported 11/13/20   Leanora Cover,  MD  pantoprazole (PROTONIX) 40 MG tablet Take 40 mg by mouth daily.    [provider]  polyethylene glycol (MIRALAX / GLYCOLAX) 17 g packet Take 17 g by mouth daily as needed (constipation).    [provider]  sevelamer carbonate (RENVELA) 800 MG tablet Take 1,600-3,200 mg by mouth See admin instructions. Taking 4 tablets (3200 mg) three times daily with meals and 2 tabs (1600 mg) with snacks.    [provider]    Physical Exam: Vitals:   03/18/2021 1818 03/21/2021 1830 03/28/2021 1845 04/08/2021 1900  BP:  (!) 112/92 111/77 120/79  Pulse: (!) 105 (!) 105    Resp:  (!) 22 (!) 23 (!) 24  Temp:      TempSrc:      SpO2:  97%      Constitutional: NAD, somnolent  Eyes: PERTLA, periorbital edema ENMT: Mucous membranes are moist. Posterior pharynx clear of any exudate or lesions.   Neck: supple, no masses, swelling posteriorly Respiratory: Mild tachypnea. No wheezing. No accessory muscle use.  Cardiovascular: Rate ~120 and irregularly irregular. Elevated JVP. Abdomen: Distended, soft. Bowel sounds active.  Musculoskeletal: no clubbing / cyanosis. No joint deformity upper and lower extremities.   Skin: Ecchymoses. Bleeding abrasion to left 5th toe. Warm, dry, well-perfused. Neurologic:  Somnolent. Wakes to loud voice or painful stimuli, opens eyes briefly, but not speaking. Moving all extremities.     Labs and Imaging on Admission: I have personally reviewed following labs and imaging studies  CBC: Recent Labs  Lab 03/13/21 1245 03/13/21 1307 03/15/21 0500 03/15/21 0646 03/15/21 2126 03/16/21 0831 03/17/21 0550 04/03/2021 1702 03/27/2021 1711 04/03/2021 1726  WBC 8.7  --  10.8*  --  10.4  --  11.4* 13.2*  --   --   NEUTROABS  --   --   --   --  8.4*  --   --  11.1*  --   --   HGB 11.1*   < > 11.0*   < > 10.5* 10.9* 9.9* 11.3* 11.9* 11.9*  HCT 32.6*   < > 32.3*   < > 30.5* 32.0* 28.3* 34.1* 35.0* 35.0*  MCV 80.3  --  79.8*  --  78.4*  --  77.1* 81.2  --   --    PLT 217  --  231  --  204  --  201 240  --   --    < > = values in this interval not displayed.   Basic Metabolic Panel: Recent Labs  Lab 03/15/21 0500 03/15/21 0646 03/15/21 2126 03/16/21 0421 03/16/21 0831 03/17/21 0550 04/06/2021 1702 04/08/2021 1711 04/09/2021 1726  NA 132*   < > 135 134* 133* 135 134* 133* 134*  K 4.2   < > 4.3 4.7 4.7 3.9 4.9 4.7 5.0  CL 90*  --  95* 93*  --  97* 95* 100  --   CO2 26  --  29 26  --  26 24  --   --   GLUCOSE 109*  --  91 104*  --  167* 82 82  --   BUN 33*  --  20 22*  --  16 35* 36*  --   CREATININE 9.96*  --  6.68* 7.18*  --  4.70* 7.98* 8.60*  --   CALCIUM 8.3*  --  8.2* 8.6*  --  7.6* 8.5*  --   --    < > = values in this interval not displayed.   GFR: Estimated Creatinine Clearance: 8.4 mL/min (A) (by C-G formula based on SCr of 8.6 mg/dL (H)). Liver Function Tests: Recent Labs  Lab 03/13/21 1245 03/15/21 0500 03/17/21 0550 03/27/2021 1702  AST 29 25 28 31   ALT 14 15 13 22   ALKPHOS 187* 205* 165* 179*  BILITOT 0.8 0.8 0.6 1.4*  PROT 5.6* 6.1* 6.1* 6.4*  ALBUMIN 2.1* 2.3* 2.5* 2.8*   Recent Labs  Lab 03/24/2021 1702  LIPASE 31   Recent Labs  Lab 03/16/21 0839 03/12/2021 1702  AMMONIA 16 35   Coagulation Profile: No results for input(s): INR, PROTIME in the last 168 hours. Cardiac Enzymes: Recent Labs  Lab 03/15/21 0511 03/28/2021 1702  CKTOTAL 194 287   BNP (last 3 results) No results for input(s): PROBNP in the last 8760 hours. HbA1C: No results for input(s): HGBA1C in the last 72 hours. CBG: Recent Labs  Lab 03/27/2021 1703 03/22/2021 2218  GLUCAP 73 79   Lipid Profile: No results for input(s): CHOL, HDL, LDLCALC, TRIG, CHOLHDL, LDLDIRECT in the last 72 hours. Thyroid Function Tests: No results for input(s): TSH, T4TOTAL, FREET4, T3FREE, THYROIDAB in the last 72 hours. Anemia Panel: No results for input(s): VITAMINB12, FOLATE, FERRITIN, TIBC, IRON, RETICCTPCT in the last 72 hours. Urine analysis:    Component  Value Date/Time   COLORURINE YELLOW 07/16/2011 1619   APPEARANCEUR CLEAR 07/16/2011 1619   LABSPEC 1.018 07/16/2011 1619   PHURINE 7.5 07/16/2011 1619   GLUCOSEU 250 (A) 07/16/2011 1619   HGBUR MODERATE (A) 07/16/2011 1619   BILIRUBINUR SMALL (A) 07/16/2011 1619   KETONESUR NEGATIVE 07/16/2011 1619   PROTEINUR >300 (A) 07/16/2011 1619   UROBILINOGEN 1.0 07/16/2011 1619   NITRITE NEGATIVE 07/16/2011 1619   LEUKOCYTESUR SMALL (A) 07/16/2011 1619   Sepsis Labs: @LABRCNTIP (procalcitonin:4,lacticidven:4) ) Recent Results (from the past 240 hour(s))  Resp Panel by RT-PCR (Flu A&B, Covid) Nasopharyngeal Swab     Status: None   Collection Time: 03/13/21  1:10 PM   Specimen: Nasopharyngeal Swab; Nasopharyngeal(NP) swabs in vial transport medium  Result Value Ref Range Status   SARS Coronavirus 2 by RT PCR NEGATIVE NEGATIVE Final    Comment: (NOTE) SARS-CoV-2 target nucleic acids are NOT DETECTED.  The SARS-CoV-2 RNA is generally detectable in upper respiratory specimens during the acute phase of infection. The lowest concentration of SARS-CoV-2 viral copies this assay can detect is 138 copies/mL. A negative result does not preclude SARS-Cov-2 infection and should not be used as the sole basis for treatment or other patient management decisions. A negative result may occur with  improper specimen collection/handling, submission of specimen other than nasopharyngeal swab, presence of viral mutation(s) within the areas targeted by this assay, and inadequate number of viral copies(<138 copies/mL). A negative result must be combined with clinical observations, patient history, and epidemiological information. The expected result is Negative.  Fact Sheet for Patients:  EntrepreneurPulse.com.au  Fact Sheet for Healthcare Providers:  IncredibleEmployment.be  This test is no t yet approved or cleared by the [place] FDA and  has been authorized for  detection and/or diagnosis of SARS-CoV-2 by FDA under an Emergency Use Authorization (EUA). This EUA will  remain  in effect (meaning this test can be used) for the duration of the COVID-19 declaration under Section 564(b)(1) of the Act, 21 U.S.C.section 360bbb-3(b)(1), unless the authorization is terminated  or revoked sooner.       Influenza A by PCR NEGATIVE NEGATIVE Final   Influenza B by PCR NEGATIVE NEGATIVE Final    Comment: (NOTE) The Xpert Xpress SARS-CoV-2/FLU/RSV plus assay is intended as an aid in the diagnosis of influenza from Nasopharyngeal swab specimens and should not be used as a sole basis for treatment. Nasal washings and aspirates are unacceptable for Xpert Xpress SARS-CoV-2/FLU/RSV testing.  Fact Sheet for Patients: EntrepreneurPulse.com.au  Fact Sheet for Healthcare Providers: IncredibleEmployment.be  This test is not yet approved or cleared by the [place] FDA and has been authorized for detection and/or diagnosis of SARS-CoV-2 by FDA under an Emergency Use Authorization (EUA). This EUA will remain in effect (meaning this test can be used) for the duration of the COVID-19 declaration under Section 564(b)(1) of the Act, 21 U.S.C. section 360bbb-3(b)(1), unless the authorization is terminated or revoked.  Performed at [facility], [place] [address], [place], [place] [address]   Resp Panel by RT-PCR (Flu A&B, Covid) Nasopharyngeal Swab     Status: None   Collection Time: 03/15/21 10:03 AM   Specimen: Nasopharyngeal Swab; Nasopharyngeal(NP) swabs in vial transport medium  Result Value Ref Range Status   SARS Coronavirus 2 by RT PCR NEGATIVE NEGATIVE Final    Comment: (NOTE) SARS-CoV-2 target nucleic acids are NOT DETECTED.  The SARS-CoV-2 RNA is generally detectable in upper respiratory specimens during the acute phase of infection. The lowest concentration of SARS-CoV-2 viral copies this assay can detect  is 138 copies/mL. A negative result does not preclude SARS-Cov-2 infection and should not be used as the sole basis for treatment or other patient management decisions. A negative result may occur with  improper specimen collection/handling, submission of specimen other than nasopharyngeal swab, presence of viral mutation(s) within the areas targeted by this assay, and inadequate number of viral copies(<138 copies/mL). A negative result must be combined with clinical observations, patient history, and epidemiological information. The expected result is Negative.  Fact Sheet for Patients:  EntrepreneurPulse.com.au  Fact Sheet for Healthcare Providers:  IncredibleEmployment.be  This test is no t yet approved or cleared by the [place] FDA and  has been authorized for detection and/or diagnosis of SARS-CoV-2 by FDA under an Emergency Use Authorization (EUA). This EUA will remain  in effect (meaning this test can be used) for the duration of the COVID-19 declaration under Section 564(b)(1) of the Act, 21 U.S.C.section 360bbb-3(b)(1), unless the authorization is terminated  or revoked sooner.       Influenza A by PCR NEGATIVE NEGATIVE Final   Influenza B by PCR NEGATIVE NEGATIVE Final    Comment: (NOTE) The Xpert Xpress SARS-CoV-2/FLU/RSV plus assay is intended as an aid in the diagnosis of influenza from Nasopharyngeal swab specimens and should not be used as a sole basis for treatment. Nasal washings and aspirates are unacceptable for Xpert Xpress SARS-CoV-2/FLU/RSV testing.  Fact Sheet for Patients: EntrepreneurPulse.com.au  Fact Sheet for Healthcare Providers: IncredibleEmployment.be  This test is not yet approved or cleared by the [place] FDA and has been authorized for detection and/or diagnosis of SARS-CoV-2 by FDA under an Emergency Use Authorization (EUA). This EUA will remain in effect  (meaning this test can be used) for the duration of the COVID-19 declaration under Section 564(b)(1) of the Act,  21 U.S.C. section 360bbb-3(b)(1), unless the authorization is terminated or revoked.  Performed at [facility], [place] [address], [place], [place] [address]   Resp Panel by RT-PCR (Flu A&B, Covid) Nasopharyngeal Swab     Status: Abnormal   Collection Time: 03/18/2021  5:01 PM   Specimen: Nasopharyngeal Swab; Nasopharyngeal(NP) swabs in vial transport medium  Result Value Ref Range Status   SARS Coronavirus 2 by RT PCR POSITIVE (A) NEGATIVE Final    Comment: RESULT CALLED TO, READ BACK BY AND VERIFIED WITH: RYAN HARDY 03/29/2021 @1858  BY JW (NOTE) SARS-CoV-2 target nucleic acids are DETECTED.  The SARS-CoV-2 RNA is generally detectable in upper respiratory specimens during the acute phase of infection. Positive results are indicative of the presence of the identified virus, but do not rule out bacterial infection or co-infection with other pathogens not detected by the test. Clinical correlation with patient history and other diagnostic information is necessary to determine patient infection status. The expected result is Negative.  Fact Sheet for Patients: EntrepreneurPulse.com.au  Fact Sheet for Healthcare Providers: IncredibleEmployment.be  This test is not yet approved or cleared by the [place] FDA and  has been authorized for detection and/or diagnosis of SARS-CoV-2 by FDA under an Emergency Use Authorization (EUA).  This EUA will remain in effect (meaning this test can be use d) for the duration of  the COVID-19 declaration under Section 564(b)(1) of the Act, 21 U.S.C. section 360bbb-3(b)(1), unless the authorization is terminated or revoked sooner.     Influenza A by PCR NEGATIVE NEGATIVE Final   Influenza B by PCR NEGATIVE NEGATIVE Final    Comment: (NOTE) The Xpert Xpress SARS-CoV-2/FLU/RSV plus assay is intended  as an aid in the diagnosis of influenza from Nasopharyngeal swab specimens and should not be used as a sole basis for treatment. Nasal washings and aspirates are unacceptable for Xpert Xpress SARS-CoV-2/FLU/RSV testing.  Fact Sheet for Patients: EntrepreneurPulse.com.au  Fact Sheet for Healthcare Providers: IncredibleEmployment.be  This test is not yet approved or cleared by the [place] FDA and has been authorized for detection and/or diagnosis of SARS-CoV-2 by FDA under an Emergency Use Authorization (EUA). This EUA will remain in effect (meaning this test can be used) for the duration of the COVID-19 declaration under Section 564(b)(1) of the Act, 21 U.S.C. section 360bbb-3(b)(1), unless the authorization is terminated or revoked.  Performed at [facility], [place] [address], [place], [place] [address]      Radiological Exams on Admission: CT ABDOMEN PELVIS WO CONTRAST  Result Date: 04/10/2021 CLINICAL DATA:  Fall, abdominal trauma, found on floor EXAM: CT CHEST, ABDOMEN AND PELVIS WITHOUT CONTRAST TECHNIQUE: Multidetector CT imaging of the chest, abdomen and pelvis was performed following the standard protocol without IV contrast. COMPARISON:  None. FINDINGS: CT CHEST FINDINGS Cardiovascular: Cardiomegaly. Diffuse coronary artery and aortic calcifications. Small pericardial effusion. Mediastinum/Nodes: Mild right paratracheal lymph nodes measuring approximately 1.2 cm in short axis diameter, stable since prior study. No visible axillary or hilar adenopathy. Lungs/Pleura: Compressive atelectasis in the lower lobes. Platelike opacities in both mid and lower lungs compatible with atelectasis.Small bilateral effusions, stable since prior study. Musculoskeletal: No acute bony abnormality. CT ABDOMEN PELVIS FINDINGS Hepatobiliary: No focal hepatic abnormality. Gallbladder unremarkable. Pancreas: No focal abnormality or ductal dilatation. Spleen: No  focal abnormality.  Normal size. Adrenals/Urinary Tract: Atrophic kidneys with severe renovascular calcifications. No hydronephrosis. No perinephric hematoma or adrenal hemorrhage. Urinary bladder decompressed. Stomach/Bowel: Stomach, large and small bowel grossly unremarkable. Vascular/Lymphatic: Heavily  calcified aorta, iliac vessels and branch vessels. No evidence of aneurysm or adenopathy. Reproductive: No visible focal abnormality. Other: Large volume ascites throughout the abdomen and pelvis. Musculoskeletal: No acute bony abnormality. IMPRESSION: No evidence of traumatic injury to the chest, abdomen or pelvis. Small bilateral pleural effusions. Cardiomegaly. Mild mediastinal adenopathy, stable since prior study. Large volume ascites in the abdomen and pelvis. Coronary artery disease.  Diffuse arterial calcifications. Electronically Signed   By: Rolm Baptise M.D.   On: 03/27/2021 21:05   CT HEAD WO CONTRAST (5MM)  Result Date: 03/17/2021 CLINICAL DATA:  Altered mental status EXAM: CT HEAD WITHOUT CONTRAST CT MAXILLOFACIAL WITHOUT CONTRAST CT CERVICAL SPINE WITHOUT CONTRAST TECHNIQUE: Multidetector CT imaging of the head, cervical spine, and maxillofacial structures were performed using the standard protocol without intravenous contrast. Multiplanar CT image reconstructions of the cervical spine and maxillofacial structures were also generated. COMPARISON:  CT brain and cervical spine 03/13/2021 FINDINGS: CT HEAD FINDINGS Brain: No acute territorial infarction, hemorrhage, or intracranial mass. Mild atrophy and chronic small vessel ischemic changes of the white matter. Stable ventricle size. Small chronic left cerebellar infarcts. Small chronic right posterior temporal infarct. Chronic lacunar infarcts within the thalamus and basal ganglia. Vascular: No hyperdense vessels. Vertebral and carotid vascular calcification Skull: None Other: Prominent scalp soft tissue swelling anteriorly, over the right parietal  region and the bilateral occiput. CT MAXILLOFACIAL FINDINGS Osseous: Motion degradation limits assessment. Mastoid air cells grossly clear. Mandibular heads are normally position. No mandibular fracture. Pterygoid plates and zygomatic arches appear intact. No acute nasal fracture Orbits: Negative. No traumatic or inflammatory finding. Sinuses: Fluid levels in the maxillary sinuses without definitive sinus wall fracture Soft tissues: Extensive right greater than left facial edema with edema at the base of the right neck. Marked periorbital and forehead soft tissue swelling. CT CERVICAL SPINE FINDINGS Alignment: Considerable motion degradation which limits assessment for fracture. Straightening cervical spine. Facet alignment grossly maintained Skull base and vertebrae: No gross fracture seen Soft tissues and spinal canal: No prevertebral fluid or swelling. No visible canal hematoma. Disc levels:  Disc space narrowing C3-C4. Upper chest: Lung apices clear Other: Marked edema within the subcutaneous soft tissues of the right sub occipital region and right greater than left neck. IMPRESSION: 1. Limited by motion degradation 2. No definite CT evidence for acute intracranial abnormality. Mild atrophy and chronic small vessel ischemic change of the white matter. Multiple small chronic infarcts 3. No definite acute facial bone fracture identified. Bilateral maxillary sinus fluid levels without definitive fracture 4. Considerable motion degradation of the cervical spine. No gross fracture seen 5. Extensive edema within the scalp soft tissues and right greater than left neck. Electronically Signed   By: Donavan Foil M.D.   On: 03/13/2021 18:55   CT Chest Wo Contrast  Result Date: 04/09/2021 CLINICAL DATA:  Fall, abdominal trauma, found on floor EXAM: CT CHEST, ABDOMEN AND PELVIS WITHOUT CONTRAST TECHNIQUE: Multidetector CT imaging of the chest, abdomen and pelvis was performed following the standard protocol without IV  contrast. COMPARISON:  None. FINDINGS: CT CHEST FINDINGS Cardiovascular: Cardiomegaly. Diffuse coronary artery and aortic calcifications. Small pericardial effusion. Mediastinum/Nodes: Mild right paratracheal lymph nodes measuring approximately 1.2 cm in short axis diameter, stable since prior study. No visible axillary or hilar adenopathy. Lungs/Pleura: Compressive atelectasis in the lower lobes. Platelike opacities in both mid and lower lungs compatible with atelectasis.Small bilateral effusions, stable since prior study. Musculoskeletal: No acute bony abnormality. CT ABDOMEN PELVIS FINDINGS Hepatobiliary: No focal hepatic abnormality. Gallbladder  unremarkable. Pancreas: No focal abnormality or ductal dilatation. Spleen: No focal abnormality.  Normal size. Adrenals/Urinary Tract: Atrophic kidneys with severe renovascular calcifications. No hydronephrosis. No perinephric hematoma or adrenal hemorrhage. Urinary bladder decompressed. Stomach/Bowel: Stomach, large and small bowel grossly unremarkable. Vascular/Lymphatic: Heavily calcified aorta, iliac vessels and branch vessels. No evidence of aneurysm or adenopathy. Reproductive: No visible focal abnormality. Other: Large volume ascites throughout the abdomen and pelvis. Musculoskeletal: No acute bony abnormality. IMPRESSION: No evidence of traumatic injury to the chest, abdomen or pelvis. Small bilateral pleural effusions. Cardiomegaly. Mild mediastinal adenopathy, stable since prior study. Large volume ascites in the abdomen and pelvis. Coronary artery disease.  Diffuse arterial calcifications. Electronically Signed   By: Rolm Baptise M.D.   On: 03/17/2021 21:05   CT Cervical Spine Wo Contrast  Result Date: 03/28/2021 CLINICAL DATA:  Altered mental status EXAM: CT HEAD WITHOUT CONTRAST CT MAXILLOFACIAL WITHOUT CONTRAST CT CERVICAL SPINE WITHOUT CONTRAST TECHNIQUE: Multidetector CT imaging of the head, cervical spine, and maxillofacial structures were performed  using the standard protocol without intravenous contrast. Multiplanar CT image reconstructions of the cervical spine and maxillofacial structures were also generated. COMPARISON:  CT brain and cervical spine 03/13/2021 FINDINGS: CT HEAD FINDINGS Brain: No acute territorial infarction, hemorrhage, or intracranial mass. Mild atrophy and chronic small vessel ischemic changes of the white matter. Stable ventricle size. Small chronic left cerebellar infarcts. Small chronic right posterior temporal infarct. Chronic lacunar infarcts within the thalamus and basal ganglia. Vascular: No hyperdense vessels. Vertebral and carotid vascular calcification Skull: None Other: Prominent scalp soft tissue swelling anteriorly, over the right parietal region and the bilateral occiput. CT MAXILLOFACIAL FINDINGS Osseous: Motion degradation limits assessment. Mastoid air cells grossly clear. Mandibular heads are normally position. No mandibular fracture. Pterygoid plates and zygomatic arches appear intact. No acute nasal fracture Orbits: Negative. No traumatic or inflammatory finding. Sinuses: Fluid levels in the maxillary sinuses without definitive sinus wall fracture Soft tissues: Extensive right greater than left facial edema with edema at the base of the right neck. Marked periorbital and forehead soft tissue swelling. CT CERVICAL SPINE FINDINGS Alignment: Considerable motion degradation which limits assessment for fracture. Straightening cervical spine. Facet alignment grossly maintained Skull base and vertebrae: No gross fracture seen Soft tissues and spinal canal: No prevertebral fluid or swelling. No visible canal hematoma. Disc levels:  Disc space narrowing C3-C4. Upper chest: Lung apices clear Other: Marked edema within the subcutaneous soft tissues of the right sub occipital region and right greater than left neck. IMPRESSION: 1. Limited by motion degradation 2. No definite CT evidence for acute intracranial abnormality. Mild  atrophy and chronic small vessel ischemic change of the white matter. Multiple small chronic infarcts 3. No definite acute facial bone fracture identified. Bilateral maxillary sinus fluid levels without definitive fracture 4. Considerable motion degradation of the cervical spine. No gross fracture seen 5. Extensive edema within the scalp soft tissues and right greater than left neck. Electronically Signed   By: Donavan Foil M.D.   On: 03/16/2021 18:55   DG Pelvis Portable  Result Date: 03/16/2021 CLINICAL DATA:  Fall.  Unresponsive. EXAM: PORTABLE PELVIS 1-2 VIEWS COMPARISON:  None. FINDINGS: The cortical margins of the bony pelvis are intact. No fracture. Pubic symphysis and sacroiliac joints are congruent. Both femoral heads are well-seated in the respective acetabula. The bones are diffusely under mineralized. Advanced vascular calcifications, particularly for age. IMPRESSION: 1. No pelvic fracture. 2. Advanced vascular calcifications. Electronically Signed   By: Aurther Loft.D.  On: 04/06/2021 17:52   DG Chest Port 1 View  Result Date: 04/03/2021 CLINICAL DATA:  Fall, unresponsive. EXAM: PORTABLE CHEST 1 VIEW COMPARISON:  Chest x-ray 03/15/2021. FINDINGS: Cardiac silhouette is markedly enlarged, unchanged. There is central pulmonary vascular congestion. Multifocal airspace opacities in the mid and lower lungs bilaterally. Additionally, there is a band of airspace opacity in the right mid lung. There is no pleural effusion or pneumothorax identified. There are atherosclerotic calcifications of the aorta and subclavian arteries. No acute fractures are seen. IMPRESSION: 1. Cardiomegaly with central pulmonary vascular congestion. 2. Patchy bilateral multifocal airspace opacities worrisome for infection, right greater than left. Electronically Signed   By: Ronney Asters M.D.   On: 04/07/2021 17:51   CT Maxillofacial Wo Contrast  Result Date: 03/15/2021 CLINICAL DATA:  Altered mental status EXAM: CT  HEAD WITHOUT CONTRAST CT MAXILLOFACIAL WITHOUT CONTRAST CT CERVICAL SPINE WITHOUT CONTRAST TECHNIQUE: Multidetector CT imaging of the head, cervical spine, and maxillofacial structures were performed using the standard protocol without intravenous contrast. Multiplanar CT image reconstructions of the cervical spine and maxillofacial structures were also generated. COMPARISON:  CT brain and cervical spine 03/13/2021 FINDINGS: CT HEAD FINDINGS Brain: No acute territorial infarction, hemorrhage, or intracranial mass. Mild atrophy and chronic small vessel ischemic changes of the white matter. Stable ventricle size. Small chronic left cerebellar infarcts. Small chronic right posterior temporal infarct. Chronic lacunar infarcts within the thalamus and basal ganglia. Vascular: No hyperdense vessels. Vertebral and carotid vascular calcification Skull: None Other: Prominent scalp soft tissue swelling anteriorly, over the right parietal region and the bilateral occiput. CT MAXILLOFACIAL FINDINGS Osseous: Motion degradation limits assessment. Mastoid air cells grossly clear. Mandibular heads are normally position. No mandibular fracture. Pterygoid plates and zygomatic arches appear intact. No acute nasal fracture Orbits: Negative. No traumatic or inflammatory finding. Sinuses: Fluid levels in the maxillary sinuses without definitive sinus wall fracture Soft tissues: Extensive right greater than left facial edema with edema at the base of the right neck. Marked periorbital and forehead soft tissue swelling. CT CERVICAL SPINE FINDINGS Alignment: Considerable motion degradation which limits assessment for fracture. Straightening cervical spine. Facet alignment grossly maintained Skull base and vertebrae: No gross fracture seen Soft tissues and spinal canal: No prevertebral fluid or swelling. No visible canal hematoma. Disc levels:  Disc space narrowing C3-C4. Upper chest: Lung apices clear Other: Marked edema within the  subcutaneous soft tissues of the right sub occipital region and right greater than left neck. IMPRESSION: 1. Limited by motion degradation 2. No definite CT evidence for acute intracranial abnormality. Mild atrophy and chronic small vessel ischemic change of the white matter. Multiple small chronic infarcts 3. No definite acute facial bone fracture identified. Bilateral maxillary sinus fluid levels without definitive fracture 4. Considerable motion degradation of the cervical spine. No gross fracture seen 5. Extensive edema within the scalp soft tissues and right greater than left neck. Electronically Signed   By: Donavan Foil M.D.   On: 03/23/2021 18:55    EKG: Independently reviewed. Atrial fibrillation with RVR, rate 130, QTc 533 ms.   Assessment/Plan   1. Acute encephalopathy  - Presents with somnolence after being found on floor by home health after [facility] saw him normal yesterday  - No acute findings on limited head CT and ammonia normal in ED  - Discussed with his sister/POA who suspects this is related to his abuse of oxycodone noting that he always runs out of his Rx early and obtains more illicitly  - He is  easily roused, protecting his airway, and blood gas was okay so will hold off on Narcan for now but use if worsens, expand workup if he does not continue to improve as expected    2. Acute hypoxic respiratory failure; hypervolemia; ?pneumonia   - Presents with somnolence and hypoxia after being found down at home  - He is hypervolemic on admission, there was question of pneumonia and CXR and empiric antibiotics were started in ED  - Appreciate nephrology, planning for HD tonight  - No conspicuous pneumonia on CT chest, plan to hold further antibiotics for now while checking procalcitonin, monitoring response to HD, and following clinical course    3. Acute COVID-19 infection  - COVID-19 pcr positive in ED, was negative on 03/15/21  - He is hypoxic and was started on remdesivir, steroids,  and Actemra in ED though no COVID pneumonia type findings on CXR  - Trend markers, continue remdesivir and steroids for now    4. ESRD  - Last HD was in hospital overnight on 9/5-9/6  - Appreciate nephrology consultants, planning for HD tonight  - Renally-dose medications, restrict fluids   5. Atrial fibrillation with RVR  - HR 130 in ED  - Not anticoagulated due to bleeding risk and poor compliance  - On amiodarone at home, QT prolonged on admission, plan to use Lopressor IVPs as needed for now, repeat EKG in am prior to resuming amiodarone    6. Cirrhosis with ascites  - Large vol ascites on CT, abd distended but soft  - Would likely benefit from therapeutic paracentesis this admission   7. COPD  - No wheezing on admission  - Continue inhalers   8. Chronic pain  - Hold sedating medications   9. CAD  - Mild elevation in troponin in setting of acute respiratory failure and rapid AF  - Trend troponin, treat underlying respiratory failure and rapid AF   10. Prolonged QT interval  - QTc 533 ms on admission  - Minimize QT-prolonging medications, check magnesium level, repeat EKG in am    DVT prophylaxis: sq heparin  Code Status: DNR   Level of Care: Level of care: Progressive Family Communication: Sister (POA) updated by phone  Disposition Plan:  Patient is from: Home  Anticipated d/c is to: TBD Anticipated d/c date is: 03/22/21  Patient currently: Pending HD, improvement in respiratory and mental status  Consults called: Nephrology  Admission status: Inpatient     Vianne Bulls, MD [facility]  03/18/2021, 10:27 PM

## 2021-03-19 NOTE — Consult Note (Signed)
Reason for Consult: To manage dialysis and dialysis related needs Referring Physician: Jadarion Halbig is an 60 y.o. male well known to [place] ESRD-  HD MWF at [facility]-  non compliant with HD, CAD, cirrhosis with need for freq paracentesis, COPD, chronic hep B and substance abuse.  He has many hospitalizations in his recent past - usually for volume overload and missed HD.  Here from 8/24-8/27.  Had outpatient HD on 8/29 and 8/31-  then returned here on 9/2 ( no HD ) then returned on 9/4-   had HD here overnight on 9/5 into 9/6-  then left AMA on 9/6- did not go to OP HD on 9/7.  Returns here today after being found altered at home-  found to be hypoxic-  CXR looks like PNA-  is covid positive.  We are informed of his pending admission.  I talked to the HD nurse who is in house and she indicates due to his covid positivity he likely would not be able to get HD til late tomorrow so we made decision to run him tonight-  attempting max volume removal    Dialysis Orders:  [facility]-MWF  4hrs, BFR 450, DFR Auto1.5,  EDW 69kg, 2K/ 2Ca- no heparin   Access: R AVG  Calcitriol 1.64mcg PO qHD-last dose 03/11/21 Sensipar 30mg  3x/weekly-last dose 03/09/21  Past Medical History:  Diagnosis Date   Anemia    Anxiety    Arthritis    left shoulder   Arthritis    Atherosclerosis of aorta (HCC)    Cardiomegaly    Chest pain    DATE UNKNOWN, C/O PERIODICALLY   Chest pain    Cocaine abuse ([place])    COPD (chronic obstructive pulmonary disease) ([place])    COPD exacerbation ([place]) 08/17/2016   Coronary artery disease    stent 02/22/17   Coronary artery disease    Dysrhythmia    ESRD (end stage renal disease) ([place])    ESRD (end stage renal disease) on dialysis ([place])    "[name]; M-W-F" (07/04/2017)   GERD (gastroesophageal reflux disease)    DATE UNKNOWN   GERD (gastroesophageal reflux disease)    Hemorrhoids    Hepatitis B    Hepatitis B, chronic (HCC)    Hepatitis C    History of kidney stones     Hyperkalemia    Hypertension    Kidney failure    Metabolic bone disease    Patient denies   Mitral stenosis    Myocardial infarction [facility])    Pneumonia    Pulmonary edema    Renal disorder    Solitary rectal ulcer syndrome 07/2017   at flex sig for rectal bleeding   Solitary rectal ulcer syndrome    Tubular adenoma of colon     Past Surgical History:  Procedure Laterality Date   A/V FISTULAGRAM Left 05/26/2017   Procedure: A/V FISTULAGRAM;  Surgeon: Conrad Amherst, MD;  Location: [place] [facility];  Service: Cardiovascular;  Laterality: Left;   A/V FISTULAGRAM Right 11/18/2017   Procedure: A/V FISTULAGRAM - Right Arm;  Surgeon: Elam Dutch, MD;  Location: [place] [facility];  Service: Cardiovascular;  Laterality: Right;   AMPUTATION Right 09/04/2020   Procedure: RIGHT INDEX AND RIGHT RING FINGER AMPUTATION DIGIT;  Surgeon: Leanora Cover, MD;  Location: [place];  Service: Orthopedics;  Laterality: Right;   AMPUTATION Right 11/13/2020   Procedure: RIGHT INDEX FINGER AMPUTATION AND RIGHT RING FINGER AMPUTATION;  Surgeon: Leanora Cover,  MD;  Location: [place];  Service: Orthopedics;  Laterality: Right;   APPLICATION OF WOUND VAC Left 06/14/2017   Procedure: APPLICATION OF WOUND VAC;  Surgeon: Katha Cabal, MD;  Location: [facility];  Service: Vascular;  Laterality: Left;   AV FISTULA PLACEMENT  2012   BELIEVED WAS PLACED IN JUNE   AV FISTULA PLACEMENT Right 08/09/2017   Procedure: Creation Right arm ARTERIOVENOUS BRACHIOCEPOHALIC FISTULA;  Surgeon: Elam Dutch, MD;  [facility];  Service: Vascular;  Laterality: Right;   AV FISTULA PLACEMENT Right 11/22/2017   Procedure: INSERTION OF ARTERIOVENOUS (AV) GORE-TEX GRAFT RIGHT UPPER ARM;  Surgeon: Elam Dutch, MD;  Location: [place];  Service: Vascular;  Laterality: Right;   BIOPSY  01/25/2018   Procedure: BIOPSY;  Surgeon: Jerene Bears, MD;  Location: [place];  Service: Gastroenterology;;   BIOPSY  04/10/2019   Procedure:  BIOPSY;  Surgeon: Jerene Bears, MD;  Location: [facility];  Service: Gastroenterology;;   COLONOSCOPY     COLONOSCOPY WITH PROPOFOL N/A 01/25/2018   Procedure: COLONOSCOPY WITH PROPOFOL;  Surgeon: Jerene Bears, MD;  Location: [place];  Service: Gastroenterology;  Laterality: N/A;   CORONARY STENT INTERVENTION N/A 02/22/2017   Procedure: CORONARY STENT INTERVENTION;  Surgeon: Nigel Mormon, MD;  Location: [place] [facility];  Service: Cardiovascular;  Laterality: N/A;   ESOPHAGOGASTRODUODENOSCOPY (EGD) WITH PROPOFOL N/A 01/25/2018   Procedure: ESOPHAGOGASTRODUODENOSCOPY (EGD) WITH PROPOFOL;  Surgeon: Jerene Bears, MD;  Location: [place];  Service: Gastroenterology;  Laterality: N/A;   ESOPHAGOGASTRODUODENOSCOPY (EGD) WITH PROPOFOL N/A 04/10/2019   Procedure: ESOPHAGOGASTRODUODENOSCOPY (EGD) WITH PROPOFOL;  Surgeon: Jerene Bears, MD;  [facility];  Service: Gastroenterology;  Laterality: N/A;   FLEXIBLE SIGMOIDOSCOPY N/A 07/15/2017   Procedure: FLEXIBLE SIGMOIDOSCOPY;  Surgeon: Carol Ada, MD;  Location: [place];  Service: Endoscopy;  Laterality: N/A;   HEMORRHOID BANDING     I & D EXTREMITY Left 06/01/2017   Procedure: IRRIGATION AND DEBRIDEMENT LEFT ARM HEMATOMA WITH LIGATION OF LEFT ARM AV FISTULA;  Surgeon: Elam Dutch, MD;  Location: [place];  Service: Vascular;  Laterality: Left;   I & D EXTREMITY Left 06/14/2017   Procedure: IRRIGATION AND DEBRIDEMENT EXTREMITY;  Surgeon: Katha Cabal, MD;  Location: [facility];  Service: Vascular;  Laterality: Left;   INSERTION OF DIALYSIS CATHETER  05/30/2017   INSERTION OF DIALYSIS CATHETER N/A 05/30/2017   Procedure: INSERTION OF DIALYSIS CATHETER;  Surgeon: Elam Dutch, MD;  Location: [place];  Service: Vascular;  Laterality: N/A;   IR PARACENTESIS  08/30/2017   IR PARACENTESIS  09/29/2017   IR PARACENTESIS  10/28/2017   IR PARACENTESIS  11/09/2017   IR PARACENTESIS  11/16/2017   IR PARACENTESIS  11/28/2017    IR PARACENTESIS  12/01/2017   IR PARACENTESIS  12/06/2017   IR PARACENTESIS  01/03/2018   IR PARACENTESIS  01/23/2018   IR PARACENTESIS  02/07/2018   IR PARACENTESIS  02/21/2018   IR PARACENTESIS  03/06/2018   IR PARACENTESIS  03/17/2018   IR PARACENTESIS  04/04/2018   IR PARACENTESIS  12/28/2018   IR PARACENTESIS  01/08/2019   IR PARACENTESIS  01/23/2019   IR PARACENTESIS  02/01/2019   IR PARACENTESIS  02/19/2019   IR PARACENTESIS  03/01/2019   IR PARACENTESIS  03/15/2019   IR PARACENTESIS  04/03/2019   IR PARACENTESIS  04/12/2019   IR PARACENTESIS  05/01/2019   IR PARACENTESIS  05/08/2019   IR PARACENTESIS  05/24/2019  IR PARACENTESIS  06/12/2019   IR PARACENTESIS  07/09/2019   IR PARACENTESIS  07/27/2019   IR PARACENTESIS  08/09/2019   IR PARACENTESIS  08/21/2019   IR PARACENTESIS  09/17/2019   IR PARACENTESIS  10/05/2019   IR PARACENTESIS  10/29/2019   IR PARACENTESIS  11/08/2019   IR PARACENTESIS  12/12/2019   IR PARACENTESIS  01/03/2020   IR PARACENTESIS  01/10/2020   IR PARACENTESIS  01/17/2020   IR PARACENTESIS  01/24/2020   IR PARACENTESIS  01/31/2020   IR PARACENTESIS  02/07/2020   IR PARACENTESIS  02/21/2020   IR PARACENTESIS  05/21/2020   IR PARACENTESIS  10/10/2020   IR PARACENTESIS  10/28/2020   IR PARACENTESIS  11/18/2020   IR PARACENTESIS  12/02/2020   IR PARACENTESIS  12/11/2020   IR PARACENTESIS  12/18/2020   IR PARACENTESIS  12/30/2020   IR PARACENTESIS  01/06/2021   IR PARACENTESIS  01/13/2021   IR PARACENTESIS  01/20/2021   IR PARACENTESIS  01/27/2021   IR PARACENTESIS  02/03/2021   IR PARACENTESIS  02/10/2021   IR PARACENTESIS  02/17/2021   IR PARACENTESIS  02/24/2021   IR PARACENTESIS  03/10/2021   IR RADIOLOGIST EVAL & MGMT  02/14/2018   IR RADIOLOGIST EVAL & MGMT  02/22/2019   LEFT HEART CATH AND CORONARY ANGIOGRAPHY N/A 02/22/2017   Procedure: LEFT HEART CATH AND CORONARY ANGIOGRAPHY;  Surgeon: Nigel Mormon, MD;  Location: [place] [facility];  Service: Cardiovascular;  Laterality: N/A;    LIGATION OF ARTERIOVENOUS  FISTULA Left 08/19/3149   Procedure: Plication of Left Arm Arteriovenous Fistula;  Surgeon: Elam Dutch, MD;  Location: [facility];  Service: Vascular;  Laterality: Left;   POLYPECTOMY     POLYPECTOMY  01/25/2018   Procedure: POLYPECTOMY;  Surgeon: Jerene Bears, MD;  Location: [place];  Service: Gastroenterology;;   REVISON OF ARTERIOVENOUS FISTULA Left 7/61/6073   Procedure: PLICATION OF DISTAL ANEURYSMAL SEGEMENT OF LEFT UPPER ARM ARTERIOVENOUS FISTULA;  Surgeon: Elam Dutch, MD;  Location: [place];  Service: Vascular;  Laterality: Left;   REVISON OF ARTERIOVENOUS FISTULA Left 01/19/6268   Procedure: Plication of Left Upper Arm Fistula ;  Surgeon: Waynetta Sandy, MD;  Location: [place];  Service: Vascular;  Laterality: Left;   SKIN GRAFT SPLIT THICKNESS LEG / FOOT Left    SKIN GRAFT SPLIT THICKNESS LEFT ARM DONOR SITE: LEFT ANTERIOR THIGH   SKIN SPLIT GRAFT Left 07/04/2017   Procedure: SKIN GRAFT SPLIT THICKNESS LEFT ARM DONOR SITE: LEFT ANTERIOR THIGH;  Surgeon: Elam Dutch, MD;  Location: [place];  Service: Vascular;  Laterality: Left;   THROMBECTOMY W/ EMBOLECTOMY Left 06/05/2017   Procedure: EXPLORATION OF LEFT ARM FOR BLEEDING; OVERSEWED PROXIMAL FISTULA;  Surgeon: Angelia Mould, MD;  Location: [facility] OR;  Service: Vascular;  Laterality: Left;   WOUND EXPLORATION Left 06/03/2017   Procedure: WOUND EXPLORATION WITH WOUND VAC APPLICATION TO LEFT ARM;  Surgeon: Angelia Mould, MD;  Location: [facility] OR;  Service: Vascular;  Laterality: Left;    Family History  Problem Relation Age of Onset   Heart disease Mother    Lung cancer Mother    Heart disease Father    Malignant hyperthermia Father    COPD Father    Throat cancer Sister    Esophageal cancer Sister    Hypertension Other    COPD Other    Throat cancer Sister    Colon cancer Neg Hx    Colon polyps  Neg Hx    Rectal cancer Neg Hx    Stomach cancer Neg Hx     Social  History:  reports that he has been smoking cigarettes. He started smoking about 47 years ago. He has a 21.50 pack-year smoking history. He has never used smokeless tobacco. He reports that he does not currently use alcohol. He reports current drug use. Drugs: Marijuana and Cocaine.  Allergies:  Allergies  Allergen Reactions   Tramadol Itching and Other (See Comments)    Other reaction(s): Unknown   Grass Extracts [Gramineae Pollens]     Other reaction(s): Sneezing   Morphine And Related Other (See Comments)    Stomach pain   Pollen Extract Other (See Comments)    Other reaction(s): Sneezing (finding)   Acetaminophen Nausea Only    Stomach ache    Aspirin Itching and Other (See Comments)    STOMACH PAIN  Other reaction(s): Unknown   Clonidine Derivatives Itching    Medications: I have reviewed the patient's current medications.  Results for orders placed or performed during the hospital encounter of 03/15/2021 (from the past 48 hour(s))  Resp Panel by RT-PCR (Flu A&B, Covid) Nasopharyngeal Swab     Status: Abnormal   Collection Time: 03/18/2021  5:01 PM   Specimen: Nasopharyngeal Swab; Nasopharyngeal(NP) swabs in vial transport medium  Result Value Ref Range   SARS Coronavirus 2 by RT PCR POSITIVE (A) NEGATIVE    Comment: RESULT CALLED TO, READ BACK BY AND VERIFIED WITH: RYAN HARDY 03/22/2021 @1858  BY JW (NOTE) SARS-CoV-2 target nucleic acids are DETECTED.  The SARS-CoV-2 RNA is generally detectable in upper respiratory specimens during the acute phase of infection. Positive results are indicative of the presence of the identified virus, but do not rule out bacterial infection or co-infection with other pathogens not detected by the test. Clinical correlation with patient history and other diagnostic information is necessary to determine patient infection status. The expected result is Negative.  Fact Sheet for Patients: EntrepreneurPulse.com.au  Fact Sheet for  Healthcare Providers: IncredibleEmployment.be  This test is not yet approved or cleared by the [place] FDA and  has been authorized for detection and/or diagnosis of SARS-CoV-2 by FDA under an Emergency Use Authorization (EUA).  This EUA will remain in effect (meaning this test can be use d) for the duration of  the COVID-19 declaration under Section 564(b)(1) of the Act, 21 U.S.C. section 360bbb-3(b)(1), unless the authorization is terminated or revoked sooner.     Influenza A by PCR NEGATIVE NEGATIVE   Influenza B by PCR NEGATIVE NEGATIVE    Comment: (NOTE) The Xpert Xpress SARS-CoV-2/FLU/RSV plus assay is intended as an aid in the diagnosis of influenza from Nasopharyngeal swab specimens and should not be used as a sole basis for treatment. Nasal washings and aspirates are unacceptable for Xpert Xpress SARS-CoV-2/FLU/RSV testing.  Fact Sheet for Patients: EntrepreneurPulse.com.au  Fact Sheet for Healthcare Providers: IncredibleEmployment.be  This test is not yet approved or cleared by the [place] FDA and has been authorized for detection and/or diagnosis of SARS-CoV-2 by FDA under an Emergency Use Authorization (EUA). This EUA will remain in effect (meaning this test can be used) for the duration of the COVID-19 declaration under Section 564(b)(1) of the Act, 21 U.S.C. section 360bbb-3(b)(1), unless the authorization is terminated or revoked.  Performed at [facility], [place] [address], [place], [place] [address]   CBC with Differential/Platelet     Status: Abnormal   Collection Time: 03/23/2021  5:02  PM  Result Value Ref Range   WBC 13.2 (H) 4.0 - 10.5 K/uL   RBC 4.20 (L) 4.22 - 5.81 MIL/uL   Hemoglobin 11.3 (L) 13.0 - 17.0 g/dL   HCT 34.1 (L) 39.0 - 52.0 %   MCV 81.2 80.0 - 100.0 fL   MCH 26.9 26.0 - 34.0 pg   MCHC 33.1 30.0 - 36.0 g/dL   RDW 14.8 11.5 - 15.5 %   Platelets 240 150 - 400 K/uL    nRBC 0.0 0.0 - 0.2 %   Neutrophils Relative % 83 %   Neutro Abs 11.1 (H) 1.7 - 7.7 K/uL   Lymphocytes Relative 9 %   Lymphs Abs 1.2 0.7 - 4.0 K/uL   Monocytes Relative 6 %   Monocytes Absolute 0.7 0.1 - 1.0 K/uL   Eosinophils Relative 0 %   Eosinophils Absolute 0.0 0.0 - 0.5 K/uL   Basophils Relative 1 %   Basophils Absolute 0.1 0.0 - 0.1 K/uL   Immature Granulocytes 1 %   Abs Immature Granulocytes 0.06 0.00 - 0.07 K/uL    Comment: Performed at [facility], [address], [place], [place] [address]  Comprehensive metabolic panel     Status: Abnormal   Collection Time: 03/24/2021  5:02 PM  Result Value Ref Range   Sodium 134 (L) 135 - 145 mmol/L   Potassium 4.9 3.5 - 5.1 mmol/L   Chloride 95 (L) 98 - 111 mmol/L   CO2 24 22 - 32 mmol/L   Glucose, Bld 82 70 - 99 mg/dL    Comment: Glucose reference range applies only to samples taken after fasting for at least 8 hours.   BUN 35 (H) 6 - 20 mg/dL   Creatinine, Ser 7.98 (H) 0.61 - 1.24 mg/dL   Calcium 8.5 (L) 8.9 - 10.3 mg/dL   Total Protein 6.4 (L) 6.5 - 8.1 g/dL   Albumin 2.8 (L) 3.5 - 5.0 g/dL   AST 31 15 - 41 U/L   ALT 22 0 - 44 U/L   Alkaline Phosphatase 179 (H) 38 - 126 U/L   Total Bilirubin 1.4 (H) 0.3 - 1.2 mg/dL   GFR, Estimated 7 (L) >60 mL/min    Comment: (NOTE) Calculated using the CKD-EPI Creatinine Equation (2021)    Anion gap 15 5 - 15    Comment: Performed at [name], [place] [address], [place], [place] [address]  Lipase, blood     Status: None   Collection Time: 03/12/2021  5:02 PM  Result Value Ref Range   Lipase 31 11 - 51 U/L    Comment: Performed at [facility], [place] [address], [place], [place] [address]  Ammonia     Status: None   Collection Time: 03/27/2021  5:02 PM  Result Value Ref Range   Ammonia 35 9 - 35 umol/L    Comment: Performed at [facility], [place] [address], [place],  [address]  CK     Status: None   Collection Time: 03/18/2021  5:02 PM  Result Value Ref Range    Total CK 287 49 - 397 U/L    Comment: Performed at [facility], [place] [address], [place], [place] [address]  Lactic acid, plasma     Status: Abnormal   Collection Time: 03/14/2021  5:02 PM  Result Value Ref Range   Lactic Acid, Venous 2.0 (HH) 0.5 - 1.9 mmol/L    Comment: CRITICAL RESULT CALLED TO, READ BACK  BY AND VERIFIED WITH: Marlena Clipper [id number] 04/10/2021 WBOND Performed at [facility], [place] [address], [place], [place] [address]   Troponin I (High Sensitivity)     Status: Abnormal   Collection Time: 04/07/2021  5:02 PM  Result Value Ref Range   Troponin I (High Sensitivity) 46 (H) <18 ng/L    Comment: (NOTE) Elevated high sensitivity troponin I (hsTnI) values and significant  changes across serial measurements may suggest ACS but many other  chronic and acute conditions are known to elevate hsTnI results.  Refer to the "Links" section for chest pain algorithms and additional  guidance. Performed at [facility], [place] [address], [place], [place] [address]   CBG monitoring, ED     Status: None   Collection Time: 03/24/2021  5:03 PM  Result Value Ref Range   Glucose-Capillary 73 70 - 99 mg/dL    Comment: Glucose reference range applies only to samples taken after fasting for at least 8 hours.   Comment 1 Notify RN    Comment 2 Document in Chart   I-stat chem 8, ED (not at [facility] or [facility])     Status: Abnormal   Collection Time: 03/30/2021  5:11 PM  Result Value Ref Range   Sodium 133 (L) 135 - 145 mmol/L   Potassium 4.7 3.5 - 5.1 mmol/L   Chloride 100 98 - 111 mmol/L   BUN 36 (H) 6 - 20 mg/dL   Creatinine, Ser 8.60 (H) 0.61 - 1.24 mg/dL   Glucose, Bld 82 70 - 99 mg/dL    Comment: Glucose reference range applies only to samples taken after fasting for at least 8 hours.   Calcium, Ion 0.98 (L) 1.15 - 1.40 mmol/L   TCO2 26 22 - 32 mmol/L   Hemoglobin 11.9 (L) 13.0 - 17.0 g/dL   HCT 35.0 (L) 39.0 - 52.0 %  I-Stat venous blood gas, ([place] ED)     Status: Abnormal    Collection Time: 03/31/2021  5:26 PM  Result Value Ref Range   pH, Ven 7.450 (H) 7.250 - 7.430   pCO2, Ven 40.1 (L) 44.0 - 60.0 mmHg   pO2, Ven 39.0 32.0 - 45.0 mmHg   Bicarbonate 27.9 20.0 - 28.0 mmol/L   TCO2 29 22 - 32 mmol/L   O2 Saturation 76.0 %   Acid-Base Excess 4.0 (H) 0.0 - 2.0 mmol/L   Sodium 134 (L) 135 - 145 mmol/L   Potassium 5.0 3.5 - 5.1 mmol/L   Calcium, Ion 0.98 (L) 1.15 - 1.40 mmol/L   HCT 35.0 (L) 39.0 - 52.0 %   Hemoglobin 11.9 (L) 13.0 - 17.0 g/dL   Sample type VENOUS    Comment NOTIFIED PHYSICIAN    *Note: Due to a large number of results and/or encounters for the requested time period, some results have not been displayed. A complete set of results can be found in Results Review.    CT HEAD WO CONTRAST (5MM)  Result Date: 03/31/2021 CLINICAL DATA:  Altered mental status EXAM: CT HEAD WITHOUT CONTRAST CT MAXILLOFACIAL WITHOUT CONTRAST CT CERVICAL SPINE WITHOUT CONTRAST TECHNIQUE: Multidetector CT imaging of the head, cervical spine, and maxillofacial structures were performed using the standard protocol without intravenous contrast. Multiplanar CT image reconstructions of the cervical spine and maxillofacial structures were also generated. COMPARISON:  CT brain and cervical spine 03/13/2021 FINDINGS: CT HEAD FINDINGS Brain: No acute territorial infarction, hemorrhage, or intracranial mass. Mild atrophy and chronic small vessel ischemic changes of the white  matter. Stable ventricle size. Small chronic left cerebellar infarcts. Small chronic right posterior temporal infarct. Chronic lacunar infarcts within the thalamus and basal ganglia. Vascular: No hyperdense vessels. Vertebral and carotid vascular calcification Skull: None Other: Prominent scalp soft tissue swelling anteriorly, over the right parietal region and the bilateral occiput. CT MAXILLOFACIAL FINDINGS Osseous: Motion degradation limits assessment. Mastoid air cells grossly clear. Mandibular heads are normally  position. No mandibular fracture. Pterygoid plates and zygomatic arches appear intact. No acute nasal fracture Orbits: Negative. No traumatic or inflammatory finding. Sinuses: Fluid levels in the maxillary sinuses without definitive sinus wall fracture Soft tissues: Extensive right greater than left facial edema with edema at the base of the right neck. Marked periorbital and forehead soft tissue swelling. CT CERVICAL SPINE FINDINGS Alignment: Considerable motion degradation which limits assessment for fracture. Straightening cervical spine. Facet alignment grossly maintained Skull base and vertebrae: No gross fracture seen Soft tissues and spinal canal: No prevertebral fluid or swelling. No visible canal hematoma. Disc levels:  Disc space narrowing C3-C4. Upper chest: Lung apices clear Other: Marked edema within the subcutaneous soft tissues of the right sub occipital region and right greater than left neck. IMPRESSION: 1. Limited by motion degradation 2. No definite CT evidence for acute intracranial abnormality. Mild atrophy and chronic small vessel ischemic change of the white matter. Multiple small chronic infarcts 3. No definite acute facial bone fracture identified. Bilateral maxillary sinus fluid levels without definitive fracture 4. Considerable motion degradation of the cervical spine. No gross fracture seen 5. Extensive edema within the scalp soft tissues and right greater than left neck. Electronically Signed   By: Donavan Foil M.D.   On: 03/16/2021 18:55   CT Cervical Spine Wo Contrast  Result Date: 04/08/2021 CLINICAL DATA:  Altered mental status EXAM: CT HEAD WITHOUT CONTRAST CT MAXILLOFACIAL WITHOUT CONTRAST CT CERVICAL SPINE WITHOUT CONTRAST TECHNIQUE: Multidetector CT imaging of the head, cervical spine, and maxillofacial structures were performed using the standard protocol without intravenous contrast. Multiplanar CT image reconstructions of the cervical spine and maxillofacial structures  were also generated. COMPARISON:  CT brain and cervical spine 03/13/2021 FINDINGS: CT HEAD FINDINGS Brain: No acute territorial infarction, hemorrhage, or intracranial mass. Mild atrophy and chronic small vessel ischemic changes of the white matter. Stable ventricle size. Small chronic left cerebellar infarcts. Small chronic right posterior temporal infarct. Chronic lacunar infarcts within the thalamus and basal ganglia. Vascular: No hyperdense vessels. Vertebral and carotid vascular calcification Skull: None Other: Prominent scalp soft tissue swelling anteriorly, over the right parietal region and the bilateral occiput. CT MAXILLOFACIAL FINDINGS Osseous: Motion degradation limits assessment. Mastoid air cells grossly clear. Mandibular heads are normally position. No mandibular fracture. Pterygoid plates and zygomatic arches appear intact. No acute nasal fracture Orbits: Negative. No traumatic or inflammatory finding. Sinuses: Fluid levels in the maxillary sinuses without definitive sinus wall fracture Soft tissues: Extensive right greater than left facial edema with edema at the base of the right neck. Marked periorbital and forehead soft tissue swelling. CT CERVICAL SPINE FINDINGS Alignment: Considerable motion degradation which limits assessment for fracture. Straightening cervical spine. Facet alignment grossly maintained Skull base and vertebrae: No gross fracture seen Soft tissues and spinal canal: No prevertebral fluid or swelling. No visible canal hematoma. Disc levels:  Disc space narrowing C3-C4. Upper chest: Lung apices clear Other: Marked edema within the subcutaneous soft tissues of the right sub occipital region and right greater than left neck. IMPRESSION: 1. Limited by motion degradation 2. No definite CT evidence  for acute intracranial abnormality. Mild atrophy and chronic small vessel ischemic change of the white matter. Multiple small chronic infarcts 3. No definite acute facial bone fracture  identified. Bilateral maxillary sinus fluid levels without definitive fracture 4. Considerable motion degradation of the cervical spine. No gross fracture seen 5. Extensive edema within the scalp soft tissues and right greater than left neck. Electronically Signed   By: Donavan Foil M.D.   On: 03/17/2021 18:55   DG Pelvis Portable  Result Date: 03/18/2021 CLINICAL DATA:  Fall.  Unresponsive. EXAM: PORTABLE PELVIS 1-2 VIEWS COMPARISON:  None. FINDINGS: The cortical margins of the bony pelvis are intact. No fracture. Pubic symphysis and sacroiliac joints are congruent. Both femoral heads are well-seated in the respective acetabula. The bones are diffusely under mineralized. Advanced vascular calcifications, particularly for age. IMPRESSION: 1. No pelvic fracture. 2. Advanced vascular calcifications. Electronically Signed   By: Keith Rake M.D.   On: 03/27/2021 17:52   DG Chest Port 1 View  Result Date: 03/28/2021 CLINICAL DATA:  Fall, unresponsive. EXAM: PORTABLE CHEST 1 VIEW COMPARISON:  Chest x-ray 03/15/2021. FINDINGS: Cardiac silhouette is markedly enlarged, unchanged. There is central pulmonary vascular congestion. Multifocal airspace opacities in the mid and lower lungs bilaterally. Additionally, there is a band of airspace opacity in the right mid lung. There is no pleural effusion or pneumothorax identified. There are atherosclerotic calcifications of the aorta and subclavian arteries. No acute fractures are seen. IMPRESSION: 1. Cardiomegaly with central pulmonary vascular congestion. 2. Patchy bilateral multifocal airspace opacities worrisome for infection, right greater than left. Electronically Signed   By: Ronney Asters M.D.   On: 03/24/2021 17:51   CT Maxillofacial Wo Contrast  Result Date: 03/29/2021 CLINICAL DATA:  Altered mental status EXAM: CT HEAD WITHOUT CONTRAST CT MAXILLOFACIAL WITHOUT CONTRAST CT CERVICAL SPINE WITHOUT CONTRAST TECHNIQUE: Multidetector CT imaging of the head,  cervical spine, and maxillofacial structures were performed using the standard protocol without intravenous contrast. Multiplanar CT image reconstructions of the cervical spine and maxillofacial structures were also generated. COMPARISON:  CT brain and cervical spine 03/13/2021 FINDINGS: CT HEAD FINDINGS Brain: No acute territorial infarction, hemorrhage, or intracranial mass. Mild atrophy and chronic small vessel ischemic changes of the white matter. Stable ventricle size. Small chronic left cerebellar infarcts. Small chronic right posterior temporal infarct. Chronic lacunar infarcts within the thalamus and basal ganglia. Vascular: No hyperdense vessels. Vertebral and carotid vascular calcification Skull: None Other: Prominent scalp soft tissue swelling anteriorly, over the right parietal region and the bilateral occiput. CT MAXILLOFACIAL FINDINGS Osseous: Motion degradation limits assessment. Mastoid air cells grossly clear. Mandibular heads are normally position. No mandibular fracture. Pterygoid plates and zygomatic arches appear intact. No acute nasal fracture Orbits: Negative. No traumatic or inflammatory finding. Sinuses: Fluid levels in the maxillary sinuses without definitive sinus wall fracture Soft tissues: Extensive right greater than left facial edema with edema at the base of the right neck. Marked periorbital and forehead soft tissue swelling. CT CERVICAL SPINE FINDINGS Alignment: Considerable motion degradation which limits assessment for fracture. Straightening cervical spine. Facet alignment grossly maintained Skull base and vertebrae: No gross fracture seen Soft tissues and spinal canal: No prevertebral fluid or swelling. No visible canal hematoma. Disc levels:  Disc space narrowing C3-C4. Upper chest: Lung apices clear Other: Marked edema within the subcutaneous soft tissues of the right sub occipital region and right greater than left neck. IMPRESSION: 1. Limited by motion degradation 2. No  definite CT evidence for acute intracranial abnormality. Mild atrophy  and chronic small vessel ischemic change of the white matter. Multiple small chronic infarcts 3. No definite acute facial bone fracture identified. Bilateral maxillary sinus fluid levels without definitive fracture 4. Considerable motion degradation of the cervical spine. No gross fracture seen 5. Extensive edema within the scalp soft tissues and right greater than left neck. Electronically Signed   By: Donavan Foil M.D.   On: 04/04/2021 18:55    ROS: not obtained Blood pressure 120/79, pulse (!) 105, temperature 99 F (37.2 C), temperature source Rectal, resp. rate (!) 24, SpO2 97 %. Not performed -  exam details reviewed in other provider notes  Assessment/Plan: 60 year old BM with multiple medical issues including ESRD and non compliance-  multiple admissions in recent past-  last HD essentially on 9/5 evening-  now presents with hypoxia and altered MS 1 COVID +-  hypoxic and CXR looks like PNA-   being treated with decadron and actemra per primary team as well as cefepime and vanc 2 ESRD: normally MWF via AVG-  has not received regular HD due to hosps of late-  will plan for HD tonight off schedule for hypoxia  3 Hypertension: is now hypotensive-  volume removal as able using albumin in order to achieve UF 4. Anemia of ESRD: not a major issue-  has not needed ESA as OP of late 5. Metabolic Bone Disease: will order his calcitriol and sensipar as well as renvela when taking POs-  not now  6.  Altered MS-  HCT no obvious issue   Louis Meckel 03/18/2021, 8:48 PM

## 2021-03-20 ENCOUNTER — Inpatient Hospital Stay (HOSPITAL_COMMUNITY): Payer: Medicare Other

## 2021-03-20 DIAGNOSIS — J432 Centrilobular emphysema: Secondary | ICD-10-CM | POA: Diagnosis not present

## 2021-03-20 DIAGNOSIS — J9601 Acute respiratory failure with hypoxia: Secondary | ICD-10-CM | POA: Diagnosis not present

## 2021-03-20 DIAGNOSIS — G9340 Encephalopathy, unspecified: Secondary | ICD-10-CM | POA: Diagnosis not present

## 2021-03-20 DIAGNOSIS — M7989 Other specified soft tissue disorders: Secondary | ICD-10-CM

## 2021-03-20 DIAGNOSIS — N186 End stage renal disease: Secondary | ICD-10-CM | POA: Diagnosis not present

## 2021-03-20 DIAGNOSIS — U071 COVID-19: Secondary | ICD-10-CM

## 2021-03-20 DIAGNOSIS — R7989 Other specified abnormal findings of blood chemistry: Secondary | ICD-10-CM

## 2021-03-20 DIAGNOSIS — R609 Edema, unspecified: Secondary | ICD-10-CM | POA: Diagnosis not present

## 2021-03-20 DIAGNOSIS — I4891 Unspecified atrial fibrillation: Secondary | ICD-10-CM | POA: Diagnosis not present

## 2021-03-20 LAB — CBC WITH DIFFERENTIAL/PLATELET
Abs Immature Granulocytes: 0.08 10*3/uL — ABNORMAL HIGH (ref 0.00–0.07)
Basophils Absolute: 0 10*3/uL (ref 0.0–0.1)
Basophils Relative: 0 %
Eosinophils Absolute: 0 10*3/uL (ref 0.0–0.5)
Eosinophils Relative: 0 %
HCT: 29.1 % — ABNORMAL LOW (ref 39.0–52.0)
Hemoglobin: 9.8 g/dL — ABNORMAL LOW (ref 13.0–17.0)
Immature Granulocytes: 1 %
Lymphocytes Relative: 5 %
Lymphs Abs: 0.5 10*3/uL — ABNORMAL LOW (ref 0.7–4.0)
MCH: 26.9 pg (ref 26.0–34.0)
MCHC: 33.7 g/dL (ref 30.0–36.0)
MCV: 79.9 fL — ABNORMAL LOW (ref 80.0–100.0)
Monocytes Absolute: 0.2 10*3/uL (ref 0.1–1.0)
Monocytes Relative: 2 %
Neutro Abs: 10.8 10*3/uL — ABNORMAL HIGH (ref 1.7–7.7)
Neutrophils Relative %: 92 %
Platelets: 191 10*3/uL (ref 150–400)
RBC: 3.64 MIL/uL — ABNORMAL LOW (ref 4.22–5.81)
RDW: 14.5 % (ref 11.5–15.5)
WBC: 11.6 10*3/uL — ABNORMAL HIGH (ref 4.0–10.5)
nRBC: 0 % (ref 0.0–0.2)

## 2021-03-20 LAB — COMPREHENSIVE METABOLIC PANEL
ALT: 22 U/L (ref 0–44)
AST: 32 U/L (ref 15–41)
Albumin: 2.8 g/dL — ABNORMAL LOW (ref 3.5–5.0)
Alkaline Phosphatase: 182 U/L — ABNORMAL HIGH (ref 38–126)
Anion gap: 13 (ref 5–15)
BUN: 21 mg/dL — ABNORMAL HIGH (ref 6–20)
CO2: 25 mmol/L (ref 22–32)
Calcium: 8.5 mg/dL — ABNORMAL LOW (ref 8.9–10.3)
Chloride: 97 mmol/L — ABNORMAL LOW (ref 98–111)
Creatinine, Ser: 4.96 mg/dL — ABNORMAL HIGH (ref 0.61–1.24)
GFR, Estimated: 13 mL/min — ABNORMAL LOW (ref >60)
Glucose, Bld: 110 mg/dL — ABNORMAL HIGH (ref 70–99)
Potassium: 4.1 mmol/L (ref 3.5–5.1)
Sodium: 135 mmol/L (ref 135–145)
Total Bilirubin: 1.3 mg/dL — ABNORMAL HIGH (ref 0.3–1.2)
Total Protein: 6.1 g/dL — ABNORMAL LOW (ref 6.5–8.1)

## 2021-03-20 LAB — FERRITIN: Ferritin: 668 ng/mL — ABNORMAL HIGH (ref 24–336)

## 2021-03-20 LAB — C-REACTIVE PROTEIN: CRP: 9.4 mg/dL — ABNORMAL HIGH (ref <1.0)

## 2021-03-20 LAB — CBG MONITORING, ED: Glucose-Capillary: 102 mg/dL — ABNORMAL HIGH (ref 70–99)

## 2021-03-20 LAB — PROCALCITONIN: Procalcitonin: 1.11 ng/mL

## 2021-03-20 LAB — MAGNESIUM: Magnesium: 1.7 mg/dL (ref 1.7–2.4)

## 2021-03-20 LAB — D-DIMER, QUANTITATIVE: D-Dimer, Quant: 12.7 ug/mL-FEU — ABNORMAL HIGH (ref 0.00–0.50)

## 2021-03-20 LAB — PHOSPHORUS: Phosphorus: 4.7 mg/dL — ABNORMAL HIGH (ref 2.5–4.6)

## 2021-03-20 MED ORDER — DILTIAZEM HCL 60 MG PO TABS
30.0000 mg | ORAL_TABLET | Freq: Four times a day (QID) | ORAL | Status: DC
  Administered 2021-03-21 – 2021-03-25 (×13): 30 mg via ORAL
  Filled 2021-03-20 (×19): qty 1

## 2021-03-20 MED ORDER — LACTULOSE 10 GM/15ML PO SOLN
20.0000 g | Freq: Every day | ORAL | Status: DC
  Administered 2021-03-21 – 2021-03-25 (×4): 20 g via ORAL
  Filled 2021-03-20 (×5): qty 30

## 2021-03-20 MED ORDER — AMPICILLIN-SULBACTAM SODIUM 3 (2-1) G IJ SOLR
3.0000 g | Freq: Two times a day (BID) | INTRAMUSCULAR | Status: AC
  Administered 2021-03-20 – 2021-03-24 (×10): 3 g via INTRAVENOUS
  Filled 2021-03-20 (×11): qty 8

## 2021-03-20 MED ORDER — PANTOPRAZOLE SODIUM 40 MG IV SOLR
40.0000 mg | INTRAVENOUS | Status: DC
  Administered 2021-03-20 – 2021-03-22 (×3): 40 mg via INTRAVENOUS
  Filled 2021-03-20 (×3): qty 40

## 2021-03-20 MED ORDER — DILTIAZEM HCL 30 MG PO TABS
30.0000 mg | ORAL_TABLET | Freq: Four times a day (QID) | ORAL | Status: DC
  Filled 2021-03-20: qty 1

## 2021-03-20 MED ORDER — METOPROLOL TARTRATE 5 MG/5ML IV SOLN: 5.0000 mg | INTRAVENOUS | Status: DC | PRN

## 2021-03-20 MED ORDER — MAGNESIUM SULFATE 2 GM/50ML IV SOLN
2.0000 g | Freq: Once | INTRAVENOUS | Status: AC
  Administered 2021-03-20: 2 g via INTRAVENOUS
  Filled 2021-03-20: qty 50

## 2021-03-20 MED ORDER — ALBUMIN HUMAN 25 % IV SOLN
25.0000 g | INTRAVENOUS | Status: AC
  Administered 2021-03-20 (×2): 25 g via INTRAVENOUS
  Filled 2021-03-20 (×4): qty 100

## 2021-03-20 NOTE — Progress Notes (Signed)
PROGRESS NOTE                                                                                                                                                                                                             Patient Demographics:    [name], is a 60 y.o. male, DOB - 1960-08-25, RKY:[id number]  Outpatient Primary MD for the patient is Charlott Rakes, MD   Admit date - 03/27/2021   LOS - 1  Chief Complaint  Patient presents with   Altered Mental Status       Brief Narrative: Patient is a 60 y.o. male with PMHx of cirrhosis, ESRD on HD, chronic pain, substance abuse (mostly narcotics), COPD, poor adherence to treatment plan/noncompliance-brought in by EMS after he was found down at his home-confused/somnolent/hypoxic-he was found to have acute hypoxic respiratory failure, COVID-19 infection-and subsequently admitted to the hospitalist service.  See below for further details.  COVID-19 vaccinated status: Not known  Significant Events: 9/-9/6>> hospitalization for hypoxia due to volume overload-left AMA. 9/8>> presented t to [facility] for somnolence/hypoxia-COVID-19 infection/possible aspiration/volume overload-received steroids/Actemra/Remdesivir/Vanco/cefepime in the ED-nephrology consulted for urgent HD.  Subsequently admitted to [facility].  Significant studies: 9/8>> CXR: Pulmonary vascular congestion/patchy bilateral multifocal infiltrates. 9/9>> x-ray pelvis: No fracture. 9/8>> CT head: No acute intracranial abnormalities. 9/8>> CT C-spine: No fracture 9/8>> CT maxillofacial: No fracture 9/8>> CT abdomen/pelvis: Large volume ascites in abdomen/pelvis 9/8>> CT chest: Compressive atelectasis/small bilateral pleural effusions, mild right paratracheal lymphadenopathy stable since prior study. 9/9>> bilateral lower extremity Doppler: No DVT.  COVID-19 medications: Steroids: 9/8>> Remdesivir: 9/8>> Actemra: 9/8 x  1  Antibiotics: Vancomycin: 9/8 x 1 Cefepime: 9/8 x 1 Unasyn: 9/9>>  Microbiology data: 9/2>> COVID PCR: Negative 9/4>> COVID PCR: Negative 9/8>> COVID-19 PCR: Positive 9/8 >>blood culture: No growth  Procedures: None  Consults: Nephrology.  DVT prophylaxis: heparin injection 5,000 Units Start: 03/16/2021 2230   Subjective:   Seems to be more awake and alert compared to yesterday but still slow and somewhat confused.  Seems to be accumulation some oral secretions.  When I saw him earlier this morning-his NRB was on his forehead-his O2 saturations were in the 90s on room air.   Assessment  & Plan :   Acute Hypoxic Resp Failure due to Covid 19 Viral pneumonia/aspiration pneumonia/pulmonary edema in the setting of ESRD: Apparently initially  was on a nonrebreather mask-underwent HD last night-started on broad-spectrum antibiotics/steroid/Remdesivir-seems to have improved.  When I walked in this morning-his NRB was on top of his head-and his O2 saturations on room air was in the 90s.  Continue to monitor closely-and titrate down O2 as tolerated.  COVID-19 pneumonitis: Continue steroids/Remdesivir-s/p Actemra in the ED yesterday.  Recent Labs    04/03/2021 2230 03/20/21 0630  DDIMER 13.66* 12.70*  FERRITIN 760* 668*  LDH 193*  --   CRP 6.0* 9.4*    Significantly elevated D-dimer: Hypoxia seems to have improved-lower extremity Dopplers are negative-doubt VTE-but given COVID-19 infection and significantly elevated D-dimer- will get a CT angio of the chest.  Suspected aspiration pneumonia: Accumulating secretions and "gurgling"-suspect may have aspirated when he was somnolent/confused.  Although accumulating secretions-coughing and is able to keep his airway patent.  Continue Unasyn-do not think he needs vancomycin/cefepime.  Follow cultures.  Keep n.p.o. until evaluated by SLP.  Acute metabolic toxic encephalopathy: Suspect this is from a combination of suspected aspiration  pneumonia/COVID-pneumonia and ongoing illicit narcotic use.  Ammonia levels were normal yesterday.  Improving-continue with supportive care.  ESRD: Underwent urgent HD last night-nephrology following.  A. fib with RVR: Heart rate in the low 100s-add scheduled Cardizem-continue as needed Lopressor.   Amiodarone on hold due to prolonged QTC.  Given his poor compliance to medications/substance abuse-not a long-term candidate for anticoagulation.  Liver cirrhosis: CT evidence of ascites-however abdomen is not tense-hold off on paracentesis-await further clinical improvement.  COPD: No wheezing-continue bronchodilators.  History of substance use disorder:cocaine and alcohol use.  Will need counseling when he is awake more awake and alert.  Noncompliance to medication/dialysis  Other issues: DNR in place-given multiple medical problems-poor compliance to treatment regimen-long-term prognosis is poor.  [name]-her sister understands overall tenuous situation and that he is essentially "living on borrowed time".  Obesity: Estimated body mass index is 29.37 kg/m as calculated from the following:   Height as of 03/16/21: 5\' 3"  (1.6 m).   Weight as of this encounter: 75.2 kg.   RN pressure injury documentation: Pressure Injury 01/14/20 Buttocks Right;Upper Stage 2 -  Partial thickness loss of dermis presenting as a shallow open injury with a red, pink wound bed without slough. areas of healing (Active)  01/14/20 0000  Location: Buttocks  Location Orientation: Right;Upper  Staging: Stage 2 -  Partial thickness loss of dermis presenting as a shallow open injury with a red, pink wound bed without slough.  Wound Description (Comments): areas of healing  Present on Admission: Yes    GI prophylaxis: PPI  ABG:    Component Value Date/Time   PHART 7.434 03/16/2021 0831   PCO2ART 45.2 03/16/2021 0831   PO2ART 79 (L) 03/16/2021 0831   HCO3 27.9 04/05/2021 1726   TCO2 29 04/08/2021 1726    ACIDBASEDEF 22.0 (H) 09/07/2020 1731   O2SAT 76.0 03/24/2021 1726    Vent Settings: N/A  Condition - Guarded  Family Communication  :  Sister-[name]-over the phone-understands poor prognoses  Code Status :  DNR-reconfirmed with patient's sister.  Diet :  Diet Order             Diet NPO time specified  Diet effective now                    Disposition Plan  :   Status is: Inpatient  Remains inpatient appropriate because:Inpatient level of care appropriate due to severity of illness  Dispo: The patient is from:  Home              Anticipated d/c is to: Home              Patient currently is not medically stable to d/c.   Difficult to place patient No     Barriers to discharge: Hypoxia requiring O2 supplementation/complete 5 days of IV Remdesivir  Antimicorbials  :    Anti-infectives (From admission, onward)    Start     Dose/Rate Route Frequency Ordered Stop   03/20/21 1000  remdesivir 100 mg in sodium chloride 0.9 % 100 mL IVPB       See Hyperspace for full Linked Orders Report.   100 mg 200 mL/hr over 30 Minutes Intravenous Daily 03/26/2021 2026 03/24/21 0959   03/20/21 1000  Ampicillin-Sulbactam (UNASYN) 3 g in sodium chloride 0.9 % 100 mL IVPB        3 g 200 mL/hr over 30 Minutes Intravenous Every 12 hours 03/20/21 0905     03/26/2021 2030  remdesivir 200 mg in sodium chloride 0.9% 250 mL IVPB       See Hyperspace for full Linked Orders Report.   200 mg 580 mL/hr over 30 Minutes Intravenous Once 03/12/2021 2026 04/09/2021 2342   03/22/2021 1845  vancomycin (VANCOREADY) IVPB 1500 mg/300 mL        1,500 mg 150 mL/hr over 120 Minutes Intravenous  Once 04/01/2021 1835 03/20/21 0113   03/21/2021 1845  ceFEPIme (MAXIPIME) 1 g in sodium chloride 0.9 % 100 mL IVPB        1 g 200 mL/hr over 30 Minutes Intravenous  Once 03/17/2021 1835 03/20/21 0112       Inpatient Medications  Scheduled Meds:  albuterol  2 puff Inhalation Q6H   Chlorhexidine Gluconate Cloth   6 each Topical Q0600   heparin  5,000 Units Subcutaneous Q8H   methylPREDNISolone (SOLU-MEDROL) injection  0.5 mg/kg Intravenous Q12H   Followed by   Derrill Memo ON 03/23/2021] predniSONE  50 mg Oral Daily   Continuous Infusions:  sodium chloride     sodium chloride     ampicillin-sulbactam (UNASYN) IV 3 g (03/20/21 1030)   remdesivir 100 mg in NS 100 mL     PRN Meds:.sodium chloride, sodium chloride, alteplase, heparin, lidocaine (PF), lidocaine-prilocaine, metoprolol tartrate, naLOXone (NARCAN)  injection, pentafluoroprop-tetrafluoroeth   Time Spent in minutes  35     See all Orders from today for further details   Oren Binet M.D on 03/20/2021 at 11:11 AM  To page go to [email or web address] - use universal password  [facility] -  Office  [phone]    Objective:   Vitals:   03/20/21 1015 03/20/21 1030 03/20/21 1045 03/20/21 1100  BP: (!) 89/21 90/73 (!) 58/21 (!) 112/52  Pulse:    69  Resp: 20 (!) 22 19 (!) 23  Temp:      TempSrc:      SpO2:   99% 99%  Weight:        Wt Readings from Last 3 Encounters:  03/20/21 75.2 kg  03/17/21 72.7 kg  03/13/21 77.6 kg     Intake/Output Summary (Last 24 hours) at 03/20/2021 1111 Last data filed at 03/20/2021 0943 Gross per 24 hour  Intake 1660.08 ml  Output 2600 ml  Net -939.92 ml     Physical Exam Gen Exam: Awake-still a bit somnolent but able to follow simple questions. HEENT:atraumatic, normocephalic Chest: B/L clear to auscultation anteriorly CVS:S1S2 regular Abdomen:soft non tender, non distended Extremities:no  edema.  Missing several fingers in his right hand. Neurology: Moving all 4 extremities-difficult exam. Skin: no rash   Data Review:    CBC Recent Labs  Lab 03/15/21 0500 03/15/21 0646 03/15/21 2126 03/16/21 0831 03/17/21 0550 03/18/2021 1702 03/16/2021 1711 03/15/2021 1726 03/20/21 0630  WBC 10.8*  --  10.4  --  11.4* 13.2*  --   --  11.6*  HGB 11.0*   < > 10.5*   < > 9.9* 11.3* 11.9* 11.9*  9.8*  HCT 32.3*   < > 30.5*   < > 28.3* 34.1* 35.0* 35.0* 29.1*  PLT 231  --  204  --  201 240  --   --  191  MCV 79.8*  --  78.4*  --  77.1* 81.2  --   --  79.9*  MCH 27.2  --  27.0  --  27.0 26.9  --   --  26.9  MCHC 34.1  --  34.4  --  35.0 33.1  --   --  33.7  RDW 14.5  --  14.5  --  14.5 14.8  --   --  14.5  LYMPHSABS  --   --  1.0  --   --  1.2  --   --  0.5*  MONOABS  --   --  0.7  --   --  0.7  --   --  0.2  EOSABS  --   --  0.2  --   --  0.0  --   --  0.0  BASOSABS  --   --  0.1  --   --  0.1  --   --  0.0   < > = values in this interval not displayed.    Chemistries  Recent Labs  Lab 03/13/21 1245 03/13/21 1307 03/15/21 0500 03/15/21 0646 03/15/21 2126 03/16/21 0421 03/16/21 0831 03/17/21 0550 04/08/2021 1702 03/16/2021 1711 03/24/2021 1726 03/20/21 0630  NA 132*   < > 132*   < > 135 134*   < > 135 134* 133* 134* 135  K 4.3   < > 4.2   < > 4.3 4.7   < > 3.9 4.9 4.7 5.0 4.1  CL 93*   < > 90*  --  95* 93*  --  97* 95* 100  --  97*  CO2 25  --  26  --  29 26  --  26 24  --   --  25  GLUCOSE 83   < > 109*  --  91 104*  --  167* 82 82  --  110*  BUN 25*   < > 33*  --  20 22*  --  16 35* 36*  --  21*  CREATININE 8.01*   < > 9.96*  --  6.68* 7.18*  --  4.70* 7.98* 8.60*  --  4.96*  CALCIUM 7.3*  --  8.3*  --  8.2* 8.6*  --  7.6* 8.5*  --   --  8.5*  MG  --   --   --   --   --   --   --   --   --   --   --  1.7  AST 29  --  25  --   --   --   --  28 31  --   --  32  ALT 14  --  15  --   --   --   --  13 22  --   --  22  ALKPHOS 187*  --  205*  --   --   --   --  165* 179*  --   --  182*  BILITOT 0.8  --  0.8  --   --   --   --  0.6 1.4*  --   --  1.3*   < > = values in this interval not displayed.   ------------------------------------------------------------------------------------------------------------------ Recent Labs    03/27/2021 2230  TRIG 173*    Lab Results  Component Value Date   HGBA1C 4.8 01/26/2020    ------------------------------------------------------------------------------------------------------------------ No results for input(s): TSH, T4TOTAL, T3FREE, THYROIDAB in the last 72 hours.  Invalid input(s): FREET3 ------------------------------------------------------------------------------------------------------------------ Recent Labs    03/12/2021 2230 03/20/21 0630  FERRITIN 760* 668*    Coagulation profile No results for input(s): INR, PROTIME in the last 168 hours.  Recent Labs    03/26/2021 2230 03/20/21 0630  DDIMER 13.66* 12.70*    Cardiac Enzymes No results for input(s): CKMB, TROPONINI, MYOGLOBIN in the last 168 hours.  Invalid input(s): CK ------------------------------------------------------------------------------------------------------------------    Component Value Date/Time   BNP 1,181.6 (H) 03/15/2021 2126    Micro Results Recent Results (from the past 240 hour(s))  Resp Panel by RT-PCR (Flu A&B, Covid) Nasopharyngeal Swab     Status: None   Collection Time: 03/13/21  1:10 PM   Specimen: Nasopharyngeal Swab; Nasopharyngeal(NP) swabs in vial transport medium  Result Value Ref Range Status   SARS Coronavirus 2 by RT PCR NEGATIVE NEGATIVE Final    Comment: (NOTE) SARS-CoV-2 target nucleic acids are NOT DETECTED.  The SARS-CoV-2 RNA is generally detectable in upper respiratory specimens during the acute phase of infection. The lowest concentration of SARS-CoV-2 viral copies this assay can detect is 138 copies/mL. A negative result does not preclude SARS-Cov-2 infection and should not be used as the sole basis for treatment or other patient management decisions. A negative result may occur with  improper specimen collection/handling, submission of specimen other than nasopharyngeal swab, presence of viral mutation(s) within the areas targeted by this assay, and inadequate number of viral copies(<138 copies/mL). A negative result must be  combined with clinical observations, patient history, and epidemiological information. The expected result is Negative.  Fact Sheet for Patients:  EntrepreneurPulse.com.au  Fact Sheet for Healthcare Providers:  IncredibleEmployment.be  This test is no t yet approved or cleared by the [place] FDA and  has been authorized for detection and/or diagnosis of SARS-CoV-2 by FDA under an Emergency Use Authorization (EUA). This EUA will remain  in effect (meaning this test can be used) for the duration of the COVID-19 declaration under Section 564(b)(1) of the Act, 21 U.S.C.section 360bbb-3(b)(1), unless the authorization is terminated  or revoked sooner.       Influenza A by PCR NEGATIVE NEGATIVE Final   Influenza B by PCR NEGATIVE NEGATIVE Final    Comment: (NOTE) The Xpert Xpress SARS-CoV-2/FLU/RSV plus assay is intended as an aid in the diagnosis of influenza from Nasopharyngeal swab specimens and should not be used as a sole basis for treatment. Nasal washings and aspirates are unacceptable for Xpert Xpress SARS-CoV-2/FLU/RSV testing.  Fact Sheet for Patients: EntrepreneurPulse.com.au  Fact Sheet for Healthcare Providers: IncredibleEmployment.be  This test is not yet approved or cleared by the [place] FDA and has been authorized for detection and/or diagnosis of SARS-CoV-2 by FDA under an Emergency Use Authorization (EUA). This EUA will remain in effect (meaning this test can  be used) for the duration of the COVID-19 declaration under Section 564(b)(1) of the Act, 21 U.S.C. section 360bbb-3(b)(1), unless the authorization is terminated or revoked.  Performed at [facility], [place] [address] [place], [place] [address]   Resp Panel by RT-PCR (Flu A&B, Covid) Nasopharyngeal Swab     Status: None   Collection Time: 03/15/21 10:03 AM   Specimen: Nasopharyngeal Swab; Nasopharyngeal(NP) swabs  in vial transport medium  Result Value Ref Range Status   SARS Coronavirus 2 by RT PCR NEGATIVE NEGATIVE Final    Comment: (NOTE) SARS-CoV-2 target nucleic acids are NOT DETECTED.  The SARS-CoV-2 RNA is generally detectable in upper respiratory specimens during the acute phase of infection. The lowest concentration of SARS-CoV-2 viral copies this assay can detect is 138 copies/mL. A negative result does not preclude SARS-Cov-2 infection and should not be used as the sole basis for treatment or other patient management decisions. A negative result may occur with  improper specimen collection/handling, submission of specimen other than nasopharyngeal swab, presence of viral mutation(s) within the areas targeted by this assay, and inadequate number of viral copies(<138 copies/mL). A negative result must be combined with clinical observations, patient history, and epidemiological information. The expected result is Negative.  Fact Sheet for Patients:  EntrepreneurPulse.com.au  Fact Sheet for Healthcare Providers:  IncredibleEmployment.be  This test is no t yet approved or cleared by the [place] FDA and  has been authorized for detection and/or diagnosis of SARS-CoV-2 by FDA under an Emergency Use Authorization (EUA). This EUA will remain  in effect (meaning this test can be used) for the duration of the COVID-19 declaration under Section 564(b)(1) of the Act, 21 U.S.C.section 360bbb-3(b)(1), unless the authorization is terminated  or revoked sooner.       Influenza A by PCR NEGATIVE NEGATIVE Final   Influenza B by PCR NEGATIVE NEGATIVE Final    Comment: (NOTE) The Xpert Xpress SARS-CoV-2/FLU/RSV plus assay is intended as an aid in the diagnosis of influenza from Nasopharyngeal swab specimens and should not be used as a sole basis for treatment. Nasal washings and aspirates are unacceptable for Xpert Xpress  SARS-CoV-2/FLU/RSV testing.  Fact Sheet for Patients: EntrepreneurPulse.com.au  Fact Sheet for Healthcare Providers: IncredibleEmployment.be  This test is not yet approved or cleared by the [place] FDA and has been authorized for detection and/or diagnosis of SARS-CoV-2 by FDA under an Emergency Use Authorization (EUA). This EUA will remain in effect (meaning this test can be used) for the duration of the COVID-19 declaration under Section 564(b)(1) of the Act, 21 U.S.C. section 360bbb-3(b)(1), unless the authorization is terminated or revoked.  Performed at [facility], [place] [address] [place], [place] [address]   Blood culture (routine x 2)     Status: None (Preliminary result)   Collection Time: 03/23/2021  4:45 PM   Specimen: BLOOD  Result Value Ref Range Status   Specimen Description BLOOD SITE NOT SPECIFIED  Final   Special Requests   Final    BOTTLES DRAWN AEROBIC AND ANAEROBIC Blood Culture results may not be optimal due to an inadequate volume of blood received in culture bottles   Culture   Final    NO GROWTH < 12 HOURS Performed at [facility], [place] [address] [place],  [address]    Report Status PENDING  Incomplete  Resp Panel by RT-PCR (Flu A&B, Covid) Nasopharyngeal Swab     Status: Abnormal   Collection Time: 04/04/2021  5:01  PM   Specimen: Nasopharyngeal Swab; Nasopharyngeal(NP) swabs in vial transport medium  Result Value Ref Range Status   SARS Coronavirus 2 by RT PCR POSITIVE (A) NEGATIVE Final    Comment: RESULT CALLED TO, READ BACK BY AND VERIFIED WITH: RYAN HARDY 03/22/2021 @1858  BY JW (NOTE) SARS-CoV-2 target nucleic acids are DETECTED.  The SARS-CoV-2 RNA is generally detectable in upper respiratory specimens during the acute phase of infection. Positive results are indicative of the presence of the identified virus, but do not rule out bacterial infection or co-infection with other pathogens  not detected by the test. Clinical correlation with patient history and other diagnostic information is necessary to determine patient infection status. The expected result is Negative.  Fact Sheet for Patients: EntrepreneurPulse.com.au  Fact Sheet for Healthcare Providers: IncredibleEmployment.be  This test is not yet approved or cleared by the [place] FDA and  has been authorized for detection and/or diagnosis of SARS-CoV-2 by FDA under an Emergency Use Authorization (EUA).  This EUA will remain in effect (meaning this test can be use d) for the duration of  the COVID-19 declaration under Section 564(b)(1) of the Act, 21 U.S.C. section 360bbb-3(b)(1), unless the authorization is terminated or revoked sooner.     Influenza A by PCR NEGATIVE NEGATIVE Final   Influenza B by PCR NEGATIVE NEGATIVE Final    Comment: (NOTE) The Xpert Xpress SARS-CoV-2/FLU/RSV plus assay is intended as an aid in the diagnosis of influenza from Nasopharyngeal swab specimens and should not be used as a sole basis for treatment. Nasal washings and aspirates are unacceptable for Xpert Xpress SARS-CoV-2/FLU/RSV testing.  Fact Sheet for Patients: EntrepreneurPulse.com.au  Fact Sheet for Healthcare Providers: IncredibleEmployment.be  This test is not yet approved or cleared by the [place] FDA and has been authorized for detection and/or diagnosis of SARS-CoV-2 by FDA under an Emergency Use Authorization (EUA). This EUA will remain in effect (meaning this test can be used) for the duration of the COVID-19 declaration under Section 564(b)(1) of the Act, 21 U.S.C. section 360bbb-3(b)(1), unless the authorization is terminated or revoked.  Performed at [facility], [place] [address], [place], [place] [address]   Blood culture (routine x 2)     Status: None (Preliminary result)   Collection Time: 04/06/2021  5:02 PM    Specimen: BLOOD LEFT ARM  Result Value Ref Range Status   Specimen Description BLOOD LEFT ARM  Final   Special Requests   Final    BOTTLES DRAWN AEROBIC AND ANAEROBIC Blood Culture adequate volume   Culture   Final    NO GROWTH < 24 HOURS Performed at [facility], [place] [address] [place], [place] [address]    Report Status PENDING  Incomplete    Radiology Reports CT ABDOMEN PELVIS WO CONTRAST  Result Date: 04/08/2021 CLINICAL DATA:  Fall, abdominal trauma, found on floor EXAM: CT CHEST, ABDOMEN AND PELVIS WITHOUT CONTRAST TECHNIQUE: Multidetector CT imaging of the chest, abdomen and pelvis was performed following the standard protocol without IV contrast. COMPARISON:  None. FINDINGS: CT CHEST FINDINGS Cardiovascular: Cardiomegaly. Diffuse coronary artery and aortic calcifications. Small pericardial effusion. Mediastinum/Nodes: Mild right paratracheal lymph nodes measuring approximately 1.2 cm in short axis diameter, stable since prior study. No visible axillary or hilar adenopathy. Lungs/Pleura: Compressive atelectasis in the lower lobes. Platelike opacities in both mid and lower lungs compatible with atelectasis.Small bilateral effusions, stable since prior study. Musculoskeletal: No acute bony abnormality. CT ABDOMEN PELVIS FINDINGS Hepatobiliary: No focal hepatic abnormality.  Gallbladder unremarkable. Pancreas: No focal abnormality or ductal dilatation. Spleen: No focal abnormality.  Normal size. Adrenals/Urinary Tract: Atrophic kidneys with severe renovascular calcifications. No hydronephrosis. No perinephric hematoma or adrenal hemorrhage. Urinary bladder decompressed. Stomach/Bowel: Stomach, large and small bowel grossly unremarkable. Vascular/Lymphatic: Heavily calcified aorta, iliac vessels and branch vessels. No evidence of aneurysm or adenopathy. Reproductive: No visible focal abnormality. Other: Large volume ascites throughout the abdomen and pelvis. Musculoskeletal: No acute bony  abnormality. IMPRESSION: No evidence of traumatic injury to the chest, abdomen or pelvis. Small bilateral pleural effusions. Cardiomegaly. Mild mediastinal adenopathy, stable since prior study. Large volume ascites in the abdomen and pelvis. Coronary artery disease.  Diffuse arterial calcifications. Electronically Signed   By: Rolm Baptise M.D.   On: 03/24/2021 21:05   DG Chest 1 View  Result Date: 03/15/2021 CLINICAL DATA:  Dyspnea EXAM: CHEST  1 VIEW COMPARISON:  5:11 a.m. FINDINGS: Lung volumes are small and pulmonary insufflation has slightly diminished since prior examination. Mild left basilar atelectasis. No pneumothorax or pleural effusion. Cardiomegaly is stable when accounting for poor pulmonary insufflation. Central pulmonary vascular congestion has improved slightly in the interval. No overt pulmonary edema. Advanced vascular calcifications are seen within the aortic arch and arch vasculature. IMPRESSION: Pulmonary hypoinflation. Stable cardiomegaly. Improved central pulmonary vascular congestion. Peripheral vascular disease. Electronically Signed   By: Fidela Salisbury M.D.   On: 03/15/2021 22:33   CT HEAD WO CONTRAST (5MM)  Result Date: 03/20/2021 CLINICAL DATA:  Altered mental status EXAM: CT HEAD WITHOUT CONTRAST CT MAXILLOFACIAL WITHOUT CONTRAST CT CERVICAL SPINE WITHOUT CONTRAST TECHNIQUE: Multidetector CT imaging of the head, cervical spine, and maxillofacial structures were performed using the standard protocol without intravenous contrast. Multiplanar CT image reconstructions of the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

## 2021-03-20 NOTE — Progress Notes (Signed)
[facility] Progress Note  Subjective: Patient not seen directly today given COVID-19 + status, utilizing data taken from chart +/- discussions w/ providers and staff.    Vitals:   03/20/21 1445 03/20/21 1500 03/20/21 1530 03/20/21 1630  BP: (!) 134/111 (!) 140/127 91/68 101/86  Pulse: 76     Resp: 17 (!) 24 18 17   Temp:      TempSrc:      SpO2: 98%     Weight:        Exam: Patient not seen directly today given COVID-19 + status, utilizing data taken from chart +/- discussions w/ providers and staff.      OP HD: [place] MWF   4h 450/1.5  69kg   2/2 bath  Hep none  R AVG Calcitriol 1.50mcg PO qHD-last dose 03/11/21 Sensipar 30mg  3x/weekly-last dose 03/09/21  Summary: 60 year old BM with multiple medical issues including ESRD and non compliance-  multiple admissions in recent past-  last HD essentially on 9/5 evening-  now presents with hypoxia and altered MS  Assessment/Plan:  COVID +/- hypoxia-  and CXR looks like PNA-   being treated with decadron + actemra per primary team as well as cefepime and vanc ESRD: normally MWF via AVG-  has not received regular HD due to hosps of late. Had HD overnight last night, came off at 4 in the morning today.  Labs are good today. Next HD on Monday.  Hypertension: is now hypotensive-  volume removal as able using albumin in order to achieve UF. 2.6 L off last night.  Anemia of ESRD: not a major issue-  has not needed ESA as OP of late Metabolic Bone Disease: will order his calcitriol and sensipar as well as renvela when taking POs Altered MS-  HCT no obvious issue   Rob Yliana Gravois 03/20/2021, 4:47 PM   Recent Labs  Lab 03/16/2021 1702 03/18/2021 1711 04/01/2021 1726 03/20/21 0630  K 4.9 4.7 5.0 4.1  BUN 35* 36*  --  21*  CREATININE 7.98* 8.60*  --  4.96*  CALCIUM 8.5*  --   --  8.5*  PHOS  --   --   --  4.7*  HGB 11.3* 11.9* 11.9* 9.8*   Inpatient medications:  albuterol  2 puff Inhalation Q6H   Chlorhexidine Gluconate Cloth  6  each Topical Q0600   diltiazem  30 mg Oral Q6H   heparin  5,000 Units Subcutaneous Q8H   lactulose  20 g Oral Daily   methylPREDNISolone (SOLU-MEDROL) injection  0.5 mg/kg Intravenous Q12H   Followed by   Derrill Memo ON 03/23/2021] predniSONE  50 mg Oral Daily   pantoprazole (PROTONIX) IV  40 mg Intravenous Q24H    sodium chloride     sodium chloride     ampicillin-sulbactam (UNASYN) IV Stopped (03/20/21 1158)   remdesivir 100 mg in NS 100 mL Stopped (03/20/21 1158)   sodium chloride, sodium chloride, alteplase, heparin, lidocaine (PF), lidocaine-prilocaine, metoprolol tartrate, naLOXone (NARCAN)  injection, pentafluoroprop-tetrafluoroeth

## 2021-03-20 NOTE — ED Notes (Signed)
Pt O2 saturating has been placed on multiple fingers, earlobe, and forehead. Pt O2 sensor cord has been replaced but still unable to get a steady pleth reading. Pt is not showing signs of distress, he moves eyes spontaneously, and responds to questions. He is currently on 2L nasal canula.

## 2021-03-20 NOTE — ED Notes (Signed)
Pt cleaned up with partial linen change

## 2021-03-20 NOTE — ED Notes (Signed)
Attempted report x1 left phone number with Network engineer.

## 2021-03-20 NOTE — ED Notes (Signed)
Pt cleaned up with full linen change 

## 2021-03-20 NOTE — Progress Notes (Signed)
Lower extremity venous has been completed.   Preliminary results in CV Proc.   Jinny Blossom Faryal Marxen 03/20/2021 10:52 AM

## 2021-03-20 NOTE — ED Notes (Signed)
Attempted report x 2 

## 2021-03-20 NOTE — Progress Notes (Signed)
Pharmacy Antibiotic Note  [name] is a 60 y.o. male admitted on 04/04/2021 found down and hypoxic, concern for aspiration.  Pharmacy has been consulted for Unasyn dosing.  ESRD-HD  Plan: Unasyn 3g IV every 12 hours Monitor HD schedule, clinical progression and LOT  Weight: 75.2 kg (165 lb 12.6 oz)  Temp (24hrs), Avg:99 F (37.2 C), Min:98.9 F (37.2 C), Max:99 F (37.2 C)  Recent Labs  Lab 03/15/21 0500 03/15/21 2126 03/16/21 0421 03/17/21 0550 04/10/2021 1702 03/17/2021 1711 04/02/2021 2240 03/20/21 0630  WBC 10.8* 10.4  --  11.4* 13.2*  --   --  11.6*  CREATININE 9.96* 6.68* 7.18* 4.70* 7.98* 8.60*  --  4.96*  LATICACIDVEN  --   --   --   --  2.0*  --  1.6  --     Estimated Creatinine Clearance: 14.7 mL/min (A) (by C-G formula based on SCr of 4.96 mg/dL (H)).    Allergies  Allergen Reactions   Tramadol Itching and Other (See Comments)    Other reaction(s): Unknown   Grass Extracts [Gramineae Pollens]     Other reaction(s): Sneezing   Morphine And Related Other (See Comments)    Stomach pain   Pollen Extract Other (See Comments)    Other reaction(s): Sneezing (finding)   Acetaminophen Nausea Only    Stomach ache    Aspirin Itching and Other (See Comments)    STOMACH PAIN  Other reaction(s): Unknown   Clonidine Derivatives Itching    Bertis Ruddy, PharmD Clinical Pharmacist ED Pharmacist Phone # [phone] 03/20/2021 9:03 AM

## 2021-03-20 NOTE — ED Notes (Signed)
Pt washed up with full linen change

## 2021-03-21 ENCOUNTER — Inpatient Hospital Stay (HOSPITAL_COMMUNITY): Payer: Medicare Other

## 2021-03-21 DIAGNOSIS — G9340 Encephalopathy, unspecified: Secondary | ICD-10-CM | POA: Diagnosis not present

## 2021-03-21 LAB — CBC WITH DIFFERENTIAL/PLATELET
Abs Immature Granulocytes: 0.04 10*3/uL (ref 0.00–0.07)
Basophils Absolute: 0 10*3/uL (ref 0.0–0.1)
Basophils Relative: 0 %
Eosinophils Absolute: 0 10*3/uL (ref 0.0–0.5)
Eosinophils Relative: 0 %
HCT: 28.9 % — ABNORMAL LOW (ref 39.0–52.0)
Hemoglobin: 10.1 g/dL — ABNORMAL LOW (ref 13.0–17.0)
Immature Granulocytes: 1 %
Lymphocytes Relative: 9 %
Lymphs Abs: 0.6 10*3/uL — ABNORMAL LOW (ref 0.7–4.0)
MCH: 27 pg (ref 26.0–34.0)
MCHC: 34.9 g/dL (ref 30.0–36.0)
MCV: 77.3 fL — ABNORMAL LOW (ref 80.0–100.0)
Monocytes Absolute: 0.4 10*3/uL (ref 0.1–1.0)
Monocytes Relative: 6 %
Neutro Abs: 5.7 10*3/uL (ref 1.7–7.7)
Neutrophils Relative %: 84 %
Platelets: 204 10*3/uL (ref 150–400)
RBC: 3.74 MIL/uL — ABNORMAL LOW (ref 4.22–5.81)
RDW: 14.5 % (ref 11.5–15.5)
WBC: 6.7 10*3/uL (ref 4.0–10.5)
nRBC: 0 % (ref 0.0–0.2)

## 2021-03-21 LAB — COMPREHENSIVE METABOLIC PANEL
ALT: 19 U/L (ref 0–44)
AST: 24 U/L (ref 15–41)
Albumin: 2.5 g/dL — ABNORMAL LOW (ref 3.5–5.0)
Alkaline Phosphatase: 160 U/L — ABNORMAL HIGH (ref 38–126)
Anion gap: 15 (ref 5–15)
BUN: 34 mg/dL — ABNORMAL HIGH (ref 6–20)
CO2: 25 mmol/L (ref 22–32)
Calcium: 8.5 mg/dL — ABNORMAL LOW (ref 8.9–10.3)
Chloride: 96 mmol/L — ABNORMAL LOW (ref 98–111)
Creatinine, Ser: 6.57 mg/dL — ABNORMAL HIGH (ref 0.61–1.24)
GFR, Estimated: 9 mL/min — ABNORMAL LOW (ref >60)
Glucose, Bld: 113 mg/dL — ABNORMAL HIGH (ref 70–99)
Potassium: 4.8 mmol/L (ref 3.5–5.1)
Sodium: 136 mmol/L (ref 135–145)
Total Bilirubin: 1.3 mg/dL — ABNORMAL HIGH (ref 0.3–1.2)
Total Protein: 5.6 g/dL — ABNORMAL LOW (ref 6.5–8.1)

## 2021-03-21 LAB — BLOOD GAS, VENOUS
Acid-Base Excess: 1.9 mmol/L (ref 0.0–2.0)
Bicarbonate: 26 mmol/L (ref 20.0–28.0)
FIO2: 21
O2 Saturation: 89.4 %
Patient temperature: 37
pCO2, Ven: 41.2 mmHg — ABNORMAL LOW (ref 44.0–60.0)
pH, Ven: 7.417 (ref 7.250–7.430)
pO2, Ven: 60.4 mmHg — ABNORMAL HIGH (ref 32.0–45.0)

## 2021-03-21 LAB — C-REACTIVE PROTEIN: CRP: 10.6 mg/dL — ABNORMAL HIGH (ref <1.0)

## 2021-03-21 LAB — D-DIMER, QUANTITATIVE: D-Dimer, Quant: 9.68 ug/mL-FEU — ABNORMAL HIGH (ref 0.00–0.50)

## 2021-03-21 LAB — MAGNESIUM: Magnesium: 2.3 mg/dL (ref 1.7–2.4)

## 2021-03-21 LAB — BRAIN NATRIURETIC PEPTIDE: B Natriuretic Peptide: 2887.5 pg/mL — ABNORMAL HIGH (ref 0.0–100.0)

## 2021-03-21 MED ORDER — MIDODRINE HCL 5 MG PO TABS
10.0000 mg | ORAL_TABLET | Freq: Three times a day (TID) | ORAL | Status: DC
  Administered 2021-03-21 – 2021-03-25 (×10): 10 mg via ORAL
  Filled 2021-03-21 (×10): qty 2

## 2021-03-21 MED ORDER — NALOXONE HCL 0.4 MG/ML IJ SOLN
0.2000 mg | Freq: Once | INTRAMUSCULAR | Status: AC
  Administered 2021-03-21: 0.2 mg via INTRAVENOUS
  Filled 2021-03-21: qty 1

## 2021-03-21 MED ORDER — FUROSEMIDE 10 MG/ML IJ SOLN
40.0000 mg | Freq: Once | INTRAMUSCULAR | Status: AC
  Administered 2021-03-21: 40 mg via INTRAVENOUS
  Filled 2021-03-21: qty 4

## 2021-03-21 MED ORDER — LACTATED RINGERS IV BOLUS
500.0000 mL | Freq: Once | INTRAVENOUS | Status: AC
  Administered 2021-03-21: 500 mL via INTRAVENOUS

## 2021-03-21 MED ORDER — NALOXONE HCL 0.4 MG/ML IJ SOLN: 0.4000 mg | INTRAMUSCULAR | Status: DC | PRN

## 2021-03-21 MED ORDER — METHYLPREDNISOLONE SODIUM SUCC 125 MG IJ SOLR
60.0000 mg | Freq: Two times a day (BID) | INTRAMUSCULAR | Status: DC
  Administered 2021-03-21 – 2021-03-22 (×2): 60 mg via INTRAVENOUS
  Filled 2021-03-21 (×2): qty 2

## 2021-03-21 MED ORDER — DILTIAZEM HCL 25 MG/5ML IV SOLN
10.0000 mg | Freq: Once | INTRAVENOUS | Status: DC
  Filled 2021-03-21: qty 5

## 2021-03-21 MED ORDER — METHYLPREDNISOLONE SODIUM SUCC 125 MG IJ SOLR
75.0000 mg | INTRAMUSCULAR | Status: DC
  Administered 2021-03-21: 75 mg via INTRAVENOUS
  Filled 2021-03-21: qty 2

## 2021-03-21 MED ORDER — LACTATED RINGERS IV BOLUS: 250.0000 mL | Freq: Once | INTRAVENOUS | Status: DC

## 2021-03-21 MED ORDER — IOHEXOL 350 MG/ML SOLN
75.0000 mL | Freq: Once | INTRAVENOUS | Status: AC | PRN
  Administered 2021-03-21: 75 mL via INTRAVENOUS

## 2021-03-21 MED ORDER — PREDNISONE 5 MG PO TABS: 50.0000 mg | ORAL_TABLET | Freq: Every day | ORAL | Status: DC

## 2021-03-21 NOTE — Progress Notes (Signed)
SLP Cancellation Note  Patient Details Name: Guenther Dunshee MRN: [id number] DOB: 1960/12/30   Cancelled treatment:       Reason Eval/Treat Not Completed: RN notified clinician that she will be administering Narcan this am and requested f/u for bedside swallow evaluation in the afternoon if possible. Will f/u as able.   Ellwood Dense, [place], [place] Office Number: [phone] Weekend Pager: [phone]  Acie Fredrickson 03/21/2021, 9:36 AM

## 2021-03-21 NOTE — Progress Notes (Signed)
[facility] Progress Note  Subjective: pt seen in room, no c/o   Vitals:   03/21/21 0005 03/21/21 0012 03/21/21 0556 03/21/21 0759  BP: 93/69 98/68 90/77  110/75  Pulse: 100 94  95  Resp: 20 18  17   Temp: (!) 96.5 F (35.8 C) (!) 97.4 F (36.3 C)  (!) 97 F (36.1 C)  TempSrc: Axillary Axillary  Axillary  SpO2: 97% 97%  97%  Weight:        Exam:  alert, nad   no jvd  Chest cta bilat  Cor reg no RG  Abd soft ntnd minimal ascites   Ext no LE edema   Alert, NF, ox3    R UE AVG+bruit    OP HD: [place] MWF   4h 450/1.5  69kg   2/2 bath  Hep none  R AVG Calcitriol 1.26mcg PO qHD-last dose 03/11/21 Sensipar 30mg  3x/weekly-last dose 03/09/21   Assessment/Plan:  COVID+ / borderline hypoxia - CXR looks like PNA and/ or edema. Being treated with decadron + actemra per primary team as well as cefepime and vanc. Had 2.6 L UF on HD yesterday. Over dry. O2 2L [place], improving.  ESRD - had HD here early yest on 9/09. MWF pt. Plan next HD on Monday.  HTN/volume - up 6kg by wts, not atypical for him w/ hx recurrent ascites.  Anemia of ESRD: not a major issue-  has not needed ESA as OP of late Metabolic Bone Disease: will order his calcitriol and sensipar as well as renvela when taking POs Altered MS-  HCT no obvious issue   Rob Ramatoulaye Pack 03/21/2021, 10:30 AM   Recent Labs  Lab 03/20/21 0630 03/21/21 0043  K 4.1 4.8  BUN 21* 34*  CREATININE 4.96* 6.57*  CALCIUM 8.5* 8.5*  PHOS 4.7*  --   HGB 9.8* 10.1*    Inpatient medications:  albuterol  2 puff Inhalation Q6H   Chlorhexidine Gluconate Cloth  6 each Topical Q0600   diltiazem  10 mg Intravenous Once   diltiazem  30 mg Oral Q6H   heparin  5,000 Units Subcutaneous Q8H   lactulose  20 g Oral Daily   methylPREDNISolone (SOLU-MEDROL) injection  75 mg Intravenous Q24H   Followed by   Derrill Memo ON 03/23/2021] predniSONE  50 mg Oral Daily   naLOXone (NARCAN)  injection  0.2 mg Intravenous Once   pantoprazole (PROTONIX) IV   40 mg Intravenous Q24H    sodium chloride     sodium chloride     ampicillin-sulbactam (UNASYN) IV 3 g (03/20/21 2136)   remdesivir 100 mg in NS 100 mL Stopped (03/20/21 1158)   sodium chloride, sodium chloride, alteplase, heparin, lidocaine (PF), lidocaine-prilocaine, metoprolol tartrate, naLOXone (NARCAN)  injection, pentafluoroprop-tetrafluoroeth

## 2021-03-21 NOTE — Plan of Care (Signed)
Pt will be able demonstrate understanding of care.

## 2021-03-21 NOTE — Progress Notes (Signed)
Pt bp 76/52, Attending contacted with concerns. Change of condition addressed. Will continue to monitor pt.

## 2021-03-21 NOTE — Progress Notes (Signed)
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 white matter. Multiple small chronic infarcts 3. No definite acute facial bone fracture identified. Bilateral maxillary sinus fluid levels without definitive  fracture 4. Considerable motion degradation of the cervical spine. No gross fracture seen 5. Extensive edema within the scalp soft tissues and right greater than left neck. Electronically Signed   By: Donavan Foil M.D.   On: 04/01/2021 18:55   IR Paracentesis  Result Date: 03/10/2021 INDICATION: Patient with history of end-stage renal disease, cirrhosis, hepatitis B/C with recurrent ascites. Request for therapeutic paracentesis EXAM: ULTRASOUND GUIDED THERAPEUTIC PARACENTESIS MEDICATIONS: Lidocaine 1% 10 mL COMPLICATIONS: None immediate. PROCEDURE: Informed written consent was obtained from the patient after a discussion of the risks, benefits and alternatives to treatment. A timeout was performed prior to the initiation of the procedure. Initial ultrasound scanning demonstrates a large amount of ascites within the right lower abdominal quadrant. The right lower abdomen was prepped and draped in the usual sterile fashion. 1% lidocaine was used for local anesthesia. Following this, a 19 gauge, 7-cm, Yueh catheter was introduced. An ultrasound image was saved for documentation purposes.  The paracentesis was performed. The catheter was removed and a dressing was applied. The patient tolerated the procedure well without immediate post procedural complication. FINDINGS: A total of approximately 4 L of serosanguineous fluid was removed. IMPRESSION: Successful ultrasound-guided therapeutic paracentesis yielding 4 liters of peritoneal fluid. Read by: Rushie Nyhan, NP Electronically Signed   By: Miachel Roux M.D.   On: 03/10/2021 13:40   IR Paracentesis  Result Date: 02/24/2021 INDICATION: History of ESRD, cirrhosis, hepatitis B and C with recurrent ascites. Request for therapeutic paracentesis up to 4 L. EXAM: ULTRASOUND GUIDED  PARACENTESIS MEDICATIONS: 10 mL 1% lidocaine COMPLICATIONS: None immediate. PROCEDURE: Informed written consent was obtained from the patient after a discussion of the risks, benefits  and alternatives to treatment. A timeout was performed prior to the initiation of the procedure. Initial ultrasound scanning demonstrates a large amount of ascites within the left lower abdominal quadrant. The left lower abdomen was prepped and draped in the usual sterile fashion. 1% lidocaine was used for local anesthesia. Following this, a 19 gauge, 7-cm, Yueh catheter was introduced. An ultrasound image was saved for documentation purposes. The paracentesis was performed. The catheter was removed and a dressing was applied. The patient tolerated the procedure well without immediate post procedural complication. FINDINGS: A total of approximately 3.7 L of hazy yellow fluid was removed. IMPRESSION: Successful ultrasound-guided paracentesis yielding 3.7 liters of peritoneal fluid. Read by: Durenda Guthrie, PA-C Electronically Signed   By: Miachel Roux M.D.   On: 02/24/2021 14:10

## 2021-03-21 NOTE — Progress Notes (Signed)
   03/21/21 2016  Vitals  Temp 98.8 F (37.1 C)  Temp Source Rectal  BP 111/78  MAP (mmHg) 88  BP Location Left Arm  BP Method Automatic  Patient Position (if appropriate) Lying  Pulse Rate (!) 117  Pulse Rate Source Monitor  Resp 20  Oxygen Therapy  SpO2 92 %  O2 Device Room Air  Pain Assessment  Pain Scale PAINAD  PAINAD (Pain Assessment in Advanced Dementia)  Breathing 0  Negative Vocalization 1  Facial Expression 0  Body Language 0  Consolability 0  PAINAD Score 1  PCA/Epidural/Spinal Assessment  Respiratory Pattern Regular;Unlabored  Neurological  Neuro (WDL) X  Level of Consciousness Alert  Orientation Level Oriented to person;Disoriented to place;Disoriented to time;Disoriented to situation  Cognition Impulsive;Poor attention/concentration;Poor judgement;Poor safety awareness  Speech Clear;Delayed responses  Pupil Assessment  Yes  R Pupil Size (mm) 3  R Pupil Shape Round  R Pupil Reaction Brisk  L Pupil Size (mm) 3  L Pupil Shape Round  L Pupil Reaction Brisk  Additional Pupil Assessments No  Motor Function/Sensation Assessment Motor response;Motor strength  RUE Motor Response Purposeful movement  RUE Motor Strength 4  LUE Motor Response Purposeful movement  LUE Motor Strength 4  RLE Motor Response Purposeful movement  RLE Motor Strength 3  LLE Motor Response Purposeful movement  LLE Motor Strength 3  Neuro Symptoms Forgetful  Neuro Additional Assessments No  Glasgow Coma Scale  Eye Opening 4  Best Verbal Response (NON-intubated) 4  Best Motor Response 6  Glasgow Coma Scale Score 14  Musculoskeletal  Musculoskeletal (WDL) X  Generalized Weakness Yes  Musculoskeletal Details  RUE Amputated fingers;Weakness  LUE Weakness;Other (Comment) (swelling to hand- present prior to fall)  RLE Weakness;Limited movement  LLE Weakness;Limited movement  Integumentary  Integumentary (WDL) X  Skin Color Appropriate for ethnicity  Skin Condition Dry  Skin  Integrity Intact  Skin Turgor Non-tenting

## 2021-03-21 NOTE — Progress Notes (Signed)
   03/21/21 2015  What Happened  Was fall witnessed? No  Was patient injured? Unsure  Patient found on floor  Found by Staff-comment (Primary RN- Matt)  Stated prior activity other (comment) (Unknown)  Follow Up  MD notified Chotiner MD  Time MD notified 2015  Additional tests No (MD did not place orders)  Simple treatment Other (comment) (Bathed, Repositioned, Reoriented)  Progress note created (see row info) Yes  Adult Fall Risk Assessment  Risk Factor Category (scoring not indicated) Fall has occurred during this admission (document High fall risk)  Age 60  Fall History: Fall within 6 months prior to admission 0  Elimination; Bowel and/or Urine Incontinence 2  Elimination; Bowel and/or Urine Urgency/Frequency 0  Medications: includes PCA/Opiates, Anti-convulsants, Anti-hypertensives, Diuretics, Hypnotics, Laxatives, Sedatives, and Psychotropics 3  Patient Care Equipment 2  Mobility-Assistance 2  Mobility-Gait 2  Mobility-Sensory Deficit 2  Altered awareness of immediate physical environment 1  Impulsiveness 2  Lack of understanding of one's physical/cognitive limitations 4  Total Score 20  Patient Fall Risk Level High fall risk  Adult Fall Risk Interventions  Required Bundle Interventions *See Row Information* High fall risk - low, moderate, and high requirements implemented  Additional Interventions Reorient/diversional activities with confused patients;Use of appropriate toileting equipment (bedpan, BSC, etc.);Pharmacy review of medications  Screening for Fall Injury Risk (To be completed on HIGH fall risk patients) - Assessing Need for Floor Mats  Risk For Fall Injury- Criteria for Floor Mats Previous fall this admission  Will Implement Floor Mats Yes    03/21/21 2015  What Happened  Was fall witnessed? No  Was patient injured? Unsure  Patient found on floor  Found by Staff-comment (Primary RN- Matt)  Stated prior activity other (comment) (Unknown)  Follow Up   MD notified Chotiner MD  Time MD notified 2015  Additional tests No (MD did not place orders)  Simple treatment Other (comment) (Bathed, Repositioned, Reoriented)  Progress note created (see row info) Yes  Adult Fall Risk Assessment  Risk Factor Category (scoring not indicated) Fall has occurred during this admission (document High fall risk)  Age 60  Fall History: Fall within 6 months prior to admission 0  Elimination; Bowel and/or Urine Incontinence 2  Elimination; Bowel and/or Urine Urgency/Frequency 0  Medications: includes PCA/Opiates, Anti-convulsants, Anti-hypertensives, Diuretics, Hypnotics, Laxatives, Sedatives, and Psychotropics 3  Patient Care Equipment 2  Mobility-Assistance 2  Mobility-Gait 2  Mobility-Sensory Deficit 2  Altered awareness of immediate physical environment 1  Impulsiveness 2  Lack of understanding of one's physical/cognitive limitations 4  Total Score 20  Patient Fall Risk Level High fall risk  Adult Fall Risk Interventions  Required Bundle Interventions *See Row Information* High fall risk - low, moderate, and high requirements implemented  Additional Interventions Reorient/diversional activities with confused patients;Use of appropriate toileting equipment (bedpan, BSC, etc.);Pharmacy review of medications  Screening for Fall Injury Risk (To be completed on HIGH fall risk patients) - Assessing Need for Floor Mats  Risk For Fall Injury- Criteria for Floor Mats Previous fall this admission  Will Implement Floor Mats Yes

## 2021-03-21 NOTE — Progress Notes (Signed)
Notified by RN that pt has irregular breathing pattern. He is confused also per RN. He has diminished breath sounds with coarse rales. Is on oxygen by n/c at 2 L/min. CXR obtained and shows enlarged heart with increased interstitial markings.  Pt refused to have ABG obtained.   Check VBG, BNP. Given lasix 40 mg IV now.  Monitor closely.

## 2021-03-22 DIAGNOSIS — G9340 Encephalopathy, unspecified: Secondary | ICD-10-CM | POA: Diagnosis not present

## 2021-03-22 LAB — CBC WITH DIFFERENTIAL/PLATELET
Abs Immature Granulocytes: 0.05 10*3/uL (ref 0.00–0.07)
Basophils Absolute: 0 10*3/uL (ref 0.0–0.1)
Basophils Relative: 0 %
Eosinophils Absolute: 0 10*3/uL (ref 0.0–0.5)
Eosinophils Relative: 0 %
HCT: 32.1 % — ABNORMAL LOW (ref 39.0–52.0)
Hemoglobin: 10.9 g/dL — ABNORMAL LOW (ref 13.0–17.0)
Immature Granulocytes: 1 %
Lymphocytes Relative: 7 %
Lymphs Abs: 0.7 10*3/uL (ref 0.7–4.0)
MCH: 26.5 pg (ref 26.0–34.0)
MCHC: 34 g/dL (ref 30.0–36.0)
MCV: 77.9 fL — ABNORMAL LOW (ref 80.0–100.0)
Monocytes Absolute: 0.4 10*3/uL (ref 0.1–1.0)
Monocytes Relative: 4 %
Neutro Abs: 8.6 10*3/uL — ABNORMAL HIGH (ref 1.7–7.7)
Neutrophils Relative %: 88 %
Platelets: 285 10*3/uL (ref 150–400)
RBC: 4.12 MIL/uL — ABNORMAL LOW (ref 4.22–5.81)
RDW: 14.8 % (ref 11.5–15.5)
WBC: 9.8 10*3/uL (ref 4.0–10.5)
nRBC: 0.2 % (ref 0.0–0.2)

## 2021-03-22 LAB — COMPREHENSIVE METABOLIC PANEL WITH GFR
ALT: 22 U/L (ref 0–44)
AST: 28 U/L (ref 15–41)
Albumin: 2.8 g/dL — ABNORMAL LOW (ref 3.5–5.0)
Alkaline Phosphatase: 166 U/L — ABNORMAL HIGH (ref 38–126)
Anion gap: 16 — ABNORMAL HIGH (ref 5–15)
BUN: 51 mg/dL — ABNORMAL HIGH (ref 6–20)
CO2: 23 mmol/L (ref 22–32)
Calcium: 8.5 mg/dL — ABNORMAL LOW (ref 8.9–10.3)
Chloride: 98 mmol/L (ref 98–111)
Creatinine, Ser: 7.47 mg/dL — ABNORMAL HIGH (ref 0.61–1.24)
GFR, Estimated: 8 mL/min — ABNORMAL LOW
Glucose, Bld: 139 mg/dL — ABNORMAL HIGH (ref 70–99)
Potassium: 4.8 mmol/L (ref 3.5–5.1)
Sodium: 137 mmol/L (ref 135–145)
Total Bilirubin: 1 mg/dL (ref 0.3–1.2)
Total Protein: 6.2 g/dL — ABNORMAL LOW (ref 6.5–8.1)

## 2021-03-22 LAB — D-DIMER, QUANTITATIVE: D-Dimer, Quant: 19.37 ug{FEU}/mL — ABNORMAL HIGH (ref 0.00–0.50)

## 2021-03-22 LAB — C-REACTIVE PROTEIN: CRP: 7.7 mg/dL — ABNORMAL HIGH

## 2021-03-22 LAB — MAGNESIUM: Magnesium: 2.3 mg/dL (ref 1.7–2.4)

## 2021-03-22 MED ORDER — METHYLPREDNISOLONE SODIUM SUCC 40 MG IJ SOLR
40.0000 mg | Freq: Two times a day (BID) | INTRAMUSCULAR | Status: AC
  Administered 2021-03-22 – 2021-03-23 (×2): 40 mg via INTRAVENOUS
  Filled 2021-03-22 (×2): qty 1

## 2021-03-22 MED ORDER — POLYETHYLENE GLYCOL 3350 17 G PO PACK
17.0000 g | PACK | Freq: Every day | ORAL | Status: DC | PRN
  Administered 2021-03-24: 17 g via ORAL
  Filled 2021-03-22: qty 1

## 2021-03-22 MED ORDER — MAGNESIUM HYDROXIDE 400 MG/5ML PO SUSP
30.0000 mL | Freq: Two times a day (BID) | ORAL | Status: AC
  Administered 2021-03-22: 30 mL via ORAL
  Filled 2021-03-22 (×2): qty 30

## 2021-03-22 MED ORDER — SEVELAMER CARBONATE 800 MG PO TABS: 1600.0000 mg | ORAL_TABLET | Freq: Three times a day (TID) | ORAL | Status: DC | PRN

## 2021-03-22 MED ORDER — AMIODARONE HCL 200 MG PO TABS
200.0000 mg | ORAL_TABLET | Freq: Every morning | ORAL | Status: DC
  Administered 2021-03-22 – 2021-03-25 (×4): 200 mg via ORAL
  Filled 2021-03-22 (×4): qty 1

## 2021-03-22 MED ORDER — SEVELAMER CARBONATE 800 MG PO TABS
3200.0000 mg | ORAL_TABLET | Freq: Three times a day (TID) | ORAL | Status: DC
  Administered 2021-03-22 – 2021-03-25 (×8): 3200 mg via ORAL
  Filled 2021-03-22 (×9): qty 4

## 2021-03-22 MED ORDER — HALOPERIDOL LACTATE 5 MG/ML IJ SOLN
2.0000 mg | Freq: Three times a day (TID) | INTRAMUSCULAR | Status: DC | PRN
  Administered 2021-03-22 – 2021-03-23 (×2): 2 mg via INTRAVENOUS
  Filled 2021-03-22 (×2): qty 1

## 2021-03-22 MED ORDER — CINACALCET HCL 30 MG PO TABS
60.0000 mg | ORAL_TABLET | Freq: Two times a day (BID) | ORAL | Status: DC
  Administered 2021-03-22 – 2021-03-25 (×6): 60 mg via ORAL
  Filled 2021-03-22 (×6): qty 2

## 2021-03-22 MED ORDER — PANTOPRAZOLE SODIUM 40 MG PO TBEC
40.0000 mg | DELAYED_RELEASE_TABLET | Freq: Every day | ORAL | Status: DC
  Administered 2021-03-23 – 2021-03-25 (×3): 40 mg via ORAL
  Filled 2021-03-22 (×3): qty 1

## 2021-03-22 NOTE — Progress Notes (Addendum)
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 the right sub occipital region and right greater than left neck. IMPRESSION: 1. Limited by motion  degradation 2. No definite CT evidence for acute intracranial abnormality. Mild atrophy and chronic small vessel ischemic change of the white matter. Multiple small chronic infarcts 3. No definite acute facial bone fracture identified. Bilateral maxillary sinus fluid levels without definitive fracture 4. Considerable motion degradation of the cervical spine. No gross fracture seen 5. Extensive edema within the scalp soft tissues and right greater than left neck. Electronically Signed   By: Donavan Foil M.D.   On: 03/31/2021 18:55   IR Paracentesis  Result Date: 03/10/2021 INDICATION: Patient with history of end-stage renal disease, cirrhosis, hepatitis B/C with recurrent ascites. Request for therapeutic paracentesis EXAM: ULTRASOUND GUIDED THERAPEUTIC PARACENTESIS MEDICATIONS: Lidocaine 1% 10 mL COMPLICATIONS: None immediate. PROCEDURE: Informed written consent was obtained from the patient after a discussion of the risks, benefits and alternatives to treatment. A timeout was performed prior to the initiation of the procedure. Initial ultrasound scanning demonstrates a large amount of ascites within the right lower abdominal quadrant. The right lower abdomen was prepped and draped in the usual sterile fashion. 1% lidocaine was used for local anesthesia. Following this, a 19 gauge, 7-cm, Yueh catheter was introduced. An ultrasound image was saved for documentation purposes. The paracentesis was performed. The catheter was removed and a dressing was applied. The patient tolerated the procedure well without immediate post procedural complication. FINDINGS: A total of approximately 4 L of serosanguineous fluid was removed. IMPRESSION: Successful ultrasound-guided therapeutic paracentesis yielding 4 liters of peritoneal fluid. Read by: Rushie Nyhan, NP Electronically Signed   By:  Sharen Heck  Mir M.D.   On: 03/10/2021 13:40   IR Paracentesis  Result Date: 02/24/2021 INDICATION: History of ESRD, cirrhosis,  hepatitis B and C with recurrent ascites. Request for therapeutic paracentesis up to 4 L. EXAM: ULTRASOUND GUIDED  PARACENTESIS MEDICATIONS: 10 mL 1% lidocaine COMPLICATIONS: None immediate. PROCEDURE: Informed written consent was obtained from the patient after a discussion of the risks, benefits and alternatives to treatment. A timeout was performed prior to the initiation of the procedure. Initial ultrasound scanning demonstrates a large amount of ascites within the left lower abdominal quadrant. The left lower abdomen was prepped and draped in the usual sterile fashion. 1% lidocaine was used for local anesthesia. Following this, a 19 gauge, 7-cm, Yueh catheter was introduced. An ultrasound image was saved for documentation purposes. The paracentesis was performed. The catheter was removed and a dressing was applied. The patient tolerated the procedure well without immediate post procedural complication. FINDINGS: A total of approximately 3.7 L of hazy yellow fluid was removed. IMPRESSION: Successful ultrasound-guided paracentesis yielding 3.7 liters of peritoneal fluid. Read by: Durenda Guthrie, PA-C Electronically Signed   By: Miachel Roux M.D.   On: 02/24/2021 14:10

## 2021-03-22 NOTE — Progress Notes (Signed)
[facility] Progress Note  Subjective: pt seen in room, no c/o   Vitals:   03/22/21 0030 03/22/21 0430 03/22/21 0827 03/22/21 1146  BP: 109/64 107/89 119/72 109/89  Pulse: 99 99  95  Resp: 18 16    Temp: 98.4 F (36.9 C) 98.8 F (37.1 C) 97.7 F (36.5 C) 97.7 F (36.5 C)  TempSrc: Oral Oral Axillary Axillary  SpO2: 93% 93%  100%  Weight:        Exam:  alert, nad   no jvd, dry mouth  Chest cta bilat  Cor reg no RG  Abd soft ntnd minimal ascites   Ext no LE edema   Alert, NF, ox3    R UE AVG+bruit    [facility] HD: [place] M[facility]   4h 450/1.5  69kg   2/2 bath  Hep none  R AVG Calcitriol 1.85mcg PO qHD-last dose 03/11/21 Sensipar 30mg  3x/weekly-last dose 03/09/21   Assessment/Plan:  COVID+ / borderline hypoxia - CXR looks like PNA and/ or edema. Being treated with decadron + actemra per primary team as well as cefepime and vanc.  BP - chronic soft BP's, on amio / Cardizem for afib rate control, and on midodrine 10 tid. Usually tolerates UF on HD.  Volume - had 2.6 L UF on Friday. Over dry wt now by 4- 5kg, which is pretty close to baseline for him. Some of the extra wt is recurrent ascites. O2 2L [place]. ESRD - had HD here early on 9/09. MWF pt. Plan HD on Monday.  Anemia of ESRD: not a major issue-  has not needed ESA as OP of late Metabolic Bone Disease: will order his calcitriol and sensipar as well as renvela when taking POs Altered MS-  HCT no obvious issue   Rob Dennison Mcdaid 03/22/2021, 1:34 PM   Recent Labs  Lab 03/20/21 0630 03/21/21 0043 03/22/21 0052  K 4.1 4.8 4.8  BUN 21* 34* 51*  CREATININE 4.96* 6.57* 7.47*  CALCIUM 8.5* 8.5* 8.5*  PHOS 4.7*  --   --   HGB 9.8* 10.1* 10.9*    Inpatient medications:  albuterol  2 puff Inhalation Q6H   amiodarone  200 mg Oral q AM   Chlorhexidine Gluconate Cloth  6 each Topical Q0600   cinacalcet  60 mg Oral BID WC   diltiazem  10 mg Intravenous Once   diltiazem  30 mg Oral Q6H   heparin  5,000 Units Subcutaneous  Q8H   lactulose  20 g Oral Daily   magnesium hydroxide  30 mL Oral BID   methylPREDNISolone (SOLU-MEDROL) injection  40 mg Intravenous Q12H   midodrine  10 mg Oral Q8H   pantoprazole (PROTONIX) IV  40 mg Intravenous Q24H   sevelamer carbonate  3,200 mg Oral TID WC    sodium chloride     sodium chloride     ampicillin-sulbactam (UNASYN) IV 3 g (03/22/21 1008)   remdesivir 100 mg in NS 100 mL 100 mg (03/22/21 1014)   sodium chloride, sodium chloride, alteplase, heparin, lidocaine (PF), lidocaine-prilocaine, metoprolol tartrate, naLOXone (NARCAN)  injection, pentafluoroprop-tetrafluoroeth, polyethylene glycol, sevelamer carbonate

## 2021-03-23 ENCOUNTER — Inpatient Hospital Stay (HOSPITAL_COMMUNITY): Payer: Medicare Other

## 2021-03-23 DIAGNOSIS — G9340 Encephalopathy, unspecified: Secondary | ICD-10-CM | POA: Diagnosis not present

## 2021-03-23 LAB — CBC WITH DIFFERENTIAL/PLATELET
Abs Immature Granulocytes: 0.11 10*3/uL — ABNORMAL HIGH (ref 0.00–0.07)
Basophils Absolute: 0 10*3/uL (ref 0.0–0.1)
Basophils Relative: 0 %
Eosinophils Absolute: 0 10*3/uL (ref 0.0–0.5)
Eosinophils Relative: 0 %
HCT: 30 % — ABNORMAL LOW (ref 39.0–52.0)
Hemoglobin: 10.6 g/dL — ABNORMAL LOW (ref 13.0–17.0)
Immature Granulocytes: 1 %
Lymphocytes Relative: 6 %
Lymphs Abs: 0.7 10*3/uL (ref 0.7–4.0)
MCH: 27.2 pg (ref 26.0–34.0)
MCHC: 35.3 g/dL (ref 30.0–36.0)
MCV: 77.1 fL — ABNORMAL LOW (ref 80.0–100.0)
Monocytes Absolute: 1.1 10*3/uL — ABNORMAL HIGH (ref 0.1–1.0)
Monocytes Relative: 9 %
Neutro Abs: 10.7 10*3/uL — ABNORMAL HIGH (ref 1.7–7.7)
Neutrophils Relative %: 84 %
Platelets: 253 10*3/uL (ref 150–400)
RBC: 3.89 MIL/uL — ABNORMAL LOW (ref 4.22–5.81)
RDW: 15.1 % (ref 11.5–15.5)
WBC: 12.7 10*3/uL — ABNORMAL HIGH (ref 4.0–10.5)
nRBC: 0.2 % (ref 0.0–0.2)

## 2021-03-23 LAB — COMPREHENSIVE METABOLIC PANEL
ALT: 19 U/L (ref 0–44)
AST: 23 U/L (ref 15–41)
Albumin: 3.2 g/dL — ABNORMAL LOW (ref 3.5–5.0)
Alkaline Phosphatase: 159 U/L — ABNORMAL HIGH (ref 38–126)
Anion gap: 13 (ref 5–15)
BUN: 8 mg/dL (ref 6–20)
CO2: 31 mmol/L (ref 22–32)
Calcium: 8.1 mg/dL — ABNORMAL LOW (ref 8.9–10.3)
Chloride: 92 mmol/L — ABNORMAL LOW (ref 98–111)
Creatinine, Ser: 2.15 mg/dL — ABNORMAL HIGH (ref 0.61–1.24)
GFR, Estimated: 35 mL/min — ABNORMAL LOW (ref >60)
Glucose, Bld: 100 mg/dL — ABNORMAL HIGH (ref 70–99)
Potassium: 2 mmol/L — CL (ref 3.5–5.1)
Sodium: 136 mmol/L (ref 135–145)
Total Bilirubin: 0.7 mg/dL (ref 0.3–1.2)
Total Protein: 6.7 g/dL (ref 6.5–8.1)

## 2021-03-23 LAB — C-REACTIVE PROTEIN: CRP: 5.6 mg/dL — ABNORMAL HIGH (ref <1.0)

## 2021-03-23 LAB — POTASSIUM: Potassium: 3.7 mmol/L (ref 3.5–5.1)

## 2021-03-23 LAB — D-DIMER, QUANTITATIVE: D-Dimer, Quant: >20 ug/mL-FEU — ABNORMAL HIGH (ref 0.00–0.50)

## 2021-03-23 LAB — MAGNESIUM: Magnesium: 1.9 mg/dL (ref 1.7–2.4)

## 2021-03-23 MED ORDER — CALCITRIOL 0.25 MCG PO CAPS
1.5000 ug | ORAL_CAPSULE | Freq: Every day | ORAL | Status: DC
  Administered 2021-03-23 – 2021-03-25 (×3): 1.5 ug via ORAL
  Filled 2021-03-23 (×3): qty 6

## 2021-03-23 MED ORDER — ACETAMINOPHEN 500 MG PO TABS
500.0000 mg | ORAL_TABLET | Freq: Three times a day (TID) | ORAL | Status: DC | PRN
  Administered 2021-03-24 – 2021-03-25 (×2): 500 mg via ORAL
  Filled 2021-03-23 (×2): qty 1

## 2021-03-23 NOTE — Progress Notes (Addendum)
Pt transported for barium swallow study

## 2021-03-23 NOTE — Progress Notes (Signed)
Pt transported to dialysis

## 2021-03-23 NOTE — Progress Notes (Signed)
[facility] Progress Note  Subjective:  he had on been on 2 liters prior to getting up to bedside commode and now on NRB.  He's not yet had HD today.   Review of systems:  Denies overt shortness of breath  Denies n/v Wants his pain medication for chronic pain     Vitals:   03/22/21 2005 03/23/21 0015 03/23/21 0608 03/23/21 0808  BP: 115/63 95/81 (!) 97/59 137/89  Pulse:   100   Resp:  20 20   Temp: 97.6 F (36.4 C) 97.6 F (36.4 C) 98 F (36.7 C) 97.9 F (36.6 C)  TempSrc: Axillary Axillary Axillary Axillary  SpO2:   95%   Weight:   75.8 kg     Exam:  General adult male in chair on NRB  HEENT normocephalic atraumatic  Neck supple trachea midline Lungs normal work of breathing at rest but inc with exertion Heart regular rate and rhythm no rubs or gallops appreciated Abdomen soft nontender somewhat distended Extremities no edema  Psych normal mood and affect Access:   R UE AVG+bruit    [facility] HD: [place] [facility]   4h 450/1.5  69kg   2/2 bath  Hep none  R AVG Calcitriol 1.60mcg PO qHD-last dose 03/11/21 Sensipar 30mg  3x/weekly-last dose 03/09/21   Assessment/Plan:  COVID+ - CXR looks like PNA and/ or edema. Being treated with decadron + actemra per primary team as well as cefepime and vanc.  BP - chronic soft BP's, on amio / Cardizem for afib rate control, and on midodrine 10 tid. Optimize volume with HD Acute hypoxic resp. Failure - secondary to covid and overload.  Some of the extra wt is recurrent ascites. Oxygen support as needed. Optimize volume with HD ESRD - HD today per MWF schedule   Anemia of ESRD: no indication for ESA here and has not needed ESA as OP of late Metabolic Bone Disease: will order his calcitriol; on sensipar as well as renvela Altered MS-  HCT no obvious issue  Afib - on amio and dilt per primary    Recent Labs  Lab 03/20/21 0630 03/21/21 0043 03/22/21 0052  K 4.1 4.8 4.8  BUN 21* 34* 51*  CREATININE 4.96* 6.57* 7.47*  CALCIUM  8.5* 8.5* 8.5*  PHOS 4.7*  --   --   HGB 9.8* 10.1* 10.9*   Inpatient medications:  albuterol  2 puff Inhalation Q6H   amiodarone  200 mg Oral q AM   Chlorhexidine Gluconate Cloth  6 each Topical Q0600   cinacalcet  60 mg Oral BID WC   diltiazem  10 mg Intravenous Once   diltiazem  30 mg Oral Q6H   heparin  5,000 Units Subcutaneous Q8H   lactulose  20 g Oral Daily   methylPREDNISolone (SOLU-MEDROL) injection  40 mg Intravenous Q12H   midodrine  10 mg Oral Q8H   pantoprazole  40 mg Oral Q1200   sevelamer carbonate  3,200 mg Oral TID WC    sodium chloride     sodium chloride     ampicillin-sulbactam (UNASYN) IV 3 g (03/23/21 0857)   remdesivir 100 mg in NS 100 mL 100 mg (03/22/21 1014)   sodium chloride, sodium chloride, alteplase, haloperidol lactate, heparin, lidocaine (PF), lidocaine-prilocaine, metoprolol tartrate, naLOXone (NARCAN)  injection, pentafluoroprop-tetrafluoroeth, polyethylene glycol, sevelamer carbonate   Claudia Desanctis, MD 11:28 AM 03/23/2021

## 2021-03-23 NOTE — Plan of Care (Signed)

## 2021-03-23 NOTE — Progress Notes (Signed)
Back on unit from dialysis

## 2021-03-23 NOTE — Progress Notes (Signed)
Pt returned from barium swallow with swallowing parameters.

## 2021-03-23 NOTE — Progress Notes (Signed)
Modified Barium Swallow Progress Note  Patient Details  Name: [name] MRN: [id number] Date of Birth: 23-Sep-1960  Today's Date: 03/23/2021  Modified Barium Swallow completed.  Full report located under Chart Review in the Imaging Section.  Brief recommendations include the following:  Clinical Impression  Pt presents with oropharyngeal dysphagia characterized by mildly prolonged mastication, reduced bolus cohesion, reduced lingual retraction, reduced anterior laryngeal movement, and a pharyngeal delay. He demonstrated premature spillage to the valleculae with spillover to the pyriform sinuses, vallecular residue, and pyriform sinus residue. Amount of pharyngeal residue increased with bolus size and consistency. Residue was cleared with a liquid wash, but aspiration (PAS 7) was noted once when consecutive swallows of thin liquids via straw were used to clear pyriform sinus residue. Aspiration triggered coughing which was ineffective in expelling the aspirate. Cricopharyngeal bar was intermittently noted with reduced cricopharyngeal relaxation and some backflow of barium superior to the UES. This was not observed during the study; however, considering pt's frequent throat clearing noted during the evaluation this morning, SLP questions intermittent post-prandial aspiration. A dysphagia 3 diet with thin liquids is recommended at this time with observance of swallowing precautions. SLP will follow briefly assess tolerance.   Swallow Evaluation Recommendations       SLP Diet Recommendations: Dysphagia 3 (Mech soft) solids;Thin liquid   Liquid Administration via: Cup;No straw   Medication Administration: Whole meds with puree   Supervision: Staff to assist with self feeding   Compensations: Slow rate;Small sips/bites;Minimize environmental distractions;Follow solids with liquid;Multiple dry swallows after each bite/sip   Postural Changes: Seated upright at 90 degrees   Oral Care  Recommendations: Oral care BID      [name], [name], [name] Office number [phone] Pager [phone]  Horton Marshall 03/23/2021,3:57 PM

## 2021-03-23 NOTE — Progress Notes (Signed)
Sbar provided to on coming nurse, jpt currently in dialysis, critical potassium of 2.0 reported to on coming nurse, hospitalist and nephrologist.

## 2021-03-23 NOTE — Evaluation (Signed)
Clinical/Bedside Swallow Evaluation Patient Details  Name: [name] MRN: [id number] Date of Birth: [birth year]  Today's Date: 03/23/2021 Time: SLP Start Time (ACUTE ONLY): 0936 SLP Stop Time (ACUTE ONLY): 0956 SLP Time Calculation (min) (ACUTE ONLY): 20 min  Past Medical History:  Past Medical History:  Diagnosis Date   Anemia    Anxiety    Arthritis    left shoulder   Arthritis    Atherosclerosis of aorta (HCC)    Cardiomegaly    Chest pain    DATE UNKNOWN, C/O PERIODICALLY   Chest pain    Cocaine abuse (HCC)    COPD (chronic obstructive pulmonary disease) (HCC)    COPD exacerbation ([place]) 08/17/2016   Coronary artery disease    stent 02/22/17   Coronary artery disease    Dysrhythmia    ESRD (end stage renal disease) ([place])    ESRD (end stage renal disease) on dialysis ([place])    "[name]; M-W-F" (07/04/2017)   GERD (gastroesophageal reflux disease)    DATE UNKNOWN   GERD (gastroesophageal reflux disease)    Hemorrhoids    Hepatitis B    Hepatitis B, chronic (HCC)    Hepatitis C    History of kidney stones    Hyperkalemia    Hypertension    Kidney failure    Metabolic bone disease    Patient denies   Mitral stenosis    Myocardial infarction [facility])    Pneumonia    Pulmonary edema    Renal disorder    Solitary rectal ulcer syndrome 07/2017   at flex sig for rectal bleeding   Solitary rectal ulcer syndrome    Tubular adenoma of colon    Past Surgical History:  Past Surgical History:  Procedure Laterality Date   A/V FISTULAGRAM Left 05/26/2017   Procedure: A/V FISTULAGRAM;  Surgeon: Conrad Le Roy, MD;  Location: [place] [facility];  Service: Cardiovascular;  Laterality: Left;   A/V FISTULAGRAM Right 11/18/2017   Procedure: A/V FISTULAGRAM - Right Arm;  Surgeon: Elam Dutch, MD;  Location: [place] [facility];  Service: Cardiovascular;  Laterality: Right;   AMPUTATION Right 09/04/2020   Procedure: RIGHT INDEX AND RIGHT RING FINGER AMPUTATION DIGIT;   Surgeon: Leanora Cover, MD;  Location: [place];  Service: Orthopedics;  Laterality: Right;   AMPUTATION Right 11/13/2020   Procedure: RIGHT INDEX FINGER AMPUTATION AND RIGHT RING FINGER AMPUTATION;  Surgeon: Leanora Cover, MD;  Location: [place];  Service: Orthopedics;  Laterality: Right;   APPLICATION OF WOUND VAC Left 06/14/2017   Procedure: APPLICATION OF WOUND VAC;  Surgeon: Katha Cabal, MD;  Location: [facility];  Service: Vascular;  Laterality: Left;   AV FISTULA PLACEMENT  2012   BELIEVED WAS PLACED IN JUNE   AV FISTULA PLACEMENT Right 08/09/2017   Procedure: Creation Right arm ARTERIOVENOUS BRACHIOCEPOHALIC FISTULA;  Surgeon: Elam Dutch, MD;  Location: [facility] OR;  Service: Vascular;  Laterality: Right;   AV FISTULA PLACEMENT Right 11/22/2017   Procedure: INSERTION OF ARTERIOVENOUS (AV) GORE-TEX GRAFT RIGHT UPPER ARM;  Surgeon: Elam Dutch, MD;  Location: [place];  Service: Vascular;  Laterality: Right;   BIOPSY  01/25/2018   Procedure: BIOPSY;  Surgeon: Jerene Bears, MD;  Location: [place];  Service: Gastroenterology;;   BIOPSY  04/10/2019   Procedure: BIOPSY;  Surgeon: Jerene Bears, MD;  Location: [facility];  Service: Gastroenterology;;   COLONOSCOPY     COLONOSCOPY WITH PROPOFOL N/A 01/25/2018   Procedure: COLONOSCOPY WITH  PROPOFOL;  Surgeon: Jerene Bears, MD;  Location: [place];  Service: Gastroenterology;  Laterality: N/A;   CORONARY STENT INTERVENTION N/A 02/22/2017   Procedure: CORONARY STENT INTERVENTION;  Surgeon: Nigel Mormon, MD;  Location: [address] [facility];  Service: Cardiovascular;  Laterality: N/A;   ESOPHAGOGASTRODUODENOSCOPY (EGD) WITH PROPOFOL N/A 01/25/2018   Procedure: ESOPHAGOGASTRODUODENOSCOPY (EGD) WITH PROPOFOL;  Surgeon: Jerene Bears, MD;  Location: [place];  Service: Gastroenterology;  Laterality: N/A;   ESOPHAGOGASTRODUODENOSCOPY (EGD) WITH PROPOFOL N/A 04/10/2019   Procedure: ESOPHAGOGASTRODUODENOSCOPY (EGD) WITH PROPOFOL;  Surgeon:  Jerene Bears, MD;  Location: [facility];  Service: Gastroenterology;  Laterality: N/A;   FLEXIBLE SIGMOIDOSCOPY N/A 07/15/2017   Procedure: FLEXIBLE SIGMOIDOSCOPY;  Surgeon: Carol Ada, MD;  Location: [place];  Service: Endoscopy;  Laterality: N/A;   HEMORRHOID BANDING     I & D EXTREMITY Left 06/01/2017   Procedure: IRRIGATION AND DEBRIDEMENT LEFT ARM HEMATOMA WITH LIGATION OF LEFT ARM AV FISTULA;  Surgeon: Elam Dutch, MD;  Location: [place];  Service: Vascular;  Laterality: Left;   I & D EXTREMITY Left 06/14/2017   Procedure: IRRIGATION AND DEBRIDEMENT EXTREMITY;  Surgeon: Katha Cabal, MD;  Location: [facility];  Service: Vascular;  Laterality: Left;   INSERTION OF DIALYSIS CATHETER  05/30/2017   INSERTION OF DIALYSIS CATHETER N/A 05/30/2017   Procedure: INSERTION OF DIALYSIS CATHETER;  Surgeon: Elam Dutch, MD;  Location: [place];  Service: Vascular;  Laterality: N/A;   IR PARACENTESIS  08/30/2017   IR PARACENTESIS  09/29/2017   IR PARACENTESIS  10/28/2017   IR PARACENTESIS  11/09/2017   IR PARACENTESIS  11/16/2017   IR PARACENTESIS  11/28/2017   IR PARACENTESIS  12/01/2017   IR PARACENTESIS  12/06/2017   IR PARACENTESIS  01/03/2018   IR PARACENTESIS  01/23/2018   IR PARACENTESIS  02/07/2018   IR PARACENTESIS  02/21/2018   IR PARACENTESIS  03/06/2018   IR PARACENTESIS  03/17/2018   IR PARACENTESIS  04/04/2018   IR PARACENTESIS  12/28/2018   IR PARACENTESIS  01/08/2019   IR PARACENTESIS  01/23/2019   IR PARACENTESIS  02/01/2019   IR PARACENTESIS  02/19/2019   IR PARACENTESIS  03/01/2019   IR PARACENTESIS  03/15/2019   IR PARACENTESIS  04/03/2019   IR PARACENTESIS  04/12/2019   IR PARACENTESIS  05/01/2019   IR PARACENTESIS  05/08/2019   IR PARACENTESIS  05/24/2019   IR PARACENTESIS  06/12/2019   IR PARACENTESIS  07/09/2019   IR PARACENTESIS  07/27/2019   IR PARACENTESIS  08/09/2019   IR PARACENTESIS  08/21/2019   IR PARACENTESIS  09/17/2019   IR PARACENTESIS  10/05/2019   IR  PARACENTESIS  10/29/2019   IR PARACENTESIS  11/08/2019   IR PARACENTESIS  12/12/2019   IR PARACENTESIS  01/03/2020   IR PARACENTESIS  01/10/2020   IR PARACENTESIS  01/17/2020   IR PARACENTESIS  01/24/2020   IR PARACENTESIS  01/31/2020   IR PARACENTESIS  02/07/2020   IR PARACENTESIS  02/21/2020   IR PARACENTESIS  05/21/2020   IR PARACENTESIS  10/10/2020   IR PARACENTESIS  10/28/2020   IR PARACENTESIS  11/18/2020   IR PARACENTESIS  12/02/2020   IR PARACENTESIS  12/11/2020   IR PARACENTESIS  12/18/2020   IR PARACENTESIS  12/30/2020   IR PARACENTESIS  01/06/2021   IR PARACENTESIS  01/13/2021   IR PARACENTESIS  01/20/2021   IR PARACENTESIS  01/27/2021   IR PARACENTESIS  02/03/2021   IR PARACENTESIS  02/10/2021   IR PARACENTESIS  02/17/2021   IR PARACENTESIS  02/24/2021   IR PARACENTESIS  03/10/2021   IR RADIOLOGIST EVAL & MGMT  02/14/2018   IR RADIOLOGIST EVAL & MGMT  02/22/2019   LEFT HEART CATH AND CORONARY ANGIOGRAPHY N/A 02/22/2017   Procedure: LEFT HEART CATH AND CORONARY ANGIOGRAPHY;  Surgeon: Nigel Mormon, MD;  Location: [place] [facility];  Service: Cardiovascular;  Laterality: N/A;   LIGATION OF ARTERIOVENOUS  FISTULA Left 0/10/5407   Procedure: Plication of Left Arm Arteriovenous Fistula;  Surgeon: Elam Dutch, MD;  Location: [facility] OR;  Service: Vascular;  Laterality: Left;   POLYPECTOMY     POLYPECTOMY  01/25/2018   Procedure: POLYPECTOMY;  Surgeon: Jerene Bears, MD;  Location: [place];  Service: Gastroenterology;;   REVISON OF ARTERIOVENOUS FISTULA Left 02/19/9146   Procedure: PLICATION OF DISTAL ANEURYSMAL SEGEMENT OF LEFT UPPER ARM ARTERIOVENOUS FISTULA;  Surgeon: Elam Dutch, MD;  Location: [place];  Service: Vascular;  Laterality: Left;   REVISON OF ARTERIOVENOUS FISTULA Left 03/09/5620   Procedure: Plication of Left Upper Arm Fistula ;  Surgeon: Waynetta Sandy, MD;  Location: [place];  Service: Vascular;  Laterality: Left;   SKIN GRAFT SPLIT THICKNESS LEG / FOOT Left    SKIN  GRAFT SPLIT THICKNESS LEFT ARM DONOR SITE: LEFT ANTERIOR THIGH   SKIN SPLIT GRAFT Left 07/04/2017   Procedure: SKIN GRAFT SPLIT THICKNESS LEFT ARM DONOR SITE: LEFT ANTERIOR THIGH;  Surgeon: Elam Dutch, MD;  Location: [place];  Service: Vascular;  Laterality: Left;   THROMBECTOMY W/ EMBOLECTOMY Left 06/05/2017   Procedure: EXPLORATION OF LEFT ARM FOR BLEEDING; OVERSEWED PROXIMAL FISTULA;  Surgeon: Angelia Mould, MD;  Location: [place];  Service: Vascular;  Laterality: Left;   WOUND EXPLORATION Left 06/03/2017   Procedure: WOUND EXPLORATION WITH WOUND VAC APPLICATION TO LEFT ARM;  Surgeon: Angelia Mould, MD;  Location: [facility] OR;  Service: Vascular;  Laterality: Left;   HPI:  Pt is a 60 y.o. male who brought to the ED after being found down at his home-confused/somnolent/hypoxic. Pt was found to have acute hypoxic respiratory failure, COVID-19 infection, dx Acute metabolic toxic encephalopathy. CXR 9/10: Cardiomegaly with vascular congestion and mild edema; no focal consolidation.   PMH: cirrhosis, ESRD on HD, chronic pain, substance abuse (mostly narcotics), COPD, poor adherence to treatment plan/noncompliance. BSE (09/12/20)- recommendations for regular, thin diet with no SLP f/u.   Assessment / Plan / Recommendation Clinical Impression  Pt was seen for bedside swallow evaluation and he denied a history of dysphagia. Oral mechanism exam was [facility] and dentition adequate. Pt consistently demonstrated throat clearing and inconsistently exhibited coughing with trials. Pt does have COVID, but these behaviors were not observed at baseline; laryngeal invasion is suspected. A modified barium swallow study will be completed today at 1400 to further assess physiology. Pt's current diet will be continued until it is completed. SLP Visit Diagnosis: Dysphagia, unspecified (R13.10)    Aspiration Risk  Mild aspiration risk    Diet Recommendation  (Continue current diet until MBS completed)    Liquid Administration via: Cup;Straw Medication Administration: Whole meds with liquid Supervision: Patient able to self feed Compensations: Slow rate;Small sips/bites;Minimize environmental distractions Postural Changes: Seated upright at 90 degrees    Other  Recommendations Oral Care Recommendations: Oral care BID   Follow up Recommendations  (TBD)      Frequency and Duration min 1 x/week  1 week  Prognosis Prognosis for Safe Diet Advancement: Good Barriers to Reach Goals: Cognitive deficits      Swallow Study   General Date of Onset: 03/22/21 HPI: Pt is a 60 y.o. male who brought to the ED after being found down at his home-confused/somnolent/hypoxic. Pt was found to have acute hypoxic respiratory failure, COVID-19 infection, dx Acute metabolic toxic encephalopathy. CXR 9/10: Cardiomegaly with vascular congestion and mild edema; no focal consolidation.   PMH: cirrhosis, ESRD on HD, chronic pain, substance abuse (mostly narcotics), COPD, poor adherence to treatment plan/noncompliance. BSE (09/12/20)- recommendations for regular, thin diet with no SLP f/u. Type of Study: Bedside Swallow Evaluation Previous Swallow Assessment: none Diet Prior to this Study: Dysphagia 3 (soft);Nectar-thick liquids Temperature Spikes Noted: No Respiratory Status: Nasal cannula History of Recent Intubation: No Behavior/Cognition: Alert;Cooperative;Confused;Distractible Oral Cavity Assessment: Within Functional Limits Oral Care Completed by SLP: No Oral Cavity - Dentition: Adequate natural dentition Vision: Functional for self-feeding Self-Feeding Abilities: Needs assist Patient Positioning: Upright in bed;Postural control adequate for testing Baseline Vocal Quality: Hoarse Volitional Cough: Strong Volitional Swallow: Able to elicit    Oral/Motor/Sensory Function Overall Oral Motor/Sensory Function: Within functional limits   Ice Chips Ice chips: Not tested   Thin Liquid Thin Liquid:  Impaired Presentation: Straw Pharyngeal  Phase Impairments: Throat Clearing - Immediate;Throat Clearing - Delayed    Nectar Thick Nectar Thick Liquid: Impaired Presentation: Straw Pharyngeal Phase Impairments: Throat Clearing - Immediate;Throat Clearing - Delayed   Honey Thick Honey Thick Liquid: Not tested   Puree Puree: Impaired Presentation: Spoon Pharyngeal Phase Impairments: Throat Clearing - Immediate;Throat Clearing - Delayed   Solid     Solid: Impaired Presentation: Self Fed Pharyngeal Phase Impairments: Throat Clearing - Immediate;Cough - Delayed     Liliahna Cudd I. Hardin Negus, [place], [place] Office number [phone] Pager [phone]  Horton Marshall 03/23/2021,10:07 AM

## 2021-03-23 NOTE — Progress Notes (Signed)
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 Bilateral maxillary sinus fluid levels without definitive  fracture 4. Considerable motion degradation of the cervical spine. No gross fracture seen 5. Extensive edema within the scalp soft tissues and right greater than left neck. Electronically Signed   By: Donavan Foil M.D.   On: 03/22/2021 18:55   IR Paracentesis  Result Date: 03/10/2021 INDICATION: Patient with history of end-stage renal disease, cirrhosis, hepatitis B/C with recurrent ascites. Request for therapeutic paracentesis EXAM: ULTRASOUND GUIDED THERAPEUTIC PARACENTESIS MEDICATIONS: Lidocaine 1% 10 mL COMPLICATIONS: None immediate. PROCEDURE: Informed written consent was obtained from the patient after a discussion of the risks, benefits and alternatives to treatment. A timeout was performed prior to the initiation of the procedure. Initial ultrasound scanning demonstrates a large amount of ascites within the right lower abdominal quadrant. The right lower abdomen was prepped and draped in the usual sterile fashion. 1% lidocaine was used for local anesthesia. Following this, a 19 gauge, 7-cm, Yueh catheter was introduced. An ultrasound image was saved for documentation purposes. The paracentesis was performed. The catheter was removed and a dressing was applied. The patient tolerated the procedure well without immediate post procedural complication. FINDINGS: A total of approximately 4 L of serosanguineous fluid was removed. IMPRESSION: Successful ultrasound-guided therapeutic paracentesis yielding 4 liters of peritoneal fluid. Read by: Rushie Nyhan, NP Electronically Signed   By: Miachel Roux M.D.   On: 03/10/2021 13:40   IR Paracentesis  Result Date: 02/24/2021 INDICATION: History of ESRD, cirrhosis, hepatitis B and C with recurrent ascites. Request for therapeutic paracentesis up to 4 L. EXAM: ULTRASOUND GUIDED  PARACENTESIS MEDICATIONS: 10 mL 1% lidocaine COMPLICATIONS: None immediate. PROCEDURE: Informed written consent was obtained from the patient after a discussion of  the risks, benefits  and alternatives to treatment. A timeout was performed prior to the initiation of the procedure. Initial ultrasound scanning demonstrates a large amount of ascites within the left lower abdominal quadrant. The left lower abdomen was prepped and draped in the usual sterile fashion. 1% lidocaine was used for local anesthesia. Following this, a 19 gauge, 7-cm, Yueh catheter was introduced. An ultrasound image was saved for documentation purposes. The paracentesis was performed. The catheter was removed and a dressing was applied. The patient tolerated the procedure well without immediate post procedural complication. FINDINGS: A total of approximately 3.7 L of hazy yellow fluid was removed. IMPRESSION: Successful ultrasound-guided paracentesis yielding 3.7 liters of peritoneal fluid. Read by: Durenda Guthrie, PA-C Electronically Signed   By: Miachel Roux M.D.   On: 02/24/2021 14:10

## 2021-03-24 DIAGNOSIS — G9340 Encephalopathy, unspecified: Secondary | ICD-10-CM | POA: Diagnosis not present

## 2021-03-24 LAB — CBC WITH DIFFERENTIAL/PLATELET
Abs Immature Granulocytes: 0.14 10*3/uL — ABNORMAL HIGH (ref 0.00–0.07)
Basophils Absolute: 0 10*3/uL (ref 0.0–0.1)
Basophils Relative: 0 %
Eosinophils Absolute: 0 10*3/uL (ref 0.0–0.5)
Eosinophils Relative: 0 %
HCT: 27.4 % — ABNORMAL LOW (ref 39.0–52.0)
Hemoglobin: 9.5 g/dL — ABNORMAL LOW (ref 13.0–17.0)
Immature Granulocytes: 1 %
Lymphocytes Relative: 8 %
Lymphs Abs: 0.9 10*3/uL (ref 0.7–4.0)
MCH: 26.8 pg (ref 26.0–34.0)
MCHC: 34.7 g/dL (ref 30.0–36.0)
MCV: 77.2 fL — ABNORMAL LOW (ref 80.0–100.0)
Monocytes Absolute: 1 10*3/uL (ref 0.1–1.0)
Monocytes Relative: 9 %
Neutro Abs: 8.8 10*3/uL — ABNORMAL HIGH (ref 1.7–7.7)
Neutrophils Relative %: 82 %
Platelets: 222 10*3/uL (ref 150–400)
RBC: 3.55 MIL/uL — ABNORMAL LOW (ref 4.22–5.81)
RDW: 14.7 % (ref 11.5–15.5)
WBC: 10.8 10*3/uL — ABNORMAL HIGH (ref 4.0–10.5)
nRBC: 0.2 % (ref 0.0–0.2)

## 2021-03-24 LAB — COMPREHENSIVE METABOLIC PANEL
ALT: 21 U/L (ref 0–44)
AST: 28 U/L (ref 15–41)
Albumin: 2.9 g/dL — ABNORMAL LOW (ref 3.5–5.0)
Alkaline Phosphatase: 161 U/L — ABNORMAL HIGH (ref 38–126)
Anion gap: 14 (ref 5–15)
BUN: 29 mg/dL — ABNORMAL HIGH (ref 6–20)
CO2: 26 mmol/L (ref 22–32)
Calcium: 7.1 mg/dL — ABNORMAL LOW (ref 8.9–10.3)
Chloride: 96 mmol/L — ABNORMAL LOW (ref 98–111)
Creatinine, Ser: 5.7 mg/dL — ABNORMAL HIGH (ref 0.61–1.24)
GFR, Estimated: 11 mL/min — ABNORMAL LOW (ref >60)
Glucose, Bld: 153 mg/dL — ABNORMAL HIGH (ref 70–99)
Potassium: 3.6 mmol/L (ref 3.5–5.1)
Sodium: 136 mmol/L (ref 135–145)
Total Bilirubin: 1.1 mg/dL (ref 0.3–1.2)
Total Protein: 5.9 g/dL — ABNORMAL LOW (ref 6.5–8.1)

## 2021-03-24 LAB — MAGNESIUM: Magnesium: 1.9 mg/dL (ref 1.7–2.4)

## 2021-03-24 LAB — CULTURE, BLOOD (ROUTINE X 2)
Culture: NO GROWTH
Culture: NO GROWTH
Special Requests: ADEQUATE

## 2021-03-24 LAB — C-REACTIVE PROTEIN: CRP: 4 mg/dL — ABNORMAL HIGH (ref <1.0)

## 2021-03-24 LAB — D-DIMER, QUANTITATIVE: D-Dimer, Quant: >20 ug/mL-FEU — ABNORMAL HIGH (ref 0.00–0.50)

## 2021-03-24 IMAGING — DX DG CHEST 1V PORT
1 series · 1 of 1 positions shown · non-contrast
Comparison: August 10, 2019

CLINICAL DATA: Shortness of breath and abdominal distention.

EXAM:
PORTABLE CHEST 1 VIEW

[chest ap]
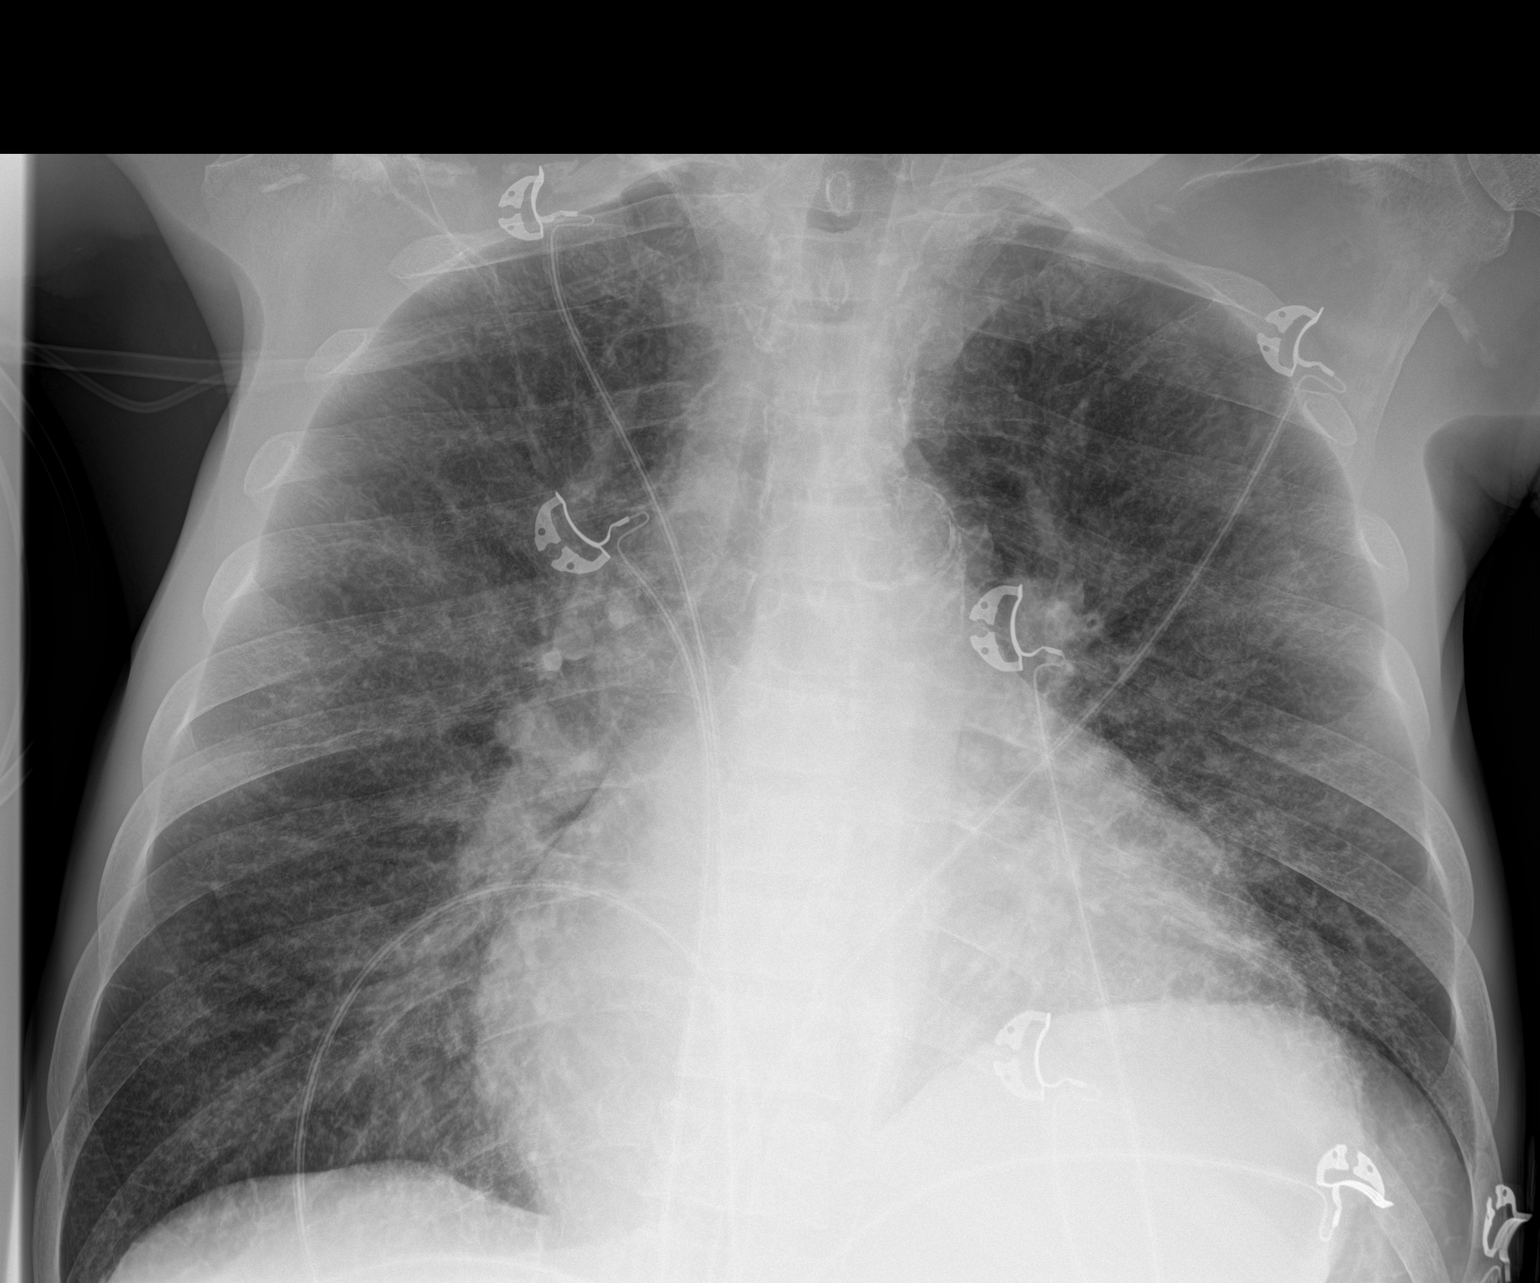

[1 of 1 positions shown; findings below may reference images not displayed]

FINDINGS: Mild chronic appearing increased interstitial lung markings are
again seen.

Very mild hazy infiltrates are also seen along the lateral aspect of
the mid lung fields, right greater than left. This represents a new
finding when compared to the prior study.

There is no evidence of a pleural effusion or pneumothorax.

There is marked severity cardiomegaly which is unchanged in size.

Marked severity calcification of the aortic arch is seen.

The visualized skeletal structures are unremarkable.
IMPRESSION: 1. Chronic appearing increased interstitial lung markings with mild
hazy infiltrates seen along the lateral aspects of the mid lung
fields, right greater than left.

## 2021-03-24 MED ORDER — PNEUMOCOCCAL VAC POLYVALENT 25 MCG/0.5ML IJ INJ
0.5000 mL | INJECTION | INTRAMUSCULAR | Status: AC
  Administered 2021-03-25: 0.5 mL via INTRAMUSCULAR
  Filled 2021-03-24: qty 0.5

## 2021-03-24 MED ORDER — CHLORHEXIDINE GLUCONATE CLOTH 2 % EX PADS
6.0000 | MEDICATED_PAD | Freq: Every day | CUTANEOUS | Status: DC
  Administered 2021-03-25: 6 via TOPICAL

## 2021-03-24 MED ORDER — INFLUENZA VAC SPLIT QUAD 0.5 ML IM SUSY
0.5000 mL | PREFILLED_SYRINGE | INTRAMUSCULAR | Status: AC
  Administered 2021-03-25: 0.5 mL via INTRAMUSCULAR
  Filled 2021-03-24: qty 0.5
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
## 2021-03-24 NOTE — NC FL2 (Signed)
[place] LEVEL OF CARE SCREENING TOOL     IDENTIFICATION  Patient Name: [name] Birthdate: [birth year] Sex: male Admission Date (Current Location): 03/18/2021  [facility] and [place] Number:  Herbalist and Address:  [facility], [place] [address], [place], [place] [address]      Provider Number: [id number]  Attending Physician Name and Address:  [name], MD  Relative Name and Phone Number:       Current Level of Care: Hospital Recommended Level of Care: [place] Prior Approval Number:    Date Approved/Denied:   PASRR Number: [id number]  Discharge Plan: SNF    Current Diagnoses: Patient Active Problem List   Diagnosis Date Noted   COVID-19    Acute respiratory failure with hypoxia ([place]) 03/15/2021   Hypoglycemia 03/03/2021   AMS (altered mental status) 03/03/2021   Acute encephalopathy 03/02/2021   Tylenol overdose 09/07/2020   DNR (do not resuscitate)    CAP (community acquired pneumonia) 02/09/2020   Leukocytosis 01/19/2020   Tobacco use 01/19/2020   Acute pulmonary edema ([place]) 12/21/2019   Acetaminophen overdose 10/27/2019   ESRD (end stage renal disease) ([place])    Goals of care, counseling/discussion    Hypoxia 10/04/2019   Advanced care planning/counseling discussion    Renal failure 09/26/2019   Dyspnea 06/09/2019   Acquired thrombophilia ([place]) 06/05/2019   Chronic atrial fibrillation ([place]) 05/30/2019   Atrial fibrillation with RVR ([place]) 05/29/2019   Melena    Pressure injury of skin 03/09/2019   Abdominal distention    Volume overload 12/28/2018   Sepsis ([place]) 09/12/2018   Coronary artery disease involving native coronary artery of native heart without angina pectoris 03/11/2018   Benign neoplasm of cecum    Benign neoplasm of ascending colon    Benign neoplasm of descending colon    Benign neoplasm of rectum    AF (paroxysmal atrial fibrillation) ([place]) 01/23/2018    Hx of colonic polyps 01/20/2018   End-stage renal disease on hemodialysis ([place]) 11/21/2017   GERD (gastroesophageal reflux disease) 11/16/2017   Decompensated hepatic cirrhosis ([place]) 11/15/2017   Palliative care by specialist    Hyponatremia 11/04/2017   SBP (spontaneous bacterial peritonitis) ([place]) 10/30/2017   Liver disease, chronic 10/30/2017   SOB (shortness of breath)    Abdominal pain 10/28/2017   Upper airway cough syndrome with flattening on f/v loop 10/13/17 c/w vcd 10/17/2017   Elevated diaphragm 10/13/2017   Ileus ([place]) 09/29/2017   QT prolongation 09/29/2017   Malnutrition of moderate degree 09/29/2017   Sinus congestion 09/03/2017   Symptomatic anemia 09/02/2017   Cirrhosis of liver with ascites ([place]) 09/02/2017   Left bundle branch block 09/02/2017   Mitral stenosis 09/02/2017   Hematochezia 07/15/2017   Wide-complex tachycardia ([place])    Endotracheally intubated    ESRD (end stage renal disease) on dialysis ([place]) 07/04/2017   CKD (chronic kidney disease) stage V requiring chronic dialysis ([place]) 06/18/2017   History of Cocaine abuse ([place]) 06/18/2017   Hypertension 06/18/2017   Infection of AV graft for dialysis ([place]) 06/18/2017   Anxiety 06/18/2017   Anemia due to chronic kidney disease 06/18/2017   Atypical atrial flutter ([place]) 06/18/2017   Personality disorder ([place]) 06/13/2017   Cellulitis 06/12/2017   Adjustment disorder with mixed anxiety and depressed mood 06/10/2017   Suicidal ideation 06/10/2017   Arm wound, left, sequela 06/10/2017   Dyspnea on exertion 05/29/2017   Tachycardia 05/29/2017   Hyperkalemia 05/22/2017  Acute metabolic encephalopathy    Anemia 04/23/2017   Ascites 04/23/2017   COPD (chronic obstructive pulmonary disease) ([place]) 04/23/2017   Acute on chronic respiratory failure with hypoxia (HCC) 03/25/2017   Arrhythmia 03/25/2017   COPD GOLD 0 with flattening on inps f/v  09/27/2016   Essential hypertension 09/27/2016   Fluid overload  08/30/2016   COPD exacerbation ([place]) 08/17/2016   Hypertensive urgency 08/17/2016   Problem with dialysis access ([place]) 07/23/2016   Chronic hepatitis B ([place]) 03/05/2014   Chronic hepatitis C without hepatic coma ([place]) 03/05/2014   Internal hemorrhoids with bleeding, swelling and itching 03/05/2014   Thrombocytopenia ([place]) 03/05/2014   Chest pain 02/27/2014   Alcohol abuse 04/14/2009   Nicotine dependence, cigarettes, uncomplicated 16/04/9603   GANGLION CYST 04/14/2009    Orientation RESPIRATION BLADDER Height & Weight     Self, Time, Situation, Place (some confusion)  O2 (2L nasal cannula) Continent Weight: 162 lb 0.6 oz (73.5 kg) Height:     BEHAVIORAL SYMPTOMS/MOOD NEUROLOGICAL BOWEL NUTRITION STATUS      Continent Diet (see dc summary)  AMBULATORY STATUS COMMUNICATION OF NEEDS Skin   Extensive Assist Verbally Normal                       Personal Care Assistance Level of Assistance  Bathing, Feeding, Dressing Bathing Assistance: Limited assistance Feeding assistance: Independent Dressing Assistance: Limited assistance     Functional Limitations Info  Sight Sight Info: Impaired        SPECIAL CARE FACTORS FREQUENCY  PT (By licensed PT), OT (By licensed OT)     PT Frequency: 5x/week OT Frequency: 5x/week            Contractures Contractures Info: Not present    Additional Factors Info  Code Status, Allergies Code Status Info: DNR Allergies Info: Tramadol, Grass Extracts (Gramineae Pollens), Morphine And Related, Pollen Extract, Acetaminophen, Aspirin, Clonidine Derivatives           Current Medications (03/24/2021):  This is the current hospital active medication list Current Facility-Administered Medications  Medication Dose Route Frequency Provider Last Rate Last Admin   0.9 %  sodium chloride infusion  100 mL Intravenous PRN Opyd, Ilene Qua, MD       0.9 %  sodium chloride infusion  100 mL Intravenous PRN Opyd, Ilene Qua, MD       acetaminophen  (TYLENOL) tablet 500 mg  500 mg Oral Q8H PRN [name], MD       albuterol (VENTOLIN HFA) 108 (90 Base) MCG/ACT inhaler 2 puff  2 puff Inhalation Q6H Opyd, Ilene Qua, MD   2 puff at 03/24/21 0756   alteplase (CATHFLO ACTIVASE) injection 2 mg  2 mg Intracatheter Once PRN Opyd, Ilene Qua, MD       amiodarone (PACERONE) tablet 200 mg  200 mg Oral q AM [name], MD   200 mg at 03/24/21 0800   Ampicillin-Sulbactam (UNASYN) 3 g in sodium chloride 0.9 % 100 mL IVPB  3 g Intravenous Q12H Lala Lund K, MD 200 mL/hr at 03/24/21 1111 3 g at 03/24/21 1111   calcitRIOL (ROCALTROL) capsule 1.5 mcg  1.5 mcg Oral Daily Claudia Desanctis, MD   1.5 mcg at 03/24/21 1113   Chlorhexidine Gluconate Cloth 2 % PADS 6 each  6 each Topical Q0600 Vianne Bulls, MD   6 each at 03/24/21 0628   cinacalcet (SENSIPAR) tablet 60 mg  60 mg Oral BID WC [name],  MD   60 mg at 03/24/21 0753   diltiazem (CARDIZEM) tablet 30 mg  30 mg Oral Q6H Ghimire, Shanker M, MD   30 mg at 03/24/21 1119   haloperidol lactate (HALDOL) injection 2 mg  2 mg Intravenous Q8H PRN [name], MD   2 mg at 03/23/21 2304   heparin injection 1,000 Units  1,000 Units Dialysis PRN Opyd, Ilene Qua, MD       heparin injection 5,000 Units  5,000 Units Subcutaneous Q8H Opyd, Ilene Qua, MD   5,000 Units at 03/24/21 0558   [START ON 29-Mar-2021] influenza vac split quadrivalent PF (FLUARIX) injection 0.5 mL  0.5 mL Intramuscular Tomorrow-1000 [name], MD       lactulose (CHRONULAC) 10 GM/15ML solution 20 g  20 g Oral Daily Jonetta Osgood, MD   20 g at 03/23/21 0851   lidocaine (PF) (XYLOCAINE) 1 % injection 5 mL  5 mL Intradermal PRN Opyd, Ilene Qua, MD       lidocaine-prilocaine (EMLA) cream 1 application  1 application Topical PRN Opyd, Ilene Qua, MD       metoprolol tartrate (LOPRESSOR) injection 5 mg  5 mg Intravenous Q1H PRN Ghimire, Henreitta Leber, MD       midodrine (PROAMATINE) tablet 10 mg  10 mg Oral Q8H [name], MD   10 mg at 03/24/21 1113   naloxone (NARCAN) injection 0.4 mg  0.4 mg Intravenous PRN [name], MD       pantoprazole (PROTONIX) EC tablet 40 mg  40 mg Oral Q1200 Karren Cobble, RPH   40 mg at 03/24/21 1118   pentafluoroprop-tetrafluoroeth (GEBAUERS) aerosol 1 application  1 application Topical PRN Opyd, Ilene Qua, MD       [START ON Mar 29, 2021] pneumococcal 23 valent vaccine (PNEUMOVAX-23) injection 0.5 mL  0.5 mL Intramuscular Tomorrow-1000 [name], MD       polyethylene glycol (MIRALAX / GLYCOLAX) packet 17 g  17 g Oral Daily PRN [name], MD       sevelamer carbonate (RENVELA) tablet 1,600 mg  1,600 mg Oral TID PRN [name], MD       sevelamer carbonate (RENVELA) tablet 3,200 mg  3,200 mg Oral TID WC [name], MD   3,200 mg at 03/24/21 1119     Discharge Medications: Please see discharge summary for a list of discharge medications.  Relevant Imaging Results:  Relevant Lab Results:   Additional Information SSN: [id number]. COVID + on 03/15/2021. MWF 12:40pm at [facility], [place]

## 2021-03-24 NOTE — Evaluation (Signed)
Physical Therapy Evaluation Patient Details Name: [name] MRN: [id number] DOB: Jul 13, 1960 Today's Date: 03/24/2021  History of Present Illness  Pt is 60 yo male found down in his home and admitted with hypoxic resp failure on 04/01/2021 due to COVID 19 PNE, aspiration PNE, and pulmonary edema in setting of ESRD. Pt with hx including ESRD on HD, chronic pain, substance abuse, COPD, fingers amputated R hand, and non compliance.  Clinical Impression  Pt admitted with above diagnosis. Pt is questionable historian due to confusion, but per prior notes and pts report he lives alone, fairly independent with cane, and has personal care aide a few hours per day.  Today, pt confused, very slow to respond, needs multimodal cues and assist to initiate all task.  He required min-mod A for gait and transfers and only ambulated ~6'.  He was on 2 L O2 with VSS.  Due to lives alone, decreased cognition, and decreased mobility recommend SNF.  Pt currently with functional limitations due to the deficits listed below (see PT Problem List). Pt will benefit from skilled PT to increase their independence and safety with mobility to allow discharge to the venue listed below.          Recommendations for follow up therapy are one component of a multi-disciplinary discharge planning process, led by the attending physician.  Recommendations may be updated based on patient status, additional functional criteria and insurance authorization.  Follow Up Recommendations SNF    Equipment Recommendations  Rolling walker with 5" wheels    Recommendations for Other Services       Precautions / Restrictions Precautions Precautions: Fall      Mobility  Bed Mobility Overal bed mobility: Needs Assistance Bed Mobility: Supine to Sit     Supine to sit: Mod assist     General bed mobility comments: increased time; assist to initiate    Transfers Overall transfer level: Needs assistance Equipment used:  Rolling walker (2 wheeled) Transfers: Sit to/from Stand Sit to Stand: Min assist;Mod assist         General transfer comment: First stand from bed with mod A; second from [facility] with min A; both with assist to initiate and slow to rise  Ambulation/Gait Ambulation/Gait assistance: Mod assist Gait Distance (Feet): 6 Feet Assistive device: Rolling walker (2 wheeled) Gait Pattern/deviations: Step-to pattern;Decreased stride length;Trunk flexed Gait velocity: decreased   General Gait Details: Assist for RW and balance; only taking a few steps to bsc and chair; moving very slowly with assist to weight shift  Stairs            Wheelchair Mobility    Modified Rankin (Stroke Patients Only)       Balance Overall balance assessment: Needs assistance Sitting-balance support: Bilateral upper extremity supported Sitting balance-Leahy Scale: Poor Sitting balance - Comments: requiring UE   Standing balance support: Bilateral upper extremity supported Standing balance-Leahy Scale: Poor Standing balance comment: requiring min A and RW                             Pertinent Vitals/Pain      Home Living Family/patient expects to be discharged to:: Unsure Living Arrangements: Alone   Type of Home: Apartment Home Access: Stairs to enter Entrance Stairs-Rails: None Entrance Stairs-Number of Steps: 2 Home Layout: One level Home Equipment: Walker - 2 wheels;Cane - single point;Shower seat Additional Comments: Pt confused and unable to provide more than lives alone in apartment.  Other information per prior admission    Prior Function Level of Independence: Needs assistance         Comments: Pt unable to provide; per note ~10 months ago: no AD needed, reports some assist from aide with ADls/IADLs as needed; driving     Hand Dominance        Extremity/Trunk Assessment   Upper Extremity Assessment Upper Extremity Assessment: Difficult to assess due to impaired  cognition (MMT at least 3/5 throughout and no obvious ROM deficits, but pt not following further commands)    Lower Extremity Assessment Lower Extremity Assessment: Difficult to assess due to impaired cognition (MMT at least 3/5 throughout and no obvious ROM deficits, but pt not following further commands)    Cervical / Trunk Assessment Cervical / Trunk Assessment: Normal  Communication   Communication: Other (comment) (quiet garbbled speech)  Cognition Arousal/Alertness: Lethargic Behavior During Therapy: Flat affect Overall Cognitive Status: Impaired/Different from baseline Area of Impairment: Orientation;Awareness;Attention;Problem solving;Following commands;Safety/judgement                 Orientation Level: Disoriented to;Place;Time;Situation Current Attention Level: Sustained   Following Commands: Follows one step commands inconsistently Safety/Judgement: Decreased awareness of safety;Decreased awareness of deficits Awareness: Emergent Problem Solving: Slow processing;Decreased initiation;Difficulty sequencing;Requires verbal cues;Requires tactile cues General Comments: Moves very slowly requirng multimodal cues and assist to initiated      General Comments General comments (skin integrity, edema, etc.): Cues to focus on task at hand (ex pt trying to have bm on bsc but keeps reaching for breakfast tray to prep - cued to finish then we would set up breakfast)    Exercises     Assessment/Plan    PT Assessment Patient needs continued PT services  PT Problem List Decreased strength;Decreased mobility;Decreased safety awareness;Decreased range of motion;Decreased coordination;Decreased knowledge of precautions;Decreased activity tolerance;Decreased cognition;Cardiopulmonary status limiting activity;Decreased balance;Decreased knowledge of use of DME       PT Treatment Interventions DME instruction;Therapeutic activities;Gait training;Therapeutic exercise;Patient/family  education;Stair training;Balance training;Functional mobility training    PT Goals (Current goals can be found in the Care Plan section)  Acute Rehab PT Goals Patient Stated Goal: get warm PT Goal Formulation: With patient Time For Goal Achievement: 04/07/21 Potential to Achieve Goals: Good    Frequency Min 2X/week   Barriers to discharge Decreased caregiver support      Co-evaluation               AM-PAC PT "6 Clicks" Mobility  Outcome Measure Help needed turning from your back to your side while in a flat bed without using bedrails?: A Little Help needed moving from lying on your back to sitting on the side of a flat bed without using bedrails?: A Lot Help needed moving to and from a bed to a chair (including a wheelchair)?: A Lot Help needed standing up from a chair using your arms (e.g., wheelchair or bedside chair)?: A Lot Help needed to walk in hospital room?: A Lot Help needed climbing 3-5 steps with a railing? : A Lot 6 Click Score: 13    End of Session Equipment Utilized During Treatment: Gait belt Activity Tolerance: Patient tolerated treatment well Patient left: with chair alarm set;in chair;with call bell/phone within reach (breakfast set up; c/o being cold- temp turned to max per pt request, sat in sun, covered with blankets) Nurse Communication: Mobility status PT Visit Diagnosis: Unsteadiness on feet (R26.81);Other abnormalities of gait and mobility (R26.89);Muscle weakness (generalized) (M62.81)    Time: 6606-3016 PT  Time Calculation (min) (ACUTE ONLY): 36 min   Charges:   PT Evaluation $PT Eval Moderate Complexity: 1 Mod PT Treatments $Therapeutic Activity: 8-22 mins        Abran Richard, PT Acute Rehab Services Pager [phone] [facility] [phone]   Karlton Lemon 03/24/2021, 11:23 AM

## 2021-03-24 NOTE — Progress Notes (Signed)
Contacted [facility] to advise clinic of pt's covid diagnosis. Spoke to [place], Therapist, sports. Pt can resume his regular days/time (MWF at 12:40) at d/c but will need to arrive with a N95 mask (if pt does not have one, clinic can provide) and enter by the side door. Clinic will need to be contacted at d/c.   Melven Sartorius  Renal Navigator [phone]

## 2021-03-24 NOTE — Progress Notes (Signed)
[facility] Progress Note  Subjective:  had 2.9 kg UF with 9/12 HD treatment.  Felt better after HD. Has been on 2 liters oxygen.   Review of systems:   Denies shortness of breath  Denies n/v    Vitals:   03/24/21 0356 03/24/21 0745 03/24/21 0753 03/24/21 0829  BP: 120/65 114/77 117/68 126/88  Pulse: 61 (!) 136 (!) 112 (!) 113  Resp: 15 19 17 19   Temp: 97.6 F (36.4 C) (!) 97.5 F (36.4 C)  97.7 F (36.5 C)  TempSrc: Oral Oral  Axillary  SpO2: 97% 100%  95%  Weight:        Exam:  General adult male seated in NAD HEENT normocephalic atraumatic  Neck supple trachea midline Lungs normal work of breathing at rest clear to auscultation  Heart S1S2 no rub Abdomen soft nontender somewhat distended Extremities no edema  Psych normal mood and affect Access:   R UE AVG+bruit    OP HD: [place] MWF   4h 450/1.5  69kg   2/2 bath  Hep none  R AVG Calcitriol 1.65mcg PO qHD-last dose 03/11/21 Sensipar 30mg  3x/weekly-last dose 03/09/21   Assessment/Plan:  COVID+ - CXR looks like PNA and/ or edema. S/p remdesivir per primary team. On unasyn  BP - chronic soft BP's, on amio / Cardizem for afib rate control, and on midodrine 10 tid. Optimize volume with HD Acute hypoxic resp. Failure - secondary to covid and overload.  Some of the extra wt is recurrent ascites. Oxygen support as needed. Optimize volume with HD ESRD - HD per MWF schedule   Anemia of ESRD: not needed ESA as OP of late; may dose tomorrow Metabolic Bone Disease: resumed calcitriol; on sensipar as well as renvela Altered MS-  HCT no obvious issue  Afib - on amio and dilt per primary  Disposition per primary team.  Discussed with HD SW to notify unit of his status.  Recent Labs  Lab 03/20/21 0630 03/21/21 0043 03/23/21 1634 03/23/21 2231 03/24/21 0235  K 4.1   < > 2.0* 3.7 3.6  BUN 21*   < > 8  --  29*  CREATININE 4.96*   < > 2.15*  --  5.70*  CALCIUM 8.5*   < > 8.1*  --  7.1*  PHOS 4.7*  --   --   --    --   HGB 9.8*   < > 10.6*  --  9.5*   < > = values in this interval not displayed.   Inpatient medications:  albuterol  2 puff Inhalation Q6H   amiodarone  200 mg Oral q AM   calcitRIOL  1.5 mcg Oral Daily   Chlorhexidine Gluconate Cloth  6 each Topical Q0600   cinacalcet  60 mg Oral BID WC   diltiazem  30 mg Oral Q6H   heparin  5,000 Units Subcutaneous Q8H   [START ON Mar 29, 2021] influenza vac split quadrivalent PF  0.5 mL Intramuscular Tomorrow-1000   lactulose  20 g Oral Daily   midodrine  10 mg Oral Q8H   pantoprazole  40 mg Oral Q1200   [START ON 29-Mar-2021] pneumococcal 23 valent vaccine  0.5 mL Intramuscular Tomorrow-1000   sevelamer carbonate  3,200 mg Oral TID WC    sodium chloride     sodium chloride     ampicillin-sulbactam (UNASYN) IV 3 g (03/24/21 1111)   sodium chloride, sodium chloride, acetaminophen, alteplase, haloperidol lactate, heparin, lidocaine (PF), lidocaine-prilocaine, metoprolol tartrate, naLOXone (NARCAN)  injection,  pentafluoroprop-tetrafluoroeth, polyethylene glycol, sevelamer carbonate   Claudia Desanctis, MD 12:01 PM 03/24/2021

## 2021-03-25 DIAGNOSIS — G9340 Encephalopathy, unspecified: Secondary | ICD-10-CM | POA: Diagnosis not present

## 2021-03-26 IMAGING — DX DG CHEST 1V PORT
1 series · 1 of 1 positions shown · non-contrast
Comparison: Chest radiograph dated 09/23/2019.

CLINICAL DATA: 59-year-old male with cough and weakness.

EXAM:
PORTABLE CHEST 1 VIEW

[chest ap]
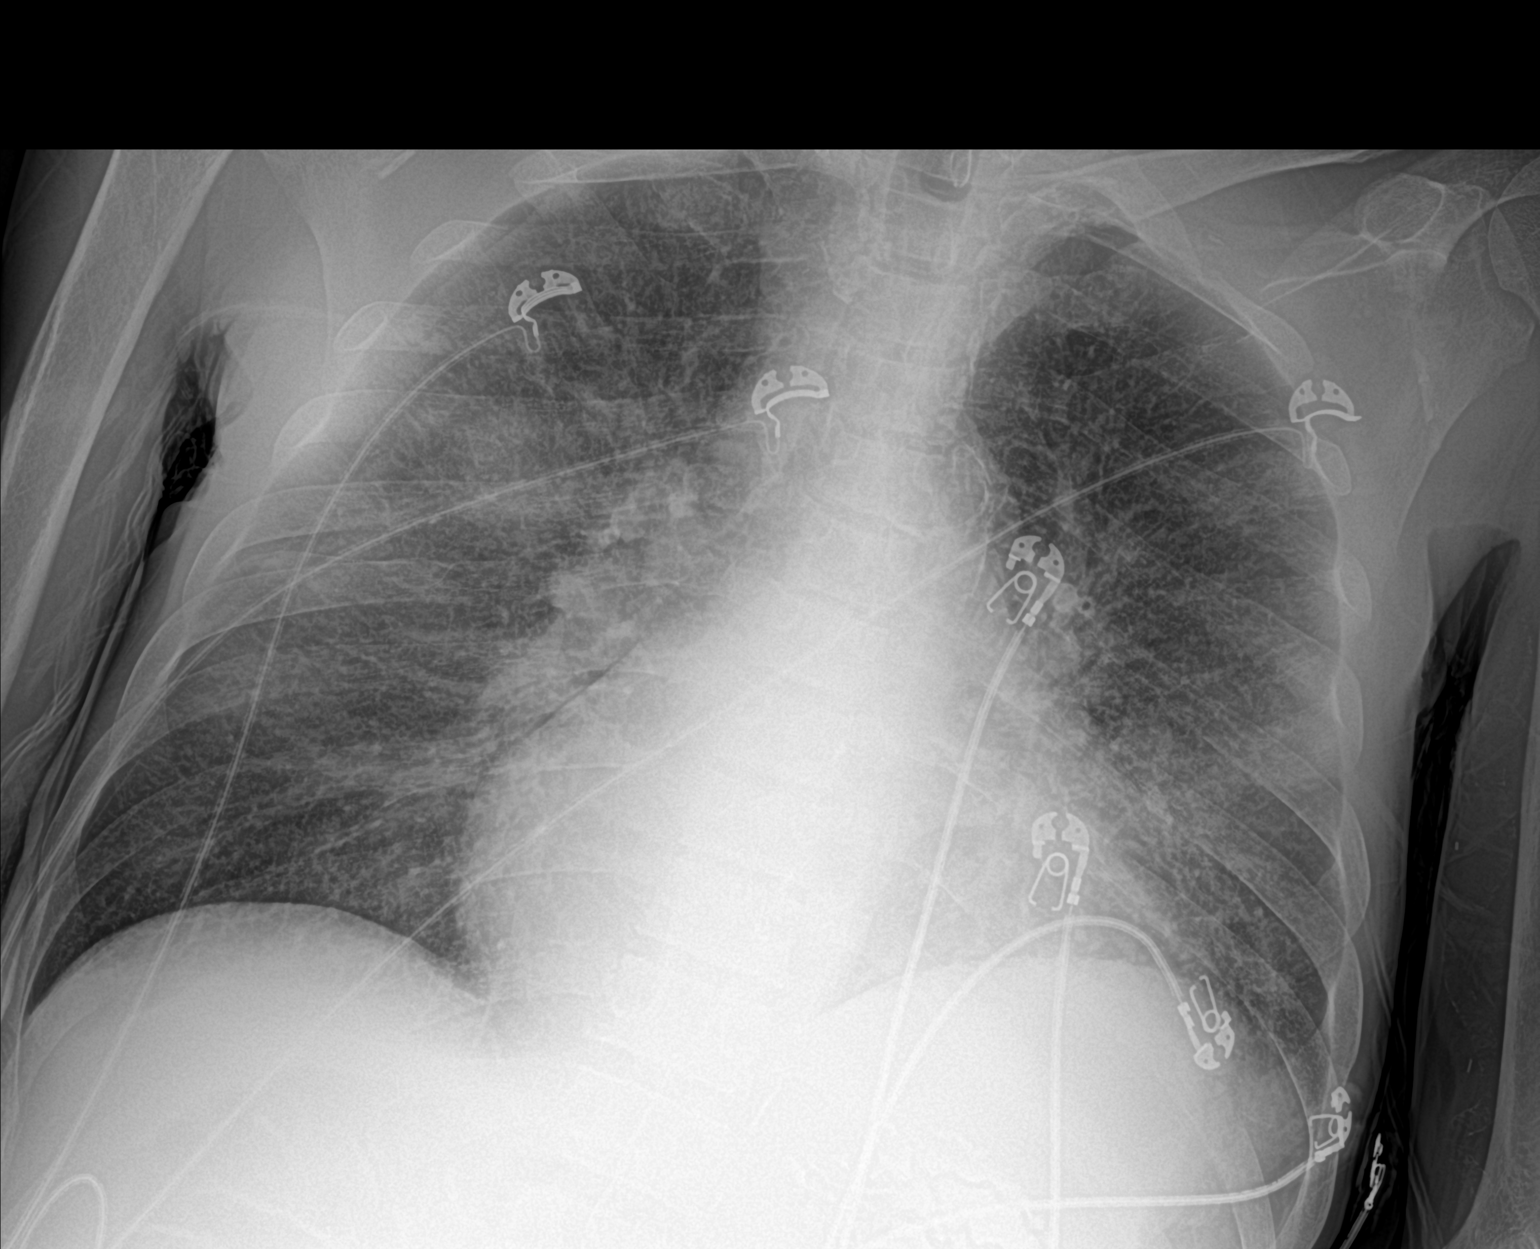

[1 of 1 positions shown; findings below may reference images not displayed]

FINDINGS: There is diffuse interstitial coarsening and mild chronic bronchitic
changes. Faint bilateral mid lung field densities concerning for
developing infiltrate. Clinical correlation is recommended. No large
pleural effusion or pneumothorax. There is stable cardiomegaly.
Atherosclerotic calcification of the aorta. No acute osseous
pathology.
IMPRESSION: Bilateral mid lung field densities concerning for developing
infiltrate. Clinical correlation and follow-up recommended.

## 2021-03-26 IMAGING — DX DG CHEST 1V PORT
1 series · 1 of 1 positions shown · non-contrast
Comparison: Earlier on the same date

CLINICAL DATA: Endotracheal tube and enteric tube placement.

EXAM:
PORTABLE CHEST 1 VIEW

[chest ap]
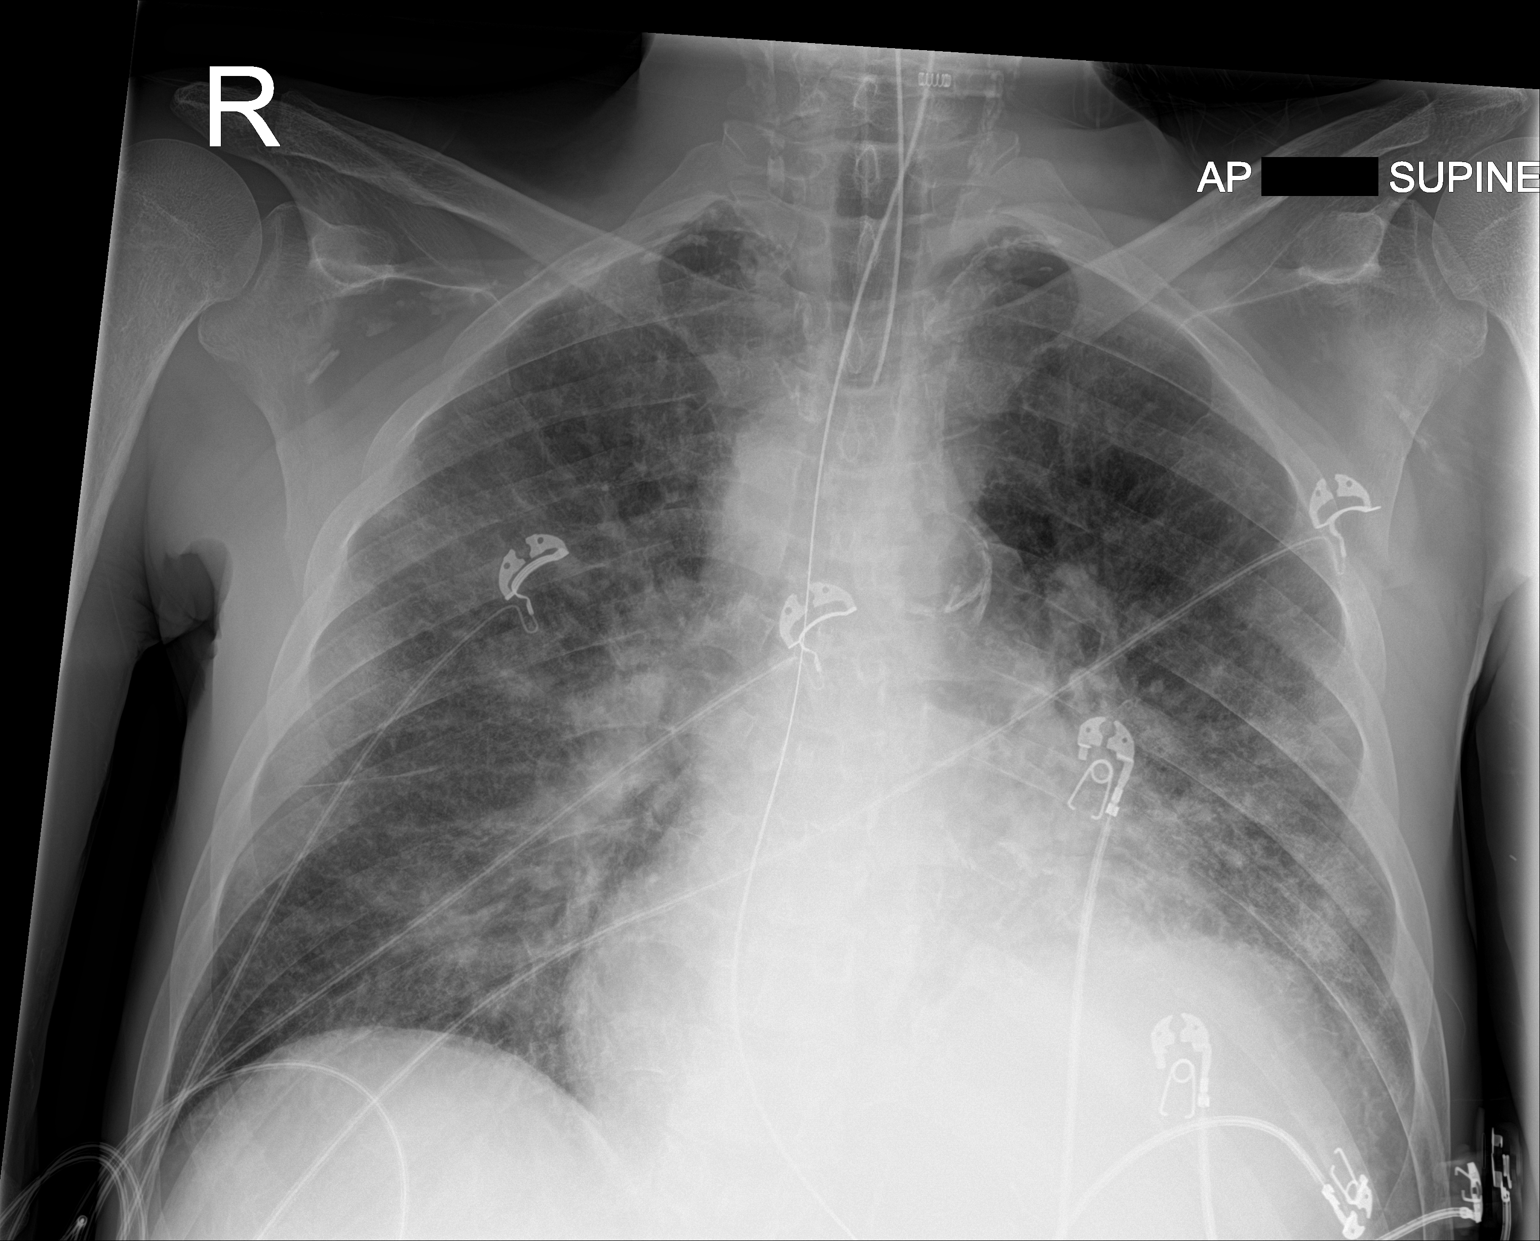

[1 of 1 positions shown; findings below may reference images not displayed]

FINDINGS: Interval placement of an endotracheal 2, its tip projecting 6.5 cm
above the carina. Interval placement of an enteric tube which
traverses the left upper abdominal quadrant in the expected location
of the gastric lumen, its tip not imaged. Stable moderate
cardiomegaly with vascular congestion, moderate interstitial edema,
and diffuse interstitial and patchy airspace opacities in both
lungs. Thoracic aorta and bilateral subclavian and axillary coarse
atherosclerotic calcifications. Telemetry leads. Probable trace left
pleural effusion. No pneumothorax.
IMPRESSION: Interval placement of an endotracheal tube and enteric tube; a 3 cm
advancement of the endotracheal tube could be considered.

Stable moderate cardiomegaly with pulmonary edema, trace left
pleural effusion and diffuse pulmonary disease which could be
secondary to edema or infectious.

Aorta and bilateral subclavian and axillary coarse atherosclerotic
calcifications.
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
## 2021-03-28 IMAGING — DX DG CHEST 1V PORT
1 series · 2 of 2 positions shown · non-contrast
Comparison: 09/26/2019.

CLINICAL DATA: Acute respiratory failure.

EXAM:
PORTABLE CHEST 1 VIEW

[Series 1: chest · 0.14mm/px · 2 of 2 slices shown]
[im 1/2]
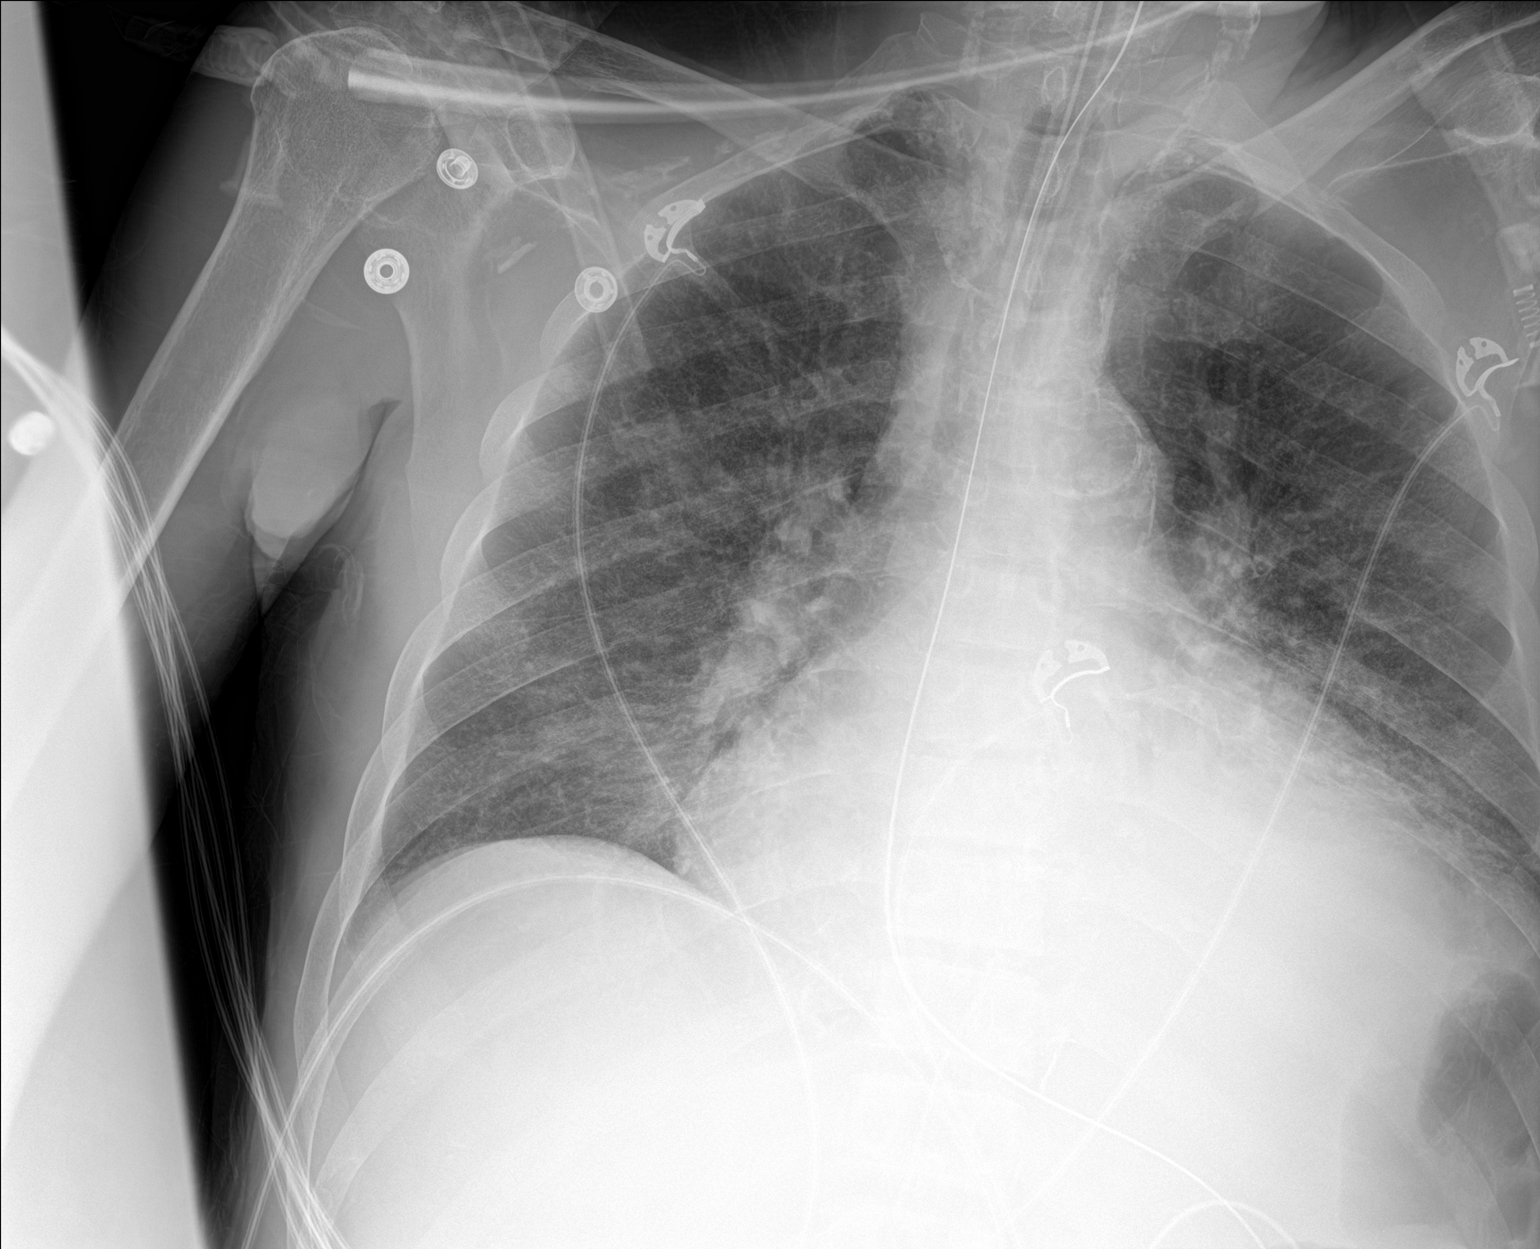
[im 2/2]
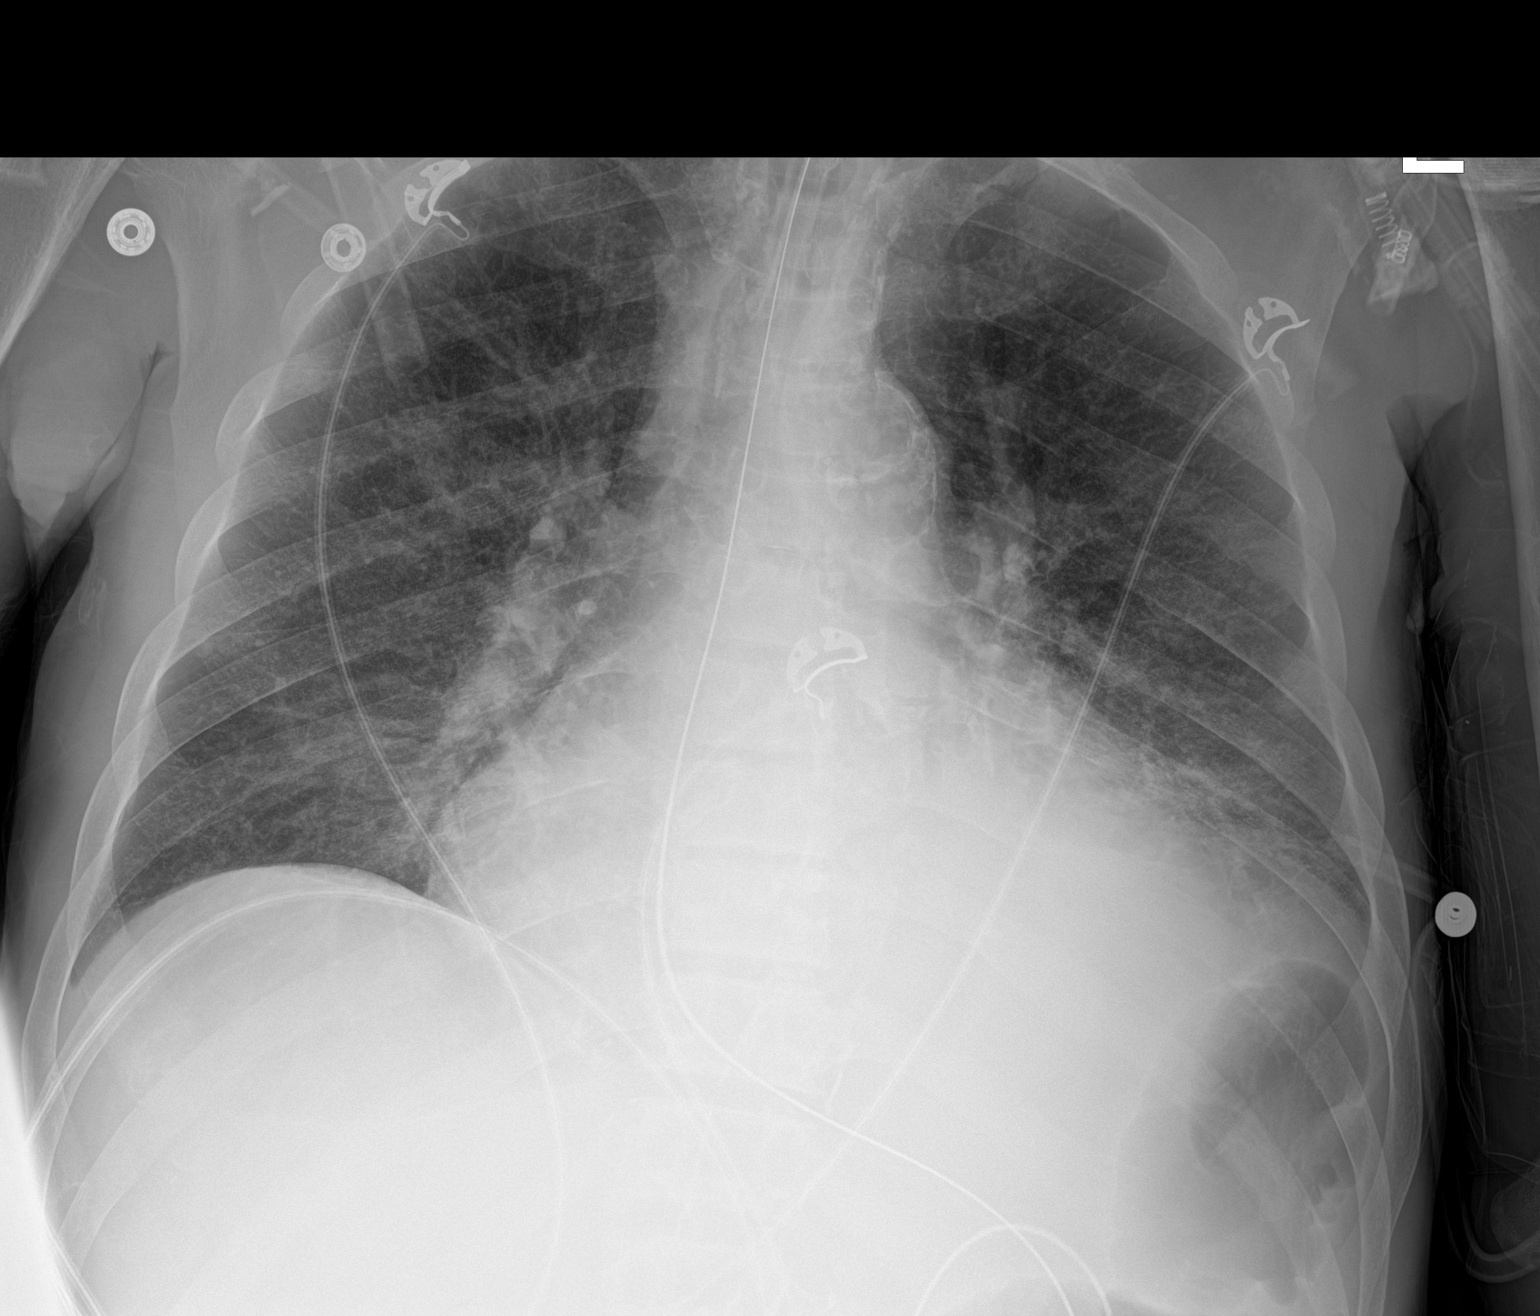

[2 of 2 positions shown; findings below may reference images not displayed]

FINDINGS: Endotracheal tube, NG tube in stable position. Cardiomegaly.
Persistent bilateral interstitial prominence. Findings suggest CHF
with interstitial edema. Pneumonitis cannot be excluded. Low lung
volumes with bibasilar atelectasis. Small left pleural effusion
cannot be excluded. No pneumothorax. Aortic, bilateral subclavian,
bilateral carotid atherosclerotic vascular calcification.
IMPRESSION: 1.  Endotracheal tube and NG tube in stable position.

2. Cardiomegaly persistent diffuse pulmonary interstitial prominence
without significant change from prior exam. Findings suggest CHF.
Small left pleural effusion cannot be excluded.

3.  Low lung volumes with bibasilar atelectasis.

4. Aortic, bilateral subclavian, bilateral carotid atherosclerotic
vascular disease.

## 2021-03-31 IMAGING — DX DG ABD PORTABLE 1V
2 series · 2 of 2 positions shown · non-contrast
Comparison: None.

CLINICAL DATA: Abdominal pain.

EXAM:
PORTABLE ABDOMEN - 1 VIEW

[abdomen kub (1 of 2)]
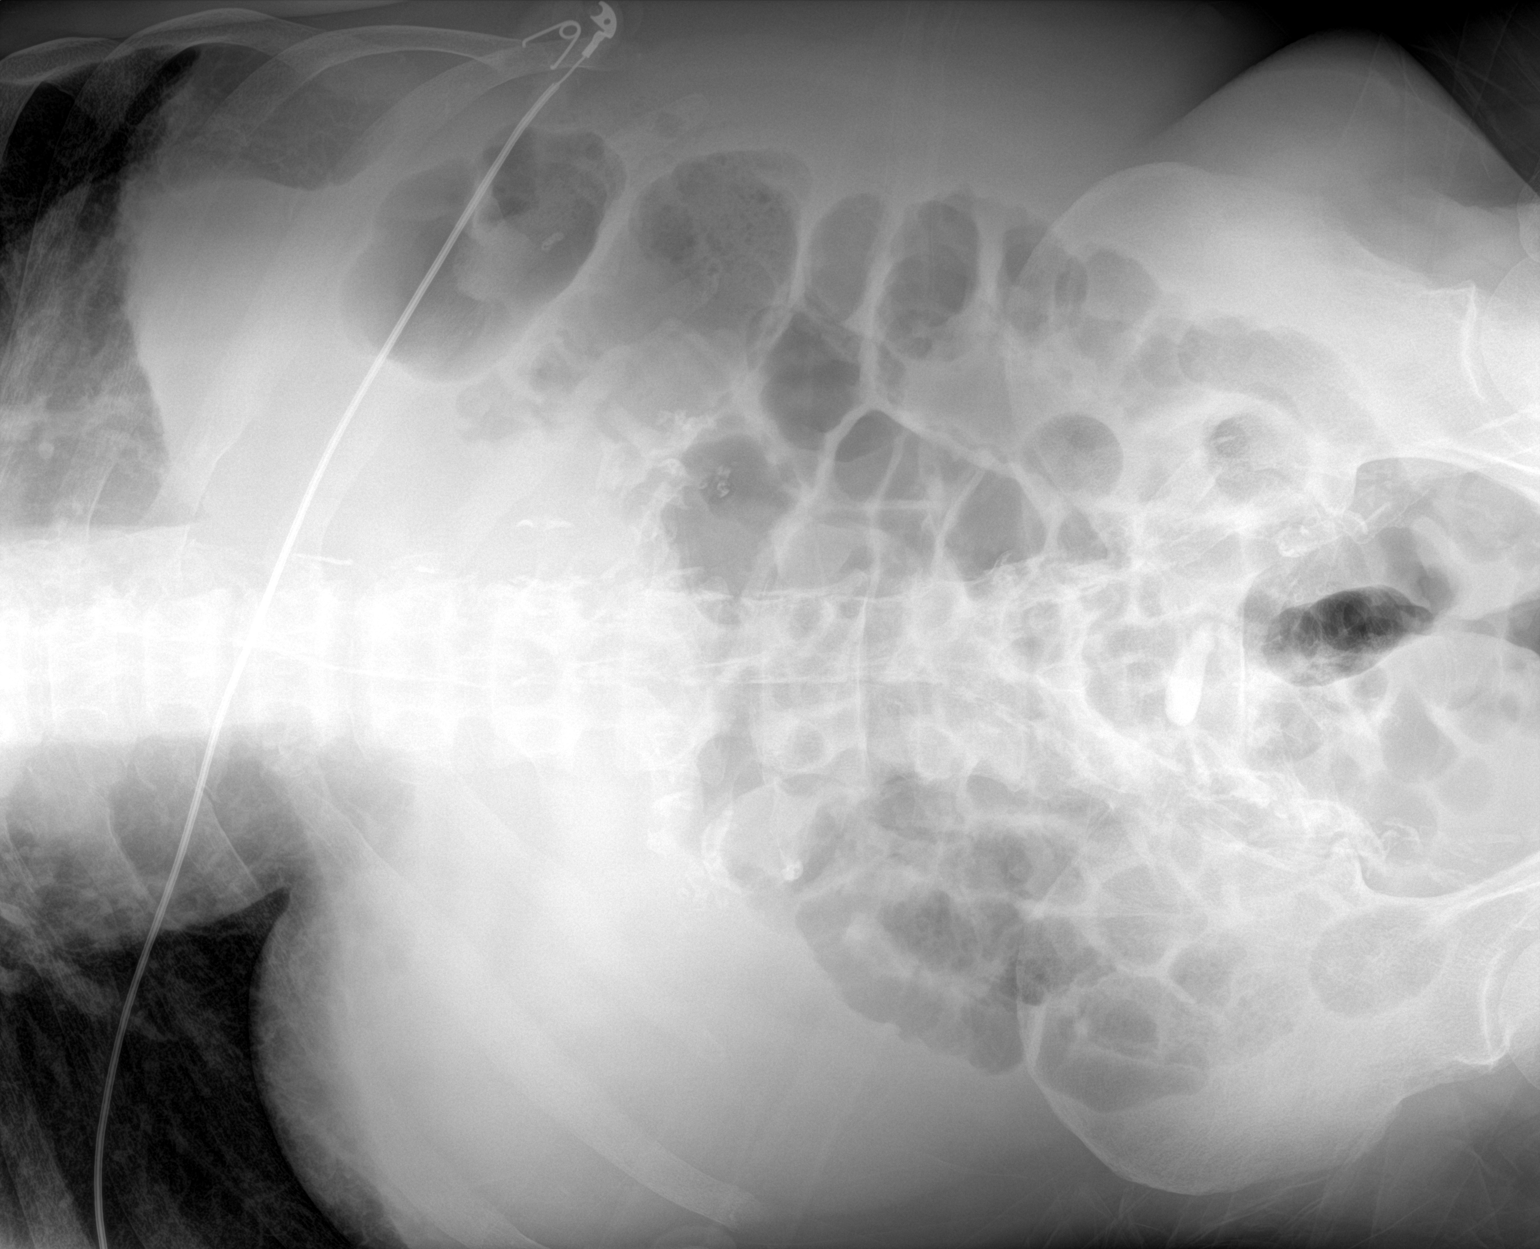

[abdomen kub (2 of 2)]
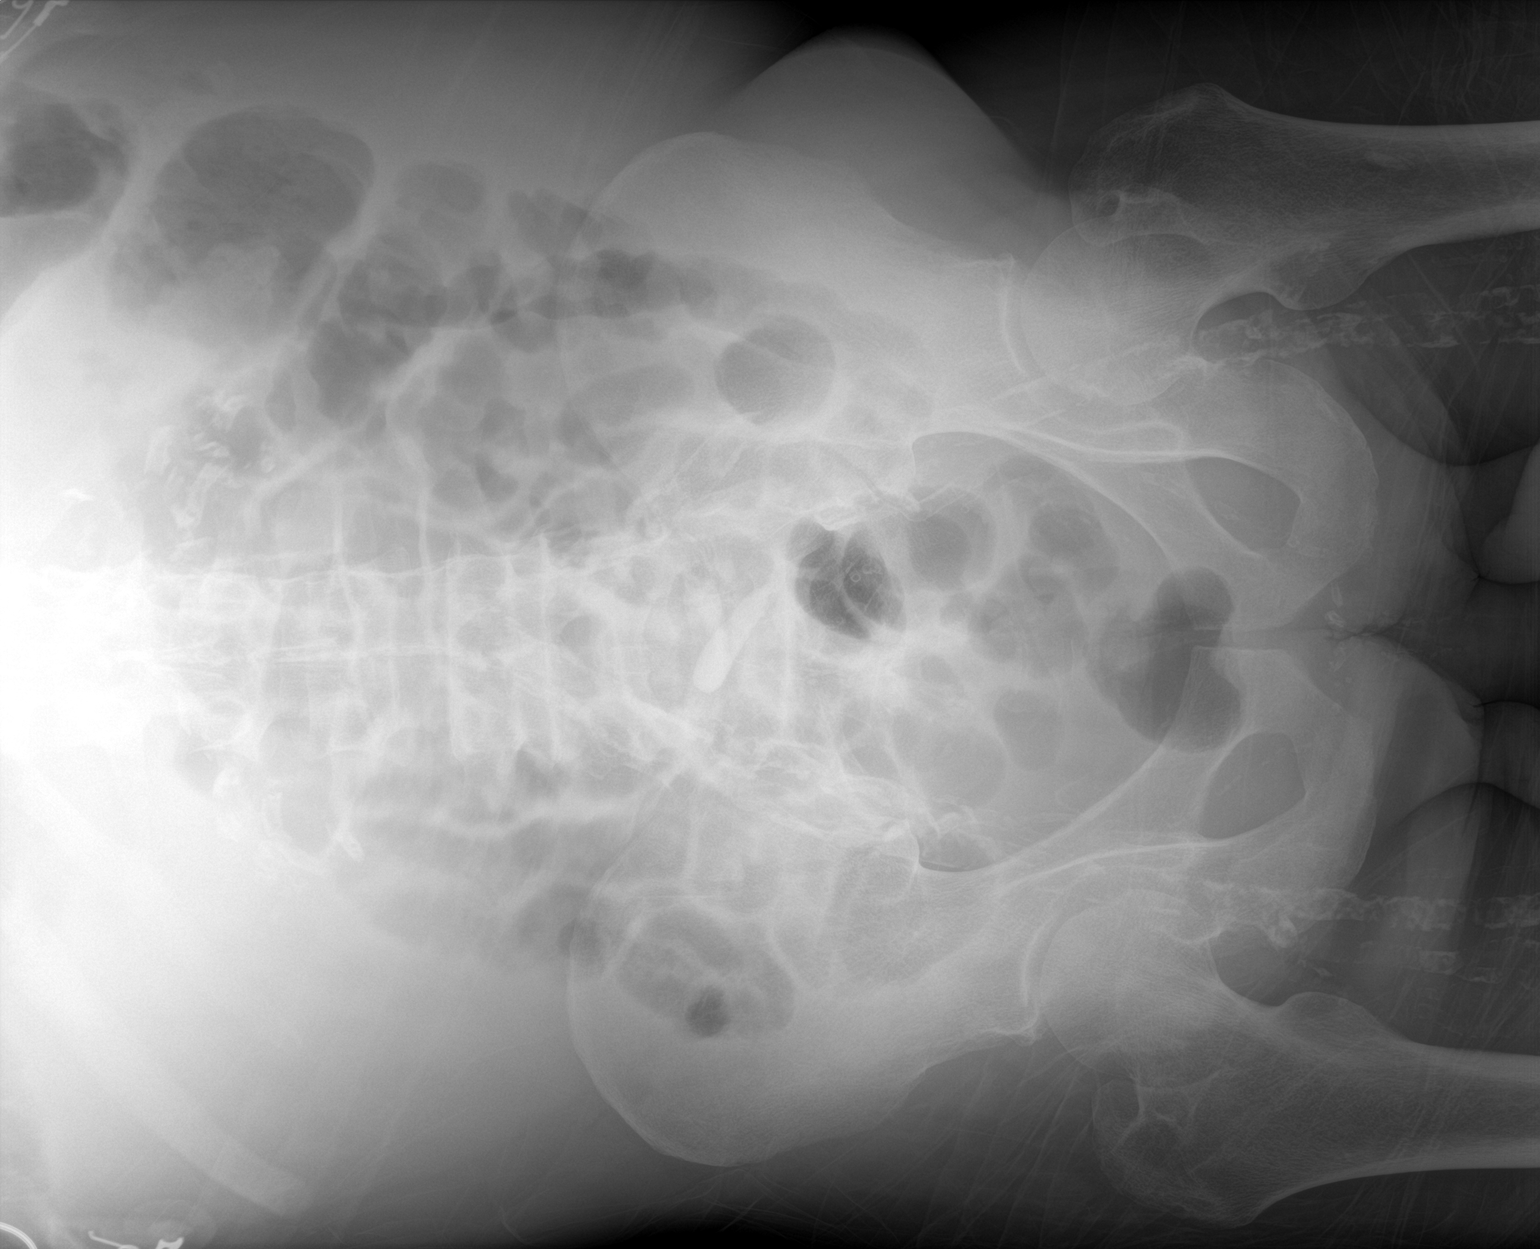

[2 of 2 positions shown; findings below may reference images not displayed]

FINDINGS: Decubitus films are identified. No free air identified. Air-filled
loops of large and small bowel are normal in caliber with no
evidence of obstruction.
IMPRESSION: No evidence of free air.  No evidence of obstruction identified.

## 2021-04-04 IMAGING — DX DG CHEST 1V PORT
1 series · 1 of 1 positions shown · non-contrast
Comparison: Chest radiograph 09/30/2019

CLINICAL DATA: Hypoxia. Additional history provided: Patient from
home via EMS with lower abdominal pain, had dialysis yesterday,
reportedly COVID positive 316

EXAM:
PORTABLE CHEST 1 VIEW

[chest ap]
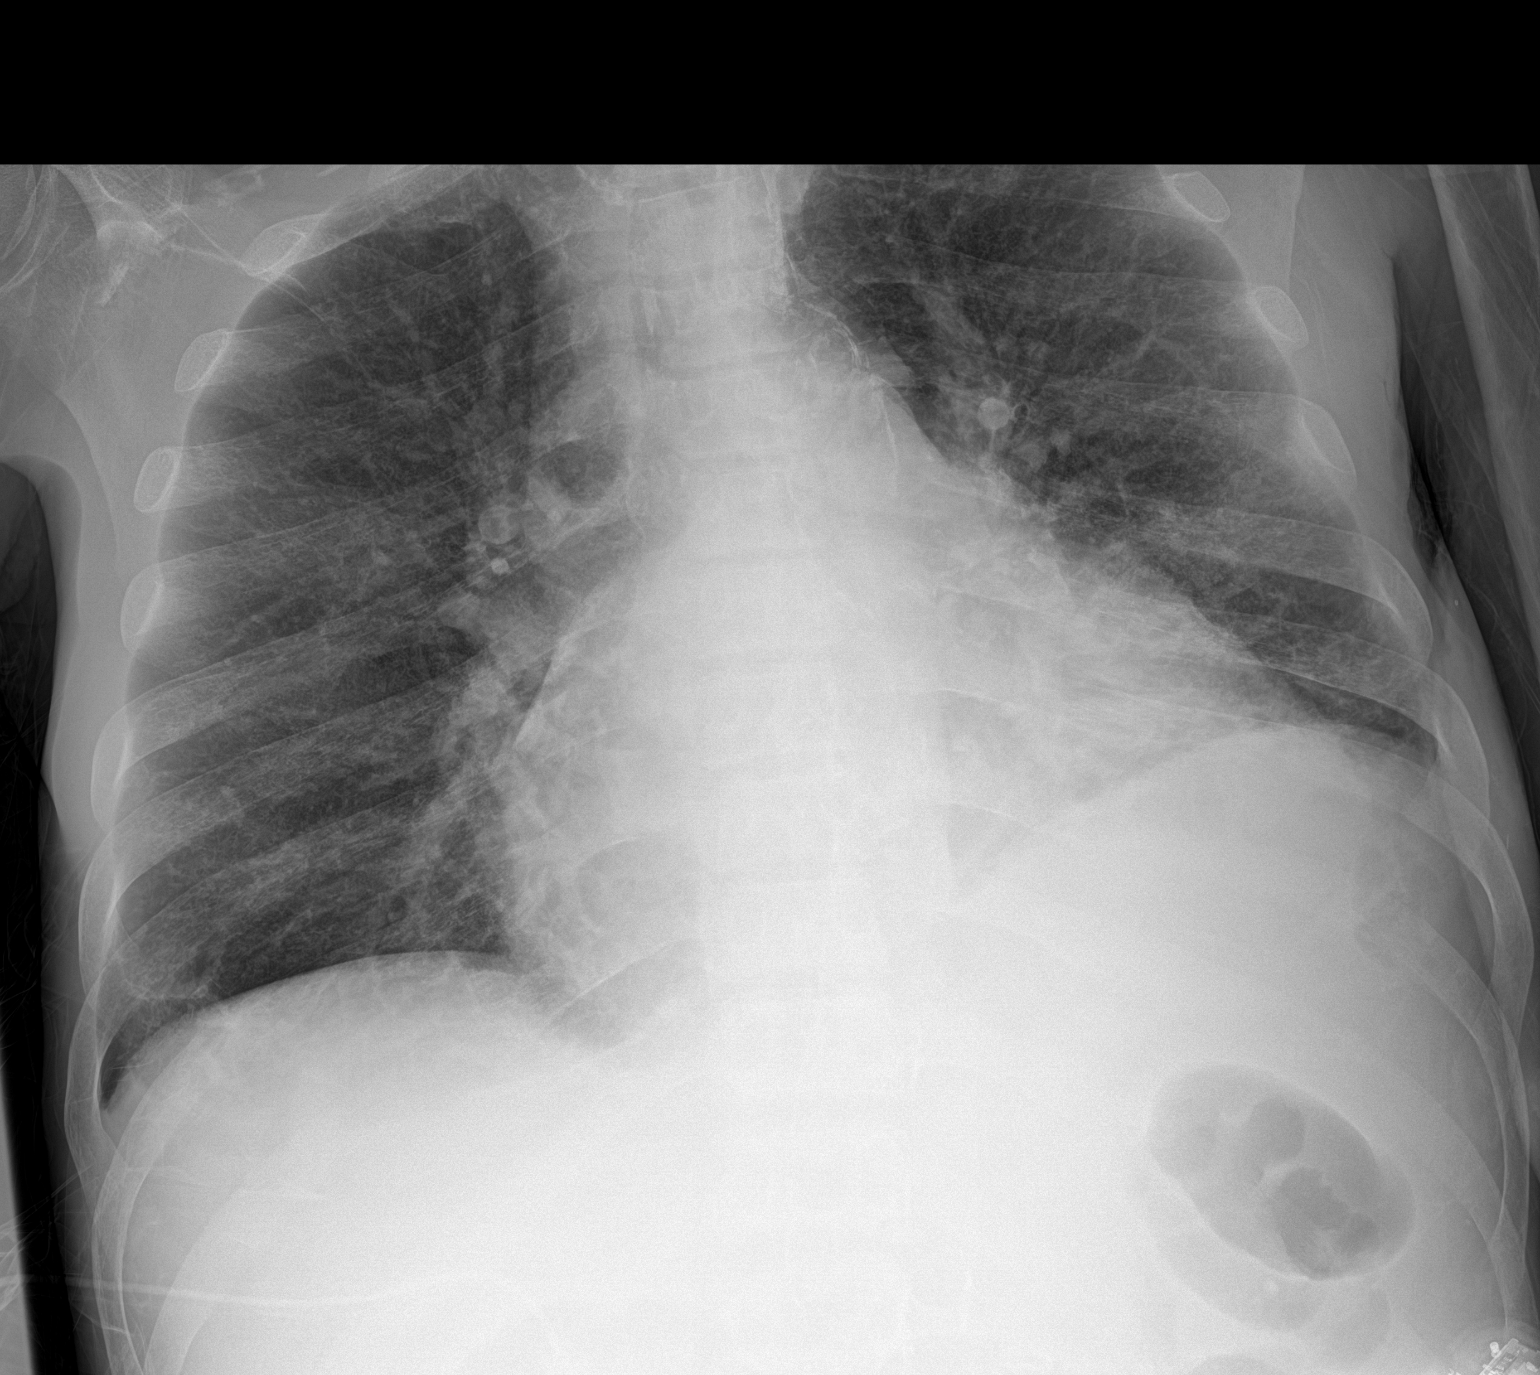

[1 of 1 positions shown; findings below may reference images not displayed]

FINDINGS: Unchanged cardiomegaly. Aortic atherosclerosis. Chronic elevation of
the left hemidiaphragm. Similar appearance of patchy interstitial
opacities most notable within the bilateral mid lung fields and left
lung base. No sizable pleural effusion or evidence of pneumothorax.
No acute bony abnormality.
IMPRESSION: Similar appearance of patchy interstitial opacities most notable
within the bilateral mid lung fields and left lung base. Findings
may reflect asymmetric edema or sequela of atypical/viral infection.

Unchanged cardiomegaly.  Aortic atherosclerosis.

## 2021-04-05 IMAGING — US IR PARACENTESIS
1 series · 1 of 1 positions shown · non-contrast
Comparison: none

INDICATION: Hepatitis C cirrhosis, end-stage renal disease, and recurrent
ascites. Request for therapeutic and diagnostic paracentesis.

[Series 1: ir (id) (id)/(id)/(id) ir · 1 of 1 slices shown]
[im 1/1]
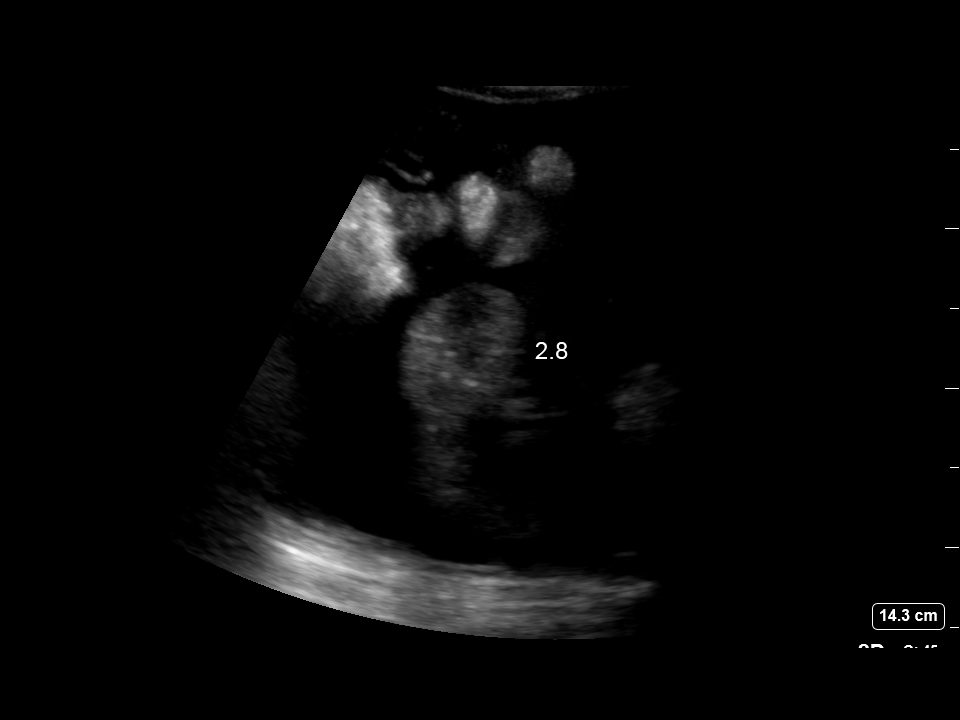

[1 of 1 positions shown; findings below may reference images not displayed]

EXAM:
ULTRASOUND GUIDED PARACENTESIS

MEDICATIONS:
1% lidocaine 15 mL

COMPLICATIONS:
None immediate.

PROCEDURE:
Informed written consent was obtained from the patient after a
discussion of the risks, benefits and alternatives to treatment. A
timeout was performed prior to the initiation of the procedure.

Initial ultrasound scanning demonstrates a moderate amount of
ascites within the left lower abdominal quadrant. The left lower
abdomen was prepped and draped in the usual sterile fashion. 1%
lidocaine was used for local anesthesia.

Following this, a 19 gauge, 7-cm, Yueh catheter was introduced. An
ultrasound image was saved for documentation purposes. The
paracentesis was performed. The catheter was removed and a dressing
was applied. The patient tolerated the procedure well without
immediate post procedural complication.

Patient received post-procedure intravenous albumin; see nursing
notes for details.
FINDINGS: A total of approximately 2.8 L of clear yellow fluid was removed.
Samples were sent to the laboratory as requested by the clinical
team.
IMPRESSION: Successful ultrasound-guided paracentesis yielding 2.8 liters of
peritoneal fluid.
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
## 2021-04-11 NOTE — Progress Notes (Signed)
Called to patient's bedside during hemodialysis.  Per staff patient suddenly became unresponsive and was in VT.  Upon my arrival the patient has expired.  He has a do not resuscitate order.    No heart tones per 2 RNs. Patient transported back to his room from hemodialysis.  Dr Candiss Norse and Dr  Hal Hope notified of patient's death.  Dr Hal Hope with notify family. Per Dr Hal Hope he has spoken with [name] who will notify additional family

## 2021-04-11 NOTE — Plan of Care (Signed)
  Problem: Education: Goal: Knowledge of General Education information will improve Description Including pain rating scale, medication(s)/side effects and non-pharmacologic comfort measures Outcome: Progressing   Problem: Health Behavior/Discharge Planning: Goal: Ability to manage health-related needs will improve Outcome: Progressing   

## 2021-04-11 NOTE — Progress Notes (Signed)
Time 1849 patient is suddenly unresponsive with VT tracing. Attempted wake the patient but still unresponsive. Vitals was checked no heart tone no chest rise. Charge nurse informed and called rapid response for support.  Patient has a DNR order.  Rapid response arrived and checked the patient. RR informed the primary doctor that patient is expired.  Primary nurse was called and report what happen and patient send back to his room.

## 2021-04-11 NOTE — Progress Notes (Signed)
[facility] Progress Note  Subjective:  Last HD on 9/12 had 2.9 kg UF.  He has gotten up today to go to the restroom x 2 per nursing  Review of systems:   Denies shortness of breath  Denies n/v No chest pain    Vitals:   03/24/21 1936 2021/03/28 0013 2021/03/28 0400 03/28/21 0834  BP: 110/73 104/77 98/74 117/73  Pulse: (!) 109 (!) 102 97 91  Resp: 13 16 18 20   Temp: 97.7 F (36.5 C) 97.8 F (36.6 C) 97.8 F (36.6 C) 97.8 F (36.6 C)  TempSrc: Oral Oral Oral Oral  SpO2: 96% 94% 95% 100%  Weight:        Exam:   General adult male seated in NAD HEENT normocephalic atraumatic  Neck supple trachea midline Lungs normal work of breathing at rest clear to auscultation  Heart S1S2 no rub Abdomen soft nontender distended Extremities no edema  Psych normal mood and affect Access:   R UE AVG+bruit    [facility] HD: [facility]   4h 450/1.5  69kg   2/2 bath  Hep none  R AVG Calcitriol 1.6mcg PO qHD-last dose 03/11/21 Sensipar 30mg  3x/weekly-last dose 03/09/21   Assessment/Plan:  COVID+ - CXR looks like PNA and/ or edema. S/p remdesivir per primary team. S/p unasyn   Chronic hypotension - chronic soft BP's, on amio / Cardizem for afib rate control and on midodrine 10 tid.  Acute hypoxic resp. Failure - secondary to covid and overload.  Some of the extra wt is recurrent ascites. Paracentesis per primary team discretion. Oxygen support as needed. Optimize volume with HD  ESRD - HD per MWF schedule    Anemia of ESRD: has not needed ESA as OP of late; may dose tomorrow if trend continues; for CBC in am  Metabolic Bone Disease: resumed calcitriol; on sensipar as well as renvela  Altered MS-  no acute intracranial process on head CT   Afib - on amio and dilt per primary  Disposition per primary team.  Discussed with HD SW to notify unit of his status.  Recent Labs  Lab 03/20/21 0630 03/21/21 0043 03/23/21 1634 03/23/21 2231 03/24/21 0235  K 4.1   < > 2.0* 3.7 3.6   BUN 21*   < > 8  --  29*  CREATININE 4.96*   < > 2.15*  --  5.70*  CALCIUM 8.5*   < > 8.1*  --  7.1*  PHOS 4.7*  --   --   --   --   HGB 9.8*   < > 10.6*  --  9.5*   < > = values in this interval not displayed.   Inpatient medications:  albuterol  2 puff Inhalation Q6H   amiodarone  200 mg Oral q AM   calcitRIOL  1.5 mcg Oral Daily   Chlorhexidine Gluconate Cloth  6 each Topical Q0600   cinacalcet  60 mg Oral BID WC   diltiazem  30 mg Oral Q6H   heparin  5,000 Units Subcutaneous Q8H   lactulose  20 g Oral Daily   midodrine  10 mg Oral Q8H   pantoprazole  40 mg Oral Q1200   sevelamer carbonate  3,200 mg Oral TID WC    sodium chloride     sodium chloride     sodium chloride, sodium chloride, acetaminophen, alteplase, haloperidol lactate, heparin, lidocaine (PF), lidocaine-prilocaine, metoprolol tartrate, naLOXone (NARCAN)  injection, pentafluoroprop-tetrafluoroeth, polyethylene glycol, sevelamer carbonate   Claudia Desanctis, MD  12:05 PM 2021-04-11
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
## 2021-04-11 NOTE — Progress Notes (Signed)
Hospitalized at current time.

## 2021-04-11 DEATH — deceased

## 2021-04-20 IMAGING — DX DG CHEST 1V PORT
1 series · 1 of 1 positions shown · non-contrast
Comparison: Radiograph 10/04/2019

CLINICAL DATA: Abdominal pain.

EXAM:
PORTABLE CHEST 1 VIEW

[chest ap]
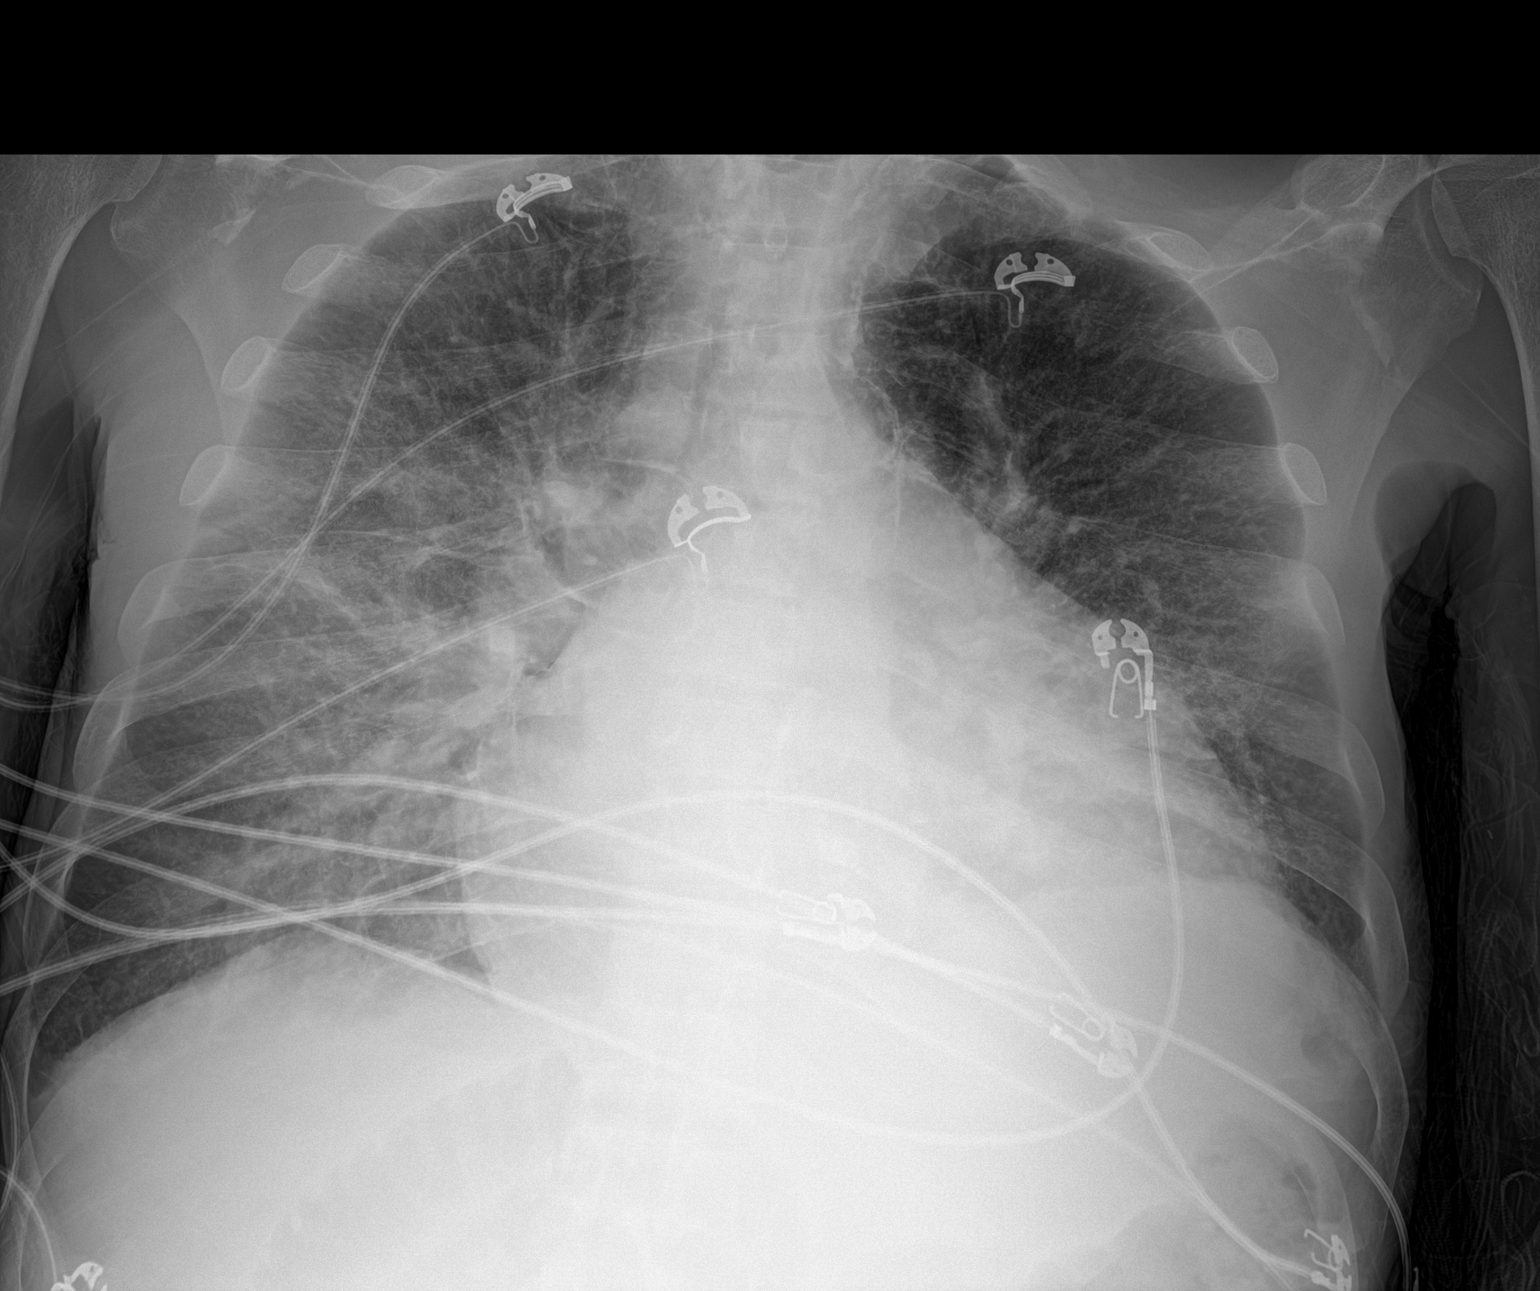

[1 of 1 positions shown; findings below may reference images not displayed]

FINDINGS: Enlarged cardiac silhouette similar prior. There is new fine
airspace disease in the RIGHT perihilar lung. Mild airspace disease
in the LEFT lung. No focal consolidation. No pneumothorax.
IMPRESSION: Cardiomegaly and fine perihilar airspace suggest pulmonary edema.
Pneumonia less favored. Airspace disease is asymmetric, greater on
the RIGHT.

## 2021-04-21 IMAGING — DX DG CHEST 1V PORT
1 series · 1 of 1 positions shown · non-contrast
Comparison: Four hundred ten 21

CLINICAL DATA: Abdominal painsob

EXAM:
PORTABLE CHEST 1 VIEW

[chest]
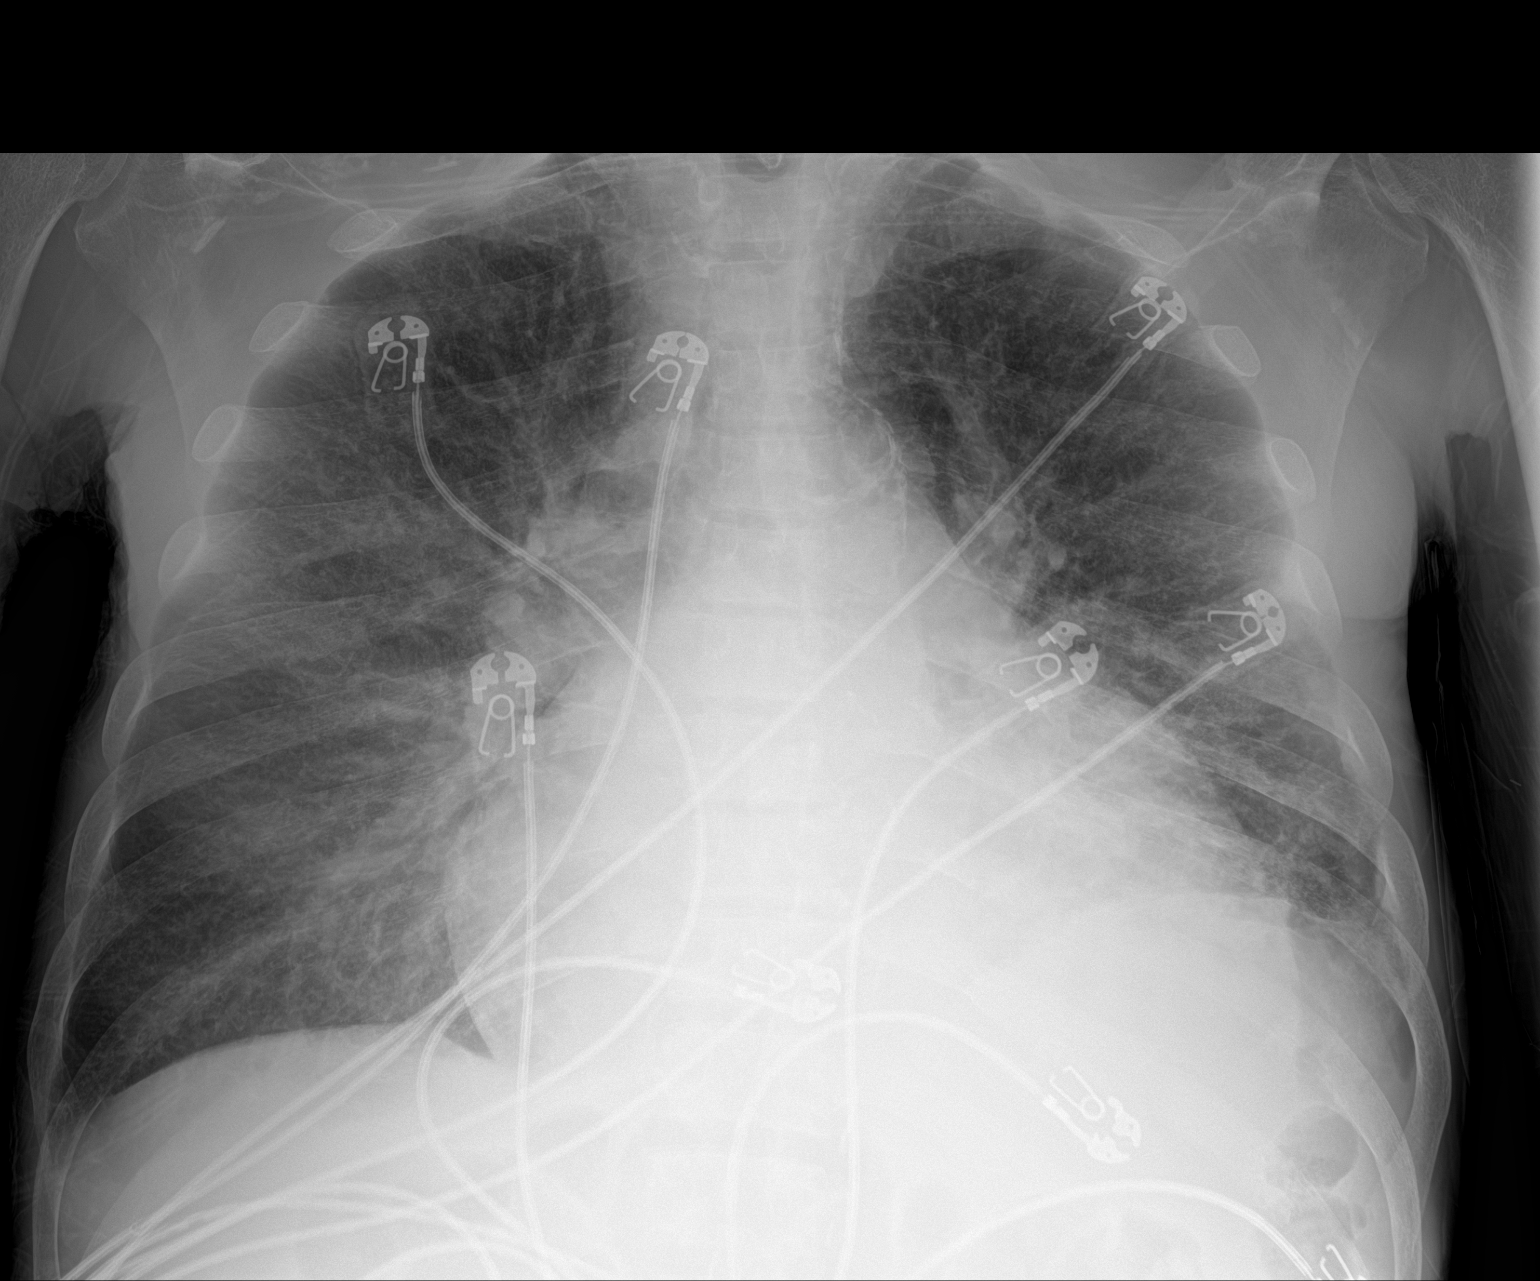

[1 of 1 positions shown; findings below may reference images not displayed]

FINDINGS: Stable enlarged cardiac silhouette. Perihilar airspace disease
slightly improved. Elevation LEFT hemidiaphragm. No pleural fluid no
pneumothorax. Vascular calcification in the RIGHT subclavian artery.
IMPRESSION: Slight improvement in pulmonary edema pattern.

Stable cardiomegaly

## 2021-04-29 IMAGING — US IR PARACENTESIS
1 series · 2 of 2 positions shown · non-contrast
Comparison: none

INDICATION: End-stage renal disease on hemodialysis. Recurrent ascites. Request
for therapeutic paracentesis.

[Series 1: (id) · 2 of 2 slices shown]
[im 1/2]
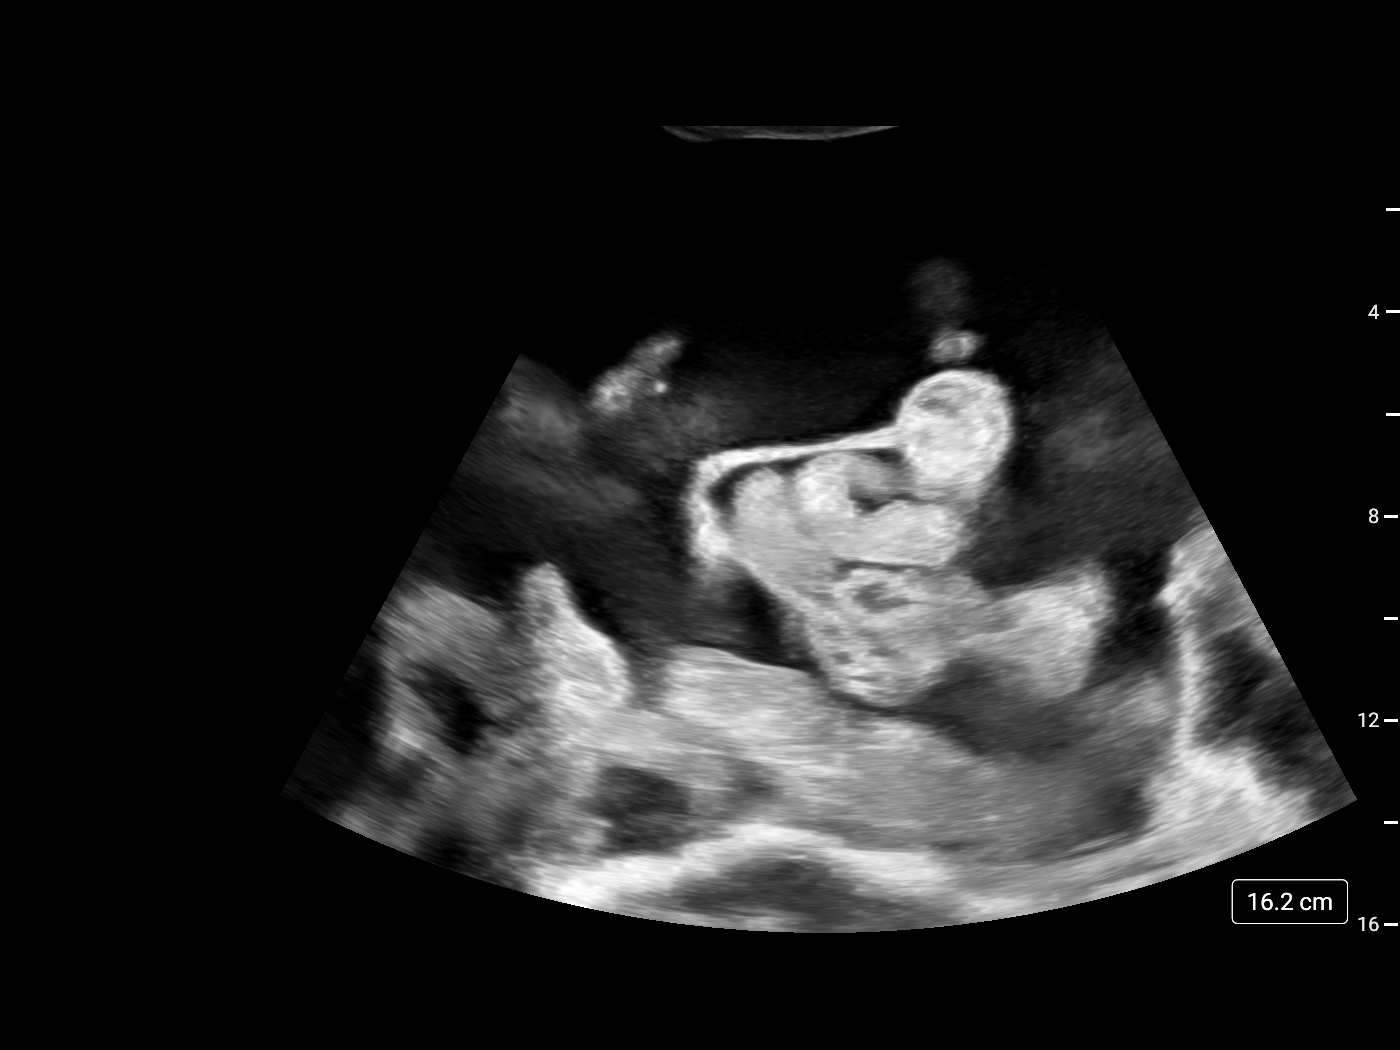
[im 2/2]
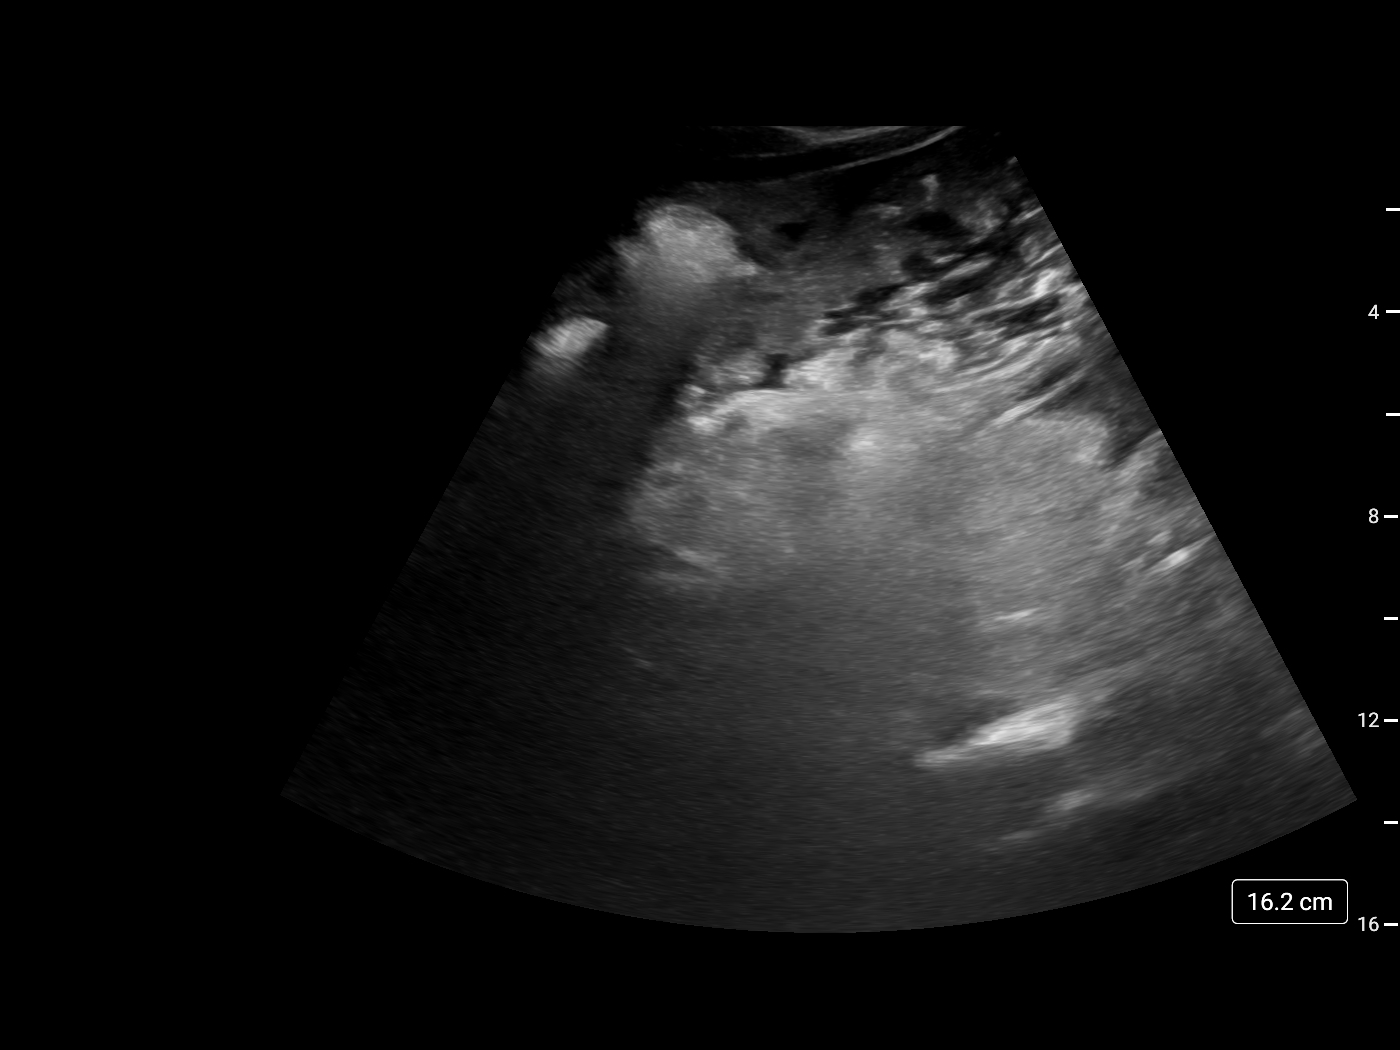

[2 of 2 positions shown; findings below may reference images not displayed]

EXAM:
ULTRASOUND GUIDED LEFT LOWER QUADRANT PARACENTESIS

MEDICATIONS:
None.

COMPLICATIONS:
None immediate.

PROCEDURE:
Informed written consent was obtained from the patient after a
discussion of the risks, benefits and alternatives to treatment. A
timeout was performed prior to the initiation of the procedure.

Initial ultrasound scanning demonstrates a large amount of ascites
within the left lower abdominal quadrant. The left lower abdomen was
prepped and draped in the usual sterile fashion. 1% lidocaine was
used for local anesthesia.

Following this, a 19 gauge, 7-cm, Yueh catheter was introduced. An
ultrasound image was saved for documentation purposes. The
paracentesis was performed. The catheter was removed and a dressing
was applied. The patient tolerated the procedure well without
immediate post procedural complication.
FINDINGS: A total of approximately 2.5 L of clear yellow fluid was removed.
IMPRESSION: Successful ultrasound-guided paracentesis yielding 2.5 liters of
peritoneal fluid.

## 2021-05-13 IMAGING — DX DG CHEST 1V PORT
1 series · 1 of 1 positions shown · non-contrast
Comparison: 10/21/2019

CLINICAL DATA: Tachycardia

EXAM:
PORTABLE CHEST 1 VIEW

[chest]
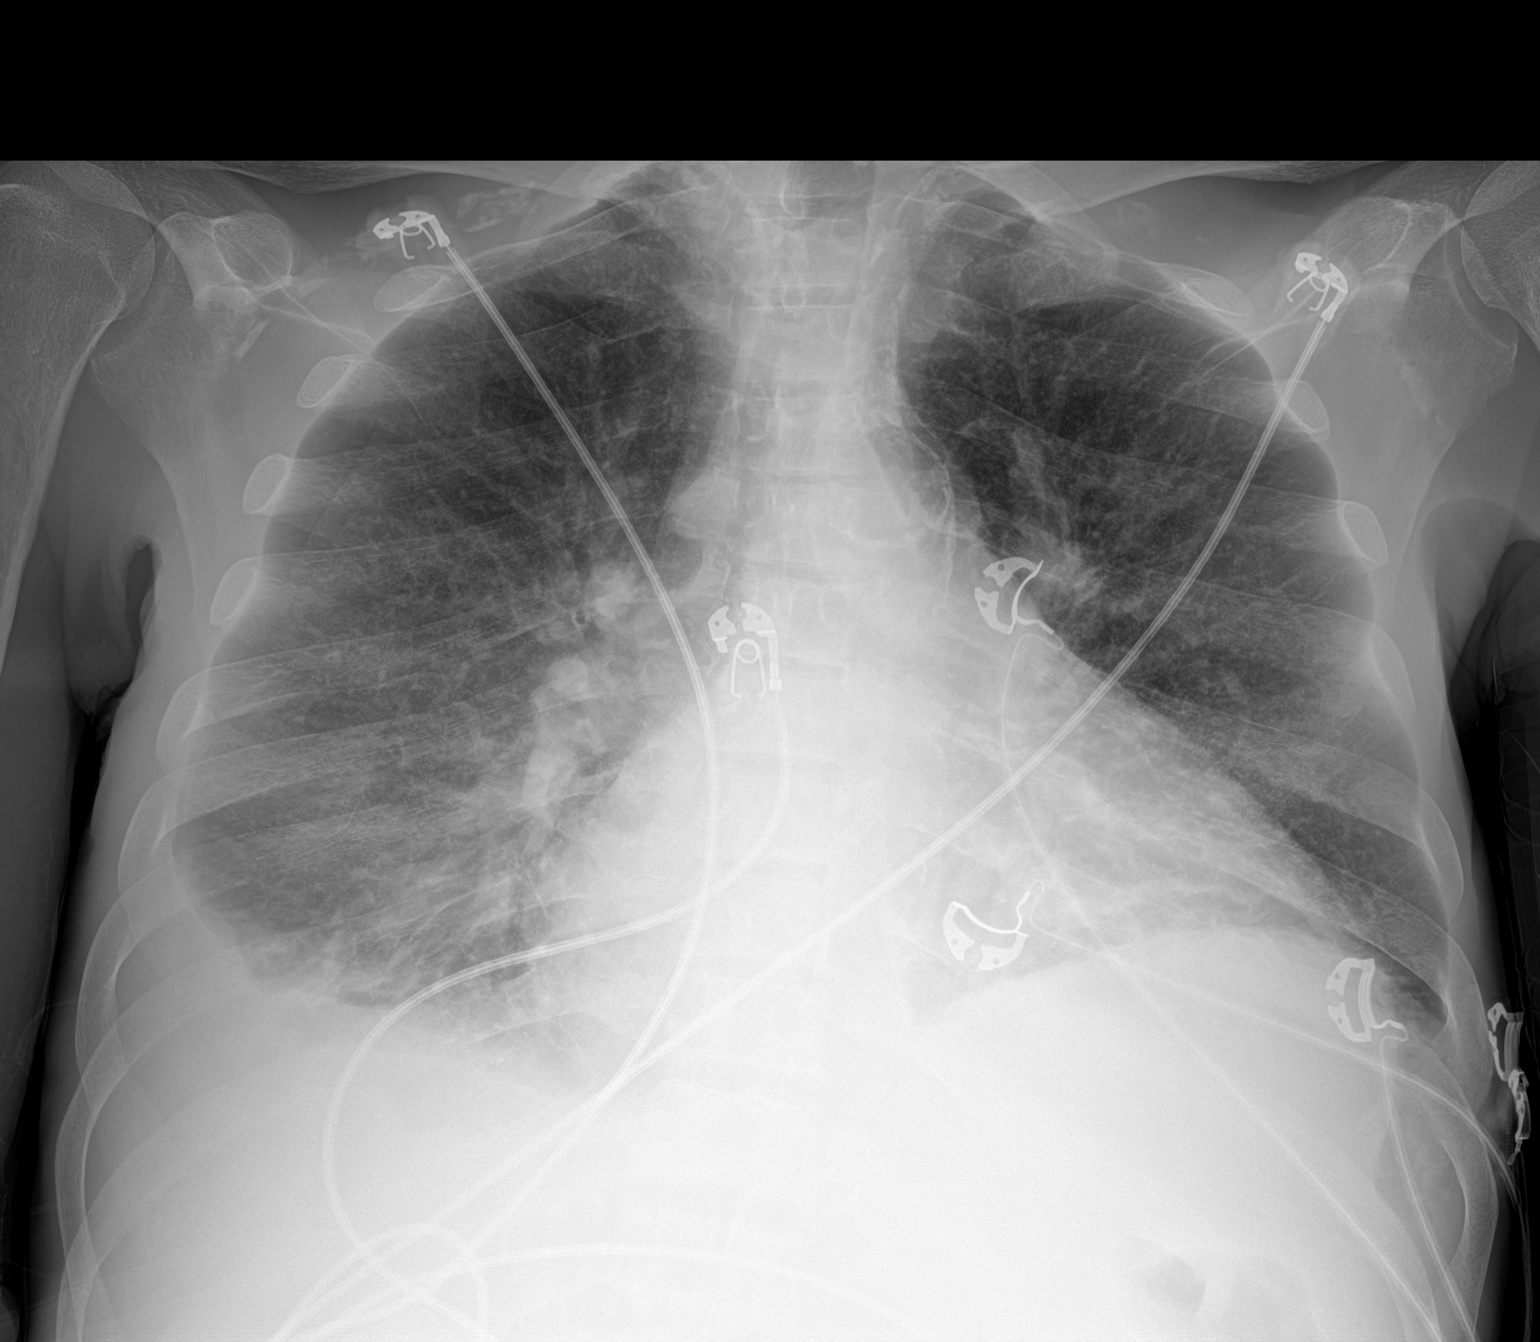

[1 of 1 positions shown; findings below may reference images not displayed]

FINDINGS: Cardiomegaly with small right-sided pleural effusion. Vascular
congestion. Diffuse bilateral ground-glass opacity likely pulmonary
edema. No pneumothorax. Aortic atherosclerosis and carotid vascular
calcification.
IMPRESSION: 1. Cardiomegaly with vascular congestion and mild pulmonary edema.
2. Small right-sided pleural effusion, new or increased compared to
10/21/2019.

## 2021-05-15 IMAGING — DX DG CHEST 1V PORT
1 series · 1 of 1 positions shown · non-contrast
Comparison: 11/12/2019

CLINICAL DATA: Shortness of breath for 1 week.

EXAM:
PORTABLE CHEST 1 VIEW

[chest ap]
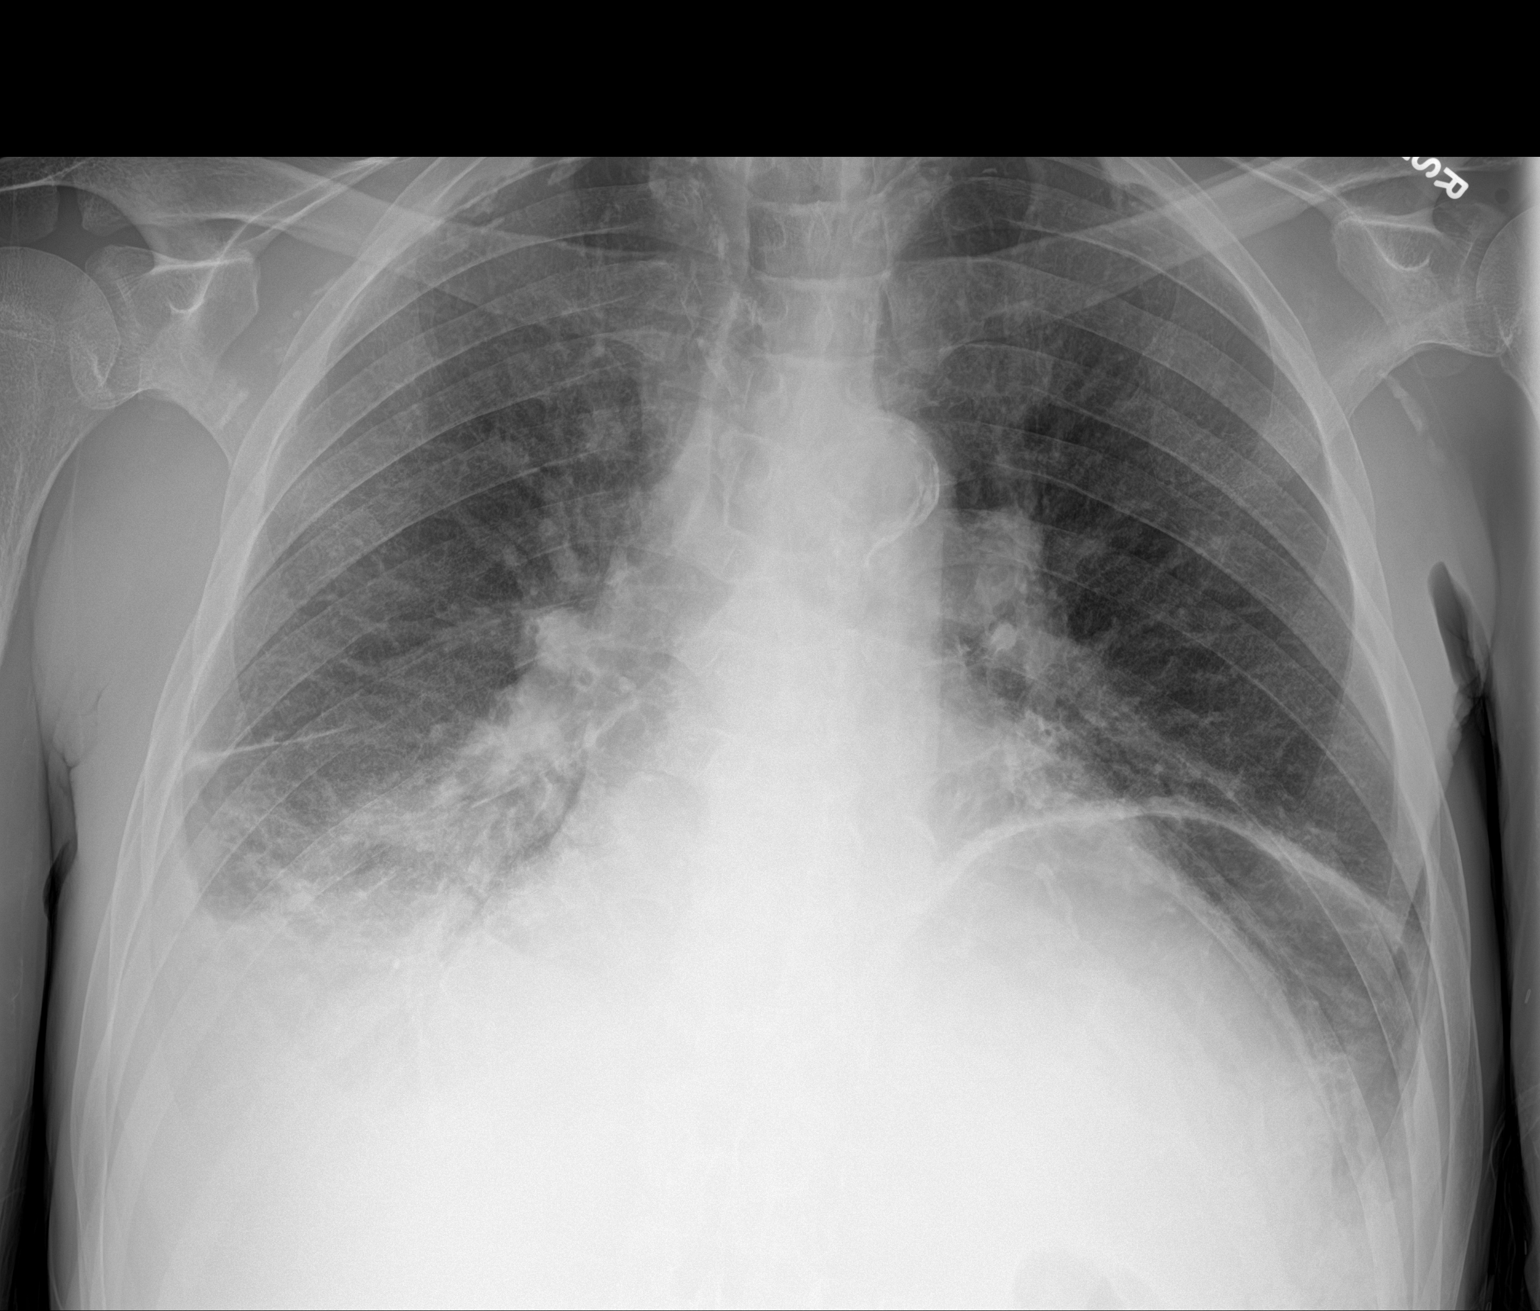

[1 of 1 positions shown; findings below may reference images not displayed]

FINDINGS: 3335 hours. Low lung volumes. The cardio pericardial silhouette is
enlarged. Interstitial markings are diffusely coarsened with chronic
features. Interval progression of right base collapse/consolidation
with persistent small right pleural effusion. Lucency under the left
hemidiaphragm likely related to large gastric bubble. Bones are
diffusely demineralized.
IMPRESSION: Interval progression of right base collapse/consolidation with
persistent small right pleural effusion.

Lucency under the left hemidiaphragm likely related to large gastric
bubble. Intraperitoneal considered much less likely, but if there is
clinical concern for bowel perforation, two-view abdomen could be
used to further evaluate.

## 2021-05-25 ENCOUNTER — Other Ambulatory Visit: Payer: Self-pay | Admitting: Student

## 2021-05-25 DIAGNOSIS — I480 Paroxysmal atrial fibrillation: Secondary | ICD-10-CM

## 2021-05-26 IMAGING — DX DG CHEST 1V PORT
1 series · 1 of 1 positions shown · non-contrast
Comparison: 11/14/2019

CLINICAL DATA: Shortness of breath. Technologist notes state V-tach
with pulses.

EXAM:
PORTABLE CHEST 1 VIEW

[chest]
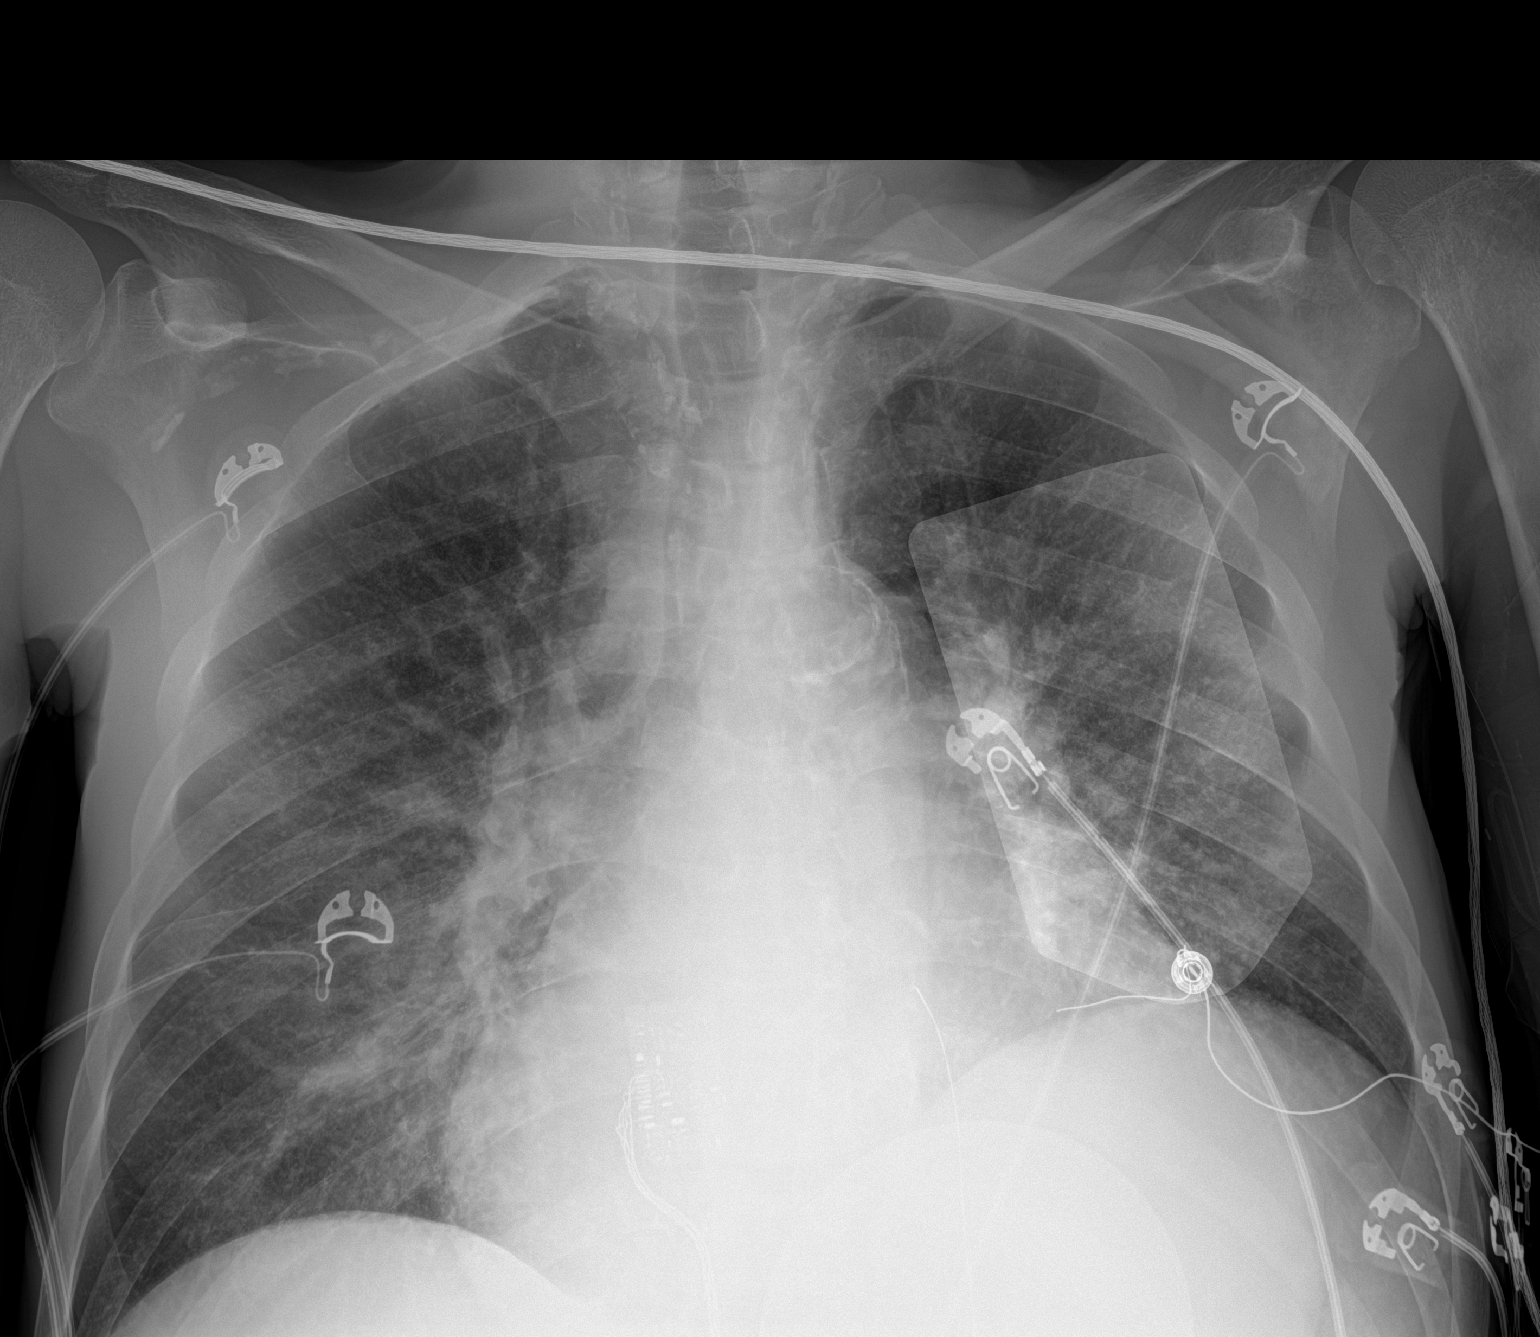

[1 of 1 positions shown; findings below may reference images not displayed]

FINDINGS: Chronic cardiomegaly. Aortic atherosclerosis. Improved bibasilar
aeration and decreased pleural effusions from prior exam. Pulmonary
edema which is slightly worsened. Streaky right infrahilar
opacities. No pneumothorax. Carotid and subclavian vascular
calcifications.
IMPRESSION: 1. Increased pulmonary edema over the last 10 days.
2. However improved bibasilar aeration and decreased pleural
effusions from prior exam.
3. Streaky right infrahilar opacities may be atelectasis, pneumonia
or aspiration.
4. Chronic cardiomegaly.  Aortic Atherosclerosis ([id number]).

## 2021-06-11 ENCOUNTER — Ambulatory Visit: Payer: Medicare Other | Admitting: Family Medicine

## 2021-06-12 IMAGING — US IR PARACENTESIS
1 series · 3 of 3 positions shown · non-contrast
Comparison: none

INDICATION: Patient with history of cirrhosis, ascites. Request is made for
diagnostic and therapeutic paracentesis.

[Series 1: ir (id) (id)/(id)/(id) ir · 3 of 3 slices shown]
[im 1/3]
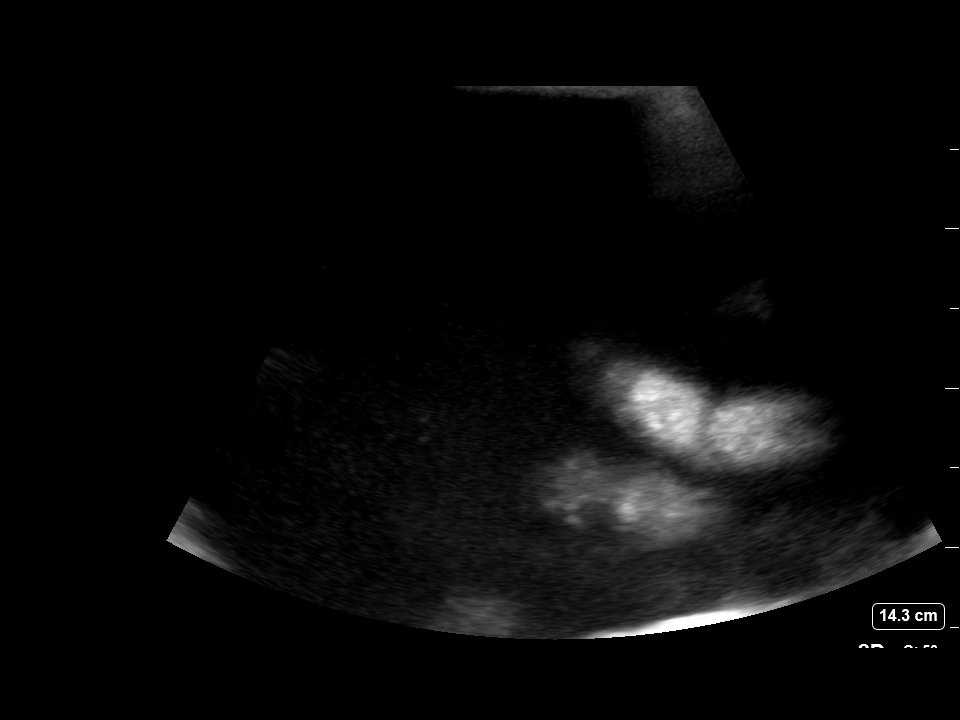
[im 2/3]
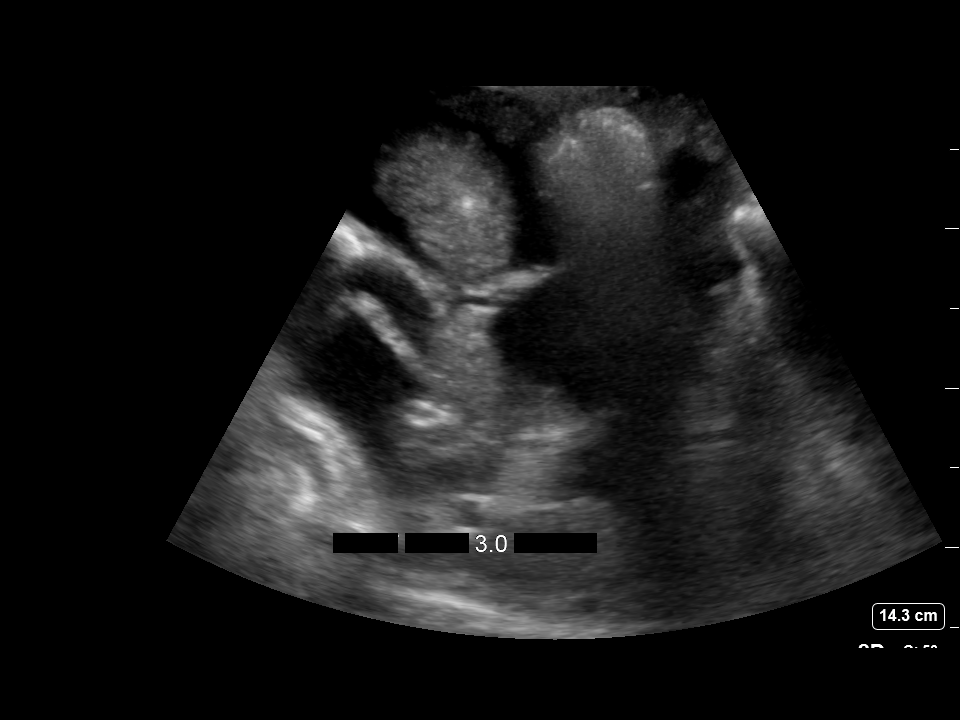
[im 3/3]
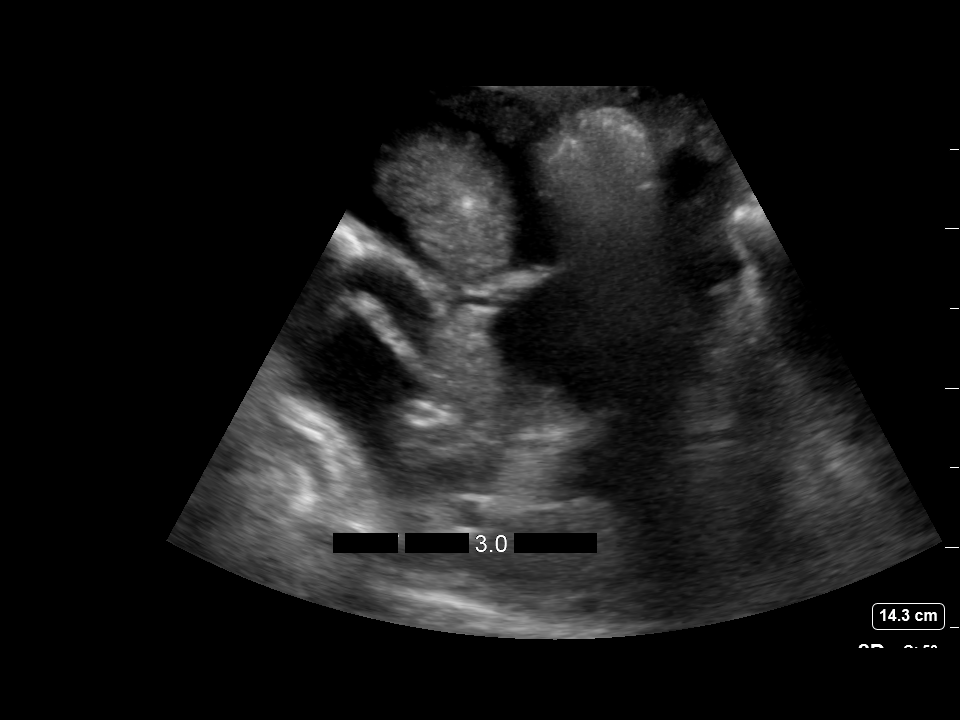

[3 of 3 positions shown; findings below may reference images not displayed]

EXAM:
ULTRASOUND GUIDED DIAGNOSTIC AND THERAPEUTIC PARACENTESIS

MEDICATIONS:
10 mL 1% lidocaine

COMPLICATIONS:
None immediate.

PROCEDURE:
Informed written consent was obtained from the patient after a
discussion of the risks, benefits and alternatives to treatment. A
timeout was performed prior to the initiation of the procedure.

Initial ultrasound scanning demonstrates a moderate amount of
ascites within the left lateral abdomen. The left lateral abdomen
was prepped and draped in the usual sterile fashion. 1% lidocaine
with epinephrine was used for local anesthesia.

Following this, a 19 gauge, 7-cm, Yueh catheter was introduced.
After about 400 mL were removed the catheter became lodged against
bowel and flow could not be restored despite multiple attempts to
dislodge the catheter. The Yueh catheter was removed and a
Safe-T-Centesis was introduced. The paracentesis was performed. The
catheter was removed and a dressing was applied. The patient
tolerated the procedure well without immediate post procedural
complication.
FINDINGS: A total of approximately 3.0 liters of bloody fluid was removed.
Samples were sent to the laboratory as requested by the clinical
team.
IMPRESSION: Successful ultrasound-guided diagnostic and therapeutic paracentesis
yielding 3.0 liters of peritoneal fluid.

## 2021-06-18 IMAGING — DX DG CHEST 1V PORT
1 series · 1 of 1 positions shown · non-contrast
Comparison: 12/12/2019

CLINICAL DATA: Short of breath, abdominal distension

EXAM:
PORTABLE CHEST 1 VIEW

[chest ap]
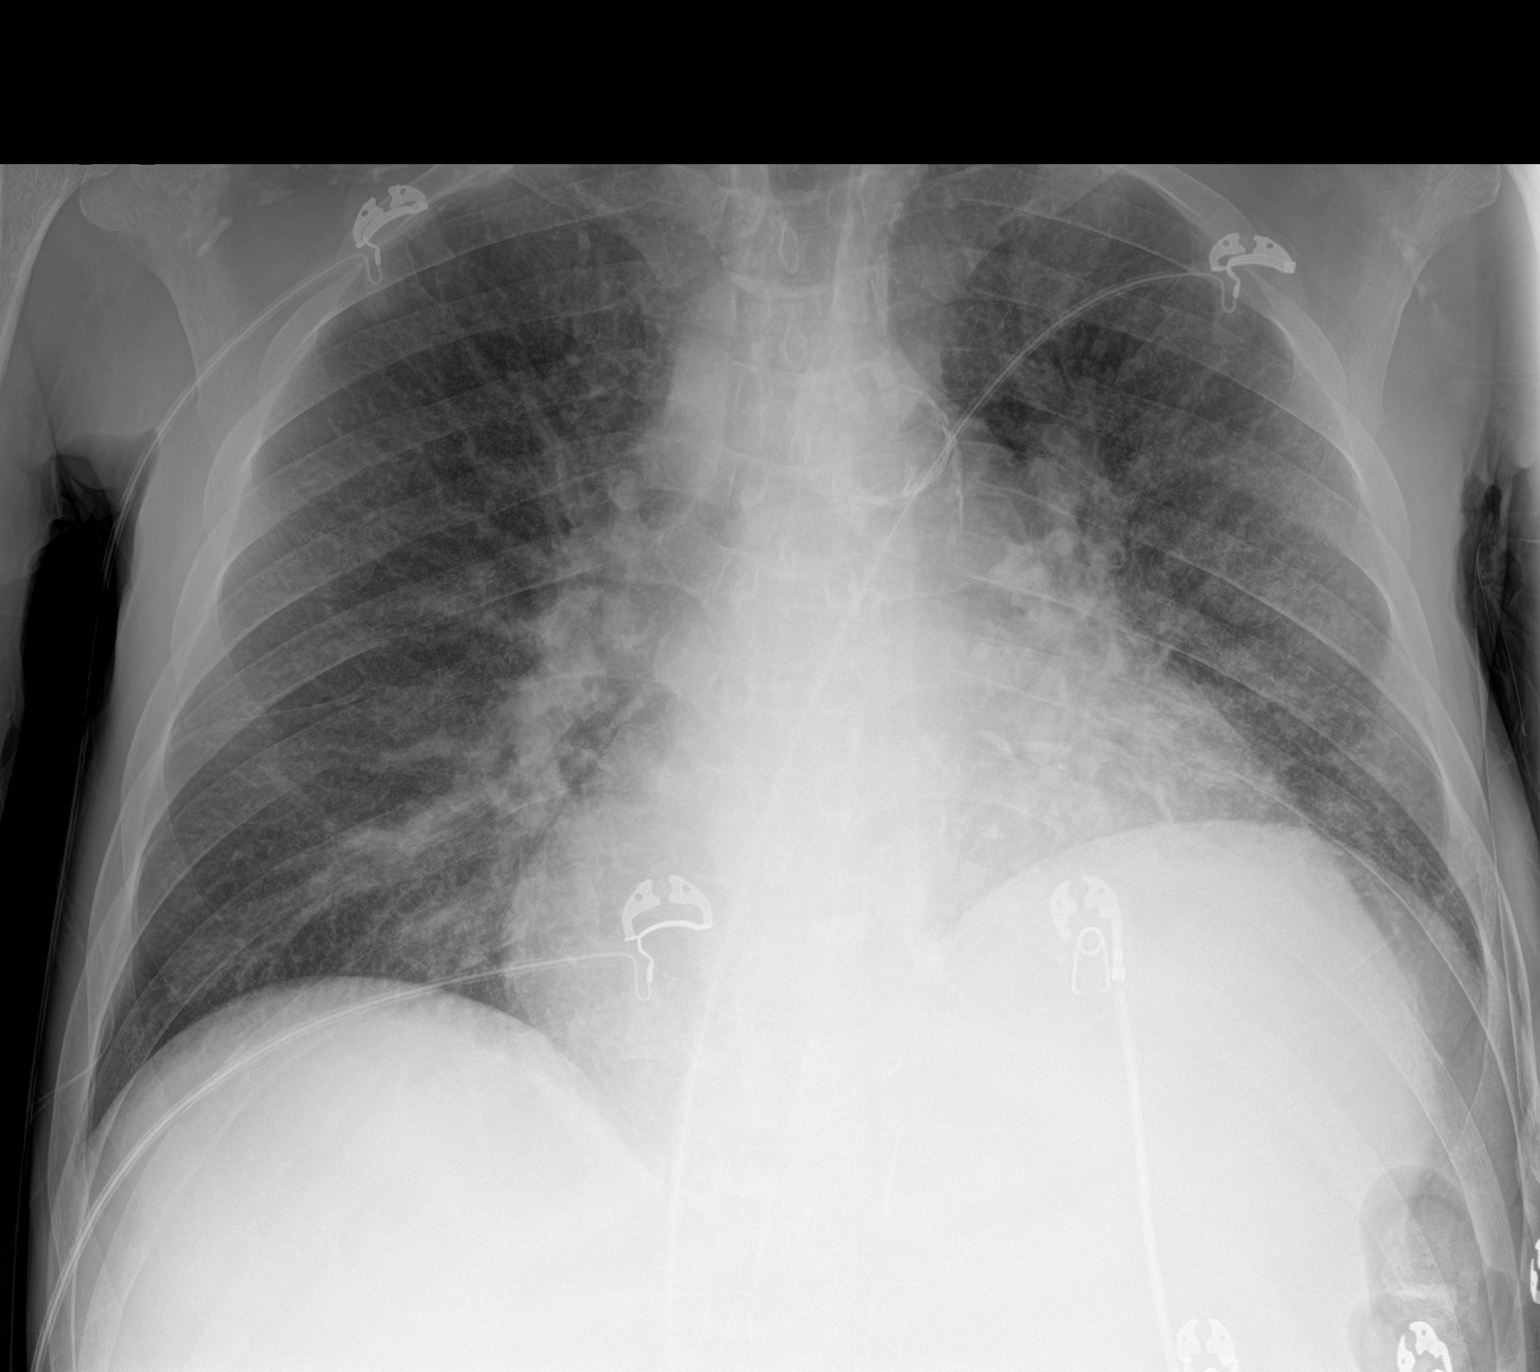

[1 of 1 positions shown; findings below may reference images not displayed]

FINDINGS: Single frontal view of the chest demonstrates an enlarged cardiac
silhouette. There is central vascular congestion with bilateral
ground-glass airspace disease, left greater than right, favor edema.
No effusion or pneumothorax. Chronic elevation left hemidiaphragm.
IMPRESSION: 1. Interstitial and alveolar opacities compatible with pulmonary
edema.

## 2021-06-18 IMAGING — DX DG ABD PORTABLE 1V
1 series · 1 of 1 positions shown · non-contrast
Comparison: 09/30/2019

CLINICAL DATA: Abdominal distension, recent paracentesis

EXAM:
PORTABLE ABDOMEN - 1 VIEW

[abdomen kub]
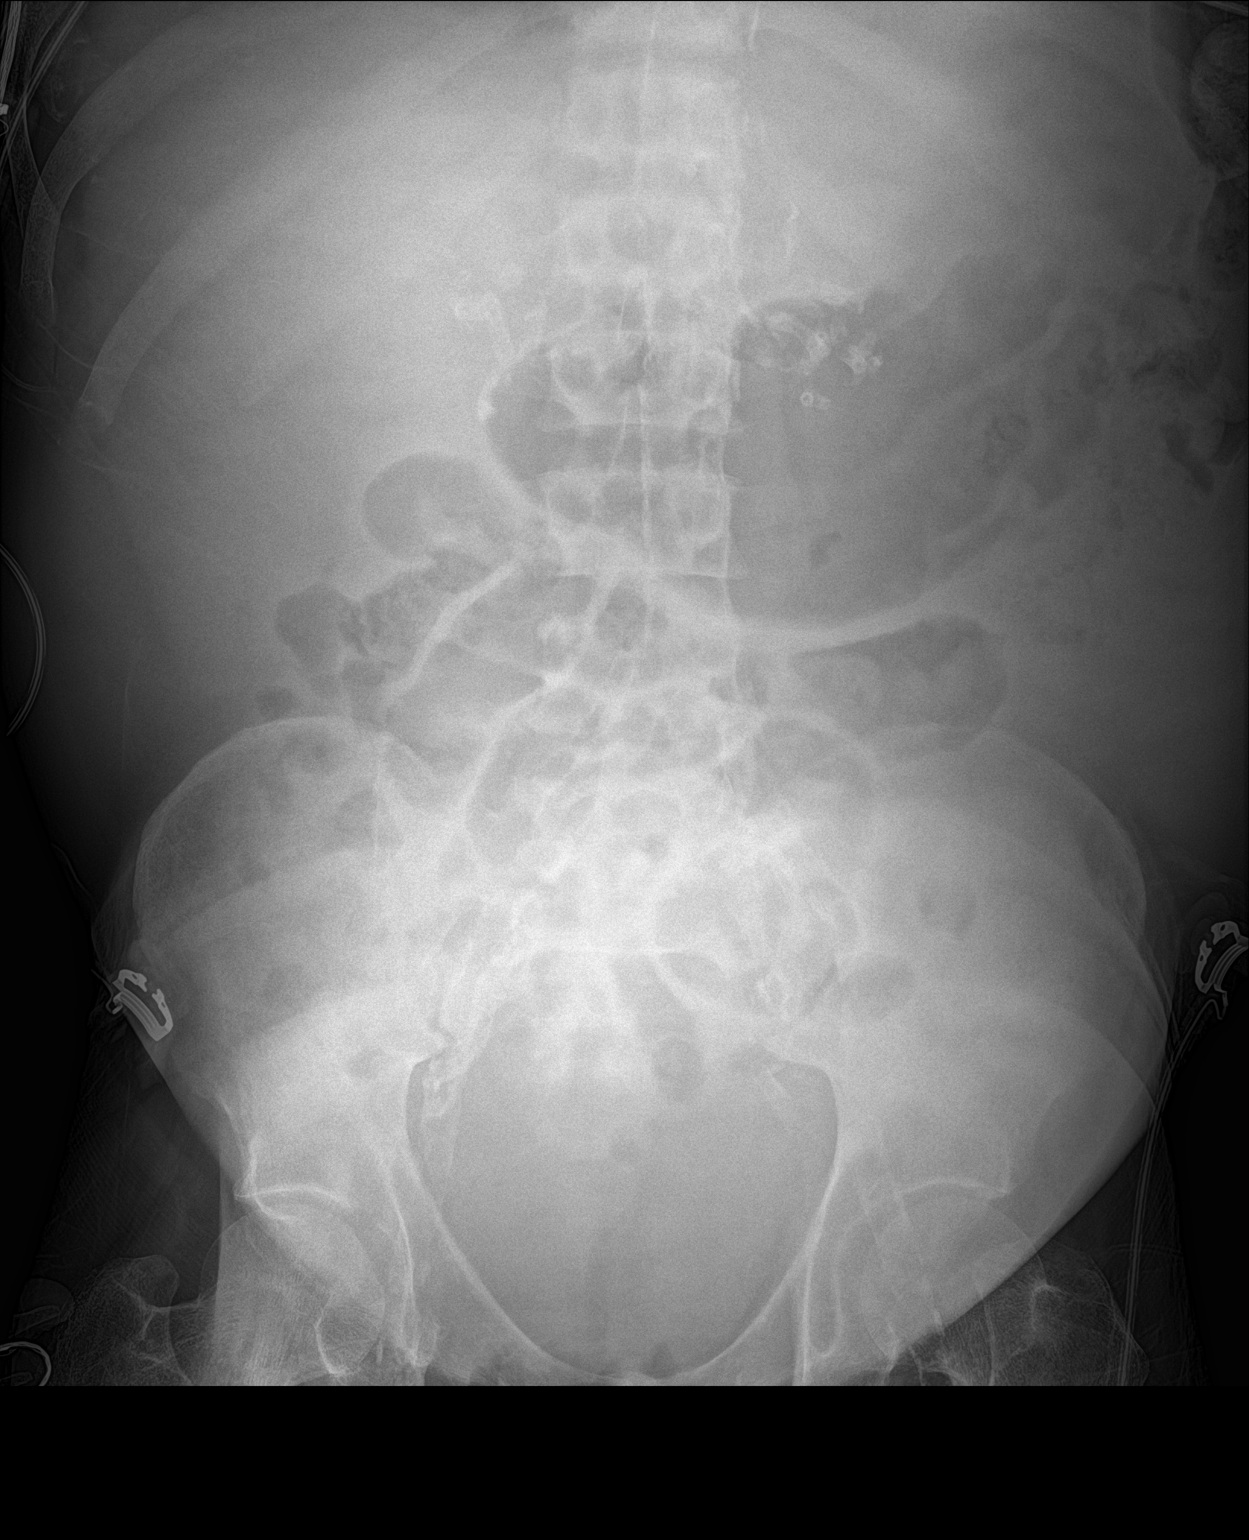

[1 of 1 positions shown; findings below may reference images not displayed]

FINDINGS: Supine frontal view of the abdomen and pelvis demonstrate
centralization of the bowel gas loops consistent with ascites. No
obstruction or ileus. No masses or abnormal calcifications.
Extensive atherosclerosis.
IMPRESSION: 1. Findings compatible with ascites.
2. No evidence of obstruction or ileus.

## 2021-06-21 IMAGING — DX DG CHEST 1V PORT
1 series · 1 of 1 positions shown · non-contrast
Comparison: December 18, 2019

CLINICAL DATA: Shortness of breath.  End-stage renal disease

EXAM:
PORTABLE CHEST 1 VIEW

[chest]
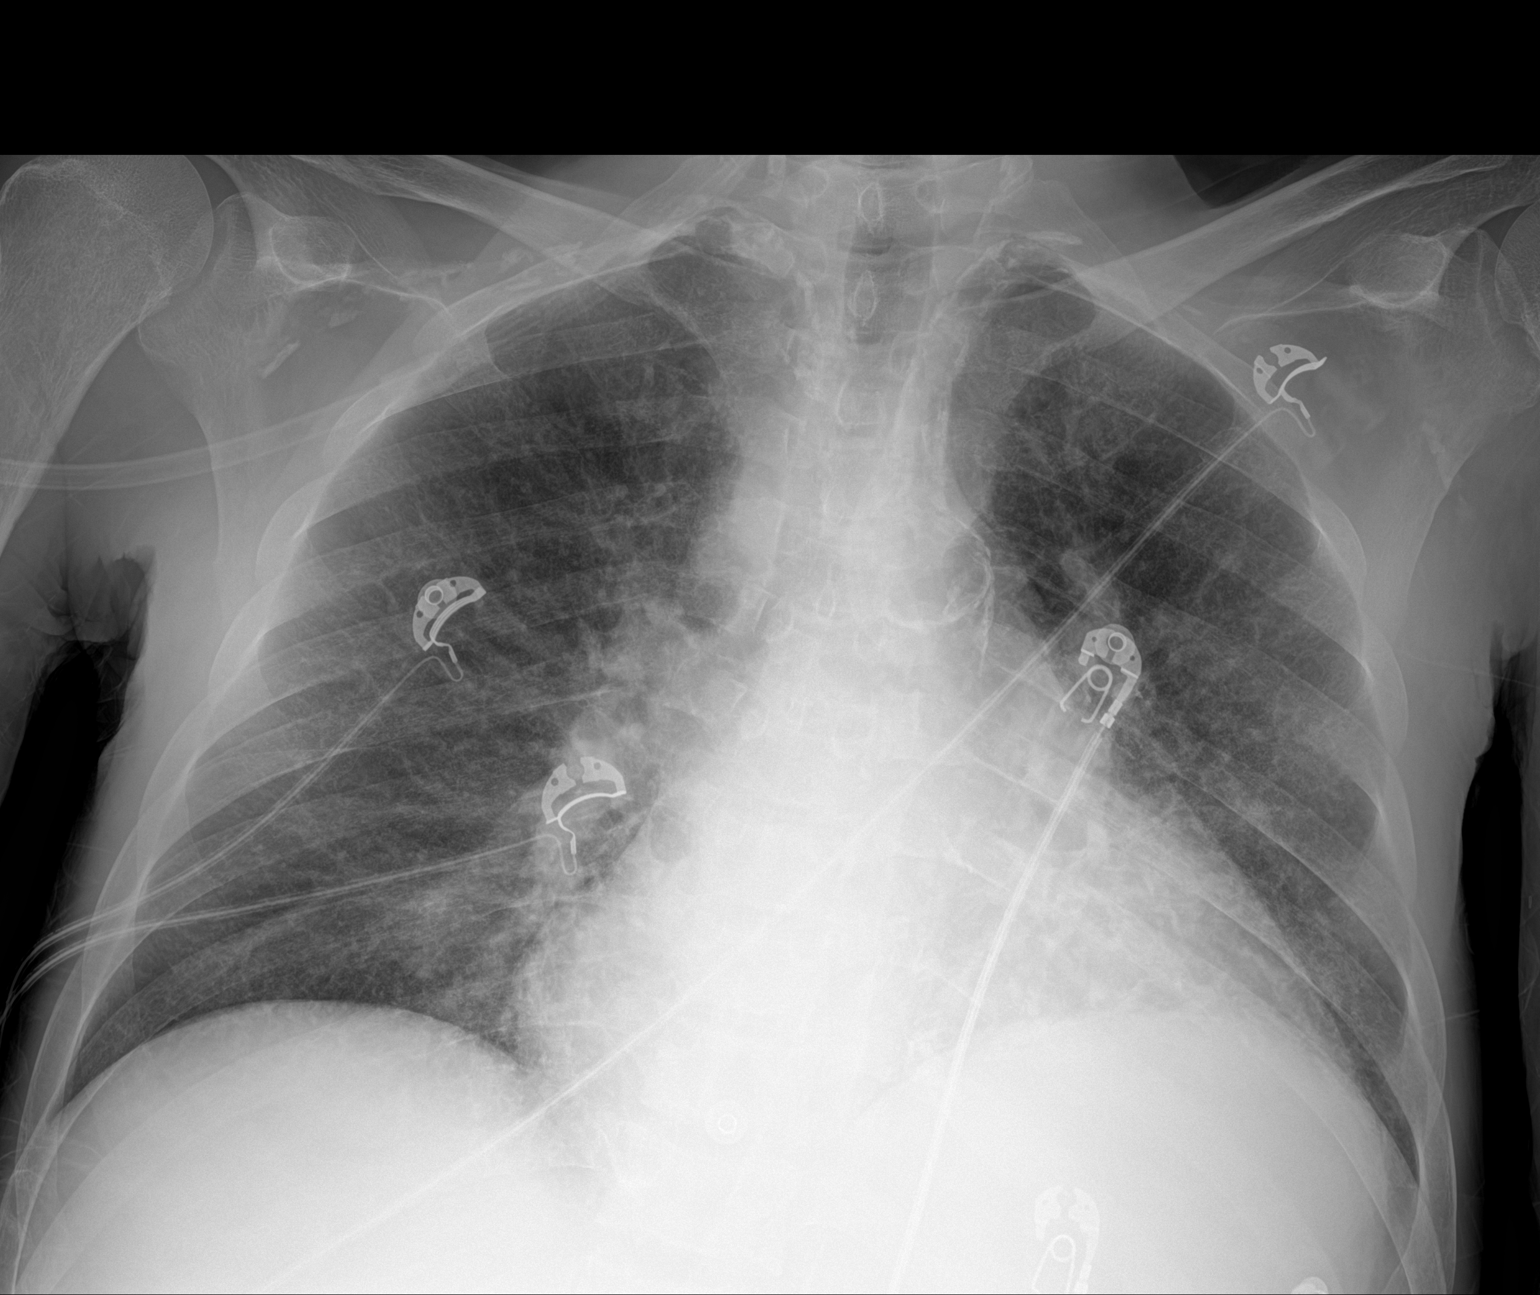

[1 of 1 positions shown; findings below may reference images not displayed]

FINDINGS: There is ill-defined opacity in portions of each mid and lower lung
region, similar to recent study, which is felt to represent a degree
of interstitial edema. No consolidation. There is cardiomegaly with
a degree of pulmonary venous hypertension. No adenopathy. There is
aortic atherosclerosis. There is calcification in each carotid
artery an each subclavian artery. No adenopathy. No bone lesion.
IMPRESSION: Apparent interstitial edema in the mid and lower lung regions,
similar to recent study. There is cardiomegaly with a degree of
pulmonary vascular congestion. Suspect congestive heart failure. No
consolidation evident.

Extensive atherosclerotic calcification at multiple sites as noted
above.

Aortic Atherosclerosis ([id number].W).

## 2021-06-22 IMAGING — CT CT RENAL STONE PROTOCOL
2 of 4 series · 16 of 46 positions shown, 18 images · non-contrast
Comparison: 05/07/2019

CLINICAL DATA: Flank pain

EXAM:
CT ABDOMEN AND PELVIS WITHOUT CONTRAST
TECHNIQUE: Multidetector CT imaging of the abdomen and pelvis was performed
following the standard protocol without IV contrast.

[Series 3: stone study 5.0 i30f 2 · axial · 0.77mm/px · z∈[+800,+1255]mm · 13 of 101 slices shown, 15 images]
[im 5/101  soft-tissue]
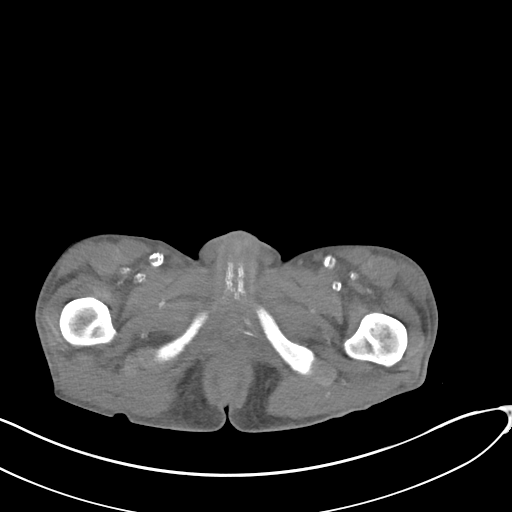
[im 5/101  bone]
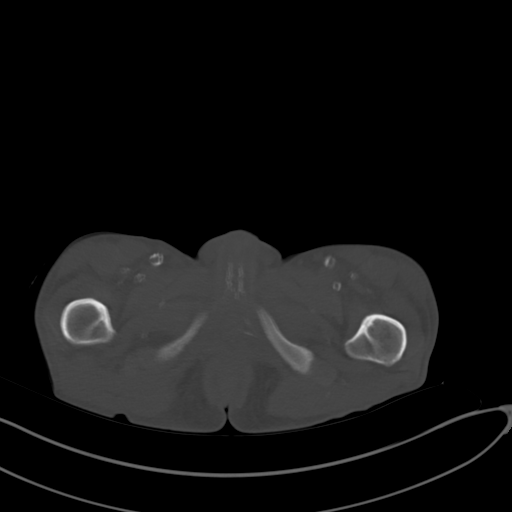
[im 13/101  soft-tissue]
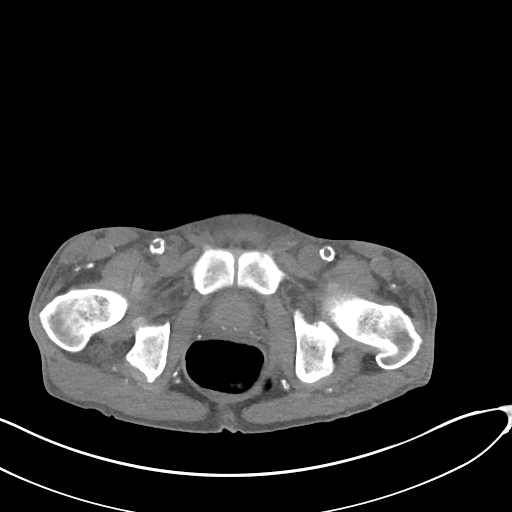
[im 21/101  soft-tissue]
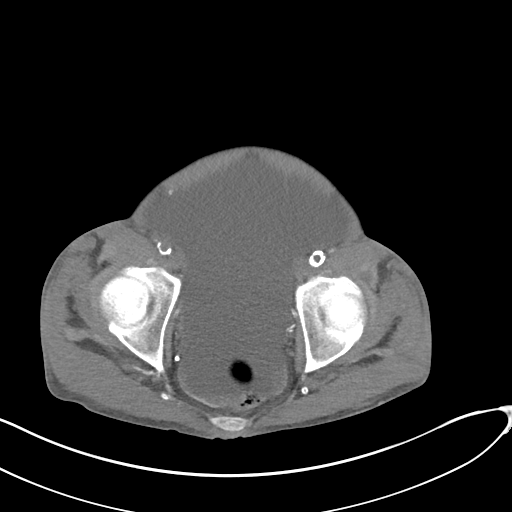
[im 30/101  soft-tissue]
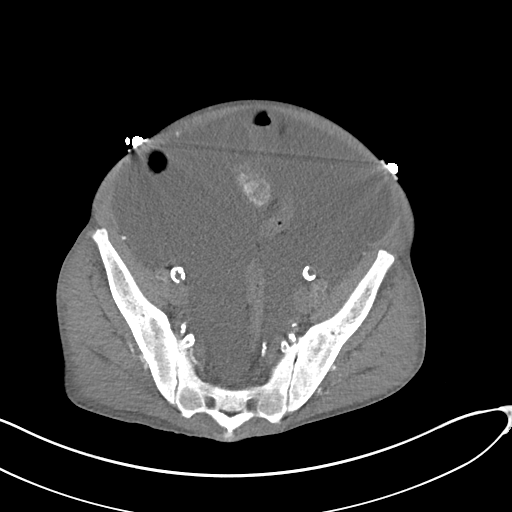
[im 34/101  soft-tissue]
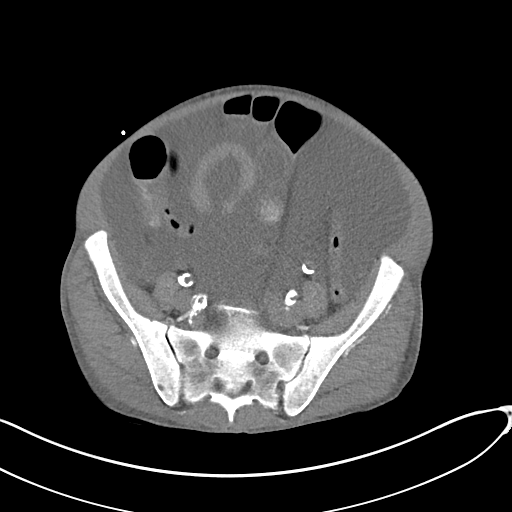
[im 42/101  soft-tissue]
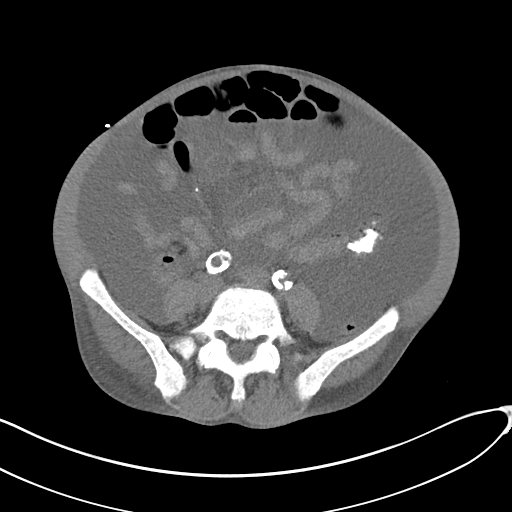
[im 51/101  soft-tissue]
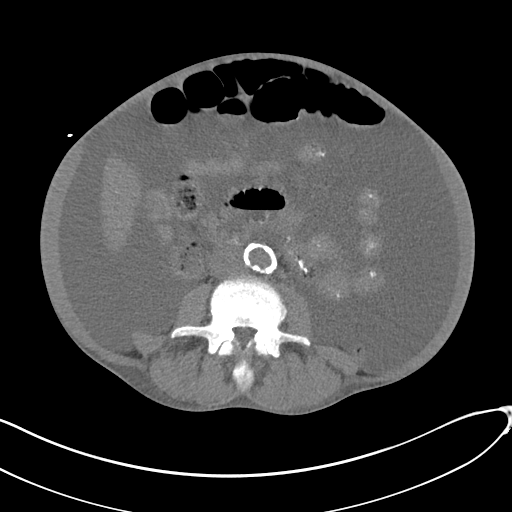
[im 59/101  soft-tissue]
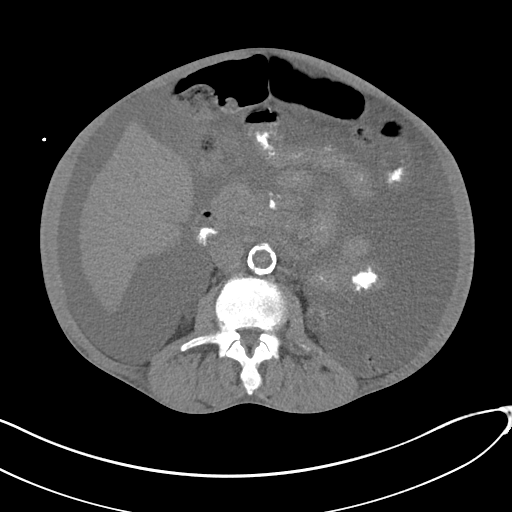
[im 67/101  soft-tissue]
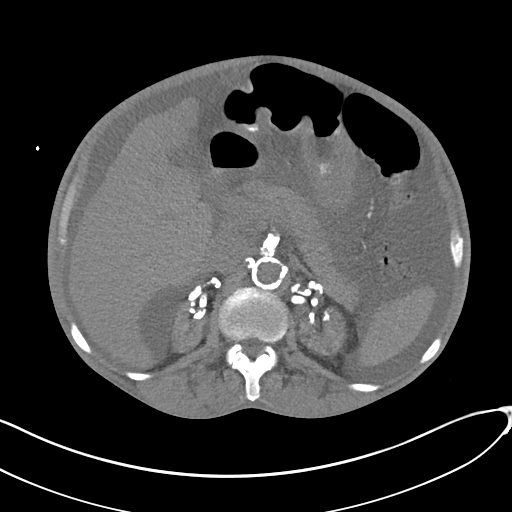
[im 67/101  bone]
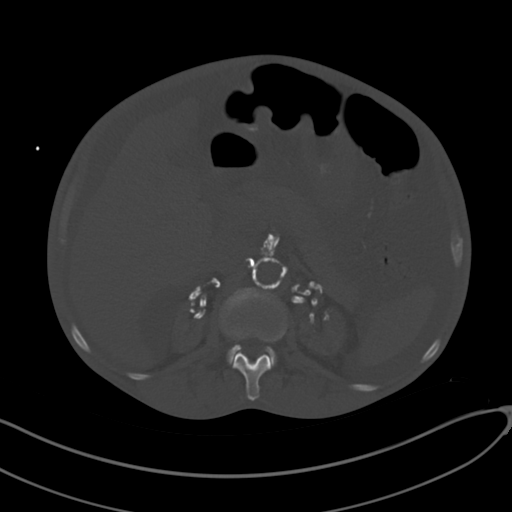
[im 71/101  soft-tissue]
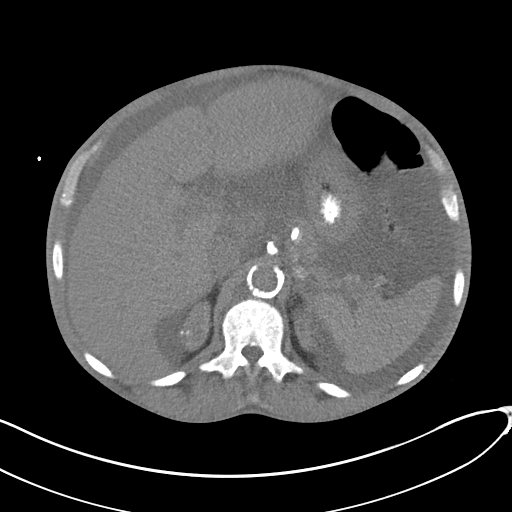
[im 80/101  soft-tissue]
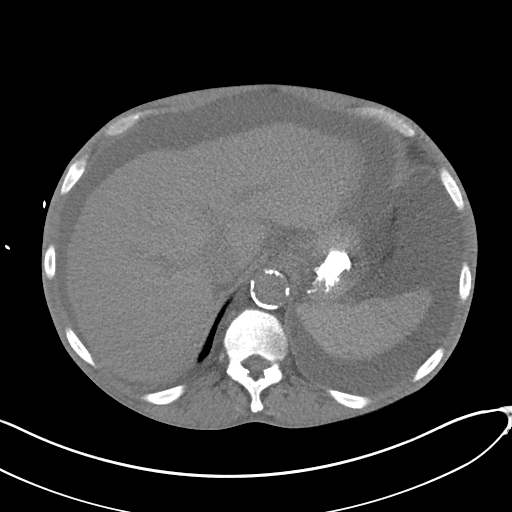
[im 88/101  soft-tissue]
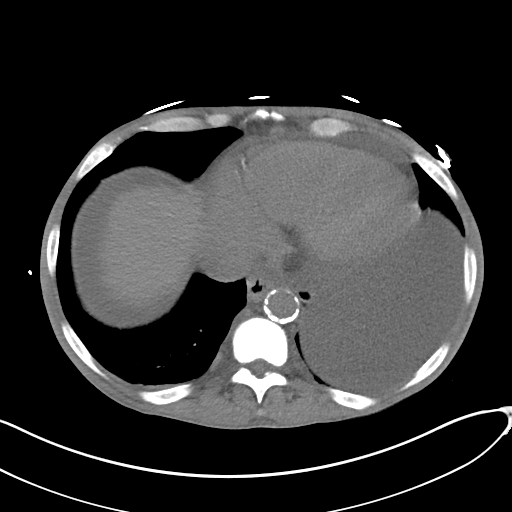
[im 96/101  soft-tissue]
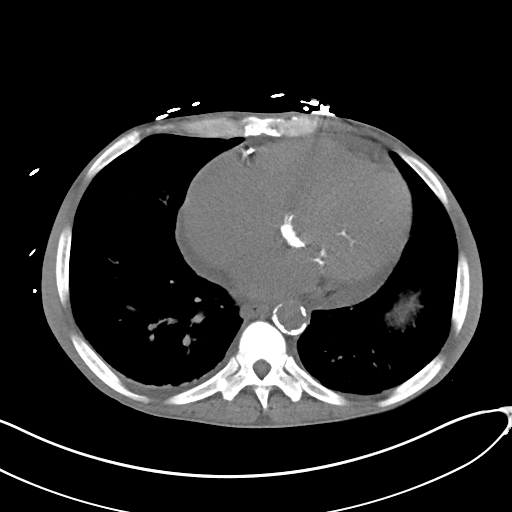

[Series 6: coronal soft tissue · coronal · 0.94mm/px · 3 of 123 slices shown]
[im 41/123  soft-tissue]
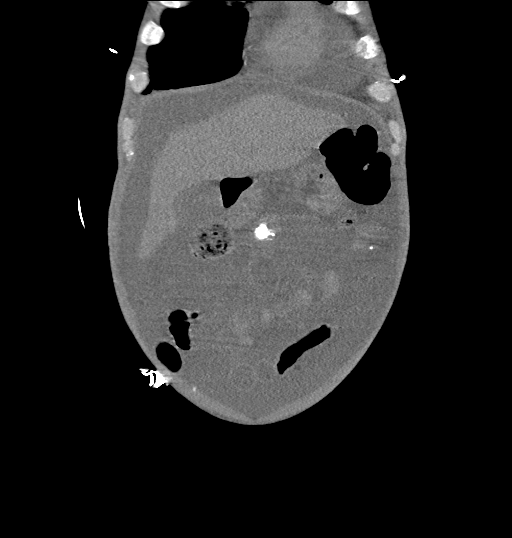
[im 55/123  soft-tissue]
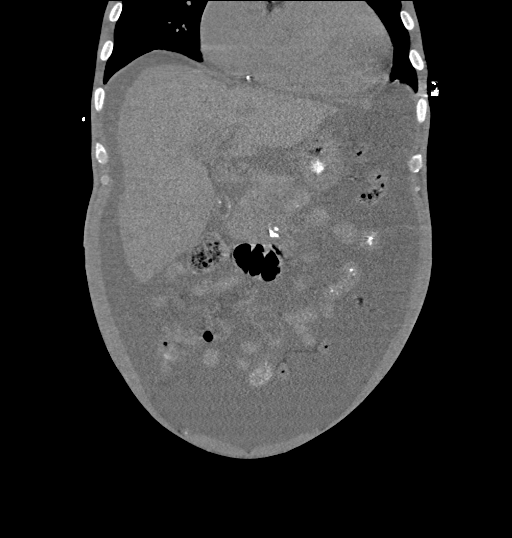
[im 68/123  soft-tissue]
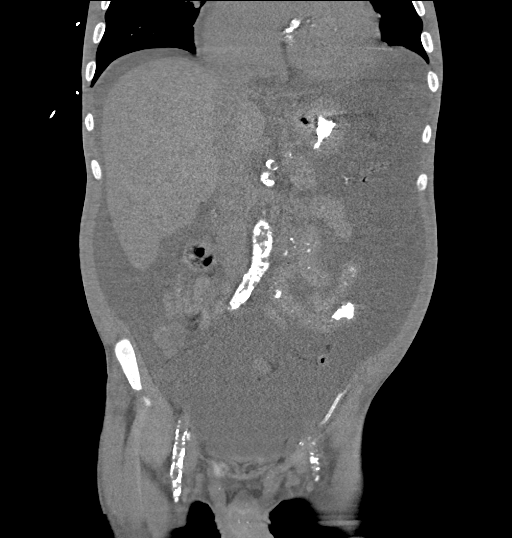

[16 of 46 positions shown; findings below may reference images not displayed]

FINDINGS: Lower chest: There are small bilateral pleural effusions.The heart
size is significantly enlarged. There is a small to moderate-sized
pericardial effusion. Advanced coronary artery calcifications are
noted. There is some atelectasis at the lung bases.

Hepatobiliary: The liver is normal. Normal gallbladder.There is no
biliary ductal dilation.

Pancreas: Normal contours without ductal dilatation. No
peripancreatic fluid collection.

Spleen: Unremarkable.

Adrenals/Urinary Tract:

--Adrenal glands: Unremarkable.

--Right kidney/ureter: The right kidney is atrophic.

--Left kidney/ureter: Left kidney is atrophic.

--Urinary bladder: Bladder is decompressed and therefore poorly
evaluated.

Stomach/Bowel:

--Stomach/Duodenum: No hiatal hernia or other gastric abnormality.
Normal duodenal course and caliber.

--Small bowel: Unremarkable.

--Colon: Unremarkable.

--Appendix: Normal.

Vascular/Lymphatic: Atherosclerotic calcification is present within
the non-aneurysmal abdominal aorta, without hemodynamically
significant stenosis.

--No retroperitoneal lymphadenopathy.

--No mesenteric lymphadenopathy.

--No pelvic or inguinal lymphadenopathy.

Reproductive: Unremarkable

Other: There is a large volume of abdominal ascites. There is body
wall edema.

Musculoskeletal. Renal osteodystrophy is noted.
IMPRESSION: 1. Large volume of abdominal ascites.
2. Small bilateral pleural effusions and small to moderate-sized
pericardial effusion.
3. Cardiomegaly with advanced coronary artery calcifications.
4. Atrophic kidneys.

Aortic Atherosclerosis (EI4ZS-AH8.8).

## 2021-06-22 IMAGING — US US PARACENTESIS
1 series · 2 of 2 positions shown · non-contrast
Comparison: none

INDICATION: Patient with history of cirrhosis with recurrent ascites presents
for diagnostic and therapeutic paracentesis

[Series 1: us paracentesis · 2 of 2 slices shown]
[im 1/2]
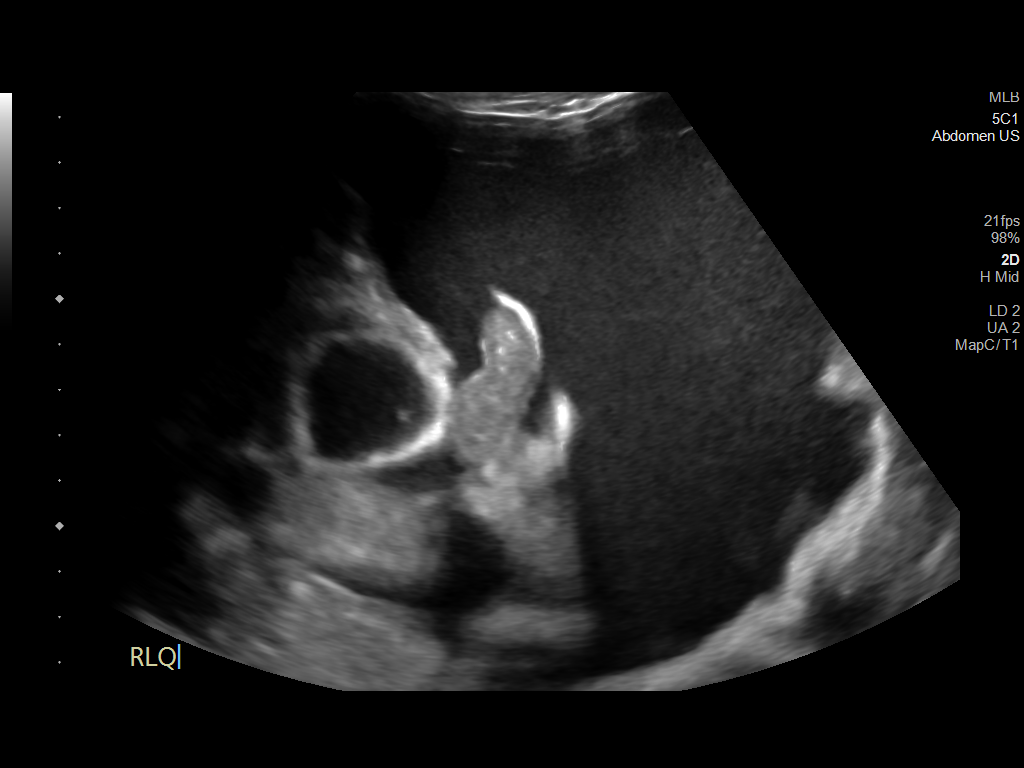
[im 2/2]
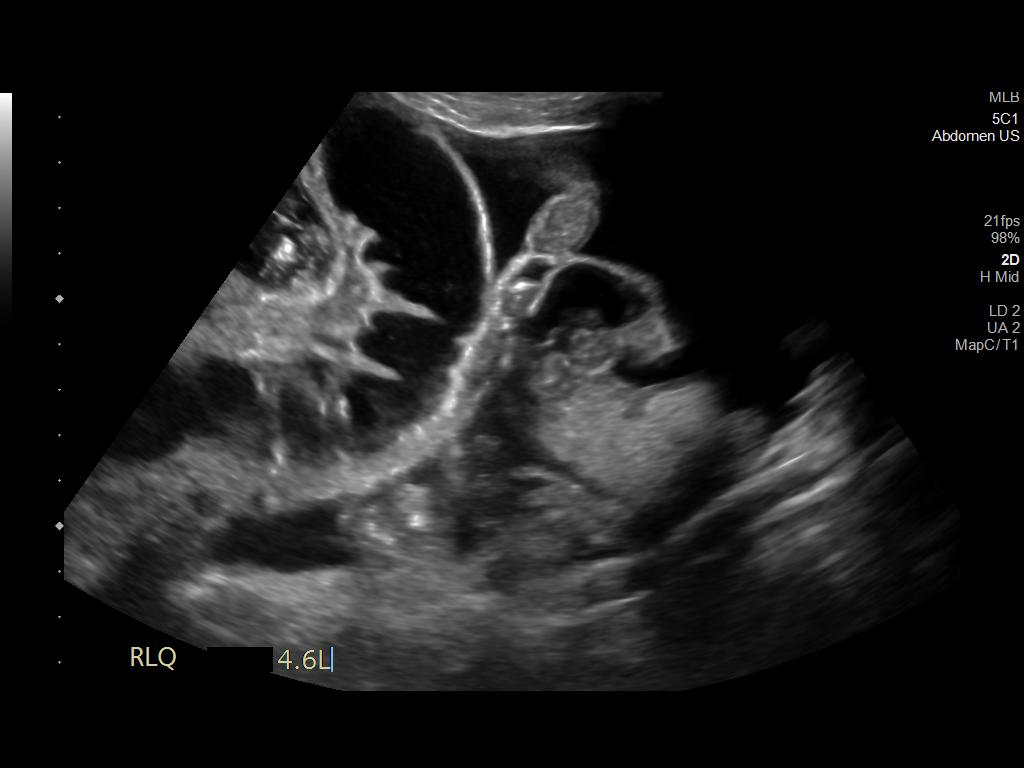

[2 of 2 positions shown; findings below may reference images not displayed]

EXAM:
ULTRASOUND GUIDED DIAGNOSTIC AND THERAPEUTIC PARACENTESIS

MEDICATIONS:
Lidocaine 1% 10 mL

COMPLICATIONS:
None immediate.

PROCEDURE:
Informed written consent was obtained from the patient after a
discussion of the risks, benefits and alternatives to treatment. A
timeout was performed prior to the initiation of the procedure.

Initial ultrasound scanning demonstrates a moderate amount of
ascites within the right lower abdominal quadrant. The right lower
abdomen was prepped and draped in the usual sterile fashion. 1%
lidocaine was used for local anesthesia.

Following this, a 19 gauge, 7-cm, Yueh catheter was introduced. An
ultrasound image was saved for documentation purposes. The
paracentesis was performed. The catheter was removed and a dressing
was applied. The patient tolerated the procedure well without
immediate post procedural complication.
FINDINGS: A total of approximately 4.6 L of serosanguineous fluid was removed.
Samples were sent to the laboratory as requested by the clinical
team.
IMPRESSION: Successful ultrasound-guided diagnostic and therapeutic paracentesis
yielding 4.6 liters of peritoneal fluid.

Read by: Gresa Ta, NP

## 2021-06-23 IMAGING — CT CT ANGIO CHEST
2 of 7 series · 18 of 46 positions shown · IV contrast (APPLIED)
Comparison: None.

CLINICAL DATA: Shortness of breath

EXAM:
CT ANGIOGRAPHY CHEST WITH CONTRAST
TECHNIQUE: Multidetector CT imaging of the chest was performed using the
standard protocol during bolus administration of intravenous
contrast. Multiplanar CT image reconstructions and MIPs were
obtained to evaluate the vascular anatomy.
CONTRAST:  60mL OMNIPAQUE IOHEXOL 350 MG/ML SOLN

[Series 7: thins · axial · 0.67mm/px · z∈[+1108,+1343]mm · 15 of 378 slices shown]
[im 21/378  lung]
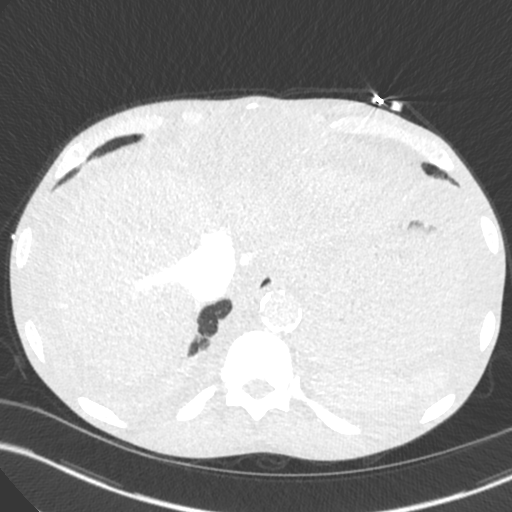
[im 42/378  soft-tissue]
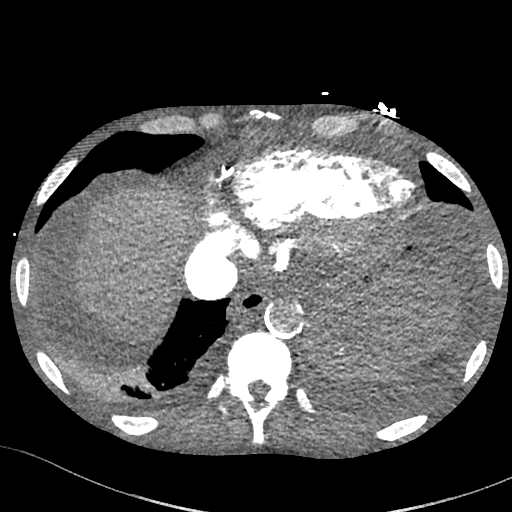
[im 63/378  lung]
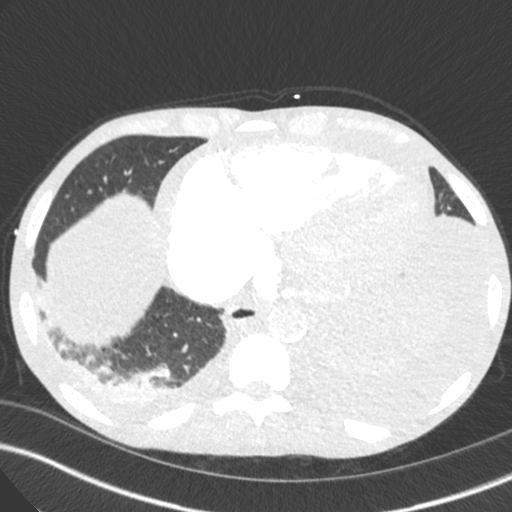
[im 84/378  soft-tissue]
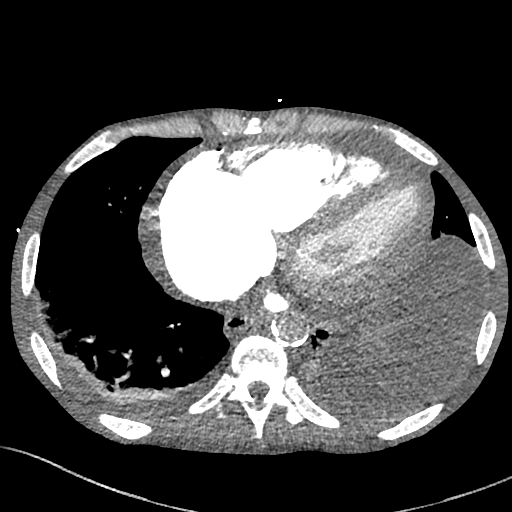
[im 126/378  lung]
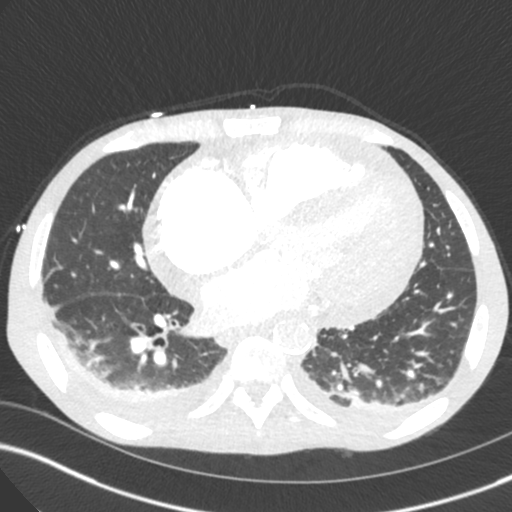
[im 147/378  soft-tissue]
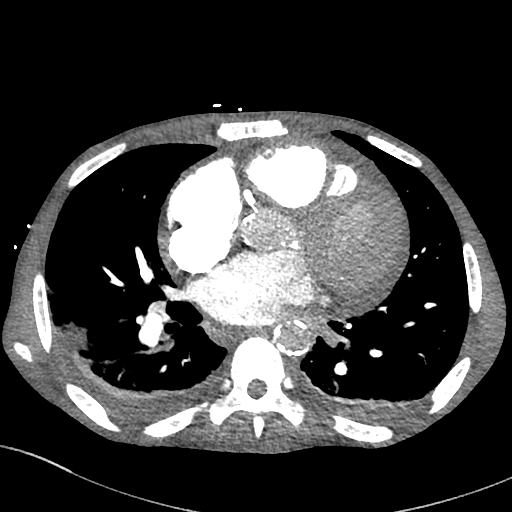
[im 168/378  lung]
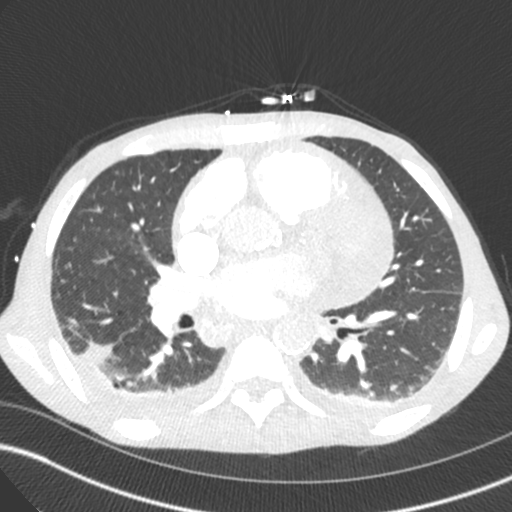
[im 189/378  soft-tissue]
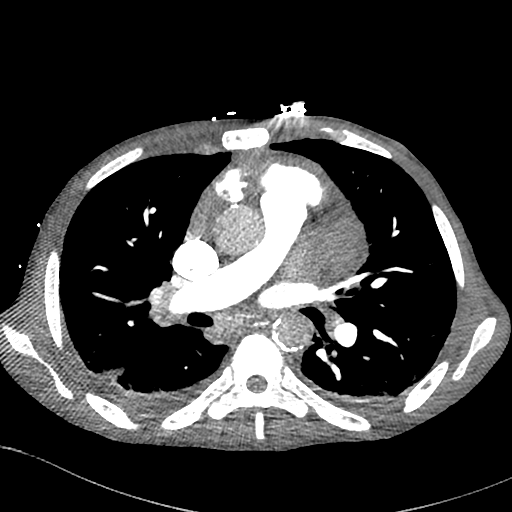
[im 210/378  lung]
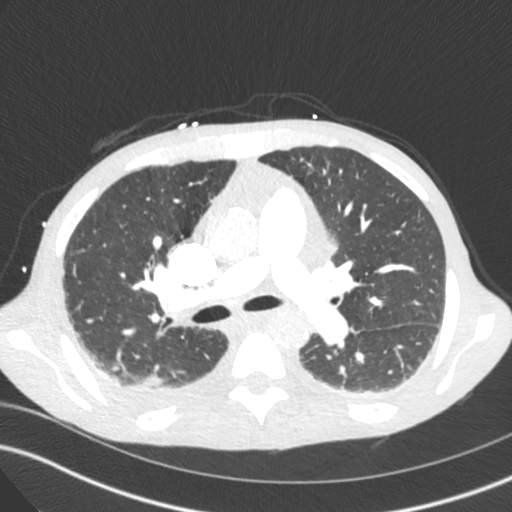
[im 231/378  soft-tissue]
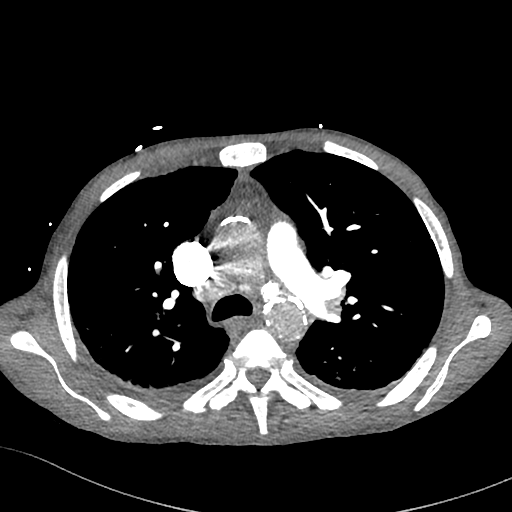
[im 252/378  lung]
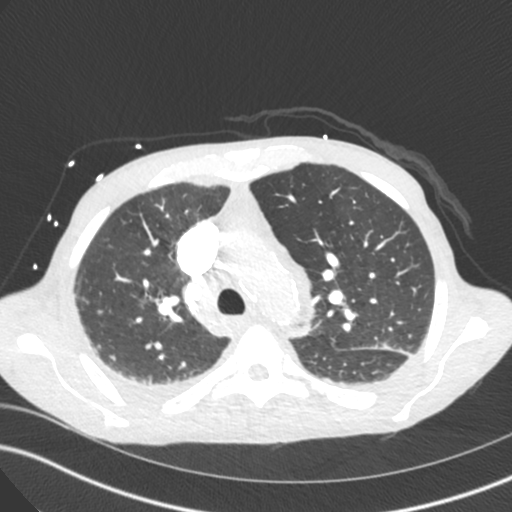
[im 294/378  soft-tissue]
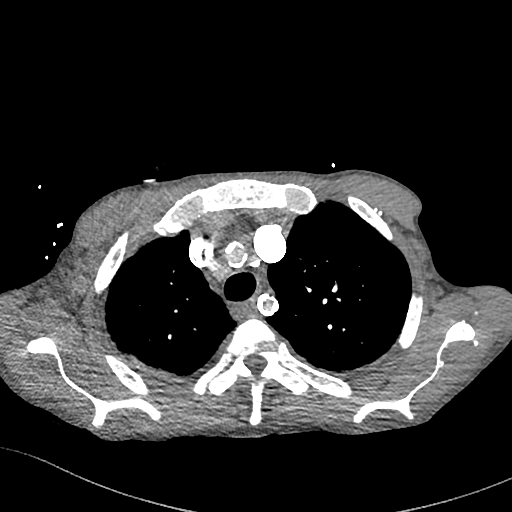
[im 315/378  lung]
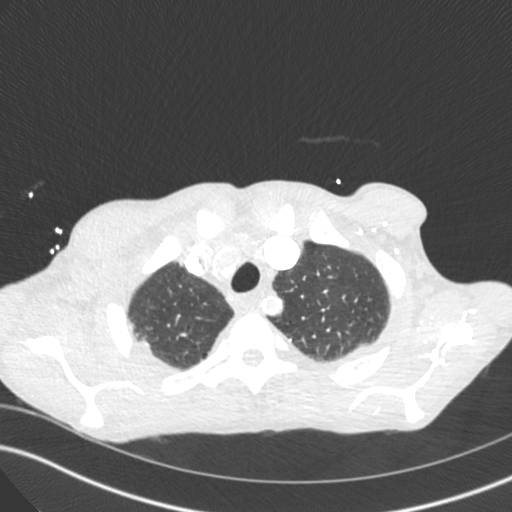
[im 336/378  soft-tissue]
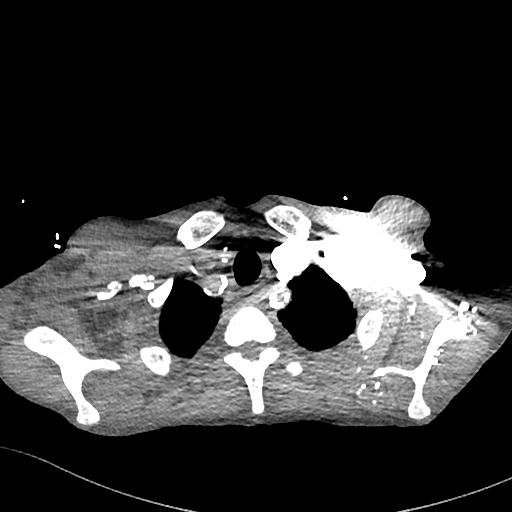
[im 357/378  lung]
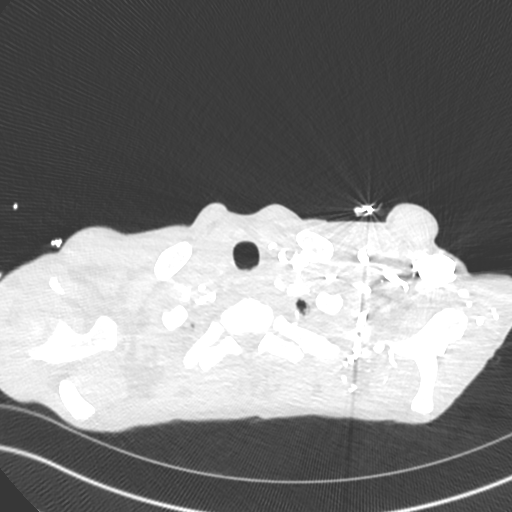

[Series 8: cor · coronal · 0.54mm/px · 3 of 125 slices shown]
[im 32/125  soft-tissue]
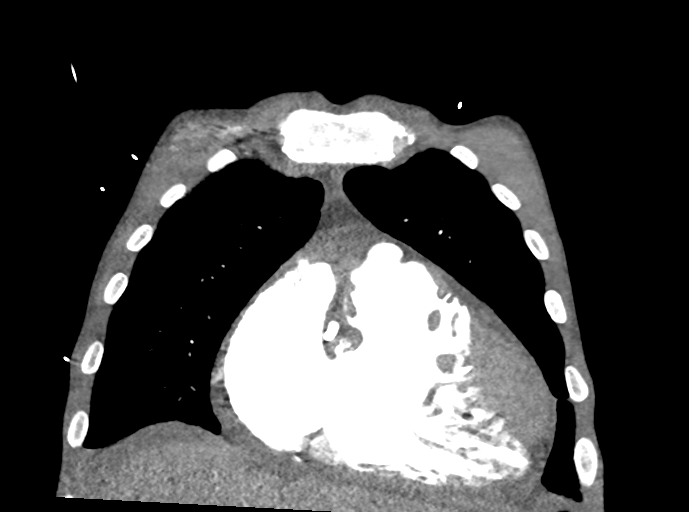
[im 63/125  soft-tissue]
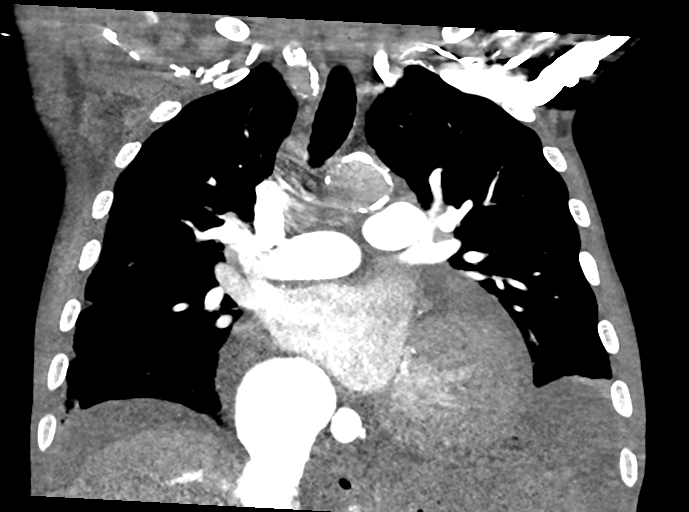
[im 94/125  soft-tissue]
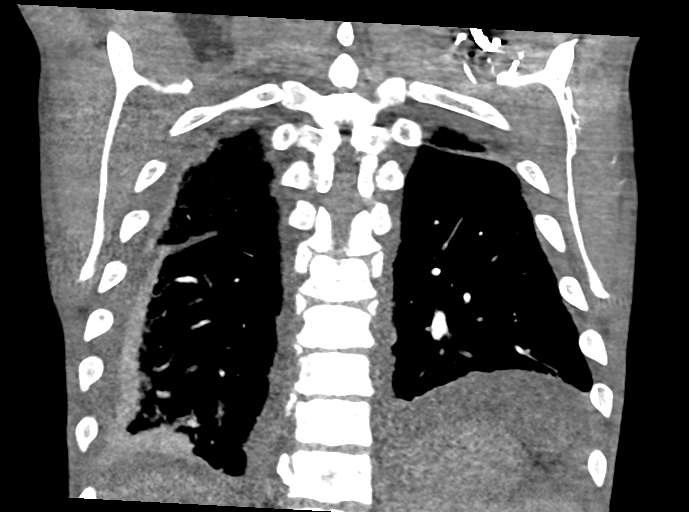

[18 of 46 positions shown; findings below may reference images not displayed]

FINDINGS: Cardiovascular: Satisfactory opacification of the pulmonary arteries
to the segmental level. No evidence of pulmonary embolism. Gross
cardiomegaly. Three-vessel coronary artery calcifications and/or
stents. Small pericardial effusion. Aortic atherosclerosis.

Mediastinum/Nodes: No enlarged mediastinal, hilar, or axillary lymph
nodes. Thyroid gland, trachea, and esophagus demonstrate no
significant findings.

Lungs/Pleura: Small bilateral pleural effusions and associated
atelectasis or consolidation.

Upper Abdomen: Ascites in the included upper abdomen.

Musculoskeletal: No chest wall abnormality. No acute or significant
osseous findings.

Review of the MIP images confirms the above findings.
IMPRESSION: 1. Negative examination for pulmonary embolism.
2. Small bilateral pleural effusions and associated atelectasis or
consolidation.
3. Gross cardiomegaly with small pericardial effusion.
4. Three-vessel coronary artery calcifications and/or stents.
5. Ascites in the included upper abdomen.
6. Aortic Atherosclerosis ([id number]).

## 2021-07-04 IMAGING — US IR PARACENTESIS
1 series · 3 of 3 positions shown · non-contrast
Comparison: none

INDICATION: Cirrhosis with recurrent ascites. Request for diagnostic and
therapeutic paracentesis.

[Series 1: ir (id) (id)/(id)/(id) ir · 3 of 3 slices shown]
[im 1/3]
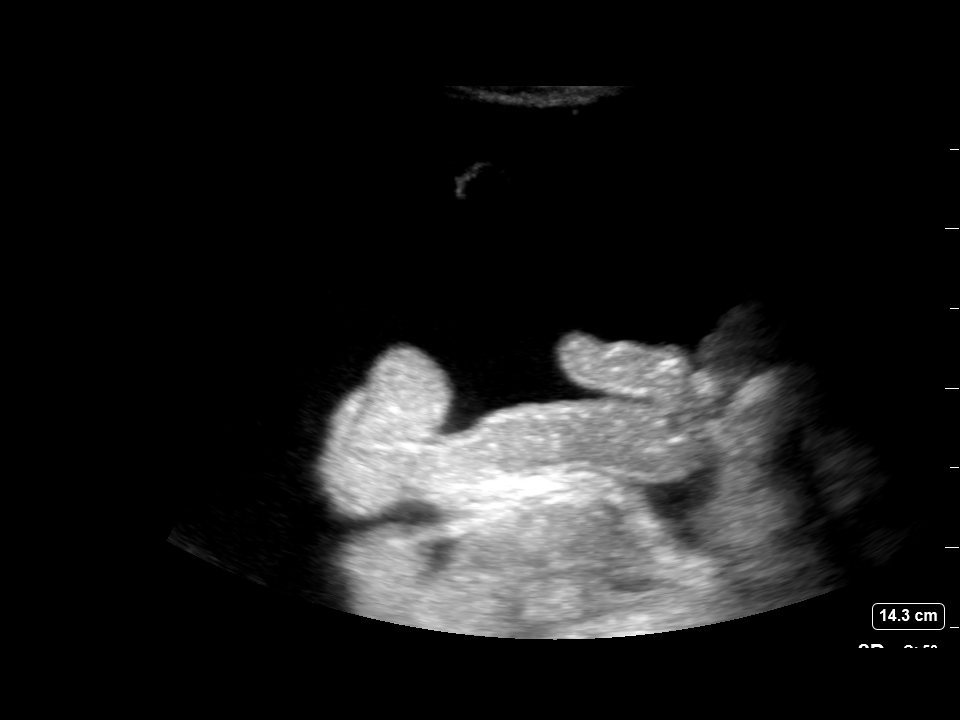
[im 2/3]
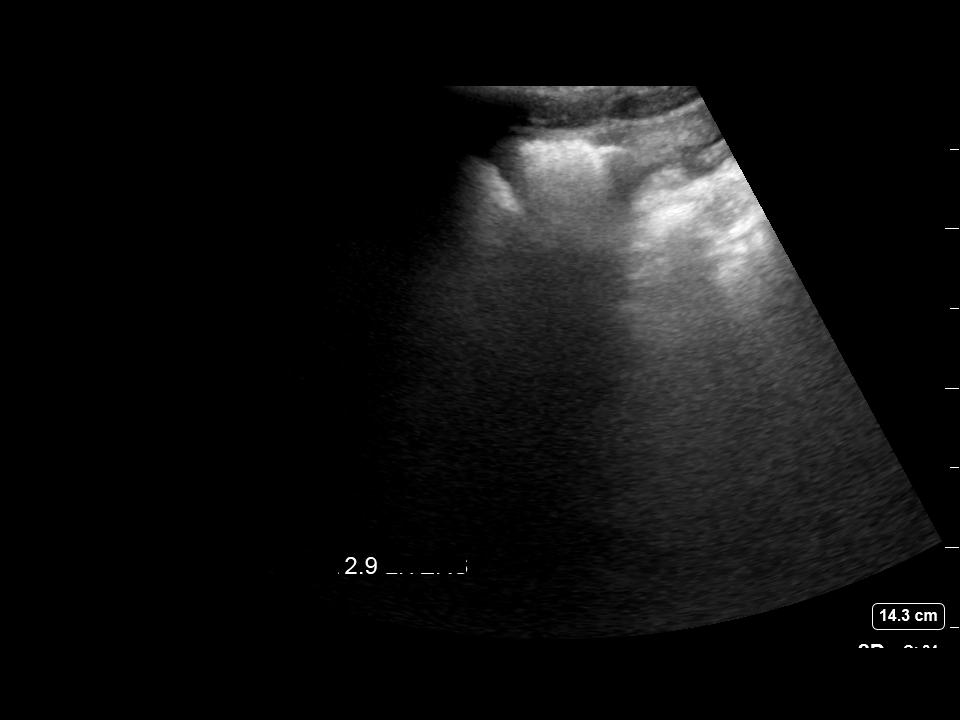
[im 3/3]
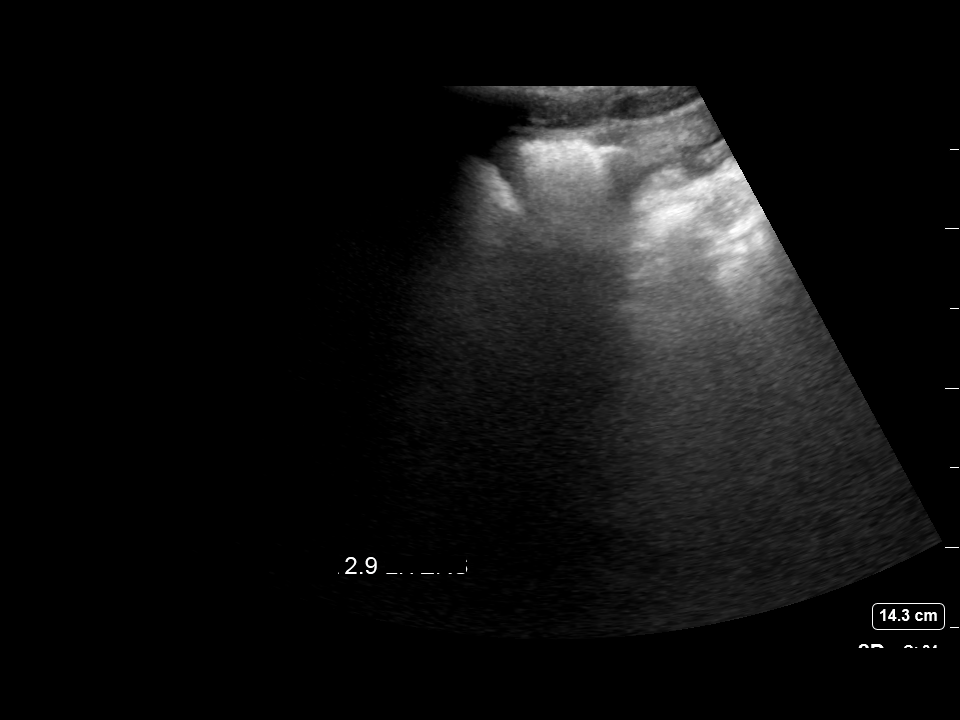

[3 of 3 positions shown; findings below may reference images not displayed]

EXAM:
ULTRASOUND GUIDED PARACENTESIS

MEDICATIONS:
1% lidocaine 15 mL

COMPLICATIONS:
None immediate.

PROCEDURE:
Informed written consent was obtained from the patient after a
discussion of the risks, benefits and alternatives to treatment. A
timeout was performed prior to the initiation of the procedure.

Initial ultrasound scanning demonstrates a moderate amount of
ascites within the left lower abdominal quadrant. The left lower
abdomen was prepped and draped in the usual sterile fashion. 1%
lidocaine was used for local anesthesia.

Following this, a 19 gauge, 7-cm, Yueh catheter was introduced. An
ultrasound image was saved for documentation purposes. The
paracentesis was performed. The catheter was removed and a dressing
was applied. The patient tolerated the procedure well without
immediate post procedural complication.
FINDINGS: A total of approximately 2.9 L of red fluid was removed. Samples
were sent to the laboratory as requested by the clinical team.
IMPRESSION: Successful ultrasound-guided paracentesis yielding 2.9 liters of
peritoneal fluid.

## 2021-07-11 IMAGING — US IR PARACENTESIS
1 series · 4 of 4 positions shown · non-contrast
Comparison: none

INDICATION: Patient with a history of cirrhosis and recurrent ascites.
Interventional radiology asked to perform a therapeutic
paracentesis.

[Series 1: ir (id) (id)/(id)/(id) ir · 4 of 4 slices shown]
[im 1/4]
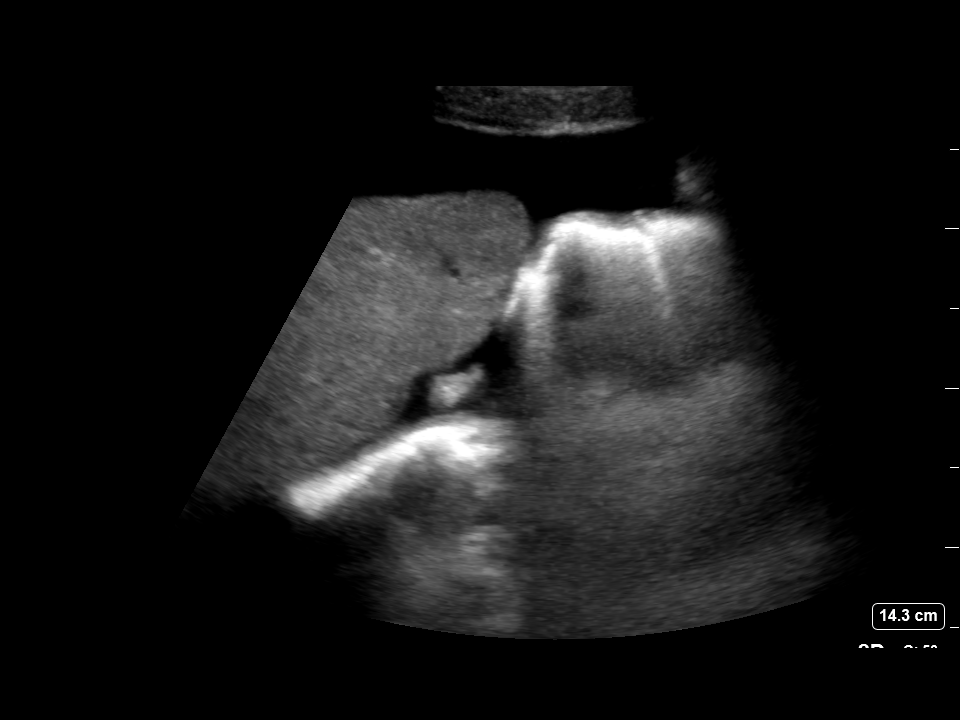
[im 2/4]
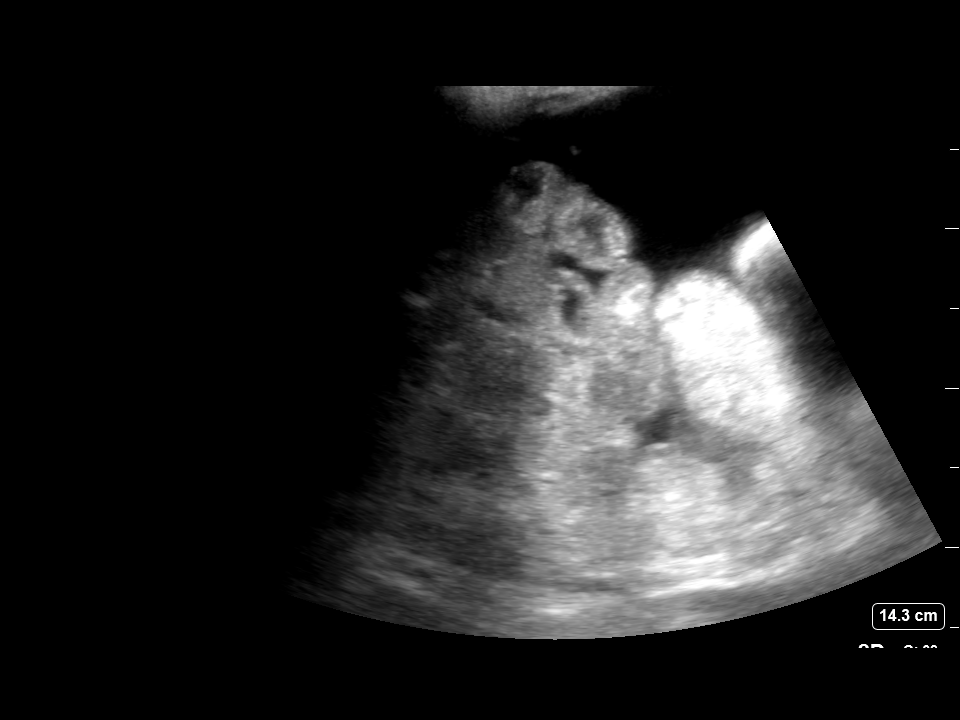
[im 3/4]
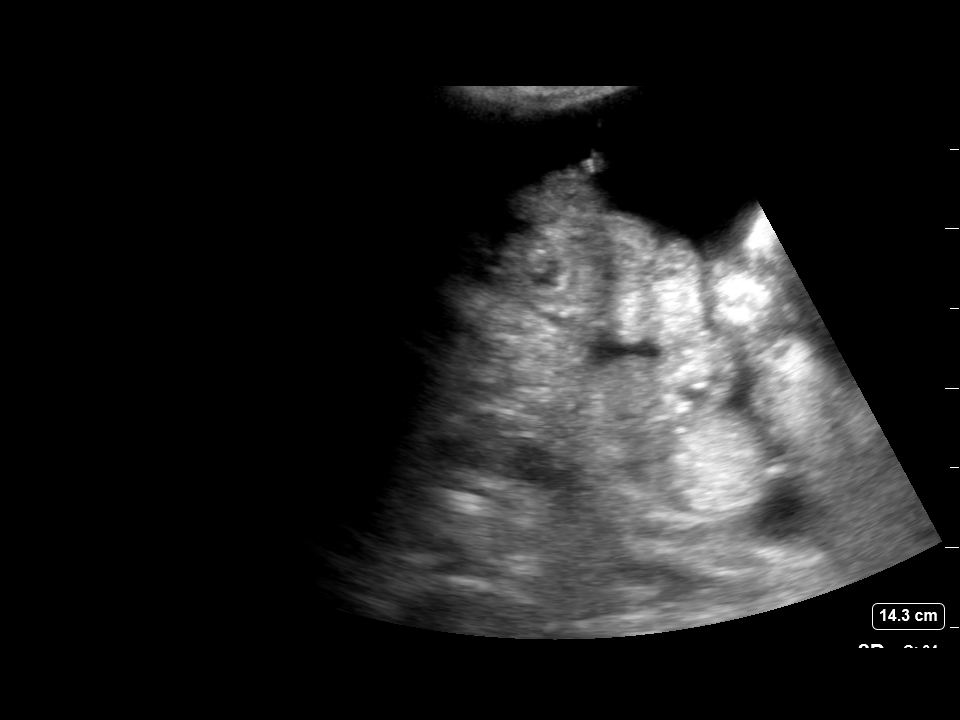
[im 4/4]
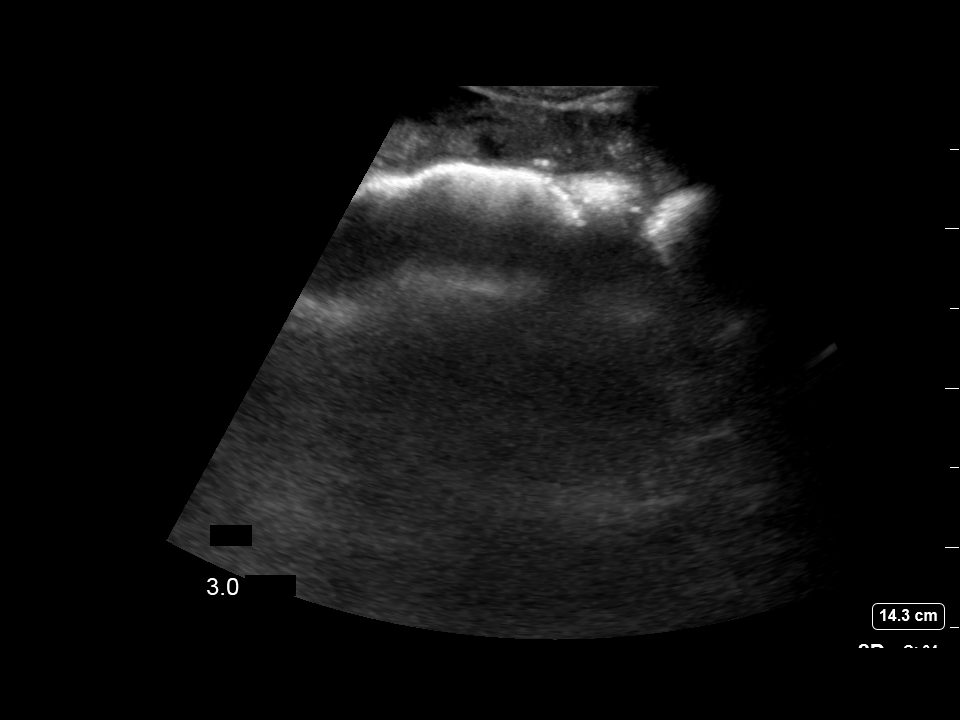

[4 of 4 positions shown; findings below may reference images not displayed]

EXAM:
ULTRASOUND GUIDED PARACENTESIS

MEDICATIONS:
1% lidocaine 20 mL

COMPLICATIONS:
None immediate.

PROCEDURE:
Informed written consent was obtained from the patient after a
discussion of the risks, benefits and alternatives to treatment. A
timeout was performed prior to the initiation of the procedure.

Initial ultrasound scanning demonstrates a large amount of ascites
within the right lower abdominal quadrant. The right lower abdomen
was prepped and draped in the usual sterile fashion. 1% lidocaine
was used for local anesthesia.

Following this, a 19 gauge, 7-cm, Yueh catheter was introduced. A
strong fluid return was obtained but when attached to suction the
flow stopped. Multiple attempts at manipulating the catheter were
made without success. Ultrasound imaging continued to demonstrate a
large amount of ascites in the right lower quadrant. The catheter
was removed and a second 19 gauge, 7-cm Levkovskij was inserted with the
same results. The catheter was removed and a dressing was applied.
Ultrasound scanning demonstrated a large amount of ascites within
the left lower abdominal quadrant. The left lower abdomen was
prepped and draped in the usual sterile fashion. 1% lidocaine was
used for local anesthesia. A 19 gauge, 7-cm Levkovskij was inserted with a
strong fluid return but when attached to suction the flow stopped.
Multiple attempts at manipulating the catheter were made without
success. The catheter was removed. A 6 French Safe-T-Centesis was
then inserted with expected results. An ultrasound image was saved
for documentation purposes. The paracentesis was performed. The
catheter was removed and a dressing was applied. The patient
tolerated the procedure well without immediate post procedural
complication.
FINDINGS: A total of approximately 3 L of light pink fluid was removed.
IMPRESSION: Successful ultrasound-guided paracentesis yielding 3 liters of
peritoneal fluid. Read by: Feremez Efem, NP

## 2021-07-14 IMAGING — DX DG CHEST 1V PORT
1 series · 1 of 1 positions shown · non-contrast
Comparison: Chest radiograph dated 12/21/2019 and CT dated
12/23/2019

CLINICAL DATA: 59-year-old male with shortness of breath.

EXAM:
PORTABLE CHEST 1 VIEW

[chest]
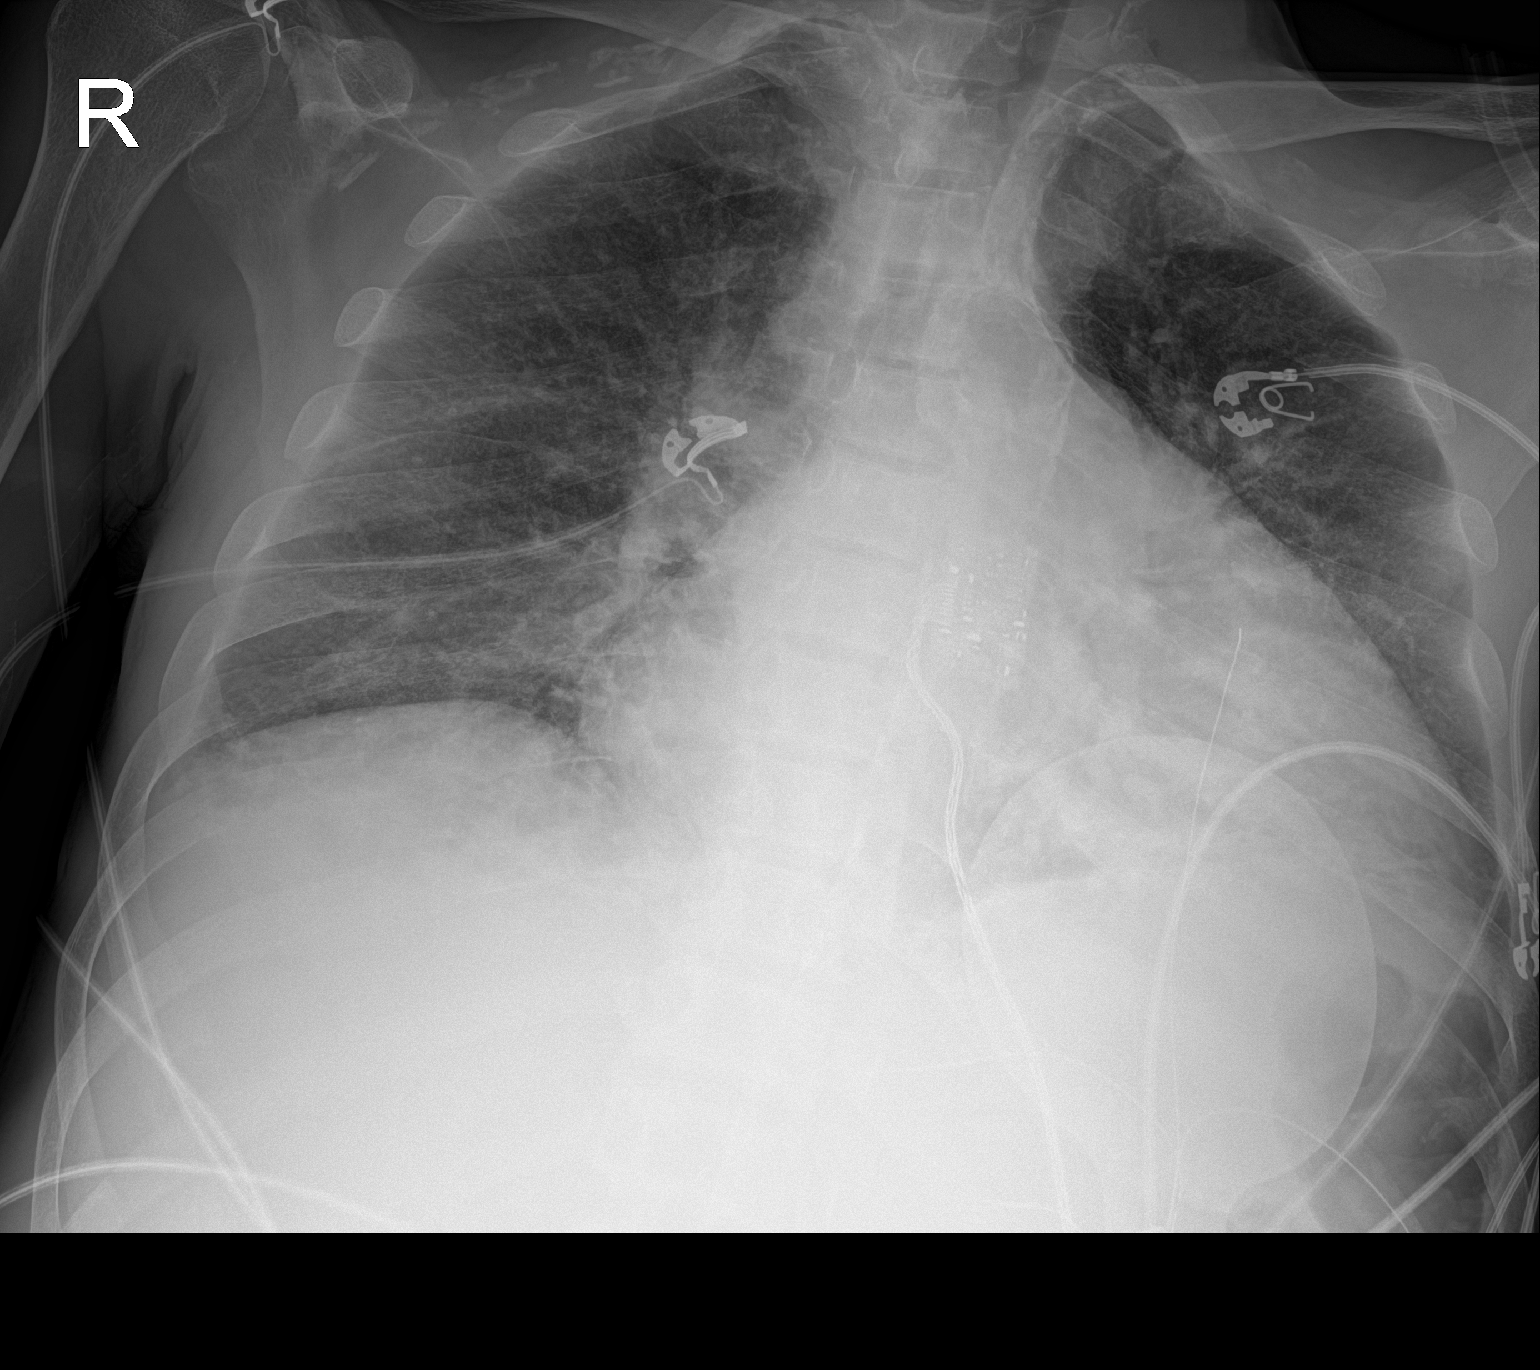

[1 of 1 positions shown; findings below may reference images not displayed]

FINDINGS: There is moderate enlargement of the cardiopericardial silhouette
similar to the radiograph of 12/21/2019. There is central vascular
prominence consistent with congestion. Trace bilateral pleural
effusions noted. There are bibasilar atelectatic changes. No lobar
consolidation or pneumothorax. Atherosclerotic calcification of the
aorta and branches of the aortic arch. No acute osseous pathology.
IMPRESSION: 1. Cardiomegaly with vascular congestion and possible mild edema.
Overall slight interval worsening of the congestion since the
radiograph of 12/21/2019.
2. Advanced atherosclerotic calcification of the aorta and branches
of the aortic arch.

## 2021-07-15 IMAGING — DX DG CHEST 1V PORT
1 series · 1 of 1 positions shown · non-contrast
Comparison: January 13, 2020

CLINICAL DATA: Shortness of breath.

EXAM:
PORTABLE CHEST 1 VIEW

[chest]
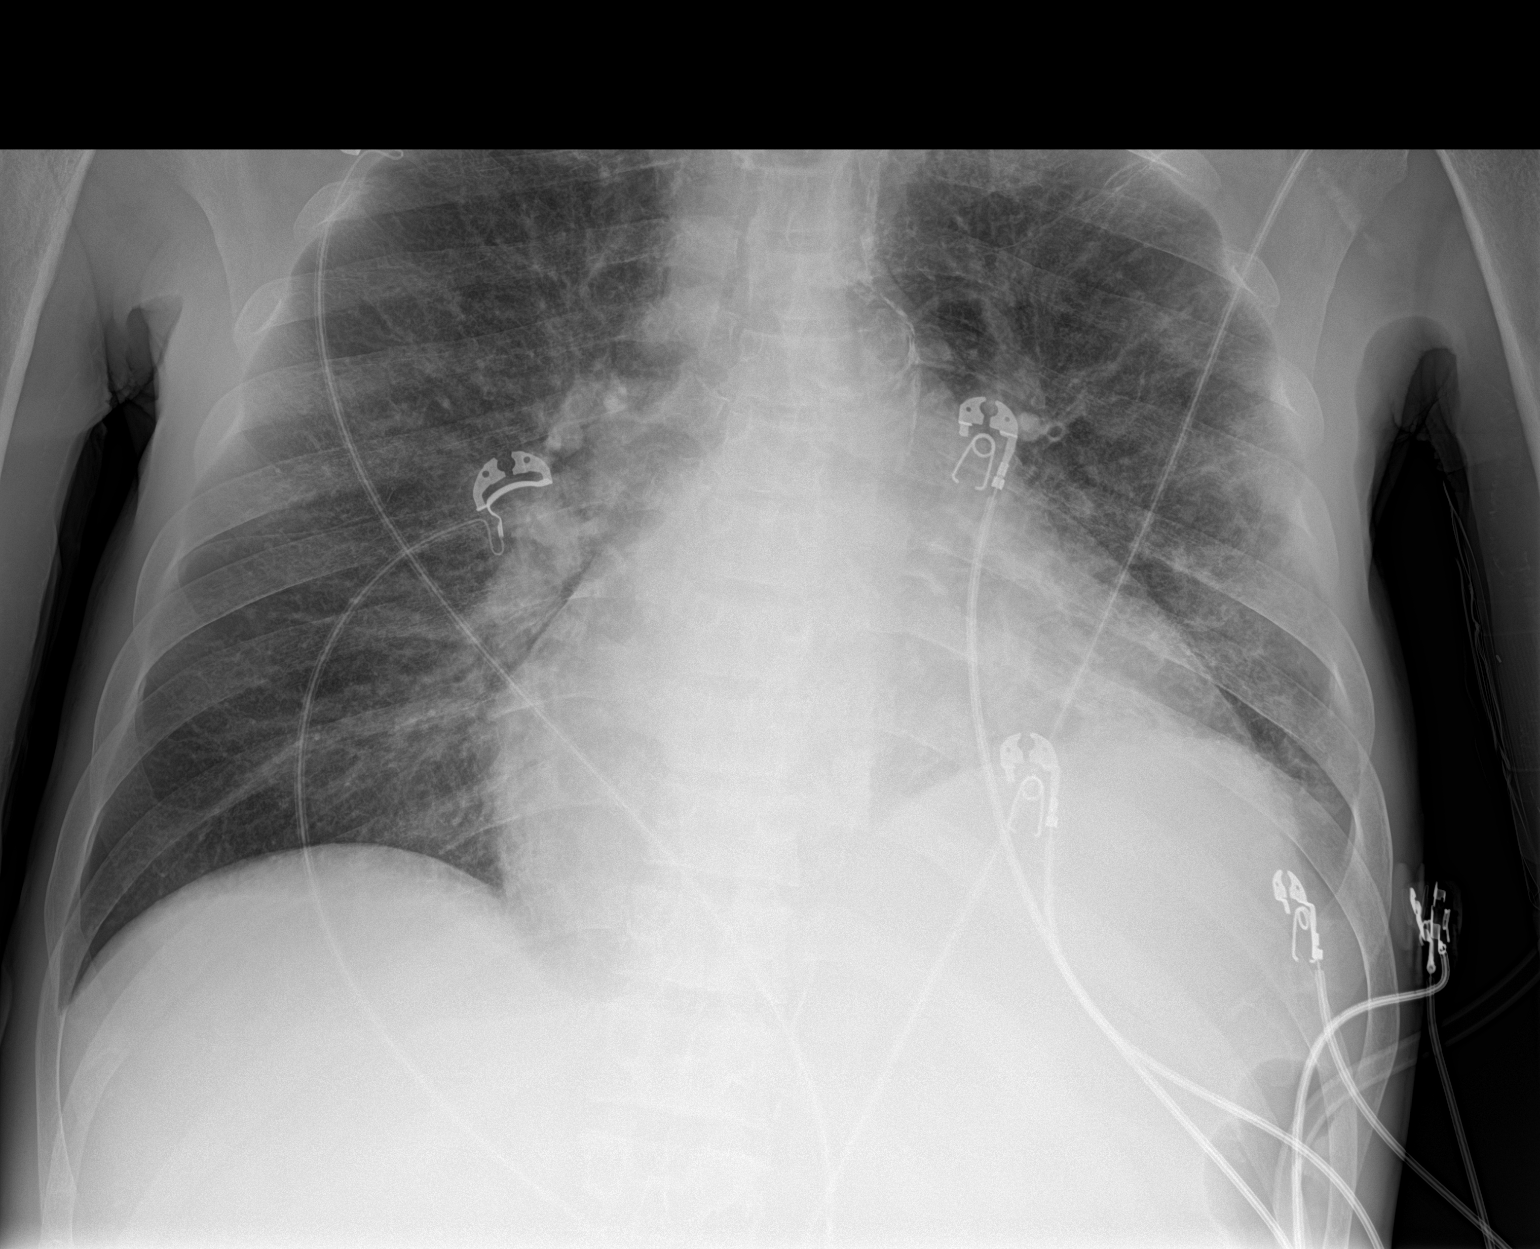

[1 of 1 positions shown; findings below may reference images not displayed]

FINDINGS: Mild, diffuse, chronic appearing increased lung markings are seen.
There is decreased vascular congestion since the prior study. Mild,
hazy areas of atelectasis and/or early infiltrate are seen along the
periphery of the mid left lung. There is no evidence of a pleural
effusion or pneumothorax. The cardiac silhouette is markedly
enlarged. There is mild calcification of the aortic arch. The
visualized skeletal structures are unremarkable.
IMPRESSION: 1. Mild, hazy areas of atelectasis and/or early infiltrate along the
periphery of the mid left lung.
2. Decreased pulmonary vascular congestion when compared to the
prior exam.

## 2021-07-15 IMAGING — US US ABDOMEN LIMITED
1 series · 4 of 4 positions shown · non-contrast
Comparison: 12/22/2019

CLINICAL DATA: Assess ascites. Multiple previous paracenteses due
to cirrhosis.

EXAM:
LIMITED ABDOMEN ULTRASOUND FOR ASCITES
TECHNIQUE: Limited ultrasound survey for ascites was performed in all four
abdominal quadrants.

[Series 1: us ascites (abdomen limited) · 4 of 4 slices shown]
[im 1/4]
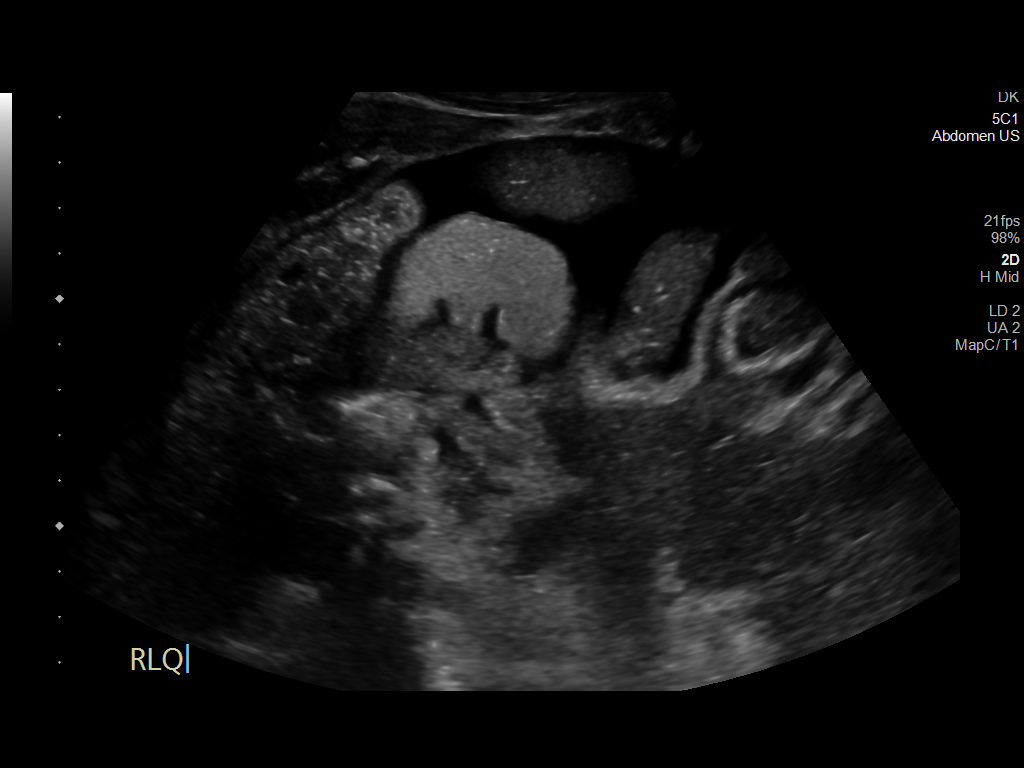
[im 2/4]
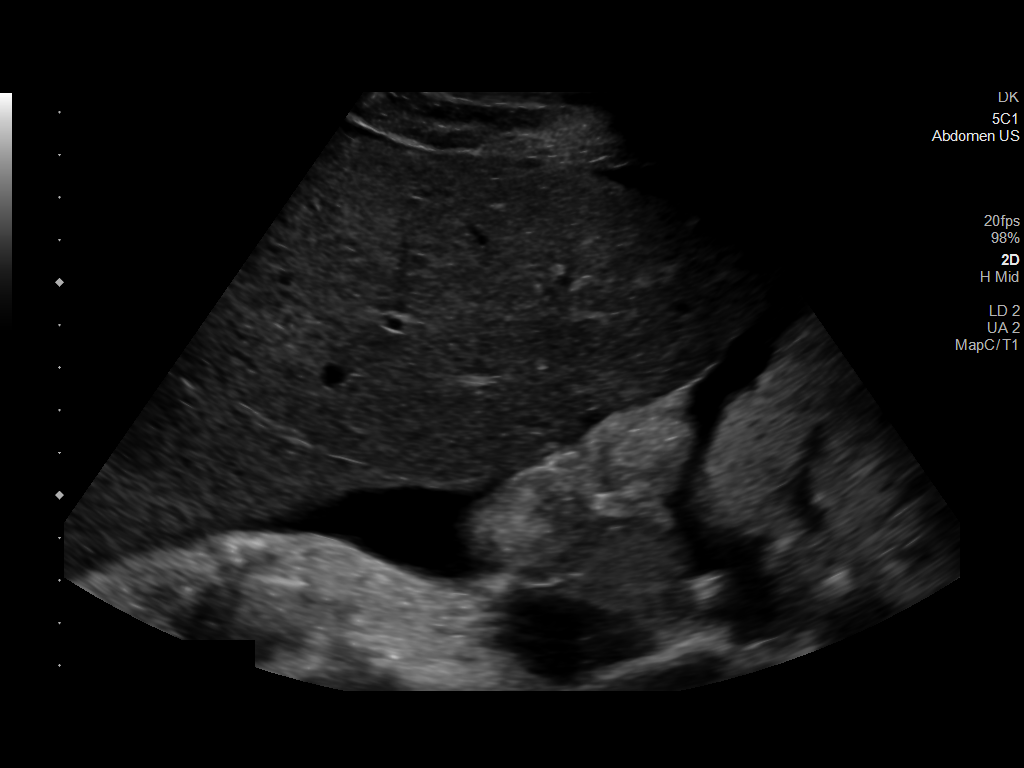
[im 3/4]
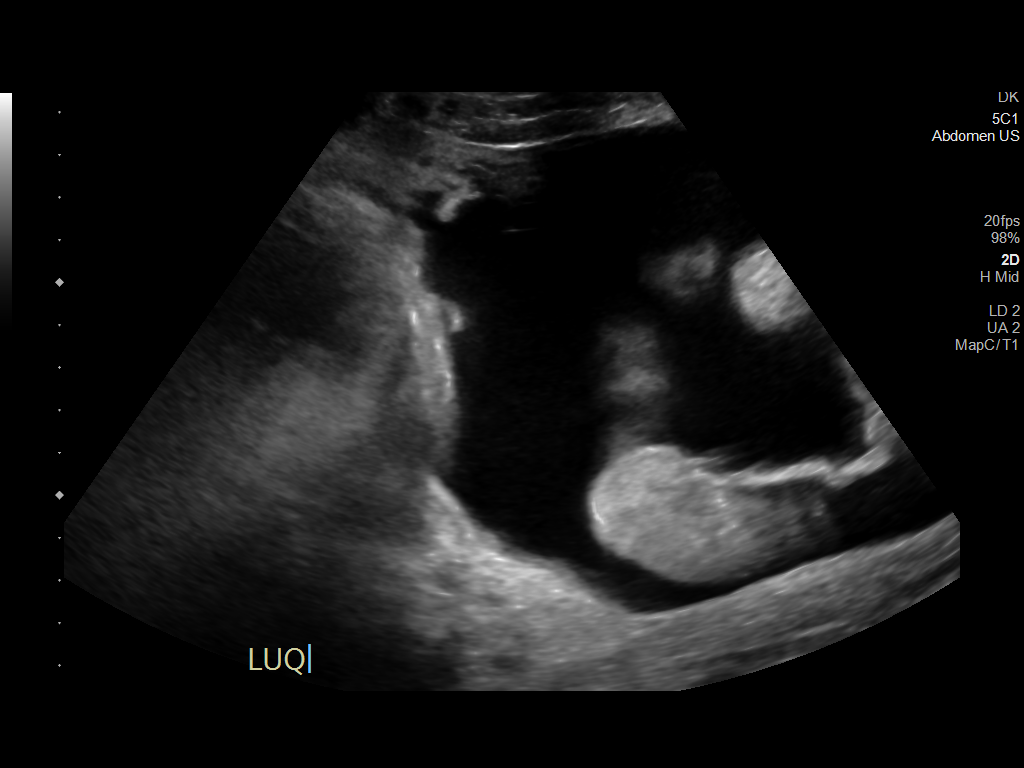
[im 4/4]
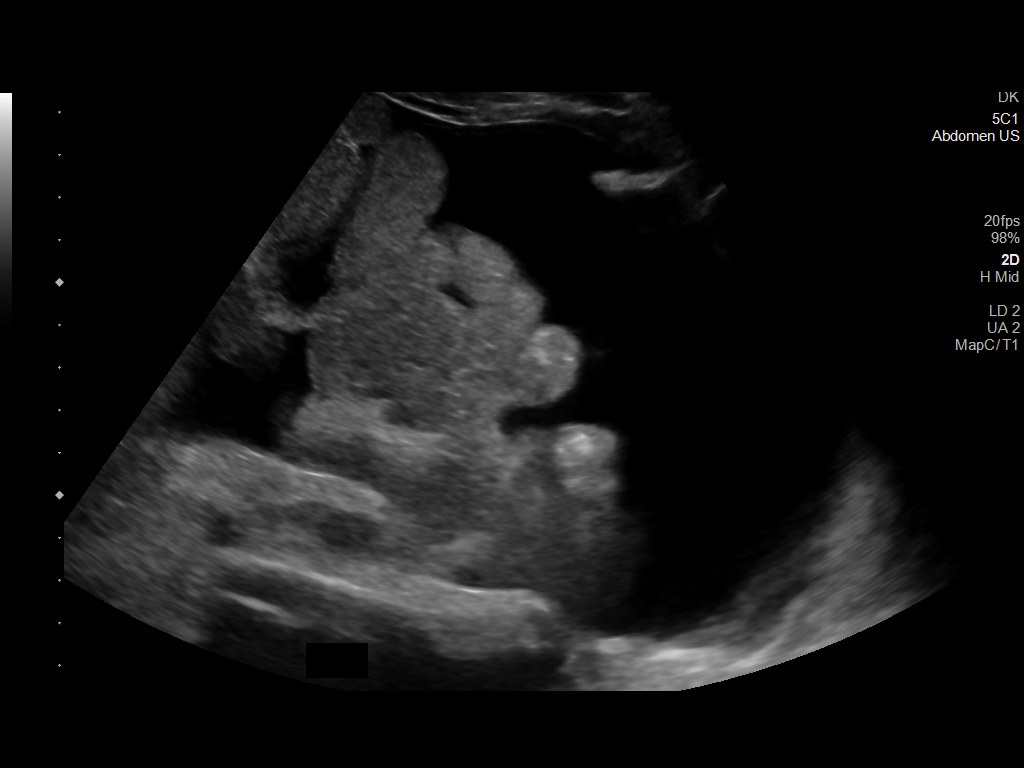

[4 of 4 positions shown; findings below may reference images not displayed]

FINDINGS: Examination demonstrates mild-to-moderate amount of ascites left
greater than right. Ascites over the right abdomen is improved
compared to the images from the previous exam.
IMPRESSION: Mild-to-moderate ascites left greater than right.

## 2021-07-15 IMAGING — CT CT ANGIO CHEST
2 of 5 series · 18 of 36 positions shown · IV contrast (omnipaque)
Comparison: CT chest 12/23/2019

CLINICAL DATA: Shortness of breath. Soft dialysis prematurely
secondary to itching. Recent hospital discharge.

EXAM:
CT ANGIOGRAPHY CHEST WITH CONTRAST
TECHNIQUE: Multidetector CT imaging of the chest was performed using the
standard protocol during bolus administration of intravenous
contrast. Multiplanar CT image reconstructions and MIPs were
obtained to evaluate the vascular anatomy.
CONTRAST:  60mL OMNIPAQUE IOHEXOL 350 MG/ML SOLN

[Series 6: pe lung · axial · 0.82mm/px · z∈[+1086,+1350]mm · 17 of 144 slices shown]
[im 6/144  lung]
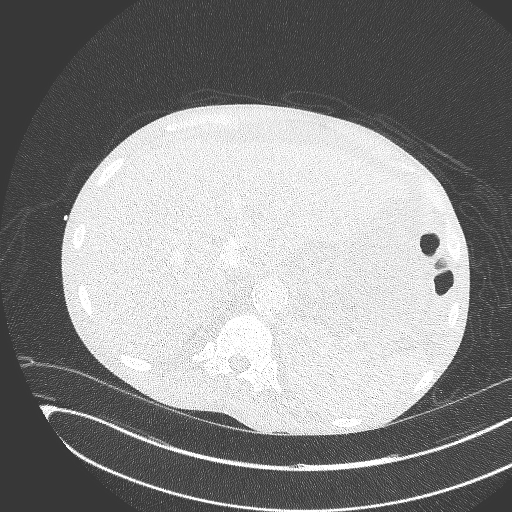
[im 16/144  mediastinal]
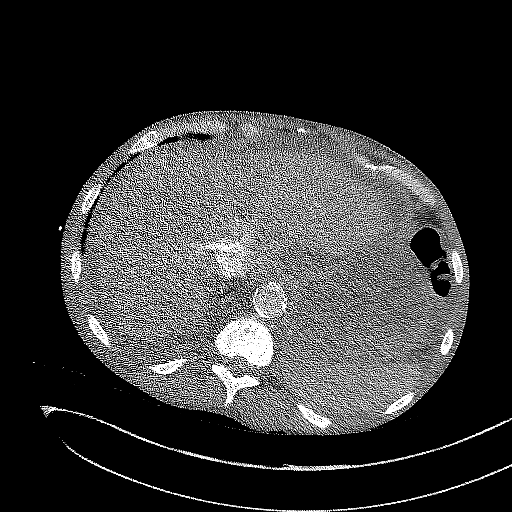
[im 22/144  lung]
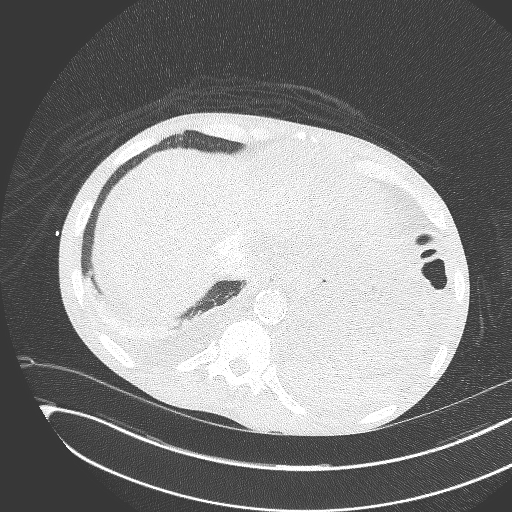
[im 32/144  mediastinal]
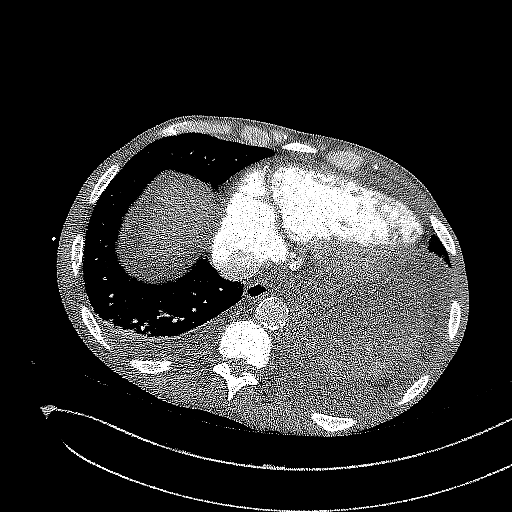
[im 38/144  lung]
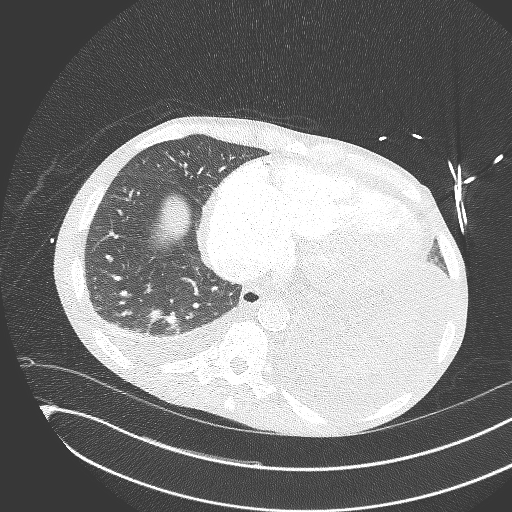
[im 48/144  mediastinal]
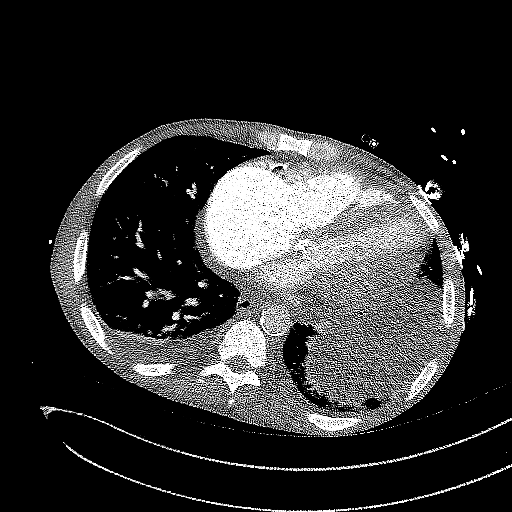
[im 53/144  lung]
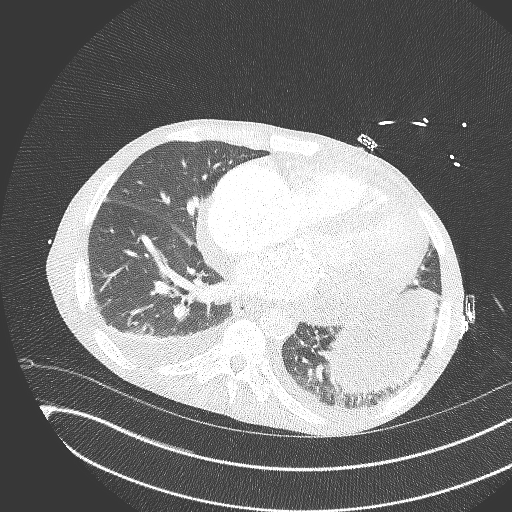
[im 64/144  mediastinal]
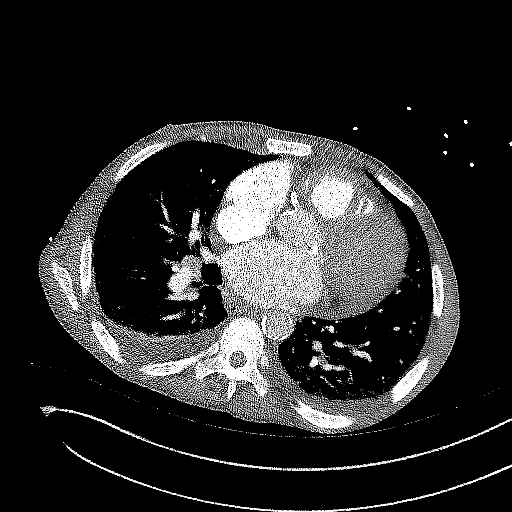
[im 75/144  lung]
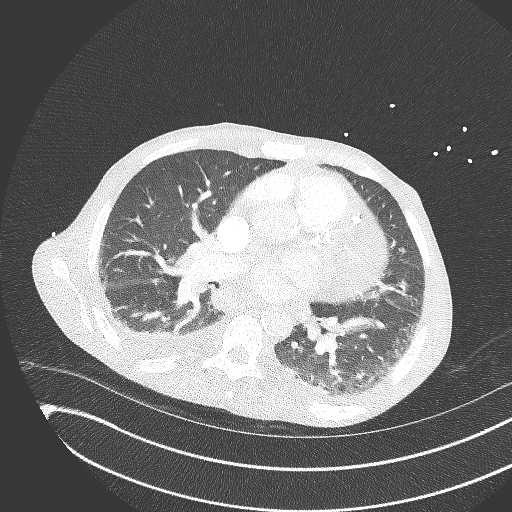
[im 80/144  mediastinal]
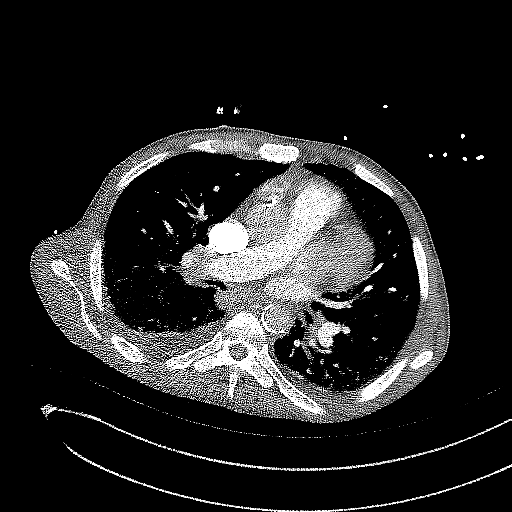
[im 91/144  lung]
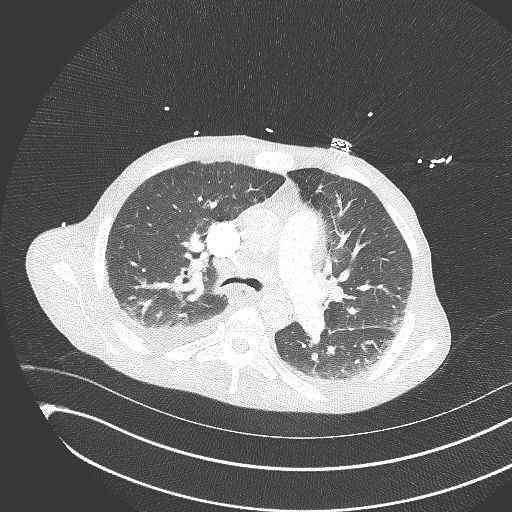
[im 96/144  mediastinal]
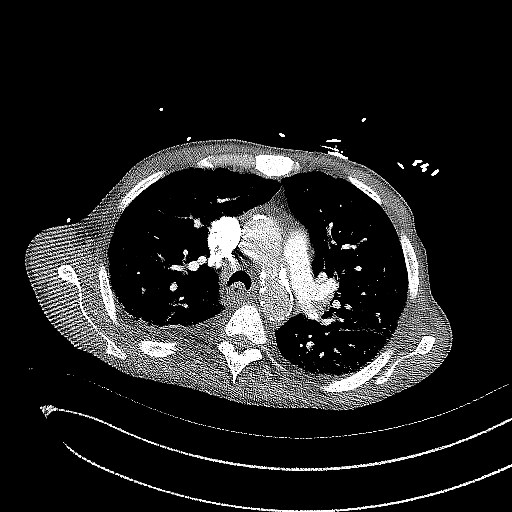
[im 106/144  lung]
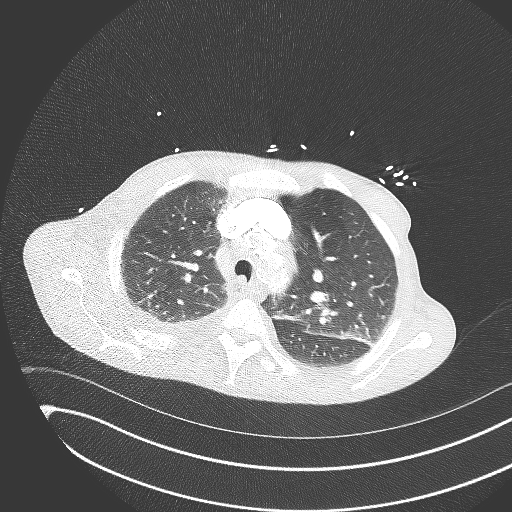
[im 112/144  mediastinal]
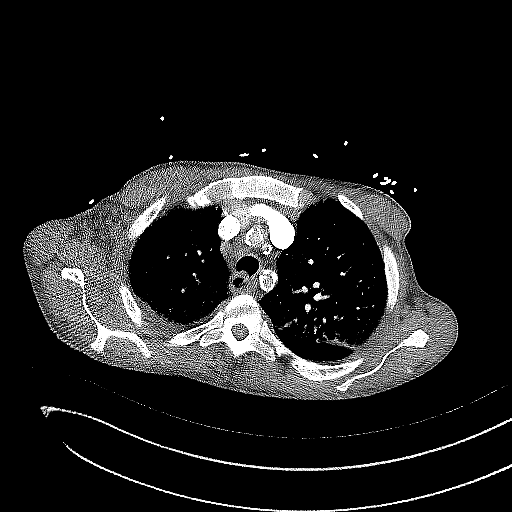
[im 122/144  lung]
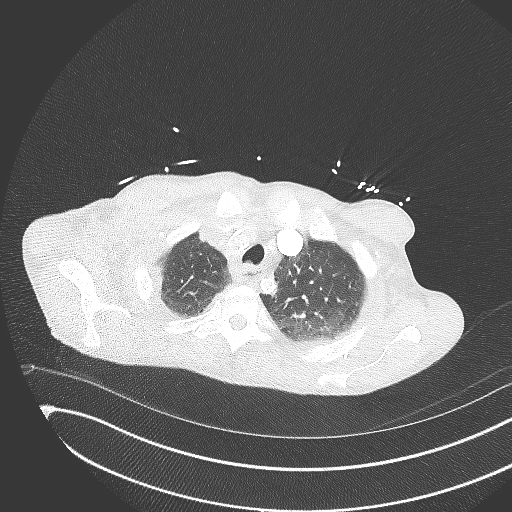
[im 128/144  mediastinal]
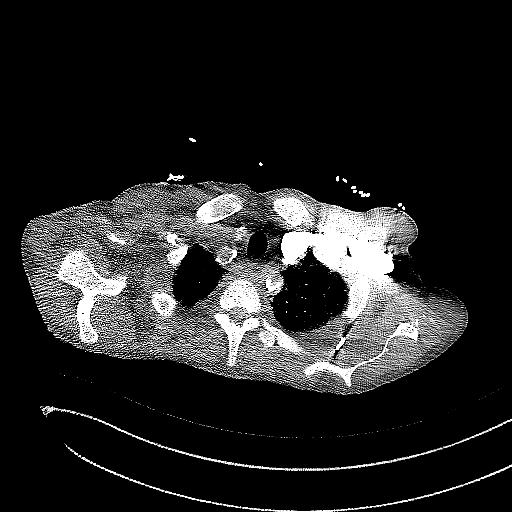
[im 138/144  lung]
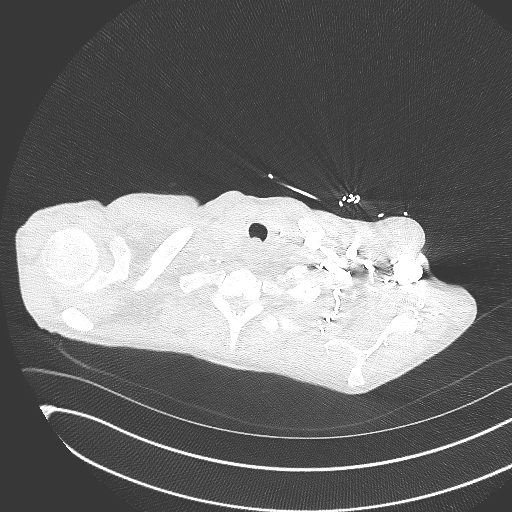

[Series 8: pe 2mm cor · coronal · 0.59mm/px · 1 of 151 slices shown]
[im 76/151  mediastinal]
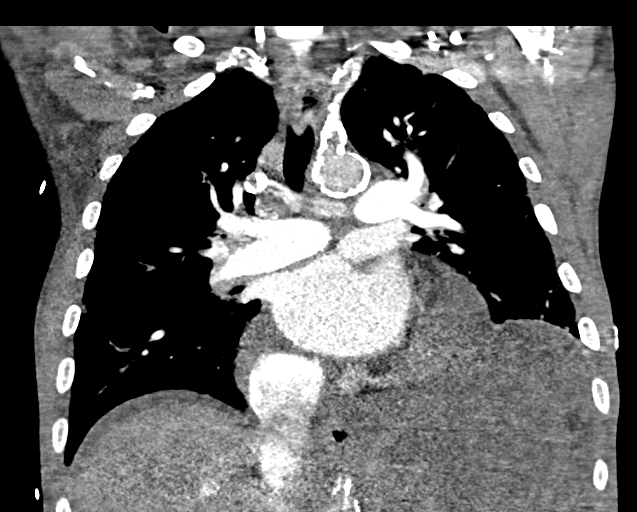

[18 of 36 positions shown; findings below may reference images not displayed]

FINDINGS: Cardiovascular: Satisfactory opacification the pulmonary arteries to
the segmental level. No pulmonary artery filling defects are
identified. Central pulmonary arteries are normal caliber.
Suboptimal opacification of the aorta for luminal evaluation.
Atherosclerotic plaque within the normal caliber aorta. Normal 3
vessel branching of the aortic arch. Extensive atheromatous plaque
throughout the proximal great vessels as included. Mild cardiomegaly
with predominantly biatrial enlargement. Dense calcifications noted
upon the coronary arteries as well as upon the mitral annulus and
aortic leaflets. Small pericardial effusion. Distension of the
azygos. Reflux of contrast into the IVC and hepatic veins. No other
major venous abnormalities on this arterial phase imaging.

Mediastinum/Nodes: Edematous changes the mediastinum. Normal thyroid
gland and thoracic inlet. No acute abnormality of the trachea or
esophagus. No worrisome mediastinal, hilar or axillary adenopathy.

Lungs/Pleura: Small bilateral pleural effusions with adjacent areas
of passive atelectasis. Septal and fissural thickening is present
with vascular redistribution. Some dependent areas of ground-glass
opacity favoring a combination of passive and dependent atelectasis.
No worrisome consolidative opacity. No pneumothorax. Stable biapical
pleuroparenchymal scarring. No worrisome nodules or masses.

Upper Abdomen: Upper abdominal ascites. No other acute upper
abdominal abnormality is visible.

Musculoskeletal: Diffuse body wall edema. Osseous manifestations of
renal osteodystrophy. No worrisome bone or chest wall lesions.

Review of the MIP images confirms the above findings.
IMPRESSION: 1. No evidence of acute pulmonary artery filling defects to suggest
pulmonary embolism.
2. Cardiomegaly with predominantly biatrial enlargement. Small
pericardial effusion.
3. Reflux of contrast into the IVC and hepatic veins, can be seen
with right heart failure/fluid overload.
4. Small bilateral pleural effusions with adjacent areas of passive
atelectasis.
5. Fissural and septal thickening with vascular redistribution
likely to reflect some interstitial edema.
6. Diffuse body wall edema, upper abdominal ascites, and body wall
edema, consistent with anasarca.
7. Osseous manifestations of renal osteodystrophy.
8. Aortic Atherosclerosis ([id number].C).

## 2021-07-18 IMAGING — US IR PARACENTESIS
1 series · 3 of 3 positions shown · non-contrast
Comparison: none

INDICATION: B/C, recurrent ascites ;request made for therapeutic paracentesis

[Series 1: ir (id) (id)/(id)/(id) ir · 3 of 3 slices shown]
[im 1/3]
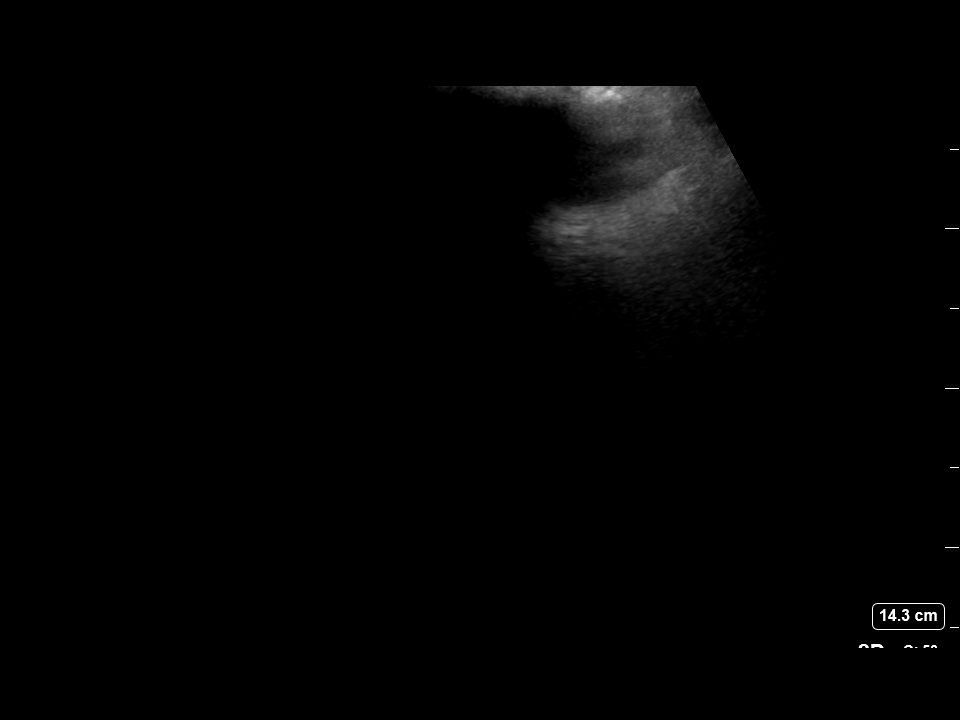
[im 2/3]
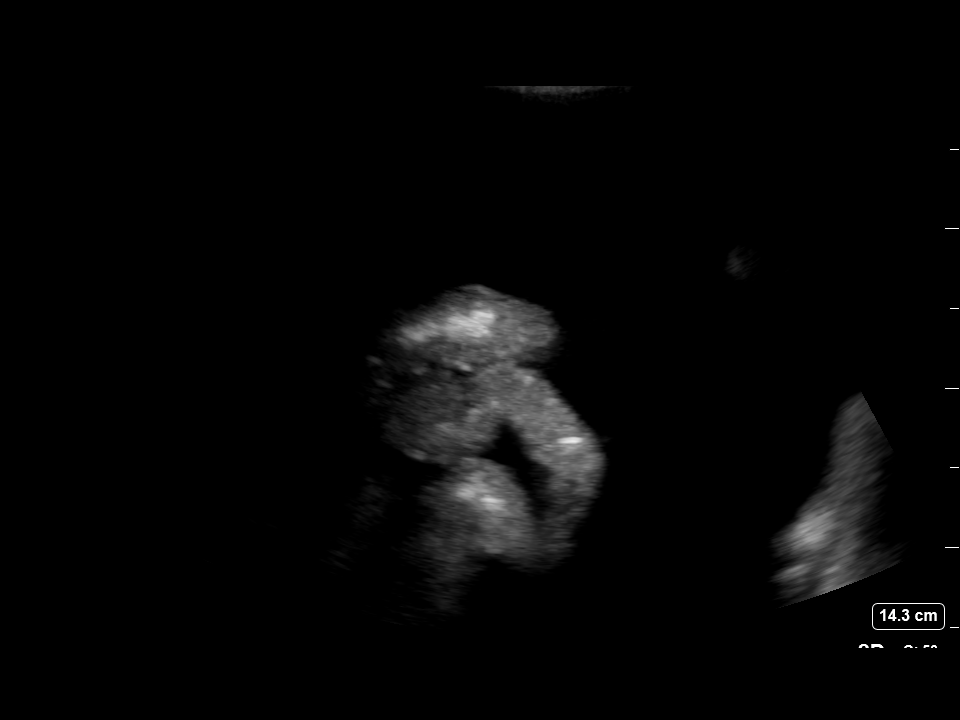
[im 3/3]
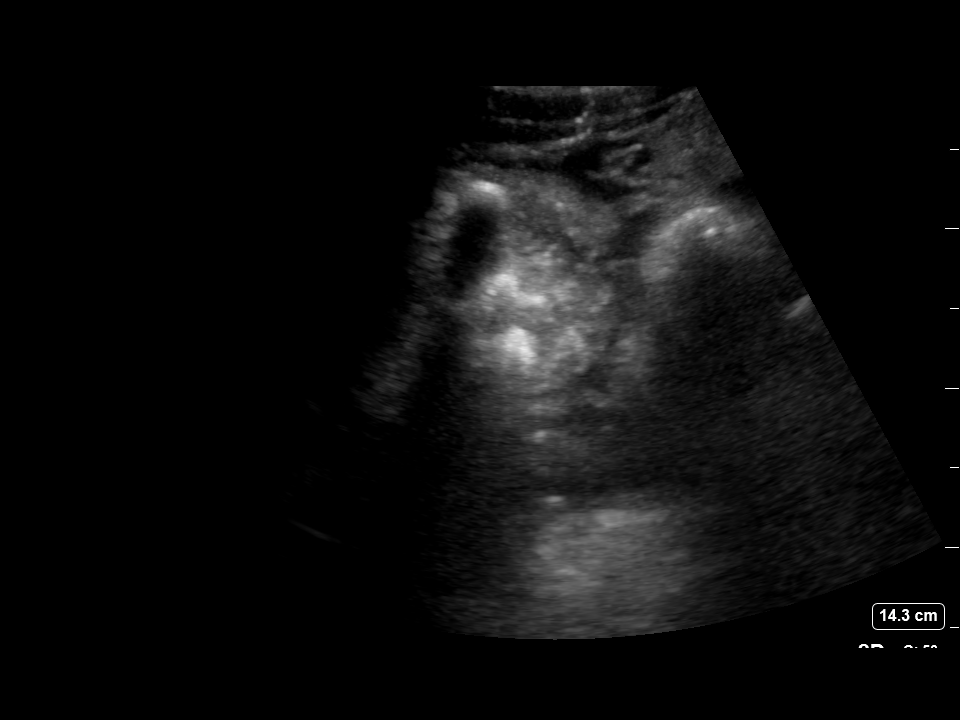

[3 of 3 positions shown; findings below may reference images not displayed]

EXAM:
ULTRASOUND GUIDED THERAPEUTIC  PARACENTESIS

MEDICATIONS:
None

COMPLICATIONS:
None immediate.

PROCEDURE:
Informed written consent was obtained from the patient after a
discussion of the risks, benefits and alternatives to treatment. A
timeout was performed prior to the initiation of the procedure.

Initial ultrasound scanning demonstrates a moderate amount of
ascites within the left lower abdominal quadrant. The left lower
abdomen was prepped and draped in the usual sterile fashion. 1%
lidocaine was used for local anesthesia.

Following this, a 6 Fr Safe-T-Centesis catheter was introduced. An
ultrasound image was saved for documentation purposes. The
paracentesis was performed. The catheter was removed and a dressing
was applied. The patient tolerated the procedure well without
immediate post procedural complication.
FINDINGS: A total of approximately 3.2 liters of hazy,amber fluid was removed.
IMPRESSION: Successful ultrasound-guided therapeutic paracentesis yielding
liters of peritoneal fluid.

## 2021-07-20 IMAGING — DX DG CHEST 2V
2 series · 3 of 3 positions shown · non-contrast
Comparison: Chest x-rays dated 01/14/2020 and 01/13/2020. Chest CT
angiogram dated 01/14/2020.

CLINICAL DATA: Shortness of breath, productive cough. Dialysis
yesterday.

EXAM:
CHEST - 2 VIEW

[chest lat]
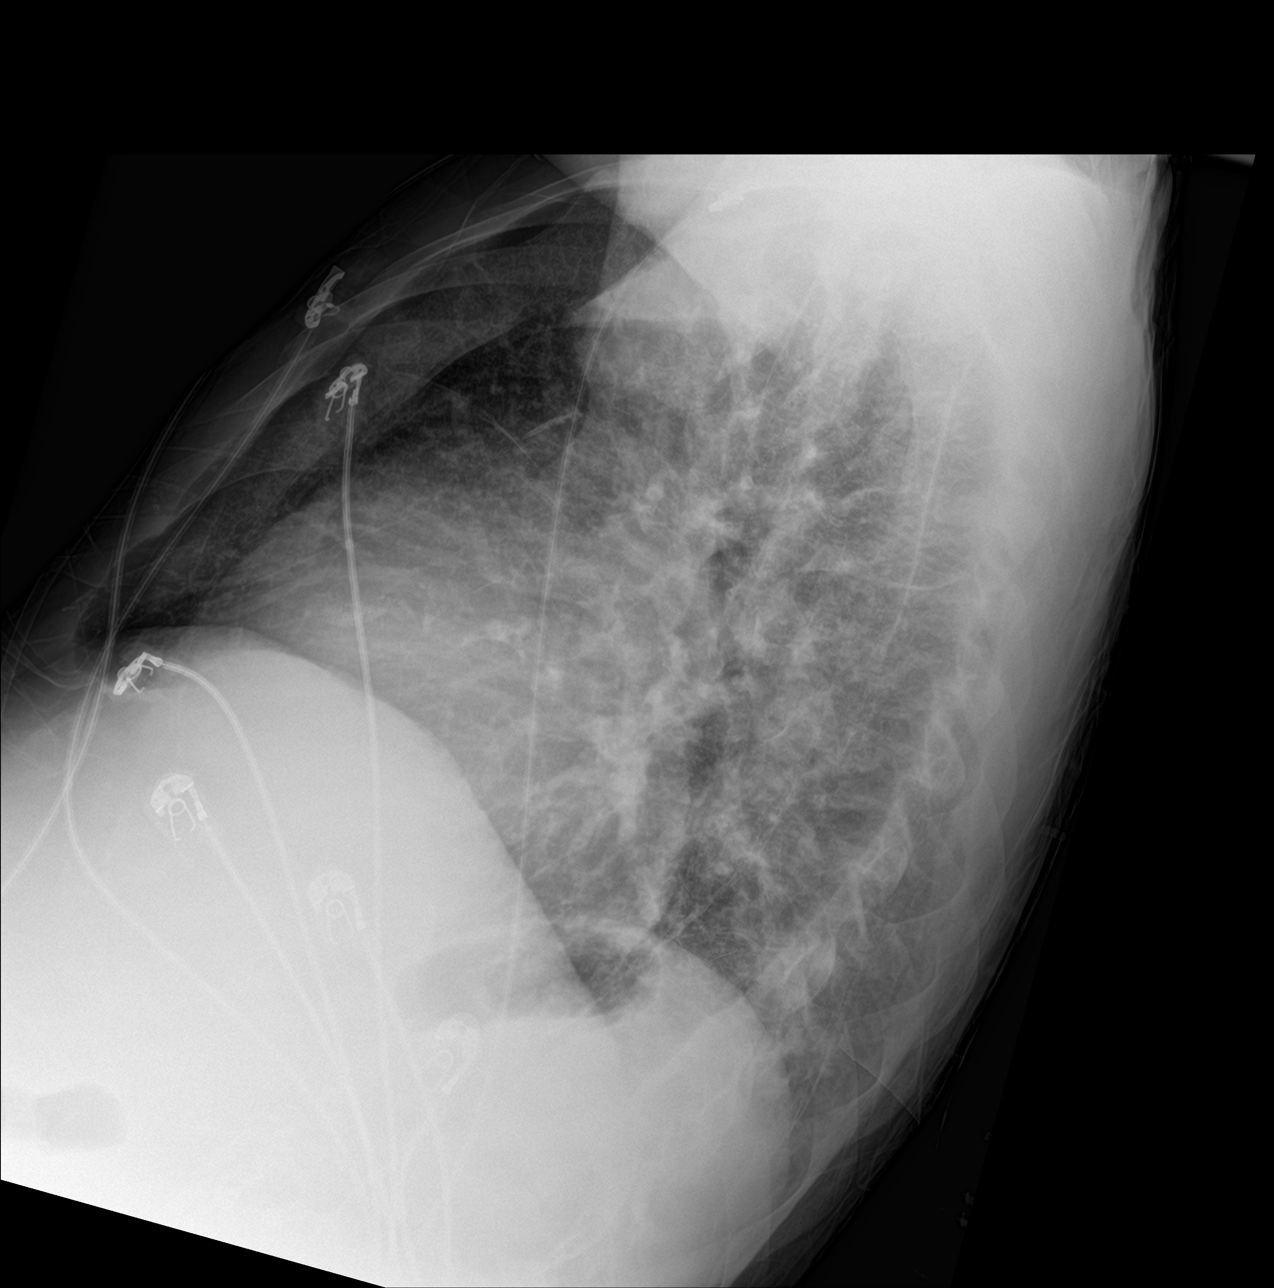

[Series 3: chest ap · 0.14mm/px · 2 of 2 slices shown]
[im 1/2]
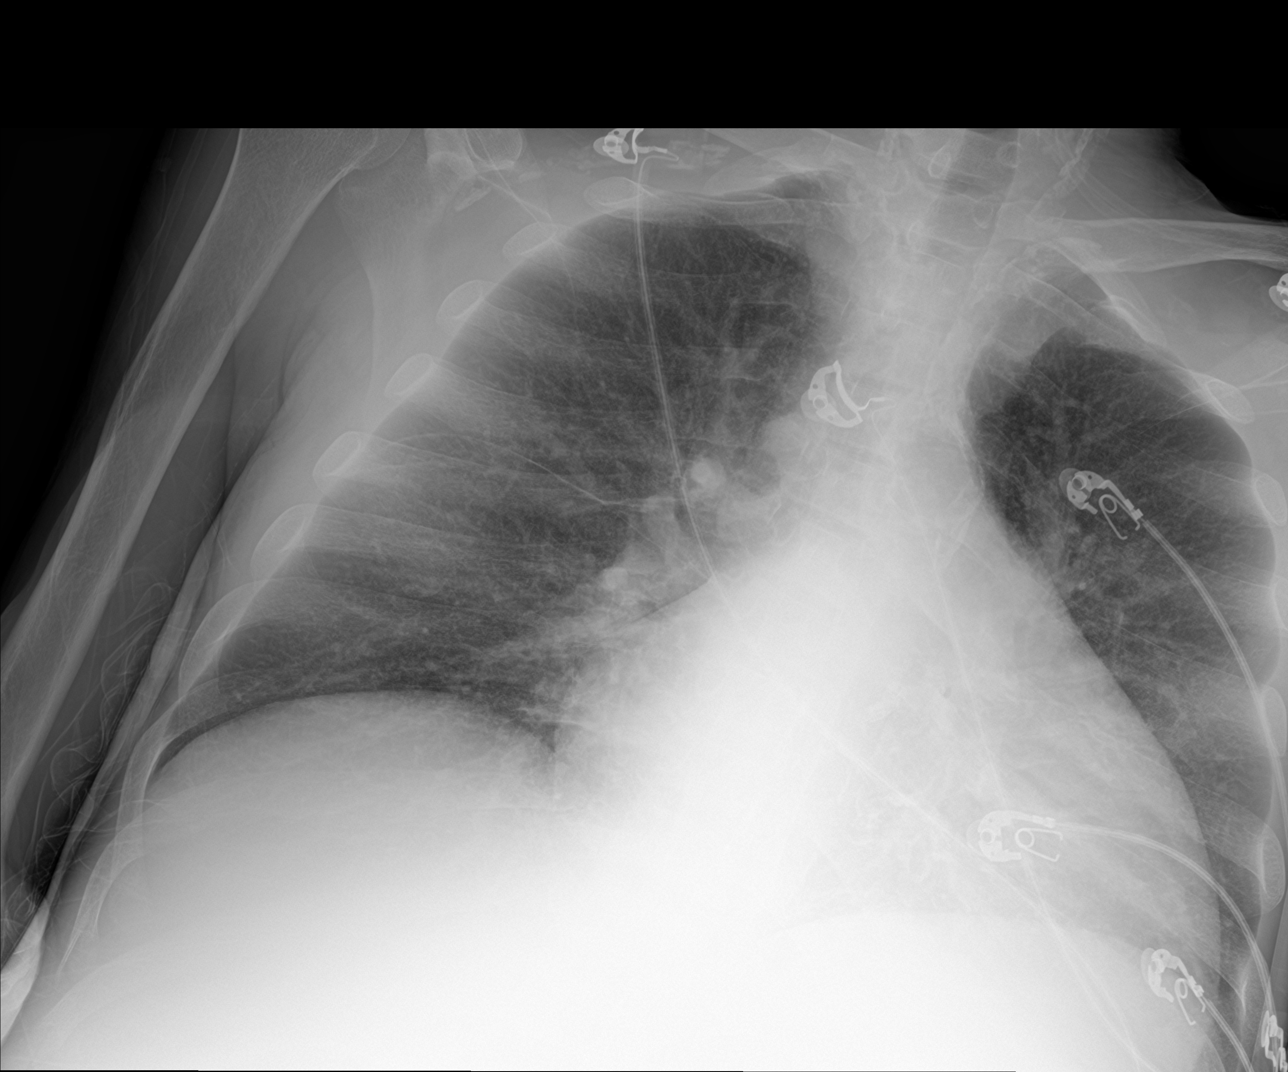
[im 2/2]
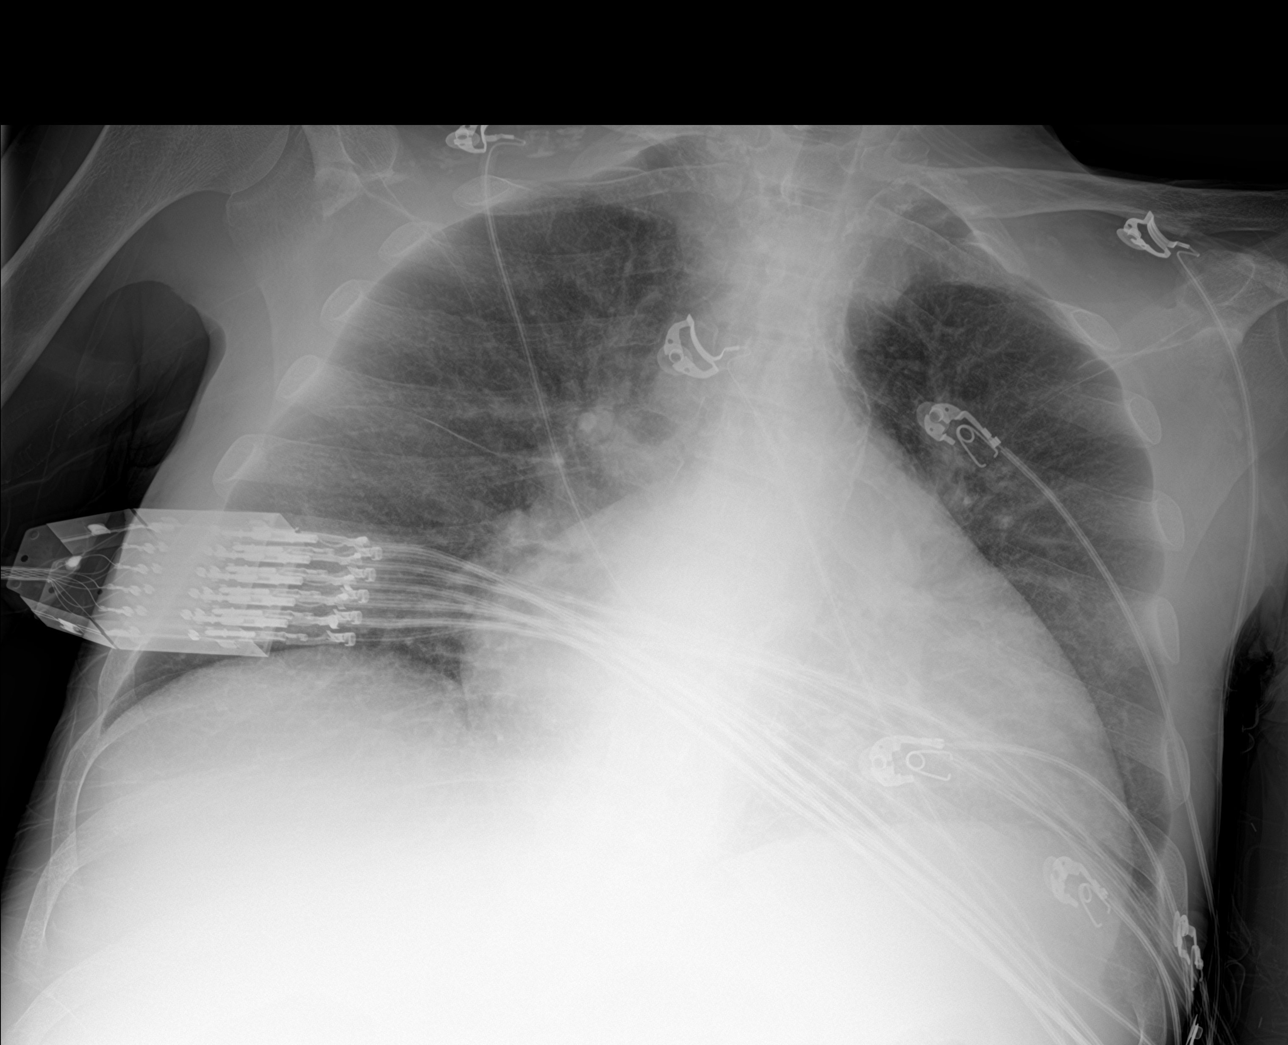

[3 of 3 positions shown; findings below may reference images not displayed]

FINDINGS: Stable cardiomegaly. Lungs are clear. No pleural effusion or
pneumothorax is seen.
IMPRESSION: 1. Lungs are clear. No evidence of pneumonia or pulmonary edema on
today's exam.
2. Stable cardiomegaly.

## 2021-07-21 IMAGING — DX DG CHEST 2V
2 series · 2 of 2 positions shown · non-contrast
Comparison: January 19, 2020

CLINICAL DATA: Burning sensation in chest.

EXAM:
CHEST - 2 VIEW

[chest pa]
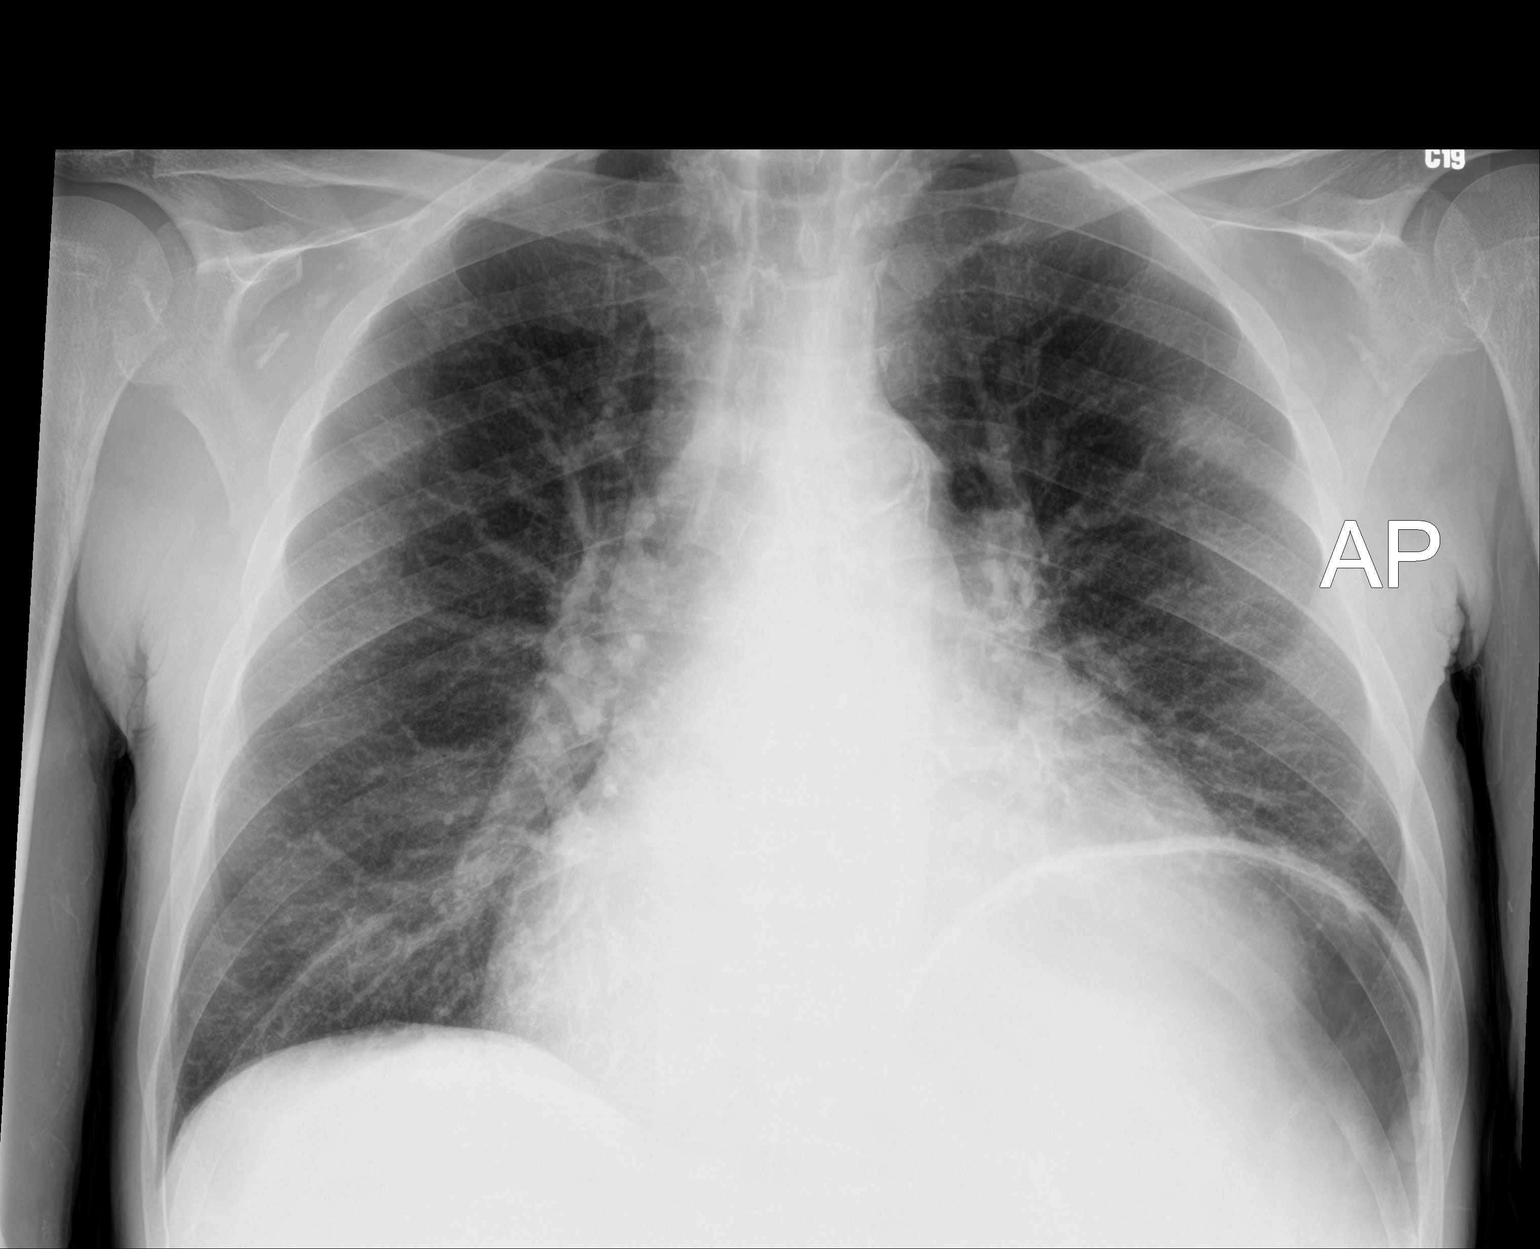

[chest lat]
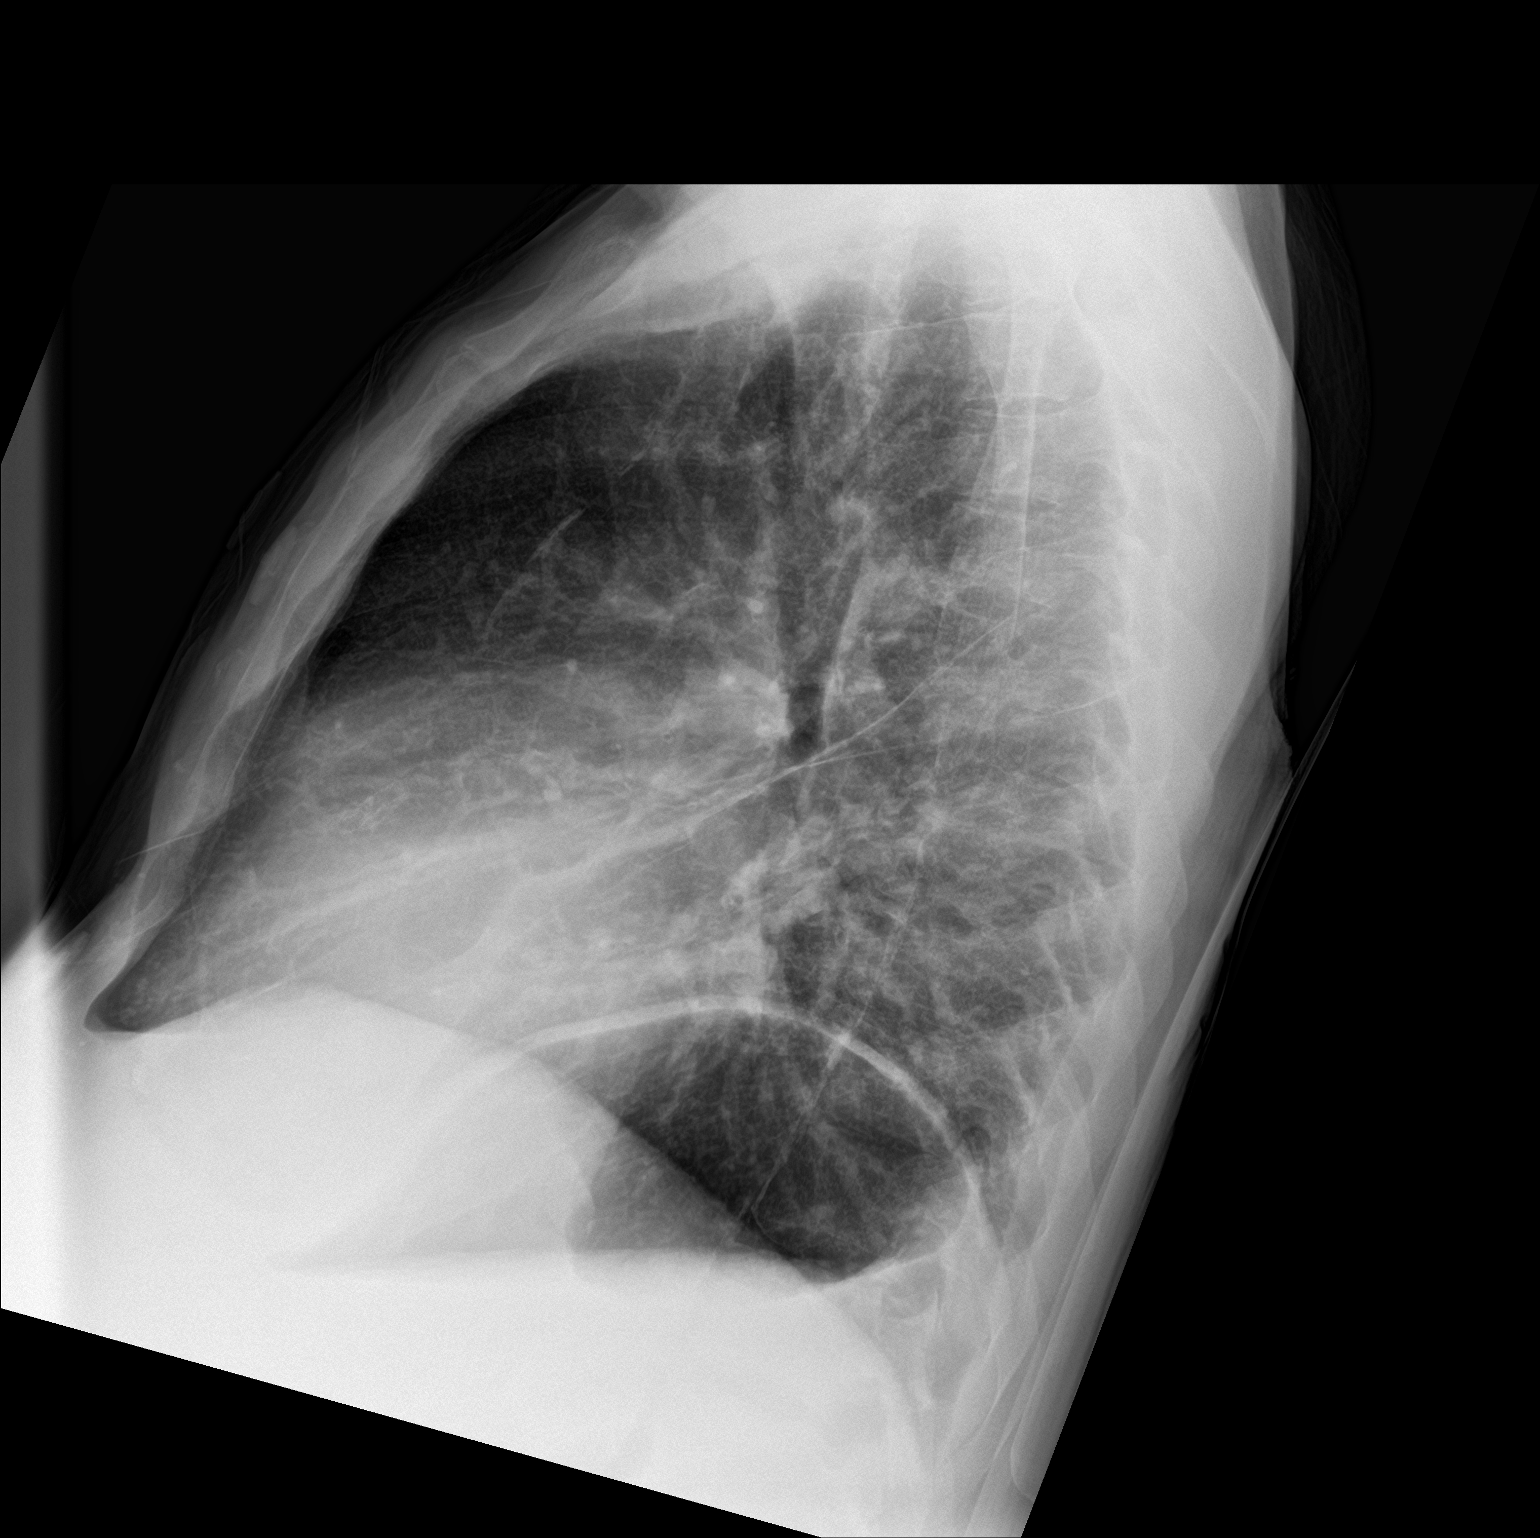

[2 of 2 positions shown; findings below may reference images not displayed]

FINDINGS: Stable cardiomegaly. Hila and mediastinum are normal. Mild haziness
in the periphery of the lungs bilaterally. Mild atelectasis in the
left base. No other acute abnormalities.
IMPRESSION: Mild haziness in the peripheries of the lungs bilaterally may
represent overlapping soft tissues or subtle developing infiltrates.
Recommend clinical correlation and close attention on follow-up.

## 2021-07-25 IMAGING — US IR PARACENTESIS
1 series · 5 of 5 positions shown · non-contrast
Comparison: none

INDICATION: End-stage renal disease, cirrhosis, hepatitis B and C, recurrent
ascites. Request for therapeutic paracentesis

[Series 1: ir (id) (id)/(id)/(id) ir · 5 of 5 slices shown]
[im 1/5]
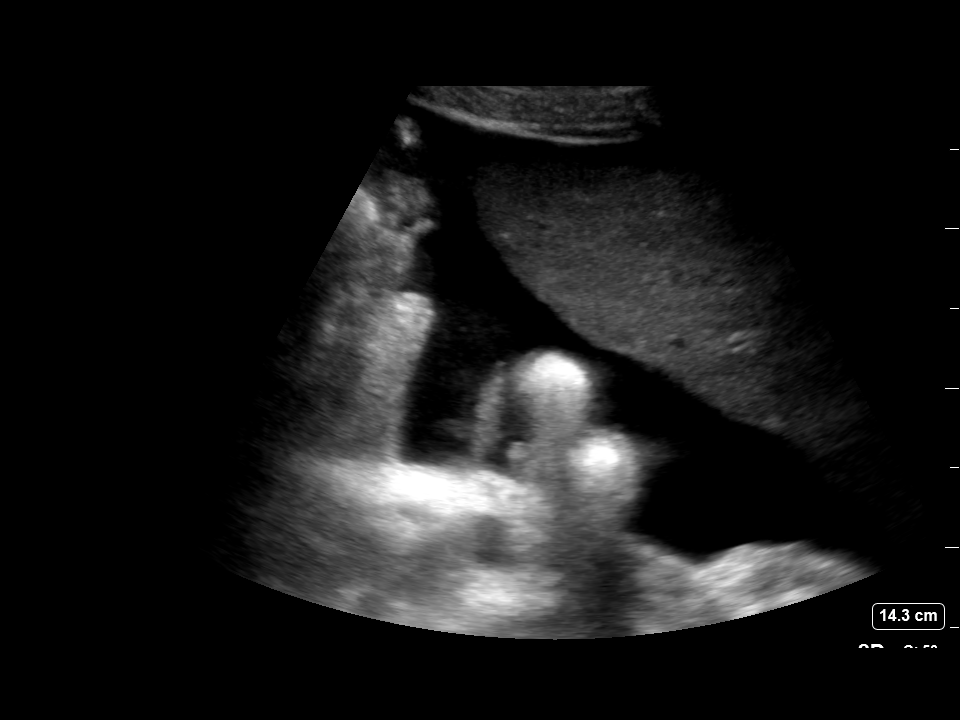
[im 2/5]
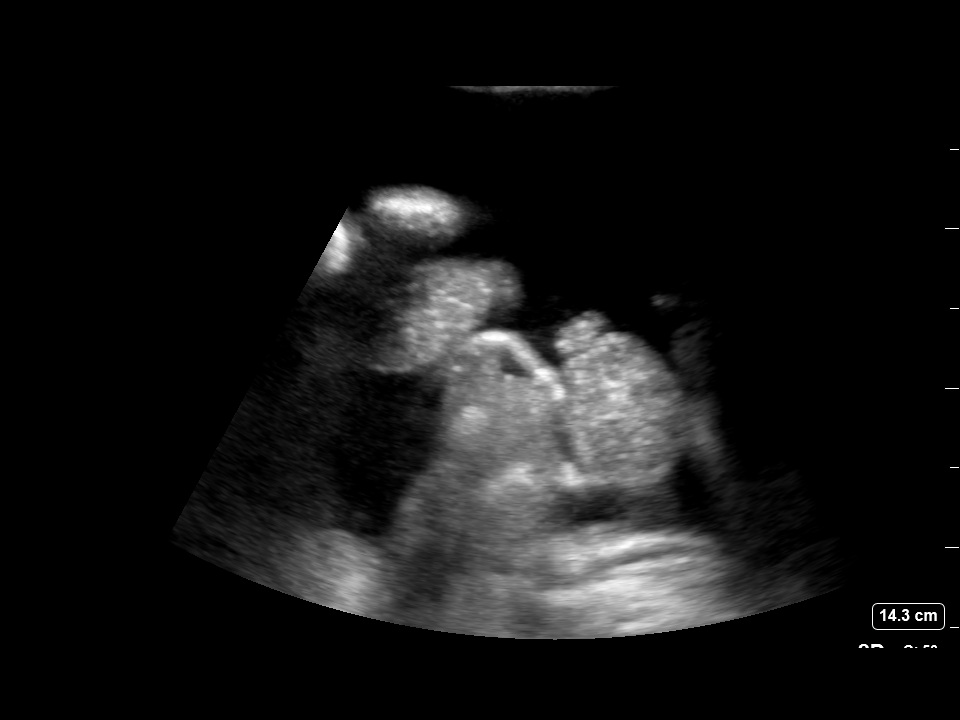
[im 3/5]
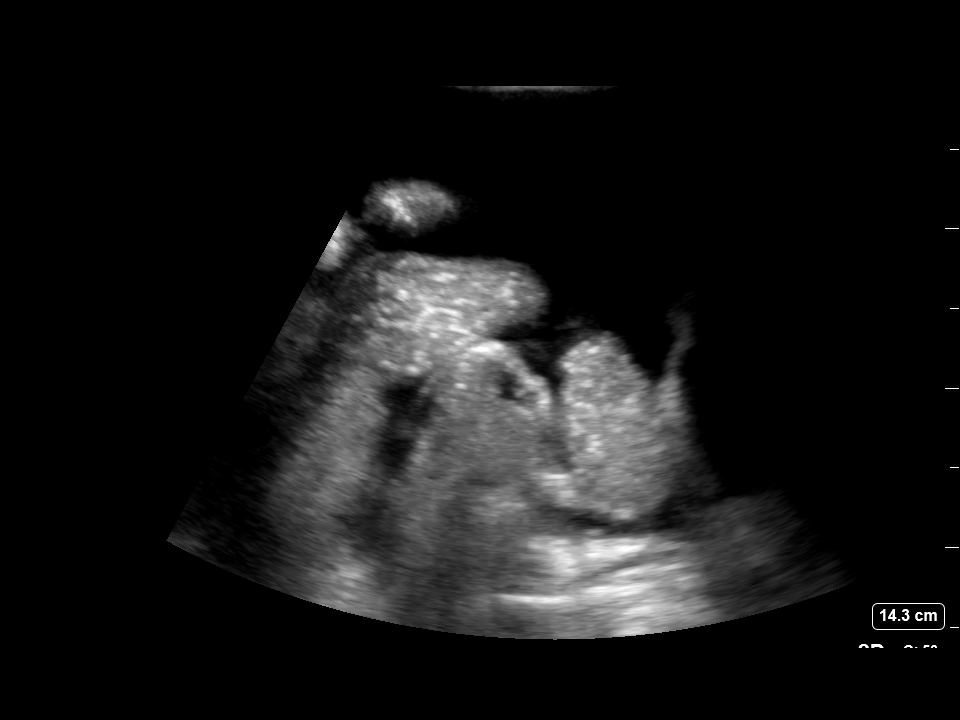
[im 4/5]
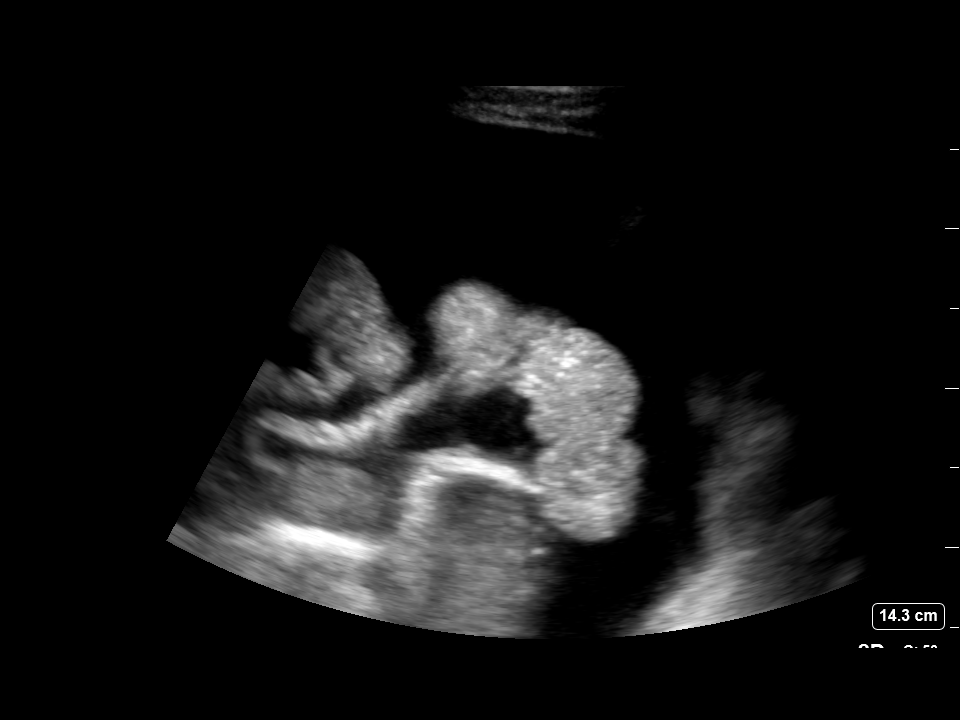
[im 5/5]
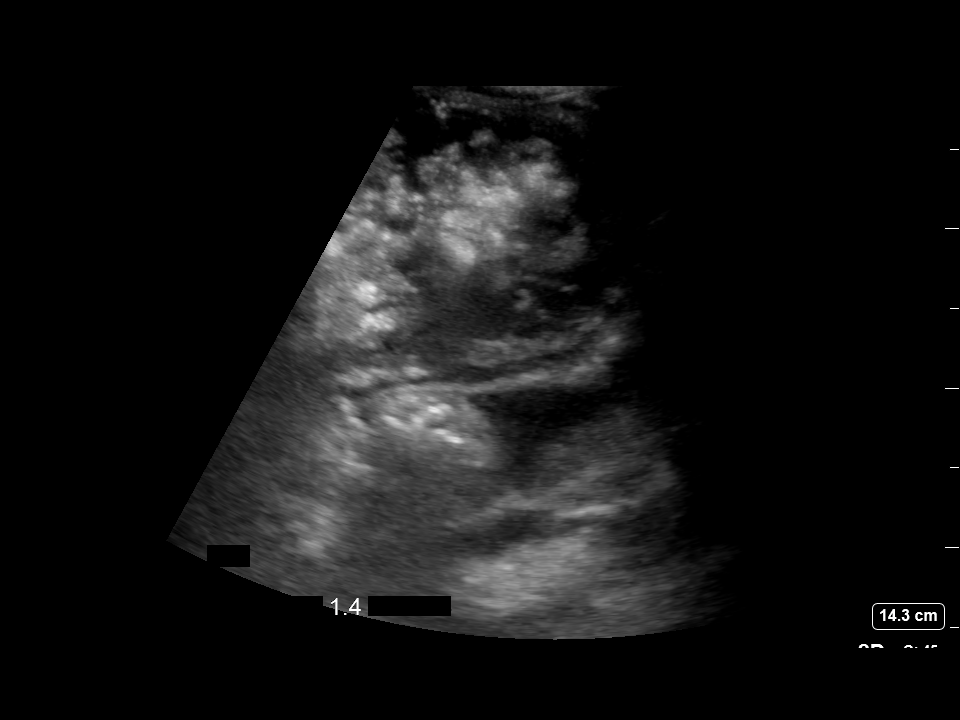

[5 of 5 positions shown; findings below may reference images not displayed]

EXAM:
ULTRASOUND GUIDED PARACENTESIS

MEDICATIONS:
1% lidocaine 15 mL

COMPLICATIONS:
None immediate.

PROCEDURE:
Informed written consent was obtained from the patient after a
discussion of the risks, benefits and alternatives to treatment. A
timeout was performed prior to the initiation of the procedure.

Initial ultrasound scanning demonstrates a moderate amount of
ascites within the left lower abdominal quadrant. The left lower
abdomen was prepped and draped in the usual sterile fashion. 1%
lidocaine was used for local anesthesia.

Following this, a 6 Fr Safe-T-Centesis catheter was introduced. An
ultrasound image was saved for documentation purposes. The
paracentesis was performed. The catheter was removed and a dressing
was applied. The patient tolerated the procedure well without
immediate post procedural complication.
FINDINGS: A total of approximately 1.4 L of amber fluid was removed.
IMPRESSION: Successful ultrasound-guided paracentesis yielding 1.4 liters of
peritoneal fluid.

## 2021-07-26 IMAGING — DX DG CHEST 1V PORT
1 series · 1 of 1 positions shown · non-contrast
Comparison: 01/20/2020

CLINICAL DATA: Shortness of breath, end-stage renal disease,
hepatitis, cirrhosis

EXAM:
PORTABLE CHEST 1 VIEW

[chest ap]
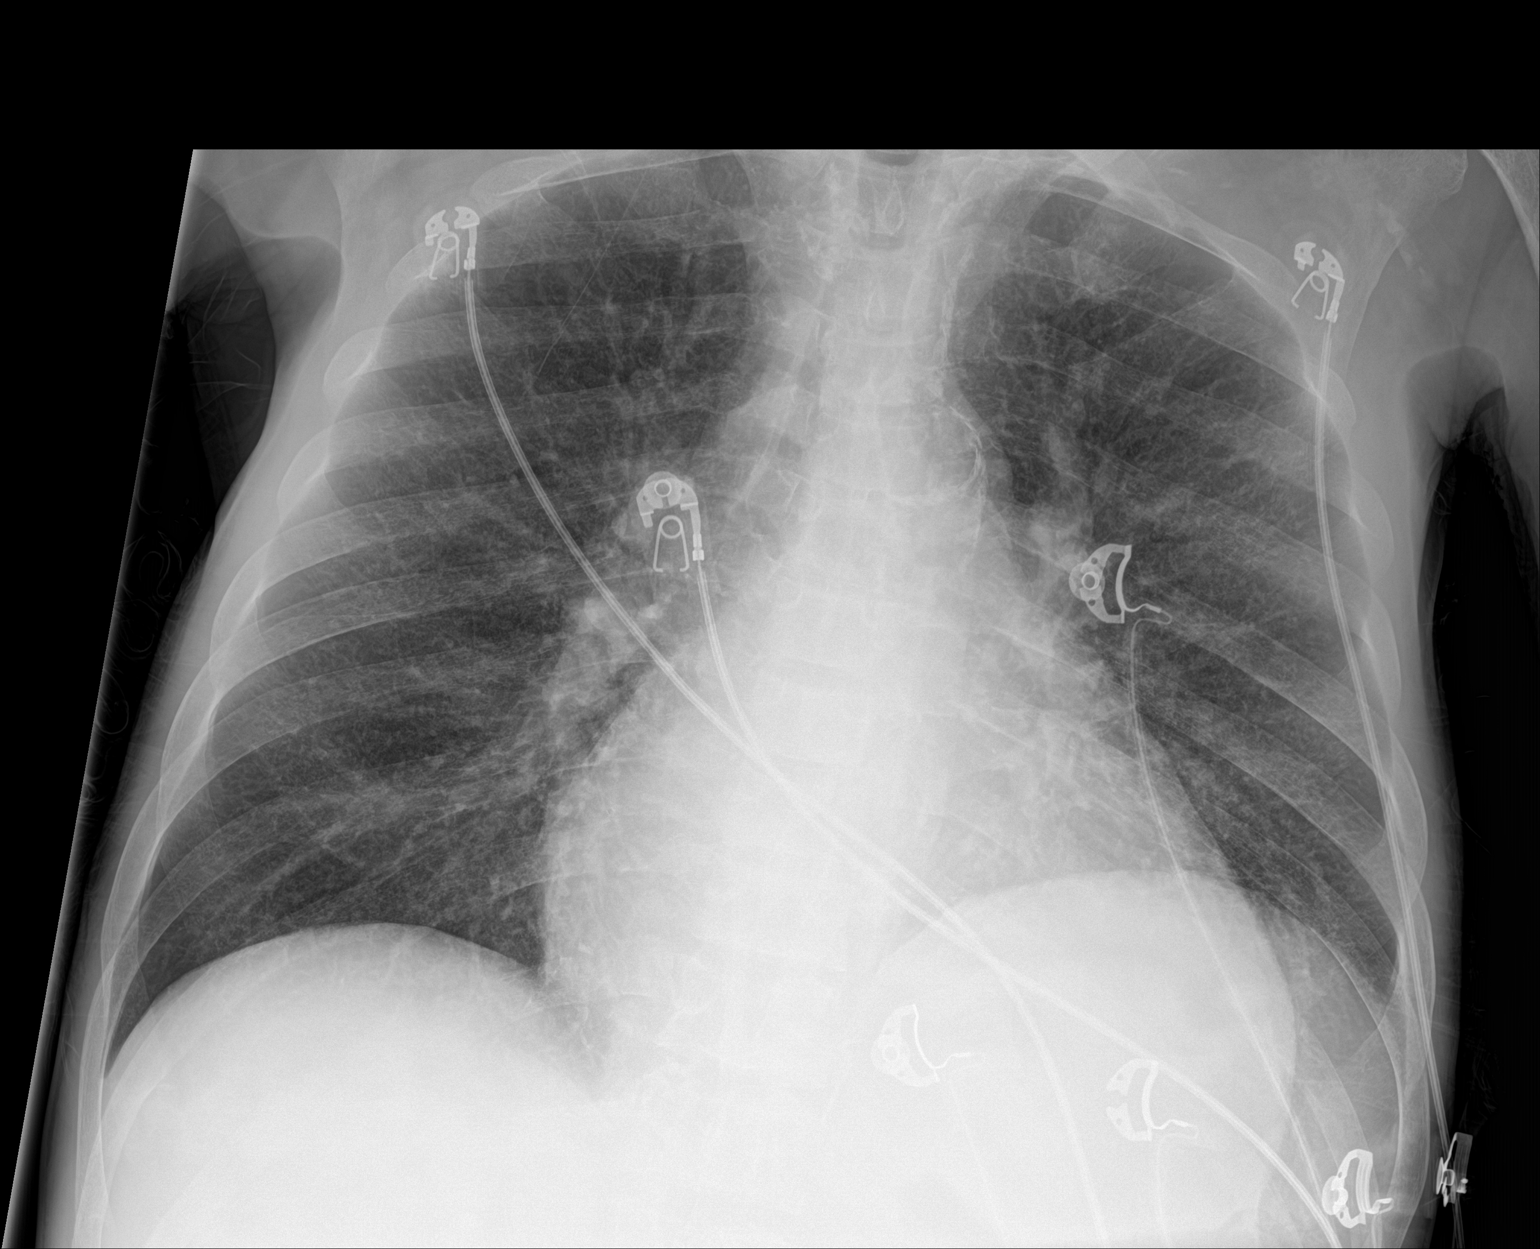

[1 of 1 positions shown; findings below may reference images not displayed]

FINDINGS: Single frontal view of the chest demonstrates persistent enlargement
of the cardiac silhouette. Diffuse increased interstitial prominence
since prior study likely reflects interstitial edema. No airspace
disease, effusion, or pneumothorax. No acute bony abnormalities.
IMPRESSION: 1. Increased interstitial prominence compatible with interstitial
edema.

## 2021-08-01 IMAGING — US IR PARACENTESIS
1 series · 1 of 1 positions shown · non-contrast
Comparison: none

INDICATION: Patient with cirrhosis, recurrent ascites. Request made for
therapeutic paracentesis.

[Series 1: (id) · 1 of 1 slices shown]
[im 1/1]
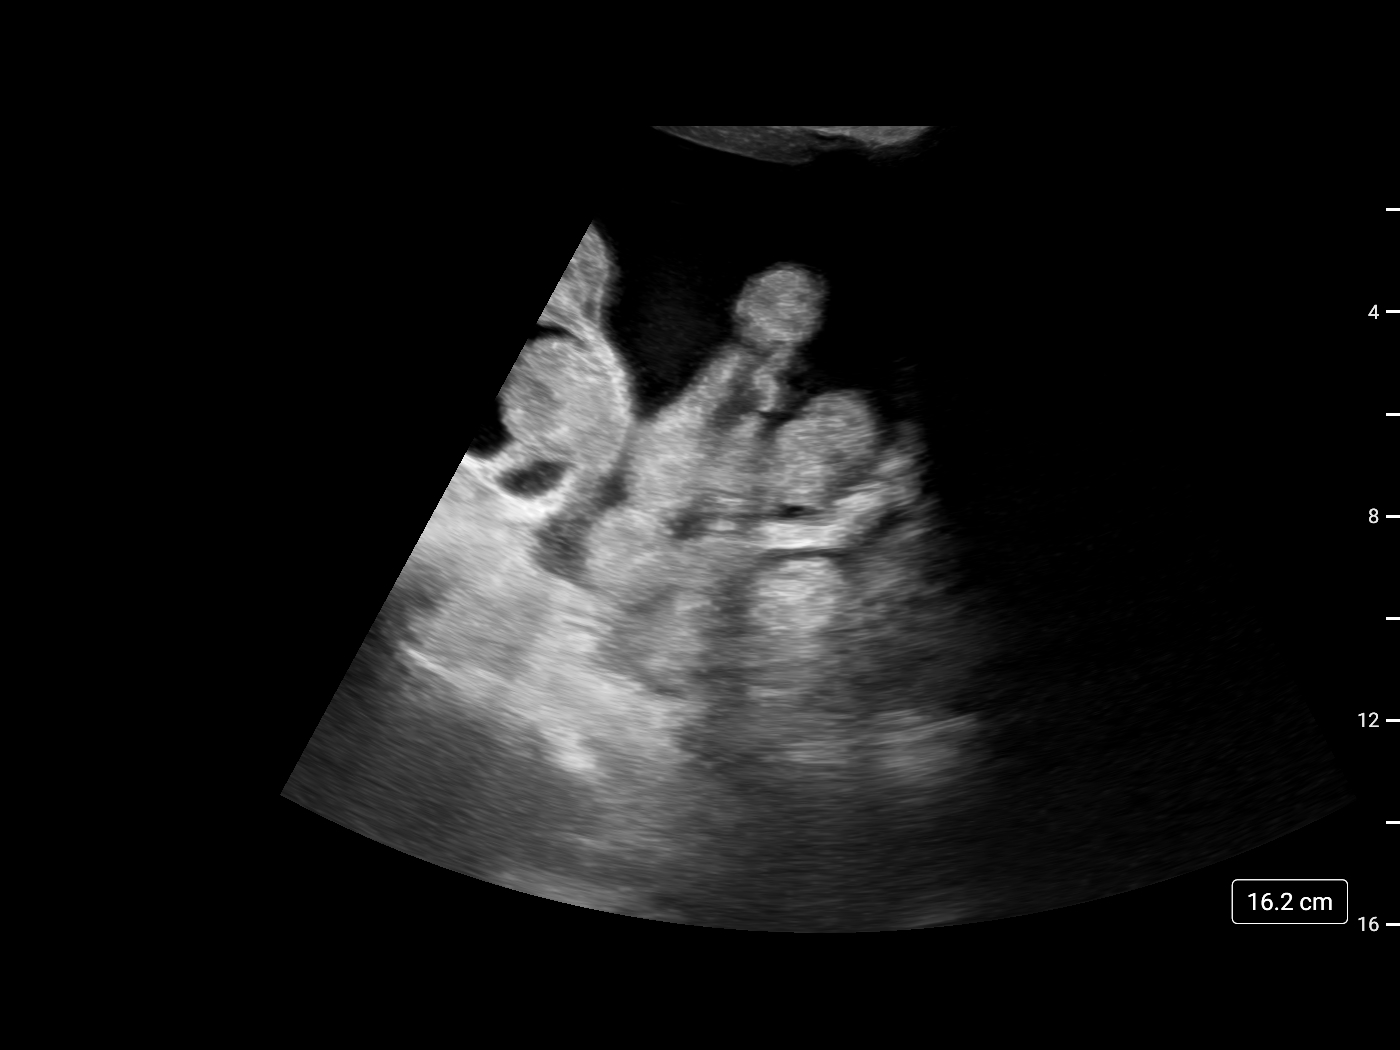

[1 of 1 positions shown; findings below may reference images not displayed]

EXAM:
ULTRASOUND GUIDED THERAPEUTIC PARACENTESIS

MEDICATIONS:
10 ML 1% LIDOCAINE

COMPLICATIONS:
None immediate.

PROCEDURE:
Informed written consent was obtained from the patient after a
discussion of the risks, benefits and alternatives to treatment. A
timeout was performed prior to the initiation of the procedure.

Initial ultrasound scanning demonstrates a small amount of ascites
within the left lateral abdomen. The left lateral abdomen was
prepped and draped in the usual sterile fashion. 1% lidocaine was
used for local anesthesia.

Following this, a 6 Fr Safe-T-Centesis catheter was introduced. An
ultrasound image was saved for documentation purposes. The
paracentesis was performed. The catheter was removed and a dressing
was applied. The patient tolerated the procedure well without
immediate post procedural complication.
FINDINGS: A total of approximately 5.0 liters of yellow fluid was removed.
Samples were sent to the laboratory as requested by the clinical
team.
IMPRESSION: Successful ultrasound-guided therapeutic paracentesis yielding
liters of peritoneal fluid.

## 2021-08-04 IMAGING — CR DG CHEST 2V
2 series · 2 of 2 positions shown · non-contrast
Comparison: Chest radiograph dated 01/25/2020.

CLINICAL DATA: 59-year-old male with chest pain and shortness of
breath.

EXAM:
CHEST - 2 VIEW

[chest ap]
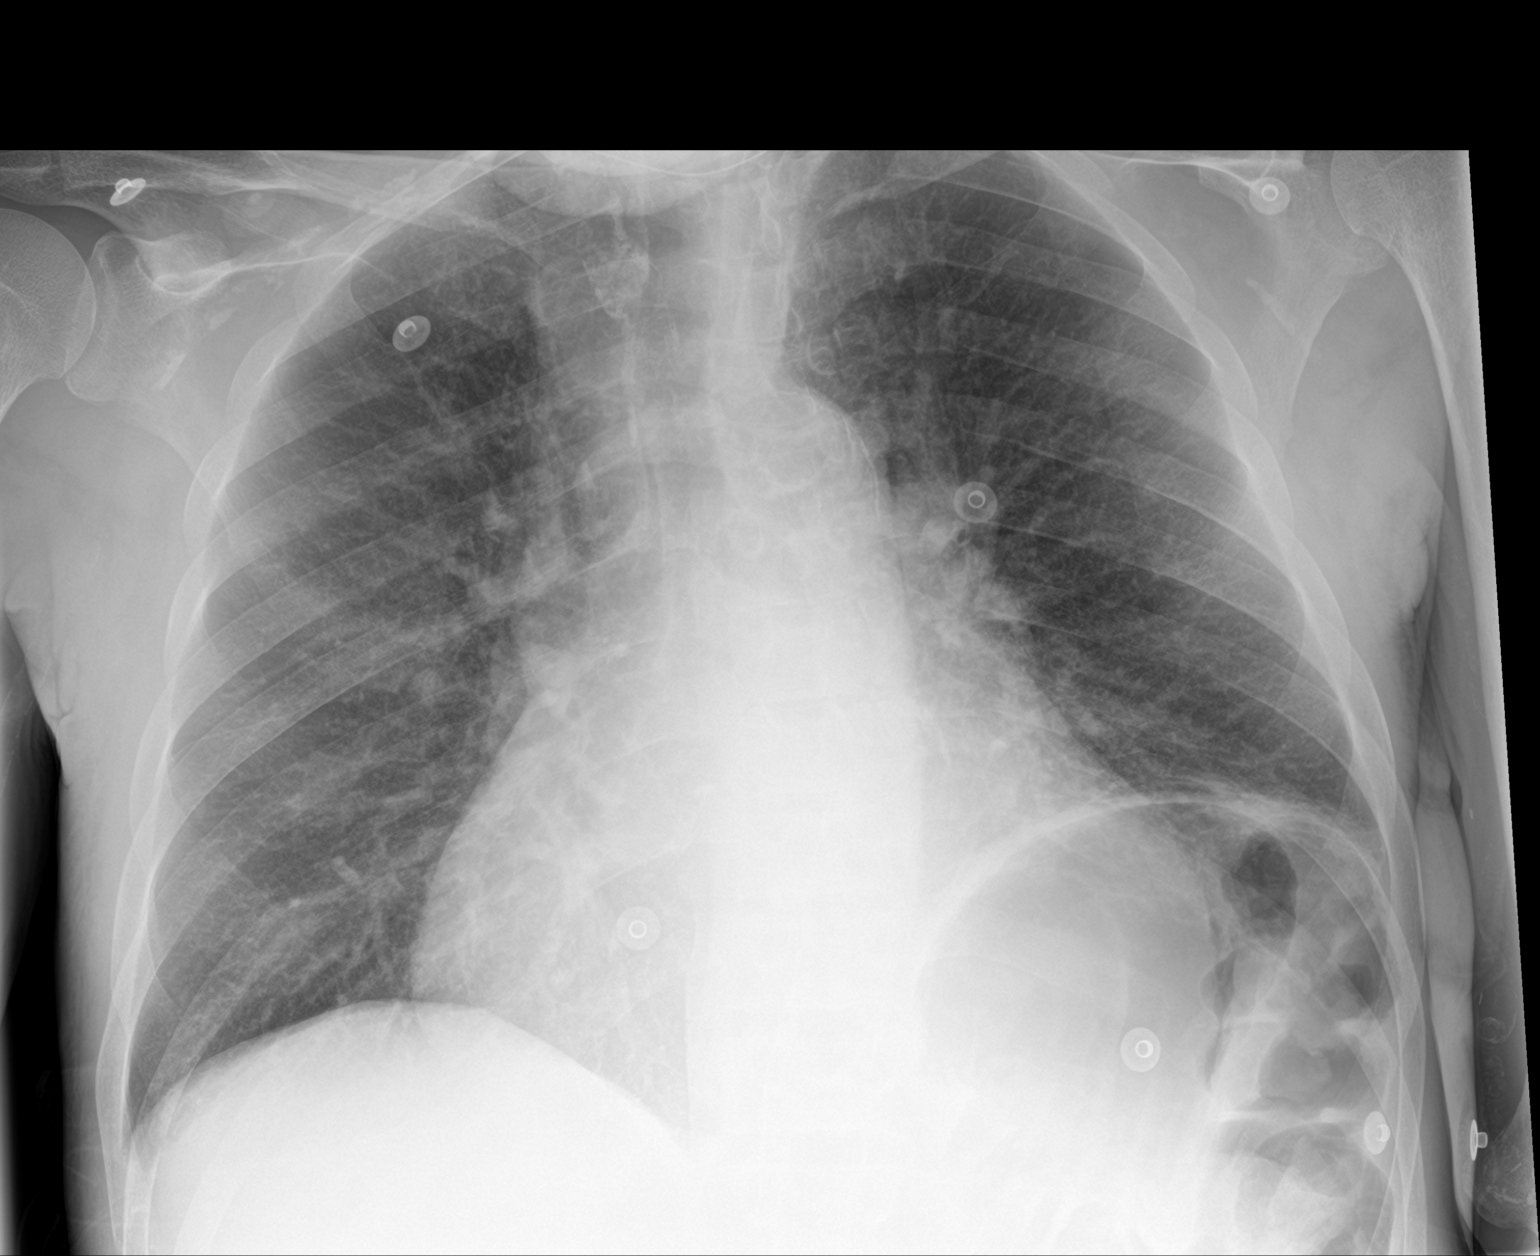

[chest lat]
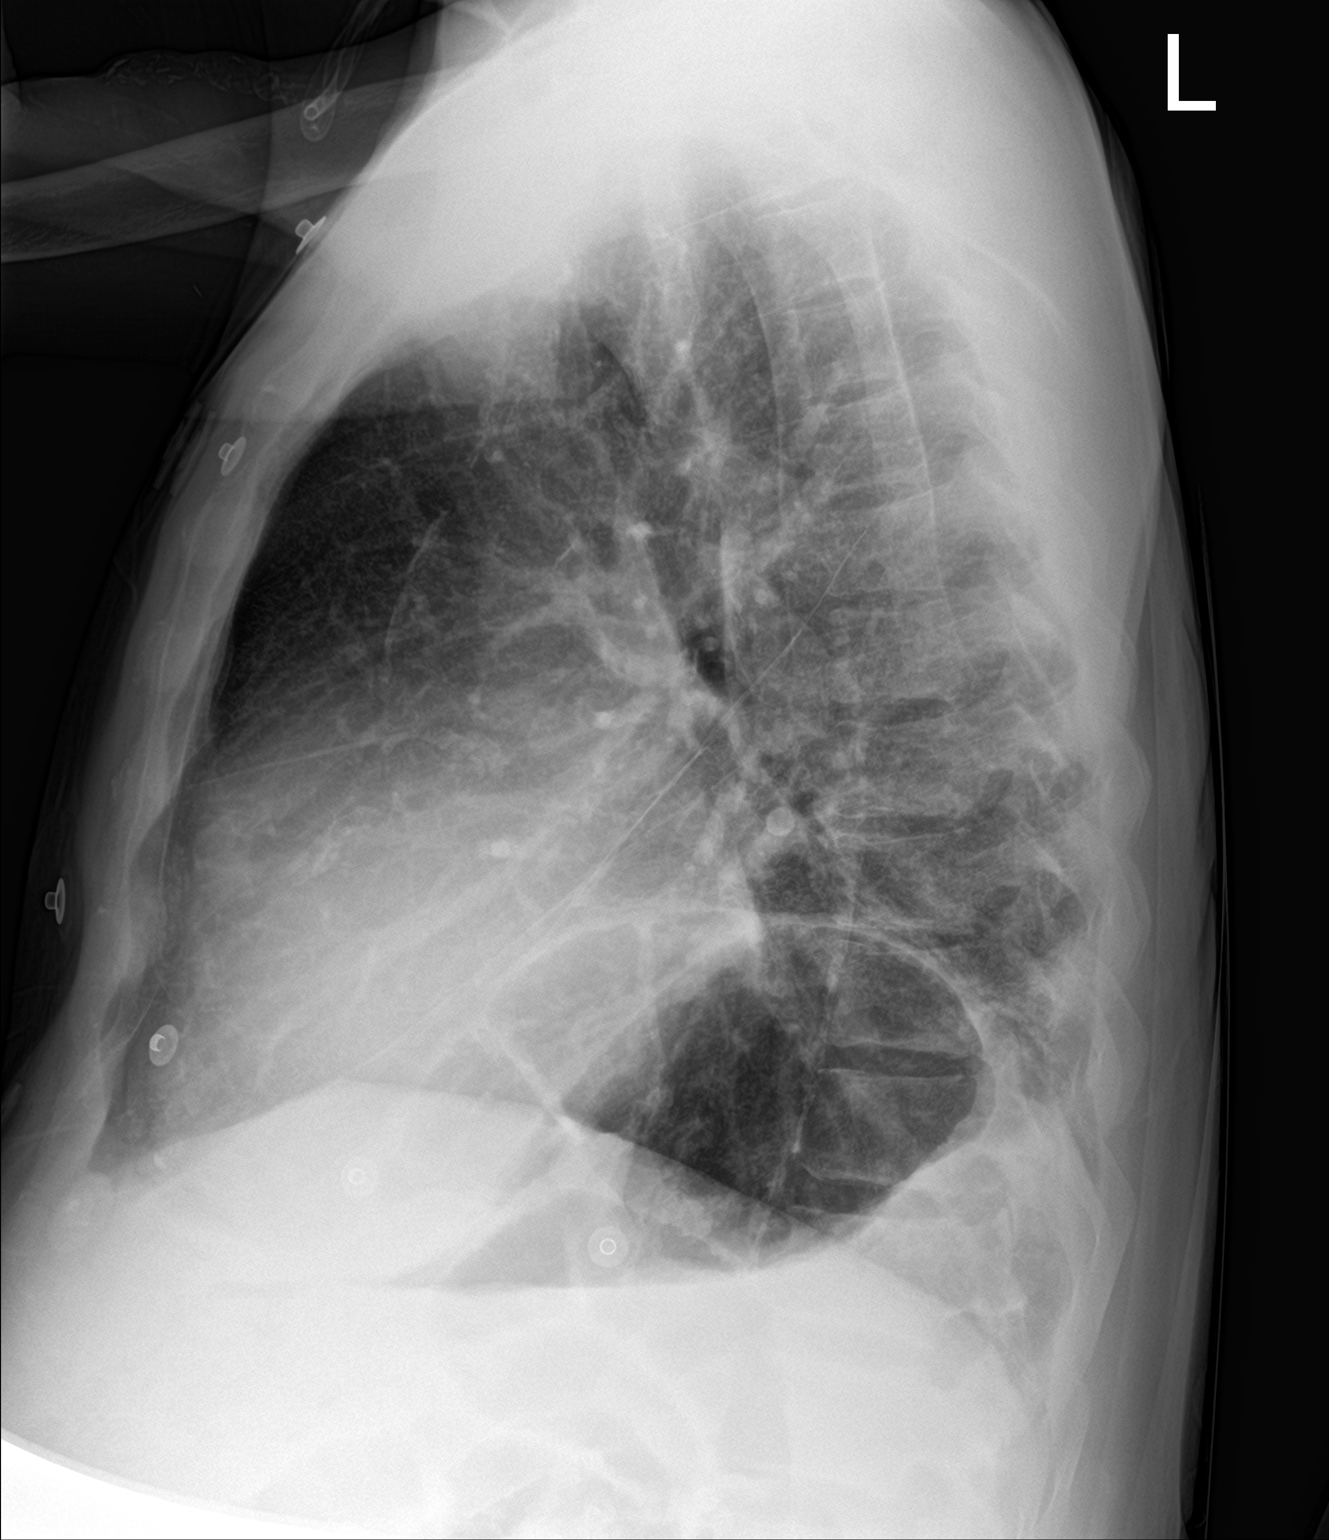

[2 of 2 positions shown; findings below may reference images not displayed]

FINDINGS: There is mild eventration of the left hemidiaphragm with left lung
base atelectasis. There is no focal consolidation, pleural effusion,
or pneumothorax. Stable cardiomegaly with probable mild vascular
congestion. No edema. Atherosclerotic calcification of the aorta. No
acute osseous pathology.
IMPRESSION: Cardiomegaly with probable mild vascular congestion. No focal
consolidation.

## 2021-08-08 IMAGING — US IR PARACENTESIS
1 series · 3 of 3 positions shown · non-contrast
Comparison: none

INDICATION: End-stage renal disease, cirrhosis, hepatitis B and C, recurrent
ascites. Request for therapeutic paracentesis.

[Series 1: ir (id) (id)/(id)/(id) ir · 3 of 3 slices shown]
[im 1/3]
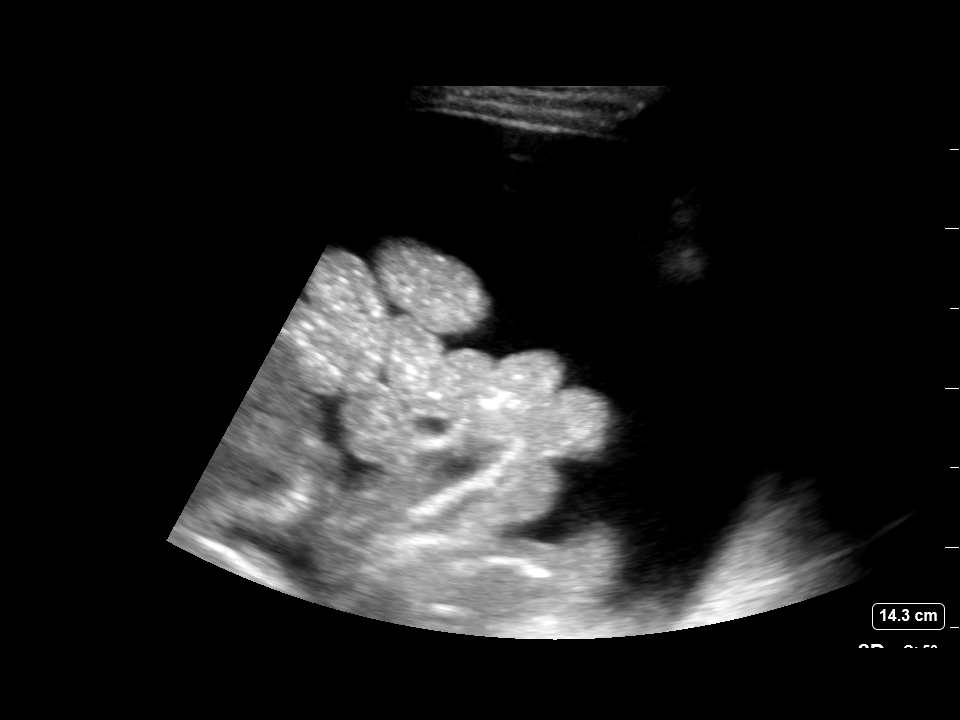
[im 2/3]
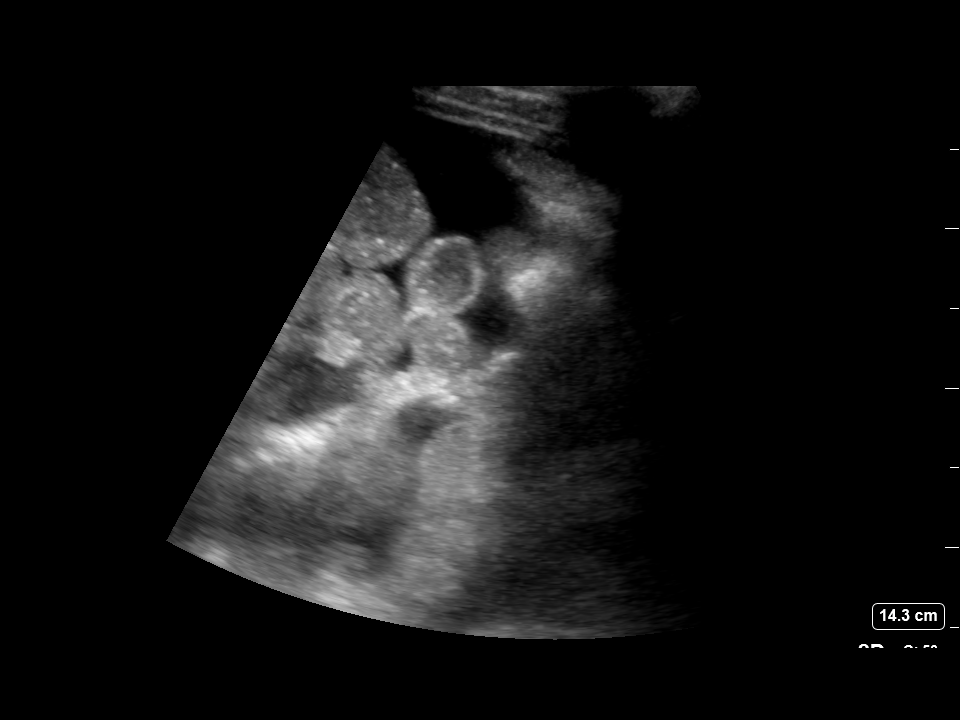
[im 3/3]
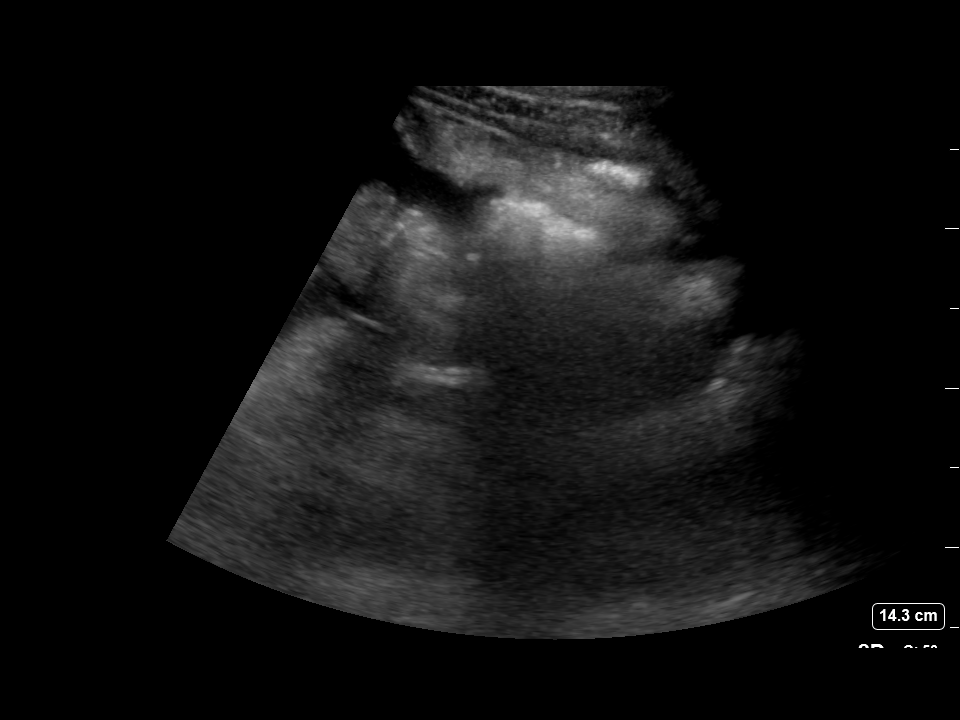

[3 of 3 positions shown; findings below may reference images not displayed]

EXAM:
ULTRASOUND GUIDED PARACENTESIS

MEDICATIONS:
1% lidocaine 10 mL

COMPLICATIONS:
None immediate.

PROCEDURE:
Informed written consent was obtained from the patient after a
discussion of the risks, benefits and alternatives to treatment. A
timeout was performed prior to the initiation of the procedure.

Initial ultrasound scanning demonstrates a moderate amount of
ascites within the left lateral abdomen. The left lateral abdomen
was prepped and draped in the usual sterile fashion. 1% lidocaine
was used for local anesthesia.

Following this, a 19 gauge, 7-cm, Yueh catheter was introduced. An
ultrasound image was saved for documentation purposes. The
paracentesis was performed. The catheter was removed and a dressing
was applied. The patient tolerated the procedure well without
immediate post procedural complication.
FINDINGS: A total of approximately 780 mL of clear yellow fluid was removed.
IMPRESSION: Successful ultrasound-guided paracentesis yielding 780 mL of
peritoneal fluid.

## 2021-08-09 IMAGING — DX DG CHEST 1V PORT
1 series · 2 of 2 positions shown · non-contrast
Comparison: February 03, 2020

CLINICAL DATA: V-tach

EXAM:
PORTABLE CHEST 1 VIEW

[Series 1: chest ap · 0.14mm/px · 2 of 2 slices shown]
[im 1/2]
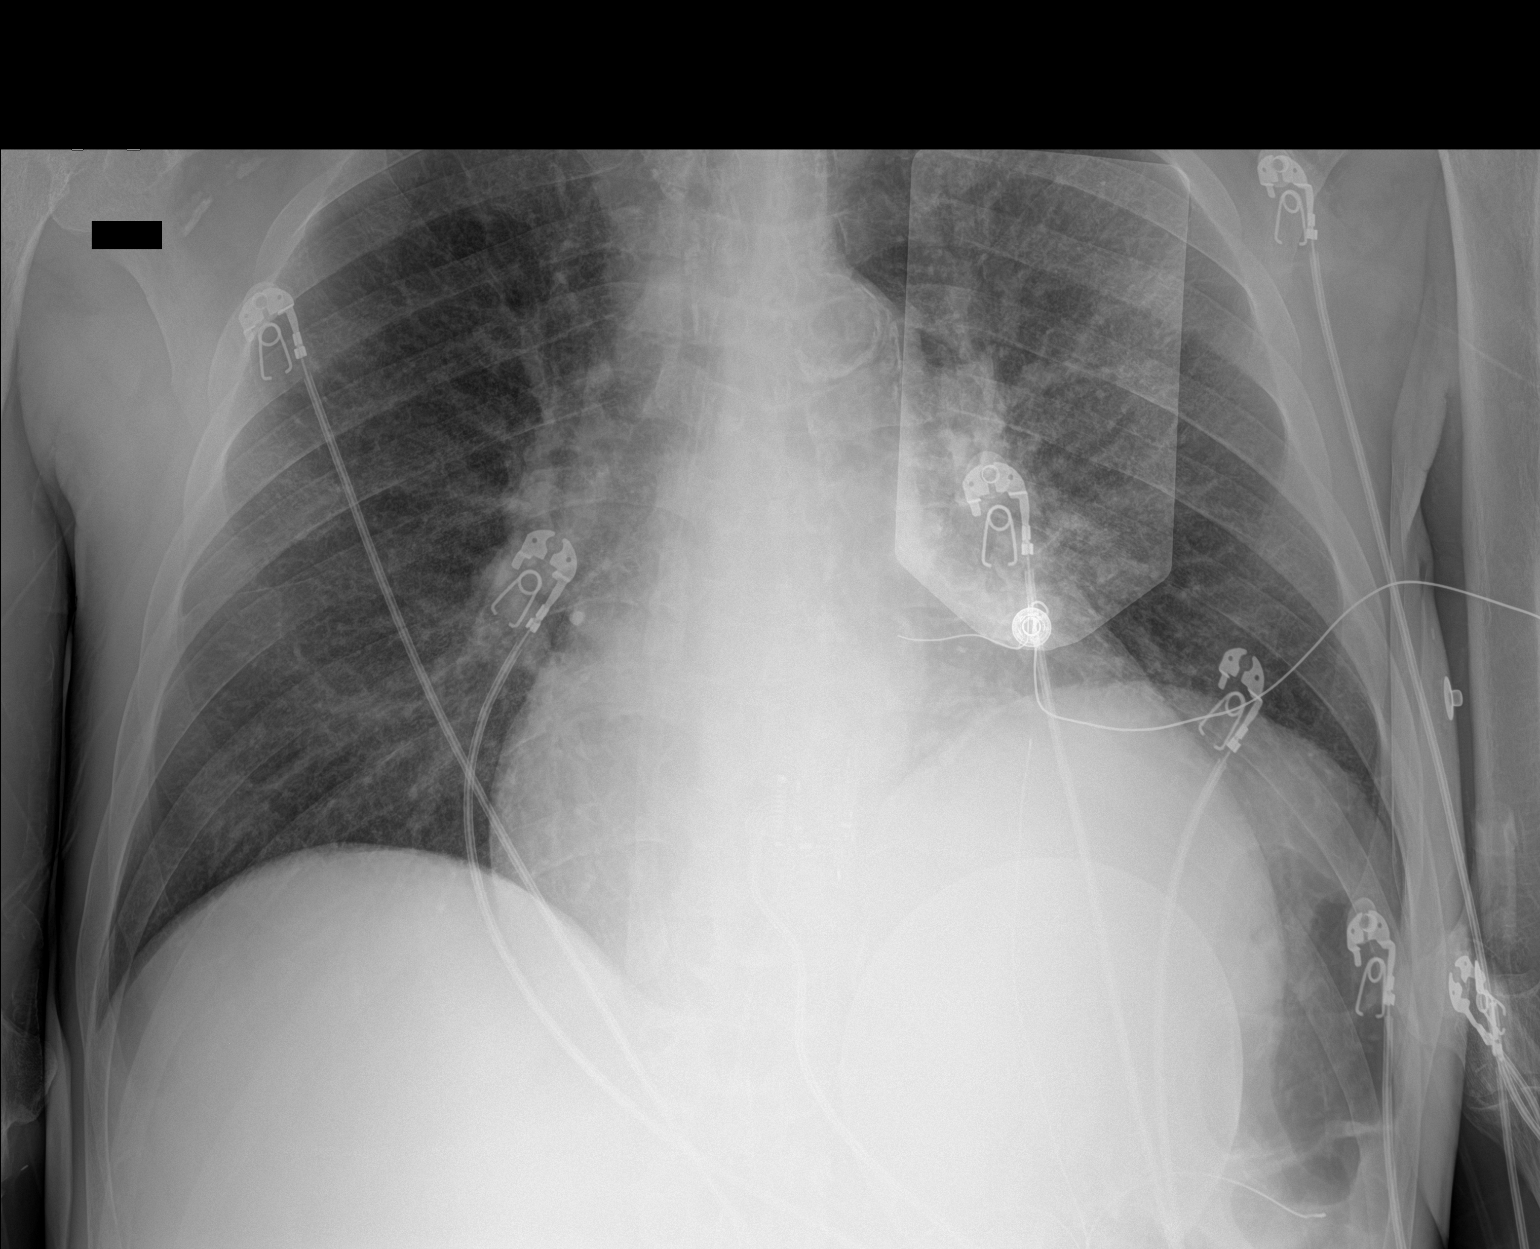
[im 2/2]
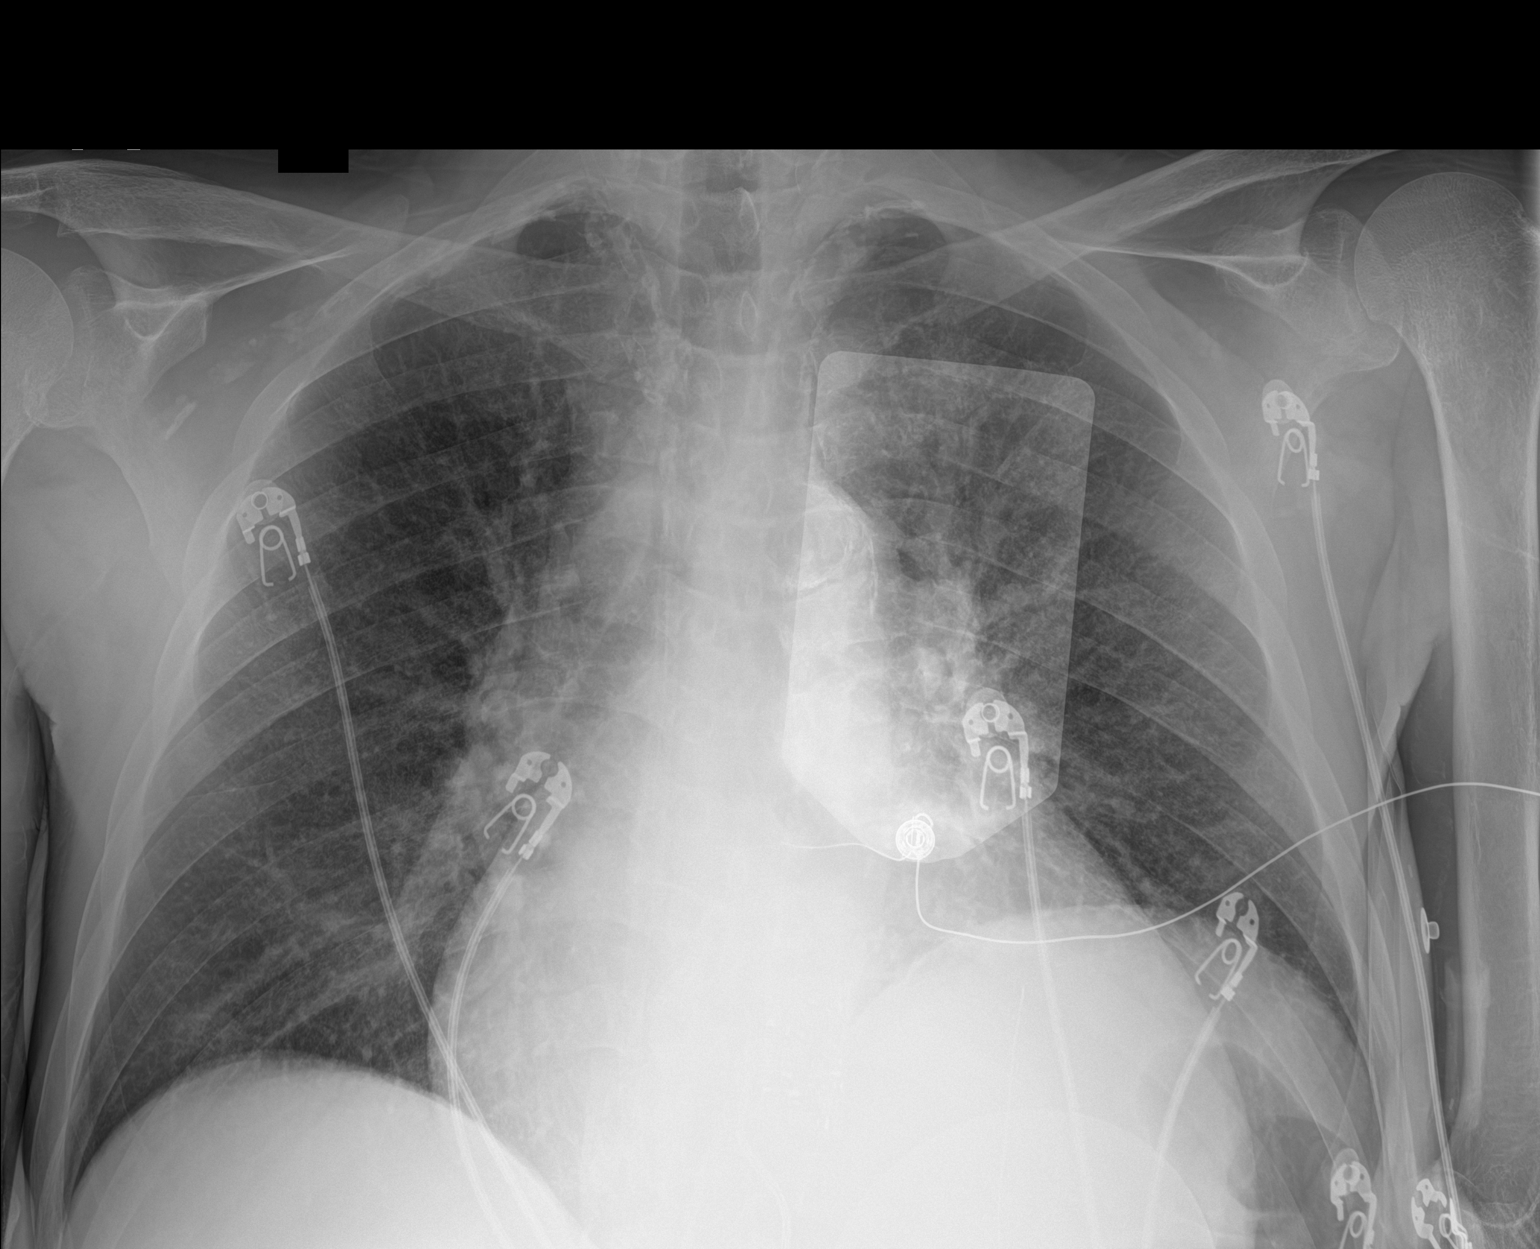

[2 of 2 positions shown; findings below may reference images not displayed]

FINDINGS: Again noted is mild cardiomegaly. Aortic knob calcifications. There
is prominence of the central pulmonary vasculature. No large
airspace consolidation or pleural effusion. There is stable
elevation of the left hemidiaphragm.
IMPRESSION: Mild cardiomegaly and pulmonary vascular congestion.

## 2021-08-10 IMAGING — DX DG CHEST 2V
2 series · 2 of 2 positions shown · non-contrast
Comparison: 02/08/2020 and earlier.

CLINICAL DATA: 59-year-old with palpitations, generalized weakness
and shortness of breath. The patient was seen in the emergency
department yesterday for atrial fibrillation with rapid ventricular
response, and recurrence of the atrial fibrillation was noted upon
EMS arrival. Patient had successful cardioversion with Cardizem
after unsuccessful attempts with adenosine.

EXAM:
CHEST - 2 VIEW

[chest lat]
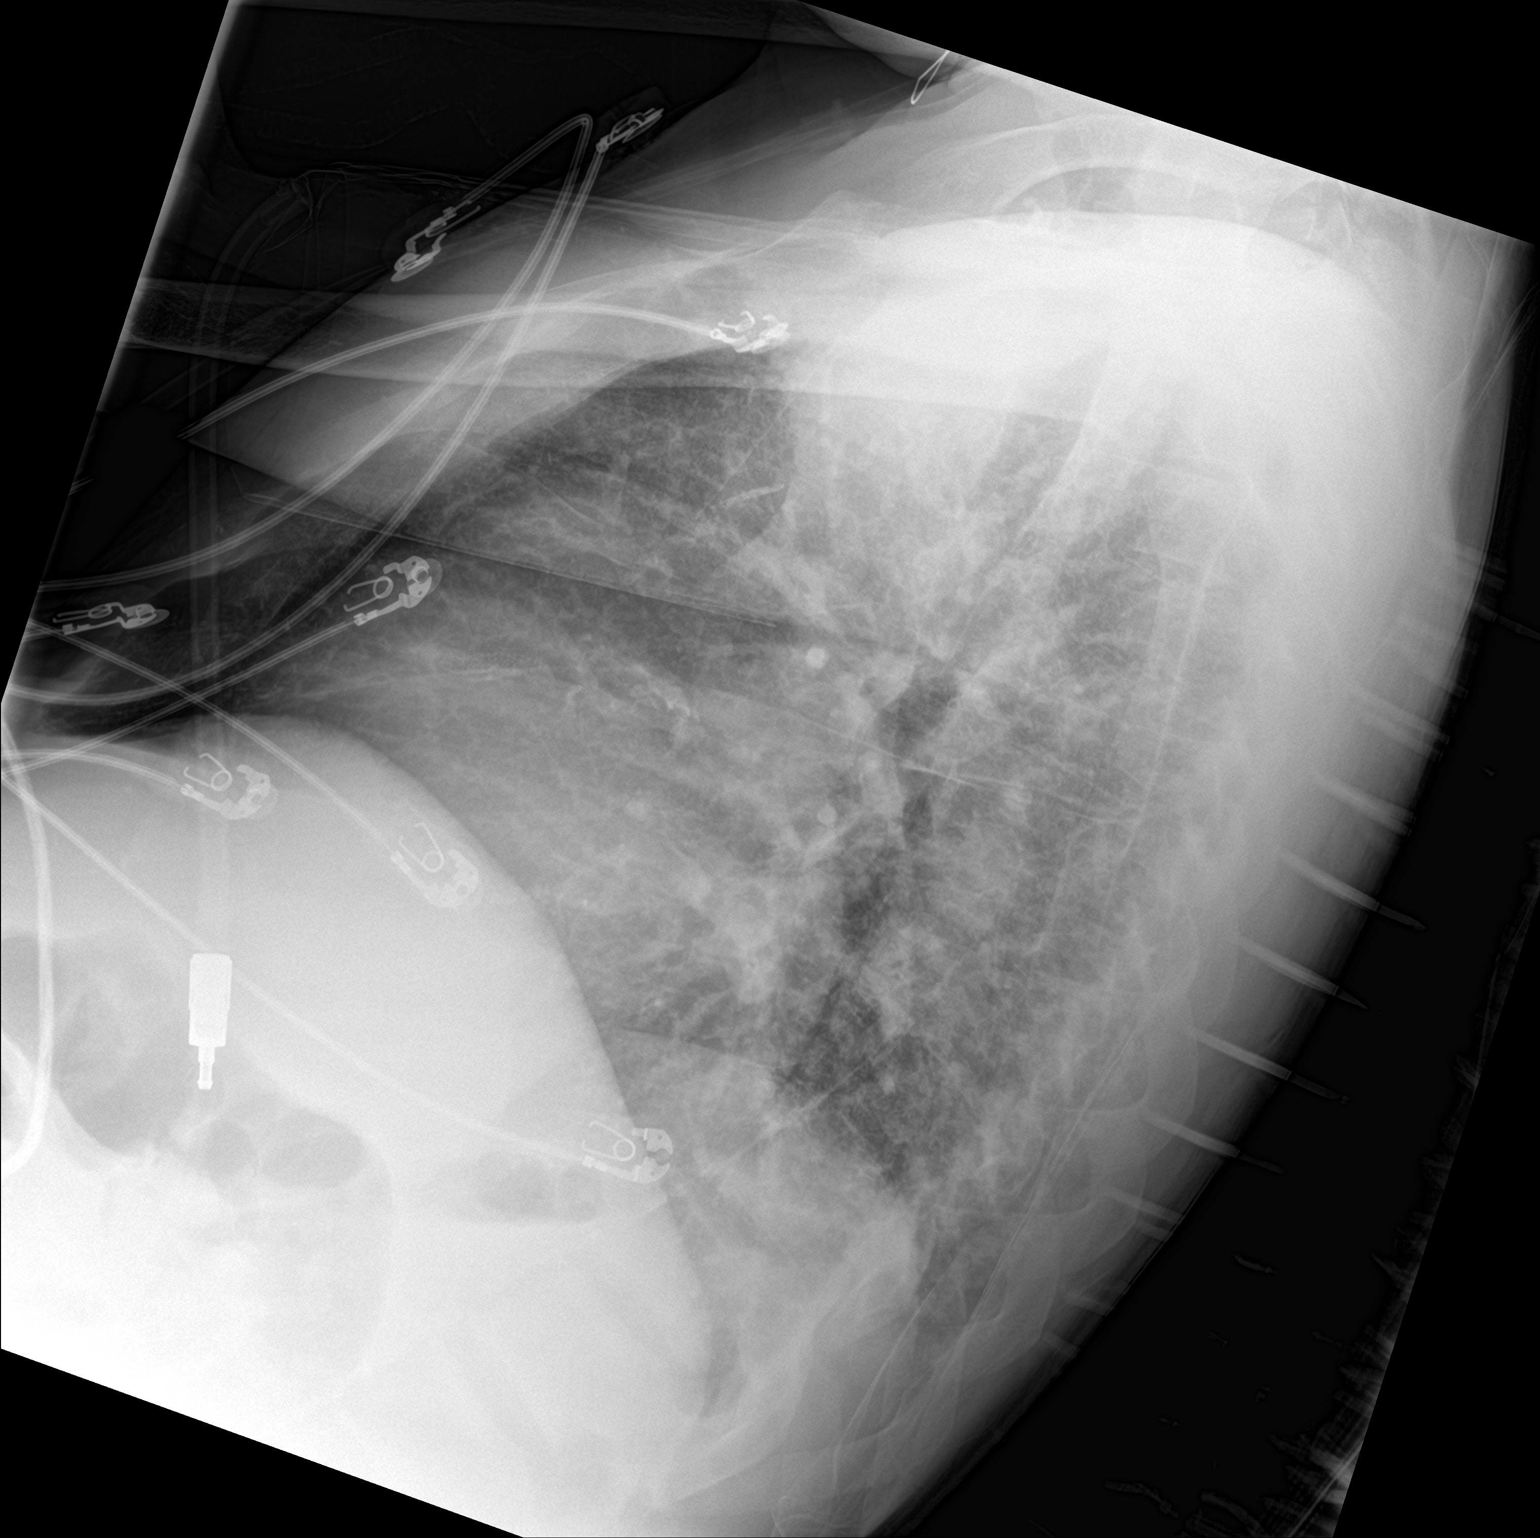

[chest ap]
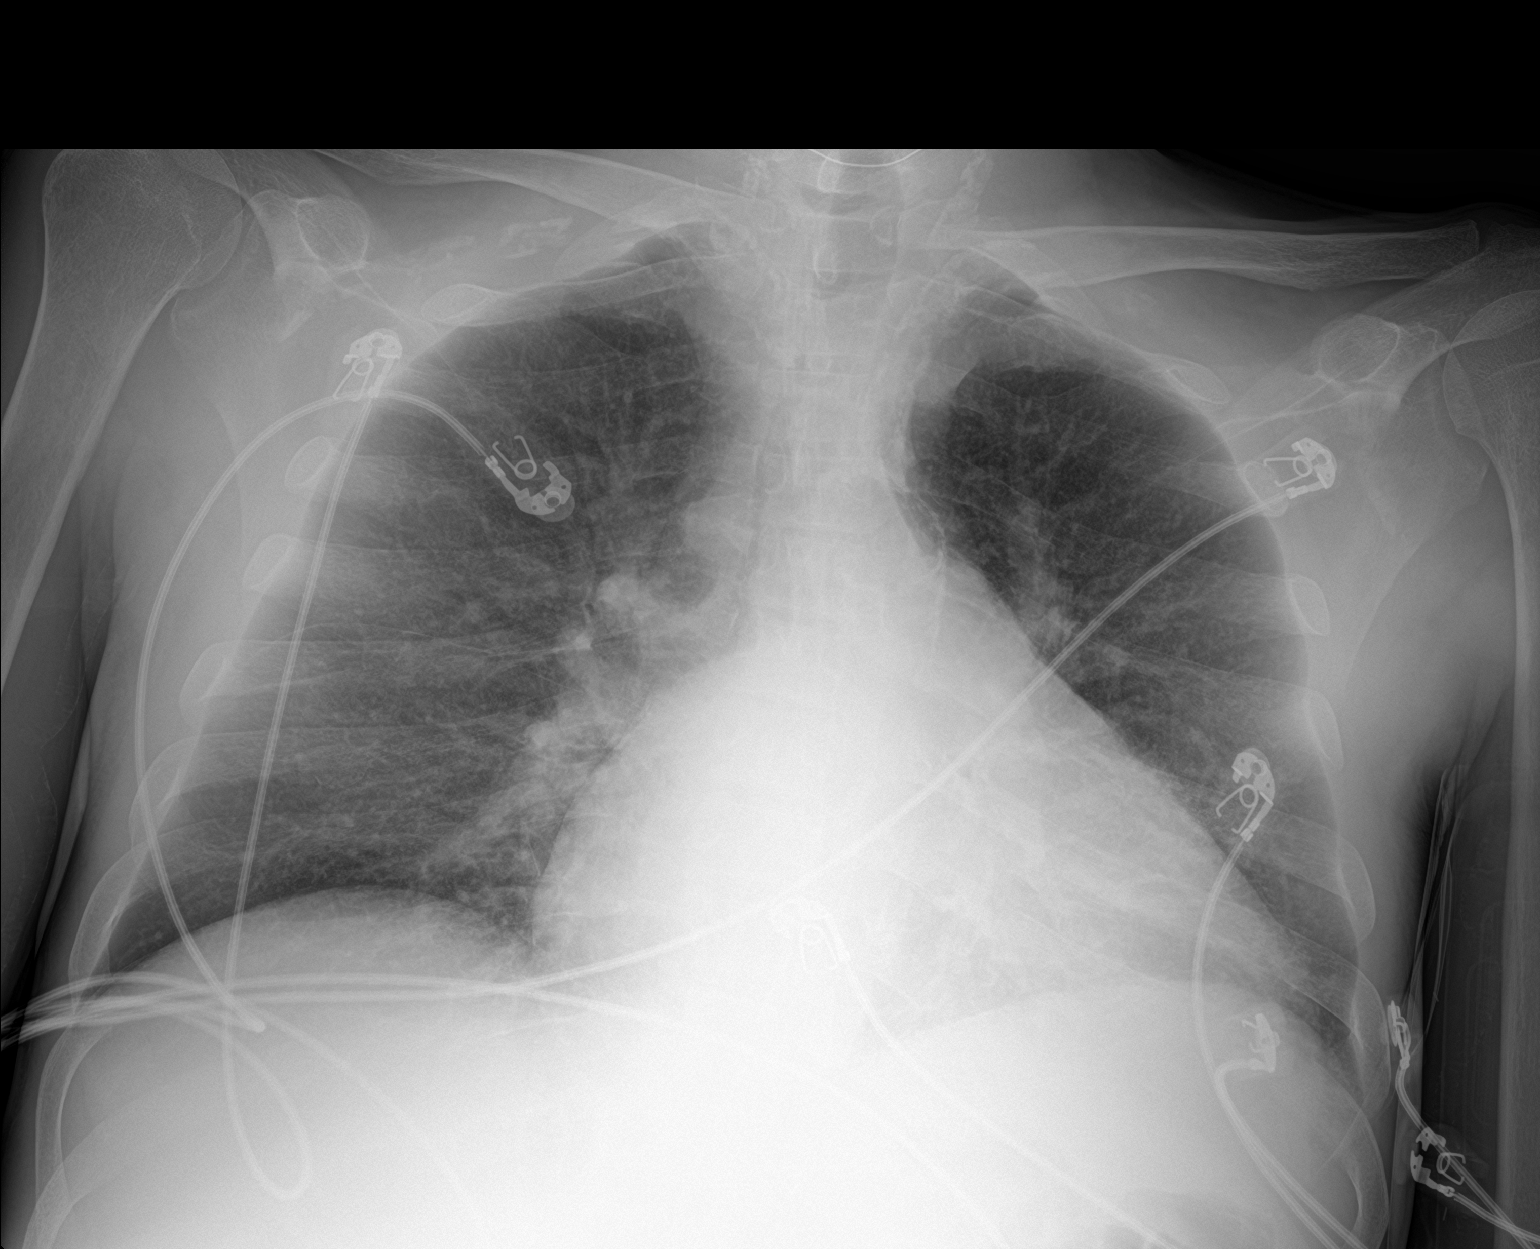

[2 of 2 positions shown; findings below may reference images not displayed]

FINDINGS: Cardiac silhouette markedly enlarged, unchanged. Thoracic aorta and
proximal great vessels severely atherosclerotic. Prominent central
pulmonary arteries, unchanged. Lungs clear. Mild pulmonary venous
hypertension without overt edema. No visible pleural effusions.
IMPRESSION: Stable marked cardiomegaly. No acute cardiopulmonary disease.

## 2021-08-18 IMAGING — DX DG CHEST 1V PORT
1 series · 1 of 1 positions shown · non-contrast
Comparison: 02/09/2020

CLINICAL DATA: Shortness of breath

EXAM:
PORTABLE CHEST 1 VIEW

[chest]
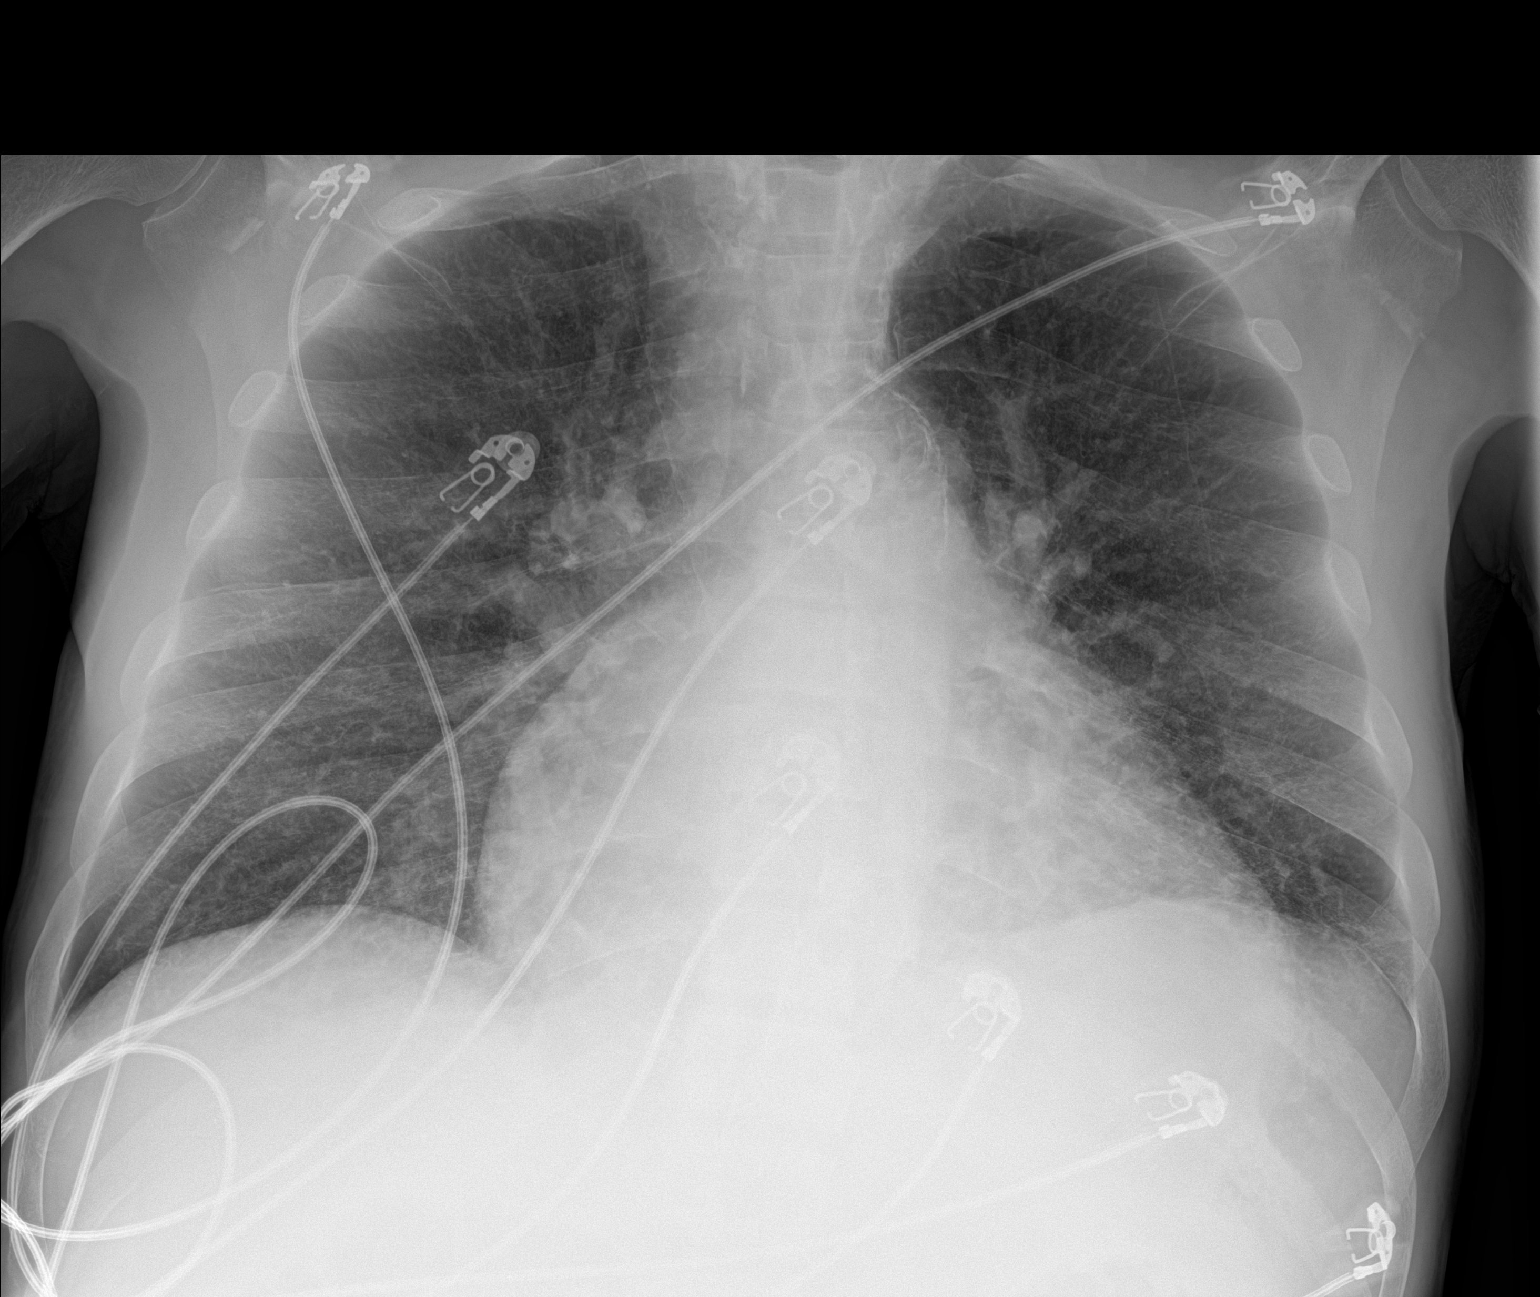

[1 of 1 positions shown; findings below may reference images not displayed]

FINDINGS: Stable cardiomegaly. Atherosclerotic calcification of the aorta and
arch vessels. Mild pulmonary vascular congestion with mildly
prominent interstitial markings bilaterally. No focal consolidation.
No pleural effusion or pneumothorax.
IMPRESSION: Findings suggestive of CHF with mild interstitial edema.

## 2021-08-22 IMAGING — US IR PARACENTESIS
1 series · 2 of 2 positions shown · non-contrast
Comparison: none

INDICATION: Patient with a history of cirrhosis and recurrent ascites.
Interventional radiology asked to perform a therapeutic
paracentesis.

[Series 1: ir (id) (id)/(id)/(id) ir · 2 of 2 slices shown]
[im 1/2]
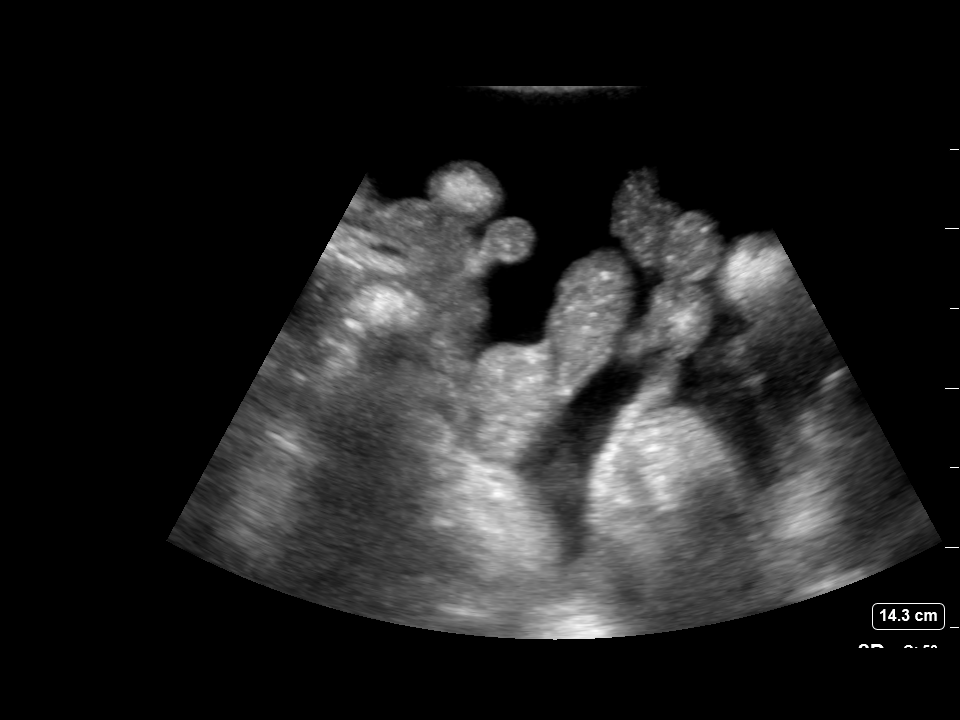
[im 2/2]
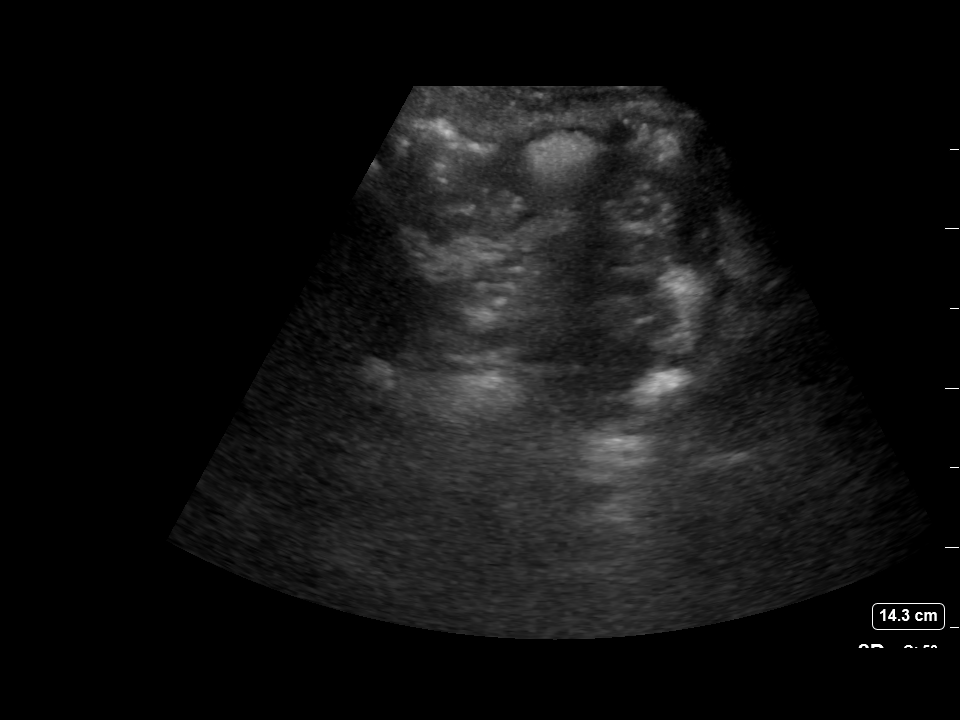

[2 of 2 positions shown; findings below may reference images not displayed]

EXAM:
ULTRASOUND GUIDED PARACENTESIS

MEDICATIONS:
1% lidocaine 20 mL

COMPLICATIONS:
None immediate

PROCEDURE:
Informed written consent was obtained from the patient after a
discussion of the risks, benefits and alternatives to treatment. A
timeout was performed prior to the initiation of the procedure.

Initial ultrasound scanning demonstrates a large amount of ascites
within the left lower abdominal quadrant. The left lower abdomen was
prepped and draped in the usual sterile fashion. 1% lidocaine was
used for local anesthesia.

Following this, a 6 Fr Safe-T-Centesis catheter was introduced. An
ultrasound image was saved for documentation purposes. The
paracentesis was performed. The catheter was removed and a dressing
was applied. The patient tolerated the procedure well without
immediate post procedural complication.
FINDINGS: A total of approximately 2.8 L of amber colored fluid was removed.
IMPRESSION: Successful ultrasound-guided paracentesis yielding 2.8 liters of
peritoneal fluid. Read by: Lai May Belonio, NP

## 2021-09-12 IMAGING — US IR ABDOMEN US LIMITED
1 series · 2 of 2 positions shown · non-contrast
Comparison: Multiple previous ultrasound-guided paracenteses, most
recently on 02/07/2020 yielding 780 cc of ascitic fluid.

CLINICAL DATA: History of cirrhosis with recurrent symptomatic
ascites. Please perform ascites search ultrasound ultrasound-guided
paracentesis for therapeutic purposes.

EXAM:
LIMITED ABDOMEN ULTRASOUND FOR ASCITES
TECHNIQUE: Limited ultrasound survey for ascites was performed in all four
abdominal quadrants.

[Series 1: ir (id) (id)/(id)/(id) ir · 2 of 2 slices shown]
[im 1/2]
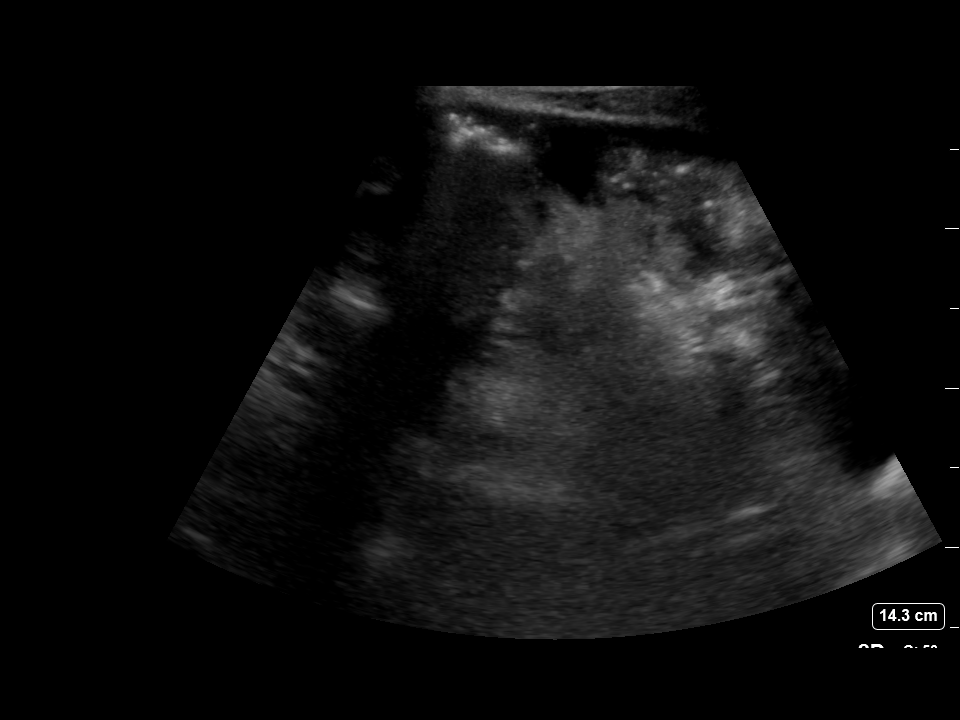
[im 2/2]
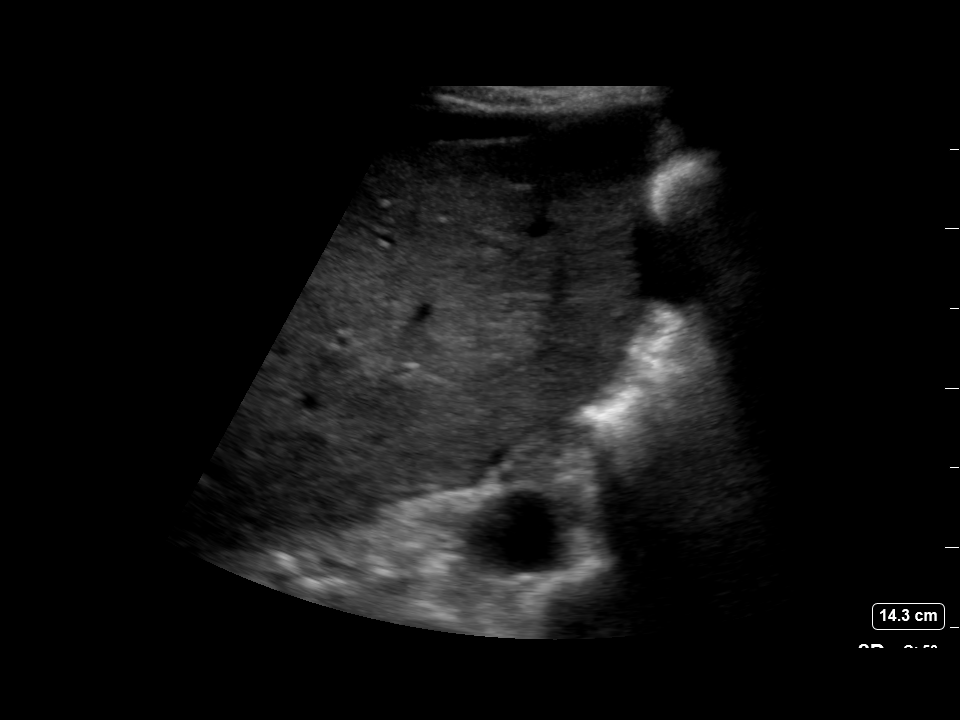

[2 of 2 positions shown; findings below may reference images not displayed]

FINDINGS: Sonographic evaluation demonstrates a trace amount of
intra-abdominal ascites, too small to allow for safe
ultrasound-guided paracentesis. No paracentesis attempted.
IMPRESSION: Trace amount of intra-abdominal ascites, too small to allow for safe
ultrasound-guided paracentesis. No paracentesis attempted.

## 2021-09-19 IMAGING — US IR ABDOMEN US LIMITED
1 series · 4 of 4 positions shown · non-contrast
Comparison: Ascites search ultrasound-03/13/2020; ultrasound-guided
paracentesis-02/21/2020 (yielding 2.8 L of peritoneal fluid).

CLINICAL DATA: History of cirrhosis with recurrent symptomatic
ascites. Patient presents today for ascites search ultrasound and
ultrasound-guided paracentesis for therapeutic purposes.

EXAM:
LIMITED ABDOMEN ULTRASOUND FOR ASCITES
TECHNIQUE: Limited ultrasound survey for ascites was performed in all four
abdominal quadrants.

[Series 1: ir (id) (id)/(id)/(id) ir · 4 of 4 slices shown]
[im 1/4]
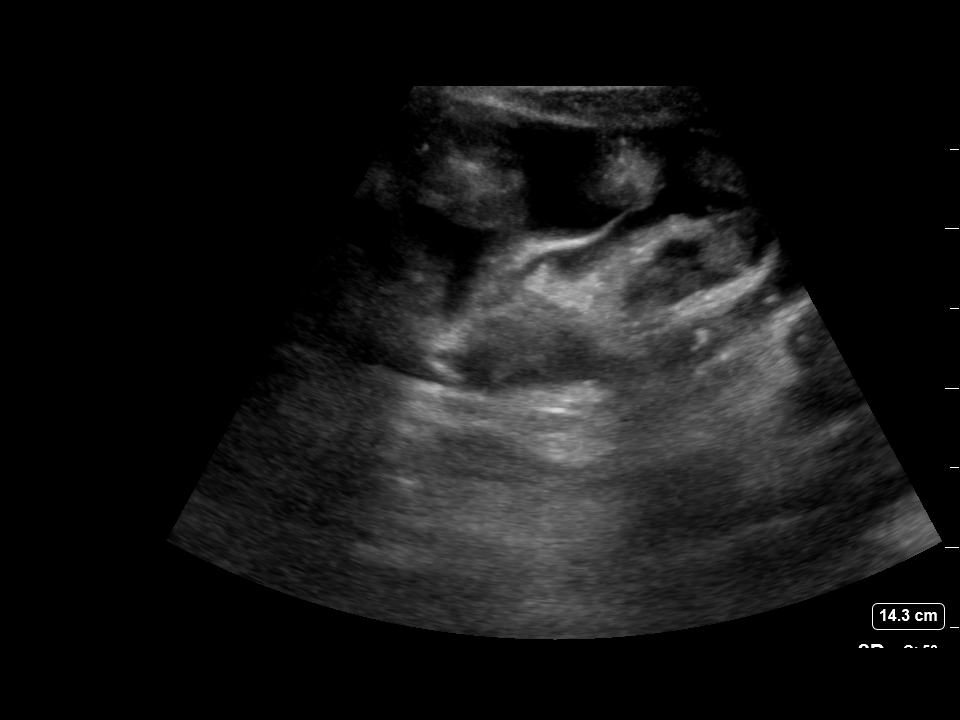
[im 2/4]
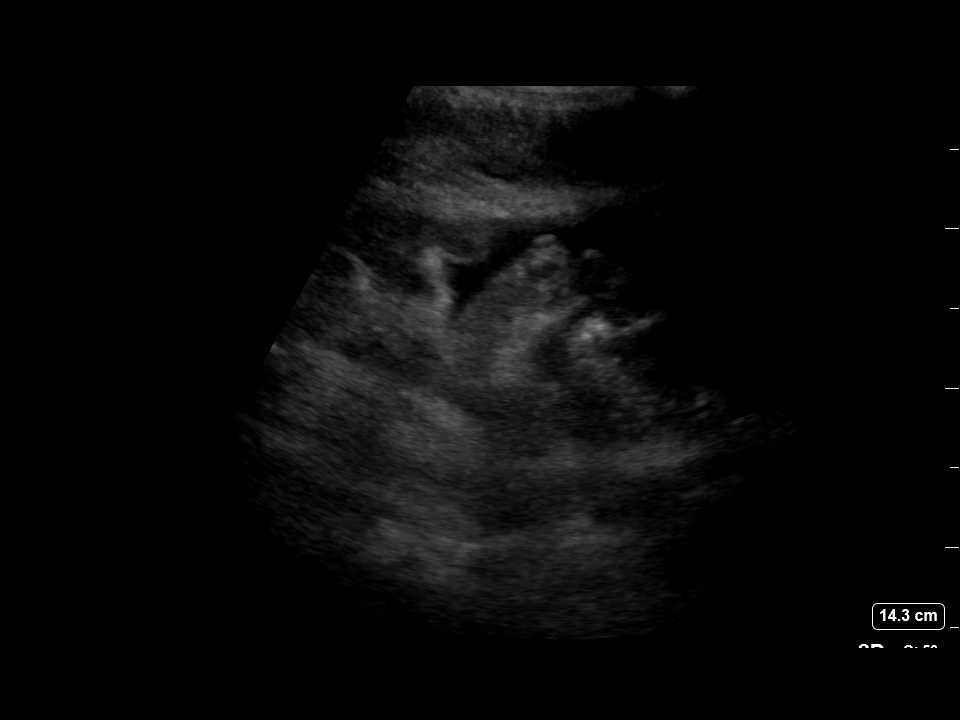
[im 3/4]
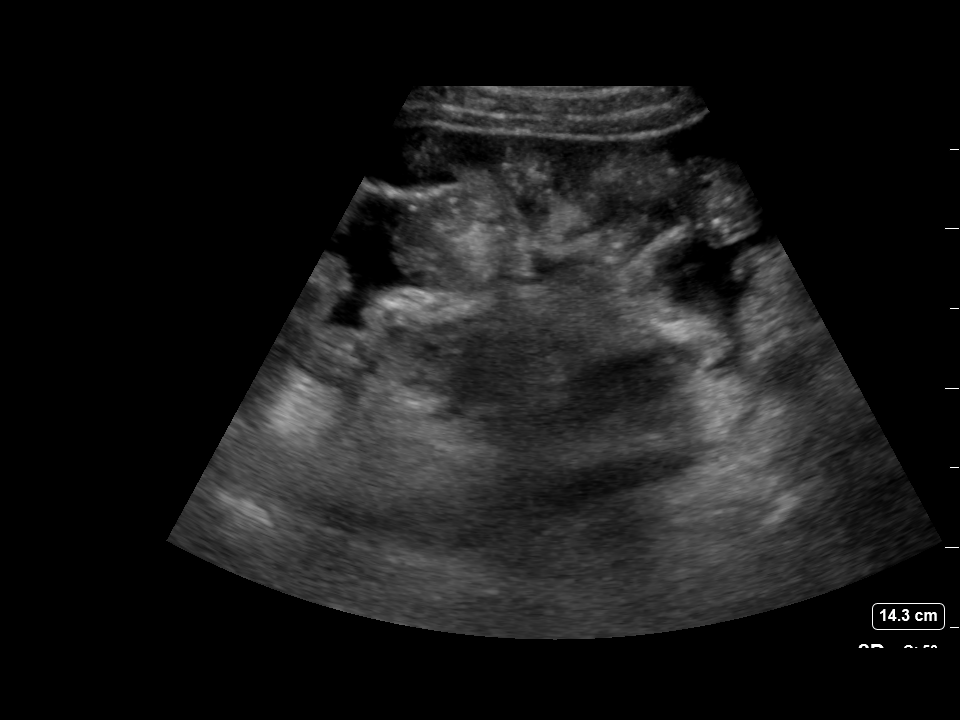
[im 4/4]
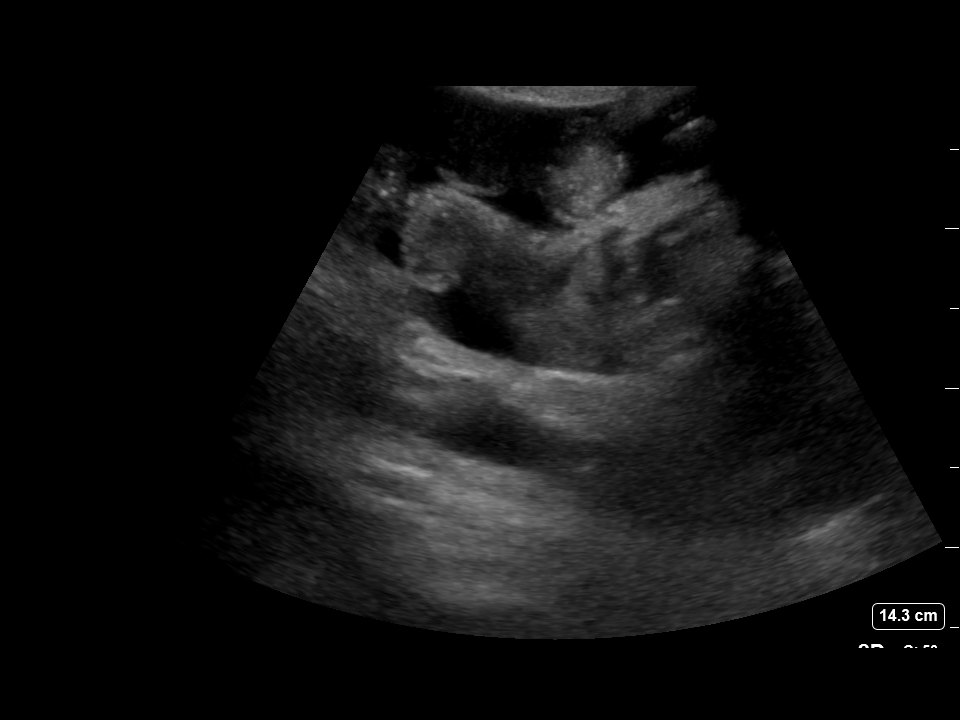

[4 of 4 positions shown; findings below may reference images not displayed]

FINDINGS: Sonographic evaluation of the abdomen demonstrates a trace amount of
intra-abdominal ascites, too small to allow for safe
ultrasound-guided paracentesis. No paracentesis attempted.
IMPRESSION: Trace amount of intra-abdominal ascites, too small to allow for safe
ultrasound-guided paracentesis. No paracentesis attempted.

## 2021-12-10 IMAGING — US IR ABDOMEN US LIMITED
1 series · 5 of 5 positions shown · non-contrast
Comparison: 05/21/2020

CLINICAL DATA: History of cirrhosis and ascites with prior
paracentesis procedures, most recently on 05/21/2020.

EXAM:
LIMITED ABDOMEN ULTRASOUND FOR ASCITES
TECHNIQUE: Limited ultrasound survey for ascites was performed in all four
abdominal quadrants.

[Series 1: ir (id) (id)/(id)/(id) ir · 5 of 5 slices shown]
[im 1/5]
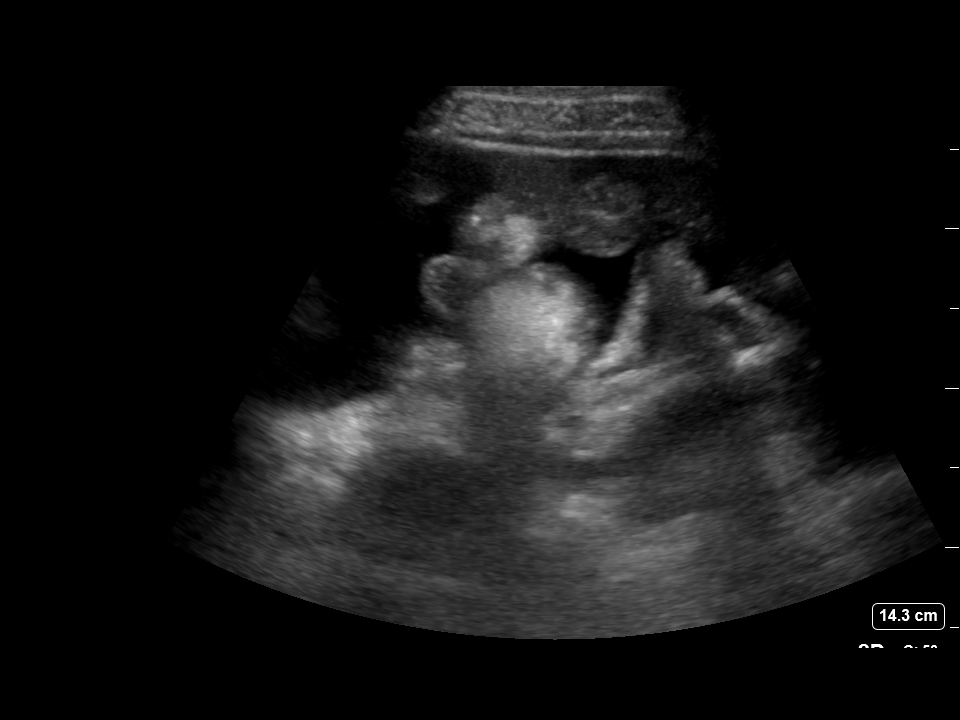
[im 2/5]
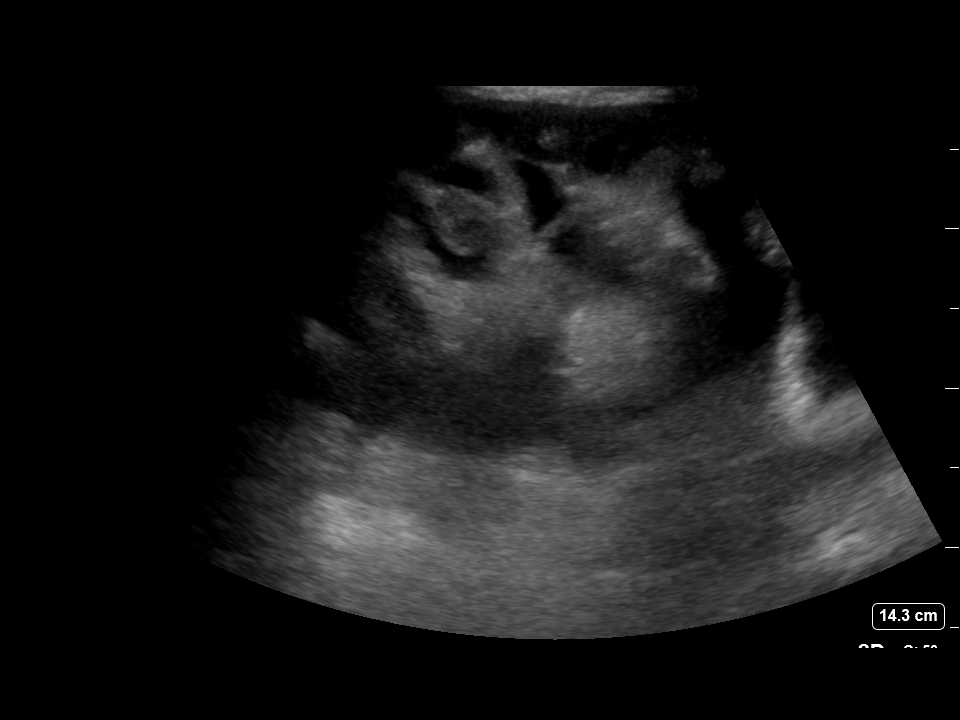
[im 3/5]
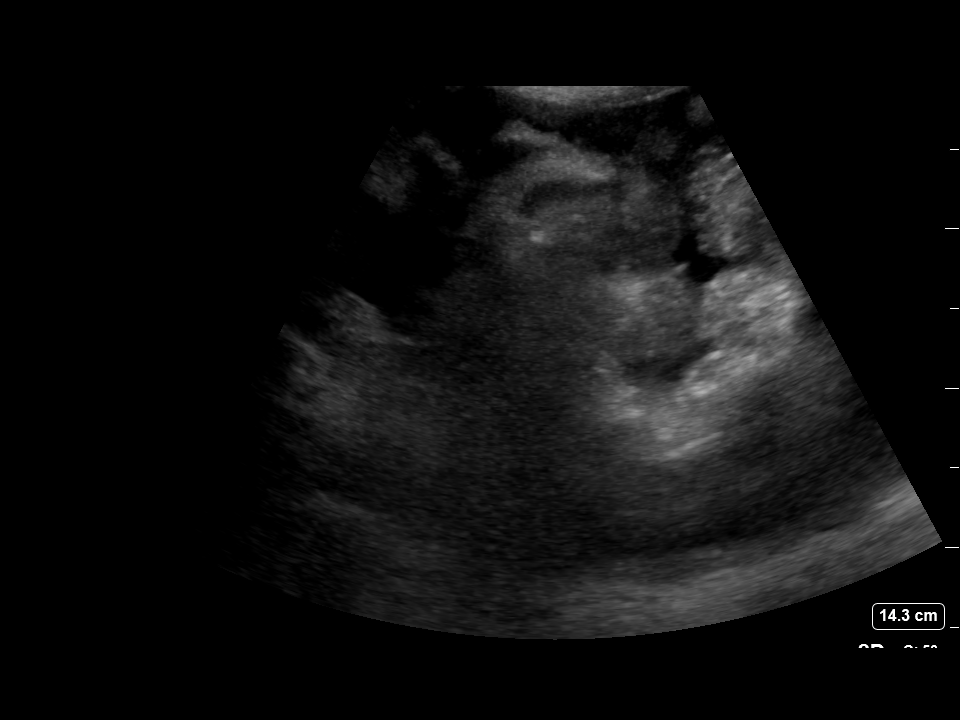
[im 4/5]
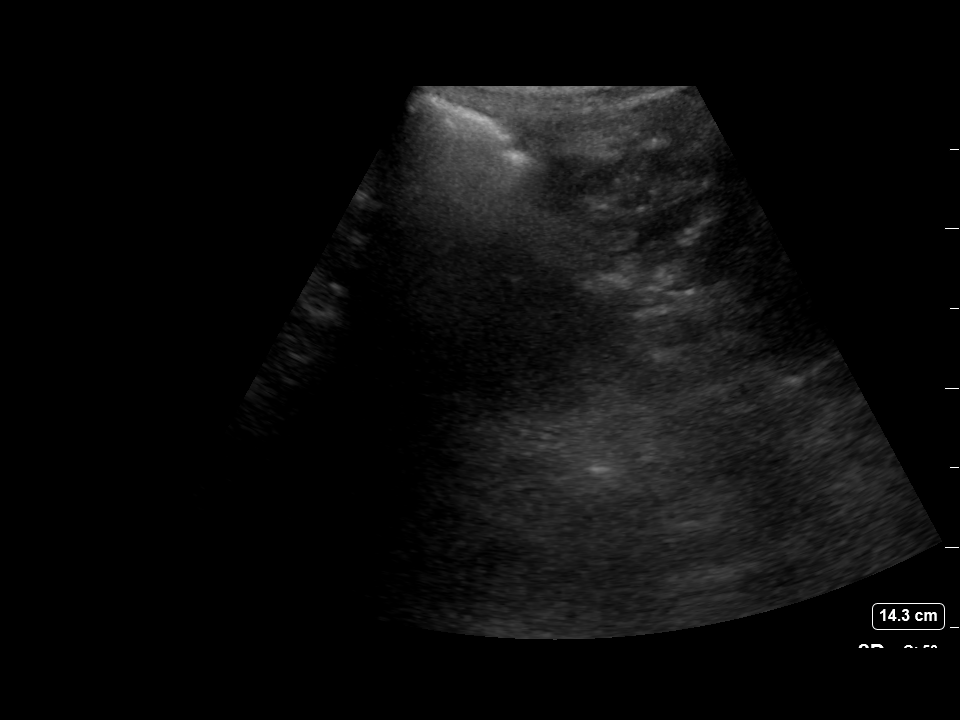
[im 5/5]
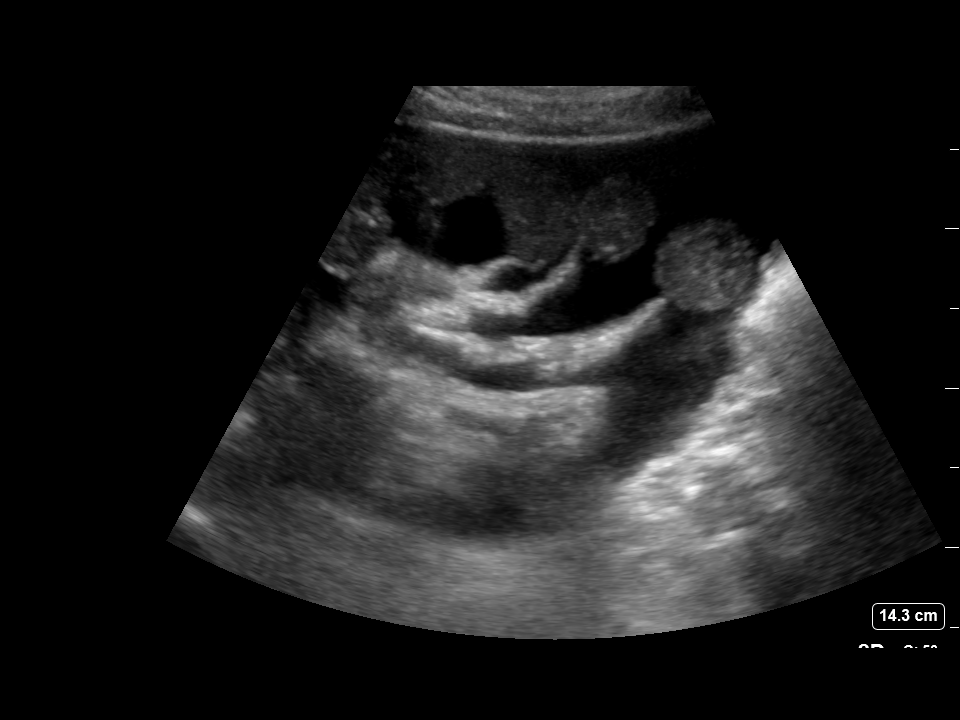

[5 of 5 positions shown; findings below may reference images not displayed]

FINDINGS: Survey of the peritoneal cavity shows scattered ascites with
relatively small volume throughout. A large enough pocket was not
present to allow for safe paracentesis today.
IMPRESSION: Small volume of ascites scattered throughout the peritoneal cavity.
Volume was not large enough to warrant therapeutic paracentesis
today.

## 2021-12-16 IMAGING — DX DG CHEST 2V
2 series · 2 of 2 positions shown · non-contrast
Comparison: None.

CLINICAL DATA: Shortness of breath

EXAM:
CHEST - 2 VIEW

[chest lat]
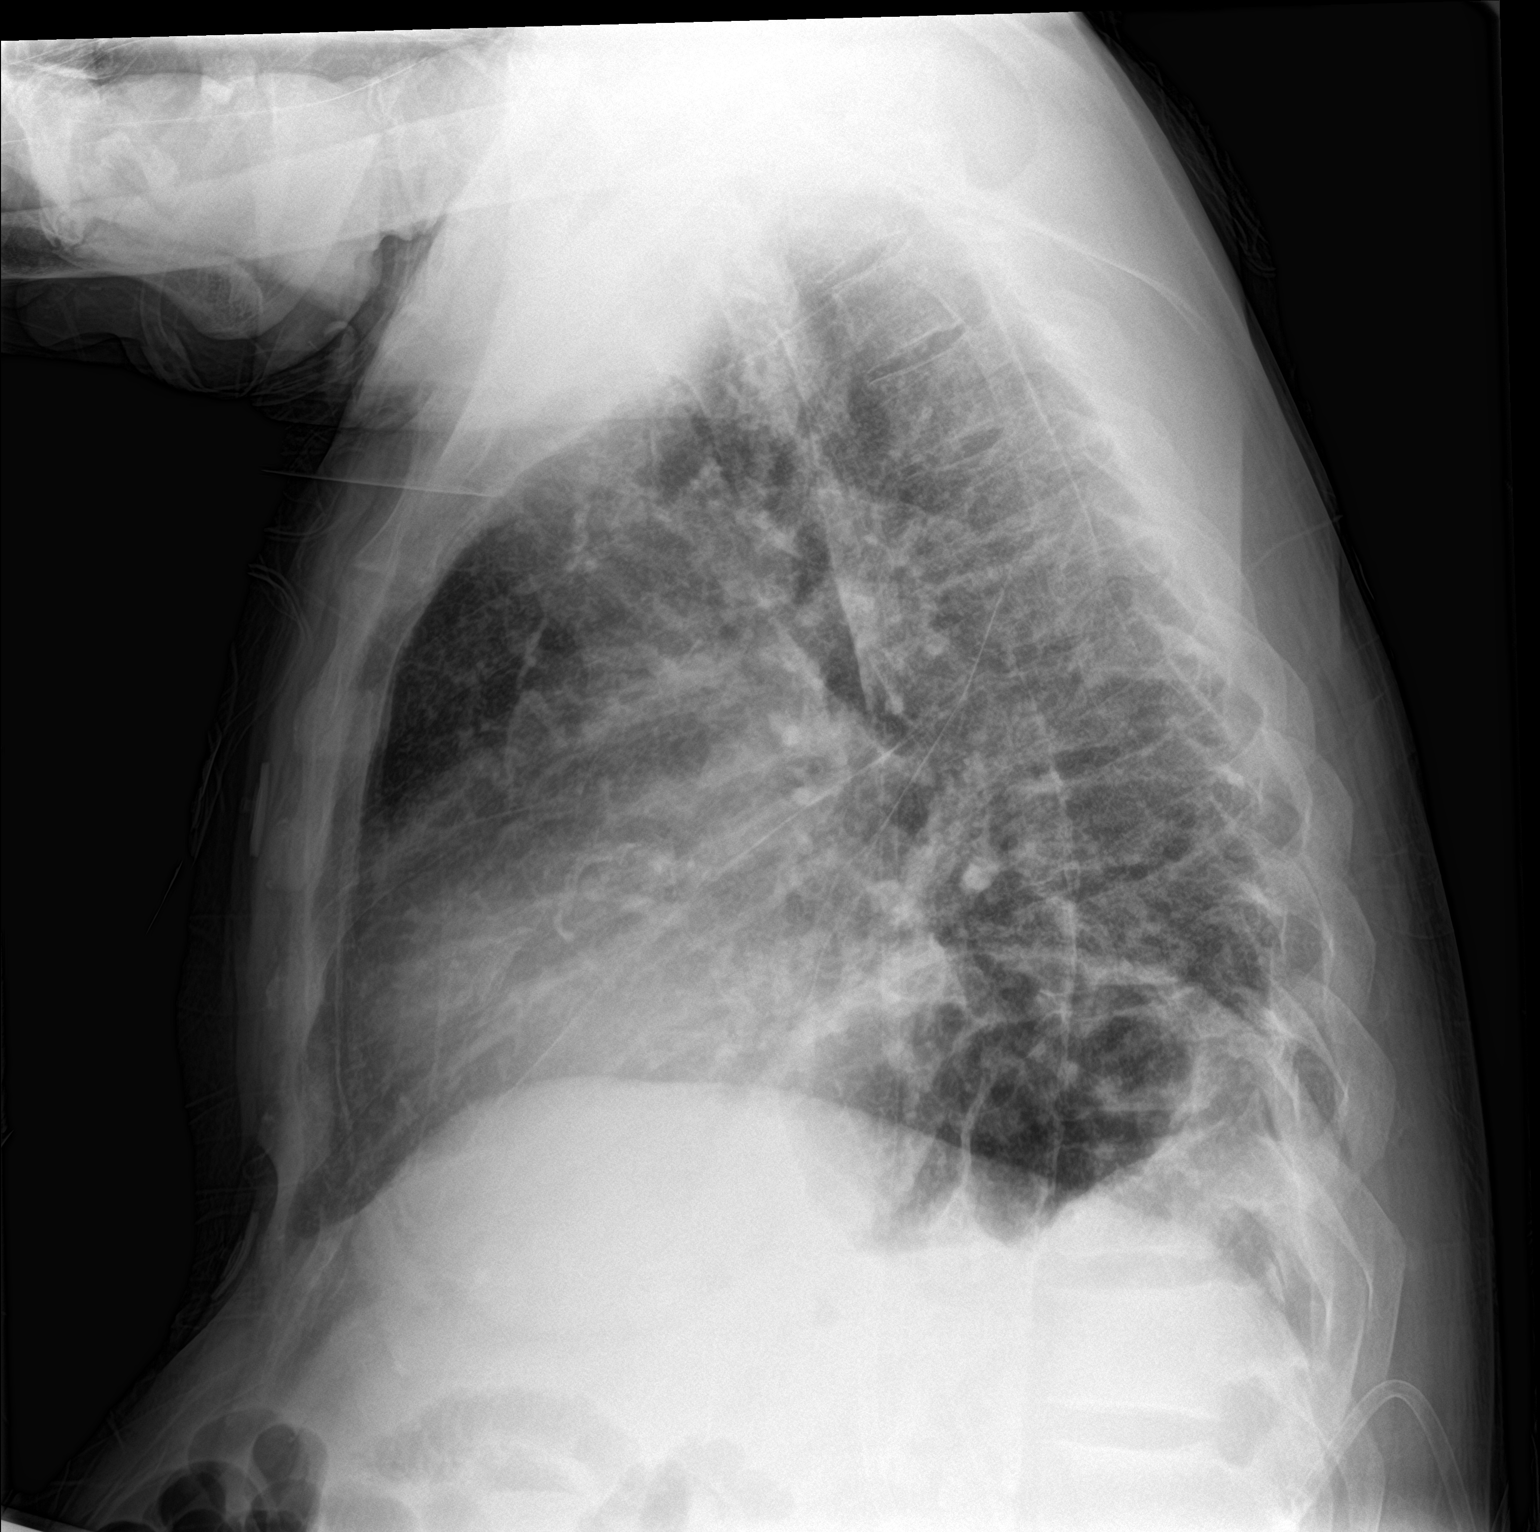

[chest ap]
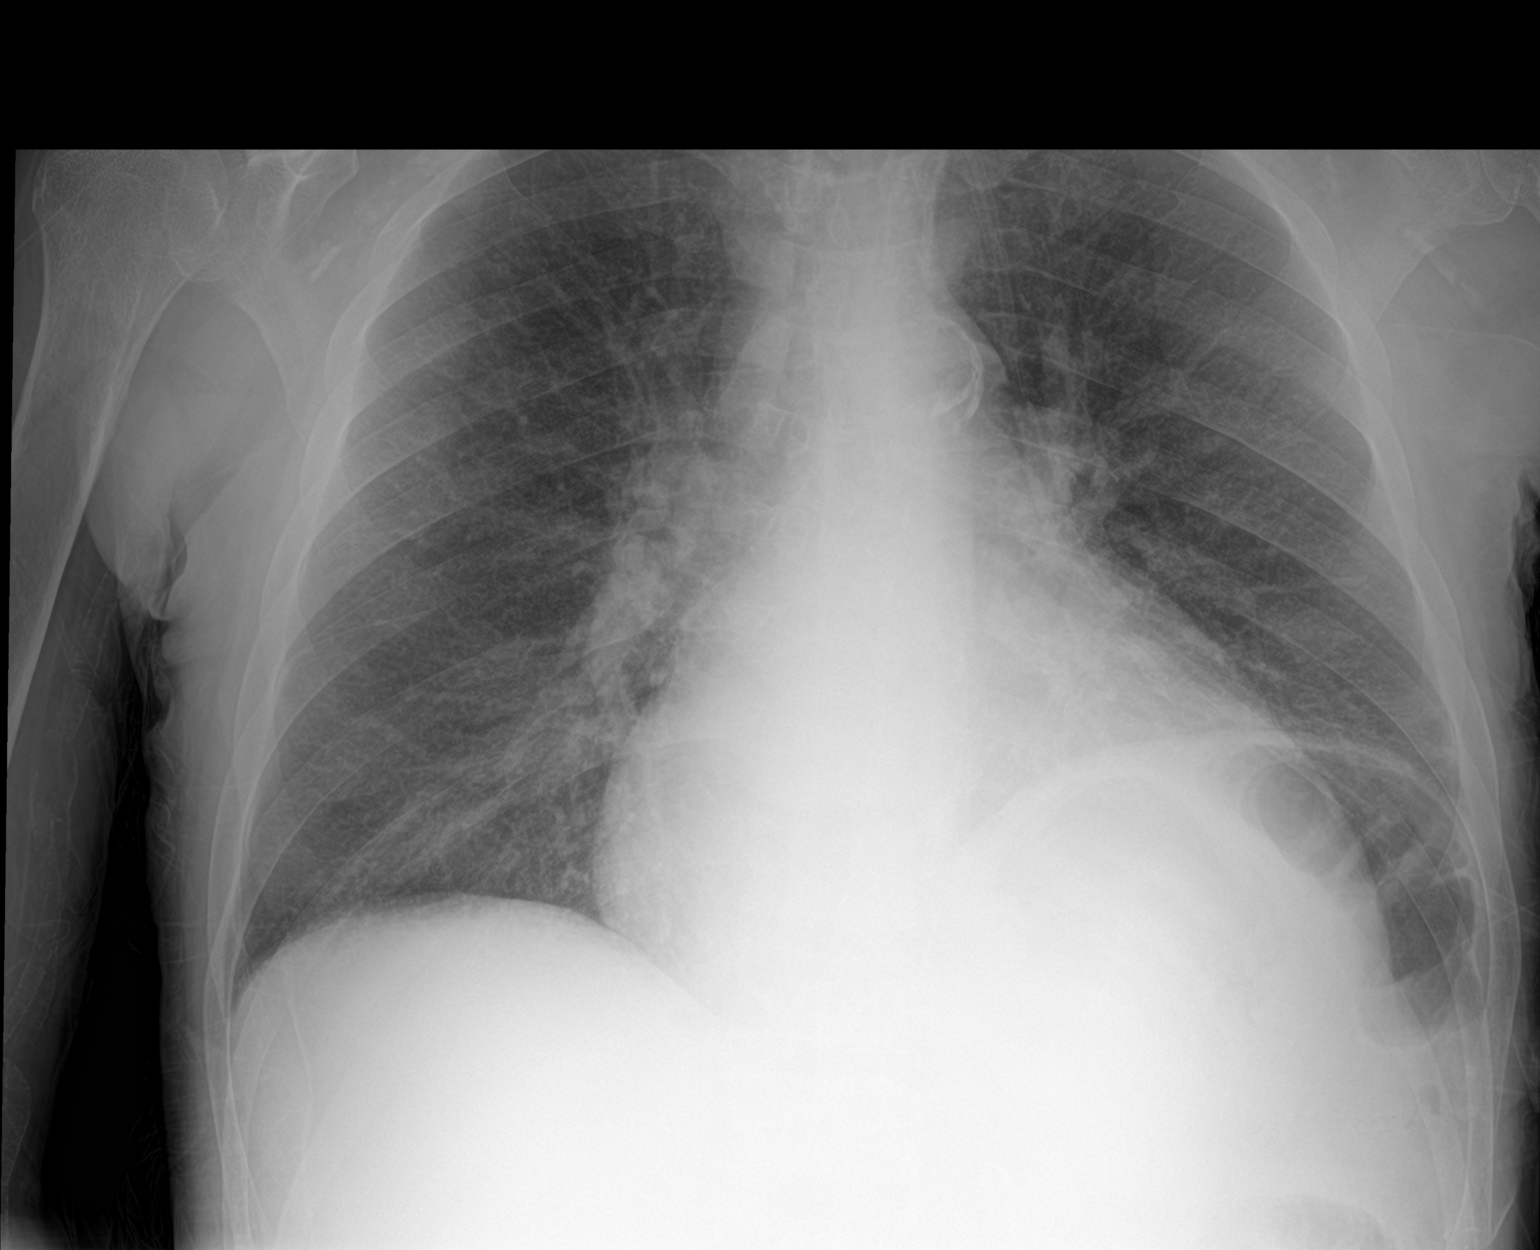

[2 of 2 positions shown; findings below may reference images not displayed]

FINDINGS: The heart size and mediastinal contours are mildly enlarged as on
prior exam. There is prominence of the central pulmonary
vasculature. aortic knob calcifications are seen. The visualized
skeletal structures are unremarkable.
IMPRESSION: Mild cardiomegaly and pulmonary vascular congestion.

## 2022-04-11 IMAGING — US IR PARACENTESIS
1 series · 3 of 3 positions shown · non-contrast
Comparison: none

INDICATION: Patient with history of end-stage renal disease, cirrhosis,
hepatitis B and C with recurrent ascites. Request is for therapeutic
paracentesis.

[Series 1: ir (id) (id)/(id)/(id) ir · 3 of 3 slices shown]
[im 1/3]
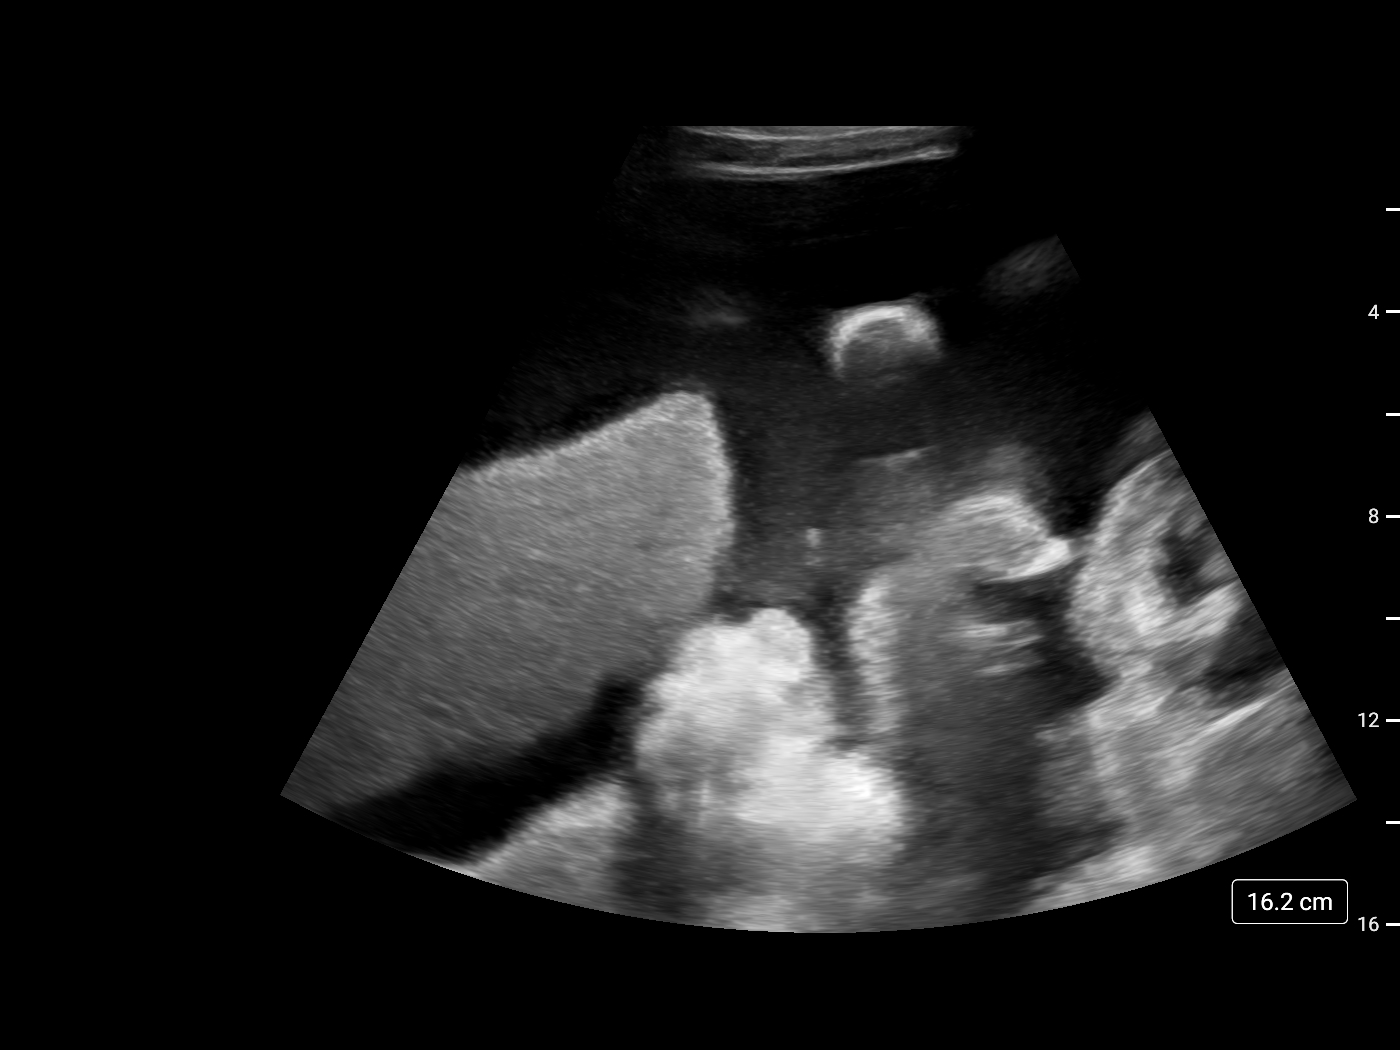
[im 2/3]
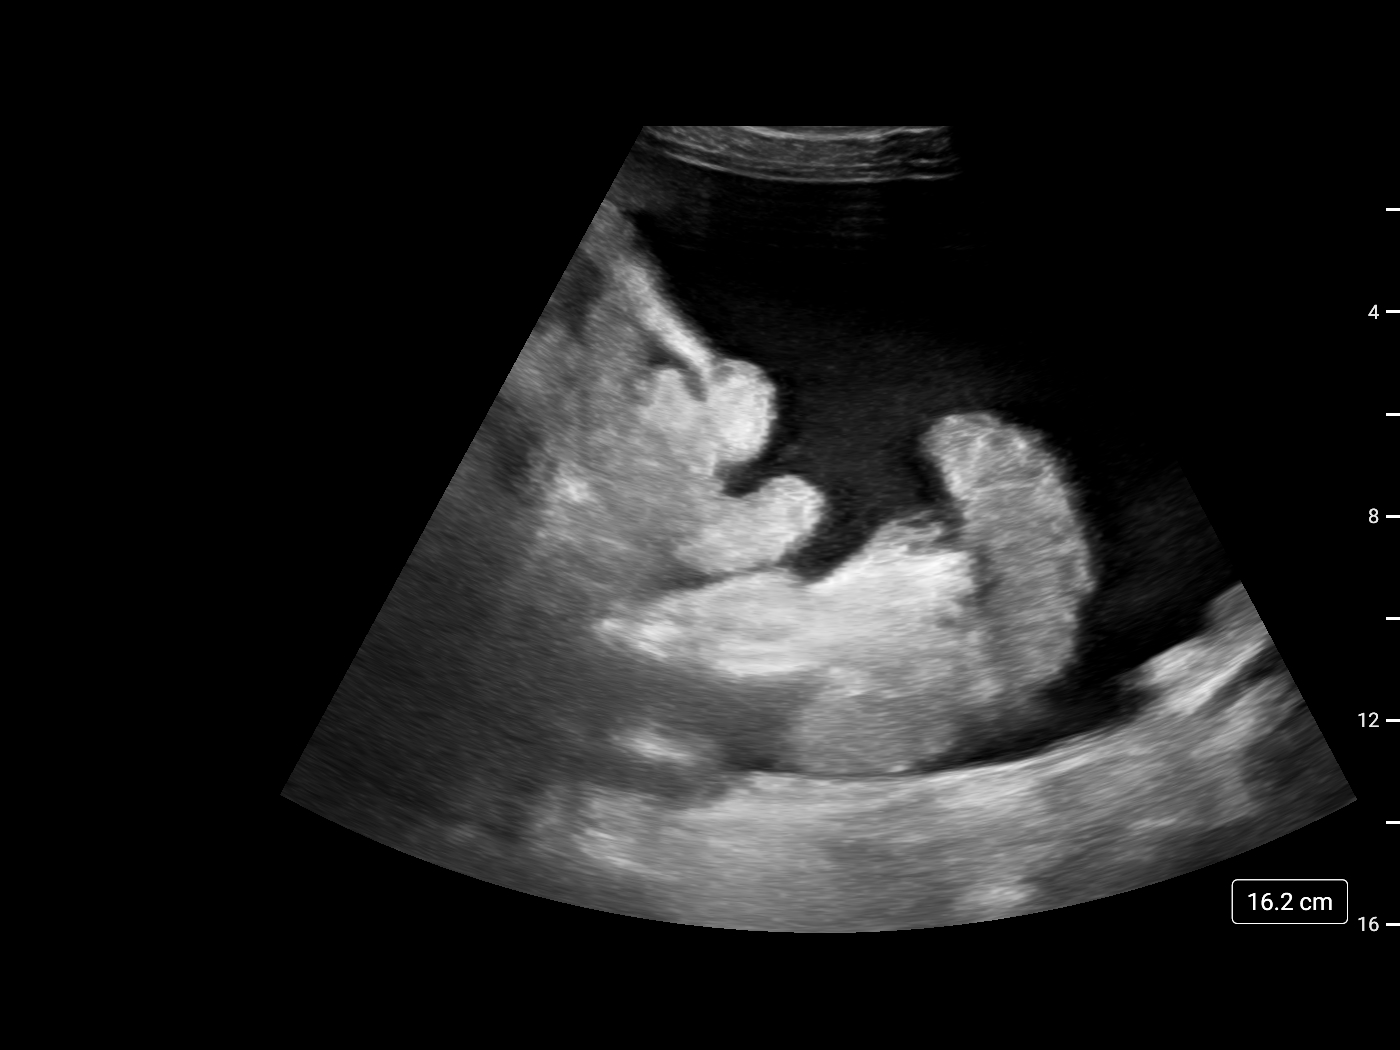
[im 3/3]
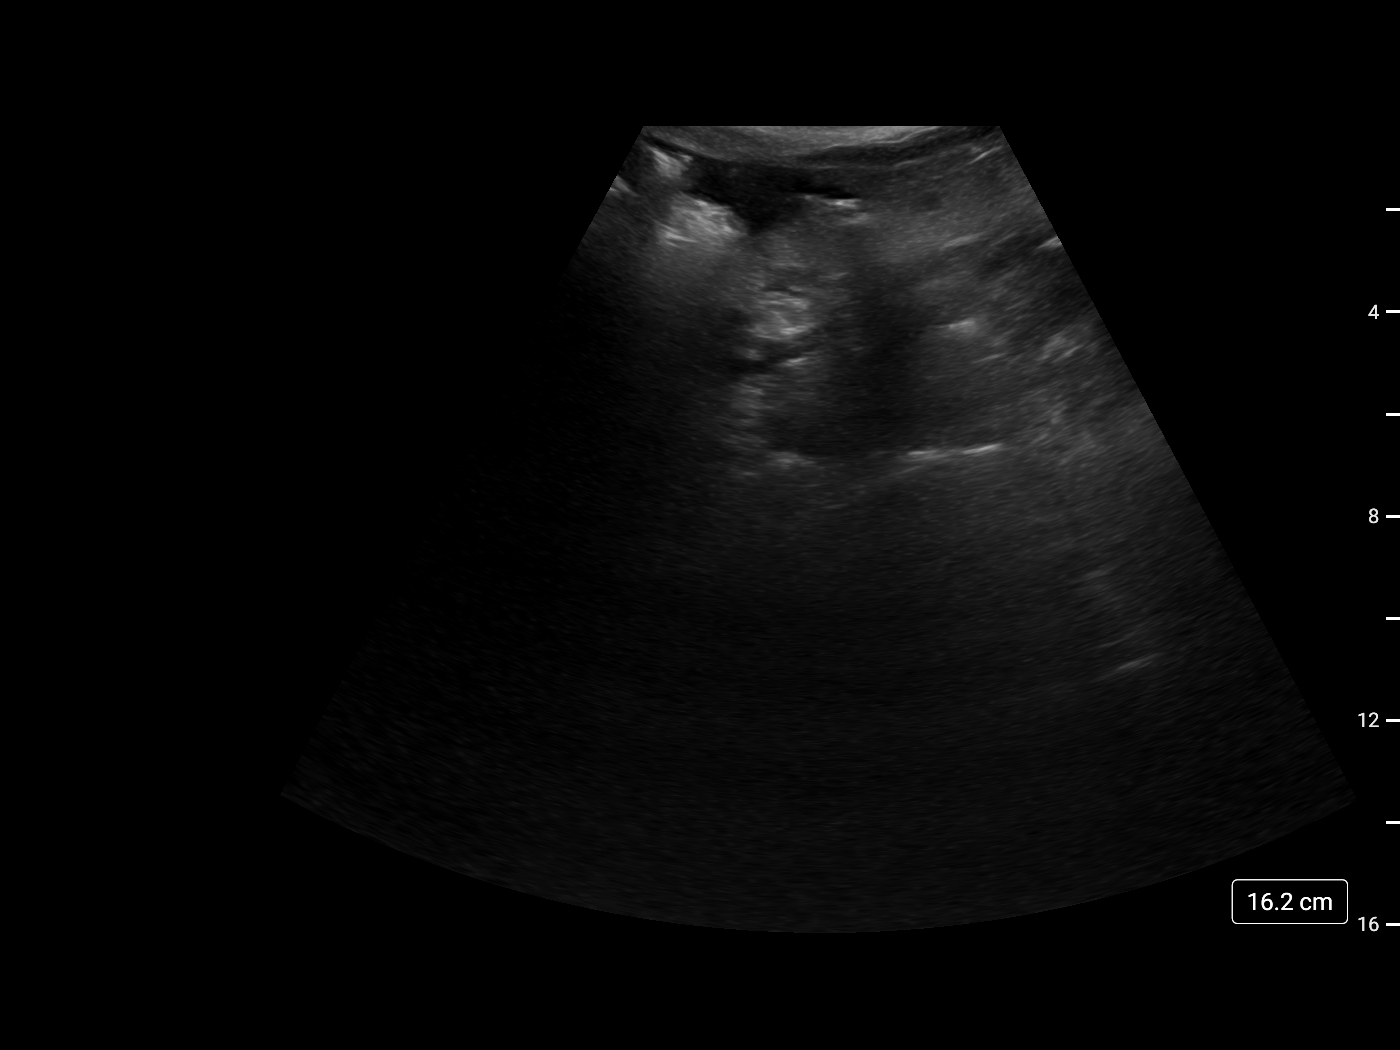

[3 of 3 positions shown; findings below may reference images not displayed]

EXAM:
ULTRASOUND GUIDED THERAPEUTIC PARACENTESIS

MEDICATIONS:
Lidocaine 1% 10 mL

COMPLICATIONS:
None immediate.

PROCEDURE:
Informed written consent was obtained from the patient after a
discussion of the risks, benefits and alternatives to treatment. A
timeout was performed prior to the initiation of the procedure.

Initial ultrasound scanning demonstrates a moderate amount of
ascites within the left lower abdominal quadrant. The left lower
abdomen was prepped and draped in the usual sterile fashion. 1%
lidocaine was used for local anesthesia.

Following this, a 19 gauge, 7-cm, Yueh catheter was introduced. An
ultrasound image was saved for documentation purposes. The
paracentesis was performed. The catheter was removed and a dressing
was applied. The patient tolerated the procedure well without
immediate post procedural complication.
Patient received post-procedure intravenous albumin; see nursing
notes for details.
FINDINGS: A total of approximately 4.2 L of amber colored fluid was removed.
IMPRESSION: Successful ultrasound-guided therapeutic paracentesis yielding
liters of peritoneal fluid.

Read by: Ppontien Stenbok, NP

## 2022-04-29 IMAGING — US IR PARACENTESIS
1 series · 3 of 3 positions shown · non-contrast
Comparison: none

INDICATION: End-stage renal disease. Recurrent ascites. Request for therapeutic
paracentesis.

[Series 1: ir (id) (id)/(id)/(id) ir · 3 of 3 slices shown]
[im 1/3]
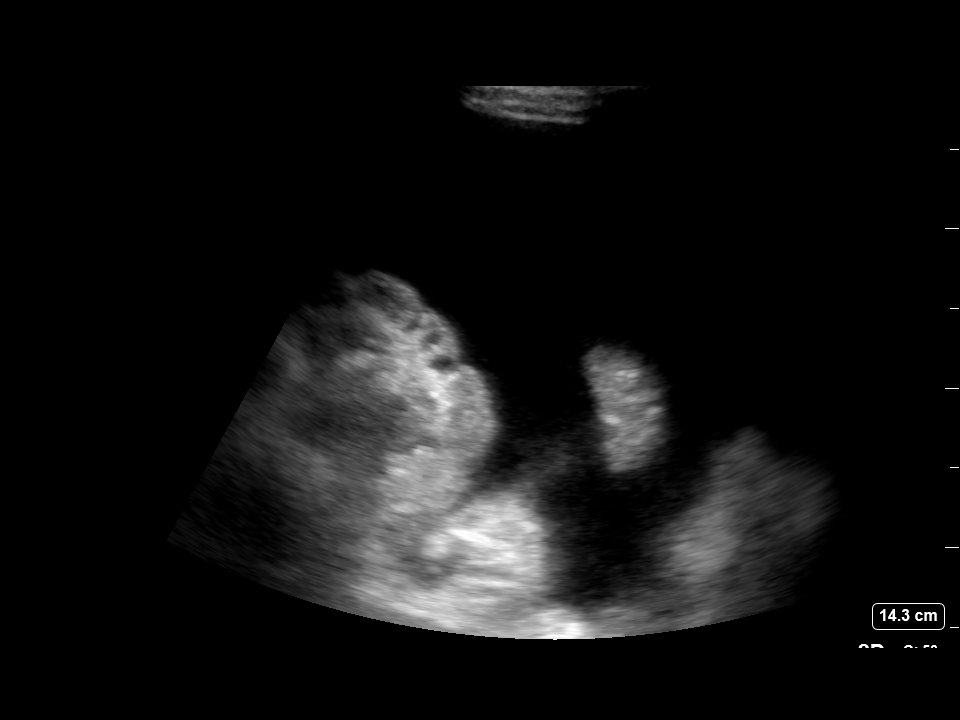
[im 2/3]
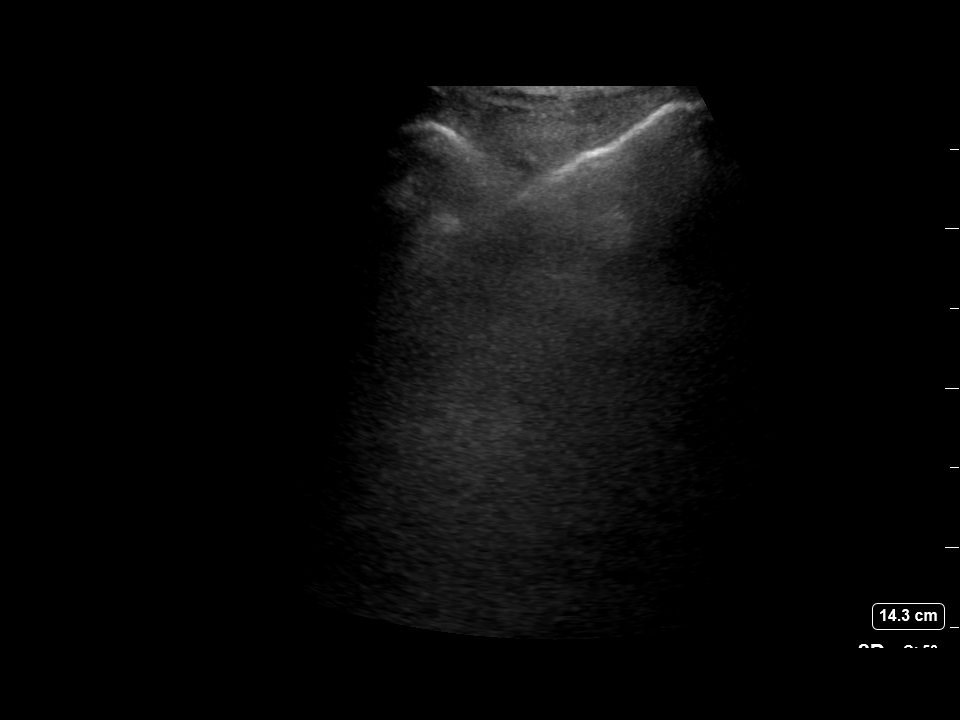
[im 3/3]
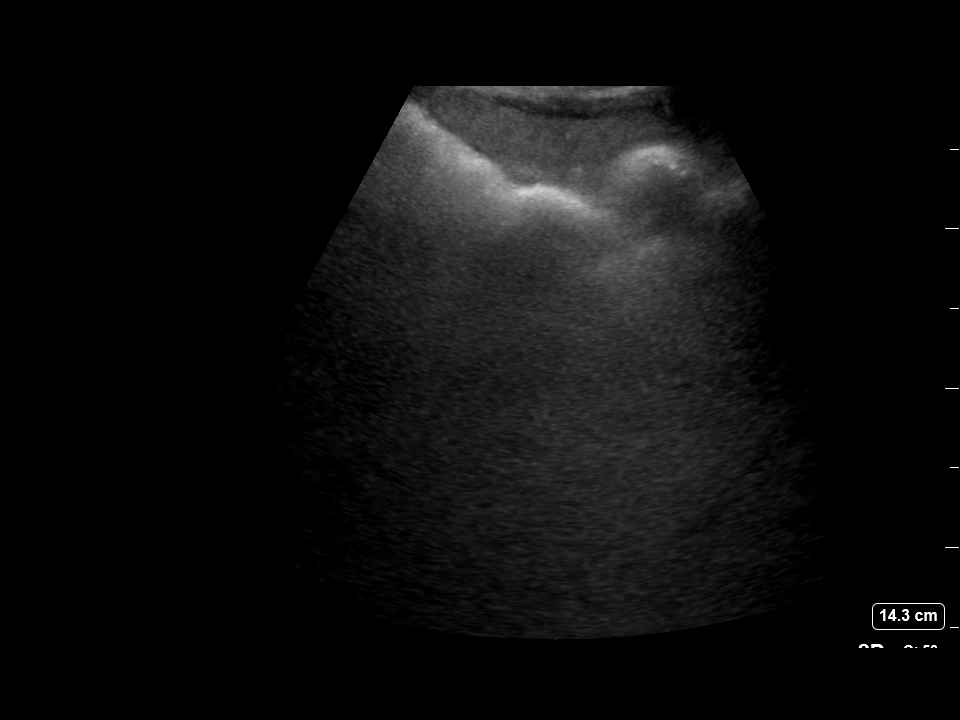

[3 of 3 positions shown; findings below may reference images not displayed]

EXAM:
ULTRASOUND GUIDED LEFT LOWER QUADRANT PARACENTESIS

MEDICATIONS:
None.

COMPLICATIONS:
None immediate.

PROCEDURE:
Informed written consent was obtained from the patient after a
discussion of the risks, benefits and alternatives to treatment. A
timeout was performed prior to the initiation of the procedure.

Initial ultrasound scanning demonstrates a large amount of ascites
within the left lower abdominal quadrant. The left lower abdomen was
prepped and draped in the usual sterile fashion. 1% lidocaine was
used for local anesthesia.

Following this, a 19 gauge, 7-cm, Yueh catheter was introduced. An
ultrasound image was saved for documentation purposes. The
paracentesis was performed. The catheter was removed and a dressing
was applied. The patient tolerated the procedure well without
immediate post procedural complication.
FINDINGS: A total of approximately 4 L of hazy, serosanguineous fluid was
removed.
IMPRESSION: Successful ultrasound-guided paracentesis yielding 4 liters of
peritoneal fluid.

## 2022-05-20 IMAGING — US IR PARACENTESIS
1 series · 3 of 3 positions shown · non-contrast
Comparison: none

INDICATION: Patient with a history of end-stage renal disease and recurrent
ascites. Interventional radiology asked to perform a therapeutic
paracentesis.

[Series 1: ir (id) (id)/(id)/(id) ir · 3 of 3 slices shown]
[im 1/3]
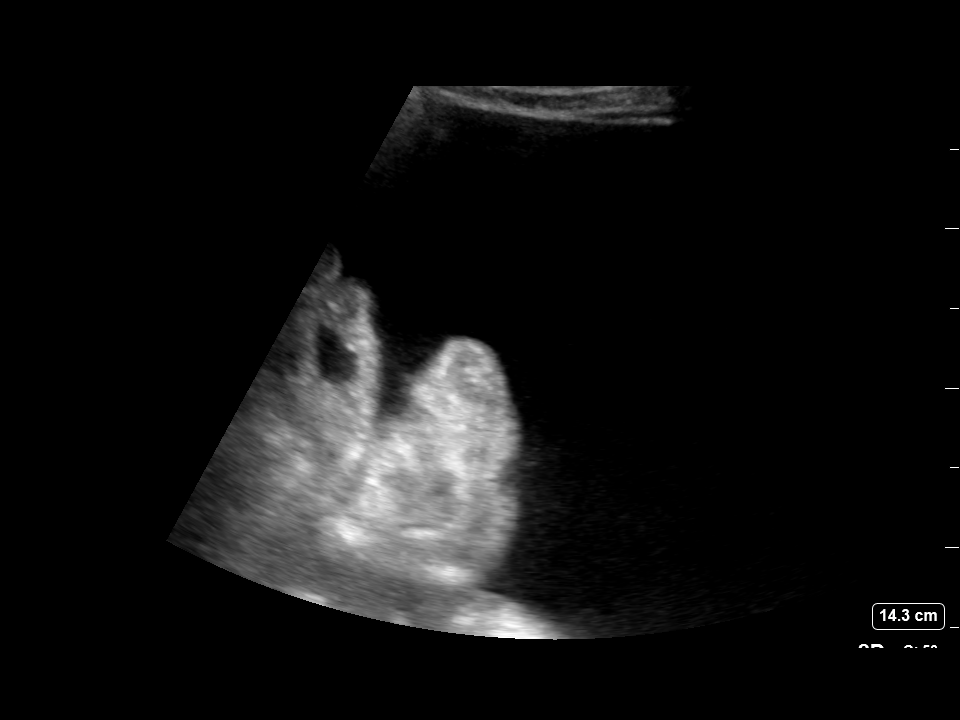
[im 2/3]
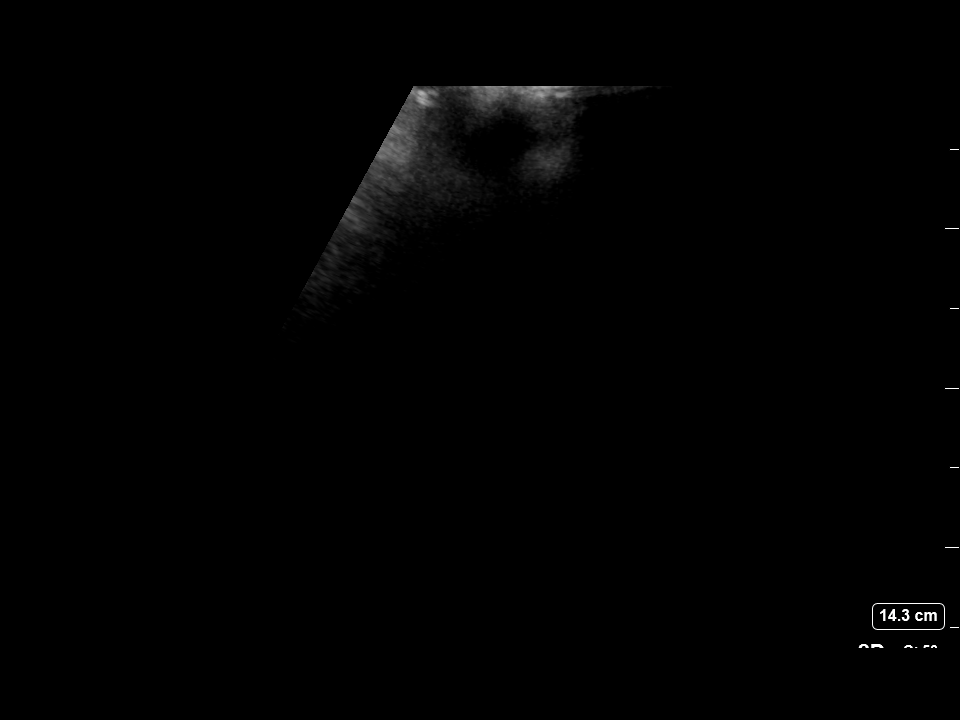
[im 3/3]
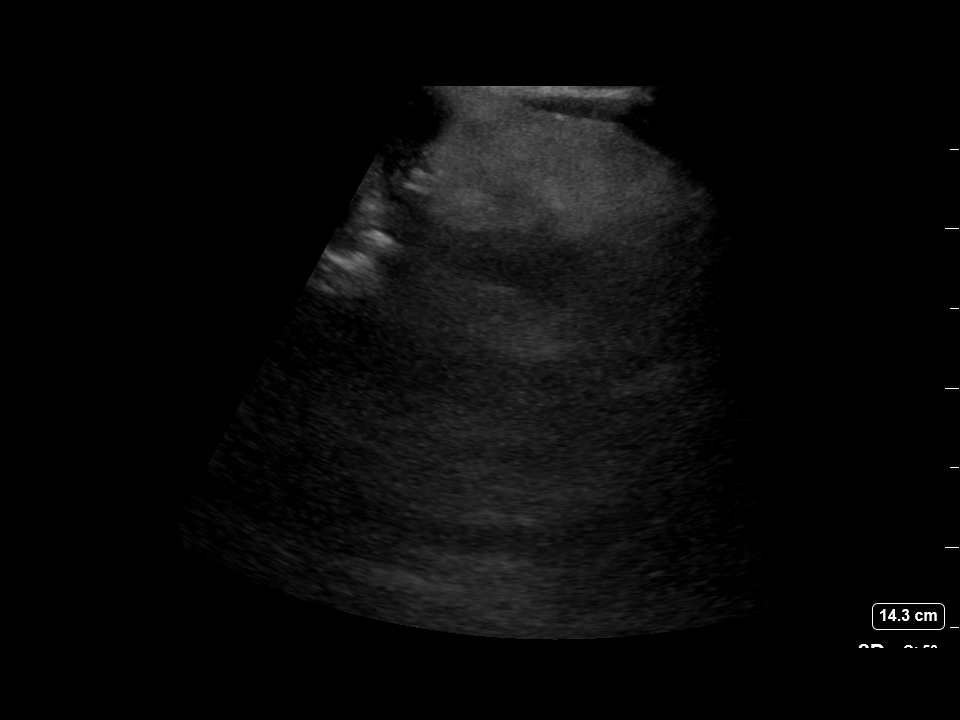

[3 of 3 positions shown; findings below may reference images not displayed]

EXAM:
ULTRASOUND GUIDED PARACENTESIS

MEDICATIONS:
1% lidocaine 10 mL

COMPLICATIONS:
None immediate.

PROCEDURE:
Informed written consent was obtained from the patient after a
discussion of the risks, benefits and alternatives to treatment. A
timeout was performed prior to the initiation of the procedure.

Initial ultrasound scanning demonstrates a large amount of ascites
within the left lower abdominal quadrant. The left lower abdomen was
prepped and draped in the usual sterile fashion. 1% lidocaine was
used for local anesthesia.

Following this, a 19 gauge, 10-cm, Yueh catheter was introduced. An
ultrasound image was saved for documentation purposes. The
paracentesis was performed. The catheter was removed and a dressing
was applied. The patient tolerated the procedure well without
immediate post procedural complication.
FINDINGS: A total of approximately 4.2 L of light brown fluid was removed.
IMPRESSION: Successful ultrasound-guided paracentesis yielding 4.2 liters of
peritoneal fluid. Read by: Kurnosa Abramidze, NP

## 2022-06-03 IMAGING — US IR PARACENTESIS
1 series · 2 of 2 positions shown · non-contrast
Comparison: none

INDICATION: Patient with history of end-stage renal disease, cirrhosis,
hepatitis B/C, recurrent ascites. Request received for therapeutic
paracentesis up to 4 liters.

[Series 1: ir (id) (id)/(id)/(id) ir · 2 of 2 slices shown]
[im 1/2]
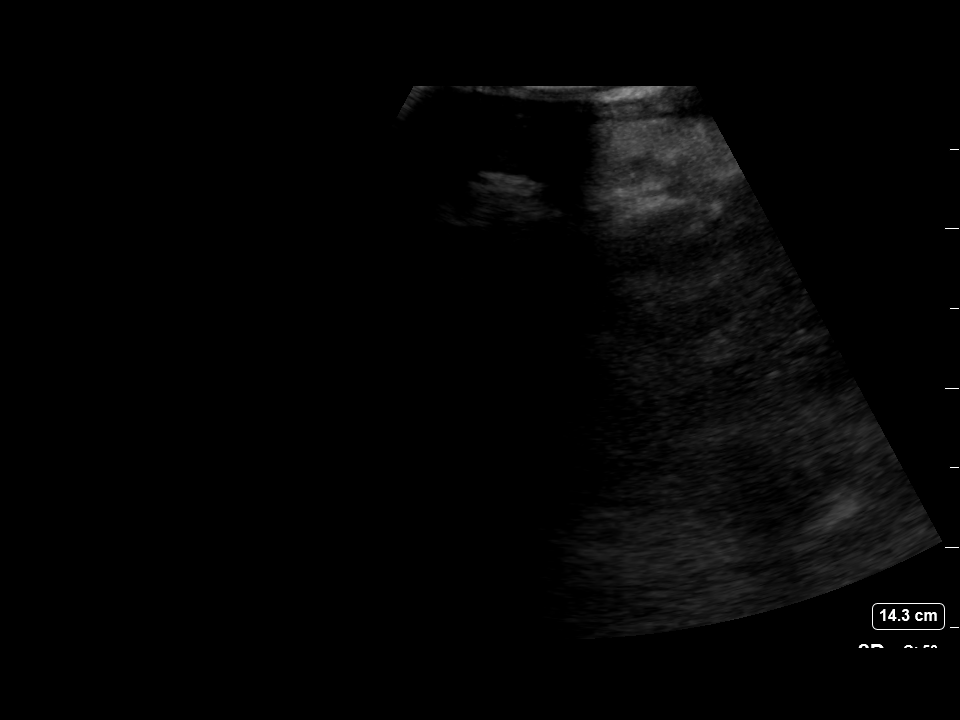
[im 2/2]
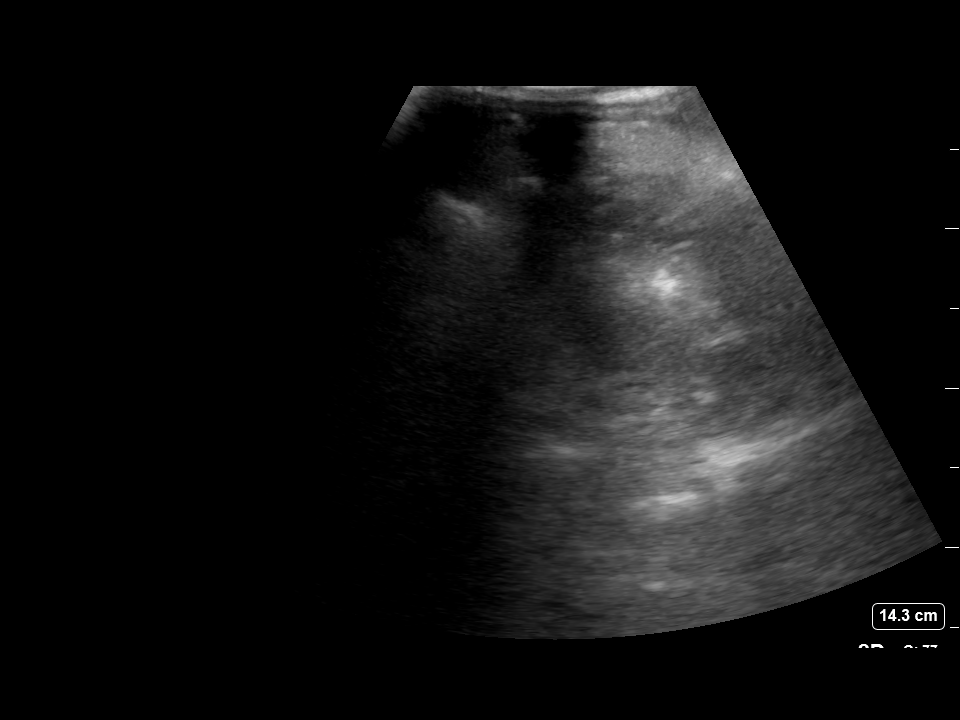

[2 of 2 positions shown; findings below may reference images not displayed]

EXAM:
ULTRASOUND GUIDED THERAPEUTIC PARACENTESIS

MEDICATIONS:
1% lidocaine to skin and subcutaneous tissue

COMPLICATIONS:
None immediate.

PROCEDURE:
Informed written consent was obtained from the patient after a
discussion of the risks, benefits and alternatives to treatment. A
timeout was performed prior to the initiation of the procedure.

Initial ultrasound scanning demonstrates a moderate to large amount
of ascites within the left mid to lower abdominal quadrant. The left
mid to lower abdomen was prepped and draped in the usual sterile
fashion. 1% lidocaine was used for local anesthesia.

Following this, a 19 gauge, 7-cm, Yueh catheter was introduced. An
ultrasound image was saved for documentation purposes. The
paracentesis was performed. The catheter was removed and a dressing
was applied. The patient tolerated the procedure well without
immediate post procedural complication.
FINDINGS: A total of approximately 3.8 liters of blood-tinged fluid was
removed.
IMPRESSION: Successful ultrasound-guided therapeutic paracentesis yielding
liters of peritoneal fluid.

## 2022-06-12 IMAGING — US IR PARACENTESIS
1 series · 2 of 2 positions shown · non-contrast
Comparison: none

INDICATION: History of end-stage renal disease, cirrhosis with recurrent
ascites. Request for therapeutic paracentesis.

[Series 1: ir (id) (id)/(id)/(id) ir · 2 of 2 slices shown]
[im 1/2]
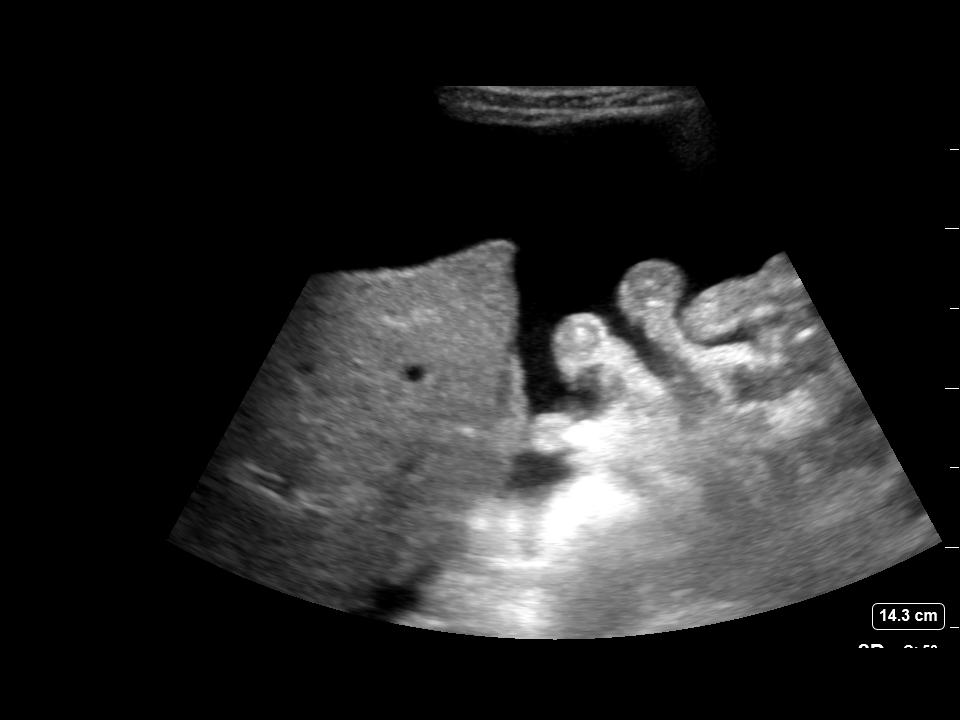
[im 2/2]
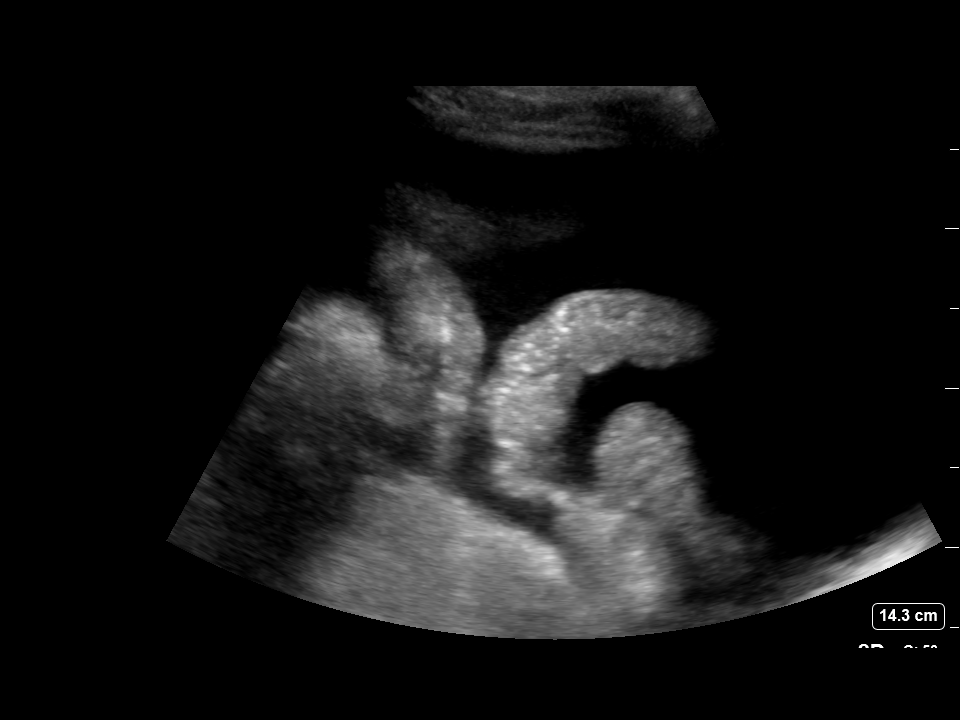

[2 of 2 positions shown; findings below may reference images not displayed]

EXAM:
ULTRASOUND GUIDED LEFT LOWER QUADRANT PARACENTESIS

MEDICATIONS:
None.

COMPLICATIONS:
None immediate.

PROCEDURE:
Informed written consent was obtained from the patient after a
discussion of the risks, benefits and alternatives to treatment. A
timeout was performed prior to the initiation of the procedure.

Initial ultrasound scanning demonstrates a large amount of ascites
within the left lower abdominal quadrant. The left lower abdomen was
prepped and draped in the usual sterile fashion. 1% lidocaine was
used for local anesthesia.

Following this, a 19 gauge, 7-cm, Yueh catheter was introduced. An
ultrasound image was saved for documentation purposes. The
paracentesis was performed. The catheter was removed and a dressing
was applied. The patient tolerated the procedure well without
immediate post procedural complication.
FINDINGS: A total of approximately 4.6 L of blood-tinged fluid was removed.
IMPRESSION: Successful ultrasound-guided paracentesis yielding 4.6 liters of
peritoneal fluid.

## 2022-06-19 IMAGING — US IR PARACENTESIS
1 series · 2 of 2 positions shown · non-contrast
Comparison: none

INDICATION: End-stage renal disease on hemodialysis with recurrent ascites.
Request for therapeutic paracentesis.

[Series 1: ir (id) (id)/(id)/(id) ir · 2 of 2 slices shown]
[im 1/2]
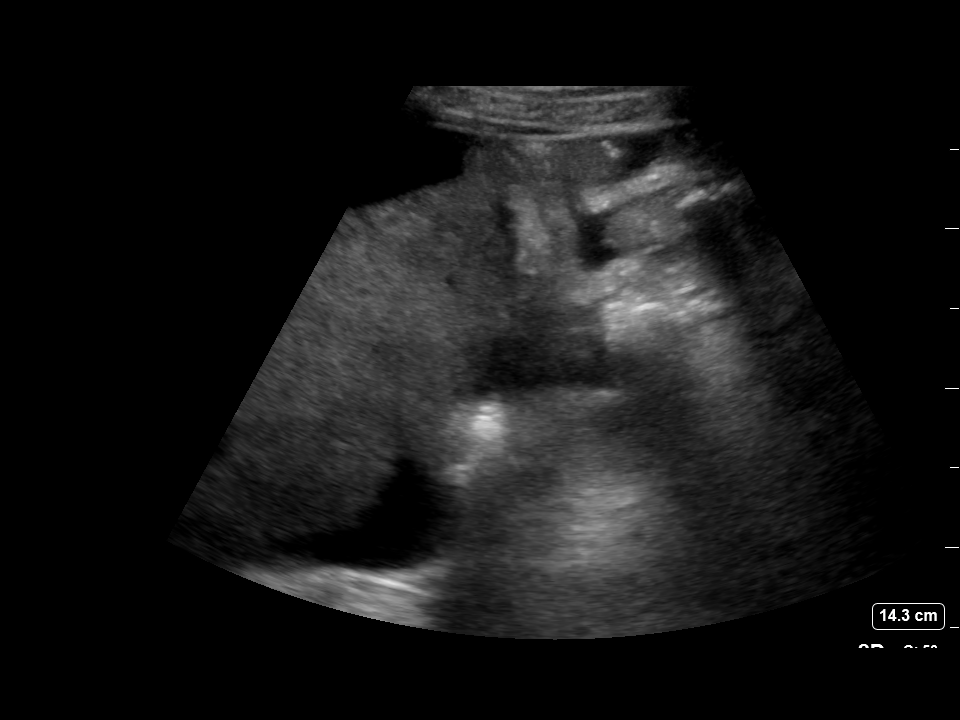
[im 2/2]
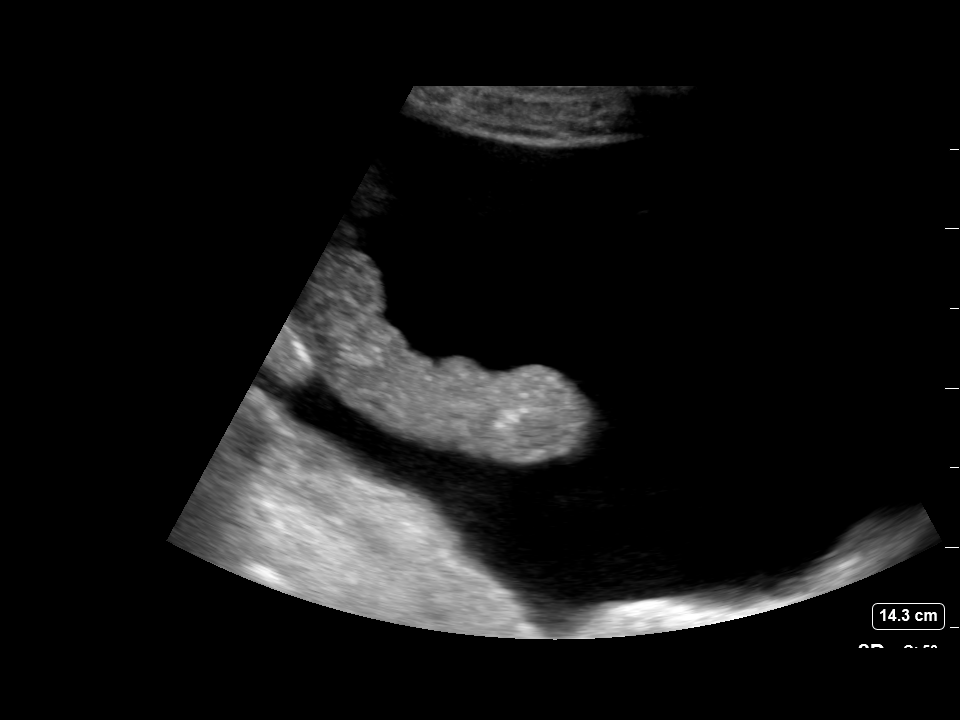

[2 of 2 positions shown; findings below may reference images not displayed]

EXAM:
ULTRASOUND GUIDED PARACENTESIS

MEDICATIONS:
1% lidocaine 10 mL

COMPLICATIONS:
None immediate.

PROCEDURE:
Informed written consent was obtained from the patient after a
discussion of the risks, benefits and alternatives to treatment. A
timeout was performed prior to the initiation of the procedure.

Initial ultrasound scanning demonstrates a moderate amount of
ascites within the left lower abdominal quadrant. The left lower
abdomen was prepped and draped in the usual sterile fashion. 1%
lidocaine was used for local anesthesia.

Following this, a 19 gauge, 10 cm, Yueh catheter was introduced. An
ultrasound image was saved for documentation purposes. The
paracentesis was performed. The catheter was removed and a dressing
was applied. The patient tolerated the procedure well without
immediate post procedural complication.
FINDINGS: A total of approximately 3.4 L of amber fluid was removed.
IMPRESSION: Successful ultrasound-guided paracentesis yielding 3.4 liters of
peritoneal fluid.

## 2022-07-01 IMAGING — US IR PARACENTESIS
1 series · 3 of 3 positions shown · non-contrast
Comparison: none

INDICATION: ESRD with recurrent ascites.  Request for therapeutic paracentesis.

[Series 1: ir (id) (id)/(id)/(id) ir · 3 of 3 slices shown]
[im 1/3]
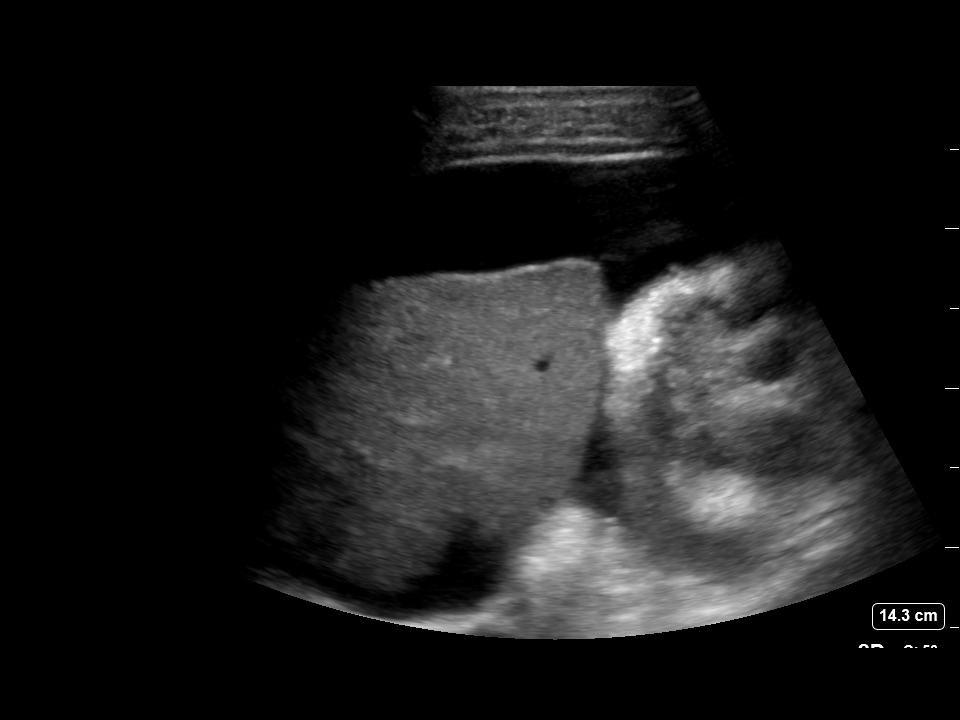
[im 2/3]
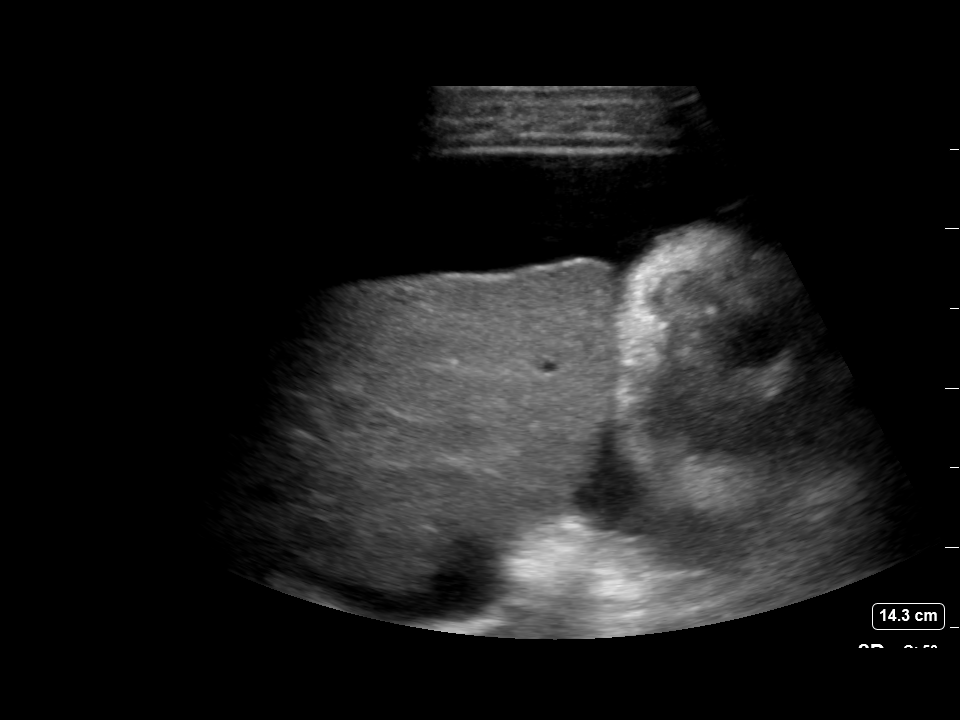
[im 3/3]
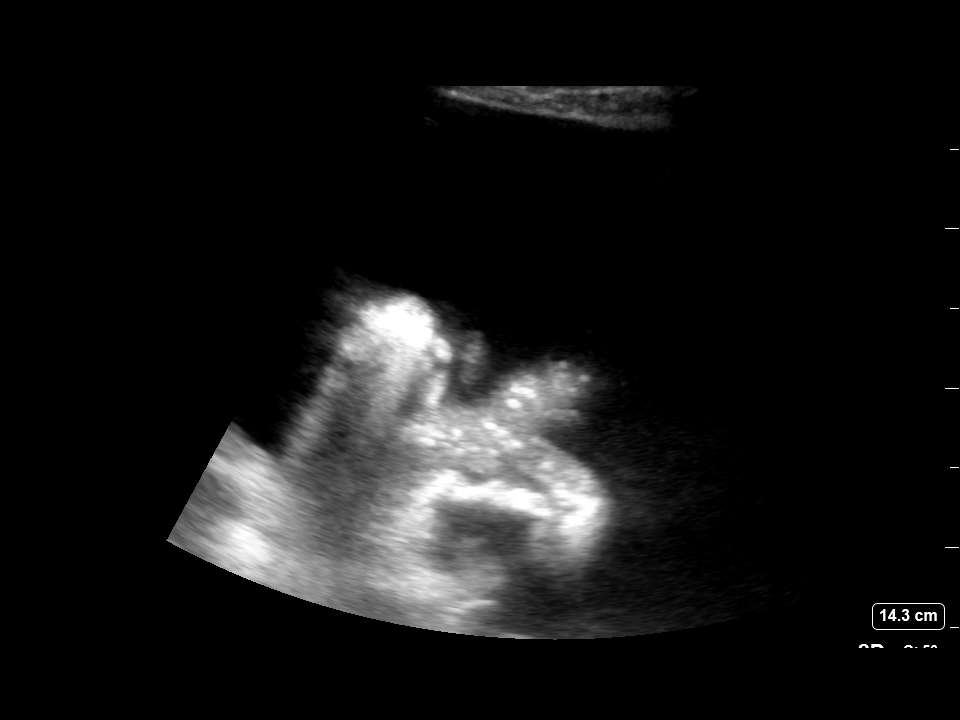

[3 of 3 positions shown; findings below may reference images not displayed]

EXAM:
ULTRASOUND GUIDED PARACENTESIS

MEDICATIONS:
10 mL 1% lidocaine

COMPLICATIONS:
None immediate.

PROCEDURE:
Informed written consent was obtained from the patient after a
discussion of the risks, benefits and alternatives to treatment. A
timeout was performed prior to the initiation of the procedure.

Initial ultrasound scanning demonstrates a large amount of ascites
within the right lower abdominal quadrant. The right lower abdomen
was prepped and draped in the usual sterile fashion. 1% lidocaine
was used for local anesthesia.

Following this, a 19 gauge, 7-cm, Yueh catheter was introduced. An
ultrasound image was saved for documentation purposes. The
paracentesis was performed. The catheter was removed and a dressing
was applied. The patient tolerated the procedure well without
immediate post procedural complication.
FINDINGS: A total of approximately 4.2 L of cloudy blood-tinged fluid was
removed.
IMPRESSION: Successful ultrasound-guided paracentesis yielding 4.2 liters of
peritoneal fluid.

## 2022-07-08 IMAGING — US IR PARACENTESIS
1 series · 2 of 2 positions shown · non-contrast
Comparison: none

INDICATION: Patient with a history of end-stage renal disease and recurrent
ascites presents today for a therapeutic paracentesis.

[Series 1: ir (id) (id)/(id)/(id) ir · 2 of 2 slices shown]
[im 1/2]
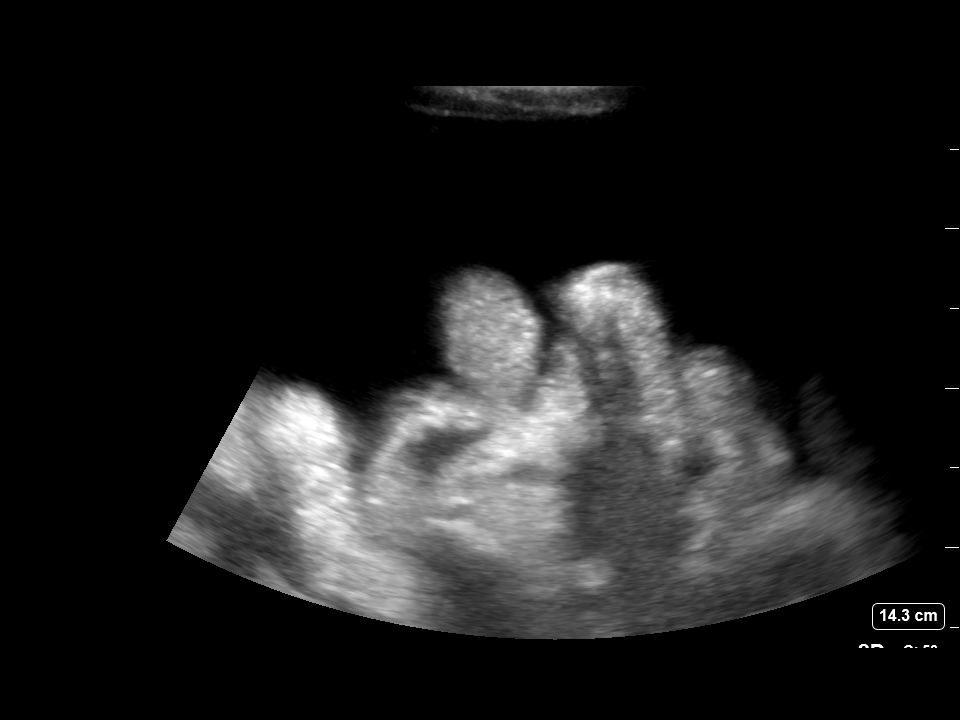
[im 2/2]
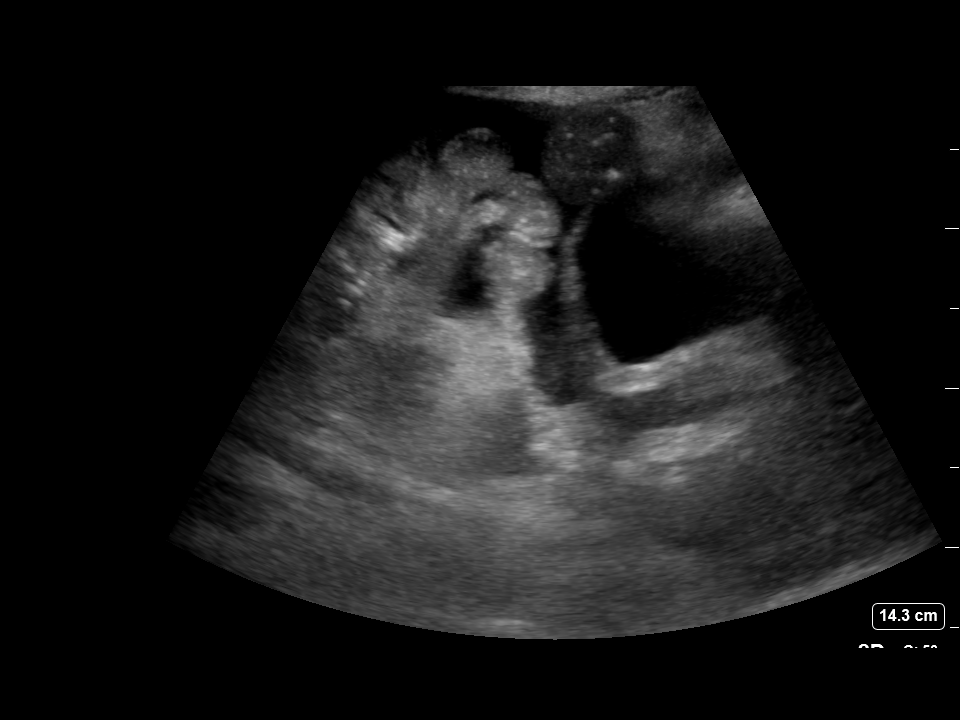

[2 of 2 positions shown; findings below may reference images not displayed]

EXAM:
ULTRASOUND GUIDED PARACENTESIS

MEDICATIONS:
1% lidocaine 10 mL

COMPLICATIONS:
None immediate.

PROCEDURE:
Informed written consent was obtained from the patient after a
discussion of the risks, benefits and alternatives to treatment. A
timeout was performed prior to the initiation of the procedure.

Initial ultrasound scanning demonstrates a large amount of ascites
within the left lower abdominal quadrant. The left lower abdomen was
prepped and draped in the usual sterile fashion. 1% lidocaine was
used for local anesthesia.

Following this, a 19 gauge, 7-cm, Yueh catheter was introduced. An
ultrasound image was saved for documentation purposes. The
paracentesis was performed. The catheter was removed and a dressing
was applied. The patient tolerated the procedure well without
immediate post procedural complication.
FINDINGS: A total of approximately 3.6 L of cloudy pink fluid was removed.
IMPRESSION: Successful ultrasound-guided paracentesis yielding 3.6 liters of
peritoneal fluid. Read by: Roberto Tiger, NP

## 2022-07-15 IMAGING — US IR PARACENTESIS
1 series · 2 of 2 positions shown · non-contrast
Comparison: none

INDICATION: Patient with history of end-stage disease, cirrhosis, hepatitis B/C,
recurrent ascites. Request received for therapeutic paracentesis up
to 4 liters.

[Series 1: ir (id) (id)/(id)/(id) ir · 2 acquisitions, 2 frames shown]
[im 1/2]
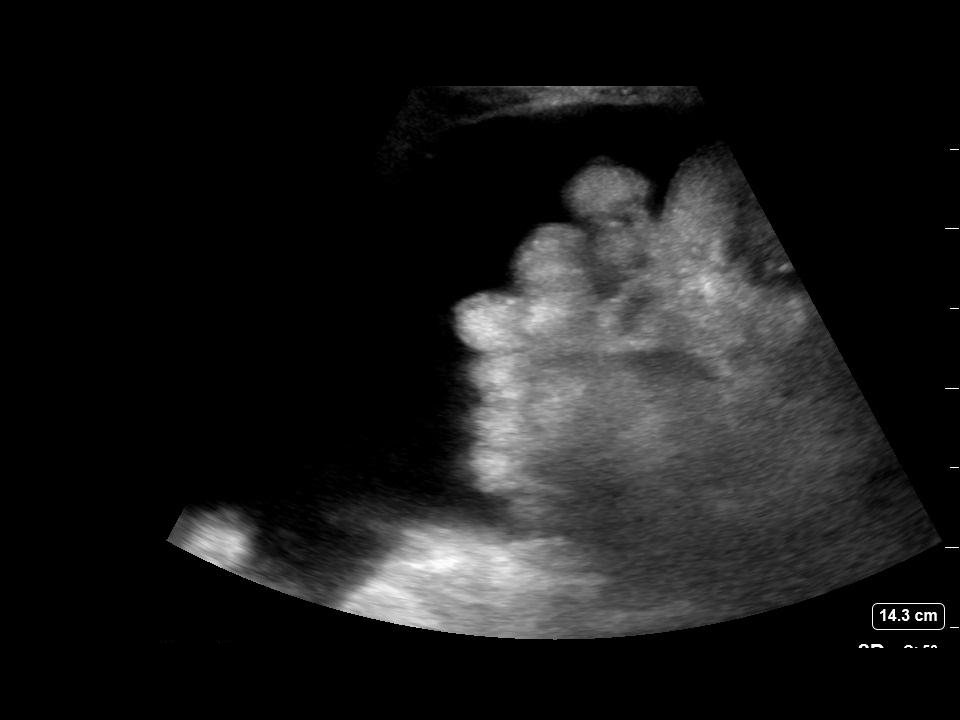
[im 2/2]
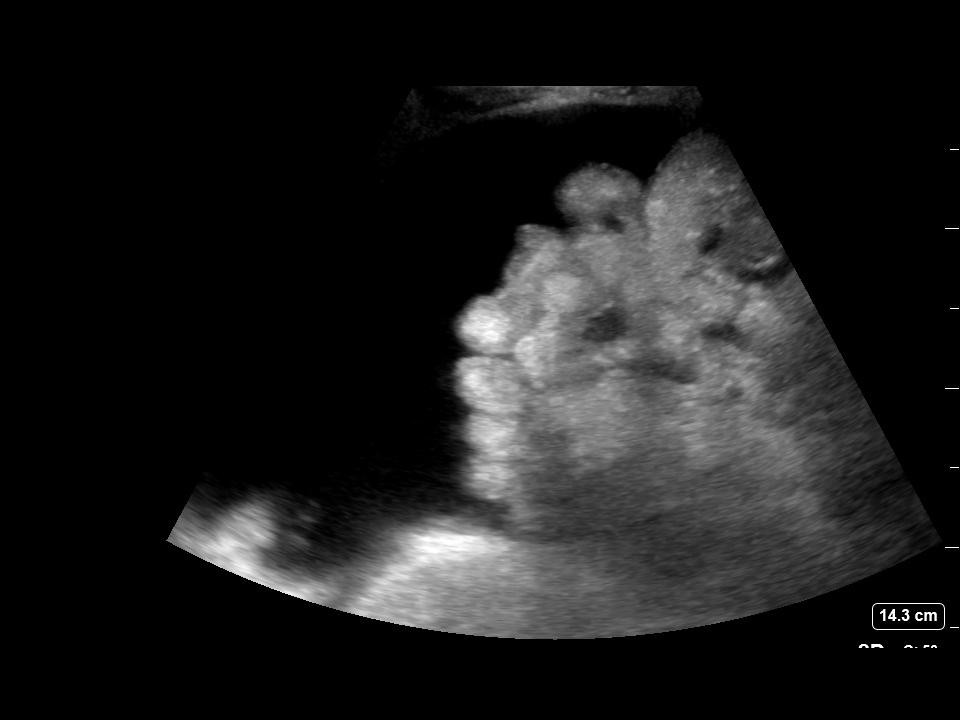

[2 of 2 positions shown; findings below may reference images not displayed]

EXAM:
ULTRASOUND GUIDED THERAPEUTIC PARACENTESIS

MEDICATIONS:
1% lidocaine to skin and subcutaneous tissue

COMPLICATIONS:
None immediate.

PROCEDURE:
Informed written consent was obtained from the patient after a
discussion of the risks, benefits and alternatives to treatment. A
timeout was performed prior to the initiation of the procedure.

Initial ultrasound scanning demonstrates a moderate amount of
ascites within the left mid to lower abdominal quadrant. The left
mid to lower abdomen was prepped and draped in the usual sterile
fashion. 1% lidocaine was used for local anesthesia.

Following this, a 19 gauge, 7-cm, Yueh catheter was introduced. An
ultrasound image was saved for documentation purposes. The
paracentesis was performed. The catheter was removed and a dressing
was applied. The patient tolerated the procedure well without
immediate post procedural complication.
FINDINGS: A total of approximately 2.7 liters of blood-tinged fluid was
removed.
IMPRESSION: Successful ultrasound-guided therapeutic paracentesis yielding
liters of peritoneal fluid.
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
## 2022-07-29 IMAGING — US IR PARACENTESIS
1 series · 3 of 3 positions shown · non-contrast
Comparison: none

INDICATION: History of cirrhosis with recurrent ascites. Request for therapeutic
paracentesis.

[Series 1: ir (id) (id)/(id)/(id) ir · 3 of 3 slices shown]
[im 1/3]
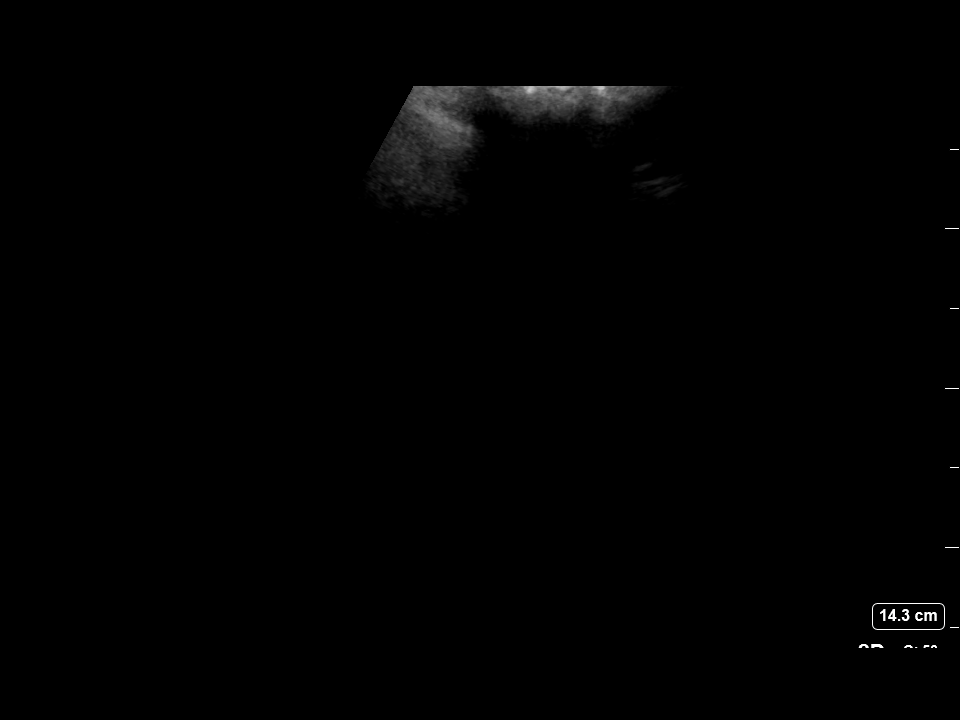
[im 2/3]
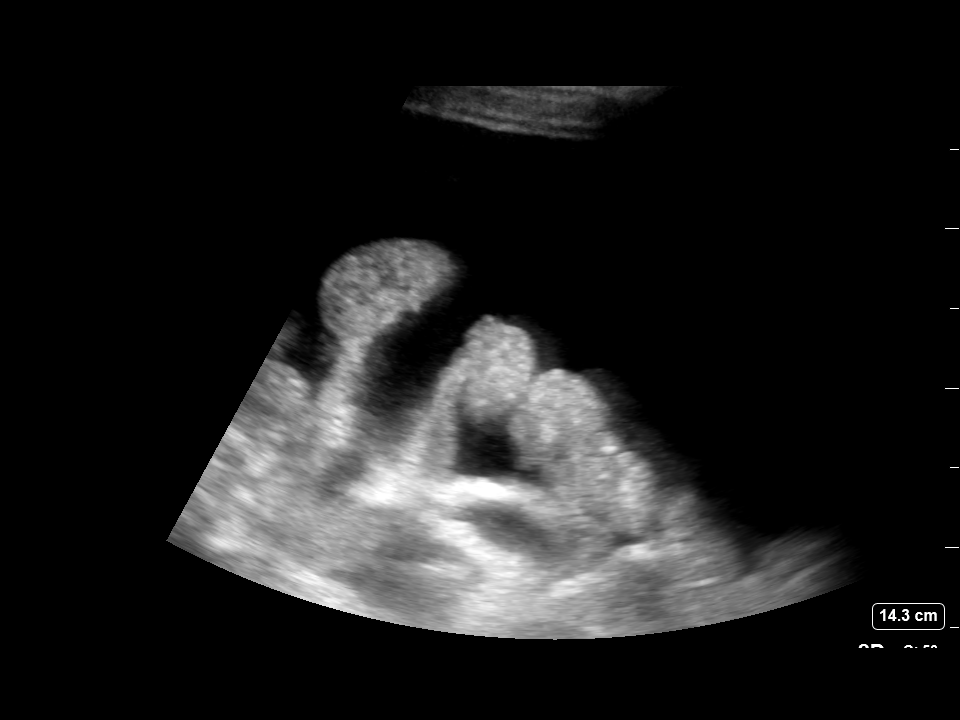
[im 3/3]
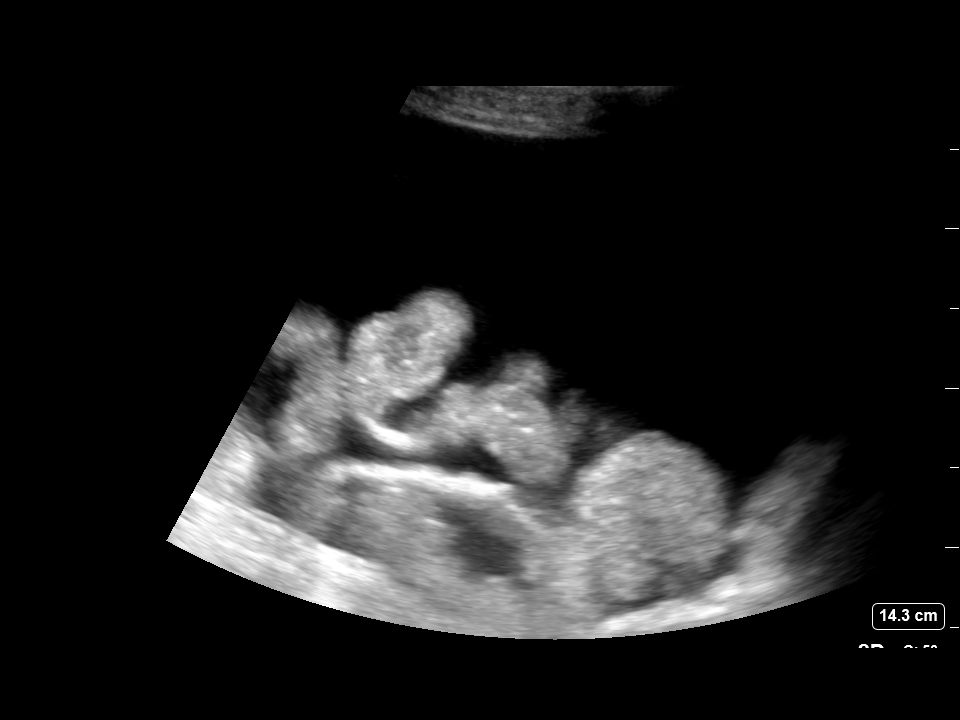

[3 of 3 positions shown; findings below may reference images not displayed]

EXAM:
ULTRASOUND GUIDED LEFT LOWER QUADRANT PARACENTESIS

MEDICATIONS:
1% plain lidocaine, 5 mL

COMPLICATIONS:
None immediate.

PROCEDURE:
Informed written consent was obtained from the patient after a
discussion of the risks, benefits and alternatives to treatment. A
timeout was performed prior to the initiation of the procedure.

Initial ultrasound scanning demonstrates a large amount of ascites
within the left lower abdominal quadrant. The left lower abdomen was
prepped and draped in the usual sterile fashion. 1% lidocaine was
used for local anesthesia.

Following this, a 19 gauge, 7-cm, Yueh catheter was introduced. An
ultrasound image was saved for documentation purposes. The
paracentesis was performed. The catheter was removed and a dressing
was applied. The patient tolerated the procedure well without
immediate post procedural complication.
FINDINGS: A total of approximately 4.6 L of dark amber fluid was removed.
IMPRESSION: Successful ultrasound-guided paracentesis yielding 4.6 liters of
peritoneal fluid.

## 2022-08-05 IMAGING — US IR PARACENTESIS
1 series · 2 of 2 positions shown · non-contrast
Comparison: none

INDICATION: Patient with history of end-stage renal disease, cirrhosis,
hepatitis B/C, recurrent ascites; request received for therapeutic
paracentesis up to 4 liters.

[Series 1: ir (id) (id)/(id)/(id) ir · 2 of 2 slices shown]
[im 1/2]
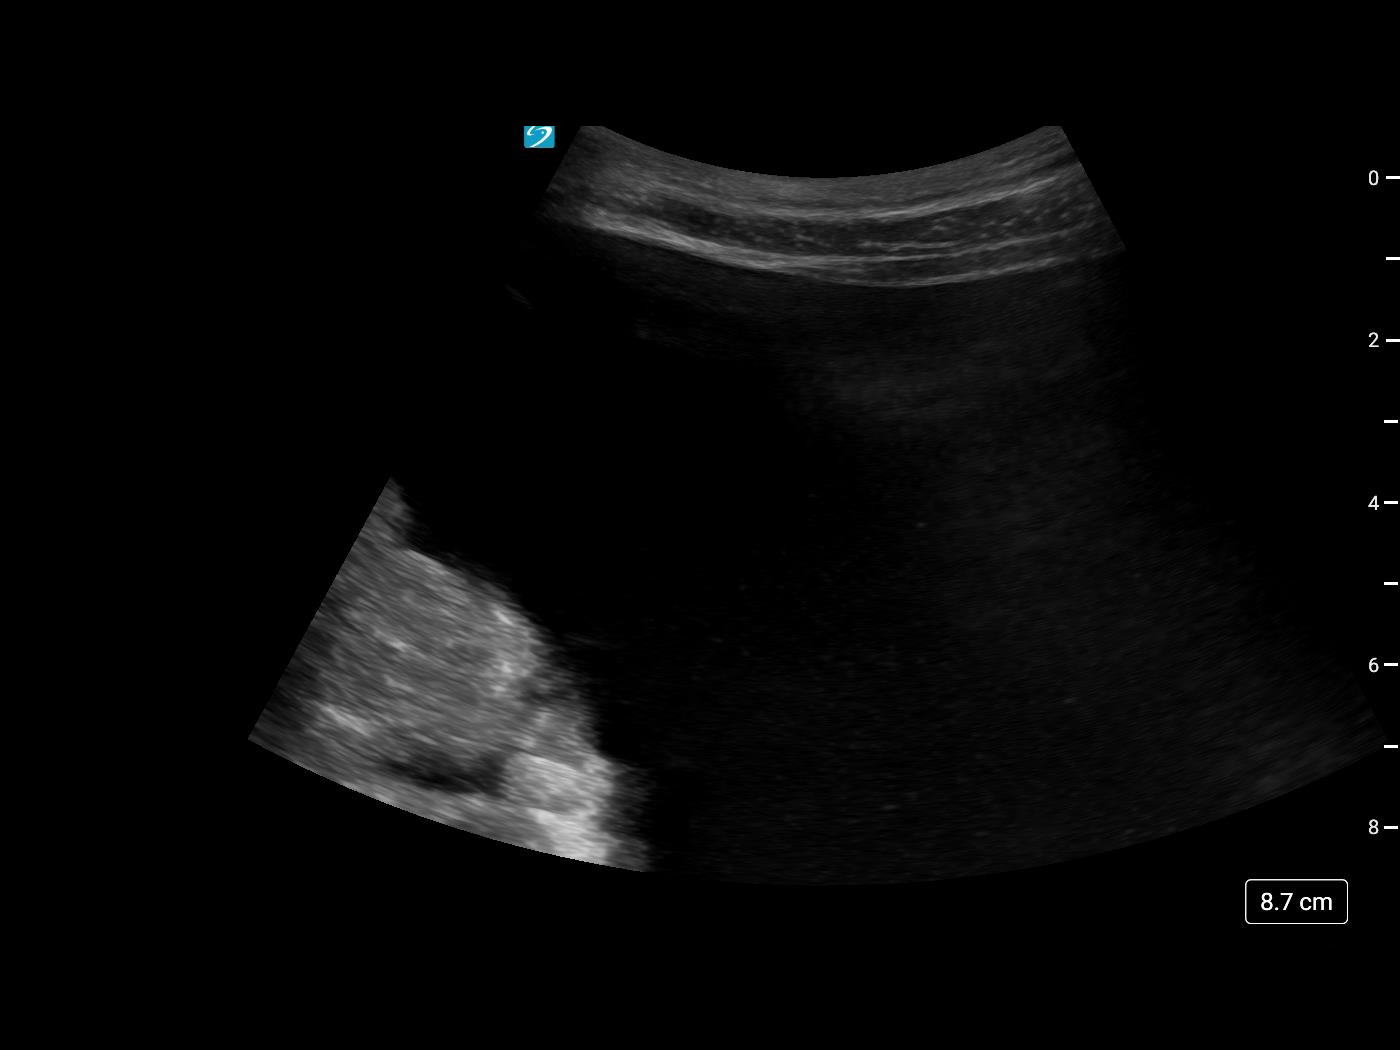
[im 2/2]
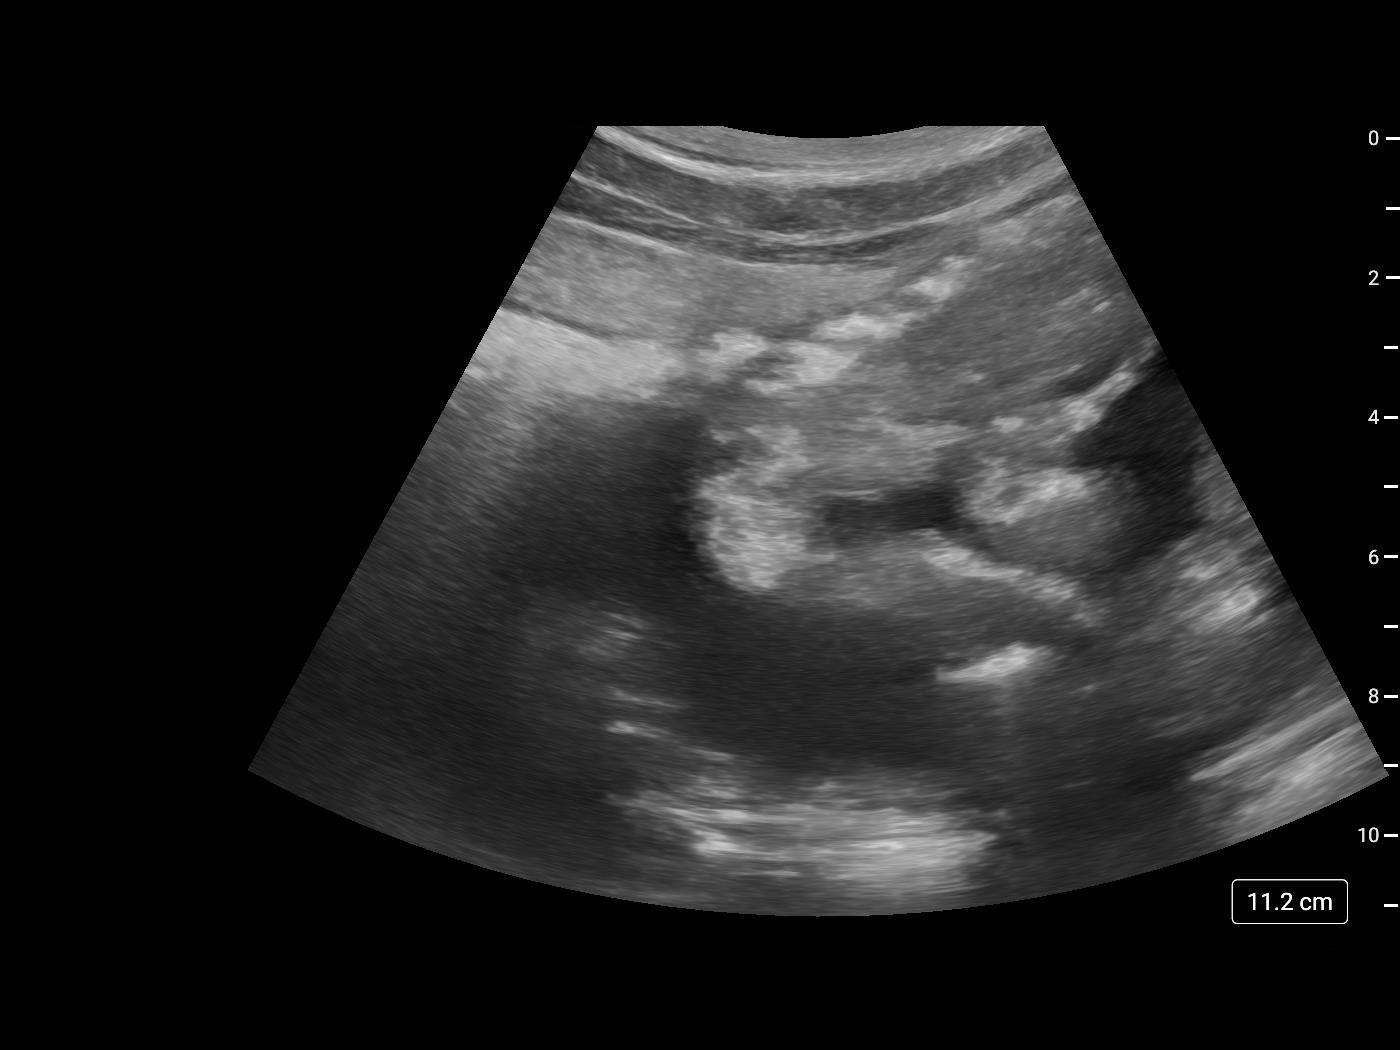

[2 of 2 positions shown; findings below may reference images not displayed]

EXAM:
ULTRASOUND GUIDED THERAPEUTIC  PARACENTESIS

MEDICATIONS:
1% lidocaine to skin and SQ tissue

COMPLICATIONS:
None immediate.

PROCEDURE:
Informed written consent was obtained from the patient after a
discussion of the risks, benefits and alternatives to treatment. A
timeout was performed prior to the initiation of the procedure.

Initial ultrasound scanning demonstrates a large amount of ascites
within the left lower abdominal quadrant. The left lower abdomen was
prepped and draped in the usual sterile fashion. 1% lidocaine was
used for local anesthesia.

Following this, a 19 gauge, 10-cm, Yueh catheter was introduced. An
ultrasound image was saved for documentation purposes. The
paracentesis was performed. The catheter was removed and a dressing
was applied. The patient tolerated the procedure well without
immediate post procedural complication.
FINDINGS: A total of approximately 4 liters of blood-tinged fluid was removed.
IMPRESSION: Successful ultrasound-guided therapeutic paracentesis yielding 4
liters of peritoneal fluid.

## 2022-08-12 IMAGING — US IR PARACENTESIS
1 series · 3 of 3 positions shown · non-contrast
Comparison: none

INDICATION: End-stage renal disease on hemodialysis. Abdominal distention
secondary to recurrent ascites. Request for therapeutic
paracentesis.

[Series 1: ir (id) (id)/(id)/(id) ir · 3 of 3 slices shown]
[im 1/3]
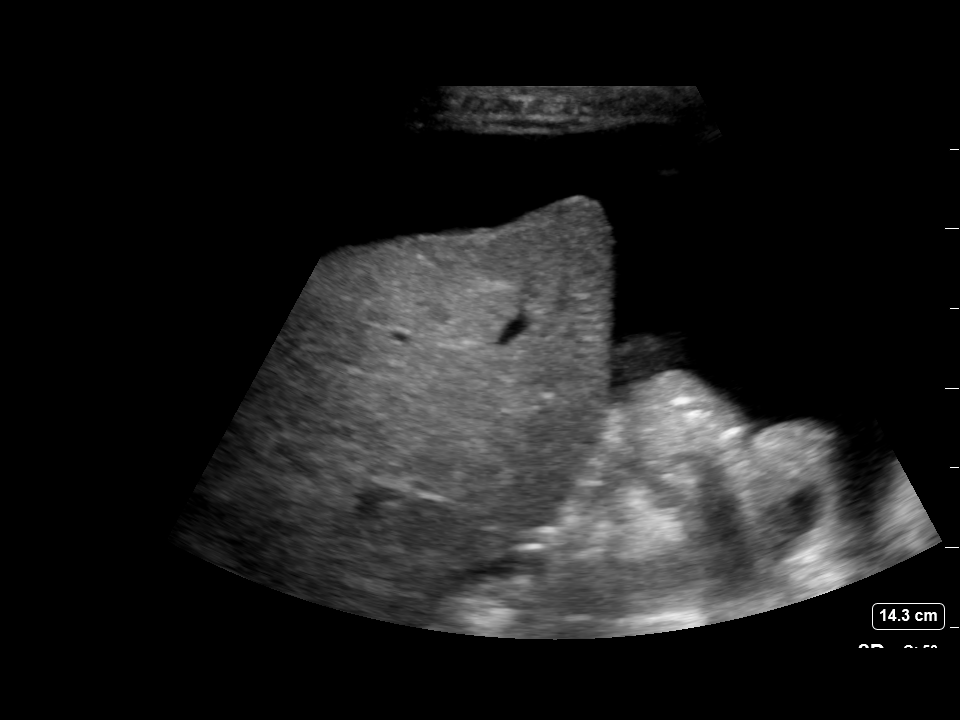
[im 2/3]
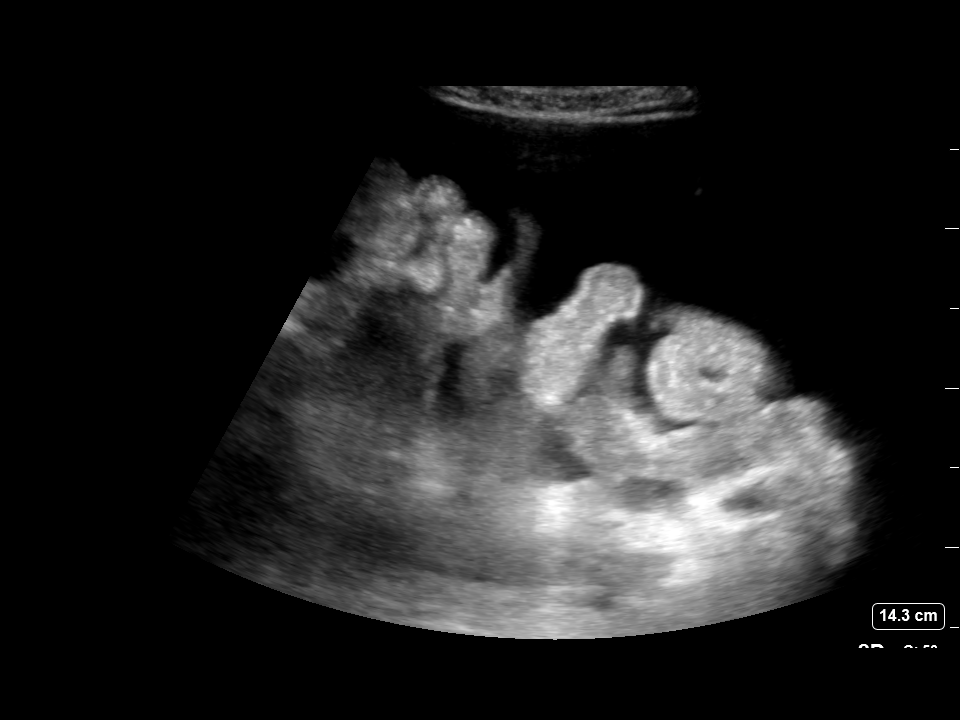
[im 3/3]
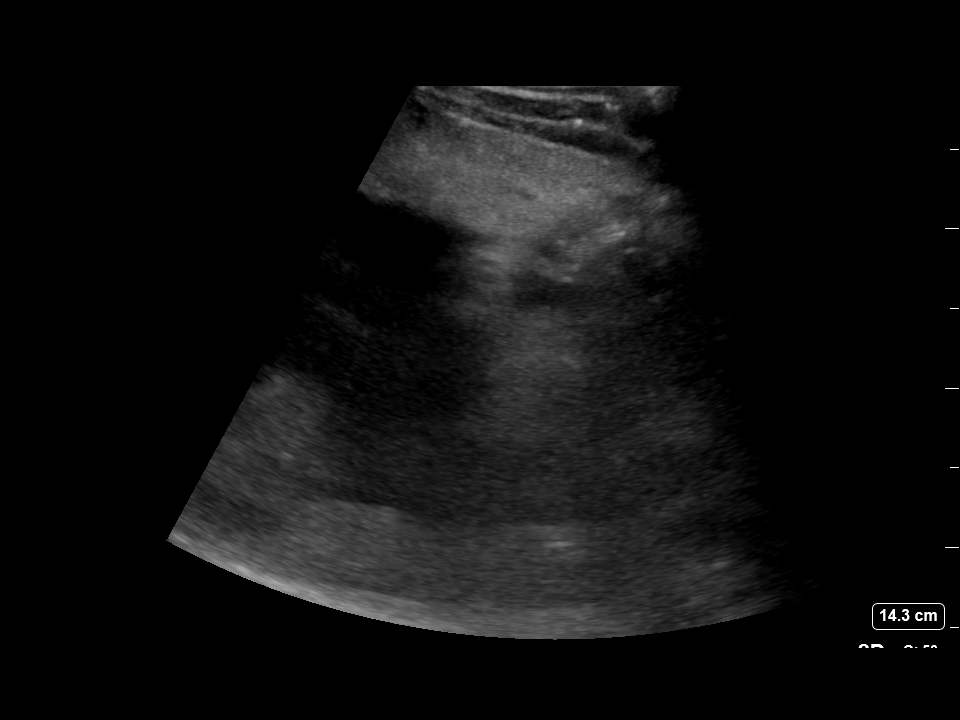

[3 of 3 positions shown; findings below may reference images not displayed]

EXAM:
ULTRASOUND GUIDED LEFT LOWER QUADRANT PARACENTESIS

MEDICATIONS:
1% plain lidocaine, 5 mL

COMPLICATIONS:
None immediate.

PROCEDURE:
Informed written consent was obtained from the patient after a
discussion of the risks, benefits and alternatives to treatment. A
timeout was performed prior to the initiation of the procedure.

Initial ultrasound scanning demonstrates a large amount of ascites
within the left lower abdominal quadrant. The left lower abdomen was
prepped and draped in the usual sterile fashion. 1% lidocaine was
used for local anesthesia.

Following this, a 19 gauge, 7-cm, Yueh catheter was introduced. An
ultrasound image was saved for documentation purposes. The
paracentesis was performed. The catheter was removed and a dressing
was applied. The patient tolerated the procedure well without
immediate post procedural complication.
FINDINGS: A total of approximately 4.0 L of serosanguineous fluid was removed.
IMPRESSION: Successful ultrasound-guided paracentesis yielding 4 liters of
peritoneal fluid.
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
## 2022-09-01 IMAGING — DX DG CHEST 1V PORT
2 series · 2 of 2 positions shown · non-contrast
Comparison: Chest radiograph 09/20/2020

CLINICAL DATA: Altered mental status

EXAM:
PORTABLE CHEST 1 VIEW

[chest ap (1 of 2)]
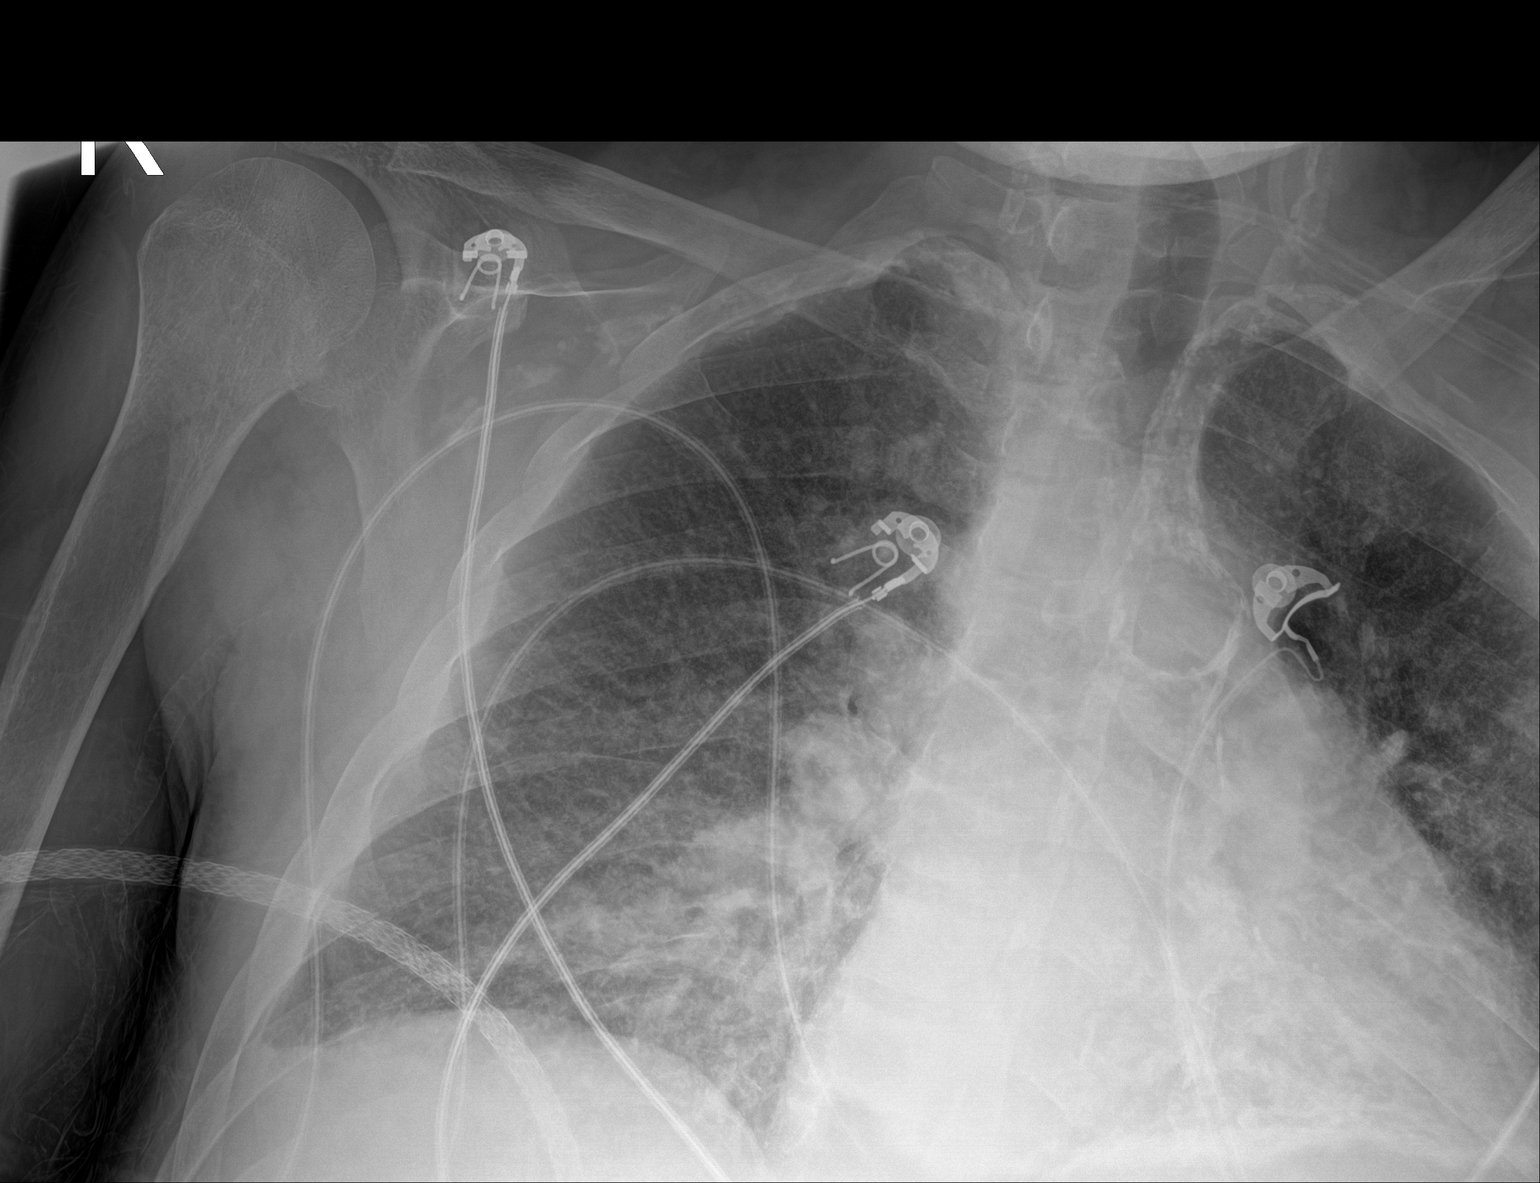

[chest ap (2 of 2)]
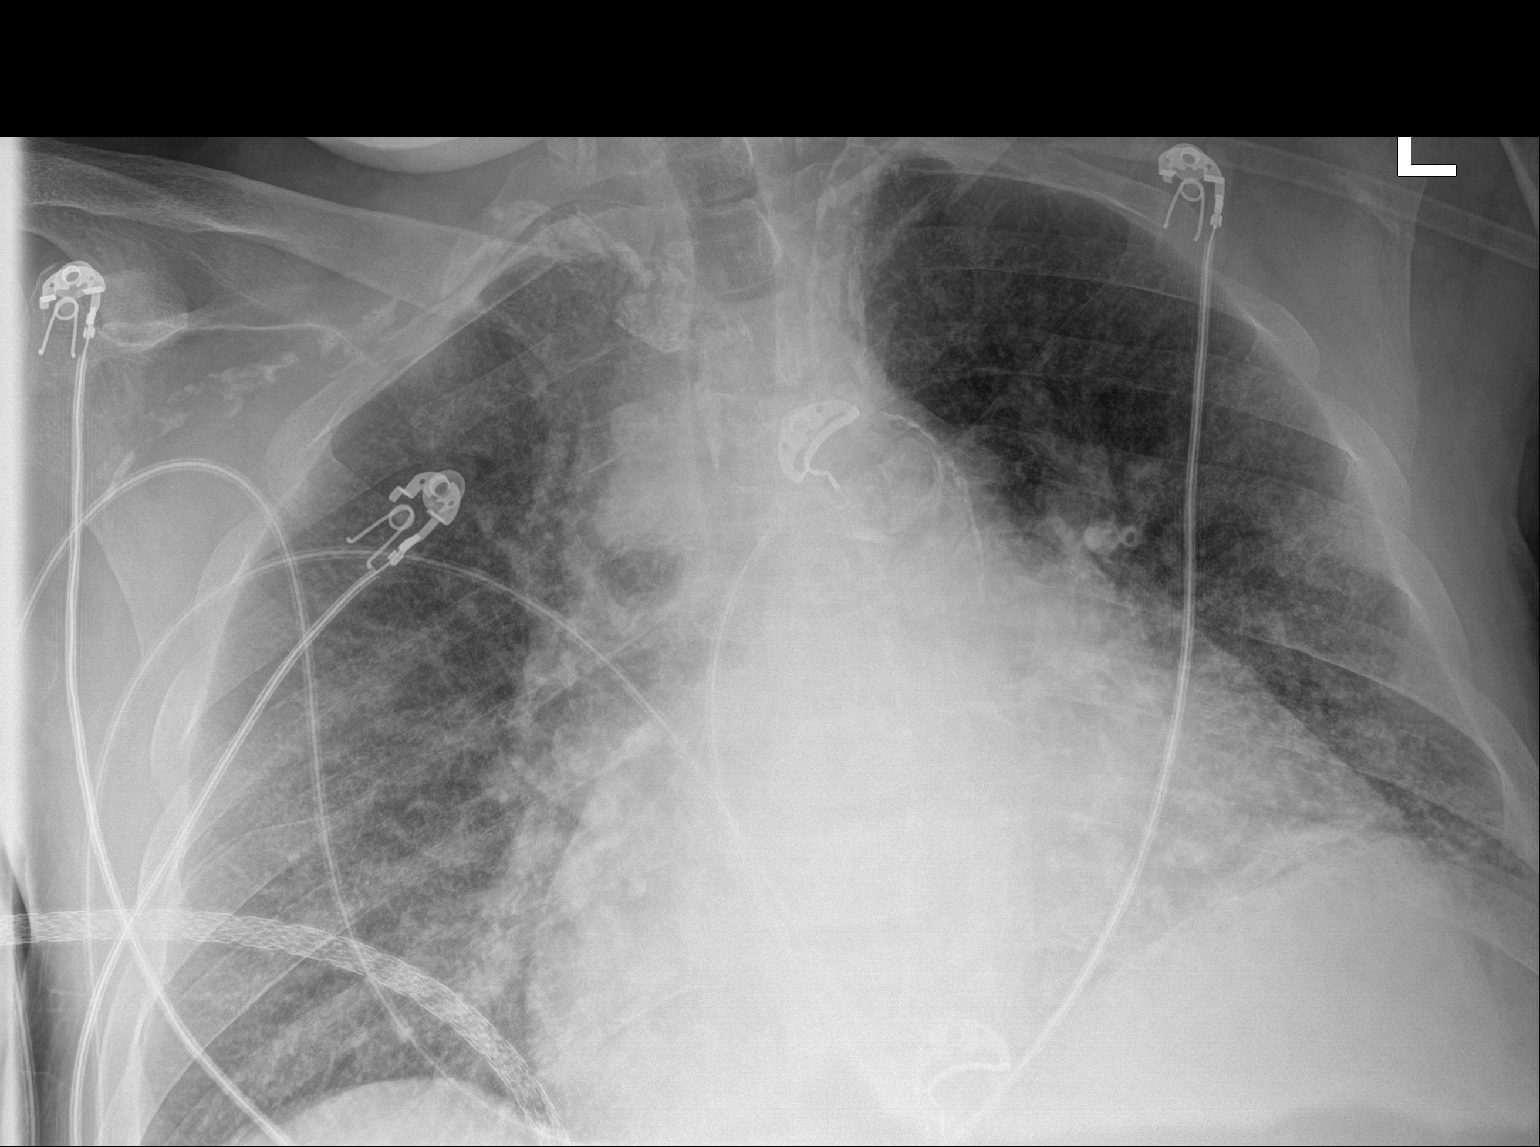

[2 of 2 positions shown; findings below may reference images not displayed]

FINDINGS: The heart is markedly enlarged the mediastinal contours are grossly
within normal limits. There is calcified atherosclerotic plaque of
the vasculature.

There is vascular congestion without definite frank interstitial
edema. Patchy opacities in the lung bases likely reflect
subsegmental atelectasis. There is a trace right effusion. There is
no definite left effusion. There is no appreciable pneumothorax.

There is no acute osseous abnormality.
IMPRESSION: 1. Bibasilar subsegmental atelectasis and vascular congestion but no
definite frank interstitial edema.
2. Trace right pleural effusion.
3. Marked cardiomegaly.
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
## 2022-09-14 IMAGING — CR DG CHEST 1V
1 series · 1 of 1 positions shown · non-contrast
Comparison: [DATE] a.m.

CLINICAL DATA: Dyspnea

EXAM:
CHEST  1 VIEW

[chest ap]
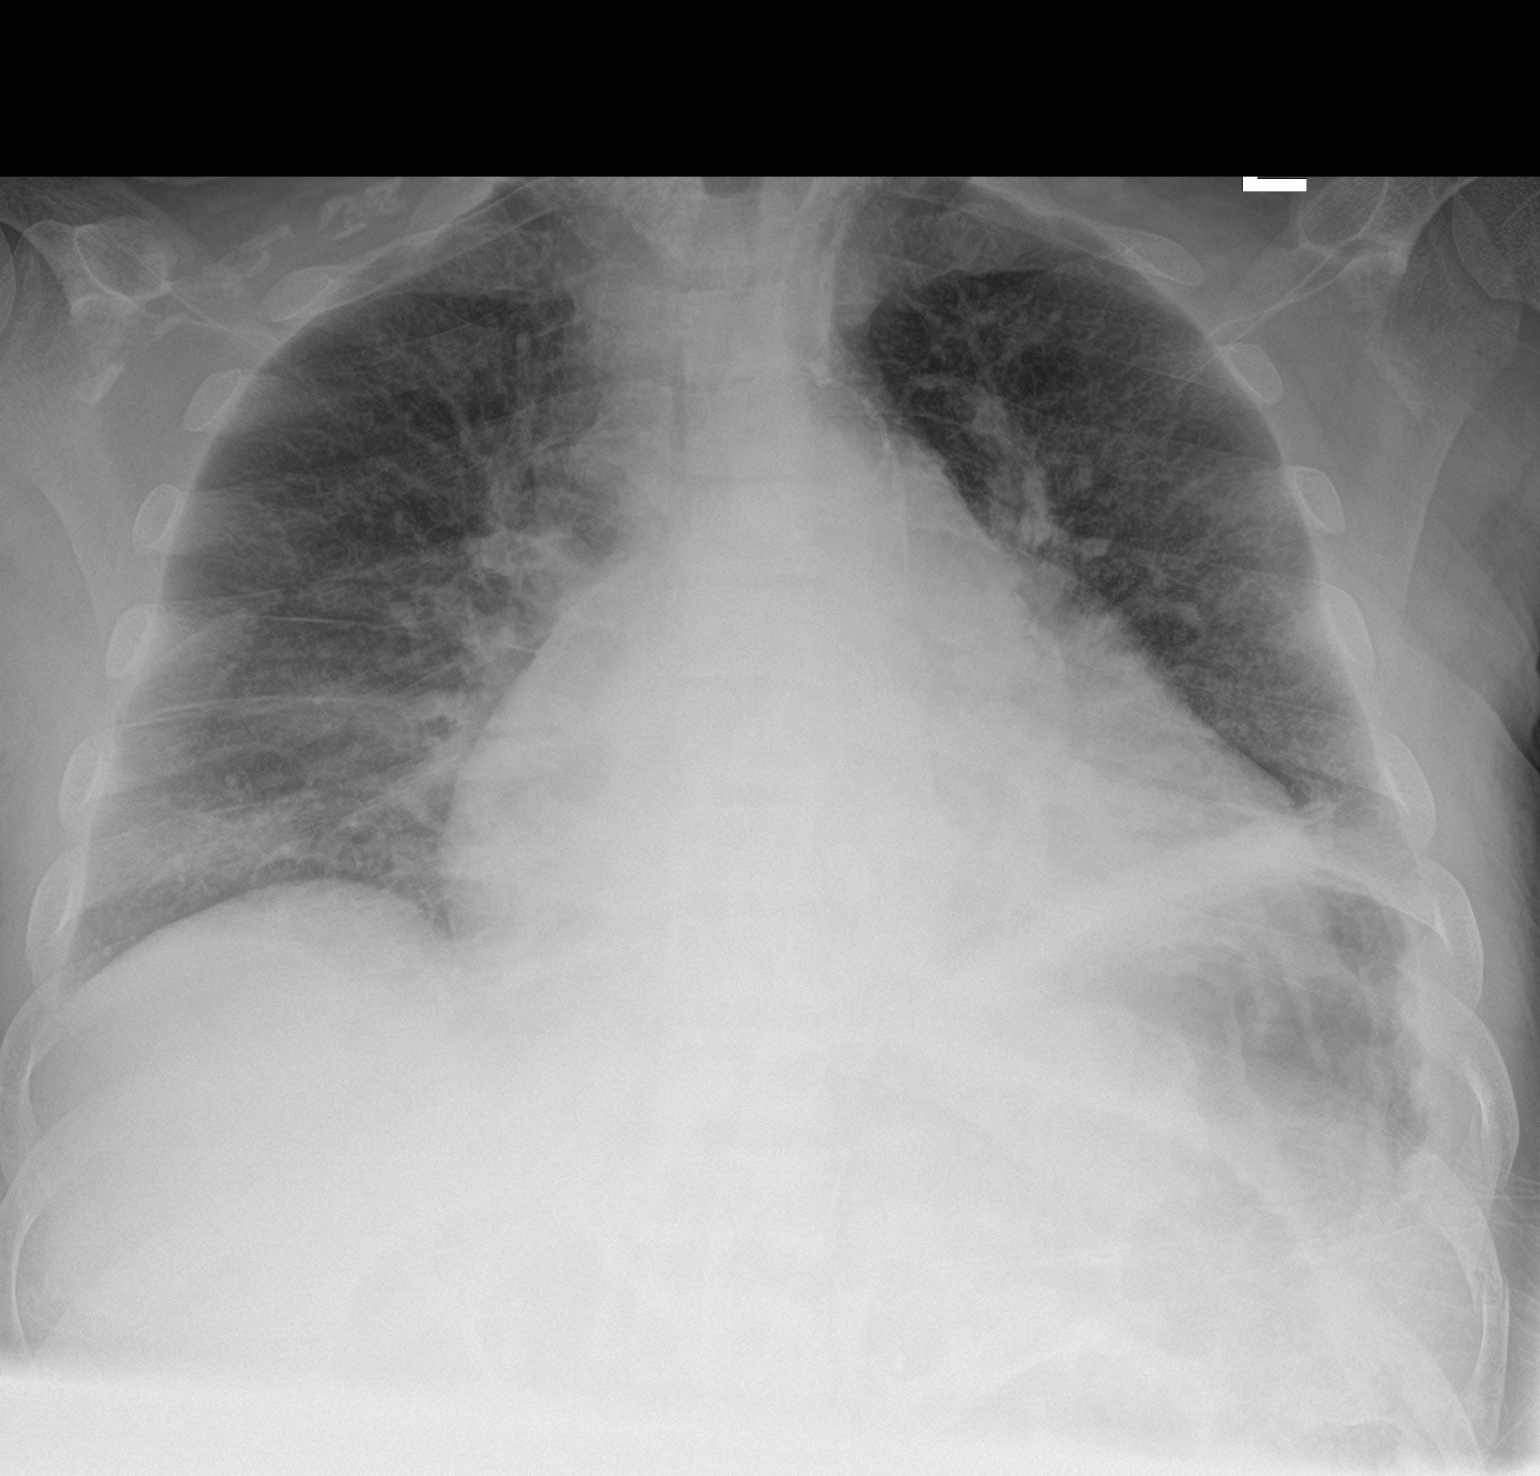

[1 of 1 positions shown; findings below may reference images not displayed]

FINDINGS: Lung volumes are small and pulmonary insufflation has slightly
diminished since prior examination. Mild left basilar atelectasis.
No pneumothorax or pleural effusion. Cardiomegaly is stable when
accounting for poor pulmonary insufflation. Central pulmonary
vascular congestion has improved slightly in the interval. No overt
pulmonary edema. Advanced vascular calcifications are seen within
the aortic arch and arch vasculature.
IMPRESSION: Pulmonary hypoinflation.

Stable cardiomegaly. Improved central pulmonary vascular congestion.

Peripheral vascular disease.

## 2022-09-14 IMAGING — DX DG CHEST 1V PORT
1 series · 1 of 1 positions shown · non-contrast
Comparison: 03/13/2021 radiograph and CT

CLINICAL DATA: Shortness of breath

EXAM:
PORTABLE CHEST 1 VIEW

[chest ap]
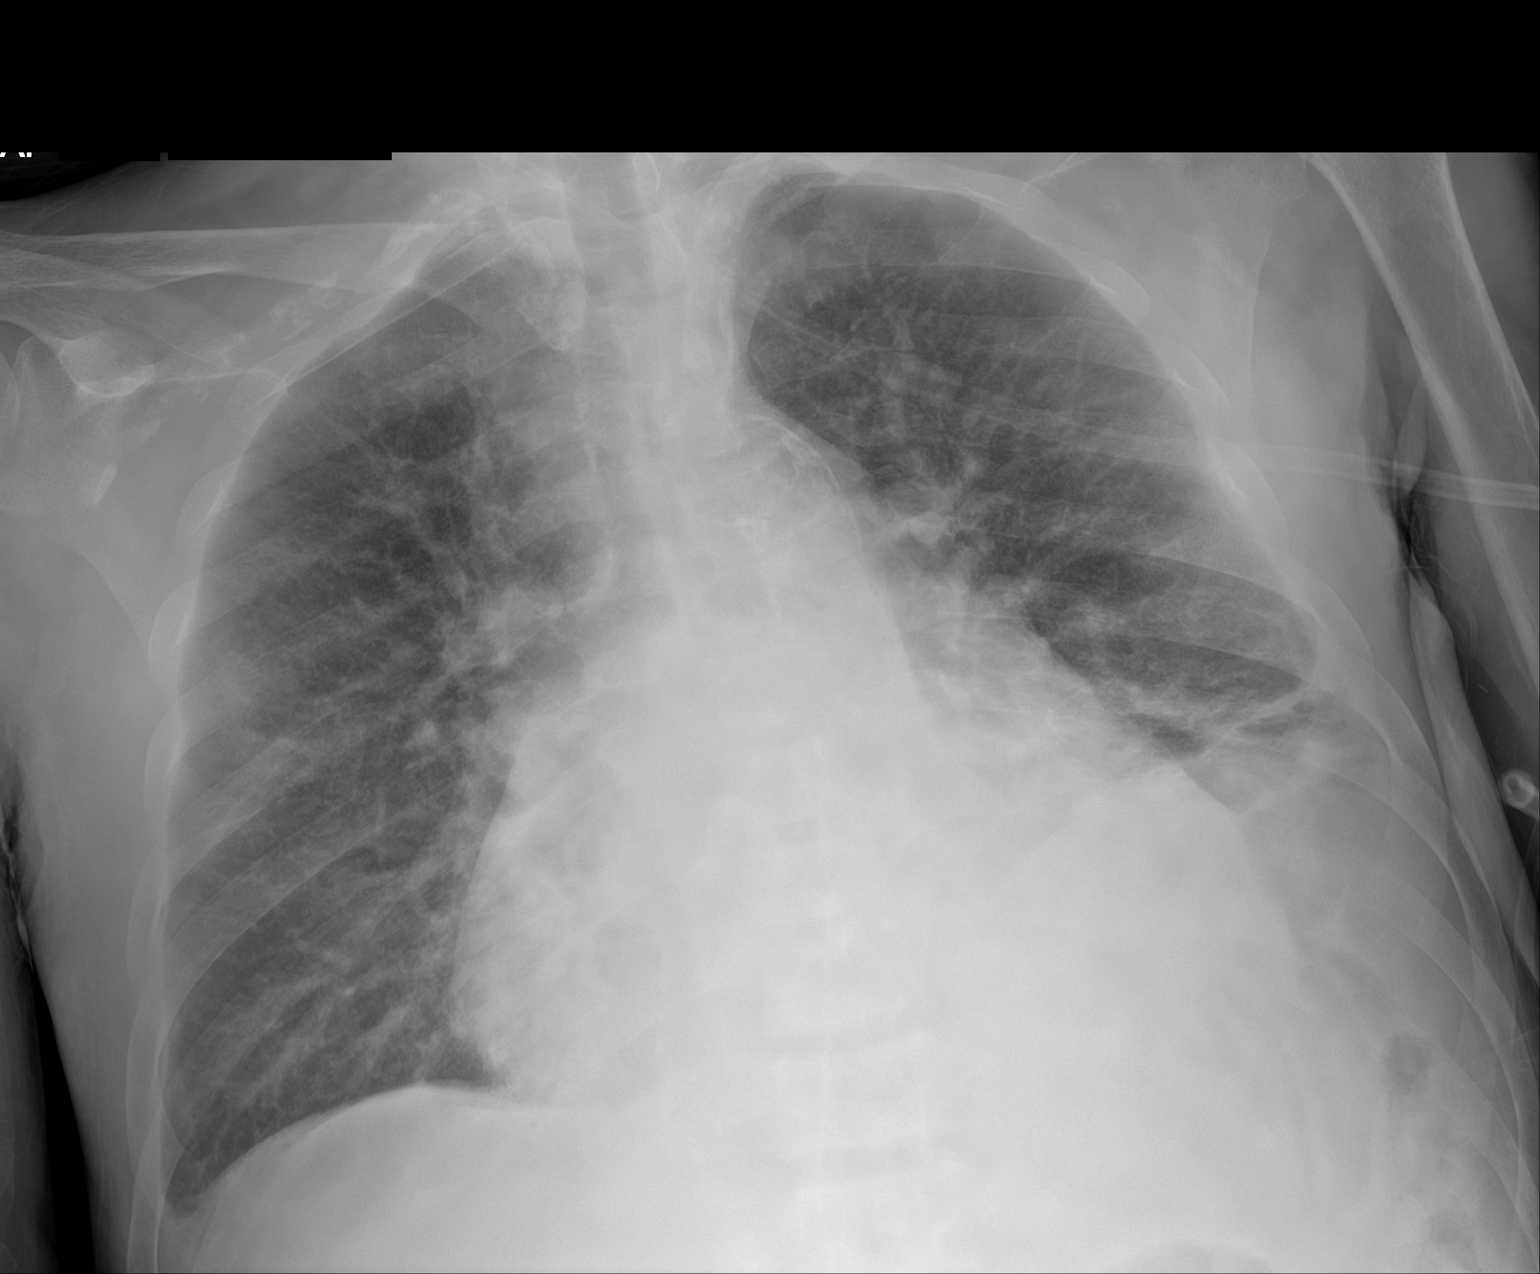

[1 of 1 positions shown; findings below may reference images not displayed]

FINDINGS: Cardiomegaly. Extensive atheromatous calcified plaque. Low volume
chest with streaky density asymmetric to the left base. Vascular
congestion. Small pleural effusions.
IMPRESSION: 1. Cardiomegaly and vascular congestion with small pleural
effusions.
2. Atelectasis.  No significant change compared to 2 days ago.

## 2022-09-18 IMAGING — DX DG PORTABLE PELVIS
1 series · 1 of 1 positions shown · non-contrast
Comparison: None.

CLINICAL DATA: Fall.  Unresponsive.

EXAM:
PORTABLE PELVIS 1-2 VIEWS

[pelvis ap]
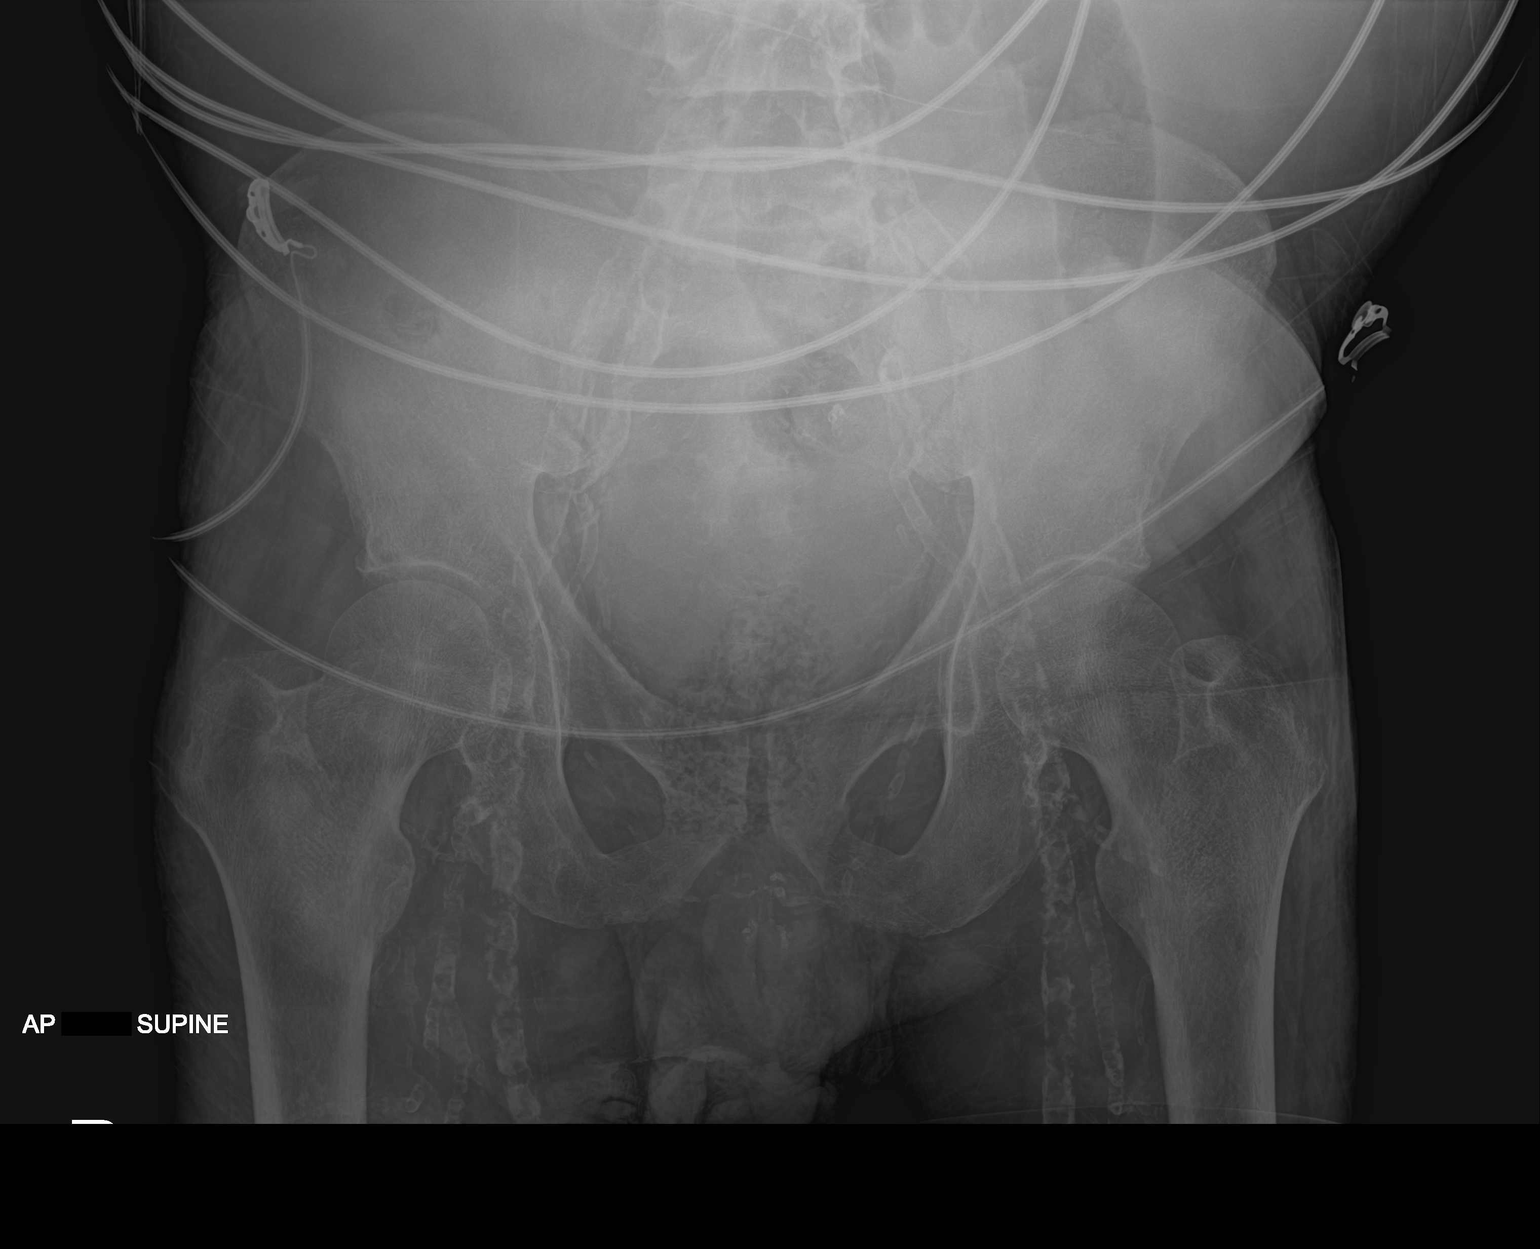

[1 of 1 positions shown; findings below may reference images not displayed]

FINDINGS: The cortical margins of the bony pelvis are intact. No fracture.
Pubic symphysis and sacroiliac joints are congruent. Both femoral
heads are well-seated in the respective acetabula. The bones are
diffusely under mineralized. Advanced vascular calcifications,
particularly for age.
IMPRESSION: 1. No pelvic fracture.
2. Advanced vascular calcifications.

## 2022-09-18 IMAGING — DX DG CHEST 1V PORT
1 series · 1 of 1 positions shown · non-contrast
Comparison: Chest x-ray 03/15/2021.

CLINICAL DATA: Fall, unresponsive.

EXAM:
PORTABLE CHEST 1 VIEW

[chest ap]
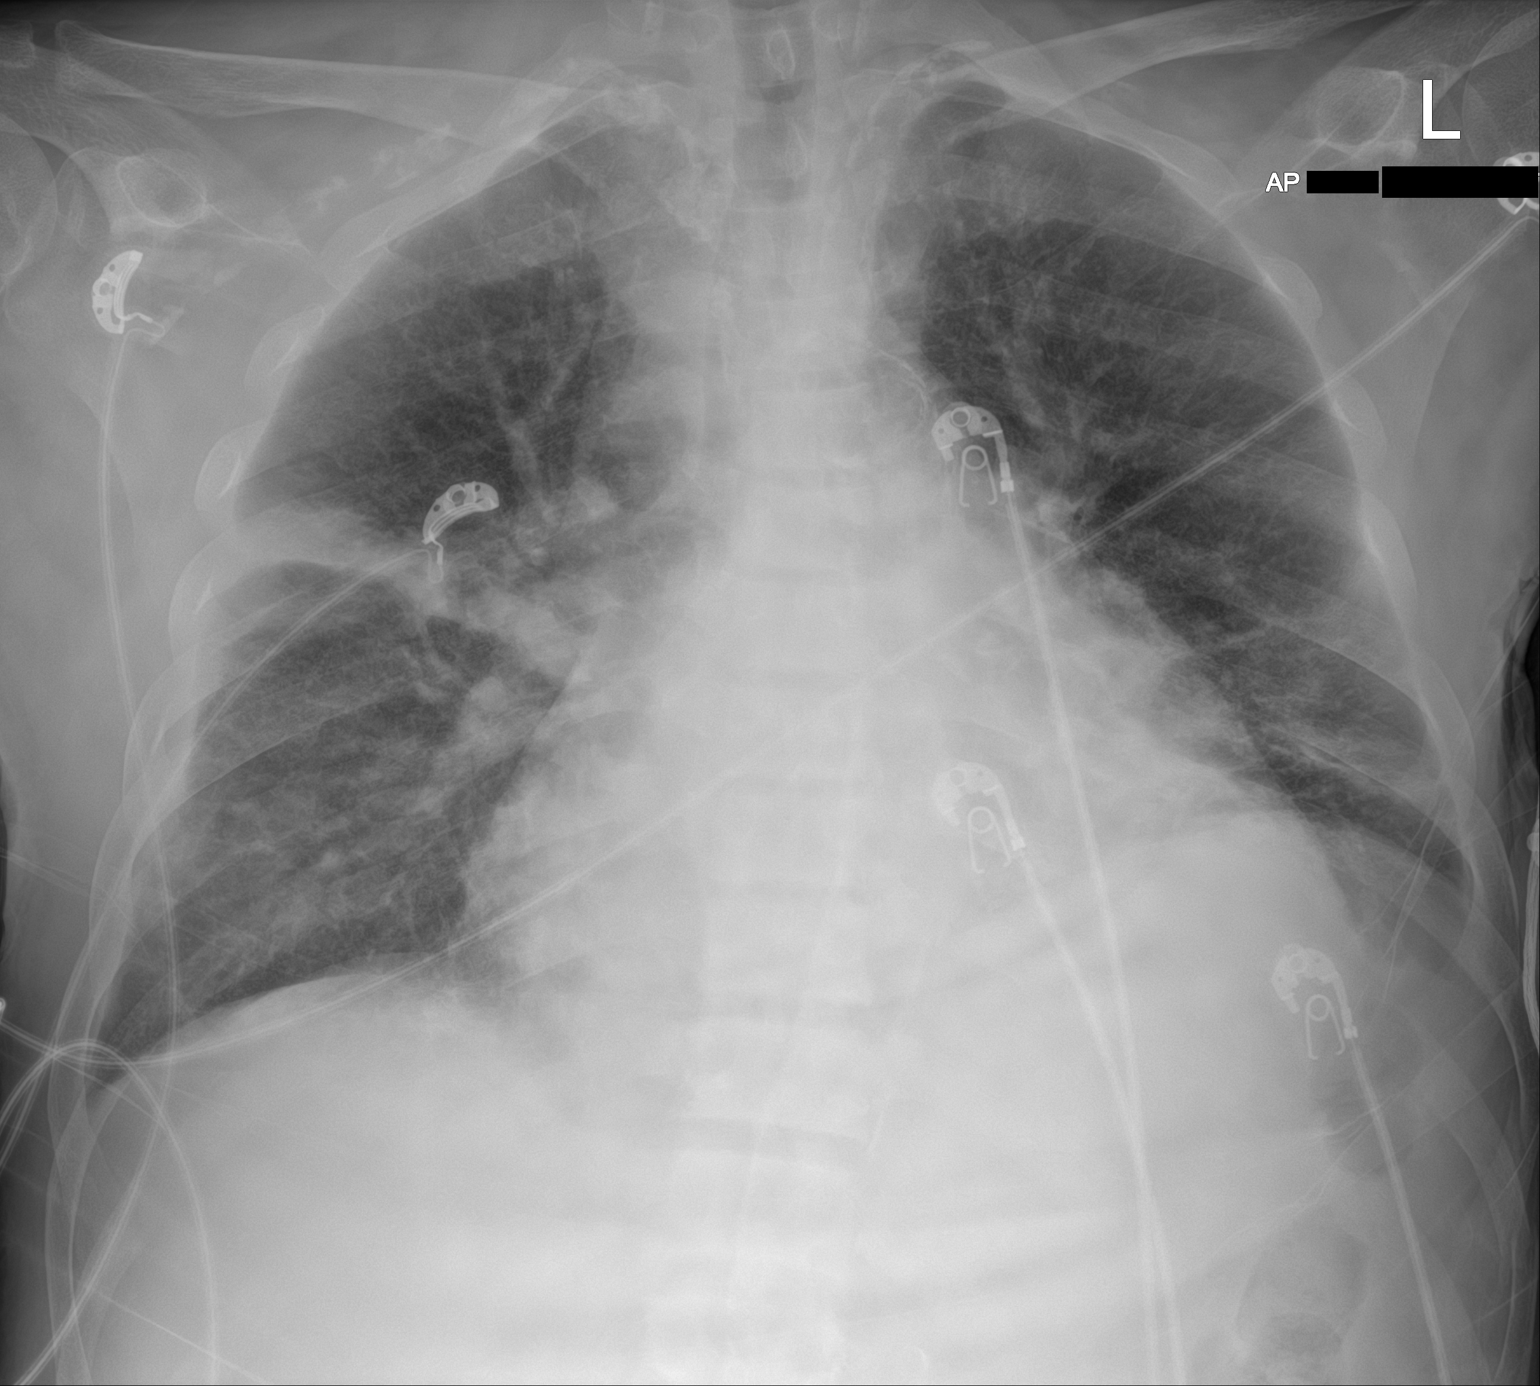

[1 of 1 positions shown; findings below may reference images not displayed]

FINDINGS: Cardiac silhouette is markedly enlarged, unchanged. There is central
pulmonary vascular congestion. Multifocal airspace opacities in the
mid and lower lungs bilaterally. Additionally, there is a band of
airspace opacity in the right mid lung. There is no pleural effusion
or pneumothorax identified. There are atherosclerotic calcifications
of the aorta and subclavian arteries. No acute fractures are seen.
IMPRESSION: 1. Cardiomegaly with central pulmonary vascular congestion.
2. Patchy bilateral multifocal airspace opacities worrisome for
infection, right greater than left.

## 2022-09-18 IMAGING — CT CT CERVICAL SPINE W/O CM
3 of 4 series · 10 of 33 positions shown, 12 images · non-contrast
Comparison: CT brain and cervical spine 03/13/2021

CLINICAL DATA: Altered mental status

EXAM:
CT HEAD WITHOUT CONTRAST
CT MAXILLOFACIAL WITHOUT CONTRAST
CT CERVICAL SPINE WITHOUT CONTRAST
TECHNIQUE: Multidetector CT imaging of the head, cervical spine, and
maxillofacial structures were performed using the standard protocol
without intravenous contrast. Multiplanar CT image reconstructions
of the cervical spine and maxillofacial structures were also
generated.

[Series 7: c_spine 2.0 st · axial · 0.37mm/px · z∈[+952,+1016]mm · 2 of 96 slices shown, 3 images]
[im 32/96  soft-tissue]
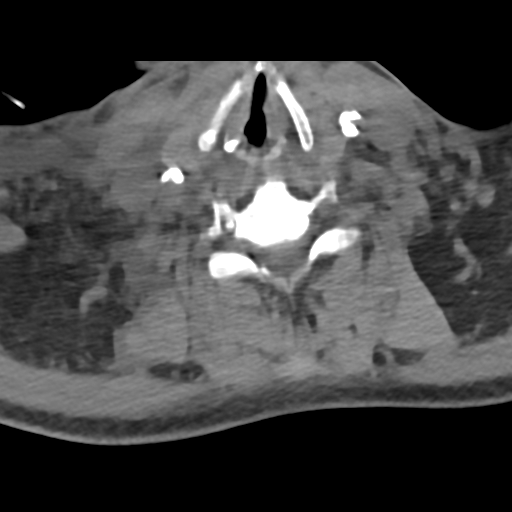
[im 32/96  bone]
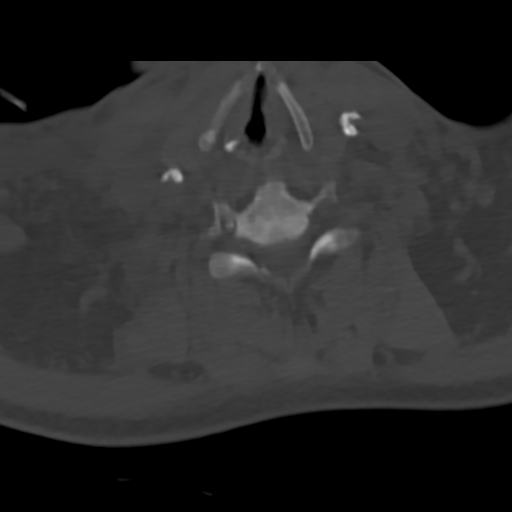
[im 64/96  bone]
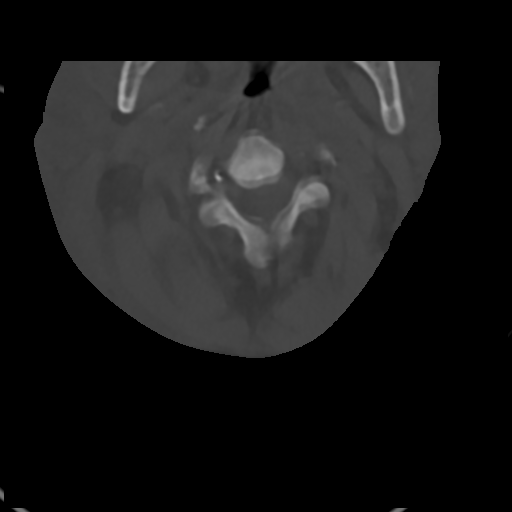

[Series 9: c_spine 2.0 sag bone · sagittal · 0.24mm/px · 5 of 61 slices shown, 6 images]
[im 21/61  bone]
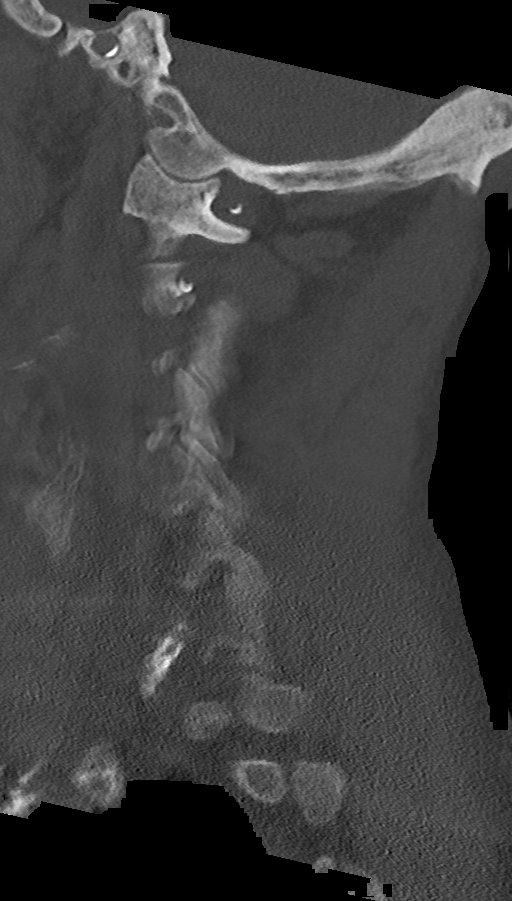
[im 26/61  bone]
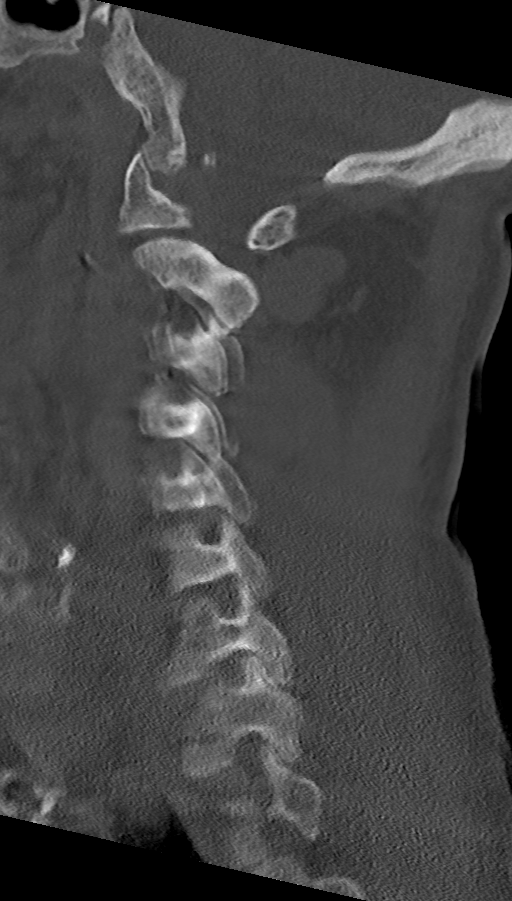
[im 31/61  soft-tissue]
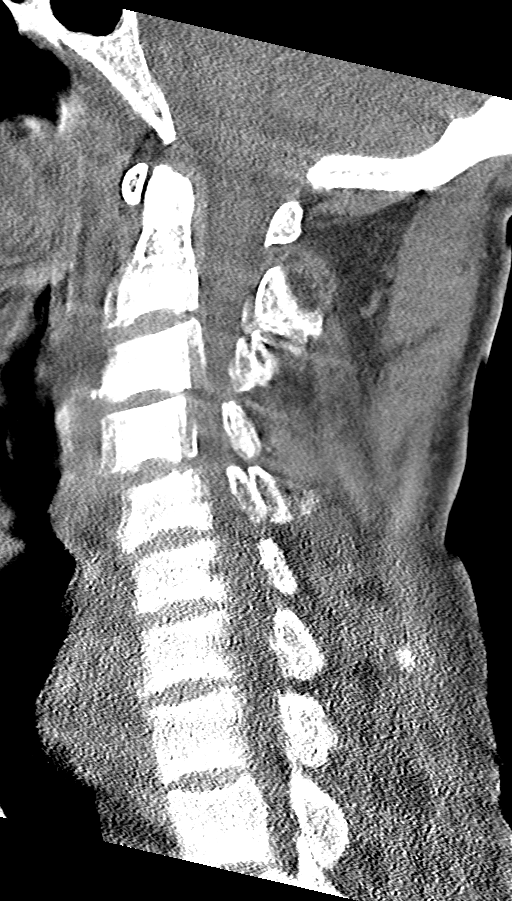
[im 31/61  bone]
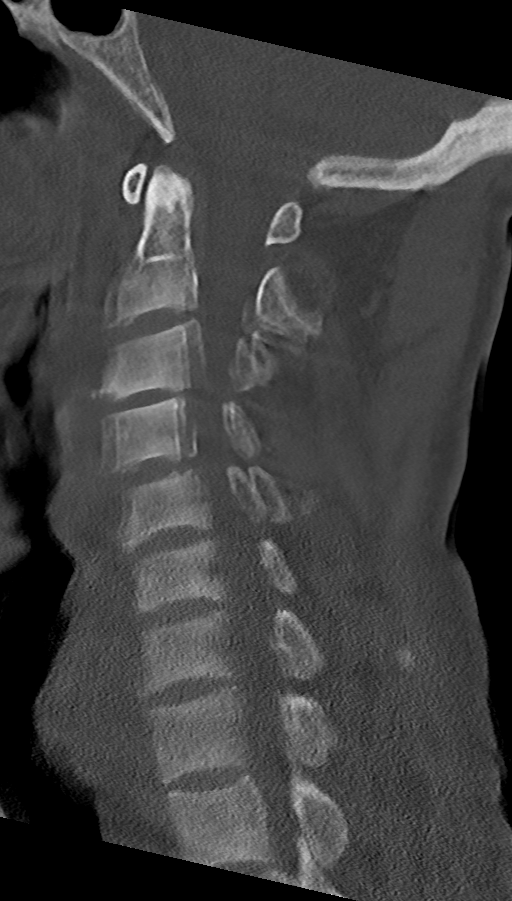
[im 36/61  bone]
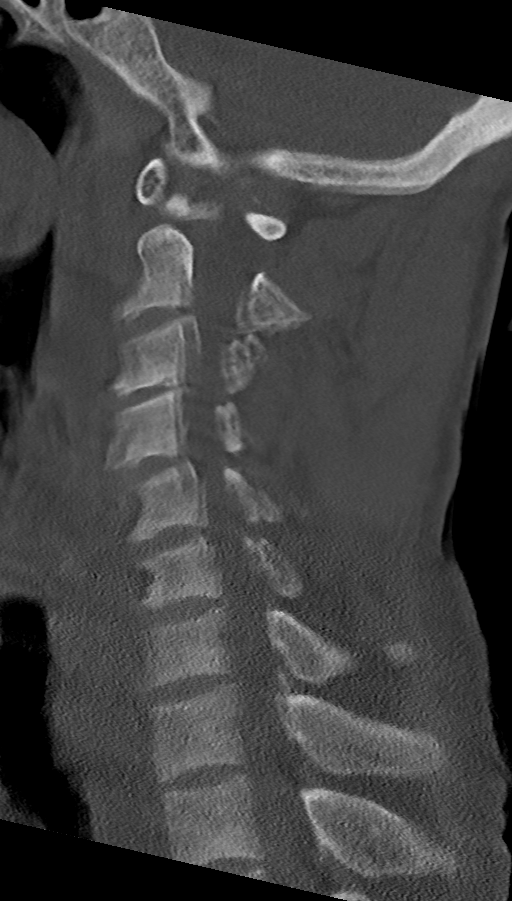
[im 41/61  bone]
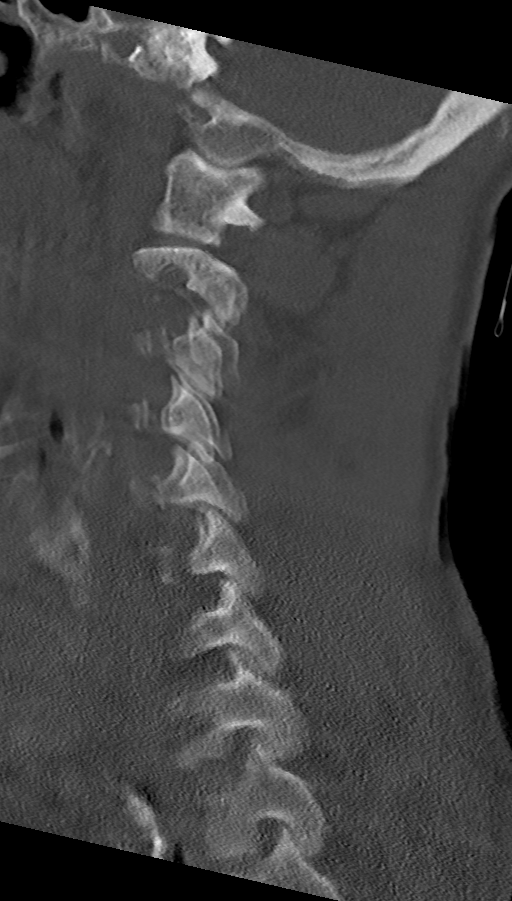

[Series 10: c_spine 2.0 cor bone · coronal · 0.28mm/px · 3 of 61 slices shown]
[im 13/61  bone]
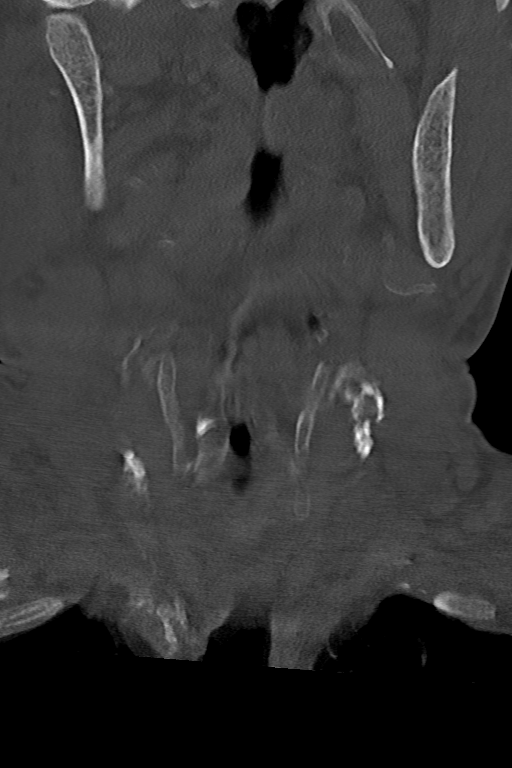
[im 25/61  bone]
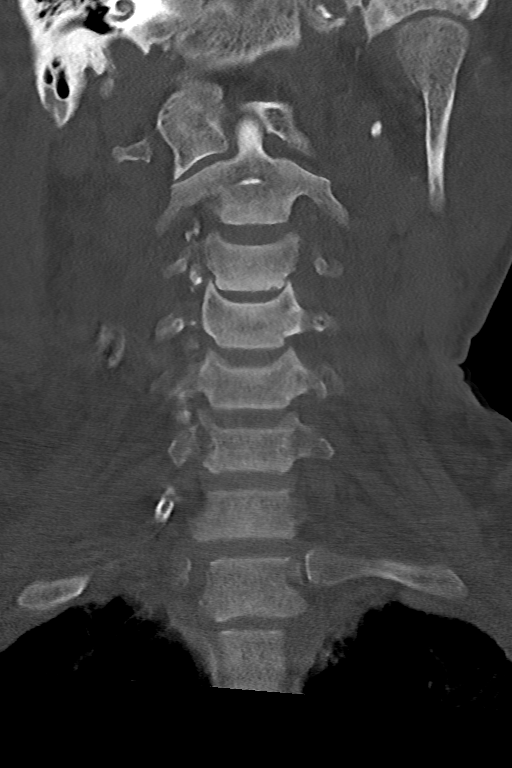
[im 37/61  bone]
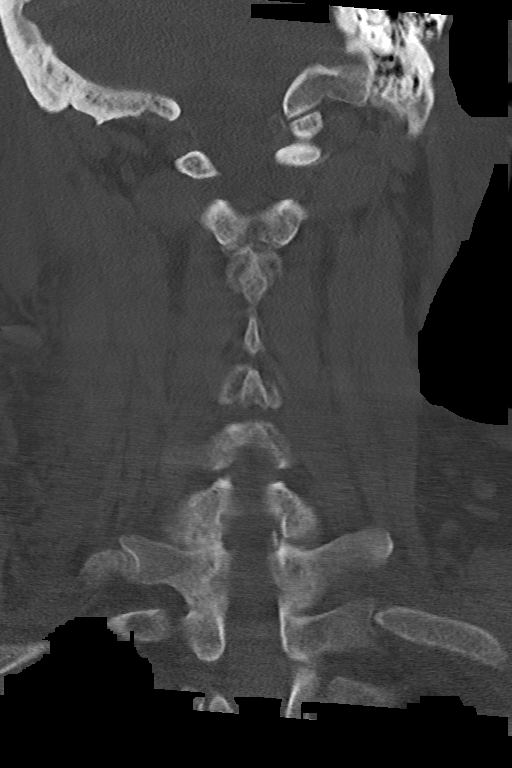

[10 of 33 positions shown; findings below may reference images not displayed]

FINDINGS: CT HEAD FINDINGS

Brain: No acute territorial infarction, hemorrhage, or intracranial
mass. Mild atrophy and chronic small vessel ischemic changes of the
white matter. Stable ventricle size. Small chronic left cerebellar
infarcts. Small chronic right posterior temporal infarct. Chronic
lacunar infarcts within the thalamus and basal ganglia.

Vascular: No hyperdense vessels. Vertebral and carotid vascular
calcification

Skull: None

Other: Prominent scalp soft tissue swelling anteriorly, over the
right parietal region and the bilateral occiput.

CT MAXILLOFACIAL FINDINGS

Osseous: Motion degradation limits assessment. Mastoid air cells
grossly clear. Mandibular heads are normally position. No mandibular
fracture. Pterygoid plates and zygomatic arches appear intact. No
acute nasal fracture

Orbits: Negative. No traumatic or inflammatory finding.

Sinuses: Fluid levels in the maxillary sinuses without definitive
sinus wall fracture

Soft tissues: Extensive right greater than left facial edema with
edema at the base of the right neck. Marked periorbital and forehead
soft tissue swelling.

CT CERVICAL SPINE FINDINGS

Alignment: Considerable motion degradation which limits assessment
for fracture. Straightening cervical spine. Facet alignment grossly
maintained

Skull base and vertebrae: No gross fracture seen

Soft tissues and spinal canal: No prevertebral fluid or swelling. No
visible canal hematoma.

Disc levels:  Disc space narrowing C3-C4.

Upper chest: Lung apices clear

Other: Marked edema within the subcutaneous soft tissues of the
right sub occipital region and right greater than left neck.
IMPRESSION: 1. Limited by motion degradation
2. No definite CT evidence for acute intracranial abnormality. Mild
atrophy and chronic small vessel ischemic change of the white
matter. Multiple small chronic infarcts
3. No definite acute facial bone fracture identified. Bilateral
maxillary sinus fluid levels without definitive fracture
4. Considerable motion degradation of the cervical spine. No gross
fracture seen
5. Extensive edema within the scalp soft tissues and right greater
than left neck.

## 2022-09-18 IMAGING — CT CT HEAD W/O CM
4 series · 15 of 47 positions shown, 17 images · non-contrast
Comparison: CT brain and cervical spine 03/13/2021

CLINICAL DATA: Altered mental status

EXAM:
CT HEAD WITHOUT CONTRAST
CT MAXILLOFACIAL WITHOUT CONTRAST
CT CERVICAL SPINE WITHOUT CONTRAST
TECHNIQUE: Multidetector CT imaging of the head, cervical spine, and
maxillofacial structures were performed using the standard protocol
without intravenous contrast. Multiplanar CT image reconstructions
of the cervical spine and maxillofacial structures were also
generated.

[Series 3: head without · axial · non-contrast · 0.46mm/px · z∈[+1080,+1200]mm · 7 of 33 slices shown, 9 images]
[im 5/33  brain]
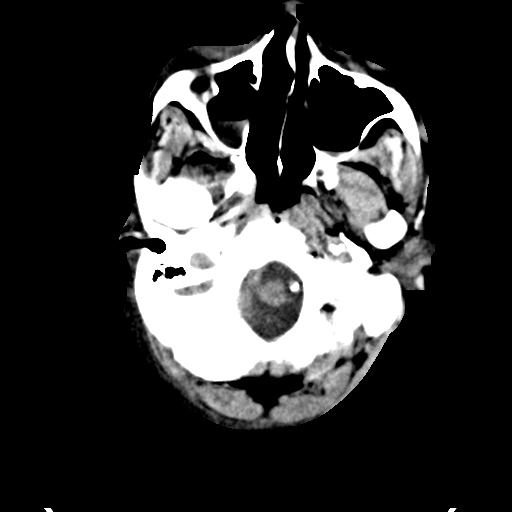
[im 5/33  bone]
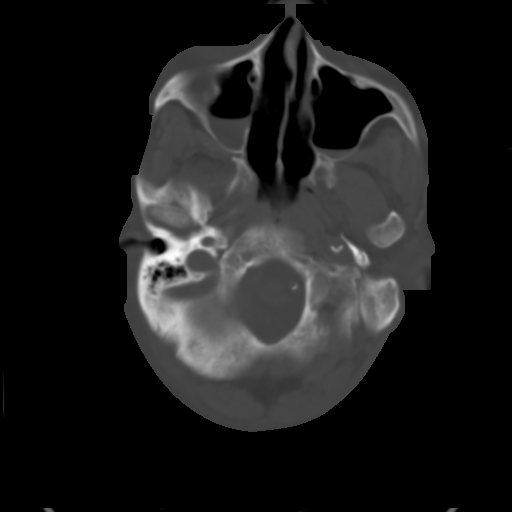
[im 9/33  brain]
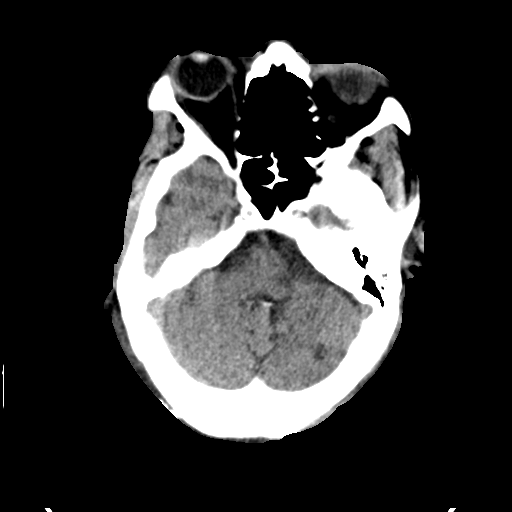
[im 13/33  brain]
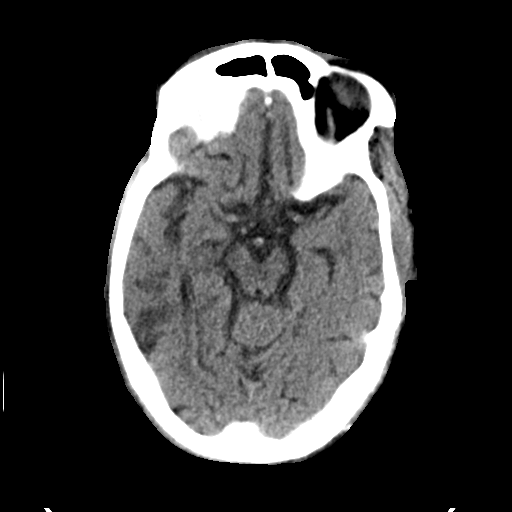
[im 17/33  brain]
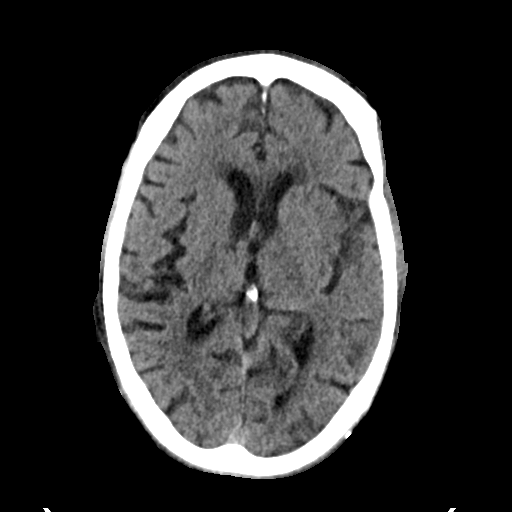
[im 21/33  brain]
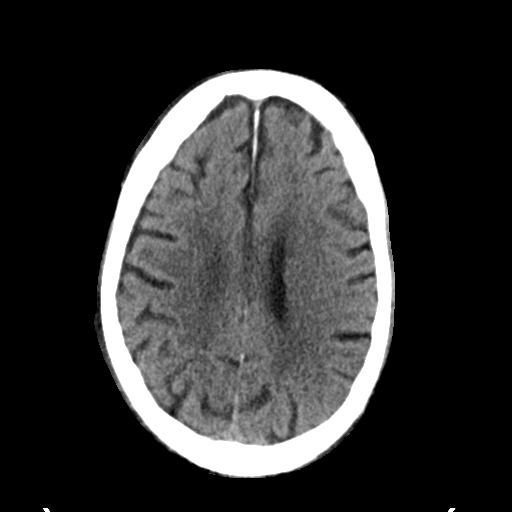
[im 21/33  bone]
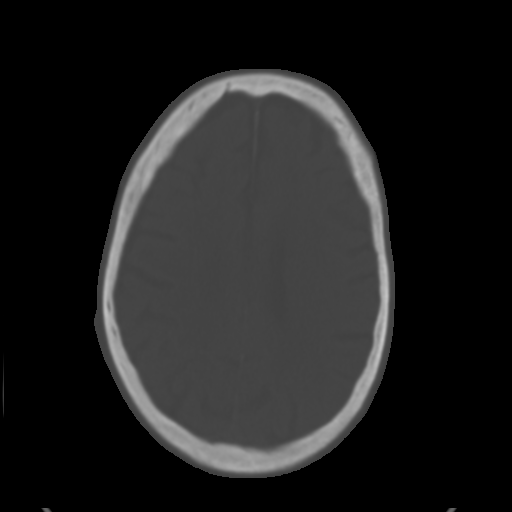
[im 25/33  brain]
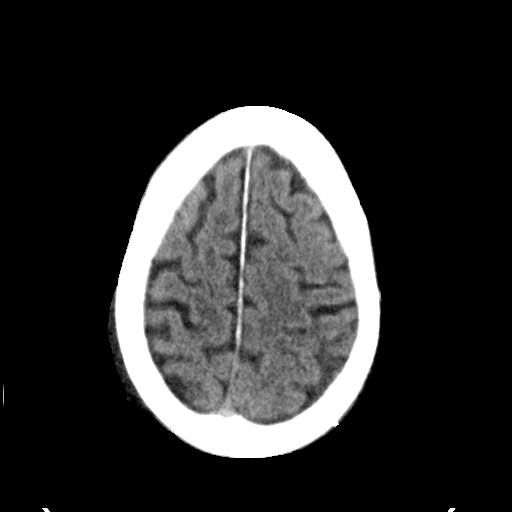
[im 29/33  brain]
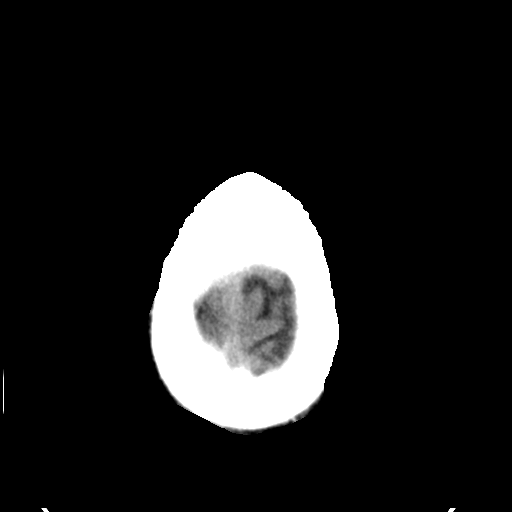

[Series 4: head bone · axial · 0.46mm/px · z∈[+1076,+1092]mm · 2 of 82 slices shown]
[im 9/82  bone]
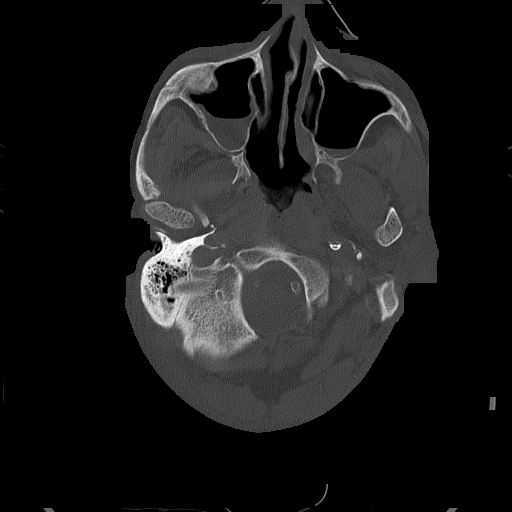
[im 17/82  bone]
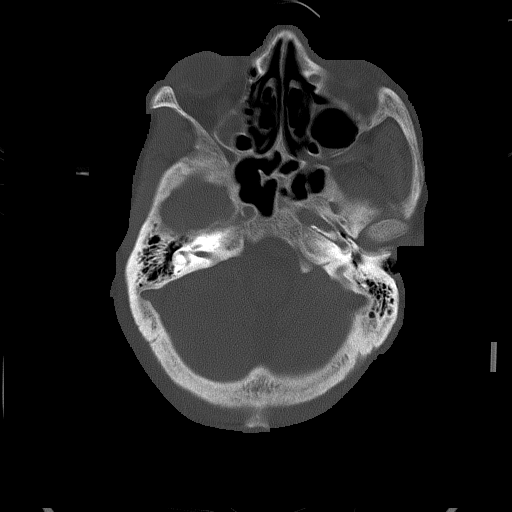

[Series 5: head without cor · coronal · non-contrast · 0.31mm/px · 3 of 74 slices shown]
[im 25/74  brain]
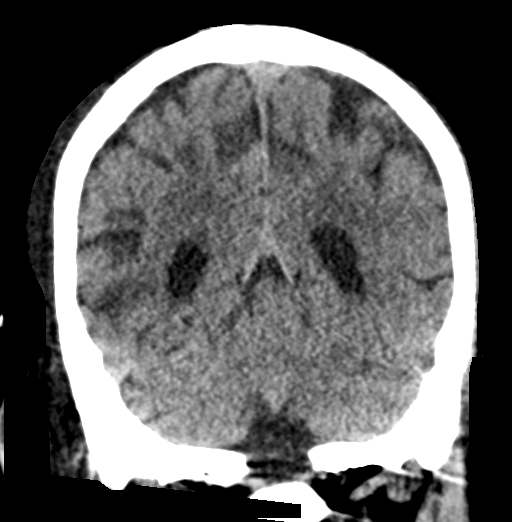
[im 33/74  brain]
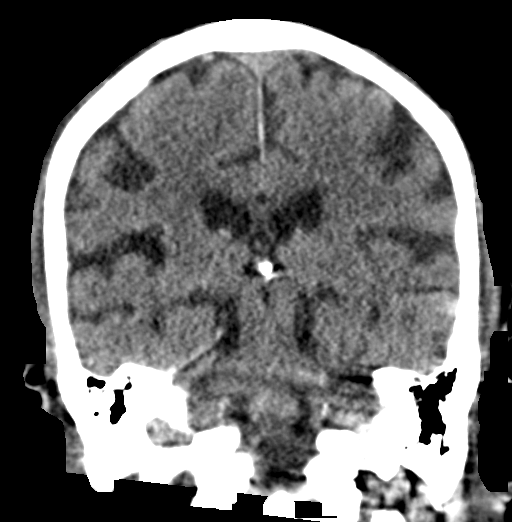
[im 41/74  brain]
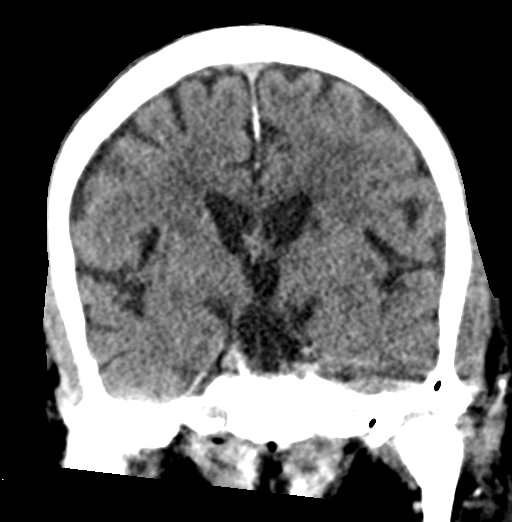

[Series 6: head without sag · sagittal · non-contrast · 0.31mm/px · 3 of 56 slices shown]
[im 19/56  brain]
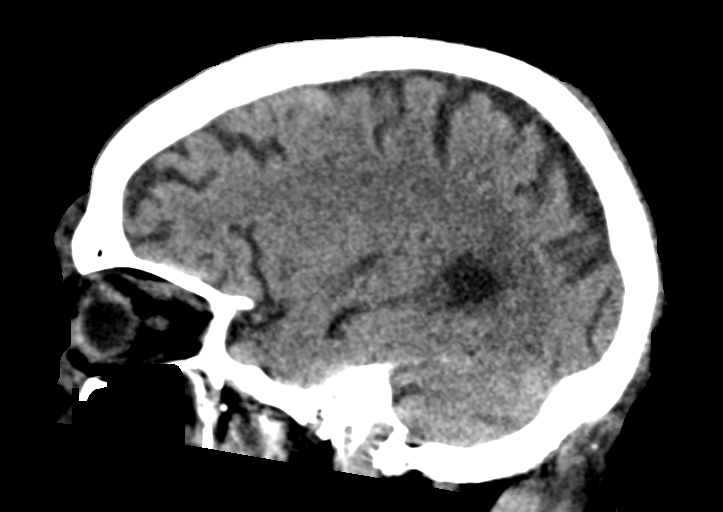
[im 28/56  brain]
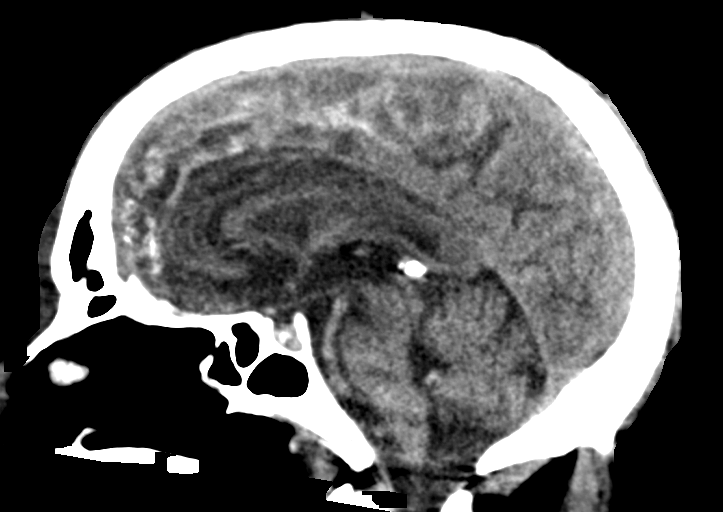
[im 37/56  brain]
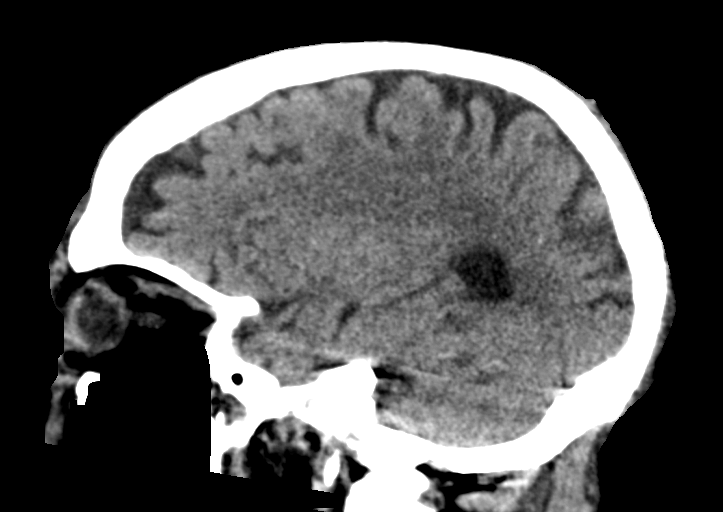

[15 of 47 positions shown; findings below may reference images not displayed]

FINDINGS: CT HEAD FINDINGS

Brain: No acute territorial infarction, hemorrhage, or intracranial
mass. Mild atrophy and chronic small vessel ischemic changes of the
white matter. Stable ventricle size. Small chronic left cerebellar
infarcts. Small chronic right posterior temporal infarct. Chronic
lacunar infarcts within the thalamus and basal ganglia.

Vascular: No hyperdense vessels. Vertebral and carotid vascular
calcification

Skull: None

Other: Prominent scalp soft tissue swelling anteriorly, over the
right parietal region and the bilateral occiput.

CT MAXILLOFACIAL FINDINGS

Osseous: Motion degradation limits assessment. Mastoid air cells
grossly clear. Mandibular heads are normally position. No mandibular
fracture. Pterygoid plates and zygomatic arches appear intact. No
acute nasal fracture

Orbits: Negative. No traumatic or inflammatory finding.

Sinuses: Fluid levels in the maxillary sinuses without definitive
sinus wall fracture

Soft tissues: Extensive right greater than left facial edema with
edema at the base of the right neck. Marked periorbital and forehead
soft tissue swelling.

CT CERVICAL SPINE FINDINGS

Alignment: Considerable motion degradation which limits assessment
for fracture. Straightening cervical spine. Facet alignment grossly
maintained

Skull base and vertebrae: No gross fracture seen

Soft tissues and spinal canal: No prevertebral fluid or swelling. No
visible canal hematoma.

Disc levels:  Disc space narrowing C3-C4.

Upper chest: Lung apices clear

Other: Marked edema within the subcutaneous soft tissues of the
right sub occipital region and right greater than left neck.
IMPRESSION: 1. Limited by motion degradation
2. No definite CT evidence for acute intracranial abnormality. Mild
atrophy and chronic small vessel ischemic change of the white
matter. Multiple small chronic infarcts
3. No definite acute facial bone fracture identified. Bilateral
maxillary sinus fluid levels without definitive fracture
4. Considerable motion degradation of the cervical spine. No gross
fracture seen
5. Extensive edema within the scalp soft tissues and right greater
than left neck.

## 2022-09-18 IMAGING — CT CT MAXILLOFACIAL W/O CM
3 series · 15 of 47 positions shown, 18 images · non-contrast
Comparison: CT brain and cervical spine 03/13/2021

CLINICAL DATA: Altered mental status

EXAM:
CT HEAD WITHOUT CONTRAST
CT MAXILLOFACIAL WITHOUT CONTRAST
CT CERVICAL SPINE WITHOUT CONTRAST
TECHNIQUE: Multidetector CT imaging of the head, cervical spine, and
maxillofacial structures were performed using the standard protocol
without intravenous contrast. Multiplanar CT image reconstructions
of the cervical spine and maxillofacial structures were also
generated.

[Series 3: facialbone 2.0 (person_name) (person_name) · axial · 0.40mm/px · z∈[+968,+1118]mm · 9 of 89 slices shown, 12 images]
[im 7/89  brain]
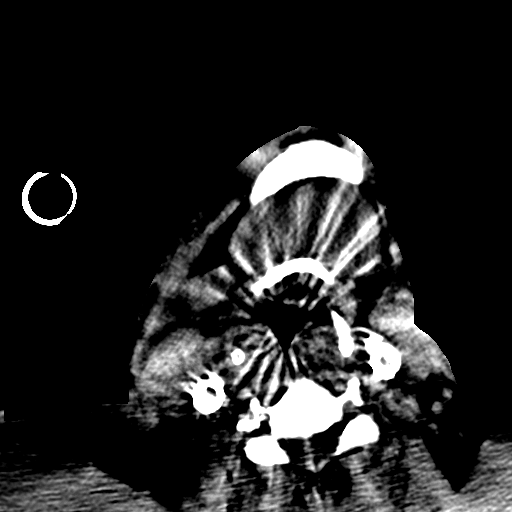
[im 7/89  bone]
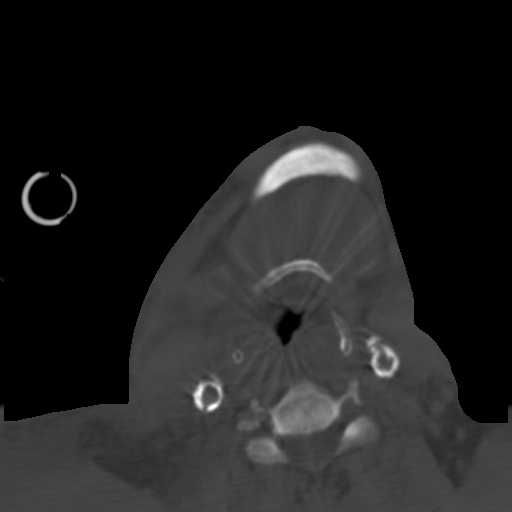
[im 16/89  bone]
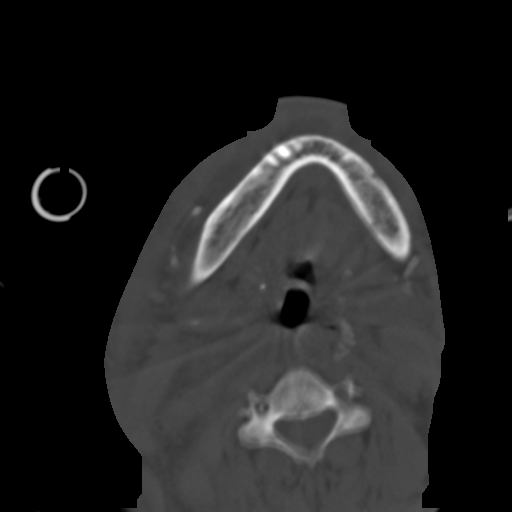
[im 25/89  bone]
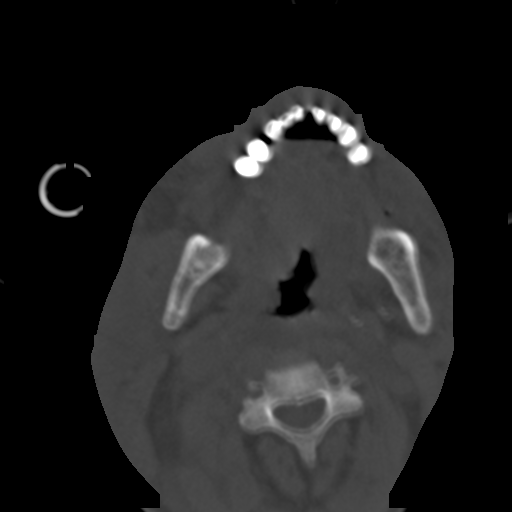
[im 34/89  bone]
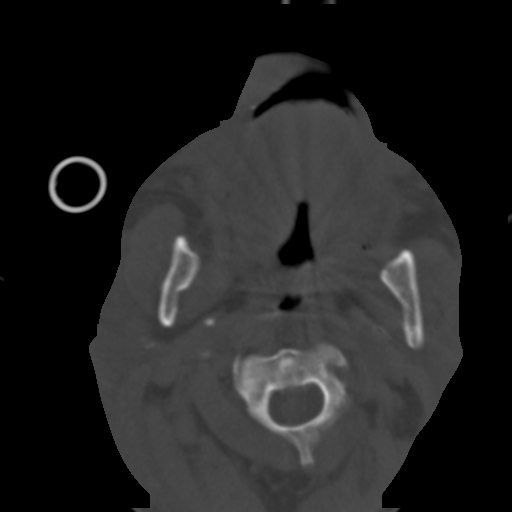
[im 46/89  brain]
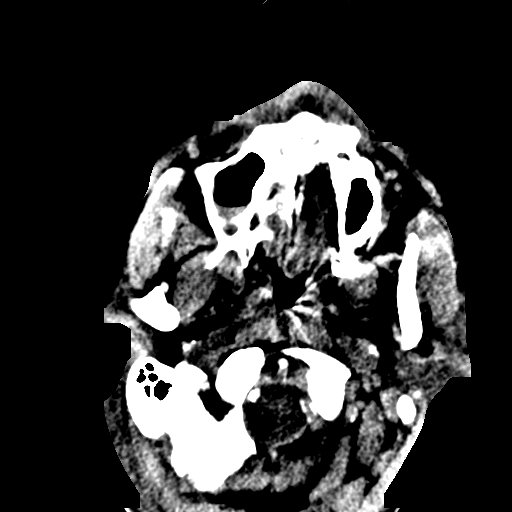
[im 46/89  bone]
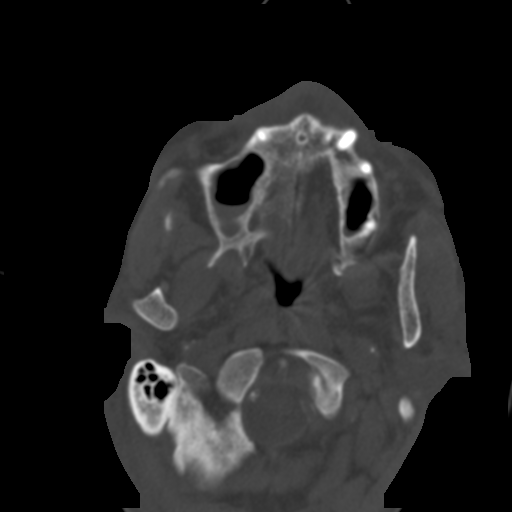
[im 55/89  bone]
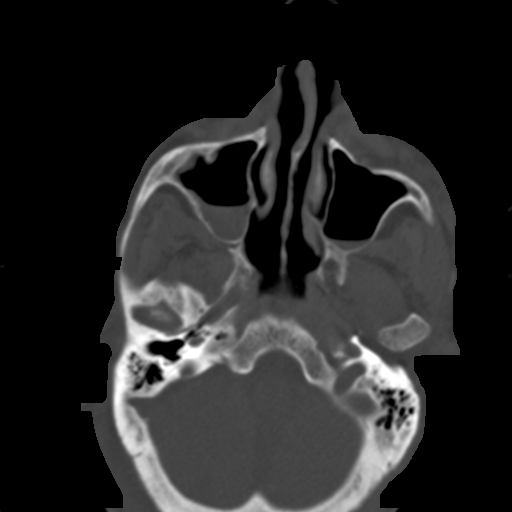
[im 64/89  bone]
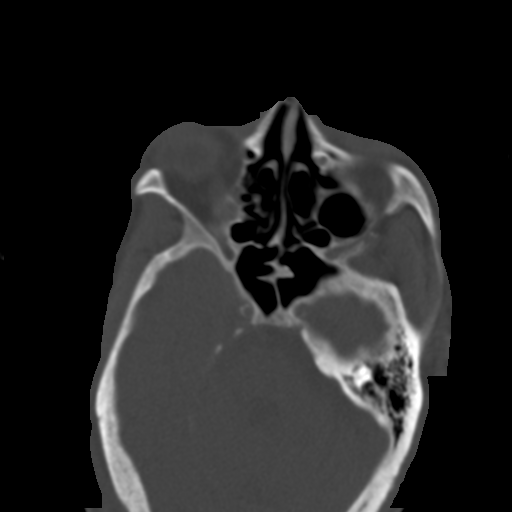
[im 73/89  bone]
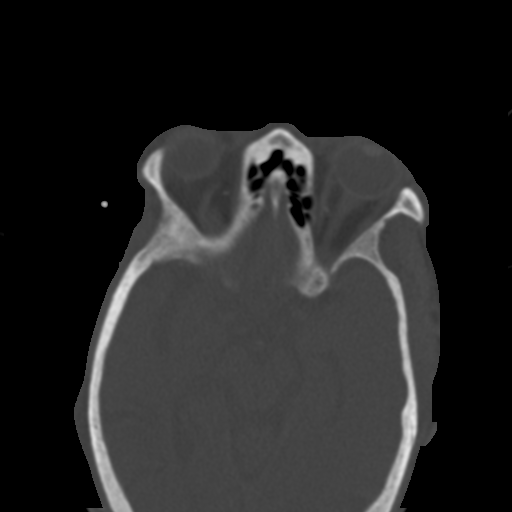
[im 82/89  brain]
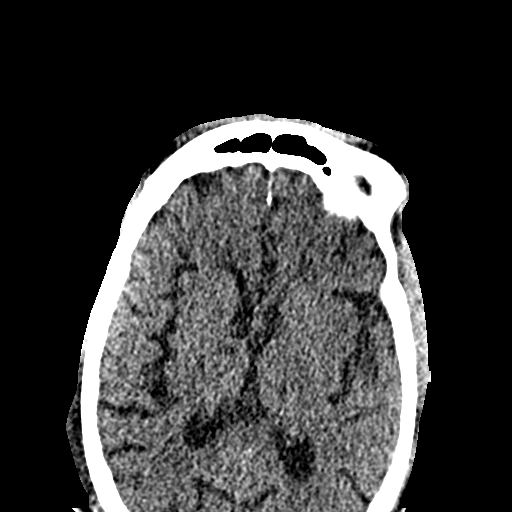
[im 82/89  bone]
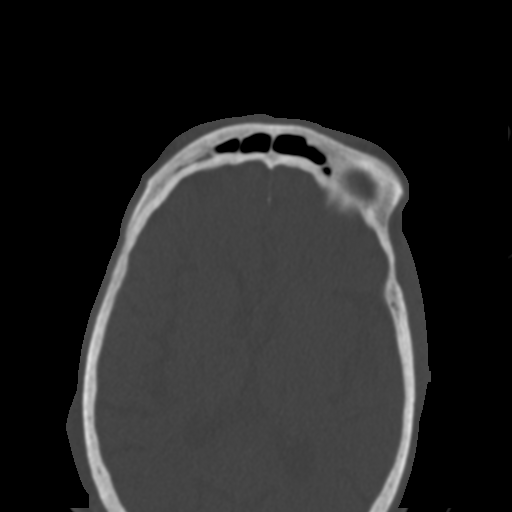

[Series 7: facialbone 2.0 cor st · coronal · 0.36mm/px · 3 of 97 slices shown]
[im 33/97  bone]
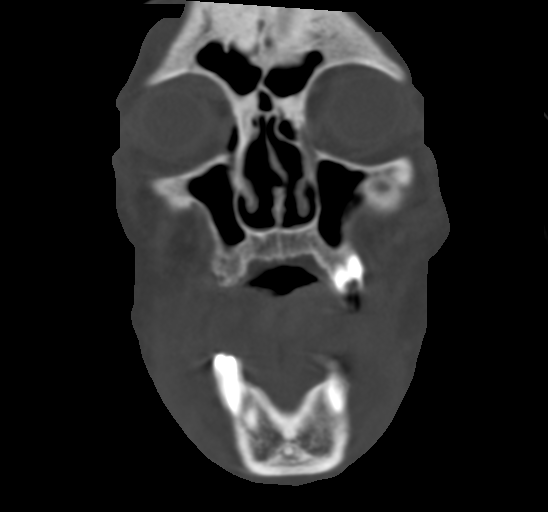
[im 43/97  bone]
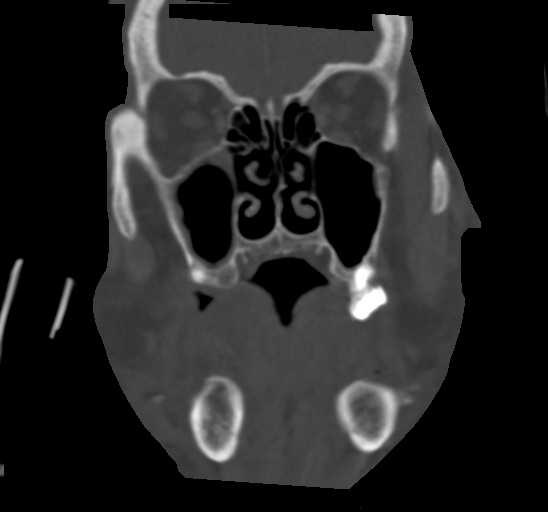
[im 54/97  bone]
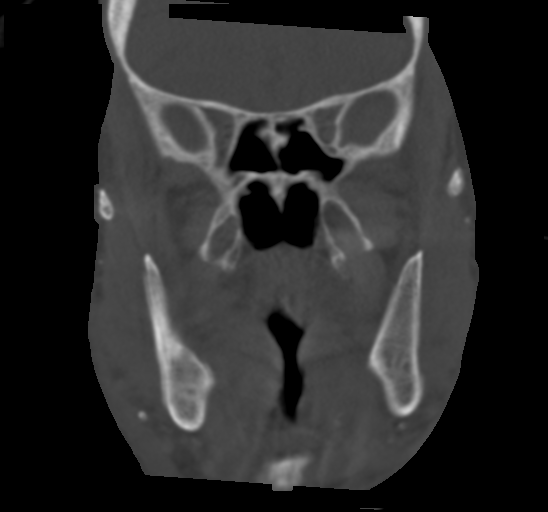

[Series 8: facialbone 2.0 sag st · sagittal · 0.36mm/px · 3 of 97 slices shown]
[im 33/97  bone]
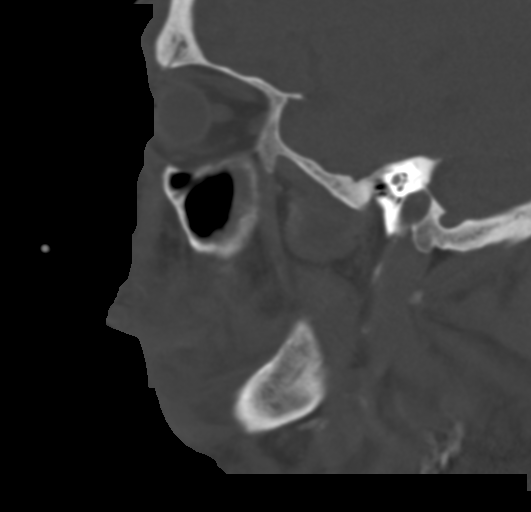
[im 49/97  bone]
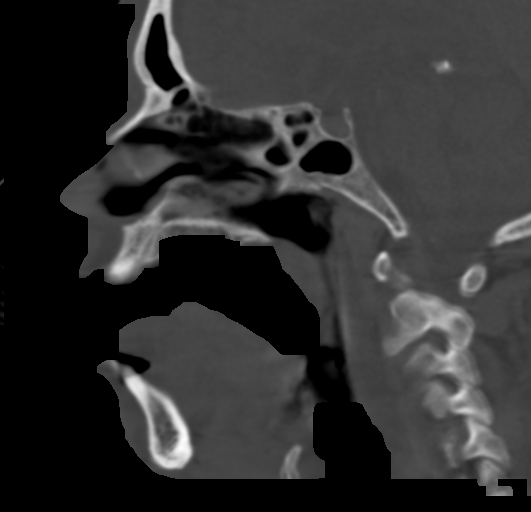
[im 65/97  bone]
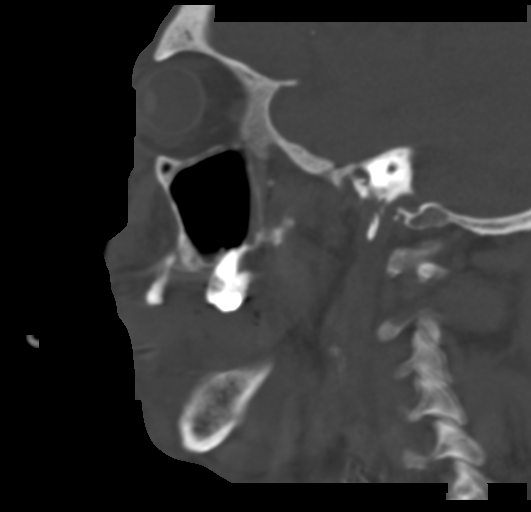

[15 of 47 positions shown; findings below may reference images not displayed]

FINDINGS: CT HEAD FINDINGS

Brain: No acute territorial infarction, hemorrhage, or intracranial
mass. Mild atrophy and chronic small vessel ischemic changes of the
white matter. Stable ventricle size. Small chronic left cerebellar
infarcts. Small chronic right posterior temporal infarct. Chronic
lacunar infarcts within the thalamus and basal ganglia.

Vascular: No hyperdense vessels. Vertebral and carotid vascular
calcification

Skull: None

Other: Prominent scalp soft tissue swelling anteriorly, over the
right parietal region and the bilateral occiput.

CT MAXILLOFACIAL FINDINGS

Osseous: Motion degradation limits assessment. Mastoid air cells
grossly clear. Mandibular heads are normally position. No mandibular
fracture. Pterygoid plates and zygomatic arches appear intact. No
acute nasal fracture

Orbits: Negative. No traumatic or inflammatory finding.

Sinuses: Fluid levels in the maxillary sinuses without definitive
sinus wall fracture

Soft tissues: Extensive right greater than left facial edema with
edema at the base of the right neck. Marked periorbital and forehead
soft tissue swelling.

CT CERVICAL SPINE FINDINGS

Alignment: Considerable motion degradation which limits assessment
for fracture. Straightening cervical spine. Facet alignment grossly
maintained

Skull base and vertebrae: No gross fracture seen

Soft tissues and spinal canal: No prevertebral fluid or swelling. No
visible canal hematoma.

Disc levels:  Disc space narrowing C3-C4.

Upper chest: Lung apices clear

Other: Marked edema within the subcutaneous soft tissues of the
right sub occipital region and right greater than left neck.
IMPRESSION: 1. Limited by motion degradation
2. No definite CT evidence for acute intracranial abnormality. Mild
atrophy and chronic small vessel ischemic change of the white
matter. Multiple small chronic infarcts
3. No definite acute facial bone fracture identified. Bilateral
maxillary sinus fluid levels without definitive fracture
4. Considerable motion degradation of the cervical spine. No gross
fracture seen
5. Extensive edema within the scalp soft tissues and right greater
than left neck.
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
## 2022-09-19 IMAGING — CT CT ANGIO CHEST
2 of 7 series · 18 of 46 positions shown · IV contrast (omnipaque)
Comparison: 03/13/2021

CLINICAL DATA: PE suspected, low/intermediate prob, positive
D-dimer

EXAM:
CT ANGIOGRAPHY CHEST WITH CONTRAST
TECHNIQUE: Multidetector CT imaging of the chest was performed using the
standard protocol during bolus administration of intravenous
contrast. Multiplanar CT image reconstructions and MIPs were
obtained to evaluate the vascular anatomy.
CONTRAST:  75mL OMNIPAQUE IOHEXOL 350 MG/ML SOLN

[Series 7: thins · axial · 0.89mm/px · z∈[+1252,+1536]mm · 15 of 457 slices shown]
[im 26/457  lung]
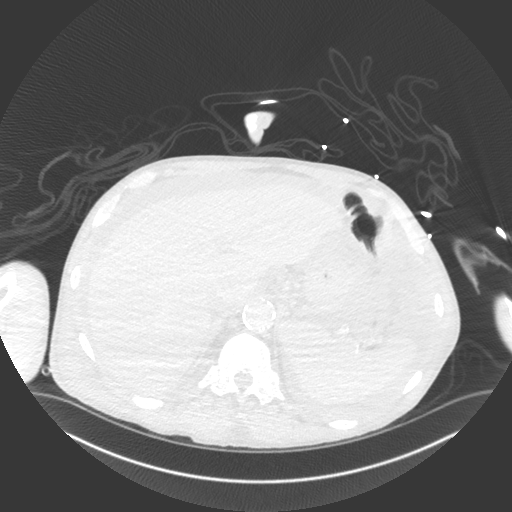
[im 51/457  soft-tissue]
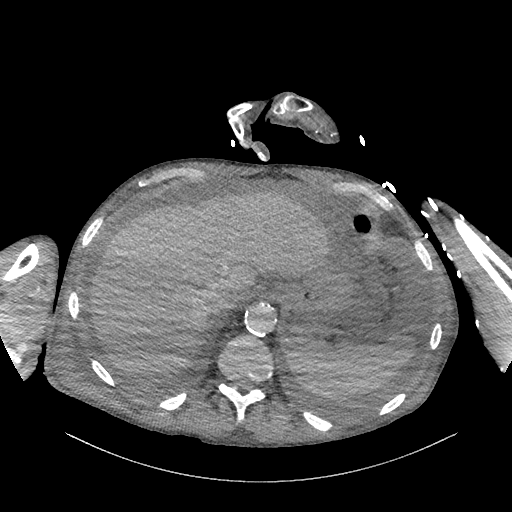
[im 77/457  lung]
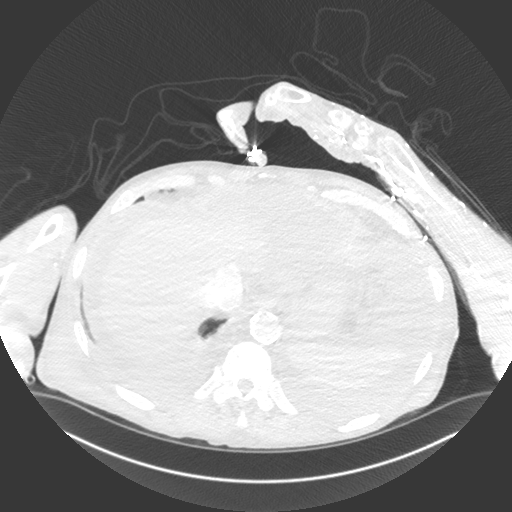
[im 102/457  soft-tissue]
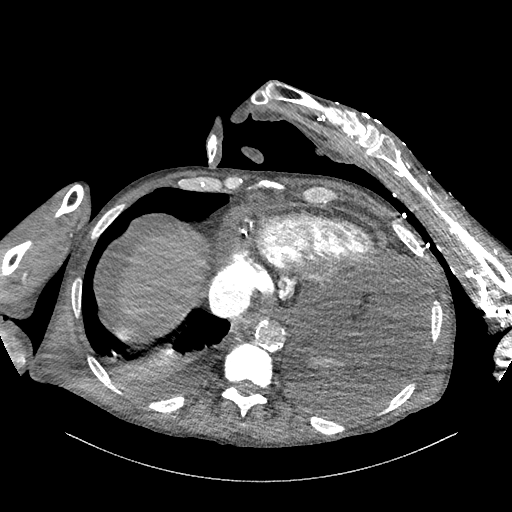
[im 153/457  lung]
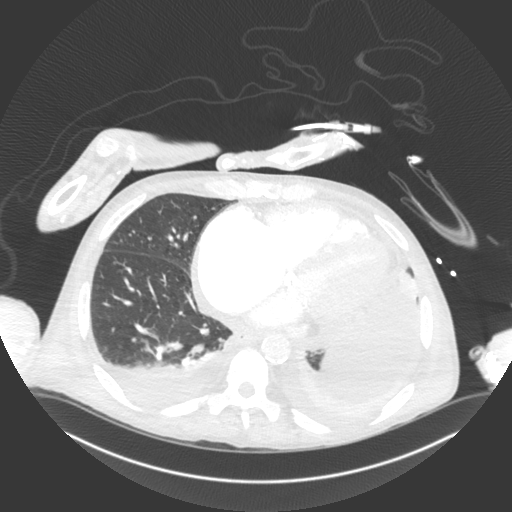
[im 178/457  soft-tissue]
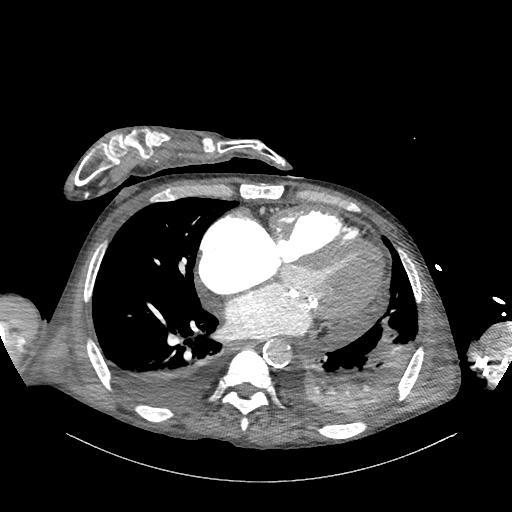
[im 203/457  lung]
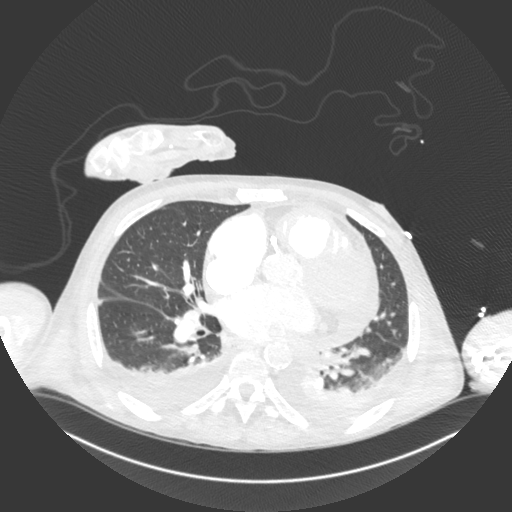
[im 229/457  soft-tissue]
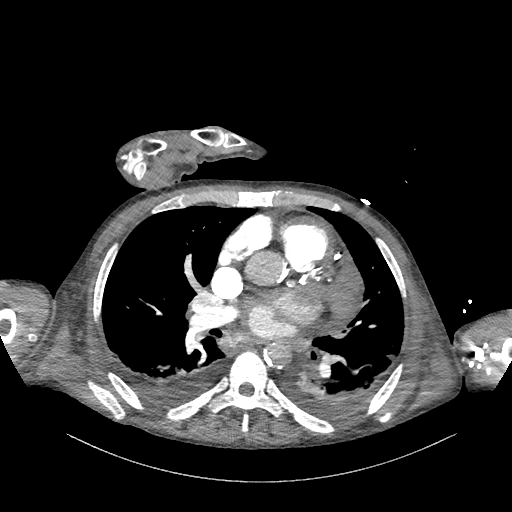
[im 254/457  lung]
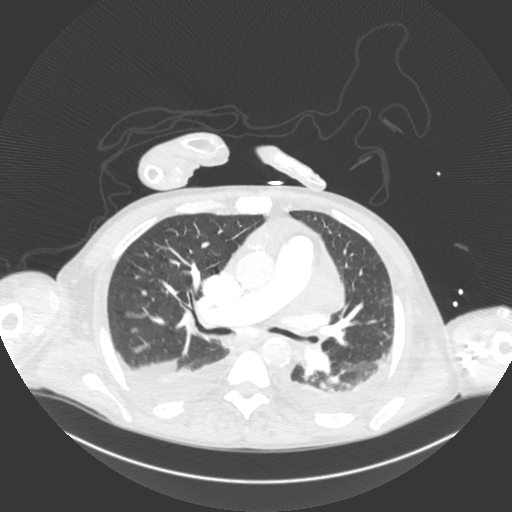
[im 279/457  soft-tissue]
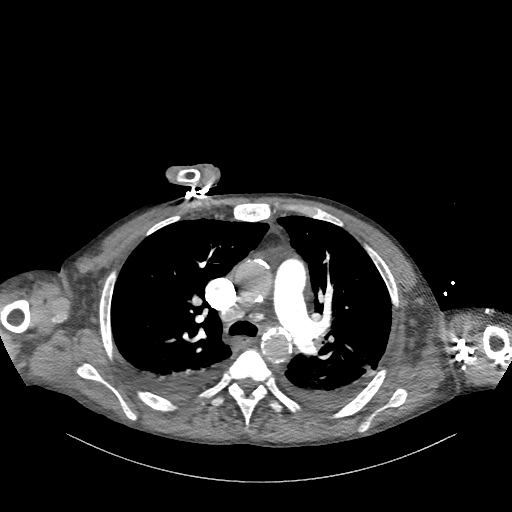
[im 305/457  lung]
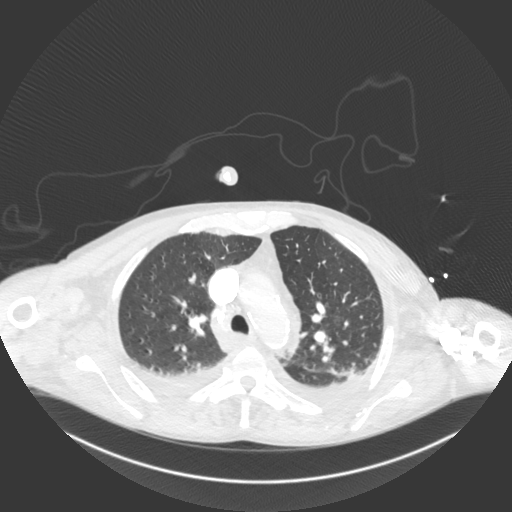
[im 355/457  soft-tissue]
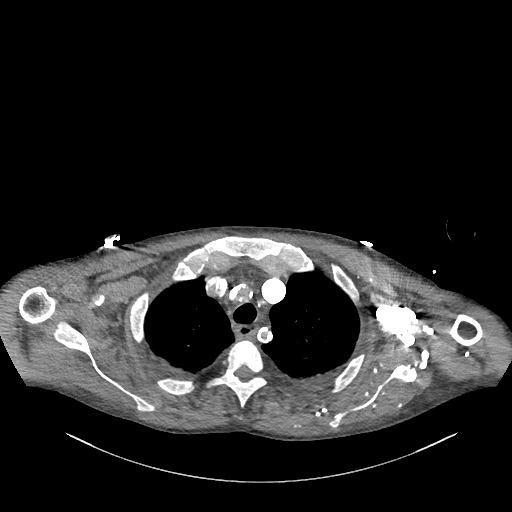
[im 381/457  lung]
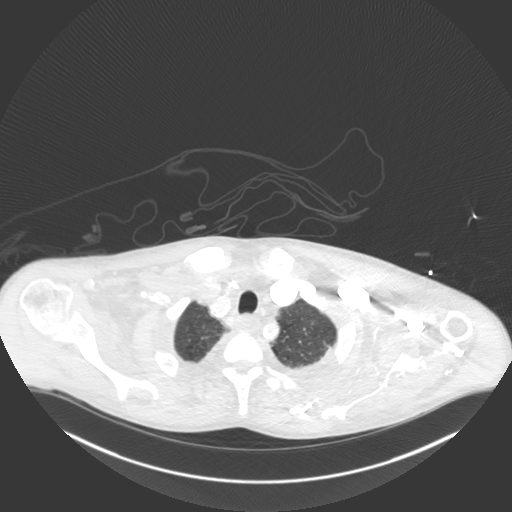
[im 406/457  soft-tissue]
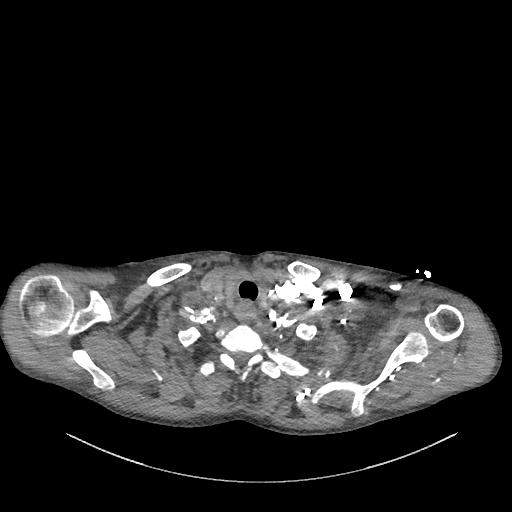
[im 431/457  lung]
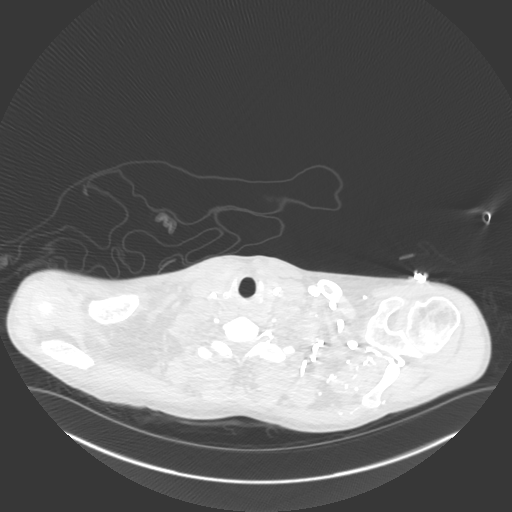

[Series 8: cor · coronal · 0.68mm/px · 3 of 159 slices shown]
[im 32/159  soft-tissue]
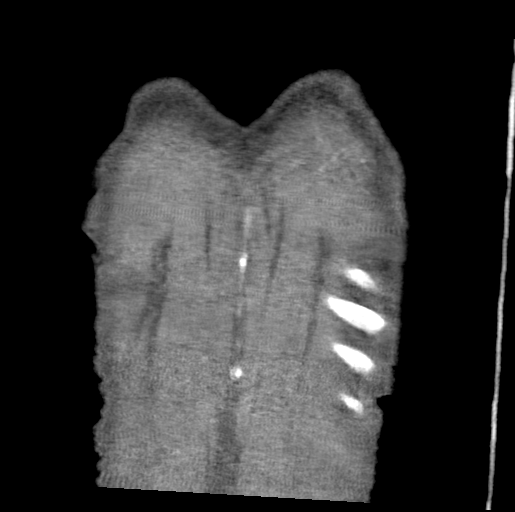
[im 64/159  soft-tissue]
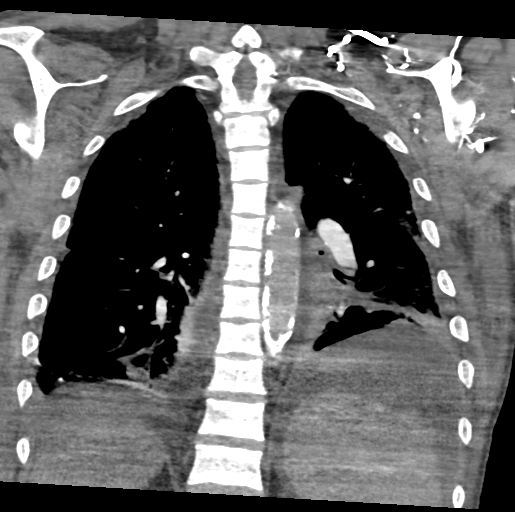
[im 95/159  soft-tissue]
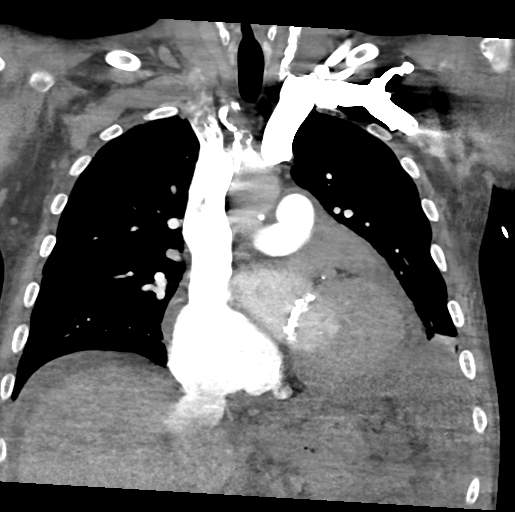

[18 of 46 positions shown; findings below may reference images not displayed]

FINDINGS: Cardiovascular: There is adequate opacification of the pulmonary
arterial tree. No intraluminal filling defect identified to suggest
acute pulmonary embolism. Mild enlargement of the central pulmonary
arteries is present in keeping with changes of pulmonary arterial
hypertension.

There is mild global cardiomegaly with particular enlargement of the
right heart chambers and moderate left ventricular hypertrophy.
Extensive multi-vessel coronary artery calcification. Degenerative
calcification of the mitral valve annulus and aortic valve leaflets
is noted. Trace pericardial effusion is unchanged. Extensive
atherosclerotic calcification is seen within the thoracic aorta
without evidence of aneurysm. Extensive atherosclerotic
calcification is seen involving the arch vasculature at their
origins and throughout their proximal course

Mediastinum/Nodes: No enlarged mediastinal, hilar, or axillary lymph
nodes. Thyroid gland, trachea, and esophagus demonstrate no
significant findings.

Lungs/Pleura: Similar to prior examination, small bilateral pleural
effusions are present with bibasilar atelectasis. No superimposed
focal pulmonary infiltrate. No pneumothorax.

Upper Abdomen: The liver contour is subtly nodular suggesting
changes of cirrhosis. Moderate ascites noted within the visualized
upper abdomen. No acute abnormality.

Musculoskeletal: No acute bone abnormality. No lytic or blastic bone
lesion.

Review of the MIP images confirms the above findings.
IMPRESSION: No acute pulmonary embolism.

Extensive multi-vessel coronary artery calcification.

Mild global cardiomegaly with particular enlargement of the right
heart chambers. Morphologic changes in keeping with pulmonary
arterial hypertension. Left ventricular hypertrophy. Altogether, the
constellation of findings may reflect changes of chronic diastolic
dysfunction.

Peripheral vascular disease with extensive atherosclerotic
calcification involving the thoracic aorta and proximal arch
vasculature.

Small bilateral pleural effusions with stable bibasilar compressive
atelectasis.

Cirrhosis.  Moderate ascites within the upper abdomen.

Aortic Atherosclerosis ([id number]).
